# Patient Record
Sex: Male | Born: 1950
Health system: Southern US, Community
[De-identification: ages and names within clinical notes are randomized; demographics above are authoritative.]

## PROBLEM LIST (undated history)

## (undated) DIAGNOSIS — R600 Localized edema: Secondary | ICD-10-CM

## (undated) DIAGNOSIS — I959 Hypotension, unspecified: Secondary | ICD-10-CM

## (undated) DIAGNOSIS — J189 Pneumonia, unspecified organism: Secondary | ICD-10-CM

## (undated) DIAGNOSIS — R0602 Shortness of breath: Secondary | ICD-10-CM

## (undated) DIAGNOSIS — R32 Unspecified urinary incontinence: Secondary | ICD-10-CM

## (undated) DIAGNOSIS — I739 Peripheral vascular disease, unspecified: Secondary | ICD-10-CM

## (undated) DIAGNOSIS — F329 Major depressive disorder, single episode, unspecified: Secondary | ICD-10-CM

## (undated) DIAGNOSIS — R51 Headache: Secondary | ICD-10-CM

## (undated) DIAGNOSIS — Z9989 Dependence on other enabling machines and devices: Secondary | ICD-10-CM

## (undated) DIAGNOSIS — D62 Acute posthemorrhagic anemia: Secondary | ICD-10-CM

## (undated) DIAGNOSIS — F10239 Alcohol dependence with withdrawal, unspecified: Secondary | ICD-10-CM

## (undated) DIAGNOSIS — Z720 Tobacco use: Secondary | ICD-10-CM

## (undated) DIAGNOSIS — E669 Obesity, unspecified: Secondary | ICD-10-CM

## (undated) DIAGNOSIS — Z96659 Presence of unspecified artificial knee joint: Secondary | ICD-10-CM

## (undated) DIAGNOSIS — E8809 Other disorders of plasma-protein metabolism, not elsewhere classified: Secondary | ICD-10-CM

## (undated) DIAGNOSIS — E119 Type 2 diabetes mellitus without complications: Secondary | ICD-10-CM

## (undated) DIAGNOSIS — S02839A Fracture of medial orbital wall, unspecified side, initial encounter for closed fracture: Secondary | ICD-10-CM

## (undated) DIAGNOSIS — I509 Heart failure, unspecified: Secondary | ICD-10-CM

## (undated) DIAGNOSIS — I2510 Atherosclerotic heart disease of native coronary artery without angina pectoris: Secondary | ICD-10-CM

## (undated) DIAGNOSIS — E1169 Type 2 diabetes mellitus with other specified complication: Secondary | ICD-10-CM

## (undated) DIAGNOSIS — I5033 Acute on chronic diastolic (congestive) heart failure: Secondary | ICD-10-CM

## (undated) DIAGNOSIS — E875 Hyperkalemia: Secondary | ICD-10-CM

## (undated) DIAGNOSIS — I63411 Cerebral infarction due to embolism of right middle cerebral artery: Secondary | ICD-10-CM

## (undated) DIAGNOSIS — F0280 Dementia in other diseases classified elsewhere without behavioral disturbance: Secondary | ICD-10-CM

## (undated) DIAGNOSIS — I639 Cerebral infarction, unspecified: Secondary | ICD-10-CM

## (undated) DIAGNOSIS — Z86718 Personal history of other venous thrombosis and embolism: Secondary | ICD-10-CM

## (undated) DIAGNOSIS — M25512 Pain in left shoulder: Secondary | ICD-10-CM

## (undated) DIAGNOSIS — H05429 Enophthalmos due to trauma or surgery, unspecified eye: Secondary | ICD-10-CM

## (undated) DIAGNOSIS — I63231 Cerebral infarction due to unspecified occlusion or stenosis of right carotid arteries: Secondary | ICD-10-CM

## (undated) DIAGNOSIS — E46 Unspecified protein-calorie malnutrition: Secondary | ICD-10-CM

## (undated) DIAGNOSIS — H02059 Trichiasis without entropian unspecified eye, unspecified eyelid: Secondary | ICD-10-CM

## (undated) DIAGNOSIS — H029 Unspecified disorder of eyelid: Secondary | ICD-10-CM

## (undated) DIAGNOSIS — R569 Unspecified convulsions: Secondary | ICD-10-CM

## (undated) DIAGNOSIS — F419 Anxiety disorder, unspecified: Secondary | ICD-10-CM

## (undated) DIAGNOSIS — E785 Hyperlipidemia, unspecified: Secondary | ICD-10-CM

## (undated) DIAGNOSIS — K219 Gastro-esophageal reflux disease without esophagitis: Secondary | ICD-10-CM

## (undated) DIAGNOSIS — G4733 Obstructive sleep apnea (adult) (pediatric): Secondary | ICD-10-CM

## (undated) DIAGNOSIS — I10 Essential (primary) hypertension: Secondary | ICD-10-CM

## (undated) DIAGNOSIS — S0285XA Fracture of orbit, unspecified, initial encounter for closed fracture: Secondary | ICD-10-CM

## (undated) DIAGNOSIS — N179 Acute kidney failure, unspecified: Secondary | ICD-10-CM

## (undated) DIAGNOSIS — I6330 Cerebral infarction due to thrombosis of unspecified cerebral artery: Secondary | ICD-10-CM

## (undated) DIAGNOSIS — H5712 Ocular pain, left eye: Secondary | ICD-10-CM

## (undated) DIAGNOSIS — M1990 Unspecified osteoarthritis, unspecified site: Secondary | ICD-10-CM

## (undated) DIAGNOSIS — R0989 Other specified symptoms and signs involving the circulatory and respiratory systems: Secondary | ICD-10-CM

## (undated) DIAGNOSIS — G9340 Encephalopathy, unspecified: Secondary | ICD-10-CM

## (undated) DIAGNOSIS — I82409 Acute embolism and thrombosis of unspecified deep veins of unspecified lower extremity: Secondary | ICD-10-CM

## (undated) DIAGNOSIS — R0609 Other forms of dyspnea: Secondary | ICD-10-CM

## (undated) DIAGNOSIS — E1142 Type 2 diabetes mellitus with diabetic polyneuropathy: Secondary | ICD-10-CM

## (undated) DIAGNOSIS — S0230XA Fracture of orbital floor, unspecified side, initial encounter for closed fracture: Secondary | ICD-10-CM

## (undated) DIAGNOSIS — F32A Depression, unspecified: Secondary | ICD-10-CM

## (undated) DIAGNOSIS — H532 Diplopia: Secondary | ICD-10-CM

## (undated) DIAGNOSIS — I63511 Cerebral infarction due to unspecified occlusion or stenosis of right middle cerebral artery: Secondary | ICD-10-CM

## (undated) DIAGNOSIS — J449 Chronic obstructive pulmonary disease, unspecified: Secondary | ICD-10-CM

## (undated) DIAGNOSIS — D696 Thrombocytopenia, unspecified: Secondary | ICD-10-CM

## (undated) DIAGNOSIS — I219 Acute myocardial infarction, unspecified: Secondary | ICD-10-CM

## (undated) DIAGNOSIS — F1010 Alcohol abuse, uncomplicated: Secondary | ICD-10-CM

## (undated) HISTORY — DX: Peripheral vascular disease, unspecified: I73.9

## (undated) HISTORY — DX: Depression, unspecified: F32.A

## (undated) HISTORY — DX: Atherosclerotic heart disease of native coronary artery without angina pectoris: I25.10

## (undated) HISTORY — DX: Acute myocardial infarction, unspecified: I21.9

## (undated) HISTORY — PX: EYE SURGERY: SHX253

## (undated) HISTORY — DX: Personal history of other venous thrombosis and embolism: Z86.718

## (undated) HISTORY — DX: Tobacco use: Z72.0

## (undated) HISTORY — DX: Type 2 diabetes mellitus without complications: E11.9

## (undated) HISTORY — DX: Fracture of orbit, unspecified, initial encounter for closed fracture: S02.85XA

## (undated) HISTORY — DX: Gastro-esophageal reflux disease without esophagitis: K21.9

## (undated) HISTORY — DX: Essential (primary) hypertension: I10

## (undated) HISTORY — PX: ESOPHAGOGASTRIC FUNDOPLICATION: SHX405

## (undated) HISTORY — PX: TOTAL KNEE ARTHROPLASTY: SHX125

## (undated) HISTORY — DX: Obstructive sleep apnea (adult) (pediatric): G47.33

## (undated) HISTORY — DX: Chronic obstructive pulmonary disease, unspecified: J44.9

## (undated) HISTORY — DX: Alcohol abuse, uncomplicated: F10.10

## (undated) HISTORY — DX: Anxiety disorder, unspecified: F41.9

## (undated) HISTORY — PX: BACK SURGERY: SHX140

## (undated) HISTORY — DX: Hyperlipidemia, unspecified: E78.5

## (undated) HISTORY — DX: Major depressive disorder, single episode, unspecified: F32.9

## (undated) HISTORY — DX: Dependence on other enabling machines and devices: Z99.89

## (undated) HISTORY — PX: CHOLECYSTECTOMY: SHX55

## (undated) HISTORY — DX: Heart failure, unspecified: I50.9

## (undated) HISTORY — PX: JOINT REPLACEMENT: SHX530

## (undated) HISTORY — PX: OTHER SURGICAL HISTORY: SHX169

---

## 1998-02-09 DIAGNOSIS — I219 Acute myocardial infarction, unspecified: Secondary | ICD-10-CM

## 1998-02-09 HISTORY — DX: Acute myocardial infarction, unspecified: I21.9

## 2001-10-11 ENCOUNTER — Ambulatory Visit (HOSPITAL_COMMUNITY): Admission: RE | Admit: 2001-10-11 | Discharge: 2001-10-11 | Payer: Self-pay | Admitting: Internal Medicine

## 2001-10-27 ENCOUNTER — Ambulatory Visit (HOSPITAL_COMMUNITY): Admission: RE | Admit: 2001-10-27 | Discharge: 2001-10-27 | Payer: Self-pay | Admitting: Internal Medicine

## 2001-10-27 ENCOUNTER — Encounter: Payer: Self-pay | Admitting: Internal Medicine

## 2001-10-28 ENCOUNTER — Encounter: Payer: Self-pay | Admitting: Internal Medicine

## 2001-11-03 ENCOUNTER — Encounter: Payer: Self-pay | Admitting: Vascular Surgery

## 2001-11-07 ENCOUNTER — Ambulatory Visit (HOSPITAL_COMMUNITY): Admission: RE | Admit: 2001-11-07 | Discharge: 2001-11-07 | Payer: Self-pay | Admitting: Vascular Surgery

## 2001-11-29 ENCOUNTER — Ambulatory Visit (HOSPITAL_COMMUNITY): Admission: RE | Admit: 2001-11-29 | Discharge: 2001-11-29 | Payer: Self-pay | Admitting: Cardiovascular Disease

## 2001-11-29 ENCOUNTER — Encounter: Payer: Self-pay | Admitting: Cardiovascular Disease

## 2001-12-20 ENCOUNTER — Inpatient Hospital Stay (HOSPITAL_COMMUNITY): Admission: RE | Admit: 2001-12-20 | Discharge: 2001-12-25 | Payer: Self-pay | Admitting: Vascular Surgery

## 2001-12-20 ENCOUNTER — Encounter: Payer: Self-pay | Admitting: Vascular Surgery

## 2001-12-20 ENCOUNTER — Encounter (INDEPENDENT_AMBULATORY_CARE_PROVIDER_SITE_OTHER): Payer: Self-pay | Admitting: Specialist

## 2001-12-21 ENCOUNTER — Encounter: Payer: Self-pay | Admitting: Vascular Surgery

## 2002-02-08 ENCOUNTER — Encounter: Payer: Self-pay | Admitting: Emergency Medicine

## 2002-02-08 ENCOUNTER — Encounter: Payer: Self-pay | Admitting: Internal Medicine

## 2002-02-08 ENCOUNTER — Inpatient Hospital Stay (HOSPITAL_COMMUNITY): Admission: EM | Admit: 2002-02-08 | Discharge: 2002-02-10 | Payer: Self-pay

## 2002-02-10 ENCOUNTER — Inpatient Hospital Stay (HOSPITAL_COMMUNITY): Admission: EM | Admit: 2002-02-10 | Discharge: 2002-02-15 | Payer: Self-pay | Admitting: Psychiatry

## 2002-12-12 ENCOUNTER — Emergency Department (HOSPITAL_COMMUNITY): Admission: EM | Admit: 2002-12-12 | Discharge: 2002-12-13 | Payer: Self-pay | Admitting: Emergency Medicine

## 2003-07-29 ENCOUNTER — Emergency Department (HOSPITAL_COMMUNITY): Admission: EM | Admit: 2003-07-29 | Discharge: 2003-07-29 | Payer: Self-pay | Admitting: Emergency Medicine

## 2006-11-14 ENCOUNTER — Inpatient Hospital Stay (HOSPITAL_COMMUNITY): Admission: EM | Admit: 2006-11-14 | Discharge: 2006-11-16 | Payer: Self-pay | Admitting: Emergency Medicine

## 2007-10-15 ENCOUNTER — Emergency Department (HOSPITAL_COMMUNITY): Admission: EM | Admit: 2007-10-15 | Discharge: 2007-10-16 | Payer: Self-pay | Admitting: Emergency Medicine

## 2008-01-25 ENCOUNTER — Ambulatory Visit: Payer: Self-pay | Admitting: Psychiatry

## 2008-01-25 ENCOUNTER — Inpatient Hospital Stay (HOSPITAL_COMMUNITY): Admission: EM | Admit: 2008-01-25 | Discharge: 2008-02-01 | Payer: Self-pay | Admitting: Psychiatry

## 2008-01-25 ENCOUNTER — Encounter: Payer: Self-pay | Admitting: Emergency Medicine

## 2008-04-16 ENCOUNTER — Emergency Department (HOSPITAL_COMMUNITY): Admission: EM | Admit: 2008-04-16 | Discharge: 2008-04-16 | Payer: Self-pay | Admitting: Emergency Medicine

## 2008-08-16 IMAGING — CT CT ANGIO CHEST
3 of 6 series · 19 of 36 positions shown · IV contrast (APPLIED)
Comparison: None.

CLINICAL DATA: Chest pain.  
CT ANGIOGRAPHY OF CHEST:
TECHNIQUE: Multidetector CT imaging of the chest was performed during bolus injection of intravenous contrast.  Multiplanar CT angiographic image reconstructions were generated to evaluate the vascular anatomy.
Contrast:  80 ml Omnipaque 300

[Series 5: pulm embolism 2.0 b31f st · axial · 0.68mm/px · z∈[-312,-32]mm · 11 of 169 slices shown]
[im 15/169  lung]
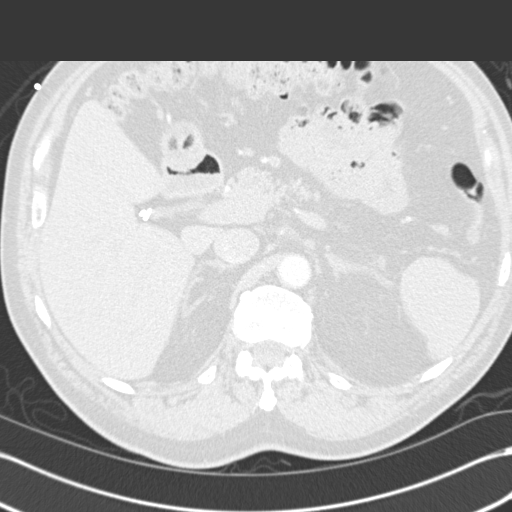
[im 29/169  mediastinal]
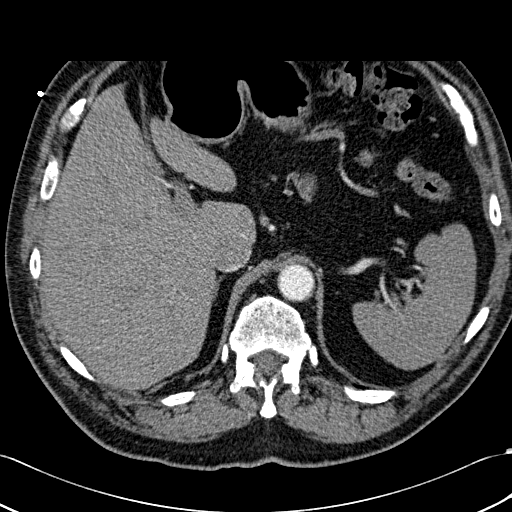
[im 43/169  lung]
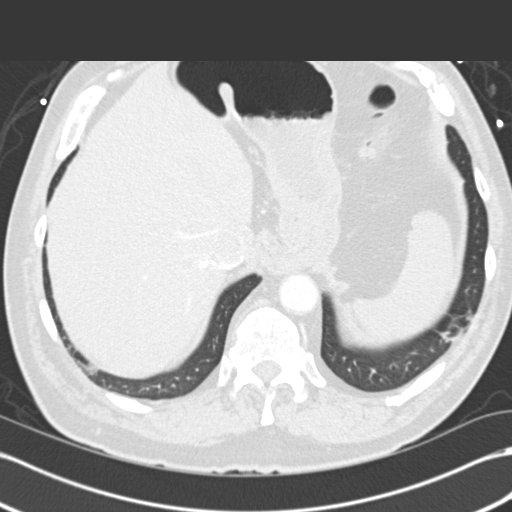
[im 57/169  mediastinal]
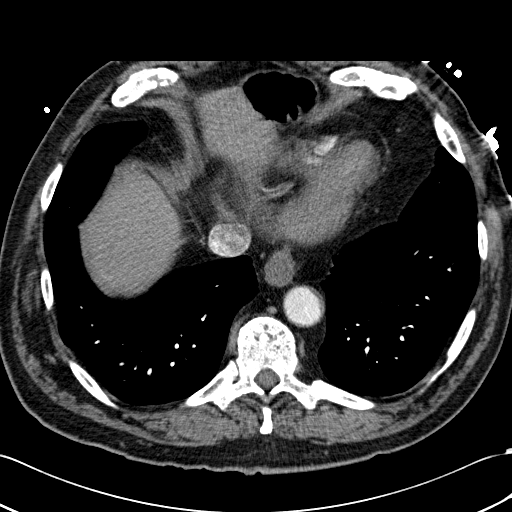
[im 71/169  lung]
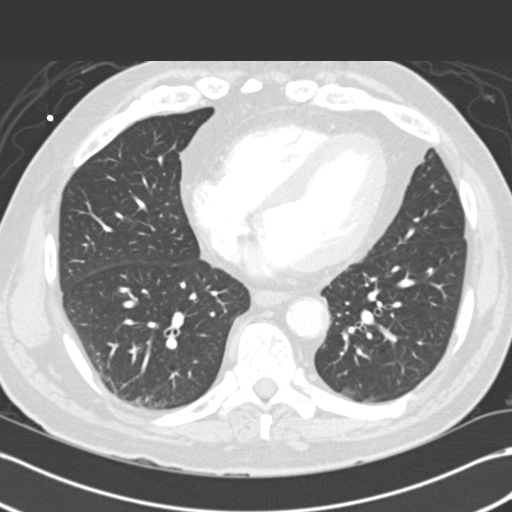
[im 85/169  mediastinal]
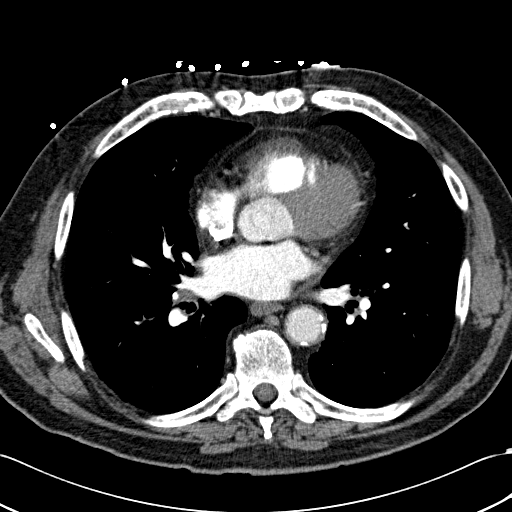
[im 99/169  lung]
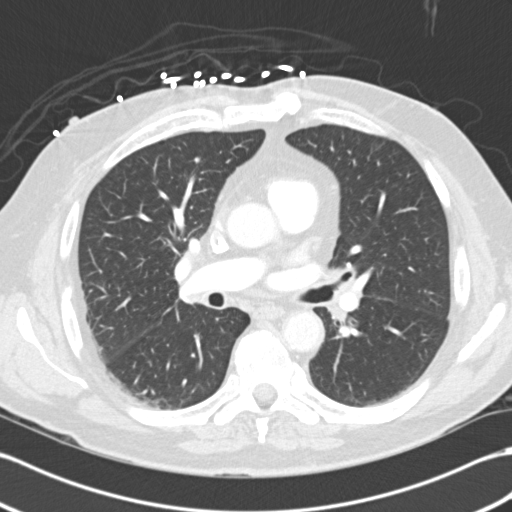
[im 113/169  mediastinal]
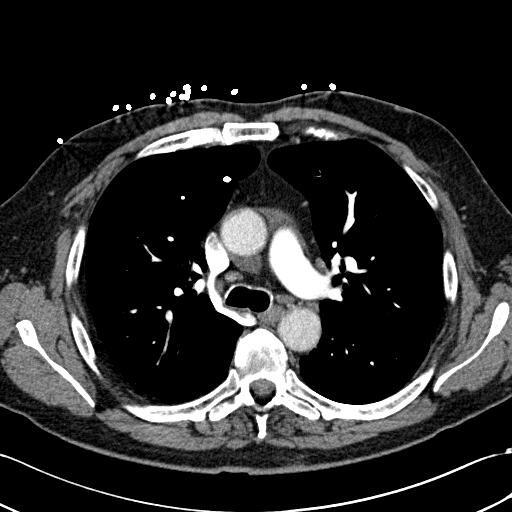
[im 127/169  lung]
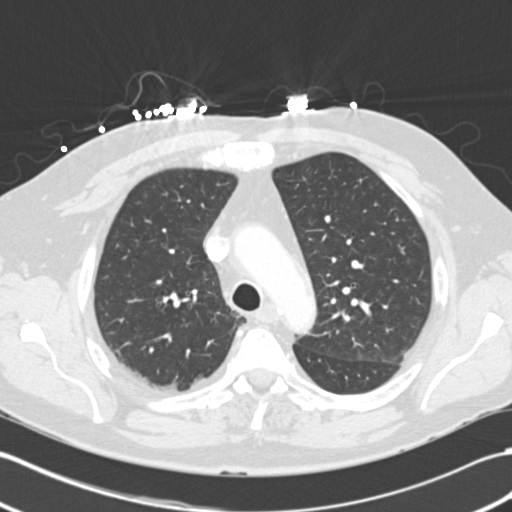
[im 141/169  mediastinal]
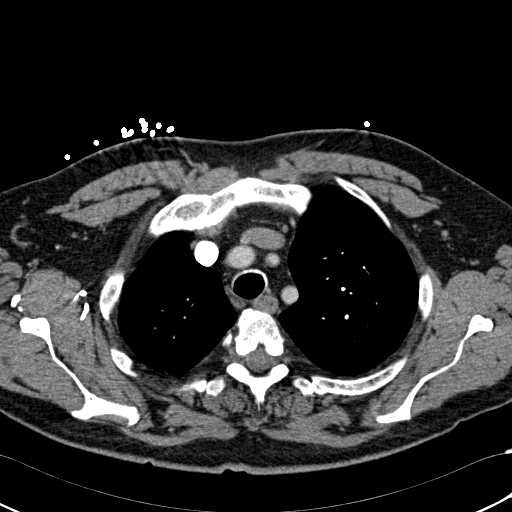
[im 155/169  lung]
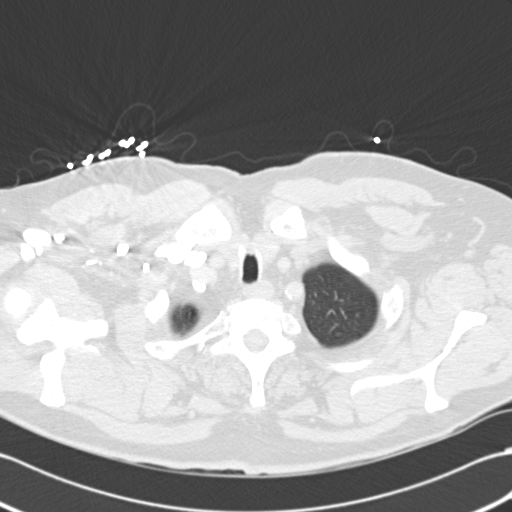

[Series 6: pulm embolism 2.0 b60f lung · axial · 0.68mm/px · z∈[-244,-124]mm · 5 of 136 slices shown]
[im 16/136  mediastinal]
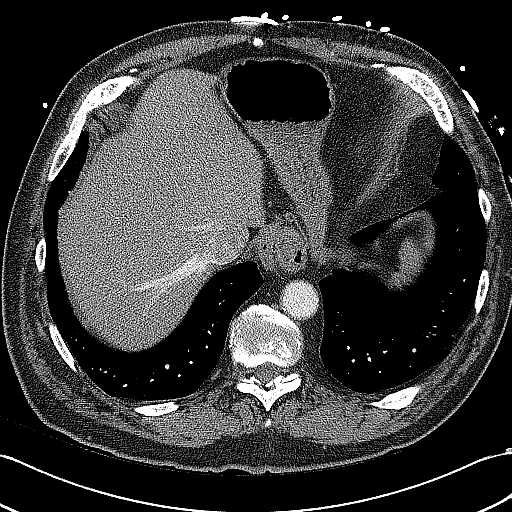
[im 31/136  mediastinal]
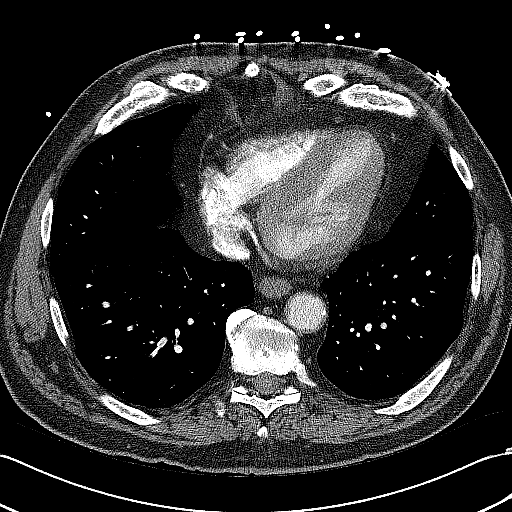
[im 46/136  mediastinal]
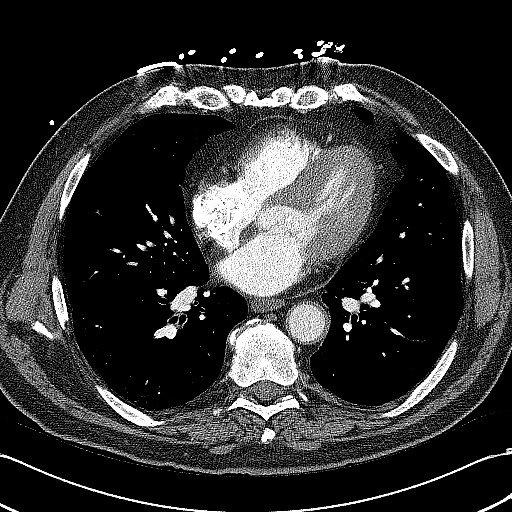
[im 61/136  mediastinal]
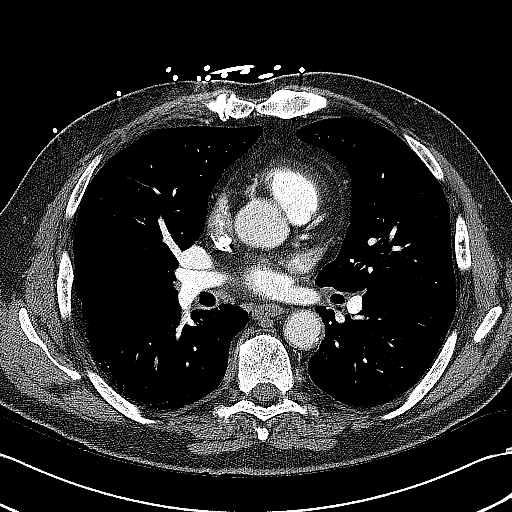
[im 76/136  mediastinal]
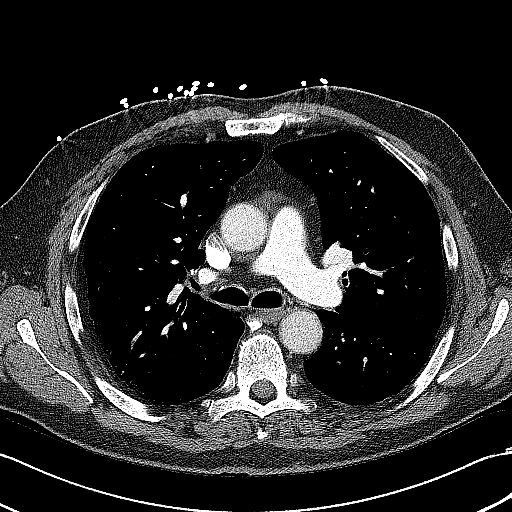

[Series 603: coronals · coronal · 0.68mm/px · 3 of 117 slices shown]
[im 24/117  mediastinal]
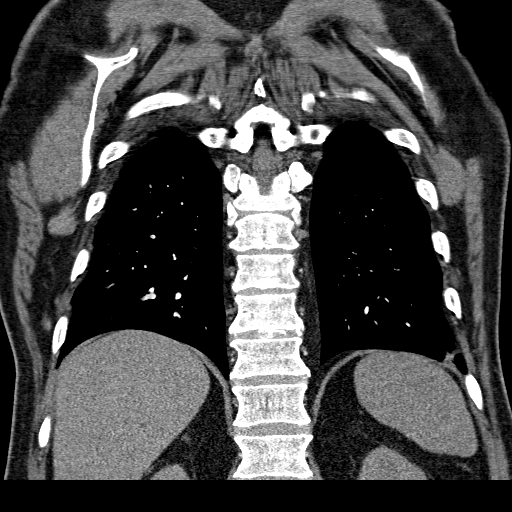
[im 47/117  mediastinal]
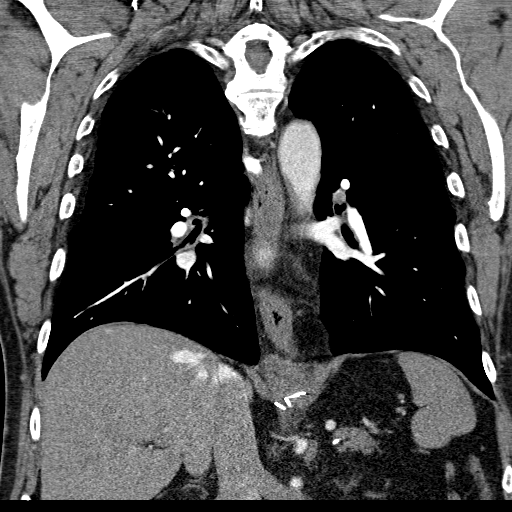
[im 70/117  mediastinal]
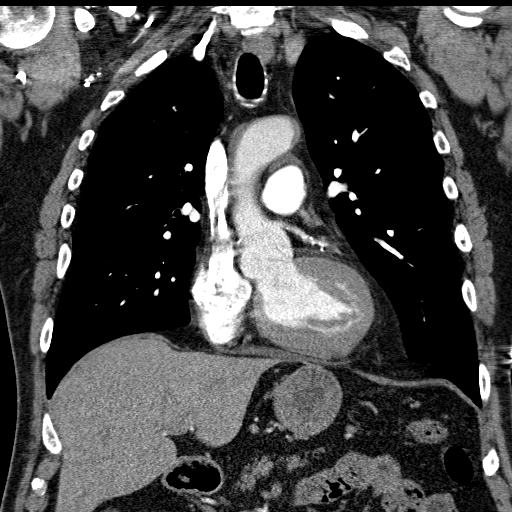

[19 of 36 positions shown; findings below may reference images not displayed]

FINDINGS: imaging of the upper abdomen demonstrates cholecystectomy.  No acute intraabdominal findings.  Aortic atherosclerosis is present.  Surgery of the gastroesophageal junction has been performed, with an esophageal wrap.  No axillary, hilar, or mediastinal adenopathy is identified.  Calcified lymph nodes are identified within the right hilum, and a large calcified granuloma is identified within the right upper lobe, all consistent with old granulomatous disease.  Aortic arch and branch vessel atherosclerosis.  No acute aortic findings are identified.  Prominence of the superior pericardial recess.  
This study is technically adequate to evaluate for pulmonary embolus and no pulmonary embolus is present.  Coronary artery atherosclerosis is present.  No pericardial or pleural effusion. 
Bone windows demonstrate no aggressive-appearing osteolytic or blastic lesions.  
Lung windows demonstrate dependent atelectasis.  Area of ill-defined nodularity or scar is identified in the left upper lobe on image 63 series 6, measuring 6 mm.  Scattered other sub 4 mm pulmonary nodules are identified.
IMPRESSION: 1.  Technically adequate study without pulmonary embolus. 
2.  Atherosclerosis and coronary artery disease, which could potentially represent a source of disease in the chest. 
3.  Ill-defined pulmonary nodule measuring 6 mm in the left upper lobe.  6-month followup recommended in patient with high-risk of malignancy or cigarette smoker with 12-month followup in low risk patient. 
4.  Old granulomatous disease.

## 2008-08-16 IMAGING — CR DG CHEST 1V PORT
1 series · 1 of 1 positions shown · non-contrast
Comparison: None available.

CLINICAL DATA: Chest pain.
 PORTABLE CHEST - 1 VIEW:

[view not recorded]
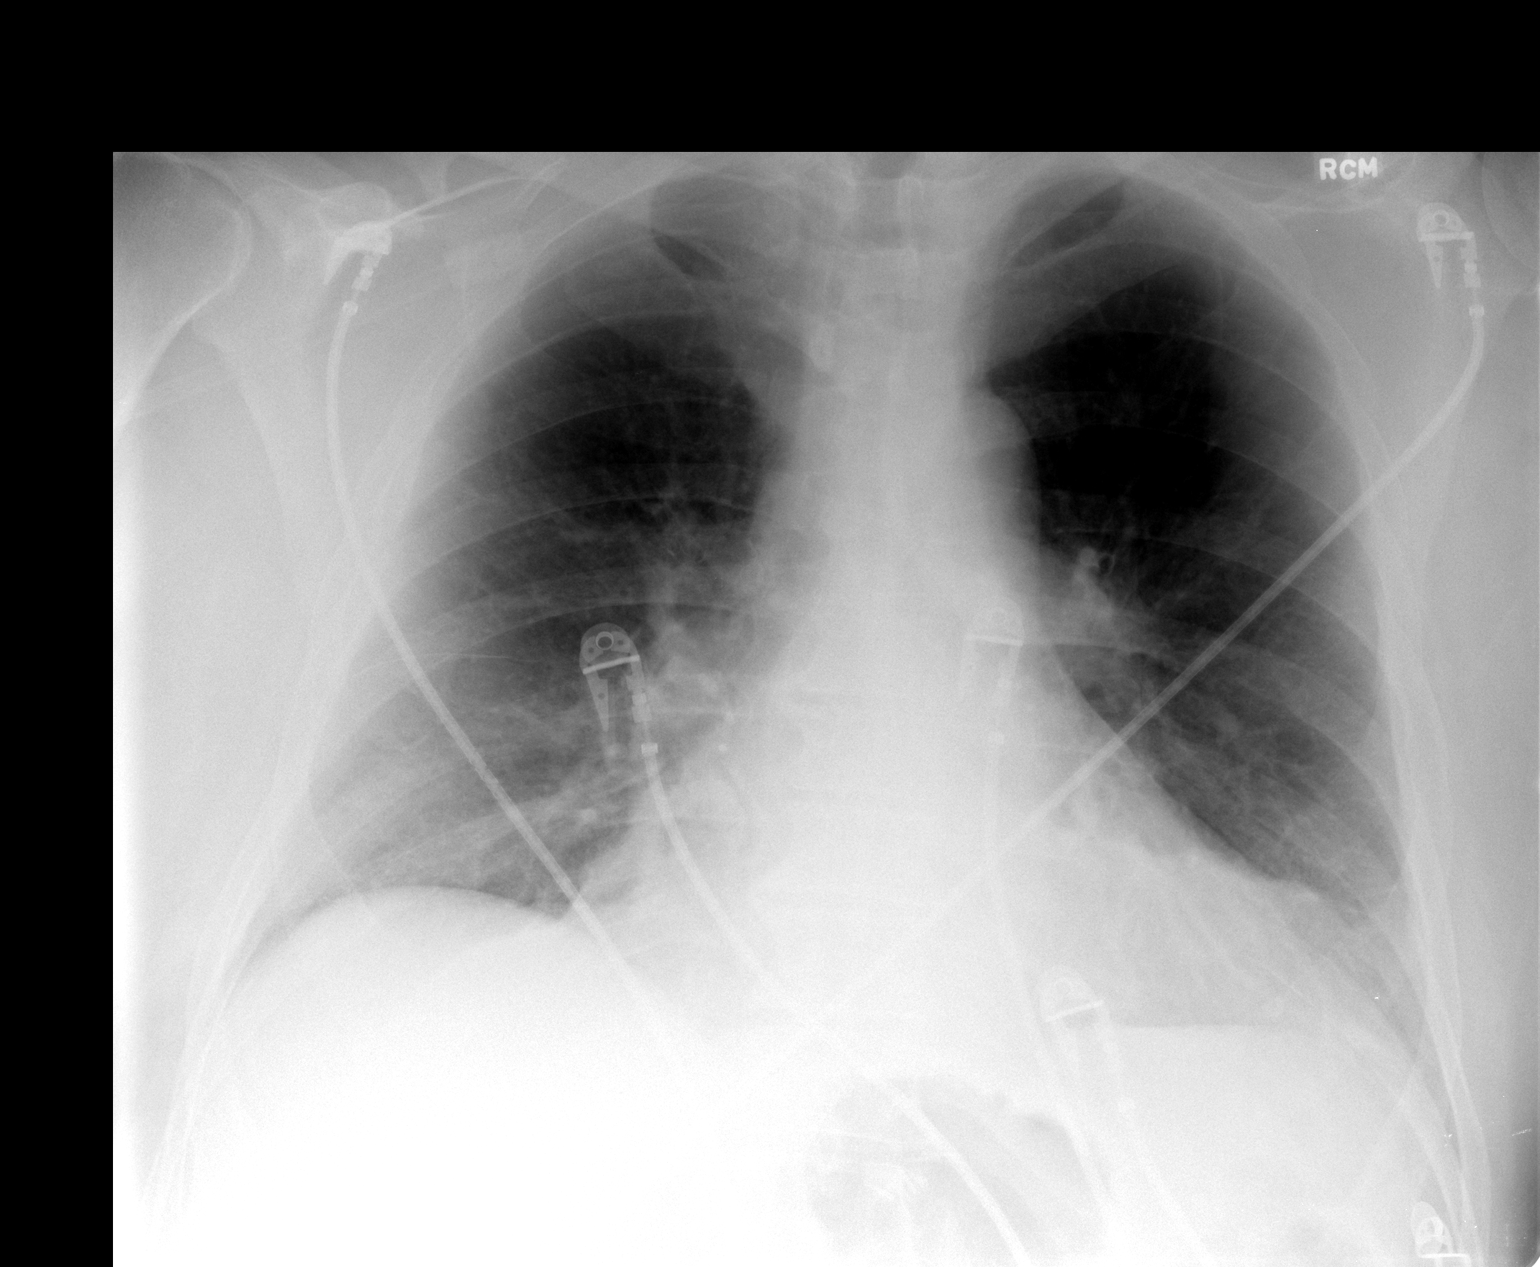

[1 of 1 positions shown; findings below may reference images not displayed]

FINDINGS: AP portable upright chest demonstrates cardiomegaly. Bibasilar atelectasis is present. No edema or focal airspace disease identified. Degenerative changes noted at the shoulders.
IMPRESSION: 1.  Left basilar atelectasis greater than right, without acute cardiopulmonary disease.
 2.  Cardiomegaly.

## 2009-07-18 IMAGING — CR DG CERVICAL SPINE COMPLETE 4+V
8 series · 8 of 8 positions shown · non-contrast
Comparison: None

CLINICAL DATA: Neck pain

CERVICAL SPINE - COMPLETE 4+ VIEW

[w c-spine lat *]
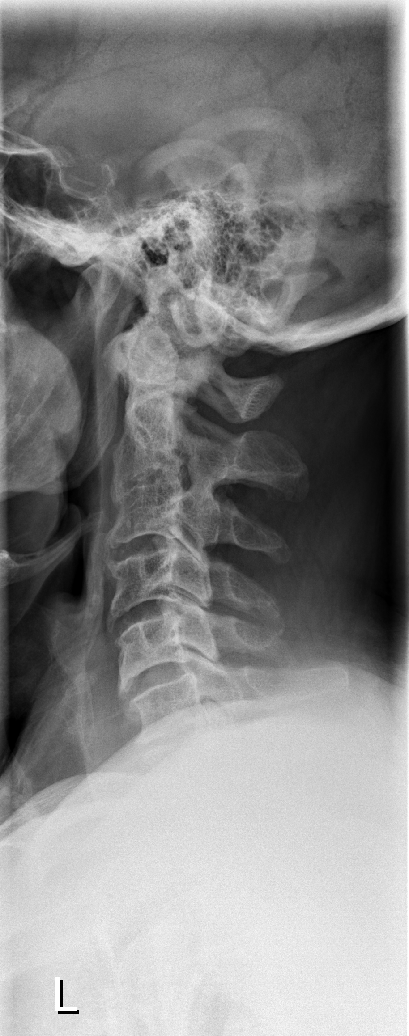

[w c-spine oblique * (1 of 2)]
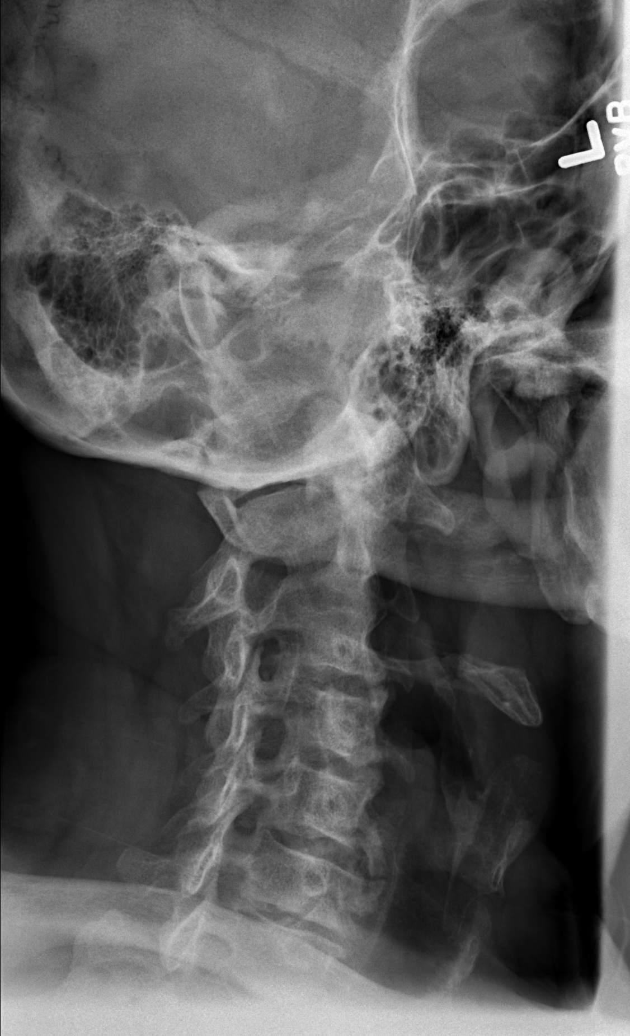

[w c-spine oblique * (2 of 2)]
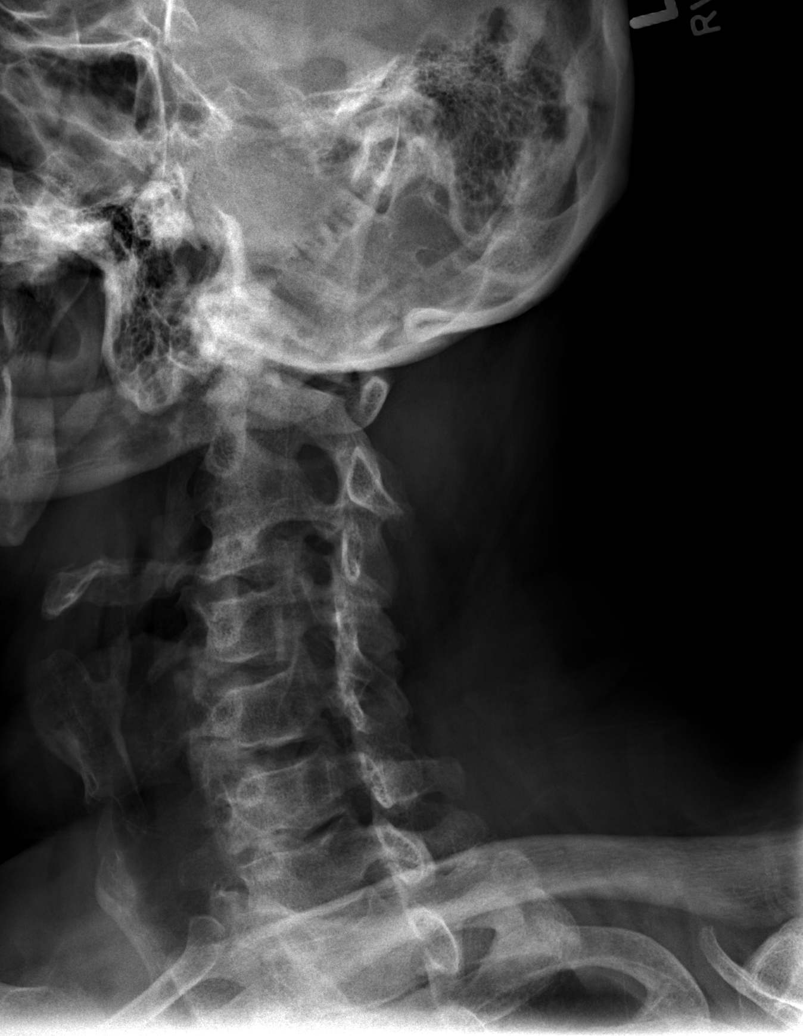

[w c-spine a.p. *]
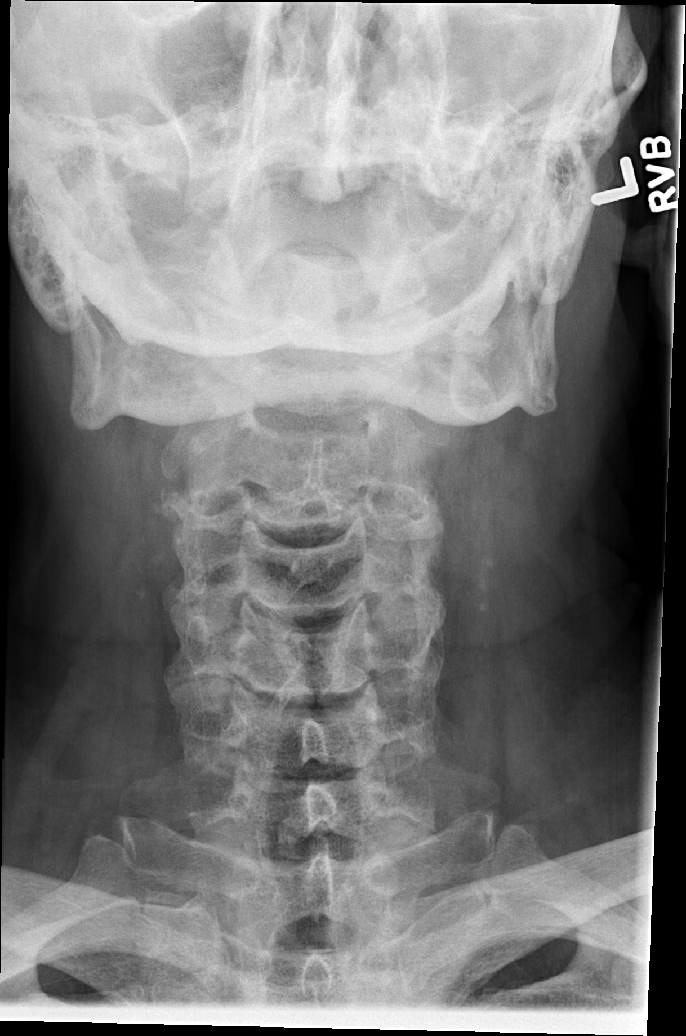

[w c-spine odontoid * (1 of 2)]
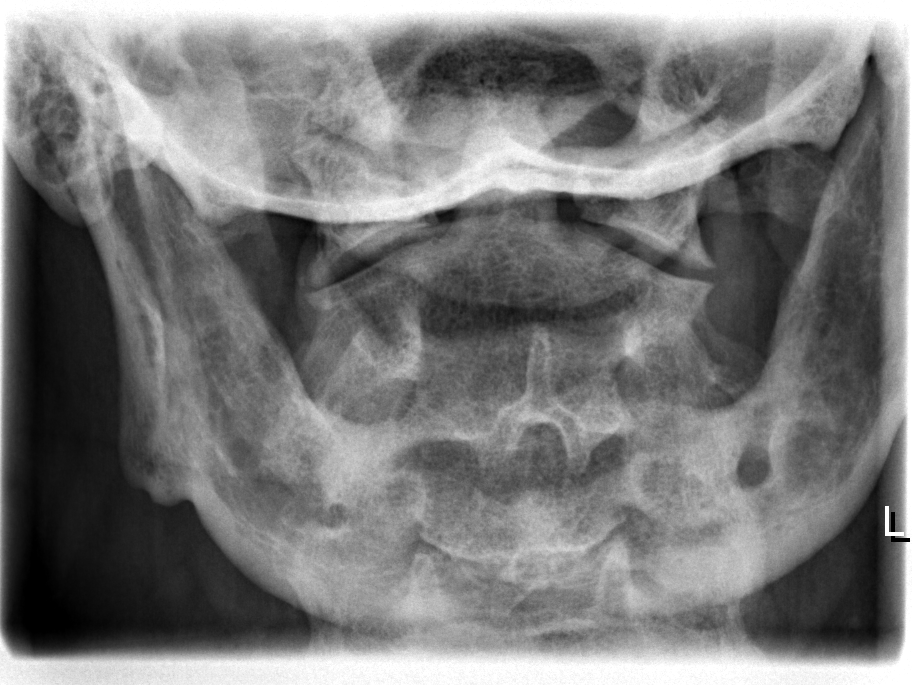

[w c-spine odontoid * (2 of 2)]
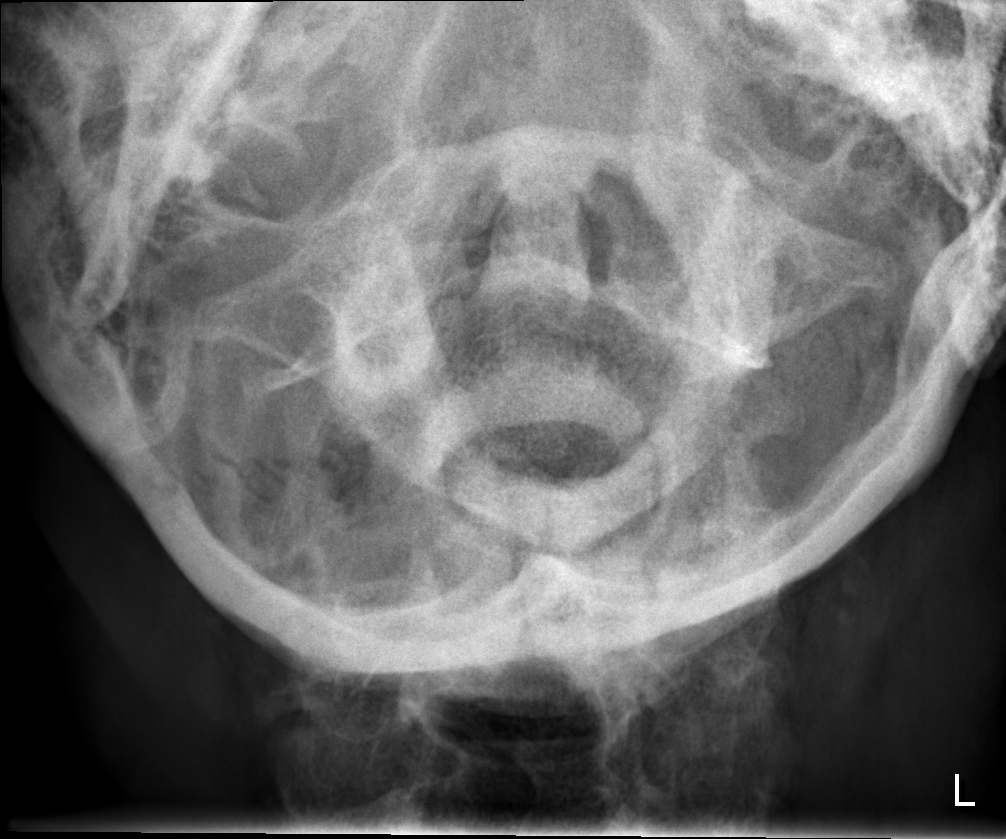

[w swimmers view * (1 of 2)]
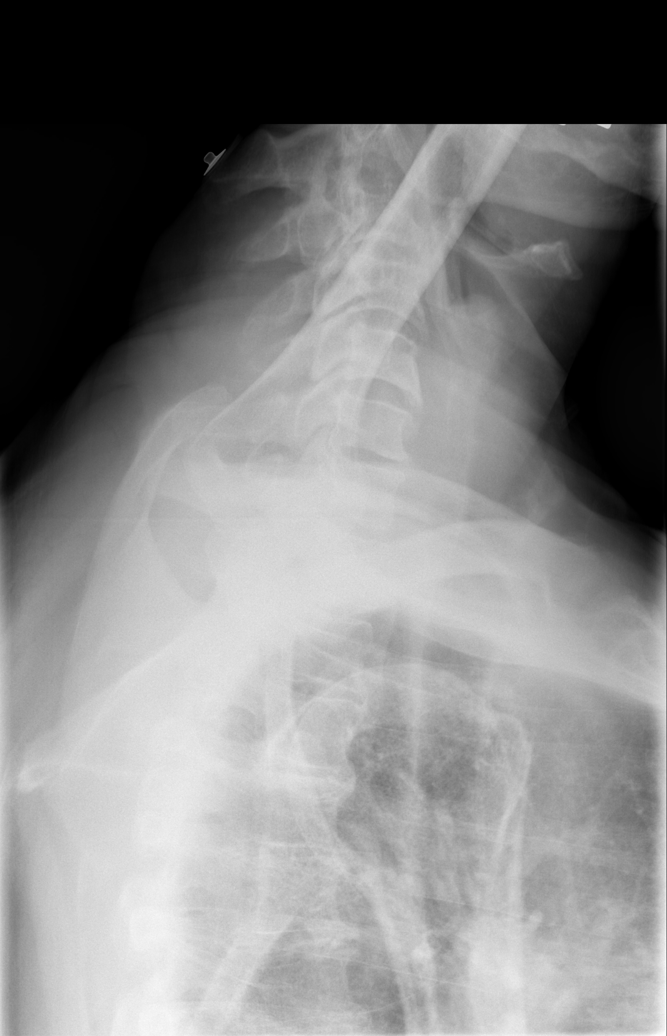

[w swimmers view * (2 of 2)]
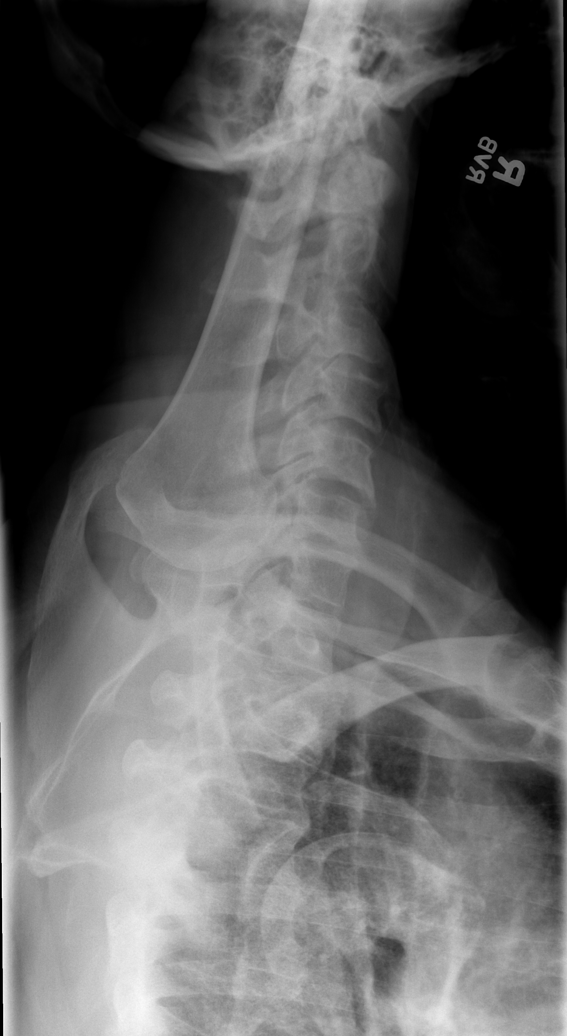

[8 of 8 positions shown; findings below may reference images not displayed]

FINDINGS: Alignment is normal.  There appears to be congenital
failure of separation of C2 and C3.  There is degenerative facet
arthropathy at C3-4 and C4-5.  There is mild foraminal encroachment
at those two levels due to osteophytes.  No severe degenerative
changes are noted.  There is moderate disc space narrowing at C6-7.
IMPRESSION: Congenital failure of separation of C2 and C3.  Facet arthropathy
at C3-4 and C4-5 and disc space narrowing at C6-7.

## 2009-08-13 ENCOUNTER — Emergency Department (HOSPITAL_COMMUNITY): Admission: EM | Admit: 2009-08-13 | Discharge: 2009-08-13 | Payer: Self-pay | Admitting: Emergency Medicine

## 2009-10-09 ENCOUNTER — Encounter: Admission: RE | Admit: 2009-10-09 | Discharge: 2009-10-09 | Payer: Self-pay | Admitting: Family Medicine

## 2009-10-28 IMAGING — CT CT HEAD W/O CM
1 of 2 series · 13 of 30 positions shown, 17 images · non-contrast
Comparison: None available.

CLINICAL DATA: Altered mental status.

CT HEAD WITHOUT CONTRAST
TECHNIQUE: Contiguous axial images were obtained from the base of
the skull through the vertex without contrast.

[Series 2: brain · axial · 0.47mm/px · z∈[+158,+290]mm · 13 of 32 slices shown, 17 images]
[im 3/32  brain]
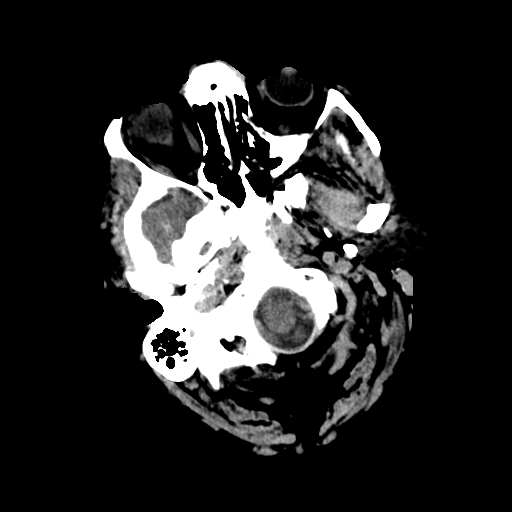
[im 3/32  bone]
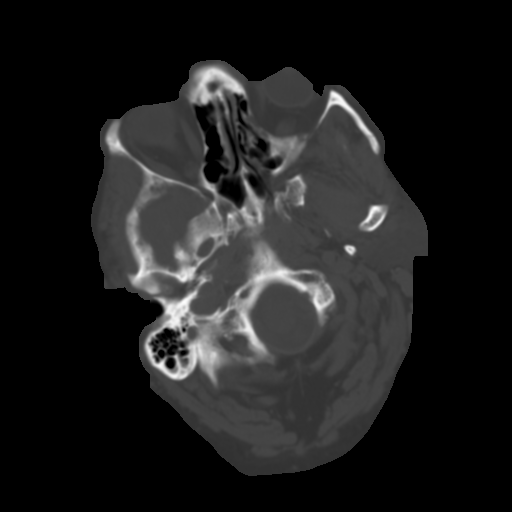
[im 5/32  brain]
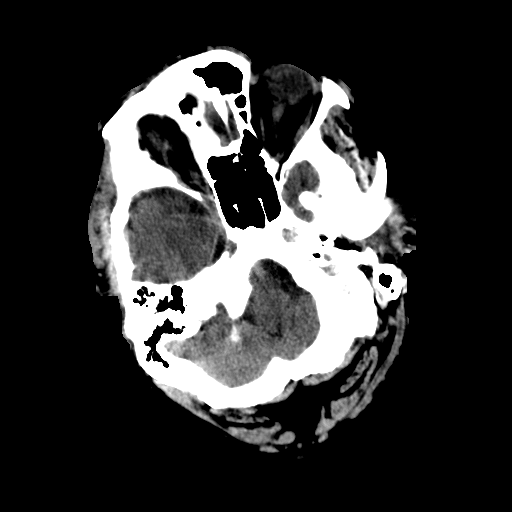
[im 7/32  brain]
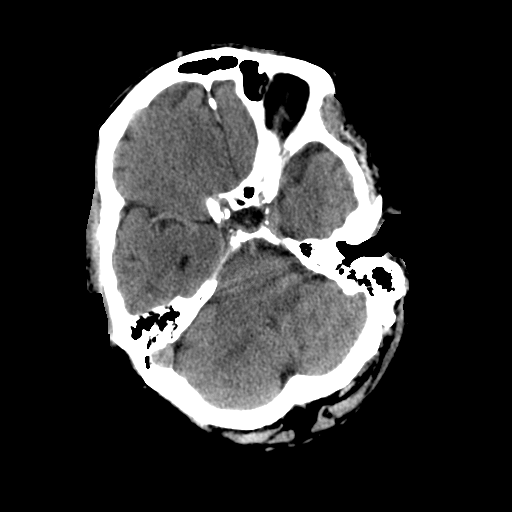
[im 9/32  brain]
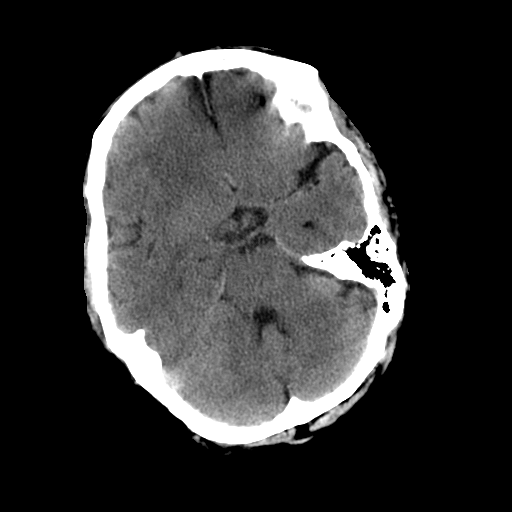
[im 12/32  brain]
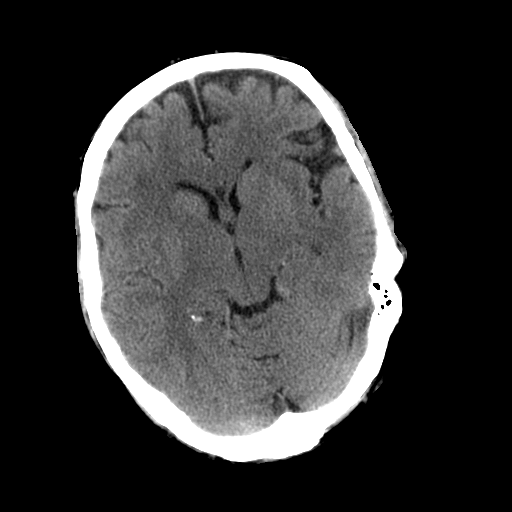
[im 12/32  bone]
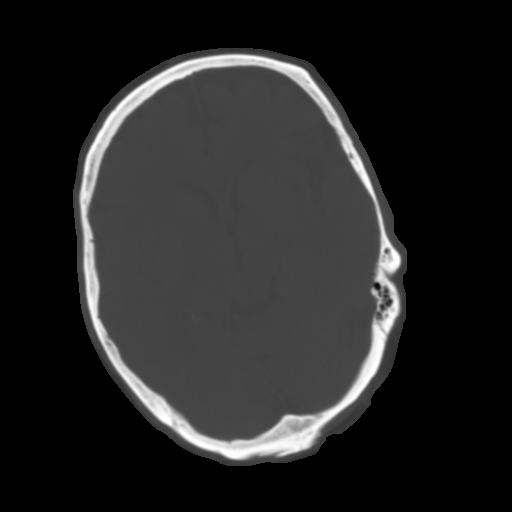
[im 14/32  brain]
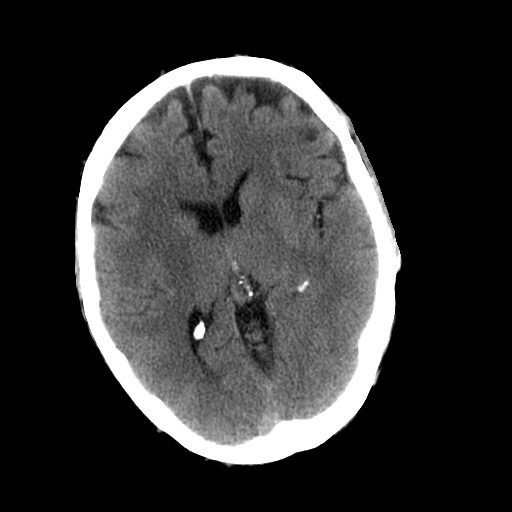
[im 16/32  brain]
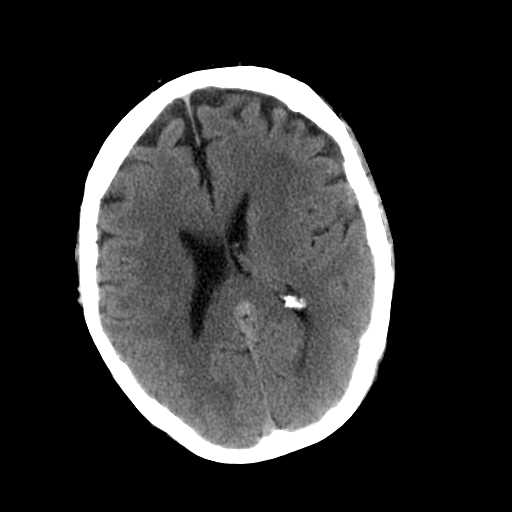
[im 18/32  brain]
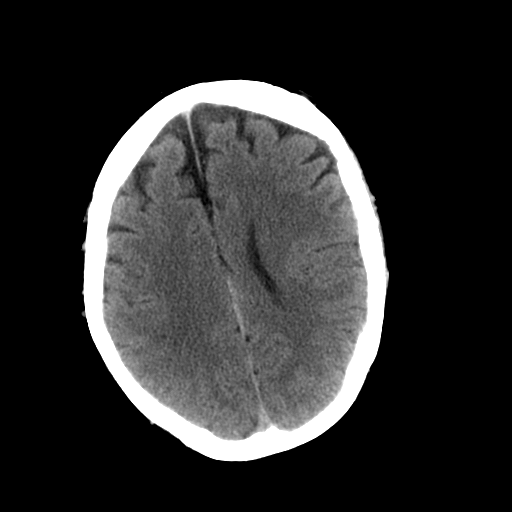
[im 20/32  brain]
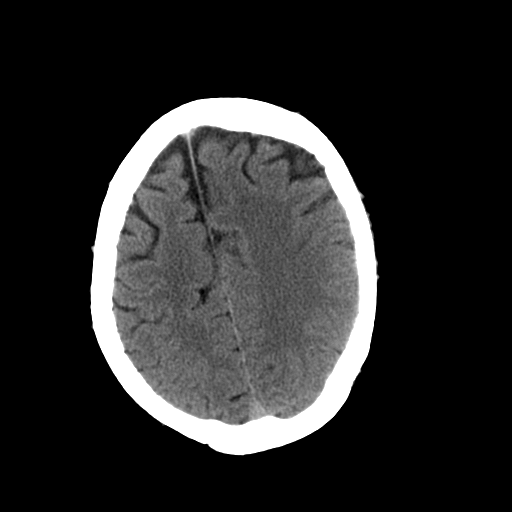
[im 20/32  bone]
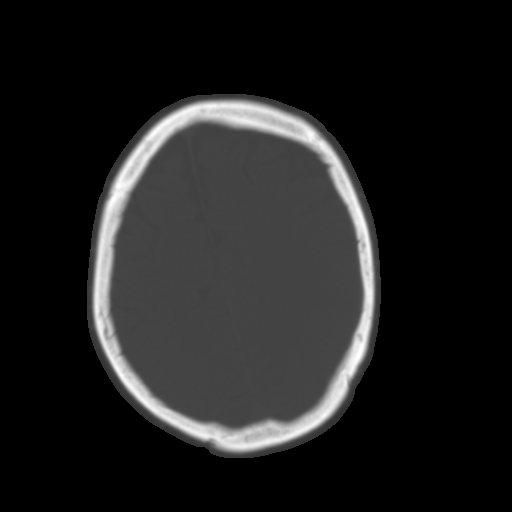
[im 23/32  brain]
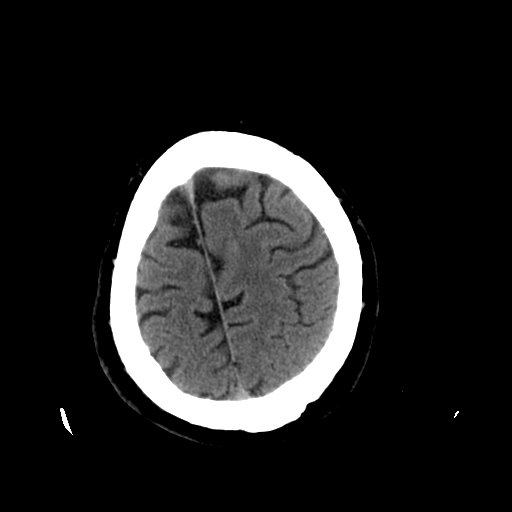
[im 25/32  brain]
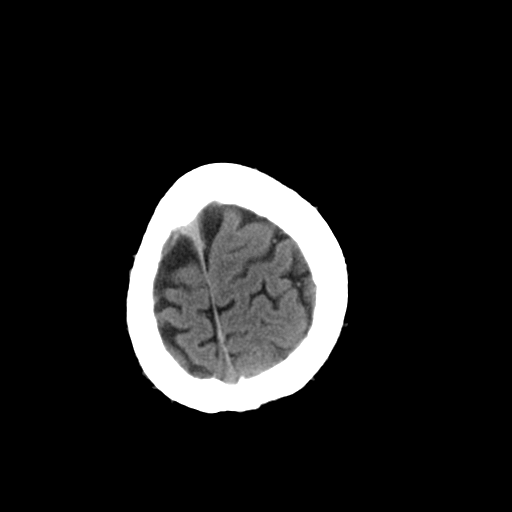
[im 27/32  brain]
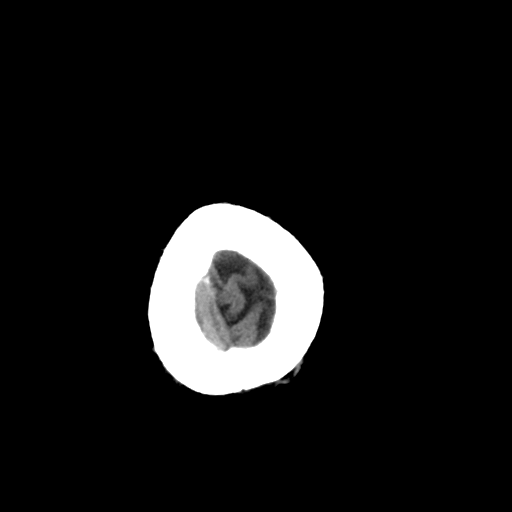
[im 29/32  brain]
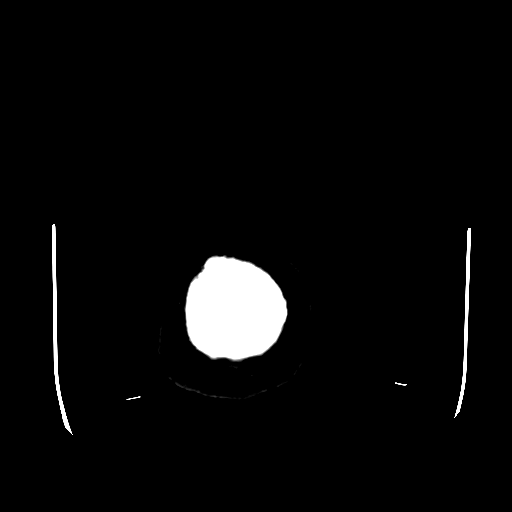
[im 29/32  bone]
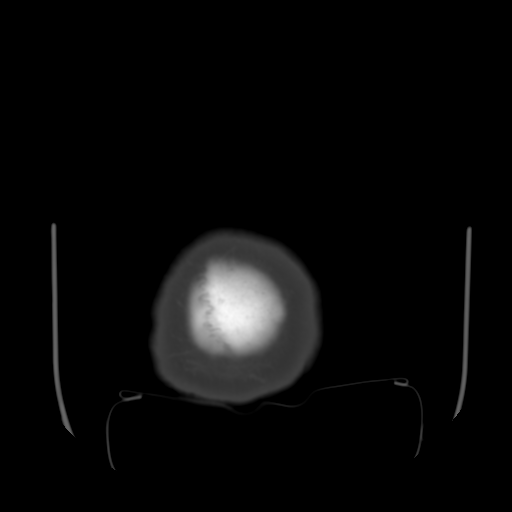

[13 of 30 positions shown; findings below may reference images not displayed]

FINDINGS: The brain demonstrates somewhat advanced for age cortical
atrophy.  No acute intracranial abnormality including hemorrhage,
infarct, mass, mass effect, midline shift or abnormal extra-axial
fluid collection is identified.  There is no hydrocephalus.

Dense opacification is seen the visualized left maxillary sinus
with sclerosis and thickening of the wall the sinus consistent with
chronic change.  The patient may be status post left maxillary
antrostomy.  Paranasal sinuses are otherwise clear.
IMPRESSION: 1.  No acute intracranial abnormality with some advance for age
cortical atrophy noted.
2.  Chronic left maxillary sinus disease. The patient appears to be
status post left maxillary antrostomy.

## 2010-01-18 IMAGING — CR DG CHEST 2V
2 series · 2 of 2 positions shown · non-contrast
Comparison: 01/25/2008

CLINICAL DATA: Cold/chest pain

CHEST - 2 VIEW

[w chest pa]
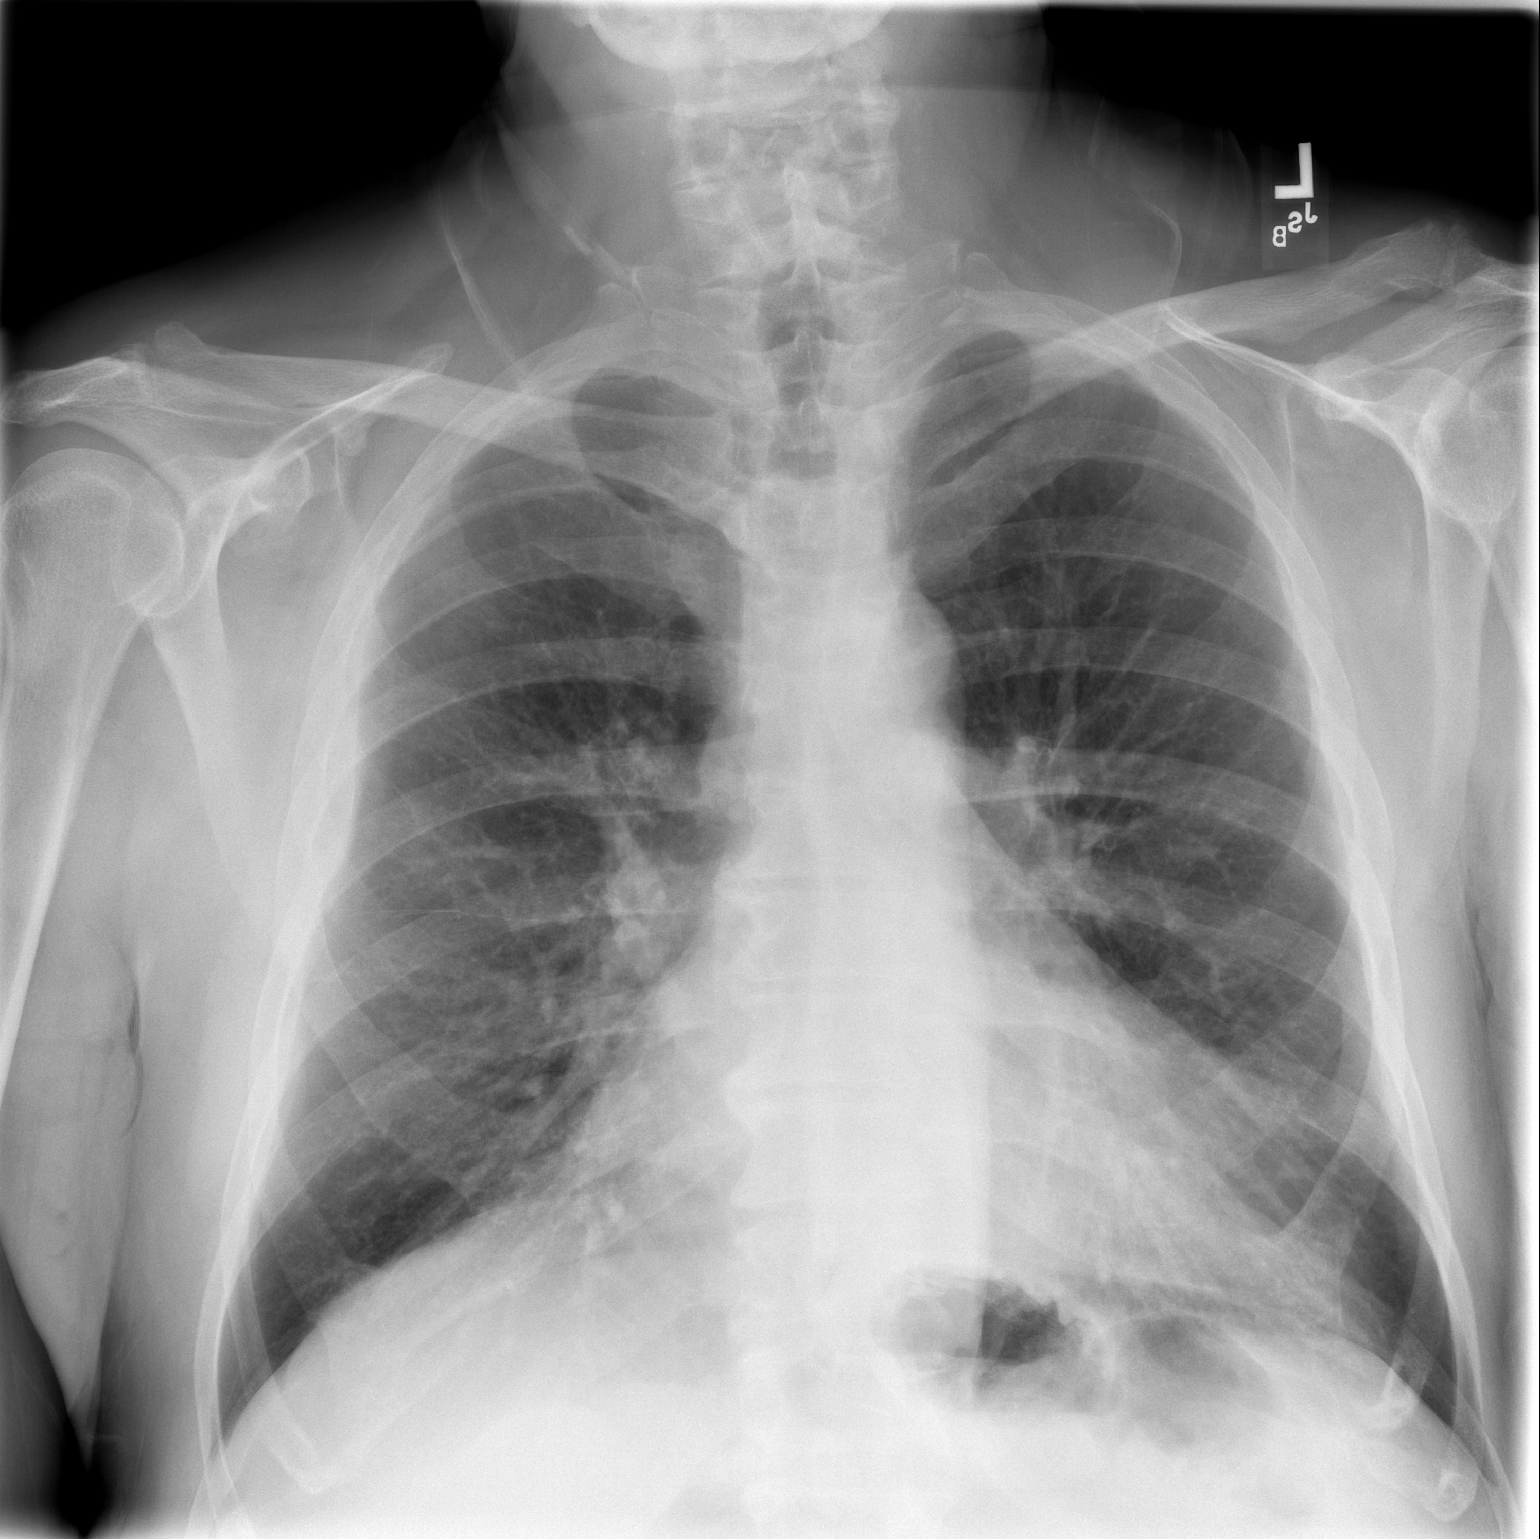

[w chest lat]
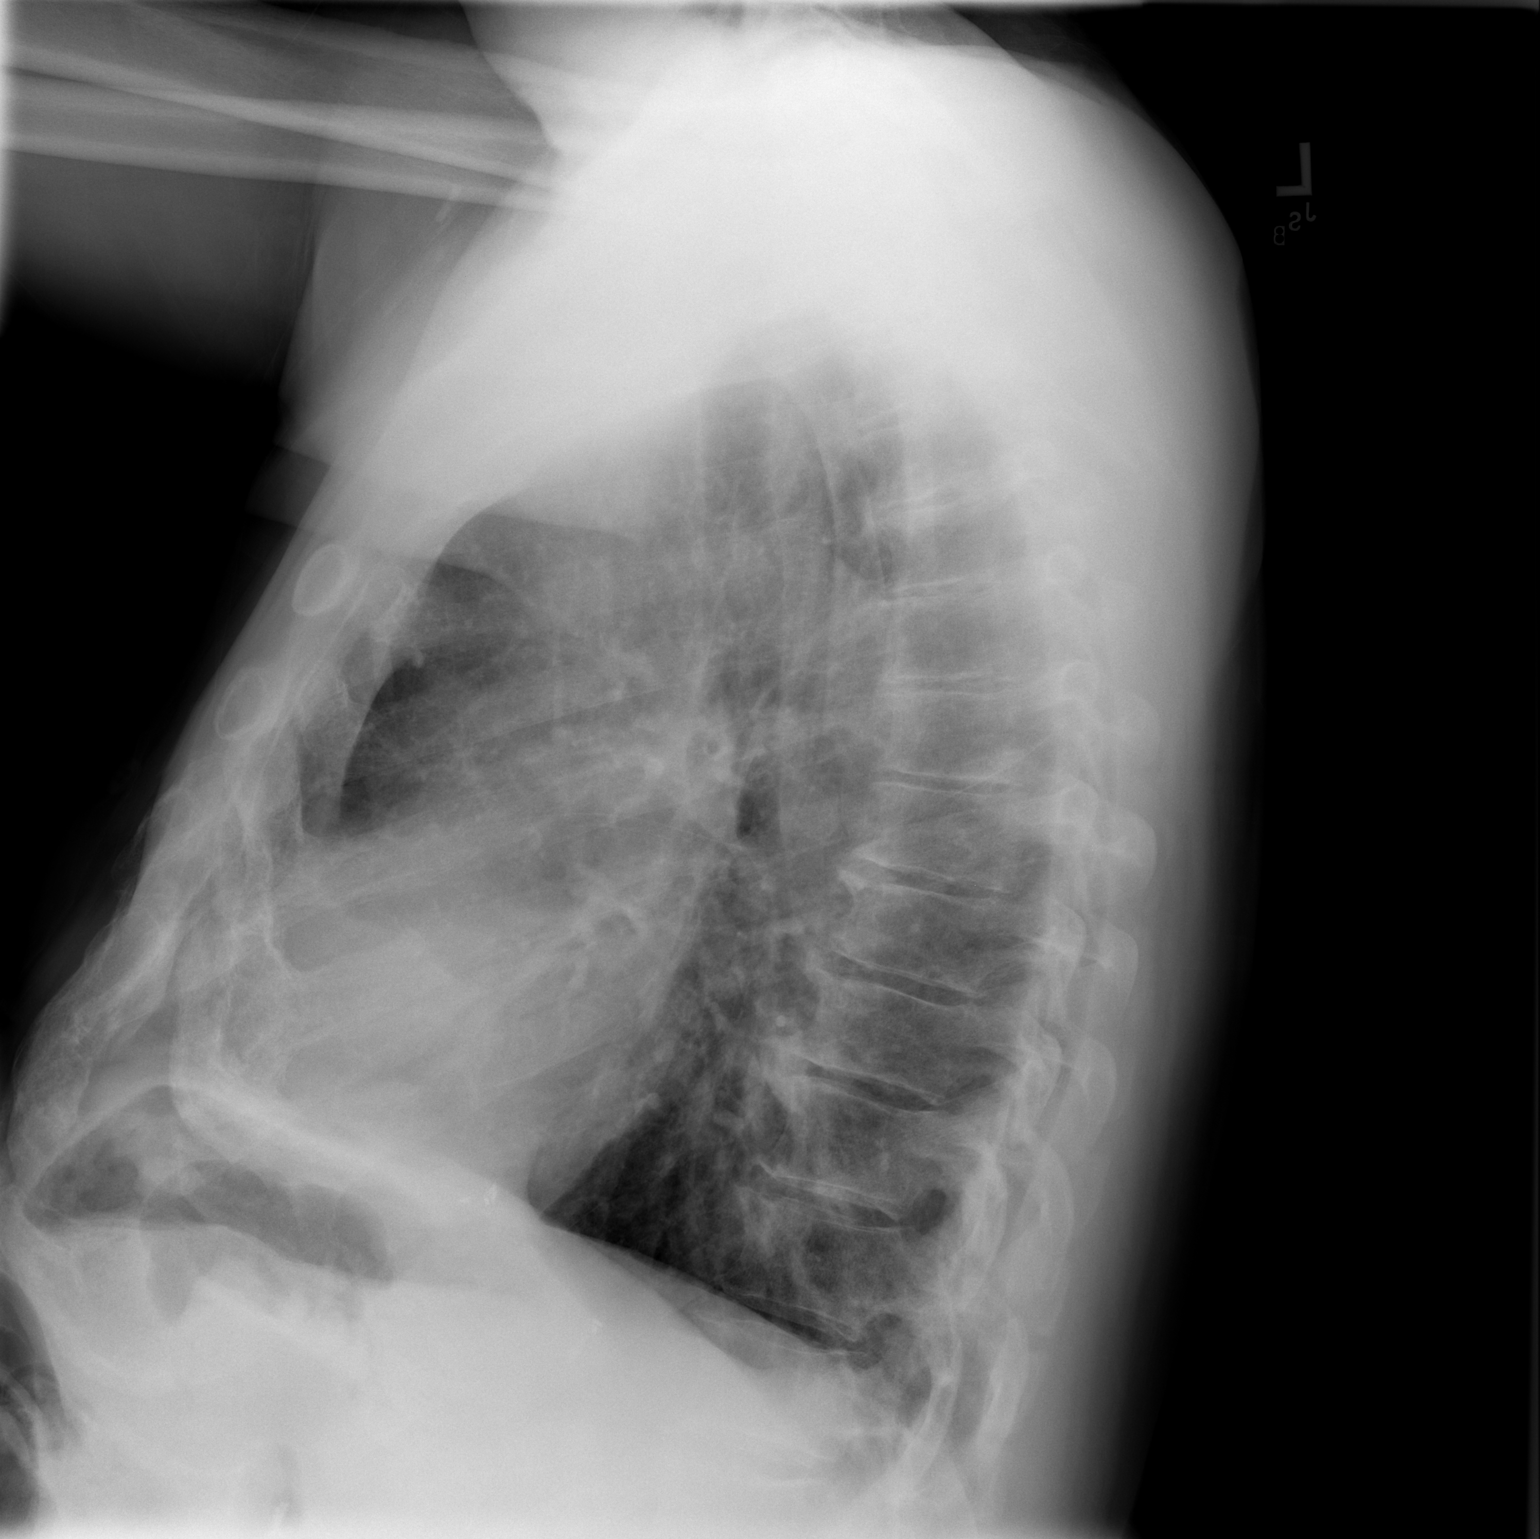

[2 of 2 positions shown; findings below may reference images not displayed]

FINDINGS: Borderline cardiomegaly without congestive heart failure
or pneumonia.  There is an element of COPD.  Osseous structures
intact.
IMPRESSION: Borderline cardiomegaly and COPD - no active disease.

## 2010-05-21 ENCOUNTER — Other Ambulatory Visit: Payer: Self-pay | Admitting: Cardiology

## 2010-05-21 ENCOUNTER — Ambulatory Visit
Admission: RE | Admit: 2010-05-21 | Discharge: 2010-05-21 | Disposition: A | Discharge status: Home / Self Care | Payer: Medicaid Other | Source: Ambulatory Visit | Attending: Cardiology | Admitting: Cardiology

## 2010-05-22 ENCOUNTER — Encounter: Payer: Medicaid Other | Attending: Family Medicine | Admitting: *Deleted

## 2010-05-22 DIAGNOSIS — E785 Hyperlipidemia, unspecified: Secondary | ICD-10-CM | POA: Insufficient documentation

## 2010-05-22 DIAGNOSIS — E669 Obesity, unspecified: Secondary | ICD-10-CM | POA: Insufficient documentation

## 2010-05-22 DIAGNOSIS — Z713 Dietary counseling and surveillance: Secondary | ICD-10-CM | POA: Insufficient documentation

## 2010-05-22 LAB — DIFFERENTIAL
Basophils Absolute: 0 10*3/uL (ref 0.0–0.1)
Lymphocytes Relative: 37 % (ref 12–46)
Lymphs Abs: 1.9 10*3/uL (ref 0.7–4.0)
Monocytes Relative: 13 % — ABNORMAL HIGH (ref 3–12)
Neutro Abs: 2.5 10*3/uL (ref 1.7–7.7)
Neutrophils Relative %: 48 % (ref 43–77)

## 2010-05-22 LAB — BASIC METABOLIC PANEL
GFR calc Af Amer: >60 mL/min (ref >60)
Glucose, Bld: 95 mg/dL (ref 70–99)

## 2010-05-22 LAB — CBC
MCHC: 35 g/dL (ref 30.0–36.0)
RDW: 13.3 % (ref 11.5–15.5)

## 2010-05-22 LAB — BRAIN NATRIURETIC PEPTIDE: Pro B Natriuretic peptide (BNP): <30 pg/mL (ref 0.0–100.0)

## 2010-05-27 ENCOUNTER — Ambulatory Visit (HOSPITAL_COMMUNITY)
Admission: RE | Admit: 2010-05-27 | Discharge: 2010-05-27 | Disposition: A | Discharge status: Home / Self Care | Payer: Medicaid Other | Source: Ambulatory Visit | Attending: Cardiology | Admitting: Cardiology

## 2010-05-27 DIAGNOSIS — J449 Chronic obstructive pulmonary disease, unspecified: Secondary | ICD-10-CM | POA: Insufficient documentation

## 2010-05-27 DIAGNOSIS — I2510 Atherosclerotic heart disease of native coronary artery without angina pectoris: Secondary | ICD-10-CM | POA: Insufficient documentation

## 2010-05-27 DIAGNOSIS — E785 Hyperlipidemia, unspecified: Secondary | ICD-10-CM | POA: Insufficient documentation

## 2010-05-27 DIAGNOSIS — Z79899 Other long term (current) drug therapy: Secondary | ICD-10-CM | POA: Insufficient documentation

## 2010-05-27 DIAGNOSIS — Z7901 Long term (current) use of anticoagulants: Secondary | ICD-10-CM | POA: Insufficient documentation

## 2010-05-27 DIAGNOSIS — Z86718 Personal history of other venous thrombosis and embolism: Secondary | ICD-10-CM | POA: Insufficient documentation

## 2010-05-27 DIAGNOSIS — I10 Essential (primary) hypertension: Secondary | ICD-10-CM | POA: Insufficient documentation

## 2010-05-27 DIAGNOSIS — I70219 Atherosclerosis of native arteries of extremities with intermittent claudication, unspecified extremity: Secondary | ICD-10-CM | POA: Insufficient documentation

## 2010-05-27 DIAGNOSIS — J4489 Other specified chronic obstructive pulmonary disease: Secondary | ICD-10-CM | POA: Insufficient documentation

## 2010-05-27 HISTORY — DX: Atherosclerotic heart disease of native coronary artery without angina pectoris: I25.10

## 2010-05-27 HISTORY — PX: OTHER SURGICAL HISTORY: SHX169

## 2010-05-27 HISTORY — PX: CARDIAC CATHETERIZATION: SHX172

## 2010-05-27 LAB — PROTIME-INR: Prothrombin Time: 13.7 seconds (ref 11.6–15.2)

## 2010-05-27 LAB — GLUCOSE, CAPILLARY
Glucose-Capillary: 125 mg/dL — ABNORMAL HIGH (ref 70–99)
Glucose-Capillary: 115 mg/dL — ABNORMAL HIGH (ref 70–99)

## 2010-05-27 LAB — APTT: aPTT: 33 seconds (ref 24–37)

## 2010-05-29 NOTE — Cardiovascular Report (Signed)
NAME:  [name], [name] NO.:  [id number]  MEDICAL RECORD NO.:  [id number]           PATIENT TYPE:  O  LOCATION:  [facility]                         FACILITY:  [facility]  PHYSICIAN:  Kenneth Corporal, MD DATE OF BIRTH:  12-13-1950  DATE OF PROCEDURE:  05/27/2010 DATE OF DISCHARGE:  05/27/2010                           CARDIAC CATHETERIZATION   PRIMARY CARDIOLOGIST:  Kenneth Corporal, MD  PRIMARY CARE PHYSICIAN:  Kenneth Harries, MD.  PROCEDURE PERFORMED: 1. Left heart catheterization via 5-French right femoral artery     access. 2. Selective native coronary angiography. 3. Abdominal aorta and bifemoral angiography with bilateral runoff. 4. Bilateral Lower Extremity Iliofemoral Angiography with runoff:      a.     Selective Right Iliofemoral and runoff Angiography via the sheath.     b.     Selective Left Iliofemoral Angiography and runoff via 2nd order catheter placement in the left common iliac limb of the Aortobifem graft.  INDICATIONS: 1. Anginal chest pain with positive stress test. 2. Severe bilateral claudication with known PAD.  BRIEF HISTORY:  [name] is a 60 year old gentleman, long-time smoker with hypertension, hyperlipidemia, obesity who to my knowledge has had past history of left fem-pop bypass and then either a right to left fem-fem versus aortobifemoral bypass surgery performed in the distant past.  The patient is not a good historian, he was not able to tell me which one of these two he had had.  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  The patient was seen in our clinic a year ago and was scheduled to have repeat Dopplers this year as well as had stress test back in August of last year.  The stress test actually showed basilar mid inferolateral ischemia to a mild extent.  The Doppler showed significant both left and right SFA disease with occlusion of the left SFA, occlusion of the fem-pop  graft. Unfortunately, they were unable to visualize the abdominal aorta and iliacs.  Based on the extent of his claudication being the right buttock, thigh, and calf as well as left buttock and calf as well as his angina, decision was made to proceed with a diagnostic catheterization with coronary angiography plus lower extremity angiography.  The risks, benefits, alternatives, and indications of procedure were explained to the patient in detail and informed consent was obtained with the signed form placed on the chart.  PROCEDURE:  The patient was brought to the [facility].  He was prepped and draped in the usual sterile fashion.  He came from the short-stay in the fasting state.  After a time-out period was performed, the right femoral head was localized using tactile and fluoroscopic guidance.  The right groin was anesthetized using 1% subcutaneous lidocaine and the presumed right common femoral artery was accessed using the modified Seldinger technique starting with a 5-French micropuncture kit.  Initially, the micropuncture wire met some resistance and was then advanced into the presumed lumen via fluoroscopic guidance.  The needle was exchanged for a 5-French micropuncture dilator.  With a dilator in  place and the obturator out, a brief hand injection was performed, demonstrated that it was truly in the lumen.  The micropuncture dilator was then exchanged over a Versacore wire, which was advanced into the aorta; however, not through the visualized stents in the bilateral iliacs that had not been previously known to have been present.  That was clearly in the lumen of the aorta, the wire went into the left renal artery.  With confirmation of the wire going up into the thoracic aorta, the micropuncture sheath was then exchanged for a 5-French standard sheath.  As the wire was already in the thoracic aorta, decision was made to  proceed with the coronary angiography portion of the procedure.  First, a 5-French JR4 catheter was advanced over a wire and easily engaged to the right coronary artery.  Multiple angiographic views of the right coronary system were obtained.  The catheter was then used to cross the aortic valve with a wire guidance for measuring left ventricular hemodynamics and pullback gradient across the aortic valve.  The left ventriculogram was not performed as the patient just had an echocardiogram done and the EDP was noted to be anywhere from 20 to 30 mmHg and the patient was starting to require supplemental oxygen and there was already going to be lots of contrast used for the peripheral angiography.  After the left ventricle hemodynamics were performed, the JR4 catheter was then exchanged over a wire for a 5-French JL4 catheter.  Multiple angiographic views of the left coronary system were obtained.  This catheter was then exchanged for a pigtail angiogram catheter, which was placed in the infradiaphragmatic location and confirmed fluoroscopically to be just above the renal arteries bilaterally.  The aortobifemoral angiography was then performed using digital subtraction with 20 mm of contrast over 20 seconds to visualizing what was then apparent to be an aortobifemoral bypass with bilateral patent lumens.  The catheter was then pulled back to the carina of the bypass graft.  Selective left and right iliofemoral angiograms were performed in the oblique angles.  Due to the patient's size and body habitus, I was not able to get both legs in the visual field especially with the presence of a right knee prosthesis.  Decision was made to proceed with unilateral lower extremity angiography.  The pigtail catheter was exchanged over the Versacore wire for a short Omniflush catheter, which was then pulled down into the left common iliac limb of the aortobifemoral graft. Selective left lower extremity  runoff angiography was then performed using digital subtraction with bolus and electronic panning.  The entire runoff down to the foot was performed.  Once this was completed successfully, the Omniflush catheter was then removed out of the body over a wire and the right lower extremity selective angiography was then performed through the sheath.  With ejection of the right femoral artery, the patient had significant amount of pain and there was movement not allowing [place] to have adequate visualization of the below-the- knee portion.  Segmental angiography was then performed from the knee down to the foot,  showed the distal runoff.  At this time, the decision was made to stop as a diagnostic case.  The patient was then transferred to the holding area for the sheath to be removed.  The patient was stable before, during, and after the procedure.  There were no complications.  ESTIMATED BLOOD LOSS:  Less than 10 mL.  CATHETERIZATION STATISTICS: 1. Sedation was 1 mg of Versed, 75 mcg of  fentanyl. 2. Contrast:  30 mL of contrast was used.  HEMODYNAMICS: 1. Central aortic pressure 114/73 mmHg. 2. The left ventricular pressure was 117/27 mm with an EDP estimated     to be 33 mmHg, with mean EDP of 28 mmHg.  There was some     respiratory variation.  CORONARY ANGIOGRAPHY FINDINGS: 1. The Right Coronary Artery was a small caliber, but dominant vessel     with a very high bifurcation into a Posterior Atrioventricular     Groove Branch that gave rise to a small Posterolateral Branch and     Atrioventricular Nodal branch, as well as a Posterior Descending     Artery. 2. The Left Main is a moderate-to-large caliber vessel giving rise to     a Circumflex, Ramus Intermedius and LAD.  No significant disease in     this vessel. 3. The LAD is again moderate large-caliber vessel, reached down around     the apex, gives rise to 2 small Diagonal branches and several     Septal Perforators.  No  significant disease in the LAD. 4. The Ramus Intermedius is actually larger in diameter than the LAD.     It gives rise to what appears to be a septal trunk and it courses     down almost into the apex and bifurcating at the end. 5. The Circumflex Artery is a moderate caliber vessel, gives rise to a     very proximal First Obtuse Marginal (OM1) and then 2 more Obtuse     Marginal branches (OM2 and OM3) with the Third Obtuse Marginal as      the parent vessel that bifurcates down toward the apex.       The remainder of the actual circumflex goes on to the atrioventricular groove      giving rise to a couple of posterolateral branches.    *There is a long tubular 70-80% stenosis going into the midvessel ranging     from proximal to OM2 into the bifurcation where the OM3 and the AV groove     artery branch off. 6. The abdominal angiography.  The proximal aorta is a normal caliber     vessel with bilateral patent renal arteries.  No significant     stenosis there as well as no significant stenosis in the superior     mesenteric artery.  The just infrarenal portion of the abdominal     aorta is actually occluded with anastomosis of aortobifemoral     bypass graft with bilateral limbs patent down to the common femoral     arteries or distal portion of the external iliacs. 7. Bilateral Iliac angiography demonstrated bilateral patent limbs of     the Aortobifemoral graft.  There is no retrograde flow in the     native iliac systems, and therefore no flow in the     Hypogastric/Internal Iliac Arteries.  There are stents noted in the occluded     Right [place] and External Iliac Arteries. 8. Left lower extremity angiography revealed flush occlusion of the     left SFA at the bifurcation of the profunda.    * The left profunda femoral artery was a relatively large-caliber   vessel giving rise to significant amount of collateralizations to          reconstitute the distal mid portion of the left SFA.        -- The SFA then continues down below the knee to the popliteal artery  with no      significant disease below the adductor canal until the tibial trifurcation.    * The Anterior Tibial Artery is occluded at the very proximal portion   with good brisk from two-vessel runoff in the Posterior Tibial and Peroneal      arteries.    * The distal portion of the Left Peroneal Artery back-fills   the Dorsalis Pedis portion of the Anterior Tibial Artery via   collateralization  9. The selective Right Lower Extremity angiography revealed no     significant disease in the common femoral artery or the right     profunda.    * The Right Profunda Femoral Artery gives collateralizations to the   mid Right SFA.    * The Right SFA has a proximal segmental focal 70-80% calcified     lesions followed by 100% short occlusion, which reconstitutes well     above the adductor canal.  The remainder of the SFA into the     popliteal artery has no significant disease into the popliteal artery to      the tibial trifurcation.    * Below-the-knee runoff showed again two-vessel runoff with a proximal Anterior Tibial occlusion and widely patent Posterior Tibial and Peroneal arteries being widely patent and the peroneal artery backfilling the     dorsalis pedis portion of the anterior tibial artery, which is     occluded to the proximal portion.  IMPRESSION: 1. Severe single-vessel coronary disease of the left circumflex artery     in the midportion ranging from obtuse marginal 2 to obtuse marginal     3. 2. Elevated left ventricular end-diastolic pressure with known normal     ejection fraction by echocardiogram. 3. Patent aortobifemoral graft that was not previously known with     totally occluded native abdominal iliac vessels with occluded     bilateral stents and no retrograde flow into the hypogastric     arteries. 4. 100% plus occlusion of the left superficial femoral artery which is     chronic  reconstituting above-the-knee.  There is also occlusion of     the reported left femoral-popliteal graft. 5. 100% occlusion of the left anterior tibial artery, which     reconstitutes in the dorsalis pedis portion via the left peroneal. 6. 70% followed by 80% followed by 100% stenosis of the right     superficial femoral artery reconstituting well above the adductor     canal. 7. 100% occlusion of the right anterior tibial vessel again     reconstituting in the dorsalis pedis portion.  PLAN: 1. We will add Plavix and restart his warfarin. 2. Scheduled for staged intervention initially on the right SFA and     stenting likely via antegrade access or brachial access on the left     brachial artery. 3. We will then determine his anginal load and likely proceed with     staged PCI to the left circumflex lesion if the angina is present. 4. Based upon his symptoms, we would consider potentially attempting     staged PTA and stenting of the left SFA occlusion, although this     was very difficult being a flush occlusion and trying to avoid     involvement of the profunda femoral artery. 5. We will proceed with aggressive risk factor modification.  We will     continue the counseling for smoking cessation as well as aggressive     management of his  cholesterol panel, which was noted to be     significantly elevated. 6. The patient will be discharged to follow up with me on this Friday,     April 20, to discuss the upcoming plan.  I did discuss this plan     with the patient's case manager.          ______________________________ Kenneth Corporal, MD     DWH/MEDQ  D:  05/28/2010  T:  05/28/2010  Job:  [id number]  cc:   [name], M.D. [facility]  Electronically Signed by Bryan Lemma MD on 05/29/2010 07:31:45 PM

## 2010-06-24 NOTE — Discharge Summary (Signed)
[name], [name] NO.:  [id number]   MEDICAL RECORD NO.:  [id number]          PATIENT TYPE:  INP   LOCATION:  2024                         FACILITY:  [facility]   PHYSICIAN:  Marcellus Scott, MD     DATE OF BIRTH:  23-Jul-1950   DATE OF ADMISSION:  11/13/2006  DATE OF DISCHARGE:  11/16/2006                               DISCHARGE SUMMARY   PRIMARY CARE PHYSICIAN:  Unassigned.  However, arrangements will be made  for patient to be followed up at the [facility].   CVTS: Dr. Edilia Bo.   DISCHARGE DIAGNOSES:  1. Atypical chest pain - resolved.  2. Acute and chronic bronchitis/chronic obstructive pulmonary disease.  3. Hypertension.  4. Hypokalemia.  5. Tobacco abuse.  6. Peripheral vascular disease with right lower extremity      claudication.  7. History of depression.  8. Alcohol use.  9. Noncompliance.  10.Left upper lobe pulmonary nodule.  11.Dyslipidemia.   DISCHARGE MEDICATIONS:  1. Enteric-coated aspirin 81 mg p.o. daily.  2. Mucinex 600 mg p.o. b.i.d.  3. Metoprolol 12.5 mg p.o. b.i.d.  4. Zocor 20 mg p.o. q.h.s.  5. Tylenol 650 mg p.o. q.6 hourly p.r.n.  6. Lisinopril 10 mg p.o. daily.  7. Avelox 400 mg p.o. daily for 5 days.   PROCEDURES:  1. Persantine/Myoview stress test.  Impression:  a)  No evidence of      pharmacological induced myocardial ischemia.  b) Calculated left      ventricular ejection fraction is 51%.  2. CT angiogram of the chest.  Impression:  a) Technically adequate      study without pulmonary embolus.  b)  Atherosclerosis and coronary      artery disease, which could potentially represent the source of      disease in the chest.  c) Ill-defined pulmonary nodule measuring 6-      mm in the left upper lobe.  Six-month followup recommended in      patient with high risk of malignancy or cigarette smoker with  12      month followup in low risk patient.  d) Old granulomatous disease.  3. Portable chest x-ray.   Impression:  a) Left basilar atelectasis      greater than right, without acute cardiopulmonary disease.  b)      Cardiomegaly.  4. Transthoracic echocardiogram.  Summary is overall left ventricular      systolic function was normal.  Left ventricular ejection fraction      was estimated to be 65%.  There were no left ventricular regional      wall motion abnormalities.  Left ventricular wall thickness was      markedly increased.  Aortic valve thickness was mildly increased.   PERTINENT LABORATORY RESULTS:  Cardiac enzymes cycled are negative.  TSH  0.937.  Lipid panel with cholesterol 165, triglycerides 208, HDL 33, LDL  90, VLDL 42.  CBC with hemoglobin 15.1, hematocrit 44.5, white blood  cells 4.4, platelet count 179.  Basic metabolic panel normal with BUN 8,  creatinine 0.95, D-dimer elevated at 2.04.  BNP of 65.  CONSULTATIONS:  Cardiology from Dr. Domingo Sep of [facility].   HOSPITAL COURSE AND DISPOSITION:  For details of initial admission,  please refer to the history and physical note.  In summary, [name]  is a pleasant 60 year old male patient with history of hypertension,  severe peripheral vascular disease, status post left femoropopliteal  bypass and right-to-left femoral bypass surgery, history of CHF, who has  been noncompliant with his medications, who presented with chest pain,  mainly pleuritic in nature.  He was evaluated in the emergency room  where he was found to be hemodynamically stable; however, his D-dimer  was elevated at 2.04, following which CT angiogram was done and  pulmonary embolism was ruled out.  Patient was admitted to a telemetry  bed to rule out acute coronary syndrome.  His cardiac enzymes were  cycled and negative.  Telemetry did not reveal any significant alarms.  Considering his significant risk factors of smoking, hypertension, prior  history of cardiac arrest, requiring intubation, severe peripheral  vascular  disease, it was thought that patient had significant risk  factors, which required further cardiac evaluation, though the chest  pain seemed mostly musculoskeletal or pleuritic in nature.  Cardiology  was consulted, who saw the patient and performed a stress test, which  was negative.  They have started the patient on aspirin, low-dose beta  blockers and ACE inhibitors.  Patient at this time is asymptomatic of  any chest pain.  He does complain of some cough with intermittent  greenish sputum, however, no dyspnea or fever.  Patient is stable for  discharge.  He will be followed up by the cardiologist in their office.  He also is to make an appointment to be seen by Dr. Edilia Bo for his  right lower extremity intermittent claudication in the context of the  peripheral vascular disease.  He will be provided with the resources to  be seen at [facility] where he can find a primary medical doctor to  closely follow him up. Also, he will need a repeat CT of his chest for  followup of his pulmonary nodule in 6 months time.  Consider repeating  his hepatic panel and muscle enzymes in 4-6 weeks or if patient develops  muscle pain or muscle weakness.  Patient has been instructed to stop  statins and seek immediate medical attention in case he were to have any  muscle weakness or pain, or if there is any further deterioration of his  condition.      Marcellus Scott, MD  Electronically Signed     AH/MEDQ  D:  11/16/2006  T:  11/16/2006  Job:  [id number]   cc:   Hart Rochester. Edilia Bo, M.D.  Dani Gobble, MD

## 2010-06-24 NOTE — H&P (Signed)
[name], [name] NO.:  [id number]   MEDICAL RECORD NO.:  [id number]          PATIENT TYPE:  EMS   LOCATION:  MAJO                         FACILITY:  [facility]   PHYSICIAN:  Gardiner Barefoot, MD    DATE OF BIRTH:  February 13, 1950   DATE OF ADMISSION:  11/13/2006  DATE OF DISCHARGE:                              HISTORY & PHYSICAL   CHIEF COMPLAINT:  Chest pain.   HISTORY OF PRESENT ILLNESS:  This is a 60 year old male with a history  of peripheral vascular disease and several operations for lower  extremity arterial bypasses with a Dacron graft placement, history of  CHF and hypertension and noncompliant with medical therapy for several  years who presents here with about a 3 or 4 day history of left sided  chest pain.  The patient reports that the chest pain started after  coughing and has been pleuritic in nature and has been consistent.  The  patient also reports of nausea, however, no other radiation of the pain.  The patient reports no fever or other associated symptoms.   PAST MEDICAL HISTORY:  1. CHF.  2. Hypertension.  3. Peripheral vascular disease.   MEDICATIONS:  None.   FAMILY HISTORY:  CAD.   SOCIAL HISTORY:  The patient continues to smoke 2 packs per day and  reports history of alcohol but no recent alcohol use.   DRUG ALLERGIES:  DARVOCET.   REVIEW OF SYSTEMS:  Negative except as in the history of present  illness.   PHYSICAL EXAMINATION:  VITAL SIGNS: Temperature was 97.3, pulse 87,  respirations 22, blood pressure is 95/56, oxygen saturations 95% on 2  liters nasal cannula.  GENERAL:  The patient is awake, alert and oriented x3 and appears in on  acute distress.  HEENT:  Has hoarse voice and erythema over his nose and cheeks.  LUNGS:  Plus rhonchi, but no crackles.  CARDIOVASCULAR:  Regular rate and rhythm, no murmurs, rubs or gallops.  ABDOMEN:  Soft, nontender, nondistended.  Positive bowel sounds.  No  hepatosplenomegaly, obese, well  healed mid abdominal surgical scar.  EXTREMITIES:  2+ pitting edema.   LABORATORY DATA:  WBC is 6.9, hemoglobin 15, platelets 218.  Sodium 139,  potassium 3.3, chloride 107, bicarb 24, BUN 6, creatinine 0.98, glucose  99.  AST 21, ALT 118, albumin 3.8, D-dimer 2.04, troponin less than  0.05, CK-MB is less than 1.   CT angiogram reported as negative for pulmonary embolism.   IMPRESSION AND PLAN:  Chest pain:  This likely represents  musculoskeletal in nature with his cough that he has reported for the  last several days.  However, the patient with multiple risk factors for  myocardial infarction, we will have serial enzymes as well as check an  echocardiogram for congestive heart failure.  We will start the patient  on aspirin at this time and monitor his blood pressure.      Gardiner Barefoot, MD  Electronically Signed     RWC/MEDQ  D:  11/14/2006  T:  11/14/2006  Job:  [id number]

## 2010-06-26 ENCOUNTER — Ambulatory Visit: Payer: Medicaid Other | Admitting: *Deleted
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
## 2010-06-27 NOTE — Op Note (Signed)
[name], [name]                       ACCOUNT NO.:  [id number]   MEDICAL RECORD NO.:  [id number]                   PATIENT TYPE:  INP   LOCATION:  NA                                   FACILITY:  [facility]   PHYSICIAN:  Di Kindle. Edilia Bo, M.D.        DATE OF BIRTH:  11-28-1950   DATE OF PROCEDURE:  12/20/2001  DATE OF DISCHARGE:                                 OPERATIVE REPORT   PREOPERATIVE DIAGNOSIS:  Progressive  ischemia of the left leg with severe  aortoiliac occlusive disease.   POSTOPERATIVE DIAGNOSIS:  Progressive  ischemia of the left leg with severe  aortoiliac occlusive disease.   OPERATION PERFORMED:  Aortofemoral bypass graft with 14 x 7 mm Dacron graft.   SURGEON:  Di Kindle. Edilia Bo, M.D.   ASSISTANT:  1. Balinda Quails, M.D.  2. Maple Mirza, P.A.   ANESTHESIA:  General.   INDICATIONS FOR PROCEDURE:  The patient is a 60 year old gentleman who had  undergone a previous right to left fem-fem bypass in [place] and also a  left fem-pop bypass also in [place].  He had presented in my office in  consultation with rest pain in the left foot.  He underwent a diagnostic  arteriogram which showed his stent and graft were patent although he did  have moderate iliac occlusive disease on the right and then an occluded  iliac system on the left.  I angioplastied the recurrent stenosis within a  stented area and the common iliac artery on the right; however, this did not  significantly improve the hemodynamic performance of his graft and he  continued to have rest pain in the left foot.  His pain was intolerable and  he was at significant risk for limb loss.  It was felt that the only option  for revascularization was aortofemoral bypass grafting.  The procedure and  potential complications were discussed with the patient in detail  preoperatively.   DESCRIPTION OF PROCEDURE:  The patient was taken to the operating room after  an arterial line and  Swann-Ganz catheter were placed by anesthesia.  The  abdomen and groins were prepped and draped in the usual sterile fashion.  Starting in the left groin through dense scar tissue, the left limb of the  fem-fem bypass graft was dissected free as was the common femoral artery and  deep femoral artery on the left.  There was extensive scar tissue and the  superficial femoral artery was chronically occluded.  Therefore, I did not  dissect all this out.  I then started the retroperitoneal tunnel on the left  after dividing the crossing vein.  The fem-fem graft was clamped and  divided.  Next in the right groin, again through dense scar tissue, a  longitudinal incision was made over the anastomosis on the right side.  The  fem-fem graft was dissected out through dense scar tissue as was the common  femoral, superficial femoral and deep femoral  arteries.  Next, the graft was  divided.  Retroperitoneal tunnel was started on the right.  The abdomen was  entered through a midline incision and the only intraoperative finding was  some diverticular disease on the colon.  The transverse colon was reflected  superiorly and the small bowel reflected to the right.  The retroperitoneal  tissue was divided and the infrarenal aorta dissected free up to the level  of the left renal vein.  A large gonadal branch on the left branching off of  the vein was ligated between 2-0 silk ties.  Next, the retroperitoneal  tunnels were created on both sides and umbilical tapes were passed on both  sides.  Enough of the infrarenal aorta was dissected out such that I could  excise the segment for end-to-end anastomosis proximally.  The patient  received 9000 units of IV heparin and 25 gm of mannitol.  The infrarenal  aorta was clamped above a large inferior mesenteric artery which was  providing large collaterals to the lower extremities.  The infrarenal aorta  was then clamped.  The aorta was divided proximally and then a  segment of  the aorta was excised.  The distal limb was oversewn with a 3-0 Prolene  using two felt cuffs.  There was a posterior calcific plaque in the  infrarenal aorta which was endarterectomized.  The vessel was irrigated with  copious amounts of heparin and all loose debris removed.  A 14 x 7 graft was  then cut to the appropriate length and then sewn end-to-end to the  infrarenal aorta using a felt cuff.  Three pledgeted repair sutures were  placed on the back for hemostasis.  The anastomosis was then hemostatic and  the clamp reapplied.  Both limbs of the graft were brought to their  respective groins.  On the right side, the common femoral and superficial  femoral arteries were controlled with vessel loops and then the posterior  deep femoral artery was controlled with the Henley clamp.  The old fem-fem  graft was taken off of the artery and the right limb of the old femoral  graft was cut to the appropriate length, spatulated and sewn end-to-side to  the artery using continuous 5-0 Prolene suture.  Prior to completing the  anastomosis, the vessels were back-bled and flushed appropriately and the  anastomosis completed.  Flow was re-established to the right leg which the  patient tolerated from the hemodynamic standpoint.   Next, attention was turned to the left femoral anastomosis.  The common  femoral and deep femoral artery were controlled with vessel loops and then  the left limb of the fem-fem graft was taken off of the artery.  The left  limb of the aortofemoral graft was then cut to the appropriate length,  spatulated and sewn end-to-end to the artery using continuous 6-0 Prolene  suture.  At the completion there was an excellent Doppler signal to the deep  femoral artery which was graft dependent.  Next attention was turned to the  abdomen.  Hemostasis was obtained in the abdomen and the abdomen was irrigated with copious amounts of antibiotic solution.  The  retroperitoneal  tissue was closed over the graft using running 2-0 Vicryl suture.  The  abdominal contents were returned to their normal position.  The fascial  layer was closed with two #1 PDS sutures.  The skin was closed with staples.  The groin incisions were irrigated with copious amounts of antibiotic  solution.  Hemostasis  was obtained.  The wounds were closed with a deep  layer of 2-0 Vicryl.  Subcutaneous layer with 2-0 Vicryl and the skin closed  with staples.  A sterile dressing was applied, the patient tolerated the  procedure well and was transferred to the recovery room in satisfactory  condition.  All needle and sponge counts were correct.                                                  Di Kindle. Edilia Bo, M.D.    CSD/MEDQ  D:  12/20/2001  T:  12/20/2001  Job:  [id number]   cc:   Dr. Chilton Si
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
## 2010-06-27 NOTE — Op Note (Signed)
[name], [name]                       ACCOUNT NO.:  [id number]   MEDICAL RECORD NO.:  [id number]                   PATIENT TYPE:  OIB   LOCATION:  2899                                 FACILITY:  [facility]   PHYSICIAN:  Di Kindle. Edilia Bo, M.D.        DATE OF BIRTH:  1950/10/11   DATE OF PROCEDURE:  11/07/2001  DATE OF DISCHARGE:                                 OPERATIVE REPORT   PREOPERATIVE DIAGNOSES:  Bilateral lower extremity claudication with rest  pain in the left lower extremity with evidence of multilevel arterial  occlusive disease.   POSTOPERATIVE DIAGNOSES:  Bilateral lower extremity claudication with rest  pain in the left lower extremity with evidence of multilevel arterial  occlusive disease.   OPERATION PERFORMED:  1. Aortogram.  2. Bilateral iliac arteriogram.  3. Bilateral lower extremity runoff via left brachial approach.  4. Percutaneous transluminal angioplasty and stenting of right common iliac     artery and external iliac artery with a Genesis [id number] stent and balloon     angioplasty with an 8 x 2 balloon times four.   SURGEON:  Di Kindle. Edilia Bo, M.D.   ANESTHESIA:   INDICATIONS FOR PROCEDURE:  The patient is a 60 year old gentleman who gives  a long history of claudication of both lower extremities.  He has undergone  previous angioplasty and stenting of the right iliac system and subsequently  had a fem-fem bypass and a left femoral-popliteal bypass in [place] in  2002.  He presented with progressive ischemia of both lower extremities and  rest pain on the left side.  I could not palpate femoral pulses on either  side and it was felt that most likely he had occluded his fem-fem-graft and  his right iliac system in addition to his fem-pop bypass on the left.  We  selected a left brachial approach.  The procedure and potential  complications were discussed with the patient in detail preoperatively.   DESCRIPTION OF PROCEDURE:  The  left arm was prepped and draped in the usual  sterile fashion.  After the skin was anesthetized with 1% lidocaine, the  left brachial artery was cannulated and a guidewire introduced into the  superrenal aorta under fluoroscopic control.  A 5 French sheath was passed  over the wire and then a long pigtail catheter advanced over the wire and  positioned at the L1 vertebral body.  Flush aortogram was then obtained.  The aorta was patent as was the right iliac system and the catheter was  advanced down above the bifurcation and iliac projection was obtained and  bilateral lower extremity runoff films were obtained.  The patient had  evidence of recurrent stenosis within the previously stented area in the  right iliac system, in the distal common iliac artery and the proximal  external iliac artery.  The gradient was measured across this.  The initial  gradient across the recurrent iliac stenosis on the right was a systolic  gradient of 35 mmHg with a mean gradient of 15 mmHg.  This area was crossed  with a wire and then the 5 French sheath was exchanged for a 6 [place]  sheath.  The Genesis [id number] premounted stent was passed over the wire and  positioned across the area of stenosis within the distal common iliac artery  on the right and the proximal external iliac artery.  Of note the  hypogastric artery was patent.  The balloon was inflated to six atmospheres  for 30 seconds.  There appeared to be some mild residual stenosis and I went  back with an 8 x 2 balloon and inflated it at the level of the hypogastric  artery and then slightly proximally and distally to the  hypogastric artery.  These inflations were six atmospheres for 20 seconds.  We then went back up  to 10 atmospheres for 30 seconds in the midportion.  Gradients were then  measured again and the gradients had decreased to a systolic gradient of 14  mmHg and a mean gradient of 7 mmHg.  Completion arteriogram showed a widely   patent iliac system on the right.  We did not pursue this any further.  There was no evidence of residual stenosis at the completion of the  angioplasty and stenting.   FINDINGS:  There are single renal arteries bilaterally with no significant  renal artery stenosis identified.  The infrarenal aorta is patent with mild  diffuse disease distally but no focal stenosis.  The left common iliac  artery is occluded at its origin.  The inferior mesenteric artery appears to  be patent.  On the right side, the previously stented area of common iliac  artery and external iliac artery was patent although there was residual  stenosis in the distal common iliac artery and proximal external iliac  artery.  Below that there was only mild diffuse disease with no focal  stenosis within the distal external iliac artery or common femoral artery.  The fem-fem bypass graft is patent.  It appears somewhat redundant at the  proximal anastomosis on the right.  On the left side, the common femoral  artery is occluded above the fem-fem anastomosis.  The superficial femoral  artery was occluded at its origin and there was a large deep femoral artery  which with a large amount of collaterals reconstituted in the above-knee  popliteal artery on the left.  The below-knee popliteal artery was patent  and the proximal tibial vessels were patent.  There was poor visualization  distally because of the proximal occlusion.   On the right side, the deep femoral artery and common femoral artery were  patent as were the superficial femoral artery.  There was mild diffuse  disease at the proximal superficial femoral artery but the artery was  otherwise patent.  The popliteal artery was patent and there was three-  vessel runoff on the right side.   CONCLUSION:  1. Patient right iliac system with recurrent stenosis within the previously    stented area of the distal right coronary artery and proximal external     iliac  artery.  2. Patent fem-fem bypass graft.  3. Occluded left fem-pop bypass graft which had been placed in [place] in     2002.  4. Superficial femoral artery occlusion on the left with reconstitution of     the above-knee popliteal artery.  5. Three-vessel runoff bilaterally.  6. Successful percutaneous transluminal angioplasty and stenting of  recurrent iliac stenosis on the right as described above.                                               Di Kindle. Edilia Bo, M.D.    CSD/MEDQ  D:  11/07/2001  T:  11/07/2001  Job:  [id number]   cc:   Lenon Curt. Chilton Si, M.D.
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
## 2010-07-03 ENCOUNTER — Ambulatory Visit (HOSPITAL_COMMUNITY)
Admission: RE | Admit: 2010-07-03 | Discharge: 2010-07-03 | Disposition: A | Discharge status: Home / Self Care | Payer: Medicaid Other | Source: Ambulatory Visit | Attending: Cardiovascular Disease | Admitting: Cardiovascular Disease

## 2010-07-03 DIAGNOSIS — Z538 Procedure and treatment not carried out for other reasons: Secondary | ICD-10-CM | POA: Insufficient documentation

## 2010-07-03 DIAGNOSIS — I739 Peripheral vascular disease, unspecified: Secondary | ICD-10-CM | POA: Insufficient documentation

## 2010-07-15 ENCOUNTER — Ambulatory Visit (HOSPITAL_COMMUNITY)
Admission: RE | Admit: 2010-07-15 | Discharge: 2010-07-16 | Disposition: A | Discharge status: Home / Self Care | Payer: Medicaid Other | Source: Ambulatory Visit | Attending: Cardiovascular Disease | Admitting: Cardiovascular Disease

## 2010-07-15 DIAGNOSIS — I70219 Atherosclerosis of native arteries of extremities with intermittent claudication, unspecified extremity: Secondary | ICD-10-CM | POA: Insufficient documentation

## 2010-07-15 DIAGNOSIS — J4489 Other specified chronic obstructive pulmonary disease: Secondary | ICD-10-CM | POA: Insufficient documentation

## 2010-07-15 DIAGNOSIS — I7092 Chronic total occlusion of artery of the extremities: Secondary | ICD-10-CM | POA: Insufficient documentation

## 2010-07-15 DIAGNOSIS — Z86718 Personal history of other venous thrombosis and embolism: Secondary | ICD-10-CM | POA: Insufficient documentation

## 2010-07-15 DIAGNOSIS — Z91199 Patient's noncompliance with other medical treatment and regimen due to unspecified reason: Secondary | ICD-10-CM | POA: Insufficient documentation

## 2010-07-15 DIAGNOSIS — E785 Hyperlipidemia, unspecified: Secondary | ICD-10-CM | POA: Insufficient documentation

## 2010-07-15 DIAGNOSIS — F17200 Nicotine dependence, unspecified, uncomplicated: Secondary | ICD-10-CM | POA: Insufficient documentation

## 2010-07-15 DIAGNOSIS — I2510 Atherosclerotic heart disease of native coronary artery without angina pectoris: Secondary | ICD-10-CM | POA: Insufficient documentation

## 2010-07-15 DIAGNOSIS — Z9119 Patient's noncompliance with other medical treatment and regimen: Secondary | ICD-10-CM | POA: Insufficient documentation

## 2010-07-15 DIAGNOSIS — J449 Chronic obstructive pulmonary disease, unspecified: Secondary | ICD-10-CM | POA: Insufficient documentation

## 2010-07-15 DIAGNOSIS — Z01812 Encounter for preprocedural laboratory examination: Secondary | ICD-10-CM | POA: Insufficient documentation

## 2010-07-15 LAB — CBC
HCT: 42.6 % (ref 39.0–52.0)
Hemoglobin: 14.4 g/dL (ref 13.0–17.0)
MCH: 30.1 pg (ref 26.0–34.0)
MCHC: 33.8 g/dL (ref 30.0–36.0)

## 2010-07-15 LAB — BASIC METABOLIC PANEL
BUN: 15 mg/dL (ref 6–23)
CO2: 31 mEq/L (ref 19–32)
Calcium: 9.8 mg/dL (ref 8.4–10.5)
Creatinine, Ser: 1.03 mg/dL (ref 0.4–1.5)
GFR calc non Af Amer: >60 mL/min (ref >60)
Glucose, Bld: 107 mg/dL — ABNORMAL HIGH (ref 70–99)

## 2010-07-15 LAB — PROTIME-INR: Prothrombin Time: 12.9 seconds (ref 11.6–15.2)

## 2010-07-16 LAB — CBC
MCH: 29 pg (ref 26.0–34.0)
MCHC: 32.6 g/dL (ref 30.0–36.0)
MCV: 88.9 fL (ref 78.0–100.0)
Platelets: 186 10*3/uL (ref 150–400)

## 2010-07-16 LAB — BASIC METABOLIC PANEL
BUN: 13 mg/dL (ref 6–23)
Calcium: 9.5 mg/dL (ref 8.4–10.5)
Chloride: 108 mEq/L (ref 96–112)
Creatinine, Ser: 0.86 mg/dL (ref 0.4–1.5)
GFR calc Af Amer: >60 mL/min (ref >60)

## 2010-07-23 NOTE — Procedures (Signed)
  NAMEOSVALDO, LAMPING NO.:  [id number]  MEDICAL RECORD NO.:  [id number]  LOCATION:  6531                         FACILITY:  [facility]  PHYSICIAN:  Nanetta Batty, M.D.   DATE OF BIRTH:  [birth year]  DATE OF PROCEDURE: DATE OF DISCHARGE:                   PERIPHERAL VASCULAR INVASIVE PROCEDURE   Attempt at PTA and stenting of right SFA.  [name] is a 60 year old gentleman with history of CAD and PAD.  He has had cutting balloon aortobifemoral past grafting as well.  His other problems include hyperlipidemia, COPD with chronic tobacco abuse.  He has had left fem-pop bypass grafting as well to an occluded graft.  He had angiogram by Dr. Herbie Rangel via the right common femoral artery retrograde approach revealing subtotally occluded left SFA which is highly calcified.  He presents now for angiography potential intervention using Diamondback rotational atherectomy, PTA and stent distal protection via the left brachial approach.  PROCEDURE DESCRIPTION:  The patient brought to the second floor of [facility] in the postabsorptive state.  He was premedicated with p.o. [name], IV Versed and fentanyl.  His left antecubital fossa was prepped and shaved in usual sterile fashion. Xylocaine 1% was used for local anesthesia.  The left brachial artery was cannulated using micropuncture technique and a long Versacore wire. Ultimately, a 6-French 90-cm long Ansel sheath was then directed down the descending aorta into the right limb of the aortobifem.  A Treasure 12 wire was then advanced to the point of CTO via a 0.015 150-cm long length end-hole catheter.  The patient did receive Angiomax bolus with an ACT of over 300, total of 70 mL contrast was used.  The Treasure 12 wire crossed what appeared to be this CPO; however, further imaging to verify placement revealed it to be subintimal.  Because of the fact that this was a total occlusion with calcification  with plans to perform atherectomy this was contraindicated in a subintimal space, therefore the procedure was aborted.  The long 90-cm length Ansel sheath was then removed over the Versacore wire and exchanged for a short 6-French brachial sheath.  This will be removed once ACT falls below 170.  The patient will be hydrated overnight, discharged home in the morning.  After discussion with Dr. Herbie Rangel, he will then see the patient back in the office and we will refer the patient to Dr. Durene Cal for consideration of surgical revascularization.     Nanetta Batty, M.D.     Cordelia Pen  [name]  [name]  Job:  [id number]  cc:   [facility]. [facility]. Kenneth Corporal, MD Kenneth Anis, PA-C  Electronically Signed by Nanetta Batty M.D. on 07/23/2010 10:46:33 AM

## 2010-07-28 ENCOUNTER — Encounter: Payer: Medicaid Other | Admitting: Surgery

## 2010-07-28 NOTE — Discharge Summary (Signed)
[name], [name] NO.:  [id number]  MEDICAL RECORD NO.:  [id number]  LOCATION:  6531                         FACILITY:  [facility]  PHYSICIAN:  Landry Corporal, MD DATE OF BIRTH:  [birth year]  DATE OF ADMISSION:  07/15/2010 DATE OF DISCHARGE:  07/16/2010                              DISCHARGE SUMMARY   DISCHARGE DIAGNOSES: 1. Claudication. 2. Severe peripheral vascular disease, unsuccessful attempt at right     superficial femoral artery percutaneous transluminal angioplasty     this admission. 3. Single-vessel coronary artery disease by catheterization April     2012, with a 70-80% circumflex lesion. 4. Chronic obstructive pulmonary disease, the patient is a two-pack a     day smoker. 5. History of deep vein thrombosis noted in 2011, the patient is on     Coumadin prior to admission. 6. Psychiatric history with multiple psychiatric admissions in the     past, the patient is also noncompliant, he stopped all his     medicines 1 week prior to coming in the hospital for his peripheral     vascular angiogram. 7. History of past alcohol and narcotic abuse, the patient is     recovering addict. 8. Dyslipidemia.  HOSPITAL COURSE:  The patient is a 60 year old male seen by Dr. Herbie Baltimore as an outpatient.  He has known vascular disease and has had previous aortobifemoral bypass grafting in 2003 by Dr. Edilia Bo.  He has had lifestyle-limiting claudication.  Dopplers show severe progression in his disease, see Dr. Elissa Hefty note from May 30, 2010.  He was admitted for an attempted PV angiogram and possible right SFA PTA.  This was done with Dr. Herbie Baltimore and Dr. Allyson Sabal and unfortunately was unsuccessful.  Dr. Allyson Sabal feels the patient needs right to left fem pop bypass grafting.  On admission, the patient admitted that he had stopped all his medications a week ago.  He had been instructed to stop his Coumadin but he decided to stop everything, "it was not doing me  any good."  At discharge, the patient continues to be noncompliant with medications, although he does agree to take his Coumadin.  He also has some psychiatric medicines and he does take.  At discharge, we will stop his Plavix as he is already on Coumadin and Pletal and he may need surgery soon.  He will follow up with Dr. Herbie Baltimore.  He will have an INR checked Monday.  Please see med rec for complete discharge medications.  LABORATORIES AT DISCHARGE:  White count 8.3, hemoglobin 13.6, hematocrit 41.7, platelets 196.  Sodium 144, potassium 4.2, BUN 13, creatinine 0.86.  Telemetry shows sinus rhythm, he did have a brief period of Wenckebach without symptoms.  Chest x-ray done in April showed no acute findings.  DISPOSITION:  The patient is discharged in stable condition, he continued to refuse to take his medications although he has agreed to take his Coumadin.  We will see him back in the office as an outpatient.   He will be referred to our Vascular Surgery colleagues for Femoral-Popliteal  Artery Bypass.     Abelino Derrick, P.A.   ______________________________ Landry Corporal, MD    LKK/MEDQ  D:  07/16/2010  T:  07/17/2010  Job:  [id number]  cc:   Di Kindle. Edilia Bo, M.D. [facility]  Electronically Signed by Corine Shelter P.A. on 07/22/2010 08:50:47 AM Electronically Signed by Bryan Lemma MD on 07/28/2010 07:25:06 AM

## 2010-07-28 NOTE — H&P (Signed)
[name], GRABE NO.:  [id number]  MEDICAL RECORD NO.:  [id number]  LOCATION:  [facility]                         FACILITY:  [facility]  PHYSICIAN:  Landry Corporal, MD DATE OF BIRTH:  09/08/1950  DATE OF ADMISSION:  07/15/2010 DATE OF DISCHARGE:                             HISTORY & PHYSICAL   CHIEF COMPLAINTS:  Claudication.  HISTORY OF PRESENT ILLNESS:  [name] is a totally recalcitrant, noncompliant 60 year old male followed by Dr. Herbie Baltimore.  He has severe vascular disease.  He has had multiple catheterizations.  Please see Dr. Elissa Hefty note, May 30, 2010.  He is admitted now for peripheral angiogram because of claudication.  He stopped all his medicines a week ago for some unknown reason.  He continues to smoke 2 packs a day. There has been no interim change in his status besides his noncompliance since his last office visit on May 30, 2010.  PAST MEDICAL HISTORY:  Remarkable for: 1. Single-vessel coronary artery disease with catheterization in April     2012, showing 70%-80% circumflex. 2. There is a history of DVT in the past and he has been on Coumadin. 3. He has COPD. 4. Dyslipidemia. 5. He has got a past history of alcohol and narcotic abuse, apparently     is recovering.  He continues to smoke a pack and a half to 2 packs     a day.  His medications as of May 30, 2010, are: 1. Plavix 75 mg a day. 2. Losartan 40 mg a day. 3. Lasix 20 mg a day. 4. Coumadin as directed. 5. Invega ER 30 mg a day. 6. Zyrtec 10 mg a day. 7. Lisinopril/hydrochlorothiazide 10/12.5 daily. 8. Neurontin 300 mg which he is not taking. 9. Amlodipine 5 mg a day. 10.Remeron 7.5 mg a day. 11.Pletal 100 mg a day. 12.Metoprolol 25 mg b.i.d.  As noted above, he stopped all his     medications a week ago.  His allergies include OXYCONTIN and OXYCODONE because of prior addiction.  He has had an adverse reaction to HALDOL in the past.  Child psychotherapist.  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  He is still smoking.  FAMILY HISTORY:  Unremarkable.  REVIEW OF SYSTEMS:  Essentially unremarkable except for noted above.  PHYSICAL EXAMINATION:  VITAL SIGNS:  Blood pressure 125/74, pulse 86, temperature 97.5, respirations 18. GENERAL:  He is an obese male in no acute distress. HEENT:  Normocephalic.  He has poor dentition. NECK:  Without JVD. CHEST:  Diminished breath sounds with no wheezing. CARDIAC:  Diminished heart sounds with regular rate and rhythm. ABDOMEN:  Obese, nontender. EXTREMITIES:  Trace edema bilaterally with diminished distal pulses. NEUROLOGIC:  Grossly intact.  He is awake, alert, oriented, cooperative.  Labs are pending.  His INR is 0.9.  White count 8.0, hemoglobin 14.4, hematocrit 42.6, platelets 213.  BMP is pending.  IMPRESSION: 1. Severe claudication. 2. Known vascular disease as described in Dr. Elissa Hefty note from     May 30, 2010. 3. Single-vessel coronary artery disease by catheterization in April     2012. 4. Chronic obstructive pulmonary disease and smoking, continues to  smoke one and a half packs to 2 packs a day. 5. Noncompliance with medications, the patient stopped all his     medicines a week ago. 6. History of deep venous thrombosis on Coumadin. 7. Treated dyslipidemia. 8. Past alcohol and narcotic abuse, recovering addict.  PLAN:  We will check his renal function.  Hopefully, we can proceed with PV angiogram today.     Abelino Derrick, P.A.   ______________________________ Landry Corporal, MD    LKK/MEDQ  D:  07/15/2010  T:  07/15/2010  Job:  [id number]  Electronically Signed by Corine Shelter P.A. on 07/22/2010 08:50:42 AM Electronically Signed by Bryan Lemma MD on 07/28/2010 07:24:03 AM

## 2010-07-30 ENCOUNTER — Encounter (INDEPENDENT_AMBULATORY_CARE_PROVIDER_SITE_OTHER): Payer: Medicaid Other | Admitting: Vascular Surgery

## 2010-07-30 DIAGNOSIS — I70219 Atherosclerosis of native arteries of extremities with intermittent claudication, unspecified extremity: Secondary | ICD-10-CM
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
## 2010-08-06 ENCOUNTER — Encounter (INDEPENDENT_AMBULATORY_CARE_PROVIDER_SITE_OTHER): Payer: Medicaid Other

## 2010-08-06 DIAGNOSIS — I70219 Atherosclerosis of native arteries of extremities with intermittent claudication, unspecified extremity: Secondary | ICD-10-CM

## 2010-08-06 DIAGNOSIS — Z01810 Encounter for preprocedural cardiovascular examination: Secondary | ICD-10-CM

## 2010-08-08 ENCOUNTER — Encounter: Payer: Self-pay | Admitting: Internal Medicine

## 2010-08-08 ENCOUNTER — Inpatient Hospital Stay (HOSPITAL_COMMUNITY)
Admission: EM | Admit: 2010-08-08 | Discharge: 2010-08-11 | DRG: 195 | Disposition: A | Discharge status: Home / Self Care | Payer: Medicaid Other | Attending: Internal Medicine | Admitting: Internal Medicine

## 2010-08-08 ENCOUNTER — Emergency Department (HOSPITAL_COMMUNITY): Payer: Medicaid Other

## 2010-08-08 DIAGNOSIS — E876 Hypokalemia: Secondary | ICD-10-CM | POA: Diagnosis not present

## 2010-08-08 DIAGNOSIS — I739 Peripheral vascular disease, unspecified: Secondary | ICD-10-CM | POA: Diagnosis present

## 2010-08-08 DIAGNOSIS — I2510 Atherosclerotic heart disease of native coronary artery without angina pectoris: Secondary | ICD-10-CM | POA: Diagnosis present

## 2010-08-08 DIAGNOSIS — J189 Pneumonia, unspecified organism: Principal | ICD-10-CM | POA: Diagnosis present

## 2010-08-08 DIAGNOSIS — Z7902 Long term (current) use of antithrombotics/antiplatelets: Secondary | ICD-10-CM

## 2010-08-08 DIAGNOSIS — M545 Low back pain, unspecified: Secondary | ICD-10-CM | POA: Diagnosis present

## 2010-08-08 DIAGNOSIS — I10 Essential (primary) hypertension: Secondary | ICD-10-CM | POA: Diagnosis present

## 2010-08-08 DIAGNOSIS — J449 Chronic obstructive pulmonary disease, unspecified: Secondary | ICD-10-CM | POA: Diagnosis present

## 2010-08-08 DIAGNOSIS — J4489 Other specified chronic obstructive pulmonary disease: Secondary | ICD-10-CM | POA: Diagnosis present

## 2010-08-08 DIAGNOSIS — Z7901 Long term (current) use of anticoagulants: Secondary | ICD-10-CM

## 2010-08-08 DIAGNOSIS — F17200 Nicotine dependence, unspecified, uncomplicated: Secondary | ICD-10-CM | POA: Diagnosis present

## 2010-08-08 LAB — URINALYSIS, ROUTINE W REFLEX MICROSCOPIC
Bilirubin Urine: NEGATIVE
Glucose, UA: NEGATIVE mg/dL
Hgb urine dipstick: NEGATIVE
Ketones, ur: 15 mg/dL — AB
Leukocytes, UA: NEGATIVE
Nitrite: NEGATIVE
Protein, ur: NEGATIVE mg/dL
Specific Gravity, Urine: 1.014 (ref 1.005–1.030)
Urobilinogen, UA: 1 mg/dL (ref 0.0–1.0)
pH: 6.5 (ref 5.0–8.0)

## 2010-08-08 LAB — COMPREHENSIVE METABOLIC PANEL
ALT: 14 U/L (ref 0–53)
AST: 17 U/L (ref 0–37)
Albumin: 3.4 g/dL — ABNORMAL LOW (ref 3.5–5.2)
Alkaline Phosphatase: 64 U/L (ref 39–117)
BUN: 14 mg/dL (ref 6–23)
CO2: 26 mEq/L (ref 19–32)
Calcium: 9.8 mg/dL (ref 8.4–10.5)
Chloride: 105 mEq/L (ref 96–112)
Creatinine, Ser: 0.88 mg/dL (ref 0.50–1.35)
GFR calc Af Amer: >60 mL/min (ref >60)
GFR calc non Af Amer: >60 mL/min (ref >60)
Glucose, Bld: 99 mg/dL (ref 70–99)
Potassium: 4.3 mEq/L (ref 3.5–5.1)
Sodium: 140 mEq/L (ref 135–145)
Total Bilirubin: 0.3 mg/dL (ref 0.3–1.2)
Total Protein: 6.9 g/dL (ref 6.0–8.3)

## 2010-08-08 LAB — CBC
HCT: 42.2 % (ref 39.0–52.0)
Hemoglobin: 13.9 g/dL (ref 13.0–17.0)
MCH: 29.1 pg (ref 26.0–34.0)
MCHC: 32.9 g/dL (ref 30.0–36.0)
MCV: 88.3 fL (ref 78.0–100.0)
Platelets: 179 10*3/uL (ref 150–400)
RBC: 4.78 MIL/uL (ref 4.22–5.81)
RDW: 14 % (ref 11.5–15.5)
WBC: 8.7 10*3/uL (ref 4.0–10.5)

## 2010-08-08 LAB — APTT: aPTT: 33 seconds (ref 24–37)

## 2010-08-08 LAB — PROTIME-INR
INR: 1.2 (ref 0.00–1.49)
Prothrombin Time: 15.5 seconds — ABNORMAL HIGH (ref 11.6–15.2)

## 2010-08-08 LAB — STREP PNEUMONIAE URINARY ANTIGEN: Strep Pneumo Urinary Antigen: NEGATIVE

## 2010-08-08 LAB — HEPATIC FUNCTION PANEL
ALT: 14 U/L (ref 0–53)
AST: 15 U/L (ref 0–37)
Albumin: 3.3 g/dL — ABNORMAL LOW (ref 3.5–5.2)
Alkaline Phosphatase: 59 U/L (ref 39–117)
Bilirubin, Direct: 0.1 mg/dL (ref 0.0–0.3)
Indirect Bilirubin: 0.1 mg/dL — ABNORMAL LOW (ref 0.3–0.9)
Total Bilirubin: 0.2 mg/dL — ABNORMAL LOW (ref 0.3–1.2)
Total Protein: 6.8 g/dL (ref 6.0–8.3)

## 2010-08-08 LAB — CK TOTAL AND CKMB (NOT AT ARMC)
CK, MB: 2.1 ng/mL (ref 0.3–4.0)
Relative Index: INVALID (ref 0.0–2.5)
Total CK: 52 U/L (ref 7–232)

## 2010-08-08 LAB — DIFFERENTIAL
Basophils Absolute: 0 10*3/uL (ref 0.0–0.1)
Basophils Relative: 0 % (ref 0–1)
Eosinophils Absolute: 0.2 10*3/uL (ref 0.0–0.7)
Monocytes Absolute: 1 10*3/uL (ref 0.1–1.0)
Monocytes Relative: 12 % (ref 3–12)
Neutro Abs: 5.5 10*3/uL (ref 1.7–7.7)
Neutrophils Relative %: 63 % (ref 43–77)

## 2010-08-08 LAB — PRO B NATRIURETIC PEPTIDE: Pro B Natriuretic peptide (BNP): 282.1 pg/mL — ABNORMAL HIGH (ref 0–125)

## 2010-08-08 LAB — TROPONIN I: Troponin I: 0.32 ng/mL (ref <0.30)

## 2010-08-08 LAB — RAPID URINE DRUG SCREEN, HOSP PERFORMED
Amphetamines: NOT DETECTED
Barbiturates: NOT DETECTED
Benzodiazepines: NOT DETECTED
Opiates: NOT DETECTED

## 2010-08-08 NOTE — H&P (Signed)
Hospital Admission Note Date: 08/08/2010  Patient name: [name] Medical record number: [id number] Date of birth: Mar 30, 1950 Age: 60 y.o. Gender: male PCP: Elpidio Anis, PA, PA  Medical Service: B2  Attending physician: Dr. Blanch Media  Resident [phone]): Dr. Eben Burow Pager: [phone] Resident (R1): Dr. Anselm Jungling  Pager: [phone]  Chief Complaint: Cough and SOB  History of Present Illness: [name] is a 60 year old man who presented to the [facility] ER with complaints of a cough for the last two days. He states that he started to have an increased cough Tuesday morning. He is chronically short of breath because of COPD and continued tobacco abuse at 1.5-2 ppd. He has also noticed today that he had some blood mixed in the sputum. He states that it was streaks of blood mixed in with the sputum. He denies any change in color of his sputum, increased production of sputum, or increase in SOB. He also denies fevers, chills, nausea, vomiting, diarrhea, or abdominal pain. He has some bilateral lower chest pain that started today when he had a long coughing fit. He lives at [place] and states that he has not been visiting the common areas often and has not had any close contact with anyone who has been sick lately. He states that he takes his medications as prescribed except for his inhalers.   Of note he is on chronic coumadin therapy and on presentation he was subtheraputic. He is scheduled for a femoral bypass surgery on 08/19/10 by Dr. Herbie Baltimore and was told that he needs to hold his coumadin for 5 days before the procedure.   Past Medical History:  1. Claudication.   2. Severe peripheral vascular disease, unsuccessful attempt at right       superficial femoral artery percutaneous transluminal angioplasty       this admission.   3. Single-vessel coronary artery disease by catheterization April       2012, with a 70-80% circumflex lesion.   4. Chronic obstructive pulmonary disease, the patient is a two-pack a        day smoker.   5. History of deep vein thrombosis noted in 2011, the patient is on       Coumadin prior to admission.   6. Psychiatric history with multiple psychiatric admissions in the       past, the patient is also noncompliant, he stopped all his       medicines 1 week prior to coming in the hospital for his peripheral       vascular angiogram.   7. History of past alcohol and narcotic abuse, the patient is       recovering addict.   8. Dyslipidemia.     Allergies: OXYCONTIN and OXYCODONE because of prior addiction.   HALDOL.  Home Medications:  1. Lisinopril 10 mg/hydrochlorothiazide 12.5 mg p.o. daily.   2. Coumadin.   3. Lasix 20 mg p.o. daily.   4. Invega  5. Omeprazole.    6. Plavix 75 mg p.o. daily.   7. Lovastatin 40 mg p.o. b.i.d.   8. Remeron 15 mg p.o. nightly.   9. Robaxin 750 mg p.o. p.r.n.   10.Hydrocodone 5/500 p.o. b.i.d. p.r.n.   Family History: CAD and HTN.   Social History: Is able to read, has basic education only, he is not married, lives in [place].  Substance History: He is still smoking. and reports history of alcohol but no recent alcohol use.  Review of Systems: Negative except per HPI.  Physical Exam: Vitals: T= 98.3,  BP=135/61, HR=81, RR=18 , O2 Sat=95% on RA. General:  alert, well-developed, and cooperative to examination.   Head:  normocephalic and atraumatic.   Eyes:  vision grossly intact, pupils equal, pupils round, pupils reactive to light, no injection and anicteric.   Mouth:  pharynx pink and moist, no erythema, and no exudates.   Neck:  supple, full ROM, no thyromegaly, no JVD, and no carotid bruits.   Lungs:  normal respiratory effort, no accessory muscle use, Diffuse Ronchi, no wheezing.  Heart:  normal rate, regular rhythm, no murmur, no gallop, and no rub.   Abdomen:  soft, non-tender, normal bowel sounds, no distention, no guarding, no rebound tenderness, no hepatomegaly, and no splenomegaly.   Msk:  no joint  swelling, no joint warmth, and no redness over joints.   Pulses:  2+ DP/PT pulses bilaterally Extremities:  No cyanosis, clubbing, +1 LE edema. Neurologic:  alert & oriented X3, cranial nerves II-XII intact, strength normal in all extremities, sensation intact to light touch, and gait normal.   Skin:  turgor normal and no rashes.   Psych:  Oriented X3, memory intact for recent and remote, normally interactive, good eye contact, not anxious appearing, and not depressed appearing.  Lab results: Admission on 08/08/2010  Component Value Range  . Neutrophils Relative (%) 63  43-77  . Neutro Abs (K/uL) 5.5  1.7-7.7  . Lymphocytes Relative (%) 23  12-46  . Lymphs Abs (K/uL) 2.0  0.7-4.0  . Monocytes Relative (%) 12  3-12  . Monocytes Absolute (K/uL) 1.0  0.1-1.0  . Eosinophils Relative (%) 3  0-5  . Eosinophils Absolute (K/uL) 0.2  0.0-0.7  . Basophils Relative (%) 0  0-1  . Basophils Absolute (K/uL) 0.0  0.0-0.1  . WBC (K/uL) 8.7  4.0-10.5  . RBC (MIL/uL) 4.78  4.22-5.81  . Hemoglobin (g/dL) 16.1  09.6-04.5  . HCT (%) 42.2  39.0-52.0  . MCV (fL) 88.3  78.0-100.0  . MCH (pg) 29.1  26.0-34.0  . MCHC (g/dL) 40.9  81.1-91.4  . RDW (%) 14.0  11.5-15.5  . Platelets (K/uL) 179  150-400  . Sodium (mEq/L) 140  135-145  . Potassium (mEq/L) 4.3  3.5-5.1  . Chloride (mEq/L) 105  96-112  . CO2 (mEq/L) 26  19-32  . Glucose, Bld (mg/dL) 99  78-29  . BUN (mg/dL) 14  5-62  . Creatinine, Ser (mg/dL) 1.30  8.65-7.84  . Calcium (mg/dL) 9.8  6.9-62.9  . Total Protein (g/dL) 6.9  5.2-8.4  . Albumin (g/dL) 3.4* 1.3-2.4  . AST (U/L) 17  0-37  . ALT (U/L) 14  0-53  . Alkaline Phosphatase (U/L) 64  39-117  . Total Bilirubin (mg/dL) 0.3  4.0-1.0  . GFR calc non Af Amer (mL/min) >60  >60  . GFR calc Af Amer (mL/min) >60  >60  . Prothrombin Time (seconds) 15.5* 11.6-15.2  . INR  1.20  0.00-1.49  . aPTT (seconds) 33  24-37   Imaging results:  CHEST - 2 VIEW IMPRESSION:   Prominent bilateral  pericardial fat pads although there is some   superimposed patchy airspace disease in the right base on today's   study.  This could be secondary to atelectasis or pneumonia.  Other: Cath 4/12  1. Severe single-vessel coronary disease of the left circumflex artery       in the midportion ranging from obtuse marginal 2 to obtuse marginal       3.   2. Elevated  left ventricular end-diastolic pressure with known normal       ejection fraction by echocardiogram.   3. Patent aortobifemoral graft that was not previously known with       totally occluded native abdominal iliac vessels with occluded       bilateral stents and no retrograde flow into the hypogastric       arteries.   4. 100% plus occlusion of the left superficial femoral artery which is       chronic reconstituting above-the-knee.  There is also occlusion of       the reported left femoral-popliteal graft.   5. 100% occlusion of the left anterior tibial artery, which       reconstitutes in the dorsalis pedis portion via the left peroneal.   6. 70% followed by 80% followed by 100% stenosis of the right       superficial femoral artery reconstituting well above the adductor       canal.   7. 100% occlusion of the right anterior tibial vessel again       reconstituting in the dorsalis pedis portion.  Assessment & Plan by Problem: 1. PNA: Given this patient's clinical picture of cough, productive sputum, and chest xray findings of probably RLL PNA, that is most likely the reason for his symptoms. the patient is saturating >95% on oxygen on RA.  Plan -Admit to Regular floor.  -Start IV antibiotics for community acquired pneumonia: Rocephin, Azithromycin, Will not initiate treatment for HAP (despite that he was recently in the hospital) given that he has no WBC elevation or fever, however if he does not improve symptomatically, consider broader antibiotic coverage.  -Will not check blood cultures given normal WBC count and lack of  fever, therefore no signs of systemic involvement. -Will check strep pneumo, legionella antigens and sent sputum gram stain and culture.  -Check UA, CEx3, EKG.  2. COPD: At baseline, not on any inhalers at home, will given PRN albuterol while in hospital.  3. History of DVT in 2011: On Warfarin since then, pt has not been taking it given he is scheduled for surgery on 08/19/10, for now will provide with lovenox for prophylaxis of DVT.   4. HYPERTENSION - Well controlled, continue home meds.  5. Claudication: Severe PAD, is scheduled for surgery for femoral artery bypass on July 10th.  6. HYPERLIPIDEMIA - Continue home statin and check fasting lipid in AM.  7. Psychiatric/PSA: Patient denies suicidal ideation. Will contine home meds, and check UDS.  8. Tobacco Abuse: Provide with nicotine patch while in-patient.  9. DVT: Lovenox    R2__________________________________________     R1__________________________________________  ATTENDING: I performed and/or observed a history and physical examination of the patient.  I discussed the case with the residents as noted and reviewed the residents' notes.  I agree with the findings and plan--please refer to the attending physician note for more details.  Signature________________________________  Printed Name_____________________________

## 2010-08-09 LAB — CBC
HCT: 39 % (ref 39.0–52.0)
RBC: 4.48 MIL/uL (ref 4.22–5.81)
RDW: 14 % (ref 11.5–15.5)
WBC: 6.3 10*3/uL (ref 4.0–10.5)

## 2010-08-09 LAB — LIPID PANEL
Cholesterol: 173 mg/dL (ref 0–200)
HDL: 31 mg/dL — ABNORMAL LOW (ref >39)
LDL Cholesterol: 113 mg/dL — ABNORMAL HIGH (ref 0–99)
Total CHOL/HDL Ratio: 5.6 RATIO
Triglycerides: 145 mg/dL (ref <150)
VLDL: 29 mg/dL (ref 0–40)

## 2010-08-09 LAB — CARDIAC PANEL(CRET KIN+CKTOT+MB+TROPI): Total CK: 45 U/L (ref 7–232)

## 2010-08-09 LAB — BASIC METABOLIC PANEL
CO2: 29 mEq/L (ref 19–32)
Calcium: 9.6 mg/dL (ref 8.4–10.5)
Chloride: 108 mEq/L (ref 96–112)
Glucose, Bld: 141 mg/dL — ABNORMAL HIGH (ref 70–99)
Sodium: 145 mEq/L (ref 135–145)

## 2010-08-09 LAB — TSH: TSH: 0.636 u[IU]/mL (ref 0.350–4.500)

## 2010-08-10 LAB — BASIC METABOLIC PANEL
BUN: 11 mg/dL (ref 6–23)
CO2: 24 mEq/L (ref 19–32)
Chloride: 106 mEq/L (ref 96–112)
Creatinine, Ser: 0.79 mg/dL (ref 0.50–1.35)
Glucose, Bld: 135 mg/dL — ABNORMAL HIGH (ref 70–99)
Potassium: 3.9 mEq/L (ref 3.5–5.1)

## 2010-08-11 DIAGNOSIS — J189 Pneumonia, unspecified organism: Secondary | ICD-10-CM

## 2010-08-11 LAB — BASIC METABOLIC PANEL
CO2: 26 mEq/L (ref 19–32)
GFR calc non Af Amer: >60 mL/min (ref >60)
Glucose, Bld: 126 mg/dL — ABNORMAL HIGH (ref 70–99)
Potassium: 3.9 mEq/L (ref 3.5–5.1)
Sodium: 140 mEq/L (ref 135–145)

## 2010-08-11 NOTE — Procedures (Unsigned)
VASCULAR LAB EXAM  INDICATION:  Preoperative vein mapping.  HISTORY: Diabetes:  Yes Cardiac:  Coronary artery disease, CHF Hypertension:  Yes Other:  Hyperlipidemia.  Smoker, 1-1/2 packs per day.  EXAM:  Right great saphenous and small saphenous vein mapping for preoperative testing.  IMPRESSION: 1. Areas of partial compressibility noted in the right great saphenous     vein in the mid thigh and calf regions. 2. Small saphenous vein and great saphenous vein measurements are     included in the following worksheet.  ___________________________________________ Janetta Hora. Fields, MD  EM/MEDQ  D:  08/07/2010  T:  08/07/2010  Job:  [id number]

## 2010-08-18 ENCOUNTER — Other Ambulatory Visit: Payer: Self-pay | Admitting: Vascular Surgery

## 2010-08-18 ENCOUNTER — Encounter (HOSPITAL_COMMUNITY)
Admission: RE | Admit: 2010-08-18 | Discharge: 2010-08-18 | Disposition: A | Discharge status: Home / Self Care | Payer: Medicaid Other | Source: Ambulatory Visit | Attending: Vascular Surgery | Admitting: Vascular Surgery

## 2010-08-18 ENCOUNTER — Encounter: Payer: Medicaid Other | Admitting: Internal Medicine

## 2010-08-18 DIAGNOSIS — G4730 Sleep apnea, unspecified: Secondary | ICD-10-CM

## 2010-08-18 DIAGNOSIS — I10 Essential (primary) hypertension: Secondary | ICD-10-CM

## 2010-08-18 LAB — SURGICAL PCR SCREEN
MRSA, PCR: NEGATIVE
Staphylococcus aureus: NEGATIVE

## 2010-08-18 LAB — URINALYSIS, ROUTINE W REFLEX MICROSCOPIC
Bilirubin Urine: NEGATIVE
Glucose, UA: NEGATIVE mg/dL
Hgb urine dipstick: NEGATIVE
Ketones, ur: NEGATIVE mg/dL
pH: 5.5 (ref 5.0–8.0)

## 2010-08-18 LAB — TYPE AND SCREEN

## 2010-08-18 LAB — COMPREHENSIVE METABOLIC PANEL
ALT: 16 U/L (ref 0–53)
AST: 12 U/L (ref 0–37)
Albumin: 3.7 g/dL (ref 3.5–5.2)
Alkaline Phosphatase: 74 U/L (ref 39–117)
GFR calc Af Amer: >60 mL/min (ref >60)
Glucose, Bld: 77 mg/dL (ref 70–99)
Potassium: 4.4 mEq/L (ref 3.5–5.1)
Sodium: 138 mEq/L (ref 135–145)
Total Protein: 6.8 g/dL (ref 6.0–8.3)

## 2010-08-18 LAB — ABO/RH: ABO/RH(D): O POS

## 2010-08-18 LAB — CBC
Hemoglobin: 14.7 g/dL (ref 13.0–17.0)
MCH: 30 pg (ref 26.0–34.0)
RBC: 4.9 MIL/uL (ref 4.22–5.81)

## 2010-08-18 LAB — PROTIME-INR: Prothrombin Time: 13.2 seconds (ref 11.6–15.2)

## 2010-08-19 ENCOUNTER — Inpatient Hospital Stay (HOSPITAL_COMMUNITY)
Admission: RE | Admit: 2010-08-19 | Discharge: 2010-08-24 | DRG: 253 | Disposition: A | Discharge status: Home / Self Care | Payer: Medicaid Other | Source: Ambulatory Visit | Attending: Vascular Surgery | Admitting: Vascular Surgery

## 2010-08-19 ENCOUNTER — Inpatient Hospital Stay (HOSPITAL_COMMUNITY): Payer: Medicaid Other

## 2010-08-19 DIAGNOSIS — Z01812 Encounter for preprocedural laboratory examination: Secondary | ICD-10-CM

## 2010-08-19 DIAGNOSIS — Z7901 Long term (current) use of anticoagulants: Secondary | ICD-10-CM

## 2010-08-19 DIAGNOSIS — I252 Old myocardial infarction: Secondary | ICD-10-CM

## 2010-08-19 DIAGNOSIS — Z01818 Encounter for other preprocedural examination: Secondary | ICD-10-CM

## 2010-08-19 DIAGNOSIS — I10 Essential (primary) hypertension: Secondary | ICD-10-CM | POA: Diagnosis present

## 2010-08-19 DIAGNOSIS — I743 Embolism and thrombosis of arteries of the lower extremities: Principal | ICD-10-CM | POA: Diagnosis present

## 2010-08-19 DIAGNOSIS — G4733 Obstructive sleep apnea (adult) (pediatric): Secondary | ICD-10-CM | POA: Diagnosis present

## 2010-08-19 DIAGNOSIS — I70219 Atherosclerosis of native arteries of extremities with intermittent claudication, unspecified extremity: Secondary | ICD-10-CM

## 2010-08-19 DIAGNOSIS — I959 Hypotension, unspecified: Secondary | ICD-10-CM | POA: Diagnosis not present

## 2010-08-19 DIAGNOSIS — J449 Chronic obstructive pulmonary disease, unspecified: Secondary | ICD-10-CM | POA: Diagnosis present

## 2010-08-19 DIAGNOSIS — N179 Acute kidney failure, unspecified: Secondary | ICD-10-CM | POA: Diagnosis not present

## 2010-08-19 DIAGNOSIS — J4489 Other specified chronic obstructive pulmonary disease: Secondary | ICD-10-CM | POA: Diagnosis present

## 2010-08-19 DIAGNOSIS — E2749 Other adrenocortical insufficiency: Secondary | ICD-10-CM | POA: Diagnosis present

## 2010-08-19 DIAGNOSIS — F17200 Nicotine dependence, unspecified, uncomplicated: Secondary | ICD-10-CM | POA: Diagnosis present

## 2010-08-19 DIAGNOSIS — R000 Tachycardia, unspecified: Secondary | ICD-10-CM | POA: Diagnosis not present

## 2010-08-19 HISTORY — PX: FEMORAL BYPASS: SHX50

## 2010-08-19 LAB — GLUCOSE, CAPILLARY: Glucose-Capillary: 104 mg/dL — ABNORMAL HIGH (ref 70–99)

## 2010-08-20 DIAGNOSIS — I739 Peripheral vascular disease, unspecified: Secondary | ICD-10-CM

## 2010-08-20 LAB — GLUCOSE, CAPILLARY
Glucose-Capillary: 149 mg/dL — ABNORMAL HIGH (ref 70–99)
Glucose-Capillary: 129 mg/dL — ABNORMAL HIGH (ref 70–99)

## 2010-08-20 LAB — BASIC METABOLIC PANEL
CO2: 29 mEq/L (ref 19–32)
GFR calc non Af Amer: >60 mL/min (ref >60)
Glucose, Bld: 121 mg/dL — ABNORMAL HIGH (ref 70–99)
Potassium: 4.1 mEq/L (ref 3.5–5.1)
Sodium: 138 mEq/L (ref 135–145)

## 2010-08-20 LAB — CBC
Hemoglobin: 10.7 g/dL — ABNORMAL LOW (ref 13.0–17.0)
Platelets: 157 10*3/uL (ref 150–400)
RBC: 3.78 MIL/uL — ABNORMAL LOW (ref 4.22–5.81)

## 2010-08-20 LAB — HEMOGLOBIN A1C
Hgb A1c MFr Bld: 6.3 % — ABNORMAL HIGH (ref <5.7)
Mean Plasma Glucose: 134 mg/dL — ABNORMAL HIGH (ref <117)

## 2010-08-21 ENCOUNTER — Inpatient Hospital Stay (HOSPITAL_COMMUNITY): Payer: Medicaid Other

## 2010-08-21 LAB — GLUCOSE, CAPILLARY: Glucose-Capillary: 162 mg/dL — ABNORMAL HIGH (ref 70–99)

## 2010-08-21 LAB — CARDIAC PANEL(CRET KIN+CKTOT+MB+TROPI)
CK, MB: 1.2 ng/mL (ref 0.3–4.0)
Relative Index: INVALID (ref 0.0–2.5)
Total CK: 19 U/L (ref 7–232)
Troponin I: <0.3 ng/mL (ref <0.30)

## 2010-08-21 LAB — NA AND K (SODIUM & POTASSIUM), RAND UR: Potassium Urine: 52 mEq/L

## 2010-08-21 LAB — POCT I-STAT 3, ART BLOOD GAS (G3+)
Acid-Base Excess: 1 mmol/L (ref 0.0–2.0)
Bicarbonate: 25.2 mEq/L — ABNORMAL HIGH (ref 20.0–24.0)
O2 Saturation: 91 %
TCO2: 26 mmol/L (ref 0–100)

## 2010-08-21 LAB — CORTISOL: Cortisol, Plasma: 6.7 ug/dL

## 2010-08-21 LAB — BASIC METABOLIC PANEL
BUN: 22 mg/dL (ref 6–23)
Chloride: 99 mEq/L (ref 96–112)
GFR calc Af Amer: 37 mL/min — ABNORMAL LOW (ref >60)
GFR calc non Af Amer: 31 mL/min — ABNORMAL LOW (ref >60)
Potassium: 3.7 mEq/L (ref 3.5–5.1)
Sodium: 135 mEq/L (ref 135–145)

## 2010-08-21 LAB — CBC
HCT: 30 % — ABNORMAL LOW (ref 39.0–52.0)
Hemoglobin: 10 g/dL — ABNORMAL LOW (ref 13.0–17.0)
MCV: 87 fL (ref 78.0–100.0)
RDW: 13.8 % (ref 11.5–15.5)
WBC: 9.1 10*3/uL (ref 4.0–10.5)

## 2010-08-21 NOTE — Op Note (Signed)
NAMEHIDEO, GOOGE NO.:  [id number]  MEDICAL RECORD NO.:  [id number]  LOCATION:  3309                         FACILITY:  [facility]  PHYSICIAN:  Di Kindle. Edilia Bo, M.D.DATE OF BIRTH:  02-06-1951  DATE OF PROCEDURE:  08/19/2010 DATE OF DISCHARGE:                              OPERATIVE REPORT   PREOPERATIVE DIAGNOSIS:  Right superficial femoral artery occlusive disease  POSTOPERATIVE DIAGNOSIS:  Right superficial femoral artery occlusive disease.  PROCEDURES:  Right femoral to above-knee popliteal artery bypass graft with a 6-mm Propaten graft and intraoperative arteriogram x1.  SURGEON:  Di Kindle. Edilia Bo, MD  ASSISTANT:  Pecola Leisure, PA  ANESTHESIA:  General.  INDICATIONS:  This is a 60 year old gentleman who had an aortobifemoral bypass graft back in the late 90s.  He was subsequently lost to follow up.  He presented with bilateral lower extremity claudication and underwent attempted endovascular revascularization by Dr. Allyson Sabal.  This was not technically possible, so therefore he was sent for evaluation for fem-pop bypass grafting.  We discussed the option of tobacco cessation and a structured walking program.  However, he felt that his symptoms were quite disabling and wished to proceed revascularization. He was at an increased risk for leg swelling and wound healing problems given his obesity, diabetes and history of DVT in the right leg. However he understood these risks and wished to proceed.  Of note, he underwent a preoperative vein mapping which showed that there were areas of chronic thrombus within the saphenous vein and also on my examination in the operating room the vein became bifid and quite small in the mid thigh.  I felt that the vein was not usable for a bypass conduit.  TECHNIQUE:  The patient was taken to the operating room and the pannus was taped superiorly.  A longitudinal incision was made in the right groin where  I dissected the common femoral artery below the right limb of the aortobifemoral bypass graft.  This had an excellent pulse here and I was able to control the superficial femoral and deep femoral arteries with vessel loops.  Next, a medial incision was made longitudinally above the knee and through this incision dissection was carried down to the above-knee popliteal artery which was quite deep but patent.  The tunnel was created between the 2 incisions and the patient was heparinized.  A 6-mm probe patent graft was tunneled between the 2 incisions.  The common femoral, superficial femoral and deep femoral arteries were controlled and longitudinal arteriotomy made in the common femoral artery.  The PTFE graft was spatulated and sewn end-to-side to the common femoral artery using continuous 6-0 Prolene suture.  Of note, I did place a radiopaque marker around the proximal anastomosis at the heel.  The graft sewn to the appropriate length for anastomosis to the above-knee popliteal artery.  A tourniquet had been placed on the thigh. The leg was exsanguinated with an Esmarch bandage and the tourniquet inflated to 300 mmHg.  Under tourniquet control, a longitudinal arteriotomy was made in the above-knee popliteal artery.  The grafts cut to the appropriate length, spatulated and sewn end to side to the above- knee popliteal artery using continuous 6-0 Prolene suture.  Prior to completing this anastomosis, the tourniquet was released.  The artery back bled and flushed appropriately and the anastomosis completed.  Flow was reestablished to the right leg and there was an excellent posterior tibial signal at the completion of procedure.  The heparin was partially reversed with protamine.  Intraoperative arteriogram was obtained which showed no technical problems.  Two-vessel runoff via the peroneal and posterior tibial arteries.  The wounds of each closed with a deep layer of 2-0 Vicryl,  subcutaneous layer of 3-0 Vicryl, and the skin closed with a 4-0 subcuticular stitch.  Sterile dressing was applied.  The patient tolerated the procedure well and was transferred to the recovery room in stable condition.  All needle and sponge counts were correct.     Di Kindle. Edilia Bo, M.D.     CSD/MEDQ  D:  08/19/2010  T:  08/20/2010  Job:  [id number]  cc:   Landry Corporal, MD  Electronically Signed by Waverly Ferrari M.D. on 08/21/2010 11:30:35 AM

## 2010-08-22 ENCOUNTER — Inpatient Hospital Stay (HOSPITAL_COMMUNITY): Payer: Medicaid Other

## 2010-08-22 LAB — CBC
MCV: 87.1 fL (ref 78.0–100.0)
Platelets: 159 10*3/uL (ref 150–400)
RBC: 3.42 MIL/uL — ABNORMAL LOW (ref 4.22–5.81)
RDW: 13.6 % (ref 11.5–15.5)
WBC: 7.5 10*3/uL (ref 4.0–10.5)

## 2010-08-22 LAB — PHOSPHORUS: Phosphorus: 2.1 mg/dL — ABNORMAL LOW (ref 2.3–4.6)

## 2010-08-22 LAB — GLUCOSE, CAPILLARY
Glucose-Capillary: 151 mg/dL — ABNORMAL HIGH (ref 70–99)
Glucose-Capillary: 118 mg/dL — ABNORMAL HIGH (ref 70–99)

## 2010-08-22 LAB — COMPREHENSIVE METABOLIC PANEL
ALT: 10 U/L (ref 0–53)
AST: 12 U/L (ref 0–37)
Albumin: 2.7 g/dL — ABNORMAL LOW (ref 3.5–5.2)
CO2: 24 mEq/L (ref 19–32)
Calcium: 9.2 mg/dL (ref 8.4–10.5)
Chloride: 107 mEq/L (ref 96–112)
Creatinine, Ser: 0.68 mg/dL (ref 0.50–1.35)
GFR calc non Af Amer: >60 mL/min (ref >60)
Sodium: 140 mEq/L (ref 135–145)

## 2010-08-23 LAB — PHOSPHORUS: Phosphorus: 3.1 mg/dL (ref 2.3–4.6)

## 2010-08-23 LAB — CBC
MCH: 28.7 pg (ref 26.0–34.0)
MCHC: 32.8 g/dL (ref 30.0–36.0)
MCV: 87.5 fL (ref 78.0–100.0)
Platelets: 184 10*3/uL (ref 150–400)
RDW: 13.8 % (ref 11.5–15.5)

## 2010-08-23 LAB — GLUCOSE, CAPILLARY
Glucose-Capillary: 132 mg/dL — ABNORMAL HIGH (ref 70–99)
Glucose-Capillary: 139 mg/dL — ABNORMAL HIGH (ref 70–99)

## 2010-08-23 LAB — BASIC METABOLIC PANEL
Calcium: 9.6 mg/dL (ref 8.4–10.5)
Creatinine, Ser: 0.7 mg/dL (ref 0.50–1.35)
GFR calc Af Amer: >60 mL/min (ref >60)
GFR calc non Af Amer: >60 mL/min (ref >60)
Sodium: 143 mEq/L (ref 135–145)

## 2010-08-23 LAB — MAGNESIUM: Magnesium: 2.2 mg/dL (ref 1.5–2.5)

## 2010-08-25 NOTE — Discharge Summary (Addendum)
NAMEROSBEL, BUCKNER NO.:  [id number]  MEDICAL RECORD NO.:  [id number]  LOCATION:  5525                         FACILITY:  [facility]  PHYSICIAN:  C. Ulyess Mort, M.D.DATE OF BIRTH:  02/22/1950  DATE OF ADMISSION:  08/08/2010 DATE OF DISCHARGE:  08/11/2010                              DISCHARGE SUMMARY   ATTENDING PHYSICIAN:  C. Ulyess Mort, MD  DISCHARGE DIAGNOSES: 1. Pneumonia. 2. Chronic obstructive pulmonary disease. 3. Hypertension. 4. History of deep venous thrombosis. 5. Hyperlipidemia. 6. Peripheral artery disease. 7. Osteoarthritis. 8. Polysubstance abuse.  DISCHARGE MEDICATIONS:  New medications: 1. Azithromycin 500 mg by mouth daily to be taken for 3 more days. 2. Cefixime 400 mg by mouth daily to be taken for 3 more days. 3. Clopidogrel 75 mg by mouth daily with meals.  The following medications are all to be continued on the patient's home regimen, the previous 3 were all new medications: 1. Acetaminophen 325 mg 2 tablets by mouth every 4 hours as needed for     pain. 2. Cilostazol 100 mg by mouth twice daily. 3. Furosemide 20 mg by mouth twice daily. 4. Hydrochlorothiazide 12.5 mg by mouth daily. 5. Paliperidone 3 mg by mouth daily. 6. Lovastatin 40 mg by mouth daily at bedtime. 7. Lisinopril 10 mg 1 tablet by mouth daily. 8. Metoprolol tartrate 25 mg 1 tablet by mouth twice daily. 9. Mirtazapine 15 mg 1/2 tablet by mouth daily at bedtime. 10.Omeprazole 20 mg 1 capsule by mouth daily. 11.Methocarbamol 750 mg 1 tablet by mouth daily as needed.  DISPOSITION AND FOLLOWUP:  Mr. Calk was discharged from [facility] on August 11, 2010 in stable and improved condition.  He had no fever, nausea, or vomiting.  He states he was feeling much better with decreased shortness of breath and his pain was well controlled with Tylenol and naproxen.  He will have an appointment in the [facility] with Dr. Yaakov Guthrie  on July 9 at 8:30.  At that time he should be evaluated for resolution of his pneumonia.  CONSULTATIONS:  None.  PROCEDURES PERFORMED:  Plain radiograph of the chest was significant for prominent bilateral pericardial fat pad.  There is some superimposed patchy airspace disease in the right base on today's study.  This could be secondary to atelectasis or pneumonia.  EKG performed on June 29 was normal.  ADMISSION HISTORY:  Mr. Cumbie is a 60 year old man who presented to [facility] ER with complaints of cough for the last few days.  He states that he started to have increased cough Tuesday morning.  He is chronically short of breath due to longstanding COPD and continued to have tobacco use up to 2 packs per day.  He noted that he had some blood tinged sputum, but denied any color change of his sputum, increased production of sputum, or increased shortness of breath.  He denied fevers, chills, nausea, vomiting, diarrhea, or abdominal pain.  He does endorse some bilateral lower chest pain that started today after he had had a long coughing fit.  Denies any sick contacts.  He states that he has been inconsistent with his use of inhalers for COPD.  Of  note he is on chronic Coumadin therapy for history of DVT and severe peripheral artery disease.  However, he is scheduled for femoral bypass surgery on August 19, 2010 by Dr. Herbie Baltimore and was told to hold his Coumadin prior to this procedure.  ADMISSION LABS:  White blood cell 8.7, hemoglobin 13.9, hematocrit 42.2, platelets 179,000.  Sodium 140, potassium 4.3, chloride 105, bicarbonate 29, BUN 14, creatinine 8.8, glucose 89, calcium was 9.8, total protein 6.9, albumin was 3.4, AST was 17, ALT 14, alk phos 64, total bilirubin 0.3.  INR was 1.2, PTT was 33.  HOSPITAL COURSE: 1. Pneumonia.  There was concern for possible right lower lobe     pneumonia given the patient's symptoms and chest radiographic     findings.  He was begun on IV  antibiotics for community-acquired     pneumonia, IV Rocephin, and azithromycin.  Blood cultures were not     checked given normal white blood cell count and lack of fever.     Urine strep pneumo titers as well as Legionella antigen were sent     as well as sputum Gram stain and culture.  Cardiac enzymes were     checked x3 given chest pain.  Mr. Sedore rapidly improved after     admission to the hospital.  He remained afebrile during his     hospitalization.  On June 30, he was feeling much better with     improved cough and only mild shortness of breath.  White count     remained low at 6.3.  Strep pneumo antigen was negative.  On August 10, 2010, he reported feeling continually better with only a dry     cough.  He felt a little bit of chest tightness, though felt this     was a good day compared to his baseline for COPD.  No fevers,     chills, nausea, vomiting, or other pain.  He continued to improve     until the day of discharge where he only endorsed slight cough and     baseline chest tightness for COPD.  On July 1, he was changed to     oral azithromycin 500 mg a day as well as ceftibuten to finish the     course of a total of 1-week therapy for pneumonia.  Upon discharge,     he received prescriptions for cefixime and azithromycin for further     3 days of therapy to complete his 1-week course of antibiotics. 2. COPD.  Mr. Pinney was maintained on his home dose of inhaler     therapy.  He reported rapid improvement in his chest tightness.     His O2 sat was above 95% during his entire admission. 3. Hypertension.  Mr. Mercier hypertension was well controlled and     his home meds continued during his hospitalization.  Blood     pressures were well controlled on this regimen. 4. History of DVT.  Mr. Maqueda was placed on Lovenox as well as     clopidogrel to prevent clot during his hospitalization.  His     warfarin was held as he was having surgery on July 10.  He had  no     evidence of DVT during his hospitalization. 5. Hyperlipidemia.  Mr. Hylton was maintained on his home dose of     statin. 6. Peripheral artery disease.  Plavix and Lovenox were continued. 7. Osteoarthritis.  Mr. Laser stated that  he had some aching knee     pain on hospital day 2.  He was given naproxen and Tylenol which     relieved his symptoms. 8. Polysubstance abuse.  Social work was consulted to assist the     patient with further psychological flirt.  DISCHARGE VITALS:  Afebrile, pulse 76, respirations 18, blood pressure 130/71, O2 sat 98% on room air.  DISCHARGE LABS:  Sodium 142, potassium 3.9, chloride 106, bicarb 24, BUN 11, creatinine 0.79, glucose 135, calcium 9.9, magnesium 2.0. Legionella urinary antigen was negative.  Urinalysis was negative for glucose, protein, nitrite, and leukocyte esterase.  It was slightly positive for ketones of 15.  Urine drug screen was negative for benzodiazepines, cocaine, amphetamines, THC, opiates, and barbiturates. Lipid panel:  Total cholesterol 173, triglycerides 145, HDL was low at 31, LDL cholesterol was 113, VLDL was 29, total cholesterol HDL ratio was 5.6.  Cardiac markers were negative x3 with the exception of a single value on June 29 of troponin I just slightly above normal at 0.32.  Remaining 2 cardiac markers thereafter were less than 0.3.    ______________________________ Quentin Ore, MD   ______________________________ C. Ulyess Mort, M.D.    MR/MEDQ  D:  08/24/2010  T:  08/24/2010  Job:  [id number]  Electronically Signed by Eliezer Lofts M.D. on 08/25/2010 04:22:15 PM Electronically Signed by Quentin Ore MD on 09/03/2010 03:55:40 PM

## 2010-08-28 ENCOUNTER — Encounter: Payer: Self-pay | Admitting: Vascular Surgery

## 2010-09-03 ENCOUNTER — Encounter: Payer: Self-pay | Admitting: Vascular Surgery

## 2010-09-03 ENCOUNTER — Ambulatory Visit (INDEPENDENT_AMBULATORY_CARE_PROVIDER_SITE_OTHER): Payer: Medicaid Other | Admitting: Vascular Surgery

## 2010-09-03 VITALS — BP 122/76 | HR 93 | Temp 97.9°F | Ht 72.0 in | Wt 261.0 lb

## 2010-09-03 DIAGNOSIS — L98499 Non-pressure chronic ulcer of skin of other sites with unspecified severity: Secondary | ICD-10-CM

## 2010-09-03 NOTE — Progress Notes (Signed)
[name] is a 60 y.o. male patient. 1. Atherosclerosis of native arteries of the extremities with ulceration    Past Medical History  Diagnosis Date  . Depression   . Hypertension   . Hyperlipidemia   . Myocardial infarction 2000  . CHF (congestive heart failure)   . COPD (chronic obstructive pulmonary disease)   . Peripheral vascular disease, unspecified   . GERD (gastroesophageal reflux disease)   . Asthma   . Anxiety   . History of DVT of lower extremity   . CAD (coronary artery disease)    Current Outpatient Prescriptions  Medication Sig Dispense Refill  . clopidogrel (PLAVIX) 75 MG tablet Take 75 mg by mouth daily.        [name] lovastatin (MEVACOR) 40 MG tablet Take 40 mg by mouth 2 (two) times daily.        . methocarbamol (ROBAXIN) 750 MG tablet Take 750 mg by mouth as needed.        . mirtazapine (REMERON) 15 MG tablet Take 15 mg by mouth at bedtime.        [name] omeprazole (PRILOSEC) 40 MG capsule Take 40 mg by mouth daily.        [name] oxycodone (OXY-IR) 5 MG capsule Take 5 mg by mouth every 6 (six) hours as needed.        . Paliperidone (INVEGA PO) Take by mouth.        . furosemide (LASIX) 20 MG tablet Take 20 mg by mouth daily.        [name] HYDROcodone-acetaminophen (VICODIN) 5-500 MG per tablet Take 1 tablet by mouth every 6 (six) hours as needed.        [name] LISINOPRIL PO Take by mouth.        . Warfarin Sodium (COUMADIN PO) Take by mouth as directed.         Allergies  Allergen Reactions  . Darvocet (Propoxyphene N-Acetaminophen) Hives  . Haldol (Haloperidol Decanoate) Hives   Active Problems:  * No active hospital problems. *   Blood pressure 122/76, pulse 93, temperature 97.9 F (36.6 C), temperature source Oral, height 6' (1.829 m), weight 261 lb (118.389 kg).  Subjective This is a 60 year old gentleman who had an aortobifemoral bypass graft in the late 90s. He was then lost to followup. He has bilateral infrainguinal arterial occlusive disease. Dr. York Ram  attempted an endovascular approach to his right superficial femoral artery occlusive disease but this was not technically possible. He was therefore referred for bypass surgery. Preoperative vein mapping showed evidence of chronic thrombus within the saphenous vein on the right. In addition the saphenous vein was bifid and was fairly small.  He underwent a right femoral to above-knee popliteal artery bypass graft with a 6 mm propaten graft on 08/19/2010. He returns for his first outpatient followup visit. Overall he been doing quite well and has no specific complaints. His claudication symptoms in his right leg have improved significantly. Objective On physical examination, his groin incision and thigh incision had healed nicely. He has a palpable right popliteal pulse and a warm well-perfused right foot. He has moderate right lower extremity swelling. Assessment & Plan Overall I am pleased with his progress. I have ordered a arterial duplex of his graft in 6 months and ABIs. I will see him back at that time. He knows to call sooner if he has problems. In addition we have had a long discussion about the importance of tobacco cessation. Hopefully we can avoid revascularization  on the left leg. I've explained that with each surgery there is some risk of infecting his aortic graft which would be a devastating complication.  [name] 09/03/2010

## 2010-09-03 NOTE — Progress Notes (Signed)
Postop Right Fem-pop on 08-19-10.

## 2010-09-08 NOTE — Discharge Summary (Signed)
Dr. Marvetta Gibbons was admitted complaints of cough and COPD exacerbation with concern for pneumonia.  Please follow up on the following: #1: Resolution of pneumonia #2: Blood pressure control #3: Resumption of Coumadin after surgery on July 10

## 2010-11-12 LAB — BASIC METABOLIC PANEL
BUN: 11
CO2: 24
Chloride: 107
Creatinine, Ser: 0.96
Glucose, Bld: 104 — ABNORMAL HIGH

## 2010-11-12 LAB — RAPID URINE DRUG SCREEN, HOSP PERFORMED
Barbiturates: NOT DETECTED
Opiates: NOT DETECTED
Tetrahydrocannabinol: NOT DETECTED

## 2010-11-12 LAB — ETHANOL: Alcohol, Ethyl (B): 68 — ABNORMAL HIGH

## 2010-11-12 LAB — DIFFERENTIAL
Basophils Relative: 0
Eosinophils Absolute: 0.2
Neutrophils Relative %: 56

## 2010-11-12 LAB — CBC
MCHC: 35.2
MCV: 94.3
Platelets: 160
RDW: 13.6

## 2010-11-13 LAB — URINALYSIS, ROUTINE W REFLEX MICROSCOPIC
Glucose, UA: NEGATIVE mg/dL
Ketones, ur: NEGATIVE mg/dL
Nitrite: NEGATIVE
Protein, ur: NEGATIVE mg/dL
Urobilinogen, UA: 0.2 mg/dL (ref 0.0–1.0)

## 2010-11-13 LAB — COMPREHENSIVE METABOLIC PANEL
ALT: 36 U/L (ref 0–53)
CO2: 25 mEq/L (ref 19–32)
Calcium: 9.1 mg/dL (ref 8.4–10.5)
Chloride: 103 mEq/L (ref 96–112)
GFR calc non Af Amer: >60 mL/min (ref >60)
Glucose, Bld: 103 mg/dL — ABNORMAL HIGH (ref 70–99)
Sodium: 136 mEq/L (ref 135–145)
Total Bilirubin: 0.7 mg/dL (ref 0.3–1.2)

## 2010-11-13 LAB — CBC
Hemoglobin: 15.8 g/dL (ref 13.0–17.0)
MCHC: 33.5 g/dL (ref 30.0–36.0)
MCV: 95 fL (ref 78.0–100.0)
RBC: 4.97 MIL/uL (ref 4.22–5.81)
WBC: 7 10*3/uL (ref 4.0–10.5)

## 2010-11-13 LAB — DIFFERENTIAL
Basophils Absolute: 0 10*3/uL (ref 0.0–0.1)
Basophils Relative: 1 % (ref 0–1)
Eosinophils Absolute: 0.2 10*3/uL (ref 0.0–0.7)
Eosinophils Relative: 3 % (ref 0–5)
Lymphs Abs: 2.6 10*3/uL (ref 0.7–4.0)
Neutrophils Relative %: 49 % (ref 43–77)

## 2010-11-13 LAB — RPR: RPR Ser Ql: NONREACTIVE

## 2010-11-13 LAB — VITAMIN B12: Vitamin B-12: 412 pg/mL

## 2010-11-13 LAB — RAPID URINE DRUG SCREEN, HOSP PERFORMED: Barbiturates: NOT DETECTED

## 2010-11-13 LAB — TSH: TSH: 2.013 u[IU]/mL (ref 0.350–4.500)

## 2010-11-20 LAB — POCT CARDIAC MARKERS
CKMB, poc: <1 — ABNORMAL LOW
CKMB, poc: <1 — ABNORMAL LOW
Myoglobin, poc: 76.3
Operator id: 151321
Troponin i, poc: <0.05
Troponin i, poc: <0.05
Troponin i, poc: <0.05

## 2010-11-20 LAB — CARDIAC PANEL(CRET KIN+CKTOT+MB+TROPI)
CK, MB: 1.2
CK, MB: 1.3
Relative Index: INVALID
Troponin I: 0.02
Troponin I: 0.02

## 2010-11-20 LAB — DIFFERENTIAL
Lymphocytes Relative: 32
Lymphs Abs: 2.2
Monocytes Absolute: 0.7
Monocytes Relative: 10
Neutro Abs: 3.7
Neutrophils Relative %: 54

## 2010-11-20 LAB — COMPREHENSIVE METABOLIC PANEL
Albumin: 3.8
BUN: 6
Calcium: 9.7
Glucose, Bld: 99
Total Protein: 6.8

## 2010-11-20 LAB — CK TOTAL AND CKMB (NOT AT ARMC)
CK, MB: 1.5
Relative Index: INVALID
Total CK: 38

## 2010-11-20 LAB — BASIC METABOLIC PANEL
BUN: 5 — ABNORMAL LOW
Creatinine, Ser: 0.95
GFR calc non Af Amer: >60
Glucose, Bld: 86
Potassium: 4

## 2010-11-20 LAB — CBC
HCT: 44.5
HCT: 46.5
Hemoglobin: 15.7
MCHC: 33.9
Platelets: 179
Platelets: 218
RDW: 13.4
RDW: 13.8

## 2010-11-20 LAB — LIPID PANEL: Triglycerides: 208 — ABNORMAL HIGH

## 2010-11-20 LAB — B-NATRIURETIC PEPTIDE (CONVERTED LAB): Pro B Natriuretic peptide (BNP): 65

## 2010-11-20 LAB — APTT: aPTT: 30

## 2010-11-20 LAB — PROTIME-INR: Prothrombin Time: 13.2

## 2011-01-02 ENCOUNTER — Ambulatory Visit: Payer: Medicaid Other | Admitting: Internal Medicine

## 2011-01-02 ENCOUNTER — Encounter (HOSPITAL_COMMUNITY): Payer: Self-pay | Admitting: *Deleted

## 2011-01-02 ENCOUNTER — Emergency Department (HOSPITAL_COMMUNITY)
Admission: EM | Admit: 2011-01-02 | Discharge: 2011-01-02 | Disposition: A | Discharge status: Home / Self Care | Payer: Medicaid Other | Attending: Emergency Medicine | Admitting: Emergency Medicine

## 2011-01-02 ENCOUNTER — Emergency Department (HOSPITAL_COMMUNITY): Payer: Medicaid Other

## 2011-01-02 DIAGNOSIS — Z79899 Other long term (current) drug therapy: Secondary | ICD-10-CM | POA: Insufficient documentation

## 2011-01-02 DIAGNOSIS — F17200 Nicotine dependence, unspecified, uncomplicated: Secondary | ICD-10-CM | POA: Insufficient documentation

## 2011-01-02 DIAGNOSIS — K219 Gastro-esophageal reflux disease without esophagitis: Secondary | ICD-10-CM | POA: Insufficient documentation

## 2011-01-02 DIAGNOSIS — R5381 Other malaise: Secondary | ICD-10-CM | POA: Insufficient documentation

## 2011-01-02 DIAGNOSIS — E785 Hyperlipidemia, unspecified: Secondary | ICD-10-CM | POA: Insufficient documentation

## 2011-01-02 DIAGNOSIS — I10 Essential (primary) hypertension: Secondary | ICD-10-CM | POA: Insufficient documentation

## 2011-01-02 DIAGNOSIS — M79609 Pain in unspecified limb: Secondary | ICD-10-CM | POA: Insufficient documentation

## 2011-01-02 DIAGNOSIS — I739 Peripheral vascular disease, unspecified: Secondary | ICD-10-CM | POA: Insufficient documentation

## 2011-01-02 DIAGNOSIS — I2510 Atherosclerotic heart disease of native coronary artery without angina pectoris: Secondary | ICD-10-CM | POA: Insufficient documentation

## 2011-01-02 DIAGNOSIS — F341 Dysthymic disorder: Secondary | ICD-10-CM | POA: Insufficient documentation

## 2011-01-02 DIAGNOSIS — M47817 Spondylosis without myelopathy or radiculopathy, lumbosacral region: Secondary | ICD-10-CM | POA: Insufficient documentation

## 2011-01-02 DIAGNOSIS — I252 Old myocardial infarction: Secondary | ICD-10-CM | POA: Insufficient documentation

## 2011-01-02 DIAGNOSIS — M545 Low back pain, unspecified: Secondary | ICD-10-CM | POA: Insufficient documentation

## 2011-01-02 DIAGNOSIS — J449 Chronic obstructive pulmonary disease, unspecified: Secondary | ICD-10-CM | POA: Insufficient documentation

## 2011-01-02 DIAGNOSIS — J4489 Other specified chronic obstructive pulmonary disease: Secondary | ICD-10-CM | POA: Insufficient documentation

## 2011-01-02 DIAGNOSIS — M47816 Spondylosis without myelopathy or radiculopathy, lumbar region: Secondary | ICD-10-CM

## 2011-01-02 DIAGNOSIS — I509 Heart failure, unspecified: Secondary | ICD-10-CM | POA: Insufficient documentation

## 2011-01-02 DIAGNOSIS — Z86718 Personal history of other venous thrombosis and embolism: Secondary | ICD-10-CM | POA: Insufficient documentation

## 2011-01-02 MED ORDER — OXYCODONE-ACETAMINOPHEN 5-325 MG PO TABS
2.0000 | ORAL_TABLET | Freq: Once | ORAL | Status: AC
  Administered 2011-01-02: 2 via ORAL
  Filled 2011-01-02: qty 2

## 2011-01-02 MED ORDER — OXYCODONE-ACETAMINOPHEN 7.5-500 MG PO TABS: 1.0000 | ORAL_TABLET | ORAL | Status: AC | PRN

## 2011-01-02 MED ORDER — PREDNISONE 10 MG PO TABS: 20.0000 mg | ORAL_TABLET | Freq: Every day | ORAL | Status: DC

## 2011-01-02 MED ORDER — PREDNISONE 10 MG PO TABS: 20.0000 mg | ORAL_TABLET | Freq: Every day | ORAL | Status: AC

## 2011-01-02 MED ORDER — OXYCODONE-ACETAMINOPHEN 7.5-500 MG PO TABS: 1.0000 | ORAL_TABLET | ORAL | Status: DC | PRN

## 2011-01-02 NOTE — ED Notes (Signed)
Provider at bedside

## 2011-01-02 NOTE — ED Provider Notes (Signed)
Medical screening examination/treatment/procedure(s) were performed by non-physician practitioner and as supervising physician I was immediately available for consultation/collaboration.   Calvin Chura A Avry Monteleone, MD 01/02/11 2012 

## 2011-01-02 NOTE — ED Notes (Signed)
Called pt to come back to 28, answer

## 2011-01-02 NOTE — ED Notes (Signed)
Pt returned from xray. Quick steady gait noted.

## 2011-01-02 NOTE — ED Notes (Signed)
The pt has had lower back pain and pain in both his legs and in his rt anterior thigh.  No known injury.  He has not fallen but caught himself before he fell.

## 2011-01-02 NOTE — ED Provider Notes (Signed)
History     CSN: [id number] Arrival date & time: 01/02/2011  3:37 PM   First MD Initiated Contact with Patient 01/02/11 1815      Chief Complaint  Patient presents with  . Back Pain    (Consider location/radiation/quality/duration/timing/severity/associated sxs/prior treatment) Patient is a 60 y.o. male presenting with back pain. The history is provided by the patient. No language interpreter was used.  Back Pain  This is a recurrent problem. The current episode started more than 1 week ago. The problem occurs daily. The problem has been gradually worsening. The pain is associated with no known injury. The pain is present in the lumbar spine. The quality of the pain is described as stabbing and aching. The pain radiates to the left thigh. The pain is moderate. The symptoms are aggravated by bending and certain positions. The pain is worse during the night. Associated symptoms include weakness. Pertinent negatives include no bowel incontinence, no bladder incontinence and no paresthesias. He has tried analgesics for the symptoms. The treatment provided no relief.  Patient states he sees a Dr. Wallace Cullens with [facility], but does not feel she is addressing his pain issues.  Past Medical History  Diagnosis Date  . Depression   . Hypertension   . Hyperlipidemia   . Myocardial infarction 2000  . CHF (congestive heart failure)   . COPD (chronic obstructive pulmonary disease)   . Peripheral vascular disease, unspecified   . GERD (gastroesophageal reflux disease)   . Asthma   . Anxiety   . History of DVT of lower extremity   . CAD (coronary artery disease)     Past Surgical History  Procedure Date  . Femoral bypass 08/19/10    Right Fem-Pop  . Aortogram w/ ptca 09/292003    History reviewed. No pertinent family history.  History  Substance Use Topics  . Smoking status: Current Everyday Smoker -- 1.5 packs/day  . Smokeless tobacco: Not on file  . Alcohol Use: No       Review of Systems  Gastrointestinal: Negative for bowel incontinence.  Genitourinary: Negative for bladder incontinence.  Musculoskeletal: Positive for back pain.  Neurological: Positive for weakness. Negative for paresthesias.  All other systems reviewed and are negative.    Allergies  Darvocet and Haldol  Home Medications   Current Outpatient Rx  Name Route Sig Dispense Refill  . CILOSTAZOL 100 MG PO TABS Oral Take 100 mg by mouth 2 (two) times daily.      [name] CLOPIDOGREL BISULFATE 75 MG PO TABS Oral Take 75 mg by mouth daily.      . DULOXETINE HCL 60 MG PO CPEP Oral Take 60 mg by mouth daily.      . FUROSEMIDE 40 MG PO TABS Oral Take 80 mg by mouth daily.      [name] LOVASTATIN 40 MG PO TABS Oral Take 40 mg by mouth 2 (two) times daily.      [name] METHOCARBAMOL 750 MG PO TABS Oral Take 750 mg by mouth as needed. For pain    . METOPROLOL TARTRATE 25 MG PO TABS Oral Take 25 mg by mouth 2 (two) times daily.      [name] OMEPRAZOLE 20 MG PO CPDR Oral Take 20 mg by mouth daily.      [name] POTASSIUM CHLORIDE CRYS CR 20 MEQ PO TBCR Oral Take 20 mEq by mouth daily.      . TRAZODONE HCL 150 MG PO TABS Oral Take 150-300 mg by mouth at bedtime as needed.  For sleep     . OXYCODONE HCL 5 MG PO CAPS Oral Take 5 mg by mouth every 6 (six) hours as needed. For pain      BP 126/68  Pulse 67  Temp(Src) 97.3 F (36.3 C) (Oral)  Resp 19  SpO2 100%  Physical Exam  Nursing note and vitals reviewed. Constitutional: He is oriented to person, place, and time. He appears well-developed and well-nourished.  HENT:  Head: Normocephalic.  Eyes: Pupils are equal, round, and reactive to light.  Neck: Normal range of motion. Neck supple.  Cardiovascular: Normal rate, regular rhythm and normal heart sounds.   Pulmonary/Chest: Effort normal and breath sounds normal.  Abdominal: Soft. Bowel sounds are normal.  Musculoskeletal:       Lumbar back: He exhibits pain.  Neurological: He is alert and oriented to person,  place, and time.  Skin: Skin is warm and dry.  Psychiatric: He has a normal mood and affect.    ED Course  Procedures (including critical care time)  Labs Reviewed - No data to display No results found.   No diagnosis found.    MDM  Back pain        Jimmye Norman, NP 01/02/11 [phone]

## 2011-01-02 NOTE — ED Notes (Addendum)
Patient ambulated easily  to X-ray with transporter.

## 2011-03-10 ENCOUNTER — Encounter: Payer: Self-pay | Admitting: Vascular Surgery

## 2011-03-10 ENCOUNTER — Ambulatory Visit: Payer: Medicaid Other | Attending: Nurse Practitioner | Admitting: Physical Therapy

## 2011-03-10 DIAGNOSIS — IMO0001 Reserved for inherently not codable concepts without codable children: Secondary | ICD-10-CM | POA: Insufficient documentation

## 2011-03-10 DIAGNOSIS — M545 Low back pain, unspecified: Secondary | ICD-10-CM | POA: Insufficient documentation

## 2011-03-10 DIAGNOSIS — R5381 Other malaise: Secondary | ICD-10-CM | POA: Insufficient documentation

## 2011-03-10 DIAGNOSIS — M25559 Pain in unspecified hip: Secondary | ICD-10-CM | POA: Insufficient documentation

## 2011-03-11 ENCOUNTER — Ambulatory Visit: Payer: Medicaid Other | Admitting: Vascular Surgery

## 2011-03-19 ENCOUNTER — Ambulatory Visit: Payer: Medicaid Other | Attending: Nurse Practitioner | Admitting: Physical Therapy

## 2011-03-19 DIAGNOSIS — IMO0001 Reserved for inherently not codable concepts without codable children: Secondary | ICD-10-CM | POA: Insufficient documentation

## 2011-03-19 DIAGNOSIS — M545 Low back pain, unspecified: Secondary | ICD-10-CM | POA: Insufficient documentation

## 2011-03-19 DIAGNOSIS — R5381 Other malaise: Secondary | ICD-10-CM | POA: Insufficient documentation

## 2011-03-19 DIAGNOSIS — M25559 Pain in unspecified hip: Secondary | ICD-10-CM | POA: Insufficient documentation

## 2011-03-24 ENCOUNTER — Encounter: Payer: Medicaid Other | Admitting: Physical Therapy

## 2011-03-24 ENCOUNTER — Ambulatory Visit: Payer: Medicaid Other | Admitting: Physical Therapy

## 2011-03-26 ENCOUNTER — Encounter: Payer: Medicaid Other | Admitting: Physical Therapy

## 2011-04-02 ENCOUNTER — Ambulatory Visit: Payer: Medicaid Other | Admitting: Physical Therapy

## 2011-07-13 IMAGING — CT CT ABD-PELV W/ CM
3 of 5 series · 13 of 36 positions shown, 19 images · IV contrast (READICAT/WATER & [ID] OMNI 300)
Comparison: None.

CLINICAL DATA: Abdominal pain, previous fundoplication

CT ABDOMEN AND PELVIS WITH CONTRAST
TECHNIQUE: Multidetector CT imaging of the abdomen and pelvis was
performed following the standard protocol during bolus
administration of intravenous contrast.
Contrast: 125 ml 4mnipaque-AMM IV

[Series 3: routine abdomen · axial · 0.92mm/px · z∈[-438,-78]mm · 7 of 98 slices shown, 12 images]
[im 13/98  soft-tissue]
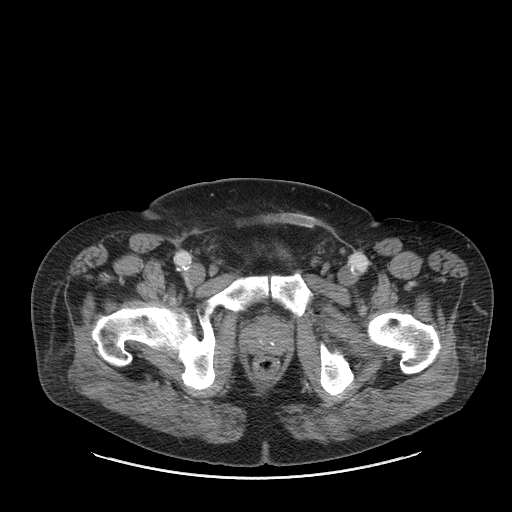
[im 13/98  bone]
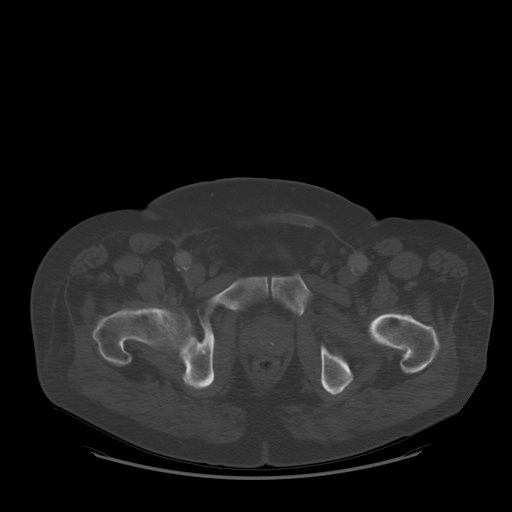
[im 25/98  soft-tissue]
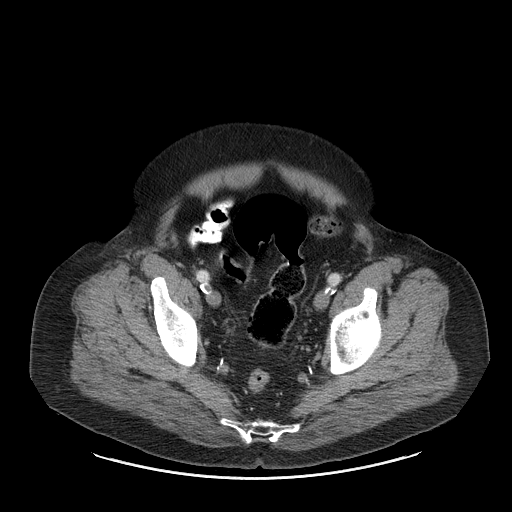
[im 37/98  soft-tissue]
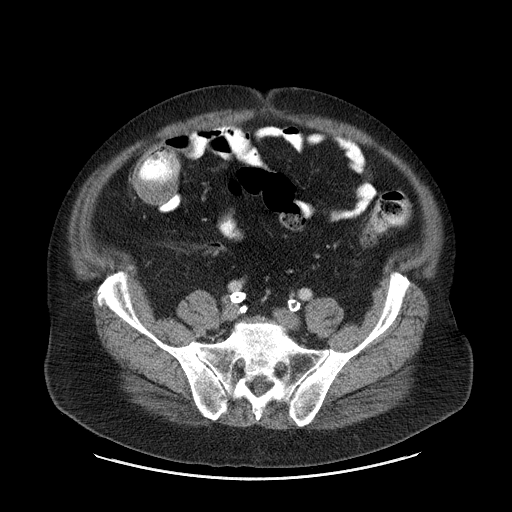
[im 49/98  soft-tissue]
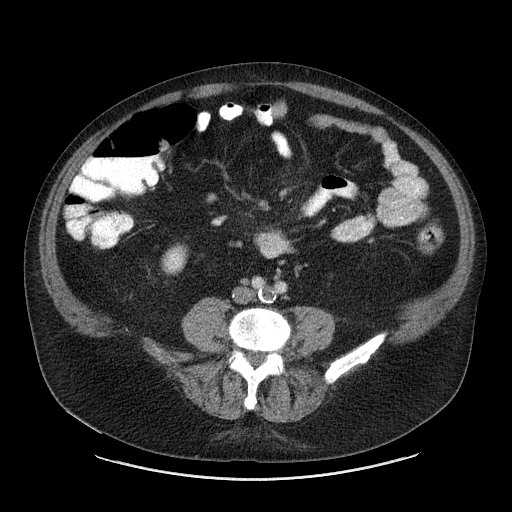
[im 49/98  lung]
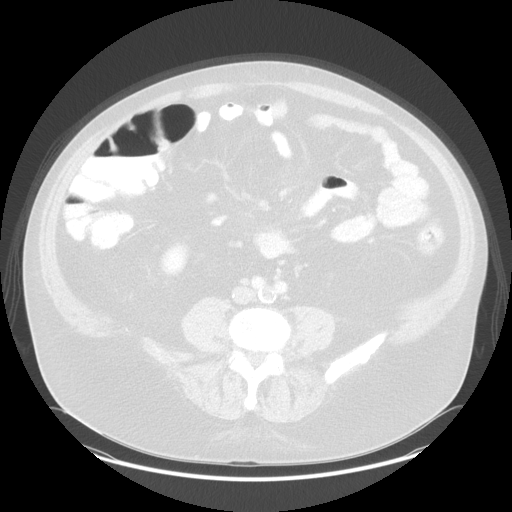
[im 61/98  soft-tissue]
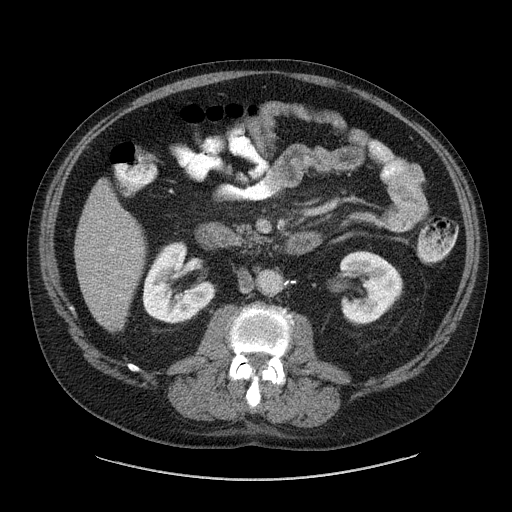
[im 61/98  lung]
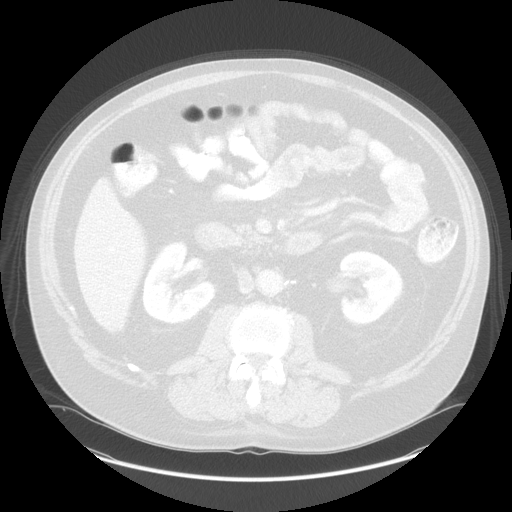
[im 73/98  soft-tissue]
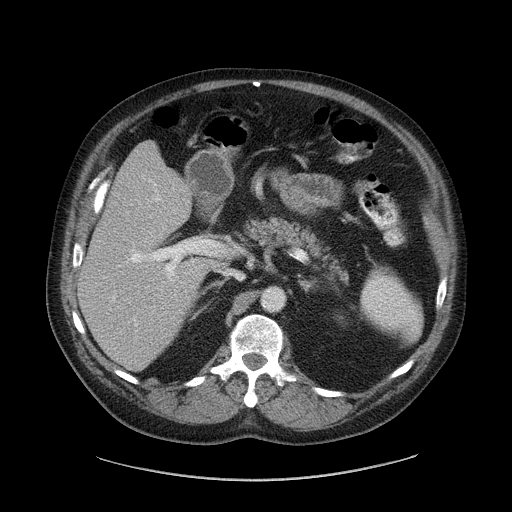
[im 73/98  lung]
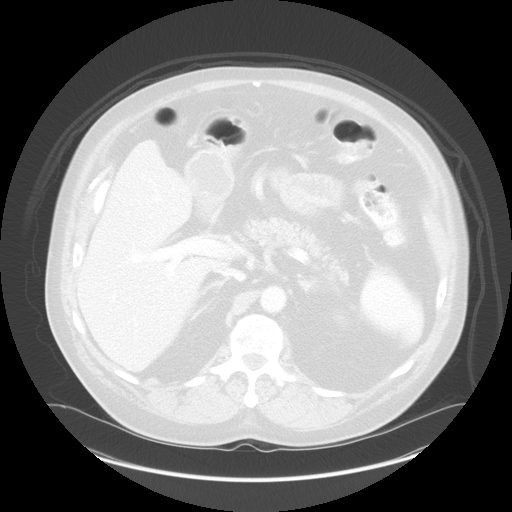
[im 85/98  soft-tissue]
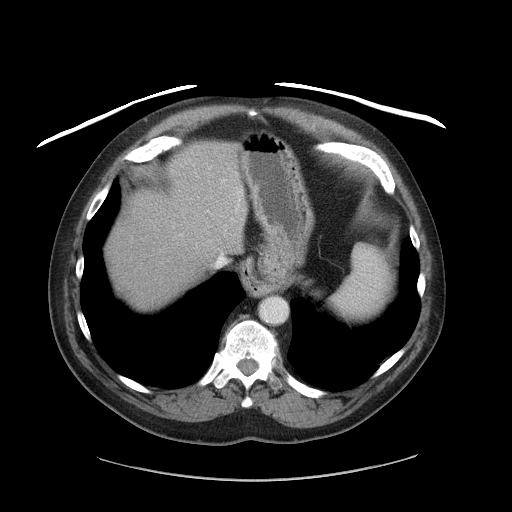
[im 85/98  lung]
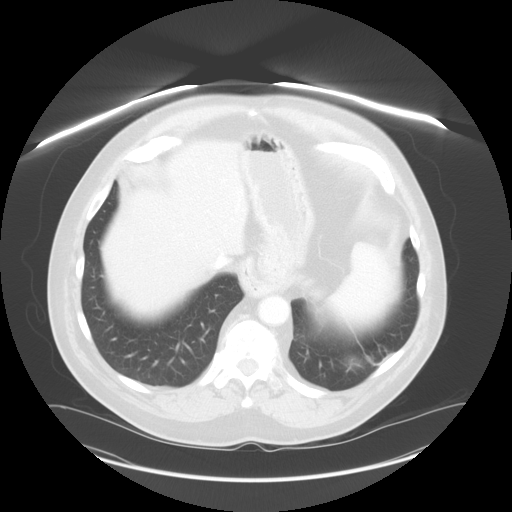

[Series 601: coronal body · coronal · 0.95mm/px · 1 of 156 slices shown, 2 images]
[im 52/156  soft-tissue]
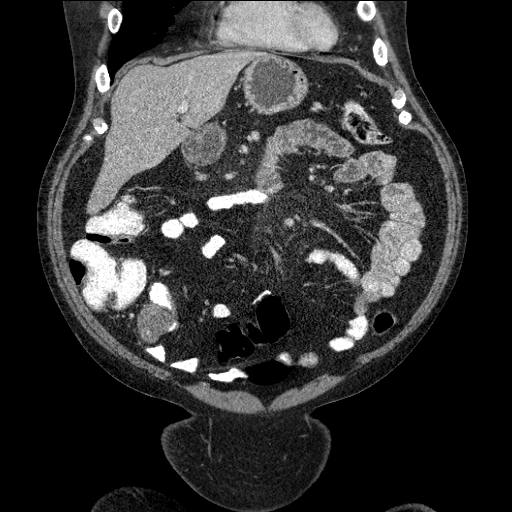
[im 52/156  bone]
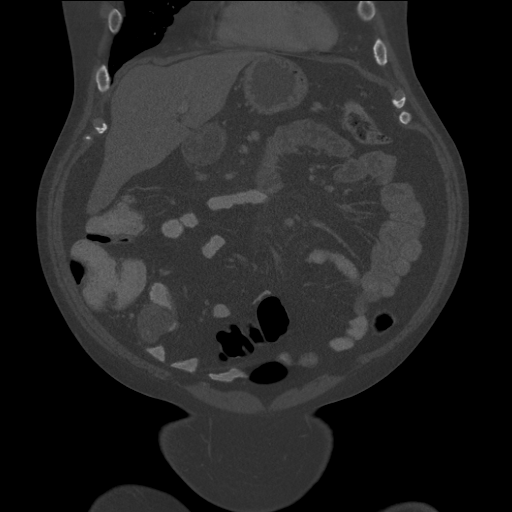

[Series 602: sagittal body · sagittal · 0.95mm/px · 5 of 177 slices shown]
[im 12/177  soft-tissue]
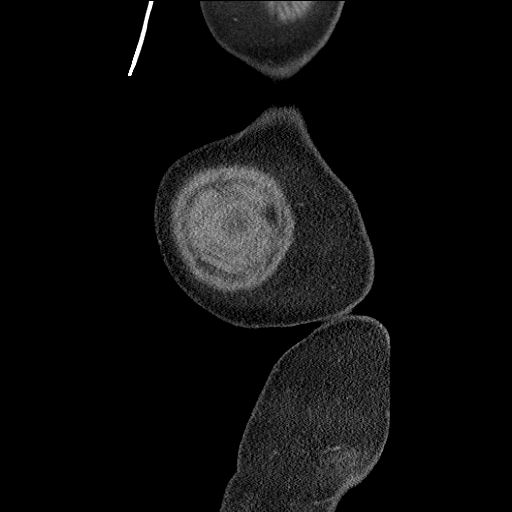
[im 34/177  soft-tissue]
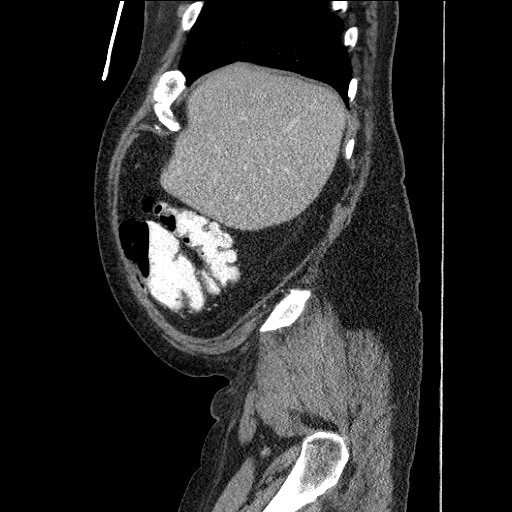
[im 56/177  soft-tissue]
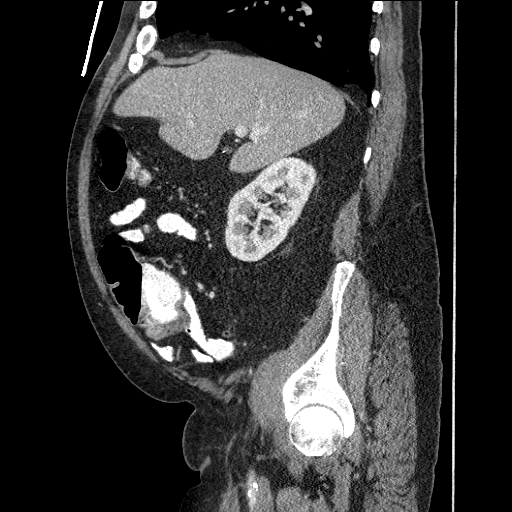
[im 78/177  soft-tissue]
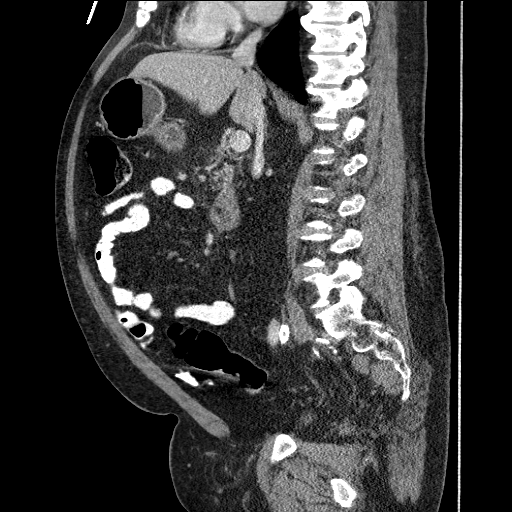
[im 100/177  soft-tissue]
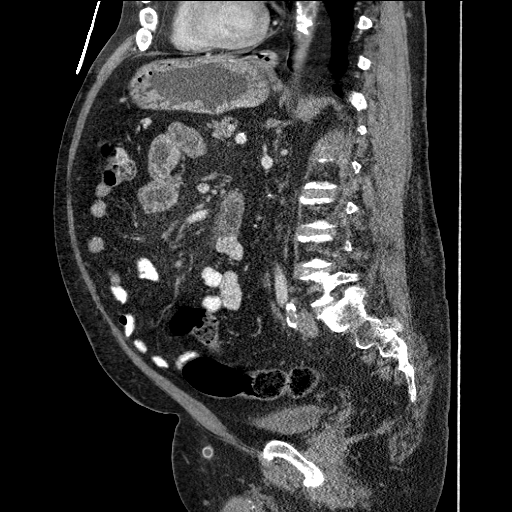

[13 of 36 positions shown; findings below may reference images not displayed]

FINDINGS: Minimal linear scarring or atelectasis posterolaterally
in the left lower lobe.

Patchy aortic calcifications with changes of aortobifem grafting.
There is a  tubular foreign body possibly synthetic bypass graft
fragment in the subcutaneous tissues anterior to the symphysis
pubis.

There is a small hiatal hernia.  Vascular clips near the GE
junction and in the gallbladder fossa.

Unremarkable liver, spleen, adrenal glands, kidneys. Delayed scans
show normal renal excretion, no hydronephrosis.  There are mild
spondylitic changes in the lower lumbar spine.

There is mild inflammation/edema anterior to the pancreas extending
into the root of the the mesentery. There is no focal mass,
adenopathy, or anatomic distortion. There are no retroperitoneal
fluid collections.

Portal vein is patent.  No mesenteric, retroperitoneal, or pelvic
adenopathy.  Small bowel, appendix, and colon are unremarkable,
nondistended.  Urinary bladder is nondilated.  There is mild
prostatic enlargement with central calcifications.  Bilateral
pelvic phleboliths.
IMPRESSION: 1.  Mild inflammatory/edematous/ infiltrative changes extending
from just anterior to the pancreas into the root the mesentery
which represent   an unusual distribution of edema from
pancreatitis versus mesenteritis, mesenteric panniculitis, or an
unusual presentation of lymphoma.

## 2012-01-04 ENCOUNTER — Other Ambulatory Visit: Payer: Self-pay | Admitting: Family Medicine

## 2012-01-04 ENCOUNTER — Ambulatory Visit
Admission: RE | Admit: 2012-01-04 | Discharge: 2012-01-04 | Disposition: A | Discharge status: Home / Self Care | Payer: Medicaid Other | Source: Ambulatory Visit | Attending: Family Medicine | Admitting: Family Medicine

## 2012-01-04 DIAGNOSIS — I10 Essential (primary) hypertension: Secondary | ICD-10-CM

## 2012-01-04 DIAGNOSIS — J449 Chronic obstructive pulmonary disease, unspecified: Secondary | ICD-10-CM

## 2012-01-12 ENCOUNTER — Ambulatory Visit
Admission: RE | Admit: 2012-01-12 | Discharge: 2012-01-12 | Disposition: A | Discharge status: Home / Self Care | Payer: Medicaid Other | Source: Ambulatory Visit | Attending: Family Medicine | Admitting: Family Medicine

## 2012-01-12 ENCOUNTER — Other Ambulatory Visit: Payer: Self-pay | Admitting: Family Medicine

## 2012-01-12 DIAGNOSIS — M545 Low back pain: Secondary | ICD-10-CM

## 2012-02-11 ENCOUNTER — Encounter: Payer: Self-pay | Admitting: Neurosurgery

## 2012-02-12 ENCOUNTER — Ambulatory Visit: Payer: Medicaid Other | Admitting: Neurosurgery

## 2012-02-22 IMAGING — CR DG CHEST 2V
2 series · 2 of 2 positions shown · non-contrast
Comparison: 04/16/2008

CLINICAL DATA: Pre catheterization.  Chest pain and shortness of
breath.

CHEST - 2 VIEW

[w chest pa]
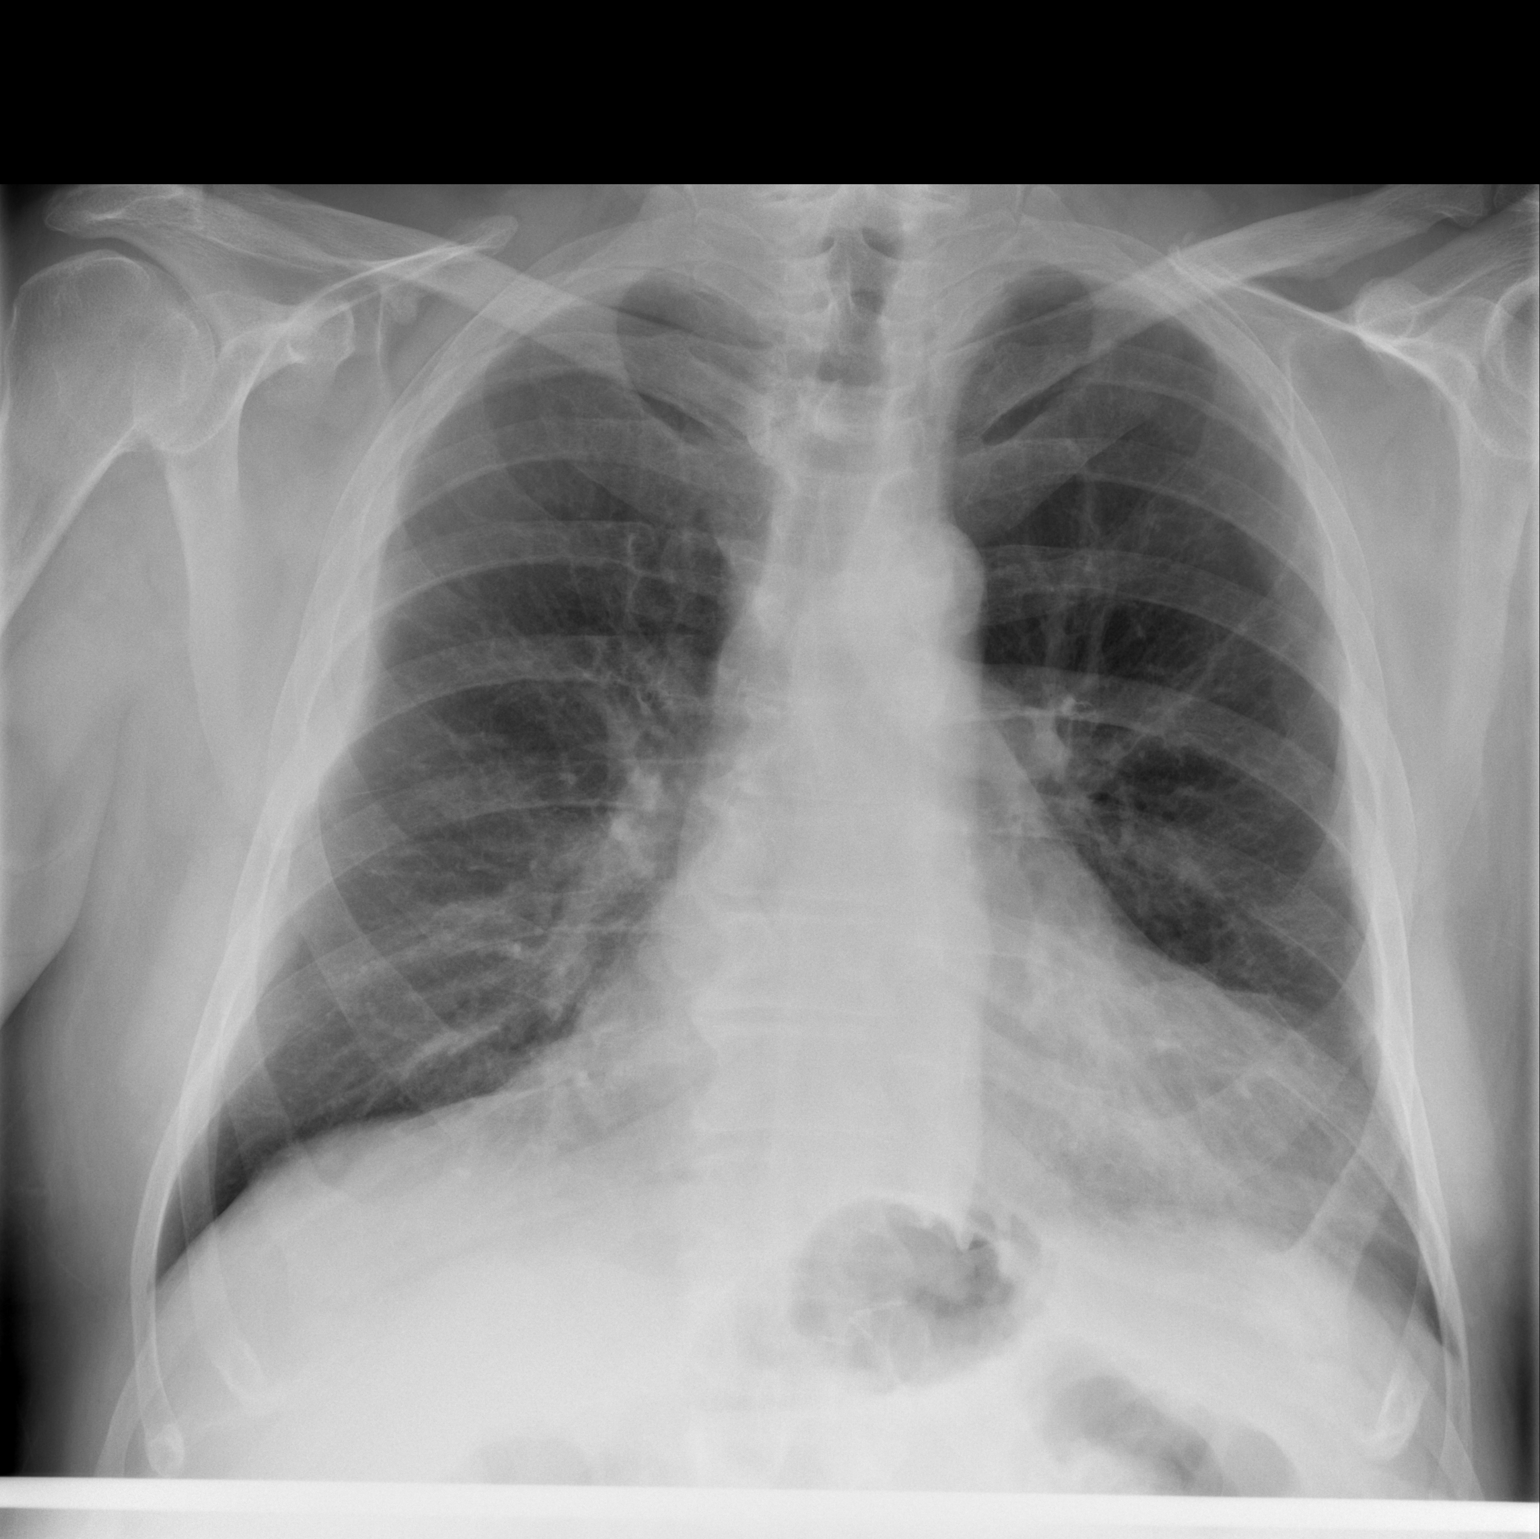

[w chest lat]
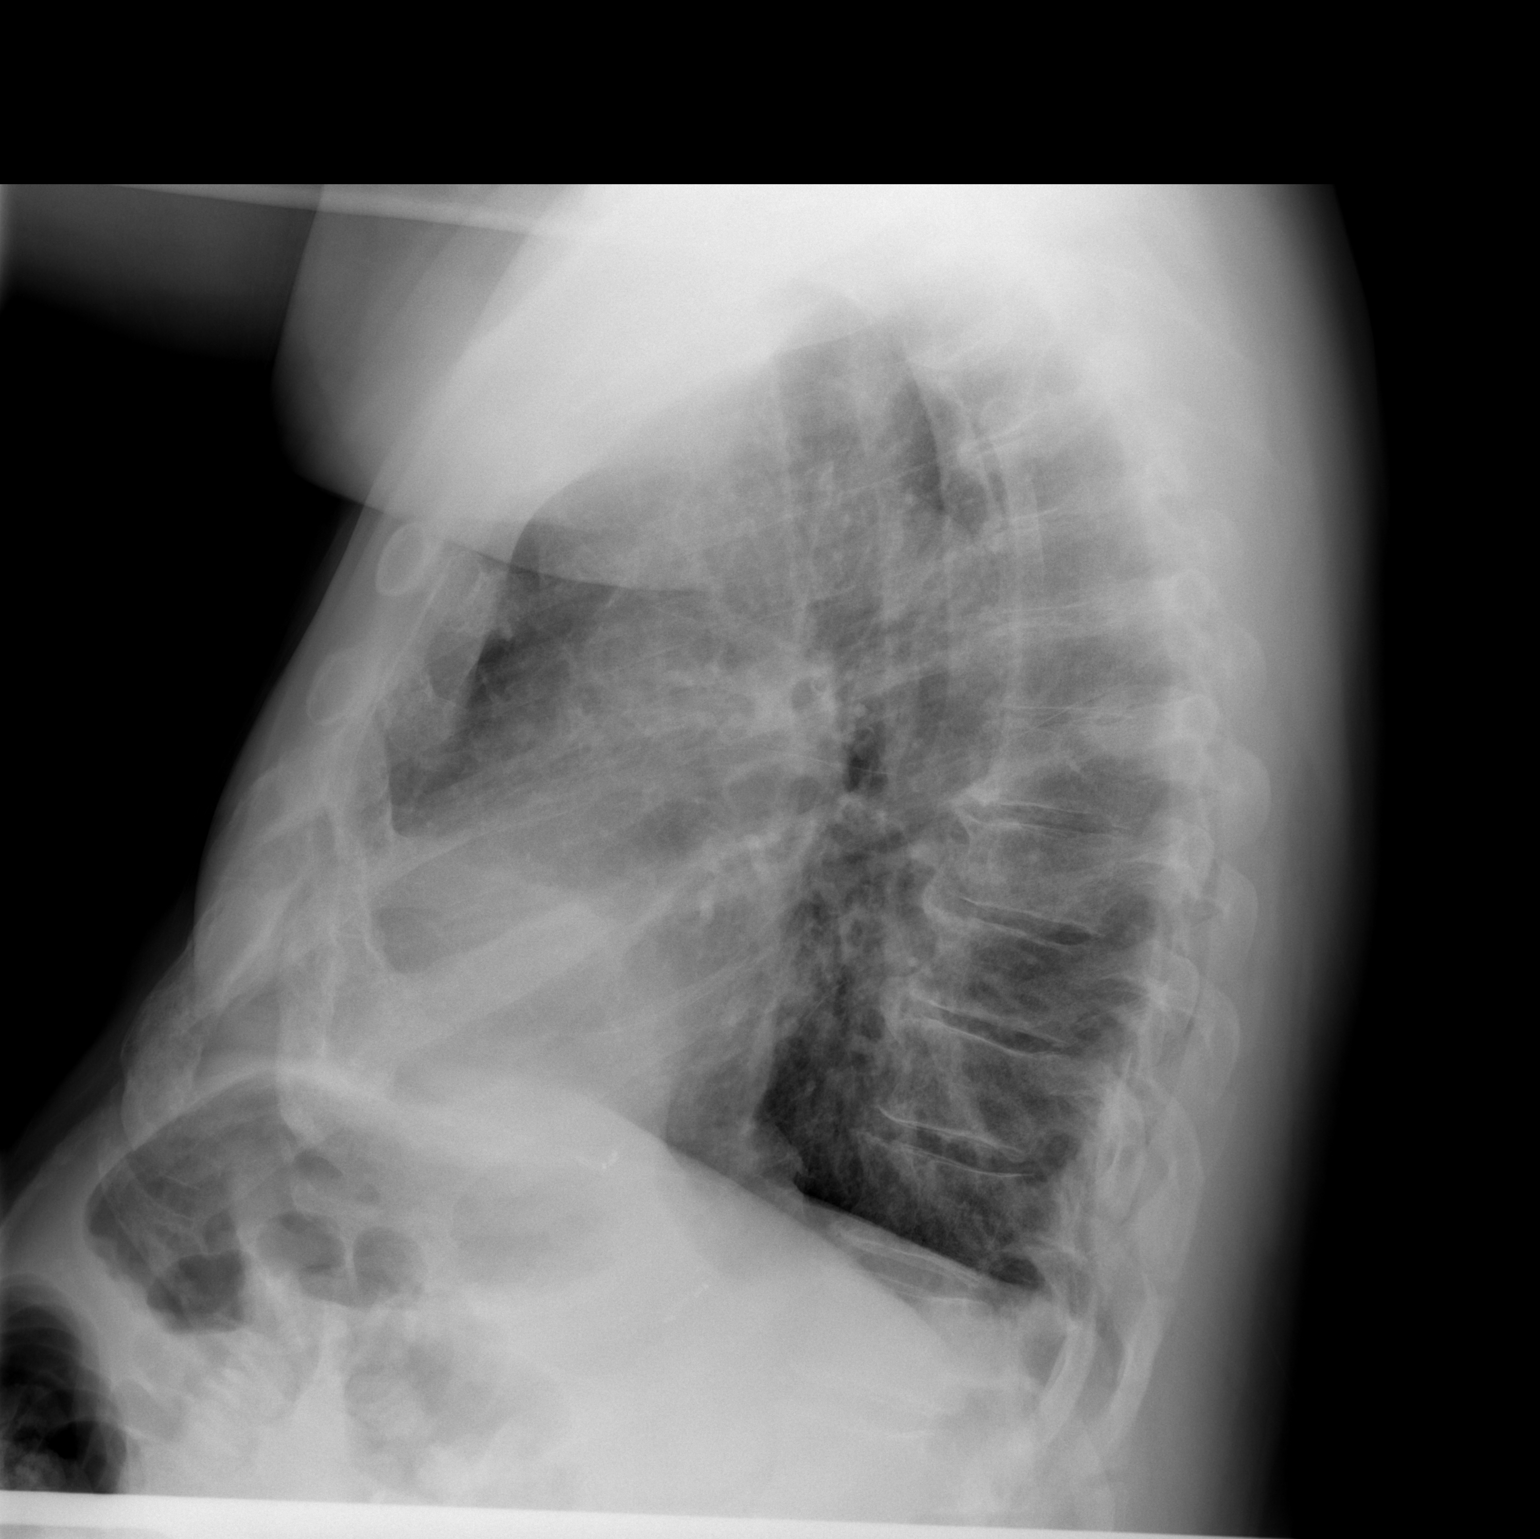

[2 of 2 positions shown; findings below may reference images not displayed]

FINDINGS: There is a slight impression on both walls of the upper
trachea, which is unchanged from 11/13/2006.  Heart is enlarged,
stable.  There may be mild scarring at the lung bases.  Lungs are
otherwise clear.  No pleural fluid.
IMPRESSION: No acute findings.

## 2012-02-25 ENCOUNTER — Ambulatory Visit: Payer: Medicaid Other | Admitting: Neurosurgery

## 2012-03-14 ENCOUNTER — Other Ambulatory Visit: Payer: Self-pay

## 2012-03-14 ENCOUNTER — Emergency Department (HOSPITAL_COMMUNITY)
Admission: EM | Admit: 2012-03-14 | Discharge: 2012-03-14 | Disposition: A | Discharge status: Home / Self Care | Payer: Medicaid Other | Attending: Emergency Medicine | Admitting: Emergency Medicine

## 2012-03-14 ENCOUNTER — Encounter (HOSPITAL_COMMUNITY): Payer: Self-pay | Admitting: *Deleted

## 2012-03-14 ENCOUNTER — Emergency Department (HOSPITAL_COMMUNITY): Payer: Medicaid Other

## 2012-03-14 DIAGNOSIS — I10 Essential (primary) hypertension: Secondary | ICD-10-CM | POA: Insufficient documentation

## 2012-03-14 DIAGNOSIS — J4489 Other specified chronic obstructive pulmonary disease: Secondary | ICD-10-CM | POA: Insufficient documentation

## 2012-03-14 DIAGNOSIS — J45909 Unspecified asthma, uncomplicated: Secondary | ICD-10-CM | POA: Insufficient documentation

## 2012-03-14 DIAGNOSIS — F3289 Other specified depressive episodes: Secondary | ICD-10-CM | POA: Insufficient documentation

## 2012-03-14 DIAGNOSIS — I252 Old myocardial infarction: Secondary | ICD-10-CM | POA: Insufficient documentation

## 2012-03-14 DIAGNOSIS — I2510 Atherosclerotic heart disease of native coronary artery without angina pectoris: Secondary | ICD-10-CM | POA: Insufficient documentation

## 2012-03-14 DIAGNOSIS — K219 Gastro-esophageal reflux disease without esophagitis: Secondary | ICD-10-CM | POA: Insufficient documentation

## 2012-03-14 DIAGNOSIS — R1012 Left upper quadrant pain: Secondary | ICD-10-CM | POA: Insufficient documentation

## 2012-03-14 DIAGNOSIS — R109 Unspecified abdominal pain: Secondary | ICD-10-CM

## 2012-03-14 DIAGNOSIS — Z8679 Personal history of other diseases of the circulatory system: Secondary | ICD-10-CM | POA: Insufficient documentation

## 2012-03-14 DIAGNOSIS — Z86718 Personal history of other venous thrombosis and embolism: Secondary | ICD-10-CM | POA: Insufficient documentation

## 2012-03-14 DIAGNOSIS — Z7902 Long term (current) use of antithrombotics/antiplatelets: Secondary | ICD-10-CM | POA: Insufficient documentation

## 2012-03-14 DIAGNOSIS — F329 Major depressive disorder, single episode, unspecified: Secondary | ICD-10-CM | POA: Insufficient documentation

## 2012-03-14 DIAGNOSIS — F17200 Nicotine dependence, unspecified, uncomplicated: Secondary | ICD-10-CM | POA: Insufficient documentation

## 2012-03-14 DIAGNOSIS — Z79899 Other long term (current) drug therapy: Secondary | ICD-10-CM | POA: Insufficient documentation

## 2012-03-14 DIAGNOSIS — I509 Heart failure, unspecified: Secondary | ICD-10-CM | POA: Insufficient documentation

## 2012-03-14 DIAGNOSIS — J449 Chronic obstructive pulmonary disease, unspecified: Secondary | ICD-10-CM | POA: Insufficient documentation

## 2012-03-14 DIAGNOSIS — E785 Hyperlipidemia, unspecified: Secondary | ICD-10-CM | POA: Insufficient documentation

## 2012-03-14 LAB — COMPREHENSIVE METABOLIC PANEL
Alkaline Phosphatase: 75 U/L (ref 39–117)
BUN: 10 mg/dL (ref 6–23)
CO2: 22 mEq/L (ref 19–32)
Calcium: 9.4 mg/dL (ref 8.4–10.5)
GFR calc Af Amer: >90 mL/min (ref >90)
GFR calc non Af Amer: >90 mL/min (ref >90)
Glucose, Bld: 106 mg/dL — ABNORMAL HIGH (ref 70–99)
Total Protein: 7 g/dL (ref 6.0–8.3)

## 2012-03-14 LAB — CBC
HCT: 46.5 % (ref 39.0–52.0)
Hemoglobin: 16.1 g/dL (ref 13.0–17.0)
MCH: 30.9 pg (ref 26.0–34.0)
MCHC: 34.6 g/dL (ref 30.0–36.0)
RBC: 5.21 MIL/uL (ref 4.22–5.81)

## 2012-03-14 LAB — TROPONIN I: Troponin I: <0.3 ng/mL (ref <0.30)

## 2012-03-14 MED ORDER — OXYCODONE HCL 5 MG PO TABS: 5.0000 mg | ORAL_TABLET | ORAL | Status: DC | PRN

## 2012-03-14 MED ORDER — IOHEXOL 300 MG/ML  SOLN
100.0000 mL | Freq: Once | INTRAMUSCULAR | Status: AC | PRN
  Administered 2012-03-14: 100 mL via INTRAVENOUS

## 2012-03-14 MED ORDER — IOHEXOL 300 MG/ML  SOLN
25.0000 mL | INTRAMUSCULAR | Status: AC
  Administered 2012-03-14 (×2): 25 mL via ORAL

## 2012-03-14 MED ORDER — HYDROMORPHONE HCL PF 1 MG/ML IJ SOLN
1.0000 mg | Freq: Once | INTRAMUSCULAR | Status: AC
  Administered 2012-03-14: 1 mg via INTRAVENOUS
  Filled 2012-03-14: qty 1

## 2012-03-14 NOTE — ED Notes (Signed)
Patient transported to CT 

## 2012-03-14 NOTE — ED Provider Notes (Signed)
History     CSN: [id number]  Arrival date & time 03/14/12  1136   First MD Initiated Contact with Patient 03/14/12 1149      Chief Complaint  Patient presents with  . Chest Pain     HPI The patient reports 2 weeks of constant left-sided chest and left upper quadrant abdominal pain.  Today she reported that the pain seemed to be worse.  His pain is been constant.  He has no prior history of pulmonary embolism but does have a history of DVT.[name]  He has had an MI and congestive heart failure before.  He's had some constant shortness of breath but that does not seem to be changing over the past several weeks.  He has not called his primary care team about this.  He's been using his inhalers with some relief.  His symptoms are mild to moderate in severity.  There is no pleuritic component to his pain.  His had some loose stools.  He denies melena and hematochezia.  No nausea or vomiting.  He's been treating his pain with drinking alcohol.  He states he uses as an drink alcohol but is the only thing he's used to be able to treat his pain thus far.    Past Medical History  Diagnosis Date  . Depression   . Hypertension   . Hyperlipidemia   . Myocardial infarction 2000  . CHF (congestive heart failure)   . COPD (chronic obstructive pulmonary disease)   . Peripheral vascular disease, unspecified   . GERD (gastroesophageal reflux disease)   . Asthma   . Anxiety   . History of DVT of lower extremity   . CAD (coronary artery disease)     Past Surgical History  Procedure Date  . Femoral bypass 08/19/10    Right Fem-Pop  . Aortogram w/ ptca 09/292003    No family history on file.  History  Substance Use Topics  . Smoking status: Current Every Day Smoker -- 1.5 packs/day  . Smokeless tobacco: Not on file  . Alcohol Use: No      Review of Systems  All other systems reviewed and are negative.    Allergies  Darvocet and Haldol  Home Medications   Current Outpatient Rx  Name   Route  Sig  Dispense  Refill  . CILOSTAZOL 100 MG PO TABS   Oral   Take 100 mg by mouth 2 (two) times daily.           [name] CLOPIDOGREL BISULFATE 75 MG PO TABS   Oral   Take 75 mg by mouth daily.           . DULOXETINE HCL 60 MG PO CPEP   Oral   Take 60 mg by mouth daily.           . FUROSEMIDE 40 MG PO TABS   Oral   Take 80 mg by mouth daily.           [name] LOVASTATIN 40 MG PO TABS   Oral   Take 40 mg by mouth 2 (two) times daily.           [name] METHOCARBAMOL 750 MG PO TABS   Oral   Take 750 mg by mouth as needed. For pain         . METOPROLOL TARTRATE 25 MG PO TABS   Oral   Take 25 mg by mouth 2 (two) times daily.           [name]  OMEPRAZOLE 20 MG PO CPDR   Oral   Take 20 mg by mouth daily.           [name] POTASSIUM CHLORIDE CRYS ER 20 MEQ PO TBCR   Oral   Take 20 mEq by mouth daily.           . TRAZODONE HCL 150 MG PO TABS   Oral   Take 150-300 mg by mouth at bedtime as needed. For sleep            BP 161/91  Pulse 88  Temp 98.4 F (36.9 C) (Oral)  Resp 14  Ht 6' (1.829 m)  Wt 240 lb (108.863 kg)  BMI 32.55 kg/m2  SpO2 97%  Physical Exam  Nursing note and vitals reviewed. Constitutional: He is oriented to person, place, and time. He appears well-developed and well-nourished.  HENT:  Head: Normocephalic and atraumatic.  Eyes: EOM are normal.  Neck: Normal range of motion.  Cardiovascular: Normal rate, regular rhythm, normal heart sounds and intact distal pulses.   Pulmonary/Chest: Effort normal and breath sounds normal. No respiratory distress. He exhibits no tenderness.  Abdominal: Soft. He exhibits no distension. There is tenderness.       Tenderness in the left side of the patient's abdomen without guarding or rebound.  When I push just under his 12th rib on the left side he states this is where his pain is the most severe.  Musculoskeletal: Normal range of motion. He exhibits no edema and no tenderness.  Neurological: He is alert and oriented  to person, place, and time.  Skin: Skin is warm and dry.  Psychiatric: He has a normal mood and affect. Judgment normal.    ED Course  Procedures (including critical care time)   Date: 03/14/2012  Rate: 63  Rhythm: normal sinus rhythm  QRS Axis: normal  Intervals: normal  ST/T Wave abnormalities: normal  Conduction Disutrbances: none  Narrative Interpretation:   Old EKG Reviewed: No significant changes noted     Labs Reviewed  COMPREHENSIVE METABOLIC PANEL - Abnormal; Notable for the following:    Sodium 148 (*)     Glucose, Bld 106 (*)     All other components within normal limits  CBC  TROPONIN I  LIPASE, BLOOD   Dg Chest 2 View  03/14/2012  *RADIOLOGY REPORT*  Clinical Data: Chest pain.  CHEST - 2 VIEW  Comparison: January 04, 2012.  Findings: Stable cardiomegaly.  Bony thorax is intact.  No acute pulmonary disease is noted.  IMPRESSION: No acute cardiopulmonary abnormality seen.   Original Report Authenticated By: Lupita Raider.,  M.D.    Ct Abdomen Pelvis W Contrast  03/14/2012  *RADIOLOGY REPORT*  Clinical Data: Left upper quadrant abdominal pain.  CT ABDOMEN AND PELVIS WITH CONTRAST  Technique:  Multidetector CT imaging of the abdomen and pelvis was performed following the standard protocol during bolus administration of intravenous contrast.  Contrast: OMNIPAQUE IOHEXOL 300 MG/ML  SOLN  Comparison: CT 10/09/2009  Findings: Motion artifact at the lung bases.  No evidence for free intraperitoneal air.  Surgical clips at the GE junction are consistent with previous fundoplication.  No gross abnormality to the liver and the gallbladder has been removed.  Portal venous system is patent. Again noted is mild stranding in the central small bowel mesentery which is unchanged.  No gross abnormality to the pancreas, spleen or adrenal glands.  There is mild fullness and nodularity in the left adrenal gland which is unchanged.  No hydronephrosis. Stable scarring along the left  kidney lower pole.  The patient has undergone an aortobifemoral bypass graft.  The bypass limbs appear to be patent. Limited evaluation of the femoral/femoral bypass graft.  Degenerative disc changes at L4-L5 and L5-S1.  There is a lymph node between the aorta and inferior vena cava on sequence two, image 34 which measures 1.3 x 1.1 cm and previously measured 1.2 x 1.1 cm.  Overall, there is no significant abdominal or pelvic lymphadenopathy.  Few calcifications within the prostate gland.  There is fluid in the urinary bladder.  No acute bony abnormality.  There may be old left rib fractures.  IMPRESSION: No acute abnormalities within the abdomen or pelvis.  Postsurgical changes consistent with an aortobifemoral bypass graft and a fundoplication.   Original Report Authenticated By: Richarda Overlie, M.D.    I personally reviewed the imaging tests through PACS system I reviewed available ER/hospitalization records through the EMR   1. Abdominal pain       MDM  4:16 PM The patient's pain is significantly improved.  His vital signs are normal.  CT scan demonstrates no acute pathology.  Chest x-ray is clear.  My suspicion for pulmonary embolism is very low.  I don't appreciate any unilateral leg swelling.  Discharge home with a prescription for pain medicine and PCP followup.        Lyanne Co, MD 03/14/12 [phone]

## 2012-03-14 NOTE — ED Notes (Signed)
Patient transported to X-ray 

## 2012-03-14 NOTE — ED Notes (Signed)
C/o LUQ pain x 2 weeks, worse with mvmt, deep breaths, palpation. Abd distended but states size is his norm. Denies n/v/d. Reports SOB no worse than normal with his COPD but having to take shallow breaths due to pain. Also admits to recently drinking 4-5 shots liquor for the pain but is having to drink more frequently when it wears off. States was an alcoholic but quit 2010, has been told he hAS CIRRHOSIS.

## 2012-03-14 NOTE — ED Notes (Signed)
Pt c/o chest pain and sob x 2 weeks.  Today the pain became unbearable and he feels like it's spreading .  Hx of MI 10 years prior when he OD'd while "celebrating".  RLE edema, though pt states that is chronic.

## 2012-04-04 ENCOUNTER — Emergency Department (HOSPITAL_COMMUNITY): Payer: Medicaid Other

## 2012-04-04 ENCOUNTER — Other Ambulatory Visit: Payer: Self-pay

## 2012-04-04 ENCOUNTER — Encounter (HOSPITAL_COMMUNITY): Payer: Self-pay | Admitting: Emergency Medicine

## 2012-04-04 ENCOUNTER — Inpatient Hospital Stay (HOSPITAL_COMMUNITY)
Admission: EM | Admit: 2012-04-04 | Discharge: 2012-04-06 | DRG: 193 | Disposition: A | Discharge status: Home / Self Care | Payer: Medicaid Other | Attending: Internal Medicine | Admitting: Internal Medicine

## 2012-04-04 DIAGNOSIS — I739 Peripheral vascular disease, unspecified: Secondary | ICD-10-CM | POA: Diagnosis present

## 2012-04-04 DIAGNOSIS — J441 Chronic obstructive pulmonary disease with (acute) exacerbation: Secondary | ICD-10-CM | POA: Diagnosis present

## 2012-04-04 DIAGNOSIS — I509 Heart failure, unspecified: Secondary | ICD-10-CM | POA: Diagnosis present

## 2012-04-04 DIAGNOSIS — I5032 Chronic diastolic (congestive) heart failure: Secondary | ICD-10-CM | POA: Diagnosis present

## 2012-04-04 DIAGNOSIS — K219 Gastro-esophageal reflux disease without esophagitis: Secondary | ICD-10-CM | POA: Diagnosis present

## 2012-04-04 DIAGNOSIS — E785 Hyperlipidemia, unspecified: Secondary | ICD-10-CM | POA: Diagnosis present

## 2012-04-04 DIAGNOSIS — J189 Pneumonia, unspecified organism: Secondary | ICD-10-CM

## 2012-04-04 DIAGNOSIS — Z86718 Personal history of other venous thrombosis and embolism: Secondary | ICD-10-CM

## 2012-04-04 DIAGNOSIS — T82898A Other specified complication of vascular prosthetic devices, implants and grafts, initial encounter: Secondary | ICD-10-CM

## 2012-04-04 DIAGNOSIS — I2510 Atherosclerotic heart disease of native coronary artery without angina pectoris: Secondary | ICD-10-CM | POA: Diagnosis present

## 2012-04-04 DIAGNOSIS — F17200 Nicotine dependence, unspecified, uncomplicated: Secondary | ICD-10-CM | POA: Diagnosis present

## 2012-04-04 DIAGNOSIS — F3289 Other specified depressive episodes: Secondary | ICD-10-CM | POA: Diagnosis present

## 2012-04-04 DIAGNOSIS — Z79899 Other long term (current) drug therapy: Secondary | ICD-10-CM

## 2012-04-04 DIAGNOSIS — M7989 Other specified soft tissue disorders: Secondary | ICD-10-CM

## 2012-04-04 DIAGNOSIS — I10 Essential (primary) hypertension: Secondary | ICD-10-CM

## 2012-04-04 DIAGNOSIS — J449 Chronic obstructive pulmonary disease, unspecified: Secondary | ICD-10-CM | POA: Diagnosis present

## 2012-04-04 DIAGNOSIS — J9600 Acute respiratory failure, unspecified whether with hypoxia or hypercapnia: Secondary | ICD-10-CM | POA: Diagnosis present

## 2012-04-04 DIAGNOSIS — I252 Old myocardial infarction: Secondary | ICD-10-CM

## 2012-04-04 HISTORY — DX: Shortness of breath: R06.02

## 2012-04-04 HISTORY — DX: Unspecified osteoarthritis, unspecified site: M19.90

## 2012-04-04 HISTORY — DX: Pneumonia, unspecified organism: J18.9

## 2012-04-04 LAB — CBC
HCT: 42.4 % (ref 39.0–52.0)
MCH: 30.9 pg (ref 26.0–34.0)
MCH: 30.7 pg (ref 26.0–34.0)
MCHC: 33.7 g/dL (ref 30.0–36.0)
MCV: 90.6 fL (ref 78.0–100.0)
MCV: 91 fL (ref 78.0–100.0)
Platelets: 136 10*3/uL — ABNORMAL LOW (ref 150–400)
Platelets: 160 10*3/uL (ref 150–400)
RBC: 4.7 MIL/uL (ref 4.22–5.81)
RDW: 13.7 % (ref 11.5–15.5)
RDW: 13.9 % (ref 11.5–15.5)
WBC: 9.2 10*3/uL (ref 4.0–10.5)

## 2012-04-04 LAB — BASIC METABOLIC PANEL
CO2: 26 mEq/L (ref 19–32)
Calcium: 10 mg/dL (ref 8.4–10.5)
Creatinine, Ser: 0.84 mg/dL (ref 0.50–1.35)
GFR calc Af Amer: >90 mL/min (ref >90)
Sodium: 142 mEq/L (ref 135–145)

## 2012-04-04 LAB — POCT I-STAT TROPONIN I: Troponin i, poc: 0 ng/mL (ref 0.00–0.08)

## 2012-04-04 LAB — CREATININE, SERUM: GFR calc Af Amer: >90 mL/min (ref >90)

## 2012-04-04 MED ORDER — ACETAMINOPHEN 650 MG RE SUPP: 650.0000 mg | Freq: Four times a day (QID) | RECTAL | Status: DC | PRN

## 2012-04-04 MED ORDER — SODIUM CHLORIDE 0.9 % IJ SOLN: 3.0000 mL | Freq: Two times a day (BID) | INTRAMUSCULAR | Status: DC

## 2012-04-04 MED ORDER — AZITHROMYCIN 250 MG PO TABS
500.0000 mg | ORAL_TABLET | Freq: Once | ORAL | Status: AC
  Administered 2012-04-04: 500 mg via ORAL
  Filled 2012-04-04: qty 2

## 2012-04-04 MED ORDER — NITROGLYCERIN 0.4 MG SL SUBL: 0.4000 mg | SUBLINGUAL_TABLET | SUBLINGUAL | Status: DC | PRN

## 2012-04-04 MED ORDER — IOHEXOL 350 MG/ML SOLN
100.0000 mL | Freq: Once | INTRAVENOUS | Status: AC | PRN
  Administered 2012-04-04: 100 mL via INTRAVENOUS

## 2012-04-04 MED ORDER — SIMVASTATIN 20 MG PO TABS
20.0000 mg | ORAL_TABLET | Freq: Every day | ORAL | Status: DC
  Administered 2012-04-05: 20 mg via ORAL
  Filled 2012-04-04 (×2): qty 1

## 2012-04-04 MED ORDER — PANTOPRAZOLE SODIUM 40 MG PO TBEC
40.0000 mg | DELAYED_RELEASE_TABLET | Freq: Every day | ORAL | Status: DC
  Administered 2012-04-05 – 2012-04-06 (×2): 40 mg via ORAL
  Filled 2012-04-04 (×2): qty 1

## 2012-04-04 MED ORDER — ASPIRIN 325 MG PO TABS: 325.0000 mg | ORAL_TABLET | ORAL | Status: AC

## 2012-04-04 MED ORDER — ONDANSETRON HCL 4 MG/2ML IJ SOLN
4.0000 mg | INTRAMUSCULAR | Status: AC
  Administered 2012-04-04: 4 mg via INTRAVENOUS
  Filled 2012-04-04: qty 2

## 2012-04-04 MED ORDER — ACETAMINOPHEN 325 MG PO TABS
650.0000 mg | ORAL_TABLET | Freq: Four times a day (QID) | ORAL | Status: DC | PRN
  Administered 2012-04-05: 650 mg via ORAL
  Filled 2012-04-04: qty 2

## 2012-04-04 MED ORDER — SODIUM CHLORIDE 0.9 % IJ SOLN: 3.0000 mL | INTRAMUSCULAR | Status: DC | PRN

## 2012-04-04 MED ORDER — POLYETHYLENE GLYCOL 3350 17 G PO PACK
17.0000 g | PACK | Freq: Every day | ORAL | Status: DC | PRN
  Filled 2012-04-04: qty 1

## 2012-04-04 MED ORDER — ONDANSETRON HCL 4 MG/2ML IJ SOLN: 4.0000 mg | Freq: Four times a day (QID) | INTRAMUSCULAR | Status: DC | PRN

## 2012-04-04 MED ORDER — AMLODIPINE BESYLATE 10 MG PO TABS
10.0000 mg | ORAL_TABLET | Freq: Every day | ORAL | Status: DC
  Administered 2012-04-05 – 2012-04-06 (×2): 10 mg via ORAL
  Filled 2012-04-04 (×2): qty 1

## 2012-04-04 MED ORDER — HYDROCODONE-ACETAMINOPHEN 5-325 MG PO TABS: 1.0000 | ORAL_TABLET | ORAL | Status: DC | PRN

## 2012-04-04 MED ORDER — HYDROCODONE-ACETAMINOPHEN 7.5-325 MG PO TABS
1.0000 | ORAL_TABLET | Freq: Three times a day (TID) | ORAL | Status: DC
  Administered 2012-04-04 – 2012-04-06 (×6): 1 via ORAL
  Filled 2012-04-04 (×6): qty 1

## 2012-04-04 MED ORDER — HEPARIN SODIUM (PORCINE) 5000 UNIT/ML IJ SOLN
5000.0000 [IU] | Freq: Three times a day (TID) | INTRAMUSCULAR | Status: DC
  Administered 2012-04-04 – 2012-04-06 (×6): 5000 [IU] via SUBCUTANEOUS
  Filled 2012-04-04 (×10): qty 1

## 2012-04-04 MED ORDER — ONDANSETRON HCL 4 MG PO TABS: 4.0000 mg | ORAL_TABLET | Freq: Four times a day (QID) | ORAL | Status: DC | PRN

## 2012-04-04 MED ORDER — DEXTROSE 5 % IV SOLN
1.0000 g | Freq: Once | INTRAVENOUS | Status: AC
  Administered 2012-04-04: 1 g via INTRAVENOUS
  Filled 2012-04-04: qty 10

## 2012-04-04 MED ORDER — METHYLPREDNISOLONE SODIUM SUCC 125 MG IJ SOLR
80.0000 mg | Freq: Two times a day (BID) | INTRAMUSCULAR | Status: DC
Start: 2012-04-04 — End: 2012-04-05
  Administered 2012-04-04 – 2012-04-05 (×2): 80 mg via INTRAVENOUS
  Filled 2012-04-04 (×4): qty 1.28

## 2012-04-04 MED ORDER — SODIUM CHLORIDE 0.9 % IJ SOLN
3.0000 mL | Freq: Two times a day (BID) | INTRAMUSCULAR | Status: DC
  Administered 2012-04-04: 3 mL via INTRAVENOUS

## 2012-04-04 MED ORDER — MORPHINE SULFATE 4 MG/ML IJ SOLN
4.0000 mg | Freq: Once | INTRAMUSCULAR | Status: AC
  Administered 2012-04-04: 4 mg via INTRAVENOUS
  Filled 2012-04-04: qty 1

## 2012-04-04 MED ORDER — DEXTROSE 5 % IV SOLN
1.0000 g | INTRAVENOUS | Status: DC
  Administered 2012-04-05: 1 g via INTRAVENOUS
  Filled 2012-04-04 (×2): qty 10

## 2012-04-04 MED ORDER — COLCHICINE 0.6 MG PO TABS
0.6000 mg | ORAL_TABLET | Freq: Two times a day (BID) | ORAL | Status: DC
  Administered 2012-04-04 – 2012-04-06 (×4): 0.6 mg via ORAL
  Filled 2012-04-04 (×5): qty 1

## 2012-04-04 MED ORDER — TIOTROPIUM BROMIDE MONOHYDRATE 18 MCG IN CAPS
18.0000 ug | ORAL_CAPSULE | Freq: Every day | RESPIRATORY_TRACT | Status: DC
  Administered 2012-04-05: 18 ug via RESPIRATORY_TRACT
  Filled 2012-04-04: qty 5

## 2012-04-04 MED ORDER — SODIUM CHLORIDE 0.9 % IV SOLN: 250.0000 mL | INTRAVENOUS | Status: DC | PRN

## 2012-04-04 MED ORDER — AZITHROMYCIN 500 MG PO TABS
500.0000 mg | ORAL_TABLET | ORAL | Status: DC
  Administered 2012-04-05: 500 mg via ORAL
  Filled 2012-04-04 (×2): qty 1

## 2012-04-04 NOTE — H&P (Signed)
[facility] History and Physical  ARNOLD KESTER ZOX:[id number] DOB: May 10, 1950 DOA: 04/04/2012  Referring physician: Dr. Judd Lien PCP: Elpidio Anis, PA  Specialists: none  Chief Complaint: hemoptysis  HPI: [name] is a 62 y.o. male  Past medical history of severe peripheral vascular disease,, hypertension, diastolic heart failure with an EF of 65%, ongoing tobacco abuser comes in for his hemoptysis 2 days prior to admission progressively getting worse. The patient was previously seen 03/15/2011 for some abdominal pain in the ED. Workup produced no significant finding so he was discharged home. He continues to complain of shortness of breath, also came to the emergency room. Here CT of the chest was done that shows a right upper lobe pneumonia, no PE so we were consulted for further evaluation. He was given Rocephin and azithromycin here in the ED. He also keeps complaining of right lower extremity pain. Not worse or better with ambulation or rest.  Review of Systems: The patient denies anorexia, fever, weight loss,, vision loss, decreased hearing, hoarseness, chest pain, syncope, dyspnea on exertion, peripheral edema, balance deficits, hemoptysis, abdominal pain, melena, hematochezia, severe indigestion/heartburn, hematuria, incontinence, genital sores, muscle weakness, suspicious skin lesions, transient blindness, difficulty walking, depression, unusual weight change, abnormal bleeding, enlarged lymph nodes, angioedema, and breast masses.    Past Medical History  Diagnosis Date  . Depression   . Hypertension   . Hyperlipidemia   . Myocardial infarction 2000  . CHF (congestive heart failure)   . COPD (chronic obstructive pulmonary disease)   . Peripheral vascular disease, unspecified   . GERD (gastroesophageal reflux disease)   . Asthma   . Anxiety   . History of DVT of lower extremity   . CAD (coronary artery disease)    Past Surgical History  Procedure Laterality Date   . Femoral bypass  08/19/10    Right Fem-Pop  . Aortogram w/ ptca  09/292003   Social History:  reports that he has been smoking Cigarettes.  He has been smoking about 1.50 packs per day. He does not have any smokeless tobacco history on file. He reports that he does not drink alcohol or use illicit drugs. Lives at home a lot can perform all his ADLs  Allergies  Allergen Reactions  . Darvocet (Propoxyphene-Acetaminophen) Hives  . Haldol (Haloperidol Decanoate) Hives    Family History  Problem Relation Age of Onset  . Heart attack Mother   . Cirrhosis Father     Prior to Admission medications   Medication Sig Start Date End Date Taking? Authorizing Provider  amLODipine (NORVASC) 10 MG tablet Take 10 mg by mouth daily.   Yes Historical Provider, MD  colchicine 0.6 MG tablet Take 0.6 mg by mouth 2 (two) times daily.   Yes Historical Provider, MD  HYDROcodone-acetaminophen (NORCO) 7.5-325 MG per tablet Take 1 tablet by mouth 3 (three) times daily.   Yes Historical Provider, MD  meloxicam (MOBIC) 7.5 MG tablet Take 7.5 mg by mouth 2 (two) times daily.   Yes Historical Provider, MD  omeprazole (PRILOSEC) 20 MG capsule Take 20 mg by mouth 2 (two) times daily.    Yes Historical Provider, MD  pravastatin (PRAVACHOL) 20 MG tablet Take 20 mg by mouth daily.   Yes Historical Provider, MD   Physical Exam: Filed Vitals:   04/04/12 0935 04/04/12 1024 04/04/12 1100 04/04/12 1340  BP: 132/66  106/74 135/68  Pulse: 93 82 83 83  Temp: 97.7 F (36.5 C)   98.6 F (37 C)  TempSrc:  Oral   Oral  Resp: 22 17 16 20   SpO2: 94%  97% 97%    BP 135/68  Pulse 83  Temp(Src) 98.6 F (37 C) (Oral)  Resp 20  SpO2 97%  General Appearance:    Alert, cooperative, no distress, appears stated age  Head:    Normocephalic, without obvious abnormality, atraumatic  Eyes:    PERRL, conjunctiva/corneas clear, EOM's intact, fundi    benign, both eyes             Throat:   Lips, mucosa, and tongue normal;  teeth and gums normal  Neck:   Supple, symmetrical, trachea midline, no adenopathy;       thyroid:  No enlargement/tenderness/nodules; no carotid   bruit or JVD  Back:     Symmetric, no curvature, ROM normal, no CVA tenderness  Lungs:     Respirations unlabored, wheezing bilaterally   Chest wall:    No tenderness or deformity  Heart:    Regular rate and rhythm, S1 and S2 normal, no murmur, rub   or gallop  Abdomen:     Soft, non-tender, bowel sounds active all four quadrants,    no masses, no organomegaly        Extremities:  trace edema bilaterally no rashes ulcerations   Pulses:   2+ and symmetric all extremities  Skin:   Skin color, texture, turgor normal, no rashes or lesions  Lymph nodes:   Cervical, supraclavicular, and axillary nodes normal  Neurologic:   CNII-XII intact. Normal strength, sensation and reflexes      throughout    Labs on Admission:  Basic Metabolic Panel:  Recent Labs Lab 04/04/12 1002  NA 142  K 3.5  CL 106  CO2 26  GLUCOSE 128*  BUN 14  CREATININE 0.84  CALCIUM 10.0   Liver Function Tests: No results found for this basename: AST, ALT, ALKPHOS, BILITOT, PROT, ALBUMIN,  in the last 168 hours No results found for this basename: LIPASE, AMYLASE,  in the last 168 hours No results found for this basename: AMMONIA,  in the last 168 hours CBC:  Recent Labs Lab 04/04/12 1002  WBC 9.2  HGB 14.5  HCT 42.6  MCV 90.6  PLT 136*   Cardiac Enzymes: No results found for this basename: CKTOTAL, CKMB, CKMBINDEX, TROPONINI,  in the last 168 hours  BNP (last 3 results) No results found for this basename: PROBNP,  in the last 8760 hours CBG: No results found for this basename: GLUCAP,  in the last 168 hours  Radiological Exams on Admission: Dg Chest 2 View  04/04/2012  *RADIOLOGY REPORT*  Clinical Data: Chest pain  CHEST - 2 VIEW  Comparison: 03/14/2012  Findings: Cardiomegaly.  Patchy right mid and lower lung airspace opacity concerning for possible  pneumonia.  No confluent opacity on the left.  No effusions.  Mild hyperinflation.  No acute bony abnormality in  IMPRESSION: Patchy right perihilar and lower lobe opacities concerning for pneumonia.  Cardiomegaly, hyperinflation.   Original Report Authenticated By: Charlett Nose, M.D.    Ct Angio Chest Pe W/cm &/or Wo Cm  04/04/2012  *RADIOLOGY REPORT*  Clinical Data: Chest pain, hemoptysis, evaluate for PE  CT ANGIOGRAPHY CHEST  Technique:  Multidetector CT imaging of the chest using the standard protocol during bolus administration of intravenous contrast. Multiplanar reconstructed images including MIPs were obtained and reviewed to evaluate the vascular anatomy.  Contrast: OMNIPAQUE IOHEXOL 350 MG/ML SOLN  Comparison: Chest radiographs dated 04/04/2012  Findings:  No evidence of pulmonary embolism.  Multifocal patchy/nodular opacities in the right upper and lower lobes, suspicious for pneumonia.  Mild dependent atelectasis at the lung bases.  Trace right pleural fluid.  Visualized thyroid is unremarkable.  The heart is top normal in size.  No pericardial effusion. Coronary atherosclerosis.  Atherosclerotic calcifications of the aortic arch.  Fluid along the ascending aorta likely reflects the normal superior pericardial recess.  Narrowing of the right proximal brachiocephalic artery with associated calcified atherosclerotic plaque (series 4/image 19).  Mediastinal lymph nodes measuring up to mm short axis.  No suspicious hilar or axillary lymphadenopathy.  Postsurgical changes at the GE junction with small hiatal hernia. Cholecystectomy clips.  Degenerative changes of the visualized thoracolumbar spine.  IMPRESSION: No evidence of pulmonary embolism.  Multifocal patchy/nodular opacities in the right upper and lower lobes, suspicious for multifocal pneumonia.  Atherosclerosis with narrowing of the right proximal brachiocephalic artery.   Original Report Authenticated By: Charline Bills, M.D.     EKG:  Independently reviewed. Sinus rhythm no T wave inversions  Assessment/Plan   PNA (pneumonia): - I agree with current antibiotic regimen, we'll continue Rocephin and azithromycin daily. Get sputum cultures, and urine Legionella.  Continue him a PPI.  - He's currently nontoxic appearing, his saturations are well above 90% on 2 L, he only has mild wheezing on physical exam. And his vital signs are stable.   Acute respiratory failure secondary to COPD exacerbation - Has mild wheezing on physical exam, some desaturation to 88% earlier here in the emergency room. I will go ahead and start him IV Solu-Medrol, Spiriva and albuterol. Antibiotics per #1. This most likely secondary to his pneumonia.  - Counsel him about tobacco.   HTN (hypertension) -  Depression seems to be well controlled continue current treatment.  Lower extremity pain: - As severe peripheral vascular disease, had previous aortobifemoral bypass grafting in 2003 by Dr. Edilia Bo, admitted on 07/15/2010 4 PV angiogram and possible right SFA PTA (this was unsuccessful). I will go ahead and get ABIs. Narcotics for pain.  Code Status: full Family Communication: none Disposition Plan: inpateint 2-3 days  Time spent: [address] Rosine Beat [facility] Pager [phone]  If 7PM-7AM, please contact night-coverage [email or web address] Password [facility] 04/04/2012, 2:57 PM

## 2012-04-04 NOTE — Progress Notes (Signed)
Pt received from ED per stretcher. Oriented to room Placed on tele monitor.

## 2012-04-04 NOTE — ED Notes (Signed)
Call to [phone]

## 2012-04-04 NOTE — ED Provider Notes (Signed)
Medical screening examination/treatment/procedure(s) were conducted as a shared visit with non-physician practitioner(Kelly Humes) and myself.  I personally evaluated the patient during the encounter.  The patient presents here with complaints of dyspnea and hemoptysis over the past several days.  His right leg has become more painful to walk on and is more swollen.  He also has a history of peripheral vascular disease and has had procedures to restore flow to the leg in the past.   On exam, the vitals are stable and the patient is afebrile.  Oxygen sats are borderline low at 94%.  The heart is regular rate and rhythm and the lungs have slight rhonchi bilaterally.  The abdomen is benign.  The right leg is noted to have 3+ edema and the left leg 2+.  There is some redness to the right leg as well.    Workup today consisted of labs, US of the right leg, and ct of the chest.  These were negative for dvt and pe, however the chest xray was suggestive of pneumonia.  He was given rocephin and zithromax.  Medicine has been consulted for admission for treatment of pneumonia and hemoptysis.  Kenneth Lyons, MD 04/04/12 [phone]

## 2012-04-04 NOTE — Progress Notes (Signed)
VASCULAR LAB PRELIMINARY  PRELIMINARY  PRELIMINARY  PRELIMINARY  Right lower extremity venous duplex and ABIs completed.    Preliminary report:  Negative for DVT in the right leg.  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  (In a note from a visit with Dr. Edilia Bo  In 08/2010 it's mentioned that the same segment of the greater saphenous vein was thrombosed).  Limited arterial imaging of the right leg revealed the fem-pop bypass to be occluded.  The common femoral artery is monophasic and the proximal femoral artery is patent but appears to occluded not far from the proximal segment.  Dr. Judd Lien was called and an ABI was ordered.   Lower  Extremity Right    BP 160 Left  Dorsalis Pedis Not found 110    monophasic  peroneal Not found 112    monophasic  Posterior Tibial 52  Damped monophasic 120    monophasic  Ankle/Brachial Indices 0.33 0.75  Great toe pressure               20   (0.13)                                  76   (0.47)   [name], RVT 04/04/2012, 12:59 PM

## 2012-04-04 NOTE — ED Provider Notes (Signed)
History     CSN: [id number]  Arrival date & time 04/04/12  4098   First MD Initiated Contact with Patient 04/04/12 1012      Chief Complaint  Patient presents with  . Chest Pain    (Consider location/radiation/quality/duration/timing/severity/associated sxs/prior treatment) HPI Comments: Patient with PMH of GERD, HTN, HLD, CHF, PVD, and DVT presents for pain in his epigastric region/LUQ x 3 wks and coughing up blood x 2 days. Patient states he has had a productive cough for 2 days and has noticed some bright red blood in his sputum. Patient was seen on 03/14/12 for the same pain in his abdomen which is burning in nature and waxing and waning in nature. Work up included CXR and CT abdomen as well as labs which produced no significant or acute findings. States pain is still burning and is unchanged since he was last seen. Patient admits to swelling in his RLE. Denies fever, SOB, chest pain, and N/V/D. Patient is not on blood thinner. He is a 1.5ppd smoker. Patient is a poor historian.  The history is provided by the patient. No language interpreter was used.    Past Medical History  Diagnosis Date  . Depression   . Hypertension   . Hyperlipidemia   . Myocardial infarction 2000  . CHF (congestive heart failure)   . COPD (chronic obstructive pulmonary disease)   . Peripheral vascular disease, unspecified   . GERD (gastroesophageal reflux disease)   . Asthma   . Anxiety   . History of DVT of lower extremity   . CAD (coronary artery disease)     Past Surgical History  Procedure Laterality Date  . Femoral bypass  08/19/10    Right Fem-Pop  . Aortogram w/ ptca  09/292003    History reviewed. No pertinent family history.  History  Substance Use Topics  . Smoking status: Current Every Day Smoker -- 1.50 packs/day  . Smokeless tobacco: Not on file  . Alcohol Use: No     Review of Systems  Constitutional: Positive for diaphoresis. Negative for fever and unexpected weight  change.  HENT: Positive for sore throat. Negative for trouble swallowing and voice change.   Eyes: Negative for photophobia and visual disturbance.  Respiratory: Negative for chest tightness and shortness of breath.   Cardiovascular: Positive for leg swelling. Negative for chest pain and palpitations.  Gastrointestinal: Negative for nausea, vomiting and diarrhea.  Genitourinary: Negative.   Skin: Negative for color change and wound.  Neurological: Positive for light-headedness. Negative for syncope, weakness and numbness.  All other systems reviewed and are negative.    Allergies  Darvocet and Haldol  Home Medications   Current Outpatient Rx  Name  Route  Sig  Dispense  Refill  . amLODipine (NORVASC) 10 MG tablet   Oral   Take 10 mg by mouth daily.         . colchicine 0.6 MG tablet   Oral   Take 0.6 mg by mouth 2 (two) times daily.         . meloxicam (MOBIC) 7.5 MG tablet   Oral   Take 7.5 mg by mouth 2 (two) times daily.         [name] omeprazole (PRILOSEC) 20 MG capsule   Oral   Take 20 mg by mouth daily.           [name] oxyCODONE (ROXICODONE) 5 MG immediate release tablet   Oral   Take 1 tablet (5 mg total) by mouth every  4 (four) hours as needed for pain.   15 tablet   0   . pravastatin (PRAVACHOL) 20 MG tablet   Oral   Take 20 mg by mouth daily.           BP 132/66  Pulse 82  Temp(Src) 97.7 F (36.5 C) (Oral)  Resp 17  SpO2 94%  Physical Exam  Nursing note and vitals reviewed. Constitutional: He is oriented to person, place, and time. He appears well-developed. No distress.  Morbidly obese  HENT:  Head: Normocephalic and atraumatic.  Mouth/Throat: Oropharynx is clear and moist. No oropharyngeal exudate.  Eyes: Conjunctivae are normal. No scleral icterus.  Neck: Normal range of motion. Neck supple.  Cardiovascular: Normal rate, regular rhythm, normal heart sounds and intact distal pulses.   Pulmonary/Chest: Effort normal and breath sounds  normal. No respiratory distress. He has no wheezes. He has no rales.  + adventitious sounds  Abdominal: Soft. Bowel sounds are normal. He exhibits distension. He exhibits no mass. There is tenderness in the left upper quadrant. There is no rebound, no guarding, no tenderness at McBurney's point and negative Murphy's sign.  Musculoskeletal: Normal range of motion. He exhibits edema.       Right lower leg: He exhibits tenderness, swelling and edema. He exhibits no bony tenderness, no deformity and no laceration.       Right foot: He exhibits tenderness and swelling. He exhibits no bony tenderness and normal capillary refill.  Pitting edema in RLE  Neurological: He is alert and oriented to person, place, and time.  Skin: Skin is warm and dry. He is not diaphoretic.  Mild erythema of RLE  Psychiatric: He has a normal mood and affect. His behavior is normal.    ED Course  Procedures (including critical care time)  Labs Reviewed  CBC - Abnormal; Notable for the following:    Platelets 136 (*)    All other components within normal limits  BASIC METABOLIC PANEL  POCT I-STAT TROPONIN I   No results found.   No diagnosis found.   MDM  Patient with extensive PMH presents for blood tinged sputum and burning epigastric/LUQ pain. Will work up with labs, CXR, d-dimer, and U/S of the RLE. Patient signed out to Doran Durand PA-C for further work up and management at shift change. Patient work up discussed with Dr. Judd Lien who is in agreement.  Filed Vitals:   04/04/12 0935 04/04/12 1024 04/04/12 1100  BP: 132/66  106/74  Pulse: 93 82 83  Temp: 97.7 F (36.5 C)    TempSrc: Oral    Resp: 22 17 16   SpO2: 94%  97%     Antony Madura, PA-C 04/04/12 1129

## 2012-04-04 NOTE — Progress Notes (Signed)
Utilization Review Completed Luetta Piazza J. Caidin Heidenreich, RN, BSN, NCM [phone]  

## 2012-04-04 NOTE — ED Notes (Signed)
Pt back from XR and on monitor

## 2012-04-04 NOTE — ED Notes (Signed)
Pt reports left sided chest pain described as burning sensation off and on for the last 2 weeks. States for the last 2 days has felt warm at home and coughing up blood. States current smoker.

## 2012-04-05 LAB — COMPREHENSIVE METABOLIC PANEL
AST: 15 U/L (ref 0–37)
Albumin: 3.4 g/dL — ABNORMAL LOW (ref 3.5–5.2)
Alkaline Phosphatase: 81 U/L (ref 39–117)
BUN: 12 mg/dL (ref 6–23)
Chloride: 108 mEq/L (ref 96–112)
Creatinine, Ser: 0.75 mg/dL (ref 0.50–1.35)
Potassium: 4.1 mEq/L (ref 3.5–5.1)
Total Bilirubin: 0.3 mg/dL (ref 0.3–1.2)
Total Protein: 6.9 g/dL (ref 6.0–8.3)

## 2012-04-05 LAB — LEGIONELLA ANTIGEN, URINE: Legionella Antigen, Urine: NEGATIVE

## 2012-04-05 LAB — CBC
HCT: 43.2 % (ref 39.0–52.0)
MCH: 30.2 pg (ref 26.0–34.0)
MCV: 90 fL (ref 78.0–100.0)
RBC: 4.8 MIL/uL (ref 4.22–5.81)
WBC: 5.6 10*3/uL (ref 4.0–10.5)

## 2012-04-05 MED ORDER — PREDNISONE 50 MG PO TABS
50.0000 mg | ORAL_TABLET | Freq: Every day | ORAL | Status: DC
  Administered 2012-04-05 – 2012-04-06 (×2): 50 mg via ORAL
  Filled 2012-04-05 (×3): qty 1

## 2012-04-05 NOTE — Progress Notes (Signed)
Echocardiogram 2D Echocardiogram has been performed.  Tayonna Bacha 04/05/2012, 12:06 PM

## 2012-04-05 NOTE — Progress Notes (Signed)
Pt for echo this am and informed Dr. David Stall if pt can go off tele instructed yes and informed Marylene Land in echo.  Pt also made aware.  Amanda Pea, Charity fundraiser.

## 2012-04-05 NOTE — Progress Notes (Signed)
SATURATION QUALIFICATIONS: (This note is used to comply with regulatory documentation for home oxygen)  Patient Saturations on Room Air at Rest =91%  Patient Saturations on Room Air while Ambulating = 90-93%  Patient Saturations on  RA while Ambulating = 93%  Please briefly explain why patient needs home oxygen:

## 2012-04-05 NOTE — Evaluation (Signed)
Physical Therapy Evaluation Patient Details Name: [name] MRN: [id number] DOB: 05-27-1950 Today's Date: 04/05/2012 Time: 2130-8657 PT Time Calculation (min): 18 min  PT Assessment / Plan / Recommendation Clinical Impression  Pt admitted with RUL PNA and hemoptysis. Pt with sats 91% on RA prior to ambulation and sats maintained 90-93% on RA with all of gait with HR 94 end of session sats 93% on RA, no distress, pt with bil LE edema. Pt educated for limiting time on feet and elevating legs to prevent edema. Pt reports difficulty with bathing and discussed different shower seats but pt denied need for these despite education and assist to prevent falls with bathing.  All education completed no further needs at this time pt agreeable that he is at baseline level and no further therapy needs.     PT Assessment  Patent does not need any further PT services    Follow Up Recommendations  No PT follow up    Does the patient have the potential to tolerate intense rehabilitation      Barriers to Discharge        Equipment Recommendations  Other (comment) (recommend shower seat but pt refuses)    Recommendations for Other Services     Frequency      Precautions / Restrictions Precautions Precautions: Fall   Pertinent Vitals/Pain No pain      Mobility  Bed Mobility Bed Mobility: Supine to Sit;Sitting - Scoot to Edge of Bed Supine to Sit: 6: Modified independent (Device/Increase time);HOB flat Sitting - Scoot to Edge of Bed: 6: Modified independent (Device/Increase time) Transfers Transfers: Sit to Stand;Stand to Sit Sit to Stand: 6: Modified independent (Device/Increase time);From bed Stand to Sit: 6: Modified independent (Device/Increase time);To chair/3-in-1 Ambulation/Gait Ambulation/Gait Assistance: 6: Modified independent (Device/Increase time) Ambulation Distance (Feet): 450 Feet Assistive device: None Gait Pattern: Within Functional Limits Gait velocity:  WFL Stairs: No    Exercises     PT Diagnosis:    PT Problem List:   PT Treatment Interventions:     PT Goals    Visit Information  Last PT Received On: 04/05/12 Assistance Needed: +1    Subjective Data  Subjective: I go out and play pool but have to stop after a couple games cause my legs swell Patient Stated Goal: return home   Prior Functioning  Home Living Lives With: Alone Available Help at Discharge: Personal care attendant (7days a wk) Type of Home: Apartment Home Access: Elevator Home Layout: One level Bathroom Shower/Tub: Engineer, manufacturing systems: Handicapped height Home Adaptive Equipment: Straight cane Prior Function Level of Independence: Needs assistance Needs Assistance: Light Housekeeping;Meal Prep Meal Prep: Total Light Housekeeping: Total Able to Take Stairs?: No Driving: No Vocation: On disability Comments: aide does all cooking and housework, pt does ADLs on his own Communication Communication: No difficulties    Cognition  Cognition Overall Cognitive Status: Appears within functional limits for tasks assessed/performed Arousal/Alertness: Awake/alert Orientation Level: Appears intact for tasks assessed Behavior During Session: [facility] for tasks performed    Extremity/Trunk Assessment Right Upper Extremity Assessment RUE ROM/Strength/Tone: [facility] for tasks assessed Left Upper Extremity Assessment LUE ROM/Strength/Tone: [facility] for tasks assessed Right Lower Extremity Assessment RLE ROM/Strength/Tone: [facility] for tasks assessed Left Lower Extremity Assessment LLE ROM/Strength/Tone: [facility] for tasks assessed Trunk Assessment Trunk Assessment: Normal   Balance    End of Session PT - End of Session Equipment Utilized During Treatment: Gait belt Activity Tolerance: Patient tolerated treatment well Patient left: in chair;with call bell/phone within  reach Nurse Communication: Mobility status  GP     [name] 04/05/2012, 12:40 PM Delaney Meigs, PT [phone]

## 2012-04-05 NOTE — Progress Notes (Signed)
[facility] PROGRESS NOTE Interim History: 62 y.o. male  Past medical history of severe peripheral vascular disease,, hypertension, diastolic heart failure with an EF of 65%, ongoing tobacco abuser comes in for his hemoptysis 2 days prior to admission progressively getting worse. The patient was previously seen 03/15/2011 for some abdominal pain in the ED. Workup produced no significant finding so he was discharged home. He continues to complain of shortness of breath, also came to the emergency room. Here CT of the chest was done that shows a right upper lobe pneumonia, no PE so we were consulted for further evaluation.  He was given Rocephin and azithromycin here in the ED.  He also keeps complaining of right lower extremity pain. Not worse or better with ambulation or rest.     Assessment/Plan:   PNA (pneumonia): - rocephin 2.4.2015 - afebrile, no leukocytosis. - urine legionella, HIV  2.24.2014 negative.   COPD (chronic obstructive pulmonary disease) - IV steroids, spriva albuterol. - change steroids to orals. - saturating > 90% on 2 l. - ambulate without O2.    HTN (hypertension) - Bp High. - Norvasc, add low dose diuretics. - Echo pending  Code Status: full  Family Communication: none  Disposition Plan: inpateint 2-3 days    Consultants:  none  Procedures:  Echo 2.25.2014  - ct angio 2.24.2014: negative for PE.  Antibiotics:  Rocephin azithro 2.24.2014  HPI/Subjective: Feels better no further hempotysis  Objective: Filed Vitals:   04/04/12 2100 04/05/12 0127 04/05/12 0555 04/05/12 0648  BP:  144/72 163/81 156/76  Pulse:  80 81   Temp:  98.4 F (36.9 C) 98.3 F (36.8 C)   TempSrc:  Oral Oral   Resp:  20 20   Height:      Weight:   111.358 kg (245 lb 8 oz)   SpO2: 96% 97% 93%     Intake/Output Summary (Last 24 hours) at 04/05/12 0839 Last data filed at 04/05/12 0600  Gross per 24 hour  Intake    360 ml  Output   1300 ml  Net   -940 ml    Filed Weights   04/04/12 1552 04/05/12 0555  Weight: 110.7 kg (244 lb 0.8 oz) 111.358 kg (245 lb 8 oz)    Exam:  General: Alert, awake, oriented x3, in no acute distress.  HEENT: No bruits, no goiter.  Heart: Regular rate and rhythm, without murmurs, rubs, gallops.  Lungs: Good air movement, clear to auscultation. Abdomen: Soft, nontender, nondistended, positive bowel sounds.  Neuro: Grossly intact, nonfocal.   Data Reviewed: Basic Metabolic Panel:  Recent Labs Lab 04/04/12 1002 04/04/12 1608 04/05/12 0610  NA 142  --  144  K 3.5  --  4.1  CL 106  --  108  CO2 26  --  26  GLUCOSE 128*  --  197*  BUN 14  --  12  CREATININE 0.84 0.87 0.75  CALCIUM 10.0  --  10.1   Liver Function Tests:  Recent Labs Lab 04/05/12 0610  AST 15  ALT 18  ALKPHOS 81  BILITOT 0.3  PROT 6.9  ALBUMIN 3.4*   No results found for this basename: LIPASE, AMYLASE,  in the last 168 hours No results found for this basename: AMMONIA,  in the last 168 hours CBC:  Recent Labs Lab 04/04/12 1002 04/04/12 1608 04/05/12 0610  WBC 9.2 8.1 5.6  HGB 14.5 14.3 14.5  HCT 42.6 42.4 43.2  MCV 90.6 91.0 90.0  PLT 136* 160 153  Cardiac Enzymes: No results found for this basename: CKTOTAL, CKMB, CKMBINDEX, TROPONINI,  in the last 168 hours BNP (last 3 results) No results found for this basename: PROBNP,  in the last 8760 hours CBG: No results found for this basename: GLUCAP,  in the last 168 hours  No results found for this or any previous visit (from the past 240 hour(s)).   Studies: Dg Chest 2 View  04/04/2012  *RADIOLOGY REPORT*  Clinical Data: Chest pain  CHEST - 2 VIEW  Comparison: 03/14/2012  Findings: Cardiomegaly.  Patchy right mid and lower lung airspace opacity concerning for possible pneumonia.  No confluent opacity on the left.  No effusions.  Mild hyperinflation.  No acute bony abnormality in  IMPRESSION: Patchy right perihilar and lower lobe opacities concerning for pneumonia.   Cardiomegaly, hyperinflation.   Original Report Authenticated By: Charlett Nose, M.D.    Ct Angio Chest Pe W/cm &/or Wo Cm  04/04/2012  *RADIOLOGY REPORT*  Clinical Data: Chest pain, hemoptysis, evaluate for PE  CT ANGIOGRAPHY CHEST  Technique:  Multidetector CT imaging of the chest using the standard protocol during bolus administration of intravenous contrast. Multiplanar reconstructed images including MIPs were obtained and reviewed to evaluate the vascular anatomy.  Contrast: OMNIPAQUE IOHEXOL 350 MG/ML SOLN  Comparison: Chest radiographs dated 04/04/2012  Findings: No evidence of pulmonary embolism.  Multifocal patchy/nodular opacities in the right upper and lower lobes, suspicious for pneumonia.  Mild dependent atelectasis at the lung bases.  Trace right pleural fluid.  Visualized thyroid is unremarkable.  The heart is top normal in size.  No pericardial effusion. Coronary atherosclerosis.  Atherosclerotic calcifications of the aortic arch.  Fluid along the ascending aorta likely reflects the normal superior pericardial recess.  Narrowing of the right proximal brachiocephalic artery with associated calcified atherosclerotic plaque (series 4/image 19).  Mediastinal lymph nodes measuring up to mm short axis.  No suspicious hilar or axillary lymphadenopathy.  Postsurgical changes at the GE junction with small hiatal hernia. Cholecystectomy clips.  Degenerative changes of the visualized thoracolumbar spine.  IMPRESSION: No evidence of pulmonary embolism.  Multifocal patchy/nodular opacities in the right upper and lower lobes, suspicious for multifocal pneumonia.  Atherosclerosis with narrowing of the right proximal brachiocephalic artery.   Original Report Authenticated By: Charline Bills, M.D.     Scheduled Meds: . amLODipine  10 mg Oral Daily  . aspirin  325 mg Oral STAT  . azithromycin  500 mg Oral Q24H  . cefTRIAXone (ROCEPHIN)  IV  1 g Intravenous Q24H  . colchicine  0.6 mg Oral BID  .  heparin  5,000 Units Subcutaneous Q8H  . HYDROcodone-acetaminophen  1 tablet Oral TID  . methylPREDNISolone (SOLU-MEDROL) injection  80 mg Intravenous Q12H  . pantoprazole  40 mg Oral Daily  . simvastatin  20 mg Oral q1800  . sodium chloride  3 mL Intravenous Q12H  . sodium chloride  3 mL Intravenous Q12H  . tiotropium  18 mcg Inhalation Daily   Continuous Infusions:    Marinda Elk  [facility] Pager [phone]. If 8PM-8AM, please contact night-coverage at [email or web address], password [facility] 04/05/2012, 8:39 AM  LOS: 1 day

## 2012-04-06 DIAGNOSIS — J449 Chronic obstructive pulmonary disease, unspecified: Secondary | ICD-10-CM

## 2012-04-06 DIAGNOSIS — I10 Essential (primary) hypertension: Secondary | ICD-10-CM

## 2012-04-06 DIAGNOSIS — J4489 Other specified chronic obstructive pulmonary disease: Secondary | ICD-10-CM

## 2012-04-06 DIAGNOSIS — J189 Pneumonia, unspecified organism: Principal | ICD-10-CM

## 2012-04-06 MED ORDER — LEVOFLOXACIN 750 MG PO TABS: 750.0000 mg | ORAL_TABLET | Freq: Every day | ORAL | Status: DC

## 2012-04-06 MED ORDER — TIOTROPIUM BROMIDE MONOHYDRATE 18 MCG IN CAPS: 18.0000 ug | ORAL_CAPSULE | Freq: Every day | RESPIRATORY_TRACT | Status: DC

## 2012-04-06 MED ORDER — PREDNISONE 10 MG PO TABS: ORAL_TABLET | ORAL | Status: DC

## 2012-04-06 NOTE — Plan of Care (Signed)
Problem: Phase I Progression Outcomes Goal: Voiding-avoid urinary catheter unless indicated Outcome: Completed/Met Date Met:  04/06/12 Patient uses urinal.

## 2012-04-06 NOTE — Discharge Summary (Signed)
Physician Discharge Summary  [name]:[id number] DOB: 28-Sep-1950 DOA: 04/04/2012  PCP: Elpidio Anis, PA  Admit date: 04/04/2012 Discharge date: 04/06/2012  Time spent: 30 minutes  Recommendations for Outpatient Follow-up:  1. Follow up with PCP in 2 weeks. 2. Titrate Bp meds as tolerate it.  Discharge Diagnoses:  Active Problems:   PNA (pneumonia)   COPD (chronic obstructive pulmonary disease)   HTN (hypertension)   Discharge Condition: stable  Diet recommendation: heart healthy   Filed Weights   04/04/12 1552 04/05/12 0555  Weight: 110.7 kg (244 lb 0.8 oz) 111.358 kg (245 lb 8 oz)    History of present illness:  62 y.o. male  Past medical history of severe peripheral vascular disease,, hypertension, diastolic heart failure with an EF of 65%, ongoing tobacco abuser comes in for his hemoptysis 2 days prior to admission progressively getting worse. The patient was previously seen 03/15/2011 for some abdominal pain in the ED. Workup produced no significant finding so he was discharged home. He continues to complain of shortness of breath, also came to the emergency room. Here CT of the chest was done that shows a right upper lobe pneumonia, no PE so we were consulted for further evaluation.  He was given Rocephin and azithromycin here in the ED.  He also keeps complaining of right lower extremity pain. Not worse or better with ambulation or rest.   Hospital Course:  PNA (pneumonia):  - rocephin and azythro 2.24.2015 - 2.26.2014 - afebrile, no leukocytosis.  - urine legionella, HIV 2.24.2014 negative.  - change to levaquin which he will continue as an outpatient.  COPD (chronic obstructive pulmonary disease)  - IV steroids, spriva albuterol on admission. - change steroids to orals  - saturating > 90% on 2 l.  - ambulate without O2. No need desaturarion.  HTN (hypertension)  - Bp High.  - Norvasc, add low dose diuretics.  - Echo 2.25.2014 ( see below). - follow  up with PCP titrate meds as tolerate it.   Procedures: ECHO 2.25.2014: Systolic function was normal. The estimated ejection fraction was in the range of 60% to 65%. Wall motion was normal; there were no regional wall motion abnormalities. Doppler parameters are consistent with abnormal left ventricular relaxation (grade 1 diastolic dysfunction).    Consultations:  none  Discharge Exam: Filed Vitals:   04/05/12 0648 04/05/12 0928 04/05/12 1300 04/05/12 2052  BP: 156/76 160/74 146/71 129/62  Pulse:   82 88  Temp:   98 F (36.7 C) 98.2 F (36.8 C)  TempSrc:   Oral Oral  Resp:   20 18  Height:      Weight:      SpO2:   96% 94%    General: a&o X3 Cardiovascular : RRR Respiratory: good air movement CTA B/L  Discharge Instructions  Discharge Orders   Future Orders Complete By Expires     Diet - low sodium heart healthy  As directed     Increase activity slowly  As directed         Medication List    TAKE these medications       amLODipine 10 MG tablet  Commonly known as:  NORVASC  Take 10 mg by mouth daily.     colchicine 0.6 MG tablet  Take 0.6 mg by mouth 2 (two) times daily.     HYDROcodone-acetaminophen 7.5-325 MG per tablet  Commonly known as:  NORCO  Take 1 tablet by mouth 3 (three) times daily.  levofloxacin 750 MG tablet  Commonly known as:  LEVAQUIN  Take 1 tablet (750 mg total) by mouth daily.     meloxicam 7.5 MG tablet  Commonly known as:  MOBIC  Take 7.5 mg by mouth 2 (two) times daily.     omeprazole 20 MG capsule  Commonly known as:  PRILOSEC  Take 20 mg by mouth 2 (two) times daily.     pravastatin 20 MG tablet  Commonly known as:  PRAVACHOL  Take 20 mg by mouth daily.           Follow-up Information   Follow up with GRAY, ERIN, PA In 2 weeks. (hospital follow up)    Contact information:   [address] [place] [address] [phone]        The results of significant diagnostics from this hospitalization (including  imaging, microbiology, ancillary and laboratory) are listed below for reference.    Significant Diagnostic Studies: Dg Chest 2 View  04/04/2012  *RADIOLOGY REPORT*  Clinical Data: Chest pain  CHEST - 2 VIEW  Comparison: 03/14/2012  Findings: Cardiomegaly.  Patchy right mid and lower lung airspace opacity concerning for possible pneumonia.  No confluent opacity on the left.  No effusions.  Mild hyperinflation.  No acute bony abnormality in  IMPRESSION: Patchy right perihilar and lower lobe opacities concerning for pneumonia.  Cardiomegaly, hyperinflation.   Original Report Authenticated By: Charlett Nose, M.D.    Dg Chest 2 View  03/14/2012  *RADIOLOGY REPORT*  Clinical Data: Chest pain.  CHEST - 2 VIEW  Comparison: January 04, 2012.  Findings: Stable cardiomegaly.  Bony thorax is intact.  No acute pulmonary disease is noted.  IMPRESSION: No acute cardiopulmonary abnormality seen.   Original Report Authenticated By: Lupita Raider.,  M.D.    Ct Angio Chest Pe W/cm &/or Wo Cm  04/04/2012  *RADIOLOGY REPORT*  Clinical Data: Chest pain, hemoptysis, evaluate for PE  CT ANGIOGRAPHY CHEST  Technique:  Multidetector CT imaging of the chest using the standard protocol during bolus administration of intravenous contrast. Multiplanar reconstructed images including MIPs were obtained and reviewed to evaluate the vascular anatomy.  Contrast: OMNIPAQUE IOHEXOL 350 MG/ML SOLN  Comparison: Chest radiographs dated 04/04/2012  Findings: No evidence of pulmonary embolism.  Multifocal patchy/nodular opacities in the right upper and lower lobes, suspicious for pneumonia.  Mild dependent atelectasis at the lung bases.  Trace right pleural fluid.  Visualized thyroid is unremarkable.  The heart is top normal in size.  No pericardial effusion. Coronary atherosclerosis.  Atherosclerotic calcifications of the aortic arch.  Fluid along the ascending aorta likely reflects the normal superior pericardial recess.  Narrowing of the  right proximal brachiocephalic artery with associated calcified atherosclerotic plaque (series 4/image 19).  Mediastinal lymph nodes measuring up to mm short axis.  No suspicious hilar or axillary lymphadenopathy.  Postsurgical changes at the GE junction with small hiatal hernia. Cholecystectomy clips.  Degenerative changes of the visualized thoracolumbar spine.  IMPRESSION: No evidence of pulmonary embolism.  Multifocal patchy/nodular opacities in the right upper and lower lobes, suspicious for multifocal pneumonia.  Atherosclerosis with narrowing of the right proximal brachiocephalic artery.   Original Report Authenticated By: Charline Bills, M.D.    Ct Abdomen Pelvis W Contrast  03/14/2012  *RADIOLOGY REPORT*  Clinical Data: Left upper quadrant abdominal pain.  CT ABDOMEN AND PELVIS WITH CONTRAST  Technique:  Multidetector CT imaging of the abdomen and pelvis was performed following the standard protocol during bolus administration of intravenous contrast.  Contrast: OMNIPAQUE IOHEXOL 300 MG/ML  SOLN  Comparison: CT 10/09/2009  Findings: Motion artifact at the lung bases.  No evidence for free intraperitoneal air.  Surgical clips at the GE junction are consistent with previous fundoplication.  No gross abnormality to the liver and the gallbladder has been removed.  Portal venous system is patent. Again noted is mild stranding in the central small bowel mesentery which is unchanged.  No gross abnormality to the pancreas, spleen or adrenal glands.  There is mild fullness and nodularity in the left adrenal gland which is unchanged.  No hydronephrosis. Stable scarring along the left kidney lower pole.  The patient has undergone an aortobifemoral bypass graft.  The bypass limbs appear to be patent. Limited evaluation of the femoral/femoral bypass graft.  Degenerative disc changes at L4-L5 and L5-S1.  There is a lymph node between the aorta and inferior vena cava on sequence two, image 34 which measures 1.3 x  1.1 cm and previously measured 1.2 x 1.1 cm.  Overall, there is no significant abdominal or pelvic lymphadenopathy.  Few calcifications within the prostate gland.  There is fluid in the urinary bladder.  No acute bony abnormality.  There may be old left rib fractures.  IMPRESSION: No acute abnormalities within the abdomen or pelvis.  Postsurgical changes consistent with an aortobifemoral bypass graft and a fundoplication.   Original Report Authenticated By: Richarda Overlie, M.D.     Microbiology: No results found for this or any previous visit (from the past 240 hour(s)).   Labs: Basic Metabolic Panel:  Recent Labs Lab 04/04/12 1002 04/04/12 1608 04/05/12 0610  NA 142  --  144  K 3.5  --  4.1  CL 106  --  108  CO2 26  --  26  GLUCOSE 128*  --  197*  BUN 14  --  12  CREATININE 0.84 0.87 0.75  CALCIUM 10.0  --  10.1   Liver Function Tests:  Recent Labs Lab 04/05/12 0610  AST 15  ALT 18  ALKPHOS 81  BILITOT 0.3  PROT 6.9  ALBUMIN 3.4*   No results found for this basename: LIPASE, AMYLASE,  in the last 168 hours No results found for this basename: AMMONIA,  in the last 168 hours CBC:  Recent Labs Lab 04/04/12 1002 04/04/12 1608 04/05/12 0610  WBC 9.2 8.1 5.6  HGB 14.5 14.3 14.5  HCT 42.6 42.4 43.2  MCV 90.6 91.0 90.0  PLT 136* 160 153   Cardiac Enzymes: No results found for this basename: CKTOTAL, CKMB, CKMBINDEX, TROPONINI,  in the last 168 hours BNP: BNP (last 3 results) No results found for this basename: PROBNP,  in the last 8760 hours CBG: No results found for this basename: GLUCAP,  in the last 168 hours  Signed:  Marinda Elk  [facility] 04/06/2012, 9:26 AM

## 2012-04-10 ENCOUNTER — Encounter: Payer: Self-pay | Admitting: *Deleted

## 2012-04-21 ENCOUNTER — Ambulatory Visit
Admission: RE | Admit: 2012-04-21 | Discharge: 2012-04-21 | Disposition: A | Discharge status: Home / Self Care | Payer: Medicaid Other | Source: Ambulatory Visit | Attending: Family Medicine | Admitting: Family Medicine

## 2012-04-21 ENCOUNTER — Other Ambulatory Visit: Payer: Self-pay | Admitting: Family Medicine

## 2012-04-21 DIAGNOSIS — J189 Pneumonia, unspecified organism: Secondary | ICD-10-CM

## 2012-05-04 ENCOUNTER — Other Ambulatory Visit: Payer: Self-pay | Admitting: Orthopedic Surgery

## 2012-05-04 DIAGNOSIS — R52 Pain, unspecified: Secondary | ICD-10-CM

## 2012-05-04 DIAGNOSIS — R609 Edema, unspecified: Secondary | ICD-10-CM

## 2012-05-06 ENCOUNTER — Encounter: Payer: Self-pay | Admitting: Cardiology

## 2012-05-11 ENCOUNTER — Ambulatory Visit
Admission: RE | Admit: 2012-05-11 | Discharge: 2012-05-11 | Disposition: A | Discharge status: Home / Self Care | Payer: Medicaid Other | Source: Ambulatory Visit | Attending: Orthopedic Surgery | Admitting: Orthopedic Surgery

## 2012-05-11 DIAGNOSIS — R52 Pain, unspecified: Secondary | ICD-10-CM

## 2012-05-11 DIAGNOSIS — R609 Edema, unspecified: Secondary | ICD-10-CM

## 2012-05-11 IMAGING — CR DG CHEST 2V
2 series · 2 of 2 positions shown · non-contrast
Comparison: 05/21/2010 and 04/16/2008

CLINICAL DATA: Coughing up blood.  Chest pain and shortness of
breath.

CHEST - 2 VIEW

[w chest pa]
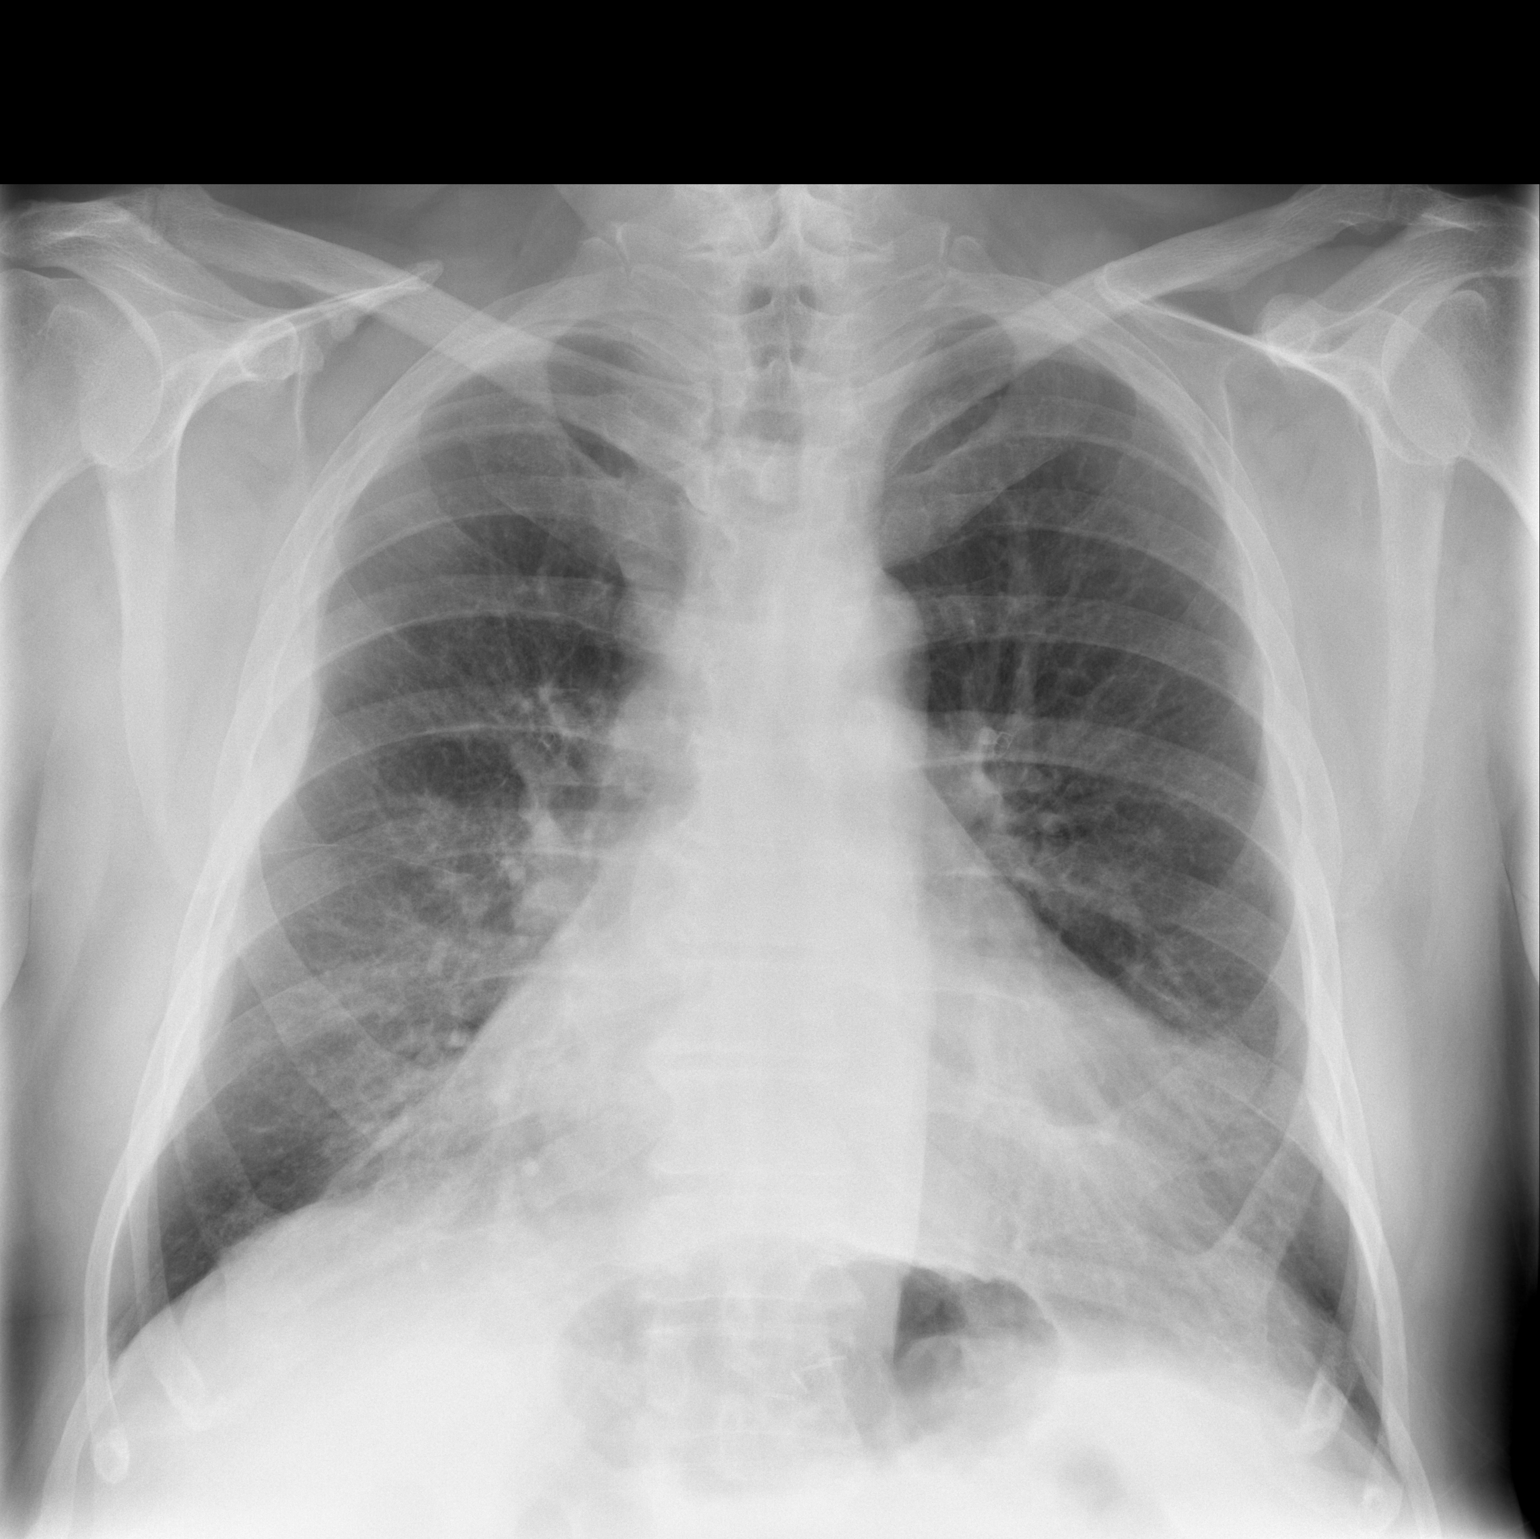

[w chest lat]
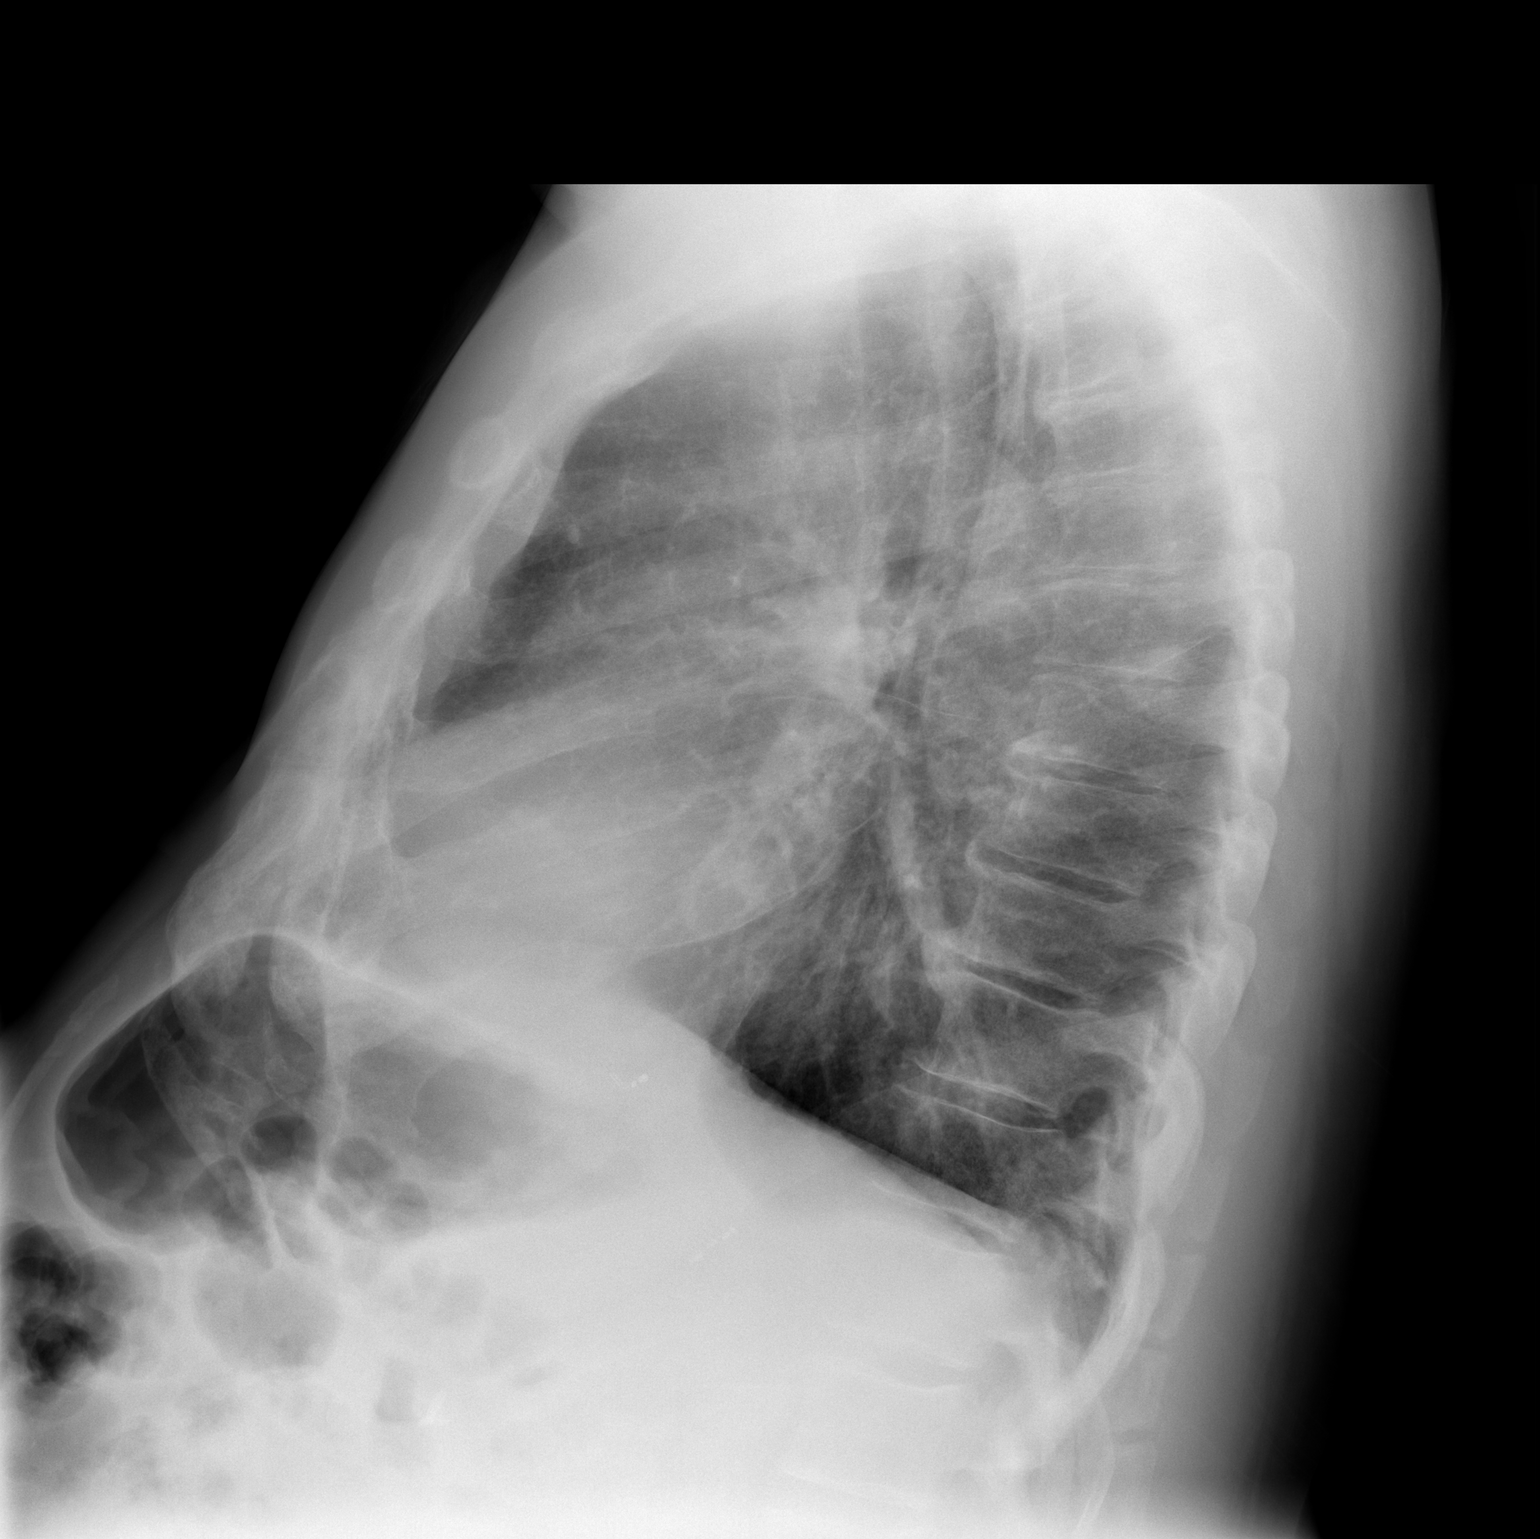

[2 of 2 positions shown; findings below may reference images not displayed]

FINDINGS: Opacity the right cardiophrenic angle and at the cardiac
apex is seen to represent fat pads on CT scan from 10/09/2009.
Lungs are hyperexpanded. There is some increased airspace disease
in the right lung base. The cardiopericardial silhouette is
enlarged. Imaged bony structures of the thorax are intact. .

Focal pleural thickening in the right chest is unchanged since the
04/16/2008 study.
IMPRESSION: Prominent bilateral pericardial fat pads although there is some
superimposed patchy airspace disease in the right base on today's
study.  This could be secondary to atelectasis or pneumonia.

## 2012-05-21 IMAGING — CR DG CHEST 2V
2 series · 2 of 2 positions shown · non-contrast
Comparison: 08/08/2010

CLINICAL DATA: Preop right fem-pop bypass

CHEST - 2 VIEW

[view not recorded (1 of 2)]
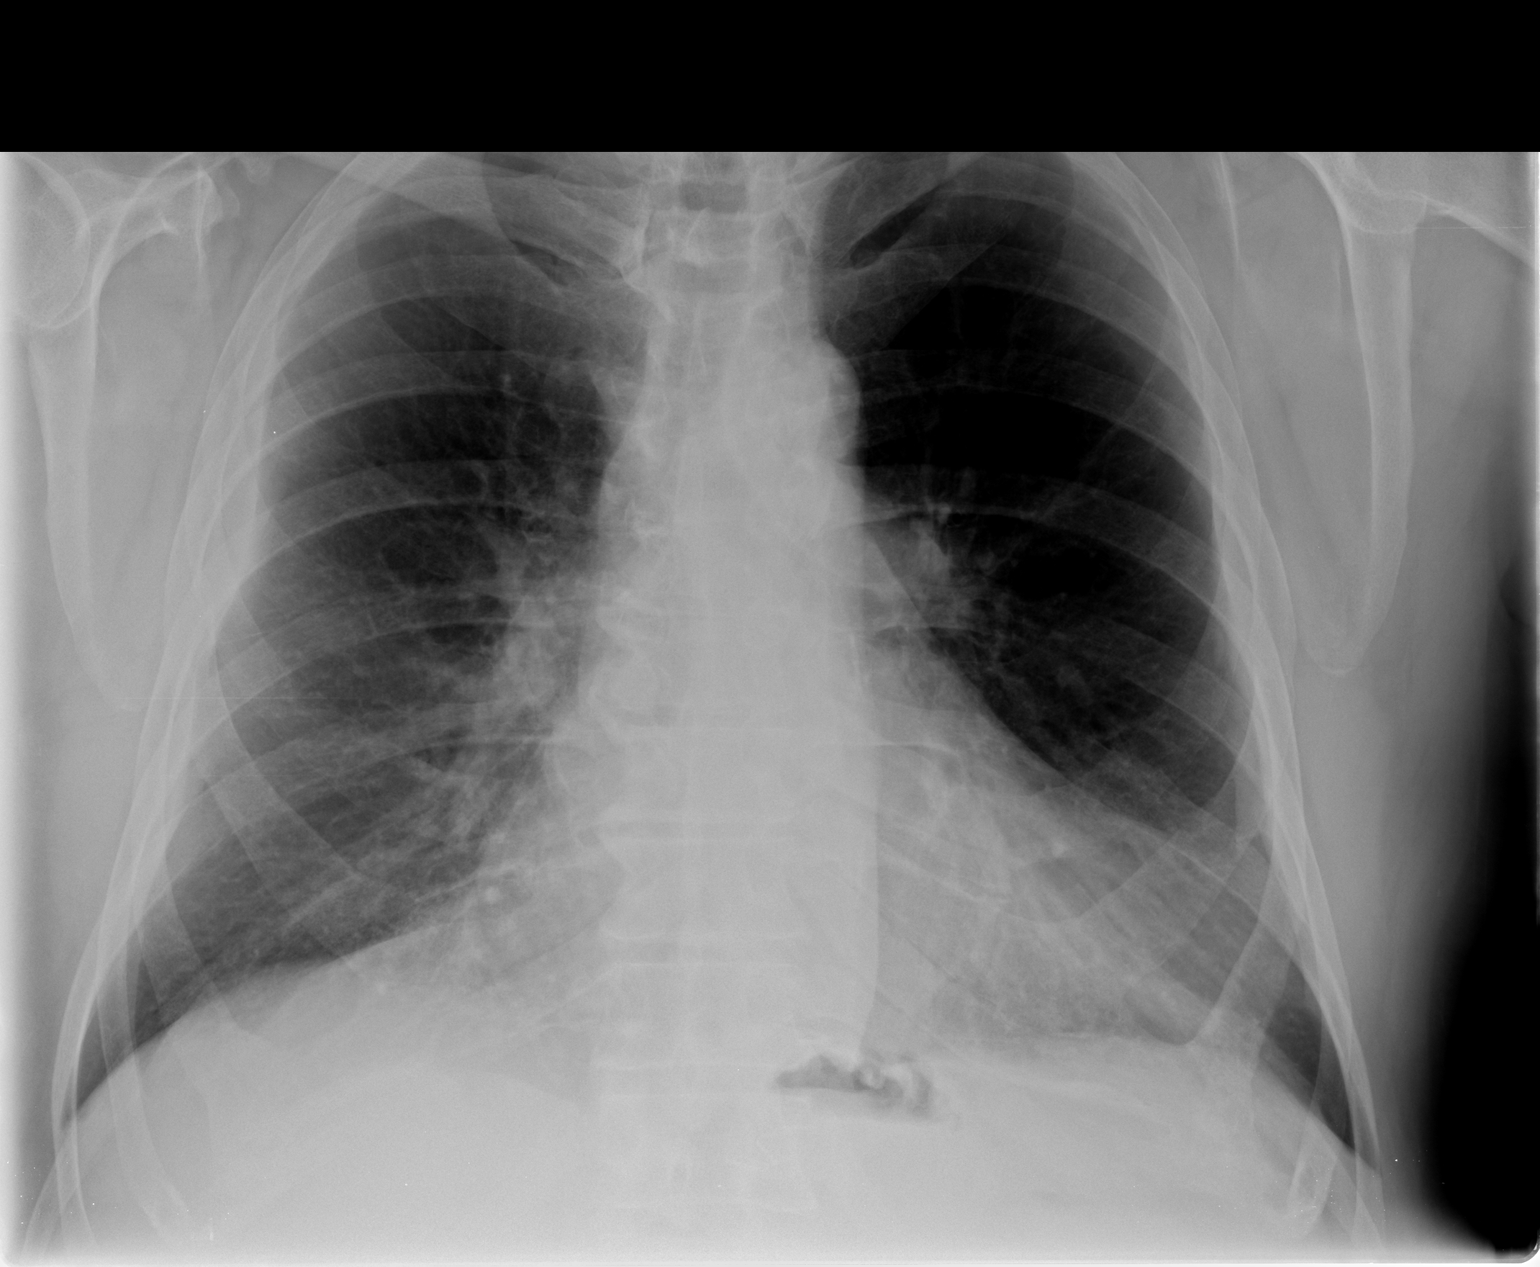

[view not recorded (2 of 2)]
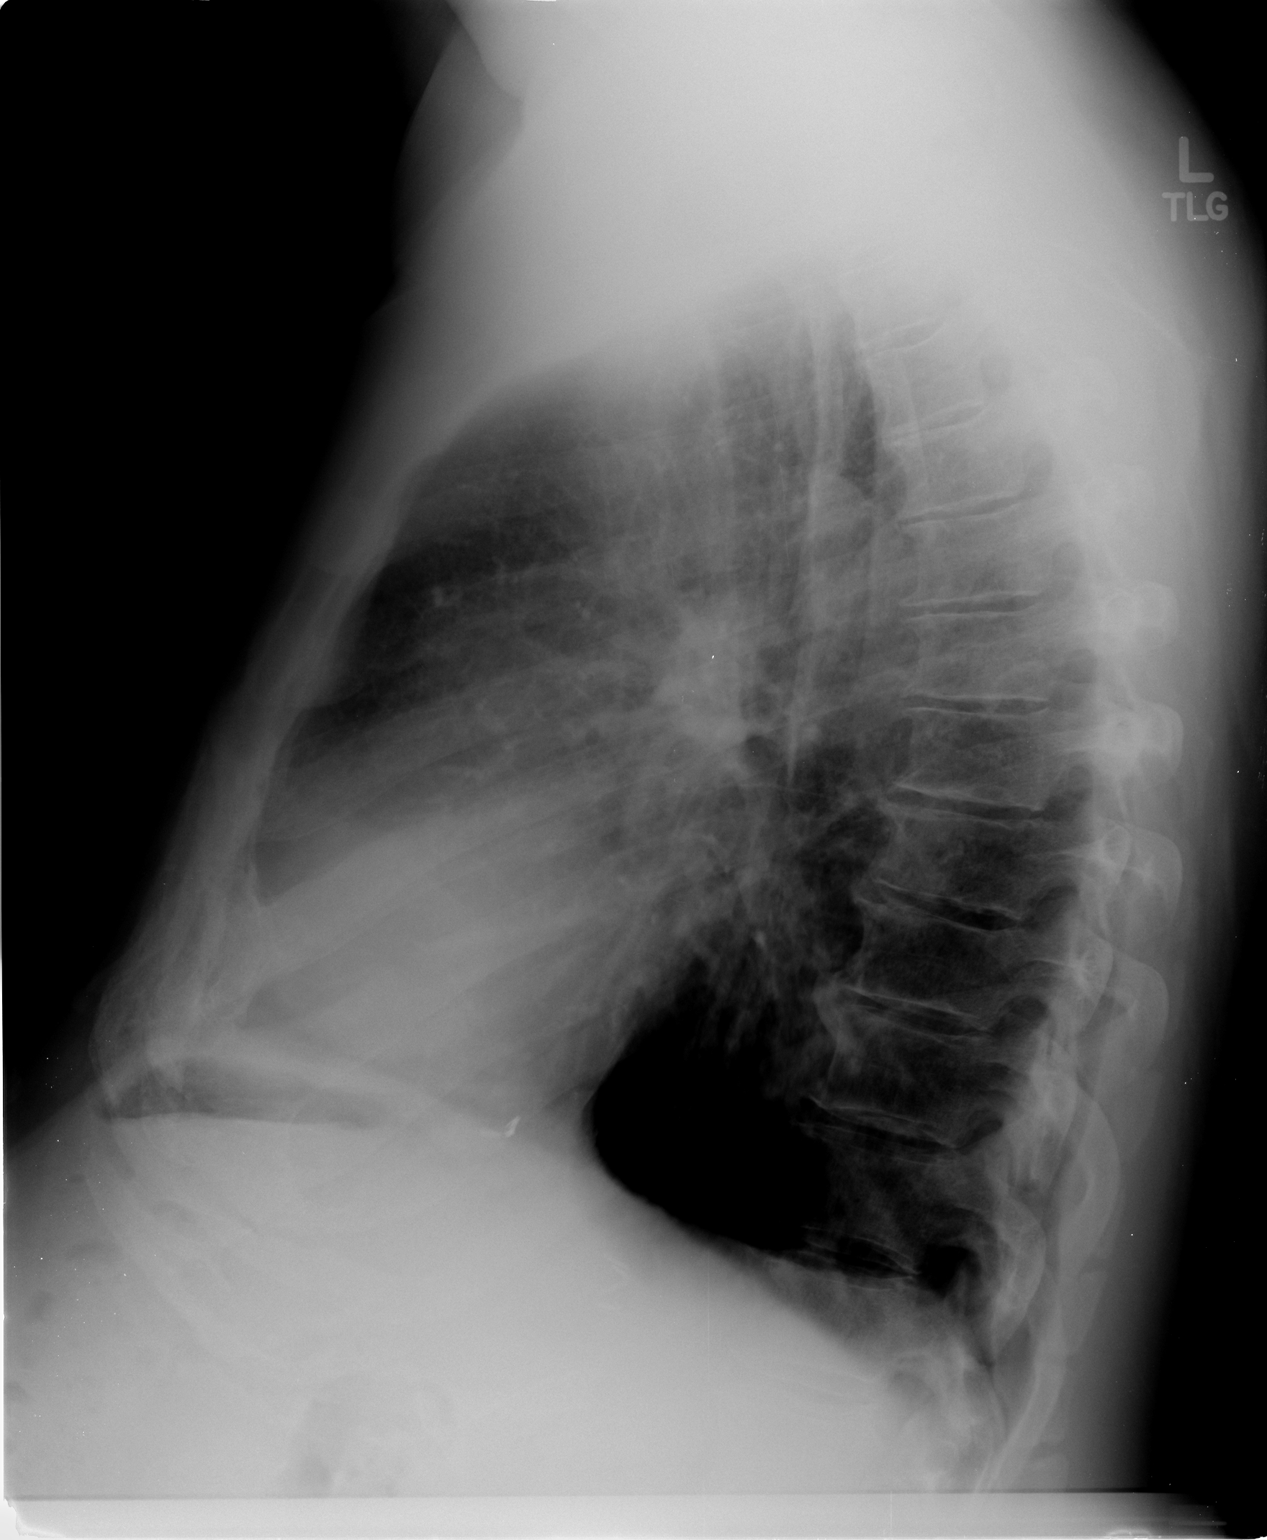

[2 of 2 positions shown; findings below may reference images not displayed]

FINDINGS: Lungs are clear. No pleural effusion or pneumothorax.

Heart is top normal in size.

Degenerative changes of the visualized thoracolumbar spine.
IMPRESSION: No evidence of acute cardiopulmonary disease.

## 2012-05-24 IMAGING — CR DG CHEST 1V PORT
1 series · 1 of 1 positions shown · non-contrast
Comparison: 08/18/2010

CLINICAL DATA: Pulmonary edema.  Short of breath.

PORTABLE CHEST - 1 VIEW

[AP]
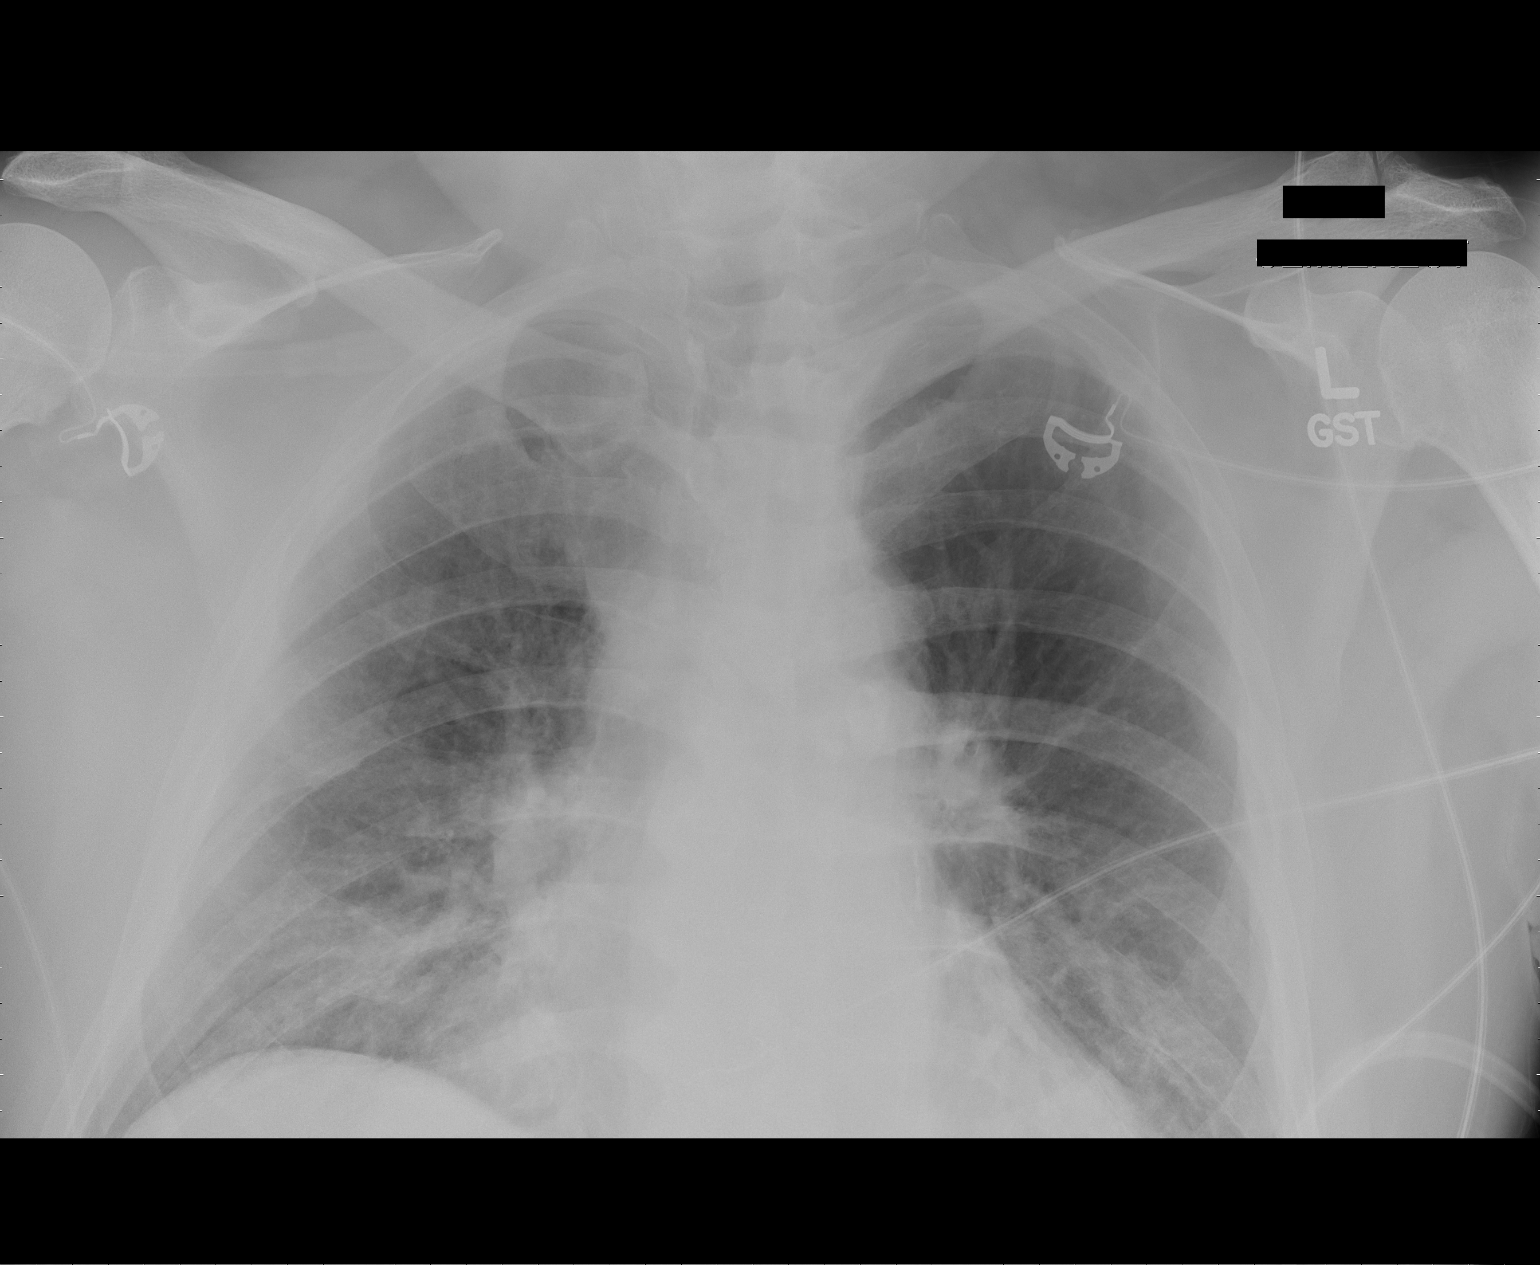

[1 of 1 positions shown; findings below may reference images not displayed]

FINDINGS: Shallow inspiration.  Borderline heart size and pulmonary
vascularity, likely normal for technique.  There is suggestion of
increasing density in the lung bases since the previous study which
might represent developing atelectasis or basilar infiltration.  No
pneumothorax.
IMPRESSION: Suggestion of developing basilar infiltration or atelectasis with
shallow inspiration.

## 2012-05-25 IMAGING — CR DG CHEST 1V PORT
1 series · 1 of 1 positions shown · non-contrast
Comparison: 08/21/2010

CLINICAL DATA: Shortness of breath.  Peripheral vascular disease.

PORTABLE CHEST - 1 VIEW

[AP]
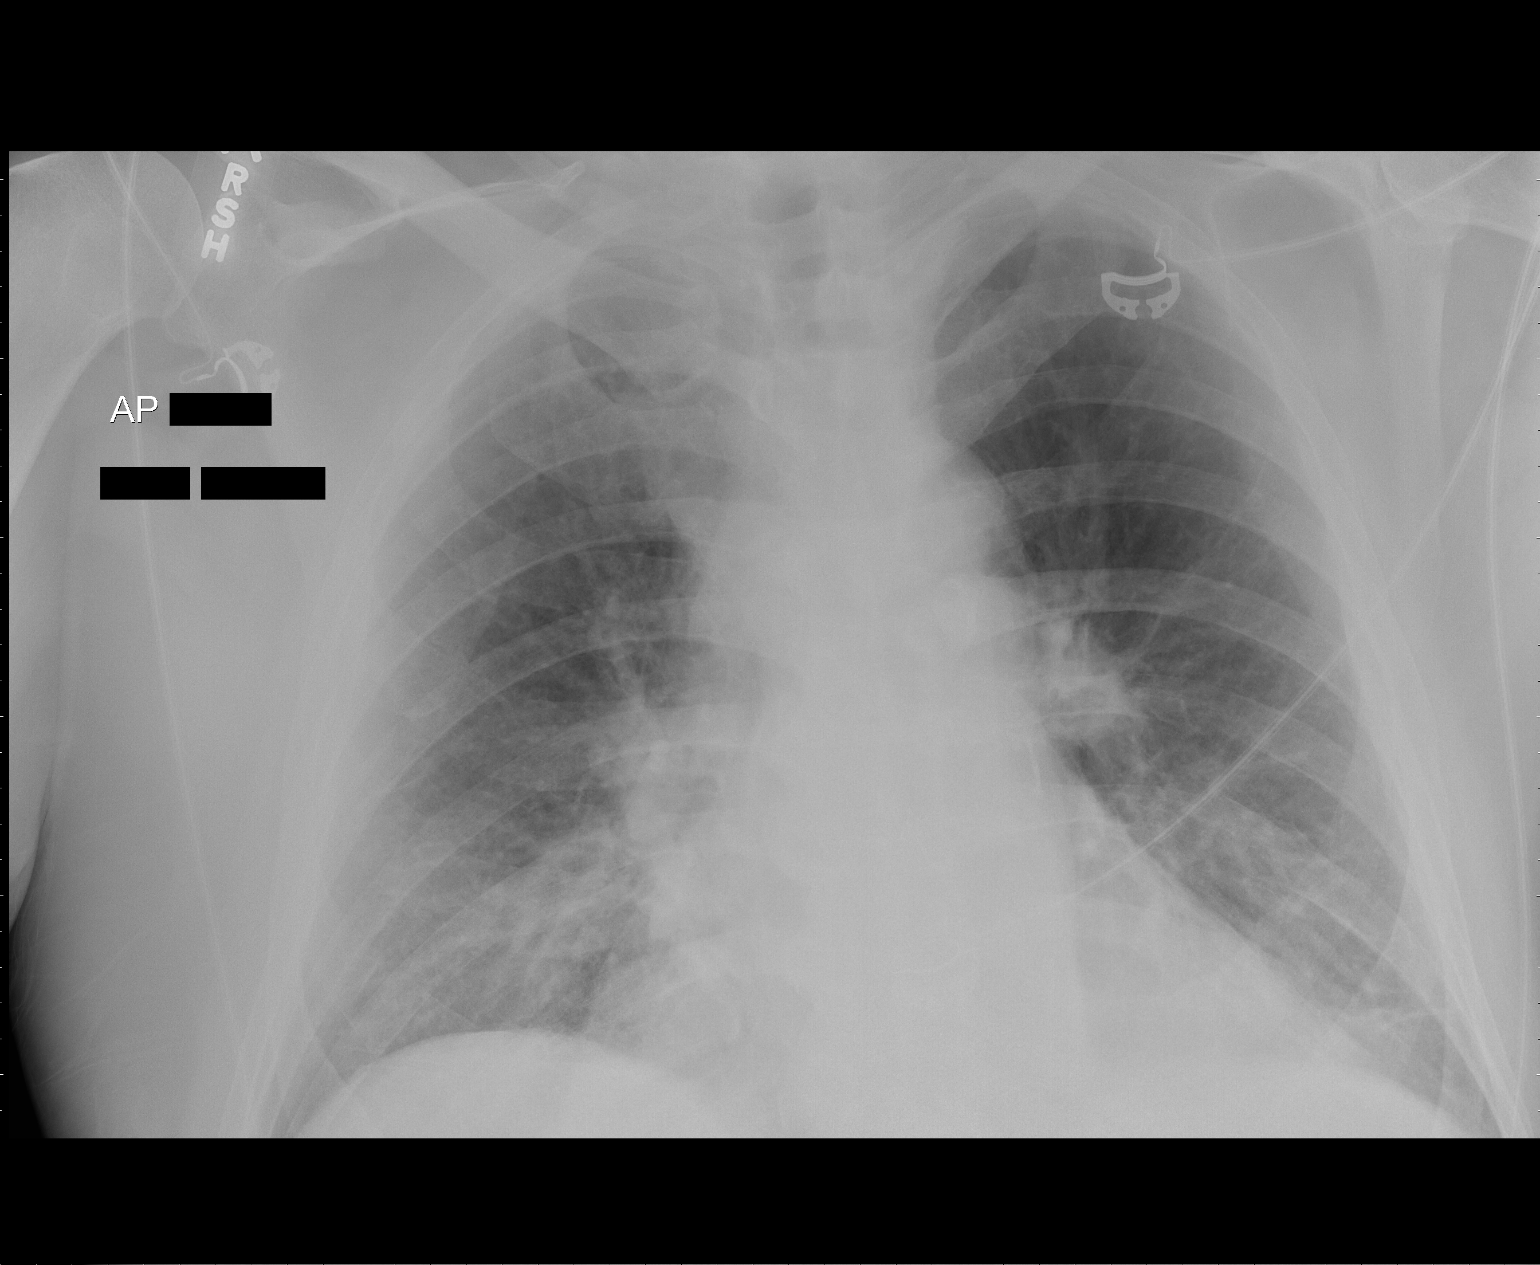

[1 of 1 positions shown; findings below may reference images not displayed]

FINDINGS: Mild bibasilar infiltrates versus atelectasis appears
stable.  No new or worsening areas of pulmonary past year seen.  No
definite pleural effusion.  Heart size is stable.
IMPRESSION: No significant change in mild bibasilar atelectasis versus
infiltrates.

## 2012-07-20 ENCOUNTER — Other Ambulatory Visit: Payer: Self-pay | Admitting: *Deleted

## 2012-07-20 DIAGNOSIS — I739 Peripheral vascular disease, unspecified: Secondary | ICD-10-CM

## 2012-07-20 DIAGNOSIS — R0989 Other specified symptoms and signs involving the circulatory and respiratory systems: Secondary | ICD-10-CM

## 2012-07-26 ENCOUNTER — Encounter: Payer: Self-pay | Admitting: Vascular Surgery

## 2012-07-27 ENCOUNTER — Other Ambulatory Visit: Payer: Self-pay

## 2012-07-27 ENCOUNTER — Other Ambulatory Visit: Payer: Self-pay | Admitting: *Deleted

## 2012-07-27 ENCOUNTER — Ambulatory Visit (INDEPENDENT_AMBULATORY_CARE_PROVIDER_SITE_OTHER): Payer: Medicaid Other | Admitting: Vascular Surgery

## 2012-07-27 ENCOUNTER — Encounter: Payer: Self-pay | Admitting: Vascular Surgery

## 2012-07-27 VITALS — BP 157/76 | HR 112 | Ht 72.0 in | Wt 241.0 lb

## 2012-07-27 DIAGNOSIS — M7989 Other specified soft tissue disorders: Secondary | ICD-10-CM

## 2012-07-27 DIAGNOSIS — I739 Peripheral vascular disease, unspecified: Secondary | ICD-10-CM

## 2012-07-27 NOTE — Progress Notes (Signed)
Vascular and Vein Specialist of [facility]  Patient name: [name] MRN: [id number] DOB: May 28, 1950 Sex: male  REASON FOR VISIT: right leg pain and swelling.  HPI: [name] is a 62 y.o. male with a complicated vascular history the he states he's had a previous aortobifemoral bypass graft although I do not find any operative note of this. According to records and 2003, he said previous right to left fem-fem bypass graft and a left femoropopliteal bypass graft elsewhere. An arteriogram in September 2003 showed occlusion of his left femoropopliteal graft and a proximal right iliac stenosis which was angioplastied. Believe he ultimately had an aortobifemoral bypass graft. Most recently, on 08/19/2010 he had a right femoral to above-knee pop 2 artery bypass graft with a 6 mm PTFE graft. He had chronic thrombus in the saphenous vein was not a candidate for a vein bypass. According to the patient, his symptoms never really improved and it is likely that his right femoropopliteal bypass graft is chronically occluded.  He has been having pain in the right leg really ever since and before his bypass was done. His main complaint recently has been leg swelling. He does have some persistent claudication bilaterally but I do not get any clear-cut history of rest pain. He denies any history of nonhealing ulcers. He denies any previous history of DVT pain is worsened by standing and walking. There are no other aggravating or alleviating factors.  He continues to smoke 1-1/2 packs of cigarettes per day.  Past Medical History  Diagnosis Date  . Depression   . Hypertension   . Hyperlipidemia   . Myocardial infarction 2000  . CHF (congestive heart failure)   . COPD (chronic obstructive pulmonary disease)   . Peripheral vascular disease, unspecified     08/20/10 doppler: increase in right ABI post-op. Left ABI stable. S/P bi-fem bypass surgery  . GERD (gastroesophageal reflux disease)   . Asthma    . Anxiety   . History of DVT of lower extremity   . CAD (coronary artery disease) 05/27/10    Cath: severe single vessell CAD left cx midportion obtuse marginal 2 to 3.  . Shortness of breath   . Pneumonia 04/04/2012  . Arthritis   . OSA on CPAP   . Hyperlipemia   . COPD (chronic obstructive pulmonary disease)   . Alcohol abuse     H/O  . Tobacco abuse    Family History  Problem Relation Age of Onset  . Heart attack Mother   . Cirrhosis Father   . Heart failure Mother   . Heart failure Brother   . Cancer Brother    SOCIAL HISTORY: History  Substance Use Topics  . Smoking status: Current Every Day Smoker -- 1.50 packs/day for 45 years    Types: Cigarettes  . Smokeless tobacco: Never Used     Comment: refuses information  . Alcohol Use: No   Allergies  Allergen Reactions  . Darvocet (Propoxyphene-Acetaminophen) Hives  . Haldol (Haloperidol Decanoate) Hives   Current Outpatient Prescriptions  Medication Sig Dispense Refill  . amLODipine (NORVASC) 10 MG tablet Take 10 mg by mouth daily.      . colchicine 0.6 MG tablet Take 0.6 mg by mouth 2 (two) times daily.      [name] HYDROcodone-acetaminophen (NORCO) 7.5-325 MG per tablet Take 1 tablet by mouth 3 (three) times daily.      . meloxicam (MOBIC) 7.5 MG tablet Take 7.5 mg by mouth 2 (two) times daily.      [name]  omeprazole (PRILOSEC) 20 MG capsule Take 20 mg by mouth 2 (two) times daily.       . pravastatin (PRAVACHOL) 20 MG tablet Take 20 mg by mouth daily.      . cetirizine (ZYRTEC) 10 MG tablet Take 10 mg by mouth daily.      . cilostazol (PLETAL) 100 MG tablet Take 100 mg by mouth daily.      . clopidogrel (PLAVIX) 75 MG tablet Take 75 mg by mouth daily.      . furosemide (LASIX) 20 MG tablet Take 20 mg by mouth daily.      [name] levofloxacin (LEVAQUIN) 750 MG tablet Take 1 tablet (750 mg total) by mouth daily.  5 tablet  0  . lisinopril (PRINIVIL,ZESTRIL) 5 MG tablet Take 5 mg by mouth daily.      . metoprolol (LOPRESSOR) 50 MG  tablet Take 50 mg by mouth 2 (two) times daily.      . mirtazapine (REMERON) 15 MG tablet Take 15 mg by mouth at bedtime.      . paliperidone (INVEGA) 3 MG 24 hr tablet Take 3 mg by mouth 1 day or 1 dose.      . predniSONE (DELTASONE) 10 MG tablet Takes 6 tablets for 1 days, then 5 tablets for 1 days, then 4 tablets for 1 days, then 3 tablets for 1 days, then 2 tabs for 1 days, then 1 tab for 1 days, and then stop.  21 tablet  0  . tiotropium (SPIRIVA) 18 MCG inhalation capsule Place 1 capsule (18 mcg total) into inhaler and inhale daily.  30 capsule  0  . warfarin (COUMADIN) 1 MG tablet Take 5 mg by mouth as directed.       No current facility-administered medications for this visit.   REVIEW OF SYSTEMS: Arly.Keller ] denotes positive finding; [  ] denotes negative finding  CARDIOVASCULAR:  [ ]  chest pain   [ ]  chest pressure   [ ]  palpitations   Arly.Keller ] orthopnea   Arly.Keller ] dyspnea on exertion   Arly.Keller ] claudication   [ ]  rest pain   [ ]  DVT   [ ]  phlebitis PULMONARY:   [ ]  productive cough   Arly.Keller ] asthma   [ ]  wheezing NEUROLOGIC:   [ ]  weakness  [ ]  paresthesias  [ ]  aphasia  [ ]  amaurosis  [ ]  dizziness HEMATOLOGIC:   [ ]  bleeding problems   [ ]  clotting disorders MUSCULOSKELETAL:  [ ]  joint pain   [ ]  joint swelling Arly.Keller ] leg swelling GASTROINTESTINAL: [ ]   blood in stool  [ ]   hematemesis GENITOURINARY:  [ ]   dysuria  [ ]   hematuria PSYCHIATRIC:  Arly.Keller ] history of major depression INTEGUMENTARY:  [ ]  rashes  [ ]  ulcers CONSTITUTIONAL:  [ ]  fever   [ ]  chills  PHYSICAL EXAM: Filed Vitals:   07/27/12 1447  BP: 157/76  Pulse: 112  Height: 6' (1.829 m)  Weight: 241 lb (109.317 kg)  SpO2: 96%   Body mass index is 32.68 kg/(m^2). GENERAL: The patient is a well-nourished male, in no acute distress. The vital signs are documented above. CARDIOVASCULAR: There is a regular rate and rhythm. I do not detect carotid bruits. He has palpable femoral pulses bilaterally. PULMONARY: There is good air exchange  bilaterally without wheezing or rales. ABDOMEN: Soft and non-tender with normal pitched bowel sounds.  MUSCULOSKELETAL: There are no major deformities. NEUROLOGIC: No focal weakness or paresthesias are  detected. SKIN: he has some hyperpigmentation bilaterally and also dependent rubor. PSYCHIATRIC: The patient has a normal affect.  DATA:  I have reviewed his arterial Doppler study from 04/04/2012. This showed an ABI of 33% on the right and 75% on the left. Toe pressure on the right was 20 mm of mercury and toe pressure on the left was 76 mmHg to  I have reviewed his venous duplex scan from 4-14 which showed no evidence of DVT of the right lower extremity.  MEDICAL ISSUES: His main complaint appears to be swelling of the right leg. He had a duplex scan in April which did not show DVT but I have recommended a follow up duplex to rule out DVT. If this is negative then I think that he should be evaluated with CT angiogram of the aorta with bilateral runoff. Certainly this pain can be related to his peripheral vascular disease. However, given his extensive vascular history he may have limited remaining options for entering will bypass. He appears to have good inflow with palpable femoral pulses bilaterally. I'll see him back after his duplex and CT angio are complete. We have also again discussed the importance of tobacco cessation.   Christiane Sistare S [facility] of [place] [facility]: [phone]

## 2012-07-28 ENCOUNTER — Other Ambulatory Visit: Payer: Self-pay | Admitting: *Deleted

## 2012-07-28 ENCOUNTER — Telehealth: Payer: Self-pay | Admitting: Vascular Surgery

## 2012-07-28 DIAGNOSIS — I739 Peripheral vascular disease, unspecified: Secondary | ICD-10-CM

## 2012-07-28 DIAGNOSIS — R600 Localized edema: Secondary | ICD-10-CM

## 2012-07-28 DIAGNOSIS — Z01810 Encounter for preprocedural cardiovascular examination: Secondary | ICD-10-CM

## 2012-07-28 NOTE — Telephone Encounter (Addendum)
Message copied by Rosalyn Charters on Thu Jul 28, 2012 10:44 AM ------      Message from: Chuck Hint      Created: Wed Jul 27, 2012  6:03 PM      Regarding: CT angio and venous duplex       I saw this patient in the office today. I dictated his note longer after he left. I thought that I had sent him to the vascular lab for venous duplex to rule out DVT of the right lower extremity. Perhaps the lab was unable to perform the test today and he is been scheduled have this done in the next few days. He also needs to be scheduled for a CT angiogram of the aorta with bilateral runoff to evaluate his peripheral vascular disease. Once these 2 tests are complete I'll need to see him back in the office.            Also, in looking at the schedule next week, it looks like I can have office hours from 1 PM to 5 PM on Thursday, 08/04/2012 if that is possible and if that would help.            Thanks            CD             ------  notified patient that he is scheduled for a CTA and should see me when he comes into the office on-07-29-12 for the information.

## 2012-08-01 ENCOUNTER — Encounter (INDEPENDENT_AMBULATORY_CARE_PROVIDER_SITE_OTHER): Payer: Medicaid Other | Admitting: *Deleted

## 2012-08-01 DIAGNOSIS — R600 Localized edema: Secondary | ICD-10-CM

## 2012-08-01 DIAGNOSIS — M79609 Pain in unspecified limb: Secondary | ICD-10-CM

## 2012-08-03 ENCOUNTER — Other Ambulatory Visit: Payer: Self-pay | Admitting: Vascular Surgery

## 2012-08-04 ENCOUNTER — Other Ambulatory Visit: Payer: Self-pay

## 2012-08-04 DIAGNOSIS — R609 Edema, unspecified: Secondary | ICD-10-CM

## 2012-08-09 ENCOUNTER — Encounter: Payer: Self-pay | Admitting: Vascular Surgery

## 2012-08-10 ENCOUNTER — Encounter (INDEPENDENT_AMBULATORY_CARE_PROVIDER_SITE_OTHER): Payer: Medicaid Other | Admitting: *Deleted

## 2012-08-10 ENCOUNTER — Ambulatory Visit
Admission: RE | Admit: 2012-08-10 | Discharge: 2012-08-10 | Disposition: A | Discharge status: Home / Self Care | Payer: Medicaid Other | Source: Ambulatory Visit | Attending: Vascular Surgery | Admitting: Vascular Surgery

## 2012-08-10 ENCOUNTER — Encounter: Payer: Self-pay | Admitting: Vascular Surgery

## 2012-08-10 ENCOUNTER — Ambulatory Visit (INDEPENDENT_AMBULATORY_CARE_PROVIDER_SITE_OTHER): Payer: Medicaid Other | Admitting: Vascular Surgery

## 2012-08-10 ENCOUNTER — Ambulatory Visit: Payer: Medicaid Other | Admitting: Vascular Surgery

## 2012-08-10 VITALS — BP 142/78 | HR 86 | Resp 16 | Ht 72.0 in | Wt 248.0 lb

## 2012-08-10 DIAGNOSIS — I739 Peripheral vascular disease, unspecified: Secondary | ICD-10-CM

## 2012-08-10 DIAGNOSIS — M7989 Other specified soft tissue disorders: Secondary | ICD-10-CM | POA: Insufficient documentation

## 2012-08-10 DIAGNOSIS — I80299 Phlebitis and thrombophlebitis of other deep vessels of unspecified lower extremity: Secondary | ICD-10-CM

## 2012-08-10 DIAGNOSIS — M79609 Pain in unspecified limb: Secondary | ICD-10-CM

## 2012-08-10 DIAGNOSIS — Z01810 Encounter for preprocedural cardiovascular examination: Secondary | ICD-10-CM

## 2012-08-10 MED ORDER — IOHEXOL 350 MG/ML SOLN
100.0000 mL | Freq: Once | INTRAVENOUS | Status: AC | PRN
  Administered 2012-08-10: 100 mL via INTRAVENOUS

## 2012-08-10 NOTE — Assessment & Plan Note (Signed)
This patient has significant infrainguinal arterial occlusive disease bilaterally which is more significant on the right side. This may be related to the proximal deep femoral artery occlusion. However there is only reconstitution of distal branches and this does not appear to be an easy fix. In addition, I am reluctant to operate multiple times on the right groin given that he has an aortobifemoral bypass graft in the risk of graft infection. We have had a long discussion about the importance of tobacco cessation. I've given him the number for the tobacco cessation program. He understands it continued tobacco use will significantly increase his risk of limb loss. I on seeing him back in 6 months with follow up ABIs. If his symptoms progress and I think he would need an arteriogram to violate him for revascularization on the right although his options are limited given his multiple previous procedures.

## 2012-08-10 NOTE — Progress Notes (Signed)
Vascular and Vein Specialist of [facility]  Patient name: [name] MRN: [id number] DOB: [birth year] Sex: male  REASON FOR VISIT: follow up of right leg pain and swelling.  HPI: [name] is a 62 y.o. male wide seen in consultation on 07/27/2012. He has a complicated vascular history.  He had an aortobifemoral bypass graft in the late 1990s. He was subsequently lost to follow up. He presented with bilateral lower extremity claudication and underwent attempted endovascular revascularization by Dr. Allyson Sabal. This was not possible technically and therefore he was sent for evaluation for femoropopliteal bypass grafting. On 08/19/2010 he underwent a right femoral to above-knee popliteal artery bypass with a 6 mm PTFE graft. He has also had a left femoropopliteal bypass graft elsewhere. He had chronic thrombus in the saphenous vein was not a candidate for a vein bypass. According to the patient, his symptoms never really improved and it is likely that his right femoropopliteal bypass graft is chronically occluded.  I set him up for a CT angiogram and venous duplex scan he comes in today to discuss these results. He continues to have some swelling in the right lower extremity in addition to his claudication symptoms.  He continues to have claudication in the right lower extremity. There is no significant rest pain and no history nonhealing ulcers. Unfortunately, he continues to smoke.    Past Medical History  Diagnosis Date  . Depression   . Hypertension   . Hyperlipidemia   . Myocardial infarction 2000  . CHF (congestive heart failure)   . COPD (chronic obstructive pulmonary disease)   . Peripheral vascular disease, unspecified     08/20/10 doppler: increase in right ABI post-op. Left ABI stable. S/P bi-fem bypass surgery  . GERD (gastroesophageal reflux disease)   . Asthma   . Anxiety   . History of DVT of lower extremity   . CAD (coronary artery disease) 05/27/10    Cath: severe  single vessell CAD left cx midportion obtuse marginal 2 to 3.  . Shortness of breath   . Pneumonia 04/04/2012  . Arthritis   . OSA on CPAP   . Hyperlipemia   . COPD (chronic obstructive pulmonary disease)   . Alcohol abuse     H/O  . Tobacco abuse    Family History  Problem Relation Age of Onset  . Heart attack Mother   . Cirrhosis Father   . Heart failure Mother   . Heart failure Brother   . Cancer Brother    SOCIAL HISTORY: History  Substance Use Topics  . Smoking status: Current Every Day Smoker -- 1.50 packs/day for 45 years    Types: Cigarettes  . Smokeless tobacco: Never Used     Comment: refuses information  . Alcohol Use: No   Allergies  Allergen Reactions  . Darvocet (Propoxyphene-Acetaminophen) Hives  . Haldol (Haloperidol Decanoate) Hives   Current Outpatient Prescriptions  Medication Sig Dispense Refill  . amLODipine (NORVASC) 10 MG tablet Take 10 mg by mouth daily.      . cetirizine (ZYRTEC) 10 MG tablet Take 10 mg by mouth daily.      . cilostazol (PLETAL) 100 MG tablet Take 100 mg by mouth daily.      . clopidogrel (PLAVIX) 75 MG tablet Take 75 mg by mouth daily.      . colchicine 0.6 MG tablet Take 0.6 mg by mouth 2 (two) times daily.      . furosemide (LASIX) 20 MG tablet Take 20 mg by  mouth daily.      [name] HYDROcodone-acetaminophen (NORCO) 7.5-325 MG per tablet Take 1 tablet by mouth 3 (three) times daily.      [name] levofloxacin (LEVAQUIN) 750 MG tablet Take 1 tablet (750 mg total) by mouth daily.  5 tablet  0  . lisinopril (PRINIVIL,ZESTRIL) 5 MG tablet Take 5 mg by mouth daily.      . meloxicam (MOBIC) 7.5 MG tablet Take 7.5 mg by mouth 2 (two) times daily.      . metoprolol (LOPRESSOR) 50 MG tablet Take 50 mg by mouth 2 (two) times daily.      . mirtazapine (REMERON) 15 MG tablet Take 15 mg by mouth at bedtime.      [name] omeprazole (PRILOSEC) 20 MG capsule Take 20 mg by mouth 2 (two) times daily.       . paliperidone (INVEGA) 3 MG 24 hr tablet Take 3 mg by  mouth 1 day or 1 dose.      . pravastatin (PRAVACHOL) 20 MG tablet Take 20 mg by mouth daily.      . predniSONE (DELTASONE) 10 MG tablet Takes 6 tablets for 1 days, then 5 tablets for 1 days, then 4 tablets for 1 days, then 3 tablets for 1 days, then 2 tabs for 1 days, then 1 tab for 1 days, and then stop.  21 tablet  0  . tiotropium (SPIRIVA) 18 MCG inhalation capsule Place 1 capsule (18 mcg total) into inhaler and inhale daily.  30 capsule  0  . warfarin (COUMADIN) 1 MG tablet Take 5 mg by mouth as directed.       No current facility-administered medications for this visit.   REVIEW OF SYSTEMS: Arly.Keller ] denotes positive finding; [  ] denotes negative finding  CARDIOVASCULAR:  [ ]  chest pain   [ ]  chest pressure   [ ]  palpitations   Arly.Keller ] orthopnea   Arly.Keller ] dyspnea on exertion   Arly.Keller ] claudication   [ ]  rest pain   Arly.Keller ] DVT   [ ]  phlebitis PULMONARY:   [ ]  productive cough   [ ]  asthma   [ ]  wheezing INTEGUMENTARY:  [ ]  rashes  [ ]  ulcers CONSTITUTIONAL:  [ ]  fever   [ ]  chills  PHYSICAL EXAM: Filed Vitals:   08/10/12 1602  BP: 142/78  Pulse: 86  Resp: 16  Height: 6' (1.829 m)  Weight: 248 lb (112.492 kg)  SpO2: 97%   Body mass index is 33.63 kg/(m^2). GENERAL: The patient is a well-nourished male, in no acute distress. The vital signs are documented above. CARDIOVASCULAR: There is a regular rate and rhythm. I do not detect carotid bruits. He has palpable femoral pulses bilaterally. I cannot palpate pedal pulses. The feet are warm. He has mild bilateral lower extremity swelling. PULMONARY: There is good air exchange bilaterally without wheezing or rales. ABDOMEN: Soft and non-tender with normal pitched bowel sounds.  MUSCULOSKELETAL: There are no major deformities or cyanosis. NEUROLOGIC: No focal weakness or paresthesias are detected.  DATA:  I have independently interpreted his venous duplex scan today. He has chronic DVT in the right femoral vein. The right greater saphenous vein is  chronically occluded.  I have also reviewed his CT angiogram from today. His aortobifemoral bypass graft is patent. The right superficial femoral artery and the femoropopliteal bypass graft on the right is occluded. The origin of the deep femoral artery is occluded with reconstitution of distal branches. There is reconstitution of the  popliteal artery with two-vessel runoff on the right. On the left side, the superficial femoral artery is occluded but the deep femoral artery is patent. The femoropopliteal graft on the left is occluded. The above-knee pop 2 artery and the left reconstitution.  MEDICAL ISSUES:  Peripheral vascular disease, unspecified This patient has significant infrainguinal arterial occlusive disease bilaterally which is more significant on the right side. This may be related to the proximal deep femoral artery occlusion. However there is only reconstitution of distal branches and this does not appear to be an easy fix. In addition, I am reluctant to operate multiple times on the right groin given that he has an aortobifemoral bypass graft in the risk of graft infection. We have had a long discussion about the importance of tobacco cessation. I've given him the number for the tobacco cessation program. He understands it continued tobacco use will significantly increase his risk of limb loss. I on seeing him back in 6 months with follow up ABIs. If his symptoms progress and I think he would need an arteriogram to violate him for revascularization on the right although his options are limited given his multiple previous procedures.   Evelyna Folker S [facility] of  [facility]: [phone]

## 2012-08-27 ENCOUNTER — Emergency Department (HOSPITAL_COMMUNITY)
Admission: EM | Admit: 2012-08-27 | Discharge: 2012-08-27 | Disposition: A | Discharge status: Home / Self Care | Payer: Medicaid Other | Attending: Emergency Medicine | Admitting: Emergency Medicine

## 2012-08-27 ENCOUNTER — Other Ambulatory Visit: Payer: Self-pay

## 2012-08-27 ENCOUNTER — Encounter (HOSPITAL_COMMUNITY): Payer: Self-pay | Admitting: Emergency Medicine

## 2012-08-27 ENCOUNTER — Emergency Department (HOSPITAL_COMMUNITY): Payer: Medicaid Other

## 2012-08-27 DIAGNOSIS — F17200 Nicotine dependence, unspecified, uncomplicated: Secondary | ICD-10-CM | POA: Insufficient documentation

## 2012-08-27 DIAGNOSIS — M7989 Other specified soft tissue disorders: Secondary | ICD-10-CM

## 2012-08-27 DIAGNOSIS — F329 Major depressive disorder, single episode, unspecified: Secondary | ICD-10-CM | POA: Insufficient documentation

## 2012-08-27 DIAGNOSIS — Z79899 Other long term (current) drug therapy: Secondary | ICD-10-CM | POA: Insufficient documentation

## 2012-08-27 DIAGNOSIS — F411 Generalized anxiety disorder: Secondary | ICD-10-CM | POA: Insufficient documentation

## 2012-08-27 DIAGNOSIS — F3289 Other specified depressive episodes: Secondary | ICD-10-CM | POA: Insufficient documentation

## 2012-08-27 DIAGNOSIS — Z9981 Dependence on supplemental oxygen: Secondary | ICD-10-CM | POA: Insufficient documentation

## 2012-08-27 DIAGNOSIS — G4733 Obstructive sleep apnea (adult) (pediatric): Secondary | ICD-10-CM | POA: Insufficient documentation

## 2012-08-27 DIAGNOSIS — K219 Gastro-esophageal reflux disease without esophagitis: Secondary | ICD-10-CM | POA: Insufficient documentation

## 2012-08-27 DIAGNOSIS — J45901 Unspecified asthma with (acute) exacerbation: Secondary | ICD-10-CM | POA: Insufficient documentation

## 2012-08-27 DIAGNOSIS — E785 Hyperlipidemia, unspecified: Secondary | ICD-10-CM | POA: Insufficient documentation

## 2012-08-27 DIAGNOSIS — Z8701 Personal history of pneumonia (recurrent): Secondary | ICD-10-CM | POA: Insufficient documentation

## 2012-08-27 DIAGNOSIS — I509 Heart failure, unspecified: Secondary | ICD-10-CM | POA: Insufficient documentation

## 2012-08-27 DIAGNOSIS — Z86718 Personal history of other venous thrombosis and embolism: Secondary | ICD-10-CM | POA: Insufficient documentation

## 2012-08-27 DIAGNOSIS — IMO0002 Reserved for concepts with insufficient information to code with codable children: Secondary | ICD-10-CM | POA: Insufficient documentation

## 2012-08-27 DIAGNOSIS — I10 Essential (primary) hypertension: Secondary | ICD-10-CM | POA: Insufficient documentation

## 2012-08-27 DIAGNOSIS — Z8709 Personal history of other diseases of the respiratory system: Secondary | ICD-10-CM | POA: Insufficient documentation

## 2012-08-27 DIAGNOSIS — Z9889 Other specified postprocedural states: Secondary | ICD-10-CM | POA: Insufficient documentation

## 2012-08-27 DIAGNOSIS — Z7902 Long term (current) use of antithrombotics/antiplatelets: Secondary | ICD-10-CM | POA: Insufficient documentation

## 2012-08-27 DIAGNOSIS — J441 Chronic obstructive pulmonary disease with (acute) exacerbation: Secondary | ICD-10-CM | POA: Insufficient documentation

## 2012-08-27 DIAGNOSIS — I252 Old myocardial infarction: Secondary | ICD-10-CM | POA: Insufficient documentation

## 2012-08-27 DIAGNOSIS — M129 Arthropathy, unspecified: Secondary | ICD-10-CM | POA: Insufficient documentation

## 2012-08-27 DIAGNOSIS — M79609 Pain in unspecified limb: Secondary | ICD-10-CM

## 2012-08-27 DIAGNOSIS — I739 Peripheral vascular disease, unspecified: Secondary | ICD-10-CM | POA: Insufficient documentation

## 2012-08-27 DIAGNOSIS — I2510 Atherosclerotic heart disease of native coronary artery without angina pectoris: Secondary | ICD-10-CM | POA: Insufficient documentation

## 2012-08-27 LAB — POCT I-STAT, CHEM 8
BUN: 11 mg/dL (ref 6–23)
Chloride: 103 mEq/L (ref 96–112)
Creatinine, Ser: 1.2 mg/dL (ref 0.50–1.35)
Potassium: 4.4 mEq/L (ref 3.5–5.1)
Sodium: 141 mEq/L (ref 135–145)

## 2012-08-27 LAB — CBC WITH DIFFERENTIAL/PLATELET
Basophils Absolute: 0 10*3/uL (ref 0.0–0.1)
Basophils Relative: 0 % (ref 0–1)
Hemoglobin: 15.9 g/dL (ref 13.0–17.0)
Lymphocytes Relative: 23 % (ref 12–46)
MCHC: 35.1 g/dL (ref 30.0–36.0)
Monocytes Relative: 9 % (ref 3–12)
Neutro Abs: 6.3 10*3/uL (ref 1.7–7.7)
Neutrophils Relative %: 66 % (ref 43–77)
WBC: 9.4 10*3/uL (ref 4.0–10.5)

## 2012-08-27 LAB — COMPREHENSIVE METABOLIC PANEL
AST: 42 U/L — ABNORMAL HIGH (ref 0–37)
Albumin: 3.7 g/dL (ref 3.5–5.2)
Alkaline Phosphatase: 70 U/L (ref 39–117)
BUN: 11 mg/dL (ref 6–23)
CO2: 22 mEq/L (ref 19–32)
Chloride: 101 mEq/L (ref 96–112)
Potassium: 4.3 mEq/L (ref 3.5–5.1)
Total Bilirubin: 0.2 mg/dL — ABNORMAL LOW (ref 0.3–1.2)

## 2012-08-27 LAB — POCT I-STAT TROPONIN I

## 2012-08-27 MED ORDER — NITROGLYCERIN 0.4 MG SL SUBL
0.4000 mg | SUBLINGUAL_TABLET | SUBLINGUAL | Status: DC | PRN
  Administered 2012-08-27: 0.4 mg via SUBLINGUAL
  Filled 2012-08-27: qty 25

## 2012-08-27 MED ORDER — HYDROMORPHONE HCL PF 1 MG/ML IJ SOLN
1.0000 mg | Freq: Once | INTRAMUSCULAR | Status: AC
  Administered 2012-08-27: 1 mg via INTRAVENOUS
  Filled 2012-08-27: qty 1

## 2012-08-27 MED ORDER — OXYCODONE-ACETAMINOPHEN 5-325 MG PO TABS: 1.0000 | ORAL_TABLET | Freq: Three times a day (TID) | ORAL | Status: DC | PRN

## 2012-08-27 MED ORDER — FUROSEMIDE 10 MG/ML IJ SOLN
40.0000 mg | Freq: Once | INTRAMUSCULAR | Status: AC
  Administered 2012-08-27: 40 mg via INTRAVENOUS
  Filled 2012-08-27: qty 4

## 2012-08-27 MED ORDER — ALBUTEROL SULFATE (5 MG/ML) 0.5% IN NEBU
5.0000 mg | INHALATION_SOLUTION | Freq: Once | RESPIRATORY_TRACT | Status: AC
  Administered 2012-08-27: 5 mg via RESPIRATORY_TRACT
  Filled 2012-08-27: qty 1

## 2012-08-27 MED ORDER — LEVOFLOXACIN 750 MG PO TABS
750.0000 mg | ORAL_TABLET | Freq: Once | ORAL | Status: AC
  Administered 2012-08-27: 750 mg via ORAL
  Filled 2012-08-27: qty 1

## 2012-08-27 MED ORDER — ONDANSETRON HCL 4 MG/2ML IJ SOLN
4.0000 mg | Freq: Once | INTRAMUSCULAR | Status: AC
  Administered 2012-08-27: 4 mg via INTRAVENOUS
  Filled 2012-08-27: qty 2

## 2012-08-27 MED ORDER — IPRATROPIUM BROMIDE 0.02 % IN SOLN
0.5000 mg | Freq: Once | RESPIRATORY_TRACT | Status: AC
  Administered 2012-08-27: 0.5 mg via RESPIRATORY_TRACT
  Filled 2012-08-27: qty 2.5

## 2012-08-27 MED ORDER — IOHEXOL 350 MG/ML SOLN
80.0000 mL | Freq: Once | INTRAVENOUS | Status: AC | PRN
  Administered 2012-08-27: 80 mL via INTRAVENOUS

## 2012-08-27 NOTE — ED Provider Notes (Signed)
History    CSN: [id number] Arrival date & time 08/27/12  7253  First MD Initiated Contact with Patient 08/27/12 [phone]     Chief Complaint  Patient presents with  . Hip Pain  . Foot Swelling   (Consider location/radiation/quality/duration/timing/severity/associated sxs/prior Treatment) HPI Comments: [name] is a 62 y.o. male h/o CHF (EF 60%), COPD here with an acute exacerbation.  Patient states he has chest pain, SOB, and BLE swelling going on for days.  He has been taking hydrocodone for pain relief without any effect.  He has a history of ao-fem bypass as well with some subsequent distal occlusion.  Patient states his bilateral hips hurt radiating down both legs.  He denies fever, chills, night sweats or any systemic symptoms.  His cough is at baseline.  ROS is otherwise negative.   The history is provided by the patient, the EMS personnel and medical records.   Past Medical History  Diagnosis Date  . Depression   . Hypertension   . Hyperlipidemia   . Myocardial infarction 2000  . CHF (congestive heart failure)   . COPD (chronic obstructive pulmonary disease)   . Peripheral vascular disease, unspecified     08/20/10 doppler: increase in right ABI post-op. Left ABI stable. S/P bi-fem bypass surgery  . GERD (gastroesophageal reflux disease)   . Asthma   . Anxiety   . History of DVT of lower extremity   . CAD (coronary artery disease) 05/27/10    Cath: severe single vessell CAD left cx midportion obtuse marginal 2 to 3.  . Shortness of breath   . Pneumonia 04/04/2012  . Arthritis   . OSA on CPAP   . Hyperlipemia   . COPD (chronic obstructive pulmonary disease)   . Alcohol abuse     H/O  . Tobacco abuse    Past Surgical History  Procedure Laterality Date  . Femoral bypass  08/19/10    Right Fem-Pop  . Aortogram w/ ptca  09/292003  . Eye surgery    . Back surgery    . Total knee arthroplasty    . Cholecystectomy    . Cardiac catheterization  05/27/10    severe  CAD left cx  . Abdoninal ao angio & bifem angio  05/27/10    Patent graft, occluded bil stents with no retrograde flow into the hypogastric arteries. 100% occl left ant. tibial artery, 70% to 80% to 100% stenosis right superficial fem artery above adductor canal. 100 % occl right ant. tibial vessell   Family History  Problem Relation Age of Onset  . Heart attack Mother   . Cirrhosis Father   . Heart failure Mother   . Heart failure Brother   . Cancer Brother    History  Substance Use Topics  . Smoking status: Current Every Day Smoker -- 1.50 packs/day for 45 years    Types: Cigarettes  . Smokeless tobacco: Never Used     Comment: refuses information  . Alcohol Use: No    Review of Systems 10 Systems reviewed and are negative for acute change except as noted in the HPI.  Allergies  Darvocet and Haldol  Home Medications   Current Outpatient Rx  Name  Route  Sig  Dispense  Refill  . amLODipine (NORVASC) 10 MG tablet   Oral   Take 10 mg by mouth daily.         . cetirizine (ZYRTEC) 10 MG tablet   Oral   Take 10 mg by mouth  daily.         . cilostazol (PLETAL) 100 MG tablet   Oral   Take 100 mg by mouth daily.         . clopidogrel (PLAVIX) 75 MG tablet   Oral   Take 75 mg by mouth daily.         . colchicine 0.6 MG tablet   Oral   Take 0.6 mg by mouth 2 (two) times daily.         . furosemide (LASIX) 20 MG tablet   Oral   Take 20 mg by mouth daily.         [name] HYDROcodone-acetaminophen (NORCO) 7.5-325 MG per tablet   Oral   Take 1 tablet by mouth 3 (three) times daily.         [name] levofloxacin (LEVAQUIN) 750 MG tablet   Oral   Take 1 tablet (750 mg total) by mouth daily.   5 tablet   0   . lisinopril (PRINIVIL,ZESTRIL) 5 MG tablet   Oral   Take 5 mg by mouth daily.         . meloxicam (MOBIC) 7.5 MG tablet   Oral   Take 7.5 mg by mouth 2 (two) times daily.         . metoprolol (LOPRESSOR) 50 MG tablet   Oral   Take 50 mg by mouth 2  (two) times daily.         . mirtazapine (REMERON) 15 MG tablet   Oral   Take 15 mg by mouth at bedtime.         [name] omeprazole (PRILOSEC) 20 MG capsule   Oral   Take 20 mg by mouth 2 (two) times daily.          . paliperidone (INVEGA) 3 MG 24 hr tablet   Oral   Take 3 mg by mouth 1 day or 1 dose.         . pravastatin (PRAVACHOL) 20 MG tablet   Oral   Take 20 mg by mouth daily.         . predniSONE (DELTASONE) 10 MG tablet      Takes 6 tablets for 1 days, then 5 tablets for 1 days, then 4 tablets for 1 days, then 3 tablets for 1 days, then 2 tabs for 1 days, then 1 tab for 1 days, and then stop.   21 tablet   0   . tiotropium (SPIRIVA) 18 MCG inhalation capsule   Inhalation   Place 1 capsule (18 mcg total) into inhaler and inhale daily.   30 capsule   0   . warfarin (COUMADIN) 1 MG tablet   Oral   Take 5 mg by mouth as directed.          BP 144/72  Temp(Src) 97.6 F (36.4 C) (Oral)  Resp 29  SpO2 94% Physical Exam  Nursing note and vitals reviewed. Constitutional: He is oriented to person, place, and time. Vital signs are normal. He appears well-developed and well-nourished.  Non-toxic appearance. He does not appear ill. He appears distressed.  HENT:  Head: Normocephalic and atraumatic.  Nose: Nose normal.  Mouth/Throat: Oropharynx is clear and moist. No oropharyngeal exudate.  Eyes: Conjunctivae and EOM are normal. Pupils are equal, round, and reactive to light. No scleral icterus.  Neck: Normal range of motion. Neck supple. No tracheal deviation, no edema, no erythema and normal range of motion present. No mass and no thyromegaly present.  Cardiovascular:  Regular rhythm, S1 normal, S2 normal, normal heart sounds, intact distal pulses and normal pulses.  Exam reveals no gallop and no friction rub.   No murmur heard. Pulses:      Radial pulses are 2+ on the right side, and 2+ on the left side.       Dorsalis pedis pulses are 2+ on the right side, and 2+  on the left side.  tachycardia  Pulmonary/Chest: He is in respiratory distress. He has wheezes. He has no rhonchi. He has rales.  Abdominal: Soft. Normal appearance and bowel sounds are normal. He exhibits distension. He exhibits no ascites and no mass. There is no hepatosplenomegaly. There is tenderness. There is no rebound, no guarding and no CVA tenderness.  Musculoskeletal: Normal range of motion. He exhibits edema and tenderness.  2+ pitting edema to BLE up to the patient mid thigh 2+ DP pulses palpated bilaterally, cap refill <3 sec  Lymphadenopathy:    He has no cervical adenopathy.  Neurological: He is alert and oriented to person, place, and time. He has normal strength. No cranial nerve deficit or sensory deficit. GCS eye subscore is 4. GCS verbal subscore is 5. GCS motor subscore is 6.  Skin: Skin is warm, dry and intact. No petechiae and no rash noted. He is not diaphoretic. No erythema. No pallor.  Psychiatric: He has a normal mood and affect. His behavior is normal. Judgment normal.    ED Course  Procedures (including critical care time) Labs Reviewed  COMPREHENSIVE METABOLIC PANEL - Abnormal; Notable for the following:    Glucose, Bld 197 (*)    AST 42 (*)    ALT 56 (*)    Total Bilirubin 0.2 (*)    All other components within normal limits  POCT I-STAT, CHEM 8 - Abnormal; Notable for the following:    Glucose, Bld 198 (*)    All other components within normal limits  CBC WITH DIFFERENTIAL  PRO B NATRIURETIC PEPTIDE  TROPONIN I  PROTIME-INR  POCT I-STAT TROPONIN I   Dg Chest 2 View  08/27/2012   *RADIOLOGY REPORT*  Clinical Data: Hip pain.  CHEST - 2 VIEW  Comparison: 04/21/12  Findings: The heart size is enlarged.  There is no pleural effusion or interstitial edema.  The lung volumes appear lobe.  No airspace consolidation.  Spondylosis is identified within the thoracic spine.  IMPRESSION:  1.  Cardiac enlargement. 2.  Low lung volumes.   Original Report Authenticated  By: Signa Kell, M.D.   Ct Angio Chest Aorta W/cm &/or Wo/cm  08/27/2012   *RADIOLOGY REPORT*  Clinical Data:  Severe pain, evaluate for dissection  CT ANGIOGRAPHY CHEST, ABDOMEN AND PELVIS  Technique:  Multidetector CT imaging through the chest, abdomen and pelvis was performed using the standard protocol during bolus administration of intravenous contrast.  Multiplanar reconstructed images including MIPs were obtained and reviewed to evaluate the vascular anatomy.  Contrast: 80mL OMNIPAQUE IOHEXOL 350 MG/ML SOLN  Comparison:  [facility] CTA abdomen pelvis with runoff dated 08/10/2012.  [place] CTA chest dated 04/04/2012.  CTA CHEST  Findings:  No evidence of thoracic aortic aneurysm or dissection. No evidence of mediastinal hematoma.  Although not specifically tailored for evaluation of the pulmonary arteries, there is no evidence of pulmonary embolism.  Mild dependent atelectasis in the bilateral upper lobes and right lower lobe.  No suspicious pulmonary nodules.  No pleural effusion or pneumothorax.  Visualized thyroid is unremarkable.  The heart is top normal in size  with prominent epicardial fat.  No pericardial effusion. Coronary atherosclerosis.  Atherosclerotic calcifications of the aortic arch.  Eccentric mural thrombus along the proximal descending thoracic aorta (series 6/image 42) and along the origin of the right innominate artery (series 6/image 28).  No suspicious mediastinal, hilar, or axillary lymphadenopathy.  Degenerative changes of the thoracic spine.  Review of the MIP images confirms the above findings.  IMPRESSION: No evidence of thoracic aortic aneurysm or dissection.  No evidence of pulmonary embolism.  No evidence of acute cardiopulmonary disease.  CTA ABDOMEN AND PELVIS  Findings:  No evidence of abdominal aortic aneurysm or dissection.  Eccentric mural thrombus along the upper abdominal aorta.  Aortobi- iliac bypass graft.  Atherosclerotic dislocations of the abdominal  aorta and branch vessels.  Celiac artery and SMA are patent.  IMA is occluded.  Stable appearance at the distal anastomoses of the aortobifemoral graft bilaterally.  Tiny hiatal hernia.  Postsurgical changes related to suspected Nissen fundoplication wrap.  Moderate hepatic steatosis.  Spleen, pancreas, and adrenal glands are within normal limits.  Status post cholecystectomy.  No intrahepatic or extrahepatic ductal dilatation.  Kidneys are within normal limits, noting vascular calcifications. No hydronephrosis.  No evidence of bowel obstruction.  No abdominopelvic ascites.  No suspicious abdominopelvic lymphadenopathy.  Prostate is unremarkable.  Bladder is within normal limits.  Degenerative changes of the lumbar spine.  Review of the MIP images confirms the above findings.  IMPRESSION: No evidence of abdominal aortic aneurysm or dissection.  No interval change from recent CTA abdomen/pelvis with runoff.   Original Report Authenticated By: Charline Bills, M.D.   Ct Cta Abd/pel W/cm &/or W/o Cm  08/27/2012   *RADIOLOGY REPORT*  Clinical Data:  Severe pain, evaluate for dissection  CT ANGIOGRAPHY CHEST, ABDOMEN AND PELVIS  Technique:  Multidetector CT imaging through the chest, abdomen and pelvis was performed using the standard protocol during bolus administration of intravenous contrast.  Multiplanar reconstructed images including MIPs were obtained and reviewed to evaluate the vascular anatomy.  Contrast: 80mL OMNIPAQUE IOHEXOL 350 MG/ML SOLN  Comparison:  [facility] CTA abdomen pelvis with runoff dated 08/10/2012.  [place] CTA chest dated 04/04/2012.  CTA CHEST  Findings:  No evidence of thoracic aortic aneurysm or dissection. No evidence of mediastinal hematoma.  Although not specifically tailored for evaluation of the pulmonary arteries, there is no evidence of pulmonary embolism.  Mild dependent atelectasis in the bilateral upper lobes and right lower lobe.  No suspicious pulmonary nodules.   No pleural effusion or pneumothorax.  Visualized thyroid is unremarkable.  The heart is top normal in size with prominent epicardial fat.  No pericardial effusion. Coronary atherosclerosis.  Atherosclerotic calcifications of the aortic arch.  Eccentric mural thrombus along the proximal descending thoracic aorta (series 6/image 42) and along the origin of the right innominate artery (series 6/image 28).  No suspicious mediastinal, hilar, or axillary lymphadenopathy.  Degenerative changes of the thoracic spine.  Review of the MIP images confirms the above findings.  IMPRESSION: No evidence of thoracic aortic aneurysm or dissection.  No evidence of pulmonary embolism.  No evidence of acute cardiopulmonary disease.  CTA ABDOMEN AND PELVIS  Findings:  No evidence of abdominal aortic aneurysm or dissection.  Eccentric mural thrombus along the upper abdominal aorta.  Aortobi- iliac bypass graft.  Atherosclerotic dislocations of the abdominal aorta and branch vessels.  Celiac artery and SMA are patent.  IMA is occluded.  Stable appearance at the distal anastomoses of the aortobifemoral graft bilaterally.  Tiny hiatal hernia.  Postsurgical changes related to suspected Nissen fundoplication wrap.  Moderate hepatic steatosis.  Spleen, pancreas, and adrenal glands are within normal limits.  Status post cholecystectomy.  No intrahepatic or extrahepatic ductal dilatation.  Kidneys are within normal limits, noting vascular calcifications. No hydronephrosis.  No evidence of bowel obstruction.  No abdominopelvic ascites.  No suspicious abdominopelvic lymphadenopathy.  Prostate is unremarkable.  Bladder is within normal limits.  Degenerative changes of the lumbar spine.  Review of the MIP images confirms the above findings.  IMPRESSION: No evidence of abdominal aortic aneurysm or dissection.  No interval change from recent CTA abdomen/pelvis with runoff.   Original Report Authenticated By: Charline Bills, M.D.   No diagnosis  found.  Date: 08/27/2012  Rate: 101  Rhythm: sinus tachycardia  QRS Axis: normal  Intervals: normal  ST/T Wave abnormalities: normal  Conduction Disutrbances:none  Narrative Interpretation:   Old EKG Reviewed: unchanged   MDM  Patient story is concerning for CHF vs. COPD exacerbation.   We will investigate both.  He has an extensive vascular history as well.  ABI's were 0.54 on the R and 0.84 on the L.  I am unable to find his baseline in his history.  We will consult vascular surgery to get his baseline numbers and further recommendations.  Patient was given dilaudid for pain management, breathing treatments, as well as lasix for his BLE swelling and pain.  1 - Spoke with Dr. Durwin Nora with vascular surgery who will assess the patient at the bedside.  Dr. Durwin Nora believes his legs are well perfused.  Rec for a CT angio for dissection and Doppler for DVT.  Patient has known chronic DVT of his RLE.  Patient pain is now tolerable and he feels comfortable going home.  He was instructed to follow up with his vas surg within the next 1-2 weeks for continued care.  Tomasita Crumble, MD 08/27/12 [phone]

## 2012-08-27 NOTE — Consult Note (Signed)
[facility]  Patient name: [name] MRN: [id number] DOB: [birth year] Sex: male  REASON FOR CONSULT: Bilateral lower extremity pain and right leg swelling. Consult is from the emergency department.  HPI: [name] is a 62 y.o. male who presents to the emergency department today with bilateral lower extremity pain. He has a history of diffuse peripheral vascular disease with continued ongoing tobacco abuse. He has had chronic right lower extremity claudication. He states that today he noted the fairly sudden onset of pain and paresthesias in both lower extremities. He states both lower extremities heard with ambulation. He has also had some chronic bilateral lower extremity swelling. He did have a venous duplex scan in 08/10/12 which showed chronic DVT of the right LE.   I had seen in consultation on 07/27/2012. He has a complicated vascular history. He had an aortobifemoral bypass graft in the late 1990s. He was subsequently lost to follow up. He presented with bilateral lower extremity claudication and underwent attempted endovascular revascularization by Dr. Allyson Sabal. This was not possible technically and therefore he was sent for evaluation for femoropopliteal bypass grafting. On 08/19/2010 he underwent a right femoral to above-knee popliteal artery bypass with a 6 mm PTFE graft. He has also had a left femoropopliteal bypass graft elsewhere. He had chronic thrombus in the saphenous vein was not a candidate for a vein bypass. Both femoropopliteal bypass grafts are chronically occluded.  He had undergone a previous CT scan earlier this year which showed that his aortobifemoral bypass graft was patent. The right superficial femoral artery and the femoropopliteal bypass graft on the right are occluded. The origin of the deep femoral artery is occluded with reconstitution of distal branches. There is reconstitution of the popliteal artery with two-vessel runoff on the  right. On the left side, the superficial femoral artery is occluded but the deep femoral artery is patent. The femoropopliteal graft on the left is occluded. The above-knee pop 2 artery and the left reconstitution.  Past Medical History  Diagnosis Date  . Depression   . Hypertension   . Hyperlipidemia   . Myocardial infarction 2000  . CHF (congestive heart failure)   . COPD (chronic obstructive pulmonary disease)   . Peripheral vascular disease, unspecified     08/20/10 doppler: increase in right ABI post-op. Left ABI stable. S/P bi-fem bypass surgery  . GERD (gastroesophageal reflux disease)   . Asthma   . Anxiety   . History of DVT of lower extremity   . CAD (coronary artery disease) 05/27/10    Cath: severe single vessell CAD left cx midportion obtuse marginal 2 to 3.  . Shortness of breath   . Pneumonia 04/04/2012  . Arthritis   . OSA on CPAP   . Hyperlipemia   . COPD (chronic obstructive pulmonary disease)   . Alcohol abuse     H/O  . Tobacco abuse    Family History  Problem Relation Age of Onset  . Heart attack Mother   . Cirrhosis Father   . Heart failure Mother   . Heart failure Brother   . Cancer Brother    SOCIAL HISTORY: History  Substance Use Topics  . Smoking status: Current Every Day Smoker -- 1.50 packs/day for 45 years    Types: Cigarettes  . Smokeless tobacco: Never Used     Comment: refuses information  . Alcohol Use: No    Allergies  Allergen Reactions  . Darvocet (Propoxyphene-Acetaminophen) Hives  . Haldol (Haloperidol  Decanoate) Hives    Current Facility-Administered Medications  Medication Dose Route Frequency Provider Last Rate Last Dose  . albuterol (PROVENTIL) (5 MG/ML) 0.5% nebulizer solution 5 mg  5 mg Nebulization Once Tomasita Crumble, MD      . nitroGLYCERIN (NITROSTAT) SL tablet 0.4 mg  0.4 mg Sublingual Q5 min PRN Tomasita Crumble, MD   0.4 mg at 08/27/12 0940   Current Outpatient Prescriptions  Medication Sig Dispense Refill  .  amLODipine (NORVASC) 10 MG tablet Take 10 mg by mouth daily.      . colchicine 0.6 MG tablet Take 0.6 mg by mouth 2 (two) times daily.      [name] HYDROcodone-acetaminophen (NORCO) 7.5-325 MG per tablet Take 4 tablets by mouth 3 (three) times daily.       . meloxicam (MOBIC) 7.5 MG tablet Take 7.5 mg by mouth 2 (two) times daily.      [name] omeprazole (PRILOSEC) 20 MG capsule Take 20 mg by mouth daily.       . pravastatin (PRAVACHOL) 20 MG tablet Take 20 mg by mouth daily.      [name] tiotropium (SPIRIVA) 18 MCG inhalation capsule Place 1 capsule (18 mcg total) into inhaler and inhale daily.  30 capsule  0   REVIEW OF SYSTEMS: [name] ] denotes positive finding; [  ] denotes negative finding CARDIOVASCULAR:  [ ]  chest pain   [ ]  chest pressure   [ ]  palpitations   [name] ] orthopnea   [name] ] dyspnea on exertion   [name] ] claudication   [name] ] rest pain   [name] ] DVT   [ ]  phlebitis PULMONARY:   [name] ] productive cough   [ ]  asthma   [ ]  wheezing NEUROLOGIC:   [ ]  weakness  [ ]  paresthesias  [ ]  aphasia  [ ]  amaurosis  [ ]  dizziness HEMATOLOGIC:   [ ]  bleeding problems   [ ]  clotting disorders MUSCULOSKELETAL:  [name] ] joint pain   [ ]  joint swelling [ ]  leg swelling GASTROINTESTINAL: [ ]   blood in stool  [ ]   hematemesis GENITOURINARY:  [ ]   dysuria  [ ]   hematuria PSYCHIATRIC:  [ ]  history of major depression INTEGUMENTARY:  [ ]  rashes  [ ]  ulcers CONSTITUTIONAL:  [ ]  fever   [ ]  chills  PHYSICAL EXAM: Filed Vitals:   08/27/12 0922 08/27/12 0923 08/27/12 0925 08/27/12 0945  BP: 143/85 78/58 121/66 151/81  Pulse:    106  Temp:      TempSrc:      Resp:    18  SpO2:    91%   There is no weight on file to calculate BMI. GENERAL: The patient is a well-nourished male, in no acute distress. The vital signs are documented above. CARDIOVASCULAR: There is a regular rate and rhythm. He has bilateral carotid bruits. He has palpable femoral pulses. He has a monophasic right dorsalis pedis and posterior tibial signal. He has a brisk  left posterior tibial signal and a monophasic dorsalis pedis signal. He has significant bilateral lower extremity swelling which is more significant on the right side appeared PULMONARY: There is good air exchange bilaterally. He has rhonchi and rales bilaterally. ABDOMEN: Soft and non-tender with normal pitched bowel sounds.  MUSCULOSKELETAL: There are no major deformities or cyanosis. NEUROLOGIC: No focal weakness or paresthesias are detected. SKIN: There are no ulcers or rashes noted. PSYCHIATRIC: The patient has a normal affect.  DATA:  Lab Results  Component Value Date  WBC 9.4 08/27/2012   HGB 16.7 08/27/2012   HCT 49.0 08/27/2012   MCV 91.0 08/27/2012   PLT 180 08/27/2012   Lab Results  Component Value Date   NA 141 08/27/2012   K 4.4 08/27/2012   CL 103 08/27/2012   CO2 22 08/27/2012   Lab Results  Component Value Date   CREATININE 1.20 08/27/2012   Lab Results  Component Value Date   INR 0.95 08/27/2012   INR 0.98 08/18/2010   INR 1.20 08/08/2010   Lab Results  Component Value Date   HGBA1C 6.3* 08/19/2010   CT ANGIOGRAM ABDOMEN AND PELVIS: No acute abnormalities.   I HAVE CANCELED THE VENOUS DUPLEX OF HIS RIGHT LOWER EXTREMITY GIVEN THAT HE HAS KNOWN CHRONIC DVT OF THE RIGHT LOWER EXTREMITY BASED ON A DUPLEX FROM 08/10/12 WHICH I FOUND FROM OUR OFFICE.   MEDICAL ISSUES: BILATERAL LOWER EXTREMITY PAIN: The etiology of his bilateral lower extremity pain is not clear. However, based on his exam his aortobifemoral bypass graft is patent. He has known infrainguinal arterial occlusive disease and has reasonable blood flow in both lower extremities. Therefore, I do not think there has been an acute change in his vascular status. He does have chronic lower extremity swelling likely related to his chronic congestive heart failure and right lower extremity DVT which is chronic. I recommended CT angiogram to further evaluate his graft. He could potentially have a deviated disc which is  explaining his symptoms also.   CT unremarkable. He can keep his regularly scheduled apt with [place].  Caya Soberanis S [facility] [place] [facility]: [phone]

## 2012-08-27 NOTE — ED Notes (Signed)
Received pt from home with c/o B/L hip pain that radiates to B/L feet. Pt denies injury, reports history of neuropathy but pain is worse today. Pt has pitting edema to B/L feet unknown how long.

## 2012-08-27 NOTE — ED Provider Notes (Signed)
I saw and evaluated the patient, reviewed the resident's note and I agree with the findings and plan.   Please see my separate note regarding my evaluation of the patient.   I personally interpreted the EKG as well as the resident and agree with the interpretation on the resident's chart.   Vida Roller, MD 08/27/12 1536

## 2012-08-27 NOTE — ED Provider Notes (Signed)
62 year old male presents with multiple complaints including leg pain, some shortness of breath, some coughing and chest pain. On exam the patient has diffuse bilateral lower extremity edema right greater than left but just barely, abdomen is soft, the patient has rales bilaterally, he has rhonchorous breath sounds and is coughing up phlegm with every breath. He has no fever, is able to speak in full sentences when resting, curses like a sailor, and is persistently route to the nursing staff. I placed an IV in his left hand on the dorsal surface with the blood draw but flush, EKG is nonischemic, work up to ensue for congestive heart failure, possible pneumonia.  I saw and evaluated the patient, reviewed the resident's note and I agree with the findings and plan.  I personally interpreted the EKG as well as the resident and agree with the interpretation on the resident's chart.   Vida Roller, MD 08/27/12 0900

## 2012-08-28 ENCOUNTER — Encounter (HOSPITAL_COMMUNITY): Payer: Self-pay | Admitting: *Deleted

## 2012-08-28 ENCOUNTER — Emergency Department (HOSPITAL_COMMUNITY)
Admission: EM | Admit: 2012-08-28 | Discharge: 2012-08-28 | Disposition: A | Discharge status: Home / Self Care | Payer: Medicaid Other | Attending: Emergency Medicine | Admitting: Emergency Medicine

## 2012-08-28 ENCOUNTER — Encounter (HOSPITAL_COMMUNITY): Payer: Self-pay | Admitting: Emergency Medicine

## 2012-08-28 ENCOUNTER — Emergency Department (HOSPITAL_COMMUNITY): Payer: Medicaid Other

## 2012-08-28 DIAGNOSIS — J441 Chronic obstructive pulmonary disease with (acute) exacerbation: Secondary | ICD-10-CM | POA: Insufficient documentation

## 2012-08-28 DIAGNOSIS — M549 Dorsalgia, unspecified: Secondary | ICD-10-CM | POA: Insufficient documentation

## 2012-08-28 DIAGNOSIS — I252 Old myocardial infarction: Secondary | ICD-10-CM | POA: Insufficient documentation

## 2012-08-28 DIAGNOSIS — I10 Essential (primary) hypertension: Secondary | ICD-10-CM | POA: Insufficient documentation

## 2012-08-28 DIAGNOSIS — Z9889 Other specified postprocedural states: Secondary | ICD-10-CM | POA: Insufficient documentation

## 2012-08-28 DIAGNOSIS — R269 Unspecified abnormalities of gait and mobility: Secondary | ICD-10-CM | POA: Insufficient documentation

## 2012-08-28 DIAGNOSIS — I5022 Chronic systolic (congestive) heart failure: Secondary | ICD-10-CM

## 2012-08-28 DIAGNOSIS — R062 Wheezing: Secondary | ICD-10-CM | POA: Insufficient documentation

## 2012-08-28 DIAGNOSIS — F1010 Alcohol abuse, uncomplicated: Secondary | ICD-10-CM | POA: Insufficient documentation

## 2012-08-28 DIAGNOSIS — M129 Arthropathy, unspecified: Secondary | ICD-10-CM | POA: Insufficient documentation

## 2012-08-28 DIAGNOSIS — F17200 Nicotine dependence, unspecified, uncomplicated: Secondary | ICD-10-CM | POA: Insufficient documentation

## 2012-08-28 DIAGNOSIS — Z9981 Dependence on supplemental oxygen: Secondary | ICD-10-CM | POA: Insufficient documentation

## 2012-08-28 DIAGNOSIS — Z79899 Other long term (current) drug therapy: Secondary | ICD-10-CM | POA: Insufficient documentation

## 2012-08-28 DIAGNOSIS — E119 Type 2 diabetes mellitus without complications: Secondary | ICD-10-CM | POA: Insufficient documentation

## 2012-08-28 DIAGNOSIS — R609 Edema, unspecified: Secondary | ICD-10-CM | POA: Insufficient documentation

## 2012-08-28 DIAGNOSIS — F329 Major depressive disorder, single episode, unspecified: Secondary | ICD-10-CM | POA: Insufficient documentation

## 2012-08-28 DIAGNOSIS — Z885 Allergy status to narcotic agent status: Secondary | ICD-10-CM | POA: Insufficient documentation

## 2012-08-28 DIAGNOSIS — Z8701 Personal history of pneumonia (recurrent): Secondary | ICD-10-CM | POA: Insufficient documentation

## 2012-08-28 DIAGNOSIS — F3289 Other specified depressive episodes: Secondary | ICD-10-CM | POA: Insufficient documentation

## 2012-08-28 DIAGNOSIS — R0602 Shortness of breath: Secondary | ICD-10-CM | POA: Insufficient documentation

## 2012-08-28 DIAGNOSIS — R05 Cough: Secondary | ICD-10-CM | POA: Insufficient documentation

## 2012-08-28 DIAGNOSIS — Z791 Long term (current) use of non-steroidal anti-inflammatories (NSAID): Secondary | ICD-10-CM | POA: Insufficient documentation

## 2012-08-28 DIAGNOSIS — Z86718 Personal history of other venous thrombosis and embolism: Secondary | ICD-10-CM | POA: Insufficient documentation

## 2012-08-28 DIAGNOSIS — F411 Generalized anxiety disorder: Secondary | ICD-10-CM | POA: Insufficient documentation

## 2012-08-28 DIAGNOSIS — G4733 Obstructive sleep apnea (adult) (pediatric): Secondary | ICD-10-CM | POA: Insufficient documentation

## 2012-08-28 DIAGNOSIS — J45909 Unspecified asthma, uncomplicated: Secondary | ICD-10-CM | POA: Insufficient documentation

## 2012-08-28 DIAGNOSIS — Z888 Allergy status to other drugs, medicaments and biological substances status: Secondary | ICD-10-CM | POA: Insufficient documentation

## 2012-08-28 DIAGNOSIS — M2550 Pain in unspecified joint: Secondary | ICD-10-CM | POA: Insufficient documentation

## 2012-08-28 DIAGNOSIS — J449 Chronic obstructive pulmonary disease, unspecified: Secondary | ICD-10-CM

## 2012-08-28 DIAGNOSIS — I739 Peripheral vascular disease, unspecified: Secondary | ICD-10-CM | POA: Insufficient documentation

## 2012-08-28 DIAGNOSIS — I509 Heart failure, unspecified: Secondary | ICD-10-CM | POA: Insufficient documentation

## 2012-08-28 DIAGNOSIS — K219 Gastro-esophageal reflux disease without esophagitis: Secondary | ICD-10-CM | POA: Insufficient documentation

## 2012-08-28 DIAGNOSIS — E785 Hyperlipidemia, unspecified: Secondary | ICD-10-CM | POA: Insufficient documentation

## 2012-08-28 DIAGNOSIS — M542 Cervicalgia: Secondary | ICD-10-CM | POA: Insufficient documentation

## 2012-08-28 DIAGNOSIS — I2510 Atherosclerotic heart disease of native coronary artery without angina pectoris: Secondary | ICD-10-CM | POA: Insufficient documentation

## 2012-08-28 DIAGNOSIS — R059 Cough, unspecified: Secondary | ICD-10-CM | POA: Insufficient documentation

## 2012-08-28 DIAGNOSIS — Z8659 Personal history of other mental and behavioral disorders: Secondary | ICD-10-CM | POA: Insufficient documentation

## 2012-08-28 DIAGNOSIS — R600 Localized edema: Secondary | ICD-10-CM

## 2012-08-28 LAB — CBC WITH DIFFERENTIAL/PLATELET
Eosinophils Relative: 1 % (ref 0–5)
HCT: 47.4 % (ref 39.0–52.0)
Hemoglobin: 16.2 g/dL (ref 13.0–17.0)
Lymphocytes Relative: 21 % (ref 12–46)
Lymphs Abs: 1.8 10*3/uL (ref 0.7–4.0)
MCV: 91.3 fL (ref 78.0–100.0)
Platelets: 166 10*3/uL (ref 150–400)
RBC: 5.19 MIL/uL (ref 4.22–5.81)
WBC: 8.8 10*3/uL (ref 4.0–10.5)

## 2012-08-28 LAB — COMPREHENSIVE METABOLIC PANEL
ALT: 58 U/L — ABNORMAL HIGH (ref 0–53)
Alkaline Phosphatase: 75 U/L (ref 39–117)
CO2: 31 mEq/L (ref 19–32)
Calcium: 11.4 mg/dL — ABNORMAL HIGH (ref 8.4–10.5)
GFR calc Af Amer: >90 mL/min (ref >90)
GFR calc non Af Amer: >90 mL/min (ref >90)
Glucose, Bld: 132 mg/dL — ABNORMAL HIGH (ref 70–99)
Potassium: 4.4 mEq/L (ref 3.5–5.1)
Sodium: 142 mEq/L (ref 135–145)

## 2012-08-28 MED ORDER — FUROSEMIDE 40 MG PO TABS: 40.0000 mg | ORAL_TABLET | Freq: Every day | ORAL | Status: DC

## 2012-08-28 MED ORDER — HYDROMORPHONE HCL PF 1 MG/ML IJ SOLN
1.0000 mg | Freq: Once | INTRAMUSCULAR | Status: AC
  Administered 2012-08-28: 1 mg via INTRAVENOUS
  Filled 2012-08-28: qty 1

## 2012-08-28 MED ORDER — FUROSEMIDE 10 MG/ML IJ SOLN
20.0000 mg | Freq: Once | INTRAMUSCULAR | Status: AC
  Administered 2012-08-28: 20 mg via INTRAVENOUS
  Filled 2012-08-28: qty 2

## 2012-08-28 MED ORDER — ALBUTEROL SULFATE HFA 108 (90 BASE) MCG/ACT IN AERS
2.0000 | INHALATION_SPRAY | Freq: Once | RESPIRATORY_TRACT | Status: AC
  Administered 2012-08-28: 2 via RESPIRATORY_TRACT
  Filled 2012-08-28: qty 6.7

## 2012-08-28 MED ORDER — ALBUTEROL SULFATE (5 MG/ML) 0.5% IN NEBU
5.0000 mg | INHALATION_SOLUTION | Freq: Once | RESPIRATORY_TRACT | Status: AC
  Administered 2012-08-28: 5 mg via RESPIRATORY_TRACT
  Filled 2012-08-28: qty 1

## 2012-08-28 MED ORDER — IPRATROPIUM BROMIDE 0.02 % IN SOLN
0.5000 mg | Freq: Once | RESPIRATORY_TRACT | Status: AC
  Administered 2012-08-28: 0.5 mg via RESPIRATORY_TRACT
  Filled 2012-08-28: qty 2.5

## 2012-08-28 MED ORDER — MORPHINE SULFATE 4 MG/ML IJ SOLN
4.0000 mg | Freq: Once | INTRAMUSCULAR | Status: AC
  Administered 2012-08-28: 4 mg via INTRAVENOUS
  Filled 2012-08-28: qty 1

## 2012-08-28 MED ORDER — DEXAMETHASONE SODIUM PHOSPHATE 10 MG/ML IJ SOLN
10.0000 mg | Freq: Once | INTRAMUSCULAR | Status: AC
  Administered 2012-08-28: 10 mg via INTRAVENOUS
  Filled 2012-08-28: qty 1

## 2012-08-28 MED ORDER — OXYCODONE-ACETAMINOPHEN 5-325 MG PO TABS: 2.0000 | ORAL_TABLET | Freq: Four times a day (QID) | ORAL | Status: DC | PRN

## 2012-08-28 NOTE — ED Notes (Signed)
Patient transported to X-ray 

## 2012-08-28 NOTE — ED Notes (Signed)
Patient brought in via [facility] with c/o back pain times 1 week, Patient was seen here yesterday for the same. ETOH on board, Patient complains of lower back pain and rates it a 10/10. He also complains of a congested cough with yellow sputum for more than 1 week. Bilateral edema noted to the lower extremities.

## 2012-08-28 NOTE — ED Provider Notes (Signed)
History    CSN: [id number] Arrival date & time 08/28/12  4098  First MD Initiated Contact with Patient 08/28/12 [phone]     Chief Complaint  Patient presents with  . Back Pain   (Consider location/radiation/quality/duration/timing/severity/associated sxs/prior Treatment) HPI Comments: Patient is a 62 year old male with history of COPD who presents today with shortness of breath, back pain, leg swelling. He was seen in the emergency department yesterday with the same symptoms and states "they ain't did nothing for me". He states his symptoms worsened 4 days ago. He reports shortness of breath, cough, bilateral leg edema, and low back pain. He has been compliant with his medications, but does state he gets confused about which medications to take. He describes the pain in his legs as throbbing, worse with standing and walking. Nothing has made his pain better. Shortness of breath is worse with laying down. He has a productive cough. He is a smoker. He drinks liquor daily.   The history is provided by the patient. No language interpreter was used.   Past Medical History  Diagnosis Date  . Depression   . Hypertension   . Hyperlipidemia   . Myocardial infarction 2000  . CHF (congestive heart failure)   . COPD (chronic obstructive pulmonary disease)   . Peripheral vascular disease, unspecified     08/20/10 doppler: increase in right ABI post-op. Left ABI stable. S/P bi-fem bypass surgery  . GERD (gastroesophageal reflux disease)   . Asthma   . Anxiety   . History of DVT of lower extremity   . CAD (coronary artery disease) 05/27/10    Cath: severe single vessell CAD left cx midportion obtuse marginal 2 to 3.  . Shortness of breath   . Pneumonia 04/04/2012  . Arthritis   . OSA on CPAP   . Hyperlipemia   . COPD (chronic obstructive pulmonary disease)   . Alcohol abuse     H/O  . Tobacco abuse    Past Surgical History  Procedure Laterality Date  . Femoral bypass  08/19/10    Right  Fem-Pop  . Aortogram w/ ptca  09/292003  . Eye surgery    . Back surgery    . Total knee arthroplasty    . Cholecystectomy    . Cardiac catheterization  05/27/10    severe CAD left cx  . Abdoninal ao angio & bifem angio  05/27/10    Patent graft, occluded bil stents with no retrograde flow into the hypogastric arteries. 100% occl left ant. tibial artery, 70% to 80% to 100% stenosis right superficial fem artery above adductor canal. 100 % occl right ant. tibial vessell   Family History  Problem Relation Age of Onset  . Heart attack Mother   . Cirrhosis Father   . Heart failure Mother   . Heart failure Brother   . Cancer Brother    History  Substance Use Topics  . Smoking status: Current Every Day Smoker -- 1.50 packs/day for 45 years    Types: Cigarettes  . Smokeless tobacco: Never Used     Comment: refuses information  . Alcohol Use: Yes    Review of Systems  Constitutional: Negative for fever.  Respiratory: Positive for cough, shortness of breath and wheezing.   Cardiovascular: Positive for leg swelling. Negative for chest pain.  Gastrointestinal: Negative for vomiting and abdominal pain.  Musculoskeletal: Positive for myalgias, joint swelling, arthralgias and gait problem (pain).  All other systems reviewed and are negative.    Allergies  Darvocet and Haldol  Home Medications   Current Outpatient Rx  Name  Route  Sig  Dispense  Refill  . amLODipine (NORVASC) 10 MG tablet   Oral   Take 10 mg by mouth daily.         . colchicine 0.6 MG tablet   Oral   Take 0.6 mg by mouth 2 (two) times daily.         [name] HYDROcodone-acetaminophen (NORCO) 7.5-325 MG per tablet   Oral   Take 4 tablets by mouth 3 (three) times daily.          . meloxicam (MOBIC) 7.5 MG tablet   Oral   Take 7.5 mg by mouth 2 (two) times daily.         [name] omeprazole (PRILOSEC) 20 MG capsule   Oral   Take 20 mg by mouth daily.          [name] oxyCODONE-acetaminophen (PERCOCET/ROXICET) 5-325  MG per tablet   Oral   Take 1 tablet by mouth every 8 (eight) hours as needed for pain.   10 tablet   0   . tiotropium (SPIRIVA) 18 MCG inhalation capsule   Inhalation   Place 1 capsule (18 mcg total) into inhaler and inhale daily.   30 capsule   0   . pravastatin (PRAVACHOL) 20 MG tablet   Oral   Take 20 mg by mouth daily.          BP 130/68  Pulse 92  Temp(Src) 98.1 F (36.7 C) (Oral)  Resp 22  SpO2 96% Physical Exam  Nursing note and vitals reviewed. Constitutional: He is oriented to person, place, and time. He appears well-developed and well-nourished. No distress.  HENT:  Head: Normocephalic and atraumatic.  Right Ear: External ear normal.  Left Ear: External ear normal.  Nose: Nose normal.  Eyes: Conjunctivae are normal.  Neck: Normal range of motion. No tracheal deviation present.  Cardiovascular: Normal rate, regular rhythm and normal heart sounds.   Pulmonary/Chest: Effort normal. No stridor. He has rhonchi (on expiration) in the right upper field, the right middle field, the right lower field, the left upper field, the left middle field and the left lower field.  Abdominal: Soft. He exhibits no distension. There is no tenderness.  Musculoskeletal: Normal range of motion.  2+ pitting edema bilaterally in lower extremities TTP diffusely over lower leg bilaterally Distal pulses intact  Neurological: He is alert and oriented to person, place, and time.  Skin: Skin is warm and dry. He is not diaphoretic.  Psychiatric: He has a normal mood and affect. His behavior is normal.    ED Course  Procedures (including critical care time) Labs Reviewed - No data to display Dg Chest 2 View  08/27/2012   *RADIOLOGY REPORT*  Clinical Data: Hip pain.  CHEST - 2 VIEW  Comparison: 04/21/12  Findings: The heart size is enlarged.  There is no pleural effusion or interstitial edema.  The lung volumes appear lobe.  No airspace consolidation.  Spondylosis is identified within the  thoracic spine.  IMPRESSION:  1.  Cardiac enlargement. 2.  Low lung volumes.   Original Report Authenticated By: Signa Kell, M.D.   Ct Angio Chest Aorta W/cm &/or Wo/cm  08/27/2012   *RADIOLOGY REPORT*  Clinical Data:  Severe pain, evaluate for dissection  CT ANGIOGRAPHY CHEST, ABDOMEN AND PELVIS  Technique:  Multidetector CT imaging through the chest, abdomen and pelvis was performed using the standard protocol during bolus administration of intravenous  contrast.  Multiplanar reconstructed images including MIPs were obtained and reviewed to evaluate the vascular anatomy.  Contrast: 80mL OMNIPAQUE IOHEXOL 350 MG/ML SOLN  Comparison:  [facility] CTA abdomen pelvis with runoff dated 08/10/2012.  [facility] CTA chest dated 04/04/2012.  CTA CHEST  Findings:  No evidence of thoracic aortic aneurysm or dissection. No evidence of mediastinal hematoma.  Although not specifically tailored for evaluation of the pulmonary arteries, there is no evidence of pulmonary embolism.  Mild dependent atelectasis in the bilateral upper lobes and right lower lobe.  No suspicious pulmonary nodules.  No pleural effusion or pneumothorax.  Visualized thyroid is unremarkable.  The heart is top normal in size with prominent epicardial fat.  No pericardial effusion. Coronary atherosclerosis.  Atherosclerotic calcifications of the aortic arch.  Eccentric mural thrombus along the proximal descending thoracic aorta (series 6/image 42) and along the origin of the right innominate artery (series 6/image 28).  No suspicious mediastinal, hilar, or axillary lymphadenopathy.  Degenerative changes of the thoracic spine.  Review of the MIP images confirms the above findings.  IMPRESSION: No evidence of thoracic aortic aneurysm or dissection.  No evidence of pulmonary embolism.  No evidence of acute cardiopulmonary disease.  CTA ABDOMEN AND PELVIS  Findings:  No evidence of abdominal aortic aneurysm or dissection.  Eccentric mural thrombus  along the upper abdominal aorta.  Aortobi- iliac bypass graft.  Atherosclerotic dislocations of the abdominal aorta and branch vessels.  Celiac artery and SMA are patent.  IMA is occluded.  Stable appearance at the distal anastomoses of the aortobifemoral graft bilaterally.  Tiny hiatal hernia.  Postsurgical changes related to suspected Nissen fundoplication wrap.  Moderate hepatic steatosis.  Spleen, pancreas, and adrenal glands are within normal limits.  Status post cholecystectomy.  No intrahepatic or extrahepatic ductal dilatation.  Kidneys are within normal limits, noting vascular calcifications. No hydronephrosis.  No evidence of bowel obstruction.  No abdominopelvic ascites.  No suspicious abdominopelvic lymphadenopathy.  Prostate is unremarkable.  Bladder is within normal limits.  Degenerative changes of the lumbar spine.  Review of the MIP images confirms the above findings.  IMPRESSION: No evidence of abdominal aortic aneurysm or dissection.  No interval change from recent CTA abdomen/pelvis with runoff.   Original Report Authenticated By: Charline Bills, M.D.   Ct Cta Abd/pel W/cm &/or W/o Cm  08/27/2012   *RADIOLOGY REPORT*  Clinical Data:  Severe pain, evaluate for dissection  CT ANGIOGRAPHY CHEST, ABDOMEN AND PELVIS  Technique:  Multidetector CT imaging through the chest, abdomen and pelvis was performed using the standard protocol during bolus administration of intravenous contrast.  Multiplanar reconstructed images including MIPs were obtained and reviewed to evaluate the vascular anatomy.  Contrast: 80mL OMNIPAQUE IOHEXOL 350 MG/ML SOLN  Comparison:  [facility] CTA abdomen pelvis with runoff dated 08/10/2012.  [facility] CTA chest dated 04/04/2012.  CTA CHEST  Findings:  No evidence of thoracic aortic aneurysm or dissection. No evidence of mediastinal hematoma.  Although not specifically tailored for evaluation of the pulmonary arteries, there is no evidence of pulmonary embolism.  Mild  dependent atelectasis in the bilateral upper lobes and right lower lobe.  No suspicious pulmonary nodules.  No pleural effusion or pneumothorax.  Visualized thyroid is unremarkable.  The heart is top normal in size with prominent epicardial fat.  No pericardial effusion. Coronary atherosclerosis.  Atherosclerotic calcifications of the aortic arch.  Eccentric mural thrombus along the proximal descending thoracic aorta (series 6/image 42) and along the origin of the right  innominate artery (series 6/image 28).  No suspicious mediastinal, hilar, or axillary lymphadenopathy.  Degenerative changes of the thoracic spine.  Review of the MIP images confirms the above findings.  IMPRESSION: No evidence of thoracic aortic aneurysm or dissection.  No evidence of pulmonary embolism.  No evidence of acute cardiopulmonary disease.  CTA ABDOMEN AND PELVIS  Findings:  No evidence of abdominal aortic aneurysm or dissection.  Eccentric mural thrombus along the upper abdominal aorta.  Aortobi- iliac bypass graft.  Atherosclerotic dislocations of the abdominal aorta and branch vessels.  Celiac artery and SMA are patent.  IMA is occluded.  Stable appearance at the distal anastomoses of the aortobifemoral graft bilaterally.  Tiny hiatal hernia.  Postsurgical changes related to suspected Nissen fundoplication wrap.  Moderate hepatic steatosis.  Spleen, pancreas, and adrenal glands are within normal limits.  Status post cholecystectomy.  No intrahepatic or extrahepatic ductal dilatation.  Kidneys are within normal limits, noting vascular calcifications. No hydronephrosis.  No evidence of bowel obstruction.  No abdominopelvic ascites.  No suspicious abdominopelvic lymphadenopathy.  Prostate is unremarkable.  Bladder is within normal limits.  Degenerative changes of the lumbar spine.  Review of the MIP images confirms the above findings.  IMPRESSION: No evidence of abdominal aortic aneurysm or dissection.  No interval change from recent CTA  abdomen/pelvis with runoff.   Original Report Authenticated By: Charline Bills, M.D.   12:34 PM Patient reevaluated. Still complaints of pain and shortness of breath. Laying the bed talking on the phone in no distress. Speaking in full sentences.    1. Peripheral edema   2. COPD (chronic obstructive pulmonary disease)   3. CHF (congestive heart failure), chronic, systolic     MDM  Patient presents with leg edema and shortness of breath. Evaluated for same yesterday. Yesterday CTA chest and abd/pelvis was done and no interval change from recent CTA was found. Vascular surgery evaluated patient yesterday and felt as though his legs were perfusing well. See listed below for meds given in ED. Patient evaluated just prior to discharge and states his pain and breathing are significantly improved. He feels well and is ready to go home. He will follow up with his PCP. He also has an appointment with pain management in 2 weeks. Discussed case with Dr. Freida Busman who agrees with plan. Return instructions given. Vital signs for discharge. Patient / Family / Caregiver informed of clinical course, understand medical decision-making process, and agree with plan.   Medications  albuterol (PROVENTIL) (5 MG/ML) 0.5% nebulizer solution 5 mg (5 mg Nebulization Given 08/28/12 1029)  ipratropium (ATROVENT) nebulizer solution 0.5 mg (0.5 mg Nebulization Given 08/28/12 1029)  furosemide (LASIX) injection 20 mg (20 mg Intravenous Given 08/28/12 1150)  morphine 4 MG/ML injection 4 mg (4 mg Intravenous Given 08/28/12 1150)  HYDROmorphone (DILAUDID) injection 1 mg (1 mg Intravenous Given 08/28/12 1312)  albuterol (PROVENTIL) (5 MG/ML) 0.5% nebulizer solution 5 mg (5 mg Nebulization Given 08/28/12 1251)  dexamethasone (DECADRON) injection 10 mg (10 mg Intravenous Given 08/28/12 1333)  furosemide (LASIX) injection 20 mg (20 mg Intravenous Given 08/28/12 1333)  albuterol (PROVENTIL HFA;VENTOLIN HFA) 108 (90 BASE) MCG/ACT inhaler 2  puff (2 puffs Inhalation Given 08/28/12 1418)     Mora Bellman, PA-C 08/28/12 1558

## 2012-08-28 NOTE — Progress Notes (Signed)
Second nebulizer treatment given. Pt appears to be in no obvious distress at this time, and wheezing has improved. Wheezing mostly heard on L side, pt sounds somewhat congested as well and does have productive cough. Pt tolerating treatments at this time. Pt also states he has hx of COPD, emphysema and bronchitis. Lung sounds could be pt's baseline. RT will continue to monitor.

## 2012-08-28 NOTE — ED Notes (Signed)
Arrived via EMS from home. Pt states that his feet lower legs have been swelling the last 3 days, with SOB. Has prescription for Lasix, and oxycodone that he just got filled today. EMS VS BP 140/80, HR 84, RR 18 Rhonchi rt side

## 2012-08-29 ENCOUNTER — Emergency Department (HOSPITAL_COMMUNITY)
Admission: EM | Admit: 2012-08-29 | Discharge: 2012-08-29 | Discharge status: Against Medical Advice | Payer: Medicaid Other | Source: Home / Self Care | Attending: Emergency Medicine | Admitting: Emergency Medicine

## 2012-08-29 DIAGNOSIS — I10 Essential (primary) hypertension: Secondary | ICD-10-CM

## 2012-08-29 DIAGNOSIS — I509 Heart failure, unspecified: Secondary | ICD-10-CM

## 2012-08-29 DIAGNOSIS — E119 Type 2 diabetes mellitus without complications: Secondary | ICD-10-CM

## 2012-08-29 DIAGNOSIS — E785 Hyperlipidemia, unspecified: Secondary | ICD-10-CM

## 2012-08-29 DIAGNOSIS — R609 Edema, unspecified: Secondary | ICD-10-CM

## 2012-08-29 DIAGNOSIS — J449 Chronic obstructive pulmonary disease, unspecified: Secondary | ICD-10-CM

## 2012-08-29 DIAGNOSIS — R0602 Shortness of breath: Secondary | ICD-10-CM

## 2012-08-29 MED ORDER — ALBUTEROL SULFATE (5 MG/ML) 0.5% IN NEBU
5.0000 mg | INHALATION_SOLUTION | Freq: Once | RESPIRATORY_TRACT | Status: DC
  Filled 2012-08-29: qty 1

## 2012-08-29 MED ORDER — IPRATROPIUM BROMIDE 0.02 % IN SOLN
0.5000 mg | Freq: Once | RESPIRATORY_TRACT | Status: DC
  Filled 2012-08-29: qty 2.5

## 2012-08-29 NOTE — ED Provider Notes (Signed)
Medical screening examination/treatment/procedure(s) were performed by non-physician practitioner and as supervising physician I was immediately available for consultation/collaboration.  Jarae Nemmers M Tsion Inghram, MD 08/29/12 1958 

## 2012-08-29 NOTE — ED Provider Notes (Signed)
History    CSN: [id number] Arrival date & time 08/28/12  2210  First MD Initiated Contact with Patient 08/29/12 0110     Chief Complaint  Patient presents with  . Shortness of Breath   (Consider location/radiation/quality/duration/timing/severity/associated sxs/prior Treatment) The history is provided by the patient and a friend. No language interpreter was used.  [name] is a 62 y/o M with PMHx of DM, HTN, HLD, COPD, GERD, Asthma, CAD, alcohol abuse, presenting to the ED, with friend - brought in by EMS, with shortness of breath, cough, and bilateral leg swelling. Patient reported that he has been having shortness of breath for the past 20 years - smoke 2 ppd. Stated that he has been having a cough - dry - that has been ongoing for a long time. Reported that he has had some intermittent chest pain, described as a substernal burning sensation that occurs when he eats and lays down - reported that he takes prilosec that controls it - denied chest pain today. Patient concerned due to bilateral leg swelling - stated that the right is worse than the left - noted the the left leg swelling started a couple of month ago,while the right leg swelling starting 3 years ago. Stated that he was seen in the hospital yesterday afternoon, 08/28/2012, stated that he was discharged with Lasix and pain medications - reported that he filled the prescription after being discharged and took one dose of Lasix as prescribed - stated that nothing has happened to his leg swelling - stated that his legs are still swollen. Patient upset because he was discharged with 3 days of pain medications. Denied abdominal pain, diarrhea, weakness, fatigue. PCP Dr. Bruna Potter Cardiologist Dr. Edilia Bo  Patient reported appointment with pain management on 09/05/2012 due to chronic back pain.    Past Medical History  Diagnosis Date  . Depression   . Hypertension   . Hyperlipidemia   . Myocardial infarction 2000  . CHF  (congestive heart failure)   . COPD (chronic obstructive pulmonary disease)   . Peripheral vascular disease, unspecified     08/20/10 doppler: increase in right ABI post-op. Left ABI stable. S/P bi-fem bypass surgery  . GERD (gastroesophageal reflux disease)   . Asthma   . Anxiety   . History of DVT of lower extremity   . CAD (coronary artery disease) 05/27/10    Cath: severe single vessell CAD left cx midportion obtuse marginal 2 to 3.  . Shortness of breath   . Pneumonia 04/04/2012  . Arthritis   . OSA on CPAP   . Hyperlipemia   . COPD (chronic obstructive pulmonary disease)   . Alcohol abuse     H/O  . Tobacco abuse    Past Surgical History  Procedure Laterality Date  . Femoral bypass  08/19/10    Right Fem-Pop  . Aortogram w/ ptca  09/292003  . Eye surgery    . Back surgery    . Total knee arthroplasty    . Cholecystectomy    . Cardiac catheterization  05/27/10    severe CAD left cx  . Abdoninal ao angio & bifem angio  05/27/10    Patent graft, occluded bil stents with no retrograde flow into the hypogastric arteries. 100% occl left ant. tibial artery, 70% to 80% to 100% stenosis right superficial fem artery above adductor canal. 100 % occl right ant. tibial vessell   Family History  Problem Relation Age of Onset  . Heart attack Mother   .  Cirrhosis Father   . Heart failure Mother   . Heart failure Brother   . Cancer Brother    History  Substance Use Topics  . Smoking status: Current Every Day Smoker -- 1.50 packs/day for 45 years    Types: Cigarettes  . Smokeless tobacco: Never Used     Comment: refuses information  . Alcohol Use: Yes    Review of Systems  Constitutional: Negative for fever, chills and fatigue.  HENT: Positive for neck pain.   Eyes: Negative for visual disturbance.  Respiratory: Positive for cough, shortness of breath and wheezing. Negative for chest tightness.   Cardiovascular: Positive for leg swelling.  Gastrointestinal: Negative for  abdominal pain.  Musculoskeletal: Positive for back pain and arthralgias.  Neurological: Negative for dizziness, weakness, light-headedness and headaches.  All other systems reviewed and are negative.    Allergies  Darvocet and Haldol  Home Medications   Current Outpatient Rx  Name  Route  Sig  Dispense  Refill  . albuterol (PROVENTIL HFA;VENTOLIN HFA) 108 (90 BASE) MCG/ACT inhaler   Inhalation   Inhale 2 puffs into the lungs every 6 (six) hours as needed for wheezing or shortness of breath.         [name] amLODipine (NORVASC) 10 MG tablet   Oral   Take 10 mg by mouth daily.         . colchicine 0.6 MG tablet   Oral   Take 0.6 mg by mouth 2 (two) times daily.         . furosemide (LASIX) 40 MG tablet   Oral   Take 1 tablet (40 mg total) by mouth daily.   30 tablet   0   . meloxicam (MOBIC) 7.5 MG tablet   Oral   Take 7.5 mg by mouth 2 (two) times daily.         [name] omeprazole (PRILOSEC) 20 MG capsule   Oral   Take 20 mg by mouth daily.          [name] oxyCODONE-acetaminophen (PERCOCET/ROXICET) 5-325 MG per tablet   Oral   Take 2 tablets by mouth every 6 (six) hours as needed for pain.         . pravastatin (PRAVACHOL) 20 MG tablet   Oral   Take 20 mg by mouth daily.         [name] tiotropium (SPIRIVA) 18 MCG inhalation capsule   Inhalation   Place 1 capsule (18 mcg total) into inhaler and inhale daily.   30 capsule   0    BP 95/67  Pulse 99  Temp(Src) 98.5 F (36.9 C) (Oral)  Resp 19  SpO2 90% Physical Exam  Nursing note and vitals reviewed. Constitutional: He is oriented to person, place, and time. He appears well-developed and well-nourished. No distress.  Smell of alcohol on breath  HENT:  Head: Normocephalic and atraumatic.  Mouth/Throat: Oropharynx is clear and moist.  Eyes: Conjunctivae and EOM are normal. Pupils are equal, round, and reactive to light. Right eye exhibits no discharge. Left eye exhibits no discharge.  Neck: Normal range of  motion. Neck supple.  Cardiovascular: Normal rate, regular rhythm and normal heart sounds.  Exam reveals no friction rub.   No murmur heard. Pulses:      Radial pulses are 2+ on the right side, and 2+ on the left side.       Dorsalis pedis pulses are 2+ on the right side, and 2+ on the left side.  Negative tachycardia heard -  normal rate Bilateral leg swelling noted 2+ pitting edema bilaterally: right - from foot to below the knee; left - from foot to mid-shin  Pulmonary/Chest: Effort normal. No respiratory distress. He has wheezes in the right middle field, the right lower field and the left lower field. He has rhonchi in the right middle field, the right lower field and the left lower field. He exhibits no tenderness.  Expiratory wheezes and rhonchi auscultated bilaterally  Musculoskeletal: Normal range of motion.  Neurological: He is alert and oriented to person, place, and time. No cranial nerve deficit. Coordination normal.  Cranial nerves III-XII grossly intact  Sensation intact  Strength 5+/5+ with resistance  Skin: Skin is warm and dry. No rash noted. He is not diaphoretic. No erythema.  Psychiatric: He has a normal mood and affect. His behavior is normal. Thought content normal.    ED Course  Procedures (including critical care time)   Date: 08/29/2012  Rate: 106  Rhythm: sinus tachycardia  QRS Axis: right  Intervals: normal  ST/T Wave abnormalities: normal  Conduction Disutrbances:none  Narrative Interpretation:   Old EKG Reviewed: unchanged   Labs Reviewed  POCT I-STAT TROPONIN I   Dg Chest 2 View  08/28/2012   *RADIOLOGY REPORT*  Clinical Data: Shortness of breath.  CHEST - 2 VIEW  Comparison: 08/27/12  Findings:  Normal heart size.  No pleural effusion or edema.  No airspace consolidation identified.  Mild spondylosis noted within the thoracic spine.  IMPRESSION:  1.  No acute cardiopulmonary abnormalities.   Original Report Authenticated By: Signa Kell, M.D.    1. SOB (shortness of breath)   2. Peripheral edema   3. DM (diabetes mellitus)   4. HTN (hypertension)   5. HLD (hyperlipidemia)   6. COPD (chronic obstructive pulmonary disease)   7. CHF (congestive heart failure), unspecified failure chronicity, unspecified type     MDM  Patient was seen seen in ED setting on 08/27/2012 for similar complaints - patient has known chronic DVT of RLE. Was assessed by Dr. Edilia Bo of vascular and vein surgery - verified that legs were perfused well. CTA of abdomen and pelvis performed - negative for AAA, negative findings. Chest xray negative findings. Patient was discharged with Percocet 10 tablets - instructed to follow-up with vascular surgery. Patient seen in ED on 08/28/2012 for same complaints - given Lasix, decadron, and nebulizer treatments. Discharged with Percocets 12 tablets.  Patient presenting today, 08/29/2012, to the ED, under the influence of alcohol, with long-standing ongoing bilateral leg swelling, shortness of breath, cough, and intermittent chest pain. Stating that the medication they gave him are not helping - stated that he took Lasix today and legs are still swollen. Discussed with patient that he needs to continuously take the medications as prescribed in order for swelling to decrease - discussed with patient that the DVT and CHF he has is leading to the swelling of his legs. Patient angry that he only received 3 days worth of pain medications. Discussed with patient that he needs to follow pain physician to control pain. Due to rhonchi and wheezing heard upon auscultation of the lungs - nebulizer treatments ordered.  EKG negative ischemic changes noted - first set of troponins negative.   Went to check up on patient - clothing, patient and friend were no where in sight. Nurse went after patient where he was taken outside in wheelchair by friend. Patient was found smoking a cigarette - stated that he was tired of waiting. Nurse tried to  convince to stay - patient refused. Providers notified that patient left AMA.      Raymon Mutton, PA-C 08/29/12 [phone]

## 2012-08-29 NOTE — ED Notes (Signed)
Patient's friend just rolled him outside in a wheelchair, he was found outside smoking a cigarette and stated "Lavenia Atlas been waiting for 8hrs and I am not waiting any longer" Notified the PA and Dr. Norlene Campbell regarding patients decision to leave AMA.

## 2012-08-29 NOTE — ED Provider Notes (Signed)
Medical screening examination/treatment/procedure(s) were performed by non-physician practitioner and as supervising physician I was immediately available for consultation/collaboration.  Toy Baker, MD 08/29/12 1116

## 2012-08-31 ENCOUNTER — Ambulatory Visit: Payer: Medicaid Other | Admitting: Vascular Surgery

## 2012-08-31 ENCOUNTER — Other Ambulatory Visit: Payer: Medicaid Other

## 2012-09-11 ENCOUNTER — Emergency Department (HOSPITAL_COMMUNITY)
Admission: EM | Admit: 2012-09-11 | Discharge: 2012-09-12 | Disposition: A | Discharge status: Home / Self Care | Payer: Medicaid Other | Attending: Emergency Medicine | Admitting: Emergency Medicine

## 2012-09-11 ENCOUNTER — Encounter (HOSPITAL_COMMUNITY): Payer: Self-pay | Admitting: Nurse Practitioner

## 2012-09-11 ENCOUNTER — Emergency Department (HOSPITAL_COMMUNITY): Payer: Medicaid Other

## 2012-09-11 DIAGNOSIS — R609 Edema, unspecified: Secondary | ICD-10-CM | POA: Insufficient documentation

## 2012-09-11 DIAGNOSIS — Z9889 Other specified postprocedural states: Secondary | ICD-10-CM | POA: Insufficient documentation

## 2012-09-11 DIAGNOSIS — M129 Arthropathy, unspecified: Secondary | ICD-10-CM | POA: Insufficient documentation

## 2012-09-11 DIAGNOSIS — I509 Heart failure, unspecified: Secondary | ICD-10-CM | POA: Insufficient documentation

## 2012-09-11 DIAGNOSIS — Z79899 Other long term (current) drug therapy: Secondary | ICD-10-CM | POA: Insufficient documentation

## 2012-09-11 DIAGNOSIS — I2510 Atherosclerotic heart disease of native coronary artery without angina pectoris: Secondary | ICD-10-CM | POA: Insufficient documentation

## 2012-09-11 DIAGNOSIS — F3289 Other specified depressive episodes: Secondary | ICD-10-CM | POA: Insufficient documentation

## 2012-09-11 DIAGNOSIS — M549 Dorsalgia, unspecified: Secondary | ICD-10-CM | POA: Insufficient documentation

## 2012-09-11 DIAGNOSIS — Z86718 Personal history of other venous thrombosis and embolism: Secondary | ICD-10-CM | POA: Insufficient documentation

## 2012-09-11 DIAGNOSIS — F10220 Alcohol dependence with intoxication, uncomplicated: Secondary | ICD-10-CM

## 2012-09-11 DIAGNOSIS — Z8701 Personal history of pneumonia (recurrent): Secondary | ICD-10-CM | POA: Insufficient documentation

## 2012-09-11 DIAGNOSIS — G4733 Obstructive sleep apnea (adult) (pediatric): Secondary | ICD-10-CM | POA: Insufficient documentation

## 2012-09-11 DIAGNOSIS — K219 Gastro-esophageal reflux disease without esophagitis: Secondary | ICD-10-CM | POA: Insufficient documentation

## 2012-09-11 DIAGNOSIS — J441 Chronic obstructive pulmonary disease with (acute) exacerbation: Secondary | ICD-10-CM | POA: Insufficient documentation

## 2012-09-11 DIAGNOSIS — I252 Old myocardial infarction: Secondary | ICD-10-CM | POA: Insufficient documentation

## 2012-09-11 DIAGNOSIS — I739 Peripheral vascular disease, unspecified: Secondary | ICD-10-CM | POA: Insufficient documentation

## 2012-09-11 DIAGNOSIS — I10 Essential (primary) hypertension: Secondary | ICD-10-CM | POA: Insufficient documentation

## 2012-09-11 DIAGNOSIS — F17200 Nicotine dependence, unspecified, uncomplicated: Secondary | ICD-10-CM | POA: Insufficient documentation

## 2012-09-11 DIAGNOSIS — G8929 Other chronic pain: Secondary | ICD-10-CM | POA: Insufficient documentation

## 2012-09-11 DIAGNOSIS — Z9981 Dependence on supplemental oxygen: Secondary | ICD-10-CM | POA: Insufficient documentation

## 2012-09-11 DIAGNOSIS — E785 Hyperlipidemia, unspecified: Secondary | ICD-10-CM | POA: Insufficient documentation

## 2012-09-11 DIAGNOSIS — F1010 Alcohol abuse, uncomplicated: Secondary | ICD-10-CM | POA: Insufficient documentation

## 2012-09-11 DIAGNOSIS — F329 Major depressive disorder, single episode, unspecified: Secondary | ICD-10-CM | POA: Insufficient documentation

## 2012-09-11 DIAGNOSIS — F411 Generalized anxiety disorder: Secondary | ICD-10-CM | POA: Insufficient documentation

## 2012-09-11 LAB — CBC WITH DIFFERENTIAL/PLATELET
Basophils Relative: 1 % (ref 0–1)
Eosinophils Absolute: 0.2 10*3/uL (ref 0.0–0.7)
HCT: 46.9 % (ref 39.0–52.0)
Hemoglobin: 15.6 g/dL (ref 13.0–17.0)
MCH: 31 pg (ref 26.0–34.0)
MCHC: 33.3 g/dL (ref 30.0–36.0)
MCV: 93.1 fL (ref 78.0–100.0)
Monocytes Absolute: 0.5 10*3/uL (ref 0.1–1.0)
Monocytes Relative: 7 % (ref 3–12)

## 2012-09-11 LAB — COMPREHENSIVE METABOLIC PANEL
Albumin: 3.6 g/dL (ref 3.5–5.2)
BUN: 11 mg/dL (ref 6–23)
Creatinine, Ser: 0.62 mg/dL (ref 0.50–1.35)
GFR calc Af Amer: >90 mL/min (ref >90)
Total Bilirubin: 0.2 mg/dL — ABNORMAL LOW (ref 0.3–1.2)
Total Protein: 6.9 g/dL (ref 6.0–8.3)

## 2012-09-11 LAB — LIPASE, BLOOD: Lipase: 18 U/L (ref 11–59)

## 2012-09-11 LAB — RAPID URINE DRUG SCREEN, HOSP PERFORMED: Opiates: NOT DETECTED

## 2012-09-11 LAB — ETHANOL: Alcohol, Ethyl (B): 264 mg/dL — ABNORMAL HIGH (ref 0–11)

## 2012-09-11 LAB — PRO B NATRIURETIC PEPTIDE: Pro B Natriuretic peptide (BNP): 100.4 pg/mL (ref 0–125)

## 2012-09-11 LAB — POCT I-STAT TROPONIN I: Troponin i, poc: 0 ng/mL (ref 0.00–0.08)

## 2012-09-11 MED ORDER — ALBUTEROL SULFATE HFA 108 (90 BASE) MCG/ACT IN AERS
2.0000 | INHALATION_SPRAY | Freq: Once | RESPIRATORY_TRACT | Status: AC
  Administered 2012-09-11: 2 via RESPIRATORY_TRACT
  Filled 2012-09-11: qty 6.7

## 2012-09-11 MED ORDER — FUROSEMIDE 10 MG/ML IJ SOLN
60.0000 mg | Freq: Once | INTRAMUSCULAR | Status: AC
  Administered 2012-09-11: 60 mg via INTRAVENOUS
  Filled 2012-09-11 (×2): qty 4

## 2012-09-11 NOTE — ED Notes (Signed)
Report given to [name], RN.

## 2012-09-11 NOTE — ED Notes (Signed)
Patient transported to X-ray 

## 2012-09-11 NOTE — ED Notes (Signed)
[id number]<AV> Expected date:<BR> Expected time:<BR> Means of arrival:<BR> Comments:<BR> Detox--EMS

## 2012-09-11 NOTE — ED Notes (Signed)
Per EMS:  Pt called EMS stating that he needs detox because he has been drinking nonstop and he stated, "I do not know what time it is."  He is currently intoxicated.  Pt does not show any signs of tremors, but has a productive cough.

## 2012-09-11 NOTE — ED Notes (Signed)
Pt coughing a lot with productive cough- increased HOB and suction secretions.

## 2012-09-11 NOTE — ED Notes (Addendum)
Pt a shirt, pr of pants, underwear, shoes, silver and black walking cane, glasses, cellphone, Therapist, sports, and wallet.  Wallet given to security.

## 2012-09-11 NOTE — ED Provider Notes (Signed)
CSN: [id number]     Arrival date & time 09/11/12  1910 History     First MD Initiated Contact with Patient 09/11/12 1911     Chief Complaint  Patient presents with  . Alcohol Intoxication   (Consider location/radiation/quality/duration/timing/severity/associated sxs/prior Treatment) Patient is a 62 y.o. male presenting with intoxication.  Alcohol Intoxication   MIQUEL STACKS is a 62 y.o. male who presents to ED with complaint alcohol intoxication, confusion, chronic back pain. Pt appears intoxicated, asking for pain medications, poor historian. Pt unable to give me any history at this time other than he has COPD and drank an large amount of alcohol tonight.   Past Medical History  Diagnosis Date  . Depression   . Hypertension   . Hyperlipidemia   . Myocardial infarction 2000  . CHF (congestive heart failure)   . COPD (chronic obstructive pulmonary disease)   . Peripheral vascular disease, unspecified     08/20/10 doppler: increase in right ABI post-op. Left ABI stable. S/P bi-fem bypass surgery  . GERD (gastroesophageal reflux disease)   . Asthma   . Anxiety   . History of DVT of lower extremity   . CAD (coronary artery disease) 05/27/10    Cath: severe single vessell CAD left cx midportion obtuse marginal 2 to 3.  . Shortness of breath   . Pneumonia 04/04/2012  . Arthritis   . OSA on CPAP   . Hyperlipemia   . COPD (chronic obstructive pulmonary disease)   . Alcohol abuse     H/O  . Tobacco abuse    Past Surgical History  Procedure Laterality Date  . Femoral bypass  08/19/10    Right Fem-Pop  . Aortogram w/ ptca  09/292003  . Eye surgery    . Back surgery    . Total knee arthroplasty    . Cholecystectomy    . Cardiac catheterization  05/27/10    severe CAD left cx  . Abdoninal ao angio & bifem angio  05/27/10    Patent graft, occluded bil stents with no retrograde flow into the hypogastric arteries. 100% occl left ant. tibial artery, 70% to 80% to 100% stenosis  right superficial fem artery above adductor canal. 100 % occl right ant. tibial vessell   Family History  Problem Relation Age of Onset  . Heart attack Mother   . Cirrhosis Father   . Heart failure Mother   . Heart failure Brother   . Cancer Brother    History  Substance Use Topics  . Smoking status: Current Every Day Smoker -- 1.50 packs/day for 45 years    Types: Cigarettes  . Smokeless tobacco: Never Used     Comment: refuses information  . Alcohol Use: Yes    Review of Systems  Unable to perform ROS: Mental status change  Neurological: Negative.     Allergies  Darvocet and Haldol  Home Medications   Current Outpatient Rx  Name  Route  Sig  Dispense  Refill  . albuterol (PROVENTIL HFA;VENTOLIN HFA) 108 (90 BASE) MCG/ACT inhaler   Inhalation   Inhale 2 puffs into the lungs every 6 (six) hours as needed for wheezing or shortness of breath.         [name] amLODipine (NORVASC) 10 MG tablet   Oral   Take 10 mg by mouth daily.         . colchicine 0.6 MG tablet   Oral   Take 0.6 mg by mouth 2 (two) times daily.         [name]  furosemide (LASIX) 40 MG tablet   Oral   Take 1 tablet (40 mg total) by mouth daily.   30 tablet   0   . meloxicam (MOBIC) 7.5 MG tablet   Oral   Take 7.5 mg by mouth 2 (two) times daily.         [name] omeprazole (PRILOSEC) 20 MG capsule   Oral   Take 20 mg by mouth daily.          [name] oxyCODONE-acetaminophen (PERCOCET/ROXICET) 5-325 MG per tablet   Oral   Take 2 tablets by mouth every 6 (six) hours as needed for pain.         . pravastatin (PRAVACHOL) 20 MG tablet   Oral   Take 20 mg by mouth daily.         [name] tiotropium (SPIRIVA) 18 MCG inhalation capsule   Inhalation   Place 1 capsule (18 mcg total) into inhaler and inhale daily.   30 capsule   0    BP 148/76  Pulse 91  Temp(Src) 98 F (36.7 C) (Oral)  Resp 32  SpO2 94% Physical Exam  Nursing note and vitals reviewed. Constitutional: He appears well-developed and  well-nourished.  Pt appears intoxicated, speech slurred  HENT:  Head: Normocephalic and atraumatic.  Eyes: Conjunctivae are normal. Pupils are equal, round, and reactive to light.  Neck: Neck supple.  Cardiovascular: Normal rate, regular rhythm and normal heart sounds.   Pulmonary/Chest: Effort normal. No respiratory distress. He has wheezes. He has rales.  Abdominal: Soft. Bowel sounds are normal. He exhibits no distension. There is no tenderness. There is no rebound.  Musculoskeletal: Normal range of motion.  1+ LE bilateral pitting edema  Neurological: He is alert. No cranial nerve deficit. Coordination normal.  oriented to self only. 5/5 and equal upper and lower extremity strength bilaterally. Equal grip strength bilaterally. No pronator drift. Unable to perform finger to nose  Skin: Skin is warm and dry.  Psychiatric:  Pt is intoxicated, angry. No SI or HI    ED Course   Procedures (including critical care time)  Results for orders placed during the hospital encounter of 09/11/12  CBC WITH DIFFERENTIAL      Result Value Range   WBC 7.3  4.0 - 10.5 K/uL   RBC 5.04  4.22 - 5.81 MIL/uL   Hemoglobin 15.6  13.0 - 17.0 g/dL   HCT 40.9  81.1 - 91.4 %   MCV 93.1  78.0 - 100.0 fL   MCH 31.0  26.0 - 34.0 pg   MCHC 33.3  30.0 - 36.0 g/dL   RDW 78.2  95.6 - 21.3 %   Platelets 205  150 - 400 K/uL   Neutrophils Relative % 51  43 - 77 %   Neutro Abs 3.7  1.7 - 7.7 K/uL   Lymphocytes Relative 38  12 - 46 %   Lymphs Abs 2.8  0.7 - 4.0 K/uL   Monocytes Relative 7  3 - 12 %   Monocytes Absolute 0.5  0.1 - 1.0 K/uL   Eosinophils Relative 3  0 - 5 %   Eosinophils Absolute 0.2  0.0 - 0.7 K/uL   Basophils Relative 1  0 - 1 %   Basophils Absolute 0.0  0.0 - 0.1 K/uL  COMPREHENSIVE METABOLIC PANEL      Result Value Range   Sodium 146 (*) 135 - 145 mEq/L   Potassium 4.6  3.5 - 5.1 mEq/L   Chloride 107  96 -  112 mEq/L   CO2 25  19 - 32 mEq/L   Glucose, Bld 96  70 - 99 mg/dL   BUN 11  6  - 23 mg/dL   Creatinine, Ser 9.60  0.50 - 1.35 mg/dL   Calcium 9.6  8.4 - 45.4 mg/dL   Total Protein 6.9  6.0 - 8.3 g/dL   Albumin 3.6  3.5 - 5.2 g/dL   AST 41 (*) 0 - 37 U/L   ALT 44  0 - 53 U/L   Alkaline Phosphatase 60  39 - 117 U/L   Total Bilirubin 0.2 (*) 0.3 - 1.2 mg/dL   GFR calc non Af Amer >90  >90 mL/min   GFR calc Af Amer >90  >90 mL/min  LIPASE, BLOOD      Result Value Range   Lipase 18  11 - 59 U/L  ETHANOL      Result Value Range   Alcohol, Ethyl (B) 264 (*) 0 - 11 mg/dL  URINE RAPID DRUG SCREEN (HOSP PERFORMED)      Result Value Range   Opiates NONE DETECTED  NONE DETECTED   Cocaine NONE DETECTED  NONE DETECTED   Benzodiazepines NONE DETECTED  NONE DETECTED   Amphetamines NONE DETECTED  NONE DETECTED   Tetrahydrocannabinol NONE DETECTED  NONE DETECTED   Barbiturates NONE DETECTED  NONE DETECTED  PRO B NATRIURETIC PEPTIDE      Result Value Range   Pro B Natriuretic peptide (BNP) 100.4  0 - 125 pg/mL  AMMONIA      Result Value Range   Ammonia 29  11 - 60 umol/L  POCT I-STAT TROPONIN I      Result Value Range   Troponin i, poc 0.00  0.00 - 0.08 ng/mL   Comment 3            Dg Chest 2 View  09/11/2012   *RADIOLOGY REPORT*  Clinical Data: Cough, hypertension, CHF  CHEST - 2 VIEW  Comparison: 08/28/2012  Findings: cardiomegaly evident with vascular congestion.  Basilar atelectasis noted.  No definite CHF pattern or pneumonia.  No significant effusion or pneumothorax.  Trachea is midline.  IMPRESSION: Cardiomegaly with vascular congestion   Original Report Authenticated By: Judie Petit. Miles Costain, M.D.    Date: 09/11/2012  Rate: 80  Rhythm: normal sinus rhythm  QRS Axis: normal  Intervals: normal  ST/T Wave abnormalities: normal  Conduction Disutrbances: none  Narrative Interpretation:   Old EKG Reviewed: No significant changes noted     1. Alcohol intoxication in active alcoholic, uncomplicated   2. COPD exacerbation   3. Chronic back pain     MDM  7:51 PM Pt  seen and examined. Very intoxicated at this time. He is able to carry on a conversation but appears confused. No signs of head trauma. Admits to lot of alcohol. Will get labs. CXR due to SOB complaint. Will monitor.   11:44 PM Pt monitored. He is calm, cooperative at this time. Appears angry that we are not treating his pain. Explained that I was waiting on him to sober up. Pt states he wants to leave and go home. I have given him Lasix 60mg  IV, inhaler 2 puffs. CXR shows cardiomegaly with vascular congestion. His VS are normal, with oxygen sat above 40%. Pt is afebrile. No vomiting. No signs of a head injuury. Labs at baseline. No hypoxia, no respiratory distress, no tachypnea, no tachycardia, doubt PE at this time. ECG normal. Troponin negative. Will ambulate to make sure he is  sober enough to be discharged  12:14 AM Pt ambulated with no problems. Will d/c home. Instructed to continue current medications. Inhaler for sob. Follow up with pcp closely.  STOP DRINKING AND SMOKING.   Filed Vitals:   09/11/12 2230 09/11/12 2256 09/12/12 0003 09/12/12 0005  BP: 129/48 141/64 162/76 152/72  Pulse: 79  102   Temp: 98.5 F (36.9 C)     TempSrc: Oral     Resp: 25  24   SpO2: 96%  95%      Davey Bergsma A Tavon Magnussen, PA-C 09/12/12 0025

## 2012-09-11 NOTE — ED Notes (Signed)
Pt stated that he is not able to urinate.   Will try again in 30 minutes. 

## 2012-09-12 NOTE — ED Notes (Signed)
Pt ambulated over 200 ft.  Maintained steady gait.   Held on to IV pole while ambulating but need no further assistance.  Vital signs were slightly elevated after ambulation.    Rechecked blood pressure and came down from 162/76 to 152/72.

## 2012-09-12 NOTE — ED Provider Notes (Signed)
Medical screening examination/treatment/procedure(s) were performed by non-physician practitioner and as supervising physician I was immediately available for consultation/collaboration.   William Keean Wilmeth, MD 09/12/12 0200 

## 2012-09-12 NOTE — ED Notes (Signed)
Pt ambulated down the hallway, gait steady.

## 2012-10-05 IMAGING — CR DG LUMBAR SPINE 2-3V
3 series · 3 of 3 positions shown · non-contrast
Comparison: CT abdomen pelvis 10/09/2009.

CLINICAL DATA: Low back pain.

LUMBAR SPINE - 2-3 VIEW

[t l-spine a.p.]
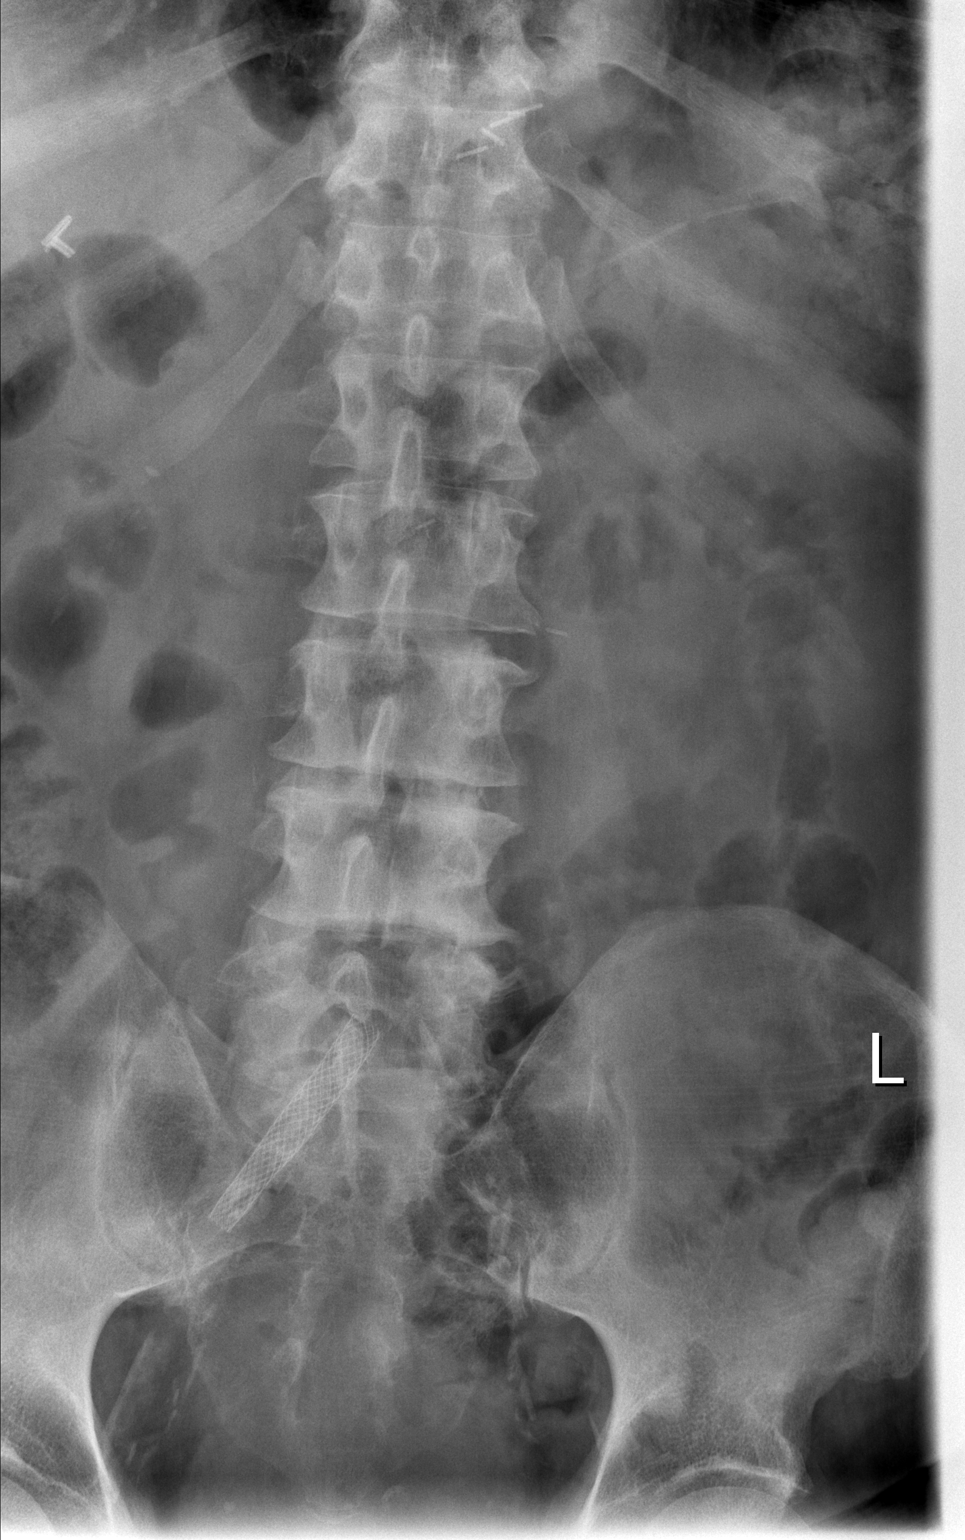

[t l-spine lat]
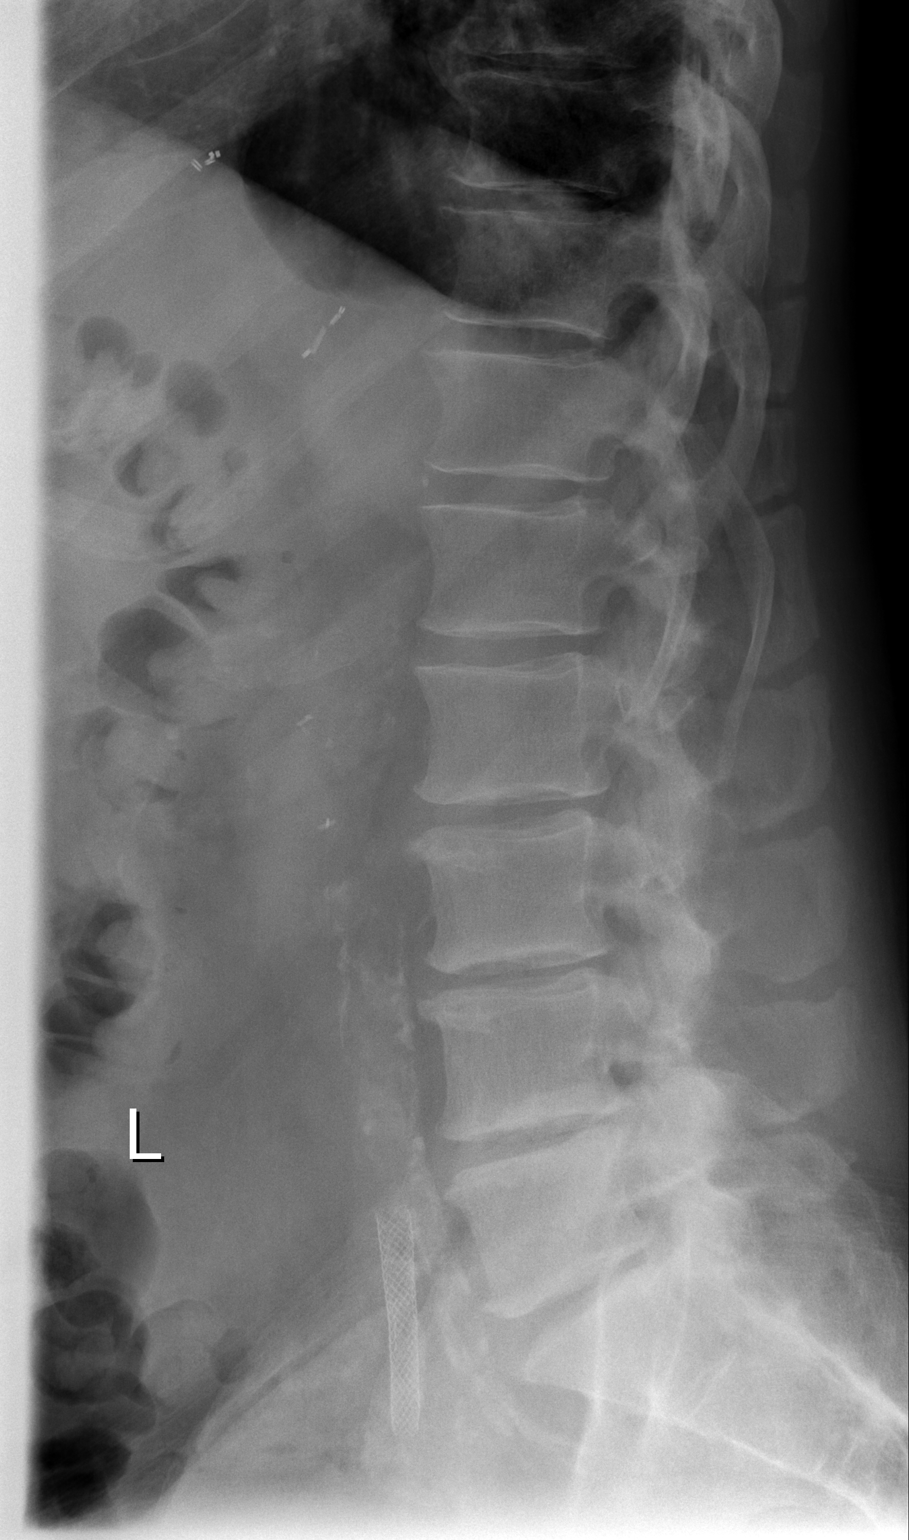

[t l-spine l5-s1 spot]
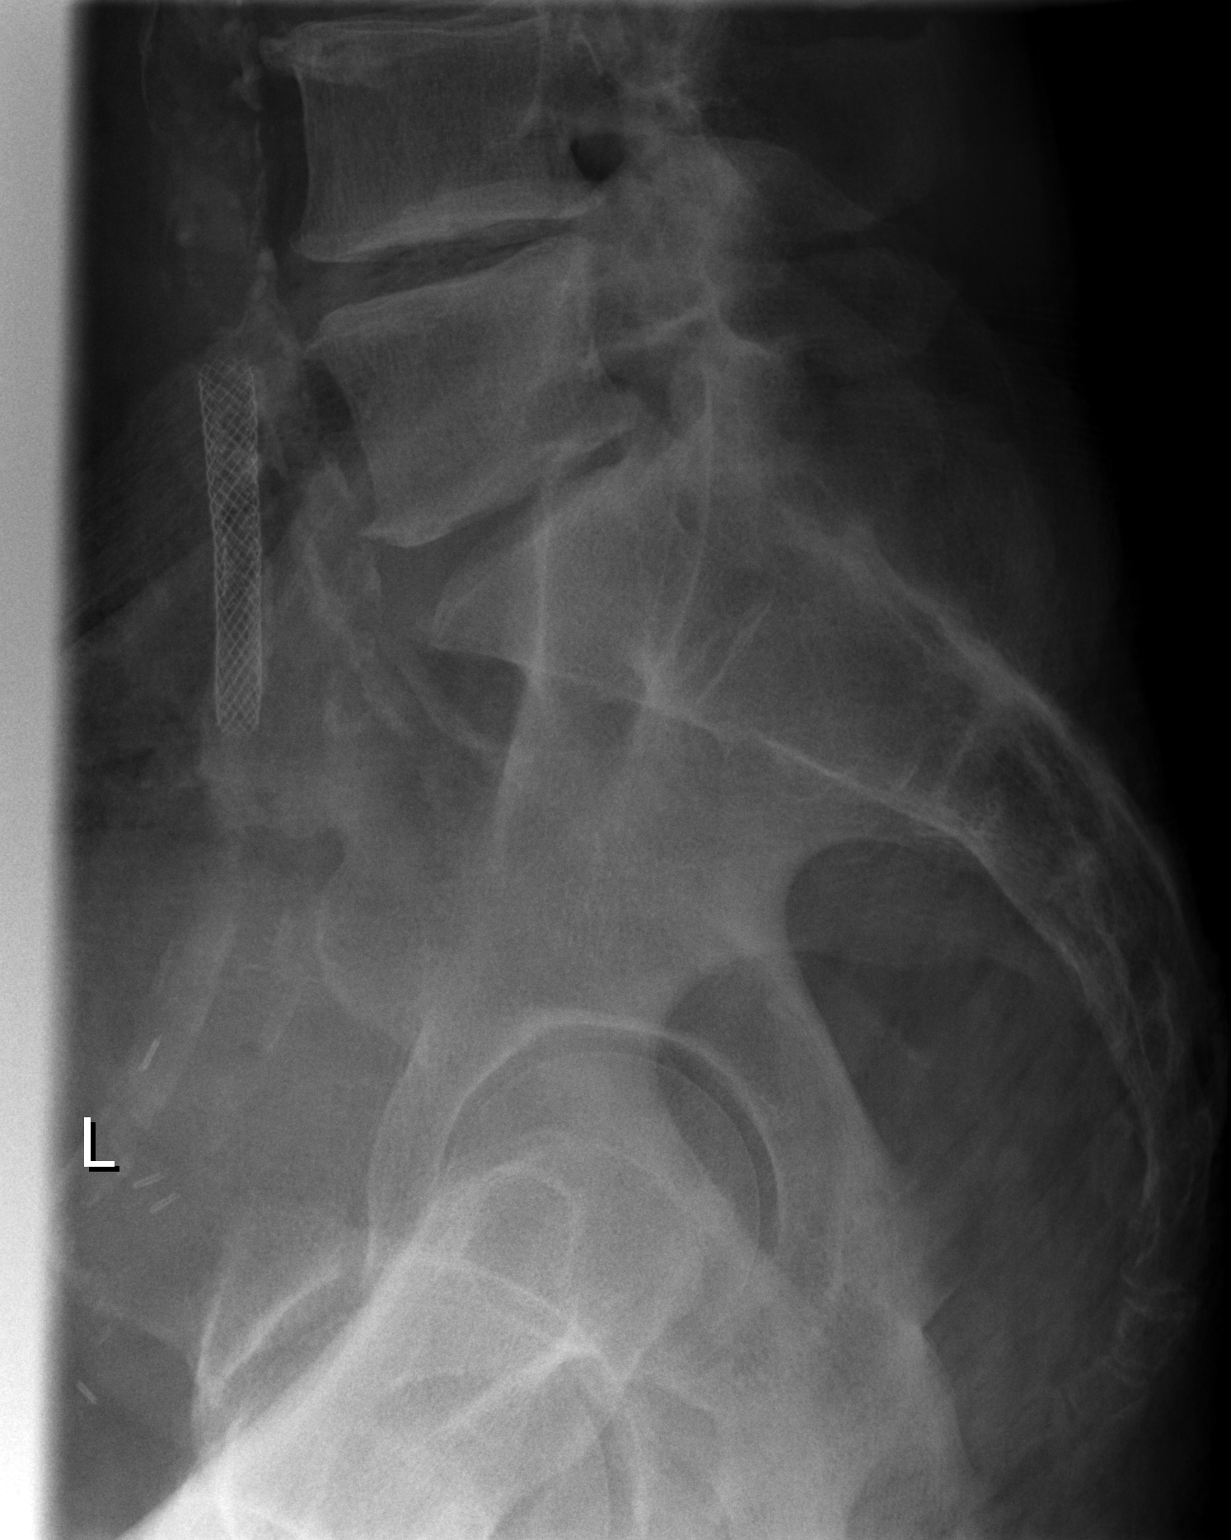

[3 of 3 positions shown; findings below may reference images not displayed]

FINDINGS: There is straightening of the normal lumbar lordosis.
Vertebral body height and alignment are otherwise maintained.
Endplate degenerative changes are most prominent at L2-3 through L5-
S1.  Loss of disc space height at L3-4, L4-5 and L5-S1.  Facet
hypertrophy is seen at these levels as well.  There is
atherosclerotic calcification of the arterial vasculature, with a
right common iliac artery stent.
IMPRESSION: Multilevel spondylosis.

## 2012-12-10 ENCOUNTER — Encounter (HOSPITAL_COMMUNITY): Payer: Self-pay | Admitting: Emergency Medicine

## 2012-12-10 ENCOUNTER — Emergency Department (HOSPITAL_COMMUNITY)
Admission: EM | Admit: 2012-12-10 | Discharge: 2012-12-10 | Disposition: A | Discharge status: Home / Self Care | Payer: Medicaid Other | Attending: Emergency Medicine | Admitting: Emergency Medicine

## 2012-12-10 ENCOUNTER — Emergency Department (HOSPITAL_COMMUNITY): Payer: Medicaid Other

## 2012-12-10 DIAGNOSIS — G8929 Other chronic pain: Secondary | ICD-10-CM | POA: Insufficient documentation

## 2012-12-10 DIAGNOSIS — G4733 Obstructive sleep apnea (adult) (pediatric): Secondary | ICD-10-CM | POA: Insufficient documentation

## 2012-12-10 DIAGNOSIS — M549 Dorsalgia, unspecified: Secondary | ICD-10-CM | POA: Insufficient documentation

## 2012-12-10 DIAGNOSIS — K219 Gastro-esophageal reflux disease without esophagitis: Secondary | ICD-10-CM | POA: Insufficient documentation

## 2012-12-10 DIAGNOSIS — M7989 Other specified soft tissue disorders: Secondary | ICD-10-CM | POA: Insufficient documentation

## 2012-12-10 DIAGNOSIS — R1011 Right upper quadrant pain: Secondary | ICD-10-CM | POA: Insufficient documentation

## 2012-12-10 DIAGNOSIS — S0285XA Fracture of orbit, unspecified, initial encounter for closed fracture: Secondary | ICD-10-CM

## 2012-12-10 DIAGNOSIS — F17200 Nicotine dependence, unspecified, uncomplicated: Secondary | ICD-10-CM | POA: Insufficient documentation

## 2012-12-10 DIAGNOSIS — R0602 Shortness of breath: Secondary | ICD-10-CM | POA: Insufficient documentation

## 2012-12-10 DIAGNOSIS — M79609 Pain in unspecified limb: Secondary | ICD-10-CM | POA: Insufficient documentation

## 2012-12-10 DIAGNOSIS — I509 Heart failure, unspecified: Secondary | ICD-10-CM | POA: Insufficient documentation

## 2012-12-10 DIAGNOSIS — Z8701 Personal history of pneumonia (recurrent): Secondary | ICD-10-CM | POA: Insufficient documentation

## 2012-12-10 DIAGNOSIS — Z8659 Personal history of other mental and behavioral disorders: Secondary | ICD-10-CM | POA: Insufficient documentation

## 2012-12-10 DIAGNOSIS — I252 Old myocardial infarction: Secondary | ICD-10-CM | POA: Insufficient documentation

## 2012-12-10 DIAGNOSIS — I2510 Atherosclerotic heart disease of native coronary artery without angina pectoris: Secondary | ICD-10-CM | POA: Insufficient documentation

## 2012-12-10 DIAGNOSIS — J40 Bronchitis, not specified as acute or chronic: Secondary | ICD-10-CM

## 2012-12-10 DIAGNOSIS — Z79899 Other long term (current) drug therapy: Secondary | ICD-10-CM | POA: Insufficient documentation

## 2012-12-10 DIAGNOSIS — J441 Chronic obstructive pulmonary disease with (acute) exacerbation: Secondary | ICD-10-CM | POA: Insufficient documentation

## 2012-12-10 DIAGNOSIS — Z8639 Personal history of other endocrine, nutritional and metabolic disease: Secondary | ICD-10-CM | POA: Insufficient documentation

## 2012-12-10 DIAGNOSIS — Z862 Personal history of diseases of the blood and blood-forming organs and certain disorders involving the immune mechanism: Secondary | ICD-10-CM | POA: Insufficient documentation

## 2012-12-10 DIAGNOSIS — F1010 Alcohol abuse, uncomplicated: Secondary | ICD-10-CM

## 2012-12-10 DIAGNOSIS — R042 Hemoptysis: Secondary | ICD-10-CM | POA: Insufficient documentation

## 2012-12-10 DIAGNOSIS — Z95818 Presence of other cardiac implants and grafts: Secondary | ICD-10-CM | POA: Insufficient documentation

## 2012-12-10 DIAGNOSIS — Z86718 Personal history of other venous thrombosis and embolism: Secondary | ICD-10-CM | POA: Insufficient documentation

## 2012-12-10 DIAGNOSIS — R5381 Other malaise: Secondary | ICD-10-CM | POA: Insufficient documentation

## 2012-12-10 DIAGNOSIS — Z8739 Personal history of other diseases of the musculoskeletal system and connective tissue: Secondary | ICD-10-CM | POA: Insufficient documentation

## 2012-12-10 DIAGNOSIS — I10 Essential (primary) hypertension: Secondary | ICD-10-CM | POA: Insufficient documentation

## 2012-12-10 HISTORY — DX: Fracture of orbit, unspecified, initial encounter for closed fracture: S02.85XA

## 2012-12-10 LAB — BASIC METABOLIC PANEL
CO2: 21 mEq/L (ref 19–32)
Calcium: 9.4 mg/dL (ref 8.4–10.5)
Chloride: 109 mEq/L (ref 96–112)
Creatinine, Ser: 1.12 mg/dL (ref 0.50–1.35)
GFR calc Af Amer: 79 mL/min — ABNORMAL LOW (ref >90)
Sodium: 144 mEq/L (ref 135–145)

## 2012-12-10 LAB — CBC
MCV: 95.6 fL (ref 78.0–100.0)
Platelets: 171 10*3/uL (ref 150–400)
RBC: 4.75 MIL/uL (ref 4.22–5.81)
RDW: 14.7 % (ref 11.5–15.5)
WBC: 6.1 10*3/uL (ref 4.0–10.5)

## 2012-12-10 LAB — POCT I-STAT TROPONIN I: Troponin i, poc: 0 ng/mL (ref 0.00–0.08)

## 2012-12-10 LAB — PRO B NATRIURETIC PEPTIDE: Pro B Natriuretic peptide (BNP): 68.7 pg/mL (ref 0–125)

## 2012-12-10 LAB — ETHANOL: Alcohol, Ethyl (B): 202 mg/dL — ABNORMAL HIGH (ref 0–11)

## 2012-12-10 MED ORDER — SODIUM CHLORIDE 0.9 % IV BOLUS (SEPSIS)
500.0000 mL | Freq: Once | INTRAVENOUS | Status: AC
  Administered 2012-12-10: 500 mL via INTRAVENOUS

## 2012-12-10 MED ORDER — AMOXICILLIN-POT CLAVULANATE 875-125 MG PO TABS: 1.0000 | ORAL_TABLET | Freq: Two times a day (BID) | ORAL | Status: DC

## 2012-12-10 MED ORDER — IOHEXOL 350 MG/ML SOLN
80.0000 mL | Freq: Once | INTRAVENOUS | Status: AC | PRN
  Administered 2012-12-10: 80 mL via INTRAVENOUS

## 2012-12-10 NOTE — Progress Notes (Signed)
Unable to document and link contrast administration due to IV site not yet documented into EPIC, used @20g  in left ac that was present on arrival-

## 2012-12-10 NOTE — ED Provider Notes (Signed)
CSN: [id number]     Arrival date & time 12/10/12  8295 History   First MD Initiated Contact with Patient 12/10/12 [phone]     Chief Complaint  Patient presents with  . Hemoptysis  . Shortness of Breath  . Chest Pain   (Consider location/radiation/quality/duration/timing/severity/associated sxs/prior Treatment) The history is provided by the patient.    Pt is a 62 yo CM with an extensive PMH including HTN, CHF (EF 60-65%), COPD, PVD, GERD, CAD, and h/o LE DVT.  He presents to the ED with c/o coughing up greenish phlegm for the past several days and coughing up bright red blood that began this morning.  He states it is about a tablespoon.  He has associated SOB and CP.  He denies any fever, chills, N/V.  He does report taking 3 ibuprofen/day for the past month and has an extensive h/o alcohol abuse reporting that he drank a pint of liquor last night and drank a shot of vodka this AM as he thought this would "make him feel better."  He smells of alcohol. He reports chronic back pain and bilateral leg pain that begins mainly in his right foot and "shoots up his leg."  He also has a h/o gout.  It is unclear if he is actually taking his medications at home.  He states his doctor told him not to take the fluid pill.     Past Medical History  Diagnosis Date  . Depression   . Hypertension   . Hyperlipidemia   . Myocardial infarction 2000  . CHF (congestive heart failure)   . COPD (chronic obstructive pulmonary disease)   . Peripheral vascular disease, unspecified     08/20/10 doppler: increase in right ABI post-op. Left ABI stable. S/P bi-fem bypass surgery  . GERD (gastroesophageal reflux disease)   . Asthma   . Anxiety   . History of DVT of lower extremity   . CAD (coronary artery disease) 05/27/10    Cath: severe single vessell CAD left cx midportion obtuse marginal 2 to 3.  . Shortness of breath   . Pneumonia 04/04/2012  . Arthritis   . OSA on CPAP   . Hyperlipemia   . COPD (chronic  obstructive pulmonary disease)   . Alcohol abuse     H/O  . Tobacco abuse    Past Surgical History  Procedure Laterality Date  . Femoral bypass  08/19/10    Right Fem-Pop  . Aortogram w/ ptca  09/292003  . Eye surgery    . Back surgery    . Total knee arthroplasty    . Cholecystectomy    . Cardiac catheterization  05/27/10    severe CAD left cx  . Abdoninal ao angio & bifem angio  05/27/10    Patent graft, occluded bil stents with no retrograde flow into the hypogastric arteries. 100% occl left ant. tibial artery, 70% to 80% to 100% stenosis right superficial fem artery above adductor canal. 100 % occl right ant. tibial vessell   Family History  Problem Relation Age of Onset  . Heart attack Mother   . Cirrhosis Father   . Heart failure Mother   . Heart failure Brother   . Cancer Brother    History  Substance Use Topics  . Smoking status: Current Every Day Smoker -- 1.50 packs/day for 45 years    Types: Cigarettes  . Smokeless tobacco: Never Used     Comment: refuses information  . Alcohol Use: Yes  Comment: occ    Review of Systems  Constitutional: Positive for fatigue. Negative for fever, chills and unexpected weight change.  Respiratory: Positive for cough and shortness of breath. Negative for apnea, choking and chest tightness.   Cardiovascular: Positive for chest pain and leg swelling.  Gastrointestinal: Negative for nausea, vomiting, abdominal pain, diarrhea, constipation and blood in stool.  Genitourinary: Negative for hematuria and difficulty urinating.  Musculoskeletal: Positive for back pain and joint swelling.     Allergies  Darvocet and Haldol  Home Medications   Current Outpatient Rx  Name  Route  Sig  Dispense  Refill  . albuterol (PROVENTIL HFA;VENTOLIN HFA) 108 (90 BASE) MCG/ACT inhaler   Inhalation   Inhale 2 puffs into the lungs every 6 (six) hours as needed for wheezing or shortness of breath.         . allopurinol (ZYLOPRIM) 300 MG  tablet   Oral   Take 300 mg by mouth daily.         [name] omeprazole (PRILOSEC) 20 MG capsule   Oral   Take 20 mg by mouth daily.          [name] tiotropium (SPIRIVA) 18 MCG inhalation capsule   Inhalation   Place 18 mcg into inhaler and inhale daily.          BP 98/53  Pulse 98  Temp(Src) 98.2 F (36.8 C) (Oral)  Resp 28  SpO2 99% Physical Exam  Constitutional: He is oriented to person, place, and time. He appears well-developed and well-nourished. No distress.  HENT:  Head: Normocephalic and atraumatic.  Mouth/Throat: Uvula is midline, oropharynx is clear and moist and mucous membranes are normal.  No obvious source of bleeding  Eyes: Conjunctivae are normal. Pupils are equal, round, and reactive to light.  Neck: Normal range of motion. Neck supple.  Cardiovascular: Normal rate and regular rhythm.   Distant heart sounds.  Pulmonary/Chest: He has wheezes (mild mainly basilar).  Tachypneic.  Abdominal: Soft. Bowel sounds are normal. He exhibits no distension. There is tenderness (RUQ).  Neurological: He is alert and oriented to person, place, and time.  Skin: He is not diaphoretic.  1+ b/l LE pitting edema    ED Course  Procedures (including critical care time) Labs Review Labs Reviewed  BASIC METABOLIC PANEL - Abnormal; Notable for the following:    Glucose, Bld 119 (*)    GFR calc non Af Amer 68 (*)    GFR calc Af Amer 79 (*)    All other components within normal limits  ETHANOL - Abnormal; Notable for the following:    Alcohol, Ethyl (B) 202 (*)    All other components within normal limits  CBC  PRO B NATRIURETIC PEPTIDE  POCT I-STAT TROPONIN I  TYPE AND SCREEN   FOBT: negative  Imaging Review Dg Chest 2 View  12/10/2012   CLINICAL DATA:  Short of breath. Hemoptysis.  EXAM: CHEST  2 VIEW  COMPARISON:  09/11/2012.  FINDINGS: Stable enlargement of the cardiopericardial silhouette compared to prior. Mediastinal contours are within normal limits. Chronic pleural  thickening is present along the right lateral chest. There is a fine interstitial pattern and the right lung. This may represent pneumonia or asymmetric pulmonary edema. Asymmetric pulmonary edema is favored. Emphysema is present with flattening of the hemidiaphragms. Surgical clips are identified in the upper abdomen.  IMPRESSION: Cardiomegaly with interstitial prominence in the right chest, likely representing asymmetric interstitial pulmonary edema. Infection is considered less likely.   Electronically Signed  By: Andreas Newport M.D.   On: 12/10/2012 10:28   Ct Angio Chest Pe W/cm &/or Wo Cm  12/10/2012   CLINICAL DATA:  Acute onset chest pain, shortness of breath, and hemoptysis.  EXAM: CT ANGIOGRAPHY CHEST WITH CONTRAST  TECHNIQUE: Multidetector CT imaging of the chest was performed using the standard protocol during bolus administration of intravenous contrast. Multiplanar CT image reconstructions including MIPs were obtained to evaluate the vascular anatomy.  CONTRAST:  80mL OMNIPAQUE IOHEXOL 350 MG/ML SOLN  COMPARISON:  08/27/2012  FINDINGS: Satisfactory opacification of pulmonary arteries noted, and there is no evidence of pulmonary emboli. No evidence of thoracic aortic dissection or aneurysm. No evidence of mediastinal hematoma or mass. No adenopathy seen within the thorax. Small hiatal hernia is noted.  The no evidence of pleural or pericardial effusion. New mild airspace disease is seen in the peripheral right upper and lower lobes, suspicious for pneumonia. No suspicious pulmonary nodules or masses are identified. There is no evidence of central endobronchial obstruction.  Review of the MIP images confirms the above findings.  IMPRESSION: No evidence of pulmonary embolism.  Mild asymmetric airspace disease in right upper and lower lobes, consistent with infection, hemorrhage, or other inflammatory process.  No mass or lymphadenopathy identified.  Small hiatal hernia.   Electronically Signed   By:  Myles Rosenthal M.D.   On: 12/10/2012 13:22    EKG Interpretation     Ventricular Rate:  97 PR Interval:  168 QRS Duration: 93 QT Interval:  375 QTC Calculation: 476 R Axis:   30 Text Interpretation:  Sinus rhythm Nonspecific T abnormalities, lateral leads Borderline prolonged QT interval           .  MDM  No diagnosis found.  Hemoptysis:  Differential is broad to include infectious, GI, Neoplasm, Cardiovascular (including PE).  Pt was hypotensive at 98/53 and was given a NS bolus and his BP improved. He had a very small amount of hemoptysis in the ED.  ETOH level was 202.  CMP and CBC were unremarkable.  CXR revealed right interstitial pulmonary edema. CTA shows no PE.  Most likely viral URI.  Pt does not wish to be admitted.  We will send him home with antibiotics and close follow up with PCP.    Boykin Peek, MD 12/11/12 1542  Medical screening examination/treatment/procedure(s) were conducted as a shared visit with non-physician practitioner(s) or resident and myself. I personally evaluated the patient during the encounter and agree with the findings and plan unless otherwise indicated.  I have personally reviewed any xrays and/ or EKG's with the provider and I agree with interpretation.  Worsening cough for a few days, productive, green sputum with worsening blood- initially specks but then clots. SOB and chest pain for 2-3 days, worse with coughing and breathing. Pt chronic etoh. No known liver issues, denies blood in stool. DVT hx, no current blood thinners. Vascular dz hx. No known TB exposures.  Exam few exp wheezes upper and few rales at bases, legs swollen bilateral, no distress, mild dry mm, abd distended/ NT.  Differential pneumonia vs PE vs other. Plan for labs, CXR and recheck.  Pt improved in ED.  CT showed hemorrhage vs infection.  Pt does not want admission at this time, he has capacity to make decisions and will fup outpt closely and reasons to return  given.  Hemoptysis, Cough, Bronchitis   Enid Skeens, MD 12/11/12 1752

## 2012-12-10 NOTE — ED Notes (Signed)
Pt has history of copd/asthma and here with chest pain, coughing up blood and shortness of breath

## 2012-12-10 NOTE — ED Notes (Signed)
Patient transported to X-ray 

## 2012-12-12 ENCOUNTER — Emergency Department (HOSPITAL_COMMUNITY): Payer: Medicaid Other

## 2012-12-12 ENCOUNTER — Encounter (HOSPITAL_COMMUNITY): Payer: Self-pay | Admitting: Emergency Medicine

## 2012-12-12 ENCOUNTER — Emergency Department (HOSPITAL_COMMUNITY)
Admission: EM | Admit: 2012-12-12 | Discharge: 2012-12-12 | Disposition: A | Discharge status: Home / Self Care | Payer: Medicaid Other | Attending: Emergency Medicine | Admitting: Emergency Medicine

## 2012-12-12 DIAGNOSIS — R142 Eructation: Secondary | ICD-10-CM | POA: Insufficient documentation

## 2012-12-12 DIAGNOSIS — Z79899 Other long term (current) drug therapy: Secondary | ICD-10-CM | POA: Insufficient documentation

## 2012-12-12 DIAGNOSIS — Z885 Allergy status to narcotic agent status: Secondary | ICD-10-CM | POA: Insufficient documentation

## 2012-12-12 DIAGNOSIS — F3289 Other specified depressive episodes: Secondary | ICD-10-CM | POA: Insufficient documentation

## 2012-12-12 DIAGNOSIS — W19XXXA Unspecified fall, initial encounter: Secondary | ICD-10-CM | POA: Insufficient documentation

## 2012-12-12 DIAGNOSIS — Y939 Activity, unspecified: Secondary | ICD-10-CM | POA: Insufficient documentation

## 2012-12-12 DIAGNOSIS — S01109A Unspecified open wound of unspecified eyelid and periocular area, initial encounter: Secondary | ICD-10-CM | POA: Insufficient documentation

## 2012-12-12 DIAGNOSIS — I2510 Atherosclerotic heart disease of native coronary artery without angina pectoris: Secondary | ICD-10-CM | POA: Insufficient documentation

## 2012-12-12 DIAGNOSIS — I252 Old myocardial infarction: Secondary | ICD-10-CM | POA: Insufficient documentation

## 2012-12-12 DIAGNOSIS — F1010 Alcohol abuse, uncomplicated: Secondary | ICD-10-CM | POA: Insufficient documentation

## 2012-12-12 DIAGNOSIS — S0003XA Contusion of scalp, initial encounter: Secondary | ICD-10-CM | POA: Insufficient documentation

## 2012-12-12 DIAGNOSIS — H11429 Conjunctival edema, unspecified eye: Secondary | ICD-10-CM | POA: Insufficient documentation

## 2012-12-12 DIAGNOSIS — K219 Gastro-esophageal reflux disease without esophagitis: Secondary | ICD-10-CM | POA: Insufficient documentation

## 2012-12-12 DIAGNOSIS — W102XXA Fall (on)(from) incline, initial encounter: Secondary | ICD-10-CM

## 2012-12-12 DIAGNOSIS — G4733 Obstructive sleep apnea (adult) (pediatric): Secondary | ICD-10-CM | POA: Insufficient documentation

## 2012-12-12 DIAGNOSIS — F329 Major depressive disorder, single episode, unspecified: Secondary | ICD-10-CM | POA: Insufficient documentation

## 2012-12-12 DIAGNOSIS — R5381 Other malaise: Secondary | ICD-10-CM | POA: Insufficient documentation

## 2012-12-12 DIAGNOSIS — R141 Gas pain: Secondary | ICD-10-CM | POA: Insufficient documentation

## 2012-12-12 DIAGNOSIS — E785 Hyperlipidemia, unspecified: Secondary | ICD-10-CM | POA: Insufficient documentation

## 2012-12-12 DIAGNOSIS — S0280XA Fracture of other specified skull and facial bones, unspecified side, initial encounter for closed fracture: Secondary | ICD-10-CM | POA: Insufficient documentation

## 2012-12-12 DIAGNOSIS — J4489 Other specified chronic obstructive pulmonary disease: Secondary | ICD-10-CM | POA: Insufficient documentation

## 2012-12-12 DIAGNOSIS — J45909 Unspecified asthma, uncomplicated: Secondary | ICD-10-CM | POA: Insufficient documentation

## 2012-12-12 DIAGNOSIS — I10 Essential (primary) hypertension: Secondary | ICD-10-CM | POA: Insufficient documentation

## 2012-12-12 DIAGNOSIS — Z888 Allergy status to other drugs, medicaments and biological substances status: Secondary | ICD-10-CM | POA: Insufficient documentation

## 2012-12-12 DIAGNOSIS — M129 Arthropathy, unspecified: Secondary | ICD-10-CM | POA: Insufficient documentation

## 2012-12-12 DIAGNOSIS — Z86718 Personal history of other venous thrombosis and embolism: Secondary | ICD-10-CM | POA: Insufficient documentation

## 2012-12-12 DIAGNOSIS — J449 Chronic obstructive pulmonary disease, unspecified: Secondary | ICD-10-CM | POA: Insufficient documentation

## 2012-12-12 DIAGNOSIS — F411 Generalized anxiety disorder: Secondary | ICD-10-CM | POA: Insufficient documentation

## 2012-12-12 DIAGNOSIS — F17200 Nicotine dependence, unspecified, uncomplicated: Secondary | ICD-10-CM | POA: Insufficient documentation

## 2012-12-12 DIAGNOSIS — Y929 Unspecified place or not applicable: Secondary | ICD-10-CM | POA: Insufficient documentation

## 2012-12-12 DIAGNOSIS — Z8701 Personal history of pneumonia (recurrent): Secondary | ICD-10-CM | POA: Insufficient documentation

## 2012-12-12 DIAGNOSIS — I509 Heart failure, unspecified: Secondary | ICD-10-CM | POA: Insufficient documentation

## 2012-12-12 DIAGNOSIS — R040 Epistaxis: Secondary | ICD-10-CM | POA: Insufficient documentation

## 2012-12-12 DIAGNOSIS — F10929 Alcohol use, unspecified with intoxication, unspecified: Secondary | ICD-10-CM

## 2012-12-12 DIAGNOSIS — I739 Peripheral vascular disease, unspecified: Secondary | ICD-10-CM | POA: Insufficient documentation

## 2012-12-12 DIAGNOSIS — Z8709 Personal history of other diseases of the respiratory system: Secondary | ICD-10-CM | POA: Insufficient documentation

## 2012-12-12 LAB — POCT I-STAT 3, VENOUS BLOOD GAS (G3P V)
Bicarbonate: 27.4 mEq/L — ABNORMAL HIGH (ref 20.0–24.0)
TCO2: 29 mmol/L (ref 0–100)
pCO2, Ven: 52.1 mmHg — ABNORMAL HIGH (ref 45.0–50.0)
pH, Ven: 7.33 — ABNORMAL HIGH (ref 7.250–7.300)
pO2, Ven: 128 mmHg — ABNORMAL HIGH (ref 30.0–45.0)

## 2012-12-12 LAB — COMPREHENSIVE METABOLIC PANEL
ALT: 21 U/L (ref 0–53)
AST: 25 U/L (ref 0–37)
Albumin: 3.9 g/dL (ref 3.5–5.2)
Alkaline Phosphatase: 86 U/L (ref 39–117)
BUN: 10 mg/dL (ref 6–23)
Chloride: 107 mEq/L (ref 96–112)
GFR calc non Af Amer: >90 mL/min (ref >90)
Glucose, Bld: 132 mg/dL — ABNORMAL HIGH (ref 70–99)
Potassium: 4.3 mEq/L (ref 3.5–5.1)
Sodium: 148 mEq/L — ABNORMAL HIGH (ref 135–145)
Total Protein: 7.8 g/dL (ref 6.0–8.3)

## 2012-12-12 LAB — CBC WITH DIFFERENTIAL/PLATELET
Basophils Absolute: 0 10*3/uL (ref 0.0–0.1)
Basophils Relative: 1 % (ref 0–1)
Eosinophils Absolute: 0.1 10*3/uL (ref 0.0–0.7)
Hemoglobin: 17.1 g/dL — ABNORMAL HIGH (ref 13.0–17.0)
Lymphs Abs: 1.8 10*3/uL (ref 0.7–4.0)
MCH: 32.1 pg (ref 26.0–34.0)
MCHC: 34.1 g/dL (ref 30.0–36.0)
Monocytes Relative: 5 % (ref 3–12)
Neutro Abs: 5.5 10*3/uL (ref 1.7–7.7)
Neutrophils Relative %: 71 % (ref 43–77)
Platelets: 184 10*3/uL (ref 150–400)
RDW: 14.4 % (ref 11.5–15.5)

## 2012-12-12 LAB — LACTIC ACID, PLASMA: Lactic Acid, Venous: 2.5 mmol/L — ABNORMAL HIGH (ref 0.5–2.2)

## 2012-12-12 LAB — RAPID URINE DRUG SCREEN, HOSP PERFORMED
Barbiturates: NOT DETECTED
Benzodiazepines: NOT DETECTED
Cocaine: NOT DETECTED
Opiates: NOT DETECTED

## 2012-12-12 LAB — POCT I-STAT TROPONIN I: Troponin i, poc: 0 ng/mL (ref 0.00–0.08)

## 2012-12-12 LAB — PROTIME-INR
INR: 0.91 (ref 0.00–1.49)
Prothrombin Time: 12.1 seconds (ref 11.6–15.2)

## 2012-12-12 LAB — OCCULT BLOOD, POC DEVICE: Fecal Occult Bld: NEGATIVE

## 2012-12-12 LAB — PRO B NATRIURETIC PEPTIDE: Pro B Natriuretic peptide (BNP): 24.1 pg/mL (ref 0–125)

## 2012-12-12 LAB — URINALYSIS, ROUTINE W REFLEX MICROSCOPIC
Nitrite: NEGATIVE
Specific Gravity, Urine: 1.015 (ref 1.005–1.030)
Urobilinogen, UA: 0.2 mg/dL (ref 0.0–1.0)
pH: 5.5 (ref 5.0–8.0)

## 2012-12-12 LAB — ETHANOL: Alcohol, Ethyl (B): 262 mg/dL — ABNORMAL HIGH (ref 0–11)

## 2012-12-12 LAB — LIPASE, BLOOD: Lipase: 15 U/L (ref 11–59)

## 2012-12-12 LAB — URINE MICROSCOPIC-ADD ON

## 2012-12-12 LAB — TYPE AND SCREEN
ABO/RH(D): O POS
Antibody Screen: NEGATIVE

## 2012-12-12 MED ORDER — IOHEXOL 300 MG/ML  SOLN
100.0000 mL | Freq: Once | INTRAMUSCULAR | Status: AC | PRN
  Administered 2012-12-12: 100 mL via INTRAVENOUS

## 2012-12-12 MED ORDER — OXYMETAZOLINE HCL 0.05 % NA SOLN: 2.0000 | Freq: Two times a day (BID) | NASAL | Status: DC

## 2012-12-12 MED ORDER — ONDANSETRON HCL 4 MG/2ML IJ SOLN
4.0000 mg | Freq: Once | INTRAMUSCULAR | Status: AC
  Administered 2012-12-12: 4 mg via INTRAVENOUS
  Filled 2012-12-12: qty 2

## 2012-12-12 MED ORDER — CEPHALEXIN 500 MG PO CAPS: 500.0000 mg | ORAL_CAPSULE | Freq: Four times a day (QID) | ORAL | Status: DC

## 2012-12-12 NOTE — ED Notes (Signed)
Cell phone,  pants, black wallet with 5 dollars, keys remain with pt

## 2012-12-12 NOTE — ED Notes (Signed)
Per EMS- pt was found by caregiver on the floor with laceration to left eye. Caregiver had only left pt for approx 5 minutes. Pt drank approx 0.5 gallons of vodka today. Pt had GCS of 14 with EMS. Pt rated pain in face at 6/10 with EMS. Pt denied neck or back pain with EMS as well. Pt was seen here on Saturday adn left AMA with a diagnosis of pneumonia. Pt noted to have blood in mouth unable to visualize source.

## 2012-12-12 NOTE — ED Notes (Signed)
Patient transported to CT 

## 2012-12-12 NOTE — ED Provider Notes (Signed)
Care assumed from Dr.Masneri at shift change. Patient was evaluated here after a fall down stairs. He was found to have orbital fractures for which arrangements have been made for him to follow up with both ENT and ophthalmology. He was signed out to my care as he was intoxicated waiting to sober up and have his cervical spine cleared. He is having no neck tenderness and he has painless range of motion in all directions. He is now much more awake, alert, and I feel as though he is stable for discharge. Followup instructions will be given to him and he is to return to the emergency department if he develops further problems.  Geoffery Lyons, MD 12/12/12 [phone]

## 2012-12-12 NOTE — ED Provider Notes (Signed)
CSN: [id number]     Arrival date & time 12/12/12  1241 History   First MD Initiated Contact with Patient 12/12/12 1315     Chief Complaint  Patient presents with  . Fall   (Consider location/radiation/quality/duration/timing/severity/associated sxs/prior Treatment) HPI Comments: 62 yo wm with h/op HTN, MI, CHF, COPD, EtOH abuse, PVD, h/o DVT, DVT, OSA, DM presents with cc of fall and CHI. Pt suspected to have EtOH ingestion today per EMS.  Pt has recent h/o pna and left AMA 2 days ago.    Pt c/o pain to L face and head.    Patient is a 62 y.o. male presenting with fall. The history is provided by the patient and the EMS personnel. The history is limited by the condition of the patient (Pt is intoxicated per EMS.  ).  Fall This is a new problem. The current episode started 3 to 5 hours ago. The problem occurs constantly. The problem has not changed since onset.Pertinent negatives include no chest pain, no abdominal pain, no headaches and no shortness of breath. Nothing aggravates the symptoms. He has tried nothing for the symptoms.    Past Medical History  Diagnosis Date  . Depression   . Hypertension   . Hyperlipidemia   . Myocardial infarction 2000  . CHF (congestive heart failure)   . COPD (chronic obstructive pulmonary disease)   . Peripheral vascular disease, unspecified     08/20/10 doppler: increase in right ABI post-op. Left ABI stable. S/P bi-fem bypass surgery  . GERD (gastroesophageal reflux disease)   . Asthma   . Anxiety   . History of DVT of lower extremity   . CAD (coronary artery disease) 05/27/10    Cath: severe single vessell CAD left cx midportion obtuse marginal 2 to 3.  . Shortness of breath   . Pneumonia 04/04/2012  . Arthritis   . OSA on CPAP   . Hyperlipemia   . COPD (chronic obstructive pulmonary disease)   . Alcohol abuse     H/O  . Tobacco abuse    Past Surgical History  Procedure Laterality Date  . Femoral bypass  08/19/10    Right Fem-Pop  .  Aortogram w/ ptca  09/292003  . Eye surgery    . Back surgery    . Total knee arthroplasty    . Cholecystectomy    . Cardiac catheterization  05/27/10    severe CAD left cx  . Abdoninal ao angio & bifem angio  05/27/10    Patent graft, occluded bil stents with no retrograde flow into the hypogastric arteries. 100% occl left ant. tibial artery, 70% to 80% to 100% stenosis right superficial fem artery above adductor canal. 100 % occl right ant. tibial vessell   Family History  Problem Relation Age of Onset  . Heart attack Mother   . Cirrhosis Father   . Heart failure Mother   . Heart failure Brother   . Cancer Brother    History  Substance Use Topics  . Smoking status: Current Every Day Smoker -- 1.50 packs/day for 45 years    Types: Cigarettes  . Smokeless tobacco: Never Used     Comment: refuses information  . Alcohol Use: Yes     Comment: occ    Review of Systems  Respiratory: Negative for shortness of breath.   Cardiovascular: Negative for chest pain.  Gastrointestinal: Negative for abdominal pain.  Neurological: Negative for headaches.    Allergies  Darvocet and Haldol  Home Medications  Current Outpatient Rx  Name  Route  Sig  Dispense  Refill  . allopurinol (ZYLOPRIM) 300 MG tablet   Oral   Take 300 mg by mouth daily.         [name] omeprazole (PRILOSEC) 20 MG capsule   Oral   Take 20 mg by mouth daily.          [name] tiotropium (SPIRIVA) 18 MCG inhalation capsule   Inhalation   Place 18 mcg into inhaler and inhale daily.         [name] albuterol (PROVENTIL HFA;VENTOLIN HFA) 108 (90 BASE) MCG/ACT inhaler   Inhalation   Inhale 2 puffs into the lungs every 6 (six) hours as needed for wheezing or shortness of breath.          BP 148/102  Pulse 100  Temp(Src) 97.6 F (36.4 C) (Oral)  Resp 22  SpO2 97% Physical Exam  Constitutional: He appears lethargic. He appears distressed.  HENT:  Head: Not macrocephalic and not microcephalic. Head is with abrasion,  with contusion, with laceration and with right periorbital erythema. Head is without Battle's sign. Hair is normal.    Nose: Sinus tenderness and nasal deformity present. No mucosal edema, rhinorrhea, nose lacerations, septal deviation or nasal septal hematoma. Epistaxis is observed.  No foreign bodies.    Spitting up scant amount of blood; no active hemorrhage noted.  Eyes: EOM are normal. Pupils are equal, round, and reactive to light. Left eye exhibits chemosis. Left eye exhibits no exudate and no hordeolum. No foreign body present in the left eye. Left conjunctiva is injected. Left conjunctiva has no hemorrhage. Right eye exhibits normal extraocular motion and no nystagmus. Left eye exhibits normal extraocular motion and no nystagmus.    Neck: Trachea normal and normal range of motion. Normal carotid pulses, no hepatojugular reflux and no JVD present. No tracheal tenderness present. Carotid bruit is not present. No tracheal deviation present. No mass and no thyromegaly present.  Cardiovascular: Normal rate and regular rhythm.   Pulmonary/Chest: Effort normal and breath sounds normal. No stridor. No respiratory distress. He has no wheezes. He has no rales. He exhibits no tenderness.  Abdominal: Soft. Bowel sounds are normal. He exhibits distension. He exhibits no mass. There is no tenderness. There is no rebound and no guarding.  protuberent abd; likely fluid wave  Musculoskeletal: Normal range of motion. He exhibits no edema and no tenderness.  Stable pelvis, No ttp, crepitus, or stepoffs on ROM of extremities.  Neurological: He appears lethargic. He is disoriented. No sensory deficit. He exhibits normal muscle tone. GCS eye subscore is 4. GCS verbal subscore is 5. GCS motor subscore is 6.  Disoriented; verbal, GCS 15  Skin: Skin is warm and dry. He is not diaphoretic.    ED Course  Procedures (including critical care time) Labs Review Labs Reviewed  CBC WITH DIFFERENTIAL - Abnormal;  Notable for the following:    Hemoglobin 17.1 (*)    All other components within normal limits  COMPREHENSIVE METABOLIC PANEL - Abnormal; Notable for the following:    Sodium 148 (*)    Glucose, Bld 132 (*)    Total Bilirubin 0.2 (*)    All other components within normal limits  ETHANOL - Abnormal; Notable for the following:    Alcohol, Ethyl (B) 262 (*)    All other components within normal limits  LACTIC ACID, PLASMA - Abnormal; Notable for the following:    Lactic Acid, Venous 2.5 (*)    All other  components within normal limits  URINALYSIS, ROUTINE W REFLEX MICROSCOPIC - Abnormal; Notable for the following:    Hgb urine dipstick SMALL (*)    Protein, ur 30 (*)    All other components within normal limits  URINE MICROSCOPIC-ADD ON - Abnormal; Notable for the following:    Casts GRANULAR CAST (*)    All other components within normal limits  URINE RAPID DRUG SCREEN (HOSP PERFORMED)  LIPASE, BLOOD  PRO B NATRIURETIC PEPTIDE  PROTIME-INR  AMMONIA  BLOOD GAS, VENOUS  POCT I-STAT TROPONIN I  TYPE AND SCREEN   Imaging Review Ct Head Wo Contrast  12/12/2012   CLINICAL DATA:  Fall.  Laceration to left eye.  EXAM: CT HEAD WITHOUT CONTRAST  CT CERVICAL SPINE WITHOUT CONTRAST  TECHNIQUE: Multidetector CT imaging of the head and cervical spine was performed following the standard protocol without intravenous contrast. Multiplanar CT image reconstructions of the cervical spine were also generated.  COMPARISON:  Head CT 01/25/2008.  FINDINGS: CT HEAD FINDINGS  The ventricles, cisterns and other CSF spaces are within normal. There is no mass, mass effect, shift of midline structures or acute hemorrhage. No evidence to suggest acute ischemia. Minimal asymmetry of the sylvian fissures left greater than right unchanged. There soft tissue swelling over the left periorbital region. There is mild increased density within the left retrobulbar are space likely hemorrhage. There is a comminuted displaced  fracture of the medial wall of the left orbit and possible left orbital floor fracture the with moderate hemorrhagic debris within the left maxillary sinus and adjacent ethmoid sinus.  CT CERVICAL SPINE FINDINGS  The vertebral body alignment and heights are within normal. There is partial fusion of C2 and C3. There is disc space narrowing at the C2-3 level and C6-7 level. There is mild to moderate spondylosis throughout the cervical spine. Prevertebral soft tissues are within normal. The atlantoaxial articulation is unremarkable. There is uncovertebral joint spurring and facet arthropathy. There is no acute fracture or subluxation.  IMPRESSION: No acute intracranial findings.  No acute cervical spine injury.  Displaced and comminuted fracture of the medial wall of the left orbit as well as possible left orbital floor fracture. Associated left periorbital soft tissue swelling with left retrobulbar hemorrhage and hemorrhagic debris within the left maxillary and ethmoid sinuses.  Spondylosis of the cervical spine with degenerative disc disease as described. Partial fusion of C2 and C3.   Electronically Signed   By: Elberta Fortis M.D.   On: 12/12/2012 15:01   Ct Chest W Contrast  12/12/2012   CLINICAL DATA:  Fall with left side trauma.  EXAM: CT CHEST, ABDOMEN, AND PELVIS WITH CONTRAST  TECHNIQUE: Multidetector CT imaging of the chest, abdomen and pelvis was performed following the standard protocol during bolus administration of intravenous contrast.  CONTRAST:  OMNIPAQUE IOHEXOL 300 MG/ML  SOLN  COMPARISON:  CT chest 12/10/2012, 08/1912 and CT abdomen/ pelvis 03/14/2012.  FINDINGS: CT CHEST FINDINGS  Lungs are adequately inflated with mild dependent atelectasis over the posterior right base. There is a calcified granuloma over the anterior right upper lobe. The heart is unremarkable. There is mild calcification of the thoracic aorta as remaining mediastinal structures are unremarkable. There are a few small  calcified right hilar lymph nodes. There is an old right lateral rib fracture.  CT ABDOMEN AND PELVIS FINDINGS  Abdominal images demonstrate evidence of a prior cholecystectomy. There is minimal fatty infiltration of the liver. There is a sub cm left adrenal nodule likely  an adenoma but too small to characterize. Kidneys are normal in size without evidence of hydronephrosis or focal mass. There are a few calcifications over the kidneys likely vascular. There is mild calcified plaque involving the aorta and iliac vessels. There is an intact patent aortobifemoral graft. Ureters are within normal. The appendix is unremarkable. The ascending colon is decompressed.  Remaining pelvic structures are unremarkable. Mild spondylosis of the spine.  IMPRESSION: No acute findings in the chest, abdomen or pelvis.  Right basilar atelectasis.  Evidence of prior granulomatous disease.  Mild fatty infiltration of the liver. Sub cm left adrenal nodule unchanged likely an adenoma.  Patent aortobifemoral bypass graft.   Electronically Signed   By: Elberta Fortis M.D.   On: 12/12/2012 15:25   Ct Cervical Spine Wo Contrast  12/12/2012   CLINICAL DATA:  Fall.  Laceration to left eye.  EXAM: CT HEAD WITHOUT CONTRAST  CT CERVICAL SPINE WITHOUT CONTRAST  TECHNIQUE: Multidetector CT imaging of the head and cervical spine was performed following the standard protocol without intravenous contrast. Multiplanar CT image reconstructions of the cervical spine were also generated.  COMPARISON:  Head CT 01/25/2008.  FINDINGS: CT HEAD FINDINGS  The ventricles, cisterns and other CSF spaces are within normal. There is no mass, mass effect, shift of midline structures or acute hemorrhage. No evidence to suggest acute ischemia. Minimal asymmetry of the sylvian fissures left greater than right unchanged. There soft tissue swelling over the left periorbital region. There is mild increased density within the left retrobulbar are space likely hemorrhage.  There is a comminuted displaced fracture of the medial wall of the left orbit and possible left orbital floor fracture the with moderate hemorrhagic debris within the left maxillary sinus and adjacent ethmoid sinus.  CT CERVICAL SPINE FINDINGS  The vertebral body alignment and heights are within normal. There is partial fusion of C2 and C3. There is disc space narrowing at the C2-3 level and C6-7 level. There is mild to moderate spondylosis throughout the cervical spine. Prevertebral soft tissues are within normal. The atlantoaxial articulation is unremarkable. There is uncovertebral joint spurring and facet arthropathy. There is no acute fracture or subluxation.  IMPRESSION: No acute intracranial findings.  No acute cervical spine injury.  Displaced and comminuted fracture of the medial wall of the left orbit as well as possible left orbital floor fracture. Associated left periorbital soft tissue swelling with left retrobulbar hemorrhage and hemorrhagic debris within the left maxillary and ethmoid sinuses.  Spondylosis of the cervical spine with degenerative disc disease as described. Partial fusion of C2 and C3.   Electronically Signed   By: Elberta Fortis M.D.   On: 12/12/2012 15:01   Ct Abdomen Pelvis W Contrast  12/12/2012   CLINICAL DATA:  Fall with left side trauma.  EXAM: CT CHEST, ABDOMEN, AND PELVIS WITH CONTRAST  TECHNIQUE: Multidetector CT imaging of the chest, abdomen and pelvis was performed following the standard protocol during bolus administration of intravenous contrast.  CONTRAST:  OMNIPAQUE IOHEXOL 300 MG/ML  SOLN  COMPARISON:  CT chest 12/10/2012, 08/1912 and CT abdomen/ pelvis 03/14/2012.  FINDINGS: CT CHEST FINDINGS  Lungs are adequately inflated with mild dependent atelectasis over the posterior right base. There is a calcified granuloma over the anterior right upper lobe. The heart is unremarkable. There is mild calcification of the thoracic aorta as remaining mediastinal structures  are unremarkable. There are a few small calcified right hilar lymph nodes. There is an old right lateral rib fracture.  CT ABDOMEN AND PELVIS FINDINGS  Abdominal images demonstrate evidence of a prior cholecystectomy. There is minimal fatty infiltration of the liver. There is a sub cm left adrenal nodule likely an adenoma but too small to characterize. Kidneys are normal in size without evidence of hydronephrosis or focal mass. There are a few calcifications over the kidneys likely vascular. There is mild calcified plaque involving the aorta and iliac vessels. There is an intact patent aortobifemoral graft. Ureters are within normal. The appendix is unremarkable. The ascending colon is decompressed.  Remaining pelvic structures are unremarkable. Mild spondylosis of the spine.  IMPRESSION: No acute findings in the chest, abdomen or pelvis.  Right basilar atelectasis.  Evidence of prior granulomatous disease.  Mild fatty infiltration of the liver. Sub cm left adrenal nodule unchanged likely an adenoma.  Patent aortobifemoral bypass graft.   Electronically Signed   By: Elberta Fortis M.D.   On: 12/12/2012 15:25   Dg Pelvis Portable  12/12/2012   CLINICAL DATA:  Fall.  EXAM: PORTABLE PELVIS 1-2 VIEWS  COMPARISON:  CT of 08/10/2012  FINDINGS: AP view of the pelvis. Surgical changes in both groins. Femoral heads are located. Vascular stent in the right common and likely left common iliac distribution. Extensive vascular calcifications.  No acute fracture.  IMPRESSION: No acute osseous abnormality. .   Electronically Signed   By: Jeronimo Greaves M.D.   On: 12/12/2012 13:50   Dg Chest Port 1 View  12/12/2012   CLINICAL DATA:  Laceration to left side.  Ethanol use.  EXAM: PORTABLE CHEST - 1 VIEW  COMPARISON:  CT 12/10/2012. Plain film 12/10/2012.  FINDINGS: Midline trachea. Cardiomegaly accentuated by AP portable technique. No a right and no definite left pleural effusion. Low lung volumes with resultant pulmonary  interstitial prominence. Given differences in technique, similar right infrahilar and medial lung base airspace disease. Worsened left base aeration which could be related to developing infection or atelectasis.  IMPRESSION: No acute or posttraumatic deformity identified. Cardiomegaly with similar right-sided basilar opacity. Favor atelectasis at the left lung base. Consider radiographic followup to exclude developing early infection.   Electronically Signed   By: Jeronimo Greaves M.D.   On: 12/12/2012 13:49    EKG Interpretation   None      Results for orders placed during the hospital encounter of 12/12/12  CBC WITH DIFFERENTIAL      Result Value Range   WBC 7.8  4.0 - 10.5 K/uL   RBC 5.32  4.22 - 5.81 MIL/uL   Hemoglobin 17.1 (*) 13.0 - 17.0 g/dL   HCT 40.9  81.1 - 91.4 %   MCV 94.2  78.0 - 100.0 fL   MCH 32.1  26.0 - 34.0 pg   MCHC 34.1  30.0 - 36.0 g/dL   RDW 78.2  95.6 - 21.3 %   Platelets 184  150 - 400 K/uL   Neutrophils Relative % 71  43 - 77 %   Neutro Abs 5.5  1.7 - 7.7 K/uL   Lymphocytes Relative 23  12 - 46 %   Lymphs Abs 1.8  0.7 - 4.0 K/uL   Monocytes Relative 5  3 - 12 %   Monocytes Absolute 0.4  0.1 - 1.0 K/uL   Eosinophils Relative 1  0 - 5 %   Eosinophils Absolute 0.1  0.0 - 0.7 K/uL   Basophils Relative 1  0 - 1 %   Basophils Absolute 0.0  0.0 - 0.1 K/uL  COMPREHENSIVE METABOLIC PANEL  Result Value Range   Sodium 148 (*) 135 - 145 mEq/L   Potassium 4.3  3.5 - 5.1 mEq/L   Chloride 107  96 - 112 mEq/L   CO2 28  19 - 32 mEq/L   Glucose, Bld 132 (*) 70 - 99 mg/dL   BUN 10  6 - 23 mg/dL   Creatinine, Ser 1.61  0.50 - 1.35 mg/dL   Calcium 9.8  8.4 - 09.6 mg/dL   Total Protein 7.8  6.0 - 8.3 g/dL   Albumin 3.9  3.5 - 5.2 g/dL   AST 25  0 - 37 U/L   ALT 21  0 - 53 U/L   Alkaline Phosphatase 86  39 - 117 U/L   Total Bilirubin 0.2 (*) 0.3 - 1.2 mg/dL   GFR calc non Af Amer >90  >90 mL/min   GFR calc Af Amer >90  >90 mL/min  ETHANOL      Result Value Range    Alcohol, Ethyl (B) 262 (*) 0 - 11 mg/dL  LACTIC ACID, PLASMA      Result Value Range   Lactic Acid, Venous 2.5 (*) 0.5 - 2.2 mmol/L  URINALYSIS, ROUTINE W REFLEX MICROSCOPIC      Result Value Range   Color, Urine YELLOW  YELLOW   APPearance CLEAR  CLEAR   Specific Gravity, Urine 1.015  1.005 - 1.030   pH 5.5  5.0 - 8.0   Glucose, UA NEGATIVE  NEGATIVE mg/dL   Hgb urine dipstick SMALL (*) NEGATIVE   Bilirubin Urine NEGATIVE  NEGATIVE   Ketones, ur NEGATIVE  NEGATIVE mg/dL   Protein, ur 30 (*) NEGATIVE mg/dL   Urobilinogen, UA 0.2  0.0 - 1.0 mg/dL   Nitrite NEGATIVE  NEGATIVE   Leukocytes, UA NEGATIVE  NEGATIVE  URINE RAPID DRUG SCREEN (HOSP PERFORMED)      Result Value Range   Opiates NONE DETECTED  NONE DETECTED   Cocaine NONE DETECTED  NONE DETECTED   Benzodiazepines NONE DETECTED  NONE DETECTED   Amphetamines NONE DETECTED  NONE DETECTED   Tetrahydrocannabinol NONE DETECTED  NONE DETECTED   Barbiturates NONE DETECTED  NONE DETECTED  LIPASE, BLOOD      Result Value Range   Lipase 15  11 - 59 U/L  PRO B NATRIURETIC PEPTIDE      Result Value Range   Pro B Natriuretic peptide (BNP) 24.1  0 - 125 pg/mL  PROTIME-INR      Result Value Range   Prothrombin Time 12.1  11.6 - 15.2 seconds   INR 0.91  0.00 - 1.49  URINE MICROSCOPIC-ADD ON      Result Value Range   RBC / HPF 0-2  <3 RBC/hpf   Casts GRANULAR CAST (*) NEGATIVE  POCT I-STAT TROPONIN I      Result Value Range   Troponin i, poc 0.00  0.00 - 0.08 ng/mL   Comment 3           TYPE AND SCREEN      Result Value Range   ABO/RH(D) O POS     Antibody Screen NEG     Sample Expiration 12/15/2012      MDM   1. Alcohol intoxication   2. Orbital wall fracture, closed, initial encounter   3. Fall (on)(from) incline, initial encounter    62 yo wm found down by care giver.  Pt with multiple medical problems.  Pt has trauma to L periorbital area.  EtOH 262.  Lactic acid 2.5.  Trauma Scans are negative except for L orbital  wall fracture.   3:15 VSS.  Awaiting CT C/A/P.  CT head reveals L orbital fracture.  No acute intracranial hemorrhage.    4:40 Consulted ENT Dr. Lazarus Salines who will see in clinic in 1 wk. Will add CT face and orbits to better assess.  Afrin and Keflex Rx given.   Consulted Ophtho Dr. Burgess Estelle who will see in clinic tomorrow at 1300.    Pt t/o to Dr. Judd Lien pending metabolism of EtOH and CT face result.     Darlys Gales, MD 12/12/12 1728

## 2013-01-03 DIAGNOSIS — H5712 Ocular pain, left eye: Secondary | ICD-10-CM | POA: Insufficient documentation

## 2013-01-03 DIAGNOSIS — H0520 Unspecified exophthalmos: Secondary | ICD-10-CM | POA: Insufficient documentation

## 2013-01-03 DIAGNOSIS — S0230XA Fracture of orbital floor, unspecified side, initial encounter for closed fracture: Secondary | ICD-10-CM | POA: Insufficient documentation

## 2013-01-03 DIAGNOSIS — H532 Diplopia: Secondary | ICD-10-CM | POA: Insufficient documentation

## 2013-01-03 HISTORY — DX: Ocular pain, left eye: H57.12

## 2013-01-03 HISTORY — DX: Fracture of orbital floor, unspecified side, initial encounter for closed fracture: S02.30XA

## 2013-01-03 HISTORY — DX: Diplopia: H53.2

## 2013-01-19 DIAGNOSIS — S02839A Fracture of medial orbital wall, unspecified side, initial encounter for closed fracture: Secondary | ICD-10-CM | POA: Insufficient documentation

## 2013-01-19 DIAGNOSIS — H05429 Enophthalmos due to trauma or surgery, unspecified eye: Secondary | ICD-10-CM | POA: Insufficient documentation

## 2013-01-19 DIAGNOSIS — S0230XA Fracture of orbital floor, unspecified side, initial encounter for closed fracture: Secondary | ICD-10-CM

## 2013-01-19 HISTORY — DX: Fracture of orbital floor, unspecified side, initial encounter for closed fracture: S02.30XA

## 2013-01-19 HISTORY — DX: Fracture of medial orbital wall, unspecified side, initial encounter for closed fracture: S02.839A

## 2013-01-19 HISTORY — DX: Enophthalmos due to trauma or surgery, unspecified eye: H05.429

## 2013-01-24 DIAGNOSIS — H029 Unspecified disorder of eyelid: Secondary | ICD-10-CM

## 2013-01-24 HISTORY — DX: Unspecified disorder of eyelid: H02.9

## 2013-02-15 ENCOUNTER — Ambulatory Visit: Payer: Medicaid Other | Admitting: Vascular Surgery

## 2013-02-15 ENCOUNTER — Encounter (HOSPITAL_COMMUNITY): Payer: Medicaid Other

## 2013-04-09 ENCOUNTER — Emergency Department (HOSPITAL_COMMUNITY): Payer: Medicaid Other

## 2013-04-09 ENCOUNTER — Encounter (HOSPITAL_COMMUNITY): Payer: Self-pay | Admitting: Emergency Medicine

## 2013-04-09 ENCOUNTER — Inpatient Hospital Stay (HOSPITAL_COMMUNITY)
Admission: EM | Admit: 2013-04-09 | Discharge: 2013-04-12 | DRG: 149 | Disposition: A | Discharge status: Home / Self Care | Payer: Medicaid Other | Attending: Family Medicine | Admitting: Family Medicine

## 2013-04-09 DIAGNOSIS — R42 Dizziness and giddiness: Principal | ICD-10-CM | POA: Diagnosis present

## 2013-04-09 DIAGNOSIS — J4489 Other specified chronic obstructive pulmonary disease: Secondary | ICD-10-CM | POA: Diagnosis present

## 2013-04-09 DIAGNOSIS — I5030 Unspecified diastolic (congestive) heart failure: Secondary | ICD-10-CM | POA: Diagnosis present

## 2013-04-09 DIAGNOSIS — H5702 Anisocoria: Secondary | ICD-10-CM | POA: Diagnosis present

## 2013-04-09 DIAGNOSIS — J449 Chronic obstructive pulmonary disease, unspecified: Secondary | ICD-10-CM | POA: Diagnosis present

## 2013-04-09 DIAGNOSIS — R0781 Pleurodynia: Secondary | ICD-10-CM

## 2013-04-09 DIAGNOSIS — I252 Old myocardial infarction: Secondary | ICD-10-CM

## 2013-04-09 DIAGNOSIS — Z96659 Presence of unspecified artificial knee joint: Secondary | ICD-10-CM

## 2013-04-09 DIAGNOSIS — R109 Unspecified abdominal pain: Secondary | ICD-10-CM | POA: Diagnosis present

## 2013-04-09 DIAGNOSIS — I739 Peripheral vascular disease, unspecified: Secondary | ICD-10-CM | POA: Diagnosis present

## 2013-04-09 DIAGNOSIS — F1020 Alcohol dependence, uncomplicated: Secondary | ICD-10-CM | POA: Diagnosis present

## 2013-04-09 DIAGNOSIS — Z66 Do not resuscitate: Secondary | ICD-10-CM | POA: Diagnosis present

## 2013-04-09 DIAGNOSIS — Z79899 Other long term (current) drug therapy: Secondary | ICD-10-CM

## 2013-04-09 DIAGNOSIS — F10929 Alcohol use, unspecified with intoxication, unspecified: Secondary | ICD-10-CM

## 2013-04-09 DIAGNOSIS — G4733 Obstructive sleep apnea (adult) (pediatric): Secondary | ICD-10-CM | POA: Diagnosis present

## 2013-04-09 DIAGNOSIS — E871 Hypo-osmolality and hyponatremia: Secondary | ICD-10-CM | POA: Diagnosis present

## 2013-04-09 DIAGNOSIS — I10 Essential (primary) hypertension: Secondary | ICD-10-CM | POA: Diagnosis present

## 2013-04-09 DIAGNOSIS — F1096 Alcohol use, unspecified with alcohol-induced persisting amnestic disorder: Secondary | ICD-10-CM | POA: Diagnosis present

## 2013-04-09 DIAGNOSIS — F1010 Alcohol abuse, uncomplicated: Secondary | ICD-10-CM

## 2013-04-09 DIAGNOSIS — F39 Unspecified mood [affective] disorder: Secondary | ICD-10-CM | POA: Diagnosis present

## 2013-04-09 DIAGNOSIS — I2510 Atherosclerotic heart disease of native coronary artery without angina pectoris: Secondary | ICD-10-CM | POA: Diagnosis present

## 2013-04-09 DIAGNOSIS — F17200 Nicotine dependence, unspecified, uncomplicated: Secondary | ICD-10-CM | POA: Diagnosis present

## 2013-04-09 LAB — BASIC METABOLIC PANEL
BUN: 16 mg/dL (ref 6–23)
CHLORIDE: 96 meq/L (ref 96–112)
CO2: 22 meq/L (ref 19–32)
CREATININE: 0.76 mg/dL (ref 0.50–1.35)
Calcium: 9.7 mg/dL (ref 8.4–10.5)
GFR calc non Af Amer: >90 mL/min (ref >90)
Glucose, Bld: 120 mg/dL — ABNORMAL HIGH (ref 70–99)
Potassium: 4.1 mEq/L (ref 3.7–5.3)
SODIUM: 133 meq/L — AB (ref 137–147)

## 2013-04-09 LAB — I-STAT TROPONIN, ED: Troponin i, poc: 0.01 ng/mL (ref 0.00–0.08)

## 2013-04-09 LAB — CBC
HCT: 44.7 % (ref 39.0–52.0)
Hemoglobin: 15.3 g/dL (ref 13.0–17.0)
MCH: 31 pg (ref 26.0–34.0)
MCHC: 34.2 g/dL (ref 30.0–36.0)
MCV: 90.5 fL (ref 78.0–100.0)
Platelets: 202 10*3/uL (ref 150–400)
RBC: 4.94 MIL/uL (ref 4.22–5.81)
RDW: 14.7 % (ref 11.5–15.5)
WBC: 11.3 10*3/uL — AB (ref 4.0–10.5)

## 2013-04-09 LAB — CBG MONITORING, ED: GLUCOSE-CAPILLARY: 104 mg/dL — AB (ref 70–99)

## 2013-04-09 MED ORDER — ALBUTEROL SULFATE (2.5 MG/3ML) 0.083% IN NEBU
5.0000 mg | INHALATION_SOLUTION | Freq: Once | RESPIRATORY_TRACT | Status: AC
  Administered 2013-04-09: 5 mg via RESPIRATORY_TRACT
  Filled 2013-04-09: qty 6

## 2013-04-09 NOTE — ED Notes (Signed)
Per EMS: pt c/o dizziness and shortness of breath. Dizziness onset was two weeks ago. Pt is a pack and a half a day smoker. Pt presents with dependent edema. Pt is A&Ox4, respirations equal and labored with movement, skin warm and dry.

## 2013-04-09 NOTE — ED Notes (Signed)
MD at bedside. 

## 2013-04-10 ENCOUNTER — Encounter (HOSPITAL_COMMUNITY): Payer: Self-pay | Admitting: *Deleted

## 2013-04-10 ENCOUNTER — Emergency Department (HOSPITAL_COMMUNITY): Payer: Medicaid Other

## 2013-04-10 DIAGNOSIS — F1010 Alcohol abuse, uncomplicated: Secondary | ICD-10-CM

## 2013-04-10 DIAGNOSIS — R42 Dizziness and giddiness: Principal | ICD-10-CM

## 2013-04-10 DIAGNOSIS — R079 Chest pain, unspecified: Secondary | ICD-10-CM

## 2013-04-10 DIAGNOSIS — I10 Essential (primary) hypertension: Secondary | ICD-10-CM

## 2013-04-10 LAB — COMPREHENSIVE METABOLIC PANEL
ALBUMIN: 3.5 g/dL (ref 3.5–5.2)
ALK PHOS: 72 U/L (ref 39–117)
ALT: 17 U/L (ref 0–53)
AST: 19 U/L (ref 0–37)
BUN: 13 mg/dL (ref 6–23)
CHLORIDE: 103 meq/L (ref 96–112)
CO2: 24 mEq/L (ref 19–32)
Calcium: 9.9 mg/dL (ref 8.4–10.5)
Creatinine, Ser: 0.75 mg/dL (ref 0.50–1.35)
GFR calc Af Amer: >90 mL/min (ref >90)
GFR calc non Af Amer: >90 mL/min (ref >90)
Glucose, Bld: 128 mg/dL — ABNORMAL HIGH (ref 70–99)
POTASSIUM: 3.7 meq/L (ref 3.7–5.3)
SODIUM: 142 meq/L (ref 137–147)
TOTAL PROTEIN: 6.9 g/dL (ref 6.0–8.3)
Total Bilirubin: 0.3 mg/dL (ref 0.3–1.2)

## 2013-04-10 LAB — PROTIME-INR
INR: 0.96 (ref 0.00–1.49)
Prothrombin Time: 12.6 seconds (ref 11.6–15.2)

## 2013-04-10 LAB — URINALYSIS, ROUTINE W REFLEX MICROSCOPIC
Bilirubin Urine: NEGATIVE
Glucose, UA: NEGATIVE mg/dL
Hgb urine dipstick: NEGATIVE
Ketones, ur: NEGATIVE mg/dL
LEUKOCYTES UA: NEGATIVE
NITRITE: NEGATIVE
PROTEIN: NEGATIVE mg/dL
Specific Gravity, Urine: 1.011 (ref 1.005–1.030)
Urobilinogen, UA: 0.2 mg/dL (ref 0.0–1.0)
pH: 5.5 (ref 5.0–8.0)

## 2013-04-10 LAB — PRO B NATRIURETIC PEPTIDE: Pro B Natriuretic peptide (BNP): 74.5 pg/mL (ref 0–125)

## 2013-04-10 LAB — MAGNESIUM: Magnesium: 2.1 mg/dL (ref 1.5–2.5)

## 2013-04-10 LAB — PHOSPHORUS: Phosphorus: 4 mg/dL (ref 2.3–4.6)

## 2013-04-10 LAB — LIPID PANEL
CHOLESTEROL: 235 mg/dL — AB (ref 0–200)
HDL: 37 mg/dL — ABNORMAL LOW (ref >39)
LDL Cholesterol: 142 mg/dL — ABNORMAL HIGH (ref 0–99)
Total CHOL/HDL Ratio: 6.4 RATIO
Triglycerides: 281 mg/dL — ABNORMAL HIGH (ref <150)
VLDL: 56 mg/dL — ABNORMAL HIGH (ref 0–40)

## 2013-04-10 LAB — ETHANOL: Alcohol, Ethyl (B): 169 mg/dL — ABNORMAL HIGH (ref 0–11)

## 2013-04-10 LAB — RAPID URINE DRUG SCREEN, HOSP PERFORMED
AMPHETAMINES: NOT DETECTED
BARBITURATES: NOT DETECTED
Benzodiazepines: NOT DETECTED
COCAINE: NOT DETECTED
Opiates: NOT DETECTED
Tetrahydrocannabinol: NOT DETECTED

## 2013-04-10 LAB — TSH: TSH: 1.399 u[IU]/mL (ref 0.350–4.500)

## 2013-04-10 LAB — HEMOGLOBIN A1C
Hgb A1c MFr Bld: 6 % — ABNORMAL HIGH (ref <5.7)
MEAN PLASMA GLUCOSE: 126 mg/dL — AB (ref <117)

## 2013-04-10 MED ORDER — FUROSEMIDE 10 MG/ML IJ SOLN
40.0000 mg | Freq: Once | INTRAMUSCULAR | Status: AC
  Administered 2013-04-10: 40 mg via INTRAVENOUS
  Filled 2013-04-10: qty 4

## 2013-04-10 MED ORDER — SODIUM CHLORIDE 0.9 % IJ SOLN
3.0000 mL | Freq: Two times a day (BID) | INTRAMUSCULAR | Status: DC
  Administered 2013-04-10 – 2013-04-12 (×5): 3 mL via INTRAVENOUS

## 2013-04-10 MED ORDER — IPRATROPIUM-ALBUTEROL 0.5-2.5 (3) MG/3ML IN SOLN
3.0000 mL | Freq: Three times a day (TID) | RESPIRATORY_TRACT | Status: DC
Start: 2013-04-10 — End: 2013-04-12
  Administered 2013-04-10 – 2013-04-12 (×7): 3 mL via RESPIRATORY_TRACT
  Filled 2013-04-10 (×7): qty 3

## 2013-04-10 MED ORDER — ONDANSETRON HCL 4 MG PO TABS: 4.0000 mg | ORAL_TABLET | Freq: Four times a day (QID) | ORAL | Status: DC | PRN

## 2013-04-10 MED ORDER — PANTOPRAZOLE SODIUM 40 MG PO TBEC
40.0000 mg | DELAYED_RELEASE_TABLET | Freq: Every day | ORAL | Status: DC
  Administered 2013-04-10 – 2013-04-12 (×3): 40 mg via ORAL
  Filled 2013-04-10 (×3): qty 1

## 2013-04-10 MED ORDER — ADULT MULTIVITAMIN W/MINERALS CH
1.0000 | ORAL_TABLET | Freq: Every day | ORAL | Status: DC
  Administered 2013-04-10 – 2013-04-12 (×3): 1 via ORAL
  Filled 2013-04-10 (×3): qty 1

## 2013-04-10 MED ORDER — MECLIZINE HCL 25 MG PO TABS
25.0000 mg | ORAL_TABLET | Freq: Two times a day (BID) | ORAL | Status: DC
  Administered 2013-04-10 – 2013-04-12 (×5): 25 mg via ORAL
  Filled 2013-04-10 (×6): qty 1

## 2013-04-10 MED ORDER — VITAMIN B-1 100 MG PO TABS
100.0000 mg | ORAL_TABLET | Freq: Every day | ORAL | Status: DC
  Administered 2013-04-10 – 2013-04-12 (×3): 100 mg via ORAL
  Filled 2013-04-10 (×3): qty 1

## 2013-04-10 MED ORDER — SODIUM CHLORIDE 0.9 % IV SOLN: 250.0000 mL | INTRAVENOUS | Status: DC | PRN

## 2013-04-10 MED ORDER — THIAMINE HCL 100 MG/ML IJ SOLN
100.0000 mg | Freq: Every day | INTRAMUSCULAR | Status: DC
  Filled 2013-04-10: qty 1

## 2013-04-10 MED ORDER — ATORVASTATIN CALCIUM 80 MG PO TABS
80.0000 mg | ORAL_TABLET | Freq: Every day | ORAL | Status: DC
  Administered 2013-04-10 – 2013-04-11 (×2): 80 mg via ORAL
  Filled 2013-04-10 (×3): qty 1

## 2013-04-10 MED ORDER — LIDOCAINE 5 % EX PTCH
1.0000 | MEDICATED_PATCH | CUTANEOUS | Status: DC
  Administered 2013-04-10 – 2013-04-12 (×3): 1 via TRANSDERMAL
  Filled 2013-04-10 (×4): qty 1

## 2013-04-10 MED ORDER — HEPARIN SODIUM (PORCINE) 5000 UNIT/ML IJ SOLN
5000.0000 [IU] | Freq: Three times a day (TID) | INTRAMUSCULAR | Status: DC
  Administered 2013-04-10 – 2013-04-12 (×7): 5000 [IU] via SUBCUTANEOUS
  Filled 2013-04-10 (×10): qty 1

## 2013-04-10 MED ORDER — ASPIRIN 81 MG PO CHEW
81.0000 mg | CHEWABLE_TABLET | Freq: Every day | ORAL | Status: DC
  Administered 2013-04-10 – 2013-04-12 (×3): 81 mg via ORAL
  Filled 2013-04-10 (×3): qty 1

## 2013-04-10 MED ORDER — IBUPROFEN 600 MG PO TABS
600.0000 mg | ORAL_TABLET | Freq: Four times a day (QID) | ORAL | Status: DC | PRN
  Filled 2013-04-10: qty 1

## 2013-04-10 MED ORDER — ONDANSETRON HCL 4 MG/2ML IJ SOLN: 4.0000 mg | Freq: Four times a day (QID) | INTRAMUSCULAR | Status: DC | PRN

## 2013-04-10 MED ORDER — SODIUM CHLORIDE 0.9 % IJ SOLN
3.0000 mL | Freq: Two times a day (BID) | INTRAMUSCULAR | Status: DC
Start: 2013-04-10 — End: 2013-04-12
  Administered 2013-04-10 – 2013-04-12 (×3): 3 mL via INTRAVENOUS

## 2013-04-10 MED ORDER — SODIUM CHLORIDE 0.9 % IJ SOLN: 3.0000 mL | INTRAMUSCULAR | Status: DC | PRN

## 2013-04-10 MED ORDER — IPRATROPIUM-ALBUTEROL 0.5-2.5 (3) MG/3ML IN SOLN
3.0000 mL | Freq: Four times a day (QID) | RESPIRATORY_TRACT | Status: DC
  Administered 2013-04-10: 3 mL via RESPIRATORY_TRACT
  Filled 2013-04-10: qty 3

## 2013-04-10 MED ORDER — FOLIC ACID 1 MG PO TABS
1.0000 mg | ORAL_TABLET | Freq: Every day | ORAL | Status: DC
  Administered 2013-04-10 – 2013-04-12 (×3): 1 mg via ORAL
  Filled 2013-04-10 (×3): qty 1

## 2013-04-10 NOTE — ED Provider Notes (Signed)
CSN: [id number]     Arrival date & time 04/09/13  2229 History   First MD Initiated Contact with Patient 04/09/13 2329     Chief Complaint  Patient presents with  . Dizziness  . Shortness of Breath      Patient is a 63 y.o. male presenting with dizziness and shortness of breath. The history is provided by the patient.  Dizziness Quality:  Room spinning Severity:  Moderate Onset quality:  Gradual Duration: several weeks ago. Timing:  Intermittent Progression:  Worsening Chronicity:  New Worsened by:  Nothing tried Associated symptoms: shortness of breath   Associated symptoms: no chest pain and no headaches   Shortness of Breath Severity:  Moderate Onset quality:  Gradual Timing:  Intermittent Progression:  Worsening Chronicity:  Recurrent Associated symptoms: no chest pain and no headaches   pt presents for two complaints He reports dizziness over past several weeks -  Denies focal weakness.  He reports his dizziness is so severe he will fall at times.  He uses walker at baseline to help ambulate.  No trauma reported from falls  He also reports increased SOB over past 2 weeks.  He is not sure what makes his SOB worse No active CP He reports cough without hemoptysis He also reports LE edema  He reports EMS  Was called today due to significant dizziness  Past Medical History  Diagnosis Date  . Depression   . Hypertension   . Hyperlipidemia   . Myocardial infarction 2000  . CHF (congestive heart failure)   . COPD (chronic obstructive pulmonary disease)   . Peripheral vascular disease, unspecified     08/20/10 doppler: increase in right ABI post-op. Left ABI stable. S/P bi-fem bypass surgery  . GERD (gastroesophageal reflux disease)   . Asthma   . Anxiety   . History of DVT of lower extremity   . CAD (coronary artery disease) 05/27/10    Cath: severe single vessell CAD left cx midportion obtuse marginal 2 to 3.  . Shortness of breath   . Pneumonia 04/04/2012  .  Arthritis   . OSA on CPAP   . Hyperlipemia   . COPD (chronic obstructive pulmonary disease)   . Alcohol abuse     H/O  . Tobacco abuse    Past Surgical History  Procedure Laterality Date  . Femoral bypass  08/19/10    Right Fem-Pop  . Aortogram w/ ptca  09/292003  . Eye surgery    . Back surgery    . Total knee arthroplasty    . Cholecystectomy    . Cardiac catheterization  05/27/10    severe CAD left cx  . Abdoninal ao angio & bifem angio  05/27/10    Patent graft, occluded bil stents with no retrograde flow into the hypogastric arteries. 100% occl left ant. tibial artery, 70% to 80% to 100% stenosis right superficial fem artery above adductor canal. 100 % occl right ant. tibial vessell   Family History  Problem Relation Age of Onset  . Heart attack Mother   . Cirrhosis Father   . Heart failure Mother   . Heart failure Brother   . Cancer Brother    History  Substance Use Topics  . Smoking status: Current Every Day Smoker -- 1.50 packs/day for 45 years    Types: Cigarettes  . Smokeless tobacco: Never Used     Comment: refuses information  . Alcohol Use: Yes     Comment: occ    Review of  Systems  Respiratory: Positive for shortness of breath.   Cardiovascular: Negative for chest pain.  Neurological: Positive for dizziness. Negative for headaches.  All other systems reviewed and are negative.      Allergies  Darvocet and Haldol  Home Medications   Current Outpatient Rx  Name  Route  Sig  Dispense  Refill  . amLODipine (NORVASC) 10 MG tablet   Oral   Take 10 mg by mouth daily.         [name] ibuprofen (ADVIL,MOTRIN) 200 MG tablet   Oral   Take 600 mg by mouth daily.         . Multiple Vitamin (MULTIVITAMIN WITH MINERALS) TABS tablet   Oral   Take 1 tablet by mouth daily.         [name] omeprazole (PRILOSEC) 20 MG capsule   Oral   Take 20 mg by mouth daily.           BP 123/63  Pulse 87  Temp(Src) 97.7 F (36.5 C) (Oral)  Resp 28  Ht 6' (1.829 m)   Wt 248 lb 9 oz (112.747 kg)  BMI 33.70 kg/m2  SpO2 95% Physical Exam CONSTITUTIONAL: Well developed/well nourished HEAD: Normocephalic/atraumatic EYES: EOMI/PERRL, no nystagmus,no ptosis ENMT: Mucous membranes moist NECK: supple no meningeal signs, no bruits CV: S1/S2 noted, no murmurs/rubs/gallops noted LUNGS: decreased BS noted bilaterally, but no distress noted ABDOMEN: soft, nontender, no rebound or guarding GU:no cva tenderness NEURO:Awake/alert, facies symmetric, no arm or leg drift is noted  equal hand grips bilaterally Cranial nerves 3/4/5/6/08/17/08/11/12 tested and intact No past pointing Sensation to light touch intact in all extremities EXTREMITIES: pulses normal, full ROM, LE edema noted, no signs of trauma SKIN: warm, color normal PSYCH: no abnormalities of mood noted  ED Course  Procedures   tPA in stroke considered but not given due to:  Onset over 3-4.5hours  Pt poor historian but reports dizziness for several weeks.  No focal motor deficits noted.   Also reports SOB with h/o COPD (not on home oxygen) as well as CHF (diastolic dysfunction)  5:00 AM D/w family medicine will admit.  He may have occult CVA causing his dizziness Pt currently stable  Labs Review Labs Reviewed  BASIC METABOLIC PANEL - Abnormal; Notable for the following:    Sodium 133 (*)    Glucose, Bld 120 (*)    All other components within normal limits  CBC - Abnormal; Notable for the following:    WBC 11.3 (*)    All other components within normal limits  ETHANOL - Abnormal; Notable for the following:    Alcohol, Ethyl (B) 169 (*)    All other components within normal limits  CBG MONITORING, ED - Abnormal; Notable for the following:    Glucose-Capillary 104 (*)    All other components within normal limits  URINE RAPID DRUG SCREEN (HOSP PERFORMED)  URINALYSIS, ROUTINE W REFLEX MICROSCOPIC  I-STAT TROPOININ, ED   Imaging Review Dg Chest 2 View (if Patient Has Fever And/or  Copd)  04/09/2013   CLINICAL DATA:  Dizziness and shortness of breath  EXAM: CHEST  2 VIEW  COMPARISON:  12/12/2012  FINDINGS: Chronic cardiopericardial enlargement. Pulmonary venous congestion without edema or consolidation. No effusion or pneumothorax. Surgical clips at the esophageal hiatus. Cholecystectomy changes.  IMPRESSION: Pulmonary venous congestion without edema or consolidation.   Electronically Signed   By: Jorje Guild M.D.   On: 04/09/2013 23:55   Ct Head Wo Contrast  04/10/2013  CLINICAL DATA:  Headache.  Dizziness.  Shortness breath.  EXAM: CT HEAD WITHOUT CONTRAST  TECHNIQUE: Contiguous axial images were obtained from the base of the skull through the vertex without intravenous contrast.  COMPARISON:  12/22/2012.  FINDINGS: No intracranial hemorrhage.  No CT evidence of large acute infarct. If posterior fossa infarct is of high clinical concern, MR imaging may be considered for further delineation.  No hydrocephalus.  No intracranial mass lesion noted on this unenhanced exam.  Prior left orbital surgery with mesh in place.  IMPRESSION: No intracranial hemorrhage or CT evidence of large acute infarct.  Please see above.   Electronically Signed   By: Chauncey Cruel M.D.   On: 04/10/2013 00:41     EKG Interpretation   Date/Time:  Sunday April 09 2013 23:07:36 EST Ventricular Rate:  81 PR Interval:  180 QRS Duration: 96 QT Interval:  400 QTC Calculation: 464 R Axis:   46 Text Interpretation:  Sinus rhythm RSR' in V1 or V2, probably normal  variant Artifact in lead(s) V2 V3 V5 Confirmed by Christy Gentles  MD, Monte Bronder  [phone]) on 04/09/2013 11:39:48 PM      MDM   Final diagnoses:  Dizziness  COPD (chronic obstructive pulmonary disease)  Alcohol intoxication    Nursing notes including past medical history and social history reviewed and considered in documentation Labs/vital reviewed and considered xrays reviewed and considered     Sharyon Cable, MD 04/10/13 0157

## 2013-04-10 NOTE — Care Management Note (Unsigned)
    Page 1 of 1   04/10/2013     11:58:12 AM   CARE MANAGEMENT NOTE 04/10/2013  Patient:  [name], [name]   Account Number:  [id number]  Date Initiated:  04/10/2013  Documentation initiated by:  GRAVES-BIGELOW,Zinia Innocent  Subjective/Objective Assessment:   Pt admitted for dizziness.     Action/Plan:   CM will continue to monitor for disposition needs.   Anticipated DC Date:  04/12/2013   Anticipated DC Plan:  [name]  CM consult      Choice offered to / List presented to:             Status of service:  In process, will continue to follow Medicare Important Message given?   (If response is "NO", the following Medicare IM given date fields will be blank) Date Medicare IM given:   Date Additional Medicare IM given:    Discharge Disposition:    Per UR Regulation:  Reviewed for med. necessity/level of care/duration of stay  If discussed at [place] of Stay Meetings, dates discussed:    Comments:

## 2013-04-10 NOTE — H&P (Signed)
FMTS ATTENDING ADMISSION NOTE Amylia Collazos,MD I  have seen and examined this patient, reviewed their chart. I have discussed this patient with the resident. I agree with the resident's findings, assessment and care plan.  63 Y/O M with PMX of HTN,PVD, COPD presented to the hospital with more 2 wks hx of dizziness on and off which worsened yesterday. He denies any aggravating factor or relieving factor, he denies any fall related to his dizziness, but fell in Nov 2014 due to vision issue. He described his dizziness as funny feeling of his eyes and feels light headed at that time, he denies SOB, diaphoresis,no chest pain, no tinnitus, he has hearing loss and vision problem for which he is supposed to be wearing eye glasses. He also mentioned few days ago his nursed checked his BP which was very high hence was started on Norvasc, since then his symptom worsened. He also noticed B/L ankle swelling in the last 2 days. His other complaints is his right side pain over is rib, he is unsure when and how this started. His last fall was in Nov and he does not remember falling on his side. Patient endorsed drinking alcohol on and off but never got drunk to the extent of falling.  Filed Vitals:   04/10/13 0230 04/10/13 0319 04/10/13 0504 04/10/13 0600  BP: 101/83 143/87 148/87 138/48  Pulse: 92 91 95 92  Temp:  97.5 F (36.4 C)  98.2 F (36.8 C)  TempSrc:  Oral  Oral  Resp: 22 20 20 20   Height:  6' (1.829 m)    Weight:  247 lb 2.2 oz (112.1 kg)    SpO2:  98% 98% 98%   Exam: Gen: Calm in bed,not in distress. HEENT: EOMI, PERRLA. Neuro: Awake and alert, oriented x 3, gait not assessed, CN grossly intact, strength 5/5 across all joints, reflexes intact. Chest: Very mild tenderness over his right 7th rib anterolaterally. No swelling or erythema over his side or chest. Resp: Air entry equal B/L,no wheezing. CV: S1 S2 normal,no murmur, RRR. Abd: Benign, BS+. Ext: B/L + edema up to few cm above his  ankles.  A/P: 63 Y/O M with  1. Dizziness: Currently asymptomatic.     Etiology include alcohol induced, hypotension ( medication induced),vertigo.     Check orthostatic BP.     Monitor BP.     Hg normal hence anemia unlikely.     EKG reviewed with no acute pathology. But would monitor on telemetry.     CT head negative for acute findings, stroke unlikely but if symptom persist I agree with obtaining MRI to r/o cerebella ischemia.     Fall precaution.     PT/OT recommended.  2. HTN: Currently off medication and his BP has been ok.     Hold antihypertensive for now.  3. ETOH ingestion: Alcohol level of 169, his previous admission his alcohol level has been higher.     This could be contributing to his poor health condition.      On CIWA protocol.      MVI, Thiamine, Folate recommended.      Counseling done on relationship between alcoholism and poor health condition.      He insisted he does not drink much.  4.Rib pain: Might be related to trauma from previous fall.    Pain improved on lidoderm patch.  5. Ankle swelling: Might be related to Norvasc vs CHF.    Swelling as per his hx has only been for 2  days.    Hold Norvasc which can cause pedal swelling.    Consider Lasix dose X 1.    Will discuss with Dr Lindell Noe who is taking over his care this morning.    Consider CHF assess with ECHO at some point. Can get this done as outpatient.  6.

## 2013-04-10 NOTE — ED Notes (Signed)
Weighed Pt weight is 248.9lb

## 2013-04-10 NOTE — Progress Notes (Signed)
FMTS Attending Note Patient's care discussed with resident team, I agree with Dr Marissa Calamity assessment and plan for today.  Patient with dizziness, anisocoria.  Agree with plan for MRI with contrast to evaluate for stroke, mass as cause of these. To hold his amlodipine, given question of temporal relationship between symptom onset and re-initiation of the med. Monitoring for symptoms of alcohol withdrawal.  Dalbert Mayotte, MD

## 2013-04-10 NOTE — H&P (Signed)
[facility] Admission History and Physical Service Pager: [phone]  Patient name: [name] Medical record number: [id number] Date of birth: 02/10/1950 Age: 63 y.o. Gender: male  Primary Care Provider: Elizabeth Palau, MD Consultants: None Code Status: DNR  Chief Complaint: Dizziness  Assessment and Plan: [name] is a 63 y.o. male presenting with Dizziness for 2 weeks . PMH is significant for alcoholism, PVD, COPD, CAD with remote MI, and HTN. At this point his dizziness has an unclear etiology but is at least moderately concerning for occult stroke warranting admission for workup and close observation.  Dizziness - Unclear etiology at this point, possible etiologies include a for treated hypertension, Wernicke-Korsakoff syndrome, occult stroke, BPPV - Admit to telemetry - CT head without acute abnormality identified - Partial Dix-Hallpike maneuver was positive for symptoms but not nystagmus - Will hold hypertension medicine and restart at half dose (amlodipine 10 mg down to 5 mg daily) if he improves with higher blood pressure - Trial of meclizine if he does not improve with elevated blood pressure - Consider MRI brain if symptoms don't improve - CIWA protocol and thiamine repletion- I do not feel that he has acute or acute Wernicke's encephalopathy but rather has chronic changes of Korsakoff syndrome  COPD - He is very unclear as to whether he has increased dyspnea above baseline today, his cough appears to be a baseline - Albuterol every 4 when necessary - Needs formal PFT testing  Diastolic heart failure - again, unclear on dyspnea, some evidence of fluid overload with 1+ edema BL and BL crackles at the bases, stable 2 pillow orthopnea - CXR with vascular congestion but not overt pulm edema, proBNP 74 - will hold on diuresis but will avoid IVF  Hypertension - Well controlled tonight - reports previous BP of 440H systolic and  not being dizzy, started meds and then became dizzy, BP as low as 93/65 during my interview - will hold amlodipine and re-start when necessary at 5 mg  CAD- with remote Hx of MI in the 90s - No chest pain - Risk labs : Lipid panel, A1C, TSH - Start daily ASA and consider statin pending lipid panel  Right rib/flank pain - Unclear etiology, patient describes superficial pain with point tenderness over the ribs. - Would consider CRPS at the site of an old rib fracture from alcohol-related fall as a possibility. - Trial of Lidoderm patch, will follow  PVD - s/p aorto bifemoral bypass and then BL fem-pop bypass - no acute interventions, follow up OP  Mood - suicidal comments but denies any suicidal thoughts or wanting to hurt himself - likely with underlying mood disorder, needs OP f/u  FEN/GI: Regular diet, Continued PPI Prophylaxis: Sub q heparin  Disposition: Admit to telemetry for close observation and work-up  History of Present Illness: [name] is a 63 y.o. male presenting with dizziness and trouble walking for the last 2 weeks. He describes his dizziness as a sensation of the room spinning worse with bending over or turning his head. He denies any focal weakness or changes in sensation. He states that for the last 2 years he's been drinking less but admits to drinking two 40 ounce beers today. He states that previously he drank much more on a daily basis, commenting that he drank up to 1/2 gallon a day of liquor. He states that he's had trouble walking for the last 2 years the dizziness as needed. He states that the dizziness  as a severe that he is worried he will fall and he's not able to walk for approximately 2 weeks. He describes that a few weeks ago a nurse at home found that his blood pressure was 366 systolic and he was solely due to take his blood pressure medication. He feels it more than a week later and started taking the medication which is when his symptoms  seemingly began.   He states he smokes approximately 1-1/2 packs of cigarettes daily for the last 55 years. He is a chronic cough productive of thick sputum and dyspnea which seems to be at baseline.  He states that he no longer cares that he lives but he denies suicidal thoughts or feelings.  He also complains of right-sided rib pain for at least the last 6 months. He states the pain as a 9/10 In that it hurts nearly every day without relief. There does not seem to be any aggravating or alleviating factors.   Review Of Systems: Per HPI, Otherwise 12 point review of systems was performed and was unremarkable.  Patient Active Problem List   Diagnosis Date Noted  . Dizziness 04/10/2013  . Pain in limb-Right Leg 08/10/2012  . Swelling of limb 08/10/2012  . Peripheral vascular disease, unspecified 07/27/2012  . PNA (pneumonia) 04/04/2012  . COPD (chronic obstructive pulmonary disease) 04/04/2012  . HTN (hypertension) 04/04/2012   Past Medical History: Past Medical History  Diagnosis Date  . Depression   . Hypertension   . Hyperlipidemia   . Myocardial infarction 2000  . CHF (congestive heart failure)   . COPD (chronic obstructive pulmonary disease)   . Peripheral vascular disease, unspecified     08/20/10 doppler: increase in right ABI post-op. Left ABI stable. S/P bi-fem bypass surgery  . GERD (gastroesophageal reflux disease)   . Asthma   . Anxiety   . History of DVT of lower extremity   . CAD (coronary artery disease) 05/27/10    Cath: severe single vessell CAD left cx midportion obtuse marginal 2 to 3.  . Shortness of breath   . Pneumonia 04/04/2012  . Arthritis   . OSA on CPAP   . Hyperlipemia   . COPD (chronic obstructive pulmonary disease)   . Alcohol abuse     H/O  . Tobacco abuse    Past Surgical History: Past Surgical History  Procedure Laterality Date  . Femoral bypass  08/19/10    Right Fem-Pop  . Aortogram w/ ptca  09/292003  . Eye surgery    . Back surgery     . Total knee arthroplasty    . Cholecystectomy    . Cardiac catheterization  05/27/10    severe CAD left cx  . Abdoninal ao angio & bifem angio  05/27/10    Patent graft, occluded bil stents with no retrograde flow into the hypogastric arteries. 100% occl left ant. tibial artery, 70% to 80% to 100% stenosis right superficial fem artery above adductor canal. 100 % occl right ant. tibial vessell   Social History: History  Substance Use Topics  . Smoking status: Current Every Day Smoker -- 1.50 packs/day for 45 years    Types: Cigarettes  . Smokeless tobacco: Never Used     Comment: refuses information  . Alcohol Use: Yes     Comment: occ   Additional social history: Please also refer to relevant sections of EMR.  Family History: Family History  Problem Relation Age of Onset  . Heart attack Mother   . Cirrhosis  Father   . Heart failure Mother   . Heart failure Brother   . Cancer Brother    Allergies and Medications: Allergies  Allergen Reactions  . Darvocet [Propoxyphene N-Acetaminophen] Hives  . Haldol [Haloperidol Decanoate] Hives   No current facility-administered medications on file prior to encounter.   Current Outpatient Prescriptions on File Prior to Encounter  Medication Sig Dispense Refill  . omeprazole (PRILOSEC) 20 MG capsule Take 20 mg by mouth daily.         Objective: BP 101/83  Pulse 92  Temp(Src) 97.8 F (36.6 C) (Oral)  Resp 22  Ht 6' (1.829 m)  Wt 248 lb 9 oz (112.747 kg)  BMI 33.70 kg/m2  SpO2 95% Exam:  Gen: NAD, alert, cooperative with exam HEENT: NCAT, EOMI, BL Conjunctival injection, vertigo worsened by EOMI CV: RRR, good S1/S2, no murmur Resp: Crackles at bilateral bases, good air movement, no wheezes, speaking in complete sentences, nonlabored Abd: Tight obese/distended abdomen without tenderness to palpation, point tenderness over lower right rib Ext: 1+ pitting edema bilaterally Neuro: Alert and oriented, strength 5/5 and sensation  intact in all 4 extremities,CN 2-12 intact, normal speech Psych: Denies suicidal thoughts and intent    Labs and Imaging: CBC BMET   Recent Labs Lab 04/09/13 2253  WBC 11.3*  HGB 15.3  HCT 44.7  PLT 202    Recent Labs Lab 04/09/13 2253  NA 133*  K 4.1  CL 96  CO2 22  BUN 16  CREATININE 0.76  GLUCOSE 120*  CALCIUM 9.7     EKG 04/09/2013: Normal sinus rhythm  CT head 04/10/2013 IMPRESSION:  No intracranial hemorrhage or CT evidence of large acute infarct  CXR 2 view 04/09/2013 IMPRESSION:  Pulmonary venous congestion without edema or consolidation.  Timmothy Euler, MD 04/10/2013, 3:03 AM PGY-2, [place] Intern pager: [phone], text pages welcome

## 2013-04-10 NOTE — Progress Notes (Signed)
UR Completed Ticia Virgo Graves-Bigelow, RN,BSN [phone]  

## 2013-04-10 NOTE — Progress Notes (Signed)
Family Medicine Teaching Service Daily Progress Note Intern Pager: [phone]  Patient name: [name] Medical record number: [id number] Date of birth: 10-27-1950 Age: 63 y.o. Gender: male  Primary Care Provider: Elizabeth Palau, MD Consultants: None Code Status: DNR  Pt Overview and Major Events to Date:   Assessment and Plan: [name] is a 63 y.o. male presenting with Dizziness for 2 weeks . PMH is significant for alcoholism, PVD, COPD, CAD with remote MI, and HTN. At this point his dizziness has an unclear etiology but is at least moderately concerning for occult stroke warranting admission for workup and close observation.   # Dizziness: Unclear etiology at this point, possible etiologies include a for treated hypertension, Wernicke-Korsakoff syndrome, occult stroke, BPPV. CT head without acute abnormality identified. Partial Dix-Hallpike maneuver was positive for symptoms but not nystagmus. - hold home amlodipine, if needed restart at lower dose - Trial of meclizine today - PT/OT vestibular eval - MRI brain today   # Alcohol abuse: possibly chronic changes of Korsakoff syndrome. Hyponatremia (133) on admission has resolved today. - CIWA protocol and thiamine repletion (score 0 overnight) - Consult to CSW for substance abuse  # COPD: He is very unclear as to whether he has increased dyspnea above baseline, his cough appears to be at baseline  - Albuterol q4hrs as needed  - Needs formal PFT testing   # Diastolic heart failure  - again, unclear on dyspnea, some evidence of fluid overload with 1+ edema BL and BL crackles at the bases, stable 2 pillow orthopnea  - CXR with vascular congestion but not overt pulm edema, proBNP 74  - will give 40mg  IV lasix once today  # Hypertension: reports previous BP of 093G systolic as outpatient and not being dizzy, started meds several weeks ago and then became dizzy. Overnight BP 101-123/58-83 and this morning 138/48. - will  hold amlodipine and re-start when necessary at 2.5 or 5 mg   # CAD- with remote Hx of MI in the 90s  - No chest pain  - Risk labs : Lipid panel (LDL 142), A1C (pending), TSH (pending) - Start daily ASA and consider statin (ASCVD 35yr risk >7.5%)  # Right rib/flank pain: Unclear etiology, patient describes superficial pain with point tenderness over the ribs.  - Would consider CRPS at the site of an old rib fracture from alcohol-related fall as a possibility.  - Trial of Lidoderm patch, will follow   # PVD: s/p aorto bifemoral bypass and then BL fem-pop bypass  - no acute interventions, follow up OP   # Mood: suicidal comments on admission but denies any suicidal thoughts or wanting to hurt himself  - likely with underlying mood disorder, needs OP f/u   FEN/GI: Regular diet, Continued PPI  Prophylaxis: Sub q heparin  Disposition: pending clinical improvement  Subjective:  Patient improved this morning but still complains of "room spinning" sensation. Also has some continued pain in his right side (lidoderm patch currently in place), says this pain has been present for a long time. He has noticed his legs have gotten more swollen recently, has been on lasix in the past.   Objective: Temp:  [97.5 F (36.4 C)-98.2 F (36.8 C)] 98.2 F (36.8 C) (03/02 0600) Pulse Rate:  [73-95] 92 (03/02 0600) Resp:  [19-28] 20 (03/02 0600) BP: (101-148)/(48-87) 138/48 mmHg (03/02 0600) SpO2:  [88 %-100 %] 98 % (03/02 0600) Weight:  [247 lb 2.2 oz (112.1 kg)-248 lb 9 oz (112.747 kg)] 247  lb 2.2 oz (112.1 kg) (03/02 0319) Physical Exam: General: NAD, receiving nebulizer treatment HEENT: right pupil more constricted than left, both reactive to light. EOM limited on upward gaze (previous left orbital fracture). Cardiovascular: RRR, normal heart sounds, no murmurs Respiratory: Mild scattered wheezes and bibasilar crackles, overall appears clear, no increased WOB Abdomen: soft, nontender,  nondistended Extremities: 2+ pitting edema up to mid shins bilaterally. Neuro: A+Ox4, CN grossly normal (except for upward gaze), grip strength 5/5. Romberg's initially unsteady, restarted test without problem.  Laboratory:  Recent Labs Lab 04/09/13 2253  WBC 11.3*  HGB 15.3  HCT 44.7  PLT 202    Recent Labs Lab 04/09/13 2253 04/10/13 0427  NA 133* 142  K 4.1 3.7  CL 96 103  CO2 22 24  BUN 16 13  CREATININE 0.76 0.75  CALCIUM 9.7 9.9  PROT  --  6.9  BILITOT  --  0.3  ALKPHOS  --  72  ALT  --  17  AST  --  19  GLUCOSE 120* 128*   Lipid Panel     Component Value Date/Time   CHOL 235* 04/10/2013 0427   TRIG 281* 04/10/2013 0427   HDL 37* 04/10/2013 0427   CHOLHDL 6.4 04/10/2013 0427   VLDL 56* 04/10/2013 0427   LDLCALC 142* 04/10/2013 0427   Ethanol 169 UDS negative Pro BNP 74.5 Mag 2.1 Phos 4.0  Imaging/Diagnostic Tests: Dg Chest 2 View (if Patient Has Fever And/or Copd)  04/09/2013   CLINICAL DATA:  Dizziness and shortness of breath  EXAM: CHEST  2 VIEW  COMPARISON:  12/12/2012  FINDINGS: Chronic cardiopericardial enlargement. Pulmonary venous congestion without edema or consolidation. No effusion or pneumothorax. Surgical clips at the esophageal hiatus. Cholecystectomy changes.  IMPRESSION: Pulmonary venous congestion without edema or consolidation.   Electronically Signed   By: Jorje Guild M.D.   On: 04/09/2013 23:55   Ct Head Wo Contrast  04/10/2013   CLINICAL DATA:  Headache.  Dizziness.  Shortness breath.  EXAM: CT HEAD WITHOUT CONTRAST  TECHNIQUE: Contiguous axial images were obtained from the base of the skull through the vertex without intravenous contrast.  COMPARISON:  12/22/2012.  FINDINGS: No intracranial hemorrhage.  No CT evidence of large acute infarct. If posterior fossa infarct is of high clinical concern, MR imaging may be considered for further delineation.  No hydrocephalus.  No intracranial mass lesion noted on this unenhanced exam.  Prior left orbital  surgery with mesh in place.   IMPRESSION: No intracranial hemorrhage or CT evidence of large acute infarct.  Please see above.   Electronically Signed   By: Chauncey Cruel M.D.   On: 04/10/2013 00:41    Tawanna Sat, MD 04/10/2013, 8:17 AM PGY-1, [place] Intern pager: [phone], text pages welcome

## 2013-04-11 ENCOUNTER — Inpatient Hospital Stay (HOSPITAL_COMMUNITY): Payer: Medicaid Other

## 2013-04-11 MED ORDER — SALINE SPRAY 0.65 % NA SOLN
1.0000 | NASAL | Status: DC | PRN
  Filled 2013-04-11 (×2): qty 44

## 2013-04-11 MED ORDER — AMLODIPINE BESYLATE 5 MG PO TABS
5.0000 mg | ORAL_TABLET | Freq: Every day | ORAL | Status: DC
  Administered 2013-04-11 – 2013-04-12 (×2): 5 mg via ORAL
  Filled 2013-04-11 (×2): qty 1

## 2013-04-11 MED ORDER — GADOBENATE DIMEGLUMINE 529 MG/ML IV SOLN
20.0000 mL | Freq: Once | INTRAVENOUS | Status: AC
  Administered 2013-04-11: 20 mL via INTRAVENOUS

## 2013-04-11 MED ORDER — CYCLOBENZAPRINE HCL 5 MG PO TABS
5.0000 mg | ORAL_TABLET | Freq: Three times a day (TID) | ORAL | Status: DC | PRN
  Administered 2013-04-11 – 2013-04-12 (×2): 5 mg via ORAL
  Filled 2013-04-11 (×3): qty 1

## 2013-04-11 NOTE — Evaluation (Signed)
Occupational Therapy Evaluation Patient Details Name: [name] MRN: [id number] DOB: 10-24-1950 Today's Date: 04/11/2013 Time: 0998-3382 OT Time Calculation (min): 27 min  OT Assessment / Plan / Recommendation History of present illness 63 y.o. male admitted to [facility] on 04/09/13 with Dizziness for 2 weeks . PMH is significant for alcoholism, PVD, COPD, CAD with remote MI, and HTN. At this point his dizziness has an unclear etiology but is at least moderately concerning for occult stroke warranting admission for workup and close observation.  CT scan negative for acute infarct.  Also of significance, pt had L eye surgery in Dec of '14.  He has been wearing a left eye patch (that he bought on his own without suggestion by his surgeon) due to double vision.     Clinical Impression   Recommend follow up with eye doctor of patients choice for Humphrey 120 visual assessment due to reports of inability to see objects in left visual fields superior / inferior. Pt reports surgery to left eye and now reports diplopia. OT to defer all further vestibular work up to PT.  Changes noted in location of dizziness and "pressure" in skull    OT Assessment  Patient does not need any further OT services    Follow Up Recommendations  Other (comment) (VISUAL exam- diplopia noted )    Barriers to Discharge      Equipment Recommendations  None recommended by OT    Recommendations for Other Services Other (comment) (Eye doctor of pts choice for Humphrey 120 assessment)  Frequency       Precautions / Restrictions Precautions Precautions: Fall Precaution Comments: h/o falls and staggering during dizzy episodes.  Required Braces or Orthoses: Other Brace/Splint Other Brace/Splint: pt reports he wears left eye patch at home.  Does not have it with him.    Pertinent Vitals/Pain Pain in rt abdomen SEE PT EVAL FOR orthostatic bp obtain during session Pt noted to have changes Pt reports inconsistent pain in his  head- initially at temples, then with movement very lateral aspect over ears. THen with mobility reports pain at central top of head.     ADL  Lower Body Dressing: Modified independent Where Assessed - Lower Body Dressing: Unsupported sit to stand Transfers/Ambulation Related to ADLs: Pt ambulates with decr gait velocity. See PT assessment ADL Comments: Pt able to complete bed mobilty but demonstrates dyspnea  with Rt side pain. Pt with poor tolerance of flat supine positioning. Pt noted to have changes in BP see vitals. PT at baseline has aide to assist with adls. Pt with visual deficits that incr risk for falls. Pt reports a falls prior to this admission.     OT Diagnosis:    OT Problem List:   OT Treatment Interventions:     OT Goals(Current goals can be found in the care plan section) Acute Rehab OT Goals Patient Stated Goal: to decrease dizziness, have his left eye vision get better.   Visit Information  Last OT Received On: 04/11/13 Assistance Needed: +1 Reason for Co-Treatment: Complexity of the patient's impairments (multi-system involvement);For patient/therapist safety;Other (comment) (two person assist for vestibular testing. ) PT goals addressed during session: Mobility/safety with mobility;Balance;Other (comment) (vertigo) History of Present Illness: 63 y.o. male admitted to [facility] on 04/09/13 with Dizziness for 2 weeks . PMH is significant for alcoholism, PVD, COPD, CAD with remote MI, and HTN. At this point his dizziness has an unclear etiology but is at least moderately concerning for occult stroke warranting admission for  workup and close observation.  CT scan negative for acute infarct.  Also of significance, pt had L eye surgery in Dec of '14.  He has been wearing a left eye patch (that he bought on his own without suggestion by his surgeon) due to double vision.         Prior [place] expects to be discharged to:: Private  residence Living Arrangements: Alone Available Help at Discharge: Personal care attendant;Other (Comment) Type of Home: Apartment Home Access: Elevator;Other (comment) Home Layout: One level Home Equipment: Cane - single point Prior Function Level of Independence: Needs assistance Gait / Transfers Assistance Needed: uses cane both inside and outside PRN.   ADL's / Homemaking Assistance Needed: aide helps with household chores, cooking, getting his food.    Communication / Swallowing Assistance Needed: HOH Comments: Per pt if the elevator is broken he just doesn't go out.   Communication Communication: HOH Dominant Hand: Right         Vision/Perception Vision - History Baseline Vision: Wears glasses only for reading Visual History: Corrective eye surgery Patient Visual Report: Blurring of vision;Diplopia Vision - Assessment Eye Alignment: Impaired (comment) Vision Assessment: Vision tested Ocular Range of Motion: Restricted on the left;Impaired-to be further tested in functional context Convergence: Impaired (comment) Diplopia Assessment: Present in near gaze;Only with left gaze Additional Comments: pt closing left eye. Pt tilting head toward the right. pt reports he bought a patch and wears it at home. Pt reports objects disappearing in left superior / inferior visual fields. Pt reports diplopia just slight left of midline. Pt reports s/p fall requiring eye surgery with pending further follow up. Pt demonstrates diplopia and visual field changes. Recommend follow up for for " Humphrey 120". Pt could benefit from use of patch to decr diplopia and decr fall risk.   Perception Perception: Within Functional Limits   Cognition  Cognition Arousal/Alertness: Awake/alert Behavior During Therapy: WFL for tasks assessed/performed Overall Cognitive Status: Within Functional Limits for tasks assessed    Extremity/Trunk Assessment Upper Extremity Assessment Upper Extremity Assessment:  Defer to OT evaluation Lower Extremity Assessment Lower Extremity Assessment: Overall WFL for tasks assessed (poor LE endurance due to claudication) Cervical / Trunk Assessment Cervical / Trunk Assessment: Normal;Other exceptions Cervical / Trunk Exceptions: pt reports h/o low back issues.  No surgical scars.  Flat back posture.      Mobility Bed Mobility Overal bed mobility: Modified Independent General bed mobility comments: uses arms to assist with mobility.   Transfers Overall transfer level: Needs assistance Equipment used: Straight cane Transfers: Sit to/from Stand Sit to Stand: Supervision General transfer comment: supervision for safety due to reports of dizziness     Exercise     Balance Balance Overall balance assessment: Needs assistance Sitting-balance support: Bilateral upper extremity supported;Feet supported;Single extremity supported;No upper extremity supported Sitting balance-Leahy Scale: Normal Sitting balance - Comments: No difficulty donning socks and pants EOB.   Standing balance support: Single extremity supported Standing balance-Leahy Scale: Good General Comments General comments (skin integrity, edema, etc.): Vestibular assessment completed.  Marye Round (-) for nystagums, positive for pressure and pt doesn't like being in this position because he doesn't feel like he can breathe.  Occulomotor testing revealed good tracking, but chnges in vision on left side (pt reports blurriness peripherally, double vision centrally).  VOR testing with good ability to maintain focus on the target, but was symptom provoking.  Eye alignment seems normal.  Pt is able to converge.  Deficits are most likely related to his left eye issues and recent surgery here.  We spoke breifly about using the patch if needed for balance during gait to decrease his double vision symptoms and bad input from his left eye, but if he is sitting down to try to alternate the patch to his right eye, so  his left eye can get stronger.  Never wear the patch on the right eye during gait.    End of Session OT - End of Session Activity Tolerance: Patient tolerated treatment well Patient left: Other (comment) (PT becca) Nurse Communication: Mobility status;Precautions  GO     Peri Maris 04/11/2013, 1:57 PM Pager: [phone]

## 2013-04-11 NOTE — Evaluation (Addendum)
Physical Therapy Evaluation Patient Details Name: [name] MRN: [id number] DOB: 13-Jan-1951 Today's Date: 04/11/2013 Time: 3086-5784 PT Time Calculation (min): 36 min  PT Assessment / Plan / Recommendation History of Present Illness  63 y.o. male admitted to [facility] on 04/09/13 with Dizziness for 2 weeks . PMH is significant for alcoholism, PVD, COPD, CAD with remote MI, and HTN. At this point his dizziness has an unclear etiology but is at least moderately concerning for occult stroke warranting admission for workup and close observation.  CT scan negative for acute infarct.  Also of significance, pt had L eye surgery in Dec of '14.  He has been wearing a left eye patch (that he bought on his own without suggestion by his surgeon) due to double vision.    Clinical Impression  PT Vestibular assessment complete.  Marye Round (-) for nystagums, positive for pressure and pt doesn't like being in this position because he doesn't feel like he can breathe.  Occulomotor testing revealed good tracking, but chnges in vision on left side (pt reports blurriness peripherally, double vision centrally).  VOR testing with good ability to maintain focus on the target, but was symptom provoking.  Eye alignment seems normal.  Pt is able to converge.  Deficits are most likely related to his left eye issues and recent surgery here.  We spoke breifly about using the patch if needed for balance during gait to decrease his double vision symptoms and bad input from his left eye, but if he is sitting down to try to alternate the patch to his right eye, so his left eye can get stronger.  Never wear the patch on the right eye during gait.  Pt was mildly hypotensive with change in positions (from sitting to standing) and mildly symptomatic.  See vitals flow sheet.  PT to follow acutely for deficits listed below.       PT Assessment  Patient needs continued PT services    Follow Up Recommendations  No PT follow up;Supervision  - Intermittent    Does the patient have the potential to tolerate intense rehabilitation     NA  Barriers to Discharge   None      Equipment Recommendations  None recommended by PT    Recommendations for Other Services   None (See OT assessment- likely needs some vision f/u)  Frequency Min 3X/week    Precautions / Restrictions Precautions Precautions: Fall Precaution Comments: h/o falls and staggering during dizzy episodes.  Required Braces or Orthoses: Other Brace/Splint Other Brace/Splint: pt reports he wears left eye patch at home.  Does not have it with him.  Restrictions Weight Bearing Restrictions: No   Pertinent Vitals/Pain  04/11/13 0940  Vital Signs  Pulse Rate 90  BP 113/68 mmHg  BP Location Right arm  BP Method Automatic  Patient Position, if appropriate Lying  Oxygen Therapy  SpO2 96 %  O2 Device None (Room air)    04/11/13 0941  Vital Signs  BP ! 151/97 mmHg  BP Location Right arm  BP Method Automatic  Patient Position, if appropriate Sitting    04/11/13 0944  Vital Signs  BP 137/82 mmHg  BP Location Right arm  BP Method Automatic  Patient Position, if appropriate Standing    04/11/13 0955  Vital Signs  Pulse Rate ! 105  Oxygen Therapy  SpO2 97 % (DOE 2/4)  O2 Device None (Room air)    04/11/13 1002  Vital Signs  Pulse Rate 96  BP !  141/86 mmHg (after walking with PT/OT)  BP Location Right arm  BP Method Automatic  Patient Position, if appropriate Standing  Oxygen Therapy  SpO2 98 %  O2 Device None (Room air)        Mobility  Bed Mobility Overal bed mobility: Modified Independent General bed mobility comments: uses arms to assist with mobility.   Transfers Overall transfer level: Needs assistance Equipment used: Straight cane Transfers: Sit to/from Stand Sit to Stand: Supervision General transfer comment: supervision for safety due to reports of dizziness Ambulation/Gait Ambulation/Gait assistance: Supervision Ambulation  Distance (Feet): 150 Feet Assistive device: Straight cane Gait Pattern/deviations: Step-through pattern;Wide base of support Gait velocity: decreased Gait velocity interpretation: Below normal speed for age/gender General Gait Details: Pt with slow, cautious gait pattern.  Wide BOS, multiple, slow steps when turning around (180 degrees).  Wide base may be due to body habitus vs for balance. Pt reported DOE during gait, but HR and O2 sats stable on RA.  DOE 2/4.  Of note, pt has limited walking tolerance at baseline (as far as distance) due to intermittent claudication symptoms in bil legs.  He has to sit and rest before going on.  He has already had right fem pop bypass grafting.          PT Diagnosis: Difficulty walking;Abnormality of gait;Generalized weakness;Acute pain  PT Problem List: Decreased activity tolerance;Decreased balance;Decreased mobility;Decreased knowledge of use of DME;Obesity;Pain PT Treatment Interventions: DME instruction;Gait training;Stair training;Functional mobility training;Therapeutic activities;Therapeutic exercise;Balance training;Neuromuscular re-education;Patient/family education     PT Goals(Current goals can be found in the care plan section) Acute Rehab PT Goals Patient Stated Goal: to decrease dizziness, have his left eye vision get better.  PT Goal Formulation: With patient Time For Goal Achievement: 04/25/13 Potential to Achieve Goals: Good  Visit Information  Last PT Received On: 04/11/13 Assistance Needed: +1 PT/OT/SLP Co-Evaluation/Treatment: Yes Reason for Co-Treatment: Complexity of the patient's impairments (multi-system involvement);For patient/therapist safety;Other (comment) (two person assist for vestibular testing. ) PT goals addressed during session: Mobility/safety with mobility;Balance;Other (comment) (vertigo) History of Present Illness: 63 y.o. male admitted to [facility] on 04/09/13 with Dizziness for 2 weeks . PMH is significant for  alcoholism, PVD, COPD, CAD with remote MI, and HTN. At this point his dizziness has an unclear etiology but is at least moderately concerning for occult stroke warranting admission for workup and close observation.  CT scan negative for acute infarct.  Also of significance, pt had L eye surgery in Dec of '14.  He has been wearing a left eye patch (that he bought on his own without suggestion by his surgeon) due to double vision.         Prior [place] expects to be discharged to:: Private residence Living Arrangements: Alone Available Help at Discharge: Personal care attendant;Other (Comment) (aid 7 days per week m-fri 3.5 hours, sat-sun 2 hours) Type of Home: Apartment Home Access: Elevator;Other (comment) (elevator if it works, otherwise he lives on the second floor) Home Layout: One level [place]: [place] - single point Prior Function Level of Independence: Needs assistance Gait / Transfers Assistance Needed: uses cane both inside and outside PRN.   ADL's / Homemaking Assistance Needed: aide helps with household chores, cooking, getting his food.    Communication / Swallowing Assistance Needed: HOH Comments: Per pt if the elevator is broken he just doesn't go out.   Communication Communication: HOH Dominant Hand: Right    Cognition  Cognition Arousal/Alertness: Awake/alert Behavior During Therapy:  WFL for tasks assessed/performed Overall Cognitive Status: Within Functional Limits for tasks assessed (not specifically tested)    Extremity/Trunk Assessment Upper Extremity Assessment Upper Extremity Assessment: Defer to OT evaluation Lower Extremity Assessment Lower Extremity Assessment: Overall WFL for tasks assessed (poor LE endurance due to claudication) Cervical / Trunk Assessment Cervical / Trunk Assessment: Normal;Other exceptions Cervical / Trunk Exceptions: pt reports h/o low back issues.  No surgical scars.  Flat back posture.    Balance  Balance Overall balance assessment: Needs assistance Sitting-balance support: Bilateral upper extremity supported;Feet supported;Single extremity supported;No upper extremity supported Sitting balance-Leahy Scale: Normal Sitting balance - Comments: No difficulty donning socks and pants EOB.   Standing balance support: Single extremity supported Standing balance-Leahy Scale: Good General Comments General comments (skin integrity, edema, etc.): Vestibular assessment completed.  Marye Round (-) for nystagums, positive for pressure and pt doesn't like being in this position because he doesn't feel like he can breathe.  Occulomotor testing revealed good tracking, but chnges in vision on left side (pt reports blurriness peripherally, double vision centrally).  VOR testing with good ability to maintain focus on the target, but was symptom provoking.  Eye alignment seems normal.  Pt is able to converge.  Deficits are most likely related to his left eye issues and recent surgery here.  We spoke breifly about using the patch if needed for balance during gait to decrease his double vision symptoms and bad input from his left eye, but if he is sitting down to try to alternate the patch to his right eye, so his left eye can get stronger.  Never wear the patch on the right eye during gait.   End of Session PT - End of Session Activity Tolerance: Patient limited by fatigue Patient left: in chair;with call bell/phone within reach Nurse Communication: Mobility status    Wells Guiles B. [place], [place], DPT [phone]   04/11/2013, 10:43 AM

## 2013-04-11 NOTE — Progress Notes (Signed)
Family Medicine Teaching Service Daily Progress Note Intern Pager: [phone]  Patient name: [name] Medical record number: [id number] Date of birth: 1950-10-30 Age: 63 y.o. Gender: male  Primary Care Provider: Elizabeth Palau, MD Consultants: None Code Status: DNR  Pt Overview and Major Events to Date:   Assessment and Plan: [name] is a 63 y.o. male presenting with Dizziness for 2 weeks . PMH is significant for alcoholism, PVD, COPD, CAD with remote MI, and HTN. At this point his dizziness has an unclear etiology but is at least moderately concerning for occult stroke warranting admission for workup and close observation.   # Dizziness: Unclear etiology at this point, possible etiologies include a for treated hypertension, Wernicke-Korsakoff syndrome, occult stroke, BPPV. CT head without acute abnormality identified. Partial Dix-Hallpike maneuver was positive for symptoms but not nystagmus. - hold home amlodipine, if needed restart at lower dose - Meclizine BID - PT/OT vestibular eval - does not appear to be BPPV - MRI brain today   # Alcohol abuse: possibly chronic changes of Korsakoff syndrome. Hyponatremia (133) on admission has resolved today. - CIWA protocol and thiamine repletion (score 0 overnight) - Consult to CSW for substance abuse  # COPD: He is very unclear as to whether he has increased dyspnea above baseline, his cough appears to be at baseline  - Albuterol q4hrs as needed  - Needs formal PFT testing   # Diastolic heart failure  - again, unclear on dyspnea, some evidence of fluid overload with 1+ edema BL and BL crackles at the bases, stable 2 pillow orthopnea  - CXR with vascular congestion but not overt pulm edema, proBNP 74  - s/p lasix 40mg  IV 3/2 (3L output)  # Hypertension: reports previous BP of 732K systolic as outpatient and not being dizzy, started meds several weeks ago and then became dizzy. Overnight BP 101-123/58-83 and this  morning 138/48. - will hold amlodipine and re-start when necessary at 2.5 or 5 mg   # CAD- with remote Hx of MI in the 90s  - No chest pain  - Risk labs : Lipid panel (LDL 142), A1C (pending), TSH (pending) - Start daily ASA and consider statin (ASCVD 22yr risk >7.5%)  # Right rib/flank pain: Unclear etiology, patient describes superficial pain with point tenderness over the ribs.  - Would consider CRPS at the site of an old rib fracture from alcohol-related fall as a possibility.  - Trial of Lidoderm patch, will follow  - worsened pain, will try flexeril  - plain films right ribs today  # PVD: s/p aorto bifemoral bypass and then BL fem-pop bypass  - no acute interventions, follow up OP   # Mood: suicidal comments on admission but denies any suicidal thoughts or wanting to hurt himself  - likely with underlying mood disorder, needs OP f/u   FEN/GI: Regular diet, Continued PPI  Prophylaxis: Sub q heparin  Disposition: pending clinical improvement  Subjective:  Patient complains primarily of right rib pain that has worsened (chronic, but "never this bad"). Still has some dizziness throughout the day yesterday, 3-4 episodes that he primarily notices with sitting up. No CP, SOB, tremors, diaphoresis.  Objective: Temp:  [98.1 F (36.7 C)-98.8 F (37.1 C)] 98.5 F (36.9 C) (03/03 0442) Pulse Rate:  [86-99] 86 (03/03 0442) Resp:  [16-18] 16 (03/03 0442) BP: (119-157)/(52-84) 157/84 mmHg (03/03 0442) SpO2:  [92 %-100 %] 95 % (03/03 0442) Physical Exam: General: NAD, receiving nebulizer treatment HEENT: right pupil more constricted  than left, both reactive to light. EOM limited on upward gaze (previous left orbital fracture). Cardiovascular: RRR, normal heart sounds, no murmurs.  Chest wall: tenderness to lateral 12th rib on right side, no skin lesions or erythema of the area. Lidoderm patch placed below where he is complaining of pain. Respiratory: Mild scattered wheezes and  bibasilar crackles, overall appears clear, no increased WOB Abdomen: soft, nontender, nondistended Extremities: 2+ pitting edema up to mid shins bilaterally. Neuro: A+Ox4, CN grossly normal (except for upward gaze), grip strength 5/5. No nystagmus noted on sitting up.  Laboratory:  Recent Labs Lab 04/09/13 2253  WBC 11.3*  HGB 15.3  HCT 44.7  PLT 202    Recent Labs Lab 04/09/13 2253 04/10/13 0427  NA 133* 142  K 4.1 3.7  CL 96 103  CO2 22 24  BUN 16 13  CREATININE 0.76 0.75  CALCIUM 9.7 9.9  PROT  --  6.9  BILITOT  --  0.3  ALKPHOS  --  72  ALT  --  17  AST  --  19  GLUCOSE 120* 128*   Lipid Panel     Component Value Date/Time   CHOL 235* 04/10/2013 0427   TRIG 281* 04/10/2013 0427   HDL 37* 04/10/2013 0427   CHOLHDL 6.4 04/10/2013 0427   VLDL 56* 04/10/2013 0427   LDLCALC 142* 04/10/2013 0427   Ethanol 169 UDS negative Pro BNP 74.5 Mag 2.1 Phos 4.0  Imaging/Diagnostic Tests: Dg Chest 2 View (if Patient Has Fever And/or Copd)  04/09/2013   CLINICAL DATA:  Dizziness and shortness of breath  EXAM: CHEST  2 VIEW  COMPARISON:  12/12/2012  FINDINGS: Chronic cardiopericardial enlargement. Pulmonary venous congestion without edema or consolidation. No effusion or pneumothorax. Surgical clips at the esophageal hiatus. Cholecystectomy changes.  IMPRESSION: Pulmonary venous congestion without edema or consolidation.   Electronically Signed   By: Jorje Guild M.D.   On: 04/09/2013 23:55   Ct Head Wo Contrast  04/10/2013   CLINICAL DATA:  Headache.  Dizziness.  Shortness breath.  EXAM: CT HEAD WITHOUT CONTRAST  TECHNIQUE: Contiguous axial images were obtained from the base of the skull through the vertex without intravenous contrast.  COMPARISON:  12/22/2012.  FINDINGS: No intracranial hemorrhage.  No CT evidence of large acute infarct. If posterior fossa infarct is of high clinical concern, MR imaging may be considered for further delineation.  No hydrocephalus.  No intracranial  mass lesion noted on this unenhanced exam.  Prior left orbital surgery with mesh in place.   IMPRESSION: No intracranial hemorrhage or CT evidence of large acute infarct.  Please see above.   Electronically Signed   By: Chauncey Cruel M.D.   On: 04/10/2013 00:41    Tawanna Sat, MD 04/11/2013, 7:32 AM PGY-1, [place] Intern pager: [phone], text pages welcome

## 2013-04-11 NOTE — Progress Notes (Signed)
FMTS Attending Note Patient seen and examined by me, discussed with resident team and I agree with Dr Marissa Calamity assessment and plan.  Patient reports somewhat less dizziness today; still some when going from laying to sitting, sitting to standing.   We are awaiting the results of MRI brain.  Patient denies diplopia to me this morning.  A/P: Dizziness with unclear etiology.  Awaiting MRI brain.  Appreciate OT input.  Suspect peripheral source.  Disposition largely determined by PT/OT recommendations.  Patient's BP has been creeping up; we are holding his antihypertensive medications.  Plan to reinstate these. Question whether some effect of alcohol withdrawal on blood pressure. On CIWA protocol. Dalbert Mayotte, MD

## 2013-04-12 DIAGNOSIS — J449 Chronic obstructive pulmonary disease, unspecified: Secondary | ICD-10-CM

## 2013-04-12 MED ORDER — ATORVASTATIN CALCIUM 80 MG PO TABS: 80.0000 mg | ORAL_TABLET | Freq: Every day | ORAL | Status: DC

## 2013-04-12 MED ORDER — MECLIZINE HCL 25 MG PO TABS: 25.0000 mg | ORAL_TABLET | Freq: Three times a day (TID) | ORAL | Status: DC | PRN

## 2013-04-12 MED ORDER — AMLODIPINE BESYLATE 5 MG PO TABS: 5.0000 mg | ORAL_TABLET | Freq: Every day | ORAL | Status: DC

## 2013-04-12 MED ORDER — THIAMINE HCL 100 MG PO TABS: 100.0000 mg | ORAL_TABLET | Freq: Every day | ORAL | Status: DC

## 2013-04-12 MED ORDER — ASPIRIN 81 MG PO CHEW: 81.0000 mg | CHEWABLE_TABLET | Freq: Every day | ORAL | Status: DC

## 2013-04-12 MED ORDER — FOLIC ACID 1 MG PO TABS: 1.0000 mg | ORAL_TABLET | Freq: Every day | ORAL | Status: DC

## 2013-04-12 NOTE — Discharge Summary (Signed)
[facility] Discharge Summary  Patient name: [name] Medical record number: [id number] Date of birth: 09/16/1950 Age: 63 y.o. Gender: male Date of Admission: 04/09/2013  Date of Discharge: 04/12/2013 Admitting Physician: Andrena Mews, MD  Primary Care Provider: Elizabeth Palau, MD Consultants: none  Indication for Hospitalization: Dizziness, unsteady gait  Discharge Diagnoses/Problem List:  Suspected chronic Wernicke-Korsakoff syndrome Alcohol abuse COPD CAD with prior hx of MI Diastolic CHF PVD Hypertension  Disposition: home  Discharge Condition: stable, improved  Brief Hospital Course:  [name] is a 63 y.o. male that was admitted for dizziness and unsteady gait for 2 weeks. PMH significant for alcohol abuse, COPD, CAD, PVD, HTN. Initial workup included hyponatremia (133), mild leukocytosis (11.3), AST 19 / ALT 17, ethanol level 169 mg/dL, Pro BNP 74.5, normal EKG, normal CXR, normal head CT; Dix-Hallpike was positive for symptoms but negative for nystagmus. He was monitored with improvement in symptoms, though still persistent while off antihypertensive medications that he had recently restarted with onset of symptoms. Vestibular evaluation was negative for BPPV. He additionally had an MRI of the brain that was negative for mass lesion or stroke, showed chronic atrophy changes. He was able to ambulate well and tolerated restarting amlodipine at half his home dose; stable for discharge.  # Dizziness, Suspected chronic Wernicke-Korsakoff syndrome 2/2 alcohol abuse: onset of dizziness symptoms coincided with restarting amlodipine 10mg , and on presentation had low BP; amlodipine held until day before discharge. Had evidence of chronic changes from alcohol abuse including ataxia, opthalmoplegia (though exam difficult as he had recent left facial fractures from fall). Scheduled meclizine BID improved symptoms somewhat. MRI brain negative  for acute pathology, showed chronic diffuse atrophy. Vestibular evaluation did not show dizziness to be BPPV, and amlodipine was restarted at lower dose (5mg ) with no further evidence of worsening symptoms.  # Alcohol abuse: Given IV thiamine and folic acid supplementation. CIWA scores during hospital course were 0, did not display evidence of alcohol withdrawal. Discussed with the patient his symptoms were likely from chronic alcohol abuse, however he was in denial and did not demonstrate any willingness to decrease alcohol use on discharge.  # COPD: never formally diagnosed, however was using albuterol as needed at home. Continued albuterol as needed, had no difficulty with breathing during hospital course.  # Right flank/rib pain, suspected musculoskeletal: longstanding problem, patient is unaware of any falls that may have occurred. Suspected musculoskeletal by exam. Lidoderm patch and flexeril were ineffective. Plain films of the right ribs were negative for any pathology.   # Hypertension: initial presentation with BP to low of 93/65, held home amlodipine on admission. Systolic BP increased to 956-213Y, amlodipine was restarted on 3/3 at half the home dose (5mg ) with no worsening of symptoms and slightly improved BP.  # Diastolic CHF: had some initial evidence of fluid overload including bilateral edema and crackles on lung exam, CXR with mild vascular congestion. Received IV lasix 40mg  x 1 with 3L output on 3/2. Repeat echo was not done.  Issues for Follow Up:  1. Alcohol abuse: will need continued education and evaluation for willingness to quit. Discharged with thiamine and folic acid supplements 2. COPD: has never had formal PFT testing; if worsens should start on controller medication. 3. Right lateral flank pain: unclear etiology, ruled out fracture and exam was reassuring; symptomatic treatment with flexeril and lidoderm patch were ineffective. Unrelated to food, in future workup if still  has persistent pain may consider abdominal ultrasound to  rule out gallbladder pathology. 4. Hypertension: restarted amlodipine at half home dose, this will likely not control his blood pressure and may require additional agents. Initially suspected this to be the cause of his dizziness, but with continued symptoms while it was held and no worsening after restarting believe this to be less likely.  Significant Procedures: none  Significant Labs and Imaging:   Recent Labs Lab 04/09/13 2253  WBC 11.3*  HGB 15.3  HCT 44.7  PLT 202    Recent Labs Lab 04/09/13 2253 04/10/13 0427  NA 133* 142  K 4.1 3.7  CL 96 103  CO2 22 24  GLUCOSE 120* 128*  BUN 16 13  CREATININE 0.76 0.75  CALCIUM 9.7 9.9  MG  --  2.1  PHOS  --  4.0  ALKPHOS  --  72  AST  --  19  ALT  --  17  ALBUMIN  --  3.5    Dg Chest 2 View (if Patient Has Fever And/or Copd)  04/09/2013   CLINICAL DATA:  Dizziness and shortness of breath  EXAM: CHEST  2 VIEW  COMPARISON:  12/12/2012  FINDINGS: Chronic cardiopericardial enlargement. Pulmonary venous congestion without edema or consolidation. No effusion or pneumothorax. Surgical clips at the esophageal hiatus. Cholecystectomy changes.   IMPRESSION: Pulmonary venous congestion without edema or consolidation.   Electronically Signed   By: Jorje Guild M.D.   On: 04/09/2013 23:55   Dg Ribs Unilateral Right  04/11/2013   CLINICAL DATA:  R rib pain  EXAM: RIGHT RIBS - 2 VIEW  COMPARISON:  DG CHEST 2 VIEW dated 04/09/2013  FINDINGS: No fracture or other bone lesions are seen involving the ribs.   IMPRESSION: Negative.   Electronically Signed   By: Margaree Mackintosh M.D.   On: 04/11/2013 13:14   Ct Head Wo Contrast  04/10/2013   CLINICAL DATA:  Headache.  Dizziness.  Shortness breath.  EXAM: CT HEAD WITHOUT CONTRAST  TECHNIQUE: Contiguous axial images were obtained from the base of the skull through the vertex without intravenous contrast.  COMPARISON:  12/22/2012.  FINDINGS: No  intracranial hemorrhage.  No CT evidence of large acute infarct. If posterior fossa infarct is of high clinical concern, MR imaging may be considered for further delineation.  No hydrocephalus.  No intracranial mass lesion noted on this unenhanced exam.  Prior left orbital surgery with mesh in place.   IMPRESSION: No intracranial hemorrhage or CT evidence of large acute infarct.  Please see above.   Electronically Signed   By: Chauncey Cruel M.D.   On: 04/10/2013 00:41   [name] VQ Contrast  04/11/2013   CLINICAL DATA:  Dizziness. Possible stroke. History of alcoholism. History of hypertension.  EXAM: MRI HEAD WITHOUT AND WITH CONTRAST  TECHNIQUE: Multiplanar, multiecho pulse sequences of the brain and surrounding structures were obtained without and with intravenous contrast.  CONTRAST:  20mL MULTIHANCE GADOBENATE DIMEGLUMINE 529 MG/ML IV SOLN  COMPARISON:  CT HEAD W/O CM dated 04/10/2013; CT MAXILLOFACIAL W/O CM dated 12/12/2012; CT HEAD W/O CM dated 12/12/2012  FINDINGS: The patient was unable to remain motionless for the exam. Small or subtle lesions could be overlooked.  Patient's left orbital floor fracture was repaired with mesh which was compatible with MRI. No adverse features were encountered during scanning.  No evidence for acute infarction, hemorrhage, mass lesion, hydrocephalus, or extra-axial fluid. Premature for age cerebral and cerebellar atrophy. Mild to moderate subcortical and periventricular T2 and FLAIR hyperintensities, likely chronic microvascular  ischemic change. Flow voids are maintained in the carotid, basilar, and vertebral arteries. There is a chronic hemorrhage right inferior temporal lobe which could be posttraumatic or reflect hypertensive cerebral vascular disease. Another area of susceptibility artifact representing calcification or ossification is seen in the left frontal convexity extra-axial compartment. Partial empty sella.  Post infusion, no abnormal enhancement of the brain or  meninges. Mild chronic sinus disease. Posttraumatic changes left maxillary sinus related to left orbital blowout fracture. Possible left enophthalmos. No mastoid fluid.   IMPRESSION: Chronic changes as described.  No acute intracranial findings.   Electronically Signed   By: Rolla Flatten M.D.   On: 04/11/2013 19:33    Results/Tests Pending at Time of Discharge: none  Discharge Medications:    Medication List    STOP taking these medications       ibuprofen 200 MG tablet  Commonly known as:  ADVIL,MOTRIN      TAKE these medications       amLODipine 5 MG tablet  Commonly known as:  NORVASC  Take 1 tablet (5 mg total) by mouth daily.     aspirin 81 MG chewable tablet  Chew 1 tablet (81 mg total) by mouth daily.     atorvastatin 80 MG tablet  Commonly known as:  LIPITOR  Take 1 tablet (80 mg total) by mouth daily at 6 PM.     folic acid 1 MG tablet  Commonly known as:  FOLVITE  Take 1 tablet (1 mg total) by mouth daily.     meclizine 25 MG tablet  Commonly known as:  ANTIVERT  Take 1 tablet (25 mg total) by mouth 3 (three) times daily as needed for dizziness.     multivitamin with minerals Tabs tablet  Take 1 tablet by mouth daily.     omeprazole 20 MG capsule  Commonly known as:  PRILOSEC  Take 20 mg by mouth daily.     thiamine 100 MG tablet  Take 1 tablet (100 mg total) by mouth daily.        Discharge Instructions: Please refer to Patient Instructions section of EMR for full details.  Patient was counseled important signs and symptoms that should prompt return to medical care, changes in medications, dietary instructions, activity restrictions, and follow up appointments.   Follow-Up Appointments: Follow-up Information   Follow up with Elizabeth Palau, MD In 1 week. (For hospital follow up)    Specialty:  Family Medicine   Contact information:   [place] [address] [phone]       Tawanna Sat, MD 04/13/2013, 1:40  PM PGY-1, [place]

## 2013-04-12 NOTE — Progress Notes (Signed)
Family Medicine Teaching Service Daily Progress Note Intern Pager: [phone]  Patient name: [name] Medical record number: [id number] Date of birth: 04-Feb-1951 Age: 63 y.o. Gender: male  Primary Care Provider: Elizabeth Palau, MD Consultants: None Code Status: DNR  Pt Overview and Major Events to Date:   Assessment and Plan: [name] is a 63 y.o. male presenting with Dizziness for 2 weeks . PMH is significant for alcoholism, PVD, COPD, CAD with remote MI, and HTN. At this point his dizziness has an unclear etiology but is at least moderately concerning for occult stroke warranting admission for workup and close observation.   # Dizziness: Possible etiologies include Wernicke-Korsakoff syndrome, TIA, BPPV. CT head without acute abnormality identified. Partial Dix-Hallpike maneuver was positive for symptoms but not nystagmus. - restarted amlodipine last night with no increase in symptoms - Meclizine BID - PT/OT vestibular eval - does not appear to be BPPV - MRI brain with chronc changes (microvascular ischemic change)  # Alcohol abuse: possibly chronic changes of Korsakoff syndrome. Hyponatremia (133) on admission has resolved. - CIWA protocol and thiamine repletion (score 0 overnight) - Consult to CSW for substance abuse  # COPD: He is very unclear as to whether he has increased dyspnea above baseline, his cough appears to be at baseline  - Albuterol q4hrs as needed  - Needs formal PFT testing   # Diastolic heart failure  - again, unclear on dyspnea, some evidence of fluid overload with 1+ edema BL and BL crackles at the bases, stable 2 pillow orthopnea  - CXR with vascular congestion but not overt pulm edema, proBNP 74  - s/p lasix 40mg  IV 3/2 (3L output)  # Hypertension: reports previous BP of 977S systolic as outpatient and not being dizzy, started meds several weeks ago and then became dizzy. Overnight BPs elevated to 170s/60-80s, this morning 135/67. -  restarted amlodipine 5mg  last night   # CAD- with remote Hx of MI in the 90s  - No chest pain  - Risk labs : Lipid panel (LDL 142), A1C (pending), TSH (pending) - Start daily ASA and consider statin (ASCVD 47yr risk >7.5%)  # Right rib/flank pain: Unclear etiology, patient describes superficial pain with point tenderness over the ribs.  - Would consider CRPS at the site of an old rib fracture from alcohol-related fall as a possibility.  - Trial of Lidoderm patch, will follow  - worsened pain, will try flexeril  - plain films right ribs today  # PVD: s/p aorto bifemoral bypass and then BL fem-pop bypass  - no acute interventions, follow up OP   # Mood: suicidal comments on admission but denies any suicidal thoughts or wanting to hurt himself  - likely with underlying mood disorder, needs OP f/u   FEN/GI: Regular diet, Continued PPI  Prophylaxis: Sub q heparin  Disposition: discharge today  Subjective:  Patient improved this morning, still has some continued right side rib pain, doesn't think the flexeril helped. He is ready to go home.  Objective: Temp:  [97.7 F (36.5 C)-98.4 F (36.9 C)] 97.7 F (36.5 C) (03/04 0400) Pulse Rate:  [90-105] 92 (03/04 0400) BP: (113-174)/(60-97) 174/85 mmHg (03/04 0400) SpO2:  [96 %-99 %] 99 % (03/04 0400) Weight:  [240 lb (108.863 kg)] 240 lb (108.863 kg) (03/04 0400) Physical Exam: General: NAD, receiving nebulizer treatment Cardiovascular: RRR, normal heart sounds, no murmurs.  Chest wall: tenderness to lateral 12th rib on right side, no skin lesions or erythema of the area. Respiratory:  Clear to auscultation today, no wheezes/crackles appreciated, no increased WOB Abdomen: obese, soft, nontender, nondistended Extremities: 1+ pitting edema up to mid shins bilaterally. Neuro: A+Ox4, CN grossly normal (except for upward gaze), grip strength 5/5. No nystagmus noted on sitting or standing up. Gait is normal, unable to do heel to toe well.  Rhomberg negative.  Laboratory:  Recent Labs Lab 04/09/13 2253  WBC 11.3*  HGB 15.3  HCT 44.7  PLT 202    Recent Labs Lab 04/09/13 2253 04/10/13 0427  NA 133* 142  K 4.1 3.7  CL 96 103  CO2 22 24  BUN 16 13  CREATININE 0.76 0.75  CALCIUM 9.7 9.9  PROT  --  6.9  BILITOT  --  0.3  ALKPHOS  --  72  ALT  --  17  AST  --  19  GLUCOSE 120* 128*   Lipid Panel     Component Value Date/Time   CHOL 235* 04/10/2013 0427   TRIG 281* 04/10/2013 0427   HDL 37* 04/10/2013 0427   CHOLHDL 6.4 04/10/2013 0427   VLDL 56* 04/10/2013 0427   LDLCALC 142* 04/10/2013 0427   Ethanol 169 UDS negative Pro BNP 74.5 Mag 2.1 Phos 4.0  Imaging/Diagnostic Tests: Dg Chest 2 View (if Patient Has Fever And/or Copd)  04/09/2013   CLINICAL DATA:  Dizziness and shortness of breath  EXAM: CHEST  2 VIEW  COMPARISON:  12/12/2012  FINDINGS: Chronic cardiopericardial enlargement. Pulmonary venous congestion without edema or consolidation. No effusion or pneumothorax. Surgical clips at the esophageal hiatus. Cholecystectomy changes.  IMPRESSION: Pulmonary venous congestion without edema or consolidation.   Electronically Signed   By: Jorje Guild M.D.   On: 04/09/2013 23:55   Ct Head Wo Contrast  04/10/2013   CLINICAL DATA:  Headache.  Dizziness.  Shortness breath.  EXAM: CT HEAD WITHOUT CONTRAST  TECHNIQUE: Contiguous axial images were obtained from the base of the skull through the vertex without intravenous contrast.  COMPARISON:  12/22/2012.  FINDINGS: No intracranial hemorrhage.  No CT evidence of large acute infarct. If posterior fossa infarct is of high clinical concern, MR imaging may be considered for further delineation.  No hydrocephalus.  No intracranial mass lesion noted on this unenhanced exam.  Prior left orbital surgery with mesh in place.   IMPRESSION: No intracranial hemorrhage or CT evidence of large acute infarct.  Please see above.   Electronically Signed   By: Chauncey Cruel M.D.   On: 04/10/2013  00:41   Dg Ribs Unilateral Right  04/11/2013   CLINICAL DATA:  R rib pain  EXAM: RIGHT RIBS - 2 VIEW  COMPARISON:  DG CHEST 2 VIEW dated 04/09/2013  FINDINGS: No fracture or other bone lesions are seen involving the ribs.  IMPRESSION: Negative.   Electronically Signed   By: Margaree Mackintosh M.D.   On: 04/11/2013 13:14   [name] SW Contrast  04/11/2013   CLINICAL DATA:  Dizziness. Possible stroke. History of alcoholism. History of hypertension.  EXAM: MRI HEAD WITHOUT AND WITH CONTRAST  TECHNIQUE: Multiplanar, multiecho pulse sequences of the brain and surrounding structures were obtained without and with intravenous contrast.  CONTRAST:  55mL MULTIHANCE GADOBENATE DIMEGLUMINE 529 MG/ML IV SOLN  COMPARISON:  CT HEAD W/O CM dated 04/10/2013; CT MAXILLOFACIAL W/O CM dated 12/12/2012; CT HEAD W/O CM dated 12/12/2012  FINDINGS: The patient was unable to remain motionless for the exam. Small or subtle lesions could be overlooked.  Patient's left orbital floor fracture was repaired  with mesh which was compatible with MRI. No adverse features were encountered during scanning.  No evidence for acute infarction, hemorrhage, mass lesion, hydrocephalus, or extra-axial fluid. Premature for age cerebral and cerebellar atrophy. Mild to moderate subcortical and periventricular T2 and FLAIR hyperintensities, likely chronic microvascular ischemic change. Flow voids are maintained in the carotid, basilar, and vertebral arteries. There is a chronic hemorrhage right inferior temporal lobe which could be posttraumatic or reflect hypertensive cerebral vascular disease. Another area of susceptibility artifact representing calcification or ossification is seen in the left frontal convexity extra-axial compartment. Partial empty sella.  Post infusion, no abnormal enhancement of the brain or meninges. Mild chronic sinus disease. Posttraumatic changes left maxillary sinus related to left orbital blowout fracture. Possible left enophthalmos. No  mastoid fluid.  IMPRESSION: Chronic changes as described.  No acute intracranial findings.   Electronically Signed   By: Rolla Flatten M.D.   On: 04/11/2013 19:33    Tawanna Sat, MD 04/12/2013, 8:06 AM PGY-1, [place] Intern pager: [phone], text pages welcome

## 2013-04-12 NOTE — Discharge Instructions (Addendum)
You were admitted for dizziness. Our workup included testing your heart and images of the brain; the workup for these showed that you did not have any problems with your heart and no major issues with your brain such as cancer. The most likely cause of the dizziness is from chronic alcohol use that has caused some damage to your brain. This is not something that we can reverse, but you can prevent further damage by quitting alcohol. We also did x-rays of your right ribs and a trial of flexeril to help with your chronic side pain; the x-rays did not show any bone breaks.  Medications:  Atorvastatin (lipitor, a cholesterol lowering medication): take 80mg  once a day Baby Aspirin 81mg  daily Amlodipine 5mg  daily (take until directed to stop or increase by your primary doctor) Vitamin B1 (thiamine) daily vitamin Folic acid daily vitamin  You will also be given a small prescription for the medication you received in the hospital called Meclizine, you can take 1-2 tablets up to 3 times a day as needed for dizziness.      STROKE/TIA DISCHARGE INSTRUCTIONS SMOKING Cigarette smoking nearly doubles your risk of having a stroke & is the single most alterable risk factor  If you smoke or have smoked in the last 12 months, you are advised to quit smoking for your health.  Most of the excess cardiovascular risk related to smoking disappears within a year of stopping.  Ask you doctor about anti-smoking medications  [place] Quit Line: 1-800-QUIT NOW  Free Smoking Cessation Classes [phone]  CHOLESTEROL Know your levels; limit fat & cholesterol in your diet  Lipid Panel     Component Value Date/Time   CHOL 235* 04/10/2013 0427   TRIG 281* 04/10/2013 0427   HDL 37* 04/10/2013 0427   CHOLHDL 6.4 04/10/2013 0427   VLDL 56* 04/10/2013 0427   LDLCALC 142* 04/10/2013 0427      Many patients benefit from treatment even if their cholesterol is at goal.  Goal: Total Cholesterol (CHOL) less than 160  Goal:   Triglycerides (TRIG) less than 150  Goal:  HDL greater than 40  Goal:  LDL (LDLCALC) less than 100   BLOOD PRESSURE American Stroke Association blood pressure target is less that 120/80 mm/Hg  Your discharge blood pressure is:  BP: 135/67 mmHg  Monitor your blood pressure  Limit your salt and alcohol intake  Many individuals will require more than one medication for high blood pressure  DIABETES (A1c is a blood sugar average for last 3 months) Goal HGBA1c is under 7% (HBGA1c is blood sugar average for last 3 months)  Diabetes: No known diagnosis of diabetes    Lab Results  Component Value Date   HGBA1C 6.0* 04/10/2013     Your HGBA1c can be lowered with medications, healthy diet, and exercise.  Check your blood sugar as directed by your physician  Call your physician if you experience unexplained or low blood sugars.  PHYSICAL ACTIVITY/REHABILITATION Goal is 30 minutes at least 4 days per week  Activity: Increase activity slowly, Therapies:  Return to work:   Activity decreases your risk of heart attack and stroke and makes your heart stronger.  It helps control your weight and blood pressure; helps you relax and can improve your mood.  Participate in a regular exercise program.  Talk with your doctor about the best form of exercise for you (dancing, walking, swimming, cycling).  DIET/WEIGHT Goal is to maintain a healthy weight  Your discharge diet is:  Cardiac  Your height is:  Height: 6' (182.9 cm) Your current weight is: Weight: 108.863 kg (240 lb) Your Body Mass Index (BMI) is:  BMI (Calculated): 33.6  Following the type of diet specifically designed for you will help prevent another stroke.  Your goal weight range is:    Your goal Body Mass Index (BMI) is 19-24.  Healthy food habits can help reduce 3 risk factors for stroke:  High cholesterol, hypertension, and excess weight.  RESOURCES Stroke/Support Group:  Call [phone]   STROKE EDUCATION PROVIDED/REVIEWED  AND GIVEN TO PATIENT Stroke warning signs and symptoms How to activate emergency medical system (call 911). Medications prescribed at discharge. Need for follow-up after discharge. Personal risk factors for stroke. Pneumonia vaccine given: No Flu vaccine given: No My questions have been answered, the writing is legible, and I understand these instructions.  I will adhere to these goals & educational materials that have been provided to me after my discharge from the hospital.

## 2013-04-12 NOTE — Progress Notes (Signed)
At 2327 pt's Hr decreased to 30's nonsustained and pt had few beats of 2nd degree heart block type 2. Pt sleeping at the time. MD on call notified. No new orders. Cont to monitor.

## 2013-04-12 NOTE — Progress Notes (Signed)
This am Pts BP elevated to 174/85. Pt denies symptoms. MD on call notified per orders. Cont to monitor and will notify day shift.

## 2013-04-12 NOTE — Progress Notes (Signed)
Patient seen and examined by me today, he is walking around room without assistance.  Improvement in dizziness.  MRI report reviewed, discussed with patient.  I discussed plan with resident team and I agree with Dr Marissa Calamity assessment and plan for discharge today with outpatient followup.  Dalbert Mayotte, MD

## 2013-04-13 NOTE — Discharge Summary (Signed)
Patient seen and examined on day of discharge, discussed with resident team and I agree with their assessment and plan for discharge.  Dalbert Mayotte, MD

## 2013-04-16 DIAGNOSIS — H02059 Trichiasis without entropian unspecified eye, unspecified eyelid: Secondary | ICD-10-CM | POA: Insufficient documentation

## 2013-04-16 HISTORY — DX: Trichiasis without entropion unspecified eye, unspecified eyelid: H02.059

## 2013-06-05 ENCOUNTER — Emergency Department (HOSPITAL_COMMUNITY): Payer: Medicaid Other

## 2013-06-05 ENCOUNTER — Emergency Department (HOSPITAL_COMMUNITY)
Admission: EM | Admit: 2013-06-05 | Discharge: 2013-06-05 | Disposition: A | Discharge status: Home / Self Care | Payer: Medicaid Other | Attending: Emergency Medicine | Admitting: Emergency Medicine

## 2013-06-05 ENCOUNTER — Encounter (HOSPITAL_COMMUNITY): Payer: Self-pay | Admitting: Emergency Medicine

## 2013-06-05 ENCOUNTER — Other Ambulatory Visit: Payer: Self-pay

## 2013-06-05 DIAGNOSIS — I10 Essential (primary) hypertension: Secondary | ICD-10-CM | POA: Insufficient documentation

## 2013-06-05 DIAGNOSIS — Z8701 Personal history of pneumonia (recurrent): Secondary | ICD-10-CM | POA: Insufficient documentation

## 2013-06-05 DIAGNOSIS — I2510 Atherosclerotic heart disease of native coronary artery without angina pectoris: Secondary | ICD-10-CM | POA: Insufficient documentation

## 2013-06-05 DIAGNOSIS — K219 Gastro-esophageal reflux disease without esophagitis: Secondary | ICD-10-CM | POA: Insufficient documentation

## 2013-06-05 DIAGNOSIS — IMO0002 Reserved for concepts with insufficient information to code with codable children: Secondary | ICD-10-CM | POA: Insufficient documentation

## 2013-06-05 DIAGNOSIS — J441 Chronic obstructive pulmonary disease with (acute) exacerbation: Secondary | ICD-10-CM | POA: Insufficient documentation

## 2013-06-05 DIAGNOSIS — I252 Old myocardial infarction: Secondary | ICD-10-CM | POA: Insufficient documentation

## 2013-06-05 DIAGNOSIS — S0993XA Unspecified injury of face, initial encounter: Secondary | ICD-10-CM | POA: Insufficient documentation

## 2013-06-05 DIAGNOSIS — Y929 Unspecified place or not applicable: Secondary | ICD-10-CM | POA: Insufficient documentation

## 2013-06-05 DIAGNOSIS — Z79899 Other long term (current) drug therapy: Secondary | ICD-10-CM | POA: Insufficient documentation

## 2013-06-05 DIAGNOSIS — X58XXXA Exposure to other specified factors, initial encounter: Secondary | ICD-10-CM | POA: Insufficient documentation

## 2013-06-05 DIAGNOSIS — I509 Heart failure, unspecified: Secondary | ICD-10-CM | POA: Insufficient documentation

## 2013-06-05 DIAGNOSIS — Z86718 Personal history of other venous thrombosis and embolism: Secondary | ICD-10-CM | POA: Insufficient documentation

## 2013-06-05 DIAGNOSIS — J45901 Unspecified asthma with (acute) exacerbation: Secondary | ICD-10-CM

## 2013-06-05 DIAGNOSIS — Z9981 Dependence on supplemental oxygen: Secondary | ICD-10-CM | POA: Insufficient documentation

## 2013-06-05 DIAGNOSIS — Z7982 Long term (current) use of aspirin: Secondary | ICD-10-CM | POA: Insufficient documentation

## 2013-06-05 DIAGNOSIS — M129 Arthropathy, unspecified: Secondary | ICD-10-CM | POA: Insufficient documentation

## 2013-06-05 DIAGNOSIS — F17200 Nicotine dependence, unspecified, uncomplicated: Secondary | ICD-10-CM | POA: Insufficient documentation

## 2013-06-05 DIAGNOSIS — Z9889 Other specified postprocedural states: Secondary | ICD-10-CM | POA: Insufficient documentation

## 2013-06-05 DIAGNOSIS — S199XXA Unspecified injury of neck, initial encounter: Secondary | ICD-10-CM

## 2013-06-05 DIAGNOSIS — Y939 Activity, unspecified: Secondary | ICD-10-CM | POA: Insufficient documentation

## 2013-06-05 DIAGNOSIS — Z8659 Personal history of other mental and behavioral disorders: Secondary | ICD-10-CM | POA: Insufficient documentation

## 2013-06-05 DIAGNOSIS — S39012A Strain of muscle, fascia and tendon of lower back, initial encounter: Secondary | ICD-10-CM

## 2013-06-05 DIAGNOSIS — E785 Hyperlipidemia, unspecified: Secondary | ICD-10-CM | POA: Insufficient documentation

## 2013-06-05 DIAGNOSIS — G4733 Obstructive sleep apnea (adult) (pediatric): Secondary | ICD-10-CM | POA: Insufficient documentation

## 2013-06-05 LAB — CBC WITH DIFFERENTIAL/PLATELET
BASOS ABS: 0.1 10*3/uL (ref 0.0–0.1)
BASOS PCT: 1 % (ref 0–1)
EOS ABS: 0.3 10*3/uL (ref 0.0–0.7)
Eosinophils Relative: 5 % (ref 0–5)
HCT: 46.1 % (ref 39.0–52.0)
Hemoglobin: 15.3 g/dL (ref 13.0–17.0)
LYMPHS ABS: 2.5 10*3/uL (ref 0.7–4.0)
Lymphocytes Relative: 40 % (ref 12–46)
MCH: 30.4 pg (ref 26.0–34.0)
MCHC: 33.2 g/dL (ref 30.0–36.0)
MCV: 91.5 fL (ref 78.0–100.0)
Monocytes Absolute: 0.8 10*3/uL (ref 0.1–1.0)
Monocytes Relative: 13 % — ABNORMAL HIGH (ref 3–12)
NEUTROS PCT: 41 % — AB (ref 43–77)
Neutro Abs: 2.5 10*3/uL (ref 1.7–7.7)
PLATELETS: 187 10*3/uL (ref 150–400)
RBC: 5.04 MIL/uL (ref 4.22–5.81)
RDW: 14.8 % (ref 11.5–15.5)
WBC: 6.2 10*3/uL (ref 4.0–10.5)

## 2013-06-05 LAB — COMPREHENSIVE METABOLIC PANEL
ALBUMIN: 4 g/dL (ref 3.5–5.2)
ALK PHOS: 76 U/L (ref 39–117)
ALT: 12 U/L (ref 0–53)
AST: 15 U/L (ref 0–37)
BILIRUBIN TOTAL: 0.2 mg/dL — AB (ref 0.3–1.2)
BUN: 8 mg/dL (ref 6–23)
CHLORIDE: 105 meq/L (ref 96–112)
CO2: 24 mEq/L (ref 19–32)
Calcium: 9.5 mg/dL (ref 8.4–10.5)
Creatinine, Ser: 0.87 mg/dL (ref 0.50–1.35)
GFR calc Af Amer: >90 mL/min (ref >90)
GFR calc non Af Amer: 89 mL/min — ABNORMAL LOW (ref >90)
Glucose, Bld: 95 mg/dL (ref 70–99)
Potassium: 4.3 mEq/L (ref 3.7–5.3)
SODIUM: 143 meq/L (ref 137–147)
TOTAL PROTEIN: 7.4 g/dL (ref 6.0–8.3)

## 2013-06-05 LAB — I-STAT TROPONIN, ED: TROPONIN I, POC: 0 ng/mL (ref 0.00–0.08)

## 2013-06-05 LAB — PRO B NATRIURETIC PEPTIDE: Pro B Natriuretic peptide (BNP): 39.6 pg/mL (ref 0–125)

## 2013-06-05 MED ORDER — OXYCODONE-ACETAMINOPHEN 5-325 MG PO TABS: 2.0000 | ORAL_TABLET | Freq: Four times a day (QID) | ORAL | Status: DC | PRN

## 2013-06-05 MED ORDER — DIAZEPAM 5 MG/ML IJ SOLN: 5.0000 mg | Freq: Once | INTRAMUSCULAR | Status: DC

## 2013-06-05 MED ORDER — HYDROMORPHONE HCL PF 1 MG/ML IJ SOLN
1.0000 mg | Freq: Once | INTRAMUSCULAR | Status: AC
  Administered 2013-06-05: 1 mg via INTRAVENOUS
  Filled 2013-06-05: qty 1

## 2013-06-05 MED ORDER — ONDANSETRON HCL 4 MG/2ML IJ SOLN
4.0000 mg | Freq: Once | INTRAMUSCULAR | Status: AC
  Administered 2013-06-05: 4 mg via INTRAVENOUS
  Filled 2013-06-05: qty 2

## 2013-06-05 MED ORDER — IPRATROPIUM-ALBUTEROL 0.5-2.5 (3) MG/3ML IN SOLN
3.0000 mL | Freq: Once | RESPIRATORY_TRACT | Status: AC
  Administered 2013-06-05: 3 mL via RESPIRATORY_TRACT
  Filled 2013-06-05: qty 3

## 2013-06-05 NOTE — ED Provider Notes (Signed)
CSN: [id number]     Arrival date & time 06/05/13  1158 History   First MD Initiated Contact with Patient 06/05/13 1203     Chief Complaint  Patient presents with  . Back Pain  . Neck Pain     (Consider location/radiation/quality/duration/timing/severity/associated sxs/prior Treatment) The history is provided by the patient.  [name] is a 63 y.o. male hx of depression, HTN, MI, CHF COPD, aortobifemoral bypass here with back pain, neck pain. He woke up 4 days ago with upper back pain and neck pain. Its worse with movement. Denies trauma or injury. Mild shortness of breath that is unchanged. He has COPD and CHF at baseline. Denies chest pain. Does have some cough with whitish sputum but no fever. Denies abdominal pain. He was given aspirin 324 mg by EMS.    Past Medical History  Diagnosis Date  . Depression   . Hypertension   . Hyperlipidemia   . Myocardial infarction 2000  . CHF (congestive heart failure)   . COPD (chronic obstructive pulmonary disease)   . Peripheral vascular disease, unspecified     08/20/10 doppler: increase in right ABI post-op. Left ABI stable. S/P bi-fem bypass surgery  . GERD (gastroesophageal reflux disease)   . Asthma   . Anxiety   . History of DVT of lower extremity   . CAD (coronary artery disease) 05/27/10    Cath: severe single vessell CAD left cx midportion obtuse marginal 2 to 3.  . Shortness of breath   . Pneumonia 04/04/2012  . Arthritis   . OSA on CPAP   . Hyperlipemia   . COPD (chronic obstructive pulmonary disease)   . Alcohol abuse     H/O  . Tobacco abuse    Past Surgical History  Procedure Laterality Date  . Femoral bypass  08/19/10    Right Fem-Pop  . Aortogram w/ ptca  09/292003  . Eye surgery    . Back surgery    . Total knee arthroplasty    . Cholecystectomy    . Cardiac catheterization  05/27/10    severe CAD left cx  . Abdoninal ao angio & bifem angio  05/27/10    Patent graft, occluded bil stents with no retrograde  flow into the hypogastric arteries. 100% occl left ant. tibial artery, 70% to 80% to 100% stenosis right superficial fem artery above adductor canal. 100 % occl right ant. tibial vessell   Family History  Problem Relation Age of Onset  . Heart attack Mother   . Cirrhosis Father   . Heart failure Mother   . Heart failure Brother   . Cancer Brother    History  Substance Use Topics  . Smoking status: Current Every Day Smoker -- 1.50 packs/day for 45 years    Types: Cigarettes  . Smokeless tobacco: Never Used     Comment: refuses information  . Alcohol Use: Yes     Comment: occ    Review of Systems  Musculoskeletal: Positive for back pain and neck pain.  All other systems reviewed and are negative.     Allergies  Darvocet and Haldol  Home Medications   Prior to Admission medications   Medication Sig Start Date End Date Taking? Authorizing Provider  amLODipine (NORVASC) 5 MG tablet Take 1 tablet (5 mg total) by mouth daily. 04/12/13   Tawanna Sat, MD  aspirin 81 MG chewable tablet Chew 1 tablet (81 mg total) by mouth daily. 04/12/13   Tawanna Sat, MD  atorvastatin (LIPITOR)  80 MG tablet Take 1 tablet (80 mg total) by mouth daily at 6 PM. 04/12/13   Tawanna Sat, MD  folic acid (FOLVITE) 1 MG tablet Take 1 tablet (1 mg total) by mouth daily. 04/12/13   Tawanna Sat, MD  meclizine (ANTIVERT) 25 MG tablet Take 1 tablet (25 mg total) by mouth 3 (three) times daily as needed for dizziness. 04/12/13   Tawanna Sat, MD  Multiple Vitamin (MULTIVITAMIN WITH MINERALS) TABS tablet Take 1 tablet by mouth daily.    Historical Provider, MD  omeprazole (PRILOSEC) 20 MG capsule Take 20 mg by mouth daily.     Historical Provider, MD  thiamine 100 MG tablet Take 1 tablet (100 mg total) by mouth daily. 04/12/13   Tawanna Sat, MD   BP 141/90  Pulse 85  Temp(Src) 97.9 F (36.6 C) (Oral)  Resp 18  Wt 240 lb (108.863 kg)  SpO2 94% Physical Exam  Nursing note and vitals reviewed. Constitutional: He is  oriented to person, place, and time.  Uncomfortable, chronically ill   HENT:  Head: Normocephalic and atraumatic.  Mouth/Throat: Oropharynx is clear and moist.  Eyes: Conjunctivae are normal. Pupils are equal, round, and reactive to light.  Neck: Normal range of motion. Neck supple.  Bilateral paracervical tenderness   Cardiovascular: Normal rate, regular rhythm and normal heart sounds.   Pulmonary/Chest:  Slightly tachypneic. Mod air movement. Minimal wheezing   Abdominal: Soft. Bowel sounds are normal.  Previous midline scar well healed. No obvious bruit   Musculoskeletal: Normal range of motion.  Neurological: He is alert and oriented to person, place, and time. No cranial nerve deficit. Coordination normal.  Skin: Skin is warm and dry.  Psychiatric: He has a normal mood and affect. His behavior is normal. Judgment and thought content normal.    ED Course  Procedures (including critical care time) Labs Review Labs Reviewed  COMPREHENSIVE METABOLIC PANEL - Abnormal; Notable for the following:    Total Bilirubin 0.2 (*)    GFR calc non Af Amer 89 (*)    All other components within normal limits  CBC WITH DIFFERENTIAL - Abnormal; Notable for the following:    Neutrophils Relative % 41 (*)    Monocytes Relative 13 (*)    All other components within normal limits  PRO B NATRIURETIC PEPTIDE  CBC WITH DIFFERENTIAL  I-STAT TROPOININ, ED    Imaging Review Dg Chest 2 View  06/05/2013   CLINICAL DATA:  Pain between shoulder blades radiating down back, history hypertension, coronary artery disease post MI, CHF, COPD, asthma, smoking  EXAM: CHEST  2 VIEW  COMPARISON:  04/09/2013  FINDINGS: Enlargement of cardiac silhouette with pulmonary vascular congestion.  Mediastinal contours normal.  Bronchitic and emphysematous changes consistent with COPD.  No pulmonary Infiltrate, pleural effusion or pneumothorax.  Chronic pleural thickening at the lateral mid RIGHT chest unchanged.  IMPRESSION:  Enlargement of cardiac silhouette with pulmonary vascular congestion.  COPD changes.  No acute abnormalities.   Electronically Signed   By: Lavonia Dana M.D.   On: 06/05/2013 13:09   Dg Cervical Spine Complete  06/05/2013   CLINICAL DATA:  Pain  EXAM: CERVICAL SPINE  4+ VIEWS  COMPARISON:  None.  FINDINGS: Frontal, lateral, open-mouth odontoid, and bilateral oblique views were obtained. There is no fracture or spondylolisthesis. Prevertebral soft tissues and predental space regions are normal. There is ankylosis at C2-3. There is moderate disc space narrowing at C5-6 and C6-7. There are anterior osteophytes at all levels. There is  exit foraminal narrowing to varying degrees at all levels bilaterally. No erosive change.  IMPRESSION: Multifocal osteoarthritic change.  No fracture or spondylolisthesis.   Electronically Signed   By: Lowella Grip M.D.   On: 06/05/2013 13:10     EKG Interpretation None      MDM   Final diagnoses:  None    [name] is a 63 y.o. male here with back pain, neck pain. Likely MSK pain. Given hx of MI and COPD and CHF will get labs, CXR, BNP but low suspicion for CHF exacerbation or ACS. Will give pain meds and reassess.   2:49 PM BNP nl. cxr showed COPD. No fractures on neck xray. Likely muscle strain. Recommend prn percocet, NSAIDs. He still drinks alcohol so will hold muscle relaxants as it may sedate him too much.     Wandra Arthurs, MD 06/05/13 1450

## 2013-06-05 NOTE — Discharge Instructions (Signed)
Take motrin 600 mg every 6 hrs for pain.   For severe pain, take percocet as prescribed. Do NOT drive or drink alcohol with it.   Follow up with your doctor.   Return to ER if you have severe pain, chest pain, shortness of breath.

## 2013-06-05 NOTE — ED Notes (Signed)
Pt arrives via EMS from home with upper back and neck pain since Friday. Pt states pain has been gradually increasing, unable to move without pain, states he hasnt been sleeping well. Denies recent injury. Pt also reports productive white cough. Denies fever. 20g L Hand, 324mg  ASA PTA.

## 2013-07-26 ENCOUNTER — Ambulatory Visit (INDEPENDENT_AMBULATORY_CARE_PROVIDER_SITE_OTHER): Payer: Medicaid Other | Admitting: Family Medicine

## 2013-07-26 ENCOUNTER — Encounter: Payer: Self-pay | Admitting: Family Medicine

## 2013-07-26 VITALS — BP 147/92 | HR 100 | Temp 96.9°F | Resp 20 | Ht 72.05 in | Wt 247.0 lb

## 2013-07-26 DIAGNOSIS — Z720 Tobacco use: Secondary | ICD-10-CM

## 2013-07-26 DIAGNOSIS — I10 Essential (primary) hypertension: Secondary | ICD-10-CM

## 2013-07-26 DIAGNOSIS — F17200 Nicotine dependence, unspecified, uncomplicated: Secondary | ICD-10-CM

## 2013-07-26 DIAGNOSIS — R21 Rash and other nonspecific skin eruption: Secondary | ICD-10-CM

## 2013-07-26 DIAGNOSIS — J449 Chronic obstructive pulmonary disease, unspecified: Secondary | ICD-10-CM

## 2013-07-26 DIAGNOSIS — I739 Peripheral vascular disease, unspecified: Secondary | ICD-10-CM

## 2013-07-26 LAB — COMPREHENSIVE METABOLIC PANEL
ALBUMIN: 4.2 g/dL (ref 3.5–5.2)
ALT: 14 U/L (ref 0–53)
AST: 17 U/L (ref 0–37)
Alkaline Phosphatase: 68 U/L (ref 39–117)
BILIRUBIN TOTAL: 0.6 mg/dL (ref 0.2–1.2)
BUN: 10 mg/dL (ref 6–23)
CO2: 26 mEq/L (ref 19–32)
Calcium: 10 mg/dL (ref 8.4–10.5)
Chloride: 106 mEq/L (ref 96–112)
Creat: 1.06 mg/dL (ref 0.50–1.35)
GLUCOSE: 119 mg/dL — AB (ref 70–99)
POTASSIUM: 4.6 meq/L (ref 3.5–5.3)
SODIUM: 144 meq/L (ref 135–145)
TOTAL PROTEIN: 7.1 g/dL (ref 6.0–8.3)

## 2013-07-26 LAB — CBC WITH DIFFERENTIAL/PLATELET
BASOS PCT: 1 % (ref 0–1)
Basophils Absolute: 0.1 10*3/uL (ref 0.0–0.1)
EOS ABS: 0.3 10*3/uL (ref 0.0–0.7)
Eosinophils Relative: 4 % (ref 0–5)
HCT: 48 % (ref 39.0–52.0)
Hemoglobin: 16.5 g/dL (ref 13.0–17.0)
Lymphocytes Relative: 33 % (ref 12–46)
Lymphs Abs: 2.1 10*3/uL (ref 0.7–4.0)
MCH: 29.9 pg (ref 26.0–34.0)
MCHC: 34.4 g/dL (ref 30.0–36.0)
MCV: 87 fL (ref 78.0–100.0)
Monocytes Absolute: 0.8 10*3/uL (ref 0.1–1.0)
Monocytes Relative: 12 % (ref 3–12)
NEUTROS PCT: 50 % (ref 43–77)
Neutro Abs: 3.3 10*3/uL (ref 1.7–7.7)
PLATELETS: 177 10*3/uL (ref 150–400)
RBC: 5.52 MIL/uL (ref 4.22–5.81)
RDW: 15.4 % (ref 11.5–15.5)
WBC: 6.5 10*3/uL (ref 4.0–10.5)

## 2013-07-26 MED ORDER — ATORVASTATIN CALCIUM 40 MG PO TABS: 40.0000 mg | ORAL_TABLET | Freq: Every day | ORAL | Status: DC

## 2013-07-26 MED ORDER — LISINOPRIL 20 MG PO TABS: 20.0000 mg | ORAL_TABLET | Freq: Every day | ORAL | Status: DC

## 2013-07-26 MED ORDER — ALBUTEROL SULFATE HFA 108 (90 BASE) MCG/ACT IN AERS: 2.0000 | INHALATION_SPRAY | Freq: Four times a day (QID) | RESPIRATORY_TRACT | Status: DC | PRN

## 2013-07-26 MED ORDER — KETOCONAZOLE 2 % EX CREA: 1.0000 "application " | TOPICAL_CREAM | Freq: Every day | CUTANEOUS | Status: DC

## 2013-07-26 MED ORDER — ASPIRIN EC 81 MG PO TBEC: 81.0000 mg | DELAYED_RELEASE_TABLET | Freq: Every day | ORAL | Status: DC

## 2013-07-26 MED ORDER — HYDROCORTISONE 2.5 % EX CREA: TOPICAL_CREAM | Freq: Two times a day (BID) | CUTANEOUS | Status: DC

## 2013-07-26 MED ORDER — BREATHERITE COLL SPACER ADULT MISC: 1.0000 | Status: DC | PRN

## 2013-07-26 NOTE — Assessment & Plan Note (Signed)
Uncontrolled today with a BP of 147/92 Continue amlodipine 5 mg a day, previously hypotensive in the hospital Start lisinopril 20 mg a day with comorbid condition of PVD

## 2013-07-26 NOTE — Patient Instructions (Signed)
PLease come back in 2-3 weeks for HTN and depression  Please make an appt with Dr. Valentina Lucks for smoking cessation counseling and pulmonary function testing

## 2013-07-26 NOTE — Assessment & Plan Note (Addendum)
He has at least a 50-pack-year history of smoking He is currently interested in smoking but is failed Nicorette patches, feel that he'll need extensive counseling and so asked him to make an appointment with her clinical pharmacist. I also think he benefits from PFTs Albuterol prescription with spacer given today, discuss further treatment if he is using frequently.

## 2013-07-26 NOTE — Assessment & Plan Note (Signed)
Severe occlusive disease per Dr. Scot Dock, I will encourage him to followup as he previously wanted to. Started daily aspirin and Lipitor, previous LDL was 143 but given PVD it's clearly indicated. Encouraged and stressed tobacco cessation.

## 2013-07-26 NOTE — Assessment & Plan Note (Signed)
Encouraged cessation, Failed nicoderm Likely with mood disorder, plan to evaluate on f/u Consider wellbutrin

## 2013-07-26 NOTE — Progress Notes (Signed)
Patient ID: [name], male   DOB: 09-29-1950, 63 y.o.   MRN: [id number]  [name], MD Phone: [phone]  Subjective:  Chief complaint-noted  Pt Here to establish care. He is seen with his aide  He was admitted in March of 2015 for dizziness and unsteady gait. He had suspected Wernicke-Korsakoff encephalopathy at that time.  Today he complains of itching skin, "jock itch", hypertension, back pain, and several skin lesions.  Hypertension Denies chest pain, headache, and palpitation He's had dyspnea for quite a while has a current every day smoker now. He uses albuterol at home. He takes amlodipine 5 mg daily  Smoking  He smoked for about 50 years approximately one to 2 packs per day. She's recently cut down from 2 packs per day to one pack per day but switched from cigarettes to cigars.  Alcohol abuse He previously had severe alcohol abuse. He states for the last one to 2 months he has not drank in excess. He had a drink one time in the last month when he states that he had one beer and got very sick.   Back pain No bowel or bladder dysfunction, large extremity weakness, or paresthesias Is a history of DDD  I have reviewed and updated his past medical, surgical, and social history  ROS-  Peer HPI  Past Medical History Patient Active Problem List   Diagnosis Date Noted  . Dizziness 04/10/2013  . Pain in limb-Right Leg 08/10/2012  . Swelling of limb 08/10/2012  . Peripheral vascular disease, unspecified 07/27/2012  . PNA (pneumonia) 04/04/2012  . COPD (chronic obstructive pulmonary disease) 04/04/2012  . HTN (hypertension) 04/04/2012    Medications- reviewed and updated Current Outpatient Prescriptions  Medication Sig Dispense Refill  . albuterol (PROVENTIL HFA;VENTOLIN HFA) 108 (90 BASE) MCG/ACT inhaler Inhale 2 puffs into the lungs every 6 (six) hours as needed for wheezing or shortness of breath.  1 Inhaler  0  . amLODipine (NORVASC) 5 MG tablet Take  1 tablet (5 mg total) by mouth daily.  30 tablet  0  . aspirin EC 81 MG tablet Take 1 tablet (81 mg total) by mouth daily.  90 tablet  3  . atorvastatin (LIPITOR) 40 MG tablet Take 1 tablet (40 mg total) by mouth daily.  90 tablet  3  . hydrocortisone 2.5 % cream Apply topically 2 (two) times daily. To arms for itching, avoid groin, underarms, and face  30 g  0  . ketoconazole (NIZORAL) 2 % cream Apply 1 application topically daily. To groin  15 g  0  . lisinopril (PRINIVIL,ZESTRIL) 20 MG tablet Take 1 tablet (20 mg total) by mouth daily.  90 tablet  3  . oxyCODONE-acetaminophen (PERCOCET) 5-325 MG per tablet Take 2 tablets by mouth every 6 (six) hours as needed for moderate pain.  12 tablet  0  . Spacer/Aero-Holding Chambers (BREATHERITE COLL SPACER ADULT) MISC 1 Device by Does not apply route as needed (with inhaler).  1 each  0   No current facility-administered medications for this visit.    Objective: BP 147/92  Pulse 100  Temp(Src) 96.9 F (36.1 C) (Oral)  Resp 20  Ht 6' 0.05" (1.83 m)  Wt 247 lb (112.038 kg)  BMI 33.46 kg/m2  SpO2 96% Gen: NAD, alert, cooperative with exam, obese HEENT: NCAT CV: RRR, good S1/S2, no murmur Resp: Scattered wheezing, nonlabored Abd: Obese, SNTND, + BS Ext: 2+ bilateral pitting edema to the midcalf, 2+ DP pulses bilaterally Neuro: Alert and  oriented, No gross deficits Skin: Sun damaged skin bilateral arms neck and face, approximately 1 cm hyperkeratotic lesion on right dorsal hand, several flesh-colored scaly papules on bilateral arms and right ear   Assessment/Plan:  COPD (chronic obstructive pulmonary disease) He has at least a 50-pack-year history of smoking He is currently interested in smoking but is failed Nicorette patches, feel that he'll need extensive counseling and so asked him to make an appointment with her clinical pharmacist. I also think he benefits from PFTs Albuterol prescription with spacer given today, discuss further  treatment if he is using frequently.   HTN (hypertension) Uncontrolled today with a BP of 147/92 Continue amlodipine 5 mg a day, previously hypotensive in the hospital Start lisinopril 20 mg a day with comorbid condition of PVD  Peripheral vascular disease, unspecified Severe occlusive disease per Dr. Scot Dock, I will encourage him to followup as he previously wanted to. Started daily aspirin and Lipitor, previous LDL was 143 but given PVD it's clearly indicated. Encouraged and stressed tobacco cessation.     Tobacco abuse Encouraged cessation, Failed nicoderm Likely with mood disorder, plan to evaluate on f/u Consider wellbutrin    Rash and nonspecific skin eruption Clearly he has severely sun damaged skin, multiple AK's, 1 lesion suspicious for SCC on his right dorsal hand Complains of itching on bilateral arms and in groin, would like to be treated for "jock itch Ketoconazole for groin 2.5% hydrocortisone for arms Will need extensive dermatologic intervention, will discuss with him at the next visit whether he would like me to intervene or to see a dermatologist.    Orders Placed This Encounter  Procedures  . Comprehensive metabolic panel  . CBC with Differential    Meds ordered this encounter  Medications  . lisinopril (PRINIVIL,ZESTRIL) 20 MG tablet    Sig: Take 1 tablet (20 mg total) by mouth daily.    Dispense:  90 tablet    Refill:  3  . aspirin EC 81 MG tablet    Sig: Take 1 tablet (81 mg total) by mouth daily.    Dispense:  90 tablet    Refill:  3  . atorvastatin (LIPITOR) 40 MG tablet    Sig: Take 1 tablet (40 mg total) by mouth daily.    Dispense:  90 tablet    Refill:  3  . ketoconazole (NIZORAL) 2 % cream    Sig: Apply 1 application topically daily. To groin    Dispense:  15 g    Refill:  0  . hydrocortisone 2.5 % cream    Sig: Apply topically 2 (two) times daily. To arms for itching, avoid groin, underarms, and face    Dispense:  30 g    Refill:   0  . albuterol (PROVENTIL HFA;VENTOLIN HFA) 108 (90 BASE) MCG/ACT inhaler    Sig: Inhale 2 puffs into the lungs every 6 (six) hours as needed for wheezing or shortness of breath.    Dispense:  1 Inhaler    Refill:  0  . Spacer/Aero-Holding Chambers (BREATHERITE COLL SPACER ADULT) MISC    Sig: 1 Device by Does not apply route as needed (with inhaler).    Dispense:  1 each    Refill:  0

## 2013-07-26 NOTE — Assessment & Plan Note (Signed)
Clearly he has severely sun damaged skin, multiple AK's, 1 lesion suspicious for SCC on his right dorsal hand Complains of itching on bilateral arms and in groin, would like to be treated for "jock itch Ketoconazole for groin 2.5% hydrocortisone for arms Will need extensive dermatologic intervention, will discuss with him at the next visit whether he would like me to intervene or to see a dermatologist.

## 2013-07-28 ENCOUNTER — Encounter: Payer: Self-pay | Admitting: Family Medicine

## 2013-08-03 ENCOUNTER — Ambulatory Visit (INDEPENDENT_AMBULATORY_CARE_PROVIDER_SITE_OTHER): Payer: Medicaid Other | Admitting: Pharmacist

## 2013-08-03 ENCOUNTER — Encounter: Payer: Self-pay | Admitting: Pharmacist

## 2013-08-03 VITALS — BP 132/74 | HR 89 | Ht 72.5 in | Wt 250.0 lb

## 2013-08-03 DIAGNOSIS — Z720 Tobacco use: Secondary | ICD-10-CM

## 2013-08-03 DIAGNOSIS — F17200 Nicotine dependence, unspecified, uncomplicated: Secondary | ICD-10-CM

## 2013-08-03 MED ORDER — NORTRIPTYLINE HCL 25 MG PO CAPS: 25.0000 mg | ORAL_CAPSULE | Freq: Every day | ORAL | Status: DC

## 2013-08-03 NOTE — Progress Notes (Signed)
Patient ID: [name], male   DOB: 03-16-1950, 63 y.o.   MRN: [id number] Reviewed: Agree with Dr. Graylin Shiver documentation and management.

## 2013-08-03 NOTE — Patient Instructions (Signed)
Thank you for coming in today!  To help with stopping smoking, we are going to start nortriptyline 25 mg daily and Nicorette lozenge 4 mg every 1 hour. You can alternate the lozenges with the cigars you have right now.  Follow up with me mid-July.

## 2013-08-03 NOTE — Progress Notes (Signed)
S:  Patient arrives in good spirits with the assistance of a cane.   Patient arrives for evaluation of tobacco dependence and assistance with smoking cessation. He states that he has smoked full flavor cigarettes (2 packs per day) since he age 63. He has recently switched to cigars [facility]) and decreased to 1 pack per day. He reports failing multiple medications in the past to assist with tobacco cessation. He also states that he is ready to quit, but does not believe he has the willpower to do so without assistance.   Age when started using tobacco on a daily basis 63 years old. Number of Cigarettes per day 40 cigarettes. Brand smoked Rave, Marlboro, [place], Camel, including non-filters. Smokes first cigarette within 5 minutes after waking. Denies waking to smoke now. In the past would wake up 5-6 times a night to smoke when he had to urinate. Most recent quit attempt 6 weeks ago Longest time ever been tobacco free 1 day. What Medications (NRT, bupropion, varenicline) used in past includes patches (irritated skin), inhaler, Chantix (nightmares, trouble sleeping, nausea), Wellbutrin (smoked more) Rates IMPORTANCE of quitting tobacco on 1-10 scale of 10. Rates READINESS of quitting tobacco on 1-10 scale of 10. Rates CONFIDENCE of quitting tobacco on 1-10 scale of 0. Triggers to use tobacco include: coffee, boredom, sometimes meals   A/P: Severe Nicotine Dependence of 52 years duration in a patient who is a fair candidate for success. He has tried multiple medications in the past (including patches, inhaler, Chantix, and Wellbutrin) with no success. Plan to initiate nortriptyline 25 mg daily and lozenge (4 mg) every 1 hour. Nortriptyline will also help with sleeping at night. Discussed that he would be able to cross taper the lozenges with what cigars he has left. Counseled on purpose, appropriate use of lozenge, including avoid chewing, and adverse effects of nortriptyline, including urinary  retention. Provided tobacco education pamphlets.  Written information provided. Provided information on 1 800-QUIT NOW support program.  F/U Rx Clinic Visit mid-July. Total time in face-to-face counseling 30 minutes.  Patient seen with Whitney Muse, PharmD Candidate;  Juliene Pina,  PharmD Resident; Gwendlyn Deutscher, PharmD Resident.

## 2013-08-03 NOTE — Assessment & Plan Note (Signed)
Severe Nicotine Dependence of 52 years duration in a patient who is a fair candidate for success. He has tried multiple medications in the past (including patches, inhaler, Chantix, and Wellbutrin) with no success. Plan to initiate nortriptyline 25 mg daily and lozenge (4 mg) every 1 hour. Nortriptyline will also help with sleeping at night. Discussed that he would be able to cross taper the lozenges with what cigars he has left. Counseled on purpose, appropriate use of lozenge, including avoid chewing, and adverse effects of nortriptyline, including urinary retention. Provided tobacco education pamphlets.  Written information provided. Provided information on 1 800-QUIT NOW support program.  F/U Rx Clinic Visit mid-July. Total time in face-to-face counseling 30 minutes.  Patient seen with Whitney Muse, PharmD Candidate;  Juliene Pina,  PharmD Resident; Gwendlyn Deutscher, PharmD Resident.

## 2013-08-10 ENCOUNTER — Encounter: Payer: Self-pay | Admitting: Family Medicine

## 2013-08-10 DIAGNOSIS — M109 Gout, unspecified: Secondary | ICD-10-CM | POA: Insufficient documentation

## 2013-08-10 DIAGNOSIS — K219 Gastro-esophageal reflux disease without esophagitis: Secondary | ICD-10-CM | POA: Insufficient documentation

## 2013-08-14 ENCOUNTER — Ambulatory Visit: Payer: Medicaid Other | Admitting: Family Medicine

## 2013-08-14 ENCOUNTER — Encounter: Payer: Self-pay | Admitting: Family Medicine

## 2013-08-14 DIAGNOSIS — M10079 Idiopathic gout, unspecified ankle and foot: Secondary | ICD-10-CM

## 2013-08-29 ENCOUNTER — Ambulatory Visit: Payer: Medicaid Other | Admitting: Pharmacist

## 2013-09-14 ENCOUNTER — Ambulatory Visit: Payer: Medicaid Other | Admitting: Family Medicine

## 2013-10-02 ENCOUNTER — Telehealth: Payer: Self-pay | Admitting: Family Medicine

## 2013-10-02 DIAGNOSIS — I739 Peripheral vascular disease, unspecified: Secondary | ICD-10-CM

## 2013-10-02 DIAGNOSIS — I10 Essential (primary) hypertension: Secondary | ICD-10-CM

## 2013-10-02 MED ORDER — LISINOPRIL 20 MG PO TABS: 20.0000 mg | ORAL_TABLET | Freq: Every day | ORAL | Status: DC

## 2013-10-02 MED ORDER — ATORVASTATIN CALCIUM 40 MG PO TABS: 40.0000 mg | ORAL_TABLET | Freq: Every day | ORAL | Status: DC

## 2013-10-02 NOTE — Telephone Encounter (Signed)
Forward to PCP for refill request 

## 2013-10-02 NOTE — Telephone Encounter (Signed)
Pt called and needs a refill on his lisinopril and atorvastatin called in. jw

## 2013-10-02 NOTE — Telephone Encounter (Signed)
Refilled meds, will ask team to inform.   Laroy Apple, MD [place] Resident, PGY-3 10/02/2013, 4:56 PM

## 2013-10-03 NOTE — Telephone Encounter (Signed)
Left message on voicemail asking that patient call back to schedule a follow up appt.

## 2013-10-07 IMAGING — CR DG CHEST 2V
2 series · 2 of 2 positions shown · non-contrast
Comparison: 08/22/2010, 08/18/2010 and 08/08/2010

CLINICAL DATA: Essential hypertension, chronic airway obstruction.

CHEST - 2 VIEW

[view not recorded (1 of 2)]
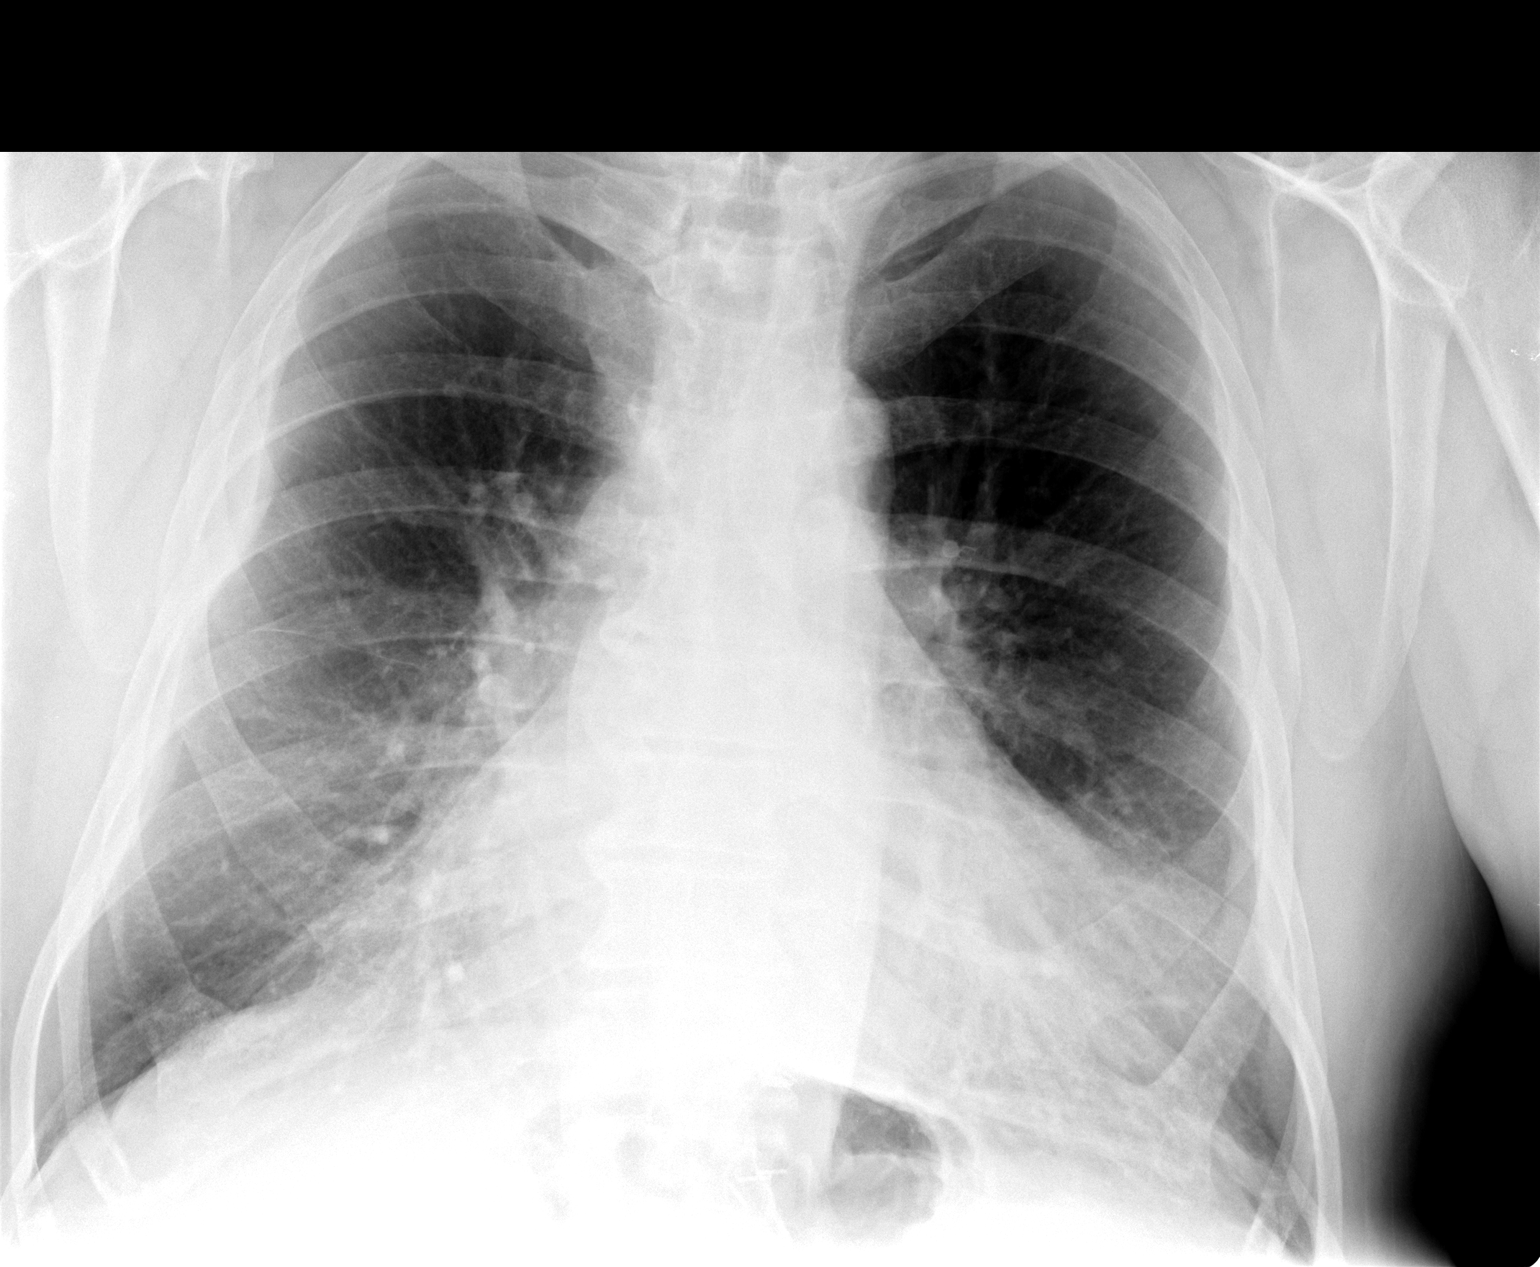

[view not recorded (2 of 2)]
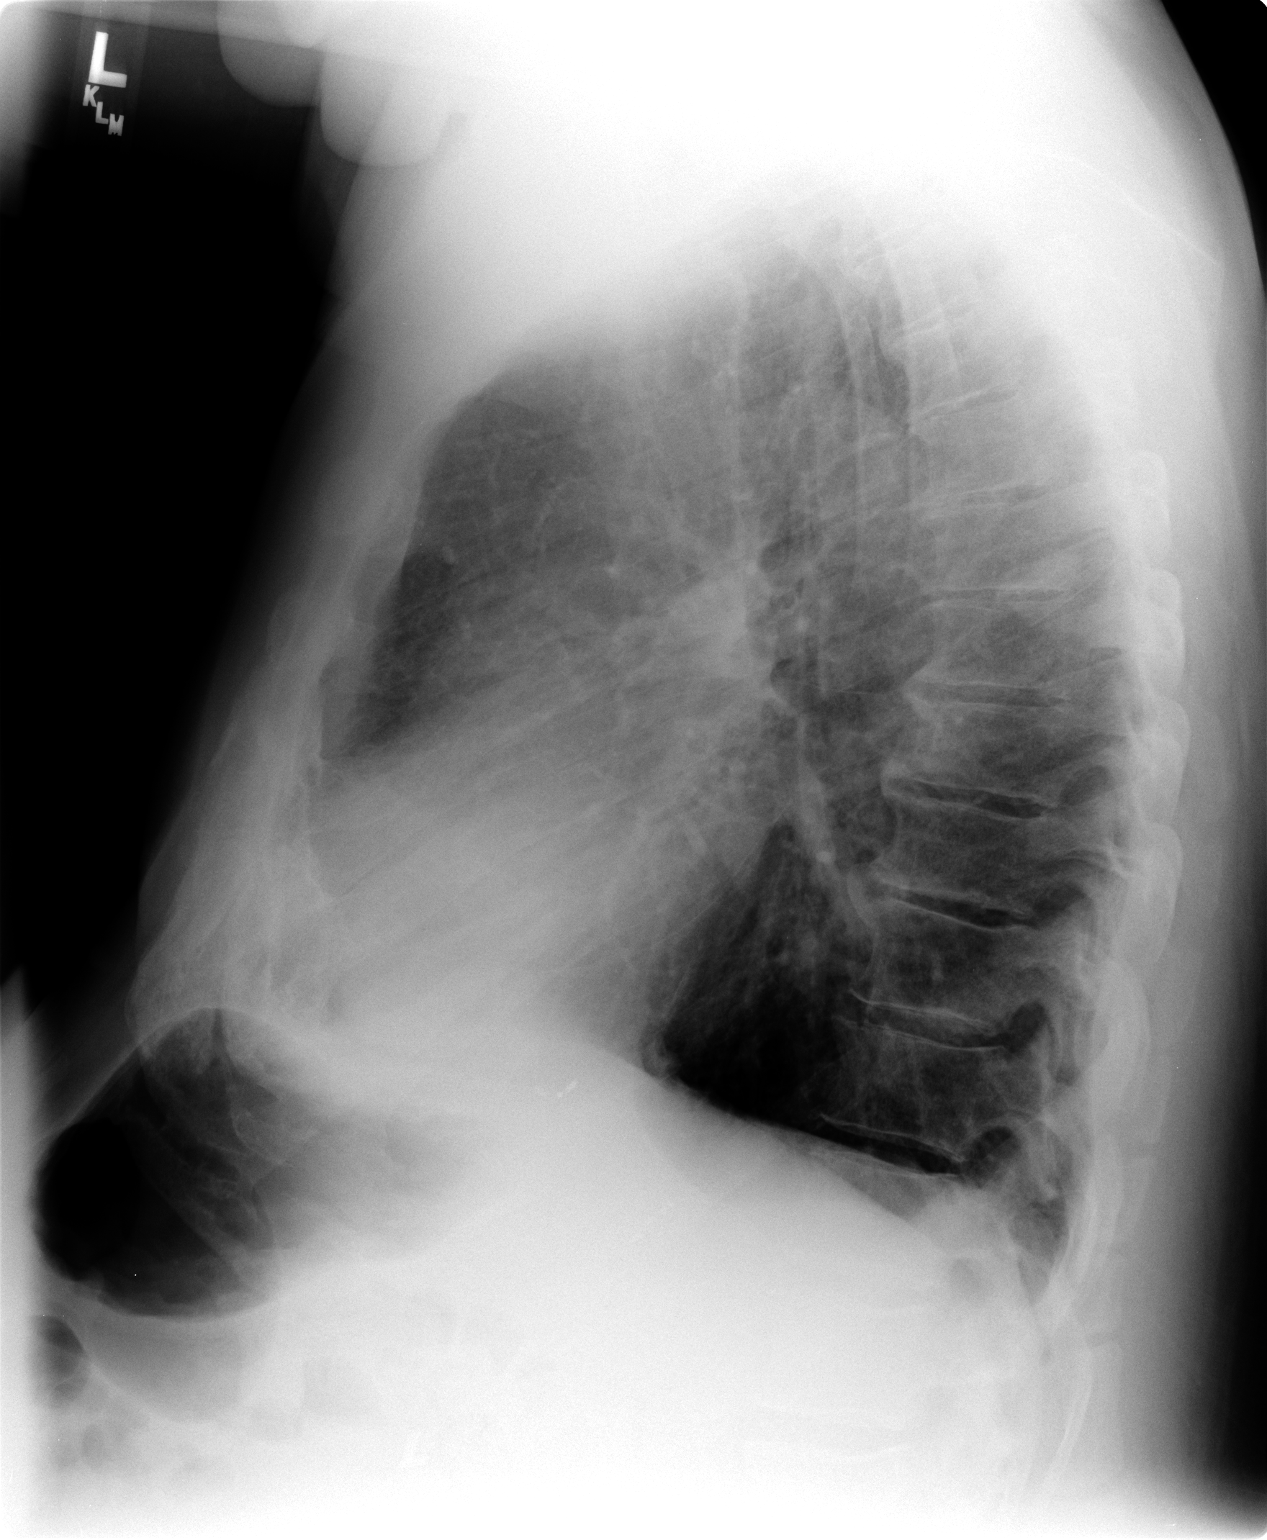

[2 of 2 positions shown; findings below may reference images not displayed]

FINDINGS: Lungs are adequately inflated without focal consolidation
or effusion.  There is mild prominence of the infrahilar markings
which may be due to mild vascular congestion.  There is stable
cardiomegaly.  Remainder of the exam is unchanged.
IMPRESSION: Findings suggesting mild vascular congestion.  Stable cardiomegaly.

## 2013-10-15 IMAGING — CR DG LUMBAR SPINE COMPLETE 4+V
5 series · 5 of 5 positions shown · non-contrast
Comparison: 01/02/2011

CLINICAL DATA: Low back pain.  No trauma.

LUMBAR SPINE - COMPLETE 4+ VIEW

[t l-spine a.p.]
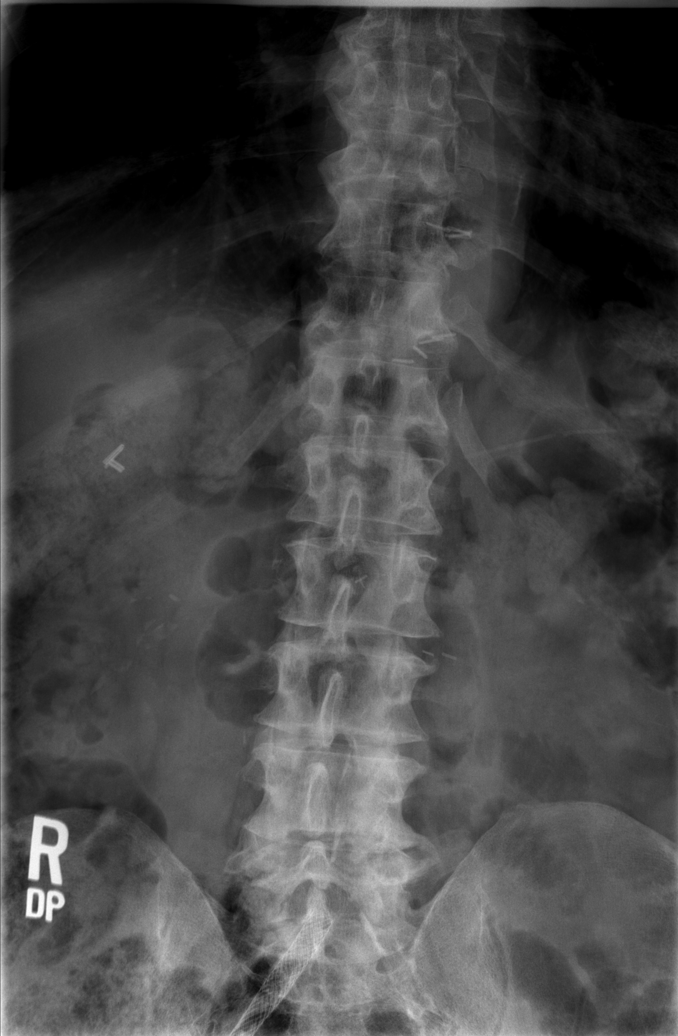

[t l-spine oblique exposure (1 of 2)]
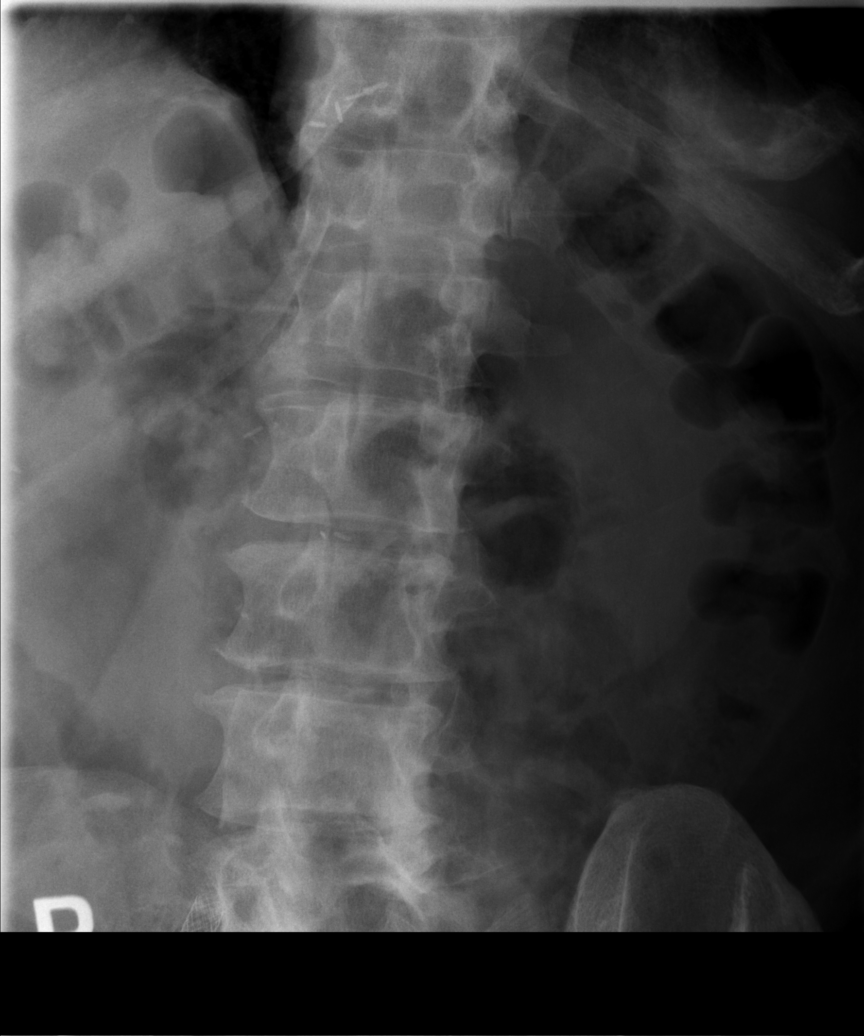

[t l-spine oblique exposure (2 of 2)]
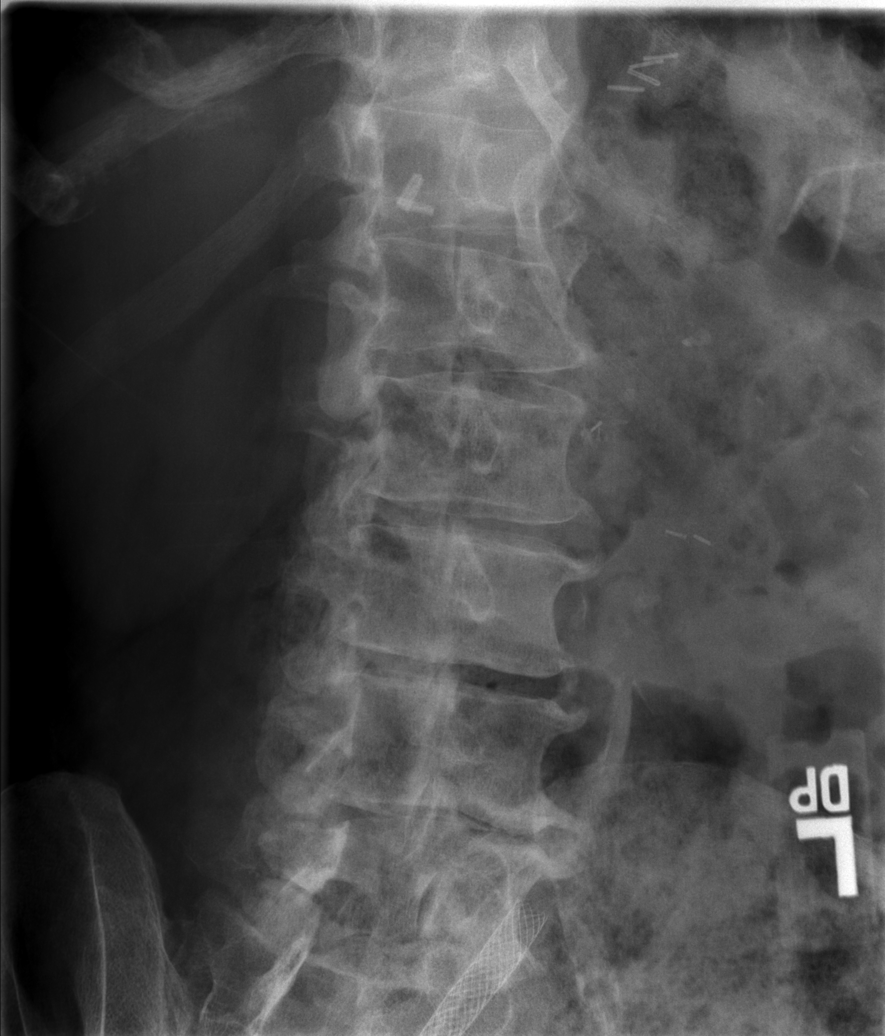

[t l-spine lat]
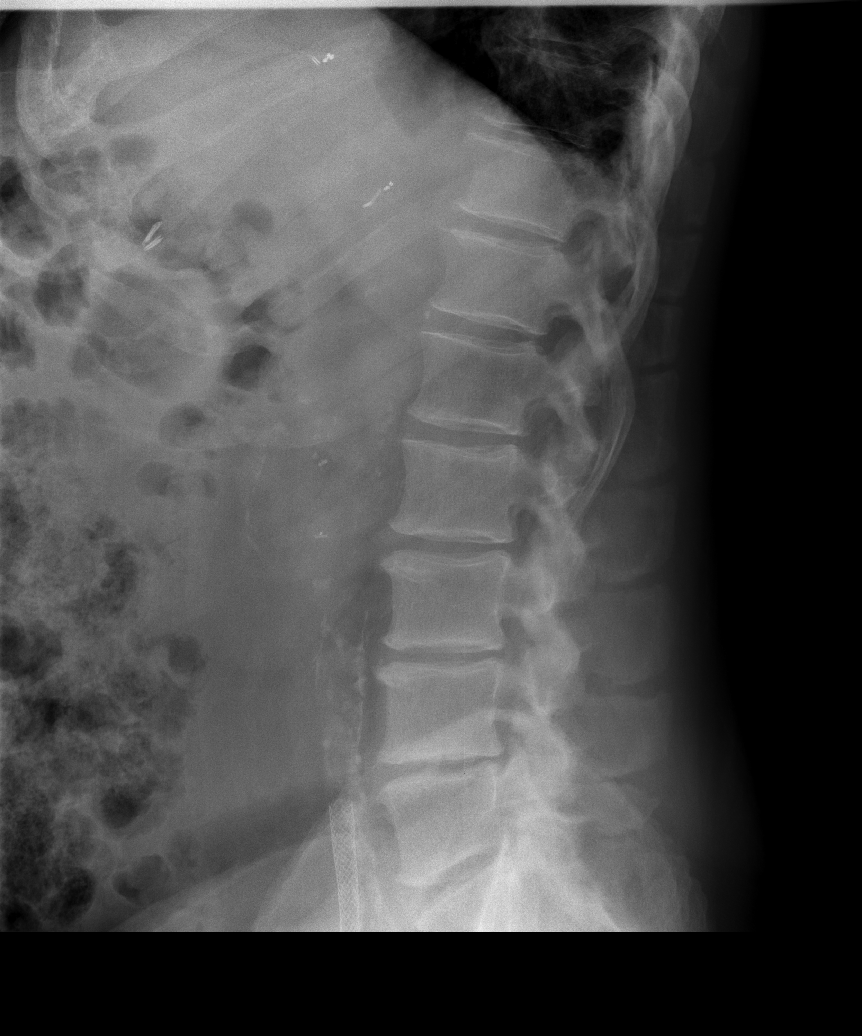

[t l-spine l5-s1 spot]
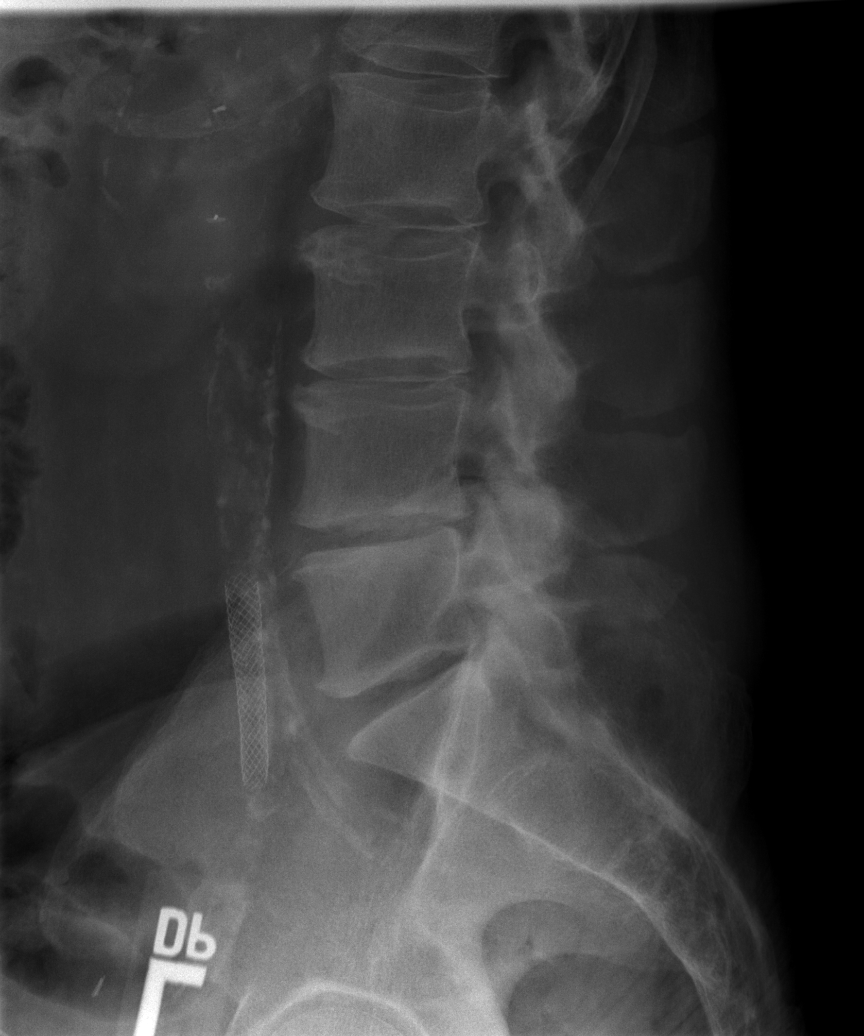

[5 of 5 positions shown; findings below may reference images not displayed]

FINDINGS: Examination demonstrates normal vertebral body alignment
and heights.  There is moderate spondylosis throughout the lumbar
spine to include moderate facet arthropathy.  Disc space narrowing
is present from the L2-3 level through the L5-L1 level as this is
most prominent at the L3-4 and L4-5 levels without significant
change.  There is no compression fracture or subluxation.
Remainder of the exam to include a right common iliac artery stent
is unchanged.
IMPRESSION: Moderate spondylosis with moderate multilevel degenerative disc
disease most prominent at the L3-4 and L4-5 levels without
significant change.  No acute findings.

## 2013-10-15 IMAGING — CR DG THORACIC SPINE 3V
3 series · 3 of 3 positions shown · non-contrast
Comparison: Chest x-ray 01/04/2012

CLINICAL DATA: Upper back and left shoulder pain.  No trauma.

THORACIC SPINE - 2 VIEW + SWIMMERS

[t t-spine a.p.]
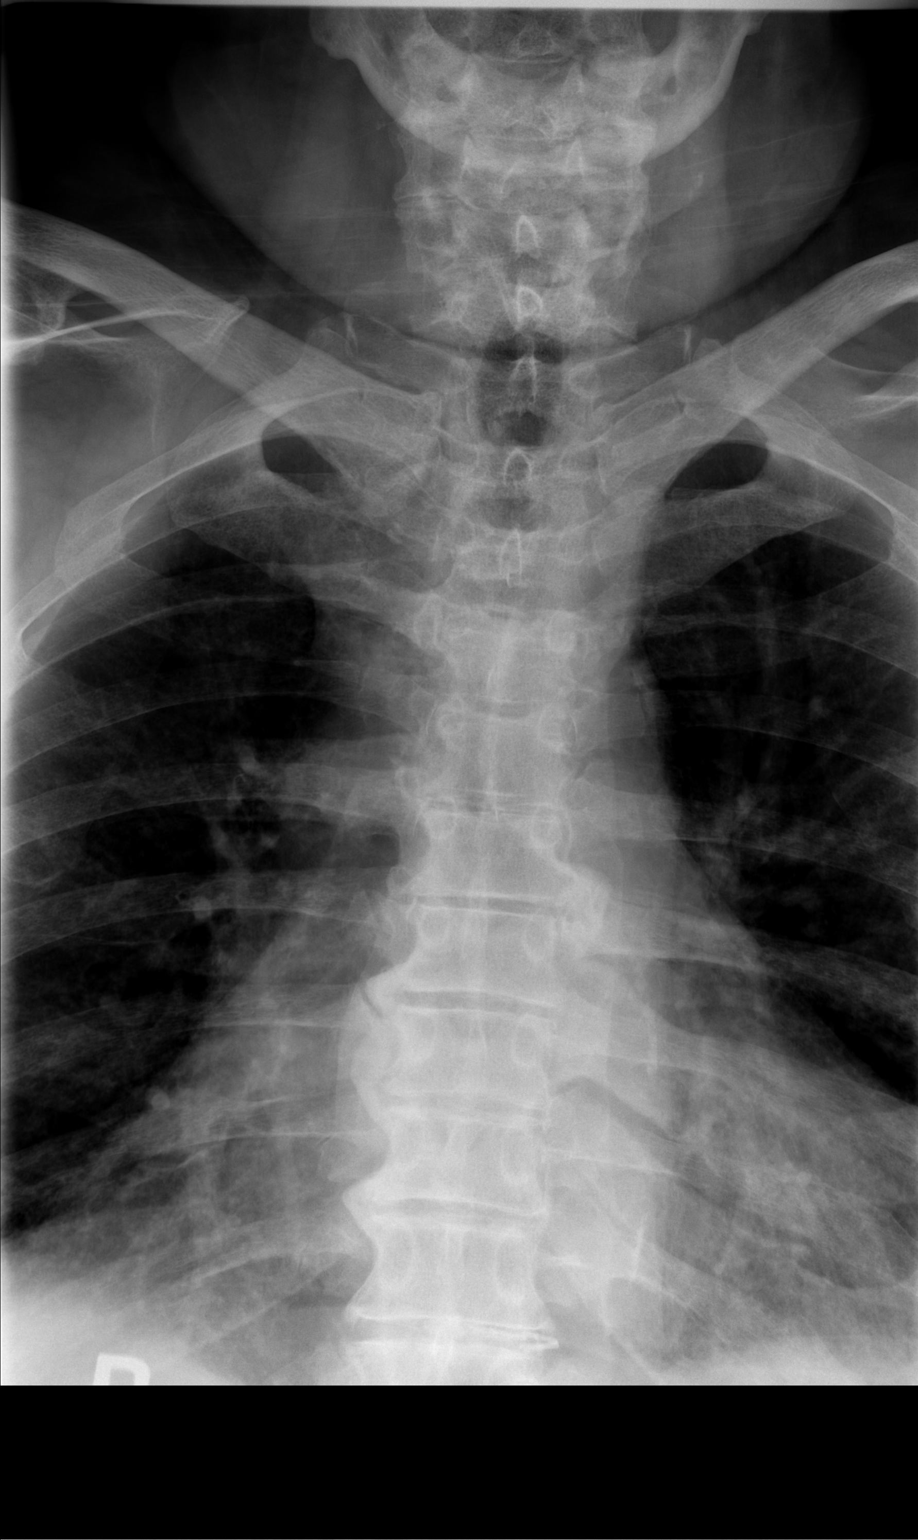

[t t-spine lat]
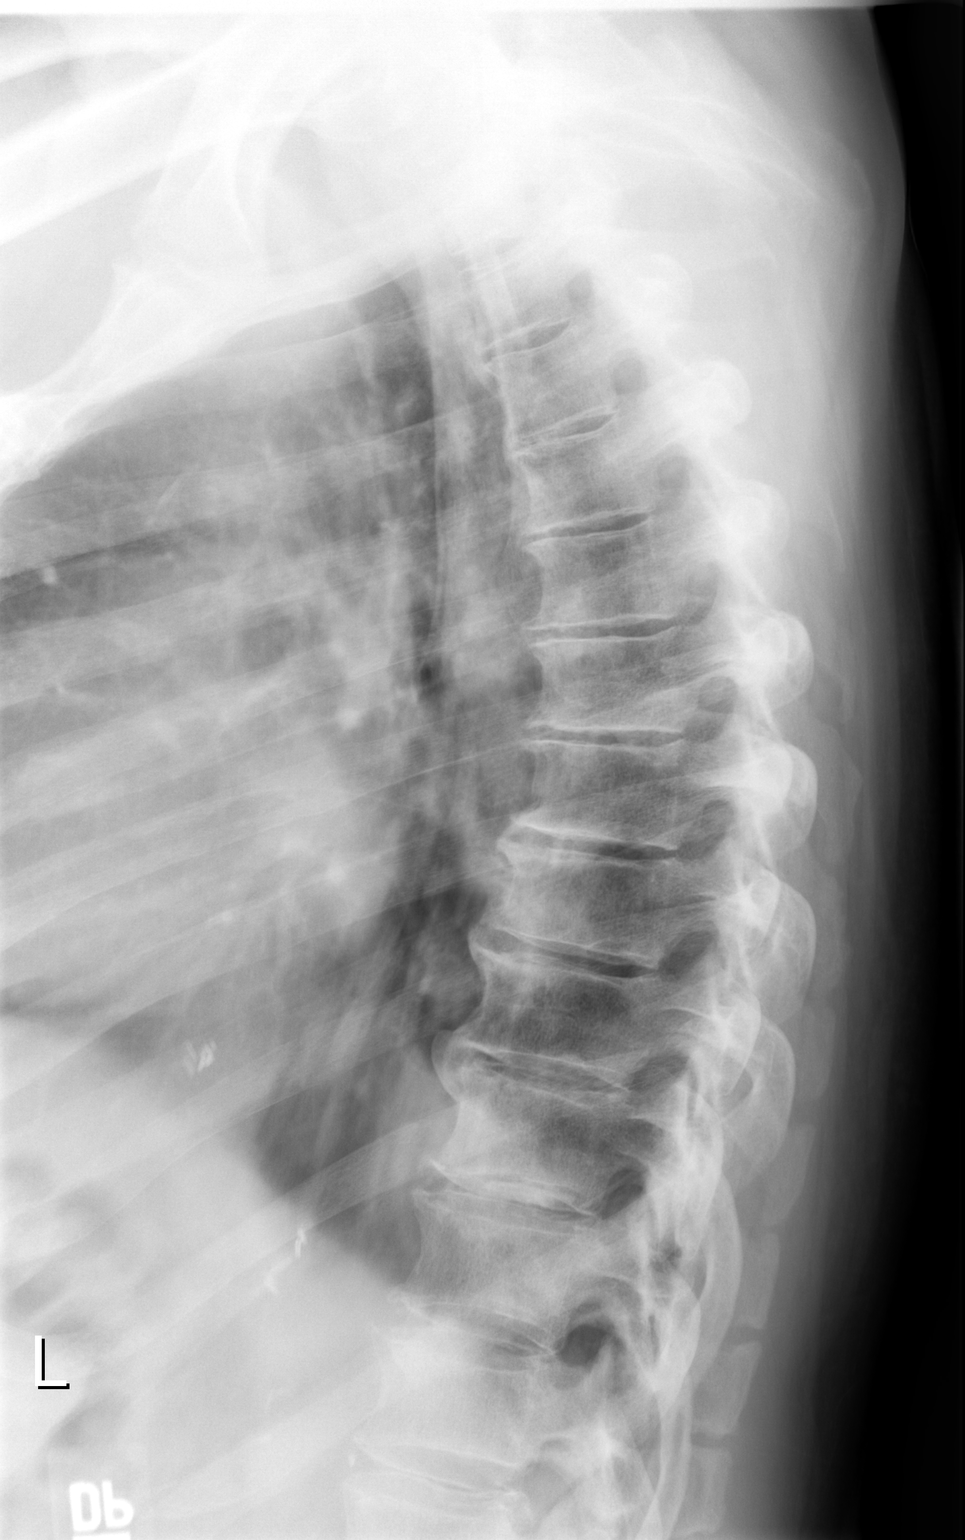

[t swimmers]
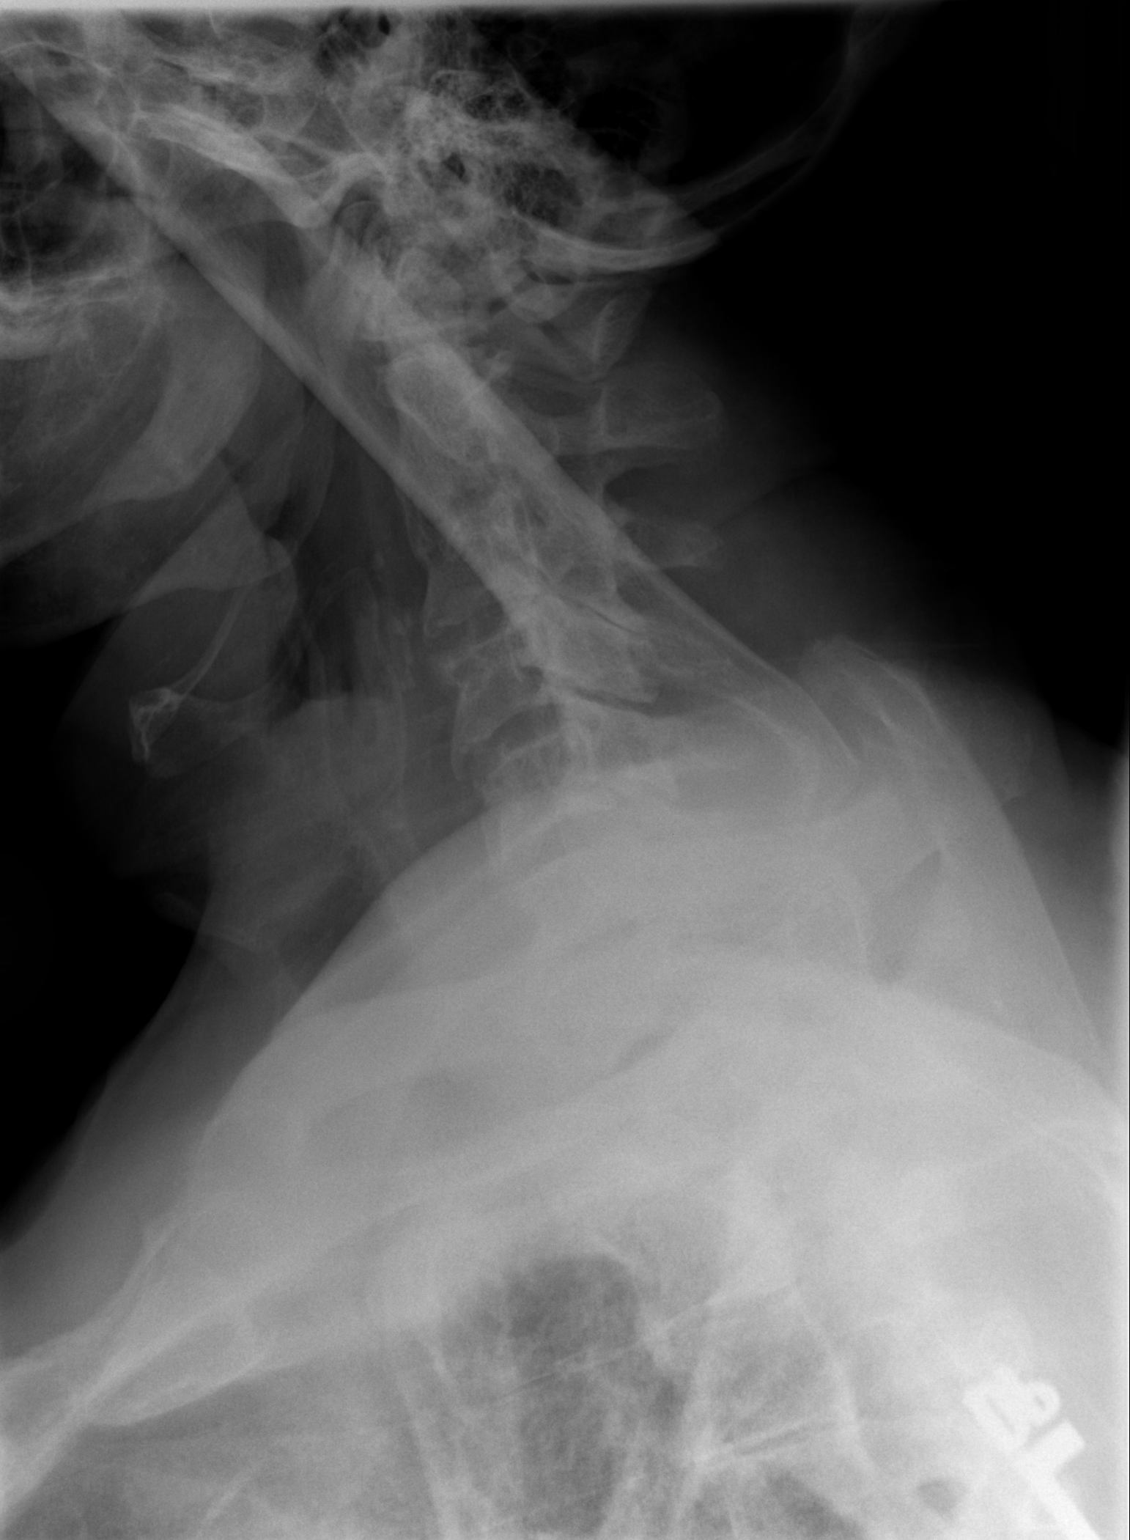

[3 of 3 positions shown; findings below may reference images not displayed]

FINDINGS: Vertebral body alignment and heights are within normal.
There is mild moderate spondylosis throughout the thoracic spine.
There is no compression fracture or subluxation.  There is moderate
spondylosis of the visualized cervical spine.  Pedicles are intact.
IMPRESSION: Moderate spondylosis of the thoracic spine.  No acute findings.

## 2013-11-21 ENCOUNTER — Ambulatory Visit (INDEPENDENT_AMBULATORY_CARE_PROVIDER_SITE_OTHER): Payer: Medicaid Other | Admitting: Family Medicine

## 2013-11-21 ENCOUNTER — Encounter: Payer: Self-pay | Admitting: Family Medicine

## 2013-11-21 VITALS — BP 188/93 | HR 101 | Temp 98.4°F | Wt 261.0 lb

## 2013-11-21 DIAGNOSIS — J449 Chronic obstructive pulmonary disease, unspecified: Secondary | ICD-10-CM

## 2013-11-21 DIAGNOSIS — R0602 Shortness of breath: Secondary | ICD-10-CM

## 2013-11-21 DIAGNOSIS — I2510 Atherosclerotic heart disease of native coronary artery without angina pectoris: Secondary | ICD-10-CM

## 2013-11-21 DIAGNOSIS — I5032 Chronic diastolic (congestive) heart failure: Secondary | ICD-10-CM | POA: Insufficient documentation

## 2013-11-21 DIAGNOSIS — I5033 Acute on chronic diastolic (congestive) heart failure: Secondary | ICD-10-CM

## 2013-11-21 DIAGNOSIS — F1010 Alcohol abuse, uncomplicated: Secondary | ICD-10-CM

## 2013-11-21 LAB — COMPLETE METABOLIC PANEL WITH GFR
ALT: 24 U/L (ref 0–53)
AST: 25 U/L (ref 0–37)
Albumin: 4 g/dL (ref 3.5–5.2)
Alkaline Phosphatase: 65 U/L (ref 39–117)
BUN: 13 mg/dL (ref 6–23)
CO2: 26 mEq/L (ref 19–32)
CREATININE: 0.96 mg/dL (ref 0.50–1.35)
Calcium: 9.8 mg/dL (ref 8.4–10.5)
Chloride: 108 mEq/L (ref 96–112)
GFR, Est African American: >89 mL/min
GFR, Est Non African American: 83 mL/min
Glucose, Bld: 95 mg/dL (ref 70–99)
POTASSIUM: 4.7 meq/L (ref 3.5–5.3)
Sodium: 143 mEq/L (ref 135–145)
Total Bilirubin: 0.4 mg/dL (ref 0.2–1.2)
Total Protein: 6.6 g/dL (ref 6.0–8.3)

## 2013-11-21 LAB — CBC
HCT: 45.3 % (ref 39.0–52.0)
Hemoglobin: 15.6 g/dL (ref 13.0–17.0)
MCH: 30.8 pg (ref 26.0–34.0)
MCHC: 34.4 g/dL (ref 30.0–36.0)
MCV: 89.3 fL (ref 78.0–100.0)
Platelets: 167 10*3/uL (ref 150–400)
RBC: 5.07 MIL/uL (ref 4.22–5.81)
RDW: 16.2 % — AB (ref 11.5–15.5)
WBC: 5 10*3/uL (ref 4.0–10.5)

## 2013-11-21 MED ORDER — TIOTROPIUM BROMIDE MONOHYDRATE 18 MCG IN CAPS: 18.0000 ug | ORAL_CAPSULE | Freq: Every day | RESPIRATORY_TRACT | Status: DC

## 2013-11-21 MED ORDER — MOMETASONE FURO-FORMOTEROL FUM 200-5 MCG/ACT IN AERO: 2.0000 | INHALATION_SPRAY | Freq: Two times a day (BID) | RESPIRATORY_TRACT | Status: DC

## 2013-11-21 MED ORDER — POTASSIUM CHLORIDE ER 10 MEQ PO TBCR: 20.0000 meq | EXTENDED_RELEASE_TABLET | Freq: Every day | ORAL | Status: DC

## 2013-11-21 MED ORDER — ALBUTEROL SULFATE HFA 108 (90 BASE) MCG/ACT IN AERS: 2.0000 | INHALATION_SPRAY | Freq: Four times a day (QID) | RESPIRATORY_TRACT | Status: DC | PRN

## 2013-11-21 MED ORDER — FUROSEMIDE 40 MG PO TABS: 40.0000 mg | ORAL_TABLET | Freq: Every day | ORAL | Status: DC

## 2013-11-21 NOTE — Progress Notes (Signed)
   Subjective:    Patient ID: [name], male    DOB: 1950/12/16, 63 y.o.   MRN: [id number]  HPI 63 year old male with COPD, PVD, Tobacco abuse, HTN, Alcohol abuse, and Diastolic CHF presents for a same day appointment with complaints of SOB.  1) SOB  Patient reports that he has chronic SOB but that it has been worsening for the past week.  SOB is primarily with exertion.  No recent chest pain.  No PND or orthopnea.  He does, however, note that his LE edema has been progressively worsening.  He also notes productive cough but states that this is more or less at baseline.    He denies associated fevers, chills.  He has been using Albuterol with little improvement.  He is not out of this and has been for 3-4 days.  Review of Systems Per HPI    Objective:   Physical Exam Filed Vitals:   11/21/13 1153  BP: 188/93  Pulse: 101  Temp: 98.4 F (36.9 C)   Exam: General: chronically ill appearing male, sitting up in chair. Cardiovascular: Tachycardic. No murmurs, rubs, or gallops. Respiratory: CTAB. No rhonchi, wheezing or rales noted.  Normal work of breathing. Speaking in full sentences. Extremities: 3+ pitting LE edema extending up to the thighs.    Assessment & Plan:  See Problem List

## 2013-11-21 NOTE — Patient Instructions (Signed)
It was nice to see you today.  I am starting you on Lasix.  You need to follow up with your PCP ASAP.  If you worsen or fail to improve please go to the ED.

## 2013-11-21 NOTE — Assessment & Plan Note (Signed)
Refilled Albuterol and spiriva. Added Dulera today.

## 2013-11-21 NOTE — Assessment & Plan Note (Addendum)
Lungs clear to auscultation today (making acute COPD exacerbation unlikely). This appears to be secondary to CHF given profound LE edema and recent weight gain (11 lbs since June). No hypoxemia today.  Case was precepted with attending Dr. Erin Hearing. Obtaining labs today including BNP. Will plan to diurese with Lasix 40 mg daily and have patient follow up this week.

## 2013-11-22 LAB — PRO B NATRIURETIC PEPTIDE: PRO B NATRI PEPTIDE: 164.9 pg/mL — AB (ref <126)

## 2013-11-23 ENCOUNTER — Encounter: Payer: Self-pay | Admitting: Family Medicine

## 2013-11-23 ENCOUNTER — Ambulatory Visit (INDEPENDENT_AMBULATORY_CARE_PROVIDER_SITE_OTHER): Payer: Medicaid Other | Admitting: Family Medicine

## 2013-11-23 ENCOUNTER — Telehealth: Payer: Self-pay | Admitting: *Deleted

## 2013-11-23 VITALS — BP 168/70 | HR 98 | Temp 98.3°F | Ht 72.5 in | Wt 252.1 lb

## 2013-11-23 DIAGNOSIS — J449 Chronic obstructive pulmonary disease, unspecified: Secondary | ICD-10-CM

## 2013-11-23 DIAGNOSIS — I10 Essential (primary) hypertension: Secondary | ICD-10-CM

## 2013-11-23 DIAGNOSIS — I5033 Acute on chronic diastolic (congestive) heart failure: Secondary | ICD-10-CM

## 2013-11-23 LAB — BASIC METABOLIC PANEL
BUN: 18 mg/dL (ref 6–23)
CHLORIDE: 102 meq/L (ref 96–112)
CO2: 27 meq/L (ref 19–32)
Calcium: 10.3 mg/dL (ref 8.4–10.5)
Creat: 1 mg/dL (ref 0.50–1.35)
Glucose, Bld: 126 mg/dL — ABNORMAL HIGH (ref 70–99)
Potassium: 4.2 mEq/L (ref 3.5–5.3)
SODIUM: 141 meq/L (ref 135–145)

## 2013-11-23 MED ORDER — PREDNISONE 50 MG PO TABS: 50.0000 mg | ORAL_TABLET | Freq: Every day | ORAL | Status: DC

## 2013-11-23 MED ORDER — DOXYCYCLINE HYCLATE 100 MG PO TABS: 100.0000 mg | ORAL_TABLET | Freq: Two times a day (BID) | ORAL | Status: DC

## 2013-11-23 NOTE — Patient Instructions (Addendum)
It was nice to see you today.   Please continue taking Lasix.  Be sure to take the potassium as well.  Go to the pharmacy and get antibiotic, prednisone, and your inhalers.  Follow up appointment Wed 9:00 am.

## 2013-11-23 NOTE — Telephone Encounter (Signed)
Message copied by Johny Shears on Thu Nov 23, 2013  9:49 AM ------      Message from: Coral Spikes      Created: Tue Nov 21, 2013  9:15 PM       Izora Gala you call him and tell him that I called in some potassium for him to take while he's on the lasix.  Also I called in albuterol, spiriva and a new inhaler Ruthe Mannan).  Please be sure that he knows to follow up this week.            Thanks [employer] ------

## 2013-11-23 NOTE — Progress Notes (Signed)
   Subjective:    Patient ID: FORREST JAROSZEWSKI, male    DOB: Oct 16, 1950, 63 y.o.   MRN: [id number]  HPI 63 year old male with COPD, PVD, Tobacco abuse, HTN, Alcohol abuse, and Diastolic CHF presents for follow up of SOB (recent visit 10/13).  1) SOB  Patient reports that his SOB is improved some following diuresis w/ lasix.  He has noticed significant improvement in LE edema.   He is still having significant SOB particularly with exertion.  No fever, chills.  No change in baseline productive cough per his report.   No reports of chest pain.  Review of Systems Per HPI    Objective:   Physical Exam Filed Vitals:   11/23/13 1108  BP: 168/70  Pulse: 98  Temp: 98.3 F (36.8 C)   Exam: General: chronically ill appearing male, sitting up in chair.  Cardiovascular: RRR. No murmurs, rubs, or gallops.  Respiratory: CTAB. No rhonchi, wheezing or rales noted. Normal work of breathing. Speaking in full sentences.  Extremities: 2+ pitting LE edema.     Assessment & Plan:  63 year old male COPD, PVD, Tobacco abuse, HTN, Alcohol abuse, and Diastolic CHF who presents for follow up of dyspnea.  See Problem List

## 2013-11-23 NOTE — Telephone Encounter (Signed)
Spoke with patient and informed him of below

## 2013-11-23 NOTE — Assessment & Plan Note (Signed)
Patient still with significant edema.  Will continue diuresis (BMP performed today and Creatinine and K normal). Follow up scheduled on Wed.

## 2013-11-23 NOTE — Assessment & Plan Note (Signed)
Clear to auscultation today. Dyspnea likely multifactorial (secondary to CHF and COPD). Given only mild improvement with diuresis, will treat empirically for COPD exacerbation.  Starting patient on Doxycycline and Prednisone.

## 2013-11-29 ENCOUNTER — Ambulatory Visit: Payer: Medicaid Other | Admitting: Family Medicine

## 2013-12-15 ENCOUNTER — Ambulatory Visit: Payer: Medicaid Other | Admitting: Family Medicine

## 2013-12-16 IMAGING — CT CT ABD-PELV W/ CM
2 of 6 series · 16 of 46 positions shown, 18 images · IV contrast (APPLIED)
Comparison: CT 10/09/2009

CLINICAL DATA: Left upper quadrant abdominal pain.

CT ABDOMEN AND PELVIS WITH CONTRAST
TECHNIQUE: Multidetector CT imaging of the abdomen and pelvis was
performed following the standard protocol during bolus
administration of intravenous contrast.
Contrast: 100mL OMNIPAQUE IOHEXOL 300 MG/ML  SOLN

[Series 2: abd/pelv with 5.0 b31f st · axial · 0.86mm/px · z∈[-532,-147]mm · 13 of 87 slices shown, 15 images]
[im 5/87  soft-tissue]
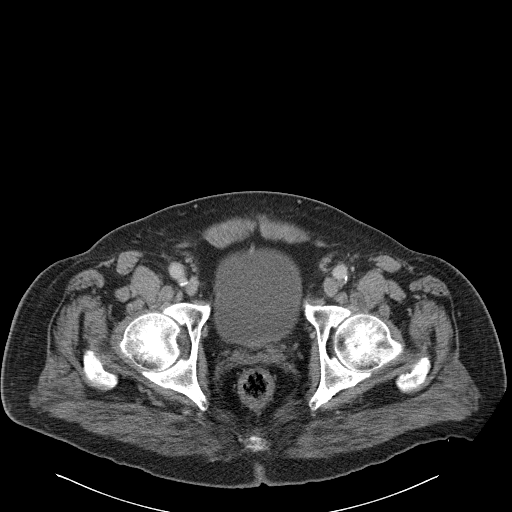
[im 5/87  bone]
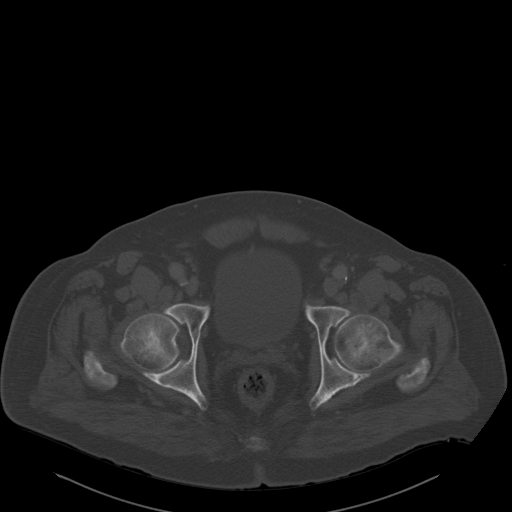
[im 10/87  soft-tissue]
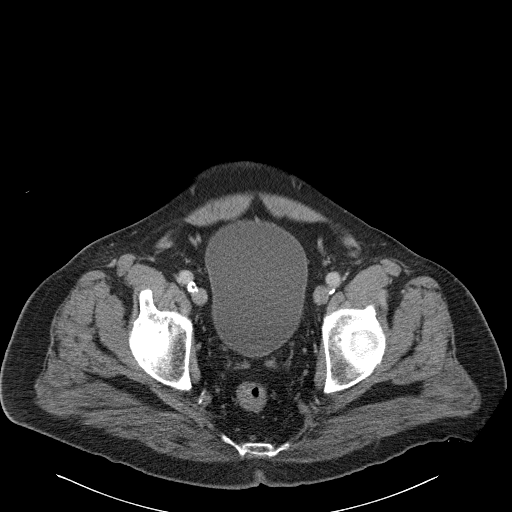
[im 20/87  soft-tissue]
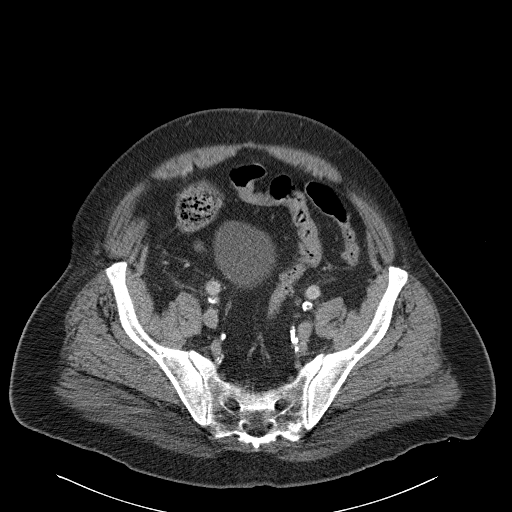
[im 24/87  soft-tissue]
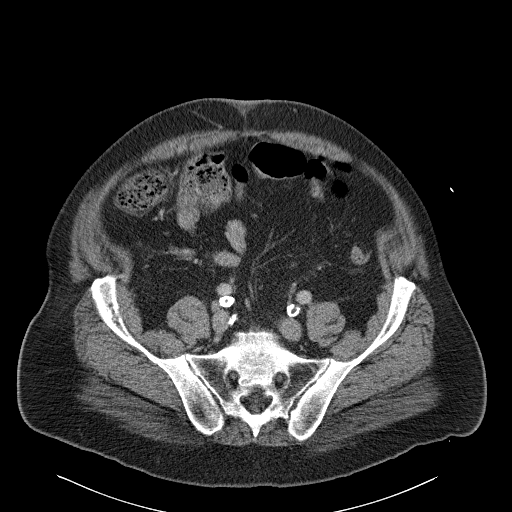
[im 29/87  soft-tissue]
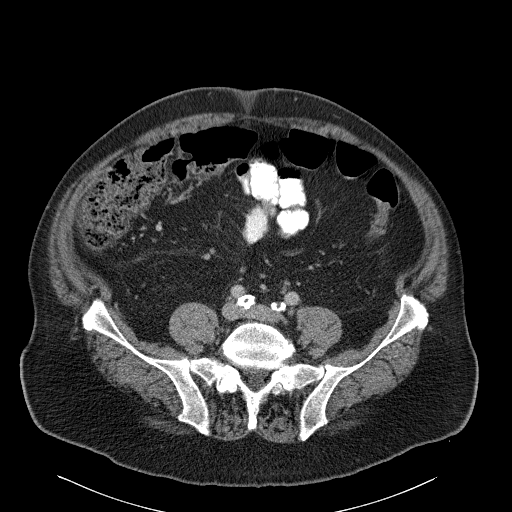
[im 39/87  soft-tissue]
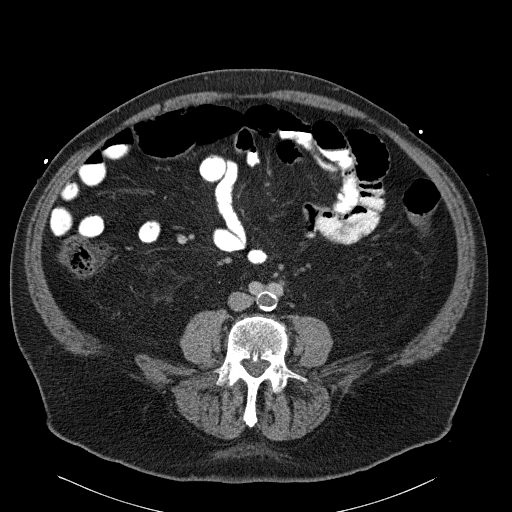
[im 44/87  soft-tissue]
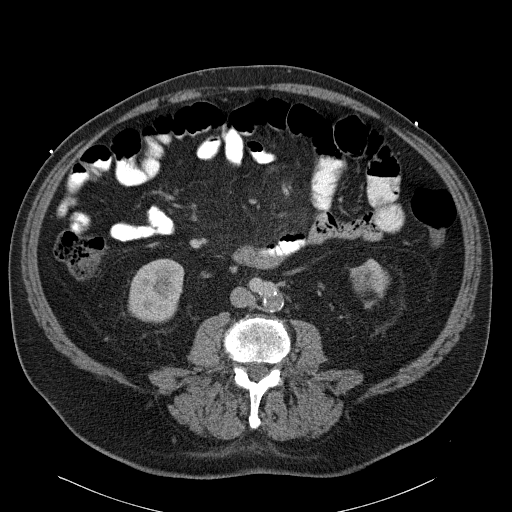
[im 48/87  soft-tissue]
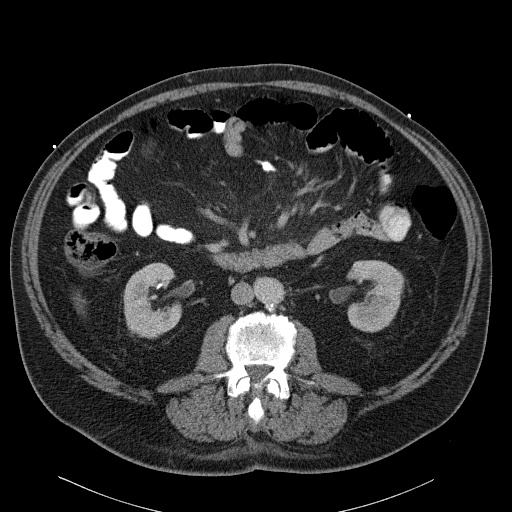
[im 58/87  soft-tissue]
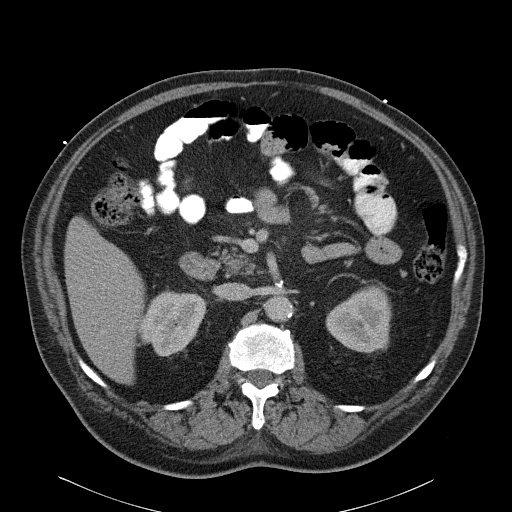
[im 58/87  bone]
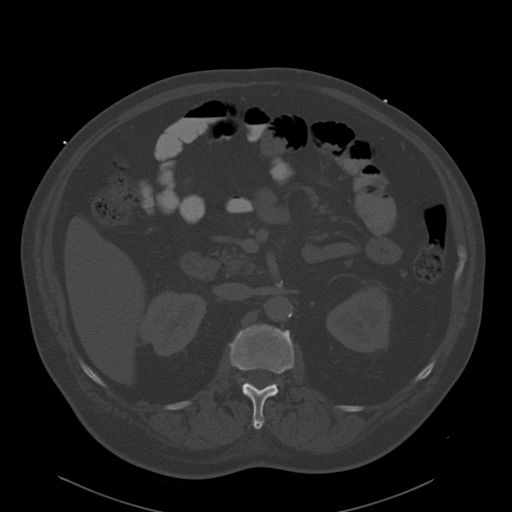
[im 63/87  soft-tissue]
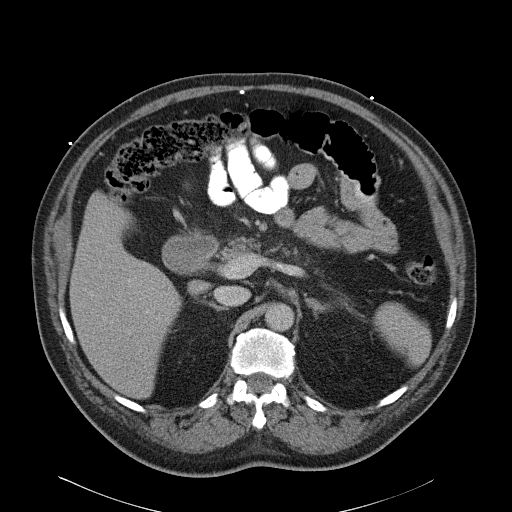
[im 67/87  soft-tissue]
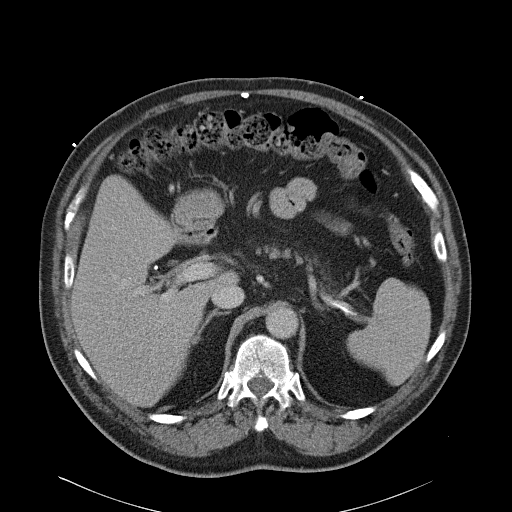
[im 77/87  soft-tissue]
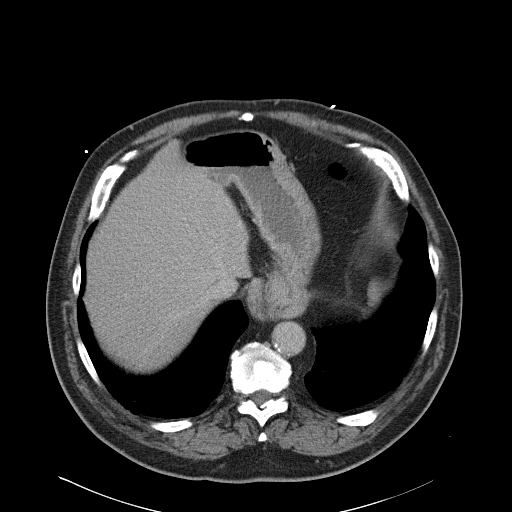
[im 82/87  soft-tissue]
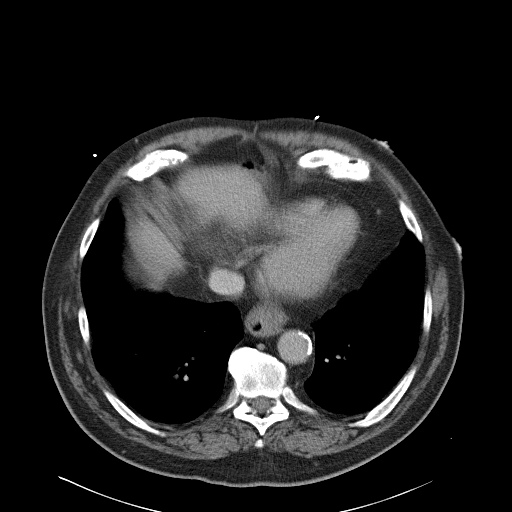

[Series 5: coronals · coronal · 0.81mm/px · 3 of 168 slices shown]
[im 56/168  soft-tissue]
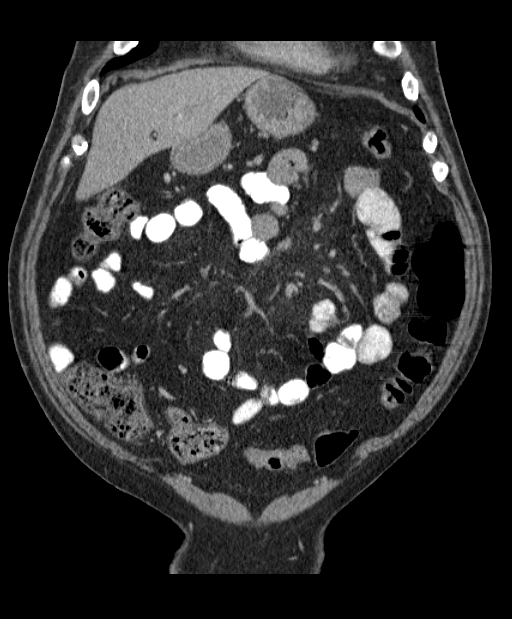
[im 75/168  soft-tissue]
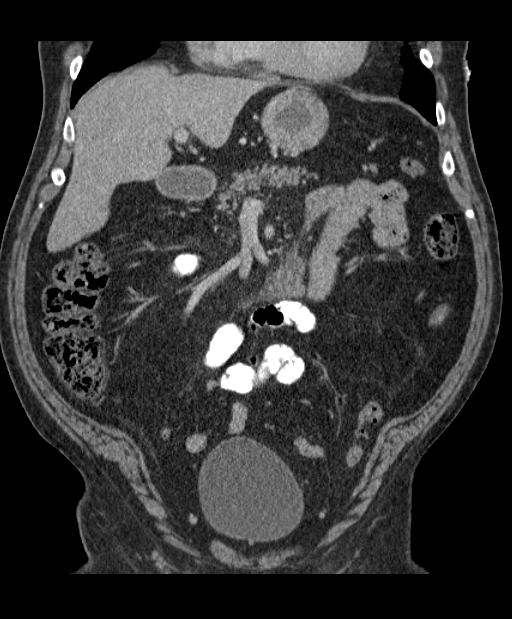
[im 93/168  soft-tissue]
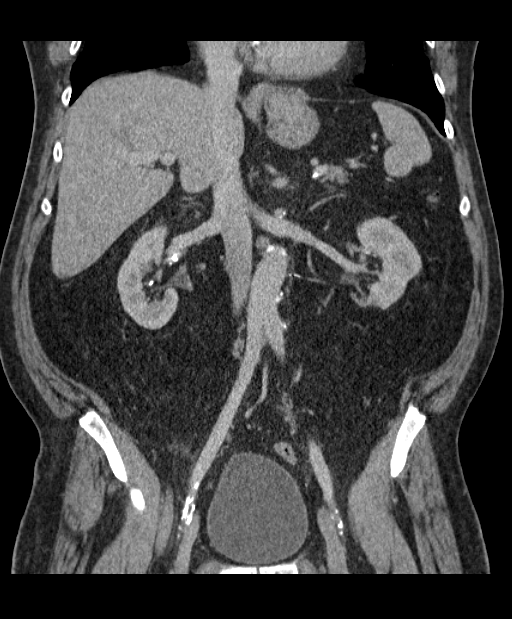

[16 of 46 positions shown; findings below may reference images not displayed]

FINDINGS: Motion artifact at the lung bases.  No evidence for free
intraperitoneal air.

Surgical clips at the GE junction are consistent with previous
fundoplication.  No gross abnormality to the liver and the
gallbladder has been removed.  Portal venous system is patent.
Again noted is mild stranding in the central small bowel mesentery
which is unchanged.  No gross abnormality to the pancreas, spleen
or adrenal glands.  There is mild fullness and nodularity in the
left adrenal gland which is unchanged.  No hydronephrosis. Stable
scarring along the left kidney lower pole.

The patient has undergone an aortobifemoral bypass graft.  The
bypass limbs appear to be patent. Limited evaluation of the
femoral/femoral bypass graft.  Degenerative disc changes at L4-L5
and L5-S1.  There is a lymph node between the aorta and inferior
vena cava on sequence two, image 34 which measures 1.3 x 1.1 cm and
previously measured 1.2 x 1.1 cm.  Overall, there is no significant
abdominal or pelvic lymphadenopathy.

Few calcifications within the prostate gland.  There is fluid in
the urinary bladder.  No acute bony abnormality.  There may be old
left rib fractures.
IMPRESSION: No acute abnormalities within the abdomen or pelvis.

Postsurgical changes consistent with an aortobifemoral bypass graft
and a fundoplication.

## 2013-12-16 IMAGING — CR DG CHEST 2V
1 series · 1 of 1 positions shown · non-contrast
Comparison: January 04, 2012.

CLINICAL DATA: Chest pain.

CHEST - 2 VIEW

[view not recorded]
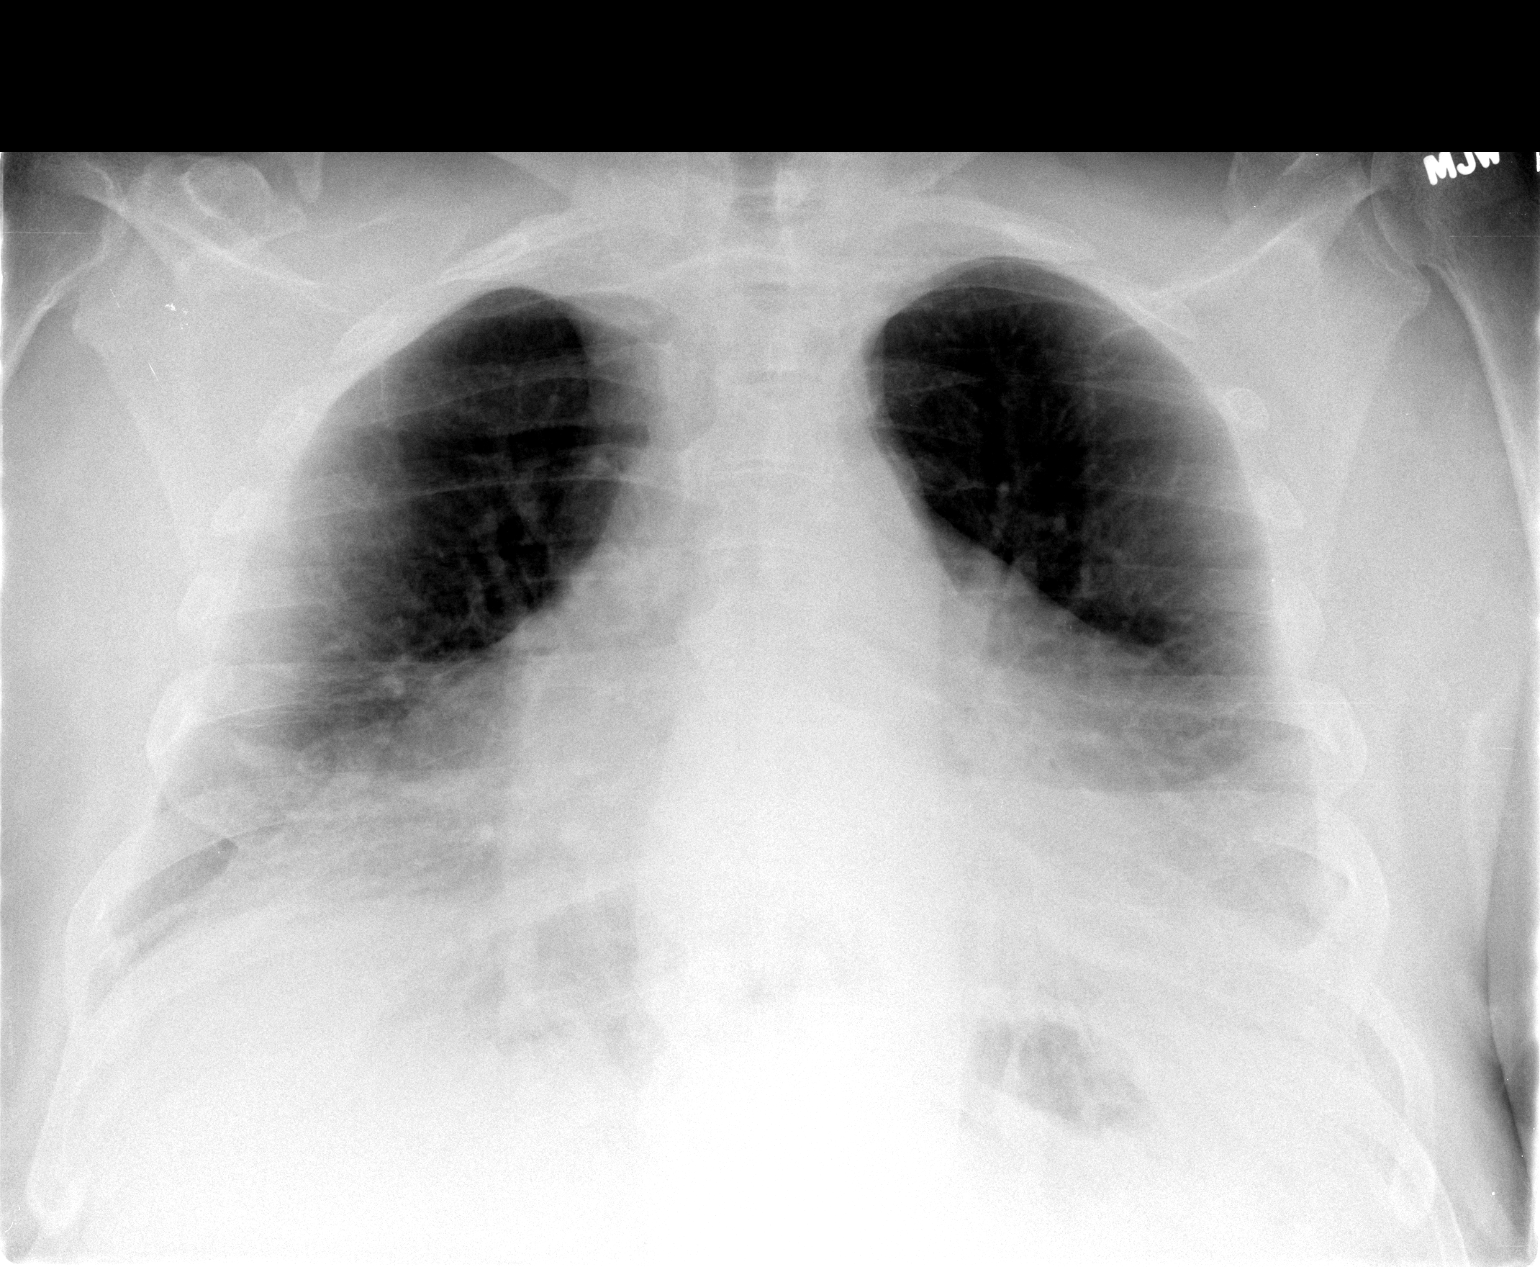

[1 of 1 positions shown; findings below may reference images not displayed]

FINDINGS: Stable cardiomegaly.  Bony thorax is intact.  No acute
pulmonary disease is noted.
IMPRESSION: No acute cardiopulmonary abnormality seen.

## 2014-01-06 IMAGING — CT CT ANGIO CHEST
2 of 6 series · 19 of 46 positions shown · IV contrast (APPLIED)
Comparison: Chest radiographs dated 04/04/2012

CLINICAL DATA: Chest pain, hemoptysis, evaluate for PE

CT ANGIOGRAPHY CHEST
TECHNIQUE: Multidetector CT imaging of the chest using the
standard protocol during bolus administration of intravenous
contrast. Multiplanar reconstructed images including MIPs were
obtained and reviewed to evaluate the vascular anatomy.
Contrast: 100mL OMNIPAQUE IOHEXOL 350 MG/ML SOLN

[Series 6: pulm embolism 1.0 b25f thin · axial · 0.75mm/px · z∈[-296,-30]mm · 16 of 292 slices shown]
[im 13/292  lung]
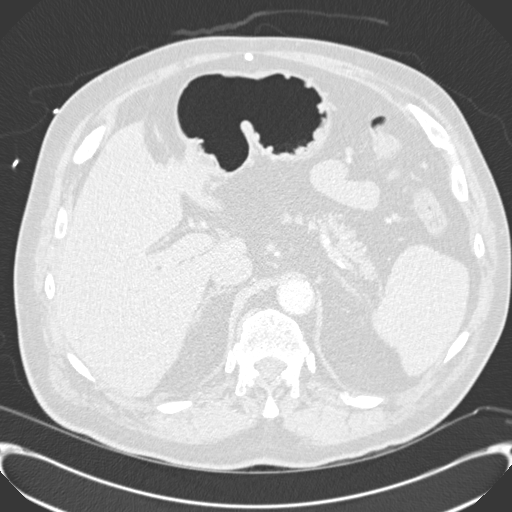
[im 38/292  soft-tissue]
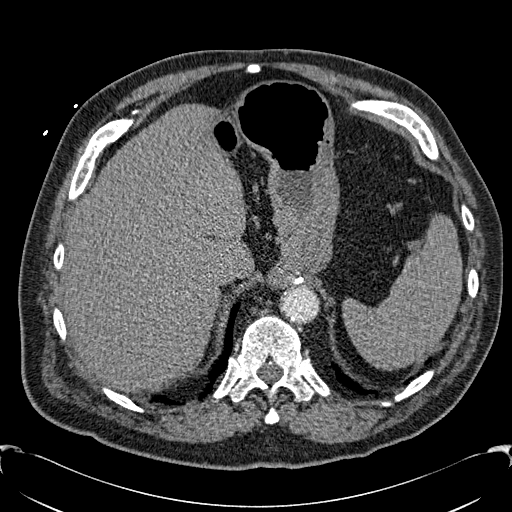
[im 51/292  lung]
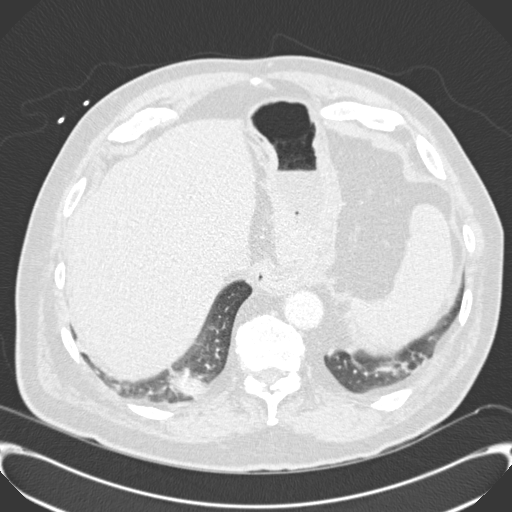
[im 64/292  soft-tissue]
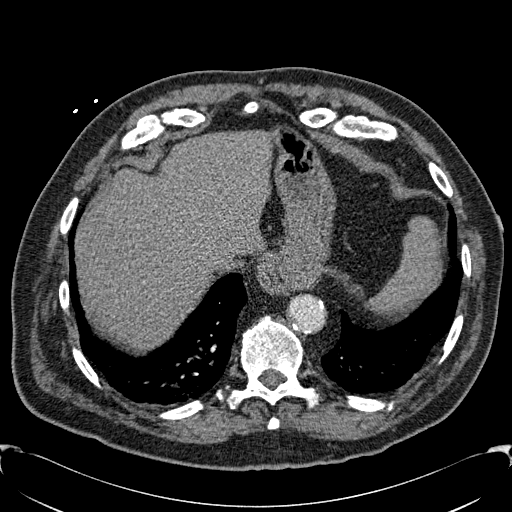
[im 89/292  lung]
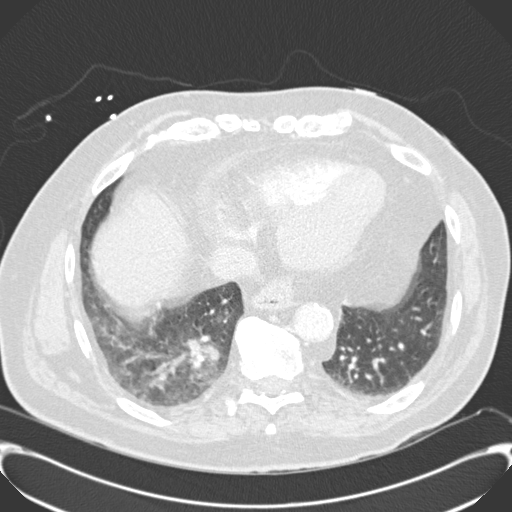
[im 102/292  soft-tissue]
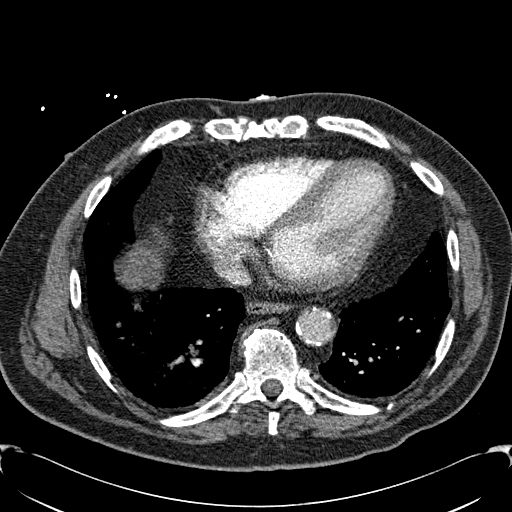
[im 114/292  lung]
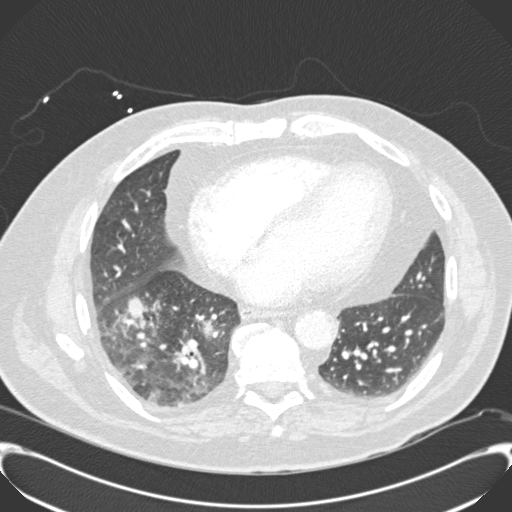
[im 140/292  soft-tissue]
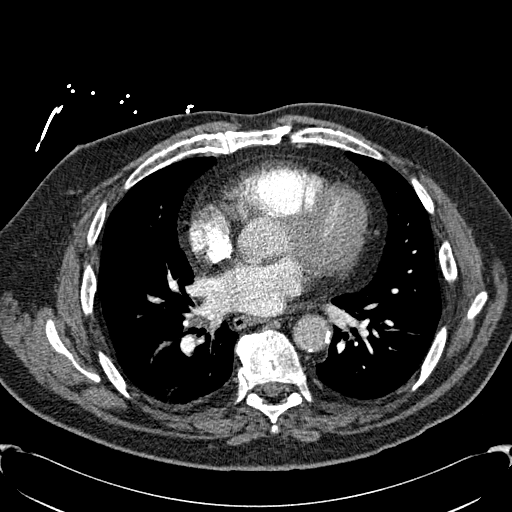
[im 152/292  lung]
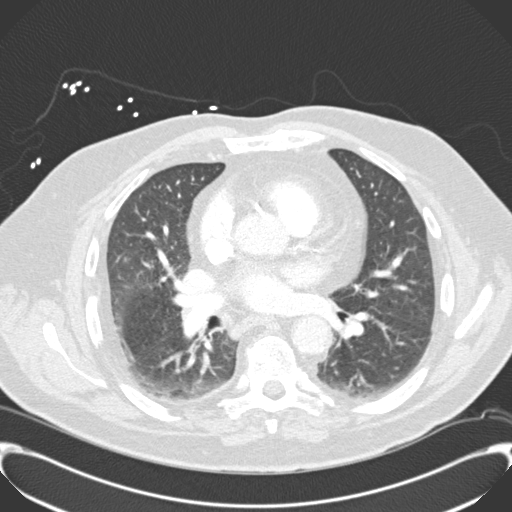
[im 178/292  soft-tissue]
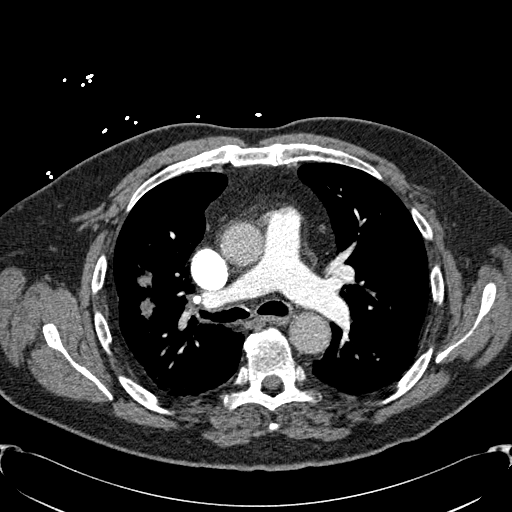
[im 190/292  lung]
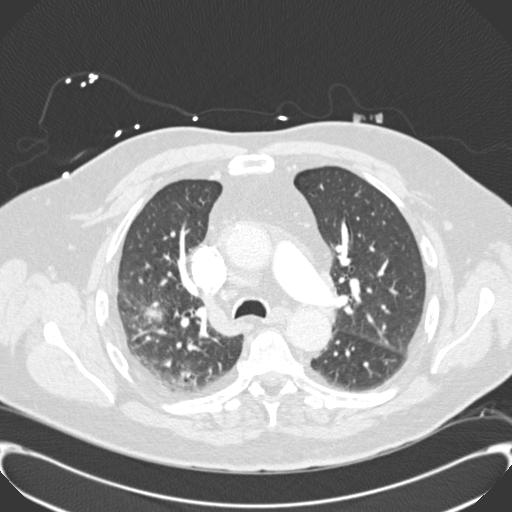
[im 203/292  soft-tissue]
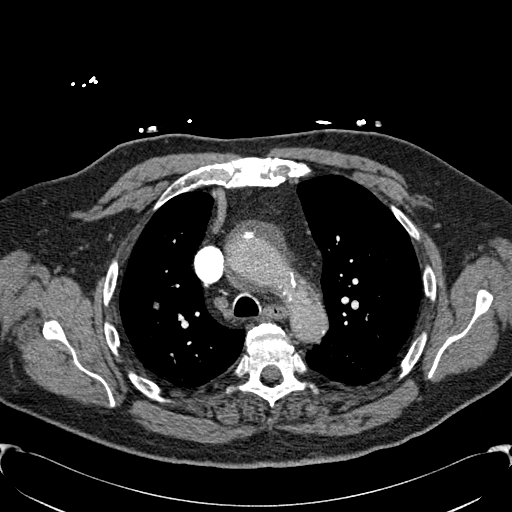
[im 228/292  lung]
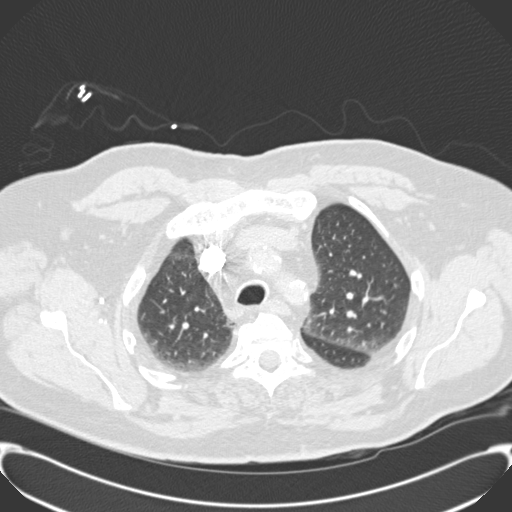
[im 241/292  soft-tissue]
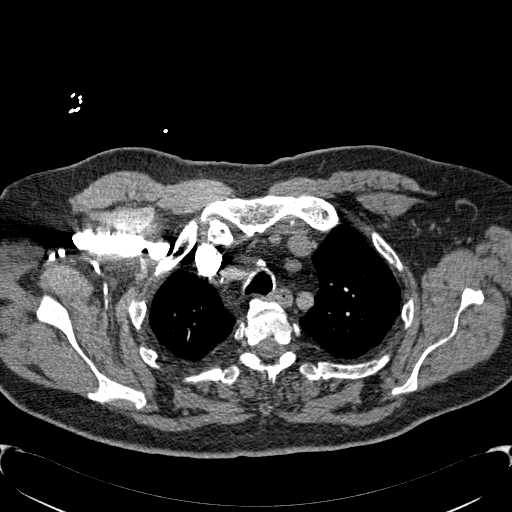
[im 254/292  lung]
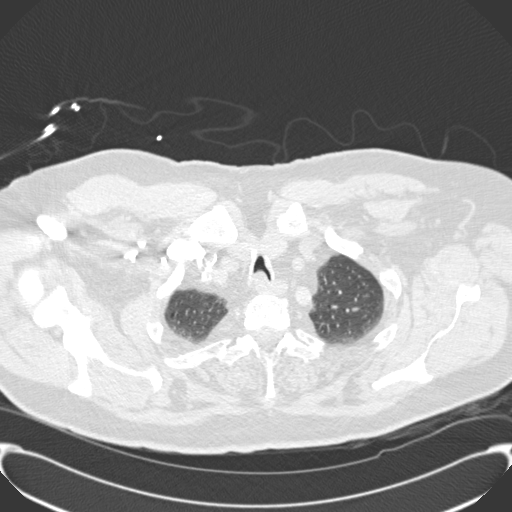
[im 279/292  soft-tissue]
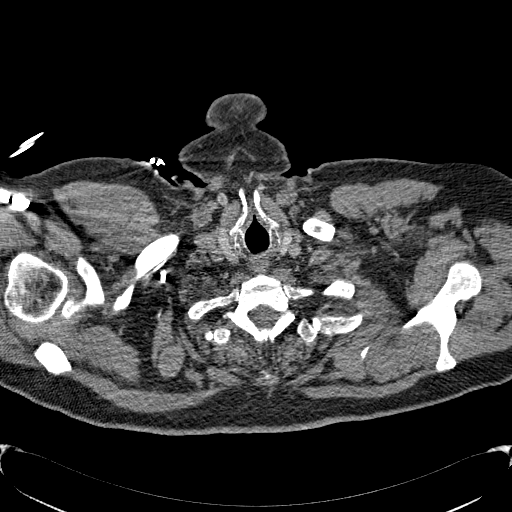

[Series 8: coronals · coronal · 0.70mm/px · 3 of 119 slices shown]
[im 30/119  soft-tissue]
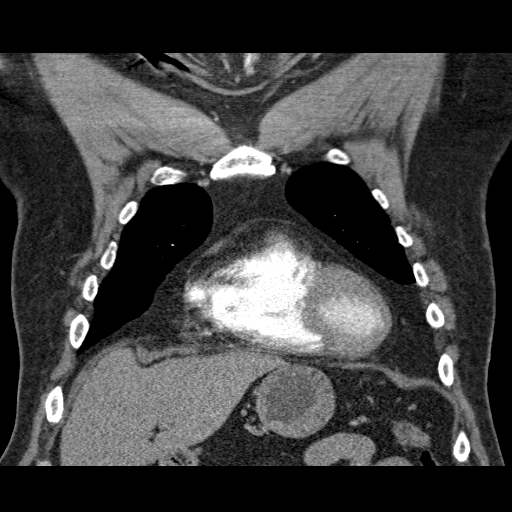
[im 60/119  soft-tissue]
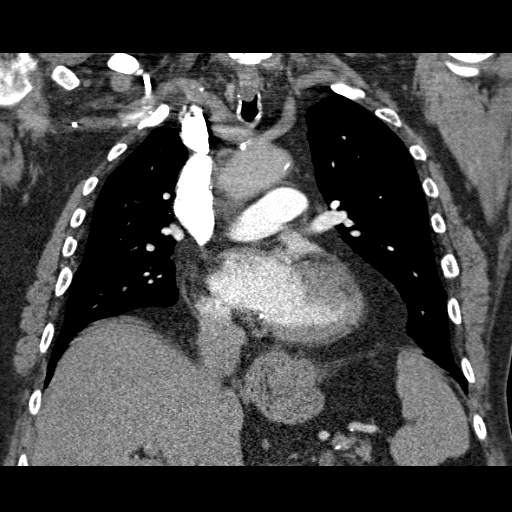
[im 89/119  soft-tissue]
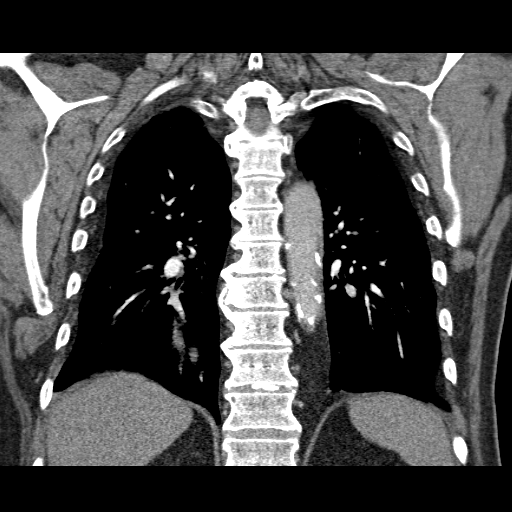

[19 of 46 positions shown; findings below may reference images not displayed]

FINDINGS: No evidence of pulmonary embolism.

Multifocal patchy/nodular opacities in the right upper and lower
lobes, suspicious for pneumonia.  Mild dependent atelectasis at the
lung bases.  Trace right pleural fluid.

Visualized thyroid is unremarkable.

The heart is top normal in size.  No pericardial effusion.
Coronary atherosclerosis.  Atherosclerotic calcifications of the
aortic arch.

Fluid along the ascending aorta likely reflects the normal superior
pericardial recess.

Narrowing of the right proximal brachiocephalic artery with
associated calcified atherosclerotic plaque (series 4/image 19).

Mediastinal lymph nodes measuring up to mm short axis.  No
suspicious hilar or axillary lymphadenopathy.

Postsurgical changes at the GE junction with small hiatal hernia.
Cholecystectomy clips.

Degenerative changes of the visualized thoracolumbar spine.
IMPRESSION: No evidence of pulmonary embolism.

Multifocal patchy/nodular opacities in the right upper and lower
lobes, suspicious for multifocal pneumonia.

Atherosclerosis with narrowing of the right proximal
brachiocephalic artery.

## 2014-01-06 IMAGING — CR DG CHEST 2V
2 series · 2 of 2 positions shown · non-contrast
Comparison: 03/14/2012

CLINICAL DATA: Chest pain

CHEST - 2 VIEW

[w chest pa]
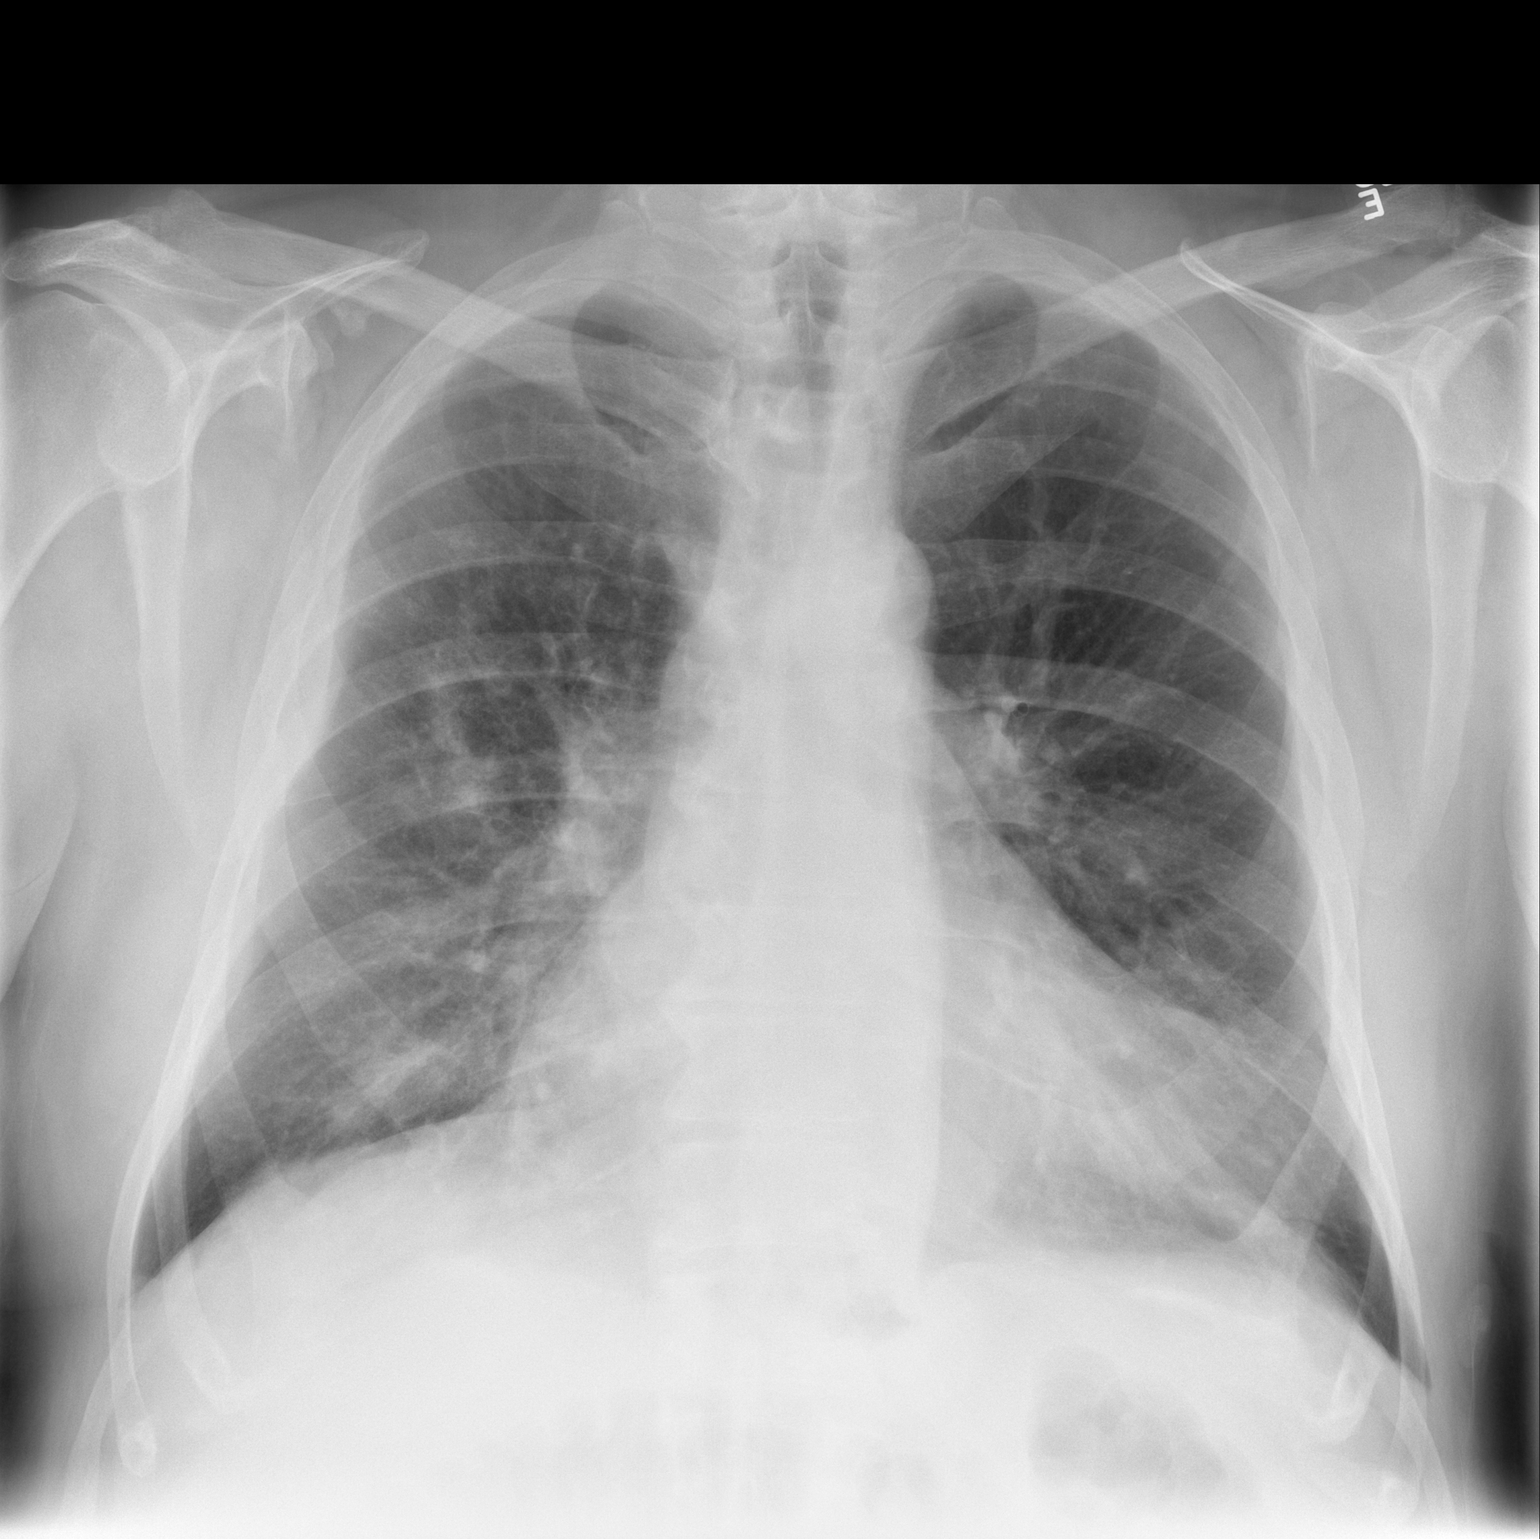

[w chest lat]
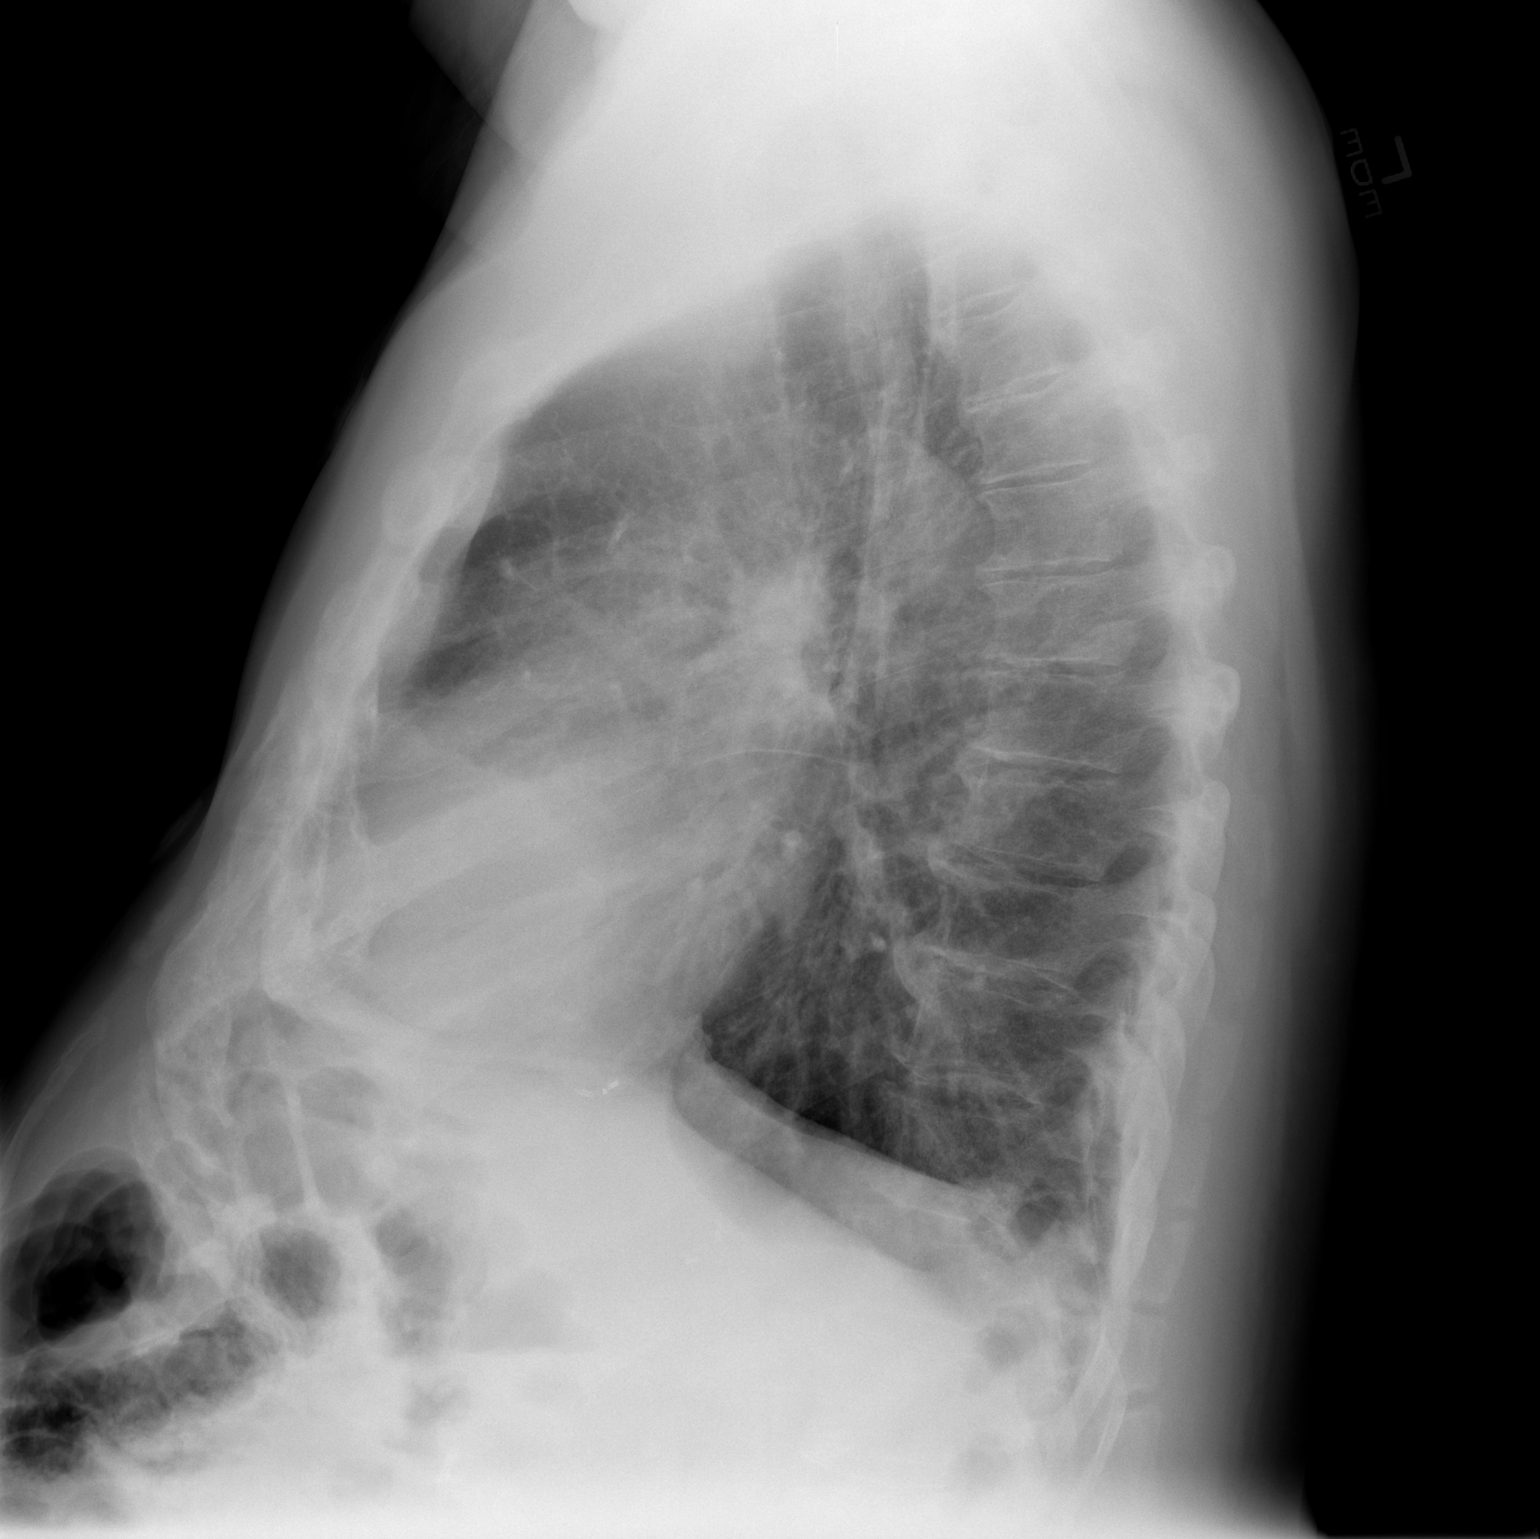

[2 of 2 positions shown; findings below may reference images not displayed]

FINDINGS: Cardiomegaly.  Patchy right mid and lower lung airspace
opacity concerning for possible pneumonia.  No confluent opacity on
the left.  No effusions.  Mild hyperinflation.  No acute bony
abnormality in
IMPRESSION: Patchy right perihilar and lower lobe opacities concerning for
pneumonia.

Cardiomegaly, hyperinflation.

## 2014-01-23 IMAGING — CR DG CHEST 2V
2 series · 2 of 2 positions shown · non-contrast
Comparison: Chest x-ray of 04/04/2012 and CT angio chest of the
same date

CLINICAL DATA: Recent pneumonia, cough, congestion

CHEST - 2 VIEW

[w chest pa]
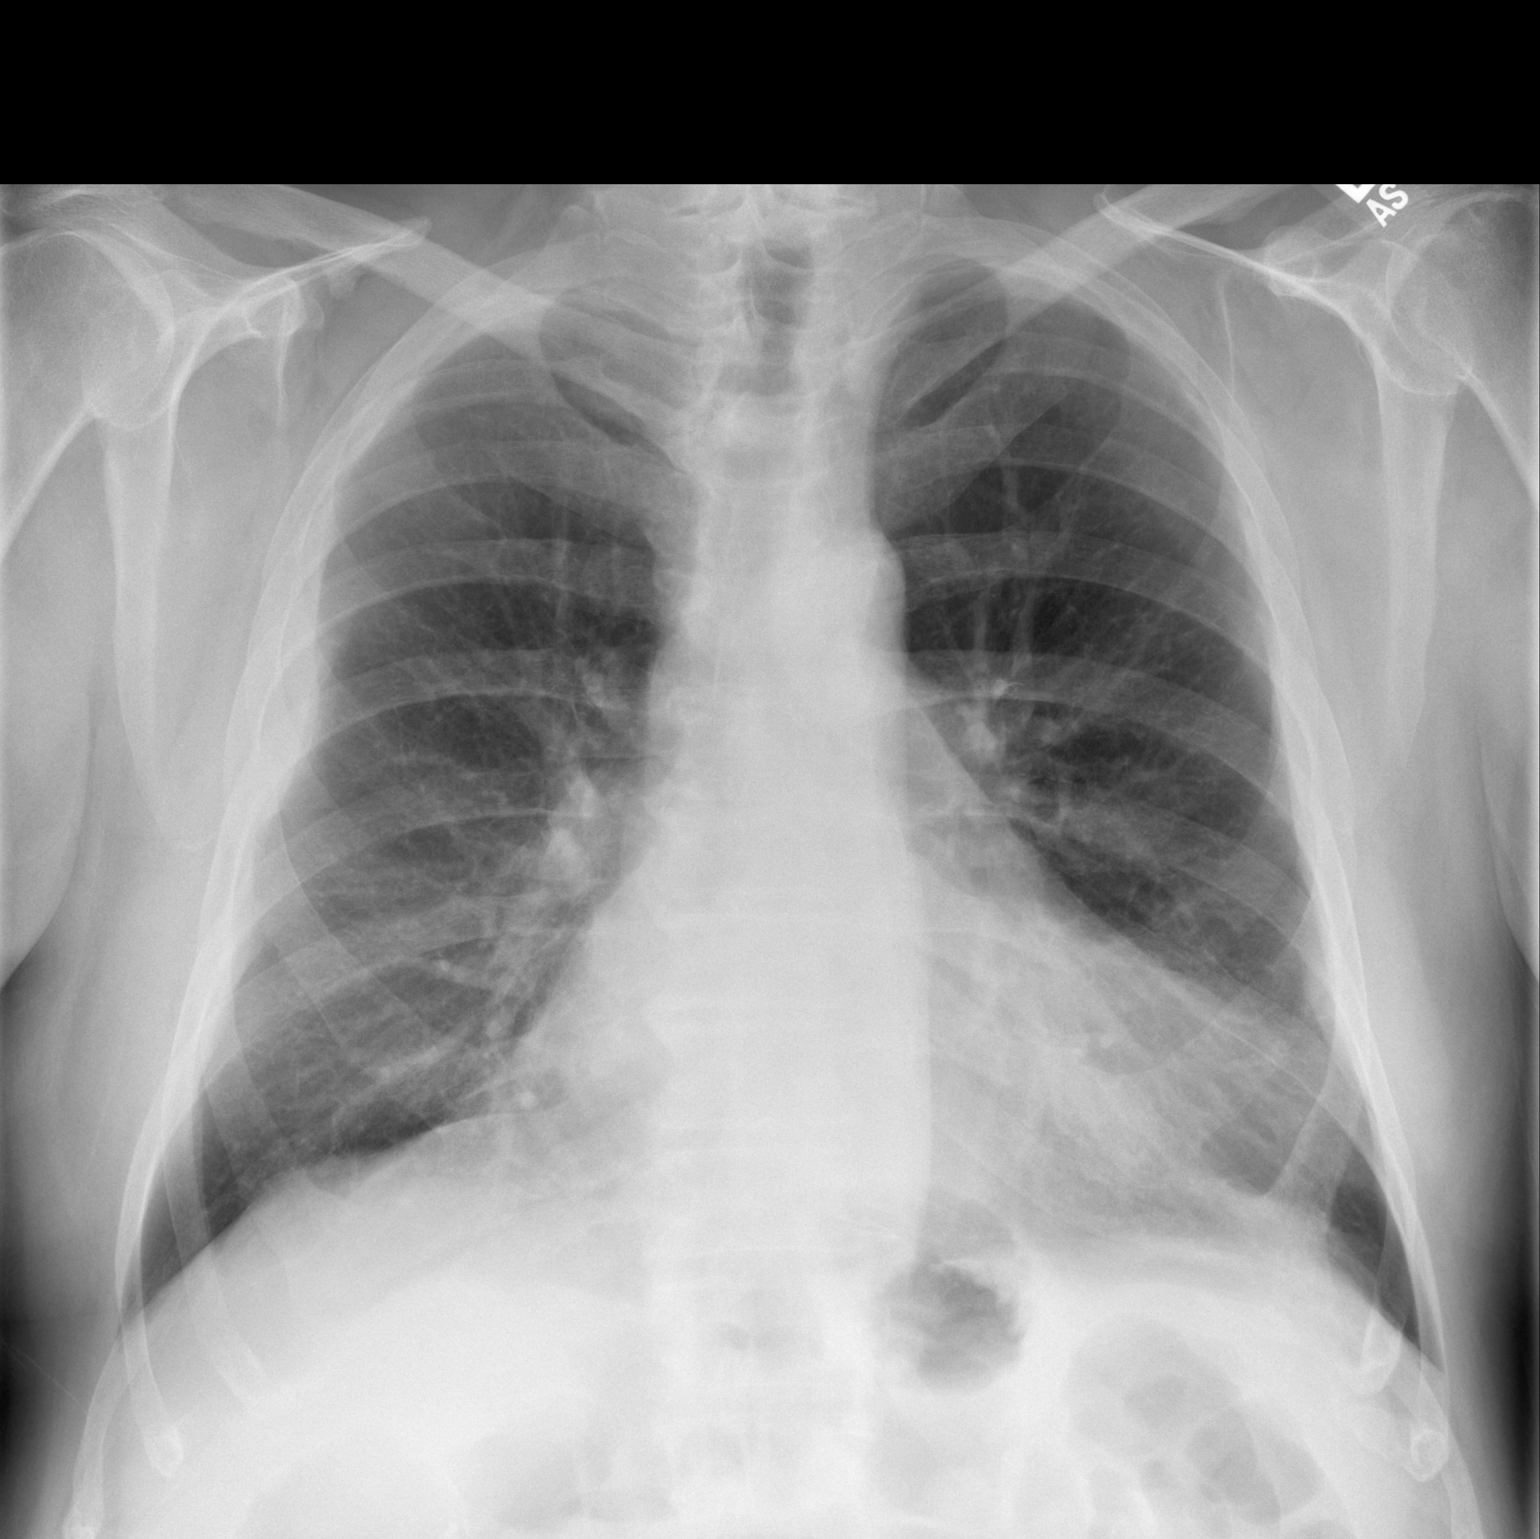

[w chest lat]
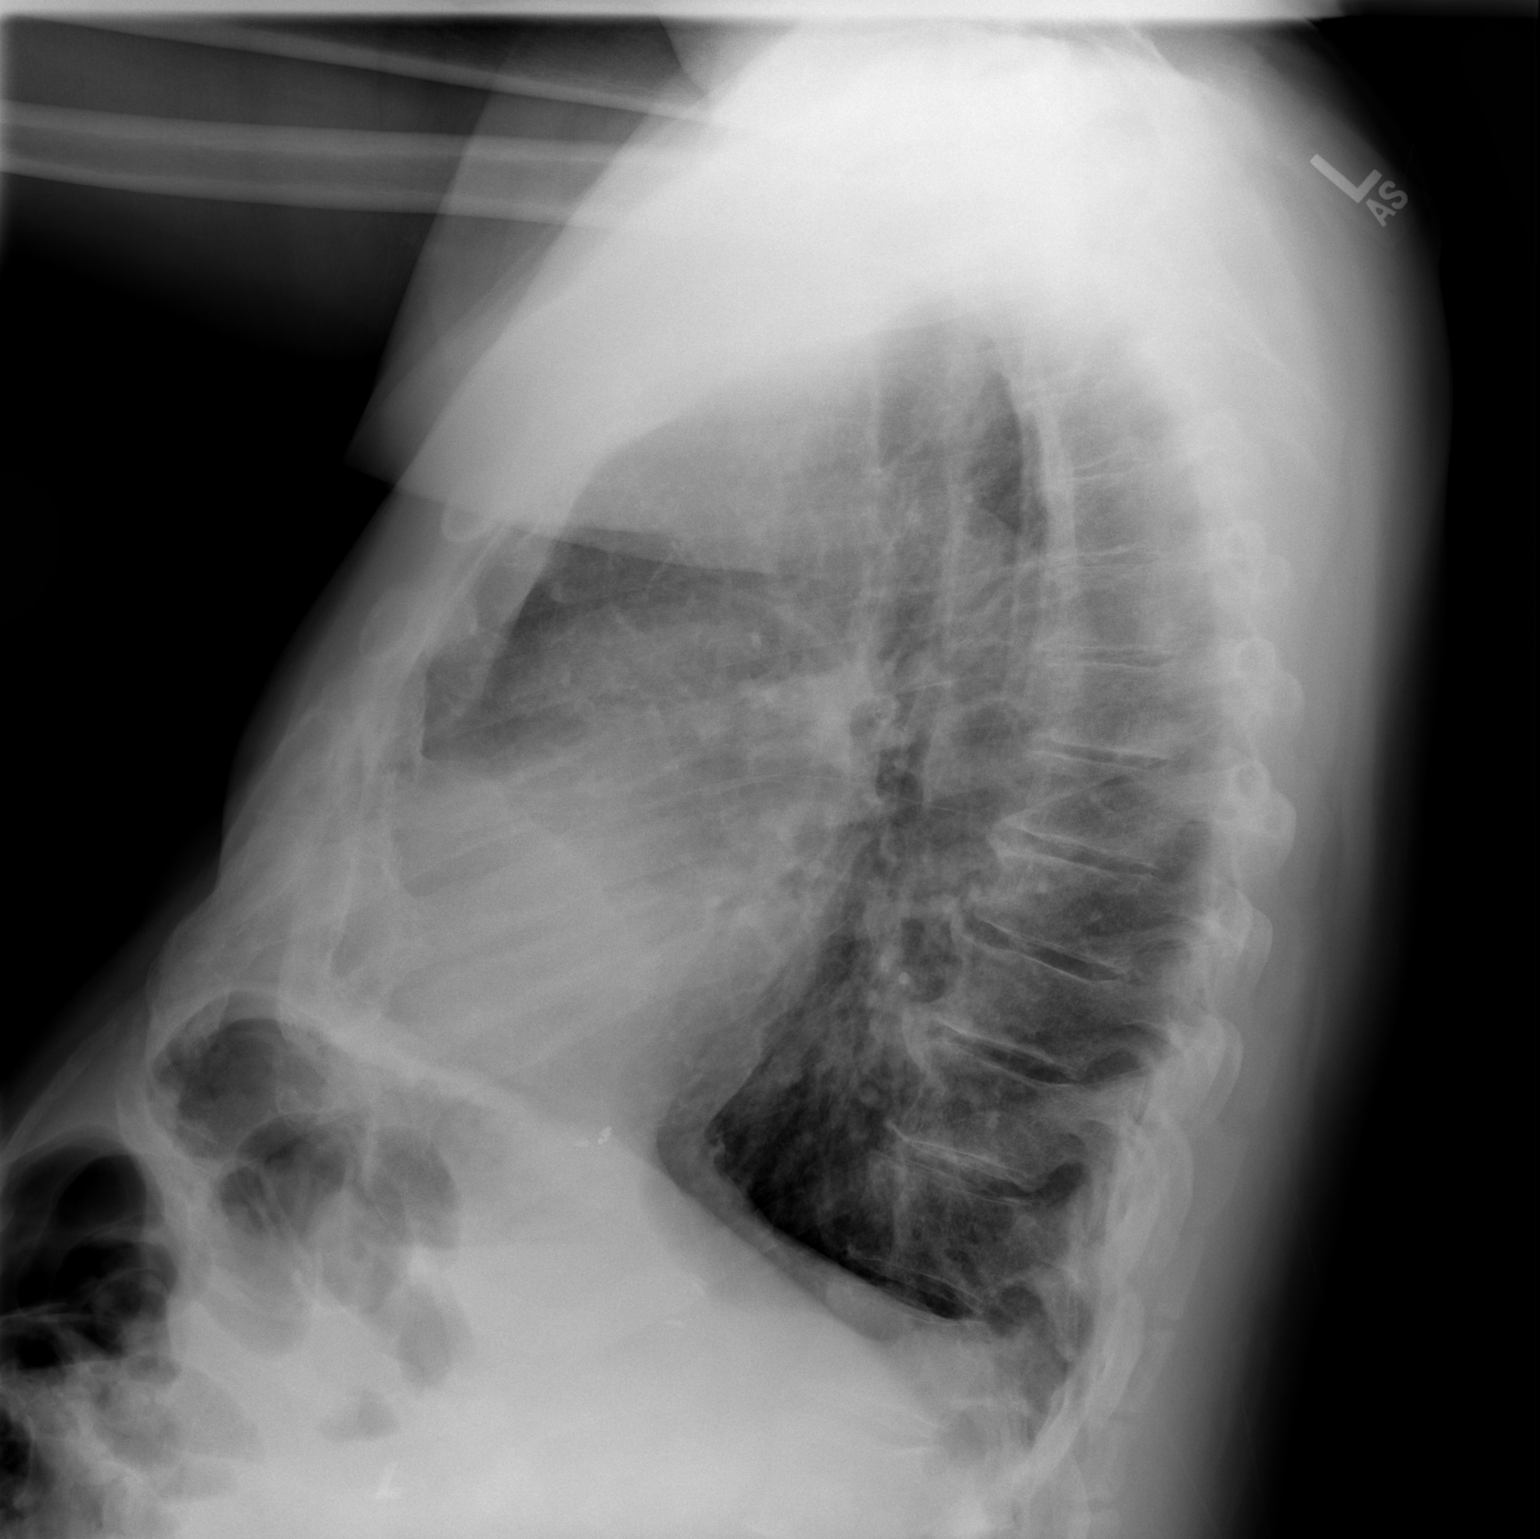

[2 of 2 positions shown; findings below may reference images not displayed]

FINDINGS: The patchy opacities in the right mid and lower lung
field have cleared.  Pleural thickening along the lateral right
hemithorax appears stable.  No effusion is seen.  Cardiomegaly is
stable.  There are degenerative changes throughout the thoracic
spine.
IMPRESSION: Clearing of right mid and lower lung field infiltrates.  No active
process.  Stable cardiomegaly.

## 2014-02-21 ENCOUNTER — Encounter: Payer: Self-pay | Admitting: Family Medicine

## 2014-02-21 ENCOUNTER — Ambulatory Visit (INDEPENDENT_AMBULATORY_CARE_PROVIDER_SITE_OTHER): Payer: Medicaid Other | Admitting: Family Medicine

## 2014-02-21 VITALS — BP 151/90 | HR 109 | Temp 97.9°F | Ht 72.0 in | Wt 244.7 lb

## 2014-02-21 DIAGNOSIS — M10079 Idiopathic gout, unspecified ankle and foot: Secondary | ICD-10-CM

## 2014-02-21 DIAGNOSIS — I10 Essential (primary) hypertension: Secondary | ICD-10-CM

## 2014-02-21 DIAGNOSIS — K219 Gastro-esophageal reflux disease without esophagitis: Secondary | ICD-10-CM

## 2014-02-21 DIAGNOSIS — J449 Chronic obstructive pulmonary disease, unspecified: Secondary | ICD-10-CM

## 2014-02-21 DIAGNOSIS — R600 Localized edema: Secondary | ICD-10-CM | POA: Insufficient documentation

## 2014-02-21 DIAGNOSIS — Z720 Tobacco use: Secondary | ICD-10-CM

## 2014-02-21 DIAGNOSIS — I5033 Acute on chronic diastolic (congestive) heart failure: Secondary | ICD-10-CM

## 2014-02-21 DIAGNOSIS — I739 Peripheral vascular disease, unspecified: Secondary | ICD-10-CM

## 2014-02-21 HISTORY — DX: Localized edema: R60.0

## 2014-02-21 LAB — CBC WITH DIFFERENTIAL/PLATELET
BASOS PCT: 1 % (ref 0–1)
Basophils Absolute: 0.1 10*3/uL (ref 0.0–0.1)
EOS ABS: 0.2 10*3/uL (ref 0.0–0.7)
EOS PCT: 2 % (ref 0–5)
HEMATOCRIT: 50.9 % (ref 39.0–52.0)
Hemoglobin: 17.5 g/dL — ABNORMAL HIGH (ref 13.0–17.0)
LYMPHS ABS: 2.1 10*3/uL (ref 0.7–4.0)
Lymphocytes Relative: 28 % (ref 12–46)
MCH: 31.4 pg (ref 26.0–34.0)
MCHC: 34.4 g/dL (ref 30.0–36.0)
MCV: 91.2 fL (ref 78.0–100.0)
MONO ABS: 1.1 10*3/uL — AB (ref 0.1–1.0)
MPV: 11.6 fL (ref 8.6–12.4)
Monocytes Relative: 14 % — ABNORMAL HIGH (ref 3–12)
NEUTROS ABS: 4.1 10*3/uL (ref 1.7–7.7)
NEUTROS PCT: 55 % (ref 43–77)
Platelets: 205 10*3/uL (ref 150–400)
RBC: 5.58 MIL/uL (ref 4.22–5.81)
RDW: 14.5 % (ref 11.5–15.5)
WBC: 7.5 10*3/uL (ref 4.0–10.5)

## 2014-02-21 MED ORDER — FUROSEMIDE 40 MG PO TABS: 40.0000 mg | ORAL_TABLET | Freq: Every day | ORAL | Status: DC

## 2014-02-21 MED ORDER — CLOTRIMAZOLE-BETAMETHASONE 1-0.05 % EX CREA: 1.0000 "application " | TOPICAL_CREAM | Freq: Two times a day (BID) | CUTANEOUS | Status: DC

## 2014-02-21 MED ORDER — OMEPRAZOLE 20 MG PO CPDR: 20.0000 mg | DELAYED_RELEASE_CAPSULE | Freq: Every day | ORAL | Status: DC

## 2014-02-21 NOTE — Assessment & Plan Note (Signed)
Stopped allopurinol without refill for several months now without problem He is confused about his meds so will dc for now given polypharmacy

## 2014-02-21 NOTE — Assessment & Plan Note (Signed)
10 units smoking, very confused about his medications Breathing easy today Follow-up in pharmacy clinic for polypharmacy and help reconciling/teaching about medications

## 2014-02-21 NOTE — Assessment & Plan Note (Signed)
Factorial, some contribution likely from PVD, also from congestive heart failure With continued pain after swelling resolved and tender area on his posterior calf will rule out DVT with venous duplex Ask him to follow-up with vascular surgery and continue Lasix

## 2014-02-21 NOTE — Patient Instructions (Addendum)
Please see Dr. Valentina Lucks to go through your medicines  We will set up  An ultrasound for your leg  PLease make an appointment with Dr. Valentina Lucks to go through your medicines, please bring your meds to that appt Please come back to see me in 1 month  PLease see Dr. Scot Dock again, his phone number is [phone]

## 2014-02-21 NOTE — Assessment & Plan Note (Signed)
Known severe peripheral vascular disease now with recurrent edema Edema likely due to several factors including PVD, diastolic CHF, venous insufficiency, history of DVT Venous duplex today to rule out DVT Encouraged follow-up with vascular surgery No signs of ischemic limb today

## 2014-02-21 NOTE — Progress Notes (Signed)
Patient ID: [name], male   DOB: August 21, 1950, 64 y.o.   MRN: [id number]   HPI  Patient presents today for follow-up and lower extremity swelling  Lower stream he swelling Patient states that for the last few weeks he had bilateral lower extremity swelling. He also had right lower extremity pain which is sharp and shooting originating in his foot/ankle and radiating up to his knee. He does have a history of DVT. He did not have any Lasix so he took 40 mg pills that his friend gave him with very good improvement with his swelling. He denies dyspnea, chest pain  Smoking Tried nortriptyline and nicotine lozenges with no improvement, still smoking 1 pack per day  Polypharmacy When reviewing medications he is very confused and can't tell me which medications he is taking. He doesn't understand his inhalers.  COPD Continues to smoke, states that his breathing is easy today.  Patient states her last  Smoking status noted ROS: Per HPI  Objective: BP 151/90 mmHg  Pulse 109  Temp(Src) 97.9 F (36.6 C) (Oral)  Ht 6' (1.829 m)  Wt 244 lb 11.2 oz (110.995 kg)  BMI 33.18 kg/m2 Gen: NAD, alert, cooperative with exam HEENT: NCAT CV: RRR, good S1/S2, no murmur Resp: CTABL, no wheezes, non-labored Abd: SNTND, BS present, no guarding or organomegaly Ext: Right lower extremity erythematous compared to left lower extremity, no edema bilaterally, normal warmth to the touch bilaterally, tenderness of the right calf, bilateral calves measure 39 Cm in Circumference Neuro: Alert and oriented, No gross deficits  Assessment and plan:  Gout Stopped allopurinol without refill for several months now without problem He is confused about his meds so will dc for now given polypharmacy   GERD (gastroesophageal reflux disease) Refill omeprazole   Lower extremity edema Factorial, some contribution likely from PVD, also from congestive heart failure With continued pain after swelling resolved  and tender area on his posterior calf will rule out DVT with venous duplex Ask him to follow-up with vascular surgery and continue Lasix   Tobacco abuse Dose trying to quit, encourage Did not find much help with nortriptyline and nicotine lozenge Difficult given his confusion about his medications at this point, also severe nicotine dependence, continue to follow-up with me and pharmacy clinic   Peripheral vascular disease Known severe peripheral vascular disease now with recurrent edema Edema likely due to several factors including PVD, diastolic CHF, venous insufficiency, history of DVT Venous duplex today to rule out DVT Encouraged follow-up with vascular surgery No signs of ischemic limb today    Diastolic CHF, acute on chronic Appears euvolemic Only grade 1 diastolic dysfunction on echo, however has had dyspnea and swelling associated in the previous exacerbation Continue Lasix, he got this from his friend that I think it is appropriate given his symptoms Check renal function today, follow-up next month    COPD (chronic obstructive pulmonary disease) 10 units smoking, very confused about his medications Breathing easy today Follow-up in pharmacy clinic for polypharmacy and help reconciling/teaching about medications    Orders Placed This Encounter  Procedures  . CBC with Differential  . Comprehensive metabolic panel  . Lower Extremity Venous Duplex Right    Standing Status: Future     Number of Occurrences:      Standing Expiration Date: 02/22/2015    Order Specific Question:  Laterality    Answer:  Right    Order Specific Question:  Where should this test be performed:    Answer:  [place]    Meds ordered this encounter  Medications  . omeprazole (PRILOSEC) 20 MG capsule    Sig: Take 1 capsule (20 mg total) by mouth daily.    Dispense:  30 capsule    Refill:  11  . furosemide (LASIX) 40 MG tablet    Sig: Take 1 tablet (40 mg total) by mouth daily.     Dispense:  30 tablet    Refill:  3  . clotrimazole-betamethasone (LOTRISONE) cream    Sig: Apply 1 application topically 2 (two) times daily.    Dispense:  30 g    Refill:  0

## 2014-02-21 NOTE — Assessment & Plan Note (Signed)
Dose trying to quit, encourage Did not find much help with nortriptyline and nicotine lozenge Difficult given his confusion about his medications at this point, also severe nicotine dependence, continue to follow-up with me and pharmacy clinic

## 2014-02-21 NOTE — Assessment & Plan Note (Signed)
Appears euvolemic Only grade 1 diastolic dysfunction on echo, however has had dyspnea and swelling associated in the previous exacerbation Continue Lasix, he got this from his friend that I think it is appropriate given his symptoms Check renal function today, follow-up next month

## 2014-02-21 NOTE — Assessment & Plan Note (Signed)
Refill omeprazole

## 2014-02-22 ENCOUNTER — Ambulatory Visit: Payer: Medicaid Other | Admitting: Family Medicine

## 2014-02-22 ENCOUNTER — Ambulatory Visit (HOSPITAL_COMMUNITY)
Admission: RE | Admit: 2014-02-22 | Discharge: 2014-02-22 | Disposition: A | Discharge status: Home / Self Care | Payer: Medicaid Other | Source: Ambulatory Visit | Attending: Family Medicine | Admitting: Family Medicine

## 2014-02-22 ENCOUNTER — Telehealth: Payer: Self-pay | Admitting: Family Medicine

## 2014-02-22 DIAGNOSIS — I82431 Acute embolism and thrombosis of right popliteal vein: Secondary | ICD-10-CM | POA: Insufficient documentation

## 2014-02-22 DIAGNOSIS — I82409 Acute embolism and thrombosis of unspecified deep veins of unspecified lower extremity: Secondary | ICD-10-CM | POA: Insufficient documentation

## 2014-02-22 DIAGNOSIS — I82401 Acute embolism and thrombosis of unspecified deep veins of right lower extremity: Secondary | ICD-10-CM

## 2014-02-22 DIAGNOSIS — J449 Chronic obstructive pulmonary disease, unspecified: Secondary | ICD-10-CM

## 2014-02-22 DIAGNOSIS — F1010 Alcohol abuse, uncomplicated: Secondary | ICD-10-CM

## 2014-02-22 DIAGNOSIS — R600 Localized edema: Secondary | ICD-10-CM

## 2014-02-22 HISTORY — DX: Acute embolism and thrombosis of unspecified deep veins of unspecified lower extremity: I82.409

## 2014-02-22 LAB — COMPREHENSIVE METABOLIC PANEL
ALT: 23 U/L (ref 0–53)
AST: 18 U/L (ref 0–37)
Albumin: 4.4 g/dL (ref 3.5–5.2)
Alkaline Phosphatase: 72 U/L (ref 39–117)
BUN: 18 mg/dL (ref 6–23)
CO2: 30 mEq/L (ref 19–32)
Calcium: 11 mg/dL — ABNORMAL HIGH (ref 8.4–10.5)
Chloride: 97 mEq/L (ref 96–112)
Creat: 1.01 mg/dL (ref 0.50–1.35)
GLUCOSE: 144 mg/dL — AB (ref 70–99)
POTASSIUM: 4.5 meq/L (ref 3.5–5.3)
Sodium: 136 mEq/L (ref 135–145)
TOTAL PROTEIN: 7.6 g/dL (ref 6.0–8.3)
Total Bilirubin: 0.8 mg/dL (ref 0.2–1.2)

## 2014-02-22 MED ORDER — RIVAROXABAN (XARELTO) VTE STARTER PACK (15 & 20 MG): ORAL_TABLET | ORAL | Status: DC

## 2014-02-22 NOTE — Telephone Encounter (Addendum)
Called and notified by vascular lab that patient ahs RLE popliteal DVT.   He has had poor follow up in the past but given his present situation and negotiating scheduling today I think he will follow up well. I believe he is competent and will understand how to take the medicine. There are no contraindications to oral anticoagulation for him.   Start xarelto today, Rx Sent SDA arranged for tomorrow am.   Laroy Apple, MD [place] Resident, PGY-3 02/22/2014, 11:49 AM

## 2014-02-22 NOTE — Progress Notes (Signed)
VASCULAR LAB PRELIMINARY  PRELIMINARY  PRELIMINARY  PRELIMINARY  Right lower extremity venous duplex completed.    Preliminary report:  Right:  DVT noted in the popliteal vein .  Positive fo superficial thrombosis of calf veins.  No Baker's cyst.  Rosena Bartle, RVS 02/22/2014, 12:53 PM

## 2014-02-22 NOTE — Assessment & Plan Note (Addendum)
New Dx, just notified from vascular lab.  Start Xarelto, Appt tomorrow am to discuss.

## 2014-02-23 ENCOUNTER — Ambulatory Visit (INDEPENDENT_AMBULATORY_CARE_PROVIDER_SITE_OTHER): Payer: Medicaid Other | Admitting: Family Medicine

## 2014-02-23 ENCOUNTER — Encounter: Payer: Self-pay | Admitting: Family Medicine

## 2014-02-23 VITALS — BP 103/63 | HR 99 | Temp 97.4°F | Wt 248.0 lb

## 2014-02-23 DIAGNOSIS — I82401 Acute embolism and thrombosis of unspecified deep veins of right lower extremity: Secondary | ICD-10-CM

## 2014-02-23 DIAGNOSIS — I10 Essential (primary) hypertension: Secondary | ICD-10-CM

## 2014-02-23 DIAGNOSIS — I739 Peripheral vascular disease, unspecified: Secondary | ICD-10-CM

## 2014-02-23 DIAGNOSIS — Z129 Encounter for screening for malignant neoplasm, site unspecified: Secondary | ICD-10-CM

## 2014-02-23 MED ORDER — LISINOPRIL 20 MG PO TABS: 10.0000 mg | ORAL_TABLET | Freq: Every day | ORAL | Status: DC

## 2014-02-23 NOTE — Progress Notes (Signed)
Patient ID: [name], male   DOB: [birth year], 64 y.o.   MRN: [id number] Subjective:   CC: F/u DVT  HPI:   Diagnosed in clinic yesterday with DVT by LE duplex: RLE popliteal DVT. While official read 1/14 stated No evidence of DVT, Dr Wendi Snipes called today and confirmed with radiologist that there in fact was a DVT though not yet corrected on read. Dr Wendi Snipes felt pt could tolerate xarelto and would be compliant so started this yesterday. Pt has not picked up yet due to pharmacy issue. Has h/o DVT in the past for which he was on coumadin, though no longer (states he completed course and was taken of fit).  Having baseline breathing - some shortness of breath at baseline. No new chest pain. No worsened leg symptoms from yesterday; bad right calf pain. Has home health aid and she is there to help him take meds. Has had multiple falls over the past year - 'everything goes black'. Reports some dizziness (lightheaded) x 2 days. Taking norvasc, lisinopril, and lasix. Still smokes daily and does not wish to quit. Drinks alcohol once every few weeks and does not binge per pt.   Review of Systems - Per HPI.   PMH - alcohol abuse, CAD, COPD, diastolic CHF, dizziness, DVT, GERD, gout, HTN, PVD with LE edema, tobnacco abuse  Smoking status: Current every day smoker, resistant to quit    Objective:  Physical Exam BP 103/63 mmHg  Pulse 99  Temp(Src) 97.4 F (36.3 C) (Oral)  Wt 248 lb (112.492 kg) GEN: NAD CV: RRR PULM: CTAB, normal effort EXTR: 1+ B LE edema with B calf tenderness NEURO: Awake, alert, no focal deficits, walks steadily with cane in right hand    Assessment:     [name] is a 64 y.o. male here after dx yesterday with acute DVT.    Plan:     # See problem list and after visit summary for problem-specific plans.   # Health Maintenance: Discussed smoking cessation and colonoscopy (refuses).  Follow-up: Follow up in 1 week to ensure doing okay with  medications and dizziness improved with lower dose lisinopril.   Hilton Sinclair, MD [place]

## 2014-02-23 NOTE — Patient Instructions (Addendum)
Good to see you.  Pick up the xarelto at your pharmacy and start taking daily. Be sure that you do not miss doses. Follow up with GERIATRIC clinic with DR [facility] when able. Decrease lisinopril to 10mg  daily. Please bring back stool cards to our lab. Work on quitting smoking - we can help with this if you decide to - it will lower your risk of clot worsening, stroke, or heart attack. Always use your cane to prevent falls when walking as this would increase risk of a bad bleed in your head. Follow up in 1 week to see how you are doing with all of this. If you have any bleeding, trouble breathing, or other serious concerns, seek immediate re-evaluation.  Best,  Hilton Sinclair, MD  Deep Vein Thrombosis A deep vein thrombosis (DVT) is a blood clot that develops in the deep, larger veins of the leg, arm, or pelvis. These are more dangerous than clots that might form in veins near the surface of the body. A DVT can lead to serious and even life-threatening complications if the clot breaks off and travels in the bloodstream to the lungs.  A DVT can damage the valves in your leg veins so that instead of flowing upward, the blood pools in the lower leg. This is called post-thrombotic syndrome, and it can result in pain, swelling, discoloration, and sores on the leg. CAUSES Usually, several things contribute to the formation of blood clots. Contributing factors include:  The flow of blood slows down.  The inside of the vein is damaged in some way.  You have a condition that makes blood clot more easily. RISK FACTORS Some people are more likely than others to develop blood clots. Risk factors include:   Smoking.  Being overweight (obese).  Sitting or lying still for a long time. This includes long-distance travel, paralysis, or recovery from an illness or surgery. Other factors that increase risk are:   Older age, especially over 94 years of age.  Having a family history of blood  clots or if you have already had a blot clot.  Having major or lengthy surgery. This is especially true for surgery on the hip, knee, or belly (abdomen). Hip surgery is particularly high risk.  Having a long, thin tube (catheter) placed inside a vein during a medical procedure.  Breaking a hip or leg.  Having cancer or cancer treatment.  Pregnancy and childbirth.  Hormone changes make the blood clot more easily during pregnancy.  The fetus puts pressure on the veins of the pelvis.  There is a risk of injury to veins during delivery or a caesarean delivery. The risk is highest just after childbirth.  Medicines containing the male hormone estrogen. This includes birth control pills and hormone replacement therapy.  Other circulation or heart problems.  SIGNS AND SYMPTOMS When a clot forms, it can either partially or totally block the blood flow in that vein. Symptoms of a DVT can include:  Swelling of the leg or arm, especially if one side is much worse.  Warmth and redness of the leg or arm, especially if one side is much worse.  Pain in an arm or leg. If the clot is in the leg, symptoms may be more noticeable or worse when standing or walking. The symptoms of a DVT that has traveled to the lungs (pulmonary embolism, PE) usually start suddenly and include:  Shortness of breath.  Coughing.  Coughing up blood or blood-tinged mucus.  Chest pain. The  chest pain is often worse with deep breaths.  Rapid heartbeat. Anyone with these symptoms should get emergency medical treatment right away. Do not wait to see if the symptoms will go away. Call your local emergency services (911 in the [place] if you have these symptoms. Do not drive yourself to the hospital. DIAGNOSIS If a DVT is suspected, your health care provider will take a full medical history and perform a physical exam. Tests that also may be required include:  Blood tests, including studies of the clotting properties of  the blood.  Ultrasound to see if you have clots in your legs or lungs.  X-rays to show the flow of blood when dye is injected into the veins (venogram).  Studies of your lungs if you have any chest symptoms. PREVENTION  Exercise the legs regularly. Take a brisk 30-minute walk every day.  Maintain a weight that is appropriate for your height.  Avoid sitting or lying in bed for long periods of time without moving your legs.  Women, particularly those over the age of 7 years, should consider the risks and benefits of taking estrogen medicines, including birth control pills.  Do not smoke, especially if you take estrogen medicines.  Long-distance travel can increase your risk of DVT. You should exercise your legs by walking or pumping the muscles every hour.  Many of the risk factors above relate to situations that exist with hospitalization, either for illness, injury, or elective surgery. Prevention may include medical and nonmedical measures.  Your health care provider will assess you for the need for venous thromboembolism prevention when you are admitted to the hospital. If you are having surgery, your surgeon will assess you the day of or day after surgery. TREATMENT Once identified, a DVT can be treated. It can also be prevented in some circumstances. Once you have had a DVT, you may be at increased risk for a DVT in the future. The most common treatment for DVT is blood-thinning (anticoagulant) medicine, which reduces the blood's tendency to clot. Anticoagulants can stop new blood clots from forming and stop old clots from growing. They cannot dissolve existing clots. Your body does this by itself over time. Anticoagulants can be given by mouth, through an IV tube, or by injection. Your health care provider will determine the best program for you. Other medicines or treatments that may be used are:  Heparin or related medicines (low molecular weight heparin) are often the first  treatment for a blood clot. They act quickly. However, they cannot be taken orally and must be given either in shot form or by IV tube.  Heparin can cause a fall in a component of blood that stops bleeding and forms blood clots (platelets). You will be monitored with blood tests to be sure this does not occur.  Warfarin is an anticoagulant that can be swallowed. It takes a few days to start working, so usually heparin or related medicines are used in combination. Once warfarin is working, heparin is usually stopped.  Factor Xa inhibitor medicines, such as rivaroxaban and apixaban, also reduce blood clotting. These medicines are taken orally and can often be used without heparin or related medicines.  Less commonly, clot dissolving drugs (thrombolytics) are used to dissolve a DVT. They carry a high risk of bleeding, so they are used mainly in severe cases where your life or a part of your body is threatened.  Very rarely, a blood clot in the leg needs to be removed surgically.  If  you are unable to take anticoagulants, your health care provider may arrange for you to have a filter placed in a main vein in your abdomen. This filter prevents clots from traveling to your lungs. HOME CARE INSTRUCTIONS  Take all medicines as directed by your health care provider.  Learn as much as you can about DVT.  Wear a medical alert bracelet or carry a medical alert card.  Ask your health care provider how soon you can go back to normal activities. It is important to stay active to prevent blood clots. If you are on anticoagulant medicine, avoid contact sports.  It is very important to exercise. This is especially important while traveling, sitting, or standing for long periods of time. Exercise your legs by walking or by tightening and relaxing your leg muscles regularly. Take frequent walks.  You may need to wear compression stockings. These are tight elastic stockings that apply pressure to the lower legs.  This pressure can help keep the blood in the legs from clotting. Taking Warfarin Warfarin is a daily medicine that is taken by mouth. Your health care provider will advise you on the length of treatment (usually 3-6 months, sometimes lifelong). If you take warfarin:  Understand how to take warfarin and foods that can affect how warfarin works in Veterinary surgeon.  Too much and too little warfarin are both dangerous. Too much warfarin increases the risk of bleeding. Too little warfarin continues to allow the risk for blood clots. Warfarin and Regular Blood Testing While taking warfarin, you will need to have regular blood tests to measure your blood clotting time. These blood tests usually include both the prothrombin time (PT) and international normalized ratio (INR) tests. The PT and INR results allow your health care provider to adjust your dose of warfarin. It is very important that you have your PT and INR tested as often as directed by your health care provider.  Warfarin and Your Diet Avoid major changes in your diet, or notify your health care provider before changing your diet. Arrange a visit with a registered dietitian to answer your questions. Many foods, especially foods high in vitamin K, can interfere with warfarin and affect the PT and INR results. You should eat a consistent amount of foods high in vitamin K. Foods high in vitamin K include:   Spinach, kale, broccoli, cabbage, collard and turnip greens, Brussels sprouts, peas, cauliflower, seaweed, and parsley.  Beef and pork liver.  Green tea.  Soybean oil. Warfarin with Other Medicines Many medicines can interfere with warfarin and affect the PT and INR results. You must:  Tell your health care provider about any and all medicines, vitamins, and supplements you take, including aspirin and other over-the-counter anti-inflammatory medicines. Be especially cautious with aspirin and anti-inflammatory medicines. Ask your health care  provider before taking these.  Do not take or discontinue any prescribed or over-the-counter medicine except on the advice of your health care provider or pharmacist. Warfarin Side Effects Warfarin can have side effects, such as easy bruising and difficulty stopping bleeding. Ask your health care provider or pharmacist about other side effects of warfarin. You will need to:  Hold pressure over cuts for longer than usual.  Notify your dentist and other health care providers that you are taking warfarin before you undergo any procedures where bleeding may occur. Warfarin with Alcohol and Tobacco   Drinking alcohol frequently can increase the effect of warfarin, leading to excess bleeding. It is best to avoid alcoholic drinks or  to consume only very small amounts while taking warfarin. Notify your health care provider if you change your alcohol intake.   Do not use any tobacco products including cigarettes, chewing tobacco, or electronic cigarettes. If you smoke, quit. Ask your health care provider for help with quitting smoking. Alternative Medicines to Warfarin: Factor Xa Inhibitor Medicines  These blood-thinning medicines are taken by mouth, usually for several weeks or longer. It is important to take the medicine every single day at the same time each day.  There are no regular blood tests required when using these medicines.  There are fewer food and drug interactions than with warfarin.  The side effects of this class of medicine are similar to those of warfarin, including excessive bruising or bleeding. Ask your health care provider or pharmacist about other potential side effects. SEEK MEDICAL CARE IF:  You notice a rapid heartbeat.  You feel weaker or more tired than usual.  You feel faint.  You notice increased bruising.  You feel your symptoms are not getting better in the time expected.  You believe you are having side effects of medicine. SEEK IMMEDIATE MEDICAL CARE  IF:  You have chest pain.  You have trouble breathing.  You have new or increased swelling or pain in one leg.  You cough up blood.  You notice blood in vomit, in a bowel movement, or in urine. MAKE SURE YOU:  Understand these instructions.  Will watch your condition.  Will get help right away if you are not doing well or get worse. Document Released: 01/26/2005 Document Revised: 06/12/2013 Document Reviewed: 10/03/2012 [facility] Patient Information 2015 [place], [place]. This information is not intended to replace advice given to you by your health care provider. Make sure you discuss any questions you have with your health care provider.

## 2014-02-23 NOTE — Assessment & Plan Note (Addendum)
2nd DVT. Increased fall risk compared to general population due to h/o multiple falls this year per patient, though likely has lower risk of intracranial bleed compared to risk of PE from current DVT. No current worsened dyspnea and O2 sat 95% on RA. Likely mild dizziness from low blood pressure given pressure in clinic today (100 compared to 130-150s SBP). - Urged to pick up xarelto today and take regularly. - F/u Geriatric clinic to review medications (bring all in a bag) and discuss fall risk mitigation. Continue to use cane regularly. - Decrease lisinopril to 10mg  daily to prevent BP getting too low for next few days given currently low. - Age-appropriate cancer screening - colonoscopy, though pt refuses for now. Ordered stool cards in stead. However, pt has hemorrhoids so interpretation is limited. - Smoking cessation discussed to reduce risk of clots/stroke/MI. Will defer decision on low-dose chest CT to PCP. - F/u 1 week. Return precautions reviewed.

## 2014-02-24 ENCOUNTER — Telehealth: Payer: Self-pay | Admitting: Family Medicine

## 2014-02-24 NOTE — Telephone Encounter (Signed)
I personally discussed his scan with Dr. Russella Dar on the morning of 02/23/2014, the reading radiologist who confirms that there is a popliteal DVT on the R side. He states that there was a mistake in charting and that he will correct the documentation, however this is not finished yet.   Laroy Apple, MD [place] Resident, PGY-3 02/24/2014, 10:43 AM

## 2014-03-14 ENCOUNTER — Telehealth: Payer: Self-pay | Admitting: *Deleted

## 2014-03-14 NOTE — Telephone Encounter (Signed)
Pt had his nurse aid call clinic this AM stating she saw a pool of blood after pt used the bathroom.  Spoke with pt; stated he had two bowel movements that were full of blood.  He also stated when he blows his nose, it starts to bleed.  Pt started on XARELTO 02/22/2014.  Per Dr. Wendi Snipes; pt should be taken to the emergency room for further evaluation.  Pt stated understanding. Derl Barrow, RN

## 2014-04-13 DIAGNOSIS — H2512 Age-related nuclear cataract, left eye: Secondary | ICD-10-CM | POA: Diagnosis not present

## 2014-04-13 DIAGNOSIS — H524 Presbyopia: Secondary | ICD-10-CM | POA: Diagnosis not present

## 2014-04-13 DIAGNOSIS — H52203 Unspecified astigmatism, bilateral: Secondary | ICD-10-CM | POA: Diagnosis not present

## 2014-04-13 DIAGNOSIS — H5213 Myopia, bilateral: Secondary | ICD-10-CM | POA: Diagnosis not present

## 2014-05-14 IMAGING — CT CT ANGIO AOBIFEM WO/W CM
1 of 8 series · 11 of 33 positions shown · IV contrast ([ID] OMNI 350)
Comparison: 03/14/2012

CLINICAL DATA: Claudication

CT ANGIOGRAPHY OF ABDOMINAL AORTA WITH ILIOFEMORAL RUNOFF
TECHNIQUE: Multidetector CT imaging of the abdomen, pelvis and
lower extremities was performed using the standard protocol during
bolus administration of intravenous contrast.  Multiplanar CT image
reconstructions including MIPs were obtained to evaluate the
vascular anatomy.
Contrast: 100mL OMNIPAQUE IOHEXOL 350 MG/ML SOLN

[Series 3: runoff · axial · 0.92mm/px · z∈[-1238,-106]mm · 11 of 542 slices shown]
[im 46/542  soft-tissue]
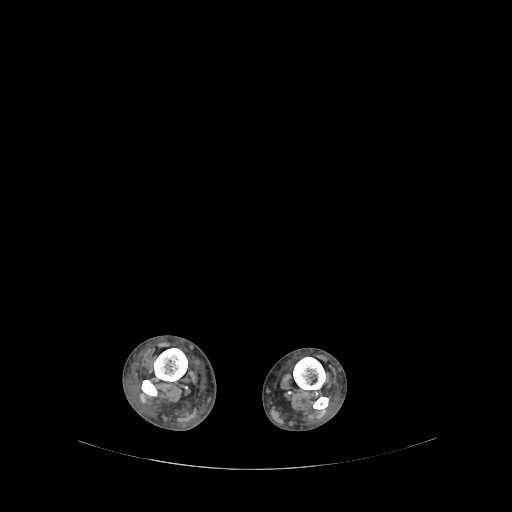
[im 91/542  bone]
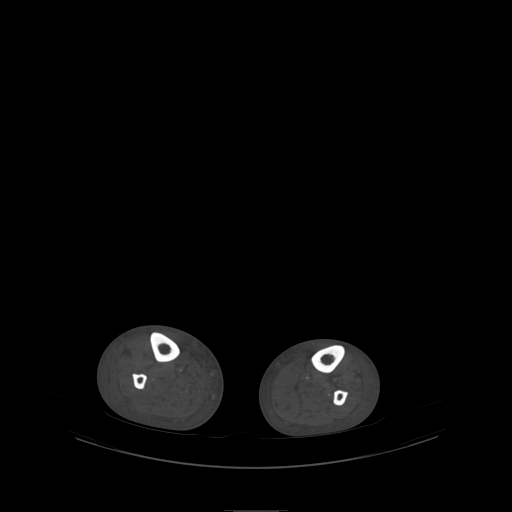
[im 136/542  soft-tissue]
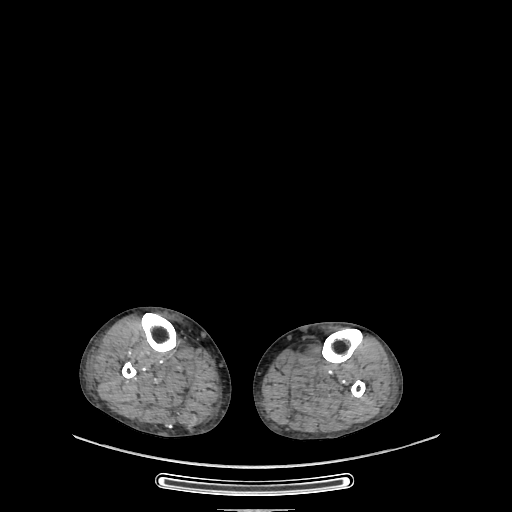
[im 181/542  bone]
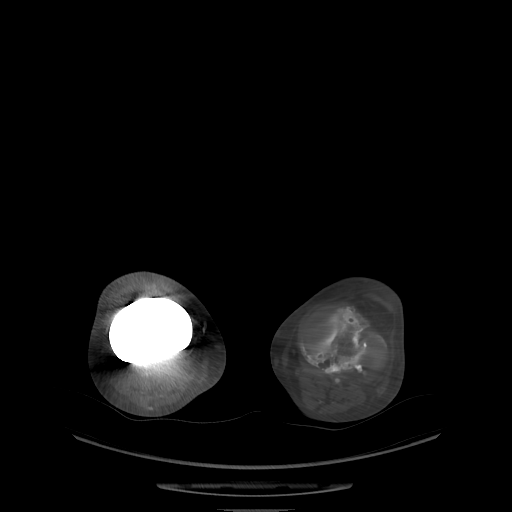
[im 226/542  soft-tissue]
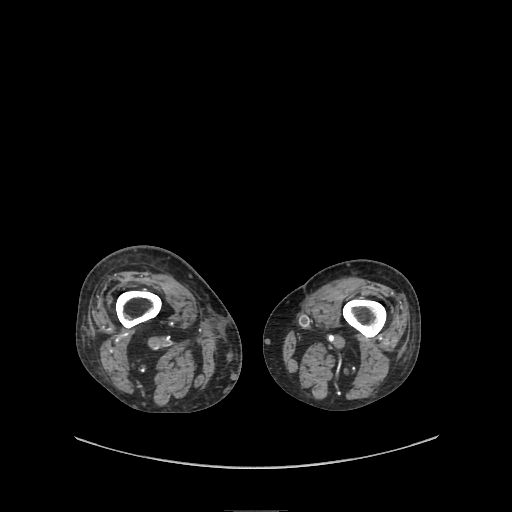
[im 271/542  bone]
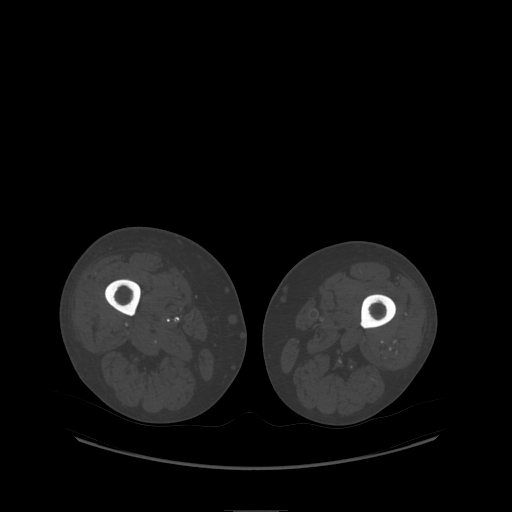
[im 316/542  soft-tissue]
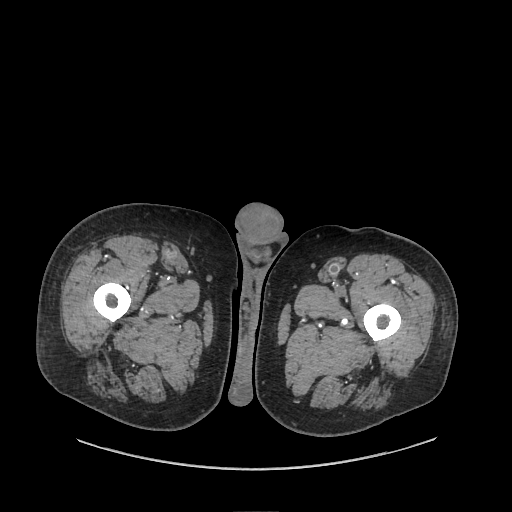
[im 361/542  bone]
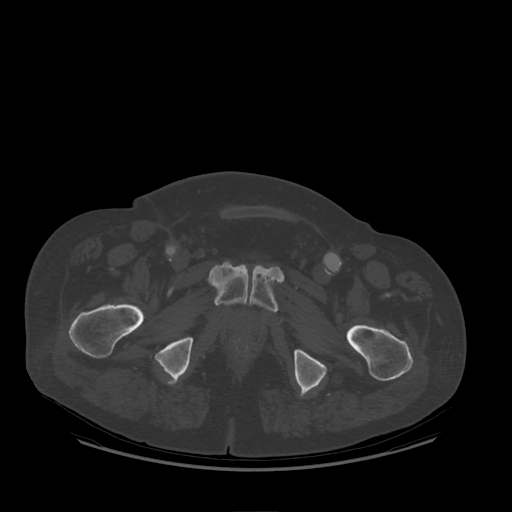
[im 406/542  soft-tissue]
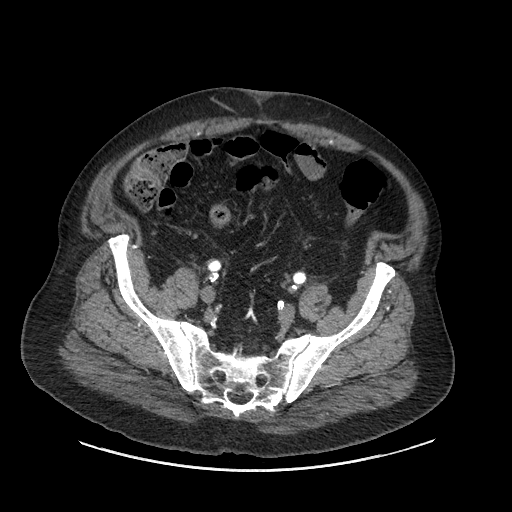
[im 451/542  bone]
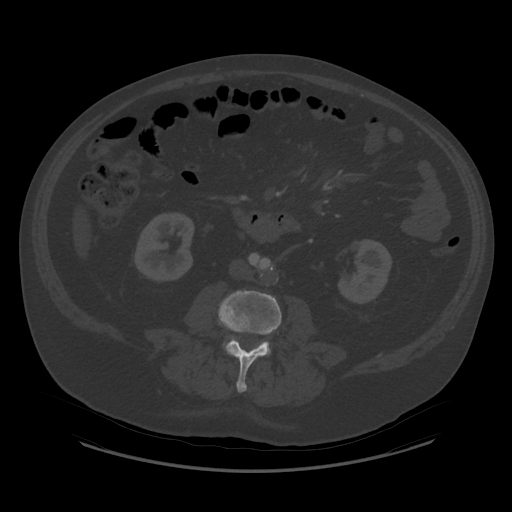
[im 496/542  soft-tissue]
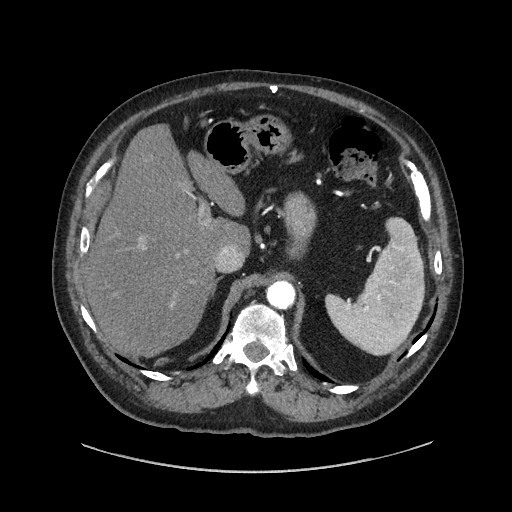

[11 of 33 positions shown; findings below may reference images not displayed]

FINDINGS: Diffuse atherosclerotic changes and irregular plaque of the lower
thoracic aorta and abdominal aorta without significant focal
narrowing.

Celiac axis is patent.  Branch vessels are patent.

SMA is patent.  Branch vessels are patent.  A large middle colic
artery is noted.

IMA origin is occluded.  Branches reconstitute via the middle colic
artery prominent.

Bilateral single renal arteries are patent.

Aorto bifemoral bypass graft is widely patent.  A femoral to
femoral bypass graft fragment at the midline is occluded.

Native distal aorta, right common iliac artery, right external
iliac artery are occluded.  Native left common and external iliac
arteries are occluded.  Bilateral internal iliac artery branches
are very diminutive and reconstitute.

The distal anastomosis of the graft to the right common femoral
artery is aneurysmal with a diameter of 2.0 cm.  Below the
anastomosis, the right common femoral artery is patent.  The right
superficial femoral artery is occluded.  A femoral to popliteal
artery bypass graft is occluded. Profunda femoral artery branches
largely reconstitute.  I believe the origin of this vessel is
occluded. The popliteal artery reconstitutes above the knee joint
but is severely diminutive.  Streak artifact limits the evaluation
from the total knee arthroplasty.  Below the knee, the popliteal
artery and tibioperoneal trunk are patent.  Two-vessel runoff to
the right ankle via the posterior tibial and peroneal arteries.

The distal anastomosis of the graft to the left common femoral
artery is aneurysmal measuring 2.0 cm.  The left superficial
femoral artery is occluded.  The profunda femoral artery is patent.
A left femoral to popliteal bypass graft above the knee is
occluded.  The left popliteal artery reconstitutes well above the
knee joint.  There is two-vessel runoff to the left ankle via the
posterior tibial and peroneal arteries.

Dependent atelectasis at the lung bases.

Diffuse hepatic steatosis.  Post cholecystectomy.

The spleen, pancreas, adrenal glands are within normal limits.
Calcification within the central kidneys are likely vascular.
There is scarring and/or infarct involving the lower pole of the
left kidney.  This is likely related to a sacrificed tiny accessory
renal artery to one segment of the lower pole of the left kidney.

Hazy stranding within the mesenteric fat is of unknown significance
and likely a chronic finding.  Small mesenteric nodes are
associated.

Postoperative changes in the lumbar spine.  Postoperative changes
at the gastroesophageal junction with a fundal plication are
present.  No free fluid or abnormal adenopathy.  Bladder and
prostate are within normal limits.

 Review of the MIP images confirms the above findings.
IMPRESSION: The aortobifemoral bypass graft is widely patent.  Above the graft,
the aorta is patent.

The right common femoral artery is patent.  However, the right
superficial femoral artery, a femoral popliteal bypass graft, and
the origin of the profunda femoral artery are all occluded.
Profunda femoral artery branches reconstitute.  The popliteal
artery reconstitutes and there is two-vessel runoff to the right
ankle.

 The left superficial femoral artery is occluded but the profunda
femoral artery is patent.  A femoral popliteal bypass graft is also
occluded.  The left popliteal artery reconstitutes above the knee
joint and there is two-vessel runoff to the left ankle.

## 2014-05-24 ENCOUNTER — Telehealth: Payer: Self-pay | Admitting: Family Medicine

## 2014-05-24 NOTE — Telephone Encounter (Signed)
Needs authorization for a breathalyzer. He does not know the company name but the phone number is [phone]. [name], ASA

## 2014-05-28 NOTE — Telephone Encounter (Signed)
Per PCP, we do not provided prior authorizations for breathalyzers, left message on voicemail informing patient.

## 2014-05-31 IMAGING — CR DG CHEST 2V
1 series · 1 of 1 positions shown · non-contrast
Comparison: 04/21/12

CLINICAL DATA: Hip pain.

CHEST - 2 VIEW

[w chest lat]
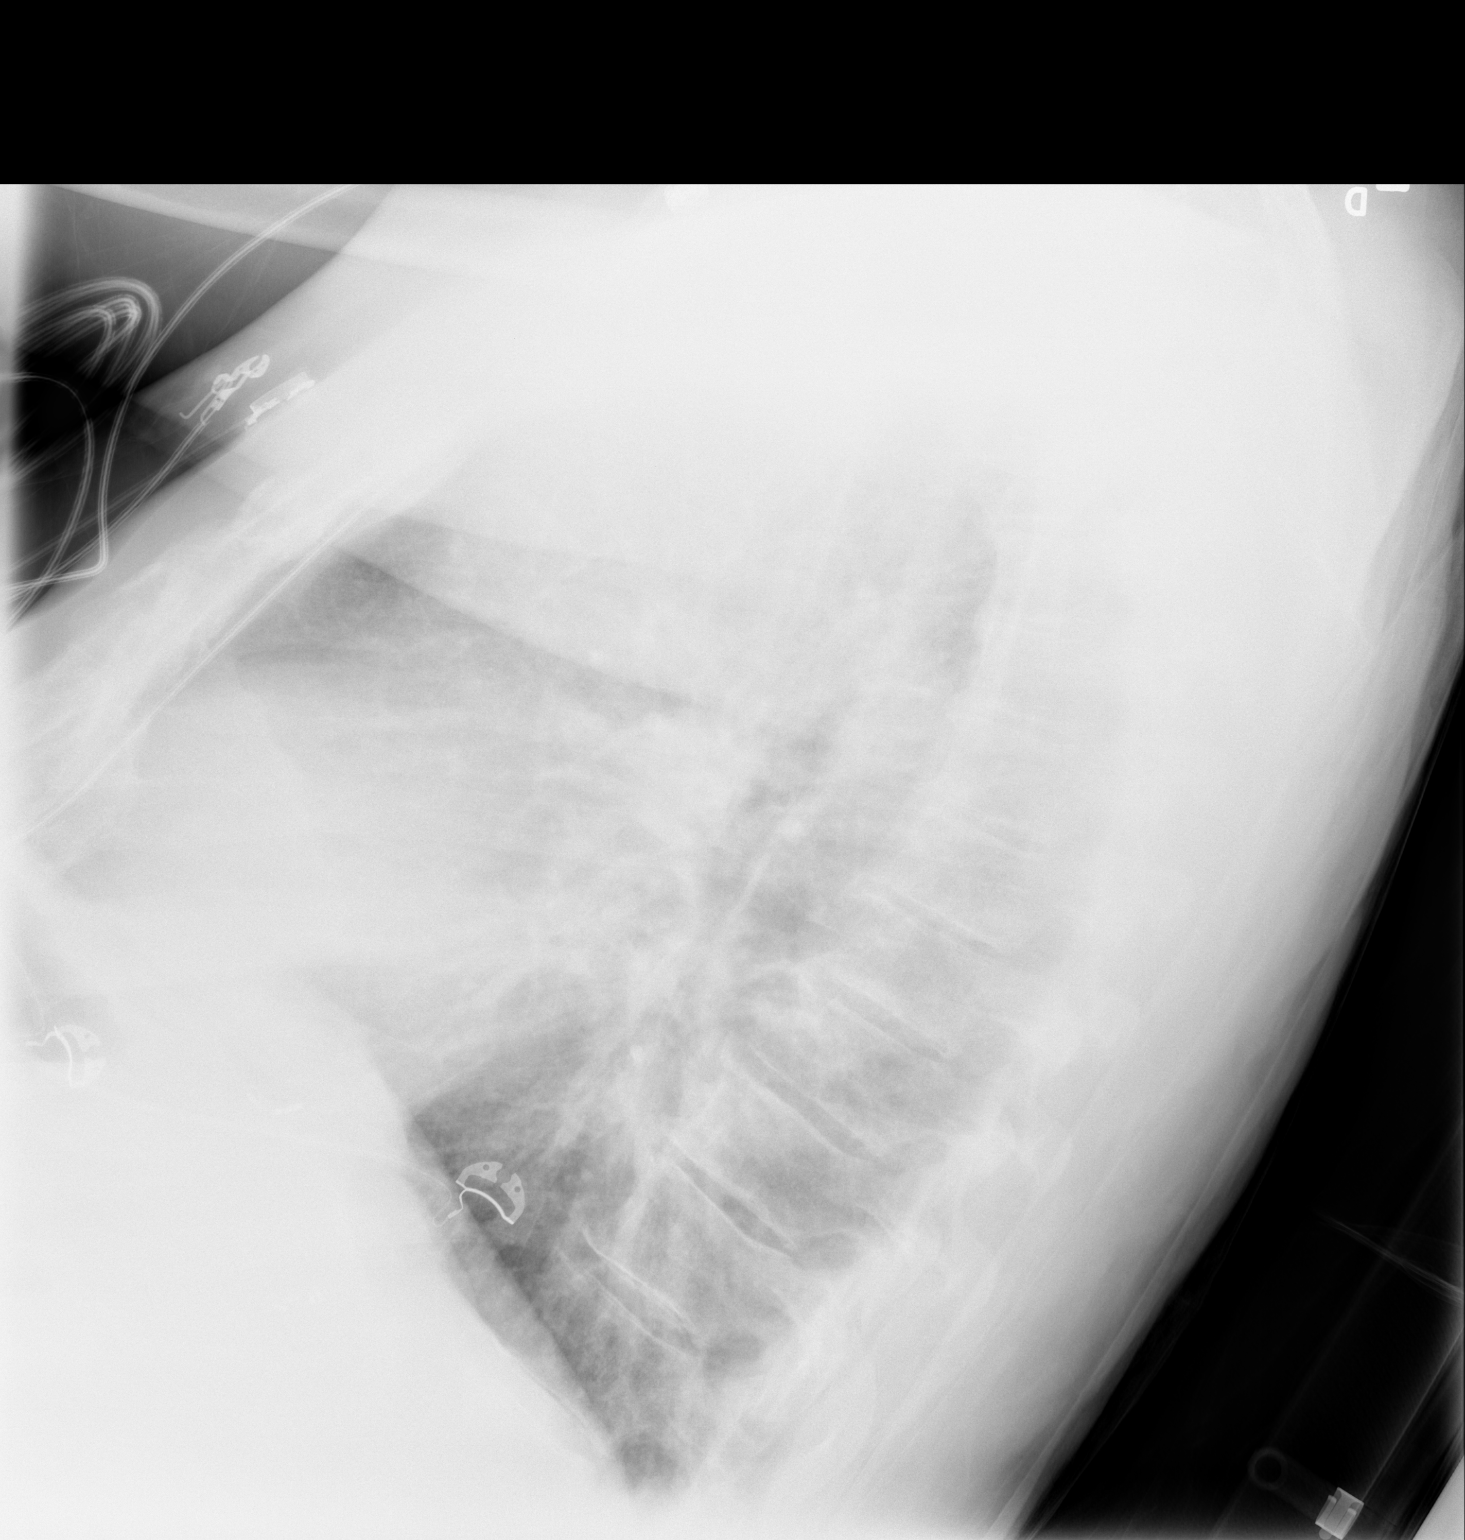

[1 of 1 positions shown; findings below may reference images not displayed]

FINDINGS: The heart size is enlarged.  There is no pleural effusion
or interstitial edema.  The lung volumes appear lobe.  No airspace
consolidation.  Spondylosis is identified within the thoracic
spine.
IMPRESSION: 1.  Cardiac enlargement.
2.  Low lung volumes.

## 2014-05-31 IMAGING — CT CT ANGIO CHEST
2 of 7 series · 17 of 46 positions shown · IV contrast (omnipaque)
Comparison: [HOSPITAL] CTA abdomen pelvis with runoff
dated 08/10/2012.  [HOSPITAL] CTA chest dated 04/04/2012.

CTA CHEST

CLINICAL DATA: Severe pain, evaluate for dissection

CT ANGIOGRAPHY CHEST, ABDOMEN AND PELVIS
TECHNIQUE: Multidetector CT imaging through the chest, abdomen and
pelvis was performed using the standard protocol during bolus
administration of intravenous contrast.  Multiplanar reconstructed
images including MIPs were obtained and reviewed to evaluate the
vascular anatomy.
Contrast: 80mL OMNIPAQUE IOHEXOL 350 MG/ML SOLN

[Series 6: dissection 2.0 i30f 3 · axial · 0.82mm/px · z∈[-637,-43]mm · 14 of 333 slices shown]
[im 18/333  lung]
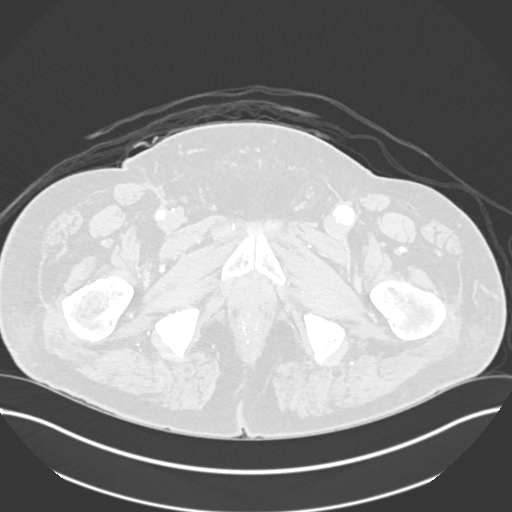
[im 35/333  soft-tissue]
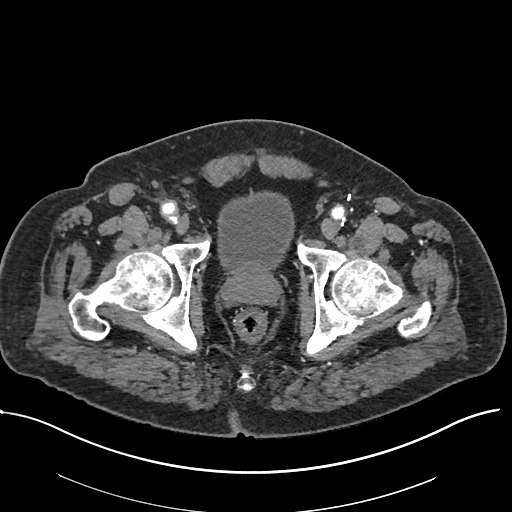
[im 70/333  lung]
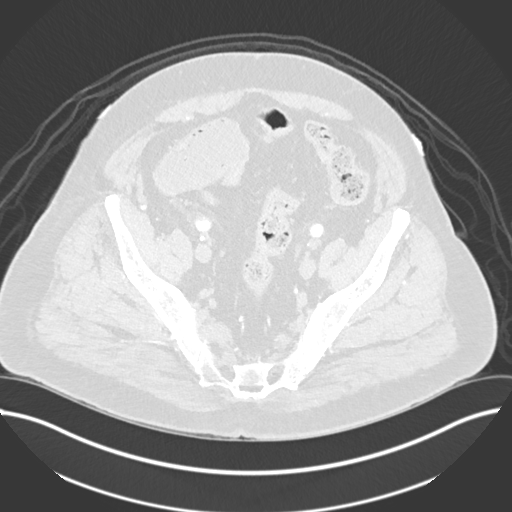
[im 88/333  soft-tissue]
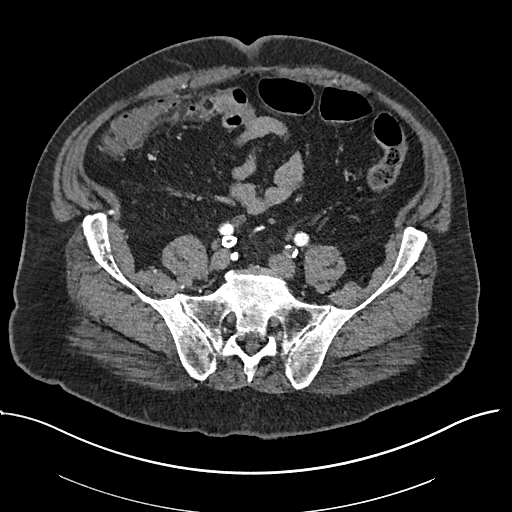
[im 105/333  lung]
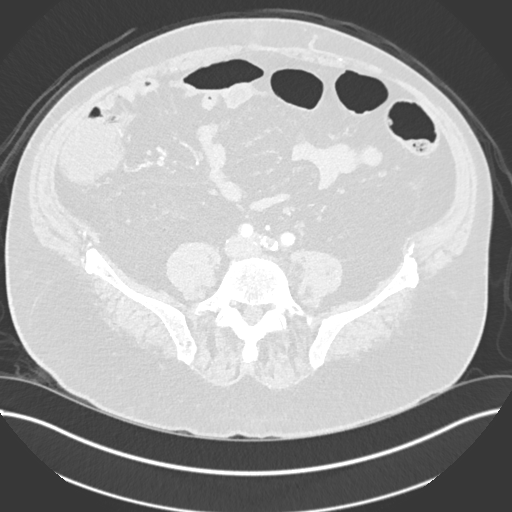
[im 140/333  soft-tissue]
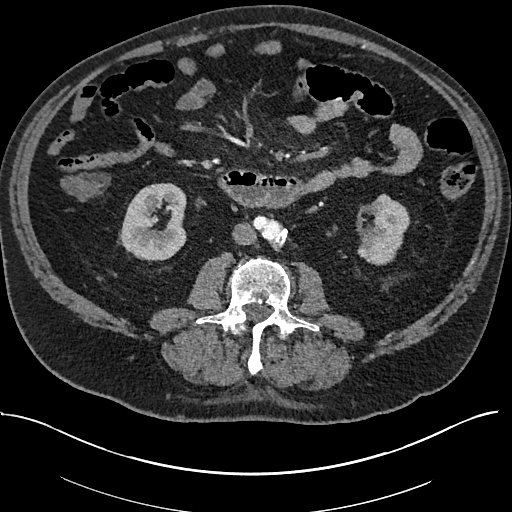
[im 158/333  lung]
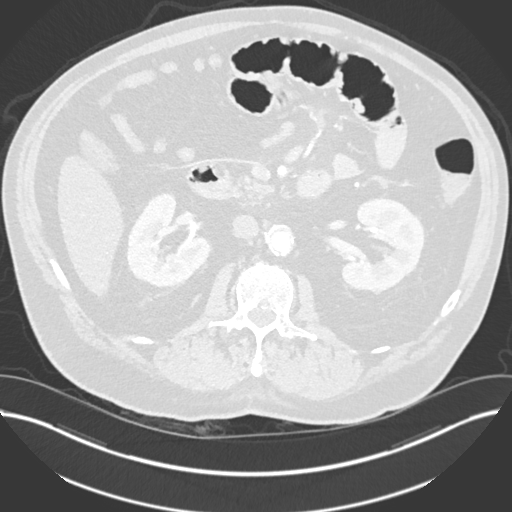
[im 175/333  soft-tissue]
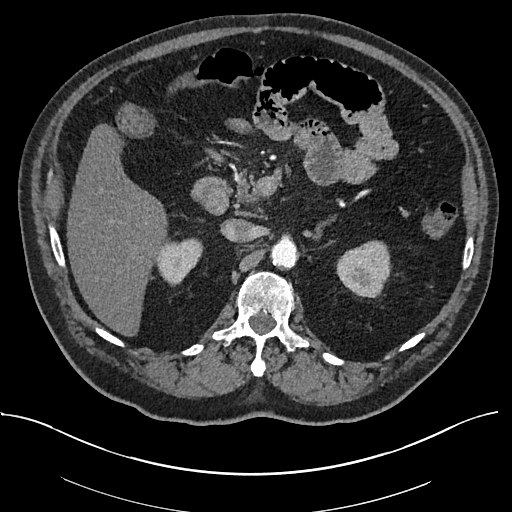
[im 193/333  lung]
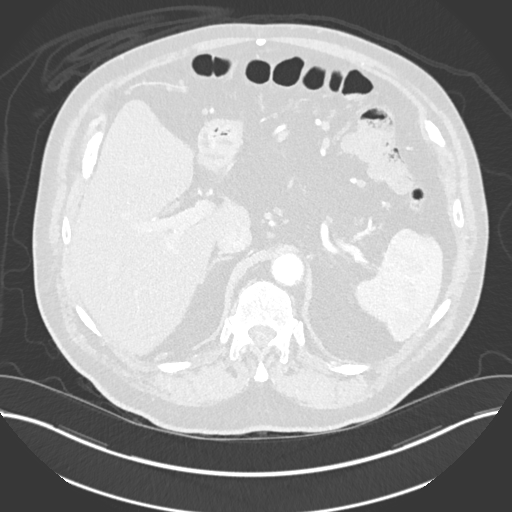
[im 228/333  soft-tissue]
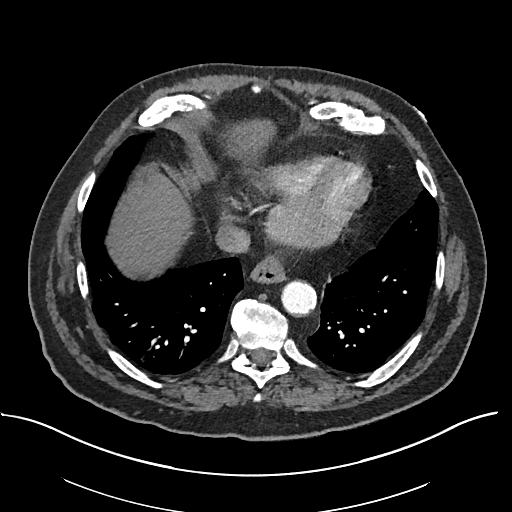
[im 245/333  lung]
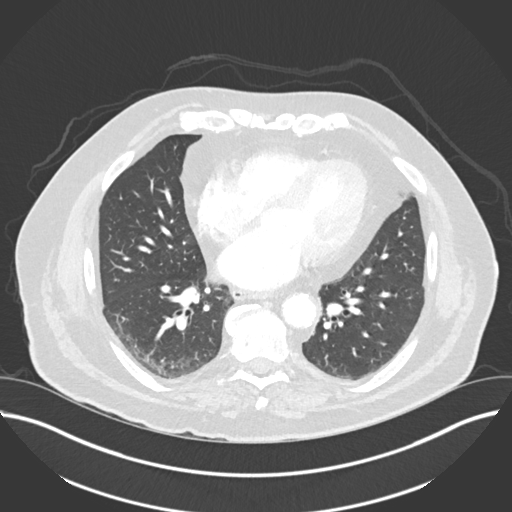
[im 263/333  soft-tissue]
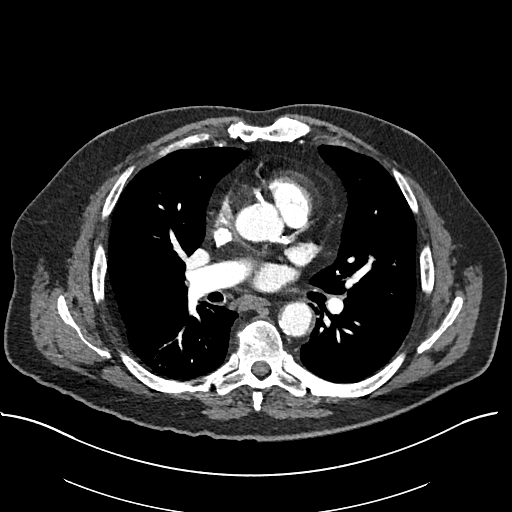
[im 298/333  lung]
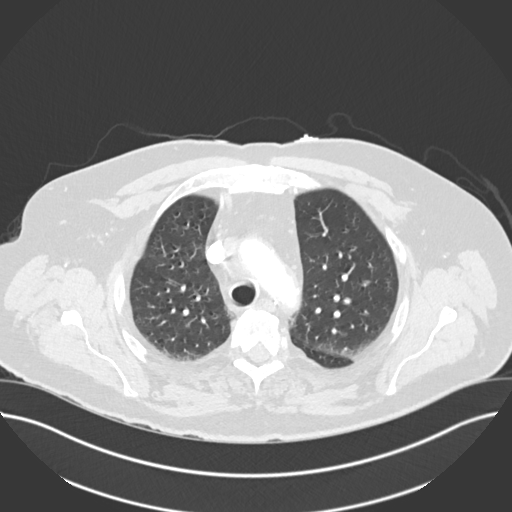
[im 315/333  soft-tissue]
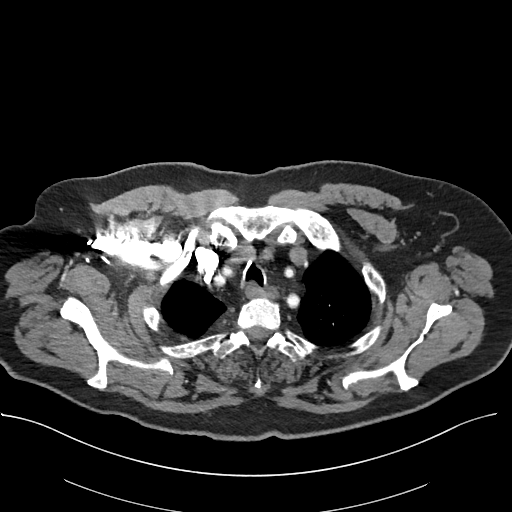

[Series 8: coronal mpr · coronal · 0.97mm/px · 3 of 164 slices shown]
[im 41/164  soft-tissue]
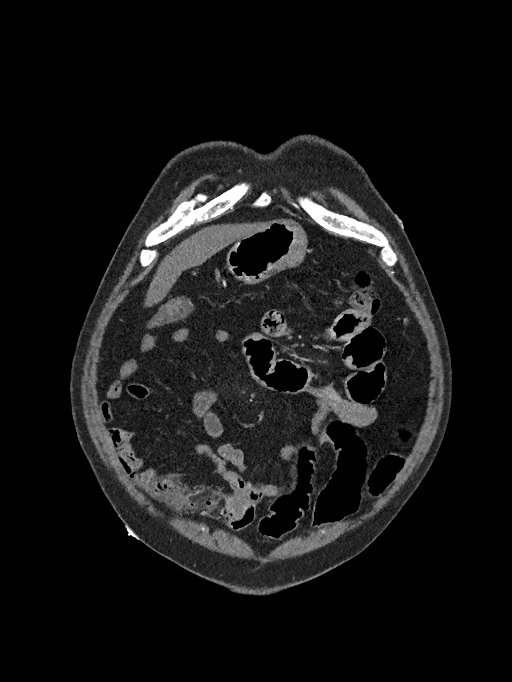
[im 82/164  soft-tissue]
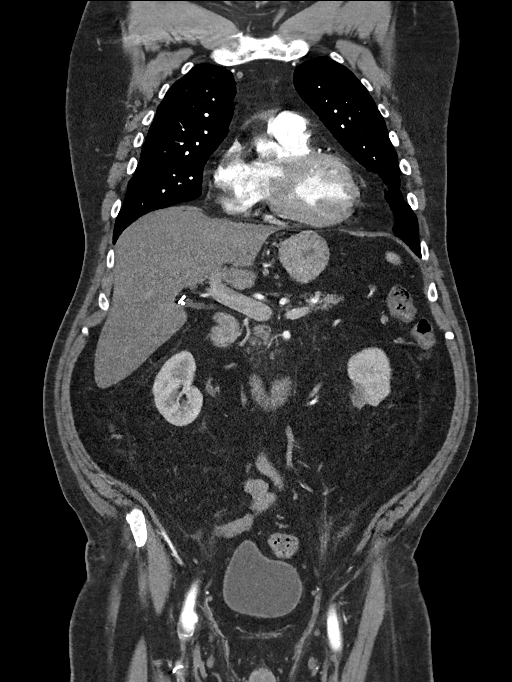
[im 123/164  soft-tissue]
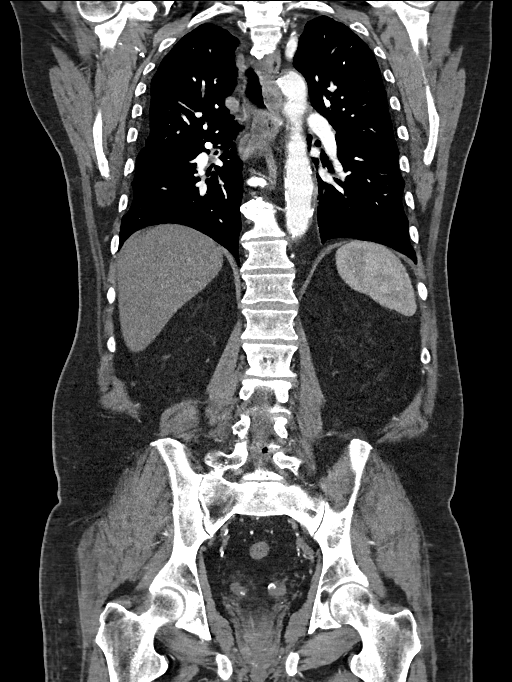

[17 of 46 positions shown; findings below may reference images not displayed]

FINDINGS: No evidence of thoracic aortic aneurysm or dissection.
No evidence of mediastinal hematoma.

Although not specifically tailored for evaluation of the pulmonary
arteries, there is no evidence of pulmonary embolism.

Mild dependent atelectasis in the bilateral upper lobes and right
lower lobe.  No suspicious pulmonary nodules.  No pleural effusion
or pneumothorax.

Visualized thyroid is unremarkable.

The heart is top normal in size with prominent epicardial fat.  No
pericardial effusion. Coronary atherosclerosis.  Atherosclerotic
calcifications of the aortic arch.

Eccentric mural thrombus along the proximal descending thoracic
aorta (series 6/image 42) and along the origin of the right
innominate artery (series 6/image 28).

No suspicious mediastinal, hilar, or axillary lymphadenopathy.

Degenerative changes of the thoracic spine.

Review of the MIP images confirms the above findings.
IMPRESSION: No evidence of thoracic aortic aneurysm or dissection.

No evidence of pulmonary embolism.

No evidence of acute cardiopulmonary disease.

CTA ABDOMEN AND PELVIS
FINDINGS: No evidence of abdominal aortic aneurysm or dissection.

Eccentric mural thrombus along the upper abdominal aorta.  Aortobi-
iliac bypass graft.  Atherosclerotic dislocations of the abdominal
aorta and branch vessels.  Celiac artery and SMA are patent.  IMA
is occluded.  Stable appearance at the distal anastomoses of the
aortobifemoral graft bilaterally.

Tiny hiatal hernia.  Postsurgical changes related to suspected
Nissen fundoplication wrap.

Moderate hepatic steatosis.

Spleen, pancreas, and adrenal glands are within normal limits.

Status post cholecystectomy.  No intrahepatic or extrahepatic
ductal dilatation.

Kidneys are within normal limits, noting vascular calcifications.
No hydronephrosis.

No evidence of bowel obstruction.

No abdominopelvic ascites.

No suspicious abdominopelvic lymphadenopathy.

Prostate is unremarkable.

Bladder is within normal limits.

Degenerative changes of the lumbar spine.

Review of the MIP images confirms the above findings.
IMPRESSION: No evidence of abdominal aortic aneurysm or dissection.

No interval change from recent CTA abdomen/pelvis with runoff.

## 2014-06-01 IMAGING — CR DG CHEST 2V
2 series · 2 of 2 positions shown · non-contrast
Comparison: 08/27/12

CLINICAL DATA: Shortness of breath.

CHEST - 2 VIEW

[w chest lat]
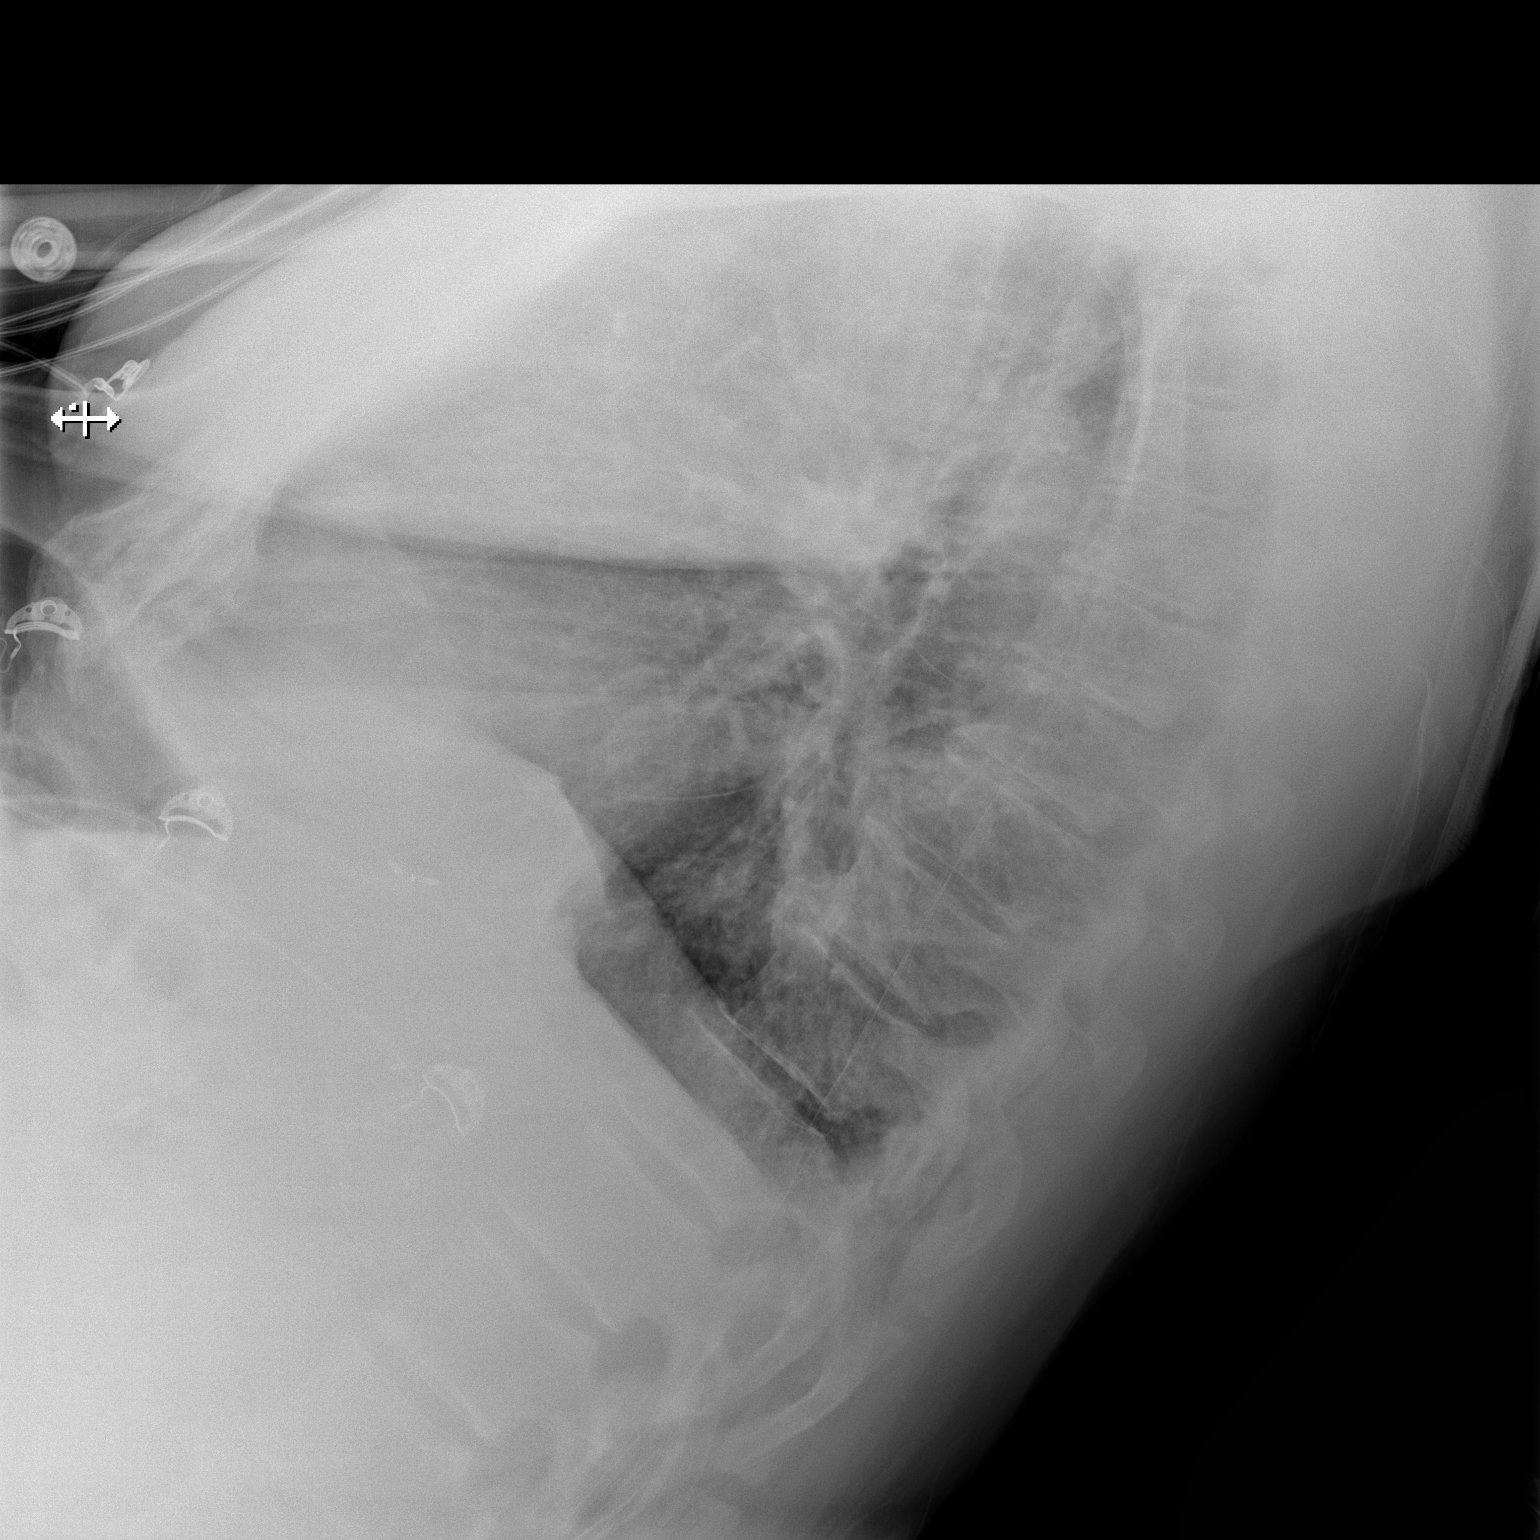

[x chest ap]
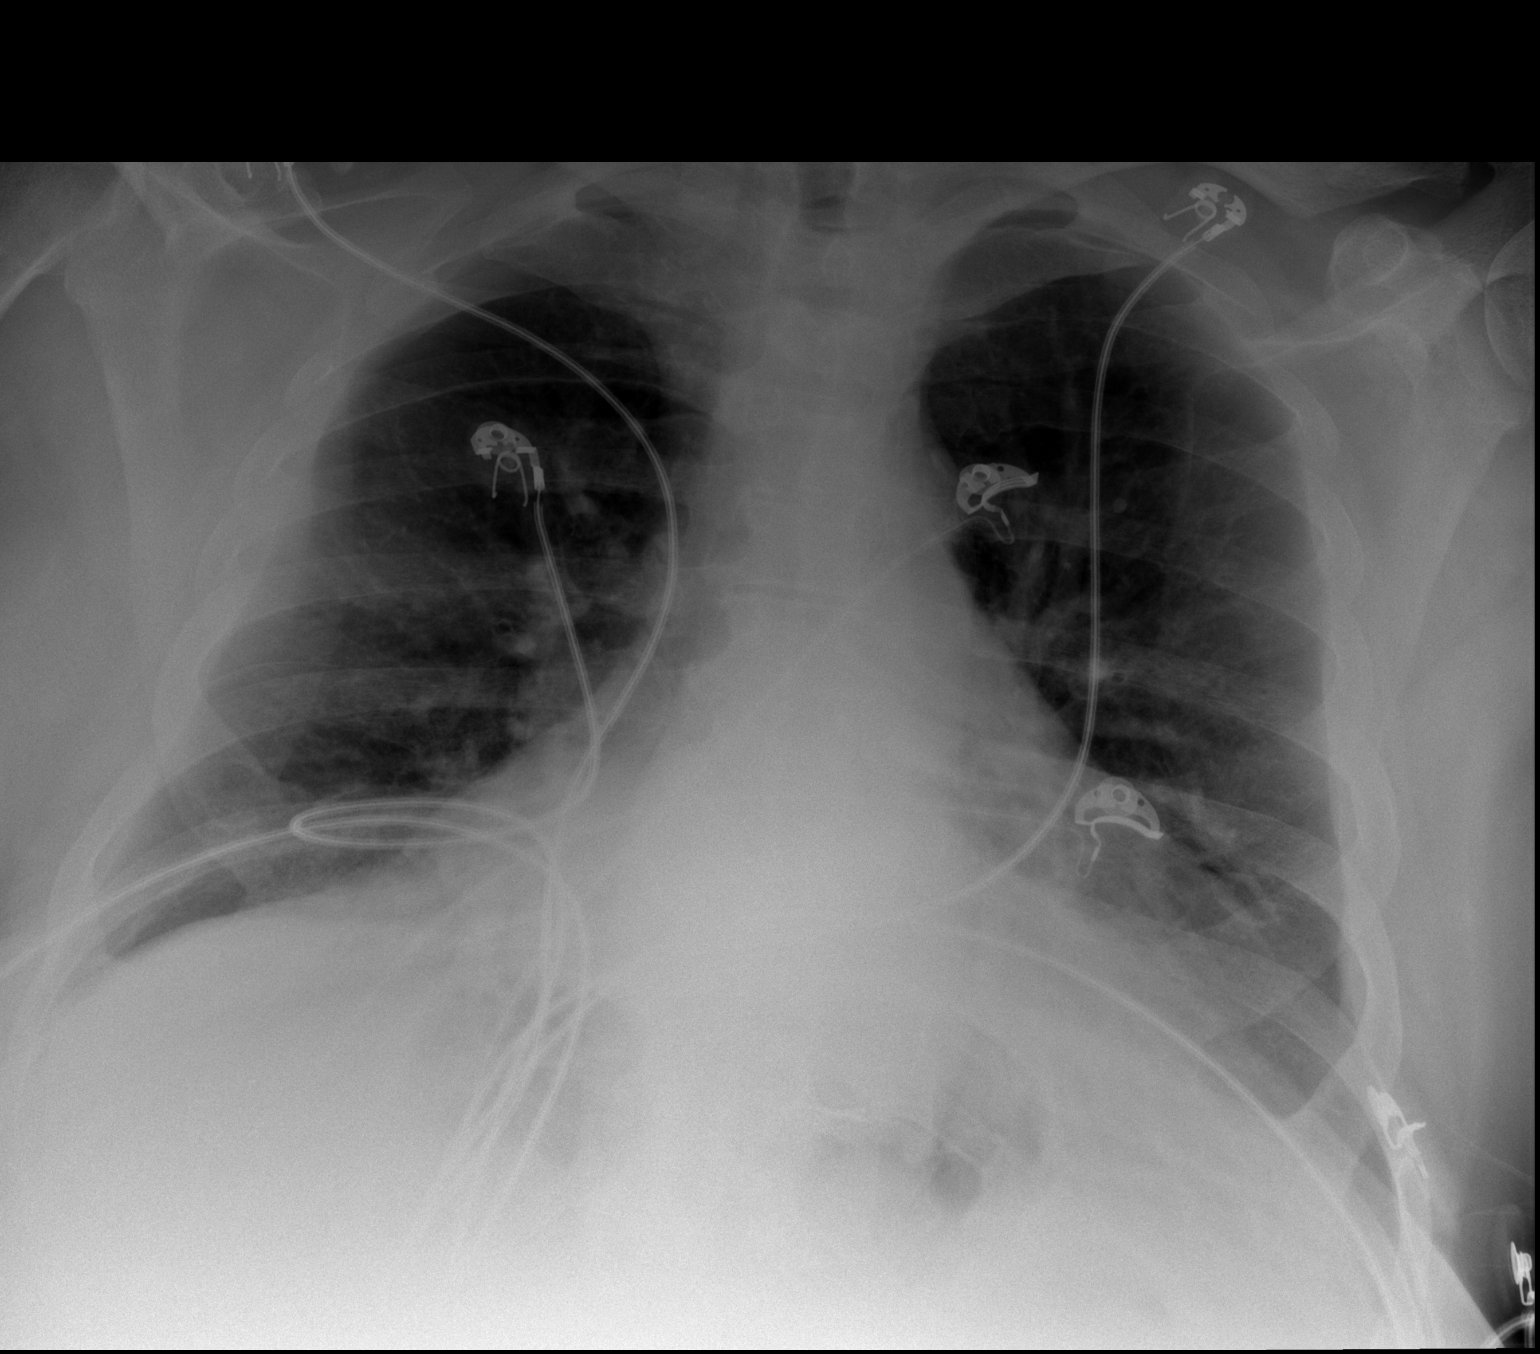

[2 of 2 positions shown; findings below may reference images not displayed]

FINDINGS: Normal heart size.  No pleural effusion or edema.  No airspace
consolidation identified.  Mild spondylosis noted within the
thoracic spine.
IMPRESSION: 1.  No acute cardiopulmonary abnormalities.]

## 2014-06-15 IMAGING — CR DG CHEST 2V
3 series · 3 of 3 positions shown · non-contrast
Comparison: 08/28/2012

CLINICAL DATA: Cough, hypertension, CHF

CHEST - 2 VIEW

[w chest lat]
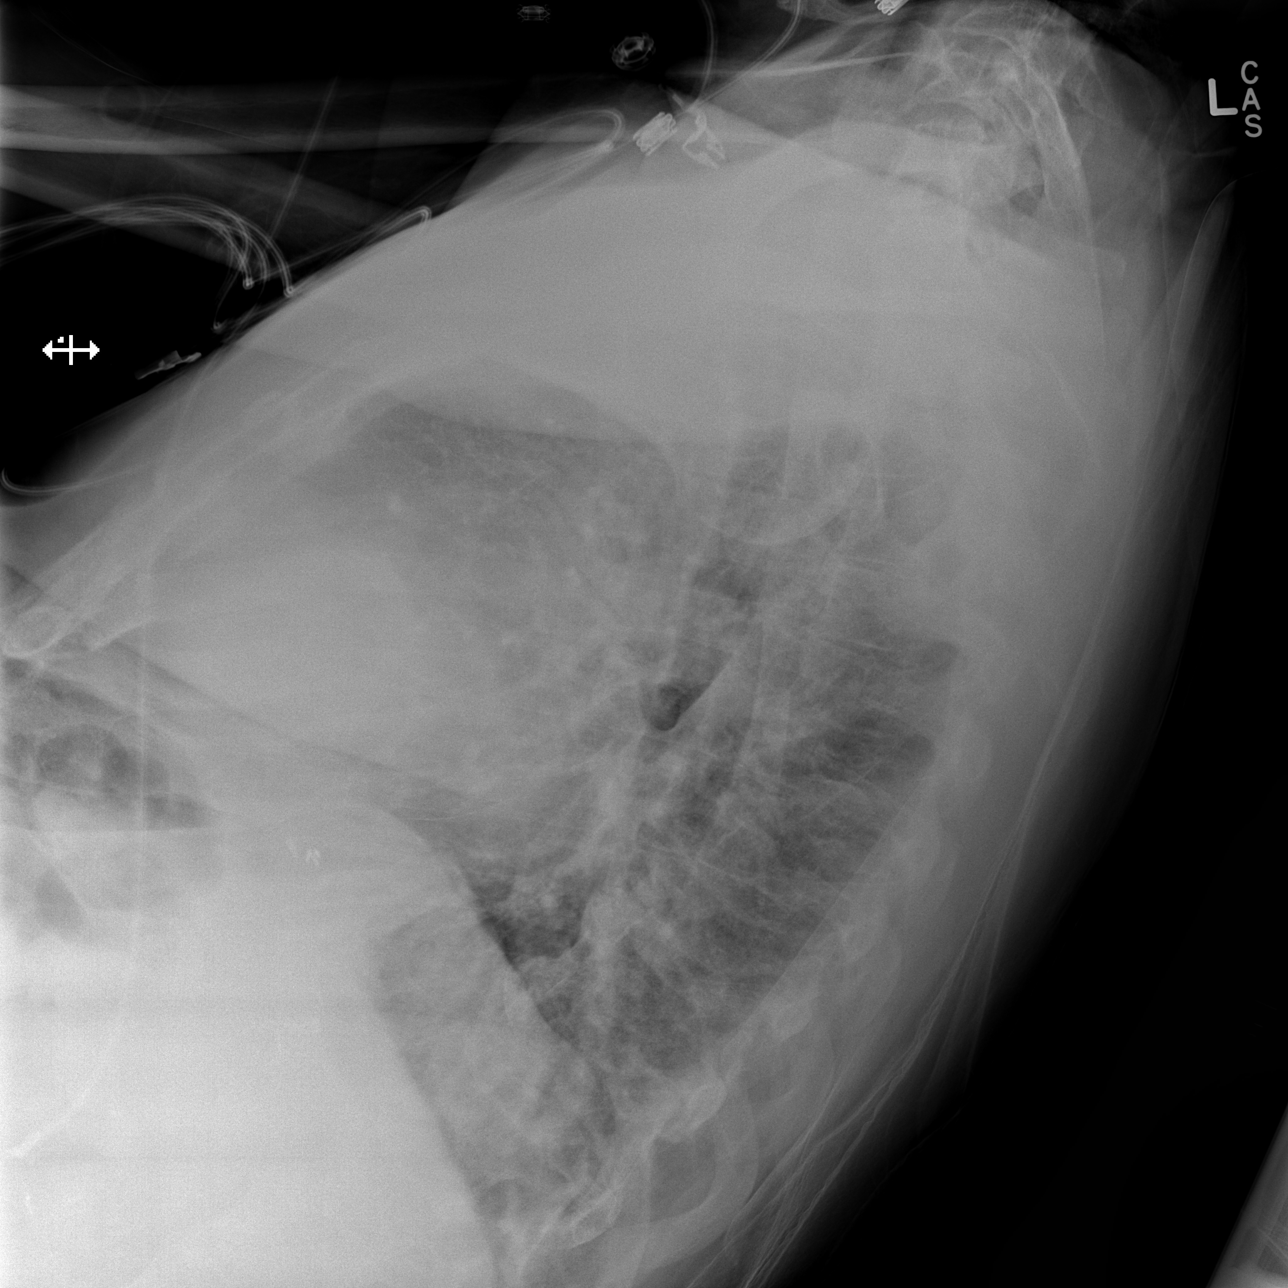

[x chest ap (1 of 2)]
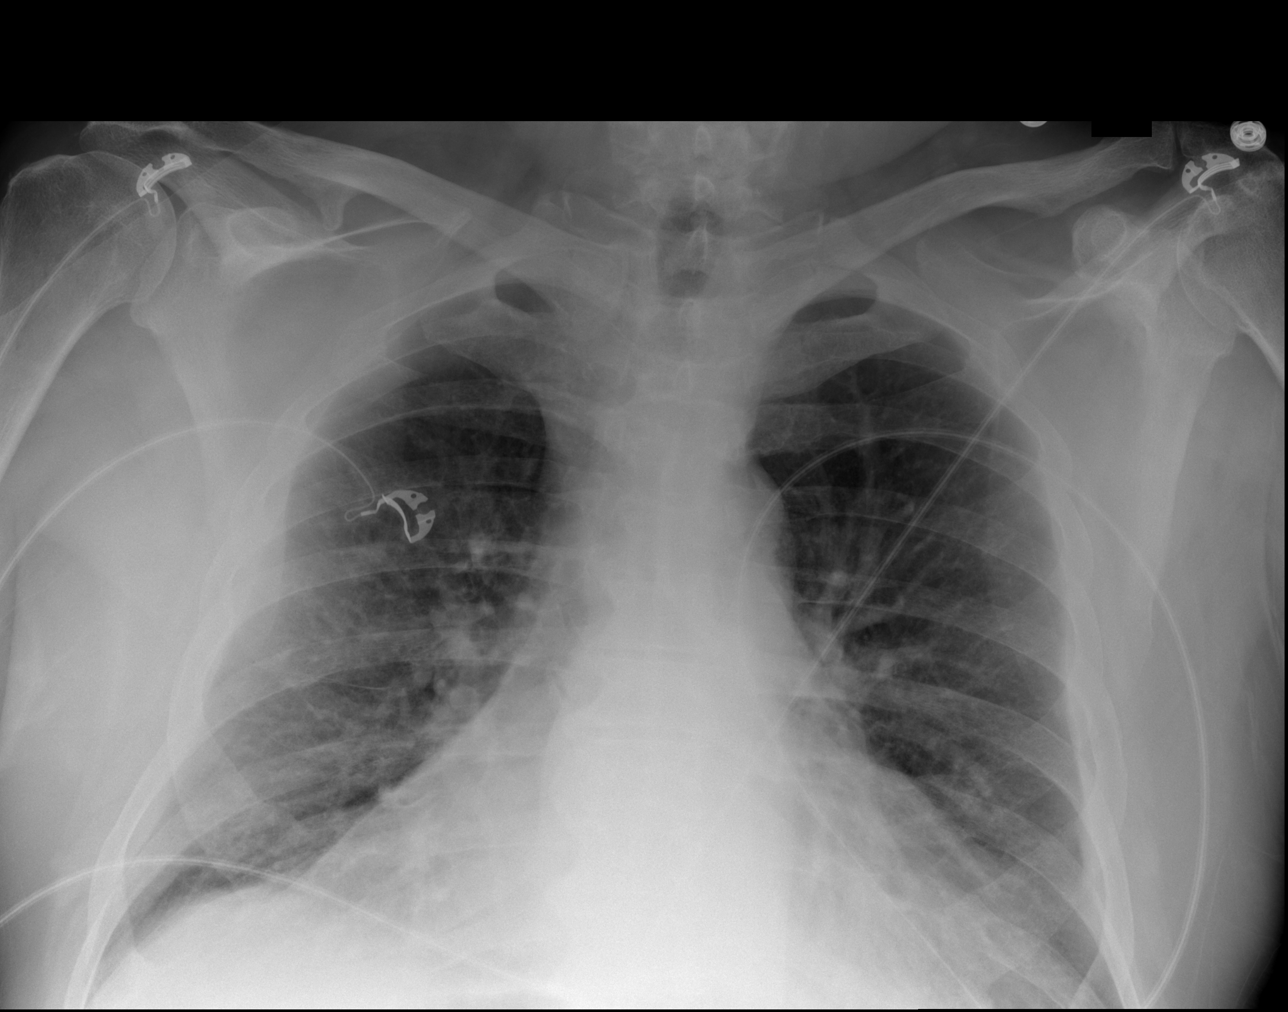

[x chest ap (2 of 2)]
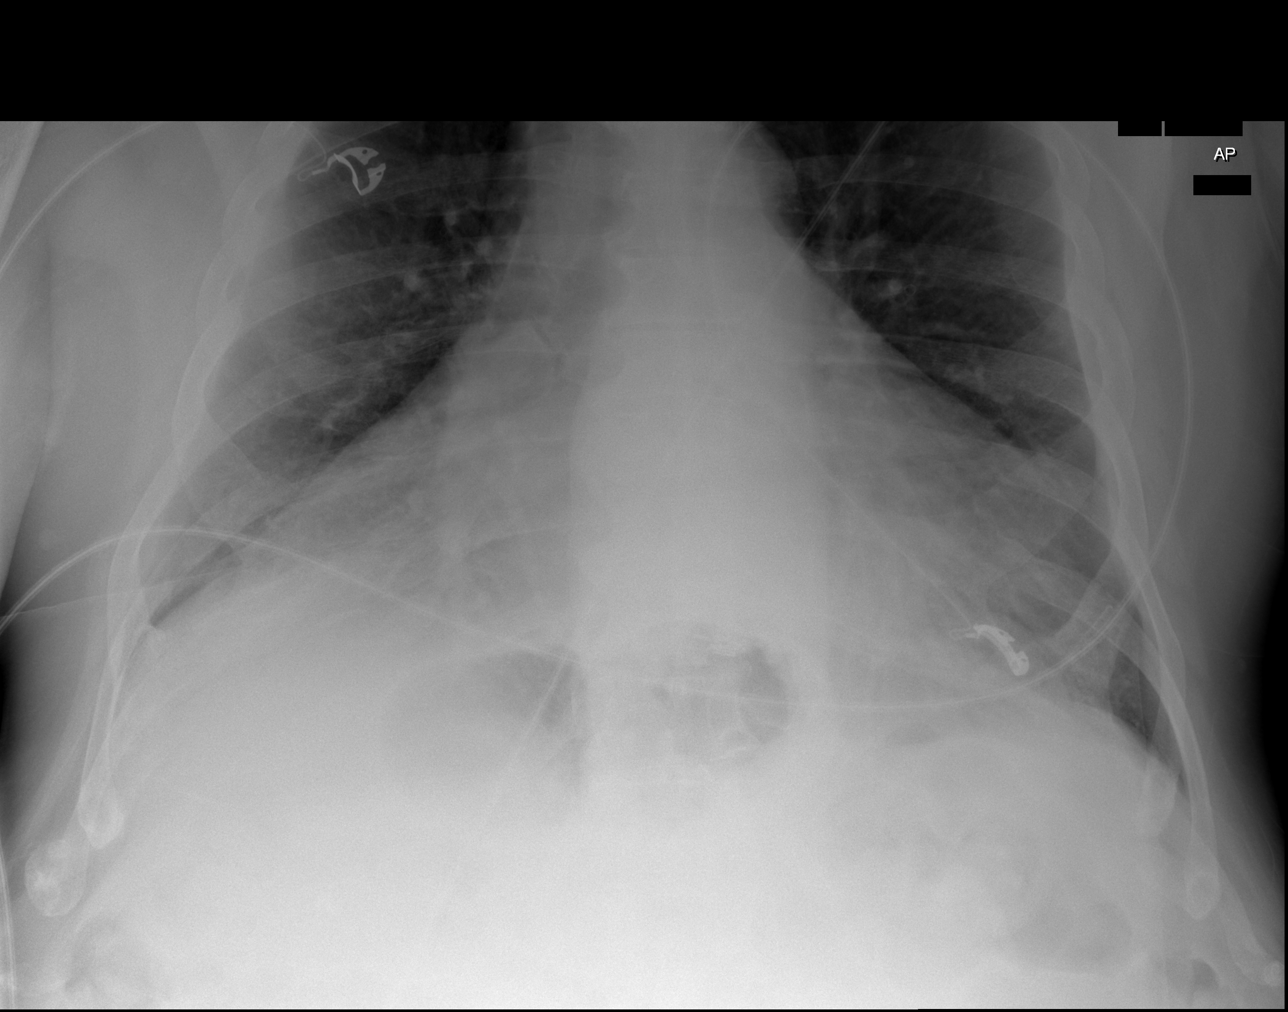

[3 of 3 positions shown; findings below may reference images not displayed]

FINDINGS: cardiomegaly evident with vascular congestion.  Basilar
atelectasis noted.  No definite CHF pattern or pneumonia.  No
significant effusion or pneumothorax.  Trachea is midline.
IMPRESSION: Cardiomegaly with vascular congestion

## 2014-06-16 DIAGNOSIS — F329 Major depressive disorder, single episode, unspecified: Secondary | ICD-10-CM | POA: Diagnosis not present

## 2014-06-18 ENCOUNTER — Emergency Department (HOSPITAL_COMMUNITY): Payer: Medicare Other

## 2014-06-18 ENCOUNTER — Emergency Department (HOSPITAL_COMMUNITY)
Admission: EM | Admit: 2014-06-18 | Discharge: 2014-06-20 | Disposition: A | Discharge status: Home / Self Care | Payer: Medicare Other | Attending: Emergency Medicine | Admitting: Emergency Medicine

## 2014-06-18 ENCOUNTER — Encounter (HOSPITAL_COMMUNITY): Payer: Self-pay | Admitting: Emergency Medicine

## 2014-06-18 DIAGNOSIS — F10239 Alcohol dependence with withdrawal, unspecified: Secondary | ICD-10-CM

## 2014-06-18 DIAGNOSIS — F419 Anxiety disorder, unspecified: Secondary | ICD-10-CM | POA: Insufficient documentation

## 2014-06-18 DIAGNOSIS — Z8701 Personal history of pneumonia (recurrent): Secondary | ICD-10-CM | POA: Diagnosis not present

## 2014-06-18 DIAGNOSIS — Z720 Tobacco use: Secondary | ICD-10-CM | POA: Insufficient documentation

## 2014-06-18 DIAGNOSIS — M1990 Unspecified osteoarthritis, unspecified site: Secondary | ICD-10-CM | POA: Insufficient documentation

## 2014-06-18 DIAGNOSIS — R0602 Shortness of breath: Secondary | ICD-10-CM | POA: Diagnosis not present

## 2014-06-18 DIAGNOSIS — I10 Essential (primary) hypertension: Secondary | ICD-10-CM | POA: Diagnosis not present

## 2014-06-18 DIAGNOSIS — I252 Old myocardial infarction: Secondary | ICD-10-CM | POA: Insufficient documentation

## 2014-06-18 DIAGNOSIS — Z7901 Long term (current) use of anticoagulants: Secondary | ICD-10-CM | POA: Insufficient documentation

## 2014-06-18 DIAGNOSIS — F29 Unspecified psychosis not due to a substance or known physiological condition: Secondary | ICD-10-CM | POA: Diagnosis not present

## 2014-06-18 DIAGNOSIS — J441 Chronic obstructive pulmonary disease with (acute) exacerbation: Secondary | ICD-10-CM | POA: Insufficient documentation

## 2014-06-18 DIAGNOSIS — Z9981 Dependence on supplemental oxygen: Secondary | ICD-10-CM | POA: Diagnosis not present

## 2014-06-18 DIAGNOSIS — Z7982 Long term (current) use of aspirin: Secondary | ICD-10-CM | POA: Diagnosis not present

## 2014-06-18 DIAGNOSIS — G4733 Obstructive sleep apnea (adult) (pediatric): Secondary | ICD-10-CM | POA: Insufficient documentation

## 2014-06-18 DIAGNOSIS — I509 Heart failure, unspecified: Secondary | ICD-10-CM | POA: Diagnosis not present

## 2014-06-18 DIAGNOSIS — Y908 Blood alcohol level of 240 mg/100 ml or more: Secondary | ICD-10-CM | POA: Diagnosis not present

## 2014-06-18 DIAGNOSIS — F329 Major depressive disorder, single episode, unspecified: Secondary | ICD-10-CM | POA: Diagnosis not present

## 2014-06-18 DIAGNOSIS — F17210 Nicotine dependence, cigarettes, uncomplicated: Secondary | ICD-10-CM | POA: Diagnosis not present

## 2014-06-18 DIAGNOSIS — E785 Hyperlipidemia, unspecified: Secondary | ICD-10-CM | POA: Insufficient documentation

## 2014-06-18 DIAGNOSIS — R45851 Suicidal ideations: Secondary | ICD-10-CM | POA: Diagnosis not present

## 2014-06-18 DIAGNOSIS — F1410 Cocaine abuse, uncomplicated: Secondary | ICD-10-CM | POA: Insufficient documentation

## 2014-06-18 DIAGNOSIS — I2510 Atherosclerotic heart disease of native coronary artery without angina pectoris: Secondary | ICD-10-CM | POA: Diagnosis not present

## 2014-06-18 DIAGNOSIS — F10231 Alcohol dependence with withdrawal delirium: Secondary | ICD-10-CM | POA: Diagnosis not present

## 2014-06-18 LAB — LIPASE, BLOOD: Lipase: 17 U/L — ABNORMAL LOW (ref 22–51)

## 2014-06-18 LAB — RAPID URINE DRUG SCREEN, HOSP PERFORMED
Amphetamines: NOT DETECTED
BARBITURATES: NOT DETECTED
BENZODIAZEPINES: NOT DETECTED
COCAINE: POSITIVE — AB
OPIATES: NOT DETECTED
Tetrahydrocannabinol: NOT DETECTED

## 2014-06-18 LAB — CBC WITH DIFFERENTIAL/PLATELET
BASOS ABS: 0.1 10*3/uL (ref 0.0–0.1)
Basophils Relative: 1 % (ref 0–1)
EOS ABS: 0.3 10*3/uL (ref 0.0–0.7)
Eosinophils Relative: 4 % (ref 0–5)
HCT: 50.2 % (ref 39.0–52.0)
Hemoglobin: 16.5 g/dL (ref 13.0–17.0)
LYMPHS ABS: 2.4 10*3/uL (ref 0.7–4.0)
Lymphocytes Relative: 33 % (ref 12–46)
MCH: 31.3 pg (ref 26.0–34.0)
MCHC: 32.9 g/dL (ref 30.0–36.0)
MCV: 95.3 fL (ref 78.0–100.0)
Monocytes Absolute: 0.8 10*3/uL (ref 0.1–1.0)
Monocytes Relative: 11 % (ref 3–12)
Neutro Abs: 3.8 10*3/uL (ref 1.7–7.7)
Neutrophils Relative %: 51 % (ref 43–77)
Platelets: 194 10*3/uL (ref 150–400)
RBC: 5.27 MIL/uL (ref 4.22–5.81)
RDW: 14.7 % (ref 11.5–15.5)
WBC: 7.4 10*3/uL (ref 4.0–10.5)

## 2014-06-18 LAB — COMPREHENSIVE METABOLIC PANEL
ALT: 21 U/L (ref 17–63)
AST: 26 U/L (ref 15–41)
Albumin: 3.7 g/dL (ref 3.5–5.0)
Alkaline Phosphatase: 61 U/L (ref 38–126)
Anion gap: 11 (ref 5–15)
BUN: 13 mg/dL (ref 6–20)
CALCIUM: 9 mg/dL (ref 8.9–10.3)
CO2: 21 mmol/L — ABNORMAL LOW (ref 22–32)
CREATININE: 0.94 mg/dL (ref 0.61–1.24)
Chloride: 104 mmol/L (ref 101–111)
GFR calc Af Amer: >60 mL/min (ref >60)
GFR calc non Af Amer: >60 mL/min (ref >60)
Glucose, Bld: 123 mg/dL — ABNORMAL HIGH (ref 70–99)
Potassium: 3.8 mmol/L (ref 3.5–5.1)
SODIUM: 136 mmol/L (ref 135–145)
TOTAL PROTEIN: 6.8 g/dL (ref 6.5–8.1)
Total Bilirubin: 0.6 mg/dL (ref 0.3–1.2)

## 2014-06-18 LAB — BRAIN NATRIURETIC PEPTIDE: B Natriuretic Peptide: 50.9 pg/mL (ref 0.0–100.0)

## 2014-06-18 LAB — TROPONIN I: Troponin I: <0.03 ng/mL (ref <0.031)

## 2014-06-18 LAB — ETHANOL: Alcohol, Ethyl (B): 316 mg/dL (ref <5)

## 2014-06-18 MED ORDER — ASPIRIN EC 81 MG PO TBEC
81.0000 mg | DELAYED_RELEASE_TABLET | Freq: Every day | ORAL | Status: DC
  Administered 2014-06-19 – 2014-06-20 (×2): 81 mg via ORAL
  Filled 2014-06-18 (×2): qty 1

## 2014-06-18 MED ORDER — TIOTROPIUM BROMIDE MONOHYDRATE 18 MCG IN CAPS
18.0000 ug | ORAL_CAPSULE | Freq: Every day | RESPIRATORY_TRACT | Status: DC
  Administered 2014-06-19 – 2014-06-20 (×2): 18 ug via RESPIRATORY_TRACT
  Filled 2014-06-18: qty 5

## 2014-06-18 MED ORDER — IPRATROPIUM BROMIDE 0.02 % IN SOLN
0.5000 mg | Freq: Once | RESPIRATORY_TRACT | Status: AC
  Administered 2014-06-18: 0.5 mg via RESPIRATORY_TRACT
  Filled 2014-06-18: qty 2.5

## 2014-06-18 MED ORDER — NICOTINE 21 MG/24HR TD PT24
21.0000 mg | MEDICATED_PATCH | Freq: Every day | TRANSDERMAL | Status: DC
  Administered 2014-06-19 – 2014-06-20 (×2): 21 mg via TRANSDERMAL
  Filled 2014-06-18 (×2): qty 1

## 2014-06-18 MED ORDER — SODIUM CHLORIDE 0.9 % IV SOLN
INTRAVENOUS | Status: DC
  Administered 2014-06-18: 20:00:00 via INTRAVENOUS

## 2014-06-18 MED ORDER — PREDNISONE 50 MG PO TABS
50.0000 mg | ORAL_TABLET | Freq: Every day | ORAL | Status: DC
  Administered 2014-06-19 – 2014-06-20 (×2): 50 mg via ORAL
  Filled 2014-06-18 (×2): qty 1

## 2014-06-18 MED ORDER — ALBUTEROL SULFATE HFA 108 (90 BASE) MCG/ACT IN AERS: 2.0000 | INHALATION_SPRAY | Freq: Four times a day (QID) | RESPIRATORY_TRACT | Status: DC | PRN

## 2014-06-18 MED ORDER — MOMETASONE FURO-FORMOTEROL FUM 200-5 MCG/ACT IN AERO
2.0000 | INHALATION_SPRAY | Freq: Two times a day (BID) | RESPIRATORY_TRACT | Status: DC
  Administered 2014-06-19 – 2014-06-20 (×4): 2 via RESPIRATORY_TRACT
  Filled 2014-06-18: qty 8.8

## 2014-06-18 MED ORDER — METHYLPREDNISOLONE SODIUM SUCC 125 MG IJ SOLR
125.0000 mg | Freq: Once | INTRAMUSCULAR | Status: AC
  Administered 2014-06-18: 125 mg via INTRAVENOUS
  Filled 2014-06-18: qty 2

## 2014-06-18 MED ORDER — ALBUTEROL SULFATE (2.5 MG/3ML) 0.083% IN NEBU
5.0000 mg | INHALATION_SOLUTION | Freq: Once | RESPIRATORY_TRACT | Status: AC
  Administered 2014-06-18: 5 mg via RESPIRATORY_TRACT
  Filled 2014-06-18: qty 6

## 2014-06-18 MED ORDER — ALUM & MAG HYDROXIDE-SIMETH 200-200-20 MG/5ML PO SUSP: 30.0000 mL | ORAL | Status: DC | PRN

## 2014-06-18 NOTE — ED Notes (Signed)
Patient frequently reminded by staff that a urine specimen is needed.

## 2014-06-18 NOTE — ED Notes (Signed)
Pt some home he called EMS stating he has drank 1 pint of liquor today and has been smoking crack. He wants to die. C/o chest pain, sob. Wants to get help for his problems. Lives by himself no one to help care for him. Alert to person and place. Has COPD cant breath at times.

## 2014-06-18 NOTE — ED Provider Notes (Signed)
CSN: [id number]     Arrival date & time 06/18/14  1841 History   First MD Initiated Contact with Patient 06/18/14 1850     Chief Complaint  Patient presents with  . Suicidal  . Alcohol Intoxication  . Shortness of Breath  . Addiction Problem     (Consider location/radiation/quality/duration/timing/severity/associated sxs/prior Treatment) HPI Comments: Pt here with alcohol intoxication and sob, admits to recent crack cocaine use--h/o copd and cough has been increasing--pt also endorse SI without plan--drinks daily and hasn't been in rehab recently--denies abd pain or bloody stools--called ems --no further history obtainable  Patient is a 64 y.o. male presenting with intoxication and shortness of breath. The history is provided by the patient. The history is limited by the condition of the patient.  Alcohol Intoxication Associated symptoms include shortness of breath.  Shortness of Breath   Past Medical History  Diagnosis Date  . Depression   . Hypertension   . Hyperlipidemia   . Myocardial infarction 2000  . CHF (congestive heart failure)   . COPD (chronic obstructive pulmonary disease)   . Peripheral vascular disease, unspecified     08/20/10 doppler: increase in right ABI post-op. Left ABI stable. S/P bi-fem bypass surgery  . GERD (gastroesophageal reflux disease)   . Asthma   . Anxiety   . History of DVT of lower extremity   . CAD (coronary artery disease) 05/27/10    Cath: severe single vessell CAD left cx midportion obtuse marginal 2 to 3.  . Shortness of breath   . Pneumonia 04/04/2012  . Arthritis   . OSA on CPAP   . Hyperlipemia   . COPD (chronic obstructive pulmonary disease)   . Alcohol abuse     H/O  . Tobacco abuse   . Orbital fracture 12/2012   Past Surgical History  Procedure Laterality Date  . Femoral bypass  08/19/10    Right Fem-Pop  . Aortogram w/ ptca  09/292003  . Eye surgery    . Back surgery    . Total knee arthroplasty    . Cholecystectomy     . Cardiac catheterization  05/27/10    severe CAD left cx  . Abdoninal ao angio & bifem angio  05/27/10    Patent graft, occluded bil stents with no retrograde flow into the hypogastric arteries. 100% occl left ant. tibial artery, 70% to 80% to 100% stenosis right superficial fem artery above adductor canal. 100 % occl right ant. tibial vessell  . Esophagogastric fundoplication     Family History  Problem Relation Age of Onset  . Heart attack Mother   . Cirrhosis Father   . Heart failure Mother   . Heart failure Brother   . Cancer Brother    History  Substance Use Topics  . Smoking status: Current Every Day Smoker -- 1.00 packs/day    Types: Cigars    Start date: 02/09/1961  . Smokeless tobacco: Never Used     Comment: 2 ppd, full flavor  . Alcohol Use: No     Comment: occ    Review of Systems  Unable to perform ROS Respiratory: Positive for shortness of breath.       Allergies  Darvocet and Haldol  Home Medications   Prior to Admission medications   Medication Sig Start Date End Date Taking? Authorizing Provider  albuterol (PROVENTIL HFA;VENTOLIN HFA) 108 (90 BASE) MCG/ACT inhaler Inhale 2 puffs into the lungs every 6 (six) hours as needed for wheezing or shortness of breath. 11/21/13  Coral Spikes, DO  amLODipine (NORVASC) 5 MG tablet Take 1 tablet (5 mg total) by mouth daily. 04/12/13   Leone Brand, MD  aspirin EC 81 MG tablet Take 1 tablet (81 mg total) by mouth daily. Patient not taking: Reported on 02/21/2014 07/26/13   Timmothy Euler, MD  atorvastatin (LIPITOR) 40 MG tablet Take 1 tablet (40 mg total) by mouth daily. 10/02/13   Timmothy Euler, MD  clotrimazole-betamethasone (LOTRISONE) cream Apply 1 application topically 2 (two) times daily. 02/21/14   Timmothy Euler, MD  furosemide (LASIX) 40 MG tablet Take 1 tablet (40 mg total) by mouth daily. 02/21/14   Timmothy Euler, MD  lisinopril (PRINIVIL,ZESTRIL) 20 MG tablet Take 0.5 tablets (10 mg total) by  mouth daily. 02/23/14   Hilton Sinclair, MD  mometasone-formoterol (DULERA) 200-5 MCG/ACT AERO Inhale 2 puffs into the lungs 2 (two) times daily. 11/21/13   Coral Spikes, DO  nortriptyline (PAMELOR) 25 MG capsule Take 1 capsule (25 mg total) by mouth at bedtime. 08/03/13   Zenia Resides, MD  omeprazole (PRILOSEC) 20 MG capsule Take 1 capsule (20 mg total) by mouth daily. 02/21/14   Timmothy Euler, MD  oxyCODONE-acetaminophen (PERCOCET) 5-325 MG per tablet Take 2 tablets by mouth every 6 (six) hours as needed for moderate pain. 06/05/13   Wandra Arthurs, MD  potassium chloride (K-DUR) 10 MEQ tablet Take 2 tablets (20 mEq total) by mouth daily. 11/21/13   Coral Spikes, DO  predniSONE (DELTASONE) 50 MG tablet Take 1 tablet (50 mg total) by mouth daily. For 5 days. Patient not taking: Reported on 02/21/2014 11/23/13   Coral Spikes, DO  Rivaroxaban (XARELTO STARTER PACK) 15 & 20 MG TBPK '15mg'$  tablet by mouth twice a day with food change to 20 mg tab once daily on day 22. 02/22/14   Timmothy Euler, MD  Spacer/Aero-Holding Chambers (BREATHERITE COLL SPACER ADULT) MISC 1 Device by Does not apply route as needed (with inhaler). 07/26/13   Timmothy Euler, MD  tiotropium (SPIRIVA) 18 MCG inhalation capsule Place 1 capsule (18 mcg total) into inhaler and inhale daily. 11/21/13   Jayce G Cook, DO   BP 96/55 mmHg  Pulse 92  Temp(Src) 97.3 F (36.3 C) (Oral)  Resp 34  SpO2 92% Physical Exam  Constitutional: He is oriented to person, place, and time. He appears well-developed and well-nourished.  Non-toxic appearance. No distress.  HENT:  Head: Normocephalic and atraumatic.  Eyes: Conjunctivae, EOM and lids are normal. Pupils are equal, round, and reactive to light.  Neck: Normal range of motion. Neck supple. No tracheal deviation present. No thyroid mass present.  Cardiovascular: Normal rate, regular rhythm and normal heart sounds.  Exam reveals no gallop.   No murmur heard. Pulmonary/Chest: Effort  normal. No stridor. No respiratory distress. He has decreased breath sounds. He has wheezes. He has no rhonchi. He has no rales.  Abdominal: Soft. Normal appearance and bowel sounds are normal. He exhibits no distension. There is no tenderness. There is no rebound and no CVA tenderness.  Musculoskeletal: Normal range of motion. He exhibits no edema or tenderness.  Neurological: He is alert and oriented to person, place, and time. He has normal strength. No cranial nerve deficit or sensory deficit. GCS eye subscore is 4. GCS verbal subscore is 5. GCS motor subscore is 6.  Skin: Skin is warm and dry. No abrasion and no rash noted.  Psychiatric: His mood appears anxious. His  speech is slurred. He is slowed. He expresses suicidal ideation.  Nursing note and vitals reviewed.   ED Course  Procedures (including critical care time) Labs Review Labs Reviewed  CBC WITH DIFFERENTIAL/PLATELET  COMPREHENSIVE METABOLIC PANEL  LIPASE, BLOOD  ETHANOL  URINE RAPID DRUG SCREEN (HOSP PERFORMED)    Imaging Review No results found.   EKG Interpretation   Date/Time:  Monday Jun 18 2014 19:00:25 EDT Ventricular Rate:  89 PR Interval:  165 QRS Duration: 97 QT Interval:  399 QTC Calculation: 485 R Axis:   43 Text Interpretation:  Sinus rhythm Borderline prolonged QT interval No  significant change since last tracing Confirmed by Illiana Losurdo  MD, Zaviyar Rahal  ([id number]) on 06/18/2014 7:14:40 PM      MDM   Final diagnoses:  SOB (shortness of breath)    Patient given albuterol along with Solu-Medrol for treatment of his COPD. Reassessed and more alert now alcohol level noted. Continues to endorse suicidal ideations. Will have patient seen by behavior health    Lacretia Leigh, MD 06/18/14 [phone]

## 2014-06-18 NOTE — ED Notes (Signed)
Pt stated he is unable to urinate. Explained to pt how important it is to give [place] a urine sample

## 2014-06-18 NOTE — ED Notes (Signed)
Patient denies needs at present time.  Speech is slurred, difficult to understand.

## 2014-06-19 DIAGNOSIS — F10239 Alcohol dependence with withdrawal, unspecified: Secondary | ICD-10-CM | POA: Diagnosis not present

## 2014-06-19 DIAGNOSIS — J441 Chronic obstructive pulmonary disease with (acute) exacerbation: Secondary | ICD-10-CM | POA: Diagnosis not present

## 2014-06-19 HISTORY — DX: Alcohol dependence with withdrawal, unspecified: F10.239

## 2014-06-19 MED ORDER — MENTHOL 3 MG MT LOZG
1.0000 | LOZENGE | OROMUCOSAL | Status: DC | PRN
  Administered 2014-06-19: 3 mg via ORAL
  Filled 2014-06-19: qty 9

## 2014-06-19 MED ORDER — LORAZEPAM 1 MG PO TABS
0.0000 mg | ORAL_TABLET | Freq: Two times a day (BID) | ORAL | Status: DC
  Filled 2014-06-19: qty 1

## 2014-06-19 MED ORDER — LORAZEPAM 1 MG PO TABS
0.0000 mg | ORAL_TABLET | Freq: Four times a day (QID) | ORAL | Status: DC
  Administered 2014-06-19: 1 mg via ORAL
  Administered 2014-06-19 – 2014-06-20 (×2): 2 mg via ORAL
  Filled 2014-06-19: qty 1
  Filled 2014-06-19 (×2): qty 2
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
## 2014-06-19 NOTE — ED Notes (Signed)
Pt denies SI/HI; pt reports "I couldn't hurt myself or anyone, I am as weak as a fly."

## 2014-06-19 NOTE — Consult Note (Signed)
[facility] Face-to-Face Psychiatry Consult   Reason for Consult:  Chest pain, alcohol detox Referring Physician:  EDP Patient Identification: [name] MRN:  [id number] Principal Diagnosis: Alcohol dependence with withdrawal with complication Diagnosis:   Patient Active Problem List   Diagnosis Date Noted  . Alcohol dependence with withdrawal with complication [F10.239] 06/19/2014    Priority: High  . DVT (deep venous thrombosis) [I82.409] 02/22/2014  . Lower extremity edema [R60.0] 02/21/2014  . Diastolic CHF, acute on chronic [I50.33] 11/21/2013  . CAD (coronary artery disease) [I25.10] 11/21/2013  . Alcohol abuse [F10.10] 11/21/2013  . GERD (gastroesophageal reflux disease) [K21.9] 08/10/2013  . Gout [M10.9] 08/10/2013  . Tobacco abuse [Z72.0] 07/26/2013  . Dizziness [R42] 04/10/2013  . Binocular vision disorder with diplopia [H53.2] 01/03/2013  . Fracture of inferior orbital wall [S02.3XXA] 01/03/2013  . Peripheral vascular disease [I73.9] 07/27/2012  . COPD (chronic obstructive pulmonary disease) [J44.9] 04/04/2012  . HTN (hypertension) [I10] 04/04/2012    Total Time spent with patient: 45 minutes  Subjective:   [name] is a 64 y.o. male patient admitted with alcohol detox when medically cleared.  HPI:  The patient has been drinking heavily since 2012, after a two year sobriety.  Yesterday, he drank a fifth of vodka in an hour and a half along with some cocaine.  Denies suicidal/homicidal ideations, hallucinations.  Medically unstable at this time due to COPD and other medical issues requiring oxygen. HPI Elements:   Location:  Generalized. Quality:  chronic/severe. Severity:  severe. Timing:  constant. Duration:  years. Context:  stressors.  Past Medical History:  Past Medical History  Diagnosis Date  . Depression   . Hypertension   . Hyperlipidemia   . Myocardial infarction 2000  . CHF (congestive heart failure)   . COPD (chronic obstructive pulmonary  disease)   . Peripheral vascular disease, unspecified     08/20/10 doppler: increase in right ABI post-op. Left ABI stable. S/P bi-fem bypass surgery  . GERD (gastroesophageal reflux disease)   . Asthma   . Anxiety   . History of DVT of lower extremity   . CAD (coronary artery disease) 05/27/10    Cath: severe single vessell CAD left cx midportion obtuse marginal 2 to 3.  . Shortness of breath   . Pneumonia 04/04/2012  . Arthritis   . OSA on CPAP   . Hyperlipemia   . COPD (chronic obstructive pulmonary disease)   . Alcohol abuse     H/O  . Tobacco abuse   . Orbital fracture 12/2012    Past Surgical History  Procedure Laterality Date  . Femoral bypass  08/19/10    Right Fem-Pop  . Aortogram w/ ptca  09/292003  . Eye surgery    . Back surgery    . Total knee arthroplasty    . Cholecystectomy    . Cardiac catheterization  05/27/10    severe CAD left cx  . Abdoninal ao angio & bifem angio  05/27/10    Patent graft, occluded bil stents with no retrograde flow into the hypogastric arteries. 100% occl left ant. tibial artery, 70% to 80% to 100% stenosis right superficial fem artery above adductor canal. 100 % occl right ant. tibial vessell  . Esophagogastric fundoplication     Family History:  Family History  Problem Relation Age of Onset  . Heart attack Mother   . Cirrhosis Father   . Heart failure Mother   . Heart failure Brother   . Cancer Brother  Social History:  History  Alcohol Use  . Yes    Comment: occ     History  Drug Use No    History   Social History  . Marital Status: Single    Spouse Name: N/A  . Number of Children: N/A  . Years of Education: N/A   Social History Main Topics  . Smoking status: Current Every Day Smoker -- 1.00 packs/day    Types: Cigars    Start date: 02/09/1961  . Smokeless tobacco: Never Used     Comment: 2 ppd, full flavor  . Alcohol Use: Yes     Comment: occ  . Drug Use: No  . Sexual Activity: Yes   Other Topics Concern   . None   Social History Narrative   Recovering alcoholic   Additional Social History:    Pain Medications: None Prescriptions: Prilosec & Lasics (out of both of them) Over the Counter: None History of alcohol / drug use?: Yes Withdrawal Symptoms: Sweats, Tremors, Weakness, Fever / Chills, Patient aware of relationship between substance abuse and physical/medical complications, Blackouts, Nausea / Vomiting Name of Substance 1: ETOH (Liquor & beer) 1 - Age of First Use: "Around 64 years old." 1 - Amount (size/oz): Amount varies according to how much money he has. 1 - Frequency: Varies (4 times per week at least) 1 - Duration: On-going 1 - Last Use / Amount: 05/09 "I drank a 5th today I know." Name of Substance 2: Cocaine 2 - Age of First Use: "In my 21's" 2 - Amount (size/oz): Amount varies according to money  2 - Frequency: Varies 2 - Duration: On-going 2 - Last Use / Amount: 05/09 $20 worth                 Allergies:   Allergies  Allergen Reactions  . Darvocet [Propoxyphene N-Acetaminophen] Hives  . Haldol [Haloperidol Decanoate] Hives    Labs:  Results for orders placed or performed during the hospital encounter of 06/18/14 (from the past 48 hour(s))  CBC with Differential/Platelet     Status: None   Collection Time: 06/18/14  7:18 PM  Result Value Ref Range   WBC 7.4 4.0 - 10.5 K/uL   RBC 5.27 4.22 - 5.81 MIL/uL   Hemoglobin 16.5 13.0 - 17.0 g/dL   HCT 50.2 39.0 - 52.0 %   MCV 95.3 78.0 - 100.0 fL   MCH 31.3 26.0 - 34.0 pg   MCHC 32.9 30.0 - 36.0 g/dL   RDW 14.7 11.5 - 15.5 %   Platelets 194 150 - 400 K/uL   Neutrophils Relative % 51 43 - 77 %   Neutro Abs 3.8 1.7 - 7.7 K/uL   Lymphocytes Relative 33 12 - 46 %   Lymphs Abs 2.4 0.7 - 4.0 K/uL   Monocytes Relative 11 3 - 12 %   Monocytes Absolute 0.8 0.1 - 1.0 K/uL   Eosinophils Relative 4 0 - 5 %   Eosinophils Absolute 0.3 0.0 - 0.7 K/uL   Basophils Relative 1 0 - 1 %   Basophils Absolute 0.1 0.0 - 0.1  K/uL  Comprehensive metabolic panel     Status: Abnormal   Collection Time: 06/18/14  7:18 PM  Result Value Ref Range   Sodium 136 135 - 145 mmol/L   Potassium 3.8 3.5 - 5.1 mmol/L   Chloride 104 101 - 111 mmol/L   CO2 21 (L) 22 - 32 mmol/L   Glucose, Bld 123 (H) 70 - 99 mg/dL  BUN 13 6 - 20 mg/dL   Creatinine, Ser 0.94 0.61 - 1.24 mg/dL   Calcium 9.0 8.9 - 10.3 mg/dL   Total Protein 6.8 6.5 - 8.1 g/dL   Albumin 3.7 3.5 - 5.0 g/dL   AST 26 15 - 41 U/L   ALT 21 17 - 63 U/L   Alkaline Phosphatase 61 38 - 126 U/L   Total Bilirubin 0.6 0.3 - 1.2 mg/dL   GFR calc non Af Amer >60 >60 mL/min   GFR calc Af Amer >60 >60 mL/min    Comment: (NOTE) The eGFR has been calculated using the CKD EPI equation. This calculation has not been validated in all clinical situations. eGFR's persistently <60 mL/min signify possible Chronic Kidney Disease.    Anion gap 11 5 - 15  Lipase, blood     Status: Abnormal   Collection Time: 06/18/14  7:18 PM  Result Value Ref Range   Lipase 17 (L) 22 - 51 U/L  Ethanol     Status: Abnormal   Collection Time: 06/18/14  7:18 PM  Result Value Ref Range   Alcohol, Ethyl (B) 316 (HH) <5 mg/dL    Comment:        LOWEST DETECTABLE LIMIT FOR SERUM ALCOHOL IS 11 mg/dL FOR MEDICAL PURPOSES ONLY RESULT REPEATED AND VERIFIED CRITICAL RESULT CALLED TO, READ BACK BY AND VERIFIED WITH: TEUP AT 1951 ON 06/18/14 BY S.VANHOORNE   Brain natriuretic peptide     Status: None   Collection Time: 06/18/14  7:18 PM  Result Value Ref Range   B Natriuretic Peptide 50.9 0.0 - 100.0 pg/mL  Troponin I     Status: None   Collection Time: 06/18/14  7:18 PM  Result Value Ref Range   Troponin I <0.03 <0.031 ng/mL    Comment:        NO INDICATION OF MYOCARDIAL INJURY.   Urine rapid drug screen (hosp performed)     Status: Abnormal   Collection Time: 06/18/14 10:26 PM  Result Value Ref Range   Opiates NONE DETECTED NONE DETECTED   Cocaine POSITIVE (A) NONE DETECTED    Benzodiazepines NONE DETECTED NONE DETECTED   Amphetamines NONE DETECTED NONE DETECTED   Tetrahydrocannabinol NONE DETECTED NONE DETECTED   Barbiturates NONE DETECTED NONE DETECTED    Comment:        DRUG SCREEN FOR MEDICAL PURPOSES ONLY.  IF CONFIRMATION IS NEEDED FOR ANY PURPOSE, NOTIFY LAB WITHIN 5 DAYS.        LOWEST DETECTABLE LIMITS FOR URINE DRUG SCREEN Drug Class       Cutoff (ng/mL) Amphetamine      1000 Barbiturate      200 Benzodiazepine   814 Tricyclics       481 Opiates          300 Cocaine          300 THC              50     Vitals: Blood pressure 189/80, pulse 101, temperature 98.8 F (37.1 C), temperature source Oral, resp. rate 26, SpO2 96 %.  Risk to Self: Suicidal Ideation: Yes-Currently Present Suicidal Intent: No-Not Currently/Within Last 6 Months Is patient at risk for suicide?: Yes Suicidal Plan?: Yes-Currently Present Specify Current Suicidal Plan: Pt mentioned shooting himself.   Access to Means: No What has been your use of drugs/alcohol within the last 12 months?: ETOH & crack cocaine How many times?:  ("Right many"  ) Other Self Harm Risks: None  Triggers for Past Attempts: Unpredictable Intentional Self Injurious Behavior: None Risk to Others: Homicidal Ideation: No-Not Currently/Within Last 6 Months Thoughts of Harm to Others: No-Not Currently Present/Within Last 6 Months Current Homicidal Intent: No Current Homicidal Plan: No Access to Homicidal Means: No Identified Victim: No one History of harm to others?: Yes Assessment of Violence: In distant past Violent Behavior Description: None reported now. Does patient have access to weapons?: No Criminal Charges Pending?: No Does patient have a court date: No Prior Inpatient Therapy: Prior Inpatient Therapy: Yes Prior Therapy Dates: Cannot remember when Prior Therapy Facilty/Provider(s): [facility] Reason for Treatment: SI Prior Outpatient Therapy: Prior Outpatient Therapy: No Prior Therapy  Dates: None Prior Therapy Facilty/Provider(s): None Reason for Treatment: N/A Does patient have an ACCT team?: No Does patient have Intensive In-House Services?  : No Does patient have [facility] services? : No Does patient have P4CC services?: No  Current Facility-Administered Medications  Medication Dose Route Frequency Provider Last Rate Last Dose  . 0.9 %  sodium chloride infusion   Intravenous Continuous Lacretia Leigh, MD   Stopped at 06/18/14 2031  . albuterol (PROVENTIL HFA;VENTOLIN HFA) 108 (90 BASE) MCG/ACT inhaler 2 puff  2 puff Inhalation Q6H PRN Lacretia Leigh, MD      . alum & mag hydroxide-simeth (MAALOX/MYLANTA) 200-200-20 MG/5ML suspension 30 mL  30 mL Oral PRN Lacretia Leigh, MD      . aspirin EC tablet 81 mg  81 mg Oral Daily Lacretia Leigh, MD   81 mg at 06/19/14 0912  . LORazepam (ATIVAN) tablet 0-4 mg  0-4 mg Oral 4 times per day Lacretia Leigh, MD   1 mg at 06/19/14 3762   Followed by  . [START ON 06/21/2014] LORazepam (ATIVAN) tablet 0-4 mg  0-4 mg Oral Q12H Lacretia Leigh, MD      . menthol-cetylpyridinium (CEPACOL) lozenge 3 mg  1 lozenge Oral PRN Linton Flemings, MD   3 mg at 06/19/14 0630  . mometasone-formoterol (DULERA) 200-5 MCG/ACT inhaler 2 puff  2 puff Inhalation BID Lacretia Leigh, MD   2 puff at 06/19/14 [phone]  . nicotine (NICODERM CQ - dosed in mg/24 hours) patch 21 mg  21 mg Transdermal Daily Lacretia Leigh, MD   21 mg at 06/19/14 0912  . predniSONE (DELTASONE) tablet 50 mg  50 mg Oral Daily Lacretia Leigh, MD   50 mg at 06/19/14 0912  . tiotropium (SPIRIVA) inhalation capsule 18 mcg  18 mcg Inhalation Daily Lacretia Leigh, MD   18 mcg at 06/19/14 1761   Current Outpatient Prescriptions  Medication Sig Dispense Refill  . clotrimazole-betamethasone (LOTRISONE) cream Apply 1 application topically 2 (two) times daily. 30 g 0  . furosemide (LASIX) 40 MG tablet Take 1 tablet (40 mg total) by mouth daily. 30 tablet 3  . Multiple Vitamins-Minerals (MULTIVITAMIN ADULT PO) Take 1  tablet by mouth daily.    [name] omeprazole (PRILOSEC) 20 MG capsule Take 1 capsule (20 mg total) by mouth daily. 30 capsule 11  . Pyridoxine HCl (VITAMIN B-6 PO) Take 1 tablet by mouth daily.    . vitamin B-12 (CYANOCOBALAMIN) 100 MCG tablet Take 100 mcg by mouth daily.    . vitamin E 200 UNIT capsule Take 200 Units by mouth daily.    [name] albuterol (PROVENTIL HFA;VENTOLIN HFA) 108 (90 BASE) MCG/ACT inhaler Inhale 2 puffs into the lungs every 6 (six) hours as needed for wheezing or shortness of breath. (Patient not taking: Reported on 06/18/2014) 1 Inhaler 3  . amLODipine (NORVASC) 5  MG tablet Take 1 tablet (5 mg total) by mouth daily. (Patient not taking: Reported on 06/18/2014) 30 tablet 0  . aspirin EC 81 MG tablet Take 1 tablet (81 mg total) by mouth daily. (Patient not taking: Reported on 02/21/2014) 90 tablet 3  . atorvastatin (LIPITOR) 40 MG tablet Take 1 tablet (40 mg total) by mouth daily. (Patient not taking: Reported on 06/18/2014) 90 tablet 3  . lisinopril (PRINIVIL,ZESTRIL) 20 MG tablet Take 0.5 tablets (10 mg total) by mouth daily. (Patient not taking: Reported on 06/18/2014) 90 tablet 3  . mometasone-formoterol (DULERA) 200-5 MCG/ACT AERO Inhale 2 puffs into the lungs 2 (two) times daily. (Patient not taking: Reported on 06/18/2014) 13 g 3  . nortriptyline (PAMELOR) 25 MG capsule Take 1 capsule (25 mg total) by mouth at bedtime. (Patient not taking: Reported on 06/18/2014) 30 capsule 1  . oxyCODONE-acetaminophen (PERCOCET) 5-325 MG per tablet Take 2 tablets by mouth every 6 (six) hours as needed for moderate pain. (Patient not taking: Reported on 06/18/2014) 12 tablet 0  . potassium chloride (K-DUR) 10 MEQ tablet Take 2 tablets (20 mEq total) by mouth daily. (Patient not taking: Reported on 06/18/2014) 30 tablet 0  . predniSONE (DELTASONE) 50 MG tablet Take 1 tablet (50 mg total) by mouth daily. For 5 days. (Patient not taking: Reported on 02/21/2014) 5 tablet 0  . Rivaroxaban (XARELTO STARTER PACK) 15 & 20 MG  TBPK $Rem'15mg'VPLq$  tablet by mouth twice a day with food change to 20 mg tab once daily on day 22. (Patient not taking: Reported on 06/18/2014) 51 each 0  . Spacer/Aero-Holding Chambers (BREATHERITE COLL SPACER ADULT) MISC 1 Device by Does not apply route as needed (with inhaler). (Patient not taking: Reported on 06/18/2014) 1 each 0  . tiotropium (SPIRIVA) 18 MCG inhalation capsule Place 1 capsule (18 mcg total) into inhaler and inhale daily. (Patient not taking: Reported on 06/18/2014) 30 capsule 3    Musculoskeletal: Strength & Muscle Tone: decreased Gait & Station: normal Patient leans: N/A  Psychiatric Specialty Exam:     Blood pressure 189/80, pulse 101, temperature 98.8 F (37.1 C), temperature source Oral, resp. rate 26, SpO2 96 %.There is no weight on file to calculate BMI.  General Appearance: Disheveled  Eye Sport and exercise psychologist::  Fair  Speech:  Normal Rate  Volume:  Normal  Mood:  Anxious  Affect:  Congruent  Thought Process:  Coherent  Orientation:  Full (Time, Place, and Person)  Thought Content:  WDL  Suicidal Thoughts:  No  Homicidal Thoughts:  No  Memory:  Immediate;   Fair Recent;   Fair Remote;   Fair  Judgement:  Poor  Insight:  Fair  Psychomotor Activity:  Decreased  Concentration:  Fair  Recall:  [employer] of Knowledge:Fair  Language: Good  Akathisia:  No  Handed:  Right  AIMS (if indicated):     Assets:  Housing Leisure Time Resilience  ADL's:  Intact  Cognition: WNL  Sleep:      Medical Decision Making: Review of Psycho-Social Stressors (1), Review or order clinical lab tests (1) and Review of Medication Regimen & Side Effects (2)  Treatment Plan Summary: Daily contact with patient to assess and evaluate symptoms and progress in treatment, Medication management and Plan await medical clearance  Plan:  Supportive therapy provided about ongoing stressors. Disposition: Awaiting medical clearance  Waylan Boga, PMH-NP 06/19/2014 4:29 PM Patient seen face-to-face for  psychiatric evaluation, chart reviewed and case discussed with the physician extender and developed treatment  plan. Reviewed the information documented and agree with the treatment plan. Corena Pilgrim, MD

## 2014-06-19 NOTE — ED Notes (Signed)
Bed: YT11 Expected date:  Expected time:  Means of arrival:  Comments: Hold for 19

## 2014-06-19 NOTE — ED Notes (Signed)
Pt is sleeping at present time.

## 2014-06-19 NOTE — ED Notes (Signed)
Beverely Low, TTS, at bedside speaking with patient.

## 2014-06-19 NOTE — ED Notes (Signed)
TTS at bedside. 

## 2014-06-20 DIAGNOSIS — J441 Chronic obstructive pulmonary disease with (acute) exacerbation: Secondary | ICD-10-CM | POA: Diagnosis not present

## 2014-06-20 DIAGNOSIS — F10239 Alcohol dependence with withdrawal, unspecified: Secondary | ICD-10-CM | POA: Diagnosis not present

## 2014-06-20 NOTE — Consult Note (Signed)
[facility] Consult   Reason for Consult:  Chest pain, alcohol detox Referring Physician:  EDP Patient Identification: JEFERSON BOOZER MRN:  [id number] Principal Diagnosis: Alcohol dependence with withdrawal with complication Diagnosis:   Patient Active Problem List   Diagnosis Date Noted  . Alcohol dependence with withdrawal with complication [E31.540] 08/67/6195    Priority: High  . DVT (deep venous thrombosis) [I82.409] 02/22/2014  . Lower extremity edema [R60.0] 02/21/2014  . Diastolic CHF, acute on chronic [I50.33] 11/21/2013  . CAD (coronary artery disease) [I25.10] 11/21/2013  . Alcohol abuse [F10.10] 11/21/2013  . GERD (gastroesophageal reflux disease) [K21.9] 08/10/2013  . Gout [M10.9] 08/10/2013  . Tobacco abuse [Z72.0] 07/26/2013  . Dizziness [R42] 04/10/2013  . Binocular vision disorder with diplopia [H53.2] 01/03/2013  . Fracture of inferior orbital wall [S02.3XXA] 01/03/2013  . Peripheral vascular disease [I73.9] 07/27/2012  . COPD (chronic obstructive pulmonary disease) [J44.9] 04/04/2012  . HTN (hypertension) [I10] 04/04/2012    Total Time spent with patient: 30 minutes  Subjective:   UGONNA KEIRSEY is a 64 y.o. male patient has stabilized.  HPI:  The patient has stabilized medically and does not want alcohol detox/rehab.  He wants to go home and plans on avoiding cocaine/crack which caused him to have chest pain and come to the ED.  Layten is also planning to refrain from alcohol use.  He denies suicidal/homicidal ideations, hallucinations.  Stable for discharge. HPI Elements:   Location:  Generalized. Quality:  chronic/severe. Severity:  severe. Timing:  constant. Duration:  years. Context:  stressors.  Past Medical History:  Past Medical History  Diagnosis Date  . Depression   . Hypertension   . Hyperlipidemia   . Myocardial infarction 2000  . CHF (congestive heart failure)   . COPD (chronic obstructive pulmonary disease)   .  Peripheral vascular disease, unspecified     08/20/10 doppler: increase in right ABI post-op. Left ABI stable. S/P bi-fem bypass surgery  . GERD (gastroesophageal reflux disease)   . Asthma   . Anxiety   . History of DVT of lower extremity   . CAD (coronary artery disease) 05/27/10    Cath: severe single vessell CAD left cx midportion obtuse marginal 2 to 3.  . Shortness of breath   . Pneumonia 04/04/2012  . Arthritis   . OSA on CPAP   . Hyperlipemia   . COPD (chronic obstructive pulmonary disease)   . Alcohol abuse     H/O  . Tobacco abuse   . Orbital fracture 12/2012    Past Surgical History  Procedure Laterality Date  . Femoral bypass  08/19/10    Right Fem-Pop  . Aortogram w/ ptca  09/292003  . Eye surgery    . Back surgery    . Total knee arthroplasty    . Cholecystectomy    . Cardiac catheterization  05/27/10    severe CAD left cx  . Abdoninal ao angio & bifem angio  05/27/10    Patent graft, occluded bil stents with no retrograde flow into the hypogastric arteries. 100% occl left ant. tibial artery, 70% to 80% to 100% stenosis right superficial fem artery above adductor canal. 100 % occl right ant. tibial vessell  . Esophagogastric fundoplication     Family History:  Family History  Problem Relation Age of Onset  . Heart attack Mother   . Cirrhosis Father   . Heart failure Mother   . Heart failure Brother   . Cancer Brother    Social  History:  History  Alcohol Use  . Yes    Comment: occ     History  Drug Use No    History   Social History  . Marital Status: Single    Spouse Name: N/A  . Number of Children: N/A  . Years of Education: N/A   Social History Main Topics  . Smoking status: Current Every Day Smoker -- 1.00 packs/day    Types: Cigars    Start date: 02/09/1961  . Smokeless tobacco: Never Used     Comment: 2 ppd, full flavor  . Alcohol Use: Yes     Comment: occ  . Drug Use: No  . Sexual Activity: Yes   Other Topics Concern  . None    Social History Narrative   Recovering alcoholic   Additional Social History:    Pain Medications: None Prescriptions: Prilosec & Lasics (out of both of them) Over the Counter: None History of alcohol / drug use?: Yes Withdrawal Symptoms: Sweats, Tremors, Weakness, Fever / Chills, Patient aware of relationship between substance abuse and physical/medical complications, Blackouts, Nausea / Vomiting Name of Substance 1: ETOH (Liquor & beer) 1 - Age of First Use: "Around 64 years old." 1 - Amount (size/oz): Amount varies according to how much money he has. 1 - Frequency: Varies (4 times per week at least) 1 - Duration: On-going 1 - Last Use / Amount: 05/09 "I drank a 5th today I know." Name of Substance 2: Cocaine 2 - Age of First Use: "In my 45's" 2 - Amount (size/oz): Amount varies according to money  2 - Frequency: Varies 2 - Duration: On-going 2 - Last Use / Amount: 05/09 $20 worth                 Allergies:   Allergies  Allergen Reactions  . Darvocet [Propoxyphene N-Acetaminophen] Hives  . Haldol [Haloperidol Decanoate] Hives    Labs:  Results for orders placed or performed during the hospital encounter of 06/18/14 (from the past 48 hour(s))  CBC with Differential/Platelet     Status: None   Collection Time: 06/18/14  7:18 PM  Result Value Ref Range   WBC 7.4 4.0 - 10.5 K/uL   RBC 5.27 4.22 - 5.81 MIL/uL   Hemoglobin 16.5 13.0 - 17.0 g/dL   HCT 50.2 39.0 - 52.0 %   MCV 95.3 78.0 - 100.0 fL   MCH 31.3 26.0 - 34.0 pg   MCHC 32.9 30.0 - 36.0 g/dL   RDW 14.7 11.5 - 15.5 %   Platelets 194 150 - 400 K/uL   Neutrophils Relative % 51 43 - 77 %   Neutro Abs 3.8 1.7 - 7.7 K/uL   Lymphocytes Relative 33 12 - 46 %   Lymphs Abs 2.4 0.7 - 4.0 K/uL   Monocytes Relative 11 3 - 12 %   Monocytes Absolute 0.8 0.1 - 1.0 K/uL   Eosinophils Relative 4 0 - 5 %   Eosinophils Absolute 0.3 0.0 - 0.7 K/uL   Basophils Relative 1 0 - 1 %   Basophils Absolute 0.1 0.0 - 0.1 K/uL   Comprehensive metabolic panel     Status: Abnormal   Collection Time: 06/18/14  7:18 PM  Result Value Ref Range   Sodium 136 135 - 145 mmol/L   Potassium 3.8 3.5 - 5.1 mmol/L   Chloride 104 101 - 111 mmol/L   CO2 21 (L) 22 - 32 mmol/L   Glucose, Bld 123 (H) 70 - 99 mg/dL  BUN 13 6 - 20 mg/dL   Creatinine, Ser 0.94 0.61 - 1.24 mg/dL   Calcium 9.0 8.9 - 10.3 mg/dL   Total Protein 6.8 6.5 - 8.1 g/dL   Albumin 3.7 3.5 - 5.0 g/dL   AST 26 15 - 41 U/L   ALT 21 17 - 63 U/L   Alkaline Phosphatase 61 38 - 126 U/L   Total Bilirubin 0.6 0.3 - 1.2 mg/dL   GFR calc non Af Amer >60 >60 mL/min   GFR calc Af Amer >60 >60 mL/min    Comment: (NOTE) The eGFR has been calculated using the CKD EPI equation. This calculation has not been validated in all clinical situations. eGFR's persistently <60 mL/min signify possible Chronic Kidney Disease.    Anion gap 11 5 - 15  Lipase, blood     Status: Abnormal   Collection Time: 06/18/14  7:18 PM  Result Value Ref Range   Lipase 17 (L) 22 - 51 U/L  Ethanol     Status: Abnormal   Collection Time: 06/18/14  7:18 PM  Result Value Ref Range   Alcohol, Ethyl (B) 316 (HH) <5 mg/dL    Comment:        LOWEST DETECTABLE LIMIT FOR SERUM ALCOHOL IS 11 mg/dL FOR MEDICAL PURPOSES ONLY RESULT REPEATED AND VERIFIED CRITICAL RESULT CALLED TO, READ BACK BY AND VERIFIED WITH: TEUP AT 1951 ON 06/18/14 BY S.VANHOORNE   Brain natriuretic peptide     Status: None   Collection Time: 06/18/14  7:18 PM  Result Value Ref Range   B Natriuretic Peptide 50.9 0.0 - 100.0 pg/mL  Troponin I     Status: None   Collection Time: 06/18/14  7:18 PM  Result Value Ref Range   Troponin I <0.03 <0.031 ng/mL    Comment:        NO INDICATION OF MYOCARDIAL INJURY.   Urine rapid drug screen (hosp performed)     Status: Abnormal   Collection Time: 06/18/14 10:26 PM  Result Value Ref Range   Opiates NONE DETECTED NONE DETECTED   Cocaine POSITIVE (A) NONE DETECTED   Benzodiazepines  NONE DETECTED NONE DETECTED   Amphetamines NONE DETECTED NONE DETECTED   Tetrahydrocannabinol NONE DETECTED NONE DETECTED   Barbiturates NONE DETECTED NONE DETECTED    Comment:        DRUG SCREEN FOR MEDICAL PURPOSES ONLY.  IF CONFIRMATION IS NEEDED FOR ANY PURPOSE, NOTIFY LAB WITHIN 5 DAYS.        LOWEST DETECTABLE LIMITS FOR URINE DRUG SCREEN Drug Class       Cutoff (ng/mL) Amphetamine      1000 Barbiturate      200 Benzodiazepine   751 Tricyclics       025 Opiates          300 Cocaine          300 THC              50     Vitals: Blood pressure 180/89, pulse 91, temperature 98 F (36.7 C), temperature source Oral, resp. rate 20, SpO2 95 %.  Risk to Self: Suicidal Ideation: Yes-Currently Present Suicidal Intent: No-Not Currently/Within Last 6 Months Is patient at risk for suicide?: Yes Suicidal Plan?: Yes-Currently Present Specify Current Suicidal Plan: Pt mentioned shooting himself.   Access to Means: No What has been your use of drugs/alcohol within the last 12 months?: ETOH & crack cocaine How many times?:  ("Right many"  ) Other Self Harm Risks: None  Triggers for Past Attempts: Unpredictable Intentional Self Injurious Behavior: None Risk to Others: Homicidal Ideation: No-Not Currently/Within Last 6 Months Thoughts of Harm to Others: No-Not Currently Present/Within Last 6 Months Current Homicidal Intent: No Current Homicidal Plan: No Access to Homicidal Means: No Identified Victim: No one History of harm to others?: Yes Assessment of Violence: In distant past Violent Behavior Description: None reported now. Does patient have access to weapons?: No Criminal Charges Pending?: No Does patient have a court date: No Prior Inpatient Therapy: Prior Inpatient Therapy: Yes Prior Therapy Dates: Cannot remember when Prior Therapy Facilty/Provider(s): [facility] Reason for Treatment: SI Prior Outpatient Therapy: Prior Outpatient Therapy: No Prior Therapy Dates: None Prior  Therapy Facilty/Provider(s): None Reason for Treatment: N/A Does patient have an ACCT team?: No Does patient have Intensive In-House Services?  : No Does patient have [facility] services? : No Does patient have P4CC services?: No  Current Facility-Administered Medications  Medication Dose Route Frequency Provider Last Rate Last Dose  . 0.9 %  sodium chloride infusion   Intravenous Continuous Lacretia Leigh, MD   Stopped at 06/18/14 2031  . albuterol (PROVENTIL HFA;VENTOLIN HFA) 108 (90 BASE) MCG/ACT inhaler 2 puff  2 puff Inhalation Q6H PRN Lacretia Leigh, MD      . alum & mag hydroxide-simeth (MAALOX/MYLANTA) 200-200-20 MG/5ML suspension 30 mL  30 mL Oral PRN Lacretia Leigh, MD      . aspirin EC tablet 81 mg  81 mg Oral Daily Lacretia Leigh, MD   81 mg at 06/20/14 0953  . LORazepam (ATIVAN) tablet 0-4 mg  0-4 mg Oral 4 times per day Lacretia Leigh, MD   2 mg at 06/20/14 3235   Followed by  . [START ON 06/21/2014] LORazepam (ATIVAN) tablet 0-4 mg  0-4 mg Oral Q12H Lacretia Leigh, MD      . menthol-cetylpyridinium (CEPACOL) lozenge 3 mg  1 lozenge Oral PRN Linton Flemings, MD   3 mg at 06/19/14 0630  . mometasone-formoterol (DULERA) 200-5 MCG/ACT inhaler 2 puff  2 puff Inhalation BID Lacretia Leigh, MD   2 puff at 06/20/14 0908  . nicotine (NICODERM CQ - dosed in mg/24 hours) patch 21 mg  21 mg Transdermal Daily Lacretia Leigh, MD   21 mg at 06/20/14 0954  . predniSONE (DELTASONE) tablet 50 mg  50 mg Oral Daily Lacretia Leigh, MD   50 mg at 06/20/14 0953  . tiotropium (SPIRIVA) inhalation capsule 18 mcg  18 mcg Inhalation Daily Lacretia Leigh, MD   18 mcg at 06/20/14 0908   Current Outpatient Prescriptions  Medication Sig Dispense Refill  . clotrimazole-betamethasone (LOTRISONE) cream Apply 1 application topically 2 (two) times daily. 30 g 0  . furosemide (LASIX) 40 MG tablet Take 1 tablet (40 mg total) by mouth daily. 30 tablet 3  . Multiple Vitamins-Minerals (MULTIVITAMIN ADULT PO) Take 1 tablet by mouth  daily.    [name] omeprazole (PRILOSEC) 20 MG capsule Take 1 capsule (20 mg total) by mouth daily. 30 capsule 11  . Pyridoxine HCl (VITAMIN B-6 PO) Take 1 tablet by mouth daily.    . vitamin B-12 (CYANOCOBALAMIN) 100 MCG tablet Take 100 mcg by mouth daily.    . vitamin E 200 UNIT capsule Take 200 Units by mouth daily.    [name] albuterol (PROVENTIL HFA;VENTOLIN HFA) 108 (90 BASE) MCG/ACT inhaler Inhale 2 puffs into the lungs every 6 (six) hours as needed for wheezing or shortness of breath. (Patient not taking: Reported on 06/18/2014) 1 Inhaler 3  . amLODipine (NORVASC) 5  MG tablet Take 1 tablet (5 mg total) by mouth daily. (Patient not taking: Reported on 06/18/2014) 30 tablet 0  . aspirin EC 81 MG tablet Take 1 tablet (81 mg total) by mouth daily. (Patient not taking: Reported on 02/21/2014) 90 tablet 3  . atorvastatin (LIPITOR) 40 MG tablet Take 1 tablet (40 mg total) by mouth daily. (Patient not taking: Reported on 06/18/2014) 90 tablet 3  . lisinopril (PRINIVIL,ZESTRIL) 20 MG tablet Take 0.5 tablets (10 mg total) by mouth daily. (Patient not taking: Reported on 06/18/2014) 90 tablet 3  . mometasone-formoterol (DULERA) 200-5 MCG/ACT AERO Inhale 2 puffs into the lungs 2 (two) times daily. (Patient not taking: Reported on 06/18/2014) 13 g 3  . nortriptyline (PAMELOR) 25 MG capsule Take 1 capsule (25 mg total) by mouth at bedtime. (Patient not taking: Reported on 06/18/2014) 30 capsule 1  . oxyCODONE-acetaminophen (PERCOCET) 5-325 MG per tablet Take 2 tablets by mouth every 6 (six) hours as needed for moderate pain. (Patient not taking: Reported on 06/18/2014) 12 tablet 0  . potassium chloride (K-DUR) 10 MEQ tablet Take 2 tablets (20 mEq total) by mouth daily. (Patient not taking: Reported on 06/18/2014) 30 tablet 0  . predniSONE (DELTASONE) 50 MG tablet Take 1 tablet (50 mg total) by mouth daily. For 5 days. (Patient not taking: Reported on 02/21/2014) 5 tablet 0  . Rivaroxaban (XARELTO STARTER PACK) 15 & 20 MG TBPK 30m tablet  by mouth twice a day with food change to 20 mg tab once daily on day 22. (Patient not taking: Reported on 06/18/2014) 51 each 0  . Spacer/Aero-Holding Chambers (BREATHERITE COLL SPACER ADULT) MISC 1 Device by Does not apply route as needed (with inhaler). (Patient not taking: Reported on 06/18/2014) 1 each 0  . tiotropium (SPIRIVA) 18 MCG inhalation capsule Place 1 capsule (18 mcg total) into inhaler and inhale daily. (Patient not taking: Reported on 06/18/2014) 30 capsule 3    Musculoskeletal: Strength & Muscle Tone: Normal Gait & Station: normal Patient leans: N/A  Psychiatric Specialty Exam:     Blood pressure 180/89, pulse 91, temperature 98 F (36.7 C), temperature source Oral, resp. rate 20, SpO2 95 %.There is no weight on file to calculate BMI.  General Appearance: Casual  Eye Contact::  Good  Speech:  Normal Rate  Volume:  Normal  Mood:  Anxious  Affect:  Congruent  Thought Process:  Coherent  Orientation:  Full (Time, Place, and Person)  Thought Content:  WDL  Suicidal Thoughts:  No  Homicidal Thoughts:  No  Memory:  Immediate;   Fair Recent;   Fair Remote;   Fair  Judgement:  Fair  Insight:  Fair  Psychomotor Activity:  Normal  Concentration: Good  Recall:  Good  Fund of Knowledge:  Good  Language: Good  Akathisia:  No  Handed:  Right  AIMS (if indicated):     Assets:  Housing Leisure Time Resilience  ADL's:  Intact  Cognition: WNL  Sleep:      Medical Decision Making: Review of Psycho-Social Stressors (1), Review or order clinical lab tests (1) and Review of Medication Regimen & Side Effects (2)  Treatment Plan Summary:  Plan:  Discharge home with alcohol abuse resources for recovery  LWaylan Boga PCovington5/12/2014 1:55 PM Patient seen face-to-face for psychiatric evaluation, chart reviewed and case discussed with the physician extender and developed treatment plan. Reviewed the information documented and agree with the treatment plan. MCorena Pilgrim  MD

## 2014-06-20 NOTE — BH Assessment (Signed)
[facility] Assessment Progress Note  Per Corena Pilgrim, MD this pt does not require psychiatric hospitalization at this time. He is to be discharged from [facility] with [facility] for [facility]. This has been included in his discharge instructions. Pt's nurse has been notified.  Jalene Mullet, MA Triage Specialist [phone]

## 2014-06-20 NOTE — BHH Suicide Risk Assessment (Signed)
Suicide Risk Assessment  Discharge Assessment   [facility] Discharge Suicide Risk Assessment   Demographic Factors:  Male, Age 65 or older, Caucasian and Living alone  Total Time spent with patient: 30 minutes  Musculoskeletal: Strength & Muscle Tone: Normal Gait & Station: normal Patient leans: N/A  Psychiatric Specialty Exam:     Blood pressure 180/89, pulse 91, temperature 98 F (36.7 C), temperature source Oral, resp. rate 20, SpO2 95 %.There is no weight on file to calculate BMI.  General Appearance: Casual  Eye Contact::  Good  Speech:  Normal Rate  Volume:  Normal  Mood:  Anxious  Affect:  Congruent  Thought Process:  Coherent  Orientation:  Full (Time, Place, and Person)  Thought Content:  WDL  Suicidal Thoughts:  No  Homicidal Thoughts:  No  Memory:  Immediate;   Fair Recent;   Fair Remote;   Fair  Judgement:  Fair  Insight:  Fair  Psychomotor Activity:  Normal  Concentration: Good  Recall:  Good  Fund of Knowledge:  Good  Language: Good  Akathisia:  No  Handed:  Right  AIMS (if indicated):     Assets:  Housing Leisure Time Resilience  ADL's:  Intact  Cognition: WNL  Sleep:      Has this patient used any form of tobacco in the last 30 days? (Cigarettes, Smokeless Tobacco, Cigars, and/or Pipes) No  Mental Status Per Nursing Assessment::   On Admission:   Chest pain due to cocaine abuse  Current Mental Status by Physician: NA  Loss Factors: NA  Historical Factors: NA  Risk Reduction Factors:   Sense of responsibility to family and Positive social support  Continued Clinical Symptoms:  Anxiety  Cognitive Features That Contribute To Risk:  None    Suicide Risk:  Minimal: No identifiable suicidal ideation.  Patients presenting with no risk factors but with morbid ruminations; may be classified as minimal risk based on the severity of the depressive symptoms  Principal Problem: Alcohol dependence with withdrawal with complication Discharge  Diagnoses:  Patient Active Problem List   Diagnosis Date Noted  . Alcohol dependence with withdrawal with complication [K59.935] 70/17/7939    Priority: High  . DVT (deep venous thrombosis) [I82.409] 02/22/2014  . Lower extremity edema [R60.0] 02/21/2014  . Diastolic CHF, acute on chronic [I50.33] 11/21/2013  . CAD (coronary artery disease) [I25.10] 11/21/2013  . Alcohol abuse [F10.10] 11/21/2013  . GERD (gastroesophageal reflux disease) [K21.9] 08/10/2013  . Gout [M10.9] 08/10/2013  . Tobacco abuse [Z72.0] 07/26/2013  . Dizziness [R42] 04/10/2013  . Binocular vision disorder with diplopia [H53.2] 01/03/2013  . Fracture of inferior orbital wall [S02.3XXA] 01/03/2013  . Peripheral vascular disease [I73.9] 07/27/2012  . COPD (chronic obstructive pulmonary disease) [J44.9] 04/04/2012  . HTN (hypertension) [I10] 04/04/2012      Plan Of Care/Follow-up recommendations:  Activity:  as tolerated Diet:  heart healthy diet  Is patient on multiple antipsychotic therapies at discharge:  No   Has Patient had three or more failed trials of antipsychotic monotherapy by history:  No  Recommended Plan for Multiple Antipsychotic Therapies: NA    LORD, [place], PMH-NP 06/20/2014, 1:59 PM

## 2014-06-20 NOTE — Discharge Instructions (Signed)
To help you maintain a sober lifestyle, a substance abuse treatment program may be beneficial to you.  Consider contacting [facility] to see about enrolling in their program.  New patients are seen at their walk-in clinic every Tuesday from 9:00 am - 12:00 pm.       [facility]      [address], [place]. [place], [place] [address]      [phone]

## 2014-06-23 ENCOUNTER — Emergency Department (HOSPITAL_COMMUNITY): Payer: Medicare Other

## 2014-06-23 ENCOUNTER — Encounter (HOSPITAL_COMMUNITY): Payer: Self-pay | Admitting: Nurse Practitioner

## 2014-06-23 ENCOUNTER — Emergency Department (HOSPITAL_COMMUNITY)
Admission: EM | Admit: 2014-06-23 | Discharge: 2014-06-23 | Discharge status: Against Medical Advice | Payer: Medicare Other | Attending: Emergency Medicine | Admitting: Emergency Medicine

## 2014-06-23 DIAGNOSIS — R55 Syncope and collapse: Secondary | ICD-10-CM | POA: Insufficient documentation

## 2014-06-23 DIAGNOSIS — Z8701 Personal history of pneumonia (recurrent): Secondary | ICD-10-CM | POA: Diagnosis not present

## 2014-06-23 DIAGNOSIS — Z79899 Other long term (current) drug therapy: Secondary | ICD-10-CM | POA: Insufficient documentation

## 2014-06-23 DIAGNOSIS — F10220 Alcohol dependence with intoxication, uncomplicated: Secondary | ICD-10-CM | POA: Diagnosis not present

## 2014-06-23 DIAGNOSIS — F10129 Alcohol abuse with intoxication, unspecified: Secondary | ICD-10-CM | POA: Diagnosis not present

## 2014-06-23 DIAGNOSIS — J45909 Unspecified asthma, uncomplicated: Secondary | ICD-10-CM | POA: Insufficient documentation

## 2014-06-23 DIAGNOSIS — Z8781 Personal history of (healed) traumatic fracture: Secondary | ICD-10-CM | POA: Insufficient documentation

## 2014-06-23 DIAGNOSIS — Z720 Tobacco use: Secondary | ICD-10-CM | POA: Insufficient documentation

## 2014-06-23 DIAGNOSIS — R079 Chest pain, unspecified: Secondary | ICD-10-CM | POA: Insufficient documentation

## 2014-06-23 DIAGNOSIS — T1491 Suicide attempt: Secondary | ICD-10-CM | POA: Diagnosis not present

## 2014-06-23 DIAGNOSIS — Z9981 Dependence on supplemental oxygen: Secondary | ICD-10-CM | POA: Insufficient documentation

## 2014-06-23 DIAGNOSIS — Y908 Blood alcohol level of 240 mg/100 ml or more: Secondary | ICD-10-CM | POA: Diagnosis not present

## 2014-06-23 DIAGNOSIS — I2510 Atherosclerotic heart disease of native coronary artery without angina pectoris: Secondary | ICD-10-CM | POA: Insufficient documentation

## 2014-06-23 DIAGNOSIS — Z86718 Personal history of other venous thrombosis and embolism: Secondary | ICD-10-CM | POA: Insufficient documentation

## 2014-06-23 DIAGNOSIS — R42 Dizziness and giddiness: Secondary | ICD-10-CM | POA: Diagnosis not present

## 2014-06-23 DIAGNOSIS — I252 Old myocardial infarction: Secondary | ICD-10-CM | POA: Diagnosis not present

## 2014-06-23 DIAGNOSIS — F1020 Alcohol dependence, uncomplicated: Secondary | ICD-10-CM

## 2014-06-23 DIAGNOSIS — J441 Chronic obstructive pulmonary disease with (acute) exacerbation: Secondary | ICD-10-CM | POA: Insufficient documentation

## 2014-06-23 DIAGNOSIS — Z9889 Other specified postprocedural states: Secondary | ICD-10-CM | POA: Insufficient documentation

## 2014-06-23 DIAGNOSIS — Z7952 Long term (current) use of systemic steroids: Secondary | ICD-10-CM | POA: Insufficient documentation

## 2014-06-23 DIAGNOSIS — Z8639 Personal history of other endocrine, nutritional and metabolic disease: Secondary | ICD-10-CM | POA: Insufficient documentation

## 2014-06-23 DIAGNOSIS — G4733 Obstructive sleep apnea (adult) (pediatric): Secondary | ICD-10-CM | POA: Diagnosis not present

## 2014-06-23 DIAGNOSIS — I5030 Unspecified diastolic (congestive) heart failure: Secondary | ICD-10-CM | POA: Diagnosis not present

## 2014-06-23 DIAGNOSIS — I10 Essential (primary) hypertension: Secondary | ICD-10-CM | POA: Insufficient documentation

## 2014-06-23 DIAGNOSIS — Z8739 Personal history of other diseases of the musculoskeletal system and connective tissue: Secondary | ICD-10-CM | POA: Diagnosis not present

## 2014-06-23 DIAGNOSIS — R2242 Localized swelling, mass and lump, left lower limb: Secondary | ICD-10-CM | POA: Diagnosis not present

## 2014-06-23 DIAGNOSIS — K219 Gastro-esophageal reflux disease without esophagitis: Secondary | ICD-10-CM | POA: Diagnosis not present

## 2014-06-23 LAB — CBC WITH DIFFERENTIAL/PLATELET
Basophils Absolute: 0.1 10*3/uL (ref 0.0–0.1)
Basophils Relative: 1 % (ref 0–1)
EOS PCT: 2 % (ref 0–5)
Eosinophils Absolute: 0.1 10*3/uL (ref 0.0–0.7)
HCT: 49.8 % (ref 39.0–52.0)
HEMOGLOBIN: 16.6 g/dL (ref 13.0–17.0)
Lymphocytes Relative: 29 % (ref 12–46)
Lymphs Abs: 2 10*3/uL (ref 0.7–4.0)
MCH: 31.4 pg (ref 26.0–34.0)
MCHC: 33.3 g/dL (ref 30.0–36.0)
MCV: 94.3 fL (ref 78.0–100.0)
Monocytes Absolute: 0.8 10*3/uL (ref 0.1–1.0)
Monocytes Relative: 11 % (ref 3–12)
Neutro Abs: 4.1 10*3/uL (ref 1.7–7.7)
Neutrophils Relative %: 57 % (ref 43–77)
Platelets: 179 10*3/uL (ref 150–400)
RBC: 5.28 MIL/uL (ref 4.22–5.81)
RDW: 14.6 % (ref 11.5–15.5)
WBC: 7 10*3/uL (ref 4.0–10.5)

## 2014-06-23 LAB — BASIC METABOLIC PANEL
ANION GAP: 11 (ref 5–15)
BUN: 9 mg/dL (ref 6–20)
CALCIUM: 9 mg/dL (ref 8.9–10.3)
CO2: 26 mmol/L (ref 22–32)
CREATININE: 1.03 mg/dL (ref 0.61–1.24)
Chloride: 105 mmol/L (ref 101–111)
GFR calc Af Amer: >60 mL/min (ref >60)
GFR calc non Af Amer: >60 mL/min (ref >60)
Glucose, Bld: 100 mg/dL — ABNORMAL HIGH (ref 65–99)
Potassium: 3.8 mmol/L (ref 3.5–5.1)
SODIUM: 142 mmol/L (ref 135–145)

## 2014-06-23 LAB — I-STAT CG4 LACTIC ACID, ED: LACTIC ACID, VENOUS: 3.22 mmol/L — AB (ref 0.5–2.0)

## 2014-06-23 LAB — ETHANOL: ALCOHOL ETHYL (B): 304 mg/dL — AB (ref <5)

## 2014-06-23 LAB — TROPONIN I: Troponin I: 0.03 ng/mL (ref <0.031)

## 2014-06-23 LAB — BRAIN NATRIURETIC PEPTIDE: B Natriuretic Peptide: 42.4 pg/mL (ref 0.0–100.0)

## 2014-06-23 MED ORDER — IPRATROPIUM BROMIDE 0.02 % IN SOLN
0.5000 mg | Freq: Once | RESPIRATORY_TRACT | Status: AC
  Administered 2014-06-23: 0.5 mg via RESPIRATORY_TRACT
  Filled 2014-06-23: qty 2.5

## 2014-06-23 MED ORDER — ALBUTEROL SULFATE (2.5 MG/3ML) 0.083% IN NEBU
5.0000 mg | INHALATION_SOLUTION | RESPIRATORY_TRACT | Status: DC | PRN
  Administered 2014-06-23: 5 mg via RESPIRATORY_TRACT
  Filled 2014-06-23: qty 6

## 2014-06-23 NOTE — ED Notes (Signed)
Dr. Nanavati at bedside 

## 2014-06-23 NOTE — ED Notes (Addendum)
Friends at bedside. Informed that pt is not going to be discharged, still intending to get more bloodwork. Asked pt if he is still suicidal, unclear answer.

## 2014-06-23 NOTE — ED Notes (Signed)
Pt refuses to stay, pt reports he is leaving. Pt does not want any more blood drawn. MD informed. Pt leaving AMA.

## 2014-06-23 NOTE — ED Provider Notes (Signed)
CSN: [id number]     Arrival date & time 06/23/14  1658 History   First MD Initiated Contact with Patient 06/23/14 1702     Chief Complaint  Patient presents with  . Alcohol Intoxication  . Chest Pain     (Consider location/radiation/quality/duration/timing/severity/associated sxs/prior Treatment) HPI Comments: Pt with PMH significant for alcohol abuse, COPD, CAD, PVD, HTN who comes in with cc of chest pain, dib. Pt repots that he has mid-sternal chest pain for the past few days. Chest pain is sharp. He has some associated dib, cough and wheezing. Pt is out of COPD meds, and no meds taken. He is an active heavy drinker. Pt's pain is non radiating, and he denies any nausea, sweats. Pt has psych hx and also states he is suicidal. He has no current plans.  EMS also reports that pt had a brief, 30 second episode of being unresponsive when they were transferring him. He had a pulse, no seizure like activity.  ROS 10 Systems reviewed and are negative for acute change except as noted in the HPI.     Patient is a 64 y.o. male presenting with intoxication and chest pain. The history is provided by the patient.  Alcohol Intoxication Associated symptoms include chest pain and shortness of breath.  Chest Pain Associated symptoms: shortness of breath     Past Medical History  Diagnosis Date  . Depression   . Hypertension   . Hyperlipidemia   . Myocardial infarction 2000  . CHF (congestive heart failure)   . COPD (chronic obstructive pulmonary disease)   . Peripheral vascular disease, unspecified     08/20/10 doppler: increase in right ABI post-op. Left ABI stable. S/P bi-fem bypass surgery  . GERD (gastroesophageal reflux disease)   . Asthma   . Anxiety   . History of DVT of lower extremity   . CAD (coronary artery disease) 05/27/10    Cath: severe single vessell CAD left cx midportion obtuse marginal 2 to 3.  . Shortness of breath   . Pneumonia 04/04/2012  . Arthritis   . OSA on CPAP    . Hyperlipemia   . COPD (chronic obstructive pulmonary disease)   . Alcohol abuse     H/O  . Tobacco abuse   . Orbital fracture 12/2012   Past Surgical History  Procedure Laterality Date  . Femoral bypass  08/19/10    Right Fem-Pop  . Aortogram w/ ptca  09/292003  . Eye surgery    . Back surgery    . Total knee arthroplasty    . Cholecystectomy    . Cardiac catheterization  05/27/10    severe CAD left cx  . Abdoninal ao angio & bifem angio  05/27/10    Patent graft, occluded bil stents with no retrograde flow into the hypogastric arteries. 100% occl left ant. tibial artery, 70% to 80% to 100% stenosis right superficial fem artery above adductor canal. 100 % occl right ant. tibial vessell  . Esophagogastric fundoplication     Family History  Problem Relation Age of Onset  . Heart attack Mother   . Cirrhosis Father   . Heart failure Mother   . Heart failure Brother   . Cancer Brother    History  Substance Use Topics  . Smoking status: Current Every Day Smoker -- 1.00 packs/day    Types: Cigars    Start date: 02/09/1961  . Smokeless tobacco: Never Used     Comment: 2 ppd, full flavor  . Alcohol Use:  Yes     Comment: occ    Review of Systems  Respiratory: Positive for shortness of breath and wheezing.   Cardiovascular: Positive for chest pain.  All other systems reviewed and are negative.     Allergies  Darvocet and Haldol  Home Medications   Prior to Admission medications   Medication Sig Start Date End Date Taking? Authorizing Provider  albuterol (PROVENTIL HFA;VENTOLIN HFA) 108 (90 BASE) MCG/ACT inhaler Inhale 2 puffs into the lungs every 6 (six) hours as needed for wheezing or shortness of breath. Patient not taking: Reported on 06/18/2014 11/21/13   Coral Spikes, DO  amLODipine (NORVASC) 5 MG tablet Take 1 tablet (5 mg total) by mouth daily. Patient not taking: Reported on 06/18/2014 04/12/13   Leone Brand, MD  aspirin EC 81 MG tablet Take 1 tablet (81 mg  total) by mouth daily. Patient not taking: Reported on 02/21/2014 07/26/13   Timmothy Euler, MD  atorvastatin (LIPITOR) 40 MG tablet Take 1 tablet (40 mg total) by mouth daily. Patient not taking: Reported on 06/18/2014 10/02/13   Timmothy Euler, MD  clotrimazole-betamethasone (LOTRISONE) cream Apply 1 application topically 2 (two) times daily. 02/21/14   Timmothy Euler, MD  furosemide (LASIX) 40 MG tablet Take 1 tablet (40 mg total) by mouth daily. 02/21/14   Timmothy Euler, MD  lisinopril (PRINIVIL,ZESTRIL) 20 MG tablet Take 0.5 tablets (10 mg total) by mouth daily. Patient not taking: Reported on 06/18/2014 02/23/14   Hilton Sinclair, MD  mometasone-formoterol [facility]) 200-5 MCG/ACT AERO Inhale 2 puffs into the lungs 2 (two) times daily. Patient not taking: Reported on 06/18/2014 11/21/13   Coral Spikes, DO  Multiple Vitamins-Minerals (MULTIVITAMIN ADULT PO) Take 1 tablet by mouth daily.    Historical Provider, MD  nortriptyline (PAMELOR) 25 MG capsule Take 1 capsule (25 mg total) by mouth at bedtime. Patient not taking: Reported on 06/18/2014 08/03/13   Zenia Resides, MD  omeprazole (PRILOSEC) 20 MG capsule Take 1 capsule (20 mg total) by mouth daily. 02/21/14   Timmothy Euler, MD  oxyCODONE-acetaminophen (PERCOCET) 5-325 MG per tablet Take 2 tablets by mouth every 6 (six) hours as needed for moderate pain. Patient not taking: Reported on 06/18/2014 06/05/13   Wandra Arthurs, MD  potassium chloride (K-DUR) 10 MEQ tablet Take 2 tablets (20 mEq total) by mouth daily. Patient not taking: Reported on 06/18/2014 11/21/13   Coral Spikes, DO  predniSONE (DELTASONE) 50 MG tablet Take 1 tablet (50 mg total) by mouth daily. For 5 days. Patient not taking: Reported on 02/21/2014 11/23/13   Coral Spikes, DO  Pyridoxine HCl (VITAMIN B-6 PO) Take 1 tablet by mouth daily.    Historical Provider, MD  Rivaroxaban (XARELTO STARTER PACK) 15 & 20 MG TBPK '15mg'$  tablet by mouth twice a day with food change to 20 mg  tab once daily on day 22. Patient not taking: Reported on 06/18/2014 02/22/14   Timmothy Euler, MD  Spacer/Aero-Holding Chambers (BREATHERITE COLL SPACER ADULT) MISC 1 Device by Does not apply route as needed (with inhaler). Patient not taking: Reported on 06/18/2014 07/26/13   Timmothy Euler, MD  tiotropium (SPIRIVA) 18 MCG inhalation capsule Place 1 capsule (18 mcg total) into inhaler and inhale daily. Patient not taking: Reported on 06/18/2014 11/21/13   Coral Spikes, DO  vitamin B-12 (CYANOCOBALAMIN) 100 MCG tablet Take 100 mcg by mouth daily.    Historical Provider, MD  vitamin E 200  UNIT capsule Take 200 Units by mouth daily.    Historical Provider, MD   BP 100/57 mmHg  Pulse 65  Temp(Src) 98.2 F (36.8 C) (Oral)  Resp 16  SpO2 94% Physical Exam  Constitutional: He is oriented to person, place, and time. He appears well-developed.  HENT:  Head: Normocephalic and atraumatic.  Eyes: Conjunctivae and EOM are normal. Pupils are equal, round, and reactive to light.  Neck: Normal range of motion. Neck supple. No JVD present.  Cardiovascular: Normal rate and regular rhythm.   Pulmonary/Chest: Effort normal. He has wheezes.  Mild wheezing and rhonchi, no significantly increased expiratory phase  Abdominal: Soft. Bowel sounds are normal. He exhibits no distension. There is no tenderness. There is no rebound and no guarding.  Musculoskeletal: He exhibits edema.  LLE - slight increased swelling compared to the contralateral side. 1+ pitting edema  Neurological: He is alert and oriented to person, place, and time.  Skin: Skin is warm.  Nursing note and vitals reviewed.   ED Course  Procedures (including critical care time) Labs Review Labs Reviewed  BASIC METABOLIC PANEL - Abnormal; Notable for the following:    Glucose, Bld 100 (*)    All other components within normal limits  ETHANOL - Abnormal; Notable for the following:    Alcohol, Ethyl (B) 304 (*)    All other components  within normal limits  I-STAT CG4 LACTIC ACID, ED - Abnormal; Notable for the following:    Lactic Acid, Venous 3.22 (*)    All other components within normal limits  CBC WITH DIFFERENTIAL/PLATELET  BRAIN NATRIURETIC PEPTIDE  TROPONIN I    Imaging Review Dg Chest 2 View  06/23/2014   CLINICAL DATA:  Chest pain 2 months.  EXAM: CHEST  2 VIEW  COMPARISON:  06/18/2014 and 06/05/2013 as well as 04/09/2013  FINDINGS: Lungs are adequately inflated without focal consolidation or effusion. Mild flattening of the hemidiaphragms. Stable smooth pleural thickening over the right lateral thorax likely related to previous rib fracture. Stable cardiomegaly. Degenerative change of the spine.  IMPRESSION: No acute cardiopulmonary disease.  Stable cardiomegaly.   Electronically Signed   By: Marin Olp M.D.   On: 06/23/2014 19:37     EKG Interpretation   Date/Time:  Saturday Jun 23 2014 17:03:27 EDT Ventricular Rate:  93 PR Interval:  154 QRS Duration: 97 QT Interval:  388 QTC Calculation: 483 R Axis:   94 Text Interpretation:  Sinus rhythm Right axis deviation Anteroseptal  infarct, old No significant change since last tracing Reconfirmed by  Sierra Spargo, MD, Thelma Comp [phone]) on 06/23/2014 5:10:42 PM      @ 9:30 pm: Pt states that he is chest pain free now. 2nd trop is pending. Pain is atypical. Pt reports that he is in the ER for detox and detox alone. He is sure he didn't have a heart attack, he is sure he never passed out, and he is surely not suicidal - that's why he is here for detox. I explained to him our policy on alcohol detox, and informed him that we will get 2nd trop, which if neg, we will d/c him with resources. Pt states that he is in the ER for detox alone - and will be calling his ride. I advised him to stay in the ER and get the 2nd trop at least - and it seems that pt's ride arrived and he just eloped.  MDM   Final diagnoses:  Chest pain  Syncope, unspecified syncope type  Alcoholism    Pt comes in with cc of chest pain. He has COPD, diastolic CHF, CAD, PVD. Patient has some mild wheezing on exam, but also lower extremity swelling. DDX: ACS, COPD, CHF exacerbations. Will get appropriate labs.  Also, pt appears to have had a syncopal episode en route to ER. Not sure if it was alcohol induced or cardiogenic. He has significant risk factors for cardiac etiology, and was hypoxic on room air - so he will need admission for syncope and optimization.  He is also suicidal and an alcoholic. Not medically cleared for psych eval.    Varney Biles, MD 06/24/14 [phone]

## 2014-06-23 NOTE — ED Notes (Signed)
Per [facility] pt found at [facility] - [name], called out for SI and detox from [facility]. Upon arrival patient alert with slurred speech, admitted to drinking 5th of vodka. Enroute patient became unresponsive for appx 30 seconds, pre regained consciousness and endorsed central chest pain. SpO2 86%/RA placed on 2L [place] an SpO2 increased to 94%

## 2014-07-02 ENCOUNTER — Ambulatory Visit: Payer: Medicaid Other | Admitting: Internal Medicine

## 2014-07-24 DIAGNOSIS — Z961 Presence of intraocular lens: Secondary | ICD-10-CM | POA: Diagnosis not present

## 2014-07-26 DIAGNOSIS — M79604 Pain in right leg: Secondary | ICD-10-CM | POA: Diagnosis not present

## 2014-07-26 DIAGNOSIS — I824Z1 Acute embolism and thrombosis of unspecified deep veins of right distal lower extremity: Secondary | ICD-10-CM | POA: Diagnosis not present

## 2014-07-26 DIAGNOSIS — M79651 Pain in right thigh: Secondary | ICD-10-CM | POA: Diagnosis not present

## 2014-07-26 DIAGNOSIS — R600 Localized edema: Secondary | ICD-10-CM | POA: Diagnosis not present

## 2014-08-01 ENCOUNTER — Encounter (HOSPITAL_COMMUNITY): Payer: Self-pay | Admitting: Emergency Medicine

## 2014-08-01 ENCOUNTER — Inpatient Hospital Stay (HOSPITAL_COMMUNITY)
Admission: EM | Admit: 2014-08-01 | Discharge: 2014-08-03 | DRG: 065 | Disposition: A | Discharge status: Home / Self Care | Payer: Medicare Other | Attending: Family Medicine | Admitting: Family Medicine

## 2014-08-01 ENCOUNTER — Emergency Department (HOSPITAL_COMMUNITY): Payer: Medicare Other

## 2014-08-01 DIAGNOSIS — I6501 Occlusion and stenosis of right vertebral artery: Secondary | ICD-10-CM | POA: Diagnosis not present

## 2014-08-01 DIAGNOSIS — R29810 Facial weakness: Secondary | ICD-10-CM | POA: Diagnosis present

## 2014-08-01 DIAGNOSIS — Z79899 Other long term (current) drug therapy: Secondary | ICD-10-CM | POA: Diagnosis not present

## 2014-08-01 DIAGNOSIS — I6523 Occlusion and stenosis of bilateral carotid arteries: Secondary | ICD-10-CM | POA: Diagnosis not present

## 2014-08-01 DIAGNOSIS — I5032 Chronic diastolic (congestive) heart failure: Secondary | ICD-10-CM | POA: Diagnosis present

## 2014-08-01 DIAGNOSIS — G4733 Obstructive sleep apnea (adult) (pediatric): Secondary | ICD-10-CM | POA: Diagnosis present

## 2014-08-01 DIAGNOSIS — I739 Peripheral vascular disease, unspecified: Secondary | ICD-10-CM | POA: Diagnosis present

## 2014-08-01 DIAGNOSIS — I252 Old myocardial infarction: Secondary | ICD-10-CM | POA: Diagnosis not present

## 2014-08-01 DIAGNOSIS — I2510 Atherosclerotic heart disease of native coronary artery without angina pectoris: Secondary | ICD-10-CM | POA: Diagnosis present

## 2014-08-01 DIAGNOSIS — I638 Other cerebral infarction: Secondary | ICD-10-CM | POA: Diagnosis not present

## 2014-08-01 DIAGNOSIS — J45909 Unspecified asthma, uncomplicated: Secondary | ICD-10-CM | POA: Diagnosis present

## 2014-08-01 DIAGNOSIS — I6789 Other cerebrovascular disease: Secondary | ICD-10-CM | POA: Diagnosis not present

## 2014-08-01 DIAGNOSIS — I5033 Acute on chronic diastolic (congestive) heart failure: Secondary | ICD-10-CM | POA: Diagnosis not present

## 2014-08-01 DIAGNOSIS — R471 Dysarthria and anarthria: Secondary | ICD-10-CM | POA: Diagnosis present

## 2014-08-01 DIAGNOSIS — I672 Cerebral atherosclerosis: Secondary | ICD-10-CM | POA: Diagnosis present

## 2014-08-01 DIAGNOSIS — M6289 Other specified disorders of muscle: Secondary | ICD-10-CM | POA: Diagnosis not present

## 2014-08-01 DIAGNOSIS — E6609 Other obesity due to excess calories: Secondary | ICD-10-CM | POA: Diagnosis present

## 2014-08-01 DIAGNOSIS — I639 Cerebral infarction, unspecified: Secondary | ICD-10-CM

## 2014-08-01 DIAGNOSIS — Z6834 Body mass index (BMI) 34.0-34.9, adult: Secondary | ICD-10-CM | POA: Diagnosis not present

## 2014-08-01 DIAGNOSIS — Z86718 Personal history of other venous thrombosis and embolism: Secondary | ICD-10-CM

## 2014-08-01 DIAGNOSIS — I10 Essential (primary) hypertension: Secondary | ICD-10-CM | POA: Diagnosis present

## 2014-08-01 DIAGNOSIS — I9589 Other hypotension: Secondary | ICD-10-CM | POA: Diagnosis not present

## 2014-08-01 DIAGNOSIS — R531 Weakness: Secondary | ICD-10-CM | POA: Diagnosis present

## 2014-08-01 DIAGNOSIS — Z7902 Long term (current) use of antithrombotics/antiplatelets: Secondary | ICD-10-CM

## 2014-08-01 DIAGNOSIS — Z86711 Personal history of pulmonary embolism: Secondary | ICD-10-CM | POA: Diagnosis not present

## 2014-08-01 DIAGNOSIS — I6330 Cerebral infarction due to thrombosis of unspecified cerebral artery: Secondary | ICD-10-CM | POA: Diagnosis not present

## 2014-08-01 DIAGNOSIS — I959 Hypotension, unspecified: Secondary | ICD-10-CM | POA: Diagnosis not present

## 2014-08-01 DIAGNOSIS — J449 Chronic obstructive pulmonary disease, unspecified: Secondary | ICD-10-CM | POA: Diagnosis present

## 2014-08-01 DIAGNOSIS — Z8673 Personal history of transient ischemic attack (TIA), and cerebral infarction without residual deficits: Secondary | ICD-10-CM | POA: Diagnosis not present

## 2014-08-01 DIAGNOSIS — I951 Orthostatic hypotension: Secondary | ICD-10-CM | POA: Diagnosis not present

## 2014-08-01 DIAGNOSIS — I63231 Cerebral infarction due to unspecified occlusion or stenosis of right carotid arteries: Principal | ICD-10-CM | POA: Diagnosis present

## 2014-08-01 DIAGNOSIS — R05 Cough: Secondary | ICD-10-CM | POA: Diagnosis not present

## 2014-08-01 DIAGNOSIS — Z8249 Family history of ischemic heart disease and other diseases of the circulatory system: Secondary | ICD-10-CM

## 2014-08-01 DIAGNOSIS — E785 Hyperlipidemia, unspecified: Secondary | ICD-10-CM | POA: Diagnosis present

## 2014-08-01 DIAGNOSIS — I63411 Cerebral infarction due to embolism of right middle cerebral artery: Secondary | ICD-10-CM | POA: Insufficient documentation

## 2014-08-01 DIAGNOSIS — R29898 Other symptoms and signs involving the musculoskeletal system: Secondary | ICD-10-CM | POA: Diagnosis not present

## 2014-08-01 DIAGNOSIS — M109 Gout, unspecified: Secondary | ICD-10-CM | POA: Diagnosis present

## 2014-08-01 DIAGNOSIS — F17290 Nicotine dependence, other tobacco product, uncomplicated: Secondary | ICD-10-CM | POA: Diagnosis present

## 2014-08-01 DIAGNOSIS — N179 Acute kidney failure, unspecified: Secondary | ICD-10-CM | POA: Diagnosis not present

## 2014-08-01 DIAGNOSIS — Z66 Do not resuscitate: Secondary | ICD-10-CM | POA: Diagnosis present

## 2014-08-01 DIAGNOSIS — F419 Anxiety disorder, unspecified: Secondary | ICD-10-CM | POA: Diagnosis present

## 2014-08-01 DIAGNOSIS — I6521 Occlusion and stenosis of right carotid artery: Secondary | ICD-10-CM | POA: Diagnosis not present

## 2014-08-01 DIAGNOSIS — K219 Gastro-esophageal reflux disease without esophagitis: Secondary | ICD-10-CM | POA: Diagnosis present

## 2014-08-01 LAB — COMPREHENSIVE METABOLIC PANEL
ALT: 34 U/L (ref 17–63)
AST: 28 U/L (ref 15–41)
Albumin: 3.9 g/dL (ref 3.5–5.0)
Alkaline Phosphatase: 67 U/L (ref 38–126)
Anion gap: 10 (ref 5–15)
BUN: 23 mg/dL — ABNORMAL HIGH (ref 6–20)
CHLORIDE: 103 mmol/L (ref 101–111)
CO2: 25 mmol/L (ref 22–32)
Calcium: 10.1 mg/dL (ref 8.9–10.3)
Creatinine, Ser: 1.76 mg/dL — ABNORMAL HIGH (ref 0.61–1.24)
GFR calc Af Amer: 45 mL/min — ABNORMAL LOW (ref >60)
GFR calc non Af Amer: 39 mL/min — ABNORMAL LOW (ref >60)
Glucose, Bld: 102 mg/dL — ABNORMAL HIGH (ref 65–99)
Potassium: 4.2 mmol/L (ref 3.5–5.1)
Sodium: 138 mmol/L (ref 135–145)
TOTAL PROTEIN: 7.1 g/dL (ref 6.5–8.1)
Total Bilirubin: 0.8 mg/dL (ref 0.3–1.2)

## 2014-08-01 LAB — CBC
HCT: 47.7 % (ref 39.0–52.0)
Hemoglobin: 16.4 g/dL (ref 13.0–17.0)
MCH: 31 pg (ref 26.0–34.0)
MCHC: 34.4 g/dL (ref 30.0–36.0)
MCV: 90.2 fL (ref 78.0–100.0)
PLATELETS: 152 10*3/uL (ref 150–400)
RBC: 5.29 MIL/uL (ref 4.22–5.81)
RDW: 13.6 % (ref 11.5–15.5)
WBC: 8 10*3/uL (ref 4.0–10.5)

## 2014-08-01 LAB — I-STAT CHEM 8, ED
BUN: 27 mg/dL — ABNORMAL HIGH (ref 6–20)
CALCIUM ION: 1.28 mmol/L (ref 1.13–1.30)
Chloride: 101 mmol/L (ref 101–111)
Creatinine, Ser: 1.8 mg/dL — ABNORMAL HIGH (ref 0.61–1.24)
GLUCOSE: 99 mg/dL (ref 65–99)
HCT: 53 % — ABNORMAL HIGH (ref 39.0–52.0)
Hemoglobin: 18 g/dL — ABNORMAL HIGH (ref 13.0–17.0)
POTASSIUM: 4.1 mmol/L (ref 3.5–5.1)
Sodium: 138 mmol/L (ref 135–145)
TCO2: 24 mmol/L (ref 0–100)

## 2014-08-01 LAB — I-STAT TROPONIN, ED: Troponin i, poc: 0.01 ng/mL (ref 0.00–0.08)

## 2014-08-01 LAB — DIFFERENTIAL
BASOS ABS: 0.1 10*3/uL (ref 0.0–0.1)
Basophils Relative: 1 % (ref 0–1)
EOS ABS: 0.3 10*3/uL (ref 0.0–0.7)
Eosinophils Relative: 3 % (ref 0–5)
LYMPHS ABS: 1.9 10*3/uL (ref 0.7–4.0)
Lymphocytes Relative: 24 % (ref 12–46)
Monocytes Absolute: 1.1 10*3/uL — ABNORMAL HIGH (ref 0.1–1.0)
Monocytes Relative: 14 % — ABNORMAL HIGH (ref 3–12)
NEUTROS PCT: 58 % (ref 43–77)
Neutro Abs: 4.7 10*3/uL (ref 1.7–7.7)

## 2014-08-01 LAB — PROTIME-INR
INR: 1 (ref 0.00–1.49)
Prothrombin Time: 13.4 seconds (ref 11.6–15.2)

## 2014-08-01 LAB — APTT: aPTT: 28 seconds (ref 24–37)

## 2014-08-01 LAB — CBG MONITORING, ED: Glucose-Capillary: 95 mg/dL (ref 65–99)

## 2014-08-01 MED ORDER — LORAZEPAM 1 MG PO TABS: 1.0000 mg | ORAL_TABLET | Freq: Four times a day (QID) | ORAL | Status: DC | PRN

## 2014-08-01 MED ORDER — STROKE: EARLY STAGES OF RECOVERY BOOK
Freq: Once | Status: AC
  Administered 2014-08-01: 23:00:00

## 2014-08-01 MED ORDER — FOLIC ACID 1 MG PO TABS
1.0000 mg | ORAL_TABLET | Freq: Every day | ORAL | Status: DC
  Administered 2014-08-02 – 2014-08-03 (×2): 1 mg via ORAL
  Filled 2014-08-01 (×2): qty 1

## 2014-08-01 MED ORDER — ALBUTEROL SULFATE (2.5 MG/3ML) 0.083% IN NEBU: 3.0000 mL | INHALATION_SOLUTION | Freq: Four times a day (QID) | RESPIRATORY_TRACT | Status: DC | PRN

## 2014-08-01 MED ORDER — SODIUM CHLORIDE 0.9 % IV SOLN: 250.0000 mL | INTRAVENOUS | Status: DC | PRN

## 2014-08-01 MED ORDER — SODIUM CHLORIDE 0.9 % IJ SOLN
3.0000 mL | Freq: Two times a day (BID) | INTRAMUSCULAR | Status: DC
  Administered 2014-08-01 – 2014-08-02 (×2): 3 mL via INTRAVENOUS

## 2014-08-01 MED ORDER — THIAMINE HCL 100 MG/ML IJ SOLN
100.0000 mg | Freq: Every day | INTRAMUSCULAR | Status: DC
  Filled 2014-08-01: qty 2

## 2014-08-01 MED ORDER — ADULT MULTIVITAMIN W/MINERALS CH
1.0000 | ORAL_TABLET | Freq: Every day | ORAL | Status: DC
  Administered 2014-08-02 – 2014-08-03 (×2): 1 via ORAL
  Filled 2014-08-01 (×2): qty 1

## 2014-08-01 MED ORDER — SODIUM CHLORIDE 0.9 % IV BOLUS (SEPSIS)
500.0000 mL | Freq: Once | INTRAVENOUS | Status: AC
  Administered 2014-08-01: 500 mL via INTRAVENOUS

## 2014-08-01 MED ORDER — PANTOPRAZOLE SODIUM 40 MG PO TBEC
40.0000 mg | DELAYED_RELEASE_TABLET | Freq: Every day | ORAL | Status: DC
  Administered 2014-08-02 – 2014-08-03 (×2): 40 mg via ORAL
  Filled 2014-08-01 (×2): qty 1

## 2014-08-01 MED ORDER — PNEUMOCOCCAL VAC POLYVALENT 25 MCG/0.5ML IJ INJ
0.5000 mL | INJECTION | INTRAMUSCULAR | Status: AC
  Administered 2014-08-02: 0.5 mL via INTRAMUSCULAR
  Filled 2014-08-01: qty 0.5

## 2014-08-01 MED ORDER — LORAZEPAM 2 MG/ML IJ SOLN
1.0000 mg | Freq: Four times a day (QID) | INTRAMUSCULAR | Status: DC | PRN
Start: 2014-08-01 — End: 2014-08-03

## 2014-08-01 MED ORDER — ENSURE ENLIVE PO LIQD
237.0000 mL | Freq: Two times a day (BID) | ORAL | Status: DC
  Administered 2014-08-02 – 2014-08-03 (×4): 237 mL via ORAL

## 2014-08-01 MED ORDER — TIOTROPIUM BROMIDE MONOHYDRATE 18 MCG IN CAPS
18.0000 ug | ORAL_CAPSULE | Freq: Every day | RESPIRATORY_TRACT | Status: DC
  Administered 2014-08-02 – 2014-08-03 (×2): 18 ug via RESPIRATORY_TRACT
  Filled 2014-08-01: qty 5

## 2014-08-01 MED ORDER — MOMETASONE FURO-FORMOTEROL FUM 200-5 MCG/ACT IN AERO
2.0000 | INHALATION_SPRAY | Freq: Two times a day (BID) | RESPIRATORY_TRACT | Status: DC
  Administered 2014-08-02 – 2014-08-03 (×3): 2 via RESPIRATORY_TRACT
  Filled 2014-08-01: qty 8.8

## 2014-08-01 MED ORDER — SODIUM CHLORIDE 0.9 % IV BOLUS (SEPSIS)
1000.0000 mL | Freq: Once | INTRAVENOUS | Status: AC
  Administered 2014-08-01: 1000 mL via INTRAVENOUS

## 2014-08-01 MED ORDER — VITAMIN B-1 100 MG PO TABS
100.0000 mg | ORAL_TABLET | Freq: Every day | ORAL | Status: DC
  Administered 2014-08-02 – 2014-08-03 (×2): 100 mg via ORAL
  Filled 2014-08-01 (×2): qty 1

## 2014-08-01 MED ORDER — SODIUM CHLORIDE 0.9 % IJ SOLN: 3.0000 mL | INTRAMUSCULAR | Status: DC | PRN

## 2014-08-01 MED ORDER — ATORVASTATIN CALCIUM 40 MG PO TABS
40.0000 mg | ORAL_TABLET | Freq: Every day | ORAL | Status: DC
  Administered 2014-08-02 – 2014-08-03 (×2): 40 mg via ORAL
  Filled 2014-08-01 (×2): qty 1

## 2014-08-01 MED ORDER — GUAIFENESIN 200 MG PO TABS
400.0000 mg | ORAL_TABLET | ORAL | Status: DC | PRN
  Filled 2014-08-01: qty 2

## 2014-08-01 NOTE — ED Notes (Signed)
MD at bedside. 

## 2014-08-01 NOTE — ED Notes (Signed)
Pt eating and drinking without issue.  ?

## 2014-08-01 NOTE — ED Notes (Signed)
Left arm weakness and numbness starting at 1830 last night; denies headache or chest pain. When EMS arrived, was sitting outside and sweating profusely. Initial pressure was 88/60, IV fluids started about 226m in, repeat pressure 124/82. A&O x4, denies pain. Denies any issues with legs; left arm does drift.

## 2014-08-01 NOTE — H&P (Signed)
[facility] Admission History and Physical Service Pager: [phone]  Patient name: [name] Medical record number: [id number] Date of birth: [birth year] Age: 64 y.o. Gender: male  Primary Care Provider: [name], MD Consultants: Neurology Code Status: DNR  Chief Complaint: Recurrent transient right upper extremity weakness  Assessment and Plan: [name] is a 64 y.o. male presenting with recurrent transient right upper extremity weakness found to have a right frontal lobe CVA. PMH is significant for CAD s/p cath, bilateral PAD, HLD, HFpEF, tobacco dependence, HTN, COPD, DVT, PE, s/p fem-pop  # CVA, right frontal lobe: per radiology report, located in a watershed area. Most likely reason for patient's symptoms which are most likely precipitated by decreased blood pressure to the area.  - admit to telemetry, attending Dr. Gwendlyn Deutscher - appreciate neurology recommendations - hold lisinopril - A1C, Lipid panel - atorvastatin '40mg'$  daily - neuro checks - PT/OT eval  # AKI: most likely secondary to prerenal cause. Baseline appears to be below 1. S/p NS boluses in the ED - repeat BMP in AM  # COPD: Patient has not been taking Dulera or Spiriva. He has been taking albuterol. Exam only significant for mild wheezing. Currently on room air and comfortable. Chest x-ray clear. - start guaifenesin '400mg'$  q4hrs PRN - continue home Dulera - continue home Spiriva - continue home Albuterol  # HFpEF: EF of 60-65% in 2014. Does not appear overloaded. BNP of 42.4 on admission - hold home lasix  # Hypertension: blood pressure controlled - hold lisinopril as above  # Hx of DVT and PE: was on lifelong Xarelto. Has not taken since about March or April secondary to rectal bleeding. - hold xarelto  # Tobacco dependence: currently smokes 1.5 cigars daily - nicotine patch '14mg'$  - CSW consult  # Hx of polysubstance abuse (alcohol/cocaine most recently): last  drink stated to be around the 4th of June. States he used to drink a pint of vodka twice a month before then. States he has not used any other drugs since he was young in his teens - CIWA - thiamine - multivitamin - folate - CSW consult as above  # OSA: history of CPAP usage at home, although currently not using because of improper fit - CPAP qhs  FEN/GI: Heart healthy s/p pass of swallow screen, 1/2NS @ 142m/hr Prophylaxis: SCD, left leg  Disposition: Admit to inpatient, attending Dr. EGwendlyn Deutscher History of Present Illness: Kenneth CASANASis a 64y.o. male presenting with left arm weakness and numbness. Symptoms started last night. First episode lasted a until he woke up the next day and symptoms resolved completely. Today, around 11:30AM, he was at his friend's house smoking and he felt lightheaded with recurrence of left arm numbness and weakness that radiated to his neck. His friend recommended he go to the ED. No associated chest or shortness of breath. No fevers, chills. No nausea, vomiting, or abdominal pain.  Upon arrival to the ED, labs remarkable for AKI with a creatinine of 1.8. BNP was 42.4. Troponin negative. EKG NSR. MRI significant for 5 punctate right frontal lobe lesions around a watershed area. He passed a swallow screen in the ED. Neurology was consulted along with medicine for admission.  States he has a possible history of MI about 15-20 years ago and had an "ED/ICU" admission. Per chart review, he has a history of fem-pop bypass in 2012  Review Of Systems: Per HPI with the following additions: None Otherwise 12 point  review of systems was performed and was unremarkable.  Patient Active Problem List   Diagnosis Date Noted  . CVA (cerebral infarction) 08/01/2014  . Alcohol dependence with withdrawal with complication 38/11/1749  . DVT (deep venous thrombosis) 02/22/2014  . Lower extremity edema 02/21/2014  . Diastolic CHF, acute on chronic 11/21/2013  . CAD  (coronary artery disease) 11/21/2013  . Alcohol abuse 11/21/2013  . GERD (gastroesophageal reflux disease) 08/10/2013  . Gout 08/10/2013  . Tobacco abuse 07/26/2013  . Dizziness 04/10/2013  . Binocular vision disorder with diplopia 01/03/2013  . Fracture of inferior orbital wall 01/03/2013  . Peripheral vascular disease 07/27/2012  . COPD (chronic obstructive pulmonary disease) 04/04/2012  . HTN (hypertension) 04/04/2012   Past Medical History: Past Medical History  Diagnosis Date  . Depression   . Hypertension   . Hyperlipidemia   . Myocardial infarction 2000  . CHF (congestive heart failure)   . COPD (chronic obstructive pulmonary disease)   . Peripheral vascular disease, unspecified     08/20/10 doppler: increase in right ABI post-op. Left ABI stable. S/P bi-fem bypass surgery  . GERD (gastroesophageal reflux disease)   . Asthma   . Anxiety   . History of DVT of lower extremity   . CAD (coronary artery disease) 05/27/10    Cath: severe single vessell CAD left cx midportion obtuse marginal 2 to 3.  . Shortness of breath   . Pneumonia 04/04/2012  . Arthritis   . OSA on CPAP   . Hyperlipemia   . COPD (chronic obstructive pulmonary disease)   . Alcohol abuse     H/O  . Tobacco abuse   . Orbital fracture 12/2012   Past Surgical History: Past Surgical History  Procedure Laterality Date  . Femoral bypass  08/19/10    Right Fem-Pop  . Aortogram w/ ptca  09/292003  . Eye surgery    . Back surgery    . Total knee arthroplasty    . Cholecystectomy    . Cardiac catheterization  05/27/10    severe CAD left cx  . Abdoninal ao angio & bifem angio  05/27/10    Patent graft, occluded bil stents with no retrograde flow into the hypogastric arteries. 100% occl left ant. tibial artery, 70% to 80% to 100% stenosis right superficial fem artery above adductor canal. 100 % occl right ant. tibial vessell  . Esophagogastric fundoplication     Social History: History  Substance Use  Topics  . Smoking status: Current Every Day Smoker -- 1.00 packs/day    Types: Cigars    Start date: 02/09/1961  . Smokeless tobacco: Never Used     Comment: 2 ppd, full flavor  . Alcohol Use: Yes     Comment: occ   Additional social history: None  Please also refer to relevant sections of EMR.  Family History: Family History  Problem Relation Age of Onset  . Heart attack Mother   . Cirrhosis Father   . Heart failure Mother   . Heart failure Brother   . Cancer Brother    Allergies and Medications: Allergies  Allergen Reactions  . Darvocet [Propoxyphene N-Acetaminophen] Hives  . Haldol [Haloperidol Decanoate] Hives   No current facility-administered medications on file prior to encounter.   Current Outpatient Prescriptions on File Prior to Encounter  Medication Sig Dispense Refill  . albuterol (PROVENTIL HFA;VENTOLIN HFA) 108 (90 BASE) MCG/ACT inhaler Inhale 2 puffs into the lungs every 6 (six) hours as needed for wheezing or shortness of breath.  1 Inhaler 3  . clotrimazole-betamethasone (LOTRISONE) cream Apply 1 application topically 2 (two) times daily. 30 g 0  . furosemide (LASIX) 40 MG tablet Take 1 tablet (40 mg total) by mouth daily. 30 tablet 3  . lisinopril (PRINIVIL,ZESTRIL) 20 MG tablet Take 0.5 tablets (10 mg total) by mouth daily. 90 tablet 3  . Multiple Vitamins-Minerals (MULTIVITAMIN ADULT PO) Take 1 tablet by mouth daily.    [name] omeprazole (PRILOSEC) 20 MG capsule Take 1 capsule (20 mg total) by mouth daily. 30 capsule 11  . Pyridoxine HCl (VITAMIN B-6 PO) Take 1 tablet by mouth daily.    [name] Spacer/Aero-Holding Chambers (BREATHERITE COLL SPACER ADULT) MISC 1 Device by Does not apply route as needed (with inhaler). 1 each 0  . tiotropium (SPIRIVA) 18 MCG inhalation capsule Place 1 capsule (18 mcg total) into inhaler and inhale daily. 30 capsule 3  . vitamin B-12 (CYANOCOBALAMIN) 100 MCG tablet Take 100 mcg by mouth daily.    . vitamin E 200 UNIT capsule Take 200  Units by mouth daily.    . mometasone-formoterol (DULERA) 200-5 MCG/ACT AERO Inhale 2 puffs into the lungs 2 (two) times daily. (Patient not taking: Reported on 06/18/2014) 13 g 3  . nortriptyline (PAMELOR) 25 MG capsule Take 1 capsule (25 mg total) by mouth at bedtime. (Patient not taking: Reported on 06/18/2014) 30 capsule 1  . oxyCODONE-acetaminophen (PERCOCET) 5-325 MG per tablet Take 2 tablets by mouth every 6 (six) hours as needed for moderate pain. (Patient not taking: Reported on 06/18/2014) 12 tablet 0  . potassium chloride (K-DUR) 10 MEQ tablet Take 2 tablets (20 mEq total) by mouth daily. (Patient not taking: Reported on 06/18/2014) 30 tablet 0  . predniSONE (DELTASONE) 50 MG tablet Take 1 tablet (50 mg total) by mouth daily. For 5 days. (Patient not taking: Reported on 02/21/2014) 5 tablet 0  . Rivaroxaban (XARELTO STARTER PACK) 15 & 20 MG TBPK '15mg'$  tablet by mouth twice a day with food change to 20 mg tab once daily on day 22. (Patient not taking: Reported on 06/18/2014) 51 each 0    Objective: BP 124/69 mmHg  Pulse 79  Temp(Src) 97.7 F (36.5 C) (Oral)  Resp 19  SpO2 97% Exam: General: Well appearing, laying down, no distress Eyes: anicteric, PERRL ENTM: Oropharynx clear with slightly moist mucous membranes Neck: No adenopathy Cardiovascular: Distant heart sounds. Too faint to assess rhythm or heart sounds. 1+ PD pulses bilaterally and could not palpate PT pulses bilaterally Respiratory: end-expiratory wheezes diffusely with prolonged expiratory phase. No crackles. No increased work of breathing Abdomen: Soft, large, non-tender, could not feel for underlying organs.  MSK: 1+ pitting edema bilaterally Skin: Midline incisions scar located on abdomen extending from below diaphragm down to pubic area Neuro: Alert, oriented  Cranial Nerves II - XII - II - Visual field intact OU. III, IV, VI - Extraocular movements intact. V - Facial sensation intact bilaterally. VII - Facial movement  intact bilaterally. VIII - Hearing diminished X - Palate elevates symmetrically, no dysarthria. XI - Shoulder shrug intact bilaterally. XII - Tongue protrusion intact.  Motor Strength - The patient's strength was 5/5 in all extremities and pronator drift was absent. Bulk was normal and fasciculations were absent.  Motor Tone - Muscle tone was assessed at the neck and appendages and was normal.  Sensory - Light touch was assessed and were symmetrical.   Coordination - The patient had normal movements in the hands with no ataxia or dysmetria. Tremor was  absent.  Labs and Imaging: CBC BMET   Recent Labs Lab 08/01/14 1550 08/01/14 1606  WBC 8.0  --   HGB 16.4 18.0*  HCT 47.7 53.0*  PLT 152  --     Recent Labs Lab 08/01/14 1550 08/01/14 1606  NA 138 138  K 4.2 4.1  CL 103 101  CO2 25  --   BUN 23* 27*  CREATININE 1.76* 1.80*  GLUCOSE 102* 99  CALCIUM 10.1  --      Dg Chest 2 View  08/01/2014   CLINICAL DATA:  Left arm weakness and numbness. Productive cough for 3 weeks.  EXAM: CHEST  2 VIEW  COMPARISON:  06/23/2014 and 06/18/2014  FINDINGS: Heart size and pulmonary vascularity are normal. The lungs are clear. There is a hiatal hernia. The CT scan of 12/12/2012 demonstrated that the Nissen fundoplication is herniated above the diaphragm. The wrap was intact at that time.  No acute osseous abnormality.  IMPRESSION: No acute abnormality.   Electronically Signed   By: Lorriane Shire M.D.   On: 08/01/2014 16:36   Ct Head (brain) Wo Contrast  08/01/2014   CLINICAL DATA:  Left arm weakness starting last night  EXAM: CT HEAD WITHOUT CONTRAST  TECHNIQUE: Contiguous axial images were obtained from the base of the skull through the vertex without intravenous contrast.  COMPARISON:  04/10/2013  FINDINGS: No skull fracture is noted. Paranasal sinuses shows mucosal thickening left maxillary sinus. Stable postsurgical changes left orbital floor and medial orbit.  Mild atherosclerotic  calcifications of carotid siphon.  No intracranial hemorrhage, mass effect or midline shift. Stable cerebral atrophy. Stable mild subcortical chronic white matter disease. No definite evidence of acute cortical infarction. No mass lesion is noted on this unenhanced scan. Ventricular size is stable from prior exam.  IMPRESSION: No acute intracranial abnormality. Stable chronic changes as described above.   Electronically Signed   By: Lahoma Crocker M.D.   On: 08/01/2014 16:22   Mr Brain Wo Contrast  08/01/2014   CLINICAL DATA:  Acute onset of left arm weakness and numbness beginning at 18 30 last evening. The patient was diaphoretic and hypotensive at the time.  EXAM: MRI HEAD WITHOUT CONTRAST  TECHNIQUE: Multiplanar, multiecho pulse sequences of the brain and surrounding structures were obtained without intravenous contrast.  COMPARISON:  CT head without contrast from the same day. MRI brain 04/11/2013.  FINDINGS: At least 5 different punctate foci of restricted diffusion are present within the right frontal lobe along a watershed distribution between the [facility] and MCA territories.  Moderate generalized atrophy is present. Moderate periventricular and scattered subcortical T2 hyperintensities are present bilaterally. No acute hemorrhage is present. A 7 mm dural-based lesion over the left frontal convexity likely represents a small meningioma.  Flow is present in the major intracranial arteries. A right lens replacement is noted. The globes and orbits are otherwise intact. Chronic left ethmoid and maxillary sinus opacification is noted. The paranasal sinuses are otherwise clear. The mastoid air cells are clear.  Skullbase is unremarkable. Midline structures demonstrate fusion across the C2-3 disc space. Intracranial structures are within normal limits.  IMPRESSION: 1. At least 5 punctate nonhemorrhagic infarcts over the right frontal lobe within a watershed distribution between the [place] and MCA territories. This  corresponds with the hypotensive episode and left arm symptoms. 2. Moderate atrophy and diffuse white matter disease is advanced for age. This likely reflects the sequela of chronic microvascular ischemia. 3. 7 mm dural-based lesion over the left frontal  convexity likely represents a meningioma. 4. Chronic left ethmoid and maxillary sinus disease. These results were called by telephone at the time of interpretation on 08/01/2014 at 9:09 pm to Dr. Roderic Palau , who verbally acknowledged these results.   Electronically Signed   By: San Morelle M.D.   On: 08/01/2014 21:09    Mariel Aloe, MD 08/01/2014, 9:34 PM PGY-2, [place] [facility] pager: [phone], text pages welcome

## 2014-08-01 NOTE — ED Provider Notes (Signed)
CSN: [id number]     Arrival date & time 08/01/14  1528 History   First MD Initiated Contact with Patient 08/01/14 1613     Chief Complaint  Patient presents with  . Extremity Weakness     (Consider location/radiation/quality/duration/timing/severity/associated sxs/prior Treatment) HPI Patient presents to the emergency department with numbness in his left arm and hand.  The patient states that last night he started having some numbness in his left arm and hand and states that he went to bed.  He woke up this morning and felt like the symptoms may be better, but around 11:30, noticed that they did were still present and felt like that.  They are waxing and waning to some degree felt normal at times, but then felt numb again.  The patient states that he does not have any chest pain, shortness of breath, nausea, vomiting, headache, blurred vision, back pain, neck pain, fever, cough, abdominal pain, diarrhea, lightheadedness, near syncope or syncope.  Patient states nothing seems make his condition, better or worse Past Medical History  Diagnosis Date  . Depression   . Hypertension   . Hyperlipidemia   . Myocardial infarction 2000  . CHF (congestive heart failure)   . COPD (chronic obstructive pulmonary disease)   . Peripheral vascular disease, unspecified     08/20/10 doppler: increase in right ABI post-op. Left ABI stable. S/P bi-fem bypass surgery  . GERD (gastroesophageal reflux disease)   . Asthma   . Anxiety   . History of DVT of lower extremity   . CAD (coronary artery disease) 05/27/10    Cath: severe single vessell CAD left cx midportion obtuse marginal 2 to 3.  . Shortness of breath   . Pneumonia 04/04/2012  . Arthritis   . OSA on CPAP   . Hyperlipemia   . COPD (chronic obstructive pulmonary disease)   . Alcohol abuse     H/O  . Tobacco abuse   . Orbital fracture 12/2012   Past Surgical History  Procedure Laterality Date  . Femoral bypass  08/19/10    Right Fem-Pop  .  Aortogram w/ ptca  09/292003  . Eye surgery    . Back surgery    . Total knee arthroplasty    . Cholecystectomy    . Cardiac catheterization  05/27/10    severe CAD left cx  . Abdoninal ao angio & bifem angio  05/27/10    Patent graft, occluded bil stents with no retrograde flow into the hypogastric arteries. 100% occl left ant. tibial artery, 70% to 80% to 100% stenosis right superficial fem artery above adductor canal. 100 % occl right ant. tibial vessell  . Esophagogastric fundoplication     Family History  Problem Relation Age of Onset  . Heart attack Mother   . Cirrhosis Father   . Heart failure Mother   . Heart failure Brother   . Cancer Brother    History  Substance Use Topics  . Smoking status: Current Every Day Smoker -- 1.00 packs/day    Types: Cigars    Start date: 02/09/1961  . Smokeless tobacco: Never Used     Comment: 2 ppd, full flavor  . Alcohol Use: Yes     Comment: occ    Review of Systems  All other systems negative except as documented in the HPI. All pertinent positives and negatives as reviewed in the HPI.  Allergies  Darvocet and Haldol  Home Medications   Prior to Admission medications   Medication Sig Start  Date End Date Taking? Authorizing Provider  albuterol (PROVENTIL HFA;VENTOLIN HFA) 108 (90 BASE) MCG/ACT inhaler Inhale 2 puffs into the lungs every 6 (six) hours as needed for wheezing or shortness of breath. 11/21/13  Yes Coral Spikes, DO  clotrimazole-betamethasone (LOTRISONE) cream Apply 1 application topically 2 (two) times daily. 02/21/14  Yes Timmothy Euler, MD  furosemide (LASIX) 40 MG tablet Take 1 tablet (40 mg total) by mouth daily. 02/21/14  Yes Timmothy Euler, MD  lisinopril (PRINIVIL,ZESTRIL) 20 MG tablet Take 0.5 tablets (10 mg total) by mouth daily. 02/23/14  Yes Hilton Sinclair, MD  Multiple Vitamins-Minerals (MULTIVITAMIN ADULT PO) Take 1 tablet by mouth daily.   Yes Historical Provider, MD  omeprazole (PRILOSEC) 20 MG  capsule Take 1 capsule (20 mg total) by mouth daily. 02/21/14  Yes Timmothy Euler, MD  Pyridoxine HCl (VITAMIN B-6 PO) Take 1 tablet by mouth daily.   Yes Historical Provider, MD  Spacer/Aero-Holding Chambers (BREATHERITE COLL SPACER ADULT) MISC 1 Device by Does not apply route as needed (with inhaler). 07/26/13  Yes Timmothy Euler, MD  tiotropium (SPIRIVA) 18 MCG inhalation capsule Place 1 capsule (18 mcg total) into inhaler and inhale daily. 11/21/13  Yes Coral Spikes, DO  vitamin B-12 (CYANOCOBALAMIN) 100 MCG tablet Take 100 mcg by mouth daily.   Yes Historical Provider, MD  vitamin E 200 UNIT capsule Take 200 Units by mouth daily.   Yes Historical Provider, MD  mometasone-formoterol (DULERA) 200-5 MCG/ACT AERO Inhale 2 puffs into the lungs 2 (two) times daily. Patient not taking: Reported on 06/18/2014 11/21/13   Coral Spikes, DO  nortriptyline (PAMELOR) 25 MG capsule Take 1 capsule (25 mg total) by mouth at bedtime. Patient not taking: Reported on 06/18/2014 08/03/13   Zenia Resides, MD  oxyCODONE-acetaminophen (PERCOCET) 5-325 MG per tablet Take 2 tablets by mouth every 6 (six) hours as needed for moderate pain. Patient not taking: Reported on 06/18/2014 06/05/13   Wandra Arthurs, MD  potassium chloride (K-DUR) 10 MEQ tablet Take 2 tablets (20 mEq total) by mouth daily. Patient not taking: Reported on 06/18/2014 11/21/13   Coral Spikes, DO  predniSONE (DELTASONE) 50 MG tablet Take 1 tablet (50 mg total) by mouth daily. For 5 days. Patient not taking: Reported on 02/21/2014 11/23/13   Coral Spikes, DO  Rivaroxaban (XARELTO STARTER PACK) 15 & 20 MG TBPK '15mg'$  tablet by mouth twice a day with food change to 20 mg tab once daily on day 22. Patient not taking: Reported on 06/18/2014 02/22/14   Timmothy Euler, MD   BP 111/49 mmHg  Pulse 80  Temp(Src) 97.7 F (36.5 C) (Oral)  Resp 24  SpO2 97% Physical Exam  Constitutional: He is oriented to person, place, and time. He appears well-developed and  well-nourished. No distress.  HENT:  Head: Normocephalic and atraumatic.  Mouth/Throat: Oropharynx is clear and moist.  Eyes: Pupils are equal, round, and reactive to light.  Neck: Normal range of motion. Neck supple.  Cardiovascular: Normal rate, regular rhythm and normal heart sounds.  Exam reveals no gallop and no friction rub.   No murmur heard. Pulmonary/Chest: Effort normal and breath sounds normal. No respiratory distress.  Neurological: He is alert and oriented to person, place, and time. He exhibits normal muscle tone. Coordination normal.  Skin: Skin is warm and dry. No rash noted. No erythema.  Psychiatric: He has a normal mood and affect. His behavior is normal.  Nursing note and  vitals reviewed.   ED Course  Procedures (including critical care time) Labs Review Labs Reviewed  DIFFERENTIAL - Abnormal; Notable for the following:    Monocytes Relative 14 (*)    Monocytes Absolute 1.1 (*)    All other components within normal limits  COMPREHENSIVE METABOLIC PANEL - Abnormal; Notable for the following:    Glucose, Bld 102 (*)    BUN 23 (*)    Creatinine, Ser 1.76 (*)    GFR calc non Af Amer 39 (*)    GFR calc Af Amer 45 (*)    All other components within normal limits  I-STAT CHEM 8, ED - Abnormal; Notable for the following:    BUN 27 (*)    Creatinine, Ser 1.80 (*)    Hemoglobin 18.0 (*)    HCT 53.0 (*)    All other components within normal limits  PROTIME-INR  APTT  CBC  I-STAT TROPOININ, ED  CBG MONITORING, ED    Imaging Review Dg Chest 2 View  08/01/2014   CLINICAL DATA:  Left arm weakness and numbness. Productive cough for 3 weeks.  EXAM: CHEST  2 VIEW  COMPARISON:  06/23/2014 and 06/18/2014  FINDINGS: Heart size and pulmonary vascularity are normal. The lungs are clear. There is a hiatal hernia. The CT scan of 12/12/2012 demonstrated that the Nissen fundoplication is herniated above the diaphragm. The wrap was intact at that time.  No acute osseous  abnormality.  IMPRESSION: No acute abnormality.   Electronically Signed   By: Lorriane Shire M.D.   On: 08/01/2014 16:36   Ct Head (brain) Wo Contrast  08/01/2014   CLINICAL DATA:  Left arm weakness starting last night  EXAM: CT HEAD WITHOUT CONTRAST  TECHNIQUE: Contiguous axial images were obtained from the base of the skull through the vertex without intravenous contrast.  COMPARISON:  04/10/2013  FINDINGS: No skull fracture is noted. Paranasal sinuses shows mucosal thickening left maxillary sinus. Stable postsurgical changes left orbital floor and medial orbit.  Mild atherosclerotic calcifications of carotid siphon.  No intracranial hemorrhage, mass effect or midline shift. Stable cerebral atrophy. Stable mild subcortical chronic white matter disease. No definite evidence of acute cortical infarction. No mass lesion is noted on this unenhanced scan. Ventricular size is stable from prior exam.  IMPRESSION: No acute intracranial abnormality. Stable chronic changes as described above.   Electronically Signed   By: Lahoma Crocker M.D.   On: 08/01/2014 16:22   Patient will be admitted to the hospital for further evaluation and care.  He most likely had an embolic events during his hypotensive episodes, likely due to dehydration.  Spoke with neurology in the family practice resident who will see the patient     Dalia Heading, Hershal Coria 08/02/14 [place], MD 08/03/14 [phone]

## 2014-08-01 NOTE — ED Notes (Signed)
Patient transported to X-ray 

## 2014-08-01 NOTE — ED Notes (Signed)
Pt returned from MRI °

## 2014-08-01 NOTE — ED Notes (Signed)
Pt transported to MRI 

## 2014-08-01 NOTE — Consult Note (Signed)
Admission H&P    Chief Complaint: New onset left sided weakness.  HPI: [name] is an 64 y.o. male with a history of hypertension, hyperlipidemia, myocardial infarction, COPD, congestive heart failure, alcohol abuse and tobacco abuse, as well as DVT and poor compliance with taking anti-coagulation medication, taking with weakness involving upper extremity and slurred speech. Symptoms began about 6:30 evening of 07/31/2014. Symptoms worsened this afternoon around 1 PM. ET scan of his head was unremarkable. MRI showed multiple right frontal ischemic infarctions with a pattern consistent with watershed, MCA/ACA strokes. She also complained of being lightheaded and was hypotensive systolic blood pressure of 80 when he presented to the emergency room. He improved following mission. NIH stroke score at the time of this evaluation was 0.  LSN: 6:30 PM on 07/31/2014 tPA Given: No: Beyond time window for treatment consideration mRankin:  Past Medical History  Diagnosis Date  . Depression   . Hypertension   . Hyperlipidemia   . Myocardial infarction 2000  . CHF (congestive heart failure)   . COPD (chronic obstructive pulmonary disease)   . Peripheral vascular disease, unspecified     08/20/10 doppler: increase in right ABI post-op. Left ABI stable. S/P bi-fem bypass surgery  . GERD (gastroesophageal reflux disease)   . Asthma   . Anxiety   . History of DVT of lower extremity   . CAD (coronary artery disease) 05/27/10    Cath: severe single vessell CAD left cx midportion obtuse marginal 2 to 3.  . Shortness of breath   . Pneumonia 04/04/2012  . Arthritis   . OSA on CPAP   . Hyperlipemia   . COPD (chronic obstructive pulmonary disease)   . Alcohol abuse     H/O  . Tobacco abuse   . Orbital fracture 12/2012    Past Surgical History  Procedure Laterality Date  . Femoral bypass  08/19/10    Right Fem-Pop  . Aortogram w/ ptca  09/292003  . Eye surgery    . Back surgery    . Total  knee arthroplasty    . Cholecystectomy    . Cardiac catheterization  05/27/10    severe CAD left cx  . Abdoninal ao angio & bifem angio  05/27/10    Patent graft, occluded bil stents with no retrograde flow into the hypogastric arteries. 100% occl left ant. tibial artery, 70% to 80% to 100% stenosis right superficial fem artery above adductor canal. 100 % occl right ant. tibial vessell  . Esophagogastric fundoplication      Family History  Problem Relation Age of Onset  . Heart attack Mother   . Cirrhosis Father   . Heart failure Mother   . Heart failure Brother   . Cancer Brother    Social History:  reports that he has been smoking Cigars and Cigarettes.  He started smoking about 53 years ago. He has a 50 pack-year smoking history. He has quit using smokeless tobacco. His smokeless tobacco use included Chew. He reports that he drinks alcohol. He reports that he does not use illicit drugs.  Allergies:  Allergies  Allergen Reactions  . Darvocet [Propoxyphene N-Acetaminophen] Hives  . Haldol [Haloperidol Decanoate] Hives    Medications Prior to Admission  Medication Sig Dispense Refill  . albuterol (PROVENTIL HFA;VENTOLIN HFA) 108 (90 BASE) MCG/ACT inhaler Inhale 2 puffs into the lungs every 6 (six) hours as needed for wheezing or shortness of breath. 1 Inhaler 3  . clotrimazole-betamethasone (LOTRISONE) cream Apply 1 application topically 2 (  two) times daily. 30 g 0  . furosemide (LASIX) 40 MG tablet Take 1 tablet (40 mg total) by mouth daily. 30 tablet 3  . lisinopril (PRINIVIL,ZESTRIL) 20 MG tablet Take 0.5 tablets (10 mg total) by mouth daily. 90 tablet 3  . Multiple Vitamins-Minerals (MULTIVITAMIN ADULT PO) Take 1 tablet by mouth daily.    [name] omeprazole (PRILOSEC) 20 MG capsule Take 1 capsule (20 mg total) by mouth daily. 30 capsule 11  . Pyridoxine HCl (VITAMIN B-6 PO) Take 1 tablet by mouth daily.    [name] Spacer/Aero-Holding Chambers (BREATHERITE COLL SPACER ADULT) MISC 1 Device by  Does not apply route as needed (with inhaler). 1 each 0  . tiotropium (SPIRIVA) 18 MCG inhalation capsule Place 1 capsule (18 mcg total) into inhaler and inhale daily. 30 capsule 3  . vitamin B-12 (CYANOCOBALAMIN) 100 MCG tablet Take 100 mcg by mouth daily.    . vitamin E 200 UNIT capsule Take 200 Units by mouth daily.    . mometasone-formoterol (DULERA) 200-5 MCG/ACT AERO Inhale 2 puffs into the lungs 2 (two) times daily. (Patient not taking: Reported on 06/18/2014) 13 g 3  . nortriptyline (PAMELOR) 25 MG capsule Take 1 capsule (25 mg total) by mouth at bedtime. (Patient not taking: Reported on 06/18/2014) 30 capsule 1  . oxyCODONE-acetaminophen (PERCOCET) 5-325 MG per tablet Take 2 tablets by mouth every 6 (six) hours as needed for moderate pain. (Patient not taking: Reported on 06/18/2014) 12 tablet 0  . potassium chloride (K-DUR) 10 MEQ tablet Take 2 tablets (20 mEq total) by mouth daily. (Patient not taking: Reported on 06/18/2014) 30 tablet 0  . Rivaroxaban (XARELTO STARTER PACK) 15 & 20 MG TBPK 14m tablet by mouth twice a day with food change to 20 mg tab once daily on day 22. (Patient not taking: Reported on 06/18/2014) 51 each 0  . [DISCONTINUED] predniSONE (DELTASONE) 50 MG tablet Take 1 tablet (50 mg total) by mouth daily. For 5 days. (Patient not taking: Reported on 02/21/2014) 5 tablet 0    ROS: History obtained from the patient  General ROS: negative for - chills, fatigue, fever, night sweats, weight gain or weight loss Psychological ROS: negative for - behavioral disorder, hallucinations, memory difficulties, mood swings or suicidal ideation Ophthalmic ROS: negative for - blurry vision, double vision, eye pain or loss of vision ENT ROS: negative for - epistaxis, nasal discharge, oral lesions, sore throat, tinnitus or vertigo Allergy and Immunology ROS: negative for - hives or itchy/watery eyes Hematological and Lymphatic ROS: negative for - bleeding problems, bruising or swollen lymph  nodes Endocrine ROS: negative for - galactorrhea, hair pattern changes, polydipsia/polyuria or temperature intolerance Respiratory ROS: negative for - cough, hemoptysis, shortness of breath or wheezing Cardiovascular ROS: negative for - chest pain, dyspnea on exertion, edema or irregular heartbeat Gastrointestinal ROS: negative for - abdominal pain, diarrhea, hematemesis, nausea/vomiting or stool incontinence Genito-Urinary ROS: negative for - dysuria, hematuria, incontinence or urinary frequency/urgency Musculoskeletal ROS: negative for - joint swelling or muscular weakness Neurological ROS: as noted in HPI; also complaining of painful burning feet. Dermatological ROS: negative for rash and skin lesion changes  Physical Examination: Blood pressure 141/70, pulse 78, temperature 97.5 F (36.4 C), temperature source Oral, resp. rate 16, height 6' (1.829 m), weight 115.894 kg (255 lb 8 oz), SpO2 99 %.  HEENT-  Normocephalic, no lesions, without obvious abnormality.  Normal external eye and conjunctiva.  Normal TM's bilaterally.  Normal auditory canals and external ears. Normal external nose, mucus  membranes and septum.  Normal pharynx. Neck supple with no masses, nodes, nodules or enlargement. Cardiovascular - regular rate and rhythm, S1, S2 normal, no murmur, click, rub or gallop Lungs - chest clear, no wheezing, rales, normal symmetric air entry Abdomen - soft, non-tender; bowel sounds normal; no masses,  no organomegaly Extremities - no edema and no skin discoloration  Neurologic Examination: Mental Status: Alert, oriented, thought content appropriate.  Speech fluent without evidence of aphasia. Able to follow commands without difficulty. Cranial Nerves: II-Visual fields were normal. III/IV/VI-Pupils were equal and reacted normally to light. Extraocular movements were full and conjugate.    V/VII-no facial numbness and no facial weakness. VIII-normal. X-normal speech and symmetrical  palatal movement. XI: trapezius strength/neck flexion strength normal bilaterally XII-midline tongue extension with normal strength. Motor: 5/5 bilaterally with normal tone and bulk Sensory: Normal throughout. Deep Tendon Reflexes: 1+ and symmetric. Plantars: Mute bilaterally Cerebellar: Normal finger-to-nose testing. Carotid auscultation: Normal  Results for orders placed or performed during the hospital encounter of 08/01/14 (from the past 48 hour(s))  Protime-INR     Status: None   Collection Time: 08/01/14  3:50 PM  Result Value Ref Range   Prothrombin Time 13.4 11.6 - 15.2 seconds   INR 1.00 0.00 - 1.49  APTT     Status: None   Collection Time: 08/01/14  3:50 PM  Result Value Ref Range   aPTT 28 24 - 37 seconds  CBC     Status: None   Collection Time: 08/01/14  3:50 PM  Result Value Ref Range   WBC 8.0 4.0 - 10.5 K/uL   RBC 5.29 4.22 - 5.81 MIL/uL   Hemoglobin 16.4 13.0 - 17.0 g/dL   HCT 47.7 39.0 - 52.0 %   MCV 90.2 78.0 - 100.0 fL   MCH 31.0 26.0 - 34.0 pg   MCHC 34.4 30.0 - 36.0 g/dL   RDW 13.6 11.5 - 15.5 %   Platelets 152 150 - 400 K/uL  Differential     Status: Abnormal   Collection Time: 08/01/14  3:50 PM  Result Value Ref Range   Neutrophils Relative % 58 43 - 77 %   Neutro Abs 4.7 1.7 - 7.7 K/uL   Lymphocytes Relative 24 12 - 46 %   Lymphs Abs 1.9 0.7 - 4.0 K/uL   Monocytes Relative 14 (H) 3 - 12 %   Monocytes Absolute 1.1 (H) 0.1 - 1.0 K/uL   Eosinophils Relative 3 0 - 5 %   Eosinophils Absolute 0.3 0.0 - 0.7 K/uL   Basophils Relative 1 0 - 1 %   Basophils Absolute 0.1 0.0 - 0.1 K/uL  Comprehensive metabolic panel     Status: Abnormal   Collection Time: 08/01/14  3:50 PM  Result Value Ref Range   Sodium 138 135 - 145 mmol/L   Potassium 4.2 3.5 - 5.1 mmol/L   Chloride 103 101 - 111 mmol/L   CO2 25 22 - 32 mmol/L   Glucose, Bld 102 (H) 65 - 99 mg/dL   BUN 23 (H) 6 - 20 mg/dL   Creatinine, Ser 1.76 (H) 0.61 - 1.24 mg/dL   Calcium 10.1 8.9 - 10.3  mg/dL   Total Protein 7.1 6.5 - 8.1 g/dL   Albumin 3.9 3.5 - 5.0 g/dL   AST 28 15 - 41 U/L   ALT 34 17 - 63 U/L   Alkaline Phosphatase 67 38 - 126 U/L   Total Bilirubin 0.8 0.3 - 1.2 mg/dL   GFR  calc non Af Amer 39 (L) >60 mL/min   GFR calc Af Amer 45 (L) >60 mL/min    Comment: (NOTE) The eGFR has been calculated using the CKD EPI equation. This calculation has not been validated in all clinical situations. eGFR's persistently <60 mL/min signify possible Chronic Kidney Disease.    Anion gap 10 5 - 15  CBG monitoring, ED     Status: None   Collection Time: 08/01/14  3:59 PM  Result Value Ref Range   Glucose-Capillary 95 65 - 99 mg/dL  I-stat troponin, ED (not at [facility])     Status: None   Collection Time: 08/01/14  4:03 PM  Result Value Ref Range   Troponin i, poc 0.01 0.00 - 0.08 ng/mL   Comment 3            Comment: Due to the release kinetics of cTnI, a negative result within the first hours of the onset of symptoms does not rule out myocardial infarction with certainty. If myocardial infarction is still suspected, repeat the test at appropriate intervals.   I-Stat Chem 8, ED  (not at [facility])     Status: Abnormal   Collection Time: 08/01/14  4:06 PM  Result Value Ref Range   Sodium 138 135 - 145 mmol/L   Potassium 4.1 3.5 - 5.1 mmol/L   Chloride 101 101 - 111 mmol/L   BUN 27 (H) 6 - 20 mg/dL   Creatinine, Ser 1.80 (H) 0.61 - 1.24 mg/dL   Glucose, Bld 99 65 - 99 mg/dL   Calcium, Ion 1.28 1.13 - 1.30 mmol/L   TCO2 24 0 - 100 mmol/L   Hemoglobin 18.0 (H) 13.0 - 17.0 g/dL   HCT 53.0 (H) 39.0 - 52.0 %   Dg Chest 2 View  08/01/2014   CLINICAL DATA:  Left arm weakness and numbness. Productive cough for 3 weeks.  EXAM: CHEST  2 VIEW  COMPARISON:  06/23/2014 and 06/18/2014  FINDINGS: Heart size and pulmonary vascularity are normal. The lungs are clear. There is a hiatal hernia. The CT scan of 12/12/2012 demonstrated that the Nissen fundoplication is herniated above the  diaphragm. The wrap was intact at that time.  No acute osseous abnormality.  IMPRESSION: No acute abnormality.   Electronically Signed   By: Lorriane Shire M.D.   On: 08/01/2014 16:36   Ct Head (brain) Wo Contrast  08/01/2014   CLINICAL DATA:  Left arm weakness starting last night  EXAM: CT HEAD WITHOUT CONTRAST  TECHNIQUE: Contiguous axial images were obtained from the base of the skull through the vertex without intravenous contrast.  COMPARISON:  04/10/2013  FINDINGS: No skull fracture is noted. Paranasal sinuses shows mucosal thickening left maxillary sinus. Stable postsurgical changes left orbital floor and medial orbit.  Mild atherosclerotic calcifications of carotid siphon.  No intracranial hemorrhage, mass effect or midline shift. Stable cerebral atrophy. Stable mild subcortical chronic white matter disease. No definite evidence of acute cortical infarction. No mass lesion is noted on this unenhanced scan. Ventricular size is stable from prior exam.  IMPRESSION: No acute intracranial abnormality. Stable chronic changes as described above.   Electronically Signed   By: Lahoma Crocker M.D.   On: 08/01/2014 16:22   Mr Brain Wo Contrast  08/01/2014   CLINICAL DATA:  Acute onset of left arm weakness and numbness beginning at 18 30 last evening. The patient was diaphoretic and hypotensive at the time.  EXAM: MRI HEAD WITHOUT CONTRAST  TECHNIQUE: Multiplanar, multiecho pulse  sequences of the brain and surrounding structures were obtained without intravenous contrast.  COMPARISON:  CT head without contrast from the same day. MRI brain 04/11/2013.  FINDINGS: At least 5 different punctate foci of restricted diffusion are present within the right frontal lobe along a watershed distribution between the [facility] and MCA territories.  Moderate generalized atrophy is present. Moderate periventricular and scattered subcortical T2 hyperintensities are present bilaterally. No acute hemorrhage is present. A 7 mm dural-based  lesion over the left frontal convexity likely represents a small meningioma.  Flow is present in the major intracranial arteries. A right lens replacement is noted. The globes and orbits are otherwise intact. Chronic left ethmoid and maxillary sinus opacification is noted. The paranasal sinuses are otherwise clear. The mastoid air cells are clear.  Skullbase is unremarkable. Midline structures demonstrate fusion across the C2-3 disc space. Intracranial structures are within normal limits.  IMPRESSION: 1. At least 5 punctate nonhemorrhagic infarcts over the right frontal lobe within a watershed distribution between the [place] and MCA territories. This corresponds with the hypotensive episode and left arm symptoms. 2. Moderate atrophy and diffuse white matter disease is advanced for age. This likely reflects the sequela of chronic microvascular ischemia. 3. 7 mm dural-based lesion over the left frontal convexity likely represents a meningioma. 4. Chronic left ethmoid and maxillary sinus disease. These results were called by telephone at the time of interpretation on 08/01/2014 at 9:09 pm to Dr. Roderic Palau , who verbally acknowledged these results.   Electronically Signed   By: San Morelle M.D.   On: 08/01/2014 21:09    Assessment: 64 y.o. male with multiple risk factors for stroke, presenting with right MCA/ACA watershed distribution acute ischemic infarctions, most likely associated with patient's hypotension.  Stroke Risk Factors - hyperlipidemia, hypertension and smoking  Plan: 1. HgbA1c, fasting lipid panel 2. MRA  of the brain without contrast 3. PT consult, OT consult 4. Echocardiogram 5. Carotid dopplers 6. Prophylactic therapy-Antiplatelet med: Aspirin  7. Risk factor modification 8. Telemetry monitoring  C.R. Nicole Kindred, MD [facility] [phone]  08/01/2014, 11:51 PM

## 2014-08-01 NOTE — ED Notes (Signed)
Pt denies current numbness

## 2014-08-01 NOTE — ED Notes (Signed)
Moved out of hall into E45.

## 2014-08-02 ENCOUNTER — Inpatient Hospital Stay (HOSPITAL_COMMUNITY): Payer: Medicare Other

## 2014-08-02 DIAGNOSIS — I639 Cerebral infarction, unspecified: Secondary | ICD-10-CM

## 2014-08-02 DIAGNOSIS — I63231 Cerebral infarction due to unspecified occlusion or stenosis of right carotid arteries: Secondary | ICD-10-CM | POA: Diagnosis not present

## 2014-08-02 DIAGNOSIS — N179 Acute kidney failure, unspecified: Secondary | ICD-10-CM

## 2014-08-02 DIAGNOSIS — I5032 Chronic diastolic (congestive) heart failure: Secondary | ICD-10-CM | POA: Diagnosis not present

## 2014-08-02 DIAGNOSIS — I951 Orthostatic hypotension: Secondary | ICD-10-CM | POA: Insufficient documentation

## 2014-08-02 DIAGNOSIS — I9589 Other hypotension: Secondary | ICD-10-CM

## 2014-08-02 DIAGNOSIS — I959 Hypotension, unspecified: Secondary | ICD-10-CM | POA: Insufficient documentation

## 2014-08-02 DIAGNOSIS — I638 Other cerebral infarction: Secondary | ICD-10-CM

## 2014-08-02 LAB — ETHANOL: Alcohol, Ethyl (B): <5 mg/dL (ref <5)

## 2014-08-02 LAB — BASIC METABOLIC PANEL
Anion gap: 10 (ref 5–15)
BUN: 17 mg/dL (ref 6–20)
CHLORIDE: 107 mmol/L (ref 101–111)
CO2: 25 mmol/L (ref 22–32)
CREATININE: 0.97 mg/dL (ref 0.61–1.24)
Calcium: 9.9 mg/dL (ref 8.9–10.3)
GFR calc non Af Amer: >60 mL/min (ref >60)
Glucose, Bld: 116 mg/dL — ABNORMAL HIGH (ref 65–99)
Potassium: 4.1 mmol/L (ref 3.5–5.1)
SODIUM: 142 mmol/L (ref 135–145)

## 2014-08-02 LAB — LIPID PANEL
CHOL/HDL RATIO: 4.3 ratio
CHOLESTEROL: 130 mg/dL (ref 0–200)
HDL: 30 mg/dL — ABNORMAL LOW (ref >40)
LDL Cholesterol: 75 mg/dL (ref 0–99)
Triglycerides: 125 mg/dL (ref <150)
VLDL: 25 mg/dL (ref 0–40)

## 2014-08-02 LAB — RAPID URINE DRUG SCREEN, HOSP PERFORMED
Amphetamines: NOT DETECTED
BENZODIAZEPINES: POSITIVE — AB
Barbiturates: NOT DETECTED
COCAINE: NOT DETECTED
OPIATES: NOT DETECTED
Tetrahydrocannabinol: NOT DETECTED

## 2014-08-02 MED ORDER — RIVAROXABAN 20 MG PO TABS
20.0000 mg | ORAL_TABLET | Freq: Every day | ORAL | Status: DC
  Administered 2014-08-03: 20 mg via ORAL
  Filled 2014-08-02: qty 1

## 2014-08-02 MED ORDER — GABAPENTIN 100 MG PO CAPS
100.0000 mg | ORAL_CAPSULE | Freq: Three times a day (TID) | ORAL | Status: DC
  Administered 2014-08-02 – 2014-08-03 (×4): 100 mg via ORAL
  Filled 2014-08-02 (×4): qty 1

## 2014-08-02 MED ORDER — ENOXAPARIN SODIUM 40 MG/0.4ML ~~LOC~~ SOLN
SUBCUTANEOUS | Status: AC
  Filled 2014-08-02: qty 0.4

## 2014-08-02 MED ORDER — TRAZODONE HCL 50 MG PO TABS
25.0000 mg | ORAL_TABLET | Freq: Once | ORAL | Status: AC
  Administered 2014-08-02: 25 mg via ORAL
  Filled 2014-08-02: qty 1

## 2014-08-02 MED ORDER — RIVAROXABAN 20 MG PO TABS: 20.0000 mg | ORAL_TABLET | Freq: Every day | ORAL | Status: DC

## 2014-08-02 MED ORDER — ENOXAPARIN SODIUM 40 MG/0.4ML ~~LOC~~ SOLN
40.0000 mg | SUBCUTANEOUS | Status: DC
  Administered 2014-08-02: 40 mg via SUBCUTANEOUS

## 2014-08-02 MED ORDER — ASPIRIN 325 MG PO TABS
325.0000 mg | ORAL_TABLET | Freq: Every day | ORAL | Status: DC
  Administered 2014-08-02 – 2014-08-03 (×2): 325 mg via ORAL
  Filled 2014-08-02 (×2): qty 1

## 2014-08-02 NOTE — Progress Notes (Signed)
Nutrition Brief Note  Patient identified on the Malnutrition Screening Tool (MST) Report  Wt Readings from Last 15 Encounters:  08/01/14 255 lb 8 oz (115.894 kg)  02/23/14 248 lb (112.492 kg)  02/21/14 244 lb 11.2 oz (110.995 kg)  11/23/13 252 lb 1.6 oz (114.352 kg)  11/21/13 261 lb (118.389 kg)  08/03/13 250 lb (113.399 kg)  07/26/13 247 lb (112.038 kg)  06/05/13 240 lb (108.863 kg)  04/12/13 240 lb (108.863 kg)  08/10/12 248 lb (112.492 kg)  07/27/12 241 lb (109.317 kg)  04/05/12 245 lb 8 oz (111.358 kg)  03/14/12 240 lb (108.863 kg)  09/03/10 261 lb (118.389 kg)    Body mass index is 34.64 kg/(m^2). Patient meets criteria for Obesity based on current BMI. Pt reports usual weight of 270 lbs and losing 15-20 lbs in the past few weeks. States he has a great appetite and eats plenty so, he doesn't know what causes the weight loss. Pt appears very well-nourished. No evidence of weight loss per weights above. RD explained heart healthy diet. Pt denies any questions or concerns.   Current diet order is Heart Healthy, patient is consuming approximately 100% of meals at this time. Labs and medications reviewed.   No nutrition interventions warranted at this time. If nutrition issues arise, please consult RD.   Pryor Ochoa RD, LDN Inpatient Clinical Dietitian Pager: [phone] After Hours Pager: [phone]

## 2014-08-02 NOTE — Progress Notes (Signed)
VASCULAR LAB PRELIMINARY  PRELIMINARY  PRELIMINARY  PRELIMINARY  Carotid duplex  completed.    Preliminary report:  Bilateral:  1-39% ICA stenosis.  Vertebral artery flow is antegrade.  Left:  Proximal portion of the ECA appears occluded.    Emrah Ariola, RVT 08/02/2014, 11:40 AM

## 2014-08-02 NOTE — Care Management Note (Signed)
Case Management Note  Patient Details  Name: [name] MRN: [id number] Date of Birth: 1951/01/03  Subjective/Objective:                    Action/Plan: Met with patient to discuss discharge planning. Consult was received for medication needs.  CM spoke with patient, who denies any difficulty obtaining medications.  He has both [facility] Medicare and Medicaid as secondary.  He is currently requesting assistance finding a new PCP.  CM discussed the [facility] with the patient, who states that he is interested.  CM spoke with Carmela Hurt with the transitional care clinic within the [facility].  She will speak with patient to offer their services in addition to making an appointment for him at the [facility] at discharge.  Expected Discharge Date:                  Expected Discharge Plan:  [place] (Patient lives at home alone. Has [place] aide through [employer] duty agency)  In-House Referral:     Discharge planning Services     Post Acute Care Choice:    Choice offered to:     DME Arranged:    DME Agency:     HH Arranged:    [place] Agency:     Status of Service:  In process, will continue to follow  Medicare Important Message Given:    Date Medicare IM Given:    Medicare IM give by:    Date Additional Medicare IM Given:    Additional Medicare Important Message give by:     If discussed at [place] of Stay Meetings, dates discussed:    Additional Comments:  Rolm Baptise, RN 08/02/2014, 2:34 PM

## 2014-08-02 NOTE — Progress Notes (Signed)
Pt. Arrived from ED with tech with VSS and reporting no pain. Will continue to monitor. Joaquin Bend E, RN 08/02/2014 12:12 AM

## 2014-08-02 NOTE — Progress Notes (Signed)
CCMD called to inform that patient is in second degree heart block type one. Patient is asymptomatic and I paged Family medicine to make aware. Will continue to monitor his condition. Ruben Gottron, [place] 08/02/2014 6:36 AM

## 2014-08-02 NOTE — Evaluation (Signed)
Physical Therapy Evaluation Patient Details Name: [name] MRN: [id number] DOB: Aug 05, 1950 Today's Date: 08/02/2014   History of Present Illness  Patient is a 64 y/o male admitted for LUE weakness/numbness and slurred speech which has since resolved after arriving to ED. MRI- + Rt frontal lobe infarct, watershed btw ACA/MCA. PMH includes alcoholism, PVD, COPD, CAD, MI, HTN.   Clinical Impression  Patient reports all symptoms have resolved. No weakness or numbness reported/noted. At baseline, pt uses [facility] for ambulation and has assist with all IADLs from PCA. Pt is functioning close to baseline and able to ambulate community distances without LOB or difficulty. Encouraged ambulation daily while in hospital with tech to maintain strength. Pt does not require further skilled therapy services. Discharge from therapy.     Follow Up Recommendations No PT follow up;Supervision - Intermittent    Equipment Recommendations  None recommended by PT    Recommendations for Other Services       Precautions / Restrictions Precautions Precautions: None Restrictions Weight Bearing Restrictions: No      Mobility  Bed Mobility Overal bed mobility: Modified Independent                Transfers Overall transfer level: Modified independent Equipment used: None                Ambulation/Gait Ambulation/Gait assistance: Modified independent (Device/Increase time) Ambulation Distance (Feet): 350 Feet Assistive device: None Gait Pattern/deviations: Step-through pattern;Decreased stride length   Gait velocity interpretation: Below normal speed for age/gender General Gait Details: Slow, steady gait with 1 instance of unsteadiness however no LOB. Uses cane at baseline. does not leave his apt much.  Stairs            Wheelchair Mobility    Modified Rankin (Stroke Patients Only) Modified Rankin (Stroke Patients Only) Pre-Morbid Rankin Score: Moderate disability Modified  Rankin: Moderate disability     Balance Overall balance assessment: Needs assistance Sitting-balance support: Feet supported;No upper extremity supported Sitting balance-Leahy Scale: Normal Sitting balance - Comments: Able to reach outside BoS to donn socks, no LOB.   Standing balance support: During functional activity Standing balance-Leahy Scale: Fair                               Pertinent Vitals/Pain Pain Assessment: No/denies pain    Home Living Family/patient expects to be discharged to:: Private residence Living Arrangements: Alone Available Help at Discharge: Personal care attendant Type of Home: Apartment Home Access: Elevator     Home Layout: One level Home Equipment: Cane - single point      Prior Function Level of Independence: Needs assistance   Gait / Transfers Assistance Needed: Uses SPC at all times when walking.   ADL's / Homemaking Assistance Needed: Aide assists with chores, cooking, getting his food.         Hand Dominance   Dominant Hand: Right    Extremity/Trunk Assessment   Upper Extremity Assessment: Defer to OT evaluation           Lower Extremity Assessment: Overall WFL for tasks assessed         Communication   Communication: HOH  Cognition Arousal/Alertness: Awake/alert Behavior During Therapy: WFL for tasks assessed/performed Overall Cognitive Status: Within Functional Limits for tasks assessed                      General Comments      Exercises  Assessment/Plan    PT Assessment Patent does not need any further PT services  PT Diagnosis     PT Problem List    PT Treatment Interventions     PT Goals (Current goals can be found in the Care Plan section) Acute Rehab PT Goals PT Goal Formulation: All assessment and education complete, DC therapy    Frequency     Barriers to discharge        Co-evaluation               End of Session   Activity Tolerance: Patient  tolerated treatment well Patient left: in bed;with call bell/phone within reach;with bed alarm set Nurse Communication: Mobility status         Time: 1021-1173 PT Time Calculation (min) (ACUTE ONLY): 14 min   Charges:   PT Evaluation $Initial PT Evaluation Tier I: 1 Procedure     PT G Codes:        Azaylah Stailey A Kamauri Denardo 08/02/2014, 2:30 PM Wray Kearns, [place], DPT [phone]

## 2014-08-02 NOTE — Progress Notes (Signed)
Family Medicine Teaching Service Daily Progress Note Intern Pager: [phone]  Patient name: [name] Medical record number: [id number] Date of birth: 1950-07-27 Age: 64 y.o. Gender: male  Primary Care Provider: [name], MD Consultants: Neurology  Code Status: DNR  Pt Overview and Major Events to Date:  6/22: Admitted with L sided weakness/numbness concerning for stroke. R frontal lobe CVA   Assessment and Plan: [name] is a 64 y.o. male presenting with recurrent transient right upper extremity weakness found to have a right frontal lobe CVA. PMH is significant for CAD s/p cath, bilateral PAD, HLD, HFpEF, tobacco dependence, HTN, COPD, DVT, PE, s/p fem-pop  # CVA, right frontal lobe: per radiology report, located in a watershed area. Most likely reason for patient's symptoms which are most likely precipitated by decreased blood pressure to the area. Pt's ASCVD 55yrrisk is 25.7%.  - continue to monitor on telemetry - appreciate neurology recommendations - hold lisinopril - frequent neuro checks  - A1C pending  - echocardiogram ordered  - carotid dopplers ordered  - ASA '325mg'$   - atorvastatin '40mg'$  daily given elevated ASCVD risk - neuro checks - PT/OT eval  # AKI: SCr 1.8 on admission, most likely secondary to prerenal cause. Baseline appears to be below 1. S/p NS boluses in the ED. - back to baseline. - continue to monitor BMP.  # COPD: Patient has not been taking Dulera or Spiriva. He has been taking albuterol. Exam only significant for mild wheezing. Currently on room air and comfortable. Chest x-ray clear. - start guaifenesin '400mg'$  q4hrs PRN - continue home Dulera - continue home Spiriva - continue home Albuterol  # HFpEF: EF of 60-65% in 2014. Does not appear overloaded. BNP of 42.4 on admission - hold home lasix - repeat echo per stroke work up protocol   # Hypertension: blood pressure controlled - hold lisinopril as above - will allow  permissive HTN for 24 to 48hrs, especially given concerns of infarcts in the watershed region secondary to hypotension.   # Hx of DVT and PE: was on lifelong Xarelto. Has not taken since about March or April secondary to rectal bleeding. - pt okay with re-starting xarelto  # Tobacco dependence: currently smokes 1.5 cigars daily - nicotine patch '14mg'$  - CSW consult  # Hx of polysubstance abuse (alcohol/cocaine most recently): last drink stated to be around June 4th. States he used to drink a pint of vodka twice a month before then. States he has not used any other drugs since he was young in his teens.  - UDS positive for benzodiazepines - CIWA - thiamine - multivitamin - folate - CSW consult   #Psych: Pt had nortriptyline on his med list, however stated he's not taking this. - will continue to monitor.   # OSA: history of CPAP usage at home, although currently not using because of improper fit - CPAP qhs  FEN/GI: IV saline lock, Heart healthy diet PPx: restarted home xarelto   Disposition: Pending stroke work up   Subjective:  Patient back to baseline per him. Continues to endorse burning/throbbing pain in his feet bilaterally.   Objective: Temp:  [97.5 F (36.4 C)-98.1 F (36.7 C)] 97.5 F (36.4 C) (06/23 0500) Pulse Rate:  [76-98] 80 (06/23 0500) Resp:  [14-25] 22 (06/23 0500) BP: (86-150)/(45-70) 150/60 mmHg (06/23 0500) SpO2:  [93 %-100 %] 93 % (06/23 0500) Weight:  [255 lb 8 oz (115.894 kg)] 255 lb 8 oz (115.894 kg) (06/22 2237) Physical Exam: General:  Lying back in bed in NAD Cardiovascular: RRR, no m/r/g noted. Trace pitting edema b/l. No JVD Respiratory: Non-labored. CTAB without wheezing, rhonchi, or crackles noted.  Abdomen: +BS, soft, non-distended, non-tender. Neuro: A&O x4. Speech clear. No word finding difficulty. PERRL. EOMI, Facial movements symmetric, tongue and uvula midline. Sensation intact in V1-V3 over the face. Shoulder shrug intact. 5/5 strength in  the UE and LE bilaterally. Normal bulk and tone w/o fasciculations. Sensation intact to light touch on LE and UE b/l. No dysmetria with FNF.   Laboratory:  Recent Labs Lab 08/01/14 1550 08/01/14 1606  WBC 8.0  --   HGB 16.4 18.0*  HCT 47.7 53.0*  PLT 152  --     Recent Labs Lab 08/01/14 1550 08/01/14 1606 08/02/14 0445  NA 138 138 142  K 4.2 4.1 4.1  CL 103 101 107  CO2 25  --  25  BUN 23* 27* 17  CREATININE 1.76* 1.80* 0.97  CALCIUM 10.1  --  9.9  PROT 7.1  --   --   BILITOT 0.8  --   --   ALKPHOS 67  --   --   ALT 34  --   --   AST 28  --   --   GLUCOSE 102* 99 116*   . Risk Stratification Labs  TSH    Component Value Date/Time   TSH 1.399 04/10/2013 0427   Hemoglobin A1C    Component Value Date/Time   HGBA1C 6.0* 04/10/2013 0427   Lipid Panel     Component Value Date/Time   CHOL 130 08/02/2014 0445   TRIG 125 08/02/2014 0445   HDL 30* 08/02/2014 0445   CHOLHDL 4.3 08/02/2014 0445   VLDL 25 08/02/2014 0445   LDLCALC 75 08/02/2014 0445      Imaging/Diagnostic Tests: Dg Chest 2 View  08/01/2014   CLINICAL DATA:  Left arm weakness and numbness. Productive cough for 3 weeks.  EXAM: CHEST  2 VIEW  COMPARISON:  06/23/2014 and 06/18/2014  FINDINGS: Heart size and pulmonary vascularity are normal. The lungs are clear. There is a hiatal hernia. The CT scan of 12/12/2012 demonstrated that the Nissen fundoplication is herniated above the diaphragm. The wrap was intact at that time.  No acute osseous abnormality.  IMPRESSION: No acute abnormality.   Electronically Signed   By: Lorriane Shire M.D.   On: 08/01/2014 16:36   Ct Head (brain) Wo Contrast  08/01/2014   CLINICAL DATA:  Left arm weakness starting last night  EXAM: CT HEAD WITHOUT CONTRAST  TECHNIQUE: Contiguous axial images were obtained from the base of the skull through the vertex without intravenous contrast.  COMPARISON:  04/10/2013  FINDINGS: No skull fracture is noted. Paranasal sinuses shows  mucosal thickening left maxillary sinus. Stable postsurgical changes left orbital floor and medial orbit.  Mild atherosclerotic calcifications of carotid siphon.  No intracranial hemorrhage, mass effect or midline shift. Stable cerebral atrophy. Stable mild subcortical chronic white matter disease. No definite evidence of acute cortical infarction. No mass lesion is noted on this unenhanced scan. Ventricular size is stable from prior exam.  IMPRESSION: No acute intracranial abnormality. Stable chronic changes as described above.   Electronically Signed   By: Lahoma Crocker M.D.   On: 08/01/2014 16:22   Mr Brain Wo Contrast  08/01/2014   CLINICAL DATA:  Acute onset of left arm weakness and numbness beginning at 18 30 last evening. The patient was diaphoretic and hypotensive at the time.  EXAM: MRI  HEAD WITHOUT CONTRAST  TECHNIQUE: Multiplanar, multiecho pulse sequences of the brain and surrounding structures were obtained without intravenous contrast.  COMPARISON:  CT head without contrast from the same day. MRI brain 04/11/2013.  FINDINGS: At least 5 different punctate foci of restricted diffusion are present within the right frontal lobe along a watershed distribution between the [facility] and MCA territories.  Moderate generalized atrophy is present. Moderate periventricular and scattered subcortical T2 hyperintensities are present bilaterally. No acute hemorrhage is present. A 7 mm dural-based lesion over the left frontal convexity likely represents a small meningioma.  Flow is present in the major intracranial arteries. A right lens replacement is noted. The globes and orbits are otherwise intact. Chronic left ethmoid and maxillary sinus opacification is noted. The paranasal sinuses are otherwise clear. The mastoid air cells are clear.  Skullbase is unremarkable. Midline structures demonstrate fusion across the C2-3 disc space. Intracranial structures are within normal limits.  IMPRESSION: 1. At least 5 punctate  nonhemorrhagic infarcts over the right frontal lobe within a watershed distribution between the [place] and MCA territories. This corresponds with the hypotensive episode and left arm symptoms. 2. Moderate atrophy and diffuse white matter disease is advanced for age. This likely reflects the sequela of chronic microvascular ischemia. 3. 7 mm dural-based lesion over the left frontal convexity likely represents a meningioma. 4. Chronic left ethmoid and maxillary sinus disease. These results were called by telephone at the time of interpretation on 08/01/2014 at 9:09 pm to Dr. Roderic Palau , who verbally acknowledged these results.   Electronically Signed   By: San Morelle M.D.   On: 08/01/2014 21:09     Archie Patten, MD 08/02/2014, 6:49 AM PGY-1, [place] Intern pager: [phone], text pages welcome

## 2014-08-02 NOTE — Clinical Social Work Note (Addendum)
CSW Consult Acknowledged:   CSW received a consult for substance abuse. CSW met pt at bedside. Pt reported that he did not want to talk about substance abuse. CSW attempted to explained his positive toxic report. Pt reported that the toxic report is wrong and deined any use of drugs. Pt become upset then asked the CSW to leave his room. CSW can not provided substance abuse resources. CSW also received consult for medication assistance. CSW will inform the case manager about this consult. At this time no additional needs are identified. CSW will sign off.        [place], MSW, [place]

## 2014-08-02 NOTE — Progress Notes (Signed)
STROKE TEAM PROGRESS NOTE   HISTORY [name] is a 64 y.o. male with a history of hypertension, hyperlipidemia, myocardial infarction, COPD, congestive heart failure, alcohol abuse and tobacco abuse, as well as DVT and poor compliance with taking anti-coagulation medication, presenting with weakness involving the left upper extremity and slurred speech. Symptoms began about 6:30 evening of 07/31/2014. Symptoms worsened this afternoon around 1 PM. ET scan of his head was unremarkable. MRI showed multiple right frontal ischemic infarctions with a pattern consistent with watershed, MCA/ACA strokes. She also complained of being lightheaded and was hypotensive systolic blood pressure of 80 when he presented to the emergency room. He improved following mission. NIH stroke score at the time of this evaluation was 0.  LSN: 6:30 PM on 07/31/2014 tPA Given: No: Beyond time window for treatment consideration mRankin:    SUBJECTIVE (INTERVAL HISTORY) No family members present. The patient states he's feeling better today. Dr. Leonie Man recommended alcohol and tobacco cessation. The patient denies illegal drug use.   OBJECTIVE Temp:  [97.5 F (36.4 C)-98.6 F (37 C)] 98.6 F (37 C) (06/23 0800) Pulse Rate:  [76-98] 94 (06/23 0800) Cardiac Rhythm:  [-] Normal sinus rhythm (06/23 0800) Resp:  [14-25] 16 (06/23 0800) BP: (86-153)/(45-80) 153/78 mmHg (06/23 0800) SpO2:  [93 %-100 %] 96 % (06/23 0853) Weight:  [115.894 kg (255 lb 8 oz)] 115.894 kg (255 lb 8 oz) (06/22 2237)   Recent Labs Lab 08/01/14 1559  GLUCAP 95    Recent Labs Lab 08/01/14 1550 08/01/14 1606 08/02/14 0445  NA 138 138 142  K 4.2 4.1 4.1  CL 103 101 107  CO2 25  --  25  GLUCOSE 102* 99 116*  BUN 23* 27* 17  CREATININE 1.76* 1.80* 0.97  CALCIUM 10.1  --  9.9    Recent Labs Lab 08/01/14 1550  AST 28  ALT 34  ALKPHOS 67  BILITOT 0.8  PROT 7.1  ALBUMIN 3.9    Recent Labs Lab 08/01/14 1550  08/01/14 1606  WBC 8.0  --   NEUTROABS 4.7  --   HGB 16.4 18.0*  HCT 47.7 53.0*  MCV 90.2  --   PLT 152  --    No results for input(s): CKTOTAL, CKMB, CKMBINDEX, TROPONINI in the last 168 hours.  Recent Labs  08/01/14 1550  LABPROT 13.4  INR 1.00   No results for input(s): COLORURINE, LABSPEC, PHURINE, GLUCOSEU, HGBUR, BILIRUBINUR, KETONESUR, PROTEINUR, UROBILINOGEN, NITRITE, LEUKOCYTESUR in the last 72 hours.  Invalid input(s): APPERANCEUR     Component Value Date/Time   CHOL 130 08/02/2014 0445   TRIG 125 08/02/2014 0445   HDL 30* 08/02/2014 0445   CHOLHDL 4.3 08/02/2014 0445   VLDL 25 08/02/2014 0445   LDLCALC 75 08/02/2014 0445   Lab Results  Component Value Date   HGBA1C 6.0* 04/10/2013      Component Value Date/Time   LABOPIA NONE DETECTED 08/02/2014 0501   COCAINSCRNUR NONE DETECTED 08/02/2014 0501   LABBENZ POSITIVE* 08/02/2014 0501   AMPHETMU NONE DETECTED 08/02/2014 0501   THCU NONE DETECTED 08/02/2014 0501   LABBARB NONE DETECTED 08/02/2014 0501     Recent Labs Lab 08/02/14 0019  ETH <5   Imaging   Dg Chest 2 View 08/01/2014    No acute abnormality.    Ct Head (brain) Wo Contrast 08/01/2014    No acute intracranial abnormality. Stable chronic changes as described above.       Mr Brain Wo Contrast 08/01/2014  1. At least 5 punctate nonhemorrhagic infarcts over the right frontal lobe within a watershed distribution between the [place] and MCA territories. This corresponds with the hypotensive episode and left arm symptoms.  2. Moderate atrophy and diffuse white matter disease is advanced for age. This likely reflects the sequela of chronic microvascular ischemia. 3. 7 mm dural-based lesion over the left frontal convexity likely represents a meningioma.  4. Chronic left ethmoid and maxillary sinus disease.   MRA Head 08/02/2014 1. High-grade right paraclinoid ICA and moderate right A1 segment stenoses. These correlate with suspected watershed  infarcts on yesterday's brain MRI. 2. Mild luminal irregularity and narrowing of the upper right cervical ICA. This is likely atherosclerotic (calcified plaque present on head CT), but consider neck angiography to exclude a dissection or proximal stenosis with underfilling.     PHYSICAL EXAM Middle aged male not in distress. . Afebrile. Head is nontraumatic. Neck is supple without bruit.    Cardiac exam no murmur or gallop. Lungs are clear to auscultation. Distal pulses are well felt.  Neurological Exam : Awake alert oriented x 3 normal speech and language. Mild left lower face asymmetry. Tongue midline. No drift. Mild diminished fine finger movements on left. Orbits right over left upper extremity. Mild left grip weak.. Normal sensation . Normal coordination.    ASSESSMENT/PLAN [name] is a 64 y.o. male with history of hypertension, hyperlipidemia, coronary artery disease with previous MI, COPD, congestive heart failure, alcohol abuse, tobacco abuse, history of DVT, poor medical compliance with anticoagulation, and hypotension on admission was systolic blood pressure of 80 presenting with left upper extremity weakness and dysarthria.  He did not receive IV t-PA due to late presentation.  Stroke:  Non-dominant watershed versus embolic infarcts from an unknown source.  Resultant significant improvement in deficits.  MRI  At least 5 punctate nonhemorrhagic infarcts over the right frontal lobe within a watershed distribution between the   [place] and MCA territories. This corresponds with the hypotensive episode and left arm symptoms.   MRA  as above - consider neck angiography - normal renal function.  Carotid Doppler - Bilateral:1-39% ICA stenosis. Vertebral artery flow is antegrade. Left:Proximal portion of the ECA appears  occluded.  2D Echo pending  LDL 75  HgbA1c pending  Xarelto for VTE prophylaxis Diet Heart Room service appropriate?: Yes; Fluid consistency::  Thin  xarelto ( rivaroxaban) prior to admission, now on xarelto ( rivaroxaban)  Patient counseled to be compliant with his antithrombotic medications  Ongoing aggressive stroke risk factor management  Therapy recommendations: No follow-up physical therapy recommended  Disposition:  Pending  Hypertension  Home meds: Lisinopril  Stable off lisinopril   Hyperlipidemia  Home meds: No lipid lowering medications prior to admission  LDL 75 goal < 70  Consider low-dose statin  Continue statin at discharge    Other Stroke Risk Factors  Advanced age  Cigarette smoker, quit smoking advised to stop smoking.  ETOH use  Obesity, Body mass index is 34.64 kg/(m^2).   Coronary artery disease  Obstructive sleep apnea, on CPAP at home  Previous stroke   Other Active Problems  Renal insufficiency - improved off ACE inhibitor   Other Pertinent History  Previous stroke  PLAN  Discussed MRA findings with Dr. Leonie Man. Patient is on Xarelto and carotid Dopplers showed only left ECA occlusion. No need for neck angiography.     Hospital day # 1  Mikey Bussing [facility] Pager [phone] 08/02/2014, 10:10 AM  I have personally examined this patient, reviewed notes, independently viewed imaging studies, participated in medical decision making and plan of care. I have made any additions or clarifications directly to the above note. Agree with note above. He presented with slurred speech and left facial weakness and MRI shows tiny scattered right frontal infarcts etiology to be determined. He remains at risk for neurological worsening, recurrent strokes and TIAs and needs ongoing stroke evaluation and aggressive risk factor modification. Avoid hypotension.Continue xarelto Antony Contras, MD Medical Director [place] Pager: [phone] 08/02/2014 6:44 PM    To contact Stroke Continuity provider, please refer to [email or web address] After hours,  contact General Neurology

## 2014-08-02 NOTE — Hospital Discharge Follow-Up (Signed)
This Case Manager collaborated with Lorne Skeens, RN CM regarding patient.  She indicates patient has informed her he is looking for a new PCP; he no longer wants to use [facility].  Met with patient at bedside to discuss [facility]. Patient declines [facility] services and indicates he is looking for a new PCP.  Patient informed of the [facility] and [facility]; patient verbalized understanding and indicates he would like to establish care with PCP at clinic.  Appointment obtained on 08/20/2014 at 1530 with Dr. Adrian Blackwater. Patient appreciative of appointment.  Lorne Skeens, RN CM updated.

## 2014-08-03 ENCOUNTER — Inpatient Hospital Stay (HOSPITAL_COMMUNITY): Payer: Medicare Other

## 2014-08-03 DIAGNOSIS — I5033 Acute on chronic diastolic (congestive) heart failure: Secondary | ICD-10-CM

## 2014-08-03 DIAGNOSIS — I63411 Cerebral infarction due to embolism of right middle cerebral artery: Secondary | ICD-10-CM | POA: Insufficient documentation

## 2014-08-03 DIAGNOSIS — I10 Essential (primary) hypertension: Secondary | ICD-10-CM

## 2014-08-03 LAB — CBC
HCT: 44.5 % (ref 39.0–52.0)
Hemoglobin: 14.8 g/dL (ref 13.0–17.0)
MCH: 30.1 pg (ref 26.0–34.0)
MCHC: 33.3 g/dL (ref 30.0–36.0)
MCV: 90.6 fL (ref 78.0–100.0)
Platelets: 127 10*3/uL — ABNORMAL LOW (ref 150–400)
RBC: 4.91 MIL/uL (ref 4.22–5.81)
RDW: 13.4 % (ref 11.5–15.5)
WBC: 6.7 10*3/uL (ref 4.0–10.5)

## 2014-08-03 LAB — BASIC METABOLIC PANEL
ANION GAP: 10 (ref 5–15)
BUN: 12 mg/dL (ref 6–20)
CHLORIDE: 107 mmol/L (ref 101–111)
CO2: 27 mmol/L (ref 22–32)
Calcium: 10.1 mg/dL (ref 8.9–10.3)
Creatinine, Ser: 0.69 mg/dL (ref 0.61–1.24)
GFR calc Af Amer: >60 mL/min (ref >60)
GFR calc non Af Amer: >60 mL/min (ref >60)
GLUCOSE: 113 mg/dL — AB (ref 65–99)
POTASSIUM: 4.2 mmol/L (ref 3.5–5.1)
SODIUM: 144 mmol/L (ref 135–145)

## 2014-08-03 LAB — HEMOGLOBIN A1C
Hgb A1c MFr Bld: 6.4 % — ABNORMAL HIGH (ref 4.8–5.6)
Mean Plasma Glucose: 137 mg/dL

## 2014-08-03 MED ORDER — FUROSEMIDE 40 MG PO TABS: 40.0000 mg | ORAL_TABLET | Freq: Every day | ORAL | Status: DC | PRN

## 2014-08-03 MED ORDER — GABAPENTIN 100 MG PO CAPS
100.0000 mg | ORAL_CAPSULE | Freq: Three times a day (TID) | ORAL | Status: DC
Start: 2014-08-03 — End: 2014-10-17

## 2014-08-03 MED ORDER — GADOBENATE DIMEGLUMINE 529 MG/ML IV SOLN: 20.0000 mL | Freq: Once | INTRAVENOUS | Status: DC | PRN

## 2014-08-03 MED ORDER — ATORVASTATIN CALCIUM 40 MG PO TABS: 40.0000 mg | ORAL_TABLET | Freq: Every day | ORAL | Status: DC

## 2014-08-03 NOTE — Progress Notes (Addendum)
STROKE TEAM PROGRESS NOTE   HISTORY [name] is a 64 y.o. male with a history of hypertension, hyperlipidemia, myocardial infarction, COPD, congestive heart failure, alcohol abuse and tobacco abuse, as well as DVT and poor compliance with taking anti-coagulation medication, presenting with weakness involving the left upper extremity and slurred speech. Symptoms began about 6:30 evening of 07/31/2014. Symptoms worsened this afternoon around 1 PM. ET scan of his head was unremarkable. MRI showed multiple right frontal ischemic infarctions with a pattern consistent with watershed, MCA/ACA strokes. She also complained of being lightheaded and was hypotensive systolic blood pressure of 80 when he presented to the emergency room. He improved following mission. NIH stroke score at the time of this evaluation was 0.  LSN: 6:30 PM on 07/31/2014 tPA Given: No: Beyond time window for treatment consideration mRankin:    SUBJECTIVE (INTERVAL HISTORY) No family members present. The patient continues to feel better. Dr. Leonie Man explained to the patient that he did  have a stroke  likely the result of hypotension. With intracranial stenosis   OBJECTIVE Temp:  [97.8 F (36.6 C)-98.6 F (37 C)] 98.3 F (36.8 C) (06/24 1436) Pulse Rate:  [84-92] 84 (06/24 1436) Cardiac Rhythm:  [-] Normal sinus rhythm (06/24 0800) Resp:  [18-19] 18 (06/24 1436) BP: (117-160)/(49-77) 136/70 mmHg (06/24 1436) SpO2:  [93 %-97 %] 93 % (06/24 1436)   Recent Labs Lab 08/01/14 1559  GLUCAP 95    Recent Labs Lab 08/01/14 1550 08/01/14 1606 08/02/14 0445 08/03/14 0417  NA 138 138 142 144  K 4.2 4.1 4.1 4.2  CL 103 101 107 107  CO2 25  --  25 27  GLUCOSE 102* 99 116* 113*  BUN 23* 27* 17 12  CREATININE 1.76* 1.80* 0.97 0.69  CALCIUM 10.1  --  9.9 10.1    Recent Labs Lab 08/01/14 1550  AST 28  ALT 34  ALKPHOS 67  BILITOT 0.8  PROT 7.1  ALBUMIN 3.9    Recent Labs Lab 08/01/14 1550  08/01/14 1606 08/03/14 0417  WBC 8.0  --  6.7  NEUTROABS 4.7  --   --   HGB 16.4 18.0* 14.8  HCT 47.7 53.0* 44.5  MCV 90.2  --  90.6  PLT 152  --  127*   No results for input(s): CKTOTAL, CKMB, CKMBINDEX, TROPONINI in the last 168 hours.  Recent Labs  08/01/14 1550  LABPROT 13.4  INR 1.00   No results for input(s): COLORURINE, LABSPEC, PHURINE, GLUCOSEU, HGBUR, BILIRUBINUR, KETONESUR, PROTEINUR, UROBILINOGEN, NITRITE, LEUKOCYTESUR in the last 72 hours.  Invalid input(s): APPERANCEUR     Component Value Date/Time   CHOL 130 08/02/2014 0445   TRIG 125 08/02/2014 0445   HDL 30* 08/02/2014 0445   CHOLHDL 4.3 08/02/2014 0445   VLDL 25 08/02/2014 0445   LDLCALC 75 08/02/2014 0445   Lab Results  Component Value Date   HGBA1C 6.4* 08/02/2014      Component Value Date/Time   LABOPIA NONE DETECTED 08/02/2014 0501   COCAINSCRNUR NONE DETECTED 08/02/2014 0501   LABBENZ POSITIVE* 08/02/2014 0501   AMPHETMU NONE DETECTED 08/02/2014 0501   THCU NONE DETECTED 08/02/2014 0501   LABBARB NONE DETECTED 08/02/2014 0501     Recent Labs Lab 08/02/14 0019  ETH <5   Imaging   Dg Chest 2 View 08/01/2014    No acute abnormality.    Ct Head (brain) Wo Contrast 08/01/2014    No acute intracranial abnormality. Stable chronic changes as described above.  Mr Brain Wo Contrast 08/01/2014    1. At least 5 punctate nonhemorrhagic infarcts over the right frontal lobe within a watershed distribution between the [place] and MCA territories. This corresponds with the hypotensive episode and left arm symptoms.  2. Moderate atrophy and diffuse white matter disease is advanced for age. This likely reflects the sequela of chronic microvascular ischemia. 3. 7 mm dural-based lesion over the left frontal convexity likely represents a meningioma.  4. Chronic left ethmoid and maxillary sinus disease.   MRA Head 08/02/2014 1. High-grade right paraclinoid ICA and moderate right A1 segment stenoses.  These correlate with suspected watershed infarcts on yesterday's brain MRI. 2. Mild luminal irregularity and narrowing of the upper right cervical ICA. This is likely atherosclerotic (calcified plaque present on head CT), but consider neck angiography to exclude a dissection or proximal stenosis with underfilling.     PHYSICAL EXAM Middle aged male not in distress. . Afebrile. Head is nontraumatic. Neck is supple without bruit.    Cardiac exam no murmur or gallop. Lungs are clear to auscultation. Distal pulses are well felt.  Neurological Exam : Awake alert oriented x 3 normal speech and language. Mild left lower face asymmetry. Tongue midline. No drift. Mild diminished fine finger movements on left. Orbits right over left upper extremity. Mild left grip weak.. Normal sensation . Normal coordination.    ASSESSMENT/PLAN [name] is a 63 y.o. male with history of hypertension, hyperlipidemia, coronary artery disease with previous MI, COPD, congestive heart failure, alcohol abuse, tobacco abuse, history of DVT, poor medical compliance with anticoagulation, and hypotension on admission was systolic blood pressure of 80 presenting with left upper extremity weakness and dysarthria.  He did not receive IV t-PA due to late presentation.  Stroke:  Non-dominant watershed   infarcts likely due to hypotension with intracranial right carotid stenosis.  Resultant significant improvement in deficits.  MRI  At least 5 punctate nonhemorrhagic infarcts over the right frontal lobe within a watershed distribution between the   [place] and MCA territories. This corresponds with the hypotensive episode and left arm symptoms.   MRA  as above - consider neck angiography - normal renal function.  Carotid Doppler - Bilateral:1-39% ICA stenosis. Vertebral artery flow is antegrade. Left:Proximal portion of the ECA appears  occluded.  2D Echo EF 50-55%. No cardiac source of emboli identified.  LDL  75  HgbA1c 6.4  Xarelto for VTE prophylaxis Diet Heart Room service appropriate?: Yes; Fluid consistency:: Thin  xarelto ( rivaroxaban) prior to admission, now on xarelto ( rivaroxaban) and aspirin 325 mg daily  Patient counseled to be compliant with his antithrombotic medications  Ongoing aggressive stroke risk factor management  Therapy recommendations: No follow-up physical therapy recommended  Disposition:  Probable discharge today  Hypertension  Home meds: Lisinopril  Stable off lisinopril   Hyperlipidemia  Home meds: No lipid lowering medications prior to admission  LDL 75 goal < 70  Consider low-dose statin  Continue statin at discharge    Other Stroke Risk Factors  Advanced age  Cigarette smoker, quit smoking advised to stop smoking.  ETOH use  Obesity, Body mass index is 34.64 kg/(m^2).   Coronary artery disease  Obstructive sleep apnea, on CPAP at home  Previous stroke   Other Active Problems  Renal insufficiency - improved off ACE inhibitor   Other Pertinent History  Previous stroke  PLAN  The patient is on Xarelto and aspirin 325 mg daily. Does not need aspirin with Xarelto from stroke  standpoint; however, he may need aspirin for his coronary artery disease.  The stroke team will sign off at this time. Please call us for further assistance as needed.    Hospital day # 2  Mikey Bussing PA-C [facility] Pager [phone] 08/03/2014, 3:38 PM I have personally examined this patient, reviewed notes, independently viewed imaging studies, participated in medical decision making and plan of care. I have made any additions or clarifications directly to the above note. Agree with note above.  Stroke service will sign off. Kindly call for questions. Addendum : MRA brain shows Critical focal stenosis of the supraclinoid internal carotid artery on the right, consistent with atherosclerotic disease.Negative for carotid or  vertebral artery dissection. Mild to moderate stenosis proximal right vertebral artery. Recommend continuing ongoing medical therapy with aggressive risk factor modification. Elective intracranial stenting has not been shown to be superior to medical therapy and hence we will not pursue that at the present time  Antony Contras, [place] Pager: [phone] 08/03/2014 4:15 PM     To contact Stroke Continuity provider, please refer to [email or web address] After hours, contact General Neurology

## 2014-08-03 NOTE — Progress Notes (Signed)
Pt is being discharged home. Discharge instructions were given to patient and family 

## 2014-08-03 NOTE — Discharge Instructions (Signed)
Stroke Prevention Some health problems and behaviors may make it more likely for you to have a stroke. Below are ways to lessen your risk of having a stroke.   Be active for at least 30 minutes on most or all days.  Do not smoke. Try not to be around others who smoke.  Do not drink too much alcohol.  Do not have more than 2 drinks a day if you are a man.  Do not have more than 1 drink a day if you are a woman and are not pregnant.  Eat healthy foods, such as fruits and vegetables. If you were put on a specific diet, follow the diet as told.  Keep your cholesterol levels under control through diet and medicines. Look for foods that are low in saturated fat, trans fat, cholesterol, and are high in fiber.  If you have diabetes, follow all diet plans and take your medicine as told.  If you have high blood pressure (hypertension), follow all diet plans and take your medicine as told.  Keep a healthy weight. Eat foods that are low in calories, salt, saturated fat, trans fat, and cholesterol.  Do not take drugs.  Avoid birth control pills, if this applies. Talk to your doctor about the risks of taking birth control pills.  Talk to your doctor if you have sleep problems (sleep apnea).  Take all medicine as told by your doctor.  You may be told to take aspirin or blood thinner medicine. Take this medicine as told by your doctor.  Understand your medicine instructions.  Make sure any other conditions you have are being taken care of. GET HELP RIGHT AWAY IF:  You suddenly lose feeling (you feel numb) or have weakness in your face, arm, or leg.  Your face or eyelid hangs down to one side.  You suddenly feel confused.  You have trouble talking (aphasia) or understanding what people are saying.  You suddenly have trouble seeing in one or both eyes.  You suddenly have trouble walking.  You are dizzy.  You lose your balance or your movements are clumsy (uncoordinated).  You  suddenly have a very bad headache and you do not know the cause.  You have new chest pain.  Your heart feels like it is fluttering or skipping a beat (irregular heartbeat). Do not wait to see if the symptoms above go away. Get help right away. Call your local emergency services (911 in [place] Do not drive yourself to the hospital. Document Released: 07/28/2011 Document Revised: 06/12/2013 Document Reviewed: 07/29/2012 [facility] Patient Information 2015 [place], [place]. This information is not intended to replace advice given to you by your health care provider. Make sure you discuss any questions you have with your health care provider.  Rivaroxaban oral tablets What is this medicine? RIVAROXABAN (ri va ROX a ban) is an anticoagulant (blood thinner). It is used to treat blood clots in the lungs or in the veins. It is also used after knee or hip surgeries to prevent blood clots. It is also used to lower the chance of stroke in people with a medical condition called atrial fibrillation. This medicine may be used for other purposes; ask your health care provider or pharmacist if you have questions. COMMON BRAND NAME(S): Xarelto, Xarelto Starter Pack What should I tell my health care provider before I take this medicine? They need to know if you have any of these conditions: -bleeding disorders -bleeding in the brain -blood in your stools (black or  tarry stools) or if you have blood in your vomit -history of stomach bleeding -kidney disease -liver disease -low blood counts, like low white cell, platelet, or red cell counts -recent or planned spinal or epidural procedure -take medicines that treat or prevent blood clots -an unusual or allergic reaction to rivaroxaban, other medicines, foods, dyes, or preservatives -pregnant or trying to get pregnant -breast-feeding How should I use this medicine? Take this medicine by mouth with a glass of water. Follow the directions on the prescription  label. Take your medicine at regular intervals. Do not take it more often than directed. Do not stop taking except on your doctor's advice. Stopping this medicine may increase your risk of a blot clot. Be sure to refill your prescription before you run out of medicine. If you are taking this medicine after hip or knee replacement surgery, take it with or without food. If you are taking this medicine for atrial fibrillation, take it with your evening meal. If you are taking this medicine to treat blood clots, take it with food at the same time each day. If you are unable to swallow your tablet, you may crush the tablet and mix it in applesauce. Then, immediately eat the applesauce. You should eat more food right after you eat the applesauce containing the crushed tablet. Talk to your pediatrician regarding the use of this medicine in children. Special care may be needed. Overdosage: If you think you have taken too much of this medicine contact a poison control center or emergency room at once. NOTE: This medicine is only for you. Do not share this medicine with others. What if I miss a dose? If you take your medicine once a day and miss a dose, take the missed dose as soon as you remember. If you take your medicine twice a day and miss a dose, take the missed dose immediately. In this instance, 2 tablets may be taken at the same time. The next day you should take 1 tablet twice a day as directed. What may interact with this medicine? -aspirin and aspirin-like medicines -certain antibiotics like erythromycin, azithromycin, and clarithromycin -certain medicines for fungal infections like ketoconazole and itraconazole -certain medicines for irregular heart beat like amiodarone, quinidine, dronedarone -certain medicines for seizures like carbamazepine, phenytoin -certain medicines that treat or prevent blood clots like warfarin, enoxaparin, and  dalteparin -conivaptan -diltiazem -felodipine -indinavir -lopinavir; ritonavir -NSAIDS, medicines for pain and inflammation, like ibuprofen or naproxen -ranolazine -rifampin -ritonavir -St. John's wort -verapamil This list may not describe all possible interactions. Give your health care provider a list of all the medicines, herbs, non-prescription drugs, or dietary supplements you use. Also tell them if you smoke, drink alcohol, or use illegal drugs. Some items may interact with your medicine. What should I watch for while using this medicine? Visit your doctor or health care professional for regular checks on your progress. Your condition will be monitored carefully while you are receiving this medicine. Notify your doctor or health care professional and seek emergency treatment if you develop breathing problems; changes in vision; chest pain; severe, sudden headache; pain, swelling, warmth in the leg; trouble speaking; sudden numbness or weakness of the face, arm, or leg. These can be signs that your condition has gotten worse. If you are going to have surgery, tell your doctor or health care professional that you are taking this medicine. Tell your health care professional that you use this medicine before you have a spinal or epidural procedure. Sometimes people  who take this medicine have bleeding problems around the spine when they have a spinal or epidural procedure. This bleeding is very rare. If you have a spinal or epidural procedure while on this medicine, call your health care professional immediately if you have back pain, numbness or tingling (especially in your legs and feet), muscle weakness, paralysis, or loss of bladder or bowel control. Avoid sports and activities that might cause injury while you are using this medicine. Severe falls or injuries can cause unseen bleeding. Be careful when using sharp tools or knives. Consider using an Copy. Take special care brushing  or flossing your teeth. Report any injuries, bruising, or red spots on the skin to your doctor or health care professional. What side effects may I notice from receiving this medicine? Side effects that you should report to your doctor or health care professional as soon as possible: -allergic reactions like skin rash, itching or hives, swelling of the face, lips, or tongue -back pain -redness, blistering, peeling or loosening of the skin, including inside the mouth -signs and symptoms of bleeding such as bloody or black, tarry stools; red or dark-brown urine; spitting up blood or brown material that looks like coffee grounds; red spots on the skin; unusual bruising or bleeding from the eye, gums, or nose Side effects that usually do not require medical attention (Report these to your doctor or health care professional if they continue or are bothersome.): -dizziness -muscle pain This list may not describe all possible side effects. Call your doctor for medical advice about side effects. You may report side effects to FDA at 1-800-FDA-1088. Where should I keep my medicine? Keep out of the reach of children. Store at room temperature between 15 and 30 degrees C (59 and 86 degrees F). Throw away any unused medicine after the expiration date. NOTE: This sheet is a summary. It may not cover all possible information. If you have questions about this medicine, talk to your doctor, pharmacist, or health care provider.  2015, [employer]. (2013-05-18 18:47:48)

## 2014-08-03 NOTE — Evaluation (Signed)
Occupational Therapy Evaluation Patient Details Name: [name] MRN: [id number] DOB: [birth year] Today's Date: 08/03/2014    History of Present Illness Patient is a 64 y/o male admitted for LUE weakness/numbness and slurred speech which has since resolved after arriving to ED. MRI- + Rt frontal lobe infarct, watershed btw ACA/MCA. PMH includes alcoholism, PVD, COPD, CAD, MI, HTN.    Clinical Impression   Pt reports "feeling fine" and all symptoms have resolved. No weakness or sensation deficits noted. At baseline, Pt uses cane for ambulation and has home aide to A with housework, meal prep, tub transfer and transportation. Pt reports managing medications independently. Pt is able to complete ADLs with supervision and no LOB. No further OT needed at this time.     Follow Up Recommendations  No OT follow up;Supervision - Intermittent    Equipment Recommendations  None recommended by OT    Recommendations for Other Services       Precautions / Restrictions Precautions Precautions: None Restrictions Weight Bearing Restrictions: No      Mobility Bed Mobility Overal bed mobility: Modified Independent                Transfers Overall transfer level: Modified independent Equipment used: Straight cane                  Balance Overall balance assessment: Needs assistance Sitting-balance support: Feet supported;No upper extremity supported       Standing balance support: No upper extremity supported;During functional activity                 High level balance activites: Side stepping;Direction changes;Turns;Sudden stops;Head turns High Level Balance Comments: no LOB during high level activities            ADL Overall ADL's : Needs assistance/impaired Eating/Feeding: Independent;Sitting   Grooming: Wash/dry hands;Wash/dry face;Brushing hair;Supervision/safety;Standing   Upper Body Bathing: Supervision/ safety;Standing   Lower Body Bathing:  Supervison/ safety;Sit to/from stand   Upper Body Dressing : Supervision/safety;Sitting   Lower Body Dressing: Supervision/safety;Sit to/from stand   Toilet Transfer: Supervision/safety;Ambulation;Regular Toilet   Toileting- Water quality scientist and Hygiene: Independent (standing)   Tub/ Shower Transfer: Tub transfer;Min guard;Ambulation;Grab bars   Functional mobility during ADLs: Supervision/safety;Cane General ADL Comments: Pt reports aide provides A with tub transfer at home     Vision Vision Assessment?: No apparent visual deficits   Perception     Praxis      Pertinent Vitals/Pain Pain Assessment: No/denies pain     Hand Dominance Right   Extremity/Trunk Assessment Upper Extremity Assessment Upper Extremity Assessment: Overall WFL for tasks assessed   Lower Extremity Assessment Lower Extremity Assessment: Overall WFL for tasks assessed       Communication Communication Communication: HOH   Cognition Arousal/Alertness: Awake/alert Behavior During Therapy: WFL for tasks assessed/performed Overall Cognitive Status: Within Functional Limits for tasks assessed                     General Comments       Exercises       Shoulder Instructions      Home Living Family/patient expects to be discharged to:: Private residence Living Arrangements: Alone Available Help at Discharge: Personal care attendant Type of Home: Apartment Home Access: Elevator     Home Layout: One level     Bathroom Shower/Tub: Tub/shower unit;Curtain Shower/tub characteristics: Curtain       Home Equipment: Cane - single point;Grab bars - tub/shower  Prior Functioning/Environment Level of Independence: Needs assistance  Gait / Transfers Assistance Needed: Uses SPC at all times when walking.  ADL's / Homemaking Assistance Needed: Aide assists with housework, meal prep and transportation        OT Diagnosis: Generalized weakness   OT Problem List:  Decreased strength;Impaired balance (sitting and/or standing);Decreased safety awareness   OT Treatment/Interventions:      OT Goals(Current goals can be found in the care plan section) Acute Rehab OT Goals Patient Stated Goal: to go home OT Goal Formulation: With patient Time For Goal Achievement: 08/17/14 Potential to Achieve Goals: Good  OT Frequency:     Barriers to D/C:            Co-evaluation              End of Session Equipment Utilized During Treatment: Gait belt [name]) Nurse Communication: Mobility status  Activity Tolerance: Patient tolerated treatment well Patient left: in chair;with call bell/phone within reach;with chair alarm set   Time: [phone] OT Time Calculation (min): 21 min Charges:  OT General Charges $OT Visit: 1 Procedure OT Evaluation $Initial OT Evaluation Tier I: 1 Procedure G-Codes:    [name] 08/03/2014, 9:16 AM

## 2014-08-03 NOTE — Progress Notes (Signed)
Family Medicine Teaching Service Daily Progress Note Intern Pager: [phone]  Patient name: [name] Medical record number: [id number] Date of birth: [birth year] Age: 64 y.o. Gender: male  Primary Care Provider: [name], MD Consultants: Neurology  Code Status: DNR  Pt Overview and Major Events to Date:  6/22: Admitted with L sided weakness/numbness concerning for stroke. R frontal lobe CVA   Assessment and Plan: CLAXTON LEVITZ is a 64 y.o. male presenting with recurrent transient right upper extremity weakness found to have a right frontal lobe CVA. PMH is significant for CAD s/p cath, bilateral PAD, HLD, HFpEF, tobacco dependence, HTN, COPD, DVT, PE, s/p fem-pop  # CVA, right frontal lobe: per radiology report, located in a watershed area. Most likely reason for patient's symptoms which are most likely precipitated by decreased blood pressure to the area. MRI/MRA revealed a high-grade right paraclinoid ICA and moderate right A1 segment stenoses that correlate with suspected watershed infarcts. MRI also revealed mild luminal irregularity and narrowing of the upper right cervical ICA-  likely atherosclerotic (calcified plaque present on head CT), but suggested to consider neck angiography to exclude a dissection or proximal stenosis with underfilling: per Dr. Leonie Man, no need to f/u with neck angiography. Carotid dopplers revealed 1-39% stenosis of ICAs bilaterally.  Pt's ASCVD 54yrrisk is 25.7% therefore he should be on statin.  - continue to monitor on telemetry - appreciate neurology recommendations: from note 6/23 he is at risk for neurological worsening and needs ongoing stroke evaluation. Will defer to them on discharge and when to resume anti-hypertensives.  - holding  lisinopril - ASA '325mg'$   - atorvastatin '40mg'$  daily  - PT/OT eval: No f/u required   # AKI: SCr 1.8 on admission, most likely secondary to prerenal cause. Baseline appears to be below 1. S/p NS boluses in  the ED. - back to baseline. - continue to monitor BMP.  # COPD: Patient has not been taking Dulera or Spiriva. He has been taking albuterol. Exam only significant for mild wheezing. Currently on room air and comfortable. Chest x-ray clear. - start guaifenesin '400mg'$  q4hrs PRN - continue home Dulera - continue home Spiriva - continue home Albuterol  # HFpEF: EF of 60-65% in 2014. Does not appear overloaded. BNP of 42.4 on admission - hold home lasix - repeat echo with EF 50-55% and G1DD.   # Hypertension: blood pressure is 160s-120s/70s-50 without medications.  - hold lisinopril as above - given concerns of hypotension as the etiology, will not drop BPs too low.  # Hx of DVT and PE: was on lifelong Xarelto, however had not taken since about March or April secondary to rectal bleeding. - re-started xarelto 6/23.   # Tobacco dependence: currently smokes 1.5 cigars daily - nicotine patch '14mg'$  - Smoking cessation teaching  # Hx of polysubstance abuse (alcohol/cocaine most recently): last drink stated to be around June 4th. States he used to drink a pint of vodka twice a month before then. States he has not used any other drugs since he was young in his teens.  - UDS positive for benzodiazepines (was not prescribed or given these) - CIWA - thiamine - multivitamin - folate - CSW consult: denied substance abuse.   #Psych: Pt had nortriptyline on his med list, however stated he's not taking this. - will continue to monitor.   # OSA: history of CPAP usage at home, although currently not using because of improper fit - CPAP qhs  FEN/GI: IV saline lock, Heart healthy  diet PPx: xarelto   Disposition: Pending stroke work up, possible today vs tomorrow, pending neurology recs.  Subjective:  Patient feels he's back to baseline. Eager to go home.   Objective: Temp:  [97.7 F (36.5 C)-98.6 F (37 C)] 97.8 F (36.6 C) (06/24 0533) Pulse Rate:  [79-94] 84 (06/24 0533) Resp:  [16-20]  18 (06/24 0533) BP: (117-160)/(49-80) 154/76 mmHg (06/24 0533) SpO2:  [95 %-98 %] 97 % (06/24 0533) Physical Exam: General: Lying back in bed awake in NAD Cardiovascular: RRR, no m/r/g noted. Trace pitting edema b/l. No JVD Respiratory: Non-labored. CTAB without wheezing, rhonchi, or crackles noted.  Abdomen: +BS, soft, non-distended, non-tender. Neuro: A&O x4. Speech clear. No word finding difficulty. PERRL. EOMI, Facial movements symmetric, tongue and uvula midline. Sensation intact in V1-V3 over the face. Shoulder shrug intact. 5/5 strength in the UE and LE bilaterally. Normal bulk and tone w/o fasciculations. Sensation intact to light touch on LE and UE b/l. No dysmetria with FNF.   Laboratory:  Recent Labs Lab 08/01/14 1550 08/01/14 1606 08/03/14 0417  WBC 8.0  --  6.7  HGB 16.4 18.0* 14.8  HCT 47.7 53.0* 44.5  PLT 152  --  127*    Recent Labs Lab 08/01/14 1550 08/01/14 1606 08/02/14 0445 08/03/14 0417  NA 138 138 142 144  K 4.2 4.1 4.1 4.2  CL 103 101 107 107  CO2 25  --  25 27  BUN 23* 27* 17 12  CREATININE 1.76* 1.80* 0.97 0.69  CALCIUM 10.1  --  9.9 10.1  PROT 7.1  --   --   --   BILITOT 0.8  --   --   --   ALKPHOS 67  --   --   --   ALT 34  --   --   --   AST 28  --   --   --   GLUCOSE 102* 99 116* 113*   . Risk Stratification Labs  TSH    Component Value Date/Time   TSH 1.399 04/10/2013 0427   Hemoglobin A1C    Component Value Date/Time   HGBA1C 6.4* 08/02/2014 0445   Lipid Panel     Component Value Date/Time   CHOL 130 08/02/2014 0445   TRIG 125 08/02/2014 0445   HDL 30* 08/02/2014 0445   CHOLHDL 4.3 08/02/2014 0445   VLDL 25 08/02/2014 0445   LDLCALC 75 08/02/2014 0445      Imaging/Diagnostic Tests: Dg Chest 2 View  08/01/2014   CLINICAL DATA:  Left arm weakness and numbness. Productive cough for 3 weeks.  EXAM: CHEST  2 VIEW  COMPARISON:  06/23/2014 and 06/18/2014  FINDINGS: Heart size and pulmonary vascularity are normal. The lungs  are clear. There is a hiatal hernia. The CT scan of 12/12/2012 demonstrated that the Nissen fundoplication is herniated above the diaphragm. The wrap was intact at that time.  No acute osseous abnormality.  IMPRESSION: No acute abnormality.   Electronically Signed   By: Lorriane Shire M.D.   On: 08/01/2014 16:36   Ct Head (brain) Wo Contrast  08/01/2014   CLINICAL DATA:  Left arm weakness starting last night  EXAM: CT HEAD WITHOUT CONTRAST  TECHNIQUE: Contiguous axial images were obtained from the base of the skull through the vertex without intravenous contrast.  COMPARISON:  04/10/2013  FINDINGS: No skull fracture is noted. Paranasal sinuses shows mucosal thickening left maxillary sinus. Stable postsurgical changes left orbital floor and medial orbit.  Mild atherosclerotic calcifications of  carotid siphon.  No intracranial hemorrhage, mass effect or midline shift. Stable cerebral atrophy. Stable mild subcortical chronic white matter disease. No definite evidence of acute cortical infarction. No mass lesion is noted on this unenhanced scan. Ventricular size is stable from prior exam.  IMPRESSION: No acute intracranial abnormality. Stable chronic changes as described above.   Electronically Signed   By: Lahoma Crocker M.D.   On: 08/01/2014 16:22   Mr Jodene Nam Head Wo Contrast  08/02/2014   CLINICAL DATA:  New onset left-sided weakness.  EXAM: MRA HEAD WITHOUT CONTRAST  TECHNIQUE: Angiographic images of the Circle of Willis were obtained using MRA technique without intravenous contrast.  COMPARISON:  08/01/2014  FINDINGS: Motion degraded study which could obscure findings, especially in the medium and small size arteries.  Symmetric vertebral arteries. There is early branching of the left PICA, near the dural penetration. Symmetric carotid arteries. A right posterior communicating and anterior communicating artery is present.  The right cervical ICA is mildly narrowed and irregular in luminal contour compared to the  left. No intramural hematoma seen on prior conventional MRI. There is mural calcification medially at this level, consistent with atherosclerotic plaque.  There is a focal high-grade stenosis of the right paraclinoid ICA with flow gap. Symmetric flow is present within the downstream vessels. There is also a moderate proximal stenosis involving the right A1 segment. Anterior communicating artery and right posterior communicating arteries may compensate for the stenoses. There is no evidence of aneurysm. No major branch occlusion noted. No posterior circulation stenosis.  IMPRESSION: 1. High-grade right paraclinoid ICA and moderate right A1 segment stenoses. These correlate with suspected watershed infarcts on yesterday's brain MRI. 2. Mild luminal irregularity and narrowing of the upper right cervical ICA. This is likely atherosclerotic (calcified plaque present on head CT), but consider neck angiography to exclude a dissection or proximal stenosis with underfilling.   Electronically Signed   By: Monte Fantasia M.D.   On: 08/02/2014 10:54   Mr Brain Wo Contrast  08/01/2014   CLINICAL DATA:  Acute onset of left arm weakness and numbness beginning at 18 30 last evening. The patient was diaphoretic and hypotensive at the time.  EXAM: MRI HEAD WITHOUT CONTRAST  TECHNIQUE: Multiplanar, multiecho pulse sequences of the brain and surrounding structures were obtained without intravenous contrast.  COMPARISON:  CT head without contrast from the same day. MRI brain 04/11/2013.  FINDINGS: At least 5 different punctate foci of restricted diffusion are present within the right frontal lobe along a watershed distribution between the [facility] and MCA territories.  Moderate generalized atrophy is present. Moderate periventricular and scattered subcortical T2 hyperintensities are present bilaterally. No acute hemorrhage is present. A 7 mm dural-based lesion over the left frontal convexity likely represents a small meningioma.  Flow is  present in the major intracranial arteries. A right lens replacement is noted. The globes and orbits are otherwise intact. Chronic left ethmoid and maxillary sinus opacification is noted. The paranasal sinuses are otherwise clear. The mastoid air cells are clear.  Skullbase is unremarkable. Midline structures demonstrate fusion across the C2-3 disc space. Intracranial structures are within normal limits.  IMPRESSION: 1. At least 5 punctate nonhemorrhagic infarcts over the right frontal lobe within a watershed distribution between the [place] and MCA territories. This corresponds with the hypotensive episode and left arm symptoms. 2. Moderate atrophy and diffuse white matter disease is advanced for age. This likely reflects the sequela of chronic microvascular ischemia. 3. 7 mm dural-based lesion over the  left frontal convexity likely represents a meningioma. 4. Chronic left ethmoid and maxillary sinus disease. These results were called by telephone at the time of interpretation on 08/01/2014 at 9:09 pm to Dr. Roderic Palau , who verbally acknowledged these results.   Electronically Signed   By: San Morelle M.D.   On: 08/01/2014 21:09   Echocardiogram: EF 50-55%. No RWMA. Grade 1 diastolic dysfunction.  Archie Patten, MD 08/03/2014, 6:53 AM PGY-1, [place] Intern pager: [phone], text pages welcome

## 2014-08-03 NOTE — Discharge Summary (Signed)
[facility] Discharge Summary  Patient name: [name] Medical record number: [id number] Date of birth: 1950-11-02 Age: 64 y.o. Gender: male Date of Admission: 08/01/2014  Date of Discharge: 08/03/14 Admitting Physician: Kinnie Feil, MD  Primary Care Provider: [name], MD Consultants: Neurology   Indication for Hospitalization: L sided weakness/numbness   Discharge Diagnoses/Problem List:  Right frontal lobe CVA AKI COPD HFpEF HTN H/o DVT and PE Tobacco dependence  History of polysubstance abuse (alcohol and cocaine) OSA  Disposition: home   Discharge Condition: stable, improved     Brief Hospital Course:  [name] is a 64 y/o with a PMH HTN, CAD, PAD, HFpEF, tobacco use, h/o DVT/PE last in 02/2014 presenting with left arm weakness and numbness which radiated to his neck. On arrival to the ED, labs were remarkable for AKI with a creatinine of 1.8. BNP was 42.4. Troponin negative. EKG NSR. MRI significant for 5 punctate right frontal lobe lesions around a watershed area. He passed a swallow screen in the ED. Neurology was consulted along with FMTS for admission.   Overnight he returned back to his baseline.  His Lasix and lisinopril were held and he was started on ASA and Lipitor.  MRI/MRA revealed a high-grade right paraclinoid ICA and moderate right A1 segment stenoses that correlate with suspected watershed infarcts. Carotids were negative with 1-39% stenosis of ICAs bilaterally. Echo was stable with an EF of 50-55% and grade 1 diastolic dysfunction. The patient had self-discontinued xarelto several months earlier without informing his PCP as he was experiencing rectal bleeding. He was amenable to restarting this in the hospital under a controlled setting. He was not agreeable to a colonoscopy. Smoking cessation counseling was performed as well.  Additionally, MRI also revealed mild luminal irregularity and narrowing of the upper  right cervical ICA- likely atherosclerotic (calcified plaque present on head CT), but suggested to consider neck angiography to exclude a dissection or proximal stenosis with underfilling. A MRA of his neck revealed cortical focal stenosis of the supraclinoid ICA of the R, consistent with atherosclerotic disease and negative for dissection   He was cleared by speech, OT, and PT and was back to his baseline on the day of discharge.   Of note, the patient did not appear fluid overloaded and his weight was stable therefore his lasix and KDUR were held.   Issues for Follow Up:  1. Follow up BPs- discharged home with lisinopril '10mg'$ , however made lasix as needed given pt has not appeared volume overloaded on exam.  2. Stress smoking cessation  3. Follow up symptoms to see if pt develops rectal bleeding again on Xarelto and discuss compliance.  4. Continue to monitor fluid status an consider changing Lasix back from PRN to scheduled.   Significant Procedures: None   Significant Labs and Imaging:   Recent Labs Lab 08/01/14 1550 08/01/14 1606 08/03/14 0417  WBC 8.0  --  6.7  HGB 16.4 18.0* 14.8  HCT 47.7 53.0* 44.5  PLT 152  --  127*    Recent Labs Lab 08/01/14 1550 08/01/14 1606 08/02/14 0445 08/03/14 0417  NA 138 138 142 144  K 4.2 4.1 4.1 4.2  CL 103 101 107 107  CO2 25  --  25 27  GLUCOSE 102* 99 116* 113*  BUN 23* 27* 17 12  CREATININE 1.76* 1.80* 0.97 0.69  CALCIUM 10.1  --  9.9 10.1  ALKPHOS 67  --   --   --  AST 28  --   --   --   ALT 34  --   --   --   ALBUMIN 3.9  --   --   --     Risk Stratification Labs  TSH    Component Value Date/Time   TSH 1.399 04/10/2013 0427   Hemoglobin A1C    Component Value Date/Time   HGBA1C 6.4* 08/02/2014 0445   Lipid Panel     Component Value Date/Time   CHOL 130 08/02/2014 0445   TRIG 125 08/02/2014 0445   HDL 30* 08/02/2014 0445   CHOLHDL 4.3 08/02/2014 0445   VLDL 25 08/02/2014 0445   LDLCALC 75 08/02/2014 0445      Dg Chest 2 View  08/01/2014 CLINICAL DATA: Left arm weakness and numbness. Productive cough for 3 weeks. EXAM: CHEST 2 VIEW COMPARISON: 06/23/2014 and 06/18/2014 FINDINGS: Heart size and pulmonary vascularity are normal. The lungs are clear. There is a hiatal hernia. The CT scan of 12/12/2012 demonstrated that the Nissen fundoplication is herniated above the diaphragm. The wrap was intact at that time. No acute osseous abnormality. IMPRESSION: No acute abnormality. Electronically Signed By: Lorriane Shire M.D. On: 08/01/2014 16:36   Ct Head (brain) Wo Contrast  08/01/2014 CLINICAL DATA: Left arm weakness starting last night EXAM: CT HEAD WITHOUT CONTRAST TECHNIQUE: Contiguous axial images were obtained from the base of the skull through the vertex without intravenous contrast. COMPARISON: 04/10/2013 FINDINGS: No skull fracture is noted. Paranasal sinuses shows mucosal thickening left maxillary sinus. Stable postsurgical changes left orbital floor and medial orbit. Mild atherosclerotic calcifications of carotid siphon. No intracranial hemorrhage, mass effect or midline shift. Stable cerebral atrophy. Stable mild subcortical chronic white matter disease. No definite evidence of acute cortical infarction. No mass lesion is noted on this unenhanced scan. Ventricular size is stable from prior exam. IMPRESSION: No acute intracranial abnormality. Stable chronic changes as described above. Electronically Signed By: Lahoma Crocker M.D. On: 08/01/2014 16:22   Mr Jodene Nam Head Wo Contrast  08/02/2014 CLINICAL DATA: New onset left-sided weakness. EXAM: MRA HEAD WITHOUT CONTRAST TECHNIQUE: Angiographic images of the Circle of Willis were obtained using MRA technique without intravenous contrast. COMPARISON: 08/01/2014 FINDINGS: Motion degraded study which could obscure findings, especially in the medium and small size arteries. Symmetric vertebral arteries. There is early branching  of the left PICA, near the dural penetration. Symmetric carotid arteries. A right posterior communicating and anterior communicating artery is present. The right cervical ICA is mildly narrowed and irregular in luminal contour compared to the left. No intramural hematoma seen on prior conventional MRI. There is mural calcification medially at this level, consistent with atherosclerotic plaque. There is a focal high-grade stenosis of the right paraclinoid ICA with flow gap. Symmetric flow is present within the downstream vessels. There is also a moderate proximal stenosis involving the right A1 segment. Anterior communicating artery and right posterior communicating arteries may compensate for the stenoses. There is no evidence of aneurysm. No major branch occlusion noted. No posterior circulation stenosis. IMPRESSION: 1. High-grade right paraclinoid ICA and moderate right A1 segment stenoses. These correlate with suspected watershed infarcts on yesterday's brain MRI. 2. Mild luminal irregularity and narrowing of the upper right cervical ICA. This is likely atherosclerotic (calcified plaque present on head CT), but consider neck angiography to exclude a dissection or proximal stenosis with underfilling. Electronically Signed By: Monte Fantasia M.D. On: 08/02/2014 10:54   Mr Brain Wo Contrast  08/01/2014 CLINICAL DATA: Acute onset of left arm weakness  and numbness beginning at 18 30 last evening. The patient was diaphoretic and hypotensive at the time. EXAM: MRI HEAD WITHOUT CONTRAST TECHNIQUE: Multiplanar, multiecho pulse sequences of the brain and surrounding structures were obtained without intravenous contrast. COMPARISON: CT head without contrast from the same day. MRI brain 04/11/2013. FINDINGS: At least 5 different punctate foci of restricted diffusion are present within the right frontal lobe along a watershed distribution between the [facility] and MCA territories. Moderate generalized atrophy  is present. Moderate periventricular and scattered subcortical T2 hyperintensities are present bilaterally. No acute hemorrhage is present. A 7 mm dural-based lesion over the left frontal convexity likely represents a small meningioma. Flow is present in the major intracranial arteries. A right lens replacement is noted. The globes and orbits are otherwise intact. Chronic left ethmoid and maxillary sinus opacification is noted. The paranasal sinuses are otherwise clear. The mastoid air cells are clear. Skullbase is unremarkable. Midline structures demonstrate fusion across the C2-3 disc space. Intracranial structures are within normal limits. IMPRESSION: 1. At least 5 punctate nonhemorrhagic infarcts over the right frontal lobe within a watershed distribution between the [place] and MCA territories. This corresponds with the hypotensive episode and left arm symptoms. 2. Moderate atrophy and diffuse white matter disease is advanced for age. This likely reflects the sequela of chronic microvascular ischemia. 3. 7 mm dural-based lesion over the left frontal convexity likely represents a meningioma. 4. Chronic left ethmoid and maxillary sinus disease. These results were called by telephone at the time of interpretation on 08/01/2014 at 9:09 pm to Dr. Roderic Palau , who verbally acknowledged these results. Electronically Signed By: San Morelle M.D. On: 08/01/2014 21:09   Echocardiogram: EF 50-55%. No RWMA. Grade 1 diastolic dysfunction.  Carotids: 1-39% stenosis of ICAs bilaterally.   Results/Tests Pending at Time of Discharge: none   Discharge Medications:    Medication List    STOP taking these medications        potassium chloride 10 MEQ tablet  Commonly known as:  K-DUR      TAKE these medications        albuterol 108 (90 BASE) MCG/ACT inhaler  Commonly known as:  PROVENTIL HFA;VENTOLIN HFA  Inhale 2 puffs into the lungs every 6 (six) hours as needed for wheezing or shortness of breath.      atorvastatin 40 MG tablet  Commonly known as:  LIPITOR  Take 1 tablet (40 mg total) by mouth daily at 6 PM.     BREATHERITE COLL SPACER ADULT Misc  1 Device by Does not apply route as needed (with inhaler).     clotrimazole-betamethasone cream  Commonly known as:  LOTRISONE  Apply 1 application topically 2 (two) times daily.     furosemide 40 MG tablet  Commonly known as:  LASIX  Take 1 tablet (40 mg total) by mouth daily as needed (swelling in legs or an increased weight by 3-4 pounds up from baseline).     gabapentin 100 MG capsule  Commonly known as:  NEURONTIN  Take 1 capsule (100 mg total) by mouth 3 (three) times daily.     lisinopril 20 MG tablet  Commonly known as:  PRINIVIL,ZESTRIL  Take 0.5 tablets (10 mg total) by mouth daily.     mometasone-formoterol 200-5 MCG/ACT Aero  Commonly known as:  DULERA  Inhale 2 puffs into the lungs 2 (two) times daily.     MULTIVITAMIN ADULT PO  Take 1 tablet by mouth daily.     nortriptyline 25 MG capsule  Commonly known as:  PAMELOR  Take 1 capsule (25 mg total) by mouth at bedtime.     omeprazole 20 MG capsule  Commonly known as:  PRILOSEC  Take 1 capsule (20 mg total) by mouth daily.     oxyCODONE-acetaminophen 5-325 MG per tablet  Commonly known as:  PERCOCET  Take 2 tablets by mouth every 6 (six) hours as needed for moderate pain.     Rivaroxaban 15 & 20 MG Tbpk  Commonly known as:  XARELTO STARTER PACK  '15mg'$  tablet by mouth twice a day with food change to 20 mg tab once daily on day 22.     tiotropium 18 MCG inhalation capsule  Commonly known as:  SPIRIVA  Place 1 capsule (18 mcg total) into inhaler and inhale daily.     vitamin B-12 100 MCG tablet  Commonly known as:  CYANOCOBALAMIN  Take 100 mcg by mouth daily.     VITAMIN B-6 PO  Take 1 tablet by mouth daily.     vitamin E 200 UNIT capsule  Take 200 Units by mouth daily.        Discharge Instructions: Please refer to Patient Instructions section  of EMR for full details.  Patient was counseled important signs and symptoms that should prompt return to medical care, changes in medications, dietary instructions, activity restrictions, and follow up appointments.   Follow-Up Appointments:     Follow-up Information    Follow up with [facility]     On 08/20/2014.   Why:  Appointment on 08/20/14 at 3:30 pm with Dr. Adrian Blackwater.   Contact information:   [address] [place] [address] [phone]      Archie Patten, MD 08/03/2014, 1:05 PM PGY-1, [place]

## 2014-08-09 DIAGNOSIS — J449 Chronic obstructive pulmonary disease, unspecified: Secondary | ICD-10-CM | POA: Diagnosis not present

## 2014-08-09 DIAGNOSIS — J439 Emphysema, unspecified: Secondary | ICD-10-CM | POA: Diagnosis not present

## 2014-08-09 DIAGNOSIS — J42 Unspecified chronic bronchitis: Secondary | ICD-10-CM | POA: Diagnosis not present

## 2014-08-10 ENCOUNTER — Inpatient Hospital Stay: Payer: Self-pay | Admitting: Family Medicine

## 2014-08-19 ENCOUNTER — Emergency Department (HOSPITAL_COMMUNITY): Payer: Medicare Other

## 2014-08-19 ENCOUNTER — Encounter (HOSPITAL_COMMUNITY): Payer: Self-pay | Admitting: Emergency Medicine

## 2014-08-19 ENCOUNTER — Inpatient Hospital Stay (HOSPITAL_COMMUNITY)
Admission: EM | Admit: 2014-08-19 | Discharge: 2014-08-20 | DRG: 064 | Disposition: A | Discharge status: Home / Self Care | Payer: Medicare Other | Attending: Family Medicine | Admitting: Family Medicine

## 2014-08-19 DIAGNOSIS — I2510 Atherosclerotic heart disease of native coronary artery without angina pectoris: Secondary | ICD-10-CM | POA: Diagnosis present

## 2014-08-19 DIAGNOSIS — Z86718 Personal history of other venous thrombosis and embolism: Secondary | ICD-10-CM | POA: Diagnosis not present

## 2014-08-19 DIAGNOSIS — Z8249 Family history of ischemic heart disease and other diseases of the circulatory system: Secondary | ICD-10-CM

## 2014-08-19 DIAGNOSIS — I959 Hypotension, unspecified: Secondary | ICD-10-CM | POA: Diagnosis present

## 2014-08-19 DIAGNOSIS — R200 Anesthesia of skin: Secondary | ICD-10-CM | POA: Diagnosis not present

## 2014-08-19 DIAGNOSIS — I252 Old myocardial infarction: Secondary | ICD-10-CM | POA: Diagnosis not present

## 2014-08-19 DIAGNOSIS — I6340 Cerebral infarction due to embolism of unspecified cerebral artery: Secondary | ICD-10-CM | POA: Diagnosis present

## 2014-08-19 DIAGNOSIS — F329 Major depressive disorder, single episode, unspecified: Secondary | ICD-10-CM | POA: Diagnosis present

## 2014-08-19 DIAGNOSIS — I10 Essential (primary) hypertension: Secondary | ICD-10-CM | POA: Diagnosis not present

## 2014-08-19 DIAGNOSIS — F419 Anxiety disorder, unspecified: Secondary | ICD-10-CM | POA: Diagnosis present

## 2014-08-19 DIAGNOSIS — F17210 Nicotine dependence, cigarettes, uncomplicated: Secondary | ICD-10-CM | POA: Diagnosis present

## 2014-08-19 DIAGNOSIS — J449 Chronic obstructive pulmonary disease, unspecified: Secondary | ICD-10-CM | POA: Diagnosis present

## 2014-08-19 DIAGNOSIS — I739 Peripheral vascular disease, unspecified: Secondary | ICD-10-CM | POA: Diagnosis present

## 2014-08-19 DIAGNOSIS — G459 Transient cerebral ischemic attack, unspecified: Secondary | ICD-10-CM | POA: Diagnosis not present

## 2014-08-19 DIAGNOSIS — J45909 Unspecified asthma, uncomplicated: Secondary | ICD-10-CM | POA: Diagnosis present

## 2014-08-19 DIAGNOSIS — Z888 Allergy status to other drugs, medicaments and biological substances status: Secondary | ICD-10-CM | POA: Diagnosis not present

## 2014-08-19 DIAGNOSIS — G4733 Obstructive sleep apnea (adult) (pediatric): Secondary | ICD-10-CM | POA: Diagnosis present

## 2014-08-19 DIAGNOSIS — E785 Hyperlipidemia, unspecified: Secondary | ICD-10-CM | POA: Diagnosis present

## 2014-08-19 DIAGNOSIS — R29810 Facial weakness: Secondary | ICD-10-CM | POA: Diagnosis not present

## 2014-08-19 DIAGNOSIS — Z86711 Personal history of pulmonary embolism: Secondary | ICD-10-CM

## 2014-08-19 DIAGNOSIS — N179 Acute kidney failure, unspecified: Secondary | ICD-10-CM | POA: Diagnosis present

## 2014-08-19 DIAGNOSIS — Z7951 Long term (current) use of inhaled steroids: Secondary | ICD-10-CM | POA: Diagnosis not present

## 2014-08-19 DIAGNOSIS — Z79891 Long term (current) use of opiate analgesic: Secondary | ICD-10-CM | POA: Diagnosis not present

## 2014-08-19 DIAGNOSIS — E860 Dehydration: Secondary | ICD-10-CM | POA: Diagnosis present

## 2014-08-19 DIAGNOSIS — K219 Gastro-esophageal reflux disease without esophagitis: Secondary | ICD-10-CM | POA: Diagnosis present

## 2014-08-19 DIAGNOSIS — R0602 Shortness of breath: Secondary | ICD-10-CM | POA: Diagnosis not present

## 2014-08-19 DIAGNOSIS — Z66 Do not resuscitate: Secondary | ICD-10-CM | POA: Diagnosis present

## 2014-08-19 DIAGNOSIS — I639 Cerebral infarction, unspecified: Secondary | ICD-10-CM | POA: Diagnosis not present

## 2014-08-19 DIAGNOSIS — F1020 Alcohol dependence, uncomplicated: Secondary | ICD-10-CM | POA: Diagnosis present

## 2014-08-19 DIAGNOSIS — I5033 Acute on chronic diastolic (congestive) heart failure: Secondary | ICD-10-CM | POA: Diagnosis present

## 2014-08-19 DIAGNOSIS — I5032 Chronic diastolic (congestive) heart failure: Secondary | ICD-10-CM | POA: Diagnosis present

## 2014-08-19 DIAGNOSIS — R05 Cough: Secondary | ICD-10-CM | POA: Diagnosis not present

## 2014-08-19 DIAGNOSIS — Z9119 Patient's noncompliance with other medical treatment and regimen: Secondary | ICD-10-CM | POA: Diagnosis present

## 2014-08-19 DIAGNOSIS — I638 Other cerebral infarction: Secondary | ICD-10-CM | POA: Diagnosis not present

## 2014-08-19 LAB — URINALYSIS, ROUTINE W REFLEX MICROSCOPIC
Glucose, UA: NEGATIVE mg/dL
Hgb urine dipstick: NEGATIVE
KETONES UR: NEGATIVE mg/dL
Leukocytes, UA: NEGATIVE
NITRITE: NEGATIVE
PH: 5 (ref 5.0–8.0)
Protein, ur: 30 mg/dL — AB
Specific Gravity, Urine: 1.02 (ref 1.005–1.030)
UROBILINOGEN UA: 0.2 mg/dL (ref 0.0–1.0)

## 2014-08-19 LAB — I-STAT TROPONIN, ED: TROPONIN I, POC: 0 ng/mL (ref 0.00–0.08)

## 2014-08-19 LAB — CBC
HCT: 50.4 % (ref 39.0–52.0)
HEMOGLOBIN: 16.9 g/dL (ref 13.0–17.0)
MCH: 31 pg (ref 26.0–34.0)
MCHC: 33.5 g/dL (ref 30.0–36.0)
MCV: 92.5 fL (ref 78.0–100.0)
Platelets: 166 10*3/uL (ref 150–400)
RBC: 5.45 MIL/uL (ref 4.22–5.81)
RDW: 14.1 % (ref 11.5–15.5)
WBC: 10.9 10*3/uL — AB (ref 4.0–10.5)

## 2014-08-19 LAB — I-STAT CHEM 8, ED
BUN: 14 mg/dL (ref 6–20)
CHLORIDE: 105 mmol/L (ref 101–111)
CREATININE: 1.6 mg/dL — AB (ref 0.61–1.24)
Calcium, Ion: 1.17 mmol/L (ref 1.13–1.30)
Glucose, Bld: 121 mg/dL — ABNORMAL HIGH (ref 65–99)
HCT: 53 % — ABNORMAL HIGH (ref 39.0–52.0)
Hemoglobin: 18 g/dL — ABNORMAL HIGH (ref 13.0–17.0)
POTASSIUM: 3.9 mmol/L (ref 3.5–5.1)
Sodium: 141 mmol/L (ref 135–145)
TCO2: 24 mmol/L (ref 0–100)

## 2014-08-19 LAB — DIFFERENTIAL
BASOS ABS: 0.1 10*3/uL (ref 0.0–0.1)
BASOS PCT: 1 % (ref 0–1)
EOS PCT: 3 % (ref 0–5)
Eosinophils Absolute: 0.3 10*3/uL (ref 0.0–0.7)
Lymphocytes Relative: 22 % (ref 12–46)
Lymphs Abs: 2.4 10*3/uL (ref 0.7–4.0)
MONO ABS: 1.3 10*3/uL — AB (ref 0.1–1.0)
Monocytes Relative: 12 % (ref 3–12)
Neutro Abs: 6.9 10*3/uL (ref 1.7–7.7)
Neutrophils Relative %: 62 % (ref 43–77)

## 2014-08-19 LAB — COMPREHENSIVE METABOLIC PANEL
ALBUMIN: 4.1 g/dL (ref 3.5–5.0)
ALT: 33 U/L (ref 17–63)
AST: 33 U/L (ref 15–41)
Alkaline Phosphatase: 70 U/L (ref 38–126)
Anion gap: 13 (ref 5–15)
BUN: 11 mg/dL (ref 6–20)
CALCIUM: 10.3 mg/dL (ref 8.9–10.3)
CHLORIDE: 105 mmol/L (ref 101–111)
CO2: 23 mmol/L (ref 22–32)
Creatinine, Ser: 1.69 mg/dL — ABNORMAL HIGH (ref 0.61–1.24)
GFR calc Af Amer: 47 mL/min — ABNORMAL LOW (ref >60)
GFR calc non Af Amer: 41 mL/min — ABNORMAL LOW (ref >60)
Glucose, Bld: 117 mg/dL — ABNORMAL HIGH (ref 65–99)
Potassium: 4 mmol/L (ref 3.5–5.1)
Sodium: 141 mmol/L (ref 135–145)
Total Bilirubin: 0.8 mg/dL (ref 0.3–1.2)
Total Protein: 7.3 g/dL (ref 6.5–8.1)

## 2014-08-19 LAB — RAPID URINE DRUG SCREEN, HOSP PERFORMED
Amphetamines: NOT DETECTED
BENZODIAZEPINES: POSITIVE — AB
Barbiturates: NOT DETECTED
Cocaine: POSITIVE — AB
OPIATES: NOT DETECTED
Tetrahydrocannabinol: NOT DETECTED

## 2014-08-19 LAB — URINE MICROSCOPIC-ADD ON

## 2014-08-19 LAB — I-STAT CG4 LACTIC ACID, ED
Lactic Acid, Venous: 1.44 mmol/L (ref 0.5–2.0)
Lactic Acid, Venous: 1.51 mmol/L (ref 0.5–2.0)

## 2014-08-19 LAB — PROTIME-INR
INR: 1.09 (ref 0.00–1.49)
Prothrombin Time: 14.3 seconds (ref 11.6–15.2)

## 2014-08-19 LAB — ETHANOL: Alcohol, Ethyl (B): <5 mg/dL (ref <5)

## 2014-08-19 LAB — BRAIN NATRIURETIC PEPTIDE: B NATRIURETIC PEPTIDE 5: 65.4 pg/mL (ref 0.0–100.0)

## 2014-08-19 LAB — APTT: aPTT: 28 seconds (ref 24–37)

## 2014-08-19 MED ORDER — SODIUM CHLORIDE 0.9 % IV SOLN
INTRAVENOUS | Status: DC
Start: 2014-08-19 — End: 2014-08-20
  Administered 2014-08-19 – 2014-08-20 (×3): via INTRAVENOUS

## 2014-08-19 MED ORDER — STROKE: EARLY STAGES OF RECOVERY BOOK
Freq: Once | Status: AC
  Administered 2014-08-19

## 2014-08-19 MED ORDER — SODIUM CHLORIDE 0.9 % IV BOLUS (SEPSIS)
500.0000 mL | Freq: Once | INTRAVENOUS | Status: AC
  Administered 2014-08-19: 500 mL via INTRAVENOUS

## 2014-08-19 NOTE — ED Notes (Signed)
Patient transported to MRI 

## 2014-08-19 NOTE — ED Notes (Signed)
Per EMS- Pt here two weeks ago for a bleed (pt reports) and was DC'd after two days. Same symptoms, left sided paralysis. Pt reports most of his left arm function returned. Today patient was mowing the grass and noticed his left arm was paralyzed and he could not move it. Pt was able to pivot and put weight on his leg. CBG was 116. NSR on monitor. Wheezing and ronchi to lungs, pt smokes. PIV 20G placed to right hand.

## 2014-08-19 NOTE — Consult Note (Signed)
Neurology Consultation Reason for Consult: Stroke Referring Physician: Zenia Resides, A  CC: Left arm weakness  History is obtained from:patient  HPI: [name] is a 64 y.o. male with a history of recent hospitaliziation due to stroke, embolic vs watershed form r supraclinoid ICA stenosis. He was on his lawnmower outside and had sudden worsening of his left sided weakness prompting him to come in as a code stroke. Due to recent stroke, not a tpa candidate and an MRI was obtained which shows new areas of infarct.   Of note, he states that he is not taking xarelto despite his DC summary indicating he was told to. He states that this is due to passing blood prior to his last admission, no recent GI bleeding since then.   He has not been taking aspirin either.    LKW: 2:30 pm tpa given?: no, recent stroke    ROS: A 14 point ROS was performed and is negative except as noted in the HPI.   Past Medical History  Diagnosis Date  . Depression   . Hypertension   . Hyperlipidemia   . Myocardial infarction 2000  . CHF (congestive heart failure)   . COPD (chronic obstructive pulmonary disease)   . Peripheral vascular disease, unspecified     08/20/10 doppler: increase in right ABI post-op. Left ABI stable. S/P bi-fem bypass surgery  . GERD (gastroesophageal reflux disease)   . Asthma   . Anxiety   . History of DVT of lower extremity   . CAD (coronary artery disease) 05/27/10    Cath: severe single vessell CAD left cx midportion obtuse marginal 2 to 3.  . Shortness of breath   . Pneumonia 04/04/2012  . Arthritis   . OSA on CPAP   . Hyperlipemia   . COPD (chronic obstructive pulmonary disease)   . Alcohol abuse     H/O  . Tobacco abuse   . Orbital fracture 12/2012    Family History: Heart disease  Social History: Tob: current smoker  Exam: Current vital signs: BP 118/65 mmHg  Pulse 81  Temp(Src) 97.5 F (36.4 C) (Oral)  Resp 18  Ht 6' (1.829 m)  Wt 115.667 kg (255 lb)   BMI 34.58 kg/m2  SpO2 99% Vital signs in last 24 hours: Temp:  [97.5 F (36.4 C)] 97.5 F (36.4 C) (07/10 1840) Pulse Rate:  [81-87] 81 (07/10 2007) Resp:  [17-20] 18 (07/10 2007) BP: (98-118)/(57-66) 118/65 mmHg (07/10 2007) SpO2:  [96 %-99 %] 99 % (07/10 2007) Weight:  [115.667 kg (255 lb)] 115.667 kg (255 lb) (07/10 1840)  Physical Exam  Constitutional: Appears obese Psych: Affect appropriate to situation Eyes: No scleral injection HENT: No OP obstrucion Head: Normocephalic.  Cardiovascular: Normal rate and regular rhythm.  Respiratory: Effort normal and breath sounds normal to anterior ascultation GI: Soft.  No distension. There is no tenderness.  Skin: WDI  Neuro: Mental Status: Patient is awake, alert, oriented to person, place, month, year, and situation. Patient is able to give a clear and coherent history. No signs of aphasia or neglect Cranial Nerves: II: Visual Fields are full. Pupils are equal, round, and reactive to light.   III,IV, VI: EOMI without ptosis or diploplia.  V: Facial sensation is symmetric to temperature VII: Facial movement is intact VIII: hearing is intact to voice X: Uvula elevates symmetrically XI: Shoulder shrug is symmetric. XII: tongue is midline without atrophy or fasciculations.  Motor: Tone is normal. Bulk is normal. 5/5 strength was present on  the right, on the left, he has 2/5 LUE and 4/5 LLE weakness.  Sensory: Sensation is symmetric to light touch and temperature in the arms but mildly diminished to pin in th eleft leg.  Cerebellar: FNF  intact on right, unable to perform on left.    I have reviewed labs in epic and the results pertinent to this consultation are: Elevated creatinine  I have reviewed the images obtained:MRI brain - new areas of infarct.   Impression: 64 yo M With multiple areas of intracranial stenosis who is presenting with extension of his strokes. Possibilities include hypotension causing watershed infarcts  in the distribution of his stenosis which could be supported if his elevate creatinine is due to dehydration. Also possible would be artery to artery embolus due to his non-compliance with xarelto.   I am concerned by his non-compliance. SAMMPRIS was a trial on intracranial stenosis randomizing to medical management vs stenting. It showed no benefit to stenting.  Medical management consisted of secondary risk factor control plus dual antiplatelet therapy(asa+plavix). WASID looked at aspirin vs warfarin and found similar efficacy with fewer side effects with aspirin than warfarin. Whether this is generalized to other anticoagulants such as xarelto is unclear, but in any case, if he is not going to take xarelto, maybe he could be convinced to use antiplatelet medications. Also, in the short term, there is some risk of hemorrhagic conversion with full anticoagulation.   Recommendations: 1) Hold lisinopril given hypotension could be responsible event.  2) Start ASA '325mg'$  + plavix '75mg'$  daily.  3) continue to encourage smoking cessation, lipid control. 4) Neurology will continue to follow.  5) PT,OT   Roland Rack, MD [facility] [phone]  If 7pm- 7am, please page neurology on call as listed in [facility].

## 2014-08-19 NOTE — ED Notes (Signed)
Attempted to give floor report, will call back.

## 2014-08-19 NOTE — ED Provider Notes (Signed)
CSN: [id number]     Arrival date & time 08/19/14  1816 History   First MD Initiated Contact with Patient 08/19/14 1826     Chief Complaint  Patient presents with  . Code Stroke     (Consider location/radiation/quality/duration/timing/severity/associated sxs/prior Treatment) HPI Comments: Patient here with acute onset of left upper extremity weakness was can approximately 4 hours ago. Patient states his running is lot more and noted that he couldn't control his left arm. Alsodiscuss some left lower extremity weakness as well 2. Mild headache without vomiting or confusion. Denies any chest or abdominal pain. Has had worsening chronic cough but no recent fever. No anginal type symptoms. EMS was called and patient transported here. No treatment used prior to arrival.  The history is provided by the patient.    Past Medical History  Diagnosis Date  . Depression   . Hypertension   . Hyperlipidemia   . Myocardial infarction 2000  . CHF (congestive heart failure)   . COPD (chronic obstructive pulmonary disease)   . Peripheral vascular disease, unspecified     08/20/10 doppler: increase in right ABI post-op. Left ABI stable. S/P bi-fem bypass surgery  . GERD (gastroesophageal reflux disease)   . Asthma   . Anxiety   . History of DVT of lower extremity   . CAD (coronary artery disease) 05/27/10    Cath: severe single vessell CAD left cx midportion obtuse marginal 2 to 3.  . Shortness of breath   . Pneumonia 04/04/2012  . Arthritis   . OSA on CPAP   . Hyperlipemia   . COPD (chronic obstructive pulmonary disease)   . Alcohol abuse     H/O  . Tobacco abuse   . Orbital fracture 12/2012   Past Surgical History  Procedure Laterality Date  . Femoral bypass  08/19/10    Right Fem-Pop  . Aortogram w/ ptca  09/292003  . Eye surgery    . Back surgery    . Total knee arthroplasty    . Cholecystectomy    . Cardiac catheterization  05/27/10    severe CAD left cx  . Abdoninal ao angio & bifem  angio  05/27/10    Patent graft, occluded bil stents with no retrograde flow into the hypogastric arteries. 100% occl left ant. tibial artery, 70% to 80% to 100% stenosis right superficial fem artery above adductor canal. 100 % occl right ant. tibial vessell  . Esophagogastric fundoplication     Family History  Problem Relation Age of Onset  . Heart attack Mother   . Cirrhosis Father   . Heart failure Mother   . Heart failure Brother   . Cancer Brother    History  Substance Use Topics  . Smoking status: Current Every Day Smoker -- 1.00 packs/day for 50 years    Types: Cigars, Cigarettes    Start date: 02/09/1961  . Smokeless tobacco: Former Systems developer    Types: Chew     Comment: 2 ppd, full flavor  . Alcohol Use: Yes     Comment: occ    Review of Systems  All other systems reviewed and are negative.     Allergies  Darvocet and Haldol  Home Medications   Prior to Admission medications   Medication Sig Start Date End Date Taking? Authorizing Provider  albuterol (PROVENTIL HFA;VENTOLIN HFA) 108 (90 BASE) MCG/ACT inhaler Inhale 2 puffs into the lungs every 6 (six) hours as needed for wheezing or shortness of breath. 11/21/13   Coral Spikes,  MD  atorvastatin (LIPITOR) 40 MG tablet Take 1 tablet (40 mg total) by mouth daily at 6 PM. 08/03/14   Archie Patten, MD  clotrimazole-betamethasone (LOTRISONE) cream Apply 1 application topically 2 (two) times daily. 02/21/14   Timmothy Euler, MD  furosemide (LASIX) 40 MG tablet Take 1 tablet (40 mg total) by mouth daily as needed (swelling in legs or an increased weight by 3-4 pounds up from baseline). 08/03/14   Archie Patten, MD  gabapentin (NEURONTIN) 100 MG capsule Take 1 capsule (100 mg total) by mouth 3 (three) times daily. 08/03/14   Archie Patten, MD  lisinopril (PRINIVIL,ZESTRIL) 20 MG tablet Take 0.5 tablets (10 mg total) by mouth daily. 02/23/14   Hilton Sinclair, MD  mometasone-formoterol (DULERA) 200-5 MCG/ACT AERO Inhale  2 puffs into the lungs 2 (two) times daily. Patient not taking: Reported on 06/18/2014 11/21/13   Coral Spikes, MD  Multiple Vitamins-Minerals (MULTIVITAMIN ADULT PO) Take 1 tablet by mouth daily.    Historical Provider, MD  nortriptyline (PAMELOR) 25 MG capsule Take 1 capsule (25 mg total) by mouth at bedtime. Patient not taking: Reported on 06/18/2014 08/03/13   Zenia Resides, MD  omeprazole (PRILOSEC) 20 MG capsule Take 1 capsule (20 mg total) by mouth daily. 02/21/14   Timmothy Euler, MD  oxyCODONE-acetaminophen (PERCOCET) 5-325 MG per tablet Take 2 tablets by mouth every 6 (six) hours as needed for moderate pain. Patient not taking: Reported on 06/18/2014 06/05/13   Wandra Arthurs, MD  Pyridoxine HCl (VITAMIN B-6 PO) Take 1 tablet by mouth daily.    Historical Provider, MD  Rivaroxaban (XARELTO STARTER PACK) 15 & 20 MG TBPK '15mg'$  tablet by mouth twice a day with food change to 20 mg tab once daily on day 22. Patient not taking: Reported on 06/18/2014 02/22/14   Timmothy Euler, MD  Spacer/Aero-Holding Chambers (BREATHERITE COLL SPACER ADULT) MISC 1 Device by Does not apply route as needed (with inhaler). 07/26/13   Timmothy Euler, MD  tiotropium (SPIRIVA) 18 MCG inhalation capsule Place 1 capsule (18 mcg total) into inhaler and inhale daily. 11/21/13   Coral Spikes, MD  vitamin B-12 (CYANOCOBALAMIN) 100 MCG tablet Take 100 mcg by mouth daily.    Historical Provider, MD  vitamin E 200 UNIT capsule Take 200 Units by mouth daily.    Historical Provider, MD   BP 98/57 mmHg  Pulse 87  Temp(Src) 97.5 F (36.4 C) (Oral)  Resp 20  Ht 6' (1.829 m)  Wt 255 lb (115.667 kg)  BMI 34.58 kg/m2  SpO2 96% Physical Exam  Constitutional: He is oriented to person, place, and time. He appears well-developed and well-nourished.  Non-toxic appearance. No distress.  HENT:  Head: Normocephalic and atraumatic.  Eyes: Conjunctivae, EOM and lids are normal. Pupils are equal, round, and reactive to light.  Neck:  Normal range of motion. Neck supple. No tracheal deviation present. No thyroid mass present.  Cardiovascular: Normal rate, regular rhythm and normal heart sounds.  Exam reveals no gallop.   No murmur heard. Pulmonary/Chest: Effort normal and breath sounds normal. No stridor. No respiratory distress. He has no decreased breath sounds. He has no wheezes. He has no rhonchi. He has no rales.  Abdominal: Soft. Normal appearance and bowel sounds are normal. He exhibits no distension. There is no tenderness. There is no rebound and no CVA tenderness.  Musculoskeletal: Normal range of motion. He exhibits no edema or tenderness.  Neurological: He is  alert and oriented to person, place, and time. No cranial nerve deficit or sensory deficit. GCS eye subscore is 4. GCS verbal subscore is 5. GCS motor subscore is 6.  Left upper extremity strength is 2/5. Left lower extremity strength is 3/5. No facial asymmetry. Speech is normal. Cerebellar functions on the right upper extremity normal. Unable to test on the left  Skin: Skin is warm and dry. No abrasion and no rash noted.  Psychiatric: He has a normal mood and affect. His speech is normal and behavior is normal.  Nursing note and vitals reviewed.   ED Course  Procedures (including critical care time) Labs Review Labs Reviewed  CBC - Abnormal; Notable for the following:    WBC 10.9 (*)    All other components within normal limits  DIFFERENTIAL - Abnormal; Notable for the following:    Monocytes Absolute 1.3 (*)    All other components within normal limits  I-STAT CHEM 8, ED - Abnormal; Notable for the following:    Creatinine, Ser 1.60 (*)    Glucose, Bld 121 (*)    Hemoglobin 18.0 (*)    HCT 53.0 (*)    All other components within normal limits  ETHANOL  PROTIME-INR  APTT  COMPREHENSIVE METABOLIC PANEL  URINE RAPID DRUG SCREEN, HOSP PERFORMED  URINALYSIS, ROUTINE W REFLEX MICROSCOPIC (NOT AT [facility])  I-STAT TROPOININ, ED    Imaging Review No  results found.   EKG Interpretation   Date/Time:  Sunday August 19 2014 18:34:45 EDT Ventricular Rate:  89 PR Interval:  158 QRS Duration: 92 QT Interval:  403 QTC Calculation: 490 R Axis:   51 Text Interpretation:  Sinus rhythm Probable left atrial enlargement  Anteroseptal infarct, old No significant change since last tracing  Confirmed by Keneshia Tena  MD, Laylanie Kruczek ([id number]) on 08/19/2014 8:32:54 PM      MDM   Final diagnoses:  None   Patient seen by neurology as a code stroke. The patient's creatinine was noted he'll be given IV fluids with mild hypotension. Patient's MRI consistent with new stroke. Neurology recommends medicine admission  CRITICAL CARE Performed by: Leota Jacobsen Total critical care time: 45 Critical care time was exclusive of separately billable procedures and treating other patients. Critical care was necessary to treat or prevent imminent or life-threatening deterioration. Critical care was time spent personally by me on the following activities: development of treatment plan with patient and/or surrogate as well as nursing, discussions with consultants, evaluation of patient's response to treatment, examination of patient, obtaining history from patient or surrogate, ordering and performing treatments and interventions, ordering and review of laboratory studies, ordering and review of radiographic studies, pulse oximetry and re-evaluation of patient's condition.     Lacretia Leigh, MD 08/19/14 2034

## 2014-08-20 ENCOUNTER — Inpatient Hospital Stay: Payer: Self-pay | Admitting: Family Medicine

## 2014-08-20 DIAGNOSIS — I638 Other cerebral infarction: Secondary | ICD-10-CM

## 2014-08-20 DIAGNOSIS — I639 Cerebral infarction, unspecified: Secondary | ICD-10-CM | POA: Insufficient documentation

## 2014-08-20 DIAGNOSIS — Z86718 Personal history of other venous thrombosis and embolism: Secondary | ICD-10-CM | POA: Diagnosis present

## 2014-08-20 DIAGNOSIS — I6340 Cerebral infarction due to embolism of unspecified cerebral artery: Principal | ICD-10-CM

## 2014-08-20 DIAGNOSIS — F1020 Alcohol dependence, uncomplicated: Secondary | ICD-10-CM

## 2014-08-20 MED ORDER — CLOPIDOGREL BISULFATE 75 MG PO TABS
75.0000 mg | ORAL_TABLET | Freq: Every day | ORAL | Status: DC
  Administered 2014-08-20: 75 mg via ORAL
  Filled 2014-08-20: qty 1

## 2014-08-20 MED ORDER — GABAPENTIN 100 MG PO CAPS
100.0000 mg | ORAL_CAPSULE | Freq: Three times a day (TID) | ORAL | Status: DC
  Administered 2014-08-20 (×2): 100 mg via ORAL
  Filled 2014-08-20 (×2): qty 1

## 2014-08-20 MED ORDER — VITAMIN B-12 100 MCG PO TABS
100.0000 ug | ORAL_TABLET | Freq: Every day | ORAL | Status: DC
  Administered 2014-08-20: 100 ug via ORAL
  Filled 2014-08-20: qty 1

## 2014-08-20 MED ORDER — CLOPIDOGREL BISULFATE 75 MG PO TABS: 75.0000 mg | ORAL_TABLET | Freq: Every day | ORAL | Status: DC

## 2014-08-20 MED ORDER — PANTOPRAZOLE SODIUM 40 MG PO TBEC
40.0000 mg | DELAYED_RELEASE_TABLET | Freq: Every day | ORAL | Status: DC
  Administered 2014-08-20: 40 mg via ORAL
  Filled 2014-08-20: qty 1

## 2014-08-20 MED ORDER — RIVAROXABAN 20 MG PO TABS: 20.0000 mg | ORAL_TABLET | Freq: Every day | ORAL | Status: DC

## 2014-08-20 MED ORDER — ATORVASTATIN CALCIUM 40 MG PO TABS
40.0000 mg | ORAL_TABLET | Freq: Every day | ORAL | Status: DC
  Administered 2014-08-20: 40 mg via ORAL
  Filled 2014-08-20: qty 1

## 2014-08-20 MED ORDER — ASPIRIN EC 325 MG PO TBEC
325.0000 mg | DELAYED_RELEASE_TABLET | Freq: Every day | ORAL | Status: DC
  Administered 2014-08-20: 325 mg via ORAL
  Filled 2014-08-20: qty 1

## 2014-08-20 MED ORDER — FUROSEMIDE 40 MG PO TABS: 40.0000 mg | ORAL_TABLET | Freq: Every day | ORAL | Status: DC | PRN

## 2014-08-20 MED ORDER — PYRIDOXINE HCL 25 MG PO TABS
25.0000 mg | ORAL_TABLET | Freq: Every day | ORAL | Status: DC
  Administered 2014-08-20: 25 mg via ORAL
  Filled 2014-08-20: qty 1

## 2014-08-20 MED ORDER — ASPIRIN 325 MG PO TBEC: 325.0000 mg | DELAYED_RELEASE_TABLET | Freq: Every day | ORAL | Status: DC

## 2014-08-20 NOTE — Discharge Summary (Signed)
Physician Discharge Summary  Patient ID: [name] MRN: [id number] DOB/AGE: [birth year] 65 y.o.  Admit date: 08/19/2014 Discharge date: 08/20/2014  Admission Diagnoses: CVA  Discharge Diagnoses: CVA Principal Problem:   CVA (cerebral infarction) Active Problems:   Diastolic CHF, acute on chronic   AKI (acute kidney injury)   History of DVT (deep vein thrombosis)   Alcoholism   Embolic stroke   Discharged Condition: improved.  Hospital Course: KAYMON DENOMME is a 64 y.o. male with PMH significant for recent hospitalizations for CVA, HTN, CAD, PAD, HFpEF, tobacco use, h/o DVT/PE last in 02/2014, who presented with left arm weakness and numbness suggestive for re-stroke. Patient was discharged on Xarelto during previous admission for stroke but didn't pic up. Patient was evaluated by on arrival. Neuro exam significant for 4/5 strength at left shoulder, elbow and wrist, decreased sensation in left arm to shoulder level over all dermatome on arrival. Neurologic exams elsewhere were unremarkable. His UDS wsa positive for benzo and cocaine. His  Head CT was negative for acute intracranial pathology. However, MRI brain read newly seen foci of acute infarction in the right posterior frontal cortex near the vertex w/o mass effect or hemorrhage. Patient was not a candidate for tPA given his recent stroke. As a result he was started on ASA 325 mg + Plavix 75 mg daily per neuro recommendation. His Atorvastatin was also increased to 80 mg daily.  On HD-2: patient reported significant improvement of weakness and numbness in his arm. He was examined by care team and evaluated by PT/OT and deemed fit to return home.   AKI: Cr 1.69 on admission. B/l seems to be ~0.9. Likely pre-renal given his low BP on arrival. 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 After 500 ml NS bolus and 175m/hr for 12 hrs his Cr trended down to 1.6. He may need BMP as outpatient on follow up.  HTN: 98/57 on  arrival. On lisinopril 20 mg at home. We held his home lisinopril as hypotension could be the insult for his CVA and AKI.  His other medical conditions were stable.   Consults: neurology  Significant Diagnostic Studies: CXR, CT head and MRI of brain  Discharge Exam: Blood pressure 129/68, pulse 86, temperature 99 F (37.2 C), temperature source Oral, resp. rate 16, height 6' (1.829 m), weight 238 lb 1.6 oz (108 kg), SpO2 91 %.  Physical Exam: See progress note on the date of discharge.  Disposition: 01-Home or Self Care      Discharge Instructions    Diet - low sodium heart healthy    Complete by:  As directed      Increase activity slowly    Complete by:  As directed             Medication List    STOP taking these medications        furosemide 40 MG tablet  Commonly known as:  LASIX     lisinopril 20 MG tablet  Commonly known as:  PRINIVIL,ZESTRIL     Rivaroxaban 15 & 20 MG Tbpk  Commonly known as:  XARELTO STARTER PACK      TAKE these medications        albuterol (2.5 MG/3ML) 0.083% nebulizer solution  Commonly known as:  PROVENTIL  Take 2.5 mg by nebulization 2 (two) times daily as needed for wheezing or shortness of breath.     albuterol 108 (90 BASE) MCG/ACT inhaler  Commonly known as:  PROVENTIL HFA;VENTOLIN HFA  Inhale  2 puffs into the lungs every 6 (six) hours as needed for wheezing or shortness of breath.     BREATHERITE COLL SPACER ADULT Misc  1 Device by Does not apply route as needed (with inhaler).     clotrimazole-betamethasone cream  Commonly known as:  LOTRISONE  Apply 1 application topically 2 (two) times daily.     FOLIC ACID PO  Take 1 tablet by mouth daily.     gabapentin 100 MG capsule  Commonly known as:  NEURONTIN  Take 1 capsule (100 mg total) by mouth 3 (three) times daily.     mometasone-formoterol 200-5 MCG/ACT Aero  Commonly known as:  DULERA  Inhale 2 puffs into the lungs 2 (two) times daily.     nortriptyline 25 MG  capsule  Commonly known as:  PAMELOR  Take 1 capsule (25 mg total) by mouth at bedtime.     omeprazole 20 MG capsule  Commonly known as:  PRILOSEC  Take 1 capsule (20 mg total) by mouth daily.     oxyCODONE-acetaminophen 5-325 MG per tablet  Commonly known as:  PERCOCET  Take 2 tablets by mouth every 6 (six) hours as needed for moderate pain.     tiotropium 18 MCG inhalation capsule  Commonly known as:  SPIRIVA  Place 1 capsule (18 mcg total) into inhaler and inhale daily.     vitamin B-12 100 MCG tablet  Commonly known as:  CYANOCOBALAMIN  Take 100 mcg by mouth daily.     VITAMIN B-6 PO  Take 1 tablet by mouth daily.     vitamin E 200 UNIT capsule  Take 200 Units by mouth daily.      ASK your doctor about these medications        atorvastatin 40 MG tablet  Commonly known as:  LIPITOR  Take 1 tablet (40 mg total) by mouth daily at 6 PM.       Follow-up Information    Follow up with Nobie Putnam, DO In 1 week.   Specialty:  Osteopathic Medicine   Why:  Hospital follow up   Contact information:   [place]  [address] [phone]      Things to follow up on:  We held his lisinopril as we thought hypotension could led to his CVA. May consider restarting at follow  up visit as needed  His Cr has gone up to 1.69>>1.6 (b/l appears to be less than 1. Check BMP  Cigarette smoking: 1-pack a day  Stress about quitting smoking   Cocaine use: UDS +ve for cocaine and benzo  Stress on this especially with his significant cardiac hx  Medication compliance: this appear to be another issue for him  Stress on this as well.   Signed: Mercy Riding 08/20/2014, 2:55 PM

## 2014-08-20 NOTE — Progress Notes (Signed)
Occupational Therapy Evaluation Patient Details Name: [name] MRN: [id number] DOB: Nov 23, 1950 Today's Date: 08/20/2014    History of Present Illness 64 yo M with 1 day history of Left arm weakness and numbness. MRI revealed acute infarct R posterior frontal cortex. Patient had prior R frontal lobe infarct, watershed between ACA/MCA Rangel few weeks ago.   Clinical Impression   Patient presents to OT with decreased ADL independence and safety due to the deficits listed below. He will benefit from skilled OT to maximize independence. OT will follow.    Follow Up Recommendations  Supervision/Assistance - Intermittent;Outpatient OT    Equipment Recommendations  None recommended by OT    Recommendations for Other Services PT consult     Precautions / Restrictions Precautions Precautions: Fall Restrictions Weight Bearing Restrictions: No      Mobility Bed Mobility Overal bed mobility: Needs Assistance Bed Mobility: Supine to Sit;Sit to Supine     Supine to sit: Supervision Sit to supine: Supervision      Transfers Overall transfer level: Needs assistance Equipment used: 1 person hand held assist Transfers: Sit to/from Stand;Stand Pivot Transfers Sit to Stand: Supervision Stand pivot transfers: Min guard            Balance                                            ADL Overall ADL's : Needs assistance/impaired Eating/Feeding: Set up;Bed level   Grooming: Wash/dry face;Oral care;Minimal assistance;Sitting   Upper Body Bathing: Sitting;Minimal assitance   Lower Body Bathing: Minimal assistance;Sit to/from stand   Upper Body Dressing : Minimal assistance;Sitting   Lower Body Dressing: Minimal assistance;Moderate assistance;Sit to/from stand   Toilet Transfer: Supervision/safety;Stand-pivot   Toileting- Water quality scientist and Hygiene: Minimal assistance;Sit to/from stand       Functional mobility during ADLs:  Supervision/safety;Cane General ADL Comments: Patient having difficulty with ADLs due to L UE weakness, decreased sensation, decreased fine motor coordination. Difficulty grasping objects. Able to place toothbrush in LUE and use as gross assist while he opened toothpaste and put toothpaste on toothbrush. Patient reports his aide can assist with ADLs if needed and has been providing assistance with tub transfer at home. Educated patient on using LUE as gross assist during ADLs as much as possible, and on my recommendation for follow-up OT to improve LUE function. Patient verbalized understanding.     Vision Vision Assessment?: No apparent visual deficits   Perception     Praxis      Pertinent Vitals/Pain Pain Assessment: No/denies pain     Hand Dominance Right   Extremity/Trunk Assessment Upper Extremity Assessment Upper Extremity Assessment: LUE deficits/detail LUE Deficits / Details: Has movement at all joints but very weak. Unable to take any resistance. Distal weakness greater than proximal weakness. PROM intact. Patient has difficulty grasping objects, decreased fine motor coordination, and decreased sensation in digits. LUE Sensation: decreased light touch (only in digits) LUE Coordination: decreased fine motor   Lower Extremity Assessment Lower Extremity Assessment: Overall WFL for tasks assessed   Cervical / Trunk Assessment Cervical / Trunk Assessment: Normal   Communication Communication Communication: HOH   Cognition Arousal/Alertness: Awake/alert Behavior During Therapy: WFL for tasks assessed/performed Overall Cognitive Status: Within Functional Limits for tasks assessed                     General Comments  Exercises       Shoulder Instructions      Home Living Family/patient expects to be discharged to:: Private residence Living Arrangements: Alone Available Help at Discharge: Personal care attendant (7 days/week x 2 hours/day for  housework) Type of Home: Apartment Home Access: Elevator     Home Layout: One level     Bathroom Shower/Tub: Tub/shower unit;Curtain Shower/tub characteristics: Architectural technologist: Handicapped height     Home Equipment: [place] - single point;Grab bars - tub/shower          Prior Functioning/Environment Level of Independence: Needs assistance  Gait / Transfers Assistance Needed: Uses SPC at all times when walking.  ADL's / Homemaking Assistance Needed: Aide assists with housework, meal prep and transportation        OT Diagnosis: Generalized weakness   OT Problem List: Decreased strength;Decreased range of motion;Impaired UE functional use   OT Treatment/Interventions: Self-care/ADL training;Therapeutic exercise;Neuromuscular education;DME and/or AE instruction;Therapeutic activities;Patient/family education    OT Goals(Current goals can be found in the care plan section) Acute Rehab OT Goals Patient Stated Goal: to go home OT Goal Formulation: With patient Time For Goal Achievement: 09/03/14 Potential to Achieve Goals: Good ADL Goals Pt Will Perform Grooming: with modified independence Pt Will Perform Upper Body Bathing: with modified independence Pt Will Perform Lower Body Bathing: with modified independence Pt Will Perform Upper Body Dressing: with modified independence Pt Will Perform Lower Body Dressing: with modified independence Pt Will Transfer to Toilet: with modified independence Pt Will Perform Toileting - Clothing Manipulation and hygiene: with modified independence Pt Will Perform Tub/Shower Transfer: with modified independence Additional ADL Goal #1: Patient will be independent with LUE exercises for neuro re-education.  OT Frequency: Min 2X/week   Barriers to D/C: Decreased caregiver support          Co-evaluation              End of Session    Activity Tolerance: Patient tolerated treatment well Patient left: in bed;with call  bell/phone within reach;with bed alarm set   Time: 6160-7371 OT Time Calculation (min): 26 min Charges:  OT General Charges $OT Visit: 1 Procedure OT Evaluation $Initial OT Evaluation Tier I: 1 Procedure OT Treatments $Self Care/Home Management : 8-22 mins G-Codes:    [name] September 13, 2014, 10:14 AM

## 2014-08-20 NOTE — Discharge Instructions (Addendum)
It is nice taking care of you! You were admitted with stroke. It was shown on your head imaging that some of your blood vessels carrying blood to your brain has narrowed. This caused decreased blood flow to your brain that cause injury to a part of your brain. This is manifested as weakness and numbness as in your case.   We have started you on medication that reduces your risk of having another stroke. You should diligently take all your medication. The medications are listed on your discharge paper togther with directions on how to take them. There are some of the medication that we discontinued as well. Please read the directions carefully before you start taking them. Along with that, there are thing you can do to reduce your risk such as quitting smoking and stopping drug use.   We also recommend and follow up with your primary care doctor in about a week to have an evaluation and review your medication.  Below are some information about stroke that you can read!  Thank you!   Ischemic Stroke A stroke (cerebrovascular accident) is the sudden death of brain tissue. It is a medical emergency. A stroke can cause permanent loss of brain function. This can cause problems with different parts of your body. A transient ischemic attack (TIA) is different because it does not cause permanent damage. A TIA is a short-lived problem of poor blood flow affecting a part of the brain. A TIA is also a serious problem because having a TIA greatly increases the chances of having a stroke. When symptoms first develop, you cannot know if the problem might be a stroke or a TIA. CAUSES  A stroke is caused by a decrease of oxygen supply to an area of your brain. It is usually the result of a small blood clot or collection of cholesterol or fat (plaque) that blocks blood flow in the brain. A stroke can also be caused by blocked or damaged carotid arteries.  RISK FACTORS  High blood pressure (hypertension).  High  cholesterol.  Diabetes mellitus.  Heart disease.  The buildup of plaque in the blood vessels (peripheral artery disease or atherosclerosis).  The buildup of plaque in the blood vessels providing blood and oxygen to the brain (carotid artery stenosis).  An abnormal heart rhythm (atrial fibrillation).  Obesity.  Smoking.  Taking oral contraceptives (especially in combination with smoking).  Physical inactivity.  A diet high in fats, salt (sodium), and calories.  Alcohol use.  Use of illegal drugs (especially cocaine and methamphetamine).  Being African American.  Being over the age of 82.  Family history of stroke.  Previous history of blood clots, stroke, TIA, or heart attack.  Sickle cell disease. SYMPTOMS  These symptoms usually develop suddenly, or may be newly present upon awakening from sleep:  Sudden weakness or numbness of the face, arm, or leg, especially on one side of the body.  Sudden trouble walking or difficulty moving arms or legs.  Sudden confusion.  Sudden personality changes.  Trouble speaking (aphasia) or understanding.  Difficulty swallowing.  Sudden trouble seeing in one or both eyes.  Double vision.  Dizziness.  Loss of balance or coordination.  Sudden severe headache with no known cause.  Trouble reading or writing. DIAGNOSIS  Your health care provider can often determine the presence or absence of a stroke based on your symptoms, history, and physical exam. Computed tomography (CT) of the brain is usually performed to confirm the stroke, determine causes, and determine  stroke severity. Other tests may be done to find the cause of the stroke. These tests may include:  Electrocardiography.  Continuous heart monitoring.  Echocardiography.  Carotid ultrasonography.  Magnetic resonance imaging (MRI).  A scan of the brain circulation.  Blood tests. PREVENTION  The risk of a stroke can be decreased by appropriately treating  high blood pressure, high cholesterol, diabetes, heart disease, and obesity and by quitting smoking, limiting alcohol, and staying physically active. TREATMENT  Time is of the essence. It is important to seek treatment at the first sign of these symptoms because you may receive a medicine to dissolve the clot (thrombolytic) that cannot be given if too much time has passed since your symptoms began. Even if you do not know when your symptoms began, get treatment as soon as possible as there are other treatment options available including oxygen, intravenous (IV) fluids, and medicines to thin the blood (anticoagulants). Treatment of stroke depends on the duration, severity, and cause of your symptoms. Medicines and dietary changes may be used to address diabetes, high blood pressure, and other risk factors. Physical, speech, and occupational therapists will assess you and work with you to improve any functions impaired by the stroke. Measures will be taken to prevent short-term and long-term complications, including infection from breathing foreign material into the lungs (aspiration pneumonia), blood clots in the legs, bedsores, and falls. Rarely, surgery may be needed to remove large blood clots or to open up blocked arteries. HOME CARE INSTRUCTIONS   Take medicines only as directed by your health care provider. Follow the directions carefully. Medicines may be used to control risk factors for a stroke. Be sure you understand all your medicine instructions.  You may be told to take a medicine to thin the blood, such as aspirin or the anticoagulant warfarin. Warfarin needs to be taken exactly as instructed.  Too much and too little warfarin are both dangerous. Too much warfarin increases the risk of bleeding. Too little warfarin continues to allow the risk for blood clots. While taking warfarin, you will need to have regular blood tests to measure your blood clotting time. These blood tests usually include  both the PT and INR tests. The PT and INR results allow your health care provider to adjust your dose of warfarin. The dose can change for many reasons. It is critically important that you take warfarin exactly as prescribed, and that you have your PT and INR levels drawn exactly as directed.  Many foods, especially foods high in vitamin K, can interfere with warfarin and affect the PT and INR results. Foods high in vitamin K include spinach, kale, broccoli, cabbage, collard and turnip greens, brussels sprouts, peas, cauliflower, seaweed, and parsley, as well as beef and pork liver, green tea, and soybean oil. You should eat a consistent amount of foods high in vitamin K. Avoid major changes in your diet, or notify your health care provider before changing your diet. Arrange a visit with a dietitian to answer your questions.  Many medicines can interfere with warfarin and affect the PT and INR results. You must tell your health care provider about any and all medicines you take. This includes all vitamins and supplements. Be especially cautious with aspirin and anti-inflammatory medicines. Do not take or discontinue any prescribed or over-the-counter medicine except on the advice of your health care provider or pharmacist.  Warfarin can have side effects, such as excessive bruising or bleeding. You will need to hold pressure over cuts for longer  than usual. Your health care provider or pharmacist will discuss other potential side effects.  Avoid sports or activities that may cause injury or bleeding.  Be mindful when shaving, flossing your teeth, or handling sharp objects.  Alcohol can change the body's ability to handle warfarin. It is best to avoid alcoholic drinks or consume only very small amounts while taking warfarin. Notify your health care provider if you change your alcohol intake.  Notify your dentist or other health care providers before procedures.  If swallow studies have determined that  your swallowing reflex is present, you should eat healthy foods. Including 5 or more servings of fruits and vegetables a day may reduce the risk of stroke. Foods may need to be a certain consistency (soft or pureed), or small bites may need to be taken in order to avoid aspirating or choking. Certain dietary changes may be advised to address high blood pressure, high cholesterol, diabetes, or obesity.  Food choices that are low in sodium, saturated fat, trans fat, and cholesterol are recommended to manage high blood pressure.  Food choies that are high in fiber, and low in saturated fat, trans fat, and cholesterol may control cholesterol levels.  Controlling carbohydrates and sugar intake is recommended to manage diabetes.  Reducing calorie intake and making food choices that are low in sodium, saturated fat, trans fat, and cholesterol are recommended to manage obesity.  Maintain a healthy weight.  Stay physically active. It is recommended that you get at least 30 minutes of activity on all or most days.  Do not use any tobacco products including cigarettes, chewing tobacco, or electronic cigarettes.  Limit alcohol use even if you are not taking warfarin. Moderate alcohol use is considered to be:  No more than 2 drinks each day for men.  No more than 1 drink each day for nonpregnant women.  Home safety. A safe home environment is important to reduce the risk of falls. Your health care provider may arrange for specialists to evaluate your home. Having grab bars in the bedroom and bathroom is often important. Your health care provider may arrange for equipment to be used at home, such as raised toilets and a seat for the shower.  Physical, occupational, and speech therapy. Ongoing therapy may be needed to maximize your recovery after a stroke. If you have been advised to use a walker or a cane, use it at all times. Be sure to keep your therapy appointments.  Follow all instructions for  follow-up with your health care provider. This is very important. This includes any referrals, physical therapy, rehabilitation, and lab tests. Proper follow-up can prevent another stroke from occurring. SEEK MEDICAL CARE IF:  You have personality changes.  You have difficulty swallowing.  You are seeing double.  You have dizziness.  You have a fever.  You have skin breakdown. SEEK IMMEDIATE MEDICAL CARE IF:  Any of these symptoms may represent a serious problem that is an emergency. Do not wait to see if the symptoms will go away. Get medical help right away. Call your local emergency services (911 in [place] Do not drive yourself to the hospital.  You have sudden weakness or numbness of the face, arm, or leg, especially on one side of the body.  You have sudden trouble walking or difficulty moving arms or legs.  You have sudden confusion.  You have trouble speaking (aphasia) or understanding.  You have sudden trouble seeing in one or both eyes.  You have a loss of  balance or coordination.  You have a sudden, severe headache with no known cause.  You have new chest pain or an irregular heartbeat.  You have a partial or total loss of consciousness. Document Released: 01/26/2005 Document Revised: 06/12/2013 Document Reviewed: 09/06/2011 [facility] Patient Information 2015 [place], [place]. This information is not intended to replace advice given to you by your health care provider. Make sure you discuss any questions you have with your health care provider.

## 2014-08-20 NOTE — Progress Notes (Signed)
Family Medicine Teaching Service Daily Progress Note Intern Pager: [phone]  Patient name: [name] Medical record number: [id number] Date of birth: May 25, 1950 Age: 64 y.o. Gender: male  Primary Care Provider: Nobie Putnam, DO Consultants: neurology Code Status: DNR/DNI  Pt Overview and Major Events to Date:  7/10: admitted with stroke  Assessment and Plan: Left arm weakness/numbness likely stroke/TIA: improved. sudden onset. Similar presentation on recent hospitalization, pt was told to restart xarelto on discharge at the end of June but never picked up the prescription. Likely ischemic vs. hemorrhagic given his soft BP on arrival to ED. Neuro exam significant for 4/5 strength (at shoulder, elbow and grip), decreased sensation on the left. No acute intracranial pathology on CT head but newly seen foci of acute infarction in the right posterior frontal cortex near the vertex w/o mass effect or hemorrhage on MRI brian. Multiple risk factors including compliance issue with his Xarelto. Last LDL 75, HDL 30 in 07/2014. On atorvastatin 40. He may need high intensity (80 mg) given his strong risk. His imaging from recent hospitalization correlates with his presentation. Given recent hx of stroke, he is not a candidate for tPA. Neuro consulted and saw patient in ED. - Admit to step-down - Appreciate neuro recs: - ASA 325 mg + Plavix 75 mg daily - hold home lisinopril - Atorvastatin 80 mg daily - Consider reimaging if symptoms worsen  AKI: Cr 1.69 on admission. B/l seems to be ~0.9. Likely pre-renal given his low BP on arrival. 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 - NS 179m/hr - BMP in AM   HTN: 98/57 on arrival. On lisinopril 20 mg at home - hold home lisinopril as hypotension could be the insult for his CVA  PAD: DP not palpable, PT pulses palpable, 2+ edema in LE. LE arterial duplex in 2014 with severe proximal disease in right leg  and mild to moderate range for proximal disease in the left..On lasix 40 mg & gabapentin 100 mg. - hold home lasix - continue home gabapentin - consider arterial duplex   CAD/MI/CHF: hx of MI in 2000 per EMR. Also single vessel cath in 2012. No chest pain or dyspnea this time. Mild cardiac enlargement on CXR. LDL & HDL 75 & 30 resp in 07/2014. Recent 2D echo on 06/23 with EF of 50-55% with grade 1DD. A1c 6.4 on 6/23. -hold home lisinopril -atorvastatin as above -ASA as above  Hx of DVT/PE: his PVL in 02/2014 significant for RLE DVT involving the popliteal vein. And superficial thrombosis of calf veins. On Xarelto in the past but poor compliance. - will continue antiplatelets for now - discuss restarting xarelto  COPD: on albuterol neb and Spiriva at home. Not on oxygen. No SOB or respiratory symptoms this time. - continue home spiriva - Duonebs q2hrs prn - O2 for SpO < 88%  OSA: not clear if he uses CPAP at home.  Smoking: a pack a day for 30 yrs.  - Encourage quitting - nicotine patch if requested  Drug use: UDS positive for cocaine and benzo. He denied drug use on admission but admitted when I talked to him this morning. - Discourage drug use  FEN/GI:  F: NS 125 ml/hr E: replete as needed N: NPO pending SLP eval  GI: protonix  Prophylaxis:   Disposition: step-down  Subjective:  Reports the numbness & weakness in his arm has gotten better except in his left hand. Denies any other neurologic symptoms, headache, chest pain and SOB. Says he is  ready to go home.  Objective: Temp:  [97.5 F (36.4 C)-98.4 F (36.9 C)] 98.4 F (36.9 C) (07/11 0900) Pulse Rate:  [75-87] 80 (07/11 0900) Resp:  [16-21] 18 (07/11 0900) BP: (98-153)/(57-98) 149/98 mmHg (07/11 0900) SpO2:  [94 %-100 %] 94 % (07/11 0900) Weight:  [238 lb 1.6 oz (108 kg)-255 lb (115.667 kg)] 238 lb 1.6 oz (108 kg) (07/10 2312) Physical Exam: Gen: NAD. AAOx3, cooperative with exam HEENT: Atraumatic, EOMI,  PERRLA, Oropharynx clear. MMM CV: RRR. No murmurs. 2+ radial. DP pulses hard to palpate bilaterally. Resp: CTAB. No wheezing, crackles, or rhonchi noted. Abd: +BS. Soft, non-distended, non-tender. No rebound or guarding.  Ext: 2+ pitting edema. Neuro: AAOx3, CN II-XII intact, 4/5 strength (shoulder, elbow and grip), 5/5 in wrist, improved sensation in his left arm except his hand. Rt arm and LE exams WNL.   Laboratory:  Recent Labs Lab 08/19/14 1823 08/19/14 1830  WBC 10.9*  --   HGB 16.9 18.0*  HCT 50.4 53.0*  PLT 166  --     Recent Labs Lab 08/19/14 1823 08/19/14 1830  NA 141 141  K 4.0 3.9  CL 105 105  CO2 23  --   BUN 11 14  CREATININE 1.69* 1.60*  CALCIUM 10.3  --   PROT 7.3  --   BILITOT 0.8  --   ALKPHOS 70  --   ALT 33  --   AST 33  --   GLUCOSE 117* 121*   Imaging/Diagnostic Tests: Ct Head Wo Contrast  08/19/2014   IMPRESSION: No acute intracranial pathology.  Focal ill-defined hypodensity in the right occipital lobe, possibly artifactual. Acute ischemia is not excluded. MRI may provide better evaluation.  Moderate age-related atrophy and chronic microvascular ischemic disease.  Critical Value/emergent results were called by telephone at the time of interpretation on 08/19/2014 at 6:41 pm to Dr. Lacretia Leigh , who verbally acknowledged these results.   Electronically Signed   By: Anner Crete M.D.   On: 08/19/2014 18:44   Mr Brain Wo Contrast  08/19/2014   IMPRESSION: Newly seen foci of acute infarction in the right posterior frontal cortex near the vertex. This could represent extension of watershed infarction or could be new embolic infarctions. No mass effect or hemorrhage in this location.  Extensive chronic small vessel ischemic changes elsewhere throughout the brain.  Increased FLAIR signal in the sulci posteriorly could relate to increased protein content of the CSF.   Electronically Signed   By: Nelson Chimes M.D.   On: 08/19/2014 20:07   Dg Chest  Portable 1 View  08/19/2014 IMPRESSION: Mild cardiac enlargement. Linear atelectasis in the left lung base. No evidence of active pulmonary disease.   Electronically Signed   By: Lucienne Capers M.D.   On: 08/19/2014 21:30    Mercy Riding, MD 08/20/2014, 9:32 AM PGY-1, [place] Intern pager: [phone], text pages welcome

## 2014-08-20 NOTE — Progress Notes (Signed)
STROKE TEAM PROGRESS NOTE   HISTORY [name] is a 64 y.o. male with a history of recent hospitaliziation due to stroke, embolic vs watershed form r supraclinoid ICA stenosis. He was on his lawnmower outside and had sudden worsening of his left sided weakness at 1430 08/19/2014 ([facility]) prompting him to come in as a code stroke. Due to recent stroke, not a tpa candidate and an MRI was obtained which shows new areas of infarct.   Of note, he states that he is not taking xarelto despite his DC summary indicating he was told to. He states that this is due to passing blood prior to his last admission, no recent GI bleeding since then. He has not been taking aspirin either.    SUBJECTIVE (INTERVAL HISTORY) No family is at the bedside.  Overall he feels his condition is unchanged.    OBJECTIVE Temp:  [97.5 F (36.4 C)-98.4 F (36.9 C)] 98.4 F (36.9 C) (07/11 0900) Pulse Rate:  [75-87] 80 (07/11 0900) Cardiac Rhythm:  [-] Normal sinus rhythm (07/11 0756) Resp:  [16-21] 18 (07/11 0900) BP: (98-153)/(57-98) 149/98 mmHg (07/11 0900) SpO2:  [94 %-100 %] 94 % (07/11 0900) Weight:  [108 kg (238 lb 1.6 oz)-115.667 kg (255 lb)] 108 kg (238 lb 1.6 oz) (07/10 2312)  No results for input(s): GLUCAP in the last 168 hours.  Recent Labs Lab 08/19/14 1823 08/19/14 1830  NA 141 141  K 4.0 3.9  CL 105 105  CO2 23  --   GLUCOSE 117* 121*  BUN 11 14  CREATININE 1.69* 1.60*  CALCIUM 10.3  --     Recent Labs Lab 08/19/14 1823  AST 33  ALT 33  ALKPHOS 70  BILITOT 0.8  PROT 7.3  ALBUMIN 4.1    Recent Labs Lab 08/19/14 1823 08/19/14 1830  WBC 10.9*  --   NEUTROABS 6.9  --   HGB 16.9 18.0*  HCT 50.4 53.0*  MCV 92.5  --   PLT 166  --    No results for input(s): CKTOTAL, CKMB, CKMBINDEX, TROPONINI in the last 168 hours.  Recent Labs  08/19/14 1823  LABPROT 14.3  INR 1.09    Recent Labs  08/19/14 2103  COLORURINE AMBER*  LABSPEC 1.020  PHURINE 5.0  GLUCOSEU NEGATIVE   HGBUR NEGATIVE  BILIRUBINUR SMALL*  KETONESUR NEGATIVE  PROTEINUR 30*  UROBILINOGEN 0.2  NITRITE NEGATIVE  LEUKOCYTESUR NEGATIVE       Component Value Date/Time   CHOL 130 08/02/2014 0445   TRIG 125 08/02/2014 0445   HDL 30* 08/02/2014 0445   CHOLHDL 4.3 08/02/2014 0445   VLDL 25 08/02/2014 0445   LDLCALC 75 08/02/2014 0445   Lab Results  Component Value Date   HGBA1C 6.4* 08/02/2014      Component Value Date/Time   LABOPIA NONE DETECTED 08/19/2014 2102   COCAINSCRNUR POSITIVE* 08/19/2014 2102   LABBENZ POSITIVE* 08/19/2014 2102   AMPHETMU NONE DETECTED 08/19/2014 2102   THCU NONE DETECTED 08/19/2014 2102   LABBARB NONE DETECTED 08/19/2014 2102     Recent Labs Lab 08/19/14 1823  ETH <5    Ct Head Wo Contrast 08/19/2014    No acute intracranial pathology.  Focal ill-defined hypodensity in the right occipital lobe, possibly artifactual. Acute ischemia is not excluded. MRI may provide better evaluation.  Moderate age-related atrophy and chronic microvascular ischemic disease.   Mr Brain Wo Contrast 08/19/2014    Newly seen foci of acute infarction in the right posterior frontal cortex near  the vertex. This could represent extension of watershed infarction or could be new embolic infarctions. No mass effect or hemorrhage in this location.  Extensive chronic small vessel ischemic changes elsewhere throughout the brain.  Increased FLAIR signal in the sulci posteriorly could relate to increased protein content of the CSF.     Dg Chest Portable 1 View 08/19/2014    Mild cardiac enlargement. Linear atelectasis in the left lung base. No evidence of active pulmonary disease.       PHYSICAL EXAM Obese unkempt Caucasian male. Not in distress. . Afebrile. Head is nontraumatic. Neck is supple without bruit.    Cardiac exam no murmur or gallop. Lungs are clear to auscultation. Distal pulses are well felt. Neurological Exam :  Awake alert oriented 3 normal speech and language. Fundi  were not visualized. Vision acuity seems adequate. Extraocular movements are full range without nystagmus. Mild left lower facial asymmetry. Tongue midline. Motor system exam no upper or lower extremity drift but weakness of left grip and intrinsic hand muscles. Orbits right over left upper extremity. Mild weakness of left hip flexors and ankle dorsiflexors. Sensation is preserved bilaterally. Coordination is slightly slow on the left compared to the right. Gait was not tested. ASSESSMENT/PLAN Mr. KAINE MCQUILLEN is a 65 y.o. male with history of recent stroke 07/31/2014 (R frontal infarcts, felt to be watershed in setting of hypotension though could not rule out embolic source) as well as hypertension, hyperlipidemia, coronary artery disease with previous MI, COPD, congestive heart failure, alcohol abuse, tobacco abuse, history of DVT, poor medical compliance with anticoagulation presenting with left arm weakness while moving the yard. He did not receive IV t-PA due to recent stroke within 90 days.   Stroke:  New Non-dominant right posterior frontal infarct secondary to hypotension in setting of  severe R paraclinoid stenosis  Resultant  Left sided weakness  MRI  New R posterior frontal infarct  Carotid Doppler  08/02/2014 Bilateral:1-39% ICA stenosis. Vertebral artery flow is antegrade. Left:Proximal portion of the ECA appears occluded  2D Echo  08/02/2014 EF 50-55%, no source of embolus  LDL 75  HgbA1c 6.4  None ordered - will add SCDs  for VTE prophylaxis Diet Heart Room service appropriate?: Yes; Fluid consistency:: Thin  not taking xarelto at home as prescribed at time of last discharge, per pt, did not recieve Rx, prior to admission, now on aspirin 325 mg orally every day and clopidogrel 75 mg orally every day. Dr. Leonie Man spoke with resident. Team considering resuming xarelto, based on DVT (?PE) treatment course. If xarelto not resumed, given large vessel intracranial atherosclerosis,  patient should be treated with aspirin 81 mg and clopidogrel 75 mg orally every day x 3 months for secondary stroke prevention. After 3 months, change to plavix alone. Long-term dual antiplatelets are contraindicated due to risk for intracerebral hemorrhage.   Patient counseled to be compliant with his antithrombotic medications  Ongoing aggressive stroke risk factor management  Therapy recommendations:  OP OT  Disposition:  Pending. Concern about ongoing noncompiance and readmission risk. Recommend detailed home assessment and review of discharge disposition that would be safest for pt.  Hypotension  Hx hypertension  BP 98/57 on arrival  Home meds:   Lisinopril on hole  Stable  Hyperlipidemia  Home meds:  liptor 80,  resumed in hospital  LDL 75, goal < 70  Continue statin at discharge  Other Stroke Risk Factors  Advanced age  Cigarette smoker, advised to stop smoking  Cocaine and benzo  use - UDS positive this admission   ETOH use  Obesity, Body mass index is 32.28 kg/(m^2).   Hx stroke/TIA  Coronary artery disease - MI  Obstructive sleep apnea, on CPAP at home  Other Active Problems  AKI - felt to be prerenal d/t low BP on arrival and dehydration from being out in the head  Hx DVT, PVL in 02/2014 significant for RLE DVT involving the popliteal vein, treated with xarelto, noncompliant - had stopped d/t rectal bleeding prior to last admission, never resumed, though instructed. Refused colonoscopy in hospital.  PAD  CHF  OSA  Hospital day # [place] for Pager information 08/20/2014 11:39 AM  I have personally examined this patient, reviewed notes, independently viewed imaging studies, participated in medical decision making and plan of care. I have made any additions or clarifications directly to the above note. Agree with note above. He has presented with recurrent right brain infarct in the setting of medication  noncompliance and high-grade intracranial stenosis. He remains at risk for neurological worsening, recurrent stroke, TIA needs ongoing stroke evaluation and aggressive risk factor modification. I counseled the patient and the need to be compliant with his medications and risk factor control and answered questions. Discussed with family practice teaching service medical resident to look into the need for ongoing Xarelto for his DVT and is no longer necessary may consider aspirin and Plavix. If Xarelto is indeed necessary than aspirin and Xarelto  Antony Contras, MD Medical Director [facility] Pager: [phone] 08/20/2014 1:58 PM    To contact Stroke Continuity provider, please refer to [email or web address] After hours, contact General Neurology

## 2014-08-20 NOTE — Evaluation (Signed)
Physical Therapy Evaluation Patient Details Name: [name] MRN: [id number] DOB: 01/15/1951 Today's Date: 08/20/2014   History of Present Illness  64 yo M with 1 day history of Left arm weakness and numbness. MRI revealed acute infarct R posterior frontal cortex. Patient had prior R frontal lobe infarct, watershed between ACA/MCA a few weeks ago.  Clinical Impression  Pt functioning near baseline with exception of functional use of L UE. Pt to benefit from outpt PT to address L UE deficits to improved ability to complete ADLs and use cane during ambulation.    Follow Up Recommendations Outpatient PT (for L UE)    Equipment Recommendations  None recommended by PT    Recommendations for Other Services       Precautions / Restrictions Precautions Precautions: Fall Restrictions Weight Bearing Restrictions: No      Mobility  Bed Mobility Overal bed mobility: Needs Assistance Bed Mobility: Supine to Sit     Supine to sit: Supervision     General bed mobility comments: supervision due to quick mvmt  Transfers Overall transfer level: Needs assistance Equipment used: None Transfers: Sit to/from Stand Sit to Stand: Supervision         General transfer comment: pt quick to move  Ambulation/Gait Ambulation/Gait assistance: Modified independent (Device/Increase time) Ambulation Distance (Feet): 200 Feet Assistive device: None Gait Pattern/deviations: Step-through pattern;Antalgic   Gait velocity interpretation: at or above normal speed for age/gender General Gait Details: pt with antalgic gait pattern due to R LE fatigue and onset of pain with long distance ambulation. uses cane at baseline for long distance ambulation  Stairs            Wheelchair Mobility    Modified Rankin (Stroke Patients Only) Modified Rankin (Stroke Patients Only) Pre-Morbid Rankin Score: Moderate disability Modified Rankin: Moderate disability     Balance     Sitting  balance-Leahy Scale: Normal Sitting balance - Comments: Able to reach outside BoS to donn socks, no LOB.   Standing balance support: No upper extremity supported Standing balance-Leahy Scale: Fair (pt stood to urinate without difficulty)                               Pertinent Vitals/Pain Pain Assessment: No/denies pain    Home Living Family/patient expects to be discharged to:: Private residence Living Arrangements: Alone Available Help at Discharge: Personal care attendant (7 days/week x 2 hours/day for housework) Type of Home: Apartment Home Access: Elevator     Home Layout: One level Home Equipment: Cane - single point;Grab bars - tub/shower      Prior Function Level of Independence: Needs assistance   Gait / Transfers Assistance Needed: Uses SPC at all times when walking.   ADL's / Homemaking Assistance Needed: Aide assists with housework, meal prep and transportation        Hand Dominance   Dominant Hand: Right    Extremity/Trunk Assessment   Upper Extremity Assessment: LUE deficits/detail       LUE Deficits / Details: Has movement at all joints but very weak. Unable to take any resistance. Distal weakness greater than proximal weakness. PROM intact. Patient has difficulty grasping objects, decreased fine motor coordination, and decreased sensation in digits.   Lower Extremity Assessment: Overall WFL for tasks assessed      Cervical / Trunk Assessment: Normal  Communication   Communication: HOH  Cognition Arousal/Alertness: Awake/alert Behavior During Therapy: WFL for tasks assessed/performed Overall Cognitive  Status: Within Functional Limits for tasks assessed (easily disctracted, difficult to focus)                      General Comments      Exercises        Assessment/Plan    PT Assessment Patient needs continued PT services  PT Diagnosis Generalized weakness   PT Problem List Decreased balance;Decreased activity  tolerance;Decreased mobility  PT Treatment Interventions DME instruction;Gait training;Stair training;Balance training;Therapeutic exercise   PT Goals (Current goals can be found in the Care Plan section) Acute Rehab PT Goals Patient Stated Goal: to go home PT Goal Formulation: With patient Additional Goals Additional Goal #1: Pt to score >51 oN Berg to indicate minimal falls risk.    Frequency Min 3X/week   Barriers to discharge        Co-evaluation               End of Session Equipment Utilized During Treatment: Gait belt Activity Tolerance: Patient tolerated treatment well Patient left: with call bell/phone within reach;in chair Nurse Communication: Mobility status         Time: 7129-2909 PT Time Calculation (min) (ACUTE ONLY): 19 min   Charges:   PT Evaluation $Initial PT Evaluation Tier I: 1 Procedure     PT G CodesKingsley Rangel 08/20/2014, 3:42 PM   [name], PT, DPT Pager #: [phone] Office #: [phone]

## 2014-08-20 NOTE — Progress Notes (Signed)
Patient is being d/c home. D/c instruction given to patient and patient verbalized understanding.

## 2014-08-20 NOTE — H&P (Signed)
[facility] Admission History and Physical Service Pager: [phone]  Patient name: [name] record number: [id number] Date of birth: December 02, 1952Age: 64 y.o.Gender: male  Primary Care Provider: Nobie Putnam, DO Consultants: Neuro Code Status: DNR/DNI  Chief Complaint: Left arm weakness and numbness  Assessment and Plan: [name] is a 64 y.o. male presenting with left arm weakness and numbness suggestive for stroke vs.TIA. PMH is significant for recent hospitalizations for CVA, HTN, CAD, PAD, HFpEF, tobacco use, h/o DVT/PE last in 02/2014.  Left arm weakness/numbness likely stroke/TIA: sudden onset. Similar presentation on recent hospitalization, pt was told to restart xarelto on discharge at the end of June but never picked up the prescription. Likely ischemic vs. hemorrhagic given his soft BP on arrival to ED. Neuro exam significant for 4/5 strength (at shoulder, elbow and grip), decreased sensation on the left. No acute intracranial pathology on CT head but newly seen foci of acute infarction in the right posterior frontal cortex near the vertex w/o mass effect or hemorrhage on MRI brian. Multiple risk factors including compliance issue with his Xarelto. Last LDL 75, HDL 30 in 07/2014. On atorvastatin 40. He may need high intensity (80 mg) given his strong risk. His imaging from recent hospitalization correlates with his presentation. Given recent hx of stroke, he is not a candidate for tPA. Neuro consulted and saw patient in ED. - Admit to step-down - Neuro exam q 2hrs - Appreciate neuro recs: - ASA 325 mg + Plavix 75 mg daily - hold home lisinopril - Atorvastatin 80 mg daily - Consider reimaging if symptoms worsen - lipid panels  AKI: Cr 1.69 on admission. B/l seems to be ~0.9. Likely pre-renal given his low BP on arrival. 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 - NS  153m/hr - BMP in AM   HTN: 98/57 on arrival. On lisinopril 20 mg at home - hold home lisinopril as hypotension could be the insult for his CVA  PAD: DP not palpable, PT pulses palpable, 2+ edema in LE. LE arterial duplex in 2014 with severe proximal disease in right leg and  mild to moderate range for proximal disease in the left..On lasix 40 mg & gabapentin 100 mg. - hold home lasix - continue home gabapentin - consider arterial duplex   CAD/MI/CHF: hx of MI in 2000 per EMR. Also single vessel cath in 2012. No chest pain or dyspnea this time. Mild cardiac enlargement on CXR. LDL & HDL 75 & 30 resp in 07/2014. Recent 2D echo on 06/23 with EF of 50-55% with grade 1DD. A1c 6.4 on 6/23. -hold home lisinopril -atorvastatin as above -ASA as above  Hx of DVT/PE: his PVL in 02/2014 significant for RLE DVT involving the popliteal vein. And superficial thrombosis of calf veins. On Xarelto in the past but poor compliance. - will continue antiplatelets for now - discuss restarting xarelto  COPD: on albuterol neb and Spiriva at home. Not on oxygen. No SOB or respiratory symptoms this time. - continue home spiriva - Duonebs q2hrs prn - O2 for SpO < 88%   OSA: not clear if he uses CPAP at home.  Smoking: a pack a day for 30 yrs.  - Encourage quitting - nicotine patch if requested  FEN/GI:  F: NS 125 ml/hr E: replete as needed N: NPO pending SLP eval  GI: protonix  Prophylaxis:   Disposition: step-down  History of Present Illness: Kenneth KNITTLEis a 64y.o. male presenting with weakness and numbness  in his left arm via EMS. PMH is significant for recent hospitalizations for CVA, HTN, CAD, PAD, HFpEF, tobacco use, h/o DVT/PE last in 02/2014  Patient was mowing friend's lawn around 4pm when his left arm started getting weak then numb. He denies neurologic symptoms in his face and legs. However, he endorses "really bad" sharp throbbing pain in his head radiating to into his eyes when  he started to have weakness and numbness in his arm. The pain has now resolved. He also felt like he was going to pass out but didn't. He denies chest pain, SOB, abdominal pain, N/V, Denies SOB, chest pain, trouble breathing.   Off note patient was hospitalized 6/22 to 6/24 to [facility] service similar symptoms. At that time his labs were remarkable for AKI with a Cr of 1.8. BNP of 42 and negative troponin. His EKG was NSR. MRI significant for 5 punctate right frontal lobe lesions around a watershed area. MRI/MRA revealed a high-grade right paraclinoid ICA and moderate right A1 segment stenoses that correlate with suspected watershed infarcts. Carotids dopplers were negative with 1-39% stenosis of ICAs bilaterally. Echo was stable with an EF of 50-55% and grade 1 diastolic dysfunction  Patient was discharged on Xarelto and other medications but unable to pick up Xarelto. It was reported in his EMR that he had self-discontinued xarelto several months earlier without informing his PCP as he was experiencing rectal bleeding.   Patient reports smoking about a pack a day for over 30 years. He quit drinking about 6 months ago. He denies drug use.  Review Of Systems: Per HPI. Otherwise 12 point review of systems was performed and was unremarkable.  Patient Active Problem List   Diagnosis Date Noted  . Cerebral infarction due to unspecified mechanism   . Other specified hypotension   . AKI (acute kidney injury)   . Stroke   . CVA (cerebral infarction) 08/01/2014  . Alcohol dependence with withdrawal with complication 52/84/1324  . DVT (deep venous thrombosis) 02/22/2014  . Lower extremity edema 02/21/2014  . Diastolic CHF, acute on chronic 11/21/2013  . CAD (coronary artery disease) 11/21/2013  . Alcohol abuse 11/21/2013  . GERD (gastroesophageal reflux disease) 08/10/2013  . Gout 08/10/2013  . Tobacco abuse 07/26/2013  . Dizziness 04/10/2013  .  Binocular vision disorder with diplopia 01/03/2013  . Fracture of inferior orbital wall 01/03/2013  . Peripheral vascular disease 07/27/2012  . COPD (chronic obstructive pulmonary disease) 04/04/2012  . HTN (hypertension) 04/04/2012   Past Medical History: Past Medical History  Diagnosis Date  . Depression   . Hypertension   . Hyperlipidemia   . Myocardial infarction 2000  . CHF (congestive heart failure)   . COPD (chronic obstructive pulmonary disease)   . Peripheral vascular disease, unspecified     08/20/10 doppler: increase in right ABI post-op. Left ABI stable. S/P bi-fem bypass surgery  . GERD (gastroesophageal reflux disease)   . Asthma   . Anxiety   . History of DVT of lower extremity   . CAD (coronary artery disease) 05/27/10    Cath: severe single vessell CAD left cx midportion obtuse marginal 2 to 3.  . Shortness of breath   . Pneumonia 04/04/2012  . Arthritis   . OSA on CPAP   . Hyperlipemia   . COPD (chronic obstructive pulmonary disease)   . Alcohol abuse     H/O  . Tobacco abuse   . Orbital fracture 12/2012   Past Surgical History: Past Surgical History  Procedure  Laterality Date  . Femoral bypass  08/19/10    Right Fem-Pop  . Aortogram w/ ptca  09/292003  . Eye surgery    . Back surgery    . Total knee arthroplasty    . Cholecystectomy    . Cardiac catheterization  05/27/10    severe CAD left cx  . Abdoninal ao angio & bifem angio  05/27/10    Patent graft, occluded bil stents with no retrograde flow into the hypogastric arteries. 100% occl left ant. tibial artery, 70% to 80% to 100% stenosis right superficial fem artery above adductor canal. 100 % occl right ant. tibial vessell  . Esophagogastric fundoplication     Social History: History  Substance Use Topics  . Smoking status: Current Every Day Smoker -- 1.00  packs/day for 50 years    Types: Cigars, Cigarettes    Start date: 02/09/1961  . Smokeless tobacco: Former Systems developer    Types: Chew     Comment: 2 ppd, full flavor  . Alcohol Use: Yes     Comment: occ   Additional social history:   Please also refer to relevant sections of EMR.  Family History: Family History  Problem Relation Age of Onset  . Heart attack Mother   . Cirrhosis Father   . Heart failure Mother   . Heart failure Brother   . Cancer Brother    Allergies and Medications: Allergies  Allergen Reactions  . Darvocet [Propoxyphene N-Acetaminophen] Hives  . Haldol [Haloperidol Decanoate] Hives   No current facility-administered medications on file prior to encounter.   Current Outpatient Prescriptions on File Prior to Encounter  Medication Sig Dispense Refill  . atorvastatin (LIPITOR) 40 MG tablet Take 1 tablet (40 mg total) by mouth daily at 6 PM. 30 tablet 0  . furosemide (LASIX) 40 MG tablet Take 1 tablet (40 mg total) by mouth daily as needed (swelling in legs or an increased weight by 3-4 pounds up from baseline). 30 tablet 0  . gabapentin (NEURONTIN) 100 MG capsule Take 1 capsule (100 mg total) by mouth 3 (three) times daily. 90 capsule 0  . lisinopril (PRINIVIL,ZESTRIL) 20 MG tablet Take 0.5 tablets (10 mg total) by mouth daily. 90 tablet 3  . omeprazole (PRILOSEC) 20 MG capsule Take 1 capsule (20 mg total) by mouth daily. 30 capsule 11  . Pyridoxine HCl (VITAMIN B-6 PO) Take 1 tablet by mouth daily.    . vitamin B-12 (CYANOCOBALAMIN) 100 MCG tablet Take 100 mcg by mouth daily.    . vitamin E 200 UNIT capsule Take 200 Units by mouth daily.    [name] albuterol (PROVENTIL HFA;VENTOLIN HFA) 108 (90 BASE) MCG/ACT inhaler Inhale 2 puffs into the lungs every 6 (six) hours as needed for wheezing or shortness of breath. (Patient not taking: Reported on 08/19/2014) 1 Inhaler  3  . clotrimazole-betamethasone (LOTRISONE) cream Apply 1 application topically 2 (two) times daily. 30 g 0  . mometasone-formoterol (DULERA) 200-5 MCG/ACT AERO Inhale 2 puffs into the lungs 2 (two) times daily. (Patient not taking: Reported on 06/18/2014) 13 g 3  . nortriptyline (PAMELOR) 25 MG capsule Take 1 capsule (25 mg total) by mouth at bedtime. (Patient not taking: Reported on 06/18/2014) 30 capsule 1  . oxyCODONE-acetaminophen (PERCOCET) 5-325 MG per tablet Take 2 tablets by mouth every 6 (six) hours as needed for moderate pain. (Patient not taking: Reported on 06/18/2014) 12 tablet 0  . Rivaroxaban (XARELTO STARTER PACK) 15 & 20 MG TBPK '15mg'$  tablet by mouth twice a  day with food change to 20 mg tab once daily on day 22. (Patient not taking: Reported on 06/18/2014) 51 each 0  . Spacer/Aero-Holding Chambers (BREATHERITE COLL SPACER ADULT) MISC 1 Device by Does not apply route as needed (with inhaler). 1 each 0  . tiotropium (SPIRIVA) 18 MCG inhalation capsule Place 1 capsule (18 mcg total) into inhaler and inhale daily. 30 capsule 3    Objective: BP 103/63 mmHg  Pulse 82  Temp(Src) 97.9 F (36.6 C) (Rectal)  Resp 21  Ht 6' (1.829 m)  Wt 255 lb (115.667 kg)  BMI 34.58 kg/m2  SpO2 98%  Exam: Gen: NAD. AAOx3, cooperative with exam HEENT: Atraumatic, EOMI, PERRLA, Oropharynx clear. MMM CV: RRR. No murmurs. 2+ radial.  DP pulses hard to palpate bilaterally. Resp: CTAB. No wheezing, crackles, or rhonchi noted. Abd: +BS. Soft, non-distended, non-tender. No rebound or guarding.  Ext: 2+ pitting edema. Neuro: AAOx3, CN II-XII intact, 4/5 strength (shoulder, elbow and grip), decreased sensation and relatively strong biceps reflex in left arm. Rt arm and LE exams WNL.    Labs and Imaging: CBC BMET   Last Labs      Recent Labs Lab 08/19/14 1823 08/19/14 1830  WBC 10.9* --   HGB 16.9 18.0*  HCT 50.4 53.0*  PLT 166 --       Last  Labs      Recent Labs Lab 08/19/14 1823 08/19/14 1830  NA 141 141  K 4.0 3.9  CL 105 105  CO2 23 --   BUN 11 14  CREATININE 1.69* 1.60*  GLUCOSE 117* 121*  CALCIUM 10.3 --        Ct Head Wo Contrast  08/19/2014    IMPRESSION: No acute intracranial pathology.  Focal ill-defined hypodensity in the right occipital lobe, possibly artifactual. Acute ischemia is not excluded. MRI may provide better evaluation.  Moderate age-related atrophy and chronic microvascular ischemic disease.  Critical Value/emergent results were called by telephone at the time of interpretation on 08/19/2014 at 6:41 pm to Dr. Lacretia Leigh , who verbally acknowledged these results.   Electronically Signed   By: Anner Crete M.D.   On: 08/19/2014 18:44   Mr Brain Wo Contrast  08/19/2014   CLINICAL DATA:   IMPRESSION: Newly seen foci of acute infarction in the right posterior frontal cortex near the vertex. This could represent extension of watershed infarction or could be new embolic infarctions. No mass effect or hemorrhage in this location.  Extensive chronic small vessel ischemic changes elsewhere throughout the brain.  Increased FLAIR signal in the sulci posteriorly could relate to increased protein content of the CSF.   Electronically Signed   By: Nelson Chimes M.D.   On: 08/19/2014 20:07   Dg Chest Portable 1 View  08/19/2014    IMPRESSION: Mild cardiac enlargement. Linear atelectasis in the left lung base. No evidence of active pulmonary disease.   Electronically Signed   By: Lucienne Capers M.D.   On: 08/19/2014 21:30    Mercy Riding, MD 08/19/2014, 9:14 PM PGY-1, [place] Intern pager: [phone], text pages welcome          I have seen and examined the patient. I have read and agree with the above note. My changes are noted in blue.  Tawanna Sat, MD 08/20/2014, 6:45 AM PGY-3, [place] Intern Pager: [phone], text pages  welcome

## 2014-08-22 ENCOUNTER — Telehealth: Payer: Self-pay | Admitting: Family Medicine

## 2014-08-22 ENCOUNTER — Telehealth: Payer: Self-pay | Admitting: *Deleted

## 2014-08-22 DIAGNOSIS — R7881 Bacteremia: Secondary | ICD-10-CM

## 2014-08-22 NOTE — Telephone Encounter (Signed)
I spoke with [name] by phone His speech was clear and his responses were coherent and logically related to questions.  I informed him of the 1 out of 2 blood cultures growing aerobic GPCB after 3 days incubation. He denies fever, shaking chills, malaise, Flu-like symptoms. He knows he has a hospital follow up appointment at [facility] on 7/18.   A/P GPCB in one blood culture drawn 08/19/14 in ED in stroke workup.  - Asymptomatic currently - Given only one culture bottle positive, slow growth, indication for blood culture collection was in patient with new acute ischemic stroke, and pt is asymptomatic, my working explanation is the postive culture represents a contaminant.  - Reviewed symptoms that if they develop in [name], he should seek healthcare immediately, including fever, chills, Flu-like symptoms, just not feeling well, etc.. He agreed.  [facility] Follow on 08/27/14 with Dr Parks Ranger.

## 2014-08-22 NOTE — Telephone Encounter (Signed)
RN from hospital calling with the following positive blood culture:    GRAM POSITIVE COCCOBACILLUS  AEROBIC BOTTLE ONLY  CRITICAL RESULT CALLED TO, READ BACK BY AND VERIFIED WITH: A BENTON 08/22/14 @ 62 M VESTAL         Patient recently discharged and has follow up scheduled with PCP on 7/18.

## 2014-08-22 NOTE — Telephone Encounter (Signed)
Patient recently hospitalized 7/10 to 7/11 for TIA, previous history of CVA. Known history of non-adherence to anticoagulation, anti-plt therapy. Additionally history of drug abuse with cocaine positive on admit 7/10. [facility] notified with critical results of positive blood culture x 1 out of 2 with Gram Positive Coccobacillus (other blood culture NGTD x 2 days) both collected on 7/10 @ 2010. Unclear indication for drawing blood cultures initially, appear to be done in the ED but I do not see any documentation of fevers or symptoms indicating this blood culture. Additionally no further plan in notes regarding this on discharge.  Discussed results with attending, Dr. Gwendlyn Deutscher.  Called patient, but unable to reach, received his voicemail box and LVM stating for him to call [facility] back to discuss positive results from the hospital recently, and advised him that he needs to schedule a Lab Only visit for this week, prefer tomorrow 7/14 to get repeat blood cultures drawn at [facility] (Future Orders placed in Epic for blood culture peripheral x 2). Additionally, I ask that he calls and reports any symptoms he has including fevers, sweats, or any signs of infection. If he is asymptomatic, then this may be a contaminate (1 out of 2 positive cultures) and we will repeat them. Otherwise if he has symptoms, then he needs to go to ED for repeat testing and evaluation, and likely empiric antibiotics.  Additionally, unclear if patient still wishes to follow-up with [facility] previously was advised he planned to arrange different follow-up for future care. Will discuss with patient in future, otherwise scheduled for hospital follow-up on 7/18.  Nobie Putnam, [place], PGY-3

## 2014-08-24 LAB — CULTURE, BLOOD (ROUTINE X 2): CULTURE: NO GROWTH

## 2014-08-26 ENCOUNTER — Inpatient Hospital Stay (HOSPITAL_COMMUNITY)
Admission: EM | Admit: 2014-08-26 | Discharge: 2014-08-29 | DRG: 065 | Disposition: A | Discharge status: Home / Self Care | Payer: Medicare Other | Attending: Family Medicine | Admitting: Family Medicine

## 2014-08-26 ENCOUNTER — Encounter (HOSPITAL_COMMUNITY): Payer: Self-pay | Admitting: *Deleted

## 2014-08-26 ENCOUNTER — Emergency Department (HOSPITAL_COMMUNITY): Payer: Medicare Other

## 2014-08-26 ENCOUNTER — Observation Stay (HOSPITAL_COMMUNITY): Payer: Medicare Other

## 2014-08-26 DIAGNOSIS — N179 Acute kidney failure, unspecified: Secondary | ICD-10-CM | POA: Diagnosis present

## 2014-08-26 DIAGNOSIS — R519 Headache, unspecified: Secondary | ICD-10-CM | POA: Diagnosis present

## 2014-08-26 DIAGNOSIS — F1910 Other psychoactive substance abuse, uncomplicated: Secondary | ICD-10-CM | POA: Diagnosis present

## 2014-08-26 DIAGNOSIS — F17220 Nicotine dependence, chewing tobacco, uncomplicated: Secondary | ICD-10-CM | POA: Diagnosis present

## 2014-08-26 DIAGNOSIS — G8194 Hemiplegia, unspecified affecting left nondominant side: Secondary | ICD-10-CM | POA: Diagnosis present

## 2014-08-26 DIAGNOSIS — J45909 Unspecified asthma, uncomplicated: Secondary | ICD-10-CM | POA: Diagnosis present

## 2014-08-26 DIAGNOSIS — F329 Major depressive disorder, single episode, unspecified: Secondary | ICD-10-CM | POA: Diagnosis present

## 2014-08-26 DIAGNOSIS — I639 Cerebral infarction, unspecified: Secondary | ICD-10-CM

## 2014-08-26 DIAGNOSIS — Z720 Tobacco use: Secondary | ICD-10-CM | POA: Diagnosis present

## 2014-08-26 DIAGNOSIS — I2510 Atherosclerotic heart disease of native coronary artery without angina pectoris: Secondary | ICD-10-CM | POA: Diagnosis present

## 2014-08-26 DIAGNOSIS — Z7902 Long term (current) use of antithrombotics/antiplatelets: Secondary | ICD-10-CM

## 2014-08-26 DIAGNOSIS — I63411 Cerebral infarction due to embolism of right middle cerebral artery: Secondary | ICD-10-CM

## 2014-08-26 DIAGNOSIS — J449 Chronic obstructive pulmonary disease, unspecified: Secondary | ICD-10-CM | POA: Diagnosis present

## 2014-08-26 DIAGNOSIS — Z7982 Long term (current) use of aspirin: Secondary | ICD-10-CM

## 2014-08-26 DIAGNOSIS — F419 Anxiety disorder, unspecified: Secondary | ICD-10-CM | POA: Diagnosis present

## 2014-08-26 DIAGNOSIS — Z66 Do not resuscitate: Secondary | ICD-10-CM | POA: Diagnosis present

## 2014-08-26 DIAGNOSIS — R51 Headache: Secondary | ICD-10-CM | POA: Diagnosis present

## 2014-08-26 DIAGNOSIS — Z79899 Other long term (current) drug therapy: Secondary | ICD-10-CM

## 2014-08-26 DIAGNOSIS — Z6832 Body mass index (BMI) 32.0-32.9, adult: Secondary | ICD-10-CM

## 2014-08-26 DIAGNOSIS — I5032 Chronic diastolic (congestive) heart failure: Secondary | ICD-10-CM | POA: Diagnosis present

## 2014-08-26 DIAGNOSIS — Z79891 Long term (current) use of opiate analgesic: Secondary | ICD-10-CM

## 2014-08-26 DIAGNOSIS — I252 Old myocardial infarction: Secondary | ICD-10-CM

## 2014-08-26 DIAGNOSIS — Z8249 Family history of ischemic heart disease and other diseases of the circulatory system: Secondary | ICD-10-CM

## 2014-08-26 DIAGNOSIS — Z8673 Personal history of transient ischemic attack (TIA), and cerebral infarction without residual deficits: Secondary | ICD-10-CM

## 2014-08-26 DIAGNOSIS — Z888 Allergy status to other drugs, medicaments and biological substances status: Secondary | ICD-10-CM

## 2014-08-26 DIAGNOSIS — E669 Obesity, unspecified: Secondary | ICD-10-CM | POA: Diagnosis present

## 2014-08-26 DIAGNOSIS — I959 Hypotension, unspecified: Secondary | ICD-10-CM | POA: Diagnosis present

## 2014-08-26 DIAGNOSIS — E785 Hyperlipidemia, unspecified: Secondary | ICD-10-CM | POA: Diagnosis present

## 2014-08-26 DIAGNOSIS — I10 Essential (primary) hypertension: Secondary | ICD-10-CM | POA: Insufficient documentation

## 2014-08-26 DIAGNOSIS — I6529 Occlusion and stenosis of unspecified carotid artery: Secondary | ICD-10-CM | POA: Diagnosis present

## 2014-08-26 DIAGNOSIS — G4733 Obstructive sleep apnea (adult) (pediatric): Secondary | ICD-10-CM | POA: Diagnosis present

## 2014-08-26 DIAGNOSIS — F1410 Cocaine abuse, uncomplicated: Secondary | ICD-10-CM | POA: Diagnosis present

## 2014-08-26 DIAGNOSIS — R471 Dysarthria and anarthria: Secondary | ICD-10-CM | POA: Diagnosis present

## 2014-08-26 DIAGNOSIS — I739 Peripheral vascular disease, unspecified: Secondary | ICD-10-CM | POA: Diagnosis present

## 2014-08-26 DIAGNOSIS — Z9114 Patient's other noncompliance with medication regimen: Secondary | ICD-10-CM | POA: Diagnosis present

## 2014-08-26 DIAGNOSIS — Z86718 Personal history of other venous thrombosis and embolism: Secondary | ICD-10-CM

## 2014-08-26 DIAGNOSIS — K219 Gastro-esophageal reflux disease without esophagitis: Secondary | ICD-10-CM | POA: Diagnosis present

## 2014-08-26 DIAGNOSIS — F17210 Nicotine dependence, cigarettes, uncomplicated: Secondary | ICD-10-CM | POA: Diagnosis present

## 2014-08-26 DIAGNOSIS — Z86711 Personal history of pulmonary embolism: Secondary | ICD-10-CM

## 2014-08-26 HISTORY — DX: Cerebral infarction, unspecified: I63.9

## 2014-08-26 LAB — COMPREHENSIVE METABOLIC PANEL
ALBUMIN: 4 g/dL (ref 3.5–5.0)
ALT: 26 U/L (ref 17–63)
ANION GAP: 12 (ref 5–15)
AST: 29 U/L (ref 15–41)
Alkaline Phosphatase: 69 U/L (ref 38–126)
BILIRUBIN TOTAL: 0.7 mg/dL (ref 0.3–1.2)
BUN: 16 mg/dL (ref 6–20)
CALCIUM: 9.6 mg/dL (ref 8.9–10.3)
CO2: 23 mmol/L (ref 22–32)
Chloride: 100 mmol/L — ABNORMAL LOW (ref 101–111)
Creatinine, Ser: 1.36 mg/dL — ABNORMAL HIGH (ref 0.61–1.24)
GFR calc Af Amer: >60 mL/min (ref >60)
GFR, EST NON AFRICAN AMERICAN: 53 mL/min — AB (ref >60)
GLUCOSE: 133 mg/dL — AB (ref 65–99)
Potassium: 4 mmol/L (ref 3.5–5.1)
Sodium: 135 mmol/L (ref 135–145)
TOTAL PROTEIN: 7.4 g/dL (ref 6.5–8.1)

## 2014-08-26 LAB — DIFFERENTIAL
Basophils Absolute: 0.1 10*3/uL (ref 0.0–0.1)
Basophils Relative: 1 % (ref 0–1)
EOS PCT: 4 % (ref 0–5)
Eosinophils Absolute: 0.3 10*3/uL (ref 0.0–0.7)
LYMPHS ABS: 2.3 10*3/uL (ref 0.7–4.0)
Lymphocytes Relative: 31 % (ref 12–46)
MONO ABS: 0.9 10*3/uL (ref 0.1–1.0)
MONOS PCT: 12 % (ref 3–12)
Neutro Abs: 4.1 10*3/uL (ref 1.7–7.7)
Neutrophils Relative %: 52 % (ref 43–77)

## 2014-08-26 LAB — CBC
HEMATOCRIT: 46.2 % (ref 39.0–52.0)
Hemoglobin: 15.7 g/dL (ref 13.0–17.0)
MCH: 30.9 pg (ref 26.0–34.0)
MCHC: 34 g/dL (ref 30.0–36.0)
MCV: 90.9 fL (ref 78.0–100.0)
PLATELETS: 175 10*3/uL (ref 150–400)
RBC: 5.08 MIL/uL (ref 4.22–5.81)
RDW: 13.8 % (ref 11.5–15.5)
WBC: 7.7 10*3/uL (ref 4.0–10.5)

## 2014-08-26 LAB — PROTIME-INR
INR: 1.01 (ref 0.00–1.49)
PROTHROMBIN TIME: 13.5 s (ref 11.6–15.2)

## 2014-08-26 LAB — I-STAT CHEM 8, ED
BUN: 19 mg/dL (ref 6–20)
CALCIUM ION: 1.13 mmol/L (ref 1.13–1.30)
Chloride: 101 mmol/L (ref 101–111)
Creatinine, Ser: 1.3 mg/dL — ABNORMAL HIGH (ref 0.61–1.24)
Glucose, Bld: 134 mg/dL — ABNORMAL HIGH (ref 65–99)
HCT: 50 % (ref 39.0–52.0)
Hemoglobin: 17 g/dL (ref 13.0–17.0)
Potassium: 3.8 mmol/L (ref 3.5–5.1)
Sodium: 137 mmol/L (ref 135–145)
TCO2: 20 mmol/L (ref 0–100)

## 2014-08-26 LAB — GLUCOSE, CAPILLARY: Glucose-Capillary: 110 mg/dL — ABNORMAL HIGH (ref 65–99)

## 2014-08-26 LAB — APTT: aPTT: 27 seconds (ref 24–37)

## 2014-08-26 LAB — I-STAT TROPONIN, ED: Troponin i, poc: 0 ng/mL (ref 0.00–0.08)

## 2014-08-26 LAB — CBG MONITORING, ED: Glucose-Capillary: 168 mg/dL — ABNORMAL HIGH (ref 65–99)

## 2014-08-26 MED ORDER — GABAPENTIN 100 MG PO CAPS
100.0000 mg | ORAL_CAPSULE | Freq: Three times a day (TID) | ORAL | Status: DC
  Administered 2014-08-27 – 2014-08-29 (×8): 100 mg via ORAL
  Filled 2014-08-26 (×8): qty 1

## 2014-08-26 MED ORDER — SENNOSIDES-DOCUSATE SODIUM 8.6-50 MG PO TABS
1.0000 | ORAL_TABLET | Freq: Every evening | ORAL | Status: DC | PRN
  Administered 2014-08-28: 1 via ORAL
  Filled 2014-08-26: qty 1

## 2014-08-26 MED ORDER — ACETAMINOPHEN 650 MG RE SUPP: 650.0000 mg | RECTAL | Status: DC | PRN

## 2014-08-26 MED ORDER — PANTOPRAZOLE SODIUM 40 MG PO TBEC
40.0000 mg | DELAYED_RELEASE_TABLET | Freq: Every day | ORAL | Status: DC
  Administered 2014-08-27 – 2014-08-29 (×3): 40 mg via ORAL
  Filled 2014-08-26 (×3): qty 1

## 2014-08-26 MED ORDER — STROKE: EARLY STAGES OF RECOVERY BOOK
Freq: Once | Status: AC
  Administered 2014-08-26: 23:00:00

## 2014-08-26 MED ORDER — ASPIRIN EC 325 MG PO TBEC
325.0000 mg | DELAYED_RELEASE_TABLET | Freq: Every day | ORAL | Status: DC
  Administered 2014-08-27 – 2014-08-29 (×3): 325 mg via ORAL
  Filled 2014-08-26 (×3): qty 1

## 2014-08-26 MED ORDER — HEPARIN SODIUM (PORCINE) 5000 UNIT/ML IJ SOLN
5000.0000 [IU] | Freq: Three times a day (TID) | INTRAMUSCULAR | Status: DC
  Administered 2014-08-26 – 2014-08-29 (×9): 5000 [IU] via SUBCUTANEOUS
  Filled 2014-08-26 (×9): qty 1

## 2014-08-26 MED ORDER — TIOTROPIUM BROMIDE MONOHYDRATE 18 MCG IN CAPS
18.0000 ug | ORAL_CAPSULE | Freq: Every day | RESPIRATORY_TRACT | Status: DC
  Filled 2014-08-26: qty 5

## 2014-08-26 MED ORDER — LORAZEPAM 1 MG PO TABS: 1.0000 mg | ORAL_TABLET | Freq: Four times a day (QID) | ORAL | Status: DC | PRN

## 2014-08-26 MED ORDER — LORAZEPAM 2 MG/ML IJ SOLN: 1.0000 mg | Freq: Four times a day (QID) | INTRAMUSCULAR | Status: DC | PRN

## 2014-08-26 MED ORDER — ASPIRIN 300 MG RE SUPP
300.0000 mg | Freq: Once | RECTAL | Status: DC
  Filled 2014-08-26: qty 1

## 2014-08-26 MED ORDER — ACETAMINOPHEN 325 MG PO TABS
650.0000 mg | ORAL_TABLET | ORAL | Status: DC | PRN
  Administered 2014-08-29: 650 mg via ORAL
  Filled 2014-08-26: qty 2

## 2014-08-26 MED ORDER — ALBUTEROL SULFATE (2.5 MG/3ML) 0.083% IN NEBU: 2.5000 mg | INHALATION_SOLUTION | RESPIRATORY_TRACT | Status: DC | PRN

## 2014-08-26 MED ORDER — VITAMIN B-1 100 MG PO TABS
100.0000 mg | ORAL_TABLET | Freq: Every day | ORAL | Status: DC
  Administered 2014-08-28 – 2014-08-29 (×2): 100 mg via ORAL
  Filled 2014-08-26 (×3): qty 1

## 2014-08-26 MED ORDER — ATORVASTATIN CALCIUM 80 MG PO TABS
80.0000 mg | ORAL_TABLET | Freq: Every day | ORAL | Status: DC
  Administered 2014-08-27 – 2014-08-28 (×2): 80 mg via ORAL
  Filled 2014-08-26 (×2): qty 1

## 2014-08-26 MED ORDER — FOLIC ACID 1 MG PO TABS
1.0000 mg | ORAL_TABLET | Freq: Every day | ORAL | Status: DC
  Administered 2014-08-27 – 2014-08-29 (×3): 1 mg via ORAL
  Filled 2014-08-26 (×3): qty 1

## 2014-08-26 MED ORDER — MORPHINE SULFATE 2 MG/ML IJ SOLN
2.0000 mg | Freq: Once | INTRAMUSCULAR | Status: AC
  Administered 2014-08-26: 2 mg via INTRAVENOUS
  Filled 2014-08-26: qty 1

## 2014-08-26 MED ORDER — THIAMINE HCL 100 MG/ML IJ SOLN
100.0000 mg | Freq: Every day | INTRAMUSCULAR | Status: DC
  Administered 2014-08-27: 100 mg via INTRAVENOUS
  Filled 2014-08-26 (×2): qty 2

## 2014-08-26 MED ORDER — CLOPIDOGREL BISULFATE 75 MG PO TABS
75.0000 mg | ORAL_TABLET | Freq: Every day | ORAL | Status: DC
  Administered 2014-08-27 – 2014-08-29 (×3): 75 mg via ORAL
  Filled 2014-08-26 (×3): qty 1

## 2014-08-26 MED ORDER — ADULT MULTIVITAMIN W/MINERALS CH
1.0000 | ORAL_TABLET | Freq: Every day | ORAL | Status: DC
  Administered 2014-08-27 – 2014-08-29 (×3): 1 via ORAL
  Filled 2014-08-26 (×3): qty 1

## 2014-08-26 NOTE — H&P (Signed)
[facility] Admission History and Physical Service Pager: [phone]  Patient name: [name] record number: [id number] Date of birth: 1952-04-01Age: 64 y.o.Gender: male  Primary Care Provider: Nobie Putnam, DO Consultants: Neuro Code Status: DNR/DNI  Chief Complaint: Left arm weakness and numbness  Assessment and Plan: [name] is a 64 y.o. male presenting with left arm weakness and numbness suggestive for stroke vs.TIA. PMH is significant for recent hospitalizations for CVA, HTN, CAD, PAD, HFpEF, tobacco use, h/o DVT/PE last in 02/2014.  Left arm weakness/numbness with drooling, likely stroke/TIA: sudden onset. Similar presentation on recent hospitalization. Neuro exam significant for 4/5 strength (at shoulder, elbow and grip), decreased sensation on the left. No acute intracranial pathology on CT head. His imaging from recent hospitalization correlates with his presentation. Given recent hx of stroke, he is not a candidate for tPA. Neuro consulted and saw patient in ED. - Admit to tele - Neuro checks q 2hrs - Appreciate neuro recs: - ASA 325 mg + Plavix 75 mg daily - Atorvastatin 80 mg daily - MRI/MRA ordered - carotid doppler, echo and risk stratification labs done in the last month so will not repeat  AKI: Cr 1.30 on admission, down from 1.6 last week. B/l seems to be ~0.9.  - BMP in AM   HTN: 110s/60s on arrival. Lisinopril stopped last admission - continue to hold home lisinopril   PAD: DP not palpable, PT pulses palpable, trace edema in LE. LE arterial duplex in 2014 with severe proximal disease in right leg and mild to moderate range for proximal disease in the left..On gabapentin 100 mg. - continue home gabapentin - consider repeat arterial duplex   CAD/MI/CHF: hx of MI in 2000 per EMR. Also single vessel cath in 2012. No chest pain or dyspnea this time. Recent 2D echo on 06/23 with  EF of 50-55% with grade 1DD. A1c 6.4 on 6/23. -atorvastatin as above -ASA as above  Hx of DVT/PE: his PVL in 02/2014 significant for RLE DVT involving the popliteal vein. And superficial thrombosis of calf veins. On Xarelto in the past but poor compliance. - will continue antiplatelets for now - discuss restarting xarelto  COPD: on albuterol neb and Spiriva at home. Not on oxygen. No SOB or respiratory symptoms this time. - continue home spiriva - Duonebs q2hrs prn - O2 for SpO < 88%  Smoking: a pack a day for 30 yrs.  - Encourage quitting - nicotine patch if requested  FEN/GI:  F: SLIV E: replete as needed N: NPO pending SLP eval  GI: protonix  Prophylaxis:  SQ heparin  Disposition: step-down  History of Present Illness: [name] is a 63 y.o. male presenting with weakness and numbness in his left arm PMH is significant for recent hospitalizations for CVA, HTN, CAD, PAD, HFpEF, tobacco use, h/o DVT/PE last in 02/2014  Patient awoke this am with left arm weakness and drooling which gradually worsened throughout the day. He also reports severe headache behind his right eye which is still present. He denies chest pain, SOB, abdominal pain, N/V, Denies SOB, chest pain, trouble breathing.   Off note patient was hospitalized 6/22 and 7/10 to [facility] service with similar symptoms. At that time his EKG was NSR. MRI significant for 5 punctate right frontal lobe lesions around a watershed area. MRI/MRA revealed a high-grade right paraclinoid ICA and moderate right A1 segment stenoses that correlate with suspected watershed infarcts. Carotids dopplers were negative with 1-39% stenosis of ICAs bilaterally.  Echo was stable with an EF of 50-55% and grade 1 diastolic dysfunction  Patient was discharged on aspirin and plavix but has only been taking plavix.  Patient reports smoking about a pack a day for over 30 years. He quit drinking about 6 months ago. He denies drug use.  Review Of  Systems: Per HPI. Otherwise 12 point review of systems was performed and was unremarkable.  Patient Active Problem List   Diagnosis Date Noted  . Cerebral infarction due to unspecified mechanism   . Other specified hypotension   . AKI (acute kidney injury)   . Stroke   . CVA (cerebral infarction) 08/01/2014  . Alcohol dependence with withdrawal with complication 90/24/0973  . DVT (deep venous thrombosis) 02/22/2014  . Lower extremity edema 02/21/2014  . Diastolic CHF, acute on chronic 11/21/2013  . CAD (coronary artery disease) 11/21/2013  . Alcohol abuse 11/21/2013  . GERD (gastroesophageal reflux disease) 08/10/2013  . Gout 08/10/2013  . Tobacco abuse 07/26/2013  . Dizziness 04/10/2013  . Binocular vision disorder with diplopia 01/03/2013  . Fracture of inferior orbital wall 01/03/2013  . Peripheral vascular disease 07/27/2012  . COPD (chronic obstructive pulmonary disease) 04/04/2012  . HTN (hypertension) 04/04/2012   Past Medical History: Past Medical History  Diagnosis Date  . Depression   . Hypertension   . Hyperlipidemia   . Myocardial infarction 2000  . CHF (congestive heart failure)   . COPD (chronic obstructive pulmonary disease)   . Peripheral vascular disease, unspecified     08/20/10 doppler: increase in right ABI post-op. Left ABI stable. S/P bi-fem bypass surgery  . GERD (gastroesophageal reflux disease)   . Asthma   . Anxiety   . History of DVT of lower extremity   . CAD (coronary artery disease) 05/27/10    Cath: severe single vessell CAD left cx midportion obtuse marginal 2 to 3.  . Shortness of breath   . Pneumonia 04/04/2012  . Arthritis   . OSA on CPAP   . Hyperlipemia   . COPD (chronic obstructive pulmonary disease)   . Alcohol abuse     H/O  . Tobacco abuse   . Orbital fracture 12/2012   Past Surgical  History: Past Surgical History  Procedure Laterality Date  . Femoral bypass  08/19/10    Right Fem-Pop  . Aortogram w/ ptca  09/292003  . Eye surgery    . Back surgery    . Total knee arthroplasty    . Cholecystectomy    . Cardiac catheterization  05/27/10    severe CAD left cx  . Abdoninal ao angio & bifem angio  05/27/10    Patent graft, occluded bil stents with no retrograde flow into the hypogastric arteries. 100% occl left ant. tibial artery, 70% to 80% to 100% stenosis right superficial fem artery above adductor canal. 100 % occl right ant. tibial vessell  . Esophagogastric fundoplication     Social History: History  Substance Use Topics  . Smoking status: Current Every Day Smoker -- 1.00 packs/day for 50 years    Types: Cigars, Cigarettes    Start date: 02/09/1961  . Smokeless tobacco: Former Systems developer    Types: Chew     Comment: 2 ppd, full flavor  . Alcohol Use: Yes     Comment: occ   Additional social history:   Please also refer to relevant sections of EMR.  Family History: Family History  Problem Relation Age of Onset  . Heart attack Mother   . Cirrhosis  Father   . Heart failure Mother   . Heart failure Brother   . Cancer Brother    Allergies and Medications: Allergies  Allergen Reactions  . Darvocet [Propoxyphene N-Acetaminophen] Hives  . Haldol [Haloperidol Decanoate] Hives   No current facility-administered medications on file prior to encounter.   Current Outpatient Prescriptions on File Prior to Encounter  Medication Sig Dispense Refill  . atorvastatin (LIPITOR) 40 MG tablet Take 1 tablet (40 mg total) by mouth daily at 6 PM. 30 tablet 0  . furosemide (LASIX) 40 MG tablet Take 1 tablet (40 mg total) by mouth daily as needed (swelling in legs or an increased weight by 3-4 pounds up from baseline). 30 tablet 0  . gabapentin  (NEURONTIN) 100 MG capsule Take 1 capsule (100 mg total) by mouth 3 (three) times daily. 90 capsule 0  . lisinopril (PRINIVIL,ZESTRIL) 20 MG tablet Take 0.5 tablets (10 mg total) by mouth daily. 90 tablet 3  . omeprazole (PRILOSEC) 20 MG capsule Take 1 capsule (20 mg total) by mouth daily. 30 capsule 11  . Pyridoxine HCl (VITAMIN B-6 PO) Take 1 tablet by mouth daily.    . vitamin B-12 (CYANOCOBALAMIN) 100 MCG tablet Take 100 mcg by mouth daily.    . vitamin E 200 UNIT capsule Take 200 Units by mouth daily.    [name] albuterol (PROVENTIL HFA;VENTOLIN HFA) 108 (90 BASE) MCG/ACT inhaler Inhale 2 puffs into the lungs every 6 (six) hours as needed for wheezing or shortness of breath. (Patient not taking: Reported on 08/19/2014) 1 Inhaler 3  . clotrimazole-betamethasone (LOTRISONE) cream Apply 1 application topically 2 (two) times daily. 30 g 0  . mometasone-formoterol (DULERA) 200-5 MCG/ACT AERO Inhale 2 puffs into the lungs 2 (two) times daily. (Patient not taking: Reported on 06/18/2014) 13 g 3  . nortriptyline (PAMELOR) 25 MG capsule Take 1 capsule (25 mg total) by mouth at bedtime. (Patient not taking: Reported on 06/18/2014) 30 capsule 1  . oxyCODONE-acetaminophen (PERCOCET) 5-325 MG per tablet Take 2 tablets by mouth every 6 (six) hours as needed for moderate pain. (Patient not taking: Reported on 06/18/2014) 12 tablet 0  . Rivaroxaban (XARELTO STARTER PACK) 15 & 20 MG TBPK '15mg'$  tablet by mouth twice a day with food change to 20 mg tab once daily on day 22. (Patient not taking: Reported on 06/18/2014) 51 each 0  . Spacer/Aero-Holding Chambers (BREATHERITE COLL SPACER ADULT) MISC 1 Device by Does not apply route as needed (with inhaler). 1 each 0  . tiotropium (SPIRIVA) 18 MCG inhalation capsule Place 1 capsule (18 mcg total) into inhaler and inhale daily. 30 capsule 3    Objective: BP 103/63 mmHg  Pulse 82  Temp(Src) 97.9 F (36.6 C)  (Rectal)  Resp 21  Ht 6' (1.829 m)  Wt 255 lb (115.667 kg)  BMI 34.58 kg/m2  SpO2 98%  Exam: Gen: NAD. AAOx3, cooperative with exam HEENT: Atraumatic, EOMI, PERRLA, Oropharynx clear. MMM CV: RRR. No murmurs. 2+ radial.  DP pulses hard to palpate bilaterally. Resp: CTAB. No wheezing, crackles, or rhonchi noted. Abd: +BS. Soft, non-distended, non-tender. No rebound or guarding.  Ext: 2+ pitting edema. Neuro: AAOx3, CN II-XII intact, 4/5 strength (shoulder, elbow and grip), decreased sensation and relatively strong biceps reflex in left arm. Rt arm and LE exams WNL.    Labs and Imaging: CBC BMET   Last Labs      Recent Labs Lab 08/19/14 1823 08/19/14 1830  WBC 10.9* --   HGB 16.9 18.0*  HCT 50.4 53.0*  PLT 166 --       Last Labs      Recent Labs Lab 08/19/14 1823 08/19/14 1830  NA 141 141  K 4.0 3.9  CL 105 105  CO2 23 --   BUN 11 14  CREATININE 1.69* 1.60*  GLUCOSE 117* 121*  CALCIUM 10.3 --        Dg Chest 2 View  08/01/2014   CLINICAL DATA:  Left arm weakness and numbness. Productive cough for 3 weeks.  EXAM: CHEST  2 VIEW  COMPARISON:  06/23/2014 and 06/18/2014  FINDINGS: Heart size and pulmonary vascularity are normal. The lungs are clear. There is a hiatal hernia. The CT scan of 12/12/2012 demonstrated that the Nissen fundoplication is herniated above the diaphragm. The wrap was intact at that time.  No acute osseous abnormality.  IMPRESSION: No acute abnormality.   Electronically Signed   By: Lorriane Shire M.D.   On: 08/01/2014 16:36   Ct Head Wo Contrast  08/26/2014   CLINICAL DATA:  Headaches, LEFT arm weakness, slurred speech and drooling, onset of symptoms at 1400 hours today per patient, history recent stroke, hypertension, hyperlipidemia, coronary artery disease post MI, CHF, COPD, asthma, smoker  EXAM: CT HEAD WITHOUT CONTRAST  TECHNIQUE: Contiguous axial images were obtained from the base of the skull  through the vertex without intravenous contrast.  COMPARISON:  08/19/2014 CT head and MR brain exams  FINDINGS: Generalized atrophy.  Normal ventricular morphology.  No midline shift or mass effect.  Mild small vessel chronic ischemic changes of deep cerebral white matter.  No intracranial hemorrhage, mass lesion or evidence acute infarction.  No extra-axial fluid collections.  Tiny cortical infarct identified at high posterior RIGHT frontal lobe on recent MR is not definitely visualized by CT.  Mucosal thickening and opacification at LEFT maxillary sinus with postsurgical changes from LEFT orbital reconstruction unchanged.  No acute osseous findings.  IMPRESSION: Atrophy with mild small vessel chronic ischemic changes of deep cerebral white matter.  No acute intracranial abnormalities.   Electronically Signed   By: Lavonia Dana M.D.   On: 08/26/2014 18:24   Ct Head Wo Contrast  08/19/2014   CLINICAL DATA:  64 year old male with high blood pressure and left-sided numbness and weakness  EXAM: CT HEAD WITHOUT CONTRAST  TECHNIQUE: Contiguous axial images were obtained from the base of the skull through the vertex without intravenous contrast.  COMPARISON:  MRI dated 08/02/2014  FINDINGS: There is mild dilatation of the ventricles and sulci compatible with age-related atrophy. Periventricular and deep white matter hypodensities represent chronic microvascular ischemic changes. There is no intracranial hemorrhage. No mass effect or midline shift identified. An area of heterogeneous hypodensity is noted in the right occipital lobe (series 2 image 13- 15). This may be artifactual. Acute ischemia is not excluded. MRI may provide better evaluation if there is high clinical suspicion for acute infarct.  There are postsurgical changes of left orbital and maxillary fixation with mesh. There is partial opacification of the left maxillary sinus. The remainder of the visualized paranasal sinuses and mastoid air cells are clear.  The calvarium is intact.  IMPRESSION: No acute intracranial pathology.  Focal ill-defined hypodensity in the right occipital lobe, possibly artifactual. Acute ischemia is not excluded. MRI may provide better evaluation.  Moderate age-related atrophy and chronic microvascular ischemic disease.  Critical Value/emergent results were called by telephone at the time of interpretation on 08/19/2014 at 6:41 pm to Dr. Lacretia Leigh , who verbally  acknowledged these results.   Electronically Signed   By: Anner Crete M.D.   On: 08/19/2014 18:44   Ct Head (brain) Wo Contrast  08/01/2014   CLINICAL DATA:  Left arm weakness starting last night  EXAM: CT HEAD WITHOUT CONTRAST  TECHNIQUE: Contiguous axial images were obtained from the base of the skull through the vertex without intravenous contrast.  COMPARISON:  04/10/2013  FINDINGS: No skull fracture is noted. Paranasal sinuses shows mucosal thickening left maxillary sinus. Stable postsurgical changes left orbital floor and medial orbit.  Mild atherosclerotic calcifications of carotid siphon.  No intracranial hemorrhage, mass effect or midline shift. Stable cerebral atrophy. Stable mild subcortical chronic white matter disease. No definite evidence of acute cortical infarction. No mass lesion is noted on this unenhanced scan. Ventricular size is stable from prior exam.  IMPRESSION: No acute intracranial abnormality. Stable chronic changes as described above.   Electronically Signed   By: Lahoma Crocker M.D.   On: 08/01/2014 16:22   Mr Jodene Nam Head Wo Contrast  08/02/2014   CLINICAL DATA:  New onset left-sided weakness.  EXAM: MRA HEAD WITHOUT CONTRAST  TECHNIQUE: Angiographic images of the Circle of Willis were obtained using MRA technique without intravenous contrast.  COMPARISON:  08/01/2014  FINDINGS: Motion degraded study which could obscure findings, especially in the medium and small size arteries.  Symmetric vertebral arteries. There is early branching of the left  PICA, near the dural penetration. Symmetric carotid arteries. A right posterior communicating and anterior communicating artery is present.  The right cervical ICA is mildly narrowed and irregular in luminal contour compared to the left. No intramural hematoma seen on prior conventional MRI. There is mural calcification medially at this level, consistent with atherosclerotic plaque.  There is a focal high-grade stenosis of the right paraclinoid ICA with flow gap. Symmetric flow is present within the downstream vessels. There is also a moderate proximal stenosis involving the right A1 segment. Anterior communicating artery and right posterior communicating arteries may compensate for the stenoses. There is no evidence of aneurysm. No major branch occlusion noted. No posterior circulation stenosis.  IMPRESSION: 1. High-grade right paraclinoid ICA and moderate right A1 segment stenoses. These correlate with suspected watershed infarcts on yesterday's brain MRI. 2. Mild luminal irregularity and narrowing of the upper right cervical ICA. This is likely atherosclerotic (calcified plaque present on head CT), but consider neck angiography to exclude a dissection or proximal stenosis with underfilling.   Electronically Signed   By: Monte Fantasia M.D.   On: 08/02/2014 10:54   Mr Angiogram Neck W Wo Contrast  08/03/2014   CLINICAL DATA:  Stroke.  Rule out dissection.  EXAM: MRA NECK WITHOUT AND WITH CONTRAST  TECHNIQUE: Multiplanar and multiecho pulse sequences of the neck were obtained without and with intravenous contrast. Angiographic images of the neck were obtained using MRA technique without and with intravenous contrast.  CONTRAST:  20 mL MultiHance IV  COMPARISON:  MRI head 08/01/2014  FINDINGS: Aortic arch is normal. Three vessel aortic arch. Proximal great vessels widely patent  Both vertebral arteries widely patent to the basilar. Mild to moderate stenosis of the origin of the right vertebral artery with  decreased signal some which may be due to artifact.  Right carotid artery widely patent. No significant carotid stenosis or dissection. There is a critical stenosis of the supraclinoid internal carotid artery on the right as noted on the intracranial MRA.  Left carotid bifurcation widely patent. Negative for left carotid stenosis or dissection.  IMPRESSION: Critical focal stenosis of the supraclinoid internal carotid artery on the right, consistent with atherosclerotic disease  Negative for carotid or vertebral artery dissection. Mild to moderate stenosis proximal right vertebral artery.   Electronically Signed   By: Franchot Gallo M.D.   On: 08/03/2014 16:25   Mr Brain Wo Contrast  08/19/2014   CLINICAL DATA:  Recurrence of left-sided weakness with left arm paralysis. Recent infarctions 08/01/2014.  EXAM: MRI HEAD WITHOUT CONTRAST  TECHNIQUE: Multiplanar, multiecho pulse sequences of the brain and surrounding structures were obtained without intravenous contrast.  COMPARISON:  Head CT same day.  MRI 08/01/2014.  FINDINGS: There are is again demonstration of small foci of restricted diffusion in the right frontal lobe at the junction of the anterior cerebral artery and middle cerebral artery territories, as seen on 06/23. However, there is a newly seen 1.5 cm region of acute infarction affecting the right posterior frontal cortical brain and a separate area of involvement of an adjacent gyrus anterior to that. This could be due to new embolic disease or extension of watershed infarction.  Elsewhere, there are extensive chronic small vessel ischemic changes throughout the hemispheric white matter. Increased FLAIR signal in the sulci posteriorly could be due to increased protein content of the spinal fluid. No evidence of susceptibility artifact to suggest frank hemorrhage. 6 mm left frontal convexity meningioma is unchanged in insignificant. No evidence hydrocephalus or extra-axial fluid collection. Mucosal  inflammatory changes of the left maxillary sinus are again demonstrated.  IMPRESSION: Newly seen foci of acute infarction in the right posterior frontal cortex near the vertex. This could represent extension of watershed infarction or could be new embolic infarctions. No mass effect or hemorrhage in this location.  Extensive chronic small vessel ischemic changes elsewhere throughout the brain.  Increased FLAIR signal in the sulci posteriorly could relate to increased protein content of the CSF.   Electronically Signed   By: Nelson Chimes M.D.   On: 08/19/2014 20:07   Mr Brain Wo Contrast  08/01/2014   CLINICAL DATA:  Acute onset of left arm weakness and numbness beginning at 18 30 last evening. The patient was diaphoretic and hypotensive at the time.  EXAM: MRI HEAD WITHOUT CONTRAST  TECHNIQUE: Multiplanar, multiecho pulse sequences of the brain and surrounding structures were obtained without intravenous contrast.  COMPARISON:  CT head without contrast from the same day. MRI brain 04/11/2013.  FINDINGS: At least 5 different punctate foci of restricted diffusion are present within the right frontal lobe along a watershed distribution between the [facility] and MCA territories.  Moderate generalized atrophy is present. Moderate periventricular and scattered subcortical T2 hyperintensities are present bilaterally. No acute hemorrhage is present. A 7 mm dural-based lesion over the left frontal convexity likely represents a small meningioma.  Flow is present in the major intracranial arteries. A right lens replacement is noted. The globes and orbits are otherwise intact. Chronic left ethmoid and maxillary sinus opacification is noted. The paranasal sinuses are otherwise clear. The mastoid air cells are clear.  Skullbase is unremarkable. Midline structures demonstrate fusion across the C2-3 disc space. Intracranial structures are within normal limits.  IMPRESSION: 1. At least 5 punctate nonhemorrhagic infarcts over the right  frontal lobe within a watershed distribution between the [place] and MCA territories. This corresponds with the hypotensive episode and left arm symptoms. 2. Moderate atrophy and diffuse white matter disease is advanced for age. This likely reflects the sequela of chronic microvascular ischemia. 3. 7 mm dural-based lesion over  the left frontal convexity likely represents a meningioma. 4. Chronic left ethmoid and maxillary sinus disease. These results were called by telephone at the time of interpretation on 08/01/2014 at 9:09 pm to Dr. Roderic Palau , who verbally acknowledged these results.   Electronically Signed   By: San Morelle M.D.   On: 08/01/2014 21:09   Dg Chest Portable 1 View  08/19/2014   CLINICAL DATA:  Shortness of breath. Recent stroke. Cough. History of COPD and emphysema.  EXAM: PORTABLE CHEST - 1 VIEW  COMPARISON:  08/01/2014  FINDINGS: Mild cardiac enlargement without significant vascular congestion. Linear atelectasis in the left lung base. No focal airspace disease. No blunting of costophrenic angles. No pneumothorax. Mediastinal contours appear intact. Degenerative changes in the spine.  IMPRESSION: Mild cardiac enlargement. Linear atelectasis in the left lung base. No evidence of active pulmonary disease.   Electronically Signed   By: Lucienne Capers M.D.   On: 08/19/2014 21:30      Beverlyn Roux, MD, MPH [facility] PGY-3 08/26/2014 8:49 PM [facility] pager: [phone], text pages welcome

## 2014-08-26 NOTE — Consult Note (Signed)
Referring Physician: Dr Betsey Holiday    Chief Complaint: worsening of residual left hemiparesis and dysarthria  HPI:                                                                                                                                         [name] is an 64 y.o. male with a past medical history that is relevant for HTN, hyperlipidemia, DM type 2, stroke with residual, CAD, MI, chronic congestive heart failiure , alcohol abuse, drug abuse with cocaine positive on admit 7/10 ,recent stroke 07/31/2014 and 08/20/14 (R frontal infarcts, felt to be watershed in setting of hypotension though could not rule out embolic source right supraclinoid ICA stenosis),  DVT/PE, poor compliance with anticoagulation and antiplatelet therapy, COPD,  brought to [facility] for further evaluation of the above stated symptoms. He indicated that he woke up this morning with a terrible HA and later on developed worsening of left sided arm>leg weakness as well significant change in his speech. No associated vertigo, double vision, difficulty swallowing, confusion, or visual disturbances. Stated that he can not barely use his left hand to eat. CT brain performed today was personally reviewed and showed no acute intracranial abnormality. Patient is on plavix.  Date last known well: unable to determine  Time last known well: unable to determine  tPA Given: no, late presentation   Past Medical History  Diagnosis Date  . Depression   . Hypertension   . Hyperlipidemia   . Myocardial infarction 2000  . CHF (congestive heart failure)   . COPD (chronic obstructive pulmonary disease)   . Peripheral vascular disease, unspecified     08/20/10 doppler: increase in right ABI post-op. Left ABI stable. S/P bi-fem bypass surgery  . GERD (gastroesophageal reflux disease)   . Asthma   . Anxiety   . History of DVT of lower extremity   . CAD (coronary artery disease) 05/27/10    Cath: severe single vessell CAD left cx  midportion obtuse marginal 2 to 3.  . Shortness of breath   . Pneumonia 04/04/2012  . Arthritis   . OSA on CPAP   . Hyperlipemia   . COPD (chronic obstructive pulmonary disease)   . Alcohol abuse     H/O  . Tobacco abuse   . Orbital fracture 12/2012  . Stroke     Past Surgical History  Procedure Laterality Date  . Femoral bypass  08/19/10    Right Fem-Pop  . Aortogram w/ ptca  09/292003  . Eye surgery    . Back surgery    . Total knee arthroplasty    . Cholecystectomy    . Cardiac catheterization  05/27/10    severe CAD left cx  . Abdoninal ao angio & bifem angio  05/27/10    Patent graft, occluded bil stents with no retrograde flow into the hypogastric arteries. 100% occl left ant. tibial artery,  70% to 80% to 100% stenosis right superficial fem artery above adductor canal. 100 % occl right ant. tibial vessell  . Esophagogastric fundoplication      Family History  Problem Relation Age of Onset  . Heart attack Mother   . Cirrhosis Father   . Heart failure Mother   . Heart failure Brother   . Cancer Brother    Social History:  reports that he has been smoking Cigars and Cigarettes.  He started smoking about 53 years ago. He has a 50 pack-year smoking history. He has quit using smokeless tobacco. His smokeless tobacco use included Chew. He reports that he drinks alcohol. He reports that he does not use illicit drugs.  Allergies:  Allergies  Allergen Reactions  . Darvocet [Propoxyphene N-Acetaminophen] Hives  . Haldol [Haloperidol Decanoate] Hives    Medications:                                                                                                                           I have reviewed the patient's current medications.  ROS:                                                                                                                                       History obtained from chart review and the patient  General ROS: negative for - chills, fatigue,  fever, night sweats, weight gain or weight loss Psychological ROS: negative for - behavioral disorder, hallucinations, memory difficulties, mood swings or suicidal ideation Ophthalmic ROS: negative for - blurry vision, double vision, eye pain or loss of vision ENT ROS: negative for - epistaxis, nasal discharge, oral lesions, sore throat, tinnitus or vertigo Allergy and Immunology ROS: negative for - hives or itchy/watery eyes Hematological and Lymphatic ROS: negative for - bleeding problems, bruising or swollen lymph nodes Endocrine ROS: negative for - galactorrhea, hair pattern changes, polydipsia/polyuria or temperature intolerance Respiratory ROS: negative for - cough, hemoptysis, shortness of breath or wheezing Cardiovascular ROS: negative for - chest pain, dyspnea on exertion, edema or irregular heartbeat Gastrointestinal ROS: negative for - abdominal pain, diarrhea, hematemesis, nausea/vomiting or stool incontinence Genito-Urinary ROS: negative for - dysuria, hematuria, incontinence or urinary frequency/urgency Musculoskeletal ROS: negative for - joint swelling Neurological ROS: as noted in HPI Dermatological ROS: negative for rash and skin lesion changes    Physical exam: pleasant obese unkempt male in no  apparent distress. Blood pressure 119/60, pulse 77, temperature 98.3 F (36.8 C), temperature source Oral, resp. rate 21, height 6' (1.829 m), weight 110.451 kg (243 lb 8 oz), SpO2 93 %. Head: normocephalic. Neck: supple, no bruits, no JVD. Cardiac: no murmurs. Lungs: clear. Abdomen: soft, no tender, no mass. Extremities: no edema. Skin: no rash  Neurologic Examination:                                                                                                      General: Mental Status: Alert, oriented, thought content appropriate.  Speech fluent without evidence of aphasia.  Able to follow 3 step commands without difficulty. Cranial Nerves: II: Discs flat bilaterally;  Visual fields grossly normal, pupils equal, round, reactive to light and accommodation III,IV, VI: ptosis not present, extra-ocular motions intact bilaterally V,VII: smile symmetric, facial light touch sensation normal bilaterally VIII: hearing normal bilaterally IX,X: uvula rises symmetrically XI: bilateral shoulder shrug XII: midline tongue extension without atrophy or fasciculations Motor: Significant for mild left arm>leg weakness Tone and bulk:normal Sensory: Pinprick and light touch impaired in the left side. Deep Tendon Reflexes:  1+ all over Plantars: Right: downgoing   Left: downgoing Cerebellar: normal finger-to-nose,  normal heel-to-shin test Gait:  No tested due to multiple leads, safety reasons.    Results for orders placed or performed during the hospital encounter of 08/26/14 (from the past 48 hour(s))  Protime-INR     Status: None   Collection Time: 08/26/14  5:30 PM  Result Value Ref Range   Prothrombin Time 13.5 11.6 - 15.2 seconds   INR 1.01 0.00 - 1.49  APTT     Status: None   Collection Time: 08/26/14  5:30 PM  Result Value Ref Range   aPTT 27 24 - 37 seconds  CBC     Status: None   Collection Time: 08/26/14  5:30 PM  Result Value Ref Range   WBC 7.7 4.0 - 10.5 K/uL   RBC 5.08 4.22 - 5.81 MIL/uL   Hemoglobin 15.7 13.0 - 17.0 g/dL   HCT 46.2 39.0 - 52.0 %   MCV 90.9 78.0 - 100.0 fL   MCH 30.9 26.0 - 34.0 pg   MCHC 34.0 30.0 - 36.0 g/dL   RDW 13.8 11.5 - 15.5 %   Platelets 175 150 - 400 K/uL  Differential     Status: None   Collection Time: 08/26/14  5:30 PM  Result Value Ref Range   Neutrophils Relative % 52 43 - 77 %   Neutro Abs 4.1 1.7 - 7.7 K/uL   Lymphocytes Relative 31 12 - 46 %   Lymphs Abs 2.3 0.7 - 4.0 K/uL   Monocytes Relative 12 3 - 12 %   Monocytes Absolute 0.9 0.1 - 1.0 K/uL   Eosinophils Relative 4 0 - 5 %   Eosinophils Absolute 0.3 0.0 - 0.7 K/uL   Basophils Relative 1 0 - 1 %   Basophils Absolute 0.1 0.0 - 0.1 K/uL   Comprehensive metabolic panel     Status: Abnormal   Collection Time: 08/26/14  5:30 PM  Result Value Ref Range   Sodium 135 135 - 145 mmol/L   Potassium 4.0 3.5 - 5.1 mmol/L   Chloride 100 (L) 101 - 111 mmol/L   CO2 23 22 - 32 mmol/L   Glucose, Bld 133 (H) 65 - 99 mg/dL   BUN 16 6 - 20 mg/dL   Creatinine, Ser 1.36 (H) 0.61 - 1.24 mg/dL   Calcium 9.6 8.9 - 10.3 mg/dL   Total Protein 7.4 6.5 - 8.1 g/dL   Albumin 4.0 3.5 - 5.0 g/dL   AST 29 15 - 41 U/L   ALT 26 17 - 63 U/L   Alkaline Phosphatase 69 38 - 126 U/L   Total Bilirubin 0.7 0.3 - 1.2 mg/dL   GFR calc non Af Amer 53 (L) >60 mL/min   GFR calc Af Amer >60 >60 mL/min    Comment: (NOTE) The eGFR has been calculated using the CKD EPI equation. This calculation has not been validated in all clinical situations. eGFR's persistently <60 mL/min signify possible Chronic Kidney Disease.    Anion gap 12 5 - 15  I-stat troponin, ED (not at [facility], [facility])     Status: None   Collection Time: 08/26/14  5:34 PM  Result Value Ref Range   Troponin i, poc 0.00 0.00 - 0.08 ng/mL   Comment 3            Comment: Due to the release kinetics of cTnI, a negative result within the first hours of the onset of symptoms does not rule out myocardial infarction with certainty. If myocardial infarction is still suspected, repeat the test at appropriate intervals.   I-Stat Chem 8, ED  (not at [facility], [facility])     Status: Abnormal   Collection Time: 08/26/14  5:36 PM  Result Value Ref Range   Sodium 137 135 - 145 mmol/L   Potassium 3.8 3.5 - 5.1 mmol/L   Chloride 101 101 - 111 mmol/L   BUN 19 6 - 20 mg/dL   Creatinine, Ser 1.30 (H) 0.61 - 1.24 mg/dL   Glucose, Bld 134 (H) 65 - 99 mg/dL   Calcium, Ion 1.13 1.13 - 1.30 mmol/L   TCO2 20 0 - 100 mmol/L   Hemoglobin 17.0 13.0 - 17.0 g/dL   HCT 50.0 39.0 - 52.0 %  CBG monitoring, ED     Status: Abnormal   Collection Time: 08/26/14  5:53 PM  Result Value Ref Range   Glucose-Capillary 168 (H) 65 - 99 mg/dL    Ct Head Wo Contrast  08/26/2014   CLINICAL DATA:  Headaches, LEFT arm weakness, slurred speech and drooling, onset of symptoms at 1400 hours today per patient, history recent stroke, hypertension, hyperlipidemia, coronary artery disease post MI, CHF, COPD, asthma, smoker  EXAM: CT HEAD WITHOUT CONTRAST  TECHNIQUE: Contiguous axial images were obtained from the base of the skull through the vertex without intravenous contrast.  COMPARISON:  08/19/2014 CT head and MR brain exams  FINDINGS: Generalized atrophy.  Normal ventricular morphology.  No midline shift or mass effect.  Mild small vessel chronic ischemic changes of deep cerebral white matter.  No intracranial hemorrhage, mass lesion or evidence acute infarction.  No extra-axial fluid collections.  Tiny cortical infarct identified at high posterior RIGHT frontal lobe on recent MR is not definitely visualized by CT.  Mucosal thickening and opacification at LEFT maxillary sinus with postsurgical changes from LEFT orbital reconstruction unchanged.  No acute osseous findings.  IMPRESSION: Atrophy with mild small vessel  chronic ischemic changes of deep cerebral white matter.  No acute intracranial abnormalities.   Electronically Signed   By: Lavonia Dana M.D.   On: 08/26/2014 18:24    Assessment: 64 y.o. male well known to the neurology service, with multiple risk factors for stroke including right cortical infarct involving right MCA distribution on 08/07/14, comes in with worsening of residual left sided weakness arm>leg and dysarthria. CT brain without acute abnormality. Suspect new infarcts right brain verus extension of recent strokes. Patient is poor complaint with Xarelto (no taking it at this moment) and antiplatelet therapy, and he states that this is due to passing blood prior to his last admission, no recent GI bleeding since then. He has significant intracranial atherosclerotic stenotic disease involving r supraclinoid ICA and dual antiplatelet  therapy x 3 months with change to plavix alone after 3 months was recommended at the time of his stroke this past June and July but he is not taking aspirin either. Recommend MRI brain and admission to medicine. Add aspirin to plavix as previously recommended by stroke attending. Reassess non adherence to treatment. Stroke team will resume care tomorrow.  Dorian Pod, MD [facility] [phone]  08/26/2014, 8:27 PM

## 2014-08-26 NOTE — ED Notes (Signed)
Pt reports waking up this am with stroke symptoms that have gotten progressively worse throughout the day. Having left side weakness, facial droop, slurred speech and drooling/difficulty swallowing. Hx of recent stroke.

## 2014-08-26 NOTE — ED Notes (Signed)
Dr. Pollina at bedside   

## 2014-08-26 NOTE — Progress Notes (Signed)
Pt admitted from the ED with stroke like symptoms, pt alert and oriented, tele monitor put on pt, call light within pt's reach, will however continue to monitor. Obasogie-Asidi, [name] Righi Efe

## 2014-08-26 NOTE — ED Provider Notes (Signed)
CSN: [id number]     Arrival date & time 08/26/14  1659 History   First MD Initiated Contact with Patient 08/26/14 1739     Chief Complaint  Patient presents with  . Stroke Symptoms     (Consider location/radiation/quality/duration/timing/severity/associated sxs/prior Treatment) HPI Comments: Patient presents to the emergency by her for evaluation of stroke symptoms. Patient reports that he awakened late this morning and found that he had a headache when he woke up. He reports mostly around his right eye, but diffusely aching around the entire head. He also noticed that his left arm was more numb and weak than usual. He reports that he had a stroke one week ago, the symptoms today are worse. They were present upon awakening. Over the course of the day he has noticed that his numbness and weakness is worsening. He also has now noticed the left side of his face is numb and he has been drooping and drooling.   Past Medical History  Diagnosis Date  . Depression   . Hypertension   . Hyperlipidemia   . Myocardial infarction 2000  . CHF (congestive heart failure)   . COPD (chronic obstructive pulmonary disease)   . Peripheral vascular disease, unspecified     08/20/10 doppler: increase in right ABI post-op. Left ABI stable. S/P bi-fem bypass surgery  . GERD (gastroesophageal reflux disease)   . Asthma   . Anxiety   . History of DVT of lower extremity   . CAD (coronary artery disease) 05/27/10    Cath: severe single vessell CAD left cx midportion obtuse marginal 2 to 3.  . Shortness of breath   . Pneumonia 04/04/2012  . Arthritis   . OSA on CPAP   . Hyperlipemia   . COPD (chronic obstructive pulmonary disease)   . Alcohol abuse     H/O  . Tobacco abuse   . Orbital fracture 12/2012  . Stroke    Past Surgical History  Procedure Laterality Date  . Femoral bypass  08/19/10    Right Fem-Pop  . Aortogram w/ ptca  09/292003  . Eye surgery    . Back surgery    . Total knee arthroplasty     . Cholecystectomy    . Cardiac catheterization  05/27/10    severe CAD left cx  . Abdoninal ao angio & bifem angio  05/27/10    Patent graft, occluded bil stents with no retrograde flow into the hypogastric arteries. 100% occl left ant. tibial artery, 70% to 80% to 100% stenosis right superficial fem artery above adductor canal. 100 % occl right ant. tibial vessell  . Esophagogastric fundoplication     Family History  Problem Relation Age of Onset  . Heart attack Mother   . Cirrhosis Father   . Heart failure Mother   . Heart failure Brother   . Cancer Brother    History  Substance Use Topics  . Smoking status: Current Every Day Smoker -- 1.00 packs/day for 50 years    Types: Cigars, Cigarettes    Start date: 02/09/1961  . Smokeless tobacco: Former Systems developer    Types: Chew     Comment: 2 ppd, full flavor  . Alcohol Use: Yes     Comment: occ    Review of Systems  Neurological: Positive for weakness, numbness and headaches.  All other systems reviewed and are negative.     Allergies  Darvocet and Haldol  Home Medications   Prior to Admission medications   Medication Sig Start Date  End Date Taking? Authorizing Provider  albuterol (PROVENTIL HFA;VENTOLIN HFA) 108 (90 BASE) MCG/ACT inhaler Inhale 2 puffs into the lungs every 6 (six) hours as needed for wheezing or shortness of breath. 11/21/13  Yes Coral Spikes, MD  atorvastatin (LIPITOR) 40 MG tablet Take 1 tablet (40 mg total) by mouth daily at 6 PM. 08/03/14  Yes Archie Patten, MD  clopidogrel (PLAVIX) 75 MG tablet Take 1 tablet (75 mg total) by mouth daily. 08/20/14  Yes Vivi Barrack, MD  clotrimazole-betamethasone (LOTRISONE) cream Apply 1 application topically 2 (two) times daily. 02/21/14  Yes Timmothy Euler, MD  FOLIC ACID PO Take 1 tablet by mouth daily.   Yes Historical Provider, MD  gabapentin (NEURONTIN) 100 MG capsule Take 1 capsule (100 mg total) by mouth 3 (three) times daily. 08/03/14  Yes Archie Patten, MD   omeprazole (PRILOSEC) 20 MG capsule Take 1 capsule (20 mg total) by mouth daily. 02/21/14  Yes Timmothy Euler, MD  Pyridoxine HCl (VITAMIN B-6 PO) Take 1 tablet by mouth daily.   Yes Historical Provider, MD  tiotropium (SPIRIVA) 18 MCG inhalation capsule Place 1 capsule (18 mcg total) into inhaler and inhale daily. 11/21/13  Yes Coral Spikes, MD  vitamin B-12 (CYANOCOBALAMIN) 100 MCG tablet Take 100 mcg by mouth daily.   Yes Historical Provider, MD  vitamin E 200 UNIT capsule Take 200 Units by mouth daily.   Yes Historical Provider, MD  albuterol (PROVENTIL) (2.5 MG/3ML) 0.083% nebulizer solution Take 2.5 mg by nebulization 2 (two) times daily as needed for wheezing or shortness of breath.    Historical Provider, MD  aspirin EC 325 MG EC tablet Take 1 tablet (325 mg total) by mouth daily. Patient not taking: Reported on 08/26/2014 08/20/14   Vivi Barrack, MD  mometasone-formoterol [facility]) 200-5 MCG/ACT AERO Inhale 2 puffs into the lungs 2 (two) times daily. Patient not taking: Reported on 06/18/2014 11/21/13   Coral Spikes, MD  nortriptyline (PAMELOR) 25 MG capsule Take 1 capsule (25 mg total) by mouth at bedtime. Patient not taking: Reported on 06/18/2014 08/03/13   Zenia Resides, MD  oxyCODONE-acetaminophen (PERCOCET) 5-325 MG per tablet Take 2 tablets by mouth every 6 (six) hours as needed for moderate pain. Patient not taking: Reported on 06/18/2014 06/05/13   Wandra Arthurs, MD  Spacer/Aero-Holding Chambers (BREATHERITE COLL SPACER ADULT) MISC 1 Device by Does not apply route as needed (with inhaler). 07/26/13   Timmothy Euler, MD   BP 117/54 mmHg  Pulse 85  Temp(Src) 98.3 F (36.8 C) (Oral)  Resp 22  Ht 6' (1.829 m)  Wt 243 lb 8 oz (110.451 kg)  BMI 33.02 kg/m2  SpO2 95% Physical Exam  Constitutional: He is oriented to person, place, and time. He appears well-developed and well-nourished. No distress.  HENT:  Head: Normocephalic and atraumatic.  Right Ear: Hearing normal.  Left  Ear: Hearing normal.  Nose: Nose normal.  Mouth/Throat: Oropharynx is clear and moist and mucous membranes are normal.  Eyes: Conjunctivae and EOM are normal. Pupils are equal, round, and reactive to light.  Neck: Normal range of motion. Neck supple.  Cardiovascular: Regular rhythm, S1 normal and S2 normal.  Exam reveals no gallop and no friction rub.   No murmur heard. Pulmonary/Chest: Effort normal and breath sounds normal. No respiratory distress. He exhibits no tenderness.  Abdominal: Soft. Normal appearance and bowel sounds are normal. There is no hepatosplenomegaly. There is no tenderness. There is no  rebound, no guarding, no tenderness at McBurney's point and negative Murphy's sign. No hernia.  Musculoskeletal: Normal range of motion.  Neurological: He is alert and oriented to person, place, and time. He has normal strength. No cranial nerve deficit or sensory deficit. Coordination normal. GCS eye subscore is 4. GCS verbal subscore is 5. GCS motor subscore is 6.  Skin: Skin is warm, dry and intact. No rash noted. No cyanosis.  Psychiatric: He has a normal mood and affect. His speech is normal and behavior is normal. Thought content normal.  Nursing note and vitals reviewed.   ED Course  Procedures (including critical care time) Labs Review Labs Reviewed  COMPREHENSIVE METABOLIC PANEL - Abnormal; Notable for the following:    Chloride 100 (*)    Glucose, Bld 133 (*)    Creatinine, Ser 1.36 (*)    GFR calc non Af Amer 53 (*)    All other components within normal limits  CBG MONITORING, ED - Abnormal; Notable for the following:    Glucose-Capillary 168 (*)    All other components within normal limits  I-STAT CHEM 8, ED - Abnormal; Notable for the following:    Creatinine, Ser 1.30 (*)    Glucose, Bld 134 (*)    All other components within normal limits  PROTIME-INR  APTT  CBC  DIFFERENTIAL  I-STAT TROPOININ, ED    Imaging Review Ct Head Wo Contrast  08/26/2014   CLINICAL  DATA:  Headaches, LEFT arm weakness, slurred speech and drooling, onset of symptoms at 1400 hours today per patient, history recent stroke, hypertension, hyperlipidemia, coronary artery disease post MI, CHF, COPD, asthma, smoker  EXAM: CT HEAD WITHOUT CONTRAST  TECHNIQUE: Contiguous axial images were obtained from the base of the skull through the vertex without intravenous contrast.  COMPARISON:  08/19/2014 CT head and MR brain exams  FINDINGS: Generalized atrophy.  Normal ventricular morphology.  No midline shift or mass effect.  Mild small vessel chronic ischemic changes of deep cerebral white matter.  No intracranial hemorrhage, mass lesion or evidence acute infarction.  No extra-axial fluid collections.  Tiny cortical infarct identified at high posterior RIGHT frontal lobe on recent MR is not definitely visualized by CT.  Mucosal thickening and opacification at LEFT maxillary sinus with postsurgical changes from LEFT orbital reconstruction unchanged.  No acute osseous findings.  IMPRESSION: Atrophy with mild small vessel chronic ischemic changes of deep cerebral white matter.  No acute intracranial abnormalities.   Electronically Signed   By: Lavonia Dana M.D.   On: 08/26/2014 18:24     EKG Interpretation None      MDM   Final diagnoses:  None  CVA  Patient who recently had a stroke with left-sided deficit awaken this morning with increased symptoms. He reports progression of the symptoms throughout the course of the day. Based on the fact that onset of symptoms cannot be determined, he is not a candidate for TPA or intervention.  Examination reveals decreased sensation of the left side of his face and his left upper extremity. He does have pronator drift and weakness of grip strength.  Screening CT scan does not show any acute changes. Discussed with Dr. Armida Sans, on call for neurology. Recommends admission. Patient will be admitted by family practice service.  Orpah Greek,  MD 08/26/14 2035

## 2014-08-27 ENCOUNTER — Inpatient Hospital Stay: Payer: Self-pay | Admitting: Internal Medicine

## 2014-08-27 ENCOUNTER — Inpatient Hospital Stay: Payer: Self-pay | Admitting: Family Medicine

## 2014-08-27 DIAGNOSIS — I340 Nonrheumatic mitral (valve) insufficiency: Secondary | ICD-10-CM | POA: Diagnosis not present

## 2014-08-27 DIAGNOSIS — F1410 Cocaine abuse, uncomplicated: Secondary | ICD-10-CM | POA: Diagnosis not present

## 2014-08-27 DIAGNOSIS — R471 Dysarthria and anarthria: Secondary | ICD-10-CM | POA: Diagnosis present

## 2014-08-27 DIAGNOSIS — I6523 Occlusion and stenosis of bilateral carotid arteries: Secondary | ICD-10-CM | POA: Diagnosis not present

## 2014-08-27 DIAGNOSIS — I2510 Atherosclerotic heart disease of native coronary artery without angina pectoris: Secondary | ICD-10-CM | POA: Diagnosis present

## 2014-08-27 DIAGNOSIS — I639 Cerebral infarction, unspecified: Secondary | ICD-10-CM

## 2014-08-27 DIAGNOSIS — F17210 Nicotine dependence, cigarettes, uncomplicated: Secondary | ICD-10-CM | POA: Diagnosis present

## 2014-08-27 DIAGNOSIS — I959 Hypotension, unspecified: Secondary | ICD-10-CM | POA: Diagnosis present

## 2014-08-27 DIAGNOSIS — Z888 Allergy status to other drugs, medicaments and biological substances status: Secondary | ICD-10-CM | POA: Diagnosis not present

## 2014-08-27 DIAGNOSIS — Z8673 Personal history of transient ischemic attack (TIA), and cerebral infarction without residual deficits: Secondary | ICD-10-CM | POA: Diagnosis not present

## 2014-08-27 DIAGNOSIS — R51 Headache: Secondary | ICD-10-CM | POA: Diagnosis present

## 2014-08-27 DIAGNOSIS — Z66 Do not resuscitate: Secondary | ICD-10-CM | POA: Diagnosis present

## 2014-08-27 DIAGNOSIS — Z79899 Other long term (current) drug therapy: Secondary | ICD-10-CM | POA: Diagnosis not present

## 2014-08-27 DIAGNOSIS — R519 Headache, unspecified: Secondary | ICD-10-CM

## 2014-08-27 DIAGNOSIS — G8194 Hemiplegia, unspecified affecting left nondominant side: Secondary | ICD-10-CM | POA: Diagnosis present

## 2014-08-27 DIAGNOSIS — Z8249 Family history of ischemic heart disease and other diseases of the circulatory system: Secondary | ICD-10-CM | POA: Diagnosis not present

## 2014-08-27 DIAGNOSIS — Z720 Tobacco use: Secondary | ICD-10-CM

## 2014-08-27 DIAGNOSIS — F17220 Nicotine dependence, chewing tobacco, uncomplicated: Secondary | ICD-10-CM | POA: Diagnosis present

## 2014-08-27 DIAGNOSIS — G4733 Obstructive sleep apnea (adult) (pediatric): Secondary | ICD-10-CM | POA: Diagnosis present

## 2014-08-27 DIAGNOSIS — Z7902 Long term (current) use of antithrombotics/antiplatelets: Secondary | ICD-10-CM | POA: Diagnosis not present

## 2014-08-27 DIAGNOSIS — E785 Hyperlipidemia, unspecified: Secondary | ICD-10-CM | POA: Diagnosis present

## 2014-08-27 DIAGNOSIS — Z9114 Patient's other noncompliance with medication regimen: Secondary | ICD-10-CM | POA: Diagnosis present

## 2014-08-27 DIAGNOSIS — J449 Chronic obstructive pulmonary disease, unspecified: Secondary | ICD-10-CM | POA: Diagnosis present

## 2014-08-27 DIAGNOSIS — I5032 Chronic diastolic (congestive) heart failure: Secondary | ICD-10-CM | POA: Diagnosis present

## 2014-08-27 DIAGNOSIS — N179 Acute kidney failure, unspecified: Secondary | ICD-10-CM | POA: Diagnosis present

## 2014-08-27 DIAGNOSIS — K219 Gastro-esophageal reflux disease without esophagitis: Secondary | ICD-10-CM | POA: Diagnosis present

## 2014-08-27 DIAGNOSIS — J45909 Unspecified asthma, uncomplicated: Secondary | ICD-10-CM | POA: Diagnosis present

## 2014-08-27 DIAGNOSIS — I739 Peripheral vascular disease, unspecified: Secondary | ICD-10-CM | POA: Diagnosis present

## 2014-08-27 DIAGNOSIS — Z86718 Personal history of other venous thrombosis and embolism: Secondary | ICD-10-CM

## 2014-08-27 DIAGNOSIS — I252 Old myocardial infarction: Secondary | ICD-10-CM | POA: Diagnosis not present

## 2014-08-27 DIAGNOSIS — I10 Essential (primary) hypertension: Secondary | ICD-10-CM | POA: Diagnosis present

## 2014-08-27 DIAGNOSIS — F1910 Other psychoactive substance abuse, uncomplicated: Secondary | ICD-10-CM | POA: Diagnosis not present

## 2014-08-27 DIAGNOSIS — Z7982 Long term (current) use of aspirin: Secondary | ICD-10-CM | POA: Diagnosis not present

## 2014-08-27 DIAGNOSIS — F329 Major depressive disorder, single episode, unspecified: Secondary | ICD-10-CM | POA: Diagnosis present

## 2014-08-27 DIAGNOSIS — Z6832 Body mass index (BMI) 32.0-32.9, adult: Secondary | ICD-10-CM | POA: Diagnosis not present

## 2014-08-27 DIAGNOSIS — E669 Obesity, unspecified: Secondary | ICD-10-CM | POA: Diagnosis present

## 2014-08-27 DIAGNOSIS — I6521 Occlusion and stenosis of right carotid artery: Secondary | ICD-10-CM

## 2014-08-27 DIAGNOSIS — F419 Anxiety disorder, unspecified: Secondary | ICD-10-CM | POA: Diagnosis present

## 2014-08-27 DIAGNOSIS — Z79891 Long term (current) use of opiate analgesic: Secondary | ICD-10-CM | POA: Diagnosis not present

## 2014-08-27 DIAGNOSIS — Z86711 Personal history of pulmonary embolism: Secondary | ICD-10-CM | POA: Diagnosis not present

## 2014-08-27 HISTORY — DX: Headache, unspecified: R51.9

## 2014-08-27 HISTORY — DX: Cerebral infarction, unspecified: I63.9

## 2014-08-27 LAB — GLUCOSE, CAPILLARY
GLUCOSE-CAPILLARY: 101 mg/dL — AB (ref 65–99)
Glucose-Capillary: 111 mg/dL — ABNORMAL HIGH (ref 65–99)
Glucose-Capillary: 120 mg/dL — ABNORMAL HIGH (ref 65–99)
Glucose-Capillary: 110 mg/dL — ABNORMAL HIGH (ref 65–99)

## 2014-08-27 LAB — RAPID URINE DRUG SCREEN, HOSP PERFORMED
AMPHETAMINES: NOT DETECTED
BENZODIAZEPINES: POSITIVE — AB
Barbiturates: NOT DETECTED
Cocaine: POSITIVE — AB
Opiates: POSITIVE — AB
Tetrahydrocannabinol: NOT DETECTED

## 2014-08-27 MED ORDER — HYDROMORPHONE HCL 1 MG/ML IJ SOLN
0.5000 mg | INTRAMUSCULAR | Status: AC
  Administered 2014-08-27: 0.5 mg via INTRAVENOUS
  Filled 2014-08-27: qty 1

## 2014-08-27 MED ORDER — HYDROMORPHONE HCL 1 MG/ML IJ SOLN
0.5000 mg | INTRAMUSCULAR | Status: DC | PRN
  Administered 2014-08-28 (×3): 0.5 mg via INTRAVENOUS
  Filled 2014-08-27 (×3): qty 1

## 2014-08-27 MED ORDER — MORPHINE SULFATE 2 MG/ML IJ SOLN
2.0000 mg | INTRAMUSCULAR | Status: DC | PRN
  Administered 2014-08-27: 2 mg via INTRAVENOUS
  Filled 2014-08-27: qty 1

## 2014-08-27 MED ORDER — MORPHINE SULFATE 2 MG/ML IJ SOLN
2.0000 mg | Freq: Once | INTRAMUSCULAR | Status: AC
  Administered 2014-08-27: 2 mg via INTRAVENOUS
  Filled 2014-08-27: qty 1

## 2014-08-27 NOTE — Progress Notes (Addendum)
STROKE TEAM PROGRESS NOTE   HISTORY [name] is an 64 y.o. male with a past medical history that is relevant for HTN, hyperlipidemia, DM type 2, stroke with residual, CAD, MI, chronic congestive heart failiure , alcohol abuse, drug abuse with cocaine positive on admit 7/10 ,recent stroke 07/31/2014 and 08/20/14 (R frontal infarcts, felt to be watershed in setting of hypotension though could not rule out embolic source right supraclinoid ICA stenosis), DVT/PE, poor compliance with anticoagulation and antiplatelet therapy, COPD, brought to [facility] for further evaluation of worsening of residual left hemiparesis and dysarthriahe above stated symptoms. He indicated that he woke up this morning with a terrible HA and later on developed worsening of left sided arm>leg weakness as well significant change in his speech. No associated vertigo, double vision, difficulty swallowing, confusion, or visual disturbances. Stated that he can not barely use his left hand to eat. CT brain performed today was personally reviewed and showed no acute intracranial abnormality. Patient is on plavix. Last known well: unable to determine. Patient was not administered TPA secondary to late presentation. He was admitted for further evaluation and treatment.   SUBJECTIVE (INTERVAL HISTORY) Patient without new complaints. He agrees to compliant with medication this time.    OBJECTIVE Temp:  [97.7 F (36.5 C)-98.3 F (36.8 C)] 97.7 F (36.5 C) (07/18 0936) Pulse Rate:  [68-96] 80 (07/18 0936) Cardiac Rhythm:  [-]  Resp:  [15-26] 20 (07/18 0936) BP: (107-157)/(48-77) 138/71 mmHg (07/18 0936) SpO2:  [92 %-100 %] 92 % (07/18 0936) Weight:  [110.224 kg (243 lb)-110.451 kg (243 lb 8 oz)] 110.224 kg (243 lb) (07/17 2239)   Recent Labs Lab 08/26/14 1753 08/26/14 2313 08/27/14 0638 08/27/14 1059  GLUCAP 168* 110* 111* 120*    Recent Labs Lab 08/26/14 1730 08/26/14 1736  NA 135 137  K 4.0 3.8  CL 100* 101  CO2  23  --   GLUCOSE 133* 134*  BUN 16 19  CREATININE 1.36* 1.30*  CALCIUM 9.6  --     Recent Labs Lab 08/26/14 1730  AST 29  ALT 26  ALKPHOS 69  BILITOT 0.7  PROT 7.4  ALBUMIN 4.0    Recent Labs Lab 08/26/14 1730 08/26/14 1736  WBC 7.7  --   NEUTROABS 4.1  --   HGB 15.7 17.0  HCT 46.2 50.0  MCV 90.9  --   PLT 175  --    No results for input(s): CKTOTAL, CKMB, CKMBINDEX, TROPONINI in the last 168 hours.  Recent Labs  08/26/14 1730  LABPROT 13.5  INR 1.01   No results for input(s): COLORURINE, LABSPEC, PHURINE, GLUCOSEU, HGBUR, BILIRUBINUR, KETONESUR, PROTEINUR, UROBILINOGEN, NITRITE, LEUKOCYTESUR in the last 72 hours.  Invalid input(s): APPERANCEUR     Component Value Date/Time   CHOL 130 08/02/2014 0445   TRIG 125 08/02/2014 0445   HDL 30* 08/02/2014 0445   CHOLHDL 4.3 08/02/2014 0445   VLDL 25 08/02/2014 0445   LDLCALC 75 08/02/2014 0445   Lab Results  Component Value Date   HGBA1C 6.4* 08/02/2014      Component Value Date/Time   LABOPIA NONE DETECTED 08/19/2014 2102   COCAINSCRNUR POSITIVE* 08/19/2014 2102   LABBENZ POSITIVE* 08/19/2014 2102   AMPHETMU NONE DETECTED 08/19/2014 2102   THCU NONE DETECTED 08/19/2014 2102   LABBARB NONE DETECTED 08/19/2014 2102    No results for input(s): ETH in the last 168 hours.  Ct Head Wo Contrast 08/26/2014   Atrophy with mild small vessel chronic ischemic changes  of deep cerebral white matter.  No acute intracranial abnormalities.    MRI head 08/19/14 Newly seen foci of acute infarction in the right posterior frontal cortex near the vertex. This could represent extension of watershed infarction or could be new embolic infarctions. No mass effect or hemorrhage in this location. Extensive chronic small vessel ischemic changes elsewhere throughout the brain. Increased FLAIR signal in the sulci posteriorly could relate to increased protein content of the CSF.  MRI head 08/01/14 1. At least 5 punctate  nonhemorrhagic infarcts over the right frontal lobe within a watershed distribution between the [place] and MCA territories. This corresponds with the hypotensive episode and left arm symptoms. 2. Moderate atrophy and diffuse white matter disease is advanced for age. This likely reflects the sequela of chronic microvascular ischemia. 3. 7 mm dural-based lesion over the left frontal convexity likely represents a meningioma. 4. Chronic left ethmoid and maxillary sinus disease.  MRI HEAD 08/27/2014  New subcentimeter acute infarct RIGHT corpus callosum. Acute on chronic patchy acute ischemia RIGHT frontal lobe.  Chronic changes including moderate to severe chronic small vessel ischemic disease.  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    MRA HEAD 08/27/2014 Moderately motion degraded examination. Overall poor flow related enhancement of the RIGHT internal carotid artery may reflect proximal high-grade stenosis. In addition, extremely slow flow versus occluded RIGHT supraclinoid internal carotid artery. Findings would be better characterized on CTA of the head and neck as clinically indicated.  Moderate stenosis LEFT para clinoid internal carotid artery. High-grade stenosis RIGHT A1 segment. Overall limited assessment due to motion.     Carotid Doppler 08/02/2014 Bilateral: 1-39% ICA stenosis. Vertebral artery flow is antegrade. Left: Proximal portion of the ECA appears occluded   2D Echo 08/02/2014 EF 50-55%, no source of embolus   PHYSICAL EXAM General: Mental Status: Alert, oriented, thought content appropriate. Speech fluent without evidence of aphasia. Cranial Nerves: II: Discs flat bilaterally; Visual fields grossly normal, pupils equal, round, reactive to light and  accommodation III,IV, VI: ptosis not present, extra-ocular motions intact bilaterally V,VII: smile symmetric, facial light touch sensation normal bilaterally VIII: hearing normal bilaterally IX,X: uvula rises symmetrically XI: bilateral shoulder shrug XII: midline tongue extension without atrophy or fasciculations Motor: Significant for mild left arm>leg weakness Tone and bulk:normal Sensory: Pinprick and light touch impaired in the left side. Deep Tendon Reflexes:  1+ all over Plantars: Right: downgoingLeft: downgoing Cerebellar: normal finger-to-nose, normal heel-to-shin test Gait:  No tested due to multiple leads, safety reasons.  ASSESSMENT/PLAN [name] is a 64 y.o. male with history of recent stroke 07/31/2014 (R frontal infarcts, felt to be watershed in setting of hypotension though could not rule out embolic source) as well as hypertension, hyperlipidemia, coronary artery disease with previous MI, COPD, congestive heart failure, alcohol abuse, tobacco abuse, history of DVT w/ poor medical compliance with anticoagulation presenting with worsening of residual left hemiparesis and dysarthria. He did not receive IV t-PA due to delay in arrival.   Stroke:  Non-dominant right corpus callosum infarcts in setting of R frontal infarcts in June, and new right frontal at early July, felt to be watershed in setting of hypotension due to R ICA supraclinoid near occlusion, though could not rule out embolic source.  Resultant  L hemiparesis and dysarthria  MRI  New  right corpus callosum infarct  MRA  Right supraclinoid ICA near occlusion  Carotid Doppler 08/02/2014 Bilateral: 1-39% ICA stenosis. Vertebral artery flow is antegrade.  2D Echo 08/02/2014 EF 50-55%, no source of embolus  Consider hypercoagulable panel / vasculitic labs to look for source of DVTs and stroke  LDL 75  HgbA1c 6.4   Heparin 5000 units sq tid for VTE  prophylaxis  DIET DYS 3 Room service appropriate?: Yes; Fluid consistency:: Thin  aspirin 325 mg orally every day and clopidogrel 75 mg orally every day prior to admission, now on aspirin 325 mg orally every day and clopidogrel 75 mg orally every day. If it is determined xarelto needed, ok to discontinue aspirin and plavix from stroke standpoint.  Ongoing aggressive stroke risk factor management  May need to consider R supraclinoid ICA stent if pt continues to have right MCA infarcts under medical treatment.  Therapy recommendations:  No PT or OT  Disposition:  Return home  Hx of DVT  Hx DVT, PVL in 02/2014 significant for RLE DVT involving the popliteal vein, treated with xarelto, noncompliant - had stopped d/t rectal bleeding prior to last admission, never resumed, though instructed. Refused colonoscopy in hospital.  As per note, his 02/2014 DVT is the second DVT  However, pt is not compliance with [facility] and stated that he had GIB in the past with [facility].   Due to noncompliance with medication, illicit drug use and GIB on AC, we do not recommend to resume Xarelto at this time. However, will recommend to repeat LE venous doppler.  Pt has been 6 months out of his last DVT  Will recommend to check hypercoagulable panel (ordered).  Will recommend to check LE venous doppler (ordered).  Hypertension  Stable Permissive hypertension (OK if <220/120) for 24-48 hours post stroke and then gradually normalized within 5-7 days. Avoid hypotension for stroke prevention, goal BP 120-150 due to high grade stenosis at right supreclinoid ICA.  Hyperlipidemia  Home meds:  LIPITOR 80, resumed in hospital  LDL 75, goal < 70  Continue statin at discharge  Illicit drug use  Positive for benzo, opiates and cocaine.   Pt denies using cocaine  Not good candidate for [facility]  Other Stroke Risk Factors  Advanced age  Cigarette smoker, advised to stop smoking  ETOH use  Obesity, Body mass index  is 32.95 kg/(m^2).   Hx stroke/TIA  Coronary artery disease - MI  Obstructive sleep apnea, on CPAP at home  Other Active Problems  AKI  PAD  CHF  OSA  COPD  Hospital day #   64 yo male with hx of HTN, HLD, CAD and MI in 2000, CHF, smoker, substance abuse, OSA, obese, COPD and recurrent DVT on Xarelto previously was admitted for recurrent embolic strokes at right MCA/ACA watershed territory. MRA showed high grade stenosis at right ICA supraclinoid section. His strokes likely related to hypotension with hypoperfusion to right MCA. Recommend to avoid hypotension, BP goal 120-150, and continue dural antiplatelet and statin. However, pt does have recurrent DVT on Xarelto, however, he is non-compliant with Xarelto, substance abuse with hx of GIB, would recommend to hold off Xarelto now, recommend to check hypercoagulable panel and LE venous doppler, which has been ordered.    Rosalin Hawking, MD PhD Stroke Neurology 08/27/2014 11:08 PM    To contact Stroke Continuity provider, please refer to [email or web address] After hours, contact General Neurology

## 2014-08-27 NOTE — Evaluation (Signed)
Clinical/Bedside Swallow Evaluation Patient Details  Name: THERMAN HUGHLETT MRN: [id number] Date of Birth: 1951-01-22  Today's Date: 08/27/2014 Time: SLP Start Time (ACUTE ONLY): 1120 SLP Stop Time (ACUTE ONLY): 1132 SLP Time Calculation (min) (ACUTE ONLY): 12 min  Past Medical History:  Past Medical History  Diagnosis Date  . Depression   . Hypertension   . Hyperlipidemia   . Myocardial infarction 2000  . CHF (congestive heart failure)   . COPD (chronic obstructive pulmonary disease)   . Peripheral vascular disease, unspecified     08/20/10 doppler: increase in right ABI post-op. Left ABI stable. S/P bi-fem bypass surgery  . GERD (gastroesophageal reflux disease)   . Asthma   . Anxiety   . History of DVT of lower extremity   . CAD (coronary artery disease) 05/27/10    Cath: severe single vessell CAD left cx midportion obtuse marginal 2 to 3.  . Shortness of breath   . Pneumonia 04/04/2012  . Arthritis   . OSA on CPAP   . Hyperlipemia   . COPD (chronic obstructive pulmonary disease)   . Alcohol abuse     H/O  . Tobacco abuse   . Orbital fracture 12/2012  . Stroke    Past Surgical History:  Past Surgical History  Procedure Laterality Date  . Femoral bypass  08/19/10    Right Fem-Pop  . Aortogram w/ ptca  09/[id number]  . Eye surgery    . Back surgery    . Total knee arthroplasty    . Cholecystectomy    . Cardiac catheterization  05/27/10    severe CAD left cx  . Abdoninal ao angio & bifem angio  05/27/10    Patent graft, occluded bil stents with no retrograde flow into the hypogastric arteries. 100% occl left ant. tibial artery, 70% to 80% to 100% stenosis right superficial fem artery above adductor canal. 100 % occl right ant. tibial vessell  . Esophagogastric fundoplication     HPI:  BENCE TRAPP is a 64 y.o. male admitted for worsening of left hemiparesis and dysarthria. MRI showed acute infarct right corpus callosum and acute on chronic patchy ischemia right  frontal lobe. PMH: HTN, hyperlipidemia, DM type 2, stroke with residual, CAD, MI, chronic congestive heart failiure , alcohol abuse, drug abuse with cocaine positive on admit 7/10, recent stroke 07/31/2014 and 08/20/14 (R frontal infarcts, felt to be watershed in setting of hypotension though could not rule out embolic source right supraclinoid ICA stenosis), DVT/PE, poor compliance with anticoagulation and antiplatelet therapy, COPD.   Assessment / Plan / Recommendation Clinical Impression  Pt denies any difficulty managing secretions today, and appears to have adequate labial seal with no evidence of anterior loss. His voice is hoarse/rough in quality, but he reports this has been has baseline for several years. Wet vocal quality was noted x1 with sips of thin liquids, but no further signs of aspiration were elicited with additional challenging. Recommend Dys 3 diet and thin liquids with brief SLP f/u to ensure tolerance.    Aspiration Risk  Mild    Diet Recommendation Dysphagia 3 (Mech soft);Thin   Medication Administration: Whole meds with puree Compensations: Slow rate;Small sips/bites    Other  Recommendations Oral Care Recommendations: Oral care BID   Follow Up Recommendations       Frequency and Duration min 2x/week  1 week   Pertinent Vitals/Pain n/a    SLP Swallow Goals     Swallow Study Prior Functional Status  Type of  Home: Apartment Available Help at Discharge: Personal care attendant    General Other Pertinent Information: HAILE BOSLER is a 64 y.o. male admitted for worsening of left hemiparesis and dysarthria. MRI showed acute infarct right corpus callosum and acute on chronic patchy ischemia right frontal lobe. PMH: HTN, hyperlipidemia, DM type 2, stroke with residual, CAD, MI, chronic congestive heart failiure , alcohol abuse, drug abuse with cocaine positive on admit 7/10, recent stroke 07/31/2014 and 08/20/14 (R frontal infarcts, felt to be watershed in setting  of hypotension though could not rule out embolic source right supraclinoid ICA stenosis), DVT/PE, poor compliance with anticoagulation and antiplatelet therapy, COPD. Type of Study: Bedside swallow evaluation Previous Swallow Assessment: none in chart Diet Prior to this Study: NPO Temperature Spikes Noted: No Respiratory Status: Room air History of Recent Intubation: No Behavior/Cognition: Alert;Cooperative;Pleasant mood Oral Cavity - Dentition: Edentulous Self-Feeding Abilities: Able to feed self Patient Positioning: Upright in bed Baseline Vocal Quality: Other (comment) (rough - says it has been his baseline for several years) Volitional Cough: Strong    Oral/Motor/Sensory Function     Ice Chips Ice chips: Not tested   Thin Liquid Thin Liquid: Impaired Presentation: Cup;Self Fed;Straw Pharyngeal  Phase Impairments: Suspected delayed Swallow;Wet Vocal Quality    Nectar Thick     Honey Thick     Puree Puree: Within functional limits Presentation: Self Fed;Spoon   Solid   GO Functional Assessment Tool Used: skilled clinical judgment Functional Limitations: Swallowing Swallow Current Status ([id number]): At least 1 percent but less than 20 percent impaired, limited or restricted Swallow Goal Status [phone]): At least 1 percent but less than 20 percent impaired, limited or restricted  Solid: Within functional limits (given dentition)        Germain Osgood, M.A. CCC-SLP [phone]  Germain Osgood 08/27/2014,1:47 PM

## 2014-08-27 NOTE — Progress Notes (Signed)
Patient is still experiencing very painful headache. Put in order for one time dose of Morphine '2mg'$ . Will monitor headache symptoms overnight.

## 2014-08-27 NOTE — Progress Notes (Signed)
Family Medicine Teaching Service Daily Progress Note Intern Pager: [phone]  Patient name: [name] Medical record number: [id number] Date of birth: 01/01/1951 Age: 64 y.o. Gender: male  Primary Care Provider: Nobie Putnam, DO Consultants: neuro Code Status: DNR/DNI  Pt Overview and Major Events to Date:  07/17: Admit to stepdown 07/18: continued weakness in LUE and HA, called cards for TEE    Assessment and Plan:  [name] is a 64 y.o. male presenting with left arm weakness and numbness suggestive for stroke vs.TIA. PMH is significant for recent hospitalizations for CVA, HTN, CAD, PAD, HFpEF, tobacco use, h/o DVT/PE last in 02/2014.  Left arm weakness/numbness with drooling, likely stroke/TIA: sudden onset. Similar presentation on recent hospitalization. Neuro exam significant for 4/5 strength (at shoulder, elbow and grip), decreased sensation on the left. No acute intracranial pathology on CT head. His imaging from recent hospitalization correlates with his presentation. Given recent hx of stroke, he is not a candidate for tPA. Neuro consulted and saw patient in ED. MRI brain: New subcentimeter acute infarct RIGHT corpus callosum. Acute on chronic patchy acute ischemia RIGHT frontal lobe.  - Admit to tele - Neuro checks q 2hrs - Appreciate neuro recs: - ASA 325 mg + Plavix 75 mg daily - Atorvastatin 80 mg daily - carotid doppler, echo and risk stratification labs done in the last month so will not repeat -f/u cards TEE (will be done on 7/20). Will look for PFO  AKI: Cr 1.30 on admission, down from 1.6 last week. B/l seems to be ~0.9.  - monitor Cr  HTN: 110s/60s on arrival. Lisinopril stopped last admission - continue to hold home lisinopril   PAD: DP not palpable, PT pulses palpable, trace edema in LE. LE arterial duplex in 2014 with severe proximal disease in right leg and mild to moderate range for proximal disease in the left..On  gabapentin 100 mg. - continue home gabapentin - consider repeat arterial duplex   CAD/MI/CHF: hx of MI in 2000 per EMR. Also single vessel cath in 2012. No chest pain or dyspnea this time. Recent 2D echo on 06/23 with EF of 50-55% with grade 1DD. A1c 6.4 on 6/23. -atorvastatin as above -ASA as above  Hx of DVT/PE: his PVL in 02/2014 significant for RLE DVT involving the popliteal vein. And superficial thrombosis of calf veins. On Xarelto in the past but poor compliance. - will continue antiplatelets for now - discuss restarting xarelto  COPD: on albuterol neb and Spiriva at home. Not on oxygen. No SOB or respiratory symptoms this time. - continue home spiriva - Duonebs q2hrs prn - O2 for SpO < 88%  Smoking: a pack a day for 30 yrs.  - Encourage quitting - nicotine patch if requested  FEN/GI:  F: SLIV E: replete as needed N: NPO pending SLP eval  GI: protonix  Prophylaxis: SQ heparin  Disposition: step down   Subjective:  Patient laying in bed in NAD. He still has the same defecits he had yesterday but drooling has improved. He stated he has not been taking ASA because he couldn't afford it.   Objective: Temp:  [97.7 F (36.5 C)-98.3 F (36.8 C)] 98.2 F (36.8 C) (07/18 0600) Pulse Rate:  [68-96] 86 (07/18 0600) Resp:  [15-26] 20 (07/18 0600) BP: (107-157)/(48-77) 136/63 mmHg (07/18 0600) SpO2:  [93 %-100 %] 96 % (07/18 0600) Weight:  [243 lb (110.224 kg)-243 lb 8 oz (110.451 kg)] 243 lb (110.224 kg) (07/17 2239)   Physical Exam: General:  NAD, alert and oriented x3 Cardiovascular: RRR, normal s1 and s2, no rubs, gallops, or murmurs Respiratory: clear to auscultation bilaterally Abdomen: +BS, soft, non distended, non tender Extremities: 2+ pitting edema Neuro: AAOx3, CN II-XII intact, LUE 4/5 strength (shoulder, elbow and grip), decreased sensation and relatively strong biceps reflex in left arm. Rt arm and LE exams WNL.   Laboratory:  Recent Labs Lab  08/26/14 1730 08/26/14 1736  WBC 7.7  --   HGB 15.7 17.0  HCT 46.2 50.0  PLT 175  --     Recent Labs Lab 08/26/14 1730 08/26/14 1736  NA 135 137  K 4.0 3.8  CL 100* 101  CO2 23  --   BUN 16 19  CREATININE 1.36* 1.30*  CALCIUM 9.6  --   PROT 7.4  --   BILITOT 0.7  --   ALKPHOS 69  --   ALT 26  --   AST 29  --   GLUCOSE 133* 134*      Imaging/Diagnostic Tests: Ct Head Wo Contrast  08/26/2014   CLINICAL DATA:  Headaches, LEFT arm weakness, slurred speech and drooling, onset of symptoms at 1400 hours today per patient, history recent stroke, hypertension, hyperlipidemia, coronary artery disease post MI, CHF, COPD, asthma, smoker  EXAM: CT HEAD WITHOUT CONTRAST  TECHNIQUE: Contiguous axial images were obtained from the base of the skull through the vertex without intravenous contrast.  COMPARISON:  08/19/2014 CT head and MR brain exams  FINDINGS: Generalized atrophy.  Normal ventricular morphology.  No midline shift or mass effect.  Mild small vessel chronic ischemic changes of deep cerebral white matter.  No intracranial hemorrhage, mass lesion or evidence acute infarction.  No extra-axial fluid collections.  Tiny cortical infarct identified at high posterior RIGHT frontal lobe on recent MR is not definitely visualized by CT.  Mucosal thickening and opacification at LEFT maxillary sinus with postsurgical changes from LEFT orbital reconstruction unchanged.  No acute osseous findings.  IMPRESSION: Atrophy with mild small vessel chronic ischemic changes of deep cerebral white matter.  No acute intracranial abnormalities.   Electronically Signed   By: Lavonia Dana M.D.   On: 08/26/2014 18:24   Mr Brain Wo Contrast  08/27/2014   CLINICAL DATA:  Severe headache beginning this morning, extremity weakness, change in speech. Assess for stroke. History of hypertension, hyperlipidemia, orbital fracture.  EXAM: MRI HEAD WITHOUT CONTRAST  MRA HEAD WITHOUT CONTRAST  TECHNIQUE: Multiplanar, multiecho  pulse sequences of the brain and surrounding structures were obtained without intravenous contrast. Angiographic images of the head were obtained using MRA technique without contrast.  COMPARISON:  CT head August 26, 2014 at 1818 hours andMRI of the head August 19, 2014 and MRA head August 02, 2014  FINDINGS: MRI HEAD FINDINGS  Subcentimeter focus of reduced diffusion RIGHT corpus callosum is new from prior imaging, corresponding low ADC value. Faint foci of reduced diffusion RIGHT frontal lobe consistent with acute on chronic infarcts, with patchy low ADC values. Stable focus of susceptibility artifact RIGHT occipital lobe, with apparent associated developmental venous anomaly compatible with cavernoma. No susceptibility artifact to suggest hemorrhage. Ventricles and sulci are normal for patient's age. Patchy to confluent supratentorial white matter T2 hyperintensities, advanced for age. No midline shift, mass effect or mass lesions. Old RIGHT caudate head lacunar infarct.  FLAIR T2 hyperintense signal within the RIGHT occipital subarachnoid space, similar to prior imaging. Status post RIGHT ocular lens implant. Circumferential LEFT maxillary sinus mucosal thickening, mastoid air cells are well aerated.  Mildly expanded fluid filled sella compatible with empty sella. No cerebellar tonsillar ectopia. No suspicious calvarial bone marrow signal. Patient is edentulous.  MRA HEAD FINDINGS  Moderately motion degraded examination.  Anterior circulation: Somewhat diminutive cervical RIGHT internal carotid artery, which appears worse than prior examination, a poor flow related enhancement of the RIGHT petrous, cavernous and supra clinoid internal carotid artery, with nonvisualized flow related enhancement of RIGHT supraclinoid internal carotid artery. Moderate stenosis LEFT para clinoid internal carotid artery. High-grade stenosis RIGHT A1 segment, at least mild stenosis proximal RIGHT A2 segment. Patent anterior communicating  artery with flow related enhancement of the anterior cerebral arteries. Flow related enhancement within the middle cerebral arteries.  Posterior circulation: Codominant vertebral arteries with normal flow related enhancement of vertebral arteries, basilar artery and main branch vessels. Small RIGHT posterior communicating artery present. Normal flow related enhancement of posterior cerebral arteries.  Limited assessment for aneurysm due to motion.  IMPRESSION: MRI HEAD: New subcentimeter acute infarct RIGHT corpus callosum. Acute on chronic patchy acute ischemia RIGHT frontal lobe.  Chronic changes including moderate to severe chronic small vessel ischemic disease.  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  MRA HEAD: Moderately motion degraded examination. Overall poor flow related enhancement of the RIGHT internal carotid artery may reflect proximal high-grade stenosis. In addition, extremely slow flow versus occluded RIGHT supraclinoid internal carotid artery. Findings would be better characterized on CTA of the head and neck as clinically indicated.  Moderate stenosis LEFT para clinoid internal carotid artery. High-grade stenosis RIGHT A1 segment. Overall limited assessment due to motion.   Electronically Signed   By: Elon Alas M.D.   On: 08/27/2014 01:08   Mr Jodene Nam Head/brain Wo Cm  08/27/2014   CLINICAL DATA:  Severe headache beginning this morning, extremity weakness, change in speech. Assess for stroke. History of hypertension, hyperlipidemia, orbital fracture.  EXAM: MRI HEAD WITHOUT CONTRAST  MRA HEAD WITHOUT CONTRAST  TECHNIQUE: Multiplanar, multiecho pulse sequences of the brain and surrounding structures were obtained without intravenous contrast. Angiographic images of  the head were obtained using MRA technique without contrast.  COMPARISON:  CT head August 26, 2014 at 1818 hours andMRI of the head August 19, 2014 and MRA head August 02, 2014  FINDINGS: MRI HEAD FINDINGS  Subcentimeter focus of reduced diffusion RIGHT corpus callosum is new from prior imaging, corresponding low ADC value. Faint foci of reduced diffusion RIGHT frontal lobe consistent with acute on chronic infarcts, with patchy low ADC values. Stable focus of susceptibility artifact RIGHT occipital lobe, with apparent associated developmental venous anomaly compatible with cavernoma. No susceptibility artifact to suggest hemorrhage. Ventricles and sulci are normal for patient's age. Patchy to confluent supratentorial white matter T2 hyperintensities, advanced for age. No midline shift, mass effect or mass lesions. Old RIGHT caudate head lacunar infarct.  FLAIR T2 hyperintense signal within the RIGHT occipital subarachnoid space, similar to prior imaging. Status post RIGHT ocular lens implant. Circumferential LEFT maxillary sinus mucosal thickening, mastoid air cells are well aerated. Mildly expanded fluid filled sella compatible with empty sella. No cerebellar tonsillar ectopia. No suspicious calvarial bone marrow signal. Patient is edentulous.  MRA HEAD FINDINGS  Moderately motion degraded examination.  Anterior circulation: Somewhat diminutive cervical RIGHT internal carotid artery, which appears worse than prior examination, a poor flow related  enhancement of the RIGHT petrous, cavernous and supra clinoid internal carotid artery, with nonvisualized flow related enhancement of RIGHT supraclinoid internal carotid artery. Moderate stenosis LEFT para clinoid internal carotid artery. High-grade stenosis RIGHT A1 segment, at least mild stenosis proximal RIGHT A2 segment. Patent anterior communicating artery with flow related enhancement of the anterior cerebral arteries. Flow related enhancement within the middle cerebral  arteries.  Posterior circulation: Codominant vertebral arteries with normal flow related enhancement of vertebral arteries, basilar artery and main branch vessels. Small RIGHT posterior communicating artery present. Normal flow related enhancement of posterior cerebral arteries.  Limited assessment for aneurysm due to motion.  IMPRESSION: MRI HEAD: New subcentimeter acute infarct RIGHT corpus callosum. Acute on chronic patchy acute ischemia RIGHT frontal lobe.  Chronic changes including moderate to severe chronic small vessel ischemic disease.  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  MRA HEAD: Moderately motion degraded examination. Overall poor flow related enhancement of the RIGHT internal carotid artery may reflect proximal high-grade stenosis. In addition, extremely slow flow versus occluded RIGHT supraclinoid internal carotid artery. Findings would be better characterized on CTA of the head and neck as clinically indicated.  Moderate stenosis LEFT para clinoid internal carotid artery. High-grade stenosis RIGHT A1 segment. Overall limited assessment due to motion.   Electronically Signed   By: Elon Alas M.D.   On: 08/27/2014 01:08     Carlyle Dolly, MD 08/27/2014, 6:35 AM PGY-1, [place] Intern pager: [phone], text pages welcome

## 2014-08-27 NOTE — Evaluation (Signed)
Physical Therapy Evaluation and Discharge Patient Details Name: [name] MRN: [id number] DOB: 14-Apr-1950 Today's Date: 08/27/2014   History of Present Illness  [name] is a 64 y.o. male presenting with left arm weakness and numbness suggestive for stroke   Clinical Impression  Pt functioning at baseline from gross mobiltiy stand point. Pt with L UE weakness however OT managing. Pt with no further acute skilled PT needs at this time. Please re-consult if needed in future. PT SIGNING OFF.    Follow Up Recommendations No PT follow up;Supervision - Intermittent (for L UE)    Equipment Recommendations  None recommended by PT    Recommendations for Other Services       Precautions / Restrictions Precautions Precautions: Fall Restrictions Weight Bearing Restrictions: No      Mobility  Bed Mobility Overal bed mobility: Modified Independent Bed Mobility: Supine to Sit;Sit to Supine     Supine to sit: Modified independent (Device/Increase time) Sit to supine: Supervision   General bed mobility comments: pt used UEs and bed rail  Transfers Overall transfer level: Modified independent Equipment used: None Transfers: Sit to/from Stand Sit to Stand: Modified independent (Device/Increase time)         General transfer comment: good use of cane  Ambulation/Gait Ambulation/Gait assistance: Modified independent (Device/Increase time) Ambulation Distance (Feet): 200 Feet Assistive device: None;Straight cane Gait Pattern/deviations: Step-through pattern   Gait velocity interpretation: at or above normal speed for age/gender General Gait Details: pt with good sequencing of cane, no episodes of LOB  Stairs            Wheelchair Mobility    Modified Rankin (Stroke Patients Only) Modified Rankin (Stroke Patients Only) Pre-Morbid Rankin Score: Slight disability Modified Rankin: Moderate disability     Balance Overall balance assessment:  (needs SPC  for safe amb) Sitting-balance support: No upper extremity supported;Feet supported Sitting balance-Leahy Scale: Normal Sitting balance - Comments: Able to reach outside BoS to donn socks, no LOB.   Standing balance support: During functional activity;No upper extremity supported Standing balance-Leahy Scale: Fair                               Pertinent Vitals/Pain Pain Assessment: 0-10 Pain Score: 6  Pain Location: Headache around R side of eye Pain Descriptors / Indicators: Aching;Throbbing Pain Intervention(s): Monitored during session    Home Living Family/patient expects to be discharged to:: Private residence Living Arrangements: Alone Available Help at Discharge: Personal care attendant Type of Home: Apartment Home Access: Elevator     Home Layout: One level Home Equipment: Cane - single point;Grab bars - tub/shower      Prior Function Level of Independence: Needs assistance   Gait / Transfers Assistance Needed: Uses SPC at all times when walking.   ADL's / Homemaking Assistance Needed: Aide assists with housework, meal prep and transportation        Hand Dominance   Dominant Hand: Right    Extremity/Trunk Assessment   Upper Extremity Assessment: Defer to OT evaluation       LUE Deficits / Details: Has movement at all joints but very weak. Unable to take any resistance. Distal weakness greater than proximal weakness. PROM intact. Patient has difficulty grasping objects, decreased fine motor coordination, and decreased sensation in digits.   Lower Extremity Assessment: Overall WFL for tasks assessed      Cervical / Trunk Assessment: Normal  Communication   Communication: [facility]  Cognition Arousal/Alertness: Awake/alert Behavior During Therapy: WFL for tasks assessed/performed Overall Cognitive Status: Within Functional Limits for tasks assessed                      General Comments      Exercises        Assessment/Plan     PT Assessment Patent does not need any further PT services  PT Diagnosis Generalized weakness   PT Problem List    PT Treatment Interventions     PT Goals (Current goals can be found in the Care Plan section) Acute Rehab PT Goals Patient Stated Goal: to go home PT Goal Formulation: All assessment and education complete, DC therapy    Frequency     Barriers to discharge        Co-evaluation               End of Session Equipment Utilized During Treatment: Gait belt Activity Tolerance: Patient tolerated treatment well Patient left: with call bell/phone within reach;in chair Nurse Communication: Mobility status         Time: 9357-0177 PT Time Calculation (min) (ACUTE ONLY): 21 min   Charges:   PT Evaluation $Initial PT Evaluation Tier I: 1 Procedure     PT G CodesKingsley Callander 08/27/2014, 2:15 PM   [name], PT, DPT Pager #: [phone] Office #: [phone]

## 2014-08-27 NOTE — Evaluation (Signed)
Occupational Therapy Evaluation Patient Details Name: [name] MRN: [id number] DOB: 1950/04/20 Today's Date: 08/27/2014    History of Present Illness [name] is a 64 y.o. male presenting with left arm weakness and numbness suggestive for stroke    Clinical Impression   Pt with decline in function due to decreased L UE strength and [place] with decreased safety and balance. Pt would benefit form acute OT services to address impairments to increase level of function and safety to return home    Follow Up Recommendations  Supervision - Intermittent;Outpatient OT    Equipment Recommendations  None recommended by OT    Recommendations for Other Services       Precautions / Restrictions Precautions Precautions: Fall Restrictions Weight Bearing Restrictions: No      Mobility Bed Mobility Overal bed mobility: Needs Assistance Bed Mobility: Supine to Sit;Sit to Supine     Supine to sit: Supervision Sit to supine: Supervision      Transfers Overall transfer level: Needs assistance Equipment used: None Transfers: Sit to/from Stand Sit to Stand: Supervision         General transfer comment: pt moves quickly    Balance   Sitting-balance support: No upper extremity supported;Feet supported Sitting balance-Leahy Scale: Normal     Standing balance support: During functional activity;No upper extremity supported Standing balance-Leahy Scale: Fair                              ADL       Grooming: Wash/dry face;Oral care;Minimal assistance;Sitting   Upper Body Bathing: Sitting;Minimal assitance   Lower Body Bathing: Minimal assistance;Sit to/from stand   Upper Body Dressing : Minimal assistance;Sitting   Lower Body Dressing: Minimal assistance;Sit to/from stand   Toilet Transfer: Supervision/safety;Regular Toilet;Grab bars;Ambulation   Toileting- Clothing Manipulation and Hygiene: Minimal assistance;Sit to/from stand   Tub/ Shower  Transfer: Tub transfer;Min guard;Ambulation;Grab bars   Functional mobility during ADLs: Supervision/safety General ADL Comments: Patient having difficulty with ADLs due to L UE weakness, decreased sensation, decreased fine motor coordination. Difficulty grasping objects. Difficulty with donning socks and opening tootthpaste tube. Patient reports his aide can assist with ADLs if needed and has been providing assistance with tub transfer at home. Educated patient on using LUE as gross assist during ADLs as much as possible, and on my recommendation for follow-up OT to improve LUE function. Patient verbalized understanding.     Vision  reading glasses, pt denies any change from baseline   Perception Perception Perception Tested?: No   Praxis Praxis Praxis tested?: Not tested    Pertinent Vitals/Pain Pain Assessment: 0-10 Pain Score: 8  Pain Location: headache, R side Pain Descriptors / Indicators: Aching;Throbbing Pain Intervention(s): Monitored during session;Premedicated before session     Hand Dominance Right   Extremity/Trunk Assessment Upper Extremity Assessment Upper Extremity Assessment: LUE deficits/detail LUE Deficits / Details: Has movement at all joints but very weak. Unable to take any resistance. Distal weakness greater than proximal weakness. PROM intact. Patient has difficulty grasping objects, decreased fine motor coordination, and decreased sensation in digits. LUE Sensation: decreased light touch LUE Coordination: decreased fine motor   Lower Extremity Assessment Lower Extremity Assessment: Defer to PT evaluation   Cervical / Trunk Assessment Cervical / Trunk Assessment: Normal   Communication Communication Communication: HOH   Cognition Arousal/Alertness: Awake/alert Behavior During Therapy: WFL for tasks assessed/performed Overall Cognitive Status: Within Functional Limits for tasks assessed  General Comments   pt pleasant and  cooperative    ]             Home Living Family/patient expects to be discharged to:: Private residence Living Arrangements: Alone Available Help at Discharge: Personal care attendant Type of Home: Apartment Home Access: Elevator     Home Layout: One level     Bathroom Shower/Tub: Tub/shower unit;Curtain Shower/tub characteristics: Architectural technologist: Handicapped height     Home Equipment: [place] - single point;Grab bars - tub/shower          Prior Functioning/Environment Level of Independence: Needs assistance  Gait / Transfers Assistance Needed: Uses SPC at all times when walking.  ADL's / Homemaking Assistance Needed: Aide assists with housework, meal prep and transportation        OT Diagnosis: Generalized weakness   OT Problem List: Decreased strength;Decreased range of motion;Impaired UE functional use;Impaired balance (sitting and/or standing)   OT Treatment/Interventions:      OT Goals(Current goals can be found in the care plan section) Acute Rehab OT Goals Patient Stated Goal: to go home OT Goal Formulation: With patient Time For Goal Achievement: 09/03/14 Potential to Achieve Goals: Good ADL Goals Pt Will Perform Grooming: with set-up;with supervision;standing Pt Will Perform Upper Body Bathing: with set-up;with supervision;standing;sitting Pt Will Perform Lower Body Bathing: with set-up;with supervision;sitting/lateral leans;sit to/from stand Pt Will Perform Upper Body Dressing: with set-up;sitting;standing Pt Will Perform Lower Body Dressing: with set-up;with supervision;sitting/lateral leans;sit to/from stand Pt Will Transfer to Toilet: with modified independence;ambulating;regular height toilet;grab bars Pt Will Perform Toileting - Clothing Manipulation and hygiene: with modified independence;sit to/from stand Pt Will Perform Tub/Shower Transfer: with modified independence;shower seat;ambulating Additional ADL Goal #1: pt will tolerate L UE  exercises with level 2 theraband to increase  for UE functional use Additional ADL Goal #2: pt will complete L hand [place] tasks to increase coordination and function for ADLs and functional tasks  OT Frequency: Min 2X/week   Barriers to D/C: Decreased caregiver support                        End of Session    Activity Tolerance: Patient tolerated treatment well Patient left: in bed;with call bell/phone within reach;with bed alarm set   Time: 0712-1975 OT Time Calculation (min): 22 min Charges:  OT General Charges $OT Visit: 1 Procedure OT Evaluation $Initial OT Evaluation Tier I: 1 Procedure OT Treatments $Therapeutic Activity: 8-22 mins G-Codes: OT G-codes **NOT FOR INPATIENT CLASS** Functional Limitation: Self care Self Care Current Status (O8325): At least 20 percent but less than 40 percent impaired, limited or restricted Self Care Goal Status (Q9826): At least 1 percent but less than 20 percent impaired, limited or restricted  Britt Bottom 08/27/2014, 1:46 PM

## 2014-08-28 ENCOUNTER — Inpatient Hospital Stay (HOSPITAL_COMMUNITY): Payer: Medicare Other

## 2014-08-28 DIAGNOSIS — I639 Cerebral infarction, unspecified: Secondary | ICD-10-CM

## 2014-08-28 DIAGNOSIS — I6523 Occlusion and stenosis of bilateral carotid arteries: Secondary | ICD-10-CM

## 2014-08-28 DIAGNOSIS — F1410 Cocaine abuse, uncomplicated: Secondary | ICD-10-CM | POA: Diagnosis present

## 2014-08-28 DIAGNOSIS — F1910 Other psychoactive substance abuse, uncomplicated: Secondary | ICD-10-CM | POA: Diagnosis present

## 2014-08-28 DIAGNOSIS — I6529 Occlusion and stenosis of unspecified carotid artery: Secondary | ICD-10-CM | POA: Diagnosis present

## 2014-08-28 DIAGNOSIS — Z86718 Personal history of other venous thrombosis and embolism: Secondary | ICD-10-CM

## 2014-08-28 DIAGNOSIS — J449 Chronic obstructive pulmonary disease, unspecified: Secondary | ICD-10-CM

## 2014-08-28 LAB — GLUCOSE, CAPILLARY
GLUCOSE-CAPILLARY: 107 mg/dL — AB (ref 65–99)
GLUCOSE-CAPILLARY: 159 mg/dL — AB (ref 65–99)
Glucose-Capillary: 168 mg/dL — ABNORMAL HIGH (ref 65–99)
Glucose-Capillary: 155 mg/dL — ABNORMAL HIGH (ref 65–99)

## 2014-08-28 LAB — ANTITHROMBIN III: AntiThromb III Func: 85 % (ref 75–120)

## 2014-08-28 NOTE — Progress Notes (Signed)
    [facility] has been requested to perform a transesophageal echocardiogram on [name] on 08/29/2014 for CVA r/o cardiac source of embolus.  After careful review of history and examination, the risks and benefits of transesophageal echocardiogram have been explained including risks of esophageal damage, perforation (1:10,000 risk), bleeding, pharyngeal hematoma as well as other potential complications associated with conscious sedation including aspiration, arrhythmia, respiratory failure and death. Alternatives to treatment were discussed, questions were answered. Patient is willing to proceed.   Murray Hodgkins, NP 08/28/2014 7:16 PM

## 2014-08-28 NOTE — Progress Notes (Signed)
Family Medicine Teaching Service Daily Progress Note Intern Pager: [phone]  Patient name: [name] Medical record number: [id number] Date of birth: 08/05/1950 Age: 64 y.o. Gender: male  Primary Care Provider: Nobie Putnam, DO Consultants: neuro Code Status: DNR/DNI  Pt Overview and Major Events to Date:  07/17: Admit to stepdown 07/18: continued weakness in LUE and HA, called cards for TEE    Assessment and Plan:  [name] is a 64 y.o. male presenting with left arm weakness and numbness suggestive for stroke vs.TIA. PMH is significant for recent hospitalizations for CVA, HTN, CAD, PAD, HFpEF, tobacco use, h/o DVT/PE last in 02/2014.  Left arm weakness/numbness with drooling, likely stroke/TIA: sudden onset. Similar presentation on recent hospitalization. Neuro exam significant for 4/5 strength (at shoulder, elbow and grip), decreased sensation on the left. No acute intracranial pathology on CT head. His imaging from recent hospitalization correlates with his presentation. Given recent hx of stroke, he is not a candidate for tPA. Neuro consulted and saw patient in ED. MRI brain: New subcentimeter acute infarct RIGHT corpus callosum. Acute on chronic patchy acute ischemia RIGHT frontal lobe.  - Admit to tele - Appreciate neuro recs: - ASA 325 mg + Plavix 75 mg daily - Atorvastatin 80 mg daily - carotid doppler, echo and risk stratification labs done in the last month so will not repeat - f/u cards TEE (will be done on 7/20). Will look for PFO - Passed SLP, PT/OT evals  Headache: right frontal headache. Throbbing. Associated with light insensitivity. 8/10 in intensity, which is unchanged since it started.  - Will consider reimaging again if pain worsens - continue morphine  Elicit drug use: UDS positive for cocaine, opiate and benzo. He wasn't supposed to on opiate or benzo per EPIC.  -Discussed about the results and health consequence with a  patient.  AKI: Cr 1.30 on admission, down from 1.6 last week. B/l seems to be ~0.9.  - monitor Cr  HTN: 110s/60s on arrival. Lisinopril stopped last admission - continue to hold home lisinopril   PAD: DP not palpable. LE arterial duplex in 2014 with severe proximal disease in right leg and mild to moderate range for proximal disease in the left..On gabapentin 100 mg. - continue home gabapentin - consider repeat arterial duplex   CAD/MI/CHF: hx of MI in 2000 per EMR. Also single vessel cath in 2012. No chest pain or dyspnea this time. Recent 2D echo on 06/23 with EF of 50-55% with grade 1DD. A1c 6.4 on 6/23. -atorvastatin as above -ASA as above  Hx of DVT/PE: his PVL in 02/2014 significant for RLE DVT involving the popliteal vein. And superficial thrombosis of calf veins. On Xarelto in the past but poor compliance. - will continue antiplatelets for now - discuss restarting xarelto  COPD: on albuterol neb and Spiriva at home. Not on oxygen. No SOB or respiratory symptoms this time. - continue home spiriva - Duonebs q2hrs prn - O2 for SpO < 88%  Smoking: a pack a day for 30 yrs.  - Encourage quitting - nicotine patch if requested   FEN/GI:  F: SLIV E: replete as needed N: NPO pending SLP eval  GI: protonix  Prophylaxis: SQ heparin  Disposition: floor.   Subjective:  Patient walking out of the bathroom. He reports right sided throbbing headache about 8/10. Reports pain is worse with light. Hardly tolerated my eye exams.  Objective: Temp:  [97.6 F (36.4 C)-98.1 F (36.7 C)] 98 F (36.7 C) (07/19 4008) Pulse  Rate:  [80-90] 82 (07/19 0605) Resp:  [19-20] 19 (07/19 0605) BP: (116-152)/(44-72) 142/56 mmHg (07/19 0605) SpO2:  [92 %-99 %] 96 % (07/19 0605)   Physical Exam: General: NAD, alert and oriented x3 Cardiovascular: RRR, normal s1 and s2, no rubs, gallops, or murmurs Respiratory: clear to auscultation bilaterally Abdomen: +BS, soft, non distended, non  tender Extremities: 2+ pitting edema Neuro: AAOx3, CN II-XII intact, LUE 4/5 strength at elbow and grip, 5/5 else wehre. Sensation intact. Knee reflexes 2+ in the left, 1+ in the right.  Laboratory:  Recent Labs Lab 08/26/14 1730 08/26/14 1736  WBC 7.7  --   HGB 15.7 17.0  HCT 46.2 50.0  PLT 175  --     Recent Labs Lab 08/26/14 1730 08/26/14 1736  NA 135 137  K 4.0 3.8  CL 100* 101  CO2 23  --   BUN 16 19  CREATININE 1.36* 1.30*  CALCIUM 9.6  --   PROT 7.4  --   BILITOT 0.7  --   ALKPHOS 69  --   ALT 26  --   AST 29  --   GLUCOSE 133* 134*    Imaging/Diagnostic Tests: No results found.  Mercy Riding, MD 08/28/2014, 7:06 AM PGY-1, [place] Intern pager: [phone], text pages welcome

## 2014-08-28 NOTE — Progress Notes (Signed)
STROKE TEAM PROGRESS NOTE   HISTORY [name] is an 64 y.o. male with a past medical history that is relevant for HTN, hyperlipidemia, DM type 2, stroke with residual, CAD, MI, chronic congestive heart failiure , alcohol abuse, drug abuse with cocaine positive on admit 7/10 ,recent stroke 07/31/2014 and 08/20/14 (R frontal infarcts, felt to be watershed in setting of hypotension though could not rule out embolic source right supraclinoid ICA stenosis), DVT/PE, poor compliance with anticoagulation and antiplatelet therapy, COPD, brought to [facility] for further evaluation of worsening of residual left hemiparesis and dysarthriahe above stated symptoms. He indicated that he woke up this morning with a terrible HA and later on developed worsening of left sided arm>leg weakness as well significant change in his speech. No associated vertigo, double vision, difficulty swallowing, confusion, or visual disturbances. Stated that he can not barely use his left hand to eat. CT brain performed today was personally reviewed and showed no acute intracranial abnormality. Patient is on plavix. Last known well: unable to determine. Patient was not administered TPA secondary to late presentation. He was admitted for further evaluation and treatment.   SUBJECTIVE (INTERVAL HISTORY) Patient up in chair. No new complaints. Dreading his upcoming TEE. DVT negative on venous doppler. Blood drawn for hypercoagulable work up.   OBJECTIVE Temp:  [97.4 F (36.3 C)-98 F (36.7 C)] 97.4 F (36.3 C) (07/19 1036) Pulse Rate:  [82-90] 90 (07/19 1036) Cardiac Rhythm:  [-] Normal sinus rhythm (07/18 2021) Resp:  [18-20] 18 (07/19 1036) BP: (128-152)/(44-71) 145/71 mmHg (07/19 1036) SpO2:  [94 %-99 %] 97 % (07/19 1036)   Recent Labs Lab 08/27/14 0638 08/27/14 1059 08/27/14 1603 08/27/14 2118 08/28/14 0604  GLUCAP 111* 120* 110* 101* 168*    Recent Labs Lab 08/26/14 1730 08/26/14 1736  NA 135 137  K 4.0 3.8   CL 100* 101  CO2 23  --   GLUCOSE 133* 134*  BUN 16 19  CREATININE 1.36* 1.30*  CALCIUM 9.6  --     Recent Labs Lab 08/26/14 1730  AST 29  ALT 26  ALKPHOS 69  BILITOT 0.7  PROT 7.4  ALBUMIN 4.0    Recent Labs Lab 08/26/14 1730 08/26/14 1736  WBC 7.7  --   NEUTROABS 4.1  --   HGB 15.7 17.0  HCT 46.2 50.0  MCV 90.9  --   PLT 175  --    No results for input(s): CKTOTAL, CKMB, CKMBINDEX, TROPONINI in the last 168 hours.  Recent Labs  08/26/14 1730  LABPROT 13.5  INR 1.01   No results for input(s): COLORURINE, LABSPEC, PHURINE, GLUCOSEU, HGBUR, BILIRUBINUR, KETONESUR, PROTEINUR, UROBILINOGEN, NITRITE, LEUKOCYTESUR in the last 72 hours.  Invalid input(s): APPERANCEUR     Component Value Date/Time   CHOL 130 08/02/2014 0445   TRIG 125 08/02/2014 0445   HDL 30* 08/02/2014 0445   CHOLHDL 4.3 08/02/2014 0445   VLDL 25 08/02/2014 0445   LDLCALC 75 08/02/2014 0445   Lab Results  Component Value Date   HGBA1C 6.4* 08/02/2014      Component Value Date/Time   LABOPIA POSITIVE* 08/27/2014 1630   COCAINSCRNUR POSITIVE* 08/27/2014 1630   LABBENZ POSITIVE* 08/27/2014 1630   AMPHETMU NONE DETECTED 08/27/2014 1630   THCU NONE DETECTED 08/27/2014 1630   LABBARB NONE DETECTED 08/27/2014 1630    No results for input(s): ETH in the last 168 hours.  Ct Head Wo Contrast 08/26/2014   Atrophy with mild small vessel chronic ischemic changes of deep  cerebral white matter.  No acute intracranial abnormalities.    MRI head  08/01/14 1. At least 5 punctate nonhemorrhagic infarcts over the right frontal lobe within a watershed distribution between the [place] and MCA territories. This corresponds with the hypotensive episode and left arm symptoms. 2. Moderate atrophy and diffuse white matter disease is advanced for age. This likely reflects the sequela of chronic microvascular ischemia. 3. 7 mm dural-based lesion over the left frontal convexity likely represents a meningioma. 4.  Chronic left ethmoid and maxillary sinus disease.  MRI head 08/19/14 Newly seen foci of acute infarction in the right posterior frontal cortex near the vertex. This could represent extension of watershed infarction or could be new embolic infarctions. No mass effect or hemorrhage in this location. Extensive chronic small vessel ischemic changes elsewhere throughout the brain. Increased FLAIR signal in the sulci posteriorly could relate to increased protein content of the CSF.  MRI HEAD 08/27/2014   New subcentimeter acute infarct RIGHT corpus callosum. Acute on chronic patchy acute ischemia RIGHT frontal lobe.  Chronic changes including moderate to severe chronic small vessel ischemic disease.  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    MRA HEAD 08/27/2014  Moderately motion degraded examination. Overall poor flow related enhancement of the RIGHT internal carotid artery may reflect proximal high-grade stenosis. In addition, extremely slow flow versus occluded RIGHT supraclinoid internal carotid artery. Findings would be better characterized on CTA of the head and neck as clinically indicated.  Moderate stenosis LEFT para clinoid internal carotid artery. High-grade stenosis RIGHT A1 segment. Overall limited assessment due to motion.     Carotid Doppler 08/02/2014 Bilateral: 1-39% ICA stenosis. Vertebral artery flow is antegrade. Left: Proximal portion of the ECA appears occluded   2D Echo 08/02/2014 EF 50-55%, no source of embolus  LE venous doppler - negative for DVT   PHYSICAL EXAM General: Mental Status: Alert, oriented, thought content appropriate. Speech fluent without evidence of aphasia. Cranial Nerves: II: Discs flat bilaterally; Visual fields grossly normal,  pupils equal, round, reactive to light and accommodation III,IV, VI: ptosis not present, extra-ocular motions intact bilaterally V,VII: smile symmetric, facial light touch sensation normal bilaterally VIII: hearing normal bilaterally IX,X: uvula rises symmetrically XI: bilateral shoulder shrug XII: midline tongue extension without atrophy or fasciculations Motor: Significant for mild left arm>leg weakness Tone and bulk:normal Sensory: Pinprick and light touch impaired in the left side. Deep Tendon Reflexes:  1+ all over Plantars: Right: downgoingLeft: downgoing Cerebellar: normal finger-to-nose, normal heel-to-shin test Gait:  No tested due to multiple leads, safety reasons.   ASSESSMENT/PLAN Mr. AUBRA PAPPALARDO is a 64 y.o. male with history of recent stroke 07/31/2014 (R frontal infarcts, felt to be watershed in setting of hypotension though could not rule out embolic source) as well as hypertension, hyperlipidemia, coronary artery disease with previous MI, COPD, congestive heart failure, alcohol abuse, tobacco abuse, history of DVT w/ poor medical compliance with anticoagulation presenting with worsening of residual left hemiparesis and dysarthria. He did not receive IV t-PA due to delay in arrival.   Stroke:  Non-dominant right corpus callosum infarcts in setting of R frontal infarcts in June, and new right frontal at early July, felt to be watershed in setting of hypotension due to R ICA supraclinoid near occlusion, though could not rule  out embolic source.  Resultant  L mild hemiparesis  MRI  New right corpus callosum infarct  MRA  Right supraclinoid ICA near occlusion  Carotid Doppler 08/02/2014 Bilateral: 1-39% ICA stenosis. Vertebral artery flow is antegrade.  2D Echo 08/02/2014 EF 50-55%, no source of embolus  TEE pending   Venous doppler showed no DVT  hypercoagulable panel / vasculitic labs pending   LDL 75  HgbA1c 6.4    Heparin 5000 units sq tid for VTE prophylaxis DIET DYS 3 Room service appropriate?: Yes; Fluid consistency:: Thin  aspirin 325 mg orally every day and clopidogrel 75 mg orally every day prior to admission, now on aspirin 325 mg orally every day and clopidogrel 75 mg orally every day.  Ongoing aggressive stroke risk factor management  May need to consider R supraclinoid ICA stent if pt continues to have right MCA infarcts under medical treatment.  Therapy recommendations:  No PT or OT  Disposition:  Return home  Hx of DVT  Hx DVT, PVL in 02/2014 significant for RLE DVT involving the popliteal vein, treated with xarelto, noncompliant - had stopped d/t rectal bleeding prior to last admission, never resumed, though instructed. Refused colonoscopy in hospital.  As per note, his 02/2014 DVT is the second DVT  However, pt is not compliance with [facility] and stated that he had GIB in the past with [facility].   Due to noncompliance with medication, illicit drug use and GIB on AC, we do not recommend to resume Xarelto at this time.   Pt has been 6 months out of his last DVT  hypercoagulable panel / vasculitic labs pending   LE venous doppler showed no DVT at this time  Hypertension  Stable  Avoid hypotension for stroke prevention, goal BP 120-150 due to high grade stenosis at right supreclinoid ICA.  Hyperlipidemia  Home meds:  LIPITOR 80, resumed in hospital  LDL 75, goal < 70  Continue statin at discharge  Illicit drug use  Positive for benzo, opiates and cocaine.   Pt denies using cocaine  Not good candidate for [facility]  Other Stroke Risk Factors  Advanced age  Cigarette smoker, advised to stop smoking  ETOH use  UDS positive for cocaine, opiate and benzos (no Rx)  Obesity, Body mass index is 32.95 kg/(m^2).   Hx stroke/TIA  Coronary artery disease - MI  Obstructive sleep apnea, on CPAP at home  Other Active Problems  AKI  PAD  CHF  OSA  COPD  Hospital day #  [place] for Pager information 08/28/2014 11:17 AM   I, the attending vascular neurologist, have personally obtained a history, examined the patient, evaluated laboratory data, individually viewed imaging studies and agree with radiology interpretations. Together with the NP/PA, we formulated the assessment and plan of care which reflects our mutual decision.  I have made any additions or clarifications directly to the above note and agree with the findings and plan as currently documented.   64 yo male with hx of HTN, HLD, CAD and MI in 2000, CHF, smoker, substance abuse, OSA, obese, COPD and recurrent DVT on Xarelto previously was admitted for recurrent embolic strokes at right MCA/ACA watershed territory. MRA showed high grade stenosis at right ICA supraclinoid section. His strokes likely related to hypotension with hypoperfusion to right MCA. Recommend to avoid hypotension, BP goal 120-150, and continue dural antiplatelet and statin. Pt does have recurrent DVT on Xarelto, however, he is non-compliant with Xarelto, substance abuse  with hx of GIB, would recommend to hold off Xarelto now. LE venous doppler negative for DVT this tim and recommend to check hypercoagulable panel. TEE pending.  Rosalin Hawking, MD PhD Stroke Neurology 08/28/2014 1:47 PM      To contact Stroke Continuity provider, please refer to [email or web address] After hours, contact General Neurology

## 2014-08-28 NOTE — Interval H&P Note (Signed)
History and Physical Interval Note:  08/28/2014 1:34 PM  [name]  has presented today for surgery, with the diagnosis of questionable PFO  The various methods of treatment have been discussed with the patient and family. After consideration of risks, benefits and other options for treatment, the patient has consented to  Procedure(s): TRANSESOPHAGEAL ECHOCARDIOGRAM (TEE) (N/A) as a surgical intervention .  The patient's history has been reviewed, patient examined, no change in status, stable for surgery.  I have reviewed the patient's chart and labs.  Questions were answered to the patient's satisfaction.     [name]

## 2014-08-28 NOTE — Progress Notes (Signed)
Pt refused to sit up the chair this am stated that his head was aching. Administered pain medication pt stated that is was relief but not effective applied a cool wash cloth to head. Will continue to monitor.

## 2014-08-28 NOTE — Progress Notes (Signed)
VASCULAR LAB PRELIMINARY  PRELIMINARY  PRELIMINARY  PRELIMINARY  Bilateral lower extremity venous duplex  completed.    Preliminary report:  Bilateral:  No obvious evidence of DVT, superficial thrombosis, or Baker's Cyst.  Left FV not adequately visualized due to multiple arterial interventions.    Katiana Ruland, RVT 08/28/2014, 11:52 AM

## 2014-08-28 NOTE — Progress Notes (Signed)
Occupational Therapy Treatment Patient Details Name: [name] MRN: [id number] DOB: [birth year] Today's Date: 08/28/2014    History of present illness [name] is a 64 y.o. male presenting with left arm weakness and numbness suggestive for stroke    OT comments  Pt progressing towards acute OT goals. Focus of session was LUE HEP and incorporating LUE into ADLs as detailed below. D/c plan remains appropriate.   Follow Up Recommendations  Supervision - Intermittent;Outpatient OT    Equipment Recommendations  None recommended by OT    Recommendations for Other Services      Precautions / Restrictions Precautions Precautions: Fall Restrictions Weight Bearing Restrictions: No       Mobility Bed Mobility Overal bed mobility: Needs Assistance Bed Mobility: Sidelying to Sit   Sidelying to sit: Supervision       General bed mobility comments: sidelying on right side to sitting EOB. Extra time. supervision for safety. use of bed rails. HOB elevated.   Transfers Overall transfer level: Modified independent Equipment used: Straight cane Transfers: Sit to/from Stand Sit to Stand: Supervision         General transfer comment: supervision for safety. Used cane.    Balance Overall balance assessment: Needs assistance Sitting-balance support: Feet supported Sitting balance-Leahy Scale: Normal     Standing balance support: Single extremity supported;During functional activity Standing balance-Leahy Scale: Fair Standing balance comment: cane for balance                   ADL                                         General ADL Comments: Pt completed bed mobility and ambulated to recliner as detailed below. Pt able to hold toiletry containers loosely but unable to perfrom in-hand manipulation. focus of session on HEP for theraband and [name] (theraputty).      Vision                     Perception     Praxis       Cognition   Behavior During Therapy: WFL for tasks assessed/performed Overall Cognitive Status: Within Functional Limits for tasks assessed                       Extremity/Trunk Assessment               Exercises Other Exercises Other Exercises: Issued level 2 theraband and super soft theraputty. Provided handout, demonstration. Pt completed return demonstration with cues initially for technique.    Shoulder Instructions       General Comments      Pertinent Vitals/ Pain       Pain Assessment: 0-10 Pain Score: 10-Worst pain ever (9.5) Pain Location: headache Pain Descriptors / Indicators: Aching Pain Intervention(s): Monitored during session;Repositioned;Other (comment) (Pt reports nurse recently gave pain meds.)  Home Living                                          Prior Functioning/Environment              Frequency Min 2X/week     Progress Toward Goals  OT Goals(current goals can now be found in the care plan section)  Progress towards  OT goals: Progressing toward goals  Acute Rehab OT Goals Patient Stated Goal: to go home OT Goal Formulation: With patient Time For Goal Achievement: 09/03/14 Potential to Achieve Goals: Good ADL Goals Pt Will Perform Grooming: with set-up;with supervision;standing Pt Will Perform Upper Body Bathing: with set-up;with supervision;standing;sitting Pt Will Perform Lower Body Bathing: with set-up;with supervision;sitting/lateral leans;sit to/from stand Pt Will Perform Upper Body Dressing: with set-up;sitting;standing Pt Will Perform Lower Body Dressing: with set-up;with supervision;sitting/lateral leans;sit to/from stand Pt Will Transfer to Toilet: with modified independence;ambulating;regular height toilet;grab bars Pt Will Perform Toileting - Clothing Manipulation and hygiene: with modified independence;sit to/from stand Pt Will Perform Tub/Shower Transfer: with modified independence;shower  seat;ambulating Additional ADL Goal #1: pt will tolerate L UE exercises with level 2 theraband to increase  for UE functional use Additional ADL Goal #2: pt will complete L hand [place] tasks to increase coordination and function for ADLs and functional tasks  Plan Discharge plan remains appropriate    Co-evaluation                 End of Session Equipment Utilized During Treatment: Gait belt;Other (comment) (cane)   Activity Tolerance Patient tolerated treatment well   Patient Left in chair;with call bell/phone within reach;with chair alarm set   Nurse Communication Other (comment) (headache)        Time: 3825-0539 OT Time Calculation (min): 29 min  Charges: OT General Charges $OT Visit: 1 Procedure OT Treatments $Self Care/Home Management : 8-22 mins $Therapeutic Exercise: 8-22 mins  Hortencia Pilar 08/28/2014, 11:47 AM

## 2014-08-28 NOTE — Progress Notes (Signed)
Speech Language Pathology Treatment: Dysphagia  Patient Details Name: Daelon L Blasdel MRN: [id number] DOB: 01/27/1951 Today's Date: 08/28/2014 Time: 1505-1516 SLP Time Calculation (min) (ACUTE ONLY): 11 min  Assessment / Plan / Recommendation Clinical Impression  Pt has no overt signs of aspiration across PO trials today, and no cues were needed for use of swallowing strategies. Pt continues to prefer the mechanical soft diet as he is edentulous. SLP to sign off for dysphagia.  Note that SLP orders for cognitive-linguistic evaluation per stroke protocol were discontinued by MD - please re-order SLP if this evaluation is still indicated.   HPI Other Pertinent Information: Demarion L Eberly is a 64 y.o. male admitted for worsening of left hemiparesis and dysarthria. MRI showed acute infarct right corpus callosum and acute on chronic patchy ischemia right frontal lobe. PMH: HTN, hyperlipidemia, DM type 2, stroke with residual, CAD, MI, chronic congestive heart failiure , alcohol abuse, drug abuse with cocaine positive on admit 7/10, recent stroke 07/31/2014 and 08/20/14 (R frontal infarcts, felt to be watershed in setting of hypotension though could not rule out embolic source right supraclinoid ICA stenosis), DVT/PE, poor compliance with anticoagulation and antiplatelet therapy, COPD.   Pertinent Vitals Pain Assessment: Faces Faces Pain Scale: No hurt  SLP Plan  All goals met    Recommendations Diet recommendations: Dysphagia 3 (mechanical soft);Thin liquid Liquids provided via: Cup;Straw Medication Administration: Whole meds with puree Supervision: Patient able to self feed;Intermittent supervision to cue for compensatory strategies Compensations: Slow rate;Small sips/bites Postural Changes and/or Swallow Maneuvers: Seated upright 90 degrees       Oral Care Recommendations: Oral care BID Follow up Recommendations: None Plan: All goals met     Paiewonsky, M.A.  CCC-SLP [phone]  Paiewonsky,  08/28/2014, 3:22 PM    

## 2014-08-29 ENCOUNTER — Inpatient Hospital Stay (HOSPITAL_COMMUNITY): Payer: Medicare Other

## 2014-08-29 ENCOUNTER — Encounter (HOSPITAL_COMMUNITY): Payer: Self-pay

## 2014-08-29 ENCOUNTER — Encounter (HOSPITAL_COMMUNITY)
Admission: EM | Disposition: A | Discharge status: Home / Self Care | Payer: Self-pay | Source: Home / Self Care | Attending: Family Medicine

## 2014-08-29 DIAGNOSIS — I340 Nonrheumatic mitral (valve) insufficiency: Secondary | ICD-10-CM

## 2014-08-29 DIAGNOSIS — I10 Essential (primary) hypertension: Secondary | ICD-10-CM | POA: Insufficient documentation

## 2014-08-29 HISTORY — PX: TEE WITHOUT CARDIOVERSION: SHX5443

## 2014-08-29 LAB — ANCA TITERS
Atypical P-ANCA titer: <1:20 {titer}
C-ANCA: <1:20 {titer}
P-ANCA: <1:20 {titer}

## 2014-08-29 LAB — ANTINUCLEAR ANTIBODIES, IFA: ANTINUCLEAR ANTIBODIES, IFA: NEGATIVE

## 2014-08-29 LAB — GLUCOSE, CAPILLARY
GLUCOSE-CAPILLARY: 119 mg/dL — AB (ref 65–99)
GLUCOSE-CAPILLARY: 115 mg/dL — AB (ref 65–99)
Glucose-Capillary: 100 mg/dL — ABNORMAL HIGH (ref 65–99)

## 2014-08-29 LAB — RHEUMATOID FACTOR

## 2014-08-29 LAB — PROTEIN C, TOTAL: Protein C, Total: 97 % (ref 70–140)

## 2014-08-29 LAB — HOMOCYSTEINE: HOMOCYSTEINE-NORM: 13.4 umol/L (ref 0.0–15.0)

## 2014-08-29 SURGERY — ECHOCARDIOGRAM, TRANSESOPHAGEAL
Anesthesia: Moderate Sedation

## 2014-08-29 MED ORDER — BUTAMBEN-TETRACAINE-BENZOCAINE 2-2-14 % EX AERO
INHALATION_SPRAY | CUTANEOUS | Status: DC | PRN
  Administered 2014-08-29: 1 via TOPICAL
  Administered 2014-08-29: 2 via TOPICAL

## 2014-08-29 MED ORDER — FENTANYL CITRATE (PF) 100 MCG/2ML IJ SOLN
INTRAMUSCULAR | Status: AC
  Filled 2014-08-29: qty 2

## 2014-08-29 MED ORDER — SODIUM CHLORIDE 0.9 % IV SOLN: INTRAVENOUS | Status: DC

## 2014-08-29 MED ORDER — FENTANYL CITRATE (PF) 100 MCG/2ML IJ SOLN
INTRAMUSCULAR | Status: DC | PRN
  Administered 2014-08-29 (×3): 25 ug via INTRAVENOUS

## 2014-08-29 MED ORDER — MIDAZOLAM HCL 5 MG/ML IJ SOLN
INTRAMUSCULAR | Status: AC
  Filled 2014-08-29: qty 2

## 2014-08-29 MED ORDER — MIDAZOLAM HCL 10 MG/2ML IJ SOLN
INTRAMUSCULAR | Status: DC | PRN
  Administered 2014-08-29: 2 mg via INTRAVENOUS
  Administered 2014-08-29: 1 mg via INTRAVENOUS
  Administered 2014-08-29: 2 mg via INTRAVENOUS

## 2014-08-29 NOTE — CV Procedure (Signed)
TEE: 5 mg versed 75 ug fentanyl  No SOE Mild MR AV sclerosis EF 55% No LAA thrombus Moderate mural aortic debris Normal RA/RV No ASD/PFO Negative bubble study  [name]

## 2014-08-29 NOTE — Care Management Important Message (Signed)
Important Message  Patient Details  Name: [name] MRN: [id number] Date of Birth: Jan 01, 1951   Medicare Important Message Given:  Yes-second notification given    [name] 08/29/2014, 1:06 PM

## 2014-08-29 NOTE — Discharge Instructions (Signed)
You were admitted for another stroke. It is very important for you to take your aspirin, Plavix, and lipitor EVERY DAY. Please follow-up with Dr. Parks Ranger this Monday (July 25) at 2 PM at the [facility].

## 2014-08-29 NOTE — Progress Notes (Signed)
Occupational Therapy Treatment Patient Details Name: [name] MRN: [id number] DOB: Dec 21, 1950 Today's Date: 08/29/2014    History of present illness [name] is a 64 y.o. male presenting with left arm weakness and numbness suggestive for stroke    OT comments  Pt progressing towards acute OT goals. Focus of session was dynamic standing balance and incorporating LUE into UB bathing and grooming tasks and HEP for LUE strengthening and [place] as detailed below. D/c plan remains appropriate.   Follow Up Recommendations  Supervision - Intermittent;Outpatient OT    Equipment Recommendations  None recommended by OT    Recommendations for Other Services      Precautions / Restrictions Precautions Precautions: Fall Restrictions Weight Bearing Restrictions: No       Mobility Bed Mobility Overal bed mobility: Needs Assistance Bed Mobility: Rolling;Sidelying to Sit Rolling: Supervision Sidelying to sit: Supervision;HOB elevated       General bed mobility comments: HOB elevated. extra time/effort but no physical assist. HOB elevated  Transfers Overall transfer level: Needs assistance Equipment used: Straight cane Transfers: Sit to/from Stand Sit to Stand: Supervision         General transfer comment: from EOB and recliner. supervision for safety.    Balance Overall balance assessment: Needs assistance         Standing balance support: Single extremity supported;During functional activity Standing balance-Leahy Scale: Fair Standing balance comment: cane for balance. stood to complete grooming and UB bathing with no LOB.                   ADL Overall ADL's : Needs assistance/impaired     Grooming: Wash/dry hands;Wash/dry face;Oral care;Applying deodorant;Brushing hair;Min guard;Standing;Cueing for compensatory techniques Grooming Details (indicate cue type and reason): encouraged pt to incorporate LUE into grooming tasks  Upper Body Bathing: Min  guard;Standing Upper Body Bathing Details (indicate cue type and reason): cued pt to use left hand to squeeze water out of washcloth                         Functional mobility during ADLs: Supervision/safety;Cane General ADL Comments: Pt ambulated to bathroom and completed grooming and UB bathing as detailed above. Encouraged pt to sit up in recliner for awhile. Reviewed LUE strengthening and [place] HEP with theraband and theraputty with pt completing exercises as detailed below.      Vision                     Perception     Praxis      Cognition   Behavior During Therapy: [facility] for tasks assessed/performed Overall Cognitive Status: Within Functional Limits for tasks assessed                       Extremity/Trunk Assessment               Exercises Other Exercises Other Exercises: Theraband level 2 exercises completed in seated position targeting extensor and flexor muscle groups. Reviewed resistance level and body mechanics during exercises. Pt completed various in-hand manipulation tasks with theraputty,    Shoulder Instructions       General Comments      Pertinent Vitals/ Pain       Pain Assessment: Faces Faces Pain Scale: Hurts a little bit Pain Location: headache Pain Descriptors / Indicators: Aching Pain Intervention(s): Premedicated before session;Monitored during session;Limited activity within patient's tolerance;Repositioned  Home Living  Prior Functioning/Environment              Frequency Min 2X/week     Progress Toward Goals  OT Goals(current goals can now be found in the care plan section)  Progress towards OT goals: Progressing toward goals  Acute Rehab OT Goals Patient Stated Goal: to go home OT Goal Formulation: With patient Time For Goal Achievement: 09/03/14 Potential to Achieve Goals: Good ADL Goals Pt Will Perform Grooming: with set-up;with  supervision;standing Pt Will Perform Upper Body Bathing: with set-up;with supervision;standing;sitting Pt Will Perform Lower Body Bathing: with set-up;with supervision;sitting/lateral leans;sit to/from stand Pt Will Perform Upper Body Dressing: with set-up;sitting;standing Pt Will Perform Lower Body Dressing: with set-up;with supervision;sitting/lateral leans;sit to/from stand Pt Will Transfer to Toilet: with modified independence;ambulating;regular height toilet;grab bars Pt Will Perform Toileting - Clothing Manipulation and hygiene: with modified independence;sit to/from stand Pt Will Perform Tub/Shower Transfer: with modified independence;shower seat;ambulating Additional ADL Goal #1: pt will tolerate L UE exercises with level 2 theraband to increase  for UE functional use Additional ADL Goal #2: pt will complete L hand [place] tasks to increase coordination and function for ADLs and functional tasks  Plan Discharge plan remains appropriate    Co-evaluation                 End of Session Equipment Utilized During Treatment: Gait belt;Other (comment) (cane)   Activity Tolerance Patient tolerated treatment well   Patient Left in chair;with call bell/phone within reach;with chair alarm set   Nurse Communication          Time: 2947-6546 OT Time Calculation (min): 28 min  Charges: OT General Charges $OT Visit: 1 Procedure OT Treatments $Self Care/Home Management : 8-22 mins $Therapeutic Exercise: 8-22 mins  Hortencia Pilar 08/29/2014, 2:45 PM

## 2014-08-29 NOTE — Discharge Summary (Signed)
[facility] Discharge Summary  Patient name: [name] Medical record number: [id number] Date of birth: 1950-12-04 Age: 64 y.o. Gender: male Date of Admission: 08/26/2014  Date of Discharge: 08/29/2014 Admitting Physician: Blane Ohara McDiarmid, MD  Primary Care Provider: Nobie Putnam, DO Consultants: neuro, cardiology  Indication for Hospitalization: CVA/stroke  Discharge Diagnoses/Problem List:    Disposition: home  Discharge Condition: stable  Discharge Exam:  Refer to the progress note from the day of discharge.  Brief Hospital Course:  [name] is a 64 y.o. male presenting with left arm weakness and numbness suggestive for stroke vs.TIA. PMH is significant for recent hospitalizations for CVA, HTN, CAD, PAD, HFpEF, tobacco use, h/o DVT/PE last in 02/2014.  Left arm weakness/numbness with drooling/right frontal headache, likely stroke/TIA: sudden onset. Similar presentation on recent hospitalization except for severe headache. Neuro exam significant for 4/5 strength (at shoulder, elbow and grip) & decreased sensation on the left side on arrival. HDS throughout his hospitalizations.UDS positive for cocaine, opiate and benzo. No acute intracranial pathology on CT head. Given recent hx of stroke and unknown last time well, he was not a candidate for tPA. Neuro consulted and saw patient in ED (refer to their note). MRI brain with new subcentimeter acute infarct right corpus callosum plus some acute on chronic right frontal lobe infarcts. Started on ASA 325 mg, Plavix 75 mg and Atorvastatin 80 mg daily. Cardiology consulted out of concern for PFO and thrombotic event. TEE result pending.  On day of discharge, patient's neurologic finding improved significantly. His headache resolved. Patient is agreeable to outpatient OT and has chosen [facility]. Referral was faxed, written information was provided and entered into the  AVS.  Other chronic conditions stable.  Issues for Follow Up:  1. Recurrent CVA: multiple risk factors as well as ICA stenosis. Outpatient OT. 2. Elicit drug use: UDS positive for cocaine, opiates and benzo although he doesn't have script for the  later two. Discussed with pt about the risk of illicit drug use and voiced understanding. Recommend  ongoing counseling on this. 3. Smoking: stress quit smoking  Significant Procedures: TEE  Significant Labs and Imaging:   Recent Labs Lab 08/26/14 1730 08/26/14 1736  WBC 7.7  --   HGB 15.7 17.0  HCT 46.2 50.0  PLT 175  --     Recent Labs Lab 08/26/14 1730 08/26/14 1736  NA 135 137  K 4.0 3.8  CL 100* 101  CO2 23  --   GLUCOSE 133* 134*  BUN 16 19  CREATININE 1.36* 1.30*  CALCIUM 9.6  --   ALKPHOS 69  --   AST 29  --   ALT 26  --   ALBUMIN 4.0  --     Results/Tests Pending at Time of Discharge: TEE  Discharge Medications:    Medication List    TAKE these medications        albuterol (2.5 MG/3ML) 0.083% nebulizer solution  Commonly known as:  PROVENTIL  Take 2.5 mg by nebulization 2 (two) times daily as needed for wheezing or shortness of breath.     albuterol 108 (90 BASE) MCG/ACT inhaler  Commonly known as:  PROVENTIL HFA;VENTOLIN HFA  Inhale 2 puffs into the lungs every 6 (six) hours as needed for wheezing or shortness of breath.     aspirin 325 MG EC tablet  Take 1 tablet (325 mg total) by mouth daily.     atorvastatin 40 MG tablet  Commonly  known as:  LIPITOR  Take 1 tablet (40 mg total) by mouth daily at 6 PM.     BREATHERITE COLL SPACER ADULT Misc  1 Device by Does not apply route as needed (with inhaler).     clopidogrel 75 MG tablet  Commonly known as:  PLAVIX  Take 1 tablet (75 mg total) by mouth daily.     clotrimazole-betamethasone cream  Commonly known as:  LOTRISONE  Apply 1 application topically 2 (two) times daily.     FOLIC ACID PO  Take 1 tablet by mouth daily.     gabapentin 100  MG capsule  Commonly known as:  NEURONTIN  Take 1 capsule (100 mg total) by mouth 3 (three) times daily.     mometasone-formoterol 200-5 MCG/ACT Aero  Commonly known as:  DULERA  Inhale 2 puffs into the lungs 2 (two) times daily.     nortriptyline 25 MG capsule  Commonly known as:  PAMELOR  Take 1 capsule (25 mg total) by mouth at bedtime.     omeprazole 20 MG capsule  Commonly known as:  PRILOSEC  Take 1 capsule (20 mg total) by mouth daily.     oxyCODONE-acetaminophen 5-325 MG per tablet  Commonly known as:  PERCOCET  Take 2 tablets by mouth every 6 (six) hours as needed for moderate pain.     tiotropium 18 MCG inhalation capsule  Commonly known as:  SPIRIVA  Place 1 capsule (18 mcg total) into inhaler and inhale daily.     vitamin B-12 100 MCG tablet  Commonly known as:  CYANOCOBALAMIN  Take 100 mcg by mouth daily.     VITAMIN B-6 PO  Take 1 tablet by mouth daily.     vitamin E 200 UNIT capsule  Take 200 Units by mouth daily.        Discharge Instructions: Please refer to Patient Instructions section of EMR for full details.  Patient was counseled important signs and symptoms that should prompt return to medical care, changes in medications, dietary instructions, activity restrictions, and follow up appointments.   Follow-Up Appointments: Follow-up Information    Follow up with [facility]   Specialty:  Rehabilitation   Why:  office will call with appointment date/time   Contact information:   [address] [place] [id number] [place] [address] [phone]      Follow up with Xu,Jindong, MD. Schedule an appointment as soon as possible for a visit in 2 months.   Specialty:  Neurology   Why:  stroke clinic   Contact information:   [address] [place] [address] [phone]       Mercy Riding, MD 08/30/2014, 12:52 AM PGY-1, [place]

## 2014-08-29 NOTE — Progress Notes (Signed)
STROKE TEAM PROGRESS NOTE   HISTORY [name] is an 64 y.o. male with a past medical history that is relevant for HTN, hyperlipidemia, DM type 2, stroke with residual, CAD, MI, chronic congestive heart failiure , alcohol abuse, drug abuse with cocaine positive on admit 7/10 ,recent stroke 07/31/2014 and 08/20/14 (R frontal infarcts, felt to be watershed in setting of hypotension though could not rule out embolic source right supraclinoid ICA stenosis), DVT/PE, poor compliance with anticoagulation and antiplatelet therapy, COPD, brought to [facility] for further evaluation of worsening of residual left hemiparesis and dysarthriahe above stated symptoms. He indicated that he woke up this morning with a terrible HA and later on developed worsening of left sided arm>leg weakness as well significant change in his speech. No associated vertigo, double vision, difficulty swallowing, confusion, or visual disturbances. Stated that he can not barely use his left hand to eat. CT brain performed today was personally reviewed and showed no acute intracranial abnormality. Patient is on plavix. Last known well: unable to determine. Patient was not administered TPA secondary to late presentation. He was admitted for further evaluation and treatment.   SUBJECTIVE (INTERVAL HISTORY) No family at bedside. No complains today and pt is waiting for TEE.   OBJECTIVE Temp:  [97.4 F (36.3 C)-98.3 F (36.8 C)] 98.2 F (36.8 C) (07/20 0614) Pulse Rate:  [76-91] 76 (07/20 0614) Cardiac Rhythm:  [-]  Resp:  [18-20] 19 (07/20 0614) BP: (136-169)/(54-80) 146/70 mmHg (07/20 0614) SpO2:  [93 %-98 %] 94 % (07/20 0614)   Recent Labs Lab 08/28/14 0604 08/28/14 1117 08/28/14 1615 08/28/14 2140 08/29/14 0612  GLUCAP 168* 107* 159* 155* 119*    Recent Labs Lab 08/26/14 1730 08/26/14 1736  NA 135 137  K 4.0 3.8  CL 100* 101  CO2 23  --   GLUCOSE 133* 134*  BUN 16 19  CREATININE 1.36* 1.30*  CALCIUM 9.6  --      Recent Labs Lab 08/26/14 1730  AST 29  ALT 26  ALKPHOS 69  BILITOT 0.7  PROT 7.4  ALBUMIN 4.0    Recent Labs Lab 08/26/14 1730 08/26/14 1736  WBC 7.7  --   NEUTROABS 4.1  --   HGB 15.7 17.0  HCT 46.2 50.0  MCV 90.9  --   PLT 175  --    No results for input(s): CKTOTAL, CKMB, CKMBINDEX, TROPONINI in the last 168 hours.  Recent Labs  08/26/14 1730  LABPROT 13.5  INR 1.01   No results for input(s): COLORURINE, LABSPEC, PHURINE, GLUCOSEU, HGBUR, BILIRUBINUR, KETONESUR, PROTEINUR, UROBILINOGEN, NITRITE, LEUKOCYTESUR in the last 72 hours.  Invalid input(s): APPERANCEUR     Component Value Date/Time   CHOL 130 08/02/2014 0445   TRIG 125 08/02/2014 0445   HDL 30* 08/02/2014 0445   CHOLHDL 4.3 08/02/2014 0445   VLDL 25 08/02/2014 0445   LDLCALC 75 08/02/2014 0445   Lab Results  Component Value Date   HGBA1C 6.4* 08/02/2014      Component Value Date/Time   LABOPIA POSITIVE* 08/27/2014 1630   COCAINSCRNUR POSITIVE* 08/27/2014 1630   LABBENZ POSITIVE* 08/27/2014 1630   AMPHETMU NONE DETECTED 08/27/2014 1630   THCU NONE DETECTED 08/27/2014 1630   LABBARB NONE DETECTED 08/27/2014 1630    No results for input(s): ETH in the last 168 hours.  Ct Head Wo Contrast 08/26/2014   Atrophy with mild small vessel chronic ischemic changes of deep cerebral white matter.  No acute intracranial abnormalities.    MRI head  08/01/14 1. At least 5 punctate nonhemorrhagic infarcts over the right frontal lobe within a watershed distribution between the [place] and MCA territories. This corresponds with the hypotensive episode and left arm symptoms. 2. Moderate atrophy and diffuse white matter disease is advanced for age. This likely reflects the sequela of chronic microvascular ischemia. 3. 7 mm dural-based lesion over the left frontal convexity likely represents a meningioma. 4. Chronic left ethmoid and maxillary sinus disease.  MRI head 08/19/14 Newly seen foci of acute  infarction in the right posterior frontal cortex near the vertex. This could represent extension of watershed infarction or could be new embolic infarctions. No mass effect or hemorrhage in this location. Extensive chronic small vessel ischemic changes elsewhere throughout the brain. Increased FLAIR signal in the sulci posteriorly could relate to increased protein content of the CSF.  MRI HEAD 08/27/2014   New subcentimeter acute infarct RIGHT corpus callosum. Acute on chronic patchy acute ischemia RIGHT frontal lobe.  Chronic changes including moderate to severe chronic small vessel ischemic disease.  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    MRA HEAD 08/27/2014  Moderately motion degraded examination. Overall poor flow related enhancement of the RIGHT internal carotid artery may reflect proximal high-grade stenosis. In addition, extremely slow flow versus occluded RIGHT supraclinoid internal carotid artery. Findings would be better characterized on CTA of the head and neck as clinically indicated.  Moderate stenosis LEFT para clinoid internal carotid artery. High-grade stenosis RIGHT A1 segment. Overall limited assessment due to motion.     Carotid Doppler 08/02/2014 Bilateral: 1-39% ICA stenosis. Vertebral artery flow is antegrade. Left: Proximal portion of the ECA appears occluded   2D Echo 08/02/2014 EF 50-55%, no source of embolus  LE venous doppler - negative for DVT  TEE - No SOE Mild MR AV sclerosis EF 55% No LAA thrombus Moderate mural aortic debris Normal RA/RV No ASD/PFO Negative bubble study   PHYSICAL EXAM General: Mental Status: Alert, oriented, thought content appropriate. Speech fluent without evidence of aphasia. Cranial Nerves: II: Discs  flat bilaterally; Visual fields grossly normal, pupils equal, round, reactive to light and accommodation III,IV, VI: ptosis not present, extra-ocular motions intact bilaterally V,VII: smile symmetric, facial light touch sensation normal bilaterally VIII: hearing normal bilaterally IX,X: uvula rises symmetrically XI: bilateral shoulder shrug XII: midline tongue extension without atrophy or fasciculations Motor: Significant for mild left arm>leg weakness Tone and bulk:normal Sensory: Pinprick and light touch impaired in the left side. Deep Tendon Reflexes:  1+ all over Plantars: Right: downgoingLeft: downgoing Cerebellar: normal finger-to-nose, normal heel-to-shin test Gait:  No tested due to multiple leads, safety reasons.   ASSESSMENT/PLAN [name] is a 64 y.o. male with history of recent stroke 07/31/2014 (R frontal infarcts, felt to be watershed in setting of hypotension though could not rule out embolic source) as well as hypertension, hyperlipidemia, coronary artery disease with previous MI, COPD, congestive heart failure, alcohol abuse, tobacco abuse, history of DVT w/ poor medical compliance with anticoagulation presenting with worsening of residual left hemiparesis and dysarthria. He did not receive IV t-PA due to delay in arrival.   Stroke:  Non-dominant right corpus callosum infarcts in setting of R frontal infarcts in June, and new right frontal at early July, felt to be watershed in setting of hypotension  due to R ICA supraclinoid near occlusion, though could not rule out embolic source.  Resultant  L mild hemiparesis  MRI  New right corpus callosum infarct  MRA  Right supraclinoid ICA near occlusion  Carotid Doppler 08/02/2014 Bilateral: 1-39% ICA stenosis. Vertebral artery flow is antegrade.  2D Echo 08/02/2014 EF 50-55%, no source of embolus  TEE no source of emboli, no PFO  Venous doppler showed no  DVT  hypercoagulable panel / vasculitic labs pending - RF & Anti III neg;   LDL 75  HgbA1c 6.4   Heparin 5000 units sq tid for VTE prophylaxis Diet NPO time specified  aspirin 325 mg orally every day and clopidogrel 75 mg orally every day prior to admission, now on aspirin 325 mg orally every day and clopidogrel 75 mg orally every day.  Ongoing aggressive stroke risk factor management  May need to consider R supraclinoid ICA stent if pt continues to have right MCA infarcts under medical treatment.  Therapy recommendations:  No PT or OT  Disposition:  Return home  Hx of DVT  Hx DVT, PVL in 02/2014 significant for RLE DVT involving the popliteal vein, treated with xarelto, noncompliant - had stopped d/t rectal bleeding prior to last admission, never resumed, though instructed. Refused colonoscopy in hospital.  As per note, his 02/2014 DVT is the second DVT  However, pt is not compliance with [facility] and stated that he had GIB in the past with [facility].   Due to noncompliance with medication, illicit drug use and GIB on AC, we do not recommend to resume Xarelto at this time.   Pt has been 6 months out of his last DVT  hypercoagulable panel / vasculitic labs pending - RF & Anti III neg;   LE venous doppler showed no DVT at this time  Hypertension  Stable  Avoid hypotension for stroke prevention, goal BP 120-150 due to high grade stenosis at right supreclinoid ICA.  Hyperlipidemia  Home meds:  LIPITOR 80, resumed in hospital  LDL 75, goal < 70  Continue statin at discharge  Illicit drug use  Positive for benzo, opiates and cocaine.   Pt denies using cocaine  Not good candidate for [facility]  Other Stroke Risk Factors  Advanced age  Cigarette smoker, advised to stop smoking  ETOH use  UDS positive for cocaine, opiate and benzos (no Rx)  Obesity, Body mass index is 32.95 kg/(m^2).   Hx stroke/TIA  Coronary artery disease - MI  Obstructive sleep apnea, on CPAP at  home  Other Active Problems  AKI  PAD  CHF  OSA  COPD  Hospital day # 2  Neurology will sign off. Please call with questions. Pt will follow up with Dr. Erlinda Hong at [facility] in about 2 months. Thanks for the consult.  Rosalin Hawking, MD PhD Stroke Neurology 08/29/2014 4:19 PM    To contact Stroke Continuity provider, please refer to [email or web address] After hours, contact General Neurology

## 2014-08-29 NOTE — Progress Notes (Signed)
*  PRELIMINARY RESULTS* Echocardiogram Echocardiogram Transesophageal has been performed.  [name] 08/29/2014, 11:54 AM

## 2014-08-29 NOTE — Progress Notes (Signed)
Family Medicine Teaching Service Daily Progress Note Intern Pager: [phone]  Patient name: [name] Medical record number: [id number] Date of birth: 03/15/1950 Age: 64 y.o. Gender: male  Primary Care Provider: Nobie Putnam, DO Consultants: neuro Code Status: DNR/DNI  Pt Overview and Major Events to Date:  07/17: Admit to stepdown 07/18: continued weakness in LUE and HA, called cards for TEE    Assessment and Plan:  [name] is a 64 y.o. male presenting with left arm weakness and numbness suggestive for stroke vs.TIA. PMH is significant for recent hospitalizations for CVA, HTN, CAD, PAD, HFpEF, tobacco use, h/o DVT/PE last in 02/2014.  Left arm weakness/numbness with drooling, likely stroke/TIA: sudden onset. Similar presentation on recent hospitalization. Neuro exam significant for 4/5 strength (at shoulder, elbow and grip), decreased sensation on the left. No acute intracranial pathology on CT head. His imaging from recent hospitalization correlates with his presentation. Given recent hx of stroke, he is not a candidate for tPA. Neuro consulted and saw patient in ED. MRI brain: New subcentimeter acute infarct RIGHT corpus callosum. Acute on chronic patchy acute ischemia RIGHT frontal lobe.  - Admit to tele - Appreciate neuro recs: - ASA 325 mg + Plavix 75 mg daily - Atorvastatin 80 mg daily - Carotid doppler, echo and risk stratification labs done in the last month so will not repeat - TEE today for ?PFO/?LV thrombus - Passed SLP, PT/OT evals  Headache: resolved.  8/10 in intensity and light sensitivity yesterday. - Will consider reimaging again if pain worsens - continue morphine  Elicit drug use: UDS positive for cocaine, opiate and benzo. He wasn't supposed to on opiate or benzo per EMR.  -Discussed about the results and health consequence with a patient.  AKI: Cr 1.30 on admission, down from 1.6 last week. B/l seems to be ~0.9.  -  monitor Cr  HTN: 110s/60s on arrival. Lisinopril stopped last admission - continue to hold home lisinopril   PAD: DP not palpable. LE arterial duplex in 2014 with severe proximal disease in right leg and mild to moderate range for proximal disease in the left..On gabapentin 100 mg. - continue home gabapentin - consider repeat arterial duplex   CAD/MI/CHF: hx of MI in 2000 per EMR. Also single vessel cath in 2012. No chest pain or dyspnea this time. Recent 2D echo on 06/23 with EF of 50-55% with grade 1DD. A1c 6.4 on 6/23. -atorvastatin as above -ASA as above  Hx of DVT/PE: his PVL in 02/2014 significant for RLE DVT involving the popliteal vein. And superficial thrombosis of calf veins. On Xarelto in the past but poor compliance. - will continue antiplatelets for now - discuss restarting xarelto  COPD: on albuterol neb and Spiriva at home. Not on oxygen. No SOB or respiratory symptoms this time. - continue home spiriva - Duonebs q2hrs prn - O2 for SpO < 88%  Smoking: a pack a day for 30 yrs.  - Encourage quitting - nicotine patch if requested   FEN/GI:  F: SLIV E: replete as needed N: NPO pending SLP eval  GI: protonix  Prophylaxis: SQ heparin  Disposition: floor. Home pending TEE finding  Subjective: Patient sleeping on the bed. Easily arousable. Denies headache this morning. Reports improvement in his arm weakness and numbness.   Objective: Temp:  [97.4 F (36.3 C)-98.3 F (36.8 C)] 98.2 F (36.8 C) (07/20 0347) Pulse Rate:  [76-91] 76 (07/20 0614) Resp:  [18-20] 19 (07/20 0614) BP: (136-169)/(54-80) 146/70 mmHg (07/20 0614) SpO2:  [  93 %-98 %] 94 % (07/20 1121)   Physical Exam: General: NAD, able to sit up on the edge of the bed w/o distress and oriented x3 Cardiovascular: RRR, normal s1 and s2 Respiratory: clear to auscultation bilaterally Abdomen: +BS, soft, non distended, non tender Neuro: AAOx3, CN II-XII intact, LUE 4+/5 strength at elbow, 5/5 else  wehre. Sensation intact. Knee reflexes 2+ in the left, 1+ in the right. Overall, improved from my exam yesterday morning.  Laboratory:  Recent Labs Lab 08/26/14 1730 08/26/14 1736  WBC 7.7  --   HGB 15.7 17.0  HCT 46.2 50.0  PLT 175  --     Recent Labs Lab 08/26/14 1730 08/26/14 1736  NA 135 137  K 4.0 3.8  CL 100* 101  CO2 23  --   BUN 16 19  CREATININE 1.36* 1.30*  CALCIUM 9.6  --   PROT 7.4  --   BILITOT 0.7  --   ALKPHOS 69  --   ALT 26  --   AST 29  --   GLUCOSE 133* 134*    Imaging/Diagnostic Tests: No results found.  Mercy Riding, MD 08/29/2014, 7:27 AM PGY-1, [place] Intern pager: [phone], text pages welcome

## 2014-08-29 NOTE — Care Management Note (Signed)
Case Management Note  Patient Details  Name: TOSHIRO HANKEN MRN: [id number] Date of Birth: December 14, 1950  Subjective/Objective:                    Action/Plan: Met with patient to discuss discharge planning. Patient is agreeable to outpatient OT and has chosen [facility].  Referral was faxed, written information was provided and entered into the AVS. Bedside RN updated.  Expected Discharge Date:                  Expected Discharge Plan:  Home/Self Care  In-House Referral:     Discharge planning Services  CM Consult  Post Acute Care Choice:    Choice offered to:  Patient  DME Arranged:    DME Agency:     HH Arranged:    [place] Agency:     Status of Service:     Medicare Important Message Given:  Yes-second notification given Date Medicare IM Given:    Medicare IM give by:    Date Additional Medicare IM Given:    Additional Medicare Important Message give by:     If discussed at [place] of Stay Meetings, dates discussed:    Additional Comments:  Rolm Baptise, RN 08/29/2014, 4:36 PM

## 2014-08-29 NOTE — Progress Notes (Signed)
Discharge orders received. Pt educated on discharge orders and stroke education. Pt given discharge packet. IV removed. Pt dressed and belongings packed. [facility] consent signed and order placed. Pt taken downstairs by staff via wheelchair.

## 2014-08-30 ENCOUNTER — Encounter (HOSPITAL_COMMUNITY): Payer: Self-pay | Admitting: Cardiovascular Disease

## 2014-08-30 LAB — CARDIOLIPIN ANTIBODIES, IGG, IGM, IGA
Anticardiolipin IgA: <9 APL U/mL (ref 0–11)
Anticardiolipin IgG: <9 GPL U/mL (ref 0–14)
Anticardiolipin IgM: 9 MPL U/mL (ref 0–12)

## 2014-08-30 LAB — PROTEIN C ACTIVITY: PROTEIN C ACTIVITY: 160 % — AB (ref 74–151)

## 2014-08-30 LAB — BETA-2-GLYCOPROTEIN I ABS, IGG/M/A: Beta-2-Glycoprotein I IgM: <9 GPI IgM units (ref 0–32)

## 2014-08-30 LAB — LUPUS ANTICOAGULANT PANEL
DRVVT: 36.4 s (ref 0.0–55.1)
PTT LA: 35 s (ref 0.0–50.0)

## 2014-08-30 LAB — PROTEIN S, TOTAL: Protein S Ag, Total: 119 % (ref 58–150)

## 2014-08-30 LAB — PROTEIN S ACTIVITY: Protein S Activity: 82 % (ref 60–145)

## 2014-09-03 ENCOUNTER — Inpatient Hospital Stay: Payer: Self-pay | Admitting: Family Medicine

## 2014-09-03 LAB — PROTHROMBIN GENE MUTATION

## 2014-09-03 LAB — FACTOR 5 LEIDEN

## 2014-09-13 ENCOUNTER — Other Ambulatory Visit: Payer: Self-pay | Admitting: Family Medicine

## 2014-09-13 DIAGNOSIS — I639 Cerebral infarction, unspecified: Secondary | ICD-10-CM

## 2014-09-13 IMAGING — CT CT ANGIO CHEST
2 of 9 series · 19 of 46 positions shown · IV contrast (APPLIED)
Comparison: 08/27/2012

CLINICAL DATA: Acute onset chest pain, shortness of breath, and
hemoptysis.

EXAM:
CT ANGIOGRAPHY CHEST WITH CONTRAST
TECHNIQUE: Multidetector CT imaging of the chest was performed using the
standard protocol during bolus administration of intravenous
contrast. Multiplanar CT image reconstructions including MIPs were
obtained to evaluate the vascular anatomy.
CONTRAST:  80mL OMNIPAQUE IOHEXOL 350 MG/ML SOLN

[Series 5: thins · axial · 0.70mm/px · z∈[-299,-47]mm · 16 of 284 slices shown]
[im 16/284  lung]
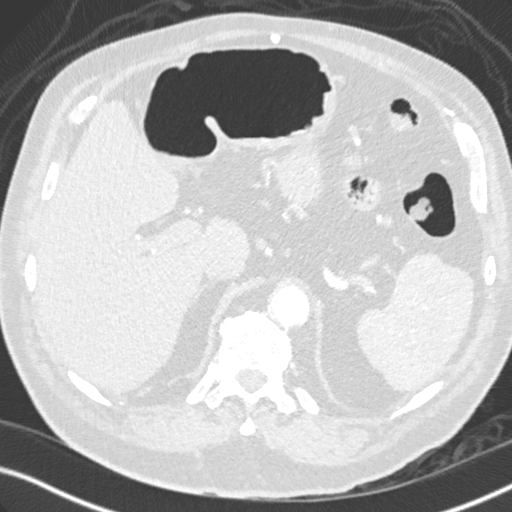
[im 32/284  soft-tissue]
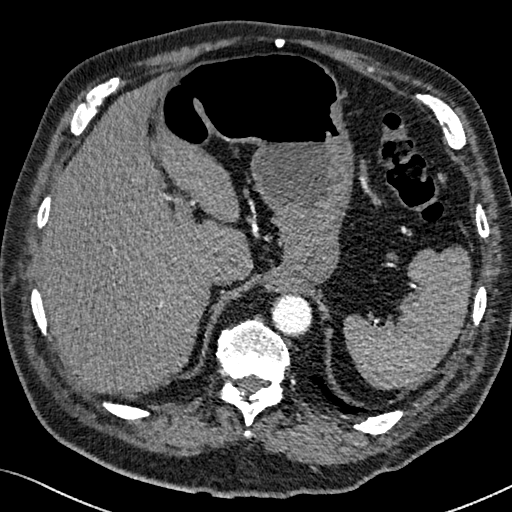
[im 48/284  lung]
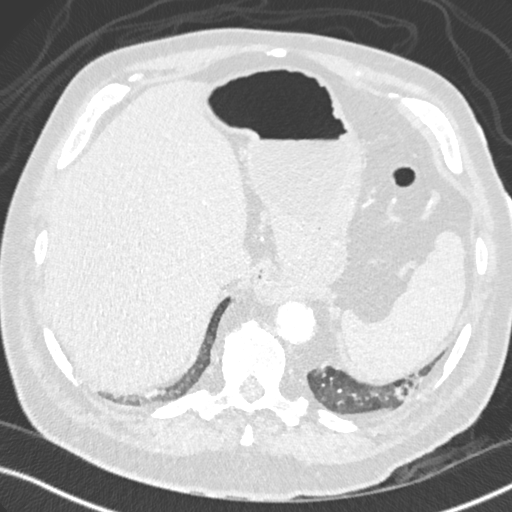
[im 63/284  soft-tissue]
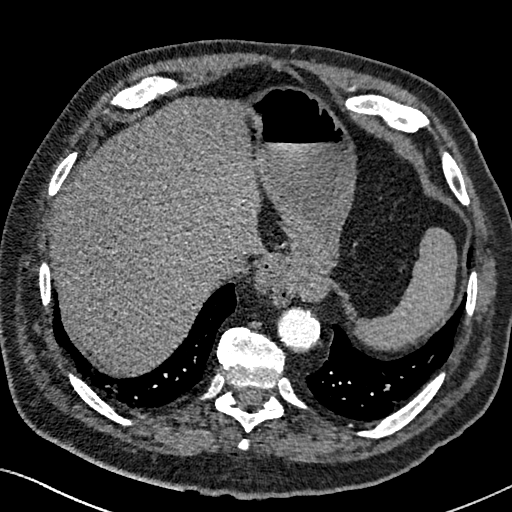
[im 79/284  lung]
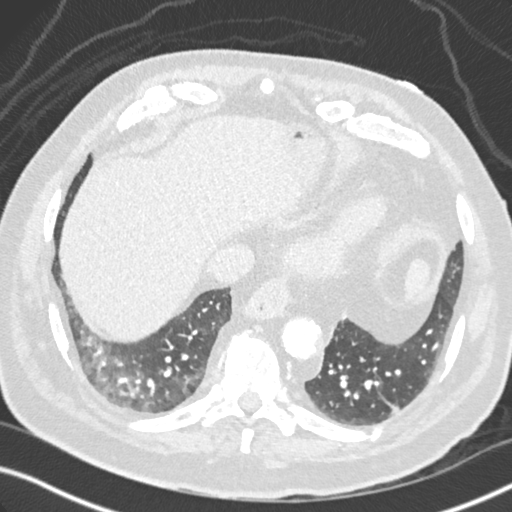
[im 95/284  soft-tissue]
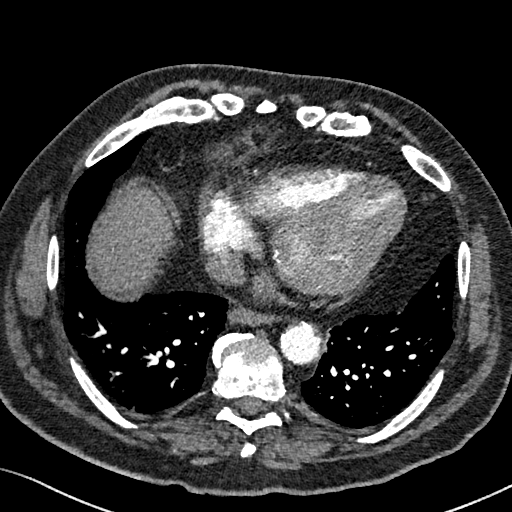
[im 111/284  lung]
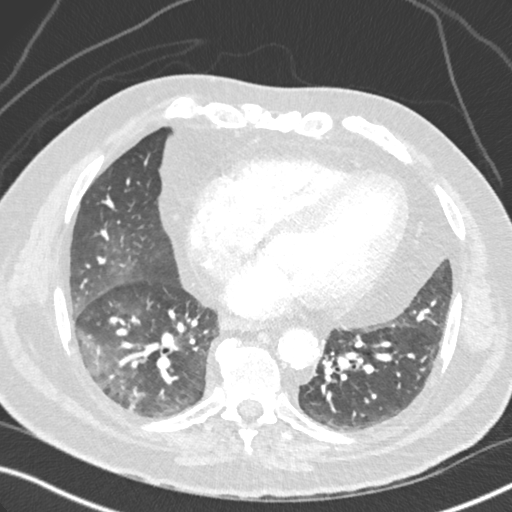
[im 126/284  soft-tissue]
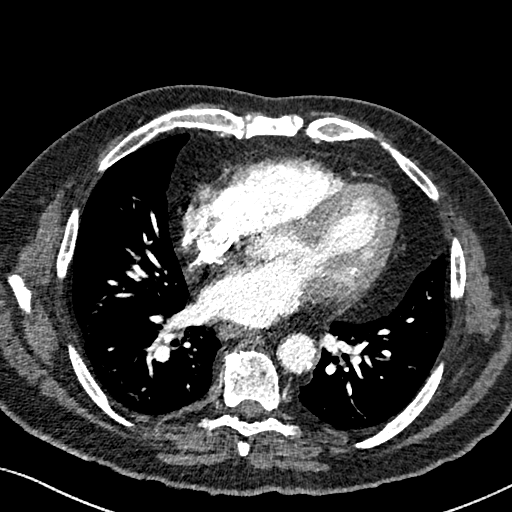
[im 158/284  lung]
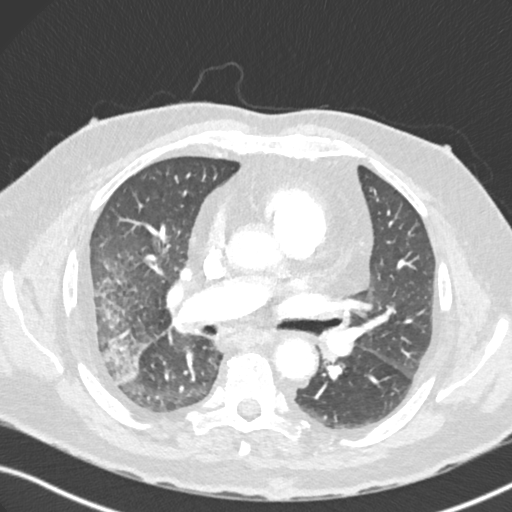
[im 173/284  soft-tissue]
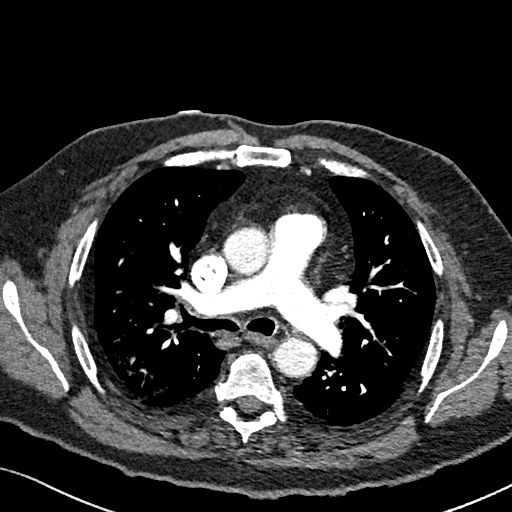
[im 189/284  lung]
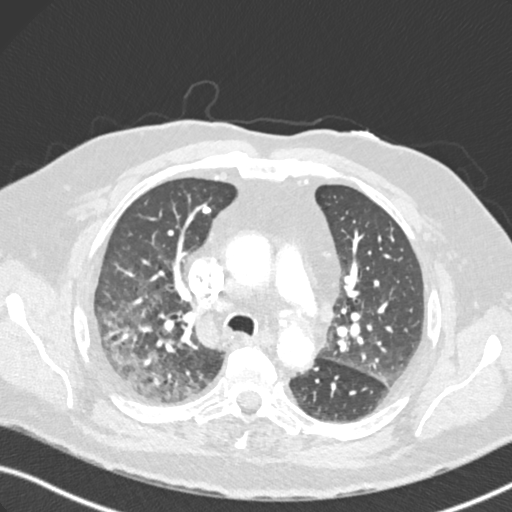
[im 205/284  soft-tissue]
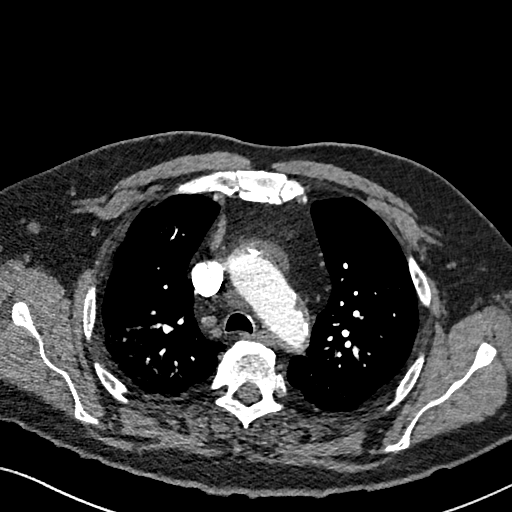
[im 221/284  lung]
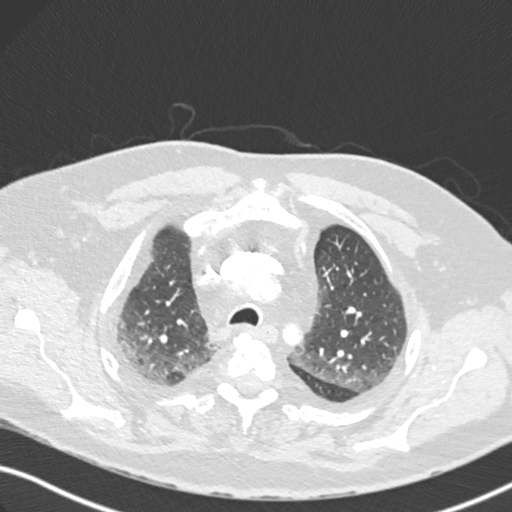
[im 236/284  soft-tissue]
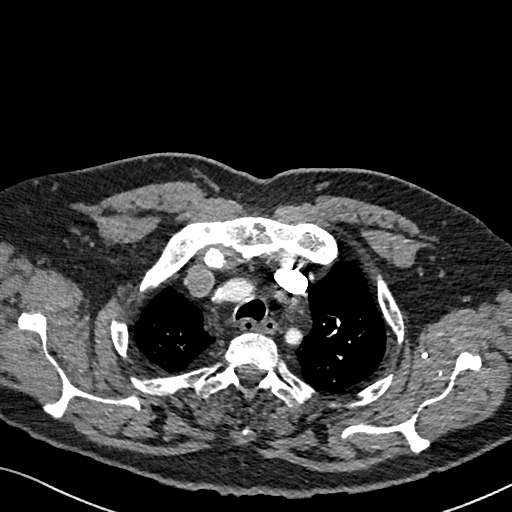
[im 252/284  lung]
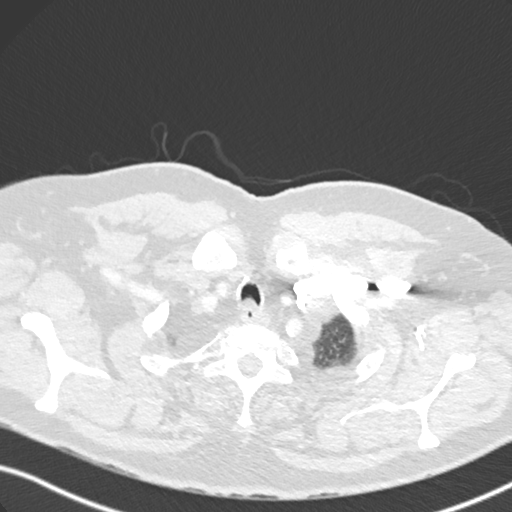
[im 268/284  soft-tissue]
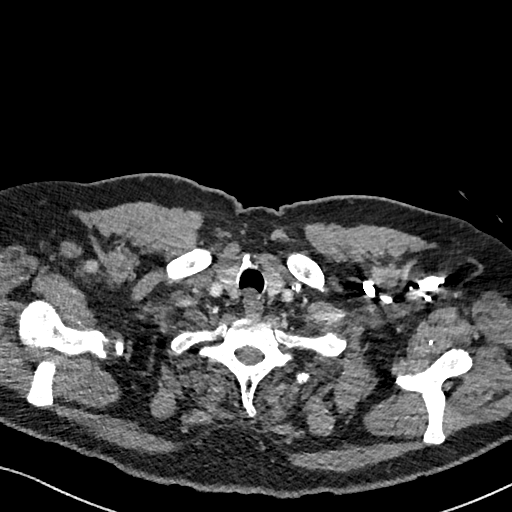

[Series 7: coronal mpr · coronal · 0.59mm/px · 3 of 153 slices shown]
[im 39/153  soft-tissue]
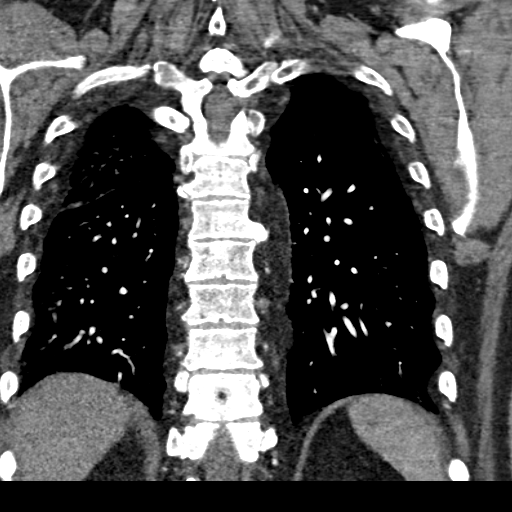
[im 77/153  soft-tissue]
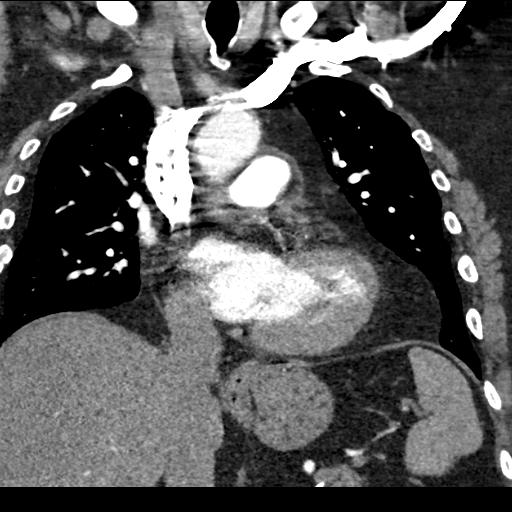
[im 115/153  soft-tissue]
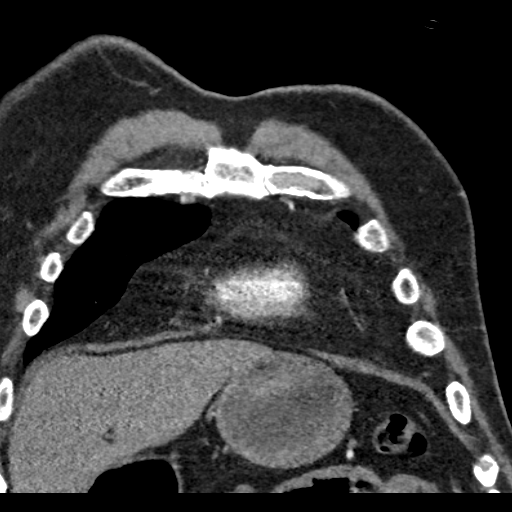

[19 of 46 positions shown; findings below may reference images not displayed]

FINDINGS: Satisfactory opacification of pulmonary arteries noted, and there is
no evidence of pulmonary emboli. No evidence of thoracic aortic
dissection or aneurysm. No evidence of mediastinal hematoma or mass.
No adenopathy seen within the thorax. Small hiatal hernia is noted.

The no evidence of pleural or pericardial effusion. New mild
airspace disease is seen in the peripheral right upper and lower
lobes, suspicious for pneumonia. No suspicious pulmonary nodules or
masses are identified. There is no evidence of central endobronchial
obstruction.

Review of the MIP images confirms the above findings.
IMPRESSION: No evidence of pulmonary embolism.

Mild asymmetric airspace disease in right upper and lower lobes,
consistent with infection, hemorrhage, or other inflammatory
process.

No mass or lymphadenopathy identified.

Small hiatal hernia.

## 2014-09-13 IMAGING — CR DG CHEST 2V
2 series · 2 of 2 positions shown · non-contrast
Comparison: 09/11/2012.

CLINICAL DATA: Short of breath. Hemoptysis.

EXAM:
CHEST  2 VIEW

[w chest pa]
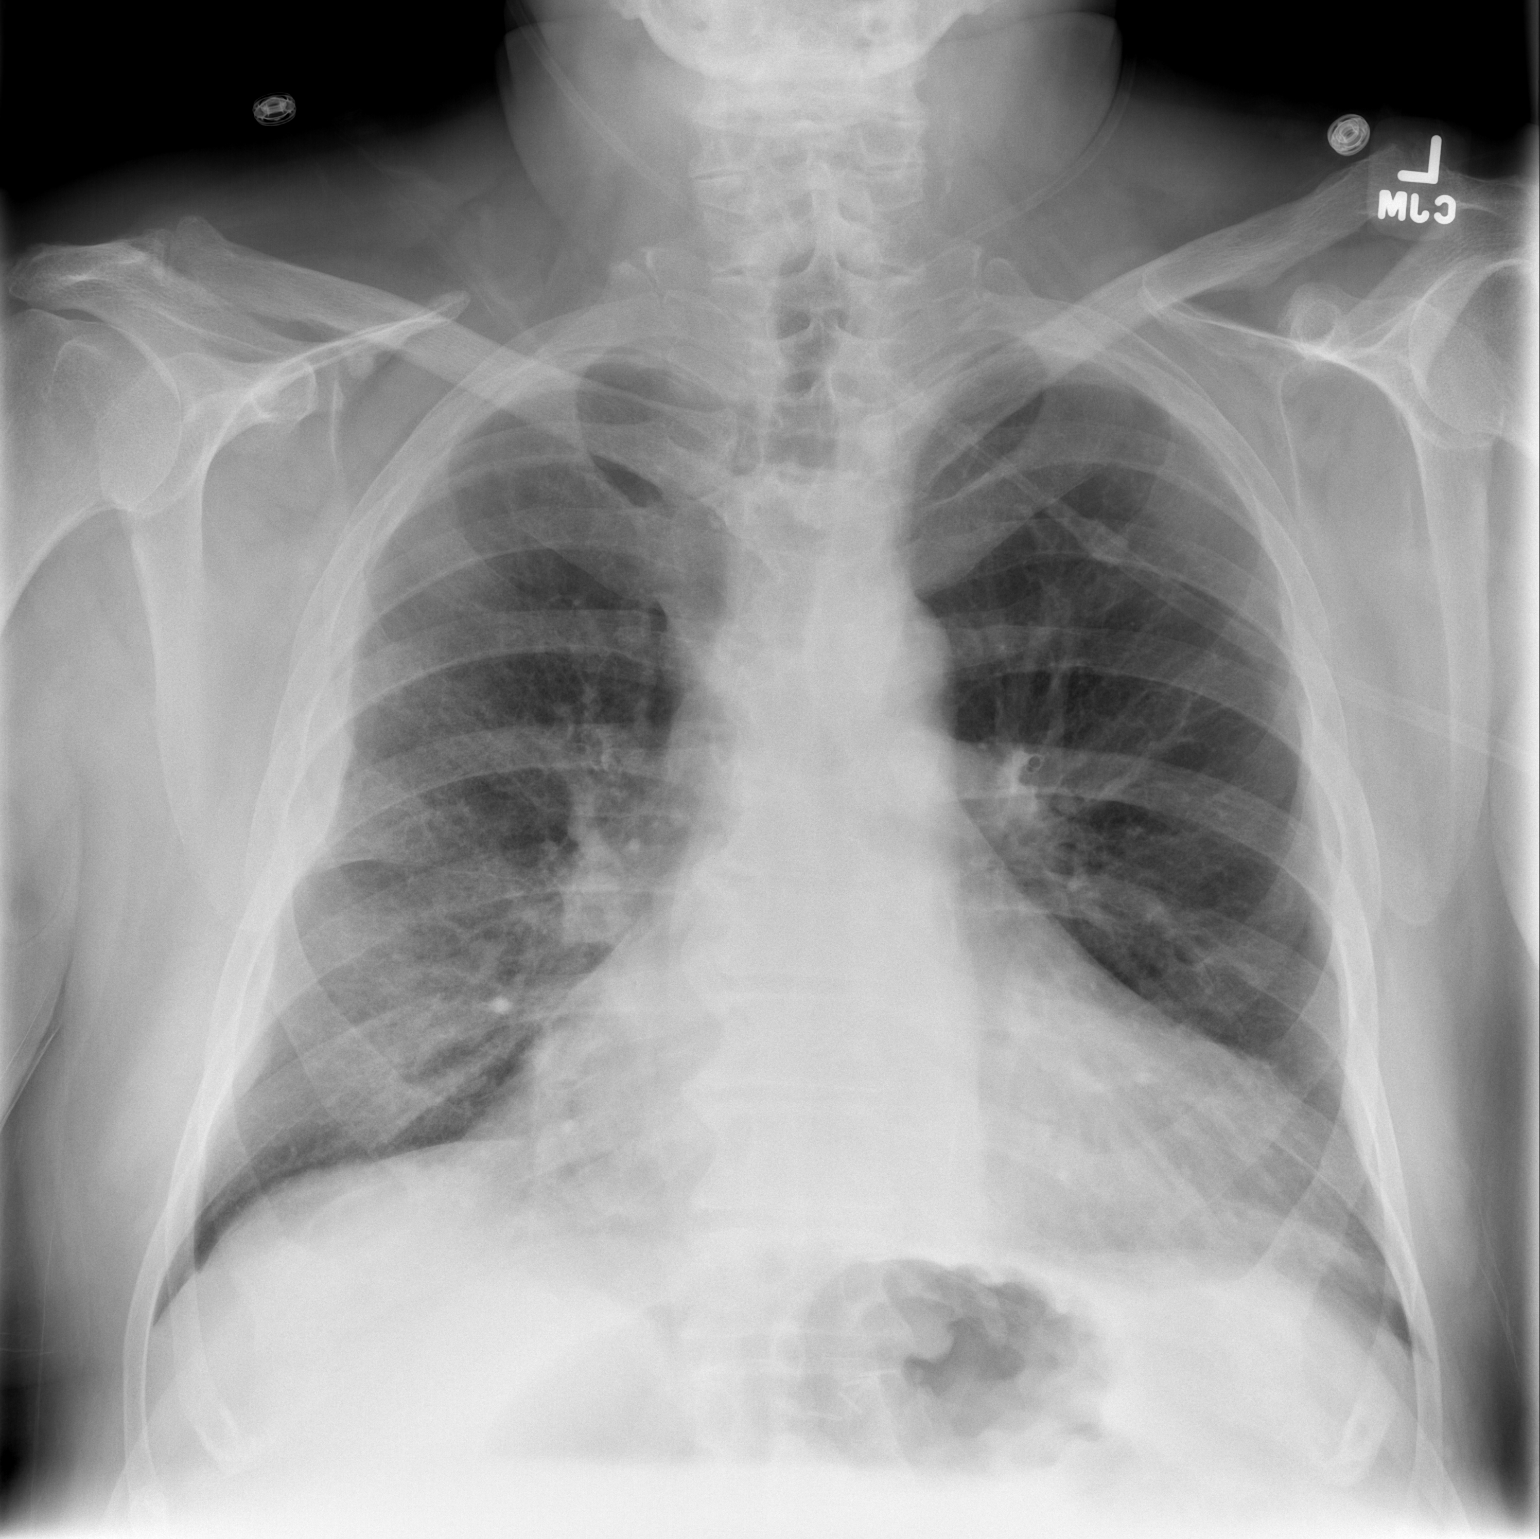

[w chest lat]
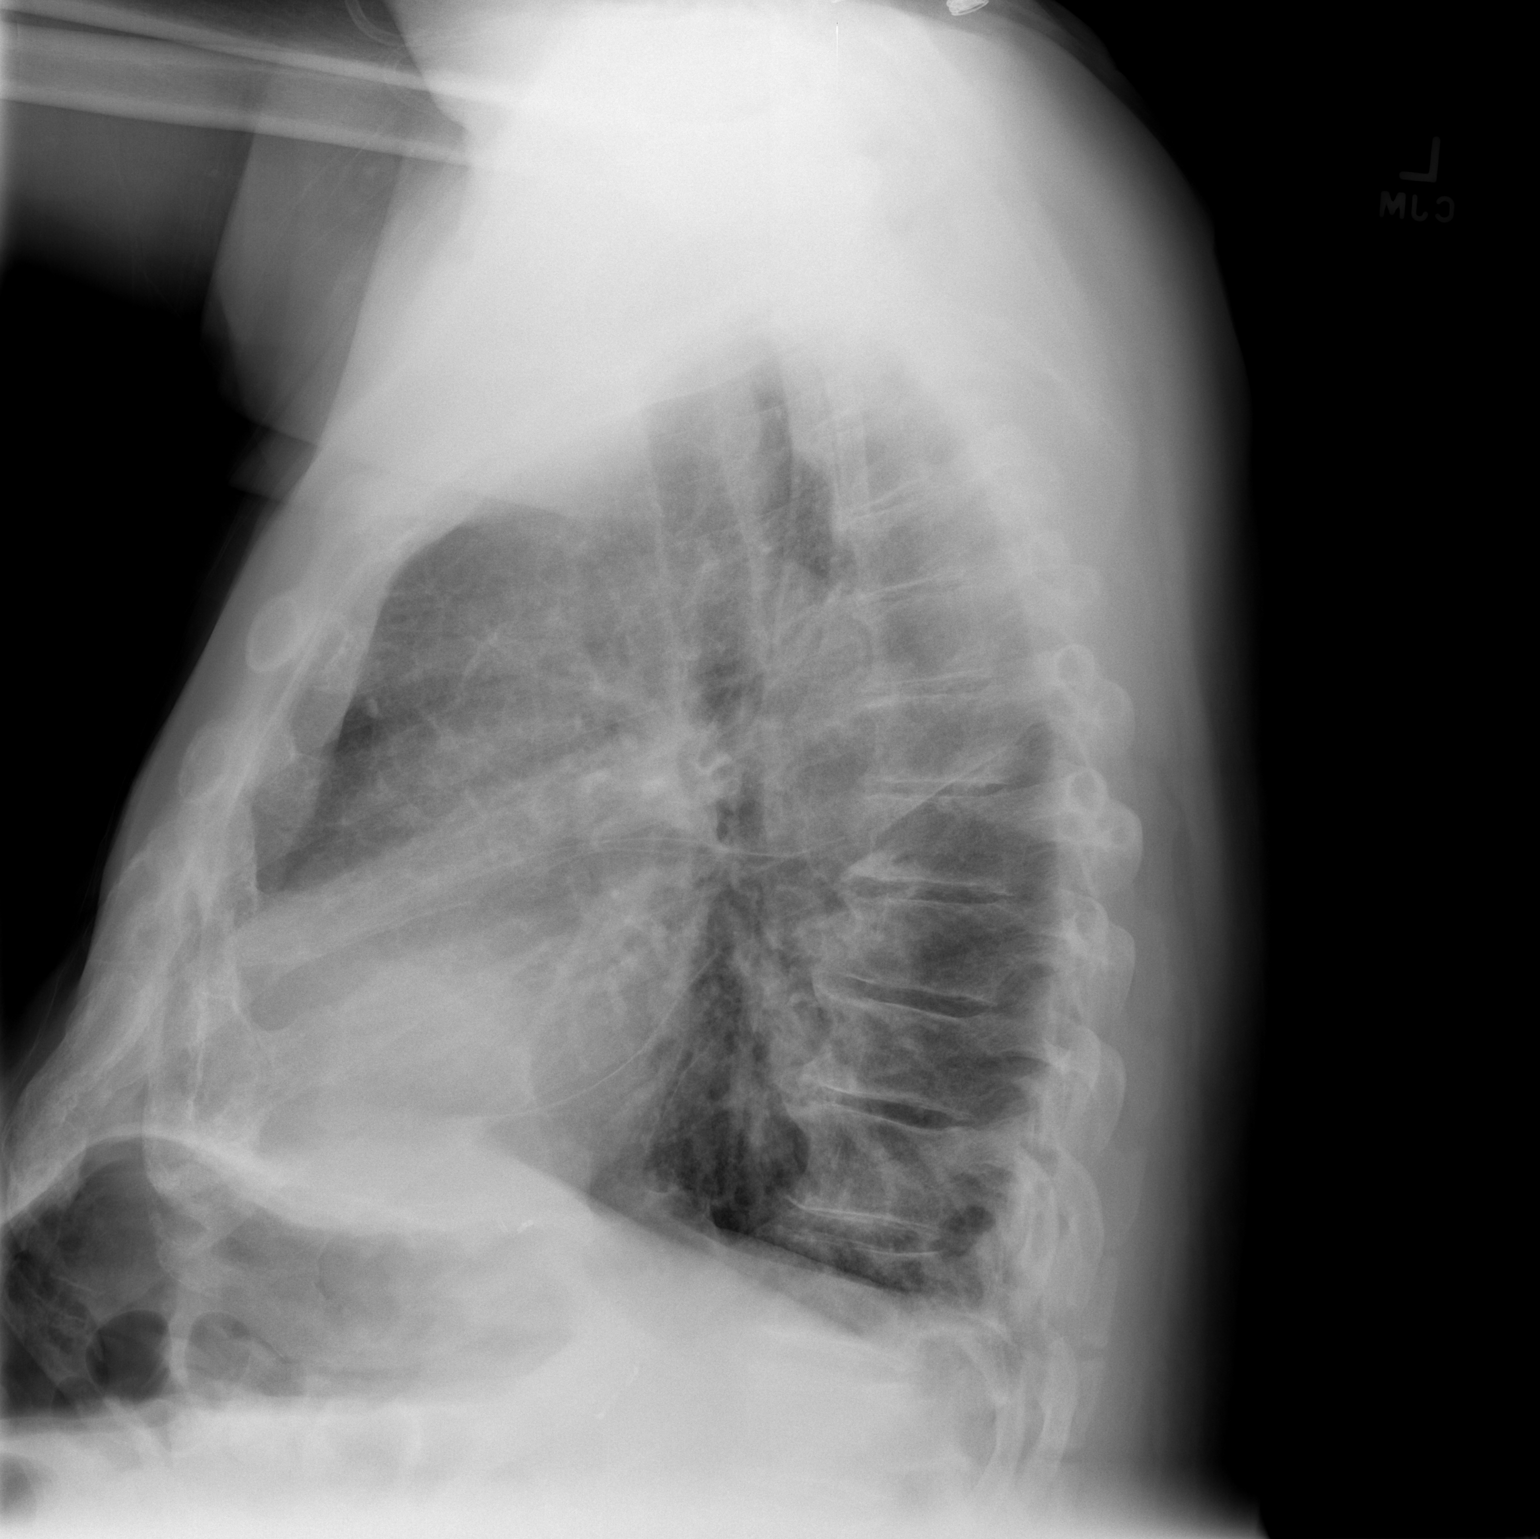

[2 of 2 positions shown; findings below may reference images not displayed]

FINDINGS: Stable enlargement of the cardiopericardial silhouette compared to
prior. Mediastinal contours are within normal limits. Chronic
pleural thickening is present along the right lateral chest. There
is a fine interstitial pattern and the right lung. This may
represent pneumonia or asymmetric pulmonary edema. Asymmetric
pulmonary edema is favored. Emphysema is present with flattening of
the hemidiaphragms. Surgical clips are identified in the upper
abdomen.
IMPRESSION: Cardiomegaly with interstitial prominence in the right chest, likely
representing asymmetric interstitial pulmonary edema. Infection is
considered less likely.

## 2014-09-15 IMAGING — CR DG CHEST 1V PORT
1 series · 1 of 1 positions shown · non-contrast
Comparison: CT 12/10/2012. Plain film 12/10/2012.

CLINICAL DATA: Laceration to left side.  Ethanol use.

EXAM:
PORTABLE CHEST - 1 VIEW

[AP]
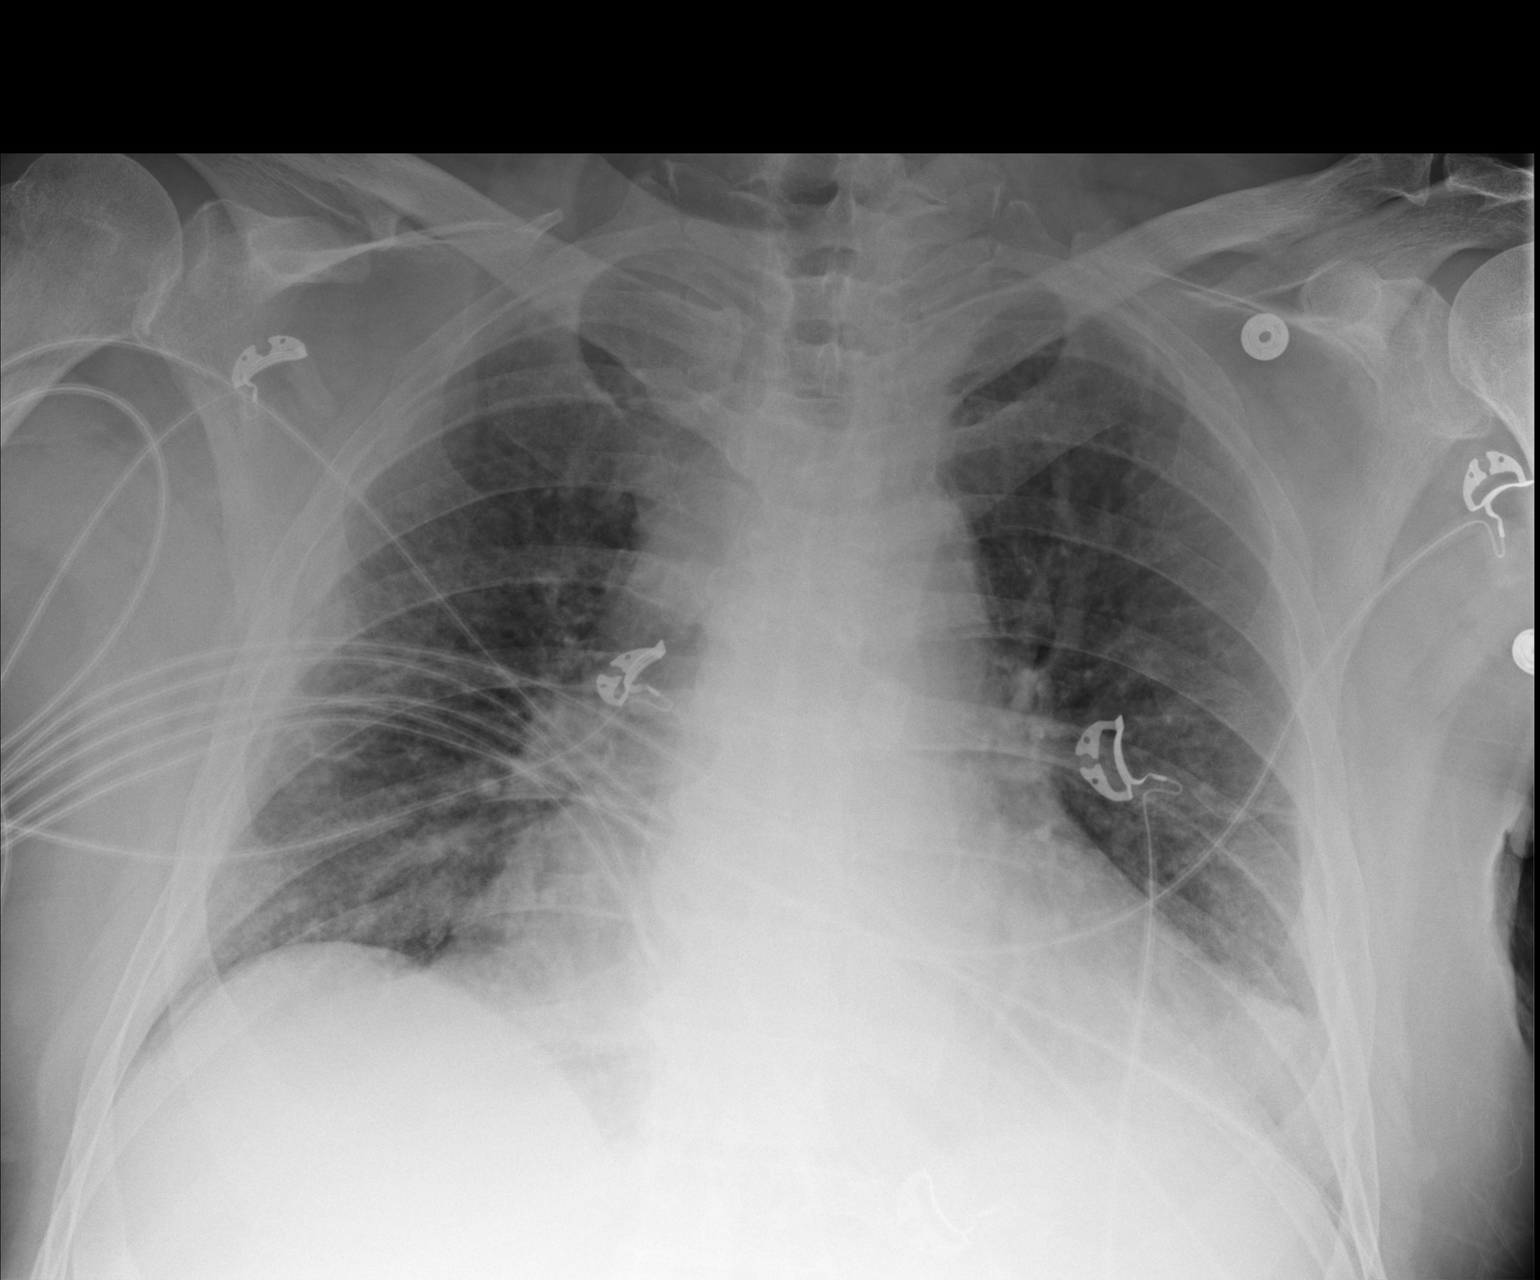

[1 of 1 positions shown; findings below may reference images not displayed]

FINDINGS: Midline trachea. Cardiomegaly accentuated by AP portable technique.
No a right and no definite left pleural effusion. Low lung volumes
with resultant pulmonary interstitial prominence. Given differences
in technique, similar right infrahilar and medial lung base airspace
disease. Worsened left base aeration which could be related to
developing infection or atelectasis.
IMPRESSION: No acute or posttraumatic deformity identified. Cardiomegaly with
similar right-sided basilar opacity. Favor atelectasis at the left
lung base. Consider radiographic followup to exclude developing
early infection.

## 2014-09-15 IMAGING — CT CT MAXILLOFACIAL W/O CM
3 series · 16 of 47 positions shown, 19 images · non-contrast
Comparison: None.

CLINICAL DATA: Initial encounter for trauma with laceration to the
left eye. Patient states alcohol consumption earlier today.

EXAM:
CT MAXILLOFACIAL WITHOUT CONTRAST
TECHNIQUE: Multidetector CT imaging of the maxillofacial structures was
performed. Multiplanar CT image reconstructions were also generated.
A small metallic BB was placed on the right temple in order to
reliably differentiate right from left.

[Series 2: facial bones · axial · 0.43mm/px · z∈[+55,+201]mm · 10 of 85 slices shown, 13 images]
[im 6/85  brain]
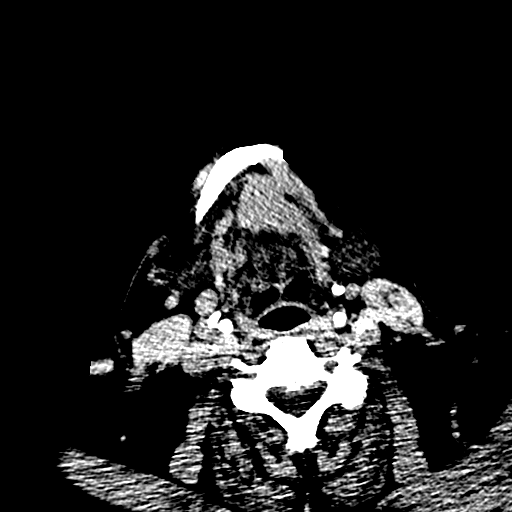
[im 6/85  bone]
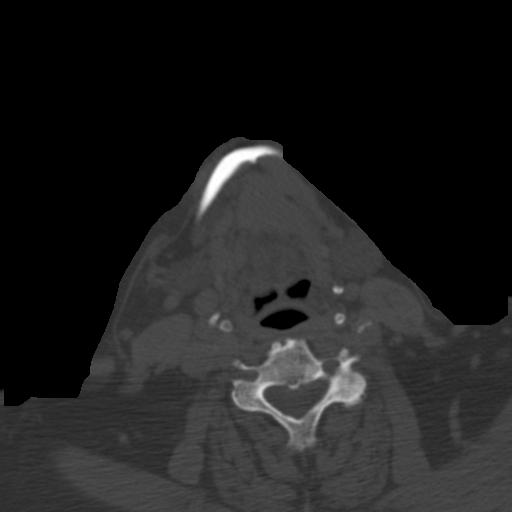
[im 15/85  bone]
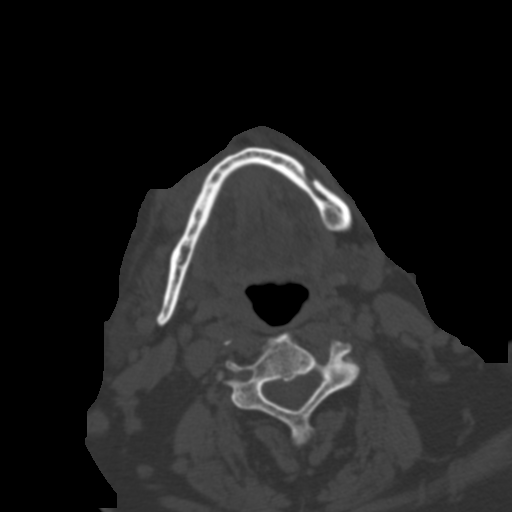
[im 24/85  bone]
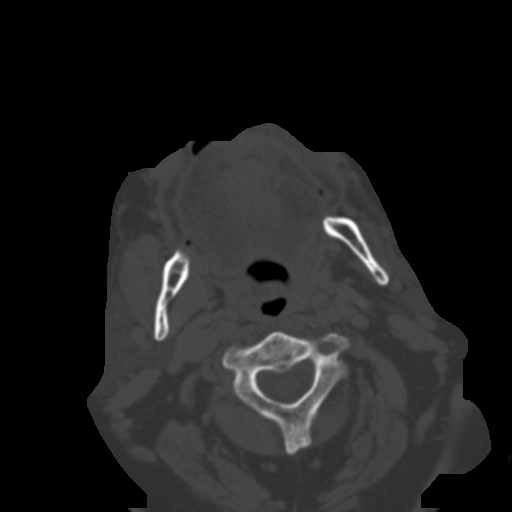
[im 29/85  bone]
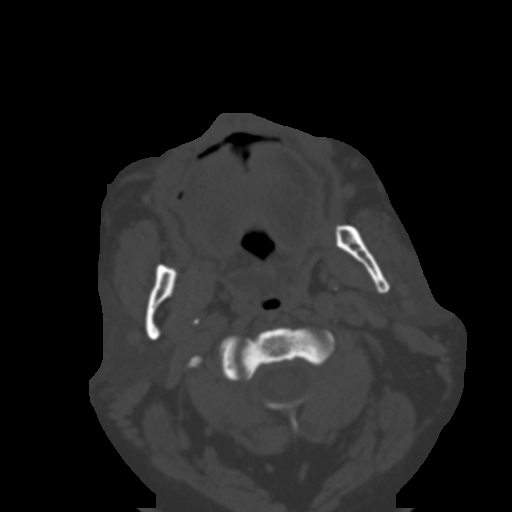
[im 38/85  brain]
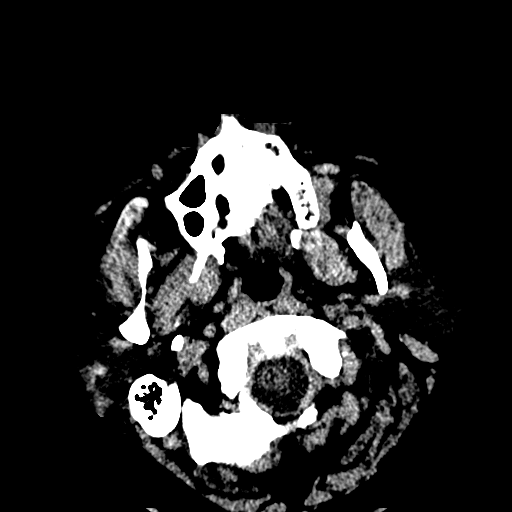
[im 38/85  bone]
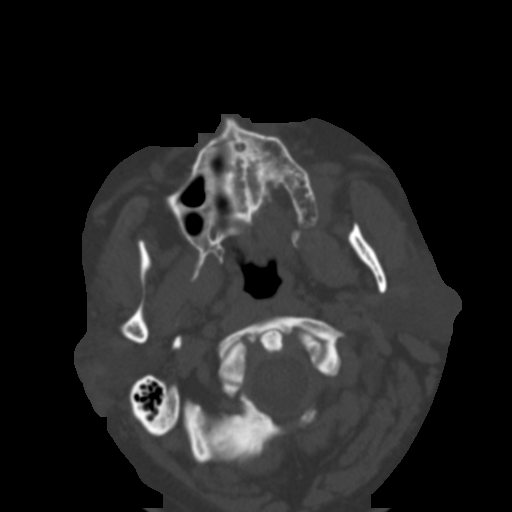
[im 47/85  bone]
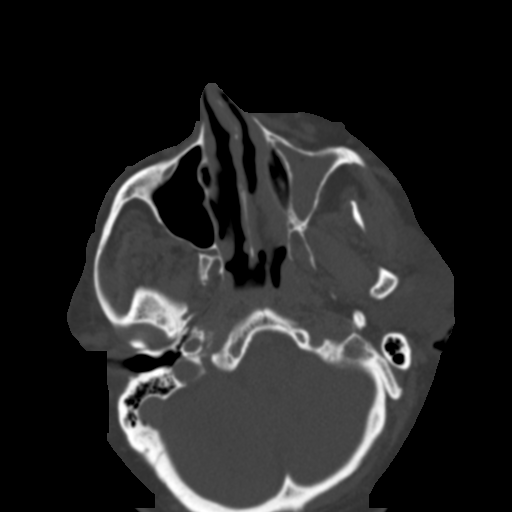
[im 56/85  bone]
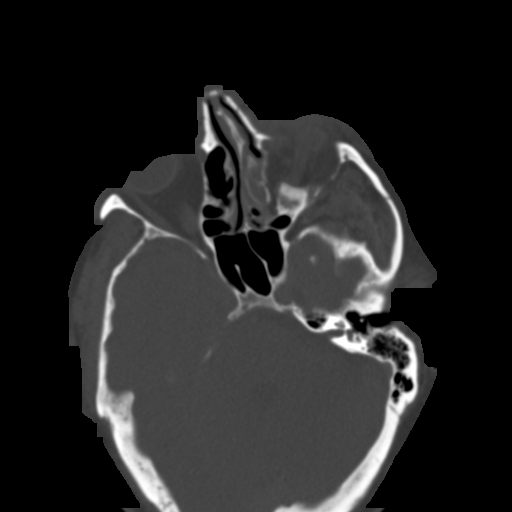
[im 64/85  bone]
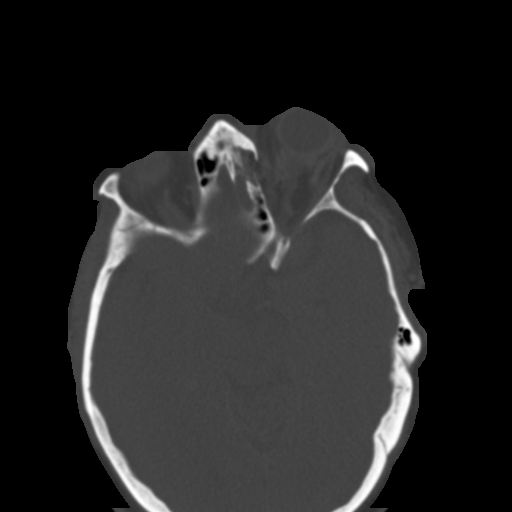
[im 70/85  brain]
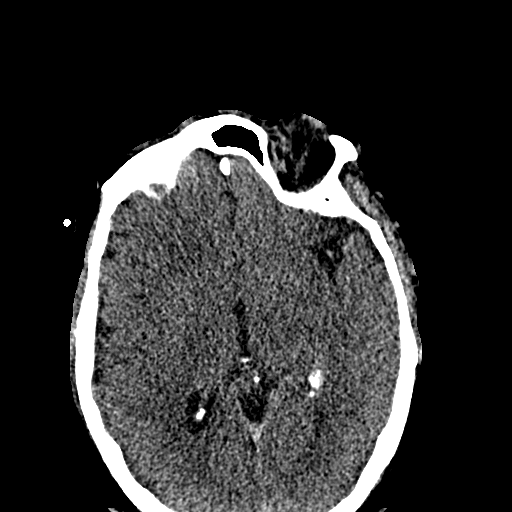
[im 70/85  bone]
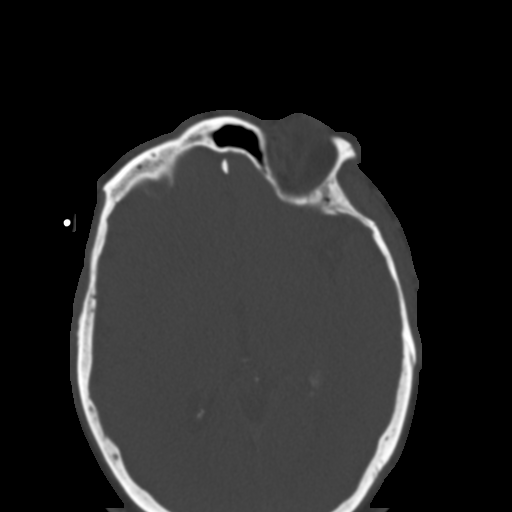
[im 79/85  bone]
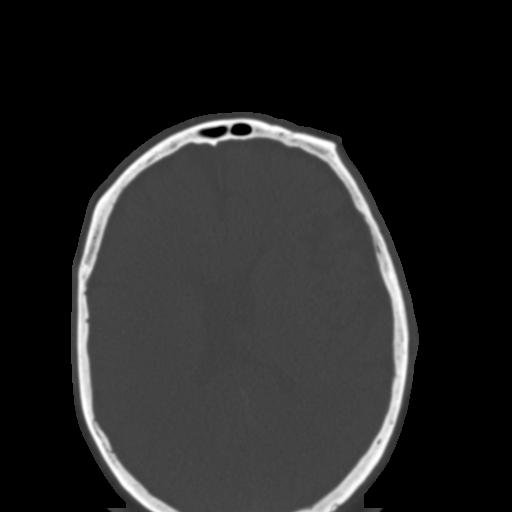

[Series 8044: mpr, coronal std, coronal · coronal · 0.43mm/px · 3 of 99 slices shown]
[im 33/99  bone]
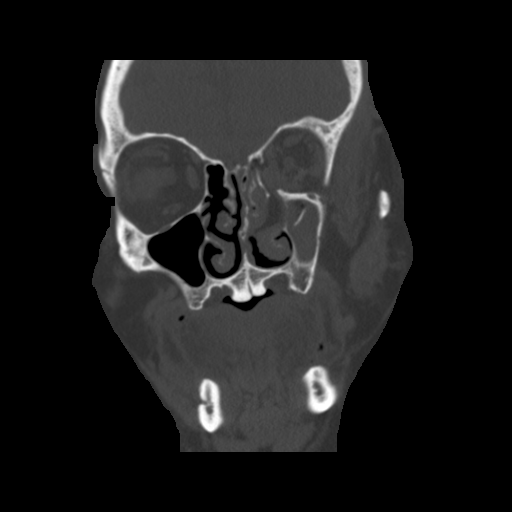
[im 44/99  bone]
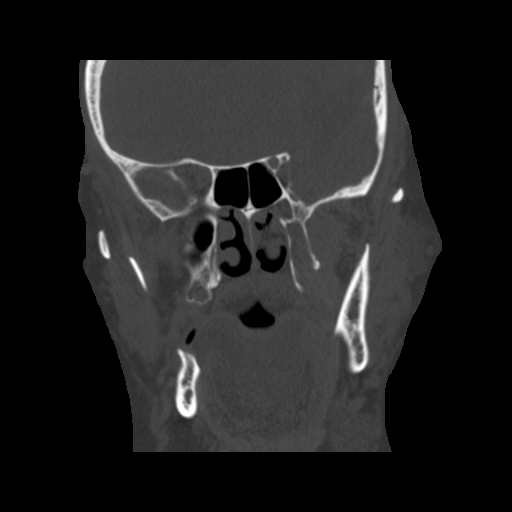
[im 55/99  bone]
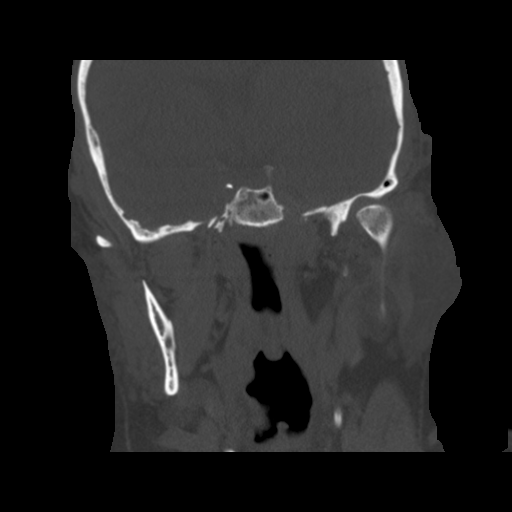

[Series 8045: mpr, sagittal std, sagittal · sagittal · 0.43mm/px · 3 of 85 slices shown]
[im 29/85  bone]
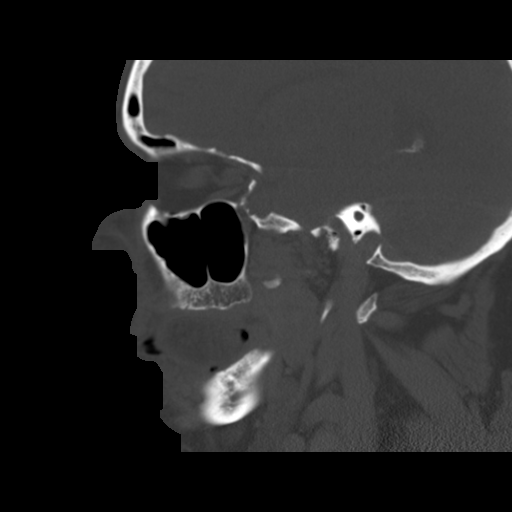
[im 43/85  bone]
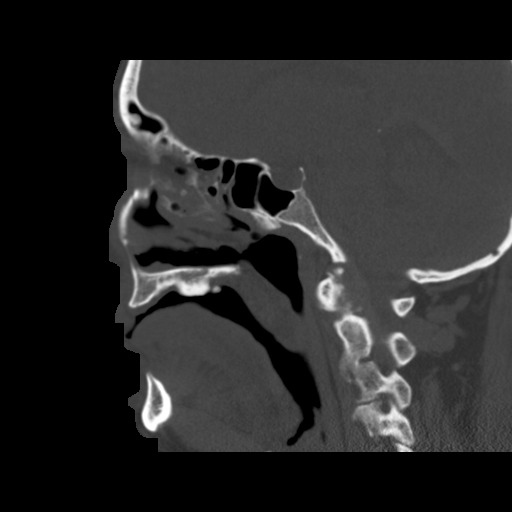
[im 57/85  bone]
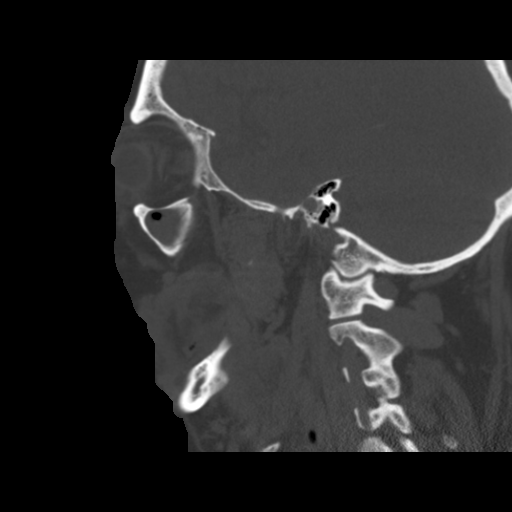

[16 of 47 positions shown; findings below may reference images not displayed]

FINDINGS: Preseptal soft tissue swelling/ecchymosis anterior to the left eye.
Hemorrhage within left orbit with slight proptosis. No evidence of
hemorrhage within the left globe.

Gas within the medial aspect of the left orbit, medial to the medial
rectus muscle, secondary to fractures of the medial wall of the
orbit. Left orbital floor fracture with displacement of the fragment
into the left maxillary sinus. Lateral orbital wall intact. No
evidence of inferior rectus muscle entrapment. Minimally displaced
fractures involving the tips of the nasal bones of indeterminate
age. Temporomandibular joints intact.

Opacification of left ethmoid air cells and the left maxillary sinus
related to the fractures. Mild mucosal thickening involving the left
frontal sinus. Remaining paranasal sinuses, bilateral mastoid air
cells, and bilateral middle ear cavities well aerated.
IMPRESSION: 1. Blowout fracture the left orbit, with fractures involving the
medial wall and the orbital floor.
2. Left intraorbital hemorrhage with proptosis.
3. Gas in the medial aspect of the left orbit, related to the medial
orbital wall fracture.
4. Preseptal soft tissue swelling/ecchymosis anterior to the left
eye.
5. Minimally displaced Fractures of the tips of the nasal bones of
indeterminate age.
6. Opacification of left ethmoid air cells and the left maxillary
sinus secondary to the fractures described above.

## 2014-09-15 IMAGING — CT CT CERVICAL SPINE W/O CM
4 of 5 series · 16 of 33 positions shown, 18 images · non-contrast
Comparison: Head CT 01/25/2008.

CLINICAL DATA: Fall.  Laceration to left eye.

EXAM:
CT HEAD WITHOUT CONTRAST
CT CERVICAL SPINE WITHOUT CONTRAST
TECHNIQUE: Multidetector CT imaging of the head and cervical spine was
performed following the standard protocol without intravenous
contrast. Multiplanar CT image reconstructions of the cervical spine
were also generated.

[Series 5: c_spine 2.0 i40s 3 · axial · 0.30mm/px · z∈[-280,-164]mm · 4 of 98 slices shown, 5 images]
[im 20/98  soft-tissue]
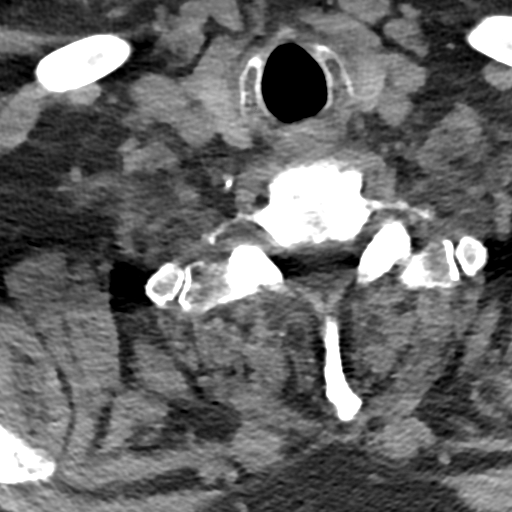
[im 20/98  bone]
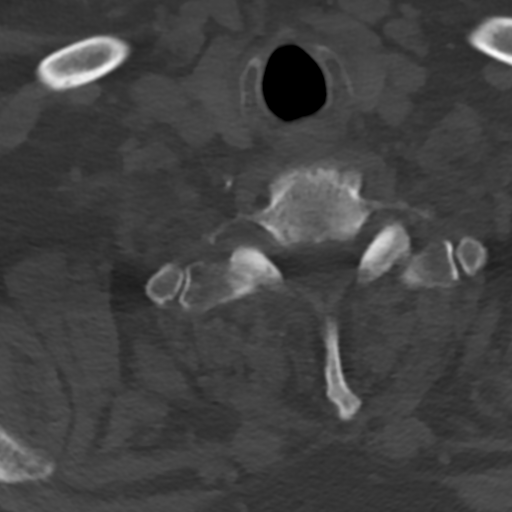
[im 39/98  bone]
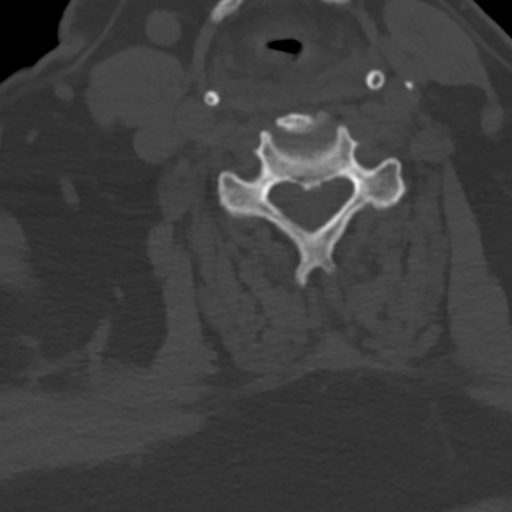
[im 59/98  bone]
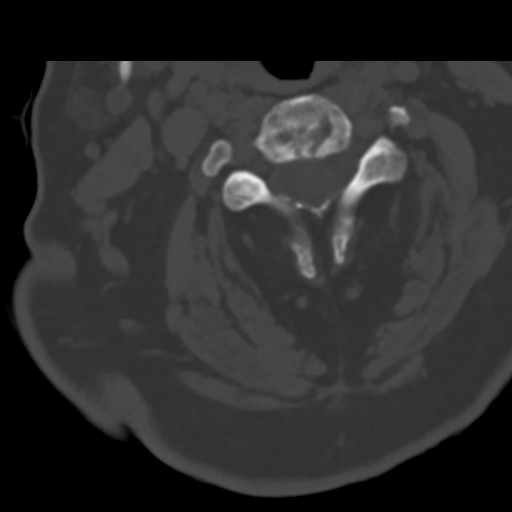
[im 78/98  bone]
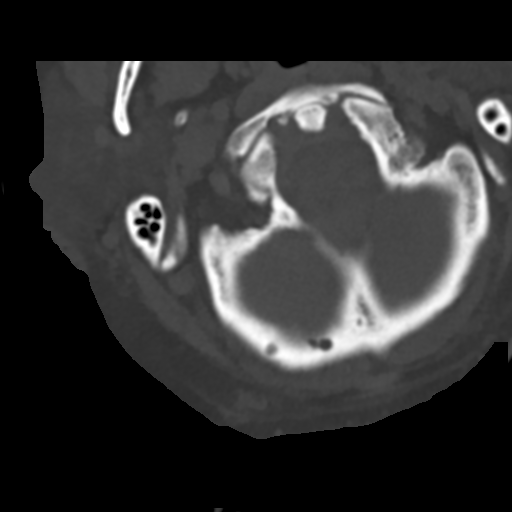

[Series 602: orthog · axial · 0.38mm/px · z∈[-298,-192]mm · 4 of 91 slices shown]
[im 19/91  bone]
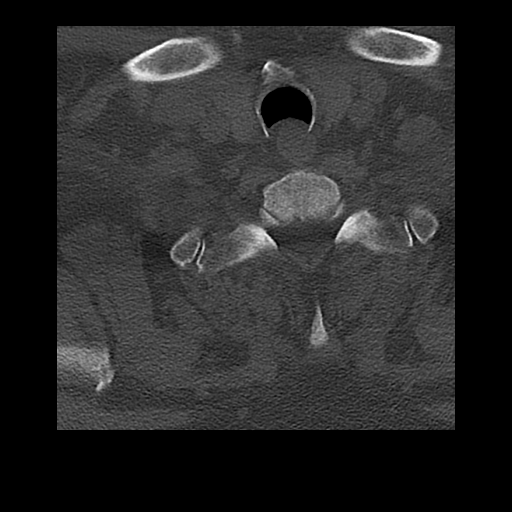
[im 37/91  bone]
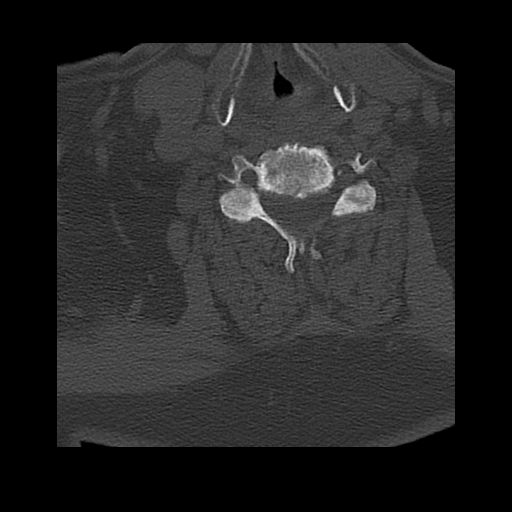
[im 55/91  bone]
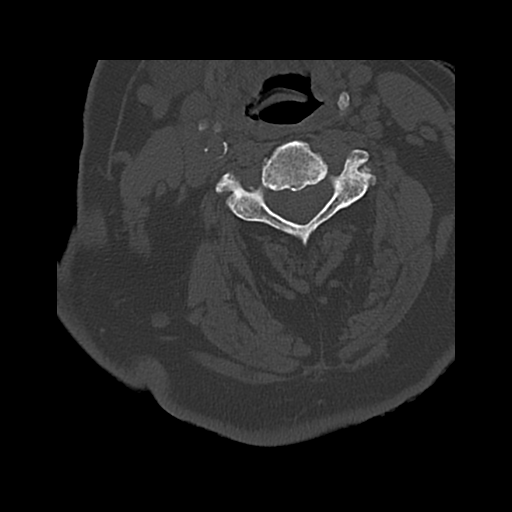
[im 73/91  bone]
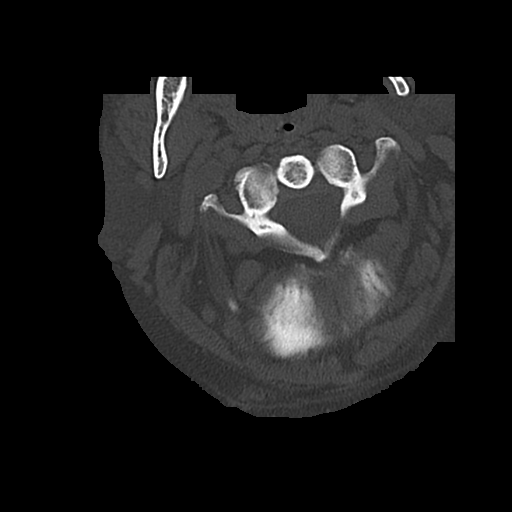

[Series 603: cor · coronal · 0.38mm/px · 3 of 42 slices shown]
[im 9/42  bone]
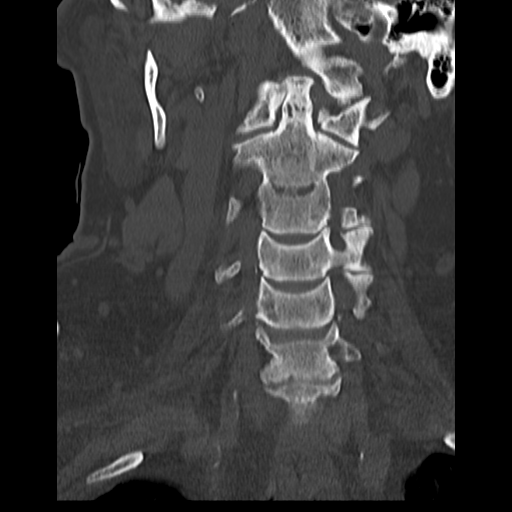
[im 17/42  bone]
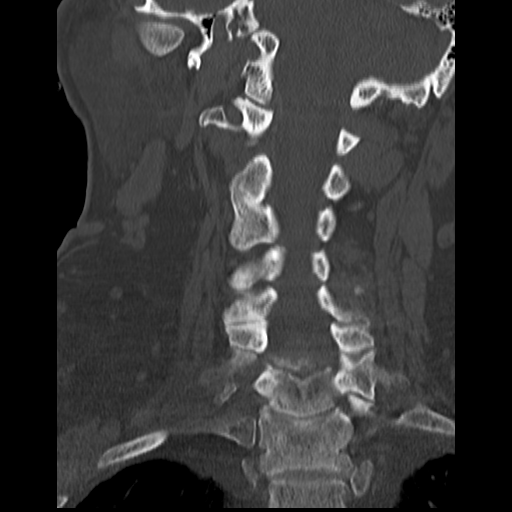
[im 25/42  bone]
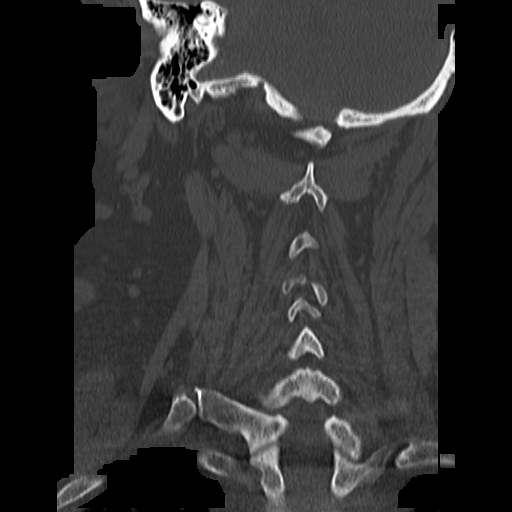

[Series 604: sag · sagittal · 0.38mm/px · 5 of 45 slices shown, 6 images]
[im 15/45  bone]
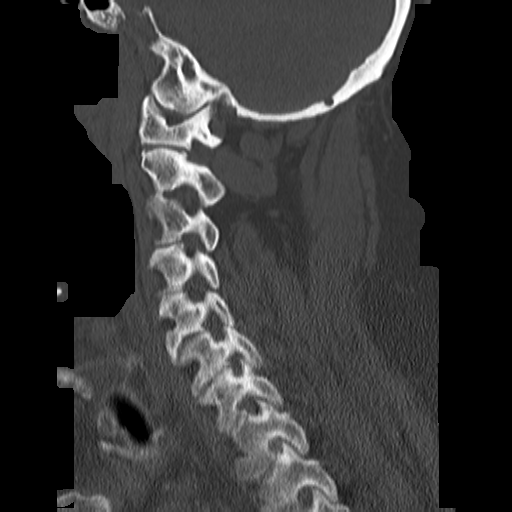
[im 19/45  bone]
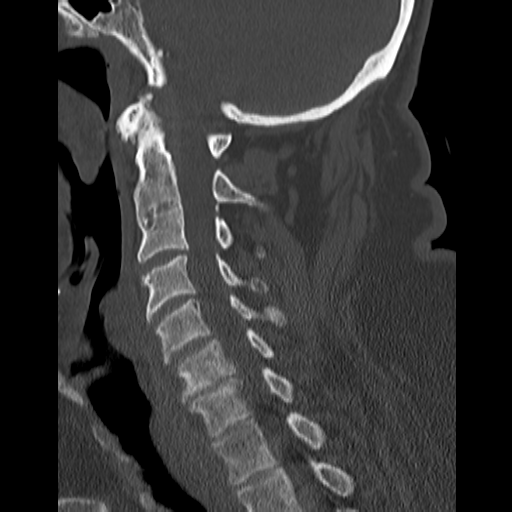
[im 23/45  soft-tissue]
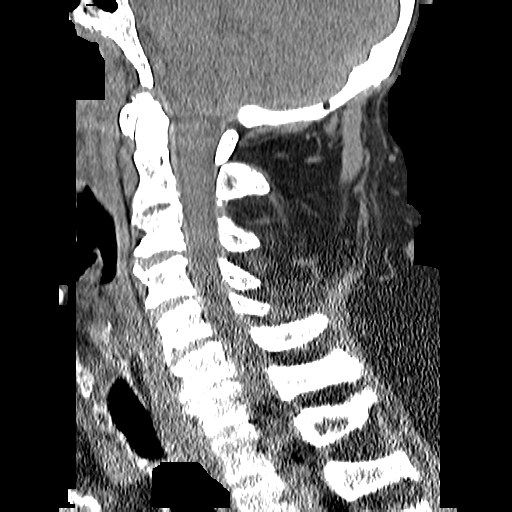
[im 23/45  bone]
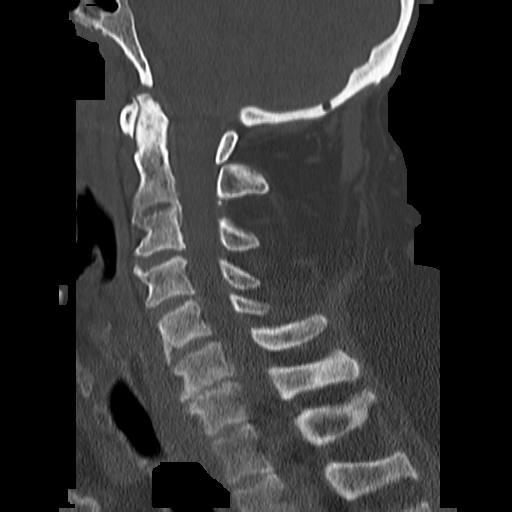
[im 26/45  bone]
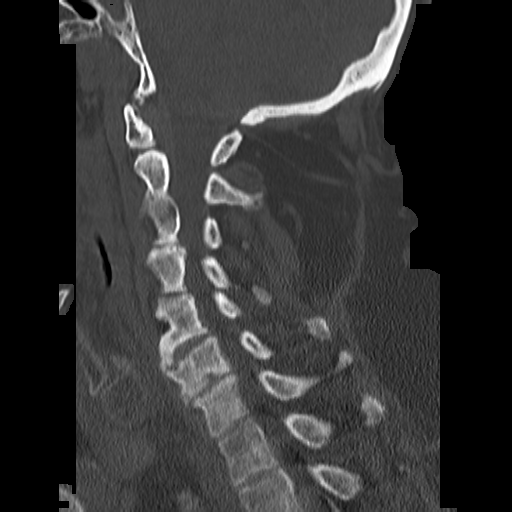
[im 30/45  bone]
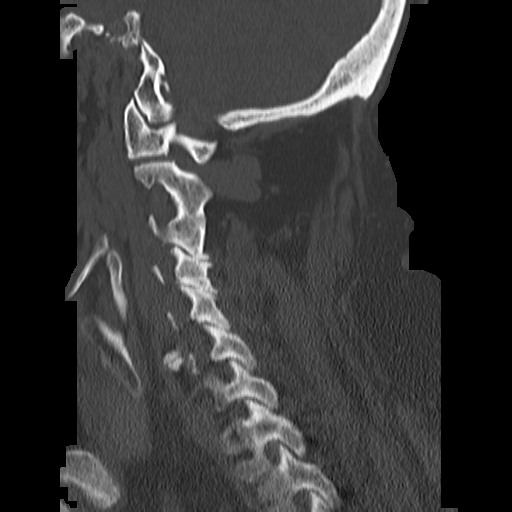

[16 of 33 positions shown; findings below may reference images not displayed]

FINDINGS: CT HEAD FINDINGS

The ventricles, cisterns and other CSF spaces are within normal.
There is no mass, mass effect, shift of midline structures or acute
hemorrhage. No evidence to suggest acute ischemia. Minimal asymmetry
of the sylvian fissures left greater than right unchanged. There
soft tissue swelling over the left periorbital region. There is mild
increased density within the left retrobulbar are space likely
hemorrhage. There is a comminuted displaced fracture of the medial
wall of the left orbit and possible left orbital floor fracture the
with moderate hemorrhagic debris within the left maxillary sinus and
adjacent ethmoid sinus.

CT CERVICAL SPINE FINDINGS

The vertebral body alignment and heights are within normal. There is
partial fusion of C2 and C3. There is disc space narrowing at the
C2-3 level and C6-7 level. There is mild to moderate spondylosis
throughout the cervical spine. Prevertebral soft tissues are within
normal. The atlantoaxial articulation is unremarkable. There is
uncovertebral joint spurring and facet arthropathy. There is no
acute fracture or subluxation.
IMPRESSION: No acute intracranial findings.  No acute cervical spine injury.

Displaced and comminuted fracture of the medial wall of the left
orbit as well as possible left orbital floor fracture. Associated
left periorbital soft tissue swelling with left retrobulbar
hemorrhage and hemorrhagic debris within the left maxillary and
ethmoid sinuses.

Spondylosis of the cervical spine with degenerative disc disease as
described. Partial fusion of C2 and C3.

## 2014-09-15 IMAGING — CT CT ABD-PELV W/ CM
2 of 5 series · 15 of 36 positions shown, 18 images · IV contrast (omnipaque)
Comparison: CT chest 12/10/2012, [DATE] and CT abdomen/ pelvis
03/14/2012.

CLINICAL DATA: Fall with left side trauma.

EXAM:
CT CHEST, ABDOMEN, AND PELVIS WITH CONTRAST
TECHNIQUE: Multidetector CT imaging of the chest, abdomen and pelvis was
performed following the standard protocol during bolus
administration of intravenous contrast.
CONTRAST:  100mL OMNIPAQUE IOHEXOL 300 MG/ML  SOLN

[Series 2: cap 5.0 i31f 1 · axial · 0.91mm/px · z∈[-926,-316]mm · 12 of 140 slices shown, 15 images]
[im 9/140  mediastinal]
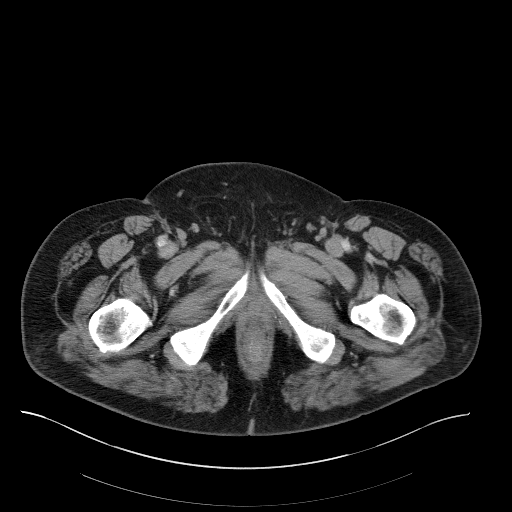
[im 9/140  lung]
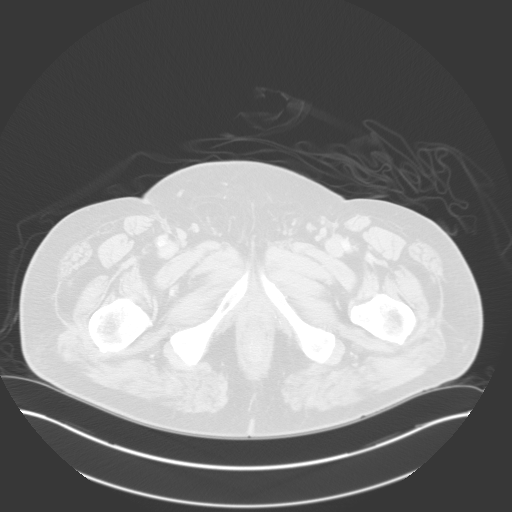
[im 18/140  lung]
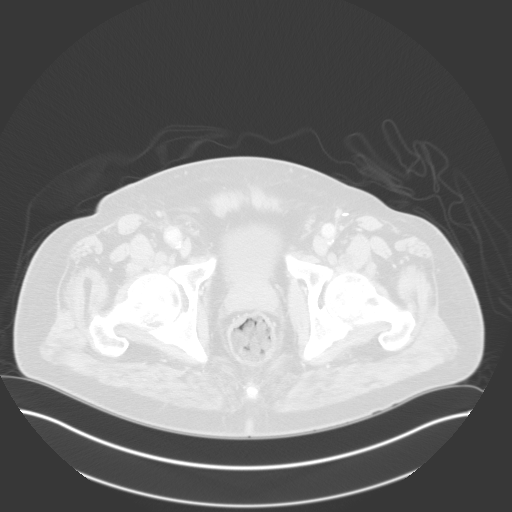
[im 35/140  lung]
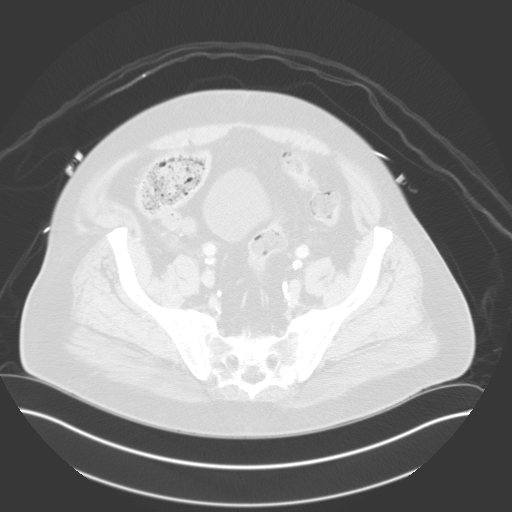
[im 44/140  lung]
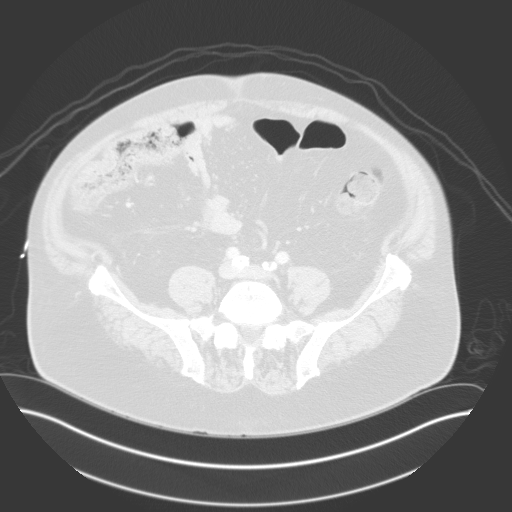
[im 53/140  mediastinal]
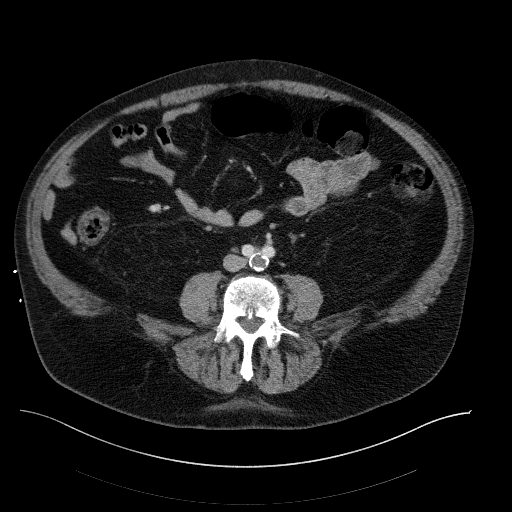
[im 53/140  lung]
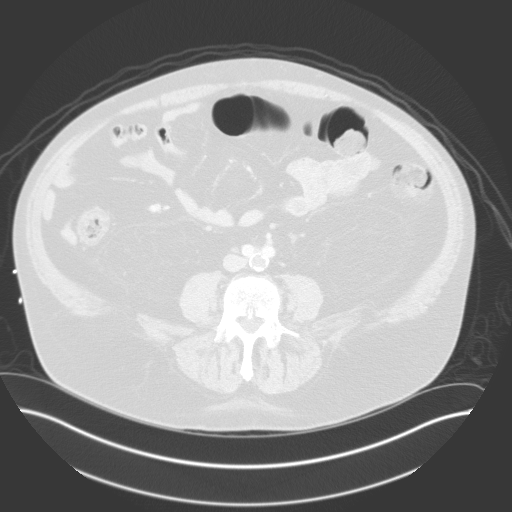
[im 61/140  lung]
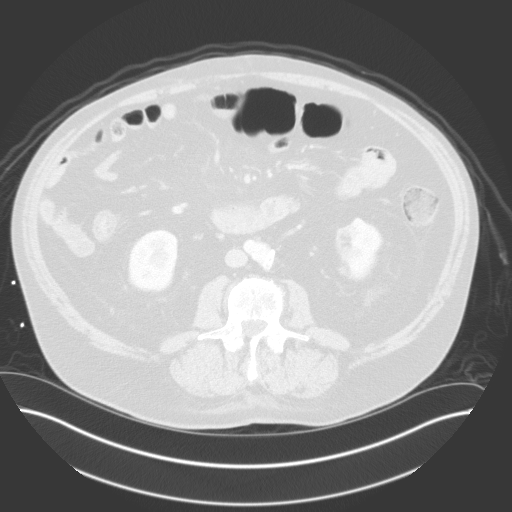
[im 79/140  lung]
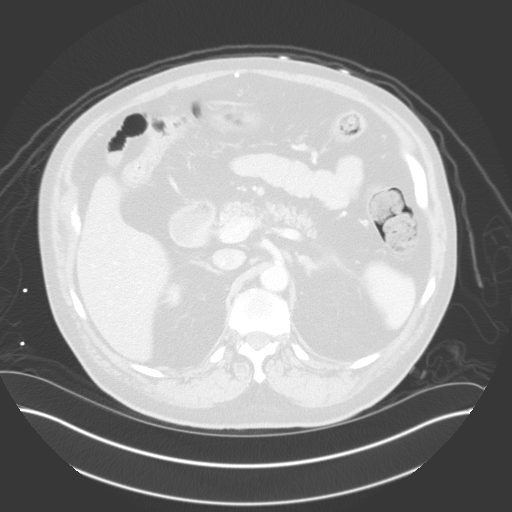
[im 87/140  lung]
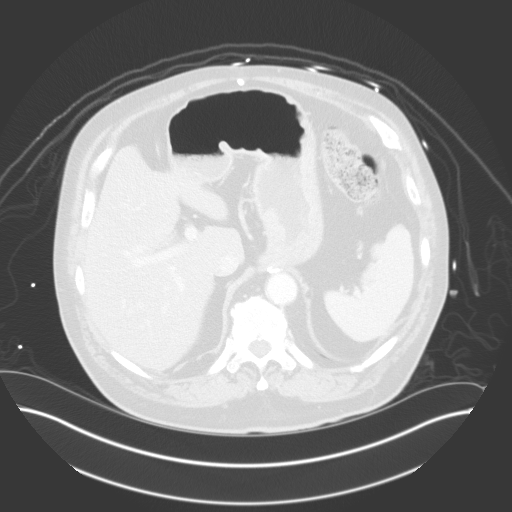
[im 96/140  mediastinal]
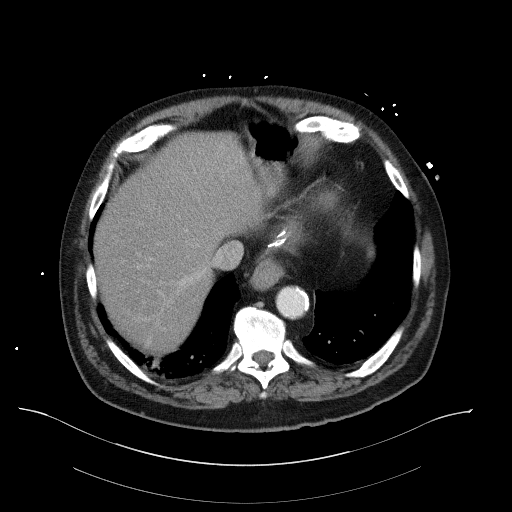
[im 96/140  lung]
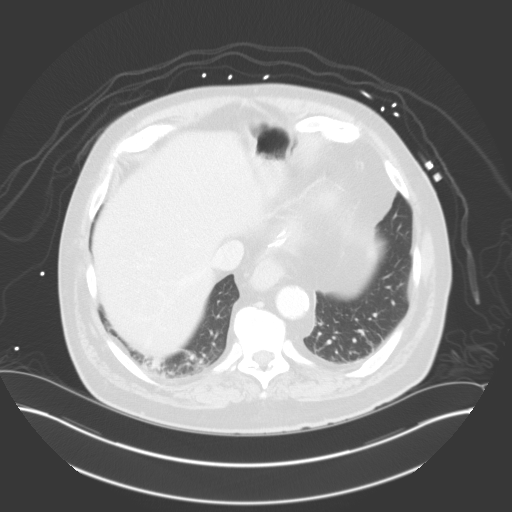
[im 105/140  lung]
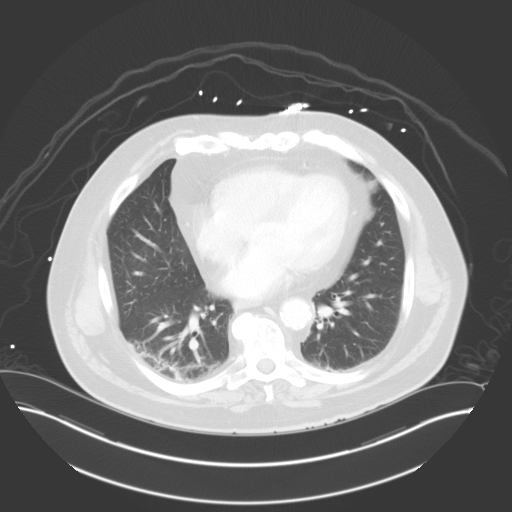
[im 122/140  lung]
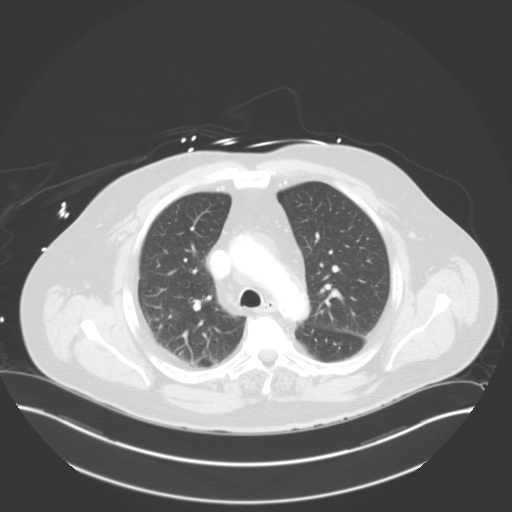
[im 131/140  lung]
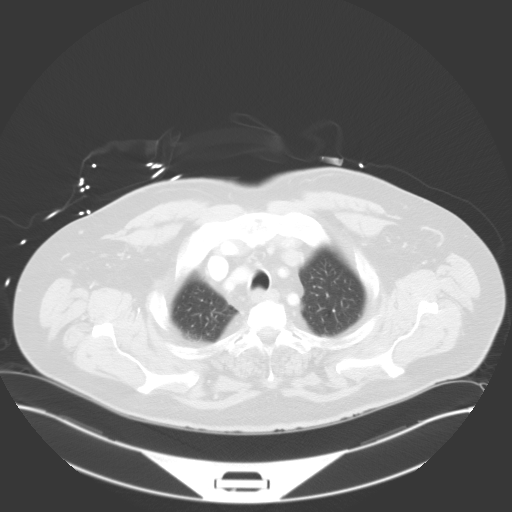

[Series 4: coronal · coronal · 1.36mm/px · 3 of 113 slices shown]
[im 23/113  lung]
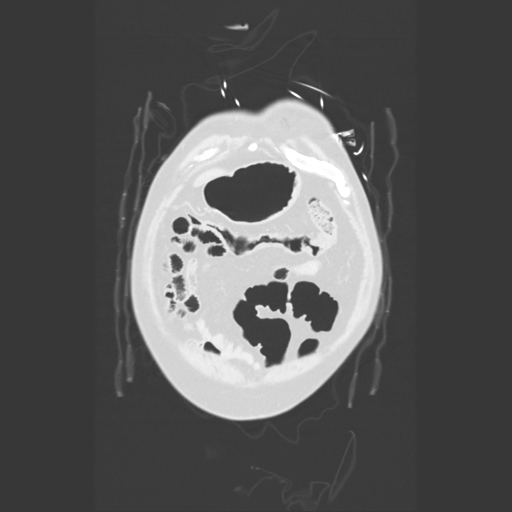
[im 45/113  lung]
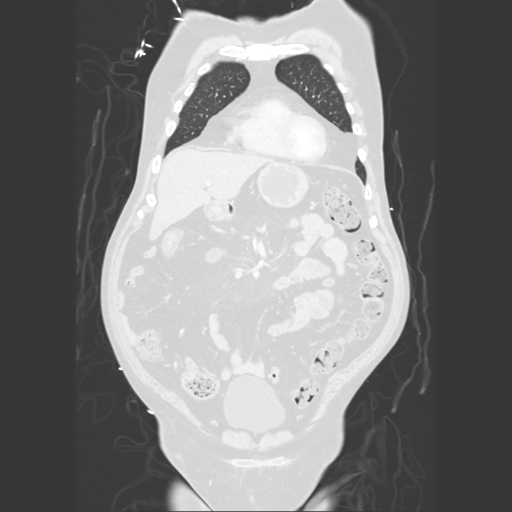
[im 68/113  lung]
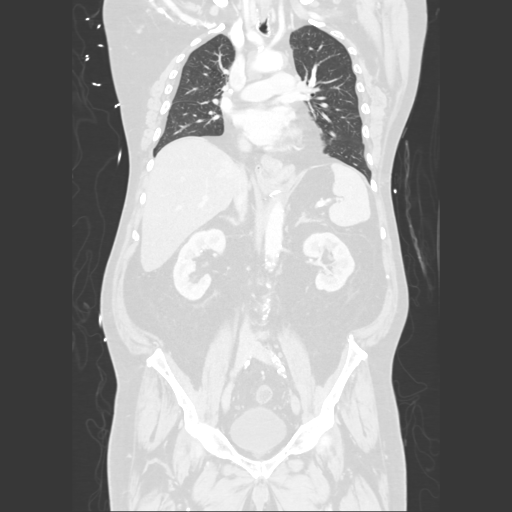

[15 of 36 positions shown; findings below may reference images not displayed]

FINDINGS: CT CHEST FINDINGS

Lungs are adequately inflated with mild dependent atelectasis over
the posterior right base. There is a calcified granuloma over the
anterior right upper lobe. The heart is unremarkable. There is mild
calcification of the thoracic aorta as remaining mediastinal
structures are unremarkable. There are a few small calcified right
hilar lymph nodes. There is an old right lateral rib fracture.

CT ABDOMEN AND PELVIS FINDINGS

Abdominal images demonstrate evidence of a prior cholecystectomy.
There is minimal fatty infiltration of the liver. There is a sub cm
left adrenal nodule likely an adenoma but too small to characterize.
Kidneys are normal in size without evidence of hydronephrosis or
focal mass. There are a few calcifications over the kidneys likely
vascular. There is mild calcified plaque involving the aorta and
iliac vessels. There is an intact patent aortobifemoral graft.
Ureters are within normal. The appendix is unremarkable. The
ascending colon is decompressed.

Remaining pelvic structures are unremarkable. Mild spondylosis of
the spine.
IMPRESSION: No acute findings in the chest, abdomen or pelvis.

Right basilar atelectasis.  Evidence of prior granulomatous disease.

Mild fatty infiltration of the liver. Sub cm left adrenal nodule
unchanged likely an adenoma.

Patent aortobifemoral bypass graft.

## 2014-09-15 IMAGING — CR DG PORTABLE PELVIS
1 series · 1 of 1 positions shown · non-contrast
Comparison: CT of 08/10/2012

CLINICAL DATA: Fall.

EXAM:
PORTABLE PELVIS 1-2 VIEWS

[AP]
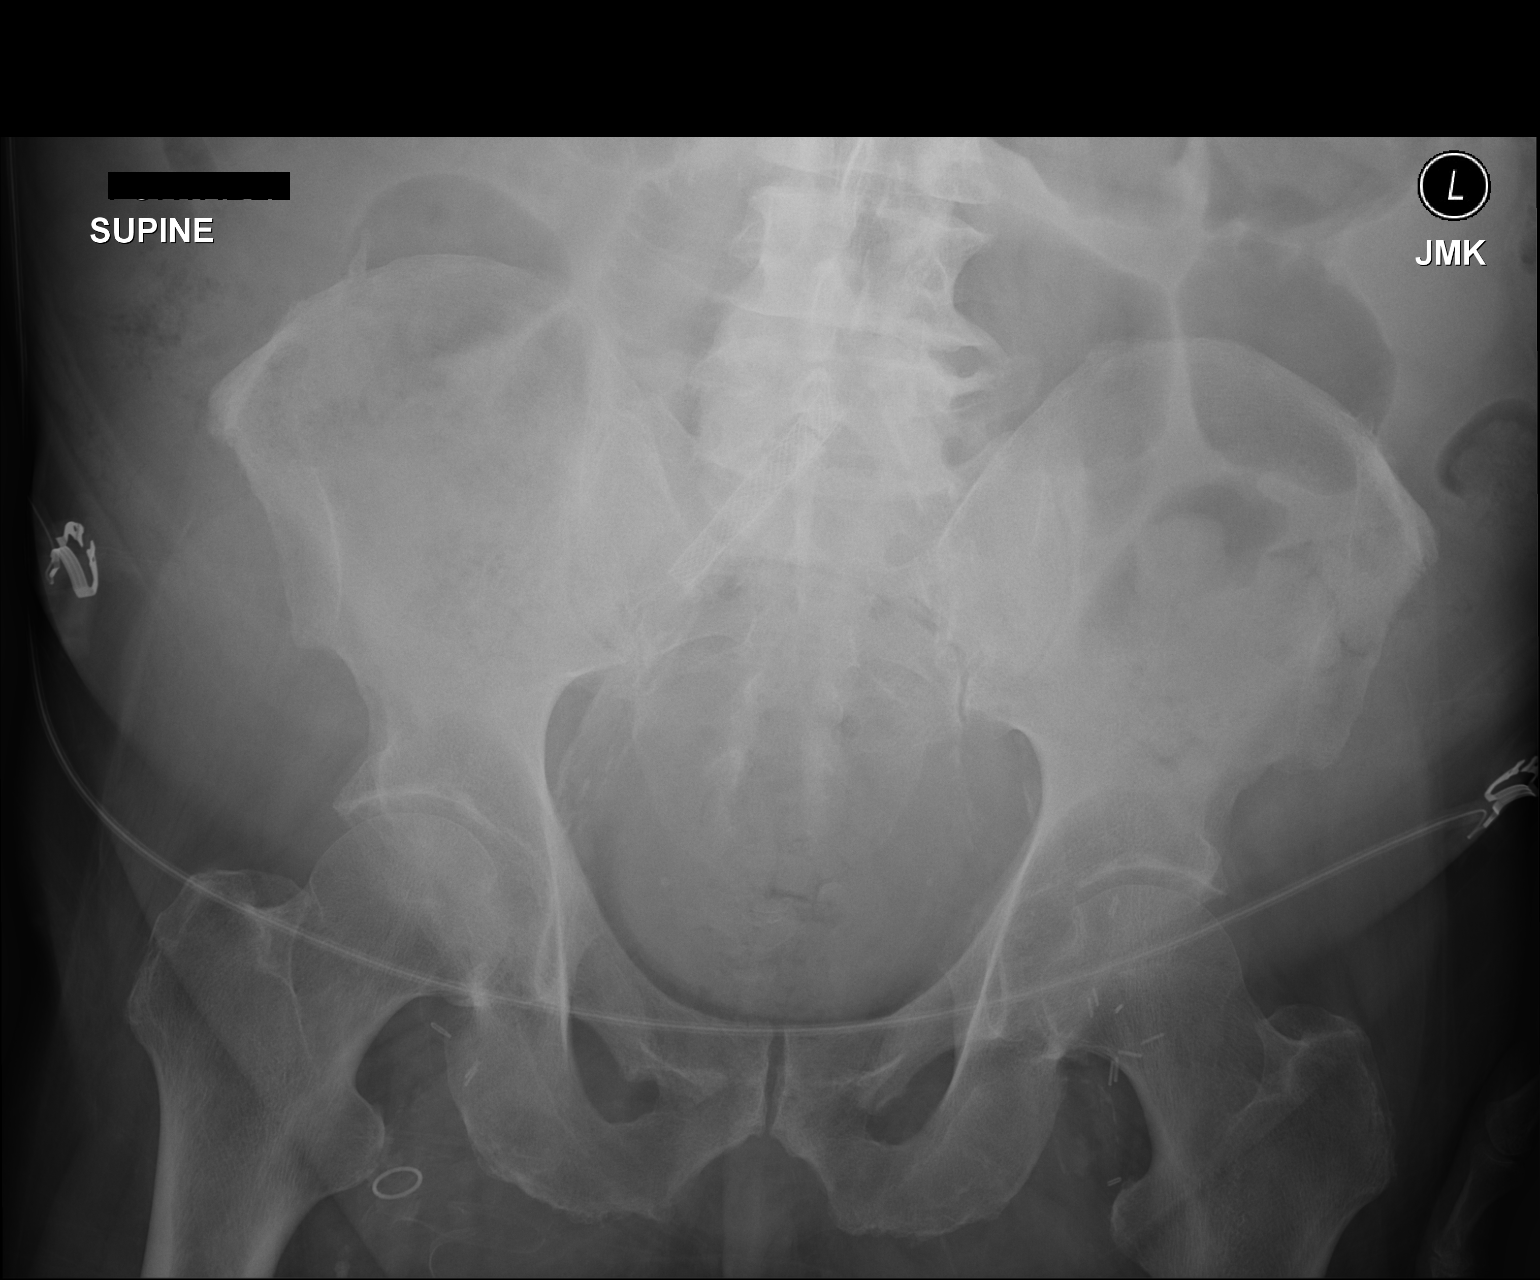

[1 of 1 positions shown; findings below may reference images not displayed]

FINDINGS: AP view of the pelvis. Surgical changes in both groins. Femoral
heads are located. Vascular stent in the right common and likely
left common iliac distribution. Extensive vascular calcifications.

No acute fracture.
IMPRESSION: No acute osseous abnormality. .

## 2014-09-18 ENCOUNTER — Ambulatory Visit: Payer: Medicare Other | Attending: Family Medicine | Admitting: Family Medicine

## 2014-09-18 ENCOUNTER — Encounter: Payer: Self-pay | Admitting: Family Medicine

## 2014-09-18 VITALS — BP 107/70 | HR 96 | Temp 97.4°F | Resp 18 | Ht 72.0 in | Wt 248.0 lb

## 2014-09-18 DIAGNOSIS — I10 Essential (primary) hypertension: Secondary | ICD-10-CM | POA: Insufficient documentation

## 2014-09-18 DIAGNOSIS — I2510 Atherosclerotic heart disease of native coronary artery without angina pectoris: Secondary | ICD-10-CM | POA: Diagnosis not present

## 2014-09-18 DIAGNOSIS — F17210 Nicotine dependence, cigarettes, uncomplicated: Secondary | ICD-10-CM | POA: Insufficient documentation

## 2014-09-18 DIAGNOSIS — J449 Chronic obstructive pulmonary disease, unspecified: Secondary | ICD-10-CM | POA: Diagnosis present

## 2014-09-18 DIAGNOSIS — I5033 Acute on chronic diastolic (congestive) heart failure: Secondary | ICD-10-CM | POA: Diagnosis not present

## 2014-09-18 DIAGNOSIS — I639 Cerebral infarction, unspecified: Secondary | ICD-10-CM | POA: Diagnosis not present

## 2014-09-18 LAB — BASIC METABOLIC PANEL
BUN: 12 mg/dL (ref 7–25)
CHLORIDE: 108 mmol/L (ref 98–110)
CO2: 30 mmol/L (ref 20–31)
CREATININE: 1.1 mg/dL (ref 0.70–1.25)
Calcium: 10.3 mg/dL (ref 8.6–10.3)
GLUCOSE: 82 mg/dL (ref 65–99)
POTASSIUM: 5.6 mmol/L — AB (ref 3.5–5.3)
Sodium: 150 mmol/L — ABNORMAL HIGH (ref 135–146)

## 2014-09-18 MED ORDER — LISINOPRIL 10 MG PO TABS: 10.0000 mg | ORAL_TABLET | Freq: Every day | ORAL | Status: DC

## 2014-09-18 MED ORDER — FUROSEMIDE 40 MG PO TABS: 40.0000 mg | ORAL_TABLET | Freq: Two times a day (BID) | ORAL | Status: DC

## 2014-09-18 MED ORDER — CLOPIDOGREL BISULFATE 75 MG PO TABS: 75.0000 mg | ORAL_TABLET | Freq: Every day | ORAL | Status: DC

## 2014-09-18 MED ORDER — METOPROLOL TARTRATE 25 MG PO TABS: 25.0000 mg | ORAL_TABLET | Freq: Two times a day (BID) | ORAL | Status: DC

## 2014-09-18 MED ORDER — MOMETASONE FURO-FORMOTEROL FUM 200-5 MCG/ACT IN AERO: 2.0000 | INHALATION_SPRAY | Freq: Two times a day (BID) | RESPIRATORY_TRACT | Status: DC

## 2014-09-18 MED ORDER — TRAMADOL HCL 50 MG PO TABS: 50.0000 mg | ORAL_TABLET | Freq: Three times a day (TID) | ORAL | Status: DC | PRN

## 2014-09-18 NOTE — Progress Notes (Addendum)
[name], is a 64 y.o. male  OIN:[id number]  BSJ:[id number]  DOB - 10/05/1950  CC:  Chief Complaint  Patient presents with  . Hospitalization Follow-up  . Cerebrovascular Accident       HPI: [name] is a 64 y.o. male here today to establish medical care. Past medical history is notable for hypertension, CAD, CVA, PAD, DVT/PE with a recent hospitalization at [facility] from 08/26/14-08/29/14 where he was managed for a CVA after he had presented to the ED with left arm weakness, numbness and drooling with right frontal headache. He was seen by neurology and did not receive TPA given last unknown last time well; CT head revealed no acute intracranial pathology but MRI brain revealed new subcentimeter acute infarct right corpus callosum plus some acute on chronic right frontal lobe infarcts. Started on ASA 325 mg, Plavix 75 mg and Atorvastatin 80 mg daily.  During his hospital course he was evaluated by speech pathology, occupational therapy; his symptoms resolved prior to discharge and He was also seen by cardiology and due to a concern for PFO and thrombotic event and TEE revealed mild MR, EF 55%, no left atrial thrombus, moderate neural aortic debris, no ASD/PFO; he was subsequently discharged and he was also scheduled for outpatient OT.  Interval history: He was previously seen by Dr. Wend[facility] Sn[facility] but he no longer goes there anymore. He complains of pain in his right knee and right hip and is requesting something for pain as he was previously receiving chronic narcotics.he also complains of bilateral pedal edema and dyspnea  Which is his baseline  Patient has No headache, No chest pain, No abdominal pain - No Nausea, No new weakness tingling or numbness, No Cough - SOB.  Allergies  Allergen Reactions  . Darvocet [Propoxyphene N-Acetaminophen] Hives  . Haldol [Haloperidol Decanoate] Hives   Past Medical History  Diagnosis Date  . Depression   .  Hypertension   . Hyperlipidemia   . Myocardial infarction 2000  . CHF (congestive heart failure)   . COPD (chronic obstructive pulmonary disease)   . Peripheral vascular disease, unspecified     08/20/10 doppler: increase in right ABI post-op. Left ABI stable. S/P bi-fem bypass surgery  . GERD (gastroesophageal reflux disease)   . Asthma   . Anxiety   . History of DVT of lower extremity   . CAD (coronary artery disease) 05/27/10    Cath: severe single vessell CAD left cx midportion obtuse marginal 2 to 3.  . Shortness of breath   . Pneumonia 04/04/2012  . Arthritis   . OSA on CPAP   . Hyperlipemia   . COPD (chronic obstructive pulmonary disease)   . Alcohol abuse     H/O  . Tobacco abuse   . Orbital fracture 12/2012  . Stroke    Current Outpatient Prescriptions on File Prior to Visit  Medication Sig Dispense Refill  . albuterol (PROVENTIL HFA;VENTOLIN HFA) 108 (90 BASE) MCG/ACT inhaler Inhale 2 puffs into the lungs every 6 (six) hours as needed for wheezing or shortness of breath. 1 Inhaler 3  . albuterol (PROVENTIL) (2.5 MG/3ML) 0.083% nebulizer solution Take 2.5 mg by nebulization 2 (two) times daily as needed for wheezing or shortness of breath.    [name] aspirin EC 325 MG EC tablet Take 1 tablet (325 mg total) by mouth daily. (Patient not taking: Reported on 08/26/2014) 30 tablet 0  . atorvastatin (LIPITOR) 40 MG tablet Take 1 tablet (40 mg  total) by mouth daily at 6 PM. 30 tablet 0  . clopidogrel (PLAVIX) 75 MG tablet TAKE 1 TABLET BY MOUTH EVERY DAY 30 tablet 0  . clotrimazole-betamethasone (LOTRISONE) cream Apply 1 application topically 2 (two) times daily. 30 g 0  . FOLIC ACID PO Take 1 tablet by mouth daily.    [name] gabapentin (NEURONTIN) 100 MG capsule Take 1 capsule (100 mg total) by mouth 3 (three) times daily. 90 capsule 0  . mometasone-formoterol (DULERA) 200-5 MCG/ACT AERO Inhale 2 puffs into the lungs 2 (two) times daily. (Patient not taking: Reported on 06/18/2014) 13 g 3  .  nortriptyline (PAMELOR) 25 MG capsule Take 1 capsule (25 mg total) by mouth at bedtime. (Patient not taking: Reported on 06/18/2014) 30 capsule 1  . omeprazole (PRILOSEC) 20 MG capsule Take 1 capsule (20 mg total) by mouth daily. 30 capsule 11  . oxyCODONE-acetaminophen (PERCOCET) 5-325 MG per tablet Take 2 tablets by mouth every 6 (six) hours as needed for moderate pain. (Patient not taking: Reported on 06/18/2014) 12 tablet 0  . Pyridoxine HCl (VITAMIN B-6 PO) Take 1 tablet by mouth daily.    [name] Spacer/Aero-Holding Chambers (BREATHERITE COLL SPACER ADULT) MISC 1 Device by Does not apply route as needed (with inhaler). 1 each 0  . tiotropium (SPIRIVA) 18 MCG inhalation capsule Place 1 capsule (18 mcg total) into inhaler and inhale daily. 30 capsule 3  . vitamin B-12 (CYANOCOBALAMIN) 100 MCG tablet Take 100 mcg by mouth daily.    . vitamin E 200 UNIT capsule Take 200 Units by mouth daily.     No current facility-administered medications on file prior to visit.   Family History  Problem Relation Age of Onset  . Heart attack Mother   . Cirrhosis Father   . Heart failure Mother   . Heart failure Brother   . Cancer Brother    History   Social History  . Marital Status: Single    Spouse Name: N/A  . Number of Children: N/A  . Years of Education: N/A   Occupational History  . Not on file.   Social History Main Topics  . Smoking status: Current Every Day Smoker -- 1.00 packs/day for 50 years    Types: Cigars, Cigarettes    Start date: 02/09/1961  . Smokeless tobacco: Former Systems developer    Types: Chew     Comment: 2 ppd, full flavor  . Alcohol Use: Yes     Comment: occ  . Drug Use: No  . Sexual Activity: Yes   Other Topics Concern  . Not on file   Social History Narrative   Recovering alcoholic    Review of Systems: Constitutional: Negative for fever, chills, diaphoresis, activity change, appetite change and fatigue. HENT: Negative for ear pain, nosebleeds, congestion, facial swelling,  rhinorrhea, neck pain, neck stiffness and ear discharge.  Eyes: Negative for pain, discharge, redness, itching and visual disturbance. Respiratory: Negative for cough, choking, chest tightness, positive forshortness of breath, negative for wheezing and stridor.  Cardiovascular: Negative for chest pain, palpitations and positive forleg swelling. Gastrointestinal: Negative for abdominal distention. Genitourinary: Negative for dysuria, urgency, frequency, hematuria, flank pain, decreased urine volume, difficulty urinating and dyspareunia.  Musculoskeletal: see hpi  Neurological: Negative for dizziness, tremors, seizures, syncope, facial asymmetry, speech difficulty, positive for weakness,negative for light-headedness, numbness and headaches.  Hematological: Negative for adenopathy. Does not bruise/bleed easily. Skin: Negative for rash, ulcer. Psychiatric/Behavioral: Negative for hallucinations, behavioral problems, confusion, dysphoric mood, decreased concentration and agitation.  Objective: Filed Vitals:   09/18/14 0911  BP: 107/70  Pulse: 96  Temp: 97.4 F (36.3 C)  Resp: 18      Physical Exam: Constitutional: Patient appears well-developed and well-nourished. No distress. HENT: Normocephalic, atraumatic, External right and left ear normal. Oropharynx is clear and moist.  Eyes: Conjunctivae and EOM are normal. PERRLA, no scleral icterus. Neck: Normal ROM, No JVD. No tracheal deviation. No thyromegaly. CVS: RRR, S1/S2 +, no murmurs, no gallops, no carotid bruit., 3+bilateral pedal edema Pulmonary: Effort and breath sounds normal, no stridor, rhonchi, wheezes, rales.  Abdominal: Soft. BS +, no distension, tenderness, rebound or guarding.  Musculoskeletal: tenderness on ROM of knee and hip joints.  Lymphadenopathy: No lymphadenopathy noted, cervical, inguinal or axillary Neuro: Alert. Normal reflexes,motor strength is 5/5 in right upper and right lower extremity; 4/5 in left upper and  left lower extremity. Skin: Skin is warm and dry. No rash noted. Not diaphoretic. No erythema. No pallor. Psychiatric: Normal mood and affect. Behavior, judgment, thought content normal.  Lab Results  Component Value Date   WBC 7.7 08/26/2014   HGB 17.0 08/26/2014   HCT 50.0 08/26/2014   MCV 90.9 08/26/2014   PLT 175 08/26/2014   Lab Results  Component Value Date   CREATININE 1.30* 08/26/2014   BUN 19 08/26/2014   NA 137 08/26/2014   K 3.8 08/26/2014   CL 101 08/26/2014   CO2 23 08/26/2014    Lab Results  Component Value Date   HGBA1C 6.4* 08/02/2014   Lipid Panel     Component Value Date/Time   CHOL 130 08/02/2014 0445   TRIG 125 08/02/2014 0445   HDL 30* 08/02/2014 0445   CHOLHDL 4.3 08/02/2014 0445   VLDL 25 08/02/2014 0445   LDLCALC 75 08/02/2014 0445       Assessment and plan:  64 year old male here today to establish medical care. Past medical history is notable for hypertension, CAD, CVA, PAD, DVT/PE with a recent hospitalization at [facility] from 08/26/14-08/29/14 where he was managed for a CVA.  CVA; Mild residual left-sided weakness. High risk for falls, ambulates with the aid of a cane. OT as per schedule. Continue ASA '325mg'$   Acute on chronic CHF: EF 55% Due to pedal edema I am increasing the dose of his Lasix to 40 mg twice daily and will recheck his renal function at next visit. Daily weight checks, limit fluid intake to less than 2 L a day.  CAD: Continue Plavix and advised to keep appointment with cardiology.  COPD: No acute exacerbation. Continue Spiriva, Dulera and rescue inhaler.  This note has been created with Surveyor, quantity. Any transcriptional errors are unintentional.       The patient was given clear instructions to go to ER or return to medical center if symptoms don't improve, worsen or new problems develop. The patient verbalized understanding. The patient was told to call to get  lab results if they haven't heard anything in the next week.     Arnoldo Morale, [facility] [phone] 09/18/2014, 9:18 AM

## 2014-09-18 NOTE — Patient Instructions (Signed)

## 2014-09-18 NOTE — Progress Notes (Signed)
Pt recently hospitalized for CVA right corpus callosum Pt c/o chronic right foot pain 10/10 asking for oxycodone pt was explained our narcotic policy He was + for cocaine , opiates, benzos while in the hospital.

## 2014-09-19 ENCOUNTER — Other Ambulatory Visit: Payer: Self-pay | Admitting: Family Medicine

## 2014-09-19 ENCOUNTER — Telehealth: Payer: Self-pay | Admitting: *Deleted

## 2014-09-19 DIAGNOSIS — J449 Chronic obstructive pulmonary disease, unspecified: Secondary | ICD-10-CM

## 2014-09-19 LAB — PRO B NATRIURETIC PEPTIDE: PRO B NATRI PEPTIDE: 95.95 pg/mL (ref <126)

## 2014-09-19 NOTE — Telephone Encounter (Signed)
-----   Message from Arnoldo Morale, MD sent at 09/19/2014  8:19 AM EDT ----- Labs reveal hyperkalemia, will repeat at next office visit.

## 2014-09-20 ENCOUNTER — Telehealth: Payer: Self-pay | Admitting: *Deleted

## 2014-09-20 NOTE — Telephone Encounter (Signed)
Left HIPAA compliant message for patient to return my call-This call was to let him know about his hyperkalemia

## 2014-10-02 ENCOUNTER — Ambulatory Visit: Payer: Medicare Other | Attending: Family Medicine | Admitting: Family Medicine

## 2014-10-02 ENCOUNTER — Encounter: Payer: Self-pay | Admitting: Family Medicine

## 2014-10-02 VITALS — BP 161/84 | HR 86 | Temp 98.0°F | Resp 16 | Ht 72.0 in | Wt 247.0 lb

## 2014-10-02 DIAGNOSIS — I739 Peripheral vascular disease, unspecified: Secondary | ICD-10-CM | POA: Diagnosis not present

## 2014-10-02 DIAGNOSIS — Z720 Tobacco use: Secondary | ICD-10-CM | POA: Diagnosis not present

## 2014-10-02 DIAGNOSIS — I10 Essential (primary) hypertension: Secondary | ICD-10-CM | POA: Diagnosis not present

## 2014-10-02 DIAGNOSIS — I639 Cerebral infarction, unspecified: Secondary | ICD-10-CM | POA: Diagnosis not present

## 2014-10-02 DIAGNOSIS — E875 Hyperkalemia: Secondary | ICD-10-CM

## 2014-10-02 DIAGNOSIS — G2581 Restless legs syndrome: Secondary | ICD-10-CM

## 2014-10-02 DIAGNOSIS — I509 Heart failure, unspecified: Secondary | ICD-10-CM | POA: Insufficient documentation

## 2014-10-02 DIAGNOSIS — I2510 Atherosclerotic heart disease of native coronary artery without angina pectoris: Secondary | ICD-10-CM | POA: Diagnosis not present

## 2014-10-02 DIAGNOSIS — J449 Chronic obstructive pulmonary disease, unspecified: Secondary | ICD-10-CM | POA: Diagnosis not present

## 2014-10-02 MED ORDER — ATORVASTATIN CALCIUM 40 MG PO TABS: 40.0000 mg | ORAL_TABLET | Freq: Every day | ORAL | Status: DC

## 2014-10-02 MED ORDER — TRAMADOL HCL 50 MG PO TABS: 50.0000 mg | ORAL_TABLET | Freq: Three times a day (TID) | ORAL | Status: DC | PRN

## 2014-10-02 MED ORDER — TIOTROPIUM BROMIDE MONOHYDRATE 18 MCG IN CAPS: 18.0000 ug | ORAL_CAPSULE | Freq: Every day | RESPIRATORY_TRACT | Status: DC

## 2014-10-02 MED ORDER — CYCLOBENZAPRINE HCL 7.5 MG PO TABS: 7.5000 mg | ORAL_TABLET | Freq: Three times a day (TID) | ORAL | Status: DC | PRN

## 2014-10-02 MED ORDER — FUROSEMIDE 40 MG PO TABS: 40.0000 mg | ORAL_TABLET | Freq: Two times a day (BID) | ORAL | Status: DC

## 2014-10-02 NOTE — Progress Notes (Signed)
Patient here for follow up on his hypertension and DVT/PE Patient also requesting refills on his medications including lasix

## 2014-10-02 NOTE — Patient Instructions (Signed)

## 2014-10-02 NOTE — Progress Notes (Signed)
Subjective:    Patient ID: [name], male    DOB: March 16, 1950, 64 y.o.   MRN: [id number]  HPI  64 year old male here today to establish medical care. Past medical history is notable for hypertension, CAD, CVA, PAD, DVT/PE with a recent hospitalization at [facility] from 08/26/14-08/29/14 where he was managed for a CVA. Blood pressure is elevated and he endorses compliance with medications.  He complains of persisting pain in his bilateral lower extremities while walking which is improved at rest, pedal edema still persists and he has been taking '40mg'$  of Lasix daily rather than the increased dose of '40mg'$  bid. He has not been to see his Vascular surgeon- Dr Scot Dock [facility] since 2014.Would also like to have some Flexeril as he suffers from restless legs. Continues to smoke cigars  Past Medical History  Diagnosis Date  . Depression   . Hypertension   . Hyperlipidemia   . Myocardial infarction 2000  . CHF (congestive heart failure)   . COPD (chronic obstructive pulmonary disease)   . Peripheral vascular disease, unspecified     08/20/10 doppler: increase in right ABI post-op. Left ABI stable. S/P bi-fem bypass surgery  . GERD (gastroesophageal reflux disease)   . Asthma   . Anxiety   . History of DVT of lower extremity   . CAD (coronary artery disease) 05/27/10    Cath: severe single vessell CAD left cx midportion obtuse marginal 2 to 3.  . Shortness of breath   . Pneumonia 04/04/2012  . Arthritis   . OSA on CPAP   . Hyperlipemia   . COPD (chronic obstructive pulmonary disease)   . Alcohol abuse     H/O  . Tobacco abuse   . Orbital fracture 12/2012  . Stroke     Past Surgical History  Procedure Laterality Date  . Femoral bypass  08/19/10    Right Fem-Pop  . Aortogram w/ ptca  09/292003  . Eye surgery    . Back surgery    . Total knee arthroplasty    . Cholecystectomy    . Cardiac catheterization  05/27/10    severe CAD left cx  . Abdoninal ao  angio & bifem angio  05/27/10    Patent graft, occluded bil stents with no retrograde flow into the hypogastric arteries. 100% occl left ant. tibial artery, 70% to 80% to 100% stenosis right superficial fem artery above adductor canal. 100 % occl right ant. tibial vessell  . Esophagogastric fundoplication    . Tee without cardioversion N/A 08/29/2014    Procedure: TRANSESOPHAGEAL ECHOCARDIOGRAM (TEE);  Surgeon: Josue Hector, MD;  Location: [facility] ENDOSCOPY;  Service: Cardiovascular;  Laterality: N/A;    Social History   Social History  . Marital Status: Single    Spouse Name: N/A  . Number of Children: N/A  . Years of Education: N/A   Occupational History  . Not on file.   Social History Main Topics  . Smoking status: Current Every Day Smoker -- 1.00 packs/day for 50 years    Types: Cigars, Cigarettes    Start date: 02/09/1961  . Smokeless tobacco: Former Systems developer    Types: Chew     Comment: 2 ppd, full flavor  . Alcohol Use: Yes     Comment: occ  . Drug Use: No  . Sexual Activity: Yes   Other Topics Concern  . Not on file   Social History Narrative   Recovering alcoholic    Allergies  Allergen Reactions  . Darvocet [Propoxyphene N-Acetaminophen] Hives  . Haldol [Haloperidol Decanoate] Hives    Current Outpatient Prescriptions on File Prior to Visit  Medication Sig Dispense Refill  . acetaminophen (TYLENOL) 325 MG tablet Take 650 mg by mouth every 4 (four) hours as needed.    [name] albuterol (PROVENTIL HFA;VENTOLIN HFA) 108 (90 BASE) MCG/ACT inhaler Inhale 2 puffs into the lungs every 6 (six) hours as needed for wheezing or shortness of breath. (Patient not taking: Reported on 09/18/2014) 1 Inhaler 3  . albuterol (PROVENTIL) (2.5 MG/3ML) 0.083% nebulizer solution Take 2.5 mg by nebulization 2 (two) times daily as needed for wheezing or shortness of breath.    [name] aspirin EC 325 MG EC tablet Take 1 tablet (325 mg total) by mouth daily. 30 tablet 0  . clopidogrel (PLAVIX) 75 MG tablet  Take 1 tablet (75 mg total) by mouth daily. 30 tablet 2  . clotrimazole-betamethasone (LOTRISONE) cream Apply 1 application topically 2 (two) times daily. (Patient not taking: Reported on 09/18/2014) 30 g 0  . FOLIC ACID PO Take 1 tablet by mouth daily.    [name] gabapentin (NEURONTIN) 100 MG capsule Take 1 capsule (100 mg total) by mouth 3 (three) times daily. 90 capsule 0  . lisinopril (PRINIVIL,ZESTRIL) 10 MG tablet Take 1 tablet (10 mg total) by mouth daily. 30 tablet 3  . metoprolol tartrate (LOPRESSOR) 25 MG tablet Take 1 tablet (25 mg total) by mouth 2 (two) times daily. 60 tablet 2  . mometasone-formoterol (DULERA) 200-5 MCG/ACT AERO Inhale 2 puffs into the lungs 2 (two) times daily. 13 g 3  . nortriptyline (PAMELOR) 25 MG capsule Take 1 capsule (25 mg total) by mouth at bedtime. (Patient not taking: Reported on 06/18/2014) 30 capsule 1  . omeprazole (PRILOSEC) 20 MG capsule Take 1 capsule (20 mg total) by mouth daily. 30 capsule 11  . Pyridoxine HCl (VITAMIN B-6 PO) Take 1 tablet by mouth daily.    [name] Spacer/Aero-Holding Chambers (BREATHERITE COLL SPACER ADULT) MISC 1 Device by Does not apply route as needed (with inhaler). (Patient not taking: Reported on 09/18/2014) 1 each 0  . vitamin B-12 (CYANOCOBALAMIN) 100 MCG tablet Take 100 mcg by mouth daily.    . vitamin E 200 UNIT capsule Take 200 Units by mouth daily.     No current facility-administered medications on file prior to visit.     Review of Systems  Constitutional: Negative for fever, chills, diaphoresis, activity change, appetite change and fatigue. HENT: Negative for ear pain, nosebleeds, congestion, facial swelling, rhinorrhea, neck pain, neck stiffness and ear discharge.  Eyes: Negative for pain, discharge, redness, itching and visual disturbance. Respiratory: Negative for cough, choking, chest tightness, positive forshortness of breath, negative for wheezing and stridor.  Cardiovascular: Negative for chest pain, palpitations and  positive forleg swelling. Gastrointestinal: Negative for abdominal distention. Genitourinary: Negative for dysuria, urgency, frequency, hematuria, flank pain, decreased urine volume, difficulty urinating and dyspareunia.  Musculoskeletal: see hpi  Neurological: Negative for dizziness, tremors, seizures, syncope, facial asymmetry, speech difficulty, positive for weakness,negative for light-headedness, numbness and headaches.  Hematological: Negative for adenopathy. Does not bruise/bleed easily. Skin: Negative for rash, ulcer. Psychiatric/Behavioral: Negative for hallucinations, behavioral problems, confusion, dysphoric mood, decreased concentration and agitation.          Objective:  Filed Vitals:   10/02/14 1148  BP: 161/84  Pulse: 86  Temp: 98 F (36.7 C)  Resp: 16  Height: 6' (1.829 m)  Weight: 247 lb (112.038 kg)  SpO2: 95%  Physical Exam  Constitutional: Patient appears well-developed and well-nourished. No distress. Neck: Normal ROM, No JVD. No tracheal deviation. No thyromegaly. CVS: RRR, S1/S2 +, no murmurs, no gallops, no carotid bruit., 2+bilateral pedal edema; unable to palpate dorsalis pedis bilaterally Pulmonary: Effort and breath sounds normal, no stridor, rhonchi, wheezes, rales.  Abdominal: Soft. BS +, no distension, tenderness, rebound or guarding.  Musculoskeletal: tenderness on ROM of knee and hip joints.  Lymphadenopathy: No lymphadenopathy noted, cervical, inguinal or axillary Neuro: Alert. Normal reflexes,motor strength is 5/5 in right upper and right lower extremity; 4/5 in left upper and left lower extremity. Skin: mild bilateral lower extremity erythema. No rash noted. Not diaphoretic. No pallor. Psychiatric: Normal mood and affect. Behavior, judgment, thought content normal.        Assessment & Plan:  64 year old male here today to establish medical care. Past medical history is notable for hypertension, CAD, CVA, PAD, DVT/PE with a recent  hospitalization at [facility] from 08/26/14-08/29/14 where he was managed for a CVA.  CVA; Mild residual left-sided weakness. High risk for falls, ambulates with the aid of a cane. OT as per schedule. Continue ASA '325mg'$   Acute on chronic CHF: EF 55% Due to pedal edema I am increasing the dose of his Lasix to 40 mg twice daily and will recheck his renal function at next visit. Daily weight checks, limit fluid intake to less than 2 L a day. Scheduled to see Heart care on 11/09/14  CAD: Continue Plavix and advised to keep appointment with cardiology.  PVD: Advised on smoking cessation as claudication pains indicate symptoms are worsening Referred to Vascular.  COPD: No acute exacerbation. Continue Spiriva, Dulera and rescue inhaler.

## 2014-10-05 ENCOUNTER — Telehealth: Payer: Self-pay | Admitting: Family Medicine

## 2014-10-05 NOTE — Telephone Encounter (Signed)
Patient called requesting to speak to nurse regarding medication cyclobenzaprine (FEXMID) 7.5 MG tablet,insurance did not authorize medication, medication that is approved under insurance is Flexeril. Please f/u with pharmacy.

## 2014-10-08 ENCOUNTER — Other Ambulatory Visit: Payer: Self-pay | Admitting: Family Medicine

## 2014-10-08 MED ORDER — TIZANIDINE HCL 4 MG PO TABS: 4.0000 mg | ORAL_TABLET | Freq: Three times a day (TID) | ORAL | Status: DC

## 2014-10-08 NOTE — Progress Notes (Signed)
I have switched to  From Cyclobenzaprine toTizanidine

## 2014-10-08 NOTE — Telephone Encounter (Signed)
Patients Mier had questions about switching medication due to not being covered by insurance,   cyclobenzaprine (FEXMID) 7.5 MG tablet ,insurance did not authorize medication, medication that is approved under insurance is Flexeril. Please f/u with pharmacy.          Please follow up.

## 2014-10-10 ENCOUNTER — Other Ambulatory Visit: Payer: Self-pay

## 2014-10-10 DIAGNOSIS — I739 Peripheral vascular disease, unspecified: Secondary | ICD-10-CM

## 2014-10-17 ENCOUNTER — Ambulatory Visit: Payer: Medicare Other | Attending: Family Medicine | Admitting: Family Medicine

## 2014-10-17 ENCOUNTER — Encounter: Payer: Self-pay | Admitting: Family Medicine

## 2014-10-17 VITALS — BP 173/91 | HR 83 | Temp 98.4°F | Ht 72.0 in | Wt 251.0 lb

## 2014-10-17 DIAGNOSIS — I5032 Chronic diastolic (congestive) heart failure: Secondary | ICD-10-CM | POA: Insufficient documentation

## 2014-10-17 DIAGNOSIS — I10 Essential (primary) hypertension: Secondary | ICD-10-CM | POA: Diagnosis not present

## 2014-10-17 DIAGNOSIS — F17210 Nicotine dependence, cigarettes, uncomplicated: Secondary | ICD-10-CM | POA: Insufficient documentation

## 2014-10-17 DIAGNOSIS — I739 Peripheral vascular disease, unspecified: Secondary | ICD-10-CM | POA: Diagnosis not present

## 2014-10-17 DIAGNOSIS — J449 Chronic obstructive pulmonary disease, unspecified: Secondary | ICD-10-CM | POA: Diagnosis not present

## 2014-10-17 DIAGNOSIS — I638 Other cerebral infarction: Secondary | ICD-10-CM | POA: Insufficient documentation

## 2014-10-17 DIAGNOSIS — E870 Hyperosmolality and hypernatremia: Secondary | ICD-10-CM | POA: Insufficient documentation

## 2014-10-17 DIAGNOSIS — I2510 Atherosclerotic heart disease of native coronary artery without angina pectoris: Secondary | ICD-10-CM | POA: Diagnosis not present

## 2014-10-17 DIAGNOSIS — E875 Hyperkalemia: Secondary | ICD-10-CM | POA: Insufficient documentation

## 2014-10-17 DIAGNOSIS — I6389 Other cerebral infarction: Secondary | ICD-10-CM

## 2014-10-17 LAB — BASIC METABOLIC PANEL
BUN: 8 mg/dL (ref 7–25)
CO2: 30 mmol/L (ref 20–31)
Calcium: 10.1 mg/dL (ref 8.6–10.3)
Chloride: 109 mmol/L (ref 98–110)
Creat: 0.97 mg/dL (ref 0.70–1.25)
Glucose, Bld: 141 mg/dL — ABNORMAL HIGH (ref 65–99)
POTASSIUM: 5.9 mmol/L — AB (ref 3.5–5.3)
Sodium: 148 mmol/L — ABNORMAL HIGH (ref 135–146)

## 2014-10-17 MED ORDER — CARVEDILOL 6.25 MG PO TABS: 6.2500 mg | ORAL_TABLET | Freq: Two times a day (BID) | ORAL | Status: DC

## 2014-10-17 MED ORDER — TIZANIDINE HCL 4 MG PO TABS: 4.0000 mg | ORAL_TABLET | Freq: Three times a day (TID) | ORAL | Status: DC

## 2014-10-17 MED ORDER — ALBUTEROL SULFATE HFA 108 (90 BASE) MCG/ACT IN AERS: 2.0000 | INHALATION_SPRAY | Freq: Four times a day (QID) | RESPIRATORY_TRACT | Status: DC | PRN

## 2014-10-17 NOTE — Progress Notes (Signed)
Subjective:    Patient ID: [name], male    DOB: [birth year], 64 y.o.   MRN: [id number]  HPI 64 year old male with a past medical history notable for hypertension, CAD, CVA, PAD, DVT/PE, CHF (EF55%),CVA here for a follow up on HTN and his blood pressure remains elevated despite compliance with medications. He also continues to complain of bilateral lower extremity edema and his Lasix dose was doubled at his last office visit but he still has some residual edema. He has an upcoming appointment with cardiology on 11/09/14 and was referred to the vein and vascular specialist at his last office visit for his peripheral vascular disease as he continues to have claudication pains. He is scheduled to have eye surgery with his ophthalmologist and has a form requiring medical clearance. With regards to his COPD he is doing well and denies any shortness of breath at this time; he requests a refill of his MDI.   Past Medical History  Diagnosis Date  . Depression   . Hypertension   . Hyperlipidemia   . Myocardial infarction 2000  . CHF (congestive heart failure)   . COPD (chronic obstructive pulmonary disease)   . Peripheral vascular disease, unspecified     08/20/10 doppler: increase in right ABI post-op. Left ABI stable. S/P bi-fem bypass surgery  . GERD (gastroesophageal reflux disease)   . Asthma   . Anxiety   . History of DVT of lower extremity   . CAD (coronary artery disease) 05/27/10    Cath: severe single vessell CAD left cx midportion obtuse marginal 2 to 3.  . Shortness of breath   . Pneumonia 04/04/2012  . Arthritis   . OSA on CPAP   . Hyperlipemia   . COPD (chronic obstructive pulmonary disease)   . Alcohol abuse     H/O  . Tobacco abuse   . Orbital fracture 12/2012  . Stroke     Past Surgical History  Procedure Laterality Date  . Femoral bypass  08/19/10    Right Fem-Pop  . Aortogram w/ ptca  09/292003  . Eye surgery    . Back surgery    . Total knee  arthroplasty    . Cholecystectomy    . Cardiac catheterization  05/27/10    severe CAD left cx  . Abdoninal ao angio & bifem angio  05/27/10    Patent graft, occluded bil stents with no retrograde flow into the hypogastric arteries. 100% occl left ant. tibial artery, 70% to 80% to 100% stenosis right superficial fem artery above adductor canal. 100 % occl right ant. tibial vessell  . Esophagogastric fundoplication    . Tee without cardioversion N/A 08/29/2014    Procedure: TRANSESOPHAGEAL ECHOCARDIOGRAM (TEE);  Surgeon: Josue Hector, MD;  [facility] ENDOSCOPY;  Service: Cardiovascular;  Laterality: N/A;    Social History   Social History  . Marital Status: Single    Spouse Name: N/A  . Number of Children: N/A  . Years of Education: N/A   Occupational History  . Not on file.   Social History Main Topics  . Smoking status: Current Every Day Smoker -- 1.00 packs/day for 50 years    Types: Cigars, Cigarettes    Start date: 02/09/1961  . Smokeless tobacco: Former Systems developer    Types: Chew     Comment: 2 ppd, full flavor  . Alcohol Use: Yes     Comment: patient states 2-4 beers per day maybe more  . Drug Use: No  .  Sexual Activity: Yes   Other Topics Concern  . Not on file   Social History Narrative   Recovering alcoholic    Allergies  Allergen Reactions  . Darvocet [Propoxyphene N-Acetaminophen] Hives  . Haldol [Haloperidol Decanoate] Hives    Current Outpatient Prescriptions on File Prior to Visit  Medication Sig Dispense Refill  . albuterol (PROVENTIL) (2.5 MG/3ML) 0.083% nebulizer solution Take 2.5 mg by nebulization 2 (two) times daily as needed for wheezing or shortness of breath.    [name] aspirin EC 325 MG EC tablet Take 1 tablet (325 mg total) by mouth daily. 30 tablet 0  . atorvastatin (LIPITOR) 40 MG tablet Take 1 tablet (40 mg total) by mouth daily at 6 PM. 30 tablet 2  . clopidogrel (PLAVIX) 75 MG tablet Take 1 tablet (75 mg total) by mouth daily. 30 tablet 2  .  FOLIC ACID PO Take 1 tablet by mouth daily.    . furosemide (LASIX) 40 MG tablet Take 1 tablet (40 mg total) by mouth 2 (two) times daily. 60 tablet 2  . lisinopril (PRINIVIL,ZESTRIL) 10 MG tablet Take 1 tablet (10 mg total) by mouth daily. 30 tablet 3  . mometasone-formoterol (DULERA) 200-5 MCG/ACT AERO Inhale 2 puffs into the lungs 2 (two) times daily. 13 g 3  . omeprazole (PRILOSEC) 20 MG capsule Take 1 capsule (20 mg total) by mouth daily. 30 capsule 11  . Pyridoxine HCl (VITAMIN B-6 PO) Take 1 tablet by mouth daily.    [name] tiotropium (SPIRIVA) 18 MCG inhalation capsule Place 1 capsule (18 mcg total) into inhaler and inhale daily. 30 capsule 3  . traMADol (ULTRAM) 50 MG tablet Take 1 tablet (50 mg total) by mouth every 8 (eight) hours as needed. 60 tablet 2  . vitamin B-12 (CYANOCOBALAMIN) 100 MCG tablet Take 100 mcg by mouth daily.    . vitamin E 200 UNIT capsule Take 200 Units by mouth daily.     No current facility-administered medications on file prior to visit.     Review of Systems    Constitutional: Negative for fever, chills, diaphoresis, activity change, appetite change and fatigue. HENT: Negative for ear pain, nosebleeds, congestion, facial swelling, rhinorrhea, neck pain, neck stiffness and ear discharge.  Eyes: Negative for pain, discharge, redness, itching and visual disturbance. Respiratory: Negative for cough, choking, chest tightness, positive forshortness of breath, negative for wheezing and stridor.  Cardiovascular: Negative for chest pain, palpitations and positive for leg swelling, positive for intermittent claudication Gastrointestinal: Negative for abdominal distention. Genitourinary: Negative for dysuria, urgency, frequency, hematuria, flank pain, decreased urine volume, difficulty urinating and dyspareunia.  Musculoskeletal: see hpi  Neurological: Negative for dizziness, tremors, seizures, syncope, facial asymmetry, speech difficulty, positive for weakness,negative  for light-headedness, numbness and headaches.  Hematological: Negative for adenopathy. Does not bruise/bleed easily. Skin: Negative for rash, ulcer. Psychiatric/Behavioral: Negative for hallucinations, behavioral problems, confusion, dysphoric mood, decreased concentration and agitation.   Objective: Filed Vitals:   10/17/14 0936  BP: 173/91  Pulse: 83  Temp: 98.4 F (36.9 C)  Height: 6' (1.829 m)  Weight: 251 lb (113.853 kg)  SpO2: 98%   Wt Readings from Last 3 Encounters:  10/17/14 251 lb (113.853 kg)  10/02/14 247 lb (112.038 kg)  09/18/14 248 lb (112.492 kg)       Physical Exam  Constitutional: Patient appears well-developed and well-nourished. No distress. Neck: Normal ROM, No JVD. No tracheal deviation. No thyromegaly. CVS: RRR, S1/S2 +, no murmurs, no gallops, no carotid bruit., 2+bilateral pedal edema;  unable to palpate dorsalis pedis bilaterally Pulmonary: Effort and breath sounds normal, no stridor, rhonchi, wheezes, rales.  Abdominal: Soft. BS +, no distension, tenderness, rebound or guarding.  Musculoskeletal: tenderness on ROM of knee and hip joints.  Lymphadenopathy: No lymphadenopathy noted, cervical, inguinal or axillary Neuro: Alert. Normal reflexes,motor strength is 5/5 in right upper and right lower extremity; 4/5 in left upper and left lower extremity. Skin: mild bilateral lower extremity erythema. No rash noted. Not diaphoretic. No pallor. Psychiatric: Normal mood and affect. Behavior, judgment, thought content normal.  CMP Latest Ref Rng 09/18/2014 08/26/2014 08/26/2014  Glucose 65 - 99 mg/dL 82 134(H) 133(H)  BUN 7 - 25 mg/dL '12 19 16  '$ Creatinine 0.70 - 1.25 mg/dL 1.10 1.30(H) 1.36(H)  Sodium 135 - 146 mmol/L 150(H) 137 135  Potassium 3.5 - 5.3 mmol/L 5.6(H) 3.8 4.0  Chloride 98 - 110 mmol/L 108 101 100(L)  CO2 20 - 31 mmol/L 30 - 23  Calcium 8.6 - 10.3 mg/dL 10.3 - 9.6  Total Protein 6.5 - 8.1 g/dL - - 7.4  Total Bilirubin 0.3 - 1.2 mg/dL - - 0.7    Alkaline Phos 38 - 126 U/L - - 69  AST 15 - 41 U/L - - 29  ALT 17 - 63 U/L - - 26        Assessment & Plan:  64 year old male with a past medical history notable for hypertension, CAD, CVA, PAD, DVT/PE, CVA here for a follow up on BP.  HTN: Uncontrolled Switched from Metoprolol to Coreg for better control Reassess BP at next visit Will not clear for eye surgery until BP is better controlled.  CVA; Mild residual left-sided weakness. High risk for falls, ambulates with the aid of a cane. OT as per schedule. Continue ASA '325mg'$   Chronic diastolic CHF: EF 56% Continue Lasix 40 mg twice daily and will recheck his renal function today. Daily weight checks, limit fluid intake to less than 2 L a day. Scheduled to see Heart care on 11/09/14  CAD: Continue Plavix and advised to keep appointment with cardiology.  PVD: Advised on smoking cessation as claudication pains indicate symptoms are worsening Referred to Vascular.  COPD: No acute exacerbation. Continue Spiriva, Dulera and rescue inhaler.  Hypernatremia/hyperkalemia: Repeat BMET today

## 2014-10-17 NOTE — Progress Notes (Signed)
Patient is here for follow up on his HTN He needs refill on his albuterol He would like prescription for Flexeril

## 2014-10-17 NOTE — Patient Instructions (Signed)

## 2014-10-18 ENCOUNTER — Other Ambulatory Visit: Payer: Self-pay | Admitting: Family Medicine

## 2014-10-18 MED ORDER — LISINOPRIL 5 MG PO TABS: 5.0000 mg | ORAL_TABLET | Freq: Every day | ORAL | Status: DC

## 2014-10-22 ENCOUNTER — Telehealth: Payer: Self-pay | Admitting: *Deleted

## 2014-10-22 NOTE — Telephone Encounter (Signed)
-----   Message from Arnoldo Morale, MD sent at 10/18/2014  2:18 PM EDT ----- Please inform him that his potassium is elevated and so I will need him to cut back on the lisinopril from 10 mg to 5 mg and I will repeat his potassium at his next visit.

## 2014-10-22 NOTE — Telephone Encounter (Signed)
Left HIPAA compliant message for patient to return call to the RN at [phone]

## 2014-10-24 ENCOUNTER — Encounter: Payer: Self-pay | Admitting: Family Medicine

## 2014-10-24 ENCOUNTER — Ambulatory Visit: Payer: Medicare Other | Attending: Family Medicine | Admitting: Family Medicine

## 2014-10-24 VITALS — BP 133/80 | HR 86 | Temp 97.8°F | Ht 72.0 in | Wt 245.0 lb

## 2014-10-24 DIAGNOSIS — Z96651 Presence of right artificial knee joint: Secondary | ICD-10-CM | POA: Diagnosis not present

## 2014-10-24 DIAGNOSIS — I739 Peripheral vascular disease, unspecified: Secondary | ICD-10-CM | POA: Diagnosis not present

## 2014-10-24 DIAGNOSIS — I10 Essential (primary) hypertension: Secondary | ICD-10-CM | POA: Diagnosis present

## 2014-10-24 DIAGNOSIS — I638 Other cerebral infarction: Secondary | ICD-10-CM | POA: Diagnosis not present

## 2014-10-24 DIAGNOSIS — E875 Hyperkalemia: Secondary | ICD-10-CM | POA: Diagnosis not present

## 2014-10-24 DIAGNOSIS — Z96659 Presence of unspecified artificial knee joint: Secondary | ICD-10-CM

## 2014-10-24 DIAGNOSIS — Z720 Tobacco use: Secondary | ICD-10-CM | POA: Insufficient documentation

## 2014-10-24 DIAGNOSIS — I2510 Atherosclerotic heart disease of native coronary artery without angina pectoris: Secondary | ICD-10-CM | POA: Diagnosis not present

## 2014-10-24 DIAGNOSIS — I6389 Other cerebral infarction: Secondary | ICD-10-CM

## 2014-10-24 HISTORY — DX: Presence of unspecified artificial knee joint: Z96.659

## 2014-10-24 HISTORY — DX: Hyperkalemia: E87.5

## 2014-10-24 LAB — BASIC METABOLIC PANEL
BUN: 15 mg/dL (ref 7–25)
CHLORIDE: 108 mmol/L (ref 98–110)
CO2: 32 mmol/L — AB (ref 20–31)
CREATININE: 1.11 mg/dL (ref 0.70–1.25)
Calcium: 10.9 mg/dL — ABNORMAL HIGH (ref 8.6–10.3)
Glucose, Bld: 166 mg/dL — ABNORMAL HIGH (ref 65–99)
Potassium: 6 mmol/L — ABNORMAL HIGH (ref 3.5–5.3)
Sodium: 149 mmol/L — ABNORMAL HIGH (ref 135–146)

## 2014-10-24 MED ORDER — ATORVASTATIN CALCIUM 40 MG PO TABS: 40.0000 mg | ORAL_TABLET | Freq: Every day | ORAL | Status: DC

## 2014-10-24 NOTE — Progress Notes (Signed)
Patient ID: [name], male   DOB: Apr 13, 1950, 64 y.o.   MRN: [id number] Patient here for blood pressure check Pressure is 133/80 heart rate is 86 Patient reports feeling much better than last week He reports pain in his feet and legs when he walks it is a 9/19 aching pain He needs refill on his plavix but is unsure if he is still taking it

## 2014-10-24 NOTE — Progress Notes (Signed)
Subjective:    Patient ID: [name], male    DOB: [birth year], 64 y.o.   MRN: [id number]  HPI 64 year old male with a past medical history notable for hypertension, CAD, CVA, PAD, DVT/PE, CVA here for a follow up on BP which is much improved today compared to his last visit.   The patient tells me he feels good and feels ' like he is a 64 year old'. Pedal edema which he previously had has almost resolved.   His last set of labs revealed a hyperkalemia 5.9 and lisinopril was reduced from 10 mg to 5 mg and he reports taking the 5 mg daily. He is scheduled to undergo eye surgery with [facility] eye surgical and [place] and is needing surgical clearance.  A knee brace as he is status post right knee replacement and has occasional pain and swelling from time to time and ambulates with the aid of a cane due to gait instability. He continues to smoke but states he has cut down significantly on smokes 4 packs of cigarettes in a month. Denies shortness of breath or chest pains and is scheduled to see cardiology at the end of the month as well as vascular surgery due to his history of coronary artery disease and peripheral vascular disease.   Past Medical History  Diagnosis Date  . Depression   . Hypertension   . Hyperlipidemia   . Myocardial infarction 2000  . CHF (congestive heart failure)   . COPD (chronic obstructive pulmonary disease)   . Peripheral vascular disease, unspecified     08/20/10 doppler: increase in right ABI post-op. Left ABI stable. S/P bi-fem bypass surgery  . GERD (gastroesophageal reflux disease)   . Asthma   . Anxiety   . History of DVT of lower extremity   . CAD (coronary artery disease) 05/27/10    Cath: severe single vessell CAD left cx midportion obtuse marginal 2 to 3.  . Shortness of breath   . Pneumonia 04/04/2012  . Arthritis   . OSA on CPAP   . Hyperlipemia   . COPD (chronic obstructive pulmonary disease)   . Alcohol abuse     H/O  . Tobacco  abuse   . Orbital fracture 12/2012  . Stroke     Past Surgical History  Procedure Laterality Date  . Femoral bypass  08/19/10    Right Fem-Pop  . Aortogram w/ ptca  09/292003  . Eye surgery    . Back surgery    . Total knee arthroplasty    . Cholecystectomy    . Cardiac catheterization  05/27/10    severe CAD left cx  . Abdoninal ao angio & bifem angio  05/27/10    Patent graft, occluded bil stents with no retrograde flow into the hypogastric arteries. 100% occl left ant. tibial artery, 70% to 80% to 100% stenosis right superficial fem artery above adductor canal. 100 % occl right ant. tibial vessell  . Esophagogastric fundoplication    . Tee without cardioversion N/A 08/29/2014    Procedure: TRANSESOPHAGEAL ECHOCARDIOGRAM (TEE);  Surgeon: Josue Hector, MD;  Location: [facility] ENDOSCOPY;  Service: Cardiovascular;  Laterality: N/A;    Social History   Social History  . Marital Status: Single    Spouse Name: N/A  . Number of Children: N/A  . Years of Education: N/A   Occupational History  . Not on file.   Social History Main Topics  . Smoking status: Current Every Day Smoker -- 1.00 packs/day  for 50 years    Types: Cigars, Cigarettes    Start date: 02/09/1961  . Smokeless tobacco: Former Systems developer    Types: Chew     Comment: 2 ppd, full flavor  . Alcohol Use: No     Comment: 10/24/14 no drinking for 2 weeks  . Drug Use: No  . Sexual Activity: Yes   Other Topics Concern  . Not on file   Social History Narrative   Recovering alcoholic    Allergies  Allergen Reactions  . Darvocet [Propoxyphene N-Acetaminophen] Hives  . Haldol [Haloperidol Decanoate] Hives     Review of Systems Constitutional: Negative for fever, chills, diaphoresis, activity change, appetite change and fatigue. HENT: Negative for ear pain, nosebleeds, congestion, facial swelling, rhinorrhea, neck pain, neck stiffness and ear discharge.  Eyes: Negative for pain, discharge, redness, itching and visual  disturbance. Respiratory: Negative for cough, choking, chest tightness, positive forshortness of breath, negative for wheezing and stridor.  Cardiovascular: Negative for chest pain, palpitations and positive for leg swelling, positive for intermittent claudication Gastrointestinal: Negative for abdominal distention. Genitourinary: Negative for dysuria, urgency, frequency, hematuria, flank pain, decreased urine volume, difficulty urinating and dyspareunia.  Musculoskeletal: see hpi  Neurological: Negative for dizziness, tremors, seizures, syncope, facial asymmetry, speech difficulty, positive for weakness,negative for light-headedness, numbness and headaches.  Hematological: Negative for adenopathy. Does not bruise/bleed easily. Skin: Negative for rash, ulcer. Psychiatric/Behavioral: Negative for hallucinations, behavioral problems, confusion, dysphoric mood, decreased concentration and agitation.      Objective:  Filed Vitals:   10/24/14 1024  BP: 133/80  Pulse: 86  Temp: 97.8 F (36.6 C)  Height: 6' (1.829 m)  Weight: 245 lb (111.131 kg)  SpO2: 96%      Physical Exam  Constitutional: Patient appears well-developed and well-nourished. No distress. Neck: Normal ROM, No JVD. No tracheal deviation. No thyromegaly. CVS: RRR, S1/S2 +, no murmurs, no gallops, no carotid bruit., 2+bilateral pedal edema; unable to palpate dorsalis pedis bilaterally Pulmonary: Effort and breath sounds normal, no stridor, rhonchi, wheezes, rales.  Abdominal: Soft. BS +, no distension, tenderness, rebound or guarding.  Musculoskeletal: right knee vertical scar, tenderness on ROM of knee and hip joints.  Lymphadenopathy: No lymphadenopathy noted, cervical, inguinal or axillary Neuro: Alert. Normal reflexes,motor strength is 5/5 in right upper and right lower extremity; 4/5 in left upper and left lower extremity. Skin: mild bilateral lower extremity erythema. No rash noted. Not diaphoretic. No  pallor. Psychiatric: Normal mood and affect. Behavior, judgment, thought content normal.  CMP Latest Ref Rng 10/17/2014 09/18/2014 08/26/2014  Glucose 65 - 99 mg/dL 141(H) 82 134(H)  BUN 7 - 25 mg/dL '8 12 19  '$ Creatinine 0.70 - 1.25 mg/dL 0.97 1.10 1.30(H)  Sodium 135 - 146 mmol/L 148(H) 150(H) 137  Potassium 3.5 - 5.3 mmol/L 5.9(H) 5.6(H) 3.8  Chloride 98 - 110 mmol/L 109 108 101  CO2 20 - 31 mmol/L 30 30 -  Calcium 8.6 - 10.3 mg/dL 10.1 10.3 -  Total Protein 6.5 - 8.1 g/dL - - -  Total Bilirubin 0.3 - 1.2 mg/dL - - -  Alkaline Phos 38 - 126 U/L - - -  AST 15 - 41 U/L - - -  ALT 17 - 63 U/L - - -         Assessment & Plan:  63 year old male with a past medical history notable for hypertension, CAD, CVA, PAD, DVT/PE, CVA here for a follow up on BP.  HTN: Controlled Cleared for moderate risk procedure- faxed completed form  to [facility]  CVA; Mild residual left-sided weakness. High risk for falls, ambulates with the aid of a cane. Continue ASA '325mg'$   Chronic diastolic CHF: EF 62% Continue Lasix 40 mg twice daily and will recheck his renal function today. Daily weight checks, limit fluid intake to less than 2 L a day. Scheduled to see Heart care on 11/09/14  CAD: Continue Plavix and advised to keep appointment with cardiology.  PVD: Advised on smoking cessation as claudication pains indicate symptoms are worsening Has an upcoming appointment with Vascular.  COPD: No acute exacerbation. Continue Spiriva, Dulera and rescue inhaler.  Hyperkalemia: Lisinopril dose was decreased a week ago from 10 to '5mg'$  Repeat BMET today  Knee pain s/p R total knee replacement. Prescription written for knee brace  Tobacco abuse: Spent 4 minutes counseling patient on cessation and he is actively working on quitting.

## 2014-10-25 ENCOUNTER — Telehealth: Payer: Self-pay | Admitting: *Deleted

## 2014-10-25 ENCOUNTER — Other Ambulatory Visit: Payer: Self-pay | Admitting: Family Medicine

## 2014-10-25 MED ORDER — SODIUM POLYSTYRENE SULFONATE 15 GM/60ML PO SUSP: 30.0000 g | Freq: Once | ORAL | Status: DC

## 2014-10-25 NOTE — Telephone Encounter (Signed)
-----   Message from Arnoldo Morale, MD sent at 10/25/2014  8:20 AM EDT ----- Please advised him to discontinue lisinopril because he is still hyperkalemic; I have sent a prescription for Kayexalate to his pharmacy.

## 2014-10-25 NOTE — Telephone Encounter (Signed)
Left HIPAA compliant message for patient to return call to RN at [phone]

## 2014-10-30 ENCOUNTER — Encounter: Payer: Self-pay | Admitting: *Deleted

## 2014-10-30 ENCOUNTER — Telehealth: Payer: Self-pay | Admitting: *Deleted

## 2014-10-30 NOTE — Telephone Encounter (Signed)
Left message for patient to return RN call at [phone].  Letter mailed with lab results and instructions to patient's home address.

## 2014-10-30 NOTE — Telephone Encounter (Signed)
-----   Message from Arnoldo Morale, MD sent at 10/25/2014  8:20 AM EDT ----- Please advised him to discontinue lisinopril because he is still hyperkalemic; I have sent a prescription for Kayexalate to his pharmacy.

## 2014-11-01 ENCOUNTER — Encounter: Payer: Self-pay | Admitting: Neurology

## 2014-11-01 ENCOUNTER — Ambulatory Visit (INDEPENDENT_AMBULATORY_CARE_PROVIDER_SITE_OTHER): Payer: Medicare Other | Admitting: Neurology

## 2014-11-01 VITALS — BP 153/70 | HR 69 | Ht 72.0 in | Wt 245.8 lb

## 2014-11-01 DIAGNOSIS — I5033 Acute on chronic diastolic (congestive) heart failure: Secondary | ICD-10-CM | POA: Diagnosis not present

## 2014-11-01 DIAGNOSIS — Z720 Tobacco use: Secondary | ICD-10-CM | POA: Diagnosis not present

## 2014-11-01 DIAGNOSIS — E785 Hyperlipidemia, unspecified: Secondary | ICD-10-CM | POA: Diagnosis not present

## 2014-11-01 DIAGNOSIS — I63231 Cerebral infarction due to unspecified occlusion or stenosis of right carotid arteries: Secondary | ICD-10-CM | POA: Diagnosis not present

## 2014-11-01 DIAGNOSIS — I6521 Occlusion and stenosis of right carotid artery: Secondary | ICD-10-CM

## 2014-11-01 DIAGNOSIS — I10 Essential (primary) hypertension: Secondary | ICD-10-CM | POA: Diagnosis not present

## 2014-11-01 DIAGNOSIS — F1010 Alcohol abuse, uncomplicated: Secondary | ICD-10-CM

## 2014-11-01 DIAGNOSIS — I6529 Occlusion and stenosis of unspecified carotid artery: Secondary | ICD-10-CM | POA: Insufficient documentation

## 2014-11-01 DIAGNOSIS — F1410 Cocaine abuse, uncomplicated: Secondary | ICD-10-CM

## 2014-11-01 DIAGNOSIS — F17200 Nicotine dependence, unspecified, uncomplicated: Secondary | ICD-10-CM | POA: Insufficient documentation

## 2014-11-01 HISTORY — DX: Acute on chronic diastolic (congestive) heart failure: I50.33

## 2014-11-01 HISTORY — DX: Cerebral infarction due to unspecified occlusion or stenosis of right carotid arteries: I63.231

## 2014-11-01 NOTE — Progress Notes (Signed)
STROKE NEUROLOGY FOLLOW UP NOTE  NAME: [name] DOB: 1951-01-23  REASON FOR VISIT: stroke follow up HISTORY FROM: pt and chart  Today we had the pleasure of seeing [name] in follow-up at our Neurology Clinic. Pt was accompanied by no one.   History Summary [name] is a 64 y.o. male with history of  hypertension, hyperlipidemia, coronary artery disease with previous MI, COPD, congestive heart failure, substance abuse, alcohol abuse, tobacco abuse, history of DVT w/ poor medical compliance with anticoagulation follows up in stroke clinic today.   He had right frontal punctate infarcts on 07/31/14 and then right small frontal infarcts on 08/19/14 and again admitted on 08/26/14 for worsening of residual left hemiparesis and dysarthria. MRI showed right corpus collosum punctate infarct and MRA showed high grade stenosis at right cavernous ICA. All his infarcts are felt to be watershed in setting of hypotension and high grade right ICA stenosis although could not completely rule out cardio embolic source. Had TTE, TEE, CUS, DVT screening, hypercoagulable and autoimmune work up all negative. LDL 75 and A1C 6.4. He was continued on ASA and plavix and lipitor and medication compliance was again emphasized. He continued smoking and alcohol use, and his UDS showed positive for cocaine, opiates and benzo. Due to his noncompliance with medication, illicit drug use, alcoholic, and hx of GIB on coumadin, no anticoagulation was recommended. His last DVT was more than 6 months prior. He was discharged home.  Interval History During the interval time, the patient has been doing well. No stroke like symptoms. He continued smoking and not willing to quit. He said he has cut down alcohol and no cocaine so far. He is going to have left cataract surgery and asking for clearance. He did not check BP at home but today BP 153/70.  REVIEW OF SYSTEMS: Full 14 system review of systems  performed and notable only for those listed below and in HPI above, all others are negative:  Constitutional:  fatigue Cardiovascular: leg swelling Ear/Nose/Throat:  Hearing loss Skin: rash Eyes:  Eye itching Respiratory:  Cough, SOB Gastroitestinal:   Genitourinary:  Hematology/Lymphatic:  Bruise easily Endocrine:  Musculoskeletal:  Joint pain, joint swelling, back pain, aching muscles, muscle cramping, walking difficulty Allergy/Immunology:   Neurological:  Memory loss Psychiatric: depression, nervous and anxious Sleep: restless leg  The following represents the patient's updated allergies and side effects list: Allergies  Allergen Reactions  . Darvocet [Propoxyphene N-Acetaminophen] Hives  . Haldol [Haloperidol Decanoate] Hives  . Acetaminophen Nausea Only    Upset stomach, tolerates Hydrocodone/APAP if taken with food    The neurologically relevant items on the patient's problem list were reviewed on today's visit.  Neurologic Examination   A problem focused neurological exam (12 or more points of the single system neurologic examination, vital signs counts as 1 point, cranial nerves count for 8 points) was performed.  Blood pressure 153/70, pulse 69, height 6' (1.829 m), weight 245 lb 12.8 oz (111.494 kg).  General - obese, well developed, in no apparent distress.  Ophthalmologic - Fundi not visualized due to eye movement.  Cardiovascular - Regular rate and rhythm with no murmur.  Mental Status -  Level of arousal and orientation to time, place, and person were intact. Language including expression, naming, repetition, comprehension was assessed and found intact. Fund of Knowledge was assessed and was impaired  Cranial Nerves II - XII - II - Visual field intact OU. III, IV, VI - Extraocular movements  intact. V - Facial sensation intact bilaterally. VII - left nasolabial fold flattening. VIII - Hearing & vestibular intact bilaterally. X - Palate elevates  symmetrically, poor denture. XI - Chin turning & shoulder shrug intact bilaterally. XII - Tongue protrusion intact.  Motor Strength - The patient's strength was 5-/5 in all extremities and pronator drift was absent.  Bulk was normal and fasciculations were absent.   Motor Tone - Muscle tone was assessed at the neck and appendages and was normal.  Reflexes - The patient's reflexes were 1+ in all extremities and he had no pathological reflexes.  Sensory - Light touch, temperature/pinprick were assessed and were normal.    Coordination - The patient had normal movements in the hands and feet with no ataxia or dysmetria.  Tremor was absent.  Gait and Station - walk with cane, broad-based gait, slow but steady.  Data reviewed: I personally reviewed the images and agree with the radiology interpretations.  MRI head  08/01/14 1. At least 5 punctate nonhemorrhagic infarcts over the right frontal lobe within a watershed distribution between the [place] and MCA territories. This corresponds with the hypotensive episode and left arm symptoms. 2. Moderate atrophy and diffuse white matter disease is advanced for age. This likely reflects the sequela of chronic microvascular ischemia. 3. 7 mm dural-based lesion over the left frontal convexity likely represents a meningioma. 4. Chronic left ethmoid and maxillary sinus disease.  MRI head 08/19/14 Newly seen foci of acute infarction in the right posterior frontal cortex near the vertex. This could represent extension of watershed infarction or could be new embolic infarctions. No mass effect or hemorrhage in this location. Extensive chronic small vessel ischemic changes elsewhere throughout the brain. Increased FLAIR signal in the sulci posteriorly could relate to increased protein content of the CSF.  MRI HEAD 08/27/2014  New subcentimeter acute infarct RIGHT corpus callosum. Acute on chronic patchy acute ischemia RIGHT frontal lobe. Chronic changes  including moderate to severe chronic small vessel ischemic disease. 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   MRA HEAD 08/27/2014  Moderately motion degraded examination. Overall poor flow related enhancement of the RIGHT internal carotid artery may reflect proximal high-grade stenosis. In addition, extremely slow flow versus occluded RIGHT supraclinoid internal carotid artery. Findings would be better characterized on CTA of the head and neck as clinically indicated. Moderate stenosis LEFT para clinoid internal carotid artery. High-grade stenosis RIGHT A1 segment. Overall limited assessment due to motion.   Carotid Doppler 08/02/2014 Bilateral: 1-39% ICA stenosis. Vertebral artery flow is antegrade. Left: Proximal portion of the ECA appears occluded   2D Echo 08/02/2014 EF 50-55%, no source of embolus  LE venous doppler - negative for DVT  TEE - No SOE Mild MR AV sclerosis EF 55% No LAA thrombus Moderate mural aortic debris Normal RA/RV No ASD/PFO Negative bubble study  Component     Latest Ref Rng 08/02/2014 08/28/2014  Cholesterol     0 - 200 mg/dL 130   Triglycerides     <150 mg/dL 125   HDL Cholesterol     >40 mg/dL 30 (L)   Total CHOL/HDL Ratio      4.3   VLDL     0 - 40 mg/dL 25   LDL (calc)     0 - 99  mg/dL 75   PTT Lupus Anticoagulant     0.0 - 50.0 sec  35.0  DRVVT     0.0 - 55.1 sec  36.4  Lupus Anticoag Interp       Comment:  Beta-2 Glyco I IgG     0 - 20 GPI IgG units  <9  Beta-2-Glycoprotein I IgM     0 - 32 GPI IgM units  <9  Beta-2-Glycoprotein I IgA     0 - 25 GPI IgA units  <9  Anticardiolipin IgG     0 - 14 GPL U/mL  <9  Anticardiolipin IgM     0 - 12 MPL U/mL  9  Anticardiolipin IgA     0 - 11 APL U/mL  <9  C-ANCA      Neg:<1:20 titer  <1:20  P-ANCA     Neg:<1:20 titer  <1:20  Atypical P-ANCA titer     Neg:<1:20 titer  <1:20  Hemoglobin A1C     4.8 - 5.6 % 6.4 (H)   Mean Plasma Glucose      137   Recommendations-F5LEID:       Comment  Comment       Comment  Recommendations-PTGENE:       Comment  Additional Information       Comment  Antithrombin Activity     75 - 120 %  85  Protein C Activity     74 - 151 %  160 (H)  Protein C, Total     70 - 140 %  97  Protein S Activity     60 - 145 %  82  Protein S Ag, Total     58 - 150 %  119  Homocysteine     0.0 - 15.0 umol/L  13.4  Rhuematoid fact SerPl-aCnc     0.0 - 13.9 IU/mL  <7.0  ANA Ab, IFA       Negative    Assessment: As you may recall, he is a 64 y.o. Caucasian male with PMH of  hypertension, hyperlipidemia, coronary artery disease with previous MI, COPD, congestive heart failure, substance abuse, alcohol abuse, tobacco abuse, history of DVT w/ poor medical compliance with anticoagulation follows up in stroke clinic for recurrent right brain strokes. He had right frontal punctate infarcts on 07/31/14 and then right small frontal infarcts on 08/19/14 and again admitted on 08/26/14 for right corpus collosum punctate infarct and MRA showed high grade stenosis at right cavernous ICA. All his infarcts are felt to be watershed in setting of hypotension and high grade right ICA stenosis although could not completely rule out cardio embolic source. Had TTE, TEE, CUS, DVT screening, hypercoagulable and autoimmune work up all negative. LDL 75 and A1C 6.4. He was continued on ASA and plavix and lipitor and medication compliance was again emphasized. He continued smoking and alcohol use, and his UDS showed positive for cocaine, opiates and benzo on admission. Due to his noncompliance with medication, illicit drug use, alcoholic, and hx of GIB on coumadin, no anticoagulation was recommended. During the interval time, he was doing well, stated compliance with  medication. However, he continued smoking, but decreased alcohol and no illicit drugs as per patient.  Plan:  - continue ASA and plavix and lipitor for stroke and cardiac prevention - check BP at home, BP goal 120-150 - Follow up with your primary care physician for stroke risk factor modification. Recommend maintain blood pressure goal <130/80, diabetes with hemoglobin A1c goal below  6.5% and lipids with LDL cholesterol goal below 70 mg/dL.  - if right ICA recurrent infarct, may consider right ICA cavernous segment stenting.  - quit smoking - Limited alcohol use - Abstain from cocaine - compliance with medication - RTC in 3 months  No orders of the defined types were placed in this encounter.    Meds ordered this encounter  Medications  . lisinopril (PRINIVIL,ZESTRIL) 5 MG tablet    Sig:     Refill:  2  . metoprolol tartrate (LOPRESSOR) 25 MG tablet    Sig:     Patient Instructions  - continue ASA and plavix and lipitor for stroke and cardiac prevention - check BP at home, BP goal 120-150 - Follow up with your primary care physician for stroke risk factor modification. Recommend maintain blood pressure goal <130/80, diabetes with hemoglobin A1c goal below 6.5% and lipids with LDL cholesterol goal below 70 mg/dL.  - if symptoms recurs, may consider brain stent.  - quit smoking - cut down alcohol - avoid cocaine - compliance with medication - OK to have eye surgery but avoid low BP before during and after the procedure.  - follow up in 3 months   Rosalin Hawking, MD PhD [facility] [address] [place], [place] [address] [phone]

## 2014-11-01 NOTE — Patient Instructions (Addendum)
-   continue ASA and plavix and lipitor for stroke and cardiac prevention - check BP at home, BP goal 120-150 - Follow up with your primary care physician for stroke risk factor modification. Recommend maintain blood pressure goal <130/80, diabetes with hemoglobin A1c goal below 6.5% and lipids with LDL cholesterol goal below 70 mg/dL.  - if symptoms recurs, may consider brain stent.  - quit smoking - cut down alcohol - avoid cocaine - compliance with medication - OK to have eye surgery but avoid low BP before during and after the procedure.  - follow up in 3 months

## 2014-11-07 ENCOUNTER — Ambulatory Visit: Payer: Self-pay | Admitting: Vascular Surgery

## 2014-11-07 ENCOUNTER — Encounter (HOSPITAL_COMMUNITY): Payer: Self-pay

## 2014-11-09 ENCOUNTER — Ambulatory Visit: Payer: Self-pay | Admitting: Interventional Cardiology

## 2014-11-20 ENCOUNTER — Encounter: Payer: Self-pay | Admitting: Vascular Surgery

## 2014-11-21 ENCOUNTER — Ambulatory Visit (HOSPITAL_COMMUNITY)
Admission: RE | Admit: 2014-11-21 | Discharge: 2014-11-21 | Disposition: A | Discharge status: Home / Self Care | Payer: Medicare Other | Source: Ambulatory Visit | Attending: Vascular Surgery | Admitting: Vascular Surgery

## 2014-11-21 ENCOUNTER — Other Ambulatory Visit: Payer: Self-pay

## 2014-11-21 ENCOUNTER — Encounter: Payer: Self-pay | Admitting: Vascular Surgery

## 2014-11-21 ENCOUNTER — Ambulatory Visit (INDEPENDENT_AMBULATORY_CARE_PROVIDER_SITE_OTHER): Payer: Medicare Other | Admitting: Vascular Surgery

## 2014-11-21 VITALS — BP 134/72 | HR 70 | Temp 97.6°F | Resp 20 | Ht 72.0 in | Wt 248.0 lb

## 2014-11-21 DIAGNOSIS — I739 Peripheral vascular disease, unspecified: Secondary | ICD-10-CM | POA: Diagnosis present

## 2014-11-21 DIAGNOSIS — I10 Essential (primary) hypertension: Secondary | ICD-10-CM | POA: Insufficient documentation

## 2014-11-21 DIAGNOSIS — I70209 Unspecified atherosclerosis of native arteries of extremities, unspecified extremity: Secondary | ICD-10-CM

## 2014-11-21 DIAGNOSIS — E785 Hyperlipidemia, unspecified: Secondary | ICD-10-CM | POA: Insufficient documentation

## 2014-11-21 DIAGNOSIS — Z716 Tobacco abuse counseling: Secondary | ICD-10-CM | POA: Diagnosis not present

## 2014-11-21 DIAGNOSIS — F17210 Nicotine dependence, cigarettes, uncomplicated: Secondary | ICD-10-CM | POA: Diagnosis not present

## 2014-11-21 NOTE — Progress Notes (Signed)
Vascular and Vein Specialist of [facility]  Patient name: [name] MRN: [id number] DOB: 1950/09/09 Sex: male  REASON FOR VISIT : bilateral leg pain and swelling. Referred by Dr. Darden Palmer.  HPI: [name] is a 64 y.o. male, who I last saw on 08/27/2012 with bilateral lower extremity pain and right leg swelling. This was a consult in the emergency department. He had evidence of diffuse peripheral vascular disease with continued tobacco use. He has known chronic DVT of the right lower extremity. He had undergone a previous aortobifemoral bypass graft in the late 1990s. He was then lost to follow up. He underwent a right femoral to above knee popliteal artery bypass with a PTFE graft in July 2012. He had a left femoropopliteal bypass graft done elsewhere. His right femoropopliteal bypass graft is chronically occluded. His left femoropopliteal bypass graft is also chronically occluded. His aortobifemoral bypass graft was patent.   Today he complains of significant claudication in the right calf which is brought on by ambulation and relieved with rest. He also has rest pain in the right foot. He denies any history of nonhealing ulcers.  He has a strong history of tobacco use and has been smoking for over 50 years. He had smoked up to 2 packs per day for many years but is now smoking about one pack per day.  I have reviewed his records from [facility]. He has a history of a CVA in the past with mild residual left-sided weakness. He has acute on chronic congestive heart failure with a most recent EF of 55%. He has a history of coronary artery disease and also COPD.   Past Medical History  Diagnosis Date  . Depression   . Hypertension   . Hyperlipidemia   . Myocardial infarction ([place]) 2000  . CHF (congestive heart failure) ([place])   . COPD (chronic obstructive pulmonary disease) ([place])   . Peripheral vascular disease, unspecified ([place])     08/20/10  doppler: increase in right ABI post-op. Left ABI stable. S/P bi-fem bypass surgery  . GERD (gastroesophageal reflux disease)   . Asthma   . Anxiety   . History of DVT of lower extremity   . CAD (coronary artery disease) 05/27/10    Cath: severe single vessell CAD left cx midportion obtuse marginal 2 to 3.  . Shortness of breath   . Pneumonia 04/04/2012  . Arthritis   . OSA on CPAP   . Hyperlipemia   . COPD (chronic obstructive pulmonary disease) ([place])   . Alcohol abuse     H/O  . Tobacco abuse   . Orbital fracture ([place]) 12/2012  . Stroke [facility])    Family History  Problem Relation Age of Onset  . Heart attack Mother   . Cirrhosis Father   . Heart failure Mother   . Heart failure Brother   . Cancer Brother    SOCIAL HISTORY: Social History   Social History  . Marital Status: Single    Spouse Name: N/A  . Number of Children: N/A  . Years of Education: N/A   Occupational History  . Not on file.   Social History Main Topics  . Smoking status: Current Every Day Smoker -- 1.00 packs/day for 50 years    Types: Cigars, Cigarettes    Start date: 02/09/1961  . Smokeless tobacco: Former Systems developer    Types: Chew     Comment: 2 ppd, full flavor  . Alcohol Use: 0.6 oz/week  1 Cans of beer per week     Comment: 10/24/14 no drinking for 2 weeks  . Drug Use: No  . Sexual Activity: Yes   Other Topics Concern  . Not on file   Social History Narrative   Recovering alcoholic   Allergies  Allergen Reactions  . Darvocet [Propoxyphene N-Acetaminophen] Hives  . Haldol [Haloperidol Decanoate] Hives  . Acetaminophen Nausea Only    Upset stomach, tolerates Hydrocodone/APAP if taken with food   Current Outpatient Prescriptions  Medication Sig Dispense Refill  . albuterol (PROVENTIL HFA;VENTOLIN HFA) 108 (90 BASE) MCG/ACT inhaler Inhale 2 puffs into the lungs every 6 (six) hours as needed for wheezing or shortness of breath. 1 Inhaler 3  . albuterol (PROVENTIL) (2.5 MG/3ML) 0.083%  nebulizer solution Take 2.5 mg by nebulization 2 (two) times daily as needed for wheezing or shortness of breath.    [name] aspirin EC 325 MG EC tablet Take 1 tablet (325 mg total) by mouth daily. 30 tablet 0  . atorvastatin (LIPITOR) 40 MG tablet Take 1 tablet (40 mg total) by mouth daily at 6 PM. 30 tablet 2  . carvedilol (COREG) 6.25 MG tablet Take 1 tablet (6.25 mg total) by mouth 2 (two) times daily with a meal. 60 tablet 3  . clopidogrel (PLAVIX) 75 MG tablet Take 1 tablet (75 mg total) by mouth daily. 30 tablet 2  . furosemide (LASIX) 40 MG tablet Take 1 tablet (40 mg total) by mouth 2 (two) times daily. 60 tablet 2  . lisinopril (PRINIVIL,ZESTRIL) 5 MG tablet   2  . metoprolol tartrate (LOPRESSOR) 25 MG tablet     . mometasone-formoterol (DULERA) 200-5 MCG/ACT AERO Inhale 2 puffs into the lungs 2 (two) times daily. 13 g 3  . omeprazole (PRILOSEC) 20 MG capsule Take 1 capsule (20 mg total) by mouth daily. 30 capsule 11  . tiotropium (SPIRIVA) 18 MCG inhalation capsule Place 1 capsule (18 mcg total) into inhaler and inhale daily. 30 capsule 3  . tiZANidine (ZANAFLEX) 4 MG tablet Take 1 tablet (4 mg total) by mouth 3 (three) times daily. 90 tablet 1  . traMADol (ULTRAM) 50 MG tablet Take 1 tablet (50 mg total) by mouth every 8 (eight) hours as needed. 60 tablet 2  . Pyridoxine HCl (VITAMIN B-6 PO) Take 1 tablet by mouth daily.    . vitamin B-12 (CYANOCOBALAMIN) 100 MCG tablet Take 100 mcg by mouth daily.     No current facility-administered medications for this visit.   REVIEW OF SYSTEMS:  '[X]'$  denotes positive finding, '[ ]'$  denotes negative finding Cardiac  Comments:  Chest pain or chest pressure:    Shortness of breath upon exertion:    Short of breath when lying flat:    Irregular heart rhythm:        Vascular    Pain in calf, thigh, or hip brought on by ambulation: X   Pain in feet at night that wakes you up from your sleep:     Blood clot in your veins: X History of chronic DVT of  right lower extremity.  Leg swelling:  X       Pulmonary    Oxygen at home:    Productive cough:     Wheezing:         Neurologic    Sudden weakness in arms or legs:     Sudden numbness in arms or legs:     Sudden onset of difficulty speaking or slurred speech:  Temporary loss of vision in one eye:     Problems with dizziness:         Gastrointestinal    Blood in stool:     Vomited blood:         Genitourinary    Burning when urinating:     Blood in urine:        Psychiatric    Major depression:         Hematologic    Bleeding problems:    Problems with blood clotting too easily:        Skin    Rashes or ulcers:        Constitutional    Fever or chills:      PHYSICAL EXAM: Filed Vitals:   11/21/14 1052  BP: 134/72  Pulse: 70  Temp: 97.6 F (36.4 C)  Resp: 20  Height: 6' (1.829 m)  Weight: 248 lb (112.492 kg)  SpO2: 96%   GENERAL: The patient is a well-nourished male, in no acute distress. The vital signs are documented above. CARDIAC: There is a regular rate and rhythm.  VASCULAR: I do not detect carotid bruits. He has palpable femoral pulses. I cannot palpate popliteal or pedal pulses. PULMONARY: There is good air exchange bilaterally without wheezing or rales. ABDOMEN: Soft and non-tender with normal pitched bowel sounds.  MUSCULOSKELETAL: There are no major deformities or cyanosis. NEUROLOGIC: No focal weakness or paresthesias are detected. SKIN: There are no ulcers or rashes noted. PSYCHIATRIC: The patient has a normal affect.  DATA:  ARTERIAL DOPPLER: Cammie Mcgee interpreted as arterial Doppler study today which shows monophasic Doppler signals in the right posterior tibial and dorsalis pedis positions with an ABI of 32%. He has biphasic Doppler signals in the left foot and the dorsalis pedis and posterior tibial positions with an ABI of 65%.  I have reviewed his venous duplex scan from 08/29/2014 which shows no evidence of DVT involving the right  lower extremity. There was no evidence of DVT involving the left lower extremity also.  MEDICAL ISSUES:  RIGHT INFRAINGUINAL ARTERIAL OCCLUSIVE DISEASE: This patient has an occluded right femoropopliteal bypass graft was done with PTFE as he had no vein available. He has a patent aortobifemoral bypass graft. He has progressive ischemia of the right lower extremity with rest pain. I have recommended arteriography to evaluate his options. I have reviewed with the patient the indications for arteriography. In addition, I have reviewed the potential complications of arteriography including but not limited to: Bleeding, arterial injury, arterial thrombosis, dye action, renal insufficiency, or other unpredictable medical problems. I have explained to the patient that if we find disease amenable to angioplasty we could potentially address this at the same time. I have discussed the potential complications of angioplasty and stenting, including but not limited to: Bleeding, arterial thrombosis, arterial injury, dissection, or the need for surgical intervention. We have had a long discussion, greater than 3 minutes, about the importance of tobacco cessation given that he is at significant risk for limb loss on the right. I will make further recommendations pending the results of his arteriogram.   Deitra Mayo Vascular and Vein Specialists of [facility]: [phone]

## 2014-11-23 ENCOUNTER — Encounter: Payer: Self-pay | Admitting: Cardiology

## 2014-11-23 ENCOUNTER — Ambulatory Visit (INDEPENDENT_AMBULATORY_CARE_PROVIDER_SITE_OTHER): Payer: Medicare Other | Admitting: Cardiology

## 2014-11-23 VITALS — BP 120/75 | HR 71 | Ht 72.0 in | Wt 244.0 lb

## 2014-11-23 DIAGNOSIS — Z720 Tobacco use: Secondary | ICD-10-CM

## 2014-11-23 DIAGNOSIS — I739 Peripheral vascular disease, unspecified: Secondary | ICD-10-CM | POA: Diagnosis not present

## 2014-11-23 DIAGNOSIS — R072 Precordial pain: Secondary | ICD-10-CM

## 2014-11-23 DIAGNOSIS — R0609 Other forms of dyspnea: Secondary | ICD-10-CM

## 2014-11-23 DIAGNOSIS — R0600 Dyspnea, unspecified: Secondary | ICD-10-CM

## 2014-11-23 DIAGNOSIS — I480 Paroxysmal atrial fibrillation: Secondary | ICD-10-CM | POA: Insufficient documentation

## 2014-11-23 DIAGNOSIS — F17200 Nicotine dependence, unspecified, uncomplicated: Secondary | ICD-10-CM

## 2014-11-23 DIAGNOSIS — Z01818 Encounter for other preprocedural examination: Secondary | ICD-10-CM

## 2014-11-23 HISTORY — DX: Other forms of dyspnea: R06.09

## 2014-11-23 HISTORY — DX: Dyspnea, unspecified: R06.00

## 2014-11-23 NOTE — Patient Instructions (Signed)
Medication Instructions:   Your physician recommends that you continue on your current medications as directed. Please refer to the Current Medication list given to you today.     Testing/Procedures:  Your physician has requested that you have a lexiscan myoview. For further information please visit [email or web address]. Please follow instruction sheet, as given.     Follow-Up:  ONE MONTH WITH DR Meda Coffee

## 2014-11-23 NOTE — Progress Notes (Signed)
Patient ID: [name], male   DOB: 10-Jan-1951, 64 y.o.   MRN: [id number]      Cardiology Office Note  Date:  11/23/2014   ID:  [name], DOB Apr 27, 1950, MRN [id number]  PCP:  Arnoldo Morale, MD  Cardiologist:  Dorothy Spark, MD   History of Present Illness: [name] is a 64 y.o. male who presents for preoperative evaluation for possible vascular intervention/surgery. He has h/o aortobifemoral bypass graft now with progressive ischemia of the right lower extremity with rest pain. Scheduled for a arteriogram on 12/10/2014. The patient is a lifetime smoker, cocaine user, with h/o HTN, hyperlipidemia and CVA in June 2016.  He is minimally active as he is limited by severe claudications. He describes chest pain at rest - left sided pressure like radiating to his let shoulder. SOB all the time for years. No syncope, occasional palpitations.    Past Medical History  Diagnosis Date  . Depression   . Hypertension   . Hyperlipidemia   . Myocardial infarction ([place]) 2000  . CHF (congestive heart failure) ([place])   . COPD (chronic obstructive pulmonary disease) ([place])   . Peripheral vascular disease, unspecified ([place])     08/20/10 doppler: increase in right ABI post-op. Left ABI stable. S/P bi-fem bypass surgery  . GERD (gastroesophageal reflux disease)   . Asthma   . Anxiety   . History of DVT of lower extremity   . CAD (coronary artery disease) 05/27/10    Cath: severe single vessell CAD left cx midportion obtuse marginal 2 to 3.  . Shortness of breath   . Pneumonia 04/04/2012  . Arthritis   . OSA on CPAP   . Hyperlipemia   . COPD (chronic obstructive pulmonary disease) ([place])   . Alcohol abuse     H/O  . Tobacco abuse   . Orbital fracture ([place]) 12/2012  . Stroke [facility])    Past Surgical History  Procedure Laterality Date  . Femoral bypass  08/19/10    Right Fem-Pop  . Aortogram w/ ptca  09/292003  . Eye surgery    . Back surgery    . Total knee  arthroplasty    . Cholecystectomy    . Cardiac catheterization  05/27/10    severe CAD left cx  . Abdoninal ao angio & bifem angio  05/27/10    Patent graft, occluded bil stents with no retrograde flow into the hypogastric arteries. 100% occl left ant. tibial artery, 70% to 80% to 100% stenosis right superficial fem artery above adductor canal. 100 % occl right ant. tibial vessell  . Esophagogastric fundoplication    . Tee without cardioversion N/A 08/29/2014    Procedure: TRANSESOPHAGEAL ECHOCARDIOGRAM (TEE);  Surgeon: Josue Hector, MD;  Location: [facility] ENDOSCOPY;  Service: Cardiovascular;  Laterality: N/A;   Current Outpatient Prescriptions  Medication Sig Dispense Refill  . albuterol (PROVENTIL HFA;VENTOLIN HFA) 108 (90 BASE) MCG/ACT inhaler Inhale 2 puffs into the lungs every 6 (six) hours as needed for wheezing or shortness of breath. 1 Inhaler 3  . albuterol (PROVENTIL) (2.5 MG/3ML) 0.083% nebulizer solution Take 2.5 mg by nebulization 2 (two) times daily as needed for wheezing or shortness of breath.    [name] aspirin EC 325 MG EC tablet Take 1 tablet (325 mg total) by mouth daily. 30 tablet 0  . atorvastatin (LIPITOR) 40 MG tablet Take 1 tablet (40 mg total) by mouth daily at 6 PM. 30 tablet 2  . carvedilol (COREG) 6.25 MG tablet  Take 1 tablet (6.25 mg total) by mouth 2 (two) times daily with a meal. 60 tablet 3  . clopidogrel (PLAVIX) 75 MG tablet Take 1 tablet (75 mg total) by mouth daily. 30 tablet 2  . DUREZOL 0.05 % EMUL 1 drop in left eye three times a day  1  . furosemide (LASIX) 40 MG tablet Take 1 tablet (40 mg total) by mouth 2 (two) times daily. 60 tablet 2  . ILEVRO 0.3 % ophthalmic suspension 1 drop in left eye at bedtime  1  . metoprolol tartrate (LOPRESSOR) 25 MG tablet Take 25 mg by mouth 2 (two) times daily.     . mometasone-formoterol (DULERA) 200-5 MCG/ACT AERO Inhale 2 puffs into the lungs 2 (two) times daily. 13 g 3  . omeprazole (PRILOSEC) 20 MG capsule Take 1 capsule (20  mg total) by mouth daily. 30 capsule 11  . tiotropium (SPIRIVA) 18 MCG inhalation capsule Place 1 capsule (18 mcg total) into inhaler and inhale daily. 30 capsule 3  . tiZANidine (ZANAFLEX) 4 MG tablet Take 1 tablet (4 mg total) by mouth 3 (three) times daily. 90 tablet 1  . traMADol (ULTRAM) 50 MG tablet Take 50 mg by mouth every 6 (six) hours as needed for moderate pain.     No current facility-administered medications for this visit.    Allergies:   Darvocet; Haldol; and Acetaminophen    Social History:  The patient  reports that he has been smoking Cigars and Cigarettes.  He started smoking about 53 years ago. He has a 50 pack-year smoking history. He has quit using smokeless tobacco. His smokeless tobacco use included Chew. He reports that he drinks about 0.6 oz of alcohol per week. He reports that he does not use illicit drugs.   Family History:  The patient's family history includes Cancer in his brother; Cirrhosis in his father; Heart attack in his mother; Heart failure in his brother and mother. There is no history of Hypertension or Stroke.    ROS:  Please see the history of present illness.   Otherwise, review of systems are positive for none.   All other systems are reviewed and negative.    PHYSICAL EXAM: VS:  BP 120/75 mmHg  Pulse 71  Ht 6' (1.829 m)  Wt 244 lb (110.678 kg)  BMI 33.09 kg/m2 , BMI Body mass index is 33.09 kg/(m^2). GEN: Well nourished, disheveled, in no acute distress HEENT: normal Neck: no JVD, mild right carotid bruits, or masses Cardiac: RRR; 2/6 systolic murmur murmurs, rubs, or gallops,no edema  Respiratory:  clear to auscultation bilaterally, normal work of breathing GI: soft, nontender, nondistended, + BS MS: no deformity or atrophy Skin: warm and dry, no rash Neuro:  Strength and sensation are intact Psych: euthymic mood, full affect   EKG:  EKG is ordered today. The ekg ordered today demonstrates SR, normal ECG.  Recent Labs: 08/19/2014:  B Natriuretic Peptide 65.4 08/26/2014: ALT 26; Hemoglobin 17.0; Platelets 175 09/18/2014: Pro B Natriuretic peptide (BNP) 95.95 10/24/2014: BUN 15; Creat 1.11; Potassium 6.0*; Sodium 149*   Lipid Panel    Component Value Date/Time   CHOL 130 08/02/2014 0445   TRIG 125 08/02/2014 0445   HDL 30* 08/02/2014 0445   CHOLHDL 4.3 08/02/2014 0445   VLDL 25 08/02/2014 0445   LDLCALC 75 08/02/2014 0445   Wt Readings from Last 3 Encounters:  11/23/14 244 lb (110.678 kg)  11/21/14 248 lb (112.492 kg)  11/01/14 245 lb 12.8 oz (111.494 kg)  TTE: 08/02/2014 Left ventricle: The cavity size was normal. There was mild concentric hypertrophy. Systolic function was normal. The estimated ejection fraction was in the range of 50% to 55%. Wall motion was normal; there were no regional wall motion abnormalities. Doppler parameters are consistent with abnormal left ventricular relaxation (grade 1 diastolic dysfunction). - Left atrium: The atrium was moderately dilated.    ASSESSMENT AND PLAN:  1.  SOB, chest pain - risk factors - smoking, HTN, cocaine abuse, PAD - we will schedule Lexiscan nuclear stress test. Continue taking asa 81 mg po daily, currently taking 3 325 mg aspirins daily.   2. Hypertension - controlled  3. Hyperlipidemia - on 40 mg po atorvastatin daily  4. H/O CVA - continue plavix, instructed to decrease dose of ASA to 81 mg po daily  5. Preop evaluation - we will reevaluate once we have results of stress test.  Follow up in 1 month.  Signed, Dorothy Spark, MD  11/23/2014 9:42 AM    [facility] [address], [place], [place]  [address] Phone: [phone]; Fax: [phone]

## 2014-11-29 ENCOUNTER — Telehealth (HOSPITAL_COMMUNITY): Payer: Self-pay | Admitting: *Deleted

## 2014-11-29 NOTE — Telephone Encounter (Signed)
Attempted to call patient regarding upcoming appointment- no answer x 2. Hubbard Robinson, RN

## 2014-12-03 ENCOUNTER — Telehealth (HOSPITAL_COMMUNITY): Payer: Self-pay

## 2014-12-03 NOTE — Telephone Encounter (Signed)
Patient given detailed instructions per Myocardial Perfusion Study Information Sheet for the test on 12-04-2014 at 0945. Patient notified to arrive 15 minutes early and that it is imperative to arrive on time for appointment to keep from having the test rescheduled.  If you need to cancel or reschedule your appointment, please call the office within 24 hours of your appointment. Failure to do so may result in a cancellation of your appointment, and a $50 no show fee. Patient verbalized understanding.Oletta Lamas, Danyl Deems A

## 2014-12-04 ENCOUNTER — Ambulatory Visit (HOSPITAL_COMMUNITY): Payer: Medicare Other | Attending: Cardiology

## 2014-12-04 DIAGNOSIS — R0602 Shortness of breath: Secondary | ICD-10-CM | POA: Insufficient documentation

## 2014-12-04 DIAGNOSIS — R079 Chest pain, unspecified: Secondary | ICD-10-CM | POA: Diagnosis not present

## 2014-12-04 DIAGNOSIS — R0609 Other forms of dyspnea: Secondary | ICD-10-CM

## 2014-12-04 DIAGNOSIS — I739 Peripheral vascular disease, unspecified: Secondary | ICD-10-CM | POA: Diagnosis not present

## 2014-12-04 DIAGNOSIS — F17200 Nicotine dependence, unspecified, uncomplicated: Secondary | ICD-10-CM | POA: Diagnosis not present

## 2014-12-04 DIAGNOSIS — I10 Essential (primary) hypertension: Secondary | ICD-10-CM | POA: Diagnosis not present

## 2014-12-04 DIAGNOSIS — R9439 Abnormal result of other cardiovascular function study: Secondary | ICD-10-CM | POA: Diagnosis not present

## 2014-12-04 DIAGNOSIS — R002 Palpitations: Secondary | ICD-10-CM | POA: Diagnosis not present

## 2014-12-04 DIAGNOSIS — I779 Disorder of arteries and arterioles, unspecified: Secondary | ICD-10-CM | POA: Diagnosis not present

## 2014-12-04 LAB — MYOCARDIAL PERFUSION IMAGING
LV dias vol: 118 mL
LV sys vol: 62 mL
Peak HR: 94 {beats}/min
RATE: 0.31
Rest HR: 76 {beats}/min
SDS: 0
SRS: 3
SSS: 3
TID: 1.05

## 2014-12-04 MED ORDER — REGADENOSON 0.4 MG/5ML IV SOLN
0.4000 mg | Freq: Once | INTRAVENOUS | Status: AC
  Administered 2014-12-04: 0.4 mg via INTRAVENOUS

## 2014-12-04 MED ORDER — TECHNETIUM TC 99M SESTAMIBI GENERIC - CARDIOLITE
32.6000 | Freq: Once | INTRAVENOUS | Status: AC | PRN
  Administered 2014-12-04: 33 via INTRAVENOUS

## 2014-12-04 MED ORDER — TECHNETIUM TC 99M SESTAMIBI GENERIC - CARDIOLITE
10.9000 | Freq: Once | INTRAVENOUS | Status: AC | PRN
  Administered 2014-12-04: 10.9 via INTRAVENOUS

## 2014-12-10 ENCOUNTER — Encounter (HOSPITAL_COMMUNITY)
Admission: RE | Disposition: A | Discharge status: Home / Self Care | Payer: Self-pay | Source: Ambulatory Visit | Attending: Vascular Surgery

## 2014-12-10 ENCOUNTER — Ambulatory Visit (HOSPITAL_COMMUNITY)
Admission: RE | Admit: 2014-12-10 | Discharge: 2014-12-10 | Disposition: A | Discharge status: Home / Self Care | Payer: Medicare Other | Source: Ambulatory Visit | Attending: Vascular Surgery | Admitting: Vascular Surgery

## 2014-12-10 DIAGNOSIS — F329 Major depressive disorder, single episode, unspecified: Secondary | ICD-10-CM | POA: Insufficient documentation

## 2014-12-10 DIAGNOSIS — Z86718 Personal history of other venous thrombosis and embolism: Secondary | ICD-10-CM | POA: Diagnosis not present

## 2014-12-10 DIAGNOSIS — I70223 Atherosclerosis of native arteries of extremities with rest pain, bilateral legs: Secondary | ICD-10-CM | POA: Diagnosis not present

## 2014-12-10 DIAGNOSIS — F419 Anxiety disorder, unspecified: Secondary | ICD-10-CM | POA: Diagnosis not present

## 2014-12-10 DIAGNOSIS — E669 Obesity, unspecified: Secondary | ICD-10-CM | POA: Insufficient documentation

## 2014-12-10 DIAGNOSIS — I2510 Atherosclerotic heart disease of native coronary artery without angina pectoris: Secondary | ICD-10-CM | POA: Insufficient documentation

## 2014-12-10 DIAGNOSIS — F17210 Nicotine dependence, cigarettes, uncomplicated: Secondary | ICD-10-CM | POA: Diagnosis not present

## 2014-12-10 DIAGNOSIS — J449 Chronic obstructive pulmonary disease, unspecified: Secondary | ICD-10-CM | POA: Diagnosis not present

## 2014-12-10 DIAGNOSIS — Z7982 Long term (current) use of aspirin: Secondary | ICD-10-CM | POA: Diagnosis not present

## 2014-12-10 DIAGNOSIS — K219 Gastro-esophageal reflux disease without esophagitis: Secondary | ICD-10-CM | POA: Diagnosis not present

## 2014-12-10 DIAGNOSIS — Z8249 Family history of ischemic heart disease and other diseases of the circulatory system: Secondary | ICD-10-CM | POA: Insufficient documentation

## 2014-12-10 DIAGNOSIS — Z7902 Long term (current) use of antithrombotics/antiplatelets: Secondary | ICD-10-CM | POA: Insufficient documentation

## 2014-12-10 DIAGNOSIS — J45909 Unspecified asthma, uncomplicated: Secondary | ICD-10-CM | POA: Insufficient documentation

## 2014-12-10 DIAGNOSIS — E785 Hyperlipidemia, unspecified: Secondary | ICD-10-CM | POA: Insufficient documentation

## 2014-12-10 DIAGNOSIS — G4733 Obstructive sleep apnea (adult) (pediatric): Secondary | ICD-10-CM | POA: Diagnosis not present

## 2014-12-10 DIAGNOSIS — I509 Heart failure, unspecified: Secondary | ICD-10-CM | POA: Diagnosis not present

## 2014-12-10 DIAGNOSIS — Z8673 Personal history of transient ischemic attack (TIA), and cerebral infarction without residual deficits: Secondary | ICD-10-CM | POA: Diagnosis not present

## 2014-12-10 DIAGNOSIS — I110 Hypertensive heart disease with heart failure: Secondary | ICD-10-CM | POA: Diagnosis not present

## 2014-12-10 DIAGNOSIS — I70213 Atherosclerosis of native arteries of extremities with intermittent claudication, bilateral legs: Secondary | ICD-10-CM | POA: Diagnosis not present

## 2014-12-10 DIAGNOSIS — I252 Old myocardial infarction: Secondary | ICD-10-CM | POA: Diagnosis not present

## 2014-12-10 DIAGNOSIS — I739 Peripheral vascular disease, unspecified: Secondary | ICD-10-CM | POA: Diagnosis present

## 2014-12-10 HISTORY — PX: PERIPHERAL VASCULAR CATHETERIZATION: SHX172C

## 2014-12-10 LAB — POCT I-STAT, CHEM 8
BUN: 19 mg/dL (ref 6–20)
CALCIUM ION: 1.26 mmol/L (ref 1.13–1.30)
Chloride: 105 mmol/L (ref 101–111)
Creatinine, Ser: 0.9 mg/dL (ref 0.61–1.24)
Glucose, Bld: 127 mg/dL — ABNORMAL HIGH (ref 65–99)
HCT: 49 % (ref 39.0–52.0)
HEMOGLOBIN: 16.7 g/dL (ref 13.0–17.0)
Potassium: 4 mmol/L (ref 3.5–5.1)
SODIUM: 144 mmol/L (ref 135–145)
TCO2: 25 mmol/L (ref 0–100)

## 2014-12-10 LAB — GLUCOSE, CAPILLARY: GLUCOSE-CAPILLARY: 126 mg/dL — AB (ref 65–99)

## 2014-12-10 SURGERY — ABDOMINAL AORTOGRAM

## 2014-12-10 MED ORDER — DIPHENHYDRAMINE HCL 50 MG/ML IJ SOLN
INTRAMUSCULAR | Status: AC
  Filled 2014-12-10: qty 1

## 2014-12-10 MED ORDER — HEPARIN (PORCINE) IN NACL 2-0.9 UNIT/ML-% IJ SOLN
INTRAMUSCULAR | Status: AC
  Filled 2014-12-10: qty 1000

## 2014-12-10 MED ORDER — LIDOCAINE HCL (PF) 1 % IJ SOLN
INTRAMUSCULAR | Status: AC
  Filled 2014-12-10: qty 30

## 2014-12-10 MED ORDER — FENTANYL CITRATE (PF) 100 MCG/2ML IJ SOLN
INTRAMUSCULAR | Status: AC
  Filled 2014-12-10: qty 4

## 2014-12-10 MED ORDER — SODIUM CHLORIDE 0.9 % IV SOLN
INTRAVENOUS | Status: DC
  Administered 2014-12-10: 08:00:00 via INTRAVENOUS

## 2014-12-10 MED ORDER — METHYLPREDNISOLONE SODIUM SUCC 125 MG IJ SOLR
125.0000 mg | INTRAMUSCULAR | Status: AC
  Administered 2014-12-10: 125 mg via INTRAVENOUS

## 2014-12-10 MED ORDER — MIDAZOLAM HCL 2 MG/2ML IJ SOLN
INTRAMUSCULAR | Status: DC | PRN
  Administered 2014-12-10: 1 mg via INTRAVENOUS

## 2014-12-10 MED ORDER — LABETALOL HCL 5 MG/ML IV SOLN
INTRAVENOUS | Status: AC
  Filled 2014-12-10: qty 4

## 2014-12-10 MED ORDER — SODIUM CHLORIDE 0.9 % IV SOLN: 1.0000 mL/kg/h | INTRAVENOUS | Status: DC

## 2014-12-10 MED ORDER — FAMOTIDINE IN NACL 20-0.9 MG/50ML-% IV SOLN
20.0000 mg | INTRAVENOUS | Status: AC
  Administered 2014-12-10: 20 mg via INTRAVENOUS

## 2014-12-10 MED ORDER — DIPHENHYDRAMINE HCL 50 MG/ML IJ SOLN
25.0000 mg | INTRAMUSCULAR | Status: AC
  Administered 2014-12-10: 25 mg via INTRAVENOUS

## 2014-12-10 MED ORDER — LABETALOL HCL 5 MG/ML IV SOLN
10.0000 mg | INTRAVENOUS | Status: DC | PRN
  Administered 2014-12-10: 10 mg via INTRAVENOUS

## 2014-12-10 MED ORDER — FENTANYL CITRATE (PF) 100 MCG/2ML IJ SOLN
INTRAMUSCULAR | Status: DC | PRN
  Administered 2014-12-10: 50 ug via INTRAVENOUS

## 2014-12-10 MED ORDER — LIDOCAINE HCL (PF) 1 % IJ SOLN
INTRAMUSCULAR | Status: DC | PRN
  Administered 2014-12-10: 10:00:00

## 2014-12-10 MED ORDER — FAMOTIDINE IN NACL 20-0.9 MG/50ML-% IV SOLN
INTRAVENOUS | Status: AC
  Filled 2014-12-10: qty 50

## 2014-12-10 MED ORDER — IODIXANOL 320 MG/ML IV SOLN
INTRAVENOUS | Status: DC | PRN
  Administered 2014-12-10: 147 mL via INTRA_ARTERIAL

## 2014-12-10 MED ORDER — MIDAZOLAM HCL 2 MG/2ML IJ SOLN
INTRAMUSCULAR | Status: AC
  Filled 2014-12-10: qty 4

## 2014-12-10 MED ORDER — METHYLPREDNISOLONE SODIUM SUCC 125 MG IJ SOLR
INTRAMUSCULAR | Status: AC
  Filled 2014-12-10: qty 2

## 2014-12-10 SURGICAL SUPPLY — 13 items
CATH ANGIO 5F PIGTAIL 65CM (CATHETERS) ×3 IMPLANT
CATH SOFTOUCH MOTARJEME 5F (CATHETERS) ×3 IMPLANT
COVER PRB 48X5XTLSCP FOLD TPE (BAG) ×2 IMPLANT
COVER PROBE 5X48 (BAG) ×4
GUIDEWIRE ANGLED .035X150CM (WIRE) ×3 IMPLANT
KIT MICROINTRODUCER STIFF 5F (SHEATH) ×3 IMPLANT
KIT PV (KITS) ×3 IMPLANT
SHEATH PINNACLE 5F 10CM (SHEATH) ×3 IMPLANT
SYR MEDRAD MARK V 150ML (SYRINGE) ×3 IMPLANT
TRANSDUCER W/STOPCOCK (MISCELLANEOUS) ×3 IMPLANT
TRAY PV CATH (CUSTOM PROCEDURE TRAY) ×3 IMPLANT
WIRE HITORQ VERSACORE ST 145CM (WIRE) ×3 IMPLANT
WIRE ROSEN-J .035X180CM (WIRE) ×3 IMPLANT

## 2014-12-10 NOTE — Interval H&P Note (Signed)
History and Physical Interval Note:  12/10/2014 9:26 AM  [name]  has presented today for surgery, with the diagnosis of pvd w/ bilateral lower claudication  The various methods of treatment have been discussed with the patient and family. After consideration of risks, benefits and other options for treatment, the patient has consented to  Procedure(s): Abdominal Aortogram (N/A) as a surgical intervention .  The patient's history has been reviewed, patient examined, no change in status, stable for surgery.  I have reviewed the patient's chart and labs.  Questions were answered to the patient's satisfaction.     Deitra Mayo

## 2014-12-10 NOTE — Progress Notes (Signed)
Up and walked and tolerated well; right groin stable no bleeding or hematoma 

## 2014-12-10 NOTE — Progress Notes (Signed)
Site area: right groin fa sheath Site Prior to Removal:  Level 0 Pressure Applied For:  20 minutes Manual:   yes Patient Status During Pull:  stable Post Pull Site:  Level  0 Post Pull Instructions Given:  yes Post Pull Pulses Present: yes Dressing Applied:  tegaderm Bedrest begins @   7129 Comments:  0

## 2014-12-10 NOTE — H&P (View-Only) (Signed)
Vascular and Vein Specialist of [facility]  Patient name: [name] MRN: [id number] DOB: [birth year] Sex: male  REASON FOR VISIT : bilateral leg pain and swelling. Referred by Dr. Darden Palmer.  HPI: [name] is a 64 y.o. male, who I last saw on 08/27/2012 with bilateral lower extremity pain and right leg swelling. This was a consult in the emergency department. He had evidence of diffuse peripheral vascular disease with continued tobacco use. He has known chronic DVT of the right lower extremity. He had undergone a previous aortobifemoral bypass graft in the late 1990s. He was then lost to follow up. He underwent a right femoral to above knee popliteal artery bypass with a PTFE graft in July 2012. He had a left femoropopliteal bypass graft done elsewhere. His right femoropopliteal bypass graft is chronically occluded. His left femoropopliteal bypass graft is also chronically occluded. His aortobifemoral bypass graft was patent.   Today he complains of significant claudication in the right calf which is brought on by ambulation and relieved with rest. He also has rest pain in the right foot. He denies any history of nonhealing ulcers.  He has a strong history of tobacco use and has been smoking for over 50 years. He had smoked up to 2 packs per day for many years but is now smoking about one pack per day.  I have reviewed his records from [facility]. He has a history of a CVA in the past with mild residual left-sided weakness. He has acute on chronic congestive heart failure with a most recent EF of 55%. He has a history of coronary artery disease and also COPD.   Past Medical History  Diagnosis Date  . Depression   . Hypertension   . Hyperlipidemia   . Myocardial infarction ([place]) 2000  . CHF (congestive heart failure) ([place])   . COPD (chronic obstructive pulmonary disease) ([place])   . Peripheral vascular disease, unspecified ([place])     08/20/10  doppler: increase in right ABI post-op. Left ABI stable. S/P bi-fem bypass surgery  . GERD (gastroesophageal reflux disease)   . Asthma   . Anxiety   . History of DVT of lower extremity   . CAD (coronary artery disease) 05/27/10    Cath: severe single vessell CAD left cx midportion obtuse marginal 2 to 3.  . Shortness of breath   . Pneumonia 04/04/2012  . Arthritis   . OSA on CPAP   . Hyperlipemia   . COPD (chronic obstructive pulmonary disease) ([place])   . Alcohol abuse     H/O  . Tobacco abuse   . Orbital fracture ([place]) 12/2012  . Stroke [facility])    Family History  Problem Relation Age of Onset  . Heart attack Mother   . Cirrhosis Father   . Heart failure Mother   . Heart failure Brother   . Cancer Brother    SOCIAL HISTORY: Social History   Social History  . Marital Status: Single    Spouse Name: N/A  . Number of Children: N/A  . Years of Education: N/A   Occupational History  . Not on file.   Social History Main Topics  . Smoking status: Current Every Day Smoker -- 1.00 packs/day for 50 years    Types: Cigars, Cigarettes    Start date: 02/09/1961  . Smokeless tobacco: Former Systems developer    Types: Chew     Comment: 2 ppd, full flavor  . Alcohol Use: 0.6 oz/week  1 Cans of beer per week     Comment: 10/24/14 no drinking for 2 weeks  . Drug Use: No  . Sexual Activity: Yes   Other Topics Concern  . Not on file   Social History Narrative   Recovering alcoholic   Allergies  Allergen Reactions  . Darvocet [Propoxyphene N-Acetaminophen] Hives  . Haldol [Haloperidol Decanoate] Hives  . Acetaminophen Nausea Only    Upset stomach, tolerates Hydrocodone/APAP if taken with food   Current Outpatient Prescriptions  Medication Sig Dispense Refill  . albuterol (PROVENTIL HFA;VENTOLIN HFA) 108 (90 BASE) MCG/ACT inhaler Inhale 2 puffs into the lungs every 6 (six) hours as needed for wheezing or shortness of breath. 1 Inhaler 3  . albuterol (PROVENTIL) (2.5 MG/3ML) 0.083%  nebulizer solution Take 2.5 mg by nebulization 2 (two) times daily as needed for wheezing or shortness of breath.    [name] aspirin EC 325 MG EC tablet Take 1 tablet (325 mg total) by mouth daily. 30 tablet 0  . atorvastatin (LIPITOR) 40 MG tablet Take 1 tablet (40 mg total) by mouth daily at 6 PM. 30 tablet 2  . carvedilol (COREG) 6.25 MG tablet Take 1 tablet (6.25 mg total) by mouth 2 (two) times daily with a meal. 60 tablet 3  . clopidogrel (PLAVIX) 75 MG tablet Take 1 tablet (75 mg total) by mouth daily. 30 tablet 2  . furosemide (LASIX) 40 MG tablet Take 1 tablet (40 mg total) by mouth 2 (two) times daily. 60 tablet 2  . lisinopril (PRINIVIL,ZESTRIL) 5 MG tablet   2  . metoprolol tartrate (LOPRESSOR) 25 MG tablet     . mometasone-formoterol (DULERA) 200-5 MCG/ACT AERO Inhale 2 puffs into the lungs 2 (two) times daily. 13 g 3  . omeprazole (PRILOSEC) 20 MG capsule Take 1 capsule (20 mg total) by mouth daily. 30 capsule 11  . tiotropium (SPIRIVA) 18 MCG inhalation capsule Place 1 capsule (18 mcg total) into inhaler and inhale daily. 30 capsule 3  . tiZANidine (ZANAFLEX) 4 MG tablet Take 1 tablet (4 mg total) by mouth 3 (three) times daily. 90 tablet 1  . traMADol (ULTRAM) 50 MG tablet Take 1 tablet (50 mg total) by mouth every 8 (eight) hours as needed. 60 tablet 2  . Pyridoxine HCl (VITAMIN B-6 PO) Take 1 tablet by mouth daily.    . vitamin B-12 (CYANOCOBALAMIN) 100 MCG tablet Take 100 mcg by mouth daily.     No current facility-administered medications for this visit.   REVIEW OF SYSTEMS:  '[X]'$  denotes positive finding, '[ ]'$  denotes negative finding Cardiac  Comments:  Chest pain or chest pressure:    Shortness of breath upon exertion:    Short of breath when lying flat:    Irregular heart rhythm:        Vascular    Pain in calf, thigh, or hip brought on by ambulation: X   Pain in feet at night that wakes you up from your sleep:     Blood clot in your veins: X History of chronic DVT of  right lower extremity.  Leg swelling:  X       Pulmonary    Oxygen at home:    Productive cough:     Wheezing:         Neurologic    Sudden weakness in arms or legs:     Sudden numbness in arms or legs:     Sudden onset of difficulty speaking or slurred speech:  Temporary loss of vision in one eye:     Problems with dizziness:         Gastrointestinal    Blood in stool:     Vomited blood:         Genitourinary    Burning when urinating:     Blood in urine:        Psychiatric    Major depression:         Hematologic    Bleeding problems:    Problems with blood clotting too easily:        Skin    Rashes or ulcers:        Constitutional    Fever or chills:      PHYSICAL EXAM: Filed Vitals:   11/21/14 1052  BP: 134/72  Pulse: 70  Temp: 97.6 F (36.4 C)  Resp: 20  Height: 6' (1.829 m)  Weight: 248 lb (112.492 kg)  SpO2: 96%   GENERAL: The patient is a well-nourished male, in no acute distress. The vital signs are documented above. CARDIAC: There is a regular rate and rhythm.  VASCULAR: I do not detect carotid bruits. He has palpable femoral pulses. I cannot palpate popliteal or pedal pulses. PULMONARY: There is good air exchange bilaterally without wheezing or rales. ABDOMEN: Soft and non-tender with normal pitched bowel sounds.  MUSCULOSKELETAL: There are no major deformities or cyanosis. NEUROLOGIC: No focal weakness or paresthesias are detected. SKIN: There are no ulcers or rashes noted. PSYCHIATRIC: The patient has a normal affect.  DATA:  ARTERIAL DOPPLER: Cammie Mcgee interpreted as arterial Doppler study today which shows monophasic Doppler signals in the right posterior tibial and dorsalis pedis positions with an ABI of 32%. He has biphasic Doppler signals in the left foot and the dorsalis pedis and posterior tibial positions with an ABI of 65%.  I have reviewed his venous duplex scan from 08/29/2014 which shows no evidence of DVT involving the right  lower extremity. There was no evidence of DVT involving the left lower extremity also.  MEDICAL ISSUES:  RIGHT INFRAINGUINAL ARTERIAL OCCLUSIVE DISEASE: This patient has an occluded right femoropopliteal bypass graft was done with PTFE as he had no vein available. He has a patent aortobifemoral bypass graft. He has progressive ischemia of the right lower extremity with rest pain. I have recommended arteriography to evaluate his options. I have reviewed with the patient the indications for arteriography. In addition, I have reviewed the potential complications of arteriography including but not limited to: Bleeding, arterial injury, arterial thrombosis, dye action, renal insufficiency, or other unpredictable medical problems. I have explained to the patient that if we find disease amenable to angioplasty we could potentially address this at the same time. I have discussed the potential complications of angioplasty and stenting, including but not limited to: Bleeding, arterial thrombosis, arterial injury, dissection, or the need for surgical intervention. We have had a long discussion, greater than 3 minutes, about the importance of tobacco cessation given that he is at significant risk for limb loss on the right. I will make further recommendations pending the results of his arteriogram.   Deitra Mayo [facility] and Vein Specialists of [facility]: [phone]

## 2014-12-10 NOTE — Op Note (Signed)
PATIENT: [name]  MRN: [id number] DOB: [birth year]    DATE OF PROCEDURE: 12/10/2014  INDICATIONS: [name] is a 64 y.o. male he was referred with bilateral lower extremity swelling and leg pain. He has a complicated history. He has ongoing tobacco use. He set up previous DVT in the right lower extremity. He underwent an aortobifemoral bypass graft in the late 1990s. He was then lost to follow up. He had a right femoral to above-knee popliteal artery bypass with PTFE in July 2012. He had a left femoropopliteal bypass graft done elsewhere. Both femoropopliteal grafts are chronically occluded. He had smoked 2 packs per day of cigarettes for many years but is now cut back to 1 pack per day. He has acute on chronic congestive heart failure. He has a history of coronary artery disease and also COPD. Arterial Doppler study showed an ABI of 32% right 65% on the left.  PROCEDURE:  1. Ultrasound-guided access to the right limb of his aortobifemoral bypass graft 2. Aortogram with bilateral iliac arteriogram and right lower extremity runoff 3. Selective catheterization of the left limb of his aortofemoral bypass graft with left lower extremity runoff  SURGEON: Judeth Cornfield. Scot Dock, MD, FACS  ANESTHESIA: local with sedation   EBL: minimal  TECHNIQUE: The patient was taken to the peripheral vascular lab and received 1 mg of Versed and 50 g of fentanyl. Both groins were prepped and draped in the usual sterile fashion. Under ultrasound guidance, after the skin was anesthetized, the right limb of the aortobifemoral bypass graft was cannulated with a micropuncture needle. A micropuncture wire was introduced but the sheath would not advance. I therefore held pressure. Under ultrasound guidance, the right limb of the aortobifemoral bypass graft was cannulated with an 18-gauge needle and a Rosen wire introduced into the right limb of the aortofemoral bypass graft. A 5 French sheath was introduced  over the wire. A pigtail catheter was positioned at the L1 vertebral body and flush aortogram obtained. The catheter was in position above the aortic bifurcation and bilateral lower extremity runoff films were obtained. However the catheter pulled down some and therefore there was poor visualization on the left side. I therefore exchanged the pigtail catheter for a Shepherds hook catheter which was positioned into the left limb of the aortofemoral bypass graft. Selective arteriograms obtained through the left limb of the graft with left lower extremity runoff.  FINDINGS:  1. There are single renal arteries bilaterally with no significant renal artery stenosis identified. 2. The aortobifemoral bypass graft is patent 3. On the right side, there is diffuse disease of the common femoral artery below the right limb of the aortofemoral bypass graft. The superficial femoral artery is occluded at the origin with reconstitution of the below-knee pop to artery. The deep femoral artery has diffuse disease with a tight stenosis proximally. There is diffuse disease distally with 2 vessel runoff via the posterior tibial arteries on the right. There is an AV fistula in the right side although the site of origin cannot be identified. 4. On the left side, the common femoral and deep femoral artery air patent. The superficial femoral arteries occluded at its origin. There is reconstitution of the popliteal artery at the level of the knee. There is two-vessel runoff on the left via the posterior tibial and arteries.  CLINICAL NOTE: He has had multiple previous bypasses but he has severe ischemia of the right leg with an ABI of 32% and multilevel disease. I think  he could be considered for a right femoral to below knee popliteal artery bypass with PTFE given that he has no vein available. However given his history he will need preoperative cardiac clearance. He could potentially have a stage left femoral to below-knee pop  bypass also with PTFE. Certainly his increased risks because of his continued tobacco use, obesity, COPD, and cardiac history. I'll see him back in the office after his cardiac eval and we can discuss surgery further.  Deitra Mayo, MD, FACS [facility] and Vein Specialists of [facility]  DATE OF DICTATION:   12/10/2014

## 2014-12-10 NOTE — Discharge Instructions (Signed)
Angiogram, Care After °Refer to this sheet in the next few weeks. These instructions provide you with information about caring for yourself after your procedure. Your health care provider may also give you more specific instructions. Your treatment has been planned according to current medical practices, but problems sometimes occur. Call your health care provider if you have any problems or questions after your procedure. °WHAT TO EXPECT AFTER THE PROCEDURE °After your procedure, it is typical to have the following: °· Bruising at the catheter insertion site that usually fades within 1-2 weeks. °· Blood collecting in the tissue (hematoma) that may be painful to the touch. It should usually decrease in size and tenderness within 1-2 weeks. °HOME CARE INSTRUCTIONS °· Take medicines only as directed by your health care provider. °· You may shower 24-48 hours after the procedure or as directed by your health care provider. Remove the bandage (dressing) and gently wash the site with plain soap and water. Pat the area dry with a clean towel. Do not rub the site, because this may cause bleeding. °· Do not take baths, swim, or use a hot tub until your health care provider approves. °· Check your insertion site every day for redness, swelling, or drainage. °· Do not apply powder or lotion to the site. °· Do not lift over 10 lb (4.5 kg) for 5 days after your procedure or as directed by your health care provider. °· Ask your health care provider when it is okay to: °¨ Return to work or school. °¨ Resume usual physical activities or sports. °¨ Resume sexual activity. °· Do not drive home if you are discharged the same day as the procedure. Have someone else drive you. °· You may drive 24 hours after the procedure unless otherwise instructed by your health care provider. °· Do not operate machinery or power tools for 24 hours after the procedure or as directed by your health care provider. °· If your procedure was done as an  outpatient procedure, which means that you went home the same day as your procedure, a responsible adult should be with you for the first 24 hours after you arrive home. °· Keep all follow-up visits as directed by your health care provider. This is important. °SEEK MEDICAL CARE IF: °· You have a fever. °· You have chills. °· You have increased bleeding from the catheter insertion site. Hold pressure on the site. °SEEK IMMEDIATE MEDICAL CARE IF: °· You have unusual pain at the catheter insertion site. °· You have redness, warmth, or swelling at the catheter insertion site. °· You have drainage (other than a small amount of blood on the dressing) from the catheter insertion site. °· The catheter insertion site is bleeding, and the bleeding does not stop after 30 minutes of holding steady pressure on the site. °· The area near or just beyond the catheter insertion site becomes pale, cool, tingly, or numb. °  °This information is not intended to replace advice given to you by your health care provider. Make sure you discuss any questions you have with your health care provider. °  °Document Released: 08/14/2004 Document Revised: 02/16/2014 Document Reviewed: 06/29/2012 °Elsevier Interactive Patient Education ©2016 [employer] ° °

## 2014-12-11 ENCOUNTER — Telehealth: Payer: Self-pay | Admitting: Family Medicine

## 2014-12-11 ENCOUNTER — Encounter (HOSPITAL_COMMUNITY): Payer: Self-pay | Admitting: Vascular Surgery

## 2014-12-11 ENCOUNTER — Telehealth: Payer: Self-pay | Admitting: Vascular Surgery

## 2014-12-11 NOTE — Telephone Encounter (Signed)
Pt. Care taker called requesting a med refill on Tramadol. The pt. receives home care pharmacy and needs the prescription sent.  Phone- [phone]  [phone]

## 2014-12-11 NOTE — Telephone Encounter (Signed)
-----   Message from Angelia Mould, MD sent at 12/10/2014 11:48 AM EDT ----- Regarding: f/u I am considering this patient for a right femoropopliteal bypass however the patient does not want to do it until the first of the year. They have been cleared from a cardiac standpoint with a recent stress test. I need to see him in approximately a month to discuss his surgery. Thank you. CD

## 2014-12-11 NOTE — Telephone Encounter (Signed)
Spoke with pt, dpm °

## 2014-12-17 ENCOUNTER — Ambulatory Visit: Payer: Self-pay | Admitting: Family Medicine

## 2014-12-26 NOTE — Telephone Encounter (Signed)
Patients caretaker called and requested all of the patients medications to be refilled and faxed to [facility] Fax # [phone].   Please f/u with caretaker for any questions.

## 2014-12-28 ENCOUNTER — Other Ambulatory Visit: Payer: Self-pay

## 2014-12-28 MED ORDER — TRAMADOL HCL 50 MG PO TABS: 50.0000 mg | ORAL_TABLET | Freq: Three times a day (TID) | ORAL | Status: DC | PRN

## 2014-12-28 NOTE — Telephone Encounter (Signed)
CMA called patient, patient verified name and DOB. Pt was stated that his caregiver picked up his meds today. I was unclear as to what he was saying due to the fact that the reason for my call was to get an understanding of the message left about mail order rx's. Called patient caregiver and she explained that he does need all meds starting soon to be faxed over to [facility]. Currently patient is good for now with his meds. I explained to the caregiver that patient needs to make an appointment with [place] to establish care for future OV and medication refills. She stated that she understood.

## 2015-01-01 ENCOUNTER — Telehealth: Payer: Self-pay

## 2015-01-01 NOTE — Telephone Encounter (Signed)
CMA called patient, patient verified name and DOB. I was following up with our previous conversation about patient making an appt to establish care with a Provider for future Rx's and OV. Patient put his caregiver [name]) on the phone, so I transferred her to the front to make an appointment for [name]. Next available slot is not until end of December. [name] informed the patient about the availability schedule.

## 2015-01-12 IMAGING — CT CT HEAD W/O CM
2 series · 16 of 30 positions shown, 18 images · non-contrast
Comparison: 12/22/2012.

CLINICAL DATA: Headache.  Dizziness.  Shortness breath.

EXAM:
CT HEAD WITHOUT CONTRAST
TECHNIQUE: Contiguous axial images were obtained from the base of the skull
through the vertex without intravenous contrast.

[Series 2: head w/o · axial · non-contrast · 0.49mm/px · z∈[+150,+270]mm · 8 of 32 slices shown, 10 images]
[im 4/32  brain]
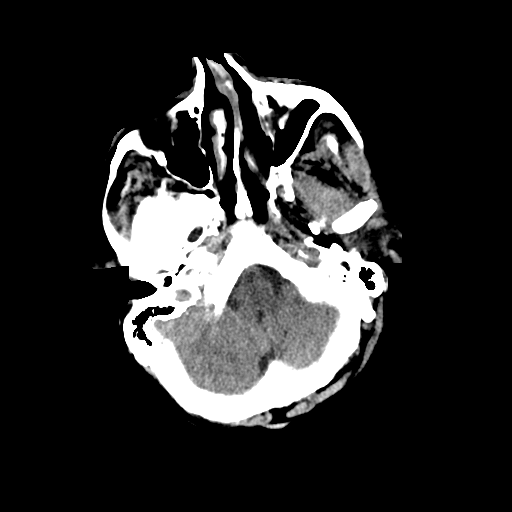
[im 4/32  bone]
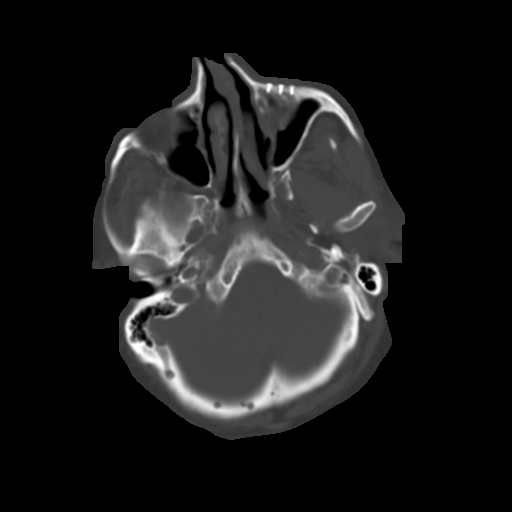
[im 7/32  brain]
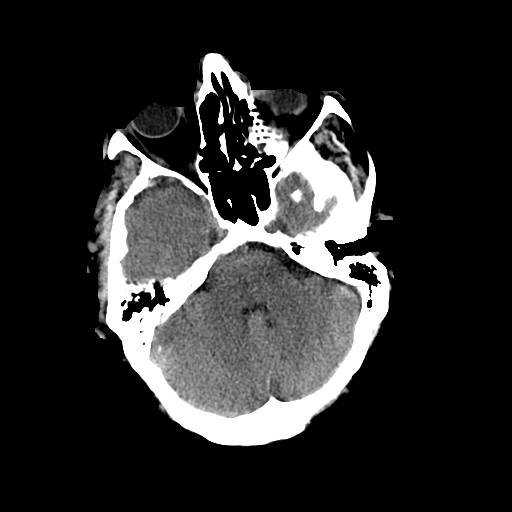
[im 11/32  brain]
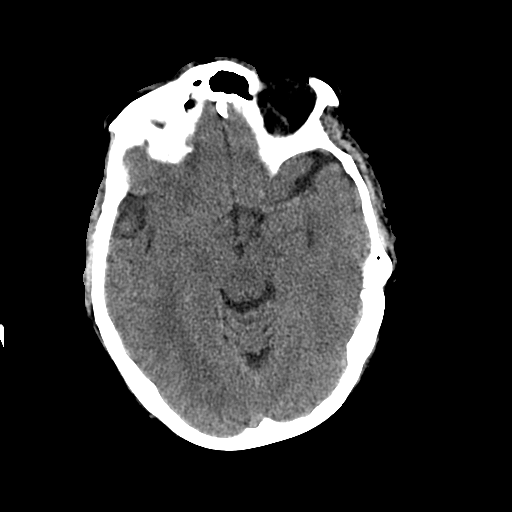
[im 14/32  brain]
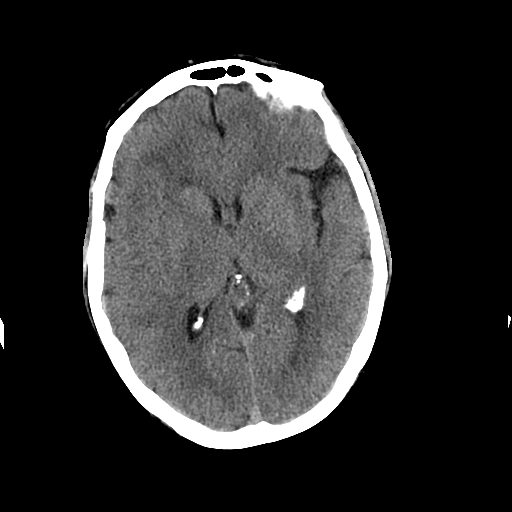
[im 18/32  brain]
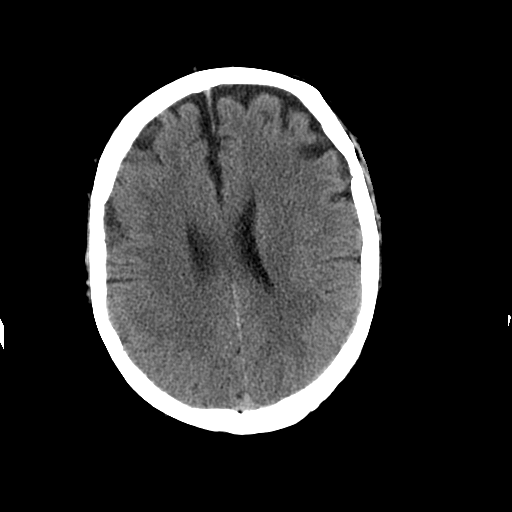
[im 18/32  bone]
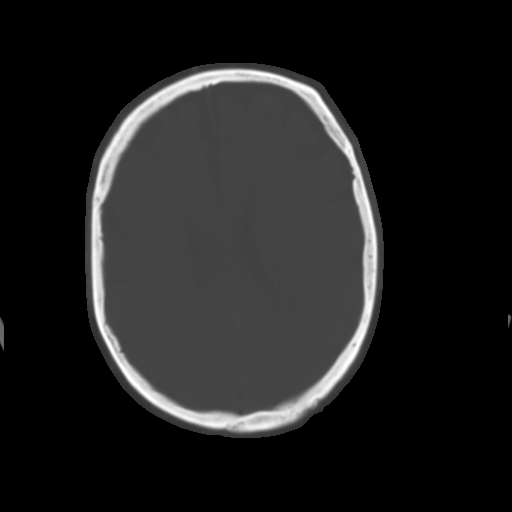
[im 21/32  brain]
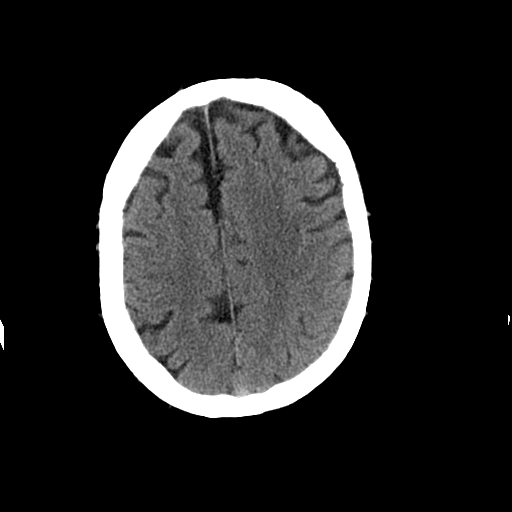
[im 25/32  brain]
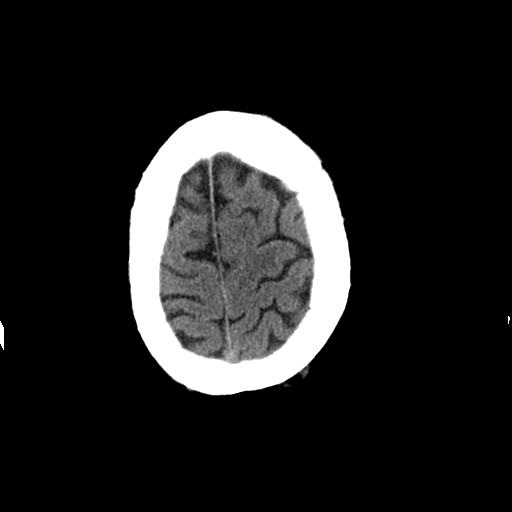
[im 28/32  brain]
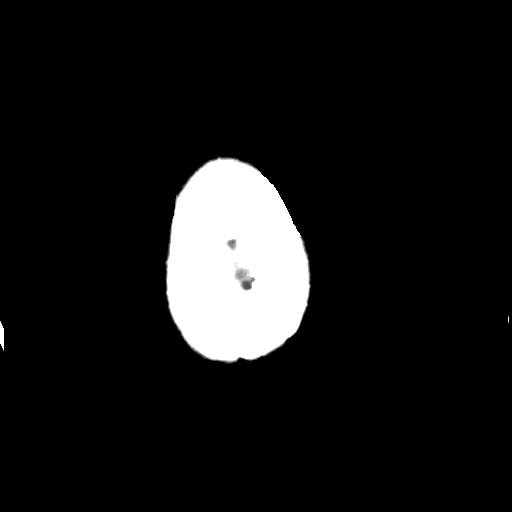

[Series 3: head w/o bone · axial · non-contrast · 0.49mm/px · z∈[+150,+272]mm · 8 of 63 slices shown]
[im 7/63  bone]
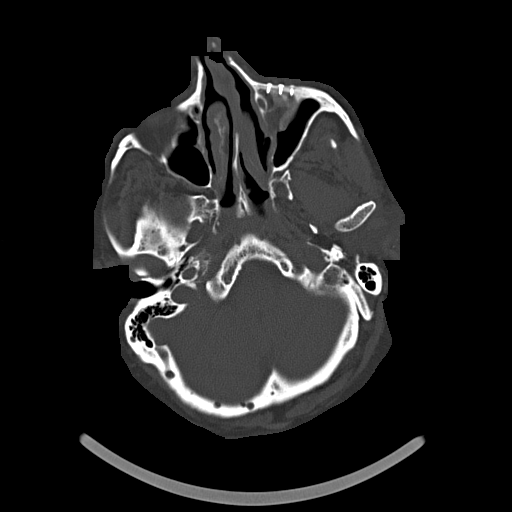
[im 14/63  bone]
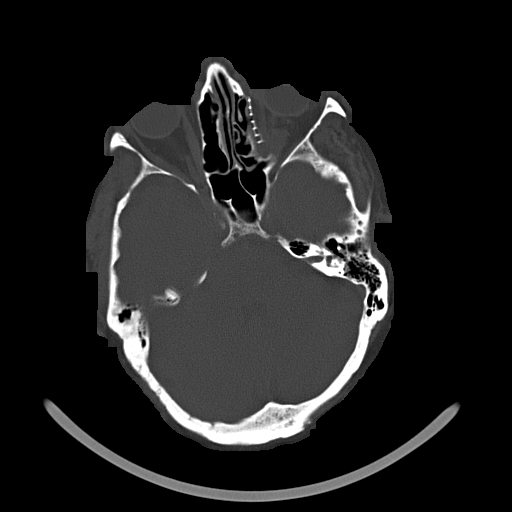
[im 20/63  bone]
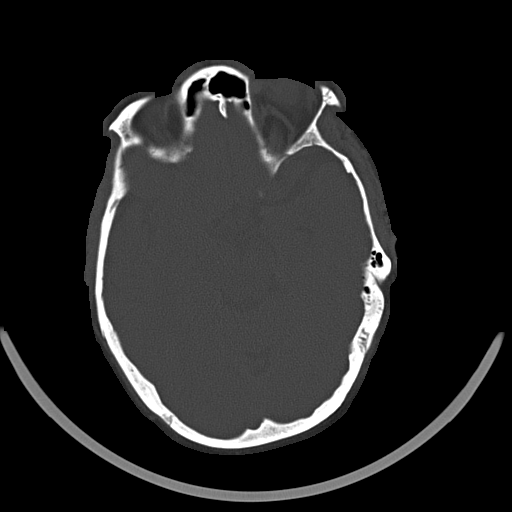
[im 27/63  bone]
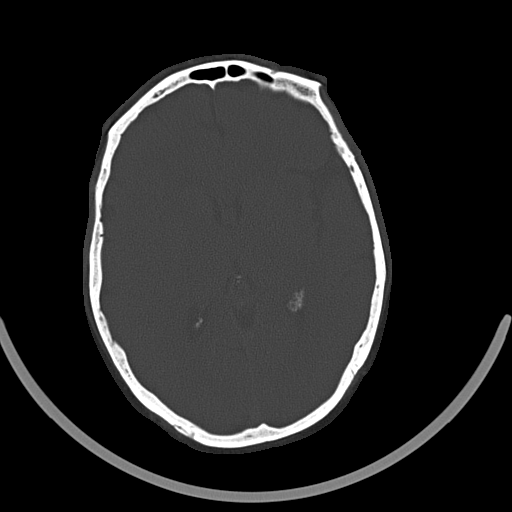
[im 36/63  bone]
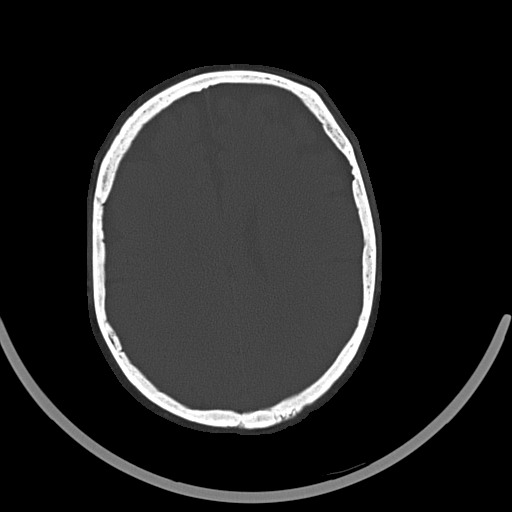
[im 43/63  bone]
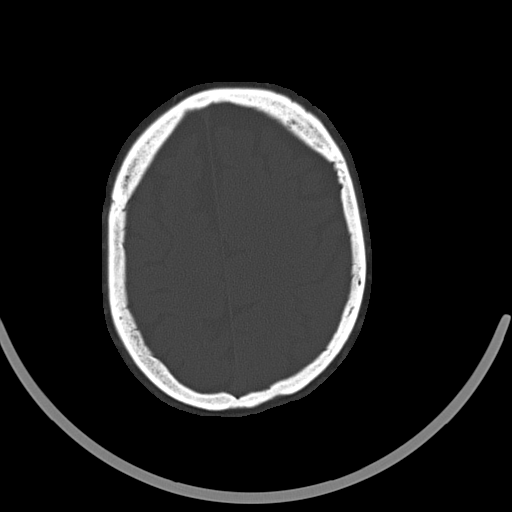
[im 49/63  bone]
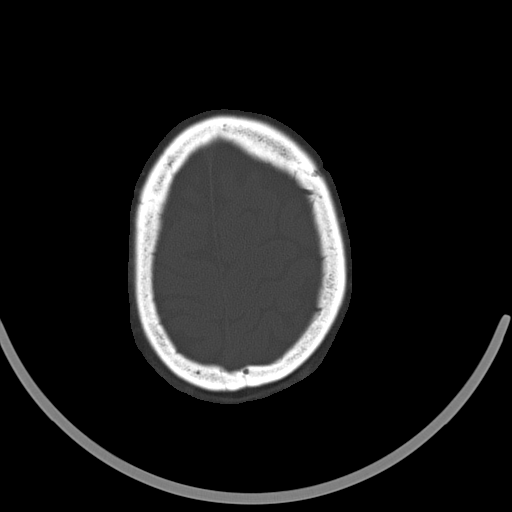
[im 56/63  bone]
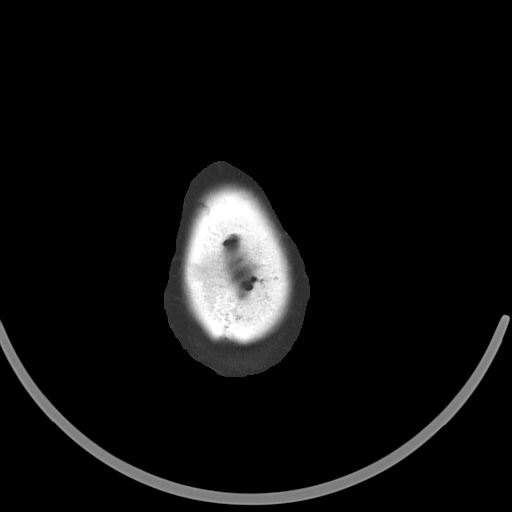

[16 of 30 positions shown; findings below may reference images not displayed]

FINDINGS: No intracranial hemorrhage.

No CT evidence of large acute infarct. If posterior fossa infarct is
of high clinical concern, MR imaging may be considered for further
delineation.

No hydrocephalus.

No intracranial mass lesion noted on this unenhanced exam.

Prior left orbital surgery with mesh in place.
IMPRESSION: No intracranial hemorrhage or CT evidence of large acute infarct.

Please see above.

## 2015-01-13 IMAGING — CR DG RIBS 2V*R*
2 series · 2 of 2 positions shown · non-contrast
Comparison: DG CHEST 2 VIEW dated 04/09/2013

CLINICAL DATA: R rib pain

EXAM:
RIGHT RIBS - 2 VIEW

[w ribs ap upper right]
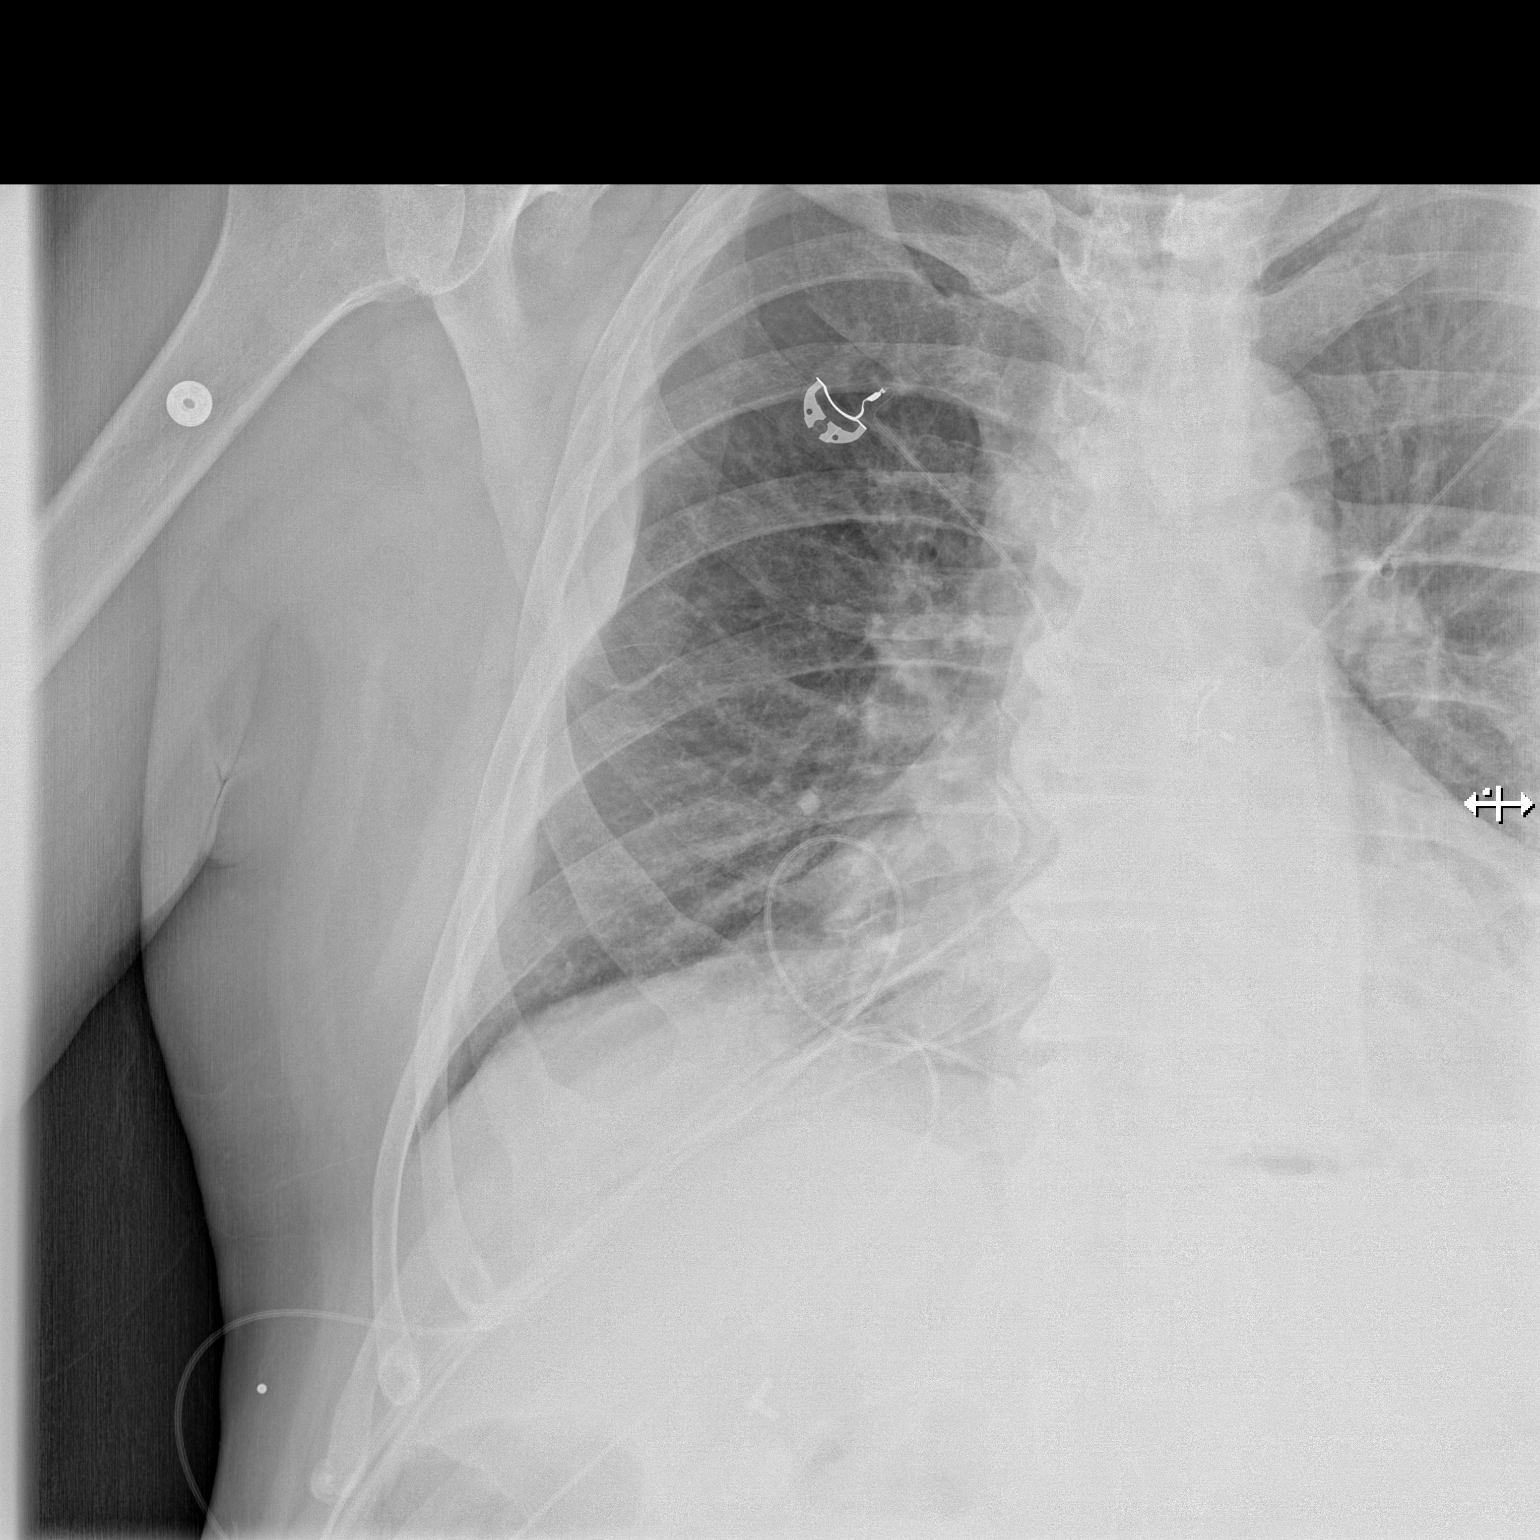

[w ribs ap lower right]
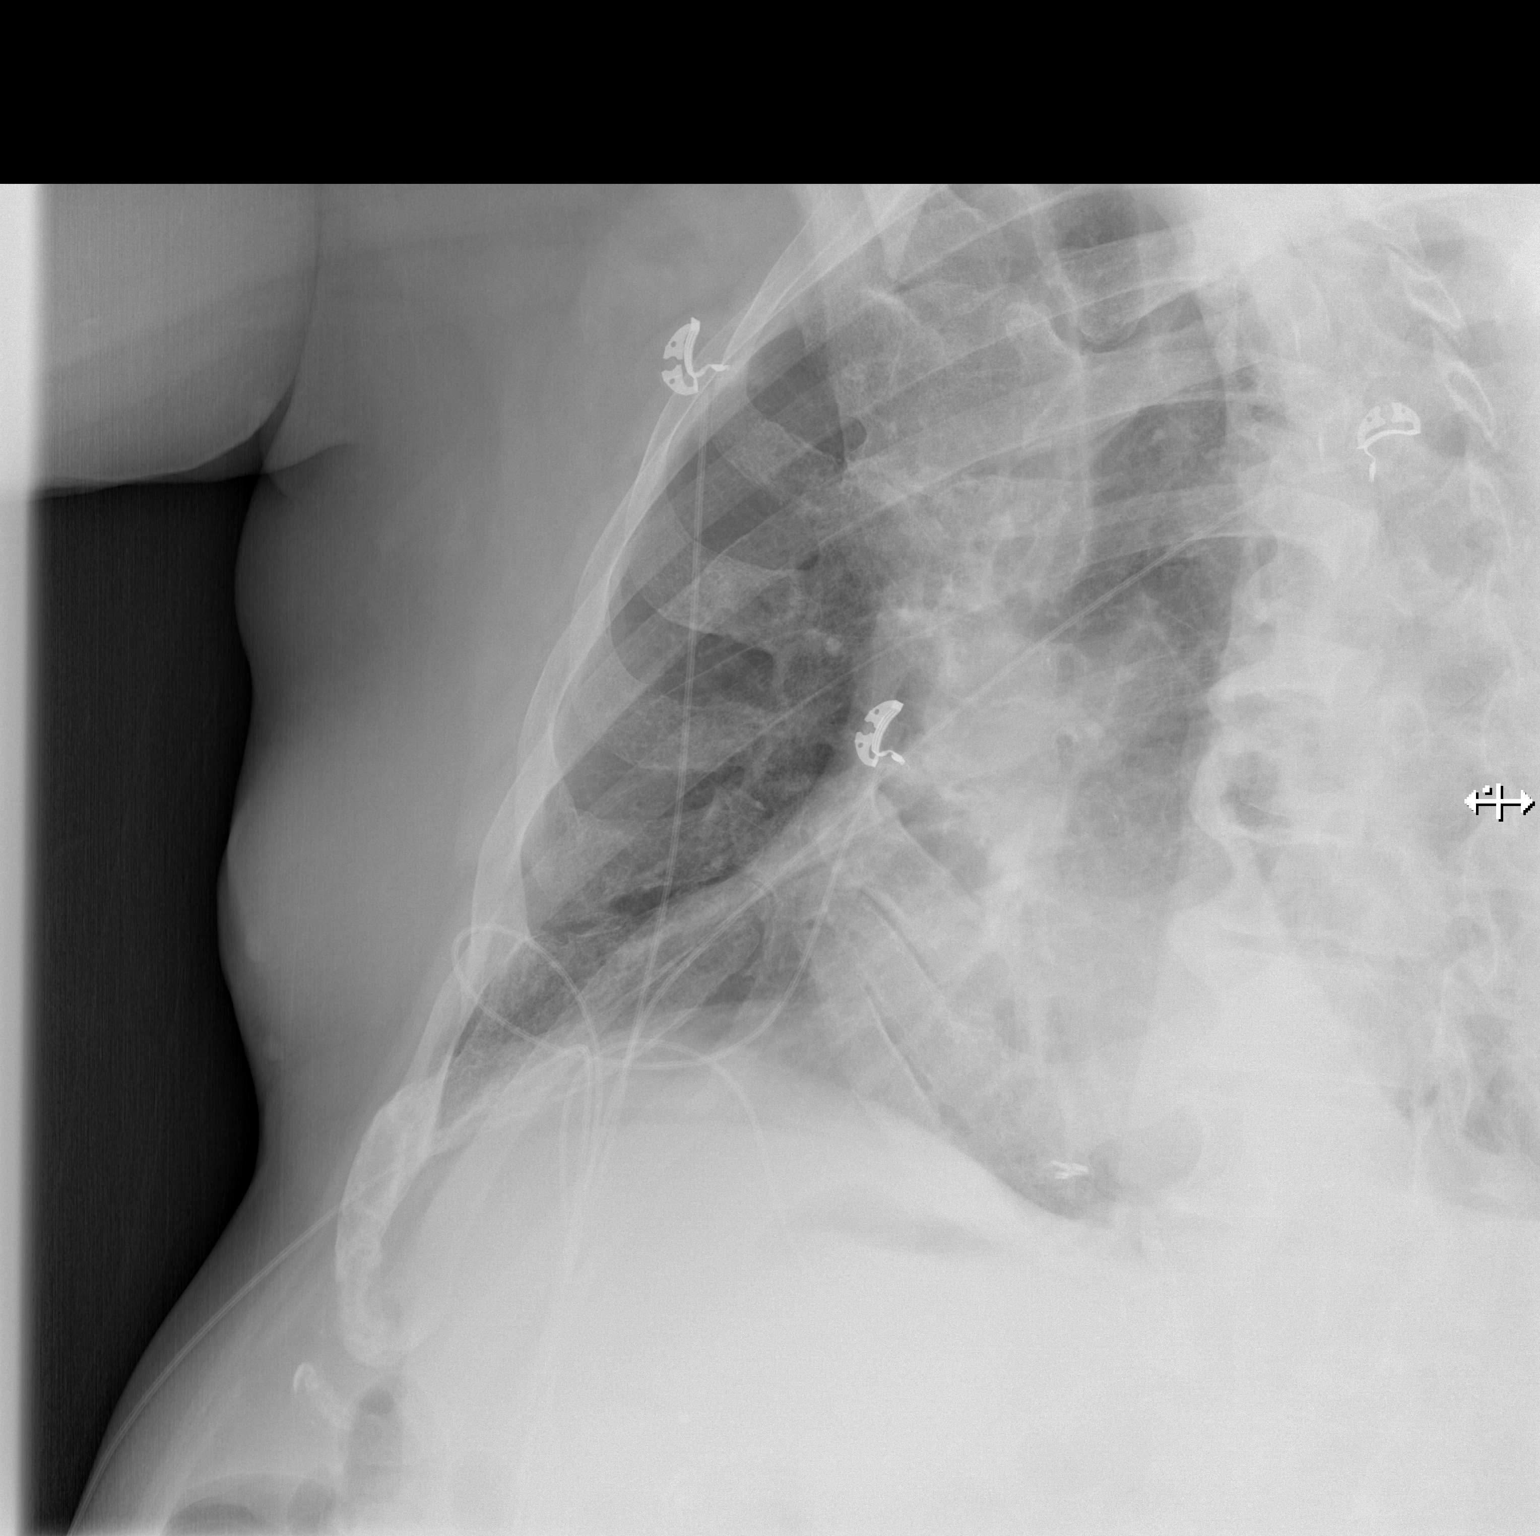

[2 of 2 positions shown; findings below may reference images not displayed]

FINDINGS: No fracture or other bone lesions are seen involving the ribs.
IMPRESSION: Negative.

## 2015-01-16 ENCOUNTER — Ambulatory Visit: Payer: Self-pay | Admitting: Vascular Surgery

## 2015-01-16 ENCOUNTER — Ambulatory Visit: Payer: Medicare Other | Admitting: Cardiology

## 2015-01-16 ENCOUNTER — Other Ambulatory Visit: Payer: Self-pay

## 2015-01-16 DIAGNOSIS — I639 Cerebral infarction, unspecified: Secondary | ICD-10-CM

## 2015-01-16 MED ORDER — ALBUTEROL SULFATE HFA 108 (90 BASE) MCG/ACT IN AERS: 2.0000 | INHALATION_SPRAY | Freq: Four times a day (QID) | RESPIRATORY_TRACT | Status: DC | PRN

## 2015-01-16 MED ORDER — OMEPRAZOLE 20 MG PO CPDR: 20.0000 mg | DELAYED_RELEASE_CAPSULE | Freq: Every day | ORAL | Status: DC

## 2015-01-16 MED ORDER — ALBUTEROL SULFATE (2.5 MG/3ML) 0.083% IN NEBU: 2.5000 mg | INHALATION_SOLUTION | Freq: Two times a day (BID) | RESPIRATORY_TRACT | Status: DC | PRN

## 2015-01-16 MED ORDER — FUROSEMIDE 40 MG PO TABS: 40.0000 mg | ORAL_TABLET | Freq: Two times a day (BID) | ORAL | Status: DC

## 2015-01-16 MED ORDER — MOMETASONE FURO-FORMOTEROL FUM 200-5 MCG/ACT IN AERO: 2.0000 | INHALATION_SPRAY | Freq: Two times a day (BID) | RESPIRATORY_TRACT | Status: DC

## 2015-01-16 MED ORDER — CLOPIDOGREL BISULFATE 75 MG PO TABS: 75.0000 mg | ORAL_TABLET | Freq: Every day | ORAL | Status: DC

## 2015-01-16 MED ORDER — ATORVASTATIN CALCIUM 40 MG PO TABS: 40.0000 mg | ORAL_TABLET | Freq: Every day | ORAL | Status: DC

## 2015-01-16 MED ORDER — TIZANIDINE HCL 4 MG PO TABS: 4.0000 mg | ORAL_TABLET | Freq: Three times a day (TID) | ORAL | Status: DC

## 2015-01-16 MED ORDER — CARVEDILOL 6.25 MG PO TABS: 6.2500 mg | ORAL_TABLET | Freq: Two times a day (BID) | ORAL | Status: DC

## 2015-01-16 MED ORDER — METOPROLOL TARTRATE 25 MG PO TABS: 25.0000 mg | ORAL_TABLET | Freq: Two times a day (BID) | ORAL | Status: DC

## 2015-01-16 MED ORDER — TIOTROPIUM BROMIDE MONOHYDRATE 18 MCG IN CAPS: 18.0000 ug | ORAL_CAPSULE | Freq: Every day | RESPIRATORY_TRACT | Status: DC

## 2015-01-16 MED ORDER — ASPIRIN 325 MG PO TBEC: 325.0000 mg | DELAYED_RELEASE_TABLET | Freq: Every day | ORAL | Status: DC

## 2015-01-17 ENCOUNTER — Telehealth: Payer: Self-pay

## 2015-01-17 ENCOUNTER — Encounter: Payer: Self-pay | Admitting: Vascular Surgery

## 2015-01-17 NOTE — Telephone Encounter (Signed)
Patient was requesting all medication be faxed over to [facility]. Per Dr. [name], all medication, except patients eye drop were faxed to [facility] mail order [facility]. Patient will be notified.

## 2015-01-18 NOTE — Telephone Encounter (Signed)
CMA called patient, patient did not answer. Message was left for the patient to call me back on his VM.

## 2015-01-23 ENCOUNTER — Encounter: Payer: Self-pay | Admitting: Vascular Surgery

## 2015-01-23 ENCOUNTER — Ambulatory Visit: Payer: Self-pay | Admitting: Family Medicine

## 2015-01-23 ENCOUNTER — Ambulatory Visit (INDEPENDENT_AMBULATORY_CARE_PROVIDER_SITE_OTHER): Payer: Medicare Other | Admitting: Vascular Surgery

## 2015-01-23 VITALS — BP 136/65 | HR 66 | Temp 97.7°F | Ht 72.0 in | Wt 245.6 lb

## 2015-01-23 DIAGNOSIS — I70221 Atherosclerosis of native arteries of extremities with rest pain, right leg: Secondary | ICD-10-CM | POA: Diagnosis not present

## 2015-01-23 NOTE — Progress Notes (Signed)
Vascular and Vein Specialist of [facility]  Patient name: [name] MRN: [id number] DOB: [birth year] Sex: male  REASON FOR VISIT: Follow up of peripheral vascular disease  HPI: [name] is a 64 y.o. male who had been referred with bilateral lower extremity swelling and leg pain. He's had a previous DVT in the right lower extremity. He underwent aortobifemoral bypass grafting in the late 1990s. He had a right femoral to above-knee popliteal artery bypass with a prosthetic graft in 2012. He had a left femoral popliteal bypass graft done elsewhere. Both of his femoropopliteal bypass grafts are chronically occluded. He has a heavy history of tobacco use. Given his progressive symptoms he was set up for an arteriogram which was performed on 12/10/2014. At the time of his last visit ABI on the right was 32%.   His arteriogram showed that his aortobifemoral bypass graft was patent.  On the right side, there was diffuse disease of the common femoral artery below the right limb of his aortofemoral bypass graft. The superficial femoral artery was occluded at its origin with reconstitution of the below knee popliteal artery. The deep femoral artery had diffuse disease with a tight stenosis proximally. There was two-vessel runoff on the right.  On the left side, the common femoral and deep femoral arteries were patent. The superficial femoral artery was occluded at its origin with reconstitution of the popliteal artery at the level of the knee. There was two-vessel runoff on the left.  We were considering a redo bypass graft on the right although he has multiple medical comorbidities including continued tobacco use, obesity, COPD, and a significant cardiac history. He did undergo a stress test and has been cleared for surgery. Stress test on 12/04/2014 was negative for ischemia and he was cleared for surgery.  He continues to have rest pain in the right foot which she can no longer tolerate.  He therefore wishes to proceed with redo bypass on the right.   Past Medical History  Diagnosis Date  . Depression   . Hypertension   . Hyperlipidemia   . Myocardial infarction ([place]) 2000  . CHF (congestive heart failure) ([place])   . COPD (chronic obstructive pulmonary disease) ([place])   . Peripheral vascular disease, unspecified ([place])     08/20/10 doppler: increase in right ABI post-op. Left ABI stable. S/P bi-fem bypass surgery  . GERD (gastroesophageal reflux disease)   . Asthma   . Anxiety   . History of DVT of lower extremity   . CAD (coronary artery disease) 05/27/10    Cath: severe single vessell CAD left cx midportion obtuse marginal 2 to 3.  . Shortness of breath   . Pneumonia 04/04/2012  . Arthritis   . OSA on CPAP   . Hyperlipemia   . COPD (chronic obstructive pulmonary disease) ([place])   . Alcohol abuse     H/O  . Tobacco abuse   . Orbital fracture ([place]) 12/2012  . Stroke [facility])     Family History  Problem Relation Age of Onset  . Heart attack Mother   . Cirrhosis Father   . Heart failure Mother   . Heart failure Brother   . Cancer Brother   . Hypertension Neg Hx     UNKNOWN  . Stroke Neg Hx     UNKNOWN    SOCIAL HISTORY: Social History  Substance Use Topics  . Smoking status: Current Every Day Smoker -- 1.00 packs/day for 50 years    Types: Cigars,  Cigarettes    Start date: 02/09/1961  . Smokeless tobacco: Former Systems developer    Types: Chew     Comment: 2 ppd, full flavor  . Alcohol Use: 0.6 oz/week    1 Cans of beer per week     Comment: 10/24/14 no drinking for 2 weeks    Allergies  Allergen Reactions  . Darvocet [Propoxyphene N-Acetaminophen] Hives  . Haldol [Haloperidol Decanoate] Hives  . Acetaminophen Nausea Only    Upset stomach, tolerates Hydrocodone/APAP if taken with food Upset stomach, tolerates Hydrocodone/APAP if taken with food  . Norco [Hydrocodone-Acetaminophen] Nausea And Vomiting    Tolerates if taken with food    Current Outpatient  Prescriptions  Medication Sig Dispense Refill  . albuterol (PROVENTIL HFA;VENTOLIN HFA) 108 (90 BASE) MCG/ACT inhaler Inhale 2 puffs into the lungs every 6 (six) hours as needed for wheezing or shortness of breath. 1 Inhaler 3  . albuterol (PROVENTIL) (2.5 MG/3ML) 0.083% nebulizer solution Take 3 mLs (2.5 mg total) by nebulization 2 (two) times daily as needed for wheezing or shortness of breath. 75 mL 0  . aspirin 325 MG EC tablet Take 1 tablet (325 mg total) by mouth daily. 30 tablet 0  . atorvastatin (LIPITOR) 40 MG tablet Take 1 tablet (40 mg total) by mouth daily at 6 PM. 30 tablet 2  . Besifloxacin HCl (BESIVANCE) 0.6 % SUSP Place 1 drop into the left eye 2 (two) times daily.    . carvedilol (COREG) 6.25 MG tablet Take 1 tablet (6.25 mg total) by mouth 2 (two) times daily with a meal. 60 tablet 3  . clopidogrel (PLAVIX) 75 MG tablet Take 1 tablet (75 mg total) by mouth daily. 30 tablet 2  . Difluprednate (DUREZOL) 0.05 % EMUL Place 1 drop into the left eye 2 (two) times daily.    . furosemide (LASIX) 40 MG tablet Take 1 tablet (40 mg total) by mouth 2 (two) times daily. 60 tablet 2  . metoprolol tartrate (LOPRESSOR) 25 MG tablet Take 1 tablet (25 mg total) by mouth 2 (two) times daily. 60 tablet 0  . mometasone-formoterol (DULERA) 200-5 MCG/ACT AERO Inhale 2 puffs into the lungs 2 (two) times daily. 13 g 3  . omeprazole (PRILOSEC) 20 MG capsule Take 1 capsule (20 mg total) by mouth daily. 30 capsule 0  . tiotropium (SPIRIVA) 18 MCG inhalation capsule Place 1 capsule (18 mcg total) into inhaler and inhale daily. 30 capsule 3  . tiZANidine (ZANAFLEX) 4 MG tablet Take 1 tablet (4 mg total) by mouth 3 (three) times daily. 90 tablet 1  . traMADol (ULTRAM) 50 MG tablet Take 1 tablet (50 mg total) by mouth every 8 (eight) hours as needed. 30 tablet 0   No current facility-administered medications for this visit.    REVIEW OF SYSTEMS:  '[X]'$  denotes positive finding, '[ ]'$  denotes negative  finding Cardiac  Comments:  Chest pain or chest pressure:    Shortness of breath upon exertion: X   Short of breath when lying flat:    Irregular heart rhythm:        Vascular    Pain in calf, thigh, or hip brought on by ambulation: X   Pain in feet at night that wakes you up from your sleep:  X Right foot  Blood clot in your veins:    Leg swelling:  X chronic      Pulmonary    Oxygen at home:    Productive cough:     Wheezing:  Neurologic    Sudden weakness in arms or legs:     Sudden numbness in arms or legs:     Sudden onset of difficulty speaking or slurred speech:    Temporary loss of vision in one eye:     Problems with dizziness:         Gastrointestinal    Blood in stool:     Vomited blood:         Genitourinary    Burning when urinating:     Blood in urine:        Psychiatric    Major depression:         Hematologic    Bleeding problems:    Problems with blood clotting too easily:        Skin    Rashes or ulcers:        Constitutional    Fever or chills:      PHYSICAL EXAM: Filed Vitals:   01/23/15 0947  BP: 136/65  Pulse: 66  Temp: 97.7 F (36.5 C)  TempSrc: Oral  Height: 6' (1.829 m)  Weight: 245 lb 9.6 oz (111.403 kg)  SpO2: 99%    GENERAL: The patient is a well-nourished male, in no acute distress. The vital signs are documented above. CARDIAC: There is a regular rate and rhythm.  VASCULAR: he has palpable femoral pulses. I cannot palpate popliteal or pedal pulses. He has mild bilateral lower extremity swelling. PULMONARY: There is good air exchange bilaterally without wheezing or rales. ABDOMEN: Soft and non-tender with normal pitched bowel sounds.  MUSCULOSKELETAL: There are no major deformities or cyanosis. NEUROLOGIC: No focal weakness or paresthesias are detected. SKIN: There are no ulcers or rashes noted. PSYCHIATRIC: The patient has a normal affect.  DATA:  The results of his arteriogram are discussed above. ABI on the  right is 32%. He has critical limb ischemia on the right.  MEDICAL ISSUES:  MULTILEVEL ARTERIAL OCCLUSIVE DISEASE WITH REST PAIN RIGHT LOWER EXTREMITY:  Given that his symptoms have progressed on the right to the point of rest pain he has limb threatening ischemia. I think his best option for revascularization would be a redo right femoral to below knee popliteal artery bypass. I have reviewed the indications for lower extremity bypass. I have also reviewed the potential complications of surgery including but not limited to: wound healing problems, infection, graft thrombosis, limb loss, or other unpredictable medical problems. All the patient's questions were answered and they are agreeable to proceed. He would like to schedule this in early January and his surgery is scheduled for 02/19/2015. We will hold his Plavix preoperatively.  Deitra Mayo [facility] [place]: [phone]

## 2015-01-24 ENCOUNTER — Telehealth: Payer: Self-pay | Admitting: Family Medicine

## 2015-01-24 NOTE — Telephone Encounter (Signed)
HIPAA compliant voicemail message left for patient to return RN call at [phone].  Patient was prescribed tizanidine on 01/16/15 and has 1 refill.  He is supposed to be taking the medication 3 times a day and was given 90 tablets.  It is far too soon for the medication to be refilled.  He may refill 1 month after the original date filled and already has a refill so he needs to request a refill from his pharmacy.

## 2015-01-24 NOTE — Telephone Encounter (Signed)
[name] pt. PCA called requesting a med refill on tiZANidine (ZANAFLEX) 4 MG tablet. Pt. Is out of this medication. Please f/u with pt.  He can also be reached at [phone].

## 2015-01-25 ENCOUNTER — Other Ambulatory Visit: Payer: Self-pay

## 2015-01-25 ENCOUNTER — Telehealth: Payer: Self-pay | Admitting: *Deleted

## 2015-01-25 NOTE — Telephone Encounter (Signed)
[name],CNA for [name] called in to ask about refill for zanaflex.  [name] was in the background and gave this RN his name and date of birth.  RN explained that refill could not be given because it was too soon.  CNA reported that patient told her he had been taking the medication more than prescribed.  RN explained that that was dangerous and that patient should  Not continue with that.  CNA states she would talk to patient.

## 2015-01-29 ENCOUNTER — Telehealth: Payer: Self-pay | Admitting: Family Medicine

## 2015-01-29 DIAGNOSIS — I639 Cerebral infarction, unspecified: Secondary | ICD-10-CM

## 2015-01-29 MED ORDER — ALBUTEROL SULFATE HFA 108 (90 BASE) MCG/ACT IN AERS: 2.0000 | INHALATION_SPRAY | Freq: Four times a day (QID) | RESPIRATORY_TRACT | Status: DC | PRN

## 2015-01-29 MED ORDER — FUROSEMIDE 40 MG PO TABS: 40.0000 mg | ORAL_TABLET | Freq: Two times a day (BID) | ORAL | Status: DC

## 2015-01-29 MED ORDER — CARVEDILOL 6.25 MG PO TABS: 6.2500 mg | ORAL_TABLET | Freq: Two times a day (BID) | ORAL | Status: DC

## 2015-01-29 MED ORDER — CLOPIDOGREL BISULFATE 75 MG PO TABS: 75.0000 mg | ORAL_TABLET | Freq: Every day | ORAL | Status: DC

## 2015-01-29 MED ORDER — TIOTROPIUM BROMIDE MONOHYDRATE 18 MCG IN CAPS: 18.0000 ug | ORAL_CAPSULE | Freq: Every day | RESPIRATORY_TRACT | Status: DC

## 2015-01-29 NOTE — Telephone Encounter (Signed)
traMADol (ULTRAM) 50 MG tablet albuterol (PROVENTIL HFA;VENTOLIN HFA) 108 (90 BASE) MCG/ACT inhaler tiotropium (SPIRIVA) 18 MCG inhalation capsule metoprolol tartrate (LOPRESSOR) 25 MG tablet Lisinopril '5mg'$  clopidogrel (PLAVIX) 75 MG tablet furosemide (LASIX) 40 MG tablet

## 2015-01-29 NOTE — Telephone Encounter (Signed)
RN called [facility] to clarify request because patient recently asked for Rx to go to mail order pharmacy.  Pharmacy stated that patient had called them yesterday asking for refills to be re-prescribed to them because he no longer wanted to use the mail order.  Refills sent.  Patient called and told he would need to make an appointment for refill on his Tramadol.  Patient's metoprolol was not refilled because Dr. [name] had switched him to Coreg at his last visit.

## 2015-01-31 ENCOUNTER — Telehealth: Payer: Self-pay | Admitting: *Deleted

## 2015-01-31 NOTE — Telephone Encounter (Signed)
Patient's pharmacy, [facility], called to get a refill on patient's tizanidine.  Per note in Epic that medication was discontinued.  Pharmacy verbalized understanding.

## 2015-02-06 ENCOUNTER — Observation Stay (HOSPITAL_COMMUNITY): Payer: Medicare Other

## 2015-02-06 ENCOUNTER — Emergency Department (HOSPITAL_COMMUNITY): Payer: Medicare Other

## 2015-02-06 ENCOUNTER — Encounter (HOSPITAL_COMMUNITY): Payer: Self-pay | Admitting: Emergency Medicine

## 2015-02-06 ENCOUNTER — Inpatient Hospital Stay (HOSPITAL_COMMUNITY)
Admission: EM | Admit: 2015-02-06 | Discharge: 2015-02-13 | DRG: 034 | Disposition: A | Discharge status: Skilled Nursing Facility | Payer: Medicare Other | Attending: Internal Medicine | Admitting: Internal Medicine

## 2015-02-06 ENCOUNTER — Telehealth: Payer: Self-pay | Admitting: Neurology

## 2015-02-06 ENCOUNTER — Telehealth: Payer: Self-pay | Admitting: Cardiology

## 2015-02-06 DIAGNOSIS — R29810 Facial weakness: Secondary | ICD-10-CM | POA: Diagnosis present

## 2015-02-06 DIAGNOSIS — I63519 Cerebral infarction due to unspecified occlusion or stenosis of unspecified middle cerebral artery: Secondary | ICD-10-CM | POA: Diagnosis present

## 2015-02-06 DIAGNOSIS — I6350 Cerebral infarction due to unspecified occlusion or stenosis of unspecified cerebral artery: Secondary | ICD-10-CM

## 2015-02-06 DIAGNOSIS — Z885 Allergy status to narcotic agent status: Secondary | ICD-10-CM | POA: Diagnosis not present

## 2015-02-06 DIAGNOSIS — I10 Essential (primary) hypertension: Secondary | ICD-10-CM | POA: Diagnosis present

## 2015-02-06 DIAGNOSIS — Z7982 Long term (current) use of aspirin: Secondary | ICD-10-CM | POA: Diagnosis not present

## 2015-02-06 DIAGNOSIS — Z7902 Long term (current) use of antithrombotics/antiplatelets: Secondary | ICD-10-CM

## 2015-02-06 DIAGNOSIS — Z886 Allergy status to analgesic agent status: Secondary | ICD-10-CM | POA: Diagnosis not present

## 2015-02-06 DIAGNOSIS — R0682 Tachypnea, not elsewhere classified: Secondary | ICD-10-CM | POA: Diagnosis present

## 2015-02-06 DIAGNOSIS — J45909 Unspecified asthma, uncomplicated: Secondary | ICD-10-CM | POA: Diagnosis present

## 2015-02-06 DIAGNOSIS — Z8249 Family history of ischemic heart disease and other diseases of the circulatory system: Secondary | ICD-10-CM

## 2015-02-06 DIAGNOSIS — K219 Gastro-esophageal reflux disease without esophagitis: Secondary | ICD-10-CM | POA: Diagnosis not present

## 2015-02-06 DIAGNOSIS — Z9119 Patient's noncompliance with other medical treatment and regimen: Secondary | ICD-10-CM

## 2015-02-06 DIAGNOSIS — I63231 Cerebral infarction due to unspecified occlusion or stenosis of right carotid arteries: Secondary | ICD-10-CM

## 2015-02-06 DIAGNOSIS — I6521 Occlusion and stenosis of right carotid artery: Principal | ICD-10-CM | POA: Diagnosis present

## 2015-02-06 DIAGNOSIS — E785 Hyperlipidemia, unspecified: Secondary | ICD-10-CM | POA: Diagnosis present

## 2015-02-06 DIAGNOSIS — I69354 Hemiplegia and hemiparesis following cerebral infarction affecting left non-dominant side: Secondary | ICD-10-CM

## 2015-02-06 DIAGNOSIS — J449 Chronic obstructive pulmonary disease, unspecified: Secondary | ICD-10-CM | POA: Diagnosis present

## 2015-02-06 DIAGNOSIS — R531 Weakness: Secondary | ICD-10-CM | POA: Diagnosis present

## 2015-02-06 DIAGNOSIS — F329 Major depressive disorder, single episode, unspecified: Secondary | ICD-10-CM | POA: Diagnosis present

## 2015-02-06 DIAGNOSIS — G4733 Obstructive sleep apnea (adult) (pediatric): Secondary | ICD-10-CM | POA: Diagnosis present

## 2015-02-06 DIAGNOSIS — D696 Thrombocytopenia, unspecified: Secondary | ICD-10-CM | POA: Diagnosis present

## 2015-02-06 DIAGNOSIS — F419 Anxiety disorder, unspecified: Secondary | ICD-10-CM | POA: Diagnosis present

## 2015-02-06 DIAGNOSIS — Z888 Allergy status to other drugs, medicaments and biological substances status: Secondary | ICD-10-CM | POA: Diagnosis not present

## 2015-02-06 DIAGNOSIS — I252 Old myocardial infarction: Secondary | ICD-10-CM

## 2015-02-06 DIAGNOSIS — R471 Dysarthria and anarthria: Secondary | ICD-10-CM | POA: Diagnosis present

## 2015-02-06 DIAGNOSIS — I480 Paroxysmal atrial fibrillation: Secondary | ICD-10-CM | POA: Diagnosis present

## 2015-02-06 DIAGNOSIS — I639 Cerebral infarction, unspecified: Secondary | ICD-10-CM | POA: Diagnosis present

## 2015-02-06 DIAGNOSIS — I739 Peripheral vascular disease, unspecified: Secondary | ICD-10-CM | POA: Diagnosis present

## 2015-02-06 DIAGNOSIS — M1000 Idiopathic gout, unspecified site: Secondary | ICD-10-CM | POA: Diagnosis present

## 2015-02-06 DIAGNOSIS — J42 Unspecified chronic bronchitis: Secondary | ICD-10-CM | POA: Diagnosis not present

## 2015-02-06 DIAGNOSIS — Z720 Tobacco use: Secondary | ICD-10-CM | POA: Diagnosis not present

## 2015-02-06 DIAGNOSIS — I2510 Atherosclerotic heart disease of native coronary artery without angina pectoris: Secondary | ICD-10-CM | POA: Diagnosis present

## 2015-02-06 DIAGNOSIS — Z79899 Other long term (current) drug therapy: Secondary | ICD-10-CM | POA: Diagnosis not present

## 2015-02-06 DIAGNOSIS — Z86718 Personal history of other venous thrombosis and embolism: Secondary | ICD-10-CM

## 2015-02-06 DIAGNOSIS — F17210 Nicotine dependence, cigarettes, uncomplicated: Secondary | ICD-10-CM | POA: Diagnosis present

## 2015-02-06 DIAGNOSIS — Z8673 Personal history of transient ischemic attack (TIA), and cerebral infarction without residual deficits: Secondary | ICD-10-CM | POA: Insufficient documentation

## 2015-02-06 DIAGNOSIS — I63511 Cerebral infarction due to unspecified occlusion or stenosis of right middle cerebral artery: Secondary | ICD-10-CM | POA: Diagnosis not present

## 2015-02-06 HISTORY — DX: Cerebral infarction, unspecified: I63.9

## 2015-02-06 LAB — CBC WITH DIFFERENTIAL/PLATELET
BASOS ABS: 0 10*3/uL (ref 0.0–0.1)
BASOS PCT: 1 %
EOS PCT: 4 %
Eosinophils Absolute: 0.2 10*3/uL (ref 0.0–0.7)
HCT: 47.2 % (ref 39.0–52.0)
Hemoglobin: 15.6 g/dL (ref 13.0–17.0)
LYMPHS PCT: 28 %
Lymphs Abs: 1.5 10*3/uL (ref 0.7–4.0)
MCH: 31 pg (ref 26.0–34.0)
MCHC: 33.1 g/dL (ref 30.0–36.0)
MCV: 93.8 fL (ref 78.0–100.0)
MONO ABS: 0.7 10*3/uL (ref 0.1–1.0)
Monocytes Relative: 13 %
NEUTROS ABS: 2.9 10*3/uL (ref 1.7–7.7)
Neutrophils Relative %: 54 %
PLATELETS: 125 10*3/uL — AB (ref 150–400)
RBC: 5.03 MIL/uL (ref 4.22–5.81)
RDW: 14.2 % (ref 11.5–15.5)
WBC: 5.3 10*3/uL (ref 4.0–10.5)

## 2015-02-06 LAB — COMPREHENSIVE METABOLIC PANEL
ALT: 17 U/L (ref 17–63)
AST: 16 U/L (ref 15–41)
Albumin: 3.7 g/dL (ref 3.5–5.0)
Alkaline Phosphatase: 66 U/L (ref 38–126)
Anion gap: 10 (ref 5–15)
BILIRUBIN TOTAL: 0.5 mg/dL (ref 0.3–1.2)
BUN: 9 mg/dL (ref 6–20)
CALCIUM: 10 mg/dL (ref 8.9–10.3)
CHLORIDE: 110 mmol/L (ref 101–111)
CO2: 25 mmol/L (ref 22–32)
CREATININE: 0.92 mg/dL (ref 0.61–1.24)
GLUCOSE: 115 mg/dL — AB (ref 65–99)
Potassium: 4.3 mmol/L (ref 3.5–5.1)
Sodium: 145 mmol/L (ref 135–145)
TOTAL PROTEIN: 6.2 g/dL — AB (ref 6.5–8.1)

## 2015-02-06 LAB — LIPID PANEL
CHOL/HDL RATIO: 4.9 ratio
CHOLESTEROL: 231 mg/dL — AB (ref 0–200)
HDL: 47 mg/dL (ref >40)
LDL CALC: 150 mg/dL — AB (ref 0–99)
TRIGLYCERIDES: 172 mg/dL — AB (ref <150)
VLDL: 34 mg/dL (ref 0–40)

## 2015-02-06 MED ORDER — TIZANIDINE HCL 4 MG PO TABS
4.0000 mg | ORAL_TABLET | Freq: Three times a day (TID) | ORAL | Status: DC | PRN
  Filled 2015-02-06: qty 1

## 2015-02-06 MED ORDER — LORAZEPAM 1 MG PO TABS: 1.0000 mg | ORAL_TABLET | Freq: Four times a day (QID) | ORAL | Status: AC | PRN

## 2015-02-06 MED ORDER — THIAMINE HCL 100 MG/ML IJ SOLN: 100.0000 mg | Freq: Every day | INTRAMUSCULAR | Status: DC

## 2015-02-06 MED ORDER — FOLIC ACID 1 MG PO TABS
1.0000 mg | ORAL_TABLET | Freq: Every day | ORAL | Status: DC
  Administered 2015-02-06 – 2015-02-13 (×7): 1 mg via ORAL
  Filled 2015-02-06 (×7): qty 1

## 2015-02-06 MED ORDER — ASPIRIN 325 MG PO TABS
325.0000 mg | ORAL_TABLET | Freq: Every day | ORAL | Status: DC
  Administered 2015-02-06 – 2015-02-07 (×2): 325 mg via ORAL
  Filled 2015-02-06 (×2): qty 1

## 2015-02-06 MED ORDER — DICLOFENAC SODIUM 1 % TD GEL
4.0000 g | Freq: Four times a day (QID) | TRANSDERMAL | Status: DC | PRN
  Administered 2015-02-13: 4 g via TOPICAL
  Filled 2015-02-06 (×2): qty 100

## 2015-02-06 MED ORDER — SENNOSIDES-DOCUSATE SODIUM 8.6-50 MG PO TABS
1.0000 | ORAL_TABLET | Freq: Every evening | ORAL | Status: DC | PRN
  Administered 2015-02-10: 1 via ORAL
  Filled 2015-02-06: qty 1

## 2015-02-06 MED ORDER — METHOCARBAMOL 500 MG PO TABS
500.0000 mg | ORAL_TABLET | Freq: Two times a day (BID) | ORAL | Status: DC | PRN
Start: 2015-02-06 — End: 2015-02-13
  Administered 2015-02-06 – 2015-02-11 (×3): 500 mg via ORAL
  Filled 2015-02-06 (×3): qty 1

## 2015-02-06 MED ORDER — PANTOPRAZOLE SODIUM 40 MG PO TBEC
40.0000 mg | DELAYED_RELEASE_TABLET | Freq: Every day | ORAL | Status: DC
  Administered 2015-02-06 – 2015-02-13 (×7): 40 mg via ORAL
  Filled 2015-02-06 (×7): qty 1

## 2015-02-06 MED ORDER — MOMETASONE FURO-FORMOTEROL FUM 200-5 MCG/ACT IN AERO
2.0000 | INHALATION_SPRAY | Freq: Two times a day (BID) | RESPIRATORY_TRACT | Status: DC
  Administered 2015-02-06 – 2015-02-13 (×13): 2 via RESPIRATORY_TRACT
  Filled 2015-02-06 (×2): qty 8.8

## 2015-02-06 MED ORDER — ALBUTEROL SULFATE (2.5 MG/3ML) 0.083% IN NEBU: 2.5000 mg | INHALATION_SOLUTION | Freq: Four times a day (QID) | RESPIRATORY_TRACT | Status: DC | PRN

## 2015-02-06 MED ORDER — ATORVASTATIN CALCIUM 40 MG PO TABS
40.0000 mg | ORAL_TABLET | Freq: Every day | ORAL | Status: DC
  Administered 2015-02-06: 40 mg via ORAL
  Filled 2015-02-06 (×2): qty 1

## 2015-02-06 MED ORDER — STROKE: EARLY STAGES OF RECOVERY BOOK
Freq: Once | Status: AC
  Administered 2015-02-06: 21:00:00
  Filled 2015-02-06: qty 1

## 2015-02-06 MED ORDER — VITAMIN B-1 100 MG PO TABS
100.0000 mg | ORAL_TABLET | Freq: Every day | ORAL | Status: DC
  Administered 2015-02-06 – 2015-02-13 (×7): 100 mg via ORAL
  Filled 2015-02-06 (×7): qty 1

## 2015-02-06 MED ORDER — GATIFLOXACIN 0.5 % OP SOLN
1.0000 [drp] | Freq: Four times a day (QID) | OPHTHALMIC | Status: DC
  Administered 2015-02-06 – 2015-02-13 (×25): 1 [drp] via OPHTHALMIC
  Filled 2015-02-06 (×2): qty 2.5

## 2015-02-06 MED ORDER — LORAZEPAM 2 MG/ML IJ SOLN: 1.0000 mg | Freq: Four times a day (QID) | INTRAMUSCULAR | Status: AC | PRN

## 2015-02-06 MED ORDER — TRAMADOL HCL 50 MG PO TABS
100.0000 mg | ORAL_TABLET | Freq: Three times a day (TID) | ORAL | Status: DC | PRN
  Administered 2015-02-10 – 2015-02-11 (×3): 100 mg via ORAL
  Filled 2015-02-06 (×3): qty 2

## 2015-02-06 MED ORDER — ADULT MULTIVITAMIN W/MINERALS CH
1.0000 | ORAL_TABLET | Freq: Every day | ORAL | Status: DC
  Administered 2015-02-06 – 2015-02-13 (×7): 1 via ORAL
  Filled 2015-02-06 (×7): qty 1

## 2015-02-06 MED ORDER — TRAMADOL HCL 50 MG PO TABS: 50.0000 mg | ORAL_TABLET | Freq: Four times a day (QID) | ORAL | Status: DC | PRN

## 2015-02-06 MED ORDER — TIOTROPIUM BROMIDE MONOHYDRATE 18 MCG IN CAPS
18.0000 ug | ORAL_CAPSULE | Freq: Every day | RESPIRATORY_TRACT | Status: DC
  Administered 2015-02-06 – 2015-02-13 (×7): 18 ug via RESPIRATORY_TRACT
  Filled 2015-02-06 (×2): qty 5

## 2015-02-06 MED ORDER — CLOPIDOGREL BISULFATE 75 MG PO TABS
75.0000 mg | ORAL_TABLET | Freq: Every day | ORAL | Status: DC
  Administered 2015-02-06 – 2015-02-07 (×2): 75 mg via ORAL
  Filled 2015-02-06 (×3): qty 1

## 2015-02-06 NOTE — Telephone Encounter (Signed)
Noted. Thank you. He had recurrent right MCA and ACA infarcts due to right ICA high grade stenosis. He will need angio and then intracranial stenting. I will take care of it. Thanks.  Rosalin Hawking, MD PhD Stroke Neurology 02/06/2015 4:37 PM

## 2015-02-06 NOTE — H&P (Signed)
[facility] History and Physical  [name]:[id number] DOB: Aug 13, 1950 DOA: 02/06/2015  Referring physician: [place] PCP: Kenneth Morale, MD   Chief Complaint: worsening Left sided weakness  HPI: [name] is a 64 y.o. male  medical history that includes hypertension, hyperlipidemia, diabetes type 2, stroke with residual left-sided weakness (most recent 07/2014 and 08/2014), CAD, MI, chronic congestive heart failure, EtOH abuse, drug abuse, DVT/PE poor compliance with anticoagulation and antiplatelet therapy, COPD into the emergency room with the chief complaint of worsening left-sided weakness. Initial evaluation concerning for recurrent stroke.  Information is obtained from the patient who reports being his usual state of health until 2 days ago when he developed worsening left-sided weakness. Associated symptoms include left-sided numbness and tingling. He reports being his chair was unable to get up. He was unable to call for assistance, take his medications, take in nourishment  Hydration or go to the toilet. Usually he has a caregiver 2 hours a day but they did not come due to the holiday. Data caregiver noticed worsening left-sided weakness drift. He denies chest pain palpitations headache dizziness syncope or near-syncope. He denies difficulty chewing or swallowing. In addition he complains of a lateral lower extremity edema that is chronic and he reports has improved somewhat.  In the emergency department he is afebrile hemodynamically stable with a blood pressure at the high end of normal range he is not hypoxic.   Review of Systems:  And point review of systems complete and all systems are negative except as indicated in the history of present illness Past Medical History  Diagnosis Date  . Depression   . Hypertension   . Hyperlipidemia   . Myocardial infarction ([place]) 2000  . CHF (congestive heart failure) ([place])   . COPD (chronic obstructive pulmonary  disease) ([place])   . Peripheral vascular disease, unspecified ([place])     08/20/10 doppler: increase in right ABI post-op. Left ABI stable. S/P bi-fem bypass surgery  . GERD (gastroesophageal reflux disease)   . Asthma   . Anxiety   . History of DVT of lower extremity   . CAD (coronary artery disease) 05/27/10    Cath: severe single vessell CAD left cx midportion obtuse marginal 2 to 3.  . Shortness of breath   . Pneumonia 04/04/2012  . Arthritis   . OSA on CPAP   . Hyperlipemia   . COPD (chronic obstructive pulmonary disease) ([place])   . Alcohol abuse     H/O  . Tobacco abuse   . Orbital fracture ([place]) 12/2012  . Stroke [facility])    Past Surgical History  Procedure Laterality Date  . Femoral bypass  08/19/10    Right Fem-Pop  . Aortogram w/ ptca  09/292003  . Eye surgery    . Back surgery    . Total knee arthroplasty    . Cholecystectomy    . Cardiac catheterization  05/27/10    severe CAD left cx  . Abdoninal ao angio & bifem angio  05/27/10    Patent graft, occluded bil stents with no retrograde flow into the hypogastric arteries. 100% occl left ant. tibial artery, 70% to 80% to 100% stenosis right superficial fem artery above adductor canal. 100 % occl right ant. tibial vessell  . Esophagogastric fundoplication    . Tee without cardioversion N/A 08/29/2014    Procedure: TRANSESOPHAGEAL ECHOCARDIOGRAM (TEE);  Surgeon: Kenneth Hector, MD;  [facility] ENDOSCOPY;  Service: Cardiovascular;  Laterality: N/A;  . Peripheral vascular catheterization N/A  12/10/2014    Procedure: Abdominal Aortogram;  Surgeon: Angelia Mould, MD;  Location: [facility] LAB;  Service: Cardiovascular;  Laterality: N/A;   Social History:  reports that he has been smoking Cigars and Cigarettes.  He started smoking about 54 years ago. He has a 50 pack-year smoking history. He has quit using smokeless tobacco. His smokeless tobacco use included Chew. He reports that he drinks about 0.6 oz of alcohol per week.  He reports that he does not use illicit drugs.  Allergies  Allergen Reactions  . Darvocet [Propoxyphene N-Acetaminophen] Hives  . Haldol [Haloperidol Decanoate] Hives  . Acetaminophen Nausea Only    Upset stomach, tolerates Hydrocodone/APAP if taken with food Upset stomach, tolerates Hydrocodone/APAP if taken with food  . Norco [Hydrocodone-Acetaminophen] Nausea And Vomiting    Tolerates if taken with food    Family History  Problem Relation Age of Onset  . Heart attack Mother   . Cirrhosis Father   . Heart failure Mother   . Heart failure Brother   . Cancer Brother   . Hypertension Neg Hx     UNKNOWN  . Stroke Neg Hx     UNKNOWN     Prior to Admission medications   Medication Sig Start Date End Date Taking? Authorizing Provider  albuterol (PROVENTIL HFA;VENTOLIN HFA) 108 (90 BASE) MCG/ACT inhaler Inhale 2 puffs into the lungs every 6 (six) hours as needed for wheezing or shortness of breath. 01/29/15  Yes Kenneth Morale, MD  albuterol (PROVENTIL) (2.5 MG/3ML) 0.083% nebulizer solution Take 3 mLs (2.5 mg total) by nebulization 2 (two) times daily as needed for wheezing or shortness of breath. 01/16/15  Yes Kenneth Morale, MD  aspirin 325 MG EC tablet Take 1 tablet (325 mg total) by mouth daily. Patient taking differently: Take 325 mg by mouth every 6 (six) hours as needed for pain.  01/16/15  Yes Kenneth Morale, MD  aspirin EC 81 MG tablet Take 81 mg by mouth daily.   Yes Historical Provider, MD  atorvastatin (LIPITOR) 40 MG tablet Take 1 tablet (40 mg total) by mouth daily at 6 PM. 01/16/15  Yes Kenneth Morale, MD  carvedilol (COREG) 6.25 MG tablet Take 1 tablet (6.25 mg total) by mouth 2 (two) times daily with a meal. 01/29/15  Yes Kenneth Morale, MD  clopidogrel (PLAVIX) 75 MG tablet Take 1 tablet (75 mg total) by mouth daily. 01/29/15  Yes Kenneth Morale, MD  furosemide (LASIX) 40 MG tablet Take 1 tablet (40 mg total) by mouth 2 (two) times daily. 01/29/15  Yes Kenneth Morale, MD    mometasone-formoterol (DULERA) 200-5 MCG/ACT AERO Inhale 2 puffs into the lungs 2 (two) times daily. 01/16/15  Yes Kenneth Morale, MD  omeprazole (PRILOSEC) 20 MG capsule Take 1 capsule (20 mg total) by mouth daily. 01/16/15  Yes Kenneth Morale, MD  tiotropium (SPIRIVA) 18 MCG inhalation capsule Place 1 capsule (18 mcg total) into inhaler and inhale daily. 01/29/15  Yes Kenneth Morale, MD  tiZANidine (ZANAFLEX) 4 MG tablet Take 1 tablet (4 mg total) by mouth 3 (three) times daily. Patient taking differently: Take 4 mg by mouth every 8 (eight) hours as needed for muscle spasms.  01/16/15  Yes Kenneth Morale, MD  Besifloxacin HCl (BESIVANCE) 0.6 % SUSP Place 1 drop into the left eye 2 (two) times daily.    Historical Provider, MD  Difluprednate (DUREZOL) 0.05 % EMUL Place 1 drop into the left eye 2 (two) times daily.    Historical Provider, MD  metoprolol tartrate (LOPRESSOR) 25 MG tablet Take 1 tablet (25 mg total) by mouth 2 (two) times daily. Patient not taking: Reported on 02/06/2015 01/16/15   Kenneth Morale, MD  traMADol (ULTRAM) 50 MG tablet Take 1 tablet (50 mg total) by mouth every 8 (eight) hours as needed. Patient not taking: Reported on 02/06/2015 12/28/14   Kenneth Morale, MD   Physical Exam: Filed Vitals:   02/06/15 1315 02/06/15 1330 02/06/15 1400 02/06/15 1500  BP: 179/101 170/102 180/82 178/87  Pulse: 84 69 86 87  Temp:      Resp: 32 '26 23 22  '$ SpO2: 97% 95% 92% 96%    Wt Readings from Last 3 Encounters:  01/23/15 111.403 kg (245 lb 9.6 oz)  12/10/14 113.399 kg (250 lb)  12/04/14 110.678 kg (244 lb)    General:  Appears calm and comfortable, obese Eyes: PERRL, normal lids, irises & conjunctiva ENT: grossly normal hearing, lips & tongue his membranes of his mouth are slightly pale slightly dry Neck: no LAD, masses or thyromegaly Cardiovascular: RRR, no m/r/g. Trace to 1+ LE edema. Particularly top of feet  Respiratory: CTA bilaterally, no w/r/r. Normal respiratory effort. Rest sounds  diminished Abdomen: soft, ntnd, the spot positive bowel sounds nontender to palpation Skin: no rash or induration seen on limited exam Musculoskeletal: grossly normal tone BUE/BLE Psychiatric: grossly normal mood and affect, speech fluent and appropriate Neurologic: grossly non-focal. Speech clear facial symmetry tongue midline . Left grip 3 out of 5 right grip 5 out of 5 +drift          Labs on Admission:  Basic Metabolic Panel:  Recent Labs Lab 02/06/15 1310  NA 145  K 4.3  CL 110  CO2 25  GLUCOSE 115*  BUN 9  CREATININE 0.92  CALCIUM 10.0   Liver Function Tests:  Recent Labs Lab 02/06/15 1310  AST 16  ALT 17  ALKPHOS 66  BILITOT 0.5  PROT 6.2*  ALBUMIN 3.7   No results for input(s): LIPASE, AMYLASE in the last 168 hours. No results for input(s): AMMONIA in the last 168 hours. CBC:  Recent Labs Lab 02/06/15 1310  WBC 5.3  NEUTROABS 2.9  HGB 15.6  HCT 47.2  MCV 93.8  PLT 125*   Cardiac Enzymes: No results for input(s): CKTOTAL, CKMB, CKMBINDEX, TROPONINI in the last 168 hours.  BNP (last 3 results)  Recent Labs  06/18/14 1918 06/23/14 1858 08/19/14 2018  BNP 50.9 42.4 65.4    ProBNP (last 3 results)  Recent Labs  09/18/14 0954  PROBNP 95.95    CBG: No results for input(s): GLUCAP in the last 168 hours.  Radiological Exams on Admission: Ct Head Wo Contrast  02/06/2015  CLINICAL DATA:  Left-sided weakness for 2-3 days. History of multiple strokes. EXAM: CT HEAD WITHOUT CONTRAST TECHNIQUE: Contiguous axial images were obtained from the base of the skull through the vertex without intravenous contrast. COMPARISON:  08/27/2014 FINDINGS: Mild chronic small vessel disease throughout the deep white matter. No acute intracranial abnormality. Specifically, no hemorrhage, hydrocephalus, mass lesion, acute infarction, or significant intracranial injury. No acute calvarial abnormality. Opacified left maxillary sinus is stable since prior study.  Postoperative changes in the left anterior maxillary wall and floor the left orbit. Mastoid air cells are clear. IMPRESSION: Chronic small vessel disease throughout the deep white matter. No acute intracranial abnormality. Electronically Signed   By: Rolm Baptise M.D.   On: 02/06/2015 12:42    EKG: Independently reviewed. Sinus rhythm old anterior infarct  Assessment/Plan Principal Problem:   Stroke (cerebrum) (HCC) Active Problems:   COPD (chronic obstructive pulmonary disease) (HCC)   HTN (hypertension)   Peripheral vascular disease (HCC)   Tobacco abuse   GERD (gastroesophageal reflux disease)   PAF (paroxysmal atrial fibrillation) ([place])   #1. Stroke/worsening left sided weakness/drift. CT of the head negative. MRI with acute right MCA and MCA/ACA watershed infarcts. Of note neurology note dated today indicates patient will need angiogram and then intracranial stenting. Neuro to arrange -Admit to telemetry -Follow stroke protocol -Bedside swallow eval then resume heart healthy car modified diet -MRI MRA brain -Carotid Dopplers -2-D echo -Statin -On Plavix  #2. Peripheral vascular disease. Chart review indicates recent evaluation by vascular surgery who opined T level arterial occlusive disease with rest and pain right lower extremity recommending right femoral to below knee popliteal artery bypass. This would be a redo. Rescheduled for January 10. Note chart review indicates vascular surgery recommending continuing Plavix and decreasing aspirin 81 mg in preparation for surgery.  #3. Hypertension. Fair control on the emergency department. Home medications include Coreg, Lasix and metoprolol -We'll hold these for now to allow for permissive hypertension -Monitor blood pressure closely  #4. COPD. Stable at baseline -Continue home inhaler -And home spiriva  #5. Tobacco use. -Patient counseling offered  neurology  Code Status: full DVT Prophylaxis: Family Communication:  none Disposition Plan: home  Time spent: 52 minutes  [facility]

## 2015-02-06 NOTE — Telephone Encounter (Signed)
FYI

## 2015-02-06 NOTE — ED Notes (Signed)
Pt from home, per [facility], pt c/o L sided weakness in L arm, Pain in L hip and left leg. Started Monday. Pt fine on Sunday. Pt home health aid called 911. Pt supposed to have a stent put in his right leg. Pt hx of three strokes recently, hx of L sided weakenss. Weakness and drift in L arm is "worse" compared to normal. 12 lead unremarkable. CBG 138.

## 2015-02-06 NOTE — Telephone Encounter (Signed)
ERROR

## 2015-02-06 NOTE — Consult Note (Signed)
Neurology Consultation Reason for Consult: Stroke Referring Physician: Saralyn Pilar, D  CC: Stroke  History is obtained from: Patient  HPI: [name] is a 64 y.o. male with a history of previous strokes in the right MCA distribution with right MCA stenosis who presents with recurrent strokes in this distribution. He states that 2 nights ago he had left-sided weakness and numbness which then subsequently improved after approximately 3 hours. Last night, 12/27, he had progressive worsening of the symptoms again which did not improve. He subsequently sought care in the emergency department which shows recurrent infarcts in the watershed distributions of the right MCA territory.   LKW: 12/26 tpa given?: no, out of window    ROS: A 14 point ROS was performed and is negative except as noted in the HPI.   Past Medical History  Diagnosis Date  . Depression   . Hypertension   . Hyperlipidemia   . Myocardial infarction ([place]) 2000  . CHF (congestive heart failure) ([place])   . COPD (chronic obstructive pulmonary disease) ([place])   . Peripheral vascular disease, unspecified ([place])     08/20/10 doppler: increase in right ABI post-op. Left ABI stable. S/P bi-fem bypass surgery  . GERD (gastroesophageal reflux disease)   . Asthma   . Anxiety   . History of DVT of lower extremity   . CAD (coronary artery disease) 05/27/10    Cath: severe single vessell CAD left cx midportion obtuse marginal 2 to 3.  . Shortness of breath   . Pneumonia 04/04/2012  . Arthritis   . OSA on CPAP   . Hyperlipemia   . COPD (chronic obstructive pulmonary disease) ([place])   . Alcohol abuse     H/O  . Tobacco abuse   . Orbital fracture ([place]) 12/2012  . Stroke [facility])      Family History  Problem Relation Age of Onset  . Heart attack Mother   . Cirrhosis Father   . Heart failure Mother   . Heart failure Brother   . Cancer Brother   . Hypertension Neg Hx     UNKNOWN  . Stroke Neg Hx     UNKNOWN     Social  History:  reports that he has been smoking Cigars and Cigarettes.  He started smoking about 54 years ago. He has a 50 pack-year smoking history. He has quit using smokeless tobacco. His smokeless tobacco use included Chew. He reports that he drinks about 0.6 oz of alcohol per week. He reports that he does not use illicit drugs.   Exam: Current vital signs: BP 167/97 mmHg  Pulse 86  Temp(Src) 98.7 F (37.1 C) (Oral)  Resp 16  SpO2 95% Vital signs in last 24 hours: Temp:  [97.8 F (36.6 C)-98.7 F (37.1 C)] 98.7 F (37.1 C) (12/28 2000) Pulse Rate:  [69-89] 86 (12/28 2000) Resp:  [16-32] 16 (12/28 2000) BP: (167-186)/(82-102) 167/97 mmHg (12/28 2000) SpO2:  [92 %-99 %] 95 % (12/28 2000)   Physical Exam  Constitutional: Appears well-developed and well-nourished.  Psych: Affect appropriate to situation Eyes: No scleral injection HENT: No OP obstrucion Head: Normocephalic.  Cardiovascular: Normal rate and regular rhythm.  Respiratory: Effort normal and breath sounds normal to anterior ascultation GI: Soft.  Diffusely tender without guarding.  Skin: WDI  Neuro: Mental Status: Patient is awake, alert, oriented to person, place, month, year, and situation. Patient is able to give a clear and coherent history. No signs of aphasia or neglect Cranial Nerves: II: Visual Fields  are full, no extinction to visual double simultaneous stimulation. Pupils are equal, round, and reactive to light.   III,IV, VI: EOMI without ptosis or diploplia.  V: Facial sensation is symmetric to temperature VII: Facial movement is mildly weak on the left VIII: hearing is intact to voice X: Uvula elevates symmetrically XI: Shoulder shrug is symmetric. XII: tongue is midline without atrophy or fasciculations.  Motor: Tone is normal. Bulk is normal. 5/5 strength was present on the right, he has 4/5 arm, 3/5 hip flexion, 4/5 ankle plantarflexion and 2/5 dorsiflexion.  Sensory: Sensation is symmetric to  light touch and temperature in the arms and legs. He does not extinguish to double simultaneous stimulation Cerebellar: Consistent with weakness on left intact finger-nose-finger on the right         I have reviewed labs in epic and the results pertinent to this consultation are: CMP, CBC unremarkable  I have reviewed the images obtained: MRI brain-  Impression: 64 year old male with recurrent infarcts in the distribution of the right MCA despite  medical therapy. At this point, I may manage cranial stenting may need to be considered and it appears that Dr. Erlinda Hong has been made aware of this patient and agrees.  Recommendations: 1) permissive hypertension 2) continue dual antiplatelet therapy 3) will likely need formal angiogram and consideration of stenting 4) I have made the patient NPO in case this can be arranged tomorrow, if it won't happen tomorrow then a diet will need to be reinstituted. 5) continue statin 6) stroke team to follow   Roland Rack, MD [facility] [phone]  If 7pm- 7am, please page neurology on call as listed in [place].

## 2015-02-06 NOTE — ED Provider Notes (Signed)
CSN: [id number]     Arrival date & time 02/06/15  1153 History   First MD Initiated Contact with Patient 02/06/15 1200     Chief Complaint  Patient presents with  . Stroke Symptoms     (Consider location/radiation/quality/duration/timing/severity/associated sxs/prior Treatment) Patient is a 64 y.o. male presenting with weakness. The history is provided by the patient (The patient states that he started with significant weakness in his left leg and moderate weakness in his left arm 2 days ago. He was barely able to get up and walk secondary to the weakness. His care provider was not they are Monday or Tuesday came in to ).  Weakness This is a new problem. The current episode started more than 2 days ago. The problem occurs constantly. The problem has not changed since onset.Pertinent negatives include no chest pain, no abdominal pain and no headaches. Nothing aggravates the symptoms. Nothing relieves the symptoms.    Past Medical History  Diagnosis Date  . Depression   . Hypertension   . Hyperlipidemia   . Myocardial infarction ([place]) 2000  . CHF (congestive heart failure) ([place])   . COPD (chronic obstructive pulmonary disease) ([place])   . Peripheral vascular disease, unspecified ([place])     08/20/10 doppler: increase in right ABI post-op. Left ABI stable. S/P bi-fem bypass surgery  . GERD (gastroesophageal reflux disease)   . Asthma   . Anxiety   . History of DVT of lower extremity   . CAD (coronary artery disease) 05/27/10    Cath: severe single vessell CAD left cx midportion obtuse marginal 2 to 3.  . Shortness of breath   . Pneumonia 04/04/2012  . Arthritis   . OSA on CPAP   . Hyperlipemia   . COPD (chronic obstructive pulmonary disease) ([place])   . Alcohol abuse     H/O  . Tobacco abuse   . Orbital fracture ([place]) 12/2012  . Stroke [facility])    Past Surgical History  Procedure Laterality Date  . Femoral bypass  08/19/10    Right Fem-Pop  . Aortogram w/ ptca  09/292003  . Eye  surgery    . Back surgery    . Total knee arthroplasty    . Cholecystectomy    . Cardiac catheterization  05/27/10    severe CAD left cx  . Abdoninal ao angio & bifem angio  05/27/10    Patent graft, occluded bil stents with no retrograde flow into the hypogastric arteries. 100% occl left ant. tibial artery, 70% to 80% to 100% stenosis right superficial fem artery above adductor canal. 100 % occl right ant. tibial vessell  . Esophagogastric fundoplication    . Tee without cardioversion N/A 08/29/2014    Procedure: TRANSESOPHAGEAL ECHOCARDIOGRAM (TEE);  Surgeon: Josue Hector, MD;  Location: [facility] ENDOSCOPY;  Service: Cardiovascular;  Laterality: N/A;  . Peripheral vascular catheterization N/A 12/10/2014    Procedure: Abdominal Aortogram;  Surgeon: Angelia Mould, MD;  Location: [place] CV LAB;  Service: Cardiovascular;  Laterality: N/A;   Family History  Problem Relation Age of Onset  . Heart attack Mother   . Cirrhosis Father   . Heart failure Mother   . Heart failure Brother   . Cancer Brother   . Hypertension Neg Hx     UNKNOWN  . Stroke Neg Hx     UNKNOWN   Social History  Substance Use Topics  . Smoking status: Current Every Day Smoker -- 1.00 packs/day for 50 years    Types:  Cigars, Cigarettes    Start date: 02/09/1961  . Smokeless tobacco: Former Systems developer    Types: Chew     Comment: 2 ppd, full flavor  . Alcohol Use: 0.6 oz/week    1 Cans of beer per week     Comment: 10/24/14 no drinking for 2 weeks    Review of Systems  Constitutional: Negative for appetite change and fatigue.  HENT: Negative for congestion, ear discharge and sinus pressure.   Eyes: Negative for discharge.  Respiratory: Negative for cough.   Cardiovascular: Negative for chest pain.  Gastrointestinal: Negative for abdominal pain and diarrhea.  Genitourinary: Negative for frequency and hematuria.  Musculoskeletal: Negative for back pain.       Profound left leg weakness and moderate left arm  weakness  Skin: Negative for rash.  Neurological: Positive for weakness. Negative for seizures and headaches.  Psychiatric/Behavioral: Negative for hallucinations.      Allergies  Darvocet; Haldol; Acetaminophen; and Norco  Home Medications   Prior to Admission medications   Medication Sig Start Date End Date Taking? Authorizing Provider  albuterol (PROVENTIL HFA;VENTOLIN HFA) 108 (90 BASE) MCG/ACT inhaler Inhale 2 puffs into the lungs every 6 (six) hours as needed for wheezing or shortness of breath. 01/29/15  Yes Arnoldo Morale, MD  albuterol (PROVENTIL) (2.5 MG/3ML) 0.083% nebulizer solution Take 3 mLs (2.5 mg total) by nebulization 2 (two) times daily as needed for wheezing or shortness of breath. 01/16/15  Yes Arnoldo Morale, MD  aspirin 325 MG EC tablet Take 1 tablet (325 mg total) by mouth daily. Patient taking differently: Take 325 mg by mouth every 6 (six) hours as needed for pain.  01/16/15  Yes Arnoldo Morale, MD  aspirin EC 81 MG tablet Take 81 mg by mouth daily.   Yes Historical Provider, MD  atorvastatin (LIPITOR) 40 MG tablet Take 1 tablet (40 mg total) by mouth daily at 6 PM. 01/16/15  Yes Arnoldo Morale, MD  carvedilol (COREG) 6.25 MG tablet Take 1 tablet (6.25 mg total) by mouth 2 (two) times daily with a meal. 01/29/15  Yes Arnoldo Morale, MD  clopidogrel (PLAVIX) 75 MG tablet Take 1 tablet (75 mg total) by mouth daily. 01/29/15  Yes Arnoldo Morale, MD  furosemide (LASIX) 40 MG tablet Take 1 tablet (40 mg total) by mouth 2 (two) times daily. 01/29/15  Yes Arnoldo Morale, MD  mometasone-formoterol (DULERA) 200-5 MCG/ACT AERO Inhale 2 puffs into the lungs 2 (two) times daily. 01/16/15  Yes Arnoldo Morale, MD  omeprazole (PRILOSEC) 20 MG capsule Take 1 capsule (20 mg total) by mouth daily. 01/16/15  Yes Arnoldo Morale, MD  tiotropium (SPIRIVA) 18 MCG inhalation capsule Place 1 capsule (18 mcg total) into inhaler and inhale daily. 01/29/15  Yes Arnoldo Morale, MD  tiZANidine (ZANAFLEX) 4 MG tablet  Take 1 tablet (4 mg total) by mouth 3 (three) times daily. Patient taking differently: Take 4 mg by mouth every 8 (eight) hours as needed for muscle spasms.  01/16/15  Yes Arnoldo Morale, MD  Besifloxacin HCl (BESIVANCE) 0.6 % SUSP Place 1 drop into the left eye 2 (two) times daily.    Historical Provider, MD  Difluprednate (DUREZOL) 0.05 % EMUL Place 1 drop into the left eye 2 (two) times daily.    Historical Provider, MD  metoprolol tartrate (LOPRESSOR) 25 MG tablet Take 1 tablet (25 mg total) by mouth 2 (two) times daily. Patient not taking: Reported on 02/06/2015 01/16/15   Arnoldo Morale, MD  traMADol (ULTRAM) 50 MG tablet Take  1 tablet (50 mg total) by mouth every 8 (eight) hours as needed. Patient not taking: Reported on 02/06/2015 12/28/14   Arnoldo Morale, MD   BP 178/87 mmHg  Pulse 87  Temp(Src) 98.5 F (36.9 C)  Resp 22  SpO2 96% Physical Exam  Constitutional: He is oriented to person, place, and time. He appears well-developed.  HENT:  Head: Normocephalic.  Eyes: Conjunctivae and EOM are normal. No scleral icterus.  Neck: Neck supple. No thyromegaly present.  Cardiovascular: Normal rate and regular rhythm.  Exam reveals no gallop and no friction rub.   No murmur heard. Pulmonary/Chest: No stridor. He has no wheezes. He has no rales. He exhibits no tenderness.  Abdominal: He exhibits no distension. There is no tenderness. There is no rebound.  Musculoskeletal: Normal range of motion. He exhibits no edema.  Patient has significant left leg weakness he can only lift off the bed about an inch moderate weakness to left arm also  Lymphadenopathy:    He has no cervical adenopathy.  Neurological: He is oriented to person, place, and time. He exhibits normal muscle tone. Coordination normal.  Skin: No rash noted. No erythema.  Psychiatric: He has a normal mood and affect. His behavior is normal.    ED Course  Procedures (including critical care time) Labs Review Labs Reviewed   COMPREHENSIVE METABOLIC PANEL - Abnormal; Notable for the following:    Glucose, Bld 115 (*)    Total Protein 6.2 (*)    All other components within normal limits  CBC WITH DIFFERENTIAL/PLATELET - Abnormal; Notable for the following:    Platelets 125 (*)    All other components within normal limits  CBC WITH DIFFERENTIAL/PLATELET    Imaging Review Ct Head Wo Contrast  02/06/2015  CLINICAL DATA:  Left-sided weakness for 2-3 days. History of multiple strokes. EXAM: CT HEAD WITHOUT CONTRAST TECHNIQUE: Contiguous axial images were obtained from the base of the skull through the vertex without intravenous contrast. COMPARISON:  08/27/2014 FINDINGS: Mild chronic small vessel disease throughout the deep white matter. No acute intracranial abnormality. Specifically, no hemorrhage, hydrocephalus, mass lesion, acute infarction, or significant intracranial injury. No acute calvarial abnormality. Opacified left maxillary sinus is stable since prior study. Postoperative changes in the left anterior maxillary wall and floor the left orbit. Mastoid air cells are clear. IMPRESSION: Chronic small vessel disease throughout the deep white matter. No acute intracranial abnormality. Electronically Signed   By: Rolm Baptise M.D.   On: 02/06/2015 12:42   I have personally reviewed and evaluated these images and lab results as part of my medical decision-making.   EKG Interpretation None      MDM   Final diagnoses:  Weakness    Patient will be admitted for stroke workup. Neurology consult that medicine admit    Milton Ferguson, MD 02/06/15 [phone]

## 2015-02-06 NOTE — Progress Notes (Signed)
Patient arrived to 82M07. Alert and oriented x4. Pt placed on tele box 5M07. Bed alarm on and call bell within reach. Will continue to monitor.

## 2015-02-06 NOTE — Telephone Encounter (Signed)
Pt's caretaker called sts he has had another stroke and is being transported to [facility]. She wanted Dr Erlinda Hong to know

## 2015-02-07 DIAGNOSIS — K219 Gastro-esophageal reflux disease without esophagitis: Secondary | ICD-10-CM

## 2015-02-07 DIAGNOSIS — Z8673 Personal history of transient ischemic attack (TIA), and cerebral infarction without residual deficits: Secondary | ICD-10-CM | POA: Insufficient documentation

## 2015-02-07 DIAGNOSIS — Z86718 Personal history of other venous thrombosis and embolism: Secondary | ICD-10-CM

## 2015-02-07 DIAGNOSIS — E785 Hyperlipidemia, unspecified: Secondary | ICD-10-CM

## 2015-02-07 LAB — RAPID URINE DRUG SCREEN, HOSP PERFORMED
AMPHETAMINES: NOT DETECTED
Barbiturates: NOT DETECTED
Benzodiazepines: NOT DETECTED
COCAINE: NOT DETECTED
OPIATES: NOT DETECTED
TETRAHYDROCANNABINOL: NOT DETECTED

## 2015-02-07 LAB — SURGICAL PCR SCREEN
MRSA, PCR: NEGATIVE
Staphylococcus aureus: NEGATIVE

## 2015-02-07 LAB — HEMOGLOBIN A1C
Hgb A1c MFr Bld: 6.7 % — ABNORMAL HIGH (ref 4.8–5.6)
MEAN PLASMA GLUCOSE: 146 mg/dL

## 2015-02-07 LAB — APTT: APTT: 28 s (ref 24–37)

## 2015-02-07 LAB — PLATELET INHIBITION P2Y12: PLATELET FUNCTION P2Y12: 207 [PRU] (ref 194–418)

## 2015-02-07 LAB — PROTIME-INR
INR: 1.08 (ref 0.00–1.49)
Prothrombin Time: 14.2 seconds (ref 11.6–15.2)

## 2015-02-07 MED ORDER — CLOPIDOGREL BISULFATE 75 MG PO TABS
75.0000 mg | ORAL_TABLET | ORAL | Status: AC
  Administered 2015-02-08: 75 mg via ORAL
  Filled 2015-02-07: qty 1

## 2015-02-07 MED ORDER — ZOLPIDEM TARTRATE 5 MG PO TABS
5.0000 mg | ORAL_TABLET | Freq: Once | ORAL | Status: AC
  Administered 2015-02-07: 5 mg via ORAL
  Filled 2015-02-07: qty 1

## 2015-02-07 MED ORDER — CEFAZOLIN SODIUM-DEXTROSE 2-3 GM-% IV SOLR
2.0000 g | INTRAVENOUS | Status: AC
  Administered 2015-02-08: 2 g via INTRAVENOUS
  Filled 2015-02-07: qty 50

## 2015-02-07 MED ORDER — SODIUM CHLORIDE 0.9 % IV SOLN
INTRAVENOUS | Status: DC
  Administered 2015-02-08 (×2): via INTRAVENOUS

## 2015-02-07 MED ORDER — ATORVASTATIN CALCIUM 80 MG PO TABS
80.0000 mg | ORAL_TABLET | Freq: Every day | ORAL | Status: DC
  Administered 2015-02-07 – 2015-02-12 (×6): 80 mg via ORAL
  Filled 2015-02-07 (×6): qty 1

## 2015-02-07 MED ORDER — NIMODIPINE 30 MG PO CAPS
0.0000 mg | ORAL_CAPSULE | ORAL | Status: AC
  Administered 2015-02-08: 30 mg via ORAL
  Filled 2015-02-07 (×2): qty 1

## 2015-02-07 MED ORDER — ASPIRIN EC 325 MG PO TBEC
325.0000 mg | DELAYED_RELEASE_TABLET | ORAL | Status: AC
Start: 2015-02-08 — End: 2015-02-08
  Administered 2015-02-08: 325 mg via ORAL
  Filled 2015-02-07 (×2): qty 1

## 2015-02-07 NOTE — Care Management Note (Signed)
Case Management Note  Patient Details  Name: [name] MRN: [id number] Date of Birth: 12/20/1950  Subjective/Objective:  Patient admitted with CVA. Patient to undergo Angiogram with possible stenting. Patient is from home alone.                   Action/Plan: Awaiting further testing. PT recommends CIR. Will await consult and continue to monitor for discharge needs.   Expected Discharge Date:                  Expected Discharge Plan:     In-House Referral:     Discharge planning Services     Post Acute Care Choice:    Choice offered to:     DME Arranged:    DME Agency:     HH Arranged:    HH Agency:     Status of Service:  In process, will continue to follow  Medicare Important Message Given:    Date Medicare IM Given:    Medicare IM give by:    Date Additional Medicare IM Given:    Additional Medicare Important Message give by:     If discussed at [place] of Stay Meetings, dates discussed:    Additional Comments:  Pollie Friar, RN 02/07/2015, 11:17 AM

## 2015-02-07 NOTE — Evaluation (Signed)
Physical Therapy Evaluation Patient Details Name: [name] MRN: [id number] DOB: [birth year] Today's Date: 02/07/2015   History of Present Illness  Pt is a 64 y/o male with a PMH of CVA in the right MCA distribution with right MCA stenosis who presents with recurrent strokes in this distribution. He states that 2 nights ago he had left-sided weakness and numbness which then subsequently improved after approximately 3 hours. On 12/27, he had progressive worsening of the symptoms again which did not improve. He subsequently sought care in the ED which shows recurrent infarcts in the watershed distributions of the right MCA territory.  Clinical Impression  Pt admitted with above diagnosis. Pt currently with functional limitations due to the deficits listed below (see PT Problem List). At the time of PT eval pt was able to perform transfers and minimal ambulation with min to mod assist. Pt will benefit from skilled PT to increase their independence and safety with mobility to allow discharge to the venue listed below.       Follow Up Recommendations CIR;Supervision/Assistance - 24 hour    Equipment Recommendations  None recommended by PT (TBD by next venue of care)    Recommendations for Other Services Rehab consult     Precautions / Restrictions Precautions Precautions: Fall Restrictions Weight Bearing Restrictions: No      Mobility  Bed Mobility Overal bed mobility: Needs Assistance Bed Mobility: Rolling;Sidelying to Sit Rolling: Min assist Sidelying to sit: Mod assist       General bed mobility comments: Pt was able to use the bed rails for support (HOB slightly elevated) during roll and trunk elevation to full sitting. Mod assist for support at the shoulders to achieve EOB.   Transfers Overall transfer level: Needs assistance Equipment used: Rolling walker (2 wheeled) Transfers: Sit to/from Stand Sit to Stand: Mod assist         General transfer comment: Assist  to power-up to full standing position. Pt was cued for hand placement on seated surface for safety.   Ambulation/Gait Ambulation/Gait assistance: Min guard Ambulation Distance (Feet): 5 Feet Assistive device: Rolling walker (2 wheeled) Gait Pattern/deviations: Step-to pattern;Decreased stride length;Decreased weight shift to left;Decreased stance time - left Gait velocity: Decreased Gait velocity interpretation: Below normal speed for age/gender General Gait Details: Pt was able to take a few steps from bed to chair with use of RW and close hands-on guarding for safety. VC's for sequencing and general safety awareness.   Stairs            Wheelchair Mobility    Modified Rankin (Stroke Patients Only)       Balance Overall balance assessment: Needs assistance Sitting-balance support: Feet supported;No upper extremity supported Sitting balance-Leahy Scale: Fair     Standing balance support: No upper extremity supported;During functional activity Standing balance-Leahy Scale: Poor Standing balance comment: Requires UE support to maintain standing balance.                              Pertinent Vitals/Pain Pain Assessment: Faces Faces Pain Scale: No hurt    Home Living Family/patient expects to be discharged to:: Private residence Living Arrangements: Alone Available Help at Discharge: Personal care attendant (2 hours a day M-F) Type of Home: Apartment Home Access: Elevator     Home Layout: One level Home Equipment: Walker - 2 wheels;Cane - single point;Grab bars - tub/shower;Shower seat      Prior Function Level of Independence: Needs  assistance         Comments: Has been using the RW for ambulation. States he has not gotten in the shower in a very long time. Aide bathes him either sitting on the shower chair (that does not fit in his tub) or on the commode. Pt states that on the weekends he can wash off at the sink but does not get a full body wash.  Aide does his grocery shopping and most cooking. States on the weekends he can heat up left overs in the microwave but rarely cooks at the stove.     Hand Dominance   Dominant Hand: Right    Extremity/Trunk Assessment   Upper Extremity Assessment: Defer to OT evaluation           Lower Extremity Assessment: RLE deficits/detail;LLE deficits/detail RLE Deficits / Details: General strength is decreased compared to what would be age appropriate. States the R side has always been his "stronger side".  LLE Deficits / Details: Significant strength deficits noted. 2/5 hip flexor, quads, and 3/5 strength in hamstrings in MMT.   Cervical / Trunk Assessment: Normal (Forward head/rounded shoulders noted.)  Communication   Communication: HOH  Cognition Arousal/Alertness: Awake/alert Behavior During Therapy: WFL for tasks assessed/performed Overall Cognitive Status: Within Functional Limits for tasks assessed                      General Comments      Exercises        Assessment/Plan    PT Assessment Patient needs continued PT services  PT Diagnosis Difficulty walking;Hemiplegia non-dominant side   PT Problem List Decreased strength;Decreased range of motion;Decreased activity tolerance;Decreased balance;Decreased mobility;Decreased knowledge of use of DME;Decreased safety awareness;Decreased knowledge of precautions  PT Treatment Interventions DME instruction;Gait training;Stair training;Functional mobility training;Therapeutic activities;Therapeutic exercise;Neuromuscular re-education;Patient/family education   PT Goals (Current goals can be found in the Care Plan section) Acute Rehab PT Goals Patient Stated Goal: Get stronger PT Goal Formulation: With patient Time For Goal Achievement: 02/21/15 Potential to Achieve Goals: Good    Frequency Min 4X/week   Barriers to discharge        Co-evaluation               End of Session Equipment Utilized During  Treatment: Gait belt Activity Tolerance: Patient limited by fatigue Patient left: in chair;with call bell/phone within reach;with chair alarm set Nurse Communication: Mobility status         Time: 6256-3893 PT Time Calculation (min) (ACUTE ONLY): 17 min   Charges:   PT Evaluation $Initial PT Evaluation Tier I: 1 Procedure     PT G CodesRolinda Roan 02-18-2015, 10:59 AM   Rolinda Roan, PT, DPT Acute Rehabilitation Services Pager: [phone]

## 2015-02-07 NOTE — Consult Note (Addendum)
Physical Medicine and Rehabilitation Consult   Reason for Consult: Left sided weakness and difficulty walking Referring Physician: Dr. Erlinda Hong.   HPI: [name] is a 64 y.o. male with history of HTN, CAD, PAD GIB, COPD, polysubstance abuse, prior strokes in R-MCA distribution with R-MCA stenosis who was admitted on 02/06/15 with 2 day history of Left sided weakness with difficulty walking.  MRI brain done showing recurrent infarcts in R-MCA/ACA watershed distribution and chronic low flow R-ICA. Neurology following for input and patient to had cerebral angio with R-ICA stent by Dr. Estanislado Pandy on 12/30. Patient with resultant left sided weakness, left hemisensory deficits and balance deficits affecting mobility. CIR recommended for follow up therapy.   Review of Systems  HENT: Negative for hearing loss.   Eyes: Positive for blurred vision (left for few weeks). Negative for double vision.  Respiratory: Positive for cough, shortness of breath (chronic) and wheezing.   Cardiovascular: Negative for chest pain and palpitations.  Gastrointestinal: Positive for heartburn. Negative for nausea and abdominal pain.  Genitourinary: Negative for dysuria and frequency.  Musculoskeletal: Positive for myalgias, back pain, joint pain (hip and knee pain) and neck pain. Negative for falls.  Neurological: Positive for sensory change and focal weakness. Negative for headaches.  Psychiatric/Behavioral: The patient is not nervous/anxious and does not have insomnia.   All other systems reviewed and are negative.   Past Medical History  Diagnosis Date  . Depression   . Hypertension   . Hyperlipidemia   . Myocardial infarction ([place]) 2000  . CHF (congestive heart failure) ([place])   . COPD (chronic obstructive pulmonary disease) ([place])   . Peripheral vascular disease, unspecified ([place])     08/20/10 doppler: increase in right ABI post-op. Left ABI stable. S/P bi-fem bypass surgery  . GERD (gastroesophageal  reflux disease)   . Asthma   . Anxiety   . History of DVT of lower extremity   . CAD (coronary artery disease) 05/27/10    Cath: severe single vessell CAD left cx midportion obtuse marginal 2 to 3.  . Shortness of breath   . Pneumonia 04/04/2012  . Arthritis   . OSA on CPAP   . Hyperlipemia   . COPD (chronic obstructive pulmonary disease) ([place])   . Alcohol abuse     H/O  . Tobacco abuse   . Orbital fracture ([place]) 12/2012  . Stroke [facility])     Past Surgical History  Procedure Laterality Date  . Femoral bypass  08/19/10    Right Fem-Pop  . Aortogram w/ ptca  09/292003  . Eye surgery    . Back surgery    . Total knee arthroplasty    . Cholecystectomy    . Cardiac catheterization  05/27/10    severe CAD left cx  . Abdoninal ao angio & bifem angio  05/27/10    Patent graft, occluded bil stents with no retrograde flow into the hypogastric arteries. 100% occl left ant. tibial artery, 70% to 80% to 100% stenosis right superficial fem artery above adductor canal. 100 % occl right ant. tibial vessell  . Esophagogastric fundoplication    . Tee without cardioversion N/A 08/29/2014    Procedure: TRANSESOPHAGEAL ECHOCARDIOGRAM (TEE);  Surgeon: Josue Hector, MD;  Location: [facility] ENDOSCOPY;  Service: Cardiovascular;  Laterality: N/A;  . Peripheral vascular catheterization N/A 12/10/2014    Procedure: Abdominal Aortogram;  Surgeon: Angelia Mould, MD;  Location: [place] CV LAB;  Service: Cardiovascular;  Laterality: N/A;  Family History  Problem Relation Age of Onset  . Heart attack Mother   . Cirrhosis Father   . Heart failure Mother   . Heart failure Brother   . Cancer Brother   . Hypertension Neg Hx     UNKNOWN  . Stroke Neg Hx     UNKNOWN    Social History:    Lives alone. Has an aide M-F to assit with meals and ADLs--usually sponge bathes. he reports that he has been smoking Cigars and Cigarettes.  He started smoking about 54 years ago. He has a 50 pack-year smoking  history. He has quit using smokeless tobacco. His smokeless tobacco use included Chew. He reports that he drinks about 0.6 oz of alcohol per week. Has history of cocaine use.   Allergies  Allergen Reactions  . Darvocet [Propoxyphene N-Acetaminophen] Hives  . Haldol [Haloperidol Decanoate] Hives  . Acetaminophen Nausea Only    Upset stomach, tolerates Hydrocodone/APAP if taken with food Upset stomach, tolerates Hydrocodone/APAP if taken with food  . Norco [Hydrocodone-Acetaminophen] Nausea And Vomiting    Tolerates if taken with food    Medications Prior to Admission  Medication Sig Dispense Refill  . albuterol (PROVENTIL HFA;VENTOLIN HFA) 108 (90 BASE) MCG/ACT inhaler Inhale 2 puffs into the lungs every 6 (six) hours as needed for wheezing or shortness of breath. 1 Inhaler 3  . albuterol (PROVENTIL) (2.5 MG/3ML) 0.083% nebulizer solution Take 3 mLs (2.5 mg total) by nebulization 2 (two) times daily as needed for wheezing or shortness of breath. 75 mL 0  . aspirin 325 MG EC tablet Take 1 tablet (325 mg total) by mouth daily. (Patient taking differently: Take 325 mg by mouth every 6 (six) hours as needed for pain. ) 30 tablet 0  . aspirin EC 81 MG tablet Take 81 mg by mouth daily.    [name] atorvastatin (LIPITOR) 40 MG tablet Take 1 tablet (40 mg total) by mouth daily at 6 PM. 30 tablet 2  . carvedilol (COREG) 6.25 MG tablet Take 1 tablet (6.25 mg total) by mouth 2 (two) times daily with a meal. 60 tablet 3  . clopidogrel (PLAVIX) 75 MG tablet Take 1 tablet (75 mg total) by mouth daily. 30 tablet 2  . furosemide (LASIX) 40 MG tablet Take 1 tablet (40 mg total) by mouth 2 (two) times daily. 60 tablet 2  . mometasone-formoterol (DULERA) 200-5 MCG/ACT AERO Inhale 2 puffs into the lungs 2 (two) times daily. 13 g 3  . omeprazole (PRILOSEC) 20 MG capsule Take 1 capsule (20 mg total) by mouth daily. 30 capsule 0  . tiotropium (SPIRIVA) 18 MCG inhalation capsule Place 1 capsule (18 mcg total) into inhaler  and inhale daily. 30 capsule 3  . tiZANidine (ZANAFLEX) 4 MG tablet Take 1 tablet (4 mg total) by mouth 3 (three) times daily. (Patient taking differently: Take 4 mg by mouth every 8 (eight) hours as needed for muscle spasms. ) 90 tablet 1  . Besifloxacin HCl (BESIVANCE) 0.6 % SUSP Place 1 drop into the left eye 2 (two) times daily.    . Difluprednate (DUREZOL) 0.05 % EMUL Place 1 drop into the left eye 2 (two) times daily.    . metoprolol tartrate (LOPRESSOR) 25 MG tablet Take 1 tablet (25 mg total) by mouth 2 (two) times daily. (Patient not taking: Reported on 02/06/2015) 60 tablet 0  . traMADol (ULTRAM) 50 MG tablet Take 1 tablet (50 mg total) by mouth every 8 (eight) hours as needed. (Patient not  taking: Reported on 02/06/2015) 30 tablet 0    Home: Home Living Family/patient expects to be discharged to:: Private residence Living Arrangements: Alone Available Help at Discharge: Personal care attendant (2 hours a day M-F) Type of Home: Apartment Home Access: Elevator Home Layout: One level Bathroom Shower/Tub: Tub/shower unit, Architectural technologist: Handicapped height Bathroom Accessibility: Yes Home Equipment: Environmental consultant - 2 wheels, Cane - single point, Grab bars - tub/shower, Careers adviser History: Prior Function Level of Independence: Needs assistance Comments: Has been using the RW for ambulation. States he has not gotten in the shower in a very long time. Aide bathes him either sitting on the shower chair (that does not fit in his tub) or on the commode. Pt states that on the weekends he can wash off at the sink but does not get a full body wash. Aide does his grocery shopping and most cooking. States on the weekends he can heat up left overs in the microwave but rarely cooks at the stove. Functional Status:  Mobility: Bed Mobility Overal bed mobility: Needs Assistance Bed Mobility: Rolling, Sidelying to Sit Rolling: Min assist Sidelying to sit: Mod assist General bed  mobility comments: Pt was able to use the bed rails for support (HOB slightly elevated) during roll and trunk elevation to full sitting. Mod assist for support at the shoulders to achieve EOB.  Transfers Overall transfer level: Needs assistance Equipment used: Rolling walker (2 wheeled) Transfers: Sit to/from Stand Sit to Stand: Mod assist General transfer comment: Assist to power-up to full standing position. Pt was cued for hand placement on seated surface for safety.  Ambulation/Gait Ambulation/Gait assistance: Min guard Ambulation Distance (Feet): 5 Feet Assistive device: Rolling walker (2 wheeled) Gait Pattern/deviations: Step-to pattern, Decreased stride length, Decreased weight shift to left, Decreased stance time - left General Gait Details: Pt was able to take a few steps from bed to chair with use of RW and close hands-on guarding for safety. VC's for sequencing and general safety awareness.  Gait velocity: Decreased Gait velocity interpretation: Below normal speed for age/gender    ADL:    Cognition: Cognition Overall Cognitive Status: Within Functional Limits for tasks assessed Orientation Level: Oriented X4 Cognition Arousal/Alertness: Awake/alert Behavior During Therapy: WFL for tasks assessed/performed Overall Cognitive Status: Within Functional Limits for tasks assessed   Blood pressure 167/76, pulse 91, temperature 98.6 F (37 C), temperature source Oral, resp. rate 15, height 6' (1.829 m), weight 116.121 kg (256 lb), SpO2 99 %. Physical Exam  Nursing note and vitals reviewed. Constitutional: He is oriented to person, place, and time. He appears well-developed and well-nourished.  HENT:  Head: Normocephalic.  Mouth/Throat: Oropharynx is clear and moist.  Soft collar in place  Eyes: Conjunctivae are normal. Pupils are equal, round, and reactive to light. No scleral icterus.  Neck: Normal range of motion. Neck supple.  Cardiovascular: Normal rate and regular  rhythm.   Respiratory: Effort normal. No respiratory distress. He has wheezes (audible upper airway). He has rhonchi in the right middle field, the right lower field, the left middle field and the left lower field. He exhibits no tenderness.  GI: Soft. Bowel sounds are normal. He exhibits no distension. There is no tenderness.  Musculoskeletal: He exhibits no edema or tenderness.  Neurological: He is alert and oriented to person, place, and time.  Speech with mild dysarthria.  Follows basic commands without difficulty.   Motor: RUE/RLE: 5/5 proximal to distal LUE: shoulder abduction 3/5, elbow flexion/extension 3+/5, finger grip  3+/5 LLE hip flexion 3-/5, knee ext 3-/5, ankle dorsi/plantar flexion 4+/5  Skin: Skin is warm and dry. No erythema.  Scattered abrasions  Psychiatric: His speech is normal. His affect is blunt.   Results for orders placed or performed during the hospital encounter of 02/06/15 (from the past 24 hour(s))  Comprehensive metabolic panel     Status: Abnormal   Collection Time: 02/06/15  1:10 PM  Result Value Ref Range   Sodium 145 135 - 145 mmol/L   Potassium 4.3 3.5 - 5.1 mmol/L   Chloride 110 101 - 111 mmol/L   CO2 25 22 - 32 mmol/L   Glucose, Bld 115 (H) 65 - 99 mg/dL   BUN 9 6 - 20 mg/dL   Creatinine, Ser 0.92 0.61 - 1.24 mg/dL   Calcium 10.0 8.9 - 10.3 mg/dL   Total Protein 6.2 (L) 6.5 - 8.1 g/dL   Albumin 3.7 3.5 - 5.0 g/dL   AST 16 15 - 41 U/L   ALT 17 17 - 63 U/L   Alkaline Phosphatase 66 38 - 126 U/L   Total Bilirubin 0.5 0.3 - 1.2 mg/dL   GFR calc non Af Amer >60 >60 mL/min   GFR calc Af Amer >60 >60 mL/min   Anion gap 10 5 - 15  CBC with Differential     Status: Abnormal   Collection Time: 02/06/15  1:10 PM  Result Value Ref Range   WBC 5.3 4.0 - 10.5 K/uL   RBC 5.03 4.22 - 5.81 MIL/uL   Hemoglobin 15.6 13.0 - 17.0 g/dL   HCT 47.2 39.0 - 52.0 %   MCV 93.8 78.0 - 100.0 fL   MCH 31.0 26.0 - 34.0 pg   MCHC 33.1 30.0 - 36.0 g/dL   RDW 14.2 11.5  - 15.5 %   Platelets 125 (L) 150 - 400 K/uL   Neutrophils Relative % 54 %   Neutro Abs 2.9 1.7 - 7.7 K/uL   Lymphocytes Relative 28 %   Lymphs Abs 1.5 0.7 - 4.0 K/uL   Monocytes Relative 13 %   Monocytes Absolute 0.7 0.1 - 1.0 K/uL   Eosinophils Relative 4 %   Eosinophils Absolute 0.2 0.0 - 0.7 K/uL   Basophils Relative 1 %   Basophils Absolute 0.0 0.0 - 0.1 K/uL  Hemoglobin A1c     Status: Abnormal   Collection Time: 02/06/15  6:30 PM  Result Value Ref Range   Hgb A1c MFr Bld 6.7 (H) 4.8 - 5.6 %   Mean Plasma Glucose 146 mg/dL  Lipid panel     Status: Abnormal   Collection Time: 02/06/15  6:30 PM  Result Value Ref Range   Cholesterol 231 (H) 0 - 200 mg/dL   Triglycerides 172 (H) <150 mg/dL   HDL 47 >40 mg/dL   Total CHOL/HDL Ratio 4.9 RATIO   VLDL 34 0 - 40 mg/dL   LDL Cholesterol 150 (H) 0 - 99 mg/dL  Surgical pcr screen     Status: None   Collection Time: 02/07/15  1:03 AM  Result Value Ref Range   MRSA, PCR NEGATIVE NEGATIVE   Staphylococcus aureus NEGATIVE NEGATIVE  Protime-INR     Status: None   Collection Time: 02/07/15  9:58 AM  Result Value Ref Range   Prothrombin Time 14.2 11.6 - 15.2 seconds   INR 1.08 0.00 - 1.49  APTT     Status: None   Collection Time: 02/07/15  9:58 AM  Result Value Ref Range  aPTT 28 24 - 37 seconds  Platelet inhibition p2y12 (Not at [facility])     Status: None   Collection Time: 02/07/15  9:58 AM  Result Value Ref Range   Platelet Function  P2Y12 207 194 - 418 PRU   Dg Chest 2 View  02/06/2015  CLINICAL DATA:  CVA. EXAM: CHEST  2 VIEW COMPARISON:  08/19/2014 chest radiograph. FINDINGS: Stable cardiomediastinal silhouette with normal heart size. No pneumothorax. No pleural effusion. Clear lungs, with no focal lung consolidation and no pulmonary edema. IMPRESSION: No active cardiopulmonary disease. Electronically Signed   By: Ilona Sorrel M.D.   On: 02/06/2015 21:54   Ct Head Wo Contrast  02/06/2015  CLINICAL DATA:  Left-sided weakness  for 2-3 days. History of multiple strokes. EXAM: CT HEAD WITHOUT CONTRAST TECHNIQUE: Contiguous axial images were obtained from the base of the skull through the vertex without intravenous contrast. COMPARISON:  08/27/2014 FINDINGS: Mild chronic small vessel disease throughout the deep white matter. No acute intracranial abnormality. Specifically, no hemorrhage, hydrocephalus, mass lesion, acute infarction, or significant intracranial injury. No acute calvarial abnormality. Opacified left maxillary sinus is stable since prior study. Postoperative changes in the left anterior maxillary wall and floor the left orbit. Mastoid air cells are clear. IMPRESSION: Chronic small vessel disease throughout the deep white matter. No acute intracranial abnormality. Electronically Signed   By: Rolm Baptise M.D.   On: 02/06/2015 12:42   Mr Brain Wo Contrast  02/06/2015  CLINICAL DATA:  64 year old male with left side weakness beginning 2 days ago. Initial encounter. EXAM: MRI HEAD WITHOUT CONTRAST TECHNIQUE: Multiplanar, multiecho pulse sequences of the brain and surrounding structures were obtained without intravenous contrast. COMPARISON:  Head CT without contrast 1233 hours today. Brain MRI 08/27/2014. FINDINGS: Chronically abnormal appearance of the right ICA siphon, but with suggestion of reconstituted flow at the right ICA terminus. Overall Major intracranial vascular flow voids are stable since July. Patchy mostly cortically based restricted diffusion in the right superior frontal gyrus involving the MCA territory (some MCA/ACA watershed area in the pre motor region). White matter extension to the right corona radiata on series 4, image 18. No contralateral left hemisphere or posterior fossa restricted diffusion. Associated patchy T2 and FLAIR hyperintensity in the acutely affected areas. No definite associated acute hemorrhage, although there is probably vascular related T2* asymmetry along the medial right hemisphere  (series 8, images 16-19) which is new since July. Chronic micro hemorrhage in the right occipital lobe is unchanged. Widespread scattered cerebral white matter T2 and FLAIR hyperintensity elsewhere in both hemispheres, greater on the right, then is stable since July. No midline shift, mass effect, evidence of mass lesion, ventriculomegaly, extra-axial collection or acute intracranial hemorrhage. Cervicomedullary junction and pituitary are within normal limits. Negative visualized cervical spine. Stable paranasal sinuses and mastoids. Interval postoperative changes to the left globe, otherwise stable orbit and scalp soft tissues. Visualized bone marrow signal is within normal limits. IMPRESSION: 1. 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 2. Chronic poor flow in the right ICA - See also 08/27/2014 Head MRA report. Electronically Signed   By: Genevie Ann M.D.   On: 02/06/2015 16:19    Assessment/Plan: Diagnosis: R-MCA/ACA watershed infarct Labs and images independently reviewed.  Records reviewed and summated above. Stroke: Continue secondary stroke prophylaxis and Risk Factor Modification listed below:   Antiplatelet therapy:  Blood Pressure Management:  Continue current medication with prn's with permisive HTN per primary team Statin  Agent:   Diabetes management:   Tobacco abuse:  Cont counseling  Left sided hemiparesis: fit for orthotics to prevent contractures (resting hand splint for day, wrist cock up splint at night, PRAFO, etc) Motor recovery: Fluoxetine  1. Does the need for close, 24 hr/day medical supervision in concert with the patient's rehab needs make it unreasonable for this patient to be served in a less intensive setting? Yes Co-Morbidities requiring supervision/potential complications: HTN (monitor and provide prns in accordance with increased physical exertion and pain), CAD (Cont meds), PAD (cont meds), GIB (Cont to monitor  Hb), COPD (monitor with increased physical activity), polysubstance abuse (cont counseling), prior strokes in R-MCA with residual deficits, DM (Monitor in accordance with exercise and adjust meds as necessary), tachypnea (monitor O2 Sats and RR with increased physical activity), thrombocytopenia (< 60,000/mm3 no resistive exercise) 2. Due to safety, skin/wound care, disease management, medication administration and patient education, does the patient require 24 hr/day rehab nursing? Yes 3. Does the patient require coordinated care of a physician, rehab nurse, PT (1-2 hrs/day, 5 days/week), OT (1-2 hrs/day, 5 days/week) and SLP (1-2 hrs/day, 5 days/week) to address physical and functional deficits in the context of the above medical diagnosis(es)? Yes Addressing deficits in the following areas: balance, endurance, locomotion, strength, transferring, bathing, dressing, toileting and psychosocial support 4. Can the patient actively participate in an intensive therapy program of at least 3 hrs of therapy per day at least 5 days per week? Not at present 5. The potential for patient to make measurable gains while on inpatient rehab is good 6. Anticipated functional outcomes upon discharge from inpatient rehab are Uncertain at present  with PT, Uncertain at present with OT, n/a with SLP. 7. Estimated rehab length of stay to reach the above functional goals is: 19-22 days. 8. Does the patient have adequate social supports and living environment to accommodate these discharge functional goals? Potentially 9. Anticipated D/C setting: Uncertain at present 10. Anticipated post D/C treatments: TBD 11. Overall Rehab/Functional Prognosis: good  RECOMMENDATIONS: This patient's condition is appropriate for continued rehabilitative care in the following setting: Uncertain at present.  Will await therpay evaluation prior to determing most appropriate discharge setting, however, if pt does have significant needs and does  not have 24/7 support on discharge would recommend SNF. Patient has agreed to participate in recommended program. Yes Note that insurance prior authorization may be required for reimbursement for recommended care.  Comment: Rehab Admissions Coordinator to follow up.  Delice Lesch, MD 02/07/2015

## 2015-02-07 NOTE — Progress Notes (Signed)
PATIENT DETAILS Name: [name] Age: 64 y.o. Sex: male Date of Birth: Jan 23, 1951 Admit Date: 02/06/2015 Admitting Physician Waldemar Dickens, MD [id number], [name], MD  Subjective: Left-sided weakness essentially unchanged.  Assessment/Plan: Principal Problem: Recurrent right MCA territory infarct: Secondary to known right ICA supraclinoid near occlusion. Continues to have left sided weakness-approximately 3-4/5. Seen by neurology, recommendations are for cerebral angiogram and stenting-on 12/30. Continue aspirin/Plavix.  Active Problems: Hypertension: Moderately Controlled, although permissive hypertension, resume antihypertensives over the next few days.  Dyslipidemia: Continue Lipitor, LDL- 150 (goal<70)  COPD: Lungs clear, continue current bronchodilator regimen.  History of CAD: No chest pain or shortness of breath, continue antiplatelets, statin, resume beta blockers in the next few days. Most recent nuclear stress test in October 2016 negative for ischemia.  History of peripheral vascular disease: Followed by vascular surgery-plans are to redo right femoral to below knee popliteal bypass-tentatively scheduled for 02/19/2015.  History of DVT: Previously on xarelto-this was discontinued because of noncompliance  GERD:PPI  Polysubstance abuse: Previous urinary drug screen is positive for cocaine/benzo/opiates. Urine drug screen pending this admission.  BLT:JQZE qhs  Disposition: Remain inpatient-?CIR on discharge  Antimicrobial agents  See below  Anti-infectives    Start     Dose/Rate Route Frequency Ordered Stop   02/08/15 0845  ceFAZolin (ANCEF) IVPB 2 g/50 mL premix     2 g 100 mL/hr over 30 Minutes Intravenous To Radiology 02/07/15 1244 02/09/15 0845      DVT Prophylaxis:  SCD's  Code Status: Full code   Family Communication None at bedside  Procedures: None  CONSULTS:  neurology and IR  Time spent 30 minutes-Greater  than 50% of this time was spent in counseling, explanation of diagnosis, planning of further management, and coordination of care.  MEDICATIONS: Scheduled Meds: . [START ON 02/08/2015] aspirin  325 mg Oral 60 min Pre-Op  . aspirin  325 mg Oral Daily  . atorvastatin  40 mg Oral q1800  . [START ON 02/08/2015]  ceFAZolin (ANCEF) IV  2 g Intravenous to XRAY  . clopidogrel  75 mg Oral Daily  . [START ON 02/08/2015] clopidogrel  75 mg Oral 60 min Pre-Op  . folic acid  1 mg Oral Daily  . gatifloxacin  1 drop Left Eye QID  . mometasone-formoterol  2 puff Inhalation BID  . multivitamin with minerals  1 tablet Oral Daily  . [START ON 02/08/2015] niMODipine  0-60 mg Oral 60 min Pre-Op  . pantoprazole  40 mg Oral Daily  . thiamine  100 mg Oral Daily   Or  . thiamine  100 mg Intravenous Daily  . tiotropium  18 mcg Inhalation Daily   Continuous Infusions: . [START ON 02/08/2015] sodium chloride     PRN Meds:.albuterol, diclofenac sodium, LORazepam **OR** LORazepam, methocarbamol, senna-docusate, tiZANidine, traMADol    PHYSICAL EXAM: Vital signs in last 24 hours: Filed Vitals:   02/07/15 0211 02/07/15 0347 02/07/15 0630 02/07/15 1104  BP: 157/89 169/78 167/76   Pulse: 78 91 91   Temp: 98.6 F (37 C) 98.6 F (37 C) 98.6 F (37 C)   TempSrc: Oral Oral Oral   Resp: '15 16 15   '$ Height:      Weight:      SpO2: 96% 95% 98% 99%    Weight change:  Filed Weights   02/07/15 0005  Weight: 116.121 kg (256 lb)   Body mass index is 34.[place]  kg/(m^2).   Gen Exam: Awake and alert with clear speech.   Neck: Supple, No JVD.   Chest: B/L Clear.   CVS: S1 S2 Regular, no murmurs.  Abdomen: soft, BS +, non tender, non distended.  Extremities: no edema, lower extremities warm to touch. Neurologic: Left sided weakness-3-4/5   Skin: No Rash.   Wounds: N/A.    Intake/Output from previous day:  Intake/Output Summary (Last 24 hours) at 02/07/15 1304 Last data filed at 02/07/15 0928  Gross per 24  hour  Intake    240 ml  Output   1400 ml  Net  -1160 ml     LAB RESULTS: CBC  Recent Labs Lab 02/06/15 1310  WBC 5.3  HGB 15.6  HCT 47.2  PLT 125*  MCV 93.8  MCH 31.0  MCHC 33.1  RDW 14.2  LYMPHSABS 1.5  MONOABS 0.7  EOSABS 0.2  BASOSABS 0.0    Chemistries   Recent Labs Lab 02/06/15 1310  NA 145  K 4.3  CL 110  CO2 25  GLUCOSE 115*  BUN 9  CREATININE 0.92  CALCIUM 10.0    CBG: No results for input(s): GLUCAP in the last 168 hours.  GFR Estimated Creatinine Clearance: 105.3 mL/min (by C-G formula based on Cr of 0.92).  Coagulation profile  Recent Labs Lab 02/07/15 0958  INR 1.08    Cardiac Enzymes No results for input(s): CKMB, TROPONINI, MYOGLOBIN in the last 168 hours.  Invalid input(s): CK  Invalid input(s): POCBNP No results for input(s): DDIMER in the last 72 hours.  Recent Labs  02/06/15 1830  HGBA1C 6.7*    Recent Labs  02/06/15 1830  CHOL 231*  HDL 47  LDLCALC 150*  TRIG 172*  CHOLHDL 4.9   No results for input(s): TSH, T4TOTAL, T3FREE, THYROIDAB in the last 72 hours.  Invalid input(s): FREET3 No results for input(s): VITAMINB12, FOLATE, FERRITIN, TIBC, IRON, RETICCTPCT in the last 72 hours. No results for input(s): LIPASE, AMYLASE in the last 72 hours.  Urine Studies No results for input(s): UHGB, CRYS in the last 72 hours.  Invalid input(s): UACOL, UAPR, USPG, UPH, UTP, UGL, UKET, UBIL, UNIT, UROB, ULEU, UEPI, UWBC, URBC, UBAC, CAST, UCOM, BILUA  MICROBIOLOGY: Recent Results (from the past 240 hour(s))  Surgical pcr screen     Status: None   Collection Time: 02/07/15  1:03 AM  Result Value Ref Range Status   MRSA, PCR NEGATIVE NEGATIVE Final   Staphylococcus aureus NEGATIVE NEGATIVE Final    Comment:        The Xpert SA Assay (FDA approved for NASAL specimens in patients over 11 years of age), is one component of a comprehensive surveillance program.  Test performance has been validated by [facility]  for patients greater than or equal to 32 year old. It is not intended to diagnose infection nor to guide or monitor treatment.     RADIOLOGY STUDIES/RESULTS: Dg Chest 2 View  02/06/2015  CLINICAL DATA:  CVA. EXAM: CHEST  2 VIEW COMPARISON:  08/19/2014 chest radiograph. FINDINGS: Stable cardiomediastinal silhouette with normal heart size. No pneumothorax. No pleural effusion. Clear lungs, with no focal lung consolidation and no pulmonary edema. IMPRESSION: No active cardiopulmonary disease. Electronically Signed   By: Ilona Sorrel M.D.   On: 02/06/2015 21:54   Ct Head Wo Contrast  02/06/2015  CLINICAL DATA:  Left-sided weakness for 2-3 days. History of multiple strokes. EXAM: CT HEAD WITHOUT CONTRAST TECHNIQUE: Contiguous axial images were obtained from the base of  the skull through the vertex without intravenous contrast. COMPARISON:  08/27/2014 FINDINGS: Mild chronic small vessel disease throughout the deep white matter. No acute intracranial abnormality. Specifically, no hemorrhage, hydrocephalus, mass lesion, acute infarction, or significant intracranial injury. No acute calvarial abnormality. Opacified left maxillary sinus is stable since prior study. Postoperative changes in the left anterior maxillary wall and floor the left orbit. Mastoid air cells are clear. IMPRESSION: Chronic small vessel disease throughout the deep white matter. No acute intracranial abnormality. Electronically Signed   By: Rolm Baptise M.D.   On: 02/06/2015 12:42   Mr Brain Wo Contrast  02/06/2015  CLINICAL DATA:  64 year old male with left side weakness beginning 2 days ago. Initial encounter. EXAM: MRI HEAD WITHOUT CONTRAST TECHNIQUE: Multiplanar, multiecho pulse sequences of the brain and surrounding structures were obtained without intravenous contrast. COMPARISON:  Head CT without contrast 1233 hours today. Brain MRI 08/27/2014. FINDINGS: Chronically abnormal appearance of the right ICA siphon, but with suggestion  of reconstituted flow at the right ICA terminus. Overall Major intracranial vascular flow voids are stable since July. Patchy mostly cortically based restricted diffusion in the right superior frontal gyrus involving the MCA territory (some MCA/ACA watershed area in the pre motor region). White matter extension to the right corona radiata on series 4, image 18. No contralateral left hemisphere or posterior fossa restricted diffusion. Associated patchy T2 and FLAIR hyperintensity in the acutely affected areas. No definite associated acute hemorrhage, although there is probably vascular related T2* asymmetry along the medial right hemisphere (series 8, images 16-19) which is new since July. Chronic micro hemorrhage in the right occipital lobe is unchanged. Widespread scattered cerebral white matter T2 and FLAIR hyperintensity elsewhere in both hemispheres, greater on the right, then is stable since July. No midline shift, mass effect, evidence of mass lesion, ventriculomegaly, extra-axial collection or acute intracranial hemorrhage. Cervicomedullary junction and pituitary are within normal limits. Negative visualized cervical spine. Stable paranasal sinuses and mastoids. Interval postoperative changes to the left globe, otherwise stable orbit and scalp soft tissues. Visualized bone marrow signal is within normal limits. IMPRESSION: 1. 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 2. Chronic poor flow in the right ICA - See also 08/27/2014 Head MRA report. Electronically Signed   By: Genevie Ann M.D.   On: 02/06/2015 16:19    Oren Binet, MD  [facility] Pager:[phone]  If 7PM-7AM, please contact night-coverage [email or web address] Password [email or web address] 02/07/2015, 1:04 PM   LOS: 1 day

## 2015-02-07 NOTE — Progress Notes (Signed)
Patient oxygen saturation decreases from >93% to 84 SaO2 when patient falls asleep. Patient placed on 2L , oxygen saturation remains at 94% SaO2 when patient is sleeping. Patient is currently on continuous O2 monitor, will continue to monitor.

## 2015-02-07 NOTE — Consult Note (Signed)
Chief Complaint: Patient was seen in consultation today for cerebral arteriogram with R ICA pta/stent Chief Complaint  Patient presents with  . Stroke Symptoms   at the request of Dr Erlinda Hong  Referring Physician(s): Dr Erlinda Hong  History of Present Illness: [name] is a 64 y.o. male   Pt with Hx CVA Symptoms of L sided weakness and numbness 12/25 pm Resolved until 12/27 when developed worsening same sxs  MRI/MRA 12/29: MRI HEAD: New subcentimeter acute infarct RIGHT corpus callosum. Acute on chronic patchy acute ischemia RIGHT frontal lobe.  Chronic changes including moderate to severe chronic small vessel ischemic disease.  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  MRA HEAD: Moderately motion degraded examination. Overall poor flow related enhancement of the RIGHT internal carotid artery may reflect proximal high-grade stenosis. In addition, extremely slow flow versus occluded RIGHT supraclinoid internal carotid artery. Findings would be better characterized on CTA of the head and neck as clinically indicated.  Moderate stenosis LEFT para clinoid internal carotid artery. High-grade stenosis RIGHT A1 segment. Overall limited assessment due to motion.  Dr Erlinda Hong has requested cerebral arteriogram with possible angioplasty/stent placement Dr Estanislado Pandy has reviewed imaging and approves procedure I have seen and examined pt; Dr Estanislado Pandy has interviewed and has seen pt also  [id number] 207 today Urine + cocaine and benzo   Past Medical History  Diagnosis Date  . Depression   . Hypertension   . Hyperlipidemia   . Myocardial infarction ([place]) 2000  . CHF (congestive heart failure) ([place])   . COPD (chronic obstructive pulmonary disease) ([place])   .  Peripheral vascular disease, unspecified ([place])     08/20/10 doppler: increase in right ABI post-op. Left ABI stable. S/P bi-fem bypass surgery  . GERD (gastroesophageal reflux disease)   . Asthma   . Anxiety   . History of DVT of lower extremity   . CAD (coronary artery disease) 05/27/10    Cath: severe single vessell CAD left cx midportion obtuse marginal 2 to 3.  . Shortness of breath   . Pneumonia 04/04/2012  . Arthritis   . OSA on CPAP   . Hyperlipemia   . COPD (chronic obstructive pulmonary disease) ([place])   . Alcohol abuse     H/O  . Tobacco abuse   . Orbital fracture ([place]) 12/2012  . Stroke [facility])     Past Surgical History  Procedure Laterality Date  . Femoral bypass  08/19/10    Right Fem-Pop  . Aortogram w/ ptca  09/292003  . Eye surgery    . Back surgery    . Total knee arthroplasty    . Cholecystectomy    . Cardiac catheterization  05/27/10    severe CAD left cx  . Abdoninal ao angio & bifem angio  05/27/10    Patent graft, occluded bil stents with no retrograde flow into the hypogastric arteries. 100% occl left ant. tibial artery, 70% to 80% to 100% stenosis right superficial fem artery above adductor canal. 100 % occl right ant. tibial vessell  . Esophagogastric fundoplication    . Tee without cardioversion N/A 08/29/2014    Procedure: TRANSESOPHAGEAL ECHOCARDIOGRAM (TEE);  Surgeon: Josue Hector, MD;  Location: [facility] ENDOSCOPY;  Service: Cardiovascular;  Laterality: N/A;  . Peripheral vascular catheterization N/A 12/10/2014  Procedure: Abdominal Aortogram;  Surgeon: Angelia Mould, MD;  Location: [place] CV LAB;  Service: Cardiovascular;  Laterality: N/A;    Allergies: Darvocet; Haldol; Acetaminophen; and Norco  Medications: Prior to Admission medications   Medication Sig Start Date End Date Taking? Authorizing Provider  albuterol (PROVENTIL HFA;VENTOLIN HFA) 108 (90 BASE) MCG/ACT inhaler Inhale 2 puffs into the lungs every 6 (six) hours as needed for  wheezing or shortness of breath. 01/29/15  Yes Arnoldo Morale, MD  albuterol (PROVENTIL) (2.5 MG/3ML) 0.083% nebulizer solution Take 3 mLs (2.5 mg total) by nebulization 2 (two) times daily as needed for wheezing or shortness of breath. 01/16/15  Yes Arnoldo Morale, MD  aspirin 325 MG EC tablet Take 1 tablet (325 mg total) by mouth daily. Patient taking differently: Take 325 mg by mouth every 6 (six) hours as needed for pain.  01/16/15  Yes Arnoldo Morale, MD  aspirin EC 81 MG tablet Take 81 mg by mouth daily.   Yes Historical Provider, MD  atorvastatin (LIPITOR) 40 MG tablet Take 1 tablet (40 mg total) by mouth daily at 6 PM. 01/16/15  Yes Arnoldo Morale, MD  carvedilol (COREG) 6.25 MG tablet Take 1 tablet (6.25 mg total) by mouth 2 (two) times daily with a meal. 01/29/15  Yes Arnoldo Morale, MD  clopidogrel (PLAVIX) 75 MG tablet Take 1 tablet (75 mg total) by mouth daily. 01/29/15  Yes Arnoldo Morale, MD  furosemide (LASIX) 40 MG tablet Take 1 tablet (40 mg total) by mouth 2 (two) times daily. 01/29/15  Yes Arnoldo Morale, MD  mometasone-formoterol (DULERA) 200-5 MCG/ACT AERO Inhale 2 puffs into the lungs 2 (two) times daily. 01/16/15  Yes Arnoldo Morale, MD  omeprazole (PRILOSEC) 20 MG capsule Take 1 capsule (20 mg total) by mouth daily. 01/16/15  Yes Arnoldo Morale, MD  tiotropium (SPIRIVA) 18 MCG inhalation capsule Place 1 capsule (18 mcg total) into inhaler and inhale daily. 01/29/15  Yes Arnoldo Morale, MD  tiZANidine (ZANAFLEX) 4 MG tablet Take 1 tablet (4 mg total) by mouth 3 (three) times daily. Patient taking differently: Take 4 mg by mouth every 8 (eight) hours as needed for muscle spasms.  01/16/15  Yes Arnoldo Morale, MD  Besifloxacin HCl (BESIVANCE) 0.6 % SUSP Place 1 drop into the left eye 2 (two) times daily.    Historical Provider, MD  Difluprednate (DUREZOL) 0.05 % EMUL Place 1 drop into the left eye 2 (two) times daily.    Historical Provider, MD  metoprolol tartrate (LOPRESSOR) 25 MG tablet Take 1 tablet  (25 mg total) by mouth 2 (two) times daily. Patient not taking: Reported on 02/06/2015 01/16/15   Arnoldo Morale, MD  traMADol (ULTRAM) 50 MG tablet Take 1 tablet (50 mg total) by mouth every 8 (eight) hours as needed. Patient not taking: Reported on 02/06/2015 12/28/14   Arnoldo Morale, MD     Family History  Problem Relation Age of Onset  . Heart attack Mother   . Cirrhosis Father   . Heart failure Mother   . Heart failure Brother   . Cancer Brother   . Hypertension Neg Hx     UNKNOWN  . Stroke Neg Hx     UNKNOWN    Social History   Social History  . Marital Status: Single    Spouse Name: N/A  . Number of Children: N/A  . Years of Education: N/A   Social History Main Topics  . Smoking status: Current Every Day Smoker -- 1.00 packs/day for 50 years  Types: Cigars, Cigarettes    Start date: 02/09/1961  . Smokeless tobacco: Former Systems developer    Types: Chew     Comment: 2 ppd, full flavor  . Alcohol Use: 0.6 oz/week    1 Cans of beer per week     Comment: 10/24/14 no drinking for 2 weeks  . Drug Use: No  . Sexual Activity: Yes   Other Topics Concern  . None   Social History Narrative   Recovering alcoholic     Review of Systems: A 12 point ROS discussed and pertinent positives are indicated in the HPI above.  All other systems are negative.  Review of Systems  Constitutional: Positive for activity change. Negative for fever, appetite change and fatigue.  HENT: Negative for tinnitus and trouble swallowing.   Eyes: Negative for visual disturbance.  Respiratory: Negative for cough and shortness of breath.   Gastrointestinal: Negative for nausea and abdominal pain.  Genitourinary: Negative for difficulty urinating.  Musculoskeletal: Negative for back pain.  Neurological: Positive for dizziness, weakness and numbness. Negative for tremors, seizures, syncope, facial asymmetry, speech difficulty, light-headedness and headaches.  Psychiatric/Behavioral: Negative for behavioral  problems and confusion.    Vital Signs: BP 167/76 mmHg  Pulse 91  Temp(Src) 98.6 F (37 C) (Oral)  Resp 15  Ht 6' (1.829 m)  Wt 256 lb (116.121 kg)  BMI 34.71 kg/m2  SpO2 99%  Physical Exam  Constitutional: He is oriented to person, place, and time. He appears well-nourished.  Cardiovascular: Normal rate, regular rhythm and normal heart sounds.   Pulmonary/Chest: Effort normal. He has no wheezes.  Abdominal: Soft. Bowel sounds are normal. There is no tenderness.  Musculoskeletal: Normal range of motion.  Left side some weaker  Neurological: He is alert and oriented to person, place, and time.  Skin: Skin is warm.  Psychiatric: He has a normal mood and affect. His behavior is normal. Judgment and thought content normal.  Nursing note and vitals reviewed.   Mallampati Score:  MD Evaluation Airway: WNL Heart: WNL Abdomen: WNL ASA  Classification: 3 Mallampati/Airway Score: Two  Imaging: Dg Chest 2 View  02/06/2015  CLINICAL DATA:  CVA. EXAM: CHEST  2 VIEW COMPARISON:  08/19/2014 chest radiograph. FINDINGS: Stable cardiomediastinal silhouette with normal heart size. No pneumothorax. No pleural effusion. Clear lungs, with no focal lung consolidation and no pulmonary edema. IMPRESSION: No active cardiopulmonary disease. Electronically Signed   By: Ilona Sorrel M.D.   On: 02/06/2015 21:54   Ct Head Wo Contrast  02/06/2015  CLINICAL DATA:  Left-sided weakness for 2-3 days. History of multiple strokes. EXAM: CT HEAD WITHOUT CONTRAST TECHNIQUE: Contiguous axial images were obtained from the base of the skull through the vertex without intravenous contrast. COMPARISON:  08/27/2014 FINDINGS: Mild chronic small vessel disease throughout the deep white matter. No acute intracranial abnormality. Specifically, no hemorrhage, hydrocephalus, mass lesion, acute infarction, or significant intracranial injury. No acute calvarial abnormality. Opacified left maxillary sinus is stable since prior  study. Postoperative changes in the left anterior maxillary wall and floor the left orbit. Mastoid air cells are clear. IMPRESSION: Chronic small vessel disease throughout the deep white matter. No acute intracranial abnormality. Electronically Signed   By: Rolm Baptise M.D.   On: 02/06/2015 12:42   Mr Brain Wo Contrast  02/06/2015  CLINICAL DATA:  64 year old male with left side weakness beginning 2 days ago. Initial encounter. EXAM: MRI HEAD WITHOUT CONTRAST TECHNIQUE: Multiplanar, multiecho pulse sequences of the brain and surrounding structures were obtained without  intravenous contrast. COMPARISON:  Head CT without contrast 1233 hours today. Brain MRI 08/27/2014. FINDINGS: Chronically abnormal appearance of the right ICA siphon, but with suggestion of reconstituted flow at the right ICA terminus. Overall Major intracranial vascular flow voids are stable since July. Patchy mostly cortically based restricted diffusion in the right superior frontal gyrus involving the MCA territory (some MCA/ACA watershed area in the pre motor region). White matter extension to the right corona radiata on series 4, image 18. No contralateral left hemisphere or posterior fossa restricted diffusion. Associated patchy T2 and FLAIR hyperintensity in the acutely affected areas. No definite associated acute hemorrhage, although there is probably vascular related T2* asymmetry along the medial right hemisphere (series 8, images 16-19) which is new since July. Chronic micro hemorrhage in the right occipital lobe is unchanged. Widespread scattered cerebral white matter T2 and FLAIR hyperintensity elsewhere in both hemispheres, greater on the right, then is stable since July. No midline shift, mass effect, evidence of mass lesion, ventriculomegaly, extra-axial collection or acute intracranial hemorrhage. Cervicomedullary junction and pituitary are within normal limits. Negative visualized cervical spine. Stable paranasal sinuses and  mastoids. Interval postoperative changes to the left globe, otherwise stable orbit and scalp soft tissues. Visualized bone marrow signal is within normal limits. IMPRESSION: 1. 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 2. Chronic poor flow in the right ICA - See also 08/27/2014 Head MRA report. Electronically Signed   By: Genevie Ann M.D.   On: 02/06/2015 16:19    Labs:  CBC:  Recent Labs  08/03/14 0417 08/19/14 1823  08/26/14 1730 08/26/14 1736 12/10/14 0822 02/06/15 1310  WBC 6.7 10.9*  --  7.7  --   --  5.3  HGB 14.8 16.9  < > 15.7 17.0 16.7 15.6  HCT 44.5 50.4  < > 46.2 50.0 49.0 47.2  PLT 127* 166  --  175  --   --  125*  < > = values in this interval not displayed.  COAGS:  Recent Labs  08/01/14 1550 08/19/14 1823 08/26/14 1730 02/07/15 0958  INR 1.00 1.09 1.01 1.08  APTT '28 28 27 28    '$ BMP:  Recent Labs  08/03/14 0417 08/19/14 1823  08/26/14 1730  09/18/14 0954 10/17/14 0956 10/24/14 1046 12/10/14 0822 02/06/15 1310  NA 144 141  < > 135  < > 150* 148* 149* 144 145  K 4.2 4.0  < > 4.0  < > 5.6* 5.9* 6.0* 4.0 4.3  CL 107 105  < > 100*  < > 108 109 108 105 110  CO2 27 23  --  23  --  30 30 32*  --  25  GLUCOSE 113* 117*  < > 133*  < > 82 141* 166* 127* 115*  BUN 12 11  < > 16  < > '12 8 15 19 9  '$ CALCIUM 10.1 10.3  --  9.6  --  10.3 10.1 10.9*  --  10.0  CREATININE 0.69 1.69*  < > 1.36*  < > 1.10 0.97 1.11 0.90 0.92  GFRNONAA >60 41*  --  53*  --   --   --   --   --  >60  GFRAA >60 47*  --  >60  --   --   --   --   --  >60  < > = values in this interval not displayed.  LIVER FUNCTION TESTS:  Recent Labs  08/01/14 1550  08/19/14 1823 08/26/14 1730 02/06/15 1310  BILITOT 0.8 0.8 0.7 0.5  AST 28 33 29 16  ALT 34 33 26 17  ALKPHOS 67 70 69 66  PROT 7.1 7.3 7.4 6.2*  ALBUMIN 3.9 4.1 4.0 3.7    TUMOR MARKERS: No results for input(s): AFPTM, CEA, CA199, CHROMGRNA in the last 8760  hours.  Assessment and Plan:  Hx CVA New L side weakness and numbness: New CVA per imaging R Internal carotid artery stenosis Now scheduled for cerebral arteriogram with possible angioplasty/stent placement 12/30 in IR with Dr Estanislado Pandy Risks and Benefits discussed with the patient including, but not limited to bleeding, infection, vascular injury, contrast induced renal failure, stroke or even death. All of the patient's questions were answered, patient is agreeable to proceed. Consent signed and in chart.  Thank you for this interesting consult.  I greatly enjoyed meeting [name] and look forward to participating in their care.  A copy of this report was sent to the requesting provider on this date.  Signed: Elva Breaker A 02/07/2015, 12:43 PM   I spent a total of 40 Minutes    in face to face in clinical consultation, greater than 50% of which was counseling/coordinating care for cerebral arteriogram with R ICA pta/stent

## 2015-02-07 NOTE — Progress Notes (Signed)
Inpatient Rehabilitation  Patient was screened by Gunnar Fusi for appropriateness for an Inpatient Acute Rehab consult.  At this time we are recommending an Inpatient Rehab consult.  Please order if you are agreeable.    Carmelia Roller., CCC/SLP Admission Coordinator  [facility]  Cell [phone]

## 2015-02-07 NOTE — Progress Notes (Signed)
Occupational Therapy Evaluation Patient Details Name: [name] MRN: [id number] DOB: 1950/04/12 Today's Date: 02/07/2015    History of Present Illness Pt is a 64 y/o male with a PMH of CVA in the right MCA distribution with right MCA stenosis who presents with recurrent strokes in this distribution. He states that 2 nights ago he had left-sided weakness and numbness which then subsequently improved after approximately 3 hours. On 12/27, he had progressive worsening of the symptoms again which did not improve. He subsequently sought care in the ED which shows recurrent infarcts in the watershed distributions of the right MCA territory.   Clinical Impression   Pt admitted with the above diagnoses and presents with below problem list. Pt will benefit from continued acute OT to address the below listed deficits and maximize independence with BADLs prior to d/c to venue below. PTA pt was mod I for most ADLs, min A for bathing. Pt is currently mod A for most ADLs. Session details below. OT to continue to follow acutely.      Follow Up Recommendations  CIR    Equipment Recommendations  Other (comment) (defer)    Recommendations for Other Services Rehab consult     Precautions / Restrictions Precautions Precautions: Fall Restrictions Weight Bearing Restrictions: No      Mobility Bed Mobility Overal bed mobility: Needs Assistance Bed Mobility: Rolling;Sidelying to Sit Rolling: Min assist Sidelying to sit: Mod assist       General bed mobility comments: in recliner  Transfers Overall transfer level: Needs assistance Equipment used: Rolling walker (2 wheeled) Transfers: Sit to/from Stand Sit to Stand: Mod assist         General transfer comment: assist during powerup. Pt able to use LUE minimally as assist.     Balance Overall balance assessment: Needs assistance Sitting-balance support: No upper extremity supported;Feet supported Sitting balance-Leahy Scale:  Fair     Standing balance support: No upper extremity supported;Single extremity supported Standing balance-Leahy Scale: Poor Standing balance comment: right hand on rw, chair follow for safety                            ADL Overall ADL's : Needs assistance/impaired Eating/Feeding: NPO   Grooming: Moderate assistance;Sitting   Upper Body Bathing: Moderate assistance;Sitting   Lower Body Bathing: Moderate assistance;Sit to/from stand   Upper Body Dressing : Moderate assistance;Sitting   Lower Body Dressing: Moderate assistance;Sit to/from stand   Toilet Transfer: Ambulation;BSC;RW;Moderate assistance Toilet Transfer Details (indicate cue type and reason): ambulated about 4 feet on eval with chair follow. Toileting- Clothing Manipulation and Hygiene: Set up;Sitting/lateral lean       Functional mobility during ADLs: Rolling walker;Min guard General ADL Comments: Pt completed sit<>stand from recliner with min-mod A, took  about 4-5 steps min guard.Left side weakness impacting ADLS.      Vision Additional Comments: Pt reporting sensation of "something in my left eye, like a string." With right eye occulded pt reports vision in left eye is blurrier than it usually is. Pt did not have his glasses on eval but reported he felt the difference was not attributable to eyeglasses.   Perception     Praxis      Pertinent Vitals/Pain Pain Assessment: Faces Faces Pain Scale: No hurt     Hand Dominance Right   Extremity/Trunk Assessment Upper Extremity Assessment Upper Extremity Assessment: LUE deficits/detail LUE Deficits / Details: Weakness noted throughout LUE. Stronger distal than proximal. Spasticity  noted at times. When asked to raise arm struggles to move throughout but noted to spontaneously reach across chest with left hand to reposition gown onto right shoulder. Left shoulder flexion consistently impaired on evaluation against gravity. Able to raise arm to about  10 degrees shoulder flexion in gravity-eliminated position. Consistent difficulty achieving full elbow flexion. Pt positioned left hand onto armrest and used as assist to stand. Unable to position left hand onto rw in standing position.  LUE Coordination: decreased fine motor;decreased gross motor   Lower Extremity Assessment Lower Extremity Assessment: Defer to PT evaluation RLE Deficits / Details: General strength is decreased compared to what would be age appropriate. States the R side has always been his "stronger side".  LLE Deficits / Details: Significant strength deficits noted. 2/5 hip flexor, quads, and 3/5 strength in hamstrings in MMT.    Cervical / Trunk Assessment Cervical / Trunk Assessment: Normal (Forward head/rounded shoulders noted.)   Communication Communication Communication: HOH   Cognition Arousal/Alertness: Awake/alert Behavior During Therapy: WFL for tasks assessed/performed Overall Cognitive Status: No family/caregiver present to determine baseline cognitive functioning                     General Comments       Exercises Exercises: Other exercises Other Exercises Other Exercises: Educated on AAROM for LUE elbow and shoulder using right hand under left arm for assist. Pt giving fairly good return demonstration. Reported tightness after about 5 reps. Discussed keeping AAROM exercises gentle and within pain-free zone.   Shoulder Instructions      Home Living Family/patient expects to be discharged to:: Private residence Living Arrangements: Alone Available Help at Discharge: Personal care attendant (2 hours a day M-F) Type of Home: Apartment Home Access: Elevator     Home Layout: One level     Bathroom Shower/Tub: Tub/shower unit;Curtain Shower/tub characteristics: Architectural technologist: Handicapped height Bathroom Accessibility: Yes   Home Equipment: Environmental consultant - 2 wheels;Cane - single point;Grab bars - tub/shower;Shower seat           Prior Functioning/Environment Level of Independence: Needs assistance  Gait / Transfers Assistance Needed: Uses SPC at all times when walking.  ADL's / Homemaking Assistance Needed: Aide assists with housework, meal prep and transportation   Comments: Has been using the RW for ambulation. States he has not gotten in the shower in a very long time. Aide bathes him either sitting on the shower chair (that does not fit in his tub) or on the commode. Pt states that on the weekends he can wash off at the sink but does not get a full body wash. Aide does his grocery shopping and most cooking. States on the weekends he can heat up left overs in the microwave but rarely cooks at the stove.    OT Diagnosis: Disturbance of vision;Cognitive deficits;Hemiplegia non-dominant side   OT Problem List: Decreased strength;Decreased range of motion;Decreased activity tolerance;Impaired balance (sitting and/or standing);Impaired vision/perception;Decreased coordination;Decreased cognition;Decreased safety awareness;Decreased knowledge of use of DME or AE;Decreased knowledge of precautions;Impaired tone;Obesity;Impaired UE functional use   OT Treatment/Interventions: Self-care/ADL training;Therapeutic exercise;Neuromuscular education;DME and/or AE instruction;Therapeutic activities;Cognitive remediation/compensation;Patient/family education;Balance training;Visual/perceptual remediation/compensation    OT Goals(Current goals can be found in the care plan section) Acute Rehab OT Goals Patient Stated Goal: Get stronger OT Goal Formulation: With patient Time For Goal Achievement: 02/21/15 Potential to Achieve Goals: Good ADL Goals Pt Will Perform Grooming: with min assist;sitting Pt Will Perform Upper Body Bathing: with min assist;sitting Pt Will Perform  Lower Body Bathing: with min assist;sit to/from stand;with adaptive equipment Pt Will Perform Upper Body Dressing: with min assist;sitting;with adaptive  equipment Pt Will Perform Lower Body Dressing: with min assist;with adaptive equipment;sit to/from stand Pt Will Transfer to Toilet: with supervision;ambulating;bedside commode Pt Will Perform Toileting - Clothing Manipulation and hygiene: with supervision;sitting/lateral leans Pt Will Perform Tub/Shower Transfer: with min guard assist;ambulating;3 in 1;rolling walker Pt/caregiver will Perform Home Exercise Program: Left upper extremity;Increased ROM;Increased strength;Independently;With written HEP provided  OT Frequency: Min 3X/week   Barriers to D/C:            Co-evaluation              End of Session Equipment Utilized During Treatment: Gait belt;Rolling walker  Activity Tolerance: Patient tolerated treatment well Patient left: in chair;with call bell/phone within reach;with chair alarm set   Time: 1660-6004 OT Time Calculation (min): 31 min Charges:  OT General Charges $OT Visit: 1 Procedure OT Evaluation $Initial OT Evaluation Tier I: 1 Procedure OT Treatments $Self Care/Home Management : 8-22 mins G-Codes:    Hortencia Pilar 02-16-15, 1:25 PM

## 2015-02-07 NOTE — Progress Notes (Signed)
STROKE TEAM PROGRESS NOTE   HISTORY [name] is a 64 y.o. male with a history of previous strokes in the right MCA distribution with right MCA stenosis who presents with recurrent strokes in this distribution. He states that 2 nights ago he had left-sided weakness and numbness which then subsequently improved after approximately 3 hours. Last night, 02/05/2015, he had progressive worsening of the symptoms again which did not improve. He subsequently sought care in the emergency department which shows recurrent infarcts in the watershed distributions of the right MCA territory. He was LKW 02/04/2015, time unknown. Patient was not administered TPA secondary to being out of window. He was admitted for further evaluation and treatment.   SUBJECTIVE (INTERVAL HISTORY) His PCA is at the bedside.  Overall he feels his condition is stable. Still has left sided weakness. He still not quit smoking yet. PCA stated that his BP at home is good but do not know the number. Will schedule for angio and stenting tomorrow.   OBJECTIVE Temp:  [97.8 F (36.6 C)-98.7 F (37.1 C)] 98.6 F (37 C) (12/29 0630) Pulse Rate:  [69-92] 91 (12/29 0630) Cardiac Rhythm:  [-] Normal sinus rhythm (12/28 1900) Resp:  [15-32] 15 (12/29 0630) BP: (144-186)/(65-102) 167/76 mmHg (12/29 0630) SpO2:  [88 %-99 %] 98 % (12/29 0630) Weight:  [116.121 kg (256 lb)] 116.121 kg (256 lb) (12/29 0005)  CBC:  Recent Labs Lab 02/06/15 1310  WBC 5.3  NEUTROABS 2.9  HGB 15.6  HCT 47.2  MCV 93.8  PLT 125*    Basic Metabolic Panel:  Recent Labs Lab 02/06/15 1310  NA 145  K 4.3  CL 110  CO2 25  GLUCOSE 115*  BUN 9  CREATININE 0.92  CALCIUM 10.0    Lipid Panel:    Component Value Date/Time   CHOL 231* 02/06/2015 1830   TRIG 172* 02/06/2015 1830   HDL 47 02/06/2015 1830   CHOLHDL 4.9 02/06/2015 1830   VLDL 34 02/06/2015 1830   LDLCALC 150* 02/06/2015 1830   HgbA1c:  Lab Results  Component Value Date    HGBA1C 6.7* 02/06/2015   Urine Drug Screen:    Component Value Date/Time   LABOPIA POSITIVE* 08/27/2014 1630   COCAINSCRNUR POSITIVE* 08/27/2014 1630   LABBENZ POSITIVE* 08/27/2014 1630   AMPHETMU NONE DETECTED 08/27/2014 1630   THCU NONE DETECTED 08/27/2014 1630   LABBARB NONE DETECTED 08/27/2014 1630      IMAGING I have personally reviewed the radiological images below and agree with the radiology interpretations.  Dg Chest 2 View 02/06/2015   No active cardiopulmonary disease.   Ct Head Wo Contrast 02/06/2015   Chronic small vessel disease throughout the deep white matter. No acute intracranial abnormality.   Mr Brain Wo Contrast 02/06/2015   1. 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 2. Chronic poor flow in the right ICA   TTE 07/2014 - - Left ventricle: The cavity size was normal. There was mild concentric hypertrophy. Systolic function was normal. The estimated ejection fraction was in the range of 50% to 55%. Wall motion was normal; there were no regional wall motion abnormalities. Doppler parameters are consistent with abnormal left ventricular relaxation (grade 1 diastolic dysfunction). - Left atrium: The atrium was moderately dilated.  Cerebral angio - pending  PHYSICAL EXAM  Temp:  [97.8 F (36.6 C)-98.7 F (37.1 C)] 98.6 F (37 C) (12/29 0630) Pulse Rate:  [78-92] 91 (12/29 0630) Resp:  [15-18]  15 (12/29 0630) BP: (144-179)/(65-101) 167/76 mmHg (12/29 0630) SpO2:  [88 %-99 %] 99 % (12/29 1104) Weight:  [256 lb (116.121 kg)] 256 lb (116.121 kg) (12/29 0005)  General - Well nourished, well developed, in no apparent distress.  Ophthalmologic - Fundi not visualized due to eye movement.  Cardiovascular - Regular rate and rhythm with no murmur.  Mental Status -  Level of arousal and orientation to time, place, and person were intact. Language including expression, naming, repetition,  comprehension was assessed and found intact, but moderate to severe dysarthria. Fund of Knowledge was assessed and was impaired.  Cranial Nerves II - XII - II - Visual field intact OU. III, IV, VI - Extraocular movements intact. V - Facial sensation intact bilaterally. VII - left facial droop. VIII - Hearing & vestibular intact bilaterally. X - Palate elevates symmetrically, moderate to severe dysarthria. XI - Chin turning & shoulder shrug intact bilaterally. XII - Tongue protrusion intact.  Motor Strength - The patient's strength was LUE proximal 3-/5 and bicept and tricep 2/5, hand grip 3/5, LLE proximal 2/5 and distal 3/5. RUE and RLE 5/5. Bulk was normal and fasciculations were absent.   Motor Tone - Muscle tone was assessed at the neck and appendages and was normal.  Reflexes - The patient's reflexes were 1+ in all extremities and he had no pathological reflexes.  Sensory - Light touch, temperature/pinprick were assessed and were symmetrical.    Coordination - The patient had normal movements in the right hand with no ataxia or dysmetria.  Tremor was absent.  Gait and Station - not tested due to weakness.   ASSESSMENT/PLAN [name] is a 64 y.o. male with history of previous R brain strokes presenting with left sided weakness. He did not receive IV t-PA due to delay in arrival.   Stroke:  Recurrent scattered right MCA and MCA/ACA watershed infarcts felt to be secondary to known R ICA supraclinoid near occlusion. Hx of stroke all in the right anterior circulation due to right ICA stenosis.   MRI  scattered right MCA and MCA/ACA watershed infarcts   Cerebral angio with R ICA stent scheduled with Dr. Estanislado Pandy for tomorrow. Order written  LDL 150  HgbA1c 6.7  SCDs for VTE prophylaxis  Diet NPO time specified Except for: Sips with Meds  aspirin 325 mg daily and clopidogrel 75 mg daily prior to admission, now on aspirin 325 mg daily and clopidogrel 75 mg  daily  Ongoing aggressive stroke risk factor management  Therapy recommendations:  CIR  Disposition:  pending   Hx stroke/TIA   August 01, 2014  right frontal lobe watershed infarcts with hypotension in setting of R paraclinoid ICA stenosis   August 19, 2014  right posterior frontal infarct secondary to hypotension in setting of severe R paraclinoid stenosis  August 26, 2014  right corpus callosum infarcts in setting of R frontal infarcts in June, and new right frontal at early July, felt to be watershed in setting of hypotension due to R ICA supraclinoid near occlusion, though could not rule out embolic source.  PVD  Follow up with Dr. Scot Dock as outpt  Planning for redo right femoral to below knee popliteal artery bypass  Asked PCA to contact Dr. Scot Dock to see if he would like to do it this admission  Hypertension  On the high side  Permissive hypertension (OK if < 220/120) but gradually normalize in 5-7 days  May need strict control < 160 after recannulization  Hyperlipidemia  Home meds:  lipitor 40, resumed in hospital  LDL 150, goal < 70  Increase lipitor to '80mg'$   Continue statin at discharge  Tobacco abuse  Current smoker  Smoking cessation counseling provided  Pt is not firm on quiting  No nicotin patch provided as pt likely continue to smoke after discharge  Other Stroke Risk Factors  Advanced age  Marijuana use  ETOH use  Hx cocaine use w/ benzo and opiates, UDS positive in July 2016. Have ordered UDS this admission  Obesity, Body mass index is 34.71 kg/(m^2).   Coronary artery disease - MI  Obstructive sleep apnea, on CPAP at home  Hx CHF  Hx of DVT  Hx DVT, PVL in 02/2014 significant for RLE DVT involving the popliteal vein, treated with xarelto, noncompliant - had stopped d/t rectal bleeding prior to last admission, never resumed, though instructed. Refused colonoscopy in hospital.  As per note, his 02/2014 DVT is the second  DVT  However, pt is not compliance with [facility] and stated that he had GIB in the past with [facility].   In July 2016, Due to noncompliance with medication, illicit drug use and GIB on [facility], did not recommend to resume Xarelto   hypercoagulable panel / vasculitic labs completed in the past  Neg for DVT during previous admission  Other Active Problems  [facility] day # 1  Rosalin Hawking, MD PhD Stroke Neurology 02/07/2015 5:58 PM      To contact Stroke Continuity provider, please refer to [email or web address] After hours, contact General Neurology

## 2015-02-07 NOTE — Progress Notes (Signed)
Pt refuses CPAP. States he tried at home years ago and Dr. Could never get settings correct so he returned machine.

## 2015-02-08 ENCOUNTER — Encounter (HOSPITAL_COMMUNITY)
Admission: EM | Disposition: A | Discharge status: Skilled Nursing Facility | Payer: Self-pay | Source: Home / Self Care | Attending: Internal Medicine

## 2015-02-08 ENCOUNTER — Inpatient Hospital Stay (HOSPITAL_COMMUNITY): Payer: Medicare Other | Admitting: Anesthesiology

## 2015-02-08 ENCOUNTER — Encounter (HOSPITAL_COMMUNITY): Payer: Self-pay | Admitting: Certified Registered"

## 2015-02-08 ENCOUNTER — Inpatient Hospital Stay (HOSPITAL_COMMUNITY): Payer: Medicare Other

## 2015-02-08 DIAGNOSIS — Z720 Tobacco use: Secondary | ICD-10-CM

## 2015-02-08 DIAGNOSIS — D696 Thrombocytopenia, unspecified: Secondary | ICD-10-CM

## 2015-02-08 DIAGNOSIS — J42 Unspecified chronic bronchitis: Secondary | ICD-10-CM

## 2015-02-08 DIAGNOSIS — Z8673 Personal history of transient ischemic attack (TIA), and cerebral infarction without residual deficits: Secondary | ICD-10-CM

## 2015-02-08 DIAGNOSIS — I10 Essential (primary) hypertension: Secondary | ICD-10-CM

## 2015-02-08 DIAGNOSIS — R0682 Tachypnea, not elsewhere classified: Secondary | ICD-10-CM

## 2015-02-08 DIAGNOSIS — I2510 Atherosclerotic heart disease of native coronary artery without angina pectoris: Secondary | ICD-10-CM | POA: Insufficient documentation

## 2015-02-08 DIAGNOSIS — I63231 Cerebral infarction due to unspecified occlusion or stenosis of right carotid arteries: Secondary | ICD-10-CM

## 2015-02-08 DIAGNOSIS — I739 Peripheral vascular disease, unspecified: Secondary | ICD-10-CM

## 2015-02-08 HISTORY — PX: RADIOLOGY WITH ANESTHESIA: SHX6223

## 2015-02-08 LAB — CBC WITH DIFFERENTIAL/PLATELET
BASOS ABS: 0 10*3/uL (ref 0.0–0.1)
BASOS ABS: 0 10*3/uL (ref 0.0–0.1)
Basophils Relative: 1 %
Basophils Relative: 0 %
EOS ABS: 0.1 10*3/uL (ref 0.0–0.7)
EOS PCT: 5 %
EOS PCT: 2 %
Eosinophils Absolute: 0.3 10*3/uL (ref 0.0–0.7)
HCT: 45.5 % (ref 39.0–52.0)
HCT: 41.3 % (ref 39.0–52.0)
HEMOGLOBIN: 13 g/dL (ref 13.0–17.0)
Hemoglobin: 14.8 g/dL (ref 13.0–17.0)
LYMPHS PCT: 26 %
LYMPHS PCT: 19 %
Lymphs Abs: 1.4 10*3/uL (ref 0.7–4.0)
Lymphs Abs: 1.1 10*3/uL (ref 0.7–4.0)
MCH: 30.8 pg (ref 26.0–34.0)
MCH: 29.7 pg (ref 26.0–34.0)
MCHC: 32.5 g/dL (ref 30.0–36.0)
MCHC: 31.5 g/dL (ref 30.0–36.0)
MCV: 94.6 fL (ref 78.0–100.0)
MCV: 94.5 fL (ref 78.0–100.0)
Monocytes Absolute: 0.7 10*3/uL (ref 0.1–1.0)
Monocytes Absolute: 0.6 10*3/uL (ref 0.1–1.0)
Monocytes Relative: 14 %
Monocytes Relative: 10 %
NEUTROS ABS: 3 10*3/uL (ref 1.7–7.7)
NEUTROS PCT: 55 %
NEUTROS PCT: 69 %
Neutro Abs: 3.9 10*3/uL (ref 1.7–7.7)
PLATELETS: 114 10*3/uL — AB (ref 150–400)
PLATELETS: 106 10*3/uL — AB (ref 150–400)
RBC: 4.81 MIL/uL (ref 4.22–5.81)
RBC: 4.37 MIL/uL (ref 4.22–5.81)
RDW: 14.3 % (ref 11.5–15.5)
RDW: 14.4 % (ref 11.5–15.5)
WBC: 5.4 10*3/uL (ref 4.0–10.5)
WBC: 5.7 10*3/uL (ref 4.0–10.5)

## 2015-02-08 LAB — COMPREHENSIVE METABOLIC PANEL
ALT: 13 U/L — AB (ref 17–63)
ANION GAP: 7 (ref 5–15)
AST: 13 U/L — ABNORMAL LOW (ref 15–41)
Albumin: 3.4 g/dL — ABNORMAL LOW (ref 3.5–5.0)
Alkaline Phosphatase: 63 U/L (ref 38–126)
BUN: 8 mg/dL (ref 6–20)
CHLORIDE: 109 mmol/L (ref 101–111)
CO2: 28 mmol/L (ref 22–32)
CREATININE: 0.82 mg/dL (ref 0.61–1.24)
Calcium: 9.6 mg/dL (ref 8.9–10.3)
GFR calc non Af Amer: >60 mL/min (ref >60)
Glucose, Bld: 124 mg/dL — ABNORMAL HIGH (ref 65–99)
Potassium: 4 mmol/L (ref 3.5–5.1)
SODIUM: 144 mmol/L (ref 135–145)
Total Bilirubin: 0.7 mg/dL (ref 0.3–1.2)
Total Protein: 6 g/dL — ABNORMAL LOW (ref 6.5–8.1)

## 2015-02-08 LAB — GLUCOSE, CAPILLARY: Glucose-Capillary: 118 mg/dL — ABNORMAL HIGH (ref 65–99)

## 2015-02-08 LAB — POCT ACTIVATED CLOTTING TIME
ACTIVATED CLOTTING TIME: 157 s
ACTIVATED CLOTTING TIME: 168 s
Activated Clotting Time: 157 seconds

## 2015-02-08 LAB — PROTIME-INR
INR: 1.07 (ref 0.00–1.49)
Prothrombin Time: 14.1 seconds (ref 11.6–15.2)

## 2015-02-08 LAB — HEPARIN LEVEL (UNFRACTIONATED): Heparin Unfractionated: <0.1 IU/mL — ABNORMAL LOW (ref 0.30–0.70)

## 2015-02-08 LAB — APTT: APTT: 28 s (ref 24–37)

## 2015-02-08 SURGERY — RADIOLOGY WITH ANESTHESIA
Anesthesia: Monitor Anesthesia Care

## 2015-02-08 MED ORDER — SUCCINYLCHOLINE CHLORIDE 20 MG/ML IJ SOLN
INTRAMUSCULAR | Status: DC | PRN
  Administered 2015-02-08: 100 mg via INTRAVENOUS

## 2015-02-08 MED ORDER — PHENYLEPHRINE HCL 10 MG/ML IJ SOLN
10.0000 mg | INTRAVENOUS | Status: DC | PRN
  Administered 2015-02-08: 40 ug/min via INTRAVENOUS
  Administered 2015-02-08: 60 ug/min via INTRAVENOUS
  Administered 2015-02-08: 100 ug/min via INTRAVENOUS

## 2015-02-08 MED ORDER — PROPOFOL 10 MG/ML IV BOLUS
INTRAVENOUS | Status: DC | PRN
  Administered 2015-02-08: 30 mg via INTRAVENOUS
  Administered 2015-02-08: 140 mg via INTRAVENOUS

## 2015-02-08 MED ORDER — LIDOCAINE HCL (CARDIAC) 20 MG/ML IV SOLN
INTRAVENOUS | Status: DC | PRN
  Administered 2015-02-08: 60 mg via INTRAVENOUS

## 2015-02-08 MED ORDER — IOHEXOL 300 MG/ML  SOLN
325.0000 mL | Freq: Once | INTRAMUSCULAR | Status: AC | PRN
Start: 2015-02-08 — End: 2015-02-08
  Administered 2015-02-08: 150 mL via INTRA_ARTERIAL

## 2015-02-08 MED ORDER — HYDRALAZINE HCL 20 MG/ML IJ SOLN
INTRAMUSCULAR | Status: DC | PRN
  Administered 2015-02-08 (×3): 2 mg via INTRAVENOUS
  Administered 2015-02-08: 5 mg via INTRAVENOUS

## 2015-02-08 MED ORDER — EPTIFIBATIDE 20 MG/10ML IV SOLN
20.0000 mg | Freq: Once | INTRAVENOUS | Status: AC
  Administered 2015-02-08 (×2): 2 mg
  Administered 2015-02-08: 1 mg
  Administered 2015-02-08 (×4): 2 mg
  Filled 2015-02-08: qty 10

## 2015-02-08 MED ORDER — VECURONIUM BROMIDE 10 MG IV SOLR
INTRAVENOUS | Status: DC | PRN
  Administered 2015-02-08 (×6): 1 mg via INTRAVENOUS
  Administered 2015-02-08: 2 mg via INTRAVENOUS
  Administered 2015-02-08 (×2): 1 mg via INTRAVENOUS

## 2015-02-08 MED ORDER — ASPIRIN 325 MG PO TABS
325.0000 mg | ORAL_TABLET | Freq: Every day | ORAL | Status: DC
  Administered 2015-02-09 – 2015-02-13 (×5): 325 mg via ORAL
  Filled 2015-02-08 (×5): qty 1

## 2015-02-08 MED ORDER — ONDANSETRON HCL 4 MG/2ML IJ SOLN
4.0000 mg | Freq: Four times a day (QID) | INTRAMUSCULAR | Status: DC | PRN
  Administered 2015-02-11: 4 mg via INTRAVENOUS
  Filled 2015-02-08: qty 2

## 2015-02-08 MED ORDER — NEOSTIGMINE METHYLSULFATE 10 MG/10ML IV SOLN
INTRAVENOUS | Status: DC | PRN
  Administered 2015-02-08: 3 mg via INTRAVENOUS

## 2015-02-08 MED ORDER — ONDANSETRON HCL 4 MG/2ML IJ SOLN: 4.0000 mg | Freq: Once | INTRAMUSCULAR | Status: DC | PRN

## 2015-02-08 MED ORDER — HEPARIN (PORCINE) IN NACL 100-0.45 UNIT/ML-% IJ SOLN
1000.0000 [IU]/h | INTRAMUSCULAR | Status: DC
  Filled 2015-02-08: qty 250

## 2015-02-08 MED ORDER — MEPERIDINE HCL 25 MG/ML IJ SOLN: 6.2500 mg | INTRAMUSCULAR | Status: DC | PRN

## 2015-02-08 MED ORDER — FENTANYL CITRATE (PF) 100 MCG/2ML IJ SOLN
INTRAMUSCULAR | Status: DC | PRN
  Administered 2015-02-08: 125 ug via INTRAVENOUS

## 2015-02-08 MED ORDER — GLYCOPYRROLATE 0.2 MG/ML IJ SOLN
INTRAMUSCULAR | Status: DC | PRN
  Administered 2015-02-08: 0.4 mg via INTRAVENOUS

## 2015-02-08 MED ORDER — CLOPIDOGREL BISULFATE 75 MG PO TABS
75.0000 mg | ORAL_TABLET | Freq: Every day | ORAL | Status: DC
  Administered 2015-02-09 – 2015-02-13 (×5): 75 mg via ORAL
  Filled 2015-02-08 (×5): qty 1

## 2015-02-08 MED ORDER — ACETAMINOPHEN 500 MG PO TABS: 1000.0000 mg | ORAL_TABLET | Freq: Four times a day (QID) | ORAL | Status: DC | PRN

## 2015-02-08 MED ORDER — SODIUM CHLORIDE 0.9 % IV SOLN
INTRAVENOUS | Status: DC
  Administered 2015-02-08: 15:00:00 via INTRAVENOUS
  Administered 2015-02-09: 1000 mL via INTRAVENOUS

## 2015-02-08 MED ORDER — MIDAZOLAM HCL 5 MG/5ML IJ SOLN
INTRAMUSCULAR | Status: DC | PRN
  Administered 2015-02-08: 2 mg via INTRAVENOUS

## 2015-02-08 MED ORDER — LABETALOL HCL 5 MG/ML IV SOLN
INTRAVENOUS | Status: DC | PRN
  Administered 2015-02-08: 10 mg via INTRAVENOUS
  Administered 2015-02-08 (×4): 2.5 mg via INTRAVENOUS

## 2015-02-08 MED ORDER — ACETAMINOPHEN 650 MG RE SUPP: 650.0000 mg | Freq: Four times a day (QID) | RECTAL | Status: DC | PRN

## 2015-02-08 MED ORDER — HEPARIN (PORCINE) IN NACL 100-0.45 UNIT/ML-% IJ SOLN
500.0000 [IU]/h | INTRAMUSCULAR | Status: DC
  Administered 2015-02-08: 500 [IU]/h via INTRAVENOUS
  Filled 2015-02-08: qty 250

## 2015-02-08 MED ORDER — NITROGLYCERIN 1 MG/10 ML FOR IR/CATH LAB
INTRA_ARTERIAL | Status: AC
  Filled 2015-02-08: qty 10

## 2015-02-08 MED ORDER — ROCURONIUM BROMIDE 100 MG/10ML IV SOLN
INTRAVENOUS | Status: DC | PRN
  Administered 2015-02-08: 40 mg via INTRAVENOUS
  Administered 2015-02-08: 10 mg via INTRAVENOUS

## 2015-02-08 MED ORDER — FENTANYL CITRATE (PF) 100 MCG/2ML IJ SOLN: 25.0000 ug | INTRAMUSCULAR | Status: DC | PRN

## 2015-02-08 MED ORDER — NICARDIPINE HCL IN NACL 20-0.86 MG/200ML-% IV SOLN
5.0000 mg/h | INTRAVENOUS | Status: DC
  Administered 2015-02-09 (×2): 5 mg/h via INTRAVENOUS
  Filled 2015-02-08 (×3): qty 200

## 2015-02-08 MED ORDER — ONDANSETRON HCL 4 MG/2ML IJ SOLN
INTRAMUSCULAR | Status: DC | PRN
  Administered 2015-02-08: 4 mg via INTRAVENOUS

## 2015-02-08 MED ORDER — HEPARIN (PORCINE) IN NACL 100-0.45 UNIT/ML-% IJ SOLN
INTRAMUSCULAR | Status: AC
  Filled 2015-02-08: qty 250

## 2015-02-08 MED ORDER — LACTATED RINGERS IV SOLN
INTRAVENOUS | Status: DC
  Administered 2015-02-08 (×2): via INTRAVENOUS

## 2015-02-08 MED ORDER — HEPARIN SODIUM (PORCINE) 1000 UNIT/ML IJ SOLN
INTRAMUSCULAR | Status: DC | PRN
  Administered 2015-02-08: 3000 [IU] via INTRAVENOUS
  Administered 2015-02-08 (×6): 500 [IU] via INTRAVENOUS

## 2015-02-08 MED ORDER — LIDOCAINE HCL 1 % IJ SOLN
INTRAMUSCULAR | Status: AC
  Filled 2015-02-08: qty 20

## 2015-02-08 NOTE — Anesthesia Preprocedure Evaluation (Addendum)
Anesthesia Evaluation  Patient identified by MRN, date of birth, ID band Patient awake    Reviewed: Allergy & Precautions, NPO status , Patient's Chart, lab work & pertinent test results, reviewed documented beta blocker date and time   Airway Mallampati: III  TM Distance: >3 FB Neck ROM: Full    Dental  (+) Edentulous Lower, Edentulous Upper   Pulmonary shortness of breath and with exertion, asthma , sleep apnea and Continuous Positive Airway Pressure Ventilation , pneumonia, COPD,  COPD inhaler, Current Smoker,    Pulmonary exam normal breath sounds clear to auscultation       Cardiovascular hypertension, Pt. on medications and Pt. on home beta blockers + CAD, + Past MI, + Peripheral Vascular Disease, +CHF and + DOE  + dysrhythmias Atrial Fibrillation  Rhythm:Irregular Rate:Normal     Neuro/Psych  Headaches, PSYCHIATRIC DISORDERS Anxiety Depression Recent CVA- embolic with left hemiparesis CVA    GI/Hepatic GERD  Medicated and Controlled,(+)     substance abuse  alcohol use and cocaine use,   Endo/Other  Obesity Hyperlipidemia  Renal/GU Renal InsufficiencyRenal disease  negative genitourinary   Musculoskeletal  (+) Arthritis , Osteoarthritis,    Abdominal (+) + obese,   Peds  Hematology On plavix- last dose Thrombocytopenia   Anesthesia Other Findings   Reproductive/Obstetrics                          Anesthesia Physical Anesthesia Plan  ASA: IV  Anesthesia Plan: MAC and General   Post-op Pain Management:    Induction: Intravenous  Airway Management Planned: Natural Airway and Oral ETT  Additional Equipment:   Intra-op Plan:   Post-operative Plan: Extubation in OR  Informed Consent: I have reviewed the patients History and Physical, chart, labs and discussed the procedure including the risks, benefits and alternatives for the proposed anesthesia with the patient or authorized  representative who has indicated his/her understanding and acceptance.   Dental advisory given  Plan Discussed with: CRNA, Anesthesiologist and Surgeon  Anesthesia Plan Comments:        Anesthesia Quick Evaluation

## 2015-02-08 NOTE — Care Management Important Message (Signed)
Important Message  Patient Details  Name: [name] MRN: [id number] Date of Birth: 01/18/1951   Medicare Important Message Given:  Yes    Raeden Schippers P [facility] 02/08/2015, 2:21 PM

## 2015-02-08 NOTE — Progress Notes (Signed)
PT Cancellation Note  Patient Details Name: EZIO WIECK MRN: [id number] DOB: 1950/08/03   Cancelled Treatment:    Reason Eval/Treat Not Completed: Patient at procedure or test/unavailable. Will attempt to see later today, if medically appropriate.    Lynniah Janoski 02/08/2015, 8:19 AM  Pager [phone]

## 2015-02-08 NOTE — Progress Notes (Signed)
Patient refused cpap for tonight 

## 2015-02-08 NOTE — Anesthesia Procedure Notes (Signed)
Procedure Name: Intubation Date/Time: 02/08/2015 9:32 AM Performed by: Melina Copa, Mardel Grudzien R Pre-anesthesia Checklist: Patient identified, Emergency Drugs available, Suction available, Patient being monitored and Timeout performed Patient Re-evaluated:Patient Re-evaluated prior to inductionOxygen Delivery Method: Circle system utilized Preoxygenation: Pre-oxygenation with 100% oxygen Intubation Type: IV induction Ventilation: Mask ventilation with difficulty Laryngoscope Size: Mac and 4 Grade View: Grade III Tube type: Oral Tube size: 8.0 mm Number of attempts: 1 Airway Equipment and Method: Stylet Placement Confirmation: ETT inserted through vocal cords under direct vision,  positive ETCO2 and breath sounds checked- equal and bilateral Secured at: 23 cm Tube secured with: Tape Dental Injury: Teeth and Oropharynx as per pre-operative assessment

## 2015-02-08 NOTE — Transfer of Care (Signed)
Immediate Anesthesia Transfer of Care Note  Patient: [name]  Procedure(s) Performed: Procedure(s): RADIOLOGY WITH ANESTHESIA (N/A)  Patient Location: PACU  Anesthesia Type:General  Level of Consciousness: awake, oriented and patient cooperative  Airway & Oxygen Therapy: Patient Spontanous Breathing and Patient connected to face mask oxygen  Post-op Assessment: Report given to RN, Post -op Vital signs reviewed and stable and Patient moving all extremities  Post vital signs: Reviewed and stable  Last Vitals:  Filed Vitals:   02/08/15 0508 02/08/15 1300  BP: 146/72   Pulse: 80   Temp: 36.5 C 36.6 C  Resp: 20     Complications: No apparent anesthesia complications

## 2015-02-08 NOTE — Progress Notes (Signed)
Pt transported to OR for procedure at this time with no noted distress. Pt alert, verbal denying pain or discomfort. Telemetry on standby until he returns. Report called in to nurse [facility]. Am medications sent with pt to be administered per request by nurse.

## 2015-02-08 NOTE — Progress Notes (Signed)
PATIENT DETAILS Name: [name] Age: 64 y.o. Sex: male Date of Birth: Apr 13, 1950 Admit Date: 02/06/2015 Admitting Physician Waldemar Dickens, MD [id number], [name], MD  Subjective: Left-sided weakness essentially unchanged.  Assessment/Plan: Principal Problem: Recurrent right MCA territory infarct: Secondary to known right ICA supraclinoid near occlusion. Has very mild left sided weakness-around 4/5-slightly improved compared to yesterday. Seen by neurology, recommendations were for cerebral angiogram and stenting.Interventional radiology was consulted, patient underwent catheter guided cerebral angiogram with stenting of RT ICA supraclinoid segment on 12/30. Continue aspirin/Plavix and heparin infusion. Monitor in [facility] post op orders  Active Problems: Hypertension: Moderately Controlled, although permissive hypertension, resume antihypertensives over the next few days.  Dyslipidemia: Continue Lipitor, LDL- 150 (goal<70)  COPD: Lungs clear, continue current bronchodilator regimen.  History of CAD: No chest pain or shortness of breath, continue antiplatelets, statin, resume beta blockers in the next few days. Most recent nuclear stress test in October 2016 negative for ischemia.  History of peripheral vascular disease: Followed by vascular surgery-plans are to redo right femoral to below knee popliteal bypass-tentatively scheduled for 02/19/2015.  History of DVT: Previously on xarelto-this was discontinued because of noncompliance  GERD:PPI  Polysubstance abuse: Previous urinary drug screen is positive for cocaine/benzo/opiates. Urine drug screen pending this admission.  TKW:IOXB qhs  Disposition: Remain inpatient-?CIR on discharge  Antimicrobial agents  See below  Anti-infectives    Start     Dose/Rate Route Frequency Ordered Stop   02/08/15 0845  ceFAZolin (ANCEF) IVPB 2 g/50 mL premix     2 g 100 mL/hr over 30 Minutes Intravenous To  Radiology 02/07/15 1244 02/08/15 0913      DVT Prophylaxis:  SCD's  Code Status: Full code   Family Communication None at bedside  Procedures: None  CONSULTS:  neurology and IR  Time spent 30 minutes-Greater than 50% of this time was spent in counseling, explanation of diagnosis, planning of further management, and coordination of care.  MEDICATIONS: Scheduled Meds: . [START ON 02/09/2015] aspirin  325 mg Oral Q breakfast  . atorvastatin  80 mg Oral q1800  . [START ON 02/09/2015] clopidogrel  75 mg Oral Q breakfast  . folic acid  1 mg Oral Daily  . gatifloxacin  1 drop Left Eye QID  . heparin      . mometasone-formoterol  2 puff Inhalation BID  . multivitamin with minerals  1 tablet Oral Daily  . nitroGLYCERIN      . pantoprazole  40 mg Oral Daily  . thiamine  100 mg Oral Daily   Or  . thiamine  100 mg Intravenous Daily  . tiotropium  18 mcg Inhalation Daily   Continuous Infusions: . sodium chloride 75 mL/hr at 02/08/15 1450  . heparin 800 Units/hr (02/08/15 1500)  . lactated ringers 10 mL/hr at 02/08/15 0819  . niCARDipine     PRN Meds:.acetaminophen **OR** acetaminophen, albuterol, diclofenac sodium, LORazepam **OR** LORazepam, methocarbamol, ondansetron (ZOFRAN) IV, senna-docusate, tiZANidine, traMADol    PHYSICAL EXAM: Vital signs in last 24 hours: Filed Vitals:   02/08/15 1420 02/08/15 1430 02/08/15 1445 02/08/15 1500  BP: 98/60 115/66 108/69 121/66  Pulse: 74 73 78 74  Temp: 97.8 F (36.6 C)     TempSrc: Oral     Resp: '24 25 23 24  '$ Height:    6' (1.829 m)  Weight:    114.4 kg (252 lb 3.3 oz)  SpO2: 97% 98%  99% 100%    Weight change:  Filed Weights   02/07/15 0005 02/08/15 1500  Weight: 116.121 kg (256 lb) 114.4 kg (252 lb 3.3 oz)   Body mass index is 34.2 kg/(m^2).   Gen Exam: Awake and alert with clear speech.   Neck: Supple, No JVD.   Chest: B/L Clear.   CVS: S1 S2 Regular, no murmurs.  Abdomen: soft, BS +, non tender, non distended.    Extremities: no edema, lower extremities warm to touch. Neurologic: Left sided weakness-4/5   Skin: No Rash.   Wounds: N/A.    Intake/Output from previous day:  Intake/Output Summary (Last 24 hours) at 02/08/15 1532 Last data filed at 02/08/15 1328  Gross per 24 hour  Intake   1600 ml  Output    950 ml  Net    650 ml     LAB RESULTS: CBC  Recent Labs Lab 02/06/15 1310 02/08/15 0551  WBC 5.3 5.4  HGB 15.6 14.8  HCT 47.2 45.5  PLT 125* 114*  MCV 93.8 94.6  MCH 31.0 30.8  MCHC 33.1 32.5  RDW 14.2 14.3  LYMPHSABS 1.5 1.4  MONOABS 0.7 0.7  EOSABS 0.2 0.3  BASOSABS 0.0 0.0    Chemistries   Recent Labs Lab 02/06/15 1310 02/08/15 0551  NA 145 144  K 4.3 4.0  CL 110 109  CO2 25 28  GLUCOSE 115* 124*  BUN 9 8  CREATININE 0.92 0.82  CALCIUM 10.0 9.6    CBG:  Recent Labs Lab 02/08/15 0820  GLUCAP 118*    GFR Estimated Creatinine Clearance: 117.3 mL/min (by C-G formula based on Cr of 0.82).  Coagulation profile  Recent Labs Lab 02/07/15 0958 02/08/15 0551  INR 1.08 1.07    Cardiac Enzymes No results for input(s): CKMB, TROPONINI, MYOGLOBIN in the last 168 hours.  Invalid input(s): CK  Invalid input(s): POCBNP No results for input(s): DDIMER in the last 72 hours.  Recent Labs  02/06/15 1830  HGBA1C 6.7*    Recent Labs  02/06/15 1830  CHOL 231*  HDL 47  LDLCALC 150*  TRIG 172*  CHOLHDL 4.9   No results for input(s): TSH, T4TOTAL, T3FREE, THYROIDAB in the last 72 hours.  Invalid input(s): FREET3 No results for input(s): VITAMINB12, FOLATE, FERRITIN, TIBC, IRON, RETICCTPCT in the last 72 hours. No results for input(s): LIPASE, AMYLASE in the last 72 hours.  Urine Studies No results for input(s): UHGB, CRYS in the last 72 hours.  Invalid input(s): UACOL, UAPR, USPG, UPH, UTP, UGL, UKET, UBIL, UNIT, UROB, ULEU, UEPI, UWBC, URBC, UBAC, CAST, UCOM, BILUA  MICROBIOLOGY: Recent Results (from the past 240 hour(s))  Surgical pcr  screen     Status: None   Collection Time: 02/07/15  1:03 AM  Result Value Ref Range Status   MRSA, PCR NEGATIVE NEGATIVE Final   Staphylococcus aureus NEGATIVE NEGATIVE Final    Comment:        The Xpert SA Assay (FDA approved for NASAL specimens in patients over 70 years of age), is one component of a comprehensive surveillance program.  Test performance has been validated by [facility] for patients greater than or equal to 38 year old. It is not intended to diagnose infection nor to guide or monitor treatment.     RADIOLOGY STUDIES/RESULTS: Dg Chest 2 View  02/06/2015  CLINICAL DATA:  CVA. EXAM: CHEST  2 VIEW COMPARISON:  08/19/2014 chest radiograph. FINDINGS: Stable cardiomediastinal silhouette with normal heart size. No pneumothorax. No pleural effusion.  Clear lungs, with no focal lung consolidation and no pulmonary edema. IMPRESSION: No active cardiopulmonary disease. Electronically Signed   By: Ilona Sorrel M.D.   On: 02/06/2015 21:54   Ct Head Wo Contrast  02/06/2015  CLINICAL DATA:  Left-sided weakness for 2-3 days. History of multiple strokes. EXAM: CT HEAD WITHOUT CONTRAST TECHNIQUE: Contiguous axial images were obtained from the base of the skull through the vertex without intravenous contrast. COMPARISON:  08/27/2014 FINDINGS: Mild chronic small vessel disease throughout the deep white matter. No acute intracranial abnormality. Specifically, no hemorrhage, hydrocephalus, mass lesion, acute infarction, or significant intracranial injury. No acute calvarial abnormality. Opacified left maxillary sinus is stable since prior study. Postoperative changes in the left anterior maxillary wall and floor the left orbit. Mastoid air cells are clear. IMPRESSION: Chronic small vessel disease throughout the deep white matter. No acute intracranial abnormality. Electronically Signed   By: Rolm Baptise M.D.   On: 02/06/2015 12:42   Mr Brain Wo Contrast  02/06/2015  CLINICAL DATA:   64 year old male with left side weakness beginning 2 days ago. Initial encounter. EXAM: MRI HEAD WITHOUT CONTRAST TECHNIQUE: Multiplanar, multiecho pulse sequences of the brain and surrounding structures were obtained without intravenous contrast. COMPARISON:  Head CT without contrast 1233 hours today. Brain MRI 08/27/2014. FINDINGS: Chronically abnormal appearance of the right ICA siphon, but with suggestion of reconstituted flow at the right ICA terminus. Overall Major intracranial vascular flow voids are stable since July. Patchy mostly cortically based restricted diffusion in the right superior frontal gyrus involving the MCA territory (some MCA/ACA watershed area in the pre motor region). White matter extension to the right corona radiata on series 4, image 18. No contralateral left hemisphere or posterior fossa restricted diffusion. Associated patchy T2 and FLAIR hyperintensity in the acutely affected areas. No definite associated acute hemorrhage, although there is probably vascular related T2* asymmetry along the medial right hemisphere (series 8, images 16-19) which is new since July. Chronic micro hemorrhage in the right occipital lobe is unchanged. Widespread scattered cerebral white matter T2 and FLAIR hyperintensity elsewhere in both hemispheres, greater on the right, then is stable since July. No midline shift, mass effect, evidence of mass lesion, ventriculomegaly, extra-axial collection or acute intracranial hemorrhage. Cervicomedullary junction and pituitary are within normal limits. Negative visualized cervical spine. Stable paranasal sinuses and mastoids. Interval postoperative changes to the left globe, otherwise stable orbit and scalp soft tissues. Visualized bone marrow signal is within normal limits. IMPRESSION: 1. 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 2. Chronic poor flow in the right ICA - See also 08/27/2014 Head MRA report.  Electronically Signed   By: Genevie Ann M.D.   On: 02/06/2015 16:19    Oren Binet, MD  [facility] Pager:[phone]  If 7PM-7AM, please contact night-coverage [email or web address] Password [email or web address] 02/08/2015, 3:32 PM   LOS: 2 days

## 2015-02-08 NOTE — Progress Notes (Signed)
PT Cancellation Note  Patient Details Name: [name] MRN: [id number] DOB: December 15, 1950   Cancelled Treatment:    Reason Eval/Treat Not Completed: Medical issues which prohibited therapy. Noted pt on bedrest with move to ICU s/p procedure. Noted bedrest to end in a.m. Will follow-up 12/31 re: PT evaluation   Dalissa Lovin 02/08/2015, 2:50 PM Pager [phone]

## 2015-02-08 NOTE — Progress Notes (Addendum)
STROKE TEAM PROGRESS NOTE   SUBJECTIVE (INTERVAL HISTORY) His PCA is at the bedside.  He had angio today with stents put in both at supraclinoid ICA and proximal ICA. He is in ICU post procedure for observation. Doing well after procedure.  OBJECTIVE Temp:  [97.7 F (36.5 C)-98.5 F (36.9 C)] 97.8 F (36.6 C) (12/30 1420) Pulse Rate:  [71-97] 79 (12/30 1600) Cardiac Rhythm:  [-] Normal sinus rhythm (12/30 1430) Resp:  [16-33] 23 (12/30 1600) BP: (98-148)/(60-84) 128/74 mmHg (12/30 1600) SpO2:  [93 %-100 %] 95 % (12/30 1600) Arterial Line BP: (96-180)/(43-65) 180/65 mmHg (12/30 1600) Weight:  [252 lb 3.3 oz (114.4 kg)] 252 lb 3.3 oz (114.4 kg) (12/30 1500)  CBC:   Recent Labs Lab 02/08/15 0551 02/08/15 1515  WBC 5.4 5.7  NEUTROABS 3.0 3.9  HGB 14.8 13.0  HCT 45.5 41.3  MCV 94.6 94.5  PLT 114* 106*    Basic Metabolic Panel:   Recent Labs Lab 02/06/15 1310 02/08/15 0551  NA 145 144  K 4.3 4.0  CL 110 109  CO2 25 28  GLUCOSE 115* 124*  BUN 9 8  CREATININE 0.92 0.82  CALCIUM 10.0 9.6    Lipid Panel:     Component Value Date/Time   CHOL 231* 02/06/2015 1830   TRIG 172* 02/06/2015 1830   HDL 47 02/06/2015 1830   CHOLHDL 4.9 02/06/2015 1830   VLDL 34 02/06/2015 1830   LDLCALC 150* 02/06/2015 1830   HgbA1c:  Lab Results  Component Value Date   HGBA1C 6.7* 02/06/2015   Urine Drug Screen:     Component Value Date/Time   LABOPIA NONE DETECTED 02/07/2015 2053   COCAINSCRNUR NONE DETECTED 02/07/2015 2053   LABBENZ NONE DETECTED 02/07/2015 2053   AMPHETMU NONE DETECTED 02/07/2015 2053   THCU NONE DETECTED 02/07/2015 2053   LABBARB NONE DETECTED 02/07/2015 2053      IMAGING I have personally reviewed the radiological images below and agree with the radiology interpretations.  Dg Chest 2 View 02/06/2015   No active cardiopulmonary disease.   Ct Head Wo Contrast 02/06/2015   Chronic small vessel disease throughout the deep white matter. No acute  intracranial abnormality.   Mr Brain Wo Contrast 02/06/2015   1. 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 2. Chronic poor flow in the right ICA   TTE 07/2014 - - Left ventricle: The cavity size was normal. There was mild concentric hypertrophy. Systolic function was normal. The estimated ejection fraction was in the range of 50% to 55%. Wall motion was normal; there were no regional wall motion abnormalities. Doppler parameters are consistent with abnormal left ventricular relaxation (grade 1 diastolic dysfunction). - Left atrium: The atrium was moderately dilated.  Cerebral angio - S/P bilateral common carotid and Rt Vert artery angiograms followed by endovascular complete revascularization of occluded RT ICA supraclinoid segment With stent assisted angioplasty and of severe prox rt ICA stenosis with stenting   PHYSICAL EXAM  Temp:  [97.7 F (36.5 C)-98.5 F (36.9 C)] 97.8 F (36.6 C) (12/30 1420) Pulse Rate:  [71-97] 79 (12/30 1600) Resp:  [16-33] 23 (12/30 1600) BP: (98-148)/(60-84) 128/74 mmHg (12/30 1600) SpO2:  [93 %-100 %] 95 % (12/30 1600) Arterial Line BP: (96-180)/(43-65) 180/65 mmHg (12/30 1600) Weight:  [252 lb 3.3 oz (114.4 kg)] 252 lb 3.3 oz (114.4 kg) (12/30 1500)  General - Well nourished, well developed, in no apparent distress.  Ophthalmologic - Fundi not visualized  due to eye movement.  Cardiovascular - Regular rate and rhythm with no murmur.  Mental Status -  Level of arousal and orientation to time, place, and person were intact. Language including expression, naming, repetition, comprehension was assessed and found intact, but moderate to severe dysarthria. Fund of Knowledge was assessed and was impaired.  Cranial Nerves II - XII - II - Visual field intact OU. III, IV, VI - Extraocular movements intact. V - Facial sensation intact bilaterally. VII - left facial droop. VIII - Hearing  & vestibular intact bilaterally. X - Palate elevates symmetrically, moderate to severe dysarthria. XI - Chin turning & shoulder shrug intact bilaterally. XII - Tongue protrusion intact.  Motor Strength - The patient's strength was LUE proximal 3-/5 and bicept and tricep 3-/5, hand grip 3/5, LLE proximal 3-/5 and distal 3/5. RUE and RLE 5/5. Bulk was normal and fasciculations were absent.   Motor Tone - Muscle tone was assessed at the neck and appendages and was normal.  Reflexes - The patient's reflexes were 1+ in all extremities and he had no pathological reflexes.  Sensory - Light touch, temperature/pinprick were assessed and were symmetrical.    Coordination - The patient had normal movements in the right hand with no ataxia or dysmetria.  Tremor was absent.  Gait and Station - not tested due to weakness.   ASSESSMENT/PLAN Mr. MONIQUE GIFT is a 64 y.o. male with history of previous R brain strokes presenting with left sided weakness. He did not receive IV t-PA due to delay in arrival.   Stroke:  Recurrent scattered right MCA and MCA/ACA watershed infarcts felt to be secondary to known R ICA supraclinoid near occlusion. Hx of stroke all in the right anterior circulation due to right ICA stenosis.   MRI  scattered right MCA and MCA/ACA watershed infarcts   Cerebral angio s/p R ICA supraclinoid and proximal stenting  LDL 150  HgbA1c 6.7  SCDs for VTE prophylaxis Diet clear liquid Room service appropriate?: Yes; Fluid consistency:: Thin  aspirin 325 mg daily and clopidogrel 75 mg daily prior to admission, now on aspirin 325 mg daily and clopidogrel 75 mg daily. Continue dual antiplatelet for at least 6 months due to stenting.  Ongoing aggressive stroke risk factor management  Therapy recommendations:  CIR  Disposition:  pending   Hx stroke/TIA   August 01, 2014  right frontal lobe watershed infarcts with hypotension in setting of R paraclinoid ICA stenosis   August 19, 2014  right posterior frontal infarct secondary to hypotension in setting of severe R paraclinoid stenosis  August 26, 2014  right corpus callosum infarcts in setting of R frontal infarcts in June, and new right frontal at early July, felt to be watershed in setting of hypotension due to R ICA supraclinoid near occlusion, though could not rule out embolic source.  PVD  Follow up with Dr. Scot Dock as outpt  Planning for redo right femoral to below knee popliteal artery bypass  Dr. Scot Dock may want to see him during this admission  Hypertension Stable BP goal 120-140 s/p procedure Keep BP < 160 cardene PRN  Hyperlipidemia  Home meds:  lipitor 40, resumed in hospital  LDL 150, goal < 70  Increase lipitor to '80mg'$   Continue statin at discharge  Tobacco abuse  Current smoker  Smoking cessation counseling provided  Pt is not firm on quiting  No nicotin patch provided as pt likely continue to smoke after discharge  Other Stroke Risk Factors  Advanced  age  Marijuana use  ETOH use  Hx cocaine use w/ benzo and opiates, UDS positive in July 2016. Have ordered UDS this admission  Obesity, Body mass index is 34.2 kg/(m^2).   Coronary artery disease - MI  Obstructive sleep apnea, on CPAP at home  Hx CHF  Hx of DVT  Hx DVT, PVL in 02/2014 significant for RLE DVT involving the popliteal vein, treated with xarelto, noncompliant - had stopped d/t rectal bleeding prior to last admission, never resumed, though instructed. Refused colonoscopy in hospital.  As per note, his 02/2014 DVT is the second DVT  However, pt is not compliance with [facility] and stated that he had GIB in the past with [facility].   In July 2016, Due to noncompliance with medication, illicit drug use and GIB on [facility], did not recommend to resume Xarelto   hypercoagulable panel / vasculitic labs completed in the past  Neg for DVT during previous admission  Other Active Problems  COPD  Hospital day # 2  The  patient is at risk for recurrent strokes and TIAs and neurological worsening and he needs ongoing stroke evaluation and aggressive risk factor modification. Currently at ICU level care. Close monitoring BP to avoid hyperperfusion syndrome.   Rosalin Hawking, MD PhD Stroke Neurology 02/08/2015 4:14 PM      To contact Stroke Continuity provider, please refer to [email or web address] After hours, contact General Neurology

## 2015-02-08 NOTE — Anesthesia Postprocedure Evaluation (Signed)
Anesthesia Post Note  Patient: [name]  Procedure(s) Performed: Procedure(s) (LRB): RADIOLOGY WITH ANESTHESIA (N/A)  Patient location during evaluation: PACU Anesthesia Type: General Level of consciousness: awake and alert and oriented Pain management: pain level controlled Vital Signs Assessment: post-procedure vital signs reviewed and stable Respiratory status: spontaneous breathing, respiratory function stable and patient connected to nasal cannula oxygen Cardiovascular status: blood pressure returned to baseline and stable Postop Assessment: no signs of nausea or vomiting Anesthetic complications: no    Last Vitals:  Filed Vitals:   02/08/15 1323 02/08/15 1330  BP:    Pulse: 75 79  Temp:    Resp:  22    Last Pain:  Filed Vitals:   02/08/15 1333  PainSc: 0-No pain                 Jobie Popp A.

## 2015-02-08 NOTE — Evaluation (Signed)
Speech Language Pathology Evaluation Patient Details Name: [name] MRN: [id number] DOB: March 13, 1950 Today's Date: 02/08/2015 Time: 1510-1530 SLP Time Calculation (min) (ACUTE ONLY): 20 min  Problem List:  Patient Active Problem List   Diagnosis Date Noted  . History of stroke   . Stroke (cerebrum) ([place]) 02/06/2015  . Arterial ischemic stroke, MCA (middle cerebral artery), right, acute ([place])   . DOE (dyspnea on exertion) 11/23/2014  . PAF (paroxysmal atrial fibrillation) ([place]) 11/23/2014  . HLD (hyperlipidemia) 11/01/2014  . Tobacco use disorder 11/01/2014  . Diastolic CHF, acute on chronic (HCC) 11/01/2014  . Cerebral infarction due to stenosis of right carotid artery ([place]) 11/01/2014  . Carotid stenosis 11/01/2014  . Hyperkalemia 10/24/2014  . H/O total knee replacement 10/24/2014  . Essential hypertension   . Polysubstance abuse 08/28/2014  . Cocaine abuse   . Intracranial carotid stenosis   . Headache around the eyes 08/27/2014  . Acute CVA (cerebrovascular accident) ([place]) 08/27/2014  . CVA (cerebral vascular accident) ([place]) 08/26/2014  . History of DVT (deep vein thrombosis)   . Alcoholism ([place])   . Embolic stroke ([place])   . Cerebral infarction due to unspecified mechanism   . Other specified hypotension   . AKI (acute kidney injury) ([place])   . Stroke ([place])   . CVA (cerebral infarction) 08/01/2014  . Alcohol dependence with withdrawal with complication ([place]) 65/78/4696  . DVT (deep venous thrombosis) ([place]) 02/22/2014  . Lower extremity edema 02/21/2014  . Diastolic CHF ([place]) 29/52/8413  . CAD (coronary artery disease) 11/21/2013  . Alcohol abuse 11/21/2013  . GERD (gastroesophageal reflux disease) 08/10/2013  . Gout 08/10/2013  . Tobacco abuse 07/26/2013  . Dizziness 04/10/2013  . Binocular vision disorder with diplopia 01/03/2013  . Fracture of inferior orbital wall ([place]) 01/03/2013  . Peripheral vascular disease ([place]) 07/27/2012  . COPD (chronic  obstructive pulmonary disease) ([place]) 04/04/2012  . HTN (hypertension) 04/04/2012   Past Medical History:  Past Medical History  Diagnosis Date  . Depression   . Hypertension   . Hyperlipidemia   . Myocardial infarction ([place]) 2000  . CHF (congestive heart failure) ([place])   . COPD (chronic obstructive pulmonary disease) ([place])   . Peripheral vascular disease, unspecified ([place])     08/20/10 doppler: increase in right ABI post-op. Left ABI stable. S/P bi-fem bypass surgery  . GERD (gastroesophageal reflux disease)   . Asthma   . Anxiety   . History of DVT of lower extremity   . CAD (coronary artery disease) 05/27/10    Cath: severe single vessell CAD left cx midportion obtuse marginal 2 to 3.  . Shortness of breath   . Pneumonia 04/04/2012  . Arthritis   . OSA on CPAP   . Hyperlipemia   . COPD (chronic obstructive pulmonary disease) ([place])   . Alcohol abuse     H/O  . Tobacco abuse   . Orbital fracture ([place]) 12/2012  . Stroke [facility])    Past Surgical History:  Past Surgical History  Procedure Laterality Date  . Femoral bypass  08/19/10    Right Fem-Pop  . Aortogram w/ ptca  09/292003  . Eye surgery    . Back surgery    . Total knee arthroplasty    . Cholecystectomy    . Cardiac catheterization  05/27/10    severe CAD left cx  . Abdoninal ao angio & bifem angio  05/27/10    Patent graft, occluded bil stents with no retrograde flow into the hypogastric arteries. 100%  occl left ant. tibial artery, 70% to 80% to 100% stenosis right superficial fem artery above adductor canal. 100 % occl right ant. tibial vessell  . Esophagogastric fundoplication    . Tee without cardioversion N/A 08/29/2014    Procedure: TRANSESOPHAGEAL ECHOCARDIOGRAM (TEE);  Surgeon: Josue Hector, MD;  Location: [facility] ENDOSCOPY;  Service: Cardiovascular;  Laterality: N/A;  . Peripheral vascular catheterization N/A 12/10/2014    Procedure: Abdominal Aortogram;  Surgeon: Angelia Mould, MD;  Location: [place]  CV LAB;  Service: Cardiovascular;  Laterality: N/A;   HPI:  [name] is a 64 y.o. male with a Past Medical History of HTN, HLD, MI, CHF, COPD, GERD, CVA, CAD, DVT, OSA on CPAP, EtOH abuse, tobacco use who presents with another stroke while on aspirin and Plavix and statin. Patient had right ICA stent placed today (12/30).    Assessment / Plan / Recommendation Clinical Impression  Patient presents with a moderate voice impairment that is likely caused by irritation caused by intubation and surgery for ICA stent placement which occured today, 12/30. Patient's vocal quality is very hoarse. He verbalized a globus sensation in throat on right side. Patient's dysarthria is mild-moderate and impacted by his poor vocal quality, as well as absence of dentures. When patient spoke at phrase level and at slower rate, he could be understood 90% of the time, but at sentence and conversational level, this decreased to 25-40%. Patient's cognitive-linguistic functioning appears to be intact, although no family/friends present to verify his baseline functioning. Patient will benefit from brief follow-up by SLP to determine need for additional treatment post-hospital admission.    SLP Assessment  Patient needs continued Speech Lanaguage Pathology Services    Follow Up Recommendations  None    Frequency and Duration min 1 x/week  1 week      SLP Evaluation Prior Functioning  Cognitive/Linguistic Baseline: Within functional limits Type of Home: Apartment Available Help at Discharge: Personal care attendant (stated he has aide "2 hours a day")   Cognition  Overall Cognitive Status: No family/caregiver present to determine baseline cognitive functioning Orientation Level: Oriented to person;Oriented to time;Oriented to place;Oriented to situation    Comprehension  Auditory Comprehension Overall Auditory Comprehension: Appears within functional limits for tasks assessed    Expression  Expression Primary Mode of Expression: Verbal Verbal Expression Overall Verbal Expression: Appears within functional limits for tasks assessed   Oral / Motor Motor Speech Respiration: Within functional limits Phonation: Hoarse Resonance: Within functional limits Articulation: Impaired Level of Impairment: Phrase Intelligibility: Intelligibility reduced Word: 75-100% accurate Phrase: 50-74% accurate Sentence: 25-49% accurate Conversation: 25-49% accurate Motor Planning: Witnin functional limits Motor Speech Errors: Not applicable Interfering Components:  (impact of irritation to throat from ICA stent placed 12/30) Effective Techniques: Slow rate;Increased vocal intensity    Dannial Monarch 02/08/2015, 6:20 PM   Sonia Baller, MA, CCC-SLP 02/08/2015 6:21 PM

## 2015-02-08 NOTE — Care Management Note (Signed)
Case Management Note  Patient Details  Name: [name] MRN: [id number] Date of Birth: 07-25-1950  Subjective/Objective:   Pt admitted on 02/06/15 with Rt MCA stroke.  PTA, pt independent, lives alone.                   Action/Plan:  PT/OT recommending CIR, and consult in process.  Will follow for discharge planning as pt progresses.    Expected Discharge Date:                  Expected Discharge Plan:  [place]  In-House Referral:     Discharge planning Services  CM Consult  Post Acute Care Choice:    Choice offered to:     DME Arranged:    DME Agency:     HH Arranged:    [place] Agency:     Status of Service:  In process, will continue to follow  Medicare Important Message Given:  Yes Date Medicare IM Given:    Medicare IM give by:    Date Additional Medicare IM Given:    Additional Medicare Important Message give by:     If discussed at [place] of Stay Meetings, dates discussed:    Additional Comments:  Reinaldo Raddle, RN, BSN  Trauma/Neuro ICU Case Manager [phone]

## 2015-02-08 NOTE — Progress Notes (Signed)
ANTICOAGULATION CONSULT NOTE - Initial Consult  Pharmacy Consult for heparin Indication: post-intracranial stent  Allergies  Allergen Reactions  . Darvocet [Propoxyphene N-Acetaminophen] Hives  . Haldol [Haloperidol Decanoate] Hives  . Acetaminophen Nausea Only    Upset stomach, tolerates Hydrocodone/APAP if taken with food Upset stomach, tolerates Hydrocodone/APAP if taken with food  . Norco [Hydrocodone-Acetaminophen] Nausea And Vomiting    Tolerates if taken with food    Patient Measurements: Height: 6' (182.9 cm) Weight: 256 lb (116.121 kg) IBW/kg (Calculated) : 77.6 Heparin Dosing Weight: 102.7 kg  Vital Signs: Temp: 97.8 F (36.6 C) (12/30 1304) Temp Source: Oral (12/30 0508) BP: 146/72 mmHg (12/30 0508) Pulse Rate: 80 (12/30 0508)  Labs:  Recent Labs  02/06/15 1310 02/07/15 0958 02/08/15 0551  HGB 15.6  --  14.8  HCT 47.2  --  45.5  PLT 125*  --  114*  APTT  --  28 28  LABPROT  --  14.2 14.1  INR  --  1.08 1.07  CREATININE 0.92  --  0.82    Estimated Creatinine Clearance: 118.1 mL/min (by C-G formula based on Cr of 0.82).   Medical History: Past Medical History  Diagnosis Date  . Depression   . Hypertension   . Hyperlipidemia   . Myocardial infarction ([place]) 2000  . CHF (congestive heart failure) ([place])   . COPD (chronic obstructive pulmonary disease) ([place])   . Peripheral vascular disease, unspecified ([place])     08/20/10 doppler: increase in right ABI post-op. Left ABI stable. S/P bi-fem bypass surgery  . GERD (gastroesophageal reflux disease)   . Asthma   . Anxiety   . History of DVT of lower extremity   . CAD (coronary artery disease) 05/27/10    Cath: severe single vessell CAD left cx midportion obtuse marginal 2 to 3.  . Shortness of breath   . Pneumonia 04/04/2012  . Arthritis   . OSA on CPAP   . Hyperlipemia   . COPD (chronic obstructive pulmonary disease) ([place])   . Alcohol abuse     H/O  . Tobacco abuse   . Orbital fracture ([place])  12/2012  . Stroke [facility])     Assessment: 64 yo M admitted 02/06/2015 with L sided weakness now s/p post-intracranial stent placement. Pharmacy consulted to dose heparin.   H/H stable, Plt 114, No s/sx of bleeding noted.  Goal of Therapy:  Heparin level 0.1-0.25 units/ml Monitor platelets by anticoagulation protocol: Yes   Plan:  - Change Heparin to 800 units/hr - Monitor 6hr heparin level, CBC and s/sx of bleeding  Dimitri Ped, PharmD. PGY-1 Pharmacy Resident Pager: [phone]  02/08/2015,1:18 PM

## 2015-02-08 NOTE — Procedures (Signed)
S/P bilateral common carotid and Rt Vert artery angiograms followed by endovascular complete revascularization of occluded RT ICA supraclinoid segment  With stent assisted angioplasty and of severe prox rt ICA stenosis with stenting

## 2015-02-08 NOTE — Progress Notes (Signed)
[place] for heparin Indication: post-intracranial stent  Allergies  Allergen Reactions  . Darvocet [Propoxyphene N-Acetaminophen] Hives  . Haldol [Haloperidol Decanoate] Hives  . Acetaminophen Nausea Only    Upset stomach, tolerates Hydrocodone/APAP if taken with food Upset stomach, tolerates Hydrocodone/APAP if taken with food  . Norco [Hydrocodone-Acetaminophen] Nausea And Vomiting    Tolerates if taken with food    Patient Measurements: Height: 6' (182.9 cm) Weight: 252 lb 3.3 oz (114.4 kg) IBW/kg (Calculated) : 77.6 Heparin Dosing Weight: 102.7 kg  Vital Signs: Temp: 97.8 F (36.6 C) (12/30 1420) Temp Source: Oral (12/30 1420) BP: 114/53 mmHg (12/30 2100) Pulse Rate: 80 (12/30 2100)  Labs:  Recent Labs  02/06/15 1310 02/07/15 0958 02/08/15 0551 02/08/15 1515 02/08/15 1945  HGB 15.6  --  14.8 13.0  --   HCT 47.2  --  45.5 41.3  --   PLT 125*  --  114* 106*  --   APTT  --  28 28  --   --   LABPROT  --  14.2 14.1  --   --   INR  --  1.08 1.07  --   --   HEPARINUNFRC  --   --   --   --  <0.10*  CREATININE 0.92  --  0.82  --   --     Estimated Creatinine Clearance: 117.3 mL/min (by C-G formula based on Cr of 0.82).   Medical History: Past Medical History  Diagnosis Date  . Depression   . Hypertension   . Hyperlipidemia   . Myocardial infarction ([place]) 2000  . CHF (congestive heart failure) ([place])   . COPD (chronic obstructive pulmonary disease) ([place])   . Peripheral vascular disease, unspecified ([place])     08/20/10 doppler: increase in right ABI post-op. Left ABI stable. S/P bi-fem bypass surgery  . GERD (gastroesophageal reflux disease)   . Asthma   . Anxiety   . History of DVT of lower extremity   . CAD (coronary artery disease) 05/27/10    Cath: severe single vessell CAD left cx midportion obtuse marginal 2 to 3.  . Shortness of breath   . Pneumonia 04/04/2012  . Arthritis   . OSA on CPAP   . Hyperlipemia   .  COPD (chronic obstructive pulmonary disease) ([place])   . Alcohol abuse     H/O  . Tobacco abuse   . Orbital fracture ([place]) 12/2012  . Stroke [facility])     Assessment: 64 yo M admitted 02/06/2015 with L sided weakness now s/p post-intracranial stent placement. Pharmacy consulted to dose heparin.  -initial heparin level is undetectable on 800 units/hr  Goal of Therapy:  Heparin level 0.1-0.25 units/ml Monitor platelets by anticoagulation protocol: Yes   Plan:  -Increase heparin to 1000 units/hr -Heparin level with CBC in am  Hildred Laser, Pharm D 02/08/2015 9:28 PM

## 2015-02-08 NOTE — Progress Notes (Signed)
Referring Physician(s): Xu  Chief Complaint:  R Internal Carotid artery stenosis R ICA stent 12/30  Subjective:  Resting; comfortable Does complain of sore throat Denies headache or other pain  Allergies: Darvocet; Haldol; Acetaminophen; and Norco  Medications: Prior to Admission medications   Medication Sig Start Date End Date Taking? Authorizing Provider  albuterol (PROVENTIL HFA;VENTOLIN HFA) 108 (90 BASE) MCG/ACT inhaler Inhale 2 puffs into the lungs every 6 (six) hours as needed for wheezing or shortness of breath. 01/29/15  Yes Arnoldo Morale, MD  albuterol (PROVENTIL) (2.5 MG/3ML) 0.083% nebulizer solution Take 3 mLs (2.5 mg total) by nebulization 2 (two) times daily as needed for wheezing or shortness of breath. 01/16/15  Yes Arnoldo Morale, MD  aspirin 325 MG EC tablet Take 1 tablet (325 mg total) by mouth daily. Patient taking differently: Take 325 mg by mouth every 6 (six) hours as needed for pain.  01/16/15  Yes Arnoldo Morale, MD  aspirin EC 81 MG tablet Take 81 mg by mouth daily.   Yes Historical Provider, MD  atorvastatin (LIPITOR) 40 MG tablet Take 1 tablet (40 mg total) by mouth daily at 6 PM. 01/16/15  Yes Arnoldo Morale, MD  carvedilol (COREG) 6.25 MG tablet Take 1 tablet (6.25 mg total) by mouth 2 (two) times daily with a meal. 01/29/15  Yes Arnoldo Morale, MD  clopidogrel (PLAVIX) 75 MG tablet Take 1 tablet (75 mg total) by mouth daily. 01/29/15  Yes Arnoldo Morale, MD  furosemide (LASIX) 40 MG tablet Take 1 tablet (40 mg total) by mouth 2 (two) times daily. 01/29/15  Yes Arnoldo Morale, MD  mometasone-formoterol (DULERA) 200-5 MCG/ACT AERO Inhale 2 puffs into the lungs 2 (two) times daily. 01/16/15  Yes Arnoldo Morale, MD  omeprazole (PRILOSEC) 20 MG capsule Take 1 capsule (20 mg total) by mouth daily. 01/16/15  Yes Arnoldo Morale, MD  tiotropium (SPIRIVA) 18 MCG inhalation capsule Place 1 capsule (18 mcg total) into inhaler and inhale daily. 01/29/15  Yes Arnoldo Morale, MD    tiZANidine (ZANAFLEX) 4 MG tablet Take 1 tablet (4 mg total) by mouth 3 (three) times daily. Patient taking differently: Take 4 mg by mouth every 8 (eight) hours as needed for muscle spasms.  01/16/15  Yes Arnoldo Morale, MD  Besifloxacin HCl (BESIVANCE) 0.6 % SUSP Place 1 drop into the left eye 2 (two) times daily.    Historical Provider, MD  Difluprednate (DUREZOL) 0.05 % EMUL Place 1 drop into the left eye 2 (two) times daily.    Historical Provider, MD  metoprolol tartrate (LOPRESSOR) 25 MG tablet Take 1 tablet (25 mg total) by mouth 2 (two) times daily. Patient not taking: Reported on 02/06/2015 01/16/15   Arnoldo Morale, MD  traMADol (ULTRAM) 50 MG tablet Take 1 tablet (50 mg total) by mouth every 8 (eight) hours as needed. Patient not taking: Reported on 02/06/2015 12/28/14   Arnoldo Morale, MD     Vital Signs: BP 121/66 mmHg  Pulse 74  Temp(Src) 97.8 F (36.6 C) (Oral)  Resp 24  Ht 6' (1.829 m)  Wt 252 lb 3.3 oz (114.4 kg)  BMI 34.20 kg/m2  SpO2 100%  Physical Exam  Constitutional: He is oriented to person, place, and time.  HENT:  Face symmetrical Tongue midline C collar in place  Musculoskeletal: Normal range of motion.  Moves all 4s Left leg 3/5 strength Left arm 4/5 strength Rt wnl  Rt groin NT; no bleeding Rt foot 2+ pulses  Neurological: He is alert and  oriented to person, place, and time.  Skin: Skin is warm and dry.  Psychiatric: He has a normal mood and affect. His behavior is normal. Judgment and thought content normal.  Nursing note and vitals reviewed.   Imaging: Dg Chest 2 View  02/06/2015  CLINICAL DATA:  CVA. EXAM: CHEST  2 VIEW COMPARISON:  08/19/2014 chest radiograph. FINDINGS: Stable cardiomediastinal silhouette with normal heart size. No pneumothorax. No pleural effusion. Clear lungs, with no focal lung consolidation and no pulmonary edema. IMPRESSION: No active cardiopulmonary disease. Electronically Signed   By: Ilona Sorrel M.D.   On: 02/06/2015  21:54   Ct Head Wo Contrast  02/06/2015  CLINICAL DATA:  Left-sided weakness for 2-3 days. History of multiple strokes. EXAM: CT HEAD WITHOUT CONTRAST TECHNIQUE: Contiguous axial images were obtained from the base of the skull through the vertex without intravenous contrast. COMPARISON:  08/27/2014 FINDINGS: Mild chronic small vessel disease throughout the deep white matter. No acute intracranial abnormality. Specifically, no hemorrhage, hydrocephalus, mass lesion, acute infarction, or significant intracranial injury. No acute calvarial abnormality. Opacified left maxillary sinus is stable since prior study. Postoperative changes in the left anterior maxillary wall and floor the left orbit. Mastoid air cells are clear. IMPRESSION: Chronic small vessel disease throughout the deep white matter. No acute intracranial abnormality. Electronically Signed   By: Rolm Baptise M.D.   On: 02/06/2015 12:42   Mr Brain Wo Contrast  02/06/2015  CLINICAL DATA:  64 year old male with left side weakness beginning 2 days ago. Initial encounter. EXAM: MRI HEAD WITHOUT CONTRAST TECHNIQUE: Multiplanar, multiecho pulse sequences of the brain and surrounding structures were obtained without intravenous contrast. COMPARISON:  Head CT without contrast 1233 hours today. Brain MRI 08/27/2014. FINDINGS: Chronically abnormal appearance of the right ICA siphon, but with suggestion of reconstituted flow at the right ICA terminus. Overall Major intracranial vascular flow voids are stable since July. Patchy mostly cortically based restricted diffusion in the right superior frontal gyrus involving the MCA territory (some MCA/ACA watershed area in the pre motor region). White matter extension to the right corona radiata on series 4, image 18. No contralateral left hemisphere or posterior fossa restricted diffusion. Associated patchy T2 and FLAIR hyperintensity in the acutely affected areas. No definite associated acute hemorrhage, although  there is probably vascular related T2* asymmetry along the medial right hemisphere (series 8, images 16-19) which is new since July. Chronic micro hemorrhage in the right occipital lobe is unchanged. Widespread scattered cerebral white matter T2 and FLAIR hyperintensity elsewhere in both hemispheres, greater on the right, then is stable since July. No midline shift, mass effect, evidence of mass lesion, ventriculomegaly, extra-axial collection or acute intracranial hemorrhage. Cervicomedullary junction and pituitary are within normal limits. Negative visualized cervical spine. Stable paranasal sinuses and mastoids. Interval postoperative changes to the left globe, otherwise stable orbit and scalp soft tissues. Visualized bone marrow signal is within normal limits. IMPRESSION: 1. 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 2. Chronic poor flow in the right ICA - See also 08/27/2014 Head MRA report. Electronically Signed   By: Genevie Ann M.D.   On: 02/06/2015 16:19    Labs:  CBC:  Recent Labs  08/26/14 1730  12/10/14 0822 02/06/15 1310 02/08/15 0551 02/08/15 1515  WBC 7.7  --   --  5.3 5.4 5.7  HGB 15.7  < > 16.7 15.6 14.8 13.0  HCT 46.2  < > 49.0 47.2 45.5 41.3  PLT 175  --   --  125* 114* PENDING  < > = values in this interval not displayed.  COAGS:  Recent Labs  08/19/14 1823 08/26/14 1730 02/07/15 0958 02/08/15 0551  INR 1.09 1.01 1.08 1.07  APTT '28 27 28 28    '$ BMP:  Recent Labs  08/19/14 1823  08/26/14 1730  10/17/14 0956 10/24/14 1046 12/10/14 0822 02/06/15 1310 02/08/15 0551  NA 141  < > 135  < > 148* 149* 144 145 144  K 4.0  < > 4.0  < > 5.9* 6.0* 4.0 4.3 4.0  CL 105  < > 100*  < > 109 108 105 110 109  CO2 23  --  23  < > 30 32*  --  25 28  GLUCOSE 117*  < > 133*  < > 141* 166* 127* 115* 124*  BUN 11  < > 16  < > '8 15 19 9 8  '$ CALCIUM 10.3  --  9.6  < > 10.1 10.9*  --  10.0 9.6  CREATININE 1.69*  < > 1.36*  <  > 0.97 1.11 0.90 0.92 0.82  GFRNONAA 41*  --  53*  --   --   --   --  >60 >60  GFRAA 47*  --  >60  --   --   --   --  >60 >60  < > = values in this interval not displayed.  LIVER FUNCTION TESTS:  Recent Labs  08/19/14 1823 08/26/14 1730 02/06/15 1310 02/08/15 0551  BILITOT 0.8 0.7 0.5 0.7  AST 33 29 16 13*  ALT 33 26 17 13*  ALKPHOS 70 69 66 63  PROT 7.3 7.4 6.2* 6.0*  ALBUMIN 4.1 4.0 3.7 3.4*    Assessment and Plan:  R ICA stenosis Angioplasty/stent placed in IR with Dr Estanislado Pandy 12/30 Will follow  Signed: Harmoney Sienkiewicz A 02/08/2015, 3:46 PM   I spent a total of 15 Minutes at the the patient's bedside AND on the patient's hospital floor or unit, greater than 50% of which was counseling/coordinating care for R ICA stent/pta

## 2015-02-09 DIAGNOSIS — I63231 Cerebral infarction due to unspecified occlusion or stenosis of right carotid arteries: Secondary | ICD-10-CM | POA: Insufficient documentation

## 2015-02-09 LAB — BASIC METABOLIC PANEL
ANION GAP: 7 (ref 5–15)
CALCIUM: 9.2 mg/dL (ref 8.9–10.3)
CO2: 28 mmol/L (ref 22–32)
CREATININE: 0.7 mg/dL (ref 0.61–1.24)
Chloride: 112 mmol/L — ABNORMAL HIGH (ref 101–111)
GFR calc Af Amer: >60 mL/min (ref >60)
GLUCOSE: 110 mg/dL — AB (ref 65–99)
Potassium: 3.8 mmol/L (ref 3.5–5.1)
SODIUM: 147 mmol/L — AB (ref 135–145)

## 2015-02-09 LAB — CBC WITH DIFFERENTIAL/PLATELET
BASOS ABS: 0 10*3/uL (ref 0.0–0.1)
Basophils Relative: 0 %
EOS PCT: 4 %
Eosinophils Absolute: 0.2 10*3/uL (ref 0.0–0.7)
HCT: 41 % (ref 39.0–52.0)
Hemoglobin: 12.9 g/dL — ABNORMAL LOW (ref 13.0–17.0)
LYMPHS PCT: 22 %
Lymphs Abs: 1.2 10*3/uL (ref 0.7–4.0)
MCH: 29.7 pg (ref 26.0–34.0)
MCHC: 31.5 g/dL (ref 30.0–36.0)
MCV: 94.3 fL (ref 78.0–100.0)
Monocytes Absolute: 0.7 10*3/uL (ref 0.1–1.0)
Monocytes Relative: 13 %
NEUTROS PCT: 61 %
Neutro Abs: 3.4 10*3/uL (ref 1.7–7.7)
PLATELETS: 108 10*3/uL — AB (ref 150–400)
RBC: 4.35 MIL/uL (ref 4.22–5.81)
RDW: 14.4 % (ref 11.5–15.5)
WBC: 5.5 10*3/uL (ref 4.0–10.5)

## 2015-02-09 LAB — HEPARIN LEVEL (UNFRACTIONATED): Heparin Unfractionated: 0.11 IU/mL — ABNORMAL LOW (ref 0.30–0.70)

## 2015-02-09 MED ORDER — CARVEDILOL 6.25 MG PO TABS: 6.2500 mg | ORAL_TABLET | Freq: Two times a day (BID) | ORAL | Status: DC

## 2015-02-09 MED ORDER — FUROSEMIDE 40 MG PO TABS
40.0000 mg | ORAL_TABLET | Freq: Two times a day (BID) | ORAL | Status: DC
  Administered 2015-02-09 – 2015-02-13 (×8): 40 mg via ORAL
  Filled 2015-02-09 (×8): qty 1

## 2015-02-09 MED ORDER — CARVEDILOL 12.5 MG PO TABS
12.5000 mg | ORAL_TABLET | Freq: Two times a day (BID) | ORAL | Status: DC
  Administered 2015-02-09 – 2015-02-13 (×8): 12.5 mg via ORAL
  Filled 2015-02-09 (×8): qty 1

## 2015-02-09 MED ORDER — ENOXAPARIN SODIUM 40 MG/0.4ML ~~LOC~~ SOLN
40.0000 mg | SUBCUTANEOUS | Status: DC
  Administered 2015-02-09 – 2015-02-13 (×5): 40 mg via SUBCUTANEOUS
  Filled 2015-02-09 (×5): qty 0.4

## 2015-02-09 NOTE — Progress Notes (Signed)
1 Day Post-Op  Subjective: Post RT ICA intracranial stent assisted angioplasty and Rt prox ICA stenting  For sumptomatic Rt cerebral ischemia. Stable night. Denies any H/As,N/V,visual speech,motoe or sensory or co ordination changes. Tolerating po liquids  With out difficulty. Mild difficulty with breathing due to COPD unchanged.Patient refused CPAP. Denies any chest pains.   Objective: Vital signs in last 24 hours: Temp:  [97.7 F (36.5 C)-98.8 F (37.1 C)] 98 F (36.7 C) (12/31 0800) Pulse Rate:  [63-103] 84 (12/31 0900) Resp:  [13-33] 29 (12/31 0900) BP: (98-164)/(53-95) 130/71 mmHg (12/31 0900) SpO2:  [87 %-100 %] 96 % (12/31 0900) Arterial Line BP: (96-200)/(43-75) 162/66 mmHg (12/31 0900) Weight:  [252 lb 3.3 oz (114.4 kg)] 252 lb 3.3 oz (114.4 kg) (12/30 1500) Last BM Date: 02/05/15  Intake/Output from previous day: 12/30 0701 - 12/31 0700 In: 2931 [I.V.:2931] Out: 2325 [Urine:2325] Intake/Output this shift: Total I/O In: 250 [I.V.:250] Out: 230 [Urine:230]  VS BP 120  To 130s /70s on cardene. PaO2 > 95 % on 2 ls O2 [place] HR 80s SR.  Neurologically Stable dysarthria. Mild Lt facial droop. Lt UE 3+/5 prox and 4/5 hand grip. LLE prox 3/5 distally 3+/5 on plantar and dorsiflexion. Coordination F to N R =L WNLs. RT groin soft.No palpable hematoma.   Lab Results:   Recent Labs  02/08/15 1515 02/09/15 0425  WBC 5.7 5.5  HGB 13.0 12.9*  HCT 41.3 41.0  PLT 106* 108*   BMET  Recent Labs  02/08/15 0551 02/09/15 0425  NA 144 147*  K 4.0 3.8  CL 109 112*  CO2 28 28  GLUCOSE 124* 110*  BUN 8 <5*  CREATININE 0.82 0.70  CALCIUM 9.6 9.2   PT/INR  Recent Labs  02/07/15 0958 02/08/15 0551  LABPROT 14.2 14.1  INR 1.08 1.07   ABG No results for input(s): PHART, HCO3 in the last 72 hours.  Invalid input(s): PCO2, PO2  Studies/Results: No results found.  Anti-infectives: Anti-infectives    Start     Dose/Rate Route Frequency Ordered Stop   02/08/15 0845  ceFAZolin (ANCEF) IVPB 2 g/50 mL premix     2 g 100 mL/hr over 30 Minutes Intravenous To Radiology 02/07/15 1244 02/08/15 0913      Assessment/Plan: s/p As above. Plan . 1.leave on aspirin 325 mg and plavix 75 mg per day.Marland KitchenResume pre procedural other meds. 2D/C art line,foley ,Cx collar,heparin. 3.Advance diet. 4.As per  Stroke neurology. 5.Will see in clinic in 2 weeks post D/C. D/W patient   Rob Hickman 02/09/2015

## 2015-02-09 NOTE — Progress Notes (Signed)
PATIENT DETAILS Name: [name] Age: 64 y.o. Sex: male Date of Birth: 1950/05/07 Admit Date: 02/06/2015 Admitting Physician Waldemar Dickens, MD [id number], [name], MD  Subjective: Seems to have more left-sided strength today compared to yesterday.  Assessment/Plan: Principal Problem: Recurrent right MCA territory infarct: Secondary to known right ICA supraclinoid near occlusion. Has very mild left sided weakness-around 4/5-slightly improved compared to yesterday. Seen by neurology, recommendations were for cerebral angiogram and stenting.Interventional radiology was consulted, patient underwent catheter guided cerebral angiogram with stenting of RT ICA supraclinoid segment on 12/30. Continue aspirin/Plavix and statin   Active Problems: Hypertension: Moderately Controlled, continue Coreg-increased to 12.5 mg twice a day. Follow   Dyslipidemia: Continue Lipitor, LDL- 150 (goal<70)  COPD: Lungs clear, continue current bronchodilator regimen.  History of CAD: No chest pain or shortness of breath, continue antiplatelets, statin, resume  Coreg. Most recent nuclear stress test in October 2016 negative for ischemia.  History of peripheral vascular disease: Followed by vascular surgery-plans are to redo right femoral to below knee popliteal bypass-tentatively scheduled for 02/19/2015.  History of DVT: Previously on xarelto-this was discontinued because of noncompliance  GERD:PPI  Polysubstance abuse: Previous urinary drug screen is positive for cocaine/benzo/opiates. Urine drug screen pending this admission.  UJW:JXBJ qhs  Disposition: Remain inpatient-? Transfer back to telemetry when okay with neurology/interventional radiology   Antimicrobial agents  See below  Anti-infectives    Start     Dose/Rate Route Frequency Ordered Stop   02/08/15 0845  ceFAZolin (ANCEF) IVPB 2 g/50 mL premix     2 g 100 mL/hr over 30 Minutes Intravenous To Radiology 02/07/15  1244 02/08/15 0913      DVT Prophylaxis: Lovenox  Code Status: Full code   Family Communication None at bedside  Procedures: None  CONSULTS:  neurology and IR  Time spent 25 minutes-Greater than 50% of this time was spent in counseling, explanation of diagnosis, planning of further management, and coordination of care.  MEDICATIONS: Scheduled Meds: . aspirin  325 mg Oral Q breakfast  . atorvastatin  80 mg Oral q1800  . carvedilol  6.25 mg Oral BID WC  . clopidogrel  75 mg Oral Q breakfast  . folic acid  1 mg Oral Daily  . furosemide  40 mg Oral BID  . gatifloxacin  1 drop Left Eye QID  . mometasone-formoterol  2 puff Inhalation BID  . multivitamin with minerals  1 tablet Oral Daily  . pantoprazole  40 mg Oral Daily  . thiamine  100 mg Oral Daily   Or  . thiamine  100 mg Intravenous Daily  . tiotropium  18 mcg Inhalation Daily   Continuous Infusions:   PRN Meds:.acetaminophen **OR** acetaminophen, albuterol, diclofenac sodium, LORazepam **OR** LORazepam, methocarbamol, ondansetron (ZOFRAN) IV, senna-docusate, tiZANidine, traMADol    PHYSICAL EXAM: Vital signs in last 24 hours: Filed Vitals:   02/09/15 0715 02/09/15 0800 02/09/15 0900 02/09/15 1000  BP: 142/65 128/65 130/71 136/84  Pulse: 79 71 84 84  Temp:  98 F (36.7 C)    TempSrc:  Oral    Resp: '23 24 29 18  '$ Height:      Weight:      SpO2: 96% 96% 96% 97%    Weight change:  Filed Weights   02/07/15 0005 02/08/15 1500  Weight: 116.121 kg (256 lb) 114.4 kg (252 lb 3.3 oz)   Body mass index is 34.2 kg/(m^2).  Gen Exam: Awake and alert with clear speech.   Neck: Supple, No JVD.   Chest: B/L Clear.   CVS: S1 S2 Regular, no murmurs.  Abdomen: soft, BS +, non tender, non distended.  Extremities: no edema, lower extremities warm to touch. Neurologic: Left sided weakness-4/5   Skin: No Rash.   Wounds: N/A.    Intake/Output from previous day:  Intake/Output Summary (Last 24 hours) at 02/09/15  1052 Last data filed at 02/09/15 0900  Gross per 24 hour  Intake   3181 ml  Output   2555 ml  Net    626 ml     LAB RESULTS: CBC  Recent Labs Lab 02/06/15 1310 02/08/15 0551 02/08/15 1515 02/09/15 0425  WBC 5.3 5.4 5.7 5.5  HGB 15.6 14.8 13.0 12.9*  HCT 47.2 45.5 41.3 41.0  PLT 125* 114* 106* 108*  MCV 93.8 94.6 94.5 94.3  MCH 31.0 30.8 29.7 29.7  MCHC 33.1 32.5 31.5 31.5  RDW 14.2 14.3 14.4 14.4  LYMPHSABS 1.5 1.4 1.1 1.2  MONOABS 0.7 0.7 0.6 0.7  EOSABS 0.2 0.3 0.1 0.2  BASOSABS 0.0 0.0 0.0 0.0    Chemistries   Recent Labs Lab 02/06/15 1310 02/08/15 0551 02/09/15 0425  NA 145 144 147*  K 4.3 4.0 3.8  CL 110 109 112*  CO2 '25 28 28  '$ GLUCOSE 115* 124* 110*  BUN 9 8 <5*  CREATININE 0.92 0.82 0.70  CALCIUM 10.0 9.6 9.2    CBG:  Recent Labs Lab 02/08/15 0820  GLUCAP 118*    GFR Estimated Creatinine Clearance: 120.2 mL/min (by C-G formula based on Cr of 0.7).  Coagulation profile  Recent Labs Lab 02/07/15 0958 02/08/15 0551  INR 1.08 1.07    Cardiac Enzymes No results for input(s): CKMB, TROPONINI, MYOGLOBIN in the last 168 hours.  Invalid input(s): CK  Invalid input(s): POCBNP No results for input(s): DDIMER in the last 72 hours.  Recent Labs  02/06/15 1830  HGBA1C 6.7*    Recent Labs  02/06/15 1830  CHOL 231*  HDL 47  LDLCALC 150*  TRIG 172*  CHOLHDL 4.9   No results for input(s): TSH, T4TOTAL, T3FREE, THYROIDAB in the last 72 hours.  Invalid input(s): FREET3 No results for input(s): VITAMINB12, FOLATE, FERRITIN, TIBC, IRON, RETICCTPCT in the last 72 hours. No results for input(s): LIPASE, AMYLASE in the last 72 hours.  Urine Studies No results for input(s): UHGB, CRYS in the last 72 hours.  Invalid input(s): UACOL, UAPR, USPG, UPH, UTP, UGL, UKET, UBIL, UNIT, UROB, ULEU, UEPI, UWBC, URBC, UBAC, CAST, UCOM, BILUA  MICROBIOLOGY: Recent Results (from the past 240 hour(s))  Surgical pcr screen     Status: None    Collection Time: 02/07/15  1:03 AM  Result Value Ref Range Status   MRSA, PCR NEGATIVE NEGATIVE Final   Staphylococcus aureus NEGATIVE NEGATIVE Final    Comment:        The Xpert SA Assay (FDA approved for NASAL specimens in patients over 3 years of age), is one component of a comprehensive surveillance program.  Test performance has been validated by [facility] for patients greater than or equal to 71 year old. It is not intended to diagnose infection nor to guide or monitor treatment.     RADIOLOGY STUDIES/RESULTS: Dg Chest 2 View  02/06/2015  CLINICAL DATA:  CVA. EXAM: CHEST  2 VIEW COMPARISON:  08/19/2014 chest radiograph. FINDINGS: Stable cardiomediastinal silhouette with normal heart size. No pneumothorax. No pleural effusion. Clear lungs,  with no focal lung consolidation and no pulmonary edema. IMPRESSION: No active cardiopulmonary disease. Electronically Signed   By: Ilona Sorrel M.D.   On: 02/06/2015 21:54   Ct Head Wo Contrast  02/06/2015  CLINICAL DATA:  Left-sided weakness for 2-3 days. History of multiple strokes. EXAM: CT HEAD WITHOUT CONTRAST TECHNIQUE: Contiguous axial images were obtained from the base of the skull through the vertex without intravenous contrast. COMPARISON:  08/27/2014 FINDINGS: Mild chronic small vessel disease throughout the deep white matter. No acute intracranial abnormality. Specifically, no hemorrhage, hydrocephalus, mass lesion, acute infarction, or significant intracranial injury. No acute calvarial abnormality. Opacified left maxillary sinus is stable since prior study. Postoperative changes in the left anterior maxillary wall and floor the left orbit. Mastoid air cells are clear. IMPRESSION: Chronic small vessel disease throughout the deep white matter. No acute intracranial abnormality. Electronically Signed   By: Rolm Baptise M.D.   On: 02/06/2015 12:42   Mr Brain Wo Contrast  02/06/2015  CLINICAL DATA:  64 year old male with left side  weakness beginning 2 days ago. Initial encounter. EXAM: MRI HEAD WITHOUT CONTRAST TECHNIQUE: Multiplanar, multiecho pulse sequences of the brain and surrounding structures were obtained without intravenous contrast. COMPARISON:  Head CT without contrast 1233 hours today. Brain MRI 08/27/2014. FINDINGS: Chronically abnormal appearance of the right ICA siphon, but with suggestion of reconstituted flow at the right ICA terminus. Overall Major intracranial vascular flow voids are stable since July. Patchy mostly cortically based restricted diffusion in the right superior frontal gyrus involving the MCA territory (some MCA/ACA watershed area in the pre motor region). White matter extension to the right corona radiata on series 4, image 18. No contralateral left hemisphere or posterior fossa restricted diffusion. Associated patchy T2 and FLAIR hyperintensity in the acutely affected areas. No definite associated acute hemorrhage, although there is probably vascular related T2* asymmetry along the medial right hemisphere (series 8, images 16-19) which is new since July. Chronic micro hemorrhage in the right occipital lobe is unchanged. Widespread scattered cerebral white matter T2 and FLAIR hyperintensity elsewhere in both hemispheres, greater on the right, then is stable since July. No midline shift, mass effect, evidence of mass lesion, ventriculomegaly, extra-axial collection or acute intracranial hemorrhage. Cervicomedullary junction and pituitary are within normal limits. Negative visualized cervical spine. Stable paranasal sinuses and mastoids. Interval postoperative changes to the left globe, otherwise stable orbit and scalp soft tissues. Visualized bone marrow signal is within normal limits. IMPRESSION: 1. 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 2. Chronic poor flow in the right ICA - See also 08/27/2014 Head MRA report. Electronically Signed   By: Genevie Ann  M.D.   On: 02/06/2015 16:19    Oren Binet, MD  [facility] Pager:[phone]  If 7PM-7AM, please contact night-coverage [email or web address] Password [email or web address] 02/09/2015, 10:52 AM   LOS: 3 days

## 2015-02-09 NOTE — Progress Notes (Signed)
[place] for heparin Indication: post-intracranial stent  Allergies  Allergen Reactions  . Darvocet [Propoxyphene N-Acetaminophen] Hives  . Haldol [Haloperidol Decanoate] Hives  . Acetaminophen Nausea Only    Upset stomach, tolerates Hydrocodone/APAP if taken with food Upset stomach, tolerates Hydrocodone/APAP if taken with food  . Norco [Hydrocodone-Acetaminophen] Nausea And Vomiting    Tolerates if taken with food    Patient Measurements: Height: 6' (182.9 cm) Weight: 252 lb 3.3 oz (114.4 kg) IBW/kg (Calculated) : 77.6 Heparin Dosing Weight: 102.7 kg  Vital Signs: Temp: 98.3 F (36.8 C) (12/31 0400) Temp Source: Oral (12/31 0400) BP: 131/76 mmHg (12/31 0300) Pulse Rate: 86 (12/31 0300)  Labs:  Recent Labs  02/06/15 1310 02/07/15 0958 02/08/15 0551 02/08/15 1515 02/08/15 1945 02/09/15 0425  HGB 15.6  --  14.8 13.0  --  12.9*  HCT 47.2  --  45.5 41.3  --  41.0  PLT 125*  --  114* 106*  --  108*  APTT  --  28 28  --   --   --   LABPROT  --  14.2 14.1  --   --   --   INR  --  1.08 1.07  --   --   --   HEPARINUNFRC  --   --   --   --  <0.10* 0.11*  CREATININE 0.92  --  0.82  --   --   --     Estimated Creatinine Clearance: 117.3 mL/min (by C-G formula based on Cr of 0.82).   Assessment: 64 yo M admitted 02/06/2015 with L sided weakness on heparin s/p post-intracranial stent placement. Heparin level therapeutic (0.11) for low goal. CBC stable. Heparin to be turned off at 0700.  Goal of Therapy:  Heparin level 0.1-0.25 units/ml Monitor platelets by anticoagulation protocol: Yes   Plan:  -Continue heparin at 1000 units/hr -Heparin off at [place], PharmD, [place] pharmacist, pager [phone]  02/09/2015 4:51 AM

## 2015-02-09 NOTE — Progress Notes (Signed)
Pt refuses to wear CPAP.  °

## 2015-02-09 NOTE — Progress Notes (Signed)
STROKE TEAM PROGRESS NOTE   SUBJECTIVE (INTERVAL HISTORY) His RN is at the bedside.  He is doing well after procedure and overnight. Left UE and LE improved some. No complains. BP stable, heparin and cardene off and will put back on home BP meds.  OBJECTIVE Temp:  [98 F (36.7 C)-98.8 F (37.1 C)] 98.3 F (36.8 C) (12/31 1132) Pulse Rate:  [63-103] 85 (12/31 1500) Cardiac Rhythm:  [-] Normal sinus rhythm (12/31 0800) Resp:  [13-31] 25 (12/31 1500) BP: (103-164)/(53-95) 135/60 mmHg (12/31 1500) SpO2:  [87 %-99 %] 96 % (12/31 1500) Arterial Line BP: (144-200)/(49-75) 177/70 mmHg (12/31 1000)  CBC:   Recent Labs Lab 02/08/15 1515 02/09/15 0425  WBC 5.7 5.5  NEUTROABS 3.9 3.4  HGB 13.0 12.9*  HCT 41.3 41.0  MCV 94.5 94.3  PLT 106* 108*    Basic Metabolic Panel:   Recent Labs Lab 02/08/15 0551 02/09/15 0425  NA 144 147*  K 4.0 3.8  CL 109 112*  CO2 28 28  GLUCOSE 124* 110*  BUN 8 <5*  CREATININE 0.82 0.70  CALCIUM 9.6 9.2    Lipid Panel:     Component Value Date/Time   CHOL 231* 02/06/2015 1830   TRIG 172* 02/06/2015 1830   HDL 47 02/06/2015 1830   CHOLHDL 4.9 02/06/2015 1830   VLDL 34 02/06/2015 1830   LDLCALC 150* 02/06/2015 1830   HgbA1c:  Lab Results  Component Value Date   HGBA1C 6.7* 02/06/2015   Urine Drug Screen:     Component Value Date/Time   LABOPIA NONE DETECTED 02/07/2015 2053   COCAINSCRNUR NONE DETECTED 02/07/2015 2053   LABBENZ NONE DETECTED 02/07/2015 2053   AMPHETMU NONE DETECTED 02/07/2015 2053   THCU NONE DETECTED 02/07/2015 2053   LABBARB NONE DETECTED 02/07/2015 2053      IMAGING I have personally reviewed the radiological images below and agree with the radiology interpretations.  Dg Chest 2 View 02/06/2015   No active cardiopulmonary disease.   Ct Head Wo Contrast 02/06/2015   Chronic small vessel disease throughout the deep white matter. No acute intracranial abnormality.   Mr Brain Wo Contrast 02/06/2015   1.  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 2. Chronic poor flow in the right ICA   TTE 07/2014 - - Left ventricle: The cavity size was normal. There was mild concentric hypertrophy. Systolic function was normal. The estimated ejection fraction was in the range of 50% to 55%. Wall motion was normal; there were no regional wall motion abnormalities. Doppler parameters are consistent with abnormal left ventricular relaxation (grade 1 diastolic dysfunction). - Left atrium: The atrium was moderately dilated.  Cerebral angio - S/P bilateral common carotid and Rt Vert artery angiograms followed by endovascular complete revascularization of occluded RT ICA supraclinoid segment With stent assisted angioplasty and of severe prox rt ICA stenosis with stenting   PHYSICAL EXAM  Temp:  [98 F (36.7 C)-98.8 F (37.1 C)] 98.3 F (36.8 C) (12/31 1132) Pulse Rate:  [63-103] 85 (12/31 1500) Resp:  [13-31] 25 (12/31 1500) BP: (103-164)/(53-95) 135/60 mmHg (12/31 1500) SpO2:  [87 %-99 %] 96 % (12/31 1500) Arterial Line BP: (144-200)/(49-75) 177/70 mmHg (12/31 1000)  General - Well nourished, well developed, in no apparent distress.  Ophthalmologic - Fundi not visualized due to eye movement.  Cardiovascular - Regular rate and rhythm with no murmur.  Mental Status -  Level of arousal and orientation to time, place, and person  were intact. Language including expression, naming, repetition, comprehension was assessed and found intact, but moderate to severe dysarthria. Fund of Knowledge was assessed and was impaired.  Cranial Nerves II - XII - II - Visual field intact OU. III, IV, VI - Extraocular movements intact. V - Facial sensation intact bilaterally. VII - left facial droop. VIII - Hearing & vestibular intact bilaterally. X - Palate elevates symmetrically, moderate to severe dysarthria. XI - Chin turning & shoulder shrug intact  bilaterally. XII - Tongue protrusion intact.  Motor Strength - The patient's strength was LUE proximal 3+/5 and bicept and tricep 3/5, hand grip 3+/5, LLE proximal 3/5 and distal 3/5. RUE and RLE 5/5. Bulk was normal and fasciculations were absent.   Motor Tone - Muscle tone was assessed at the neck and appendages and was normal.  Reflexes - The patient's reflexes were 1+ in all extremities and he had no pathological reflexes.  Sensory - Light touch, temperature/pinprick were assessed and were symmetrical.    Coordination - The patient had normal movements in the right hand with no ataxia or dysmetria.  Tremor was absent.  Gait and Station - not tested due to weakness.   ASSESSMENT/PLAN Mr. KEATEN MASHEK is a 64 y.o. male with history of previous R brain strokes presenting with left sided weakness. He did not receive IV t-PA due to delay in arrival.   Stroke:  Recurrent scattered right MCA and MCA/ACA watershed infarcts felt to be secondary to known R ICA supraclinoid near occlusion. Hx of stroke all in the right anterior circulation due to right ICA stenosis.   MRI  scattered right MCA and MCA/ACA watershed infarcts   Cerebral angio s/p R ICA supraclinoid and proximal stenting  LDL 150  HgbA1c 6.7  SCDs for VTE prophylaxis Diet Heart Room service appropriate?: Yes; Fluid consistency:: Thin  aspirin 325 mg daily and clopidogrel 75 mg daily prior to admission, now on aspirin 325 mg daily and clopidogrel 75 mg daily. Continue dual antiplatelet for at least 6 months due to stenting.  Ongoing aggressive stroke risk factor management  Therapy recommendations:  CIR  Disposition:  pending   Hx stroke/TIA   August 01, 2014  right frontal lobe watershed infarcts with hypotension in setting of R paraclinoid ICA stenosis   August 19, 2014  right posterior frontal infarct secondary to hypotension in setting of severe R paraclinoid stenosis  August 26, 2014  right corpus callosum  infarcts in setting of R frontal infarcts in June, and new right frontal at early July, felt to be watershed in setting of hypotension due to R ICA supraclinoid near occlusion, though could not rule out embolic source.  PVD  Follow up with Dr. Scot Dock as outpt  Planning for redo right femoral to below knee popliteal artery bypass  Dr. Scot Dock may want to see him during this admission  Hypertension Stable BP goal 120-140 s/p procedure Keep BP < 160 Off cardene and put back on home meds with coreg and lasix  Hyperlipidemia  Home meds:  lipitor 40, resumed in hospital  LDL 150, goal < 70  Increase lipitor to '80mg'$   Continue statin at discharge  Tobacco abuse  Current smoker  Smoking cessation counseling provided  Pt is not firm on quiting  No nicotin patch provided as pt likely continue to smoke after discharge  Other Stroke Risk Factors  Advanced age  Marijuana use  ETOH use  Hx cocaine use w/ benzo and opiates, UDS positive in July 2016.  Have ordered UDS this admission  Obesity, Body mass index is 34.2 kg/(m^2).   Coronary artery disease - MI  Obstructive sleep apnea, on CPAP at home  Hx CHF  Hx of DVT  Hx DVT, PVL in 02/2014 significant for RLE DVT involving the popliteal vein, treated with xarelto, noncompliant - had stopped d/t rectal bleeding prior to last admission, never resumed, though instructed. Refused colonoscopy in hospital.  As per note, his 02/2014 DVT is the second DVT  However, pt is not compliance with [facility] and stated that he had GIB in the past with [facility].   In July 2016, Due to noncompliance with medication, illicit drug use and GIB on [facility], did not recommend to resume Xarelto   hypercoagulable panel / vasculitic labs completed in the past  Neg for DVT during previous admission  Other Active Problems  [facility] day # 3  Rosalin Hawking, MD PhD Stroke Neurology 02/09/2015 3:59 PM      To contact Stroke Continuity provider,  please refer to [email or web address] After hours, contact General Neurology

## 2015-02-10 LAB — BASIC METABOLIC PANEL
Anion gap: 6 (ref 5–15)
BUN: 11 mg/dL (ref 6–20)
CO2: 30 mmol/L (ref 22–32)
CREATININE: 0.9 mg/dL (ref 0.61–1.24)
Calcium: 9.7 mg/dL (ref 8.9–10.3)
Chloride: 108 mmol/L (ref 101–111)
Glucose, Bld: 116 mg/dL — ABNORMAL HIGH (ref 65–99)
POTASSIUM: 3.8 mmol/L (ref 3.5–5.1)
SODIUM: 144 mmol/L (ref 135–145)

## 2015-02-10 MED ORDER — GABAPENTIN 100 MG PO CAPS
100.0000 mg | ORAL_CAPSULE | Freq: Three times a day (TID) | ORAL | Status: DC
  Administered 2015-02-10 – 2015-02-11 (×4): 100 mg via ORAL
  Filled 2015-02-10 (×4): qty 1

## 2015-02-10 NOTE — Progress Notes (Signed)
Physical Therapy Treatment Patient Details Name: [name] MRN: [id number] DOB: 1950/04/01 Today's Date: 02/10/2015    History of Present Illness Pt is a 65 y/o male with a PMH of CVA in the right MCA distribution with right MCA stenosis who presents with recurrent strokes in this distribution. He states that 2 nights ago he had left-sided weakness and numbness which then subsequently improved after approximately 3 hours. On 12/27, he had progressive worsening of the symptoms again which did not improve. He subsequently sought care in the ED which shows recurrent infarcts in the watershed distributions of the right MCA territory.  Pt is now s/p angiogram w/ stenting of Rt ICA.     PT Comments    Mr. Poorman demonstrated excellent progress w/ ambulatory endurance this session.  Currently requiring min assist and use of RW.  Improvement noted in Lt LE strength.  Continue to recommend CIR at d/c to increase pt's functional independence and safety.   Follow Up Recommendations  CIR;Supervision/Assistance - 24 hour     Equipment Recommendations  Other (comment) (TBD at next venue of care)    Recommendations for Other Services       Precautions / Restrictions Precautions Precautions: Fall Restrictions Weight Bearing Restrictions: No    Mobility  Bed Mobility Overal bed mobility: Needs Assistance Bed Mobility: Supine to Sit     Supine to sit: Min guard     General bed mobility comments: Increased time  Transfers Overall transfer level: Needs assistance Equipment used: Rolling walker (2 wheeled) Transfers: Sit to/from Stand Sit to Stand: Min assist         General transfer comment: Assist to boost up to standing and cues for hand placement using RW.    Ambulation/Gait Ambulation/Gait assistance: Min assist Ambulation Distance (Feet): 60 Feet Assistive device: Rolling walker (2 wheeled) Gait Pattern/deviations: Step-through pattern;Decreased stride  length;Shuffle;Leaning posteriorly;Trunk flexed   Gait velocity interpretation: Below normal speed for age/gender General Gait Details: Some shuffling, flexed trunk, cues to stand upright and cues for proper management of RW.  Pt fatigues after ambulating ~40 ft.    Stairs            Wheelchair Mobility    Modified Rankin (Stroke Patients Only) Modified Rankin (Stroke Patients Only) Pre-Morbid Rankin Score: No symptoms Modified Rankin: Moderately severe disability     Balance Overall balance assessment: Needs assistance Sitting-balance support: Feet supported;No upper extremity supported Sitting balance-Leahy Scale: Good     Standing balance support: Bilateral upper extremity supported;During functional activity Standing balance-Leahy Scale: Poor Standing balance comment: Relies on RW for support                    Cognition Arousal/Alertness: Awake/alert Behavior During Therapy: WFL for tasks assessed/performed Overall Cognitive Status: Within Functional Limits for tasks assessed                      Exercises General Exercises - Upper Extremity Shoulder Flexion: AROM;Both;10 reps;Seated;Limitations Shoulder Flexion Limitations: Lt shoulder flexion limited to ~100 deg General Exercises - Lower Extremity Ankle Circles/Pumps: AROM;Both;10 reps;Seated Long Arc Quad: AROM;Both;10 reps;Seated    General Comments General comments (skin integrity, edema, etc.): Pt demonstrates improvement in Lt LE strength.  Grossly 4/5 w/ MMT.      Pertinent Vitals/Pain Pain Assessment: Faces Faces Pain Scale: Hurts little more Pain Location: Rt LE w/ DF MMT Pain Descriptors / Indicators: Sharp Pain Intervention(s): Limited activity within patient's tolerance;Monitored during session  Home Living                      Prior Function            PT Goals (current goals can now be found in the care plan section) Acute Rehab PT Goals Patient Stated  Goal: Get stronger PT Goal Formulation: With patient Time For Goal Achievement: 02/21/15 Potential to Achieve Goals: Good Progress towards PT goals: Progressing toward goals    Frequency  Min 4X/week    PT Plan Current plan remains appropriate    Co-evaluation             End of Session Equipment Utilized During Treatment: Gait belt Activity Tolerance: Patient limited by fatigue Patient left: in chair;with call bell/phone within reach;with chair alarm set     Time: 1840-3754 PT Time Calculation (min) (ACUTE ONLY): 21 min  Charges:  $Gait Training: 8-22 mins                    G Codes:      Joslyn Hy PT, [place] [phone] Pager: [phone] 02/10/2015, 3:12 PM

## 2015-02-10 NOTE — Progress Notes (Signed)
PATIENT DETAILS Name: [name] Age: 65 y.o. Sex: male Date of Birth: [birth year] Admit Date: 02/06/2015 Admitting Physician Waldemar Dickens, MD GBE:[id number], [name], MD  Subjective: No major complaints-seems to have more left sided strength compared to yesterday  Assessment/Plan: Principal Problem: Recurrent right MCA territory infarct: Secondary to known right ICA supraclinoid near occlusion. Seen by neurology, recommendations were for cerebral angiogram and stenting.Interventional radiology was consulted, patient underwent catheter guided cerebral angiogram with stenting of RT ICA supraclinoid segment on 12/30. Continue aspirin/Plavix and statin. Much more strenght in LUE/LLE-approximately 4/5. Awaiting CIR bed.  Active Problems: Hypertension: Moderately Controlled, continue Coreg. Follow   Dyslipidemia: Continue Lipitor, LDL- 150 (goal<70)  COPD: Lungs clear, continue current bronchodilator regimen.  History of CAD: No chest pain or shortness of breath, continue antiplatelets, statin,Coreg. Most recent nuclear stress test in October 2016 negative for ischemia.  History of peripheral vascular disease: Followed by vascular surgery-plans are to redo right femoral to below knee popliteal bypass-tentatively scheduled for 02/19/2015.  History of DVT: Previously on xarelto-this was discontinued because of noncompliance  GERD:PPI  Polysubstance abuse: Previous urinary drug screen is positive for cocaine/benzo/opiates. Urine drug screen pending this admission.  RFX:JOIT qhs  Disposition: Remain inpatient-CIR when bed available  Antimicrobial agents  See below  Anti-infectives    Start     Dose/Rate Route Frequency Ordered Stop   02/08/15 0845  ceFAZolin (ANCEF) IVPB 2 g/50 mL premix     2 g 100 mL/hr over 30 Minutes Intravenous To Radiology 02/07/15 1244 02/08/15 0913      DVT Prophylaxis: Lovenox  Code Status: Full code   Family  Communication None at bedside  Procedures: None  CONSULTS:  neurology and IR  Time spent 25 minutes-Greater than 50% of this time was spent in counseling, explanation of diagnosis, planning of further management, and coordination of care.  MEDICATIONS: Scheduled Meds: . aspirin  325 mg Oral Q breakfast  . atorvastatin  80 mg Oral q1800  . carvedilol  12.5 mg Oral BID WC  . clopidogrel  75 mg Oral Q breakfast  . enoxaparin (LOVENOX) injection  40 mg Subcutaneous Q24H  . folic acid  1 mg Oral Daily  . furosemide  40 mg Oral BID  . gatifloxacin  1 drop Left Eye QID  . mometasone-formoterol  2 puff Inhalation BID  . multivitamin with minerals  1 tablet Oral Daily  . pantoprazole  40 mg Oral Daily  . thiamine  100 mg Oral Daily  . tiotropium  18 mcg Inhalation Daily   Continuous Infusions:   PRN Meds:.acetaminophen **OR** acetaminophen, albuterol, diclofenac sodium, methocarbamol, ondansetron (ZOFRAN) IV, senna-docusate, tiZANidine, traMADol    PHYSICAL EXAM: Vital signs in last 24 hours: Filed Vitals:   02/10/15 0119 02/10/15 0638 02/10/15 0943 02/10/15 0952  BP: 105/46 139/64 153/69   Pulse: 86 85 84   Temp: 98.9 F (37.2 C) 97.9 F (36.6 C) 98 F (36.7 C)   TempSrc: Oral Oral Oral   Resp: '20 20 20   '$ Height:      Weight:      SpO2: 93% 95% 95% 92%    Weight change:  Filed Weights   02/07/15 0005 02/08/15 1500  Weight: 116.121 kg (256 lb) 114.4 kg (252 lb 3.3 oz)   Body mass index is 34.2 kg/(m^2).   Gen Exam: Awake and alert with clear speech.   Neck: Supple, No JVD.  Chest: B/L Clear.   CVS: S1 S2 Regular, no murmurs.  Abdomen: soft, BS +, non tender, non distended.  Extremities: no edema, lower extremities warm to touch. Neurologic: Left sided weakness-4/5   Skin: No Rash.   Wounds: N/A.    Intake/Output from previous day:  Intake/Output Summary (Last 24 hours) at 02/10/15 1355 Last data filed at 02/10/15 0639  Gross per 24 hour  Intake      0  ml  Output   2660 ml  Net  -2660 ml     LAB RESULTS: CBC  Recent Labs Lab 02/06/15 1310 02/08/15 0551 02/08/15 1515 02/09/15 0425  WBC 5.3 5.4 5.7 5.5  HGB 15.6 14.8 13.0 12.9*  HCT 47.2 45.5 41.3 41.0  PLT 125* 114* 106* 108*  MCV 93.8 94.6 94.5 94.3  MCH 31.0 30.8 29.7 29.7  MCHC 33.1 32.5 31.5 31.5  RDW 14.2 14.3 14.4 14.4  LYMPHSABS 1.5 1.4 1.1 1.2  MONOABS 0.7 0.7 0.6 0.7  EOSABS 0.2 0.3 0.1 0.2  BASOSABS 0.0 0.0 0.0 0.0    Chemistries   Recent Labs Lab 02/06/15 1310 02/08/15 0551 02/09/15 0425 02/10/15 0530  NA 145 144 147* 144  K 4.3 4.0 3.8 3.8  CL 110 109 112* 108  CO2 '25 28 28 30  '$ GLUCOSE 115* 124* 110* 116*  BUN 9 8 <5* 11  CREATININE 0.92 0.82 0.70 0.90  CALCIUM 10.0 9.6 9.2 9.7    CBG:  Recent Labs Lab 02/08/15 0820  GLUCAP 118*    GFR Estimated Creatinine Clearance: 106.8 mL/min (by C-G formula based on Cr of 0.9).  Coagulation profile  Recent Labs Lab 02/07/15 0958 02/08/15 0551  INR 1.08 1.07    Cardiac Enzymes No results for input(s): CKMB, TROPONINI, MYOGLOBIN in the last 168 hours.  Invalid input(s): CK  Invalid input(s): POCBNP No results for input(s): DDIMER in the last 72 hours. No results for input(s): HGBA1C in the last 72 hours. No results for input(s): CHOL, HDL, LDLCALC, TRIG, CHOLHDL, LDLDIRECT in the last 72 hours. No results for input(s): TSH, T4TOTAL, T3FREE, THYROIDAB in the last 72 hours.  Invalid input(s): FREET3 No results for input(s): VITAMINB12, FOLATE, FERRITIN, TIBC, IRON, RETICCTPCT in the last 72 hours. No results for input(s): LIPASE, AMYLASE in the last 72 hours.  Urine Studies No results for input(s): UHGB, CRYS in the last 72 hours.  Invalid input(s): UACOL, UAPR, USPG, UPH, UTP, UGL, UKET, UBIL, UNIT, UROB, ULEU, UEPI, UWBC, URBC, UBAC, CAST, UCOM, BILUA  MICROBIOLOGY: Recent Results (from the past 240 hour(s))  Surgical pcr screen     Status: None   Collection Time: 02/07/15  1:03  AM  Result Value Ref Range Status   MRSA, PCR NEGATIVE NEGATIVE Final   Staphylococcus aureus NEGATIVE NEGATIVE Final    Comment:        The Xpert SA Assay (FDA approved for NASAL specimens in patients over 6 years of age), is one component of a comprehensive surveillance program.  Test performance has been validated by [facility] for patients greater than or equal to 67 year old. It is not intended to diagnose infection nor to guide or monitor treatment.     RADIOLOGY STUDIES/RESULTS: Dg Chest 2 View  02/06/2015  CLINICAL DATA:  CVA. EXAM: CHEST  2 VIEW COMPARISON:  08/19/2014 chest radiograph. FINDINGS: Stable cardiomediastinal silhouette with normal heart size. No pneumothorax. No pleural effusion. Clear lungs, with no focal lung consolidation and no pulmonary edema. IMPRESSION: No active cardiopulmonary disease.  Electronically Signed   By: Ilona Sorrel M.D.   On: 02/06/2015 21:54   Ct Head Wo Contrast  02/06/2015  CLINICAL DATA:  Left-sided weakness for 2-3 days. History of multiple strokes. EXAM: CT HEAD WITHOUT CONTRAST TECHNIQUE: Contiguous axial images were obtained from the base of the skull through the vertex without intravenous contrast. COMPARISON:  08/27/2014 FINDINGS: Mild chronic small vessel disease throughout the deep white matter. No acute intracranial abnormality. Specifically, no hemorrhage, hydrocephalus, mass lesion, acute infarction, or significant intracranial injury. No acute calvarial abnormality. Opacified left maxillary sinus is stable since prior study. Postoperative changes in the left anterior maxillary wall and floor the left orbit. Mastoid air cells are clear. IMPRESSION: Chronic small vessel disease throughout the deep white matter. No acute intracranial abnormality. Electronically Signed   By: Rolm Baptise M.D.   On: 02/06/2015 12:42   Mr Brain Wo Contrast  02/06/2015  CLINICAL DATA:  65 year old male with left side weakness beginning 2 days ago.  Initial encounter. EXAM: MRI HEAD WITHOUT CONTRAST TECHNIQUE: Multiplanar, multiecho pulse sequences of the brain and surrounding structures were obtained without intravenous contrast. COMPARISON:  Head CT without contrast 1233 hours today. Brain MRI 08/27/2014. FINDINGS: Chronically abnormal appearance of the right ICA siphon, but with suggestion of reconstituted flow at the right ICA terminus. Overall Major intracranial vascular flow voids are stable since July. Patchy mostly cortically based restricted diffusion in the right superior frontal gyrus involving the MCA territory (some MCA/ACA watershed area in the pre motor region). White matter extension to the right corona radiata on series 4, image 18. No contralateral left hemisphere or posterior fossa restricted diffusion. Associated patchy T2 and FLAIR hyperintensity in the acutely affected areas. No definite associated acute hemorrhage, although there is probably vascular related T2* asymmetry along the medial right hemisphere (series 8, images 16-19) which is new since July. Chronic micro hemorrhage in the right occipital lobe is unchanged. Widespread scattered cerebral white matter T2 and FLAIR hyperintensity elsewhere in both hemispheres, greater on the right, then is stable since July. No midline shift, mass effect, evidence of mass lesion, ventriculomegaly, extra-axial collection or acute intracranial hemorrhage. Cervicomedullary junction and pituitary are within normal limits. Negative visualized cervical spine. Stable paranasal sinuses and mastoids. Interval postoperative changes to the left globe, otherwise stable orbit and scalp soft tissues. Visualized bone marrow signal is within normal limits. IMPRESSION: 1. 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 2. Chronic poor flow in the right ICA - See also 08/27/2014 Head MRA report. Electronically Signed   By: Genevie Ann M.D.   On: 02/06/2015 16:19     Oren Binet, MD  [facility] Pager:[phone]  If 7PM-7AM, please contact night-coverage [email or web address] Password [email or web address] 02/10/2015, 1:55 PM   LOS: 4 days

## 2015-02-10 NOTE — Progress Notes (Signed)
STROKE TEAM PROGRESS NOTE   SUBJECTIVE (INTERVAL HISTORY) No family is at the bedside.  He is doing well and left UE and LE continue to improved. No complains. Waiting for CIR.  OBJECTIVE Temp:  [97.9 F (36.6 C)-98.9 F (37.2 C)] 98.4 F (36.9 C) (01/01 1357) Pulse Rate:  [78-90] 78 (01/01 1357) Cardiac Rhythm:  [-] Normal sinus rhythm (01/01 0740) Resp:  [20-27] 20 (01/01 1357) BP: (105-153)/(46-80) 135/76 mmHg (01/01 1357) SpO2:  [92 %-97 %] 97 % (01/01 1357)  CBC:   Recent Labs Lab 02/08/15 1515 02/09/15 0425  WBC 5.7 5.5  NEUTROABS 3.9 3.4  HGB 13.0 12.9*  HCT 41.3 41.0  MCV 94.5 94.3  PLT 106* 108*    Basic Metabolic Panel:   Recent Labs Lab 02/09/15 0425 02/10/15 0530  NA 147* 144  K 3.8 3.8  CL 112* 108  CO2 28 30  GLUCOSE 110* 116*  BUN <5* 11  CREATININE 0.70 0.90  CALCIUM 9.2 9.7    Lipid Panel:     Component Value Date/Time   CHOL 231* 02/06/2015 1830   TRIG 172* 02/06/2015 1830   HDL 47 02/06/2015 1830   CHOLHDL 4.9 02/06/2015 1830   VLDL 34 02/06/2015 1830   LDLCALC 150* 02/06/2015 1830   HgbA1c:  Lab Results  Component Value Date   HGBA1C 6.7* 02/06/2015   Urine Drug Screen:     Component Value Date/Time   LABOPIA NONE DETECTED 02/07/2015 2053   COCAINSCRNUR NONE DETECTED 02/07/2015 2053   LABBENZ NONE DETECTED 02/07/2015 2053   AMPHETMU NONE DETECTED 02/07/2015 2053   THCU NONE DETECTED 02/07/2015 2053   LABBARB NONE DETECTED 02/07/2015 2053      IMAGING I have personally reviewed the radiological images below and agree with the radiology interpretations.  Dg Chest 2 View 02/06/2015   No active cardiopulmonary disease.   Ct Head Wo Contrast 02/06/2015   Chronic small vessel disease throughout the deep white matter. No acute intracranial abnormality.   Mr Brain Wo Contrast 02/06/2015   1. 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 2. Chronic poor flow in the right  ICA   TTE 07/2014 - - Left ventricle: The cavity size was normal. There was mild concentric hypertrophy. Systolic function was normal. The estimated ejection fraction was in the range of 50% to 55%. Wall motion was normal; there were no regional wall motion abnormalities. Doppler parameters are consistent with abnormal left ventricular relaxation (grade 1 diastolic dysfunction). - Left atrium: The atrium was moderately dilated.  Cerebral angio - S/P bilateral common carotid and Rt Vert artery angiograms followed by endovascular complete revascularization of occluded RT ICA supraclinoid segment With stent assisted angioplasty and of severe prox rt ICA stenosis with stenting   PHYSICAL EXAM  Temp:  [97.9 F (36.6 C)-98.9 F (37.2 C)] 98.4 F (36.9 C) (01/01 1357) Pulse Rate:  [78-90] 78 (01/01 1357) Resp:  [20-27] 20 (01/01 1357) BP: (105-153)/(46-80) 135/76 mmHg (01/01 1357) SpO2:  [92 %-97 %] 97 % (01/01 1357)  General - Well nourished, well developed, in no apparent distress.  Ophthalmologic - Fundi not visualized due to eye movement.  Cardiovascular - Regular rate and rhythm with no murmur.  Mental Status -  Level of arousal and orientation to time, place, and person were intact. Language including expression, naming, repetition, comprehension was assessed and found intact, but moderate to severe dysarthria. Fund of Knowledge was assessed and was impaired.  Cranial Nerves  II - XII - II - Visual field intact OU. III, IV, VI - Extraocular movements intact. V - Facial sensation intact bilaterally. VII - left facial droop. VIII - Hearing & vestibular intact bilaterally. X - Palate elevates symmetrically, moderate to severe dysarthria. XI - Chin turning & shoulder shrug intact bilaterally. XII - Tongue protrusion intact.  Motor Strength - The patient's strength was LUE proximal 4/5 and bicept and tricep 4/5, hand grip 3+/5, LLE proximal 4/5 and distal 3+/5. RUE and  RLE 5/5. Bulk was normal and fasciculations were absent.   Motor Tone - Muscle tone was assessed at the neck and appendages and was normal.  Reflexes - The patient's reflexes were 1+ in all extremities and he had no pathological reflexes.  Sensory - Light touch, temperature/pinprick were assessed and were symmetrical.    Coordination - The patient had normal movements in the right hand with no ataxia or dysmetria.  Tremor was absent.  Gait and Station - not tested due to weakness.   ASSESSMENT/PLAN [name] is a 65 y.o. male with history of previous R brain strokes presenting with left sided weakness. He did not receive IV t-PA due to delay in arrival.   Stroke:  Recurrent scattered right MCA and MCA/ACA watershed infarcts felt to be secondary to known R ICA supraclinoid near occlusion. Hx of stroke all in the right anterior circulation due to right ICA stenosis.   MRI  scattered right MCA and MCA/ACA watershed infarcts   Cerebral angio s/p R supraclinoid ICA and proximal ICA stenting  LDL 150  HgbA1c 6.7  SCDs for VTE prophylaxis Diet Heart Room service appropriate?: Yes; Fluid consistency:: Thin  aspirin 325 mg daily and clopidogrel 75 mg daily prior to admission, now on aspirin 325 mg daily and clopidogrel 75 mg daily. Continue dual antiplatelet for at least 6 months due to stenting.  Ongoing aggressive stroke risk factor management  Therapy recommendations:  CIR  Disposition:  pending   Hx stroke/TIA   August 01, 2014  right frontal lobe watershed infarcts with hypotension in setting of R paraclinoid ICA stenosis   August 19, 2014  right posterior frontal infarct secondary to hypotension in setting of severe R paraclinoid stenosis  August 26, 2014  right corpus callosum infarcts in setting of R frontal infarcts in June, and new right frontal at early July, felt to be watershed in setting of hypotension due to R ICA supraclinoid near occlusion, though could not  rule out embolic source.  PVD  Follow up with Dr. Scot Rangel as outpt  Planning for redo right femoral to below knee popliteal artery bypass  Dr. Scot Rangel may want to see him during this admission  Hypertension Stable BP goal 120-140 s/p procedure Keep BP < 160 Off cardene and put back on home meds with coreg and lasix  Hyperlipidemia  Home meds:  lipitor 40, resumed in hospital  LDL 150, goal < 70  Increase lipitor to '80mg'$   Continue statin at discharge  Tobacco abuse  Current smoker  Smoking cessation counseling provided  Pt is not firm on quiting  No nicotin patch provided as pt likely continue to smoke after discharge  Other Stroke Risk Factors  Advanced age  Marijuana use  ETOH use  Hx cocaine use w/ benzo and opiates, UDS positive in July 2016. Have ordered UDS this admission  Obesity, Body mass index is 34.2 kg/(m^2).   Coronary artery disease - MI  Obstructive sleep apnea, on CPAP at home  Hx CHF  Hx of DVT  Hx DVT, PVL in 02/2014 significant for RLE DVT involving the popliteal vein, treated with xarelto, noncompliant - had stopped d/t rectal bleeding prior to last admission, never resumed, though instructed. Refused colonoscopy in hospital.  As per note, his 02/2014 DVT is the second DVT  However, pt is not compliance with [facility] and stated that he had GIB in the past with [facility].   In July 2016, Due to noncompliance with medication, illicit drug use and GIB on [facility], did not recommend to resume Xarelto   hypercoagulable panel / vasculitic labs completed in the past  Neg for DVT during previous admission  Other Active Problems  [facility] day # 4  Neurology will sign off. Please call with questions. Pt will follow up with Dr. Erlinda Hong at [facility] in about 2 months. Thanks for the consult.  Rosalin Hawking, MD PhD Stroke Neurology 02/10/2015 3:26 PM      To contact Stroke Continuity provider, please refer to [email or web address] After hours, contact General  Neurology

## 2015-02-10 NOTE — Progress Notes (Signed)
Referring Physician(s): Dr Erlinda Hong  Chief Complaint:  R ICA stenosis CVA  Subjective:  R ICA angioplasty/stent 12/30 in IR with Dr Estanislado Pandy Pt has done well Denies headache Denies vision or speech problems Doing well Eating and sleeping we  Only complaint is feet pain  Allergies: Darvocet; Haldol; Acetaminophen; and Norco  Medications: Prior to Admission medications   Medication Sig Start Date End Date Taking? Authorizing Provider  albuterol (PROVENTIL HFA;VENTOLIN HFA) 108 (90 BASE) MCG/ACT inhaler Inhale 2 puffs into the lungs every 6 (six) hours as needed for wheezing or shortness of breath. 01/29/15  Yes Arnoldo Morale, MD  albuterol (PROVENTIL) (2.5 MG/3ML) 0.083% nebulizer solution Take 3 mLs (2.5 mg total) by nebulization 2 (two) times daily as needed for wheezing or shortness of breath. 01/16/15  Yes Arnoldo Morale, MD  aspirin 325 MG EC tablet Take 1 tablet (325 mg total) by mouth daily. Patient taking differently: Take 325 mg by mouth every 6 (six) hours as needed for pain.  01/16/15  Yes Arnoldo Morale, MD  aspirin EC 81 MG tablet Take 81 mg by mouth daily.   Yes Historical Provider, MD  atorvastatin (LIPITOR) 40 MG tablet Take 1 tablet (40 mg total) by mouth daily at 6 PM. 01/16/15  Yes Arnoldo Morale, MD  carvedilol (COREG) 6.25 MG tablet Take 1 tablet (6.25 mg total) by mouth 2 (two) times daily with a meal. 01/29/15  Yes Arnoldo Morale, MD  clopidogrel (PLAVIX) 75 MG tablet Take 1 tablet (75 mg total) by mouth daily. 01/29/15  Yes Arnoldo Morale, MD  furosemide (LASIX) 40 MG tablet Take 1 tablet (40 mg total) by mouth 2 (two) times daily. 01/29/15  Yes Arnoldo Morale, MD  mometasone-formoterol (DULERA) 200-5 MCG/ACT AERO Inhale 2 puffs into the lungs 2 (two) times daily. 01/16/15  Yes Arnoldo Morale, MD  omeprazole (PRILOSEC) 20 MG capsule Take 1 capsule (20 mg total) by mouth daily. 01/16/15  Yes Arnoldo Morale, MD  tiotropium (SPIRIVA) 18 MCG inhalation capsule Place 1 capsule (18 mcg  total) into inhaler and inhale daily. 01/29/15  Yes Arnoldo Morale, MD  tiZANidine (ZANAFLEX) 4 MG tablet Take 1 tablet (4 mg total) by mouth 3 (three) times daily. Patient taking differently: Take 4 mg by mouth every 8 (eight) hours as needed for muscle spasms.  01/16/15  Yes Arnoldo Morale, MD  Besifloxacin HCl (BESIVANCE) 0.6 % SUSP Place 1 drop into the left eye 2 (two) times daily.    Historical Provider, MD  Difluprednate (DUREZOL) 0.05 % EMUL Place 1 drop into the left eye 2 (two) times daily.    Historical Provider, MD  metoprolol tartrate (LOPRESSOR) 25 MG tablet Take 1 tablet (25 mg total) by mouth 2 (two) times daily. Patient not taking: Reported on 02/06/2015 01/16/15   Arnoldo Morale, MD  traMADol (ULTRAM) 50 MG tablet Take 1 tablet (50 mg total) by mouth every 8 (eight) hours as needed. Patient not taking: Reported on 02/06/2015 12/28/14   Arnoldo Morale, MD     Vital Signs: BP 139/64 mmHg  Pulse 85  Temp(Src) 97.9 F (36.6 C) (Oral)  Resp 20  Ht 6' (1.829 m)  Wt 252 lb 3.3 oz (114.4 kg)  BMI 34.20 kg/m2  SpO2 95%  Physical Exam  Constitutional: He appears well-nourished.  Cardiovascular: Normal rate and regular rhythm.   Pulmonary/Chest: Effort normal and breath sounds normal. He has no wheezes.  Abdominal: Soft.  Musculoskeletal: Normal range of motion.  Moves all 4s Left still sl weaker---better  though  Neurological: He is alert.  Skin: Skin is warm.  Rt groin NT No bleeding No hematoma Rt foot 2+ pulses  Psychiatric: He has a normal mood and affect. His behavior is normal. Judgment and thought content normal.  Nursing note and vitals reviewed.   Imaging: Dg Chest 2 View  02/06/2015  CLINICAL DATA:  CVA. EXAM: CHEST  2 VIEW COMPARISON:  08/19/2014 chest radiograph. FINDINGS: Stable cardiomediastinal silhouette with normal heart size. No pneumothorax. No pleural effusion. Clear lungs, with no focal lung consolidation and no pulmonary edema. IMPRESSION: No active  cardiopulmonary disease. Electronically Signed   By: Ilona Sorrel M.D.   On: 02/06/2015 21:54   Ct Head Wo Contrast  02/06/2015  CLINICAL DATA:  Left-sided weakness for 2-3 days. History of multiple strokes. EXAM: CT HEAD WITHOUT CONTRAST TECHNIQUE: Contiguous axial images were obtained from the base of the skull through the vertex without intravenous contrast. COMPARISON:  08/27/2014 FINDINGS: Mild chronic small vessel disease throughout the deep white matter. No acute intracranial abnormality. Specifically, no hemorrhage, hydrocephalus, mass lesion, acute infarction, or significant intracranial injury. No acute calvarial abnormality. Opacified left maxillary sinus is stable since prior study. Postoperative changes in the left anterior maxillary wall and floor the left orbit. Mastoid air cells are clear. IMPRESSION: Chronic small vessel disease throughout the deep white matter. No acute intracranial abnormality. Electronically Signed   By: Rolm Baptise M.D.   On: 02/06/2015 12:42   Mr Brain Wo Contrast  02/06/2015  CLINICAL DATA:  65 year old male with left side weakness beginning 2 days ago. Initial encounter. EXAM: MRI HEAD WITHOUT CONTRAST TECHNIQUE: Multiplanar, multiecho pulse sequences of the brain and surrounding structures were obtained without intravenous contrast. COMPARISON:  Head CT without contrast 1233 hours today. Brain MRI 08/27/2014. FINDINGS: Chronically abnormal appearance of the right ICA siphon, but with suggestion of reconstituted flow at the right ICA terminus. Overall Major intracranial vascular flow voids are stable since July. Patchy mostly cortically based restricted diffusion in the right superior frontal gyrus involving the MCA territory (some MCA/ACA watershed area in the pre motor region). White matter extension to the right corona radiata on series 4, image 18. No contralateral left hemisphere or posterior fossa restricted diffusion. Associated patchy T2 and FLAIR  hyperintensity in the acutely affected areas. No definite associated acute hemorrhage, although there is probably vascular related T2* asymmetry along the medial right hemisphere (series 8, images 16-19) which is new since July. Chronic micro hemorrhage in the right occipital lobe is unchanged. Widespread scattered cerebral white matter T2 and FLAIR hyperintensity elsewhere in both hemispheres, greater on the right, then is stable since July. No midline shift, mass effect, evidence of mass lesion, ventriculomegaly, extra-axial collection or acute intracranial hemorrhage. Cervicomedullary junction and pituitary are within normal limits. Negative visualized cervical spine. Stable paranasal sinuses and mastoids. Interval postoperative changes to the left globe, otherwise stable orbit and scalp soft tissues. Visualized bone marrow signal is within normal limits. IMPRESSION: 1. 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 2. Chronic poor flow in the right ICA - See also 08/27/2014 Head MRA report. Electronically Signed   By: Genevie Ann M.D.   On: 02/06/2015 16:19    Labs:  CBC:  Recent Labs  02/06/15 1310 02/08/15 0551 02/08/15 1515 02/09/15 0425  WBC 5.3 5.4 5.7 5.5  HGB 15.6 14.8 13.0 12.9*  HCT 47.2 45.5 41.3 41.0  PLT 125* 114* 106* 108*    COAGS:  Recent Labs  08/19/14 1823 08/26/14 1730 02/07/15 0958 02/08/15 0551  INR 1.09 1.01 1.08 1.07  APTT '28 27 28 28    '$ BMP:  Recent Labs  02/06/15 1310 02/08/15 0551 02/09/15 0425 02/10/15 0530  NA 145 144 147* 144  K 4.3 4.0 3.8 3.8  CL 110 109 112* 108  CO2 '25 28 28 30  '$ GLUCOSE 115* 124* 110* 116*  BUN 9 8 <5* 11  CALCIUM 10.0 9.6 9.2 9.7  CREATININE 0.92 0.82 0.70 0.90  GFRNONAA >60 >60 >60 >60  GFRAA >60 >60 >60 >60    LIVER FUNCTION TESTS:  Recent Labs  08/19/14 1823 08/26/14 1730 02/06/15 1310 02/08/15 0551  BILITOT 0.8 0.7 0.5 0.7  AST 33 29 16 13*  ALT 33 26  17 13*  ALKPHOS 70 69 66 63  PROT 7.3 7.4 6.2* 6.0*  ALBUMIN 4.1 4.0 3.7 3.4*    Assessment and Plan:  R ICA stenosis R ICA pta/stent 12/30 in IR Doing well Plan for recheck with Dr Estanislado Pandy 2 weeks---he will hear from scheduler with time and date  Signed: Deniece Rankin A 02/10/2015, 9:41 AM   I spent a total of 15 Minutes at the the patient's bedside AND on the patient's hospital floor or unit, greater than 50% of which was counseling/coordinating care for R ICA pta/stent

## 2015-02-11 DIAGNOSIS — I480 Paroxysmal atrial fibrillation: Secondary | ICD-10-CM

## 2015-02-11 LAB — CBC
HCT: 41.2 % (ref 39.0–52.0)
Hemoglobin: 13.2 g/dL (ref 13.0–17.0)
MCH: 30 pg (ref 26.0–34.0)
MCHC: 32 g/dL (ref 30.0–36.0)
MCV: 93.6 fL (ref 78.0–100.0)
PLATELETS: 132 10*3/uL — AB (ref 150–400)
RBC: 4.4 MIL/uL (ref 4.22–5.81)
RDW: 14.2 % (ref 11.5–15.5)
WBC: 6.5 10*3/uL (ref 4.0–10.5)

## 2015-02-11 MED ORDER — GABAPENTIN 100 MG PO CAPS
200.0000 mg | ORAL_CAPSULE | Freq: Three times a day (TID) | ORAL | Status: DC
  Administered 2015-02-11: 200 mg via ORAL
  Filled 2015-02-11: qty 2

## 2015-02-11 NOTE — Procedures (Signed)
Pt refuses the use of cpap.

## 2015-02-11 NOTE — Progress Notes (Signed)
Physical Therapy Treatment Patient Details Name: [name] MRN: [id number] DOB: 05-07-1950 Today's Date: 02/11/2015    History of Present Illness Pt is a 65 y/o male with a PMH of CVA in the right MCA distribution with right MCA stenosis who presents with recurrent strokes in this distribution. He states that 2 nights ago he had left-sided weakness and numbness which then subsequently improved after approximately 3 hours. On 12/27, he had progressive worsening of the symptoms again which did not improve. He subsequently sought care in the ED which shows recurrent infarcts in the watershed distributions of the right MCA territory.  Pt is now s/p angiogram w/ stenting of Rt ICA.     PT Comments    Patient limited by neuropathic pain in his feet (reports newly increased to 9/10 on pain scale) and Lt sided weakness. Requires assist to use RW. Will benefit from continued PT for incr independence and safety.  Follow Up Recommendations  CIR     Equipment Recommendations  Other (comment) (TBD at next venue of care)    Recommendations for Other Services Rehab consult     Precautions / Restrictions Precautions Precautions: Fall Restrictions Weight Bearing Restrictions: No    Mobility  Bed Mobility Overal bed mobility: Needs Assistance Bed Mobility: Supine to Sit     Supine to sit: Min guard;HOB elevated     General bed mobility comments: Increased time and guarding for safety; pt insisted on HOB elevated due to bil foot pain "if I'm going to do this (get OOB), then I'm going to keep HOB elevated"  Transfers Overall transfer level: Needs assistance Equipment used: Rolling walker (2 wheeled) Transfers: Sit to/from Stand Sit to Stand: Min assist         General transfer comment: Assist to boost up to standing, cues for hand placement using RW, and steady assist while transitioning hands to RW  Ambulation/Gait Ambulation/Gait assistance: Min assist Ambulation Distance  (Feet): 20 Feet Assistive device: Rolling walker (2 wheeled) Gait Pattern/deviations: Step-through pattern;Decreased stride length;Shuffle;Decreased dorsiflexion - left Gait velocity: Decreased Gait velocity interpretation: Below normal speed for age/gender General Gait Details: Some shuffling, flexed trunk, cues to stand upright and maintain LT hand on RW; cues for proper management of RW.     Stairs            Wheelchair Mobility    Modified Rankin (Stroke Patients Only) Modified Rankin (Stroke Patients Only) Pre-Morbid Rankin Score: No symptoms Modified Rankin: Moderately severe disability     Balance   Sitting-balance support: Feet supported Sitting balance-Leahy Scale: Fair     Standing balance support: Bilateral upper extremity supported Standing balance-Leahy Scale: Poor                      Cognition Arousal/Alertness: Awake/alert Behavior During Therapy: WFL for tasks assessed/performed Overall Cognitive Status: Within Functional Limits for tasks assessed                      Exercises      General Comments        Pertinent Vitals/Pain Pain Assessment: 0-10 Pain Score: 9  Pain Location: bil feet; neuropathic pain Pain Descriptors / Indicators: Burning;Stabbing Pain Intervention(s): Limited activity within patient's tolerance;Monitored during session;Repositioned    Home Living                      Prior Function  PT Goals (current goals can now be found in the care plan section) Acute Rehab PT Goals Patient Stated Goal: Get stronger Time For Goal Achievement: 02/21/15 Progress towards PT goals: Progressing toward goals    Frequency  Min 4X/week    PT Plan Current plan remains appropriate    Co-evaluation             End of Session Equipment Utilized During Treatment: Gait belt Activity Tolerance: Patient limited by pain Patient left: in chair;with call bell/phone within reach;with chair alarm  set     Time: 8295-6213 PT Time Calculation (min) (ACUTE ONLY): 19 min  Charges:  $Gait Training: 8-22 mins                    G Codes:      [name] 2015/02/26, 12:18 PM Pager [phone]

## 2015-02-11 NOTE — Progress Notes (Signed)
PATIENT DETAILS Name: [name] Age: 65 y.o. Sex: male Date of Birth: December 25, 1950 Admit Date: 02/06/2015 Admitting Physician Kenneth Dickens, MD [id number], Kenneth Song, MD  Subjective: No major complaints-awaiting CIR bed  Assessment/Plan: Principal Problem: Recurrent right MCA territory infarct: Secondary to known right ICA supraclinoid near occlusion. Seen by neurology, recommendations were for cerebral angiogram and stenting.Interventional radiology was consulted, patient underwent catheter guided cerebral angiogram with stenting of RT ICA supraclinoid segment on 12/30. Continue aspirin/Plavix and statin. Much more strenght in LUE/LLE-approximately 4/5. Awaiting CIR bed.  Active Problems: Hypertension: Controlled, continue Coreg. Follow   Dyslipidemia: Continue Lipitor, LDL- 150 (goal<70)  COPD: Lungs clear, continue current bronchodilator regimen.  History of CAD: No chest pain or shortness of breath, continue antiplatelets, statin,Coreg. Most recent nuclear stress test in October 2016 negative for ischemia.  History of peripheral vascular disease: Followed by vascular surgery-plans are to redo right femoral to below knee popliteal bypass-tentatively scheduled for 02/19/2015.  History of DVT: Previously on xarelto-this was discontinued because of noncompliance  GERD:PPI  Polysubstance abuse: Previous urinary drug screen is positive for cocaine/benzo/opiates. Urine drug screen pending this admission.  TSV:XBLT qhs  Disposition: Remain inpatient-CIR when bed available  Antimicrobial agents  See below  Anti-infectives    Start     Dose/Rate Route Frequency Ordered Stop   02/08/15 0845  ceFAZolin (ANCEF) IVPB 2 g/50 mL premix     2 g 100 mL/hr over 30 Minutes Intravenous To Radiology 02/07/15 1244 02/08/15 0913      DVT Prophylaxis: Lovenox  Code Status: Full code   Family Communication None at bedside  Procedures: None  CONSULTS:  neurology and IR  Time spent 25 minutes-Greater than 50% of this time was spent in counseling, explanation of diagnosis, planning of further management, and coordination of care.  MEDICATIONS: Scheduled Meds: . aspirin  325 mg Oral Q breakfast  . atorvastatin  80 mg Oral q1800  . carvedilol  12.5 mg Oral BID WC  . clopidogrel  75 mg Oral Q breakfast  . enoxaparin (LOVENOX) injection  40 mg Subcutaneous Q24H  . folic acid  1 mg Oral Daily  . furosemide  40 mg Oral BID  . gabapentin  100 mg Oral TID  . gatifloxacin  1 drop Left Eye QID  . mometasone-formoterol  2 puff Inhalation BID  . multivitamin with minerals  1 tablet Oral Daily  . pantoprazole  40 mg Oral Daily  . thiamine  100 mg Oral Daily  . tiotropium  18 mcg Inhalation Daily   Continuous Infusions:   PRN Meds:.acetaminophen **OR** acetaminophen, albuterol, diclofenac sodium, methocarbamol, ondansetron (ZOFRAN) IV, senna-docusate, tiZANidine, traMADol    PHYSICAL EXAM: Vital signs in last 24 hours: Filed Vitals:   02/11/15 0142 02/11/15 0556 02/11/15 0931 02/11/15 1208  BP: 137/75 130/83 112/59   Pulse: 82 82 85   Temp: 97.5 F (36.4 C) 98.4 F (36.9 C) 98.3 F (36.8 C)   TempSrc: Oral Oral Oral   Resp: '18 18 20   '$ Height:      Weight:      SpO2: 95% 93% 93% 94%    Weight change:  Filed Weights   02/07/15 0005 02/08/15 1500  Weight: 116.121 kg (256 lb) 114.4 kg (252 lb 3.3 oz)   Body mass index is 34.2 kg/(m^2).   Gen Exam: Awake and alert with clear speech.   Neck: Supple, No JVD.  Chest: B/L Clear.   CVS: S1 S2 Regular, no murmurs.  Abdomen: soft, BS +, non tender, non distended.  Extremities: no edema, lower extremities warm to touch. Neurologic: Left sided weakness-4/5   Skin: No Rash.   Wounds: N/A.    Intake/Output from previous day:  Intake/Output Summary (Last 24 hours) at 02/11/15 1407 Last data filed at 02/11/15 1015  Gross per 24 hour  Intake    720 ml  Output   2350 ml  Net   -1630 ml     LAB RESULTS: CBC  Recent Labs Lab 02/06/15 1310 02/08/15 0551 02/08/15 1515 02/09/15 0425 02/11/15 0334  WBC 5.3 5.4 5.7 5.5 6.5  HGB 15.6 14.8 13.0 12.9* 13.2  HCT 47.2 45.5 41.3 41.0 41.2  PLT 125* 114* 106* 108* 132*  MCV 93.8 94.6 94.5 94.3 93.6  MCH 31.0 30.8 29.7 29.7 30.0  MCHC 33.1 32.5 31.5 31.5 32.0  RDW 14.2 14.3 14.4 14.4 14.2  LYMPHSABS 1.5 1.4 1.1 1.2  --   MONOABS 0.7 0.7 0.6 0.7  --   EOSABS 0.2 0.3 0.1 0.2  --   BASOSABS 0.0 0.0 0.0 0.0  --     Chemistries   Recent Labs Lab 02/06/15 1310 02/08/15 0551 02/09/15 0425 02/10/15 0530  NA 145 144 147* 144  K 4.3 4.0 3.8 3.8  CL 110 109 112* 108  CO2 '25 28 28 30  '$ GLUCOSE 115* 124* 110* 116*  BUN 9 8 <5* 11  CREATININE 0.92 0.82 0.70 0.90  CALCIUM 10.0 9.6 9.2 9.7    CBG:  Recent Labs Lab 02/08/15 0820  GLUCAP 118*    GFR Estimated Creatinine Clearance: 106.8 mL/min (by C-G formula based on Cr of 0.9).  Coagulation profile  Recent Labs Lab 02/07/15 0958 02/08/15 0551  INR 1.08 1.07    Cardiac Enzymes No results for input(s): CKMB, TROPONINI, MYOGLOBIN in the last 168 hours.  Invalid input(s): CK  Invalid input(s): POCBNP No results for input(s): DDIMER in the last 72 hours. No results for input(s): HGBA1C in the last 72 hours. No results for input(s): CHOL, HDL, LDLCALC, TRIG, CHOLHDL, LDLDIRECT in the last 72 hours. No results for input(s): TSH, T4TOTAL, T3FREE, THYROIDAB in the last 72 hours.  Invalid input(s): FREET3 No results for input(s): VITAMINB12, FOLATE, FERRITIN, TIBC, IRON, RETICCTPCT in the last 72 hours. No results for input(s): LIPASE, AMYLASE in the last 72 hours.  Urine Studies No results for input(s): UHGB, CRYS in the last 72 hours.  Invalid input(s): UACOL, UAPR, USPG, UPH, UTP, UGL, UKET, UBIL, UNIT, UROB, ULEU, UEPI, UWBC, URBC, UBAC, CAST, UCOM, BILUA  MICROBIOLOGY: Recent Results (from the past 240 hour(s))  Surgical pcr screen      Status: None   Collection Time: 02/07/15  1:03 AM  Result Value Ref Range Status   MRSA, PCR NEGATIVE NEGATIVE Final   Staphylococcus aureus NEGATIVE NEGATIVE Final    Comment:        The Xpert SA Assay (FDA approved for NASAL specimens in patients over 57 years of age), is one component of a comprehensive surveillance program.  Test performance has been validated by [facility] for patients greater than or equal to 60 year old. It is not intended to diagnose infection nor to guide or monitor treatment.     RADIOLOGY STUDIES/RESULTS: Dg Chest 2 View  02/06/2015  CLINICAL DATA:  CVA. EXAM: CHEST  2 VIEW COMPARISON:  08/19/2014 chest radiograph. FINDINGS: Stable cardiomediastinal silhouette with normal heart size. No  pneumothorax. No pleural effusion. Clear lungs, with no focal lung consolidation and no pulmonary edema. IMPRESSION: No active cardiopulmonary disease. Electronically Signed   By: Ilona Sorrel M.D.   On: 02/06/2015 21:54   Ct Head Wo Contrast  02/06/2015  CLINICAL DATA:  Left-sided weakness for 2-3 days. History of multiple strokes. EXAM: CT HEAD WITHOUT CONTRAST TECHNIQUE: Contiguous axial images were obtained from the base of the skull through the vertex without intravenous contrast. COMPARISON:  08/27/2014 FINDINGS: Mild chronic small vessel disease throughout the deep white matter. No acute intracranial abnormality. Specifically, no hemorrhage, hydrocephalus, mass lesion, acute infarction, or significant intracranial injury. No acute calvarial abnormality. Opacified left maxillary sinus is stable since prior study. Postoperative changes in the left anterior maxillary wall and floor the left orbit. Mastoid air cells are clear. IMPRESSION: Chronic small vessel disease throughout the deep white matter. No acute intracranial abnormality. Electronically Signed   By: Rolm Baptise M.D.   On: 02/06/2015 12:42   Mr Brain Wo Contrast  02/06/2015  CLINICAL DATA:  65 year old male  with left side weakness beginning 2 days ago. Initial encounter. EXAM: MRI HEAD WITHOUT CONTRAST TECHNIQUE: Multiplanar, multiecho pulse sequences of the brain and surrounding structures were obtained without intravenous contrast. COMPARISON:  Head CT without contrast 1233 hours today. Brain MRI 08/27/2014. FINDINGS: Chronically abnormal appearance of the right ICA siphon, but with suggestion of reconstituted flow at the right ICA terminus. Overall Major intracranial vascular flow voids are stable since July. Patchy mostly cortically based restricted diffusion in the right superior frontal gyrus involving the MCA territory (some MCA/ACA watershed area in the pre motor region). White matter extension to the right corona radiata on series 4, image 18. No contralateral left hemisphere or posterior fossa restricted diffusion. Associated patchy T2 and FLAIR hyperintensity in the acutely affected areas. No definite associated acute hemorrhage, although there is probably vascular related T2* asymmetry along the medial right hemisphere (series 8, images 16-19) which is new since July. Chronic micro hemorrhage in the right occipital lobe is unchanged. Widespread scattered cerebral white matter T2 and FLAIR hyperintensity elsewhere in both hemispheres, greater on the right, then is stable since July. No midline shift, mass effect, evidence of mass lesion, ventriculomegaly, extra-axial collection or acute intracranial hemorrhage. Cervicomedullary junction and pituitary are within normal limits. Negative visualized cervical spine. Stable paranasal sinuses and mastoids. Interval postoperative changes to the left globe, otherwise stable orbit and scalp soft tissues. Visualized bone marrow signal is within normal limits. IMPRESSION: 1. 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 2. Chronic poor flow in the right ICA - See also 08/27/2014 Head MRA report. Electronically Signed    By: Genevie Ann M.D.   On: 02/06/2015 16:19    Oren Binet, MD  [facility] Pager:[phone]  If 7PM-7AM, please contact night-coverage [email or web address] Password [email or web address] 02/11/2015, 2:07 PM   LOS: 5 days

## 2015-02-11 NOTE — Care Management Important Message (Signed)
Important Message  Patient Details  Name: [name] MRN: [id number] Date of Birth: [birth year]   Medicare Important Message Given:  Yes    Louanne Belton 02/11/2015, 12:37 [place] Message  Patient Details  Name: [name] MRN: [id number] Date of Birth: 05-26-1950   Medicare Important Message Given:  Yes    Hieu Herms, Neal Dy 02/11/2015, 12:37 PM

## 2015-02-11 NOTE — Progress Notes (Signed)
Inpatient Rehabilitation   I met with [name] to discuss potential need for IP Rehab.  Pt. Seems to have decreased insight into his condition and believes he can DC to his apartment at [facility] and manage just fine.  Pt. Able to walk 20' with RW today during PT session and was limited by neuropathic pain in his feet.  His insurance carrier [facility] Medicare is closed today.  I will follow up with pt. Tomorrow and will initiate insurance auth process if pt. Is agreeable to CIR and pending if PMR MD thinks pt. can achieve mod I goals.  I will f/u tomorrow am.  Please call if questions.  [place] Admissions Coordinator Cell [phone] Office [phone]

## 2015-02-12 ENCOUNTER — Encounter (HOSPITAL_COMMUNITY): Payer: Self-pay | Admitting: Interventional Radiology

## 2015-02-12 ENCOUNTER — Telehealth: Payer: Self-pay | Admitting: Vascular Surgery

## 2015-02-12 LAB — URIC ACID: URIC ACID, SERUM: 8.6 mg/dL — AB (ref 4.4–7.6)

## 2015-02-12 MED ORDER — ATORVASTATIN CALCIUM 40 MG PO TABS: 80.0000 mg | ORAL_TABLET | Freq: Every day | ORAL | Status: DC

## 2015-02-12 MED ORDER — PREDNISONE 10 MG PO TABS: ORAL_TABLET | ORAL | Status: DC

## 2015-02-12 MED ORDER — GABAPENTIN 300 MG PO CAPS: 300.0000 mg | ORAL_CAPSULE | Freq: Three times a day (TID) | ORAL | Status: DC

## 2015-02-12 MED ORDER — PANTOPRAZOLE SODIUM 40 MG PO TBEC: 40.0000 mg | DELAYED_RELEASE_TABLET | Freq: Every day | ORAL | Status: DC

## 2015-02-12 MED ORDER — GABAPENTIN 300 MG PO CAPS
300.0000 mg | ORAL_CAPSULE | Freq: Three times a day (TID) | ORAL | Status: DC
  Administered 2015-02-12 – 2015-02-13 (×4): 300 mg via ORAL
  Filled 2015-02-12 (×4): qty 1

## 2015-02-12 MED ORDER — CARVEDILOL 6.25 MG PO TABS: 12.5000 mg | ORAL_TABLET | Freq: Two times a day (BID) | ORAL | Status: DC

## 2015-02-12 MED ORDER — PREDNISONE 20 MG PO TABS
60.0000 mg | ORAL_TABLET | Freq: Every day | ORAL | Status: DC
  Administered 2015-02-12 – 2015-02-13 (×2): 60 mg via ORAL
  Filled 2015-02-12 (×2): qty 3

## 2015-02-12 NOTE — Progress Notes (Signed)
Inpatient Rehabilitation  Pt. With ongoing pain in both feet and not able to participate in his PT session this am.  I have discussed case with Dr. Posey Pronto (PMR physician).  He does not believe pt. Can reach modified independence for return to his apartment at [facility] in a short rehab stay on CIR, therefore is recommending SNF.  I have discussed this recommendation with the patient , with Dr. Sloan Leiter, and with Jacqualin Combes, RNCM.  I have texted Glendon Axe, CSW to notify her for need for SNF.  I will sign off.  Please call if questions.]]  Gerlean Ren PT Inpatient Rehab Admissions Coordinator Cell [phone] Office [phone]

## 2015-02-12 NOTE — Care Management Note (Signed)
Case Management Note  Patient Details  Name: CHOUA CHALKER MRN: [id number] Date of Birth: [birth year]  Subjective/Objective:                    Action/Plan: CM met with patient and caregiver (aide through [facility], provided by [facility]).  Patient and aide discussed his current physical limitations and patient expressed interest in short-term skilled nursing rehab prior to going home.  Per caregiver, patient's Medicaid caseworker has been notified of the potential need for increased aide services once patient is discharged home.  CSW notified of patient's interest in SNF. Patient requests [facility] and has given permission for CSW to fax his information out to receive bed offers.  Expected Discharge Date:                  Expected Discharge Plan:  Skilled Nursing Facility  In-House Referral:  Clinical Social Work  Discharge planning Services  CM Consult  Post Acute Care Choice:    Choice offered to:  Patient  DME Arranged:    DME Agency:     HH Arranged:    [place] Agency:     Status of Service:  In process, will continue to follow  Medicare Important Message Given:  Yes Date Medicare IM Given:    Medicare IM give by:    Date Additional Medicare IM Given:    Additional Medicare Important Message give by:     If discussed at [place] of Stay Meetings, dates discussed:    Additional CommentsRolm Baptise, RN 02/12/2015, 3:01 PM [phone]

## 2015-02-12 NOTE — Progress Notes (Signed)
Occupational Therapy Treatment Patient Details Name: VICKI PASQUAL MRN: [id number] DOB: 10-02-1950 Today's Date: 02/12/2015    History of present illness Pt is a 65 y/o male with a PMH of CVA in the right MCA distribution with right MCA stenosis who presents with recurrent strokes in this distribution. He states that 2 nights ago he had left-sided weakness and numbness which then subsequently improved after approximately 3 hours. On 12/27, he had progressive worsening of the symptoms again which did not improve. He subsequently sought care in the ED which shows recurrent infarcts in the watershed distributions of the right MCA territory.  Pt is now s/p angiogram w/ stenting of Rt ICA.    OT comments  Pt is making good progress in OT.  He lives alone.  Continue to recommend CIR; reinforced this with him.  Follow Up Recommendations  CIR    Equipment Recommendations  3 in 1 bedside comode (eventually HHOT look at tub, possibly bench)    Recommendations for Other Services      Precautions / Restrictions Precautions Precautions: Fall Restrictions Weight Bearing Restrictions: No       Mobility Bed Mobility         Supine to sit: Min guard;HOB elevated     General bed mobility comments: increased time and use of bedrail  Transfers   Equipment used: Rolling walker (2 wheeled) Transfers: Sit to/from Stand Sit to Stand: Min assist         General transfer comment: assist to rise and steady    Balance   Sitting-balance support: Feet supported Sitting balance-Leahy Scale: Fair     Standing balance support: Bilateral upper extremity supported Standing balance-Leahy Scale: Poor                     ADL       Grooming: Set up;Sitting;Wash/dry hands;Wash/dry face   Upper Body Bathing: Sitting;Minimal assitance   Lower Body Bathing: Minimal assistance;Sit to/from stand;With adaptive equipment (simulated with long sponge:  he hs one at home)   Upper Body  Dressing : Minimal assistance;Sitting   Lower Body Dressing: Moderate assistance;Sit to/from stand;With adaptive equipment                 General ADL Comments: pt completed ADL from EOB.  Educated on use of reacher for pants; he has a Secondary school teacher at home.  Used this to doff sock with mod A.  donned socks with mod A using sock aide.  He was able to don sock on AE with extra time and using mostly R hand; he was unable to slide either leg into sock aide--may need to try wide model on next visit.  Pt dropped L string once.  He reports decreased sensation.  Educated to keep eyes on L hand for increased success.  Pt very pleasant and motivated      Tourist information centre manager   Behavior During Therapy: [facility] for tasks assessed/performed Overall Cognitive Status: Within Functional Limits for tasks assessed                       Extremity/Trunk Assessment               Exercises     Shoulder Instructions       General Comments  Pertinent Vitals/ Pain       Faces Pain Scale: Hurts little more Pain Location: bil feet when standing Pain Descriptors / Indicators: Burning Pain Intervention(s): Limited activity within patient's tolerance;Monitored during session;Repositioned  Home Living                                          Prior Functioning/Environment              Frequency Min 3X/week     Progress Toward Goals  OT Goals(current goals can now be found in the care plan section)  Progress towards OT goals: Progressing toward goals  Acute Rehab OT Goals Patient Stated Goal: Get stronger  Plan Discharge plan remains appropriate    Co-evaluation                 End of Session     Activity Tolerance Patient tolerated treatment well   Patient Left in bed;with call bell/phone within reach;with bed alarm set   Nurse Communication          Time: 2025-4270 OT Time  Calculation (min): 32 min  Charges: OT General Charges $OT Visit: 1 Procedure OT Treatments $Self Care/Home Management : 23-37 mins  Tiyah Zelenak 02/12/2015, 8:58 AM  Lesle Chris, OTR/L [phone] 02/12/2015

## 2015-02-12 NOTE — Progress Notes (Signed)
PT Cancellation Note  Patient Details Name: [name] MRN: [id number] DOB: 1950/06/19   Cancelled Treatment:    Reason Eval/Treat Not Completed: Pain limiting ability to participate. Patient reporting bil foot pain worse than yesterday. Reports he tried standing with nurse tech and could not tolerate. Dr. Sloan Leiter on unit and made aware.   Spurgeon Gancarz 02/12/2015, 9:28 AM  Pager [phone]

## 2015-02-12 NOTE — Discharge Summary (Signed)
PATIENT DETAILS Name: [name] Age: 65 y.o. Sex: male Date of Birth: [birth year] MRN: [id number]. Admitting Physician: Waldemar Dickens, MD WUJ:[id number], [name], MD  Admit Date: 02/06/2015 Discharge date: 02/13/2015  Recommendations for Outpatient Follow-up:  1. Ensure follow-up with interventional radiology, neurology and vascular surgery.  2. Please repeat CBC/BMET in 1 week   PRIMARY DISCHARGE DIAGNOSIS:  Principal Problem:   Stroke (cerebrum) (HCC) Active Problems:   COPD (chronic obstructive pulmonary disease) (HCC)   HTN (hypertension)   Peripheral vascular disease (HCC)   Tobacco abuse   GERD (gastroesophageal reflux disease)   PAF (paroxysmal atrial fibrillation) (HCC)   History of stroke   Coronary artery disease involving native coronary artery of native heart without angina pectoris   Tachypnea   Thrombocytopenia ([place])   Cerebrovascular accident (CVA) due to stenosis of right carotid artery (HCC)   Acute gouty arthritis      PAST MEDICAL HISTORY: Past Medical History  Diagnosis Date  . Depression   . Hypertension   . Hyperlipidemia   . Myocardial infarction ([place]) 2000  . CHF (congestive heart failure) ([place])   . COPD (chronic obstructive pulmonary disease) ([place])   . Peripheral vascular disease, unspecified ([place])     08/20/10 doppler: increase in right ABI post-op. Left ABI stable. S/P bi-fem bypass surgery  . GERD (gastroesophageal reflux disease)   . Asthma   . Anxiety   . History of DVT of lower extremity   . CAD (coronary artery disease) 05/27/10    Cath: severe single vessell CAD left cx midportion obtuse marginal 2 to 3.  . Shortness of breath   . Pneumonia 04/04/2012  . Arthritis   . OSA on CPAP   . Hyperlipemia   . COPD (chronic obstructive pulmonary disease) ([place])   . Alcohol abuse     H/O  . Tobacco abuse   . Orbital fracture ([place]) 12/2012  . Stroke [facility])     DISCHARGE MEDICATIONS: Current Discharge Medication List    START taking  these medications   Details  gabapentin (NEURONTIN) 300 MG capsule Take 1 capsule (300 mg total) by mouth 3 (three) times daily.    pantoprazole (PROTONIX) 40 MG tablet Take 1 tablet (40 mg total) by mouth daily.    predniSONE (DELTASONE) 10 MG tablet Take 50 mg (5 tablets) by mouth daily for 1 day, then, Take 40 mg (4 tablets) by mouth daily for 1 day, then, Take 30 mg (3 tablets) by mouth daily for 1 day, then, Take 20 mg (2 tablets) by mouth daily for 1 day, then, Take 10 mg (1 tablets) by mouth daily for 1 day, then stop      CONTINUE these medications which have CHANGED   Details  atorvastatin (LIPITOR) 40 MG tablet Take 2 tablets (80 mg total) by mouth daily at 6 PM.    carvedilol (COREG) 6.25 MG tablet Take 2 tablets (12.5 mg total) by mouth 2 (two) times daily with a meal.    traMADol (ULTRAM) 50 MG tablet Take 1 tablet (50 mg total) by mouth every 8 (eight) hours as needed. Qty: 20 tablet, Refills: 0      CONTINUE these medications which have NOT CHANGED   Details  albuterol (PROVENTIL HFA;VENTOLIN HFA) 108 (90 BASE) MCG/ACT inhaler Inhale 2 puffs into the lungs every 6 (six) hours as needed for wheezing or shortness of breath. Qty: 1 Inhaler, Refills: 3    albuterol (PROVENTIL) (2.5 MG/3ML) 0.083% nebulizer solution Take 3 mLs (2.5  mg total) by nebulization 2 (two) times daily as needed for wheezing or shortness of breath. Qty: 75 mL, Refills: 0    aspirin 325 MG EC tablet Take 1 tablet (325 mg total) by mouth daily. Qty: 30 tablet, Refills: 0    clopidogrel (PLAVIX) 75 MG tablet Take 1 tablet (75 mg total) by mouth daily. Qty: 30 tablet, Refills: 2   Associated Diagnoses: Acute CVA (cerebrovascular accident) ([place])    furosemide (LASIX) 40 MG tablet Take 1 tablet (40 mg total) by mouth 2 (two) times daily. Qty: 60 tablet, Refills: 2    mometasone-formoterol (DULERA) 200-5 MCG/ACT AERO Inhale 2 puffs into the lungs 2 (two) times daily. Qty: 13 g, Refills: 3      tiotropium (SPIRIVA) 18 MCG inhalation capsule Place 1 capsule (18 mcg total) into inhaler and inhale daily. Qty: 30 capsule, Refills: 3    tiZANidine (ZANAFLEX) 4 MG tablet Take 1 tablet (4 mg total) by mouth 3 (three) times daily. Qty: 90 tablet, Refills: 1    Besifloxacin HCl (BESIVANCE) 0.6 % SUSP Place 1 drop into the left eye 2 (two) times daily.      STOP taking these medications     omeprazole (PRILOSEC) 20 MG capsule      Difluprednate (DUREZOL) 0.05 % EMUL      metoprolol tartrate (LOPRESSOR) 25 MG tablet         ALLERGIES:   Allergies  Allergen Reactions  . Darvocet [Propoxyphene N-Acetaminophen] Hives  . Haldol [Haloperidol Decanoate] Hives  . Acetaminophen Nausea Only    Upset stomach, tolerates Hydrocodone/APAP if taken with food Upset stomach, tolerates Hydrocodone/APAP if taken with food  . Norco [Hydrocodone-Acetaminophen] Nausea And Vomiting    Tolerates if taken with food    BRIEF HPI:  See H&P, Labs, Consult and Test reports for all details in brief, patient is a 65 year old male with history of prior recurrent right MCA territory infarct was admitted with left-sided weakness.   CONSULTATIONS:   neurology and IR  PERTINENT RADIOLOGIC STUDIES: Dg Chest 2 View  02/06/2015  CLINICAL DATA:  CVA. EXAM: CHEST  2 VIEW COMPARISON:  08/19/2014 chest radiograph. FINDINGS: Stable cardiomediastinal silhouette with normal heart size. No pneumothorax. No pleural effusion. Clear lungs, with no focal lung consolidation and no pulmonary edema. IMPRESSION: No active cardiopulmonary disease. Electronically Signed   By: Ilona Sorrel M.D.   On: 02/06/2015 21:54   Ct Head Wo Contrast  02/06/2015  CLINICAL DATA:  Left-sided weakness for 2-3 days. History of multiple strokes. EXAM: CT HEAD WITHOUT CONTRAST TECHNIQUE: Contiguous axial images were obtained from the base of the skull through the vertex without intravenous contrast. COMPARISON:  08/27/2014 FINDINGS: Mild  chronic small vessel disease throughout the deep white matter. No acute intracranial abnormality. Specifically, no hemorrhage, hydrocephalus, mass lesion, acute infarction, or significant intracranial injury. No acute calvarial abnormality. Opacified left maxillary sinus is stable since prior study. Postoperative changes in the left anterior maxillary wall and floor the left orbit. Mastoid air cells are clear. IMPRESSION: Chronic small vessel disease throughout the deep white matter. No acute intracranial abnormality. Electronically Signed   By: Rolm Baptise M.D.   On: 02/06/2015 12:42   Mr Brain Wo Contrast  02/06/2015  CLINICAL DATA:  65 year old male with left side weakness beginning 2 days ago. Initial encounter. EXAM: MRI HEAD WITHOUT CONTRAST TECHNIQUE: Multiplanar, multiecho pulse sequences of the brain and surrounding structures were obtained without intravenous contrast. COMPARISON:  Head CT without contrast 1233 hours today.  Brain MRI 08/27/2014. FINDINGS: Chronically abnormal appearance of the right ICA siphon, but with suggestion of reconstituted flow at the right ICA terminus. Overall Major intracranial vascular flow voids are stable since July. Patchy mostly cortically based restricted diffusion in the right superior frontal gyrus involving the MCA territory (some MCA/ACA watershed area in the pre motor region). White matter extension to the right corona radiata on series 4, image 18. No contralateral left hemisphere or posterior fossa restricted diffusion. Associated patchy T2 and FLAIR hyperintensity in the acutely affected areas. No definite associated acute hemorrhage, although there is probably vascular related T2* asymmetry along the medial right hemisphere (series 8, images 16-19) which is new since July. Chronic micro hemorrhage in the right occipital lobe is unchanged. Widespread scattered cerebral white matter T2 and FLAIR hyperintensity elsewhere in both hemispheres, greater on the  right, then is stable since July. No midline shift, mass effect, evidence of mass lesion, ventriculomegaly, extra-axial collection or acute intracranial hemorrhage. Cervicomedullary junction and pituitary are within normal limits. Negative visualized cervical spine. Stable paranasal sinuses and mastoids. Interval postoperative changes to the left globe, otherwise stable orbit and scalp soft tissues. Visualized bone marrow signal is within normal limits. IMPRESSION: 1. 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 2. Chronic poor flow in the right ICA - See also 08/27/2014 Head MRA report. Electronically Signed   By: Genevie Ann M.D.   On: 02/06/2015 16:19     PERTINENT LAB RESULTS: CBC:  Recent Labs  02/11/15 0334  WBC 6.5  HGB 13.2  HCT 41.2  PLT 132*   CMET CMP     Component Value Date/Time   NA 144 02/10/2015 0530   K 3.8 02/10/2015 0530   CL 108 02/10/2015 0530   CO2 30 02/10/2015 0530   GLUCOSE 116* 02/10/2015 0530   BUN 11 02/10/2015 0530   CREATININE 0.90 02/10/2015 0530   CREATININE 1.11 10/24/2014 1046   CALCIUM 9.7 02/10/2015 0530   PROT 6.0* 02/08/2015 0551   ALBUMIN 3.4* 02/08/2015 0551   AST 13* 02/08/2015 0551   ALT 13* 02/08/2015 0551   ALKPHOS 63 02/08/2015 0551   BILITOT 0.7 02/08/2015 0551   GFRNONAA >60 02/10/2015 0530   GFRNONAA 83 11/21/2013 1228   GFRAA >60 02/10/2015 0530   GFRAA >89 11/21/2013 1228    GFR Estimated Creatinine Clearance: 106.8 mL/min (by C-G formula based on Cr of 0.9). No results for input(s): LIPASE, AMYLASE in the last 72 hours. No results for input(s): CKTOTAL, CKMB, CKMBINDEX, TROPONINI in the last 72 hours. Invalid input(s): POCBNP No results for input(s): DDIMER in the last 72 hours. No results for input(s): HGBA1C in the last 72 hours. No results for input(s): CHOL, HDL, LDLCALC, TRIG, CHOLHDL, LDLDIRECT in the last 72 hours. No results for input(s): TSH, T4TOTAL, T3FREE,  THYROIDAB in the last 72 hours.  Invalid input(s): FREET3 No results for input(s): VITAMINB12, FOLATE, FERRITIN, TIBC, IRON, RETICCTPCT in the last 72 hours. Coags: No results for input(s): INR in the last 72 hours.  Invalid input(s): PT Microbiology: Recent Results (from the past 240 hour(s))  Surgical pcr screen     Status: None   Collection Time: 02/07/15  1:03 AM  Result Value Ref Range Status   MRSA, PCR NEGATIVE NEGATIVE Final   Staphylococcus aureus NEGATIVE NEGATIVE Final    Comment:        The Xpert SA Assay (FDA approved for NASAL specimens in patients over 21  years of age), is one component of a comprehensive surveillance program.  Test performance has been validated by [facility] for patients greater than or equal to 4 year old. It is not intended to diagnose infection nor to guide or monitor treatment.      BRIEF HOSPITAL COURSE:  Recurrent right MCA territory infarct: Secondary to known right ICA supraclinoid near occlusion. Seen by neurology, recommendations were for cerebral angiogram and stenting.Interventional radiology was consulted, patient underwent catheter guided cerebral angiogram with stenting of RT ICA supraclinoid segment on 12/30. Recommendations are to in today with aspirin/Plavix and statin. Much more strenght in LUE/LLE-approximately 4/5. Evaluated by CIR-recommendations are for SNF. Ensure follow-up with interventional radiology-see below.  Active Problems: Hypertension: Controlled, continue Coreg. Follow BP trend and adjust  Dyslipidemia: Continue Lipitor-dose increased to 80 mg. LDL- 150 (goal<70)  COPD: Lungs clear, continue current bronchodilator regimen.  Acute gouty arthritis: Patient started complaining of bilateral ankle pain/foot pain 1-2 days back-although has a history of peripheral neuropathy and peripheral vascular disease-this was felt to be secondary to acute gouty arthritis as his uric acid levels were elevated. He was  started on prednisone with significant improvement, continue prednisone taper.  History of CAD: No chest pain or shortness of breath, continue antiplatelets, statin,Coreg. Most recent nuclear stress test in October 2016 negative for ischemia.  History of peripheral vascular disease: Followed by vascular surgery-plans are to redo right femoral to below knee popliteal bypass-tentatively scheduled for 02/19/2015. I have spoken with patient's vascular surgeon-Dr. Reatha Harps  will  Reschedule the surgery. Please note both lower extremities are warm to touch.  History of DVT: Previously on xarelto-this was discontinued because of noncompliance  GERD:PPI  Polysubstance abuse: Previous urinary drug screen is positive for cocaine/benzo/opiates. Urine drug screen pending this admission.  IEP:PIRJ qhs   TODAY-DAY OF DISCHARGE:  Subjective:   [name] today has no headache,no chest abdominal pain,no new weakness tingling or numbness, feels much better wants to go home today.   Objective:   Blood pressure 123/79, pulse 82, temperature 98 F (36.7 C), temperature source Oral, resp. rate 20, height 6' (1.829 m), weight 114.4 kg (252 lb 3.3 oz), SpO2 94 %.  Intake/Output Summary (Last 24 hours) at 02/13/15 1010 Last data filed at 02/13/15 0549  Gross per 24 hour  Intake      0 ml  Output   2650 ml  Net  -2650 ml   Filed Weights   02/07/15 0005 02/08/15 1500  Weight: 116.121 kg (256 lb) 114.4 kg (252 lb 3.3 oz)    Exam Awake Alert, Oriented *3, No new F.N deficits, Normal affect [place].AT,PERRAL Supple Neck,No JVD, No cervical lymphadenopathy appriciated.  Symmetrical Chest wall movement, Good air movement bilaterally, CTAB RRR,No Gallops,Rubs or new Murmurs, No Parasternal Heave +ve B.Sounds, Abd Soft, Non tender, No organomegaly appriciated, No rebound -guarding or rigidity. No Cyanosis, Clubbing or edema, No new Rash or bruise  DISCHARGE  CONDITION: Stable  DISPOSITION: SNF  DISCHARGE INSTRUCTIONS:    Activity:  As tolerated with Full fall precautions use walker/cane & assistance as needed  Get Medicines reviewed and adjusted: Please take all your medications with you for your next visit with your Primary MD  Please request your Primary MD to go over all hospital tests and procedure/radiological results at the follow up, please ask your Primary MD to get all Hospital records sent to his/her office.  If you experience worsening of your admission symptoms, develop shortness of breath, life threatening emergency, suicidal  or homicidal thoughts you must seek medical attention immediately by calling 911 or calling your MD immediately  if symptoms less severe.  You must read complete instructions/literature along with all the possible adverse reactions/side effects for all the Medicines you take and that have been prescribed to you. Take any new Medicines after you have completely understood and accpet all the possible adverse reactions/side effects.   Do not drive when taking Pain medications.   Do not take more than prescribed Pain, Sleep and Anxiety Medications  Special Instructions: If you have smoked or chewed Tobacco  in the last 2 yrs please stop smoking, stop any regular Alcohol  and or any Recreational drug use.  Wear Seat belts while driving.  Please note  You were cared for by a hospitalist during your hospital stay. Once you are discharged, your primary care physician will handle any further medical issues. Please note that NO REFILLS for any discharge medications will be authorized once you are discharged, as it is imperative that you return to your primary care physician (or establish a relationship with a primary care physician if you do not have one) for your aftercare needs so that they can reassess your need for medications and monitor your lab values.   Diet recommendation: Heart Healthy diet  Discharge  Instructions    Call MD for:  extreme fatigue    Complete by:  As directed      Call MD for:  persistant dizziness or light-headedness    Complete by:  As directed      Call MD for:  severe uncontrolled pain    Complete by:  As directed      Diet - low sodium heart healthy    Complete by:  As directed      Diet - low sodium heart healthy    Complete by:  As directed      Increase activity slowly    Complete by:  As directed      Increase activity slowly    Complete by:  As directed            Follow-up Information    Follow up with DEVESHWAR, Fritz Pickerel, MD In 2 weeks.   Specialty:  Interventional Radiology   Why:  pt will hear from scheduler with time and date of 2 week follow up appt   Contact information:   [address] [place] [address] [phone]       Follow up with Xu,Jindong, MD. Schedule an appointment as soon as possible for a visit in 2 months.   Specialty:  Neurology   Why:  stroke clinic   Contact information:   [address] [place] [address] [phone]       Follow up with Arnoldo Morale, MD. Go in 1 week.   Specialty:  Family Medicine   Why:  Hospital follow up   Contact information:   [place] [address] [phone]       Follow up with Deitra Mayo, MD.   Specialties:  Vascular Surgery, Cardiology   Why:  office will call to reschedule surgery   Contact information:   [place]  [address] [phone]      Total Time spent on discharge equals  45 minutes.  SignedOren Binet 02/13/2015 10:10 AM

## 2015-02-12 NOTE — Clinical Social Work Note (Signed)
Clinical Social Work Assessment  Patient Details  Name: [name] MRN: [id number] Date of Birth: 11-Feb-1950  Date of referral:  02/12/15               Reason for consult:  Facility Placement, Discharge Planning                Permission sought to share information with:  Family Supports, Customer service manager, Case Manager Permission granted to share information::  Yes, Verbal Permission Granted  Name::      Duncan Dull)  Agency::   (SNF)  Relationship::   (Care Worker/Giver)  Contact Information:   [phone])  Housing/Transportation Living arrangements for the past 2 months:  [place] of Information:  Patient Patient Interpreter Needed:  None Criminal Activity/Legal Involvement Pertinent to Current Situation/Hospitalization:  No - Comment as needed Significant Relationships:  Friend Lives with:  Self Do you feel safe going back to the place where you live?  No Need for family participation in patient care:  No (Coment)  Care giving concerns: Lives alone at home.    Social Worker assessment / plan:  Patient agreeable to SNF placement in [place]. Patient lives alone at home however has assistance 2 hours per day through home health aide. CNA hours can be increased if needed. Pt was being considered for CIR, insurance denied. CSW remains available as needed.   Employment status:  Retired Nurse, adult PT Recommendations:  Inpatient [place] / Referral to community resources:  [place]  Patient/Family's Response to care:  Pt a/o x 4. Pt agreeable to SNF placement.  Patient/Family's Understanding of and Emotional Response to Diagnosis, Current Treatment, and Prognosis: Pt knowledgeable of medical intervention and agreeable to SNF placement.   Emotional Assessment Appearance:  Developmentally appropriate Attitude/Demeanor/Rapport:   (Pleasant ) Affect (typically observed):   Accepting, Appropriate, Pleasant Orientation:  Oriented to Situation, Oriented to  Time, Oriented to Place, Oriented to Self Alcohol / Substance use:  Not Applicable Psych involvement (Current and /or in the community):  No (Comment)  Discharge Needs  Concerns to be addressed:  Care Coordination Readmission within the last 30 days:  No Current discharge risk:  Dependent with Mobility Barriers to Discharge:  Barriers Resolved   Rozell Searing, LCSW 02/12/2015, 8:25 PM

## 2015-02-12 NOTE — Progress Notes (Signed)
   02/12/15 1423  Clinical Encounter Type  Visited With Patient;Other (Comment) (Caretaker)  Visit Type Initial   Chaplain met patient's caretaker in the hallway, and helped her find the patient's room. Chaplain offered support, introduced spiritual care services, and our services are available as needed/desired.  Jeri Lager, Chaplain 02/12/2015 2:24 PM

## 2015-02-12 NOTE — NC FL2 (Signed)
[place] LEVEL OF CARE SCREENING TOOL     IDENTIFICATION  Patient Name: [name] Birthdate: May 11, 1950 Sex: male Admission Date (Current Location): 02/06/2015  [facility] and [place] Number:  Herbalist and Address:  [facility] [place], [place] [address], [place], [place] [address]      Provider Number: [id number]  Attending Physician Name and Address:  Jonetta Osgood, MD  Relative Name and Phone Number:       Current Level of Care: Hospital Recommended Level of Care: [place] Prior Approval Number:    Date Approved/Denied:   PASRR Number:   [id number]  Discharge Plan: SNF    Current Diagnoses: Patient Active Problem List   Diagnosis Date Noted  . Cerebrovascular accident (CVA) due to stenosis of right carotid artery ([place])   . Coronary artery disease involving native coronary artery of native heart without angina pectoris   . Tachypnea   . Thrombocytopenia ([place])   . History of stroke   . Stroke (cerebrum) ([place]) 02/06/2015  . Arterial ischemic stroke, MCA (middle cerebral artery), right, acute ([place])   . DOE (dyspnea on exertion) 11/23/2014  . PAF (paroxysmal atrial fibrillation) ([place]) 11/23/2014  . HLD (hyperlipidemia) 11/01/2014  . Tobacco use disorder 11/01/2014  . Diastolic CHF, acute on chronic (HCC) 11/01/2014  . Cerebral infarction due to stenosis of right carotid artery ([place]) 11/01/2014  . Carotid stenosis 11/01/2014  . Hyperkalemia 10/24/2014  . H/O total knee replacement 10/24/2014  . Essential hypertension   . Polysubstance abuse 08/28/2014  . Cocaine abuse   . Intracranial carotid stenosis   . Headache around the eyes 08/27/2014  . Acute CVA (cerebrovascular accident) ([place]) 08/27/2014  . CVA (cerebral vascular accident) ([place]) 08/26/2014  . History of DVT (deep vein thrombosis)   . Alcoholism ([place])   . Embolic stroke ([place])   . Cerebral infarction due to unspecified mechanism   . Other  specified hypotension   . AKI (acute kidney injury) ([place])   . Stroke ([place])   . CVA (cerebral infarction) 08/01/2014  . Alcohol dependence with withdrawal with complication ([place]) 95/10/3265  . DVT (deep venous thrombosis) ([place]) 02/22/2014  . Lower extremity edema 02/21/2014  . Diastolic CHF ([place]) 12/45/8099  . CAD (coronary artery disease) 11/21/2013  . Alcohol abuse 11/21/2013  . GERD (gastroesophageal reflux disease) 08/10/2013  . Gout 08/10/2013  . Tobacco abuse 07/26/2013  . Dizziness 04/10/2013  . Binocular vision disorder with diplopia 01/03/2013  . Fracture of inferior orbital wall ([place]) 01/03/2013  . Peripheral vascular disease ([place]) 07/27/2012  . COPD (chronic obstructive pulmonary disease) ([place]) 04/04/2012  . HTN (hypertension) 04/04/2012    Orientation RESPIRATION BLADDER Height & Weight    Self, Time, Situation, Place  Normal External catheter, Incontinent 6' (182.9 cm) 252 lbs.  BEHAVIORAL SYMPTOMS/MOOD NEUROLOGICAL BOWEL NUTRITION STATUS   (NONE)  (NONE) Continent Diet (SOFT DIET )  AMBULATORY STATUS COMMUNICATION OF NEEDS Skin   Limited Assist Verbally Surgical wounds                       Personal Care Assistance Level of Assistance  Bathing, Dressing Bathing Assistance: Limited assistance   Dressing Assistance: Limited assistance     Functional Limitations Info   (NONE)          SPECIAL CARE FACTORS FREQUENCY  PT (By licensed PT), OT (By licensed OT)     PT Frequency: 4 OT Frequency: 3  Contractures      Additional Factors Info  Code Status, Allergies Code Status Info: FULL CODE  Allergies Info: Darvocet, Haldol, Acetaminophen, Norco           Current Medications (02/12/2015):  This is the current hospital active medication list Current Facility-Administered Medications  Medication Dose Route Frequency Provider Last Rate Last Dose  . acetaminophen (TYLENOL) tablet 1,000 mg  1,000 mg Oral Q6H PRN Luanne Bras, MD        Or  . acetaminophen (TYLENOL) suppository 650 mg  650 mg Rectal Q6H PRN Luanne Bras, MD      . albuterol (PROVENTIL) (2.5 MG/3ML) 0.083% nebulizer solution 2.5 mg  2.5 mg Inhalation Q6H PRN Radene Gunning, NP      . aspirin tablet 325 mg  325 mg Oral Q breakfast Luanne Bras, MD   325 mg at 02/12/15 0859  . atorvastatin (LIPITOR) tablet 80 mg  80 mg Oral q1800 Rosalin Hawking, MD   80 mg at 02/12/15 1747  . carvedilol (COREG) tablet 12.5 mg  12.5 mg Oral BID WC Jonetta Osgood, MD   12.5 mg at 02/12/15 1622  . clopidogrel (PLAVIX) tablet 75 mg  75 mg Oral Q breakfast Luanne Bras, MD   75 mg at 02/12/15 0859  . diclofenac sodium (VOLTAREN) 1 % transdermal gel 4 g  4 g Topical QID PRN Waldemar Dickens, MD      . enoxaparin (LOVENOX) injection 40 mg  40 mg Subcutaneous Q24H Jonetta Osgood, MD   40 mg at 02/12/15 1152  . folic acid (FOLVITE) tablet 1 mg  1 mg Oral Daily Waldemar Dickens, MD   1 mg at 02/12/15 1019  . furosemide (LASIX) tablet 40 mg  40 mg Oral BID Rosalin Hawking, MD   40 mg at 02/12/15 1747  . gabapentin (NEURONTIN) capsule 300 mg  300 mg Oral TID Jonetta Osgood, MD   300 mg at 02/12/15 1623  . gatifloxacin (ZYMAXID) 0.5 % ophthalmic drops 1 drop  1 drop Left Eye QID Radene Gunning, NP   1 drop at 02/12/15 1747  . methocarbamol (ROBAXIN) tablet 500 mg  500 mg Oral BID PRN Waldemar Dickens, MD   500 mg at 02/11/15 2149  . mometasone-formoterol (DULERA) 200-5 MCG/ACT inhaler 2 puff  2 puff Inhalation BID Radene Gunning, NP   2 puff at 02/12/15 1058  . multivitamin with minerals tablet 1 tablet  1 tablet Oral Daily Waldemar Dickens, MD   1 tablet at 02/12/15 1018  . ondansetron (ZOFRAN) injection 4 mg  4 mg Intravenous Q6H PRN Luanne Bras, MD   4 mg at 02/11/15 1423  . pantoprazole (PROTONIX) EC tablet 40 mg  40 mg Oral Daily Lezlie Octave Black, NP   40 mg at 02/12/15 1020  . predniSONE (DELTASONE) tablet 60 mg  60 mg Oral Q breakfast Jonetta Osgood, MD   60 mg at  02/12/15 0944  . senna-docusate (Senokot-S) tablet 1 tablet  1 tablet Oral QHS PRN Radene Gunning, NP   1 tablet at 02/10/15 2134  . thiamine (VITAMIN B-1) tablet 100 mg  100 mg Oral Daily Waldemar Dickens, MD   100 mg at 02/12/15 1019  . tiotropium (SPIRIVA) inhalation capsule 18 mcg  18 mcg Inhalation Daily Radene Gunning, NP   18 mcg at 02/12/15 1057  . tiZANidine (ZANAFLEX) tablet 4 mg  4 mg Oral Q8H PRN Radene Gunning, NP      .  traMADol (ULTRAM) tablet 100 mg  100 mg Oral TID PRN Waldemar Dickens, MD   100 mg at 02/11/15 2148     Discharge Medications: Please see discharge summary for a list of discharge medications.  Relevant Imaging Results:  Relevant Lab Results:   Additional Information SSN [id number]   Glendon Axe, MSW, LCSWA [phone] 02/12/2015 8:37 PM

## 2015-02-12 NOTE — Clinical Social Work Placement (Addendum)
   CLINICAL SOCIAL WORK PLACEMENT  NOTE  Date:  02/12/2015  Patient Details  Name: [name] MRN: [id number] Date of Birth: January 21, 1951  Clinical Social Work is seeking post-discharge placement for this patient at the [place] level of care (*CSW will initial, date and re-position this form in  chart as items are completed):  Yes   Patient/family provided with [place] Work Department's list of facilities offering this level of care within the geographic area requested by the patient (or if unable, by the patient's family).  Yes   Patient/family informed of their freedom to choose among providers that offer the needed level of care, that participate in Medicare, Medicaid or managed care program needed by the patient, have an available bed and are willing to accept the patient.  Yes   Patient/family informed of [facility]'s ownership interest in [facility] and [facility], as well as of the fact that they are under no obligation to receive care at these facilities.  PASRR submitted to EDS on 02/12/15     PASRR number received on 02/12/15     Existing PASRR number confirmed on       FL2 transmitted to all facilities in geographic area requested by pt/family on 02/12/15     FL2 transmitted to all facilities within larger geographic area on       Patient informed that his/her managed care company has contracts with or will negotiate with certain facilities, including the following:         02/13/15   Patient/family informed of bed offers received.  (updated Evette Cristal, MSW, [place], 02/13/15)  Patient chooses bed at  [facility] (updated Evette Cristal, MSW, LCSWA, 02/13/15)  Physician recommends and patient chooses bed at      Patient to be transferred to  [facility] on  02/13/15 (updated Evette Cristal, MSW, LCSWA, 02/13/15).  Patient to be transferred to facility by  [facility] (updated Evette Cristal, MSW, LCSWA, 02/13/15)     Patient family  notified on  02/13/15 of transfer.  Name of family member notified:   Patient does not have any family (updated Evette Cristal, MSW, LCSWA, 02/13/15)     PHYSICIAN Please sign FL2     Additional Comment:    _______________________________________________ Rozell Searing, LCSW 02/12/2015, 8:29 PM

## 2015-02-12 NOTE — Progress Notes (Signed)
PATIENT DETAILS Name: [name] Age: 65 y.o. Sex: male Date of Birth: Jul 17, 1950 Admit Date: 02/06/2015 Admitting Physician Waldemar Dickens, MD [id number], [name], MD  Subjective: Still complaining of pain in bilateral foot  Assessment/Plan: Principal Problem: Recurrent right MCA territory infarct: Secondary to known right ICA supraclinoid near occlusion. Seen by neurology, recommendations were for cerebral angiogram and stenting.Interventional radiology was consulted, patient underwent catheter guided cerebral angiogram with stenting of RT ICA supraclinoid segment on 12/30. Continue aspirin/Plavix and statin. Much more strenght in LUE/LLE-approximately 4/5. Seen by CIR-now recommendations are for SNF.  Active Problems: Hypertension: Controlled, continue Coreg. Follow   Dyslipidemia: Continue Lipitor, LDL- 150 (goal<70)  COPD: Lungs clear, continue current bronchodilator regimen.  Bilateral foot pain: Uric acid elevated-suspect acute gouty arthritis-start prednisone taper. Both lower extremities are warm to touch, apparently has a history of neuropathy as well as-already on Neurontin. Follow clinical course  History of CAD: No chest pain or shortness of breath, continue antiplatelets, statin,Coreg. Most recent nuclear stress test in October 2016 negative for ischemia.  History of peripheral vascular disease: Followed by vascular surgery-plans are to redo right femoral to below knee popliteal bypass-tentatively scheduled for 02/19/2015.  History of DVT: Previously on xarelto-this was discontinued because of noncompliance  GERD:PPI  Polysubstance abuse: Previous urinary drug screen is positive for cocaine/benzo/opiates. Urine drug screen pending this admission.  JOI:TGPQ qhs  Disposition: Remain inpatient-SNF on discharge  Antimicrobial agents  See below  Anti-infectives    Start     Dose/Rate Route Frequency Ordered Stop   02/08/15 0845   ceFAZolin (ANCEF) IVPB 2 g/50 mL premix     2 g 100 mL/hr over 30 Minutes Intravenous To Radiology 02/07/15 1244 02/08/15 0913      DVT Prophylaxis: Lovenox  Code Status: Full code   Family Communication None at bedside  Procedures: None  CONSULTS:  neurology and IR  Time spent 25 minutes-Greater than 50% of this time was spent in counseling, explanation of diagnosis, planning of further management, and coordination of care.  MEDICATIONS: Scheduled Meds: . aspirin  325 mg Oral Q breakfast  . atorvastatin  80 mg Oral q1800  . carvedilol  12.5 mg Oral BID WC  . clopidogrel  75 mg Oral Q breakfast  . enoxaparin (LOVENOX) injection  40 mg Subcutaneous Q24H  . folic acid  1 mg Oral Daily  . furosemide  40 mg Oral BID  . gabapentin  300 mg Oral TID  . gatifloxacin  1 drop Left Eye QID  . mometasone-formoterol  2 puff Inhalation BID  . multivitamin with minerals  1 tablet Oral Daily  . pantoprazole  40 mg Oral Daily  . predniSONE  60 mg Oral Q breakfast  . thiamine  100 mg Oral Daily  . tiotropium  18 mcg Inhalation Daily   Continuous Infusions:   PRN Meds:.acetaminophen **OR** acetaminophen, albuterol, diclofenac sodium, methocarbamol, ondansetron (ZOFRAN) IV, senna-docusate, tiZANidine, traMADol    PHYSICAL EXAM: Vital signs in last 24 hours: Filed Vitals:   02/12/15 0119 02/12/15 0550 02/12/15 0902 02/12/15 1059  BP: 110/60 138/76 157/62   Pulse: 80 85 87   Temp: 99.6 F (37.6 C) 98.5 F (36.9 C) 98.5 F (36.9 C)   TempSrc: Oral Oral Oral   Resp: '20 20 20   '$ Height:      Weight:      SpO2: 92% 93% 93% 92%    Weight  change:  Filed Weights   02/07/15 0005 02/08/15 1500  Weight: 116.121 kg (256 lb) 114.4 kg (252 lb 3.3 oz)   Body mass index is 34.2 kg/(m^2).   Gen Exam: Awake and alert with clear speech.   Neck: Supple, No JVD.   Chest: B/L Clear.   CVS: S1 S2 Regular, no murmurs.  Abdomen: soft, BS +, non tender, non distended.  Extremities: no  edema, lower extremities warm to touch. Neurologic: Very minimal left sided weakness-4/5   Skin: No Rash.   Wounds: N/A.    Intake/Output from previous day:  Intake/Output Summary (Last 24 hours) at 02/12/15 1306 Last data filed at 02/12/15 0551  Gross per 24 hour  Intake      0 ml  Output    900 ml  Net   -900 ml     LAB RESULTS: CBC  Recent Labs Lab 02/06/15 1310 02/08/15 0551 02/08/15 1515 02/09/15 0425 02/11/15 0334  WBC 5.3 5.4 5.7 5.5 6.5  HGB 15.6 14.8 13.0 12.9* 13.2  HCT 47.2 45.5 41.3 41.0 41.2  PLT 125* 114* 106* 108* 132*  MCV 93.8 94.6 94.5 94.3 93.6  MCH 31.0 30.8 29.7 29.7 30.0  MCHC 33.1 32.5 31.5 31.5 32.0  RDW 14.2 14.3 14.4 14.4 14.2  LYMPHSABS 1.5 1.4 1.1 1.2  --   MONOABS 0.7 0.7 0.6 0.7  --   EOSABS 0.2 0.3 0.1 0.2  --   BASOSABS 0.0 0.0 0.0 0.0  --     Chemistries   Recent Labs Lab 02/06/15 1310 02/08/15 0551 02/09/15 0425 02/10/15 0530  NA 145 144 147* 144  K 4.3 4.0 3.8 3.8  CL 110 109 112* 108  CO2 '25 28 28 30  '$ GLUCOSE 115* 124* 110* 116*  BUN 9 8 <5* 11  CREATININE 0.92 0.82 0.70 0.90  CALCIUM 10.0 9.6 9.2 9.7    CBG:  Recent Labs Lab 02/08/15 0820  GLUCAP 118*    GFR Estimated Creatinine Clearance: 106.8 mL/min (by C-G formula based on Cr of 0.9).  Coagulation profile  Recent Labs Lab 02/07/15 0958 02/08/15 0551  INR 1.08 1.07    Cardiac Enzymes No results for input(s): CKMB, TROPONINI, MYOGLOBIN in the last 168 hours.  Invalid input(s): CK  Invalid input(s): POCBNP No results for input(s): DDIMER in the last 72 hours. No results for input(s): HGBA1C in the last 72 hours. No results for input(s): CHOL, HDL, LDLCALC, TRIG, CHOLHDL, LDLDIRECT in the last 72 hours. No results for input(s): TSH, T4TOTAL, T3FREE, THYROIDAB in the last 72 hours.  Invalid input(s): FREET3 No results for input(s): VITAMINB12, FOLATE, FERRITIN, TIBC, IRON, RETICCTPCT in the last 72 hours. No results for input(s): LIPASE,  AMYLASE in the last 72 hours.  Urine Studies No results for input(s): UHGB, CRYS in the last 72 hours.  Invalid input(s): UACOL, UAPR, USPG, UPH, UTP, UGL, UKET, UBIL, UNIT, UROB, ULEU, UEPI, UWBC, URBC, UBAC, CAST, UCOM, BILUA  MICROBIOLOGY: Recent Results (from the past 240 hour(s))  Surgical pcr screen     Status: None   Collection Time: 02/07/15  1:03 AM  Result Value Ref Range Status   MRSA, PCR NEGATIVE NEGATIVE Final   Staphylococcus aureus NEGATIVE NEGATIVE Final    Comment:        The Xpert SA Assay (FDA approved for NASAL specimens in patients over 20 years of age), is one component of a comprehensive surveillance program.  Test performance has been validated by [facility] for patients greater than or  equal to 108 year old. It is not intended to diagnose infection nor to guide or monitor treatment.     RADIOLOGY STUDIES/RESULTS: Dg Chest 2 View  02/06/2015  CLINICAL DATA:  CVA. EXAM: CHEST  2 VIEW COMPARISON:  08/19/2014 chest radiograph. FINDINGS: Stable cardiomediastinal silhouette with normal heart size. No pneumothorax. No pleural effusion. Clear lungs, with no focal lung consolidation and no pulmonary edema. IMPRESSION: No active cardiopulmonary disease. Electronically Signed   By: Ilona Sorrel M.D.   On: 02/06/2015 21:54   Ct Head Wo Contrast  02/06/2015  CLINICAL DATA:  Left-sided weakness for 2-3 days. History of multiple strokes. EXAM: CT HEAD WITHOUT CONTRAST TECHNIQUE: Contiguous axial images were obtained from the base of the skull through the vertex without intravenous contrast. COMPARISON:  08/27/2014 FINDINGS: Mild chronic small vessel disease throughout the deep white matter. No acute intracranial abnormality. Specifically, no hemorrhage, hydrocephalus, mass lesion, acute infarction, or significant intracranial injury. No acute calvarial abnormality. Opacified left maxillary sinus is stable since prior study. Postoperative changes in the left anterior  maxillary wall and floor the left orbit. Mastoid air cells are clear. IMPRESSION: Chronic small vessel disease throughout the deep white matter. No acute intracranial abnormality. Electronically Signed   By: Rolm Baptise M.D.   On: 02/06/2015 12:42   Mr Brain Wo Contrast  02/06/2015  CLINICAL DATA:  65 year old male with left side weakness beginning 2 days ago. Initial encounter. EXAM: MRI HEAD WITHOUT CONTRAST TECHNIQUE: Multiplanar, multiecho pulse sequences of the brain and surrounding structures were obtained without intravenous contrast. COMPARISON:  Head CT without contrast 1233 hours today. Brain MRI 08/27/2014. FINDINGS: Chronically abnormal appearance of the right ICA siphon, but with suggestion of reconstituted flow at the right ICA terminus. Overall Major intracranial vascular flow voids are stable since July. Patchy mostly cortically based restricted diffusion in the right superior frontal gyrus involving the MCA territory (some MCA/ACA watershed area in the pre motor region). White matter extension to the right corona radiata on series 4, image 18. No contralateral left hemisphere or posterior fossa restricted diffusion. Associated patchy T2 and FLAIR hyperintensity in the acutely affected areas. No definite associated acute hemorrhage, although there is probably vascular related T2* asymmetry along the medial right hemisphere (series 8, images 16-19) which is new since July. Chronic micro hemorrhage in the right occipital lobe is unchanged. Widespread scattered cerebral white matter T2 and FLAIR hyperintensity elsewhere in both hemispheres, greater on the right, then is stable since July. No midline shift, mass effect, evidence of mass lesion, ventriculomegaly, extra-axial collection or acute intracranial hemorrhage. Cervicomedullary junction and pituitary are within normal limits. Negative visualized cervical spine. Stable paranasal sinuses and mastoids. Interval postoperative changes to the left  globe, otherwise stable orbit and scalp soft tissues. Visualized bone marrow signal is within normal limits. IMPRESSION: 1. 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 2. Chronic poor flow in the right ICA - See also 08/27/2014 Head MRA report. Electronically Signed   By: Genevie Ann M.D.   On: 02/06/2015 16:19    Oren Binet, MD  [facility] Pager:[phone]  If 7PM-7AM, please contact night-coverage [email or web address] Password [email or web address] 02/12/2015, 1:06 PM   LOS: 6 days

## 2015-02-12 NOTE — Progress Notes (Signed)
Pt refuses to wear CPAP.  °

## 2015-02-12 NOTE — Telephone Encounter (Signed)
-----   Message from Angelia Mould, MD sent at 02/12/2015 11:52 AM EST ----- Regarding: cancellation This patient was admitted with a stroke and is going to rehab today. He will not be ready for surgery on 02/19/15. I will need to see him in the office in around 4-6 weeks to regroup with him Thanks Gerald Stabs

## 2015-02-13 ENCOUNTER — Inpatient Hospital Stay (HOSPITAL_COMMUNITY): Admission: RE | Admit: 2015-02-13 | Payer: Self-pay | Source: Ambulatory Visit

## 2015-02-13 MED ORDER — TRAMADOL HCL 50 MG PO TABS: 50.0000 mg | ORAL_TABLET | Freq: Three times a day (TID) | ORAL | Status: DC | PRN

## 2015-02-13 MED ORDER — MAGNESIUM CITRATE PO SOLN
1.0000 | Freq: Once | ORAL | Status: AC
  Administered 2015-02-13: 1 via ORAL
  Filled 2015-02-13: qty 296

## 2015-02-13 MED ORDER — PREDNISONE 10 MG PO TABS: ORAL_TABLET | ORAL | Status: DC

## 2015-02-13 NOTE — Progress Notes (Signed)
Patient being transferred to [facility] report was called IV removed and condom catheter as well, belongings collected for transport. Telemetry removed, [facility] will transport.

## 2015-02-13 NOTE — Progress Notes (Signed)
Physical Therapy Treatment Patient Details Name: [name] MRN: [id number] DOB: 1951-01-28 Today's Date: 02/13/2015    History of Present Illness Pt is a 65 y/o male with a PMH of CVA in the right MCA distribution with right MCA stenosis who presents with recurrent strokes in this distribution. He states that 2 nights ago he had left-sided weakness and numbness which then subsequently improved after approximately 3 hours. On 12/27, he had progressive worsening of the symptoms again which did not improve. He subsequently sought care in the ED which shows recurrent infarcts in the watershed distributions of the right MCA territory.  Pt is now s/p angiogram w/ stenting of Rt ICA.     PT Comments    Patient with less bil foot pain initially, however continued to incr the farther he walked. Continues with difficulty gripping Lt hand on RW. Patient continues to require education regarding safe and proper use of DME.    Follow Up Recommendations  SNF (CIR denied)     Equipment Recommendations  Other (comment) (TBD at next venue of care)    Recommendations for Other Services       Precautions / Restrictions Precautions Precautions: Fall Restrictions Weight Bearing Restrictions: No    Mobility  Bed Mobility Overal bed mobility: Modified Independent Bed Mobility: Supine to Sit;Sit to Supine           General bed mobility comments: incr time and effort  Transfers Overall transfer level: Needs assistance Equipment used: Rolling walker (2 wheeled) Transfers: Sit to/from Stand Sit to Stand: Min assist         General transfer comment: Assist to boost up to standing, cues for hand placement using RW, and steady assist while transitioning hands to RW  Ambulation/Gait Ambulation/Gait assistance: Min guard Ambulation Distance (Feet): 60 Feet Assistive device: Rolling walker (2 wheeled) Gait Pattern/deviations: Step-to pattern;Decreased stride length;Shuffle Gait velocity:  very slow Gait velocity interpretation: Below normal speed for age/gender General Gait Details: vc for proper use of RW (especially with turning);    Stairs            Wheelchair Mobility    Modified Rankin (Stroke Patients Only) Modified Rankin (Stroke Patients Only) Pre-Morbid Rankin Score: No symptoms Modified Rankin: Moderately severe disability     Balance     Sitting balance-Leahy Scale: Fair       Standing balance-Leahy Scale: Poor                      Cognition Arousal/Alertness: Awake/alert Behavior During Therapy: WFL for tasks assessed/performed Overall Cognitive Status: Within Functional Limits for tasks assessed                      Exercises General Exercises - Lower Extremity Ankle Circles/Pumps: AROM;Both;10 reps    General Comments        Pertinent Vitals/Pain Pain Assessment: 0-10 Pain Score: 9  Pain Location: tops of feet  Pain Descriptors / Indicators: Grimacing Pain Intervention(s): Limited activity within patient's tolerance;Monitored during session;Premedicated before session;Repositioned    Home Living                      Prior Function            PT Goals (current goals can now be found in the care plan section) Acute Rehab PT Goals Patient Stated Goal: Get stronger Time For Goal Achievement: 02/21/15 Progress towards PT goals: Progressing toward goals  Frequency  Min 3X/week    PT Plan Discharge plan needs to be updated;Frequency needs to be updated    Co-evaluation             End of Session Equipment Utilized During Treatment: Gait belt Activity Tolerance: Patient limited by pain;Patient limited by fatigue Patient left: with call bell/phone within reach;in bed;with bed alarm set (pt refused to sit in recliner due to discomfort)     Time: 7782-4235 PT Time Calculation (min) (ACUTE ONLY): 22 min  Charges:  $Gait Training: 8-22 mins                    G Codes:       [name] 2015-03-07, 11:37 AM Pager [phone]

## 2015-02-13 NOTE — Clinical Social Work Note (Signed)
CSW spoke with patient to present bed offers, patient chose [facility].  CSW contacted [facility] who said they can take patient today.  Patient to be d/c'ed today to [facility].  Patient agreeable to plans will transport via ems RN to call report to [phone].   [name], MSW, [place]

## 2015-02-13 NOTE — Progress Notes (Signed)
Speech Language Pathology Treatment: Cognitive-Linquistic  Patient Details Name: [name] MRN: [id number] DOB: 04/02/1950 Today's Date: 02/13/2015 Time: 1210-1218 SLP Time Calculation (min) (ACUTE ONLY): 8 min  Assessment / Plan / Recommendation Clinical Impression  F/u after initial speech evaluation last week. Pt's vocal quality has improved to baseline, with full intelligibility of speech and resolved communication deficits per reassessment.  Pt for D/C to SNF today -no SLP f/u will be warranted.  Will sign off.    HPI HPI: [name] is a 65 y.o. male with a Past Medical History of HTN, HLD, MI, CHF, COPD, GERD, CVA, CAD, DVT, OSA on CPAP, EtOH abuse, tobacco use who presents with another stroke while on aspirin and Plavix and statin. Patient had right ICA stent placed today (12/30).       SLP Plan  Discharge SLP treatment due to (comment)     Recommendations   no slp f/u              Follow up Recommendations: None Plan: Discharge SLP treatment due to (comment)   [name] 02/13/2015, 12:23 PM

## 2015-02-14 ENCOUNTER — Encounter: Payer: Self-pay | Admitting: Internal Medicine

## 2015-02-14 ENCOUNTER — Non-Acute Institutional Stay (SKILLED_NURSING_FACILITY): Payer: Medicare Other | Admitting: Internal Medicine

## 2015-02-14 DIAGNOSIS — I480 Paroxysmal atrial fibrillation: Secondary | ICD-10-CM | POA: Diagnosis not present

## 2015-02-14 DIAGNOSIS — M1000 Idiopathic gout, unspecified site: Secondary | ICD-10-CM

## 2015-02-14 DIAGNOSIS — I69354 Hemiplegia and hemiparesis following cerebral infarction affecting left non-dominant side: Secondary | ICD-10-CM | POA: Diagnosis not present

## 2015-02-14 DIAGNOSIS — I63231 Cerebral infarction due to unspecified occlusion or stenosis of right carotid arteries: Secondary | ICD-10-CM | POA: Diagnosis not present

## 2015-02-14 DIAGNOSIS — J449 Chronic obstructive pulmonary disease, unspecified: Secondary | ICD-10-CM | POA: Diagnosis not present

## 2015-02-14 DIAGNOSIS — I5032 Chronic diastolic (congestive) heart failure: Secondary | ICD-10-CM | POA: Diagnosis not present

## 2015-02-14 DIAGNOSIS — Z720 Tobacco use: Secondary | ICD-10-CM | POA: Diagnosis not present

## 2015-02-14 DIAGNOSIS — I10 Essential (primary) hypertension: Secondary | ICD-10-CM | POA: Diagnosis not present

## 2015-02-14 DIAGNOSIS — I739 Peripheral vascular disease, unspecified: Secondary | ICD-10-CM | POA: Diagnosis not present

## 2015-02-14 NOTE — Progress Notes (Signed)
Patient ID: [name], male   DOB: 1950/11/07, 65 y.o.   MRN: [id number]    HISTORY AND PHYSICAL   DATE: 02/14/15  Location:  [facility]    Place of Service: SNF (31)   Extended Emergency Contact Information Primary Emergency Contact: Pendleton,Angela Address: [address]          [place], [place] [address] [place] Phone: [phone] Relation: Other  Advanced Directive information  DNR  Chief Complaint  Patient presents with  . New Admit To SNF    HPI:  65 yo male seen today as new admission into SNF following hospital stay for acute CVA with left hemiparesis, acute gouty arthrtits, COPD, PAD, PAF, OSA.  He had a cath guided cerebral A gram on 12/30th by IR and is s/p stent to right ICA supraclinoid segment. He was started on ASA/plavix. hehad an aute gout attack and Rx prednisone taper. He was scheduled for right fem-below the knee popliteal bypass on 1/10th but vascular sx rescheduled procedure due to acute CVA.  Sent to SNF for short term rehab  He c/o b/l foot pain and he is not sure that he is receiving prednisone for gout and his gabapentin. No nursing issues. No falls. Tolerating PT/OT  COPD - he smokes cigs. Takes HFA, dulera and spiriva  HTN - BP controlled on coreg  CHF/hyperlipidemia - stable on lasix, BB. He takes  lipitor  Chronic pain/PAD - on gabapentin, tramadol and zanaflex  OSA - unable to tolerate CPAP mask  Hx depression/anxiety - stable off meds  Past Medical History  Diagnosis Date  . Depression   . Hypertension   . Hyperlipidemia   . Myocardial infarction ([place]) 2000  . CHF (congestive heart failure) ([place])   . COPD (chronic obstructive pulmonary disease) ([place])   . Peripheral vascular disease, unspecified ([place])     08/20/10 doppler: increase in right ABI post-op. Left ABI stable. S/P bi-fem bypass surgery  . GERD (gastroesophageal reflux disease)   . Asthma   . Anxiety   . History of DVT of lower  extremity   . CAD (coronary artery disease) 05/27/10    Cath: severe single vessell CAD left cx midportion obtuse marginal 2 to 3.  . Shortness of breath   . Pneumonia 04/04/2012  . Arthritis   . OSA on CPAP   . Hyperlipemia   . COPD (chronic obstructive pulmonary disease) ([place])   . Alcohol abuse     H/O  . Tobacco abuse   . Orbital fracture ([place]) 12/2012  . Stroke [facility])     Past Surgical History  Procedure Laterality Date  . Femoral bypass  08/19/10    Right Fem-Pop  . Aortogram w/ ptca  09/292003  . Eye surgery    . Back surgery    . Total knee arthroplasty    . Cholecystectomy    . Cardiac catheterization  05/27/10    severe CAD left cx  . Abdoninal ao angio & bifem angio  05/27/10    Patent graft, occluded bil stents with no retrograde flow into the hypogastric arteries. 100% occl left ant. tibial artery, 70% to 80% to 100% stenosis right superficial fem artery above adductor canal. 100 % occl right ant. tibial vessell  . Esophagogastric fundoplication    . Tee without cardioversion N/A 08/29/2014    Procedure: TRANSESOPHAGEAL ECHOCARDIOGRAM (TEE);  Surgeon: Josue Hector, MD;  Location: [place];  Service: Cardiovascular;  Laterality: N/A;  .  Peripheral vascular catheterization N/A 12/10/2014    Procedure: Abdominal Aortogram;  Surgeon: Angelia Mould, MD;  Location: [facility];  Service: Cardiovascular;  Laterality: N/A;  . Radiology with anesthesia N/A 02/08/2015    Procedure: RADIOLOGY WITH ANESTHESIA;  Surgeon: Luanne Bras, MD;  Location: [facility];  Service: Radiology;  Laterality: N/A;    Patient Care Team: Arnoldo Morale, MD as PCP - General (Family Medicine) Rosalin Hawking, MD as Consulting Physician (Neurology)  Social History   Social History  . Marital Status: Single    Spouse Name: N/A  . Number of Children: N/A  . Years of Education: N/A   Occupational History  . Not on file.   Social History Main Topics  . Smoking status: Current  Every Day Smoker -- 1.00 packs/day for 50 years    Types: Cigars, Cigarettes    Start date: 02/09/1961  . Smokeless tobacco: Former Systems developer    Types: Chew     Comment: 2 ppd, full flavor  . Alcohol Use: 0.6 oz/week    1 Cans of beer per week     Comment: 10/24/14 no drinking for 2 weeks  . Drug Use: No  . Sexual Activity: Yes   Other Topics Concern  . Not on file   Social History Narrative   Recovering alcoholic     reports that he has been smoking Cigars and Cigarettes.  He started smoking about 54 years ago. He has a 50 pack-year smoking history. He has quit using smokeless tobacco. His smokeless tobacco use included Chew. He reports that he drinks about 0.6 oz of alcohol per week. He reports that he does not use illicit drugs.  Family History  Problem Relation Age of Onset  . Heart attack Mother   . Cirrhosis Father   . Heart failure Mother   . Heart failure Brother   . Cancer Brother   . Hypertension Neg Hx     UNKNOWN  . Stroke Neg Hx     UNKNOWN   Family Status  Relation Status Death Age  . Mother Deceased 59    heart attack  . Father Deceased     alcoholism  . Brother Deceased     Heart Disease, Cancer, Alcoholism  . Brother Deceased     Cancer  . Brother Deceased     Unknown    Immunization History  Administered Date(s) Administered  . Pneumococcal Polysaccharide-23 08/02/2014    Allergies  Allergen Reactions  . Darvocet [Propoxyphene N-Acetaminophen] Hives  . Haldol [Haloperidol Decanoate] Hives  . Acetaminophen Nausea Only    Upset stomach, tolerates Hydrocodone/APAP if taken with food Upset stomach, tolerates Hydrocodone/APAP if taken with food  . Norco [Hydrocodone-Acetaminophen] Nausea And Vomiting    Tolerates if taken with food    Medications: Patient's Medications  New Prescriptions   No medications on file  Previous Medications   ALBUTEROL (PROVENTIL HFA;VENTOLIN HFA) 108 (90 BASE) MCG/ACT INHALER    Inhale 2 puffs into the lungs every  6 (six) hours as needed for wheezing or shortness of breath.   ALBUTEROL (PROVENTIL) (2.5 MG/3ML) 0.083% NEBULIZER SOLUTION    Take 3 mLs (2.5 mg total) by nebulization 2 (two) times daily as needed for wheezing or shortness of breath.   ASPIRIN 325 MG EC TABLET    Take 1 tablet (325 mg total) by mouth daily.   ATORVASTATIN (LIPITOR) 40 MG TABLET    Take 2 tablets (80 mg total) by mouth daily at 6 PM.  BESIFLOXACIN HCL (BESIVANCE) 0.6 % SUSP    Place 1 drop into the left eye 2 (two) times daily.   CARVEDILOL (COREG) 6.25 MG TABLET    Take 2 tablets (12.5 mg total) by mouth 2 (two) times daily with a meal.   CLOPIDOGREL (PLAVIX) 75 MG TABLET    Take 1 tablet (75 mg total) by mouth daily.   FUROSEMIDE (LASIX) 40 MG TABLET    Take 1 tablet (40 mg total) by mouth 2 (two) times daily.   GABAPENTIN (NEURONTIN) 300 MG CAPSULE    Take 1 capsule (300 mg total) by mouth 3 (three) times daily.   MOMETASONE-FORMOTEROL (DULERA) 200-5 MCG/ACT AERO    Inhale 2 puffs into the lungs 2 (two) times daily.   PANTOPRAZOLE (PROTONIX) 40 MG TABLET    Take 1 tablet (40 mg total) by mouth daily.   PREDNISONE (DELTASONE) 10 MG TABLET    Take 50 mg (5 tablets) by mouth daily for 1 day, then, Take 40 mg (4 tablets) by mouth daily for 1 day, then, Take 30 mg (3 tablets) by mouth daily for 1 day, then, Take 20 mg (2 tablets) by mouth daily for 1 day, then, Take 10 mg (1 tablets) by mouth daily for 1 day, then stop   TIOTROPIUM (SPIRIVA) 18 MCG INHALATION CAPSULE    Place 1 capsule (18 mcg total) into inhaler and inhale daily.   TIZANIDINE (ZANAFLEX) 4 MG TABLET    Take 1 tablet (4 mg total) by mouth 3 (three) times daily.   TRAMADOL (ULTRAM) 50 MG TABLET    Take 1 tablet (50 mg total) by mouth every 8 (eight) hours as needed.  Modified Medications   No medications on file  Discontinued Medications   No medications on file    Review of Systems  Musculoskeletal: Positive for arthralgias and gait problem.  Neurological:  Positive for weakness.  All other systems reviewed and are negative.   Filed Vitals:   02/14/15 1703  BP: 120/64  Pulse: 64  Temp: 98.3 F (36.8 C)  SpO2: 97%   There is no weight on file to calculate BMI.  Physical Exam  Constitutional: He is oriented to person, place, and time. He appears well-developed and well-nourished.  lying in bed in NAD  HENT:  Mouth/Throat: Oropharynx is clear and moist.  Eyes: EOM are normal. Pupils are equal, round, and reactive to light. No scleral icterus.  Neck: Neck supple. Carotid bruit is present (right).  Cardiovascular: Normal rate and intact distal pulses.  An irregular rhythm present. Exam reveals no gallop and no friction rub.   Murmur heard.  Systolic murmur is present with a grade of 1/6  Pulses:      Dorsalis pedis pulses are 0 on the right side, and 0 on the left side.       Posterior tibial pulses are 0 on the right side, and 0 on the left side.  no distal LE swelling. No calf TTP  Pulmonary/Chest: Effort normal and breath sounds normal. He has no wheezes. He has no rales. He exhibits no tenderness.  Abdominal: Soft. Bowel sounds are normal. He exhibits no distension, no abdominal bruit, no pulsatile midline mass and no mass. There is no tenderness. There is no rebound and no guarding.  Musculoskeletal: He exhibits edema and tenderness.  Lymphadenopathy:    He has no cervical adenopathy.  Neurological: He is alert and oriented to person, place, and time.  Skin: Skin is warm and dry. No rash  noted.  Psychiatric: He has a normal mood and affect. His behavior is normal. Thought content normal.     Labs reviewed: Admission on 02/06/2015, Discharged on 02/13/2015  Component Date Value Ref Range Status  . Sodium 02/06/2015 145  135 - 145 mmol/L Final  . Potassium 02/06/2015 4.3  3.5 - 5.1 mmol/L Final  . Chloride 02/06/2015 110  101 - 111 mmol/L Final  . CO2 02/06/2015 25  22 - 32 mmol/L Final  . Glucose, Bld 02/06/2015 115* 65 - 99  mg/dL Final  . BUN 02/06/2015 9  6 - 20 mg/dL Final  . Creatinine, Ser 02/06/2015 0.92  0.61 - 1.24 mg/dL Final  . Calcium 02/06/2015 10.0  8.9 - 10.3 mg/dL Final  . Total Protein 02/06/2015 6.2* 6.5 - 8.1 g/dL Final  . Albumin 02/06/2015 3.7  3.5 - 5.0 g/dL Final  . AST 02/06/2015 16  15 - 41 U/L Final  . ALT 02/06/2015 17  17 - 63 U/L Final  . Alkaline Phosphatase 02/06/2015 66  38 - 126 U/L Final  . Total Bilirubin 02/06/2015 0.5  0.3 - 1.2 mg/dL Final  . GFR calc non Af Amer 02/06/2015 >60  >60 mL/min Final  . GFR calc Af Amer 02/06/2015 >60  >60 mL/min Final   Comment: (NOTE) The eGFR has been calculated using the CKD EPI equation. This calculation has not been validated in all clinical situations. eGFR's persistently <60 mL/min signify possible Chronic Kidney Disease.   . Anion gap 02/06/2015 10  5 - 15 Final  . WBC 02/06/2015 5.3  4.0 - 10.5 K/uL Final  . RBC 02/06/2015 5.03  4.22 - 5.81 MIL/uL Final  . Hemoglobin 02/06/2015 15.6  13.0 - 17.0 g/dL Final  . HCT 02/06/2015 47.2  39.0 - 52.0 % Final  . MCV 02/06/2015 93.8  78.0 - 100.0 fL Final  . MCH 02/06/2015 31.0  26.0 - 34.0 pg Final  . MCHC 02/06/2015 33.1  30.0 - 36.0 g/dL Final  . RDW 02/06/2015 14.2  11.5 - 15.5 % Final  . Platelets 02/06/2015 125* 150 - 400 K/uL Final  . Neutrophils Relative % 02/06/2015 54   Final  . Neutro Abs 02/06/2015 2.9  1.7 - 7.7 K/uL Final  . Lymphocytes Relative 02/06/2015 28   Final  . Lymphs Abs 02/06/2015 1.5  0.7 - 4.0 K/uL Final  . Monocytes Relative 02/06/2015 13   Final  . Monocytes Absolute 02/06/2015 0.7  0.1 - 1.0 K/uL Final  . Eosinophils Relative 02/06/2015 4   Final  . Eosinophils Absolute 02/06/2015 0.2  0.0 - 0.7 K/uL Final  . Basophils Relative 02/06/2015 1   Final  . Basophils Absolute 02/06/2015 0.0  0.0 - 0.1 K/uL Final  . Hgb A1c MFr Bld 02/06/2015 6.7* 4.8 - 5.6 % Final   Comment: (NOTE)         Pre-diabetes: 5.7 - 6.4         Diabetes: >6.4         Glycemic  control for adults with diabetes: <7.0   . Mean Plasma Glucose 02/06/2015 146   Final   Comment: (NOTE) Performed At: [facility] [place], [place] [id number] Lindon Romp MD [id number]   . Cholesterol 02/06/2015 231* 0 - 200 mg/dL Final  . Triglycerides 02/06/2015 172* <150 mg/dL Final  . HDL 02/06/2015 47  >40 mg/dL Final  . Total CHOL/HDL Ratio 02/06/2015 4.9   Final  . VLDL 02/06/2015 34  0 -  40 mg/dL Final  . LDL Cholesterol 02/06/2015 150* 0 - 99 mg/dL Final   Comment:        Total Cholesterol/HDL:CHD Risk Coronary Heart Disease Risk Table                     Men   Women  1/2 Average Risk   3.4   3.3  Average Risk       5.0   4.4  2 X Average Risk   9.6   7.1  3 X Average Risk  23.4   11.0        Use the calculated Patient Ratio above and the CHD Risk Table to determine the patient's CHD Risk.        ATP III CLASSIFICATION (LDL):  <100     mg/dL   Optimal  100-129  mg/dL   Near or Above                    Optimal  130-159  mg/dL   Borderline  160-189  mg/dL   High  >190     mg/dL   Very High   . MRSA, PCR 02/07/2015 NEGATIVE  NEGATIVE Final  . Staphylococcus aureus 02/07/2015 NEGATIVE  NEGATIVE Final   Comment:        The Xpert SA Assay (FDA approved for NASAL specimens in patients over 6 years of age), is one component of a comprehensive surveillance program.  Test performance has been validated by [facility] for patients greater than or equal to 72 year old. It is not intended to diagnose infection nor to guide or monitor treatment.   . Prothrombin Time 02/07/2015 14.2  11.6 - 15.2 seconds Final  . INR 02/07/2015 1.08  0.00 - 1.49 Final  . aPTT 02/07/2015 28  24 - 37 seconds Final  . Platelet Function  P2Y12 02/07/2015 207  194 - 418 PRU Final   Comment:        The literature has shown a direct correlation of PRU values over 230 with higher risks of thrombotic events.  Lower PRU values are associated with platelet  inhibition.   . Opiates 02/07/2015 NONE DETECTED  NONE DETECTED Final  . Cocaine 02/07/2015 NONE DETECTED  NONE DETECTED Final  . Benzodiazepines 02/07/2015 NONE DETECTED  NONE DETECTED Final  . Amphetamines 02/07/2015 NONE DETECTED  NONE DETECTED Final  . Tetrahydrocannabinol 02/07/2015 NONE DETECTED  NONE DETECTED Final  . Barbiturates 02/07/2015 NONE DETECTED  NONE DETECTED Final   Comment:        DRUG SCREEN FOR MEDICAL PURPOSES ONLY.  IF CONFIRMATION IS NEEDED FOR ANY PURPOSE, NOTIFY LAB WITHIN 5 DAYS.        LOWEST DETECTABLE LIMITS FOR URINE DRUG SCREEN Drug Class       Cutoff (ng/mL) Amphetamine      1000 Barbiturate      200 Benzodiazepine   323 Tricyclics       557 Opiates          300 Cocaine          300 THC              50   . WBC 02/08/2015 5.4  4.0 - 10.5 K/uL Final  . RBC 02/08/2015 4.81  4.22 - 5.81 MIL/uL Final  . Hemoglobin 02/08/2015 14.8  13.0 - 17.0 g/dL Final  . HCT 02/08/2015 45.5  39.0 - 52.0 % Final  . MCV 02/08/2015 94.6  78.0 -  100.0 fL Final  . MCH 02/08/2015 30.8  26.0 - 34.0 pg Final  . MCHC 02/08/2015 32.5  30.0 - 36.0 g/dL Final  . RDW 02/08/2015 14.3  11.5 - 15.5 % Final  . Platelets 02/08/2015 114* 150 - 400 K/uL Final   PLATELET COUNT CONFIRMED BY SMEAR  . Neutrophils Relative % 02/08/2015 55   Final  . Neutro Abs 02/08/2015 3.0  1.7 - 7.7 K/uL Final  . Lymphocytes Relative 02/08/2015 26   Final  . Lymphs Abs 02/08/2015 1.4  0.7 - 4.0 K/uL Final  . Monocytes Relative 02/08/2015 14   Final  . Monocytes Absolute 02/08/2015 0.7  0.1 - 1.0 K/uL Final  . Eosinophils Relative 02/08/2015 5   Final  . Eosinophils Absolute 02/08/2015 0.3  0.0 - 0.7 K/uL Final  . Basophils Relative 02/08/2015 1   Final  . Basophils Absolute 02/08/2015 0.0  0.0 - 0.1 K/uL Final  . Prothrombin Time 02/08/2015 14.1  11.6 - 15.2 seconds Final  . INR 02/08/2015 1.07  0.00 - 1.49 Final  . aPTT 02/08/2015 28  24 - 37 seconds Final  . Sodium 02/08/2015 144  135 - 145  mmol/L Final  . Potassium 02/08/2015 4.0  3.5 - 5.1 mmol/L Final  . Chloride 02/08/2015 109  101 - 111 mmol/L Final  . CO2 02/08/2015 28  22 - 32 mmol/L Final  . Glucose, Bld 02/08/2015 124* 65 - 99 mg/dL Final  . BUN 02/08/2015 8  6 - 20 mg/dL Final  . Creatinine, Ser 02/08/2015 0.82  0.61 - 1.24 mg/dL Final  . Calcium 02/08/2015 9.6  8.9 - 10.3 mg/dL Final  . Total Protein 02/08/2015 6.0* 6.5 - 8.1 g/dL Final  . Albumin 02/08/2015 3.4* 3.5 - 5.0 g/dL Final  . AST 02/08/2015 13* 15 - 41 U/L Final  . ALT 02/08/2015 13* 17 - 63 U/L Final  . Alkaline Phosphatase 02/08/2015 63  38 - 126 U/L Final  . Total Bilirubin 02/08/2015 0.7  0.3 - 1.2 mg/dL Final  . GFR calc non Af Amer 02/08/2015 >60  >60 mL/min Final  . GFR calc Af Amer 02/08/2015 >60  >60 mL/min Final   Comment: (NOTE) The eGFR has been calculated using the CKD EPI equation. This calculation has not been validated in all clinical situations. eGFR's persistently <60 mL/min signify possible Chronic Kidney Disease.   . Anion gap 02/08/2015 7  5 - 15 Final  . Glucose-Capillary 02/08/2015 118* 65 - 99 mg/dL Final  . Activated Clotting Time 02/08/2015 157   Final  . Activated Clotting Time 02/08/2015 157   Final  . Activated Clotting Time 02/08/2015 168   Final  . Heparin Unfractionated 02/08/2015 <0.10* 0.30 - 0.70 IU/mL Final   Comment:        IF HEPARIN RESULTS ARE BELOW EXPECTED VALUES, AND PATIENT DOSAGE HAS BEEN CONFIRMED, SUGGEST FOLLOW UP TESTING OF ANTITHROMBIN III LEVELS.   . WBC 02/08/2015 5.7  4.0 - 10.5 K/uL Final  . RBC 02/08/2015 4.37  4.22 - 5.81 MIL/uL Final  . Hemoglobin 02/08/2015 13.0  13.0 - 17.0 g/dL Final  . HCT 02/08/2015 41.3  39.0 - 52.0 % Final  . MCV 02/08/2015 94.5  78.0 - 100.0 fL Final  . MCH 02/08/2015 29.7  26.0 - 34.0 pg Final  . MCHC 02/08/2015 31.5  30.0 - 36.0 g/dL Final  . RDW 02/08/2015 14.4  11.5 - 15.5 % Final  . Platelets 02/08/2015 106* 150 - 400 K/uL Final  Comment: REPEATED TO  VERIFY SPECIMEN CHECKED FOR CLOTS CONSISTENT WITH PREVIOUS RESULT   . Neutrophils Relative % 02/08/2015 69   Final  . Neutro Abs 02/08/2015 3.9  1.7 - 7.7 K/uL Final  . Lymphocytes Relative 02/08/2015 19   Final  . Lymphs Abs 02/08/2015 1.1  0.7 - 4.0 K/uL Final  . Monocytes Relative 02/08/2015 10   Final  . Monocytes Absolute 02/08/2015 0.6  0.1 - 1.0 K/uL Final  . Eosinophils Relative 02/08/2015 2   Final  . Eosinophils Absolute 02/08/2015 0.1  0.0 - 0.7 K/uL Final  . Basophils Relative 02/08/2015 0   Final  . Basophils Absolute 02/08/2015 0.0  0.0 - 0.1 K/uL Final  . WBC 02/09/2015 5.5  4.0 - 10.5 K/uL Final  . RBC 02/09/2015 4.35  4.22 - 5.81 MIL/uL Final  . Hemoglobin 02/09/2015 12.9* 13.0 - 17.0 g/dL Final  . HCT 02/09/2015 41.0  39.0 - 52.0 % Final  . MCV 02/09/2015 94.3  78.0 - 100.0 fL Final  . MCH 02/09/2015 29.7  26.0 - 34.0 pg Final  . MCHC 02/09/2015 31.5  30.0 - 36.0 g/dL Final  . RDW 02/09/2015 14.4  11.5 - 15.5 % Final  . Platelets 02/09/2015 108* 150 - 400 K/uL Final   CONSISTENT WITH PREVIOUS RESULT  . Neutrophils Relative % 02/09/2015 61   Final  . Neutro Abs 02/09/2015 3.4  1.7 - 7.7 K/uL Final  . Lymphocytes Relative 02/09/2015 22   Final  . Lymphs Abs 02/09/2015 1.2  0.7 - 4.0 K/uL Final  . Monocytes Relative 02/09/2015 13   Final  . Monocytes Absolute 02/09/2015 0.7  0.1 - 1.0 K/uL Final  . Eosinophils Relative 02/09/2015 4   Final  . Eosinophils Absolute 02/09/2015 0.2  0.0 - 0.7 K/uL Final  . Basophils Relative 02/09/2015 0   Final  . Basophils Absolute 02/09/2015 0.0  0.0 - 0.1 K/uL Final  . Sodium 02/09/2015 147* 135 - 145 mmol/L Final  . Potassium 02/09/2015 3.8  3.5 - 5.1 mmol/L Final  . Chloride 02/09/2015 112* 101 - 111 mmol/L Final  . CO2 02/09/2015 28  22 - 32 mmol/L Final  . Glucose, Bld 02/09/2015 110* 65 - 99 mg/dL Final  . BUN 02/09/2015 <5* 6 - 20 mg/dL Final  . Creatinine, Ser 02/09/2015 0.70  0.61 - 1.24 mg/dL Final  . Calcium  02/09/2015 9.2  8.9 - 10.3 mg/dL Final  . GFR calc non Af Amer 02/09/2015 >60  >60 mL/min Final  . GFR calc Af Amer 02/09/2015 >60  >60 mL/min Final   Comment: (NOTE) The eGFR has been calculated using the CKD EPI equation. This calculation has not been validated in all clinical situations. eGFR's persistently <60 mL/min signify possible Chronic Kidney Disease.   . Anion gap 02/09/2015 7  5 - 15 Final  . Heparin Unfractionated 02/09/2015 0.11* 0.30 - 0.70 IU/mL Final   Comment:        IF HEPARIN RESULTS ARE BELOW EXPECTED VALUES, AND PATIENT DOSAGE HAS BEEN CONFIRMED, SUGGEST FOLLOW UP TESTING OF ANTITHROMBIN III LEVELS.   [name] Sodium 02/10/2015 144  135 - 145 mmol/L Final  . Potassium 02/10/2015 3.8  3.5 - 5.1 mmol/L Final  . Chloride 02/10/2015 108  101 - 111 mmol/L Final  . CO2 02/10/2015 30  22 - 32 mmol/L Final  . Glucose, Bld 02/10/2015 116* 65 - 99 mg/dL Final  . BUN 02/10/2015 11  6 - 20 mg/dL Final  . Creatinine, Ser 02/10/2015  0.90  0.61 - 1.24 mg/dL Final  . Calcium 02/10/2015 9.7  8.9 - 10.3 mg/dL Final  . GFR calc non Af Amer 02/10/2015 >60  >60 mL/min Final  . GFR calc Af Amer 02/10/2015 >60  >60 mL/min Final   Comment: (NOTE) The eGFR has been calculated using the CKD EPI equation. This calculation has not been validated in all clinical situations. eGFR's persistently <60 mL/min signify possible Chronic Kidney Disease.   . Anion gap 02/10/2015 6  5 - 15 Final  . WBC 02/11/2015 6.5  4.0 - 10.5 K/uL Final  . RBC 02/11/2015 4.40  4.22 - 5.81 MIL/uL Final  . Hemoglobin 02/11/2015 13.2  13.0 - 17.0 g/dL Final  . HCT 02/11/2015 41.2  39.0 - 52.0 % Final  . MCV 02/11/2015 93.6  78.0 - 100.0 fL Final  . MCH 02/11/2015 30.0  26.0 - 34.0 pg Final  . MCHC 02/11/2015 32.0  30.0 - 36.0 g/dL Final  . RDW 02/11/2015 14.2  11.5 - 15.5 % Final  . Platelets 02/11/2015 132* 150 - 400 K/uL Final  . Uric Acid, Serum 02/12/2015 8.6* 4.4 - 7.6 mg/dL Final  Admission on 12/10/2014,  Discharged on 12/10/2014  Component Date Value Ref Range Status  . Sodium 12/10/2014 144  135 - 145 mmol/L Final  . Potassium 12/10/2014 4.0  3.5 - 5.1 mmol/L Final  . Chloride 12/10/2014 105  101 - 111 mmol/L Final  . BUN 12/10/2014 19  6 - 20 mg/dL Final  . Creatinine, Ser 12/10/2014 0.90  0.61 - 1.24 mg/dL Final  . Glucose, Bld 12/10/2014 127* 65 - 99 mg/dL Final  . Calcium, Ion 12/10/2014 1.26  1.13 - 1.30 mmol/L Final  . TCO2 12/10/2014 25  0 - 100 mmol/L Final  . Hemoglobin 12/10/2014 16.7  13.0 - 17.0 g/dL Final  . HCT 12/10/2014 49.0  39.0 - 52.0 % Final  . Glucose-Capillary 12/10/2014 126* 65 - 99 mg/dL Final  Appointment on 12/04/2014  Component Date Value Ref Range Status  . Rest HR 12/04/2014 76   Final  . Rest BP 12/04/2014 153/81   Final  . Peak HR 12/04/2014 94   Final  . Peak BP 12/04/2014 144/76   Final  . LV Systolic Volume 56/38/9373 62   Final  . TID 12/04/2014 1.05   Final  . LV Diastolic Volume 42/87/6811 118   Final  . LHR 12/04/2014 0.31   Final  . SSS 12/04/2014 3   Final  . SRS 12/04/2014 3   Final  . SDS 12/04/2014 0   Final    Dg Chest 2 View  02/06/2015  CLINICAL DATA:  CVA. EXAM: CHEST  2 VIEW COMPARISON:  08/19/2014 chest radiograph. FINDINGS: Stable cardiomediastinal silhouette with normal heart size. No pneumothorax. No pleural effusion. Clear lungs, with no focal lung consolidation and no pulmonary edema. IMPRESSION: No active cardiopulmonary disease. Electronically Signed   By: Ilona Sorrel M.D.   On: 02/06/2015 21:54   Ct Head Wo Contrast  02/06/2015  CLINICAL DATA:  Left-sided weakness for 2-3 days. History of multiple strokes. EXAM: CT HEAD WITHOUT CONTRAST TECHNIQUE: Contiguous axial images were obtained from the base of the skull through the vertex without intravenous contrast. COMPARISON:  08/27/2014 FINDINGS: Mild chronic small vessel disease throughout the deep white matter. No acute intracranial abnormality. Specifically, no hemorrhage,  hydrocephalus, mass lesion, acute infarction, or significant intracranial injury. No acute calvarial abnormality. Opacified left maxillary sinus is stable since prior study. Postoperative changes in  the left anterior maxillary wall and floor the left orbit. Mastoid air cells are clear. IMPRESSION: Chronic small vessel disease throughout the deep white matter. No acute intracranial abnormality. Electronically Signed   By: Rolm Baptise M.D.   On: 02/06/2015 12:42   Mr Brain Wo Contrast  02/06/2015  CLINICAL DATA:  65 year old male with left side weakness beginning 2 days ago. Initial encounter. EXAM: MRI HEAD WITHOUT CONTRAST TECHNIQUE: Multiplanar, multiecho pulse sequences of the brain and surrounding structures were obtained without intravenous contrast. COMPARISON:  Head CT without contrast 1233 hours today. Brain MRI 08/27/2014. FINDINGS: Chronically abnormal appearance of the right ICA siphon, but with suggestion of reconstituted flow at the right ICA terminus. Overall Major intracranial vascular flow voids are stable since July. Patchy mostly cortically based restricted diffusion in the right superior frontal gyrus involving the MCA territory (some MCA/ACA watershed area in the pre motor region). White matter extension to the right corona radiata on series 4, image 18. No contralateral left hemisphere or posterior fossa restricted diffusion. Associated patchy T2 and FLAIR hyperintensity in the acutely affected areas. No definite associated acute hemorrhage, although there is probably vascular related T2* asymmetry along the medial right hemisphere (series 8, images 16-19) which is new since July. Chronic micro hemorrhage in the right occipital lobe is unchanged. Widespread scattered cerebral white matter T2 and FLAIR hyperintensity elsewhere in both hemispheres, greater on the right, then is stable since July. No midline shift, mass effect, evidence of mass lesion, ventriculomegaly, extra-axial collection  or acute intracranial hemorrhage. Cervicomedullary junction and pituitary are within normal limits. Negative visualized cervical spine. Stable paranasal sinuses and mastoids. Interval postoperative changes to the left globe, otherwise stable orbit and scalp soft tissues. Visualized bone marrow signal is within normal limits. IMPRESSION: 1. 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 2. Chronic poor flow in the right ICA - See also 08/27/2014 [facility] report. Electronically Signed   By: Genevie Ann M.D.   On: 02/06/2015 16:19   Ir Sherre Lain Stent Cerv Carotid W/o Emb-prot Mod Sed  02/13/2015  CLINICAL DATA:  Worsening left-sided weakness, with dysarthria and left facial droop. 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 Patient is symptomatic despite aggressive medical anti-platelet therapy. EXAM: IR ANGIO VERTEBRAL SEL SUBCLAVIAN INNOMINATE UNI RIGHT MOD SED; INTRAVSC STENT CERV CAROTID W/O EMB-PROT; IR ENDOVASCULAR INTRACRANIAL INFUSION OTHER THAN THROMBOLYSIS ARTERIAL INCLUDE DIAG ANGIO; INTRACRANIAL STENT (INCL PTA); IR ANGIO INTRA EXTRACRAN SEL COM CAROTID INNOMINATE UNI LEFT MOD SED BILATERAL COMMON CAROTID ARTERIOGRAMS FOLLOWED BY ENDOVASCULAR COMPLETE REVASCULARIZATION OF OCCLUDED RIGHT INTERNAL CAROTID ARTERY SUPRACLINOID SEGMENT, WITH STENT-ASSISTED ANGIOPLASTY, FOLLOWED BY PLACEMENT OF A RIGHT INTERNAL CAROTID ARTERY PROXIMAL STENT BECAUSE OF A SEVERE STENOSES ASSOCIATED WITH LARGE ULCERATIVE PLAQUE. RIGHT VERTEBRAL ARTERY ANGIOGRAM: ANESTHESIA/SEDATION: General anesthesia. MEDICATIONS: As per general anesthesia. CONTRAST:  70 mL OMNIPAQUE IOHEXOL 300 MG/ML  SOLN PROCEDURE: Following a full explanation of the procedure along with the potential associated complications, an informed witnessed consent was obtained. The patient was put under general anesthesia by the Department of Anesthesiology at [facility]. The right groin was prepped and draped in the usual sterile fashion. Thereafter using modified Seldinger technique, transfemoral access into the right common femoral artery was obtained using a micropuncture set. Over a 0.035 inch guidewire, a 5 French Pinnacle sheath was inserted. Through this, and also over a 0.035 inch guidewire, a 5 French JB1 catheter was advanced to the aortic  arch region and selectively positioned in the left common carotid artery, the right subclavian artery and the right common carotid artery. Arteriograms were then performed intracranially and extracranially. The patient tolerated the procedure well. COMPLICATIONS: None immediate. FINDINGS: The left common carotid arteriogram demonstrates nonvisualization of the left external carotid artery or its branches. The left internal carotid artery at the bulb demonstrated a smooth plaque with minimal ulceration. No associated narrowing by the NASCET criteria was seen. The vessel was otherwise seen to opacify to the cranial skull base. The petrous segment appeared widely patent. The cavernous segment proximally demonstrated moderate stenoses associated with a small pseudoaneurysm. The distal cavernous segment and the supraclinoid segments were seen to be widely patent. Left middle and the left anterior cerebral arteries opacified normally into the capillary and the venous phases. There was noted prompt opacification of the right anterior cerebral artery A2 segment, the A1 segment, and also partially the right middle cerebral artery distribution via the anterior communicating artery from the left internal carotid artery injection. The right subclavian arteriogram demonstrated the origin of the right vertebral artery to be normal. The vessel was seen to opacify normally to the cranial skull base to opacify in the right vertebrobasilar junction, the proximal basilar artery and the right posterior-inferior cerebellar artery. Suggestion of  retrograde partial opacification of the right internal carotid artery supraclinoid segment from the right posterior communicating artery was noted. The right common carotid arteriogram demonstrates a complex ulcerative plaque at the right internal carotid artery bulb and distally associated with a significantly narrowed lumen of the right internal carotid artery proximally just at the level of the bulb and distally. More distally, slow ascent of contrast was seen associated with focal areas of narrowing involving the petrous-cavernous segment. Complete angiographic occlusion of the right internal carotid artery at the level of the ophthalmic artery was noted. The delayed arterial phase demonstrated no evidence of distal reconstitution. ENDOVASCULAR COMPLETE REVASCULARIZATION OF THE OCCLUDED RIGHT INTERNAL CAROTID ARTERY SUPRACLINOID SEGMENT WITH STENT ASSISTED ANGIOPLASTY, AND THE RIGHT INTERNAL CAROTID ARTERY PROXIMALLY WITH PLACEMENT OF A STENT: The diagnostic JB1 catheter in the right common carotid artery was then exchanged over a 0.035 inch 300 cm Rosen exchange guidewire for a 6 [place] 85 cm Cook shuttle sheath using biplane roadmap technique and constant fluoroscopic guidance. The [facility] shuttle sheath had to be positioned through an exchange 8 [place] Pinnacle sheath because of previous scarring related to the surgery. Good aspiration was obtained from the side port of the Tuohy-Borst at the hub of the [place] shuttle sheath which was positioned just proximal to the right common carotid bifurcation. A gentle contrast injection demonstrated no evidence of spasms, dissections or of intraluminal filling defects. At this time in a coaxial manner and with constant heparinized saline infusion using biplane roadmap technique and constant fluoroscopic guidance, rapid transit two marker microcatheter was advanced over a 0.014 inch Softip Synchro micro guidewire to the petrous portion of the right internal carotid artery.  The micro guidewire was then removed. Through the microcatheter just in the cavernous segment, approximately 6 mg of superselective intra-arterial Integrelin was then infused over approximately three minutes. A control arteriogram performed through the microcatheter demonstrated no change in the occluded right internal carotid artery supraclinoid segment. This time, using a torque device, a Synchro soft micro guidewire was gently manipulated without difficulty through the occluded right internal carotid artery supraclinoid segment and advanced into the M2 region of the inferior division of the right middle cerebral artery. This  was then followed by the microcatheter. The micro guidewire was removed. Good aspiration was obtained from the hub of the microcatheter. The microcatheter in turn was then exchanged for a 0.014 inch Softip 300 cm exchange Transend EX micro guidewire using biplane roadmap technique and constant fluoroscopic guidance. The microcatheter was retrieved and removed. The distal end of the exchange micro guidewire which was not in the M2 region of the superior division of the right middle cerebral artery had a J-tip configuration to avoid dissections or inducing spasm, a control arteriogram performed through the [facility] shuttle sheath in the right common carotid artery demonstrated now antegrade flow through the previously occluded supraclinoid right ICA. Flow was noted into the MCA branches. A tight narrowing was now at the supraclinoid segment. At this time, a 2.5 x 15 mm Gateway angioplasty balloon microcatheter which had been purged with 75% contrast and 25% heparinized saline infusion was then advanced in a coaxial manner and with constant heparinized saline infusion and positioned optimally across the tight narrowing of the supraclinoid right ICA. Once confirmed, this was then gradually expanded using a micro inflation syringe device via micro tubing to approximately 5.2 atmospheres. Once there,  it was maintained for approximately 1 minute 20 seconds. The balloon was then deflated and removed using constant fluoroscopic guidance with roadmap technique to ensure safe positioning of the tip of the exchange micro guidewire in the M2 right MCA inferior branch. A control arteriogram performed through the [place] shuttle sheath demonstrated significantly improved caliber and flow through the angioplastied segment. Waste remained. This prompted the placement of a Wingspan 4 x 20 mm stent device. Measurements had been performed of the supraclinoid right ICA, proximally and distally. A 4 x 20 mm Wingspan stent system was prepped and purged with heparinized saline infusion proximally through the Tuohy-Borst and its delivery microcatheter hub. This was then locked into position. There on after using biplane roadmap technique and constant fluoroscopic guidance, in a coaxial manner and with constant heparinized saline fusion, the Wingspan stent delivery microcatheter and the stent delivery micro guidewire were then advanced without difficulty and positioned such that the distal and the proximal markers on the stent optimally placed across the site of the angioplastied segment. Once confirmed, the O rings on the delivery micro guidewire of the stent and the delivery microcatheter were then loosened. With slight forward gentle traction with the right hand on the stent stabilizing device, which had been advanced to above the proximal portion of the stent, with the left hand, the delivery microcatheter was then gently retrieved unsheathing the distal and proximal portion of the stent. Once delivered, the delivery system was retrieved and removed. The distal micro guidewire was maintained in its position. A control arteriogram performed through the 6 French Cook shuttle sheath in the right common carotid artery demonstrated excellent flow through the stented supraclinoid right ICA with now opacification of the right  middle and the right anterior cerebral artery. No filling defects were seen. Attention was now given to the right internal carotid artery proximally at the site of the complex ulcerative plaque associated with significant stenosis. The exchange Transend 014 inch micro guidewire was then retrieved such that the distal portion of the wire was now in the horizontal petrous segment of the right internal carotid artery. Measurements were then performed of the right internal carotid artery and right common carotid artery across the bulb with the ulcerative plaque. It was decided to place a 8/6 mm x 40 mm Xact  stent. This was then prepped and purged with heparinized saline infusion retrogradely. Using the rapid exchange system, the stent delivery system was then advanced without difficulty using biplane roadmap technique and constant fluoroscopic guidance in a coaxial manner and with constant heparinized saline infusion and positioned such that distal and proximal markers on the stent were optimally positioned. Once confirmed, the O-ring on the [place] shuttle sheath was slightly loosened. Under constant observation, the stent was then delivered without difficulty. With the distal wire held in safe constant position, the delivery microcatheter of the stent was then retrieved and removed. A control arteriogram was then performed through the [place] shuttle sheath which demonstrated excellent apposition and caliber of the stented segment of the right internal carotid artery. Given the wide expansion with minimal to no waste it was decided against post stent placement angioplasty. A control arteriogram was then performed at 15, 30 and 40 minutes post placement of the stent proximally. These continue to demonstrate excellent flow through the stented segment proximally and also intracranially in the supraclinoid region. No evidence of intraluminal filling defects or occlusions or spasm was noted. The wire was then  retrieved and removed under constant observation ensuring no sudden movements of the stent proximally. None was observed. During the procedure the patient was given an additional 2 mg of intra-arterial Integrelin through the guide catheter in the right common carotid artery. Also patient was given a total of approximately 3000 units of intravenous heparin. The ACT was maintained in the region of approximately 180 seconds throughout the procedure. The patient exhibited no neurological or hemodynamic changes throughout the procedure. The 6 [place] Cook shuttle sheath was then retrieved into the abdominal aorta and removed. The 8 French Pinnacle groin sheath was then retrieved and removed with the placement of an external closure device. The patient's general anesthesia was reversed and the patient was extubated without difficulty. Upon recovery, the patient exhibited no evidence of headaches, nausea, vomiting or any changes in his left-sided weakness compared to prior to the procedure. He was then transferred to the neuro ICU to continue on IV heparin, and to be carefully monitored neurologically and especially his blood pressures to be maintained within the parameters. Toward the latter end of the day, the patient was fully awake, alert, with no new neurological deficits. He continue to have left-sided weakness unchanged although he felt it might be a little bit better. He also complained of sore throat, on account of his intubation. The following morning, the patient's IV heparin was stopped as was his IV Cardene. He was started on advancement of p.o. intake. His right groin appeared soft. Patient's cervical collar was discontinued. His p.o. intake was to be increased and patient was to be mobilized as per the directions of the stroke team. He was started on aspirin 325 mg a day, and Plavix 75 mg a day. He was strongly advised to stop smoking and to return for follow-up in the clinic once discharged. Patient was also  asked to maintain adequate hydration. IMPRESSION: Status post endovascular complete revascularization of occluded right internal carotid artery supraclinoid segment with stent assisted angioplasty with placement of a Wingspan 4 x 20 mm stent, and proximally of the right internal carotid artery symptomatic stenoses with complex ulcerative plaque, by placement of a 8/6 mm x 40 mm Xact stent as described above. Electronically Signed   By: Luanne Bras M.D.   On: 02/12/2015 12:00   Ir Intra Cran Stent  02/13/2015  CLINICAL DATA:  Worsening left-sided weakness, with dysarthria and left facial droop. 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 Patient is symptomatic despite aggressive medical anti-platelet therapy. EXAM: IR ANGIO VERTEBRAL SEL SUBCLAVIAN INNOMINATE UNI RIGHT MOD SED; INTRAVSC STENT CERV CAROTID W/O EMB-PROT; IR ENDOVASCULAR INTRACRANIAL INFUSION OTHER THAN THROMBOLYSIS ARTERIAL INCLUDE DIAG ANGIO; INTRACRANIAL STENT (INCL PTA); IR ANGIO INTRA EXTRACRAN SEL COM CAROTID INNOMINATE UNI LEFT MOD SED BILATERAL COMMON CAROTID ARTERIOGRAMS FOLLOWED BY ENDOVASCULAR COMPLETE REVASCULARIZATION OF OCCLUDED RIGHT INTERNAL CAROTID ARTERY SUPRACLINOID SEGMENT, WITH STENT-ASSISTED ANGIOPLASTY, FOLLOWED BY PLACEMENT OF A RIGHT INTERNAL CAROTID ARTERY PROXIMAL STENT BECAUSE OF A SEVERE STENOSES ASSOCIATED WITH LARGE ULCERATIVE PLAQUE. RIGHT VERTEBRAL ARTERY ANGIOGRAM: ANESTHESIA/SEDATION: General anesthesia. MEDICATIONS: As per general anesthesia. CONTRAST:  70 mL OMNIPAQUE IOHEXOL 300 MG/ML  SOLN PROCEDURE: Following a full explanation of the procedure along with the potential associated complications, an informed witnessed consent was obtained. The patient was put under general anesthesia by the Department of Anesthesiology at [facility]. The right groin was prepped and draped in the usual sterile fashion. Thereafter using modified Seldinger technique,  transfemoral access into the right common femoral artery was obtained using a micropuncture set. Over a 0.035 inch guidewire, a 5 French Pinnacle sheath was inserted. Through this, and also over a 0.035 inch guidewire, a 5 French JB1 catheter was advanced to the aortic arch region and selectively positioned in the left common carotid artery, the right subclavian artery and the right common carotid artery. Arteriograms were then performed intracranially and extracranially. The patient tolerated the procedure well. COMPLICATIONS: None immediate. FINDINGS: The left common carotid arteriogram demonstrates nonvisualization of the left external carotid artery or its branches. The left internal carotid artery at the bulb demonstrated a smooth plaque with minimal ulceration. No associated narrowing by the NASCET criteria was seen. The vessel was otherwise seen to opacify to the cranial skull base. The petrous segment appeared widely patent. The cavernous segment proximally demonstrated moderate stenoses associated with a small pseudoaneurysm. The distal cavernous segment and the supraclinoid segments were seen to be widely patent. Left middle and the left anterior cerebral arteries opacified normally into the capillary and the venous phases. There was noted prompt opacification of the right anterior cerebral artery A2 segment, the A1 segment, and also partially the right middle cerebral artery distribution via the anterior communicating artery from the left internal carotid artery injection. The right subclavian arteriogram demonstrated the origin of the right vertebral artery to be normal. The vessel was seen to opacify normally to the cranial skull base to opacify in the right vertebrobasilar junction, the proximal basilar artery and the right posterior-inferior cerebellar artery. Suggestion of retrograde partial opacification of the right internal carotid artery supraclinoid segment from the right posterior communicating  artery was noted. The right common carotid arteriogram demonstrates a complex ulcerative plaque at the right internal carotid artery bulb and distally associated with a significantly narrowed lumen of the right internal carotid artery proximally just at the level of the bulb and distally. More distally, slow ascent of contrast was seen associated with focal areas of narrowing involving the petrous-cavernous segment. Complete angiographic occlusion of the right internal carotid artery at the level of the ophthalmic artery was noted. The delayed arterial phase demonstrated no evidence of distal reconstitution. ENDOVASCULAR COMPLETE REVASCULARIZATION OF THE OCCLUDED RIGHT INTERNAL CAROTID ARTERY SUPRACLINOID SEGMENT WITH STENT ASSISTED ANGIOPLASTY, AND THE RIGHT INTERNAL CAROTID ARTERY PROXIMALLY WITH PLACEMENT OF A STENT: The diagnostic JB1 catheter in the  right common carotid artery was then exchanged over a 0.035 inch 300 cm Constance Holster exchange guidewire for a 6 French 85 cm Cook shuttle sheath using biplane roadmap technique and constant fluoroscopic guidance. The [facility] shuttle sheath had to be positioned through an exchange 8 [place] Pinnacle sheath because of previous scarring related to the surgery. Good aspiration was obtained from the side port of the Tuohy-Borst at the hub of the [place] shuttle sheath which was positioned just proximal to the right common carotid bifurcation. A gentle contrast injection demonstrated no evidence of spasms, dissections or of intraluminal filling defects. At this time in a coaxial manner and with constant heparinized saline infusion using biplane roadmap technique and constant fluoroscopic guidance, rapid transit two marker microcatheter was advanced over a 0.014 inch Softip Synchro micro guidewire to the petrous portion of the right internal carotid artery. The micro guidewire was then removed. Through the microcatheter just in the cavernous segment, approximately 6 mg of superselective  intra-arterial Integrelin was then infused over approximately three minutes. A control arteriogram performed through the microcatheter demonstrated no change in the occluded right internal carotid artery supraclinoid segment. This time, using a torque device, a Synchro soft micro guidewire was gently manipulated without difficulty through the occluded right internal carotid artery supraclinoid segment and advanced into the M2 region of the inferior division of the right middle cerebral artery. This was then followed by the microcatheter. The micro guidewire was removed. Good aspiration was obtained from the hub of the microcatheter. The microcatheter in turn was then exchanged for a 0.014 inch Softip 300 cm exchange Transend EX micro guidewire using biplane roadmap technique and constant fluoroscopic guidance. The microcatheter was retrieved and removed. The distal end of the exchange micro guidewire which was not in the M2 region of the superior division of the right middle cerebral artery had a J-tip configuration to avoid dissections or inducing spasm, a control arteriogram performed through the [facility] shuttle sheath in the right common carotid artery demonstrated now antegrade flow through the previously occluded supraclinoid right ICA. Flow was noted into the MCA branches. A tight narrowing was now at the supraclinoid segment. At this time, a 2.5 x 15 mm Gateway angioplasty balloon microcatheter which had been purged with 75% contrast and 25% heparinized saline infusion was then advanced in a coaxial manner and with constant heparinized saline infusion and positioned optimally across the tight narrowing of the supraclinoid right ICA. Once confirmed, this was then gradually expanded using a micro inflation syringe device via micro tubing to approximately 5.2 atmospheres. Once there, it was maintained for approximately 1 minute 20 seconds. The balloon was then deflated and removed using constant fluoroscopic  guidance with roadmap technique to ensure safe positioning of the tip of the exchange micro guidewire in the M2 right MCA inferior branch. A control arteriogram performed through the [place] shuttle sheath demonstrated significantly improved caliber and flow through the angioplastied segment. Waste remained. This prompted the placement of a Wingspan 4 x 20 mm stent device. Measurements had been performed of the supraclinoid right ICA, proximally and distally. A 4 x 20 mm Wingspan stent system was prepped and purged with heparinized saline infusion proximally through the Tuohy-Borst and its delivery microcatheter hub. This was then locked into position. There on after using biplane roadmap technique and constant fluoroscopic guidance, in a coaxial manner and with constant heparinized saline fusion, the Wingspan stent delivery microcatheter and the stent delivery micro guidewire were then advanced without difficulty and positioned  such that the distal and the proximal markers on the stent optimally placed across the site of the angioplastied segment. Once confirmed, the O rings on the delivery micro guidewire of the stent and the delivery microcatheter were then loosened. With slight forward gentle traction with the right hand on the stent stabilizing device, which had been advanced to above the proximal portion of the stent, with the left hand, the delivery microcatheter was then gently retrieved unsheathing the distal and proximal portion of the stent. Once delivered, the delivery system was retrieved and removed. The distal micro guidewire was maintained in its position. A control arteriogram performed through the 6 French Cook shuttle sheath in the right common carotid artery demonstrated excellent flow through the stented supraclinoid right ICA with now opacification of the right middle and the right anterior cerebral artery. No filling defects were seen. Attention was now given to the right internal  carotid artery proximally at the site of the complex ulcerative plaque associated with significant stenosis. The exchange Transend 014 inch micro guidewire was then retrieved such that the distal portion of the wire was now in the horizontal petrous segment of the right internal carotid artery. Measurements were then performed of the right internal carotid artery and right common carotid artery across the bulb with the ulcerative plaque. It was decided to place a 8/6 mm x 40 mm Xact stent. This was then prepped and purged with heparinized saline infusion retrogradely. Using the rapid exchange system, the stent delivery system was then advanced without difficulty using biplane roadmap technique and constant fluoroscopic guidance in a coaxial manner and with constant heparinized saline infusion and positioned such that distal and proximal markers on the stent were optimally positioned. Once confirmed, the O-ring on the [place] shuttle sheath was slightly loosened. Under constant observation, the stent was then delivered without difficulty. With the distal wire held in safe constant position, the delivery microcatheter of the stent was then retrieved and removed. A control arteriogram was then performed through the [employer] shuttle sheath which demonstrated excellent apposition and caliber of the stented segment of the right internal carotid artery. Given the wide expansion with minimal to no waste it was decided against post stent placement angioplasty. A control arteriogram was then performed at 15, 30 and 40 minutes post placement of the stent proximally. These continue to demonstrate excellent flow through the stented segment proximally and also intracranially in the supraclinoid region. No evidence of intraluminal filling defects or occlusions or spasm was noted. The wire was then retrieved and removed under constant observation ensuring no sudden movements of the stent proximally. None was observed.  During the procedure the patient was given an additional 2 mg of intra-arterial Integrelin through the guide catheter in the right common carotid artery. Also patient was given a total of approximately 3000 units of intravenous heparin. The ACT was maintained in the region of approximately 180 seconds throughout the procedure. The patient exhibited no neurological or hemodynamic changes throughout the procedure. The 6 [place] Cook shuttle sheath was then retrieved into the abdominal aorta and removed. The 8 French Pinnacle groin sheath was then retrieved and removed with the placement of an external closure device. The patient's general anesthesia was reversed and the patient was extubated without difficulty. Upon recovery, the patient exhibited no evidence of headaches, nausea, vomiting or any changes in his left-sided weakness compared to prior to the procedure. He was then transferred to the neuro ICU to continue on IV  heparin, and to be carefully monitored neurologically and especially his blood pressures to be maintained within the parameters. Toward the latter end of the day, the patient was fully awake, alert, with no new neurological deficits. He continue to have left-sided weakness unchanged although he felt it might be a little bit better. He also complained of sore throat, on account of his intubation. The following morning, the patient's IV heparin was stopped as was his IV Cardene. He was started on advancement of p.o. intake. His right groin appeared soft. Patient's cervical collar was discontinued. His p.o. intake was to be increased and patient was to be mobilized as per the directions of the stroke team. He was started on aspirin 325 mg a day, and Plavix 75 mg a day. He was strongly advised to stop smoking and to return for follow-up in the clinic once discharged. Patient was also asked to maintain adequate hydration. IMPRESSION: Status post endovascular complete revascularization of occluded right  internal carotid artery supraclinoid segment with stent assisted angioplasty with placement of a Wingspan 4 x 20 mm stent, and proximally of the right internal carotid artery symptomatic stenoses with complex ulcerative plaque, by placement of a 8/6 mm x 40 mm Xact stent as described above. Electronically Signed   By: Luanne Bras M.D.   On: 02/12/2015 12:00   Ir Endovasc Intracranial Inf Other [place] Diag Angio  02/13/2015  CLINICAL DATA:  Worsening left-sided weakness, with dysarthria and left facial droop. 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 Patient is symptomatic despite aggressive medical anti-platelet therapy. EXAM: IR ANGIO VERTEBRAL SEL SUBCLAVIAN INNOMINATE UNI RIGHT MOD SED; INTRAVSC STENT CERV CAROTID W/O EMB-PROT; IR ENDOVASCULAR INTRACRANIAL INFUSION OTHER THAN THROMBOLYSIS ARTERIAL INCLUDE DIAG ANGIO; INTRACRANIAL STENT (INCL PTA); IR ANGIO INTRA EXTRACRAN SEL COM CAROTID INNOMINATE UNI LEFT MOD SED BILATERAL COMMON CAROTID ARTERIOGRAMS FOLLOWED BY ENDOVASCULAR COMPLETE REVASCULARIZATION OF OCCLUDED RIGHT INTERNAL CAROTID ARTERY SUPRACLINOID SEGMENT, WITH STENT-ASSISTED ANGIOPLASTY, FOLLOWED BY PLACEMENT OF A RIGHT INTERNAL CAROTID ARTERY PROXIMAL STENT BECAUSE OF A SEVERE STENOSES ASSOCIATED WITH LARGE ULCERATIVE PLAQUE. RIGHT VERTEBRAL ARTERY ANGIOGRAM: ANESTHESIA/SEDATION: General anesthesia. MEDICATIONS: As per general anesthesia. CONTRAST:  70 mL OMNIPAQUE IOHEXOL 300 MG/ML  SOLN PROCEDURE: Following a full explanation of the procedure along with the potential associated complications, an informed witnessed consent was obtained. The patient was put under general anesthesia by the Department of Anesthesiology at [facility]. The right groin was prepped and draped in the usual sterile fashion. Thereafter using modified Seldinger technique, transfemoral access into the right common femoral artery was obtained using  a micropuncture set. Over a 0.035 inch guidewire, a 5 French Pinnacle sheath was inserted. Through this, and also over a 0.035 inch guidewire, a 5 French JB1 catheter was advanced to the aortic arch region and selectively positioned in the left common carotid artery, the right subclavian artery and the right common carotid artery. Arteriograms were then performed intracranially and extracranially. The patient tolerated the procedure well. COMPLICATIONS: None immediate. FINDINGS: The left common carotid arteriogram demonstrates nonvisualization of the left external carotid artery or its branches. The left internal carotid artery at the bulb demonstrated a smooth plaque with minimal ulceration. No associated narrowing by the NASCET criteria was seen. The vessel was otherwise seen to opacify to the cranial skull base. The petrous segment appeared widely patent. The cavernous segment proximally demonstrated moderate stenoses associated with a small pseudoaneurysm. The distal cavernous segment and the supraclinoid segments were seen to  be widely patent. Left middle and the left anterior cerebral arteries opacified normally into the capillary and the venous phases. There was noted prompt opacification of the right anterior cerebral artery A2 segment, the A1 segment, and also partially the right middle cerebral artery distribution via the anterior communicating artery from the left internal carotid artery injection. The right subclavian arteriogram demonstrated the origin of the right vertebral artery to be normal. The vessel was seen to opacify normally to the cranial skull base to opacify in the right vertebrobasilar junction, the proximal basilar artery and the right posterior-inferior cerebellar artery. Suggestion of retrograde partial opacification of the right internal carotid artery supraclinoid segment from the right posterior communicating artery was noted. The right common carotid arteriogram demonstrates a  complex ulcerative plaque at the right internal carotid artery bulb and distally associated with a significantly narrowed lumen of the right internal carotid artery proximally just at the level of the bulb and distally. More distally, slow ascent of contrast was seen associated with focal areas of narrowing involving the petrous-cavernous segment. Complete angiographic occlusion of the right internal carotid artery at the level of the ophthalmic artery was noted. The delayed arterial phase demonstrated no evidence of distal reconstitution. ENDOVASCULAR COMPLETE REVASCULARIZATION OF THE OCCLUDED RIGHT INTERNAL CAROTID ARTERY SUPRACLINOID SEGMENT WITH STENT ASSISTED ANGIOPLASTY, AND THE RIGHT INTERNAL CAROTID ARTERY PROXIMALLY WITH PLACEMENT OF A STENT: The diagnostic JB1 catheter in the right common carotid artery was then exchanged over a 0.035 inch 300 cm Rosen exchange guidewire for a 6 [place] 85 cm Cook shuttle sheath using biplane roadmap technique and constant fluoroscopic guidance. The [facility] shuttle sheath had to be positioned through an exchange 8 [place] Pinnacle sheath because of previous scarring related to the surgery. Good aspiration was obtained from the side port of the Tuohy-Borst at the hub of the [place] shuttle sheath which was positioned just proximal to the right common carotid bifurcation. A gentle contrast injection demonstrated no evidence of spasms, dissections or of intraluminal filling defects. At this time in a coaxial manner and with constant heparinized saline infusion using biplane roadmap technique and constant fluoroscopic guidance, rapid transit two marker microcatheter was advanced over a 0.014 inch Softip Synchro micro guidewire to the petrous portion of the right internal carotid artery. The micro guidewire was then removed. Through the microcatheter just in the cavernous segment, approximately 6 mg of superselective intra-arterial Integrelin was then infused over approximately three  minutes. A control arteriogram performed through the microcatheter demonstrated no change in the occluded right internal carotid artery supraclinoid segment. This time, using a torque device, a Synchro soft micro guidewire was gently manipulated without difficulty through the occluded right internal carotid artery supraclinoid segment and advanced into the M2 region of the inferior division of the right middle cerebral artery. This was then followed by the microcatheter. The micro guidewire was removed. Good aspiration was obtained from the hub of the microcatheter. The microcatheter in turn was then exchanged for a 0.014 inch Softip 300 cm exchange Transend EX micro guidewire using biplane roadmap technique and constant fluoroscopic guidance. The microcatheter was retrieved and removed. The distal end of the exchange micro guidewire which was not in the M2 region of the superior division of the right middle cerebral artery had a J-tip configuration to avoid dissections or inducing spasm, a control arteriogram performed through the [facility] shuttle sheath in the right common carotid artery demonstrated now antegrade flow through the previously occluded supraclinoid right ICA. Flow was noted into  the MCA branches. A tight narrowing was now at the supraclinoid segment. At this time, a 2.5 x 15 mm [place] angioplasty balloon microcatheter which had been purged with 75% contrast and 25% heparinized saline infusion was then advanced in a coaxial manner and with constant heparinized saline infusion and positioned optimally across the tight narrowing of the supraclinoid right ICA. Once confirmed, this was then gradually expanded using a micro inflation syringe device via micro tubing to approximately 5.2 atmospheres. Once there, it was maintained for approximately 1 minute 20 seconds. The balloon was then deflated and removed using constant fluoroscopic guidance with roadmap technique to ensure safe positioning of the tip of  the exchange micro guidewire in the M2 right MCA inferior branch. A control arteriogram performed through the [place] shuttle sheath demonstrated significantly improved caliber and flow through the angioplastied segment. Waste remained. This prompted the placement of a Wingspan 4 x 20 mm stent device. Measurements had been performed of the supraclinoid right ICA, proximally and distally. A 4 x 20 mm Wingspan stent system was prepped and purged with heparinized saline infusion proximally through the Tuohy-Borst and its delivery microcatheter hub. This was then locked into position. There on after using biplane roadmap technique and constant fluoroscopic guidance, in a coaxial manner and with constant heparinized saline fusion, the Wingspan stent delivery microcatheter and the stent delivery micro guidewire were then advanced without difficulty and positioned such that the distal and the proximal markers on the stent optimally placed across the site of the angioplastied segment. Once confirmed, the O rings on the delivery micro guidewire of the stent and the delivery microcatheter were then loosened. With slight forward gentle traction with the right hand on the stent stabilizing device, which had been advanced to above the proximal portion of the stent, with the left hand, the delivery microcatheter was then gently retrieved unsheathing the distal and proximal portion of the stent. Once delivered, the delivery system was retrieved and removed. The distal micro guidewire was maintained in its position. A control arteriogram performed through the 6 French Cook shuttle sheath in the right common carotid artery demonstrated excellent flow through the stented supraclinoid right ICA with now opacification of the right middle and the right anterior cerebral artery. No filling defects were seen. Attention was now given to the right internal carotid artery proximally at the site of the complex ulcerative plaque  associated with significant stenosis. The exchange Transend 014 inch micro guidewire was then retrieved such that the distal portion of the wire was now in the horizontal petrous segment of the right internal carotid artery. Measurements were then performed of the right internal carotid artery and right common carotid artery across the bulb with the ulcerative plaque. It was decided to place a 8/6 mm x 40 mm Xact stent. This was then prepped and purged with heparinized saline infusion retrogradely. Using the rapid exchange system, the stent delivery system was then advanced without difficulty using biplane roadmap technique and constant fluoroscopic guidance in a coaxial manner and with constant heparinized saline infusion and positioned such that distal and proximal markers on the stent were optimally positioned. Once confirmed, the O-ring on the [place] shuttle sheath was slightly loosened. Under constant observation, the stent was then delivered without difficulty. With the distal wire held in safe constant position, the delivery microcatheter of the stent was then retrieved and removed. A control arteriogram was then performed through the [place] shuttle sheath which demonstrated excellent apposition  and caliber of the stented segment of the right internal carotid artery. Given the wide expansion with minimal to no waste it was decided against post stent placement angioplasty. A control arteriogram was then performed at 15, 30 and 40 minutes post placement of the stent proximally. These continue to demonstrate excellent flow through the stented segment proximally and also intracranially in the supraclinoid region. No evidence of intraluminal filling defects or occlusions or spasm was noted. The wire was then retrieved and removed under constant observation ensuring no sudden movements of the stent proximally. None was observed. During the procedure the patient was given an additional 2 mg of  intra-arterial Integrelin through the guide catheter in the right common carotid artery. Also patient was given a total of approximately 3000 units of intravenous heparin. The ACT was maintained in the region of approximately 180 seconds throughout the procedure. The patient exhibited no neurological or hemodynamic changes throughout the procedure. The 6 [place] Cook shuttle sheath was then retrieved into the abdominal aorta and removed. The 8 French Pinnacle groin sheath was then retrieved and removed with the placement of an external closure device. The patient's general anesthesia was reversed and the patient was extubated without difficulty. Upon recovery, the patient exhibited no evidence of headaches, nausea, vomiting or any changes in his left-sided weakness compared to prior to the procedure. He was then transferred to the neuro ICU to continue on IV heparin, and to be carefully monitored neurologically and especially his blood pressures to be maintained within the parameters. Toward the latter end of the day, the patient was fully awake, alert, with no new neurological deficits. He continue to have left-sided weakness unchanged although he felt it might be a little bit better. He also complained of sore throat, on account of his intubation. The following morning, the patient's IV heparin was stopped as was his IV Cardene. He was started on advancement of p.o. intake. His right groin appeared soft. Patient's cervical collar was discontinued. His p.o. intake was to be increased and patient was to be mobilized as per the directions of the stroke team. He was started on aspirin 325 mg a day, and Plavix 75 mg a day. He was strongly advised to stop smoking and to return for follow-up in the clinic once discharged. Patient was also asked to maintain adequate hydration. IMPRESSION: Status post endovascular complete revascularization of occluded right internal carotid artery supraclinoid segment with stent assisted  angioplasty with placement of a Wingspan 4 x 20 mm stent, and proximally of the right internal carotid artery symptomatic stenoses with complex ulcerative plaque, by placement of a 8/6 mm x 40 mm Xact stent as described above. Electronically Signed   By: Luanne Bras M.D.   On: 02/12/2015 12:00   Ir Angio Intra Extracran Sel Com Carotid Innominate Uni L Mod Sed  02/13/2015  CLINICAL DATA:  Worsening left-sided weakness, with dysarthria and left facial droop. 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 Patient is symptomatic despite aggressive medical anti-platelet therapy. EXAM: IR ANGIO VERTEBRAL SEL SUBCLAVIAN INNOMINATE UNI RIGHT MOD SED; INTRAVSC STENT CERV CAROTID W/O EMB-PROT; IR ENDOVASCULAR INTRACRANIAL INFUSION OTHER THAN THROMBOLYSIS ARTERIAL INCLUDE DIAG ANGIO; INTRACRANIAL STENT (INCL PTA); IR ANGIO INTRA EXTRACRAN SEL COM CAROTID INNOMINATE UNI LEFT MOD SED BILATERAL COMMON CAROTID ARTERIOGRAMS FOLLOWED BY ENDOVASCULAR COMPLETE REVASCULARIZATION OF OCCLUDED RIGHT INTERNAL CAROTID ARTERY SUPRACLINOID SEGMENT, WITH STENT-ASSISTED ANGIOPLASTY, FOLLOWED BY PLACEMENT OF A RIGHT INTERNAL CAROTID ARTERY PROXIMAL STENT BECAUSE OF A SEVERE STENOSES  ASSOCIATED WITH LARGE ULCERATIVE PLAQUE. RIGHT VERTEBRAL ARTERY ANGIOGRAM: ANESTHESIA/SEDATION: General anesthesia. MEDICATIONS: As per general anesthesia. CONTRAST:  70 mL OMNIPAQUE IOHEXOL 300 MG/ML  SOLN PROCEDURE: Following a full explanation of the procedure along with the potential associated complications, an informed witnessed consent was obtained. The patient was put under general anesthesia by the Department of Anesthesiology at [facility]. The right groin was prepped and draped in the usual sterile fashion. Thereafter using modified Seldinger technique, transfemoral access into the right common femoral artery was obtained using a micropuncture set. Over a 0.035 inch guidewire, a 5 French  Pinnacle sheath was inserted. Through this, and also over a 0.035 inch guidewire, a 5 French JB1 catheter was advanced to the aortic arch region and selectively positioned in the left common carotid artery, the right subclavian artery and the right common carotid artery. Arteriograms were then performed intracranially and extracranially. The patient tolerated the procedure well. COMPLICATIONS: None immediate. FINDINGS: The left common carotid arteriogram demonstrates nonvisualization of the left external carotid artery or its branches. The left internal carotid artery at the bulb demonstrated a smooth plaque with minimal ulceration. No associated narrowing by the NASCET criteria was seen. The vessel was otherwise seen to opacify to the cranial skull base. The petrous segment appeared widely patent. The cavernous segment proximally demonstrated moderate stenoses associated with a small pseudoaneurysm. The distal cavernous segment and the supraclinoid segments were seen to be widely patent. Left middle and the left anterior cerebral arteries opacified normally into the capillary and the venous phases. There was noted prompt opacification of the right anterior cerebral artery A2 segment, the A1 segment, and also partially the right middle cerebral artery distribution via the anterior communicating artery from the left internal carotid artery injection. The right subclavian arteriogram demonstrated the origin of the right vertebral artery to be normal. The vessel was seen to opacify normally to the cranial skull base to opacify in the right vertebrobasilar junction, the proximal basilar artery and the right posterior-inferior cerebellar artery. Suggestion of retrograde partial opacification of the right internal carotid artery supraclinoid segment from the right posterior communicating artery was noted. The right common carotid arteriogram demonstrates a complex ulcerative plaque at the right internal carotid artery  bulb and distally associated with a significantly narrowed lumen of the right internal carotid artery proximally just at the level of the bulb and distally. More distally, slow ascent of contrast was seen associated with focal areas of narrowing involving the petrous-cavernous segment. Complete angiographic occlusion of the right internal carotid artery at the level of the ophthalmic artery was noted. The delayed arterial phase demonstrated no evidence of distal reconstitution. ENDOVASCULAR COMPLETE REVASCULARIZATION OF THE OCCLUDED RIGHT INTERNAL CAROTID ARTERY SUPRACLINOID SEGMENT WITH STENT ASSISTED ANGIOPLASTY, AND THE RIGHT INTERNAL CAROTID ARTERY PROXIMALLY WITH PLACEMENT OF A STENT: The diagnostic JB1 catheter in the right common carotid artery was then exchanged over a 0.035 inch 300 cm Rosen exchange guidewire for a 6 [place] 85 cm Cook shuttle sheath using biplane roadmap technique and constant fluoroscopic guidance. The [facility] shuttle sheath had to be positioned through an exchange 8 [place] Pinnacle sheath because of previous scarring related to the surgery. Good aspiration was obtained from the side port of the Tuohy-Borst at the hub of the [place] shuttle sheath which was positioned just proximal to the right common carotid bifurcation. A gentle contrast injection demonstrated no evidence of spasms, dissections or of intraluminal filling defects. At this time in a coaxial manner and with constant  heparinized saline infusion using biplane roadmap technique and constant fluoroscopic guidance, rapid transit two marker microcatheter was advanced over a 0.014 inch Softip Synchro micro guidewire to the petrous portion of the right internal carotid artery. The micro guidewire was then removed. Through the microcatheter just in the cavernous segment, approximately 6 mg of superselective intra-arterial Integrelin was then infused over approximately three minutes. A control arteriogram performed through the  microcatheter demonstrated no change in the occluded right internal carotid artery supraclinoid segment. This time, using a torque device, a Synchro soft micro guidewire was gently manipulated without difficulty through the occluded right internal carotid artery supraclinoid segment and advanced into the M2 region of the inferior division of the right middle cerebral artery. This was then followed by the microcatheter. The micro guidewire was removed. Good aspiration was obtained from the hub of the microcatheter. The microcatheter in turn was then exchanged for a 0.014 inch Softip 300 cm exchange Transend EX micro guidewire using biplane roadmap technique and constant fluoroscopic guidance. The microcatheter was retrieved and removed. The distal end of the exchange micro guidewire which was not in the M2 region of the superior division of the right middle cerebral artery had a J-tip configuration to avoid dissections or inducing spasm, a control arteriogram performed through the [facility] shuttle sheath in the right common carotid artery demonstrated now antegrade flow through the previously occluded supraclinoid right ICA. Flow was noted into the MCA branches. A tight narrowing was now at the supraclinoid segment. At this time, a 2.5 x 15 mm Gateway angioplasty balloon microcatheter which had been purged with 75% contrast and 25% heparinized saline infusion was then advanced in a coaxial manner and with constant heparinized saline infusion and positioned optimally across the tight narrowing of the supraclinoid right ICA. Once confirmed, this was then gradually expanded using a micro inflation syringe device via micro tubing to approximately 5.2 atmospheres. Once there, it was maintained for approximately 1 minute 20 seconds. The balloon was then deflated and removed using constant fluoroscopic guidance with roadmap technique to ensure safe positioning of the tip of the exchange micro guidewire in the M2 right MCA  inferior branch. A control arteriogram performed through the [place] shuttle sheath demonstrated significantly improved caliber and flow through the angioplastied segment. Waste remained. This prompted the placement of a Wingspan 4 x 20 mm stent device. Measurements had been performed of the supraclinoid right ICA, proximally and distally. A 4 x 20 mm Wingspan stent system was prepped and purged with heparinized saline infusion proximally through the Tuohy-Borst and its delivery microcatheter hub. This was then locked into position. There on after using biplane roadmap technique and constant fluoroscopic guidance, in a coaxial manner and with constant heparinized saline fusion, the Wingspan stent delivery microcatheter and the stent delivery micro guidewire were then advanced without difficulty and positioned such that the distal and the proximal markers on the stent optimally placed across the site of the angioplastied segment. Once confirmed, the O rings on the delivery micro guidewire of the stent and the delivery microcatheter were then loosened. With slight forward gentle traction with the right hand on the stent stabilizing device, which had been advanced to above the proximal portion of the stent, with the left hand, the delivery microcatheter was then gently retrieved unsheathing the distal and proximal portion of the stent. Once delivered, the delivery system was retrieved and removed. The distal micro guidewire was maintained in its position. A control arteriogram performed through the 6  [place] shuttle sheath in the right common carotid artery demonstrated excellent flow through the stented supraclinoid right ICA with now opacification of the right middle and the right anterior cerebral artery. No filling defects were seen. Attention was now given to the right internal carotid artery proximally at the site of the complex ulcerative plaque associated with significant stenosis. The exchange  Transend 014 inch micro guidewire was then retrieved such that the distal portion of the wire was now in the horizontal petrous segment of the right internal carotid artery. Measurements were then performed of the right internal carotid artery and right common carotid artery across the bulb with the ulcerative plaque. It was decided to place a 8/6 mm x 40 mm Xact stent. This was then prepped and purged with heparinized saline infusion retrogradely. Using the rapid exchange system, the stent delivery system was then advanced without difficulty using biplane roadmap technique and constant fluoroscopic guidance in a coaxial manner and with constant heparinized saline infusion and positioned such that distal and proximal markers on the stent were optimally positioned. Once confirmed, the O-ring on the [place] shuttle sheath was slightly loosened. Under constant observation, the stent was then delivered without difficulty. With the distal wire held in safe constant position, the delivery microcatheter of the stent was then retrieved and removed. A control arteriogram was then performed through the [place] shuttle sheath which demonstrated excellent apposition and caliber of the stented segment of the right internal carotid artery. Given the wide expansion with minimal to no waste it was decided against post stent placement angioplasty. A control arteriogram was then performed at 15, 30 and 40 minutes post placement of the stent proximally. These continue to demonstrate excellent flow through the stented segment proximally and also intracranially in the supraclinoid region. No evidence of intraluminal filling defects or occlusions or spasm was noted. The wire was then retrieved and removed under constant observation ensuring no sudden movements of the stent proximally. None was observed. During the procedure the patient was given an additional 2 mg of intra-arterial Integrelin through the guide catheter in the  right common carotid artery. Also patient was given a total of approximately 3000 units of intravenous heparin. The ACT was maintained in the region of approximately 180 seconds throughout the procedure. The patient exhibited no neurological or hemodynamic changes throughout the procedure. The 6 [place] Cook shuttle sheath was then retrieved into the abdominal aorta and removed. The 8 French Pinnacle groin sheath was then retrieved and removed with the placement of an external closure device. The patient's general anesthesia was reversed and the patient was extubated without difficulty. Upon recovery, the patient exhibited no evidence of headaches, nausea, vomiting or any changes in his left-sided weakness compared to prior to the procedure. He was then transferred to the neuro ICU to continue on IV heparin, and to be carefully monitored neurologically and especially his blood pressures to be maintained within the parameters. Toward the latter end of the day, the patient was fully awake, alert, with no new neurological deficits. He continue to have left-sided weakness unchanged although he felt it might be a little bit better. He also complained of sore throat, on account of his intubation. The following morning, the patient's IV heparin was stopped as was his IV Cardene. He was started on advancement of p.o. intake. His right groin appeared soft. Patient's cervical collar was discontinued. His p.o. intake was to be increased and patient was to be mobilized as per  the directions of the stroke team. He was started on aspirin 325 mg a day, and Plavix 75 mg a day. He was strongly advised to stop smoking and to return for follow-up in the clinic once discharged. Patient was also asked to maintain adequate hydration. IMPRESSION: Status post endovascular complete revascularization of occluded right internal carotid artery supraclinoid segment with stent assisted angioplasty with placement of a Wingspan 4 x 20 mm stent, and  proximally of the right internal carotid artery symptomatic stenoses with complex ulcerative plaque, by placement of a 8/6 mm x 40 mm Xact stent as described above. Electronically Signed   By: Luanne Bras M.D.   On: 02/12/2015 12:00   Ir Angio Vertebral Sel Subclavian Innominate Uni R Mod Sed  02/13/2015  CLINICAL DATA:  Worsening left-sided weakness, with dysarthria and left facial droop. 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 Patient is symptomatic despite aggressive medical anti-platelet therapy. EXAM: IR ANGIO VERTEBRAL SEL SUBCLAVIAN INNOMINATE UNI RIGHT MOD SED; INTRAVSC STENT CERV CAROTID W/O EMB-PROT; IR ENDOVASCULAR INTRACRANIAL INFUSION OTHER THAN THROMBOLYSIS ARTERIAL INCLUDE DIAG ANGIO; INTRACRANIAL STENT (INCL PTA); IR ANGIO INTRA EXTRACRAN SEL COM CAROTID INNOMINATE UNI LEFT MOD SED BILATERAL COMMON CAROTID ARTERIOGRAMS FOLLOWED BY ENDOVASCULAR COMPLETE REVASCULARIZATION OF OCCLUDED RIGHT INTERNAL CAROTID ARTERY SUPRACLINOID SEGMENT, WITH STENT-ASSISTED ANGIOPLASTY, FOLLOWED BY PLACEMENT OF A RIGHT INTERNAL CAROTID ARTERY PROXIMAL STENT BECAUSE OF A SEVERE STENOSES ASSOCIATED WITH LARGE ULCERATIVE PLAQUE. RIGHT VERTEBRAL ARTERY ANGIOGRAM: ANESTHESIA/SEDATION: General anesthesia. MEDICATIONS: As per general anesthesia. CONTRAST:  70 mL OMNIPAQUE IOHEXOL 300 MG/ML  SOLN PROCEDURE: Following a full explanation of the procedure along with the potential associated complications, an informed witnessed consent was obtained. The patient was put under general anesthesia by the Department of Anesthesiology at [facility]. The right groin was prepped and draped in the usual sterile fashion. Thereafter using modified Seldinger technique, transfemoral access into the right common femoral artery was obtained using a micropuncture set. Over a 0.035 inch guidewire, a 5 French Pinnacle sheath was inserted. Through this, and also over a 0.035 inch  guidewire, a 5 French JB1 catheter was advanced to the aortic arch region and selectively positioned in the left common carotid artery, the right subclavian artery and the right common carotid artery. Arteriograms were then performed intracranially and extracranially. The patient tolerated the procedure well. COMPLICATIONS: None immediate. FINDINGS: The left common carotid arteriogram demonstrates nonvisualization of the left external carotid artery or its branches. The left internal carotid artery at the bulb demonstrated a smooth plaque with minimal ulceration. No associated narrowing by the NASCET criteria was seen. The vessel was otherwise seen to opacify to the cranial skull base. The petrous segment appeared widely patent. The cavernous segment proximally demonstrated moderate stenoses associated with a small pseudoaneurysm. The distal cavernous segment and the supraclinoid segments were seen to be widely patent. Left middle and the left anterior cerebral arteries opacified normally into the capillary and the venous phases. There was noted prompt opacification of the right anterior cerebral artery A2 segment, the A1 segment, and also partially the right middle cerebral artery distribution via the anterior communicating artery from the left internal carotid artery injection. The right subclavian arteriogram demonstrated the origin of the right vertebral artery to be normal. The vessel was seen to opacify normally to the cranial skull base to opacify in the right vertebrobasilar junction, the proximal basilar artery and the right posterior-inferior cerebellar artery. Suggestion of retrograde partial opacification of the right internal carotid artery  supraclinoid segment from the right posterior communicating artery was noted. The right common carotid arteriogram demonstrates a complex ulcerative plaque at the right internal carotid artery bulb and distally associated with a significantly narrowed lumen of the  right internal carotid artery proximally just at the level of the bulb and distally. More distally, slow ascent of contrast was seen associated with focal areas of narrowing involving the petrous-cavernous segment. Complete angiographic occlusion of the right internal carotid artery at the level of the ophthalmic artery was noted. The delayed arterial phase demonstrated no evidence of distal reconstitution. ENDOVASCULAR COMPLETE REVASCULARIZATION OF THE OCCLUDED RIGHT INTERNAL CAROTID ARTERY SUPRACLINOID SEGMENT WITH STENT ASSISTED ANGIOPLASTY, AND THE RIGHT INTERNAL CAROTID ARTERY PROXIMALLY WITH PLACEMENT OF A STENT: The diagnostic JB1 catheter in the right common carotid artery was then exchanged over a 0.035 inch 300 cm Rosen exchange guidewire for a 6 [place] 85 cm Cook shuttle sheath using biplane roadmap technique and constant fluoroscopic guidance. The [facility] shuttle sheath had to be positioned through an exchange 8 [place] Pinnacle sheath because of previous scarring related to the surgery. Good aspiration was obtained from the side port of the Tuohy-Borst at the hub of the [place] shuttle sheath which was positioned just proximal to the right common carotid bifurcation. A gentle contrast injection demonstrated no evidence of spasms, dissections or of intraluminal filling defects. At this time in a coaxial manner and with constant heparinized saline infusion using biplane roadmap technique and constant fluoroscopic guidance, rapid transit two marker microcatheter was advanced over a 0.014 inch Softip Synchro micro guidewire to the petrous portion of the right internal carotid artery. The micro guidewire was then removed. Through the microcatheter just in the cavernous segment, approximately 6 mg of superselective intra-arterial Integrelin was then infused over approximately three minutes. A control arteriogram performed through the microcatheter demonstrated no change in the occluded right internal carotid artery  supraclinoid segment. This time, using a torque device, a Synchro soft micro guidewire was gently manipulated without difficulty through the occluded right internal carotid artery supraclinoid segment and advanced into the M2 region of the inferior division of the right middle cerebral artery. This was then followed by the microcatheter. The micro guidewire was removed. Good aspiration was obtained from the hub of the microcatheter. The microcatheter in turn was then exchanged for a 0.014 inch Softip 300 cm exchange Transend EX micro guidewire using biplane roadmap technique and constant fluoroscopic guidance. The microcatheter was retrieved and removed. The distal end of the exchange micro guidewire which was not in the M2 region of the superior division of the right middle cerebral artery had a J-tip configuration to avoid dissections or inducing spasm, a control arteriogram performed through the [facility] shuttle sheath in the right common carotid artery demonstrated now antegrade flow through the previously occluded supraclinoid right ICA. Flow was noted into the MCA branches. A tight narrowing was now at the supraclinoid segment. At this time, a 2.5 x 15 mm Gateway angioplasty balloon microcatheter which had been purged with 75% contrast and 25% heparinized saline infusion was then advanced in a coaxial manner and with constant heparinized saline infusion and positioned optimally across the tight narrowing of the supraclinoid right ICA. Once confirmed, this was then gradually expanded using a micro inflation syringe device via micro tubing to approximately 5.2 atmospheres. Once there, it was maintained for approximately 1 minute 20 seconds. The balloon was then deflated and removed using constant fluoroscopic guidance with roadmap technique to ensure safe positioning of the  tip of the exchange micro guidewire in the M2 right MCA inferior branch. A control arteriogram performed through the [place] shuttle  sheath demonstrated significantly improved caliber and flow through the angioplastied segment. Waste remained. This prompted the placement of a Wingspan 4 x 20 mm stent device. Measurements had been performed of the supraclinoid right ICA, proximally and distally. A 4 x 20 mm Wingspan stent system was prepped and purged with heparinized saline infusion proximally through the Tuohy-Borst and its delivery microcatheter hub. This was then locked into position. There on after using biplane roadmap technique and constant fluoroscopic guidance, in a coaxial manner and with constant heparinized saline fusion, the Wingspan stent delivery microcatheter and the stent delivery micro guidewire were then advanced without difficulty and positioned such that the distal and the proximal markers on the stent optimally placed across the site of the angioplastied segment. Once confirmed, the O rings on the delivery micro guidewire of the stent and the delivery microcatheter were then loosened. With slight forward gentle traction with the right hand on the stent stabilizing device, which had been advanced to above the proximal portion of the stent, with the left hand, the delivery microcatheter was then gently retrieved unsheathing the distal and proximal portion of the stent. Once delivered, the delivery system was retrieved and removed. The distal micro guidewire was maintained in its position. A control arteriogram performed through the 6 [place] shuttle sheath in the right common carotid artery demonstrated excellent flow through the stented supraclinoid right ICA with now opacification of the right middle and the right anterior cerebral artery. No filling defects were seen. Attention was now given to the right internal carotid artery proximally at the site of the complex ulcerative plaque associated with significant stenosis. The exchange Transend 014 inch micro guidewire was then retrieved such that the distal portion of the  wire was now in the horizontal petrous segment of the right internal carotid artery. Measurements were then performed of the right internal carotid artery and right common carotid artery across the bulb with the ulcerative plaque. It was decided to place a 8/6 mm x 40 mm Xact stent. This was then prepped and purged with heparinized saline infusion retrogradely. Using the rapid exchange system, the stent delivery system was then advanced without difficulty using biplane roadmap technique and constant fluoroscopic guidance in a coaxial manner and with constant heparinized saline infusion and positioned such that distal and proximal markers on the stent were optimally positioned. Once confirmed, the O-ring on the [place] shuttle sheath was slightly loosened. Under constant observation, the stent was then delivered without difficulty. With the distal wire held in safe constant position, the delivery microcatheter of the stent was then retrieved and removed. A control arteriogram was then performed through the [place] shuttle sheath which demonstrated excellent apposition and caliber of the stented segment of the right internal carotid artery. Given the wide expansion with minimal to no waste it was decided against post stent placement angioplasty. A control arteriogram was then performed at 15, 30 and 40 minutes post placement of the stent proximally. These continue to demonstrate excellent flow through the stented segment proximally and also intracranially in the supraclinoid region. No evidence of intraluminal filling defects or occlusions or spasm was noted. The wire was then retrieved and removed under constant observation ensuring no sudden movements of the stent proximally. None was observed. During the procedure the patient was given an additional 2 mg of intra-arterial Integrelin  through the guide catheter in the right common carotid artery. Also patient was given a total of approximately 3000 units  of intravenous heparin. The ACT was maintained in the region of approximately 180 seconds throughout the procedure. The patient exhibited no neurological or hemodynamic changes throughout the procedure. The 6 [place] shuttle sheath was then retrieved into the abdominal aorta and removed. The 8 French Pinnacle groin sheath was then retrieved and removed with the placement of an external closure device. The patient's general anesthesia was reversed and the patient was extubated without difficulty. Upon recovery, the patient exhibited no evidence of headaches, nausea, vomiting or any changes in his left-sided weakness compared to prior to the procedure. He was then transferred to the neuro ICU to continue on IV heparin, and to be carefully monitored neurologically and especially his blood pressures to be maintained within the parameters. Toward the latter end of the day, the patient was fully awake, alert, with no new neurological deficits. He continue to have left-sided weakness unchanged although he felt it might be a little bit better. He also complained of sore throat, on account of his intubation. The following morning, the patient's IV heparin was stopped as was his IV Cardene. He was started on advancement of p.o. intake. His right groin appeared soft. Patient's cervical collar was discontinued. His p.o. intake was to be increased and patient was to be mobilized as per the directions of the stroke team. He was started on aspirin 325 mg a day, and Plavix 75 mg a day. He was strongly advised to stop smoking and to return for follow-up in the clinic once discharged. Patient was also asked to maintain adequate hydration. IMPRESSION: Status post endovascular complete revascularization of occluded right internal carotid artery supraclinoid segment with stent assisted angioplasty with placement of a Wingspan 4 x 20 mm stent, and proximally of the right internal carotid artery symptomatic stenoses with complex  ulcerative plaque, by placement of a 8/6 mm x 40 mm Xact stent as described above. Electronically Signed   By: Luanne Bras M.D.   On: 02/12/2015 12:00     Assessment/Plan   ICD-9-CM ICD-10-CM   1. Cerebrovascular accident (CVA) due to stenosis of right carotid artery [facility]) s/p cath guided stent to right ICA 433.11 I63.231   2. Idiopathic gout, unspecified chronicity, unspecified site 274.9 M10.00   3. PAF (paroxysmal atrial fibrillation) (HCC) 427.31 I48.0   4. Peripheral vascular disease (HCC) 443.9 I73.9   5. Hemiparesis affecting left side as late effect of cerebrovascular accident ([place]) 438.20 I69.354   6. Chronic obstructive pulmonary disease, unspecified COPD type ([place]) 496 J44.9   7. Essential hypertension 401.9 I10   8. Tobacco abuse 305.1 Z72.0   9. Chronic diastolic congestive heart failure (HCC) 428.32 I50.32    428.0      Check CBC w diff, BMP in 1 week  F/u with IR, neurology and vascular sx as scheduled  Cont current meds as ordered. Finish  prednisone taper  PT/OT/ST as ordered  GOAL: short term rehab and d/c home when medically appropriate. Communicated with pt and nursing.  Will follow  Deondrea Markos S. Perlie Gold  [facility] [address] [place], [place] [address] [phone] Cell (Monday-Friday 8 AM - 5 PM) [phone] After 5 PM and follow prompts

## 2015-02-15 ENCOUNTER — Telehealth: Payer: Self-pay

## 2015-02-15 ENCOUNTER — Non-Acute Institutional Stay (SKILLED_NURSING_FACILITY): Payer: Medicare Other | Admitting: Nurse Practitioner

## 2015-02-15 DIAGNOSIS — J42 Unspecified chronic bronchitis: Secondary | ICD-10-CM

## 2015-02-15 DIAGNOSIS — K219 Gastro-esophageal reflux disease without esophagitis: Secondary | ICD-10-CM

## 2015-02-15 DIAGNOSIS — I63231 Cerebral infarction due to unspecified occlusion or stenosis of right carotid arteries: Secondary | ICD-10-CM | POA: Diagnosis not present

## 2015-02-15 DIAGNOSIS — I10 Essential (primary) hypertension: Secondary | ICD-10-CM

## 2015-02-15 DIAGNOSIS — M10079 Idiopathic gout, unspecified ankle and foot: Secondary | ICD-10-CM | POA: Diagnosis not present

## 2015-02-15 DIAGNOSIS — I739 Peripheral vascular disease, unspecified: Secondary | ICD-10-CM

## 2015-02-15 DIAGNOSIS — E785 Hyperlipidemia, unspecified: Secondary | ICD-10-CM | POA: Diagnosis not present

## 2015-02-15 DIAGNOSIS — I2510 Atherosclerotic heart disease of native coronary artery without angina pectoris: Secondary | ICD-10-CM | POA: Diagnosis not present

## 2015-02-15 NOTE — Telephone Encounter (Signed)
IF patient calls back we need to cancel his appt for Monday January 9 at 1000 per Dr Erlinda Hong. Pt was just seen in the hospital by him in December 2017. I left vm. Please reschedule him for march thanks.

## 2015-02-15 NOTE — Telephone Encounter (Signed)
Pt has new appt for 04/17/2015 at 0830am.

## 2015-02-15 NOTE — Telephone Encounter (Signed)
Rn call [facility] for patient about rescheduling appt. Pt is a resident at the facility. Rn talk to [employer] who does the schedule. Rn explain patients appt for January 9,2017 will be cancel per Dr.Xu. Pt was just seen by Dr. Erlinda Hong in the hospital in December 2016. Pt was reschedule for April 17, 2015 at 0830 for hospital follow up. Debra verified appt with Rn.

## 2015-02-15 NOTE — Progress Notes (Signed)
Patient ID: [name], male   DOB: [birth year], 65 y.o.   MRN: [id number]    Nursing Home Location:  [place] of Service: SNF (31)  PCP: Arnoldo Morale, MD  Allergies  Allergen Reactions  . Darvocet [Propoxyphene N-Acetaminophen] Hives  . Haldol [Haloperidol Decanoate] Hives  . Acetaminophen Nausea Only    Upset stomach, tolerates Hydrocodone/APAP if taken with food Upset stomach, tolerates Hydrocodone/APAP if taken with food  . Norco [Hydrocodone-Acetaminophen] Nausea And Vomiting    Tolerates if taken with food    Chief Complaint  Patient presents with  . Discharge Note    HPI:  Patient is a 65 y.o. male seen today at [facility] for discharge home. Pt at [facility] after hospitalization for acute CVA.  Pt with hx of CVA with left hemiparesis, acute gouty arthrtits, COPD, PAD, PAF, OSA.  Pt was started on ASA/plavix during hospitalization. Also had an aute gout attack and Rx prednisone taper. Pt with ongoing complaints of foot pain but is currently on prednisone for gout and gabapentin. Patient currently doing well with therapy, now stable to discharge home with home health.  Review of Systems:  Review of Systems  Constitutional: Negative for activity change, appetite change, fatigue and unexpected weight change.  HENT: Negative for congestion and hearing loss.   Eyes: Negative.   Respiratory: Negative for cough and shortness of breath.   Cardiovascular: Negative for chest pain, palpitations and leg swelling.  Gastrointestinal: Negative for abdominal pain, diarrhea and constipation.  Genitourinary: Negative for dysuria and difficulty urinating.  Musculoskeletal: Positive for arthralgias and gait problem. Negative for myalgias.  Skin: Negative for color change and wound.  Neurological: Positive for weakness. Negative for dizziness.  Psychiatric/Behavioral: Negative for behavioral problems, confusion and agitation.  All other  systems reviewed and are negative.   Past Medical History  Diagnosis Date  . Depression   . Hypertension   . Hyperlipidemia   . Myocardial infarction ([place]) 2000  . CHF (congestive heart failure) ([place])   . COPD (chronic obstructive pulmonary disease) ([place])   . Peripheral vascular disease, unspecified ([place])     08/20/10 doppler: increase in right ABI post-op. Left ABI stable. S/P bi-fem bypass surgery  . GERD (gastroesophageal reflux disease)   . Asthma   . Anxiety   . History of DVT of lower extremity   . CAD (coronary artery disease) 05/27/10    Cath: severe single vessell CAD left cx midportion obtuse marginal 2 to 3.  . Shortness of breath   . Pneumonia 04/04/2012  . Arthritis   . OSA on CPAP   . Hyperlipemia   . COPD (chronic obstructive pulmonary disease) ([place])   . Alcohol abuse     H/O  . Tobacco abuse   . Orbital fracture ([place]) 12/2012  . Stroke [facility])    Past Surgical History  Procedure Laterality Date  . Femoral bypass  08/19/10    Right Fem-Pop  . Aortogram w/ ptca  09/292003  . Eye surgery    . Back surgery    . Total knee arthroplasty    . Cholecystectomy    . Cardiac catheterization  05/27/10    severe CAD left cx  . Abdoninal ao angio & bifem angio  05/27/10    Patent graft, occluded bil stents with no retrograde flow into the hypogastric arteries. 100% occl left ant. tibial artery, 70% to 80% to 100% stenosis right superficial fem artery above adductor canal. 100 %  occl right ant. tibial vessell  . Esophagogastric fundoplication    . Tee without cardioversion N/A 08/29/2014    Procedure: TRANSESOPHAGEAL ECHOCARDIOGRAM (TEE);  Surgeon: Josue Hector, MD;  Location: [facility] ENDOSCOPY;  Service: Cardiovascular;  Laterality: N/A;  . Peripheral vascular catheterization N/A 12/10/2014    Procedure: Abdominal Aortogram;  Surgeon: Angelia Mould, MD;  Location: [place] CV LAB;  Service: Cardiovascular;  Laterality: N/A;  . Radiology with anesthesia N/A  02/08/2015    Procedure: RADIOLOGY WITH ANESTHESIA;  Surgeon: Luanne Bras, MD;  Location: [place];  Service: Radiology;  Laterality: N/A;   Social History:   reports that he has been smoking Cigars and Cigarettes.  He started smoking about 54 years ago. He has a 50 pack-year smoking history. He has quit using smokeless tobacco. His smokeless tobacco use included Chew. He reports that he drinks about 0.6 oz of alcohol per week. He reports that he does not use illicit drugs.  Family History  Problem Relation Age of Onset  . Heart attack Mother   . Cirrhosis Father   . Heart failure Mother   . Heart failure Brother   . Cancer Brother   . Hypertension Neg Hx     UNKNOWN  . Stroke Neg Hx     UNKNOWN    Medications: Patient's Medications  New Prescriptions   No medications on file  Previous Medications   ALBUTEROL (PROVENTIL HFA;VENTOLIN HFA) 108 (90 BASE) MCG/ACT INHALER    Inhale 2 puffs into the lungs every 6 (six) hours as needed for wheezing or shortness of breath.   ALBUTEROL (PROVENTIL) (2.5 MG/3ML) 0.083% NEBULIZER SOLUTION    Take 3 mLs (2.5 mg total) by nebulization 2 (two) times daily as needed for wheezing or shortness of breath.   ASPIRIN 325 MG EC TABLET    Take 1 tablet (325 mg total) by mouth daily.   ATORVASTATIN (LIPITOR) 40 MG TABLET    Take 2 tablets (80 mg total) by mouth daily at 6 PM.   BESIFLOXACIN HCL (BESIVANCE) 0.6 % SUSP    Place 1 drop into the left eye 2 (two) times daily.   CARVEDILOL (COREG) 6.25 MG TABLET    Take 2 tablets (12.5 mg total) by mouth 2 (two) times daily with a meal.   CLOPIDOGREL (PLAVIX) 75 MG TABLET    Take 1 tablet (75 mg total) by mouth daily.   FUROSEMIDE (LASIX) 40 MG TABLET    Take 1 tablet (40 mg total) by mouth 2 (two) times daily.   GABAPENTIN (NEURONTIN) 300 MG CAPSULE    Take 1 capsule (300 mg total) by mouth 3 (three) times daily.   MOMETASONE-FORMOTEROL (DULERA) 200-5 MCG/ACT AERO    Inhale 2 puffs into the lungs 2 (two)  times daily.   PANTOPRAZOLE (PROTONIX) 40 MG TABLET    Take 1 tablet (40 mg total) by mouth daily.   PREDNISONE (DELTASONE) 10 MG TABLET    Take 50 mg (5 tablets) by mouth daily for 1 day, then, Take 40 mg (4 tablets) by mouth daily for 1 day, then, Take 30 mg (3 tablets) by mouth daily for 1 day, then, Take 20 mg (2 tablets) by mouth daily for 1 day, then, Take 10 mg (1 tablets) by mouth daily for 1 day, then stop   TIOTROPIUM (SPIRIVA) 18 MCG INHALATION CAPSULE    Place 1 capsule (18 mcg total) into inhaler and inhale daily.   TIZANIDINE (ZANAFLEX) 4 MG TABLET    Take 1  tablet (4 mg total) by mouth 3 (three) times daily.   TRAMADOL (ULTRAM) 50 MG TABLET    Take 1 tablet (50 mg total) by mouth every 8 (eight) hours as needed.  Modified Medications   No medications on file  Discontinued Medications   No medications on file     Physical Exam: Filed Vitals:   02/15/15 1440  BP: 140/80  Pulse: 90  Temp: 97.6 F (36.4 C)  Resp: 18    Physical Exam  Constitutional: He is oriented to person, place, and time. He appears well-developed and well-nourished.  lying in bed in NAD  HENT:  Mouth/Throat: Oropharynx is clear and moist.  Eyes: EOM are normal. Pupils are equal, round, and reactive to light. No scleral icterus.  Neck: Neck supple.  Cardiovascular: Normal rate.  An irregular rhythm present. Exam reveals no gallop and no friction rub.   Murmur heard.  Systolic murmur is present with a grade of 1/6  Pulmonary/Chest: Effort normal and breath sounds normal.  Abdominal: Soft. Bowel sounds are normal. He exhibits no abdominal bruit and no pulsatile midline mass.  Musculoskeletal: He exhibits no edema.  Lymphadenopathy:    He has no cervical adenopathy.  Neurological: He is alert and oriented to person, place, and time.  Skin: Skin is warm and dry.  Psychiatric: He has a normal mood and affect.    Labs reviewed: Basic Metabolic Panel:  Recent Labs  02/08/15 0551  02/09/15 0425 02/10/15 0530  NA 144 147* 144  K 4.0 3.8 3.8  CL 109 112* 108  CO2 '28 28 30  '$ GLUCOSE 124* 110* 116*  BUN 8 <5* 11  CREATININE 0.82 0.70 0.90  CALCIUM 9.6 9.2 9.7   Liver Function Tests:  Recent Labs  08/26/14 1730 02/06/15 1310 02/08/15 0551  AST 29 16 13*  ALT 26 17 13*  ALKPHOS 69 66 63  BILITOT 0.7 0.5 0.7  PROT 7.4 6.2* 6.0*  ALBUMIN 4.0 3.7 3.4*    Recent Labs  06/18/14 1918  LIPASE 17*   No results for input(s): AMMONIA in the last 8760 hours. CBC:  Recent Labs  02/08/15 0551 02/08/15 1515 02/09/15 0425 02/11/15 0334  WBC 5.4 5.7 5.5 6.5  NEUTROABS 3.0 3.9 3.4  --   HGB 14.8 13.0 12.9* 13.2  HCT 45.5 41.3 41.0 41.2  MCV 94.6 94.5 94.3 93.6  PLT 114* 106* 108* 132*   TSH: No results for input(s): TSH in the last 8760 hours. A1C: Lab Results  Component Value Date   HGBA1C 6.7* 02/06/2015   Lipid Panel:  Recent Labs  08/02/14 0445 02/06/15 1830  CHOL 130 231*  HDL 30* 47  LDLCALC 75 150*  TRIG 125 172*  CHOLHDL 4.3 4.9    Radiological Exams: Dg Chest 2 View  02/06/2015  CLINICAL DATA:  CVA. EXAM: CHEST  2 VIEW COMPARISON:  08/19/2014 chest radiograph. FINDINGS: Stable cardiomediastinal silhouette with normal heart size. No pneumothorax. No pleural effusion. Clear lungs, with no focal lung consolidation and no pulmonary edema. IMPRESSION: No active cardiopulmonary disease. Electronically Signed   By: Ilona Sorrel M.D.   On: 02/06/2015 21:54   Ct Head Wo Contrast  02/06/2015  CLINICAL DATA:  Left-sided weakness for 2-3 days. History of multiple strokes. EXAM: CT HEAD WITHOUT CONTRAST TECHNIQUE: Contiguous axial images were obtained from the base of the skull through the vertex without intravenous contrast. COMPARISON:  08/27/2014 FINDINGS: Mild chronic small vessel disease throughout the deep white matter. No acute intracranial abnormality.  Specifically, no hemorrhage, hydrocephalus, mass lesion, acute infarction, or  significant intracranial injury. No acute calvarial abnormality. Opacified left maxillary sinus is stable since prior study. Postoperative changes in the left anterior maxillary wall and floor the left orbit. Mastoid air cells are clear. IMPRESSION: Chronic small vessel disease throughout the deep white matter. No acute intracranial abnormality. Electronically Signed   By: Rolm Baptise M.D.   On: 02/06/2015 12:42   Mr Brain Wo Contrast  02/06/2015  CLINICAL DATA:  65 year old male with left side weakness beginning 2 days ago. Initial encounter. EXAM: MRI HEAD WITHOUT CONTRAST TECHNIQUE: Multiplanar, multiecho pulse sequences of the brain and surrounding structures were obtained without intravenous contrast. COMPARISON:  Head CT without contrast 1233 hours today. Brain MRI 08/27/2014. FINDINGS: Chronically abnormal appearance of the right ICA siphon, but with suggestion of reconstituted flow at the right ICA terminus. Overall Major intracranial vascular flow voids are stable since July. Patchy mostly cortically based restricted diffusion in the right superior frontal gyrus involving the MCA territory (some MCA/ACA watershed area in the pre motor region). White matter extension to the right corona radiata on series 4, image 18. No contralateral left hemisphere or posterior fossa restricted diffusion. Associated patchy T2 and FLAIR hyperintensity in the acutely affected areas. No definite associated acute hemorrhage, although there is probably vascular related T2* asymmetry along the medial right hemisphere (series 8, images 16-19) which is new since July. Chronic micro hemorrhage in the right occipital lobe is unchanged. Widespread scattered cerebral white matter T2 and FLAIR hyperintensity elsewhere in both hemispheres, greater on the right, then is stable since July. No midline shift, mass effect, evidence of mass lesion, ventriculomegaly, extra-axial collection or acute intracranial hemorrhage. Cervicomedullary  junction and pituitary are within normal limits. Negative visualized cervical spine. Stable paranasal sinuses and mastoids. Interval postoperative changes to the left globe, otherwise stable orbit and scalp soft tissues. Visualized bone marrow signal is within normal limits. IMPRESSION: 1. 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 2. Chronic poor flow in the right ICA - See also 08/27/2014 Head MRA report. Electronically Signed   By: Genevie Ann M.D.   On: 02/06/2015 16:19    Assessment/Plan 1. Cerebrovascular accident (CVA) due to stenosis of right carotid artery ([place]) Secondary to known right ICA supraclinoid near occlusion.  patient underwent catheter guided cerebral angiogram with stenting of RT ICA supraclinoid segment during hospitalization on 12/30. Recommendations were to aspirin/Plavix and statin. Pt to cont this at this time.   2. Essential hypertension Stable on coreg  3. Coronary artery disease involving native coronary artery of native heart without angina pectoris Stable, without chest pains. conts on plavix, asa and coreg  4. Chronic bronchitis, unspecified chronic bronchitis type ([place]) Remains stable, cont current regimen.   5. Gastroesophageal reflux disease without esophagitis Stable, cont protonix  6. HLD (hyperlipidemia) - cont on lipitor, dose recently increased to 80 mg due to not being at goal  7. Idiopathic gout of foot, unspecified chronicity, unspecified laterality -conts prednisone titration, titration will be complete prior to discharge, if pain persist will need to follow up with PCP for ongoing management.   8. PVD Pt was scheduled for right fem-below the knee popliteal bypass on 1/10th but vascular rescheduled procedure due to acute CVA.will need vascular follow up    pt is stable for discharge-will need PT/OT per home health. DME needed includes rollator. Rx written.  will need to follow up with PCP  within 2 weeks.       Carlos American. Harle Battiest  [facility] [phone] 8 am - 5 pm) [phone] (after hours)

## 2015-02-18 ENCOUNTER — Ambulatory Visit: Payer: Medicare Other | Admitting: Neurology

## 2015-02-18 DIAGNOSIS — I69354 Hemiplegia and hemiparesis following cerebral infarction affecting left non-dominant side: Secondary | ICD-10-CM | POA: Insufficient documentation

## 2015-02-19 ENCOUNTER — Telehealth (HOSPITAL_COMMUNITY): Payer: Self-pay

## 2015-02-19 ENCOUNTER — Encounter (HOSPITAL_COMMUNITY): Admission: RE | Payer: Self-pay | Source: Ambulatory Visit

## 2015-02-19 ENCOUNTER — Inpatient Hospital Stay (HOSPITAL_COMMUNITY): Admission: RE | Admit: 2015-02-19 | Payer: Medicare Other | Source: Ambulatory Visit | Admitting: Vascular Surgery

## 2015-02-19 SURGERY — BYPASS GRAFT FEMORAL-POPLITEAL ARTERY
Anesthesia: General | Site: Leg Upper | Laterality: Right

## 2015-02-19 NOTE — Telephone Encounter (Signed)
Called to schedule 2 wk f/u with Dr. Estanislado Pandy, left message for pt to return call. AW

## 2015-02-26 ENCOUNTER — Telehealth: Payer: Self-pay

## 2015-02-26 NOTE — Telephone Encounter (Signed)
Kendrick Fries with [facility] called requesting OT orders for patient. Kendrick Fries is requesting orders for OT. Kendrick Fries will have Clair Gulling, OT, to call nurse to give specifics of OT orders needed.  When nurse receives call from Clair Gulling, with more details, nurse will send message to Dr. [name] for orders.  Patient has appointment with [facility] tomorrow, Wednesday, January 18 at 12:00pm.

## 2015-02-26 NOTE — Telephone Encounter (Signed)
[name] with [name] needs verbal orders to continue to see patient. Patient was hospitalized at the end of December for stroke. [name] is requesting orders for PT to see patient twice a week for 5 weeks. Nurse will send message to provider.

## 2015-02-27 ENCOUNTER — Encounter: Payer: Self-pay | Admitting: Family Medicine

## 2015-02-27 ENCOUNTER — Ambulatory Visit: Payer: Medicare Other | Attending: Family Medicine | Admitting: Family Medicine

## 2015-02-27 ENCOUNTER — Telehealth: Payer: Self-pay | Admitting: Clinical

## 2015-02-27 VITALS — BP 88/52 | HR 65 | Temp 97.4°F | Resp 13 | Ht 72.0 in | Wt 254.6 lb

## 2015-02-27 DIAGNOSIS — I638 Other cerebral infarction: Secondary | ICD-10-CM

## 2015-02-27 DIAGNOSIS — M1000 Idiopathic gout, unspecified site: Secondary | ICD-10-CM | POA: Diagnosis not present

## 2015-02-27 DIAGNOSIS — I739 Peripheral vascular disease, unspecified: Secondary | ICD-10-CM | POA: Diagnosis not present

## 2015-02-27 DIAGNOSIS — Z8673 Personal history of transient ischemic attack (TIA), and cerebral infarction without residual deficits: Secondary | ICD-10-CM | POA: Insufficient documentation

## 2015-02-27 DIAGNOSIS — I69354 Hemiplegia and hemiparesis following cerebral infarction affecting left non-dominant side: Secondary | ICD-10-CM | POA: Diagnosis not present

## 2015-02-27 DIAGNOSIS — Z79899 Other long term (current) drug therapy: Secondary | ICD-10-CM | POA: Diagnosis not present

## 2015-02-27 DIAGNOSIS — I5032 Chronic diastolic (congestive) heart failure: Secondary | ICD-10-CM | POA: Insufficient documentation

## 2015-02-27 DIAGNOSIS — J449 Chronic obstructive pulmonary disease, unspecified: Secondary | ICD-10-CM

## 2015-02-27 DIAGNOSIS — K5900 Constipation, unspecified: Secondary | ICD-10-CM | POA: Diagnosis not present

## 2015-02-27 DIAGNOSIS — I10 Essential (primary) hypertension: Secondary | ICD-10-CM | POA: Insufficient documentation

## 2015-02-27 DIAGNOSIS — R600 Localized edema: Secondary | ICD-10-CM | POA: Insufficient documentation

## 2015-02-27 DIAGNOSIS — Z7982 Long term (current) use of aspirin: Secondary | ICD-10-CM | POA: Diagnosis not present

## 2015-02-27 DIAGNOSIS — Z7902 Long term (current) use of antithrombotics/antiplatelets: Secondary | ICD-10-CM | POA: Diagnosis not present

## 2015-02-27 DIAGNOSIS — I6523 Occlusion and stenosis of bilateral carotid arteries: Secondary | ICD-10-CM | POA: Insufficient documentation

## 2015-02-27 DIAGNOSIS — K5909 Other constipation: Secondary | ICD-10-CM

## 2015-02-27 DIAGNOSIS — I6389 Other cerebral infarction: Secondary | ICD-10-CM

## 2015-02-27 MED ORDER — ALLOPURINOL 300 MG PO TABS: 300.0000 mg | ORAL_TABLET | Freq: Every day | ORAL | Status: DC

## 2015-02-27 MED ORDER — ROLLATOR MISC: 1.0000 | Freq: Every day | Status: DC

## 2015-02-27 MED ORDER — COLCHICINE 0.6 MG PO TABS: ORAL_TABLET | ORAL | Status: DC

## 2015-02-27 MED ORDER — LACTULOSE 10 GM/15ML PO SOLN: 10.0000 g | Freq: Two times a day (BID) | ORAL | Status: DC | PRN

## 2015-02-27 NOTE — Telephone Encounter (Signed)
Attempt to f/u with patient concerning skilled nursing facility preferences, left HIPPA-compliant message to call Roselyn Reef from [facility] at [phone].

## 2015-02-27 NOTE — Progress Notes (Signed)
Patient here with his home health aid He was just released from a snf His aid is stating that patient is in need of more help with ADL's at this point He complains of generalized pain and pedal edema

## 2015-02-27 NOTE — Telephone Encounter (Signed)
Clair Gulling, occupational therapy, with [facility] called nurse, left voicemail requesting OT for patient 2 times a week for 3 weeks and then 1 time a week for 1 week.  Patient has appointment today with Dr. [name]. Nurse will send r. [place] message.

## 2015-02-27 NOTE — Telephone Encounter (Signed)
During visit today, patients care giver requested nurse to call Kennyth Lose with [facility] at [phone] regarding patients home health hours.  Kennyth Lose with [facility] is requesting more hours for home health activities such as bathing, grooming, helping him to take care of himself with activities of daily living, laundry, changing linens, transferring from bed to chair, clear clutter from room to room, cooking, cleaning, reminders to take.  Kennyth Lose is not requesting hours for physical therapy or occupational therapy.  Per Kennyth Lose, provider needs to document loss of memory also due to patient not remembering some things according to [place].  Per Kennyth Lose, patient has 61 hours a month, equals 1.5-2 hours a day. Patient needs approximately 130 hours a day. Nurse will send message to provider.

## 2015-02-27 NOTE — Patient Instructions (Signed)
Hypotension  As your heart beats, it forces blood through your arteries. This force is your blood pressure. If your blood pressure is too low for you to go about your normal activities or to support the organs of your body, you have hypotension. Hypotension is also referred to as low blood pressure. When your blood pressure becomes too low, you may not get enough blood to your brain. As a result, you may feel weak, feel lightheaded, or develop a rapid heart rate. In a more severe case, you may faint.  CAUSES  Various conditions can cause hypotension. These include:  · Blood loss.  · Dehydration.  · Heart or endocrine problems.  · Pregnancy.  · Severe infection.  · Not having a well-balanced diet filled with needed nutrients.  · Severe allergic reactions (anaphylaxis).  Some medicines, such as blood pressure medicine or water pills (diuretics), may lower your blood pressure below normal. Sometimes taking too much medicine or taking medicine not as directed can cause hypotension.  TREATMENT   Hospitalization is sometimes required for hypotension if fluid or blood replacement is needed, if time is needed for medicines to wear off, or if further monitoring is needed. Treatment might include changing your diet, changing your medicines (including medicines aimed at raising your blood pressure), and use of support stockings.  HOME CARE INSTRUCTIONS   · Drink enough fluids to keep your urine clear or pale yellow.  · Take your medicines as directed by your health care provider.  · Get up slowly from reclining or sitting positions. This gives your blood pressure a chance to adjust.  · Wear support stockings as directed by your health care provider.  · Maintain a healthy diet by including nutritious food, such as fruits, vegetables, nuts, whole grains, and lean meats.  SEEK MEDICAL CARE IF:  · You have vomiting or diarrhea.  · You have a fever for more than 2-3 days.  · You feel more thirsty than usual.  · You feel weak and  tired.  SEEK IMMEDIATE MEDICAL CARE IF:   · You have chest pain or a fast or irregular heartbeat.  · You have a loss of feeling in some part of your body, or you lose movement in your arms or legs.  · You have trouble speaking.  · You become sweaty or feel lightheaded.  · You faint.  MAKE SURE YOU:   · Understand these instructions.  · Will watch your condition.  · Will get help right away if you are not doing well or get worse.     This information is not intended to replace advice given to you by your health care provider. Make sure you discuss any questions you have with your health care provider.     Document Released: 01/26/2005 Document Revised: 11/16/2012 Document Reviewed: 07/29/2012  Elsevier Interactive Patient Education ©2016 [employer]

## 2015-02-28 ENCOUNTER — Telehealth: Payer: Self-pay | Admitting: Family Medicine

## 2015-02-28 NOTE — Progress Notes (Signed)
Subjective:  Patient ID: [name], male    DOB: Dec 14, 1950  Age: 65 y.o. MRN: [id number]  CC: Follow-up   HPI EDAHI KROENING is a 65 year old male with a history of HTN, CHF (EF 50-55%), COPD, CVA with left hemiparesis, PVD discharged from a SNF last week Wednesday after hospitalization at [facility] from 02/06/15 through 02/13/15 for recurrent right MCA territory infarct thought to be secondary to known right ICA near occlusion. He had presented with left sided weakness and did not receive thrombolytic therapy due to late presentation .Seen by neurology, recommendations were for cerebral angiogram and stenting.Interventional radiology was consulted, patient underwent catheter guided cerebral angiogram with stenting of R  ICA supraclinoid segment on 12/30. Recommendation was for dual antiplatelet therapy with ASa and Plavix for at least 6 months.  Interval History: He is accompanied by his PCS worker who states the patient requested to be discharged from the SNF . He has ambulation difficulties and is needing a Primary school teacher with a seat; he would need increased hours of care due to high risk for falls. His blood pressure is on the low side and he is not here with all his medications but denies any dizziness. He has also had some memory issues. He complains of right big toe pain and constipation.  Outpatient Prescriptions Prior to Visit  Medication Sig Dispense Refill  . albuterol (PROVENTIL HFA;VENTOLIN HFA) 108 (90 BASE) MCG/ACT inhaler Inhale 2 puffs into the lungs every 6 (six) hours as needed for wheezing or shortness of breath. 1 Inhaler 3  . albuterol (PROVENTIL) (2.5 MG/3ML) 0.083% nebulizer solution Take 3 mLs (2.5 mg total) by nebulization 2 (two) times daily as needed for wheezing or shortness of breath. 75 mL 0  . aspirin 325 MG EC tablet Take 1 tablet (325 mg total) by mouth daily. (Patient taking differently: Take 325 mg by mouth every 6 (six) hours as needed  for pain. ) 30 tablet 0  . atorvastatin (LIPITOR) 40 MG tablet Take 2 tablets (80 mg total) by mouth daily at 6 PM.    . Besifloxacin HCl (BESIVANCE) 0.6 % SUSP Place 1 drop into the left eye 2 (two) times daily.    . carvedilol (COREG) 6.25 MG tablet Take 2 tablets (12.5 mg total) by mouth 2 (two) times daily with a meal.    . clopidogrel (PLAVIX) 75 MG tablet Take 1 tablet (75 mg total) by mouth daily. 30 tablet 2  . furosemide (LASIX) 40 MG tablet Take 1 tablet (40 mg total) by mouth 2 (two) times daily. 60 tablet 2  . gabapentin (NEURONTIN) 300 MG capsule Take 1 capsule (300 mg total) by mouth 3 (three) times daily.    . mometasone-formoterol (DULERA) 200-5 MCG/ACT AERO Inhale 2 puffs into the lungs 2 (two) times daily. 13 g 3  . pantoprazole (PROTONIX) 40 MG tablet Take 1 tablet (40 mg total) by mouth daily.    [name] tiotropium (SPIRIVA) 18 MCG inhalation capsule Place 1 capsule (18 mcg total) into inhaler and inhale daily. 30 capsule 3  . tiZANidine (ZANAFLEX) 4 MG tablet Take 1 tablet (4 mg total) by mouth 3 (three) times daily. (Patient taking differently: Take 4 mg by mouth every 8 (eight) hours as needed for muscle spasms. ) 90 tablet 1  . traMADol (ULTRAM) 50 MG tablet Take 1 tablet (50 mg total) by mouth every 8 (eight) hours as needed. 20 tablet 0  . predniSONE (DELTASONE) 10 MG tablet Take  50 mg (5 tablets) by mouth daily for 1 day, then, Take 40 mg (4 tablets) by mouth daily for 1 day, then, Take 30 mg (3 tablets) by mouth daily for 1 day, then, Take 20 mg (2 tablets) by mouth daily for 1 day, then, Take 10 mg (1 tablets) by mouth daily for 1 day, then stop (Patient not taking: Reported on 02/27/2015)     No facility-administered medications prior to visit.    ROS Review of Systems  Constitutional: Negative for activity change and appetite change.  HENT: Negative for sinus pressure and sore throat.   Eyes: Negative for visual disturbance.  Respiratory: Negative for cough, chest  tightness and shortness of breath.   Cardiovascular: Positive for leg swelling. Negative for chest pain.  Gastrointestinal: Positive for constipation. Negative for abdominal pain, diarrhea and abdominal distention.  Endocrine: Negative.   Genitourinary: Negative for dysuria.  Musculoskeletal:       R big toe pain  Skin: Negative for rash.  Allergic/Immunologic: Negative.   Neurological: Positive for weakness (left-sided). Negative for light-headedness and numbness.       Memory problems   Psychiatric/Behavioral: Negative for suicidal ideas and dysphoric mood.    Objective:  BP 88/52 mmHg  Pulse 65  Temp(Src) 97.4 F (36.3 C)  Resp 13  Ht 6' (1.829 m)  Wt 254 lb 9.6 oz (115.486 kg)  BMI 34.52 kg/m2  SpO2 96%  BP/Weight 02/27/2015 0/04/4915 10/11/5054  Systolic BP 88 979 480  Diastolic BP 52 80 64  Wt. (Lbs) 254.6 - -  BMI 34.52 - -      Physical Exam  Constitutional: He is oriented to person, place, and time. He appears well-developed and well-nourished.  Neck: No JVD present. No tracheal deviation present.  Cardiovascular: Normal rate, regular rhythm and normal heart sounds.   No murmur heard. Unable to palpate dorsalis pedis pulses due to edema  Pulmonary/Chest: Effort normal and breath sounds normal. No respiratory distress. He has no wheezes. He exhibits no tenderness.  Abdominal: Soft. Bowel sounds are normal. He exhibits no mass. There is no tenderness.  Musculoskeletal: Normal range of motion. He exhibits edema (bilateral nonpitting pedal edema up to lower third of leg) and tenderness (right big toe tenderness).  Neurological: He is alert and oriented to person, place, and time.  Skin: Skin is warm and dry.  Psychiatric: He has a normal mood and affect.     Assessment & Plan:   1. Peripheral vascular disease ([place]) Currently with pedal edema and claudication pains. Scheduled to see his vascular surgeon for redo right femoral to below knee popliteal  bypass..  2. Essential hypertension Blood pressure is on the low side and so I have advised the ED to keep a check of his blood pressure and call the clinic tomorrow with the value. I will reassess him at his next visit for the need to reduce the dose of his antihypertensive.  3. Idiopathic gout, unspecified chronicity, unspecified site Current flare with pain in right big toe. I have placed him on gout medications and educated him to use the colchicine with acute after which he will use silk. All once results. - allopurinol (ZYLOPRIM) 300 MG tablet; Take 1 tablet (300 mg total) by mouth daily.  Dispense: 30 tablet; Refill: 2 - colchicine 0.6 MG tablet; Take 2 tabs (1.2 mg) at the onset of a gout attack, may repeat 1 tablet (0.6 mg) in one hour if symptoms persist  Dispense: 30 tablet; Refill: 1  4. Chronic diastolic congestive heart failure (HCC) EF of 53-29%, grade 1 diastolic dysfunction from 2-D echo of 07/2014  5. Chronic obstructive pulmonary disease, unspecified COPD type ([place]) No acute exacerbation  6. Bilateral edema of lower extremity Advised to elevate feet and use compression stockings if he is able to. Low-sodium diet. He remains on Lasix.  7. Cerebral infarction due to other mechanism [facility]) Dual antiplatelet therapy (with ASA and Plavix) for at least 6 months as per Neurology He is high risk for falls and will benefit from increased hours but have been informed by his PCP  worker that the states only approves 80 hours a month and so we will proceed with trying to get him into a skilled nursing facility as he was prematurely discharged at his request - Misc. Devices (ROLLATOR) MISC; 1 each by Does not apply route daily.  Dispense: 1 each; Refill: 0  8. Intracranial carotid stenosis, bilateral Status post placement of stent to right ICA Advised to call his interventional radiologist for a follow-up appointment. - Misc. Devices (ROLLATOR) MISC; 1 each by Does not apply route  daily.  Dispense: 1 each; Refill: 0  9. Other constipation Advised on dietary modifications increase fiber and fluid intake. - lactulose (CHRONULAC) 10 GM/15ML solution; Take 15 mLs (10 g total) by mouth 2 (two) times daily as needed for mild constipation.  Dispense: 946 mL; Refill: 2   Meds ordered this encounter  Medications  . lactulose (CHRONULAC) 10 GM/15ML solution    Sig: Take 15 mLs (10 g total) by mouth 2 (two) times daily as needed for mild constipation.    Dispense:  946 mL    Refill:  2  . allopurinol (ZYLOPRIM) 300 MG tablet    Sig: Take 1 tablet (300 mg total) by mouth daily.    Dispense:  30 tablet    Refill:  2  . DISCONTD: Misc. Devices (ROLLATOR) MISC    Sig: 1 each by Does not apply route daily.    Dispense:  1 each    Refill:  0    Rollator walker with seat  . colchicine 0.6 MG tablet    Sig: Take 2 tabs (1.2 mg) at the onset of a gout attack, may repeat 1 tablet (0.6 mg) in one hour if symptoms persist    Dispense:  30 tablet    Refill:  1  . Misc. Devices (ROLLATOR) MISC    Sig: 1 each by Does not apply route daily.    Dispense:  1 each    Refill:  0    Rollator walker with seat    Follow-up: Return in about 2 weeks (around 03/13/2015) for Follow-up of blood pressure.   Arnoldo Morale MD

## 2015-02-28 NOTE — Telephone Encounter (Signed)
RN routing results to MD

## 2015-02-28 NOTE — Telephone Encounter (Signed)
Patients PCA called delivering BP readings for patient. PCA stated that during last office visit patient had low BP Readings   Readings 9:15 am 130/90 11:10 am 123/67

## 2015-03-05 NOTE — Telephone Encounter (Signed)
Roland, physical therapist with [facility] called and left voicemail for nurse to return call regarding patients physical therapy orders. Nurse called Paulo Fruit, reached voicemail. Left message for Paulo Fruit to call Nira Conn with [facility], at [phone]. Nurse spoke to Dr. [name]. Dr. [name] approves patients occupational therapy and physical therapy orders.  Nurse called [facility], reached [place]. Nurse called [facility] to make Kendrick Fries and [place] aware of occupational and physical therapy orders.  Secretary took message and will have Kendrick Fries return call to [place] at [place].

## 2015-03-05 NOTE — Telephone Encounter (Signed)
Roland with [facility] returned call.  Roland aware of Dr. [name] giving verbal orders for physical therapy twice a week for 5 weeks.  Patient voices understanding and has no further questions at this time.

## 2015-03-06 NOTE — Telephone Encounter (Signed)
Nurse called Kendrick Fries with [facility]. Kendrick Fries is aware of Dr. [name] giving verbal orders for physical therapy twice a week for 5 weeks. Kendrick Fries is aware of nurse contacting [place] yesterday with verbal orders for physical therapy. Kendrick Fries aware of Dr. [name] giving verbal orders for occupational therapy, 2 times a week for 3 weeks, then 1 time a week for 1 week.  Nurse made yvonne aware of [facility] being involved in patients personal care.

## 2015-03-09 IMAGING — CR DG CHEST 2V
2 series · 2 of 2 positions shown · non-contrast
Comparison: 04/09/2013

CLINICAL DATA: Pain between shoulder blades radiating down back,
history hypertension, coronary artery disease post MI, CHF, COPD,
asthma, smoking

EXAM:
CHEST  2 VIEW

[w chest pa]
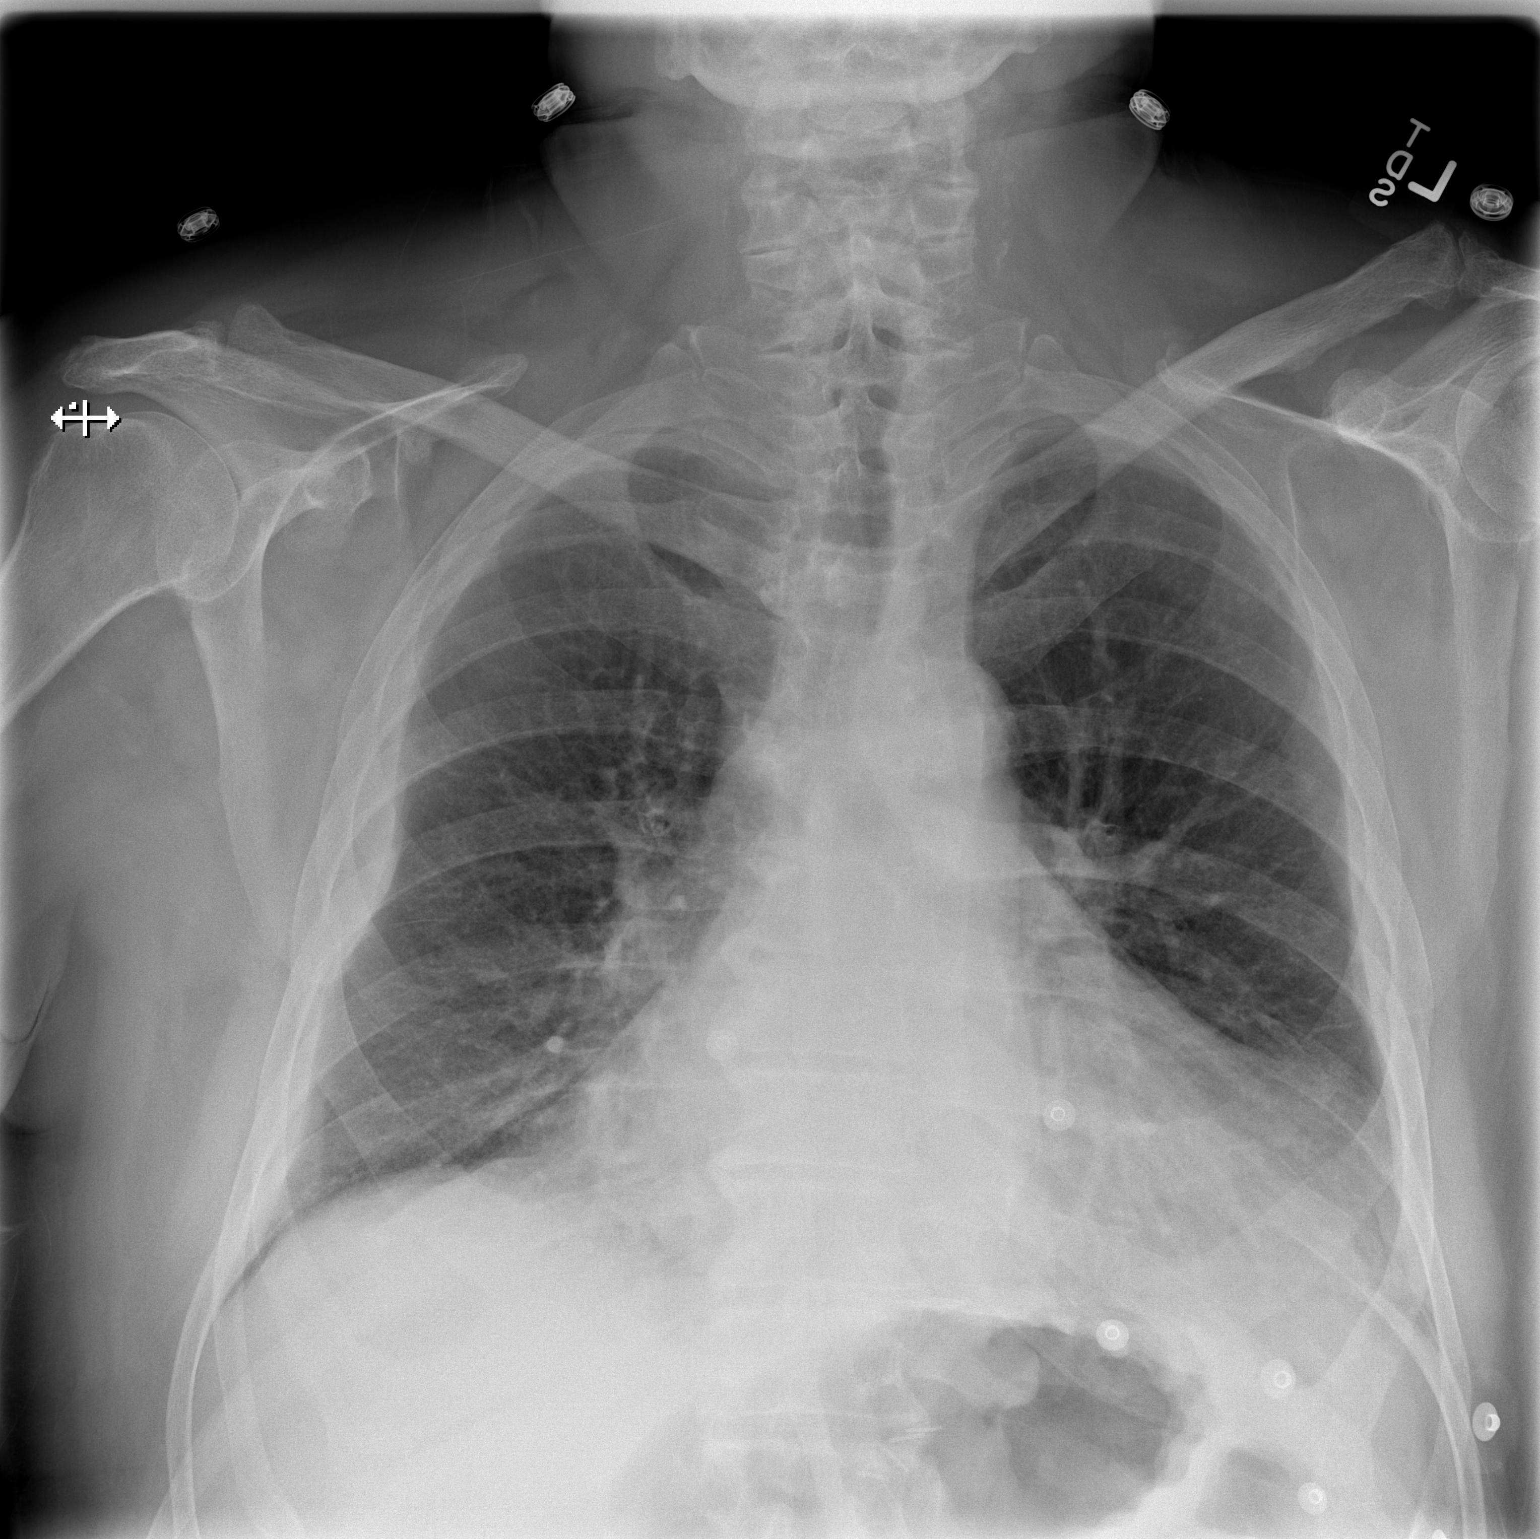

[w chest lat]
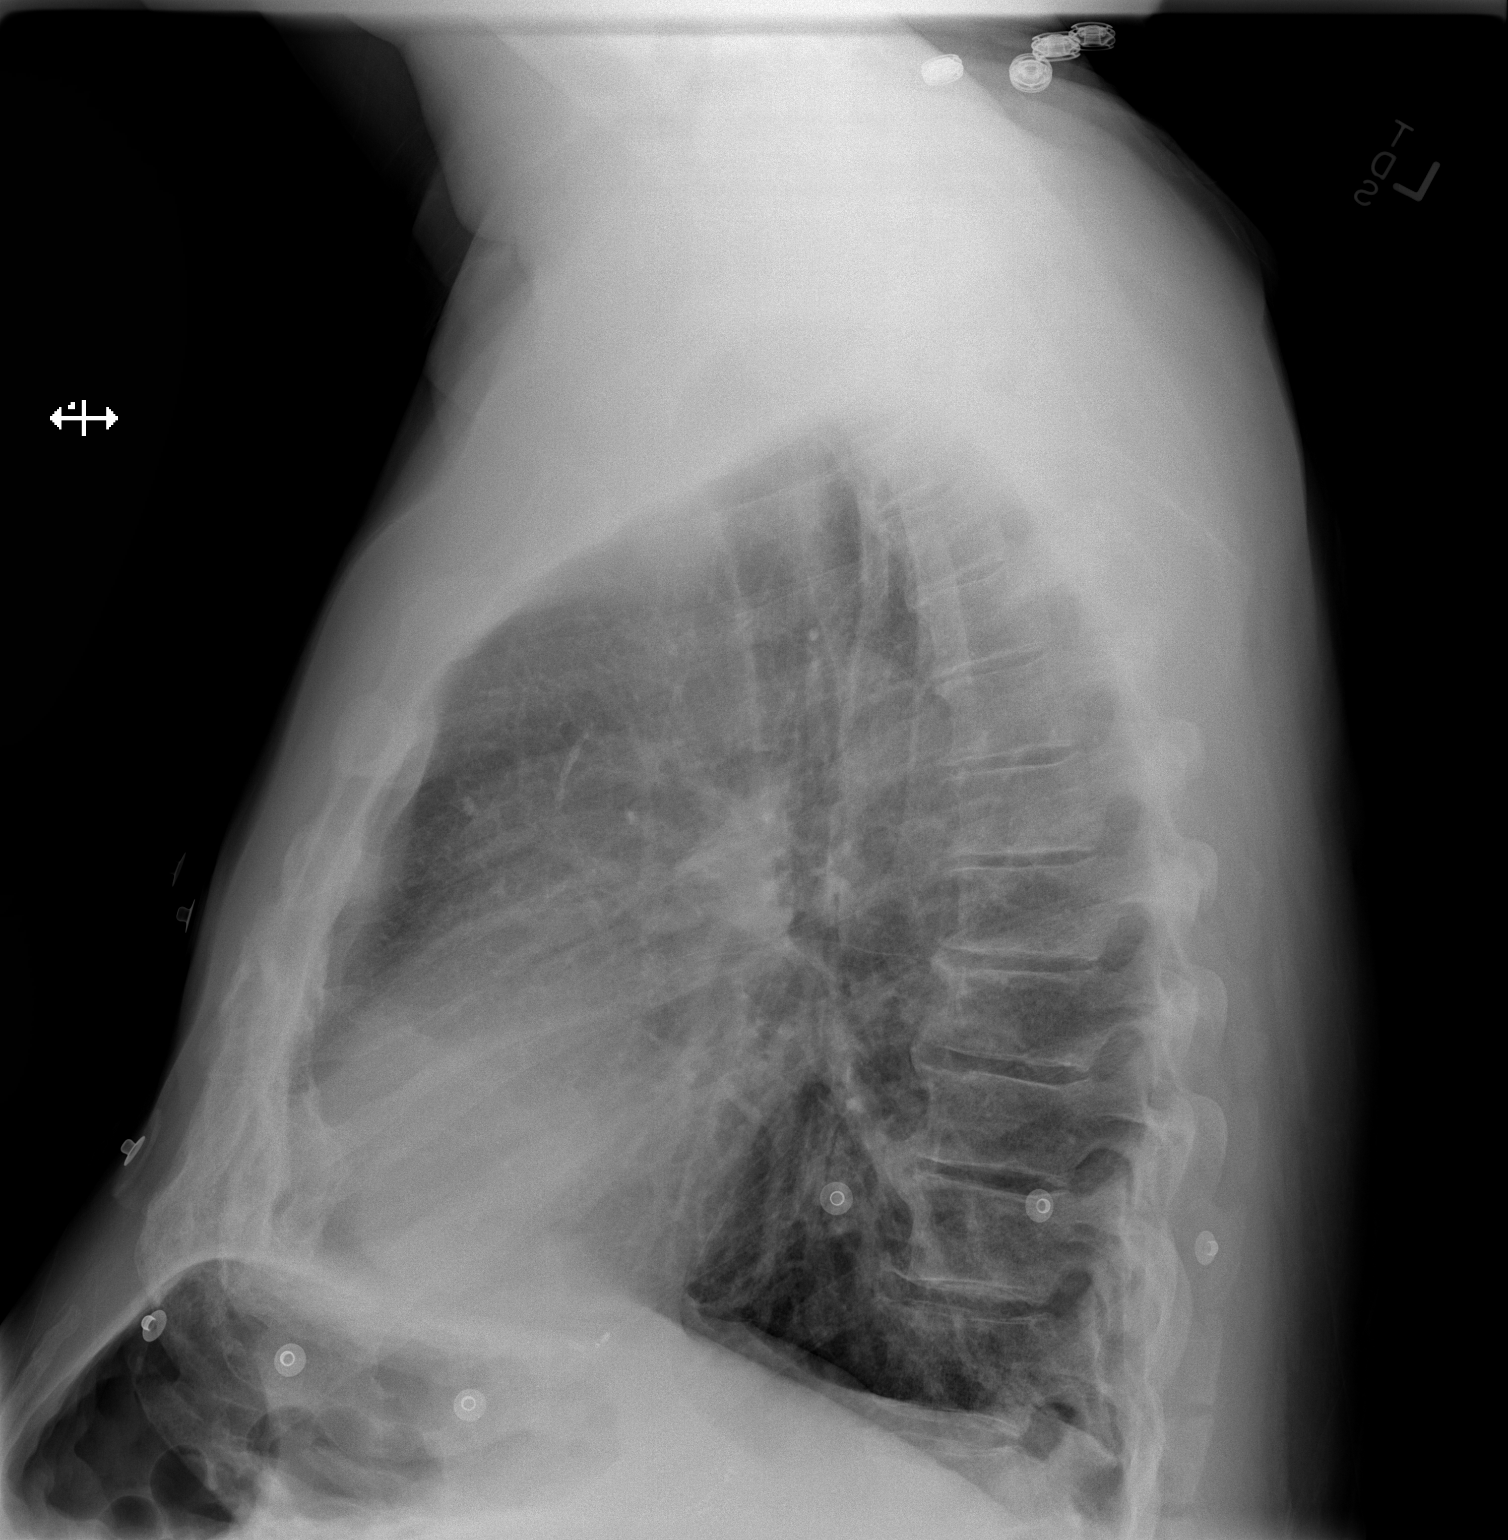

[2 of 2 positions shown; findings below may reference images not displayed]

FINDINGS: Enlargement of cardiac silhouette with pulmonary vascular
congestion.

Mediastinal contours normal.

Bronchitic and emphysematous changes consistent with COPD.

No pulmonary Infiltrate, pleural effusion or pneumothorax.

Chronic pleural thickening at the lateral mid RIGHT chest unchanged.
IMPRESSION: Enlargement of cardiac silhouette with pulmonary vascular
congestion.

COPD changes.

No acute abnormalities.

## 2015-03-09 IMAGING — CR DG CERVICAL SPINE COMPLETE 4+V
6 series · 6 of 6 positions shown · non-contrast
Comparison: None.

CLINICAL DATA: Pain

EXAM:
CERVICAL SPINE  4+ VIEWS

[w cervical spine lat]
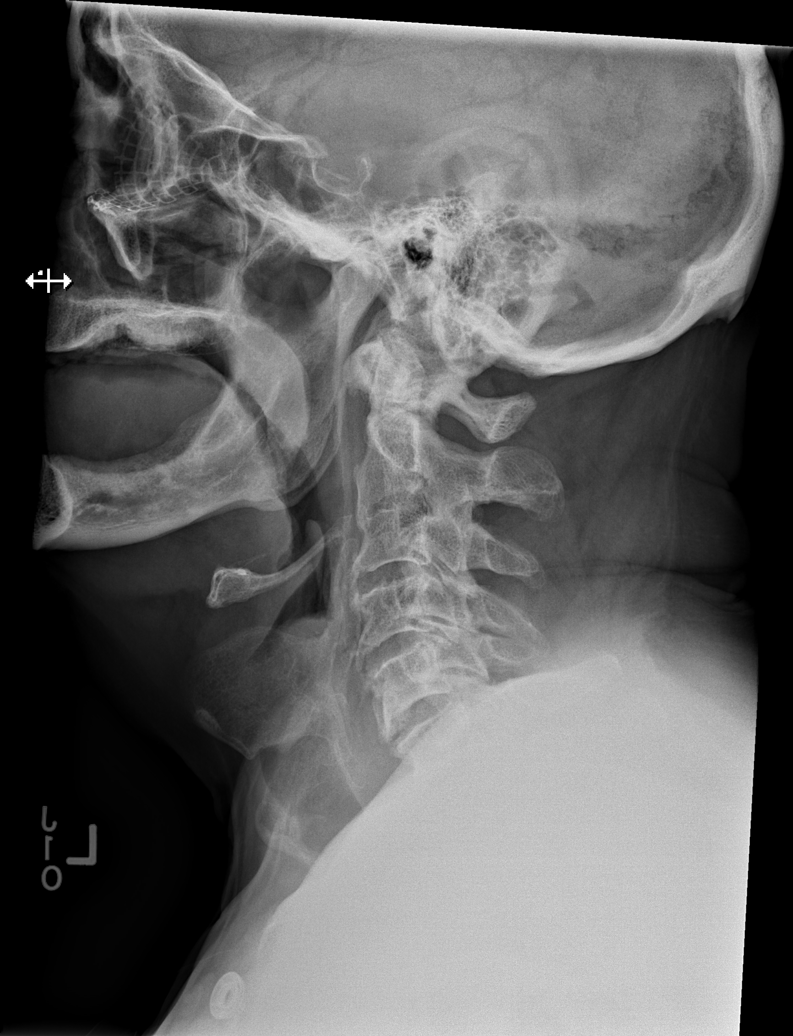

[w cervical spine ap_obl (1 of 2)]
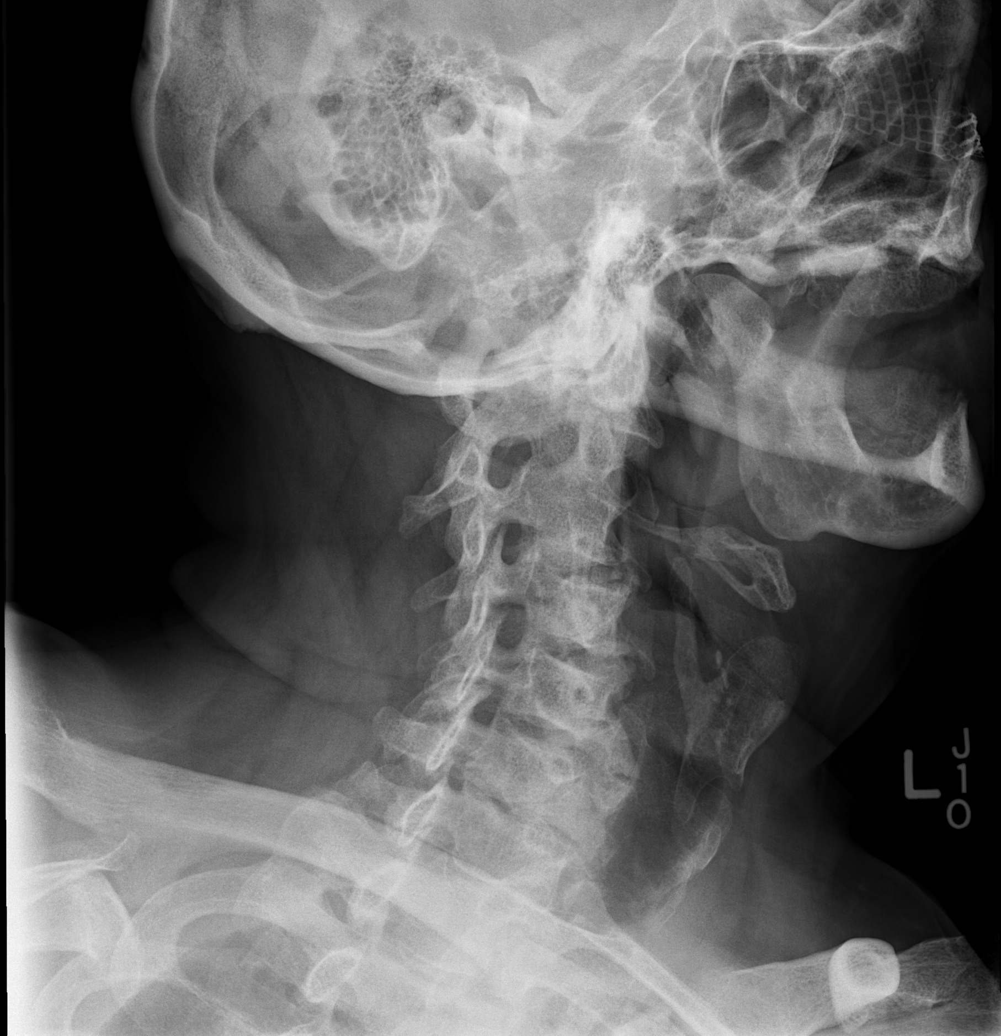

[w cervical spine ap_obl (2 of 2)]
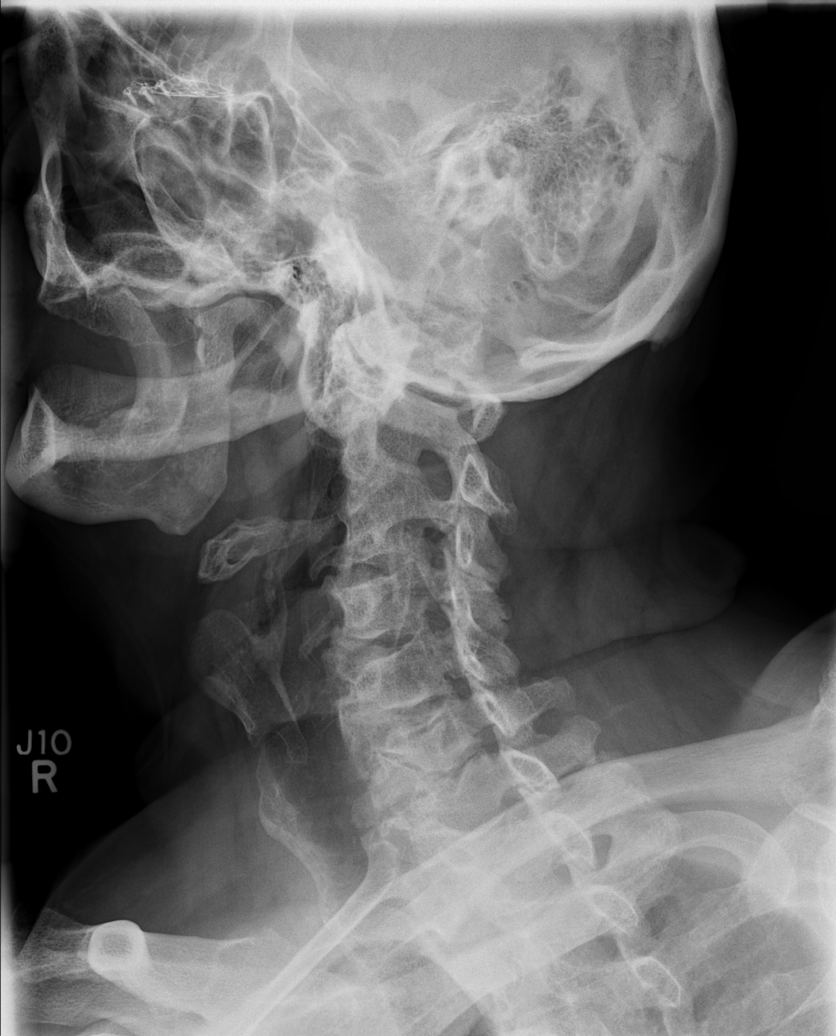

[w cervical spine ap]
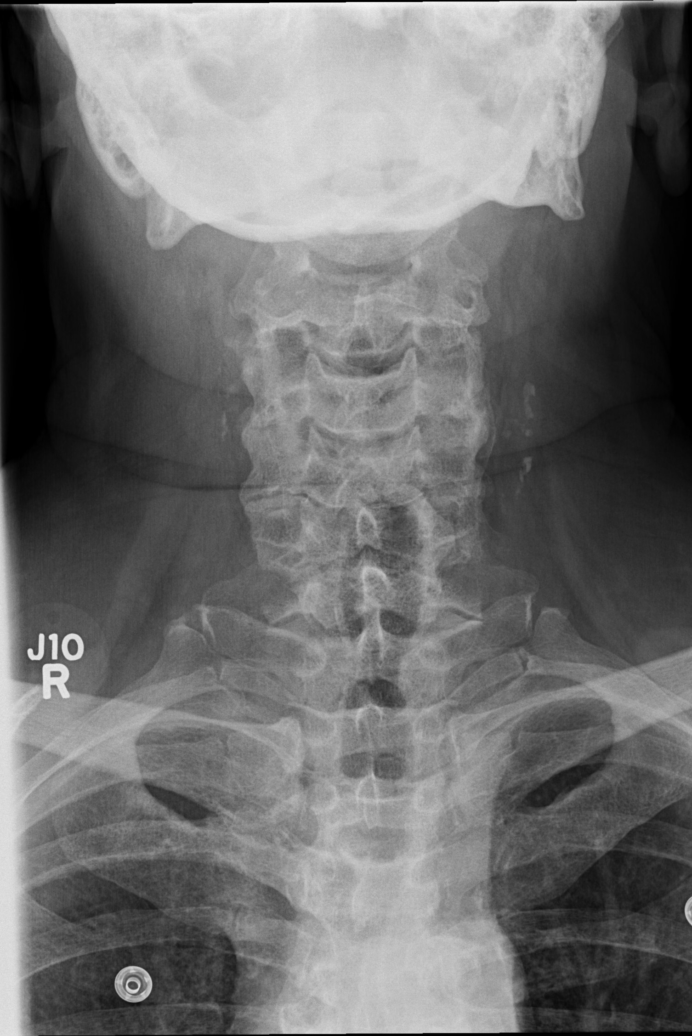

[w cervical spine odontoid]
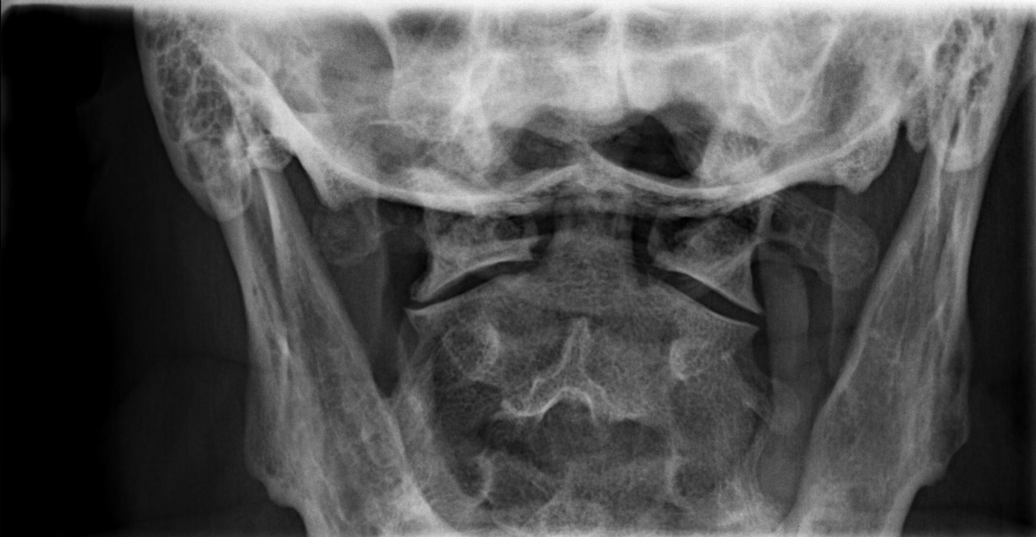

[w cervical swimmers]
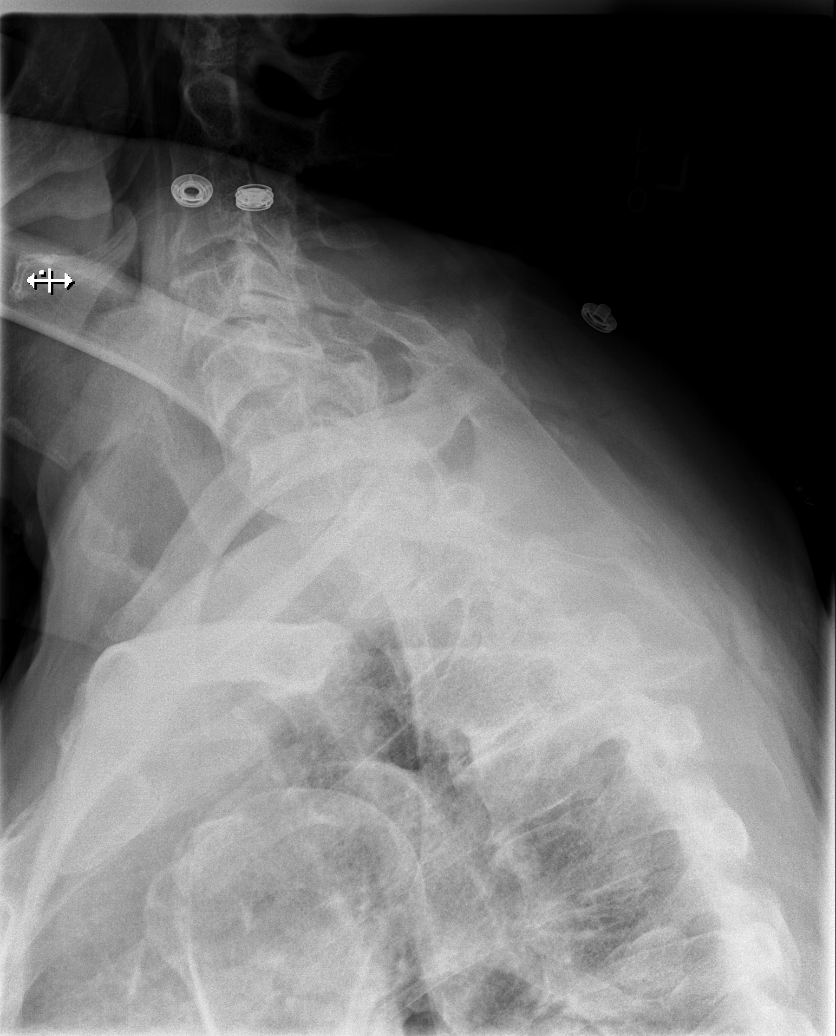

[6 of 6 positions shown; findings below may reference images not displayed]

FINDINGS: Frontal, lateral, open-mouth odontoid, and bilateral oblique views
were obtained. There is no fracture or spondylolisthesis.
Prevertebral soft tissues and predental space regions are normal.
There is ankylosis at C2-3. There is moderate disc space narrowing
at C5-6 and C6-7. There are anterior osteophytes at all levels.
There is exit foraminal narrowing to varying degrees at all levels
bilaterally. No erosive change.
IMPRESSION: Multifocal osteoarthritic change.  No fracture or spondylolisthesis.

## 2015-03-12 ENCOUNTER — Other Ambulatory Visit (HOSPITAL_COMMUNITY): Payer: Self-pay | Admitting: Interventional Radiology

## 2015-03-12 DIAGNOSIS — I771 Stricture of artery: Secondary | ICD-10-CM

## 2015-03-13 ENCOUNTER — Ambulatory Visit: Payer: Medicare Other | Attending: Family Medicine | Admitting: Family Medicine

## 2015-03-13 ENCOUNTER — Encounter: Payer: Self-pay | Admitting: Family Medicine

## 2015-03-13 VITALS — BP 145/77 | HR 89 | Temp 97.5°F | Resp 18 | Ht 72.0 in | Wt 248.0 lb

## 2015-03-13 DIAGNOSIS — F1010 Alcohol abuse, uncomplicated: Secondary | ICD-10-CM | POA: Insufficient documentation

## 2015-03-13 DIAGNOSIS — I2510 Atherosclerotic heart disease of native coronary artery without angina pectoris: Secondary | ICD-10-CM | POA: Insufficient documentation

## 2015-03-13 DIAGNOSIS — Z79899 Other long term (current) drug therapy: Secondary | ICD-10-CM | POA: Diagnosis not present

## 2015-03-13 DIAGNOSIS — Z7982 Long term (current) use of aspirin: Secondary | ICD-10-CM | POA: Diagnosis not present

## 2015-03-13 DIAGNOSIS — E1142 Type 2 diabetes mellitus with diabetic polyneuropathy: Secondary | ICD-10-CM

## 2015-03-13 DIAGNOSIS — I739 Peripheral vascular disease, unspecified: Secondary | ICD-10-CM | POA: Insufficient documentation

## 2015-03-13 DIAGNOSIS — F17210 Nicotine dependence, cigarettes, uncomplicated: Secondary | ICD-10-CM | POA: Insufficient documentation

## 2015-03-13 DIAGNOSIS — R739 Hyperglycemia, unspecified: Secondary | ICD-10-CM

## 2015-03-13 DIAGNOSIS — J449 Chronic obstructive pulmonary disease, unspecified: Secondary | ICD-10-CM | POA: Diagnosis not present

## 2015-03-13 DIAGNOSIS — Z888 Allergy status to other drugs, medicaments and biological substances status: Secondary | ICD-10-CM | POA: Insufficient documentation

## 2015-03-13 DIAGNOSIS — I639 Cerebral infarction, unspecified: Secondary | ICD-10-CM | POA: Diagnosis not present

## 2015-03-13 DIAGNOSIS — J45909 Unspecified asthma, uncomplicated: Secondary | ICD-10-CM | POA: Diagnosis not present

## 2015-03-13 DIAGNOSIS — Z9889 Other specified postprocedural states: Secondary | ICD-10-CM | POA: Insufficient documentation

## 2015-03-13 DIAGNOSIS — I6523 Occlusion and stenosis of bilateral carotid arteries: Secondary | ICD-10-CM | POA: Insufficient documentation

## 2015-03-13 DIAGNOSIS — E119 Type 2 diabetes mellitus without complications: Secondary | ICD-10-CM | POA: Diagnosis not present

## 2015-03-13 DIAGNOSIS — E785 Hyperlipidemia, unspecified: Secondary | ICD-10-CM | POA: Diagnosis not present

## 2015-03-13 DIAGNOSIS — M1000 Idiopathic gout, unspecified site: Secondary | ICD-10-CM | POA: Insufficient documentation

## 2015-03-13 DIAGNOSIS — M545 Low back pain: Secondary | ICD-10-CM | POA: Insufficient documentation

## 2015-03-13 DIAGNOSIS — G4733 Obstructive sleep apnea (adult) (pediatric): Secondary | ICD-10-CM | POA: Diagnosis not present

## 2015-03-13 DIAGNOSIS — Z9049 Acquired absence of other specified parts of digestive tract: Secondary | ICD-10-CM | POA: Insufficient documentation

## 2015-03-13 DIAGNOSIS — Z86718 Personal history of other venous thrombosis and embolism: Secondary | ICD-10-CM | POA: Diagnosis not present

## 2015-03-13 DIAGNOSIS — I252 Old myocardial infarction: Secondary | ICD-10-CM | POA: Diagnosis not present

## 2015-03-13 DIAGNOSIS — M62838 Other muscle spasm: Secondary | ICD-10-CM

## 2015-03-13 DIAGNOSIS — F329 Major depressive disorder, single episode, unspecified: Secondary | ICD-10-CM | POA: Insufficient documentation

## 2015-03-13 DIAGNOSIS — I5032 Chronic diastolic (congestive) heart failure: Secondary | ICD-10-CM | POA: Insufficient documentation

## 2015-03-13 DIAGNOSIS — Z96659 Presence of unspecified artificial knee joint: Secondary | ICD-10-CM | POA: Diagnosis not present

## 2015-03-13 DIAGNOSIS — M1990 Unspecified osteoarthritis, unspecified site: Secondary | ICD-10-CM | POA: Diagnosis not present

## 2015-03-13 DIAGNOSIS — Z7902 Long term (current) use of antithrombotics/antiplatelets: Secondary | ICD-10-CM | POA: Diagnosis not present

## 2015-03-13 DIAGNOSIS — I10 Essential (primary) hypertension: Secondary | ICD-10-CM | POA: Insufficient documentation

## 2015-03-13 DIAGNOSIS — K219 Gastro-esophageal reflux disease without esophagitis: Secondary | ICD-10-CM | POA: Insufficient documentation

## 2015-03-13 DIAGNOSIS — N39498 Other specified urinary incontinence: Secondary | ICD-10-CM

## 2015-03-13 HISTORY — DX: Type 2 diabetes mellitus without complications: E11.9

## 2015-03-13 HISTORY — DX: Type 2 diabetes mellitus with diabetic polyneuropathy: E11.42

## 2015-03-13 LAB — GLUCOSE, POCT (MANUAL RESULT ENTRY): POC Glucose: 151 mg/dL — AB (ref 70–99)

## 2015-03-13 MED ORDER — ACCU-CHEK AVIVA DEVI: Status: DC

## 2015-03-13 MED ORDER — GLUCOSE BLOOD VI STRP: ORAL_STRIP | Status: DC

## 2015-03-13 MED ORDER — FUROSEMIDE 40 MG PO TABS: 60.0000 mg | ORAL_TABLET | Freq: Two times a day (BID) | ORAL | Status: DC

## 2015-03-13 MED ORDER — CARVEDILOL 6.25 MG PO TABS: 12.5000 mg | ORAL_TABLET | Freq: Two times a day (BID) | ORAL | Status: DC

## 2015-03-13 MED ORDER — TIZANIDINE HCL 4 MG PO TABS: 4.0000 mg | ORAL_TABLET | Freq: Three times a day (TID) | ORAL | Status: DC | PRN

## 2015-03-13 MED ORDER — ACCU-CHEK SOFTCLIX LANCET DEV MISC: Status: DC

## 2015-03-13 MED ORDER — METFORMIN HCL 500 MG PO TABS: 500.0000 mg | ORAL_TABLET | Freq: Two times a day (BID) | ORAL | Status: DC

## 2015-03-13 NOTE — Patient Instructions (Signed)
Diabetes Mellitus and Food It is important for you to manage your blood sugar (glucose) level. Your blood glucose level can be greatly affected by what you eat. Eating healthier foods in the appropriate amounts throughout the day at about the same time each day will help you control your blood glucose level. It can also help slow or prevent worsening of your diabetes mellitus. Healthy eating may even help you improve the level of your blood pressure and reach or maintain a healthy weight.  General recommendations for healthful eating and cooking habits include:  Eating meals and snacks regularly. Avoid going long periods of time without eating to lose weight.  Eating a diet that consists mainly of plant-based foods, such as fruits, vegetables, nuts, legumes, and whole grains.  Using low-heat cooking methods, such as baking, instead of high-heat cooking methods, such as deep frying. Work with your dietitian to make sure you understand how to use the Nutrition Facts information on food labels. HOW CAN FOOD AFFECT ME? Carbohydrates Carbohydrates affect your blood glucose level more than any other type of food. Your dietitian will help you determine how many carbohydrates to eat at each meal and teach you how to count carbohydrates. Counting carbohydrates is important to keep your blood glucose at a healthy level, especially if you are using insulin or taking certain medicines for diabetes mellitus. Alcohol Alcohol can cause sudden decreases in blood glucose (hypoglycemia), especially if you use insulin or take certain medicines for diabetes mellitus. Hypoglycemia can be a life-threatening condition. Symptoms of hypoglycemia (sleepiness, dizziness, and disorientation) are similar to symptoms of having too much alcohol.  If your health care provider has given you approval to drink alcohol, do so in moderation and use the following guidelines:  Women should not have more than one drink per day, and men  should not have more than two drinks per day. One drink is equal to:  12 oz of beer.  5 oz of wine.  1 oz of hard liquor.  Do not drink on an empty stomach.  Keep yourself hydrated. Have water, diet soda, or unsweetened iced tea.  Regular soda, juice, and other mixers might contain a lot of carbohydrates and should be counted. WHAT FOODS ARE NOT RECOMMENDED? As you make food choices, it is important to remember that all foods are not the same. Some foods have fewer nutrients per serving than other foods, even though they might have the same number of calories or carbohydrates. It is difficult to get your body what it needs when you eat foods with fewer nutrients. Examples of foods that you should avoid that are high in calories and carbohydrates but low in nutrients include:  Trans fats (most processed foods list trans fats on the Nutrition Facts label).  Regular soda.  Juice.  Candy.  Sweets, such as cake, pie, doughnuts, and cookies.  Fried foods. WHAT FOODS CAN I EAT? Eat nutrient-rich foods, which will nourish your body and keep you healthy. The food you should eat also will depend on several factors, including:  The calories you need.  The medicines you take.  Your weight.  Your blood glucose level.  Your blood pressure level.  Your cholesterol level. You should eat a variety of foods, including:  Protein.  Lean cuts of meat.  Proteins low in saturated fats, such as fish, egg whites, and beans. Avoid processed meats.  Fruits and vegetables.  Fruits and vegetables that may help control blood glucose levels, such as apples, mangoes, and   yams.  Dairy products.  Choose fat-free or low-fat dairy products, such as milk, yogurt, and cheese.  Grains, bread, pasta, and rice.  Choose whole grain products, such as multigrain bread, whole oats, and brown rice. These foods may help control blood pressure.  Fats.  Foods containing healthful fats, such as nuts,  avocado, olive oil, canola oil, and fish. DOES EVERYONE WITH DIABETES MELLITUS HAVE THE SAME MEAL PLAN? Because every person with diabetes mellitus is different, there is not one meal plan that works for everyone. It is very important that you meet with a dietitian who will help you create a meal plan that is just right for you.   This information is not intended to replace advice given to you by your health care provider. Make sure you discuss any questions you have with your health care provider.   Document Released: 10/23/2004 Document Revised: 02/16/2014 Document Reviewed: 12/23/2012 Elsevier Interactive Patient Education 2016 [employer]  

## 2015-03-13 NOTE — Progress Notes (Signed)
Patient's here for f/up of BP.   Patient states he's having pain back on left side  and feet swelling.  Patient declines flu vaccine today.

## 2015-03-14 DIAGNOSIS — R32 Unspecified urinary incontinence: Secondary | ICD-10-CM | POA: Insufficient documentation

## 2015-03-14 HISTORY — DX: Unspecified urinary incontinence: R32

## 2015-03-14 NOTE — Progress Notes (Signed)
Subjective:    Patient ID: [name], male    DOB: 28-Sep-1950, 65 y.o.   MRN: [id number]  HPI [name] is a 65 year old male with a history of HTN, CHF (EF 50-55%), COPD, CVA with left hemiparesis, PVD, with a recent hospitalization at [facility] from 02/06/15 through 02/13/15 for recurrent right MCA territory infarct thought to be secondary to known right ICA near occlusion. He had presented with left sided weakness and did not receive thrombolytic therapy due to late presentation .Seen by neurology, recommendations were for cerebral angiogram and stenting.Interventional radiology was consulted, patient underwent catheter guided cerebral angiogram with stenting of R  ICA supraclinoid segment on 12/30. Recommendation was for dual antiplatelet therapy with ASA and Plavix for at least 6 months.  Interval History: His blood pressure which was previously on low side at last visit is normal today and home blood pressure logs reveals values in the 120-160/70-88. He complains of persisting bilateral pedal edema. Scheduled to see vascular surgeon on 03/21/15. He also complains of a one-day history of left-sided lower back pain which started when he was trying to do some heavy lifting and pain does not radiate. He suffers from urinary incontinence for which he uses depends.  Past Medical History  Diagnosis Date  . Depression   . Hypertension   . Hyperlipidemia   . Myocardial infarction ([place]) 2000  . CHF (congestive heart failure) ([place])   . COPD (chronic obstructive pulmonary disease) ([place])   . Peripheral vascular disease, unspecified ([place])     08/20/10 doppler: increase in right ABI post-op. Left ABI stable. S/P bi-fem bypass surgery  . GERD (gastroesophageal reflux disease)   . Asthma   . Anxiety   . History of DVT of lower extremity   . CAD (coronary artery disease) 05/27/10    Cath: severe single vessell CAD left cx midportion obtuse marginal 2 to 3.  . Shortness of  breath   . Pneumonia 04/04/2012  . Arthritis   . OSA on CPAP   . Hyperlipemia   . COPD (chronic obstructive pulmonary disease) ([place])   . Alcohol abuse     H/O  . Tobacco abuse   . Orbital fracture ([place]) 12/2012  . Stroke ([place])   . Diabetes mellitus, type 2 ([place]) 03/13/2015    Past Surgical History  Procedure Laterality Date  . Femoral bypass  08/19/10    Right Fem-Pop  . Aortogram w/ ptca  09/292003  . Eye surgery    . Back surgery    . Total knee arthroplasty    . Cholecystectomy    . Cardiac catheterization  05/27/10    severe CAD left cx  . Abdoninal ao angio & bifem angio  05/27/10    Patent graft, occluded bil stents with no retrograde flow into the hypogastric arteries. 100% occl left ant. tibial artery, 70% to 80% to 100% stenosis right superficial fem artery above adductor canal. 100 % occl right ant. tibial vessell  . Esophagogastric fundoplication    . Tee without cardioversion N/A 08/29/2014    Procedure: TRANSESOPHAGEAL ECHOCARDIOGRAM (TEE);  Surgeon: Josue Hector, MD;  Location: [facility] ENDOSCOPY;  Service: Cardiovascular;  Laterality: N/A;  . Peripheral vascular catheterization N/A 12/10/2014    Procedure: Abdominal Aortogram;  Surgeon: Angelia Mould, MD;  Location: [facility] CV LAB;  Service: Cardiovascular;  Laterality: N/A;  . Radiology with anesthesia N/A 02/08/2015    Procedure: RADIOLOGY WITH ANESTHESIA;  Surgeon: Luanne Bras, MD;  Location: [facility];  Service: Radiology;  Laterality: N/A;    Allergies  Allergen Reactions  . Darvocet [Propoxyphene N-Acetaminophen] Hives  . Haldol [Haloperidol Decanoate] Hives  . Acetaminophen Nausea Only    Upset stomach, tolerates Hydrocodone/APAP if taken with food Upset stomach, tolerates Hydrocodone/APAP if taken with food  . Norco [Hydrocodone-Acetaminophen] Nausea And Vomiting    Tolerates if taken with food    Social History   Social History  . Marital Status: Single    Spouse Name: N/A  . Number of  Children: N/A  . Years of Education: N/A   Occupational History  . Not on file.   Social History Main Topics  . Smoking status: Current Every Day Smoker -- 1.00 packs/day for 50 years    Types: Cigars, Cigarettes    Start date: 02/09/1961  . Smokeless tobacco: Former Systems developer    Types: Chew     Comment: 2 ppd, full flavor  . Alcohol Use: No     Comment: 10/24/14 no drinking for 2 weeks  . Drug Use: No  . Sexual Activity: Yes   Other Topics Concern  . Not on file   Social History Narrative   Recovering alcoholic    Current Outpatient Prescriptions on File Prior to Visit  Medication Sig Dispense Refill  . albuterol (PROVENTIL HFA;VENTOLIN HFA) 108 (90 BASE) MCG/ACT inhaler Inhale 2 puffs into the lungs every 6 (six) hours as needed for wheezing or shortness of breath. 1 Inhaler 3  . albuterol (PROVENTIL) (2.5 MG/3ML) 0.083% nebulizer solution Take 3 mLs (2.5 mg total) by nebulization 2 (two) times daily as needed for wheezing or shortness of breath. 75 mL 0  . allopurinol (ZYLOPRIM) 300 MG tablet Take 1 tablet (300 mg total) by mouth daily. 30 tablet 2  . aspirin 325 MG EC tablet Take 1 tablet (325 mg total) by mouth daily. (Patient taking differently: Take 325 mg by mouth every 6 (six) hours as needed for pain. ) 30 tablet 0  . atorvastatin (LIPITOR) 40 MG tablet Take 2 tablets (80 mg total) by mouth daily at 6 PM.    . Besifloxacin HCl (BESIVANCE) 0.6 % SUSP Place 1 drop into the left eye 2 (two) times daily.    . clopidogrel (PLAVIX) 75 MG tablet Take 1 tablet (75 mg total) by mouth daily. 30 tablet 2  . colchicine 0.6 MG tablet Take 2 tabs (1.2 mg) at the onset of a gout attack, may repeat 1 tablet (0.6 mg) in one hour if symptoms persist 30 tablet 1  . gabapentin (NEURONTIN) 300 MG capsule Take 1 capsule (300 mg total) by mouth 3 (three) times daily.    [name] lactulose (CHRONULAC) 10 GM/15ML solution Take 15 mLs (10 g total) by mouth 2 (two) times daily as needed for mild constipation.  946 mL 2  . Misc. Devices (ROLLATOR) MISC 1 each by Does not apply route daily. 1 each 0  . mometasone-formoterol (DULERA) 200-5 MCG/ACT AERO Inhale 2 puffs into the lungs 2 (two) times daily. 13 g 3  . pantoprazole (PROTONIX) 40 MG tablet Take 1 tablet (40 mg total) by mouth daily.    [name] tiotropium (SPIRIVA) 18 MCG inhalation capsule Place 1 capsule (18 mcg total) into inhaler and inhale daily. 30 capsule 3  . traMADol (ULTRAM) 50 MG tablet Take 1 tablet (50 mg total) by mouth every 8 (eight) hours as needed. 20 tablet 0   No current facility-administered medications on file prior to visit.  Review of Systems Constitutional: Negative for activity change and appetite change.  HENT: Negative for sinus pressure and sore throat.   Eyes: Negative for visual disturbance.  Respiratory: Negative for cough, chest tightness and shortness of breath.   Cardiovascular: Positive for leg swelling. Negative for chest pain.  Gastrointestinal: Negative for abdominal pain, diarrhea and abdominal distention.  Endocrine: Negative.   Genitourinary: Negative for dysuria.  positive for urinary incontinence Musculoskeletal: positive for left sided back pain Skin: Negative for rash.  Allergic/Immunologic: Negative.   Neurological: Positive for weakness (left-sided). Negative for light-headedness and numbness.       Memory problems   Psychiatric/Behavioral: Negative for suicidal ideas and dysphoric mood.     Objective: Filed Vitals:   03/13/15 1050  BP: 145/77  Pulse: 89  Temp: 97.5 F (36.4 C)  TempSrc: Oral  Resp: 18  Height: 6' (1.829 m)  Weight: 248 lb (112.492 kg)  SpO2: 98%      Physical Exam Constitutional: He is oriented to person, place, and time. He appears well-developed and well-nourished.  Neck: No JVD present. No tracheal deviation present.  Cardiovascular: Normal rate, regular rhythm and normal heart sounds.   No murmur heard. Unable to palpate dorsalis pedis pulses due to  edema  Pulmonary/Chest: Effort normal and breath sounds normal. No respiratory distress. He has no wheezes. He exhibits no tenderness.  Abdominal: Soft. Bowel sounds are normal. He exhibits no mass. There is no tenderness.  Musculoskeletal: Normal range of motion. He exhibits edema (bilateral nonpitting pedal edema up to lower third of leg) and mild erythema of lower third of right leg. Mild tenderness on palpation of left side of back.  Neurological: He is alert and oriented to person, place, and time.  Skin: Skin is warm and dry.  Psychiatric: He has a normal mood and affect.         Assessment & Plan:  1. Peripheral vascular disease ([place]) Currently with pedal edema and claudication pains. Scheduled to see his vascular surgeon for redo right femoral to below knee popliteal bypass..  2. Essential hypertension Reveals hypotension has resolved. Continue antihypertensives.  3. Idiopathic gout, unspecified chronicity, unspecified site No acute flare  - allopurinol (ZYLOPRIM) 300 MG tablet; Take 1 tablet (300 mg total) by mouth daily.  Dispense: 30 tablet; Refill: 2 - colchicine 0.6 MG tablet; Take 2 tabs (1.2 mg) at the onset of a gout attack, may repeat 1 tablet (0.6 mg) in one hour if symptoms persist  Dispense: 30 tablet; Refill: 1  4. Chronic diastolic congestive heart failure (HCC) EF of 16-10%, grade 1 diastolic dysfunction from 2-D echo of 07/2014  5. Chronic obstructive pulmonary disease, unspecified COPD type ([place]) No acute exacerbation  6. Bilateral edema of lower extremity Advised to elevate feet and use compression stockings if he is able to. Low-sodium diet. Increase Lasix to 60 mg twice daily.  7. Cerebral infarction due to other mechanism [facility]) Dual antiplatelet therapy (with ASA and Plavix) for at least 6 months as per Neurology He is high risk for falls and will benefit from increased hours but have been informed by his PCP  worker that the states only approves 80  hours a month and so we will proceed with trying to get him into a skilled nursing facility as he was prematurely discharged at his request - Misc. Devices (ROLLATOR) MISC; 1 each by Does not apply route daily.  Dispense: 1 each; Refill: 0  8. Intracranial carotid stenosis, bilateral Status post placement of  stent to right ICA Advised to call his interventional radiologist for a follow-up appointment. - Misc. Devices (ROLLATOR) MISC; 1 each by Does not apply route daily.  Dispense: 1 each; Refill: 0  9. Lower back muscle spasm Refilled tizanidine and advised to apply heat.  10. Type 2 diabetes mellitus Commenced on metformin. I'll review blood sugar log of his next visit.

## 2015-03-15 ENCOUNTER — Encounter: Payer: Self-pay | Admitting: Vascular Surgery

## 2015-03-18 ENCOUNTER — Other Ambulatory Visit: Payer: Self-pay | Admitting: Family Medicine

## 2015-03-18 DIAGNOSIS — J449 Chronic obstructive pulmonary disease, unspecified: Secondary | ICD-10-CM

## 2015-03-18 DIAGNOSIS — M1000 Idiopathic gout, unspecified site: Secondary | ICD-10-CM

## 2015-03-18 MED ORDER — ALBUTEROL SULFATE HFA 108 (90 BASE) MCG/ACT IN AERS: 2.0000 | INHALATION_SPRAY | Freq: Four times a day (QID) | RESPIRATORY_TRACT | Status: DC | PRN

## 2015-03-18 MED ORDER — COLCHICINE 0.6 MG PO TABS: ORAL_TABLET | ORAL | Status: DC

## 2015-03-20 ENCOUNTER — Encounter: Payer: Self-pay | Admitting: Vascular Surgery

## 2015-03-20 ENCOUNTER — Ambulatory Visit (INDEPENDENT_AMBULATORY_CARE_PROVIDER_SITE_OTHER): Payer: Medicare Other | Admitting: Vascular Surgery

## 2015-03-20 VITALS — BP 121/71 | HR 84 | Temp 97.7°F | Ht 72.0 in | Wt 243.0 lb

## 2015-03-20 DIAGNOSIS — I739 Peripheral vascular disease, unspecified: Secondary | ICD-10-CM

## 2015-03-20 NOTE — Progress Notes (Signed)
HISTORY AND PHYSICAL     CC:  F/u PAD  Referring Provider:  Arnoldo Morale, MD  HPI: This is a 65 y.o. male with a complicated PAD hx.  He underwent aortobifemoral bypass grafting in the late 90's.  He had a right femoral to above knee popliteal artery bypass with prosthetic graft in 2012.  He had a left femoral to popliteal bypass graft done elsewhere.  Both femoropopliteal grafts are chronically occluded.  He does have a hx of tobacco abuse and has no desire to quit.   On December 10, 2014, he underwent an aortogram, which revealed his aortobifemoral bypass graft was patent.  On the right side, there was diffuse disease of the common femoral artery below the right limb of his aortofemoral bypass graft. The superficial femoral artery was occluded at its origin with reconstitution of the below knee popliteal artery. The deep femoral artery had diffuse disease with a tight stenosis proximally. There was two-vessel runoff on the right.  On the left side, the common femoral and deep femoral arteries were patent. The superficial femoral artery was occluded at its origin with reconstitution of the popliteal artery at the level of the knee. There was two-vessel runoff on the left.  Back in December, he was being considered for a redo right fem pop bypass graft, but did have multiple medical problems including obesity, COPD, and significant cardiac hx.  He underwent stress testing on December 04, 2014 and was cleared for surgery.    His pain in December was rest pain in the right foot and wanted to proceed with bypass grafting.  However, he did have a stroke with left sided weakness and his surgery was deferred.  He presents today for follow up.  He states that he is not having much pain in his right foot at this time.  He now gets occasional right foot pain, but it is bearable.    His caregiver is with him today and states that his blood pressure is low today (120/70) and normally runs in the 140's.  He is  asymptomatic.  He denies any dizziness.   She states that his voice is also different today, but there is no change in his speech pattern.  He does have a cough today.   He states that allopurinol and colchicine have been added to his medication regimen for gout, which has flared up since last seeing Dr. Scot Dock.    He is on a statin for cholesterol management.  He is on a beta blocker for hypertension.  He is on Metformin for diabetes management.  He is on a daily aspirin.  Past Medical History  Diagnosis Date  . Depression   . Hypertension   . Hyperlipidemia   . Myocardial infarction ([place]) 2000  . CHF (congestive heart failure) ([place])   . COPD (chronic obstructive pulmonary disease) ([place])   . Peripheral vascular disease, unspecified ([place])     08/20/10 doppler: increase in right ABI post-op. Left ABI stable. S/P bi-fem bypass surgery  . GERD (gastroesophageal reflux disease)   . Asthma   . Anxiety   . History of DVT of lower extremity   . CAD (coronary artery disease) 05/27/10    Cath: severe single vessell CAD left cx midportion obtuse marginal 2 to 3.  . Shortness of breath   . Pneumonia 04/04/2012  . Arthritis   . OSA on CPAP   . Hyperlipemia   . COPD (chronic obstructive pulmonary disease) ([place])   . Alcohol  abuse     H/O  . Tobacco abuse   . Orbital fracture ([place] ) 12/2012  . Stroke ([place])   . Diabetes mellitus, type 2 ([place]) 03/13/2015    Past Surgical History  Procedure Laterality Date  . Femoral bypass  08/19/10    Right Fem-Pop  . Aortogram w/ ptca  09/292003  . Eye surgery    . Back surgery    . Total knee arthroplasty    . Cholecystectomy    . Cardiac catheterization  05/27/10    severe CAD left cx  . Abdoninal ao angio & bifem angio  05/27/10    Patent graft, occluded bil stents with no retrograde flow into the hypogastric arteries. 100% occl left ant. tibial artery, 70% to 80% to 100% stenosis right superficial fem artery above adductor canal. 100 % occl right ant.  tibial vessell  . Esophagogastric fundoplication    . Tee without cardioversion N/A 08/29/2014    Procedure: TRANSESOPHAGEAL ECHOCARDIOGRAM (TEE);  Surgeon: Josue Hector, MD;  Location: [facility] ENDOSCOPY;  Service: Cardiovascular;  Laterality: N/A;  . Peripheral vascular catheterization N/A 12/10/2014    Procedure: Abdominal Aortogram;  Surgeon: Angelia Mould, MD;  Location: [facility] LAB;  Service: Cardiovascular;  Laterality: N/A;  . Radiology with anesthesia N/A 02/08/2015    Procedure: RADIOLOGY WITH ANESTHESIA;  Surgeon: Luanne Bras, MD;  Location: [facility];  Service: Radiology;  Laterality: N/A;    Allergies  Allergen Reactions  . Darvocet [Propoxyphene N-Acetaminophen] Hives  . Haldol [Haloperidol Decanoate] Hives  . Acetaminophen Nausea Only    Upset stomach, tolerates Hydrocodone/APAP if taken with food Upset stomach, tolerates Hydrocodone/APAP if taken with food  . Norco [Hydrocodone-Acetaminophen] Nausea And Vomiting    Tolerates if taken with food    Current Outpatient Prescriptions  Medication Sig Dispense Refill  . albuterol (PROVENTIL HFA;VENTOLIN HFA) 108 (90 Base) MCG/ACT inhaler Inhale 2 puffs into the lungs every 6 (six) hours as needed for wheezing or shortness of breath. 1 Inhaler 3  . albuterol (PROVENTIL) (2.5 MG/3ML) 0.083% nebulizer solution Take 3 mLs (2.5 mg total) by nebulization 2 (two) times daily as needed for wheezing or shortness of breath. 75 mL 0  . allopurinol (ZYLOPRIM) 300 MG tablet Take 1 tablet (300 mg total) by mouth daily. 30 tablet 2  . aspirin 325 MG EC tablet Take 1 tablet (325 mg total) by mouth daily. (Patient taking differently: Take 325 mg by mouth every 6 (six) hours as needed for pain. ) 30 tablet 0  . atorvastatin (LIPITOR) 40 MG tablet Take 2 tablets (80 mg total) by mouth daily at 6 PM.    . Besifloxacin HCl (BESIVANCE) 0.6 % SUSP Place 1 drop into the left eye 2 (two) times daily.    . Blood Glucose Monitoring Suppl  (ACCU-CHEK AVIVA) device Use 3 times daily before meals. 1 each 0  . carvedilol (COREG) 6.25 MG tablet Take 2 tablets (12.5 mg total) by mouth 2 (two) times daily with a meal. 30 tablet 2  . clopidogrel (PLAVIX) 75 MG tablet Take 1 tablet (75 mg total) by mouth daily. 30 tablet 2  . colchicine 0.6 MG tablet Take 2 tabs (1.2 mg) at the onset of a gout attack, may repeat 1 tablet (0.6 mg) in one hour if symptoms persist 30 tablet 2  . furosemide (LASIX) 40 MG tablet Take 1.5 tablets (60 mg total) by mouth 2 (two) times daily. 90 tablet 2  . gabapentin (NEURONTIN) 300 MG capsule Take  1 capsule (300 mg total) by mouth 3 (three) times daily.    [name] glucose blood (ACCU-CHEK AVIVA) test strip Use 3 times daily before meals as directed. 100 each 12  . lactulose (CHRONULAC) 10 GM/15ML solution Take 15 mLs (10 g total) by mouth 2 (two) times daily as needed for mild constipation. 946 mL 2  . Lancet Devices (ACCU-CHEK SOFTCLIX) lancets Use 3 times daily before meals 1 each 5  . metFORMIN (GLUCOPHAGE) 500 MG tablet Take 1 tablet (500 mg total) by mouth 2 (two) times daily with a meal. 180 tablet 3  . Misc. Devices (ROLLATOR) MISC 1 each by Does not apply route daily. 1 each 0  . mometasone-formoterol (DULERA) 200-5 MCG/ACT AERO Inhale 2 puffs into the lungs 2 (two) times daily. 13 g 3  . pantoprazole (PROTONIX) 40 MG tablet Take 1 tablet (40 mg total) by mouth daily.    [name] tiotropium (SPIRIVA) 18 MCG inhalation capsule Place 1 capsule (18 mcg total) into inhaler and inhale daily. 30 capsule 3  . tiZANidine (ZANAFLEX) 4 MG tablet Take 1 tablet (4 mg total) by mouth every 8 (eight) hours as needed for muscle spasms. 90 tablet 1  . traMADol (ULTRAM) 50 MG tablet Take 1 tablet (50 mg total) by mouth every 8 (eight) hours as needed. 20 tablet 0   No current facility-administered medications for this visit.    Family History  Problem Relation Age of Onset  . Heart attack Mother   . Cirrhosis Father   . Heart  failure Mother   . Heart failure Brother   . Cancer Brother   . Hypertension Neg Hx     UNKNOWN  . Stroke Neg Hx     UNKNOWN    Social History   Social History  . Marital Status: Single    Spouse Name: N/A  . Number of Children: N/A  . Years of Education: N/A   Occupational History  . Not on file.   Social History Main Topics  . Smoking status: Current Every Day Smoker -- 1.00 packs/day for 50 years    Types: Cigars, Cigarettes    Start date: 02/09/1961  . Smokeless tobacco: Former Systems developer    Types: Chew     Comment: 2 ppd, full flavor  . Alcohol Use: No     Comment: 10/24/14 no drinking for 2 weeks  . Drug Use: No  . Sexual Activity: Yes   Other Topics Concern  . Not on file   Social History Narrative   Recovering alcoholic     REVIEW OF SYSTEMS:   '[X]'$  denotes positive finding, '[ ]'$  denotes negative finding Cardiac  Comments:  Chest pain or chest pressure:    Shortness of breath upon exertion:    Short of breath when lying flat:    Irregular heart rhythm:        Vascular    Pain in calf, thigh, or hip brought on by ambulation:    Pain in right foot x Occasional pain in right foot  Blood clot in your veins: x Hx of DVT in the past  Leg swelling:  x Recent gout      Pulmonary    Oxygen at home:    Productive cough:     COPD x   Wheezing:         Neurologic     weakness in left arm or leg:  x Stroke at the end of December  Sudden numbness in arms or legs:  Sudden onset of difficulty speaking or slurred speech:    Temporary loss of vision in one eye:     Problems with dizziness:         Gastrointestinal    Blood in stool:     Vomited blood:         Genitourinary    Burning when urinating:     Blood in urine:        Psychiatric    Major depression:         Hematologic    Bleeding problems:    Problems with blood clotting too easily:        Skin    Rashes or ulcers:        Constitutional    Fever or chills:      PHYSICAL  EXAMINATION:  Filed Vitals:   03/20/15 1328 03/20/15 1330  BP: 128/68 121/71  Pulse: 84   Temp: 97.7 F (36.5 C)    Body mass index is 32.95 kg/(m^2).  General:  WDWN in NAD; vital signs documented above Gait: slow gait with cane HENT: WNL, normocephalic Pulmonary: normal non-labored breathing , without Rales, rhonchi,  wheezing Cardiac: regular HR, without  Murmurs, rubs or gallops; without carotid bruits Abdomen: soft, NT, no masses Skin: without rashes Vascular Exam/Pulses:  Right Left  Radial 2+ (normal) 2+ (normal)  Femoral 2+ (normal) 2+ (normal)  DP Unable to palpate  Unable to palpate   PT Unable to palpate  Unable to palpate    Extremities: without ischemic changes, without Gangrene , without cellulitis; without open wounds;  2+ edema BLE  Musculoskeletal: no muscle wasting or atrophy  Neurologic: A&O X 3;  5/5 RUE; 4/5 LUE; mild weakness of LLE Psychiatric:  The pt has Normal affect.   Non-Invasive Vascular Imaging:   None today  Pt meds includes: Statin:  Yes.   Beta Blocker:  Yes.   Aspirin:  Yes.   ACEI:  No. ARB:  No. Other Antiplatelet/Anticoagulant:  Yes.   Plavix   ASSESSMENT/PLAN:: 65 y.o. male with PAD and hx of aortobifemoral bypass and BLE femoral to popliteal bypasses, which are chronically occluded.   -the pt's rest pain has improved from December when last seen by Dr. Scot Dock.  He does not have any non healing wounds on his feet. -will defer bypass grafting in the right leg since his pain has improved.  He will contact us sooner if he develops worsening pain or non healing wounds on his feet. -we will see him back in 6 months with ABI's. -his caregiver did mention his voice change since yesterday-after further questioning, his speech has not changed and he does not have dysarthria.  It is most likely weather related or related to his cough.  His caregiver will call his MD immediately should this change.  He continues to have good grip  bilaterally -caregiver concerned about BP today (120/70).  He is asymptomatic.  She will continue to record his BP daily and if there are significant changes, she will contact his PCP    Leontine Locket, PA-C [facility] [phone]  Clinic MD:  Pt seen and examined in conjunction with Dr. Scot Dock

## 2015-03-20 NOTE — Addendum Note (Signed)
Addended by: Dorthula Rue L on: 03/20/2015 05:35 PM   Modules accepted: Orders

## 2015-03-22 ENCOUNTER — Ambulatory Visit (HOSPITAL_COMMUNITY): Payer: Medicare Other | Attending: Interventional Radiology

## 2015-04-17 ENCOUNTER — Ambulatory Visit: Payer: Self-pay | Admitting: Neurology

## 2015-04-18 ENCOUNTER — Encounter: Payer: Self-pay | Admitting: Neurology

## 2015-06-14 ENCOUNTER — Telehealth: Payer: Self-pay | Admitting: Family Medicine

## 2015-06-21 ENCOUNTER — Encounter: Payer: Self-pay | Admitting: Family Medicine

## 2015-06-21 ENCOUNTER — Ambulatory Visit: Payer: Medicare Other | Attending: Family Medicine | Admitting: Family Medicine

## 2015-06-21 VITALS — BP 166/82 | HR 65 | Temp 97.5°F | Resp 16 | Ht 72.0 in | Wt 246.8 lb

## 2015-06-21 DIAGNOSIS — J449 Chronic obstructive pulmonary disease, unspecified: Secondary | ICD-10-CM | POA: Diagnosis not present

## 2015-06-21 DIAGNOSIS — Z7984 Long term (current) use of oral hypoglycemic drugs: Secondary | ICD-10-CM | POA: Insufficient documentation

## 2015-06-21 DIAGNOSIS — M62838 Other muscle spasm: Secondary | ICD-10-CM | POA: Diagnosis not present

## 2015-06-21 DIAGNOSIS — R21 Rash and other nonspecific skin eruption: Secondary | ICD-10-CM

## 2015-06-21 DIAGNOSIS — K5909 Other constipation: Secondary | ICD-10-CM

## 2015-06-21 DIAGNOSIS — I639 Cerebral infarction, unspecified: Secondary | ICD-10-CM | POA: Diagnosis not present

## 2015-06-21 DIAGNOSIS — E785 Hyperlipidemia, unspecified: Secondary | ICD-10-CM

## 2015-06-21 DIAGNOSIS — K219 Gastro-esophageal reflux disease without esophagitis: Secondary | ICD-10-CM | POA: Diagnosis not present

## 2015-06-21 DIAGNOSIS — Z720 Tobacco use: Secondary | ICD-10-CM | POA: Diagnosis not present

## 2015-06-21 DIAGNOSIS — I5032 Chronic diastolic (congestive) heart failure: Secondary | ICD-10-CM

## 2015-06-21 DIAGNOSIS — Z79899 Other long term (current) drug therapy: Secondary | ICD-10-CM | POA: Diagnosis not present

## 2015-06-21 DIAGNOSIS — I10 Essential (primary) hypertension: Secondary | ICD-10-CM

## 2015-06-21 DIAGNOSIS — K5900 Constipation, unspecified: Secondary | ICD-10-CM | POA: Diagnosis not present

## 2015-06-21 DIAGNOSIS — E119 Type 2 diabetes mellitus without complications: Secondary | ICD-10-CM

## 2015-06-21 DIAGNOSIS — Z7982 Long term (current) use of aspirin: Secondary | ICD-10-CM | POA: Insufficient documentation

## 2015-06-21 DIAGNOSIS — F17200 Nicotine dependence, unspecified, uncomplicated: Secondary | ICD-10-CM

## 2015-06-21 DIAGNOSIS — M1000 Idiopathic gout, unspecified site: Secondary | ICD-10-CM | POA: Diagnosis not present

## 2015-06-21 LAB — GLUCOSE, POCT (MANUAL RESULT ENTRY): POC Glucose: 197 mg/dl — AB (ref 70–99)

## 2015-06-21 MED ORDER — TRIAMCINOLONE ACETONIDE 0.1 % EX CREA: 1.0000 "application " | TOPICAL_CREAM | Freq: Two times a day (BID) | CUTANEOUS | Status: DC

## 2015-06-21 MED ORDER — LACTULOSE 10 GM/15ML PO SOLN: 10.0000 g | Freq: Two times a day (BID) | ORAL | Status: DC | PRN

## 2015-06-21 MED ORDER — MOMETASONE FURO-FORMOTEROL FUM 200-5 MCG/ACT IN AERO: 2.0000 | INHALATION_SPRAY | Freq: Two times a day (BID) | RESPIRATORY_TRACT | Status: DC

## 2015-06-21 MED ORDER — CLOPIDOGREL BISULFATE 75 MG PO TABS: 75.0000 mg | ORAL_TABLET | Freq: Every day | ORAL | Status: DC

## 2015-06-21 MED ORDER — PANTOPRAZOLE SODIUM 40 MG PO TBEC: 40.0000 mg | DELAYED_RELEASE_TABLET | Freq: Every day | ORAL | Status: DC

## 2015-06-21 MED ORDER — ATORVASTATIN CALCIUM 80 MG PO TABS: 80.0000 mg | ORAL_TABLET | Freq: Every day | ORAL | Status: DC

## 2015-06-21 MED ORDER — TIOTROPIUM BROMIDE MONOHYDRATE 18 MCG IN CAPS: 18.0000 ug | ORAL_CAPSULE | Freq: Every day | RESPIRATORY_TRACT | Status: DC

## 2015-06-21 MED ORDER — TIZANIDINE HCL 4 MG PO TABS: 4.0000 mg | ORAL_TABLET | Freq: Three times a day (TID) | ORAL | Status: DC | PRN

## 2015-06-21 MED ORDER — FUROSEMIDE 40 MG PO TABS: 60.0000 mg | ORAL_TABLET | Freq: Two times a day (BID) | ORAL | Status: DC

## 2015-06-21 MED ORDER — ALBUTEROL SULFATE HFA 108 (90 BASE) MCG/ACT IN AERS: 2.0000 | INHALATION_SPRAY | Freq: Four times a day (QID) | RESPIRATORY_TRACT | Status: DC | PRN

## 2015-06-21 MED ORDER — COLCHICINE 0.6 MG PO TABS: ORAL_TABLET | ORAL | Status: DC

## 2015-06-21 MED ORDER — ACETAMINOPHEN-CODEINE #3 300-30 MG PO TABS: 1.0000 | ORAL_TABLET | Freq: Three times a day (TID) | ORAL | Status: DC | PRN

## 2015-06-21 MED ORDER — CARVEDILOL 12.5 MG PO TABS: 12.5000 mg | ORAL_TABLET | Freq: Two times a day (BID) | ORAL | Status: DC

## 2015-06-21 MED ORDER — PREDNISONE 20 MG PO TABS: 40.0000 mg | ORAL_TABLET | Freq: Every day | ORAL | Status: DC

## 2015-06-21 NOTE — Progress Notes (Signed)
Subjective:  Patient ID: [name], male    DOB: 25-Jun-1950  Age: 65 y.o. MRN: [id number]  CC: Diabetes and Follow-up   HPI [name] is a 65 year old male with a history of HTN, CHF (EF 50-55%), COPD, CVA with mild residual left hemiparesis, PVD here for a follow up visit.  He stopped Metformin due to nausea and currently does not take any medications for his Diabetes. Complains of pain and swelling in his right foot; states it hurts to bear weight on it. Denies fever; pain is throbbing and he is unable to differentiate it from the chronic pain in his legs as he does have claudication pains. His Peripheral vascular disease is managed by Ve[facility] V[facility] specialist of [facility] and he has his next appointment in 09/2015 with plans for an ABI  Also complains of scaly non pruritic rash on forearms for a few weeks; he also has the rash in his scalp. Would like to be referred to Podiatry for foot care.  Also informs me he will need his home care agency to be contacted as his hours are about to be cut. Outpatient Prescriptions Prior to Visit  Medication Sig Dispense Refill  . albuterol (PROVENTIL) (2.5 MG/3ML) 0.083% nebulizer solution Take 3 mLs (2.5 mg total) by nebulization 2 (two) times daily as needed for wheezing or shortness of breath. 75 mL 0  . allopurinol (ZYLOPRIM) 300 MG tablet Take 1 tablet (300 mg total) by mouth daily. 30 tablet 2  . aspirin 325 MG EC tablet Take 1 tablet (325 mg total) by mouth daily. (Patient taking differently: Take 325 mg by mouth every 6 (six) hours as needed for pain. ) 30 tablet 0  . Besifloxacin HCl (BESIVANCE) 0.6 % SUSP Place 1 drop into the left eye 2 (two) times daily.    . Blood Glucose Monitoring Suppl (ACCU-CHEK AVIVA) device Use 3 times daily before meals. 1 each 0  . gabapentin (NEURONTIN) 300 MG capsule Take 1 capsule (300 mg total) by mouth 3 (three) times daily.    [name] glucose blood (ACCU-CHEK AVIVA) test strip Use 3 times daily  before meals as directed. 100 each 12  . Lancet Devices (ACCU-CHEK SOFTCLIX) lancets Use 3 times daily before meals 1 each 5  . Misc. Devices (ROLLATOR) MISC 1 each by Does not apply route daily. 1 each 0  . albuterol (PROVENTIL HFA;VENTOLIN HFA) 108 (90 Base) MCG/ACT inhaler Inhale 2 puffs into the lungs every 6 (six) hours as needed for wheezing or shortness of breath. 1 Inhaler 3  . atorvastatin (LIPITOR) 40 MG tablet Take 2 tablets (80 mg total) by mouth daily at 6 PM.    . carvedilol (COREG) 6.25 MG tablet Take 2 tablets (12.5 mg total) by mouth 2 (two) times daily with a meal. 30 tablet 2  . clopidogrel (PLAVIX) 75 MG tablet Take 1 tablet (75 mg total) by mouth daily. 30 tablet 2  . colchicine 0.6 MG tablet Take 2 tabs (1.2 mg) at the onset of a gout attack, may repeat 1 tablet (0.6 mg) in one hour if symptoms persist 30 tablet 2  . furosemide (LASIX) 40 MG tablet Take 1.5 tablets (60 mg total) by mouth 2 (two) times daily. 90 tablet 2  . lactulose (CHRONULAC) 10 GM/15ML solution Take 15 mLs (10 g total) by mouth 2 (two) times daily as needed for mild constipation. 946 mL 2  . metFORMIN (GLUCOPHAGE) 500 MG tablet Take 1 tablet (500 mg total)  by mouth 2 (two) times daily with a meal. 180 tablet 3  . mometasone-formoterol (DULERA) 200-5 MCG/ACT AERO Inhale 2 puffs into the lungs 2 (two) times daily. 13 g 3  . pantoprazole (PROTONIX) 40 MG tablet Take 1 tablet (40 mg total) by mouth daily.    [name] tiotropium (SPIRIVA) 18 MCG inhalation capsule Place 1 capsule (18 mcg total) into inhaler and inhale daily. 30 capsule 3  . tiZANidine (ZANAFLEX) 4 MG tablet Take 1 tablet (4 mg total) by mouth every 8 (eight) hours as needed for muscle spasms. 90 tablet 1  . traMADol (ULTRAM) 50 MG tablet Take 1 tablet (50 mg total) by mouth every 8 (eight) hours as needed. 20 tablet 0   No facility-administered medications prior to visit.    ROS Review of Systems Constitutional: Negative for activity change and  appetite change.  HENT: Negative for sinus pressure and sore throat.   Eyes: Negative for visual disturbance.  Respiratory: Negative for cough, chest tightness and shortness of breath.   Cardiovascular: Positive for leg swelling and claudication pains . Negative for chest pain.  Gastrointestinal: Negative for abdominal pain, diarrhea and abdominal distention.  Endocrine: Negative.   Genitourinary: Negative for dysuria.  positive for urinary incontinence Musculoskeletal: see hpi Skin: see hpi Allergic/Immunologic: Negative.   Neurological: Positive for weakness (left-sided). Negative for light-headedness and numbness.       Memory problems Objective:  BP 166/82 mmHg  Pulse 65  Temp(Src) 97.5 F (36.4 C) (Oral)  Resp 16  Ht 6' (1.829 m)  Wt 246 lb 12.8 oz (111.948 kg)  BMI 33.46 kg/m2  SpO2 97%  BP/Weight 06/21/2015 03/13/1939 08/12/812  Systolic BP 481 856 314  Diastolic BP 82 71 77  Wt. (Lbs) 246.8 243 248  BMI 33.46 32.95 33.63      Physical Exam Constitutional: He is oriented to person, place, and time. He appears well-developed and well-nourished.  Neck: No JVD present. No tracheal deviation present.  Cardiovascular: Normal rate, regular rhythm and normal heart sounds.   No murmur heard. Unable to palpate dorsalis pedis pulses due to edema  Pulmonary/Chest: Effort normal and breath sounds normal. No respiratory distress. He has no wheezes. He exhibits no tenderness.  Abdominal: Soft. Bowel sounds are normal. He exhibits no mass. There is no tenderness.  Musculoskeletal: Normal range of motion. He exhibits edema (bilateral nonpitting pedal edema up to lower third of leg) and Erythema of right foot with associated severe tenderness to palpation Mild tenderness on palpation of left side of back.  Neurological: He is alert and oriented to person, place, and time.  Skin: Scaly rash on extensor aspect of both fore arms Psychiatric: He has a normal mood and affect.     Assessment & Plan:   1. Type 2 diabetes mellitus without complication, without long-term current use of insulin (HCC) A1c 6.7 in 01/2015 Stopped Metformin due to GI intolerance Fasting labs and A1c at next visit; if a1c is elevated i will place him on low dose Glipizide - Glucose (CBG) - Ambulatory referral to Podiatry  2. Chronic obstructive pulmonary disease, unspecified COPD type (HCC) Stable - tiotropium (SPIRIVA) 18 MCG inhalation capsule; Place 1 capsule (18 mcg total) into inhaler and inhale daily.  Dispense: 30 capsule; Refill: 3 - mometasone-formoterol (DULERA) 200-5 MCG/ACT AERO; Inhale 2 puffs into the lungs 2 (two) times daily.  Dispense: 13 g; Refill: 3 - albuterol (PROVENTIL HFA;VENTOLIN HFA) 108 (90 Base) MCG/ACT inhaler; Inhale 2 puffs into the lungs every 6 (  six) hours as needed for wheezing or shortness of breath.  Dispense: 1 Inhaler; Refill: 3  3. Essential hypertension Controlled  4. Chronic diastolic congestive heart failure (HCC) EF 50-55% from 07/2014 Euvolemic - carvedilol (COREG) 12.5 MG tablet; Take 1 tablet (12.5 mg total) by mouth 2 (two) times daily with a meal.  Dispense: 60 tablet; Refill: 3 - furosemide (LASIX) 40 MG tablet; Take 1.5 tablets (60 mg total) by mouth 2 (two) times daily.  Dispense: 90 tablet; Refill: 3  5. HLD (hyperlipidemia) Uncontrolled with LDL of 150; Goal is LDL of <70 Lipid panel at next visit - atorvastatin (LIPITOR) 80 MG tablet; Take 1 tablet (80 mg total) by mouth daily at 6 PM.  Dispense: 30 tablet; Refill: 3  6. Tobacco use disorder He is not ready to quit  7. Idiopathic gout, unspecified chronicity, unspecified site Acute flare in right foot Discussed avoiding Gout triggers - colchicine 0.6 MG tablet; Take 2 tabs (1.2 mg) at the onset of a gout attack, may repeat 1 tablet (0.6 mg) in one hour if symptoms persist  Dispense: 30 tablet; Refill: 2 - predniSONE (DELTASONE) 20 MG tablet; Take 2 tablets (40 mg total) by  mouth daily with breakfast.  Dispense: 10 tablet; Refill: 0  8. Other constipation - lactulose (CHRONULAC) 10 GM/15ML solution; Take 15 mLs (10 g total) by mouth 2 (two) times daily as needed for mild constipation.  Dispense: 946 mL; Refill: 2  9. Muscle spasm - tiZANidine (ZANAFLEX) 4 MG tablet; Take 1 tablet (4 mg total) by mouth every 8 (eight) hours as needed for muscle spasms.  Dispense: 90 tablet; Refill: 3  10. Acute CVA (cerebrovascular accident) ([place]) - clopidogrel (PLAVIX) 75 MG tablet; Take 1 tablet (75 mg total) by mouth daily.  Dispense: 30 tablet; Refill: 2  11. Gastroesophageal reflux disease without esophagitis - pantoprazole (PROTONIX) 40 MG tablet; Take 1 tablet (40 mg total) by mouth daily.  Dispense: 30 tablet; Refill: 3  12. Rash Likely psoriasis Will place on topical steroids.  Meds ordered this encounter  Medications  . acetaminophen-codeine (TYLENOL #3) 300-30 MG tablet    Sig: Take 1 tablet by mouth every 8 (eight) hours as needed for moderate pain.    Dispense:  60 tablet    Refill:  1  . lactulose (CHRONULAC) 10 GM/15ML solution    Sig: Take 15 mLs (10 g total) by mouth 2 (two) times daily as needed for mild constipation.    Dispense:  946 mL    Refill:  2  . carvedilol (COREG) 12.5 MG tablet    Sig: Take 1 tablet (12.5 mg total) by mouth 2 (two) times daily with a meal.    Dispense:  60 tablet    Refill:  3  . tiotropium (SPIRIVA) 18 MCG inhalation capsule    Sig: Place 1 capsule (18 mcg total) into inhaler and inhale daily.    Dispense:  30 capsule    Refill:  3  . mometasone-formoterol (DULERA) 200-5 MCG/ACT AERO    Sig: Inhale 2 puffs into the lungs 2 (two) times daily.    Dispense:  13 g    Refill:  3  . albuterol (PROVENTIL HFA;VENTOLIN HFA) 108 (90 Base) MCG/ACT inhaler    Sig: Inhale 2 puffs into the lungs every 6 (six) hours as needed for wheezing or shortness of breath.    Dispense:  1 Inhaler    Refill:  3  . tiZANidine (ZANAFLEX) 4 MG  tablet  Sig: Take 1 tablet (4 mg total) by mouth every 8 (eight) hours as needed for muscle spasms.    Dispense:  90 tablet    Refill:  3  . colchicine 0.6 MG tablet    Sig: Take 2 tabs (1.2 mg) at the onset of a gout attack, may repeat 1 tablet (0.6 mg) in one hour if symptoms persist    Dispense:  30 tablet    Refill:  2  . atorvastatin (LIPITOR) 80 MG tablet    Sig: Take 1 tablet (80 mg total) by mouth daily at 6 PM.    Dispense:  30 tablet    Refill:  3  . clopidogrel (PLAVIX) 75 MG tablet    Sig: Take 1 tablet (75 mg total) by mouth daily.    Dispense:  30 tablet    Refill:  2  . furosemide (LASIX) 40 MG tablet    Sig: Take 1.5 tablets (60 mg total) by mouth 2 (two) times daily.    Dispense:  90 tablet    Refill:  3  . pantoprazole (PROTONIX) 40 MG tablet    Sig: Take 1 tablet (40 mg total) by mouth daily.    Dispense:  30 tablet    Refill:  3  . predniSONE (DELTASONE) 20 MG tablet    Sig: Take 2 tablets (40 mg total) by mouth daily with breakfast.    Dispense:  10 tablet    Refill:  0    Follow-up: Return in about 2 weeks (around 07/05/2015) for Follow-up of hypertension, diabetes and fasting labs.   Arnoldo Morale MD

## 2015-06-21 NOTE — Patient Instructions (Signed)
Diabetes Mellitus and Food It is important for you to manage your blood sugar (glucose) level. Your blood glucose level can be greatly affected by what you eat. Eating healthier foods in the appropriate amounts throughout the day at about the same time each day will help you control your blood glucose level. It can also help slow or prevent worsening of your diabetes mellitus. Healthy eating may even help you improve the level of your blood pressure and reach or maintain a healthy weight.  General recommendations for healthful eating and cooking habits include:  Eating meals and snacks regularly. Avoid going long periods of time without eating to lose weight.  Eating a diet that consists mainly of plant-based foods, such as fruits, vegetables, nuts, legumes, and whole grains.  Using low-heat cooking methods, such as baking, instead of high-heat cooking methods, such as deep frying. Work with your dietitian to make sure you understand how to use the Nutrition Facts information on food labels. HOW CAN FOOD AFFECT ME? Carbohydrates Carbohydrates affect your blood glucose level more than any other type of food. Your dietitian will help you determine how many carbohydrates to eat at each meal and teach you how to count carbohydrates. Counting carbohydrates is important to keep your blood glucose at a healthy level, especially if you are using insulin or taking certain medicines for diabetes mellitus. Alcohol Alcohol can cause sudden decreases in blood glucose (hypoglycemia), especially if you use insulin or take certain medicines for diabetes mellitus. Hypoglycemia can be a life-threatening condition. Symptoms of hypoglycemia (sleepiness, dizziness, and disorientation) are similar to symptoms of having too much alcohol.  If your health care provider has given you approval to drink alcohol, do so in moderation and use the following guidelines:  Women should not have more than one drink per day, and men  should not have more than two drinks per day. One drink is equal to:  12 oz of beer.  5 oz of wine.  1 oz of hard liquor.  Do not drink on an empty stomach.  Keep yourself hydrated. Have water, diet soda, or unsweetened iced tea.  Regular soda, juice, and other mixers might contain a lot of carbohydrates and should be counted. WHAT FOODS ARE NOT RECOMMENDED? As you make food choices, it is important to remember that all foods are not the same. Some foods have fewer nutrients per serving than other foods, even though they might have the same number of calories or carbohydrates. It is difficult to get your body what it needs when you eat foods with fewer nutrients. Examples of foods that you should avoid that are high in calories and carbohydrates but low in nutrients include:  Trans fats (most processed foods list trans fats on the Nutrition Facts label).  Regular soda.  Juice.  Candy.  Sweets, such as cake, pie, doughnuts, and cookies.  Fried foods. WHAT FOODS CAN I EAT? Eat nutrient-rich foods, which will nourish your body and keep you healthy. The food you should eat also will depend on several factors, including:  The calories you need.  The medicines you take.  Your weight.  Your blood glucose level.  Your blood pressure level.  Your cholesterol level. You should eat a variety of foods, including:  Protein.  Lean cuts of meat.  Proteins low in saturated fats, such as fish, egg whites, and beans. Avoid processed meats.  Fruits and vegetables.  Fruits and vegetables that may help control blood glucose levels, such as apples, mangoes, and   yams.  Dairy products.  Choose fat-free or low-fat dairy products, such as milk, yogurt, and cheese.  Grains, bread, pasta, and rice.  Choose whole grain products, such as multigrain bread, whole oats, and brown rice. These foods may help control blood pressure.  Fats.  Foods containing healthful fats, such as nuts,  avocado, olive oil, canola oil, and fish. DOES EVERYONE WITH DIABETES MELLITUS HAVE THE SAME MEAL PLAN? Because every person with diabetes mellitus is different, there is not one meal plan that works for everyone. It is very important that you meet with a dietitian who will help you create a meal plan that is just right for you.   This information is not intended to replace advice given to you by your health care provider. Make sure you discuss any questions you have with your health care provider.   Document Released: 10/23/2004 Document Revised: 02/16/2014 Document Reviewed: 12/23/2012 Elsevier Interactive Patient Education 2016 [employer]  

## 2015-06-21 NOTE — Progress Notes (Signed)
Patient's here for f/up DM.  Patient reports pain in both legs and hip, rate pain 10/10 in feet and hip rated 6/10.  Patient requesting med refill.  Patient stated that he was declined continuation of services from home health services.

## 2015-06-24 ENCOUNTER — Telehealth: Payer: Self-pay

## 2015-06-24 NOTE — Telephone Encounter (Signed)
Message from Dr [name] noting that the patient is concerned that his home care services are being discontinued.    This CM spoke to the patient and he stated that he is worried that his aide services are going to be stopped. He said that he is alone and he needs help with personal care, meal preparation, homemaking and medication reminders. He gave this CM verbal approval to contact Kennyth Lose at [facility] # [phone] to discuss the status of his PCS hours. He currently has an aide for 2 hours x 7 days a week   Call placed to Dorita Sciara with [facility] at the above noted #.  She stated that the patient is keeping his 92 hours/ month of PCS services and she explained this to the patient on Friday, 06/21/15. She noted that she just went through mediation for him to keep the 87 hours/month and they will be going through mediation before a "judge" in another 4-6 weeks.  She noted that she is advocating for an  increase in hours for the patient due to his increased need for assistance. She stated that she will complete another form - 3051 and will deliver the form to [facility] for Dr [name] to review and sign.   Call placed to the patient again an informed him that as her Ellison Carwin, he will continue with his 61 hours of PCS services/month and she is currently advocating for more hours for him. He said that he is still concerned that he will loose his hours. This CM encouraged him to call this CM back if he has any questions/ concerns going forward. He was agreeable to scheduling a follow up appointment with Dr [name] as she recommended that he return to the clinic around 07/05/15. An appointment was scheduled for 07/12/15 @ 0930.  He was appreciative of the appointment and stated that he understands that he needs to be fasting for blood work that morning.   Update provided to Dr [name].

## 2015-07-12 ENCOUNTER — Ambulatory Visit: Payer: Medicare Other | Attending: Family Medicine | Admitting: Family Medicine

## 2015-07-12 ENCOUNTER — Encounter: Payer: Self-pay | Admitting: Family Medicine

## 2015-07-12 VITALS — BP 153/80 | HR 98 | Temp 97.9°F | Wt 247.6 lb

## 2015-07-12 DIAGNOSIS — E1149 Type 2 diabetes mellitus with other diabetic neurological complication: Secondary | ICD-10-CM

## 2015-07-12 DIAGNOSIS — F0280 Dementia in other diseases classified elsewhere without behavioral disturbance: Secondary | ICD-10-CM

## 2015-07-12 DIAGNOSIS — I10 Essential (primary) hypertension: Secondary | ICD-10-CM

## 2015-07-12 DIAGNOSIS — L308 Other specified dermatitis: Secondary | ICD-10-CM

## 2015-07-12 DIAGNOSIS — L408 Other psoriasis: Secondary | ICD-10-CM | POA: Diagnosis not present

## 2015-07-12 DIAGNOSIS — I739 Peripheral vascular disease, unspecified: Secondary | ICD-10-CM

## 2015-07-12 HISTORY — DX: Dementia in other diseases classified elsewhere, unspecified severity, without behavioral disturbance, psychotic disturbance, mood disturbance, and anxiety: F02.80

## 2015-07-12 LAB — GLUCOSE, POCT (MANUAL RESULT ENTRY): POC Glucose: 148 mg/dl — AB (ref 70–99)

## 2015-07-12 MED ORDER — MEMANTINE HCL 10 MG PO TABS: 10.0000 mg | ORAL_TABLET | Freq: Two times a day (BID) | ORAL | Status: DC

## 2015-07-12 MED ORDER — GABAPENTIN 300 MG PO CAPS: 600.0000 mg | ORAL_CAPSULE | Freq: Three times a day (TID) | ORAL | Status: DC

## 2015-07-12 NOTE — Progress Notes (Signed)
Subjective:  Patient ID: [name], male    DOB: 06-11-1950  Age: 65 y.o. MRN: [id number]  CC: No chief complaint on file.   HPI [name] is a 65 year old male with a history of HTN, CHF (EF 50-55%), COPD, CVA with mild residual left hemiparesis, PVD here for a follow up visit.  He stopped Metformin due to nausea and currently does not take any medications for his Diabetes. Complains of pain and swelling in his right foot, feels like he is stepping on broken glass; states it hurts to bear weight on it. Denies fever; pain is throbbing and he is unable to differentiate it from the chronic pain in his legs as he does have claudication pains. He has a history of gout and has taken his colchicine and allopurinol with no relief in symptoms. His Peripheral vascular disease is managed by Ve[facility] and V[facility] specialist of [facility] and he has his next appointment in 09/2015 with plans for an ABI  Also complains of scaly non pruritic rash on forearms for a few weeks for which he received triamcinolone with no improvement in symptoms. He complains of worsening memory problems and gets confused about whether he took his medicines or not. He does have a home health aide which he let go but states another one will be be starting on Monday and he is needing some forms completed for the agency.  Outpatient Prescriptions Prior to Visit  Medication Sig Dispense Refill  . acetaminophen-codeine (TYLENOL #3) 300-30 MG tablet Take 1 tablet by mouth every 8 (eight) hours as needed for moderate pain. 60 tablet 1  . albuterol (PROVENTIL HFA;VENTOLIN HFA) 108 (90 Base) MCG/ACT inhaler Inhale 2 puffs into the lungs every 6 (six) hours as needed for wheezing or shortness of breath. 1 Inhaler 3  . albuterol (PROVENTIL) (2.5 MG/3ML) 0.083% nebulizer solution Take 3 mLs (2.5 mg total) by nebulization 2 (two) times daily as needed for wheezing or shortness of breath. 75 mL 0  . allopurinol (ZYLOPRIM) 300 MG  tablet Take 1 tablet (300 mg total) by mouth daily. 30 tablet 2  . atorvastatin (LIPITOR) 80 MG tablet Take 1 tablet (80 mg total) by mouth daily at 6 PM. 30 tablet 3  . Blood Glucose Monitoring Suppl (ACCU-CHEK AVIVA) device Use 3 times daily before meals. 1 each 0  . carvedilol (COREG) 12.5 MG tablet Take 1 tablet (12.5 mg total) by mouth 2 (two) times daily with a meal. 60 tablet 3  . clopidogrel (PLAVIX) 75 MG tablet Take 1 tablet (75 mg total) by mouth daily. 30 tablet 2  . colchicine 0.6 MG tablet Take 2 tabs (1.2 mg) at the onset of a gout attack, may repeat 1 tablet (0.6 mg) in one hour if symptoms persist 30 tablet 2  . furosemide (LASIX) 40 MG tablet Take 1.5 tablets (60 mg total) by mouth 2 (two) times daily. 90 tablet 3  . glucose blood (ACCU-CHEK AVIVA) test strip Use 3 times daily before meals as directed. 100 each 12  . lactulose (CHRONULAC) 10 GM/15ML solution Take 15 mLs (10 g total) by mouth 2 (two) times daily as needed for mild constipation. 946 mL 2  . Lancet Devices (ACCU-CHEK SOFTCLIX) lancets Use 3 times daily before meals 1 each 5  . Misc. Devices (ROLLATOR) MISC 1 each by Does not apply route daily. 1 each 0  . mometasone-formoterol (DULERA) 200-5 MCG/ACT AERO Inhale 2 puffs into the lungs 2 (two) times daily. [place]  g 3  . pantoprazole (PROTONIX) 40 MG tablet Take 1 tablet (40 mg total) by mouth daily. 30 tablet 3  . tiotropium (SPIRIVA) 18 MCG inhalation capsule Place 1 capsule (18 mcg total) into inhaler and inhale daily. 30 capsule 3  . tiZANidine (ZANAFLEX) 4 MG tablet Take 1 tablet (4 mg total) by mouth every 8 (eight) hours as needed for muscle spasms. 90 tablet 3  . triamcinolone cream (KENALOG) 0.1 % Apply 1 application topically 2 (two) times daily. 30 g 1  . gabapentin (NEURONTIN) 300 MG capsule Take 1 capsule (300 mg total) by mouth 3 (three) times daily.    [name] aspirin 325 MG EC tablet Take 1 tablet (325 mg total) by mouth daily. (Patient not taking: Reported on  07/12/2015) 30 tablet 0  . Besifloxacin HCl (BESIVANCE) 0.6 % SUSP Place 1 drop into the left eye 2 (two) times daily. Reported on 07/12/2015    . predniSONE (DELTASONE) 20 MG tablet Take 2 tablets (40 mg total) by mouth daily with breakfast. 10 tablet 0   No facility-administered medications prior to visit.    ROS Review of Systems Constitutional: Negative for activity change and appetite change.  HENT: Negative for sinus pressure and sore throat.   Eyes: Negative for visual disturbance.  Respiratory: Negative for cough, chest tightness and shortness of breath.   Cardiovascular: Positive for leg swelling and claudication pains . Negative for chest pain.  Gastrointestinal: Negative for abdominal pain, diarrhea and abdominal distention.  Endocrine: Negative.   Genitourinary: Negative for dysuria.  positive for urinary incontinence Musculoskeletal: see hpi Skin: see hpi Allergic/Immunologic: Negative.   Neurological: Positive for weakness (left-sided). Negative for light-headedness and positive for  numbness.       Memory loss  Objective:  BP 153/80 mmHg  Pulse 98  Temp(Src) 97.9 F (36.6 C)  Wt 247 lb 9.6 oz (112.311 kg)  SpO2 98%  BP/Weight 07/12/2015 9/47/6546 5/0/3546  Systolic BP 568 127 517  Diastolic BP 80 82 71  Wt. (Lbs) 247.6 246.8 243  BMI 33.57 33.46 32.95      Physical Exam Constitutional: He is oriented to person, place, and time. He appears well-developed and well-nourished.  Neck: No JVD present. No tracheal deviation present.  Cardiovascular: Normal rate, regular rhythm and normal heart sounds.   No murmur heard. Unable to palpate dorsalis pedis pulses due to edema  Pulmonary/Chest: Effort normal and breath sounds normal. No respiratory distress. He has no wheezes. He exhibits no tenderness.  Abdominal: Soft. Bowel sounds are normal. He exhibits no mass. There is no tenderness.  Musculoskeletal: Normal range of motion. He exhibits edema (bilateral nonpitting  pedal edema R>L up to lower third of leg) and Erythema of right foot with associated severe tenderness to palpation Mild tenderness on palpation of left side of back.  Neurological: He is alert and oriented to person, place, and time.  Skin: Scaly psoriasiform rash on extensor aspect of both fore arms Psychiatric: He has a normal mood and affect.   Assessment & Plan:   1. Type 2 diabetes mellitus with other neurologic complication, without long-term current use of insulin (HCC) Diet controlled with A1c of 6.7 from 01/2015 (unable to tolerate metformin) A1c and lipid panel next visit Complains of neuropathy and so I have increased his dose of Neurontin. - Glucose (CBG) - gabapentin (NEURONTIN) 300 MG capsule; Take 2 capsules (600 mg total) by mouth 3 (three) times daily.  Dispense: 180 capsule; Refill: 3  2. Psoriasiform dermatitis Continue  triamcinolone cream. - Ambulatory referral to Dermatology  3. Essential hypertension Uncontrolled If still elevated at his next visit we'll increase the dose of his antihypertensive  4. Dementia due to another medical condition, without behavioral disturbance Likely secondary to her stroke Commenced on Namenda - memantine (NAMENDA) 10 MG tablet; Take 1 tablet (10 mg total) by mouth 2 (two) times daily.  Dispense: 60 tablet; Refill: 2  5. Peripheral vascular disease ([place]) Currently followed by vascular surgery. This could explain pedal edema. Advised to elevate feet and comply with low-sodium diet.   Meds ordered this encounter  Medications  . gabapentin (NEURONTIN) 300 MG capsule    Sig: Take 2 capsules (600 mg total) by mouth 3 (three) times daily.    Dispense:  180 capsule    Refill:  3  . memantine (NAMENDA) 10 MG tablet    Sig: Take 1 tablet (10 mg total) by mouth 2 (two) times daily.    Dispense:  60 tablet    Refill:  2    Follow-up: Return in about 6 weeks (around 08/23/2015) for Follow-up of hypertension.   Arnoldo Morale  MD

## 2015-07-12 NOTE — Patient Instructions (Signed)

## 2015-07-16 ENCOUNTER — Telehealth: Payer: Self-pay

## 2015-07-16 NOTE — Telephone Encounter (Signed)
Call received from [facility], the patient's PCA.  She was with the patient and she had the phone on speaker. The patient identified himself and confirmed his birth date.  [name]  explained that Dorita Sciara, [place], is coordinating the pCS  referrals for the agency, but she [name]) is helping to advocate for the patient and collect the needed information for the PCS hearing on 07/30/15. She noted that some additional information is needed on the [facility] application requesting  increased hours and she will bring the form to the [facility] for Dr [name] to review/update as needed. [name]'s contact # [phone].

## 2015-07-16 NOTE — Telephone Encounter (Signed)
[name] with [facility] dropped of the updated [facility] request form for Dr Johna Sheriff signature.

## 2015-07-16 NOTE — Telephone Encounter (Signed)
Call received from [facility] with [facility] # [phone] requesting the [facility] address and fax #.  She stated that she is working on the documentation for [employer] services.

## 2015-07-17 NOTE — Progress Notes (Signed)
Patient's completed [place] form faxed to [employer] [phone]).

## 2015-07-24 ENCOUNTER — Telehealth: Payer: Self-pay | Admitting: Family Medicine

## 2015-07-24 DIAGNOSIS — I69354 Hemiplegia and hemiparesis following cerebral infarction affecting left non-dominant side: Secondary | ICD-10-CM

## 2015-07-24 DIAGNOSIS — I739 Peripheral vascular disease, unspecified: Secondary | ICD-10-CM

## 2015-07-24 DIAGNOSIS — E1149 Type 2 diabetes mellitus with other diabetic neurological complication: Secondary | ICD-10-CM

## 2015-07-24 NOTE — Telephone Encounter (Signed)
[name] pt. Caregiver called requesting Rx for the following:  Shower chair Diabetic shoes Insure   Please f/u

## 2015-07-25 ENCOUNTER — Telehealth: Payer: Self-pay

## 2015-07-25 NOTE — Telephone Encounter (Signed)
Call received from [name] [phone]) patient's personal care aide. She indicated patient was informed today that he lost his [place]. She indicated she would be bringing by another [place] form that Dr. [name] will need to complete. She also indicated supporting documentation needing showing why patient needing [place].

## 2015-07-26 NOTE — Telephone Encounter (Signed)
Prescription for a shower chair was faxed along with clinic notes and demographics to [facility].

## 2015-07-26 NOTE — Telephone Encounter (Signed)
Writer spoke with caregiver to update her on the shower chair- [facility] will be contacting them, MD placed referral to podiatry for diabetic shoes.  Patient doesn't qualify for Ensure.  Lyn stated understanding.

## 2015-07-26 NOTE — Telephone Encounter (Signed)
Referred to podiatry for diabetic shoes. He does not meet criteria for prescription of ensure. Prescription for shower chair done

## 2015-07-30 ENCOUNTER — Telehealth: Payer: Self-pay

## 2015-07-30 NOTE — Telephone Encounter (Signed)
A new PCS referral was faxed to [facility]. Fax # [phone].

## 2015-08-09 ENCOUNTER — Other Ambulatory Visit: Payer: Self-pay | Admitting: Pharmacist

## 2015-08-09 DIAGNOSIS — R21 Rash and other nonspecific skin eruption: Secondary | ICD-10-CM

## 2015-08-09 MED ORDER — TRIAMCINOLONE ACETONIDE 0.1 % EX CREA: 1.0000 "application " | TOPICAL_CREAM | Freq: Two times a day (BID) | CUTANEOUS | Status: DC

## 2015-08-12 ENCOUNTER — Ambulatory Visit: Payer: Self-pay

## 2015-08-12 ENCOUNTER — Ambulatory Visit (INDEPENDENT_AMBULATORY_CARE_PROVIDER_SITE_OTHER): Payer: Medicare Other | Admitting: Podiatry

## 2015-08-12 ENCOUNTER — Encounter: Payer: Self-pay | Admitting: Podiatry

## 2015-08-12 VITALS — BP 172/73 | HR 85 | Resp 16

## 2015-08-12 DIAGNOSIS — E119 Type 2 diabetes mellitus without complications: Secondary | ICD-10-CM | POA: Diagnosis not present

## 2015-08-12 DIAGNOSIS — Q828 Other specified congenital malformations of skin: Secondary | ICD-10-CM

## 2015-08-12 DIAGNOSIS — M79672 Pain in left foot: Principal | ICD-10-CM

## 2015-08-12 DIAGNOSIS — E1140 Type 2 diabetes mellitus with diabetic neuropathy, unspecified: Secondary | ICD-10-CM | POA: Diagnosis not present

## 2015-08-12 DIAGNOSIS — E1149 Type 2 diabetes mellitus with other diabetic neurological complication: Secondary | ICD-10-CM

## 2015-08-12 DIAGNOSIS — M79671 Pain in right foot: Secondary | ICD-10-CM

## 2015-08-12 DIAGNOSIS — R600 Localized edema: Secondary | ICD-10-CM

## 2015-08-12 DIAGNOSIS — E1151 Type 2 diabetes mellitus with diabetic peripheral angiopathy without gangrene: Secondary | ICD-10-CM | POA: Diagnosis not present

## 2015-08-12 NOTE — Progress Notes (Signed)
Subjective:     Patient ID: [name], male   DOB: Dec 07, 1950, 65 y.o.   MRN: [id number]  HPI patient presents as a very poor health individual with edema in both feet and discoloration. He also has some digital deformities and develops keratotic lesions around his hallux with long-term diabetes that he's very concerned about   Review of Systems  All other systems reviewed and are negative.      Objective:   Physical Exam  Constitutional: He is oriented to person, place, and time.  Musculoskeletal: Normal range of motion.  Neurological: He is oriented to person, place, and time.  Skin: Skin is warm and dry.  Nursing note and vitals reviewed.  vascular status moderately diminished as is neurological sharp dull and vibratory. Patient's found to have edema in both feet with redness and irritation and elongated nailbeds. I noted keratotic lesions around the medial side of the hallux and first metatarsal bilateral that are localized in nature and tender when palpated. Patient is noted to have slow digital perfusion and is well oriented 3     Assessment:     Poor health individual with lesion formation and also at risk diabetes with edema    Plan:     H&P and condition reviewed and I do think he would be best and diabetic shoes and reviewed this with him. Patient will be approved for diabetic shoes to try to control his edema and try to allow him to wear shoe gear and hopefully stay ambulatory

## 2015-08-12 NOTE — Progress Notes (Signed)
   Subjective:    Patient ID: [name], male    DOB: 1950/11/24, 65 y.o.   MRN: [id number]  HPI Chief Complaint  Patient presents with  . Diabetes    Diabetic foot exam - swelling bilateral for several years, PCP put on lasix, can't get compression stockings on, sometimes they turn purple, tightness, "no one can seem to help me"      Review of Systems  Constitutional: Positive for fatigue.  HENT: Positive for hearing loss and sinus pressure.   Respiratory: Positive for apnea, cough, shortness of breath and wheezing.   Cardiovascular: Positive for leg swelling.  Musculoskeletal: Positive for myalgias.  Skin: Positive for rash.  Hematological: Bruises/bleeds easily.  Psychiatric/Behavioral: The patient is nervous/anxious.   All other systems reviewed and are negative.      Objective:   Physical Exam        Assessment & Plan:

## 2015-08-19 ENCOUNTER — Other Ambulatory Visit: Payer: Self-pay | Admitting: Pharmacist

## 2015-08-19 DIAGNOSIS — I639 Cerebral infarction, unspecified: Secondary | ICD-10-CM

## 2015-08-19 DIAGNOSIS — M1000 Idiopathic gout, unspecified site: Secondary | ICD-10-CM

## 2015-08-19 MED ORDER — CLOPIDOGREL BISULFATE 75 MG PO TABS: 75.0000 mg | ORAL_TABLET | Freq: Every day | ORAL | Status: DC

## 2015-08-19 MED ORDER — ALLOPURINOL 300 MG PO TABS: 300.0000 mg | ORAL_TABLET | Freq: Every day | ORAL | Status: DC

## 2015-08-21 ENCOUNTER — Other Ambulatory Visit: Payer: Self-pay | Admitting: Pharmacist

## 2015-08-21 DIAGNOSIS — K219 Gastro-esophageal reflux disease without esophagitis: Secondary | ICD-10-CM

## 2015-08-21 MED ORDER — PANTOPRAZOLE SODIUM 40 MG PO TBEC: 40.0000 mg | DELAYED_RELEASE_TABLET | Freq: Every day | ORAL | Status: DC

## 2015-08-28 ENCOUNTER — Telehealth: Payer: Self-pay | Admitting: Family Medicine

## 2015-08-28 NOTE — Telephone Encounter (Signed)
[name] pt. Aide called requesting to speak with the nurse regarding pt. Medications. She stated that [facility] has been trying to get in contact with pt. PCP so that he can Get medications refilled. Please f/u

## 2015-08-28 NOTE — Telephone Encounter (Signed)
Left voicemail advised to callback with additional information.

## 2015-09-12 ENCOUNTER — Other Ambulatory Visit: Payer: Self-pay | Admitting: Pharmacist

## 2015-09-12 ENCOUNTER — Telehealth: Payer: Self-pay | Admitting: Family Medicine

## 2015-09-12 ENCOUNTER — Encounter: Payer: Self-pay | Admitting: Family

## 2015-09-12 DIAGNOSIS — E785 Hyperlipidemia, unspecified: Secondary | ICD-10-CM

## 2015-09-12 MED ORDER — ATORVASTATIN CALCIUM 80 MG PO TABS: 80.0000 mg | ORAL_TABLET | Freq: Every day | ORAL | 3 refills | Status: DC

## 2015-09-12 NOTE — Telephone Encounter (Signed)
Medication Refill: Tylenol #3

## 2015-09-13 ENCOUNTER — Other Ambulatory Visit: Payer: Self-pay | Admitting: Pharmacist

## 2015-09-13 DIAGNOSIS — K219 Gastro-esophageal reflux disease without esophagitis: Secondary | ICD-10-CM

## 2015-09-13 DIAGNOSIS — R21 Rash and other nonspecific skin eruption: Secondary | ICD-10-CM

## 2015-09-13 DIAGNOSIS — F0280 Dementia in other diseases classified elsewhere without behavioral disturbance: Secondary | ICD-10-CM

## 2015-09-13 DIAGNOSIS — I5032 Chronic diastolic (congestive) heart failure: Secondary | ICD-10-CM

## 2015-09-13 MED ORDER — TRIAMCINOLONE ACETONIDE 0.1 % EX CREA: 1.0000 "application " | TOPICAL_CREAM | Freq: Two times a day (BID) | CUTANEOUS | 0 refills | Status: DC

## 2015-09-13 MED ORDER — MEMANTINE HCL 10 MG PO TABS: 10.0000 mg | ORAL_TABLET | Freq: Two times a day (BID) | ORAL | 2 refills | Status: DC

## 2015-09-13 MED ORDER — CARVEDILOL 12.5 MG PO TABS: 12.5000 mg | ORAL_TABLET | Freq: Two times a day (BID) | ORAL | 3 refills | Status: DC

## 2015-09-13 MED ORDER — PANTOPRAZOLE SODIUM 40 MG PO TBEC: 40.0000 mg | DELAYED_RELEASE_TABLET | Freq: Every day | ORAL | 3 refills | Status: DC

## 2015-09-17 ENCOUNTER — Other Ambulatory Visit: Payer: Self-pay | Admitting: Pharmacist

## 2015-09-17 ENCOUNTER — Telehealth: Payer: Self-pay

## 2015-09-17 MED ORDER — ACCU-CHEK SOFTCLIX LANCETS MISC: 5 refills | Status: DC

## 2015-09-17 MED ORDER — ACETAMINOPHEN-CODEINE #3 300-30 MG PO TABS: 1.0000 | ORAL_TABLET | Freq: Three times a day (TID) | ORAL | 1 refills | Status: DC | PRN

## 2015-09-17 NOTE — Telephone Encounter (Signed)
Notified patient that prescription is ready for pick up.

## 2015-09-17 NOTE — Telephone Encounter (Signed)
Ready for pick up

## 2015-09-18 ENCOUNTER — Ambulatory Visit: Payer: Medicare Other | Admitting: Family

## 2015-09-18 ENCOUNTER — Ambulatory Visit (HOSPITAL_COMMUNITY): Payer: Medicare Other | Attending: Vascular Surgery

## 2015-09-25 ENCOUNTER — Telehealth: Payer: Self-pay | Admitting: Family Medicine

## 2015-09-25 NOTE — Telephone Encounter (Signed)
[name] is needing quantity of pills for prescription for tylenol 3. Please follow up.

## 2015-09-26 NOTE — Telephone Encounter (Signed)
60

## 2015-09-27 ENCOUNTER — Ambulatory Visit: Payer: Medicare Other | Attending: Family Medicine | Admitting: Family Medicine

## 2015-09-27 ENCOUNTER — Encounter: Payer: Self-pay | Admitting: Family Medicine

## 2015-09-27 DIAGNOSIS — M1000 Idiopathic gout, unspecified site: Secondary | ICD-10-CM

## 2015-09-27 DIAGNOSIS — E785 Hyperlipidemia, unspecified: Secondary | ICD-10-CM | POA: Diagnosis not present

## 2015-09-27 DIAGNOSIS — Z8673 Personal history of transient ischemic attack (TIA), and cerebral infarction without residual deficits: Secondary | ICD-10-CM | POA: Diagnosis not present

## 2015-09-27 DIAGNOSIS — I5032 Chronic diastolic (congestive) heart failure: Secondary | ICD-10-CM | POA: Diagnosis not present

## 2015-09-27 DIAGNOSIS — Z7984 Long term (current) use of oral hypoglycemic drugs: Secondary | ICD-10-CM | POA: Insufficient documentation

## 2015-09-27 DIAGNOSIS — Z7982 Long term (current) use of aspirin: Secondary | ICD-10-CM | POA: Insufficient documentation

## 2015-09-27 DIAGNOSIS — I110 Hypertensive heart disease with heart failure: Secondary | ICD-10-CM | POA: Insufficient documentation

## 2015-09-27 DIAGNOSIS — E119 Type 2 diabetes mellitus without complications: Secondary | ICD-10-CM

## 2015-09-27 DIAGNOSIS — I639 Cerebral infarction, unspecified: Secondary | ICD-10-CM

## 2015-09-27 DIAGNOSIS — I2510 Atherosclerotic heart disease of native coronary artery without angina pectoris: Secondary | ICD-10-CM | POA: Diagnosis not present

## 2015-09-27 DIAGNOSIS — K5900 Constipation, unspecified: Secondary | ICD-10-CM | POA: Diagnosis not present

## 2015-09-27 DIAGNOSIS — F0280 Dementia in other diseases classified elsewhere without behavioral disturbance: Secondary | ICD-10-CM

## 2015-09-27 DIAGNOSIS — M109 Gout, unspecified: Secondary | ICD-10-CM | POA: Insufficient documentation

## 2015-09-27 DIAGNOSIS — M62838 Other muscle spasm: Secondary | ICD-10-CM | POA: Insufficient documentation

## 2015-09-27 DIAGNOSIS — I739 Peripheral vascular disease, unspecified: Secondary | ICD-10-CM | POA: Diagnosis not present

## 2015-09-27 DIAGNOSIS — J449 Chronic obstructive pulmonary disease, unspecified: Secondary | ICD-10-CM | POA: Diagnosis not present

## 2015-09-27 DIAGNOSIS — E1140 Type 2 diabetes mellitus with diabetic neuropathy, unspecified: Secondary | ICD-10-CM | POA: Insufficient documentation

## 2015-09-27 DIAGNOSIS — Z79899 Other long term (current) drug therapy: Secondary | ICD-10-CM | POA: Diagnosis not present

## 2015-09-27 DIAGNOSIS — E1149 Type 2 diabetes mellitus with other diabetic neurological complication: Secondary | ICD-10-CM

## 2015-09-27 DIAGNOSIS — K5909 Other constipation: Secondary | ICD-10-CM

## 2015-09-27 DIAGNOSIS — F0390 Unspecified dementia without behavioral disturbance: Secondary | ICD-10-CM | POA: Insufficient documentation

## 2015-09-27 LAB — GLUCOSE, POCT (MANUAL RESULT ENTRY): POC GLUCOSE: 107 mg/dL — AB (ref 70–99)

## 2015-09-27 LAB — COMPLETE METABOLIC PANEL WITH GFR
ALT: 17 U/L (ref 9–46)
AST: 16 U/L (ref 10–35)
Albumin: 4 g/dL (ref 3.6–5.1)
Alkaline Phosphatase: 85 U/L (ref 40–115)
BUN: 15 mg/dL (ref 7–25)
CALCIUM: 9.9 mg/dL (ref 8.6–10.3)
CHLORIDE: 108 mmol/L (ref 98–110)
CO2: 27 mmol/L (ref 20–31)
CREATININE: 0.89 mg/dL (ref 0.70–1.25)
GFR, Est African American: >89 mL/min (ref >=60)
GFR, Est Non African American: 89 mL/min (ref >=60)
GLUCOSE: 92 mg/dL (ref 65–99)
Potassium: 5.1 mmol/L (ref 3.5–5.3)
SODIUM: 145 mmol/L (ref 135–146)
Total Bilirubin: 0.4 mg/dL (ref 0.2–1.2)
Total Protein: 6.6 g/dL (ref 6.1–8.1)

## 2015-09-27 LAB — LIPID PANEL
CHOLESTEROL: 233 mg/dL — AB (ref 125–200)
HDL: 48 mg/dL (ref >=40)
LDL CALC: 156 mg/dL — AB (ref <130)
Total CHOL/HDL Ratio: 4.9 Ratio (ref <=5.0)
Triglycerides: 146 mg/dL (ref <150)
VLDL: 29 mg/dL (ref <30)

## 2015-09-27 LAB — POCT GLYCOSYLATED HEMOGLOBIN (HGB A1C): Hemoglobin A1C: 6.1

## 2015-09-27 MED ORDER — ALBUTEROL SULFATE (2.5 MG/3ML) 0.083% IN NEBU: 2.5000 mg | INHALATION_SOLUTION | Freq: Four times a day (QID) | RESPIRATORY_TRACT | 3 refills | Status: DC | PRN

## 2015-09-27 MED ORDER — ATORVASTATIN CALCIUM 80 MG PO TABS: 80.0000 mg | ORAL_TABLET | Freq: Every day | ORAL | 3 refills | Status: DC

## 2015-09-27 MED ORDER — ACCU-CHEK SOFTCLIX LANCET DEV MISC: 5 refills | Status: DC

## 2015-09-27 MED ORDER — ALLOPURINOL 300 MG PO TABS: 300.0000 mg | ORAL_TABLET | Freq: Every day | ORAL | 2 refills | Status: DC

## 2015-09-27 MED ORDER — COLCHICINE 0.6 MG PO TABS: ORAL_TABLET | ORAL | 2 refills | Status: DC

## 2015-09-27 MED ORDER — OMEPRAZOLE 20 MG PO CPDR: 20.0000 mg | DELAYED_RELEASE_CAPSULE | Freq: Every day | ORAL | 3 refills | Status: DC

## 2015-09-27 MED ORDER — MEMANTINE HCL 10 MG PO TABS: 10.0000 mg | ORAL_TABLET | Freq: Two times a day (BID) | ORAL | 3 refills | Status: DC

## 2015-09-27 MED ORDER — ALBUTEROL SULFATE HFA 108 (90 BASE) MCG/ACT IN AERS: 2.0000 | INHALATION_SPRAY | Freq: Four times a day (QID) | RESPIRATORY_TRACT | 3 refills | Status: DC | PRN

## 2015-09-27 MED ORDER — GABAPENTIN 300 MG PO CAPS: 600.0000 mg | ORAL_CAPSULE | Freq: Three times a day (TID) | ORAL | 3 refills | Status: DC

## 2015-09-27 MED ORDER — CARVEDILOL 25 MG PO TABS: 25.0000 mg | ORAL_TABLET | Freq: Two times a day (BID) | ORAL | 3 refills | Status: DC

## 2015-09-27 MED ORDER — LACTULOSE 10 GM/15ML PO SOLN: 10.0000 g | Freq: Two times a day (BID) | ORAL | 2 refills | Status: DC | PRN

## 2015-09-27 MED ORDER — TIZANIDINE HCL 4 MG PO TABS: 4.0000 mg | ORAL_TABLET | Freq: Three times a day (TID) | ORAL | 3 refills | Status: DC | PRN

## 2015-09-27 MED ORDER — CLOPIDOGREL BISULFATE 75 MG PO TABS: 75.0000 mg | ORAL_TABLET | Freq: Every day | ORAL | 3 refills | Status: DC

## 2015-09-27 MED ORDER — FUROSEMIDE 40 MG PO TABS: 60.0000 mg | ORAL_TABLET | Freq: Two times a day (BID) | ORAL | 3 refills | Status: DC

## 2015-09-27 MED FILL — ALBUTEROL 0.083% INHAL SOLN: (2.5 MG/3ML | 30 days supply | Qty: 90 | Fill #0

## 2015-09-27 MED FILL — CARVEDILOL 25 MG TABLET: 25 | 30 days supply | Qty: 60 | Fill #0

## 2015-09-27 NOTE — Progress Notes (Signed)
Subjective:  Patient ID: [name], male    DOB: 1950-10-22  Age: 65 y.o. MRN: [id number]  CC: Hypertension and Diabetes   HPI [name]  is a 65 year old male with a history of HTN, CHF (EF 50-55%), COPD, CVA with mild residual left hemiparesis, hyperlipidemia, gout, dementia PVD here for a follow up visit.  He stopped Metformin due to nausea and currently does not take any medications for his Diabetes; does have some diabetic neuropathy for which he remains on gabapentin.  He has a history of gout and has taken his colchicine and allopurinol and reports no recent gout flares.  His Peripheral vascular disease is managed by [facility] specialist of [facility] and he has his next appointment in 11/2015 with plans for an ABI  He only uses albuterol MDI and albuterol solution for his nebulizer as needed for COPD and stopped using the R and Spiriva; reports control of COPD symptoms.  He is fasting today in anticipation of labs.  Past Medical History:  Diagnosis Date  . Alcohol abuse    H/O  . Anxiety   . Arthritis   . Asthma   . CAD (coronary artery disease) 05/27/10   Cath: severe single vessell CAD left cx midportion obtuse marginal 2 to 3.  [name] CHF (congestive heart failure) ([place])   . COPD (chronic obstructive pulmonary disease) ([place])   . COPD (chronic obstructive pulmonary disease) ([place])   . Depression   . Diabetes mellitus, type 2 ([place]) 03/13/2015  . GERD (gastroesophageal reflux disease)   . History of DVT of lower extremity   . Hyperlipemia   . Hyperlipidemia   . Hypertension   . Myocardial infarction ([place]) 2000  . Orbital fracture ([place]) 12/2012  . OSA on CPAP   . Peripheral vascular disease, unspecified ([place])    08/20/10 doppler: increase in right ABI post-op. Left ABI stable. S/P bi-fem bypass surgery  . Pneumonia 04/04/2012  . Shortness of breath   . Stroke ([place])   . Tobacco abuse     Past Surgical History:  Procedure Laterality Date  .  abdoninal ao angio & bifem angio  05/27/10   Patent graft, occluded bil stents with no retrograde flow into the hypogastric arteries. 100% occl left ant. tibial artery, 70% to 80% to 100% stenosis right superficial fem artery above adductor canal. 100 % occl right ant. tibial vessell  . Aortogram w/ PTCA  09/292003  . BACK SURGERY    . CARDIAC CATHETERIZATION  05/27/10   severe CAD left cx  . CHOLECYSTECTOMY    . ESOPHAGOGASTRIC FUNDOPLICATION    . EYE SURGERY    . FEMORAL BYPASS  08/19/10   Right Fem-Pop  . PERIPHERAL VASCULAR CATHETERIZATION N/A 12/10/2014   Procedure: Abdominal Aortogram;  Surgeon: Angelia Mould, MD;  Location: [facility];  Service: Cardiovascular;  Laterality: N/A;  . RADIOLOGY WITH ANESTHESIA N/A 02/08/2015   Procedure: RADIOLOGY WITH ANESTHESIA;  Surgeon: Luanne Bras, MD;  Location: [facility];  Service: Radiology;  Laterality: N/A;  . TEE WITHOUT CARDIOVERSION N/A 08/29/2014   Procedure: TRANSESOPHAGEAL ECHOCARDIOGRAM (TEE);  Surgeon: Josue Hector, MD;  Location: [facility];  Service: Cardiovascular;  Laterality: N/A;  . TOTAL KNEE ARTHROPLASTY      Allergies  Allergen Reactions  . Darvocet [Propoxyphene N-Acetaminophen] Hives  . Haldol [Haloperidol Decanoate] Hives  . Acetaminophen Nausea Only    Upset stomach, tolerates Hydrocodone/APAP if taken with food Upset stomach, tolerates Hydrocodone/APAP if  taken with food  . Norco [Hydrocodone-Acetaminophen] Nausea And Vomiting    Tolerates if taken with food     Outpatient Medications Prior to Visit  Medication Sig Dispense Refill  . ACCU-CHEK SOFTCLIX LANCETS lancets Use 3 times daily before meals 100 each 5  . acetaminophen-codeine (TYLENOL #3) 300-30 MG tablet Take 1 tablet by mouth every 8 (eight) hours as needed for moderate pain. 60 tablet 1  . aspirin 325 MG EC tablet Take 1 tablet (325 mg total) by mouth daily. 30 tablet 0  . Blood Glucose Monitoring Suppl (ACCU-CHEK AVIVA) device Use  3 times daily before meals. 1 each 0  . glucose blood (ACCU-CHEK AVIVA) test strip Use 3 times daily before meals as directed. 100 each 12  . Misc. Devices (ROLLATOR) MISC 1 each by Does not apply route daily. 1 each 0  . albuterol (PROVENTIL HFA;VENTOLIN HFA) 108 (90 Base) MCG/ACT inhaler Inhale 2 puffs into the lungs every 6 (six) hours as needed for wheezing or shortness of breath. 1 Inhaler 3  . albuterol (PROVENTIL) (2.5 MG/3ML) 0.083% nebulizer solution Take 3 mLs (2.5 mg total) by nebulization 2 (two) times daily as needed for wheezing or shortness of breath. 75 mL 0  . allopurinol (ZYLOPRIM) 300 MG tablet Take 1 tablet (300 mg total) by mouth daily. 30 tablet 2  . atorvastatin (LIPITOR) 80 MG tablet Take 1 tablet (80 mg total) by mouth daily at 6 PM. 30 tablet 3  . carvedilol (COREG) 12.5 MG tablet Take 1 tablet (12.5 mg total) by mouth 2 (two) times daily with a meal. 60 tablet 3  . clopidogrel (PLAVIX) 75 MG tablet Take 1 tablet (75 mg total) by mouth daily. 30 tablet 2  . colchicine 0.6 MG tablet Take 2 tabs (1.2 mg) at the onset of a gout attack, may repeat 1 tablet (0.6 mg) in one hour if symptoms persist 30 tablet 2  . furosemide (LASIX) 40 MG tablet Take 1.5 tablets (60 mg total) by mouth 2 (two) times daily. 90 tablet 3  . gabapentin (NEURONTIN) 300 MG capsule Take 2 capsules (600 mg total) by mouth 3 (three) times daily. 180 capsule 3  . lactulose (CHRONULAC) 10 GM/15ML solution Take 15 mLs (10 g total) by mouth 2 (two) times daily as needed for mild constipation. 946 mL 2  . Lancet Devices (ACCU-CHEK SOFTCLIX) lancets Use 3 times daily before meals 1 each 5  . memantine (NAMENDA) 10 MG tablet Take 1 tablet (10 mg total) by mouth 2 (two) times daily. 60 tablet 2  . metFORMIN (GLUCOPHAGE) 500 MG tablet     . omeprazole (PRILOSEC) 20 MG capsule Take by mouth daily.  0  . pantoprazole (PROTONIX) 40 MG tablet Take 1 tablet (40 mg total) by mouth daily. 30 tablet 3  . tiZANidine  (ZANAFLEX) 4 MG tablet Take 1 tablet (4 mg total) by mouth every 8 (eight) hours as needed for muscle spasms. 90 tablet 3  . Besifloxacin HCl (BESIVANCE) 0.6 % SUSP Place 1 drop into the left eye 2 (two) times daily. Reported on 07/12/2015    . mometasone-formoterol (DULERA) 200-5 MCG/ACT AERO Inhale 2 puffs into the lungs 2 (two) times daily. (Patient not taking: Reported on 09/27/2015) 13 g 3  . predniSONE (DELTASONE) 20 MG tablet Take 2 tablets (40 mg total) by mouth daily with breakfast.  0  . tiotropium (SPIRIVA) 18 MCG inhalation capsule Place 1 capsule (18 mcg total) into inhaler and inhale daily. (Patient not taking: Reported  on 09/27/2015) 30 capsule 3  . triamcinolone cream (KENALOG) 0.1 % Apply 1 application topically 2 (two) times daily. (Patient not taking: Reported on 09/27/2015) 30 g 0   No facility-administered medications prior to visit.     ROS Review of Systems  Constitutional: Negative for activity change and appetite change.  HENT: Negative for sinus pressure and sore throat.   Eyes: Negative for visual disturbance.  Respiratory: Negative for cough, chest tightness and shortness of breath.   Cardiovascular: Positive for leg swelling. Negative for chest pain.  Gastrointestinal: Negative for abdominal distention, abdominal pain, constipation and diarrhea.  Endocrine: Negative.   Genitourinary: Negative for dysuria.  Musculoskeletal: Negative for joint swelling and myalgias.  Skin: Negative for rash.  Allergic/Immunologic: Negative.   Neurological: Negative for weakness, light-headedness and numbness.  Psychiatric/Behavioral: Negative for dysphoric mood and suicidal ideas.    Objective:  BP (!) 175/71 (BP Location: Right Arm, Patient Position: Sitting, Cuff Size: Large)   Pulse 77   Temp 97.5 F (36.4 C) (Oral)   Ht 6' (1.829 m)   Wt 254 lb 3.2 oz (115.3 kg)   SpO2 96%   BMI 34.48 kg/m   BP/Weight 09/27/2015 07/17/1273 02/16/15  Systolic BP 494 496 759  Diastolic BP  71 73 80  Wt. (Lbs) 254.2 - 247.6  BMI 34.48 - 33.57      Physical Exam Constitutional: He is oriented to person, place, and time. He appears well-developed and well-nourished.  Neck: No JVD present. No tracheal deviation present.  Cardiovascular: Normal rate, regular rhythm and normal heart sounds.   No murmur heard. Unable to palpate dorsalis pedis pulses due to edema  Pulmonary/Chest: Effort normal and breath sounds normal. No respiratory distress. He has no wheezes. He exhibits no tenderness.  Abdominal: Soft. Bowel sounds are normal. He exhibits no mass. There is no tenderness.  Musculoskeletal: Normal range of motion. He exhibits edema (bilateral nonpitting dorsal edema)  Mild tenderness on palpation of left side of back.  Neurological: He is alert and oriented to person, place, and time.  Skin: Scaly psoriasiform rash on extensor aspect of both fore arms Psychiatric: He has a normal mood and affect.   Assessment & Plan:   1. Chronic obstructive pulmonary disease, unspecified COPD type (HCC) Stable - albuterol (PROVENTIL HFA;VENTOLIN HFA) 108 (90 Base) MCG/ACT inhaler; Inhale 2 puffs into the lungs every 6 (six) hours as needed for wheezing or shortness of breath.  Dispense: 1 Inhaler; Refill: 3 - albuterol (PROVENTIL) (2.5 MG/3ML) 0.083% nebulizer solution; Take 3 mLs (2.5 mg total) by nebulization every 6 (six) hours as needed for wheezing or shortness of breath.  Dispense: 75 mL; Refill: 3  2. Idiopathic gout, unspecified chronicity, unspecified site No recent flares - allopurinol (ZYLOPRIM) 300 MG tablet; Take 1 tablet (300 mg total) by mouth daily.  Dispense: 30 tablet; Refill: 2 - colchicine 0.6 MG tablet; Take 2 tabs (1.2 mg) at the onset of a gout attack, may repeat 1 tablet (0.6 mg) in one hour if symptoms persist  Dispense: 30 tablet; Refill: 2  3. HLD (hyperlipidemia) Lipid panel today - atorvastatin (LIPITOR) 80 MG tablet; Take 1 tablet (80 mg total) by mouth  daily at 6 PM.  Dispense: 30 tablet; Refill: 3  4. Chronic diastolic congestive heart failure (HCC) EF 50-55% No acute exacerbation - carvedilol (COREG) 25 MG tablet; Take 1 tablet (25 mg total) by mouth 2 (two) times daily with a meal.  Dispense: 60 tablet; Refill: 3 - furosemide (LASIX)  40 MG tablet; Take 1.5 tablets (60 mg total) by mouth 2 (two) times daily.  Dispense: 90 tablet; Refill: 3  5. Acute CVA (cerebrovascular accident) ([place]) Stable - clopidogrel (PLAVIX) 75 MG tablet; Take 1 tablet (75 mg total) by mouth daily.  Dispense: 30 tablet; Refill: 3  6. Type 2 diabetes mellitus with other neurologic complication, without long-term current use of insulin (HCC) Controlled - gabapentin (NEURONTIN) 300 MG capsule; Take 2 capsules (600 mg total) by mouth 3 (three) times daily.  Dispense: 180 capsule; Refill: 3  7. Dementia due to another medical condition, without behavioral disturbance Stable - memantine (NAMENDA) 10 MG tablet; Take 1 tablet (10 mg total) by mouth 2 (two) times daily.  Dispense: 60 tablet; Refill: 3  8. Other constipation Controlled on lactulose - lactulose (CHRONULAC) 10 GM/15ML solution; Take 15 mLs (10 g total) by mouth 2 (two) times daily as needed for mild constipation.  Dispense: 946 mL; Refill: 2  9. Muscle spasm Controlled on Tizanidine Takes Tylenol 3 as needed for back pain and he has been advised to use this sparingly. - tiZANidine (ZANAFLEX) 4 MG tablet; Take 1 tablet (4 mg total) by mouth every 8 (eight) hours as needed for muscle spasms.  Dispense: 90 tablet; Refill: 3  10. Type 2 diabetes mellitus without complication, without long-term current use of insulin (HCC) Diet controlled with A1c of 6.1 Fasting labs performed today Foot exam done today. - Lancet Devices (ACCU-CHEK SOFTCLIX) lancets; Use 3 times daily before meals  Dispense: 1 each; Refill: 5  11. Hypertension Controlled Increase carvedilol from 12.5 to 25 mg twice daily   Meds  ordered this encounter  Medications  . albuterol (PROVENTIL HFA;VENTOLIN HFA) 108 (90 Base) MCG/ACT inhaler    Sig: Inhale 2 puffs into the lungs every 6 (six) hours as needed for wheezing or shortness of breath.    Dispense:  1 Inhaler    Refill:  3  . albuterol (PROVENTIL) (2.5 MG/3ML) 0.083% nebulizer solution    Sig: Take 3 mLs (2.5 mg total) by nebulization every 6 (six) hours as needed for wheezing or shortness of breath.    Dispense:  75 mL    Refill:  3  . allopurinol (ZYLOPRIM) 300 MG tablet    Sig: Take 1 tablet (300 mg total) by mouth daily.    Dispense:  30 tablet    Refill:  2  . atorvastatin (LIPITOR) 80 MG tablet    Sig: Take 1 tablet (80 mg total) by mouth daily at 6 PM.    Dispense:  30 tablet    Refill:  3  . carvedilol (COREG) 25 MG tablet    Sig: Take 1 tablet (25 mg total) by mouth 2 (two) times daily with a meal.    Dispense:  60 tablet    Refill:  3  . clopidogrel (PLAVIX) 75 MG tablet    Sig: Take 1 tablet (75 mg total) by mouth daily.    Dispense:  30 tablet    Refill:  3  . colchicine 0.6 MG tablet    Sig: Take 2 tabs (1.2 mg) at the onset of a gout attack, may repeat 1 tablet (0.6 mg) in one hour if symptoms persist    Dispense:  30 tablet    Refill:  2  . furosemide (LASIX) 40 MG tablet    Sig: Take 1.5 tablets (60 mg total) by mouth 2 (two) times daily.    Dispense:  90 tablet    Refill:  3  . gabapentin (NEURONTIN) 300 MG capsule    Sig: Take 2 capsules (600 mg total) by mouth 3 (three) times daily.    Dispense:  180 capsule    Refill:  3  . memantine (NAMENDA) 10 MG tablet    Sig: Take 1 tablet (10 mg total) by mouth 2 (two) times daily.    Dispense:  60 tablet    Refill:  3  . omeprazole (PRILOSEC) 20 MG capsule    Sig: Take 1 capsule (20 mg total) by mouth daily.    Dispense:  30 capsule    Refill:  3  . lactulose (CHRONULAC) 10 GM/15ML solution    Sig: Take 15 mLs (10 g total) by mouth 2 (two) times daily as needed for mild  constipation.    Dispense:  946 mL    Refill:  2  . tiZANidine (ZANAFLEX) 4 MG tablet    Sig: Take 1 tablet (4 mg total) by mouth every 8 (eight) hours as needed for muscle spasms.    Dispense:  90 tablet    Refill:  3  . Lancet Devices (ACCU-CHEK SOFTCLIX) lancets    Sig: Use 3 times daily before meals    Dispense:  1 each    Refill:  5    Follow-up: Return in about 3 months (around 12/28/2015) for follow up on Diabetes Mellitus.   Arnoldo Morale MD

## 2015-09-27 NOTE — Progress Notes (Signed)
Medication refill- would like all medication here

## 2015-09-27 NOTE — Telephone Encounter (Signed)
Writer called pharmacy back with #60 for the tylenol 3.

## 2015-09-28 LAB — MICROALBUMIN / CREATININE URINE RATIO
Creatinine, Urine: 47 mg/dL (ref 20–370)
Microalb, Ur: <0.2 mg/dL

## 2015-10-02 ENCOUNTER — Telehealth: Payer: Self-pay

## 2015-10-02 NOTE — Telephone Encounter (Signed)
Writer called patient regarding his cholesterol being elevated and the fact that he is on a maximum dose of lipitor.  Patient was encouraged to live a healthy life style and eat foods that are low in cholesterol. Patient stated understanding in regards to dieting and medication.  Patient ended the conversation by stating " she is wasting all my damn minutes".

## 2015-10-02 NOTE — Telephone Encounter (Signed)
Pt called to speak with nurse regarding his medication. Pt stated that in his lab his cholestrol levels were high and wanted to know if he should resume his lipitor meds or not. He was asked to stop prior to labs. Please follow up.  Thank you

## 2015-10-02 NOTE — Telephone Encounter (Signed)
-----   Message from Arnoldo Morale, MD sent at 10/01/2015  2:02 PM EDT ----- He is on the maximum dose of Lipitor and his cholesterol is still elevated. Please encourage adherence to low cholesterol diet and other lifestyle changes.

## 2015-10-03 NOTE — Telephone Encounter (Signed)
I am not sure where he got the information to discontinue his lipitor but he needs to take it.

## 2015-10-04 NOTE — Telephone Encounter (Signed)
Called patient back per Dr. [name] to inform him that he was never to stop the lipitor.  Patient states that he started back on it yesterday.

## 2015-10-15 ENCOUNTER — Emergency Department (HOSPITAL_COMMUNITY): Payer: Medicare Other

## 2015-10-15 ENCOUNTER — Encounter (HOSPITAL_COMMUNITY): Payer: Self-pay | Admitting: Emergency Medicine

## 2015-10-15 ENCOUNTER — Observation Stay (HOSPITAL_COMMUNITY)
Admission: EM | Admit: 2015-10-15 | Discharge: 2015-10-16 | Disposition: A | Discharge status: Home / Self Care | Payer: Medicare Other | Attending: Internal Medicine | Admitting: Internal Medicine

## 2015-10-15 DIAGNOSIS — I951 Orthostatic hypotension: Principal | ICD-10-CM | POA: Insufficient documentation

## 2015-10-15 DIAGNOSIS — E119 Type 2 diabetes mellitus without complications: Secondary | ICD-10-CM | POA: Diagnosis not present

## 2015-10-15 DIAGNOSIS — E860 Dehydration: Secondary | ICD-10-CM | POA: Diagnosis not present

## 2015-10-15 DIAGNOSIS — K219 Gastro-esophageal reflux disease without esophagitis: Secondary | ICD-10-CM | POA: Diagnosis not present

## 2015-10-15 DIAGNOSIS — F10129 Alcohol abuse with intoxication, unspecified: Secondary | ICD-10-CM | POA: Insufficient documentation

## 2015-10-15 DIAGNOSIS — I2510 Atherosclerotic heart disease of native coronary artery without angina pectoris: Secondary | ICD-10-CM | POA: Insufficient documentation

## 2015-10-15 DIAGNOSIS — G4733 Obstructive sleep apnea (adult) (pediatric): Secondary | ICD-10-CM | POA: Insufficient documentation

## 2015-10-15 DIAGNOSIS — Z86718 Personal history of other venous thrombosis and embolism: Secondary | ICD-10-CM | POA: Insufficient documentation

## 2015-10-15 DIAGNOSIS — I110 Hypertensive heart disease with heart failure: Secondary | ICD-10-CM | POA: Diagnosis not present

## 2015-10-15 DIAGNOSIS — J449 Chronic obstructive pulmonary disease, unspecified: Secondary | ICD-10-CM

## 2015-10-15 DIAGNOSIS — M1000 Idiopathic gout, unspecified site: Secondary | ICD-10-CM

## 2015-10-15 DIAGNOSIS — N179 Acute kidney failure, unspecified: Secondary | ICD-10-CM | POA: Diagnosis not present

## 2015-10-15 DIAGNOSIS — I959 Hypotension, unspecified: Secondary | ICD-10-CM | POA: Diagnosis present

## 2015-10-15 DIAGNOSIS — J189 Pneumonia, unspecified organism: Secondary | ICD-10-CM | POA: Diagnosis present

## 2015-10-15 DIAGNOSIS — M109 Gout, unspecified: Secondary | ICD-10-CM | POA: Insufficient documentation

## 2015-10-15 DIAGNOSIS — I252 Old myocardial infarction: Secondary | ICD-10-CM | POA: Insufficient documentation

## 2015-10-15 DIAGNOSIS — Y905 Blood alcohol level of 100-119 mg/100 ml: Secondary | ICD-10-CM | POA: Diagnosis not present

## 2015-10-15 DIAGNOSIS — E876 Hypokalemia: Secondary | ICD-10-CM | POA: Insufficient documentation

## 2015-10-15 DIAGNOSIS — I639 Cerebral infarction, unspecified: Secondary | ICD-10-CM

## 2015-10-15 DIAGNOSIS — I5032 Chronic diastolic (congestive) heart failure: Secondary | ICD-10-CM

## 2015-10-15 DIAGNOSIS — E1149 Type 2 diabetes mellitus with other diabetic neurological complication: Secondary | ICD-10-CM

## 2015-10-15 DIAGNOSIS — Z7982 Long term (current) use of aspirin: Secondary | ICD-10-CM | POA: Insufficient documentation

## 2015-10-15 DIAGNOSIS — Z7902 Long term (current) use of antithrombotics/antiplatelets: Secondary | ICD-10-CM | POA: Insufficient documentation

## 2015-10-15 DIAGNOSIS — Z79899 Other long term (current) drug therapy: Secondary | ICD-10-CM | POA: Diagnosis not present

## 2015-10-15 DIAGNOSIS — E785 Hyperlipidemia, unspecified: Secondary | ICD-10-CM

## 2015-10-15 DIAGNOSIS — M62838 Other muscle spasm: Secondary | ICD-10-CM

## 2015-10-15 DIAGNOSIS — F1410 Cocaine abuse, uncomplicated: Secondary | ICD-10-CM | POA: Diagnosis not present

## 2015-10-15 DIAGNOSIS — I739 Peripheral vascular disease, unspecified: Secondary | ICD-10-CM | POA: Insufficient documentation

## 2015-10-15 DIAGNOSIS — F0280 Dementia in other diseases classified elsewhere without behavioral disturbance: Secondary | ICD-10-CM

## 2015-10-15 DIAGNOSIS — R0602 Shortness of breath: Secondary | ICD-10-CM

## 2015-10-15 DIAGNOSIS — F17200 Nicotine dependence, unspecified, uncomplicated: Secondary | ICD-10-CM | POA: Insufficient documentation

## 2015-10-15 DIAGNOSIS — K5909 Other constipation: Secondary | ICD-10-CM

## 2015-10-15 DIAGNOSIS — E1142 Type 2 diabetes mellitus with diabetic polyneuropathy: Secondary | ICD-10-CM

## 2015-10-15 DIAGNOSIS — M1990 Unspecified osteoarthritis, unspecified site: Secondary | ICD-10-CM | POA: Diagnosis not present

## 2015-10-15 HISTORY — DX: Pneumonia, unspecified organism: J18.9

## 2015-10-15 LAB — PROTIME-INR
INR: 1.06
PROTHROMBIN TIME: 13.8 s (ref 11.4–15.2)

## 2015-10-15 LAB — CBC WITH DIFFERENTIAL/PLATELET
BASOS PCT: 1 %
Basophils Absolute: 0.1 10*3/uL (ref 0.0–0.1)
EOS ABS: 0.4 10*3/uL (ref 0.0–0.7)
EOS PCT: 5 %
HCT: 48.8 % (ref 39.0–52.0)
HEMOGLOBIN: 15.4 g/dL (ref 13.0–17.0)
LYMPHS ABS: 2.4 10*3/uL (ref 0.7–4.0)
Lymphocytes Relative: 29 %
MCH: 28.9 pg (ref 26.0–34.0)
MCHC: 31.6 g/dL (ref 30.0–36.0)
MCV: 91.7 fL (ref 78.0–100.0)
MONO ABS: 0.8 10*3/uL (ref 0.1–1.0)
MONOS PCT: 10 %
NEUTROS PCT: 55 %
Neutro Abs: 4.5 10*3/uL (ref 1.7–7.7)
PLATELETS: 183 10*3/uL (ref 150–400)
RBC: 5.32 MIL/uL (ref 4.22–5.81)
RDW: 14.6 % (ref 11.5–15.5)
WBC: 8.2 10*3/uL (ref 4.0–10.5)

## 2015-10-15 LAB — COMPREHENSIVE METABOLIC PANEL
ALBUMIN: 3.7 g/dL (ref 3.5–5.0)
ALK PHOS: 83 U/L (ref 38–126)
ALT: 26 U/L (ref 17–63)
ANION GAP: 8 (ref 5–15)
AST: 20 U/L (ref 15–41)
BUN: 7 mg/dL (ref 6–20)
CALCIUM: 9.4 mg/dL (ref 8.9–10.3)
CHLORIDE: 105 mmol/L (ref 101–111)
CO2: 25 mmol/L (ref 22–32)
Creatinine, Ser: 1.32 mg/dL — ABNORMAL HIGH (ref 0.61–1.24)
GFR calc Af Amer: >60 mL/min (ref >60)
GFR calc non Af Amer: 55 mL/min — ABNORMAL LOW (ref >60)
GLUCOSE: 230 mg/dL — AB (ref 65–99)
Potassium: 3.6 mmol/L (ref 3.5–5.1)
SODIUM: 138 mmol/L (ref 135–145)
Total Bilirubin: 0.7 mg/dL (ref 0.3–1.2)
Total Protein: 6.2 g/dL — ABNORMAL LOW (ref 6.5–8.1)

## 2015-10-15 LAB — URINALYSIS, ROUTINE W REFLEX MICROSCOPIC
BILIRUBIN URINE: NEGATIVE
GLUCOSE, UA: NEGATIVE mg/dL
KETONES UR: NEGATIVE mg/dL
Leukocytes, UA: NEGATIVE
NITRITE: NEGATIVE
PH: 6.5 (ref 5.0–8.0)
Protein, ur: NEGATIVE mg/dL
SPECIFIC GRAVITY, URINE: 1.011 (ref 1.005–1.030)

## 2015-10-15 LAB — URINE MICROSCOPIC-ADD ON

## 2015-10-15 LAB — RAPID URINE DRUG SCREEN, HOSP PERFORMED
AMPHETAMINES: NOT DETECTED
BENZODIAZEPINES: NOT DETECTED
Barbiturates: NOT DETECTED
Cocaine: POSITIVE — AB
OPIATES: NOT DETECTED
TETRAHYDROCANNABINOL: NOT DETECTED

## 2015-10-15 LAB — I-STAT CHEM 8, ED
BUN: 8 mg/dL (ref 6–20)
CALCIUM ION: 1.13 mmol/L — AB (ref 1.15–1.40)
CHLORIDE: 102 mmol/L (ref 101–111)
Creatinine, Ser: 1.5 mg/dL — ABNORMAL HIGH (ref 0.61–1.24)
GLUCOSE: 222 mg/dL — AB (ref 65–99)
HEMATOCRIT: 49 % (ref 39.0–52.0)
Hemoglobin: 16.7 g/dL (ref 13.0–17.0)
POTASSIUM: 3.6 mmol/L (ref 3.5–5.1)
Sodium: 141 mmol/L (ref 135–145)
TCO2: 24 mmol/L (ref 0–100)

## 2015-10-15 LAB — I-STAT CG4 LACTIC ACID, ED: LACTIC ACID, VENOUS: 2.49 mmol/L — AB (ref 0.5–1.9)

## 2015-10-15 LAB — PROCALCITONIN: Procalcitonin: <0.1 ng/mL

## 2015-10-15 LAB — I-STAT TROPONIN, ED: Troponin i, poc: 0 ng/mL (ref 0.00–0.08)

## 2015-10-15 LAB — GLUCOSE, CAPILLARY: Glucose-Capillary: 127 mg/dL — ABNORMAL HIGH (ref 65–99)

## 2015-10-15 LAB — LACTIC ACID, PLASMA
LACTIC ACID, VENOUS: 1.3 mmol/L (ref 0.5–1.9)
Lactic Acid, Venous: 0.9 mmol/L (ref 0.5–1.9)

## 2015-10-15 LAB — APTT: APTT: 27 s (ref 24–36)

## 2015-10-15 LAB — TSH: TSH: 0.474 u[IU]/mL (ref 0.350–4.500)

## 2015-10-15 LAB — BRAIN NATRIURETIC PEPTIDE: B Natriuretic Peptide: 56.2 pg/mL (ref 0.0–100.0)

## 2015-10-15 LAB — ETHANOL: Alcohol, Ethyl (B): 106 mg/dL — ABNORMAL HIGH (ref <5)

## 2015-10-15 MED ORDER — AZITHROMYCIN 500 MG PO TABS
250.0000 mg | ORAL_TABLET | Freq: Every day | ORAL | Status: DC
Start: 2015-10-16 — End: 2015-10-16
  Administered 2015-10-16: 250 mg via ORAL
  Filled 2015-10-15: qty 1

## 2015-10-15 MED ORDER — LORAZEPAM 2 MG/ML IJ SOLN: 1.0000 mg | Freq: Four times a day (QID) | INTRAMUSCULAR | Status: DC | PRN

## 2015-10-15 MED ORDER — FOLIC ACID 1 MG PO TABS
1.0000 mg | ORAL_TABLET | Freq: Every day | ORAL | Status: DC
  Administered 2015-10-15 – 2015-10-16 (×2): 1 mg via ORAL
  Filled 2015-10-15 (×2): qty 1

## 2015-10-15 MED ORDER — ALLOPURINOL 100 MG PO TABS
300.0000 mg | ORAL_TABLET | Freq: Every day | ORAL | Status: DC
  Administered 2015-10-16: 300 mg via ORAL
  Filled 2015-10-15: qty 3

## 2015-10-15 MED ORDER — ALBUTEROL SULFATE HFA 108 (90 BASE) MCG/ACT IN AERS: 2.0000 | INHALATION_SPRAY | Freq: Four times a day (QID) | RESPIRATORY_TRACT | Status: DC | PRN

## 2015-10-15 MED ORDER — PANTOPRAZOLE SODIUM 40 MG PO TBEC
40.0000 mg | DELAYED_RELEASE_TABLET | Freq: Every day | ORAL | Status: DC
Start: 2015-10-16 — End: 2015-10-16
  Administered 2015-10-16: 40 mg via ORAL
  Filled 2015-10-15: qty 1

## 2015-10-15 MED ORDER — LORAZEPAM 1 MG PO TABS: 1.0000 mg | ORAL_TABLET | Freq: Four times a day (QID) | ORAL | Status: DC | PRN

## 2015-10-15 MED ORDER — TIZANIDINE HCL 4 MG PO TABS: 4.0000 mg | ORAL_TABLET | Freq: Four times a day (QID) | ORAL | Status: DC | PRN

## 2015-10-15 MED ORDER — ALBUTEROL SULFATE (2.5 MG/3ML) 0.083% IN NEBU: 2.5000 mg | INHALATION_SOLUTION | Freq: Four times a day (QID) | RESPIRATORY_TRACT | Status: DC | PRN

## 2015-10-15 MED ORDER — ENOXAPARIN SODIUM 40 MG/0.4ML ~~LOC~~ SOLN
40.0000 mg | SUBCUTANEOUS | Status: DC
  Administered 2015-10-15: 40 mg via SUBCUTANEOUS
  Filled 2015-10-15: qty 0.4

## 2015-10-15 MED ORDER — ACETAMINOPHEN-CODEINE #3 300-30 MG PO TABS: 1.0000 | ORAL_TABLET | Freq: Three times a day (TID) | ORAL | Status: DC | PRN

## 2015-10-15 MED ORDER — INSULIN ASPART 100 UNIT/ML ~~LOC~~ SOLN
0.0000 [IU] | Freq: Three times a day (TID) | SUBCUTANEOUS | Status: DC
  Administered 2015-10-16 (×2): 2 [IU] via SUBCUTANEOUS

## 2015-10-15 MED ORDER — NOREPINEPHRINE BITARTRATE 1 MG/ML IV SOLN
0.0000 ug/min | Freq: Once | INTRAVENOUS | Status: DC
  Filled 2015-10-15: qty 4

## 2015-10-15 MED ORDER — CARVEDILOL 12.5 MG PO TABS
25.0000 mg | ORAL_TABLET | Freq: Two times a day (BID) | ORAL | Status: DC
Start: 2015-10-15 — End: 2015-10-16
  Administered 2015-10-15 – 2015-10-16 (×3): 25 mg via ORAL
  Filled 2015-10-15 (×3): qty 2

## 2015-10-15 MED ORDER — DEXTROSE 5 % IV SOLN
500.0000 mg | Freq: Once | INTRAVENOUS | Status: AC
  Administered 2015-10-15: 500 mg via INTRAVENOUS
  Filled 2015-10-15: qty 500

## 2015-10-15 MED ORDER — CLOPIDOGREL BISULFATE 75 MG PO TABS
75.0000 mg | ORAL_TABLET | Freq: Every day | ORAL | Status: DC
  Administered 2015-10-16: 75 mg via ORAL
  Filled 2015-10-15: qty 1

## 2015-10-15 MED ORDER — SODIUM CHLORIDE 0.9 % IV BOLUS (SEPSIS)
1000.0000 mL | Freq: Once | INTRAVENOUS | Status: AC
  Administered 2015-10-15: 1000 mL via INTRAVENOUS

## 2015-10-15 MED ORDER — DEXTROSE 5 % IV SOLN
1.0000 g | Freq: Once | INTRAVENOUS | Status: AC
  Administered 2015-10-15: 1 g via INTRAVENOUS
  Filled 2015-10-15: qty 10

## 2015-10-15 MED ORDER — ADULT MULTIVITAMIN W/MINERALS CH
1.0000 | ORAL_TABLET | Freq: Every day | ORAL | Status: DC
  Administered 2015-10-15 – 2015-10-16 (×2): 1 via ORAL
  Filled 2015-10-15 (×2): qty 1

## 2015-10-15 MED ORDER — DEXTROSE 5 % IV SOLN
1.0000 g | INTRAVENOUS | Status: DC
  Administered 2015-10-16: 1 g via INTRAVENOUS
  Filled 2015-10-15: qty 10

## 2015-10-15 MED ORDER — SODIUM CHLORIDE 0.9 % IV SOLN
INTRAVENOUS | Status: DC
  Administered 2015-10-15 – 2015-10-16 (×3): via INTRAVENOUS

## 2015-10-15 MED ORDER — THIAMINE HCL 100 MG/ML IJ SOLN: 100.0000 mg | Freq: Every day | INTRAMUSCULAR | Status: DC

## 2015-10-15 MED ORDER — GABAPENTIN 300 MG PO CAPS
600.0000 mg | ORAL_CAPSULE | Freq: Three times a day (TID) | ORAL | Status: DC
  Administered 2015-10-15 – 2015-10-16 (×3): 600 mg via ORAL
  Filled 2015-10-15 (×3): qty 2

## 2015-10-15 MED ORDER — ASPIRIN EC 325 MG PO TBEC
325.0000 mg | DELAYED_RELEASE_TABLET | Freq: Every day | ORAL | Status: DC
  Administered 2015-10-16: 325 mg via ORAL
  Filled 2015-10-15: qty 1

## 2015-10-15 MED ORDER — VITAMIN B-1 100 MG PO TABS
100.0000 mg | ORAL_TABLET | Freq: Every day | ORAL | Status: DC
  Administered 2015-10-15 – 2015-10-16 (×2): 100 mg via ORAL
  Filled 2015-10-15 (×2): qty 1

## 2015-10-15 NOTE — Progress Notes (Signed)
Pharmacy Antibiotic Note  [name] is a 65 y.o. male admitted on 10/15/2015 with hypotension and weakness.  Pharmacy has been consulted for ceftriaxone dosing for CAP. Already received first 1g dose at 15:10. Also on azithromycin.  Plan: Ceftriaxone 1 g IV q24h Recommend complete 7 days - last day of antibiotics 10/21/15 This is not renally adjusted so pharmacy signing off     Temp (24hrs), Avg:98.5 F (36.9 C), Min:98.4 F (36.9 C), Max:98.5 F (36.9 C)   Recent Labs Lab 10/15/15 1422 10/15/15 1431 10/15/15 1524  WBC 8.2  --   --   CREATININE 1.32* 1.50*  --   LATICACIDVEN  --   --  2.49*    CrCl cannot be calculated (Unknown ideal weight.).    Allergies  Allergen Reactions  . Darvocet [Propoxyphene N-Acetaminophen] Hives  . Haldol [Haloperidol Decanoate] Hives  . Acetaminophen Nausea Only    Upset stomach, tolerates Hydrocodone/APAP if taken with food  . Metformin And Related Nausea And Vomiting  . Norco [Hydrocodone-Acetaminophen] Nausea And Vomiting    Tolerates if taken with food     Thank you for allowing pharmacy to be a part of this patient's care.  Renold Genta, PharmD, BCPS Clinical Pharmacist Phone for today - [facility] - [phone] 10/15/2015 5:56 PM

## 2015-10-15 NOTE — ED Triage Notes (Signed)
Pt states he started to not feel right and she pulled over and  Took his bp [facility] and it was low, states vision is blurry

## 2015-10-15 NOTE — H&P (Signed)
Date: 10/15/2015               Patient Name:  [name] MRN: [id number]  DOB: Jun 22, 1950 Age / Sex: 65 y.o., male   PCP: Arnoldo Morale, MD         Medical Service: Internal Medicine Teaching Service         Attending Physician: Dr. Lucious Groves, DO    First Contact: Dr. Einar Gip, DO Pager: [phone]  Second Contact: Dr. Julious Oka, MD Pager: [phone]       After Hours (After 5p/  First Contact Pager: [phone]  weekends / holidays): Second Contact Pager: [phone]   Chief Complaint: hypotension.   History of Present Illness:  This is a 65 year old male with MHx significant for CHF, CVA with residual left arm weakness, COPD, HTN, HLD, MI in 2000 and tobacco abuse who presents to the ED with his home health nurse for evaluation of hypotension. The patient reports he was a passenger in a car when he suddenly felt light-headed and that his vision was going black. Per his home health aide who was driving the car he looked like he was nodding off and going limp. She took his blood pressure at that time which was 70's/50's and repeat BP revealed the same. He denies any prior history of similar episodes however he admits to being lightheaded with standing often. He reports he is feeling less lightheaded since arriving in the ED.  The patient reports he was in his usual health before this occurred however his home health aid reports his cough and shortness of breath have worsened over the last week. He complains of increased shortness of breath, increased cough and increased sputum production. He describes his sputum as dark green and thick. Per patient he always has these symptoms but have worsened over the past week.  Denies any fevers, chills, chest pain, abdominal pain, diarrhea, constipation, or worsening lower extremity edema.   In the ED he was hypotensive with blood pressures in the 70's/50's on arrival. He was given 2L of NS with improvement to the 110's. He was also given '4mg'$   of Levophed while in ED. Per ED note, he reports going on a "bender" over the weekend.   Meds:  Current Meds  Medication Sig  . ACCU-CHEK SOFTCLIX LANCETS lancets Use 3 times daily before meals  . acetaminophen-codeine (TYLENOL #3) 300-30 MG tablet Take 1 tablet by mouth every 8 (eight) hours as needed for moderate pain.  [name] albuterol (PROVENTIL HFA;VENTOLIN HFA) 108 (90 Base) MCG/ACT inhaler Inhale 2 puffs into the lungs every 6 (six) hours as needed for wheezing or shortness of breath.  [name] albuterol (PROVENTIL) (2.5 MG/3ML) 0.083% nebulizer solution Take 3 mLs (2.5 mg total) by nebulization every 6 (six) hours as needed for wheezing or shortness of breath.  . allopurinol (ZYLOPRIM) 300 MG tablet Take 1 tablet (300 mg total) by mouth daily.  [name] aspirin 325 MG EC tablet Take 1 tablet (325 mg total) by mouth daily.  [name] atorvastatin (LIPITOR) 80 MG tablet Take 1 tablet (80 mg total) by mouth daily at 6 PM. (Patient taking differently: Take 80 mg by mouth every morning. )  . Blood Glucose Monitoring Suppl (ACCU-CHEK AVIVA) device Use 3 times daily before meals.  . carvedilol (COREG) 25 MG tablet Take 1 tablet (25 mg total) by mouth 2 (two) times daily with a meal.  . clopidogrel (PLAVIX) 75 MG tablet Take 1 tablet (75 mg  total) by mouth daily.  . colchicine 0.6 MG tablet Take 2 tabs (1.2 mg) at the onset of a gout attack, may repeat 1 tablet (0.6 mg) in one hour if symptoms persist (Patient taking differently: Take 1.2 mg by mouth daily. )  . furosemide (LASIX) 40 MG tablet Take 1.5 tablets (60 mg total) by mouth 2 (two) times daily.  [name] gabapentin (NEURONTIN) 300 MG capsule Take 2 capsules (600 mg total) by mouth 3 (three) times daily.  [name] glucose blood (ACCU-CHEK AVIVA) test strip Use 3 times daily before meals as directed.  . lactulose (CHRONULAC) 10 GM/15ML solution Take 15 mLs (10 g total) by mouth 2 (two) times daily as needed for mild constipation.  Elmore Guise Devices (ACCU-CHEK SOFTCLIX) lancets Use  3 times daily before meals  . memantine (NAMENDA) 10 MG tablet Take 1 tablet (10 mg total) by mouth 2 (two) times daily.  . Misc. Devices (ROLLATOR) MISC 1 each by Does not apply route daily.  [name] omeprazole (PRILOSEC) 20 MG capsule Take 1 capsule (20 mg total) by mouth daily.  [name] tiZANidine (ZANAFLEX) 4 MG tablet Take 1 tablet (4 mg total) by mouth every 8 (eight) hours as needed for muscle spasms. (Patient taking differently: Take 4 mg by mouth every 6 (six) hours as needed for muscle spasms. )    Allergies: Allergies as of 10/15/2015 - Review Complete 10/15/2015  Allergen Reaction Noted  . Darvocet [propoxyphene n-acetaminophen] Hives 08/28/2010  . Haldol [haloperidol decanoate] Hives 08/28/2010  . Acetaminophen Nausea Only 11/01/2014  . Norco [hydrocodone-acetaminophen] Nausea And Vomiting 12/07/2014   Past Medical History:  Diagnosis Date  . Alcohol abuse    H/O  . Anxiety   . Arthritis   . Asthma   . CAD (coronary artery disease) 05/27/10   Cath: severe single vessell CAD left cx midportion obtuse marginal 2 to 3.  [name] CHF (congestive heart failure) ([place])   . COPD (chronic obstructive pulmonary disease) ([place])   . COPD (chronic obstructive pulmonary disease) ([place])   . Depression   . Diabetes mellitus, type 2 ([place]) 03/13/2015  . GERD (gastroesophageal reflux disease)   . History of DVT of lower extremity   . Hyperlipemia   . Hyperlipidemia   . Hypertension   . Myocardial infarction ([place]) 2000  . Orbital fracture ([place]) 12/2012  . OSA on CPAP   . Peripheral vascular disease, unspecified ([place])    08/20/10 doppler: increase in right ABI post-op. Left ABI stable. S/P bi-fem bypass surgery  . Pneumonia 04/04/2012  . Shortness of breath   . Stroke ([place])   . Tobacco abuse     Family History:  Patient reports he does not know any family history.  Social History:  Tobacco: 2 packs/day for 55 years - no interest in quitting at this time Alcohol: Hx of alcoholism. Went on a bender  over the weekend. Last drink yesterday ("not a lot, just a 40 oz.") Drug use: "none that I want to disclose"  Review of Systems: A complete ROS was negative except as per HPI.   Physical Exam: Blood pressure 113/56, pulse 71, temperature 98.5 F (36.9 C), resp. rate (!) 27, SpO2 97 %. General: Alert, obese elderly male resting comfortably in bed. In no acute distress. Sounds hoarse and phlegmy when speaking. Able to speak in complete sentences.  Cardiovascular: Distant heart sounds. Regular rate and rhythm. No murmurs, gallops or rubs auscultated. Pulmonary: Some crackles at right lung base. Remaining fields CTA BL. No wheezing. Abdomen:  Distended, non-tender. No guarding or rigidity present.  Extremities: 2+ edema up to the shins BL . No erythema or increased warmth.  Skin: warm and dry   EKG:   EKG Interpretation  Date/Time:  Tuesday October 15 2015 14:15:31 EDT Ventricular Rate:  79 PR Interval:  168 QRS Duration: 90 QT Interval:  410 QTC Calculation: 470 R Axis:   59 Text Interpretation:  Normal sinus rhythm Normal ECG No significant change since last tracing Confirmed by FLOYD MD, Quillian Quince [phone]) on 10/15/2015 2:43:35 PM Also confirmed by Tyrone Nine MD, DANIEL [phone]), editor [place] CT, Leda Gauze [phone])  on 10/15/2015 3:27:58 PM      1-View CXR: Read as no acute pulmonary abnormality however there appears to be a left sided consolidation of lung base.   Assessment & Plan by Problem: Active Problems:   HCAP (healthcare-associated pneumonia)   CAP (community acquired pneumonia)  HCAP Patient from SNF. Patient given 1 dose of Azithromycin '500mg'$  and Rocephin 1 gram while in ED. Rocephin continued and Azithromycin 250 mg ordered x4 days starting tomorrow morning. VSS at this time. Patient satting well in the high 90's without use of supplemental O2. No leukocytosis however pt does have lactic acid of 2.5.  -Trending lactic acid levels. -2 view chest ordered for further  evaluation -Chest physiotherapy ordered Q4H while awake -Albuterol nebulizer PRN -Procalcitonin to help differentiate between bacterial vs viral causes -Blood cultures ordered  Hypotension Resolved at this time. Patient presented to ED with BP 60's/40's. He received 2 L bolus and 4 mg Levophed with resolution of his hypotension. He is currently in the 120's/60's. His usual blood pressure is in the 160's-170's -Will monitor pressures closely  CHF Limited US of the heart performed in ED showed an unspecified severity of pericardial effusion without cardiac tamponade. Will order TTE for evaluation.   -Last echo 07/2014 showed an EF of 50-55% with grade 1 diastolic dysfunction -Home Lasix (60 mg BID) held in setting of AKI and patient does not appear volume overloaded at this time -TTE for evaluation of effusion detected in ED -Coreg 25 mg BID continued  Alcohol Intoxication Patient reports his last drink was yesterday and that he "didn't have that much, only a 40 oz" He told the ER physician that he went on a bender this weekend however stated he didn't drink more than usual to me. Also, when asked about drug use patient reports there were "none that he wants to disclose." -CIWA -No signs of withdrawal at this time  -UDS pending  AKI - Creatinine 1.5 today and appears to have a normal baseline. -This coupled with patients hypotension to indicates a dehydration picture secondary to patients recent binge drinking -Will monitor    HTN -Patient here with complaints of hypotension which has resolved at this time. Home coreg continued.   Hx of CVA -Patient has residual left arm weakness and mild left leg weakness. Patient denies any worsening of his symptoms  -Plavix 75 mg, ASA, Atorvastatin 80 mg continued  Type 2 Diabetes -Last A1c=6.7% 02/06/15 -Repeat A1c ordered -ISS -Gapabtentin 600 mg TID continued  Gout -No acute flare -Allopurinol 300 mg continued  DVT Prophylaxis: Lovenox   Diet: HH/carb modified Fluid: NSL Code: Full code  Dispo: Admit patient to Inpatient with expected length of stay greater than 2 midnights.  SignedEinar Gip, DO 10/15/2015, 4:54 PM  Pager: [phone]

## 2015-10-15 NOTE — ED Notes (Signed)
Attempted report x1. 

## 2015-10-15 NOTE — Progress Notes (Signed)
Patient stated that he didn't want to wear CPAP tonight.  RT advised patient if he changed his mind to inform respiratory.  RT will continue to monitor.

## 2015-10-15 NOTE — ED Notes (Signed)
Dr. Tyrone Nine at bedside, aware BP 67/40

## 2015-10-15 NOTE — ED Provider Notes (Signed)
[facility] DEPT Provider Note   CSN: [id number] Arrival date & time: 10/15/15  1406     History   Chief Complaint Chief Complaint  Patient presents with  . Hypotension    HPI [name] is a 65 y.o. male.  65 yo M with a chief complaint of cough and congestion. This been going on for a week or more. Patient feels like it's been getting worse. Denies any fevers or chills. Has had some progressive weakness over the course of today. Got to the point where he felt very lightheaded like his to pass out. Felt like his vision was dimming. Denies syncopal event denies hitting his head. Denies chest pain shortness breath abdominal pain. Patient states that he went on a bender this weekend.   The history is provided by the patient.  Illness  This is a new problem. The current episode started more than 1 week ago. The problem occurs constantly. The problem has been gradually worsening. Pertinent negatives include no chest pain, no abdominal pain, no headaches and no shortness of breath. Nothing aggravates the symptoms. Nothing relieves the symptoms. He has tried nothing for the symptoms. The treatment provided no relief.    Past Medical History:  Diagnosis Date  . Alcohol abuse    H/O  . Anxiety   . Arthritis   . Asthma   . CAD (coronary artery disease) 05/27/10   Cath: severe single vessell CAD left cx midportion obtuse marginal 2 to 3.  [name] CHF (congestive heart failure) ([place])   . COPD (chronic obstructive pulmonary disease) ([place])   . COPD (chronic obstructive pulmonary disease) ([place])   . Depression   . Diabetes mellitus, type 2 ([place]) 03/13/2015  . GERD (gastroesophageal reflux disease)   . History of DVT of lower extremity   . Hyperlipemia   . Hyperlipidemia   . Hypertension   . Myocardial infarction ([place]) 2000  . Orbital fracture ([place]) 12/2012  . OSA on CPAP   . Peripheral vascular disease, unspecified ([place])    08/20/10 doppler: increase in right ABI post-op. Left ABI  stable. S/P bi-fem bypass surgery  . Pneumonia 04/04/2012  . Shortness of breath   . Stroke ([place])   . Tobacco abuse     Patient Active Problem List   Diagnosis Date Noted  . HCAP (healthcare-associated pneumonia) 10/15/2015  . Psoriasiform dermatitis 07/12/2015  . Dementia due to another medical condition 07/12/2015  . Urinary incontinence 03/14/2015  . Diabetes mellitus, type 2 ([place]) 03/13/2015  . Hemiparesis affecting left side as late effect of cerebrovascular accident ([place]) 02/18/2015  . Cerebrovascular accident (CVA) due to stenosis of right carotid artery ([place])   . Coronary artery disease involving native coronary artery of native heart without angina pectoris   . Tachypnea   . Thrombocytopenia ([place])   . History of stroke   . Stroke (cerebrum) ([place]) 02/06/2015  . Arterial ischemic stroke, MCA (middle cerebral artery), right, acute ([place])   . DOE (dyspnea on exertion) 11/23/2014  . PAF (paroxysmal atrial fibrillation) ([place]) 11/23/2014  . HLD (hyperlipidemia) 11/01/2014  . Tobacco use disorder 11/01/2014  . Diastolic CHF, acute on chronic (HCC) 11/01/2014  . Cerebral infarction due to stenosis of right carotid artery ([place]) 11/01/2014  . Carotid stenosis 11/01/2014  . Hyperkalemia 10/24/2014  . H/O total knee replacement 10/24/2014  . Essential hypertension   . Polysubstance abuse 08/28/2014  . Cocaine abuse   . Intracranial carotid stenosis   . Headache around the eyes 08/27/2014  .  Acute CVA (cerebrovascular accident) ([place]) 08/27/2014  . CVA (cerebral vascular accident) ([place]) 08/26/2014  . History of DVT (deep vein thrombosis)   . Alcoholism ([place])   . Embolic stroke ([place])   . Cerebral infarction due to unspecified mechanism   . Other specified hypotension   . AKI (acute kidney injury) ([place])   . Stroke ([place])   . CVA (cerebral infarction) 08/01/2014  . Alcohol dependence with withdrawal with complication ([place]) 38/25/0539  . DVT (deep venous thrombosis) ([place]) 02/22/2014   . Lower extremity edema 02/21/2014  . Diastolic CHF ([place]) 76/73/4193  . CAD (coronary artery disease) 11/21/2013  . Alcohol abuse 11/21/2013  . GERD (gastroesophageal reflux disease) 08/10/2013  . Gout 08/10/2013  . Tobacco abuse 07/26/2013  . Trichiasis without entropion 04/16/2013  . Dizziness 04/10/2013  . Eyelid lesion 01/24/2013  . Enophthalmos due to trauma 01/19/2013  . Medial orbital wall fracture (HCC) 01/19/2013  . Closed blow-out fracture of floor of orbit ([place]) 01/19/2013  . Binocular vision disorder with diplopia 01/03/2013  . Fracture of inferior orbital wall ([place]) 01/03/2013  . Fracture of orbital floor ([place]) 01/03/2013  . Pain of left eye 01/03/2013  . Peripheral vascular disease ([place]) 07/27/2012  . COPD (chronic obstructive pulmonary disease) ([place]) 04/04/2012  . HTN (hypertension) 04/04/2012    Past Surgical History:  Procedure Laterality Date  . abdoninal ao angio & bifem angio  05/27/10   Patent graft, occluded bil stents with no retrograde flow into the hypogastric arteries. 100% occl left ant. tibial artery, 70% to 80% to 100% stenosis right superficial fem artery above adductor canal. 100 % occl right ant. tibial vessell  . Aortogram w/ PTCA  09/292003  . BACK SURGERY    . CARDIAC CATHETERIZATION  05/27/10   severe CAD left cx  . CHOLECYSTECTOMY    . ESOPHAGOGASTRIC FUNDOPLICATION    . EYE SURGERY    . FEMORAL BYPASS  08/19/10   Right Fem-Pop  . PERIPHERAL VASCULAR CATHETERIZATION N/A 12/10/2014   Procedure: Abdominal Aortogram;  Surgeon: Angelia Mould, MD;  Location: [facility];  Service: Cardiovascular;  Laterality: N/A;  . RADIOLOGY WITH ANESTHESIA N/A 02/08/2015   Procedure: RADIOLOGY WITH ANESTHESIA;  Surgeon: Luanne Bras, MD;  Location: [facility];  Service: Radiology;  Laterality: N/A;  . TEE WITHOUT CARDIOVERSION N/A 08/29/2014   Procedure: TRANSESOPHAGEAL ECHOCARDIOGRAM (TEE);  Surgeon: Josue Hector, MD;  Location: [facility] ENDOSCOPY;   Service: Cardiovascular;  Laterality: N/A;  . TOTAL KNEE ARTHROPLASTY         Home Medications    Prior to Admission medications   Medication Sig Start Date End Date Taking? Authorizing Provider  ACCU-CHEK SOFTCLIX LANCETS lancets Use 3 times daily before meals 09/17/15   Arnoldo Morale, MD  acetaminophen-codeine (TYLENOL #3) 300-30 MG tablet Take 1 tablet by mouth every 8 (eight) hours as needed for moderate pain. 09/17/15   Arnoldo Morale, MD  albuterol (PROVENTIL HFA;VENTOLIN HFA) 108 (90 Base) MCG/ACT inhaler Inhale 2 puffs into the lungs every 6 (six) hours as needed for wheezing or shortness of breath. 09/27/15   Arnoldo Morale, MD  albuterol (PROVENTIL) (2.5 MG/3ML) 0.083% nebulizer solution Take 3 mLs (2.5 mg total) by nebulization every 6 (six) hours as needed for wheezing or shortness of breath. 09/27/15   Arnoldo Morale, MD  allopurinol (ZYLOPRIM) 300 MG tablet Take 1 tablet (300 mg total) by mouth daily. 09/27/15   Arnoldo Morale, MD  aspirin 325 MG EC tablet Take 1 tablet (325 mg total)  by mouth daily. 01/16/15   Arnoldo Morale, MD  atorvastatin (LIPITOR) 80 MG tablet Take 1 tablet (80 mg total) by mouth daily at 6 PM. 09/27/15   Arnoldo Morale, MD  Besifloxacin HCl (BESIVANCE) 0.6 % SUSP Place 1 drop into the left eye 2 (two) times daily. Reported on 07/12/2015    Historical Provider, MD  Blood Glucose Monitoring Suppl (ACCU-CHEK AVIVA) device Use 3 times daily before meals. 03/13/15   Arnoldo Morale, MD  carvedilol (COREG) 25 MG tablet Take 1 tablet (25 mg total) by mouth 2 (two) times daily with a meal. 09/27/15   Arnoldo Morale, MD  clopidogrel (PLAVIX) 75 MG tablet Take 1 tablet (75 mg total) by mouth daily. 09/27/15   Arnoldo Morale, MD  colchicine 0.6 MG tablet Take 2 tabs (1.2 mg) at the onset of a gout attack, may repeat 1 tablet (0.6 mg) in one hour if symptoms persist 09/27/15   Arnoldo Morale, MD  furosemide (LASIX) 40 MG tablet Take 1.5 tablets (60 mg total) by mouth 2 (two) times daily. 09/27/15    Arnoldo Morale, MD  gabapentin (NEURONTIN) 300 MG capsule Take 2 capsules (600 mg total) by mouth 3 (three) times daily. 09/27/15   Arnoldo Morale, MD  glucose blood (ACCU-CHEK AVIVA) test strip Use 3 times daily before meals as directed. 03/13/15   Arnoldo Morale, MD  lactulose (CHRONULAC) 10 GM/15ML solution Take 15 mLs (10 g total) by mouth 2 (two) times daily as needed for mild constipation. 09/27/15   Arnoldo Morale, MD  Lancet Devices [facility]) lancets Use 3 times daily before meals 09/27/15   Arnoldo Morale, MD  memantine (NAMENDA) 10 MG tablet Take 1 tablet (10 mg total) by mouth 2 (two) times daily. 09/27/15   Arnoldo Morale, MD  Misc. Devices (ROLLATOR) MISC 1 each by Does not apply route daily. 02/27/15   Arnoldo Morale, MD  omeprazole (PRILOSEC) 20 MG capsule Take 1 capsule (20 mg total) by mouth daily. 09/27/15   Arnoldo Morale, MD  tiZANidine (ZANAFLEX) 4 MG tablet Take 1 tablet (4 mg total) by mouth every 8 (eight) hours as needed for muscle spasms. 09/27/15   Arnoldo Morale, MD    Family History Family History  Problem Relation Age of Onset  . Heart attack Mother   . Heart failure Mother   . Cirrhosis Father   . Heart failure Brother   . Cancer Brother   . Hypertension Neg Hx     UNKNOWN  . Stroke Neg Hx     UNKNOWN    Social History Social History  Substance Use Topics  . Smoking status: Current Every Day Smoker    Packs/day: 1.50    Years: 50.00    Types: Cigars, Cigarettes    Start date: 02/09/1961  . Smokeless tobacco: Former Systems developer    Types: Chew     Comment: 2 ppd, full flavor  . Alcohol use 0.6 - 1.2 oz/week    1 - 2 Cans of beer per week     Comment: 10/24/14 no drinking for 2 weeks     Allergies   Darvocet [propoxyphene n-acetaminophen]; Haldol [haloperidol decanoate]; Acetaminophen; and Norco [hydrocodone-acetaminophen]   Review of Systems Review of Systems  Constitutional: Negative for chills and fever.  HENT: Negative for congestion and facial swelling.     Eyes: Negative for discharge and visual disturbance.  Respiratory: Negative for shortness of breath.   Cardiovascular: Negative for chest pain and palpitations.  Gastrointestinal: Negative for abdominal pain, diarrhea and  vomiting.  Musculoskeletal: Negative for arthralgias and myalgias.  Skin: Negative for color change and rash.  Neurological: Positive for weakness and light-headedness. Negative for tremors, syncope and headaches.  Psychiatric/Behavioral: Negative for confusion and dysphoric mood.     Physical Exam Updated Vital Signs BP 114/55   Pulse 73   Temp 98.5 F (36.9 C)   Resp 19   SpO2 97%   Physical Exam  Constitutional: He is oriented to person, place, and time. He appears well-developed and well-nourished.  HENT:  Head: Normocephalic and atraumatic.  Eyes: Conjunctivae and EOM are normal. Pupils are equal, round, and reactive to light.  Neck: Normal range of motion. No JVD present.  Cardiovascular: Normal rate and regular rhythm.   Distant heart sounds  Pulmonary/Chest: Effort normal. No stridor. No respiratory distress. He has rhonchi in the right lower field.  Abdominal: He exhibits no distension and no mass. There is no tenderness. There is no rebound and no guarding.  Musculoskeletal: Normal range of motion. He exhibits edema (3+ to BLE).  Neurological: He is alert and oriented to person, place, and time.  Skin: Skin is warm and dry.  Psychiatric: He has a normal mood and affect. His behavior is normal.     ED Treatments / Results  Labs (all labs ordered are listed, but only abnormal results are displayed) Labs Reviewed  ETHANOL - Abnormal; Notable for the following:       Result Value   Alcohol, Ethyl (B) 106 (*)    All other components within normal limits  COMPREHENSIVE METABOLIC PANEL - Abnormal; Notable for the following:    Glucose, Bld 230 (*)    Creatinine, Ser 1.32 (*)    Total Protein 6.2 (*)    GFR calc non Af Amer 55 (*)    All other  components within normal limits  I-STAT CHEM 8, ED - Abnormal; Notable for the following:    Creatinine, Ser 1.50 (*)    Glucose, Bld 222 (*)    Calcium, Ion 1.13 (*)    All other components within normal limits  I-STAT CG4 LACTIC ACID, ED - Abnormal; Notable for the following:    Lactic Acid, Venous 2.49 (*)    All other components within normal limits  CULTURE, BLOOD (ROUTINE X 2)  CULTURE, BLOOD (ROUTINE X 2)  URINE CULTURE  PROTIME-INR  APTT  CBC WITH DIFFERENTIAL/PLATELET  BRAIN NATRIURETIC PEPTIDE  CBC  DIFFERENTIAL  URINE RAPID DRUG SCREEN, HOSP PERFORMED  URINALYSIS, ROUTINE W REFLEX MICROSCOPIC (NOT AT [facility])  I-STAT TROPOININ, ED    EKG  EKG Interpretation  Date/Time:  Tuesday October 15 2015 14:15:31 EDT Ventricular Rate:  79 PR Interval:  168 QRS Duration: 90 QT Interval:  410 QTC Calculation: 470 R Axis:   59 Text Interpretation:  Normal sinus rhythm Normal ECG No significant change since last tracing Confirmed by Jeniah Kishi MD, Quillian Quince ([id number]) on 10/15/2015 2:43:35 PM Also confirmed by Tyrone Nine MD, DANIEL [phone]), editor [address] [place], Leda Gauze [phone])  on 10/15/2015 3:27:58 PM       Radiology Dg Chest Portable 1 View  Result Date: 10/15/2015 CLINICAL DATA:  65 year old male with hypotension. History of asthma, COPD and hypertension/ diabetes. Smoker. Initial encounter. EXAM: PORTABLE CHEST 1 VIEW COMPARISON:  02/06/2015 and 08/19/2014 chest x-ray. FINDINGS: Cardiomegaly. Central pulmonary vascular prominence without pulmonary edema. Minimal peribronchial thickening. No segmental consolidation or plain film evidence of pulmonary malignancy although evaluation limited by portable technique and slightly poor inspiration. Aortic arch partially calcified. Right carotid  stent in place.  Left carotid calcifications. Acromioclavicular joint degenerative changes. IMPRESSION: Cardiomegaly. No acute pulmonary abnormality noted on this portable exam as noted above. Electronically Signed    By: Genia Del M.D.   On: 10/15/2015 15:53    Procedures Procedures (including critical care time)   EMERGENCY DEPARTMENT [place] CARDIAC EXAM "Study: Limited Ultrasound of the heart and pericardium"  INDICATIONS:Hypotension and Dyspnea Multiple views of the heart and pericardium are obtained with a multi-frequency probe.  PERFORMED RF:XJOITG  IMAGES ARCHIVED?: Yes  FINDINGS: Small effusion, Normal contractility, IVC dilated and Tamponade physiology absent  LIMITATIONS:  Body habitus and Emergent procedure  VIEWS USED: Subcostal 4 chamber, Parasternal long axis and Inferior Vena Cava  INTERPRETATION: Cardiac activity present, Pericardial effusion present, Cardiac tamponade absent, Probable elevated CVP and Normal contractility  COMMENT:  Likely elevated CVP  Medications Ordered in ED Medications  cefTRIAXone (ROCEPHIN) 1 g in dextrose 5 % 50 mL IVPB (0 g Intravenous Stopped 10/15/15 1545)  azithromycin (ZITHROMAX) 500 mg in dextrose 5 % 250 mL IVPB (0 mg Intravenous Stopped 10/15/15 1615)  sodium chloride 0.9 % bolus 1,000 mL (0 mLs Intravenous Stopped 10/15/15 1615)     Initial Impression / Assessment and Plan / ED Course  I have reviewed the triage vital signs and the nursing notes.  Pertinent labs & imaging results that were available during my care of the patient were reviewed by me and considered in my medical decision making (see chart for details).  Clinical Course    65 yo M With a chief complaint of weakness. Patient hypertensive on arrival with blood pressure in the 70s. Patient was given 2 L of fluid with improvement into the 110s. Patient clinically overloaded bedside ultrasound with dilated IVC. With patient's history of cough and congestion presumptively gave him treatment for CAP.   CRITICAL CARE Performed by: Cecilio Asper   Total critical care time: 76 minutes  Critical care time was exclusive of separately billable procedures and treating other  patients.  Critical care was necessary to treat or prevent imminent or life-threatening deterioration.  Critical care was time spent personally by me on the following activities: development of treatment plan with patient and/or surrogate as well as nursing, discussions with consultants, evaluation of patient's response to treatment, examination of patient, obtaining history from patient or surrogate, ordering and performing treatments and interventions, ordering and review of laboratory studies, ordering and review of radiographic studies, pulse oximetry and re-evaluation of patient's condition.    Final Clinical Impressions(s) / ED Diagnoses   Final diagnoses:  None    New Prescriptions New Prescriptions   No medications on file     Deno Etienne, DO 10/15/15 1635

## 2015-10-15 NOTE — ED Notes (Signed)
Dr. Floyd at bedside. 

## 2015-10-15 NOTE — ED Notes (Signed)
Pt unable to void at this time urinal at bedside

## 2015-10-16 ENCOUNTER — Observation Stay (HOSPITAL_BASED_OUTPATIENT_CLINIC_OR_DEPARTMENT_OTHER): Payer: Medicare Other

## 2015-10-16 ENCOUNTER — Observation Stay (HOSPITAL_COMMUNITY): Payer: Medicare Other

## 2015-10-16 ENCOUNTER — Encounter (HOSPITAL_COMMUNITY): Payer: Self-pay | Admitting: General Practice

## 2015-10-16 DIAGNOSIS — F10129 Alcohol abuse with intoxication, unspecified: Secondary | ICD-10-CM | POA: Diagnosis present

## 2015-10-16 DIAGNOSIS — Z888 Allergy status to other drugs, medicaments and biological substances status: Secondary | ICD-10-CM

## 2015-10-16 DIAGNOSIS — I319 Disease of pericardium, unspecified: Secondary | ICD-10-CM

## 2015-10-16 DIAGNOSIS — I69354 Hemiplegia and hemiparesis following cerebral infarction affecting left non-dominant side: Secondary | ICD-10-CM

## 2015-10-16 DIAGNOSIS — I9589 Other hypotension: Secondary | ICD-10-CM | POA: Diagnosis not present

## 2015-10-16 DIAGNOSIS — I5032 Chronic diastolic (congestive) heart failure: Secondary | ICD-10-CM

## 2015-10-16 DIAGNOSIS — F10229 Alcohol dependence with intoxication, unspecified: Secondary | ICD-10-CM

## 2015-10-16 DIAGNOSIS — Z7982 Long term (current) use of aspirin: Secondary | ICD-10-CM

## 2015-10-16 DIAGNOSIS — I951 Orthostatic hypotension: Secondary | ICD-10-CM | POA: Diagnosis not present

## 2015-10-16 DIAGNOSIS — N179 Acute kidney failure, unspecified: Secondary | ICD-10-CM | POA: Diagnosis not present

## 2015-10-16 DIAGNOSIS — Z79899 Other long term (current) drug therapy: Secondary | ICD-10-CM

## 2015-10-16 DIAGNOSIS — M1A9XX0 Chronic gout, unspecified, without tophus (tophi): Secondary | ICD-10-CM

## 2015-10-16 DIAGNOSIS — I110 Hypertensive heart disease with heart failure: Secondary | ICD-10-CM | POA: Diagnosis not present

## 2015-10-16 DIAGNOSIS — Z7902 Long term (current) use of antithrombotics/antiplatelets: Secondary | ICD-10-CM

## 2015-10-16 DIAGNOSIS — F17210 Nicotine dependence, cigarettes, uncomplicated: Secondary | ICD-10-CM

## 2015-10-16 DIAGNOSIS — Z885 Allergy status to narcotic agent status: Secondary | ICD-10-CM

## 2015-10-16 DIAGNOSIS — E119 Type 2 diabetes mellitus without complications: Secondary | ICD-10-CM

## 2015-10-16 DIAGNOSIS — Z886 Allergy status to analgesic agent status: Secondary | ICD-10-CM

## 2015-10-16 LAB — BASIC METABOLIC PANEL
ANION GAP: 12 (ref 5–15)
Anion gap: 3 — ABNORMAL LOW (ref 5–15)
BUN: 10 mg/dL (ref 6–20)
BUN: 9 mg/dL (ref 6–20)
CALCIUM: 8.8 mg/dL — AB (ref 8.9–10.3)
CO2: 26 mmol/L (ref 22–32)
CO2: 31 mmol/L (ref 22–32)
Calcium: 9.3 mg/dL (ref 8.9–10.3)
Chloride: 106 mmol/L (ref 101–111)
Chloride: 112 mmol/L — ABNORMAL HIGH (ref 101–111)
Creatinine, Ser: 0.94 mg/dL (ref 0.61–1.24)
Creatinine, Ser: 0.98 mg/dL (ref 0.61–1.24)
Glucose, Bld: 129 mg/dL — ABNORMAL HIGH (ref 65–99)
Glucose, Bld: 111 mg/dL — ABNORMAL HIGH (ref 65–99)
POTASSIUM: 5 mmol/L (ref 3.5–5.1)
Potassium: 3.1 mmol/L — ABNORMAL LOW (ref 3.5–5.1)
SODIUM: 146 mmol/L — AB (ref 135–145)
Sodium: 144 mmol/L (ref 135–145)

## 2015-10-16 LAB — URINE CULTURE: Culture: NO GROWTH

## 2015-10-16 LAB — GLUCOSE, CAPILLARY
GLUCOSE-CAPILLARY: 137 mg/dL — AB (ref 65–99)
Glucose-Capillary: 121 mg/dL — ABNORMAL HIGH (ref 65–99)

## 2015-10-16 LAB — MAGNESIUM: Magnesium: 1.8 mg/dL (ref 1.7–2.4)

## 2015-10-16 MED ORDER — POTASSIUM CHLORIDE CRYS ER 20 MEQ PO TBCR
40.0000 meq | EXTENDED_RELEASE_TABLET | Freq: Once | ORAL | Status: AC
  Administered 2015-10-16: 40 meq via ORAL
  Filled 2015-10-16: qty 2

## 2015-10-16 MED ORDER — POTASSIUM CHLORIDE 10 MEQ/100ML IV SOLN
10.0000 meq | INTRAVENOUS | Status: AC
  Administered 2015-10-16 (×2): 10 meq via INTRAVENOUS
  Filled 2015-10-16 (×2): qty 100

## 2015-10-16 MED ORDER — COLCHICINE 0.6 MG PO TABS: ORAL_TABLET | ORAL | 2 refills | Status: DC

## 2015-10-16 NOTE — Progress Notes (Signed)
  Echocardiogram 2D Echocardiogram has been performed.  [name] 10/16/2015, 3:52 PM

## 2015-10-16 NOTE — Progress Notes (Signed)
Subjective:  The patient was seen and evaluated at bedside. He reports his lightheadedness has improved completely since admission and he feels like he's back at his baseline. Denies any fevers, chills or chest pain overnight.   Objective:  Vital signs in last 24 hours: Vitals:   10/15/15 1755 10/15/15 1800 10/15/15 2018 10/16/15 0545  BP: 123/68  (!) 148/70 (!) 151/56  Pulse: 74  77 80  Resp: '17  17 18  '$ Temp: 98.4 F (36.9 C)  98.3 F (36.8 C) 98.3 F (36.8 C)  TempSrc: Oral  Oral Oral  SpO2: 97%  97% 90%  Weight:  256 lb 3.2 oz (116.2 kg)    Height:  6' (1.829 m)     Scheduled Medications . allopurinol  300 mg Oral Daily  . aspirin  325 mg Oral Daily  . azithromycin  250 mg Oral Daily  . carvedilol  25 mg Oral BID WC  . cefTRIAXone (ROCEPHIN)  IV  1 g Intravenous Q24H  . clopidogrel  75 mg Oral Daily  . enoxaparin (LOVENOX) injection  40 mg Subcutaneous Q24H  . folic acid  1 mg Oral Daily  . gabapentin  600 mg Oral TID  . insulin aspart  0-15 Units Subcutaneous TID WC  . multivitamin with minerals  1 tablet Oral Daily  . pantoprazole  40 mg Oral Daily  . thiamine  100 mg Oral Daily   Or  . thiamine  100 mg Intravenous Daily   Physical Exam  Constitutional: He is well-developed, well-nourished, and in no distress.  Cardiovascular: Normal rate, regular rhythm and normal heart sounds.  Exam reveals no gallop and no friction rub.   No murmur heard. Pulmonary/Chest: Effort normal and breath sounds normal. No respiratory distress. He has no wheezes. He has no rales.  Abdominal: Soft. Bowel sounds are normal. He exhibits no distension. There is no tenderness.  Musculoskeletal:  1+ pitting edema BL. Improved from yesterdays examination  Neurological: He is alert.  Skin: Skin is warm and dry. He is not diaphoretic.   2-View Chest X-ray: Hyperexpanded lungs with chronic interstitial thickening, likely chronic inflammatory changes. No frank edema or consolidation.    Assessment/Plan:  Active Problems:   HCAP (healthcare-associated pneumonia)   CAP (community acquired pneumonia)  HCAP Two normal chest x-rays. Afebrile and without leukocytosis. Lactic acid originally 2.5 however this resolved quickly after admission. Procalcitonin <0.10 likely indicating a nonbacterial cause for his symptomology. Patient reports his cough and sputum production are back at baseline. This is likely his COPD vs unlikely HCAP at this point.   Hypotension No more hypotensive episodes since admission. He is now back at his baseline BP of 160's - does not want increased blood pressure control. Patients symptoms likely secondary to dehydration in the setting of a recent alcohol binge with patient on diuretic therapy. His pressures and symptoms improved significantly with fluid administration. Now reports he feels he is at his baseline.   CHF Limited US performed in ED showed a pleural effusion of unspecified quantity. ECHO ordered for evaluation of this and prelim results show a minimal effusion. Fluid status appears improved since yesterday. -Restart lasix on discharge -Coreg 25 mg BID continued  AKI Resolved. Initial creatinine 1.5, down to 0.94 today.   Alcohol Intoxication -Patient reports he drank over the weekend however denied any alcohol use over the past 3 days. Patients ethanol level elevated at 100 (0.1 bac).  -No withdrawal symptoms -CIWA  Type 2 Diabetes -Most recent HbA1c 6.1.  -  Continue dietary management -Continue Gabapentin   History of CVA -Plavix, ASA, Statin continued  Hypokalemia 3.1 this morning. Replaced with 40 mEq K-dur and 10 mEq IV. Magnesium level normal at 1.8. Repeat BMET this afternoon shows potassium of 5.0.   Dispo: Anticipated discharge in approximately 0-1 day(s).   Arly Salminen, DO 10/16/2015, 11:00 AM Pager: [phone]

## 2015-10-16 NOTE — Discharge Summary (Signed)
Name: [name] MRN: [id number] DOB: [birth year] 65 y.o. PCP: Arnoldo Morale, MD  Date of Admission: 10/15/2015  2:14 PM Date of Discharge: 10/16/2015 Attending Physician: Lucious Groves, DO  Discharge Diagnosis: 1. Hypotension secondary to dehydration Active Problems:   HCAP (healthcare-associated pneumonia)   CAP (community acquired pneumonia)   Discharge Medications:   Medication List    STOP taking these medications   omeprazole 20 MG capsule Commonly known as:  PRILOSEC     TAKE these medications   ACCU-CHEK AVIVA device Use 3 times daily before meals.   accu-chek softclix lancets Use 3 times daily before meals   ACCU-CHEK SOFTCLIX LANCETS lancets Use 3 times daily before meals   acetaminophen-codeine 300-30 MG tablet Commonly known as:  TYLENOL #3 Take 1 tablet by mouth every 8 (eight) hours as needed for moderate pain.   albuterol 108 (90 Base) MCG/ACT inhaler Commonly known as:  PROVENTIL HFA;VENTOLIN HFA Inhale 2 puffs into the lungs every 6 (six) hours as needed for wheezing or shortness of breath.   albuterol (2.5 MG/3ML) 0.083% nebulizer solution Commonly known as:  PROVENTIL Take 3 mLs (2.5 mg total) by nebulization every 6 (six) hours as needed for wheezing or shortness of breath.   allopurinol 300 MG tablet Commonly known as:  ZYLOPRIM Take 1 tablet (300 mg total) by mouth daily.   aspirin 325 MG EC tablet Take 1 tablet (325 mg total) by mouth daily.   atorvastatin 40 MG tablet Commonly known as:  LIPITOR Take 40 mg by mouth every morning. What changed:  Another medication with the same name was removed. Continue taking this medication, and follow the directions you see here.   carvedilol 25 MG tablet Commonly known as:  COREG Take 1 tablet (25 mg total) by mouth 2 (two) times daily with a meal.   clopidogrel 75 MG tablet Commonly known as:  PLAVIX Take 1 tablet (75 mg total) by mouth daily.   colchicine 0.6 MG tablet Take 2 tabs  (1.2 mg) at the onset of a gout attack, may repeat 1 tablet (0.6 mg) in one hour if symptoms persist What changed:  how much to take  how to take this  when to take this  additional instructions   furosemide 40 MG tablet Commonly known as:  LASIX Take 1.5 tablets (60 mg total) by mouth 2 (two) times daily.   gabapentin 300 MG capsule Commonly known as:  NEURONTIN Take 2 capsules (600 mg total) by mouth 3 (three) times daily.   glucose blood test strip Commonly known as:  ACCU-CHEK AVIVA Use 3 times daily before meals as directed.   lactulose 10 GM/15ML solution Commonly known as:  CHRONULAC Take 15 mLs (10 g total) by mouth 2 (two) times daily as needed for mild constipation.   memantine 10 MG tablet Commonly known as:  NAMENDA Take 1 tablet (10 mg total) by mouth 2 (two) times daily.   pantoprazole 40 MG tablet Commonly known as:  PROTONIX Take 40 mg by mouth every morning.   ROLLATOR Misc 1 each by Does not apply route daily.   tiZANidine 4 MG tablet Commonly known as:  ZANAFLEX Take 1 tablet (4 mg total) by mouth every 8 (eight) hours as needed for muscle spasms. What changed:  when to take this   triamcinolone cream 0.1 % Commonly known as:  KENALOG Apply 1 application topically daily as needed (for dermatitis).       Disposition and follow-up:   Mr.Kallan  L Rosasco was discharged from [facility] in stable condition.  At the hospital follow up visit please address:  1.  Is he taking all medications as instructed? How is his shortness of breath? How is his lightheadedness? Need to assess fluid status and alcohol use.    2.  Labs / imaging needed at time of follow-up: none.  3.  Pending labs/ test needing follow-up: none.  Follow-up Appointments:   Hospital Course by problem list: Active Problems:   HCAP (healthcare-associated pneumonia)   CAP (community acquired pneumonia)   1. Hypotension Secondary to Dehydration MR. Bulreston  was admitted to [facility] for an evaluation of a 1 day history of hypotension. Patient was a passenger in the car with his home health nurse driving and reports he felt like he was going to pass out. Nurse pulled over and took his blood pressure which was in the 70's/50's. Was same on repeat measurement as well so she took him to the hospital for evaluation. Patient reports he was in his usual health until this morning. Patient told ED staff he went on a "bender" over the weekend but denied any significant alcohol use over the past 3 days to the primary team. Ethanol level 100 (0.10 bac). In the ED he received 2L ns and 4 mg Levophed for BP 60's/40's. He responded quickly to these actions and was normotensive at time of admission. He continued to receive IV hydration during his time in the hospital with eventual return of his normal BP (160's). He was initially started on Azithromycin and Ceftriaxone however two chest x-rays did not reveal any infiltrate. Procalcitonin <0.10, which indicates this is likely not bacterial in nature. Patient reports he is feeling back to baseline and is ready for discharge to home with assistance from his home health nurse.   Discharge Vitals:   BP (!) 169/73 (BP Location: Left Arm)   Pulse 65   Temp 97.8 F (36.6 C) (Oral)   Resp 14   Ht 6' (1.829 m)   Wt 256 lb 3.2 oz (116.2 kg)   SpO2 95%   BMI 34.75 kg/m   Pertinent Labs, Studies, and Procedures:  1-view chest: Normal, questionable left lower lobe infiltrate 2-view chest: Without infiltrate. Evidence for COPD Initial lactic acid 2.5, resolved quickly with fluid administration No leukocytosis. Echo: no evidence for tamponade  Discharge Instructions: Follow up with your primary care physician in 1 week. Take all medications as instructed. Avoid overuse of alcohol. Please try to quit smoking.  Return to ED if your symptoms return or worsen.    SignedEinar Gip, DO 10/16/2015, 4:09 PM     Pager: [phone]

## 2015-10-16 NOTE — Care Management Note (Signed)
Case Management Note  Patient Details  Name: [name] MRN: [id number] Date of Birth: Nov 02, 1950  Subjective/Objective:                 Patient from home w/ PNA. Patient lives alone, has PCS through [facility] for 2.5 hours a day 5 days a week. Patient has DME, nebulizer, cane and rolator. PCP Dr. [name], Uses [facility] on [address].   Action/Plan:  No DC needs identified at this time.   Expected Discharge Date:                  Expected Discharge Plan:  Home/Self Care  In-House Referral:  NA  Discharge planning Services  CM Consult  Post Acute Care Choice:  NA Choice offered to:  NA  DME Arranged:  N/A DME Agency:  NA  HH Arranged:  NA HH Agency:  NA  Status of Service:  Completed, signed off  If discussed at [place] of Stay Meetings, dates discussed:    Additional Comments:  Carles Collet, RN 10/16/2015, 11:34 AM

## 2015-10-16 NOTE — Care Management CC44 (Signed)
Condition Code 44 Documentation Completed  Patient Details  Name: [name] MRN: [id number] Date of Birth: 07/03/1950   Condition Code 44 given:  Yes Patient signature on Condition Code 44 notice:  Yes Documentation of 2 MD's agreement:  Yes Code 44 added to claim:  Yes    Carles Collet, RN 10/16/2015, 11:32 AM

## 2015-10-17 ENCOUNTER — Other Ambulatory Visit: Payer: Self-pay | Admitting: Pharmacist

## 2015-10-17 ENCOUNTER — Telehealth: Payer: Self-pay | Admitting: Family Medicine

## 2015-10-17 LAB — HEMOGLOBIN A1C
Hgb A1c MFr Bld: 6.1 % — ABNORMAL HIGH (ref 4.8–5.6)
MEAN PLASMA GLUCOSE: 128 mg/dL

## 2015-10-17 LAB — ECHOCARDIOGRAM COMPLETE
HEIGHTINCHES: 72 in
Weight: 4099.2 oz

## 2015-10-17 MED ORDER — PANTOPRAZOLE SODIUM 40 MG PO TBEC: 40.0000 mg | DELAYED_RELEASE_TABLET | ORAL | 2 refills | Status: DC

## 2015-10-17 NOTE — Telephone Encounter (Signed)
[name] (PCA) called to speak with nurse or doctor regarding patient's condition. [name] stated that patient was admitted on Tuesday for low bp and discharged yesterday. [name] took his bp today and its low again. [name] needs a call back because she doesn't understand why patient was discharged and why hospital informed patient to discontinue taking meds that PCP had prescribed. Please follow [phone]  Thank you

## 2015-10-17 NOTE — Telephone Encounter (Signed)
Writer call [name] back who states that she visited patient this morning and his BP was 82/50.  Patient was sitting in a chair chain smoking and refused to stop.  [name] was going to call the ED however patient told her that he was not going back there! Patient also was asked to stop taking his omeprazole in the ED and he told [name] he was going to continue taking it, which he did this morning.  [name] also stated that she feels that patient is using illicit "drugs", and thinks it may be cocaine on top of the drinking. She also stated that patient continued to talk about dying and killing himself and that she will hear about him in the news.  He does not want to live...Marland Kitchenhe expressed that to her several times. [name] to visit patient in the morning.  She will call and update then.

## 2015-10-18 NOTE — Telephone Encounter (Signed)
Please inform her to call the crises number.

## 2015-10-20 LAB — CULTURE, BLOOD (ROUTINE X 2)
Culture: NO GROWTH
Culture: NO GROWTH

## 2015-10-21 NOTE — Telephone Encounter (Signed)
Writer called [name] (PCA) to see how patient was doing.  She reports patient is doing much better. Writer scheduled a f/u  For patient on the 11/05/15 at 10:00. [name] will bring patient in for his appt and will call before if anything arises.

## 2015-10-29 ENCOUNTER — Telehealth: Payer: Self-pay | Admitting: Family Medicine

## 2015-10-29 NOTE — Telephone Encounter (Signed)
[name] Electrical engineer) called the office to inform nurse and/or PCP that [place] is not giving pt the diabetic shoe that she (PCP) had prescribed. [name] is waiting until doctor signs and faxes over document. Please follow up with [place] and then [place].   Thank you.

## 2015-10-30 NOTE — Telephone Encounter (Signed)
I have not come across any such document.

## 2015-10-31 NOTE — Telephone Encounter (Signed)
Rn advised per Dr. [name] we have not received a document from [place].  States she will Engineer, civil (consulting) and have form sent.  No further action needed at this time  Priscille Heidelberg, Therapist, sports, BSN

## 2015-11-01 ENCOUNTER — Telehealth: Payer: Self-pay | Admitting: Family Medicine

## 2015-11-01 NOTE — Telephone Encounter (Signed)
[name] dropped off paperwork to be completed by the doctor. Please follow up.

## 2015-11-05 ENCOUNTER — Ambulatory Visit: Payer: Self-pay | Admitting: Family Medicine

## 2015-11-05 ENCOUNTER — Telehealth: Payer: Self-pay

## 2015-11-05 NOTE — Telephone Encounter (Signed)
Writer called patient regarding paperwork being ready for pick up and LVM for patient to return call.  Paperwork faxed and original placed in accordion file for patient pick up.

## 2015-11-05 NOTE — Telephone Encounter (Signed)
Ready for pick up

## 2015-11-06 ENCOUNTER — Encounter: Payer: Self-pay | Admitting: Family

## 2015-11-13 ENCOUNTER — Ambulatory Visit (HOSPITAL_COMMUNITY): Payer: Medicare Other

## 2015-11-13 ENCOUNTER — Ambulatory Visit: Payer: Medicare Other | Admitting: Family

## 2015-11-19 ENCOUNTER — Telehealth: Payer: Self-pay | Admitting: Family Medicine

## 2015-11-19 ENCOUNTER — Other Ambulatory Visit: Payer: Self-pay | Admitting: Pharmacist

## 2015-11-19 DIAGNOSIS — I639 Cerebral infarction, unspecified: Secondary | ICD-10-CM

## 2015-11-19 DIAGNOSIS — E785 Hyperlipidemia, unspecified: Secondary | ICD-10-CM

## 2015-11-19 DIAGNOSIS — M1000 Idiopathic gout, unspecified site: Secondary | ICD-10-CM

## 2015-11-19 DIAGNOSIS — J449 Chronic obstructive pulmonary disease, unspecified: Secondary | ICD-10-CM

## 2015-11-19 DIAGNOSIS — M62838 Other muscle spasm: Secondary | ICD-10-CM

## 2015-11-19 DIAGNOSIS — F0280 Dementia in other diseases classified elsewhere without behavioral disturbance: Secondary | ICD-10-CM

## 2015-11-19 DIAGNOSIS — E1149 Type 2 diabetes mellitus with other diabetic neurological complication: Secondary | ICD-10-CM

## 2015-11-19 DIAGNOSIS — E119 Type 2 diabetes mellitus without complications: Secondary | ICD-10-CM

## 2015-11-19 DIAGNOSIS — K5909 Other constipation: Secondary | ICD-10-CM

## 2015-11-19 DIAGNOSIS — I5032 Chronic diastolic (congestive) heart failure: Secondary | ICD-10-CM

## 2015-11-19 MED ORDER — ALLOPURINOL 300 MG PO TABS: 300.0000 mg | ORAL_TABLET | Freq: Every day | ORAL | 2 refills | Status: DC

## 2015-11-19 NOTE — Telephone Encounter (Signed)
[name] called on behalf of patient to state that, [facility] said that office has not received fax in regards to diabetic shoes. Please refax the prescription Please follow up.

## 2015-11-20 NOTE — Telephone Encounter (Signed)
[name] called the office to speak with nurse regarding pt's diabetic shoes. She stated that the company is dismissing patient's request because they haven't received the paperwork yet. Please follow up.  Thank you.

## 2015-11-21 ENCOUNTER — Telehealth: Payer: Self-pay

## 2015-11-21 NOTE — Telephone Encounter (Signed)
Writer called patient's caregiver back, [name].  She states that Melodie from [place]. Informed her that she has faxed several forms to be filled out for patient to receive his diabetic footwear. Writer tried to call Melodie at [phone] ex-[phone] and LVM asking her to call back, Probation officer also gave her the fax and telephone number for [facility]

## 2015-12-11 ENCOUNTER — Telehealth: Payer: Self-pay | Admitting: Family Medicine

## 2015-12-11 ENCOUNTER — Telehealth: Payer: Self-pay

## 2015-12-11 MED ORDER — ACETAMINOPHEN-CODEINE #3 300-30 MG PO TABS: 1.0000 | ORAL_TABLET | Freq: Three times a day (TID) | ORAL | 1 refills | Status: DC | PRN

## 2015-12-11 NOTE — Telephone Encounter (Signed)
Pt. Called requesting a refill on Tylenol # 3. Please f/u

## 2015-12-11 NOTE — Telephone Encounter (Signed)
Writer called patient to inform him that his tylenol#3 is ready for pick up.  Patient stated understanding.

## 2015-12-11 NOTE — Telephone Encounter (Signed)
[name] pt. Caregiver called requesting to speak with pt. Nurse regarding the demographic that were going to be faxed to [facility].  Pt. Will be getting his DM shoes from [facility]. Please f/u     DoolyZachery Dakins Fax: [phone]

## 2015-12-11 NOTE — Telephone Encounter (Signed)
Ready for pick up

## 2015-12-16 ENCOUNTER — Other Ambulatory Visit: Payer: Medicare Other

## 2015-12-23 ENCOUNTER — Ambulatory Visit (INDEPENDENT_AMBULATORY_CARE_PROVIDER_SITE_OTHER): Payer: Medicare Other

## 2015-12-23 DIAGNOSIS — R600 Localized edema: Secondary | ICD-10-CM

## 2015-12-23 DIAGNOSIS — E119 Type 2 diabetes mellitus without complications: Secondary | ICD-10-CM

## 2015-12-23 DIAGNOSIS — E1151 Type 2 diabetes mellitus with diabetic peripheral angiopathy without gangrene: Secondary | ICD-10-CM

## 2015-12-23 NOTE — Progress Notes (Signed)
Patient presents to be measured for diabetic shoes and inserts.  We will call when shoes and inserts arrive

## 2016-01-06 ENCOUNTER — Other Ambulatory Visit: Payer: Self-pay | Admitting: Pharmacist

## 2016-01-06 MED ORDER — PANTOPRAZOLE SODIUM 40 MG PO TBEC: 40.0000 mg | DELAYED_RELEASE_TABLET | ORAL | 2 refills | Status: DC

## 2016-01-07 ENCOUNTER — Telehealth: Payer: Self-pay | Admitting: Family Medicine

## 2016-01-07 NOTE — Telephone Encounter (Signed)
Patient's PCA [name], states patient had surgery this morning and is in extreme pain. PCA contacted provider who performed surgery and asked for pain medication and were not able to prescribed advised patient to take tylenol. Patient would like to know if something stronger can be prescribed

## 2016-01-08 NOTE — Telephone Encounter (Signed)
He does have Tylenol 3 which was I have given him on 12/11/15

## 2016-01-23 ENCOUNTER — Other Ambulatory Visit: Payer: Self-pay | Admitting: *Deleted

## 2016-01-29 ENCOUNTER — Ambulatory Visit: Payer: Medicare Other | Attending: Family Medicine | Admitting: Family Medicine

## 2016-01-29 ENCOUNTER — Encounter: Payer: Self-pay | Admitting: Family Medicine

## 2016-01-29 VITALS — BP 104/62 | HR 97 | Temp 97.9°F | Ht 75.0 in | Wt 249.2 lb

## 2016-01-29 DIAGNOSIS — I2510 Atherosclerotic heart disease of native coronary artery without angina pectoris: Secondary | ICD-10-CM | POA: Diagnosis not present

## 2016-01-29 DIAGNOSIS — G4700 Insomnia, unspecified: Secondary | ICD-10-CM | POA: Insufficient documentation

## 2016-01-29 DIAGNOSIS — F17200 Nicotine dependence, unspecified, uncomplicated: Secondary | ICD-10-CM | POA: Insufficient documentation

## 2016-01-29 DIAGNOSIS — F32A Depression, unspecified: Secondary | ICD-10-CM | POA: Insufficient documentation

## 2016-01-29 DIAGNOSIS — Z79899 Other long term (current) drug therapy: Secondary | ICD-10-CM | POA: Diagnosis not present

## 2016-01-29 DIAGNOSIS — Z7902 Long term (current) use of antithrombotics/antiplatelets: Secondary | ICD-10-CM | POA: Insufficient documentation

## 2016-01-29 DIAGNOSIS — F419 Anxiety disorder, unspecified: Secondary | ICD-10-CM | POA: Insufficient documentation

## 2016-01-29 DIAGNOSIS — I252 Old myocardial infarction: Secondary | ICD-10-CM | POA: Diagnosis not present

## 2016-01-29 DIAGNOSIS — M62838 Other muscle spasm: Secondary | ICD-10-CM | POA: Diagnosis not present

## 2016-01-29 DIAGNOSIS — J449 Chronic obstructive pulmonary disease, unspecified: Secondary | ICD-10-CM | POA: Diagnosis not present

## 2016-01-29 DIAGNOSIS — Z86718 Personal history of other venous thrombosis and embolism: Secondary | ICD-10-CM | POA: Insufficient documentation

## 2016-01-29 DIAGNOSIS — Z888 Allergy status to other drugs, medicaments and biological substances status: Secondary | ICD-10-CM | POA: Diagnosis not present

## 2016-01-29 DIAGNOSIS — G4709 Other insomnia: Secondary | ICD-10-CM

## 2016-01-29 DIAGNOSIS — G4733 Obstructive sleep apnea (adult) (pediatric): Secondary | ICD-10-CM | POA: Insufficient documentation

## 2016-01-29 DIAGNOSIS — I5032 Chronic diastolic (congestive) heart failure: Secondary | ICD-10-CM | POA: Diagnosis not present

## 2016-01-29 DIAGNOSIS — Z885 Allergy status to narcotic agent status: Secondary | ICD-10-CM | POA: Diagnosis not present

## 2016-01-29 DIAGNOSIS — E7800 Pure hypercholesterolemia, unspecified: Secondary | ICD-10-CM | POA: Insufficient documentation

## 2016-01-29 DIAGNOSIS — M1000 Idiopathic gout, unspecified site: Secondary | ICD-10-CM | POA: Diagnosis not present

## 2016-01-29 DIAGNOSIS — I10 Essential (primary) hypertension: Secondary | ICD-10-CM

## 2016-01-29 DIAGNOSIS — F321 Major depressive disorder, single episode, moderate: Secondary | ICD-10-CM

## 2016-01-29 DIAGNOSIS — Z7982 Long term (current) use of aspirin: Secondary | ICD-10-CM | POA: Insufficient documentation

## 2016-01-29 DIAGNOSIS — E1151 Type 2 diabetes mellitus with diabetic peripheral angiopathy without gangrene: Secondary | ICD-10-CM | POA: Diagnosis not present

## 2016-01-29 DIAGNOSIS — F329 Major depressive disorder, single episode, unspecified: Secondary | ICD-10-CM | POA: Insufficient documentation

## 2016-01-29 DIAGNOSIS — F0280 Dementia in other diseases classified elsewhere without behavioral disturbance: Secondary | ICD-10-CM | POA: Diagnosis not present

## 2016-01-29 DIAGNOSIS — I110 Hypertensive heart disease with heart failure: Secondary | ICD-10-CM | POA: Diagnosis not present

## 2016-01-29 DIAGNOSIS — K219 Gastro-esophageal reflux disease without esophagitis: Secondary | ICD-10-CM | POA: Diagnosis not present

## 2016-01-29 DIAGNOSIS — E119 Type 2 diabetes mellitus without complications: Secondary | ICD-10-CM | POA: Diagnosis present

## 2016-01-29 DIAGNOSIS — E1149 Type 2 diabetes mellitus with other diabetic neurological complication: Secondary | ICD-10-CM | POA: Diagnosis not present

## 2016-01-29 DIAGNOSIS — Z8673 Personal history of transient ischemic attack (TIA), and cerebral infarction without residual deficits: Secondary | ICD-10-CM | POA: Diagnosis not present

## 2016-01-29 LAB — POCT GLYCOSYLATED HEMOGLOBIN (HGB A1C): HEMOGLOBIN A1C: 6.1

## 2016-01-29 LAB — GLUCOSE, POCT (MANUAL RESULT ENTRY): POC GLUCOSE: 70 mg/dL (ref 70–99)

## 2016-01-29 MED ORDER — ALBUTEROL SULFATE HFA 108 (90 BASE) MCG/ACT IN AERS: 2.0000 | INHALATION_SPRAY | Freq: Four times a day (QID) | RESPIRATORY_TRACT | 3 refills | Status: DC | PRN

## 2016-01-29 MED ORDER — ATORVASTATIN CALCIUM 40 MG PO TABS: 40.0000 mg | ORAL_TABLET | Freq: Every day | ORAL | 5 refills | Status: DC

## 2016-01-29 MED ORDER — MEMANTINE HCL 10 MG PO TABS: 10.0000 mg | ORAL_TABLET | Freq: Two times a day (BID) | ORAL | 3 refills | Status: DC

## 2016-01-29 MED ORDER — PANTOPRAZOLE SODIUM 40 MG PO TBEC: 40.0000 mg | DELAYED_RELEASE_TABLET | ORAL | 2 refills | Status: DC

## 2016-01-29 MED ORDER — CARVEDILOL 25 MG PO TABS: 25.0000 mg | ORAL_TABLET | Freq: Two times a day (BID) | ORAL | 3 refills | Status: DC

## 2016-01-29 MED ORDER — TRAZODONE HCL 50 MG PO TABS: 25.0000 mg | ORAL_TABLET | Freq: Every evening | ORAL | 3 refills | Status: DC | PRN

## 2016-01-29 MED ORDER — GLUCOSE BLOOD VI STRP: ORAL_STRIP | 12 refills | Status: DC

## 2016-01-29 MED ORDER — FLUOXETINE HCL 20 MG PO TABS: 20.0000 mg | ORAL_TABLET | Freq: Every day | ORAL | 3 refills | Status: DC

## 2016-01-29 MED ORDER — CLOPIDOGREL BISULFATE 75 MG PO TABS: 75.0000 mg | ORAL_TABLET | Freq: Every day | ORAL | 3 refills | Status: DC

## 2016-01-29 MED ORDER — GABAPENTIN 300 MG PO CAPS: 600.0000 mg | ORAL_CAPSULE | Freq: Three times a day (TID) | ORAL | 3 refills | Status: DC

## 2016-01-29 MED ORDER — TIZANIDINE HCL 4 MG PO TABS: 4.0000 mg | ORAL_TABLET | Freq: Three times a day (TID) | ORAL | 3 refills | Status: DC | PRN

## 2016-01-29 MED ORDER — ALLOPURINOL 300 MG PO TABS: 300.0000 mg | ORAL_TABLET | Freq: Every day | ORAL | 2 refills | Status: DC

## 2016-01-29 MED ORDER — ACETAMINOPHEN-CODEINE #3 300-30 MG PO TABS: 1.0000 | ORAL_TABLET | Freq: Three times a day (TID) | ORAL | 1 refills | Status: DC | PRN

## 2016-01-29 MED ORDER — FUROSEMIDE 40 MG PO TABS: 60.0000 mg | ORAL_TABLET | Freq: Two times a day (BID) | ORAL | 3 refills | Status: DC

## 2016-01-29 MED ORDER — ALBUTEROL SULFATE (2.5 MG/3ML) 0.083% IN NEBU: 2.5000 mg | INHALATION_SOLUTION | Freq: Four times a day (QID) | RESPIRATORY_TRACT | 3 refills | Status: DC | PRN

## 2016-01-29 NOTE — Progress Notes (Signed)
Subjective:  Patient ID: [name], male    DOB: [birth year]  Age: 65 y.o. MRN: [id number]  CC: Diabetes; Hypertension; and COPD   HPI [name] is a 65 year old male with a history of HTN, CHF (EF 50-55% from 2-D echo of 10/2015), COPD, CVA with mild residual left hemiparesis, hyperlipidemia, gout, dementia,PVD here for a follow up visit.  He is accompanied by his PCS worker who states that he has had occasions of elevated blood pressures in the 200s and at other times his blood pressure has been low in the low 100s. The patient has also expressed at certain times that "he did not feel like taking his medications".  He complains of insomnia and occasionally feels depressed due to multiple medical conditions. Denies suicidal ideations or intentions.  Continues to complain of pain in his calf for which she takes Tylenol 3. He has not been to see his vascular surgeon with [facility] lately but will be having an appointment in the new year. He continues to smoke and is not ready to quit. He denies any recent gout flare.  He recently had excision of a basal cell carcinoma on the left side of his face and reports doing well.  Past Medical History:  Diagnosis Date  . Alcohol abuse    H/O  . Anxiety   . Arthritis   . Asthma   . CAD (coronary artery disease) 05/27/10   Cath: severe single vessell CAD left cx midportion obtuse marginal 2 to 3.  [name] CHF (congestive heart failure) ([name])   . COPD (chronic obstructive pulmonary disease) ([place])   . COPD (chronic obstructive pulmonary disease) ([place])   . Depression   . Diabetes mellitus, type 2 ([place]) 03/13/2015  . GERD (gastroesophageal reflux disease)   . HCAP (healthcare-associated pneumonia) 10/15/2015  . History of DVT of lower extremity   . Hyperlipemia   . Hyperlipidemia   . Hypertension   . Myocardial infarction 2000  . Orbital fracture ([place]) 12/2012  . OSA on CPAP   . Peripheral vascular disease,  unspecified    08/20/10 doppler: increase in right ABI post-op. Left ABI stable. S/P bi-fem bypass surgery  . Pneumonia 04/04/2012  . Shortness of breath   . Stroke ([place])   . Tobacco abuse     Past Surgical History:  Procedure Laterality Date  . abdoninal ao angio & bifem angio  05/27/10   Patent graft, occluded bil stents with no retrograde flow into the hypogastric arteries. 100% occl left ant. tibial artery, 70% to 80% to 100% stenosis right superficial fem artery above adductor canal. 100 % occl right ant. tibial vessell  . Aortogram w/ PTCA  09/292003  . BACK SURGERY    . CARDIAC CATHETERIZATION  05/27/10   severe CAD left cx  . CHOLECYSTECTOMY    . ESOPHAGOGASTRIC FUNDOPLICATION    . EYE SURGERY    . FEMORAL BYPASS  08/19/10   Right Fem-Pop  . JOINT REPLACEMENT    . PERIPHERAL VASCULAR CATHETERIZATION N/A 12/10/2014   Procedure: Abdominal Aortogram;  Surgeon: Angelia Mould, MD;  Location: [facility];  Service: Cardiovascular;  Laterality: N/A;  . RADIOLOGY WITH ANESTHESIA N/A 02/08/2015   Procedure: RADIOLOGY WITH ANESTHESIA;  Surgeon: Luanne Bras, MD;  Location: [facility];  Service: Radiology;  Laterality: N/A;  . TEE WITHOUT CARDIOVERSION N/A 08/29/2014   Procedure: TRANSESOPHAGEAL ECHOCARDIOGRAM (TEE);  Surgeon: Josue Hector, MD;  Location: [facility];  Service: Cardiovascular;  Laterality: N/A;  . TOTAL KNEE ARTHROPLASTY      Allergies  Allergen Reactions  . Darvocet [Propoxyphene N-Acetaminophen] Hives  . Haldol [Haloperidol Decanoate] Hives  . Acetaminophen Nausea Only    Upset stomach, tolerates Hydrocodone/APAP if taken with food  . Metformin And Related Nausea And Vomiting  . Norco [Hydrocodone-Acetaminophen] Nausea And Vomiting    Tolerates if taken with food     Outpatient Medications Prior to Visit  Medication Sig Dispense Refill  . ACCU-CHEK SOFTCLIX LANCETS lancets Use 3 times daily before meals 100 each 5  . aspirin 325 MG EC tablet  Take 1 tablet (325 mg total) by mouth daily. 30 tablet 0  . Blood Glucose Monitoring Suppl (ACCU-CHEK AVIVA) device Use 3 times daily before meals. 1 each 0  . colchicine 0.6 MG tablet Take 2 tabs (1.2 mg) at the onset of a gout attack, may repeat 1 tablet (0.6 mg) in one hour if symptoms persist 30 tablet 2  . lactulose (CHRONULAC) 10 GM/15ML solution Take 15 mLs (10 g total) by mouth 2 (two) times daily as needed for mild constipation. 946 mL 2  . Lancet Devices (ACCU-CHEK SOFTCLIX) lancets Use 3 times daily before meals 1 each 5  . Misc. Devices (ROLLATOR) MISC 1 each by Does not apply route daily. 1 each 0  . triamcinolone cream (KENALOG) 0.1 % Apply 1 application topically daily as needed (for dermatitis).     [name] albuterol (PROVENTIL HFA;VENTOLIN HFA) 108 (90 Base) MCG/ACT inhaler Inhale 2 puffs into the lungs every 6 (six) hours as needed for wheezing or shortness of breath. 1 Inhaler 3  . albuterol (PROVENTIL) (2.5 MG/3ML) 0.083% nebulizer solution Take 3 mLs (2.5 mg total) by nebulization every 6 (six) hours as needed for wheezing or shortness of breath. 75 mL 3  . allopurinol (ZYLOPRIM) 300 MG tablet Take 1 tablet (300 mg total) by mouth daily. 30 tablet 2  . atorvastatin (LIPITOR) 40 MG tablet Take 40 mg by mouth every morning.     . carvedilol (COREG) 25 MG tablet Take 1 tablet (25 mg total) by mouth 2 (two) times daily with a meal. 60 tablet 3  . clopidogrel (PLAVIX) 75 MG tablet Take 1 tablet (75 mg total) by mouth daily. 30 tablet 3  . furosemide (LASIX) 40 MG tablet Take 1.5 tablets (60 mg total) by mouth 2 (two) times daily. 90 tablet 3  . gabapentin (NEURONTIN) 300 MG capsule Take 2 capsules (600 mg total) by mouth 3 (three) times daily. 180 capsule 3  . glucose blood (ACCU-CHEK AVIVA) test strip Use 3 times daily before meals as directed. 100 each 12  . memantine (NAMENDA) 10 MG tablet Take 1 tablet (10 mg total) by mouth 2 (two) times daily. 60 tablet 3  . pantoprazole (PROTONIX)  40 MG tablet Take 1 tablet (40 mg total) by mouth every morning. 30 tablet 2  . tiZANidine (ZANAFLEX) 4 MG tablet Take 1 tablet (4 mg total) by mouth every 8 (eight) hours as needed for muscle spasms. (Patient taking differently: Take 4 mg by mouth every 6 (six) hours as needed for muscle spasms. ) 90 tablet 3  . acetaminophen-codeine (TYLENOL #3) 300-30 MG tablet Take 1 tablet by mouth every 8 (eight) hours as needed for moderate pain. (Patient not taking: Reported on 01/29/2016) 60 tablet 1   No facility-administered medications prior to visit.     ROS Review of Systems Constitutional: Negative for activity change and appetite change.  HENT: Negative for sinus pressure and sore throat.   Eyes: Negative for visual disturbance.  Respiratory: Negative for cough, chest tightness and shortness of breath.   Cardiovascular: Positive for leg swelling and pain . Negative for chest pain.  Gastrointestinal: Negative for abdominal distention, abdominal pain, constipation and diarrhea.  Endocrine: Negative.   Genitourinary: Negative for dysuria.  Musculoskeletal: positive for  for joint swelling and myalgias.  Skin: Negative for rash.  Allergic/Immunologic: Negative.   Neurological: Negative for weakness, light-headedness and numbness.  Psychiatric/Behavioral: positive for dysphoric mood and negative for suicidal ideas.   Objective:  BP 104/62 (BP Location: Right Arm, Patient Position: Sitting, Cuff Size: Large)   Pulse 97   Temp 97.9 F (36.6 C) (Oral)   Ht '6\' 3"'$  (1.905 m)   Wt 249 lb 3.2 oz (113 kg)   SpO2 93%   BMI 31.15 kg/m   BP/Weight 01/29/2016 10/15/6211 0/09/6576  Systolic BP 469 629 -  Diastolic BP 62 73 -  Wt. (Lbs) 249.2 - 256.2  BMI 31.15 - 34.75      Physical Exam Constitutional: He is oriented to person, place, and time. He appears well-developed and well-nourished.  Neck: No JVD present. No tracheal deviation present.  Cardiovascular: Normal rate, regular rhythm and  normal heart sounds.   No murmur heard. Unable to palpate dorsalis pedis pulses due to edema  Pulmonary/Chest: Effort normal and breath sounds normal. No respiratory distress. He has no wheezes. He exhibits no tenderness.  Abdominal: Soft. Bowel sounds are normal. He exhibits no mass. There is no tenderness.  Musculoskeletal: Normal range of motion. He exhibits edema (bilateral nonpitting dorsal edema)  Mild tenderness on palpation of left side of back.  Neurological: He is alert and oriented to person, place, and time.  Skin: Scaly psoriasiform rash on extensor aspect of both fore arms; erythema of both lower extremity Psychiatric: He has a normal mood and affect.   CMP Latest Ref Rng & Units 10/16/2015 10/16/2015 10/15/2015  Glucose 65 - 99 mg/dL 111(H) 129(H) 222(H)  BUN 6 - 20 mg/dL '9 10 8  '$ Creatinine 0.61 - 1.24 mg/dL 0.98 0.94 1.50(H)  Sodium 135 - 145 mmol/L 146(H) 144 141  Potassium 3.5 - 5.1 mmol/L 5.0 3.1(L) 3.6  Chloride 101 - 111 mmol/L 112(H) 106 102  CO2 22 - 32 mmol/L 31 26 -  Calcium 8.9 - 10.3 mg/dL 9.3 8.8(L) -  Total Protein 6.5 - 8.1 g/dL - - -  Total Bilirubin 0.3 - 1.2 mg/dL - - -  Alkaline Phos 38 - 126 U/L - - -  AST 15 - 41 U/L - - -  ALT 17 - 63 U/L - - -    Lipid Panel     Component Value Date/Time   CHOL 233 (H) 09/27/2015 1208   TRIG 146 09/27/2015 1208   HDL 48 09/27/2015 1208   CHOLHDL 4.9 09/27/2015 1208   VLDL 29 09/27/2015 1208   LDLCALC 156 (H) 09/27/2015 1208    Lab Results  Component Value Date   HGBA1C 6.1 01/29/2016    Assessment & Plan:   1. Muscle spasm Stable - tiZANidine (ZANAFLEX) 4 MG tablet; Take 1 tablet (4 mg total) by mouth every 8 (eight) hours as needed for muscle spasms.  Dispense: 90 tablet; Refill: 3 - acetaminophen-codeine (TYLENOL #3) 300-30 MG tablet; Take 1 tablet by mouth every 8 (eight) hours as needed for moderate pain.  Dispense: 60 tablet; Refill: 1  2. Chronic obstructive pulmonary disease, unspecified COPD  type (HCC) Stable - albuterol (PROVENTIL HFA;VENTOLIN HFA) 108 (90 Base) MCG/ACT inhaler; Inhale 2 puffs into the lungs every 6 (six) hours as needed for wheezing or shortness of breath.  Dispense: 1 Inhaler; Refill: 3 - albuterol (PROVENTIL) (2.5 MG/3ML) 0.083% nebulizer solution; Take 3 mLs (2.5 mg total) by nebulization every 6 (six) hours as needed for wheezing or shortness of breath.  Dispense: 75 mL; Refill: 3  3. Idiopathic gout, unspecified chronicity, unspecified site No acute flare - allopurinol (ZYLOPRIM) 300 MG tablet; Take 1 tablet (300 mg total) by mouth daily.  Dispense: 30 tablet; Refill: 2  4. Pure hypercholesterolemia Uncontrolled Low-cholesterol diet Lifestyle modifications - atorvastatin (LIPITOR) 40 MG tablet; Take 1 tablet (40 mg total) by mouth daily at 6 PM.  Dispense: 30 tablet; Refill: 5  5. Chronic diastolic congestive heart failure (HCC) EF 55-60% from 2-D echo 10/2015  Euvolemic - furosemide (LASIX) 40 MG tablet; Take 1.5 tablets (60 mg total) by mouth 2 (two) times daily.  Dispense: 90 tablet; Refill: 3  6. History of stroke Stable Risk factor modification - clopidogrel (PLAVIX) 75 MG tablet; Take 1 tablet (75 mg total) by mouth daily.  Dispense: 30 tablet; Refill: 3  7. Type 2 diabetes mellitus with other neurologic complication, without long-term current use of insulin (HCC) Diet controlled with A1c of 6.1 - Glucose (CBG) - HgB A1c - glucose blood (ACCU-CHEK AVIVA) test strip; Use 3 times daily before meals as directed.  Dispense: 100 each; Refill: 12 - gabapentin (NEURONTIN) 300 MG capsule; Take 2 capsules (600 mg total) by mouth 3 (three) times daily.  Dispense: 180 capsule; Refill: 3  8. Dementia due to another medical condition, without behavioral disturbance Stable - memantine (NAMENDA) 10 MG tablet; Take 1 tablet (10 mg total) by mouth 2 (two) times daily.  Dispense: 60 tablet; Refill: 3  9. Other insomnia Commenced on trazodone - traZODone  (DESYREL) 50 MG tablet; Take 0.5-1 tablets (25-50 mg total) by mouth at bedtime as needed for sleep.  Dispense: 30 tablet; Refill: 3  10. Moderate single current episode of major depressive disorder ([place]) Probably due to underlying medical conditions He is refusing referral for therapy - FLUoxetine (PROZAC) 20 MG tablet; Take 1 tablet (20 mg total) by mouth daily.  Dispense: 30 tablet; Refill: 3  11. Essential hypertension Blood pressure is normal today Fluctuations in blood pressure at home likely secondary to noncompliance as sometimes the patient does not feel like taking his antihypertensives Low-sodium diet Strongly emphasized importance of compliance - carvedilol (COREG) 25 MG tablet; Take 1 tablet (25 mg total) by mouth 2 (two) times daily with a meal.  Dispense: 60 tablet; Refill: 3   Meds ordered this encounter  Medications  . glucose blood (ACCU-CHEK AVIVA) test strip    Sig: Use 3 times daily before meals as directed.    Dispense:  100 each    Refill:  12  . tiZANidine (ZANAFLEX) 4 MG tablet    Sig: Take 1 tablet (4 mg total) by mouth every 8 (eight) hours as needed for muscle spasms.    Dispense:  90 tablet    Refill:  3  . pantoprazole (PROTONIX) 40 MG tablet    Sig: Take 1 tablet (40 mg total) by mouth every morning.    Dispense:  30 tablet    Refill:  2  . memantine (NAMENDA) 10 MG tablet    Sig: Take 1 tablet (10 mg total) by mouth 2 (two) times daily.  Dispense:  60 tablet    Refill:  3  . gabapentin (NEURONTIN) 300 MG capsule    Sig: Take 2 capsules (600 mg total) by mouth 3 (three) times daily.    Dispense:  180 capsule    Refill:  3  . furosemide (LASIX) 40 MG tablet    Sig: Take 1.5 tablets (60 mg total) by mouth 2 (two) times daily.    Dispense:  90 tablet    Refill:  3  . clopidogrel (PLAVIX) 75 MG tablet    Sig: Take 1 tablet (75 mg total) by mouth daily.    Dispense:  30 tablet    Refill:  3  . carvedilol (COREG) 25 MG tablet    Sig: Take 1  tablet (25 mg total) by mouth 2 (two) times daily with a meal.    Dispense:  60 tablet    Refill:  3  . allopurinol (ZYLOPRIM) 300 MG tablet    Sig: Take 1 tablet (300 mg total) by mouth daily.    Dispense:  30 tablet    Refill:  2  . albuterol (PROVENTIL HFA;VENTOLIN HFA) 108 (90 Base) MCG/ACT inhaler    Sig: Inhale 2 puffs into the lungs every 6 (six) hours as needed for wheezing or shortness of breath.    Dispense:  1 Inhaler    Refill:  3  . albuterol (PROVENTIL) (2.5 MG/3ML) 0.083% nebulizer solution    Sig: Take 3 mLs (2.5 mg total) by nebulization every 6 (six) hours as needed for wheezing or shortness of breath.    Dispense:  75 mL    Refill:  3  . acetaminophen-codeine (TYLENOL #3) 300-30 MG tablet    Sig: Take 1 tablet by mouth every 8 (eight) hours as needed for moderate pain.    Dispense:  60 tablet    Refill:  1  . atorvastatin (LIPITOR) 40 MG tablet    Sig: Take 1 tablet (40 mg total) by mouth daily at 6 PM.    Dispense:  30 tablet    Refill:  5  . traZODone (DESYREL) 50 MG tablet    Sig: Take 0.5-1 tablets (25-50 mg total) by mouth at bedtime as needed for sleep.    Dispense:  30 tablet    Refill:  3  . FLUoxetine (PROZAC) 20 MG tablet    Sig: Take 1 tablet (20 mg total) by mouth daily.    Dispense:  30 tablet    Refill:  3    Follow-up: Return in about 3 months (around 04/28/2016) for follow up on Depression.   Arnoldo Morale MD

## 2016-02-20 ENCOUNTER — Other Ambulatory Visit: Payer: Self-pay | Admitting: Family Medicine

## 2016-02-20 DIAGNOSIS — M62838 Other muscle spasm: Secondary | ICD-10-CM

## 2016-02-21 ENCOUNTER — Telehealth: Payer: Self-pay | Admitting: Family Medicine

## 2016-02-21 NOTE — Telephone Encounter (Signed)
Pt. Care taker called requesting to speak with pt. Nurse stating that pt. Has not received his diabetic shoes. She states that it has been over three months and wanted to let pt. Nurse know. Please f/u

## 2016-02-25 ENCOUNTER — Telehealth: Payer: Self-pay | Admitting: Family Medicine

## 2016-02-25 NOTE — Telephone Encounter (Signed)
Mrs [name] care giver is asking if Dr [name] can prescribe something for [name] he is having bad congestion,headache,body aches , diarrhea & vomiting   and the hospital is asking people not to go there and he use [facility] . Thank You

## 2016-03-12 NOTE — Telephone Encounter (Signed)
Writer called Melody several times at [facility] and has no luck having her call back.  MD received another form to fill out for patient's diabetic shoes.   Writer LVM with [name] updating her on the status of patient's diabetic shoes.

## 2016-03-19 ENCOUNTER — Telehealth: Payer: Self-pay | Admitting: Family Medicine

## 2016-03-19 NOTE — Telephone Encounter (Signed)
[name], pts PCA calling to inform nurse that pts diabetic shoes are in but they need an authorization sent to [facility]   Requesting a call back, thank you

## 2016-03-19 NOTE — Telephone Encounter (Signed)
[name] called the office to speak with PCP regarding pt's diabetic shoe. [name] expressed that patient doesn't want authorization form (for diabetic shoe) to be sent to [facility]. Pt is not happy that process is taking over 6 months. Please follow up.  Thank you.

## 2016-03-20 NOTE — Telephone Encounter (Signed)
Writer has attempted to call [name] back as well as the [employer] who are having a staff mtg this afternoon.  Writer was unable to LVM and will try back at a later time.

## 2016-03-23 NOTE — Telephone Encounter (Signed)
Faxed approval letter to [employer].  Notified [name] by VM.

## 2016-03-24 ENCOUNTER — Telehealth: Payer: Self-pay | Admitting: Family Medicine

## 2016-03-24 NOTE — Telephone Encounter (Signed)
Caller states that patient's shoes are ready for pickup and they are just waiting for Dr [name] to sign an authorization. Said she spoke to [place] a couple of weeks ago about this matter and would like to know when to expect the authorization. Please f/u.

## 2016-03-25 ENCOUNTER — Ambulatory Visit (INDEPENDENT_AMBULATORY_CARE_PROVIDER_SITE_OTHER): Payer: Medicare Other | Admitting: Podiatry

## 2016-03-25 ENCOUNTER — Other Ambulatory Visit: Payer: Self-pay | Admitting: Family Medicine

## 2016-03-25 DIAGNOSIS — E1151 Type 2 diabetes mellitus with diabetic peripheral angiopathy without gangrene: Secondary | ICD-10-CM | POA: Diagnosis not present

## 2016-03-25 DIAGNOSIS — I739 Peripheral vascular disease, unspecified: Secondary | ICD-10-CM | POA: Diagnosis not present

## 2016-03-25 DIAGNOSIS — E1140 Type 2 diabetes mellitus with diabetic neuropathy, unspecified: Secondary | ICD-10-CM | POA: Diagnosis not present

## 2016-03-25 DIAGNOSIS — E1149 Type 2 diabetes mellitus with other diabetic neurological complication: Secondary | ICD-10-CM

## 2016-03-25 DIAGNOSIS — Q828 Other specified congenital malformations of skin: Secondary | ICD-10-CM

## 2016-03-25 DIAGNOSIS — F0280 Dementia in other diseases classified elsewhere without behavioral disturbance: Secondary | ICD-10-CM

## 2016-03-25 DIAGNOSIS — E785 Hyperlipidemia, unspecified: Secondary | ICD-10-CM

## 2016-03-25 NOTE — Progress Notes (Signed)
Patient presents for diabetic shoe pick up, shoes are tried on for good fit.  Patient received 1 Pair and 3 pairs custom molded diabetic inserts.  Verbal and written break in and wear instructions given.  Patient will follow up for scheduled routine care.   

## 2016-03-26 IMAGING — CR DG CHEST 2V
2 series · 2 of 2 positions shown · non-contrast
Comparison: 06/18/2014 and 06/05/2013 as well as 04/09/2013

CLINICAL DATA: Chest pain 2 months.

EXAM:
CHEST  2 VIEW

[w chest pa]
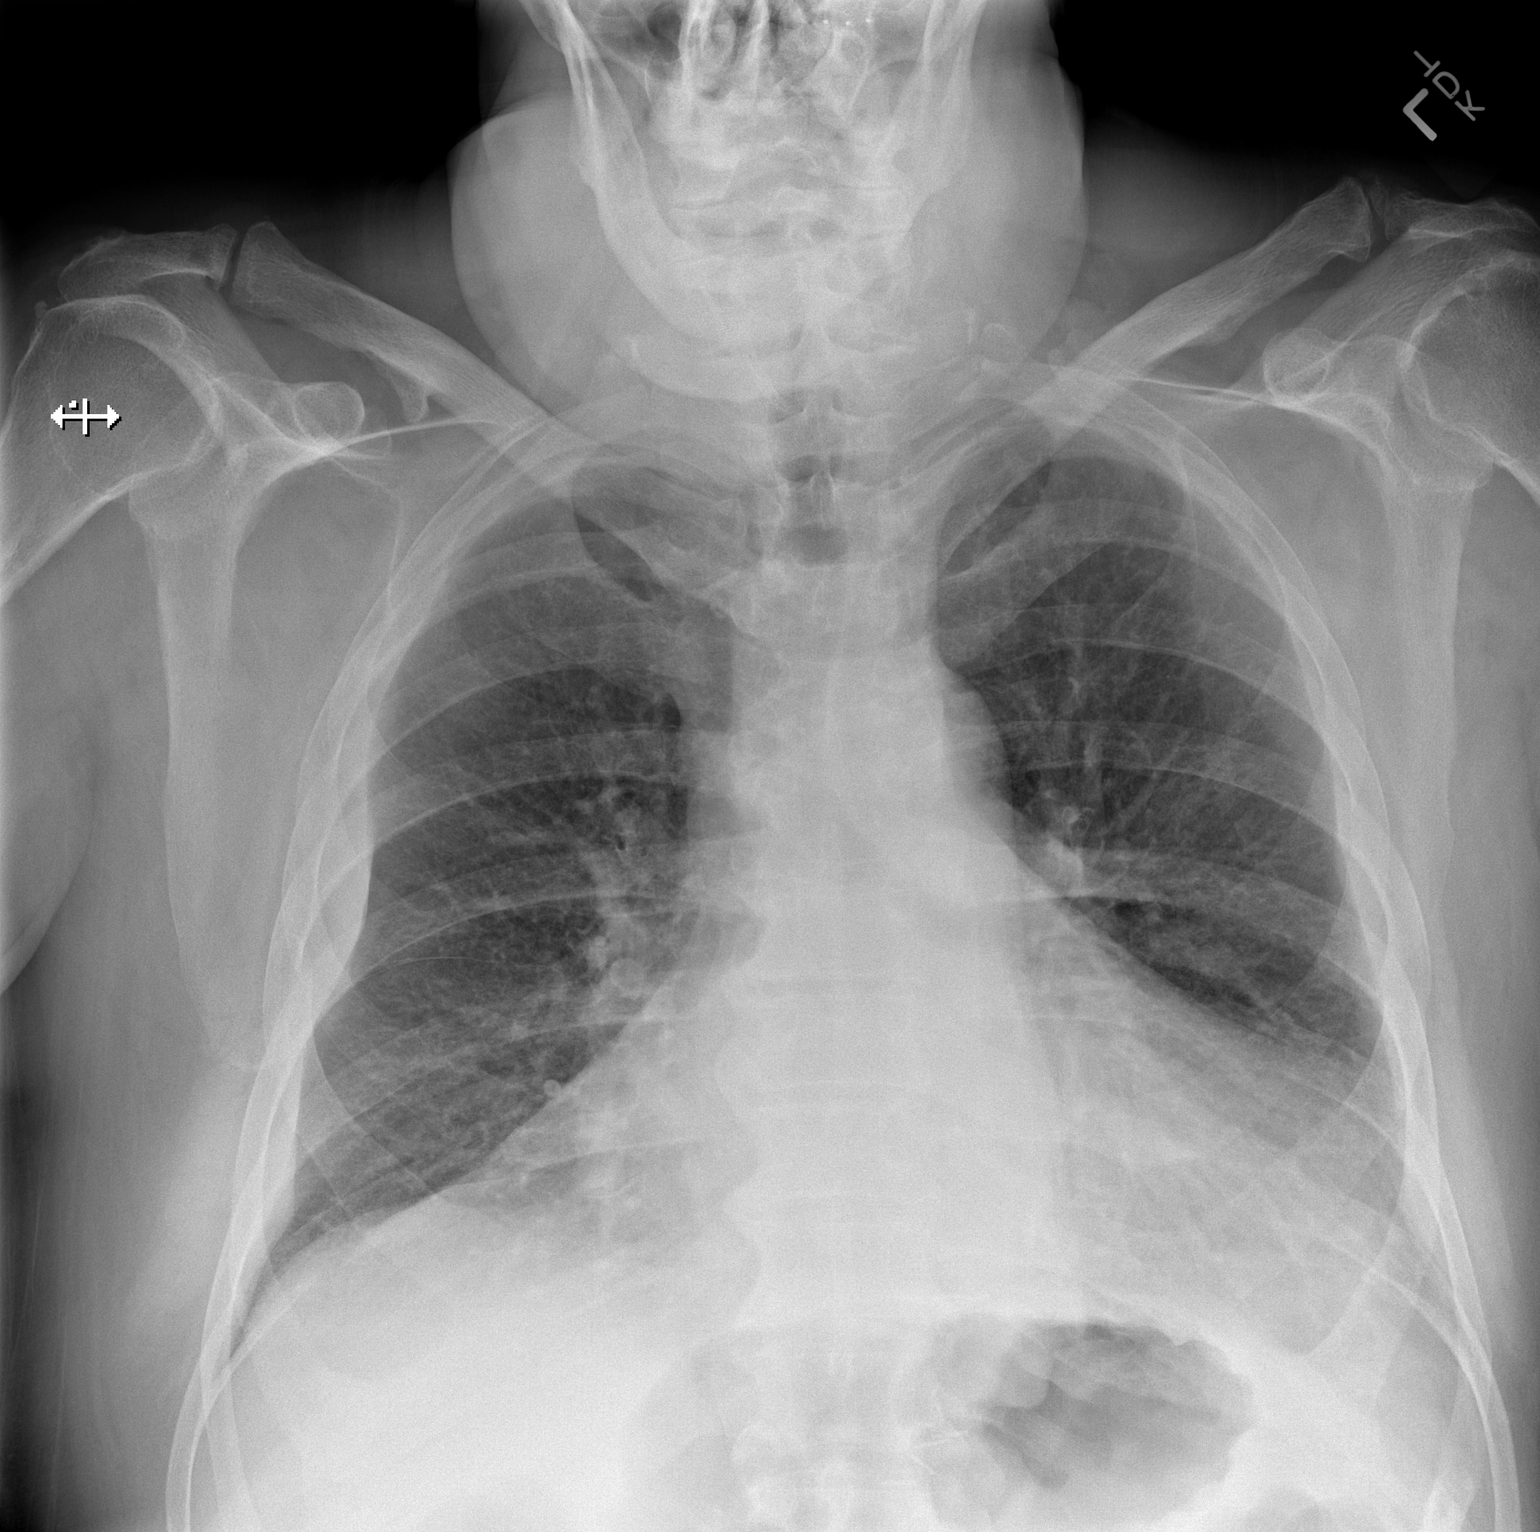

[w chest lat]
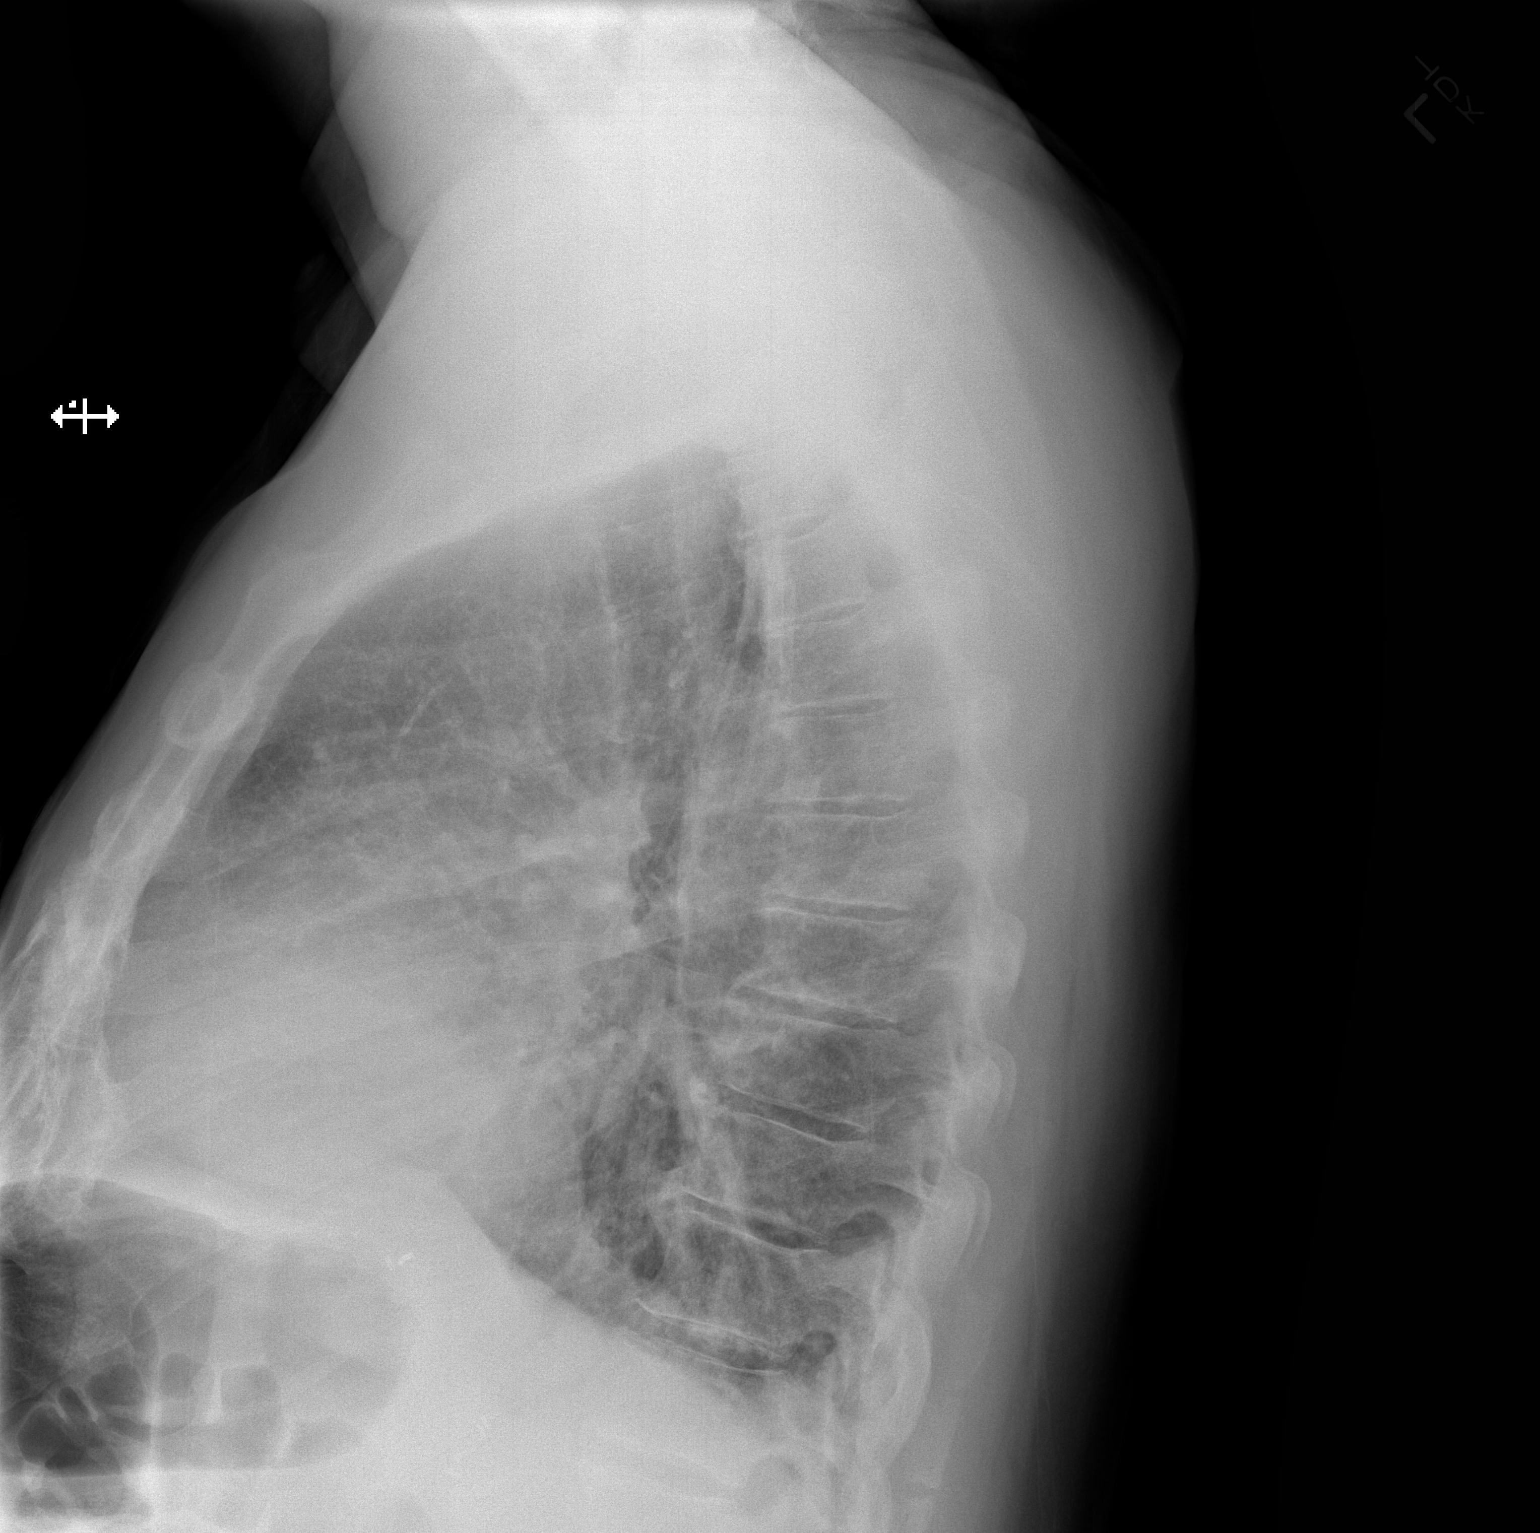

[2 of 2 positions shown; findings below may reference images not displayed]

FINDINGS: Lungs are adequately inflated without focal consolidation or
effusion. Mild flattening of the hemidiaphragms. Stable smooth
pleural thickening over the right lateral thorax likely related to
previous rib fracture. Stable cardiomegaly. Degenerative change of
the spine.
IMPRESSION: No acute cardiopulmonary disease.

Stable cardiomegaly.

## 2016-03-30 ENCOUNTER — Ambulatory Visit: Payer: Medicare Other | Admitting: Podiatry

## 2016-04-02 ENCOUNTER — Other Ambulatory Visit: Payer: Self-pay | Admitting: Family Medicine

## 2016-04-02 DIAGNOSIS — J449 Chronic obstructive pulmonary disease, unspecified: Secondary | ICD-10-CM

## 2016-04-02 NOTE — Telephone Encounter (Signed)
Writer spoke with [name] patient's caregiver, patient finally received his diabetic shoes and he is very happy with them.

## 2016-04-28 ENCOUNTER — Encounter: Payer: Self-pay | Admitting: Family Medicine

## 2016-04-28 ENCOUNTER — Ambulatory Visit: Payer: Medicare Other | Attending: Family Medicine | Admitting: Family Medicine

## 2016-04-28 VITALS — BP 168/81 | HR 67 | Temp 97.4°F | Ht 72.0 in | Wt 234.6 lb

## 2016-04-28 DIAGNOSIS — M62838 Other muscle spasm: Secondary | ICD-10-CM

## 2016-04-28 DIAGNOSIS — J449 Chronic obstructive pulmonary disease, unspecified: Secondary | ICD-10-CM | POA: Diagnosis not present

## 2016-04-28 DIAGNOSIS — I2510 Atherosclerotic heart disease of native coronary artery without angina pectoris: Secondary | ICD-10-CM | POA: Insufficient documentation

## 2016-04-28 DIAGNOSIS — G4733 Obstructive sleep apnea (adult) (pediatric): Secondary | ICD-10-CM | POA: Diagnosis not present

## 2016-04-28 DIAGNOSIS — Z8673 Personal history of transient ischemic attack (TIA), and cerebral infarction without residual deficits: Secondary | ICD-10-CM | POA: Diagnosis not present

## 2016-04-28 DIAGNOSIS — E7800 Pure hypercholesterolemia, unspecified: Secondary | ICD-10-CM

## 2016-04-28 DIAGNOSIS — Z86718 Personal history of other venous thrombosis and embolism: Secondary | ICD-10-CM | POA: Diagnosis not present

## 2016-04-28 DIAGNOSIS — Z888 Allergy status to other drugs, medicaments and biological substances status: Secondary | ICD-10-CM | POA: Insufficient documentation

## 2016-04-28 DIAGNOSIS — Z885 Allergy status to narcotic agent status: Secondary | ICD-10-CM | POA: Insufficient documentation

## 2016-04-28 DIAGNOSIS — F419 Anxiety disorder, unspecified: Secondary | ICD-10-CM | POA: Insufficient documentation

## 2016-04-28 DIAGNOSIS — K5909 Other constipation: Secondary | ICD-10-CM | POA: Diagnosis not present

## 2016-04-28 DIAGNOSIS — I10 Essential (primary) hypertension: Secondary | ICD-10-CM

## 2016-04-28 DIAGNOSIS — E1149 Type 2 diabetes mellitus with other diabetic neurological complication: Secondary | ICD-10-CM | POA: Diagnosis not present

## 2016-04-28 DIAGNOSIS — I5032 Chronic diastolic (congestive) heart failure: Secondary | ICD-10-CM | POA: Diagnosis not present

## 2016-04-28 DIAGNOSIS — E1151 Type 2 diabetes mellitus with diabetic peripheral angiopathy without gangrene: Secondary | ICD-10-CM | POA: Diagnosis not present

## 2016-04-28 DIAGNOSIS — F321 Major depressive disorder, single episode, moderate: Secondary | ICD-10-CM

## 2016-04-28 DIAGNOSIS — E785 Hyperlipidemia, unspecified: Secondary | ICD-10-CM | POA: Diagnosis not present

## 2016-04-28 DIAGNOSIS — Z7982 Long term (current) use of aspirin: Secondary | ICD-10-CM | POA: Diagnosis not present

## 2016-04-28 DIAGNOSIS — Z79899 Other long term (current) drug therapy: Secondary | ICD-10-CM | POA: Diagnosis not present

## 2016-04-28 DIAGNOSIS — I252 Old myocardial infarction: Secondary | ICD-10-CM | POA: Diagnosis not present

## 2016-04-28 DIAGNOSIS — G4709 Other insomnia: Secondary | ICD-10-CM

## 2016-04-28 DIAGNOSIS — Z7902 Long term (current) use of antithrombotics/antiplatelets: Secondary | ICD-10-CM | POA: Insufficient documentation

## 2016-04-28 DIAGNOSIS — M1000 Idiopathic gout, unspecified site: Secondary | ICD-10-CM

## 2016-04-28 DIAGNOSIS — F0280 Dementia in other diseases classified elsewhere without behavioral disturbance: Secondary | ICD-10-CM | POA: Diagnosis not present

## 2016-04-28 DIAGNOSIS — K219 Gastro-esophageal reflux disease without esophagitis: Secondary | ICD-10-CM | POA: Diagnosis not present

## 2016-04-28 DIAGNOSIS — I110 Hypertensive heart disease with heart failure: Secondary | ICD-10-CM | POA: Insufficient documentation

## 2016-04-28 LAB — GLUCOSE, POCT (MANUAL RESULT ENTRY): POC Glucose: 123 mg/dl — AB (ref 70–99)

## 2016-04-28 LAB — POCT GLYCOSYLATED HEMOGLOBIN (HGB A1C): Hemoglobin A1C: 5.7

## 2016-04-28 MED ORDER — TRIAMCINOLONE ACETONIDE 0.1 % EX CREA: 1.0000 "application " | TOPICAL_CREAM | Freq: Every day | CUTANEOUS | 1 refills | Status: DC | PRN

## 2016-04-28 MED ORDER — ALLOPURINOL 300 MG PO TABS: 300.0000 mg | ORAL_TABLET | Freq: Every day | ORAL | 3 refills | Status: DC

## 2016-04-28 MED ORDER — LISINOPRIL 10 MG PO TABS: 10.0000 mg | ORAL_TABLET | Freq: Every day | ORAL | 3 refills | Status: DC

## 2016-04-28 MED ORDER — CARVEDILOL 25 MG PO TABS: 25.0000 mg | ORAL_TABLET | Freq: Two times a day (BID) | ORAL | 3 refills | Status: DC

## 2016-04-28 MED ORDER — ALBUTEROL SULFATE (2.5 MG/3ML) 0.083% IN NEBU: 2.5000 mg | INHALATION_SOLUTION | Freq: Four times a day (QID) | RESPIRATORY_TRACT | 3 refills | Status: DC | PRN

## 2016-04-28 MED ORDER — TRAZODONE HCL 50 MG PO TABS: 50.0000 mg | ORAL_TABLET | Freq: Every evening | ORAL | 3 refills | Status: DC | PRN

## 2016-04-28 MED ORDER — TIZANIDINE HCL 4 MG PO TABS: 4.0000 mg | ORAL_TABLET | Freq: Three times a day (TID) | ORAL | 3 refills | Status: DC | PRN

## 2016-04-28 MED ORDER — CLOPIDOGREL BISULFATE 75 MG PO TABS: 75.0000 mg | ORAL_TABLET | Freq: Every day | ORAL | 3 refills | Status: DC

## 2016-04-28 MED ORDER — FLUOXETINE HCL 20 MG PO TABS: 20.0000 mg | ORAL_TABLET | Freq: Every day | ORAL | 3 refills | Status: DC

## 2016-04-28 MED ORDER — ATORVASTATIN CALCIUM 40 MG PO TABS: 40.0000 mg | ORAL_TABLET | Freq: Every day | ORAL | 5 refills | Status: DC

## 2016-04-28 MED ORDER — FUROSEMIDE 40 MG PO TABS: 60.0000 mg | ORAL_TABLET | Freq: Two times a day (BID) | ORAL | 3 refills | Status: DC

## 2016-04-28 MED ORDER — ACETAMINOPHEN-CODEINE #3 300-30 MG PO TABS: 1.0000 | ORAL_TABLET | Freq: Three times a day (TID) | ORAL | 1 refills | Status: DC | PRN

## 2016-04-28 MED ORDER — LACTULOSE 10 GM/15ML PO SOLN: 10.0000 g | Freq: Two times a day (BID) | ORAL | 2 refills | Status: DC | PRN

## 2016-04-28 MED ORDER — GABAPENTIN 300 MG PO CAPS: 600.0000 mg | ORAL_CAPSULE | Freq: Three times a day (TID) | ORAL | 3 refills | Status: DC

## 2016-04-28 MED ORDER — ALBUTEROL SULFATE HFA 108 (90 BASE) MCG/ACT IN AERS: INHALATION_SPRAY | RESPIRATORY_TRACT | 3 refills | Status: DC

## 2016-04-28 MED ORDER — PANTOPRAZOLE SODIUM 40 MG PO TBEC: 40.0000 mg | DELAYED_RELEASE_TABLET | ORAL | 3 refills | Status: DC

## 2016-04-28 MED ORDER — MEMANTINE HCL 10 MG PO TABS: 10.0000 mg | ORAL_TABLET | Freq: Two times a day (BID) | ORAL | 3 refills | Status: DC

## 2016-04-28 NOTE — Progress Notes (Signed)
Subjective:  Patient ID: [name], male    DOB: 06-04-1950  Age: 66 y.o. MRN: [id number]  CC: Hypertension and Diabetes   HPI [name] is a 66 year old male with a history of Diabetes mellitus (diet-controlled A1c of 5.7) HTN, CHF (EF 50-55% from 2-D echo of 10/2015), COPD, CVA with mild residual left hemiparesis, hyperlipidemia, gout, dementia,PVD here for a follow up visit.  He endorses compliance with his antihypertensives however his blood pressure is still elevated. His diabetes is diet controlled and he denies hypoglycemic episodes he does have diabetic neuropathy and remains on gabapentin which controls his symptoms. Up-to-date on foot exams and is currently wearing diabetic shoes.  He complains of pinpoint rash in bilateral lower extremities which are pruritic; he does have chronic bilateral lower extremity edema from his peripheral vascular disease and has not seen his vascular surgeon in a while.  Denies recent COPD flares; insomnia and depression are controlled on medications. He has no additional concerns today.  He continues to smoke and is not ready to quit at this time.  Past Medical History:  Diagnosis Date  . Alcohol abuse    H/O  . Anxiety   . Arthritis   . Asthma   . CAD (coronary artery disease) 05/27/10   Cath: severe single vessell CAD left cx midportion obtuse marginal 2 to 3.  [name] CHF (congestive heart failure) ([place])   . COPD (chronic obstructive pulmonary disease) ([place])   . COPD (chronic obstructive pulmonary disease) ([place])   . Depression   . Diabetes mellitus, type 2 ([place]) 03/13/2015  . GERD (gastroesophageal reflux disease)   . HCAP (healthcare-associated pneumonia) 10/15/2015  . History of DVT of lower extremity   . Hyperlipemia   . Hyperlipidemia   . Hypertension   . Myocardial infarction 2000  . Orbital fracture ([place]) 12/2012  . OSA on CPAP   . Peripheral vascular disease, unspecified    08/20/10 doppler: increase in right ABI  post-op. Left ABI stable. S/P bi-fem bypass surgery  . Pneumonia 04/04/2012  . Shortness of breath   . Stroke ([place])   . Tobacco abuse     Past Surgical History:  Procedure Laterality Date  . abdoninal ao angio & bifem angio  05/27/10   Patent graft, occluded bil stents with no retrograde flow into the hypogastric arteries. 100% occl left ant. tibial artery, 70% to 80% to 100% stenosis right superficial fem artery above adductor canal. 100 % occl right ant. tibial vessell  . Aortogram w/ PTCA  09/[id number]  . BACK SURGERY    . CARDIAC CATHETERIZATION  05/27/10   severe CAD left cx  . CHOLECYSTECTOMY    . ESOPHAGOGASTRIC FUNDOPLICATION    . EYE SURGERY    . FEMORAL BYPASS  08/19/10   Right Fem-Pop  . JOINT REPLACEMENT    . PERIPHERAL VASCULAR CATHETERIZATION N/A 12/10/2014   Procedure: Abdominal Aortogram;  Surgeon: Angelia Mould, MD;  Location: [facility];  Service: Cardiovascular;  Laterality: N/A;  . RADIOLOGY WITH ANESTHESIA N/A 02/08/2015   Procedure: RADIOLOGY WITH ANESTHESIA;  Surgeon: Luanne Bras, MD;  Location: [facility];  Service: Radiology;  Laterality: N/A;  . TEE WITHOUT CARDIOVERSION N/A 08/29/2014   Procedure: TRANSESOPHAGEAL ECHOCARDIOGRAM (TEE);  Surgeon: Josue Hector, MD;  Location: [facility];  Service: Cardiovascular;  Laterality: N/A;  . TOTAL KNEE ARTHROPLASTY       Allergies  Allergen Reactions  . Darvocet [Propoxyphene N-Acetaminophen] Hives  . Haldol [Haloperidol  Decanoate] Hives  . Acetaminophen Nausea Only    Upset stomach, tolerates Hydrocodone/APAP if taken with food  . Metformin And Related Nausea And Vomiting  . Norco [Hydrocodone-Acetaminophen] Nausea And Vomiting    Tolerates if taken with food     Outpatient Medications Prior to Visit  Medication Sig Dispense Refill  . ACCU-CHEK SOFTCLIX LANCETS lancets Use 3 times daily before meals 100 each 5  . aspirin 325 MG EC tablet Take 1 tablet (325 mg total) by mouth daily. 30  tablet 0  . Blood Glucose Monitoring Suppl (ACCU-CHEK AVIVA) device Use 3 times daily before meals. 1 each 0  . glucose blood (ACCU-CHEK AVIVA) test strip Use 3 times daily before meals as directed. 100 each 12  . Lancet Devices (ACCU-CHEK SOFTCLIX) lancets Use 3 times daily before meals 1 each 5  . Misc. Devices (ROLLATOR) MISC 1 each by Does not apply route daily. 1 each 0  . allopurinol (ZYLOPRIM) 300 MG tablet Take 1 tablet (300 mg total) by mouth daily. 30 tablet 2  . atorvastatin (LIPITOR) 40 MG tablet Take 1 tablet (40 mg total) by mouth daily at 6 PM. 30 tablet 5  . carvedilol (COREG) 25 MG tablet Take 1 tablet (25 mg total) by mouth 2 (two) times daily with a meal. 60 tablet 3  . clopidogrel (PLAVIX) 75 MG tablet Take 1 tablet (75 mg total) by mouth daily. 30 tablet 3  . FLUoxetine (PROZAC) 20 MG tablet Take 1 tablet (20 mg total) by mouth daily. 30 tablet 3  . furosemide (LASIX) 40 MG tablet Take 1.5 tablets (60 mg total) by mouth 2 (two) times daily. 90 tablet 3  . gabapentin (NEURONTIN) 300 MG capsule Take 2 capsules (600 mg total) by mouth 3 (three) times daily. 180 capsule 3  . lactulose (CHRONULAC) 10 GM/15ML solution Take 15 mLs (10 g total) by mouth 2 (two) times daily as needed for mild constipation. 946 mL 2  . memantine (NAMENDA) 10 MG tablet Take 1 tablet (10 mg total) by mouth 2 (two) times daily. 60 tablet 3  . pantoprazole (PROTONIX) 40 MG tablet Take 1 tablet (40 mg total) by mouth every morning. 30 tablet 2  . PROAIR HFA 108 (90 Base) MCG/ACT inhaler Inhale 2 puffs into the lungs every 6 (six) hours as needed for wheezing or shortness of breath. (200/8=25) 8.5 g 0  . tiZANidine (ZANAFLEX) 4 MG tablet Take 1 tablet (4 mg total) by mouth every 8 (eight) hours as needed for muscle spasms. 90 tablet 3  . traZODone (DESYREL) 50 MG tablet Take 0.5-1 tablets (25-50 mg total) by mouth at bedtime as needed for sleep. 30 tablet 3  . triamcinolone cream (KENALOG) 0.1 % Apply 1  application topically daily as needed (for dermatitis).     . colchicine 0.6 MG tablet Take 2 tabs (1.2 mg) at the onset of a gout attack, may repeat 1 tablet (0.6 mg) in one hour if symptoms persist (Patient not taking: Reported on 04/28/2016) 30 tablet 2  . acetaminophen-codeine (TYLENOL #3) 300-30 MG tablet Take 1 tablet by mouth every 8 (eight) hours as needed for moderate pain. (Patient not taking: Reported on 04/28/2016) 60 tablet 1  . albuterol (PROVENTIL) (2.5 MG/3ML) 0.083% nebulizer solution Take 3 mLs (2.5 mg total) by nebulization every 6 (six) hours as needed for wheezing or shortness of breath. (Patient not taking: Reported on 04/28/2016) 75 mL 3   No facility-administered medications prior to visit.     ROS  Review of Systems Constitutional: Negative for activity change and appetite change.  HENT: Negative for sinus pressure and sore throat.   Eyes: Negative for visual disturbance.  Respiratory: Negative for cough, chest tightness and shortness of breath.   Cardiovascular: Positive for leg swelling and pain . Negative for chest pain.  Gastrointestinal: Negative for abdominal distention, abdominal pain, constipation and diarrhea.  Endocrine: Negative.   Genitourinary: Negative for dysuria.  Musculoskeletal: positive for  for joint swelling and myalgias.  Skin: Negative for rash.  Allergic/Immunologic: Negative.   Neurological: Negative for weakness, light-headedness and numbness.  Psychiatric/Behavioral: positive for dysphoric mood and negative for suicidal ideas.   Objective:  BP (!) 168/81 (BP Location: Right Arm, Patient Position: Sitting, Cuff Size: Large)   Pulse 67   Temp 97.4 F (36.3 C) (Oral)   Ht 6' (1.829 m)   Wt 234 lb 9.6 oz (106.4 kg)   SpO2 97%   BMI 31.82 kg/m   BP/Weight 04/28/2016 41/32/4401 0/03/7251  Systolic BP 664 403 474  Diastolic BP 81 62 73  Wt. (Lbs) 234.6 249.2 -  BMI 31.82 31.15 -      Physical Exam Constitutional: He is oriented to  person, place, and time. He appears well-developed and well-nourished.  Neck: No JVD present. No tracheal deviation present.  Cardiovascular: Normal rate, regular rhythm and normal heart sounds.   No murmur heard. Unable to palpate dorsalis pedis pulses due to edema  Pulmonary/Chest: Effort normal and breath sounds normal. No respiratory distress. He has no wheezes. He exhibits no tenderness.  Abdominal: Soft. Bowel sounds are normal. He exhibits no mass. There is no tenderness.  Musculoskeletal: Normal range of motion. He exhibits edema (bilateral nonpitting dorsal edema)  Mild tenderness on palpation of left side of back.  Neurological: He is alert and oriented to person, place, and time.  Skin: pin point rash on bilateral lower extremities  Psychiatric: He has a normal mood and affect.   CMP Latest Ref Rng & Units 10/16/2015 10/16/2015 10/15/2015  Glucose 65 - 99 mg/dL 111(H) 129(H) 222(H)  BUN 6 - 20 mg/dL '9 10 8  '$ Creatinine 0.61 - 1.24 mg/dL 0.98 0.94 1.50(H)  Sodium 135 - 145 mmol/L 146(H) 144 141  Potassium 3.5 - 5.1 mmol/L 5.0 3.1(L) 3.6  Chloride 101 - 111 mmol/L 112(H) 106 102  CO2 22 - 32 mmol/L 31 26 -  Calcium 8.9 - 10.3 mg/dL 9.3 8.8(L) -  Total Protein 6.5 - 8.1 g/dL - - -  Total Bilirubin 0.3 - 1.2 mg/dL - - -  Alkaline Phos 38 - 126 U/L - - -  AST 15 - 41 U/L - - -  ALT 17 - 63 U/L - - -    Lipid Panel     Component Value Date/Time   CHOL 233 (H) 09/27/2015 1208   TRIG 146 09/27/2015 1208   HDL 48 09/27/2015 1208   CHOLHDL 4.9 09/27/2015 1208   VLDL 29 09/27/2015 1208   LDLCALC 156 (H) 09/27/2015 1208   Lab Results  Component Value Date   HGBA1C 5.7 04/28/2016    Assessment & Plan:   1. Other constipation Increase fiber Continue lactulose as needed  lactulose (CHRONULAC) 10 GM/15ML solution; Take 15 mLs (10 g total) by mouth 2 (two) times daily as needed for mild constipation.  Dispense: 946 mL; Refill: 2  2. Chronic obstructive pulmonary disease,  unspecified COPD type (HCC) Stable - albuterol (PROAIR HFA) 108 (90 Base) MCG/ACT inhaler; Inhale 2 puffs into the  lungs every 6 (six) hours as needed for wheezing or shortness of breath.  Dispense: 8.5 g; Refill: 3 - albuterol (PROVENTIL) (2.5 MG/3ML) 0.083% nebulizer solution; Take 3 mLs (2.5 mg total) by nebulization every 6 (six) hours as needed for wheezing or shortness of breath.  Dispense: 75 mL; Refill: 3  3. Idiopathic gout, unspecified chronicity, unspecified site No acute flares - allopurinol (ZYLOPRIM) 300 MG tablet; Take 1 tablet (300 mg total) by mouth daily.  Dispense: 30 tablet; Refill: 3  4. Pure hypercholesterolemia Uncontrolled Low-cholesterol diet - atorvastatin (LIPITOR) 40 MG tablet; Take 1 tablet (40 mg total) by mouth daily at 6 PM.  Dispense: 30 tablet; Refill: 5 - furosemide (LASIX) 40 MG tablet; Take 1.5 tablets (60 mg total) by mouth 2 (two) times daily.  Dispense: 90 tablet; Refill: 3  5. Chronic diastolic congestive heart failure (HCC) EF 55-60% from 2-D echo 10/2015 Euvolemic Continue beta blocker ACE inhibitor added to her regimen  6. Type 2 diabetes mellitus with other neurologic complication, without long-term current use of insulin (HCC) Controlled with A1c of 5.7 - gabapentin (NEURONTIN) 300 MG capsule; Take 2 capsules (600 mg total) by mouth 3 (three) times daily.  Dispense: 180 capsule; Refill: 3  7. Dementia due to another medical condition, without behavioral disturbance Stable - memantine (NAMENDA) 10 MG tablet; Take 1 tablet (10 mg total) by mouth 2 (two) times daily.  Dispense: 60 tablet; Refill: 3  8. Muscle spasm Controlled We'll send off urine drug screen - tiZANidine (ZANAFLEX) 4 MG tablet; Take 1 tablet (4 mg total) by mouth every 8 (eight) hours as needed for muscle spasms.  Dispense: 90 tablet; Refill: 3 - acetaminophen-codeine (TYLENOL #3) 300-30 MG tablet; Take 1 tablet by mouth every 8 (eight) hours as needed for moderate pain.   Dispense: 60 tablet; Refill: 1  9. Type 2 diabetes mellitus with diabetic peripheral angiopathy without gangrene, without long-term current use of insulin (HCC) History of peripheral vascular disease Patient encouraged to schedule appointment with vascular surgeon - Glucose (CBG) - HgB A1c - COMPLETE METABOLIC PANEL WITH GFR - Microalbumin / creatinine urine ratio  10. Essential hypertension Uncontrolled Low-sodium diet Lisinopril added regimen - carvedilol (COREG) 25 MG tablet; Take 1 tablet (25 mg total) by mouth 2 (two) times daily with a meal.  Dispense: 60 tablet; Refill: 3  11. History of stroke Risk factor modification - clopidogrel (PLAVIX) 75 MG tablet; Take 1 tablet (75 mg total) by mouth daily.  Dispense: 30 tablet; Refill: 3  12. Moderate single current episode of major depressive disorder (HCC) Stable - FLUoxetine (PROZAC) 20 MG tablet; Take 1 tablet (20 mg total) by mouth daily.  Dispense: 30 tablet; Refill: 3  13. Other insomnia Controlled - traZODone (DESYREL) 50 MG tablet; Take 1 tablet (50 mg total) by mouth at bedtime as needed for sleep.  Dispense: 30 tablet; Refill: 3  Drug screen sent off and we will call in his Tylenol 3 prescription if test reflects presence of meds  Meds ordered this encounter  Medications  . lactulose (CHRONULAC) 10 GM/15ML solution    Sig: Take 15 mLs (10 g total) by mouth 2 (two) times daily as needed for mild constipation.    Dispense:  946 mL    Refill:  2  . albuterol (PROAIR HFA) 108 (90 Base) MCG/ACT inhaler    Sig: Inhale 2 puffs into the lungs every 6 (six) hours as needed for wheezing or shortness of breath.    Dispense:  8.5  g    Refill:  3  . albuterol (PROVENTIL) (2.5 MG/3ML) 0.083% nebulizer solution    Sig: Take 3 mLs (2.5 mg total) by nebulization every 6 (six) hours as needed for wheezing or shortness of breath.    Dispense:  75 mL    Refill:  3  . memantine (NAMENDA) 10 MG tablet    Sig: Take 1 tablet (10 mg  total) by mouth 2 (two) times daily.    Dispense:  60 tablet    Refill:  3  . carvedilol (COREG) 25 MG tablet    Sig: Take 1 tablet (25 mg total) by mouth 2 (two) times daily with a meal.    Dispense:  60 tablet    Refill:  3  . clopidogrel (PLAVIX) 75 MG tablet    Sig: Take 1 tablet (75 mg total) by mouth daily.    Dispense:  30 tablet    Refill:  3  . allopurinol (ZYLOPRIM) 300 MG tablet    Sig: Take 1 tablet (300 mg total) by mouth daily.    Dispense:  30 tablet    Refill:  3  . FLUoxetine (PROZAC) 20 MG tablet    Sig: Take 1 tablet (20 mg total) by mouth daily.    Dispense:  30 tablet    Refill:  3  . tiZANidine (ZANAFLEX) 4 MG tablet    Sig: Take 1 tablet (4 mg total) by mouth every 8 (eight) hours as needed for muscle spasms.    Dispense:  90 tablet    Refill:  3  . traZODone (DESYREL) 50 MG tablet    Sig: Take 1 tablet (50 mg total) by mouth at bedtime as needed for sleep.    Dispense:  30 tablet    Refill:  3  . pantoprazole (PROTONIX) 40 MG tablet    Sig: Take 1 tablet (40 mg total) by mouth every morning.    Dispense:  30 tablet    Refill:  3  . gabapentin (NEURONTIN) 300 MG capsule    Sig: Take 2 capsules (600 mg total) by mouth 3 (three) times daily.    Dispense:  180 capsule    Refill:  3  . atorvastatin (LIPITOR) 40 MG tablet    Sig: Take 1 tablet (40 mg total) by mouth daily at 6 PM.    Dispense:  30 tablet    Refill:  5  . furosemide (LASIX) 40 MG tablet    Sig: Take 1.5 tablets (60 mg total) by mouth 2 (two) times daily.    Dispense:  90 tablet    Refill:  3  . lisinopril (PRINIVIL,ZESTRIL) 10 MG tablet    Sig: Take 1 tablet (10 mg total) by mouth daily.    Dispense:  90 tablet    Refill:  3  . DISCONTD: acetaminophen-codeine (TYLENOL #3) 300-30 MG tablet    Sig: Take 1 tablet by mouth every 8 (eight) hours as needed for moderate pain.    Dispense:  60 tablet    Refill:  1  . triamcinolone cream (KENALOG) 0.1 %    Sig: Apply 1 application topically  daily as needed (for dermatitis).    Dispense:  30 g    Refill:  1  . acetaminophen-codeine (TYLENOL #3) 300-30 MG tablet    Sig: Take 1 tablet by mouth every 8 (eight) hours as needed for moderate pain.    Dispense:  60 tablet    Refill:  1    Follow-up: Return  in about 3 months (around 07/29/2016) for follow up on chronic medical conditions.   Arnoldo Morale MD

## 2016-04-28 NOTE — Progress Notes (Signed)
Unable to obtain lab work. Spoke with patient to schedule a lab visit appointment by the end of the week.

## 2016-04-28 NOTE — Progress Notes (Signed)
Medication refills on everything

## 2016-04-29 LAB — COMPLETE METABOLIC PANEL WITH GFR

## 2016-04-30 LAB — MICROALBUMIN / CREATININE URINE RATIO
CREATININE, URINE: 195 mg/dL (ref 20–370)
MICROALB UR: 0.8 mg/dL
Microalb Creat Ratio: 4 mcg/mg creat (ref <30)

## 2016-05-04 ENCOUNTER — Other Ambulatory Visit: Payer: Self-pay | Admitting: Family Medicine

## 2016-05-04 ENCOUNTER — Ambulatory Visit: Payer: Medicare Other | Attending: Family Medicine

## 2016-05-04 DIAGNOSIS — M1000 Idiopathic gout, unspecified site: Secondary | ICD-10-CM | POA: Insufficient documentation

## 2016-05-04 DIAGNOSIS — F1910 Other psychoactive substance abuse, uncomplicated: Secondary | ICD-10-CM

## 2016-05-04 IMAGING — CR DG CHEST 2V
2 series · 2 of 2 positions shown · non-contrast
Comparison: 06/23/2014 and 06/18/2014

CLINICAL DATA: Left arm weakness and numbness. Productive cough for
3 weeks.

EXAM:
CHEST  2 VIEW

[chest lat]
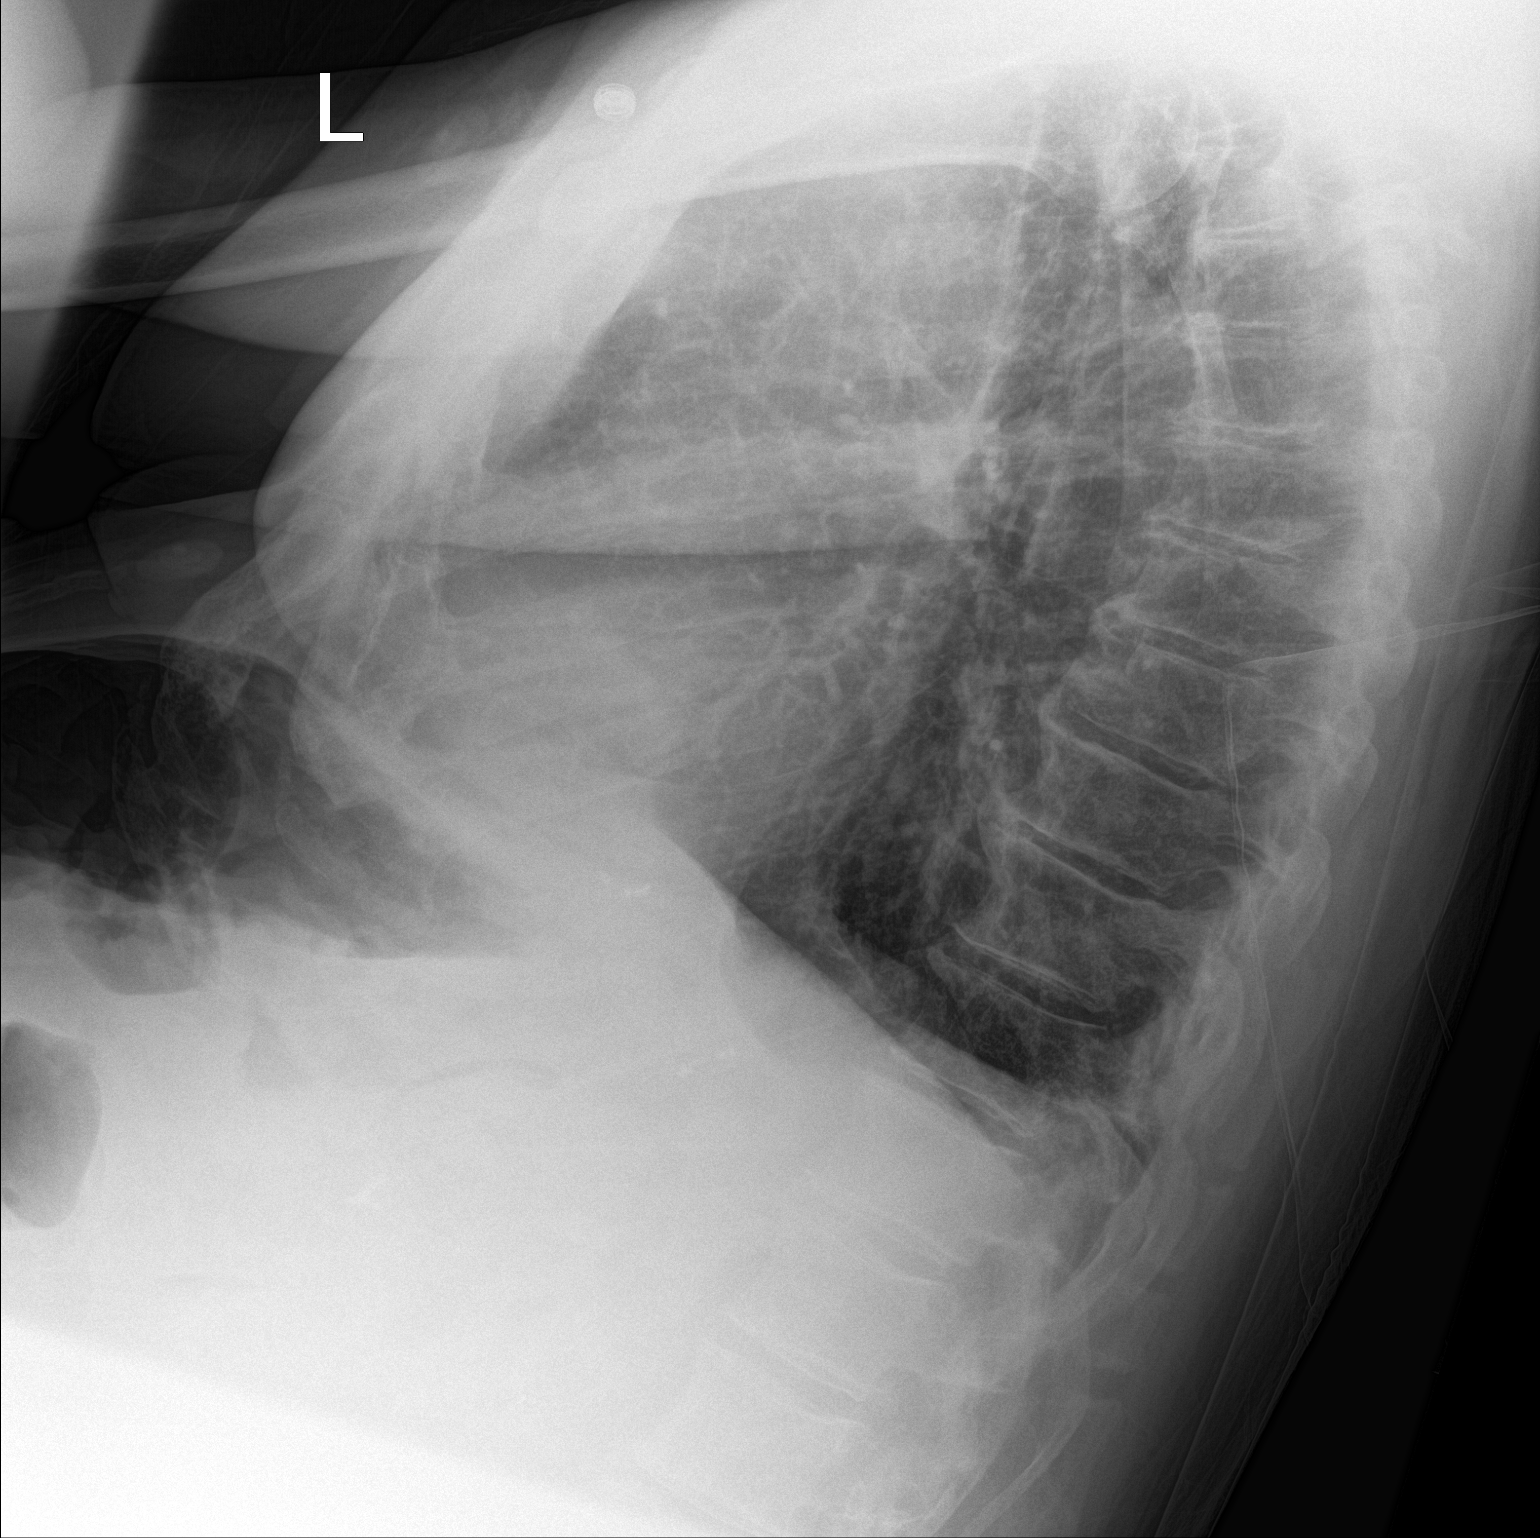

[chest ap]
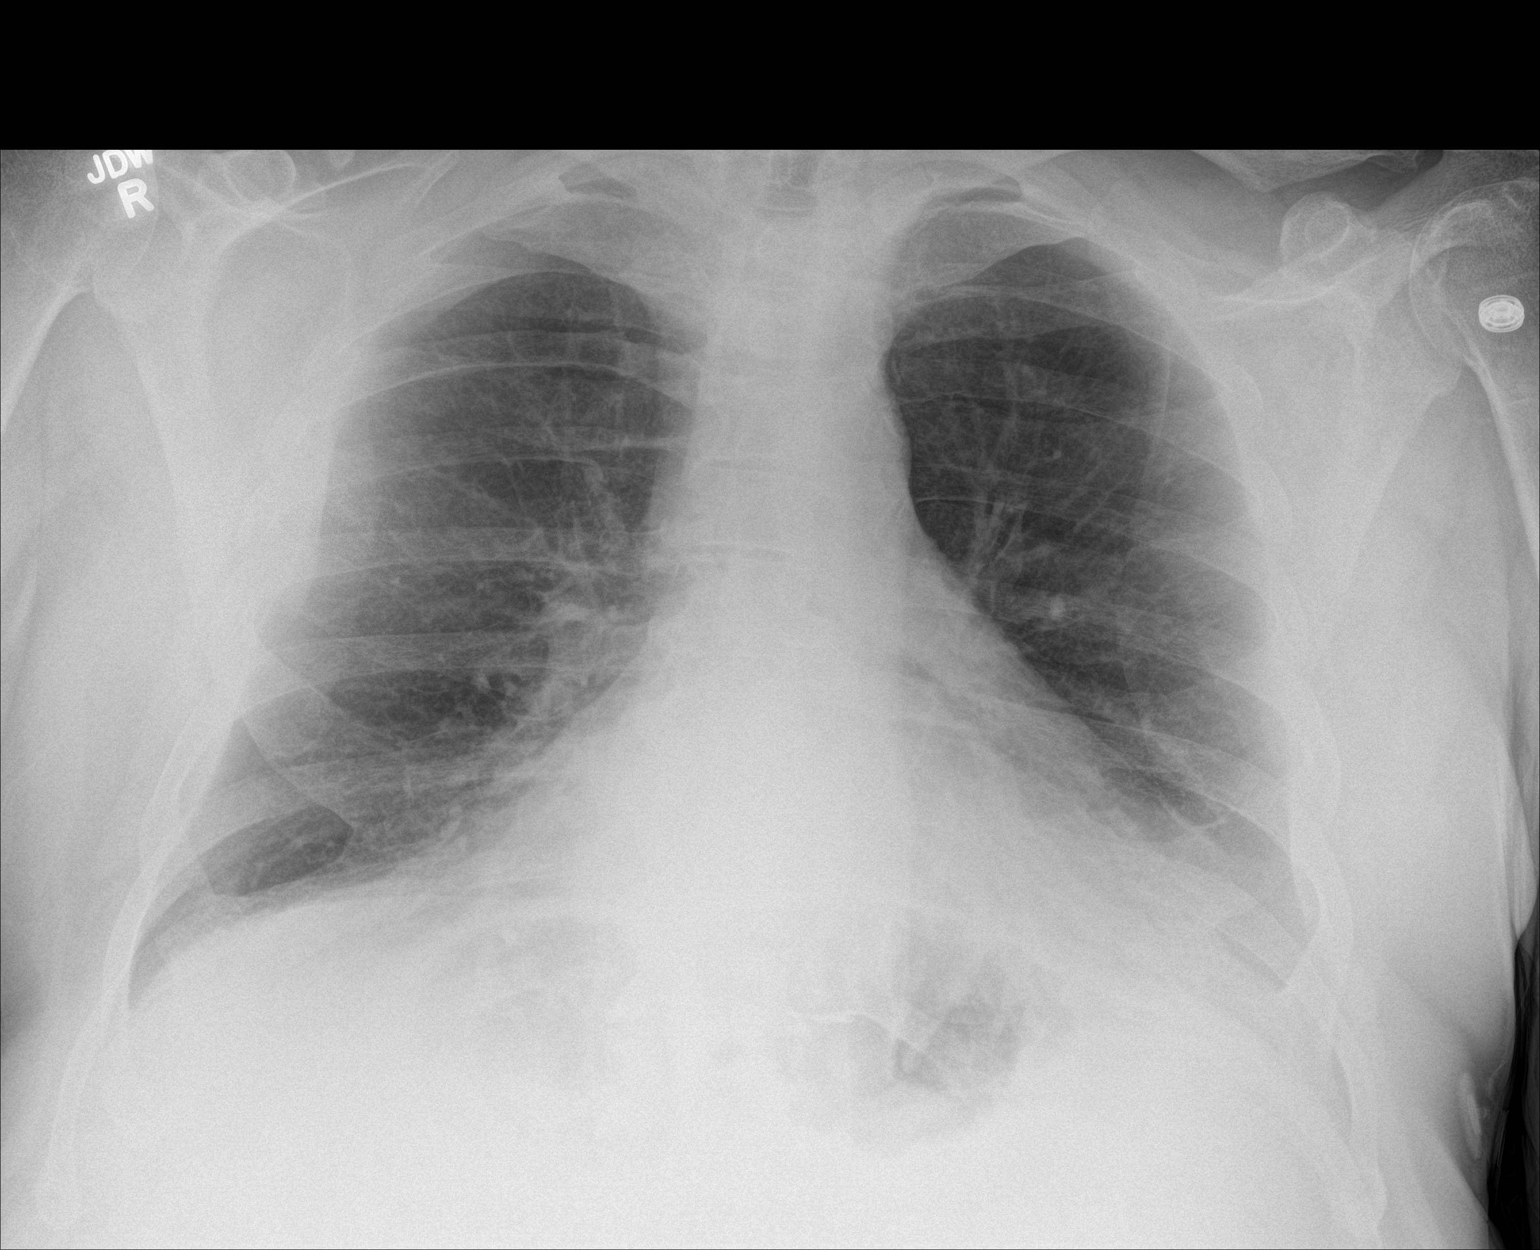

[2 of 2 positions shown; findings below may reference images not displayed]

FINDINGS: Heart size and pulmonary vascularity are normal. The lungs are
clear. There is a hiatal hernia. The CT scan of 12/12/2012
demonstrated that the Nissen fundoplication is herniated above the
diaphragm. The wrap was intact at that time.

No acute osseous abnormality.
IMPRESSION: No acute abnormality.

## 2016-05-04 IMAGING — MR MR HEAD W/O CM
8 of 10 series · 35 of 48 positions shown · non-contrast
Comparison: CT head without contrast from the same day. MRI brain
04/11/2013.

CLINICAL DATA: Acute onset of left arm weakness and numbness
beginning at [DATE] last evening. The patient was diaphoretic and
hypotensive at the time.

EXAM:
MRI HEAD WITHOUT CONTRAST
TECHNIQUE: Multiplanar, multiecho pulse sequences of the brain and surrounding
structures were obtained without intravenous contrast.

[Series 3: T1 · sagittal · 5.0mm · 0.47mm/px · 2 of 23 slices shown]
[im 1/23]
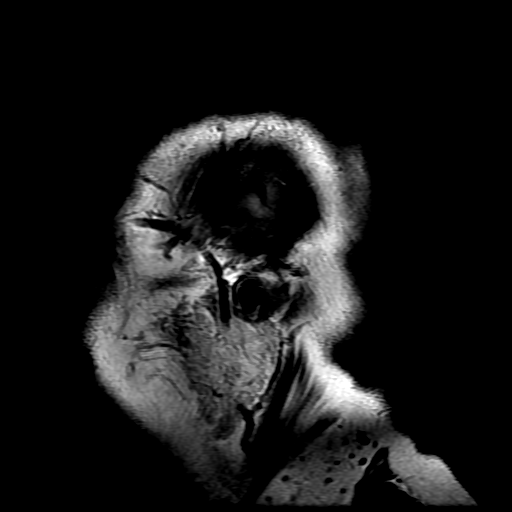
[im 23/23]
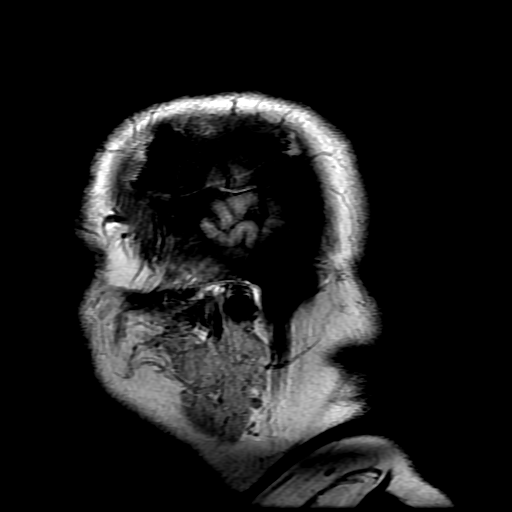

[Series 4: DWI · axial · 3.0mm · 1.09mm/px · z∈[-64,+67]mm · 9 of 90 slices shown (1 of 4)]
[im 1/90]
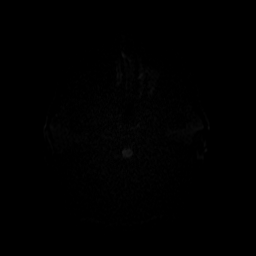
[im 12/90]
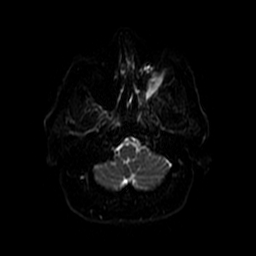
[im 23/90]
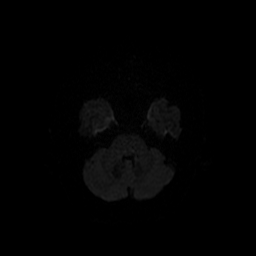
[im 34/90]
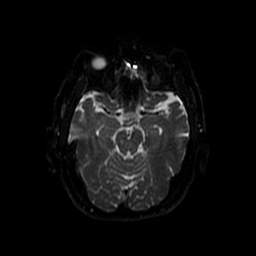
[im 45/90]
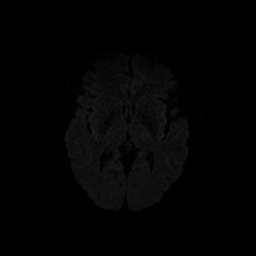
[im 56/90]
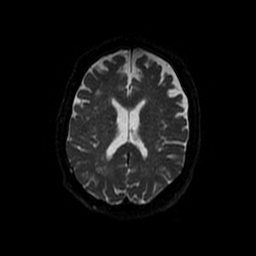
[im 67/90]
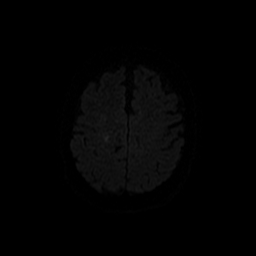
[im 78/90]
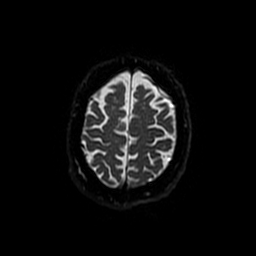
[im 90/90]
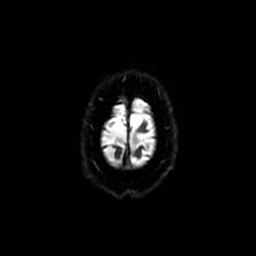

[Series 5: DWI · coronal · 5.0mm · 1.09mm/px · 7 of 66 slices shown (2 of 4)]
[im 1/66]
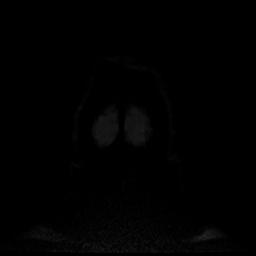
[im 11/66]
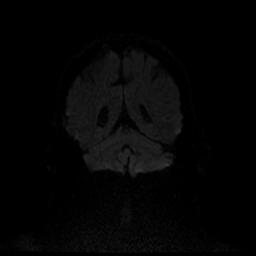
[im 22/66]
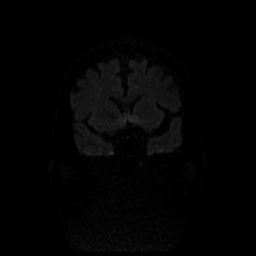
[im 33/66]
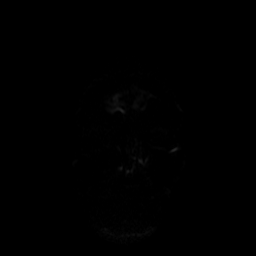
[im 44/66]
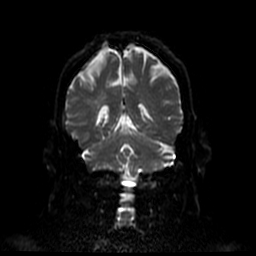
[im 55/66]
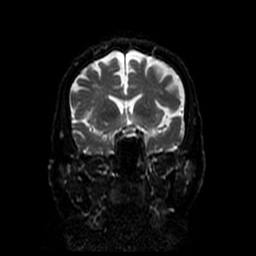
[im 66/66]
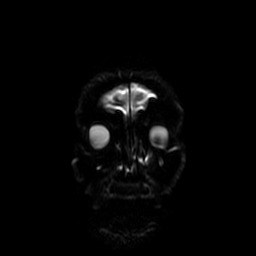

[Series 6: T2 · axial · 5.0mm · 0.43mm/px · z∈[-77,+66]mm · 3 of 25 slices shown (1 of 2)]
[im 1/25]
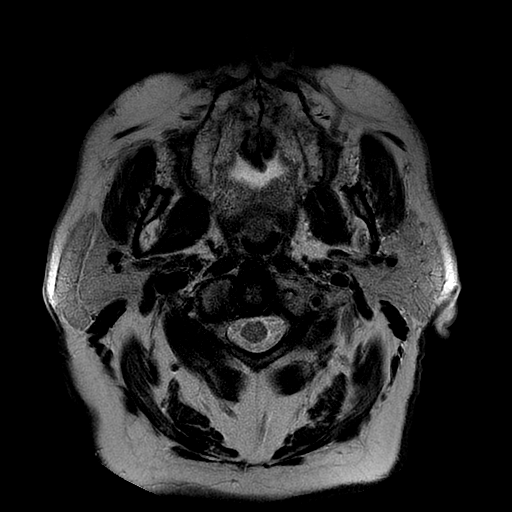
[im 13/25]
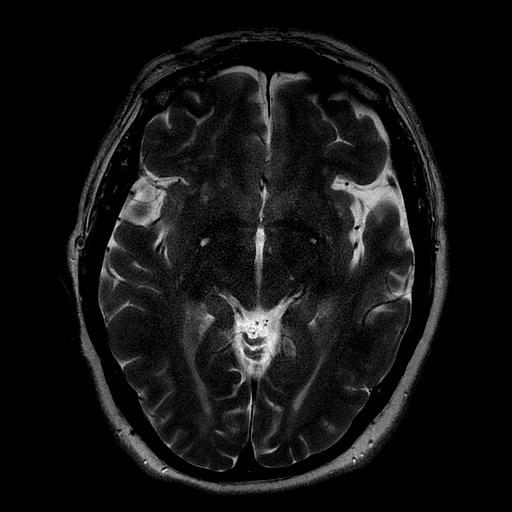
[im 25/25]
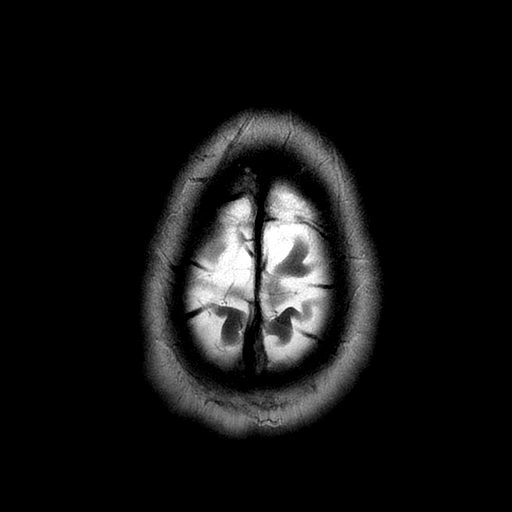

[Series 7: FLAIR · axial · 5.0mm · 0.43mm/px · z∈[-77,+66]mm · 3 of 25 slices shown]
[im 1/25]
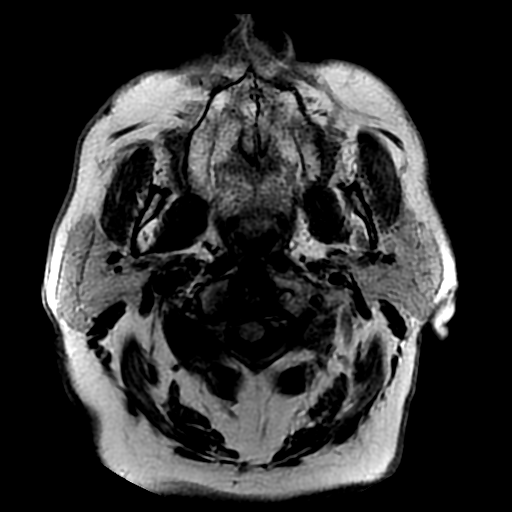
[im 13/25]
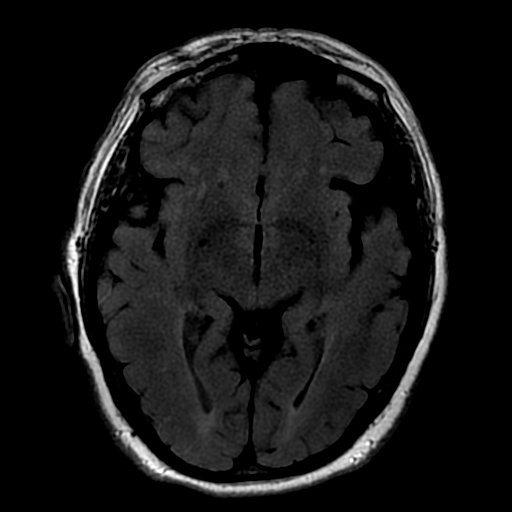
[im 25/25]
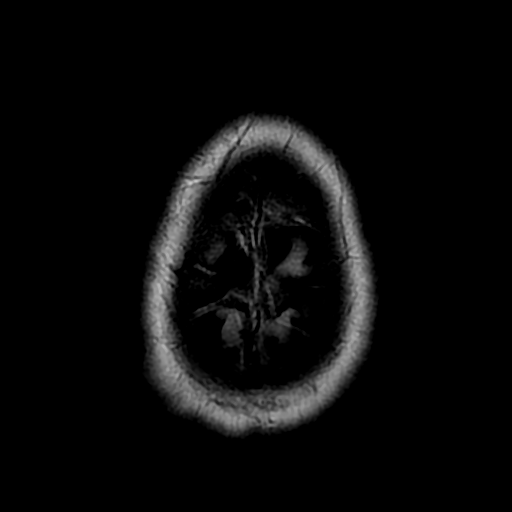

[Series 10: T2 · coronal · 5.0mm · 0.43mm/px · 3 of 28 slices shown (2 of 2)]
[im 1/28]
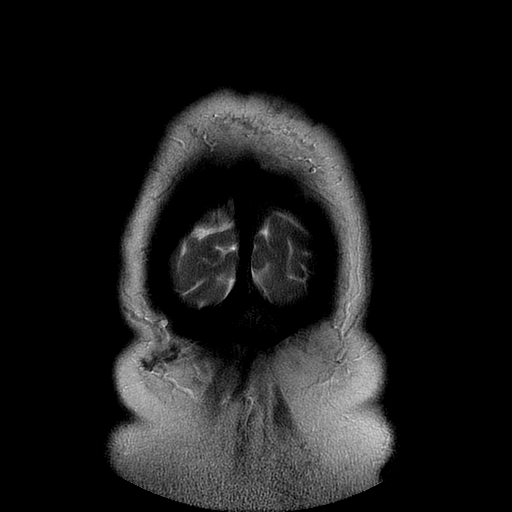
[im 14/28]
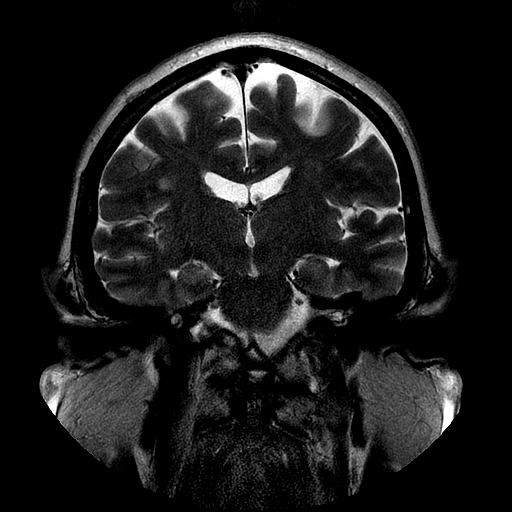
[im 28/28]
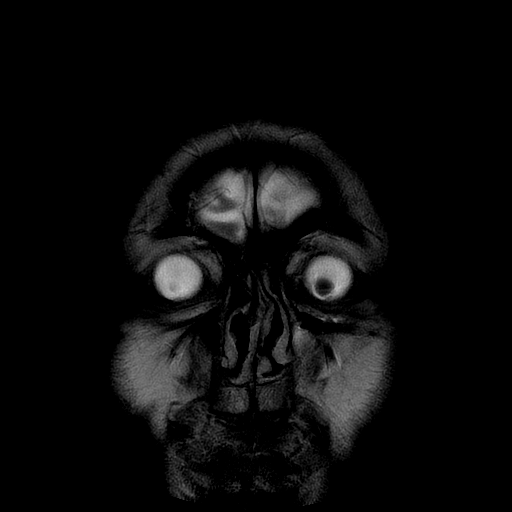

[Series 400: DWI · axial · 3.0mm · 1.09mm/px · z∈[-64,+67]mm · 5 of 45 slices shown (3 of 4)]
[im 1/45]
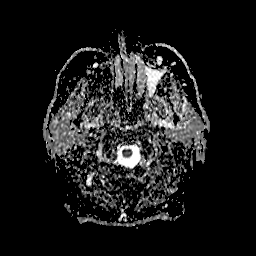
[im 12/45]
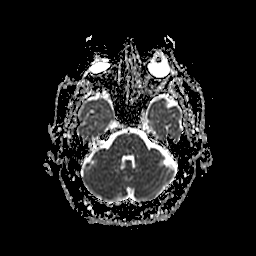
[im 23/45]
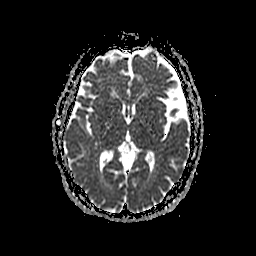
[im 34/45]
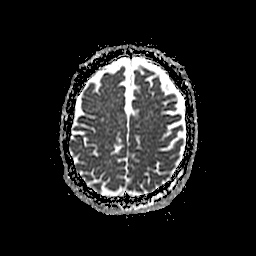
[im 45/45]
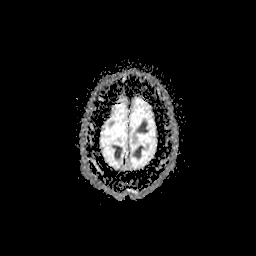

[Series 500: DWI · coronal · 5.0mm · 1.09mm/px · 3 of 33 slices shown (4 of 4)]
[im 1/33]
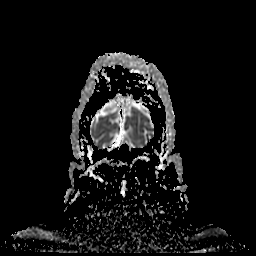
[im 17/33]
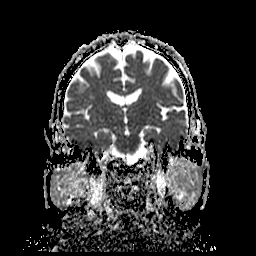
[im 33/33]
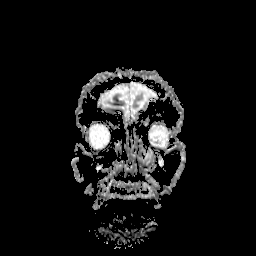

[35 of 48 positions shown; findings below may reference images not displayed]

FINDINGS: At least 5 different punctate foci of restricted diffusion are
present within the right frontal lobe along a watershed distribution
between the ACA and MCA territories.

Moderate generalized atrophy is present. Moderate periventricular
and scattered subcortical T2 hyperintensities are present
bilaterally. No acute hemorrhage is present. A 7 mm dural-based
lesion over the left frontal convexity likely represents a small
meningioma.

Flow is present in the major intracranial arteries. A right lens
replacement is noted. The globes and orbits are otherwise intact.
Chronic left ethmoid and maxillary sinus opacification is noted. The
paranasal sinuses are otherwise clear. The mastoid air cells are
clear.

Skullbase is unremarkable. Midline structures demonstrate fusion
across the C2-3 disc space. Intracranial structures are within
normal limits.
IMPRESSION: 1. At least 5 punctate nonhemorrhagic infarcts over the right
frontal lobe within a watershed distribution between the ACA and MCA
territories. This corresponds with the hypotensive episode and left
arm symptoms.
2. Moderate atrophy and diffuse white matter disease is advanced for
age. This likely reflects the sequela of chronic microvascular
ischemia.
3. 7 mm dural-based lesion over the left frontal convexity likely
represents a meningioma.
4. Chronic left ethmoid and maxillary sinus disease.
These results were called by telephone at the time of interpretation
on 08/01/2014 at [DATE] to Dr. TOLEDANO , who verbally acknowledged
these results.

## 2016-05-04 IMAGING — CT CT HEAD W/O CM
2 series · 16 of 30 positions shown, 20 images · non-contrast
Comparison: 04/10/2013

CLINICAL DATA: Left arm weakness starting last night

EXAM:
CT HEAD WITHOUT CONTRAST
TECHNIQUE: Contiguous axial images were obtained from the base of the skull
through the vertex without intravenous contrast.

[Series 2: head 5.0 h30s · axial · 0.47mm/px · z∈[-151,-126]mm · 2 of 33 slices shown]
[im 3/33  brain]
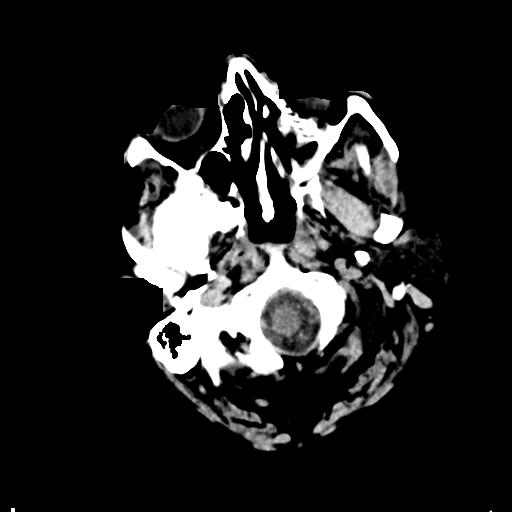
[im 8/33  brain]
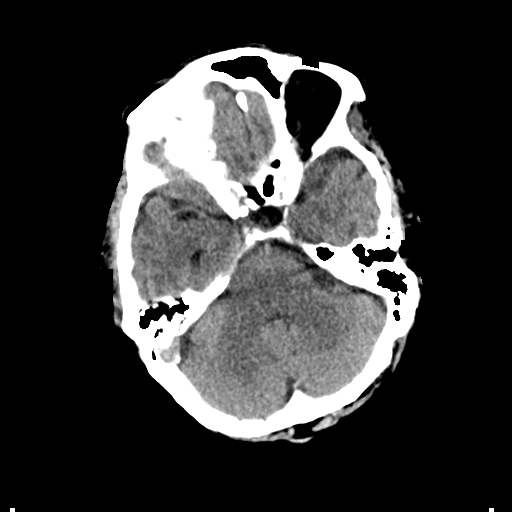

[Series 4: head 5.0 mpr · axial · 0.47mm/px · z∈[-220,-76]mm · 14 of 36 slices shown, 18 images]
[im 3/36  brain]
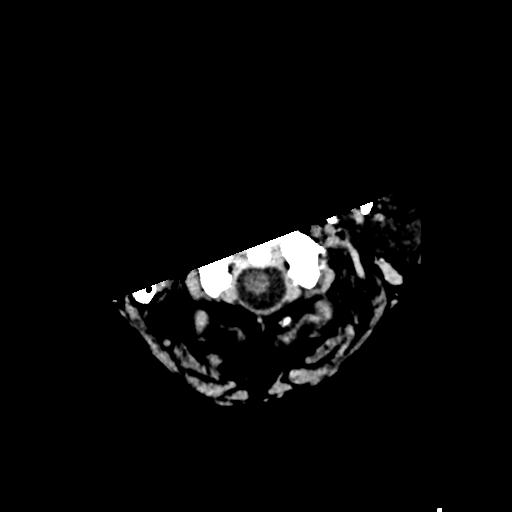
[im 3/36  bone]
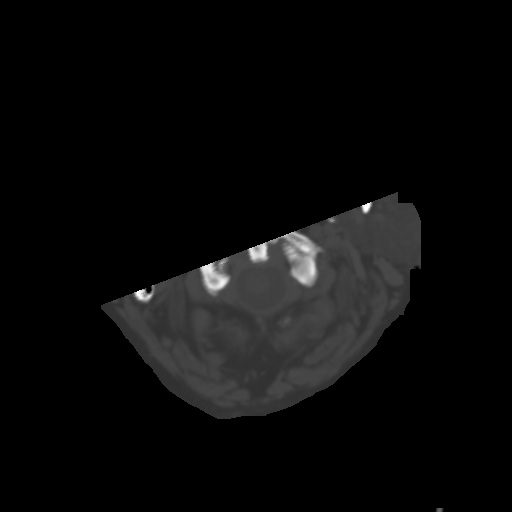
[im 5/36  brain]
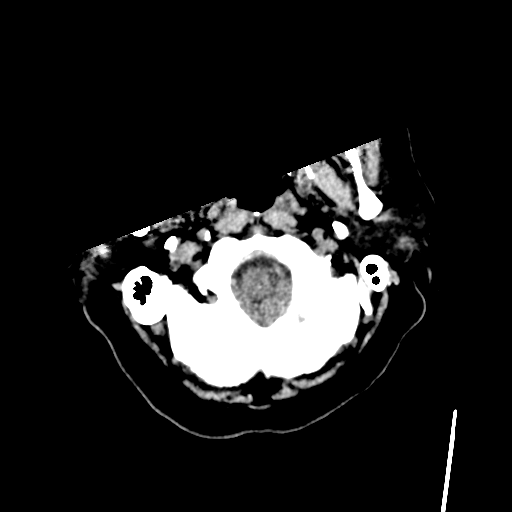
[im 8/36  brain]
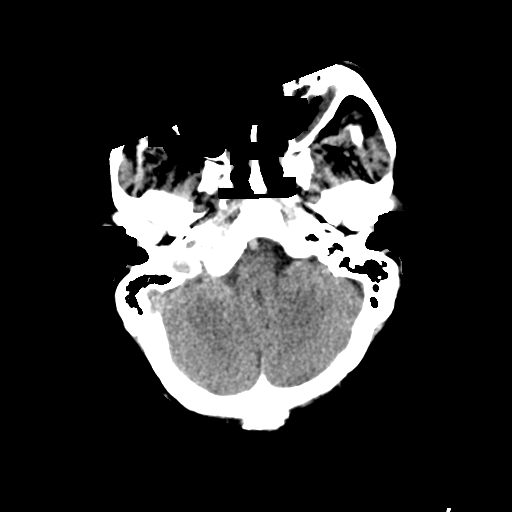
[im 10/36  brain]
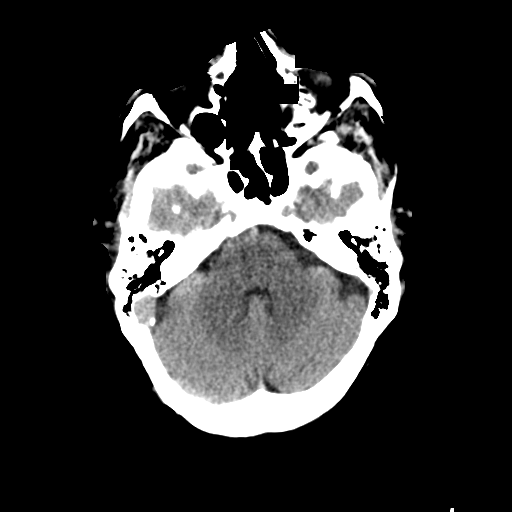
[im 12/36  brain]
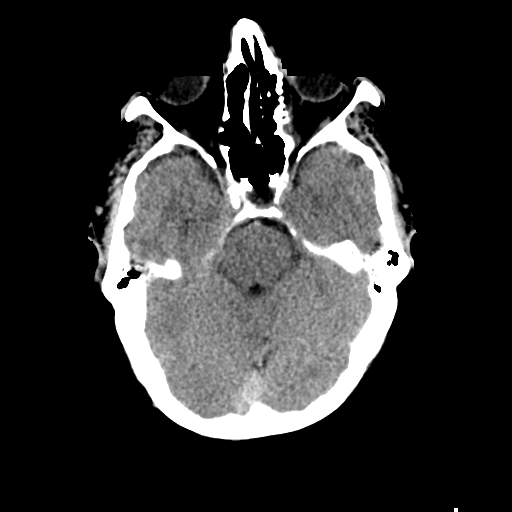
[im 12/36  bone]
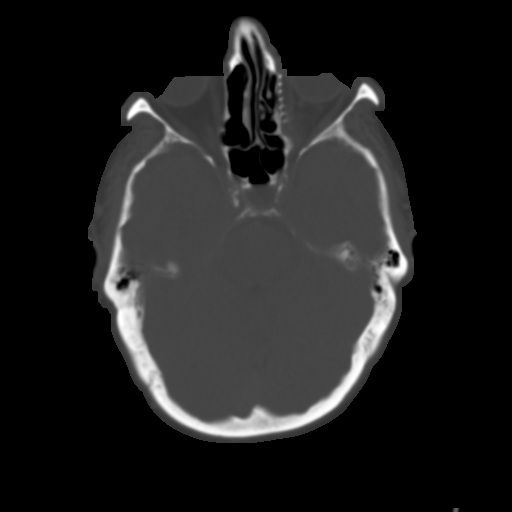
[im 15/36  brain]
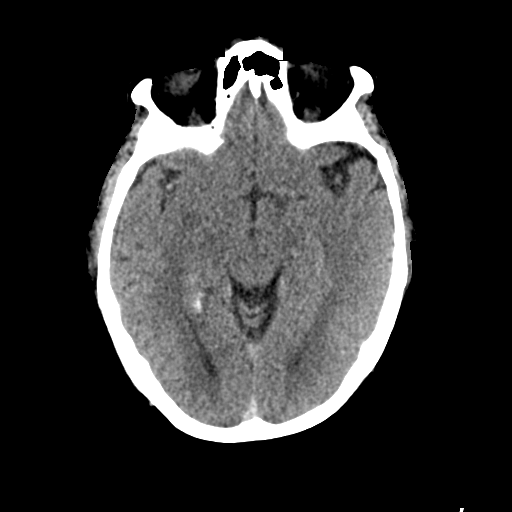
[im 17/36  brain]
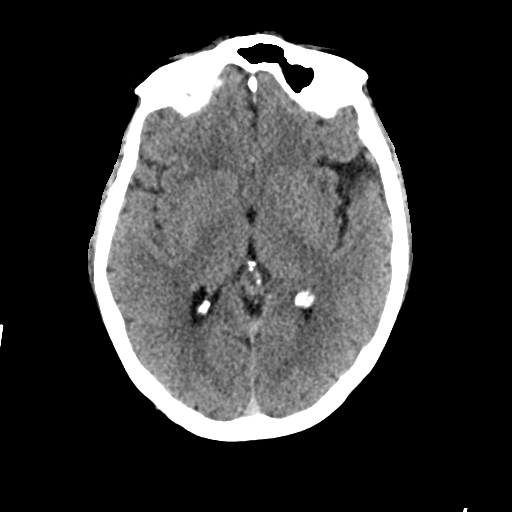
[im 19/36  brain]
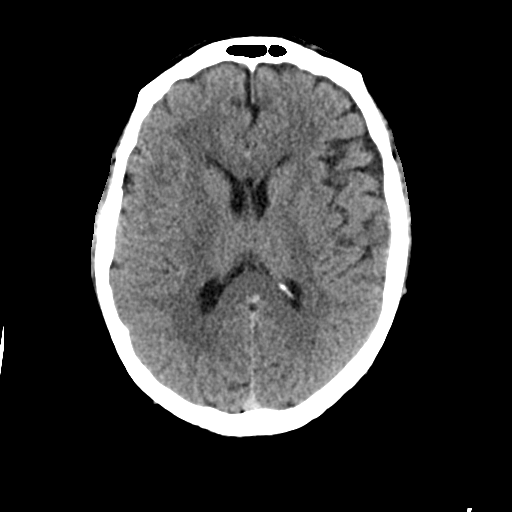
[im 22/36  brain]
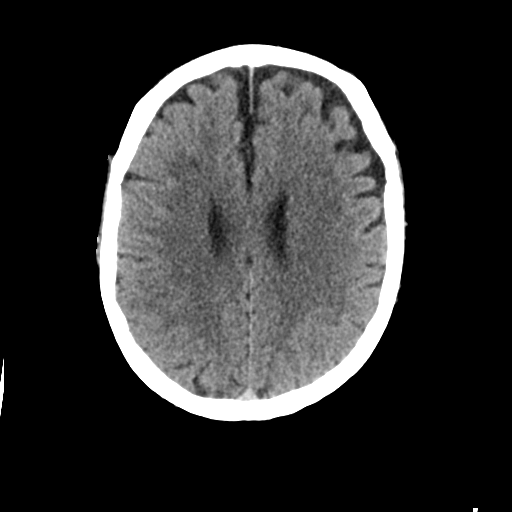
[im 22/36  bone]
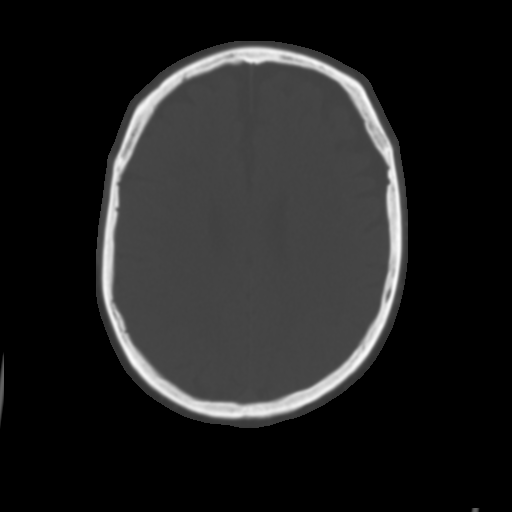
[im 24/36  brain]
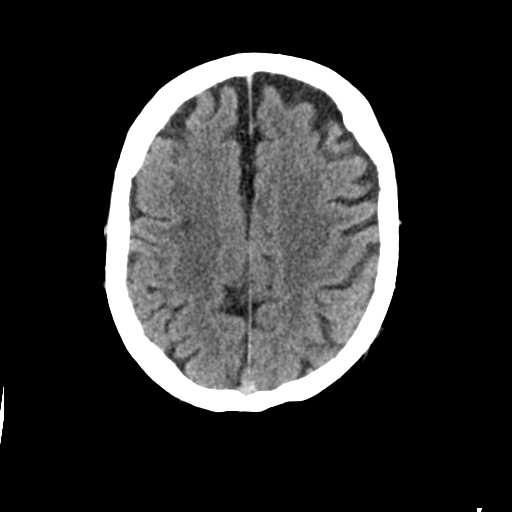
[im 26/36  brain]
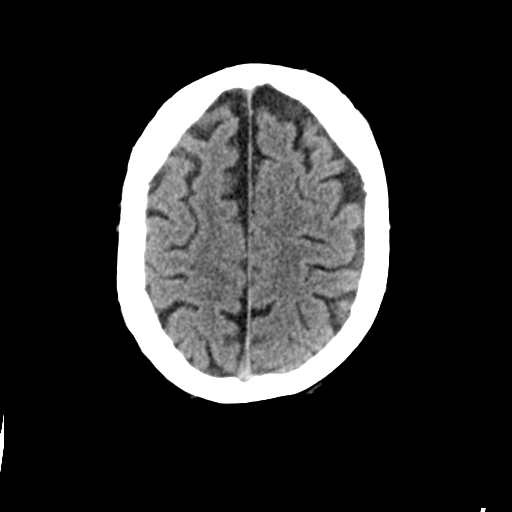
[im 29/36  brain]
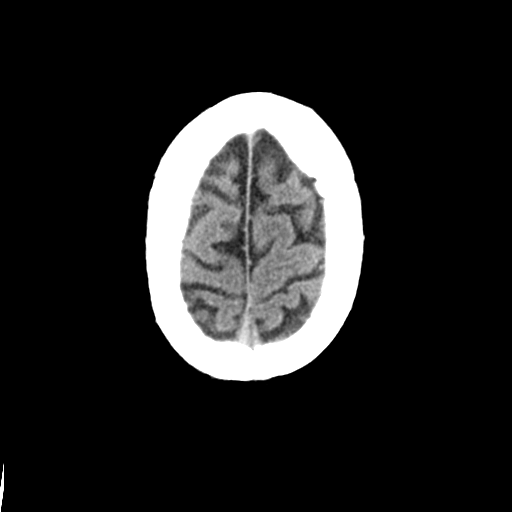
[im 31/36  brain]
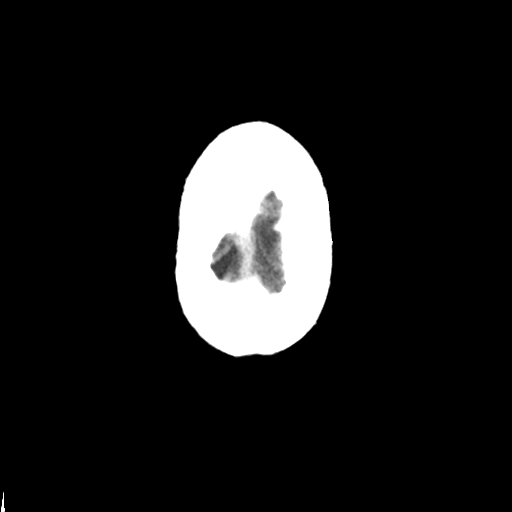
[im 31/36  bone]
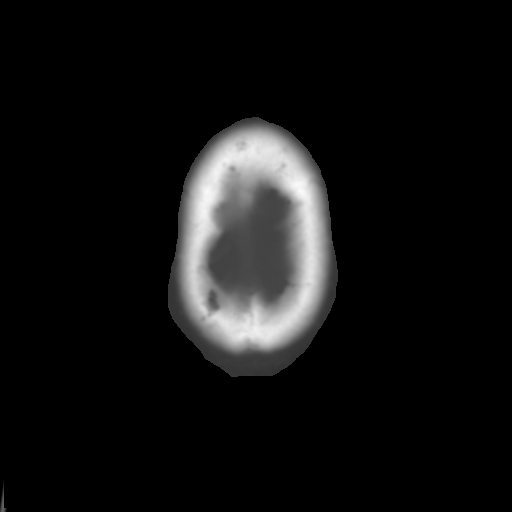
[im 33/36  brain]
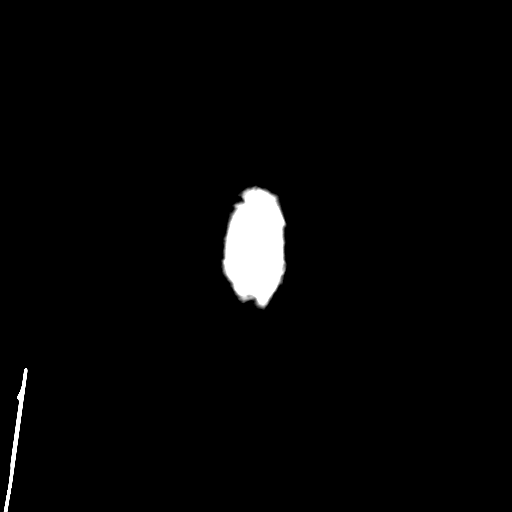

[16 of 30 positions shown; findings below may reference images not displayed]

FINDINGS: No skull fracture is noted. Paranasal sinuses shows mucosal
thickening left maxillary sinus. Stable postsurgical changes left
orbital floor and medial orbit.

Mild atherosclerotic calcifications of carotid siphon.

No intracranial hemorrhage, mass effect or midline shift. Stable
cerebral atrophy. Stable mild subcortical chronic white matter
disease. No definite evidence of acute cortical infarction. No mass
lesion is noted on this unenhanced scan. Ventricular size is stable
from prior exam.
IMPRESSION: No acute intracranial abnormality. Stable chronic changes as
described above.

## 2016-05-04 NOTE — Progress Notes (Signed)
Patient here for lab visit only 

## 2016-05-05 IMAGING — MR MR MRA HEAD W/O CM
1 series · 19 of 48 positions shown · non-contrast
Comparison: 08/01/2014

CLINICAL DATA: New onset left-sided weakness.

EXAM:
MRA HEAD WITHOUT CONTRAST
TECHNIQUE: Angiographic images of the Circle of Willis were obtained using MRA
technique without intravenous contrast.

[Series 2: (id) mt fs · axial · 1.4mm · 0.43mm/px · z∈[-30,+61]mm · 19 of 136 slices shown]
[im 1/136]
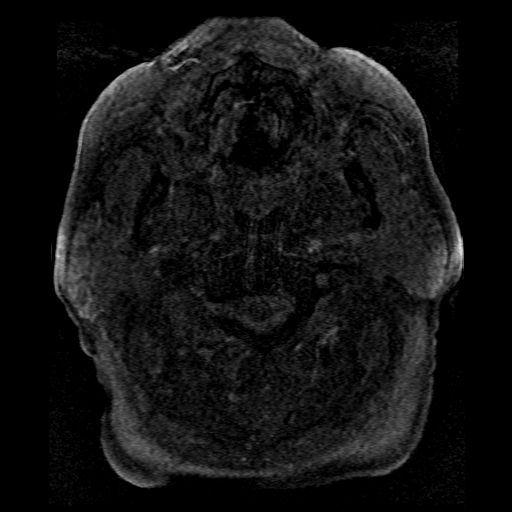
[im 3/136]
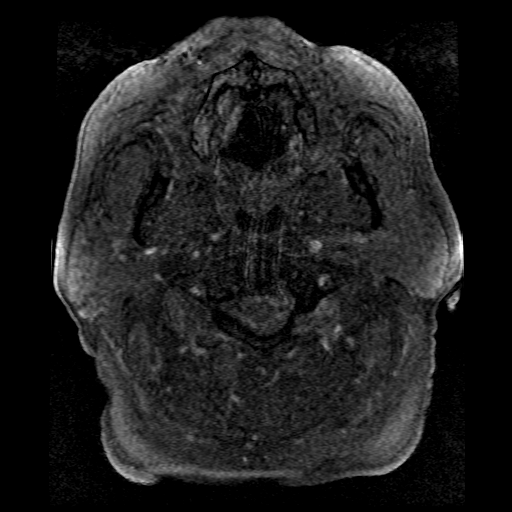
[im 6/136]
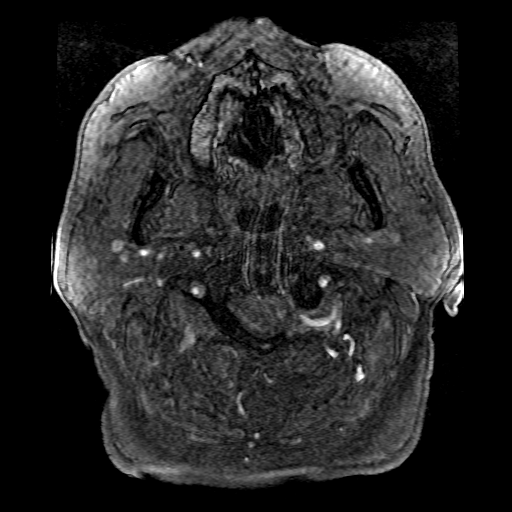
[im 9/136]
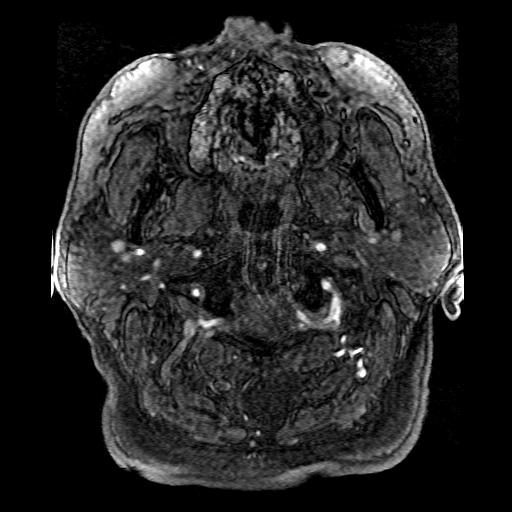
[im 12/136]
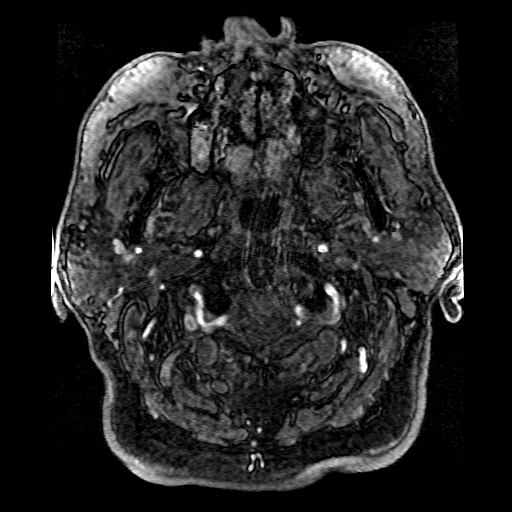
[im 15/136]
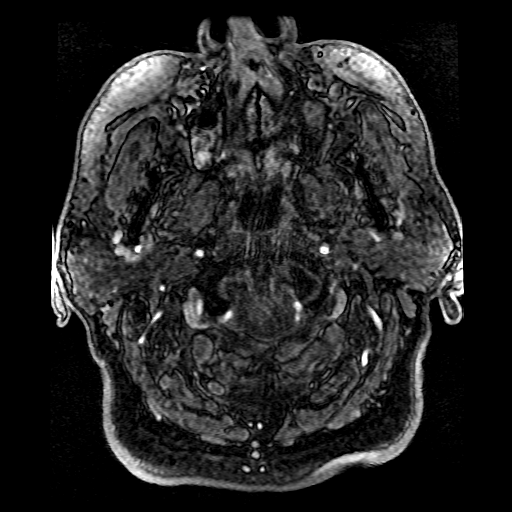
[im 18/136]
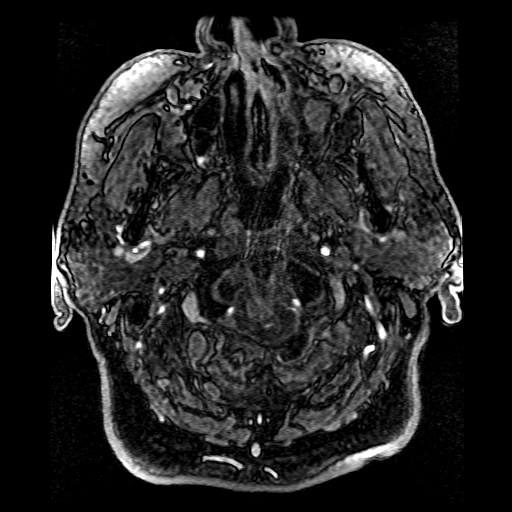
[im 21/136]
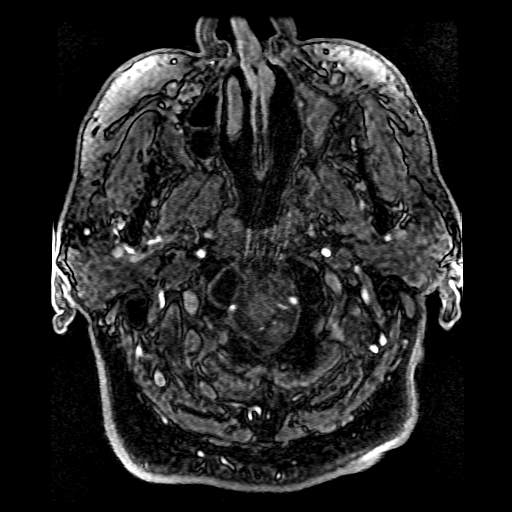
[im 23/136]
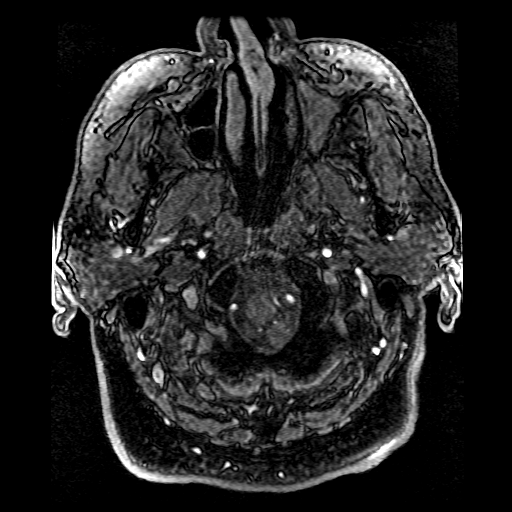
[im 26/136]
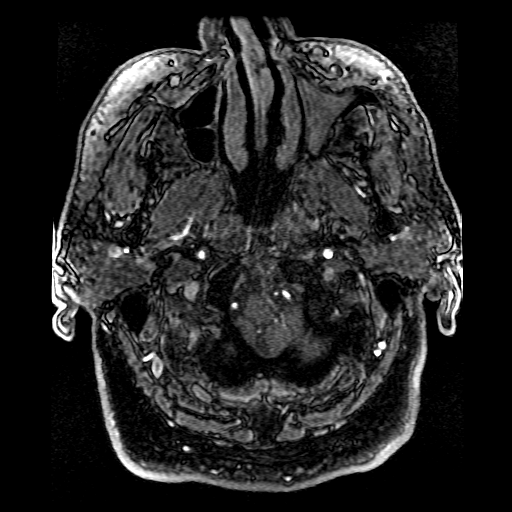
[im 29/136]
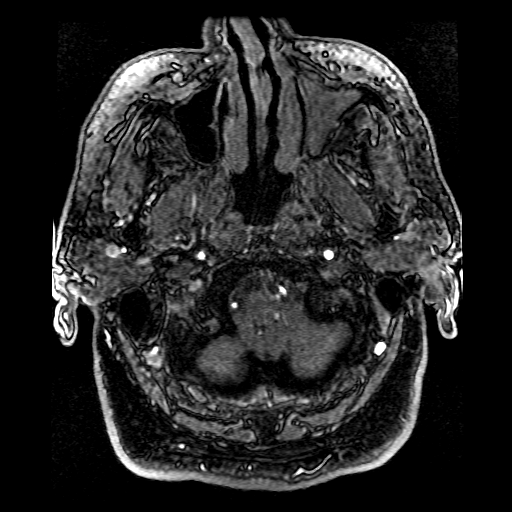
[im 44/136]
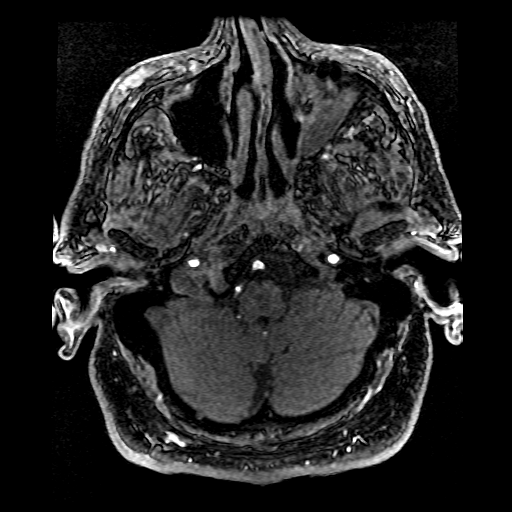
[im 61/136]
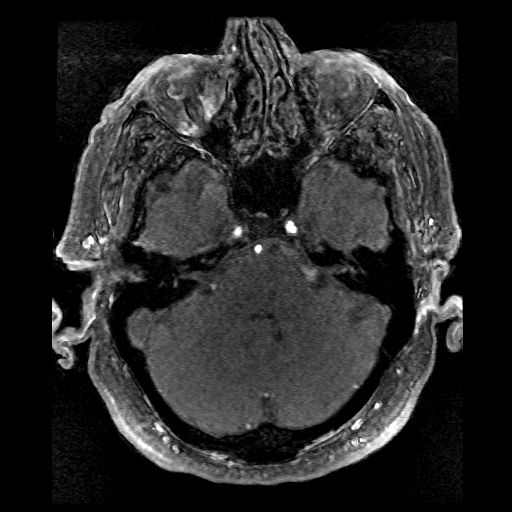
[im 69/136]
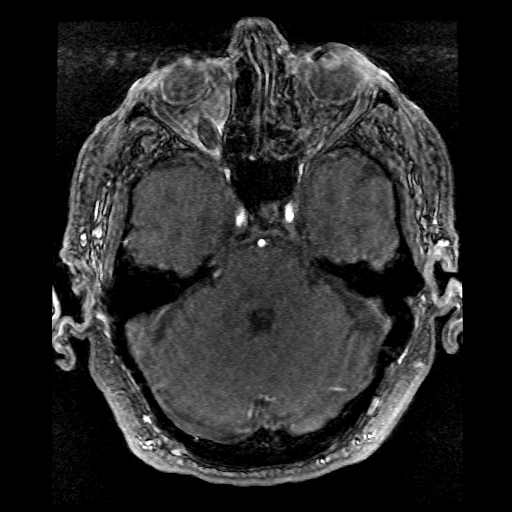
[im 78/136]
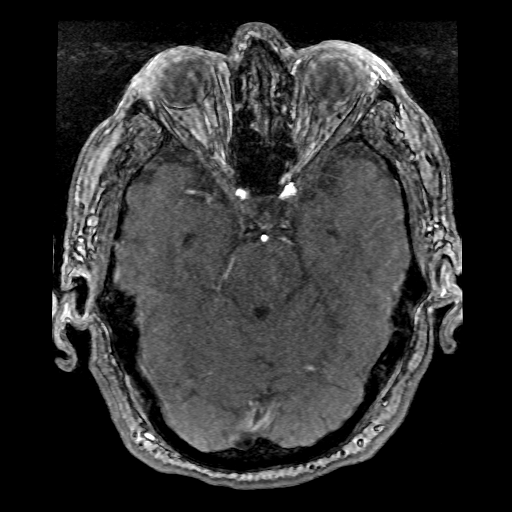
[im 95/136]
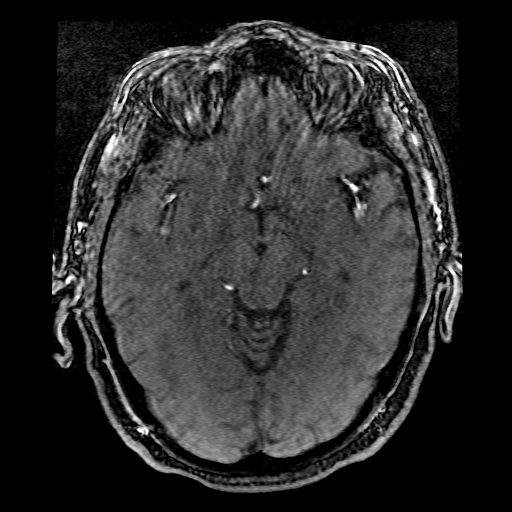
[im 113/136]
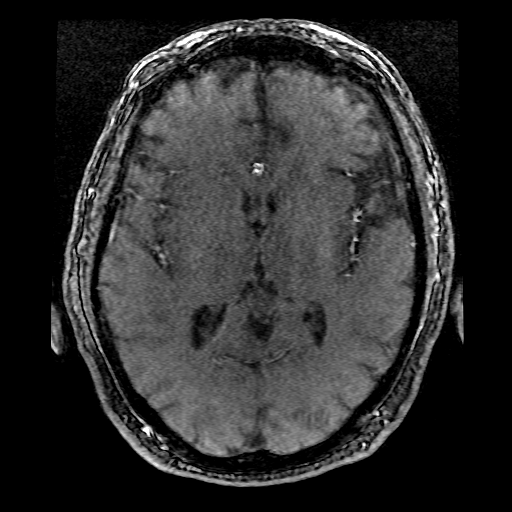
[im 115/136]
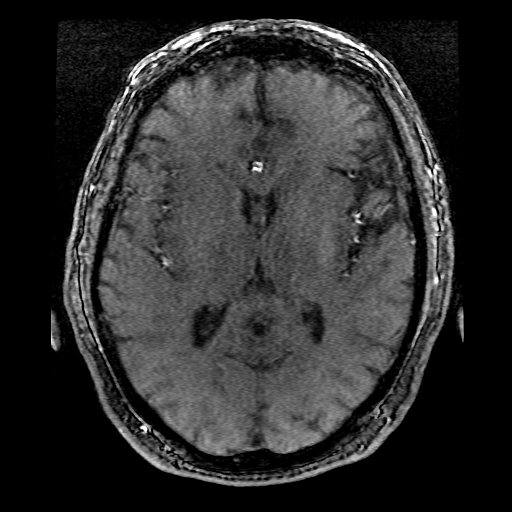
[im 130/136]
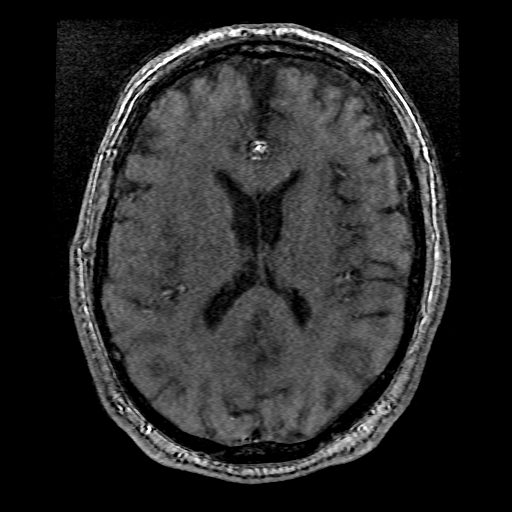

[19 of 48 positions shown; findings below may reference images not displayed]

FINDINGS: Motion degraded study which could obscure findings, especially in
the medium and small size arteries.

Symmetric vertebral arteries. There is early branching of the left
PICA, near the dural penetration. Symmetric carotid arteries. A
right posterior communicating and anterior communicating artery is
present.

The right cervical ICA is mildly narrowed and irregular in luminal
contour compared to the left. No intramural hematoma seen on prior
conventional MRI. There is mural calcification medially at this
level, consistent with atherosclerotic plaque.

There is a focal high-grade stenosis of the right paraclinoid ICA
with flow gap. Symmetric flow is present within the downstream
vessels. There is also a moderate proximal stenosis involving the
right A1 segment. Anterior communicating artery and right posterior
communicating arteries may compensate for the stenoses. There is no
evidence of aneurysm. No major branch occlusion noted. No posterior
circulation stenosis.
IMPRESSION: 1. High-grade right paraclinoid ICA and moderate right A1 segment
stenoses. These correlate with suspected watershed infarcts on
yesterday's brain MRI.
2. Mild luminal irregularity and narrowing of the upper right
cervical ICA. This is likely atherosclerotic (calcified plaque
present on head CT), but consider neck angiography to exclude a
dissection or proximal stenosis with underfilling.

## 2016-05-06 LAB — CMP14+EGFR
ALT: 15 IU/L (ref 0–44)
AST: 11 IU/L (ref 0–40)
Albumin/Globulin Ratio: 1.9 (ref 1.2–2.2)
Albumin: 3.6 g/dL (ref 3.6–4.8)
Alkaline Phosphatase: 71 IU/L (ref 39–117)
BUN/Creatinine Ratio: 13 (ref 10–24)
BUN: 10 mg/dL (ref 8–27)
Bilirubin Total: 0.3 mg/dL (ref 0.0–1.2)
CALCIUM: 8.9 mg/dL (ref 8.6–10.2)
CO2: 25 mmol/L (ref 18–29)
CREATININE: 0.79 mg/dL (ref 0.76–1.27)
Chloride: 106 mmol/L (ref 96–106)
GFR calc Af Amer: 107 mL/min/{1.73_m2} (ref >59)
GFR, EST NON AFRICAN AMERICAN: 93 mL/min/{1.73_m2} (ref >59)
GLOBULIN, TOTAL: 1.9 g/dL (ref 1.5–4.5)
Glucose: 168 mg/dL — ABNORMAL HIGH (ref 65–99)
Potassium: 4.4 mmol/L (ref 3.5–5.2)
SODIUM: 146 mmol/L — AB (ref 134–144)
Total Protein: 5.5 g/dL — ABNORMAL LOW (ref 6.0–8.5)

## 2016-05-06 LAB — DRUG SCREEN 10 W/CONF, SERUM
Amphetamines, IA: NEGATIVE ng/mL
BARBITURATES, IA: NEGATIVE ug/mL
BENZODIAZEPINES, IA: NEGATIVE ng/mL
COCAINE & METABOLITE, IA: NEGATIVE ng/mL
Methadone, IA: NEGATIVE ng/mL
Opiates, IA: NEGATIVE ng/mL
Oxycodones, IA: NEGATIVE ng/mL
PHENCYCLIDINE, IA: NEGATIVE ng/mL
PROPOXYPHENE, IA: NEGATIVE ng/mL
THC(Marijuana) Metabolite, IA: NEGATIVE ng/mL

## 2016-05-06 IMAGING — MR MR MRA NECK WO/W CM
6 of 10 series · 19 of 48 positions shown · IV contrast (multihance)
Comparison: MRI head 08/01/2014

CLINICAL DATA: Stroke.  Rule out dissection.

EXAM:
MRA NECK WITHOUT AND WITH CONTRAST
TECHNIQUE: Multiplanar and multiecho pulse sequences of the neck were obtained
without and with intravenous contrast. Angiographic images of the
neck were obtained using MRA technique without and with intravenous
contrast.
CONTRAST:  20 mL MultiHance IV

[Series 3: T1 · axial · 5.0mm · 0.47mm/px · z∈[-109,+107]mm · 3 of 37 slices shown (1 of 2)]
[im 1/37]
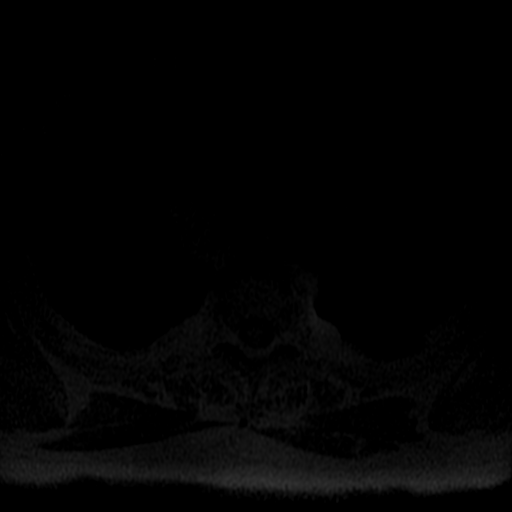
[im 19/37]
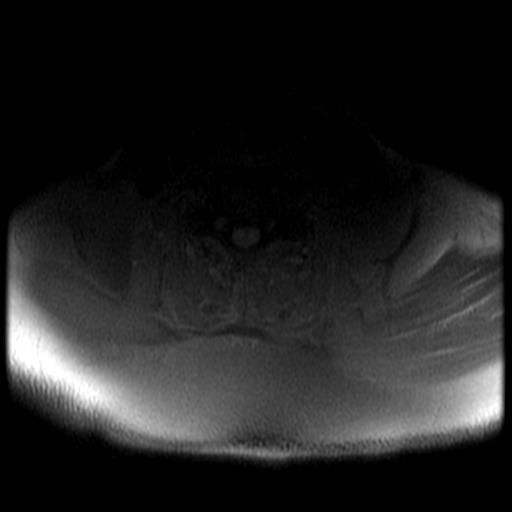
[im 37/37]
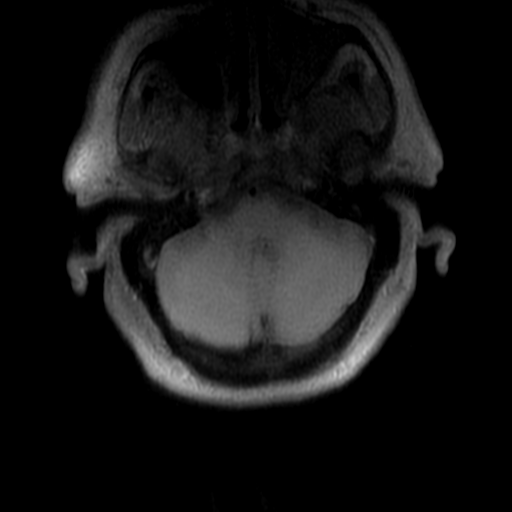

[Series 4: ax (id) · axial · 2.8mm · 0.47mm/px · z∈[-128,+111]mm · 11 of 172 slices shown]
[im 1/172]
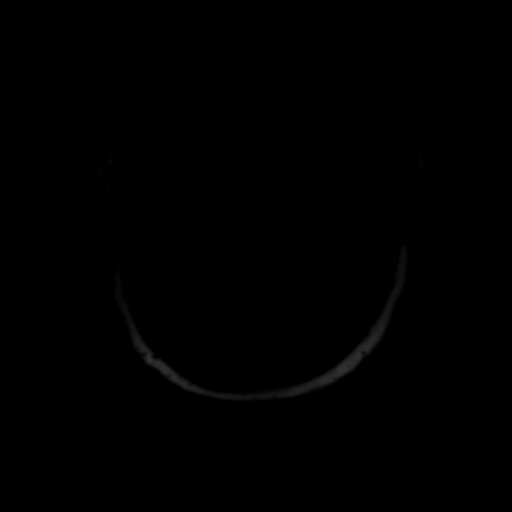
[im 18/172]
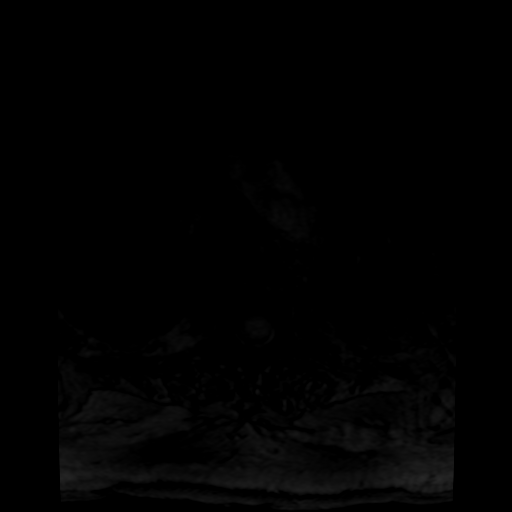
[im 35/172]
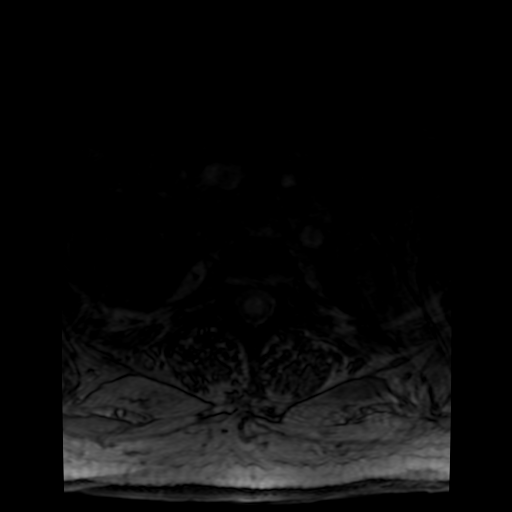
[im 52/172]
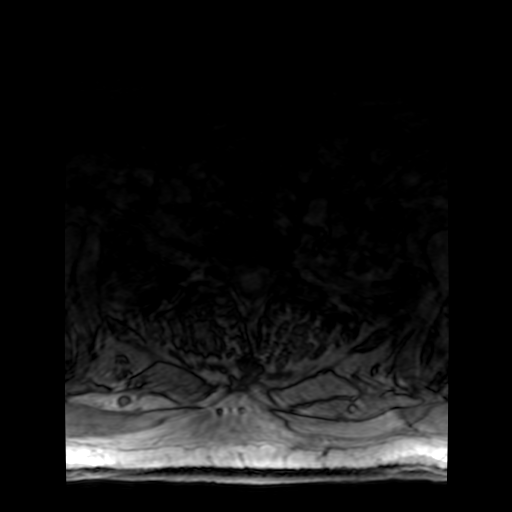
[im 69/172]
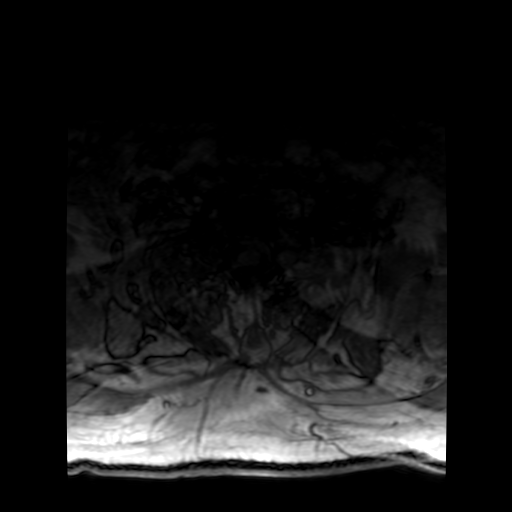
[im 86/172]
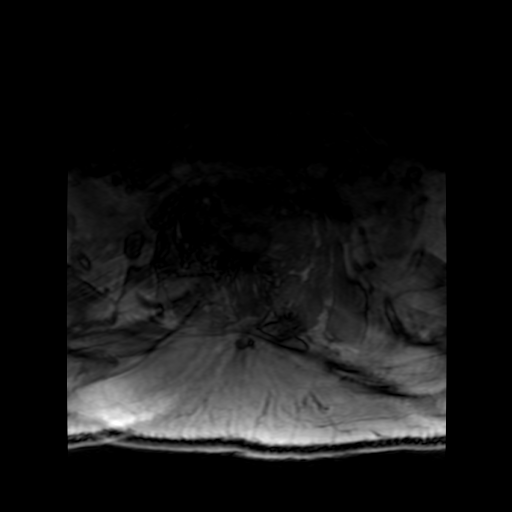
[im 103/172]
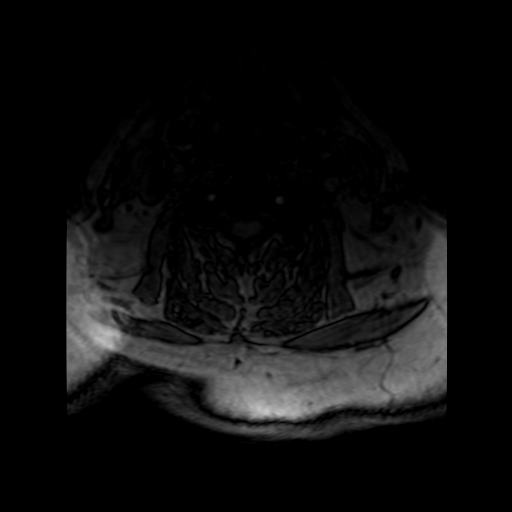
[im 120/172]
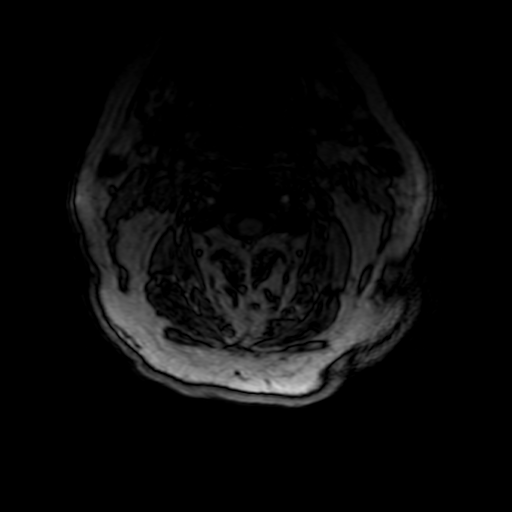
[im 137/172]
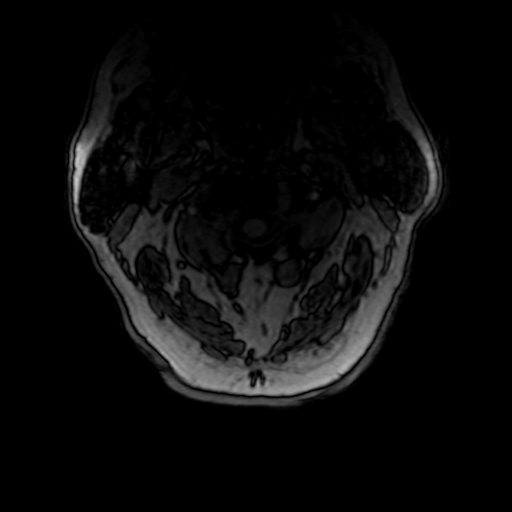
[im 154/172]
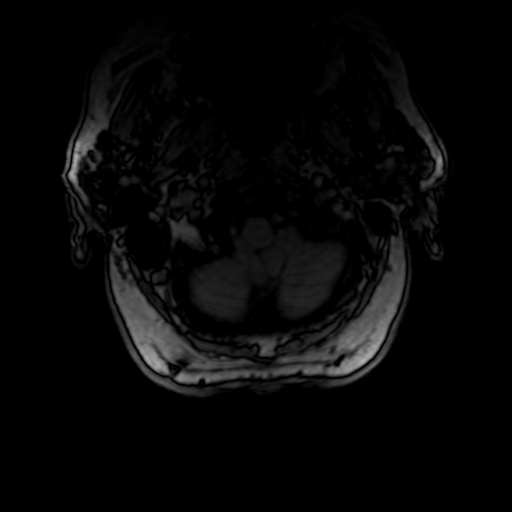
[im 172/172]
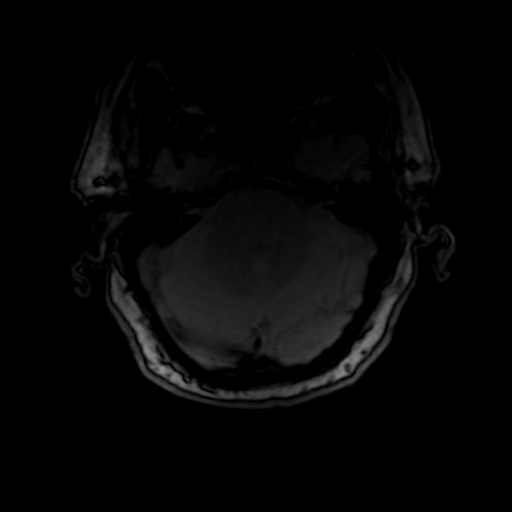

[Series 5: T1 · axial · 5.0mm · 0.47mm/px · z∈[-109,+107]mm · 2 of 37 slices shown (2 of 2)]
[im 1/37]
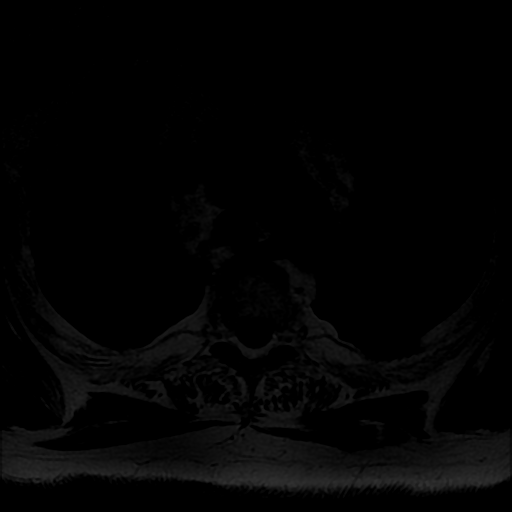
[im 37/37]
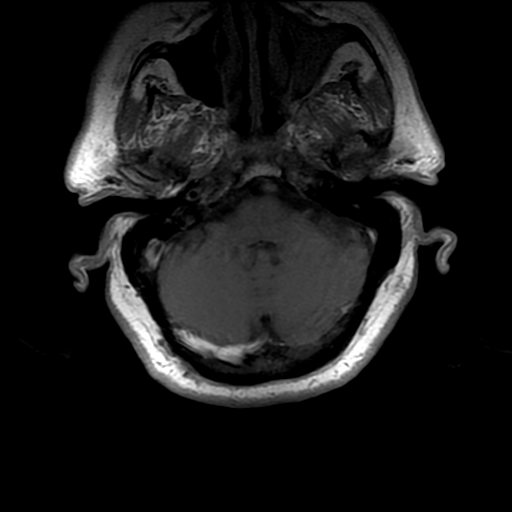

[Series 400: col:ax (id) · axial · 2.8mm · 0.47mm/px · 1 of 1 slices shown]
[im 1/1]
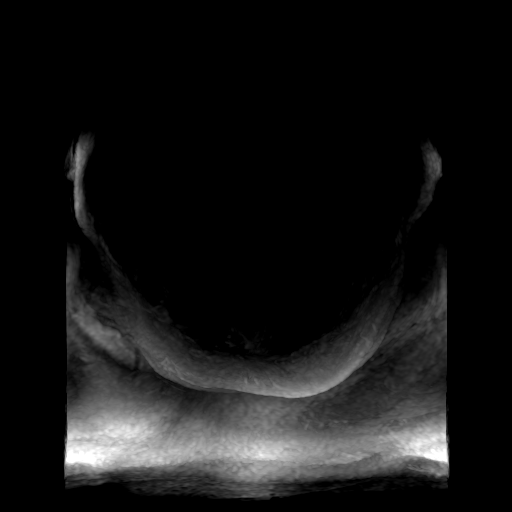

[Series 401: pjn:ax (id) · sagittal · 2.8mm · 0.47mm/px · 1 of 19 slices shown]
[im 1/19]
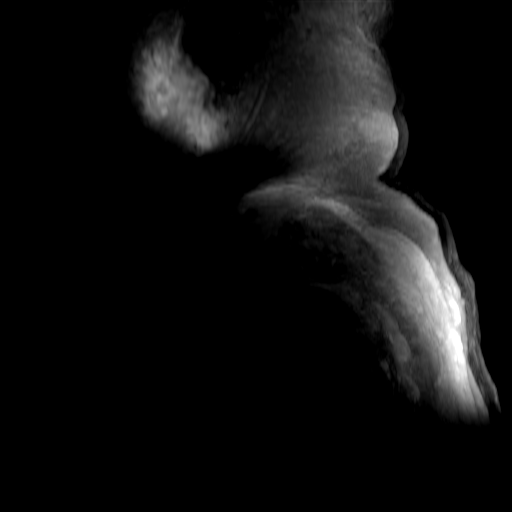

[Series 600: cor cemra ft · coronal · 1.4mm · 0.59mm/px · 1 of 92 slices shown]
[im 1/92]
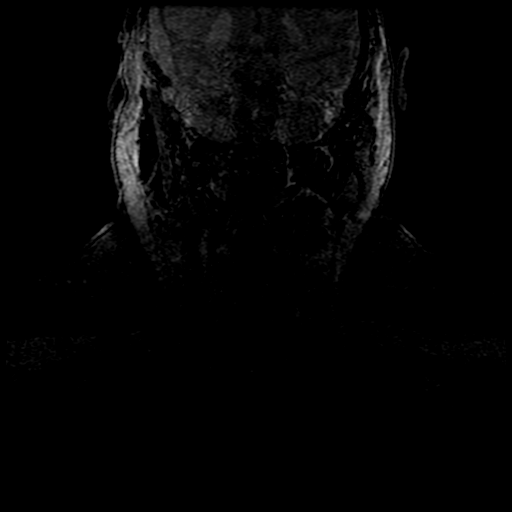

[19 of 48 positions shown; findings below may reference images not displayed]

FINDINGS: Aortic arch is normal. Three vessel aortic arch. Proximal great
vessels widely patent

Both vertebral arteries widely patent to the basilar. Mild to
moderate stenosis of the origin of the right vertebral artery with
decreased signal some which may be due to artifact.

Right carotid artery widely patent. No significant carotid stenosis
or dissection. There is a critical stenosis of the supraclinoid
internal carotid artery on the right as noted on the intracranial
MRA.

Left carotid bifurcation widely patent. Negative for left carotid
stenosis or dissection.
IMPRESSION: Critical focal stenosis of the supraclinoid internal carotid artery
on the right, consistent with atherosclerotic disease

Negative for carotid or vertebral artery dissection. Mild to
moderate stenosis proximal right vertebral artery.

## 2016-05-11 ENCOUNTER — Telehealth: Payer: Self-pay

## 2016-05-11 NOTE — Telephone Encounter (Signed)
-----   Message from Arnoldo Morale, MD sent at 05/07/2016  2:48 PM EDT ----- Blood work is stable; drug screen is negative for Tylenol 3 which he receives from me. I will no longer be able to prescribe this for him for pain control.

## 2016-05-11 NOTE — Telephone Encounter (Signed)
Writer called patient with test results and also explained that Dr. [name] would no longer be prescribing tylenol #3 for patient.  Patient stated understanding and also stated that he has not taken tylenol # 3 since January .

## 2016-05-20 ENCOUNTER — Other Ambulatory Visit (HOSPITAL_COMMUNITY): Payer: Self-pay | Admitting: Interventional Radiology

## 2016-05-20 DIAGNOSIS — I771 Stricture of artery: Secondary | ICD-10-CM

## 2016-05-22 IMAGING — MR MR HEAD W/O CM
9 of 11 series · 33 of 48 positions shown · non-contrast
Comparison: Head CT same day.  MRI 08/01/2014.

CLINICAL DATA: Recurrence of left-sided weakness with left arm
paralysis. Recent infarctions 08/01/2014.

EXAM:
MRI HEAD WITHOUT CONTRAST
TECHNIQUE: Multiplanar, multiecho pulse sequences of the brain and surrounding
structures were obtained without intravenous contrast.

[Series 3: DWI · axial · 3.0mm · 0.94mm/px · z∈[-65,+97]mm · 9 of 110 slices shown (1 of 4)]
[im 1/110]
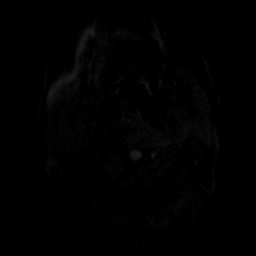
[im 14/110]
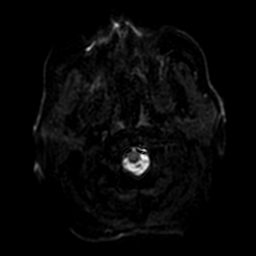
[im 28/110]
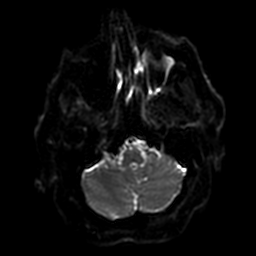
[im 41/110]
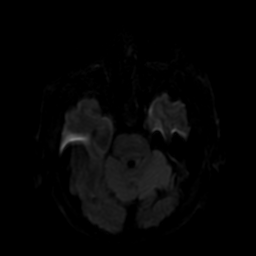
[im 55/110]
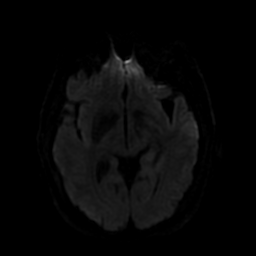
[im 69/110]
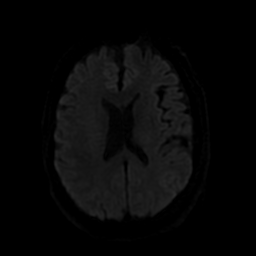
[im 82/110]
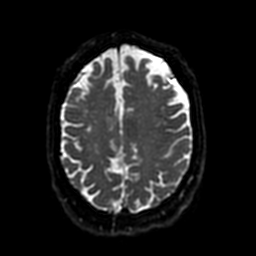
[im 96/110]
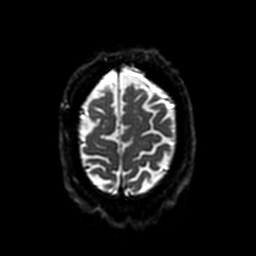
[im 110/110]
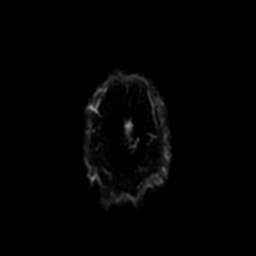

[Series 6: FLAIR · sagittal · 5.0mm · 0.47mm/px · 2 of 25 slices shown (1 of 2)]
[im 1/25]
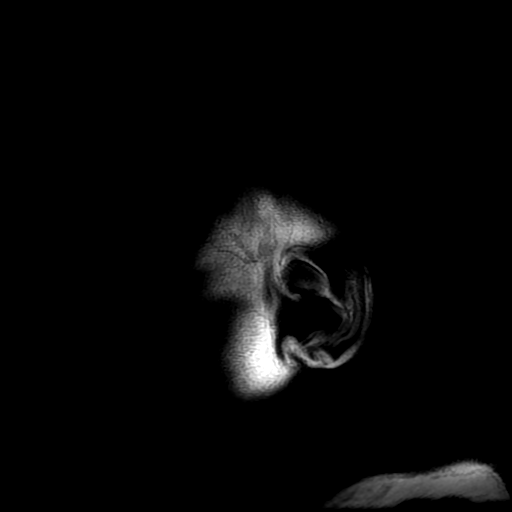
[im 25/25]
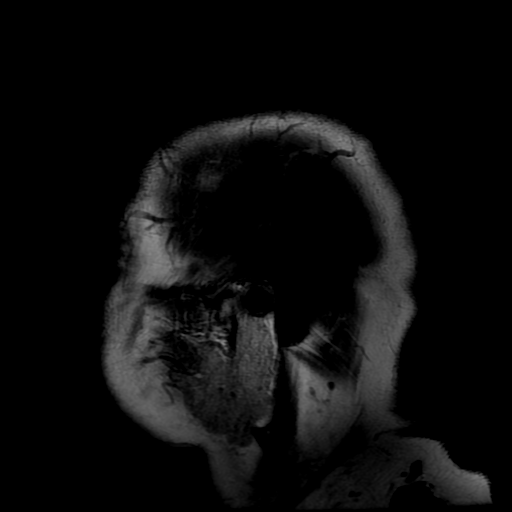

[Series 7: T2 · axial · 5.0mm · 0.47mm/px · z∈[-60,+114]mm · 2 of 30 slices shown (1 of 2)]
[im 1/30]
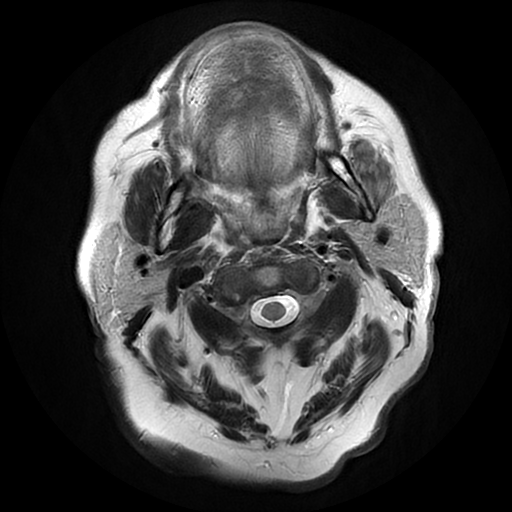
[im 30/30]
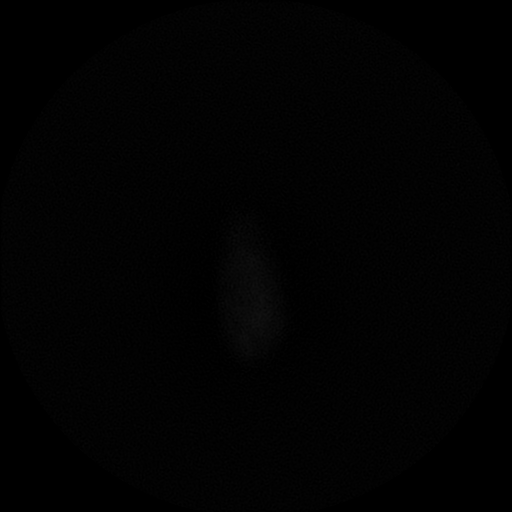

[Series 8: FLAIR · axial · 5.0mm · 0.47mm/px · z∈[-60,+114]mm · 2 of 30 slices shown (2 of 2)]
[im 1/30]
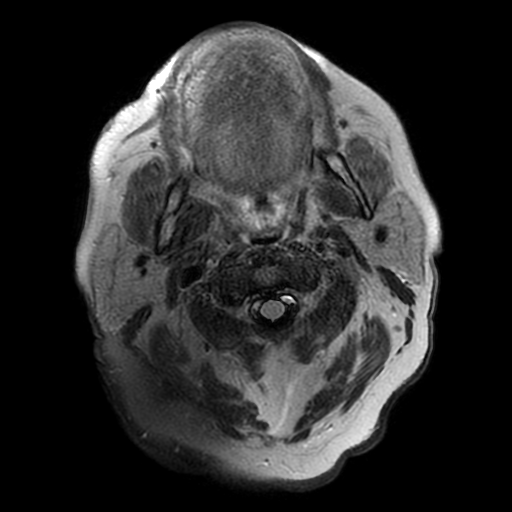
[im 30/30]
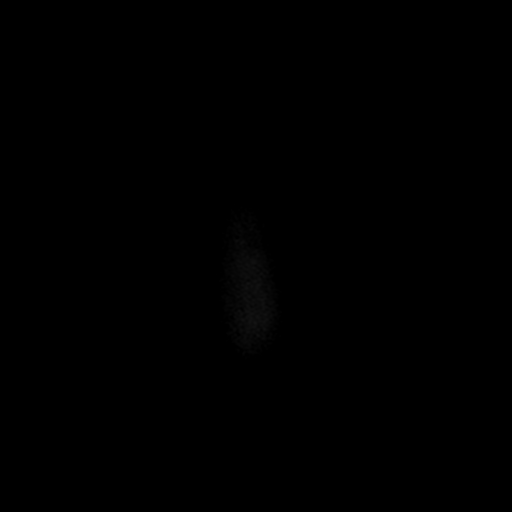

[Series 9: DWI · coronal · 5.0mm · 0.94mm/px · 5 of 76 slices shown (2 of 4)]
[im 1/76]
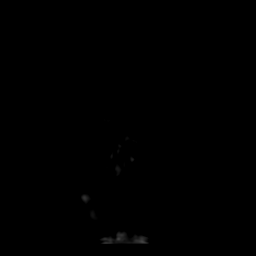
[im 19/76]
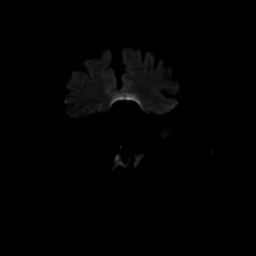
[im 38/76]
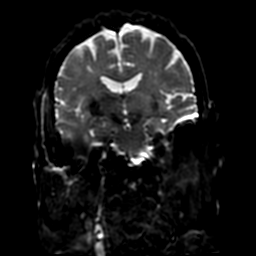
[im 57/76]
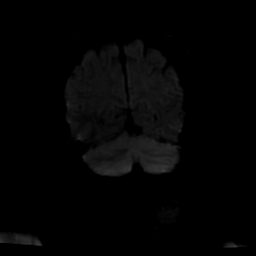
[im 76/76]
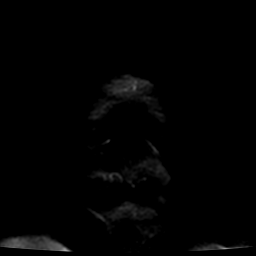

[Series 10: (person_name) · axial · 3.0mm · 0.47mm/px · z∈[-54,+22]mm · 4 of 104 slices shown]
[im 1/104]
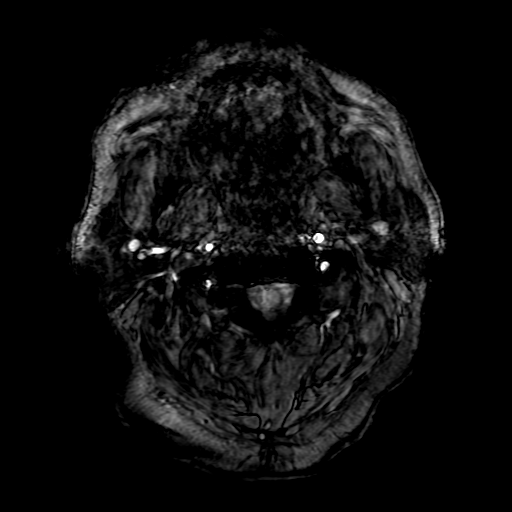
[im 18/104]
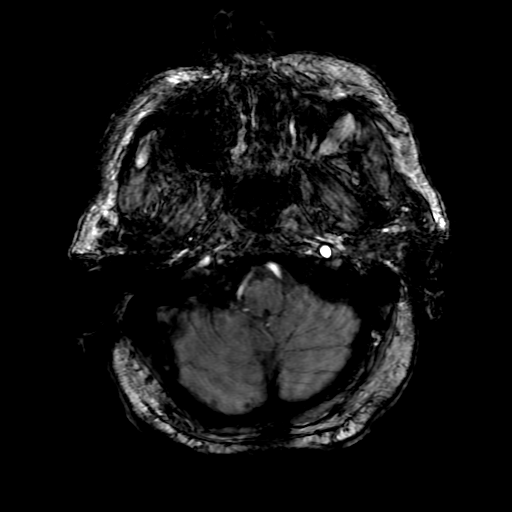
[im 35/104]
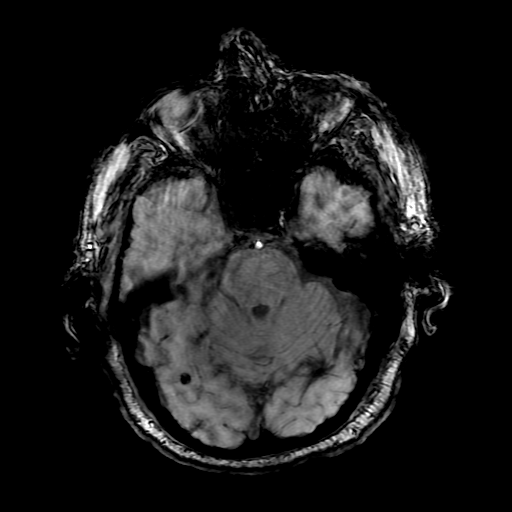
[im 52/104]
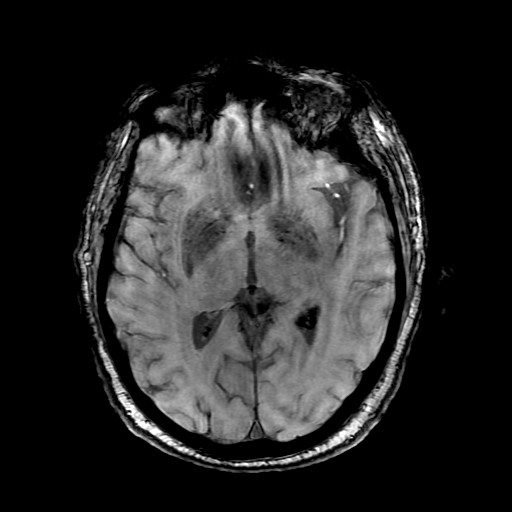

[Series 12: T2 · coronal · 5.0mm · 0.47mm/px · 2 of 33 slices shown (2 of 2)]
[im 1/33]
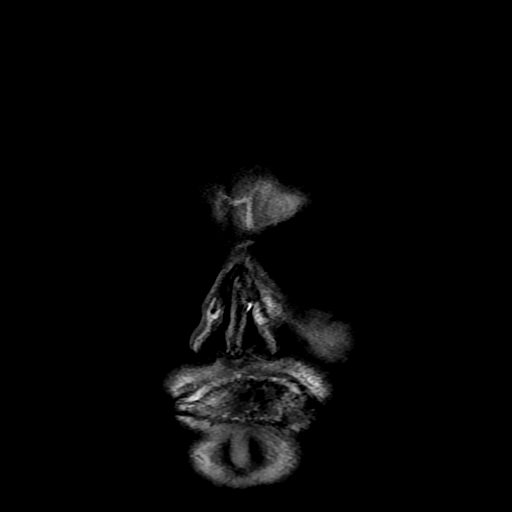
[im 33/33]
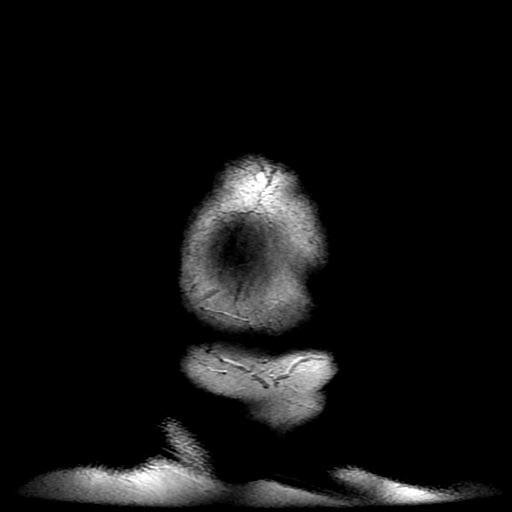

[Series 300: DWI · axial · 3.0mm · 0.94mm/px · z∈[-65,+97]mm · 4 of 54 slices shown (3 of 4)]
[im 1/54]
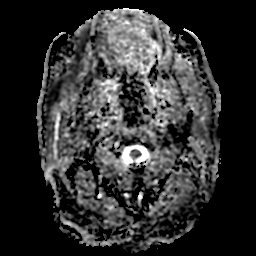
[im 18/54]
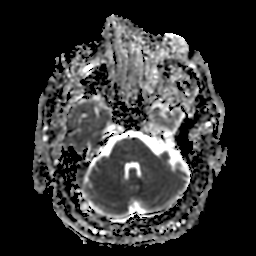
[im 36/54]
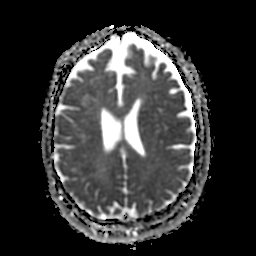
[im 54/54]
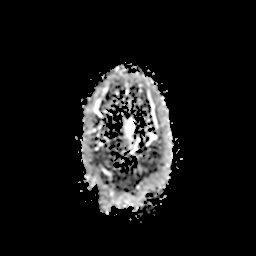

[Series 900: DWI · coronal · 5.0mm · 0.94mm/px · 3 of 38 slices shown (4 of 4)]
[im 1/38]
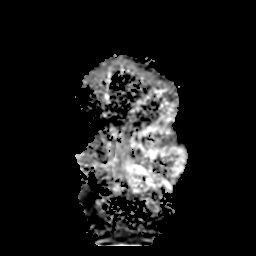
[im 19/38]
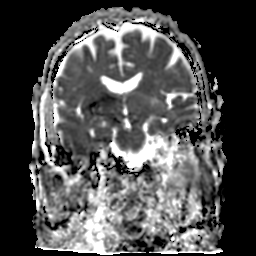
[im 38/38]
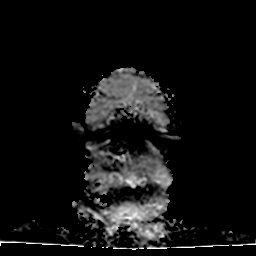

[33 of 48 positions shown; findings below may reference images not displayed]

FINDINGS: There are is again demonstration of small foci of restricted
diffusion in the right frontal lobe at the junction of the anterior
cerebral artery and middle cerebral artery territories, as seen on
[DATE]. However, there is a newly seen 1.5 cm region of acute
infarction affecting the right posterior frontal cortical brain and
a separate area of involvement of an adjacent gyrus anterior to
that. This could be due to new embolic disease or extension of
watershed infarction.

Elsewhere, there are extensive chronic small vessel ischemic changes
throughout the hemispheric white matter. Increased FLAIR signal in
the sulci posteriorly could be due to increased protein content of
the spinal fluid. No evidence of susceptibility artifact to suggest
frank hemorrhage. 6 mm left frontal convexity meningioma is
unchanged in insignificant. No evidence hydrocephalus or extra-axial
fluid collection. Mucosal inflammatory changes of the left maxillary
sinus are again demonstrated.
IMPRESSION: Newly seen foci of acute infarction in the right posterior frontal
cortex near the vertex. This could represent extension of watershed
infarction or could be new embolic infarctions. No mass effect or
hemorrhage in this location.

Extensive chronic small vessel ischemic changes elsewhere throughout
the brain.

Increased FLAIR signal in the sulci posteriorly could relate to
increased protein content of the CSF.

## 2016-05-22 IMAGING — CR DG CHEST 1V PORT
1 series · 1 of 1 positions shown · non-contrast
Comparison: 08/01/2014

CLINICAL DATA: Shortness of breath. Recent stroke. Cough. History
of COPD and emphysema.

EXAM:
PORTABLE CHEST - 1 VIEW

[AP]
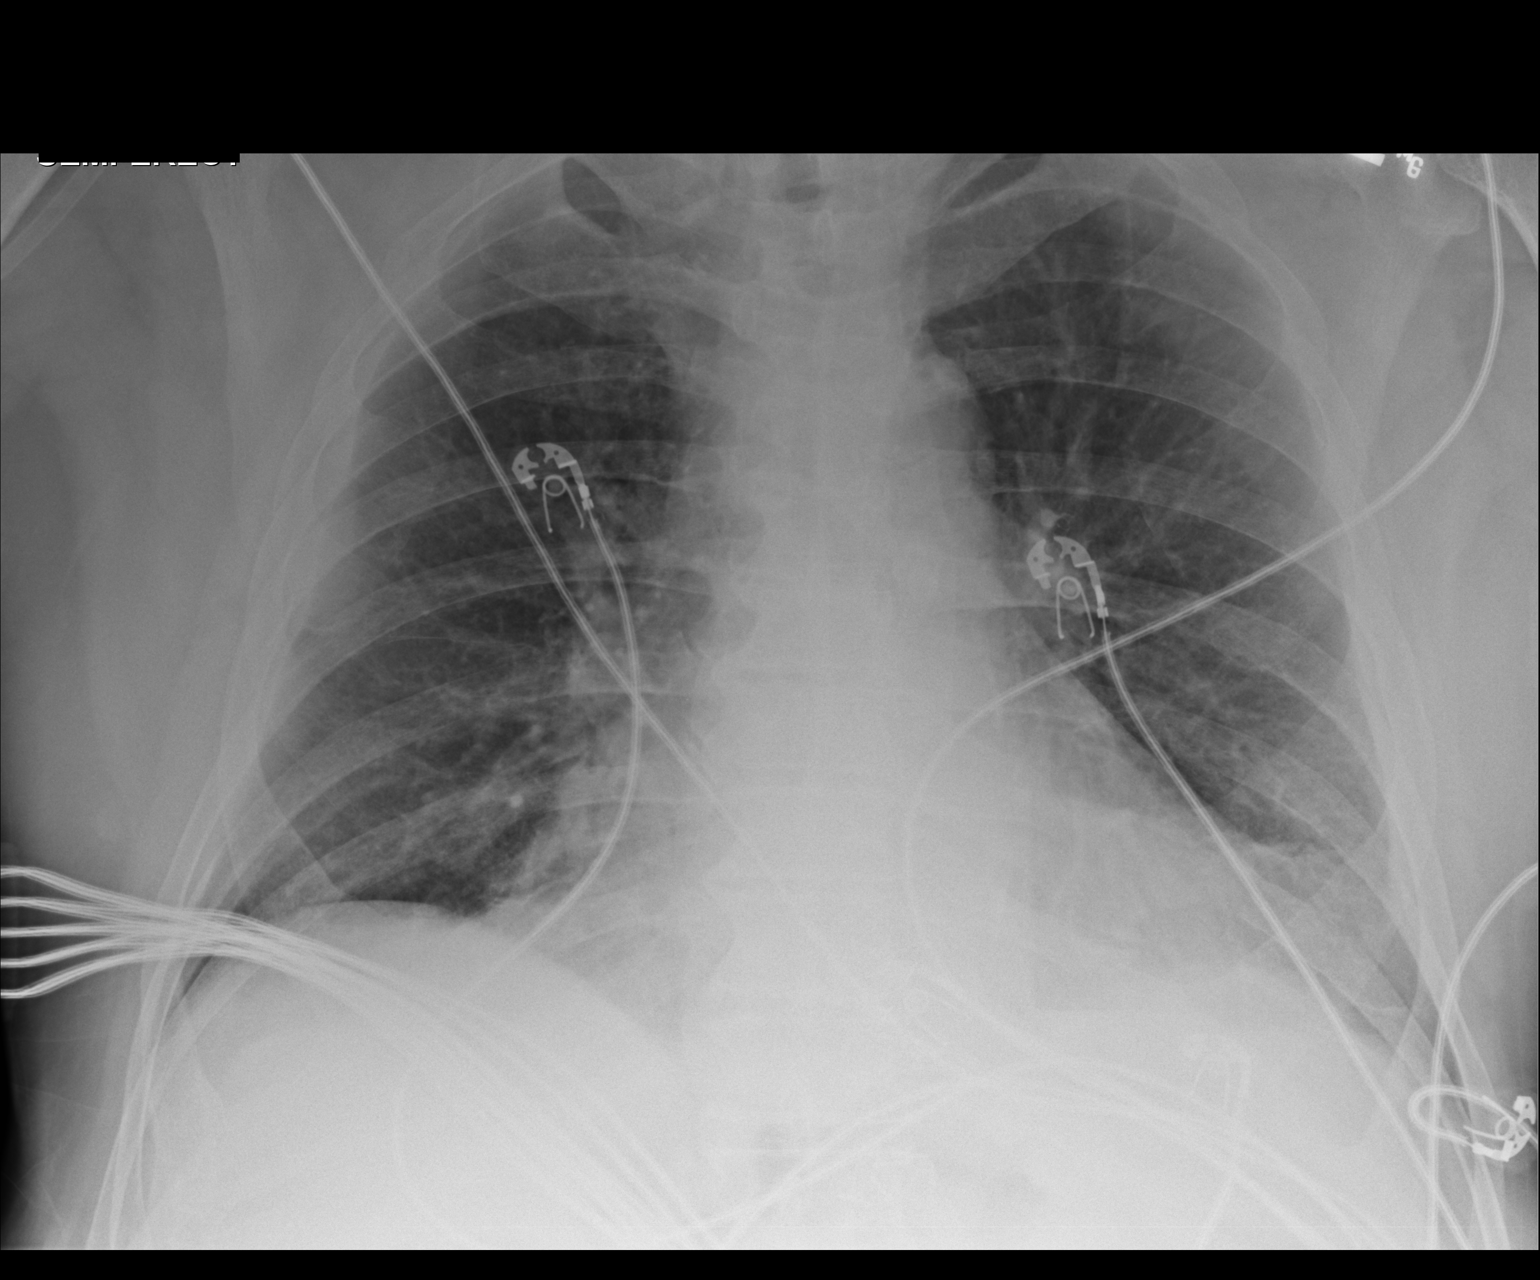

[1 of 1 positions shown; findings below may reference images not displayed]

FINDINGS: Mild cardiac enlargement without significant vascular congestion.
Linear atelectasis in the left lung base. No focal airspace disease.
No blunting of costophrenic angles. No pneumothorax. Mediastinal
contours appear intact. Degenerative changes in the spine.
IMPRESSION: Mild cardiac enlargement. Linear atelectasis in the left lung base.
No evidence of active pulmonary disease.

## 2016-05-22 IMAGING — CT CT HEAD W/O CM
1 series · 15 of 30 positions shown, 19 images · non-contrast
Comparison: MRI dated 08/02/2014

CLINICAL DATA: 64-year-old male with high blood pressure and
left-sided numbness and weakness

EXAM:
CT HEAD WITHOUT CONTRAST
TECHNIQUE: Contiguous axial images were obtained from the base of the skull
through the vertex without intravenous contrast.

[Series 2: head 5.0 h30s · axial · 0.51mm/px · z∈[-130,+10]mm · 15 of 32 slices shown, 19 images]
[im 2/32  brain]
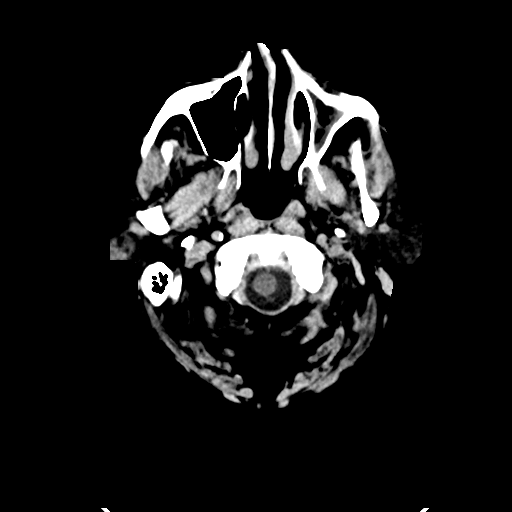
[im 2/32  bone]
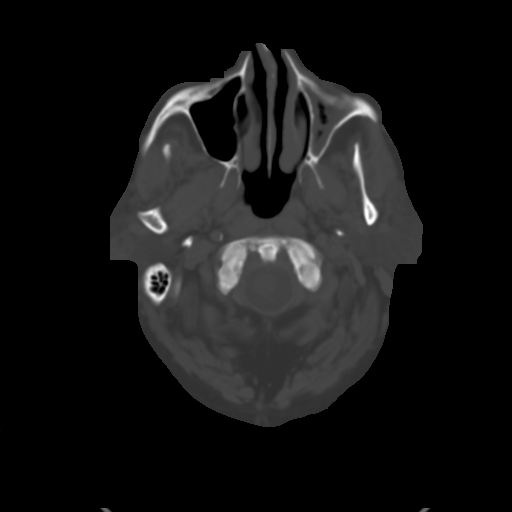
[im 4/32  brain]
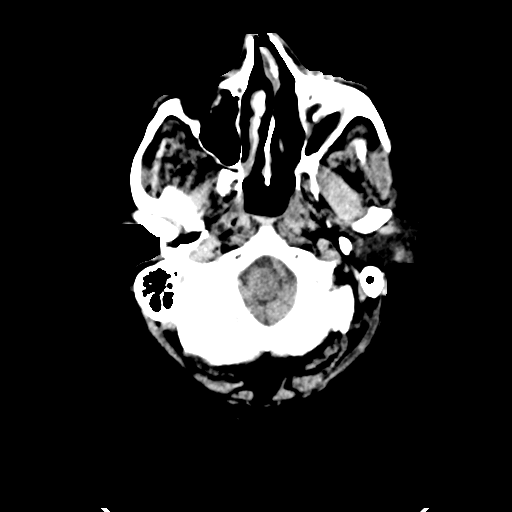
[im 6/32  brain]
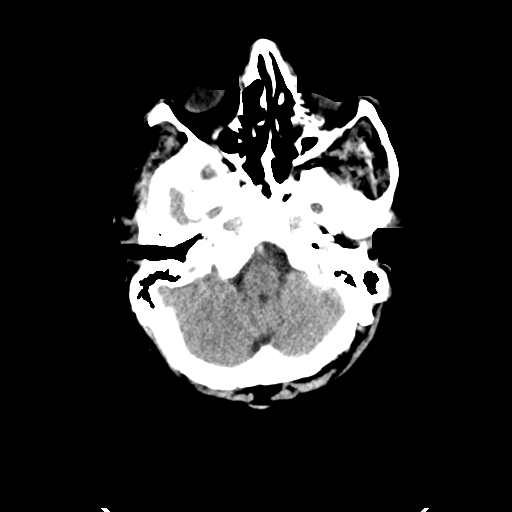
[im 8/32  brain]
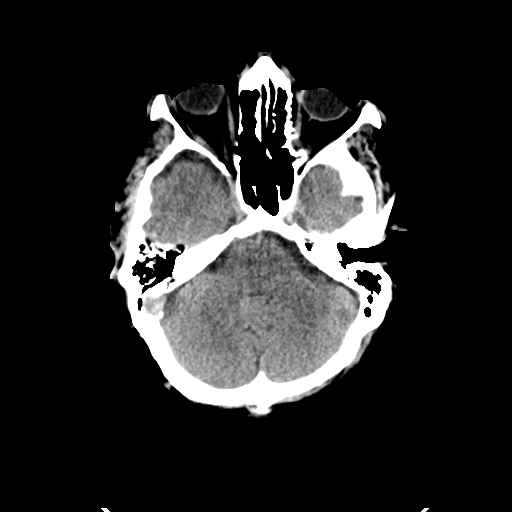
[im 10/32  brain]
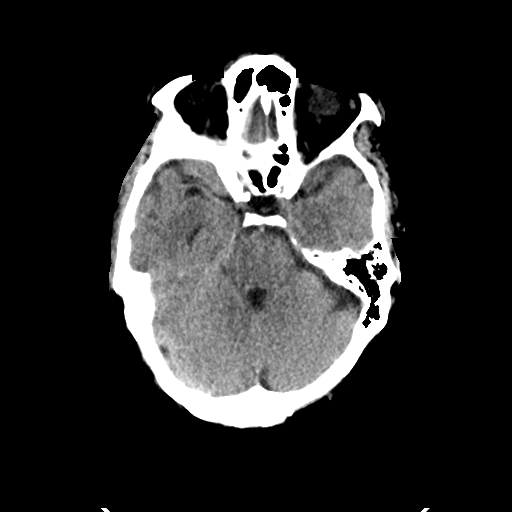
[im 10/32  bone]
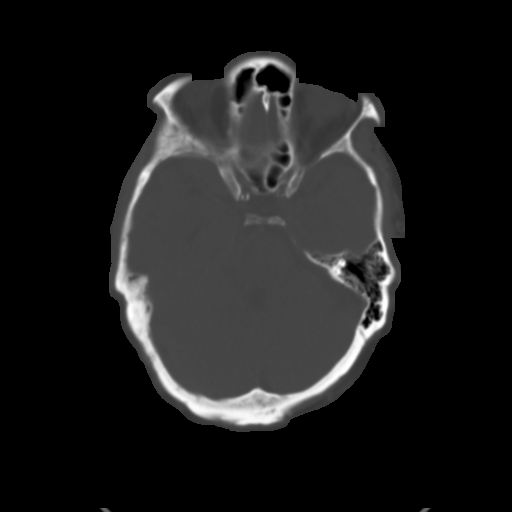
[im 12/32  brain]
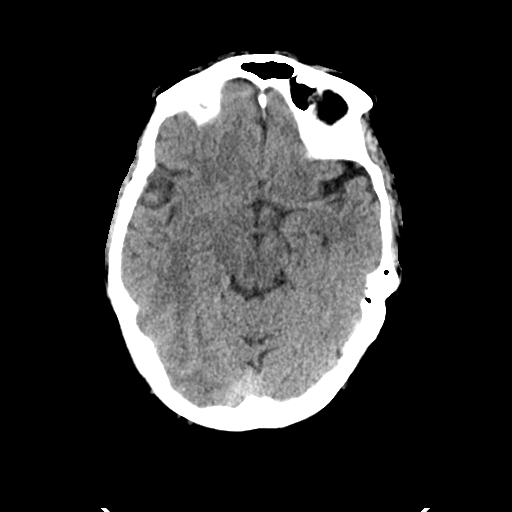
[im 14/32  brain]
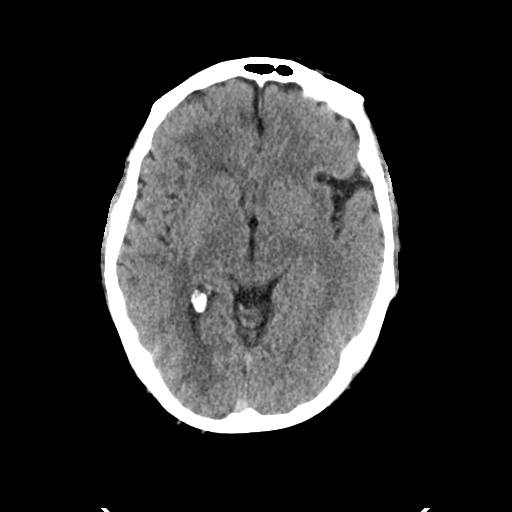
[im 17/32  brain]
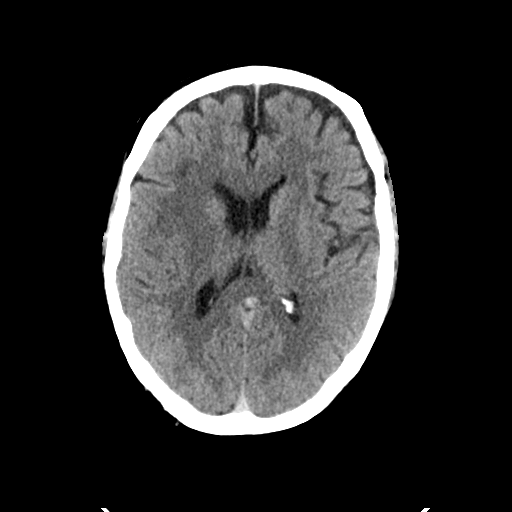
[im 18/32  brain]
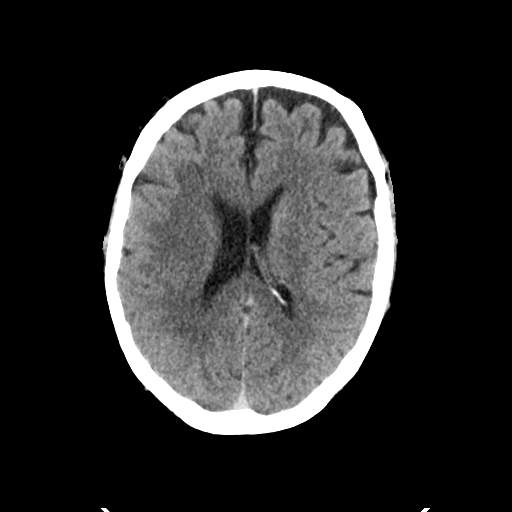
[im 18/32  bone]
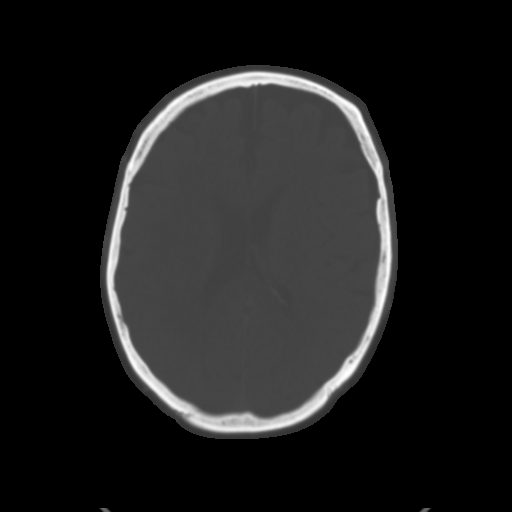
[im 20/32  brain]
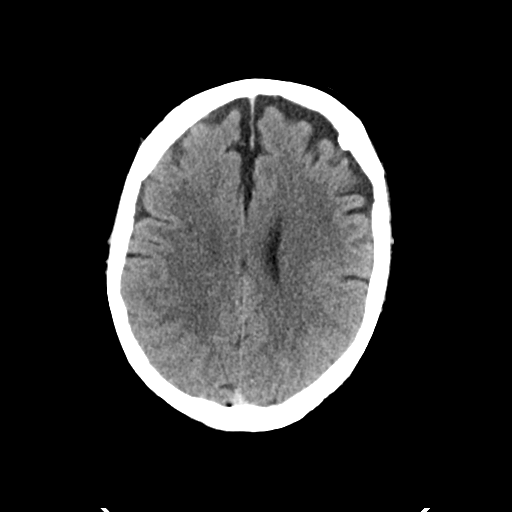
[im 22/32  brain]
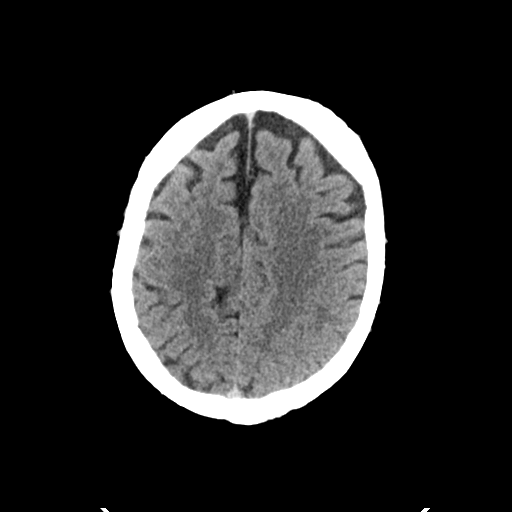
[im 24/32  brain]
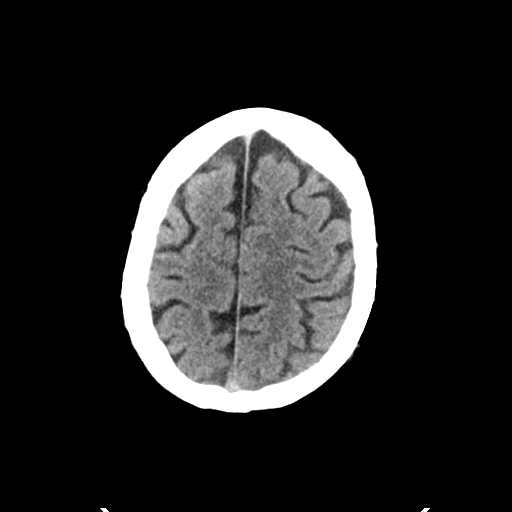
[im 26/32  brain]
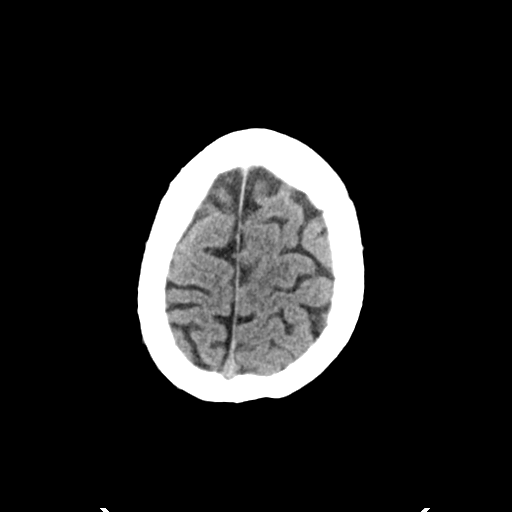
[im 26/32  bone]
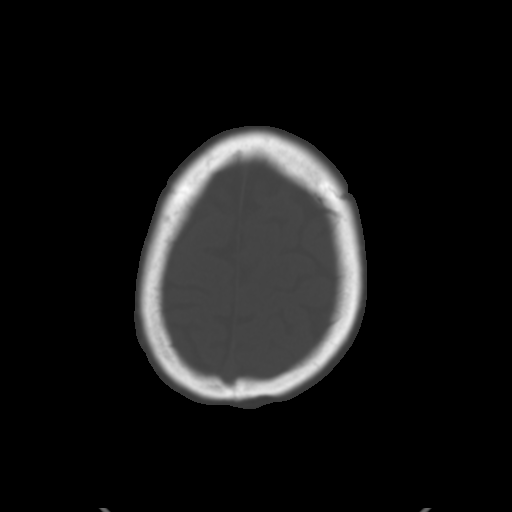
[im 28/32  brain]
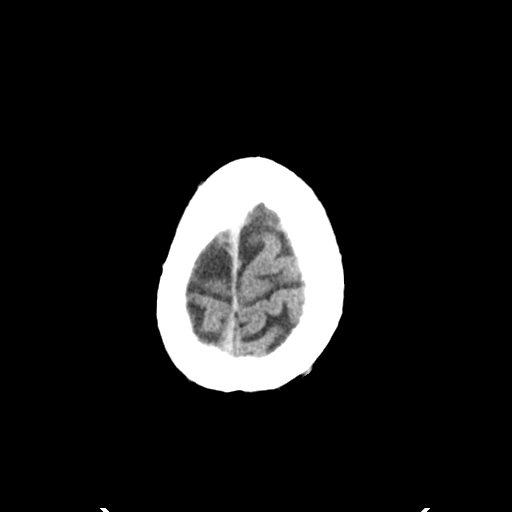
[im 30/32  brain]
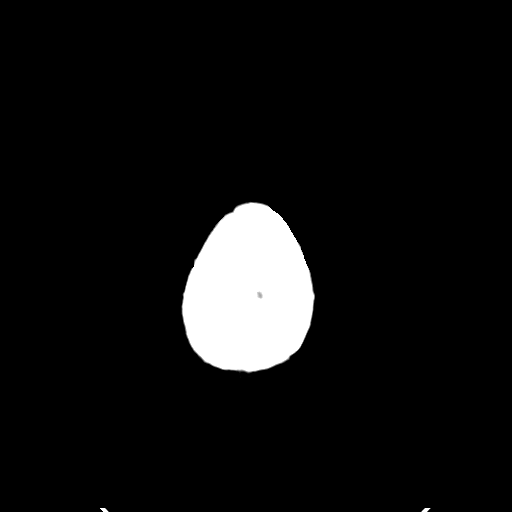

[15 of 30 positions shown; findings below may reference images not displayed]

FINDINGS: There is mild dilatation of the ventricles and sulci compatible with
age-related atrophy. Periventricular and deep white matter
hypodensities represent chronic microvascular ischemic changes.
There is no intracranial hemorrhage. No mass effect or midline shift
identified. An area of heterogeneous hypodensity is noted in the
right occipital lobe (series 2 image 13- 15). This may be
artifactual. Acute ischemia is not excluded. MRI may provide better
evaluation if there is high clinical suspicion for acute infarct.

There are postsurgical changes of left orbital and maxillary
fixation with mesh. There is partial opacification of the left
maxillary sinus. The remainder of the visualized paranasal sinuses
and mastoid air cells are clear. The calvarium is intact.
IMPRESSION: No acute intracranial pathology.

Focal ill-defined hypodensity in the right occipital lobe, possibly
artifactual. Acute ischemia is not excluded. MRI may provide better
evaluation.

Moderate age-related atrophy and chronic microvascular ischemic
disease.

Critical Value/emergent results were called by telephone at the time
of interpretation on 08/19/2014 at [DATE] to Dr. KATHI MEL , who
verbally acknowledged these results.

## 2016-05-26 ENCOUNTER — Other Ambulatory Visit: Payer: Self-pay | Admitting: Family Medicine

## 2016-05-26 DIAGNOSIS — M62838 Other muscle spasm: Secondary | ICD-10-CM

## 2016-05-27 ENCOUNTER — Ambulatory Visit (HOSPITAL_COMMUNITY)
Admission: RE | Admit: 2016-05-27 | Discharge: 2016-05-27 | Disposition: A | Discharge status: Home / Self Care | Payer: Medicare Other | Source: Ambulatory Visit | Attending: Interventional Radiology | Admitting: Interventional Radiology

## 2016-05-27 DIAGNOSIS — I771 Stricture of artery: Secondary | ICD-10-CM

## 2016-05-27 HISTORY — PX: IR RADIOLOGIST EVAL & MGMT: IMG5224

## 2016-05-28 ENCOUNTER — Encounter (HOSPITAL_COMMUNITY): Payer: Self-pay | Admitting: Interventional Radiology

## 2016-05-29 ENCOUNTER — Ambulatory Visit: Payer: Medicare Other | Attending: Family Medicine | Admitting: Family Medicine

## 2016-05-29 ENCOUNTER — Emergency Department (HOSPITAL_COMMUNITY): Payer: Medicare Other

## 2016-05-29 ENCOUNTER — Encounter (HOSPITAL_COMMUNITY): Payer: Self-pay | Admitting: *Deleted

## 2016-05-29 ENCOUNTER — Telehealth: Payer: Self-pay | Admitting: *Deleted

## 2016-05-29 ENCOUNTER — Emergency Department (HOSPITAL_COMMUNITY)
Admission: EM | Admit: 2016-05-29 | Discharge: 2016-05-29 | Disposition: A | Discharge status: Home / Self Care | Payer: Medicare Other | Attending: Emergency Medicine | Admitting: Emergency Medicine

## 2016-05-29 ENCOUNTER — Ambulatory Visit: Payer: Self-pay | Admitting: Family Medicine

## 2016-05-29 DIAGNOSIS — J449 Chronic obstructive pulmonary disease, unspecified: Secondary | ICD-10-CM | POA: Diagnosis not present

## 2016-05-29 DIAGNOSIS — I2510 Atherosclerotic heart disease of native coronary artery without angina pectoris: Secondary | ICD-10-CM | POA: Diagnosis not present

## 2016-05-29 DIAGNOSIS — Z8673 Personal history of transient ischemic attack (TIA), and cerebral infarction without residual deficits: Secondary | ICD-10-CM | POA: Diagnosis not present

## 2016-05-29 DIAGNOSIS — I5030 Unspecified diastolic (congestive) heart failure: Secondary | ICD-10-CM | POA: Insufficient documentation

## 2016-05-29 DIAGNOSIS — I7789 Other specified disorders of arteries and arterioles: Secondary | ICD-10-CM | POA: Diagnosis not present

## 2016-05-29 DIAGNOSIS — Z96659 Presence of unspecified artificial knee joint: Secondary | ICD-10-CM | POA: Diagnosis not present

## 2016-05-29 DIAGNOSIS — R079 Chest pain, unspecified: Secondary | ICD-10-CM | POA: Insufficient documentation

## 2016-05-29 DIAGNOSIS — I110 Hypertensive heart disease with heart failure: Secondary | ICD-10-CM | POA: Diagnosis not present

## 2016-05-29 DIAGNOSIS — F17210 Nicotine dependence, cigarettes, uncomplicated: Secondary | ICD-10-CM | POA: Insufficient documentation

## 2016-05-29 DIAGNOSIS — Z7982 Long term (current) use of aspirin: Secondary | ICD-10-CM | POA: Insufficient documentation

## 2016-05-29 DIAGNOSIS — Z79899 Other long term (current) drug therapy: Secondary | ICD-10-CM | POA: Insufficient documentation

## 2016-05-29 DIAGNOSIS — I252 Old myocardial infarction: Secondary | ICD-10-CM | POA: Insufficient documentation

## 2016-05-29 DIAGNOSIS — R531 Weakness: Secondary | ICD-10-CM

## 2016-05-29 DIAGNOSIS — Z720 Tobacco use: Secondary | ICD-10-CM

## 2016-05-29 DIAGNOSIS — I739 Peripheral vascular disease, unspecified: Secondary | ICD-10-CM

## 2016-05-29 LAB — CBC
HEMATOCRIT: 45.6 % (ref 39.0–52.0)
Hemoglobin: 14.9 g/dL (ref 13.0–17.0)
MCH: 30.3 pg (ref 26.0–34.0)
MCHC: 32.7 g/dL (ref 30.0–36.0)
MCV: 92.9 fL (ref 78.0–100.0)
Platelets: 189 10*3/uL (ref 150–400)
RBC: 4.91 MIL/uL (ref 4.22–5.81)
RDW: 14.1 % (ref 11.5–15.5)
WBC: 7.8 10*3/uL (ref 4.0–10.5)

## 2016-05-29 LAB — BASIC METABOLIC PANEL
Anion gap: 9 (ref 5–15)
BUN: 12 mg/dL (ref 6–20)
CALCIUM: 9.6 mg/dL (ref 8.9–10.3)
CO2: 26 mmol/L (ref 22–32)
CREATININE: 1.64 mg/dL — AB (ref 0.61–1.24)
Chloride: 107 mmol/L (ref 101–111)
GFR calc Af Amer: 48 mL/min — ABNORMAL LOW (ref >60)
GFR, EST NON AFRICAN AMERICAN: 42 mL/min — AB (ref >60)
GLUCOSE: 112 mg/dL — AB (ref 65–99)
Potassium: 4.9 mmol/L (ref 3.5–5.1)
Sodium: 142 mmol/L (ref 135–145)

## 2016-05-29 LAB — I-STAT TROPONIN, ED: TROPONIN I, POC: 0 ng/mL (ref 0.00–0.08)

## 2016-05-29 IMAGING — CT CT HEAD W/O CM
1 series · 15 of 30 positions shown, 19 images · non-contrast
Comparison: 08/19/2014 CT head and MR brain exams

CLINICAL DATA: Headaches, LEFT arm weakness, slurred speech and
drooling, onset of symptoms at 3900 hours today per patient, history
recent stroke, hypertension, hyperlipidemia, coronary artery disease
post MI, CHF, COPD, asthma, smoker

EXAM:
CT HEAD WITHOUT CONTRAST
TECHNIQUE: Contiguous axial images were obtained from the base of the skull
through the vertex without intravenous contrast.

[Series 2: head 5.0 h30s · axial · 0.46mm/px · z∈[-109,+31]mm · 15 of 32 slices shown, 19 images]
[im 2/32  brain]
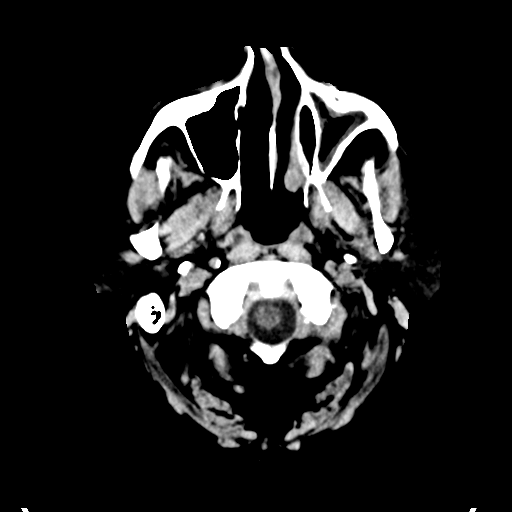
[im 2/32  bone]
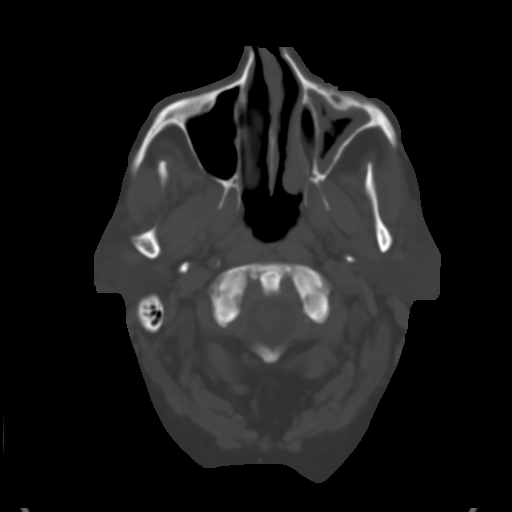
[im 4/32  brain]
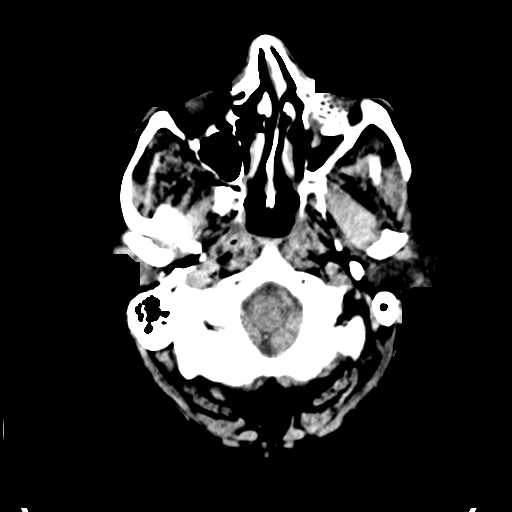
[im 6/32  brain]
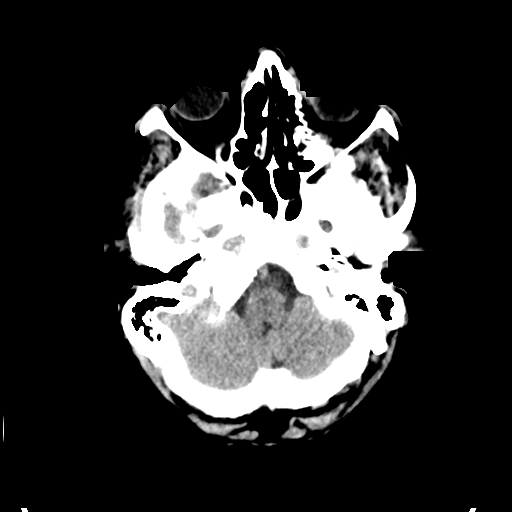
[im 8/32  brain]
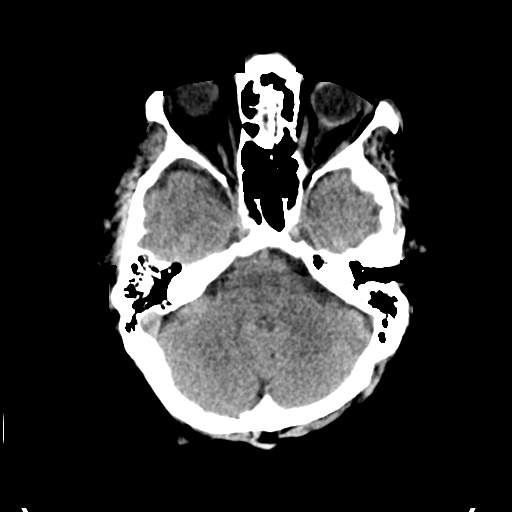
[im 10/32  brain]
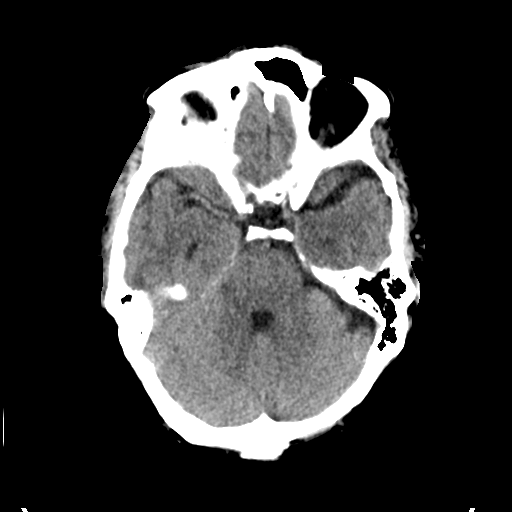
[im 10/32  bone]
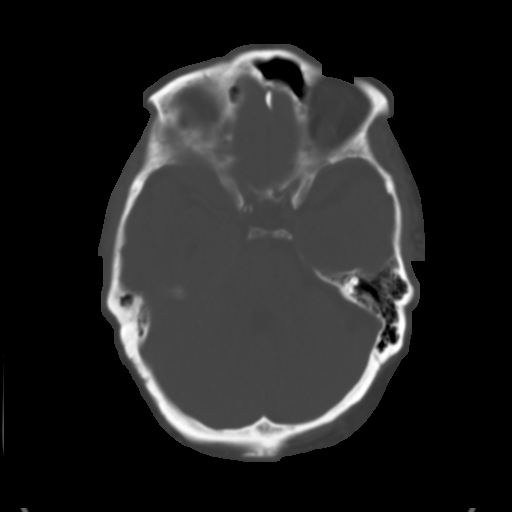
[im 12/32  brain]
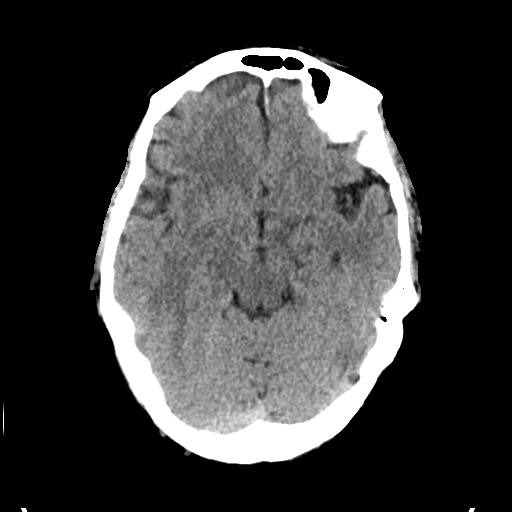
[im 14/32  brain]
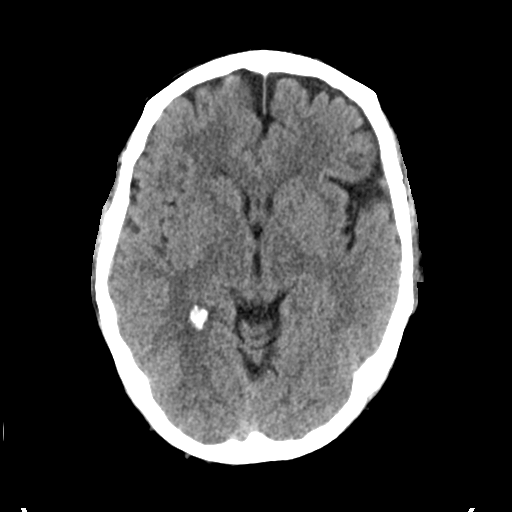
[im 17/32  brain]
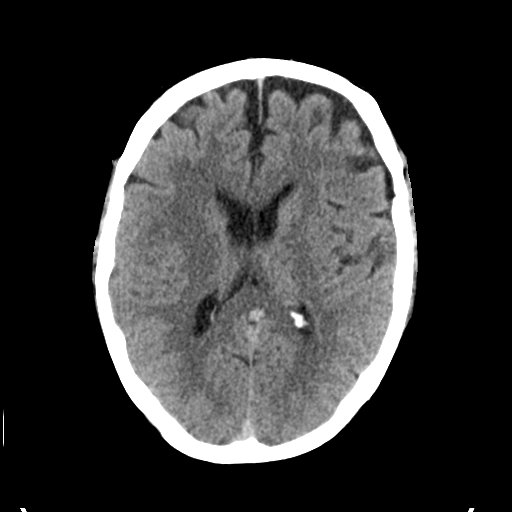
[im 18/32  brain]
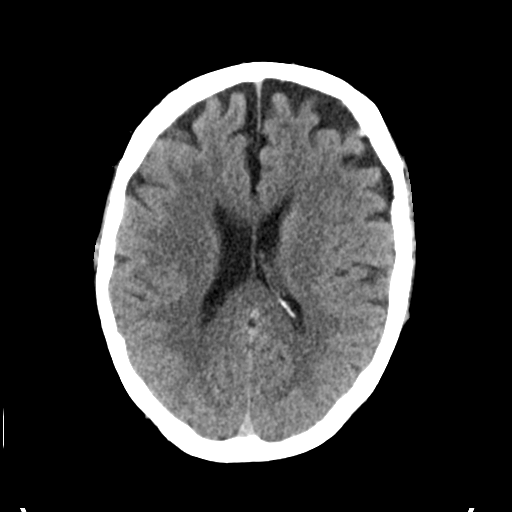
[im 18/32  bone]
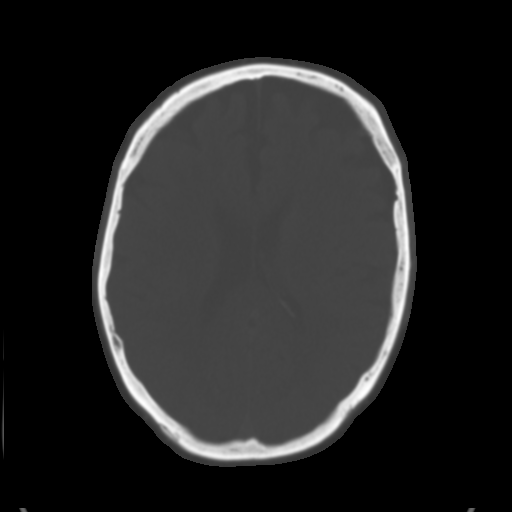
[im 20/32  brain]
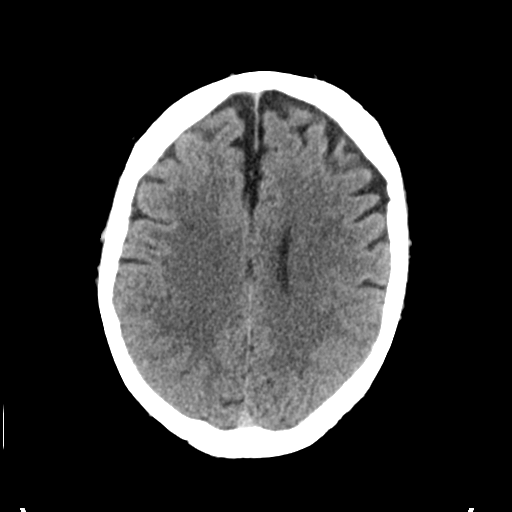
[im 22/32  brain]
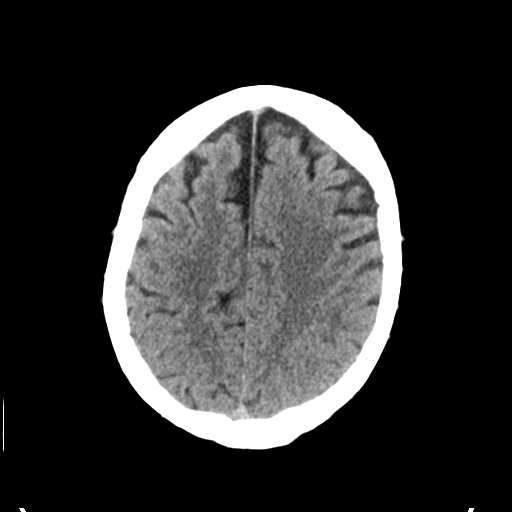
[im 24/32  brain]
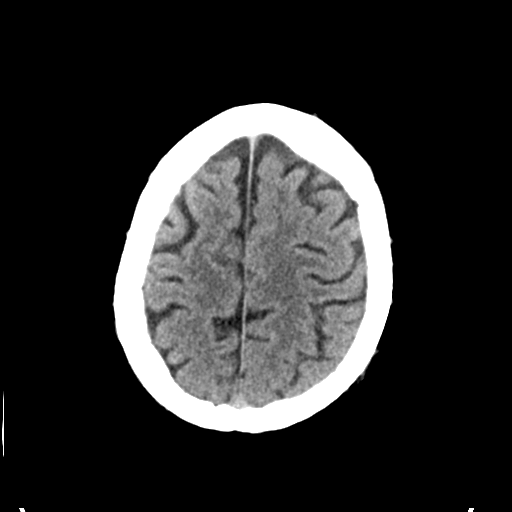
[im 26/32  brain]
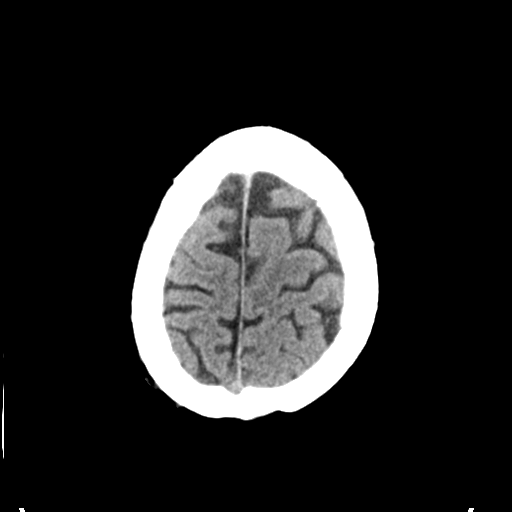
[im 26/32  bone]
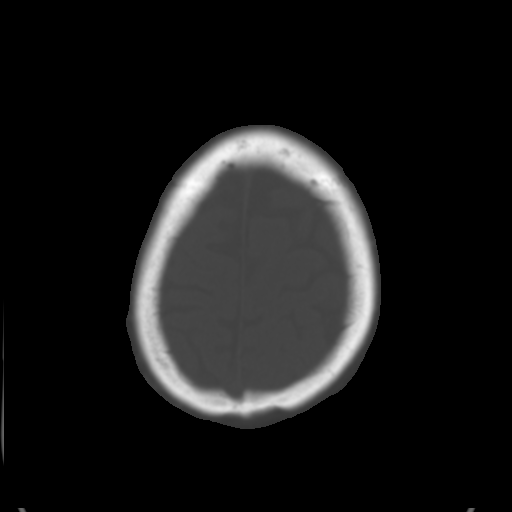
[im 28/32  brain]
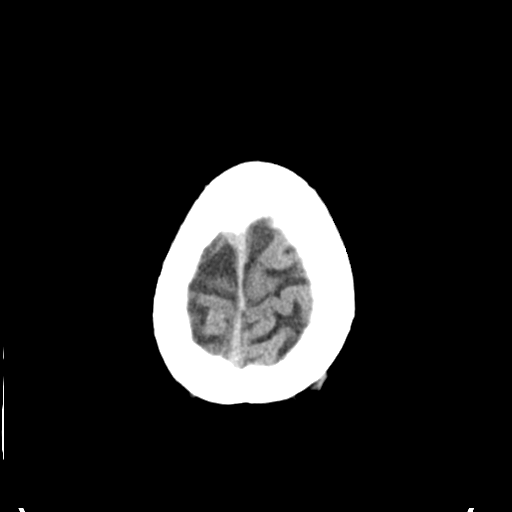
[im 30/32  brain]
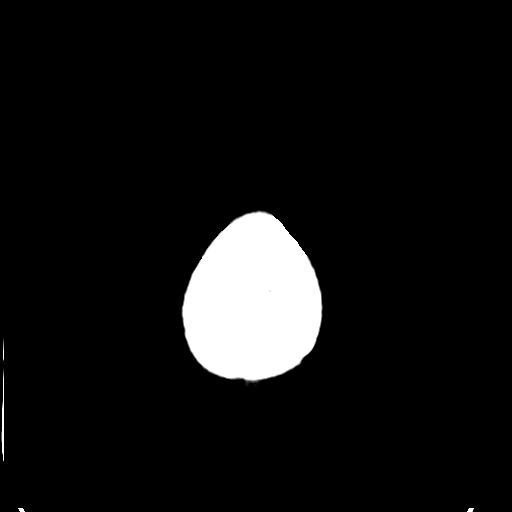

[15 of 30 positions shown; findings below may reference images not displayed]

FINDINGS: Generalized atrophy.

Normal ventricular morphology.

No midline shift or mass effect.

Mild small vessel chronic ischemic changes of deep cerebral white
matter.

No intracranial hemorrhage, mass lesion or evidence acute
infarction.

No extra-axial fluid collections.

Tiny cortical infarct identified at high posterior RIGHT frontal
lobe on recent MR is not definitely visualized by CT.

Mucosal thickening and opacification at LEFT maxillary sinus with
postsurgical changes from LEFT orbital reconstruction unchanged.

No acute osseous findings.
IMPRESSION: Atrophy with mild small vessel chronic ischemic changes of deep
cerebral white matter.

No acute intracranial abnormalities.

## 2016-05-29 MED ORDER — ACETAMINOPHEN 325 MG PO TABS
650.0000 mg | ORAL_TABLET | Freq: Once | ORAL | Status: AC
  Administered 2016-05-29: 650 mg via ORAL
  Filled 2016-05-29: qty 2

## 2016-05-29 NOTE — ED Triage Notes (Signed)
Pt home health nurse reports that he had left sided weakness/numbness/pain that started last night. She also states that his speech is slurred. Pt reports left sided chest pain as well. Pt was sent here for eval by his MD who also reported low BP in the 80's.

## 2016-05-29 NOTE — Telephone Encounter (Addendum)
Pt arrived to [facility] with his caregiver who states his speech is slurred and BP dropping.  Pt sitting in wheelchair, assisted to room for further assistance. Pt alert and oriented x 4. Right side facial drooping, pt has noted to have h/o of stroke.  Grips are unequal, pt unable to grip with the left hand.   VS: 90/55, manual and 80/64 with dinamap.  SpO2: 98%RA, HR: 64.   MD informed of sx's. Recommended pt to ED.

## 2016-05-29 NOTE — Discharge Instructions (Signed)
The testing today was reassuring.  Your blood pressure has improved.  It is probably low because of the Coreg.  Therefore we are advising that he stop the Coreg for now.  It is important to follow-up with your primary care provider in 3 days for a blood pressure check and further recommendations for treatment of your blood pressure.  Stop using all tobacco products.  If you feel dizzy, sit down.  Return here, if needed, for problems.

## 2016-05-29 NOTE — ED Provider Notes (Signed)
[facility] DEPT Provider Note   CSN: [id number] Arrival date & time: 05/29/16  1410     History   Chief Complaint Chief Complaint  Patient presents with  . Weakness    HPI [name] is a 66 y.o. male.  He presents here primarily to be evaluated for low blood pressure.  He was at his PCP office today to be evaluated for low blood pressure, and also told them that he had some numbness in his left face and left arm, and chest pain, both of which started yesterday evening.[name]  His home health aide checked his blood pressure this morning and it was low at 70 over palpable.  He continues to smoke.  He does not use oxygen at home.  He denies fever, chills, nausea, vomiting, focal weakness or difficulty walking.  His home health aide is with him states his voice is normal.  He has an aide during the day, and is alone at night.  He saw his interventional vascular radiologist for a checkup, last week.  There are no other known modifying factors.  HPI  Past Medical History:  Diagnosis Date  . Alcohol abuse    H/O  . Anxiety   . Arthritis   . Asthma   . CAD (coronary artery disease) 05/27/10   Cath: severe single vessell CAD left cx midportion obtuse marginal 2 to 3.  [name] CHF (congestive heart failure) ([place])   . COPD (chronic obstructive pulmonary disease) ([place])   . COPD (chronic obstructive pulmonary disease) ([place])   . Depression   . Diabetes mellitus, type 2 ([place]) 03/13/2015  . GERD (gastroesophageal reflux disease)   . HCAP (healthcare-associated pneumonia) 10/15/2015  . History of DVT of lower extremity   . Hyperlipemia   . Hyperlipidemia   . Hypertension   . Myocardial infarction ([place]) 2000  . Orbital fracture ([place]) 12/2012  . OSA on CPAP   . Peripheral vascular disease, unspecified ([place])    08/20/10 doppler: increase in right ABI post-op. Left ABI stable. S/P bi-fem bypass surgery  . Pneumonia 04/04/2012  . Shortness of breath   . Stroke ([place])   . Tobacco abuse      Patient Active Problem List   Diagnosis Date Noted  . Insomnia 01/29/2016  . Depression 01/29/2016  . Alcohol abuse with intoxication ([place]) 10/16/2015  . Psoriasiform dermatitis 07/12/2015  . Dementia due to another medical condition 07/12/2015  . Urinary incontinence 03/14/2015  . Diabetes mellitus, type 2 ([place]) 03/13/2015  . Hemiparesis affecting left side as late effect of cerebrovascular accident ([place]) 02/18/2015  . Cerebrovascular accident (CVA) due to stenosis of right carotid artery ([place])   . Coronary artery disease involving native coronary artery of native heart without angina pectoris   . Tachypnea   . Thrombocytopenia ([place])   . History of stroke   . Stroke (cerebrum) ([place]) 02/06/2015  . Arterial ischemic stroke, MCA (middle cerebral artery), right, acute ([place])   . DOE (dyspnea on exertion) 11/23/2014  . PAF (paroxysmal atrial fibrillation) ([place]) 11/23/2014  . HLD (hyperlipidemia) 11/01/2014  . Tobacco use disorder 11/01/2014  . Diastolic CHF, acute on chronic (HCC) 11/01/2014  . Cerebral infarction due to stenosis of right carotid artery ([place]) 11/01/2014  . Carotid stenosis 11/01/2014  . Hyperkalemia 10/24/2014  . H/O total knee replacement 10/24/2014  . Essential hypertension   . Polysubstance abuse 08/28/2014  . Cocaine abuse   . Intracranial carotid stenosis   . Headache around the eyes 08/27/2014  .  Acute CVA (cerebrovascular accident) ([place]) 08/27/2014  . CVA (cerebral vascular accident) ([place]) 08/26/2014  . History of DVT (deep vein thrombosis)   . Alcoholism ([place])   . Embolic stroke ([place])   . Cerebral infarction due to unspecified mechanism   . Hypotension   . AKI (acute kidney injury) ([place])   . Stroke ([place])   . CVA (cerebral infarction) 08/01/2014  . Alcohol dependence with withdrawal with complication ([place]) 22/97/9892  . DVT (deep venous thrombosis) ([place]) 02/22/2014  . Lower extremity edema 02/21/2014  . Chronic diastolic congestive heart failure  ([place]) 11/21/2013  . CAD (coronary artery disease) 11/21/2013  . Alcohol abuse 11/21/2013  . GERD (gastroesophageal reflux disease) 08/10/2013  . Gout 08/10/2013  . Tobacco abuse 07/26/2013  . Trichiasis without entropion 04/16/2013  . Dizziness 04/10/2013  . Eyelid lesion 01/24/2013  . Enophthalmos due to trauma 01/19/2013  . Medial orbital wall fracture (HCC) 01/19/2013  . Closed blow-out fracture of floor of orbit ([place]) 01/19/2013  . Binocular vision disorder with diplopia 01/03/2013  . Fracture of inferior orbital wall ([place]) 01/03/2013  . Fracture of orbital floor ([place]) 01/03/2013  . Pain of left eye 01/03/2013  . Peripheral vascular disease ([place]) 07/27/2012  . COPD (chronic obstructive pulmonary disease) ([place]) 04/04/2012  . HTN (hypertension) 04/04/2012    Past Surgical History:  Procedure Laterality Date  . abdoninal ao angio & bifem angio  05/27/10   Patent graft, occluded bil stents with no retrograde flow into the hypogastric arteries. 100% occl left ant. tibial artery, 70% to 80% to 100% stenosis right superficial fem artery above adductor canal. 100 % occl right ant. tibial vessell  . Aortogram w/ PTCA  09/292003  . BACK SURGERY    . CARDIAC CATHETERIZATION  05/27/10   severe CAD left cx  . CHOLECYSTECTOMY    . ESOPHAGOGASTRIC FUNDOPLICATION    . EYE SURGERY    . FEMORAL BYPASS  08/19/10   Right Fem-Pop  . IR RADIOLOGIST EVAL & MGMT  05/27/2016  . JOINT REPLACEMENT    . PERIPHERAL VASCULAR CATHETERIZATION N/A 12/10/2014   Procedure: Abdominal Aortogram;  Surgeon: Angelia Mould, MD;  Location: [facility];  Service: Cardiovascular;  Laterality: N/A;  . RADIOLOGY WITH ANESTHESIA N/A 02/08/2015   Procedure: RADIOLOGY WITH ANESTHESIA;  Surgeon: Luanne Bras, MD;  Location: [facility];  Service: Radiology;  Laterality: N/A;  . TEE WITHOUT CARDIOVERSION N/A 08/29/2014   Procedure: TRANSESOPHAGEAL ECHOCARDIOGRAM (TEE);  Surgeon: Josue Hector, MD;  Location: [facility]  ENDOSCOPY;  Service: Cardiovascular;  Laterality: N/A;  . TOTAL KNEE ARTHROPLASTY         Home Medications    Prior to Admission medications   Medication Sig Start Date End Date Taking? Authorizing Provider  ACCU-CHEK SOFTCLIX LANCETS lancets Use 3 times daily before meals 09/17/15   Arnoldo Morale, MD  acetaminophen-codeine (TYLENOL #3) 300-30 MG tablet Take 1 tablet by mouth every 8 (eight) hours as needed for moderate pain. 04/28/16   Arnoldo Morale, MD  albuterol (PROAIR HFA) 108 (90 Base) MCG/ACT inhaler Inhale 2 puffs into the lungs every 6 (six) hours as needed for wheezing or shortness of breath. 04/28/16   Arnoldo Morale, MD  albuterol (PROVENTIL) (2.5 MG/3ML) 0.083% nebulizer solution Take 3 mLs (2.5 mg total) by nebulization every 6 (six) hours as needed for wheezing or shortness of breath. 04/28/16   Arnoldo Morale, MD  allopurinol (ZYLOPRIM) 300 MG tablet Take 1 tablet (300 mg total) by mouth daily. 04/28/16  Arnoldo Morale, MD  aspirin 325 MG EC tablet Take 1 tablet (325 mg total) by mouth daily. 01/16/15   Arnoldo Morale, MD  atorvastatin (LIPITOR) 40 MG tablet Take 1 tablet (40 mg total) by mouth daily at 6 PM. 04/28/16   Arnoldo Morale, MD  Blood Glucose Monitoring Suppl (ACCU-CHEK AVIVA) device Use 3 times daily before meals. 03/13/15   Arnoldo Morale, MD  clopidogrel (PLAVIX) 75 MG tablet Take 1 tablet (75 mg total) by mouth daily. 04/28/16   Arnoldo Morale, MD  colchicine 0.6 MG tablet Take 2 tabs (1.2 mg) at the onset of a gout attack, may repeat 1 tablet (0.6 mg) in one hour if symptoms persist Patient not taking: Reported on 04/28/2016 10/16/15   Bethany Molt, DO  FLUoxetine (PROZAC) 20 MG tablet Take 1 tablet (20 mg total) by mouth daily. 04/28/16   Arnoldo Morale, MD  furosemide (LASIX) 40 MG tablet Take 1.5 tablets (60 mg total) by mouth 2 (two) times daily. 04/28/16   Arnoldo Morale, MD  gabapentin (NEURONTIN) 300 MG capsule Take 2 capsules (600 mg total) by mouth 3 (three) times daily. 04/28/16    Arnoldo Morale, MD  glucose blood (ACCU-CHEK AVIVA) test strip Use 3 times daily before meals as directed. 01/29/16   Arnoldo Morale, MD  lactulose (CHRONULAC) 10 GM/15ML solution Take 15 mLs (10 g total) by mouth 2 (two) times daily as needed for mild constipation. 04/28/16   Arnoldo Morale, MD  Lancet Devices [facility]) lancets Use 3 times daily before meals 09/27/15   Arnoldo Morale, MD  lisinopril (PRINIVIL,ZESTRIL) 10 MG tablet Take 1 tablet (10 mg total) by mouth daily. 04/28/16   Arnoldo Morale, MD  memantine (NAMENDA) 10 MG tablet Take 1 tablet (10 mg total) by mouth 2 (two) times daily. 04/28/16   Arnoldo Morale, MD  Misc. Devices (ROLLATOR) MISC 1 each by Does not apply route daily. 02/27/15   Arnoldo Morale, MD  pantoprazole (PROTONIX) 40 MG tablet Take 1 tablet (40 mg total) by mouth every morning. 04/28/16   Arnoldo Morale, MD  tiZANidine (ZANAFLEX) 4 MG tablet Take 1 tablet (4 mg total) by mouth every 8 (eight) hours as needed for muscle spasms. 04/28/16   Arnoldo Morale, MD  traZODone (DESYREL) 50 MG tablet Take 1 tablet (50 mg total) by mouth at bedtime as needed for sleep. 04/28/16   Arnoldo Morale, MD  triamcinolone cream (KENALOG) 0.1 % Apply 1 application topically daily as needed (for dermatitis). 04/28/16   Arnoldo Morale, MD    Family History Family History  Problem Relation Age of Onset  . Heart attack Mother   . Heart failure Mother   . Cirrhosis Father   . Heart failure Brother   . Cancer Brother   . Hypertension Neg Hx     UNKNOWN  . Stroke Neg Hx     UNKNOWN    Social History Social History  Substance Use Topics  . Smoking status: Current Every Day Smoker    Packs/day: 1.00    Years: 50.00    Types: Cigars, Cigarettes    Start date: 02/09/1961  . Smokeless tobacco: Former Systems developer    Types: Chew     Comment: 2 ppd, full flavor  . Alcohol use No     Allergies   Darvocet [propoxyphene n-acetaminophen]; Haldol [haloperidol decanoate]; Acetaminophen; Metformin and related; and  Norco [hydrocodone-acetaminophen]   Review of Systems Review of Systems  All other systems reviewed and are negative.    Physical Exam Updated  Vital Signs BP 127/77   Pulse 72   Temp 97.4 F (36.3 C)   Resp 18   SpO2 98%   Physical Exam  Constitutional: He is oriented to person, place, and time. He appears well-developed.  Appears older than stated age.  Obese.  HENT:  Head: Normocephalic and atraumatic.  Right Ear: External ear normal.  Left Ear: External ear normal.  Eyes: Conjunctivae and EOM are normal. Pupils are equal, round, and reactive to light.  Neck: Normal range of motion and phonation normal. Neck supple.  Cardiovascular: Normal rate, regular rhythm and normal heart sounds.   Pulmonary/Chest: Effort normal. He exhibits tenderness (Diffuse mid and left anterior tenderness, mild.  No crepitation.). He exhibits no bony tenderness.  Decreased air movement bilaterally, mild, scattered wheezing.  No increased work of breathing.  Abdominal: Soft. There is no tenderness.  Musculoskeletal: Normal range of motion. He exhibits edema (1+ bilateral lower legs).  Neurological: He is alert and oriented to person, place, and time. No cranial nerve deficit or sensory deficit. He exhibits normal muscle tone. Coordination normal.  Skin: Skin is warm, dry and intact.  Psychiatric: He has a normal mood and affect. His behavior is normal. Judgment and thought content normal.  Nursing note and vitals reviewed.    ED Treatments / Results  Labs (all labs ordered are listed, but only abnormal results are displayed) Labs Reviewed  BASIC METABOLIC PANEL - Abnormal; Notable for the following:       Result Value   Glucose, Bld 112 (*)    Creatinine, Ser 1.64 (*)    GFR calc non Af Amer 42 (*)    GFR calc Af Amer 48 (*)    All other components within normal limits  CBC  I-STAT TROPOININ, ED    EKG  EKG Interpretation  Date/Time:  Friday May 29 2016 14:28:04 EDT Ventricular  Rate:  64 PR Interval:  184 QRS Duration: 86 QT Interval:  460 QTC Calculation: 474 R Axis:   70 Text Interpretation:  Normal sinus rhythm Possible Anterior infarct , age undetermined Abnormal ECG since last tracing no significant change Confirmed by Effie Shy  MD, Eythan Jayne [phone]) on 05/29/2016 4:58:57 PM       Radiology Dg Chest 2 View  Result Date: 05/29/2016 CLINICAL DATA:  Chest pain EXAM: CHEST  2 VIEW COMPARISON:  10/16/2015 chest radiograph. FINDINGS: Surgical clips are seen in the region of the esophagogastric junction. Stable cardiomediastinal silhouette with normal heart size. No pneumothorax. No pleural effusion. Minimal bibasilar scarring versus atelectasis. No pulmonary edema. No acute consolidative airspace disease. IMPRESSION: Minimal bibasilar scarring versus atelectasis. Otherwise no active disease in the chest. Electronically Signed   By: Delbert Phenix M.D.   On: 05/29/2016 15:28   Ct Head Wo Contrast  Result Date: 05/29/2016 CLINICAL DATA:  Sudden left-sided numbness with elevated blood pressure EXAM: CT HEAD WITHOUT CONTRAST TECHNIQUE: Contiguous axial images were obtained from the base of the skull through the vertex without intravenous contrast. COMPARISON:  MRI 02/06/2015, CT brain 02/06/2015 FINDINGS: Brain: No acute territorial infarction, hemorrhage, or mass is visualized. 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 Mild to moderate atrophy. Stable ventricle size. Right greater than left periventricular white matter small vessel ischemic changes. Old appearing right basal ganglia lacune. Vascular: No hyperdense vessels.  Carotid artery calcifications. Skull: No acute skull fracture.  No suspicious bone lesion. Sinuses/Orbits: Mucosal thickening in the ethmoid and maxillary sinuses. Possible old nasal bone deformity. Postsurgical changes of the  floor and medial wall of left orbit an the anterior wall of the left maxillary sinus. Other: None IMPRESSION: 1. No definite CT  evidence for acute intracranial abnormality. 2. Bilateral white matter small vessel ischemic changes. Electronically Signed   By: Donavan Foil M.D.   On: 05/29/2016 18:07    Procedures Procedures (including critical care time)  Medications Ordered in ED Medications  acetaminophen (TYLENOL) tablet 650 mg (650 mg Oral Given 05/29/16 1927)     Initial Impression / Assessment and Plan / ED Course  I have reviewed the triage vital signs and the nursing notes.  Pertinent labs & imaging results that were available during my care of the patient were reviewed by me and considered in my medical decision making (see chart for details).  Clinical Course as of May 30 2346  Fri May 29, 2016  1743 High Creatinine: (!) 1.64 [EW]  1743 Low EGFR (Non-African Amer.): (!) 42 [EW]  1743 High Creatinine: (!) 1.64 [EW]  1743 Low BP: (!) 91/51 [EW]    Clinical Course User Index [EW] Daleen Bo, MD    Medications  acetaminophen (TYLENOL) tablet 650 mg (650 mg Oral Given 05/29/16 1927)    Patient Vitals for the past 24 hrs:  BP Temp Temp src Pulse Resp SpO2  05/29/16 1915 127/77 - - 72 18 98 %  05/29/16 1730 - 97.4 F (36.3 C) - - - -  05/29/16 1638 (!) 101/52 97.4 F (36.3 C) Oral (!) 58 16 98 %  05/29/16 1426 (!) 91/51 97.7 F (36.5 C) Oral 64 (!) 21 100 %    7:40 PM Reevaluation with update and discussion. After initial assessment and treatment, an updated evaluation reveals the patient feels better now.  There is no numbness in his face or left hand.  He complains of a somewhat cold feeling in the left leg, and whitish discoloration.  Left foot does have pallor as compared to the right, and he has normal motion of the left leg and foot, and has normal sensation of the left foot.  Patient has a known blockage in his left popliteal artery.  It is being followed by vascular.  Findings discussed with patient, all questions answered. Aminat Shelburne L    Final Clinical Impressions(s) / ED  Diagnoses   Final diagnoses:  Weakness  Chest pain, unspecified type  Tobacco abuse  Peripheral arterial disease (HCC)    Weakness with hypotension, recently started on Coreg.  Unassociated chest wall pain, and transient numbness left face and left arm.  Doubt CVA, ACS, metabolic instability or impending vascular collapse.  Nursing Notes Reviewed/ Care Coordinated Applicable Imaging Reviewed Interpretation of Laboratory Data incorporated into ED treatment  The patient appears reasonably screened and/or stabilized for discharge and I doubt any other medical condition or other [facility] requiring further screening, evaluation, or treatment in the ED at this time prior to discharge.  Plan: Home Medications-hold Coreg, continue other medications; Home Treatments-rest, stop smoking; return here if the recommended treatment, does not improve the symptoms; Recommended follow up-PCP checkup in 3 days for blood pressure monitoring and possible reinitiation of Coreg versus alteration of chronic medication therapy for hypertension   New Prescriptions Discharge Medication List as of 05/29/2016  7:47 PM       Daleen Bo, MD 05/29/16 2356

## 2016-05-29 NOTE — ED Notes (Signed)
Dinner tray ordered; heart healthy diet 

## 2016-05-29 NOTE — ED Notes (Signed)
Pt ambulated with cane.  No difficulty ambulating.

## 2016-05-30 IMAGING — MR MR HEAD W/O CM
9 of 12 series · 27 of 48 positions shown · IV contrast (Yes)
Comparison: CT head August 26, 2014 at 4646 hours andMRI of the head
August 19, 2014 and MRA head August 02, 2014

CLINICAL DATA: Severe headache beginning this morning, extremity
weakness, change in speech. Assess for stroke. History of
hypertension, hyperlipidemia, orbital fracture.

EXAM:
MRI HEAD WITHOUT CONTRAST
MRA HEAD WITHOUT CONTRAST
TECHNIQUE: Multiplanar, multiecho pulse sequences of the brain and surrounding
structures were obtained without intravenous contrast. Angiographic
images of the head were obtained using MRA technique without
contrast.

[Series 2: FLAIR · sagittal · 5.0mm · 0.47mm/px · 1 of 25 slices shown (1 of 2)]
[im 1/25]
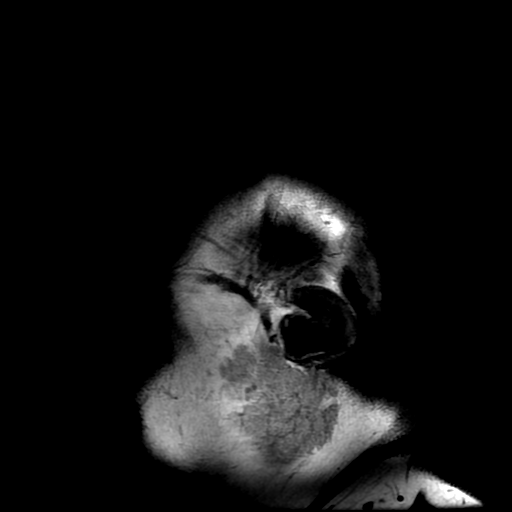

[Series 4: DWI · axial · 3.0mm · 0.94mm/px · z∈[-90,+59]mm · 6 of 102 slices shown (1 of 4)]
[im 1/102]
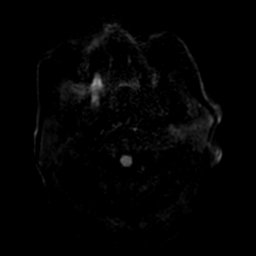
[im 21/102]
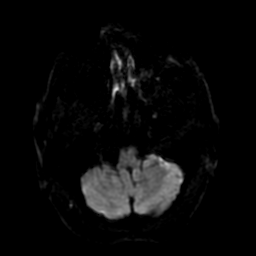
[im 41/102]
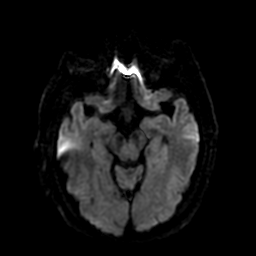
[im 61/102]
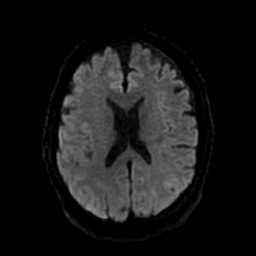
[im 81/102]
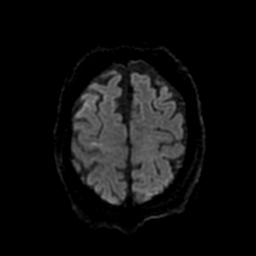
[im 102/102]
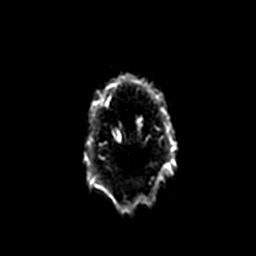

[Series 5: ax (id) 2 · axial · 1.2mm · 0.43mm/px · z∈[-117,-49]mm · 5 of 200 slices shown]
[im 1/200]
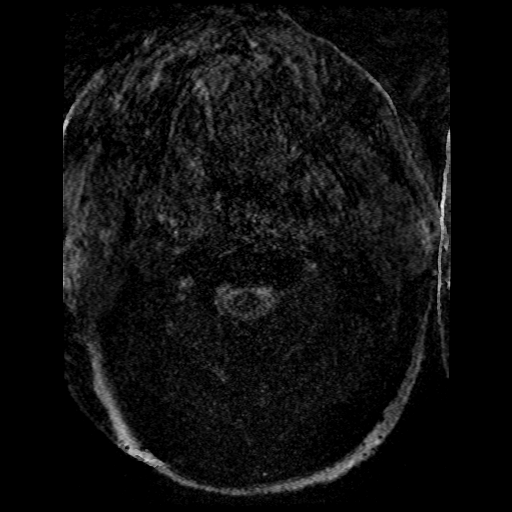
[im 34/200]
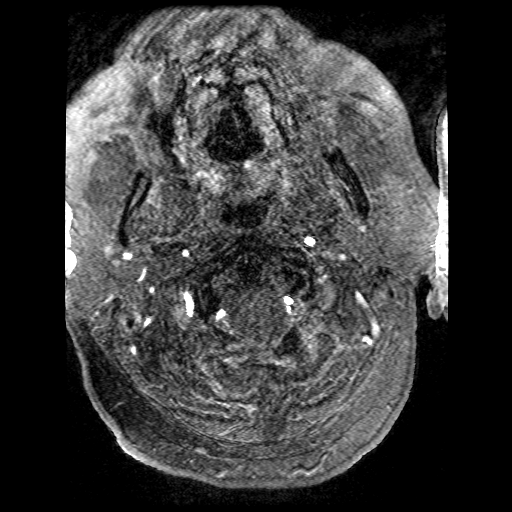
[im 67/200]
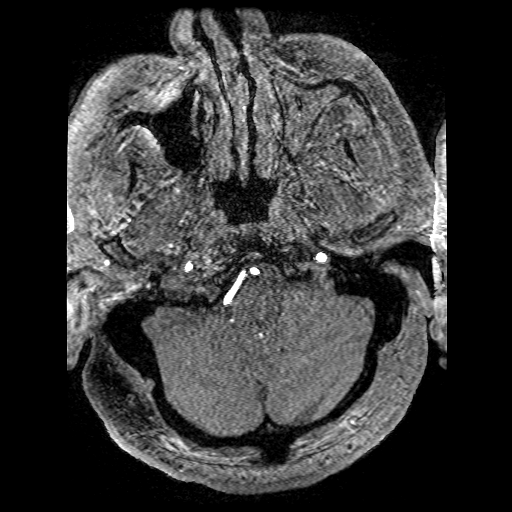
[im 83/200]
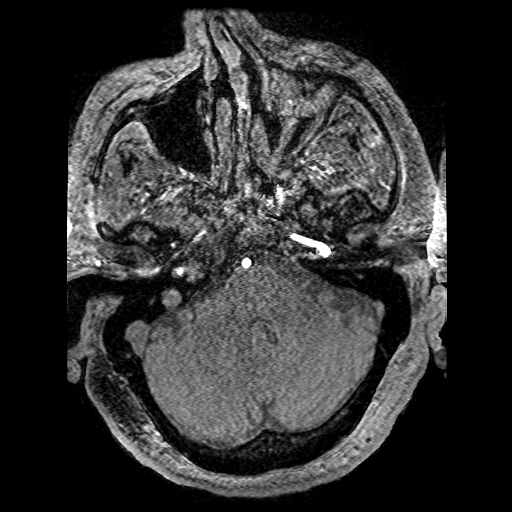
[im 117/200]
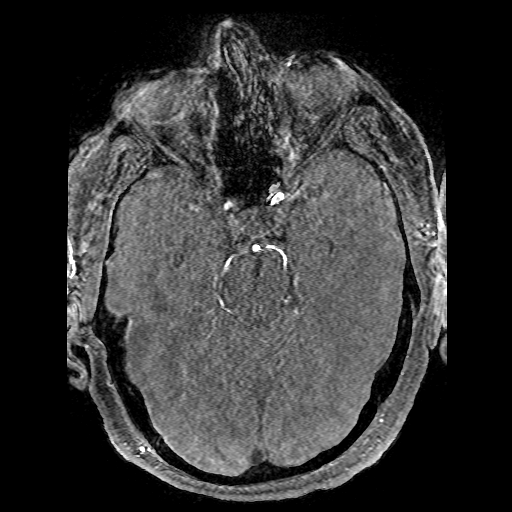

[Series 6: T2 · axial · 5.0mm · 0.47mm/px · z∈[-90,+59]mm · 2 of 26 slices shown (1 of 2)]
[im 1/26]
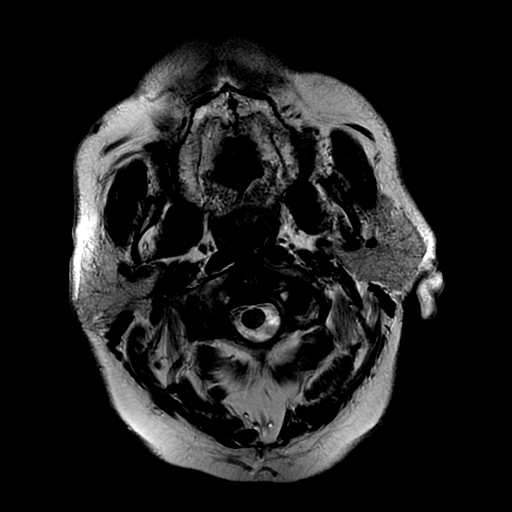
[im 26/26]
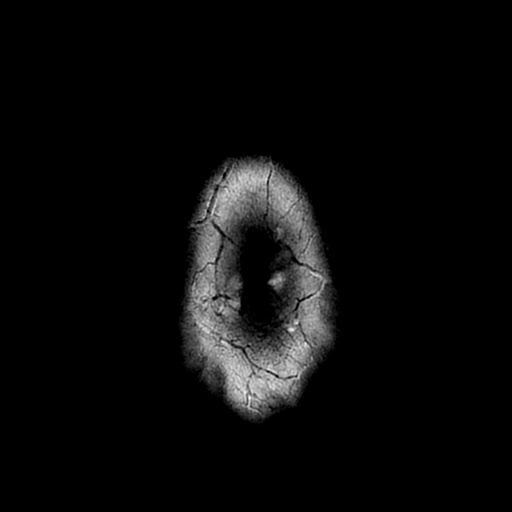

[Series 7: FLAIR · axial · 5.0mm · 0.47mm/px · z∈[-90,+59]mm · 2 of 26 slices shown (2 of 2)]
[im 1/26]
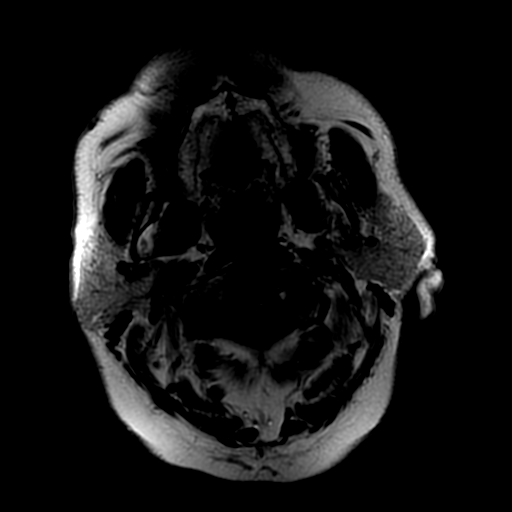
[im 26/26]
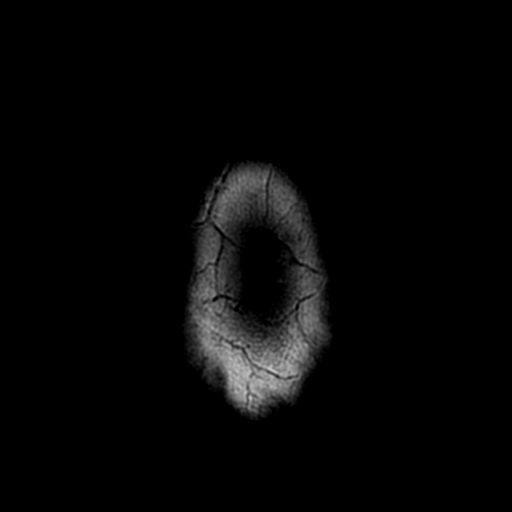

[Series 8: DWI · coronal · 5.0mm · 0.94mm/px · 4 of 66 slices shown (2 of 4)]
[im 1/66]
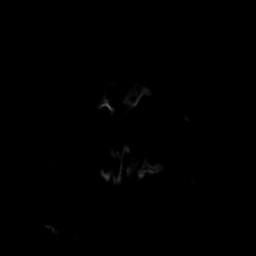
[im 22/66]
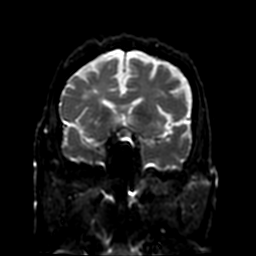
[im 44/66]
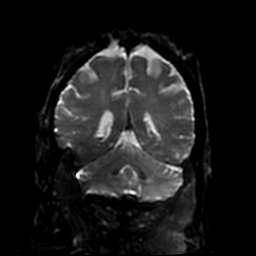
[im 66/66]
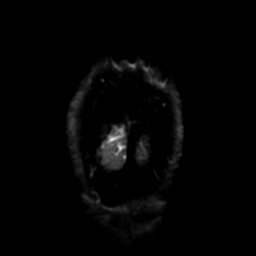

[Series 11: T2 · coronal · 5.0mm · 0.47mm/px · 2 of 28 slices shown (2 of 2)]
[im 1/28]
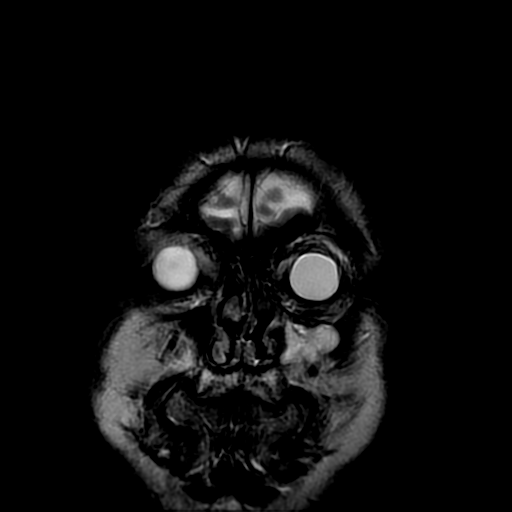
[im 28/28]
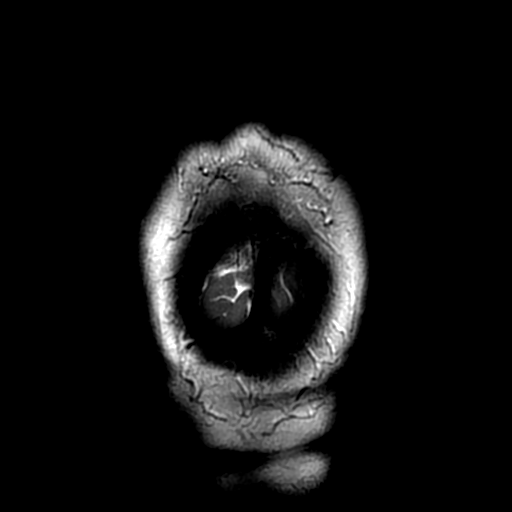

[Series 400: DWI · axial · 3.0mm · 0.94mm/px · z∈[-90,+59]mm · 3 of 51 slices shown (3 of 4)]
[im 1/51]
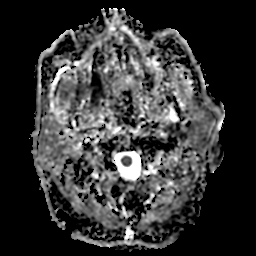
[im 26/51]
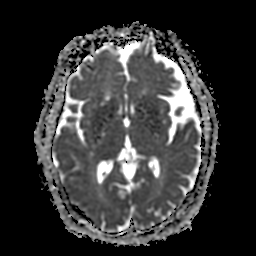
[im 51/51]
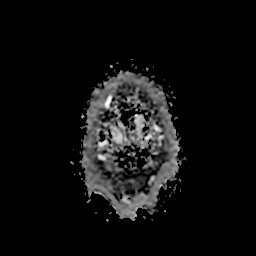

[Series 800: DWI · coronal · 5.0mm · 0.94mm/px · 2 of 33 slices shown (4 of 4)]
[im 1/33]
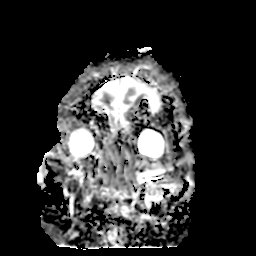
[im 33/33]
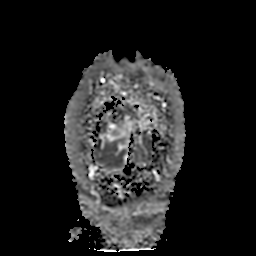

[27 of 48 positions shown; findings below may reference images not displayed]

FINDINGS: MRI HEAD FINDINGS

Subcentimeter focus of reduced diffusion RIGHT corpus callosum is
new from prior imaging, corresponding low ADC value. Faint foci of
reduced diffusion RIGHT frontal lobe consistent with acute on
chronic infarcts, with patchy low ADC values. Stable focus of
susceptibility artifact RIGHT occipital lobe, with apparent
associated developmental venous anomaly compatible with cavernoma.
No susceptibility artifact to suggest hemorrhage. Ventricles and
sulci are normal for patient's age. Patchy to confluent
supratentorial white matter T2 hyperintensities, advanced for age.
No midline shift, mass effect or mass lesions. Old RIGHT caudate
head lacunar infarct.

FLAIR T2 hyperintense signal within the RIGHT occipital subarachnoid
space, similar to prior imaging. Status post RIGHT ocular lens
implant. Circumferential LEFT maxillary sinus mucosal thickening,
mastoid air cells are well aerated. Mildly expanded fluid filled
sella compatible with empty sella. No cerebellar tonsillar ectopia.
No suspicious calvarial bone marrow signal. Patient is edentulous.

MRA HEAD FINDINGS

Moderately motion degraded examination.

Anterior circulation: Somewhat diminutive cervical RIGHT internal
carotid artery, which appears worse than prior examination, a poor
flow related enhancement of the RIGHT petrous, cavernous and supra
clinoid internal carotid artery, with nonvisualized flow related
enhancement of RIGHT supraclinoid internal carotid artery. Moderate
stenosis LEFT para clinoid internal carotid artery. High-grade
stenosis RIGHT A1 segment, at least mild stenosis proximal RIGHT A2
segment. Patent anterior communicating artery with flow related
enhancement of the anterior cerebral arteries. Flow related
enhancement within the middle cerebral arteries.

Posterior circulation: Codominant vertebral arteries with normal
flow related enhancement of vertebral arteries, basilar artery and
main branch vessels. Small RIGHT posterior communicating artery
present. Normal flow related enhancement of posterior cerebral
arteries.

Limited assessment for aneurysm due to motion.
IMPRESSION: MRI HEAD: New subcentimeter acute infarct RIGHT corpus callosum.
Acute on chronic patchy acute ischemia RIGHT frontal lobe.

Chronic changes including moderate to severe chronic small vessel
ischemic disease.

FLAIR T2 hyperintense signal RIGHT occipital sulci and, there is
likely a RIGHT occipital cavernoma in this could be secondary
finding, or can be seen with proteinaceous CSF, supplemental oxygen,
artifact from 3 tesla scanner and LEFT orbital plate and screw
fixation.

MRA HEAD: Moderately motion degraded examination. Overall poor flow
related enhancement of the RIGHT internal carotid artery may reflect
proximal high-grade stenosis. In addition, extremely slow flow
versus occluded RIGHT supraclinoid internal carotid artery. Findings
would be better characterized on CTA of the head and neck as
clinically indicated.

Moderate stenosis LEFT para clinoid internal carotid artery.
High-grade stenosis RIGHT A1 segment. Overall limited assessment due
to motion.

## 2016-06-01 ENCOUNTER — Other Ambulatory Visit: Payer: Self-pay | Admitting: Family Medicine

## 2016-06-01 ENCOUNTER — Other Ambulatory Visit: Payer: Self-pay | Admitting: *Deleted

## 2016-06-01 ENCOUNTER — Telehealth: Payer: Self-pay | Admitting: Family Medicine

## 2016-06-01 DIAGNOSIS — I10 Essential (primary) hypertension: Secondary | ICD-10-CM

## 2016-06-01 MED ORDER — CARVEDILOL 25 MG PO TABS: 12.5000 mg | ORAL_TABLET | Freq: Two times a day (BID) | ORAL | 3 refills | Status: DC

## 2016-06-01 NOTE — Progress Notes (Unsigned)
Pt caregiver [name] aware of new medication dose.

## 2016-06-01 NOTE — Telephone Encounter (Signed)
Pt and caregiver aware of dose change to carvedilol/ coreg. Aware to take Coreg 12.5 mg bid with meals. Pt instructed to take dose tonight. Pt verbalized understanding.

## 2016-06-01 NOTE — Telephone Encounter (Signed)
Resume carvedilol at half of his previous dose bid. Please advise caregiver to ensure the patient is not overdosing on his medications with resultant hypotension.

## 2016-06-01 NOTE — Telephone Encounter (Signed)
Spoke to caregiver, states pt was told to f/u with PCP about whether or not to restart carvedilol. Caregiver states his BP is 180/104 today.

## 2016-06-01 NOTE — Telephone Encounter (Signed)
The caregiver for the PT called to request speak with the nurse, since the PT went to the ED and was informed that he need to stop taking his BP medication, now is high and she need info. Please call her back at PT phone number

## 2016-07-17 ENCOUNTER — Encounter (HOSPITAL_COMMUNITY): Payer: Self-pay | Admitting: *Deleted

## 2016-07-17 ENCOUNTER — Other Ambulatory Visit (HOSPITAL_COMMUNITY): Payer: Self-pay | Admitting: Radiology

## 2016-07-17 ENCOUNTER — Emergency Department (HOSPITAL_COMMUNITY): Payer: Medicare Other

## 2016-07-17 ENCOUNTER — Inpatient Hospital Stay (HOSPITAL_COMMUNITY): Payer: Medicare Other

## 2016-07-17 ENCOUNTER — Inpatient Hospital Stay (HOSPITAL_COMMUNITY)
Admission: EM | Admit: 2016-07-17 | Discharge: 2016-07-21 | DRG: 065 | Disposition: A | Discharge status: Skilled Nursing Facility | Payer: Medicare Other | Attending: Family Medicine | Admitting: Family Medicine

## 2016-07-17 ENCOUNTER — Other Ambulatory Visit (HOSPITAL_COMMUNITY): Payer: Self-pay

## 2016-07-17 DIAGNOSIS — R29709 NIHSS score 9: Secondary | ICD-10-CM | POA: Diagnosis present

## 2016-07-17 DIAGNOSIS — K219 Gastro-esophageal reflux disease without esophagitis: Secondary | ICD-10-CM | POA: Diagnosis present

## 2016-07-17 DIAGNOSIS — R32 Unspecified urinary incontinence: Secondary | ICD-10-CM | POA: Diagnosis present

## 2016-07-17 DIAGNOSIS — E1151 Type 2 diabetes mellitus with diabetic peripheral angiopathy without gangrene: Secondary | ICD-10-CM | POA: Diagnosis present

## 2016-07-17 DIAGNOSIS — I480 Paroxysmal atrial fibrillation: Secondary | ICD-10-CM | POA: Diagnosis present

## 2016-07-17 DIAGNOSIS — I5032 Chronic diastolic (congestive) heart failure: Secondary | ICD-10-CM | POA: Diagnosis present

## 2016-07-17 DIAGNOSIS — E785 Hyperlipidemia, unspecified: Secondary | ICD-10-CM | POA: Diagnosis present

## 2016-07-17 DIAGNOSIS — I110 Hypertensive heart disease with heart failure: Secondary | ICD-10-CM | POA: Diagnosis present

## 2016-07-17 DIAGNOSIS — I639 Cerebral infarction, unspecified: Secondary | ICD-10-CM | POA: Diagnosis present

## 2016-07-17 DIAGNOSIS — F329 Major depressive disorder, single episode, unspecified: Secondary | ICD-10-CM | POA: Diagnosis present

## 2016-07-17 DIAGNOSIS — R262 Difficulty in walking, not elsewhere classified: Secondary | ICD-10-CM | POA: Diagnosis present

## 2016-07-17 DIAGNOSIS — I6330 Cerebral infarction due to thrombosis of unspecified cerebral artery: Secondary | ICD-10-CM

## 2016-07-17 DIAGNOSIS — M25512 Pain in left shoulder: Secondary | ICD-10-CM | POA: Diagnosis present

## 2016-07-17 DIAGNOSIS — Z86718 Personal history of other venous thrombosis and embolism: Secondary | ICD-10-CM

## 2016-07-17 DIAGNOSIS — I472 Ventricular tachycardia: Secondary | ICD-10-CM | POA: Diagnosis not present

## 2016-07-17 DIAGNOSIS — J449 Chronic obstructive pulmonary disease, unspecified: Secondary | ICD-10-CM | POA: Diagnosis present

## 2016-07-17 DIAGNOSIS — Z6831 Body mass index (BMI) 31.0-31.9, adult: Secondary | ICD-10-CM

## 2016-07-17 DIAGNOSIS — Z7151 Drug abuse counseling and surveillance of drug abuser: Secondary | ICD-10-CM

## 2016-07-17 DIAGNOSIS — R471 Dysarthria and anarthria: Secondary | ICD-10-CM | POA: Diagnosis present

## 2016-07-17 DIAGNOSIS — G8194 Hemiplegia, unspecified affecting left nondominant side: Secondary | ICD-10-CM | POA: Diagnosis present

## 2016-07-17 DIAGNOSIS — R269 Unspecified abnormalities of gait and mobility: Secondary | ICD-10-CM

## 2016-07-17 DIAGNOSIS — Z885 Allergy status to narcotic agent status: Secondary | ICD-10-CM

## 2016-07-17 DIAGNOSIS — I361 Nonrheumatic tricuspid (valve) insufficiency: Secondary | ICD-10-CM | POA: Diagnosis not present

## 2016-07-17 DIAGNOSIS — Z8249 Family history of ischemic heart disease and other diseases of the circulatory system: Secondary | ICD-10-CM

## 2016-07-17 DIAGNOSIS — E1142 Type 2 diabetes mellitus with diabetic polyneuropathy: Secondary | ICD-10-CM | POA: Diagnosis present

## 2016-07-17 DIAGNOSIS — Z7902 Long term (current) use of antithrombotics/antiplatelets: Secondary | ICD-10-CM

## 2016-07-17 DIAGNOSIS — I441 Atrioventricular block, second degree: Secondary | ICD-10-CM | POA: Diagnosis not present

## 2016-07-17 DIAGNOSIS — I63411 Cerebral infarction due to embolism of right middle cerebral artery: Secondary | ICD-10-CM | POA: Diagnosis present

## 2016-07-17 DIAGNOSIS — F1021 Alcohol dependence, in remission: Secondary | ICD-10-CM | POA: Diagnosis present

## 2016-07-17 DIAGNOSIS — F0390 Unspecified dementia without behavioral disturbance: Secondary | ICD-10-CM | POA: Diagnosis present

## 2016-07-17 DIAGNOSIS — M109 Gout, unspecified: Secondary | ICD-10-CM | POA: Diagnosis present

## 2016-07-17 DIAGNOSIS — Z716 Tobacco abuse counseling: Secondary | ICD-10-CM

## 2016-07-17 DIAGNOSIS — F17210 Nicotine dependence, cigarettes, uncomplicated: Secondary | ICD-10-CM | POA: Diagnosis present

## 2016-07-17 DIAGNOSIS — I2510 Atherosclerotic heart disease of native coronary artery without angina pectoris: Secondary | ICD-10-CM | POA: Diagnosis present

## 2016-07-17 DIAGNOSIS — Z9119 Patient's noncompliance with other medical treatment and regimen: Secondary | ICD-10-CM

## 2016-07-17 DIAGNOSIS — Z79899 Other long term (current) drug therapy: Secondary | ICD-10-CM

## 2016-07-17 DIAGNOSIS — Z95828 Presence of other vascular implants and grafts: Secondary | ICD-10-CM

## 2016-07-17 DIAGNOSIS — Z96651 Presence of right artificial knee joint: Secondary | ICD-10-CM | POA: Diagnosis present

## 2016-07-17 DIAGNOSIS — F419 Anxiety disorder, unspecified: Secondary | ICD-10-CM | POA: Diagnosis present

## 2016-07-17 DIAGNOSIS — Z9049 Acquired absence of other specified parts of digestive tract: Secondary | ICD-10-CM

## 2016-07-17 DIAGNOSIS — F1410 Cocaine abuse, uncomplicated: Secondary | ICD-10-CM | POA: Diagnosis present

## 2016-07-17 DIAGNOSIS — Z66 Do not resuscitate: Secondary | ICD-10-CM | POA: Diagnosis present

## 2016-07-17 DIAGNOSIS — G4733 Obstructive sleep apnea (adult) (pediatric): Secondary | ICD-10-CM | POA: Diagnosis present

## 2016-07-17 DIAGNOSIS — I6300 Cerebral infarction due to thrombosis of unspecified precerebral artery: Secondary | ICD-10-CM

## 2016-07-17 DIAGNOSIS — Z888 Allergy status to other drugs, medicaments and biological substances status: Secondary | ICD-10-CM

## 2016-07-17 DIAGNOSIS — Z7982 Long term (current) use of aspirin: Secondary | ICD-10-CM

## 2016-07-17 DIAGNOSIS — I252 Old myocardial infarction: Secondary | ICD-10-CM | POA: Diagnosis not present

## 2016-07-17 DIAGNOSIS — E669 Obesity, unspecified: Secondary | ICD-10-CM | POA: Diagnosis present

## 2016-07-17 LAB — URINALYSIS, ROUTINE W REFLEX MICROSCOPIC
BILIRUBIN URINE: NEGATIVE
Glucose, UA: NEGATIVE mg/dL
HGB URINE DIPSTICK: NEGATIVE
KETONES UR: NEGATIVE mg/dL
Leukocytes, UA: NEGATIVE
NITRITE: NEGATIVE
PH: 6 (ref 5.0–8.0)
Protein, ur: NEGATIVE mg/dL
Specific Gravity, Urine: 1.017 (ref 1.005–1.030)

## 2016-07-17 LAB — CBC
HCT: 46.6 % (ref 39.0–52.0)
Hemoglobin: 14.9 g/dL (ref 13.0–17.0)
MCH: 29.9 pg (ref 26.0–34.0)
MCHC: 32 g/dL (ref 30.0–36.0)
MCV: 93.6 fL (ref 78.0–100.0)
PLATELETS: 147 10*3/uL — AB (ref 150–400)
RBC: 4.98 MIL/uL (ref 4.22–5.81)
RDW: 14.1 % (ref 11.5–15.5)
WBC: 6.1 10*3/uL (ref 4.0–10.5)

## 2016-07-17 LAB — DIFFERENTIAL
BASOS PCT: 1 %
Basophils Absolute: 0 10*3/uL (ref 0.0–0.1)
EOS ABS: 0.3 10*3/uL (ref 0.0–0.7)
EOS PCT: 5 %
Lymphocytes Relative: 34 %
Lymphs Abs: 2.1 10*3/uL (ref 0.7–4.0)
MONO ABS: 0.5 10*3/uL (ref 0.1–1.0)
Monocytes Relative: 9 %
Neutro Abs: 3.2 10*3/uL (ref 1.7–7.7)
Neutrophils Relative %: 51 %

## 2016-07-17 LAB — I-STAT CHEM 8, ED
BUN: 13 mg/dL (ref 6–20)
CALCIUM ION: 1.26 mmol/L (ref 1.15–1.40)
CHLORIDE: 107 mmol/L (ref 101–111)
Creatinine, Ser: 1 mg/dL (ref 0.61–1.24)
Glucose, Bld: 86 mg/dL (ref 65–99)
HCT: 44 % (ref 39.0–52.0)
Hemoglobin: 15 g/dL (ref 13.0–17.0)
POTASSIUM: 3.8 mmol/L (ref 3.5–5.1)
SODIUM: 145 mmol/L (ref 135–145)
TCO2: 28 mmol/L (ref 0–100)

## 2016-07-17 LAB — COMPREHENSIVE METABOLIC PANEL
ALT: 12 U/L — ABNORMAL LOW (ref 17–63)
ANION GAP: 8 (ref 5–15)
AST: 16 U/L (ref 15–41)
Albumin: 3.6 g/dL (ref 3.5–5.0)
Alkaline Phosphatase: 58 U/L (ref 38–126)
BUN: 12 mg/dL (ref 6–20)
CALCIUM: 9.7 mg/dL (ref 8.9–10.3)
CHLORIDE: 110 mmol/L (ref 101–111)
CO2: 27 mmol/L (ref 22–32)
Creatinine, Ser: 0.96 mg/dL (ref 0.61–1.24)
GFR calc non Af Amer: >60 mL/min (ref >60)
Glucose, Bld: 87 mg/dL (ref 65–99)
POTASSIUM: 3.9 mmol/L (ref 3.5–5.1)
SODIUM: 145 mmol/L (ref 135–145)
Total Bilirubin: 0.6 mg/dL (ref 0.3–1.2)
Total Protein: 6.4 g/dL — ABNORMAL LOW (ref 6.5–8.1)

## 2016-07-17 LAB — ETHANOL

## 2016-07-17 LAB — PROTIME-INR
INR: 1.01
PROTHROMBIN TIME: 13.3 s (ref 11.4–15.2)

## 2016-07-17 LAB — RAPID URINE DRUG SCREEN, HOSP PERFORMED
AMPHETAMINES: NOT DETECTED
BENZODIAZEPINES: NOT DETECTED
Barbiturates: NOT DETECTED
Cocaine: POSITIVE — AB
Opiates: NOT DETECTED
TETRAHYDROCANNABINOL: NOT DETECTED

## 2016-07-17 LAB — I-STAT TROPONIN, ED: Troponin i, poc: 0 ng/mL (ref 0.00–0.08)

## 2016-07-17 LAB — APTT: aPTT: 27 seconds (ref 24–36)

## 2016-07-17 MED ORDER — ASPIRIN 325 MG PO TABS
325.0000 mg | ORAL_TABLET | Freq: Every day | ORAL | Status: DC
  Administered 2016-07-18 – 2016-07-19 (×2): 325 mg via ORAL
  Filled 2016-07-17 (×2): qty 1

## 2016-07-17 MED ORDER — ASPIRIN 300 MG RE SUPP
300.0000 mg | Freq: Every day | RECTAL | Status: DC
  Administered 2016-07-17: 300 mg via RECTAL
  Filled 2016-07-17: qty 1

## 2016-07-17 MED ORDER — ALBUTEROL SULFATE (2.5 MG/3ML) 0.083% IN NEBU: 2.5000 mg | INHALATION_SOLUTION | Freq: Four times a day (QID) | RESPIRATORY_TRACT | Status: DC | PRN

## 2016-07-17 MED ORDER — STROKE: EARLY STAGES OF RECOVERY BOOK
Freq: Once | Status: DC
  Filled 2016-07-17: qty 1

## 2016-07-17 MED ORDER — INSULIN ASPART 100 UNIT/ML ~~LOC~~ SOLN: 0.0000 [IU] | Freq: Every day | SUBCUTANEOUS | Status: DC

## 2016-07-17 MED ORDER — INSULIN ASPART 100 UNIT/ML ~~LOC~~ SOLN
0.0000 [IU] | Freq: Three times a day (TID) | SUBCUTANEOUS | Status: DC
  Administered 2016-07-18: 2 [IU] via SUBCUTANEOUS
  Administered 2016-07-21: 1 [IU] via SUBCUTANEOUS

## 2016-07-17 MED ORDER — NICOTINE 21 MG/24HR TD PT24
21.0000 mg | MEDICATED_PATCH | Freq: Every day | TRANSDERMAL | Status: DC
  Administered 2016-07-18 – 2016-07-21 (×4): 21 mg via TRANSDERMAL
  Filled 2016-07-17 (×4): qty 1

## 2016-07-17 MED ORDER — SODIUM CHLORIDE 0.9 % IV SOLN
INTRAVENOUS | Status: AC
  Administered 2016-07-18: 01:00:00 via INTRAVENOUS

## 2016-07-17 NOTE — ED Notes (Signed)
Admitting at the bedside.  

## 2016-07-17 NOTE — H&P (Signed)
[facility] Admission History and Physical Service Pager: [phone]  Patient name: [name] Medical record number: [id number] Date of birth: November 20, 1950 Age: 66 y.o. Gender: male  Primary Care Provider: Arnoldo Morale, MD Consultants: Neurology  Code Status: DNR (discussed on admission)  Chief Complaint: Left-sided weakness  Assessment and Plan: [name] is a 66 y.o. male presenting with left hemiparesis and slurred speech . PMH is significant for ?PAF (no anticoag), HTN, CAD, dCHF, substance use, history of DVT, gout, DM, carotid stenosis s/p stents, COPD, depression and dementia  Left hemiparesis/slurred speech: multiple episodes of this in the past. Patient has history of occluded cervical and intracranial R ICA s/p stent. He is on Plavix, aspirin and atorvastatin. Reports good compliance with this. Now presents with left-sided weakness (arm>leg) and intermittent slurred speech for 6 days. Exam significant for 2/5 strength as in left upper extremity & 4+/5 in left lower extremity. Slightly diminished light sensation in his left upper extremity. He is slightly dysarthric. Also with brisk reflex in his left patella. Otherwise, within normal limits. CT head with acute/subacute posterior right frontal lobe infarction. Patient was evaluated in ED by neurology who recommended admission for further workup.  -Admit to MedSurg with telemetry. Attending Dr. Andria Frames -Appreciate neurology recommendations -MRI/MRA  -Carotid doppler -Echo -Risk stratification labs (lipid panel, A1c and TSH) -UDS, Ethanol level  -Permissive hypertension  -Tight CBG control  -High dose aspirin -Neurovascular check q2hrs -Cardiac monitor -Atorvastatin after bedside swallow -PT/OT/SLP -NPO pending SLP eval -NS (No dextrose containing fluid) -Fall precaution -SCD  PAF(no anticoag): On his problem list but no detailed in full  on this. He is in sinus rhythm now. [place] Vasc score 7. Not on any anticoagulation except aspirin.  -Need to be readdressed in the morning  HTN: Hypertensive to 170s/80s in ED. He is on carvedilol, Lasix, hydralazine & lisinopril. Last dose on his medications was yesterday. -Permissive hypertension per neurology recommendations  CAD: Had LHC on 05/27/2010 that showed severe CAD and left circumflex artery. I could not find the formal reading of this in his chart. Patient without acute chest pain or dyspnea at this time. Initial EKG and troponin are negative. He is on Coreg, aspirin, Plavix and atorvastatin at home -We will hold Coreg for permissive hypertension -We'll continue aspirin -We will resume Plavix and atorvastatin after SLP  dCHF: Echo on 10/16/2015 with EF of 55-60%, akinesis of the basal inferior myocardium, G1DD. He has no cardiopulmonary symptoms. Exam with 1+ pitting edema bilaterally. CXR without evidence of acute cardiopulmonary disease. He is on Coreg, lisinopril and Lasix at home -Echocardiogram as above -Hold home meds for permissive hypertension  Urinary incontinence: he denies dysuria or other urinary symptoms. Not sure if this is related to his stroke. He has no bowel issues.  -We will check urinalysis -We will monitor urine output  Substance use: Admits using cocaine yesterday -UDS  Tobacco use disorder: Smokes about a pack a day -Nicotine patch ordered  DM: Last A1c 5.7 on 04/28/2016. Seems to be taken off medications.  -SSI-thin -CBG before meals & HS -We will resume his home gabapentin when able  COPD/OSA: on albuterol at home. He continues to smoke about 5-6 cigarettes a day. He has no respiratory symptoms. Doesn't use CPAP at home.  -We will continue albuterol as needed  Depression/?dementia: Stable. On Prozac and Namenda at home. -We'll resume his home Prozac when able -We'll hold Namenda  Gout: Stable -Resume, allopurinol when able  FEN/GI:  -  NPO  pending SLP  Prophylaxis:  -SCD  Disposition: Admit to MedSurg, you'll need for evaluation and management of stroke  History of Present Illness:  [name] is a 66 y.o. male presenting with left arm and leg weakness and intermittent slurred speech. Patient reports weakness in left arm and left leg for the last 6 days. He reports the weakness is more in his left arm than his left leg. He also reports intermittent slurred speech. He denies numbness or tingling. He denies headache or vision changes. He states did not seek care hoping his symptoms would resolve. He presented to ED today because his personal care told him to do so.  He also reports some urinary accidents. He denies dysuria, fever, chills or abdominal pain. He denies bowel incontinence. Denies chest pain, shortness of breasts, nausea, abdominal pain, diarrhea or recent illness.  Last time he took his medications was yesterday.  Admits smoking about a pack a day. He is interested in nicotine patch. Denies drinking alcohol. His last drink was about a month ago. Admits to smoking cocaine as late as yesterday.   ED course: CBC and CMP basically normal. Alcohol level less than 5. Initial troponin and EKG negative. CXR no acute finding. CT head with acute/subacute posterior right frontal lobe infarct. Neurology was consulted and recommended admission and further evaluation.   Review Of Systems: Per HPI with the following additions:  Review of Systems  Constitutional: Negative for chills, fever and malaise/fatigue.  HENT: Negative for sore throat.   Eyes: Negative for blurred vision, double vision and pain.  Respiratory: Negative for cough and shortness of breath.   Cardiovascular: Negative for chest pain, orthopnea and leg swelling.  Gastrointestinal: Negative for abdominal pain and heartburn.  Genitourinary: Negative for dysuria, hematuria and urgency.  Musculoskeletal: Positive for joint pain and myalgias.  Skin: Negative  for rash.  Neurological: Positive for speech change and focal weakness. Negative for dizziness, weakness and headaches.  Psychiatric/Behavioral: Negative for suicidal ideas.    Patient Active Problem List   Diagnosis Date Noted  . Stroke-like symptoms 07/17/2016  . Insomnia 01/29/2016  . Depression 01/29/2016  . Alcohol abuse with intoxication ([place]) 10/16/2015  . Psoriasiform dermatitis 07/12/2015  . Dementia due to another medical condition 07/12/2015  . Urinary incontinence 03/14/2015  . Diabetes mellitus, type 2 ([place]) 03/13/2015  . Hemiparesis affecting left side as late effect of cerebrovascular accident ([place]) 02/18/2015  . Cerebrovascular accident (CVA) due to stenosis of right carotid artery ([place])   . Coronary artery disease involving native coronary artery of native heart without angina pectoris   . Tachypnea   . Thrombocytopenia ([place])   . History of stroke   . Stroke (cerebrum) ([place]) 02/06/2015  . Arterial ischemic stroke, MCA (middle cerebral artery), right, acute ([place])   . DOE (dyspnea on exertion) 11/23/2014  . PAF (paroxysmal atrial fibrillation) ([place]) 11/23/2014  . HLD (hyperlipidemia) 11/01/2014  . Tobacco use disorder 11/01/2014  . Diastolic CHF, acute on chronic (HCC) 11/01/2014  . Cerebral infarction due to stenosis of right carotid artery ([place]) 11/01/2014  . Carotid stenosis 11/01/2014  . Hyperkalemia 10/24/2014  . H/O total knee replacement 10/24/2014  . Essential hypertension   . Polysubstance abuse 08/28/2014  . Cocaine abuse   . Intracranial carotid stenosis   . Headache around the eyes 08/27/2014  . Acute CVA (cerebrovascular accident) ([place]) 08/27/2014  . CVA (cerebral vascular accident) ([place]) 08/26/2014  . History of DVT (deep vein thrombosis)   . Alcoholism (  [place])   . Embolic stroke ([place])   . Cerebral infarction due to unspecified mechanism   . Hypotension   . AKI (acute kidney injury) ([place])   . Stroke ([place])   . CVA (cerebral infarction)  08/01/2014  . Alcohol dependence with withdrawal with complication ([place]) 40/09/6759  . DVT (deep venous thrombosis) ([place]) 02/22/2014  . Lower extremity edema 02/21/2014  . Chronic diastolic congestive heart failure ([place]) 11/21/2013  . CAD (coronary artery disease) 11/21/2013  . Alcohol abuse 11/21/2013  . GERD (gastroesophageal reflux disease) 08/10/2013  . Gout 08/10/2013  . Tobacco abuse 07/26/2013  . Trichiasis without entropion 04/16/2013  . Dizziness 04/10/2013  . Eyelid lesion 01/24/2013  . Enophthalmos due to trauma 01/19/2013  . Medial orbital wall fracture (HCC) 01/19/2013  . Closed blow-out fracture of floor of orbit ([place]) 01/19/2013  . Binocular vision disorder with diplopia 01/03/2013  . Fracture of inferior orbital wall ([place]) 01/03/2013  . Fracture of orbital floor ([place]) 01/03/2013  . Pain of left eye 01/03/2013  . Peripheral vascular disease ([place]) 07/27/2012  . COPD (chronic obstructive pulmonary disease) ([place]) 04/04/2012  . HTN (hypertension) 04/04/2012    Past Medical History: Past Medical History:  Diagnosis Date  . Alcohol abuse    H/O  . Anxiety   . Arthritis   . Asthma   . CAD (coronary artery disease) 05/27/10   Cath: severe single vessell CAD left cx midportion obtuse marginal 2 to 3.  [name] CHF (congestive heart failure) ([place])   . COPD (chronic obstructive pulmonary disease) ([place])   . COPD (chronic obstructive pulmonary disease) ([place])   . Depression   . Diabetes mellitus, type 2 ([place]) 03/13/2015  . GERD (gastroesophageal reflux disease)   . HCAP (healthcare-associated pneumonia) 10/15/2015  . History of DVT of lower extremity   . Hyperlipemia   . Hyperlipidemia   . Hypertension   . Myocardial infarction ([place]) 2000  . Orbital fracture ([place]) 12/2012  . OSA on CPAP   . Peripheral vascular disease, unspecified ([place])    08/20/10 doppler: increase in right ABI post-op. Left ABI stable. S/P bi-fem bypass surgery  . Pneumonia 04/04/2012  . Shortness of breath    . Stroke ([place])   . Tobacco abuse     Past Surgical History: Past Surgical History:  Procedure Laterality Date  . abdoninal ao angio & bifem angio  05/27/10   Patent graft, occluded bil stents with no retrograde flow into the hypogastric arteries. 100% occl left ant. tibial artery, 70% to 80% to 100% stenosis right superficial fem artery above adductor canal. 100 % occl right ant. tibial vessell  . Aortogram w/ PTCA  09/292003  . BACK SURGERY    . CARDIAC CATHETERIZATION  05/27/10   severe CAD left cx  . CHOLECYSTECTOMY    . ESOPHAGOGASTRIC FUNDOPLICATION    . EYE SURGERY    . FEMORAL BYPASS  08/19/10   Right Fem-Pop  . IR RADIOLOGIST EVAL & MGMT  05/27/2016  . JOINT REPLACEMENT    . PERIPHERAL VASCULAR CATHETERIZATION N/A 12/10/2014   Procedure: Abdominal Aortogram;  Surgeon: Angelia Mould, MD;  Location: [facility];  Service: Cardiovascular;  Laterality: N/A;  . RADIOLOGY WITH ANESTHESIA N/A 02/08/2015   Procedure: RADIOLOGY WITH ANESTHESIA;  Surgeon: Luanne Bras, MD;  Location: [facility];  Service: Radiology;  Laterality: N/A;  . TEE WITHOUT CARDIOVERSION N/A 08/29/2014   Procedure: TRANSESOPHAGEAL ECHOCARDIOGRAM (TEE);  Surgeon: Josue Hector, MD;  Location: [facility];  Service: Cardiovascular;  Laterality: N/A;  . TOTAL KNEE ARTHROPLASTY      Social History: Social History  Substance Use Topics  . Smoking status: Current Every Day Smoker    Packs/day: 1.00    Years: 50.00    Types: Cigars, Cigarettes    Start date: 02/09/1961  . Smokeless tobacco: Former Systems developer    Types: Chew     Comment: 2 ppd, full flavor  . Alcohol use No   Additional social history: none  Please also refer to relevant sections of EMR.  Family History: Family History  Problem Relation Age of Onset  . Heart attack Mother   . Heart failure Mother   . Cirrhosis Father   . Heart failure Brother   . Cancer Brother   . Hypertension Neg Hx        UNKNOWN  . Stroke Neg Hx         UNKNOWN   (If not completed, MUST add something in)  Allergies and Medications: Allergies  Allergen Reactions  . Darvocet [Propoxyphene N-Acetaminophen] Hives  . Haldol [Haloperidol Decanoate] Hives  . Acetaminophen Nausea Only    Upset stomach, tolerates Hydrocodone/APAP if taken with food  . Metformin And Related Nausea And Vomiting  . Norco [Hydrocodone-Acetaminophen] Nausea And Vomiting    Tolerates if taken with food   No current facility-administered medications on file prior to encounter.    Current Outpatient Prescriptions on File Prior to Encounter  Medication Sig Dispense Refill  . ACCU-CHEK SOFTCLIX LANCETS lancets Use 3 times daily before meals 100 each 5  . albuterol (PROAIR HFA) 108 (90 Base) MCG/ACT inhaler Inhale 2 puffs into the lungs every 6 (six) hours as needed for wheezing or shortness of breath. 8.5 g 3  . albuterol (PROVENTIL) (2.5 MG/3ML) 0.083% nebulizer solution Take 3 mLs (2.5 mg total) by nebulization every 6 (six) hours as needed for wheezing or shortness of breath. 75 mL 3  . allopurinol (ZYLOPRIM) 300 MG tablet Take 1 tablet (300 mg total) by mouth daily. 30 tablet 3  . aspirin 325 MG EC tablet Take 1 tablet (325 mg total) by mouth daily. 30 tablet 0  . atorvastatin (LIPITOR) 40 MG tablet Take 1 tablet (40 mg total) by mouth daily at 6 PM. 30 tablet 5  . Blood Glucose Monitoring Suppl (ACCU-CHEK AVIVA) device Use 3 times daily before meals. 1 each 0  . carvedilol (COREG) 25 MG tablet Take 0.5 tablets (12.5 mg total) by mouth 2 (two) times daily with a meal. 60 tablet 3  . clopidogrel (PLAVIX) 75 MG tablet Take 1 tablet (75 mg total) by mouth daily. 30 tablet 3  . colchicine 0.6 MG tablet Take 2 tabs (1.2 mg) at the onset of a gout attack, may repeat 1 tablet (0.6 mg) in one hour if symptoms persist 30 tablet 2  . FLUoxetine (PROZAC) 20 MG tablet Take 1 tablet (20 mg total) by mouth daily. 30 tablet 3  . furosemide (LASIX) 40 MG tablet Take 1.5 tablets (60  mg total) by mouth 2 (two) times daily. 90 tablet 3  . gabapentin (NEURONTIN) 300 MG capsule Take 2 capsules (600 mg total) by mouth 3 (three) times daily. 180 capsule 3  . glucose blood (ACCU-CHEK AVIVA) test strip Use 3 times daily before meals as directed. 100 each 12  . lactulose (CHRONULAC) 10 GM/15ML solution Take 15 mLs (10 g total) by mouth 2 (two) times daily as needed for mild constipation. 946 mL 2  . Lancet Devices (ACCU-CHEK [place])  lancets Use 3 times daily before meals 1 each 5  . lisinopril (PRINIVIL,ZESTRIL) 10 MG tablet Take 1 tablet (10 mg total) by mouth daily. 90 tablet 3  . memantine (NAMENDA) 10 MG tablet Take 1 tablet (10 mg total) by mouth 2 (two) times daily. 60 tablet 3  . Misc. Devices (ROLLATOR) MISC 1 each by Does not apply route daily. 1 each 0  . pantoprazole (PROTONIX) 40 MG tablet Take 1 tablet (40 mg total) by mouth every morning. 30 tablet 3  . tiZANidine (ZANAFLEX) 4 MG tablet Take 1 tablet (4 mg total) by mouth every 8 (eight) hours as needed for muscle spasms. 90 tablet 3  . traZODone (DESYREL) 50 MG tablet Take 1 tablet (50 mg total) by mouth at bedtime as needed for sleep. 30 tablet 3  . triamcinolone cream (KENALOG) 0.1 % Apply 1 application topically daily as needed (for dermatitis). 30 g 0    Objective: BP (!) 171/80   Pulse 79   Resp (!) 25   Wt 228 lb 13.4 oz (103.8 kg)   SpO2 96%   BMI 31.04 kg/m  Exam: GEN: appears well, no apparent distress. Head: normocephalic and atraumatic  Eyes: conjunctiva without injection, sclera anicteric Oropharynx: mmm without erythema or exudation CVS: RRR, nl s1 & s2, no murmurs, 1+ edema bilaterally RESP: speaks in full sentence, no IWOB, good air movement bilaterally, CTAB GI: BS present & normal, soft, NTND GU: no suprapubic or CVA tenderness MSK: no notable swelling, mild tenderness to palpation over his left shoulder anteriorly SKIN: no apparent skin lesion NEURO: Awake, alert and oriented 4,  cranial nerve was intact except for CN XI deficit on the left, motor 2/5 in LUE, 4+/5 in LLE, mildly diminished light sensation in the left upper extremities, patellar reflex 2+ on the left & 1+ on the right (history of right knee replacement), finger-to-nose intact on the right, couldn't perform on the left due to weakness.  PSYCH: euthymic mood with congruent affect   Labs and Imaging: CBC BMET   Recent Labs Lab 07/17/16 1655 07/17/16 1710  WBC 6.1  --   HGB 14.9 15.0  HCT 46.6 44.0  PLT 147*  --     Recent Labs Lab 07/17/16 1655 07/17/16 1710  NA 145 145  K 3.9 3.8  CL 110 107  CO2 27  --   BUN 12 13  CREATININE 0.96 1.00  GLUCOSE 87 86  CALCIUM 9.7  --      Dg Chest 2 View  Result Date: 07/17/2016 CLINICAL DATA:  Chest pain.  Recent stroke. EXAM: CHEST  2 VIEW COMPARISON:  05/29/2016 and prior radiographs FINDINGS: Cardiomegaly again noted. There is no evidence of focal airspace disease, pulmonary edema, suspicious pulmonary nodule/mass, pleural effusion, or pneumothorax. No acute bony abnormalities are identified. IMPRESSION: Cardiomegaly without evidence of acute cardiopulmonary disease. Electronically Signed   By: Margarette Canada M.D.   On: 07/17/2016 18:36   Ct Head Code Stroke W/o Cm  Result Date: 07/17/2016 CLINICAL DATA:  Code stroke. Right-sided facial droop beginning 1 hour ago. Left-sided weakness beginning 6 days ago. EXAM: CT HEAD WITHOUT CONTRAST TECHNIQUE: Contiguous axial images were obtained from the base of the skull through the vertex without intravenous contrast. COMPARISON:  CT head without contrast 05/29/2016. FINDINGS: Brain: An acute/subacute posterior right frontal lobe nonhemorrhagic infarct is more prominent than on the prior exam. No acute or subacute left-sided infarct is present. The basal ganglia are intact bilaterally. The insular ribbon is  normal. No acute hemorrhage or mass lesion is present. The ventricles are of normal size. No significant  extra-axial fluid collection is present. Vascular: A cavernous right internal carotid artery stent is in place. There is no hyperdense vessel. Atherosclerotic changes are noted bilaterally. Skull: The calvarium is intact. No focal lytic or blastic lesions are present. Sinuses/Orbits: The left maxillary sinus roof and orbital floor is status post ORIF. Left maxillary sinus is chronically shrunken. The remaining paranasal sinuses and the mastoid air cells are clear bilaterally. Rightward nasal septal deviation is present. Bilateral lens replacements are present. The globes and orbits are within normal limits otherwise. ASPECTS [facility] Stroke Program Early CT Score) - Ganglionic level infarction (caudate, lentiform nuclei, internal capsule, insula, M1-M3 cortex): 7/7 - Supraganglionic infarction (M4-M6 cortex): 2/3 Total score (0-10 with 10 being normal): 9/10 IMPRESSION: 1. Posterior right frontal lobe acute/subacute infarct is more prominent than on the study 2 months ago. This may reflect evolution of the previous infarct or a a progressive new infarct. 2. No other acute infarct. 3. ASPECTS is 9/10 Electronically Signed   By: San Morelle M.D.   On: 07/17/2016 17:37    Mercy Riding, MD 07/17/2016, 8:57 PM PGY-2, [place] Intern pager: [phone], text pages welcome

## 2016-07-17 NOTE — ED Triage Notes (Signed)
Pt in from home, pt has a PCA who sees him at home, pt last seen normal yesterday @ 14:00, unknown onset of slurred speech and HTN, pt takes Plavix, hx of CVA # 3, pts PCA reports L facial droop worsening 30 mins ago, CODE STROKE called in triage, pt A&O x4, answers all questions appropriately, slurred speech present

## 2016-07-17 NOTE — Consult Note (Signed)
Neurology Consult Note  Reason for Consultation: Code stroke  Requesting provider: Mackuen MD  CC: Left-sided weakness, slurred speech  HPI: This is a 66 year old right-handed [name] who was brought to the emergency department by his primary care aide. History is obtained from the patient. His gait is present at the bedside and offers additional information as needed.  The patient reports that he developed weakness in his left arm on 07/11/16. This weakness has been present ever since. He describes difficulty moving his left arm and says that he was unable to hold onto anything with his left hand. He states that he did not seek medical attention at that time because he "had other things to do." Today, he noticed worsening of the weakness in his left arm, and now is unable to move it at all. In addition, he has noticed some increasing weakness in his left leg today as well as some slurred speech. His healthcare aide reports that she noticed he was having difficulty walking and not why she had him brought to the emergency department.  The patient reports that he is supposed be taking a baby aspirin every day but he does not. He says he only takes when he feels like he needs it which is not very often. He continues to smoke. He tells me that he smokes crack and he last used crack cocaine yesterday. He has a history of 3 prior strokes.  The patient was taken for an emergent CT scan of the head which showed evidence of an acute to subacute right frontal lobe infarction without hemorrhage. He is not a candidate for thrombolytic therapy due to the fact that he is way outside the window and artery has evidence of maturing infarct on CT. Code stroke was therefore canceled and he will be admitted for routine stroke evaluation.  Last known well: 07/11/16 NIHSS score: 9 tPA given?: No--way out of the window   PMH:  Past Medical History:  Diagnosis Date  . Alcohol abuse    H/O  . Anxiety   . Arthritis   .  Asthma   . CAD (coronary artery disease) 05/27/10   Cath: severe single vessell CAD left cx midportion obtuse marginal 2 to 3.  [name] CHF (congestive heart failure) ([place])   . COPD (chronic obstructive pulmonary disease) ([place])   . COPD (chronic obstructive pulmonary disease) ([place])   . Depression   . Diabetes mellitus, type 2 ([place]) 03/13/2015  . GERD (gastroesophageal reflux disease)   . HCAP (healthcare-associated pneumonia) 10/15/2015  . History of DVT of lower extremity   . Hyperlipemia   . Hyperlipidemia   . Hypertension   . Myocardial infarction ([place]) 2000  . Orbital fracture ([place]) 12/2012  . OSA on CPAP   . Peripheral vascular disease, unspecified ([place])    08/20/10 doppler: increase in right ABI post-op. Left ABI stable. S/P bi-fem bypass surgery  . Pneumonia 04/04/2012  . Shortness of breath   . Stroke ([place])   . Tobacco abuse     PSH:  Past Surgical History:  Procedure Laterality Date  . abdoninal ao angio & bifem angio  05/27/10   Patent graft, occluded bil stents with no retrograde flow into the hypogastric arteries. 100% occl left ant. tibial artery, 70% to 80% to 100% stenosis right superficial fem artery above adductor canal. 100 % occl right ant. tibial vessell  . Aortogram w/ PTCA  09/292003  . BACK SURGERY    . CARDIAC CATHETERIZATION  05/27/10   severe  CAD left cx  . CHOLECYSTECTOMY    . ESOPHAGOGASTRIC FUNDOPLICATION    . EYE SURGERY    . FEMORAL BYPASS  08/19/10   Right Fem-Pop  . IR RADIOLOGIST EVAL & MGMT  05/27/2016  . JOINT REPLACEMENT    . PERIPHERAL VASCULAR CATHETERIZATION N/A 12/10/2014   Procedure: Abdominal Aortogram;  Surgeon: Angelia Mould, MD;  Location: [facility];  Service: Cardiovascular;  Laterality: N/A;  . RADIOLOGY WITH ANESTHESIA N/A 02/08/2015   Procedure: RADIOLOGY WITH ANESTHESIA;  Surgeon: Luanne Bras, MD;  Location: [facility];  Service: Radiology;  Laterality: N/A;  . TEE WITHOUT CARDIOVERSION N/A 08/29/2014   Procedure:  TRANSESOPHAGEAL ECHOCARDIOGRAM (TEE);  Surgeon: Josue Hector, MD;  Location: [facility];  Service: Cardiovascular;  Laterality: N/A;  . TOTAL KNEE ARTHROPLASTY      Family history: Family History  Problem Relation Age of Onset  . Heart attack Mother   . Heart failure Mother   . Cirrhosis Father   . Heart failure Brother   . Cancer Brother   . Hypertension Neg Hx        UNKNOWN  . Stroke Neg Hx        UNKNOWN    Social history:  Social History   Social History  . Marital status: Single    Spouse name: N/A  . Number of children: N/A  . Years of education: N/A   Occupational History  . Not on file.   Social History Main Topics  . Smoking status: Current Every Day Smoker    Packs/day: 1.00    Years: 50.00    Types: Cigars, Cigarettes    Start date: 02/09/1961  . Smokeless tobacco: Former Systems developer    Types: Chew     Comment: 2 ppd, full flavor  . Alcohol use No  . Drug use: No  . Sexual activity: Yes   Other Topics Concern  . Not on file   Social History Narrative   Recovering alcoholic    Current inpatient meds: Medications reviewed and reconciled No current facility-administered medications for this encounter.    Current Outpatient Prescriptions  Medication Sig Dispense Refill  . ACCU-CHEK SOFTCLIX LANCETS lancets Use 3 times daily before meals 100 each 5  . acetaminophen-codeine (TYLENOL #3) 300-30 MG tablet Take 1 tablet by mouth every 8 (eight) hours as needed for moderate pain. 60 tablet 1  . albuterol (PROAIR HFA) 108 (90 Base) MCG/ACT inhaler Inhale 2 puffs into the lungs every 6 (six) hours as needed for wheezing or shortness of breath. 8.5 g 3  . albuterol (PROVENTIL) (2.5 MG/3ML) 0.083% nebulizer solution Take 3 mLs (2.5 mg total) by nebulization every 6 (six) hours as needed for wheezing or shortness of breath. 75 mL 3  . allopurinol (ZYLOPRIM) 300 MG tablet Take 1 tablet (300 mg total) by mouth daily. 30 tablet 3  . aspirin 325 MG EC tablet Take 1  tablet (325 mg total) by mouth daily. 30 tablet 0  . atorvastatin (LIPITOR) 40 MG tablet Take 1 tablet (40 mg total) by mouth daily at 6 PM. 30 tablet 5  . Blood Glucose Monitoring Suppl (ACCU-CHEK AVIVA) device Use 3 times daily before meals. 1 each 0  . carvedilol (COREG) 25 MG tablet Take 0.5 tablets (12.5 mg total) by mouth 2 (two) times daily with a meal. 60 tablet 3  . clopidogrel (PLAVIX) 75 MG tablet Take 1 tablet (75 mg total) by mouth daily. 30 tablet 3  . colchicine 0.6 MG  tablet Take 2 tabs (1.2 mg) at the onset of a gout attack, may repeat 1 tablet (0.6 mg) in one hour if symptoms persist (Patient not taking: Reported on 04/28/2016) 30 tablet 2  . FLUoxetine (PROZAC) 20 MG tablet Take 1 tablet (20 mg total) by mouth daily. 30 tablet 3  . furosemide (LASIX) 40 MG tablet Take 1.5 tablets (60 mg total) by mouth 2 (two) times daily. 90 tablet 3  . gabapentin (NEURONTIN) 300 MG capsule Take 2 capsules (600 mg total) by mouth 3 (three) times daily. 180 capsule 3  . glucose blood (ACCU-CHEK AVIVA) test strip Use 3 times daily before meals as directed. 100 each 12  . lactulose (CHRONULAC) 10 GM/15ML solution Take 15 mLs (10 g total) by mouth 2 (two) times daily as needed for mild constipation. 946 mL 2  . Lancet Devices (ACCU-CHEK SOFTCLIX) lancets Use 3 times daily before meals 1 each 5  . lisinopril (PRINIVIL,ZESTRIL) 10 MG tablet Take 1 tablet (10 mg total) by mouth daily. 90 tablet 3  . memantine (NAMENDA) 10 MG tablet Take 1 tablet (10 mg total) by mouth 2 (two) times daily. 60 tablet 3  . Misc. Devices (ROLLATOR) MISC 1 each by Does not apply route daily. 1 each 0  . pantoprazole (PROTONIX) 40 MG tablet Take 1 tablet (40 mg total) by mouth every morning. 30 tablet 3  . tiZANidine (ZANAFLEX) 4 MG tablet Take 1 tablet (4 mg total) by mouth every 8 (eight) hours as needed for muscle spasms. 90 tablet 3  . traZODone (DESYREL) 50 MG tablet Take 1 tablet (50 mg total) by mouth at bedtime as  needed for sleep. 30 tablet 3  . triamcinolone cream (KENALOG) 0.1 % Apply 1 application topically daily as needed (for dermatitis). 30 g 0    Allergies: Allergies  Allergen Reactions  . Darvocet [Propoxyphene N-Acetaminophen] Hives  . Haldol [Haloperidol Decanoate] Hives  . Acetaminophen Nausea Only    Upset stomach, tolerates Hydrocodone/APAP if taken with food  . Metformin And Related Nausea And Vomiting  . Norco [Hydrocodone-Acetaminophen] Nausea And Vomiting    Tolerates if taken with food    ROS: As per HPI. A full 14-point review of systems was performed and is otherwise unremarkable.   PE:  BP (!) 157/88   Pulse 76   Resp (!) 21   Wt 103.8 kg (228 lb 13.4 oz)   SpO2 97%   BMI 31.04 kg/m   General: WD obese Caucasian [name] lying on ED gurney. He appears mildly unkempt. He is alert and oriented 4. He has mild to moderate dysarthria. No obvious aphasia. Follows commands briskly. Affect is bright with congruent mood. Comportment is normal.  HEENT: Normocephalic. Neck supple without LAD. MMM, OP clear. Sclerae anicteric. No conjunctival injection.  CV: Regular, no obvious murmur. Carotid pulses full and symmetric, no bruits. Distal pulses 2+ and symmetric.  Lungs: CTAB.  Abdomen: Soft, obese, non-distended, non-tender. Bowel sounds present x4.  Extremities: Mild edema noted in both lower extremities. Neuro:  CN: Pupils are equal and round. They are symmetrically reactive from 3-->2 mm. EOMI without nystagmus. No reported diplopia. He has some breakup smooth pursuits. Facial sensation is intact to light touch. He may have some very mild asymmetry of the left mouth but this is difficult to ascertain because of his beard.[name] Hearing is intact to conversational voice. Palate elevates symmetrically and uvula is midline. Voice is normal in tone, pitch and quality. Bilateral SCM and trapezii are 5/5. Tongue  is midline with normal bulk and mobility.  Motor: Normal bulk. Left upper  extremity is flaccid with no spontaneous or volitional movement. He has variable effort with strength testing in the left lower extremity but appears to be 4/5 in that limb. Right-sided strength is normal. No tremor or other abnormal movements.  Sensation: Intact to light touch and pinprick.  DTRs: Absent throughout. Toes mute bilaterally.  Coordination: Finger-to-nose and heel-to-shin are without dysmetria. He is unable to perform finger-to-nose with the left hand because of his weakness. Heel-to-shin on the left is a little bit slower because of weakness.  Labs:  Lab Results  Component Value Date   WBC 6.1 07/17/2016   HGB 15.0 07/17/2016   HCT 44.0 07/17/2016   PLT 147 (L) 07/17/2016   GLUCOSE 86 07/17/2016   CHOL 233 (H) 09/27/2015   TRIG 146 09/27/2015   HDL 48 09/27/2015   LDLCALC 156 (H) 09/27/2015   ALT 15 05/04/2016   AST 11 05/04/2016   NA 145 07/17/2016   K 3.8 07/17/2016   CL 107 07/17/2016   CREATININE 1.00 07/17/2016   BUN 13 07/17/2016   CO2 26 05/29/2016   TSH 0.474 10/15/2015   INR 1.06 10/15/2015   HGBA1C 5.7 04/28/2016   MICROALBUR 0.8 04/28/2016    Imaging:  I have personally and independently reviewed the CT scan of the head without contrast from today. This shows a focal area of hypodensity involving the posterior right frontal lobe, likely involving the precentral gyrus. This is consistent with a subacute ischemic infarction. Mild chronic small vessel ischemic disease is present. There is a right cavernous internal carotid artery stent in place.  Assessment and Plan:  1. Acute Ischemic Stroke: This probably actually occurred on 07/11/16 but may have extended some today because of his additional symptoms. He has evidence of a subacute infarction in the right frontal lobe, likely involving the precentral gyrus. This would explain his left arm symptoms. Known risk factors for cerebrovascular disease in this patient include age, coronary artery disease, diabetes,  hyperlipidemia, hyper tension, history of prior stroke, obstructive sleep apnea, peripheral vascular disease, obesity, tobacco abuse, and crack cocaine abuse. Additional workup will be ordered to include MRI brain, MRA of the head, carotid Dopplers, TTE, fasting lipids, and hemoglobin A1c. Further testing will be determined by results from these initial studies. He was supposed be taking aspirin and Plavix at home but he has been noncompliant with at least his aspirin. These can be resumed. Continue statin with goal LDL less than 70. Tobacco and cocaine cessation are imperative for long-term risk factor reduction and secondary stroke prevention. Ensure adequate glucose control. Allow permissive hypertension in the acute phase, treating only SBP greater than 220 mmHg and/or DBP greater than 110 mmHg. Avoid fever and hyperglycemia as these can extend the infarct. Avoid hypotonic IVF to minimize exacerbation of post-stroke edema. Initiate rehab services. DVT prophylaxis as needed.   2. Left hemiparesis: This is due to stroke. He had some left arm weakness for the past 6 days but this got worse today and was accompanied by new left leg weakness. PT/OT/rehabilitation.  3. Dysarthria: This is acute, due to stroke. He should be given nothing by mouth until he passes a swallow evaluation. SLP/rehabilitation.  4. Abnormality of gait: This is acute, due to stroke. PT/rehabilitation.   This was discussed with the patient and his home health aide who was present in the emergency department. Education was provided on the diagnosis and expected evaluation and  treatment. They are in agreement with the plan as noted. They were given the opportunity to ask any questions and these were addressed to their satisfaction.   I discussed the above with Dr. Thomasene Lot in the emergency department at the time of my visit.  Thank you for this consultation. The stroke team will assume care of the patient beginning 07/18/16.

## 2016-07-17 NOTE — ED Notes (Signed)
Neurologist at bedside. 

## 2016-07-17 NOTE — ED Notes (Signed)
Patient transported to X-ray on heart monitor

## 2016-07-17 NOTE — ED Provider Notes (Signed)
[facility] DEPT Provider Note   CSN: [id number] Arrival date & time: 07/17/16  1649     History   Chief Complaint Chief Complaint  Patient presents with  . Hypertension  . Code Stroke    HPI [name] is a 66 y.o. male.  HPI   Patient is a 66 year old male presenting with slurred speech and left arm and leg weakness. Patient aide initially stated that happened 30 minutes prior to arrival. However patient reports that it was actually happening since Saturday.  Past Medical History:  Diagnosis Date  . Alcohol abuse    H/O  . Anxiety   . Arthritis   . Asthma   . CAD (coronary artery disease) 05/27/10   Cath: severe single vessell CAD left cx midportion obtuse marginal 2 to 3.  [name] CHF (congestive heart failure) ([place])   . COPD (chronic obstructive pulmonary disease) ([place])   . COPD (chronic obstructive pulmonary disease) ([place])   . Depression   . Diabetes mellitus, type 2 ([place]) 03/13/2015  . GERD (gastroesophageal reflux disease)   . HCAP (healthcare-associated pneumonia) 10/15/2015  . History of DVT of lower extremity   . Hyperlipemia   . Hyperlipidemia   . Hypertension   . Myocardial infarction ([place]) 2000  . Orbital fracture ([place]) 12/2012  . OSA on CPAP   . Peripheral vascular disease, unspecified ([place])    08/20/10 doppler: increase in right ABI post-op. Left ABI stable. S/P bi-fem bypass surgery  . Pneumonia 04/04/2012  . Shortness of breath   . Stroke ([place])   . Tobacco abuse     Patient Active Problem List   Diagnosis Date Noted  . Stroke-like symptoms 07/17/2016  . Insomnia 01/29/2016  . Depression 01/29/2016  . Alcohol abuse with intoxication ([place]) 10/16/2015  . Psoriasiform dermatitis 07/12/2015  . Dementia due to another medical condition 07/12/2015  . Urinary incontinence 03/14/2015  . Diabetes mellitus, type 2 ([place]) 03/13/2015  . Hemiparesis affecting left side as late effect of cerebrovascular accident ([place]) 02/18/2015  . Cerebrovascular  accident (CVA) due to stenosis of right carotid artery ([place])   . Coronary artery disease involving native coronary artery of native heart without angina pectoris   . Tachypnea   . Thrombocytopenia ([place])   . History of stroke   . Stroke (cerebrum) ([place]) 02/06/2015  . Arterial ischemic stroke, MCA (middle cerebral artery), right, acute ([place])   . DOE (dyspnea on exertion) 11/23/2014  . PAF (paroxysmal atrial fibrillation) ([place]) 11/23/2014  . HLD (hyperlipidemia) 11/01/2014  . Tobacco use disorder 11/01/2014  . Diastolic CHF, acute on chronic (HCC) 11/01/2014  . Cerebral infarction due to stenosis of right carotid artery ([place]) 11/01/2014  . Carotid stenosis 11/01/2014  . Hyperkalemia 10/24/2014  . H/O total knee replacement 10/24/2014  . Essential hypertension   . Polysubstance abuse 08/28/2014  . Cocaine abuse   . Intracranial carotid stenosis   . Headache around the eyes 08/27/2014  . Acute CVA (cerebrovascular accident) ([place]) 08/27/2014  . CVA (cerebral vascular accident) ([place]) 08/26/2014  . History of DVT (deep vein thrombosis)   . Alcoholism ([place])   . Embolic stroke ([place])   . Cerebral infarction due to unspecified mechanism   . Hypotension   . AKI (acute kidney injury) ([place])   . Stroke ([place])   . CVA (cerebral infarction) 08/01/2014  . Alcohol dependence with withdrawal with complication ([place]) 25/42/7062  . DVT (deep venous thrombosis) ([place]) 02/22/2014  . Lower extremity edema 02/21/2014  . Chronic diastolic congestive  heart failure ([place]) 11/21/2013  . CAD (coronary artery disease) 11/21/2013  . Alcohol abuse 11/21/2013  . GERD (gastroesophageal reflux disease) 08/10/2013  . Gout 08/10/2013  . Tobacco abuse 07/26/2013  . Trichiasis without entropion 04/16/2013  . Dizziness 04/10/2013  . Eyelid lesion 01/24/2013  . Enophthalmos due to trauma 01/19/2013  . Medial orbital wall fracture (HCC) 01/19/2013  . Closed blow-out fracture of floor of orbit ([place]) 01/19/2013  .  Binocular vision disorder with diplopia 01/03/2013  . Fracture of inferior orbital wall ([place]) 01/03/2013  . Fracture of orbital floor ([place]) 01/03/2013  . Pain of left eye 01/03/2013  . Peripheral vascular disease ([place]) 07/27/2012  . COPD (chronic obstructive pulmonary disease) ([place]) 04/04/2012  . HTN (hypertension) 04/04/2012    Past Surgical History:  Procedure Laterality Date  . abdoninal ao angio & bifem angio  05/27/10   Patent graft, occluded bil stents with no retrograde flow into the hypogastric arteries. 100% occl left ant. tibial artery, 70% to 80% to 100% stenosis right superficial fem artery above adductor canal. 100 % occl right ant. tibial vessell  . Aortogram w/ PTCA  09/292003  . BACK SURGERY    . CARDIAC CATHETERIZATION  05/27/10   severe CAD left cx  . CHOLECYSTECTOMY    . ESOPHAGOGASTRIC FUNDOPLICATION    . EYE SURGERY    . FEMORAL BYPASS  08/19/10   Right Fem-Pop  . IR RADIOLOGIST EVAL & MGMT  05/27/2016  . JOINT REPLACEMENT    . PERIPHERAL VASCULAR CATHETERIZATION N/A 12/10/2014   Procedure: Abdominal Aortogram;  Surgeon: Angelia Mould, MD;  Location: [facility];  Service: Cardiovascular;  Laterality: N/A;  . RADIOLOGY WITH ANESTHESIA N/A 02/08/2015   Procedure: RADIOLOGY WITH ANESTHESIA;  Surgeon: Luanne Bras, MD;  Location: [facility];  Service: Radiology;  Laterality: N/A;  . TEE WITHOUT CARDIOVERSION N/A 08/29/2014   Procedure: TRANSESOPHAGEAL ECHOCARDIOGRAM (TEE);  Surgeon: Josue Hector, MD;  Location[facility];  Service: Cardiovascular;  Laterality: N/A;  . TOTAL KNEE ARTHROPLASTY         Home Medications    Prior to Admission medications   Medication Sig Start Date End Date Taking? Authorizing Provider  ACCU-CHEK SOFTCLIX LANCETS lancets Use 3 times daily before meals 09/17/15  Yes Amao, Charlane Ferretti, MD  albuterol (PROAIR HFA) 108 (90 Base) MCG/ACT inhaler Inhale 2 puffs into the lungs every 6 (six) hours as needed for wheezing or  shortness of breath. 04/28/16  Yes Amao, Charlane Ferretti, MD  albuterol (PROVENTIL) (2.5 MG/3ML) 0.083% nebulizer solution Take 3 mLs (2.5 mg total) by nebulization every 6 (six) hours as needed for wheezing or shortness of breath. 04/28/16  Yes Arnoldo Morale, MD  allopurinol (ZYLOPRIM) 300 MG tablet Take 1 tablet (300 mg total) by mouth daily. 04/28/16  Yes Arnoldo Morale, MD  aspirin 325 MG EC tablet Take 1 tablet (325 mg total) by mouth daily. 01/16/15  Yes Arnoldo Morale, MD  atorvastatin (LIPITOR) 40 MG tablet Take 1 tablet (40 mg total) by mouth daily at 6 PM. 04/28/16  Yes Amao, Enobong, MD  Blood Glucose Monitoring Suppl (ACCU-CHEK AVIVA) device Use 3 times daily before meals. 03/13/15  Yes Arnoldo Morale, MD  carvedilol (COREG) 25 MG tablet Take 0.5 tablets (12.5 mg total) by mouth 2 (two) times daily with a meal. 06/01/16  Yes Amao, Charlane Ferretti, MD  clopidogrel (PLAVIX) 75 MG tablet Take 1 tablet (75 mg total) by mouth daily. 04/28/16  Yes Arnoldo Morale, MD  colchicine 0.6 MG tablet Take  2 tabs (1.2 mg) at the onset of a gout attack, may repeat 1 tablet (0.6 mg) in one hour if symptoms persist 10/16/15  Yes Molt, Bethany, DO  FLUoxetine (PROZAC) 20 MG tablet Take 1 tablet (20 mg total) by mouth daily. 04/28/16  Yes Arnoldo Morale, MD  furosemide (LASIX) 40 MG tablet Take 1.5 tablets (60 mg total) by mouth 2 (two) times daily. 04/28/16  Yes Arnoldo Morale, MD  gabapentin (NEURONTIN) 300 MG capsule Take 2 capsules (600 mg total) by mouth 3 (three) times daily. 04/28/16  Yes Arnoldo Morale, MD  glucose blood (ACCU-CHEK AVIVA) test strip Use 3 times daily before meals as directed. 01/29/16  Yes Arnoldo Morale, MD  hydrALAZINE (APRESOLINE) 50 MG tablet Take 50 mg by mouth daily.  06/18/16  Yes [provider]  HYDROcodone-acetaminophen (NORCO/VICODIN) 5-325 MG tablet Take 1 tablet by mouth every 6 (six) hours as needed. 06/18/16  Yes [provider]  lactulose (CHRONULAC) 10 GM/15ML solution Take 15 mLs (10 g  total) by mouth 2 (two) times daily as needed for mild constipation. 04/28/16  Yes Arnoldo Morale, MD  Lancet Devices [facility]) lancets Use 3 times daily before meals 09/27/15  Yes Amao, Enobong, MD  lisinopril (PRINIVIL,ZESTRIL) 10 MG tablet Take 1 tablet (10 mg total) by mouth daily. 04/28/16  Yes Arnoldo Morale, MD  memantine (NAMENDA) 10 MG tablet Take 1 tablet (10 mg total) by mouth 2 (two) times daily. 04/28/16  Yes Arnoldo Morale, MD  Misc. Devices (ROLLATOR) MISC 1 each by Does not apply route daily. 02/27/15  Yes Arnoldo Morale, MD  pantoprazole (PROTONIX) 40 MG tablet Take 1 tablet (40 mg total) by mouth every morning. 04/28/16  Yes Arnoldo Morale, MD  tiZANidine (ZANAFLEX) 4 MG tablet Take 1 tablet (4 mg total) by mouth every 8 (eight) hours as needed for muscle spasms. 04/28/16  Yes Arnoldo Morale, MD  traZODone (DESYREL) 50 MG tablet Take 1 tablet (50 mg total) by mouth at bedtime as needed for sleep. 04/28/16  Yes Arnoldo Morale, MD  triamcinolone cream (KENALOG) 0.1 % Apply 1 application topically daily as needed (for dermatitis). 06/02/16  Yes Arnoldo Morale, MD    Family History Family History  Problem Relation Age of Onset  . Heart attack Mother   . Heart failure Mother   . Cirrhosis Father   . Heart failure Brother   . Cancer Brother   . Hypertension Neg Hx        UNKNOWN  . Stroke Neg Hx        UNKNOWN    Social History Social History  Substance Use Topics  . Smoking status: Current Every Day Smoker    Packs/day: 1.00    Years: 50.00    Types: Cigars, Cigarettes    Start date: 02/09/1961  . Smokeless tobacco: Former Systems developer    Types: Chew     Comment: 2 ppd, full flavor  . Alcohol use No     Allergies   Darvocet [propoxyphene n-acetaminophen]; Haldol [haloperidol decanoate]; Acetaminophen; Metformin and related; and Norco [hydrocodone-acetaminophen]   Review of Systems Review of Systems  Constitutional: Negative for fatigue and fever.  Respiratory: Negative for  shortness of breath and stridor.   Cardiovascular: Negative for chest pain.  Gastrointestinal: Negative for abdominal pain.  Neurological: Positive for speech difficulty and weakness. Negative for dizziness and numbness.     Physical Exam Updated Vital Signs BP (!) 171/82   Pulse 71   Temp 97.8 F (36.6 C) (Oral)  Resp (!) 26   Wt 103.8 kg (228 lb 13.4 oz)   SpO2 96%   BMI 31.04 kg/m   Physical Exam  Constitutional: He is oriented to person, place, and time. He appears well-nourished.  HENT:  Head: Normocephalic and atraumatic.  Eyes: Conjunctivae are normal.  Cardiovascular: Normal rate and regular rhythm.   Pulmonary/Chest: Effort normal and breath sounds normal. No respiratory distress. He has no wheezes.  Musculoskeletal: He exhibits edema.  Neurological: He is oriented to person, place, and time.  Slurred speech left-sided arm and leg weakness.  Skin: Skin is warm and dry. He is not diaphoretic.  Psychiatric: He has a normal mood and affect. His behavior is normal.     ED Treatments / Results  Labs (all labs ordered are listed, but only abnormal results are displayed) Labs Reviewed  CBC - Abnormal; Notable for the following:       Result Value   Platelets 147 (*)    All other components within normal limits  COMPREHENSIVE METABOLIC PANEL - Abnormal; Notable for the following:    Total Protein 6.4 (*)    ALT 12 (*)    All other components within normal limits  RAPID URINE DRUG SCREEN, HOSP PERFORMED - Abnormal; Notable for the following:    Cocaine POSITIVE (*)    All other components within normal limits  PROTIME-INR  APTT  DIFFERENTIAL  ETHANOL  URINALYSIS, ROUTINE W REFLEX MICROSCOPIC  HEMOGLOBIN A1C  LIPID PANEL  I-STAT TROPOININ, ED  CBG MONITORING, ED  I-STAT CHEM 8, ED    EKG  EKG Interpretation None       Radiology Dg Chest 2 View  Result Date: 07/17/2016 CLINICAL DATA:  Chest pain.  Recent stroke. EXAM: CHEST  2 VIEW COMPARISON:   05/29/2016 and prior radiographs FINDINGS: Cardiomegaly again noted. There is no evidence of focal airspace disease, pulmonary edema, suspicious pulmonary nodule/mass, pleural effusion, or pneumothorax. No acute bony abnormalities are identified. IMPRESSION: Cardiomegaly without evidence of acute cardiopulmonary disease. Electronically Signed   By: Margarette Canada M.D.   On: 07/17/2016 18:36   Ct Head Code Stroke W/o Cm  Result Date: 07/17/2016 CLINICAL DATA:  Code stroke. Right-sided facial droop beginning 1 hour ago. Left-sided weakness beginning 6 days ago. EXAM: CT HEAD WITHOUT CONTRAST TECHNIQUE: Contiguous axial images were obtained from the base of the skull through the vertex without intravenous contrast. COMPARISON:  CT head without contrast 05/29/2016. FINDINGS: Brain: An acute/subacute posterior right frontal lobe nonhemorrhagic infarct is more prominent than on the prior exam. No acute or subacute left-sided infarct is present. The basal ganglia are intact bilaterally. The insular ribbon is normal. No acute hemorrhage or mass lesion is present. The ventricles are of normal size. No significant extra-axial fluid collection is present. Vascular: A cavernous right internal carotid artery stent is in place. There is no hyperdense vessel. Atherosclerotic changes are noted bilaterally. Skull: The calvarium is intact. No focal lytic or blastic lesions are present. Sinuses/Orbits: The left maxillary sinus roof and orbital floor is status post ORIF. Left maxillary sinus is chronically shrunken. The remaining paranasal sinuses and the mastoid air cells are clear bilaterally. Rightward nasal septal deviation is present. Bilateral lens replacements are present. The globes and orbits are within normal limits otherwise. ASPECTS [facility] Stroke Program Early CT Score) - Ganglionic level infarction (caudate, lentiform nuclei, internal capsule, insula, M1-M3 cortex): 7/7 - Supraganglionic infarction (M4-M6 cortex): 2/3  Total score (0-10 with 10 being normal): 9/10 IMPRESSION: 1. Posterior right  frontal lobe acute/subacute infarct is more prominent than on the study 2 months ago. This may reflect evolution of the previous infarct or a a progressive new infarct. 2. No other acute infarct. 3. ASPECTS is 9/10 Electronically Signed   By: San Morelle M.D.   On: 07/17/2016 17:37    Procedures Procedures (including critical care time)  Medications Ordered in ED Medications   stroke: mapping our early stages of recovery book (not administered)  aspirin suppository 300 mg (300 mg Rectal Given 07/17/16 1851)    Or  aspirin tablet 325 mg ( Oral See Alternative 07/17/16 1851)  albuterol (PROVENTIL) (2.5 MG/3ML) 0.083% nebulizer solution 2.5 mg (not administered)  insulin aspart (novoLOG) injection 0-9 Units (not administered)  insulin aspart (novoLOG) injection 0-5 Units (not administered)  nicotine (NICODERM CQ - dosed in mg/24 hours) patch 21 mg (not administered)  0.9 %  sodium chloride infusion (not administered)     Initial Impression / Assessment and Plan / ED Course  I have reviewed the triage vital signs and the nursing notes.  Pertinent labs & imaging results that were available during my care of the patient were reviewed by me and considered in my medical decision making (see chart for details).      Patient is 66 year old male with past medical history significant for diabetes hypertension CHF COPD and CVA with mild residual left hemiparesis, hyperlipidemia gout dementia and peripheral vascular disease. Patient presented with worsening left sided deficits and slurred speech.  Initially called a code stroke. But patient is outside the window, this is been going on since Saturday.  Will look for other signs of infection, but plan admit for stroke workup.    Final Clinical Impressions(s) / ED Diagnoses   Final diagnoses:  Acute ischemic stroke [facility])  Acute ischemic stroke [facility])    New  Prescriptions Current Discharge Medication List       Macarthur Critchley, MD 07/17/16 2354

## 2016-07-18 ENCOUNTER — Encounter (HOSPITAL_COMMUNITY): Payer: Self-pay

## 2016-07-18 ENCOUNTER — Inpatient Hospital Stay (HOSPITAL_COMMUNITY): Payer: Medicare Other

## 2016-07-18 DIAGNOSIS — E785 Hyperlipidemia, unspecified: Secondary | ICD-10-CM

## 2016-07-18 DIAGNOSIS — I6330 Cerebral infarction due to thrombosis of unspecified cerebral artery: Secondary | ICD-10-CM

## 2016-07-18 DIAGNOSIS — I361 Nonrheumatic tricuspid (valve) insufficiency: Secondary | ICD-10-CM

## 2016-07-18 DIAGNOSIS — I441 Atrioventricular block, second degree: Secondary | ICD-10-CM

## 2016-07-18 DIAGNOSIS — I639 Cerebral infarction, unspecified: Secondary | ICD-10-CM

## 2016-07-18 DIAGNOSIS — I472 Ventricular tachycardia: Secondary | ICD-10-CM

## 2016-07-18 DIAGNOSIS — G8194 Hemiplegia, unspecified affecting left nondominant side: Secondary | ICD-10-CM

## 2016-07-18 HISTORY — DX: Cerebral infarction due to thrombosis of unspecified cerebral artery: I63.30

## 2016-07-18 LAB — ECHOCARDIOGRAM COMPLETE
CHL CUP MV DEC (S): 211
EERAT: 13.89
EWDT: 211 ms
FS: 27 % — AB (ref 28–44)
HEIGHTINCHES: 72 in
IV/PV OW: 0.79
LA ID, A-P, ES: 44 mm
LA diam end sys: 44 mm
LA vol A4C: 84.9 ml
LA vol index: 32.1 mL/m2
LA vol: 72.9 mL
LADIAMINDEX: 1.94 cm/m2
LV TDI E'LATERAL: 5.66
LVEEAVG: 13.89
LVEEMED: 13.89
LVELAT: 5.66 cm/s
LVOT VTI: 17.4 cm
LVOT area: 4.52 cm2
LVOT diameter: 24 mm
LVOT peak grad rest: 3 mmHg
LVOTPV: 82 cm/s
LVOTSV: 79 mL
MV pk A vel: 115 m/s
MVPG: 2 mmHg
MVPKEVEL: 78.6 m/s
PW: 14 mm — AB (ref 0.6–1.1)
RV LATERAL S' VELOCITY: 14.3 cm/s
TAPSE: 28.5 mm
TDI e' medial: 6.64
WEIGHTICAEL: 3716.8 [oz_av]

## 2016-07-18 LAB — GLUCOSE, CAPILLARY
GLUCOSE-CAPILLARY: 172 mg/dL — AB (ref 65–99)
GLUCOSE-CAPILLARY: 91 mg/dL (ref 65–99)
Glucose-Capillary: 99 mg/dL (ref 65–99)
Glucose-Capillary: 108 mg/dL — ABNORMAL HIGH (ref 65–99)

## 2016-07-18 LAB — LIPID PANEL
CHOLESTEROL: 155 mg/dL (ref 0–200)
HDL: 36 mg/dL — ABNORMAL LOW (ref >40)
LDL CALC: 95 mg/dL (ref 0–99)
Total CHOL/HDL Ratio: 4.3 RATIO
Triglycerides: 121 mg/dL (ref <150)
VLDL: 24 mg/dL (ref 0–40)

## 2016-07-18 MED ORDER — MEMANTINE HCL 10 MG PO TABS
10.0000 mg | ORAL_TABLET | Freq: Two times a day (BID) | ORAL | Status: DC
  Administered 2016-07-18 – 2016-07-21 (×6): 10 mg via ORAL
  Filled 2016-07-18 (×7): qty 1

## 2016-07-18 MED ORDER — ATORVASTATIN CALCIUM 40 MG PO TABS
40.0000 mg | ORAL_TABLET | Freq: Every day | ORAL | Status: DC
  Administered 2016-07-18 – 2016-07-19 (×2): 40 mg via ORAL
  Filled 2016-07-18 (×3): qty 1

## 2016-07-18 MED ORDER — PANTOPRAZOLE SODIUM 40 MG PO TBEC
40.0000 mg | DELAYED_RELEASE_TABLET | ORAL | Status: DC
  Administered 2016-07-19 – 2016-07-21 (×2): 40 mg via ORAL
  Filled 2016-07-18 (×3): qty 1

## 2016-07-18 MED ORDER — FLUOXETINE HCL 20 MG PO TABS
20.0000 mg | ORAL_TABLET | Freq: Every day | ORAL | Status: DC
  Filled 2016-07-18: qty 1

## 2016-07-18 MED ORDER — ALLOPURINOL 100 MG PO TABS
300.0000 mg | ORAL_TABLET | Freq: Every day | ORAL | Status: DC
  Administered 2016-07-18 – 2016-07-21 (×4): 300 mg via ORAL
  Filled 2016-07-18 (×4): qty 3

## 2016-07-18 MED ORDER — FLUOXETINE HCL 20 MG PO CAPS
20.0000 mg | ORAL_CAPSULE | Freq: Every day | ORAL | Status: DC
  Administered 2016-07-18 – 2016-07-21 (×4): 20 mg via ORAL
  Filled 2016-07-18 (×4): qty 1

## 2016-07-18 MED ORDER — TRAZODONE HCL 50 MG PO TABS
50.0000 mg | ORAL_TABLET | Freq: Every evening | ORAL | Status: DC | PRN
  Administered 2016-07-18 – 2016-07-19 (×2): 50 mg via ORAL
  Filled 2016-07-18 (×2): qty 1

## 2016-07-18 MED ORDER — IOPAMIDOL (ISOVUE-370) INJECTION 76%
INTRAVENOUS | Status: AC
  Administered 2016-07-18: 50 mL
  Filled 2016-07-18: qty 50

## 2016-07-18 NOTE — Progress Notes (Signed)
OT Cancellation Note  Patient Details Name: [name] MRN: [id number] DOB: [birth year]   Cancelled Treatment:    Reason Eval/Treat Not Completed: Patient at procedure or test/ unavailable. Pt with MD and with transport services waiting to take pt to test when OT attempted eval earlier. Will reattempt as schedule permits.  Tyrone Schimke OTR/L Pager: [phone]   07/18/2016, 2:06 PM

## 2016-07-18 NOTE — Progress Notes (Signed)
STROKE TEAM PROGRESS NOTE   HISTORY OF PRESENT ILLNESS (per record) This is a 66 year old right-handed man who was brought to the emergency department by his primary care aide. History is obtained from the patient. His gait is present at the bedside and offers additional information as needed.  The patient reports that he developed weakness in his left arm on 07/11/16. This weakness has been present ever since. He describes difficulty moving his left arm and says that he was unable to hold onto anything with his left hand. He states that he did not seek medical attention at that time because he "had other things to do." Today, he noticed worsening of the weakness in his left arm, and now is unable to move it at all. In addition, he has noticed some increasing weakness in his left leg today as well as some slurred speech. His healthcare aide reports that she noticed he was having difficulty walking and not why she had him brought to the emergency department.  The patient reports that he is supposed be taking a baby aspirin every day but he does not. He says he only takes when he feels like he needs it which is not very often. He continues to smoke. He tells me that he smokes crack and he last used crack cocaine yesterday. He has a history of 3 prior strokes.  The patient was taken for an emergent CT scan of the head which showed evidence of an acute to subacute right frontal lobe infarction without hemorrhage. He is not a candidate for thrombolytic therapy due to the fact that he is way outside the window and artery has evidence of maturing infarct on CT. Code stroke was therefore canceled and he will be admitted for routine stroke evaluation.  Last known well: 07/11/16 NIHSS score: 9 tPA given?: No--way out of the window   SUBJECTIVE (INTERVAL HISTORY) No family members present. The patient states he lives alone. He has not been taking his aspirin on a daily basis. He is unable to move his left  upper extremity.   OBJECTIVE Temp:  [97.8 F (36.6 C)-98.6 F (37 C)] 98.2 F (36.8 C) (06/09 0600) Pulse Rate:  [67-79] 74 (06/08 2120) Cardiac Rhythm: Supraventricular tachycardia;Ventricular tachycardia (06/09 0149) Resp:  [15-27] 18 (06/09 0600) BP: (148-185)/(62-88) 175/83 (06/09 0600) SpO2:  [95 %-99 %] 97 % (06/09 0600) Weight:  [103.8 kg (228 lb 13.4 oz)-105.4 kg (232 lb 4.8 oz)] 105.4 kg (232 lb 4.8 oz) (06/08 2120)  CBC:  Recent Labs Lab 07/17/16 1655 07/17/16 1710  WBC 6.1  --   NEUTROABS 3.2  --   HGB 14.9 15.0  HCT 46.6 44.0  MCV 93.6  --   PLT 147*  --     Basic Metabolic Panel:  Recent Labs Lab 07/17/16 1655 07/17/16 1710  NA 145 145  K 3.9 3.8  CL 110 107  CO2 27  --   GLUCOSE 87 86  BUN 12 13  CREATININE 0.96 1.00  CALCIUM 9.7  --     Lipid Panel:    Component Value Date/Time   CHOL 155 07/18/2016 0535   TRIG 121 07/18/2016 0535   HDL 36 (L) 07/18/2016 0535   CHOLHDL 4.3 07/18/2016 0535   VLDL 24 07/18/2016 0535   LDLCALC 95 07/18/2016 0535   HgbA1c:  Lab Results  Component Value Date   HGBA1C 5.7 04/28/2016   Urine Drug Screen:    Component Value Date/Time   LABOPIA NONE DETECTED 07/17/2016 2234  COCAINSCRNUR POSITIVE (A) 07/17/2016 2234   LABBENZ NONE DETECTED 07/17/2016 2234   AMPHETMU NONE DETECTED 07/17/2016 2234   THCU NONE DETECTED 07/17/2016 2234   LABBARB NONE DETECTED 07/17/2016 2234    Alcohol Level     Component Value Date/Time   ETH <5 07/17/2016 2035    IMAGING  Dg Chest 2 View 07/17/2016  Cardiomegaly without evidence of acute cardiopulmonary disease.    Mr / MRA Brain Wo Contrast 07/18/2016 1. Patchy multifocal acute ischemic right MCA territory infarcts, predominantly cortical and posterior right frontal in location, with involvement of the right pre and postcentral gyri. Associated mild petechial hemorrhage without hemorrhagic transformation. Probable few scattered superimposed more subacute infarcts as  above.  2. Moderate chronic microvascular ischemic disease.   MRA HEAD  1. Absent flow void at the level of the right cavernous/supraclinoid stent with distal reconstitution at the right ICA terminus. Patent flow within the right MCA artery and its branches, although somewhat attenuated as compared to the contralateral left. Vascular patency through the stent could further evaluated with CTA as clinically desired.  2. Otherwise negative intracranial MRA.     Ct Head Code Stroke W/o Cm  07/17/2016 1. Posterior right frontal lobe acute/subacute infarct is more prominent than on the study 2 months ago. This may reflect evolution of the previous infarct or a a progressive new infarct.  2. No other acute infarct.  3. ASPECTS is 9/10     PHYSICAL EXAM  Obese middle aged Caucasian male not in distress. . Afebrile. Head is nontraumatic. Neck is supple without bruit.    Cardiac exam no murmur or gallop. Lungs are clear to auscultation. Distal pulses are well felt.  Neurological Exam ;  Awake  Alert oriented x 3. Normal speech and language.eye movements full without nystagmus.fundi were not visualized. Vision acuity and fields appear normal. Hearing is normal. Palatal movements are normal. Face asymmetric with left lower face weakness. Tongue midline. Left hemiparesis with left upper extremity strength  And left lower extremity 4/5. Normal sensation. Gait deferred.      ASSESSMENT/PLAN Mr. TAAJ HURLBUT is a 66 y.o. male with history of previous strokes, tobacco use, cocaine use, peripheral vascular disease, obstructive sleep apnea, previous right carotid artery stent, coronary artery disease with previous MI, hypertension, hyperlipidemia, history of lower extremity DVT, diabetes mellitus, COPD, congestive heart failure, alcohol abuse, anxiety and depression presenting with left upper extremity weakness.  He did not receive IV t-PA due to late presentation.   Stroke:   patchy multifocal  acute ischemic right MCA territory infarcts - likely embolic from right ICA stent.  Resultant - Plegic left upper extremity  CT head - Posterior right frontal lobe acute/subacute infarct is more prominent than on the study 2 months ago  MRI head - Patchy multifocal acute ischemic right MCA territory infarcts.  MRA head - Absent flow void at the level of the right cavernous/supraclinoid stent with distal reconstitution at the right ICA terminus.  Carotid Doppler - pending  2D Echo - pending  LDL - 95  HgbA1c - pending  VTE prophylaxis - SCDs  Diet NPO time specified  No antithrombotic prior to admission, now on aspirin 325 mg daily  Patient counseled to be compliant with his antithrombotic medications  Ongoing aggressive stroke risk factor management  Therapy recommendations: pending  Disposition: Pending  Hypertension  Stable  Permissive hypertension (OK if < 220/120) but gradually normalize in 5-7 days  Long-term BP goal normotensive  Hyperlipidemia  Home  meds:  Lipitor 40 mg daily not resumed in hospital  LDL 95, goal < 70  Consider increasing Lipitor to 80 mg daily  Continue statin at discharge  Diabetes  HgbA1c pending, goal < 7.0  Unc / Controlled   Other Stroke Risk Factors  Advanced age  Cigarette smoker - advised to stop smoking  Obesity, Body mass index is 31.51 kg/m., recommend weight loss, diet and exercise as appropriate   Hx stroke/TIA  Coronary artery disease  Obstructive sleep apnea.  Cocaine use   Other Active Problems  Medical noncompliance with medications.  Previous right carotid stent -> CTA Head and Neck  Nonsustained V. tach and AV block -> cardiology evaluating   Mikey Bussing PA-C [facility] Pager [phone] 07/18/2016, 1:17 PM   Hospital day # 1  I have personally examined this patient, reviewed notes, independently viewed imaging studies, participated in medical decision making and  plan of care.ROS completed by me personally and pertinent positives fully documented  I have made any additions or clarifications directly to the above note. Agree with note above.he presented with progressive left upper extremity and to a lesser degree left lower extremity weakness secondary to right middle cerebral artery infarct possibly related to his right terminal carotid artery stent. He has been noncompliant with his  And may have stent restenosis/occlusion.check CT angiogram of the brain. Patient counseled about the need for aggressive risk factor modification and need for antiplatelet therapy. Greater than 50% time during this 35 minute visit work spent on counseling and coordination of care about his recurrent stroke or, intracranial spent, need for aggressive risk factor modification and answering questions.  Antony Contras, MD Medical Director [facility] Pager: [phone] 07/18/2016 3:03 PM   To contact Stroke Continuity provider, please refer to [email or web address] After hours, contact General Neurology

## 2016-07-18 NOTE — Evaluation (Signed)
Physical Therapy Evaluation Patient Details Name: [name] MRN: [id number] DOB: [birth year] Today's Date: 07/18/2016   History of Present Illness  [name] is a 66 y.o. male presenting with left hemiparesis and slurred speech . PMH is significant for ?PAF (no anticoag), HTN, CAD, dCHF, substance use, history of DVT, gout, DM, carotid stenosis s/p stents, COPD, depression and dementia.  MRI showed Patchy multifocal acute ischemic right MCA territory infarcts, predominantly cortical and posterior right frontal in location, with involvement of the right pre and postcentral gyri.  Clinical Impression  Pt admitted with/for L sided weakness.  MRI showing MCA CVA.  Pt needing min/mod assist for basic mobility and gait.[name]  Pt currently limited functionally due to the problems listed. ( See problems list.)   Pt will benefit from PT to maximize function and safety in order to get ready for next venue listed below.     Follow Up Recommendations SNF    Equipment Recommendations  Other (comment) (TBA next venue--may need new unilateral AD and/or 3 in 1)    Recommendations for Other Services       Precautions / Restrictions Precautions Precautions: Fall      Mobility  Bed Mobility               General bed mobility comments: NT, already in the chair  Transfers Overall transfer level: Needs assistance   Transfers: Sit to/from Stand Sit to Stand: Mod assist;Min assist (improved with practice)         General transfer comment: cues for hand/cane placement,  assist for stability and to help come forward more than boost.  Ambulation/Gait Ambulation/Gait assistance: Mod assist Ambulation Distance (Feet): 5 Feet (forward and back x3) Assistive device: Straight cane Gait Pattern/deviations: Step-to pattern;Step-through pattern   Gait velocity interpretation: Below normal speed for age/gender General Gait Details: work on toe off and translation l hip foward with swing  through and heel contact forward and then backward with stability assist.  Stairs            Wheelchair Mobility    Modified Rankin (Stroke Patients Only) Modified Rankin (Stroke Patients Only) Pre-Morbid Rankin Score: Slight disability Modified Rankin: Moderately severe disability     Balance Overall balance assessment: Needs assistance   Sitting balance-Leahy Scale: Fair       Standing balance-Leahy Scale: Poor Standing balance comment: needing AD or external support for safety                             Pertinent Vitals/Pain Pain Assessment: No/denies pain    Home Living Family/patient expects to be discharged to:: Private residence Living Arrangements: Alone Available Help at Discharge: Personal care attendant;Other (Comment) (2.5 hours/ 7 days/week) Type of Home: Apartment Home Access: Elevator       Home Equipment: Walker - 4 wheels;Cane - single point;Shower seat Additional Comments: may need more adjustable cane and 3 in1    Prior Function Level of Independence: Independent with assistive device(s)               Hand Dominance   Dominant Hand: Right    Extremity/Trunk Assessment   Upper Extremity Assessment Upper Extremity Assessment: Defer to OT evaluation    Lower Extremity Assessment Lower Extremity Assessment: LLE deficits/detail LLE Deficits / Details: movement mostly in synergy, hip flex 3/5, quads 4-, hams 3+, df 3, pf 3,  pt reports normal sensation, but not fully tested. LLE Coordination:  decreased fine motor       Communication   Communication: No difficulties  Cognition Arousal/Alertness: Awake/alert Behavior During Therapy: WFL for tasks assessed/performed Overall Cognitive Status: History of cognitive impairments - at baseline                                        General Comments      Exercises     Assessment/Plan    PT Assessment Patient needs continued PT services  PT Problem  List Decreased strength;Decreased activity tolerance;Decreased balance;Decreased mobility;Decreased coordination;Decreased knowledge of use of DME       PT Treatment Interventions DME instruction;Gait training;Functional mobility training;Therapeutic activities;Balance training;Patient/family education;Neuromuscular re-education    PT Goals (Current goals can be found in the Care Plan section)  Acute Rehab PT Goals Patient Stated Goal: back home eventually PT Goal Formulation: With patient Time For Goal Achievement: 08/01/16 Potential to Achieve Goals: Good    Frequency Min 3X/week   Barriers to discharge Decreased caregiver support alone in his apartment 21/5 hours per day.    Co-evaluation               AM-PAC PT "6 Clicks" Daily Activity  Outcome Measure Difficulty turning over in bed (including adjusting bedclothes, sheets and blankets)?: Total Difficulty moving from lying on back to sitting on the side of the bed? : Total Difficulty sitting down on and standing up from a chair with arms (e.g., wheelchair, bedside commode, etc,.)?: Total Help needed moving to and from a bed to chair (including a wheelchair)?: A Lot Help needed walking in hospital room?: A Lot Help needed climbing 3-5 steps with a railing? : A Lot 6 Click Score: 9    End of Session   Activity Tolerance: Patient tolerated treatment well Patient left: in chair;with call bell/phone within reach;with chair alarm set;with family/visitor present Nurse Communication: Mobility status PT Visit Diagnosis: Unsteadiness on feet (R26.81);Other abnormalities of gait and mobility (R26.89);Hemiplegia and hemiparesis Hemiplegia - Right/Left: Left Hemiplegia - dominant/non-dominant: Non-dominant Hemiplegia - caused by: Cerebral infarction    Time: 1140-1215 PT Time Calculation (min) (ACUTE ONLY): 35 min   Charges:   PT Evaluation $PT Eval Moderate Complexity: 1 Procedure PT Treatments $Gait Training: 8-22  mins   PT G Codes:        07-22-16  [name], PT [phone]  (pager)  [name] 22-Jul-2016, 12:36 PM

## 2016-07-18 NOTE — Consult Note (Signed)
Cardiology Consultation:   Patient ID: BLAINE HARI; [id number]; 05/16/1950   Admit date: 07/17/2016 Date of Consult: 07/18/2016  Primary Care Provider: Arnoldo Morale, MD Primary Cardiologist: none Primary Electrophysiologist:  none   Patient Profile:   [name] is a 66 y.o. male with a hx of recurrent strokes who is being seen today for the evaluation of AVWB and NSVT at the request of Hensel and staff.  History of Present Illness:   [name] is a 66 yo man with severe vascular disease including cerebral vascular disease. He has had multiple strokes. He has had mutlipel peripheral vascular as well as cerebral vascular interventions. He was on tele when he was noted to have AV block while sleeping. He has had no syncope. He has chronic dizzy spells. He has palpitations and he was noted to have NSVT also on tele. On echo less than a year ago, he had normal LV function.  He has claudication  Past Medical History:  Diagnosis Date  . Alcohol abuse    H/O  . Anxiety   . Arthritis   . Asthma   . CAD (coronary artery disease) 05/27/10   Cath: severe single vessell CAD left cx midportion obtuse marginal 2 to 3.  [name] CHF (congestive heart failure) ([place])   . COPD (chronic obstructive pulmonary disease) ([place])   . COPD (chronic obstructive pulmonary disease) ([place])   . Depression   . Diabetes mellitus, type 2 ([place]) 03/13/2015  . GERD (gastroesophageal reflux disease)   . HCAP (healthcare-associated pneumonia) 10/15/2015  . History of DVT of lower extremity   . Hyperlipemia   . Hyperlipidemia   . Hypertension   . Myocardial infarction ([place]) 2000  . Orbital fracture ([place]) 12/2012  . OSA on CPAP   . Peripheral vascular disease, unspecified ([place])    08/20/10 doppler: increase in right ABI post-op. Left ABI stable. S/P bi-fem bypass surgery  . Pneumonia 04/04/2012  . Shortness of breath   . Stroke ([place])   . Tobacco abuse     Past Surgical History:  Procedure Laterality  Date  . abdoninal ao angio & bifem angio  05/27/10   Patent graft, occluded bil stents with no retrograde flow into the hypogastric arteries. 100% occl left ant. tibial artery, 70% to 80% to 100% stenosis right superficial fem artery above adductor canal. 100 % occl right ant. tibial vessell  . Aortogram w/ PTCA  09/292003  . BACK SURGERY    . CARDIAC CATHETERIZATION  05/27/10   severe CAD left cx  . CHOLECYSTECTOMY    . ESOPHAGOGASTRIC FUNDOPLICATION    . EYE SURGERY    . FEMORAL BYPASS  08/19/10   Right Fem-Pop  . IR RADIOLOGIST EVAL & MGMT  05/27/2016  . JOINT REPLACEMENT    . PERIPHERAL VASCULAR CATHETERIZATION N/A 12/10/2014   Procedure: Abdominal Aortogram;  Surgeon: Angelia Mould, MD;  Location: [facility];  Service: Cardiovascular;  Laterality: N/A;  . RADIOLOGY WITH ANESTHESIA N/A 02/08/2015   Procedure: RADIOLOGY WITH ANESTHESIA;  Surgeon: Luanne Bras, MD;  Location: [facility];  Service: Radiology;  Laterality: N/A;  . TEE WITHOUT CARDIOVERSION N/A 08/29/2014   Procedure: TRANSESOPHAGEAL ECHOCARDIOGRAM (TEE);  Surgeon: Josue Hector, MD;  Location[facility];  Service: Cardiovascular;  Laterality: N/A;  . TOTAL KNEE ARTHROPLASTY       Inpatient Medications: Scheduled Meds: .  stroke: mapping our early stages of recovery book   Does not apply Once  . aspirin  300 mg Rectal Daily   Or  . aspirin  325 mg Oral Daily  . insulin aspart  0-5 Units Subcutaneous QHS  . insulin aspart  0-9 Units Subcutaneous TID WC  . nicotine  21 mg Transdermal Daily   Continuous Infusions:  PRN Meds: albuterol  Allergies:    Allergies  Allergen Reactions  . Darvocet [Propoxyphene N-Acetaminophen] Hives  . Haldol [Haloperidol Decanoate] Hives  . Acetaminophen Nausea Only    Upset stomach, tolerates Hydrocodone/APAP if taken with food  . Metformin And Related Nausea And Vomiting  . Norco [Hydrocodone-Acetaminophen] Nausea And Vomiting    Tolerates if taken with food     Social History:   Social History   Social History  . Marital status: Single    Spouse name: N/A  . Number of children: N/A  . Years of education: N/A   Occupational History  . Not on file.   Social History Main Topics  . Smoking status: Current Every Day Smoker    Packs/day: 1.00    Years: 50.00    Types: Cigars, Cigarettes    Start date: 02/09/1961  . Smokeless tobacco: Former Systems developer    Types: Chew     Comment: 2 ppd, full flavor  . Alcohol use No  . Drug use: No  . Sexual activity: Yes   Other Topics Concern  . Not on file   Social History Narrative   Recovering alcoholic    Family History:   The patient's family history includes Cancer in his brother; Cirrhosis in his father; Heart attack in his mother; Heart failure in his brother and mother. There is no history of Hypertension or Stroke.  ROS:  Please see the history of present illness.  ROS  All other ROS reviewed and negative.     Physical Exam/Data:   Vitals:   07/18/16 0032 07/18/16 0242 07/18/16 0600 07/18/16 1000  BP: (!) 160/79 (!) 161/77 (!) 175/83 (!) 181/82  Pulse:      Resp: 18 20 18 18   Temp: 98.2 F (36.8 C) 98.6 F (37 C) 98.2 F (36.8 C) 97.9 F (36.6 C)  TempSrc: Oral Oral Oral   SpO2: 95% 95% 97% 97%  Weight:      Height:        Intake/Output Summary (Last 24 hours) at 07/18/16 1245 Last data filed at 07/18/16 1032  Gross per 24 hour  Intake          1013.33 ml  Output                0 ml  Net          1013.33 ml   Filed Weights   07/17/16 1700 07/17/16 2120  Weight: 228 lb 13.4 oz (103.8 kg) 232 lb 4.8 oz (105.4 kg)   Body mass index is 31.51 kg/m.  General:  Well nourished, well developed, in no acute distress HEENT: normal Lymph: no adenopathy Neck: no JVD Endocrine:  No thryomegaly Vascular: No carotid bruits; FA pulses 2+ bilaterally without bruits  Cardiac:  normal S1, S2; RRR; no murmur  Lungs:  clear to auscultation bilaterally, no wheezing, rhonchi or rales   Abd: soft, nontender, no hepatomegaly  Ext: no edema Musculoskeletal:  No deformities, BUE and BLE strength normal and equal Skin: warm and dry  Neuro:  CNs 2-12 intact, with a L HP and some difficulty with speech Psych:  Pleasant affect   EKG:  The EKG was personally reviewed and demonstrates NSR with poor  R wave progression  Relevant CV Studies: none  Laboratory Data:  Chemistry Recent Labs Lab 07/17/16 1655 07/17/16 1710  NA 145 145  K 3.9 3.8  CL 110 107  CO2 27  --   GLUCOSE 87 86  BUN 12 13  CREATININE 0.96 1.00  CALCIUM 9.7  --   GFRNONAA >60  --   GFRAA >60  --   ANIONGAP 8  --      Recent Labs Lab 07/17/16 1655  PROT 6.4*  ALBUMIN 3.6  AST 16  ALT 12*  ALKPHOS 58  BILITOT 0.6   Hematology Recent Labs Lab 07/17/16 1655 07/17/16 1710  WBC 6.1  --   RBC 4.98  --   HGB 14.9 15.0  HCT 46.6 44.0  MCV 93.6  --   MCH 29.9  --   MCHC 32.0  --   RDW 14.1  --   PLT 147*  --    Cardiac EnzymesNo results for input(s): TROPONINI in the last 168 hours.  Recent Labs Lab 07/17/16 1708  TROPIPOC 0.00    BNPNo results for input(s): BNP, PROBNP in the last 168 hours.  DDimer No results for input(s): DDIMER in the last 168 hours.  Radiology/Studies:  Dg Chest 2 View  Result Date: 07/17/2016 CLINICAL DATA:  Chest pain.  Recent stroke. EXAM: CHEST  2 VIEW COMPARISON:  05/29/2016 and prior radiographs FINDINGS: Cardiomegaly again noted. There is no evidence of focal airspace disease, pulmonary edema, suspicious pulmonary nodule/mass, pleural effusion, or pneumothorax. No acute bony abnormalities are identified. IMPRESSION: Cardiomegaly without evidence of acute cardiopulmonary disease. Electronically Signed   By: Margarette Canada M.D.   On: 07/17/2016 18:36   Mr Brain Wo Contrast  Result Date: 07/18/2016 CLINICAL DATA:  Initial evaluation for left hemi paresis, slurred speech. History of prior proximal right ICA and supraclinoid stenting. EXAM: MRI HEAD WITHOUT  CONTRAST MRA HEAD WITHOUT CONTRAST TECHNIQUE: Multiplanar, multiecho pulse sequences of the brain and surrounding structures were obtained without intravenous contrast. Angiographic images of the head were obtained using MRA technique without contrast. COMPARISON:  Prior CT earlier the same day as well as previous MRI from 02/06/2015. FINDINGS: MRI HEAD FINDINGS Brain: Study moderately degraded by motion artifact. Generalized age related cerebral atrophy. Patchy and confluent T2/FLAIR hyperintensity within the periventricular and deep white matter both cerebral hemispheres most consistent with chronic small vessel ischemic disease. Overall, changes are moderate in nature. There is patchy predominantly cortical abnormal restricted diffusion involving the right frontoparietal region, with involvement of the pre and postcentral gyrus (series 9, image 46). Additional patchy cortical involvement more anteriorly within the right frontal lobe (series 9, image 43). Few small foci within the periventricular and subcortical white matter of the right frontal and parietal lobes (series 9, image 34). These are acute in nature with corresponding signal loss on ADC map. Probable trace petechial hemorrhage within a few areas of infarction at the posterior right frontal region (series 11, image 24, 23). Few more vague foci of diffusion abnormality without definite ADC correlate within the right frontal lobe (series 9, image 37), right periatrial white matter (series 9, image 25), and right parietal lobe (series 9, image 31) are suspected to be more subacute in nature. No other evidence for acute ischemia. Few scattered foci of chronic microhemorrhage at the parasagittal right parietal lobe and right occipital lobe noted, stable from prior. No mass lesion, midline shift or mass effect. No hydrocephalus. No extra-axial fluid collection. Major dural sinuses are grossly patent.  Incidental note made of an empty sella. Vascular: Chronic  abnormal flow void at the right carotid siphon, poorly visualized on this motion degraded exam. Major intracranial vascular flow voids otherwise maintained. Skull and upper cervical spine: Craniocervical junction normal. Visualized upper cervical spine unremarkable. Bone marrow signal intensity normal. No scalp soft tissue abnormality. Sinuses/Orbits: Globes and orbital soft tissues grossly unremarkable. Patient status post lens extraction bilaterally. Scattered mucosal thickening noted within ethmoidal air cells and left maxillary sinus. No air-fluid level to suggest active sinus infection. No mastoid effusion. MRA HEAD FINDINGS ANTERIOR CIRCULATION: Distal cervical segments of the internal carotid artery is are patent with antegrade flow. Right ICA diminutive as compared to the left. Petrous, cavernous, and supraclinoid left ICA patent without flow-limiting stenosis. Petrous right ICA patent. There is loss of signal flow void at the cavernous/ supraclinoid right ICA related to the right ICA stent. Distal reconstitution at the right ICA terminus which is patent. Left A1 segment dominant and widely patent. Right A1 segment hypoplastic but patent as well. Anterior communicating artery normal. Anterior cerebral arteries patent to their distal aspects. Left M1 segment, proximal M2 branches, and distal left MCA branches are widely patent. Patent flow through the right M1 segment without stenosis or occlusion. Flow is mildly attenuated as compared to the left. No proximal M2 occlusion on the right. Distal right MCA branches mildly attenuated as compared to the left. POSTERIOR CIRCULATION: Vertebral arteries widely patent to the vertebrobasilar junction. Posterior inferior cerebral arteries patent proximally. Basilar artery widely patent. Superior cerebral arteries and posterior cerebral arteries widely patent bilaterally without stenosis. No aneurysm or vascular malformation. IMPRESSION: MRI HEAD IMPRESSION: 1. Patchy  multifocal acute ischemic right MCA territory infarcts, predominantly cortical and posterior right frontal in location, with involvement of the right pre and postcentral gyri. Associated mild petechial hemorrhage without hemorrhagic transformation. Probable few scattered superimposed more subacute infarcts as above. 2. Moderate chronic microvascular ischemic disease. MRA HEAD IMPRESSION: 1. Absent flow void at the level of the right cavernous/supraclinoid stent with distal reconstitution at the right ICA terminus. Patent flow within the right MCA artery and its branches, although somewhat attenuated as compared to the contralateral left. Vascular patency through the stent could further evaluated with CTA as clinically desired. 2. Otherwise negative intracranial MRA. Electronically Signed   By: Jeannine Boga M.D.   On: 07/18/2016 02:01   Mr Jodene Nam Headm  Result Date: 07/18/2016 CLINICAL DATA:  Initial evaluation for left hemi paresis, slurred speech. History of prior proximal right ICA and supraclinoid stenting. EXAM: MRI HEAD WITHOUT CONTRAST MRA HEAD WITHOUT CONTRAST TECHNIQUE: Multiplanar, multiecho pulse sequences of the brain and surrounding structures were obtained without intravenous contrast. Angiographic images of the head were obtained using MRA technique without contrast. COMPARISON:  Prior CT earlier the same day as well as previous MRI from 02/06/2015. FINDINGS: MRI HEAD FINDINGS Brain: Study moderately degraded by motion artifact. Generalized age related cerebral atrophy. Patchy and confluent T2/FLAIR hyperintensity within the periventricular and deep white matter both cerebral hemispheres most consistent with chronic small vessel ischemic disease. Overall, changes are moderate in nature. There is patchy predominantly cortical abnormal restricted diffusion involving the right frontoparietal region, with involvement of the pre and postcentral gyrus (series 9, image 46). Additional patchy cortical  involvement more anteriorly within the right frontal lobe (series 9, image 43). Few small foci within the periventricular and subcortical white matter of the right frontal and parietal lobes (series 9, image 34). These are acute in nature with corresponding  signal loss on ADC map. Probable trace petechial hemorrhage within a few areas of infarction at the posterior right frontal region (series 11, image 24, 23). Few more vague foci of diffusion abnormality without definite ADC correlate within the right frontal lobe (series 9, image 37), right periatrial white matter (series 9, image 25), and right parietal lobe (series 9, image 31) are suspected to be more subacute in nature. No other evidence for acute ischemia. Few scattered foci of chronic microhemorrhage at the parasagittal right parietal lobe and right occipital lobe noted, stable from prior. No mass lesion, midline shift or mass effect. No hydrocephalus. No extra-axial fluid collection. Major dural sinuses are grossly patent. Incidental note made of an empty sella. Vascular: Chronic abnormal flow void at the right carotid siphon, poorly visualized on this motion degraded exam. Major intracranial vascular flow voids otherwise maintained. Skull and upper cervical spine: Craniocervical junction normal. Visualized upper cervical spine unremarkable. Bone marrow signal intensity normal. No scalp soft tissue abnormality. Sinuses/Orbits: Globes and orbital soft tissues grossly unremarkable. Patient status post lens extraction bilaterally. Scattered mucosal thickening noted within ethmoidal air cells and left maxillary sinus. No air-fluid level to suggest active sinus infection. No mastoid effusion. MRA HEAD FINDINGS ANTERIOR CIRCULATION: Distal cervical segments of the internal carotid artery is are patent with antegrade flow. Right ICA diminutive as compared to the left. Petrous, cavernous, and supraclinoid left ICA patent without flow-limiting stenosis. Petrous  right ICA patent. There is loss of signal flow void at the cavernous/ supraclinoid right ICA related to the right ICA stent. Distal reconstitution at the right ICA terminus which is patent. Left A1 segment dominant and widely patent. Right A1 segment hypoplastic but patent as well. Anterior communicating artery normal. Anterior cerebral arteries patent to their distal aspects. Left M1 segment, proximal M2 branches, and distal left MCA branches are widely patent. Patent flow through the right M1 segment without stenosis or occlusion. Flow is mildly attenuated as compared to the left. No proximal M2 occlusion on the right. Distal right MCA branches mildly attenuated as compared to the left. POSTERIOR CIRCULATION: Vertebral arteries widely patent to the vertebrobasilar junction. Posterior inferior cerebral arteries patent proximally. Basilar artery widely patent. Superior cerebral arteries and posterior cerebral arteries widely patent bilaterally without stenosis. No aneurysm or vascular malformation. IMPRESSION: MRI HEAD IMPRESSION: 1. Patchy multifocal acute ischemic right MCA territory infarcts, predominantly cortical and posterior right frontal in location, with involvement of the right pre and postcentral gyri. Associated mild petechial hemorrhage without hemorrhagic transformation. Probable few scattered superimposed more subacute infarcts as above. 2. Moderate chronic microvascular ischemic disease. MRA HEAD IMPRESSION: 1. Absent flow void at the level of the right cavernous/supraclinoid stent with distal reconstitution at the right ICA terminus. Patent flow within the right MCA artery and its branches, although somewhat attenuated as compared to the contralateral left. Vascular patency through the stent could further evaluated with CTA as clinically desired. 2. Otherwise negative intracranial MRA. Electronically Signed   By: Jeannine Boga M.D.   On: 07/18/2016 02:01   Ct Head Code Stroke W/o  Cm  Result Date: 07/17/2016 CLINICAL DATA:  Code stroke. Right-sided facial droop beginning 1 hour ago. Left-sided weakness beginning 6 days ago. EXAM: CT HEAD WITHOUT CONTRAST TECHNIQUE: Contiguous axial images were obtained from the base of the skull through the vertex without intravenous contrast. COMPARISON:  CT head without contrast 05/29/2016. FINDINGS: Brain: An acute/subacute posterior right frontal lobe nonhemorrhagic infarct is more prominent than on the prior exam. No  acute or subacute left-sided infarct is present. The basal ganglia are intact bilaterally. The insular ribbon is normal. No acute hemorrhage or mass lesion is present. The ventricles are of normal size. No significant extra-axial fluid collection is present. Vascular: A cavernous right internal carotid artery stent is in place. There is no hyperdense vessel. Atherosclerotic changes are noted bilaterally. Skull: The calvarium is intact. No focal lytic or blastic lesions are present. Sinuses/Orbits: The left maxillary sinus roof and orbital floor is status post ORIF. Left maxillary sinus is chronically shrunken. The remaining paranasal sinuses and the mastoid air cells are clear bilaterally. Rightward nasal septal deviation is present. Bilateral lens replacements are present. The globes and orbits are within normal limits otherwise. ASPECTS [facility] Stroke Program Early CT Score) - Ganglionic level infarction (caudate, lentiform nuclei, internal capsule, insula, M1-M3 cortex): 7/7 - Supraganglionic infarction (M4-M6 cortex): 2/3 Total score (0-10 with 10 being normal): 9/10 IMPRESSION: 1. Posterior right frontal lobe acute/subacute infarct is more prominent than on the study 2 months ago. This may reflect evolution of the previous infarct or a a progressive new infarct. 2. No other acute infarct. 3. ASPECTS is 9/10 Electronically Signed   By: San Morelle M.D.   On: 07/17/2016 17:37    Assessment and Plan:   1. AV block - his  telemetry demonstrates these episodes occurring in the early morning hours when he is asleep. I would recommend watchful waiting and avoidance of AV nodal blocking drugs. 2. NSVT - I am sure he has CAD but his LV function is normal. In absence of symptoms, no treatment. 3. Stroke - He has lots of cerebral vascular disease. I do worry about occult PAF. Would not anti-coagulate at this point. 4. Dyslipidemia - he will need aggressive statin therapy 5. Tobacco abuse - he is still smoking. I discussed the importance of smoking cessation.   Signed, Cristopher Peru, MD  07/18/2016 12:45 PM

## 2016-07-18 NOTE — Evaluation (Addendum)
Occupational Therapy Evaluation Patient Details Name: [name] MRN: [id number] DOB: Jun 24, 1950 Today's Date: 07/18/2016    History of Present Illness [name] is a 66 y.o. male presenting with left hemiparesis and slurred speech . PMH is significant for ?PAF (no anticoag), HTN, CAD, dCHF, substance use, history of DVT, gout, DM, carotid stenosis s/p stents, COPD, depression and dementia.  MRI showed Patchy multifocal acute ischemic right MCA territory infarcts, predominantly cortical and posterior right frontal in location, with involvement of the right pre and postcentral gyri.   Clinical Impression   Pt admitted with the above diagnoses and presents with below problem list. Pt will benefit from continued acute OT to address the below listed deficits and maximize independence with basic ADLs prior to d/c to venue below. PTA pt was living alone and was mod I with ADLs (h/o falls). Pt presents with grossly flaccid LUE and with some LLE weakness impacting assist level with ADLs. Pt currently mod-max A with ADLs and functional transfers. Of note, pt is reporting pain/tightness at L shoulder level when in unsupported seated position and with PROM L shoulder flexion. Educated on positioning of LUE and PROM/AAROM exercises to toleration. Does have some AROM at L elbow level, improved performance with this in supine. Session details below.       Follow Up Recommendations  SNF    Equipment Recommendations  Other (comment) (defer to next venue)    Recommendations for Other Services       Precautions / Restrictions Precautions Precautions: Fall;Other (comment) (LUE flaccidity) Required Braces or Orthoses:  (Pt reports MD has ordered a sling to support LUE during OOB) Restrictions Weight Bearing Restrictions: No      Mobility Bed Mobility Overal bed mobility: Needs Assistance Bed Mobility: Supine to Sit     Supine to sit: Mod assist;HOB elevated     General bed mobility  comments: Assist to support LUE and powerup to EOB.   Transfers Overall transfer level: Needs assistance Equipment used: Straight cane Transfers: Sit to/from [facility] Sit to Stand: Mod assist;Min assist Stand pivot transfers: Mod assist       General transfer comment: steadying assist and support of LUE    Balance Overall balance assessment: Needs assistance   Sitting balance-Leahy Scale: Fair       Standing balance-Leahy Scale: Poor Standing balance comment: needing AD or external support for safety                           ADL either performed or assessed with clinical judgement   ADL Overall ADL's : Needs assistance/impaired Eating/Feeding: Sitting;Minimal assistance;Set up Eating/Feeding Details (indicate cue type and reason): assist for bilateral tasks.  Grooming: Sitting;Maximal assistance Grooming Details (indicate cue type and reason): assist for bilateral tasks.  Upper Body Bathing: Sitting;Maximal assistance   Lower Body Bathing: Moderate assistance;Sit to/from stand   Upper Body Dressing : Maximal assistance;Sitting   Lower Body Dressing: Moderate assistance;Sit to/from stand   Toilet Transfer: Moderate assistance;Stand-pivot;BSC (cane)   Toileting- Clothing Manipulation and Hygiene: Sit to/from stand;Maximal assistance (cane )   Tub/ Shower Transfer: Moderate assistance;Stand-pivot;3 in 1 (cane)     General ADL Comments: Pt completed bed mobility and SPT EOB>recliner. Educated on LUE positioning and PROM/AAROM exercises to toleration. Also discussed safety with LUE.     Vision       Perception Perception Perception Tested?: Yes Comments: possible L side neglect, need to assess further  Praxis      Pertinent Vitals/Pain Pain Assessment: Faces Faces Pain Scale: Hurts little more Pain Location: L shoulder with PROM at ~75* Pain Descriptors / Indicators: Tightness Pain Intervention(s): Limited activity within  patient's tolerance;Monitored during session;Repositioned     Hand Dominance Right   Extremity/Trunk Assessment Upper Extremity Assessment Upper Extremity Assessment: LUE deficits/detail LUE Deficits / Details: grossly flaccid but able to do some L elbow flexion/extension in supine. Reports sensation is intact. Some pain/tightness at shoulder level with shoulder flexion PROM and in unsupported sitting.  LUE: Unable to fully assess due to pain LUE Coordination: decreased fine motor;decreased gross motor   Lower Extremity Assessment Lower Extremity Assessment: Defer to PT evaluation       Communication Communication Communication: No difficulties   Cognition Arousal/Alertness: Awake/alert Behavior During Therapy: WFL for tasks assessed/performed Overall Cognitive Status: History of cognitive impairments - at baseline                                     General Comments       Exercises     Shoulder Instructions      Home Living Family/patient expects to be discharged to:: Skilled nursing facility Living Arrangements: Alone Available Help at Discharge: Personal care attendant;Other (Comment) (2.5 hours/ 7 days/week) Type of Home: Apartment Home Access: Elevator           Bathroom Shower/Tub: Teacher, early years/pre: Handicapped height Bathroom Accessibility: Yes   Home Equipment: Environmental consultant - 4 wheels;Cane - single point;Shower seat   Additional Comments: may need more adjustable cane and 3 in1  Lives With: Alone    Prior Functioning/Environment Level of Independence: Independent with assistive device(s)        Comments: h/o falls        OT Problem List: Impaired balance (sitting and/or standing);Decreased cognition;Decreased knowledge of use of DME or AE;Decreased knowledge of precautions;Impaired UE functional use;Pain;Impaired tone;Decreased strength;Decreased range of motion      OT Treatment/Interventions: Self-care/ADL  training;Therapeutic exercise;Neuromuscular education;DME and/or AE instruction;Therapeutic activities;Patient/family education;Balance training    OT Goals(Current goals can be found in the care plan section) Acute Rehab OT Goals Patient Stated Goal: back home eventually; use left arm functionally. OT Goal Formulation: With patient Time For Goal Achievement: 08/01/16 Potential to Achieve Goals: Good ADL Goals Pt Will Perform Eating: sitting;with modified independence Pt Will Perform Grooming: sitting;with mod assist Pt Will Perform Upper Body Bathing: with mod assist;sitting Pt Will Perform Lower Body Bathing: sit to/from stand;with min assist Pt Will Perform Upper Body Dressing: with mod assist;sitting Pt Will Perform Lower Body Dressing: with min assist;sit to/from stand Pt Will Transfer to Toilet: with mod assist;ambulating;bedside commode Pt Will Perform Toileting - Clothing Manipulation and hygiene: with min assist;sit to/from stand Pt/caregiver will Perform Home Exercise Program: Increased ROM;Increased strength;Left upper extremity;With minimal assist Additional ADL Goal #1: Pt will be independent with positioning of LUE.   OT Frequency: Min 2X/week   Barriers to D/C:            Co-evaluation              AM-PAC PT "6 Clicks" Daily Activity     Outcome Measure Help from another person eating meals?: A Lot Help from another person taking care of personal grooming?: A Lot Help from another person toileting, which includes using toliet, bedpan, or urinal?: A Lot Help from another person bathing (  including washing, rinsing, drying)?: A Lot Help from another person to put on and taking off regular upper body clothing?: A Lot Help from another person to put on and taking off regular lower body clothing?: A Lot 6 Click Score: 12   End of Session Equipment Utilized During Treatment: Gait belt (cane) Nurse Communication: Other (comment);Mobility status (LUE  positioning)  Activity Tolerance: Patient tolerated treatment well Patient left: in chair;with call bell/phone within reach;with chair alarm set  OT Visit Diagnosis: Unsteadiness on feet (R26.81);History of falling (Z91.81);Hemiplegia and hemiparesis;Pain;Other symptoms and signs involving cognitive function Hemiplegia - Right/Left: Left Hemiplegia - dominant/non-dominant: Non-Dominant Hemiplegia - caused by: Cerebral infarction Pain - Right/Left: Left Pain - part of body: Shoulder                Time: 1547-1620 OT Time Calculation (min): 33 min Charges:  OT General Charges $OT Visit: 1 Procedure OT Evaluation $OT Eval Moderate Complexity: 1 Procedure OT Treatments $Self Care/Home Management : 8-22 mins G-Codes:       Hortencia Pilar 07/18/2016, 4:50 PM

## 2016-07-18 NOTE — Progress Notes (Signed)
Family Medicine Teaching Service Daily Progress Note Intern Pager: [phone]  Patient name: [name] Medical record number: [id number] Date of birth: Sep 09, 1950 Age: 66 y.o. Gender: male  Primary Care Provider: Arnoldo Morale, MD Consultants: neurology Code Status: DNR  Pt Overview and Major Events to Date:  07/17/2016  Assessment and Plan: [name] is a 66 y.o. male presenting with left hemiparesis and slurred speech . PMH is significant for ?PAF (no anticoag), HTN, CAD, dCHF, substance use, history of DVT, gout, DM, carotid stenosis s/p stents, COPD, depression and dementia  CVA in Rt MCA territory: Multiple episodes of this in the past. Patient with cervical and intracranial R ICA s/p stent. CT head and MRI brain with acute/subacute infarct in right MCA territory. Exam consistent with imaging. UDS was cocaine. Ethanol level negative. Today, left hemiplegia improved. Motor with 3/5 in LUE & 4+/5 in LLE. Light sensation intact today.  -Appreciate neurology recommendations             -Carotid doppler -Echo -Risk stratification labs (lipid panel, A1c and TSH)             -Permissive hypertension             -Tight CBG control             -continue aspirin -Atorvastatin after SLP -Neurovascular check q2hrs -Cardiac monitor -PT/OT/SLP -NPO pending SLP eval -NS (No dextrose containing fluid) -Fall precaution -SCD  PAF(no anticoag): on his problem list but no detailed info on this. [place] Vasc score 7. Not on any anticoagulation except aspirin. Had second degree AV block overnight. -Need to be readdressed in the morning  2nd degree AVB-II: noted on tele overnight. Patient is on carvedilol at home. Last dose two days ago.  -Card consult  HTN: Hypertensive to 170s/80s . He is on carvedilol, Lasix, hydralazine & lisinopril. Last dose on his medications was yesterday. -Permissive hypertension per neurology recommendations -We'll avoid beta  blocker for AV block  CAD: Had LHC on 05/27/2010 that showed severe CAD and left circumflex artery. I could not find the formal reading of this in his chart. Patient without acute chest pain or dyspnea at this time. Initial EKG and troponin are negative. He is on Coreg, aspirin, Plavix and atorvastatin at home -We will avoid beta blocker due to AV block -We'll continue aspirin -We will resume Plavix and atorvastatin after SLP  dCHF: Echo on 10/16/2015 with EF of 55-60%, akinesis of the basal inferior myocardium, G1DD. He has no cardiopulmonary symptoms. Exam with 1+ pitting edema bilaterally. CXR without evidence of acute cardiopulmonary disease. He is on Lasix at home -Echocardiogram as above -Hold home meds for permissive hypertension  Urinary incontinence: resolved. No other urinary symptoms.  Not sure if this is related to his stroke. He has no bowel issues. UA negative.  -We will monitor urine output  Substance use: UDS positive for cocaine -Discussed about the consequence of this and advised to stop  Tobacco use disorder: Smokes about a pack a day. Discussed about the consequence of this on his health. Recommended quiting -Nicotine patch  DM:  CBG in 90s. Last A1c 5.7 on 04/28/2016. Seems to be taken off medications.  -SSI-thin -CBG before meals & HS -We will resume his home gabapentin when able  COPD/OSA: on albuterol at home. He continues to smoke about 5-6 cigarettes a day. He has no respiratory symptoms. Doesn't use CPAP at home.  -We will continue albuterol as needed  Depression/?dementia: Stable. On  Prozac and Namenda at home. -We'll resume his home Prozac when able -We'll hold Namenda  Gout: Stable -Resume, allopurinol when able  Left shoulder pain: he is tender to palpation over his left shoulder anteriorly. Full passive range of motion without pain.  -Will try K-pad. Pain is minimal  FEN/GI:  -NPO pending SLP -Continue IVF@100   Prophylaxis:   -SCD  Disposition: Continue MedSurg for further evaluation and management of stroke  Subjective:  No complaint this morning. He says his left arm weakness has improved. No urinary symptoms. Denies chest pain, palpitation and shortness of breath.   Objective: Temp:  [97.8 F (36.6 C)-98.6 F (37 C)] 98.2 F (36.8 C) (06/09 0600) Pulse Rate:  [67-79] 74 (06/08 2120) Resp:  [15-27] 18 (06/09 0600) BP: (148-185)/(62-88) 175/83 (06/09 0600) SpO2:  [95 %-99 %] 97 % (06/09 0600) Weight:  [228 lb 13.4 oz (103.8 kg)-232 lb 4.8 oz (105.4 kg)] 232 lb 4.8 oz (105.4 kg) (06/08 2120) Physical Exam: GEN: appears well, no apparent distress. CVS: RRR, nl s1 & s2, no murmurs, 1+ edema bilaterally RESP: speaks in full sentence, no IWOB, good air movement bilaterally, CTAB GI: BS present & normal, soft, NTND GU: no suprapubic or CVA tenderness MSK: no notable swelling, mild tenderness to palpation over his left shoulder anteriorly. But no  SKIN: no apparent skin lesion NEURO: Awake, alert and oriented 4, cranial nerve was intact except for CN XI deficit on the left, motor 3/5 in LUE, 4+/5 in LLE, mildly diminished light sensation in the left upper extremities, patellar reflex 2+ on the left & 1+ on the right (history of right knee replacement), finger-to-nose intact on the right, couldn't perform on the left due to weakness.  PSYCH: euthymic mood with congruent affect  Laboratory:  Recent Labs Lab 07/17/16 1655 07/17/16 1710  WBC 6.1  --   HGB 14.9 15.0  HCT 46.6 44.0  PLT 147*  --     Recent Labs Lab 07/17/16 1655 07/17/16 1710  NA 145 145  K 3.9 3.8  CL 110 107  CO2 27  --   BUN 12 13  CREATININE 0.96 1.00  CALCIUM 9.7  --   PROT 6.4*  --   BILITOT 0.6  --   ALKPHOS 58  --   ALT 12*  --   AST 16  --   GLUCOSE 87 86    Imaging/Diagnostic Tests: Dg Chest 2 View  Result Date: 07/17/2016 CLINICAL DATA:  Chest pain.  Recent stroke. EXAM: CHEST  2 VIEW COMPARISON:  05/29/2016  and prior radiographs FINDINGS: Cardiomegaly again noted. There is no evidence of focal airspace disease, pulmonary edema, suspicious pulmonary nodule/mass, pleural effusion, or pneumothorax. No acute bony abnormalities are identified. IMPRESSION: Cardiomegaly without evidence of acute cardiopulmonary disease. Electronically Signed   By: Margarette Canada M.D.   On: 07/17/2016 18:36   Mr Brain Wo Contrast  Result Date: 07/18/2016 CLINICAL DATA:  Initial evaluation for left hemi paresis, slurred speech. History of prior proximal right ICA and supraclinoid stenting. EXAM: MRI HEAD WITHOUT CONTRAST MRA HEAD WITHOUT CONTRAST TECHNIQUE: Multiplanar, multiecho pulse sequences of the brain and surrounding structures were obtained without intravenous contrast. Angiographic images of the head were obtained using MRA technique without contrast. COMPARISON:  Prior CT earlier the same day as well as previous MRI from 02/06/2015. FINDINGS: MRI HEAD FINDINGS Brain: Study moderately degraded by motion artifact. Generalized age related cerebral atrophy. Patchy and confluent T2/FLAIR hyperintensity within the periventricular and deep white matter both  cerebral hemispheres most consistent with chronic small vessel ischemic disease. Overall, changes are moderate in nature. There is patchy predominantly cortical abnormal restricted diffusion involving the right frontoparietal region, with involvement of the pre and postcentral gyrus (series 9, image 46). Additional patchy cortical involvement more anteriorly within the right frontal lobe (series 9, image 43). Few small foci within the periventricular and subcortical white matter of the right frontal and parietal lobes (series 9, image 34). These are acute in nature with corresponding signal loss on ADC map. Probable trace petechial hemorrhage within a few areas of infarction at the posterior right frontal region (series 11, image 24, 23). Few more vague foci of diffusion abnormality  without definite ADC correlate within the right frontal lobe (series 9, image 37), right periatrial white matter (series 9, image 25), and right parietal lobe (series 9, image 31) are suspected to be more subacute in nature. No other evidence for acute ischemia. Few scattered foci of chronic microhemorrhage at the parasagittal right parietal lobe and right occipital lobe noted, stable from prior. No mass lesion, midline shift or mass effect. No hydrocephalus. No extra-axial fluid collection. Major dural sinuses are grossly patent. Incidental note made of an empty sella. Vascular: Chronic abnormal flow void at the right carotid siphon, poorly visualized on this motion degraded exam. Major intracranial vascular flow voids otherwise maintained. Skull and upper cervical spine: Craniocervical junction normal. Visualized upper cervical spine unremarkable. Bone marrow signal intensity normal. No scalp soft tissue abnormality. Sinuses/Orbits: Globes and orbital soft tissues grossly unremarkable. Patient status post lens extraction bilaterally. Scattered mucosal thickening noted within ethmoidal air cells and left maxillary sinus. No air-fluid level to suggest active sinus infection. No mastoid effusion. MRA HEAD FINDINGS ANTERIOR CIRCULATION: Distal cervical segments of the internal carotid artery is are patent with antegrade flow. Right ICA diminutive as compared to the left. Petrous, cavernous, and supraclinoid left ICA patent without flow-limiting stenosis. Petrous right ICA patent. There is loss of signal flow void at the cavernous/ supraclinoid right ICA related to the right ICA stent. Distal reconstitution at the right ICA terminus which is patent. Left A1 segment dominant and widely patent. Right A1 segment hypoplastic but patent as well. Anterior communicating artery normal. Anterior cerebral arteries patent to their distal aspects. Left M1 segment, proximal M2 branches, and distal left MCA branches are widely  patent. Patent flow through the right M1 segment without stenosis or occlusion. Flow is mildly attenuated as compared to the left. No proximal M2 occlusion on the right. Distal right MCA branches mildly attenuated as compared to the left. POSTERIOR CIRCULATION: Vertebral arteries widely patent to the vertebrobasilar junction. Posterior inferior cerebral arteries patent proximally. Basilar artery widely patent. Superior cerebral arteries and posterior cerebral arteries widely patent bilaterally without stenosis. No aneurysm or vascular malformation. IMPRESSION: MRI HEAD IMPRESSION: 1. Patchy multifocal acute ischemic right MCA territory infarcts, predominantly cortical and posterior right frontal in location, with involvement of the right pre and postcentral gyri. Associated mild petechial hemorrhage without hemorrhagic transformation. Probable few scattered superimposed more subacute infarcts as above. 2. Moderate chronic microvascular ischemic disease. MRA HEAD IMPRESSION: 1. Absent flow void at the level of the right cavernous/supraclinoid stent with distal reconstitution at the right ICA terminus. Patent flow within the right MCA artery and its branches, although somewhat attenuated as compared to the contralateral left. Vascular patency through the stent could further evaluated with CTA as clinically desired. 2. Otherwise negative intracranial MRA. Electronically Signed   By: Pincus Badder.D.  On: 07/18/2016 02:01   Mr Jodene Nam Headm  Result Date: 07/18/2016 CLINICAL DATA:  Initial evaluation for left hemi paresis, slurred speech. History of prior proximal right ICA and supraclinoid stenting. EXAM: MRI HEAD WITHOUT CONTRAST MRA HEAD WITHOUT CONTRAST TECHNIQUE: Multiplanar, multiecho pulse sequences of the brain and surrounding structures were obtained without intravenous contrast. Angiographic images of the head were obtained using MRA technique without contrast. COMPARISON:  Prior CT earlier the same  day as well as previous MRI from 02/06/2015. FINDINGS: MRI HEAD FINDINGS Brain: Study moderately degraded by motion artifact. Generalized age related cerebral atrophy. Patchy and confluent T2/FLAIR hyperintensity within the periventricular and deep white matter both cerebral hemispheres most consistent with chronic small vessel ischemic disease. Overall, changes are moderate in nature. There is patchy predominantly cortical abnormal restricted diffusion involving the right frontoparietal region, with involvement of the pre and postcentral gyrus (series 9, image 46). Additional patchy cortical involvement more anteriorly within the right frontal lobe (series 9, image 43). Few small foci within the periventricular and subcortical white matter of the right frontal and parietal lobes (series 9, image 34). These are acute in nature with corresponding signal loss on ADC map. Probable trace petechial hemorrhage within a few areas of infarction at the posterior right frontal region (series 11, image 24, 23). Few more vague foci of diffusion abnormality without definite ADC correlate within the right frontal lobe (series 9, image 37), right periatrial white matter (series 9, image 25), and right parietal lobe (series 9, image 31) are suspected to be more subacute in nature. No other evidence for acute ischemia. Few scattered foci of chronic microhemorrhage at the parasagittal right parietal lobe and right occipital lobe noted, stable from prior. No mass lesion, midline shift or mass effect. No hydrocephalus. No extra-axial fluid collection. Major dural sinuses are grossly patent. Incidental note made of an empty sella. Vascular: Chronic abnormal flow void at the right carotid siphon, poorly visualized on this motion degraded exam. Major intracranial vascular flow voids otherwise maintained. Skull and upper cervical spine: Craniocervical junction normal. Visualized upper cervical spine unremarkable. Bone marrow signal  intensity normal. No scalp soft tissue abnormality. Sinuses/Orbits: Globes and orbital soft tissues grossly unremarkable. Patient status post lens extraction bilaterally. Scattered mucosal thickening noted within ethmoidal air cells and left maxillary sinus. No air-fluid level to suggest active sinus infection. No mastoid effusion. MRA HEAD FINDINGS ANTERIOR CIRCULATION: Distal cervical segments of the internal carotid artery is are patent with antegrade flow. Right ICA diminutive as compared to the left. Petrous, cavernous, and supraclinoid left ICA patent without flow-limiting stenosis. Petrous right ICA patent. There is loss of signal flow void at the cavernous/ supraclinoid right ICA related to the right ICA stent. Distal reconstitution at the right ICA terminus which is patent. Left A1 segment dominant and widely patent. Right A1 segment hypoplastic but patent as well. Anterior communicating artery normal. Anterior cerebral arteries patent to their distal aspects. Left M1 segment, proximal M2 branches, and distal left MCA branches are widely patent. Patent flow through the right M1 segment without stenosis or occlusion. Flow is mildly attenuated as compared to the left. No proximal M2 occlusion on the right. Distal right MCA branches mildly attenuated as compared to the left. POSTERIOR CIRCULATION: Vertebral arteries widely patent to the vertebrobasilar junction. Posterior inferior cerebral arteries patent proximally. Basilar artery widely patent. Superior cerebral arteries and posterior cerebral arteries widely patent bilaterally without stenosis. No aneurysm or vascular malformation. IMPRESSION: MRI HEAD IMPRESSION: 1. Patchy multifocal acute  ischemic right MCA territory infarcts, predominantly cortical and posterior right frontal in location, with involvement of the right pre and postcentral gyri. Associated mild petechial hemorrhage without hemorrhagic transformation. Probable few scattered superimposed  more subacute infarcts as above. 2. Moderate chronic microvascular ischemic disease. MRA HEAD IMPRESSION: 1. Absent flow void at the level of the right cavernous/supraclinoid stent with distal reconstitution at the right ICA terminus. Patent flow within the right MCA artery and its branches, although somewhat attenuated as compared to the contralateral left. Vascular patency through the stent could further evaluated with CTA as clinically desired. 2. Otherwise negative intracranial MRA. Electronically Signed   By: Jeannine Boga M.D.   On: 07/18/2016 02:01   Ct Head Code Stroke W/o Cm  Result Date: 07/17/2016 CLINICAL DATA:  Code stroke. Right-sided facial droop beginning 1 hour ago. Left-sided weakness beginning 6 days ago. EXAM: CT HEAD WITHOUT CONTRAST TECHNIQUE: Contiguous axial images were obtained from the base of the skull through the vertex without intravenous contrast. COMPARISON:  CT head without contrast 05/29/2016. FINDINGS: Brain: An acute/subacute posterior right frontal lobe nonhemorrhagic infarct is more prominent than on the prior exam. No acute or subacute left-sided infarct is present. The basal ganglia are intact bilaterally. The insular ribbon is normal. No acute hemorrhage or mass lesion is present. The ventricles are of normal size. No significant extra-axial fluid collection is present. Vascular: A cavernous right internal carotid artery stent is in place. There is no hyperdense vessel. Atherosclerotic changes are noted bilaterally. Skull: The calvarium is intact. No focal lytic or blastic lesions are present. Sinuses/Orbits: The left maxillary sinus roof and orbital floor is status post ORIF. Left maxillary sinus is chronically shrunken. The remaining paranasal sinuses and the mastoid air cells are clear bilaterally. Rightward nasal septal deviation is present. Bilateral lens replacements are present. The globes and orbits are within normal limits otherwise. ASPECTS [facility] Stroke  Program Early CT Score) - Ganglionic level infarction (caudate, lentiform nuclei, internal capsule, insula, M1-M3 cortex): 7/7 - Supraganglionic infarction (M4-M6 cortex): 2/3 Total score (0-10 with 10 being normal): 9/10 IMPRESSION: 1. Posterior right frontal lobe acute/subacute infarct is more prominent than on the study 2 months ago. This may reflect evolution of the previous infarct or a a progressive new infarct. 2. No other acute infarct. 3. ASPECTS is 9/10 Electronically Signed   By: San Morelle M.D.   On: 07/17/2016 17:37    Mercy Riding, MD 07/18/2016, 7:14 AM PGY-2, [place] Intern pager: [phone], text pages welcome

## 2016-07-18 NOTE — Evaluation (Signed)
Speech Language Pathology Evaluation Patient Details Name: [name] MRN: [id number] DOB: [birth year] Today's Date: 07/18/2016 Time: 4431-5400 SLP Time Calculation (min) (ACUTE ONLY): 19 min  Problem List:  Patient Active Problem List   Diagnosis Date Noted  . Stroke-like symptoms 07/17/2016  . Insomnia 01/29/2016  . Depression 01/29/2016  . Alcohol abuse with intoxication ([place]) 10/16/2015  . Psoriasiform dermatitis 07/12/2015  . Dementia due to another medical condition 07/12/2015  . Urinary incontinence 03/14/2015  . Diabetes mellitus, type 2 ([place]) 03/13/2015  . Hemiparesis affecting left side as late effect of cerebrovascular accident ([place]) 02/18/2015  . Cerebrovascular accident (CVA) due to stenosis of right carotid artery ([place])   . Coronary artery disease involving native coronary artery of native heart without angina pectoris   . Tachypnea   . Thrombocytopenia ([place])   . History of stroke   . Stroke (cerebrum) ([place]) 02/06/2015  . Arterial ischemic stroke, MCA (middle cerebral artery), right, acute ([place])   . DOE (dyspnea on exertion) 11/23/2014  . PAF (paroxysmal atrial fibrillation) ([place]) 11/23/2014  . HLD (hyperlipidemia) 11/01/2014  . Tobacco use disorder 11/01/2014  . Diastolic CHF, acute on chronic (HCC) 11/01/2014  . Cerebral infarction due to stenosis of right carotid artery ([place]) 11/01/2014  . Carotid stenosis 11/01/2014  . Hyperkalemia 10/24/2014  . H/O total knee replacement 10/24/2014  . Essential hypertension   . Polysubstance abuse 08/28/2014  . Cocaine abuse   . Intracranial carotid stenosis   . Headache around the eyes 08/27/2014  . Acute CVA (cerebrovascular accident) ([place]) 08/27/2014  . CVA (cerebral vascular accident) ([place]) 08/26/2014  . History of DVT (deep vein thrombosis)   . Alcoholism ([place])   . Embolic stroke ([place])   . Cerebral infarction due to unspecified mechanism   . Hypotension   . AKI (acute kidney injury) ([place])   . Stroke ([place])    . CVA (cerebral infarction) 08/01/2014  . Alcohol dependence with withdrawal with complication ([place]) [birth year]1950  . DVT (deep venous thrombosis) ([place]) 02/22/2014  . Lower extremity edema 02/21/2014  . Chronic diastolic congestive heart failure ([place]) 11/21/2013  . CAD (coronary artery disease) 11/21/2013  . Alcohol abuse 11/21/2013  . GERD (gastroesophageal reflux disease) 08/10/2013  . Gout 08/10/2013  . Tobacco abuse 07/26/2013  . Trichiasis without entropion 04/16/2013  . Dizziness 04/10/2013  . Eyelid lesion 01/24/2013  . Enophthalmos due to trauma 01/19/2013  . Medial orbital wall fracture (HCC) 01/19/2013  . Closed blow-out fracture of floor of orbit ([place]) 01/19/2013  . Binocular vision disorder with diplopia 01/03/2013  . Fracture of inferior orbital wall ([place]) 01/03/2013  . Fracture of orbital floor ([place]) 01/03/2013  . Pain of left eye 01/03/2013  . Peripheral vascular disease ([place]) 07/27/2012  . COPD (chronic obstructive pulmonary disease) ([place]) 04/04/2012  . HTN (hypertension) 04/04/2012   Past Medical History:  Past Medical History:  Diagnosis Date  . Alcohol abuse    H/O  . Anxiety   . Arthritis   . Asthma   . CAD (coronary artery disease) 05/27/10   Cath: severe single vessell CAD left cx midportion obtuse marginal 2 to 3.  [name] CHF (congestive heart failure) ([place])   . COPD (chronic obstructive pulmonary disease) ([place])   . COPD (chronic obstructive pulmonary disease) ([place])   . Depression   . Diabetes mellitus, type 2 ([place]) 03/13/2015  . GERD (gastroesophageal reflux disease)   . HCAP (healthcare-associated pneumonia) 10/15/2015  . History of DVT of lower extremity   . Hyperlipemia   .  Hyperlipidemia   . Hypertension   . Myocardial infarction ([place]) 2000  . Orbital fracture ([place]) 12/2012  . OSA on CPAP   . Peripheral vascular disease, unspecified ([place])    08/20/10 doppler: increase in right ABI post-op. Left ABI stable. S/P bi-fem bypass surgery  . Pneumonia  04/04/2012  . Shortness of breath   . Stroke ([place])   . Tobacco abuse    Past Surgical History:  Past Surgical History:  Procedure Laterality Date  . abdoninal ao angio & bifem angio  05/27/10   Patent graft, occluded bil stents with no retrograde flow into the hypogastric arteries. 100% occl left ant. tibial artery, 70% to 80% to 100% stenosis right superficial fem artery above adductor canal. 100 % occl right ant. tibial vessell  . Aortogram w/ PTCA  09/292003  . BACK SURGERY    . CARDIAC CATHETERIZATION  05/27/10   severe CAD left cx  . CHOLECYSTECTOMY    . ESOPHAGOGASTRIC FUNDOPLICATION    . EYE SURGERY    . FEMORAL BYPASS  08/19/10   Right Fem-Pop  . IR RADIOLOGIST EVAL & MGMT  05/27/2016  . JOINT REPLACEMENT    . PERIPHERAL VASCULAR CATHETERIZATION N/A 12/10/2014   Procedure: Abdominal Aortogram;  Surgeon: Angelia Mould, MD;  Location: [facility];  Service: Cardiovascular;  Laterality: N/A;  . RADIOLOGY WITH ANESTHESIA N/A 02/08/2015   Procedure: RADIOLOGY WITH ANESTHESIA;  Surgeon: Luanne Bras, MD;  Location: [facility];  Service: Radiology;  Laterality: N/A;  . TEE WITHOUT CARDIOVERSION N/A 08/29/2014   Procedure: TRANSESOPHAGEAL ECHOCARDIOGRAM (TEE);  Surgeon: Josue Hector, MD;  Location: [facility];  Service: Cardiovascular;  Laterality: N/A;  . TOTAL KNEE ARTHROPLASTY     HPI:  [name] a 66 y.o.malepresenting with left hemiparesis and slurred speech. PMH is significant for ?PAF (no anticoag), HTN, CAD, dCHF, substance use, history of DVT, gout, DM, carotid stenosis s/p stents, COPD, depression and dementia. CT head with acute/subacute posterior right frontal lobe infarction. MRI 07/17/16 showed patchy multifocal acute ischemic right MCA territory infarcts, predominantly cortical and posterior right frontal in location, with involvement of the right pre and postcentral gyri. Previous bedside swallow evaluation 08/27/2014 during admission for right  corpus callosum infarction, recommended Dys 3 with thin liquids as pt is edentulous with brief f/u for tolerance, no ST follow-up recommended.   Assessment / Plan / Recommendation Clinical Impression  Patient presents with mild-moderate cognitive communication deficit with impairments in visuospatial/executive skills, attention, memory, problem solving. Administered MOCA, pt scored 21/30 (above 26 is within normal limits). Pt had significant difficulty with visuospatial tasks (2/5) and language tasks of repetition and generative naming. Given pt's history of PVD/dementia and prior CVA with resulting cognitive deficits, difficult to determine pt's baseline given absence of family/caregivers. Pt states he has an aide for 2 hours a day, 5 days a week who assists with medications, meals, bathing and dressing as well as running errands in the community. Pt states he manages his own finances. Recommend SLP f/u in SNF to faciliate safe return to home given cognitive deficits.     SLP Assessment  SLP Recommendation/Assessment: All further Speech Lanaguage Pathology  needs can be addressed in the next venue of care SLP Visit Diagnosis: Cognitive communication deficit (R41.841)    Follow Up Recommendations  Skilled Nursing facility    Frequency and Duration           SLP Evaluation Cognition  Overall Cognitive Status: History of cognitive impairments -  at baseline Arousal/Alertness: Awake/alert Orientation Level: Oriented X4 Attention: Focused;Sustained;Selective Focused Attention: Appears intact Sustained Attention: Appears intact Selective Attention: Impaired Selective Attention Impairment: Verbal basic;Functional basic Memory: Impaired Memory Impairment: Decreased recall of new information;Decreased short term memory Decreased Short Term Memory: Verbal basic;Functional basic Awareness: Impaired Awareness Impairment: Intellectual impairment Problem Solving: Impaired Problem Solving  Impairment: Verbal basic;Functional basic Executive Function: Organizing;Sequencing;Reasoning;Decision Biomedical engineer Reasoning: Impaired Reasoning Impairment: Verbal basic;Functional basic Sequencing: Impaired Sequencing Impairment: Verbal basic;Functional basic Organizing: Impaired Organizing Impairment: Verbal basic;Functional basic Decision Making: Impaired Decision Making Impairment: Verbal basic;Functional basic Self Monitoring: Impaired Self Monitoring Impairment: Verbal basic;Functional basic Self Correcting: Impaired Self Correcting Impairment: Verbal basic;Functional basic       Comprehension  Auditory Comprehension Overall Auditory Comprehension: Appears within functional limits for tasks assessed Visual Recognition/Discrimination Discrimination: Within Function Limits Reading Comprehension Reading Status: Not tested    Expression Expression Primary Mode of Expression: Verbal Verbal Expression Overall Verbal Expression: Appears within functional limits for tasks assessed Written Expression Dominant Hand: Right Written Expression: Not tested   Oral / Motor  Oral Motor/Sensory Function Overall Oral Motor/Sensory Function: Within functional limits Motor Speech Overall Motor Speech: Appears within functional limits for tasks assessed Respiration: Within functional limits Phonation: Hoarse Resonance: Within functional limits Articulation: Within functional limitis Level of Impairment:  (Pt reports he was slurring speech yesterday, now at baseline) Intelligibility: Intelligibility reduced Word: 75-100% accurate Phrase: 75-100% accurate Sentence: 75-100% accurate Conversation: 75-100% accurate Motor Planning: Witnin functional limits Motor Speech Errors: Not applicable Interfering Components: Premorbid status;Inadequate dentition Effective Techniques: Slow rate;Increased vocal intensity;Over-articulate   [place],  [place], CCC-SLP Speech-Language Pathologist [phone]         Aliene Altes 07/18/2016, 9:14 AM

## 2016-07-18 NOTE — Evaluation (Signed)
Clinical/Bedside Swallow Evaluation Patient Details  Name: [name] MRN: [id number] Date of Birth: 1950-04-12  Today's Date: 07/18/2016 Time: SLP Start Time (ACUTE ONLY): 0820 SLP Stop Time (ACUTE ONLY): 0830 SLP Time Calculation (min) (ACUTE ONLY): 10 min  Past Medical History:  Past Medical History:  Diagnosis Date  . Alcohol abuse    H/O  . Anxiety   . Arthritis   . Asthma   . CAD (coronary artery disease) 05/27/10   Cath: severe single vessell CAD left cx midportion obtuse marginal 2 to 3.  [name] CHF (congestive heart failure) ([place])   . COPD (chronic obstructive pulmonary disease) ([place])   . COPD (chronic obstructive pulmonary disease) ([place])   . Depression   . Diabetes mellitus, type 2 ([place]) 03/13/2015  . GERD (gastroesophageal reflux disease)   . HCAP (healthcare-associated pneumonia) 10/15/2015  . History of DVT of lower extremity   . Hyperlipemia   . Hyperlipidemia   . Hypertension   . Myocardial infarction ([place]) 2000  . Orbital fracture ([place]) 12/2012  . OSA on CPAP   . Peripheral vascular disease, unspecified ([place])    08/20/10 doppler: increase in right ABI post-op. Left ABI stable. S/P bi-fem bypass surgery  . Pneumonia 04/04/2012  . Shortness of breath   . Stroke ([place])   . Tobacco abuse    Past Surgical History:  Past Surgical History:  Procedure Laterality Date  . abdoninal ao angio & bifem angio  05/27/10   Patent graft, occluded bil stents with no retrograde flow into the hypogastric arteries. 100% occl left ant. tibial artery, 70% to 80% to 100% stenosis right superficial fem artery above adductor canal. 100 % occl right ant. tibial vessell  . Aortogram w/ PTCA  09/292003  . BACK SURGERY    . CARDIAC CATHETERIZATION  05/27/10   severe CAD left cx  . CHOLECYSTECTOMY    . ESOPHAGOGASTRIC FUNDOPLICATION    . EYE SURGERY    . FEMORAL BYPASS  08/19/10   Right Fem-Pop  . IR RADIOLOGIST EVAL & MGMT  05/27/2016  . JOINT REPLACEMENT    . PERIPHERAL VASCULAR  CATHETERIZATION N/A 12/10/2014   Procedure: Abdominal Aortogram;  Surgeon: Angelia Mould, MD;  Location: [facility];  Service: Cardiovascular;  Laterality: N/A;  . RADIOLOGY WITH ANESTHESIA N/A 02/08/2015   Procedure: RADIOLOGY WITH ANESTHESIA;  Surgeon: Luanne Bras, MD;  Location: [facility];  Service: Radiology;  Laterality: N/A;  . TEE WITHOUT CARDIOVERSION N/A 08/29/2014   Procedure: TRANSESOPHAGEAL ECHOCARDIOGRAM (TEE);  Surgeon: Josue Hector, MD;  Location: [facility] ENDOSCOPY;  Service: Cardiovascular;  Laterality: N/A;  . TOTAL KNEE ARTHROPLASTY     HPI:  [name] a 66 y.o.malepresenting with left hemiparesis and slurred speech. PMH is significant for ?PAF (no anticoag), HTN, CAD, dCHF, substance use, history of DVT, gout, DM, carotid stenosis s/p stents, COPD, depression and dementia. CT head with acute/subacute posterior right frontal lobe infarction. MRI 07/17/16 showed patchy multifocal acute ischemic right MCA territory infarcts, predominantly cortical and posterior right frontal in location, with involvement of the right pre and postcentral gyri. Previous bedside swallow evaluation 08/27/2014 during admission for right corpus callosum infarction, recommended Dys 3 with thin liquids as pt is edentulous with brief f/u for tolerance, no ST follow-up recommended.   Assessment / Plan / Recommendation Clinical Impression  Patient presents with oropharyngeal swallow which appears grossly within functional limits with adequate airway protection at bedside. No overt signs or symptoms of aspiration noted despite challenging  with multiple consecutive straw sips of thin liquid in excess of 3oz. Oral preparation, control and clearance adequate for solids. Pt prefers softer diet as he is edentulous. Recommend dys 3 with thin liquids, medications whole in puree. No follow-up for swallowing recommended.  SLP Visit Diagnosis: Dysphagia, unspecified (R13.10)    Aspiration Risk   Mild aspiration risk    Diet Recommendation Dysphagia 3 (Mech soft);Thin liquid   Liquid Administration via: Cup;Straw Medication Administration: Whole meds with liquid Supervision: Patient able to self feed Compensations: Slow rate;Small sips/bites Postural Changes: Seated upright at 90 degrees    Other  Recommendations Oral Care Recommendations: Oral care BID   Follow up Recommendations None      Frequency and Duration            Prognosis Prognosis for Safe Diet Advancement: Good      Swallow Study   General Date of Onset: 07/17/16 HPI: [place] [name] a 66 y.o.malepresenting with left hemiparesis and slurred speech. PMH is significant for ?PAF (no anticoag), HTN, CAD, dCHF, substance use, history of DVT, gout, DM, carotid stenosis s/p stents, COPD, depression and dementia. CT head with acute/subacute posterior right frontal lobe infarction. MRI 07/17/16 showed patchy multifocal acute ischemic right MCA territory infarcts, predominantly cortical and posterior right frontal in location, with involvement of the right pre and postcentral gyri. Previous bedside swallow evaluation 08/27/2014 during admission for right corpus callosum infarction, recommended Dys 3 with thin liquids as pt is edentulous with brief f/u for tolerance, no ST follow-up recommended. Type of Study: Bedside Swallow Evaluation Previous Swallow Assessment: see HPI Diet Prior to this Study: NPO Temperature Spikes Noted: No Respiratory Status: Room air History of Recent Intubation: No Behavior/Cognition: Alert;Cooperative;Pleasant mood Oral Cavity Assessment: Within Functional Limits Oral Care Completed by SLP: No Oral Cavity - Dentition: Edentulous Vision: Functional for self-feeding Self-Feeding Abilities: Able to feed self;Needs set up Patient Positioning: Upright in bed Baseline Vocal Quality: Hoarse Volitional Cough: Strong Volitional Swallow: Unable to elicit    Oral/Motor/Sensory Function  Overall Oral Motor/Sensory Function: Within functional limits   Ice Chips Ice chips: Within functional limits Presentation: Spoon   Thin Liquid Thin Liquid: Within functional limits Presentation: Cup;Self Fed;Straw    Nectar Thick Nectar Thick Liquid: Not tested   Honey Thick Honey Thick Liquid: Not tested   Puree Puree: Within functional limits Presentation: Spoon;Self Fed   Solid   GO   Solid: Within functional limits Presentation: Self Ethelle Lyon 07/18/2016,9:00 AM  Deneise Lever, [place], [place] Pathologist [phone]

## 2016-07-18 NOTE — Progress Notes (Signed)
Patient had some cardiac arrhythmias during the night. MD was notified, no new orders received. Continue to monitor the patient. Patient is currently comfortable and pain free.

## 2016-07-18 NOTE — Progress Notes (Signed)
*  PRELIMINARY RESULTS* Vascular Ultrasound Carotid Duplex (Doppler) has been completed.  Preliminary findings: Bilateral 1-39% ICA stenosis, antegrade vertebral flow. Right stent appears patent. Proximal left external carotid artery appears occluded.   Everrett Coombe 07/18/2016, 2:01 PM

## 2016-07-18 NOTE — Progress Notes (Signed)
  Echocardiogram 2D Echocardiogram has been performed.  Johny Chess 07/18/2016, 2:28 PM

## 2016-07-19 LAB — BASIC METABOLIC PANEL
Anion gap: 10 (ref 5–15)
BUN: 12 mg/dL (ref 6–20)
CALCIUM: 9.7 mg/dL (ref 8.9–10.3)
CO2: 24 mmol/L (ref 22–32)
Chloride: 110 mmol/L (ref 101–111)
Creatinine, Ser: 0.92 mg/dL (ref 0.61–1.24)
GFR calc Af Amer: >60 mL/min (ref >60)
GLUCOSE: 156 mg/dL — AB (ref 65–99)
Potassium: 3.5 mmol/L (ref 3.5–5.1)
SODIUM: 144 mmol/L (ref 135–145)

## 2016-07-19 LAB — CBC
HCT: 46.5 % (ref 39.0–52.0)
Hemoglobin: 15.2 g/dL (ref 13.0–17.0)
MCH: 30 pg (ref 26.0–34.0)
MCHC: 32.7 g/dL (ref 30.0–36.0)
MCV: 91.9 fL (ref 78.0–100.0)
PLATELETS: 134 10*3/uL — AB (ref 150–400)
RBC: 5.06 MIL/uL (ref 4.22–5.81)
RDW: 13.9 % (ref 11.5–15.5)
WBC: 5.1 10*3/uL (ref 4.0–10.5)

## 2016-07-19 LAB — GLUCOSE, CAPILLARY
Glucose-Capillary: 109 mg/dL — ABNORMAL HIGH (ref 65–99)
Glucose-Capillary: 101 mg/dL — ABNORMAL HIGH (ref 65–99)
Glucose-Capillary: 118 mg/dL — ABNORMAL HIGH (ref 65–99)
Glucose-Capillary: 97 mg/dL (ref 65–99)

## 2016-07-19 LAB — HEMOGLOBIN A1C
HEMOGLOBIN A1C: 5.8 % — AB (ref 4.8–5.6)
Mean Plasma Glucose: 120 mg/dL

## 2016-07-19 NOTE — Progress Notes (Signed)
Family Medicine Teaching Service Daily Progress Note Intern Pager: [phone]  Patient name: [name] Medical record number: [id number] Date of birth: [birth year] Age: 66 y.o. Gender: male  Primary Care Provider: Arnoldo Morale, MD Consultants: neurology Code Status: DNR  Pt Overview and Major Events to Date:  07/17/2016  Assessment and Plan: [name] is a 66 y.o. male with a past medical history significant for PAF, HTN, CAD, dCHF, substance use, DVT, gout, T2DM, carotid stenosis s/p stents, COPD, depression and dementia who presented with left hemiparesis and slurred speech and found to have an acute ischemic right MCA territory infarcts.    #CVA in Rt MCA territory Multiple episodes  in the past. Patient with cervical and intracranial R ICA s/p stent. CT head and MRI brain with acute/subacute infarct in right MCA territory. Exam consistent with imaging. Carotid doppler showed 1-39% bilateral occlusion with patent right stent. Lipid panel showed chole 155, HDL 36, LDL 95, Trigly 121 and normal TSH. Concern for PAF but no anticoagulation. --Follow up on Stroke Team consult , appreciate recs. --Follow up on A1c --Continue aspirin 325 mg daily, though expect 81 mg  --Continue Atorvastatin 40 mg daily, consider 80 mg given CAD --Neurovascular check q2hrs --Continue cardiac monitor  #PAF Concerning given stroke. [place] Vasc score 7. Not on any anticoagulation except aspirin. Had second degree AV block overnight. Cardiology do not recommend anticoagulation.  #2nd degree AVB-II, resolved No episode in the past 24 hours. Seen by cardiology who recommend continued monitor and no AV node blocking agent   #HTN BP this am 178/80. Given recent stroke will continue permissive hypertension --Continue to hold Coreg 25 mg bid  #CAD Had LHC on 05/27/2010 that showed severe CAD and left circumflex artery. Patient without acute chest pain or dyspnea at this time. Initial EKG and  troponin are negative. He is on Coreg, aspirin, Plavix and atorvastatin at home --Stop Coreg ion the setting of AV block and recent cocaine use --Continue aspirin 325 mg daily, though unusal for ppx --On Atorvastatin 40 mg daily, could benefit from increase in dosage to 80 mg  #dCHF Repeat ECHO on 6/9 showed an EF of 50-55% w/mild LVH and normal systolic function with W2OV. No recurrence of AV block noted 2 nights ago. Seen by cardiology this am, who recommend aggressive statin therapy, and risk factor control (i.e smoking cessation). --Continue to hold BP meds given need for permissive hypertension  #Urinary incontinence, resolved, unclear etiology  Not sure if this is related to his stroke. He has no bowel issues. UA negative. Adequate urine output since admission.  --Will continue to monitor  #Substance use UDS positive for cocaine. Given extensive cardiac history and possible arrhythmias noted during this admission, patient counseled on need to quit. --Will avoid beta blocker in the acute setting given recent use  #Tobacco use disorder Cardiology and primary team have reinforced need to quit given cardiac history, COPD and other comorbidities. --Nicoderm 21 mg   #T2DM CBG in low 100s. Last A1c 5.7 on 04/28/2016. Seems to be taken off medications. Requiring minimal rapid acting insulin since admission.  --ContinueSSI-thin --CBG qac/hs  #Peripheral Neuropathy  --Resume Gabapentin 300 mg tid  #COPD, stable On albuterol at home. He continues to smoke about 5-6 cigarettes a day. He has no respiratory symptoms.  --Continue albuterol neb prn  #OSA Patient does not use CPAP at home.   #Dementia, depression, stable Appears to be well controlled on home regimen. MOCA score 21. Per speech patient  had significant difficulty with visuospatial tasks (2/5) and language tasks of repetition and generative naming.   --Continue Prozac 20 mg daily --Continue Memantine 10 mg bid  #Gout,  stable --Continue allopurinol 300 mg daily   #Left shoulder pain Tender to palpation over his left shoulder anteriorly. Full passive range of motion without pain.  --Will continue to monitor --K pad and pain control as needed  FEN/GI: Dysphagia 3 (Mech soft; Thin Liquid), IVF 100 cc/hr Prophylaxis: SCD  Disposition: SNF pending completion of stroke work up and Neurology final recommendations  Subjective:  Patient sitting up in the chair and feeling better this morning. Patient is concerned about regaining strength in his left arm. We discussed SNF and rehab and he understand it will take time. We also addressed smoking and drug use, patient seem to have a good understanding of implications of continued use on health.  Objective: Temp:  [97.6 F (36.4 C)-98.7 F (37.1 C)] 98.7 F (37.1 C) (06/10 0600) Pulse Rate:  [77-87] 77 (06/10 0600) Resp:  [18-20] 18 (06/10 0600) BP: (173-186)/(61-88) 178/80 (06/10 0600) SpO2:  [95 %-98 %] 95 % (06/10 0600)   Physical Exam: GEN: appears well, no apparent distress. CVS: RRR, nl s1 & s2, no murmurs, 1+ edema bilaterally RESP: speaks in full sentence, no IWOB, good air movement bilaterally, CTAB GI: BS present & normal, soft, NTND GU: no suprapubic or CVA tenderness MSK: no notable swelling, mild tenderness to palpation over his left shoulder anteriorly. But no  SKIN: no apparent skin lesion NEURO: Awake, alert and oriented 4, cranial nerve was intact except for CN XI deficit on the left, motor 1/5 in LUE, 4+/5 in LLE, mildly diminished light sensation in the left upper extremities, patellar reflex 2+ on the left & 1+ on the right (history of right knee replacement), finger-to-nose intact on the right, couldn't perform on the left due to weakness.  PSYCH: euthymic mood with congruent affect  Laboratory:  Recent Labs Lab 07/17/16 1655 07/17/16 1710  WBC 6.1  --   HGB 14.9 15.0  HCT 46.6 44.0  PLT 147*  --     Recent Labs Lab  07/17/16 1655 07/17/16 1710  NA 145 145  K 3.9 3.8  CL 110 107  CO2 27  --   BUN 12 13  CREATININE 0.96 1.00  CALCIUM 9.7  --   PROT 6.4*  --   BILITOT 0.6  --   ALKPHOS 58  --   ALT 12*  --   AST 16  --   GLUCOSE 87 86    Imaging/Diagnostic Tests:  Ct Angio Head W Or Wo Contrast   IMPRESSION: 1. Negative for emergent large vessel occlusion. 2. There are patent vascular stents in both the cervical right ICA and the right ICA siphon. However, the right cervical carotid stent is taper distally and this is suspected to be hemodynamically significant. No definite intracranial in-stent stenosis identified. 3. Underlying widespread atherosclerosis of the aortic arch and head and neck vessels. 4. Ulcerated soft plaque and calcified plaque in the distal arch. Plaque in the proximal brachiocephalic artery which is approaching hemodynamically significance. 5. No significant left cervical carotid or left ICA stenosis despite plaque. Incidental left EXTERNAL carotid artery occlusion at its origin. 6. Multifocal moderate right vertebral artery stenosis at its origin and V1 segment. Mild left vertebral artery origin stenosis. 7. Stable CT appearance of the brain since yesterday. Electronically Signed   By: Genevie Ann M.D.   On: 07/18/2016 13:35  Ct Angio Neck W Or Wo Contrast   IMPRESSION: 1. Negative for emergent large vessel occlusion. 2. There are patent vascular stents in both the cervical right ICA and the right ICA siphon. However, the right cervical carotid stent is taper distally and this is suspected to be hemodynamically significant. No definite intracranial in-stent stenosis identified. 3. Underlying widespread atherosclerosis of the aortic arch and head and neck vessels. 4. Ulcerated soft plaque and calcified plaque in the distal arch. Plaque in the proximal brachiocephalic artery which is approaching hemodynamically significance. 5. No significant left cervical carotid or left ICA stenosis  despite plaque. Incidental left EXTERNAL carotid artery occlusion at its origin. 6. Multifocal moderate right vertebral artery stenosis at its origin and V1 segment. Mild left vertebral artery origin stenosis. 7. Stable CT appearance of the brain since yesterday. Electronically Signed   By: Genevie Ann M.D.   On: 07/18/2016 13:35    Marjie Skiff, MD 07/19/2016, 7:58 AM PGY-1, [facility] Intern pager: [phone], text pages welcome

## 2016-07-19 NOTE — Progress Notes (Signed)
Progress Note  Patient Name: [name] Date of Encounter: 07/19/2016  Primary Cardiologist: new Dr. Lovena Le  Subjective   No palpitations, chest pain, SOB  Inpatient Medications    Scheduled Meds: .  stroke: mapping our early stages of recovery book   Does not apply Once  . allopurinol  300 mg Oral Daily  . aspirin  300 mg Rectal Daily   Or  . aspirin  325 mg Oral Daily  . atorvastatin  40 mg Oral q1800  . FLUoxetine  20 mg Oral Daily  . insulin aspart  0-5 Units Subcutaneous QHS  . insulin aspart  0-9 Units Subcutaneous TID WC  . memantine  10 mg Oral BID  . nicotine  21 mg Transdermal Daily  . pantoprazole  40 mg Oral BH-q7a   Continuous Infusions:  PRN Meds: albuterol, traZODone   Vital Signs    Vitals:   07/18/16 1658 07/18/16 2236 07/19/16 0031 07/19/16 0600  BP: (!) 180/65 (!) 173/61 (!) 186/88 (!) 178/80  Pulse: 81 80 87 77  Resp: 20 20 18 18   Temp: 97.6 F (36.4 C) 98.2 F (36.8 C) 98.1 F (36.7 C) 98.7 F (37.1 C)  TempSrc: Oral Oral Oral Oral  SpO2: 98% 97% 97% 95%  Weight:      Height:        Intake/Output Summary (Last 24 hours) at 07/19/16 0714 Last data filed at 07/19/16 0600  Gross per 24 hour  Intake             1200 ml  Output             3700 ml  Net            -2500 ml   Filed Weights   07/17/16 1700 07/17/16 2120  Weight: 228 lb 13.4 oz (103.8 kg) 232 lb 4.8 oz (105.4 kg)    Telemetry    NSR - Personally Reviewed  ECG    None today - Personally Reviewed  Physical Exam   GEN: No acute distress.   Neck: No JVD Cardiac: RRR, no murmurs, rubs, or gallops.  Respiratory: Clear to auscultation bilaterally. GI: Soft, nontender, non-distended  MS: No edema; No deformity. Neuro:  Nonfocal  Psych: Normal affect   Labs    Chemistry Recent Labs Lab 07/17/16 1655 07/17/16 1710  NA 145 145  K 3.9 3.8  CL 110 107  CO2 27  --   GLUCOSE 87 86  BUN 12 13  CREATININE 0.96 1.00  CALCIUM 9.7  --   PROT 6.4*  --     ALBUMIN 3.6  --   AST 16  --   ALT 12*  --   ALKPHOS 58  --   BILITOT 0.6  --   GFRNONAA >60  --   GFRAA >60  --   ANIONGAP 8  --      Hematology Recent Labs Lab 07/17/16 1655 07/17/16 1710  WBC 6.1  --   RBC 4.98  --   HGB 14.9 15.0  HCT 46.6 44.0  MCV 93.6  --   MCH 29.9  --   MCHC 32.0  --   RDW 14.1  --   PLT 147*  --     Cardiac EnzymesNo results for input(s): TROPONINI in the last 168 hours.  Recent Labs Lab 07/17/16 1708  TROPIPOC 0.00     BNPNo results for input(s): BNP, PROBNP in the last 168 hours.   DDimer No results for input(s):  DDIMER in the last 168 hours.   Radiology    Ct Angio Head W Or Wo Contrast  Result Date: 07/18/2016 CLINICAL DATA:  66 year old male with patchy right MCA ischemia detected on MRI performed for code stroke presentation with left side weakness. Right ICA siphon stent. EXAM: CT ANGIOGRAPHY HEAD AND NECK TECHNIQUE: Multidetector CT imaging of the head and neck was performed using the standard protocol during bolus administration of intravenous contrast. Multiplanar CT image reconstructions and MIPs were obtained to evaluate the vascular anatomy. Carotid stenosis measurements (when applicable) are obtained utilizing NASCET criteria, using the distal internal carotid diameter as the denominator. CONTRAST:  50 mL Isovue 370 COMPARISON:  Brain MRI and intracranial MRA 07/17/2016 FINDINGS: CTA NECK Skeleton: Absent dentition. Previous left orbital floor and left maxilla repair. Cervical spine degeneration with C2-C3 ankylosis. No acute osseous abnormality identified. Aside from the left maxillary, paranasal sinuses and mastoids are stable and well pneumatized. Upper chest: Mild scarring and dependent atelectasis in the visible lung parenchyma. Small volume pericardial fluid in the superior pericardial recess. No superior mediastinal lymphadenopathy. Other neck: Incidental intravenous gas occasionally noted in the neck, most prominently in an  anterior thyroidal vein on the left. Otherwise negative. No cervical lymphadenopathy. Aortic arch: Extensive soft and calcified atherosclerosis in the aortic arch. Areas of ulcerated plaque in the distal arch (series 8, image 147). Right carotid system: 40-50% stenosis at the proximal brachiocephalic artery related to soft and calcified plaque (series 8, image 124). No significant right CCA origin stenosis despite soft plaque. There is a distal right CCA through proximal right ICA vascular stent in place which is patent, but is tapered distally which appears to result in the degree of circumferential stenosis (series 5, image 82 and series 8, image 123). No other in stent stenosis is evident. Distal to this the cervical right ICA is patent to the skullbase with only minimal calcified plaque. Left carotid system: No left CCA origin stenosis despite soft and calcified plaque. Intermittent soft plaque in the left CCA without stenosis. At the left carotid bifurcation the left ECA origin is occluded. There is soft and calcified plaque in the left ICA origin and bulb resulting in a tapered appearance of the vessel, but no focal hemodynamically significant stenosis. The left ICA remains patent to the skullbase. Vertebral arteries: Soft and calcified plaque in the proximal right subclavian artery does not appear hemodynamically significant. Calcified plaque at the right vertebral artery origin and right V1 segment. The right vertebral artery is diminutive, such that there could be up to moderate stenosis. The left vertebral artery remains patent to the neck with other intermittent right V2 and V3 segment calcified plaque. No significant proximal left subclavian artery stenosis despite abundant calcified plaque. Calcified plaque at the left vertebral artery origin with mild stenosis. Intermittent left vertebral artery V2 and V3 segment calcified plaque without significant stenosis. The left PICA origin is patent and arises  just proximal to the dura at the C1 level. CTA HEAD Posterior circulation: Patent in fairly codominant distal vertebral arteries without additional vertebral artery stenosis. Patent vertebrobasilar junction without stenosis. No basilar artery stenosis. SCA and PCA origins are patent. Posterior communicating arteries are diminutive or absent. There is mild bilateral P2 segment irregularity. PCA branches are otherwise within normal limits. Anterior circulation: The right ICA siphon stent from the cavernous to the supraclinoid segment appears patent. No definite in stent stenosis. The left ICA siphon is patent with moderate calcified plaque in the cavernous segment. No  hemodynamically significant stenosis. Patent carotid termini. Normal MCA origins. Dominant left A1 segment. The right A1 is patent. Bilateral ACA branches are within normal limits. Left MCA M1 segment, bifurcation, and left MCA branches are within normal limits. Right MCA M1 segment, and right MCA bifurcation are patent. No right MCA branch occlusion is identified, and the right MCA branches appear more symmetric to the left side on CTA today versus the recent MRA. Venous sinuses: Patent. Anatomic variants: None. Delayed phase: No abnormal enhancement identified. Stable hypodensity at the superior and lateral right peri-Rolandic cortex corresponding to the diffusion abnormality on MRI. No intracranial hemorrhage or mass effect. Review of the MIP images confirms the above findings IMPRESSION: 1. Negative for emergent large vessel occlusion. 2. There are patent vascular stents in both the cervical right ICA and the right ICA siphon. However, the right cervical carotid stent is taper distally and this is suspected to be hemodynamically significant. No definite intracranial in-stent stenosis identified. 3. Underlying widespread atherosclerosis of the aortic arch and head and neck vessels. 4. Ulcerated soft plaque and calcified plaque in the distal arch.  Plaque in the proximal brachiocephalic artery which is approaching hemodynamically significance. 5. No significant left cervical carotid or left ICA stenosis despite plaque. Incidental left EXTERNAL carotid artery occlusion at its origin. 6. Multifocal moderate right vertebral artery stenosis at its origin and V1 segment. Mild left vertebral artery origin stenosis. 7. Stable CT appearance of the brain since yesterday. Electronically Signed   By: Genevie Ann M.D.   On: 07/18/2016 13:35   Dg Chest 2 View  Result Date: 07/17/2016 CLINICAL DATA:  Chest pain.  Recent stroke. EXAM: CHEST  2 VIEW COMPARISON:  05/29/2016 and prior radiographs FINDINGS: Cardiomegaly again noted. There is no evidence of focal airspace disease, pulmonary edema, suspicious pulmonary nodule/mass, pleural effusion, or pneumothorax. No acute bony abnormalities are identified. IMPRESSION: Cardiomegaly without evidence of acute cardiopulmonary disease. Electronically Signed   By: Margarette Canada M.D.   On: 07/17/2016 18:36   Ct Angio Neck W Or Wo Contrast  Result Date: 07/18/2016 CLINICAL DATA:  66 year old male with patchy right MCA ischemia detected on MRI performed for code stroke presentation with left side weakness. Right ICA siphon stent. EXAM: CT ANGIOGRAPHY HEAD AND NECK TECHNIQUE: Multidetector CT imaging of the head and neck was performed using the standard protocol during bolus administration of intravenous contrast. Multiplanar CT image reconstructions and MIPs were obtained to evaluate the vascular anatomy. Carotid stenosis measurements (when applicable) are obtained utilizing NASCET criteria, using the distal internal carotid diameter as the denominator. CONTRAST:  50 mL Isovue 370 COMPARISON:  Brain MRI and intracranial MRA 07/17/2016 FINDINGS: CTA NECK Skeleton: Absent dentition. Previous left orbital floor and left maxilla repair. Cervical spine degeneration with C2-C3 ankylosis. No acute osseous abnormality identified. Aside from the  left maxillary, paranasal sinuses and mastoids are stable and well pneumatized. Upper chest: Mild scarring and dependent atelectasis in the visible lung parenchyma. Small volume pericardial fluid in the superior pericardial recess. No superior mediastinal lymphadenopathy. Other neck: Incidental intravenous gas occasionally noted in the neck, most prominently in an anterior thyroidal vein on the left. Otherwise negative. No cervical lymphadenopathy. Aortic arch: Extensive soft and calcified atherosclerosis in the aortic arch. Areas of ulcerated plaque in the distal arch (series 8, image 147). Right carotid system: 40-50% stenosis at the proximal brachiocephalic artery related to soft and calcified plaque (series 8, image 124). No significant right CCA origin stenosis despite soft plaque. There is a  distal right CCA through proximal right ICA vascular stent in place which is patent, but is tapered distally which appears to result in the degree of circumferential stenosis (series 5, image 82 and series 8, image 123). No other in stent stenosis is evident. Distal to this the cervical right ICA is patent to the skullbase with only minimal calcified plaque. Left carotid system: No left CCA origin stenosis despite soft and calcified plaque. Intermittent soft plaque in the left CCA without stenosis. At the left carotid bifurcation the left ECA origin is occluded. There is soft and calcified plaque in the left ICA origin and bulb resulting in a tapered appearance of the vessel, but no focal hemodynamically significant stenosis. The left ICA remains patent to the skullbase. Vertebral arteries: Soft and calcified plaque in the proximal right subclavian artery does not appear hemodynamically significant. Calcified plaque at the right vertebral artery origin and right V1 segment. The right vertebral artery is diminutive, such that there could be up to moderate stenosis. The left vertebral artery remains patent to the neck with  other intermittent right V2 and V3 segment calcified plaque. No significant proximal left subclavian artery stenosis despite abundant calcified plaque. Calcified plaque at the left vertebral artery origin with mild stenosis. Intermittent left vertebral artery V2 and V3 segment calcified plaque without significant stenosis. The left PICA origin is patent and arises just proximal to the dura at the C1 level. CTA HEAD Posterior circulation: Patent in fairly codominant distal vertebral arteries without additional vertebral artery stenosis. Patent vertebrobasilar junction without stenosis. No basilar artery stenosis. SCA and PCA origins are patent. Posterior communicating arteries are diminutive or absent. There is mild bilateral P2 segment irregularity. PCA branches are otherwise within normal limits. Anterior circulation: The right ICA siphon stent from the cavernous to the supraclinoid segment appears patent. No definite in stent stenosis. The left ICA siphon is patent with moderate calcified plaque in the cavernous segment. No hemodynamically significant stenosis. Patent carotid termini. Normal MCA origins. Dominant left A1 segment. The right A1 is patent. Bilateral ACA branches are within normal limits. Left MCA M1 segment, bifurcation, and left MCA branches are within normal limits. Right MCA M1 segment, and right MCA bifurcation are patent. No right MCA branch occlusion is identified, and the right MCA branches appear more symmetric to the left side on CTA today versus the recent MRA. Venous sinuses: Patent. Anatomic variants: None. Delayed phase: No abnormal enhancement identified. Stable hypodensity at the superior and lateral right peri-Rolandic cortex corresponding to the diffusion abnormality on MRI. No intracranial hemorrhage or mass effect. Review of the MIP images confirms the above findings IMPRESSION: 1. Negative for emergent large vessel occlusion. 2. There are patent vascular stents in both the  cervical right ICA and the right ICA siphon. However, the right cervical carotid stent is taper distally and this is suspected to be hemodynamically significant. No definite intracranial in-stent stenosis identified. 3. Underlying widespread atherosclerosis of the aortic arch and head and neck vessels. 4. Ulcerated soft plaque and calcified plaque in the distal arch. Plaque in the proximal brachiocephalic artery which is approaching hemodynamically significance. 5. No significant left cervical carotid or left ICA stenosis despite plaque. Incidental left EXTERNAL carotid artery occlusion at its origin. 6. Multifocal moderate right vertebral artery stenosis at its origin and V1 segment. Mild left vertebral artery origin stenosis. 7. Stable CT appearance of the brain since yesterday. Electronically Signed   By: Genevie Ann M.D.   On: 07/18/2016 13:35  Mr Brain Wo Contrast  Result Date: 07/18/2016 CLINICAL DATA:  Initial evaluation for left hemi paresis, slurred speech. History of prior proximal right ICA and supraclinoid stenting. EXAM: MRI HEAD WITHOUT CONTRAST MRA HEAD WITHOUT CONTRAST TECHNIQUE: Multiplanar, multiecho pulse sequences of the brain and surrounding structures were obtained without intravenous contrast. Angiographic images of the head were obtained using MRA technique without contrast. COMPARISON:  Prior CT earlier the same day as well as previous MRI from 02/06/2015. FINDINGS: MRI HEAD FINDINGS Brain: Study moderately degraded by motion artifact. Generalized age related cerebral atrophy. Patchy and confluent T2/FLAIR hyperintensity within the periventricular and deep white matter both cerebral hemispheres most consistent with chronic small vessel ischemic disease. Overall, changes are moderate in nature. There is patchy predominantly cortical abnormal restricted diffusion involving the right frontoparietal region, with involvement of the pre and postcentral gyrus (series 9, image 46). Additional patchy  cortical involvement more anteriorly within the right frontal lobe (series 9, image 43). Few small foci within the periventricular and subcortical white matter of the right frontal and parietal lobes (series 9, image 34). These are acute in nature with corresponding signal loss on ADC map. Probable trace petechial hemorrhage within a few areas of infarction at the posterior right frontal region (series 11, image 24, 23). Few more vague foci of diffusion abnormality without definite ADC correlate within the right frontal lobe (series 9, image 37), right periatrial white matter (series 9, image 25), and right parietal lobe (series 9, image 31) are suspected to be more subacute in nature. No other evidence for acute ischemia. Few scattered foci of chronic microhemorrhage at the parasagittal right parietal lobe and right occipital lobe noted, stable from prior. No mass lesion, midline shift or mass effect. No hydrocephalus. No extra-axial fluid collection. Major dural sinuses are grossly patent. Incidental note made of an empty sella. Vascular: Chronic abnormal flow void at the right carotid siphon, poorly visualized on this motion degraded exam. Major intracranial vascular flow voids otherwise maintained. Skull and upper cervical spine: Craniocervical junction normal. Visualized upper cervical spine unremarkable. Bone marrow signal intensity normal. No scalp soft tissue abnormality. Sinuses/Orbits: Globes and orbital soft tissues grossly unremarkable. Patient status post lens extraction bilaterally. Scattered mucosal thickening noted within ethmoidal air cells and left maxillary sinus. No air-fluid level to suggest active sinus infection. No mastoid effusion. MRA HEAD FINDINGS ANTERIOR CIRCULATION: Distal cervical segments of the internal carotid artery is are patent with antegrade flow. Right ICA diminutive as compared to the left. Petrous, cavernous, and supraclinoid left ICA patent without flow-limiting stenosis.  Petrous right ICA patent. There is loss of signal flow void at the cavernous/ supraclinoid right ICA related to the right ICA stent. Distal reconstitution at the right ICA terminus which is patent. Left A1 segment dominant and widely patent. Right A1 segment hypoplastic but patent as well. Anterior communicating artery normal. Anterior cerebral arteries patent to their distal aspects. Left M1 segment, proximal M2 branches, and distal left MCA branches are widely patent. Patent flow through the right M1 segment without stenosis or occlusion. Flow is mildly attenuated as compared to the left. No proximal M2 occlusion on the right. Distal right MCA branches mildly attenuated as compared to the left. POSTERIOR CIRCULATION: Vertebral arteries widely patent to the vertebrobasilar junction. Posterior inferior cerebral arteries patent proximally. Basilar artery widely patent. Superior cerebral arteries and posterior cerebral arteries widely patent bilaterally without stenosis. No aneurysm or vascular malformation. IMPRESSION: MRI HEAD IMPRESSION: 1. Patchy multifocal acute ischemic right MCA territory  infarcts, predominantly cortical and posterior right frontal in location, with involvement of the right pre and postcentral gyri. Associated mild petechial hemorrhage without hemorrhagic transformation. Probable few scattered superimposed more subacute infarcts as above. 2. Moderate chronic microvascular ischemic disease. MRA HEAD IMPRESSION: 1. Absent flow void at the level of the right cavernous/supraclinoid stent with distal reconstitution at the right ICA terminus. Patent flow within the right MCA artery and its branches, although somewhat attenuated as compared to the contralateral left. Vascular patency through the stent could further evaluated with CTA as clinically desired. 2. Otherwise negative intracranial MRA. Electronically Signed   By: Jeannine Boga M.D.   On: 07/18/2016 02:01   Mr Jodene Nam Headm  Result  Date: 07/18/2016 CLINICAL DATA:  Initial evaluation for left hemi paresis, slurred speech. History of prior proximal right ICA and supraclinoid stenting. EXAM: MRI HEAD WITHOUT CONTRAST MRA HEAD WITHOUT CONTRAST TECHNIQUE: Multiplanar, multiecho pulse sequences of the brain and surrounding structures were obtained without intravenous contrast. Angiographic images of the head were obtained using MRA technique without contrast. COMPARISON:  Prior CT earlier the same day as well as previous MRI from 02/06/2015. FINDINGS: MRI HEAD FINDINGS Brain: Study moderately degraded by motion artifact. Generalized age related cerebral atrophy. Patchy and confluent T2/FLAIR hyperintensity within the periventricular and deep white matter both cerebral hemispheres most consistent with chronic small vessel ischemic disease. Overall, changes are moderate in nature. There is patchy predominantly cortical abnormal restricted diffusion involving the right frontoparietal region, with involvement of the pre and postcentral gyrus (series 9, image 46). Additional patchy cortical involvement more anteriorly within the right frontal lobe (series 9, image 43). Few small foci within the periventricular and subcortical white matter of the right frontal and parietal lobes (series 9, image 34). These are acute in nature with corresponding signal loss on ADC map. Probable trace petechial hemorrhage within a few areas of infarction at the posterior right frontal region (series 11, image 24, 23). Few more vague foci of diffusion abnormality without definite ADC correlate within the right frontal lobe (series 9, image 37), right periatrial white matter (series 9, image 25), and right parietal lobe (series 9, image 31) are suspected to be more subacute in nature. No other evidence for acute ischemia. Few scattered foci of chronic microhemorrhage at the parasagittal right parietal lobe and right occipital lobe noted, stable from prior. No mass lesion,  midline shift or mass effect. No hydrocephalus. No extra-axial fluid collection. Major dural sinuses are grossly patent. Incidental note made of an empty sella. Vascular: Chronic abnormal flow void at the right carotid siphon, poorly visualized on this motion degraded exam. Major intracranial vascular flow voids otherwise maintained. Skull and upper cervical spine: Craniocervical junction normal. Visualized upper cervical spine unremarkable. Bone marrow signal intensity normal. No scalp soft tissue abnormality. Sinuses/Orbits: Globes and orbital soft tissues grossly unremarkable. Patient status post lens extraction bilaterally. Scattered mucosal thickening noted within ethmoidal air cells and left maxillary sinus. No air-fluid level to suggest active sinus infection. No mastoid effusion. MRA HEAD FINDINGS ANTERIOR CIRCULATION: Distal cervical segments of the internal carotid artery is are patent with antegrade flow. Right ICA diminutive as compared to the left. Petrous, cavernous, and supraclinoid left ICA patent without flow-limiting stenosis. Petrous right ICA patent. There is loss of signal flow void at the cavernous/ supraclinoid right ICA related to the right ICA stent. Distal reconstitution at the right ICA terminus which is patent. Left A1 segment dominant and widely patent. Right A1 segment hypoplastic but patent  as well. Anterior communicating artery normal. Anterior cerebral arteries patent to their distal aspects. Left M1 segment, proximal M2 branches, and distal left MCA branches are widely patent. Patent flow through the right M1 segment without stenosis or occlusion. Flow is mildly attenuated as compared to the left. No proximal M2 occlusion on the right. Distal right MCA branches mildly attenuated as compared to the left. POSTERIOR CIRCULATION: Vertebral arteries widely patent to the vertebrobasilar junction. Posterior inferior cerebral arteries patent proximally. Basilar artery widely patent.  Superior cerebral arteries and posterior cerebral arteries widely patent bilaterally without stenosis. No aneurysm or vascular malformation. IMPRESSION: MRI HEAD IMPRESSION: 1. Patchy multifocal acute ischemic right MCA territory infarcts, predominantly cortical and posterior right frontal in location, with involvement of the right pre and postcentral gyri. Associated mild petechial hemorrhage without hemorrhagic transformation. Probable few scattered superimposed more subacute infarcts as above. 2. Moderate chronic microvascular ischemic disease. MRA HEAD IMPRESSION: 1. Absent flow void at the level of the right cavernous/supraclinoid stent with distal reconstitution at the right ICA terminus. Patent flow within the right MCA artery and its branches, although somewhat attenuated as compared to the contralateral left. Vascular patency through the stent could further evaluated with CTA as clinically desired. 2. Otherwise negative intracranial MRA. Electronically Signed   By: Jeannine Boga M.D.   On: 07/18/2016 02:01   Ct Head Code Stroke W/o Cm  Result Date: 07/17/2016 CLINICAL DATA:  Code stroke. Right-sided facial droop beginning 1 hour ago. Left-sided weakness beginning 6 days ago. EXAM: CT HEAD WITHOUT CONTRAST TECHNIQUE: Contiguous axial images were obtained from the base of the skull through the vertex without intravenous contrast. COMPARISON:  CT head without contrast 05/29/2016. FINDINGS: Brain: An acute/subacute posterior right frontal lobe nonhemorrhagic infarct is more prominent than on the prior exam. No acute or subacute left-sided infarct is present. The basal ganglia are intact bilaterally. The insular ribbon is normal. No acute hemorrhage or mass lesion is present. The ventricles are of normal size. No significant extra-axial fluid collection is present. Vascular: A cavernous right internal carotid artery stent is in place. There is no hyperdense vessel. Atherosclerotic changes are noted  bilaterally. Skull: The calvarium is intact. No focal lytic or blastic lesions are present. Sinuses/Orbits: The left maxillary sinus roof and orbital floor is status post ORIF. Left maxillary sinus is chronically shrunken. The remaining paranasal sinuses and the mastoid air cells are clear bilaterally. Rightward nasal septal deviation is present. Bilateral lens replacements are present. The globes and orbits are within normal limits otherwise. ASPECTS [facility] Stroke Program Early CT Score) - Ganglionic level infarction (caudate, lentiform nuclei, internal capsule, insula, M1-M3 cortex): 7/7 - Supraganglionic infarction (M4-M6 cortex): 2/3 Total score (0-10 with 10 being normal): 9/10 IMPRESSION: 1. Posterior right frontal lobe acute/subacute infarct is more prominent than on the study 2 months ago. This may reflect evolution of the previous infarct or a a progressive new infarct. 2. No other acute infarct. 3. ASPECTS is 9/10 Electronically Signed   By: San Morelle M.D.   On: 07/17/2016 17:37    Cardiac Studies   Echo 07/18/16:  Patient Profile     66 y.o. male Study Conclusions  - Left ventricle: The cavity size was normal. Wall thickness was   increased in a pattern of mild LVH. Systolic function was normal.   The estimated ejection fraction was in the range of 50% to 55%.   Doppler parameters are consistent with abnormal left ventricular   relaxation (grade 1 diastolic dysfunction). -  Aortic valve: Appears tri leaflet with calcified non coronary   cusp. - Left atrium: The atrium was mildly dilated. - Atrial septum: No defect or patent foramen ovale was identified.   Assessment & Plan    1. AV block - his telemetry demonstrates these episodes occurring in the early morning hours when he is asleep. No recurrence overnight.  I would recommend watchful waiting and avoidance of AV nodal blocking drugs. 2. NSVT - I am sure he has CAD but his LV function is normal. In absence of  symptoms, no treatment. 3. Stroke - He has lots of cerebral vascular disease. I do worry about occult PAF. Would not anti-coagulate at this point. 4. Dyslipidemia - he will need aggressive statin therapy 5. Tobacco abuse - he is still smoking. I discussed the importance of smoking cessation.  No arrhythmias since yesterday. Will sign off. Call if needed. Signed, Ramere Downs [place], MD  07/19/2016, 7:14 AM

## 2016-07-19 NOTE — Progress Notes (Signed)
Spoke to neurologist who is concerned about R carotid stenosis based on CTA results as it is in the distribution of the patient's CVA.  Advised to place order for IR cerebral angiogram. Patient will be NPO past midnight.  Archie Patten, MD [facility] Family Medicine Resident  07/19/2016, 12:13 PM

## 2016-07-19 NOTE — Clinical Social Work Note (Signed)
Clinical Social Work Assessment  Patient Details  Name: [name] MRN: [id number] Date of Birth: 12/11/1950  Date of referral:  07/19/16               Reason for consult:  Discharge Planning                Permission sought to share information with:  Family Supports Permission granted to share information::     Name::        Agency::     Relationship::     Contact Information:     Housing/Transportation Living arrangements for the past 2 months:  Single Family Home Source of Information:  Patient Patient Interpreter Needed:  None Criminal Activity/Legal Involvement Pertinent to Current Situation/Hospitalization:  No - Comment as needed Significant Relationships:  None (pt stated all of his family is "long gone and dead") Lives with:  Self Do you feel safe going back to the place where you live?  Yes Need for family participation in patient care:  No (Coment)  Care giving concerns:  Patient stated he lives at home alone and does not have any family support. Patient stated his family is "dead and long gone"   Facilities manager / plan:  Holiday representative met patient at bedside to offer support and discuss patient discharge needs. Patient stated he does not have a lot of support and he takes care of himself. Patient stated he has an aid that comes into the home 5 days/ week for 2 hours every day to help him with stuff around the house. Patient is willing to attend SNF and stated he has been at [facility] in the past. Patient stated he know he needs rehab before going home and mentioned "he feels useless because he doesn't have the ability to take care of himself". CSW to complete necessary paperwork and initiate SNF search on patients behalf. CSW will update patient once bed offers are available   Employment status:  Retired Forensic scientist:  Managed Care PT Recommendations:  [place] / Referral to community resources:  [place]  Patient/Family's Response to care:  Patient aware of CSW role in patients care while being at [employer]   Patient/Family's Understanding of and Emotional Response to Diagnosis, Current Treatment, and Prognosis:  Patient feel very depressed and sad due to him not having the independence he is accustomed too. Patient is very eager to go to SNF to do PT so he is able to go back home  Emotional Assessment Appearance:  Appears stated age Attitude/Demeanor/Rapport:  Other Affect (typically observed):  Pleasant Orientation:  Oriented to Situation, Oriented to  Time, Oriented to Place, Oriented to Self Alcohol / Substance use:  Alcohol Use, Tobacco Use (pt has a history of substance use) Psych involvement (Current and /or in the community):  No (Comment)  Discharge Needs  Concerns to be addressed:  No discharge needs identified Readmission within the last 30 days:  No Current discharge risk:  Lack of support system Barriers to Discharge:  No Barriers Identified   Wende Neighbors, LCSW 07/19/2016, 11:06 AM

## 2016-07-19 NOTE — NC FL2 (Signed)
[place] LEVEL OF CARE SCREENING TOOL     IDENTIFICATION  Patient Name: [name] Birthdate: [birth year] Sex: male Admission Date (Current Location): 07/17/2016  [facility] and [place] Number:  Herbalist and Address:  [facility], [place] [address] [place], [place] [address]      Provider Number: [id number]  Attending Physician Name and Address:  Zenia Resides, MD  Relative Name and Phone Number:       Current Level of Care: Hospital Recommended Level of Care: [place] Prior Approval Number:    Date Approved/Denied:   PASRR Number: [id number]  Discharge Plan: SNF    Current Diagnoses: Patient Active Problem List   Diagnosis Date Noted  . Cerebral thrombosis with cerebral infarction 07/18/2016  . Acute ischemic stroke ([place])   . Left hemiplegia ([place])   . Stroke-like symptoms 07/17/2016  . Insomnia 01/29/2016  . Depression 01/29/2016  . Alcohol abuse with intoxication ([place]) 10/16/2015  . Psoriasiform dermatitis 07/12/2015  . Dementia due to another medical condition 07/12/2015  . Urinary incontinence 03/14/2015  . Diabetes mellitus, type 2 ([place]) 03/13/2015  . Hemiparesis affecting left side as late effect of cerebrovascular accident ([place]) 02/18/2015  . Cerebrovascular accident (CVA) due to stenosis of right carotid artery ([place])   . Coronary artery disease involving native coronary artery of native heart without angina pectoris   . Tachypnea   . Thrombocytopenia ([place])   . History of stroke   . Stroke (cerebrum) ([place]) 02/06/2015  . Arterial ischemic stroke, MCA (middle cerebral artery), right, acute ([place])   . DOE (dyspnea on exertion) 11/23/2014  . PAF (paroxysmal atrial fibrillation) ([place]) 11/23/2014  . HLD (hyperlipidemia) 11/01/2014  . Tobacco use disorder 11/01/2014  . Diastolic CHF, acute on chronic (HCC) 11/01/2014  . Cerebral infarction due to stenosis of right carotid artery ([place])  11/01/2014  . Carotid stenosis 11/01/2014  . Hyperkalemia 10/24/2014  . H/O total knee replacement 10/24/2014  . Essential hypertension   . Polysubstance abuse 08/28/2014  . Cocaine abuse   . Intracranial carotid stenosis   . Headache around the eyes 08/27/2014  . Acute CVA (cerebrovascular accident) ([place]) 08/27/2014  . CVA (cerebral vascular accident) ([place]) 08/26/2014  . History of DVT (deep vein thrombosis)   . Alcoholism ([place])   . Embolic stroke ([place])   . Cerebral infarction due to unspecified mechanism   . Hypotension   . AKI (acute kidney injury) ([place])   . Stroke ([place])   . CVA (cerebral infarction) 08/01/2014  . Alcohol dependence with withdrawal with complication ([place]) 54/27/0623  . DVT (deep venous thrombosis) ([place]) 02/22/2014  . Lower extremity edema 02/21/2014  . Chronic diastolic congestive heart failure ([place]) 11/21/2013  . CAD (coronary artery disease) 11/21/2013  . Alcohol abuse 11/21/2013  . GERD (gastroesophageal reflux disease) 08/10/2013  . Gout 08/10/2013  . Tobacco abuse 07/26/2013  . Trichiasis without entropion 04/16/2013  . Dizziness 04/10/2013  . Eyelid lesion 01/24/2013  . Enophthalmos due to trauma 01/19/2013  . Medial orbital wall fracture (HCC) 01/19/2013  . Closed blow-out fracture of floor of orbit ([place]) 01/19/2013  . Binocular vision disorder with diplopia 01/03/2013  . Fracture of inferior orbital wall ([place]) 01/03/2013  . Fracture of orbital floor ([place]) 01/03/2013  . Pain of left eye 01/03/2013  . Peripheral vascular disease ([place]) 07/27/2012  . COPD (chronic obstructive pulmonary disease) ([place]) 04/04/2012  . HTN (hypertension) 04/04/2012    Orientation RESPIRATION BLADDER Height &  Weight     Self, Time, Situation, Place  Normal Continent Weight: 232 lb 4.8 oz (105.4 kg) Height:  6' (182.9 cm)  BEHAVIORAL SYMPTOMS/MOOD NEUROLOGICAL BOWEL NUTRITION STATUS      Continent Diet (see dc summary)  AMBULATORY STATUS COMMUNICATION OF NEEDS Skin    Extensive Assist Verbally Normal                       Personal Care Assistance Level of Assistance  Bathing, Feeding, Dressing Bathing Assistance: Maximum assistance Feeding assistance: Limited assistance Dressing Assistance: Maximum assistance     Functional Limitations Info  Sight, Hearing, Speech Sight Info: Adequate Hearing Info: Impaired Speech Info: Adequate    SPECIAL CARE FACTORS FREQUENCY  PT (By licensed PT), OT (By licensed OT)     PT Frequency: 5x wk OT Frequency: 5x wk            Contractures Contractures Info: Not present    Additional Factors Info  Code Status Code Status Info: DNR             Current Medications (07/19/2016):  This is the current hospital active medication list Current Facility-Administered Medications  Medication Dose Route Frequency Provider Last Rate Last Dose  .  stroke: mapping our early stages of recovery book   Does not apply Once Darrel Reach, MD      . albuterol (PROVENTIL) (2.5 MG/3ML) 0.083% nebulizer solution 2.5 mg  2.5 mg Nebulization Q6H PRN Wendee Beavers T, MD      . allopurinol (ZYLOPRIM) tablet 300 mg  300 mg Oral Daily Orson Eva J, DO   300 mg at 07/19/16 0855  . aspirin suppository 300 mg  300 mg Rectal Daily Darrel Reach, MD   300 mg at 07/17/16 1851   Or  . aspirin tablet 325 mg  325 mg Oral Daily Darrel Reach, MD   325 mg at 07/18/16 1539  . atorvastatin (LIPITOR) tablet 40 mg  40 mg Oral q1800 Orson Eva J, DO   40 mg at 07/18/16 1539  . FLUoxetine (PROZAC) capsule 20 mg  20 mg Oral Daily Zenia Resides, MD   20 mg at 07/19/16 0855  . insulin aspart (novoLOG) injection 0-5 Units  0-5 Units Subcutaneous QHS Gonfa, Taye T, MD      . insulin aspart (novoLOG) injection 0-9 Units  0-9 Units Subcutaneous TID WC Mercy Riding, MD   2 Units at 07/18/16 1716  . memantine (NAMENDA) tablet 10 mg  10 mg Oral BID Orson Eva J, DO   10 mg at 07/19/16 0855  . nicotine (NICODERM CQ - dosed in  mg/24 hours) patch 21 mg  21 mg Transdermal Daily Wendee Beavers T, MD   21 mg at 07/19/16 0858  . pantoprazole (PROTONIX) EC tablet 40 mg  40 mg Oral Humberto Leep, Elsia J, DO   40 mg at 07/19/16 0855  . traZODone (DESYREL) tablet 50 mg  50 mg Oral QHS PRN Bufford Lope, DO   50 mg at 07/18/16 2137     Discharge Medications: Please see discharge summary for a list of discharge medications.  Relevant Imaging Results:  Relevant Lab Results:   Additional Information SS# [id number]  Wende Neighbors, LCSW

## 2016-07-19 NOTE — Progress Notes (Signed)
STROKE TEAM PROGRESS NOTE   HISTORY OF PRESENT ILLNESS (per record) This is a 66 year old right-handed man who was brought to the emergency department by his primary care aide. History is obtained from the patient. His gait is present at the bedside and offers additional information as needed.  The patient reports that he developed weakness in his left arm on 07/11/16. This weakness has been present ever since. He describes difficulty moving his left arm and says that he was unable to hold onto anything with his left hand. He states that he did not seek medical attention at that time because he "had other things to do." Today, he noticed worsening of the weakness in his left arm, and now is unable to move it at all. In addition, he has noticed some increasing weakness in his left leg today as well as some slurred speech. His healthcare aide reports that she noticed he was having difficulty walking and not why she had him brought to the emergency department.  The patient reports that he is supposed be taking a baby aspirin every day but he does not. He says he only takes when he feels like he needs it which is not very often. He continues to smoke. He tells me that he smokes crack and he last used crack cocaine yesterday. He has a history of 3 prior strokes.  The patient was taken for an emergent CT scan of the head which showed evidence of an acute to subacute right frontal lobe infarction without hemorrhage. He is not a candidate for thrombolytic therapy due to the fact that he is way outside the window and artery has evidence of maturing infarct on CT. Code stroke was therefore canceled and he will be admitted for routine stroke evaluation.  Last known well: 07/11/16 NIHSS score: 9 tPA given?: No--way out of the window   SUBJECTIVE (INTERVAL HISTORY) No family members present. The patient states he is yet unable to move his left upper extremity.CT angiogram shows possible high-grade stenosis  distal to the cervical right carotid artery stent. Right cavernous ICA stent appears to be patent   OBJECTIVE Temp:  [97.6 F (36.4 C)-98.7 F (37.1 C)] 97.7 F (36.5 C) (06/10 0854) Pulse Rate:  [77-87] 85 (06/10 0854) Cardiac Rhythm: Normal sinus rhythm (06/10 0700) Resp:  [16-20] 16 (06/10 0854) BP: (173-194)/(61-88) 194/74 (06/10 0854) SpO2:  [95 %-100 %] 100 % (06/10 0854)  CBC:   Recent Labs Lab 07/17/16 1655 07/17/16 1710 07/19/16 0917  WBC 6.1  --  5.1  NEUTROABS 3.2  --   --   HGB 14.9 15.0 15.2  HCT 46.6 44.0 46.5  MCV 93.6  --  91.9  PLT 147*  --  134*    Basic Metabolic Panel:   Recent Labs Lab 07/17/16 1655 07/17/16 1710 07/19/16 0917  NA 145 145 144  K 3.9 3.8 3.5  CL 110 107 110  CO2 27  --  24  GLUCOSE 87 86 156*  BUN 12 13 12   CREATININE 0.96 1.00 0.92  CALCIUM 9.7  --  9.7    Lipid Panel:     Component Value Date/Time   CHOL 155 07/18/2016 0535   TRIG 121 07/18/2016 0535   HDL 36 (L) 07/18/2016 0535   CHOLHDL 4.3 07/18/2016 0535   VLDL 24 07/18/2016 0535   LDLCALC 95 07/18/2016 0535   HgbA1c:  Lab Results  Component Value Date   HGBA1C 5.8 (H) 07/18/2016   Urine Drug Screen:  Component Value Date/Time   LABOPIA NONE DETECTED 07/17/2016 2234   COCAINSCRNUR POSITIVE (A) 07/17/2016 2234   LABBENZ NONE DETECTED 07/17/2016 2234   AMPHETMU NONE DETECTED 07/17/2016 2234   THCU NONE DETECTED 07/17/2016 2234   LABBARB NONE DETECTED 07/17/2016 2234    Alcohol Level     Component Value Date/Time   ETH <5 07/17/2016 2035    IMAGING  Dg Chest 2 View 07/17/2016  Cardiomegaly without evidence of acute cardiopulmonary disease.    Mr / MRA Brain Wo Contrast 07/18/2016 1. Patchy multifocal acute ischemic right MCA territory infarcts, predominantly cortical and posterior right frontal in location, with involvement of the right pre and postcentral gyri. Associated mild petechial hemorrhage without hemorrhagic transformation. Probable  few scattered superimposed more subacute infarcts as above.  2. Moderate chronic microvascular ischemic disease.   MRA HEAD  1. Absent flow void at the level of the right cavernous/supraclinoid stent with distal reconstitution at the right ICA terminus. Patent flow within the right MCA artery and its branches, although somewhat attenuated as compared to the contralateral left. Vascular patency through the stent could further evaluated with CTA as clinically desired.  2. Otherwise negative intracranial MRA.     Ct Head Code Stroke W/o Cm  07/17/2016 1. Posterior right frontal lobe acute/subacute infarct is more prominent than on the study 2 months ago. This may reflect evolution of the previous infarct or a a progressive new infarct.  2. No other acute infarct.  3. ASPECTS is 9/10     PHYSICAL EXAM  Obese middle aged Caucasian male not in distress. . Afebrile. Head is nontraumatic. Neck is supple without bruit.    Cardiac exam no murmur or gallop. Lungs are clear to auscultation. Distal pulses are well felt.  Neurological Exam ;  Awake  Alert oriented x 3. Normal speech and language.eye movements full without nystagmus.fundi were not visualized. Vision acuity and fields appear normal. Hearing is normal. Palatal movements are normal. Face asymmetric with left lower face weakness. Tongue midline. Left hemiparesis with left upper extremity strength  0/5  And left lower extremity 4/5. Normal sensation. Gait deferred.      ASSESSMENT/PLAN [name] is a 66 y.o. male with history of previous strokes, tobacco use, cocaine use, peripheral vascular disease, obstructive sleep apnea, previous right carotid artery stent, coronary artery disease with previous MI, hypertension, hyperlipidemia, history of lower extremity DVT, diabetes mellitus, COPD, congestive heart failure, alcohol abuse, anxiety and depression presenting with left upper extremity weakness.  He did not receive IV t-PA due  to late presentation.   Stroke:   patchy multifocal acute ischemic right MCA territory infarcts - likely embolic from right ICA stent.  Resultant - Plegic left upper extremity  CT head - Posterior right frontal lobe acute/subacute infarct is more prominent than on the study 2 months ago  MRI head - Patchy multifocal acute ischemic right MCA territory infarcts.  MRA head - Absent flow void at the level of the right cavernous/supraclinoid stent with distal reconstitution at the right ICA terminus.  Carotid Doppler -Bilateral 1-39% ICA stenosis, antegrade vertebral flow. Right stent appears patent. Proximal left external carotid artery appears occluded. CT angio : 1. Negative for emergent large vessel occlusion. 2. There are patent vascular stents in both the cervical right ICA and the right ICA siphon. However, the right cervical carotid stent is taper distally and this is suspected to be hemodynamically  significant. No definite intracranial in-stent stenosis identified 2D Echo - Left  ventricle: The cavity size was normal. Wall thickness was   increased in a pattern of mild LVH. Systolic function was normal.    The estimated ejection fraction was in the range of 50% to 55%  LDL - 95  HgbA1c - 5.8  VTE prophylaxis - SCDs DIET DYS 3 Room service appropriate? Yes; Fluid consistency: Thin  No antithrombotic prior to admission, now on aspirin 325 mg daily  Patient counseled to be compliant with his antithrombotic medications  Ongoing aggressive stroke risk factor management  Therapy recommendations: SNF Disposition: SNF Hypertension  Stable  Permissive hypertension (OK if < 220/120) but gradually normalize in 5-7 days  Long-term BP goal normotensive  Hyperlipidemia  Home meds:  Lipitor 40 mg daily not resumed in hospital  LDL 95, goal < 70  Consider increasing Lipitor to 80 mg daily  Continue statin at discharge  Diabetes  HgbA1c pending, goal < 7.0  Unc /  Controlled   Other Stroke Risk Factors  Advanced age  Cigarette smoker - advised to stop smoking  Obesity, Body mass index is 31.51 kg/m., recommend weight loss, diet and exercise as appropriate   Hx stroke/TIA  Coronary artery disease  Obstructive sleep apnea.  Cocaine use   Other Active Problems  Medical noncompliance with medications.  Previous right carotid stent -> CTA Head and Neck  Nonsustained V. tach and AV block -> cardiology evaluating   Mikey Bussing PA-C [facility] Pager [phone] 07/19/2016, 12:04 PM   Hospital day # 2  I have personally examined this patient, reviewed notes, independently viewed imaging studies, participated in medical decision making and plan of care.ROS completed by me personally and pertinent positives fully documented  I have made any additions or clarifications directly to the above note .he presented with progressive left upper extremity and to a lesser degree left lower extremity weakness secondary to right middle cerebral artery infarct possibly related to his right terminal carotid artery stent. He has been noncompliant with his  carotid Doppler and CT angiogram showed patent stent but that appears to be narrowing in the distal aspect of the cervical carotid stent raising concern for hemodynamically significant stenosis. Recommend check cerebral catheter angiogram to evaluate this further. Discussed with family practice resident. Greater than 50% time during this 25 minute visit work spent on counseling and coordination of care about his recurrent stroke or, intracranial spent, need for aggressive risk factor modification and answering questions.  Antony Contras, MD Medical Director [place] Pager: [phone] 07/19/2016 12:04 PM   To contact Stroke Continuity provider, please refer to [email or web address] After hours, contact General Neurology

## 2016-07-20 ENCOUNTER — Inpatient Hospital Stay (HOSPITAL_COMMUNITY): Payer: Medicare Other

## 2016-07-20 HISTORY — PX: IR ANGIO INTRA EXTRACRAN SEL COM CAROTID INNOMINATE BILAT MOD SED: IMG5360

## 2016-07-20 HISTORY — PX: IR ANGIO VERTEBRAL SEL SUBCLAVIAN INNOMINATE UNI R MOD SED: IMG5365

## 2016-07-20 HISTORY — PX: IR ANGIO VERTEBRAL SEL VERTEBRAL UNI L MOD SED: IMG5367

## 2016-07-20 LAB — VAS US CAROTID
LCCADSYS: 58 cm/s
LEFT VERTEBRAL DIAS: -20 cm/s
LICADDIAS: -26 cm/s
LICAPDIAS: -26 cm/s
Left CCA dist dias: 17 cm/s
Left CCA prox dias: 11 cm/s
Left CCA prox sys: 57 cm/s
Left ICA dist sys: -92 cm/s
Left ICA prox sys: -84 cm/s
RCCADSYS: -75 cm/s
RCCAPDIAS: 11 cm/s
RIGHT ECA DIAS: -15 cm/s
RIGHT VERTEBRAL DIAS: -20 cm/s
Right CCA prox sys: 63 cm/s

## 2016-07-20 LAB — CBC
HEMATOCRIT: 43.2 % (ref 39.0–52.0)
Hemoglobin: 14 g/dL (ref 13.0–17.0)
MCH: 29.5 pg (ref 26.0–34.0)
MCHC: 32.4 g/dL (ref 30.0–36.0)
MCV: 91.1 fL (ref 78.0–100.0)
Platelets: 129 10*3/uL — ABNORMAL LOW (ref 150–400)
RBC: 4.74 MIL/uL (ref 4.22–5.81)
RDW: 13.7 % (ref 11.5–15.5)
WBC: 5.9 10*3/uL (ref 4.0–10.5)

## 2016-07-20 LAB — BASIC METABOLIC PANEL
ANION GAP: 5 (ref 5–15)
BUN: 14 mg/dL (ref 6–20)
CALCIUM: 9.5 mg/dL (ref 8.9–10.3)
CO2: 27 mmol/L (ref 22–32)
CREATININE: 0.87 mg/dL (ref 0.61–1.24)
Chloride: 111 mmol/L (ref 101–111)
Glucose, Bld: 116 mg/dL — ABNORMAL HIGH (ref 65–99)
Potassium: 3.8 mmol/L (ref 3.5–5.1)
SODIUM: 143 mmol/L (ref 135–145)

## 2016-07-20 LAB — GLUCOSE, CAPILLARY
GLUCOSE-CAPILLARY: 120 mg/dL — AB (ref 65–99)
GLUCOSE-CAPILLARY: 107 mg/dL — AB (ref 65–99)
Glucose-Capillary: 120 mg/dL — ABNORMAL HIGH (ref 65–99)
Glucose-Capillary: 113 mg/dL — ABNORMAL HIGH (ref 65–99)

## 2016-07-20 MED ORDER — IOPAMIDOL (ISOVUE-300) INJECTION 61%
INTRAVENOUS | Status: AC
  Filled 2016-07-20: qty 100

## 2016-07-20 MED ORDER — LIDOCAINE HCL 1 % IJ SOLN
INTRAMUSCULAR | Status: AC
  Filled 2016-07-20: qty 20

## 2016-07-20 MED ORDER — IOPAMIDOL (ISOVUE-300) INJECTION 61%
INTRAVENOUS | Status: AC
  Filled 2016-07-20: qty 50

## 2016-07-20 MED ORDER — ASPIRIN EC 81 MG PO TBEC
81.0000 mg | DELAYED_RELEASE_TABLET | Freq: Every day | ORAL | Status: DC
  Administered 2016-07-20 – 2016-07-21 (×2): 81 mg via ORAL
  Filled 2016-07-20 (×2): qty 1

## 2016-07-20 MED ORDER — IOPAMIDOL (ISOVUE-300) INJECTION 61%
INTRAVENOUS | Status: AC
  Administered 2016-07-20: 120 mL
  Filled 2016-07-20: qty 50

## 2016-07-20 MED ORDER — HYDRALAZINE HCL 20 MG/ML IJ SOLN
INTRAMUSCULAR | Status: AC
  Filled 2016-07-20: qty 1

## 2016-07-20 MED ORDER — HEPARIN SODIUM (PORCINE) 1000 UNIT/ML IJ SOLN
INTRAMUSCULAR | Status: AC | PRN
  Administered 2016-07-20: 500 [IU] via INTRAVENOUS
  Administered 2016-07-20: 1000 [IU] via INTRAVENOUS

## 2016-07-20 MED ORDER — FENTANYL CITRATE (PF) 100 MCG/2ML IJ SOLN
INTRAMUSCULAR | Status: AC | PRN
  Administered 2016-07-20 (×2): 25 ug via INTRAVENOUS

## 2016-07-20 MED ORDER — MIDAZOLAM HCL 2 MG/2ML IJ SOLN
INTRAMUSCULAR | Status: AC | PRN
  Administered 2016-07-20 (×2): 1 mg via INTRAVENOUS

## 2016-07-20 MED ORDER — HYDRALAZINE HCL 20 MG/ML IJ SOLN
INTRAMUSCULAR | Status: AC | PRN
  Administered 2016-07-20 (×2): 5 mg via INTRAVENOUS

## 2016-07-20 MED ORDER — LIDOCAINE HCL (PF) 1 % IJ SOLN
INTRAMUSCULAR | Status: AC | PRN
  Administered 2016-07-20: 5 mL

## 2016-07-20 MED ORDER — HEPARIN SODIUM (PORCINE) 1000 UNIT/ML IJ SOLN
INTRAMUSCULAR | Status: AC
  Filled 2016-07-20: qty 2

## 2016-07-20 MED ORDER — MIDAZOLAM HCL 2 MG/2ML IJ SOLN
INTRAMUSCULAR | Status: AC
  Filled 2016-07-20: qty 2

## 2016-07-20 MED ORDER — SODIUM CHLORIDE 0.9 % IV SOLN: INTRAVENOUS | Status: AC

## 2016-07-20 MED ORDER — ATORVASTATIN CALCIUM 80 MG PO TABS
80.0000 mg | ORAL_TABLET | Freq: Every day | ORAL | Status: DC
  Administered 2016-07-20: 80 mg via ORAL
  Filled 2016-07-20: qty 1

## 2016-07-20 MED ORDER — FENTANYL CITRATE (PF) 100 MCG/2ML IJ SOLN
INTRAMUSCULAR | Status: AC
  Filled 2016-07-20: qty 2

## 2016-07-20 MED ORDER — IOPAMIDOL (ISOVUE-300) INJECTION 61%
INTRAVENOUS | Status: AC
  Filled 2016-07-20: qty 150

## 2016-07-20 NOTE — Discharge Summary (Signed)
[facility] Discharge Summary  Patient name: [name] Medical record number: [id number] Date of birth: 1950/04/29 Age: 66 y.o. Gender: male Date of Admission: 07/17/2016  Date of Discharge:07/21/2016 Admitting Physician: Zenia Resides, MD  Primary Care Provider: Arnoldo Morale, MD Consultants: Neurology, Cardiology  Indication for Hospitalization: Left hemiparesis and slurred speech  Discharge Diagnoses/Problem List:  Right ischemic right MCA infarct  Disposition: SNF  Discharge Condition: Stable  Discharge Exam:   GEN: appears well, no apparent distress. CVS: RRR, nl s1 &s2, no murmurs, 1+edema bilaterally RESP: speaks in full sentence, no IWOB, good air movement bilaterally, CTAB GI: BS present & normal, soft, NTND GU: no suprapubic or CVA tenderness MSK: no notable swelling, mild tenderness to palpation over his left shoulder anteriorly. But no  SKIN: no apparent skin lesion NEURO: Awake, alert and oriented 4, cranial nerve was intact except for CN XI deficit on the left, motor 1/5 in LUE, 4+/5 in LLE, mildly diminished light sensation in the left upper extremities, patellar reflex 2+ on the left & 1+on the right (history of right knee replacement), finger-to-nose intact on the right, couldn't perform on the left due to weakness.  PSYCH: euthymic mood with congruent affect  Brief Hospital Course:  Kenneth Curenton Burlesonis a 66 y.o.male with a past medical history significant for PAF, HTN, CAD, dCHF, substance use, DVT, gout, T2DM, carotid stenosis s/p stents, COPD, depression and dementia who presented with left hemiparesis and slurred speech and found to have an acute ischemic right MCA territory infarcts.  Patient initially presented with left hemiparesis and slurred speech. CT head and MRI brain showed acute/subacute infarct in right MCA territory. Patient with cervical and intracranial R ICA s/p stent. Carotid doppler showed 1-39%  bilateral occlusion with patent right stent. Neurology was concerned about a high grade stenosis distal to the cervical right carotid artery stent seen on CT angiogram and recommended cerebral catheter angiogram. Cerebral catheter angio showed a 50% intra stent Rt ICA for which  Neurology suggested aggressive medical therapy and risk factor modification. Lipid panel showed chole 155, HDL 36, LDL 95, Trigly 121 and patient was switched from atorvastatin from 40 mg to 80 mg given multiple prior CVA and extensive CAD history. Patient was also started on aspirin 81 mg instead of 325 mg. Patient  A1 was 5.8 and require minimal amount of insulin inpatient. Patient was also found to a 2nd degree AV block that quickly resolved. Cardiology consulted on patient and recommended monitoring. ECHO showed EF of 50-55% w/mild LVH and normal systolic function with P1WC.   Patient had a MOCA score of 21 which is consistent with dementia diagnosis. Drug and tobacco use were addressed and patient was strongly advised to stop given multiple stroke and CAD.    Issues for Follow Up:  1. Patient was started on 80 mg atorvastatin and 81 mg of aspirin. We stopped patient's 325 mg aspirin and 40 mg aspirin. 2. Patient will follow up in stroke clinic in 6 weeks. 3. Smoking cessation and drug abuse counseling. 4. Reinforce medications adherence.  Significant Procedures: Cerebral Catheter Angiogram, ECHO  Significant Labs and Imaging:   Recent Labs Lab 07/17/16 1655 07/17/16 1710 07/19/16 0917 07/20/16 0650  WBC 6.1  --  5.1 5.9  HGB 14.9 15.0 15.2 14.0  HCT 46.6 44.0 46.5 43.2  PLT 147*  --  134* 129*    Recent Labs Lab 07/17/16 1655 07/17/16 1710 07/19/16 0917 07/20/16 0650  NA 145 145 144  143  K 3.9 3.8 3.5 3.8  CL 110 107 110 111  CO2 27  --  24 27  GLUCOSE 87 86 156* 116*  BUN 12 13 12 14   CREATININE 0.96 1.00 0.92 0.87  CALCIUM 9.7  --  9.7 9.5  ALKPHOS 58  --   --   --   AST 16  --   --   --    ALT 12*  --   --   --   ALBUMIN 3.6  --   --   --     Results/Tests Pending at Time of Discharge: None   Discharge Medications:  Allergies as of 07/21/2016      Reactions   Darvocet [propoxyphene N-acetaminophen] Hives   Haldol [haloperidol Decanoate] Hives   Acetaminophen Nausea Only   Upset stomach, tolerates Hydrocodone/APAP if taken with food   Metformin And Related Nausea And Vomiting   Norco [hydrocodone-acetaminophen] Nausea And Vomiting   Tolerates if taken with food      Medication List    TAKE these medications   ACCU-CHEK AVIVA device Use 3 times daily before meals.   accu-chek softclix lancets Use 3 times daily before meals   ACCU-CHEK SOFTCLIX LANCETS lancets Use 3 times daily before meals   albuterol 108 (90 Base) MCG/ACT inhaler Commonly known as:  PROAIR HFA Inhale 2 puffs into the lungs every 6 (six) hours as needed for wheezing or shortness of breath.   albuterol (2.5 MG/3ML) 0.083% nebulizer solution Commonly known as:  PROVENTIL Take 3 mLs (2.5 mg total) by nebulization every 6 (six) hours as needed for wheezing or shortness of breath.   allopurinol 300 MG tablet Commonly known as:  ZYLOPRIM Take 1 tablet (300 mg total) by mouth daily.   aspirin 81 MG EC tablet Take 1 tablet (81 mg total) by mouth daily. Start taking on:  07/22/2016 What changed:  medication strength  how much to take   atorvastatin 80 MG tablet Commonly known as:  LIPITOR Take 1 tablet (80 mg total) by mouth daily at 6 PM. What changed:  medication strength  how much to take   carvedilol 25 MG tablet Commonly known as:  COREG Take 0.5 tablets (12.5 mg total) by mouth 2 (two) times daily with a meal.   clopidogrel 75 MG tablet Commonly known as:  PLAVIX Take 1 tablet (75 mg total) by mouth daily.   colchicine 0.6 MG tablet Take 2 tabs (1.2 mg) at the onset of a gout attack, may repeat 1 tablet (0.6 mg) in one hour if symptoms persist   FLUoxetine 20 MG  tablet Commonly known as:  PROZAC Take 1 tablet (20 mg total) by mouth daily.   furosemide 40 MG tablet Commonly known as:  LASIX Take 1.5 tablets (60 mg total) by mouth 2 (two) times daily.   gabapentin 300 MG capsule Commonly known as:  NEURONTIN Take 2 capsules (600 mg total) by mouth 3 (three) times daily.   glucose blood test strip Commonly known as:  ACCU-CHEK AVIVA Use 3 times daily before meals as directed.   hydrALAZINE 50 MG tablet Commonly known as:  APRESOLINE Take 50 mg by mouth daily.   HYDROcodone-acetaminophen 5-325 MG tablet Commonly known as:  NORCO/VICODIN Take 1 tablet by mouth every 6 (six) hours as needed.   lactulose 10 GM/15ML solution Commonly known as:  CHRONULAC Take 15 mLs (10 g total) by mouth 2 (two) times daily as needed for mild constipation.   lisinopril 10  MG tablet Commonly known as:  PRINIVIL,ZESTRIL Take 1 tablet (10 mg total) by mouth daily.   memantine 10 MG tablet Commonly known as:  NAMENDA Take 1 tablet (10 mg total) by mouth 2 (two) times daily.   pantoprazole 40 MG tablet Commonly known as:  PROTONIX Take 1 tablet (40 mg total) by mouth every morning.   polyethylene glycol packet Commonly known as:  MIRALAX / GLYCOLAX Take 17 g by mouth daily. Start taking on:  07/22/2016   ROLLATOR Misc 1 each by Does not apply route daily.   tiZANidine 4 MG tablet Commonly known as:  ZANAFLEX Take 1 tablet (4 mg total) by mouth every 8 (eight) hours as needed for muscle spasms.   traZODone 50 MG tablet Commonly known as:  DESYREL Take 1 tablet (50 mg total) by mouth at bedtime as needed for sleep.   triamcinolone cream 0.1 % Commonly known as:  KENALOG Apply 1 application topically daily as needed (for dermatitis).       Discharge Instructions: Please refer to Patient Instructions section of EMR for full details.  Patient was counseled important signs and symptoms that should prompt return to medical care, changes in  medications, dietary instructions, activity restrictions, and follow up appointments.   Follow-Up Appointments: Patient will make appointment for follow up with neurology/stroke clinic in 6 weeks.  Marjie Skiff, MD 07/21/2016, 12:59 PM PGY-1, [place]

## 2016-07-20 NOTE — Progress Notes (Signed)
STROKE TEAM PROGRESS NOTE   HISTORY OF PRESENT ILLNESS (per record) This is a 66 year old right-handed man who was brought to the emergency department by his primary care aide. History is obtained from the patient. His gait is present at the bedside and offers additional information as needed.  The patient reports that he developed weakness in his left arm on 07/11/16. This weakness has been present ever since. He describes difficulty moving his left arm and says that he was unable to hold onto anything with his left hand. He states that he did not seek medical attention at that time because he "had other things to do." Today, he noticed worsening of the weakness in his left arm, and now is unable to move it at all. In addition, he has noticed some increasing weakness in his left leg today as well as some slurred speech. His healthcare aide reports that she noticed he was having difficulty walking and not why she had him brought to the emergency department.  The patient reports that he is supposed be taking a baby aspirin every day but he does not. He says he only takes when he feels like he needs it which is not very often. He continues to smoke. He tells me that he smokes crack and he last used crack cocaine yesterday. He has a history of 3 prior strokes.  The patient was taken for an emergent CT scan of the head which showed evidence of an acute to subacute right frontal lobe infarction without hemorrhage. He is not a candidate for thrombolytic therapy due to the fact that he is way outside the window and artery has evidence of maturing infarct on CT. Code stroke was therefore canceled and he will be admitted for routine stroke evaluation.  Last known well: 07/11/16 NIHSS score: 9 tPA given?: No--way out of the window   SUBJECTIVE (INTERVAL HISTORY) No family members present. The patient states his left upper extremity strength is slightly improved. Cerebral catheter angiogram shows only 20%  stenosis in distal the right side cervical carotid spent and 50% stenosis in intracranial right carotid stent. Discussed with Dr. Estanislado Pandy not the interventional candidate at present time  OBJECTIVE Temp:  [97.7 F (36.5 C)-98.6 F (37 C)] 98.6 F (37 C) (06/11 1519) Pulse Rate:  [51-98] 89 (06/11 1705) Cardiac Rhythm: Normal sinus rhythm (06/11 1145) Resp:  [15-25] 15 (06/11 1705) BP: (138-197)/(65-92) 149/72 (06/11 1705) SpO2:  [93 %-100 %] 100 % (06/11 1705) Weight:  [228 lb 1.6 oz (103.5 kg)] 228 lb 1.6 oz (103.5 kg) (06/11 0556)  CBC:   Recent Labs Lab 07/17/16 1655  07/19/16 0917 07/20/16 0650  WBC 6.1  --  5.1 5.9  NEUTROABS 3.2  --   --   --   HGB 14.9  < > 15.2 14.0  HCT 46.6  < > 46.5 43.2  MCV 93.6  --  91.9 91.1  PLT 147*  --  134* 129*  < > = values in this interval not displayed.  Basic Metabolic Panel:   Recent Labs Lab 07/19/16 0917 07/20/16 0650  NA 144 143  K 3.5 3.8  CL 110 111  CO2 24 27  GLUCOSE 156* 116*  BUN 12 14  CREATININE 0.92 0.87  CALCIUM 9.7 9.5    Lipid Panel:     Component Value Date/Time   CHOL 155 07/18/2016 0535   TRIG 121 07/18/2016 0535   HDL 36 (L) 07/18/2016 0535   CHOLHDL 4.3 07/18/2016 0535  VLDL 24 07/18/2016 0535   LDLCALC 95 07/18/2016 0535   HgbA1c:  Lab Results  Component Value Date   HGBA1C 5.8 (H) 07/18/2016   Urine Drug Screen:     Component Value Date/Time   LABOPIA NONE DETECTED 07/17/2016 2234   COCAINSCRNUR POSITIVE (A) 07/17/2016 2234   LABBENZ NONE DETECTED 07/17/2016 2234   AMPHETMU NONE DETECTED 07/17/2016 2234   THCU NONE DETECTED 07/17/2016 2234   LABBARB NONE DETECTED 07/17/2016 2234    Alcohol Level     Component Value Date/Time   ETH <5 07/17/2016 2035    IMAGING  Dg Chest 2 View 07/17/2016  Cardiomegaly without evidence of acute cardiopulmonary disease.    Mr / MRA Brain Wo Contrast 07/18/2016 1. Patchy multifocal acute ischemic right MCA territory infarcts, predominantly  cortical and posterior right frontal in location, with involvement of the right pre and postcentral gyri. Associated mild petechial hemorrhage without hemorrhagic transformation. Probable few scattered superimposed more subacute infarcts as above.  2. Moderate chronic microvascular ischemic disease.   MRA HEAD  1. Absent flow void at the level of the right cavernous/supraclinoid stent with distal reconstitution at the right ICA terminus. Patent flow within the right MCA artery and its branches, although somewhat attenuated as compared to the contralateral left. Vascular patency through the stent could further evaluated with CTA as clinically desired.  2. Otherwise negative intracranial MRA.     Ct Head Code Stroke W/o Cm  07/17/2016 1. Posterior right frontal lobe acute/subacute infarct is more prominent than on the study 2 months ago. This may reflect evolution of the previous infarct or a a progressive new infarct.  2. No other acute infarct.  3. ASPECTS is 9/10     PHYSICAL EXAM  Obese middle aged Caucasian male not in distress. . Afebrile. Head is nontraumatic. Neck is supple without bruit.    Cardiac exam no murmur or gallop. Lungs are clear to auscultation. Distal pulses are well felt.  Neurological Exam ;  Awake  Alert oriented x 3. Normal speech and language.eye movements full without nystagmus.fundi were not visualized. Vision acuity and fields appear normal. Hearing is normal. Palatal movements are normal. Face asymmetric with left lower face weakness. Tongue midline. Left hemiparesis with left upper extremity strength  1/5  And left lower extremity 4/5. Normal sensation. Gait deferred.      ASSESSMENT/PLAN [name] is a 65 y.o. male with history of previous strokes, tobacco use, cocaine use, peripheral vascular disease, obstructive sleep apnea, previous right carotid artery stent, coronary artery disease with previous MI, hypertension, hyperlipidemia, history of  lower extremity DVT, diabetes mellitus, COPD, congestive heart failure, alcohol abuse, anxiety and depression presenting with left upper extremity weakness.  He did not receive IV t-PA due to late presentation.   Stroke:   patchy multifocal acute ischemic right MCA territory infarcts - likely embolic from right ICA stent.  Resultant - Plegic left upper extremity  CT head - Posterior right frontal lobe acute/subacute infarct is more prominent than on the study 2 months ago  MRI head - Patchy multifocal acute ischemic right MCA territory infarcts.  MRA head - Absent flow void at the level of the right cavernous/supraclinoid stent with distal reconstitution at the right ICA terminus.  Carotid Doppler -Bilateral 1-39% ICA stenosis, antegrade vertebral flow. Right stent appears patent. Proximal left external carotid artery appears occluded. CT angio : 1. Negative for emergent large vessel occlusion. 2. There are patent vascular stents in both the cervical  right ICA and the right ICA siphon. However, the right cervical carotid stent is taper distally and this is suspected to be hemodynamically  significant. No definite intracranial in-stent stenosis identified Cerebral catheter angio  07/20/16 : ,1. Approx 50- % intrastent stenosis RT ICA cavernouis seg. 2. approx 20 % stenosis distal portion of  RT ICA, prox stent. 3 .50 % stenosis at origin of innominate artery  .4. Approx 40  To 50 % stenosis Lt ICA supraclinoid seg. 2D Echo - Left ventricle: The cavity size was normal. Wall thickness was   increased in a pattern of mild LVH. Systolic function was normal.    The estimated ejection fraction was in the range of 50% to 55%  LDL - 95  HgbA1c - 5.8  VTE prophylaxis - SCDs Diet Carb Modified Fluid consistency: Thin; Room service appropriate? Yes  No antithrombotic prior to admission, now on aspirin 325 mg daily  Patient counseled to be compliant with his antithrombotic  medications  Ongoing aggressive stroke risk factor management  Therapy recommendations: SNF Disposition: SNF Hypertension  Stable  Permissive hypertension (OK if < 220/120) but gradually normalize in 5-7 days  Long-term BP goal normotensive  Hyperlipidemia  Home meds:  Lipitor 40 mg daily not resumed in hospital  LDL 95, goal < 70  Consider increasing Lipitor to 80 mg daily  Continue statin at discharge  Diabetes  HgbA1c pending, goal < 7.0  Unc / Controlled   Other Stroke Risk Factors  Advanced age  Cigarette smoker - advised to stop smoking  Obesity, Body mass index is 30.94 kg/m., recommend weight loss, diet and exercise as appropriate   Hx stroke/TIA  Coronary artery disease  Obstructive sleep apnea.  Cocaine use   Other Active Problems  Medical noncompliance with medications.  Previous right carotid stent -> CTA Head and Neck  Nonsustained V. tach and AV block -> cardiology evaluating   Mikey Bussing PA-C [facility] Pager [phone] 07/20/2016, 6:05 PM   Hospital day # 3  I have personally examined this patient, reviewed notes, independently viewed imaging studies, participated in medical decision making and plan of care.ROS completed by me personally and pertinent positives fully documented  I have made any additions or clarifications directly to the above note .he presented with progressive left upper extremity and to a lesser degree left lower extremity weakness secondary to right middle cerebral artery infarct possibly related to his right terminal carotid artery stent. He has been noncompliant with his  medical therapy which has likely resu alted in his intrastent restenosis   Discussed with patient and Dr Estanislado Pandy. I recommend aggressive medical therapy and risk factor modification. Followup in the stroke clinic in 6 weeks. Greater than 50% time during this 25 minute visit work spent on counseling and coordination of care  about his recurrent stroke or, intracranial spent, need for aggressive risk factor modification and answering questions.  Antony Contras, MD Medical Director [facility] Pager: [phone] 07/20/2016 6:05 PM   To contact Stroke Continuity provider, please refer to [email or web address] After hours, contact General Neurology

## 2016-07-20 NOTE — Consult Note (Signed)
Chief Complaint: Patient was seen in consultation today for cerebral arteriogram Chief Complaint  Patient presents with  . Hypertension  . Code Stroke   at the request of Dr Royal Hawthorn  Referring Physician(s): Dr Royal Hawthorn  Supervising Physician: Luanne Bras  Patient Status: [facility] - In-pt  History of Present Illness: [name] is a 66 y.o. male   Weakness of left arm 07/11/16----increasing weakness since Health care aid noted left leg weakness 6/8 and presented to ED Not compliant with ASA daily per pt. Continues to smoke Smokes crack and admits to doing so just 07/17/16.  Work up does show new CVA  Mr / MRA Brain Wo Contrast 07/18/2016 1. Patchy multifocal acute ischemic right MCA territory infarcts, predominantly cortical and posterior right frontal in location, with involvement of the right pre and postcentral gyri. Associated mild petechial hemorrhage without hemorrhagic transformation. Probable few scattered superimposed more subacute infarcts as above.  2. Moderate chronic microvascular ischemic disease.   MRA HEAD  1. Absent flow void at the level of the right cavernous/supraclinoid stent with distal reconstitution at the right ICA terminus. Patent flow within the right MCA artery and its branches, although somewhat attenuated as compared to the contralateral left. Vascular patency through the stent could further evaluated with CTA as clinically desired.  2. Otherwise negative intracranial MRA.   Ct Head Code Stroke W/o Cm  07/17/2016 1. Posterior right frontal lobe acute/subacute infarct is more prominent than on the study 2 months ago. This may reflect evolution of the previous infarct or a a progressive new infarct.  2. No other acute infarct.  3. ASPECTS is 9/10   Neuro has seen and evaluated pt Recommending cerebral arteriogram for full evaluation of specifically right carotid stent and stenosis Scheduled for procedure now in IR  Past Medical History:    Diagnosis Date  . Alcohol abuse    H/O  . Anxiety   . Arthritis   . Asthma   . CAD (coronary artery disease) 05/27/10   Cath: severe single vessell CAD left cx midportion obtuse marginal 2 to 3.  [name] CHF (congestive heart failure) ([place])   . COPD (chronic obstructive pulmonary disease) ([place])   . COPD (chronic obstructive pulmonary disease) ([place])   . Depression   . Diabetes mellitus, type 2 ([place]) 03/13/2015  . GERD (gastroesophageal reflux disease)   . HCAP (healthcare-associated pneumonia) 10/15/2015  . History of DVT of lower extremity   . Hyperlipemia   . Hyperlipidemia   . Hypertension   . Myocardial infarction ([place]) 2000  . Orbital fracture ([place]) 12/2012  . OSA on CPAP   . Peripheral vascular disease, unspecified ([place])    08/20/10 doppler: increase in right ABI post-op. Left ABI stable. S/P bi-fem bypass surgery  . Pneumonia 04/04/2012  . Shortness of breath   . Stroke ([place])   . Tobacco abuse     Past Surgical History:  Procedure Laterality Date  . abdoninal ao angio & bifem angio  05/27/10   Patent graft, occluded bil stents with no retrograde flow into the hypogastric arteries. 100% occl left ant. tibial artery, 70% to 80% to 100% stenosis right superficial fem artery above adductor canal. 100 % occl right ant. tibial vessell  . Aortogram w/ PTCA  09/292003  . BACK SURGERY    . CARDIAC CATHETERIZATION  05/27/10   severe CAD left cx  . CHOLECYSTECTOMY    . ESOPHAGOGASTRIC FUNDOPLICATION    . EYE SURGERY    .  FEMORAL BYPASS  08/19/10   Right Fem-Pop  . IR RADIOLOGIST EVAL & MGMT  05/27/2016  . JOINT REPLACEMENT    . PERIPHERAL VASCULAR CATHETERIZATION N/A 12/10/2014   Procedure: Abdominal Aortogram;  Surgeon: Angelia Mould, MD;  Location: [facility];  Service: Cardiovascular;  Laterality: N/A;  . RADIOLOGY WITH ANESTHESIA N/A 02/08/2015   Procedure: RADIOLOGY WITH ANESTHESIA;  Surgeon: Luanne Bras, MD;  Location: [facility];  Service: Radiology;   Laterality: N/A;  . TEE WITHOUT CARDIOVERSION N/A 08/29/2014   Procedure: TRANSESOPHAGEAL ECHOCARDIOGRAM (TEE);  Surgeon: Josue Hector, MD;  Location: [facility] ENDOS[facility];  Service: Cardiovascular;  Laterality: N/A;  . TOTAL KNEE ARTHROPLASTY      Allergies: Darvocet [propoxyphene n-acetaminophen]; Haldol [haloperidol decanoate]; Acetaminophen; Metformin and related; and Norco [hydrocodone-acetaminophen]  Medications: Prior to Admission medications   Medication Sig Start Date End Date Taking? Authorizing Provider  ACCU-CHEK SOFTCLIX LANCETS lancets Use 3 times daily before meals 09/17/15  Yes Amao, Charlane Ferretti, MD  albuterol (PROAIR HFA) 108 (90 Base) MCG/ACT inhaler Inhale 2 puffs into the lungs every 6 (six) hours as needed for wheezing or shortness of breath. 04/28/16  Yes Amao, Charlane Ferretti, MD  albuterol (PROVENTIL) (2.5 MG/3ML) 0.083% nebulizer solution Take 3 mLs (2.5 mg total) by nebulization every 6 (six) hours as needed for wheezing or shortness of breath. 04/28/16  Yes Arnoldo Morale, MD  allopurinol (ZYLOPRIM) 300 MG tablet Take 1 tablet (300 mg total) by mouth daily. 04/28/16  Yes Arnoldo Morale, MD  aspirin 325 MG EC tablet Take 1 tablet (325 mg total) by mouth daily. 01/16/15  Yes Arnoldo Morale, MD  atorvastatin (LIPITOR) 40 MG tablet Take 1 tablet (40 mg total) by mouth daily at 6 PM. 04/28/16  Yes Amao, Enobong, MD  Blood Glucose Monitoring Suppl (ACCU-CHEK AVIVA) device Use 3 times daily before meals. 03/13/15  Yes Arnoldo Morale, MD  carvedilol (COREG) 25 MG tablet Take 0.5 tablets (12.5 mg total) by mouth 2 (two) times daily with a meal. 06/01/16  Yes Amao, Charlane Ferretti, MD  clopidogrel (PLAVIX) 75 MG tablet Take 1 tablet (75 mg total) by mouth daily. 04/28/16  Yes Arnoldo Morale, MD  colchicine 0.6 MG tablet Take 2 tabs (1.2 mg) at the onset of a gout attack, may repeat 1 tablet (0.6 mg) in one hour if symptoms persist 10/16/15  Yes Molt, Bethany, DO  FLUoxetine (PROZAC) 20 MG tablet Take 1 tablet (20 mg  total) by mouth daily. 04/28/16  Yes Arnoldo Morale, MD  furosemide (LASIX) 40 MG tablet Take 1.5 tablets (60 mg total) by mouth 2 (two) times daily. 04/28/16  Yes Arnoldo Morale, MD  gabapentin (NEURONTIN) 300 MG capsule Take 2 capsules (600 mg total) by mouth 3 (three) times daily. 04/28/16  Yes Arnoldo Morale, MD  glucose blood (ACCU-CHEK AVIVA) test strip Use 3 times daily before meals as directed. 01/29/16  Yes Arnoldo Morale, MD  hydrALAZINE (APRESOLINE) 50 MG tablet Take 50 mg by mouth daily.  06/18/16  Yes [provider]  HYDROcodone-acetaminophen (NORCO/VICODIN) 5-325 MG tablet Take 1 tablet by mouth every 6 (six) hours as needed. 06/18/16  Yes [provider]  lactulose (CHRONULAC) 10 GM/15ML solution Take 15 mLs (10 g total) by mouth 2 (two) times daily as needed for mild constipation. 04/28/16  Yes Arnoldo Morale, MD  Lancet Devices [facility]) lancets Use 3 times daily before meals 09/27/15  Yes Amao, Enobong, MD  lisinopril (PRINIVIL,ZESTRIL) 10 MG tablet Take 1 tablet (10 mg total) by mouth daily.  04/28/16  Yes Arnoldo Morale, MD  memantine (NAMENDA) 10 MG tablet Take 1 tablet (10 mg total) by mouth 2 (two) times daily. 04/28/16  Yes Arnoldo Morale, MD  Misc. Devices (ROLLATOR) MISC 1 each by Does not apply route daily. 02/27/15  Yes Arnoldo Morale, MD  pantoprazole (PROTONIX) 40 MG tablet Take 1 tablet (40 mg total) by mouth every morning. 04/28/16  Yes Arnoldo Morale, MD  tiZANidine (ZANAFLEX) 4 MG tablet Take 1 tablet (4 mg total) by mouth every 8 (eight) hours as needed for muscle spasms. 04/28/16  Yes Arnoldo Morale, MD  traZODone (DESYREL) 50 MG tablet Take 1 tablet (50 mg total) by mouth at bedtime as needed for sleep. 04/28/16  Yes Arnoldo Morale, MD  triamcinolone cream (KENALOG) 0.1 % Apply 1 application topically daily as needed (for dermatitis). 06/02/16  Yes Arnoldo Morale, MD     Family History  Problem Relation Age of Onset  . Heart attack Mother   . Heart failure  Mother   . Cirrhosis Father   . Heart failure Brother   . Cancer Brother   . Hypertension Neg Hx        UNKNOWN  . Stroke Neg Hx        UNKNOWN    Social History   Social History  . Marital status: Single    Spouse name: N/A  . Number of children: N/A  . Years of education: N/A   Social History Main Topics  . Smoking status: Current Every Day Smoker    Packs/day: 1.00    Years: 50.00    Types: Cigars, Cigarettes    Start date: 02/09/1961  . Smokeless tobacco: Former Systems developer    Types: Chew     Comment: 2 ppd, full flavor  . Alcohol use No  . Drug use: No  . Sexual activity: Yes   Other Topics Concern  . None   Social History Narrative   Recovering alcoholic    Review of Systems: A 12 point ROS discussed and pertinent positives are indicated in the HPI above.  All other systems are negative.  Review of Systems  Constitutional: Positive for activity change. Negative for appetite change, fatigue and fever.  HENT: Negative for trouble swallowing and voice change.   Eyes: Negative for visual disturbance.  Respiratory: Positive for cough.   Gastrointestinal: Negative for abdominal pain.  Musculoskeletal: Positive for gait problem. Negative for back pain.  Neurological: Positive for weakness and numbness. Negative for dizziness, tremors, seizures, syncope, facial asymmetry, speech difficulty, light-headedness and headaches.  Psychiatric/Behavioral: Negative for behavioral problems and confusion.    Vital Signs: BP (!) 160/74 (BP Location: Left Arm)   Pulse 78   Temp 98.1 F (36.7 C) (Oral)   Resp 16   Ht 6' (1.829 m)   Wt 228 lb 1.6 oz (103.5 kg)   SpO2 96%   BMI 30.94 kg/m   Physical Exam  Constitutional: He is oriented to person, place, and time. He appears well-nourished.  HENT:  Head: Atraumatic.  Eyes: EOM are normal.  Neck: Neck supple.  Cardiovascular: Normal rate and regular rhythm.   Pulmonary/Chest: Effort normal. He has wheezes.  Abdominal: Soft.  Bowel sounds are normal. There is no tenderness.  Musculoskeletal: Normal range of motion.  Left sided arm and leg weakness  Neurological: He is alert and oriented to person, place, and time.  Skin: Skin is warm and dry.  Psychiatric: He has a normal mood and affect. His behavior is normal. Judgment and  thought content normal.  Nursing note and vitals reviewed.   Mallampati Score:  MD Evaluation Airway: WNL Heart: WNL Abdomen: WNL Chest/ Lungs: WNL ASA  Classification: 3 Mallampati/Airway Score: Two  Imaging: Ct Angio Head W Or Wo Contrast  Result Date: 07/18/2016 CLINICAL DATA:  66 year old male with patchy right MCA ischemia detected on MRI performed for code stroke presentation with left side weakness. Right ICA siphon stent. EXAM: CT ANGIOGRAPHY HEAD AND NECK TECHNIQUE: Multidetector CT imaging of the head and neck was performed using the standard protocol during bolus administration of intravenous contrast. Multiplanar CT image reconstructions and MIPs were obtained to evaluate the vascular anatomy. Carotid stenosis measurements (when applicable) are obtained utilizing NASCET criteria, using the distal internal carotid diameter as the denominator. CONTRAST:  50 mL Isovue 370 COMPARISON:  Brain MRI and intracranial MRA 07/17/2016 FINDINGS: CTA NECK Skeleton: Absent dentition. Previous left orbital floor and left maxilla repair. Cervical spine degeneration with C2-C3 ankylosis. No acute osseous abnormality identified. Aside from the left maxillary, paranasal sinuses and mastoids are stable and well pneumatized. Upper chest: Mild scarring and dependent atelectasis in the visible lung parenchyma. Small volume pericardial fluid in the superior pericardial recess. No superior mediastinal lymphadenopathy. Other neck: Incidental intravenous gas occasionally noted in the neck, most prominently in an anterior thyroidal vein on the left. Otherwise negative. No cervical lymphadenopathy. Aortic arch:  Extensive soft and calcified atherosclerosis in the aortic arch. Areas of ulcerated plaque in the distal arch (series 8, image 147). Right carotid system: 40-50% stenosis at the proximal brachiocephalic artery related to soft and calcified plaque (series 8, image 124). No significant right CCA origin stenosis despite soft plaque. There is a distal right CCA through proximal right ICA vascular stent in place which is patent, but is tapered distally which appears to result in the degree of circumferential stenosis (series 5, image 82 and series 8, image 123). No other in stent stenosis is evident. Distal to this the cervical right ICA is patent to the skullbase with only minimal calcified plaque. Left carotid system: No left CCA origin stenosis despite soft and calcified plaque. Intermittent soft plaque in the left CCA without stenosis. At the left carotid bifurcation the left ECA origin is occluded. There is soft and calcified plaque in the left ICA origin and bulb resulting in a tapered appearance of the vessel, but no focal hemodynamically significant stenosis. The left ICA remains patent to the skullbase. Vertebral arteries: Soft and calcified plaque in the proximal right subclavian artery does not appear hemodynamically significant. Calcified plaque at the right vertebral artery origin and right V1 segment. The right vertebral artery is diminutive, such that there could be up to moderate stenosis. The left vertebral artery remains patent to the neck with other intermittent right V2 and V3 segment calcified plaque. No significant proximal left subclavian artery stenosis despite abundant calcified plaque. Calcified plaque at the left vertebral artery origin with mild stenosis. Intermittent left vertebral artery V2 and V3 segment calcified plaque without significant stenosis. The left PICA origin is patent and arises just proximal to the dura at the C1 level. CTA HEAD Posterior circulation: Patent in fairly  codominant distal vertebral arteries without additional vertebral artery stenosis. Patent vertebrobasilar junction without stenosis. No basilar artery stenosis. SCA and PCA origins are patent. Posterior communicating arteries are diminutive or absent. There is mild bilateral P2 segment irregularity. PCA branches are otherwise within normal limits. Anterior circulation: The right ICA siphon stent from the cavernous to the supraclinoid segment  appears patent. No definite in stent stenosis. The left ICA siphon is patent with moderate calcified plaque in the cavernous segment. No hemodynamically significant stenosis. Patent carotid termini. Normal MCA origins. Dominant left A1 segment. The right A1 is patent. Bilateral ACA branches are within normal limits. Left MCA M1 segment, bifurcation, and left MCA branches are within normal limits. Right MCA M1 segment, and right MCA bifurcation are patent. No right MCA branch occlusion is identified, and the right MCA branches appear more symmetric to the left side on CTA today versus the recent MRA. Venous sinuses: Patent. Anatomic variants: None. Delayed phase: No abnormal enhancement identified. Stable hypodensity at the superior and lateral right peri-Rolandic cortex corresponding to the diffusion abnormality on MRI. No intracranial hemorrhage or mass effect. Review of the MIP images confirms the above findings IMPRESSION: 1. Negative for emergent large vessel occlusion. 2. There are patent vascular stents in both the cervical right ICA and the right ICA siphon. However, the right cervical carotid stent is taper distally and this is suspected to be hemodynamically significant. No definite intracranial in-stent stenosis identified. 3. Underlying widespread atherosclerosis of the aortic arch and head and neck vessels. 4. Ulcerated soft plaque and calcified plaque in the distal arch. Plaque in the proximal brachiocephalic artery which is approaching hemodynamically significance.  5. No significant left cervical carotid or left ICA stenosis despite plaque. Incidental left EXTERNAL carotid artery occlusion at its origin. 6. Multifocal moderate right vertebral artery stenosis at its origin and V1 segment. Mild left vertebral artery origin stenosis. 7. Stable CT appearance of the brain since yesterday. Electronically Signed   By: Genevie Ann M.D.   On: 07/18/2016 13:35   Dg Chest 2 View  Result Date: 07/17/2016 CLINICAL DATA:  Chest pain.  Recent stroke. EXAM: CHEST  2 VIEW COMPARISON:  05/29/2016 and prior radiographs FINDINGS: Cardiomegaly again noted. There is no evidence of focal airspace disease, pulmonary edema, suspicious pulmonary nodule/mass, pleural effusion, or pneumothorax. No acute bony abnormalities are identified. IMPRESSION: Cardiomegaly without evidence of acute cardiopulmonary disease. Electronically Signed   By: Margarette Canada M.D.   On: 07/17/2016 18:36   Ct Angio Neck W Or Wo Contrast  Result Date: 07/18/2016 CLINICAL DATA:  66 year old male with patchy right MCA ischemia detected on MRI performed for code stroke presentation with left side weakness. Right ICA siphon stent. EXAM: CT ANGIOGRAPHY HEAD AND NECK TECHNIQUE: Multidetector CT imaging of the head and neck was performed using the standard protocol during bolus administration of intravenous contrast. Multiplanar CT image reconstructions and MIPs were obtained to evaluate the vascular anatomy. Carotid stenosis measurements (when applicable) are obtained utilizing NASCET criteria, using the distal internal carotid diameter as the denominator. CONTRAST:  50 mL Isovue 370 COMPARISON:  Brain MRI and intracranial MRA 07/17/2016 FINDINGS: CTA NECK Skeleton: Absent dentition. Previous left orbital floor and left maxilla repair. Cervical spine degeneration with C2-C3 ankylosis. No acute osseous abnormality identified. Aside from the left maxillary, paranasal sinuses and mastoids are stable and well pneumatized. Upper chest:  Mild scarring and dependent atelectasis in the visible lung parenchyma. Small volume pericardial fluid in the superior pericardial recess. No superior mediastinal lymphadenopathy. Other neck: Incidental intravenous gas occasionally noted in the neck, most prominently in an anterior thyroidal vein on the left. Otherwise negative. No cervical lymphadenopathy. Aortic arch: Extensive soft and calcified atherosclerosis in the aortic arch. Areas of ulcerated plaque in the distal arch (series 8, image 147). Right carotid system: 40-50% stenosis at the proximal brachiocephalic artery  related to soft and calcified plaque (series 8, image 124). No significant right CCA origin stenosis despite soft plaque. There is a distal right CCA through proximal right ICA vascular stent in place which is patent, but is tapered distally which appears to result in the degree of circumferential stenosis (series 5, image 82 and series 8, image 123). No other in stent stenosis is evident. Distal to this the cervical right ICA is patent to the skullbase with only minimal calcified plaque. Left carotid system: No left CCA origin stenosis despite soft and calcified plaque. Intermittent soft plaque in the left CCA without stenosis. At the left carotid bifurcation the left ECA origin is occluded. There is soft and calcified plaque in the left ICA origin and bulb resulting in a tapered appearance of the vessel, but no focal hemodynamically significant stenosis. The left ICA remains patent to the skullbase. Vertebral arteries: Soft and calcified plaque in the proximal right subclavian artery does not appear hemodynamically significant. Calcified plaque at the right vertebral artery origin and right V1 segment. The right vertebral artery is diminutive, such that there could be up to moderate stenosis. The left vertebral artery remains patent to the neck with other intermittent right V2 and V3 segment calcified plaque. No significant proximal left  subclavian artery stenosis despite abundant calcified plaque. Calcified plaque at the left vertebral artery origin with mild stenosis. Intermittent left vertebral artery V2 and V3 segment calcified plaque without significant stenosis. The left PICA origin is patent and arises just proximal to the dura at the C1 level. CTA HEAD Posterior circulation: Patent in fairly codominant distal vertebral arteries without additional vertebral artery stenosis. Patent vertebrobasilar junction without stenosis. No basilar artery stenosis. SCA and PCA origins are patent. Posterior communicating arteries are diminutive or absent. There is mild bilateral P2 segment irregularity. PCA branches are otherwise within normal limits. Anterior circulation: The right ICA siphon stent from the cavernous to the supraclinoid segment appears patent. No definite in stent stenosis. The left ICA siphon is patent with moderate calcified plaque in the cavernous segment. No hemodynamically significant stenosis. Patent carotid termini. Normal MCA origins. Dominant left A1 segment. The right A1 is patent. Bilateral ACA branches are within normal limits. Left MCA M1 segment, bifurcation, and left MCA branches are within normal limits. Right MCA M1 segment, and right MCA bifurcation are patent. No right MCA branch occlusion is identified, and the right MCA branches appear more symmetric to the left side on CTA today versus the recent MRA. Venous sinuses: Patent. Anatomic variants: None. Delayed phase: No abnormal enhancement identified. Stable hypodensity at the superior and lateral right peri-Rolandic cortex corresponding to the diffusion abnormality on MRI. No intracranial hemorrhage or mass effect. Review of the MIP images confirms the above findings IMPRESSION: 1. Negative for emergent large vessel occlusion. 2. There are patent vascular stents in both the cervical right ICA and the right ICA siphon. However, the right cervical carotid stent is taper  distally and this is suspected to be hemodynamically significant. No definite intracranial in-stent stenosis identified. 3. Underlying widespread atherosclerosis of the aortic arch and head and neck vessels. 4. Ulcerated soft plaque and calcified plaque in the distal arch. Plaque in the proximal brachiocephalic artery which is approaching hemodynamically significance. 5. No significant left cervical carotid or left ICA stenosis despite plaque. Incidental left EXTERNAL carotid artery occlusion at its origin. 6. Multifocal moderate right vertebral artery stenosis at its origin and V1 segment. Mild left vertebral artery origin stenosis. 7. Stable CT  appearance of the brain since yesterday. Electronically Signed   By: Genevie Ann M.D.   On: 07/18/2016 13:35   Mr Brain Wo Contrast  Result Date: 07/18/2016 CLINICAL DATA:  Initial evaluation for left hemi paresis, slurred speech. History of prior proximal right ICA and supraclinoid stenting. EXAM: MRI HEAD WITHOUT CONTRAST MRA HEAD WITHOUT CONTRAST TECHNIQUE: Multiplanar, multiecho pulse sequences of the brain and surrounding structures were obtained without intravenous contrast. Angiographic images of the head were obtained using MRA technique without contrast. COMPARISON:  Prior CT earlier the same day as well as previous MRI from 02/06/2015. FINDINGS: MRI HEAD FINDINGS Brain: Study moderately degraded by motion artifact. Generalized age related cerebral atrophy. Patchy and confluent T2/FLAIR hyperintensity within the periventricular and deep white matter both cerebral hemispheres most consistent with chronic small vessel ischemic disease. Overall, changes are moderate in nature. There is patchy predominantly cortical abnormal restricted diffusion involving the right frontoparietal region, with involvement of the pre and postcentral gyrus (series 9, image 46). Additional patchy cortical involvement more anteriorly within the right frontal lobe (series 9, image 43). Few  small foci within the periventricular and subcortical white matter of the right frontal and parietal lobes (series 9, image 34). These are acute in nature with corresponding signal loss on ADC map. Probable trace petechial hemorrhage within a few areas of infarction at the posterior right frontal region (series 11, image 24, 23). Few more vague foci of diffusion abnormality without definite ADC correlate within the right frontal lobe (series 9, image 37), right periatrial white matter (series 9, image 25), and right parietal lobe (series 9, image 31) are suspected to be more subacute in nature. No other evidence for acute ischemia. Few scattered foci of chronic microhemorrhage at the parasagittal right parietal lobe and right occipital lobe noted, stable from prior. No mass lesion, midline shift or mass effect. No hydrocephalus. No extra-axial fluid collection. Major dural sinuses are grossly patent. Incidental note made of an empty sella. Vascular: Chronic abnormal flow void at the right carotid siphon, poorly visualized on this motion degraded exam. Major intracranial vascular flow voids otherwise maintained. Skull and upper cervical spine: Craniocervical junction normal. Visualized upper cervical spine unremarkable. Bone marrow signal intensity normal. No scalp soft tissue abnormality. Sinuses/Orbits: Globes and orbital soft tissues grossly unremarkable. Patient status post lens extraction bilaterally. Scattered mucosal thickening noted within ethmoidal air cells and left maxillary sinus. No air-fluid level to suggest active sinus infection. No mastoid effusion. MRA HEAD FINDINGS ANTERIOR CIRCULATION: Distal cervical segments of the internal carotid artery is are patent with antegrade flow. Right ICA diminutive as compared to the left. Petrous, cavernous, and supraclinoid left ICA patent without flow-limiting stenosis. Petrous right ICA patent. There is loss of signal flow void at the cavernous/ supraclinoid  right ICA related to the right ICA stent. Distal reconstitution at the right ICA terminus which is patent. Left A1 segment dominant and widely patent. Right A1 segment hypoplastic but patent as well. Anterior communicating artery normal. Anterior cerebral arteries patent to their distal aspects. Left M1 segment, proximal M2 branches, and distal left MCA branches are widely patent. Patent flow through the right M1 segment without stenosis or occlusion. Flow is mildly attenuated as compared to the left. No proximal M2 occlusion on the right. Distal right MCA branches mildly attenuated as compared to the left. POSTERIOR CIRCULATION: Vertebral arteries widely patent to the vertebrobasilar junction. Posterior inferior cerebral arteries patent proximally. Basilar artery widely patent. Superior cerebral arteries and posterior cerebral arteries  widely patent bilaterally without stenosis. No aneurysm or vascular malformation. IMPRESSION: MRI HEAD IMPRESSION: 1. Patchy multifocal acute ischemic right MCA territory infarcts, predominantly cortical and posterior right frontal in location, with involvement of the right pre and postcentral gyri. Associated mild petechial hemorrhage without hemorrhagic transformation. Probable few scattered superimposed more subacute infarcts as above. 2. Moderate chronic microvascular ischemic disease. MRA HEAD IMPRESSION: 1. Absent flow void at the level of the right cavernous/supraclinoid stent with distal reconstitution at the right ICA terminus. Patent flow within the right MCA artery and its branches, although somewhat attenuated as compared to the contralateral left. Vascular patency through the stent could further evaluated with CTA as clinically desired. 2. Otherwise negative intracranial MRA. Electronically Signed   By: Jeannine Boga M.D.   On: 07/18/2016 02:01   Mr Jodene Nam Headm  Result Date: 07/18/2016 CLINICAL DATA:  Initial evaluation for left hemi paresis, slurred speech.  History of prior proximal right ICA and supraclinoid stenting. EXAM: MRI HEAD WITHOUT CONTRAST MRA HEAD WITHOUT CONTRAST TECHNIQUE: Multiplanar, multiecho pulse sequences of the brain and surrounding structures were obtained without intravenous contrast. Angiographic images of the head were obtained using MRA technique without contrast. COMPARISON:  Prior CT earlier the same day as well as previous MRI from 02/06/2015. FINDINGS: MRI HEAD FINDINGS Brain: Study moderately degraded by motion artifact. Generalized age related cerebral atrophy. Patchy and confluent T2/FLAIR hyperintensity within the periventricular and deep white matter both cerebral hemispheres most consistent with chronic small vessel ischemic disease. Overall, changes are moderate in nature. There is patchy predominantly cortical abnormal restricted diffusion involving the right frontoparietal region, with involvement of the pre and postcentral gyrus (series 9, image 46). Additional patchy cortical involvement more anteriorly within the right frontal lobe (series 9, image 43). Few small foci within the periventricular and subcortical white matter of the right frontal and parietal lobes (series 9, image 34). These are acute in nature with corresponding signal loss on ADC map. Probable trace petechial hemorrhage within a few areas of infarction at the posterior right frontal region (series 11, image 24, 23). Few more vague foci of diffusion abnormality without definite ADC correlate within the right frontal lobe (series 9, image 37), right periatrial white matter (series 9, image 25), and right parietal lobe (series 9, image 31) are suspected to be more subacute in nature. No other evidence for acute ischemia. Few scattered foci of chronic microhemorrhage at the parasagittal right parietal lobe and right occipital lobe noted, stable from prior. No mass lesion, midline shift or mass effect. No hydrocephalus. No extra-axial fluid collection. Major dural  sinuses are grossly patent. Incidental note made of an empty sella. Vascular: Chronic abnormal flow void at the right carotid siphon, poorly visualized on this motion degraded exam. Major intracranial vascular flow voids otherwise maintained. Skull and upper cervical spine: Craniocervical junction normal. Visualized upper cervical spine unremarkable. Bone marrow signal intensity normal. No scalp soft tissue abnormality. Sinuses/Orbits: Globes and orbital soft tissues grossly unremarkable. Patient status post lens extraction bilaterally. Scattered mucosal thickening noted within ethmoidal air cells and left maxillary sinus. No air-fluid level to suggest active sinus infection. No mastoid effusion. MRA HEAD FINDINGS ANTERIOR CIRCULATION: Distal cervical segments of the internal carotid artery is are patent with antegrade flow. Right ICA diminutive as compared to the left. Petrous, cavernous, and supraclinoid left ICA patent without flow-limiting stenosis. Petrous right ICA patent. There is loss of signal flow void at the cavernous/ supraclinoid right ICA related to the right ICA stent. Distal  reconstitution at the right ICA terminus which is patent. Left A1 segment dominant and widely patent. Right A1 segment hypoplastic but patent as well. Anterior communicating artery normal. Anterior cerebral arteries patent to their distal aspects. Left M1 segment, proximal M2 branches, and distal left MCA branches are widely patent. Patent flow through the right M1 segment without stenosis or occlusion. Flow is mildly attenuated as compared to the left. No proximal M2 occlusion on the right. Distal right MCA branches mildly attenuated as compared to the left. POSTERIOR CIRCULATION: Vertebral arteries widely patent to the vertebrobasilar junction. Posterior inferior cerebral arteries patent proximally. Basilar artery widely patent. Superior cerebral arteries and posterior cerebral arteries widely patent bilaterally without  stenosis. No aneurysm or vascular malformation. IMPRESSION: MRI HEAD IMPRESSION: 1. Patchy multifocal acute ischemic right MCA territory infarcts, predominantly cortical and posterior right frontal in location, with involvement of the right pre and postcentral gyri. Associated mild petechial hemorrhage without hemorrhagic transformation. Probable few scattered superimposed more subacute infarcts as above. 2. Moderate chronic microvascular ischemic disease. MRA HEAD IMPRESSION: 1. Absent flow void at the level of the right cavernous/supraclinoid stent with distal reconstitution at the right ICA terminus. Patent flow within the right MCA artery and its branches, although somewhat attenuated as compared to the contralateral left. Vascular patency through the stent could further evaluated with CTA as clinically desired. 2. Otherwise negative intracranial MRA. Electronically Signed   By: Jeannine Boga M.D.   On: 07/18/2016 02:01   Ct Head Code Stroke W/o Cm  Result Date: 07/17/2016 CLINICAL DATA:  Code stroke. Right-sided facial droop beginning 1 hour ago. Left-sided weakness beginning 6 days ago. EXAM: CT HEAD WITHOUT CONTRAST TECHNIQUE: Contiguous axial images were obtained from the base of the skull through the vertex without intravenous contrast. COMPARISON:  CT head without contrast 05/29/2016. FINDINGS: Brain: An acute/subacute posterior right frontal lobe nonhemorrhagic infarct is more prominent than on the prior exam. No acute or subacute left-sided infarct is present. The basal ganglia are intact bilaterally. The insular ribbon is normal. No acute hemorrhage or mass lesion is present. The ventricles are of normal size. No significant extra-axial fluid collection is present. Vascular: A cavernous right internal carotid artery stent is in place. There is no hyperdense vessel. Atherosclerotic changes are noted bilaterally. Skull: The calvarium is intact. No focal lytic or blastic lesions are present.  Sinuses/Orbits: The left maxillary sinus roof and orbital floor is status post ORIF. Left maxillary sinus is chronically shrunken. The remaining paranasal sinuses and the mastoid air cells are clear bilaterally. Rightward nasal septal deviation is present. Bilateral lens replacements are present. The globes and orbits are within normal limits otherwise. ASPECTS [facility] Stroke Program Early CT Score) - Ganglionic level infarction (caudate, lentiform nuclei, internal capsule, insula, M1-M3 cortex): 7/7 - Supraganglionic infarction (M4-M6 cortex): 2/3 Total score (0-10 with 10 being normal): 9/10 IMPRESSION: 1. Posterior right frontal lobe acute/subacute infarct is more prominent than on the study 2 months ago. This may reflect evolution of the previous infarct or a a progressive new infarct. 2. No other acute infarct. 3. ASPECTS is 9/10 Electronically Signed   By: San Morelle M.D.   On: 07/17/2016 17:37    Labs:  CBC:  Recent Labs  05/29/16 1557 07/17/16 1655 07/17/16 1710 07/19/16 0917 07/20/16 0650  WBC 7.8 6.1  --  5.1 5.9  HGB 14.9 14.9 15.0 15.2 14.0  HCT 45.6 46.6 44.0 46.5 43.2  PLT 189 147*  --  134* 129*  COAGS:  Recent Labs  10/15/15 1422 07/17/16 1655  INR 1.06 1.01  APTT 27 27    BMP:  Recent Labs  05/29/16 1557 07/17/16 1655 07/17/16 1710 07/19/16 0917 07/20/16 0650  NA 142 145 145 144 143  K 4.9 3.9 3.8 3.5 3.8  CL 107 110 107 110 111  CO2 26 27  --  24 27  GLUCOSE 112* 87 86 156* 116*  BUN 12 12 13 12 14   CALCIUM 9.6 9.7  --  9.7 9.5  CREATININE 1.64* 0.96 1.00 0.92 0.87  GFRNONAA 42* >60  --  >60 >60  GFRAA 48* >60  --  >60 >60    LIVER FUNCTION TESTS:  Recent Labs  10/15/15 1422 04/28/16 1515 05/04/16 1350 07/17/16 1655  BILITOT 0.7 CANCELED 0.3 0.6  AST 20 CANCELED 11 16  ALT 26 CANCELED 15 12*  ALKPHOS 83 CANCELED 71 58  PROT 6.2* CANCELED 5.5* 6.4*  ALBUMIN 3.7 CANCELED 3.6 3.6    TUMOR MARKERS: No results for input(s):  AFPTM, CEA, CA199, CHROMGRNA in the last 8760 hours.  Assessment and Plan:  Hx CVA Hx right carotid stenting 01/2015 in IR New CVA New Left sided weakness Now scheduled for cerebral arteriogram Risks and Benefits discussed with the patient including, but not limited to bleeding, infection, vascular injury, contrast induced renal failure, stroke or even death. All of the patient's questions were answered, patient is agreeable to proceed. Consent signed and in chart.  Thank you for this interesting consult.  I greatly enjoyed meeting [name] and look forward to participating in their care.  A copy of this report was sent to the requesting provider on this date.  Electronically Signed: Lavonia Drafts, PA-C 07/20/2016, 9:01 AM   I spent a total of 40 Minutes    in face to face in clinical consultation, greater than 50% of which was counseling/coordinating care for cerebral arteriogram

## 2016-07-20 NOTE — Progress Notes (Signed)
OT Cancellation Note  Patient Details Name: [name] MRN: [id number] DOB: 05/07/1950   Cancelled Treatment:    Reason Eval/Treat Not Completed: Patient at procedure or test/ unavailable. Pt s/p arteriogram through L CFA. Will follow up tomorrow.   [place], OT/L  [phone] 07/20/2016 07/20/2016, 12:11 PM

## 2016-07-20 NOTE — Progress Notes (Signed)
Family Medicine Teaching Service Daily Progress Note Intern Pager: [phone]  Patient name: [name] Medical record number: [id number] Date of birth: 08/03/1950 Age: 66 y.o. Gender: male  Primary Care Provider: Arnoldo Morale, MD Consultants: neurology Code Status: DNR   Assessment and Plan: [name] is a 66 y.o. male with a past medical history significant for PAF, HTN, CAD, dCHF, substance use, DVT, gout, T2DM, carotid stenosis s/p stents, COPD, depression and dementia who presented with left hemiparesis and slurred speech and found to have an acute ischemic right MCA territory infarcts.    #CVA in Rt MCA territory Multiple episodes  in the past. Patient with cervical and intracranial R ICA s/p stent. CT head and MRI brain with acute/subacute infarct in right MCA territory. Exam consistent with imaging. Carotid doppler showed 1-39% bilateral occlusion with patent right stent. Lipid panel showed chole 155, HDL 36, LDL 95, Trigly 121 and normal TSH. Concern for PAF but no anticoagulation. Neurology concerned about a high grade stenosis distal to the cervical right carotid artery stent seen on CT angiogram and recommended cerebral catheter angiogram for further evaluation. --Follow up on Stroke Team consult , appreciate recs. --Follow cerebral catheter angiogram  --Continue aspirin 325 mg daily, though expect 81 mg  --Will increase to Atorvastatin 80 mg daily --Continue cardiac monitor  #PAF Concerning given stroke. [place] Vasc score 7. Not on any anticoagulation except aspirin. Had second degree AV block overnight. Cardiology do not recommend anticoagulation.  #2nd degree AVB-II, resolved No episode in the past 24 hours. Seen by cardiology who recommend continued monitor and no AV node blocking agent.  #HTN BP this am 174/70. Given recent stroke will continue permissive hypertension. --Continue to hold Coreg 25 mg bid  #CAD Had LHC on 05/27/2010 that showed severe CAD  and left circumflex artery. Patient without acute chest pain or dyspnea at this time. Initial EKG and troponin are negative. He is on Coreg, aspirin, Plavix and atorvastatin at home --Stop Coreg ion the setting of AV block and recent cocaine use --Continue aspirin 325 mg daily, though unusal for ppx --Start patient on Atorvastatin 80 mg daily  #dCHF Repeat ECHO on 6/9 showed an EF of 50-55% w/mild LVH and normal systolic function with H2CN. No recurrence of AV block noted 2 nights ago. Seen by cardiology this am, who recommend aggressive statin therapy, and risk factor control (i.e smoking cessation). --Continue to hold BP meds given need for permissive hypertension  #Urinary incontinence, resolved, unclear etiology  Not sure if this is related to his stroke. He has no bowel issues. UA negative. Adequate urine output since admission.  --Will continue to monitor  #Substance use UDS positive for cocaine. Given extensive cardiac history and possible arrhythmias noted during this admission, patient counseled on need to quit. --Will avoid beta blocker in the acute setting given recent use  #Tobacco use disorder Cardiology and primary team have reinforced need to quit given cardiac history, COPD and other comorbidities. --Continue Nicoderm 21 mg   #T2DM CBG in low 100s. A1c 5.8 . Seems to be taken off medications. Requiring minimal rapid acting insulin since admission.  --Continue SSI-thin --CBG qac/hs  #Peripheral Neuropathy  --Resume Gabapentin 300 mg tid  #COPD, stable On albuterol at home. He continues to smoke about 5-6 cigarettes a day. He has no respiratory symptoms.  --Continue albuterol neb prn  #OSA Patient does not use CPAP at home.   #Dementia, depression, stable Appears to be well controlled on home regimen. [place]  score 21. Per speech patient had significant difficulty with visuospatial tasks (2/5) and language tasks of repetition and generative naming.   --Continue  Prozac 20 mg daily --Continue Memantine 10 mg bid  #Gout, stable --Continue allopurinol 300 mg daily   #Left shoulder pain Tender to palpation over his left shoulder anteriorly. Full passive range of motion without pain.  --Will continue to monitor --K pad and pain control as needed  FEN/GI: Currently  NPO for Procedure. Dysphagia 3 (Mech soft; Thin Liquid), IVF 100 cc/hr Prophylaxis: SCDs  Disposition: SNF pending completion of stroke work up and Neurology final recommendations  Subjective:  Patient is doing better this morning and is ready for his procedure this morning as he would like to eat. No other complaints this morning. Left sided weakness is still present.  Objective: Temp:  [97.7 F (36.5 C)-98.6 F (37 C)] 98.2 F (36.8 C) (06/11 0556) Pulse Rate:  [51-89] 51 (06/11 0556) Resp:  [16-20] 18 (06/11 0556) BP: (141-194)/(65-82) 174/70 (06/11 0556) SpO2:  [95 %-100 %] 100 % (06/11 0556) Weight:  [228 lb 1.6 oz (103.5 kg)] 228 lb 1.6 oz (103.5 kg) (06/11 0556)   Physical Exam: GEN: appears well, no apparent distress. CVS: RRR, nl s1 & s2, no murmurs, 1+ edema bilaterally RESP: speaks in full sentence, no IWOB, good air movement bilaterally, CTAB GI: BS present & normal, soft, NTND GU: no suprapubic or CVA tenderness MSK: no notable swelling, mild tenderness to palpation over his left shoulder anteriorly. But no  SKIN: no apparent skin lesion NEURO: Awake, alert and oriented 4, cranial nerve was intact except for CN XI deficit on the left, motor 1/5 in LUE, 4+/5 in LLE, mildly diminished light sensation in the left upper extremities, patellar reflex 2+ on the left & 1+ on the right (history of right knee replacement), finger-to-nose intact on the right, couldn't perform on the left due to weakness.  PSYCH: euthymic mood with congruent affect  Laboratory:  Recent Labs Lab 07/17/16 1655 07/17/16 1710 07/19/16 0917  WBC 6.1  --  5.1  HGB 14.9 15.0 15.2  HCT 46.6  44.0 46.5  PLT 147*  --  134*    Recent Labs Lab 07/17/16 1655 07/17/16 1710 07/19/16 0917  NA 145 145 144  K 3.9 3.8 3.5  CL 110 107 110  CO2 27  --  24  BUN 12 13 12   CREATININE 0.96 1.00 0.92  CALCIUM 9.7  --  9.7  PROT 6.4*  --   --   BILITOT 0.6  --   --   ALKPHOS 58  --   --   ALT 12*  --   --   AST 16  --   --   GLUCOSE 87 86 156*    Imaging/Diagnostic Tests:  Ct Angio Head W Or Wo Contrast   IMPRESSION: 1. Negative for emergent large vessel occlusion. 2. There are patent vascular stents in both the cervical right ICA and the right ICA siphon. However, the right cervical carotid stent is taper distally and this is suspected to be hemodynamically significant. No definite intracranial in-stent stenosis identified. 3. Underlying widespread atherosclerosis of the aortic arch and head and neck vessels. 4. Ulcerated soft plaque and calcified plaque in the distal arch. Plaque in the proximal brachiocephalic artery which is approaching hemodynamically significance. 5. No significant left cervical carotid or left ICA stenosis despite plaque. Incidental left EXTERNAL carotid artery occlusion at its origin. 6. Multifocal moderate right vertebral artery stenosis at  its origin and V1 segment. Mild left vertebral artery origin stenosis. 7. Stable CT appearance of the brain since yesterday. Electronically Signed   By: Genevie Ann M.D.   On: 07/18/2016 13:35   Ct Angio Neck W Or Wo Contrast   IMPRESSION: 1. Negative for emergent large vessel occlusion. 2. There are patent vascular stents in both the cervical right ICA and the right ICA siphon. However, the right cervical carotid stent is taper distally and this is suspected to be hemodynamically significant. No definite intracranial in-stent stenosis identified. 3. Underlying widespread atherosclerosis of the aortic arch and head and neck vessels. 4. Ulcerated soft plaque and calcified plaque in the distal arch. Plaque in the proximal  brachiocephalic artery which is approaching hemodynamically significance. 5. No significant left cervical carotid or left ICA stenosis despite plaque. Incidental left EXTERNAL carotid artery occlusion at its origin. 6. Multifocal moderate right vertebral artery stenosis at its origin and V1 segment. Mild left vertebral artery origin stenosis. 7. Stable CT appearance of the brain since yesterday. Electronically Signed   By: Genevie Ann M.D.   On: 07/18/2016 13:35    Marjie Skiff, MD 07/20/2016, 6:34 AM PGY-1, [place] Intern pager: [phone], text pages welcome

## 2016-07-20 NOTE — Progress Notes (Signed)
CSW provided patient with SNF bed offers. Patient requested time overnight to talk with his aide and look into options. CSW will follow up tomorrow morning on SNF selection to facilitate discharge.  Laveda Abbe LCSW [phone]

## 2016-07-20 NOTE — Procedures (Signed)
S/P 4 vessel cerebral arteriogram. Lt CFA approach. Findings. 1,Approx 50- % intrastent stenosis RT ICA cavernouis seg. 2. approx 20 % stenosis distal portion of  RT ICA, prox stent. 3 .50 % stenosis at origin of innominate artery .4. Approx 40  To 50 % stenosis Lt ICA supraclinoid seg.

## 2016-07-20 NOTE — Progress Notes (Signed)
Patient flat for four hours. Patient ambulated with PT. Patient is now up in chair. Dressing over groin is clean, dry, intact. Level 0.

## 2016-07-20 NOTE — Progress Notes (Signed)
Patient returned from IR. Patient is alert, oriented x4, denies any pain, dressing over left femoral is at level 0, clean, dry, intact dressing. Pt aware he is on bed rest until 1530. Lunch  Tray ordered.

## 2016-07-20 NOTE — Progress Notes (Signed)
Physical Therapy Treatment Patient Details Name: [name] MRN: [id number] DOB: [birth year] Today's Date: 07/20/2016    History of Present Illness [name] is a 66 y.o. male presenting with left hemiparesis and slurred speech . PMH is significant for ?PAF (no anticoag), HTN, CAD, dCHF, substance use, history of DVT, gout, DM, carotid stenosis s/p stents, COPD, depression and dementia.  MRI showed Patchy multifocal acute ischemic right MCA territory infarcts, predominantly cortical and posterior right frontal in location, with involvement of the right pre and postcentral gyri.    PT Comments    Pt making good though slower improvement.  Emphasis today on gait quality and experiencing the feel of the hemiwalker.   Follow Up Recommendations  SNF     Equipment Recommendations  Other (comment) (TBA next venue)    Recommendations for Other Services       Precautions / Restrictions Precautions Precautions: Fall;Other (comment) Required Braces or Orthoses: Sling Restrictions Weight Bearing Restrictions: No    Mobility  Bed Mobility Overal bed mobility: Needs Assistance Bed Mobility: Supine to Sit     Supine to sit: Min assist;HOB elevated     General bed mobility comments: pt cued to pull his arm into lap for support during transition to sit, but UE kept falling without assist.  Also minimal truncal assist to come forward up via R elbow.  Transfers Overall transfer level: Needs assistance Equipment used: Hemi-walker Transfers: Sit to/from [facility] Sit to Stand: Min assist Stand pivot transfers: Min assist       General transfer comment: steadying assist overall.  Cues and assist to maneuver AD and help with w/shift R LE movement  Ambulation/Gait Ambulation/Gait assistance: Mod assist;Min assist Ambulation Distance (Feet): 120 Feet (with 1 long standing rest to recover from fatigue) Assistive device: Hemi-walker Gait Pattern/deviations:  Step-to pattern;Step-through pattern   Gait velocity interpretation: Below normal speed for age/gender General Gait Details: Worked on w/shift, L toe off and step through with cues to maneuvering the [facility] appropriately.  Pt fatigued by the halfway pt with w/shifting and advancing L LE, becoming more difficult and a shuffle at best.   Science writer    Modified Rankin (Stroke Patients Only) Modified Rankin (Stroke Patients Only) Pre-Morbid Rankin Score: Slight disability Modified Rankin: Moderately severe disability     Balance Overall balance assessment: Needs assistance   Sitting balance-Leahy Scale: Fair     Standing balance support: Single extremity supported Standing balance-Leahy Scale: Poor Standing balance comment: needing AD or external support for safety                            Cognition Arousal/Alertness: Awake/alert Behavior During Therapy: WFL for tasks assessed/performed Overall Cognitive Status: History of cognitive impairments - at baseline                                        Exercises      General Comments        Pertinent Vitals/Pain Pain Assessment: Faces Faces Pain Scale: Hurts a little bit Pain Location: left shoulder  Pain Descriptors / Indicators: Tightness Pain Intervention(s): Monitored during session;Other (comment) (supported arm during gait)    Home Living  Prior Function            PT Goals (current goals can now be found in the care plan section) Acute Rehab PT Goals Patient Stated Goal: back home eventually; use left arm functionally. PT Goal Formulation: With patient Time For Goal Achievement: 08/01/16 Potential to Achieve Goals: Good Progress towards PT goals: Progressing toward goals    Frequency    Min 3X/week      PT Plan Current plan remains appropriate    Co-evaluation              AM-PAC PT "6 Clicks" Daily  Activity  Outcome Measure  Difficulty turning over in bed (including adjusting bedclothes, sheets and blankets)?: Total Difficulty moving from lying on back to sitting on the side of the bed? : Total Difficulty sitting down on and standing up from a chair with arms (e.g., wheelchair, bedside commode, etc,.)?: Total Help needed moving to and from a bed to chair (including a wheelchair)?: A Little Help needed walking in hospital room?: A Lot Help needed climbing 3-5 steps with a railing? : A Lot 6 Click Score: 10    End of Session   Activity Tolerance: Patient tolerated treatment well Patient left: in chair;with call bell/phone within reach;with chair alarm set;with nursing/sitter in room Nurse Communication: Mobility status PT Visit Diagnosis: Unsteadiness on feet (R26.81);Other abnormalities of gait and mobility (R26.89);Hemiplegia and hemiparesis Hemiplegia - Right/Left: Left Hemiplegia - dominant/non-dominant: Non-dominant Hemiplegia - caused by: Cerebral infarction     Time: 7353-2992 PT Time Calculation (min) (ACUTE ONLY): 22 min  Charges:  $Gait Training: 8-22 mins                    G Codes:       14-Aug-2016  [name], PT [phone]  (pager)   [name] 08-14-16, 5:45 PM

## 2016-07-21 ENCOUNTER — Encounter (HOSPITAL_COMMUNITY): Payer: Self-pay | Admitting: Interventional Radiology

## 2016-07-21 LAB — GLUCOSE, CAPILLARY
GLUCOSE-CAPILLARY: 127 mg/dL — AB (ref 65–99)
GLUCOSE-CAPILLARY: 68 mg/dL (ref 65–99)

## 2016-07-21 MED ORDER — ATORVASTATIN CALCIUM 80 MG PO TABS: 80.0000 mg | ORAL_TABLET | Freq: Every day | ORAL | 1 refills | Status: DC

## 2016-07-21 MED ORDER — SENNOSIDES-DOCUSATE SODIUM 8.6-50 MG PO TABS
1.0000 | ORAL_TABLET | Freq: Once | ORAL | Status: AC
  Administered 2016-07-21: 1 via ORAL
  Filled 2016-07-21: qty 1

## 2016-07-21 MED ORDER — POLYETHYLENE GLYCOL 3350 17 G PO PACK: 17.0000 g | PACK | Freq: Every day | ORAL | 0 refills | Status: DC

## 2016-07-21 MED ORDER — POLYETHYLENE GLYCOL 3350 17 G PO PACK
17.0000 g | PACK | Freq: Every day | ORAL | Status: DC
  Administered 2016-07-21: 17 g via ORAL
  Filled 2016-07-21: qty 1

## 2016-07-21 MED ORDER — ASPIRIN 81 MG PO TBEC: 81.0000 mg | DELAYED_RELEASE_TABLET | Freq: Every day | ORAL | 1 refills | Status: DC

## 2016-07-21 NOTE — Clinical Social Work Placement (Signed)
   CLINICAL SOCIAL WORK PLACEMENT  NOTE  Date:  07/21/2016  Patient Details  Name: [name] MRN: [id number] Date of Birth: 02/17/1950  Clinical Social Work is seeking post-discharge placement for this patient at the [place] level of care (*CSW will initial, date and re-position this form in  chart as items are completed):  Yes   Patient/family provided with [place] Work Department's list of facilities offering this level of care within the geographic area requested by the patient (or if unable, by the patient's family).  Yes   Patient/family informed of their freedom to choose among providers that offer the needed level of care, that participate in Medicare, Medicaid or managed care program needed by the patient, have an available bed and are willing to accept the patient.  Yes   Patient/family informed of [place]'s ownership interest in [facility] and [facility], as well as of the fact that they are under no obligation to receive care at these facilities.  PASRR submitted to EDS on       PASRR number received on       Existing PASRR number confirmed on 07/19/16     FL2 transmitted to all facilities in geographic area requested by pt/family on       FL2 transmitted to all facilities within larger geographic area on       Patient informed that his/her managed care company has contracts with or will negotiate with certain facilities, including the following:            Patient/family informed of bed offers received.  Patient chooses bed at [facility]     Physician recommends and patient chooses bed at      Patient to be transferred to [facility] on 07/21/16.  Patient to be transferred to facility by [facility]     Patient family notified on   of transfer.  Name of family member notified:        PHYSICIAN       Additional Comment:    _______________________________________________ Geralynn Ochs, LCSW 07/21/2016, 3:27 PM

## 2016-07-21 NOTE — Progress Notes (Signed)
Discharge to: [facility] Anticipated discharge date: 07/21/16 Transportation by: [facility]  Report #: [phone], Room 128B.  CSW signing off.  Laveda Abbe LCSW [phone]

## 2016-07-21 NOTE — Progress Notes (Signed)
Transitions of Care Pharmacy Note  Plan:  Educated on new statin dose and importance of adherence to antiplatelet regimen Counseled on nicotine replacement patch --------------------------------------------- [name] is an 66 y.o. male who presents with a chief complaint L sided weakness. In anticipation of discharge, pharmacy has reviewed this patient's prior to admission medication history, as well as current inpatient medications listed per the [facility].  Current medication indications, dosing, frequency, and notable side effects reviewed with patient. patient verbalized understanding of current inpatient medication regimen and is aware that the After Visit Summary when presented, will represent the most accurate medication list at discharge.    Assessment: Understanding of regimen: fair Understanding of indications: fair Potential of compliance: fair Barriers to Obtaining Medications: No  Patient instructed to contact inpatient pharmacy team with further questions or concerns if needed.    Time spent preparing for discharge counseling: 10 Time spent counseling patient: 10   Thank you for allowing pharmacy to be a part of this patient's care.  Arrie Senate, PharmD PGY-1 Pharmacy Resident Pager: [phone] 07/21/2016

## 2016-07-21 NOTE — Progress Notes (Signed)
Attempt report to [facility]. [phone]

## 2016-07-21 NOTE — Care Management Important Message (Signed)
Important Message  Patient Details  Name: [name] MRN: [id number] Date of Birth: 06-08-1950   Medicare Important Message Given:  Yes    [name] 07/21/2016, 11:37 AM

## 2016-07-21 NOTE — Progress Notes (Signed)
Occupational Therapy Treatment Patient Details Name: [name] MRN: [id number] DOB: Jan 07, 1951 Today's Date: 07/21/2016    History of present illness [name] is Kenneth 66 y.o. male presenting with left hemiparesis and slurred speech . PMH is significant for ?PAF (no anticoag), HTN, CAD, dCHF, substance use, history of DVT, gout, DM, carotid stenosis s/p stents, COPD, depression and dementia.  MRI showed Patchy multifocal acute ischemic right MCA territory infarcts, predominantly cortical and posterior right frontal in location, with involvement of the right pre and postcentral gyri.   OT comments  Pt demonstrating progress toward OT goals. He was able to complete self-feeding tasks with min assist this session. Pt educated concerning hemi-dressing techniques and was able to don gown with max assist. Pt demonstrates improved ability to complete stand-pivot toilet transfer with min assist this session utilizing sling for L UE support during mobility. He additionally was able to complete standing grooming tasks with moderate assist demonstrating improved independence this date. Facilitated weight bearing through L UE during functional reaching tasks crossing midline with R hand. D/C plan remains appropriate. OT will continue to follow while admitted.   Follow Up Recommendations  SNF    Equipment Recommendations  Other (comment) (defer to next venue of care)    Recommendations for Other Services      Precautions / Restrictions Precautions Precautions: Fall;Other (comment) Precaution Comments: L sided weakness with L UE flaccid Required Braces or Orthoses: Sling (L UE during mobility for support) Restrictions Weight Bearing Restrictions: No       Mobility Bed Mobility Overal bed mobility: Needs Assistance Bed Mobility: Supine to Sit     Supine to sit: Min assist;HOB elevated     General bed mobility comments: Assist for trunk and to maintain L arm in lap.    Transfers Overall transfer level: Needs assistance Equipment used: 1 person hand held assist Transfers: Sit to/from [facility] Sit to Stand: Min assist Stand pivot transfers: Min assist       General transfer comment: Min assist to lift and steady during rise to standing.     Balance Overall balance assessment: Needs assistance Sitting-balance support: No upper extremity supported;Feet supported Sitting balance-Leahy Scale: Fair     Standing balance support: Single extremity supported Standing balance-Leahy Scale: Poor Standing balance comment: External assist or single UE support for safety.                            ADL either performed or assessed with clinical judgement   ADL Overall ADL's : Needs assistance/impaired Eating/Feeding: Minimal assistance;Sitting Eating/Feeding Details (indicate cue type and reason): Min assist for bilateral tasks Grooming: Standing;Moderate assistance Grooming Details (indicate cue type and reason): Min assist for sit<>stand and mod assist for bilateral tasks.          Upper Body Dressing : Maximal assistance;Sitting Upper Body Dressing Details (indicate cue type and reason): Educated pt concerning hemi-dressing techniques.  Lower Body Dressing: Maximal assistance;Sit to/from stand   Toilet Transfer: Stand-pivot;Minimal Print production planner Details (indicate cue type and reason): Using sling for L UE support.            General ADL Comments: Pt able to stand at sink for grooming tasks and complete multiple toilet transfers this session with assist as detailed above. Educated pt on hemidressing techniques and method to don/doff sling.      Vision       Perception  Praxis      Cognition Arousal/Alertness: Awake/alert Behavior During Therapy: WFL for tasks assessed/performed Overall Cognitive Status: History of cognitive impairments - at baseline                                  General Comments: Pt with decreased ability to problem solve and follow commands. Per chart, pt with history of cognitive deficits.         Exercises Other Exercises Other Exercises: Facilitated weight bearing through L UE during functional reach tasks to improve stability.   Shoulder Instructions       General Comments      Pertinent Vitals/ Pain       Pain Assessment: Faces Faces Pain Scale: Hurts little more Pain Location: left shoulder  Pain Descriptors / Indicators: Tightness Pain Intervention(s): Monitored during session;Repositioned  Home Living                                          Prior Functioning/Environment              Frequency  Min 2X/week        Progress Toward Goals  OT Goals(current goals can now be found in the care plan section)  Progress towards OT goals: Progressing toward goals  Acute Rehab OT Goals Patient Stated Goal: back home eventually; use left arm functionally. OT Goal Formulation: With patient Time For Goal Achievement: 08/01/16 Potential to Achieve Goals: Good ADL Goals Pt Will Perform Eating: sitting;with modified independence Pt Will Perform Grooming: sitting;with mod assist Pt Will Perform Upper Body Bathing: with mod assist;sitting Pt Will Perform Lower Body Bathing: sit to/from stand;with min assist Pt Will Perform Upper Body Dressing: with mod assist;sitting Pt Will Perform Lower Body Dressing: with min assist;sit to/from stand Pt Will Transfer to Toilet: with mod assist;ambulating;bedside commode Pt Will Perform Toileting - Clothing Manipulation and hygiene: with min assist;sit to/from stand Pt/caregiver will Perform Home Exercise Program: Increased ROM;Increased strength;Left upper extremity;With minimal assist Additional ADL Goal #1: Pt will be independent with positioning of LUE.   Plan Discharge plan remains appropriate    Co-evaluation                 AM-PAC PT "6 Clicks"  Daily Activity     Outcome Measure   Help from another person eating meals?: Kenneth Little Help from another person taking care of personal grooming?: Kenneth Lot Help from another person toileting, which includes using toliet, bedpan, or urinal?: Kenneth Lot Help from another person bathing (including washing, rinsing, drying)?: Kenneth Lot Help from another person to put on and taking off regular upper body clothing?: Kenneth Lot Help from another person to put on and taking off regular lower body clothing?: Kenneth Lot 6 Click Score: 13    End of Session Equipment Utilized During Treatment: Gait belt (L shoulder sling)  OT Visit Diagnosis: Unsteadiness on feet (R26.81);History of falling (Z91.81);Hemiplegia and hemiparesis;Pain;Other symptoms and signs involving cognitive function Hemiplegia - Right/Left: Left Hemiplegia - dominant/non-dominant: Non-Dominant Hemiplegia - caused by: Cerebral infarction Pain - Right/Left: Left Pain - part of body: Shoulder   Activity Tolerance Patient tolerated treatment well   Patient Left in chair;with call bell/phone within reach;with chair alarm set   Nurse Communication Mobility status        Time: 4540-9811 OT  Time Calculation (min): 40 min  Charges: OT General Charges $OT Visit: 1 Procedure OT Treatments $Self Care/Home Management : 38-52 mins  Norman Herrlich, MS OTR/L  Pager: [place] [name] 07/21/2016, 10:20 AM

## 2016-07-21 NOTE — Discharge Instructions (Signed)
Ischemic Stroke An ischemic stroke is the sudden death of brain tissue. Blood carries oxygen to all areas of the body. This type of stroke happens when your blood does not flow to your brain like normal. Your brain cannot get the oxygen it needs. This is an emergency. It must be treated right away. Symptoms of a stroke usually happen all of a sudden. You may notice them when you wake up. They can include:  Weakness or loss of feeling in your face, arm, or leg. This often happens on one side of the body.  Trouble walking.  Trouble moving your arms or legs.  Loss of balance or coordination.  Feeling confused.  Trouble talking or understanding what people are saying.  Slurred speech.  Trouble seeing.  Seeing two of one object (double vision).  Feeling dizzy.  Feeling sick to your stomach (nauseous) and throwing up (vomiting).  A very bad headache for no reason.  Get help as soon as any of these problems start. This is important. Some treatments work better if they are given right away. These include:  Aspirin.  Medicines to control blood pressure.  A shot (injection) of medicine to break up the blood clot.  Treatments given in the blood vessel (artery) to take out the clot or break it up.  Other treatments may include:  Oxygen.  Fluids given through an IV tube.  Medicines to thin out your blood.  Procedures to help your blood flow better.  What increases the risk? Certain things may make you more likely to have a stroke. Some of these are things that you can change, such as:  Being very overweight (obesity).  Smoking.  Taking birth control pills.  Not being active.  Drinking too much alcohol.  Using drugs.  Other risk factors include:  High blood pressure.  High cholesterol.  Diabetes.  Heart disease.  Being [place] American, Native American, Hispanic, or [place] Native.  Being over age 53.  Family history of stroke.  Having had blood clots,  stroke, or warning stroke (transient ischemic attack, TIA) in the past.  Sickle cell disease.  Being a woman with a history of high blood pressure in pregnancy (preeclampsia).  Migraine headache.  Sleep apnea.  Having an irregular heartbeat (atrial fibrillation).  Long-term (chronic) diseases that cause soreness and swelling (inflammation).  Disorders that affect how your blood clots.  Follow these instructions at home: Medicines  Take over-the-counter and prescription medicines only as told by your doctor.  If you were told to take aspirin or another medicine to thin your blood, take it exactly as told by your doctor. ? Taking too much of the medicine can cause bleeding. ? If you do not take enough, it may not work as well.  Know the side effects of your medicines. If you are taking a blood thinner, make sure you: ? Hold pressure over any cuts for longer than usual. ? Tell your dentist and other doctors that you take this medicine. ? Avoid activities that may cause damage or injury to your body. Eating and drinking  Follow instructions from your doctor about what you cannot eat or drink.  Eat healthy foods.  If you have trouble with swallowing, do these things to avoid choking: ? Take small bites when eating. ? Eat foods that are soft or pureed. Safety  Follow instructions from your health care team about physical activity.  Use a walker or cane as told by your doctor.  Keep your home safe so  you do not fall. This may include: ? Having experts look at your home to make sure it is safe. ? Putting grab bars in the bedroom and bathroom. ? Using raised toilets. ? Putting a seat in the shower. General instructions  Do not use any tobacco products. ? Examples of these are cigarettes, chewing tobacco, and e-cigarettes. ? If you need help quitting, ask your doctor.  Limit how much alcohol you drink. This means no more than 1 drink a day for nonpregnant women and 2  drinks a day for men. One drink equals 12 oz of beer, 5 oz of wine, or 1 oz of hard liquor.  If you need help to stop using drugs or alcohol, ask your doctor to refer you to a program or specialist.  Stay active. Exercise as told by your doctor.  Keep all follow-up visits as told by your doctor. This is important. Get help right away if:  You suddenly: ? Have weakness or loss of feeling in your face, arm, or leg. ? Feel confused. ? Have trouble talking or understanding what people are saying. ? Have trouble seeing. ? Have trouble walking. ? Have trouble moving your arms or legs. ? Feel dizzy. ? Lose your balance or coordination. ? Have a very bad headache and you do not know why.  You pass out (lose consciousness) or almost pass out.  You have jerky movements that you cannot control (seizure). These symptoms may be an emergency. Do not wait to see if the symptoms will go away. Get medical help right away. Call your local emergency services (911 in the [place] Do not drive yourself to the hospital. This information is not intended to replace advice given to you by your health care provider. Make sure you discuss any questions you have with your health care provider. Document Released: 01/15/2011 Document Revised: 07/09/2015 Document Reviewed: 04/24/2015 Elsevier Interactive Patient Education  [employer].

## 2016-07-21 NOTE — Care Management Note (Signed)
Case Management Note  Patient Details  Name: [name] MRN: [id number] Date of Birth: October 09, 1950  Subjective/Objective:                    Action/Plan: Pt discharging to SNF today. No further needs per CM.  Expected Discharge Date:  07/21/16               Expected Discharge Plan:  Skilled Nursing Facility  In-House Referral:  Clinical Social Work  Discharge planning Services     Post Acute Care Choice:    Choice offered to:     DME Arranged:    DME Agency:     HH Arranged:    [place] Agency:     Status of Service:  Completed, signed off  If discussed at [employer], dates discussed:    Additional Comments:  Pollie Friar, RN 07/21/2016, 2:50 PM

## 2016-07-21 NOTE — Progress Notes (Signed)
STROKE TEAM PROGRESS NOTE   HISTORY OF PRESENT ILLNESS (per record) This is a 66 year old right-handed man who was brought to the emergency department by his primary care aide. History is obtained from the patient. His gait is present at the bedside and offers additional information as needed.  The patient reports that he developed weakness in his left arm on 07/11/16. This weakness has been present ever since. He describes difficulty moving his left arm and says that he was unable to hold onto anything with his left hand. He states that he did not seek medical attention at that time because he "had other things to do." Today, he noticed worsening of the weakness in his left arm, and now is unable to move it at all. In addition, he has noticed some increasing weakness in his left leg today as well as some slurred speech. His healthcare aide reports that she noticed he was having difficulty walking and not why she had him brought to the emergency department.  The patient reports that he is supposed be taking a baby aspirin every day but he does not. He says he only takes when he feels like he needs it which is not very often. He continues to smoke. He tells me that he smokes crack and he last used crack cocaine yesterday. He has a history of 3 prior strokes.  The patient was taken for an emergent CT scan of the head which showed evidence of an acute to subacute right frontal lobe infarction without hemorrhage. He is not a candidate for thrombolytic therapy due to the fact that he is way outside the window and artery has evidence of maturing infarct on CT. Code stroke was therefore canceled and he will be admitted for routine stroke evaluation.  Last known well: 07/11/16 NIHSS score: 9 tPA given?: No--way out of the window   SUBJECTIVE (INTERVAL HISTORY) No family members present. The patient states his left upper extremity strength is slightly improved.He is awaiting SNF transfer OBJECTIVE Temp:   [97.7 F (36.5 C)-98.6 F (37 C)] 98.4 F (36.9 C) (06/12 1353) Pulse Rate:  [83-100] 84 (06/12 1353) Cardiac Rhythm: Normal sinus rhythm (06/12 0700) Resp:  [15-20] 20 (06/12 1353) BP: (120-171)/(61-86) 144/80 (06/12 1353) SpO2:  [95 %-100 %] 98 % (06/12 1353)  CBC:   Recent Labs Lab 07/17/16 1655  07/19/16 0917 07/20/16 0650  WBC 6.1  --  5.1 5.9  NEUTROABS 3.2  --   --   --   HGB 14.9  < > 15.2 14.0  HCT 46.6  < > 46.5 43.2  MCV 93.6  --  91.9 91.1  PLT 147*  --  134* 129*  < > = values in this interval not displayed.  Basic Metabolic Panel:   Recent Labs Lab 07/19/16 0917 07/20/16 0650  NA 144 143  K 3.5 3.8  CL 110 111  CO2 24 27  GLUCOSE 156* 116*  BUN 12 14  CREATININE 0.92 0.87  CALCIUM 9.7 9.5    Lipid Panel:     Component Value Date/Time   CHOL 155 07/18/2016 0535   TRIG 121 07/18/2016 0535   HDL 36 (L) 07/18/2016 0535   CHOLHDL 4.3 07/18/2016 0535   VLDL 24 07/18/2016 0535   LDLCALC 95 07/18/2016 0535   HgbA1c:  Lab Results  Component Value Date   HGBA1C 5.8 (H) 07/18/2016   Urine Drug Screen:     Component Value Date/Time   LABOPIA NONE DETECTED 07/17/2016 2234  COCAINSCRNUR POSITIVE (A) 07/17/2016 2234   LABBENZ NONE DETECTED 07/17/2016 2234   AMPHETMU NONE DETECTED 07/17/2016 2234   THCU NONE DETECTED 07/17/2016 2234   LABBARB NONE DETECTED 07/17/2016 2234    Alcohol Level     Component Value Date/Time   ETH <5 07/17/2016 2035    IMAGING  Dg Chest 2 View 07/17/2016  Cardiomegaly without evidence of acute cardiopulmonary disease.    Mr / MRA Brain Wo Contrast 07/18/2016 1. Patchy multifocal acute ischemic right MCA territory infarcts, predominantly cortical and posterior right frontal in location, with involvement of the right pre and postcentral gyri. Associated mild petechial hemorrhage without hemorrhagic transformation. Probable few scattered superimposed more subacute infarcts as above.  2. Moderate chronic microvascular  ischemic disease.   MRA HEAD  1. Absent flow void at the level of the right cavernous/supraclinoid stent with distal reconstitution at the right ICA terminus. Patent flow within the right MCA artery and its branches, although somewhat attenuated as compared to the contralateral left. Vascular patency through the stent could further evaluated with CTA as clinically desired.  2. Otherwise negative intracranial MRA.     Ct Head Code Stroke W/o Cm  07/17/2016 1. Posterior right frontal lobe acute/subacute infarct is more prominent than on the study 2 months ago. This may reflect evolution of the previous infarct or a a progressive new infarct.  2. No other acute infarct.  3. ASPECTS is 9/10  Cerebral catheter angio  07/20/16 : ,1. Approx 50- % intrastent stenosis RT ICA cavernouis seg. 2. approx 20 % stenosis distal portion of  RT ICA, prox stent. 3 .50 % stenosis at origin of innominate artery  .4. Approx 40  To 50 % stenosis Lt ICA supraclinoid seg.     PHYSICAL EXAM  Obese middle aged Caucasian male not in distress. . Afebrile. Head is nontraumatic. Neck is supple without bruit.    Cardiac exam no murmur or gallop. Lungs are clear to auscultation. Distal pulses are well felt.  Neurological Exam ;  Awake  Alert oriented x 3. Normal speech and language.eye movements full without nystagmus.fundi were not visualized. Vision acuity and fields appear normal. Hearing is normal. Palatal movements are normal. Face asymmetric with left lower face weakness. Tongue midline. Left hemiparesis with left upper extremity strength  1/5  And left lower extremity 4/5. Normal sensation. Gait deferred.      ASSESSMENT/PLAN [name] is a 66 y.o. male with history of previous strokes, tobacco use, cocaine use, peripheral vascular disease, obstructive sleep apnea, previous right carotid artery stent, coronary artery disease with previous MI, hypertension, hyperlipidemia, history of lower extremity  DVT, diabetes mellitus, COPD, congestive heart failure, alcohol abuse, anxiety and depression presenting with left upper extremity weakness.  He did not receive IV t-PA due to late presentation.   Stroke:   patchy multifocal acute ischemic right MCA territory infarcts - likely embolic from right ICA stent.  Resultant - Plegic left upper extremity  CT head - Posterior right frontal lobe acute/subacute infarct is more prominent than on the study 2 months ago  MRI head - Patchy multifocal acute ischemic right MCA territory infarcts.  MRA head - Absent flow void at the level of the right cavernous/supraclinoid stent with distal reconstitution at the right ICA terminus.  Carotid Doppler -Bilateral 1-39% ICA stenosis, antegrade vertebral flow. Right stent appears patent. Proximal left external carotid artery appears occluded. CT angio : 1. Negative for emergent large vessel occlusion. 2. There are patent vascular  stents in both the cervical right ICA and the right ICA siphon. However, the right cervical carotid stent is taper distally and this is suspected to be hemodynamically  significant. No definite intracranial in-stent stenosis identified Cerebral catheter angio  07/20/16 : ,1. Approx 50- % intrastent stenosis RT ICA cavernouis seg. 2. approx 20 % stenosis distal portion of  RT ICA, prox stent. 3 .50 % stenosis at origin of innominate artery  .4. Approx 40  To 50 % stenosis Lt ICA supraclinoid seg. 2D Echo - Left ventricle: The cavity size was normal. Wall thickness was   increased in a pattern of mild LVH. Systolic function was normal.    The estimated ejection fraction was in the range of 50% to 55%  LDL - 95  HgbA1c - 5.8  VTE prophylaxis - SCDs Diet Carb Modified Fluid consistency: Thin; Room service appropriate? Yes Diet - low sodium heart healthy  No antithrombotic prior to admission, now on aspirin 325 mg daily  Patient counseled to be compliant with his antithrombotic  medications  Ongoing aggressive stroke risk factor management  Therapy recommendations: SNF Disposition: SNF Hypertension  Stable  Permissive hypertension (OK if < 220/120) but gradually normalize in 5-7 days  Long-term BP goal normotensive  Hyperlipidemia  Home meds:  Lipitor 40 mg daily not resumed in hospital  LDL 95, goal < 70  Consider increasing Lipitor to 80 mg daily  Continue statin at discharge  Diabetes  HgbA1c pending, goal < 7.0  Unc / Controlled   Other Stroke Risk Factors  Advanced age  Cigarette smoker - advised to stop smoking  Obesity, Body mass index is 30.94 kg/m., recommend weight loss, diet and exercise as appropriate   Hx stroke/TIA  Coronary artery disease  Obstructive sleep apnea.  Cocaine use   Other Active Problems  Medical noncompliance with medications.  Previous right carotid stent -> CTA Head and Neck  Nonsustained V. tach and AV block -> cardiology evaluating      Hospital day # 4    I recommend aggressive medical therapy and risk factor modification. Followup in the stroke clinic in 6 weeks. Stroke team will sign off. Call for questions. Antony Contras, MD Medical Director [facility] Pager: [phone] 07/21/2016 2:00 PM   To contact Stroke Continuity provider, please refer to [email or web address] After hours, contact General Neurology

## 2016-07-21 NOTE — Progress Notes (Signed)
Patient left unit on stretcher with PTAR.  All belongings with patient.

## 2016-08-13 ENCOUNTER — Telehealth: Payer: Self-pay | Admitting: Family Medicine

## 2016-08-13 NOTE — Telephone Encounter (Signed)
FYI: Pt was reached by [facility], first visit will be on 7/6.

## 2016-08-13 NOTE — Telephone Encounter (Signed)
Noted  

## 2016-08-24 ENCOUNTER — Inpatient Hospital Stay: Payer: Self-pay | Admitting: Family Medicine

## 2016-08-25 ENCOUNTER — Other Ambulatory Visit: Payer: Self-pay

## 2016-08-25 ENCOUNTER — Emergency Department (HOSPITAL_COMMUNITY): Payer: Medicare Other

## 2016-08-25 ENCOUNTER — Emergency Department (HOSPITAL_COMMUNITY)
Admission: EM | Admit: 2016-08-25 | Discharge: 2016-08-25 | Disposition: A | Discharge status: Home / Self Care | Payer: Medicare Other | Attending: Emergency Medicine | Admitting: Emergency Medicine

## 2016-08-25 ENCOUNTER — Encounter (HOSPITAL_COMMUNITY): Payer: Self-pay | Admitting: Emergency Medicine

## 2016-08-25 DIAGNOSIS — N179 Acute kidney failure, unspecified: Secondary | ICD-10-CM | POA: Diagnosis not present

## 2016-08-25 DIAGNOSIS — I10 Essential (primary) hypertension: Secondary | ICD-10-CM | POA: Insufficient documentation

## 2016-08-25 DIAGNOSIS — Z79899 Other long term (current) drug therapy: Secondary | ICD-10-CM | POA: Insufficient documentation

## 2016-08-25 DIAGNOSIS — F17210 Nicotine dependence, cigarettes, uncomplicated: Secondary | ICD-10-CM | POA: Diagnosis not present

## 2016-08-25 DIAGNOSIS — E119 Type 2 diabetes mellitus without complications: Secondary | ICD-10-CM | POA: Diagnosis not present

## 2016-08-25 DIAGNOSIS — J45909 Unspecified asthma, uncomplicated: Secondary | ICD-10-CM | POA: Diagnosis not present

## 2016-08-25 DIAGNOSIS — J449 Chronic obstructive pulmonary disease, unspecified: Secondary | ICD-10-CM | POA: Insufficient documentation

## 2016-08-25 DIAGNOSIS — R531 Weakness: Secondary | ICD-10-CM

## 2016-08-25 DIAGNOSIS — I2510 Atherosclerotic heart disease of native coronary artery without angina pectoris: Secondary | ICD-10-CM | POA: Insufficient documentation

## 2016-08-25 LAB — DIFFERENTIAL
BASOS PCT: 1 %
Basophils Absolute: 0 10*3/uL (ref 0.0–0.1)
EOS ABS: 0.3 10*3/uL (ref 0.0–0.7)
EOS PCT: 5 %
LYMPHS PCT: 32 %
Lymphs Abs: 2 10*3/uL (ref 0.7–4.0)
MONO ABS: 0.6 10*3/uL (ref 0.1–1.0)
Monocytes Relative: 10 %
NEUTROS PCT: 52 %
Neutro Abs: 3.3 10*3/uL (ref 1.7–7.7)

## 2016-08-25 LAB — CBC
HCT: 37.3 % — ABNORMAL LOW (ref 39.0–52.0)
Hemoglobin: 11.8 g/dL — ABNORMAL LOW (ref 13.0–17.0)
MCH: 29.8 pg (ref 26.0–34.0)
MCHC: 31.6 g/dL (ref 30.0–36.0)
MCV: 94.2 fL (ref 78.0–100.0)
PLATELETS: 142 10*3/uL — AB (ref 150–400)
RBC: 3.96 MIL/uL — AB (ref 4.22–5.81)
RDW: 15.2 % (ref 11.5–15.5)
WBC: 6.3 10*3/uL (ref 4.0–10.5)

## 2016-08-25 LAB — COMPREHENSIVE METABOLIC PANEL
ALBUMIN: 3.8 g/dL (ref 3.5–5.0)
ALT: 22 U/L (ref 17–63)
ANION GAP: 6 (ref 5–15)
AST: 19 U/L (ref 15–41)
Alkaline Phosphatase: 63 U/L (ref 38–126)
BUN: 28 mg/dL — ABNORMAL HIGH (ref 6–20)
CHLORIDE: 104 mmol/L (ref 101–111)
CO2: 29 mmol/L (ref 22–32)
Calcium: 9.5 mg/dL (ref 8.9–10.3)
Creatinine, Ser: 1.41 mg/dL — ABNORMAL HIGH (ref 0.61–1.24)
GFR calc non Af Amer: 50 mL/min — ABNORMAL LOW (ref >60)
GFR, EST AFRICAN AMERICAN: 58 mL/min — AB (ref >60)
GLUCOSE: 115 mg/dL — AB (ref 65–99)
POTASSIUM: 4.1 mmol/L (ref 3.5–5.1)
SODIUM: 139 mmol/L (ref 135–145)
Total Bilirubin: 0.4 mg/dL (ref 0.3–1.2)
Total Protein: 6.3 g/dL — ABNORMAL LOW (ref 6.5–8.1)

## 2016-08-25 LAB — PROTIME-INR
INR: 1.05
PROTHROMBIN TIME: 13.8 s (ref 11.4–15.2)

## 2016-08-25 LAB — APTT: aPTT: 28 seconds (ref 24–36)

## 2016-08-25 LAB — I-STAT TROPONIN, ED: Troponin i, poc: 0 ng/mL (ref 0.00–0.08)

## 2016-08-25 LAB — ETHANOL

## 2016-08-25 MED ORDER — SODIUM CHLORIDE 0.9 % IV BOLUS (SEPSIS)
1000.0000 mL | Freq: Once | INTRAVENOUS | Status: AC
  Administered 2016-08-25: 1000 mL via INTRAVENOUS

## 2016-08-25 NOTE — ED Provider Notes (Signed)
66 yo M with a cc of R face fullness.  Seen initially by Dr. Oleta Mouse, plan for MRI if no acute findings safe for discharge home. On reexam patient is feeling better. MRI of the brain without acute event. We'll have him follow-up with his PCP in the next few days. He is mildly dehydrated on labs will have him eat and drink well.   Deno Etienne, DO 08/25/16 2025

## 2016-08-25 NOTE — ED Notes (Signed)
MD Viviana Simpler at the bedside.

## 2016-08-25 NOTE — Discharge Instructions (Signed)
Eat and drink well for the next couple of days.  Return for worsening symptoms.

## 2016-08-25 NOTE — ED Triage Notes (Signed)
Patient with [facility] from home after waking with increased facial droop and slurred speech.  Patient had a stroke one month ago and has a baseline of some left sided facial droop and severe left arm weakness.  Patient also complains of lateral neck pain bilaterally.  Patient is alert and oriented and in no apparent distress at this time.

## 2016-08-25 NOTE — ED Notes (Signed)
Pt taken to MRI  

## 2016-08-25 NOTE — ED Notes (Signed)
Took over care of patient at this time

## 2016-08-25 NOTE — ED Notes (Signed)
Pt taken to lobby in wheelchair following discharge. Pt located near phone at pt request so he could call friend for a ride.

## 2016-08-25 NOTE — ED Provider Notes (Signed)
[facility] DEPT Provider Note   CSN: [id number] Arrival date & time: 08/25/16  1303     History   Chief Complaint Chief Complaint  Patient presents with  . Weakness    HPI [name] is a 66 y.o. male.  HPI 66 year old male who presents with right facial numbness. He reports that after waking up this morning he had "weird" sensation to the right-sided at his face. He was evaluated by his home PT and health aide who also felt that he had right facial droop and mild slurred speech. He had a reported "low" blood pressure at that time, but recalls it being around 503 systolic. States that he at that time did feel lightheaded, with activity. No focal weakness. He does feel like vision may be a little bit more blurry than normal. No recent fevers, chills, nausea or vomiting, diarrhea, abdominal pain, chest pain, difficulty breathing, cough, urinary symptoms. Does have decreased appetite, and he feels generally weak.  He was recently discharged from the hospital in June from right MCA stroke with residual left-sided weakness. He also has a history of CAD, peripheral vascular disease, and COPD.  Past Medical History:  Diagnosis Date  . Alcohol abuse    H/O  . Anxiety   . Arthritis   . Asthma   . CAD (coronary artery disease) 05/27/10   Cath: severe single vessell CAD left cx midportion obtuse marginal 2 to 3.  [name] CHF (congestive heart failure) ([place])   . COPD (chronic obstructive pulmonary disease) ([place])   . COPD (chronic obstructive pulmonary disease) ([place])   . Depression   . Diabetes mellitus, type 2 ([place]) 03/13/2015  . GERD (gastroesophageal reflux disease)   . HCAP (healthcare-associated pneumonia) 10/15/2015  . History of DVT of lower extremity   . Hyperlipemia   . Hyperlipidemia   . Hypertension   . Myocardial infarction ([place]) 2000  . Orbital fracture ([place]) 12/2012  . OSA on CPAP   . Peripheral vascular disease, unspecified ([place])    08/20/10 doppler: increase in  right ABI post-op. Left ABI stable. S/P bi-fem bypass surgery  . Pneumonia 04/04/2012  . Shortness of breath   . Stroke ([place])   . Tobacco abuse     Patient Active Problem List   Diagnosis Date Noted  . Cerebral thrombosis with cerebral infarction 07/18/2016  . Acute ischemic stroke ([place])   . Left hemiplegia ([place])   . Stroke-like symptoms 07/17/2016  . Insomnia 01/29/2016  . Depression 01/29/2016  . Alcohol abuse with intoxication ([place]) 10/16/2015  . Psoriasiform dermatitis 07/12/2015  . Dementia due to another medical condition 07/12/2015  . Urinary incontinence 03/14/2015  . Diabetes mellitus, type 2 ([place]) 03/13/2015  . Hemiparesis affecting left side as late effect of cerebrovascular accident ([place]) 02/18/2015  . Cerebrovascular accident (CVA) due to stenosis of right carotid artery ([place])   . Coronary artery disease involving native coronary artery of native heart without angina pectoris   . Tachypnea   . Thrombocytopenia ([place])   . History of stroke   . Stroke (cerebrum) ([place]) 02/06/2015  . Arterial ischemic stroke, MCA (middle cerebral artery), right, acute ([place])   . DOE (dyspnea on exertion) 11/23/2014  . PAF (paroxysmal atrial fibrillation) ([place]) 11/23/2014  . HLD (hyperlipidemia) 11/01/2014  . Tobacco use disorder 11/01/2014  . Diastolic CHF, acute on chronic (HCC) 11/01/2014  . Cerebral infarction due to stenosis of right carotid artery ([place]) 11/01/2014  . Carotid stenosis 11/01/2014  . Hyperkalemia 10/24/2014  . H/O  total knee replacement 10/24/2014  . Essential hypertension   . Polysubstance abuse 08/28/2014  . Cocaine abuse   . Intracranial carotid stenosis   . Headache around the eyes 08/27/2014  . Acute CVA (cerebrovascular accident) ([place]) 08/27/2014  . CVA (cerebral vascular accident) ([place]) 08/26/2014  . History of DVT (deep vein thrombosis)   . Alcoholism ([place])   . Embolic stroke ([place])   . Cerebral infarction due to unspecified mechanism   . Hypotension     . AKI (acute kidney injury) ([place])   . Stroke ([place])   . CVA (cerebral infarction) 08/01/2014  . Alcohol dependence with withdrawal with complication ([place]) 75/64/3329  . DVT (deep venous thrombosis) ([place]) 02/22/2014  . Lower extremity edema 02/21/2014  . Chronic diastolic congestive heart failure ([place]) 11/21/2013  . CAD (coronary artery disease) 11/21/2013  . Alcohol abuse 11/21/2013  . GERD (gastroesophageal reflux disease) 08/10/2013  . Gout 08/10/2013  . Tobacco abuse 07/26/2013  . Trichiasis without entropion 04/16/2013  . Dizziness 04/10/2013  . Eyelid lesion 01/24/2013  . Enophthalmos due to trauma 01/19/2013  . Medial orbital wall fracture (HCC) 01/19/2013  . Closed blow-out fracture of floor of orbit ([place]) 01/19/2013  . Binocular vision disorder with diplopia 01/03/2013  . Fracture of inferior orbital wall ([place]) 01/03/2013  . Fracture of orbital floor ([place]) 01/03/2013  . Pain of left eye 01/03/2013  . Peripheral vascular disease ([place]) 07/27/2012  . COPD (chronic obstructive pulmonary disease) ([place]) 04/04/2012  . HTN (hypertension) 04/04/2012    Past Surgical History:  Procedure Laterality Date  . abdoninal ao angio & bifem angio  05/27/10   Patent graft, occluded bil stents with no retrograde flow into the hypogastric arteries. 100% occl left ant. tibial artery, 70% to 80% to 100% stenosis right superficial fem artery above adductor canal. 100 % occl right ant. tibial vessell  . Aortogram w/ PTCA  09/292003  . BACK SURGERY    . CARDIAC CATHETERIZATION  05/27/10   severe CAD left cx  . CHOLECYSTECTOMY    . ESOPHAGOGASTRIC FUNDOPLICATION    . EYE SURGERY    . FEMORAL BYPASS  08/19/10   Right Fem-Pop  . IR ANGIO INTRA EXTRACRAN SEL COM CAROTID INNOMINATE BILAT MOD SED  07/20/2016  . IR ANGIO VERTEBRAL SEL SUBCLAVIAN INNOMINATE UNI R MOD SED  07/20/2016  . IR ANGIO VERTEBRAL SEL VERTEBRAL UNI L MOD SED  07/20/2016  . IR RADIOLOGIST EVAL & MGMT  05/27/2016  . JOINT  REPLACEMENT    . PERIPHERAL VASCULAR CATHETERIZATION N/A 12/10/2014   Procedure: Abdominal Aortogram;  Surgeon: Angelia Mould, MD;  Location: [facility];  Service: Cardiovascular;  Laterality: N/A;  . RADIOLOGY WITH ANESTHESIA N/A 02/08/2015   Procedure: RADIOLOGY WITH ANESTHESIA;  Surgeon: Luanne Bras, MD;  Location: [facility];  Service: Radiology;  Laterality: N/A;  . TEE WITHOUT CARDIOVERSION N/A 08/29/2014   Procedure: TRANSESOPHAGEAL ECHOCARDIOGRAM (TEE);  Surgeon: Josue Hector, MD;  Location[facility];  Service: Cardiovascular;  Laterality: N/A;  . TOTAL KNEE ARTHROPLASTY         Home Medications    Prior to Admission medications   Medication Sig Start Date End Date Taking? Authorizing Provider  ACCU-CHEK SOFTCLIX LANCETS lancets Use 3 times daily before meals 09/17/15   Arnoldo Morale, MD  albuterol (PROAIR HFA) 108 (90 Base) MCG/ACT inhaler Inhale 2 puffs into the lungs every 6 (six) hours as needed for wheezing or shortness of breath. 04/28/16   Arnoldo Morale, MD  albuterol (  PROVENTIL) (2.5 MG/3ML) 0.083% nebulizer solution Take 3 mLs (2.5 mg total) by nebulization every 6 (six) hours as needed for wheezing or shortness of breath. 04/28/16   Arnoldo Morale, MD  allopurinol (ZYLOPRIM) 300 MG tablet Take 1 tablet (300 mg total) by mouth daily. 04/28/16   Arnoldo Morale, MD  aspirin EC 81 MG EC tablet Take 1 tablet (81 mg total) by mouth daily. 07/22/16   Diallo, Earna Coder, MD  atorvastatin (LIPITOR) 80 MG tablet Take 1 tablet (80 mg total) by mouth daily at 6 PM. 07/21/16   Diallo, Abdoulaye, MD  Blood Glucose Monitoring Suppl (ACCU-CHEK AVIVA) device Use 3 times daily before meals. 03/13/15   Arnoldo Morale, MD  carvedilol (COREG) 25 MG tablet Take 0.5 tablets (12.5 mg total) by mouth 2 (two) times daily with a meal. 06/01/16   Arnoldo Morale, MD  clopidogrel (PLAVIX) 75 MG tablet Take 1 tablet (75 mg total) by mouth daily. 04/28/16   Arnoldo Morale, MD  colchicine 0.6 MG  tablet Take 2 tabs (1.2 mg) at the onset of a gout attack, may repeat 1 tablet (0.6 mg) in one hour if symptoms persist 10/16/15   Molt, Bethany, DO  FLUoxetine (PROZAC) 20 MG tablet Take 1 tablet (20 mg total) by mouth daily. 04/28/16   Arnoldo Morale, MD  furosemide (LASIX) 40 MG tablet Take 1.5 tablets (60 mg total) by mouth 2 (two) times daily. 04/28/16   Arnoldo Morale, MD  gabapentin (NEURONTIN) 300 MG capsule Take 2 capsules (600 mg total) by mouth 3 (three) times daily. 04/28/16   Arnoldo Morale, MD  glucose blood (ACCU-CHEK AVIVA) test strip Use 3 times daily before meals as directed. 01/29/16   Arnoldo Morale, MD  hydrALAZINE (APRESOLINE) 50 MG tablet Take 50 mg by mouth daily.  06/18/16   [provider]  HYDROcodone-acetaminophen (NORCO/VICODIN) 5-325 MG tablet Take 1 tablet by mouth every 6 (six) hours as needed. 06/18/16   [provider]  lactulose (CHRONULAC) 10 GM/15ML solution Take 15 mLs (10 g total) by mouth 2 (two) times daily as needed for mild constipation. 04/28/16   Arnoldo Morale, MD  Lancet Devices [facility]) lancets Use 3 times daily before meals 09/27/15   Arnoldo Morale, MD  lisinopril (PRINIVIL,ZESTRIL) 10 MG tablet Take 1 tablet (10 mg total) by mouth daily. 04/28/16   Arnoldo Morale, MD  memantine (NAMENDA) 10 MG tablet Take 1 tablet (10 mg total) by mouth 2 (two) times daily. 04/28/16   Arnoldo Morale, MD  Misc. Devices (ROLLATOR) MISC 1 each by Does not apply route daily. 02/27/15   Arnoldo Morale, MD  pantoprazole (PROTONIX) 40 MG tablet Take 1 tablet (40 mg total) by mouth every morning. 04/28/16   Arnoldo Morale, MD  polyethylene glycol (MIRALAX / GLYCOLAX) packet Take 17 g by mouth daily. 07/22/16   Diallo, Earna Coder, MD  tiZANidine (ZANAFLEX) 4 MG tablet Take 1 tablet (4 mg total) by mouth every 8 (eight) hours as needed for muscle spasms. 04/28/16   Arnoldo Morale, MD  traZODone (DESYREL) 50 MG tablet Take 1 tablet (50 mg total) by mouth at bedtime as needed  for sleep. 04/28/16   Arnoldo Morale, MD  triamcinolone cream (KENALOG) 0.1 % Apply 1 application topically daily as needed (for dermatitis). 06/02/16   Arnoldo Morale, MD    Family History Family History  Problem Relation Age of Onset  . Heart attack Mother   . Heart failure Mother   . Cirrhosis Father   . Heart failure Brother   .  Cancer Brother   . Hypertension Neg Hx        UNKNOWN  . Stroke Neg Hx        UNKNOWN    Social History Social History  Substance Use Topics  . Smoking status: Current Every Day Smoker    Packs/day: 1.00    Years: 50.00    Types: Cigars, Cigarettes    Start date: 02/09/1961  . Smokeless tobacco: Former Systems developer    Types: Chew     Comment: 2 ppd, full flavor  . Alcohol use No     Allergies   Darvocet [propoxyphene n-acetaminophen]; Haldol [haloperidol decanoate]; Acetaminophen; Metformin and related; and Norco [hydrocodone-acetaminophen]   Review of Systems Review of Systems  Respiratory: Negative for cough and shortness of breath.   Cardiovascular: Negative for chest pain.  Gastrointestinal: Negative for abdominal pain.  Psychiatric/Behavioral: Negative for confusion.  All other systems reviewed and are negative.    Physical Exam Updated Vital Signs BP (!) 129/57 (BP Location: Left Arm)   Pulse (!) 59   Temp 97.6 F (36.4 C) (Oral)   Resp 12   SpO2 97%   Physical Exam Physical Exam  Nursing note and vitals reviewed. Constitutional: Chronically ill appearing, non-toxic, and in no acute distress Head: Normocephalic and atraumatic.  Mouth/Throat: Oropharynx is clear and moist.  Neck: Normal range of motion. Neck supple.  Cardiovascular: Normal rate and regular rhythm.   Pulmonary/Chest: Effort normal and breath sounds normal.  Abdominal: Soft. There is no tenderness. There is no rebound and no guarding.  Musculoskeletal: Normal range of motion.  Skin: Skin is warm and dry.  Psychiatric: Cooperative Neurological:  Alert, oriented to  person, place, time, and situation. Memory grossly in tact. Fluent speech. No dysarthria or aphasia.  Cranial nerves: VF are full.  EOMI without nystagmus. No gaze deviation. Facial muscles symmetric with activation, but at rest with slight left facial droop. Sensation to light touch over face in tact bilaterally. Hearing grossly in tact. Palate elevates symmetrically. Head turn and shoulder shrug are intact. Tongue midline.  Reflexes defered.  Muscle bulk and tone normal. Significant hemiparesis of LUE and LLE Sensation to light touch is in tact throughout in bilateral upper and lower extremities. Coordination reveals no dysmetria with finger to nose. Of RUE   ED Treatments / Results  Labs (all labs ordered are listed, but only abnormal results are displayed) Labs Reviewed  CBC - Abnormal; Notable for the following:       Result Value   RBC 3.96 (*)    Hemoglobin 11.8 (*)    HCT 37.3 (*)    Platelets 142 (*)    All other components within normal limits  COMPREHENSIVE METABOLIC PANEL - Abnormal; Notable for the following:    Glucose, Bld 115 (*)    BUN 28 (*)    Creatinine, Ser 1.41 (*)    Total Protein 6.3 (*)    GFR calc non Af Amer 50 (*)    GFR calc Af Amer 58 (*)    All other components within normal limits  ETHANOL  PROTIME-INR  APTT  DIFFERENTIAL  URINALYSIS, ROUTINE W REFLEX MICROSCOPIC  I-STAT TROPOININ, ED    EKG  EKG Interpretation  Date/Time:  Tuesday August 25 2016 13:13:01 EDT Ventricular Rate:  67 PR Interval:  194 QRS Duration: 80 QT Interval:  400 QTC Calculation: 422 R Axis:   47 Text Interpretation:  Normal sinus rhythm Anterior infarct , age undetermined Abnormal ECG no acute changes  Confirmed by Brantley Stage [phone]) on 08/25/2016 4:06:02 PM       Radiology Ct Head Wo Contrast  Result Date: 08/25/2016 CLINICAL DATA:  History of CVA with right face tingling and numbness. EXAM: CT HEAD WITHOUT CONTRAST TECHNIQUE: Contiguous axial images were obtained  from the base of the skull through the vertex without intravenous contrast. COMPARISON:  07/18/2016 FINDINGS: Brain: No evidence of acute infarction, hemorrhage, hydrocephalus, extra-axial collection or mass lesion/mass effect. Expected evolution of high posterior right frontal infarcts seen on 07/17/2016 brain MRI. There is patchy microvascular ischemic injury in the cerebral white matter, overall mild. Vascular: Atherosclerotic calcification. Right intracranial ICA stent. No hyperdense vessel. Skull: No acute finding Sinuses/Orbits: No acute finding. Bilateral cataract resection and left orbital floor repair with plate. IMPRESSION: 1. No acute finding. 2. Known right posterior frontal infarct that occurred 07/17/2016. Electronically Signed   By: Monte Fantasia M.D.   On: 08/25/2016 15:53    Procedures Procedures (including critical care time)  Medications Ordered in ED Medications  sodium chloride 0.9 % bolus 1,000 mL (1,000 mLs Intravenous New Bag/Given 08/25/16 1638)     Initial Impression / Assessment and Plan / ED Course  I have reviewed the triage vital signs and the nursing notes.  Pertinent labs & imaging results that were available during my care of the patient were reviewed by me and considered in my medical decision making (see chart for details).     Presents with right facial droop and right facial numbness onset this morning. Is out of the TPA window. No facial droop currently, she states is now resolved. Does report different sensation over the right side of his face, but is a poor historian, unable to specify further. He has baseline left-sided hemiparesis. Remainder of neuro exam is intact. CT head does not show evidence of acute intracranial processes. Signs of his old right MCA infarct is noted. No intracranial hemorrhage. Still complains of a weird sensation over the right side of his face. I'll obtain an MRI to rule out CVA.  Blood work overall is suggestive of some mild  dehydration. He is acute kidney injury. Has been complaining of some intermittent lightheadedness with position changes and decreased by mouth intake. Is given IV fluids. UA pending.   MRI signed out to Dr. Tyrone Nine. If negative MRI would be felt stable for discharge home.   Final Clinical Impressions(s) / ED Diagnoses   Final diagnoses:  Generalized weakness  AKI (acute kidney injury) [facility])    New Prescriptions New Prescriptions   No medications on file     Forde Dandy, MD 08/25/16 [phone]

## 2016-09-06 IMAGING — NM NM MISC PROCEDURE
6 series · 36 of 36 positions shown · non-contrast
Comparison: none

[Series 1: rest · 6.40mm/px · 6 of 64 frames shown]
[frame 6/64]
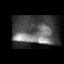
[frame 16/64]
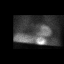
[frame 27/64]
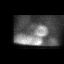
[frame 38/64]
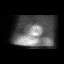
[frame 48/64]
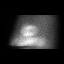
[frame 59/64]
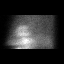

[Series 1: stress-gsp · 6.40mm/px · 6 of 510 frames shown]
[frame 43/510]
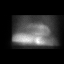
[frame 128/510]
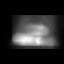
[frame 213/510]
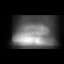
[frame 298/510]
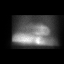
[frame 383/510]
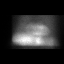
[frame 468/510]
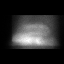

[Series 1: wbr_s-card_st stress-gsp · 6.4mm · 6.40mm/px · 6 of 174 frames shown]
[frame 15/174]
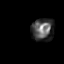
[frame 44/174]
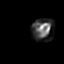
[frame 73/174]
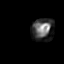
[frame 102/174]
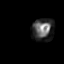
[frame 131/174]
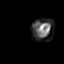
[frame 160/174]
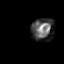

[Series 1: wbr_r-card_st rest · 6.4mm · 6.40mm/px · 6 of 17 frames shown]
[frame 2/17]
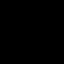
[frame 4/17]
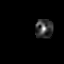
[frame 7/17]
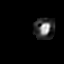
[frame 10/17]
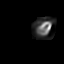
[frame 13/17]
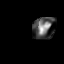
[frame 16/17]
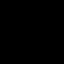

[Series 1: wbr_s-card_st stress-sum-em · 6.4mm · 6.40mm/px · 6 of 21 frames shown]
[frame 2/21]
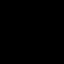
[frame 6/21]
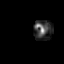
[frame 9/21]
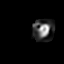
[frame 13/21]
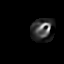
[frame 16/21]
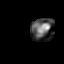
[frame 20/21]
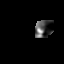

[Series 1: stress-sum-em · 6.40mm/px · 6 of 64 frames shown]
[frame 6/64]
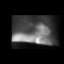
[frame 16/64]
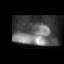
[frame 27/64]
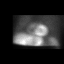
[frame 38/64]
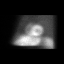
[frame 48/64]
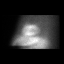
[frame 59/64]
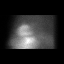

[36 of 36 positions shown; findings below may reference images not displayed]

Canned report from images found in remote index.

Refer to host system for actual result text.

## 2016-09-21 ENCOUNTER — Telehealth: Payer: Self-pay

## 2016-09-21 NOTE — Telephone Encounter (Signed)
Call placed to [name], patient's personal care aide in response to the request for more PCS hours.  Explained to her that the patient will need to be re-assessed by Dr [name] before the request can be completed because it has been greater than 90 days since he was seen by her. [name] stated that she has been told he is eligible for another 30 hours of PCS/month and preferably those hours would be in the evening.  She also noted that he has been placed on a waiting list for CAP services and she was told that he might be able to move up on the waiting list with a recommendation from his provider.

## 2016-09-22 ENCOUNTER — Encounter: Payer: Self-pay | Admitting: Family Medicine

## 2016-09-22 ENCOUNTER — Ambulatory Visit: Payer: Medicare Other | Attending: Family Medicine | Admitting: Family Medicine

## 2016-09-22 VITALS — BP 92/59 | HR 78 | Temp 97.5°F | Wt 226.0 lb

## 2016-09-22 DIAGNOSIS — G4733 Obstructive sleep apnea (adult) (pediatric): Secondary | ICD-10-CM | POA: Diagnosis not present

## 2016-09-22 DIAGNOSIS — J449 Chronic obstructive pulmonary disease, unspecified: Secondary | ICD-10-CM

## 2016-09-22 DIAGNOSIS — Z96659 Presence of unspecified artificial knee joint: Secondary | ICD-10-CM | POA: Insufficient documentation

## 2016-09-22 DIAGNOSIS — I2510 Atherosclerotic heart disease of native coronary artery without angina pectoris: Secondary | ICD-10-CM | POA: Diagnosis not present

## 2016-09-22 DIAGNOSIS — I69354 Hemiplegia and hemiparesis following cerebral infarction affecting left non-dominant side: Secondary | ICD-10-CM

## 2016-09-22 DIAGNOSIS — F329 Major depressive disorder, single episode, unspecified: Secondary | ICD-10-CM | POA: Insufficient documentation

## 2016-09-22 DIAGNOSIS — Z885 Allergy status to narcotic agent status: Secondary | ICD-10-CM | POA: Insufficient documentation

## 2016-09-22 DIAGNOSIS — Z7982 Long term (current) use of aspirin: Secondary | ICD-10-CM | POA: Diagnosis not present

## 2016-09-22 DIAGNOSIS — F0390 Unspecified dementia without behavioral disturbance: Secondary | ICD-10-CM | POA: Diagnosis not present

## 2016-09-22 DIAGNOSIS — F17210 Nicotine dependence, cigarettes, uncomplicated: Secondary | ICD-10-CM | POA: Diagnosis not present

## 2016-09-22 DIAGNOSIS — F17200 Nicotine dependence, unspecified, uncomplicated: Secondary | ICD-10-CM

## 2016-09-22 DIAGNOSIS — Z86718 Personal history of other venous thrombosis and embolism: Secondary | ICD-10-CM | POA: Insufficient documentation

## 2016-09-22 DIAGNOSIS — I10 Essential (primary) hypertension: Secondary | ICD-10-CM | POA: Diagnosis not present

## 2016-09-22 DIAGNOSIS — K219 Gastro-esophageal reflux disease without esophagitis: Secondary | ICD-10-CM | POA: Diagnosis not present

## 2016-09-22 DIAGNOSIS — I739 Peripheral vascular disease, unspecified: Secondary | ICD-10-CM | POA: Diagnosis not present

## 2016-09-22 DIAGNOSIS — E785 Hyperlipidemia, unspecified: Secondary | ICD-10-CM | POA: Diagnosis not present

## 2016-09-22 DIAGNOSIS — E1151 Type 2 diabetes mellitus with diabetic peripheral angiopathy without gangrene: Secondary | ICD-10-CM | POA: Diagnosis not present

## 2016-09-22 DIAGNOSIS — Z951 Presence of aortocoronary bypass graft: Secondary | ICD-10-CM | POA: Diagnosis not present

## 2016-09-22 DIAGNOSIS — Z7902 Long term (current) use of antithrombotics/antiplatelets: Secondary | ICD-10-CM | POA: Insufficient documentation

## 2016-09-22 DIAGNOSIS — I252 Old myocardial infarction: Secondary | ICD-10-CM | POA: Insufficient documentation

## 2016-09-22 DIAGNOSIS — Z955 Presence of coronary angioplasty implant and graft: Secondary | ICD-10-CM | POA: Diagnosis not present

## 2016-09-22 DIAGNOSIS — E1149 Type 2 diabetes mellitus with other diabetic neurological complication: Secondary | ICD-10-CM

## 2016-09-22 DIAGNOSIS — I110 Hypertensive heart disease with heart failure: Secondary | ICD-10-CM | POA: Insufficient documentation

## 2016-09-22 DIAGNOSIS — G4700 Insomnia, unspecified: Secondary | ICD-10-CM | POA: Insufficient documentation

## 2016-09-22 DIAGNOSIS — Z9889 Other specified postprocedural states: Secondary | ICD-10-CM | POA: Insufficient documentation

## 2016-09-22 DIAGNOSIS — Z9049 Acquired absence of other specified parts of digestive tract: Secondary | ICD-10-CM | POA: Diagnosis not present

## 2016-09-22 DIAGNOSIS — I5033 Acute on chronic diastolic (congestive) heart failure: Secondary | ICD-10-CM | POA: Diagnosis not present

## 2016-09-22 DIAGNOSIS — Z888 Allergy status to other drugs, medicaments and biological substances status: Secondary | ICD-10-CM | POA: Insufficient documentation

## 2016-09-22 DIAGNOSIS — Z886 Allergy status to analgesic agent status: Secondary | ICD-10-CM | POA: Diagnosis not present

## 2016-09-22 DIAGNOSIS — F321 Major depressive disorder, single episode, moderate: Secondary | ICD-10-CM

## 2016-09-22 LAB — GLUCOSE, POCT (MANUAL RESULT ENTRY): POC Glucose: 149 mg/dl — AB (ref 70–99)

## 2016-09-22 MED ORDER — PREGABALIN 100 MG PO CAPS: 100.0000 mg | ORAL_CAPSULE | Freq: Two times a day (BID) | ORAL | 3 refills | Status: DC

## 2016-09-22 MED ORDER — LISINOPRIL 5 MG PO TABS: 5.0000 mg | ORAL_TABLET | Freq: Every day | ORAL | 5 refills | Status: DC

## 2016-09-22 NOTE — Progress Notes (Signed)
Subjective:  Patient ID: [name], male    DOB: 1950-06-02  Age: 66 y.o. MRN: [id number]  CC: Hospital follow up  HPI [name] is a 66 year old male with a history of Diabetes mellitus (diet-controlled A1c of 5.8) HTN, CHF (EF 50-55% from 2-D echo of 07/2016), COPD, multiple previous CVAs with residual left hemiparesis, hyperlipidemia, gout, dementia,PVD here for a follow up visit accompanied by his PCS worker -[name]. He suffered a right MCA stroke with left hemiparesis in 07/2016 which has worsened compared to previous weakness. He had presented with worsening left hemiparesis and slurred speech, seen by neurology and aggressive risk factor modification and encouraged.  He is currently undergoing home physical therapy but informs me he has made minimal progress. Received a corticosteroid injection in his left shoulder from orthopedics. He complains of numbness in his extremities and remains on gabapentin. Has an upcoming appointment with neurology next month. Continues to smoke one pack of cigarettes per day and is not willing to quit.  I have reviewed his home blood pressure log which reveals labile blood pressures with systolic blood pressures ranging from 90-145 and today he complains of dizziness present with motion and at rest. He is taking lisinopril 10 mg, carvedilol 12.5 mg and hydralazine 50 mg.  Denies recent COPD flares; insomnia and depression are controlled on medications. He has no additional concerns today and will need completion of new FL2 form for PCS services  Past Medical History:  Diagnosis Date  . Alcohol abuse    H/O  . Anxiety   . Arthritis   . Asthma   . CAD (coronary artery disease) 05/27/10   Cath: severe single vessell CAD left cx midportion obtuse marginal 2 to 3.  [name] CHF (congestive heart failure) ([place])   . COPD (chronic obstructive pulmonary disease) ([place])   . COPD (chronic obstructive pulmonary disease) ([place])   . Depression   .  Diabetes mellitus, type 2 ([place]) 03/13/2015  . GERD (gastroesophageal reflux disease)   . HCAP (healthcare-associated pneumonia) 10/15/2015  . History of DVT of lower extremity   . Hyperlipemia   . Hyperlipidemia   . Hypertension   . Myocardial infarction ([place]) 2000  . Orbital fracture ([place]) 12/2012  . OSA on CPAP   . Peripheral vascular disease, unspecified ([place])    08/20/10 doppler: increase in right ABI post-op. Left ABI stable. S/P bi-fem bypass surgery  . Pneumonia 04/04/2012  . Shortness of breath   . Stroke ([place])   . Tobacco abuse     Past Surgical History:  Procedure Laterality Date  . abdoninal ao angio & bifem angio  05/27/10   Patent graft, occluded bil stents with no retrograde flow into the hypogastric arteries. 100% occl left ant. tibial artery, 70% to 80% to 100% stenosis right superficial fem artery above adductor canal. 100 % occl right ant. tibial vessell  . Aortogram w/ PTCA  09/292003  . BACK SURGERY    . CARDIAC CATHETERIZATION  05/27/10   severe CAD left cx  . CHOLECYSTECTOMY    . ESOPHAGOGASTRIC FUNDOPLICATION    . EYE SURGERY    . FEMORAL BYPASS  08/19/10   Right Fem-Pop  . IR ANGIO INTRA EXTRACRAN SEL COM CAROTID INNOMINATE BILAT MOD SED  07/20/2016  . IR ANGIO VERTEBRAL SEL SUBCLAVIAN INNOMINATE UNI R MOD SED  07/20/2016  . IR ANGIO VERTEBRAL SEL VERTEBRAL UNI L MOD SED  07/20/2016  . IR RADIOLOGIST EVAL & MGMT  05/27/2016  .  JOINT REPLACEMENT    . PERIPHERAL VASCULAR CATHETERIZATION N/A 12/10/2014   Procedure: Abdominal Aortogram;  Surgeon: Angelia Mould, MD;  Location: [facility];  Service: Cardiovascular;  Laterality: N/A;  . RADIOLOGY WITH ANESTHESIA N/A 02/08/2015   Procedure: RADIOLOGY WITH ANESTHESIA;  Surgeon: Luanne Bras, MD;  Location: [facility];  Service: Radiology;  Laterality: N/A;  . TEE WITHOUT CARDIOVERSION N/A 08/29/2014   Procedure: TRANSESOPHAGEAL ECHOCARDIOGRAM (TEE);  Surgeon: Josue Hector, MD;  Location: [facility];   Service: Cardiovascular;  Laterality: N/A;  . TOTAL KNEE ARTHROPLASTY      Allergies  Allergen Reactions  . Darvocet [Propoxyphene N-Acetaminophen] Hives  . Haldol [Haloperidol Decanoate] Hives  . Acetaminophen Nausea Only    Upset stomach, tolerates Hydrocodone/APAP if taken with food  . Metformin And Related Nausea And Vomiting  . Norco [Hydrocodone-Acetaminophen] Nausea And Vomiting    Tolerates if taken with food     Outpatient Medications Prior to Visit  Medication Sig Dispense Refill  . ACCU-CHEK SOFTCLIX LANCETS lancets Use 3 times daily before meals 100 each 5  . albuterol (PROAIR HFA) 108 (90 Base) MCG/ACT inhaler Inhale 2 puffs into the lungs every 6 (six) hours as needed for wheezing or shortness of breath. 8.5 g 3  . albuterol (PROVENTIL) (2.5 MG/3ML) 0.083% nebulizer solution Take 3 mLs (2.5 mg total) by nebulization every 6 (six) hours as needed for wheezing or shortness of breath. 75 mL 3  . allopurinol (ZYLOPRIM) 300 MG tablet Take 1 tablet (300 mg total) by mouth daily. 30 tablet 3  . aspirin EC 81 MG EC tablet Take 1 tablet (81 mg total) by mouth daily. 30 tablet 1  . atorvastatin (LIPITOR) 80 MG tablet Take 1 tablet (80 mg total) by mouth daily at 6 PM. 30 tablet 1  . Blood Glucose Monitoring Suppl (ACCU-CHEK AVIVA) device Use 3 times daily before meals. 1 each 0  . carvedilol (COREG) 25 MG tablet Take 0.5 tablets (12.5 mg total) by mouth 2 (two) times daily with a meal. 60 tablet 3  . clopidogrel (PLAVIX) 75 MG tablet Take 1 tablet (75 mg total) by mouth daily. 30 tablet 3  . FLUoxetine (PROZAC) 20 MG tablet Take 1 tablet (20 mg total) by mouth daily. 30 tablet 3  . furosemide (LASIX) 40 MG tablet Take 1.5 tablets (60 mg total) by mouth 2 (two) times daily. 90 tablet 3  . glucose blood (ACCU-CHEK AVIVA) test strip Use 3 times daily before meals as directed. 100 each 12  . hydrALAZINE (APRESOLINE) 50 MG tablet Take 50 mg by mouth daily.   11  .  HYDROcodone-acetaminophen (NORCO/VICODIN) 5-325 MG tablet Take 1 tablet by mouth every 6 (six) hours as needed.  0  . lactulose (CHRONULAC) 10 GM/15ML solution Take 15 mLs (10 g total) by mouth 2 (two) times daily as needed for mild constipation. 946 mL 2  . Lancet Devices (ACCU-CHEK SOFTCLIX) lancets Use 3 times daily before meals 1 each 5  . memantine (NAMENDA) 10 MG tablet Take 1 tablet (10 mg total) by mouth 2 (two) times daily. 60 tablet 3  . Misc. Devices (ROLLATOR) MISC 1 each by Does not apply route daily. 1 each 0  . pantoprazole (PROTONIX) 40 MG tablet Take 1 tablet (40 mg total) by mouth every morning. 30 tablet 3  . polyethylene glycol (MIRALAX / GLYCOLAX) packet Take 17 g by mouth daily. 14 each 0  . tiZANidine (ZANAFLEX) 4 MG tablet Take 1 tablet (4 mg  total) by mouth every 8 (eight) hours as needed for muscle spasms. 90 tablet 3  . traZODone (DESYREL) 50 MG tablet Take 1 tablet (50 mg total) by mouth at bedtime as needed for sleep. 30 tablet 3  . triamcinolone cream (KENALOG) 0.1 % Apply 1 application topically daily as needed (for dermatitis). 30 g 0  . colchicine 0.6 MG tablet Take 2 tabs (1.2 mg) at the onset of a gout attack, may repeat 1 tablet (0.6 mg) in one hour if symptoms persist 30 tablet 2  . gabapentin (NEURONTIN) 300 MG capsule Take 2 capsules (600 mg total) by mouth 3 (three) times daily. 180 capsule 3  . lisinopril (PRINIVIL,ZESTRIL) 10 MG tablet Take 1 tablet (10 mg total) by mouth daily. 90 tablet 3   No facility-administered medications prior to visit.     ROS Review of Systems Constitutional: Negative for activity change and appetite change.  HENT: Negative for sinus pressure and sore throat.   Eyes: Negative for visual disturbance.  Respiratory: Negative for cough, chest tightness and shortness of breath.   Cardiovascular: Positive for leg swelling  . Negative for chest pain.  Gastrointestinal: Negative for abdominal distention, abdominal pain, constipation  and diarrhea.  Endocrine: Negative.   Genitourinary: Negative for dysuria.  Musculoskeletal: positive for  for joint swelling and myalgias.  Skin: Negative for rash.  Allergic/Immunologic: Negative.   Neurological: positive for weakness, positive light-headedness and positive numbness.  Psychiatric/Behavioral: positive for dysphoric mood and negative for suicidal ideas.   Objective:  BP (!) 92/59   Pulse 78   Temp (!) 97.5 F (36.4 C) (Oral)   Wt 226 lb (102.5 kg)   SpO2 96%   BMI 30.65 kg/m   BP/Weight 09/22/2016 08/25/2016 11/02/2681  Systolic BP 92 419 622  Diastolic BP 59 70 80  Wt. (Lbs) 226 - -  BMI 30.65 - -      Physical Exam Constitutional: He is oriented to person, place, and time. He appears well-developed and well-nourished.  Neck: No JVD present. No tracheal deviation present.  Cardiovascular: Normal rate, regular rhythm and normal heart sounds.   No murmur heard. Unable to palpate dorsalis pedis pulses due to edema  Pulmonary/Chest: Effort normal and breath sounds normal. No respiratory distress. He has no wheezes. He exhibits no tenderness.  Abdominal: Soft. Bowel sounds are normal. He exhibits no mass. There is no tenderness.  Musculoskeletal: Normal range of motion. He exhibits edema (1+ bilateral nonpitting dorsal edema)  Mild tenderness on palpation of left side of back.  Neurological: He is alert and oriented to person, place, and time.  motor strength: 2/5 left upper extremity, 4/5 in left lower extremity. 5/5 in right arm upon right lower extremity Skin: erythema of right sole Psychiatric: He has a normal mood and affect.   Lab Results  Component Value Date   HGBA1C 5.8 (H) 07/18/2016    Assessment & Plan:   1. Type 2 diabetes mellitus with diabetic peripheral angiopathy without gangrene, without long-term current use of insulin (HCC) Diet controlled with last A1c of 5.8 - POCT glucose (manual entry)  2. Type 2 diabetes mellitus with other  neurologic complication, without long-term current use of insulin (HCC) Uncontrolled Switch from gabapentin to Lyrica  3. Tobacco use disorder Spent 3 minutes counseling on cessation and complications of tobacco use but he is not willing to quit at this time.  4. Peripheral vascular disease (HCC) Continue Plavix Risk factor modification  5. Hemiparesis affecting left side as late  effect of cerebrovascular accident [facility]) Undergoing PT  6. Current moderate episode of major depressive disorder without prior episode (HCC) Stable Continue Prozac  7. Essential hypertension He is hypotensive which could explain dizziness Home blood pressures fluctuate Reduce lisinopril from 10 mg to 5 mg.  8. Chronic obstructive pulmonary disease, unspecified COPD type ([place]) No acute flare Uses Proventil MDI as needed Advised that smoking cessation will help retard progression of conditions.  9. Diastolic CHF, acute on chronic (HCC) Euvolemic EF of 50-55% from 07/2016   Meds ordered this encounter  Medications  . lisinopril (PRINIVIL,ZESTRIL) 5 MG tablet    Sig: Take 1 tablet (5 mg total) by mouth daily.    Dispense:  30 tablet    Refill:  5    Discontinue previous dose  . pregabalin (LYRICA) 100 MG capsule    Sig: Take 1 capsule (100 mg total) by mouth 2 (two) times daily.    Dispense:  60 capsule    Refill:  3    Discontinue Gabapentin    Follow-up: Return in about 6 weeks (around 11/03/2016) for Follow-up on hypotension.   This note has been created with Surveyor, quantity. Any transcriptional errors are unintentional.     Arnoldo Morale MD

## 2016-09-23 ENCOUNTER — Telehealth: Payer: Self-pay

## 2016-09-23 NOTE — Telephone Encounter (Signed)
PCS change in status form with request for additional hours was faxed to [facility]  -- fax # [phone]

## 2016-09-24 ENCOUNTER — Telehealth: Payer: Self-pay

## 2016-09-24 NOTE — Telephone Encounter (Signed)
PCA [name] is calling to request a rx for a power wheelchair for patient. Please f/u

## 2016-09-25 ENCOUNTER — Telehealth: Payer: Self-pay

## 2016-09-25 NOTE — Telephone Encounter (Signed)
Call placed to [facility] to confirm receipt of the [facility] request for change in status. Spoke to [place] who stated that the referral has been processed and the patient is scheduled for an assessment on 09/29/16 @ 1430.

## 2016-09-25 NOTE — Telephone Encounter (Signed)
Attempted to contact the patient's PCA, [name] # [phone] regarding the request for a power chair for the patient. HIPAA compliant voicemail message left requesting a call back to # [phone]

## 2016-09-25 NOTE — Telephone Encounter (Signed)
Could you please communicate with her regarding this and requirements? I will need to have paper work by his next OV. Thanks

## 2016-09-28 ENCOUNTER — Telehealth: Payer: Self-pay | Admitting: Family Medicine

## 2016-09-28 NOTE — Telephone Encounter (Signed)
Call placed to Andria Rhein [phone] ([facility] Sales specialist) in regards to getting more information about getting patient a power chair. No answer. Left message for her to return my call at the office [phone].

## 2016-09-29 ENCOUNTER — Telehealth: Payer: Self-pay | Admitting: Family Medicine

## 2016-09-29 NOTE — Telephone Encounter (Signed)
Debbie from [employer] returned my call this morning regarding patient and power wheel chair eligibility. Debbie stated that in order to see if patient qualifies for the power wheel chair it would be best to fax her patient's last office visit notes along with his demographics. Once she receives that they will determine if patient will need a PT evaluation for the power wheel chair (this is based on his insurance benefits). Once the evaluation has been completed, then they will place an order for it. Following that a face to face evaluation will need to be completed, before everything is finalized.   Faxed patient's information to [facility] [phone] (f) as per Debbie's request.

## 2016-09-29 NOTE — Telephone Encounter (Signed)
[place] called to request a verbal order for home health occupational therapy 2 time a weeks for 4 weeks please call them back [phone] Sharyn Lull)

## 2016-09-29 NOTE — Telephone Encounter (Signed)
[name] was called and given verbal orders to start OT.

## 2016-09-30 NOTE — Telephone Encounter (Signed)
Pt. PCA called requesting to speak with a nurse regarding pt. Medication. She states that she does know if pt. Needs to take some medications at the same time. Please f/u.

## 2016-09-30 NOTE — Telephone Encounter (Signed)
Call received from the patient's aide, [name] regarding the status of the power chair order. Explained to her that [facility] has the information needed and will first need to verify the patient's insurance. They will then proceed with obtaining the necessary evaluations   Regarding the PCS referral, [name] stated that the assessment was done yesterday and they will now wait for a letter explaining the determination of PCS needs.

## 2016-09-30 NOTE — Telephone Encounter (Signed)
[name] called to inform that patient took lyrica and 2 tiZANidine  together by mistake.  Advised to take as directed in future.  Should take 1 tiZANidine every 8 hours.  Pt feels better today.

## 2016-10-01 NOTE — Telephone Encounter (Signed)
Take medications as per instructions on bottle.

## 2016-10-05 ENCOUNTER — Telehealth: Payer: Self-pay | Admitting: Family Medicine

## 2016-10-05 NOTE — Telephone Encounter (Signed)
Pt. Nurse called stating that pt. Was drinking coffee and he spilled it all over his Lyrica medication. Pt. Nurse would like to know if he can get another rill authorized. Please f/u

## 2016-10-05 NOTE — Telephone Encounter (Signed)
He has to discuss with his pharmacy and insurance company. They only pay once for 30 day supply and Lyrica is a controlled medication. He might have to continue his gabapentin until next month when he is due for a refill

## 2016-10-05 NOTE — Telephone Encounter (Signed)
Will route to PCP 

## 2016-10-06 ENCOUNTER — Telehealth: Payer: Self-pay

## 2016-10-06 NOTE — Telephone Encounter (Signed)
Prescription for OT to evaluate the patient for a power chair was faxed to [facility].

## 2016-10-06 NOTE — Telephone Encounter (Signed)
Patient's care giver [name] called to make an appointment. Patient can be reached at [phone].[name]

## 2016-10-06 NOTE — Telephone Encounter (Signed)
Pt was called and informed to take gabapentin while waiting for his lyrica.

## 2016-10-07 ENCOUNTER — Telehealth: Payer: Self-pay | Admitting: Family Medicine

## 2016-10-07 NOTE — Telephone Encounter (Signed)
Call placed to Andria Rhein [facility] Sales Specialist) [phone], in regards to the wheel chair document that was faxed for patient. Jackelyn Poling stated that she was not in the office at the moment but that she will stop by the office [facility]) to pick up the document.

## 2016-10-09 ENCOUNTER — Telehealth (HOSPITAL_COMMUNITY): Payer: Self-pay

## 2016-10-09 NOTE — Telephone Encounter (Signed)
Called to schedule, pt's nurse [name] wasn't near his file. Will call back when she is with pt to schedule. AW

## 2016-10-15 ENCOUNTER — Telehealth: Payer: Self-pay | Admitting: Family Medicine

## 2016-10-15 DIAGNOSIS — J449 Chronic obstructive pulmonary disease, unspecified: Secondary | ICD-10-CM

## 2016-10-15 MED ORDER — ALBUTEROL SULFATE HFA 108 (90 BASE) MCG/ACT IN AERS: INHALATION_SPRAY | RESPIRATORY_TRACT | 1 refills | Status: DC

## 2016-10-15 NOTE — Telephone Encounter (Signed)
Will route to PCP 

## 2016-10-15 NOTE — Telephone Encounter (Signed)
He needs an office visit to assess COPD control

## 2016-10-15 NOTE — Telephone Encounter (Signed)
If patient is using albuterol more frequently, he needs to be seen by Dr. [name]. Refilled the albuterol inhaler. Will forward to Dr. [name] to make her aware.

## 2016-10-15 NOTE — Telephone Encounter (Signed)
Patient called requesting medication refill on albuterol (PROAIR HFA) 108 (90 Base) MCG/ACT inhaler, pt has been experiencing more SHOB and has been using inhaler a lot more, also mentioned if maybe dosage change could be made for it to help more. Please f/up

## 2016-10-16 ENCOUNTER — Telehealth: Payer: Self-pay | Admitting: Family Medicine

## 2016-10-16 NOTE — Telephone Encounter (Signed)
Sharyn Lull form [place] care requesting verbal orders for OT 2 times a week for 3 weeks effective 09/10, please f/up

## 2016-10-16 NOTE — Telephone Encounter (Signed)
Pt was called and informed of ov needed for refill.

## 2016-10-19 ENCOUNTER — Encounter: Payer: Self-pay | Admitting: Neurology

## 2016-10-19 ENCOUNTER — Ambulatory Visit (INDEPENDENT_AMBULATORY_CARE_PROVIDER_SITE_OTHER): Payer: Medicare Other | Admitting: Neurology

## 2016-10-19 ENCOUNTER — Other Ambulatory Visit: Payer: Self-pay

## 2016-10-19 VITALS — BP 102/59 | HR 70 | Wt 229.6 lb

## 2016-10-19 DIAGNOSIS — G8112 Spastic hemiplegia affecting left dominant side: Secondary | ICD-10-CM

## 2016-10-19 DIAGNOSIS — I639 Cerebral infarction, unspecified: Secondary | ICD-10-CM

## 2016-10-19 DIAGNOSIS — M62838 Other muscle spasm: Secondary | ICD-10-CM

## 2016-10-19 MED ORDER — TIZANIDINE HCL 2 MG PO TABS: 2.0000 mg | ORAL_TABLET | Freq: Three times a day (TID) | ORAL | 3 refills | Status: DC

## 2016-10-19 NOTE — Telephone Encounter (Signed)
I spoke with the PCP and she auth the verbal order 6 visit for 6 month

## 2016-10-19 NOTE — Progress Notes (Signed)
[facility] [address] [place]. [place] [address] [phone]       OFFICE FOLLOW-UP NOTE  Kenneth. [name] Date of Birth:  12-18-1950 Medical Record Number:  [id number]   HPI: [name] is a52 year caucasian male seen today for the first office follow-up visit following hospital admission for stroke in June 2018. He is accompanied by his home health aide. I have personally reviewed hospital electronic records, imaging films and stroke workup.[name] is a 66 y.o. male with history of previous strokes, tobacco use, cocaine use, peripheral vascular disease, obstructive sleep apnea, previous right carotid artery stent, coronary artery disease with previous MI, hypertension, hyperlipidemia, history of lower extremity DVT, diabetes mellitus, COPD, congestive heart failure, alcohol abuse, anxiety and depression presented to [facility] on 6/8/18with left upper extremity weakness.since 07/11/16.  He did not receive IV t-PA due to late presentation.He describes difficulty moving his left arm and says that he was unable to hold onto anything with his left hand. He states that he did not seek medical attention at that time because he "had other things to do." Today, he noticed worsening of the weakness in his left arm, and now is unable to move it at all. In addition, he has noticed some increasing weakness in his left leg today as well as some slurred speech. His healthcare aide reports that she noticed he was having difficulty walking and not why she had him brought to the emergency department.The patient reports that he is supposed be taking a baby aspirin every day but he does not. He says he only takes when he feels like he needs it which is not very often. He continues to smoke. He tells me that he smokes crack and he last used crack cocaine yesterday. He has a history of 3 prior strokes. The patient was taken for an emergent CT scan of the head which showed evidence of an  acute to subacute right frontal lobe infarction without hemorrhage. He is not a candidate for thrombolytic therapy due to the fact that he is way outside the window and artery has evidence of maturing infarct on CT. Code stroke was therefore canceled and he will be admitted for routine stroke evaluation.Last known well: 07/11/16 TPA given : No outside time window.NIHSS score: 9. MRI scan of the brain showed patchy multifocal acute ischemic right MCA branch infarcts involving predominantly posterior right frontal cortex with pre-and postcentral gyri. There are small petechial hemorrhage. There are a few more scattered superimposed subacute infarcts in that region as well. MRA of the brain showed absent flow void in the right cavernous/supraclinoid stent with distal reconstitution at the right ICA terminus. Cerebral catheter angiogram performed on 07/20/16 showed 50% into stent stenosis in the right cavernous ICA and 20% stenosis distal to the proximal right ICA stent. The 50% stenosis at origin of the innominate artery and 40-50% stenosis of the left ICA and the supraclinoid segments. Transthoracic echo showed normal ejection fraction without cardiac source of embolism. LDL cholesterol was elevated 95 mg percent and hemoglobin A1c was borderline at 5.8. Patient was started on aspirin and Plavix as well as Lipitor and transferred to skilled nursing facility. He is made gradual progress and is currently living at home with home health aides which provide helpful 80 hours per week. Patient still has significant weakness in his left upper extremity and is barely able to bend his fingers and use it. He has cut back smoking significantly from 2 pack  per day now to half pack per day and is trying to quit completely. He has been on aspirin and Plavix but does complain of increase bruising. He is currently getting home physical therapy but for some reason occupational therapy has been stopped. He does complain of increased pain  and stiffness in his left side including involuntary tremors probably clonus. He is able to walk with a walker or a cane but does need a one-person assist to do so. She spends most of his time in a wheelchair. He has applied to get a powered wheelchair and has an assessment for the same pending later this week. Patient has a prescription for Zanaflex was previous chronic headaches but has not been taking it regularly.  ROS:   14 system review of systems is positive for  follow-up leg swelling, shortness of breath, cough, wheezing, snoring, diarrhea, constipation, easy bruising, joint pain and swelling, cramps, aching muscles, memory loss, confusion, numbness, weakness, slurred speech, depression, anxiety, not to sleep, decreased energy, change in appetite, disinterest in activities, insomnia, sleepiness, restless legs and all other systems negative  PMH:  Past Medical History:  Diagnosis Date  . Alcohol abuse    H/O  . Anxiety   . Arthritis   . Asthma   . CAD (coronary artery disease) 05/27/10   Cath: severe single vessell CAD left cx midportion obtuse marginal 2 to 3.  [name] CHF (congestive heart failure) ([place])   . COPD (chronic obstructive pulmonary disease) ([place])   . COPD (chronic obstructive pulmonary disease) ([place])   . Depression   . Diabetes mellitus, type 2 ([place]) 03/13/2015  . GERD (gastroesophageal reflux disease)   . HCAP (healthcare-associated pneumonia) 10/15/2015  . History of DVT of lower extremity   . Hyperlipemia   . Hyperlipidemia   . Hypertension   . Myocardial infarction ([place]) 2000  . Orbital fracture ([place]) 12/2012  . OSA on CPAP   . Peripheral vascular disease, unspecified ([place])    08/20/10 doppler: increase in right ABI post-op. Left ABI stable. S/P bi-fem bypass surgery  . Pneumonia 04/04/2012  . Shortness of breath   . Stroke ([place])   . Tobacco abuse     Social History:  Social History   Social History  . Marital status: Single    Spouse name: N/A  . Number of  children: N/A  . Years of education: N/A   Occupational History  . Not on file.   Social History Main Topics  . Smoking status: Current Every Day Smoker    Packs/day: 0.50    Years: 50.00    Types: Cigars, Cigarettes    Start date: 02/09/1961  . Smokeless tobacco: Former Systems developer    Types: Chew     Comment: 2 ppd, full flavor  . Alcohol use No  . Drug use: No  . Sexual activity: Yes   Other Topics Concern  . Not on file   Social History Narrative   Recovering alcoholic    Medications:   Current Outpatient Prescriptions on File Prior to Visit  Medication Sig Dispense Refill  . ACCU-CHEK SOFTCLIX LANCETS lancets Use 3 times daily before meals 100 each 5  . albuterol (PROAIR HFA) 108 (90 Base) MCG/ACT inhaler Inhale 2 puffs into the lungs every 6 (six) hours as needed for wheezing or shortness of breath. 8.5 g 1  . albuterol (PROVENTIL) (2.5 MG/3ML) 0.083% nebulizer solution Take 3 mLs (2.5 mg total) by nebulization every 6 (six) hours as needed for wheezing or shortness  of breath. 75 mL 3  . allopurinol (ZYLOPRIM) 300 MG tablet Take 1 tablet (300 mg total) by mouth daily. 30 tablet 3  . atorvastatin (LIPITOR) 80 MG tablet Take 1 tablet (80 mg total) by mouth daily at 6 PM. 30 tablet 1  . Blood Glucose Monitoring Suppl (ACCU-CHEK AVIVA) device Use 3 times daily before meals. 1 each 0  . carvedilol (COREG) 25 MG tablet Take 0.5 tablets (12.5 mg total) by mouth 2 (two) times daily with a meal. 60 tablet 3  . clopidogrel (PLAVIX) 75 MG tablet Take 1 tablet (75 mg total) by mouth daily. 30 tablet 3  . glucose blood (ACCU-CHEK AVIVA) test strip Use 3 times daily before meals as directed. 100 each 12  . hydrALAZINE (APRESOLINE) 50 MG tablet Take 50 mg by mouth daily.   11  . lactulose (CHRONULAC) 10 GM/15ML solution Take 15 mLs (10 g total) by mouth 2 (two) times daily as needed for mild constipation. 946 mL 2  . Lancet Devices (ACCU-CHEK SOFTCLIX) lancets Use 3 times daily before meals 1  each 5  . lisinopril (PRINIVIL,ZESTRIL) 5 MG tablet Take 1 tablet (5 mg total) by mouth daily. 30 tablet 5  . memantine (NAMENDA) 10 MG tablet Take 1 tablet (10 mg total) by mouth 2 (two) times daily. 60 tablet 3  . Misc. Devices (ROLLATOR) MISC 1 each by Does not apply route daily. 1 each 0  . pantoprazole (PROTONIX) 40 MG tablet Take 1 tablet (40 mg total) by mouth every morning. 30 tablet 3  . polyethylene glycol (MIRALAX / GLYCOLAX) packet Take 17 g by mouth daily. 14 each 0  . pregabalin (LYRICA) 100 MG capsule Take 1 capsule (100 mg total) by mouth 2 (two) times daily. 60 capsule 3  . traZODone (DESYREL) 50 MG tablet Take 1 tablet (50 mg total) by mouth at bedtime as needed for sleep. 30 tablet 3  . triamcinolone cream (KENALOG) 0.1 % Apply 1 application topically daily as needed (for dermatitis). 30 g 0   No current facility-administered medications on file prior to visit.     Allergies:   Allergies  Allergen Reactions  . Darvocet [Propoxyphene N-Acetaminophen] Hives  . Haldol [Haloperidol Decanoate] Hives  . Acetaminophen Nausea Only    Upset stomach, tolerates Hydrocodone/APAP if taken with food  . Metformin And Related Nausea And Vomiting  . Norco [Hydrocodone-Acetaminophen] Nausea And Vomiting    Tolerates if taken with food    Physical Exam General: well developed, well nourished middle aged Caucasian male, seated, in no evident distress Head: head normocephalic and atraumatic.  Neck: supple with no carotid or supraclavicular bruits Cardiovascular: regular rate and rhythm, no murmurs Musculoskeletal: no deformity Skin:  no rash/petichiae Vascular:  Normal pulses all extremities Vitals:   10/19/16 0917  BP: (!) 102/59  Pulse: 70   Neurologic Exam Mental Status: Awake and fully alert. Oriented to place and time. Recent and remote memory intact. Attention span, concentration and fund of knowledge appropriate. Mood and affect appropriate.  Cranial Nerves: Fundoscopic  exam reveals sharp disc margins. Pupils equal, briskly reactive to light. Extraocular movements full without nystagmus. Visual fields full to confrontation. Hearing intact. Facial sensation intact. Face, tongue, palate moves normally and symmetrically.  Motor: Increased tone in the left upper and lower eczema to the. Left upper eczema distend 2/5 proximally and significant weakness of left grip and intrinsic hand muscles. Barely able to bend fingers in the left hand. Left lower extremity strength is 4/5 with  mild weakness of hip and ankle dorsiflexors. Normal strength tone and reflexes on the right side. Sensory.: intact to touch ,pinprick .position and vibratory sensation.  Coordination: Rapid alternating movements normal in all extremities. Finger-to-nose and heel-to-shin performed accurately bilaterally. Gait and Station: Not tested as patient did not get a walker and is in a wheelchair Reflexes: 1+ and symmetric. Toes downgoing.   NIHSS  4 Modified Rankin 4   ASSESSMENT: 64 year Caucasian male with subacute and acute right frontal MCA branch infarcts in June 2018 secondary to right cavernous carotid occlusion and distal recannulization due to large vessel disease. Multiple vascular risk factors of previous strokes, tobacco abuse, cocaine abuse, peripheral vascular disease, obstructive sleep apnea, previous right carotid artery stent, coronary artery disease with previous MI, hypertension, hyperlipidemia, diabetes, smoking,alcohol abuse and congestive heart failure    PLAN: I had a long d/w patient about his recent stroke,carotid stenosis risk for recurrent stroke/TIAs, personally independently reviewed imaging studies and stroke evaluation results and answered questions.Continue Plavix 75 mg daily but discontinue aspirin due to bruising for secondary stroke prevention and maintain strict control of hypertension with blood pressure goal below 130/90, diabetes with hemoglobin A1c goal below 6.5%  and lipids with LDL cholesterol goal below 70 mg/dL.  I have complimented him on cutting back smoking and advise him to quit it completely. I recommend home physical and occupational therapy and start Zanaflex 2 mg 3 times daily to help with his post stroke spasticity and pain in the left arm. Followup in the future with my nurse practitioner in 6 months or call earlier if necessary Greater than 50% of time during this 25 minute visit was spent on counseling,explanation of diagnosis of strokes, occlusive cerebrovascular disease planning of further management, discussion with patient and family and coordination of care Antony Contras, MD  [facility] [address] [place], [place] [address]  Phone [phone] Fax [phone] Note: This document was prepared with digital dictation and possible smart phrase technology. Any transcriptional errors that result from this process are unintentional

## 2016-10-19 NOTE — Patient Instructions (Signed)
I had a long d/w patient about his recent stroke,carotid stenosis risk for recurrent stroke/TIAs, personally independently reviewed imaging studies and stroke evaluation results and answered questions.Continue Plavix 75 mg daily but discontinue aspirin due to bruising for secondary stroke prevention and maintain strict control of hypertension with blood pressure goal below 130/90, diabetes with hemoglobin A1c goal below 6.5% and lipids with LDL cholesterol goal below 70 mg/dL.  I have complimented him on cutting back smoking and advise him to quit it completely. I recommend home physical and occupational therapy and start Zanaflex 2 mg 3 times daily to help with his post stroke spasticity and pain in the left arm. Followup in the future with my nurse practitioner in 6 months or call earlier if necessary Stroke Prevention Some medical conditions and behaviors are associated with an increased chance of having a stroke. You may prevent a stroke by making healthy choices and managing medical conditions. How can I reduce my risk of having a stroke?  Stay physically active. Get at least 30 minutes of activity on most or all days.  Do not smoke. It may also be helpful to avoid exposure to secondhand smoke.  Limit alcohol use. Moderate alcohol use is considered to be: ? No more than 2 drinks per day for men. ? No more than 1 drink per day for nonpregnant women.  Eat healthy foods. This involves: ? Eating 5 or more servings of fruits and vegetables a day. ? Making dietary changes that address high blood pressure (hypertension), high cholesterol, diabetes, or obesity.  Manage your cholesterol levels. ? Making food choices that are high in fiber and low in saturated fat, trans fat, and cholesterol may control cholesterol levels. ? Take any prescribed medicines to control cholesterol as directed by your health care provider.  Manage your diabetes. ? Controlling your carbohydrate and sugar intake is  recommended to manage diabetes. ? Take any prescribed medicines to control diabetes as directed by your health care provider.  Control your hypertension. ? Making food choices that are low in salt (sodium), saturated fat, trans fat, and cholesterol is recommended to manage hypertension. ? Ask your health care provider if you need treatment to lower your blood pressure. Take any prescribed medicines to control hypertension as directed by your health care provider. ? If you are 8-72 years of age, have your blood pressure checked every 3-5 years. If you are 34 years of age or older, have your blood pressure checked every year.  Maintain a healthy weight. ? Reducing calorie intake and making food choices that are low in sodium, saturated fat, trans fat, and cholesterol are recommended to manage weight.  Stop drug abuse.  Avoid taking birth control pills. ? Talk to your health care provider about the risks of taking birth control pills if you are over 47 years old, smoke, get migraines, or have ever had a blood clot.  Get evaluated for sleep disorders (sleep apnea). ? Talk to your health care provider about getting a sleep evaluation if you snore a lot or have excessive sleepiness.  Take medicines only as directed by your health care provider. ? For some people, aspirin or blood thinners (anticoagulants) are helpful in reducing the risk of forming abnormal blood clots that can lead to stroke. If you have the irregular heart rhythm of atrial fibrillation, you should be on a blood thinner unless there is a good reason you cannot take them. ? Understand all your medicine instructions.  Make sure that other conditions (  such as anemia or atherosclerosis) are addressed. Get help right away if:  You have sudden weakness or numbness of the face, arm, or leg, especially on one side of the body.  Your face or eyelid droops to one side.  You have sudden confusion.  You have trouble speaking  (aphasia) or understanding.  You have sudden trouble seeing in one or both eyes.  You have sudden trouble walking.  You have dizziness.  You have a loss of balance or coordination.  You have a sudden, severe headache with no known cause.  You have new chest pain or an irregular heartbeat. Any of these symptoms may represent a serious problem that is an emergency. Do not wait to see if the symptoms will go away. Get medical help at once. Call your local emergency services (911 in [place] Do not drive yourself to the hospital. This information is not intended to replace advice given to you by your health care provider. Make sure you discuss any questions you have with your health care provider. Document Released: 03/05/2004 Document Revised: 07/04/2015 Document Reviewed: 07/29/2012 Elsevier Interactive Patient Education  2017 [employer].

## 2016-11-09 IMAGING — MR MR HEAD W/O CM
8 of 10 series · 35 of 48 positions shown · non-contrast
Comparison: Head CT without contrast 3000 hours today. Brain MRI
08/27/2014.

CLINICAL DATA: 64-year-old male with left side weakness beginning 2
days ago. Initial encounter.

EXAM:
MRI HEAD WITHOUT CONTRAST
TECHNIQUE: Multiplanar, multiecho pulse sequences of the brain and surrounding
structures were obtained without intravenous contrast.

[Series 3: DWI · axial · 3.0mm · 1.09mm/px · z∈[-64,+73]mm · 9 of 94 slices shown (1 of 4)]
[im 1/94]
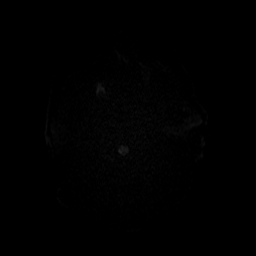
[im 12/94]
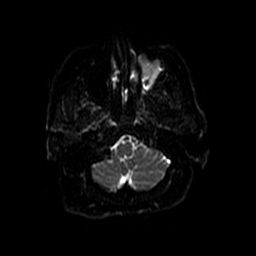
[im 24/94]
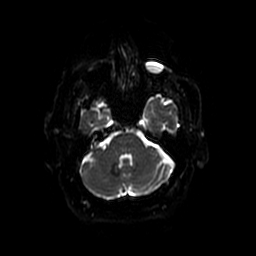
[im 35/94]
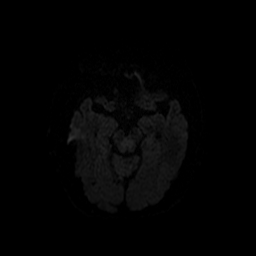
[im 47/94]
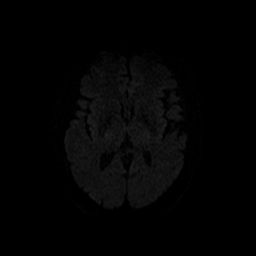
[im 59/94]
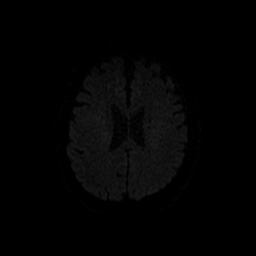
[im 70/94]
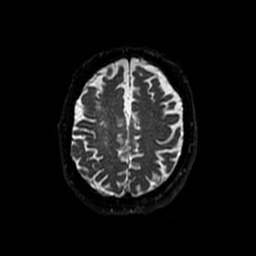
[im 82/94]
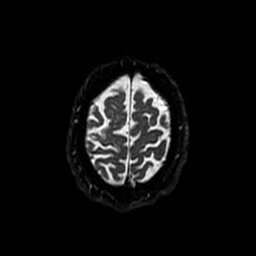
[im 94/94]
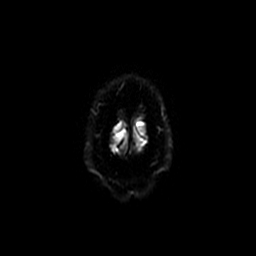

[Series 4: DWI · coronal · 5.0mm · 1.09mm/px · 7 of 66 slices shown (2 of 4)]
[im 1/66]
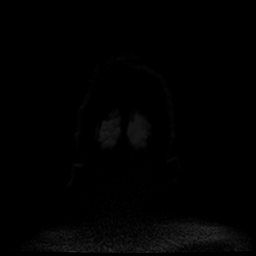
[im 11/66]
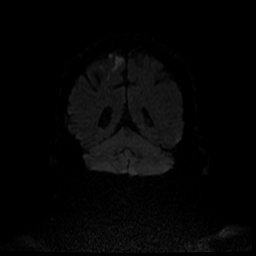
[im 22/66]
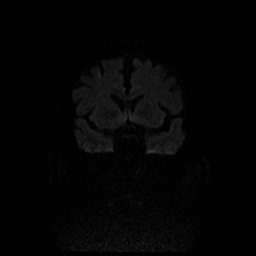
[im 33/66]
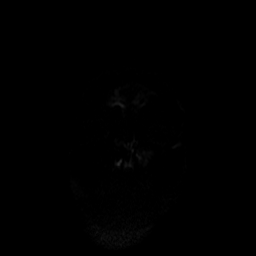
[im 44/66]
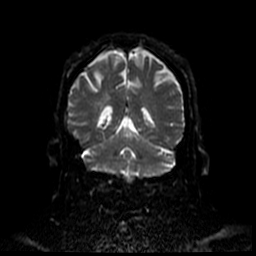
[im 55/66]
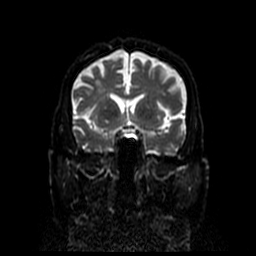
[im 66/66]
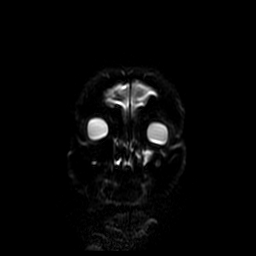

[Series 5: T1 · sagittal · 5.0mm · 0.47mm/px · 2 of 23 slices shown]
[im 1/23]
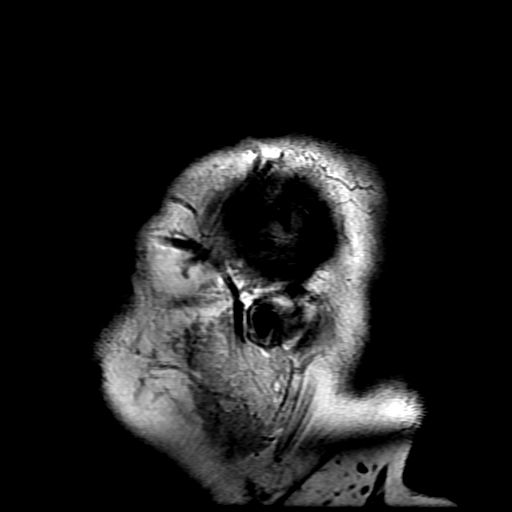
[im 23/23]
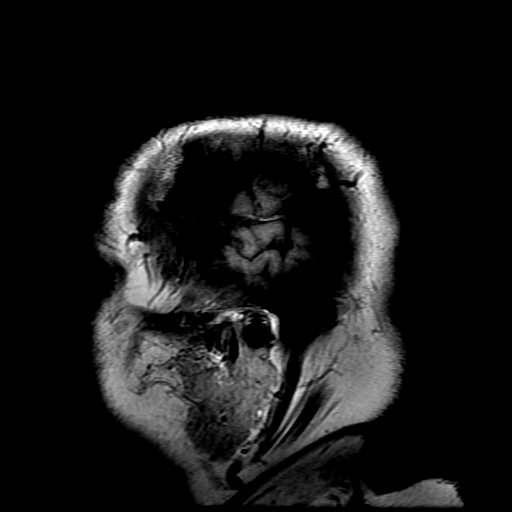

[Series 6: T2 · axial · 5.0mm · 0.47mm/px · z∈[-71,+72]mm · 3 of 25 slices shown (1 of 2)]
[im 1/25]
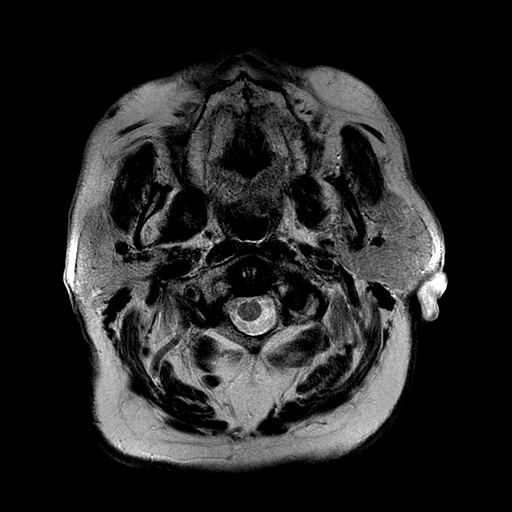
[im 13/25]
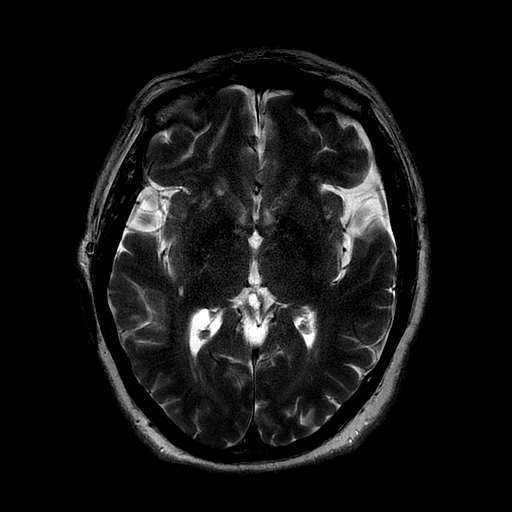
[im 25/25]
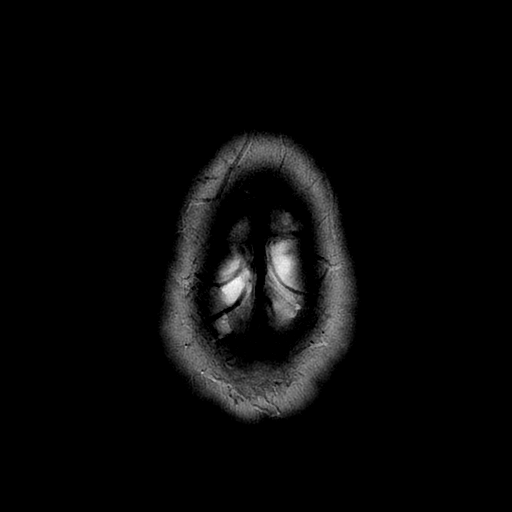

[Series 7: FLAIR · axial · 5.0mm · 0.47mm/px · z∈[-71,+72]mm · 3 of 25 slices shown]
[im 1/25]
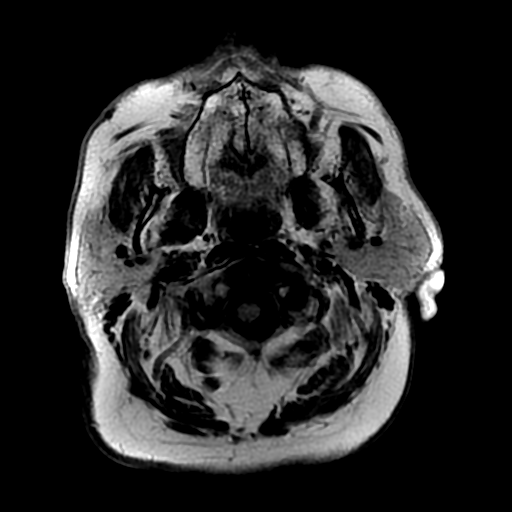
[im 13/25]
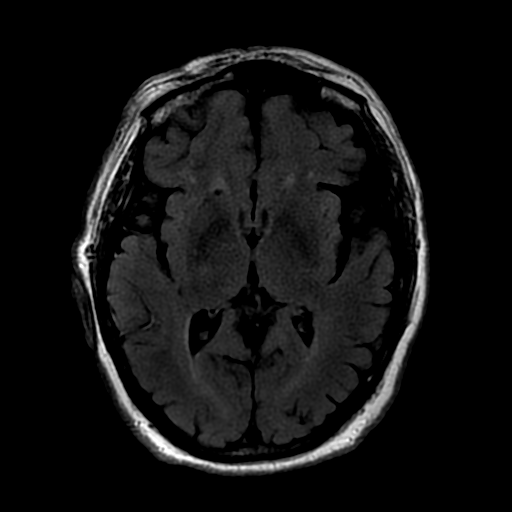
[im 25/25]
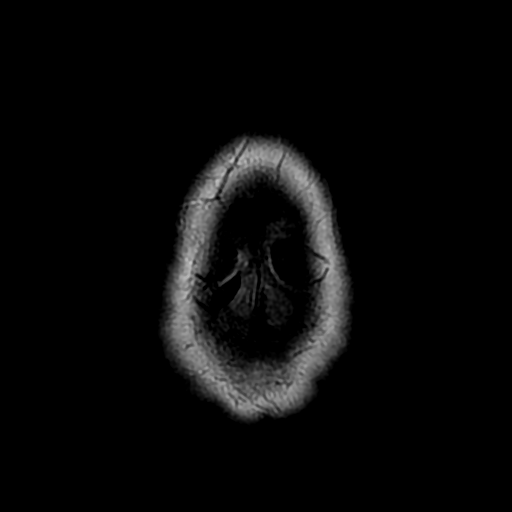

[Series 10: T2 · coronal · 5.0mm · 0.43mm/px · 3 of 28 slices shown (2 of 2)]
[im 1/28]
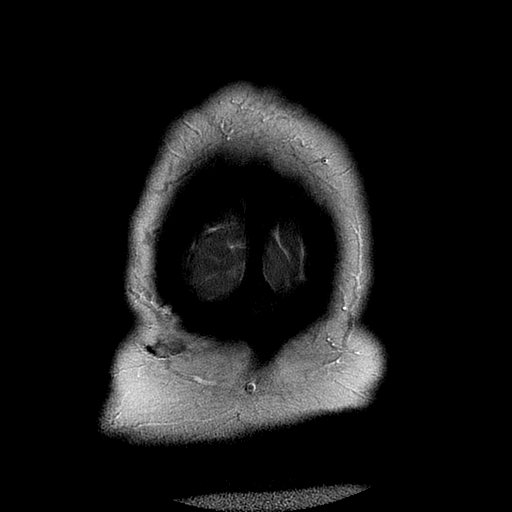
[im 14/28]
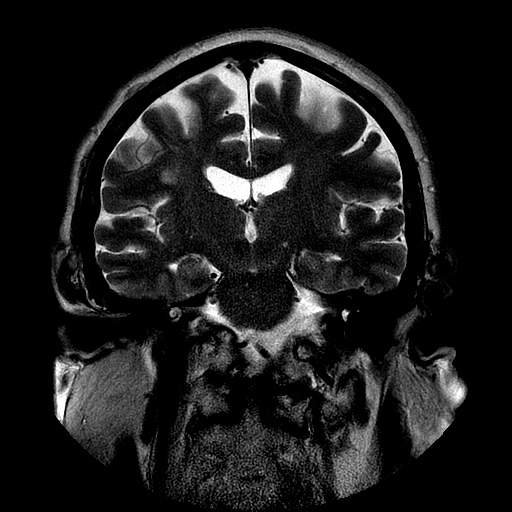
[im 28/28]
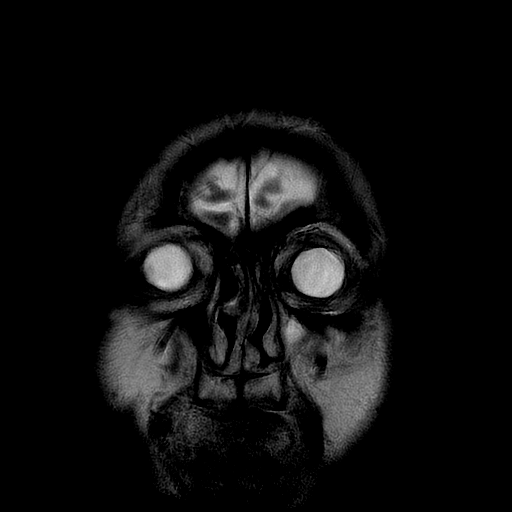

[Series 300: DWI · axial · 3.0mm · 1.09mm/px · z∈[-64,+73]mm · 5 of 47 slices shown (3 of 4)]
[im 1/47]
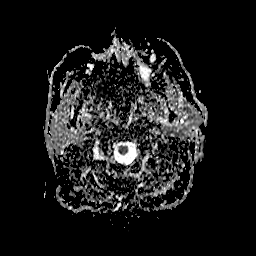
[im 12/47]
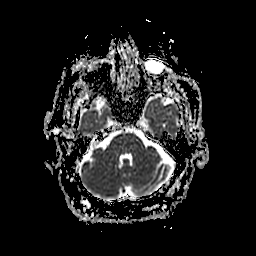
[im 24/47]
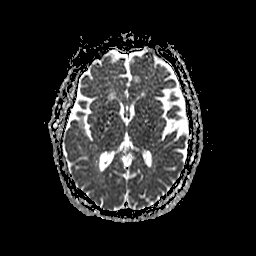
[im 35/47]
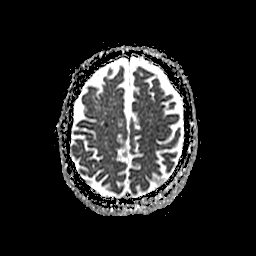
[im 47/47]
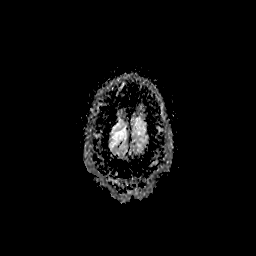

[Series 400: DWI · coronal · 5.0mm · 1.09mm/px · 3 of 33 slices shown (4 of 4)]
[im 1/33]
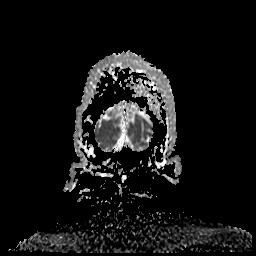
[im 17/33]
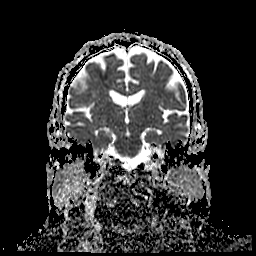
[im 33/33]
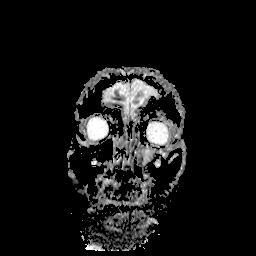

[35 of 48 positions shown; findings below may reference images not displayed]

FINDINGS: Chronically abnormal appearance of the right ICA siphon, but with
suggestion of reconstituted flow at the right ICA terminus. Overall
Major intracranial vascular flow voids are stable since [REDACTED].

Patchy mostly cortically based restricted diffusion in the right
superior frontal gyrus involving the MCA territory (some MCA/ACA
watershed area in the pre motor region). White matter extension to
the right corona radiata on series 4, image 18.

No contralateral left hemisphere or posterior fossa restricted
diffusion.

Associated patchy T2 and FLAIR hyperintensity in the acutely
affected areas. No definite associated acute hemorrhage, although
there is probably vascular related T2* asymmetry along the medial
right hemisphere (series 8, images 16-19) which is new since [REDACTED].
Chronic micro hemorrhage in the right occipital lobe is unchanged.

Widespread scattered cerebral white matter T2 and FLAIR
hyperintensity elsewhere in both hemispheres, greater on the right,
then is stable since [REDACTED].

No midline shift, mass effect, evidence of mass lesion,
ventriculomegaly, extra-axial collection or acute intracranial
hemorrhage. Cervicomedullary junction and pituitary are within
normal limits. Negative visualized cervical spine.

Stable paranasal sinuses and mastoids. Interval postoperative
changes to the left globe, otherwise stable orbit and scalp soft
tissues. Visualized bone marrow signal is within normal limits.
IMPRESSION: 1. Patchy acute right MCA and MCA/ACA watershed infarcts with no
associated mass effect or hemorrhage.
2. Chronic poor flow in the right ICA - See also 08/27/2014 Head MRA
report.

## 2016-11-09 IMAGING — DX DG CHEST 2V
4 series · 4 of 4 positions shown · non-contrast
Comparison: 08/19/2014 chest radiograph.

CLINICAL DATA: CVA.

EXAM:
CHEST  2 VIEW

[chest lat]
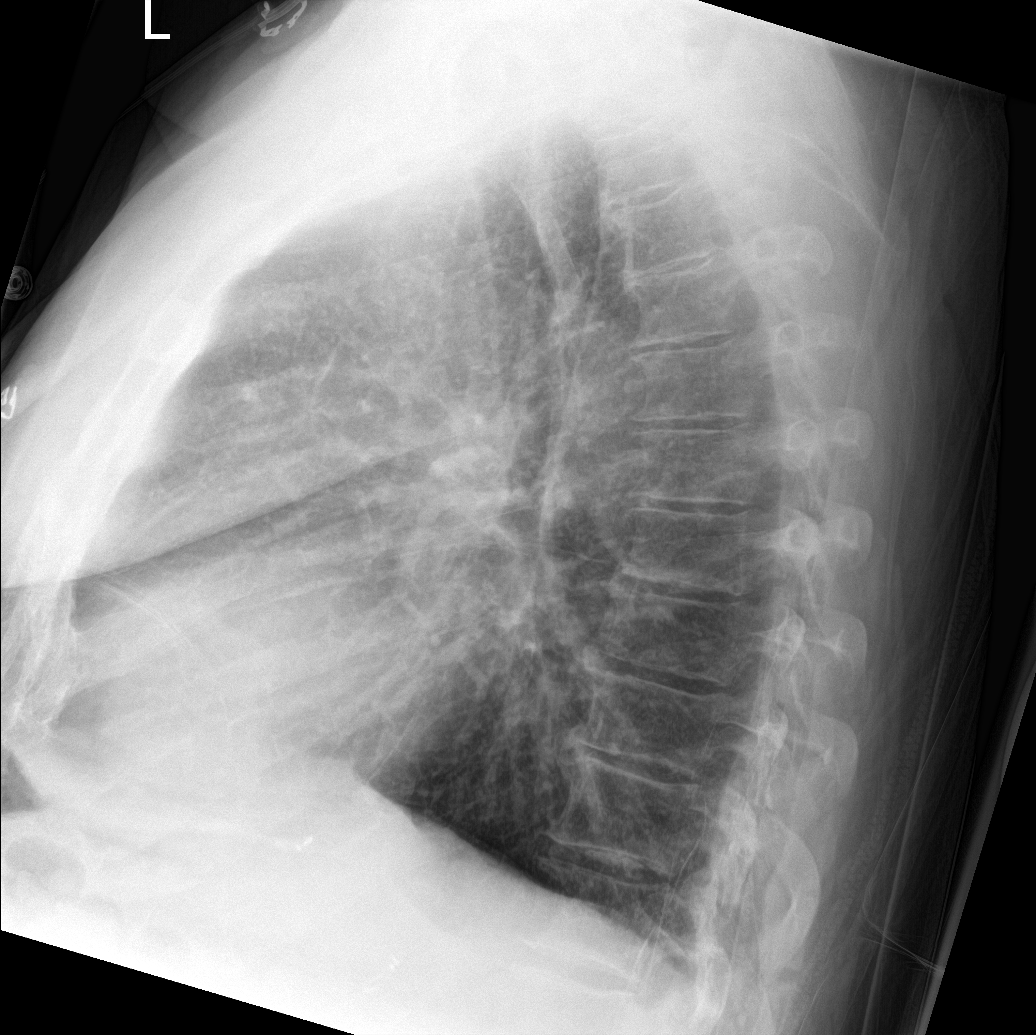

[chest ap (1 of 3)]
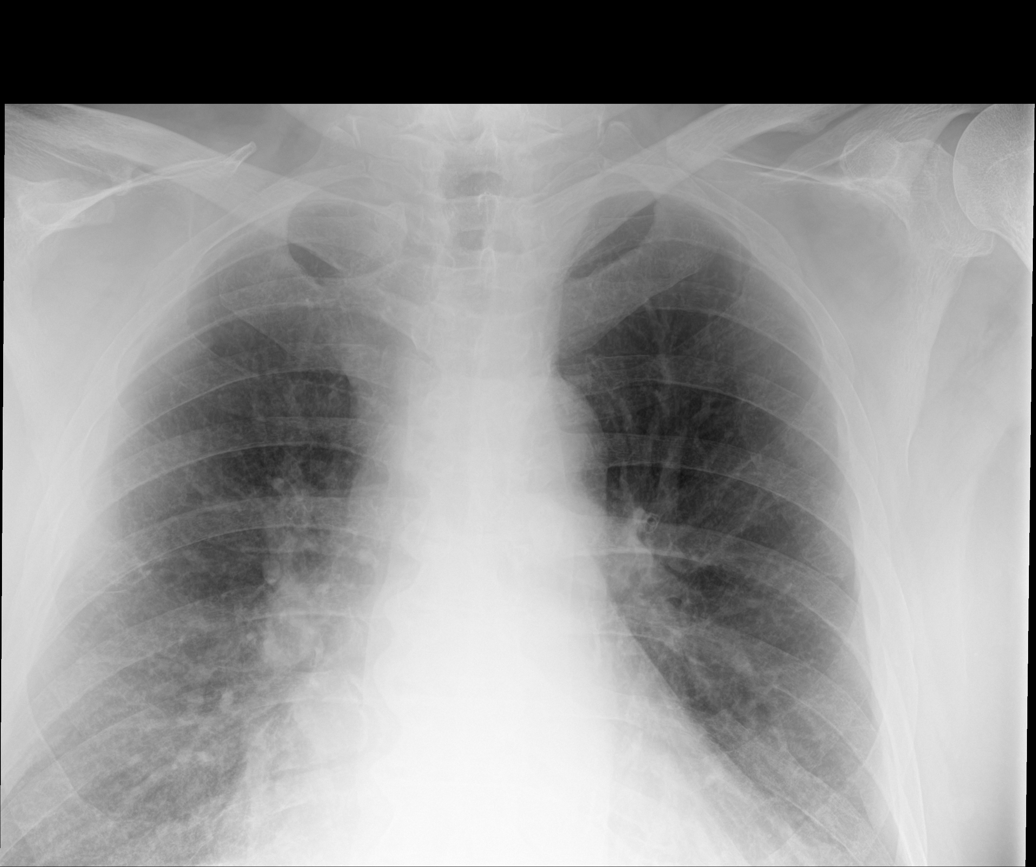

[chest ap (2 of 3)]
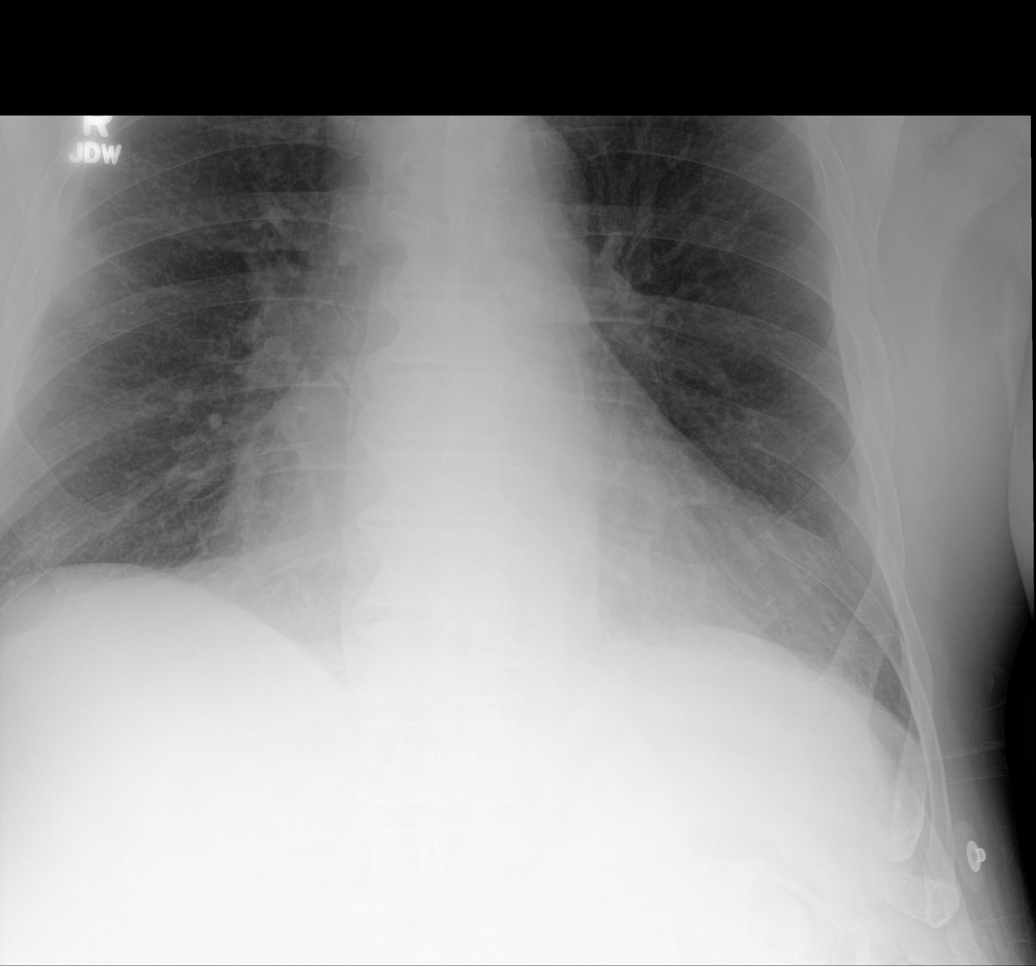

[chest ap (3 of 3)]
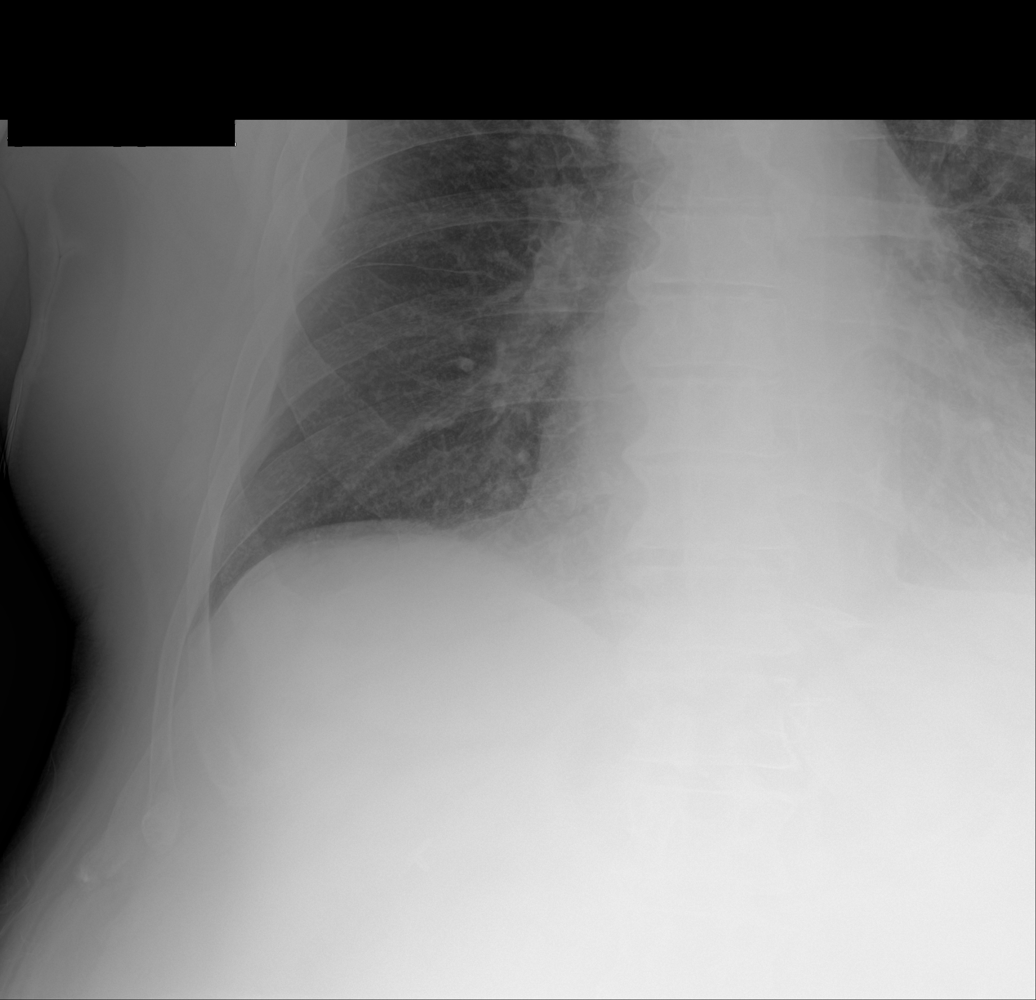

[4 of 4 positions shown; findings below may reference images not displayed]

FINDINGS: Stable cardiomediastinal silhouette with normal heart size. No
pneumothorax. No pleural effusion. Clear lungs, with no focal lung
consolidation and no pulmonary edema.
IMPRESSION: No active cardiopulmonary disease.

## 2016-11-09 IMAGING — CT CT HEAD W/O CM
2 series · 15 of 30 positions shown, 17 images · non-contrast
Comparison: 08/27/2014

CLINICAL DATA: Left-sided weakness for 2-3 days. History of
multiple strokes.

EXAM:
CT HEAD WITHOUT CONTRAST
TECHNIQUE: Contiguous axial images were obtained from the base of the skull
through the vertex without intravenous contrast.

[Series 2: head without · axial · non-contrast · 0.45mm/px · z∈[-130,-10]mm · 7 of 34 slices shown, 9 images]
[im 5/34  brain]
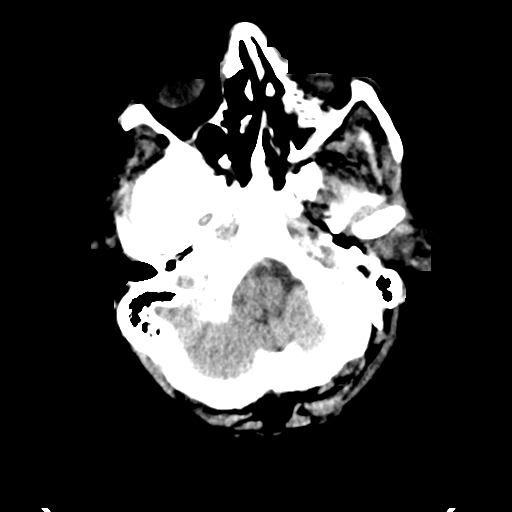
[im 5/34  bone]
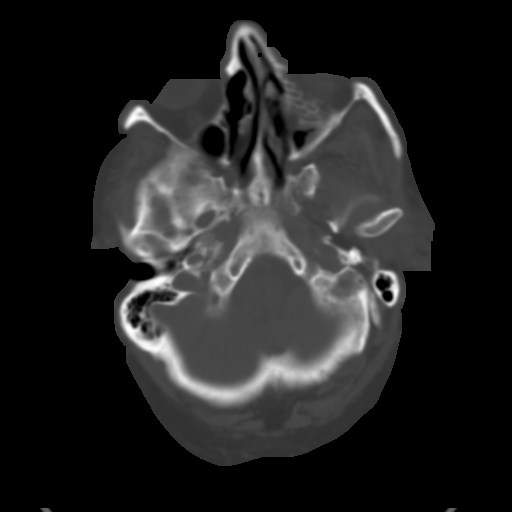
[im 9/34  brain]
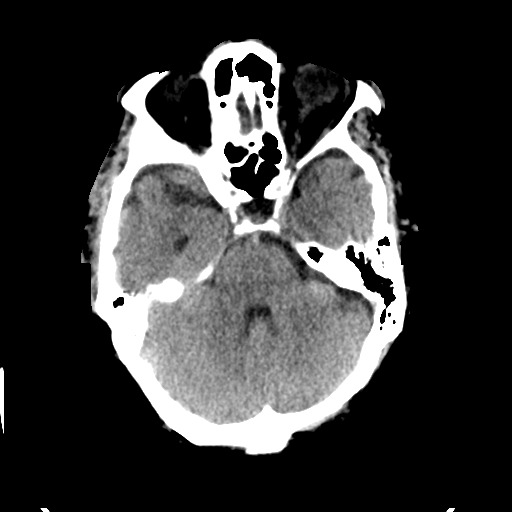
[im 13/34  brain]
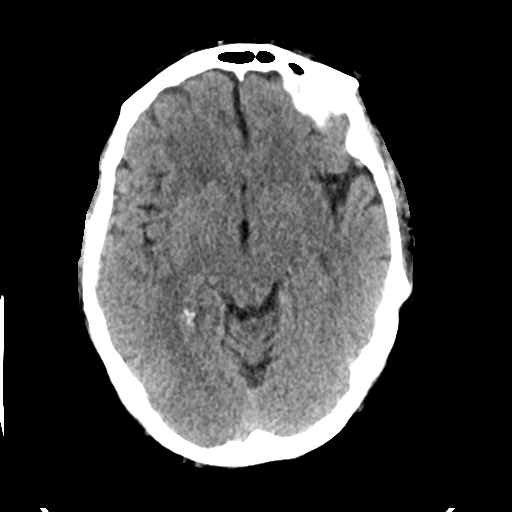
[im 17/34  brain]
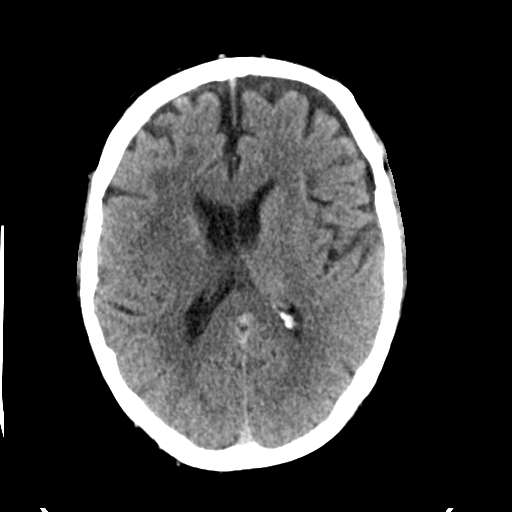
[im 21/34  brain]
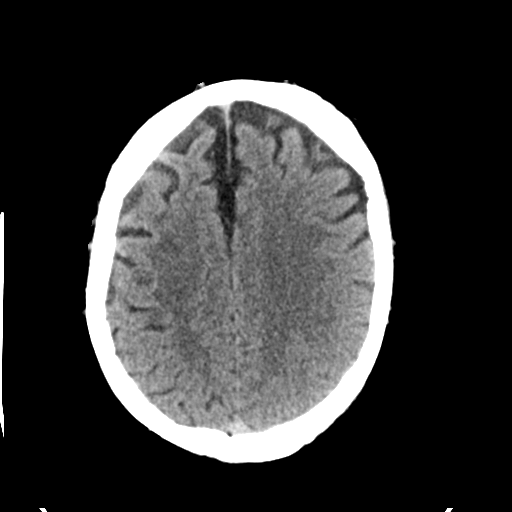
[im 21/34  bone]
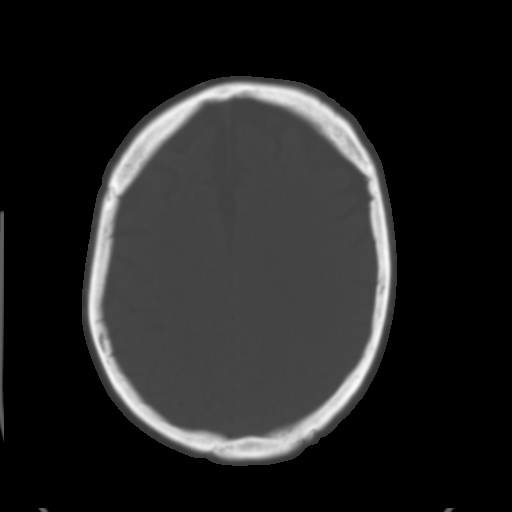
[im 25/34  brain]
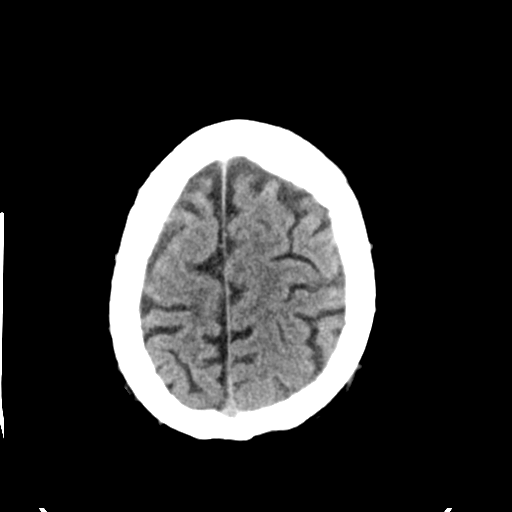
[im 29/34  brain]
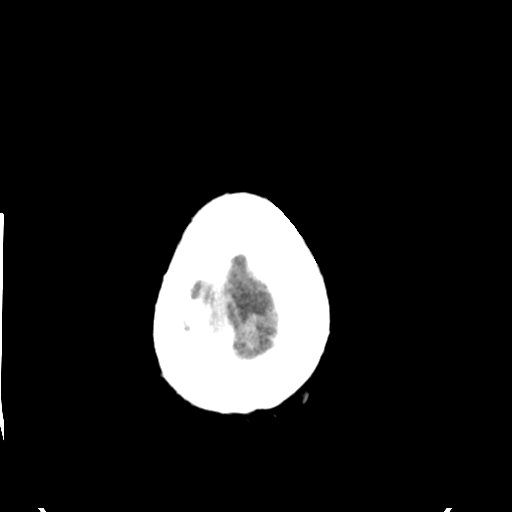

[Series 3: head bone · axial · 0.45mm/px · z∈[-134,-2]mm · 8 of 84 slices shown]
[im 9/84  bone]
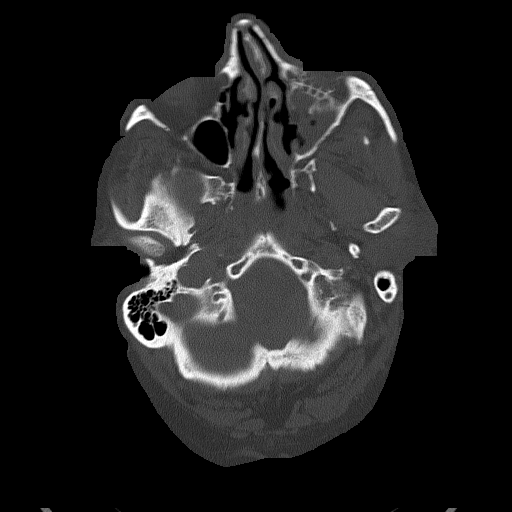
[im 17/84  bone]
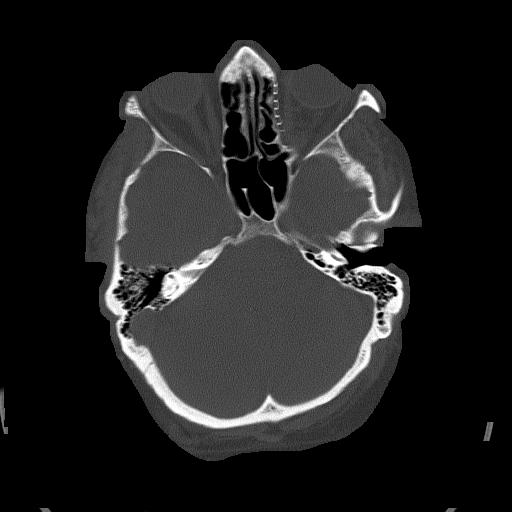
[im 25/84  bone]
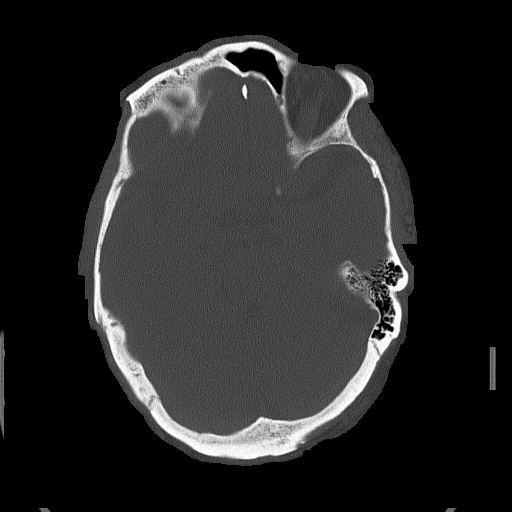
[im 38/84  bone]
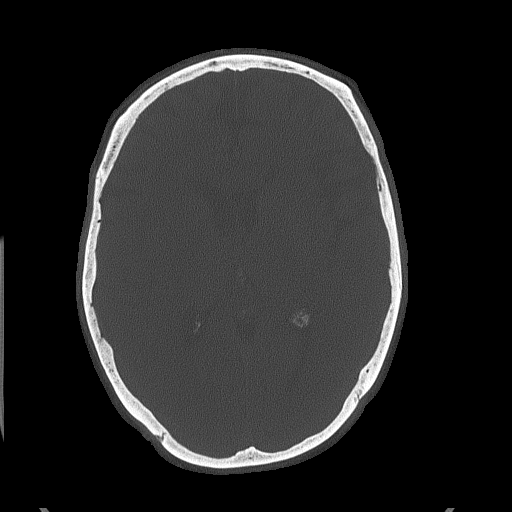
[im 46/84  bone]
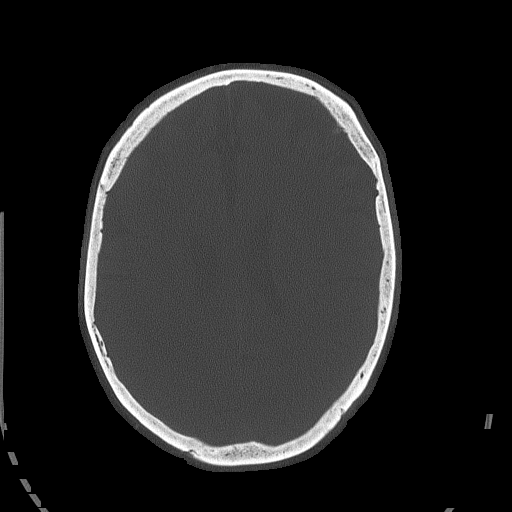
[im 59/84  bone]
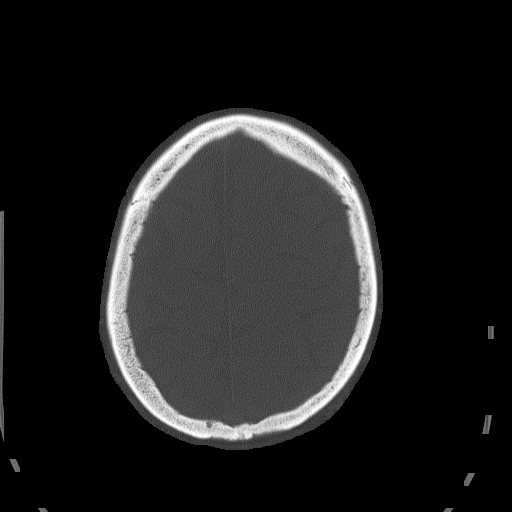
[im 67/84  bone]
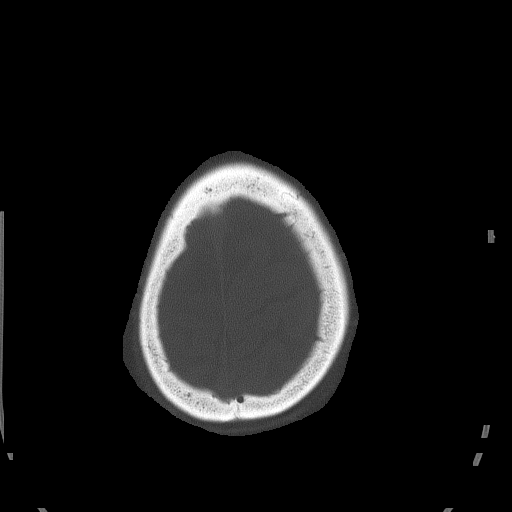
[im 75/84  bone]
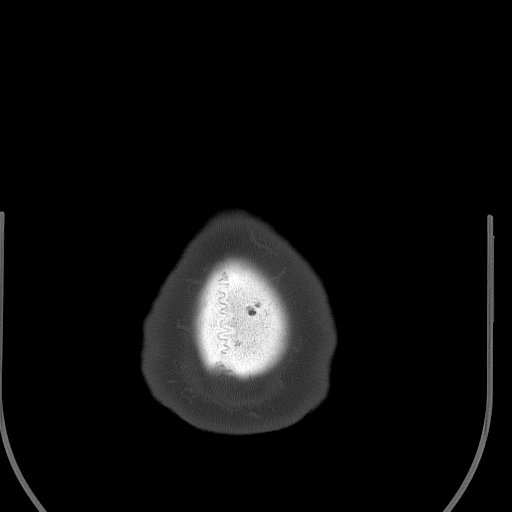

[15 of 30 positions shown; findings below may reference images not displayed]

FINDINGS: Mild chronic small vessel disease throughout the deep white matter.
No acute intracranial abnormality. Specifically, no hemorrhage,
hydrocephalus, mass lesion, acute infarction, or significant
intracranial injury. No acute calvarial abnormality.

Opacified left maxillary sinus is stable since prior study.
Postoperative changes in the left anterior maxillary wall and floor
the left orbit. Mastoid air cells are clear.
IMPRESSION: Chronic small vessel disease throughout the deep white matter. No
acute intracranial abnormality.

## 2016-11-11 IMAGING — XA IR VERTEBRAL  NON-SELECT UNILAT  RIGHT(MS)
7 of 20 series · 7 of 24 positions shown · IV contrast (IODINE)
Comparison: none

CLINICAL DATA: Worsening left-sided weakness, with dysarthria and
left facial droop. Patient with known severe pre-occlusive right
internal carotid artery intracranial stenoses. Patient is
symptomatic despite aggressive medical anti-platelet therapy.

[Series 1: carotid · 2 acquisitions, 1 frame shown (1 of 7)]
[im 1/2]
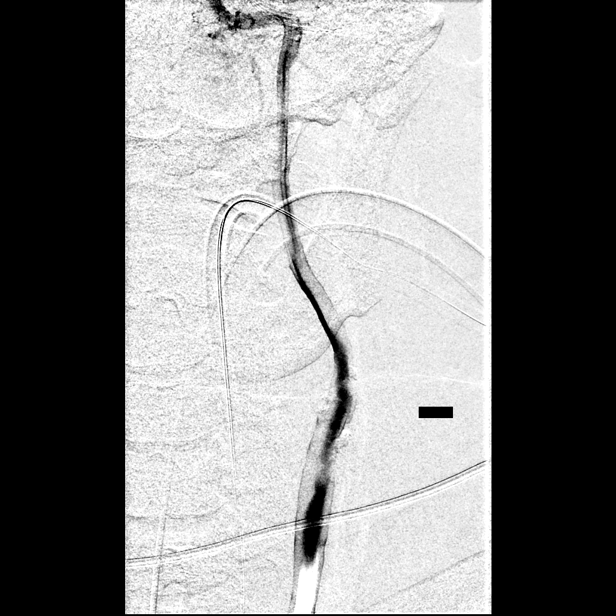

[Series 5: carotid · 2 acquisitions, 1 frame shown (2 of 7)]
[im 1/2]
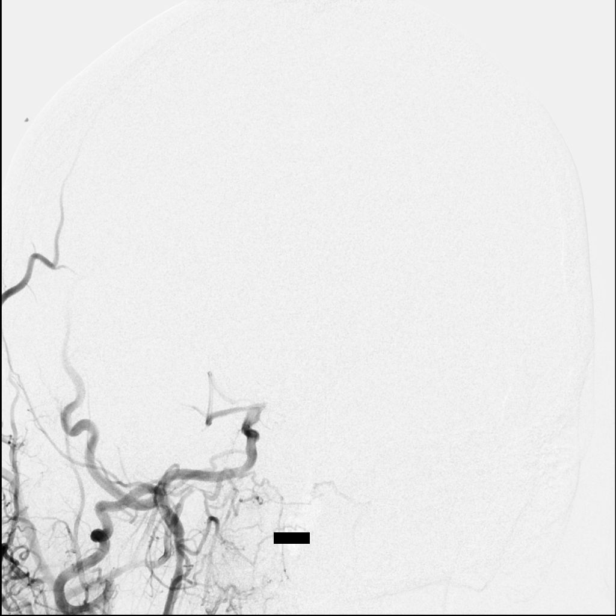

[Series 7: carotid · 2 acquisitions, 1 frame shown (3 of 7)]
[im 1/2]
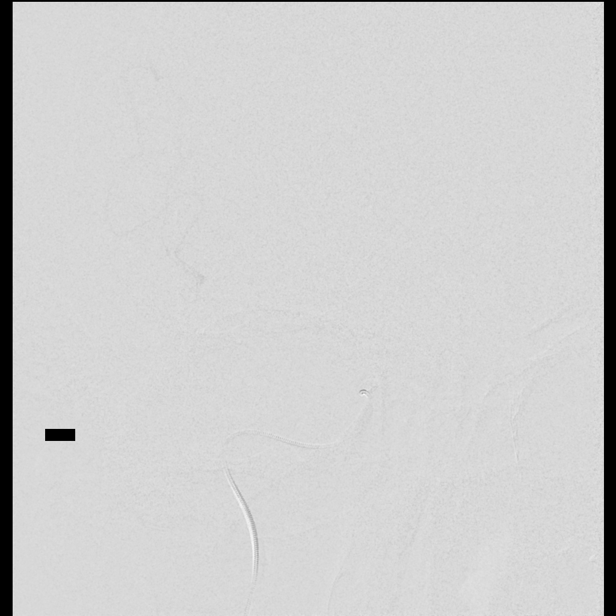

[Series 14: carotid · 2 acquisitions, 1 frame shown (4 of 7)]
[im 1/2]
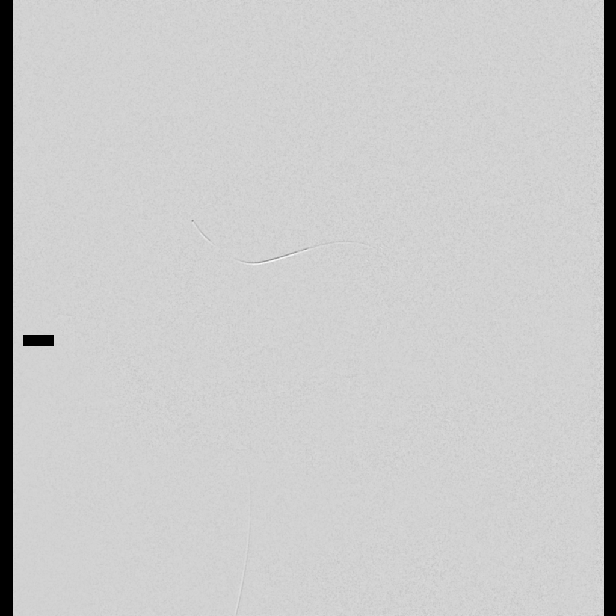

[Series 17: carotid · 2 acquisitions, 1 frame shown (5 of 7)]
[im 1/2]
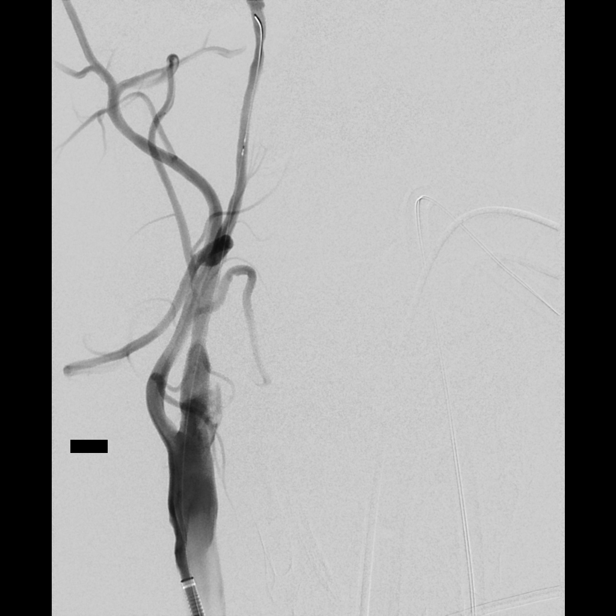

[Series 20: carotid · 2 acquisitions, 1 frame shown (6 of 7)]
[im 1/2]
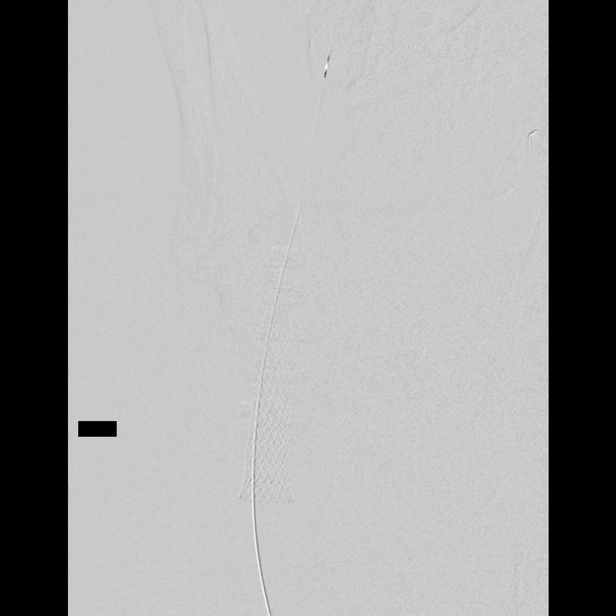

[Series 23: carotid · 2 acquisitions, 1 frame shown (7 of 7)]
[im 1/2]
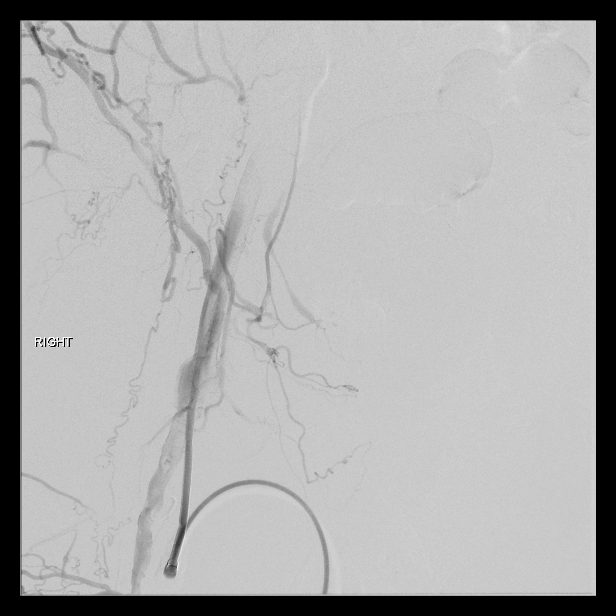

[7 of 24 positions shown; findings below may reference images not displayed]

EXAM:
IR ANGIO VERTEBRAL SEL SUBCLAVIAN INNOMINATE UNI RIGHT MOD SED;
INTRAVSC STENT CERV CAROTID W/O EMB-PROT; IR ENDOVASCULAR
INTRACRANIAL INFUSION OTHER THAN THROMBOLYSIS ARTERIAL INCLUDE DIAG
ANGIO; INTRACRANIAL STENT (INCL PTA); IR ANGIO INTRA EXTRACRAN SEL
COM CAROTID INNOMINATE UNI LEFT MOD SED BILATERAL COMMON CAROTID
ARTERIOGRAMS FOLLOWED BY ENDOVASCULAR COMPLETE REVASCULARIZATION OF
OCCLUDED RIGHT INTERNAL CAROTID ARTERY SUPRACLINOID SEGMENT, WITH
STENT-ASSISTED ANGIOPLASTY, FOLLOWED BY PLACEMENT OF A RIGHT
INTERNAL CAROTID ARTERY PROXIMAL STENT BECAUSE OF A SEVERE STENOSES
ASSOCIATED WITH LARGE ULCERATIVE PLAQUE. RIGHT VERTEBRAL ARTERY
ANGIOGRAM:

ANESTHESIA/SEDATION:
General anesthesia.

MEDICATIONS:
As per general anesthesia.

CONTRAST:  70 mL OMNIPAQUE IOHEXOL 300 MG/ML  SOLN

PROCEDURE:
Following a full explanation of the procedure along with the
potential associated complications, an informed witnessed consent
was obtained.

The patient was put under general anesthesia by the [REDACTED] at [HOSPITAL].

The right groin was prepped and draped in the usual sterile fashion.
Thereafter using modified Seldinger technique, transfemoral access
into the right common femoral artery was obtained using a
micropuncture set. Over a 0.035 inch guidewire, a 5 French Pinnacle
sheath was inserted. Through this, and also over a 0.035 inch
guidewire, a 5 French JB1 catheter was advanced to the aortic arch
region and selectively positioned in the left common carotid artery,
the right subclavian artery and the right common carotid artery.
Arteriograms were then performed intracranially and extracranially.
The patient tolerated the procedure well.

COMPLICATIONS:
None immediate.
FINDINGS: The left common carotid arteriogram demonstrates nonvisualization of
the left external carotid artery or its branches.

The left internal carotid artery at the bulb demonstrated a smooth
plaque with minimal ulceration.

No associated narrowing by the NASCET criteria was seen.

The vessel was otherwise seen to opacify to the cranial skull base.
The petrous segment appeared widely patent.

The cavernous segment proximally demonstrated moderate stenoses
associated with a small pseudoaneurysm.

The distal cavernous segment and the supraclinoid segments were seen
to be widely patent.

Left middle and the left anterior cerebral arteries opacified
normally into the capillary and the venous phases.

There was noted prompt opacification of the right anterior cerebral
artery A2 segment, the A1 segment, and also partially the right
middle cerebral artery distribution via the anterior communicating
artery from the left internal carotid artery injection.

The right subclavian arteriogram demonstrated the origin of the
right vertebral artery to be normal.

The vessel was seen to opacify normally to the cranial skull base to
opacify in the right vertebrobasilar junction, the proximal basilar
artery and the right posterior-inferior cerebellar artery.
Suggestion of retrograde partial opacification of the right internal
carotid artery supraclinoid segment from the right posterior
communicating artery was noted.

The right common carotid arteriogram demonstrates a complex
ulcerative plaque at the right internal carotid artery bulb and
distally associated with a significantly narrowed lumen of the right
internal carotid artery proximally just at the level of the bulb and
distally.

More distally, slow ascent of contrast was seen associated with
focal areas of narrowing involving the petrous-cavernous segment.

Complete angiographic occlusion of the right internal carotid artery
at the level of the ophthalmic artery was noted.

The delayed arterial phase demonstrated no evidence of distal
reconstitution.

ENDOVASCULAR COMPLETE REVASCULARIZATION OF THE OCCLUDED RIGHT
INTERNAL CAROTID ARTERY SUPRACLINOID SEGMENT WITH STENT ASSISTED
ANGIOPLASTY, AND THE RIGHT INTERNAL CAROTID ARTERY PROXIMALLY WITH
PLACEMENT OF A STENT:

The diagnostic JB1 catheter in the right common carotid artery was
then exchanged over a 0.035 inch 300 cm Rosen exchange guidewire for
a 6 French 85 cm Lebza Marley Kao Carzola using biplane roadmap technique
and constant fluoroscopic guidance. The Lebza Marley Kao Carzola had to
be positioned through an exchange 8 French Pinnacle sheath because
of previous scarring related to the surgery.

Good aspiration was obtained from the side port of the Tuohy-Borst
at the hub of the Lebza Marley Kao Carzola which was positioned just
proximal to the right common carotid bifurcation.

A gentle contrast injection demonstrated no evidence of spasms,
dissections or of intraluminal filling defects. At this time in a
coaxial manner and with constant heparinized saline infusion using
biplane roadmap technique and constant fluoroscopic guidance, rapid
transit two marker microcatheter was advanced over a 0.014 inch
Softip Synchro micro guidewire to the petrous portion of the right
internal carotid artery.

The micro guidewire was then removed. Through the microcatheter just
in the cavernous segment, approximately 6 mg of superselective
intra-arterial Integrelin was then infused over approximately three
minutes.

A control arteriogram performed through the microcatheter
demonstrated no change in the occluded right internal carotid artery
supraclinoid segment.

This time, using a torque device, a Synchro soft micro guidewire was
gently manipulated without difficulty through the occluded right
internal carotid artery supraclinoid segment and advanced into the
M2 region of the inferior division of the right middle cerebral
artery. This was then followed by the microcatheter. The micro
guidewire was removed. Good aspiration was obtained from the hub of
the microcatheter. The microcatheter in turn was then exchanged for
a 0.014 inch Softip 300 cm exchange Transend EX micro guidewire
using biplane roadmap technique and constant fluoroscopic guidance.
The microcatheter was retrieved and removed. The distal end of the
exchange micro guidewire which was not in the M2 region of the
superior division of the right middle cerebral artery had a J-tip
configuration to avoid dissections or inducing spasm, a control
arteriogram performed through the Lebza Marley Kao Carzola in the right
common carotid artery demonstrated now antegrade flow through the
previously occluded supraclinoid right ICA. Flow was noted into the
MCA branches.

A tight narrowing was now at the supraclinoid segment.

At this time, a 2.5 x 15 mm [REDACTED] angioplasty balloon
microcatheter which had been purged with 75% contrast and 25%
heparinized saline infusion was then advanced in a coaxial manner
and with constant heparinized saline infusion and positioned
optimally across the tight narrowing of the supraclinoid right ICA.
Once confirmed, this was then gradually expanded using a micro
inflation syringe device via micro tubing to approximately
atmospheres. Once there, it was maintained for approximately 1
minute 20 seconds.

The balloon was then deflated and removed using constant
fluoroscopic guidance with roadmap technique to ensure safe
positioning of the tip of the exchange micro guidewire in the M2
right MCA inferior branch.

A control arteriogram performed through the 6 Evangelista Reynaldo Bouet
Otai demonstrated significantly improved caliber and flow through
the angioplastied segment. Waste remained. This prompted the
placement of a Wingspan 4 x 20 mm stent device. Measurements had
been performed of the supraclinoid right ICA, proximally and
distally.

A 4 x 20 mm Wingspan stent system was prepped and purged with
heparinized saline infusion proximally through the Tuohy-Borst and
its delivery microcatheter hub. This was then locked into position.

There on after using biplane roadmap technique and constant
fluoroscopic guidance, in a coaxial manner and with constant
heparinized saline fusion, the Wingspan stent delivery microcatheter
and the stent delivery micro guidewire were then advanced without
difficulty and positioned such that the distal and the proximal
markers on the stent optimally placed across the site of the
angioplastied segment.

Once confirmed, the O rings on the delivery micro guidewire of the
stent and the delivery microcatheter were then loosened. With slight
forward gentle traction with the right hand on the stent stabilizing
device, which had been advanced to above the proximal portion of the
stent, with the left hand, the delivery microcatheter was then
gently retrieved unsheathing the distal and proximal portion of the
stent. Once delivered, the delivery system was retrieved and
removed. The distal micro guidewire was maintained in its position.

A control arteriogram performed through the 6 Evangelista Reynaldo Bouet
Otai in the right common carotid artery demonstrated excellent
flow through the stented supraclinoid right ICA with now
opacification of the right middle and the right anterior cerebral
artery.

No filling defects were seen.

Attention was now given to the right internal carotid artery
proximally at the site of the complex ulcerative plaque associated
with significant stenosis.

The exchange Transend 014 inch micro guidewire was then retrieved
such that the distal portion of the wire was now in the horizontal
petrous segment of the right internal carotid artery.

Measurements were then performed of the right internal carotid
artery and right common carotid artery across the bulb with the
ulcerative plaque.

It was decided to place a 8/6 mm x 40 mm Xact stent. This was then
prepped and purged with heparinized saline infusion retrogradely.

Using the rapid exchange system, the stent delivery system was then
advanced without difficulty using biplane roadmap technique and
constant fluoroscopic guidance in a coaxial manner and with constant
heparinized saline infusion and positioned such that distal and
proximal markers on the stent were optimally positioned.

Once confirmed, the O-ring on the 6 Quirijn Amazigh was
slightly loosened. Under constant observation, the stent was then
delivered without difficulty. With the distal wire held in safe
constant position, the delivery microcatheter of the stent was then
retrieved and removed. A control arteriogram was then performed
through the 6 Quirijn Amazigh which demonstrated
excellent apposition and caliber of the stented segment of the right
internal carotid artery.

Given the wide expansion with minimal to no waste it was decided
against post stent placement angioplasty.

A control arteriogram was then performed at [DATE] and 40 minutes
post placement of the stent proximally. These continue to
demonstrate excellent flow through the stented segment proximally
and also intracranially in the supraclinoid region. No evidence of
intraluminal filling defects or occlusions or spasm was noted.

The wire was then retrieved and removed under constant observation
ensuring no sudden movements of the stent proximally. None was
observed.

During the procedure the patient was given an additional 2 mg of
intra-arterial Integrelin through the guide catheter in the right
common carotid artery. Also patient was given a total of
approximately 6333 units of intravenous heparin. The ACT was
maintained in the region of approximately 180 seconds throughout the
procedure.

The patient exhibited no neurological or hemodynamic changes
throughout the procedure.

The 6 Quirijn Amazigh was then retrieved into the
abdominal aorta and removed. The 8 French Pinnacle groin sheath was
then retrieved and removed with the placement of an external closure
device.

The patient's general anesthesia was reversed and the patient was
extubated without difficulty. Upon recovery, the patient exhibited
no evidence of headaches, nausea, vomiting or any changes in his
left-sided weakness compared to prior to the procedure. He was then
transferred to the neuro ICU to continue on IV heparin, and to be
carefully monitored neurologically and especially his blood
pressures to be maintained within the parameters.

Toward the latter end of the day, the patient was fully awake,
alert, with no new neurological deficits. He continue to have
left-sided weakness unchanged although he felt it might be a little
bit better.

He also complained of sore throat, on account of his intubation.

The following morning, the patient's IV heparin was stopped as was
his IV Cardene. He was started on advancement of p.o. intake. His
right groin appeared soft. Patient's cervical collar was
discontinued. His p.o. intake was to be increased and patient was to
be mobilized as per the directions of the stroke team. He was
started on aspirin 325 mg a day, and Plavix 75 mg a day. He was
strongly advised to stop smoking and to return for follow-up in the
clinic once discharged. Patient was also asked to maintain adequate
hydration.
IMPRESSION: Status post endovascular complete revascularization of occluded
right internal carotid artery supraclinoid segment with stent
assisted angioplasty with placement of a Wingspan 4 x 20 mm stent,
and proximally of the right internal carotid artery symptomatic
stenoses with complex ulcerative plaque, by placement of a 8/6 mm x
40 mm Xact stent as described above.

## 2016-11-17 ENCOUNTER — Telehealth: Payer: Self-pay

## 2016-11-17 NOTE — Telephone Encounter (Signed)
Call received from Wann with [facility].  She wanted to confirm that that patient's appointment with Dr [name] on 11/25/16 can be the face to face visit for the power chair assessment. She stated that she will meet the patient at the clinic that day and bring the documents that need the provider signature.  Dr [name] notified that this visit will be the face to face visit for the power chair.

## 2016-11-25 ENCOUNTER — Ambulatory Visit: Payer: Self-pay | Admitting: Family Medicine

## 2016-11-25 ENCOUNTER — Telehealth: Payer: Self-pay

## 2016-11-25 NOTE — Telephone Encounter (Signed)
Call received from [name], the patient's aide. She stated that the patient was not able to make it to his appointment today because the aide that was supposed to be with him had a family emergency and could not work.  His appointment was re-scheduled for 12/25/16 @0930 .  [name] said that she would notify [facility] of the change as the patient was supposed to have a wheelchair evaluation today.  Andria Rhein, [facility] came to the office this morning. Notified her that the patient's appointment was re-scheduled to 12/25/16 @ 0930. Jackelyn Poling will provide the paperwork needed for the power chair evaluation.

## 2016-12-10 ENCOUNTER — Telehealth: Payer: Self-pay | Admitting: Family Medicine

## 2016-12-10 NOTE — Telephone Encounter (Signed)
I had left VM message back in August in response to call requesting nutrition consult.  No return call since then.  I called today, and spoke to caregiver [name], who said [name] is not interested in nutrition appt.

## 2016-12-25 ENCOUNTER — Telehealth: Payer: Self-pay

## 2016-12-25 ENCOUNTER — Ambulatory Visit: Payer: Medicare Other | Attending: Family Medicine | Admitting: Family Medicine

## 2016-12-25 ENCOUNTER — Encounter: Payer: Self-pay | Admitting: Family Medicine

## 2016-12-25 VITALS — BP 130/75 | HR 70 | Temp 97.4°F | Ht 72.0 in | Wt 233.8 lb

## 2016-12-25 DIAGNOSIS — Z79899 Other long term (current) drug therapy: Secondary | ICD-10-CM | POA: Insufficient documentation

## 2016-12-25 DIAGNOSIS — I110 Hypertensive heart disease with heart failure: Secondary | ICD-10-CM | POA: Insufficient documentation

## 2016-12-25 DIAGNOSIS — Z86718 Personal history of other venous thrombosis and embolism: Secondary | ICD-10-CM | POA: Insufficient documentation

## 2016-12-25 DIAGNOSIS — Z96659 Presence of unspecified artificial knee joint: Secondary | ICD-10-CM | POA: Diagnosis not present

## 2016-12-25 DIAGNOSIS — Z7901 Long term (current) use of anticoagulants: Secondary | ICD-10-CM | POA: Insufficient documentation

## 2016-12-25 DIAGNOSIS — E1149 Type 2 diabetes mellitus with other diabetic neurological complication: Secondary | ICD-10-CM | POA: Diagnosis not present

## 2016-12-25 DIAGNOSIS — Z9049 Acquired absence of other specified parts of digestive tract: Secondary | ICD-10-CM | POA: Insufficient documentation

## 2016-12-25 DIAGNOSIS — I509 Heart failure, unspecified: Secondary | ICD-10-CM | POA: Diagnosis not present

## 2016-12-25 DIAGNOSIS — I2510 Atherosclerotic heart disease of native coronary artery without angina pectoris: Secondary | ICD-10-CM | POA: Insufficient documentation

## 2016-12-25 DIAGNOSIS — E1152 Type 2 diabetes mellitus with diabetic peripheral angiopathy with gangrene: Secondary | ICD-10-CM

## 2016-12-25 DIAGNOSIS — M109 Gout, unspecified: Secondary | ICD-10-CM | POA: Diagnosis not present

## 2016-12-25 DIAGNOSIS — F0390 Unspecified dementia without behavioral disturbance: Secondary | ICD-10-CM | POA: Insufficient documentation

## 2016-12-25 DIAGNOSIS — I252 Old myocardial infarction: Secondary | ICD-10-CM | POA: Insufficient documentation

## 2016-12-25 DIAGNOSIS — I6521 Occlusion and stenosis of right carotid artery: Secondary | ICD-10-CM | POA: Insufficient documentation

## 2016-12-25 DIAGNOSIS — G4733 Obstructive sleep apnea (adult) (pediatric): Secondary | ICD-10-CM | POA: Diagnosis not present

## 2016-12-25 DIAGNOSIS — F419 Anxiety disorder, unspecified: Secondary | ICD-10-CM | POA: Insufficient documentation

## 2016-12-25 DIAGNOSIS — I69354 Hemiplegia and hemiparesis following cerebral infarction affecting left non-dominant side: Secondary | ICD-10-CM | POA: Diagnosis not present

## 2016-12-25 DIAGNOSIS — E785 Hyperlipidemia, unspecified: Secondary | ICD-10-CM | POA: Diagnosis not present

## 2016-12-25 DIAGNOSIS — K219 Gastro-esophageal reflux disease without esophagitis: Secondary | ICD-10-CM | POA: Insufficient documentation

## 2016-12-25 DIAGNOSIS — J449 Chronic obstructive pulmonary disease, unspecified: Secondary | ICD-10-CM | POA: Insufficient documentation

## 2016-12-25 DIAGNOSIS — F329 Major depressive disorder, single episode, unspecified: Secondary | ICD-10-CM | POA: Diagnosis not present

## 2016-12-25 DIAGNOSIS — I63231 Cerebral infarction due to unspecified occlusion or stenosis of right carotid arteries: Secondary | ICD-10-CM | POA: Diagnosis not present

## 2016-12-25 LAB — POCT GLYCOSYLATED HEMOGLOBIN (HGB A1C): Hemoglobin A1C: 6.2

## 2016-12-25 LAB — GLUCOSE, POCT (MANUAL RESULT ENTRY): POC Glucose: 128 mg/dl — AB (ref 70–99)

## 2016-12-25 MED ORDER — PREGABALIN 150 MG PO CAPS: 150.0000 mg | ORAL_CAPSULE | Freq: Two times a day (BID) | ORAL | 3 refills | Status: DC

## 2016-12-25 NOTE — Telephone Encounter (Addendum)
Call placed to Andria Rhein, [facility] to notify her that the patient is in the clinic for his appointment with Dr [name] and we are in need of the documentation requirements for the power chair.    Margurite Auerbach met patient and caregiver  in the clinic  and obtained provider  signatures on required documentation.  MD visit notes are pending.

## 2016-12-25 NOTE — Progress Notes (Signed)
Subjective:  Patient ID: Rangel Kenneth, male    DOB: 06/01/1950  Age: 66 y.o. MRN: [id number]  CC: Power Wheelchair evaluation  HPI [name] is a 66 year old male with a history of Diabetes mellitus (diet-controlled A1c of 6.2) HTN, CHF (EF 50-55% from 2-D echo of 07/2016), COPD, multiple previous CVAs with residual left hemiparesis, hyperlipidemia, gout, dementia,PVD here for a follow up visit accompanied by his PCS worker -[name].  He is in need of a power wheelchair due to his CVA with left hemiparesis; has been seen and evaluated by PT and I have reviewed and agree with plan. He has a need for tilt recline secondary to inability to pressure relief.  He complains of worsening neuropathy uncontrolled on current Lyrica dose and is requesting an increase in the dose. His diabetes is diet controlled and he denies hypoglycemia.  Past Medical History:  Diagnosis Date  . Alcohol abuse    H/O  . Anxiety   . Arthritis   . Asthma   . CAD (coronary artery disease) 05/27/10   Cath: severe single vessell CAD left cx midportion obtuse marginal 2 to 3.  [name] CHF (congestive heart failure) ([place])   . COPD (chronic obstructive pulmonary disease) ([place])   . COPD (chronic obstructive pulmonary disease) ([place])   . Depression   . Diabetes mellitus, type 2 ([place]) 03/13/2015  . GERD (gastroesophageal reflux disease)   . HCAP (healthcare-associated pneumonia) 10/15/2015  . History of DVT of lower extremity   . Hyperlipemia   . Hyperlipidemia   . Hypertension   . Myocardial infarction ([place]) 2000  . Orbital fracture ([place]) 12/2012  . OSA on CPAP   . Peripheral vascular disease, unspecified ([place])    08/20/10 doppler: increase in right ABI post-op. Left ABI stable. S/P bi-fem bypass surgery  . Pneumonia 04/04/2012  . Shortness of breath   . Stroke ([place])   . Tobacco abuse     Past Surgical History:  Procedure Laterality Date  . Abdominal Aortogram N/A 12/10/2014   Performed by Angelia Mould, MD at [place] [facility] [place] angio & bifem angio  05/27/10   Patent graft, occluded bil stents with no retrograde flow into the hypogastric arteries. 100% occl left ant. tibial artery, 70% to 80% to 100% stenosis right superficial fem artery above adductor canal. 100 % occl right ant. tibial vessell  . Aortogram w/ PTCA  09/292003  . BACK SURGERY    . CARDIAC CATHETERIZATION  05/27/10   severe CAD left cx  . CHOLECYSTECTOMY    . ESOPHAGOGASTRIC FUNDOPLICATION    . EYE SURGERY    . FEMORAL BYPASS  08/19/10   Right Fem-Pop  . IR ANGIO INTRA EXTRACRAN SEL COM CAROTID INNOMINATE BILAT MOD SED  07/20/2016  . IR ANGIO VERTEBRAL SEL SUBCLAVIAN INNOMINATE UNI R MOD SED  07/20/2016  . IR ANGIO VERTEBRAL SEL VERTEBRAL UNI L MOD SED  07/20/2016  . IR RADIOLOGIST EVAL & MGMT  05/27/2016  . JOINT REPLACEMENT    . RADIOLOGY WITH ANESTHESIA N/A 02/08/2015   Performed by Luanne Bras, MD at [place]  . TOTAL KNEE ARTHROPLASTY    . TRANSESOPHAGEAL ECHOCARDIOGRAM (TEE) N/A 08/29/2014   Performed by Josue Hector, MD at [facility] ENDOSCOPY    Allergies  Allergen Reactions  . Darvocet [Propoxyphene N-Acetaminophen] Hives  . Haldol [Haloperidol Decanoate] Hives  . Acetaminophen Nausea Only    Upset stomach, tolerates Hydrocodone/APAP if taken with food  .  Metformin And Related Nausea And Vomiting  . Norco [Hydrocodone-Acetaminophen] Nausea And Vomiting    Tolerates if taken with food      Outpatient Medications Prior to Visit  Medication Sig Dispense Refill  . ACCU-CHEK SOFTCLIX LANCETS lancets Use 3 times daily before meals 100 each 5  . albuterol (PROAIR HFA) 108 (90 Base) MCG/ACT inhaler Inhale 2 puffs into the lungs every 6 (six) hours as needed for wheezing or shortness of breath. 8.5 g 1  . albuterol (PROVENTIL) (2.5 MG/3ML) 0.083% nebulizer solution Take 3 mLs (2.5 mg total) by nebulization every 6 (six) hours as needed for wheezing or shortness of breath. 75 mL 3  .  allopurinol (ZYLOPRIM) 300 MG tablet Take 1 tablet (300 mg total) by mouth daily. 30 tablet 3  . atorvastatin (LIPITOR) 80 MG tablet Take 1 tablet (80 mg total) by mouth daily at 6 PM. 30 tablet 1  . Blood Glucose Monitoring Suppl (ACCU-CHEK AVIVA) device Use 3 times daily before meals. 1 each 0  . carvedilol (COREG) 25 MG tablet Take 0.5 tablets (12.5 mg total) by mouth 2 (two) times daily with a meal. 60 tablet 3  . clopidogrel (PLAVIX) 75 MG tablet Take 1 tablet (75 mg total) by mouth daily. 30 tablet 3  . FLUoxetine (PROZAC) 20 MG capsule TAKE ONE CAPSULE BY MOUTH ONCE DAILY (AM)  11  . glucose blood (ACCU-CHEK AVIVA) test strip Use 3 times daily before meals as directed. 100 each 12  . hydrALAZINE (APRESOLINE) 50 MG tablet Take 50 mg by mouth daily.   11  . lactulose (CHRONULAC) 10 GM/15ML solution Take 15 mLs (10 g total) by mouth 2 (two) times daily as needed for mild constipation. 946 mL 2  . Lancet Devices (ACCU-CHEK SOFTCLIX) lancets Use 3 times daily before meals 1 each 5  . memantine (NAMENDA) 10 MG tablet Take 1 tablet (10 mg total) by mouth 2 (two) times daily. 60 tablet 3  . Misc. Devices (ROLLATOR) MISC 1 each by Does not apply route daily. 1 each 0  . pantoprazole (PROTONIX) 40 MG tablet Take 1 tablet (40 mg total) by mouth every morning. 30 tablet 3  . polyethylene glycol (MIRALAX / GLYCOLAX) packet Take 17 g by mouth daily. 14 each 0  . tiZANidine (ZANAFLEX) 2 MG tablet Take 1 tablet (2 mg total) by mouth 3 (three) times daily. 90 tablet 3  . traZODone (DESYREL) 50 MG tablet Take 1 tablet (50 mg total) by mouth at bedtime as needed for sleep. 30 tablet 3  . triamcinolone cream (KENALOG) 0.1 % Apply 1 application topically daily as needed (for dermatitis). 30 g 0  . pregabalin (LYRICA) 100 MG capsule Take 1 capsule (100 mg total) by mouth 2 (two) times daily. 60 capsule 3  . gabapentin (NEURONTIN) 300 MG capsule TAKE 2 CAPSULES BY MOUTH 3 TIMES A DAY ( AM,NOON,NIGHT)  11  .  lisinopril (PRINIVIL,ZESTRIL) 5 MG tablet Take 1 tablet (5 mg total) by mouth daily. (Patient not taking: Reported on 12/25/2016) 30 tablet 5   No facility-administered medications prior to visit.     ROS Review of Systems  Constitutional: Negative for activity change and appetite change.  HENT: Negative for sinus pressure and sore throat.   Eyes: Negative for visual disturbance.  Respiratory: Negative for cough, chest tightness and shortness of breath.   Cardiovascular: Negative for chest pain and leg swelling.  Gastrointestinal: Negative for abdominal distention, abdominal pain, constipation and diarrhea.  Endocrine: Negative.  Genitourinary: Negative for dysuria.  Musculoskeletal: Negative for joint swelling and myalgias.  Skin: Negative for rash.  Allergic/Immunologic: Negative.   Neurological: Positive for weakness and numbness. Negative for light-headedness.  Psychiatric/Behavioral: Negative for dysphoric mood and suicidal ideas.    Objective:  BP 130/75   Pulse 70   Temp (!) 97.4 F (36.3 C) (Oral)   Ht 6' (1.829 m)   Wt 233 lb 12.8 oz (106.1 kg)   SpO2 97%   BMI 31.71 kg/m   BP/Weight 12/25/2016 10/19/2016 5/42/7062  Systolic BP 376 283 92  Diastolic BP 75 59 59  Wt. (Lbs) 233.8 229.6 226  BMI 31.71 31.14 30.65      Physical Exam  Constitutional: He is oriented to person, place, and time. He appears well-developed and well-nourished.  Cardiovascular: Normal rate and normal heart sounds.  No murmur heard. Unable to palpate b/l DP due to edema  Pulmonary/Chest: Effort normal and breath sounds normal. He has no wheezes. He has no rales. He exhibits no tenderness.  Abdominal: Soft. Bowel sounds are normal. He exhibits no distension and no mass. There is no tenderness.  Musculoskeletal: Normal range of motion. He exhibits edema (1+ bilaterall pitting pedal edema).  Neurological: He is alert and oriented to person, place, and time.  Motor strength: LUE -3/5;LLE-  3+/5 RUE - 4+/5; LLE - 4+/5  Skin: There is erythema (slight - of lower extremities).  Psychiatric: He has a normal mood and affect.     Lab Results  Component Value Date   HGBA1C 6.2 12/25/2016    Assessment & Plan:   1. Type 2 diabetes mellitus with diabetic peripheral angiopathy and gangrene, without long-term current use of insulin (HCC) Diet controlled with A1c of 6.2 Full diabetic visit at next office visit - POCT glucose (manual entry) - POCT glycosylated hemoglobin (Hb A1C)  2. Type 2 diabetes mellitus with other neurologic complication, without long-term current use of insulin (HCC) Uncontrolled Increase Lyrica dose - pregabalin (LYRICA) 150 MG capsule; Take 1 capsule (150 mg total) 2 (two) times daily by mouth.  Dispense: 60 capsule; Refill: 3  3. Cerebrovascular accident (CVA) due to stenosis of right carotid artery ([place]) With left hemiparesis Continue statin Risk factor modification - he continues to smoke which he has been counseled against but is non chalant about. Signed paperwork for power wheel chair  4. Hemiparesis affecting left side as late effect of cerebrovascular accident [facility]) Continue PT   Meds ordered this encounter  Medications  . pregabalin (LYRICA) 150 MG capsule    Sig: Take 1 capsule (150 mg total) 2 (two) times daily by mouth.    Dispense:  60 capsule    Refill:  3    Discontinue Gabapentin    Follow-up: Return in about 2 weeks (around 01/08/2017) for follow up of chronic medical conditions.   Arnoldo Morale MD

## 2016-12-25 NOTE — Patient Instructions (Signed)
Diabetic Neuropathy Diabetic neuropathy is a nerve disease or nerve damage that is caused by diabetes mellitus. About half of all people with diabetes mellitus have some form of nerve damage. Nerve damage is more common in those who have had diabetes mellitus for many years and who generally have not had good control of their blood sugar (glucose) level. Diabetic neuropathy is a common complication of diabetes mellitus. There are three common types of diabetic neuropathy and a fourth type that is less common and less understood:  Peripheral neuropathy-This is the most common type of diabetic neuropathy. It causes damage to the nerves of the feet and legs first and then eventually the hands and arms. The damage affects the ability to sense touch.  Autonomic neuropathy-This type causes damage to the autonomic nervous system, which controls the following functions: ? Heartbeat. ? Body temperature. ? Blood pressure. ? Urination. ? Digestion. ? Sweating. ? Sexual function.  Focal neuropathy-Focal neuropathy can be painful and unpredictable and occurs most often in older adults with diabetes mellitus. It involves a specific nerve or one area and often comes on suddenly. It usually does not cause long-term problems.  Radiculoplexus neuropathy- Sometimes called lumbosacral radiculoplexus neuropathy, radiculoplexus neuropathy affects the nerves of the thighs, hips, buttocks, or legs. It is more common in people with type 2 diabetes mellitus and in older men. It is characterized by debilitating pain, weakness, and atrophy, usually in the thigh muscles.  What are the causes? The cause of peripheral, autonomic, and focal neuropathies is diabetes mellitus that is uncontrolled and high glucose levels. The cause of radiculoplexus neuropathy is unknown. However, it is thought to be caused by inflammation related to uncontrolled glucose levels. What are the signs or symptoms? Peripheral Neuropathy Peripheral  neuropathy develops slowly over time. When the nerves of the feet and legs no longer work there may be:  Burning, stabbing, or aching pain in the legs or feet.  Inability to feel pressure or pain in your feet. This can lead to: ? Thick calluses over pressure areas. ? Pressure sores. ? Ulcers.  Foot deformities.  Reduced ability to feel temperature changes.  Muscle weakness.  Autonomic Neuropathy The symptoms of autonomic neuropathy vary depending on which nerves are affected. Symptoms may include:  Problems with digestion, such as: ? Feeling sick to your stomach (nausea). ? Vomiting. ? Bloating. ? Constipation. ? Diarrhea. ? Abdominal pain.  Difficulty with urination. This occurs if you lose your ability to sense when your bladder is full. Problems include: ? Urine leakage (incontinence). ? Inability to empty your bladder completely (retention).  Rapid or irregular heartbeat (palpitations).  Blood pressure drops when you stand up (orthostatic hypotension). When you stand up you may feel: ? Dizzy. ? Weak. ? Faint.  In men, inability to attain and maintain an erection.  In women, vaginal dryness and problems with decreased sexual desire and arousal.  Problems with body temperature regulation.  Increased or decreased sweating.  Focal Neuropathy  Abnormal eye movements or abnormal alignment of both eyes.  Weakness in the wrist.  Foot drop. This results in an inability to lift the foot properly and abnormal walking or foot movement.  Paralysis on one side of your face (Bell palsy).  Chest or abdominal pain. Radiculoplexus Neuropathy  Sudden, severe pain in your hip, thigh, or buttocks.  Weakness and wasting of thigh muscles.  Difficulty rising from a seated position.  Abdominal swelling.  Unexplained weight loss (usually more than 10 lb [4.5 kg]). How is   this diagnosed? Peripheral Neuropathy Your senses may be tested. Sensory function testing can be  done with:  A light touch using a monofilament.  A vibration with tuning fork.  A sharp sensation with a pin prick.  Other tests that can help diagnose neuropathy are:  Nerve conduction velocity. This test checks the transmission of an electrical current through a nerve.  Electromyography. This shows how muscles respond to electrical signals transmitted by nearby nerves.  Quantitative sensory testing. This is used to assess how your nerves respond to vibrations and changes in temperature.  Autonomic Neuropathy Diagnosis is often based on reported symptoms. Tell your health care provider if you experience:  Dizziness.  Constipation.  Diarrhea.  Inappropriate urination or inability to urinate.  Inability to get or maintain an erection.  Tests that may be done include:  Electrocardiography or Holter monitor. These are tests that can help show problems with the heart rate or heart rhythm.  An X-ray exam may be done.  Focal Neuropathy Diagnosis is made based on your symptoms and what your health care provider finds during your exam. Other tests may be done. They may include:  Nerve conduction velocities. This checks the transmission of electrical current through a nerve.  Electromyography. This shows how muscles respond to electrical signals transmitted by nearby nerves.  Quantitative sensory testing. This test is used to assess how your nerves respond to vibration and changes in temperature.  Radiculoplexus Neuropathy  Often the first thing is to eliminate any other issue or problems that might be the cause, as there is no standard test for diagnosis.  X-ray exam of your spine and lumbar region.  Spinal tap to rule out cancer.  MRI to rule out other lesions. How is this treated? Once nerve damage occurs, it cannot be reversed. The goal of treatment is to keep the disease or nerve damage from getting worse and affecting more nerve fibers. Controlling your blood  glucose level is the key. Most people with radiculoplexus neuropathy see at least a partial improvement over time. You will need to keep your blood glucose and HbA1c levels in the target range determined by your health care provider. Things that help control blood glucose levels include:  Blood glucose monitoring.  Meal planning.  Physical activity.  Diabetes medicine.  Over time, maintaining lower blood glucose levels helps lessen symptoms. Sometimes, prescription pain medicine is needed. Follow these instructions at home:  Do not smoke.  Keep your blood glucose level in the range that you and your health care provider have determined acceptable for you.  Keep your blood pressure level in the range that you and your health care provider have determined acceptable for you.  Eat a well-balanced diet.  Be physically active every day. Include strength training and balance exercises.  Protect your feet. ? Check your feet every day for sores, cuts, blisters, or signs of infection. ? Wear padded socks and supportive shoes. Use orthotic inserts, if necessary. ? Regularly check the insides of your shoes for worn spots. Make sure there are no rocks or other items inside your shoes before you put them on. Contact a health care provider if:  You have burning, stabbing, or aching pain in the legs or feet.  You are unable to feel pressure or pain in your feet.  You develop problems with digestion such as: ? Nausea. ? Vomiting. ? Bloating. ? Constipation. ? Diarrhea. ? Abdominal pain.  You have difficulty with urination, such as: ? Incontinence. ? Retention.    You have palpitations.  You develop orthostatic hypotension. When you stand up you may feel: ? Dizzy. ? Weak. ? Faint.  You cannot attain and maintain an erection (in men).  You have vaginal dryness and problems with decreased sexual desire and arousal (in women).  You have severe pain in your thighs, legs, or  buttocks.  You have unexplained weight loss. This information is not intended to replace advice given to you by your health care provider. Make sure you discuss any questions you have with your health care provider. Document Released: 04/06/2001 Document Revised: 07/04/2015 Document Reviewed: 07/07/2012 Elsevier Interactive Patient Education  2017 [employer]  

## 2016-12-25 NOTE — Progress Notes (Signed)
Power wheel chair evaluation. Pt would like to discuss dosage change for lyrica. Pt is becoming more forgetful can dosage be adjusted.

## 2016-12-28 ENCOUNTER — Telehealth: Payer: Self-pay

## 2016-12-28 NOTE — Telephone Encounter (Signed)
Call placed to Andria Rhein, [facility] and informed her that the visit note from Dr [name] is completed. She stated that she would stop by the office tomorrow to pick it up.

## 2016-12-29 ENCOUNTER — Telehealth: Payer: Self-pay

## 2016-12-29 ENCOUNTER — Telehealth: Payer: Self-pay | Admitting: Neurology

## 2016-12-29 NOTE — Telephone Encounter (Signed)
Rn call patients personal caretaker about home health. Rn stated Dr. Leonie Man will not return till 01/04/2017 at the hospital.Rn stated he will be contacted about doing orders for PT, and OT. [name] stated the home therapy ended the first of October 2018. [name] stated they can wait till Monday.

## 2016-12-29 NOTE — Telephone Encounter (Signed)
[name] who is patient's personal care aid is calling to get home health for PT and OT  stated in last OV with Dr. Leonie Man. He previously had from [facility].

## 2016-12-29 NOTE — Telephone Encounter (Signed)
[name], [facility], picked up the visit notes requested.

## 2017-01-04 ENCOUNTER — Other Ambulatory Visit: Payer: Self-pay

## 2017-01-04 DIAGNOSIS — I639 Cerebral infarction, unspecified: Secondary | ICD-10-CM

## 2017-01-04 NOTE — Telephone Encounter (Signed)
Referral in for home physical,and occupational therapy. PT wants [facility].

## 2017-01-05 ENCOUNTER — Emergency Department (HOSPITAL_COMMUNITY)
Admission: EM | Admit: 2017-01-05 | Discharge: 2017-01-06 | Disposition: A | Discharge status: Home / Self Care | Payer: Medicare Other | Attending: Emergency Medicine | Admitting: Emergency Medicine

## 2017-01-05 ENCOUNTER — Other Ambulatory Visit: Payer: Self-pay

## 2017-01-05 ENCOUNTER — Encounter (HOSPITAL_COMMUNITY): Payer: Self-pay | Admitting: Emergency Medicine

## 2017-01-05 ENCOUNTER — Emergency Department (HOSPITAL_COMMUNITY): Payer: Medicare Other

## 2017-01-05 DIAGNOSIS — R42 Dizziness and giddiness: Secondary | ICD-10-CM | POA: Insufficient documentation

## 2017-01-05 DIAGNOSIS — E119 Type 2 diabetes mellitus without complications: Secondary | ICD-10-CM | POA: Insufficient documentation

## 2017-01-05 DIAGNOSIS — I252 Old myocardial infarction: Secondary | ICD-10-CM | POA: Insufficient documentation

## 2017-01-05 DIAGNOSIS — J441 Chronic obstructive pulmonary disease with (acute) exacerbation: Secondary | ICD-10-CM | POA: Diagnosis not present

## 2017-01-05 DIAGNOSIS — I2510 Atherosclerotic heart disease of native coronary artery without angina pectoris: Secondary | ICD-10-CM | POA: Insufficient documentation

## 2017-01-05 DIAGNOSIS — R531 Weakness: Secondary | ICD-10-CM | POA: Insufficient documentation

## 2017-01-05 DIAGNOSIS — E860 Dehydration: Secondary | ICD-10-CM | POA: Insufficient documentation

## 2017-01-05 DIAGNOSIS — Z7902 Long term (current) use of antithrombotics/antiplatelets: Secondary | ICD-10-CM | POA: Diagnosis not present

## 2017-01-05 DIAGNOSIS — I10 Essential (primary) hypertension: Secondary | ICD-10-CM | POA: Insufficient documentation

## 2017-01-05 DIAGNOSIS — I5032 Chronic diastolic (congestive) heart failure: Secondary | ICD-10-CM | POA: Insufficient documentation

## 2017-01-05 DIAGNOSIS — R05 Cough: Secondary | ICD-10-CM | POA: Diagnosis present

## 2017-01-05 DIAGNOSIS — F17210 Nicotine dependence, cigarettes, uncomplicated: Secondary | ICD-10-CM | POA: Insufficient documentation

## 2017-01-05 DIAGNOSIS — I110 Hypertensive heart disease with heart failure: Secondary | ICD-10-CM | POA: Insufficient documentation

## 2017-01-05 DIAGNOSIS — Z79899 Other long term (current) drug therapy: Secondary | ICD-10-CM | POA: Insufficient documentation

## 2017-01-05 LAB — URINALYSIS, ROUTINE W REFLEX MICROSCOPIC
BILIRUBIN URINE: NEGATIVE
Glucose, UA: NEGATIVE mg/dL
Hgb urine dipstick: NEGATIVE
KETONES UR: NEGATIVE mg/dL
LEUKOCYTES UA: NEGATIVE
Nitrite: NEGATIVE
PH: 6 (ref 5.0–8.0)
Protein, ur: NEGATIVE mg/dL
SPECIFIC GRAVITY, URINE: 1.013 (ref 1.005–1.030)

## 2017-01-05 LAB — CBC WITH DIFFERENTIAL/PLATELET
BASOS PCT: 1 %
Basophils Absolute: 0 10*3/uL (ref 0.0–0.1)
EOS ABS: 0.4 10*3/uL (ref 0.0–0.7)
EOS PCT: 6 %
HCT: 40.7 % (ref 39.0–52.0)
Hemoglobin: 13 g/dL (ref 13.0–17.0)
LYMPHS ABS: 2.3 10*3/uL (ref 0.7–4.0)
Lymphocytes Relative: 33 %
MCH: 30.6 pg (ref 26.0–34.0)
MCHC: 31.9 g/dL (ref 30.0–36.0)
MCV: 95.8 fL (ref 78.0–100.0)
MONO ABS: 0.5 10*3/uL (ref 0.1–1.0)
MONOS PCT: 7 %
NEUTROS PCT: 53 %
Neutro Abs: 3.7 10*3/uL (ref 1.7–7.7)
Platelets: 175 10*3/uL (ref 150–400)
RBC: 4.25 MIL/uL (ref 4.22–5.81)
RDW: 14.8 % (ref 11.5–15.5)
WBC: 6.9 10*3/uL (ref 4.0–10.5)

## 2017-01-05 LAB — COMPREHENSIVE METABOLIC PANEL
ALBUMIN: 3.5 g/dL (ref 3.5–5.0)
ALK PHOS: 64 U/L (ref 38–126)
ALT: 12 U/L — ABNORMAL LOW (ref 17–63)
ANION GAP: 8 (ref 5–15)
AST: 30 U/L (ref 15–41)
BUN: 34 mg/dL — ABNORMAL HIGH (ref 6–20)
CALCIUM: 9.3 mg/dL (ref 8.9–10.3)
CO2: 21 mmol/L — AB (ref 22–32)
Chloride: 111 mmol/L (ref 101–111)
Creatinine, Ser: 1.18 mg/dL (ref 0.61–1.24)
GFR calc non Af Amer: >60 mL/min (ref >60)
GLUCOSE: 134 mg/dL — AB (ref 65–99)
POTASSIUM: 5.1 mmol/L (ref 3.5–5.1)
SODIUM: 140 mmol/L (ref 135–145)
TOTAL PROTEIN: 6 g/dL — AB (ref 6.5–8.1)
Total Bilirubin: 1.1 mg/dL (ref 0.3–1.2)

## 2017-01-05 LAB — I-STAT TROPONIN, ED: TROPONIN I, POC: 0.02 ng/mL (ref 0.00–0.08)

## 2017-01-05 LAB — I-STAT CG4 LACTIC ACID, ED: Lactic Acid, Venous: 1.93 mmol/L — ABNORMAL HIGH (ref 0.5–1.9)

## 2017-01-05 MED ORDER — SODIUM CHLORIDE 0.9 % IV BOLUS (SEPSIS)
1000.0000 mL | Freq: Once | INTRAVENOUS | Status: AC
  Administered 2017-01-05: 1000 mL via INTRAVENOUS

## 2017-01-05 MED ORDER — IPRATROPIUM-ALBUTEROL 0.5-2.5 (3) MG/3ML IN SOLN
3.0000 mL | Freq: Once | RESPIRATORY_TRACT | Status: AC
  Administered 2017-01-05: 3 mL via RESPIRATORY_TRACT
  Filled 2017-01-05: qty 3

## 2017-01-05 MED ORDER — SODIUM CHLORIDE 0.9 % IV BOLUS (SEPSIS)
500.0000 mL | Freq: Once | INTRAVENOUS | Status: AC
  Administered 2017-01-05: 500 mL via INTRAVENOUS

## 2017-01-05 MED ORDER — LEVOFLOXACIN 750 MG PO TABS: 750.0000 mg | ORAL_TABLET | Freq: Every day | ORAL | 0 refills | Status: AC

## 2017-01-05 MED ORDER — ALBUTEROL SULFATE HFA 108 (90 BASE) MCG/ACT IN AERS
2.0000 | INHALATION_SPRAY | Freq: Once | RESPIRATORY_TRACT | Status: AC
  Administered 2017-01-05: 2 via RESPIRATORY_TRACT
  Filled 2017-01-05: qty 6.7

## 2017-01-05 MED ORDER — LEVOFLOXACIN 750 MG PO TABS
750.0000 mg | ORAL_TABLET | Freq: Once | ORAL | Status: AC
  Administered 2017-01-05: 750 mg via ORAL
  Filled 2017-01-05: qty 1

## 2017-01-05 MED ORDER — PREDNISONE 20 MG PO TABS
60.0000 mg | ORAL_TABLET | Freq: Once | ORAL | Status: AC
  Administered 2017-01-05: 60 mg via ORAL
  Filled 2017-01-05: qty 3

## 2017-01-05 MED ORDER — PREDNISONE 20 MG PO TABS: 60.0000 mg | ORAL_TABLET | Freq: Every day | ORAL | 0 refills | Status: AC

## 2017-01-05 NOTE — ED Triage Notes (Signed)
Patient arrived to ED via [facility] from home. EMS reports:  [facility] called EMS. Reported patient was hypotensive (97/54), dizzy and worsened slurred speech since she last saw patient. Reported she had last seen patient yesterday. Patient reports he hasn't seen the [facility] aide since last Wednesday.  Patient has a hx of CVA, as well as slurred speech. Chain smokes.  Patient only drinks coffee.  EMS reports patient orthostatic - SBP sitting 92, SBP standing 70. 20 gauge in L hand.

## 2017-01-05 NOTE — ED Provider Notes (Signed)
[facility] EMERGENCY DEPARTMENT Provider Note   CSN: [id number] Arrival date & time: 01/05/17  1742     History   Chief Complaint Chief Complaint  Patient presents with  . Dizziness  . Hypotension    HPI [name] is a 66 y.o. male.  HPI   66 yo M with PMHx of CAD, CHF, COPD, here with generalized weakness. Pt reports that over the past several days, he's had a mild cough with wheezing and sputum production. He smokes and continues to smoke. He has not wanted to get up out of bed so he has been lying around not eating and drinking much. Over the past day, he's had generalized weakness with extreme dizziness on standing so EMS was called. No CP or leg swelling. On EMS arrival, pt was markedly orthostatic but o/w in NAD. No hypoxia. He feels mildly improved after 500 cc by EMS. NO other complaints. No recent sick contacts.  Past Medical History:  Diagnosis Date  . Alcohol abuse    H/O  . Anxiety   . Arthritis   . Asthma   . CAD (coronary artery disease) 05/27/10   Cath: severe single vessell CAD left cx midportion obtuse marginal 2 to 3.  [name] CHF (congestive heart failure) ([place])   . COPD (chronic obstructive pulmonary disease) ([place])   . COPD (chronic obstructive pulmonary disease) ([place])   . Depression   . Diabetes mellitus, type 2 ([place]) 03/13/2015  . GERD (gastroesophageal reflux disease)   . HCAP (healthcare-associated pneumonia) 10/15/2015  . History of DVT of lower extremity   . Hyperlipemia   . Hyperlipidemia   . Hypertension   . Myocardial infarction ([place]) 2000  . Orbital fracture ([place]) 12/2012  . OSA on CPAP   . Peripheral vascular disease, unspecified ([place])    08/20/10 doppler: increase in right ABI post-op. Left ABI stable. S/P bi-fem bypass surgery  . Pneumonia 04/04/2012  . Shortness of breath   . Stroke ([place])   . Tobacco abuse     Patient Active Problem List   Diagnosis Date Noted  . Cerebral thrombosis with cerebral infarction  07/18/2016  . Acute ischemic stroke ([place])   . Left hemiplegia ([place])   . Stroke-like symptoms 07/17/2016  . Insomnia 01/29/2016  . Depression 01/29/2016  . Alcohol abuse with intoxication ([place]) 10/16/2015  . Psoriasiform dermatitis 07/12/2015  . Dementia due to another medical condition 07/12/2015  . Urinary incontinence 03/14/2015  . Diabetes mellitus, type 2 ([place]) 03/13/2015  . Hemiparesis affecting left side as late effect of cerebrovascular accident ([place]) 02/18/2015  . Cerebrovascular accident (CVA) due to stenosis of right carotid artery ([place])   . Coronary artery disease involving native coronary artery of native heart without angina pectoris   . Tachypnea   . Thrombocytopenia ([place])   . History of stroke   . Stroke (cerebrum) ([place]) 02/06/2015  . Arterial ischemic stroke, MCA (middle cerebral artery), right, acute ([place])   . DOE (dyspnea on exertion) 11/23/2014  . PAF (paroxysmal atrial fibrillation) ([place]) 11/23/2014  . HLD (hyperlipidemia) 11/01/2014  . Tobacco use disorder 11/01/2014  . Diastolic CHF, acute on chronic (HCC) 11/01/2014  . Cerebral infarction due to stenosis of right carotid artery ([place]) 11/01/2014  . Carotid stenosis 11/01/2014  . Hyperkalemia 10/24/2014  . H/O total knee replacement 10/24/2014  . Essential hypertension   . Polysubstance abuse ([place]) 08/28/2014  . Cocaine abuse ([place])   . Intracranial carotid stenosis   . Headache around the  eyes 08/27/2014  . Acute CVA (cerebrovascular accident) ([place]) 08/27/2014  . CVA (cerebral vascular accident) ([place]) 08/26/2014  . History of DVT (deep vein thrombosis)   . Alcoholism ([place])   . Embolic stroke ([place])   . Cerebral infarction due to unspecified mechanism   . Hypotension   . AKI (acute kidney injury) ([place])   . Stroke ([place])   . CVA (cerebral infarction) 08/01/2014  . Alcohol dependence with withdrawal with complication ([place]) 74/25/9563  . DVT (deep venous thrombosis) ([place]) 02/22/2014  . Lower extremity edema  02/21/2014  . Chronic diastolic congestive heart failure ([place]) 11/21/2013  . CAD (coronary artery disease) 11/21/2013  . Alcohol abuse 11/21/2013  . GERD (gastroesophageal reflux disease) 08/10/2013  . Gout 08/10/2013  . Tobacco abuse 07/26/2013  . Trichiasis without entropion 04/16/2013  . Dizziness 04/10/2013  . Eyelid lesion 01/24/2013  . Enophthalmos due to trauma 01/19/2013  . Medial orbital wall fracture (HCC) 01/19/2013  . Closed blow-out fracture of floor of orbit ([place]) 01/19/2013  . Binocular vision disorder with diplopia 01/03/2013  . Fracture of inferior orbital wall ([place]) 01/03/2013  . Fracture of orbital floor ([place]) 01/03/2013  . Pain of left eye 01/03/2013  . Peripheral vascular disease ([place]) 07/27/2012  . COPD (chronic obstructive pulmonary disease) ([place]) 04/04/2012  . HTN (hypertension) 04/04/2012    Past Surgical History:  Procedure Laterality Date  . abdoninal ao angio & bifem angio  05/27/10   Patent graft, occluded bil stents with no retrograde flow into the hypogastric arteries. 100% occl left ant. tibial artery, 70% to 80% to 100% stenosis right superficial fem artery above adductor canal. 100 % occl right ant. tibial vessell  . Aortogram w/ PTCA  09/292003  . BACK SURGERY    . CARDIAC CATHETERIZATION  05/27/10   severe CAD left cx  . CHOLECYSTECTOMY    . ESOPHAGOGASTRIC FUNDOPLICATION    . EYE SURGERY    . FEMORAL BYPASS  08/19/10   Right Fem-Pop  . IR ANGIO INTRA EXTRACRAN SEL COM CAROTID INNOMINATE BILAT MOD SED  07/20/2016  . IR ANGIO VERTEBRAL SEL SUBCLAVIAN INNOMINATE UNI R MOD SED  07/20/2016  . IR ANGIO VERTEBRAL SEL VERTEBRAL UNI L MOD SED  07/20/2016  . IR RADIOLOGIST EVAL & MGMT  05/27/2016  . JOINT REPLACEMENT    . PERIPHERAL VASCULAR CATHETERIZATION N/A 12/10/2014   Procedure: Abdominal Aortogram;  Surgeon: Angelia Mould, MD;  Location: [facility];  Service: Cardiovascular;  Laterality: N/A;  . RADIOLOGY WITH ANESTHESIA N/A  02/08/2015   Procedure: RADIOLOGY WITH ANESTHESIA;  Surgeon: Luanne Bras, MD;  Location: [facility];  Service: Radiology;  Laterality: N/A;  . TEE WITHOUT CARDIOVERSION N/A 08/29/2014   Procedure: TRANSESOPHAGEAL ECHOCARDIOGRAM (TEE);  Surgeon: Josue Hector, MD;  Location: [facility] ENDOSCOPY;  Service: Cardiovascular;  Laterality: N/A;  . TOTAL KNEE ARTHROPLASTY         Home Medications    Prior to Admission medications   Medication Sig Start Date End Date Taking? Authorizing Provider  ACCU-CHEK SOFTCLIX LANCETS lancets Use 3 times daily before meals 09/17/15   Arnoldo Morale, MD  albuterol (PROAIR HFA) 108 (90 Base) MCG/ACT inhaler Inhale 2 puffs into the lungs every 6 (six) hours as needed for wheezing or shortness of breath. 10/15/16   Arnoldo Morale, MD  albuterol (PROVENTIL) (2.5 MG/3ML) 0.083% nebulizer solution Take 3 mLs (2.5 mg total) by nebulization every 6 (six) hours as needed for wheezing or shortness of breath. 04/28/16   Arnoldo Morale,  MD  allopurinol (ZYLOPRIM) 300 MG tablet Take 1 tablet (300 mg total) by mouth daily. 04/28/16   Arnoldo Morale, MD  atorvastatin (LIPITOR) 80 MG tablet Take 1 tablet (80 mg total) by mouth daily at 6 PM. 07/21/16   Diallo, Abdoulaye, MD  Blood Glucose Monitoring Suppl (ACCU-CHEK AVIVA) device Use 3 times daily before meals. 03/13/15   Arnoldo Morale, MD  carvedilol (COREG) 25 MG tablet Take 0.5 tablets (12.5 mg total) by mouth 2 (two) times daily with a meal. 06/01/16   Arnoldo Morale, MD  clopidogrel (PLAVIX) 75 MG tablet Take 1 tablet (75 mg total) by mouth daily. 04/28/16   Arnoldo Morale, MD  FLUoxetine (PROZAC) 20 MG capsule TAKE ONE CAPSULE BY MOUTH ONCE DAILY (AM) 10/02/16   [provider]  gabapentin (NEURONTIN) 300 MG capsule TAKE 2 CAPSULES BY MOUTH 3 TIMES A DAY ( AM,NOON,NIGHT) 09/04/16   [provider]  glucose blood (ACCU-CHEK AVIVA) test strip Use 3 times daily before meals as directed. 01/29/16   Arnoldo Morale, MD  hydrALAZINE  (APRESOLINE) 50 MG tablet Take 50 mg by mouth daily.  06/18/16   [provider]  lactulose (CHRONULAC) 10 GM/15ML solution Take 15 mLs (10 g total) by mouth 2 (two) times daily as needed for mild constipation. 04/28/16   Arnoldo Morale, MD  Lancet Devices [facility]) lancets Use 3 times daily before meals 09/27/15   Arnoldo Morale, MD  levofloxacin (LEVAQUIN) 750 MG tablet Take 1 tablet (750 mg total) by mouth daily for 5 days. 01/05/17 01/10/17  Duffy Bruce, MD  lisinopril (PRINIVIL,ZESTRIL) 5 MG tablet Take 1 tablet (5 mg total) by mouth daily. Patient not taking: Reported on 12/25/2016 09/22/16   Arnoldo Morale, MD  memantine (NAMENDA) 10 MG tablet Take 1 tablet (10 mg total) by mouth 2 (two) times daily. 04/28/16   Arnoldo Morale, MD  Misc. Devices (ROLLATOR) MISC 1 each by Does not apply route daily. 02/27/15   Arnoldo Morale, MD  pantoprazole (PROTONIX) 40 MG tablet Take 1 tablet (40 mg total) by mouth every morning. 04/28/16   Arnoldo Morale, MD  polyethylene glycol (MIRALAX / GLYCOLAX) packet Take 17 g by mouth daily. 07/22/16   Diallo, Earna Coder, MD  predniSONE (DELTASONE) 20 MG tablet Take 3 tablets (60 mg total) by mouth daily for 5 days. 01/05/17 01/10/17  Duffy Bruce, MD  pregabalin (LYRICA) 150 MG capsule Take 1 capsule (150 mg total) 2 (two) times daily by mouth. 12/25/16   Arnoldo Morale, MD  tiZANidine (ZANAFLEX) 2 MG tablet Take 1 tablet (2 mg total) by mouth 3 (three) times daily. 10/19/16   Garvin Fila, MD  traZODone (DESYREL) 50 MG tablet Take 1 tablet (50 mg total) by mouth at bedtime as needed for sleep. 04/28/16   Arnoldo Morale, MD  triamcinolone cream (KENALOG) 0.1 % Apply 1 application topically daily as needed (for dermatitis). 06/02/16   Arnoldo Morale, MD    Family History Family History  Problem Relation Age of Onset  . Heart attack Mother   . Heart failure Mother   . Cirrhosis Father   . Heart failure Brother   . Cancer Brother   . Hypertension Neg Hx         UNKNOWN  . Stroke Neg Hx        UNKNOWN    Social History Social History   Tobacco Use  . Smoking status: Current Every Day Smoker    Packs/day: 0.50    Years: 50.00  Pack years: 25.00    Types: Cigars, Cigarettes    Start date: 02/09/1961  . Smokeless tobacco: Former Systems developer    Types: Chew  . Tobacco comment: 2 ppd, full flavor  Substance Use Topics  . Alcohol use: No  . Drug use: No     Allergies   Darvocet [propoxyphene n-acetaminophen]; Haldol [haloperidol decanoate]; Acetaminophen; Metformin and related; and Norco [hydrocodone-acetaminophen]   Review of Systems Review of Systems  Constitutional: Positive for fatigue. Negative for chills and fever.  HENT: Negative for congestion and rhinorrhea.   Eyes: Negative for visual disturbance.  Respiratory: Positive for cough and wheezing. Negative for shortness of breath.   Cardiovascular: Negative for chest pain and leg swelling.  Gastrointestinal: Negative for abdominal pain, diarrhea, nausea and vomiting.  Genitourinary: Negative for dysuria and flank pain.  Musculoskeletal: Negative for neck pain and neck stiffness.  Skin: Negative for rash and wound.  Allergic/Immunologic: Negative for immunocompromised state.  Neurological: Positive for dizziness. Negative for syncope, weakness and headaches.  All other systems reviewed and are negative.    Physical Exam Updated Vital Signs BP (!) 132/59   Pulse 68   Temp 97.6 F (36.4 C) (Oral)   Resp 18   Ht 6' (1.829 m)   Wt 105.7 kg (233 lb)   SpO2 100%   BMI 31.60 kg/m   Physical Exam  Constitutional: He is oriented to person, place, and time. He appears well-developed and well-nourished. No distress.  HENT:  Head: Normocephalic and atraumatic.  Markedly dry MM  Eyes: Conjunctivae are normal.  Neck: Neck supple.  Cardiovascular: Normal rate, regular rhythm and normal heart sounds. Exam reveals no friction rub.  No murmur heard. Pulmonary/Chest: Effort  normal. No respiratory distress. He has wheezes (diffuse). He has no rales.  Abdominal: He exhibits no distension.  Musculoskeletal: He exhibits no edema.  Neurological: He is alert and oriented to person, place, and time. He exhibits normal muscle tone.  Skin: Skin is warm. Capillary refill takes less than 2 seconds.  Psychiatric: He has a normal mood and affect.  Nursing note and vitals reviewed.    ED Treatments / Results  Labs (all labs ordered are listed, but only abnormal results are displayed) Labs Reviewed  COMPREHENSIVE METABOLIC PANEL - Abnormal; Notable for the following components:      Result Value   CO2 21 (*)    Glucose, Bld 134 (*)    BUN 34 (*)    Total Protein 6.0 (*)    ALT 12 (*)    All other components within normal limits  I-STAT CG4 LACTIC ACID, ED - Abnormal; Notable for the following components:   Lactic Acid, Venous 1.93 (*)    All other components within normal limits  CBC WITH DIFFERENTIAL/PLATELET  URINALYSIS, ROUTINE W REFLEX MICROSCOPIC  I-STAT TROPONIN, ED  I-STAT CG4 LACTIC ACID, ED    EKG  EKG Interpretation  Date/Time:  Tuesday January 05 2017 18:40:05 EST Ventricular Rate:  67 PR Interval:    QRS Duration: 103 QT Interval:  418 QTC Calculation: 442 R Axis:   44 Text Interpretation:  Sinus rhythm Anteroseptal infarct, age indeterminate No significant change since last tracing Confirmed by Duffy Bruce [phone]) on 01/06/2017 1:26:09 AM       Radiology Dg Chest 2 View  Result Date: 01/05/2017 CLINICAL DATA:  Weakness and dizziness. EXAM: CHEST  2 VIEW COMPARISON:  07/17/2016 FINDINGS: Heart size remains enlarged. Lungs are clear without pulmonary edema or focal airspace disease. The trachea  is midline. No large pleural effusions. Patient was unable to raise the left arm due to history of stroke. Degenerative endplate changes in the thoracic spine. IMPRESSION: No acute chest abnormality. Stable cardiomegaly. Electronically Signed    By: Markus Daft M.D.   On: 01/05/2017 19:31    Procedures Procedures (including critical care time)  Medications Ordered in ED Medications  sodium chloride 0.9 % bolus 1,000 mL (0 mLs Intravenous Stopped 01/05/17 2145)  ipratropium-albuterol (DUONEB) 0.5-2.5 (3) MG/3ML nebulizer solution 3 mL (3 mLs Nebulization Given 01/05/17 2045)  sodium chloride 0.9 % bolus 500 mL (0 mLs Intravenous Stopped 01/05/17 2145)  predniSONE (DELTASONE) tablet 60 mg (60 mg Oral Given 01/05/17 2228)  ipratropium-albuterol (DUONEB) 0.5-2.5 (3) MG/3ML nebulizer solution 3 mL (3 mLs Nebulization Given 01/05/17 2229)  levofloxacin (LEVAQUIN) tablet 750 mg (750 mg Oral Given 01/05/17 2229)  albuterol (PROVENTIL HFA;VENTOLIN HFA) 108 (90 Base) MCG/ACT inhaler 2 puff (2 puffs Inhalation Given 01/05/17 2333)     Initial Impression / Assessment and Plan / ED Course  I have reviewed the triage vital signs and the nursing notes.  Pertinent labs & imaging results that were available during my care of the patient were reviewed by me and considered in my medical decision making (see chart for details).     66 yo M here with mild lightheadedness/dizziness upon standing in setting of several days of poor PO intake 2/2 coughing. Suspect acute COPD exacerbation with significant dehydration and subsequent orthostasis. Lab work is otherwise very reassuring. He has no signs of sepsis, with LA<2 and normal WBC. Trop neg, no signs of cardiac etiology. CXR is clear without PNA, PTX, or other abnormality. He has no focal neuro deficits and sx are only with standing, doubt CVA. He feels markedly improved with fluids and is no longer orthostatic here. Able to ambulate at his baseline. D/c with prednisone, levaquin given sputum production, and encouraged fluids.  Final Clinical Impressions(s) / ED Diagnoses   Final diagnoses:  Dehydration  COPD exacerbation [facility])    ED Discharge Orders        Ordered    predniSONE (DELTASONE) 20  MG tablet  Daily     01/05/17 2250    levofloxacin (LEVAQUIN) 750 MG tablet  Daily     01/05/17 2250       Duffy Bruce, MD 01/06/17 0126

## 2017-01-05 NOTE — ED Notes (Signed)
Pt refusing blood work, sts he wants it pulled from the IV. This RN explained to pt that due to IVF infusing, we would be unable to get blood from IV, pt still adamant about not getting stuck for any blood.

## 2017-01-05 NOTE — ED Notes (Signed)
Pt refused blood work, Primary school teacher

## 2017-01-05 NOTE — ED Notes (Signed)
Obtained orthostatic vital signs on pt, pt is one assist. Upon standing, pt stayed that he felt lightheaded. Pt is unsteady on feet. Pt was able to provide urine sample.

## 2017-01-19 ENCOUNTER — Ambulatory Visit: Payer: Self-pay | Admitting: Family Medicine

## 2017-01-25 ENCOUNTER — Telehealth: Payer: Self-pay

## 2017-01-25 NOTE — Telephone Encounter (Signed)
Updated document for power chair application faxed to [facility] - Attn: Jerrilyn Cairo.

## 2017-01-26 ENCOUNTER — Ambulatory Visit: Payer: Medicare Other | Attending: Family Medicine | Admitting: Family Medicine

## 2017-01-26 ENCOUNTER — Encounter: Payer: Self-pay | Admitting: Family Medicine

## 2017-01-26 VITALS — BP 90/57 | HR 68 | Temp 97.5°F | Ht 72.0 in | Wt 232.2 lb

## 2017-01-26 DIAGNOSIS — B86 Scabies: Secondary | ICD-10-CM

## 2017-01-26 DIAGNOSIS — Z7902 Long term (current) use of antithrombotics/antiplatelets: Secondary | ICD-10-CM | POA: Insufficient documentation

## 2017-01-26 DIAGNOSIS — I10 Essential (primary) hypertension: Secondary | ICD-10-CM

## 2017-01-26 DIAGNOSIS — Z96659 Presence of unspecified artificial knee joint: Secondary | ICD-10-CM | POA: Diagnosis not present

## 2017-01-26 DIAGNOSIS — E118 Type 2 diabetes mellitus with unspecified complications: Secondary | ICD-10-CM

## 2017-01-26 DIAGNOSIS — F17200 Nicotine dependence, unspecified, uncomplicated: Secondary | ICD-10-CM

## 2017-01-26 DIAGNOSIS — I110 Hypertensive heart disease with heart failure: Secondary | ICD-10-CM | POA: Insufficient documentation

## 2017-01-26 DIAGNOSIS — G4700 Insomnia, unspecified: Secondary | ICD-10-CM | POA: Diagnosis not present

## 2017-01-26 DIAGNOSIS — J449 Chronic obstructive pulmonary disease, unspecified: Secondary | ICD-10-CM

## 2017-01-26 DIAGNOSIS — M62838 Other muscle spasm: Secondary | ICD-10-CM | POA: Diagnosis not present

## 2017-01-26 DIAGNOSIS — E785 Hyperlipidemia, unspecified: Secondary | ICD-10-CM | POA: Insufficient documentation

## 2017-01-26 DIAGNOSIS — Z9049 Acquired absence of other specified parts of digestive tract: Secondary | ICD-10-CM | POA: Diagnosis not present

## 2017-01-26 DIAGNOSIS — I69354 Hemiplegia and hemiparesis following cerebral infarction affecting left non-dominant side: Secondary | ICD-10-CM | POA: Diagnosis not present

## 2017-01-26 DIAGNOSIS — I2510 Atherosclerotic heart disease of native coronary artery without angina pectoris: Secondary | ICD-10-CM | POA: Insufficient documentation

## 2017-01-26 DIAGNOSIS — F329 Major depressive disorder, single episode, unspecified: Secondary | ICD-10-CM | POA: Diagnosis not present

## 2017-01-26 DIAGNOSIS — G4733 Obstructive sleep apnea (adult) (pediatric): Secondary | ICD-10-CM | POA: Diagnosis not present

## 2017-01-26 DIAGNOSIS — M1000 Idiopathic gout, unspecified site: Secondary | ICD-10-CM | POA: Diagnosis not present

## 2017-01-26 DIAGNOSIS — Z720 Tobacco use: Secondary | ICD-10-CM | POA: Diagnosis not present

## 2017-01-26 DIAGNOSIS — K219 Gastro-esophageal reflux disease without esophagitis: Secondary | ICD-10-CM | POA: Diagnosis not present

## 2017-01-26 DIAGNOSIS — Z79899 Other long term (current) drug therapy: Secondary | ICD-10-CM | POA: Diagnosis not present

## 2017-01-26 DIAGNOSIS — I252 Old myocardial infarction: Secondary | ICD-10-CM | POA: Insufficient documentation

## 2017-01-26 DIAGNOSIS — E1151 Type 2 diabetes mellitus with diabetic peripheral angiopathy without gangrene: Secondary | ICD-10-CM | POA: Diagnosis present

## 2017-01-26 DIAGNOSIS — G4709 Other insomnia: Secondary | ICD-10-CM | POA: Diagnosis not present

## 2017-01-26 DIAGNOSIS — I509 Heart failure, unspecified: Secondary | ICD-10-CM | POA: Diagnosis not present

## 2017-01-26 DIAGNOSIS — B029 Zoster without complications: Secondary | ICD-10-CM

## 2017-01-26 DIAGNOSIS — E7800 Pure hypercholesterolemia, unspecified: Secondary | ICD-10-CM | POA: Diagnosis not present

## 2017-01-26 DIAGNOSIS — F0280 Dementia in other diseases classified elsewhere without behavioral disturbance: Secondary | ICD-10-CM | POA: Diagnosis not present

## 2017-01-26 LAB — GLUCOSE, POCT (MANUAL RESULT ENTRY): POC GLUCOSE: 126 mg/dL — AB (ref 70–99)

## 2017-01-26 MED ORDER — CARVEDILOL 12.5 MG PO TABS: 12.5000 mg | ORAL_TABLET | Freq: Two times a day (BID) | ORAL | 3 refills | Status: DC

## 2017-01-26 MED ORDER — ALLOPURINOL 300 MG PO TABS: 300.0000 mg | ORAL_TABLET | Freq: Every day | ORAL | 3 refills | Status: DC

## 2017-01-26 MED ORDER — ALBUTEROL SULFATE HFA 108 (90 BASE) MCG/ACT IN AERS: INHALATION_SPRAY | RESPIRATORY_TRACT | 1 refills | Status: DC

## 2017-01-26 MED ORDER — CLOPIDOGREL BISULFATE 75 MG PO TABS: 75.0000 mg | ORAL_TABLET | Freq: Every day | ORAL | 3 refills | Status: DC

## 2017-01-26 MED ORDER — MEMANTINE HCL 10 MG PO TABS: 10.0000 mg | ORAL_TABLET | Freq: Two times a day (BID) | ORAL | 3 refills | Status: DC

## 2017-01-26 MED ORDER — PERMETHRIN 5 % EX CREA: 1.0000 "application " | TOPICAL_CREAM | Freq: Once | CUTANEOUS | 1 refills | Status: AC

## 2017-01-26 MED ORDER — TIZANIDINE HCL 4 MG PO TABS: 4.0000 mg | ORAL_TABLET | Freq: Three times a day (TID) | ORAL | 3 refills | Status: DC

## 2017-01-26 MED ORDER — SELENIUM SULFIDE 2.25 % EX SHAM: 1.0000 "application " | MEDICATED_SHAMPOO | CUTANEOUS | 1 refills | Status: DC

## 2017-01-26 MED ORDER — ATORVASTATIN CALCIUM 80 MG PO TABS: 80.0000 mg | ORAL_TABLET | Freq: Every day | ORAL | 3 refills | Status: DC

## 2017-01-26 MED ORDER — TRAZODONE HCL 50 MG PO TABS: 50.0000 mg | ORAL_TABLET | Freq: Every evening | ORAL | 3 refills | Status: DC | PRN

## 2017-01-26 MED ORDER — PANTOPRAZOLE SODIUM 40 MG PO TBEC: 40.0000 mg | DELAYED_RELEASE_TABLET | ORAL | 3 refills | Status: DC

## 2017-01-26 MED ORDER — VALACYCLOVIR HCL 1 G PO TABS: 1000.0000 mg | ORAL_TABLET | Freq: Two times a day (BID) | ORAL | 0 refills | Status: DC

## 2017-01-26 NOTE — Progress Notes (Signed)
Pt is feeling very weak.

## 2017-01-26 NOTE — Progress Notes (Signed)
Subjective:  Patient ID: [name], male    DOB: 10-03-1950  Age: 66 y.o. MRN: [id number]  CC: Hypertension   HPI [name] is a 66 year old male with a history of Diabetes mellitus (diet-controlled A1c of 6.2) HTN, CHF (EF 50-55% from 2-D echo of 07/2016), COPD, multiple previous CVAs with residual left hemiparesis, hyperlipidemia, gout, dementia,PVD here for a follow up visit accompanied by his PCS worker -[name]  I Had increased his Lyrica dose to 150 mg twice daily at his last visit however he reports no improvement in his neuropathy. He also complains of weakness of his left arm and has to use his right upper extremity to assist his left arm in an attempt to move it.  This morning while attempting to put on his shirt he felt something "catch" in his left arm and also felt a knot. Currently does not undergo physical therapy.  He complains of a painful rash on his right forearm of unknown duration which also burns and itches.  He also has some pinpoint rash in his legs and lower back which itch more at night. Denies fever.  His COPD is stable with no acute exacerbations however he is requesting a nebulizer machine.  With regards to his diabetes he denies hypoglycemia and his diabetes is diet controlled.  Past Medical History:  Diagnosis Date  . Alcohol abuse    H/O  . Anxiety   . Arthritis   . Asthma   . CAD (coronary artery disease) 05/27/10   Cath: severe single vessell CAD left cx midportion obtuse marginal 2 to 3.  [name] CHF (congestive heart failure) ([place])   . COPD (chronic obstructive pulmonary disease) ([place])   . COPD (chronic obstructive pulmonary disease) ([place])   . Depression   . Diabetes mellitus, type 2 ([place]) 03/13/2015  . GERD (gastroesophageal reflux disease)   . HCAP (healthcare-associated pneumonia) 10/15/2015  . History of DVT of lower extremity   . Hyperlipemia   . Hyperlipidemia   . Hypertension   . Myocardial infarction ([place]) 2000  . Orbital  fracture ([place]) 12/2012  . OSA on CPAP   . Peripheral vascular disease, unspecified ([place])    08/20/10 doppler: increase in right ABI post-op. Left ABI stable. S/P bi-fem bypass surgery  . Pneumonia 04/04/2012  . Shortness of breath   . Stroke ([place])   . Tobacco abuse     Past Surgical History:  Procedure Laterality Date  . abdoninal ao angio & bifem angio  05/27/10   Patent graft, occluded bil stents with no retrograde flow into the hypogastric arteries. 100% occl left ant. tibial artery, 70% to 80% to 100% stenosis right superficial fem artery above adductor canal. 100 % occl right ant. tibial vessell  . Aortogram w/ PTCA  09/292003  . BACK SURGERY    . CARDIAC CATHETERIZATION  05/27/10   severe CAD left cx  . CHOLECYSTECTOMY    . ESOPHAGOGASTRIC FUNDOPLICATION    . EYE SURGERY    . FEMORAL BYPASS  08/19/10   Right Fem-Pop  . IR ANGIO INTRA EXTRACRAN SEL COM CAROTID INNOMINATE BILAT MOD SED  07/20/2016  . IR ANGIO VERTEBRAL SEL SUBCLAVIAN INNOMINATE UNI R MOD SED  07/20/2016  . IR ANGIO VERTEBRAL SEL VERTEBRAL UNI L MOD SED  07/20/2016  . IR RADIOLOGIST EVAL & MGMT  05/27/2016  . JOINT REPLACEMENT    . PERIPHERAL VASCULAR CATHETERIZATION N/A 12/10/2014   Procedure: Abdominal Aortogram;  Surgeon: Angelia Mould, MD;  Location: [place] [facility];  Service: Cardiovascular;  Laterality: N/A;  . RADIOLOGY WITH ANESTHESIA N/A 02/08/2015   Procedure: RADIOLOGY WITH ANESTHESIA;  Surgeon: Luanne Bras, MD;  Location: [facility];  Service: Radiology;  Laterality: N/A;  . TEE WITHOUT CARDIOVERSION N/A 08/29/2014   Procedure: TRANSESOPHAGEAL ECHOCARDIOGRAM (TEE);  Surgeon: Josue Hector, MD;  Location[facility];  Service: Cardiovascular;  Laterality: N/A;  . TOTAL KNEE ARTHROPLASTY      Allergies  Allergen Reactions  . Darvocet [Propoxyphene N-Acetaminophen] Hives  . Haldol [Haloperidol Decanoate] Hives  . Acetaminophen Nausea Only    Upset stomach, tolerates Hydrocodone/APAP if  taken with food  . Metformin And Related Nausea And Vomiting  . Norco [Hydrocodone-Acetaminophen] Nausea And Vomiting    Tolerates if taken with food     Outpatient Medications Prior to Visit  Medication Sig Dispense Refill  . ACCU-CHEK SOFTCLIX LANCETS lancets Use 3 times daily before meals 100 each 5  . Blood Glucose Monitoring Suppl (ACCU-CHEK AVIVA) device Use 3 times daily before meals. 1 each 0  . FLUoxetine (PROZAC) 20 MG capsule TAKE ONE CAPSULE BY MOUTH ONCE DAILY (AM)  11  . glucose blood (ACCU-CHEK AVIVA) test strip Use 3 times daily before meals as directed. 100 each 12  . lactulose (CHRONULAC) 10 GM/15ML solution Take 15 mLs (10 g total) by mouth 2 (two) times daily as needed for mild constipation. 946 mL 2  . Lancet Devices (ACCU-CHEK SOFTCLIX) lancets Use 3 times daily before meals 1 each 5  . Misc. Devices (ROLLATOR) MISC 1 each by Does not apply route daily. 1 each 0  . polyethylene glycol (MIRALAX / GLYCOLAX) packet Take 17 g by mouth daily. 14 each 0  . pregabalin (LYRICA) 150 MG capsule Take 1 capsule (150 mg total) 2 (two) times daily by mouth. 60 capsule 3  . triamcinolone cream (KENALOG) 0.1 % Apply 1 application topically daily as needed (for dermatitis). 30 g 0  . albuterol (PROAIR HFA) 108 (90 Base) MCG/ACT inhaler Inhale 2 puffs into the lungs every 6 (six) hours as needed for wheezing or shortness of breath. 8.5 g 1  . allopurinol (ZYLOPRIM) 300 MG tablet Take 1 tablet (300 mg total) by mouth daily. 30 tablet 3  . atorvastatin (LIPITOR) 80 MG tablet Take 1 tablet (80 mg total) by mouth daily at 6 PM. 30 tablet 1  . carvedilol (COREG) 25 MG tablet Take 0.5 tablets (12.5 mg total) by mouth 2 (two) times daily with a meal. 60 tablet 3  . clopidogrel (PLAVIX) 75 MG tablet Take 1 tablet (75 mg total) by mouth daily. 30 tablet 3  . memantine (NAMENDA) 10 MG tablet Take 1 tablet (10 mg total) by mouth 2 (two) times daily. 60 tablet 3  . pantoprazole (PROTONIX) 40 MG  tablet Take 1 tablet (40 mg total) by mouth every morning. 30 tablet 3  . tiZANidine (ZANAFLEX) 2 MG tablet Take 1 tablet (2 mg total) by mouth 3 (three) times daily. 90 tablet 3  . traZODone (DESYREL) 50 MG tablet Take 1 tablet (50 mg total) by mouth at bedtime as needed for sleep. 30 tablet 3  . albuterol (PROVENTIL) (2.5 MG/3ML) 0.083% nebulizer solution Take 3 mLs (2.5 mg total) by nebulization every 6 (six) hours as needed for wheezing or shortness of breath. (Patient not taking: Reported on 01/26/2017) 75 mL 3  . hydrALAZINE (APRESOLINE) 50 MG tablet Take 50 mg by mouth daily.   11  . lisinopril (PRINIVIL,ZESTRIL) 5 MG  tablet Take 1 tablet (5 mg total) by mouth daily. (Patient not taking: Reported on 12/25/2016) 30 tablet 5  . gabapentin (NEURONTIN) 300 MG capsule TAKE 2 CAPSULES BY MOUTH 3 TIMES A DAY ( AM,NOON,NIGHT)  11   No facility-administered medications prior to visit.     ROS Review of Systems  Constitutional: Negative for activity change and appetite change.  HENT: Negative for sinus pressure and sore throat.   Eyes: Negative for visual disturbance.  Respiratory: Negative for cough, chest tightness and shortness of breath.   Cardiovascular: Negative for chest pain and leg swelling.  Gastrointestinal: Negative for abdominal distention, abdominal pain, constipation and diarrhea.  Endocrine: Negative.   Genitourinary: Negative for dysuria.  Musculoskeletal:       See hpi  Skin: Positive for rash.  Allergic/Immunologic: Negative.   Neurological: Positive for weakness and numbness. Negative for light-headedness.  Psychiatric/Behavioral: Negative for dysphoric mood and suicidal ideas.    Objective:  BP (!) 90/57   Pulse 68   Temp (!) 97.5 F (36.4 C) (Oral)   Ht 6' (1.829 m)   Wt 232 lb 3.2 oz (105.3 kg)   SpO2 96%   BMI 31.49 kg/m   BP/Weight 01/26/2017 01/05/2017 27/07/2374  Systolic BP 90 283 151  Diastolic BP 57 59 75  Wt. (Lbs) 232.2 233 233.8  BMI 31.49  31.6 31.71      Physical Exam  Constitutional: He is oriented to person, place, and time. He appears well-developed and well-nourished.  Cardiovascular: Normal rate and normal heart sounds.  No murmur heard. Unable to palpate dorsalis pedis bilaterally due to pedal edema  Pulmonary/Chest: Effort normal and breath sounds normal. He has no wheezes. He has no rales. He exhibits no tenderness.  Abdominal: Soft. Bowel sounds are normal. He exhibits no distension and no mass. There is no tenderness.  Musculoskeletal: Normal range of motion. He exhibits edema (1+ b/l non pitting pedal edema).  Neurological: He is alert and oriented to person, place, and time.  Strength: RUE, RLE - 4/5 LUE- 3/5, LLE - 3+/5  Skin:  Pinpoint rash in bilateral lower extremities and low back. Erythematous rash localized on dorsal aspect of right forearm     Lab Results  Component Value Date   HGBA1C 6.2 12/25/2016    Assessment & Plan:   1. Type 2 diabetes mellitus with complication, without long-term current use of insulin (HCC) Diet controlled with A1c of 6.2 - POCT glucose (manual entry)  2. Chronic obstructive pulmonary disease, unspecified COPD type ([place]) Stable No acute exacerbation Provided with nebulizer machine from the clinic - albuterol (PROAIR HFA) 108 (90 Base) MCG/ACT inhaler; Inhale 2 puffs into the lungs every 6 (six) hours as needed for wheezing or shortness of breath.  Dispense: 8.5 g; Refill: 1  3. Idiopathic gout, unspecified chronicity, unspecified site No recent flares - allopurinol (ZYLOPRIM) 300 MG tablet; Take 1 tablet (300 mg total) by mouth daily.  Dispense: 30 tablet; Refill: 3  4. Essential hypertension Controlled Low-sodium diet - carvedilol (COREG) 12.5 MG tablet; Take 1 tablet (12.5 mg total) by mouth 2 (two) times daily with a meal.  Dispense: 60 tablet; Refill: 3  5. Hemiparesis affecting left side as late effect of cerebrovascular accident [facility]) He does have  spastic hemiparesis and might benefit from Botox shots Has an upcoming appointment with orthopedics where he receives left shoulder injection and will hold off until been and if symptoms persist will refer to rehab - clopidogrel (PLAVIX) 75 MG tablet;  Take 1 tablet (75 mg total) by mouth daily.  Dispense: 30 tablet; Refill: 3  6. Dementia due to another medical condition, without behavioral disturbance Stable - memantine (NAMENDA) 10 MG tablet; Take 1 tablet (10 mg total) by mouth 2 (two) times daily.  Dispense: 60 tablet; Refill: 3  7. Other insomnia Controlled - traZODone (DESYREL) 50 MG tablet; Take 1 tablet (50 mg total) by mouth at bedtime as needed for sleep.  Dispense: 30 tablet; Refill: 3  8. Muscle spasm Uncontrolled with recent exacerbation Again this is likely related to his spastic left hemiparesis We will check with case management regarding physical therapy as referral was placed twice and he is yet to commence this Increased dose of Zanaflex - tiZANidine (ZANAFLEX) 4 MG tablet; Take 1 tablet (4 mg total) by mouth 3 (three) times daily.  Dispense: 90 tablet; Refill: 3  9. Scabies Placed on permethrin Discussed care of beddings  10. Herpes zoster without complication - valACYclovir (VALTREX) 1000 MG tablet; Take 1 tablet (1,000 mg total) by mouth 2 (two) times daily.  Dispense: 20 tablet; Refill: 0  11. Pure hypercholesterolemia Stable - atorvastatin (LIPITOR) 80 MG tablet; Take 1 tablet (80 mg total) by mouth daily at 6 PM.  Dispense: 30 tablet; Refill: 3  12. Tobacco use disorder Spent 4 minutes discussing hazardous effect of smoking and consequences and he is not yet ready to quit.   Meds ordered this encounter  Medications  . albuterol (PROAIR HFA) 108 (90 Base) MCG/ACT inhaler    Sig: Inhale 2 puffs into the lungs every 6 (six) hours as needed for wheezing or shortness of breath.    Dispense:  8.5 g    Refill:  1  . allopurinol (ZYLOPRIM) 300 MG tablet     Sig: Take 1 tablet (300 mg total) by mouth daily.    Dispense:  30 tablet    Refill:  3  . atorvastatin (LIPITOR) 80 MG tablet    Sig: Take 1 tablet (80 mg total) by mouth daily at 6 PM.    Dispense:  30 tablet    Refill:  3  . carvedilol (COREG) 12.5 MG tablet    Sig: Take 1 tablet (12.5 mg total) by mouth 2 (two) times daily with a meal.    Dispense:  60 tablet    Refill:  3  . clopidogrel (PLAVIX) 75 MG tablet    Sig: Take 1 tablet (75 mg total) by mouth daily.    Dispense:  30 tablet    Refill:  3  . memantine (NAMENDA) 10 MG tablet    Sig: Take 1 tablet (10 mg total) by mouth 2 (two) times daily.    Dispense:  60 tablet    Refill:  3  . pantoprazole (PROTONIX) 40 MG tablet    Sig: Take 1 tablet (40 mg total) by mouth every morning.    Dispense:  30 tablet    Refill:  3  . traZODone (DESYREL) 50 MG tablet    Sig: Take 1 tablet (50 mg total) by mouth at bedtime as needed for sleep.    Dispense:  30 tablet    Refill:  3  . tiZANidine (ZANAFLEX) 4 MG tablet    Sig: Take 1 tablet (4 mg total) by mouth 3 (three) times daily.    Dispense:  90 tablet    Refill:  3  . Selenium Sulfide 2.25 % SHAM    Sig: Apply 1 application topically 2 (two) times a week.  Dispense:  180 mL    Refill:  1  . valACYclovir (VALTREX) 1000 MG tablet    Sig: Take 1 tablet (1,000 mg total) by mouth 2 (two) times daily.    Dispense:  20 tablet    Refill:  0  . permethrin (ELIMITE) 5 % cream    Sig: Apply 1 application topically once for 1 dose. Leave in for 8-14 hours then was off.    Dispense:  60 g    Refill:  1    Follow-up: Return in about 3 months (around 04/26/2017) for follow up of chronic medical conditions.   Arnoldo Morale MD

## 2017-01-27 ENCOUNTER — Telehealth: Payer: Self-pay

## 2017-01-27 ENCOUNTER — Other Ambulatory Visit: Payer: Self-pay | Admitting: Pharmacist

## 2017-01-27 ENCOUNTER — Telehealth: Payer: Self-pay | Admitting: Family Medicine

## 2017-01-27 DIAGNOSIS — J449 Chronic obstructive pulmonary disease, unspecified: Secondary | ICD-10-CM

## 2017-01-27 MED ORDER — ALBUTEROL SULFATE (2.5 MG/3ML) 0.083% IN NEBU: 2.5000 mg | INHALATION_SOLUTION | Freq: Four times a day (QID) | RESPIRATORY_TRACT | 3 refills | Status: DC | PRN

## 2017-01-27 NOTE — Telephone Encounter (Signed)
Pt's care provider called to say that she did not receive the solution to the machine that was prescribed to the patient. Please follow up

## 2017-01-27 NOTE — Telephone Encounter (Signed)
Message sent to Maren Reamer, [facility] Referral specialist, inquiring about the status of the home health referral for PT.

## 2017-01-27 NOTE — Telephone Encounter (Signed)
Medication was sent over to pharmacy today by provider.

## 2017-01-29 ENCOUNTER — Telehealth: Payer: Self-pay

## 2017-01-29 DIAGNOSIS — I6389 Other cerebral infarction: Secondary | ICD-10-CM

## 2017-01-29 NOTE — Telephone Encounter (Signed)
As per Maren Reamer, Referral Specialist [facility], the referral for home health care was placed by [facility].  Call placed to [facility] # [phone], spoke to [place] who stated that they have not received a referral for the patient. The last communication they have regarding the patient is 10/16/2016.

## 2017-02-04 ENCOUNTER — Telehealth (INDEPENDENT_AMBULATORY_CARE_PROVIDER_SITE_OTHER): Payer: Self-pay | Admitting: Orthopaedic Surgery

## 2017-02-04 NOTE — Telephone Encounter (Signed)
noted 

## 2017-02-04 NOTE — Telephone Encounter (Signed)
Patient called requesting cortisone injection in left shoulder. Had previously had them at another office but ours is closer. Just letting you know CB # [phone]

## 2017-02-04 NOTE — Addendum Note (Signed)
Addended by: Arnoldo Morale on: 02/04/2017 08:36 AM   Modules accepted: Orders

## 2017-02-04 NOTE — Telephone Encounter (Signed)
Referral has been placed. 

## 2017-02-08 ENCOUNTER — Other Ambulatory Visit: Payer: Self-pay | Admitting: Family Medicine

## 2017-02-10 ENCOUNTER — Telehealth: Payer: Self-pay | Admitting: Family Medicine

## 2017-02-10 NOTE — Telephone Encounter (Signed)
Call placed to patient [phone] regarding home health. Spoke with patient and asked him if he had an agency of choice for home health and patient stated [facility]. Informed patient that referral will be placed and in a few days [facility] will contact him.

## 2017-02-11 ENCOUNTER — Telehealth: Payer: Self-pay | Admitting: Family Medicine

## 2017-02-11 NOTE — Telephone Encounter (Signed)
Patient's referral, demographics and office notes were faxed yesterday 02/11/16 to [place].  Received an email from [employer] stating:   Good afternoon Venetia Night.  We received your referral for this patient however at this time, we'll be unable to accept him. We are still trying to get caught up in the [employer] area from the holiday hospital and facility discharges so we do not currently have staffing available for new referrals. We hope to be caught up in the next week or two.   I did notice that it looks like this pt may have been referred to [facility] last month so he may still be open with them.   Thank you for thinking of [facility] and we hope to help you again very soon!   [facility]

## 2017-02-11 NOTE — Telephone Encounter (Signed)
Call placed to patient regarding home health. I wanted to inform patient that [facility] is not taking new referrals at the moment and if he wanted to wait or choose another agency. No answer. Left message for patient to return my call at [phone].

## 2017-02-12 ENCOUNTER — Telehealth: Payer: Self-pay | Admitting: Family Medicine

## 2017-02-12 NOTE — Telephone Encounter (Signed)
Call placed to patient [phone] to give him an update on his home health referral. No answer. Left patient a message asking him to return my call.   Call placed to other number [phone] and spoke with Lyn, caretaker. Lyn informed me that an agency [facility], had already reached out to patient regarding home health and they will be going next week. Notified Lyn that she may be referring to [facility] services rather than home health. During our conversation Lyn stated that 60 hours a month was not enough for patient. Patient needs someone's company/care for more than 2-3 hours day and especially at night time. Patient has fallen several times and has ended up in the hospital. Patient also leave his home and is doing "bad things"".  Lyn is very concerned and would like to speak with provider to reevaluate patient's care. Lyn strongly believes that patient needs to be in skilled nursing facility.   At the end of our conversation, Lyn stated that [facility] reached out to patient and will be coming next week to see him. Lyn strongly believes that patient needs more than PT. Notified Lyn that I would inform provider.

## 2017-02-12 NOTE — Telephone Encounter (Signed)
Call placed to patient at [phone] regarding his home health referral and how [facility] cannot take patient due to limited staffing. Spoke with Lyn, Psychologist, forensic and she informed me that an [employer] fundraiser, had already reached out to patient regarding home health and they will be going next week. Notified Lyn that she may be referring to [facility] services rather than home health.   During our conversation Lyn stated that 60 hours a month was not enough for patient. Patient needs someone's company/care for more than 2-3 hours day and especially at night time. Patient has fallen several times and has ended up in the hospital. Patient also leaves his home and is doing "bad things".  Lyn is very concerned and would like to speak with provider to reevaluate patient's care. Lyn strongly believes that patient needs to be in skilled nursing facility. Informed Lyn that I would notify provider.

## 2017-02-12 NOTE — Telephone Encounter (Signed)
Call placed to [facility] care [phone] regarding patient's home health referral. Spoke with Debarah Crape and she did confirm that patient's referral was declined due to staffing.

## 2017-02-15 NOTE — Telephone Encounter (Signed)
Call placed to the patient's caregiver, [name] # [phone] to discuss options for assistance with care at home. She stated that she was at the dentist and would need to call this CM back.  She has the # for [facility]

## 2017-02-15 NOTE — Telephone Encounter (Signed)
Could you please speak with his caregiver regarding options available re: PCS services vs CAP program vs assisted living including the pros, cons and barriers. I am happy to proceed with whatever he qualifies for or his insurance pays for.Thanks.

## 2017-02-16 ENCOUNTER — Telehealth: Payer: Self-pay

## 2017-02-16 NOTE — Telephone Encounter (Signed)
Call received from [name], patient's caregiver with [facility].  She stated that she has accepted a new position at her company and will no longer be providing care for the patient. He has been referred to [facility] # [phone] for his PCS services. She explained that the patient is requiring more care as time goes on.  She stated that after she is gone, he is " helpless" and she has been doing " everything" for him and stated that his " memory is worse."  She also explained that his medications come in a bubble pack and he can even become confused managing the bubble pack system. She went on to state that after she leaves he " buys and smokes drugs."  She said he has fallen multiple times and been to the ED recently.  This CM inquired where he has gone to the ED and she said [facility].  As per Epic he was last in the ED on 01/05/17 for dehydration  and prior to that on 08/25/16 for generalized weakness.  There is no record of frequent ED visits for falls. She stated that she feels he needs to be in a skilled faciilty to better manage his needs. When asked if this has been discussed with him, she stated that it has and he refused and doesn't want to go stating that " they will take my check."  Explained to her that the patient has to be agreeable to SNF or ALF. A referral can't be made unless he is in agreement.  If she has concerns about his safety, she can always call APS.  She stated that she didn't want to contact APS at this time.   She was agreeable to submitting  a request for an increase in [facility] services but understands that will only increase the hours from 66 to 80 hours/month if he is approved.  This CM also provided her with the # for the CAP program # [phone]. She stated that she would call CAP and put his name on the waiting list. This CM explained that he needs to be in agreement to putting his name on the CAP waiting list.   Explained to her that [facility] can't accept the  referral for home health services. She said that she would check with him to determine if he has a preference for home health agencies and would let this CM know his decision.

## 2017-02-23 ENCOUNTER — Telehealth: Payer: Self-pay | Admitting: Family Medicine

## 2017-02-23 ENCOUNTER — Ambulatory Visit (INDEPENDENT_AMBULATORY_CARE_PROVIDER_SITE_OTHER): Payer: Medicare Other | Admitting: Orthopaedic Surgery

## 2017-02-23 NOTE — Telephone Encounter (Signed)
Call placed to [facility] to verify if they are now accepting new home health referral in patient's area. Spoke with representative and she informed me that they are not at the moment.   Call placed to patient to inform him that [facility] isn't taking new referrals for home health. Patient understood. Asked patient if he had another agency of choice and patient said "no". When I asked patient if he had had a nurse come out to see him in the past, patient said yes and they worked for [employer]. Asked patient if I could send his information to [facility], patient said "yes".

## 2017-02-24 ENCOUNTER — Telehealth: Payer: Self-pay

## 2017-02-24 ENCOUNTER — Telehealth: Payer: Self-pay | Admitting: Family Medicine

## 2017-02-24 NOTE — Telephone Encounter (Signed)
Completed PCS form faxed to [facility]

## 2017-02-24 NOTE — Telephone Encounter (Signed)
Call placed to [facility] [phone], regarding patient's home health referral. Spoke with Rosalita Chessman and she informed me that referral was received but that's pending.

## 2017-02-26 ENCOUNTER — Telehealth: Payer: Self-pay | Admitting: Family Medicine

## 2017-02-26 NOTE — Telephone Encounter (Signed)
Ok to give verbal order.

## 2017-02-26 NOTE — Telephone Encounter (Signed)
Call placed to [facility] [phone], regarding patient's PCS referral. Spoke with Beckie Busing and she informed me that referral was received on 02/24/17 and that it is currently under review.

## 2017-02-26 NOTE — Telephone Encounter (Signed)
Nurse form home health called to request verbal orders for pt Skilled nursing  -once a week for 1 week -twice a week for 2 weeks -once a week for 5 weeks  -once a week as needed -OT-once a week  Please follow up [phone]

## 2017-02-26 NOTE — Telephone Encounter (Signed)
Ok

## 2017-02-26 NOTE — Telephone Encounter (Signed)
Call placed to [facility] [phone], regarding patient's referral. Spoke with [facility] and she informed me that the referral was received and that a nurse went out to see patient yesterday 02/25/17. PT will be out to see patient today 02/26/17. Nurse suggested that patient can benefit from OT therefore that has been added as well.

## 2017-03-01 ENCOUNTER — Telehealth: Payer: Self-pay

## 2017-03-01 ENCOUNTER — Telehealth: Payer: Self-pay | Admitting: Family Medicine

## 2017-03-01 NOTE — Telephone Encounter (Signed)
[facility] Sharyn Lull) called to get verbal order since has been complete the evaluation and need order for 2time a week for 5weeks and 1time a week for 1 week, please call Sharyn Lull [phone]

## 2017-03-01 NOTE — Telephone Encounter (Signed)
Levada Dy was called and a VM was left informing her to return phone call for verbal orders.

## 2017-03-01 NOTE — Telephone Encounter (Signed)
[name] was called and given the verbal order for occupational therapy.

## 2017-03-03 ENCOUNTER — Telehealth: Payer: Self-pay

## 2017-03-03 NOTE — Telephone Encounter (Signed)
Jacquline called and was given verbal orders for once a week for one week.

## 2017-03-08 NOTE — Telephone Encounter (Signed)
[name] was called and given verbal orders for 1 more visit.

## 2017-03-08 NOTE — Telephone Encounter (Signed)
Social worker called and said she needs more orders for 1 more visit. Please call her back at  [place]

## 2017-03-17 ENCOUNTER — Telehealth: Payer: Self-pay | Admitting: Family Medicine

## 2017-03-17 ENCOUNTER — Other Ambulatory Visit: Payer: Self-pay

## 2017-03-17 ENCOUNTER — Encounter (HOSPITAL_COMMUNITY): Payer: Self-pay | Admitting: Emergency Medicine

## 2017-03-17 ENCOUNTER — Emergency Department (HOSPITAL_COMMUNITY)
Admission: EM | Admit: 2017-03-17 | Discharge: 2017-03-17 | Disposition: A | Discharge status: Home / Self Care | Payer: Medicare Other | Attending: Emergency Medicine | Admitting: Emergency Medicine

## 2017-03-17 ENCOUNTER — Emergency Department (HOSPITAL_COMMUNITY): Payer: Medicare Other

## 2017-03-17 DIAGNOSIS — I110 Hypertensive heart disease with heart failure: Secondary | ICD-10-CM | POA: Insufficient documentation

## 2017-03-17 DIAGNOSIS — J45909 Unspecified asthma, uncomplicated: Secondary | ICD-10-CM | POA: Diagnosis not present

## 2017-03-17 DIAGNOSIS — E860 Dehydration: Secondary | ICD-10-CM | POA: Insufficient documentation

## 2017-03-17 DIAGNOSIS — R197 Diarrhea, unspecified: Secondary | ICD-10-CM | POA: Insufficient documentation

## 2017-03-17 DIAGNOSIS — I252 Old myocardial infarction: Secondary | ICD-10-CM | POA: Insufficient documentation

## 2017-03-17 DIAGNOSIS — J441 Chronic obstructive pulmonary disease with (acute) exacerbation: Secondary | ICD-10-CM

## 2017-03-17 DIAGNOSIS — I5032 Chronic diastolic (congestive) heart failure: Secondary | ICD-10-CM | POA: Insufficient documentation

## 2017-03-17 DIAGNOSIS — F17210 Nicotine dependence, cigarettes, uncomplicated: Secondary | ICD-10-CM | POA: Diagnosis not present

## 2017-03-17 DIAGNOSIS — Z96659 Presence of unspecified artificial knee joint: Secondary | ICD-10-CM | POA: Insufficient documentation

## 2017-03-17 DIAGNOSIS — Z8673 Personal history of transient ischemic attack (TIA), and cerebral infarction without residual deficits: Secondary | ICD-10-CM | POA: Insufficient documentation

## 2017-03-17 DIAGNOSIS — R0602 Shortness of breath: Secondary | ICD-10-CM | POA: Diagnosis present

## 2017-03-17 DIAGNOSIS — R509 Fever, unspecified: Secondary | ICD-10-CM | POA: Diagnosis not present

## 2017-03-17 DIAGNOSIS — I2510 Atherosclerotic heart disease of native coronary artery without angina pectoris: Secondary | ICD-10-CM | POA: Diagnosis not present

## 2017-03-17 LAB — CBC WITH DIFFERENTIAL/PLATELET
BASOS PCT: 0 %
Basophils Absolute: 0 10*3/uL (ref 0.0–0.1)
EOS ABS: 0.2 10*3/uL (ref 0.0–0.7)
Eosinophils Relative: 2 %
HCT: 40.9 % (ref 39.0–52.0)
HEMOGLOBIN: 13.2 g/dL (ref 13.0–17.0)
Lymphocytes Relative: 12 %
Lymphs Abs: 1.6 10*3/uL (ref 0.7–4.0)
MCH: 30.1 pg (ref 26.0–34.0)
MCHC: 32.3 g/dL (ref 30.0–36.0)
MCV: 93.4 fL (ref 78.0–100.0)
Monocytes Absolute: 1.5 10*3/uL — ABNORMAL HIGH (ref 0.1–1.0)
Monocytes Relative: 12 %
NEUTROS PCT: 74 %
Neutro Abs: 9.8 10*3/uL — ABNORMAL HIGH (ref 1.7–7.7)
Platelets: 136 10*3/uL — ABNORMAL LOW (ref 150–400)
RBC: 4.38 MIL/uL (ref 4.22–5.81)
RDW: 15.3 % (ref 11.5–15.5)
WBC: 13.1 10*3/uL — ABNORMAL HIGH (ref 4.0–10.5)

## 2017-03-17 LAB — COMPREHENSIVE METABOLIC PANEL
ALBUMIN: 3.8 g/dL (ref 3.5–5.0)
ALK PHOS: 65 U/L (ref 38–126)
ALT: 18 U/L (ref 17–63)
ANION GAP: 8 (ref 5–15)
AST: 22 U/L (ref 15–41)
BILIRUBIN TOTAL: 0.8 mg/dL (ref 0.3–1.2)
BUN: 21 mg/dL — ABNORMAL HIGH (ref 6–20)
CALCIUM: 9.5 mg/dL (ref 8.9–10.3)
CO2: 26 mmol/L (ref 22–32)
Chloride: 108 mmol/L (ref 101–111)
Creatinine, Ser: 0.98 mg/dL (ref 0.61–1.24)
GLUCOSE: 122 mg/dL — AB (ref 65–99)
Potassium: 4 mmol/L (ref 3.5–5.1)
Sodium: 142 mmol/L (ref 135–145)
TOTAL PROTEIN: 6.6 g/dL (ref 6.5–8.1)

## 2017-03-17 LAB — PROTIME-INR
INR: 1.09
PROTHROMBIN TIME: 14 s (ref 11.4–15.2)

## 2017-03-17 LAB — INFLUENZA PANEL BY PCR (TYPE A & B)
Influenza A By PCR: NEGATIVE
Influenza B By PCR: NEGATIVE

## 2017-03-17 LAB — I-STAT CG4 LACTIC ACID, ED: LACTIC ACID, VENOUS: 1.46 mmol/L (ref 0.5–1.9)

## 2017-03-17 MED ORDER — IPRATROPIUM-ALBUTEROL 0.5-2.5 (3) MG/3ML IN SOLN
3.0000 mL | Freq: Once | RESPIRATORY_TRACT | Status: AC
  Administered 2017-03-17: 3 mL via RESPIRATORY_TRACT
  Filled 2017-03-17: qty 3

## 2017-03-17 MED ORDER — VANCOMYCIN HCL IN DEXTROSE 1-5 GM/200ML-% IV SOLN
1000.0000 mg | Freq: Once | INTRAVENOUS | Status: AC
  Administered 2017-03-17: 1000 mg via INTRAVENOUS
  Filled 2017-03-17: qty 200

## 2017-03-17 MED ORDER — PREDNISONE 20 MG PO TABS
60.0000 mg | ORAL_TABLET | Freq: Once | ORAL | Status: AC
  Administered 2017-03-17: 60 mg via ORAL
  Filled 2017-03-17: qty 3

## 2017-03-17 MED ORDER — PREDNISONE 10 MG (21) PO TBPK: ORAL_TABLET | ORAL | 0 refills | Status: DC

## 2017-03-17 MED ORDER — SODIUM CHLORIDE 0.9 % IV BOLUS (SEPSIS)
1000.0000 mL | Freq: Once | INTRAVENOUS | Status: AC
  Administered 2017-03-17: 1000 mL via INTRAVENOUS

## 2017-03-17 MED ORDER — PIPERACILLIN-TAZOBACTAM 3.375 G IVPB 30 MIN
3.3750 g | Freq: Once | INTRAVENOUS | Status: AC
  Administered 2017-03-17: 3.375 g via INTRAVENOUS
  Filled 2017-03-17: qty 50

## 2017-03-17 MED ORDER — SODIUM CHLORIDE 0.9 % IV BOLUS (SEPSIS)
500.0000 mL | Freq: Once | INTRAVENOUS | Status: AC
  Administered 2017-03-17: 500 mL via INTRAVENOUS

## 2017-03-17 NOTE — Telephone Encounter (Signed)
Patient nurse called and gave report of Bp of 100/62, temperature running between 99-101, 02 stats between 88-90, patient is experiencing diarrhea episodes, coughing up green/yellow mucus, and lungs have bilateral bronchi. Patient care nurse stated she is sending patient to ED.

## 2017-03-17 NOTE — Telephone Encounter (Signed)
[facility] OT called to report that Pt has  A temperature on 97.6 and complaining of body pain and couth, he did not want to schedule an appt with his PCP, he said will wait until his next appt,

## 2017-03-17 NOTE — Discharge Instructions (Signed)
Drink plenty of fluids, follow-up with your doctor later this week or early next week to make sure you are improving.  Return as needed for worsening symptoms.

## 2017-03-17 NOTE — Telephone Encounter (Signed)
Will route to PCP 

## 2017-03-17 NOTE — Telephone Encounter (Signed)
Noted  

## 2017-03-17 NOTE — ED Notes (Signed)
Put patient in lobby by phone. Called 5 different numbers for patient. No one answer. Patient is in lobby by phone. Offered to call taxi for patient. Patient declined.

## 2017-03-17 NOTE — Telephone Encounter (Signed)
Has been referred to the ED as per subsequent notes

## 2017-03-17 NOTE — ED Provider Notes (Signed)
[facility] DEPT Provider Note   CSN: [id number] Arrival date & time: 03/17/17  1218     History   Chief Complaint Chief Complaint  Patient presents with  . Shortness of Breath    HPI [name] is a 67 y.o. male.  HPI Patient presents to the emergency room for evaluation of cough congestion and flulike symptoms.  Patient states that yesterday he started having cough fever and chills.  He was not able to take his temperature but he felt like he was developing a fever.  He started to develop body aches and and felt short of breath.  He denies any nausea or vomiting.  No chest pain.  He has had some loose stools.  Patient does have a history of COPD as well as CHF.  He does continue to smoke.  He did not get a flu shot this year.  Past Medical History:  Diagnosis Date  . Alcohol abuse    H/O  . Anxiety   . Arthritis   . Asthma   . CAD (coronary artery disease) 05/27/10   Cath: severe single vessell CAD left cx midportion obtuse marginal 2 to 3.  [name] CHF (congestive heart failure) ([place])   . COPD (chronic obstructive pulmonary disease) ([place])   . COPD (chronic obstructive pulmonary disease) ([place])   . Depression   . Diabetes mellitus, type 2 ([place]) 03/13/2015  . GERD (gastroesophageal reflux disease)   . HCAP (healthcare-associated pneumonia) 10/15/2015  . History of DVT of lower extremity   . Hyperlipemia   . Hyperlipidemia   . Hypertension   . Myocardial infarction ([place]) 2000  . Orbital fracture ([place]) 12/2012  . OSA on CPAP   . Peripheral vascular disease, unspecified ([place])    08/20/10 doppler: increase in right ABI post-op. Left ABI stable. S/P bi-fem bypass surgery  . Pneumonia 04/04/2012  . Shortness of breath   . Stroke ([place])   . Tobacco abuse     Patient Active Problem List   Diagnosis Date Noted  . Cerebral thrombosis with cerebral infarction 07/18/2016  . Acute ischemic stroke ([place])   . Left hemiplegia ([place])   . Stroke-like  symptoms 07/17/2016  . Insomnia 01/29/2016  . Depression 01/29/2016  . Alcohol abuse with intoxication ([place]) 10/16/2015  . Psoriasiform dermatitis 07/12/2015  . Dementia due to another medical condition 07/12/2015  . Urinary incontinence 03/14/2015  . Diabetes mellitus, type 2 ([place]) 03/13/2015  . Hemiparesis affecting left side as late effect of cerebrovascular accident ([place]) 02/18/2015  . Cerebrovascular accident (CVA) due to stenosis of right carotid artery ([place])   . Coronary artery disease involving native coronary artery of native heart without angina pectoris   . Tachypnea   . Thrombocytopenia ([place])   . History of stroke   . Stroke (cerebrum) ([place]) 02/06/2015  . Arterial ischemic stroke, MCA (middle cerebral artery), right, acute ([place])   . DOE (dyspnea on exertion) 11/23/2014  . PAF (paroxysmal atrial fibrillation) ([place]) 11/23/2014  . HLD (hyperlipidemia) 11/01/2014  . Tobacco use disorder 11/01/2014  . Diastolic CHF, acute on chronic (HCC) 11/01/2014  . Cerebral infarction due to stenosis of right carotid artery ([place]) 11/01/2014  . Carotid stenosis 11/01/2014  . Hyperkalemia 10/24/2014  . H/O total knee replacement 10/24/2014  . Essential hypertension   . Polysubstance abuse ([place]) 08/28/2014  . Cocaine abuse ([place])   . Intracranial carotid stenosis   . Headache around the eyes 08/27/2014  . Acute CVA (cerebrovascular accident) ([place]) 08/27/2014  .  CVA (cerebral vascular accident) ([place]) 08/26/2014  . History of DVT (deep vein thrombosis)   . Alcoholism ([place])   . Embolic stroke ([place])   . Cerebral infarction due to unspecified mechanism   . Hypotension   . AKI (acute kidney injury) ([place])   . Stroke ([place])   . CVA (cerebral infarction) 08/01/2014  . Alcohol dependence with withdrawal with complication ([place]) 40/09/6759  . DVT (deep venous thrombosis) ([place]) 02/22/2014  . Lower extremity edema 02/21/2014  . Chronic diastolic congestive heart failure ([place]) 11/21/2013  . CAD  (coronary artery disease) 11/21/2013  . Alcohol abuse 11/21/2013  . GERD (gastroesophageal reflux disease) 08/10/2013  . Gout 08/10/2013  . Tobacco abuse 07/26/2013  . Trichiasis without entropion 04/16/2013  . Dizziness 04/10/2013  . Eyelid lesion 01/24/2013  . Enophthalmos due to trauma 01/19/2013  . Medial orbital wall fracture (HCC) 01/19/2013  . Closed blow-out fracture of floor of orbit ([place]) 01/19/2013  . Binocular vision disorder with diplopia 01/03/2013  . Fracture of inferior orbital wall ([place]) 01/03/2013  . Fracture of orbital floor ([place]) 01/03/2013  . Pain of left eye 01/03/2013  . Peripheral vascular disease ([place]) 07/27/2012  . COPD (chronic obstructive pulmonary disease) ([place]) 04/04/2012  . HTN (hypertension) 04/04/2012    Past Surgical History:  Procedure Laterality Date  . abdoninal ao angio & bifem angio  05/27/10   Patent graft, occluded bil stents with no retrograde flow into the hypogastric arteries. 100% occl left ant. tibial artery, 70% to 80% to 100% stenosis right superficial fem artery above adductor canal. 100 % occl right ant. tibial vessell  . Aortogram w/ PTCA  09/292003  . BACK SURGERY    . CARDIAC CATHETERIZATION  05/27/10   severe CAD left cx  . CHOLECYSTECTOMY    . ESOPHAGOGASTRIC FUNDOPLICATION    . EYE SURGERY    . FEMORAL BYPASS  08/19/10   Right Fem-Pop  . IR ANGIO INTRA EXTRACRAN SEL COM CAROTID INNOMINATE BILAT MOD SED  07/20/2016  . IR ANGIO VERTEBRAL SEL SUBCLAVIAN INNOMINATE UNI R MOD SED  07/20/2016  . IR ANGIO VERTEBRAL SEL VERTEBRAL UNI L MOD SED  07/20/2016  . IR RADIOLOGIST EVAL & MGMT  05/27/2016  . JOINT REPLACEMENT    . PERIPHERAL VASCULAR CATHETERIZATION N/A 12/10/2014   Procedure: Abdominal Aortogram;  Surgeon: Angelia Mould, MD;  Location: [facility];  Service: Cardiovascular;  Laterality: N/A;  . RADIOLOGY WITH ANESTHESIA N/A 02/08/2015   Procedure: RADIOLOGY WITH ANESTHESIA;  Surgeon: Luanne Bras, MD;   Location: [facility];  Service: Radiology;  Laterality: N/A;  . TEE WITHOUT CARDIOVERSION N/A 08/29/2014   Procedure: TRANSESOPHAGEAL ECHOCARDIOGRAM (TEE);  Surgeon: Josue Hector, MD;  Location: [facility];  Service: Cardiovascular;  Laterality: N/A;  . TOTAL KNEE ARTHROPLASTY         Home Medications    Prior to Admission medications   Medication Sig Start Date End Date Taking? Authorizing Provider  ACCU-CHEK SOFTCLIX LANCETS lancets Use 3 times daily before meals 09/17/15   Charlott Rakes, MD  albuterol (PROAIR HFA) 108 (90 Base) MCG/ACT inhaler Inhale 2 puffs into the lungs every 6 (six) hours as needed for wheezing or shortness of breath. 01/26/17   Charlott Rakes, MD  albuterol (PROVENTIL) (2.5 MG/3ML) 0.083% nebulizer solution Take 3 mLs (2.5 mg total) by nebulization every 6 (six) hours as needed for wheezing or shortness of breath. 01/27/17   Charlott Rakes, MD  allopurinol (ZYLOPRIM) 300 MG tablet Take 1 tablet (300 mg  total) by mouth daily. 01/26/17   Charlott Rakes, MD  atorvastatin (LIPITOR) 80 MG tablet Take 1 tablet (80 mg total) by mouth daily at 6 PM. 01/26/17   Charlott Rakes, MD  Blood Glucose Monitoring Suppl (ACCU-CHEK AVIVA) device Use 3 times daily before meals. 03/13/15   Charlott Rakes, MD  carvedilol (COREG) 12.5 MG tablet Take 1 tablet (12.5 mg total) by mouth 2 (two) times daily with a meal. 01/26/17   Charlott Rakes, MD  clopidogrel (PLAVIX) 75 MG tablet Take 1 tablet (75 mg total) by mouth daily. 01/26/17   Charlott Rakes, MD  FLUoxetine (PROZAC) 20 MG capsule TAKE ONE CAPSULE BY MOUTH ONCE DAILY (AM) 10/02/16   [provider]  glucose blood (ACCU-CHEK AVIVA) test strip Use 3 times daily before meals as directed. 01/29/16   Charlott Rakes, MD  hydrALAZINE (APRESOLINE) 50 MG tablet Take 50 mg by mouth daily.  06/18/16   [provider]  lactulose (CHRONULAC) 10 GM/15ML solution Take 15 mLs (10 g total) by mouth 2 (two) times daily as needed for  mild constipation. 04/28/16   Charlott Rakes, MD  Lancet Devices [facility]) lancets Use 3 times daily before meals 09/27/15   Charlott Rakes, MD  lisinopril (PRINIVIL,ZESTRIL) 5 MG tablet Take 1 tablet (5 mg total) by mouth daily. Patient not taking: Reported on 12/25/2016 09/22/16   Charlott Rakes, MD  memantine (NAMENDA) 10 MG tablet Take 1 tablet (10 mg total) by mouth 2 (two) times daily. 01/26/17   Charlott Rakes, MD  Misc. Devices (ROLLATOR) MISC 1 each by Does not apply route daily. 02/27/15   Charlott Rakes, MD  pantoprazole (PROTONIX) 40 MG tablet Take 1 tablet (40 mg total) by mouth every morning. 01/26/17   Charlott Rakes, MD  polyethylene glycol (MIRALAX / GLYCOLAX) packet Take 17 g by mouth daily. 07/22/16   Diallo, Earna Coder, MD  predniSONE (STERAPRED UNI-PAK 21 TAB) 10 MG (21) TBPK tablet Take 6 tabs by mouth daily  for 2 days, then 5 tabs for 2 days, then 4 tabs for 2 days, then 3 tabs for 2 days, 2 tabs for 2 days, then 1 tab by mouth daily for 2 days 03/17/17   Dorie Rank, MD  pregabalin (LYRICA) 150 MG capsule Take 1 capsule (150 mg total) 2 (two) times daily by mouth. 12/25/16   Charlott Rakes, MD  Selenium Sulfide 2.25 % SHAM Apply 1 application topically 2 (two) times a week. 01/28/17   Charlott Rakes, MD  tiZANidine (ZANAFLEX) 4 MG tablet Take 1 tablet (4 mg total) by mouth 3 (three) times daily. 01/26/17   Charlott Rakes, MD  traZODone (DESYREL) 50 MG tablet Take 1 tablet (50 mg total) by mouth at bedtime as needed for sleep. 01/26/17   Charlott Rakes, MD  triamcinolone cream (KENALOG) 0.1 % Apply 1 application topically daily as needed (for dermatitis). 06/02/16   Charlott Rakes, MD  valACYclovir (VALTREX) 1000 MG tablet Take 1 tablet (1,000 mg total) by mouth 2 (two) times daily. 01/26/17   Charlott Rakes, MD    Family History Family History  Problem Relation Age of Onset  . Heart attack Mother   . Heart failure Mother   . Cirrhosis Father   . Heart  failure Brother   . Cancer Brother   . Hypertension Neg Hx        UNKNOWN  . Stroke Neg Hx        UNKNOWN    Social History Social History   Tobacco Use  .  Smoking status: Current Every Day Smoker    Packs/day: 0.50    Years: 50.00    Pack years: 25.00    Types: Cigars, Cigarettes    Start date: 02/09/1961  . Smokeless tobacco: Former Systems developer    Types: Chew  . Tobacco comment: 2 ppd, full flavor  Substance Use Topics  . Alcohol use: No  . Drug use: No     Allergies   Darvocet [propoxyphene n-acetaminophen]; Haldol [haloperidol decanoate]; Acetaminophen; Metformin and related; and Norco [hydrocodone-acetaminophen]   Review of Systems Review of Systems  All other systems reviewed and are negative.    Physical Exam Updated Vital Signs BP 101/61   Pulse 70   Temp 98.9 F (37.2 C) (Oral)   Resp 14   Ht 1.829 m (6')   Wt 97.5 kg (215 lb)   SpO2 100%   BMI 29.16 kg/m   Physical Exam  Constitutional: No distress.  HENT:  Head: Normocephalic and atraumatic.  Right Ear: External ear normal.  Left Ear: External ear normal.  Eyes: Conjunctivae are normal. Right eye exhibits no discharge. Left eye exhibits no discharge. No scleral icterus.  Neck: Neck supple. No tracheal deviation present.  Cardiovascular: Normal rate, regular rhythm and intact distal pulses.  Pulmonary/Chest: Effort normal. No stridor. No respiratory distress. He has wheezes. He has no rales.  Abdominal: Soft. Bowel sounds are normal. He exhibits no distension. There is no tenderness. There is no rebound and no guarding.  Musculoskeletal: He exhibits no edema or tenderness.  Neurological: He is alert. He has normal strength. No cranial nerve deficit (no facial droop, extraocular movements intact, no slurred speech) or sensory deficit. He exhibits normal muscle tone. He displays no seizure activity. Coordination normal.  Skin: Skin is warm and dry. No rash noted.  Psychiatric: He has a normal mood and  affect.  Nursing note and vitals reviewed.    ED Treatments / Results  Labs (all labs ordered are listed, but only abnormal results are displayed) Labs Reviewed  COMPREHENSIVE METABOLIC PANEL - Abnormal; Notable for the following components:      Result Value   Glucose, Bld 122 (*)    BUN 21 (*)    All other components within normal limits  CBC WITH DIFFERENTIAL/PLATELET - Abnormal; Notable for the following components:   WBC 13.1 (*)    Platelets 136 (*)    Neutro Abs 9.8 (*)    Monocytes Absolute 1.5 (*)    All other components within normal limits  CULTURE, BLOOD (ROUTINE X 2)  CULTURE, BLOOD (ROUTINE X 2)  PROTIME-INR  INFLUENZA PANEL BY PCR (TYPE A & B)  URINALYSIS, ROUTINE W REFLEX MICROSCOPIC  I-STAT CG4 LACTIC ACID, ED    Radiology Dg Chest 2 View  Result Date: 03/17/2017 CLINICAL DATA:  Sepsis, cough last night, flu like symptoms EXAM: CHEST  2 VIEW COMPARISON:  01/05/2017, 12/12/2012 FINDINGS: There is mild bilateral chronic interstitial thickening. There is no focal parenchymal opacity. There is no pleural effusion or pneumothorax. The heart and mediastinal contours are unremarkable. The osseous structures are unremarkable. IMPRESSION: No active cardiopulmonary disease. Electronically Signed   By: Kathreen Devoid   On: 03/17/2017 13:17    Procedures Procedures (including critical care time)  Medications Ordered in ED Medications  predniSONE (DELTASONE) tablet 60 mg (not administered)  sodium chloride 0.9 % bolus 1,000 mL (0 mLs Intravenous Stopped 03/17/17 1509)    And  sodium chloride 0.9 % bolus 1,000 mL (0 mLs Intravenous Stopped 03/17/17  1510)    And  sodium chloride 0.9 % bolus 1,000 mL (1,000 mLs Intravenous New Bag/Given 03/17/17 1516)    And  sodium chloride 0.9 % bolus 500 mL (500 mLs Intravenous New Bag/Given 03/17/17 1516)  piperacillin-tazobactam (ZOSYN) IVPB 3.375 g (0 g Intravenous Stopped 03/17/17 1510)  vancomycin (VANCOCIN) IVPB 1000 mg/200 mL premix (0  mg Intravenous Stopped 03/17/17 1510)  ipratropium-albuterol (DUONEB) 0.5-2.5 (3) MG/3ML nebulizer solution 3 mL (3 mLs Nebulization Given 03/17/17 1407)     Initial Impression / Assessment and Plan / ED Course  I have reviewed the triage vital signs and the nursing notes.  Pertinent labs & imaging results that were available during my care of the patient were reviewed by me and considered in my medical decision making (see chart for details).  Clinical Course as of Mar 17 1622  Wed Mar 17, 2017  1326 Patient did have a blood pressure 90 systolic in December.  We will proceed with fluid bolus for possible sepsis.  Continue to monitor closely.  Patient currently does not appear toxic  [JK]  1439 BP is improving with IV fluids.  [OH]  7290 Patient is feeling much better after treatment.  He was able to walk around the emergency room without any difficulty.  Is not feeling short of breath.  [JK]    Clinical Course User Index [JK] Dorie Rank, MD    Patient presented to the emergency room for, congestion and shortness of breath.  Patient's blood pressure was also low when he arrived.  Patient was covered for possible sepsis.  However there is no evidence of bacterial infection.  Chest x-ray does not show pneumonia.  Lactic acid levels are normal.  Patient was given breathing treatments for his wheezing.  He was also given IV fluids for his blood pressure.  His blood pressure has improved.  Patient was able to walk around the emergency room without any difficulty.  He does not require supplemental oxygen.  I suspect his symptoms are related to a COPD exacerbation as a result of a viral illness.  Plan on discharge home with steroids.  Discussed outpatient follow-up with his primary care doctor.  Patient is comfortable with this plan and agrees   Final Clinical Impressions(s) / ED Diagnoses   Final diagnoses:  COPD exacerbation ([place])  Dehydration    ED Discharge Orders        Ordered    predniSONE  (STERAPRED UNI-PAK 21 TAB) 10 MG (21) TBPK tablet     03/17/17 1620       Dorie Rank, MD 03/17/17 1624

## 2017-03-17 NOTE — Progress Notes (Signed)
A consult was received from an ED physician for Vancomycin and Zosyn per pharmacy dosing.  The patient's profile has been reviewed for ht/wt/allergies/indication/available labs. A one time order has been placed for the above antibiotics.  Further antibiotics/pharmacy consults should be ordered by admitting physician if indicated.                       Reuel Boom, PharmD, BCPS [phone] 03/17/2017, 1:17 PM

## 2017-03-17 NOTE — Care Management Note (Addendum)
Case Management Note  Patient Details  Name: [name] MRN: [id number] Date of Birth: 1950/12/13  CM noted pt was active with [facility].  Contacted Mike who confirmed [place] RN PT OT SW. Will follow for needs.  Expected Discharge Date:    Unknown              Expected Discharge Plan:  [place]  Post Acute Care Choice:  Home Health Choice offered to:  Patient  HH Arranged:  RN, PT, OT, Social Work [employer] Agency:  [place]  Status of Service:  In process, will continue to follow  Rae Mar, RN 03/17/2017, 2:08 PM

## 2017-03-17 NOTE — ED Triage Notes (Signed)
Pt sts cough last night, woke up this am with body aches and coughing. Flu like symptoms. Pt sts his BP always runs low in the 80's.

## 2017-03-18 ENCOUNTER — Telehealth (HOSPITAL_BASED_OUTPATIENT_CLINIC_OR_DEPARTMENT_OTHER): Payer: Self-pay | Admitting: *Deleted

## 2017-03-18 ENCOUNTER — Other Ambulatory Visit: Payer: Self-pay | Admitting: Family Medicine

## 2017-03-18 DIAGNOSIS — I10 Essential (primary) hypertension: Secondary | ICD-10-CM

## 2017-03-18 LAB — BLOOD CULTURE ID PANEL (REFLEXED)
Acinetobacter baumannii: NOT DETECTED
CANDIDA TROPICALIS: NOT DETECTED
Candida albicans: NOT DETECTED
Candida glabrata: NOT DETECTED
Candida krusei: NOT DETECTED
Candida parapsilosis: NOT DETECTED
Carbapenem resistance: NOT DETECTED
ENTEROBACTER CLOACAE COMPLEX: NOT DETECTED
ENTEROCOCCUS SPECIES: NOT DETECTED
Enterobacteriaceae species: NOT DETECTED
Escherichia coli: NOT DETECTED
HAEMOPHILUS INFLUENZAE: NOT DETECTED
Klebsiella oxytoca: NOT DETECTED
Klebsiella pneumoniae: NOT DETECTED
LISTERIA MONOCYTOGENES: NOT DETECTED
METHICILLIN RESISTANCE: NOT DETECTED
Neisseria meningitidis: NOT DETECTED
PROTEUS SPECIES: NOT DETECTED
Pseudomonas aeruginosa: NOT DETECTED
STAPHYLOCOCCUS AUREUS BCID: NOT DETECTED
STAPHYLOCOCCUS SPECIES: NOT DETECTED
STREPTOCOCCUS PYOGENES: NOT DETECTED
Serratia marcescens: NOT DETECTED
Streptococcus agalactiae: NOT DETECTED
Streptococcus pneumoniae: NOT DETECTED
Streptococcus species: NOT DETECTED
VANCOMYCIN RESISTANCE: NOT DETECTED

## 2017-03-19 LAB — CULTURE, BLOOD (ROUTINE X 2): SPECIAL REQUESTS: ADEQUATE

## 2017-03-20 ENCOUNTER — Telehealth: Payer: Self-pay

## 2017-03-20 NOTE — Telephone Encounter (Signed)
Post ED Visit - Positive Culture Follow-up  Culture report reviewed by antimicrobial stewardship pharmacist:  []  Elenor Quinones, Pharm.D. []  Heide Guile, Pharm.D., BCPS AQ-ID []  Parks Neptune, Pharm.D., BCPS []  Alycia Rossetti, Pharm.D., BCPS []  [place], Pharm.D., BCPS, AAHIVP []  Legrand Como, Pharm.D., BCPS, AAHIVP []  Salome Arnt, PharmD, BCPS []  Jalene Mullet, PharmD []  Vincenza Hews, PharmD, BCPS Pharm D Jeanett Schlein Positive [facility] culture and no further patient follow-up is required at this time.  Genia Del 03/20/2017, 11:32 AM

## 2017-03-22 ENCOUNTER — Other Ambulatory Visit: Payer: Self-pay | Admitting: Family Medicine

## 2017-03-22 DIAGNOSIS — J449 Chronic obstructive pulmonary disease, unspecified: Secondary | ICD-10-CM

## 2017-03-22 LAB — CULTURE, BLOOD (ROUTINE X 2)
Culture: NO GROWTH
Special Requests: ADEQUATE

## 2017-03-23 ENCOUNTER — Telehealth: Payer: Self-pay | Admitting: Family Medicine

## 2017-03-23 ENCOUNTER — Ambulatory Visit: Payer: Self-pay | Admitting: Family Medicine

## 2017-03-23 NOTE — Telephone Encounter (Signed)
[facility] called Laurence Slate) to inform that Pt was on th ed on 03/17/17 and was prescribe predniSONE (STERAPRED UNI-PAK 21 TAB) 10 MG (21) TBPK tablet  With the following instruction Take 6 tabs by mouth daily for 2 days, then 5 tabs for 2 days, then 4 tabs for 2 days, then 3 tabs for 2 days, 2 tabs for 2 days, then 1 tab by mouth daily for 2 days They need to be sign off by her pcp she need [place] to call her back at [phone] please follow up

## 2017-03-24 NOTE — Telephone Encounter (Signed)
Will route to PCP 

## 2017-03-24 NOTE — Telephone Encounter (Signed)
If he was prescribed that from the ED then he will need to proceed with taking it.

## 2017-03-26 ENCOUNTER — Telehealth: Payer: Self-pay | Admitting: Family Medicine

## 2017-03-26 NOTE — Telephone Encounter (Signed)
Patient refused services on 03/26/2017 form Education officer, museum.

## 2017-03-31 ENCOUNTER — Telehealth: Payer: Self-pay | Admitting: Family Medicine

## 2017-03-31 NOTE — Telephone Encounter (Signed)
Michelle from [facility] called and requested for a verbal order of OT 2x's a week for 3wks. Sharyn Lull stated he is improving and wanted some extra time. Please fu

## 2017-04-01 ENCOUNTER — Telehealth: Payer: Self-pay | Admitting: Family Medicine

## 2017-04-01 NOTE — Telephone Encounter (Signed)
2 page, Paperwork was received by fax 04/01/17 regarding medical necessities.

## 2017-04-01 NOTE — Telephone Encounter (Signed)
[name] was called and given the verbal orders for OT.

## 2017-04-05 ENCOUNTER — Other Ambulatory Visit: Payer: Self-pay | Admitting: Family Medicine

## 2017-04-05 ENCOUNTER — Telehealth: Payer: Self-pay | Admitting: Family Medicine

## 2017-04-05 DIAGNOSIS — E1149 Type 2 diabetes mellitus with other diabetic neurological complication: Secondary | ICD-10-CM

## 2017-04-05 MED ORDER — PREGABALIN 150 MG PO CAPS: 150.0000 mg | ORAL_CAPSULE | Freq: Two times a day (BID) | ORAL | 3 refills | Status: DC

## 2017-04-05 NOTE — Telephone Encounter (Signed)
Received fax requesting verbal order, fax will on the PCP in-box

## 2017-04-06 ENCOUNTER — Telehealth: Payer: Self-pay | Admitting: Family Medicine

## 2017-04-06 NOTE — Telephone Encounter (Signed)
Patient feel out of the chair and laid in the floor or one hour. He hurt his elbow and the nurse asked was it ok to put an antibiotic ointment on it. She said needed orders for this

## 2017-04-07 ENCOUNTER — Telehealth: Payer: Self-pay | Admitting: Family Medicine

## 2017-04-07 NOTE — Telephone Encounter (Signed)
Received a fax/order from [facility], fax will be on the pcp in-box

## 2017-04-12 DIAGNOSIS — Z7902 Long term (current) use of antithrombotics/antiplatelets: Secondary | ICD-10-CM | POA: Diagnosis not present

## 2017-04-12 DIAGNOSIS — I110 Hypertensive heart disease with heart failure: Secondary | ICD-10-CM | POA: Diagnosis not present

## 2017-04-12 DIAGNOSIS — J449 Chronic obstructive pulmonary disease, unspecified: Secondary | ICD-10-CM | POA: Diagnosis not present

## 2017-04-12 DIAGNOSIS — I739 Peripheral vascular disease, unspecified: Secondary | ICD-10-CM | POA: Diagnosis not present

## 2017-04-12 DIAGNOSIS — I69354 Hemiplegia and hemiparesis following cerebral infarction affecting left non-dominant side: Secondary | ICD-10-CM | POA: Diagnosis not present

## 2017-04-12 DIAGNOSIS — I509 Heart failure, unspecified: Secondary | ICD-10-CM | POA: Diagnosis not present

## 2017-04-12 DIAGNOSIS — I2510 Atherosclerotic heart disease of native coronary artery without angina pectoris: Secondary | ICD-10-CM | POA: Diagnosis not present

## 2017-04-12 DIAGNOSIS — Z9181 History of falling: Secondary | ICD-10-CM | POA: Diagnosis not present

## 2017-04-12 DIAGNOSIS — E119 Type 2 diabetes mellitus without complications: Secondary | ICD-10-CM | POA: Diagnosis not present

## 2017-04-13 ENCOUNTER — Emergency Department (HOSPITAL_COMMUNITY): Payer: Medicare Other

## 2017-04-13 ENCOUNTER — Emergency Department (HOSPITAL_COMMUNITY)
Admission: EM | Admit: 2017-04-13 | Discharge: 2017-04-13 | Disposition: A | Discharge status: Home / Self Care | Payer: Medicare Other | Attending: Emergency Medicine | Admitting: Emergency Medicine

## 2017-04-13 ENCOUNTER — Other Ambulatory Visit: Payer: Self-pay | Admitting: Family Medicine

## 2017-04-13 ENCOUNTER — Encounter (HOSPITAL_COMMUNITY): Payer: Self-pay | Admitting: Emergency Medicine

## 2017-04-13 DIAGNOSIS — Z79899 Other long term (current) drug therapy: Secondary | ICD-10-CM | POA: Insufficient documentation

## 2017-04-13 DIAGNOSIS — S0990XA Unspecified injury of head, initial encounter: Secondary | ICD-10-CM | POA: Insufficient documentation

## 2017-04-13 DIAGNOSIS — E119 Type 2 diabetes mellitus without complications: Secondary | ICD-10-CM | POA: Insufficient documentation

## 2017-04-13 DIAGNOSIS — J449 Chronic obstructive pulmonary disease, unspecified: Secondary | ICD-10-CM | POA: Insufficient documentation

## 2017-04-13 DIAGNOSIS — Y998 Other external cause status: Secondary | ICD-10-CM | POA: Insufficient documentation

## 2017-04-13 DIAGNOSIS — S299XXA Unspecified injury of thorax, initial encounter: Secondary | ICD-10-CM | POA: Diagnosis not present

## 2017-04-13 DIAGNOSIS — S199XXA Unspecified injury of neck, initial encounter: Secondary | ICD-10-CM | POA: Diagnosis not present

## 2017-04-13 DIAGNOSIS — I5032 Chronic diastolic (congestive) heart failure: Secondary | ICD-10-CM | POA: Diagnosis not present

## 2017-04-13 DIAGNOSIS — Z9181 History of falling: Secondary | ICD-10-CM | POA: Diagnosis not present

## 2017-04-13 DIAGNOSIS — J45909 Unspecified asthma, uncomplicated: Secondary | ICD-10-CM | POA: Insufficient documentation

## 2017-04-13 DIAGNOSIS — I739 Peripheral vascular disease, unspecified: Secondary | ICD-10-CM | POA: Diagnosis not present

## 2017-04-13 DIAGNOSIS — W19XXXA Unspecified fall, initial encounter: Secondary | ICD-10-CM | POA: Diagnosis not present

## 2017-04-13 DIAGNOSIS — F17210 Nicotine dependence, cigarettes, uncomplicated: Secondary | ICD-10-CM | POA: Diagnosis not present

## 2017-04-13 DIAGNOSIS — R531 Weakness: Secondary | ICD-10-CM | POA: Insufficient documentation

## 2017-04-13 DIAGNOSIS — Y939 Activity, unspecified: Secondary | ICD-10-CM | POA: Insufficient documentation

## 2017-04-13 DIAGNOSIS — R42 Dizziness and giddiness: Secondary | ICD-10-CM | POA: Diagnosis not present

## 2017-04-13 DIAGNOSIS — S098XXA Other specified injuries of head, initial encounter: Secondary | ICD-10-CM | POA: Diagnosis not present

## 2017-04-13 DIAGNOSIS — I2510 Atherosclerotic heart disease of native coronary artery without angina pectoris: Secondary | ICD-10-CM | POA: Diagnosis not present

## 2017-04-13 DIAGNOSIS — Y92019 Unspecified place in single-family (private) house as the place of occurrence of the external cause: Secondary | ICD-10-CM | POA: Insufficient documentation

## 2017-04-13 DIAGNOSIS — F17290 Nicotine dependence, other tobacco product, uncomplicated: Secondary | ICD-10-CM | POA: Insufficient documentation

## 2017-04-13 DIAGNOSIS — Z96659 Presence of unspecified artificial knee joint: Secondary | ICD-10-CM | POA: Diagnosis not present

## 2017-04-13 DIAGNOSIS — Z7902 Long term (current) use of antithrombotics/antiplatelets: Secondary | ICD-10-CM | POA: Insufficient documentation

## 2017-04-13 DIAGNOSIS — I509 Heart failure, unspecified: Secondary | ICD-10-CM | POA: Diagnosis not present

## 2017-04-13 DIAGNOSIS — I110 Hypertensive heart disease with heart failure: Secondary | ICD-10-CM | POA: Diagnosis not present

## 2017-04-13 DIAGNOSIS — S064X0A Epidural hemorrhage without loss of consciousness, initial encounter: Secondary | ICD-10-CM | POA: Diagnosis not present

## 2017-04-13 DIAGNOSIS — I252 Old myocardial infarction: Secondary | ICD-10-CM | POA: Insufficient documentation

## 2017-04-13 DIAGNOSIS — Z8673 Personal history of transient ischemic attack (TIA), and cerebral infarction without residual deficits: Secondary | ICD-10-CM | POA: Diagnosis not present

## 2017-04-13 DIAGNOSIS — I69354 Hemiplegia and hemiparesis following cerebral infarction affecting left non-dominant side: Secondary | ICD-10-CM | POA: Diagnosis not present

## 2017-04-13 DIAGNOSIS — I10 Essential (primary) hypertension: Secondary | ICD-10-CM

## 2017-04-13 DIAGNOSIS — R55 Syncope and collapse: Secondary | ICD-10-CM | POA: Diagnosis not present

## 2017-04-13 LAB — CBC WITH DIFFERENTIAL/PLATELET
BASOS ABS: 0.1 10*3/uL (ref 0.0–0.1)
Basophils Relative: 1 %
Eosinophils Absolute: 0.5 10*3/uL (ref 0.0–0.7)
Eosinophils Relative: 9 %
HCT: 42 % (ref 39.0–52.0)
Hemoglobin: 13.9 g/dL (ref 13.0–17.0)
LYMPHS ABS: 2 10*3/uL (ref 0.7–4.0)
LYMPHS PCT: 35 %
MCH: 32.6 pg (ref 26.0–34.0)
MCHC: 33.1 g/dL (ref 30.0–36.0)
MCV: 98.4 fL (ref 78.0–100.0)
Monocytes Absolute: 0.6 10*3/uL (ref 0.1–1.0)
Monocytes Relative: 10 %
NEUTROS ABS: 2.5 10*3/uL (ref 1.7–7.7)
Neutrophils Relative %: 45 %
PLATELETS: 52 10*3/uL — AB (ref 150–400)
RBC: 4.27 MIL/uL (ref 4.22–5.81)
RDW: 15.3 % (ref 11.5–15.5)
WBC: 5.6 10*3/uL (ref 4.0–10.5)

## 2017-04-13 LAB — BASIC METABOLIC PANEL
ANION GAP: 9 (ref 5–15)
BUN: 12 mg/dL (ref 6–20)
CHLORIDE: 112 mmol/L — AB (ref 101–111)
CO2: 23 mmol/L (ref 22–32)
Calcium: 9.1 mg/dL (ref 8.9–10.3)
Creatinine, Ser: 0.91 mg/dL (ref 0.61–1.24)
GFR calc Af Amer: >60 mL/min (ref >60)
GLUCOSE: 102 mg/dL — AB (ref 65–99)
POTASSIUM: 4.5 mmol/L (ref 3.5–5.1)
Sodium: 144 mmol/L (ref 135–145)

## 2017-04-13 MED ORDER — SODIUM CHLORIDE 0.9 % IV SOLN: INTRAVENOUS | Status: DC

## 2017-04-13 NOTE — ED Notes (Signed)
Pt is provided with dinner tray

## 2017-04-13 NOTE — ED Notes (Signed)
Pt returned to room from CT and radiology

## 2017-04-13 NOTE — ED Triage Notes (Signed)
Per home health nurse pot has had dizziness x 6 months or so, fell yesterday , woke up on floor has  No memory, has hematoma to back of head, today Home health nurse did ortho statics and they were down, has residual from past stokes left side, having head pain , left shoulder pain and still dizzy when nhe stands , per aid  He needs to have bp meds changed

## 2017-04-13 NOTE — Discharge Instructions (Signed)
Workup for the head injury from the fall head CT was negative CT of cervical spine also negative for any acute or bony injuries.  Patient's blood pressures here standing or lying systolic pressure remained above 100.  But do agree he is on 3 different blood pressure medicines I recommend holding the hydralazine and just continuing the others.  Obviously follow his blood pressures to see how he responds.

## 2017-04-13 NOTE — ED Notes (Signed)
Pt awaiting transport via [facility]

## 2017-04-13 NOTE — ED Notes (Signed)
Pt called his ride.

## 2017-04-13 NOTE — ED Notes (Signed)
ED Provider at bedside for re-evaluation

## 2017-04-13 NOTE — ED Notes (Signed)
Patient transported to X-ray 

## 2017-04-13 NOTE — ED Notes (Signed)
IVT advises to apply a cold compress to site.  Cold compress applied.

## 2017-04-13 NOTE — ED Provider Notes (Signed)
[facility] EMERGENCY DEPARTMENT Provider Note   CSN: [id number] Arrival date & time: 04/13/17  1048     History   Chief Complaint Chief Complaint  Patient presents with  . Fall  . Dizziness  . Head Injury    HPI [name] is a 67 y.o. male.  Patient brought in by [facility].  Patient home health nurse that is out twice a week stated the patient had dizziness for 6 months.  No real change in that had a fall yesterday.  Woke up on the floor we think yesterday no memory.  She noted that there was swelling to the left parietal back of the head area.  Home nurse noted that he had orthostatic blood pressures.  Became symptomatic when standing.  Patient has a previous stroke with left sided weakness mostly upper extremity.  His blood pressure was apparently low when patient stood.  Home nurse is also requesting blood pressure medicines be changed.  Patient was seen February 6 for shortness of breath and it was a COPD exacerbation he was sent home.  Last admission was for his acute ischemic stroke which was in June 2018.      Past Medical History:  Diagnosis Date  . Alcohol abuse    H/O  . Anxiety   . Arthritis   . Asthma   . CAD (coronary artery disease) 05/27/10   Cath: severe single vessell CAD left cx midportion obtuse marginal 2 to 3.  [name] CHF (congestive heart failure) ([place])   . COPD (chronic obstructive pulmonary disease) ([place])   . COPD (chronic obstructive pulmonary disease) ([place])   . Depression   . Diabetes mellitus, type 2 ([place]) 03/13/2015  . GERD (gastroesophageal reflux disease)   . HCAP (healthcare-associated pneumonia) 10/15/2015  . History of DVT of lower extremity   . Hyperlipemia   . Hyperlipidemia   . Hypertension   . Myocardial infarction ([place]) 2000  . Orbital fracture ([place]) 12/2012  . OSA on CPAP   . Peripheral vascular disease, unspecified ([place])    08/20/10 doppler: increase in right ABI post-op. Left ABI stable. S/P bi-fem bypass  surgery  . Pneumonia 04/04/2012  . Shortness of breath   . Stroke ([place])   . Tobacco abuse     Patient Active Problem List   Diagnosis Date Noted  . Cerebral thrombosis with cerebral infarction 07/18/2016  . Acute ischemic stroke ([place])   . Left hemiplegia ([place])   . Stroke-like symptoms 07/17/2016  . Insomnia 01/29/2016  . Depression 01/29/2016  . Alcohol abuse with intoxication ([place]) 10/16/2015  . Psoriasiform dermatitis 07/12/2015  . Dementia due to another medical condition 07/12/2015  . Urinary incontinence 03/14/2015  . Diabetes mellitus, type 2 ([place]) 03/13/2015  . Hemiparesis affecting left side as late effect of cerebrovascular accident ([place]) 02/18/2015  . Cerebrovascular accident (CVA) due to stenosis of right carotid artery ([place])   . Coronary artery disease involving native coronary artery of native heart without angina pectoris   . Tachypnea   . Thrombocytopenia ([place])   . History of stroke   . Stroke (cerebrum) ([place]) 02/06/2015  . Arterial ischemic stroke, MCA (middle cerebral artery), right, acute ([place])   . DOE (dyspnea on exertion) 11/23/2014  . PAF (paroxysmal atrial fibrillation) ([place]) 11/23/2014  . HLD (hyperlipidemia) 11/01/2014  . Tobacco use disorder 11/01/2014  . Diastolic CHF, acute on chronic (HCC) 11/01/2014  . Cerebral infarction due to stenosis of right carotid artery ([place]) 11/01/2014  . Carotid  stenosis 11/01/2014  . Hyperkalemia 10/24/2014  . H/O total knee replacement 10/24/2014  . Essential hypertension   . Polysubstance abuse ([place]) 08/28/2014  . Cocaine abuse ([place])   . Intracranial carotid stenosis   . Headache around the eyes 08/27/2014  . Acute CVA (cerebrovascular accident) ([place]) 08/27/2014  . CVA (cerebral vascular accident) ([place]) 08/26/2014  . History of DVT (deep vein thrombosis)   . Alcoholism ([place])   . Embolic stroke ([place])   . Cerebral infarction due to unspecified mechanism   . Hypotension   . AKI (acute kidney injury) ([place])   .  Stroke ([place])   . CVA (cerebral infarction) 08/01/2014  . Alcohol dependence with withdrawal with complication ([place]) 70/62/3762  . DVT (deep venous thrombosis) ([place]) 02/22/2014  . Lower extremity edema 02/21/2014  . Chronic diastolic congestive heart failure ([place]) 11/21/2013  . CAD (coronary artery disease) 11/21/2013  . Alcohol abuse 11/21/2013  . GERD (gastroesophageal reflux disease) 08/10/2013  . Gout 08/10/2013  . Tobacco abuse 07/26/2013  . Trichiasis without entropion 04/16/2013  . Dizziness 04/10/2013  . Eyelid lesion 01/24/2013  . Enophthalmos due to trauma 01/19/2013  . Medial orbital wall fracture (HCC) 01/19/2013  . Closed blow-out fracture of floor of orbit ([place]) 01/19/2013  . Binocular vision disorder with diplopia 01/03/2013  . Fracture of inferior orbital wall ([place]) 01/03/2013  . Fracture of orbital floor ([place]) 01/03/2013  . Pain of left eye 01/03/2013  . Peripheral vascular disease ([place]) 07/27/2012  . COPD (chronic obstructive pulmonary disease) ([place]) 04/04/2012  . HTN (hypertension) 04/04/2012    Past Surgical History:  Procedure Laterality Date  . abdoninal ao angio & bifem angio  05/27/10   Patent graft, occluded bil stents with no retrograde flow into the hypogastric arteries. 100% occl left ant. tibial artery, 70% to 80% to 100% stenosis right superficial fem artery above adductor canal. 100 % occl right ant. tibial vessell  . Aortogram w/ PTCA  09/292003  . BACK SURGERY    . CARDIAC CATHETERIZATION  05/27/10   severe CAD left cx  . CHOLECYSTECTOMY    . ESOPHAGOGASTRIC FUNDOPLICATION    . EYE SURGERY    . FEMORAL BYPASS  08/19/10   Right Fem-Pop  . IR ANGIO INTRA EXTRACRAN SEL COM CAROTID INNOMINATE BILAT MOD SED  07/20/2016  . IR ANGIO VERTEBRAL SEL SUBCLAVIAN INNOMINATE UNI R MOD SED  07/20/2016  . IR ANGIO VERTEBRAL SEL VERTEBRAL UNI L MOD SED  07/20/2016  . IR RADIOLOGIST EVAL & MGMT  05/27/2016  . JOINT REPLACEMENT    . PERIPHERAL VASCULAR  CATHETERIZATION N/A 12/10/2014   Procedure: Abdominal Aortogram;  Surgeon: Angelia Mould, MD;  Location: [facility];  Service: Cardiovascular;  Laterality: N/A;  . RADIOLOGY WITH ANESTHESIA N/A 02/08/2015   Procedure: RADIOLOGY WITH ANESTHESIA;  Surgeon: Luanne Bras, MD;  Location: [facility];  Service: Radiology;  Laterality: N/A;  . TEE WITHOUT CARDIOVERSION N/A 08/29/2014   Procedure: TRANSESOPHAGEAL ECHOCARDIOGRAM (TEE);  Surgeon: Josue Hector, MD;  Location[facility] ENDOSCOPY;  Service: Cardiovascular;  Laterality: N/A;  . TOTAL KNEE ARTHROPLASTY         Home Medications    Prior to Admission medications   Medication Sig Start Date End Date Taking? Authorizing Provider  ACCU-CHEK SOFTCLIX LANCETS lancets Use 3 times daily before meals 09/17/15   Charlott Rakes, MD  albuterol (PROAIR HFA) 108 (90 Base) MCG/ACT inhaler Inhale 2 puffs into the lungs every 6 (six) hours as needed for wheezing or shortness  of breath. 01/26/17   Charlott Rakes, MD  albuterol (PROVENTIL) (2.5 MG/3ML) 0.083% nebulizer solution Take 3 mLs (2.5 mg total) by nebulization every 6 (six) hours as needed for wheezing or shortness of breath. 03/22/17   Charlott Rakes, MD  allopurinol (ZYLOPRIM) 300 MG tablet Take 1 tablet (300 mg total) by mouth daily. 01/26/17   Charlott Rakes, MD  atorvastatin (LIPITOR) 80 MG tablet Take 1 tablet (80 mg total) by mouth daily at 6 PM. 01/26/17   Charlott Rakes, MD  Blood Glucose Monitoring Suppl (ACCU-CHEK AVIVA) device Use 3 times daily before meals. 03/13/15   Charlott Rakes, MD  carvedilol (COREG) 12.5 MG tablet Take 1 tablet (12.5 mg total) by mouth 2 (two) times daily with a meal. 01/26/17   Charlott Rakes, MD  clopidogrel (PLAVIX) 75 MG tablet Take 1 tablet (75 mg total) by mouth daily. 01/26/17   Charlott Rakes, MD  FLUoxetine (PROZAC) 20 MG capsule TAKE ONE CAPSULE BY MOUTH ONCE DAILY (AM) 10/02/16   [provider]  furosemide (LASIX) 20 MG tablet Take  60 mg by mouth 2 (two) times daily. 02/18/17   [provider]  glucose blood (ACCU-CHEK AVIVA) test strip Use 3 times daily before meals as directed. 01/29/16   Charlott Rakes, MD  hydrALAZINE (APRESOLINE) 50 MG tablet Take 50 mg by mouth daily.  06/18/16   [provider]  lactulose (CHRONULAC) 10 GM/15ML solution Take 15 mLs (10 g total) by mouth 2 (two) times daily as needed for mild constipation. 04/28/16   Charlott Rakes, MD  Lancet Devices [facility]) lancets Use 3 times daily before meals 09/27/15   Charlott Rakes, MD  lisinopril (PRINIVIL,ZESTRIL) 5 MG tablet Take 1 tablet (5 mg total) by mouth daily. Patient not taking: Reported on 12/25/2016 09/22/16   Charlott Rakes, MD  memantine (NAMENDA) 10 MG tablet Take 1 tablet (10 mg total) by mouth 2 (two) times daily. 01/26/17   Charlott Rakes, MD  Misc. Devices (ROLLATOR) MISC 1 each by Does not apply route daily. 02/27/15   Charlott Rakes, MD  pantoprazole (PROTONIX) 40 MG tablet Take 1 tablet (40 mg total) by mouth every morning. 01/26/17   Charlott Rakes, MD  polyethylene glycol (MIRALAX / GLYCOLAX) packet Take 17 g by mouth daily. 07/22/16   Diallo, Earna Coder, MD  predniSONE (STERAPRED UNI-PAK 21 TAB) 10 MG (21) TBPK tablet Take 6 tabs by mouth daily  for 2 days, then 5 tabs for 2 days, then 4 tabs for 2 days, then 3 tabs for 2 days, 2 tabs for 2 days, then 1 tab by mouth daily for 2 days 03/17/17   Dorie Rank, MD  pregabalin (LYRICA) 150 MG capsule Take 1 capsule (150 mg total) by mouth 2 (two) times daily. 04/05/17   Charlott Rakes, MD  Selenium Sulfide 2.25 % SHAM Apply 1 application topically 2 (two) times a week. 01/28/17   Charlott Rakes, MD  tiZANidine (ZANAFLEX) 4 MG tablet Take 1 tablet (4 mg total) by mouth 3 (three) times daily. 01/26/17   Charlott Rakes, MD  traZODone (DESYREL) 50 MG tablet Take 1 tablet (50 mg total) by mouth at bedtime as needed for sleep. 01/26/17   Charlott Rakes, MD  triamcinolone  cream (KENALOG) 0.1 % Apply 1 application topically daily as needed (for dermatitis). 06/02/16   Charlott Rakes, MD  valACYclovir (VALTREX) 1000 MG tablet Take 1 tablet (1,000 mg total) by mouth 2 (two) times daily. 01/26/17   Charlott Rakes, MD    Family History  Family History  Problem Relation Age of Onset  . Heart attack Mother   . Heart failure Mother   . Cirrhosis Father   . Heart failure Brother   . Cancer Brother   . Hypertension Neg Hx        UNKNOWN  . Stroke Neg Hx        UNKNOWN    Social History Social History   Tobacco Use  . Smoking status: Current Every Day Smoker    Packs/day: 0.50    Years: 50.00    Pack years: 25.00    Types: Cigars, Cigarettes    Start date: 02/09/1961  . Smokeless tobacco: Former Systems developer    Types: Chew  . Tobacco comment: 2 ppd, full flavor  Substance Use Topics  . Alcohol use: No  . Drug use: No     Allergies   Darvocet [propoxyphene n-acetaminophen]; Haldol [haloperidol decanoate]; Acetaminophen; Metformin and related; and Norco [hydrocodone-acetaminophen]   Review of Systems Review of Systems  Constitutional: Negative for fever.  Eyes: Negative for redness.  Respiratory: Negative for shortness of breath.   Cardiovascular: Negative for chest pain.  Gastrointestinal: Negative for abdominal pain.  Genitourinary: Negative for dysuria.  Musculoskeletal: Positive for neck pain.  Skin: Negative for rash.  Neurological: Positive for weakness and headaches.  Hematological: Does not bruise/bleed easily.  Psychiatric/Behavioral: Negative for confusion.     Physical Exam Updated Vital Signs BP 134/64 (BP Location: Left Arm)   Pulse 66   Temp 97.6 F (36.4 C) (Oral)   Resp 16   SpO2 95%   Physical Exam  Constitutional: He appears well-developed and well-nourished. No distress.  HENT:  Mouth/Throat: Oropharynx is clear and moist.  Small hematoma to the lateral part of the left head.  No laceration.  Eyes: Conjunctivae and  EOM are normal. Pupils are equal, round, and reactive to light.  Neck:  Neck wrapped in towel.  Cardiovascular: Normal rate, regular rhythm and normal heart sounds.  Pulmonary/Chest: Effort normal and breath sounds normal. No respiratory distress.  Abdominal: Soft. Bowel sounds are normal. There is no tenderness.  Musculoskeletal: He exhibits no deformity.  Neurological: He is alert.  Left-sided weakness left arm greater than leg.  No deformity to the shoulder.  Skin: Skin is warm.  Nursing note and vitals reviewed.    ED Treatments / Results  Labs (all labs ordered are listed, but only abnormal results are displayed) Labs Reviewed  CBC WITH DIFFERENTIAL/PLATELET - Abnormal; Notable for the following components:      Result Value   Platelets 52 (*)    All other components within normal limits  BASIC METABOLIC PANEL - Abnormal; Notable for the following components:   Chloride 112 (*)    Glucose, Bld 102 (*)    All other components within normal limits    EKG  EKG Interpretation  Date/Time:  Tuesday April 13 2017 10:56:23 EST Ventricular Rate:  64 PR Interval:    QRS Duration: 113 QT Interval:  454 QTC Calculation: 469 R Axis:   45 Text Interpretation:  Sinus rhythm Borderline intraventricular conduction delay No significant change since last tracing Confirmed by Fredia Sorrow [id number]) on 04/13/2017 11:00:32 AM       Radiology Dg Chest 2 View  Result Date: 04/13/2017 CLINICAL DATA:  Syncope, fall today. EXAM: CHEST  2 VIEW COMPARISON:  Radiographs of March 17, 2017. FINDINGS: Stable cardiomegaly. No pneumothorax or pleural effusion is noted. No acute pulmonary disease is noted. Bony thorax is unremarkable. IMPRESSION:  No active cardiopulmonary disease. Electronically Signed   By: Marijo Conception, M.D.   On: 04/13/2017 12:59   Ct Head Wo Contrast  Result Date: 04/13/2017 CLINICAL DATA:  Dizziness, fall yesterday. EXAM: CT HEAD WITHOUT CONTRAST CT CERVICAL SPINE WITHOUT  CONTRAST TECHNIQUE: Multidetector CT imaging of the head and cervical spine was performed following the standard protocol without intravenous contrast. Multiplanar CT image reconstructions of the cervical spine were also generated. COMPARISON:  CT scan of August 25, 2016. FINDINGS: CT HEAD FINDINGS Brain: Mild diffuse cortical atrophy is noted. Stable right parietal encephalomalacia consistent with old infarction. Mild chronic ischemic white matter disease is noted. No mass effect or midline shift is noted. Ventricular size is within normal limits. There is no evidence of mass lesion, hemorrhage or acute infarction. Vascular: No hyperdense vessel or unexpected calcification. Skull: Normal. Negative for fracture or focal lesion. Sinuses/Orbits: Mucosal thickening and postsurgical changes are seen involving the left orbital floor and maxillary sinus. Other: Small left parietal scalp hematoma is noted. CT CERVICAL SPINE FINDINGS Alignment: Normal. Skull base and vertebrae: No acute fracture. No primary bone lesion or focal pathologic process. Soft tissues and spinal canal: No prevertebral fluid or swelling. No visible canal hematoma. Disc levels: Fusion of the C2-3 disc space is noted most likely due to degenerative change. Severe degenerative disc disease is noted at C5-6 with anterior osteophyte formation. Anterior osteophyte formation is also noted at C3-4 C4-5 and C5-6. Upper chest: Negative. Other: Degenerative changes seen involving posterior facet joints bilaterally. IMPRESSION: Mild diffuse cortical atrophy. Old right parietal infarction. Mild chronic ischemic white matter disease. Small left parietal scalp hematoma. No acute intracranial abnormality seen. Multilevel degenerative disc disease. No acute abnormality seen in the cervical spine. Electronically Signed   By: Marijo Conception, M.D.   On: 04/13/2017 12:47   Ct Cervical Spine Wo Contrast  Result Date: 04/13/2017 CLINICAL DATA:  Dizziness, fall  yesterday. EXAM: CT HEAD WITHOUT CONTRAST CT CERVICAL SPINE WITHOUT CONTRAST TECHNIQUE: Multidetector CT imaging of the head and cervical spine was performed following the standard protocol without intravenous contrast. Multiplanar CT image reconstructions of the cervical spine were also generated. COMPARISON:  CT scan of August 25, 2016. FINDINGS: CT HEAD FINDINGS Brain: Mild diffuse cortical atrophy is noted. Stable right parietal encephalomalacia consistent with old infarction. Mild chronic ischemic white matter disease is noted. No mass effect or midline shift is noted. Ventricular size is within normal limits. There is no evidence of mass lesion, hemorrhage or acute infarction. Vascular: No hyperdense vessel or unexpected calcification. Skull: Normal. Negative for fracture or focal lesion. Sinuses/Orbits: Mucosal thickening and postsurgical changes are seen involving the left orbital floor and maxillary sinus. Other: Small left parietal scalp hematoma is noted. CT CERVICAL SPINE FINDINGS Alignment: Normal. Skull base and vertebrae: No acute fracture. No primary bone lesion or focal pathologic process. Soft tissues and spinal canal: No prevertebral fluid or swelling. No visible canal hematoma. Disc levels: Fusion of the C2-3 disc space is noted most likely due to degenerative change. Severe degenerative disc disease is noted at C5-6 with anterior osteophyte formation. Anterior osteophyte formation is also noted at C3-4 C4-5 and C5-6. Upper chest: Negative. Other: Degenerative changes seen involving posterior facet joints bilaterally. IMPRESSION: Mild diffuse cortical atrophy. Old right parietal infarction. Mild chronic ischemic white matter disease. Small left parietal scalp hematoma. No acute intracranial abnormality seen. Multilevel degenerative disc disease. No acute abnormality seen in the cervical spine. Electronically Signed   By: Jeneen Rinks  Murlean Caller, M.D.   On: 04/13/2017 12:47    Procedures Procedures  (including critical care time)  Medications Ordered in ED Medications  0.9 %  sodium chloride infusion (not administered)     Initial Impression / Assessment and Plan / ED Course  I have reviewed the triage vital signs and the nursing notes.  Pertinent labs & imaging results that were available during my care of the patient were reviewed by me and considered in my medical decision making (see chart for details).     CT scan head neck without any acute abnormalities.  Patient patient without any orthostatic hypotension here blood pressure is whether even if standing were always above 100.  However patient is on 3 different blood pressure medicines.  I recommended hydralazine be stopped.  Workup for the fall no evidence of any acute injuries.  Labs without significant abnormalities.  Patient stable for discharge home.  And stopping the hydralazine.    Final Clinical Impressions(s) / ED Diagnoses   Final diagnoses:  Fall, initial encounter  Injury of head, initial encounter    ED Discharge Orders    None       Fredia Sorrow, MD 04/13/17 [phone]

## 2017-04-13 NOTE — ED Notes (Signed)
Upon returning from radiology, it was noted that patient had an infiltration of 0.9% NS around his PIV site in the upper right forearm.  Infiltration was noted to be a baseball sized infiltration with blanching skin, edema and pain.  PIV immediately removed, pressure and compression dressing applied to Right upper forearm and antecubital area.  Pt tolerated it well.  Will consult IVT for any further instructions for infiltration.

## 2017-04-14 ENCOUNTER — Telehealth: Payer: Self-pay | Admitting: Family Medicine

## 2017-04-14 DIAGNOSIS — Z9181 History of falling: Secondary | ICD-10-CM | POA: Diagnosis not present

## 2017-04-14 DIAGNOSIS — Z7902 Long term (current) use of antithrombotics/antiplatelets: Secondary | ICD-10-CM | POA: Diagnosis not present

## 2017-04-14 DIAGNOSIS — J449 Chronic obstructive pulmonary disease, unspecified: Secondary | ICD-10-CM | POA: Diagnosis not present

## 2017-04-14 DIAGNOSIS — I110 Hypertensive heart disease with heart failure: Secondary | ICD-10-CM | POA: Diagnosis not present

## 2017-04-14 DIAGNOSIS — I2510 Atherosclerotic heart disease of native coronary artery without angina pectoris: Secondary | ICD-10-CM | POA: Diagnosis not present

## 2017-04-14 DIAGNOSIS — I509 Heart failure, unspecified: Secondary | ICD-10-CM | POA: Diagnosis not present

## 2017-04-14 DIAGNOSIS — I69354 Hemiplegia and hemiparesis following cerebral infarction affecting left non-dominant side: Secondary | ICD-10-CM | POA: Diagnosis not present

## 2017-04-14 DIAGNOSIS — E119 Type 2 diabetes mellitus without complications: Secondary | ICD-10-CM | POA: Diagnosis not present

## 2017-04-14 DIAGNOSIS — I739 Peripheral vascular disease, unspecified: Secondary | ICD-10-CM | POA: Diagnosis not present

## 2017-04-14 NOTE — Telephone Encounter (Signed)
Pt Aid call to see if the pcp can call her back since the Pt has been in the ED for 3 times already and his BP today is 83/54, she want to know if you can change the prescription or what she need to do, please follow up

## 2017-04-14 NOTE — Telephone Encounter (Signed)
I spoke to the patient and he appears to be doing fine; advised to discontinue hydralazine as per ED plan and have his home health aide check his blood pressure and call the clinic back with the numbers

## 2017-04-15 ENCOUNTER — Telehealth: Payer: Self-pay | Admitting: Family Medicine

## 2017-04-15 NOTE — Telephone Encounter (Signed)
Administer the blood pressure pills he was discharged on from the ED.  Discontinue hydralazine as instructed by the ED.

## 2017-04-15 NOTE — Telephone Encounter (Signed)
blood pressure reading to day 149/69. [name] called to get an update on patients plan. [name] stated she is his care nurse and needed to know which blood pressure pills to give patient.

## 2017-04-16 NOTE — Progress Notes (Deleted)
[facility]  PATIENT: [name] DOB: 1950-08-01   REASON FOR VISIT: *** HISTORY FROM:    HISTORY OF PRESENT ILLNESS: 10/19/16 PSMr [name] is a64 year caucasian male seen today for the first office follow-up visit following hospital admission for stroke in June 2018. He is accompanied by his home health aide. I have personally reviewed hospital electronic records, imaging films and stroke workup.[name] a 67 y.o.malewith history of previous strokes, tobacco use, cocaine use, peripheral vascular disease, obstructive sleep apnea, previous right carotid artery stent, coronary artery disease with previous MI, hypertension, hyperlipidemia, history of lower extremity DVT, diabetes mellitus, COPD, congestive heart failure, alcohol abuse, anxiety and depression presented to [facility] on 6/8/18with left upper extremity weakness.since 07/11/16. Hedidnot receive IV t-PA due to late presentation.He describes difficulty moving his left arm and says that he was unable to hold onto anything with his left hand. He states that he did not seek medical attention at that time because he "had other things to do." Today, he noticed worsening of the weakness in his left arm, and now is unable to move it at all. In addition, he has noticed some increasing weakness in his left leg today as well as some slurred speech. His healthcare aide reports that she noticed he was having difficulty walking and not why she had him brought to the emergency department.The patient reports that he is supposed be taking a baby aspirin every day but he does not. He says he only takes when he feels like he needs it which is not very often. He continues to smoke. He tells me that he smokescrack and he last used crack cocaine yesterday. He has a history of 3 prior strokes. The patient was taken for an emergent CT scan of the head which showed evidence of an acute to subacute right frontal lobe infarction  without hemorrhage. He is not a candidate for thrombolytic therapy due to the fact that he is way outside the window and artery has evidence of maturing infarct on CT. Code stroke was therefore canceled and he will be admitted for routine stroke evaluation.Last known well: 07/11/16 TPA given : No outside time window.NIHSS score: 9. MRI scan of the brain showed patchy multifocal acute ischemic right MCA branch infarcts involving predominantly posterior right frontal cortex with pre-and postcentral gyri. There are small petechial hemorrhage. There are a few more scattered superimposed subacute infarcts in that region as well. MRA of the brain showed absent flow void in the right cavernous/supraclinoid stent with distal reconstitution at the right ICA terminus. Cerebral catheter angiogram performed on 07/20/16 showed 50% into stent stenosis in the right cavernous ICA and 20% stenosis distal to the proximal right ICA stent. The 50% stenosis at origin of the innominate artery and 40-50% stenosis of the left ICA and the supraclinoid segments. Transthoracic echo showed normal ejection fraction without cardiac source of embolism. LDL cholesterol was elevated 95 mg percent and hemoglobin A1c was borderline at 5.8. Patient was started on aspirin and Plavix as well as Lipitor and transferred to skilled nursing facility. He is made gradual progress and is currently living at home with home health aides which provide helpful 80 hours per week. Patient still has significant weakness in his left upper extremity and is barely able to bend his fingers and use it. He has cut back smoking significantly from 2 pack per day now to half pack per day and is trying to quit completely. He has been on aspirin  and Plavix but does complain of increase bruising. He is currently getting home physical therapy but for some reason occupational therapy has been stopped. He does complain of increased pain and stiffness in his left side including  involuntary tremors probably clonus. He is able to walk with a walker or a cane but does need a one-person assist to do so. She spends most of his time in a wheelchair. He has applied to get a powered wheelchair and has an assessment for the same pending later this week. Patient has a prescription for Zanaflex was previous chronic headaches but has not been taking it regularly.  REVIEW OF SYSTEMS: Full 14 system review of systems performed and notable only for those listed, all others are neg:  Constitutional: neg  Cardiovascular: neg Ear/Nose/Throat: neg  Skin: neg Eyes: neg Respiratory: neg Gastroitestinal: neg  Hematology/Lymphatic: neg  Endocrine: neg Musculoskeletal:neg Allergy/Immunology: neg Neurological: neg Psychiatric: neg Sleep : neg   ALLERGIES: Allergies  Allergen Reactions  . Darvocet [Propoxyphene N-Acetaminophen] Hives  . Haldol [Haloperidol Decanoate] Hives  . Acetaminophen Nausea Only    Upset stomach, tolerates Hydrocodone/APAP if taken with food  . Metformin And Related Nausea And Vomiting  . Norco [Hydrocodone-Acetaminophen] Nausea And Vomiting    Tolerates if taken with food    HOME MEDICATIONS: Outpatient Medications Prior to Visit  Medication Sig Dispense Refill  . ACCU-CHEK SOFTCLIX LANCETS lancets Use 3 times daily before meals 100 each 5  . albuterol (PROAIR HFA) 108 (90 Base) MCG/ACT inhaler Inhale 2 puffs into the lungs every 6 (six) hours as needed for wheezing or shortness of breath. 8.5 g 1  . albuterol (PROVENTIL) (2.5 MG/3ML) 0.083% nebulizer solution Take 3 mLs (2.5 mg total) by nebulization every 6 (six) hours as needed for wheezing or shortness of breath. 75 mL 0  . allopurinol (ZYLOPRIM) 300 MG tablet Take 1 tablet (300 mg total) by mouth daily. 30 tablet 3  . atorvastatin (LIPITOR) 80 MG tablet Take 1 tablet (80 mg total) by mouth daily at 6 PM. 30 tablet 3  . Blood Glucose Monitoring Suppl (ACCU-CHEK AVIVA) device Use 3 times daily before  meals. 1 each 0  . carvedilol (COREG) 12.5 MG tablet Take 1 tablet (12.5 mg total) by mouth 2 (two) times daily with a meal. 60 tablet 3  . clopidogrel (PLAVIX) 75 MG tablet Take 1 tablet (75 mg total) by mouth daily. 30 tablet 3  . FLUoxetine (PROZAC) 20 MG capsule TAKE ONE CAPSULE BY MOUTH ONCE DAILY (AM)  11  . furosemide (LASIX) 20 MG tablet Take 60 mg by mouth 2 (two) times daily.  11  . glucose blood (ACCU-CHEK AVIVA) test strip Use 3 times daily before meals as directed. 100 each 12  . hydrALAZINE (APRESOLINE) 50 MG tablet Take 50 mg by mouth daily.   11  . lactulose (CHRONULAC) 10 GM/15ML solution Take 15 mLs (10 g total) by mouth 2 (two) times daily as needed for mild constipation. 946 mL 2  . Lancet Devices (ACCU-CHEK SOFTCLIX) lancets Use 3 times daily before meals 1 each 5  . lisinopril (PRINIVIL,ZESTRIL) 5 MG tablet Take 1 tablet (5 mg total) by mouth daily. (Patient not taking: Reported on 12/25/2016) 30 tablet 5  . memantine (NAMENDA) 10 MG tablet Take 1 tablet (10 mg total) by mouth 2 (two) times daily. 60 tablet 3  . Misc. Devices (ROLLATOR) MISC 1 each by Does not apply route daily. 1 each 0  . pantoprazole (PROTONIX) 40 MG tablet  Take 1 tablet (40 mg total) by mouth every morning. 30 tablet 3  . polyethylene glycol (MIRALAX / GLYCOLAX) packet Take 17 g by mouth daily. 14 each 0  . predniSONE (STERAPRED UNI-PAK 21 TAB) 10 MG (21) TBPK tablet Take 6 tabs by mouth daily  for 2 days, then 5 tabs for 2 days, then 4 tabs for 2 days, then 3 tabs for 2 days, 2 tabs for 2 days, then 1 tab by mouth daily for 2 days 42 tablet 0  . pregabalin (LYRICA) 150 MG capsule Take 1 capsule (150 mg total) by mouth 2 (two) times daily. 60 capsule 3  . Selenium Sulfide 2.25 % SHAM Apply 1 application topically 2 (two) times a week. 180 mL 1  . tiZANidine (ZANAFLEX) 4 MG tablet Take 1 tablet (4 mg total) by mouth 3 (three) times daily. 90 tablet 3  . traZODone (DESYREL) 50 MG tablet Take 1 tablet (50 mg  total) by mouth at bedtime as needed for sleep. 30 tablet 3  . triamcinolone cream (KENALOG) 0.1 % Apply 1 application topically daily as needed (for dermatitis). 30 g 0  . valACYclovir (VALTREX) 1000 MG tablet Take 1 tablet (1,000 mg total) by mouth 2 (two) times daily. 20 tablet 0   No facility-administered medications prior to visit.     PAST MEDICAL HISTORY: Past Medical History:  Diagnosis Date  . Alcohol abuse    H/O  . Anxiety   . Arthritis   . Asthma   . CAD (coronary artery disease) 05/27/10   Cath: severe single vessell CAD left cx midportion obtuse marginal 2 to 3.  [name] CHF (congestive heart failure) ([place])   . COPD (chronic obstructive pulmonary disease) ([place])   . COPD (chronic obstructive pulmonary disease) ([place])   . Depression   . Diabetes mellitus, type 2 ([place]) 03/13/2015  . GERD (gastroesophageal reflux disease)   . HCAP (healthcare-associated pneumonia) 10/15/2015  . History of DVT of lower extremity   . Hyperlipemia   . Hyperlipidemia   . Hypertension   . Myocardial infarction ([place]) 2000  . Orbital fracture ([place]) 12/2012  . OSA on CPAP   . Peripheral vascular disease, unspecified ([place])    08/20/10 doppler: increase in right ABI post-op. Left ABI stable. S/P bi-fem bypass surgery  . Pneumonia 04/04/2012  . Shortness of breath   . Stroke ([place])   . Tobacco abuse     PAST SURGICAL HISTORY: Past Surgical History:  Procedure Laterality Date  . abdoninal ao angio & bifem angio  05/27/10   Patent graft, occluded bil stents with no retrograde flow into the hypogastric arteries. 100% occl left ant. tibial artery, 70% to 80% to 100% stenosis right superficial fem artery above adductor canal. 100 % occl right ant. tibial vessell  . Aortogram w/ PTCA  09/292003  . BACK SURGERY    . CARDIAC CATHETERIZATION  05/27/10   severe CAD left cx  . CHOLECYSTECTOMY    . ESOPHAGOGASTRIC FUNDOPLICATION    . EYE SURGERY    . FEMORAL BYPASS  08/19/10   Right Fem-Pop  . IR ANGIO INTRA  EXTRACRAN SEL COM CAROTID INNOMINATE BILAT MOD SED  07/20/2016  . IR ANGIO VERTEBRAL SEL SUBCLAVIAN INNOMINATE UNI R MOD SED  07/20/2016  . IR ANGIO VERTEBRAL SEL VERTEBRAL UNI L MOD SED  07/20/2016  . IR RADIOLOGIST EVAL & MGMT  05/27/2016  . JOINT REPLACEMENT    . PERIPHERAL VASCULAR CATHETERIZATION N/A 12/10/2014   Procedure: Abdominal Aortogram;  Surgeon: Angelia Mould, MD;  Location: [facility];  Service: Cardiovascular;  Laterality: N/A;  . RADIOLOGY WITH ANESTHESIA N/A 02/08/2015   Procedure: RADIOLOGY WITH ANESTHESIA;  Surgeon: Luanne Bras, MD;  Location: [facility];  Service: Radiology;  Laterality: N/A;  . TEE WITHOUT CARDIOVERSION N/A 08/29/2014   Procedure: TRANSESOPHAGEAL ECHOCARDIOGRAM (TEE);  Surgeon: Josue Hector, MD;  [facility];  Service: Cardiovascular;  Laterality: N/A;  . TOTAL KNEE ARTHROPLASTY      FAMILY HISTORY: Family History  Problem Relation Age of Onset  . Heart attack Mother   . Heart failure Mother   . Cirrhosis Father   . Heart failure Brother   . Cancer Brother   . Hypertension Neg Hx        UNKNOWN  . Stroke Neg Hx        UNKNOWN    SOCIAL HISTORY: Social History   Socioeconomic History  . Marital status: Single    Spouse name: Not on file  . Number of children: Not on file  . Years of education: Not on file  . Highest education level: Not on file  Social Needs  . Financial resource strain: Not on file  . Food insecurity - worry: Not on file  . Food insecurity - inability: Not on file  . Transportation needs - medical: Not on file  . Transportation needs - non-medical: Not on file  Occupational History  . Not on file  Tobacco Use  . Smoking status: Current Every Day Smoker    Packs/day: 0.50    Years: 50.00    Pack years: 25.00    Types: Cigars, Cigarettes    Start date: 02/09/1961  . Smokeless tobacco: Former Systems developer    Types: Chew  . Tobacco comment: 2 ppd, full flavor  Substance and Sexual Activity  .  Alcohol use: No  . Drug use: No  . Sexual activity: Yes  Other Topics Concern  . Not on file  Social History Narrative   Recovering alcoholic     PHYSICAL EXAM  There were no vitals filed for this visit. There is no height or weight on file to calculate BMI.  Generalized: Well developed, in no acute distress  Head: normocephalic and atraumatic,. Oropharynx benign  Neck: Supple, no carotid bruits  Cardiac: Regular rate rhythm, no murmur  Musculoskeletal: No deformity   Neurological examination   Mentation: Alert oriented to time, place, history taking. Attention span and concentration appropriate. Recent and remote memory intact.  Follows all commands speech and language fluent.   Cranial nerve II-XII: Fundoscopic exam reveals sharp disc margins.Pupils were equal round reactive to light extraocular movements were full, visual field were full on confrontational test. Facial sensation and strength were normal. hearing was intact to finger rubbing bilaterally. Uvula tongue midline. head turning and shoulder shrug were normal and symmetric.Tongue protrusion into cheek strength was normal. Motor: normal bulk and tone, full strength in the BUE, BLE, fine finger movements normal, no pronator drift. No focal weakness Sensory: normal and symmetric to light touch, pinprick, and  Vibration, proprioception  Coordination: finger-nose-finger, heel-to-shin bilaterally, no dysmetria Reflexes: Brachioradialis 2/2, biceps 2/2, triceps 2/2, patellar 2/2, Achilles 2/2, plantar responses were flexor bilaterally. Gait and Station: Rising up from seated position without assistance, normal stance,  moderate stride, good arm swing, smooth turning, able to perform tiptoe, and heel walking without difficulty. Tandem gait is steady  DIAGNOSTIC DATA (LABS, IMAGING, TESTING) - I reviewed patient records, labs, notes, testing and  imaging myself where available.  Lab Results  Component Value Date   WBC 5.6  04/13/2017   HGB 13.9 04/13/2017   HCT 42.0 04/13/2017   MCV 98.4 04/13/2017   PLT 52 (L) 04/13/2017      Component Value Date/Time   NA 144 04/13/2017 1126   NA 146 (H) 05/04/2016 1350   K 4.5 04/13/2017 1126   CL 112 (H) 04/13/2017 1126   CO2 23 04/13/2017 1126   GLUCOSE 102 (H) 04/13/2017 1126   BUN 12 04/13/2017 1126   BUN 10 05/04/2016 1350   CREATININE 0.91 04/13/2017 1126   CREATININE CANCELED 04/28/2016 1515   CALCIUM 9.1 04/13/2017 1126   PROT 6.6 03/17/2017 1334   PROT 5.5 (L) 05/04/2016 1350   ALBUMIN 3.8 03/17/2017 1334   ALBUMIN 3.6 05/04/2016 1350   AST 22 03/17/2017 1334   ALT 18 03/17/2017 1334   ALKPHOS 65 03/17/2017 1334   BILITOT 0.8 03/17/2017 1334   BILITOT 0.3 05/04/2016 1350   GFRNONAA >60 04/13/2017 1126   GFRNONAA CANCELED 04/28/2016 1515   GFRAA >60 04/13/2017 1126   GFRAA CANCELED 04/28/2016 1515   Lab Results  Component Value Date   CHOL 155 07/18/2016   HDL 36 (L) 07/18/2016   LDLCALC 95 07/18/2016   TRIG 121 07/18/2016   CHOLHDL 4.3 07/18/2016   Lab Results  Component Value Date   HGBA1C 6.2 12/25/2016   Lab Results  Component Value Date   SEGBTDVV61 607 01/26/2008   Lab Results  Component Value Date   TSH 0.474 10/15/2015    ***  ASSESSMENT AND PLAN   29 year Caucasian male with subacute and acute right frontal MCA branch infarcts in June 2018 secondary to right cavernous carotid occlusion and distal recannulization due to large vessel disease. Multiple vascular risk factors of previous strokes, tobacco abuse, cocaine abuse, peripheral vascular disease, obstructive sleep apnea, previous right carotid artery stent, coronary artery disease with previous MI, hypertension, hyperlipidemia, diabetes, smoking,alcohol abuse and congestive heart failure    PLAN: I had a long d/w patient about his recent stroke,carotid stenosis risk for recurrent stroke/TIAs, personally independently reviewed imaging studies and stroke evaluation  results and answered questions.Continue Plavix 75 mg daily but discontinue aspirin due to bruising for secondary stroke prevention and maintain strict control of hypertension with blood pressure goal below 130/90, diabetes with hemoglobin A1c goal below 6.5% and lipids with LDL cholesterol goal below 70 mg/dL.  I have complimented him on cutting back smoking and advise him to quit it completely. I recommend home physical and occupational therapy and start Zanaflex 2 mg 3 times daily to help with his post stroke spasticity and pain in the left arm. Followup in the future with my nurse practitioner in 6 months or call earlier if necessary Greater than 50% of time during this 25 minute visit was spent on counseling,explanation of diagnosis of strokes, occlusive cerebrovascular disease planning of further management, discussion with patient and family and coordination of care  Dennie Bible, [facility], [facility], APRN  [facility] [address] [place], [place] [address] [phone]

## 2017-04-16 NOTE — Telephone Encounter (Signed)
Called and spoke with patient yesterday evening trying to get in touch with lynn due to her forgetting her phone and patient answered and stated she had left for the day. Attempted to call again to reach lynn and no answer. LVM to return call.

## 2017-04-19 ENCOUNTER — Encounter: Payer: Self-pay | Admitting: Nurse Practitioner

## 2017-04-19 ENCOUNTER — Telehealth: Payer: Self-pay | Admitting: *Deleted

## 2017-04-19 ENCOUNTER — Ambulatory Visit: Payer: Medicare Other | Admitting: Nurse Practitioner

## 2017-04-19 NOTE — Telephone Encounter (Signed)
Patient was no show for FU with NP today.

## 2017-04-20 DIAGNOSIS — J449 Chronic obstructive pulmonary disease, unspecified: Secondary | ICD-10-CM | POA: Diagnosis not present

## 2017-04-20 DIAGNOSIS — E119 Type 2 diabetes mellitus without complications: Secondary | ICD-10-CM | POA: Diagnosis not present

## 2017-04-20 DIAGNOSIS — I739 Peripheral vascular disease, unspecified: Secondary | ICD-10-CM | POA: Diagnosis not present

## 2017-04-20 DIAGNOSIS — Z9181 History of falling: Secondary | ICD-10-CM | POA: Diagnosis not present

## 2017-04-20 DIAGNOSIS — I2510 Atherosclerotic heart disease of native coronary artery without angina pectoris: Secondary | ICD-10-CM | POA: Diagnosis not present

## 2017-04-20 DIAGNOSIS — Z7902 Long term (current) use of antithrombotics/antiplatelets: Secondary | ICD-10-CM | POA: Diagnosis not present

## 2017-04-20 DIAGNOSIS — I509 Heart failure, unspecified: Secondary | ICD-10-CM | POA: Diagnosis not present

## 2017-04-20 DIAGNOSIS — I110 Hypertensive heart disease with heart failure: Secondary | ICD-10-CM | POA: Diagnosis not present

## 2017-04-20 DIAGNOSIS — I69354 Hemiplegia and hemiparesis following cerebral infarction affecting left non-dominant side: Secondary | ICD-10-CM | POA: Diagnosis not present

## 2017-04-21 ENCOUNTER — Telehealth: Payer: Self-pay | Admitting: Family Medicine

## 2017-04-21 DIAGNOSIS — Z9181 History of falling: Secondary | ICD-10-CM | POA: Diagnosis not present

## 2017-04-21 DIAGNOSIS — I739 Peripheral vascular disease, unspecified: Secondary | ICD-10-CM | POA: Diagnosis not present

## 2017-04-21 DIAGNOSIS — I69354 Hemiplegia and hemiparesis following cerebral infarction affecting left non-dominant side: Secondary | ICD-10-CM | POA: Diagnosis not present

## 2017-04-21 DIAGNOSIS — I2510 Atherosclerotic heart disease of native coronary artery without angina pectoris: Secondary | ICD-10-CM | POA: Diagnosis not present

## 2017-04-21 DIAGNOSIS — E119 Type 2 diabetes mellitus without complications: Secondary | ICD-10-CM | POA: Diagnosis not present

## 2017-04-21 DIAGNOSIS — Z7902 Long term (current) use of antithrombotics/antiplatelets: Secondary | ICD-10-CM | POA: Diagnosis not present

## 2017-04-21 DIAGNOSIS — I110 Hypertensive heart disease with heart failure: Secondary | ICD-10-CM | POA: Diagnosis not present

## 2017-04-21 DIAGNOSIS — J449 Chronic obstructive pulmonary disease, unspecified: Secondary | ICD-10-CM | POA: Diagnosis not present

## 2017-04-21 DIAGNOSIS — I509 Heart failure, unspecified: Secondary | ICD-10-CM | POA: Diagnosis not present

## 2017-04-21 NOTE — Telephone Encounter (Signed)
Nurse Sharyn Lull from [facility] called to request verbal orders for Occupational therapy Twice a wk for 4 wks Once a wk for 1wk Please follow up :[phone]

## 2017-04-21 NOTE — Telephone Encounter (Signed)
Nurse Laurence Slate from [facility] called to speak with the nurse about pt's nurse visits which have been reverified due to pt having many frequent falls.she also wants to inform you of the patient having upper back soreness on a red spot which could be a possible black head or infection. Please follow up

## 2017-04-21 NOTE — Telephone Encounter (Signed)
Sharyn Lull was called and given the verbal orders for OT.

## 2017-04-26 DIAGNOSIS — J449 Chronic obstructive pulmonary disease, unspecified: Secondary | ICD-10-CM | POA: Diagnosis not present

## 2017-04-26 DIAGNOSIS — I2510 Atherosclerotic heart disease of native coronary artery without angina pectoris: Secondary | ICD-10-CM | POA: Diagnosis not present

## 2017-04-26 DIAGNOSIS — Z9181 History of falling: Secondary | ICD-10-CM | POA: Diagnosis not present

## 2017-04-26 DIAGNOSIS — I110 Hypertensive heart disease with heart failure: Secondary | ICD-10-CM | POA: Diagnosis not present

## 2017-04-26 DIAGNOSIS — I69354 Hemiplegia and hemiparesis following cerebral infarction affecting left non-dominant side: Secondary | ICD-10-CM | POA: Diagnosis not present

## 2017-04-26 DIAGNOSIS — I509 Heart failure, unspecified: Secondary | ICD-10-CM | POA: Diagnosis not present

## 2017-04-26 DIAGNOSIS — I739 Peripheral vascular disease, unspecified: Secondary | ICD-10-CM | POA: Diagnosis not present

## 2017-04-26 DIAGNOSIS — Z7902 Long term (current) use of antithrombotics/antiplatelets: Secondary | ICD-10-CM | POA: Diagnosis not present

## 2017-04-26 DIAGNOSIS — E119 Type 2 diabetes mellitus without complications: Secondary | ICD-10-CM | POA: Diagnosis not present

## 2017-04-29 ENCOUNTER — Telehealth: Payer: Self-pay | Admitting: Family Medicine

## 2017-04-29 DIAGNOSIS — J449 Chronic obstructive pulmonary disease, unspecified: Secondary | ICD-10-CM | POA: Diagnosis not present

## 2017-04-29 DIAGNOSIS — I509 Heart failure, unspecified: Secondary | ICD-10-CM | POA: Diagnosis not present

## 2017-04-29 DIAGNOSIS — E119 Type 2 diabetes mellitus without complications: Secondary | ICD-10-CM | POA: Diagnosis not present

## 2017-04-29 DIAGNOSIS — Z7902 Long term (current) use of antithrombotics/antiplatelets: Secondary | ICD-10-CM | POA: Diagnosis not present

## 2017-04-29 DIAGNOSIS — I739 Peripheral vascular disease, unspecified: Secondary | ICD-10-CM | POA: Diagnosis not present

## 2017-04-29 DIAGNOSIS — I110 Hypertensive heart disease with heart failure: Secondary | ICD-10-CM | POA: Diagnosis not present

## 2017-04-29 DIAGNOSIS — I2510 Atherosclerotic heart disease of native coronary artery without angina pectoris: Secondary | ICD-10-CM | POA: Diagnosis not present

## 2017-04-29 DIAGNOSIS — Z9181 History of falling: Secondary | ICD-10-CM | POA: Diagnosis not present

## 2017-04-29 DIAGNOSIS — I69354 Hemiplegia and hemiparesis following cerebral infarction affecting left non-dominant side: Secondary | ICD-10-CM | POA: Diagnosis not present

## 2017-04-29 NOTE — Telephone Encounter (Signed)
Call was placed to [facility] and she informed that hse would submit all paperwork to social worker.

## 2017-04-29 NOTE — Telephone Encounter (Signed)
Could you please inquire if the therapist could get their social worker to go assess [name] for possible placement in a facility if he is willing given frequent falls?  Thank you

## 2017-04-29 NOTE — Telephone Encounter (Signed)
Occupational therapist Sharyn Lull from [facility] called to inform PCP that Linwood had a recent fall on Monday 04-26-2017. If you have any questions or concerns don't hesitate to give her a call back at [phone]

## 2017-04-29 NOTE — Telephone Encounter (Signed)
Will route to PCP 

## 2017-04-30 ENCOUNTER — Telehealth: Payer: Self-pay | Admitting: Family Medicine

## 2017-04-30 NOTE — Telephone Encounter (Signed)
Received a fax from [facility] orders, fax will in the pcp in-box

## 2017-05-03 ENCOUNTER — Telehealth: Payer: Self-pay | Admitting: Family Medicine

## 2017-05-03 DIAGNOSIS — I110 Hypertensive heart disease with heart failure: Secondary | ICD-10-CM | POA: Diagnosis not present

## 2017-05-03 DIAGNOSIS — I509 Heart failure, unspecified: Secondary | ICD-10-CM | POA: Diagnosis not present

## 2017-05-03 DIAGNOSIS — E119 Type 2 diabetes mellitus without complications: Secondary | ICD-10-CM | POA: Diagnosis not present

## 2017-05-03 DIAGNOSIS — J449 Chronic obstructive pulmonary disease, unspecified: Secondary | ICD-10-CM | POA: Diagnosis not present

## 2017-05-03 DIAGNOSIS — Z9181 History of falling: Secondary | ICD-10-CM | POA: Diagnosis not present

## 2017-05-03 DIAGNOSIS — I739 Peripheral vascular disease, unspecified: Secondary | ICD-10-CM | POA: Diagnosis not present

## 2017-05-03 DIAGNOSIS — Z7902 Long term (current) use of antithrombotics/antiplatelets: Secondary | ICD-10-CM | POA: Diagnosis not present

## 2017-05-03 DIAGNOSIS — I69354 Hemiplegia and hemiparesis following cerebral infarction affecting left non-dominant side: Secondary | ICD-10-CM | POA: Diagnosis not present

## 2017-05-03 DIAGNOSIS — I2510 Atherosclerotic heart disease of native coronary artery without angina pectoris: Secondary | ICD-10-CM | POA: Diagnosis not present

## 2017-05-03 MED ORDER — ALBUTEROL SULFATE (2.5 MG/3ML) 0.083% IN NEBU: 2.5000 mg | INHALATION_SOLUTION | Freq: Four times a day (QID) | RESPIRATORY_TRACT | 0 refills | Status: DC | PRN

## 2017-05-03 MED ORDER — ALBUTEROL SULFATE HFA 108 (90 BASE) MCG/ACT IN AERS: INHALATION_SPRAY | RESPIRATORY_TRACT | 1 refills | Status: DC

## 2017-05-03 NOTE — Telephone Encounter (Signed)
Patient nurse [name] called and requested for listed medication to be refilled.  albuterol (PROVENTIL) (2.5 MG/3ML) 0.083% nebulizer solution [[id number]] patients nurse requested for a larger quanity due to only recieveing 5 at a time.  albuterol [facility] HFA) 108 [phone] Base) MCG/ACT inhaler [[id number]]  [facility] [address] [place], [place] [address] Please preauthorize the medications before hand.

## 2017-05-03 NOTE — Telephone Encounter (Signed)
Refilled - it must be an insurance issue to only received 5 nebs but I refilled as 25. I cannot do prior auths without information from the pharmacy so they will send me that information if a prior auth is needed.

## 2017-05-05 DIAGNOSIS — Z7902 Long term (current) use of antithrombotics/antiplatelets: Secondary | ICD-10-CM | POA: Diagnosis not present

## 2017-05-05 DIAGNOSIS — I2510 Atherosclerotic heart disease of native coronary artery without angina pectoris: Secondary | ICD-10-CM | POA: Diagnosis not present

## 2017-05-05 DIAGNOSIS — Z9181 History of falling: Secondary | ICD-10-CM | POA: Diagnosis not present

## 2017-05-05 DIAGNOSIS — I739 Peripheral vascular disease, unspecified: Secondary | ICD-10-CM | POA: Diagnosis not present

## 2017-05-05 DIAGNOSIS — E119 Type 2 diabetes mellitus without complications: Secondary | ICD-10-CM | POA: Diagnosis not present

## 2017-05-05 DIAGNOSIS — I110 Hypertensive heart disease with heart failure: Secondary | ICD-10-CM | POA: Diagnosis not present

## 2017-05-05 DIAGNOSIS — I509 Heart failure, unspecified: Secondary | ICD-10-CM | POA: Diagnosis not present

## 2017-05-05 DIAGNOSIS — I69354 Hemiplegia and hemiparesis following cerebral infarction affecting left non-dominant side: Secondary | ICD-10-CM | POA: Diagnosis not present

## 2017-05-05 DIAGNOSIS — J449 Chronic obstructive pulmonary disease, unspecified: Secondary | ICD-10-CM | POA: Diagnosis not present

## 2017-05-06 ENCOUNTER — Telehealth: Payer: Self-pay | Admitting: Family Medicine

## 2017-05-06 NOTE — Telephone Encounter (Signed)
4 page, paperwork received through fax 05-06-17.

## 2017-05-07 ENCOUNTER — Telehealth: Payer: Self-pay | Admitting: Family Medicine

## 2017-05-07 DIAGNOSIS — I739 Peripheral vascular disease, unspecified: Secondary | ICD-10-CM | POA: Diagnosis not present

## 2017-05-07 DIAGNOSIS — J449 Chronic obstructive pulmonary disease, unspecified: Secondary | ICD-10-CM | POA: Diagnosis not present

## 2017-05-07 DIAGNOSIS — Z9181 History of falling: Secondary | ICD-10-CM | POA: Diagnosis not present

## 2017-05-07 DIAGNOSIS — I509 Heart failure, unspecified: Secondary | ICD-10-CM | POA: Diagnosis not present

## 2017-05-07 DIAGNOSIS — I69354 Hemiplegia and hemiparesis following cerebral infarction affecting left non-dominant side: Secondary | ICD-10-CM | POA: Diagnosis not present

## 2017-05-07 DIAGNOSIS — E119 Type 2 diabetes mellitus without complications: Secondary | ICD-10-CM | POA: Diagnosis not present

## 2017-05-07 DIAGNOSIS — I110 Hypertensive heart disease with heart failure: Secondary | ICD-10-CM | POA: Diagnosis not present

## 2017-05-07 DIAGNOSIS — I2510 Atherosclerotic heart disease of native coronary artery without angina pectoris: Secondary | ICD-10-CM | POA: Diagnosis not present

## 2017-05-07 DIAGNOSIS — Z7902 Long term (current) use of antithrombotics/antiplatelets: Secondary | ICD-10-CM | POA: Diagnosis not present

## 2017-05-07 NOTE — Telephone Encounter (Signed)
4 page, paperwork recieved through fax 05-07-17.

## 2017-05-10 ENCOUNTER — Telehealth: Payer: Self-pay | Admitting: Family Medicine

## 2017-05-10 NOTE — Telephone Encounter (Signed)
Abby from [facility] called and stated they needed an updated medlist for the medicare billing. Please fu at your earliest convenience. Fax : [phone]

## 2017-05-10 NOTE — Telephone Encounter (Signed)
Faxed

## 2017-05-12 DIAGNOSIS — I110 Hypertensive heart disease with heart failure: Secondary | ICD-10-CM | POA: Diagnosis not present

## 2017-05-12 DIAGNOSIS — I2510 Atherosclerotic heart disease of native coronary artery without angina pectoris: Secondary | ICD-10-CM | POA: Diagnosis not present

## 2017-05-12 DIAGNOSIS — I509 Heart failure, unspecified: Secondary | ICD-10-CM | POA: Diagnosis not present

## 2017-05-12 DIAGNOSIS — Z9181 History of falling: Secondary | ICD-10-CM | POA: Diagnosis not present

## 2017-05-12 DIAGNOSIS — I69354 Hemiplegia and hemiparesis following cerebral infarction affecting left non-dominant side: Secondary | ICD-10-CM | POA: Diagnosis not present

## 2017-05-12 DIAGNOSIS — I739 Peripheral vascular disease, unspecified: Secondary | ICD-10-CM | POA: Diagnosis not present

## 2017-05-12 DIAGNOSIS — J449 Chronic obstructive pulmonary disease, unspecified: Secondary | ICD-10-CM | POA: Diagnosis not present

## 2017-05-12 DIAGNOSIS — E119 Type 2 diabetes mellitus without complications: Secondary | ICD-10-CM | POA: Diagnosis not present

## 2017-05-12 DIAGNOSIS — Z7902 Long term (current) use of antithrombotics/antiplatelets: Secondary | ICD-10-CM | POA: Diagnosis not present

## 2017-05-13 ENCOUNTER — Telehealth: Payer: Self-pay

## 2017-05-13 NOTE — Telephone Encounter (Signed)
[name] from occupational therapy with [facility] wanted to have verbal orders   CMA gave the verbal order to [facility]

## 2017-05-13 NOTE — Telephone Encounter (Signed)
[name] from [facility] called to let know that she went to the patient home yesterday evening and he was not there and went back this morning  and he was still not there she was wondering probably his at the hospital   Laurence Slate stated that she going back tomorrow

## 2017-05-14 ENCOUNTER — Telehealth: Payer: Self-pay

## 2017-05-14 ENCOUNTER — Ambulatory Visit: Payer: Self-pay | Admitting: Family Medicine

## 2017-05-14 ENCOUNTER — Telehealth: Payer: Self-pay | Admitting: Family Medicine

## 2017-05-14 DIAGNOSIS — E119 Type 2 diabetes mellitus without complications: Secondary | ICD-10-CM | POA: Diagnosis not present

## 2017-05-14 DIAGNOSIS — I509 Heart failure, unspecified: Secondary | ICD-10-CM | POA: Diagnosis not present

## 2017-05-14 DIAGNOSIS — I2510 Atherosclerotic heart disease of native coronary artery without angina pectoris: Secondary | ICD-10-CM | POA: Diagnosis not present

## 2017-05-14 DIAGNOSIS — J449 Chronic obstructive pulmonary disease, unspecified: Secondary | ICD-10-CM | POA: Diagnosis not present

## 2017-05-14 DIAGNOSIS — Z7902 Long term (current) use of antithrombotics/antiplatelets: Secondary | ICD-10-CM | POA: Diagnosis not present

## 2017-05-14 DIAGNOSIS — I110 Hypertensive heart disease with heart failure: Secondary | ICD-10-CM | POA: Diagnosis not present

## 2017-05-14 DIAGNOSIS — I69354 Hemiplegia and hemiparesis following cerebral infarction affecting left non-dominant side: Secondary | ICD-10-CM | POA: Diagnosis not present

## 2017-05-14 DIAGNOSIS — I739 Peripheral vascular disease, unspecified: Secondary | ICD-10-CM | POA: Diagnosis not present

## 2017-05-14 DIAGNOSIS — Z9181 History of falling: Secondary | ICD-10-CM | POA: Diagnosis not present

## 2017-05-14 NOTE — Telephone Encounter (Signed)
[name]  is calling form [facility] stating that she is going to go and do a visit and her question was since she have not find him at his home if she can extended his visit   Per Alycia his CMA was ok for me to give verbal order to do the extension

## 2017-05-14 NOTE — Telephone Encounter (Signed)
2 page, paperwork received through fax 05-14-17.

## 2017-05-14 NOTE — Telephone Encounter (Signed)
Will route to PCP 

## 2017-05-17 ENCOUNTER — Telehealth: Payer: Self-pay | Admitting: Family Medicine

## 2017-05-17 DIAGNOSIS — I739 Peripheral vascular disease, unspecified: Secondary | ICD-10-CM | POA: Diagnosis not present

## 2017-05-17 DIAGNOSIS — J449 Chronic obstructive pulmonary disease, unspecified: Secondary | ICD-10-CM | POA: Diagnosis not present

## 2017-05-17 DIAGNOSIS — I110 Hypertensive heart disease with heart failure: Secondary | ICD-10-CM | POA: Diagnosis not present

## 2017-05-17 DIAGNOSIS — E119 Type 2 diabetes mellitus without complications: Secondary | ICD-10-CM | POA: Diagnosis not present

## 2017-05-17 DIAGNOSIS — I2510 Atherosclerotic heart disease of native coronary artery without angina pectoris: Secondary | ICD-10-CM | POA: Diagnosis not present

## 2017-05-17 DIAGNOSIS — Z7902 Long term (current) use of antithrombotics/antiplatelets: Secondary | ICD-10-CM | POA: Diagnosis not present

## 2017-05-17 DIAGNOSIS — Z9181 History of falling: Secondary | ICD-10-CM | POA: Diagnosis not present

## 2017-05-17 DIAGNOSIS — I509 Heart failure, unspecified: Secondary | ICD-10-CM | POA: Diagnosis not present

## 2017-05-17 DIAGNOSIS — I69354 Hemiplegia and hemiparesis following cerebral infarction affecting left non-dominant side: Secondary | ICD-10-CM | POA: Diagnosis not present

## 2017-05-17 NOTE — Telephone Encounter (Signed)
Received fax from [facility] requesting orders, fax will on the pcp inbox

## 2017-05-18 ENCOUNTER — Encounter: Payer: Self-pay | Admitting: Family Medicine

## 2017-05-18 ENCOUNTER — Ambulatory Visit: Payer: Medicare Other | Attending: Family Medicine | Admitting: Family Medicine

## 2017-05-18 ENCOUNTER — Telehealth: Payer: Self-pay

## 2017-05-18 VITALS — BP 143/81 | HR 85 | Temp 97.5°F | Ht 72.0 in | Wt 238.0 lb

## 2017-05-18 DIAGNOSIS — Z885 Allergy status to narcotic agent status: Secondary | ICD-10-CM | POA: Diagnosis not present

## 2017-05-18 DIAGNOSIS — Z888 Allergy status to other drugs, medicaments and biological substances status: Secondary | ICD-10-CM | POA: Diagnosis not present

## 2017-05-18 DIAGNOSIS — G4700 Insomnia, unspecified: Secondary | ICD-10-CM | POA: Insufficient documentation

## 2017-05-18 DIAGNOSIS — G2581 Restless legs syndrome: Secondary | ICD-10-CM | POA: Diagnosis not present

## 2017-05-18 DIAGNOSIS — G4709 Other insomnia: Secondary | ICD-10-CM

## 2017-05-18 DIAGNOSIS — K219 Gastro-esophageal reflux disease without esophagitis: Secondary | ICD-10-CM | POA: Insufficient documentation

## 2017-05-18 DIAGNOSIS — I110 Hypertensive heart disease with heart failure: Secondary | ICD-10-CM | POA: Diagnosis not present

## 2017-05-18 DIAGNOSIS — I10 Essential (primary) hypertension: Secondary | ICD-10-CM

## 2017-05-18 DIAGNOSIS — E119 Type 2 diabetes mellitus without complications: Secondary | ICD-10-CM | POA: Diagnosis not present

## 2017-05-18 DIAGNOSIS — I509 Heart failure, unspecified: Secondary | ICD-10-CM | POA: Diagnosis not present

## 2017-05-18 DIAGNOSIS — Z7902 Long term (current) use of antithrombotics/antiplatelets: Secondary | ICD-10-CM | POA: Diagnosis not present

## 2017-05-18 DIAGNOSIS — M62838 Other muscle spasm: Secondary | ICD-10-CM | POA: Diagnosis not present

## 2017-05-18 DIAGNOSIS — M1000 Idiopathic gout, unspecified site: Secondary | ICD-10-CM | POA: Diagnosis not present

## 2017-05-18 DIAGNOSIS — G4733 Obstructive sleep apnea (adult) (pediatric): Secondary | ICD-10-CM | POA: Insufficient documentation

## 2017-05-18 DIAGNOSIS — J449 Chronic obstructive pulmonary disease, unspecified: Secondary | ICD-10-CM | POA: Insufficient documentation

## 2017-05-18 DIAGNOSIS — I69354 Hemiplegia and hemiparesis following cerebral infarction affecting left non-dominant side: Secondary | ICD-10-CM | POA: Diagnosis not present

## 2017-05-18 DIAGNOSIS — I2510 Atherosclerotic heart disease of native coronary artery without angina pectoris: Secondary | ICD-10-CM | POA: Diagnosis not present

## 2017-05-18 DIAGNOSIS — E7800 Pure hypercholesterolemia, unspecified: Secondary | ICD-10-CM | POA: Insufficient documentation

## 2017-05-18 DIAGNOSIS — E1149 Type 2 diabetes mellitus with other diabetic neurological complication: Secondary | ICD-10-CM | POA: Diagnosis not present

## 2017-05-18 DIAGNOSIS — F0280 Dementia in other diseases classified elsewhere without behavioral disturbance: Secondary | ICD-10-CM | POA: Insufficient documentation

## 2017-05-18 DIAGNOSIS — I739 Peripheral vascular disease, unspecified: Secondary | ICD-10-CM | POA: Diagnosis not present

## 2017-05-18 DIAGNOSIS — Z9181 History of falling: Secondary | ICD-10-CM | POA: Diagnosis not present

## 2017-05-18 DIAGNOSIS — Z79899 Other long term (current) drug therapy: Secondary | ICD-10-CM | POA: Diagnosis not present

## 2017-05-18 DIAGNOSIS — Z9049 Acquired absence of other specified parts of digestive tract: Secondary | ICD-10-CM | POA: Insufficient documentation

## 2017-05-18 LAB — POCT GLYCOSYLATED HEMOGLOBIN (HGB A1C): Hemoglobin A1C: 5.9

## 2017-05-18 MED ORDER — TRAZODONE HCL 150 MG PO TABS: 150.0000 mg | ORAL_TABLET | Freq: Every evening | ORAL | 3 refills | Status: DC | PRN

## 2017-05-18 MED ORDER — PANTOPRAZOLE SODIUM 40 MG PO TBEC: 40.0000 mg | DELAYED_RELEASE_TABLET | ORAL | 3 refills | Status: DC

## 2017-05-18 MED ORDER — DULOXETINE HCL 60 MG PO CPEP: 60.0000 mg | ORAL_CAPSULE | Freq: Every day | ORAL | 3 refills | Status: DC

## 2017-05-18 MED ORDER — FUROSEMIDE 20 MG PO TABS: 60.0000 mg | ORAL_TABLET | Freq: Two times a day (BID) | ORAL | 11 refills | Status: DC

## 2017-05-18 MED ORDER — MEMANTINE HCL 10 MG PO TABS: 10.0000 mg | ORAL_TABLET | Freq: Two times a day (BID) | ORAL | 3 refills | Status: DC

## 2017-05-18 MED ORDER — ROPINIROLE HCL 0.5 MG PO TABS: 0.5000 mg | ORAL_TABLET | Freq: Every day | ORAL | 3 refills | Status: DC

## 2017-05-18 MED ORDER — CARVEDILOL 12.5 MG PO TABS: 12.5000 mg | ORAL_TABLET | Freq: Two times a day (BID) | ORAL | 3 refills | Status: DC

## 2017-05-18 MED ORDER — CLOPIDOGREL BISULFATE 75 MG PO TABS: 75.0000 mg | ORAL_TABLET | Freq: Every day | ORAL | 3 refills | Status: DC

## 2017-05-18 MED ORDER — PREGABALIN 150 MG PO CAPS: 150.0000 mg | ORAL_CAPSULE | Freq: Two times a day (BID) | ORAL | 3 refills | Status: DC

## 2017-05-18 MED ORDER — ATORVASTATIN CALCIUM 80 MG PO TABS: 80.0000 mg | ORAL_TABLET | Freq: Every day | ORAL | 3 refills | Status: DC

## 2017-05-18 MED ORDER — ALLOPURINOL 300 MG PO TABS: 300.0000 mg | ORAL_TABLET | Freq: Every day | ORAL | 3 refills | Status: DC

## 2017-05-18 MED ORDER — LISINOPRIL 5 MG PO TABS: 5.0000 mg | ORAL_TABLET | Freq: Every day | ORAL | 5 refills | Status: DC

## 2017-05-18 MED ORDER — ALBUTEROL SULFATE HFA 108 (90 BASE) MCG/ACT IN AERS: INHALATION_SPRAY | RESPIRATORY_TRACT | 3 refills | Status: DC

## 2017-05-18 MED ORDER — TIZANIDINE HCL 4 MG PO TABS: 4.0000 mg | ORAL_TABLET | Freq: Three times a day (TID) | ORAL | 3 refills | Status: DC

## 2017-05-18 MED ORDER — FLUTICASONE-SALMETEROL 250-50 MCG/DOSE IN AEPB: 1.0000 | INHALATION_SPRAY | Freq: Two times a day (BID) | RESPIRATORY_TRACT | 3 refills | Status: DC

## 2017-05-18 NOTE — Patient Instructions (Signed)
Neuropathic Pain Neuropathic pain is pain caused by damage to the nerves that are responsible for certain sensations in your body (sensory nerves). The pain can be caused by damage to:  The sensory nerves that send signals to your spinal cord and brain (peripheral nervous system).  The sensory nerves in your brain or spinal cord (central nervous system).  Neuropathic pain can make you more sensitive to pain. What would be a minor sensation for most people may feel very painful if you have neuropathic pain. This is usually a long-term condition that can be difficult to treat. The type of pain can differ from person to person. It may start suddenly (acute), or it may develop slowly and last for a long time (chronic). Neuropathic pain may come and go as damaged nerves heal or may stay at the same level for years. It often causes emotional distress, loss of sleep, and a lower quality of life. What are the causes? The most common cause of damage to a sensory nerve is diabetes. Many other diseases and conditions can also cause neuropathic pain. Causes of neuropathic pain can be classified as:  Toxic. Many drugs and chemicals can cause toxic damage. The most common cause of toxic neuropathic pain is damage from drug treatment for cancer (chemotherapy).  Metabolic. This type of pain can happen when a disease causes imbalances that damage nerves. Diabetes is the most common of these diseases. Vitamin B deficiency caused by long-term alcohol abuse is another common cause.  Traumatic. Any injury that cuts, crushes, or stretches a nerve can cause damage and pain. A common example is feeling pain after losing an arm or leg (phantom limb pain).  Compression-related. If a sensory nerve gets trapped or compressed for a long period of time, the blood supply to the nerve can be cut off.  Vascular. Many blood vessel diseases can cause neuropathic pain by decreasing blood supply and oxygen to nerves.  Autoimmune.  This type of pain results from diseases in which the body's defense system mistakenly attacks sensory nerves. Examples of autoimmune diseases that can cause neuropathic pain include lupus and multiple sclerosis.  Infectious. Many types of viral infections can damage sensory nerves and cause pain. Shingles infection is a common cause of this type of pain.  Inherited. Neuropathic pain can be a symptom of many diseases that are passed down through families (genetic).  What are the signs or symptoms? The main symptom is pain. Neuropathic pain is often described as:  Burning.  Shock-like.  Stinging.  Hot or cold.  Itching.  How is this diagnosed? No single test can diagnose neuropathic pain. Your health care provider will do a physical exam and ask you about your pain. You may use a pain scale to describe how bad your pain is. You may also have tests to see if you have a high sensitivity to pain and to help find the cause and location of any sensory nerve damage. These tests may include:  Imaging studies, such as: ? X-rays. ? CT scan. ? MRI.  Nerve conduction studies to test how well nerve signals travel through your sensory nerves (electrodiagnostic testing).  Stimulating your sensory nerves through electrodes on your skin and measuring the response in your spinal cord and brain (somatosensory evoked potentials).  How is this treated? Treatment for neuropathic pain may change over time. You may need to try different treatment options or a combination of treatments. Some options include:  Over-the-counter pain relievers.  Prescription medicines. Some medicines   used to treat other conditions may also help neuropathic pain. These include medicines to: ? Control seizures (anticonvulsants). ? Relieve depression (antidepressants).  Prescription-strength pain relievers (narcotics). These are usually used when other pain relievers do not help.  Transcutaneous nerve stimulation (TENS).  This uses electrical currents to block painful nerve signals. The treatment is painless.  Topical and local anesthetics. These are medicines that numb the nerves. They can be injected as a nerve block or applied to the skin.  Alternative treatments, such as: ? Acupuncture. ? Meditation. ? Massage. ? Physical therapy. ? Pain management programs. ? Counseling.  Follow these instructions at home:  Learn as much as you can about your condition.  Take medicines only as directed by your health care provider.  Work closely with all your health care providers to find what works best for you.  Have a good support system at home.  Consider joining a chronic pain support group. Contact a health care provider if:  Your pain treatments are not helping.  You are having side effects from your medicines.  You are struggling with fatigue, mood changes, depression, or anxiety. This information is not intended to replace advice given to you by your health care provider. Make sure you discuss any questions you have with your health care provider. Document Released: 10/24/2003 Document Revised: 08/16/2015 Document Reviewed: 07/06/2013 Elsevier Interactive Patient Education  2018 Elsevier Inc.  

## 2017-05-18 NOTE — Telephone Encounter (Signed)
Met with the patient and his caregiver, [name].  The patient reported that he did not like the new caregiver that was assigned to him and he requested that [name] return to caring for him.  [name] noted that she is providing his PCS services but she is concerned about the time that she is not with him and he is alone. She reports frequent falls and inability to care for himself. He options of skilled nursing facilities were discussed but the patient refused to consider placement.  He said that he wishes to stay in his own home.  He does not want to " hand over money and cigarette to someone else."  He explained how he wants to remain as independent as possible. The PACE program was discussed briefly and he was not interested. He also acknowledged that he would require more help than an assisted living facility would provide.  [name] stated that she understands his desires and accepts the fact that he can make his own decisions. The option of the CAP program was discussed and the patient was agreeable to having his name placed on a waiting list, The contact # for the CAP program was shared with [name] who stated that she would contact the program and request an application. Informed the patient and [name] that there could be a 6+month waiting list for CAP and both stated they understood and would still have him put on the waiting list.

## 2017-05-18 NOTE — Telephone Encounter (Signed)
[facility] called to get an updated med list so she can add the neublizer    She going to fax the paperwork so you can fax it back to them with medication list

## 2017-05-18 NOTE — Progress Notes (Signed)
Refill on Valtrex.

## 2017-05-18 NOTE — Progress Notes (Signed)
Subjective:  Patient ID: [name], male    DOB: 01-15-1951  Age: 67 y.o. MRN: [id number]  CC: Diabetes and Hypertension   HPI [name] is a 67 year old male with a history of Diabetes mellitus (diet-controlled A1c of 6.2) HTN, CHF (EF 50-55% from 2-D echo of 07/2016), COPD, multiple previous CVAs with residual left hemiparesis, hyperlipidemia, gout, dementia,PVD here for a follow up visit accompanied by his PCS worker -[name].  Since his last office visit he has had a couple of ED visits secondary to a fall the last of which was last month at which time his hydralazine was discontinued.  CT head 04/13/17: IMPRESSION: Mild diffuse cortical atrophy. Old right parietal infarction. Mild chronic ischemic white matter disease. Small left parietal scalp hematoma. No acute intracranial abnormality seen.  Multilevel degenerative disc disease. No acute abnormality seen in the cervical spine  His PCS worker is concerned that he is unsafe at home due to multiple falls as a result of left hemiparesis; patient does have a motorized wheelchair he uses about and has a couple of hours of PCS services per day but is not open to placement in a skilled nursing facility.  Denies lightheadedness, blurry vision, nausea vomiting.  His diabetes is diet controlled and he continues to experience neuropathy symptoms. I Had increased his Lyrica dose to 150 mg twice daily at his last visit however he reports no improvement in his neuropathy. He has generalized pruritus with pinpoint rash in his lower extremities for which he scheduled an appointment with dermatology which comes up tomorrow.  Denies introduction of new soaps or creams.  I had treated him for a Valtrex rash in his right forearm at his last visit. He also complains of jerks in his leg which occur at night and this is concerning to him. His COPD is uncontrolled on he is having to use his albuterol MDI several times a day; of note he  continues to smoke and is not interested in quitting.     Past Medical History:  Diagnosis Date  . Alcohol abuse    H/O  . Anxiety   . Arthritis   . Asthma   . CAD (coronary artery disease) 05/27/10   Cath: severe single vessell CAD left cx midportion obtuse marginal 2 to 3.  [name] CHF (congestive heart failure) ([place])   . COPD (chronic obstructive pulmonary disease) ([place])   . COPD (chronic obstructive pulmonary disease) ([place])   . Depression   . Diabetes mellitus, type 2 ([place]) 03/13/2015  . GERD (gastroesophageal reflux disease)   . HCAP (healthcare-associated pneumonia) 10/15/2015  . History of DVT of lower extremity   . Hyperlipemia   . Hyperlipidemia   . Hypertension   . Myocardial infarction ([place]) 2000  . Orbital fracture ([place]) 12/2012  . OSA on CPAP   . Peripheral vascular disease, unspecified ([place])    08/20/10 doppler: increase in right ABI post-op. Left ABI stable. S/P bi-fem bypass surgery  . Pneumonia 04/04/2012  . Shortness of breath   . Stroke ([place])   . Tobacco abuse     Past Surgical History:  Procedure Laterality Date  . abdoninal ao angio & bifem angio  05/27/10   Patent graft, occluded bil stents with no retrograde flow into the hypogastric arteries. 100% occl left ant. tibial artery, 70% to 80% to 100% stenosis right superficial fem artery above adductor canal. 100 % occl right ant. tibial vessell  . Aortogram w/ PTCA  09/292003  .  BACK SURGERY    . CARDIAC CATHETERIZATION  05/27/10   severe CAD left cx  . CHOLECYSTECTOMY    . ESOPHAGOGASTRIC FUNDOPLICATION    . EYE SURGERY    . FEMORAL BYPASS  08/19/10   Right Fem-Pop  . IR ANGIO INTRA EXTRACRAN SEL COM CAROTID INNOMINATE BILAT MOD SED  07/20/2016  . IR ANGIO VERTEBRAL SEL SUBCLAVIAN INNOMINATE UNI R MOD SED  07/20/2016  . IR ANGIO VERTEBRAL SEL VERTEBRAL UNI L MOD SED  07/20/2016  . IR RADIOLOGIST EVAL & MGMT  05/27/2016  . JOINT REPLACEMENT    . PERIPHERAL VASCULAR CATHETERIZATION N/A 12/10/2014   Procedure:  Abdominal Aortogram;  Surgeon: Angelia Mould, MD;  Location: [facility];  Service: Cardiovascular;  Laterality: N/A;  . RADIOLOGY WITH ANESTHESIA N/A 02/08/2015   Procedure: RADIOLOGY WITH ANESTHESIA;  Surgeon: Luanne Bras, MD;  Location: [facility];  Service: Radiology;  Laterality: N/A;  . TEE WITHOUT CARDIOVERSION N/A 08/29/2014   Procedure: TRANSESOPHAGEAL ECHOCARDIOGRAM (TEE);  Surgeon: Josue Hector, MD;  Location: [facility];  Service: Cardiovascular;  Laterality: N/A;  . TOTAL KNEE ARTHROPLASTY      Family History  Problem Relation Age of Onset  . Heart attack Mother   . Heart failure Mother   . Cirrhosis Father   . Heart failure Brother   . Cancer Brother   . Hypertension Neg Hx        UNKNOWN  . Stroke Neg Hx        UNKNOWN    Allergies  Allergen Reactions  . Darvocet [Propoxyphene N-Acetaminophen] Hives  . Haldol [Haloperidol Decanoate] Hives  . Acetaminophen Nausea Only    Upset stomach, tolerates Hydrocodone/APAP if taken with food  . Metformin And Related Nausea And Vomiting  . Norco [Hydrocodone-Acetaminophen] Nausea And Vomiting    Tolerates if taken with food     Outpatient Medications Prior to Visit  Medication Sig Dispense Refill  . ACCU-CHEK SOFTCLIX LANCETS lancets Use 3 times daily before meals 100 each 5  . albuterol (PROVENTIL) (2.5 MG/3ML) 0.083% nebulizer solution Take 3 mLs (2.5 mg total) by nebulization every 6 (six) hours as needed for wheezing or shortness of breath. 75 mL 0  . Blood Glucose Monitoring Suppl (ACCU-CHEK AVIVA) device Use 3 times daily before meals. 1 each 0  . glucose blood (ACCU-CHEK AVIVA) test strip Use 3 times daily before meals as directed. 100 each 12  . lactulose (CHRONULAC) 10 GM/15ML solution Take 15 mLs (10 g total) by mouth 2 (two) times daily as needed for mild constipation. 946 mL 2  . Lancet Devices (ACCU-CHEK SOFTCLIX) lancets Use 3 times daily before meals 1 each 5  . Misc. Devices (ROLLATOR)  MISC 1 each by Does not apply route daily. 1 each 0  . polyethylene glycol (MIRALAX / GLYCOLAX) packet Take 17 g by mouth daily. 14 each 0  . Selenium Sulfide 2.25 % SHAM Apply 1 application topically 2 (two) times a week. 180 mL 1  . triamcinolone cream (KENALOG) 0.1 % Apply 1 application topically daily as needed (for dermatitis). 30 g 0  . albuterol (PROAIR HFA) 108 (90 Base) MCG/ACT inhaler Inhale 2 puffs into the lungs every 6 (six) hours as needed for wheezing or shortness of breath. 8.5 g 1  . atorvastatin (LIPITOR) 80 MG tablet Take 1 tablet (80 mg total) by mouth daily at 6 PM. 30 tablet 3  . carvedilol (COREG) 12.5 MG tablet Take 1 tablet (12.5 mg total) by mouth  2 (two) times daily with a meal. 60 tablet 3  . clopidogrel (PLAVIX) 75 MG tablet Take 1 tablet (75 mg total) by mouth daily. 30 tablet 3  . FLUoxetine (PROZAC) 20 MG capsule TAKE ONE CAPSULE BY MOUTH ONCE DAILY (AM)  11  . furosemide (LASIX) 20 MG tablet Take 60 mg by mouth 2 (two) times daily.  11  . hydrALAZINE (APRESOLINE) 50 MG tablet Take 50 mg by mouth daily.   11  . memantine (NAMENDA) 10 MG tablet Take 1 tablet (10 mg total) by mouth 2 (two) times daily. 60 tablet 3  . pantoprazole (PROTONIX) 40 MG tablet Take 1 tablet (40 mg total) by mouth every morning. 30 tablet 3  . pregabalin (LYRICA) 150 MG capsule Take 1 capsule (150 mg total) by mouth 2 (two) times daily. 60 capsule 3  . tiZANidine (ZANAFLEX) 4 MG tablet Take 1 tablet (4 mg total) by mouth 3 (three) times daily. 90 tablet 3  . traZODone (DESYREL) 50 MG tablet Take 1 tablet (50 mg total) by mouth at bedtime as needed for sleep. 30 tablet 3  . allopurinol (ZYLOPRIM) 300 MG tablet Take 1 tablet (300 mg total) by mouth daily. (Patient not taking: Reported on 05/18/2017) 30 tablet 3  . lisinopril (PRINIVIL,ZESTRIL) 5 MG tablet Take 1 tablet (5 mg total) by mouth daily. (Patient not taking: Reported on 12/25/2016) 30 tablet 5  . predniSONE (STERAPRED UNI-PAK 21 TAB) 10  MG (21) TBPK tablet Take 6 tabs by mouth daily  for 2 days, then 5 tabs for 2 days, then 4 tabs for 2 days, then 3 tabs for 2 days, 2 tabs for 2 days, then 1 tab by mouth daily for 2 days (Patient not taking: Reported on 05/18/2017) 42 tablet 0  . valACYclovir (VALTREX) 1000 MG tablet Take 1 tablet (1,000 mg total) by mouth 2 (two) times daily. (Patient not taking: Reported on 05/18/2017) 20 tablet 0   No facility-administered medications prior to visit.     ROS Review of Systems  Constitutional: Negative for activity change and appetite change.  HENT: Negative for sinus pressure and sore throat.   Eyes: Negative for visual disturbance.  Respiratory: Negative for cough, chest tightness and shortness of breath.   Cardiovascular: Negative for chest pain and leg swelling.  Gastrointestinal: Negative for abdominal distention, abdominal pain, constipation and diarrhea.  Endocrine: Negative.   Genitourinary: Negative for dysuria.  Musculoskeletal:       See hpi  Skin: Positive for rash.  Allergic/Immunologic: Negative.   Neurological: Positive for weakness and numbness. Negative for light-headedness.  Psychiatric/Behavioral: Negative for dysphoric mood and suicidal ideas.    Objective:  BP (!) 143/81   Pulse 85   Temp (!) 97.5 F (36.4 C) (Oral)   Ht 6' (1.829 m)   Wt 238 lb (108 kg)   SpO2 96%   BMI 32.28 kg/m   BP/Weight 05/18/2017 06/10/7614 0/08/3708  Systolic BP 626 948 546  Diastolic BP 81 52 58  Wt. (Lbs) 238 - 215  BMI 32.28 - 29.16      Physical Exam  Constitutional: He is oriented to person, place, and time. He appears well-developed and well-nourished.  Cardiovascular: Normal rate and normal heart sounds.  No murmur heard. Unable to palpate DP pulses  Pulmonary/Chest: Effort normal and breath sounds normal. He has no wheezes. He has no rales. He exhibits no tenderness.  Abdominal: Soft. Bowel sounds are normal. He exhibits no distension and no mass. There is no  tenderness.  Musculoskeletal: He exhibits edema (edema of LUE).  Neurological: He is alert and oriented to person, place, and time.  Reduced muscle strength in left upper and left lower extremity  Skin: Rash (hyperpigmented scars on right forearm) noted.    CMP Latest Ref Rng & Units 04/13/2017 03/17/2017 01/05/2017  Glucose 65 - 99 mg/dL 102(H) 122(H) 134(H)  BUN 6 - 20 mg/dL 12 21(H) 34(H)  Creatinine 0.61 - 1.24 mg/dL 0.91 0.98 1.18  Sodium 135 - 145 mmol/L 144 142 140  Potassium 3.5 - 5.1 mmol/L 4.5 4.0 5.1  Chloride 101 - 111 mmol/L 112(H) 108 111  CO2 22 - 32 mmol/L 23 26 21(L)  Calcium 8.9 - 10.3 mg/dL 9.1 9.5 9.3  Total Protein 6.5 - 8.1 g/dL - 6.6 6.0(L)  Total Bilirubin 0.3 - 1.2 mg/dL - 0.8 1.1  Alkaline Phos 38 - 126 U/L - 65 64  AST 15 - 41 U/L - 22 30  ALT 17 - 63 U/L - 18 12(L)    Lab Results  Component Value Date   HGBA1C 5.9 05/18/2017     Assessment & Plan:   1. Type 2 diabetes mellitus with other neurologic complication, without long-term current use of insulin (HCC) Controlled with A1c of 5.9 - POCT glucose (manual entry) - POCT glycosylated hemoglobin (Hb A1C) - pregabalin (LYRICA) 150 MG capsule; Take 1 capsule (150 mg total) by mouth 2 (two) times daily.  Dispense: 60 capsule; Refill: 3  2. Chronic obstructive pulmonary disease, unspecified COPD type ([place]) Uncontrolled due to frequent MDI use Commenced on Advair Provided a prescription for albuterol nebulizer treatment to home for meds DME - albuterol (PROAIR HFA) 108 (90 Base) MCG/ACT inhaler; Inhale 2 puffs into the lungs every 6 (six) hours as needed for wheezing or shortness of breath.  Dispense: 8.5 g; Refill: 3  3. Idiopathic gout, unspecified chronicity, unspecified site No recent flares - allopurinol (ZYLOPRIM) 300 MG tablet; Take 1 tablet (300 mg total) by mouth daily.  Dispense: 30 tablet; Refill: 3  4. Pure hypercholesterolemia Stable - atorvastatin (LIPITOR) 80 MG tablet; Take 1 tablet  (80 mg total) by mouth daily at 6 PM.  Dispense: 30 tablet; Refill: 3  5. Essential hypertension Controlled He does seem to have labile blood pressures and possible orthostatic hypotension Will reduce dose of lisinopril if he continues to experience.  At home however has been advised to change positions slowly - carvedilol (COREG) 12.5 MG tablet; Take 1 tablet (12.5 mg total) by mouth 2 (two) times daily with a meal.  Dispense: 60 tablet; Refill: 3 - lisinopril (PRINIVIL,ZESTRIL) 5 MG tablet; Take 1 tablet (5 mg total) by mouth daily.  Dispense: 30 tablet; Refill: 5  6. Hemiparesis affecting left side as late effect of cerebrovascular accident [facility]) He is high risk for falls and we have had an extended discussion with him and the case manager regarding the possible need for placement in skilled nursing facility for safety issues however the patient declines We will proceed with application for the CAP Program - clopidogrel (PLAVIX) 75 MG tablet; Take 1 tablet (75 mg total) by mouth daily.  Dispense: 30 tablet; Refill: 3  7. Dementia due to another medical condition, without behavioral disturbance Stable - memantine (NAMENDA) 10 MG tablet; Take 1 tablet (10 mg total) by mouth 2 (two) times daily.  Dispense: 60 tablet; Refill: 3  8. Other insomnia Uncontrolled Increase trazodone - traZODone (DESYREL) 150 MG tablet; Take 1 tablet (150 mg total) by mouth  at bedtime as needed for sleep.  Dispense: 30 tablet; Refill: 3  9. Muscle spasm Uncontrolled Hopefully addition of Cymbalta will help with symptoms - tiZANidine (ZANAFLEX) 4 MG tablet; Take 1 tablet (4 mg total) by mouth 3 (three) times daily.  Dispense: 90 tablet; Refill: 3  10. Restless leg Uncontrolled Commence Requip   Meds ordered this encounter  Medications  . albuterol (PROAIR HFA) 108 (90 Base) MCG/ACT inhaler    Sig: Inhale 2 puffs into the lungs every 6 (six) hours as needed for wheezing or shortness of breath.     Dispense:  8.5 g    Refill:  3  . allopurinol (ZYLOPRIM) 300 MG tablet    Sig: Take 1 tablet (300 mg total) by mouth daily.    Dispense:  30 tablet    Refill:  3  . atorvastatin (LIPITOR) 80 MG tablet    Sig: Take 1 tablet (80 mg total) by mouth daily at 6 PM.    Dispense:  30 tablet    Refill:  3  . carvedilol (COREG) 12.5 MG tablet    Sig: Take 1 tablet (12.5 mg total) by mouth 2 (two) times daily with a meal.    Dispense:  60 tablet    Refill:  3  . clopidogrel (PLAVIX) 75 MG tablet    Sig: Take 1 tablet (75 mg total) by mouth daily.    Dispense:  30 tablet    Refill:  3  . rOPINIRole (REQUIP) 0.5 MG tablet    Sig: Take 1 tablet (0.5 mg total) by mouth at bedtime.    Dispense:  30 tablet    Refill:  3  . furosemide (LASIX) 20 MG tablet    Sig: Take 3 tablets (60 mg total) by mouth 2 (two) times daily.    Dispense:  30 tablet    Refill:  11  . lisinopril (PRINIVIL,ZESTRIL) 5 MG tablet    Sig: Take 1 tablet (5 mg total) by mouth daily.    Dispense:  30 tablet    Refill:  5    Discontinue previous dose  . memantine (NAMENDA) 10 MG tablet    Sig: Take 1 tablet (10 mg total) by mouth 2 (two) times daily.    Dispense:  60 tablet    Refill:  3  . pantoprazole (PROTONIX) 40 MG tablet    Sig: Take 1 tablet (40 mg total) by mouth every morning.    Dispense:  30 tablet    Refill:  3  . pregabalin (LYRICA) 150 MG capsule    Sig: Take 1 capsule (150 mg total) by mouth 2 (two) times daily.    Dispense:  60 capsule    Refill:  3    Discontinue Gabapentin  . traZODone (DESYREL) 150 MG tablet    Sig: Take 1 tablet (150 mg total) by mouth at bedtime as needed for sleep.    Dispense:  30 tablet    Refill:  3  . DULoxetine (CYMBALTA) 60 MG capsule    Sig: Take 1 capsule (60 mg total) by mouth daily.    Dispense:  30 capsule    Refill:  3    Discontinue Prozac  . tiZANidine (ZANAFLEX) 4 MG tablet    Sig: Take 1 tablet (4 mg total) by mouth 3 (three) times daily.    Dispense:  90  tablet    Refill:  3  . Fluticasone-Salmeterol (ADVAIR DISKUS) 250-50 MCG/DOSE AEPB    Sig: Inhale 1 puff into  the lungs 2 (two) times daily.    Dispense:  1 each    Refill:  3    Follow-up: Return in about 3 months (around 08/17/2017) for Follow-up of chronic medical conditions.   Charlott Rakes MD

## 2017-05-19 ENCOUNTER — Other Ambulatory Visit: Payer: Self-pay | Admitting: Family Medicine

## 2017-05-19 ENCOUNTER — Encounter: Payer: Self-pay | Admitting: Family Medicine

## 2017-05-19 DIAGNOSIS — I2510 Atherosclerotic heart disease of native coronary artery without angina pectoris: Secondary | ICD-10-CM | POA: Diagnosis not present

## 2017-05-19 DIAGNOSIS — D2371 Other benign neoplasm of skin of right lower limb, including hip: Secondary | ICD-10-CM | POA: Diagnosis not present

## 2017-05-19 DIAGNOSIS — I739 Peripheral vascular disease, unspecified: Secondary | ICD-10-CM | POA: Diagnosis not present

## 2017-05-19 DIAGNOSIS — I110 Hypertensive heart disease with heart failure: Secondary | ICD-10-CM | POA: Diagnosis not present

## 2017-05-19 DIAGNOSIS — Z9181 History of falling: Secondary | ICD-10-CM | POA: Diagnosis not present

## 2017-05-19 DIAGNOSIS — J449 Chronic obstructive pulmonary disease, unspecified: Secondary | ICD-10-CM | POA: Diagnosis not present

## 2017-05-19 DIAGNOSIS — Z85828 Personal history of other malignant neoplasm of skin: Secondary | ICD-10-CM | POA: Diagnosis not present

## 2017-05-19 DIAGNOSIS — E119 Type 2 diabetes mellitus without complications: Secondary | ICD-10-CM | POA: Diagnosis not present

## 2017-05-19 DIAGNOSIS — C44622 Squamous cell carcinoma of skin of right upper limb, including shoulder: Secondary | ICD-10-CM | POA: Diagnosis not present

## 2017-05-19 DIAGNOSIS — D225 Melanocytic nevi of trunk: Secondary | ICD-10-CM | POA: Diagnosis not present

## 2017-05-19 DIAGNOSIS — Z7902 Long term (current) use of antithrombotics/antiplatelets: Secondary | ICD-10-CM | POA: Diagnosis not present

## 2017-05-19 DIAGNOSIS — I509 Heart failure, unspecified: Secondary | ICD-10-CM | POA: Diagnosis not present

## 2017-05-19 DIAGNOSIS — L218 Other seborrheic dermatitis: Secondary | ICD-10-CM | POA: Diagnosis not present

## 2017-05-19 DIAGNOSIS — I69354 Hemiplegia and hemiparesis following cerebral infarction affecting left non-dominant side: Secondary | ICD-10-CM | POA: Diagnosis not present

## 2017-05-20 ENCOUNTER — Telehealth: Payer: Self-pay

## 2017-05-20 NOTE — Telephone Encounter (Signed)
Call returned to patient's caregiver, [name].  She stated that [facility] is requesting a prescription for the nebulizer.  Call placed to [facility] # [phone]. Spoke to [employer] who stated that the patient called on 04/29/17 and requested to be put back on brovana and budesonide as he has been in the past. She is requesting an updated medication list as the list that they have does not have budesonide or brovana on the list. She stated that they need the documentation in order to bill medicare.  Informed her that the provider would be notified.   brovana and budesonide are not currently on the patient's medication profile.

## 2017-05-21 DIAGNOSIS — J449 Chronic obstructive pulmonary disease, unspecified: Secondary | ICD-10-CM | POA: Diagnosis not present

## 2017-05-21 DIAGNOSIS — Z9181 History of falling: Secondary | ICD-10-CM | POA: Diagnosis not present

## 2017-05-21 DIAGNOSIS — I509 Heart failure, unspecified: Secondary | ICD-10-CM | POA: Diagnosis not present

## 2017-05-21 DIAGNOSIS — I739 Peripheral vascular disease, unspecified: Secondary | ICD-10-CM | POA: Diagnosis not present

## 2017-05-21 DIAGNOSIS — I69354 Hemiplegia and hemiparesis following cerebral infarction affecting left non-dominant side: Secondary | ICD-10-CM | POA: Diagnosis not present

## 2017-05-21 DIAGNOSIS — E119 Type 2 diabetes mellitus without complications: Secondary | ICD-10-CM | POA: Diagnosis not present

## 2017-05-21 DIAGNOSIS — I2510 Atherosclerotic heart disease of native coronary artery without angina pectoris: Secondary | ICD-10-CM | POA: Diagnosis not present

## 2017-05-21 DIAGNOSIS — Z7902 Long term (current) use of antithrombotics/antiplatelets: Secondary | ICD-10-CM | POA: Diagnosis not present

## 2017-05-21 DIAGNOSIS — I110 Hypertensive heart disease with heart failure: Secondary | ICD-10-CM | POA: Diagnosis not present

## 2017-05-21 NOTE — Telephone Encounter (Signed)
He should not be on Brovana.  I placed him on Advair and albuterol nebulizer solution.  A prescription for albuterol nebulizer solution was provided to my CMA along with a [facility] form to be faxed on the day of the visit.

## 2017-05-24 DIAGNOSIS — E119 Type 2 diabetes mellitus without complications: Secondary | ICD-10-CM | POA: Diagnosis not present

## 2017-05-24 DIAGNOSIS — Z7902 Long term (current) use of antithrombotics/antiplatelets: Secondary | ICD-10-CM | POA: Diagnosis not present

## 2017-05-24 DIAGNOSIS — I739 Peripheral vascular disease, unspecified: Secondary | ICD-10-CM | POA: Diagnosis not present

## 2017-05-24 DIAGNOSIS — I509 Heart failure, unspecified: Secondary | ICD-10-CM | POA: Diagnosis not present

## 2017-05-24 DIAGNOSIS — I69354 Hemiplegia and hemiparesis following cerebral infarction affecting left non-dominant side: Secondary | ICD-10-CM | POA: Diagnosis not present

## 2017-05-24 DIAGNOSIS — I2510 Atherosclerotic heart disease of native coronary artery without angina pectoris: Secondary | ICD-10-CM | POA: Diagnosis not present

## 2017-05-24 DIAGNOSIS — Z9181 History of falling: Secondary | ICD-10-CM | POA: Diagnosis not present

## 2017-05-24 DIAGNOSIS — J449 Chronic obstructive pulmonary disease, unspecified: Secondary | ICD-10-CM | POA: Diagnosis not present

## 2017-05-24 DIAGNOSIS — I110 Hypertensive heart disease with heart failure: Secondary | ICD-10-CM | POA: Diagnosis not present

## 2017-05-24 NOTE — Telephone Encounter (Signed)
Faxed back 4/10 and 4/15. per pcp nurse

## 2017-05-26 DIAGNOSIS — E119 Type 2 diabetes mellitus without complications: Secondary | ICD-10-CM | POA: Diagnosis not present

## 2017-05-26 DIAGNOSIS — I2510 Atherosclerotic heart disease of native coronary artery without angina pectoris: Secondary | ICD-10-CM | POA: Diagnosis not present

## 2017-05-26 DIAGNOSIS — J449 Chronic obstructive pulmonary disease, unspecified: Secondary | ICD-10-CM | POA: Diagnosis not present

## 2017-05-26 DIAGNOSIS — I110 Hypertensive heart disease with heart failure: Secondary | ICD-10-CM | POA: Diagnosis not present

## 2017-05-26 DIAGNOSIS — I739 Peripheral vascular disease, unspecified: Secondary | ICD-10-CM | POA: Diagnosis not present

## 2017-05-26 DIAGNOSIS — I69354 Hemiplegia and hemiparesis following cerebral infarction affecting left non-dominant side: Secondary | ICD-10-CM | POA: Diagnosis not present

## 2017-05-26 DIAGNOSIS — I509 Heart failure, unspecified: Secondary | ICD-10-CM | POA: Diagnosis not present

## 2017-05-26 DIAGNOSIS — Z7902 Long term (current) use of antithrombotics/antiplatelets: Secondary | ICD-10-CM | POA: Diagnosis not present

## 2017-05-26 DIAGNOSIS — Z9181 History of falling: Secondary | ICD-10-CM | POA: Diagnosis not present

## 2017-05-28 DIAGNOSIS — J449 Chronic obstructive pulmonary disease, unspecified: Secondary | ICD-10-CM | POA: Diagnosis not present

## 2017-05-28 DIAGNOSIS — Z7902 Long term (current) use of antithrombotics/antiplatelets: Secondary | ICD-10-CM | POA: Diagnosis not present

## 2017-05-28 DIAGNOSIS — I739 Peripheral vascular disease, unspecified: Secondary | ICD-10-CM | POA: Diagnosis not present

## 2017-05-28 DIAGNOSIS — E119 Type 2 diabetes mellitus without complications: Secondary | ICD-10-CM | POA: Diagnosis not present

## 2017-05-28 DIAGNOSIS — I69354 Hemiplegia and hemiparesis following cerebral infarction affecting left non-dominant side: Secondary | ICD-10-CM | POA: Diagnosis not present

## 2017-05-28 DIAGNOSIS — I509 Heart failure, unspecified: Secondary | ICD-10-CM | POA: Diagnosis not present

## 2017-05-28 DIAGNOSIS — I110 Hypertensive heart disease with heart failure: Secondary | ICD-10-CM | POA: Diagnosis not present

## 2017-05-28 DIAGNOSIS — I2510 Atherosclerotic heart disease of native coronary artery without angina pectoris: Secondary | ICD-10-CM | POA: Diagnosis not present

## 2017-05-28 DIAGNOSIS — Z9181 History of falling: Secondary | ICD-10-CM | POA: Diagnosis not present

## 2017-06-09 ENCOUNTER — Telehealth: Payer: Self-pay | Admitting: Family Medicine

## 2017-06-09 DIAGNOSIS — I509 Heart failure, unspecified: Secondary | ICD-10-CM | POA: Diagnosis not present

## 2017-06-09 DIAGNOSIS — I69354 Hemiplegia and hemiparesis following cerebral infarction affecting left non-dominant side: Secondary | ICD-10-CM | POA: Diagnosis not present

## 2017-06-09 DIAGNOSIS — I2510 Atherosclerotic heart disease of native coronary artery without angina pectoris: Secondary | ICD-10-CM | POA: Diagnosis not present

## 2017-06-09 DIAGNOSIS — Z7902 Long term (current) use of antithrombotics/antiplatelets: Secondary | ICD-10-CM | POA: Diagnosis not present

## 2017-06-09 DIAGNOSIS — I110 Hypertensive heart disease with heart failure: Secondary | ICD-10-CM | POA: Diagnosis not present

## 2017-06-09 DIAGNOSIS — E1151 Type 2 diabetes mellitus with diabetic peripheral angiopathy without gangrene: Secondary | ICD-10-CM | POA: Diagnosis not present

## 2017-06-09 DIAGNOSIS — J449 Chronic obstructive pulmonary disease, unspecified: Secondary | ICD-10-CM | POA: Diagnosis not present

## 2017-06-09 DIAGNOSIS — Z9181 History of falling: Secondary | ICD-10-CM | POA: Diagnosis not present

## 2017-06-09 NOTE — Telephone Encounter (Signed)
[name] from [facility]. Patient had a fall last night. Patient has a small skin tear on left elbow.  Complains of pain in both shoulders and knees. Patient declined er/hospital. Decrease r.o.m. in left arm and standing balance is off. Bp decreases once standing. Verbal orders for home health pt to eval and extend home health order for ot 2xs wk for 2wks. CB# [phone]

## 2017-06-09 NOTE — Telephone Encounter (Signed)
Sharyn Lull was called and given verbal orders for OT and PT

## 2017-06-10 ENCOUNTER — Other Ambulatory Visit: Payer: Self-pay

## 2017-06-10 ENCOUNTER — Other Ambulatory Visit: Payer: Self-pay | Admitting: *Deleted

## 2017-06-10 DIAGNOSIS — Z7902 Long term (current) use of antithrombotics/antiplatelets: Secondary | ICD-10-CM | POA: Diagnosis not present

## 2017-06-10 DIAGNOSIS — E1151 Type 2 diabetes mellitus with diabetic peripheral angiopathy without gangrene: Secondary | ICD-10-CM | POA: Diagnosis not present

## 2017-06-10 DIAGNOSIS — I2510 Atherosclerotic heart disease of native coronary artery without angina pectoris: Secondary | ICD-10-CM | POA: Diagnosis not present

## 2017-06-10 DIAGNOSIS — I110 Hypertensive heart disease with heart failure: Secondary | ICD-10-CM | POA: Diagnosis not present

## 2017-06-10 DIAGNOSIS — I509 Heart failure, unspecified: Secondary | ICD-10-CM | POA: Diagnosis not present

## 2017-06-10 DIAGNOSIS — Z9181 History of falling: Secondary | ICD-10-CM | POA: Diagnosis not present

## 2017-06-10 DIAGNOSIS — J449 Chronic obstructive pulmonary disease, unspecified: Secondary | ICD-10-CM | POA: Diagnosis not present

## 2017-06-10 DIAGNOSIS — I69354 Hemiplegia and hemiparesis following cerebral infarction affecting left non-dominant side: Secondary | ICD-10-CM | POA: Diagnosis not present

## 2017-06-10 MED ORDER — FUROSEMIDE 20 MG PO TABS: 60.0000 mg | ORAL_TABLET | Freq: Two times a day (BID) | ORAL | 5 refills | Status: DC

## 2017-06-10 MED ORDER — FUROSEMIDE 20 MG PO TABS: 60.0000 mg | ORAL_TABLET | Freq: Two times a day (BID) | ORAL | 6 refills | Status: DC

## 2017-06-10 NOTE — Telephone Encounter (Signed)
Will route to PCP 

## 2017-06-10 NOTE — Telephone Encounter (Signed)
Has chronic lower extremity edema and left upper extremity edema and is on a huge dose of Lasix.  As long as he is not short of breath and his weight is not increasing I will not change his Lasix dose at this time.

## 2017-06-10 NOTE — Telephone Encounter (Signed)
JA

## 2017-06-16 DIAGNOSIS — Z7902 Long term (current) use of antithrombotics/antiplatelets: Secondary | ICD-10-CM | POA: Diagnosis not present

## 2017-06-16 DIAGNOSIS — Z9181 History of falling: Secondary | ICD-10-CM | POA: Diagnosis not present

## 2017-06-16 DIAGNOSIS — I509 Heart failure, unspecified: Secondary | ICD-10-CM | POA: Diagnosis not present

## 2017-06-16 DIAGNOSIS — I110 Hypertensive heart disease with heart failure: Secondary | ICD-10-CM | POA: Diagnosis not present

## 2017-06-16 DIAGNOSIS — J449 Chronic obstructive pulmonary disease, unspecified: Secondary | ICD-10-CM | POA: Diagnosis not present

## 2017-06-16 DIAGNOSIS — I69354 Hemiplegia and hemiparesis following cerebral infarction affecting left non-dominant side: Secondary | ICD-10-CM | POA: Diagnosis not present

## 2017-06-16 DIAGNOSIS — E1151 Type 2 diabetes mellitus with diabetic peripheral angiopathy without gangrene: Secondary | ICD-10-CM | POA: Diagnosis not present

## 2017-06-16 DIAGNOSIS — I2510 Atherosclerotic heart disease of native coronary artery without angina pectoris: Secondary | ICD-10-CM | POA: Diagnosis not present

## 2017-06-17 ENCOUNTER — Telehealth: Payer: Self-pay | Admitting: Family Medicine

## 2017-06-17 DIAGNOSIS — I509 Heart failure, unspecified: Secondary | ICD-10-CM | POA: Diagnosis not present

## 2017-06-17 DIAGNOSIS — J449 Chronic obstructive pulmonary disease, unspecified: Secondary | ICD-10-CM | POA: Diagnosis not present

## 2017-06-17 DIAGNOSIS — I69354 Hemiplegia and hemiparesis following cerebral infarction affecting left non-dominant side: Secondary | ICD-10-CM | POA: Diagnosis not present

## 2017-06-17 DIAGNOSIS — I2510 Atherosclerotic heart disease of native coronary artery without angina pectoris: Secondary | ICD-10-CM | POA: Diagnosis not present

## 2017-06-17 DIAGNOSIS — I110 Hypertensive heart disease with heart failure: Secondary | ICD-10-CM | POA: Diagnosis not present

## 2017-06-17 DIAGNOSIS — E1151 Type 2 diabetes mellitus with diabetic peripheral angiopathy without gangrene: Secondary | ICD-10-CM | POA: Diagnosis not present

## 2017-06-17 DIAGNOSIS — Z7902 Long term (current) use of antithrombotics/antiplatelets: Secondary | ICD-10-CM | POA: Diagnosis not present

## 2017-06-17 DIAGNOSIS — Z9181 History of falling: Secondary | ICD-10-CM | POA: Diagnosis not present

## 2017-06-17 NOTE — Telephone Encounter (Signed)
[name] from [facility] called stating he placed an order for therapy for twice (2x) a week for next, but the second appointment will be for receritification.  Please follow up

## 2017-06-18 DIAGNOSIS — E1151 Type 2 diabetes mellitus with diabetic peripheral angiopathy without gangrene: Secondary | ICD-10-CM | POA: Diagnosis not present

## 2017-06-18 DIAGNOSIS — Z7902 Long term (current) use of antithrombotics/antiplatelets: Secondary | ICD-10-CM | POA: Diagnosis not present

## 2017-06-18 DIAGNOSIS — I509 Heart failure, unspecified: Secondary | ICD-10-CM | POA: Diagnosis not present

## 2017-06-18 DIAGNOSIS — I110 Hypertensive heart disease with heart failure: Secondary | ICD-10-CM | POA: Diagnosis not present

## 2017-06-18 DIAGNOSIS — Z9181 History of falling: Secondary | ICD-10-CM | POA: Diagnosis not present

## 2017-06-18 DIAGNOSIS — I2510 Atherosclerotic heart disease of native coronary artery without angina pectoris: Secondary | ICD-10-CM | POA: Diagnosis not present

## 2017-06-18 DIAGNOSIS — I69354 Hemiplegia and hemiparesis following cerebral infarction affecting left non-dominant side: Secondary | ICD-10-CM | POA: Diagnosis not present

## 2017-06-18 DIAGNOSIS — J449 Chronic obstructive pulmonary disease, unspecified: Secondary | ICD-10-CM | POA: Diagnosis not present

## 2017-06-18 NOTE — Telephone Encounter (Signed)
[name] from [facility] called stating he placed an order for therapy for twice (2x) a week for next, but the second appointment will be for receritification. Needs verbal orders.   Please follow up

## 2017-06-21 DIAGNOSIS — I110 Hypertensive heart disease with heart failure: Secondary | ICD-10-CM | POA: Diagnosis not present

## 2017-06-21 DIAGNOSIS — I509 Heart failure, unspecified: Secondary | ICD-10-CM | POA: Diagnosis not present

## 2017-06-21 DIAGNOSIS — J449 Chronic obstructive pulmonary disease, unspecified: Secondary | ICD-10-CM | POA: Diagnosis not present

## 2017-06-21 DIAGNOSIS — E1151 Type 2 diabetes mellitus with diabetic peripheral angiopathy without gangrene: Secondary | ICD-10-CM | POA: Diagnosis not present

## 2017-06-21 DIAGNOSIS — Z9181 History of falling: Secondary | ICD-10-CM | POA: Diagnosis not present

## 2017-06-21 DIAGNOSIS — Z7902 Long term (current) use of antithrombotics/antiplatelets: Secondary | ICD-10-CM | POA: Diagnosis not present

## 2017-06-21 DIAGNOSIS — I2510 Atherosclerotic heart disease of native coronary artery without angina pectoris: Secondary | ICD-10-CM | POA: Diagnosis not present

## 2017-06-21 DIAGNOSIS — I69354 Hemiplegia and hemiparesis following cerebral infarction affecting left non-dominant side: Secondary | ICD-10-CM | POA: Diagnosis not present

## 2017-06-21 NOTE — Telephone Encounter (Signed)
[name] was called and a voicemail was left informing to return phone call for verbal orders.   If sean returns phone call its ok to give verbal orders just make sure to document.

## 2017-06-22 ENCOUNTER — Telehealth: Payer: Self-pay | Admitting: Family Medicine

## 2017-06-22 DIAGNOSIS — I2510 Atherosclerotic heart disease of native coronary artery without angina pectoris: Secondary | ICD-10-CM | POA: Diagnosis not present

## 2017-06-22 DIAGNOSIS — I69354 Hemiplegia and hemiparesis following cerebral infarction affecting left non-dominant side: Secondary | ICD-10-CM | POA: Diagnosis not present

## 2017-06-22 DIAGNOSIS — Z7902 Long term (current) use of antithrombotics/antiplatelets: Secondary | ICD-10-CM | POA: Diagnosis not present

## 2017-06-22 DIAGNOSIS — Z9181 History of falling: Secondary | ICD-10-CM | POA: Diagnosis not present

## 2017-06-22 DIAGNOSIS — E1151 Type 2 diabetes mellitus with diabetic peripheral angiopathy without gangrene: Secondary | ICD-10-CM | POA: Diagnosis not present

## 2017-06-22 DIAGNOSIS — I509 Heart failure, unspecified: Secondary | ICD-10-CM | POA: Diagnosis not present

## 2017-06-22 DIAGNOSIS — J449 Chronic obstructive pulmonary disease, unspecified: Secondary | ICD-10-CM | POA: Diagnosis not present

## 2017-06-22 DIAGNOSIS — I110 Hypertensive heart disease with heart failure: Secondary | ICD-10-CM | POA: Diagnosis not present

## 2017-06-22 NOTE — Telephone Encounter (Signed)
Noted  

## 2017-06-22 NOTE — Telephone Encounter (Signed)
Realitos Nation from [facility] called stating that she is discharging [name] from nursing but still has OT and PT.

## 2017-06-22 NOTE — Telephone Encounter (Signed)
Jenna called from [facility] regarding verbal orders for his Physical Therapy   Per Angelina Sheriff it was ok to give the Verbal order   Call back number [phone]

## 2017-06-23 ENCOUNTER — Telehealth: Payer: Self-pay | Admitting: Family Medicine

## 2017-06-23 DIAGNOSIS — E1151 Type 2 diabetes mellitus with diabetic peripheral angiopathy without gangrene: Secondary | ICD-10-CM | POA: Diagnosis not present

## 2017-06-23 DIAGNOSIS — Z7902 Long term (current) use of antithrombotics/antiplatelets: Secondary | ICD-10-CM | POA: Diagnosis not present

## 2017-06-23 DIAGNOSIS — J449 Chronic obstructive pulmonary disease, unspecified: Secondary | ICD-10-CM | POA: Diagnosis not present

## 2017-06-23 DIAGNOSIS — Z9181 History of falling: Secondary | ICD-10-CM | POA: Diagnosis not present

## 2017-06-23 DIAGNOSIS — I2510 Atherosclerotic heart disease of native coronary artery without angina pectoris: Secondary | ICD-10-CM | POA: Diagnosis not present

## 2017-06-23 DIAGNOSIS — I69354 Hemiplegia and hemiparesis following cerebral infarction affecting left non-dominant side: Secondary | ICD-10-CM | POA: Diagnosis not present

## 2017-06-23 DIAGNOSIS — I509 Heart failure, unspecified: Secondary | ICD-10-CM | POA: Diagnosis not present

## 2017-06-23 DIAGNOSIS — I110 Hypertensive heart disease with heart failure: Secondary | ICD-10-CM | POA: Diagnosis not present

## 2017-06-23 NOTE — Telephone Encounter (Signed)
occupational therapist Sharyn Lull from [facility] called to request verbal orders for occupational therapy 2 times a week for 3 weeks. Please follow up [phone]

## 2017-06-23 NOTE — Telephone Encounter (Signed)
Sharyn Lull was called and given verbal orders for OT.

## 2017-06-25 DIAGNOSIS — R42 Dizziness and giddiness: Secondary | ICD-10-CM | POA: Diagnosis not present

## 2017-06-28 DIAGNOSIS — J449 Chronic obstructive pulmonary disease, unspecified: Secondary | ICD-10-CM | POA: Diagnosis not present

## 2017-06-28 DIAGNOSIS — I110 Hypertensive heart disease with heart failure: Secondary | ICD-10-CM | POA: Diagnosis not present

## 2017-06-28 DIAGNOSIS — I2510 Atherosclerotic heart disease of native coronary artery without angina pectoris: Secondary | ICD-10-CM | POA: Diagnosis not present

## 2017-06-28 DIAGNOSIS — E1151 Type 2 diabetes mellitus with diabetic peripheral angiopathy without gangrene: Secondary | ICD-10-CM | POA: Diagnosis not present

## 2017-06-28 DIAGNOSIS — Z7902 Long term (current) use of antithrombotics/antiplatelets: Secondary | ICD-10-CM | POA: Diagnosis not present

## 2017-06-28 DIAGNOSIS — I509 Heart failure, unspecified: Secondary | ICD-10-CM | POA: Diagnosis not present

## 2017-06-28 DIAGNOSIS — I69354 Hemiplegia and hemiparesis following cerebral infarction affecting left non-dominant side: Secondary | ICD-10-CM | POA: Diagnosis not present

## 2017-06-28 DIAGNOSIS — Z9181 History of falling: Secondary | ICD-10-CM | POA: Diagnosis not present

## 2017-06-29 ENCOUNTER — Other Ambulatory Visit: Payer: Self-pay | Admitting: Family Medicine

## 2017-06-29 DIAGNOSIS — M62838 Other muscle spasm: Secondary | ICD-10-CM

## 2017-06-30 DIAGNOSIS — Z9181 History of falling: Secondary | ICD-10-CM | POA: Diagnosis not present

## 2017-06-30 DIAGNOSIS — Z7902 Long term (current) use of antithrombotics/antiplatelets: Secondary | ICD-10-CM | POA: Diagnosis not present

## 2017-06-30 DIAGNOSIS — J449 Chronic obstructive pulmonary disease, unspecified: Secondary | ICD-10-CM | POA: Diagnosis not present

## 2017-06-30 DIAGNOSIS — I69354 Hemiplegia and hemiparesis following cerebral infarction affecting left non-dominant side: Secondary | ICD-10-CM | POA: Diagnosis not present

## 2017-06-30 DIAGNOSIS — I2510 Atherosclerotic heart disease of native coronary artery without angina pectoris: Secondary | ICD-10-CM | POA: Diagnosis not present

## 2017-06-30 DIAGNOSIS — I509 Heart failure, unspecified: Secondary | ICD-10-CM | POA: Diagnosis not present

## 2017-06-30 DIAGNOSIS — I110 Hypertensive heart disease with heart failure: Secondary | ICD-10-CM | POA: Diagnosis not present

## 2017-06-30 DIAGNOSIS — E1151 Type 2 diabetes mellitus with diabetic peripheral angiopathy without gangrene: Secondary | ICD-10-CM | POA: Diagnosis not present

## 2017-07-01 DIAGNOSIS — J449 Chronic obstructive pulmonary disease, unspecified: Secondary | ICD-10-CM | POA: Diagnosis not present

## 2017-07-06 DIAGNOSIS — I110 Hypertensive heart disease with heart failure: Secondary | ICD-10-CM | POA: Diagnosis not present

## 2017-07-06 DIAGNOSIS — Z7902 Long term (current) use of antithrombotics/antiplatelets: Secondary | ICD-10-CM | POA: Diagnosis not present

## 2017-07-06 DIAGNOSIS — Z9181 History of falling: Secondary | ICD-10-CM | POA: Diagnosis not present

## 2017-07-06 DIAGNOSIS — I509 Heart failure, unspecified: Secondary | ICD-10-CM | POA: Diagnosis not present

## 2017-07-06 DIAGNOSIS — I2510 Atherosclerotic heart disease of native coronary artery without angina pectoris: Secondary | ICD-10-CM | POA: Diagnosis not present

## 2017-07-06 DIAGNOSIS — I69354 Hemiplegia and hemiparesis following cerebral infarction affecting left non-dominant side: Secondary | ICD-10-CM | POA: Diagnosis not present

## 2017-07-06 DIAGNOSIS — J449 Chronic obstructive pulmonary disease, unspecified: Secondary | ICD-10-CM | POA: Diagnosis not present

## 2017-07-06 DIAGNOSIS — E1151 Type 2 diabetes mellitus with diabetic peripheral angiopathy without gangrene: Secondary | ICD-10-CM | POA: Diagnosis not present

## 2017-07-07 DIAGNOSIS — I509 Heart failure, unspecified: Secondary | ICD-10-CM | POA: Diagnosis not present

## 2017-07-07 DIAGNOSIS — I2510 Atherosclerotic heart disease of native coronary artery without angina pectoris: Secondary | ICD-10-CM | POA: Diagnosis not present

## 2017-07-07 DIAGNOSIS — E1151 Type 2 diabetes mellitus with diabetic peripheral angiopathy without gangrene: Secondary | ICD-10-CM | POA: Diagnosis not present

## 2017-07-07 DIAGNOSIS — Z9181 History of falling: Secondary | ICD-10-CM | POA: Diagnosis not present

## 2017-07-07 DIAGNOSIS — Z7902 Long term (current) use of antithrombotics/antiplatelets: Secondary | ICD-10-CM | POA: Diagnosis not present

## 2017-07-07 DIAGNOSIS — J449 Chronic obstructive pulmonary disease, unspecified: Secondary | ICD-10-CM | POA: Diagnosis not present

## 2017-07-07 DIAGNOSIS — I110 Hypertensive heart disease with heart failure: Secondary | ICD-10-CM | POA: Diagnosis not present

## 2017-07-07 DIAGNOSIS — I69354 Hemiplegia and hemiparesis following cerebral infarction affecting left non-dominant side: Secondary | ICD-10-CM | POA: Diagnosis not present

## 2017-07-08 ENCOUNTER — Telehealth: Payer: Self-pay

## 2017-07-08 DIAGNOSIS — E1151 Type 2 diabetes mellitus with diabetic peripheral angiopathy without gangrene: Secondary | ICD-10-CM | POA: Diagnosis not present

## 2017-07-08 DIAGNOSIS — Z9181 History of falling: Secondary | ICD-10-CM | POA: Diagnosis not present

## 2017-07-08 DIAGNOSIS — I110 Hypertensive heart disease with heart failure: Secondary | ICD-10-CM | POA: Diagnosis not present

## 2017-07-08 DIAGNOSIS — I69354 Hemiplegia and hemiparesis following cerebral infarction affecting left non-dominant side: Secondary | ICD-10-CM | POA: Diagnosis not present

## 2017-07-08 DIAGNOSIS — I2510 Atherosclerotic heart disease of native coronary artery without angina pectoris: Secondary | ICD-10-CM | POA: Diagnosis not present

## 2017-07-08 DIAGNOSIS — Z7902 Long term (current) use of antithrombotics/antiplatelets: Secondary | ICD-10-CM | POA: Diagnosis not present

## 2017-07-08 DIAGNOSIS — I509 Heart failure, unspecified: Secondary | ICD-10-CM | POA: Diagnosis not present

## 2017-07-08 DIAGNOSIS — J449 Chronic obstructive pulmonary disease, unspecified: Secondary | ICD-10-CM | POA: Diagnosis not present

## 2017-07-08 NOTE — Telephone Encounter (Signed)
Will route to PCP 

## 2017-07-09 NOTE — Telephone Encounter (Signed)
VO for the following:  [place]

## 2017-07-12 DIAGNOSIS — H353131 Nonexudative age-related macular degeneration, bilateral, early dry stage: Secondary | ICD-10-CM | POA: Diagnosis not present

## 2017-07-12 DIAGNOSIS — E1151 Type 2 diabetes mellitus with diabetic peripheral angiopathy without gangrene: Secondary | ICD-10-CM | POA: Diagnosis not present

## 2017-07-12 DIAGNOSIS — J449 Chronic obstructive pulmonary disease, unspecified: Secondary | ICD-10-CM | POA: Diagnosis not present

## 2017-07-12 DIAGNOSIS — I110 Hypertensive heart disease with heart failure: Secondary | ICD-10-CM | POA: Diagnosis not present

## 2017-07-12 DIAGNOSIS — Z9181 History of falling: Secondary | ICD-10-CM | POA: Diagnosis not present

## 2017-07-12 DIAGNOSIS — I2510 Atherosclerotic heart disease of native coronary artery without angina pectoris: Secondary | ICD-10-CM | POA: Diagnosis not present

## 2017-07-12 DIAGNOSIS — I509 Heart failure, unspecified: Secondary | ICD-10-CM | POA: Diagnosis not present

## 2017-07-12 DIAGNOSIS — Z7902 Long term (current) use of antithrombotics/antiplatelets: Secondary | ICD-10-CM | POA: Diagnosis not present

## 2017-07-12 DIAGNOSIS — I69354 Hemiplegia and hemiparesis following cerebral infarction affecting left non-dominant side: Secondary | ICD-10-CM | POA: Diagnosis not present

## 2017-07-14 DIAGNOSIS — E1151 Type 2 diabetes mellitus with diabetic peripheral angiopathy without gangrene: Secondary | ICD-10-CM | POA: Diagnosis not present

## 2017-07-14 DIAGNOSIS — Z9181 History of falling: Secondary | ICD-10-CM | POA: Diagnosis not present

## 2017-07-14 DIAGNOSIS — I69354 Hemiplegia and hemiparesis following cerebral infarction affecting left non-dominant side: Secondary | ICD-10-CM | POA: Diagnosis not present

## 2017-07-14 DIAGNOSIS — I110 Hypertensive heart disease with heart failure: Secondary | ICD-10-CM | POA: Diagnosis not present

## 2017-07-14 DIAGNOSIS — I509 Heart failure, unspecified: Secondary | ICD-10-CM | POA: Diagnosis not present

## 2017-07-14 DIAGNOSIS — J449 Chronic obstructive pulmonary disease, unspecified: Secondary | ICD-10-CM | POA: Diagnosis not present

## 2017-07-14 DIAGNOSIS — I2510 Atherosclerotic heart disease of native coronary artery without angina pectoris: Secondary | ICD-10-CM | POA: Diagnosis not present

## 2017-07-14 DIAGNOSIS — Z7902 Long term (current) use of antithrombotics/antiplatelets: Secondary | ICD-10-CM | POA: Diagnosis not present

## 2017-07-16 DIAGNOSIS — Z9181 History of falling: Secondary | ICD-10-CM | POA: Diagnosis not present

## 2017-07-16 DIAGNOSIS — Z7902 Long term (current) use of antithrombotics/antiplatelets: Secondary | ICD-10-CM | POA: Diagnosis not present

## 2017-07-16 DIAGNOSIS — E1151 Type 2 diabetes mellitus with diabetic peripheral angiopathy without gangrene: Secondary | ICD-10-CM | POA: Diagnosis not present

## 2017-07-16 DIAGNOSIS — I69354 Hemiplegia and hemiparesis following cerebral infarction affecting left non-dominant side: Secondary | ICD-10-CM | POA: Diagnosis not present

## 2017-07-16 DIAGNOSIS — I110 Hypertensive heart disease with heart failure: Secondary | ICD-10-CM | POA: Diagnosis not present

## 2017-07-16 DIAGNOSIS — J449 Chronic obstructive pulmonary disease, unspecified: Secondary | ICD-10-CM | POA: Diagnosis not present

## 2017-07-16 DIAGNOSIS — I2510 Atherosclerotic heart disease of native coronary artery without angina pectoris: Secondary | ICD-10-CM | POA: Diagnosis not present

## 2017-07-16 DIAGNOSIS — I509 Heart failure, unspecified: Secondary | ICD-10-CM | POA: Diagnosis not present

## 2017-07-18 IMAGING — CR DG CHEST 1V PORT
1 series · 1 of 1 positions shown · non-contrast
Comparison: 02/06/2015 and 08/19/2014 chest x-ray.

CLINICAL DATA: 65-year-old male with hypotension. History of
asthma, COPD and hypertension/ diabetes. Smoker. Initial encounter.

EXAM:
PORTABLE CHEST 1 VIEW

[AP]
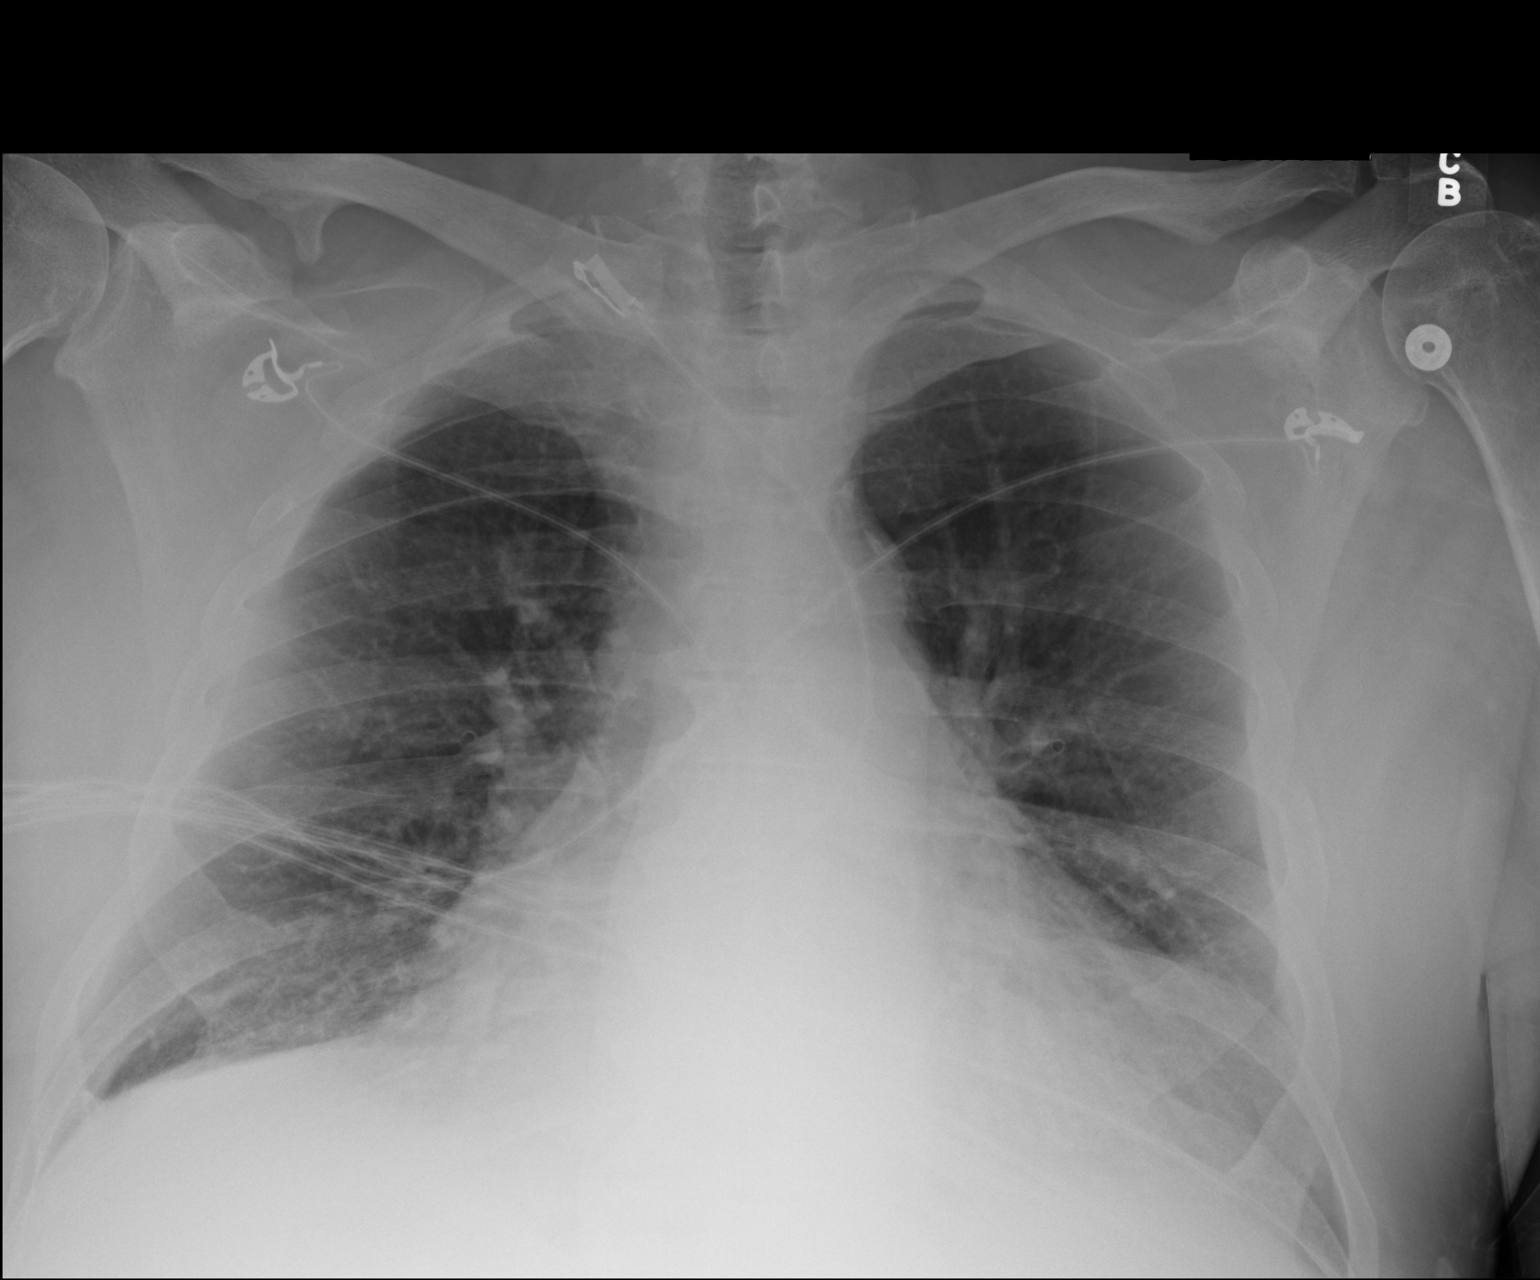

[1 of 1 positions shown; findings below may reference images not displayed]

FINDINGS: Cardiomegaly.

Central pulmonary vascular prominence without pulmonary edema.

Minimal peribronchial thickening.

No segmental consolidation or plain film evidence of pulmonary
malignancy although evaluation limited by portable technique and
slightly poor inspiration.

Aortic arch partially calcified.

Right carotid stent in place.  Left carotid calcifications.

Acromioclavicular joint degenerative changes.
IMPRESSION: Cardiomegaly.

No acute pulmonary abnormality noted on this portable exam as noted
above.

## 2017-07-19 IMAGING — DX DG CHEST 2V
2 series · 2 of 2 positions shown · non-contrast
Comparison: October 15, 2015 and February 06, 2015

CLINICAL DATA: Shortness of Breath

EXAM:
CHEST  2 VIEW

[chest pa]
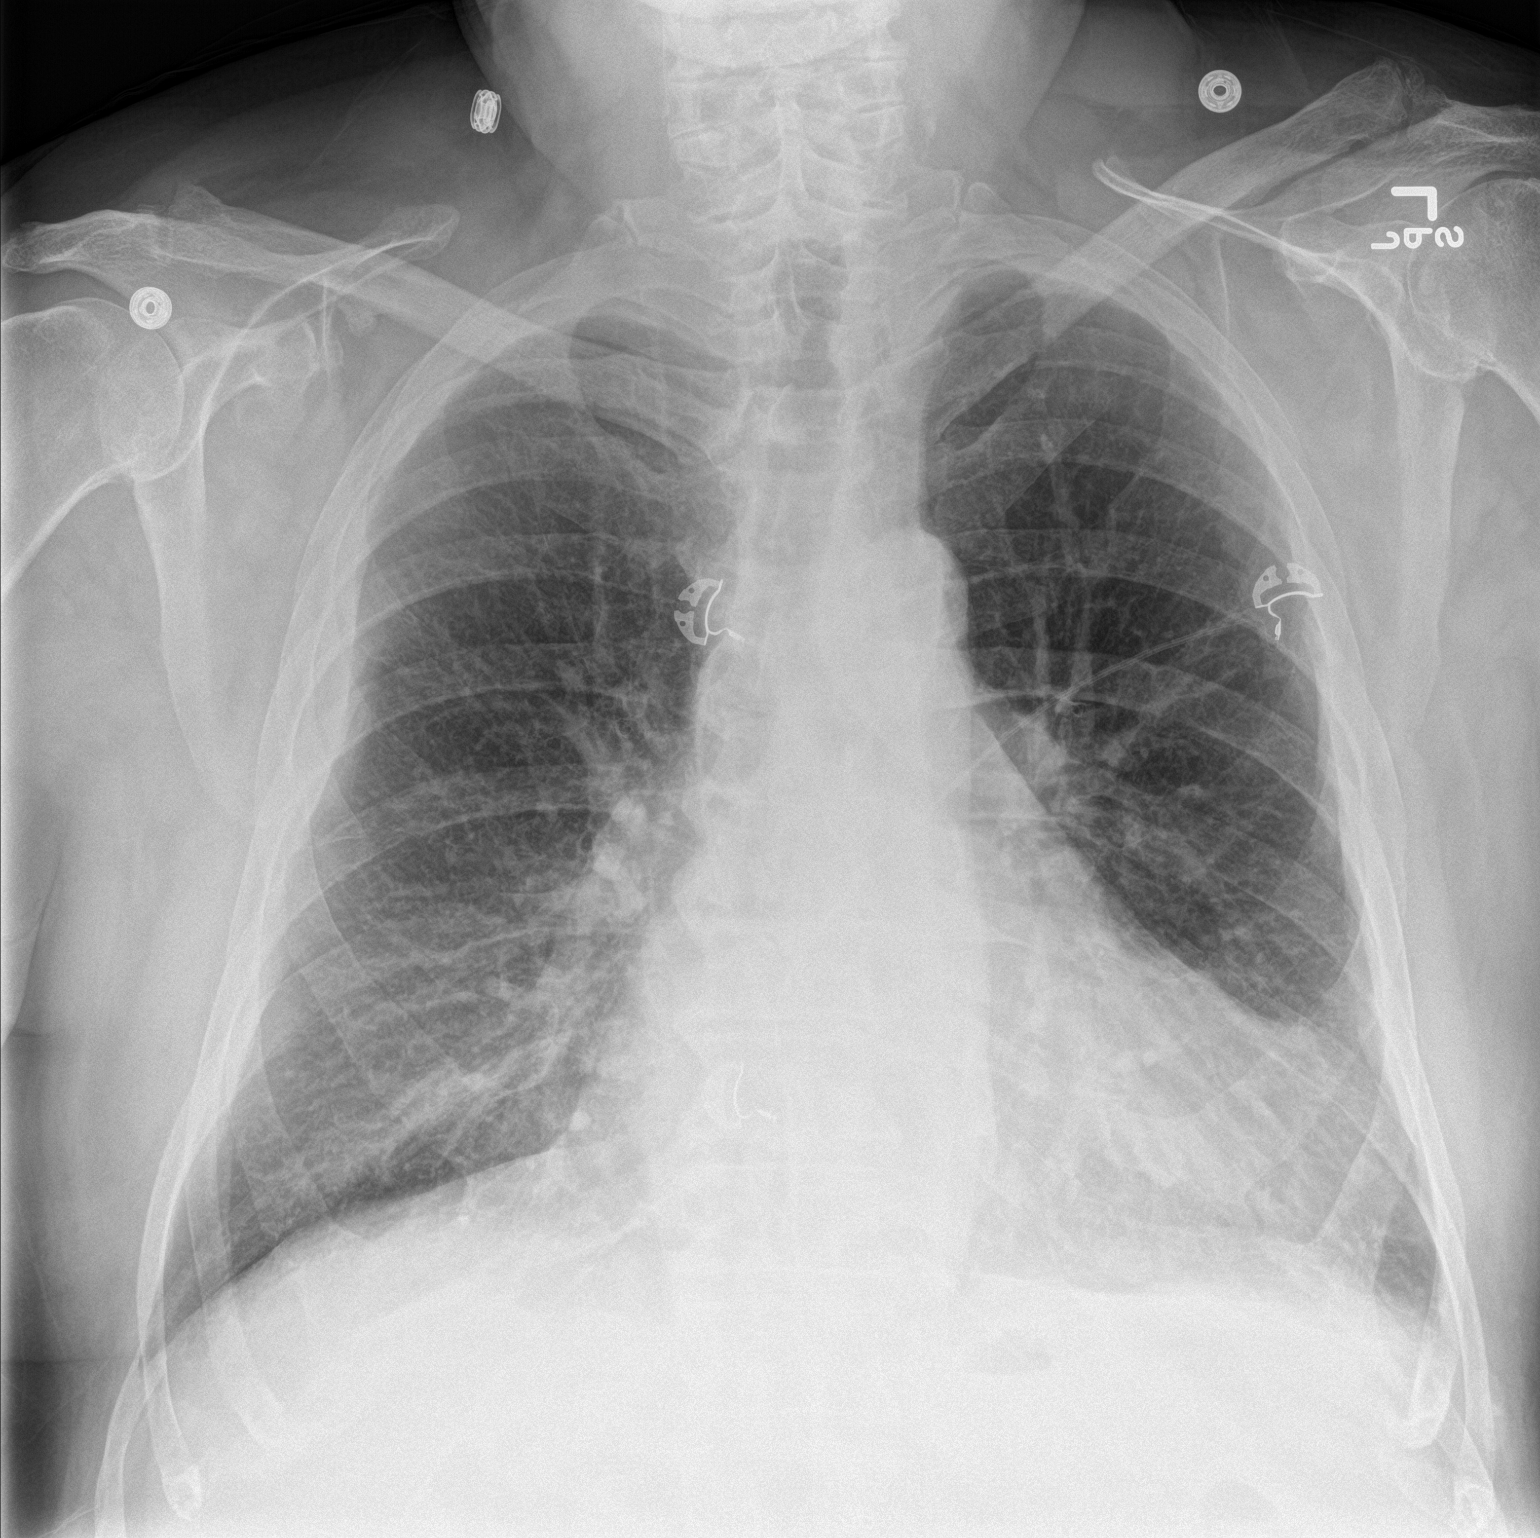

[chest lat]
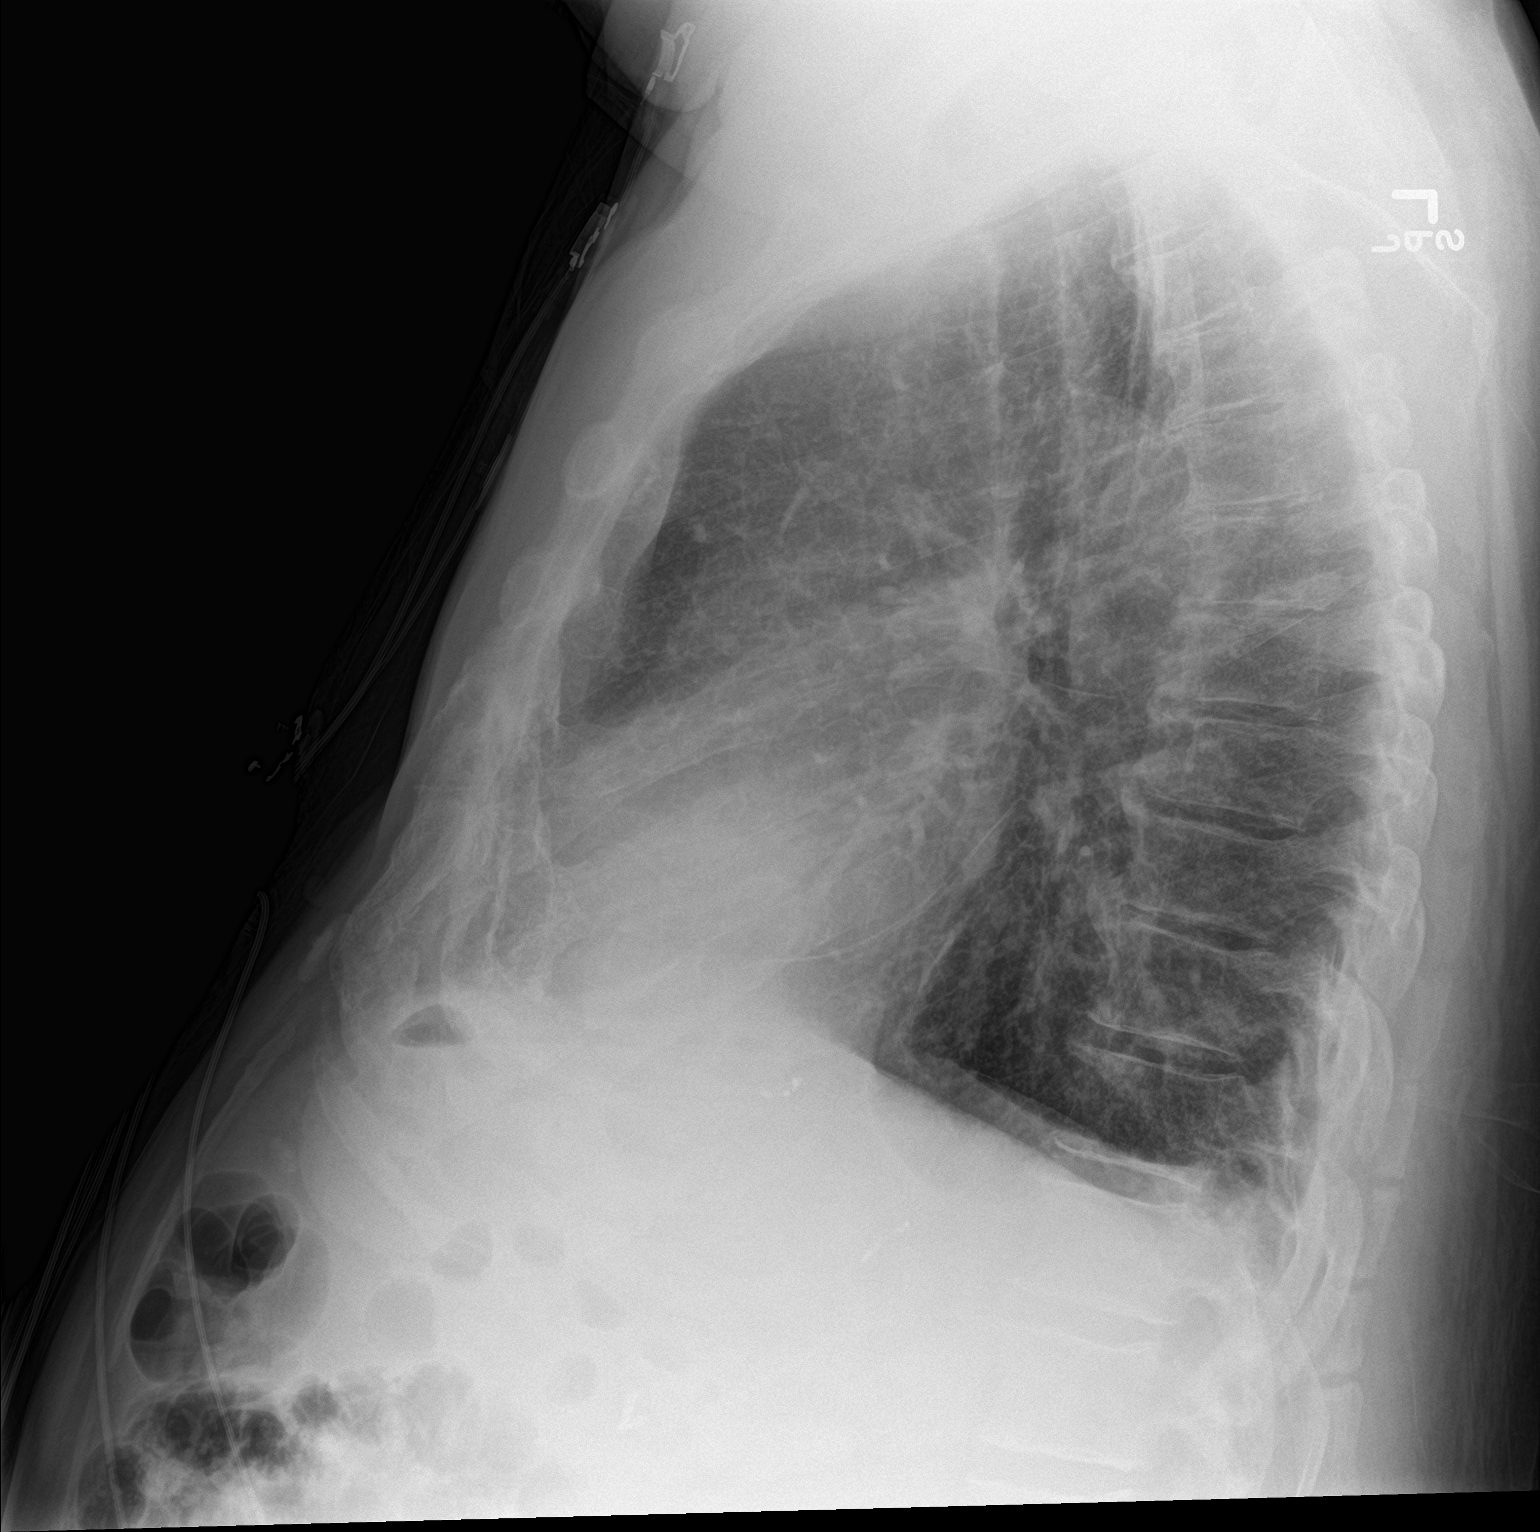

[2 of 2 positions shown; findings below may reference images not displayed]

FINDINGS: Lungs are hyperexpanded. There is mild interstitial thickening in
the mid and lower lung zones, chronic and stable. There is no frank
edema or consolidation. Heart is borderline prominent with pulmonary
vascular within normal limits, stable. There is atherosclerotic
calcification aorta. No adenopathy. There is degenerative change in
the thoracic spine and each shoulder.
IMPRESSION: Lungs hyperexpanded with chronic interstitial thickening, likely due
to chronic inflammatory type change. No frank edema or
consolidation. Stable cardiac silhouette. There is aortic
atherosclerosis.

## 2017-07-20 ENCOUNTER — Telehealth: Payer: Self-pay

## 2017-07-20 NOTE — Telephone Encounter (Signed)
Message received noting patient's caregiver, [name] is requesting a call back.   Call returned to [place]. She explained that she brought the patient to the clinic today thinking that he had an appointment today but she found out when she got to the clinic that his appointment is 08/19/17 not today and it had not been changed/re-scheduled.  She explained the difficulties that she has getting the patient to the clinic.  She did not request that the front office staff ask the provider if the patient could be seen today.   This CM apologized for the inconvenience and instructed her, if this ever happened again,  to request that the provider be consulted to see if the patient could be seen  even if he was not on the schedule as they were already in the clinic.  She stated that she understood and would do that in the future. This CM then offered to check for an appointment before 08/19/17 but [name] said that this CM did not need to check. This CM then instructed her to call the clinic if she re-considers and would like to schedule an appointment for the patient prior to 08/19/17.    Dr Lulu Riding [facility] Front office Team Lead and Games developer - [facility] scheduler were notified of [name]'s experience

## 2017-07-21 DIAGNOSIS — Z9181 History of falling: Secondary | ICD-10-CM | POA: Diagnosis not present

## 2017-07-21 DIAGNOSIS — I69354 Hemiplegia and hemiparesis following cerebral infarction affecting left non-dominant side: Secondary | ICD-10-CM | POA: Diagnosis not present

## 2017-07-21 DIAGNOSIS — I2510 Atherosclerotic heart disease of native coronary artery without angina pectoris: Secondary | ICD-10-CM | POA: Diagnosis not present

## 2017-07-21 DIAGNOSIS — I509 Heart failure, unspecified: Secondary | ICD-10-CM | POA: Diagnosis not present

## 2017-07-21 DIAGNOSIS — Z7902 Long term (current) use of antithrombotics/antiplatelets: Secondary | ICD-10-CM | POA: Diagnosis not present

## 2017-07-21 DIAGNOSIS — E1151 Type 2 diabetes mellitus with diabetic peripheral angiopathy without gangrene: Secondary | ICD-10-CM | POA: Diagnosis not present

## 2017-07-21 DIAGNOSIS — I110 Hypertensive heart disease with heart failure: Secondary | ICD-10-CM | POA: Diagnosis not present

## 2017-07-21 DIAGNOSIS — J449 Chronic obstructive pulmonary disease, unspecified: Secondary | ICD-10-CM | POA: Diagnosis not present

## 2017-07-22 DIAGNOSIS — I110 Hypertensive heart disease with heart failure: Secondary | ICD-10-CM | POA: Diagnosis not present

## 2017-07-22 DIAGNOSIS — J449 Chronic obstructive pulmonary disease, unspecified: Secondary | ICD-10-CM | POA: Diagnosis not present

## 2017-07-22 DIAGNOSIS — Z7902 Long term (current) use of antithrombotics/antiplatelets: Secondary | ICD-10-CM | POA: Diagnosis not present

## 2017-07-22 DIAGNOSIS — I69354 Hemiplegia and hemiparesis following cerebral infarction affecting left non-dominant side: Secondary | ICD-10-CM | POA: Diagnosis not present

## 2017-07-22 DIAGNOSIS — E1151 Type 2 diabetes mellitus with diabetic peripheral angiopathy without gangrene: Secondary | ICD-10-CM | POA: Diagnosis not present

## 2017-07-22 DIAGNOSIS — I509 Heart failure, unspecified: Secondary | ICD-10-CM | POA: Diagnosis not present

## 2017-07-22 DIAGNOSIS — I2510 Atherosclerotic heart disease of native coronary artery without angina pectoris: Secondary | ICD-10-CM | POA: Diagnosis not present

## 2017-07-22 DIAGNOSIS — Z9181 History of falling: Secondary | ICD-10-CM | POA: Diagnosis not present

## 2017-07-27 DIAGNOSIS — R42 Dizziness and giddiness: Secondary | ICD-10-CM | POA: Diagnosis not present

## 2017-07-27 DIAGNOSIS — E1165 Type 2 diabetes mellitus with hyperglycemia: Secondary | ICD-10-CM | POA: Diagnosis not present

## 2017-07-27 DIAGNOSIS — W19XXXA Unspecified fall, initial encounter: Secondary | ICD-10-CM | POA: Diagnosis not present

## 2017-07-27 DIAGNOSIS — R58 Hemorrhage, not elsewhere classified: Secondary | ICD-10-CM | POA: Diagnosis not present

## 2017-07-29 ENCOUNTER — Other Ambulatory Visit: Payer: Self-pay

## 2017-07-29 NOTE — Patient Outreach (Signed)
[facility]) Care Management  07/29/2017  [name] November 06, 1950 [id number]   Medication Adherence call to [name] spoke with patient he said he received a deliver from [place] on 07/28/17 for a 30 days supply patient is due on Atorvastatin 80 mg and Lisinopril 5 mg patient wants to start getting 90 days supply from pharmacy,pharmacy said they need a prescription stating 90 days supply, call doctors office and left a message for them to send in a new prescription for 90 days supply.[name] is showing past due under [place] [employer].  [facility] Management Direct Dial [phone]  Fax [phone] [name]

## 2017-07-30 ENCOUNTER — Telehealth: Payer: Self-pay | Admitting: Family Medicine

## 2017-07-30 NOTE — Telephone Encounter (Signed)
Kealya from [facility] called to let patient PCP know that patient was not seen today.

## 2017-08-02 NOTE — Telephone Encounter (Signed)
Noted will route to PCP

## 2017-08-03 ENCOUNTER — Telehealth: Payer: Self-pay | Admitting: Family Medicine

## 2017-08-03 DIAGNOSIS — Z9181 History of falling: Secondary | ICD-10-CM | POA: Diagnosis not present

## 2017-08-03 DIAGNOSIS — I2510 Atherosclerotic heart disease of native coronary artery without angina pectoris: Secondary | ICD-10-CM | POA: Diagnosis not present

## 2017-08-03 DIAGNOSIS — I509 Heart failure, unspecified: Secondary | ICD-10-CM | POA: Diagnosis not present

## 2017-08-03 DIAGNOSIS — E1151 Type 2 diabetes mellitus with diabetic peripheral angiopathy without gangrene: Secondary | ICD-10-CM | POA: Diagnosis not present

## 2017-08-03 DIAGNOSIS — I69354 Hemiplegia and hemiparesis following cerebral infarction affecting left non-dominant side: Secondary | ICD-10-CM | POA: Diagnosis not present

## 2017-08-03 DIAGNOSIS — Z7902 Long term (current) use of antithrombotics/antiplatelets: Secondary | ICD-10-CM | POA: Diagnosis not present

## 2017-08-03 DIAGNOSIS — I110 Hypertensive heart disease with heart failure: Secondary | ICD-10-CM | POA: Diagnosis not present

## 2017-08-03 DIAGNOSIS — J449 Chronic obstructive pulmonary disease, unspecified: Secondary | ICD-10-CM | POA: Diagnosis not present

## 2017-08-03 NOTE — Telephone Encounter (Signed)
2 page, paperwork received through fax 08-03-17

## 2017-08-03 NOTE — Telephone Encounter (Signed)
[name] form [facility] called to let patient PCP know that patient BP today was 90/54. Patient is complaining of dizziness and he also fell on 6/14 and the EMT told patient that he fell because on his BP being low. Please f/u

## 2017-08-03 NOTE — Telephone Encounter (Signed)
Dr.Johnson please follow up.

## 2017-08-05 ENCOUNTER — Telehealth: Payer: Self-pay | Admitting: Family Medicine

## 2017-08-05 DIAGNOSIS — I69354 Hemiplegia and hemiparesis following cerebral infarction affecting left non-dominant side: Secondary | ICD-10-CM | POA: Diagnosis not present

## 2017-08-05 DIAGNOSIS — I509 Heart failure, unspecified: Secondary | ICD-10-CM | POA: Diagnosis not present

## 2017-08-05 DIAGNOSIS — I2510 Atherosclerotic heart disease of native coronary artery without angina pectoris: Secondary | ICD-10-CM | POA: Diagnosis not present

## 2017-08-05 DIAGNOSIS — I110 Hypertensive heart disease with heart failure: Secondary | ICD-10-CM | POA: Diagnosis not present

## 2017-08-05 DIAGNOSIS — E1151 Type 2 diabetes mellitus with diabetic peripheral angiopathy without gangrene: Secondary | ICD-10-CM | POA: Diagnosis not present

## 2017-08-05 DIAGNOSIS — J449 Chronic obstructive pulmonary disease, unspecified: Secondary | ICD-10-CM | POA: Diagnosis not present

## 2017-08-05 DIAGNOSIS — Z9181 History of falling: Secondary | ICD-10-CM | POA: Diagnosis not present

## 2017-08-05 DIAGNOSIS — Z7902 Long term (current) use of antithrombotics/antiplatelets: Secondary | ICD-10-CM | POA: Diagnosis not present

## 2017-08-05 NOTE — Telephone Encounter (Signed)
2 page, paperwork received through fax 08-05-17.

## 2017-08-09 DIAGNOSIS — J449 Chronic obstructive pulmonary disease, unspecified: Secondary | ICD-10-CM | POA: Diagnosis not present

## 2017-08-10 DIAGNOSIS — I2510 Atherosclerotic heart disease of native coronary artery without angina pectoris: Secondary | ICD-10-CM | POA: Diagnosis not present

## 2017-08-10 DIAGNOSIS — Z9181 History of falling: Secondary | ICD-10-CM | POA: Diagnosis not present

## 2017-08-10 DIAGNOSIS — I69354 Hemiplegia and hemiparesis following cerebral infarction affecting left non-dominant side: Secondary | ICD-10-CM | POA: Diagnosis not present

## 2017-08-10 DIAGNOSIS — E1151 Type 2 diabetes mellitus with diabetic peripheral angiopathy without gangrene: Secondary | ICD-10-CM | POA: Diagnosis not present

## 2017-08-10 DIAGNOSIS — I110 Hypertensive heart disease with heart failure: Secondary | ICD-10-CM | POA: Diagnosis not present

## 2017-08-10 DIAGNOSIS — J449 Chronic obstructive pulmonary disease, unspecified: Secondary | ICD-10-CM | POA: Diagnosis not present

## 2017-08-10 DIAGNOSIS — Z7902 Long term (current) use of antithrombotics/antiplatelets: Secondary | ICD-10-CM | POA: Diagnosis not present

## 2017-08-10 DIAGNOSIS — I509 Heart failure, unspecified: Secondary | ICD-10-CM | POA: Diagnosis not present

## 2017-08-17 ENCOUNTER — Other Ambulatory Visit: Payer: Self-pay | Admitting: Family Medicine

## 2017-08-17 ENCOUNTER — Telehealth: Payer: Self-pay | Admitting: Family Medicine

## 2017-08-17 DIAGNOSIS — M62838 Other muscle spasm: Secondary | ICD-10-CM

## 2017-08-17 NOTE — Telephone Encounter (Signed)
[name] form [facility] called to let patient PCP know that patient refused the home visit.

## 2017-08-18 NOTE — Telephone Encounter (Signed)
Noted. Will route to PCP

## 2017-08-18 NOTE — Telephone Encounter (Signed)
Noted  

## 2017-08-19 ENCOUNTER — Encounter: Payer: Self-pay | Admitting: Family Medicine

## 2017-08-19 ENCOUNTER — Ambulatory Visit: Payer: Medicare Other | Attending: Family Medicine | Admitting: Family Medicine

## 2017-08-19 VITALS — BP 119/72 | HR 71 | Temp 97.7°F | Wt 222.6 lb

## 2017-08-19 DIAGNOSIS — G8929 Other chronic pain: Secondary | ICD-10-CM | POA: Diagnosis not present

## 2017-08-19 DIAGNOSIS — I110 Hypertensive heart disease with heart failure: Secondary | ICD-10-CM | POA: Diagnosis not present

## 2017-08-19 DIAGNOSIS — Z79899 Other long term (current) drug therapy: Secondary | ICD-10-CM | POA: Diagnosis not present

## 2017-08-19 DIAGNOSIS — F0280 Dementia in other diseases classified elsewhere without behavioral disturbance: Secondary | ICD-10-CM | POA: Diagnosis not present

## 2017-08-19 DIAGNOSIS — M25512 Pain in left shoulder: Secondary | ICD-10-CM

## 2017-08-19 DIAGNOSIS — I6523 Occlusion and stenosis of bilateral carotid arteries: Secondary | ICD-10-CM | POA: Diagnosis not present

## 2017-08-19 DIAGNOSIS — I69354 Hemiplegia and hemiparesis following cerebral infarction affecting left non-dominant side: Secondary | ICD-10-CM | POA: Diagnosis not present

## 2017-08-19 DIAGNOSIS — I509 Heart failure, unspecified: Secondary | ICD-10-CM | POA: Diagnosis not present

## 2017-08-19 DIAGNOSIS — M62838 Other muscle spasm: Secondary | ICD-10-CM | POA: Diagnosis not present

## 2017-08-19 DIAGNOSIS — E7800 Pure hypercholesterolemia, unspecified: Secondary | ICD-10-CM | POA: Diagnosis not present

## 2017-08-19 DIAGNOSIS — Z7902 Long term (current) use of antithrombotics/antiplatelets: Secondary | ICD-10-CM | POA: Diagnosis not present

## 2017-08-19 DIAGNOSIS — F329 Major depressive disorder, single episode, unspecified: Secondary | ICD-10-CM | POA: Insufficient documentation

## 2017-08-19 DIAGNOSIS — K219 Gastro-esophageal reflux disease without esophagitis: Secondary | ICD-10-CM | POA: Insufficient documentation

## 2017-08-19 DIAGNOSIS — E1149 Type 2 diabetes mellitus with other diabetic neurological complication: Secondary | ICD-10-CM | POA: Diagnosis not present

## 2017-08-19 DIAGNOSIS — M1000 Idiopathic gout, unspecified site: Secondary | ICD-10-CM | POA: Diagnosis not present

## 2017-08-19 DIAGNOSIS — G4709 Other insomnia: Secondary | ICD-10-CM

## 2017-08-19 DIAGNOSIS — I252 Old myocardial infarction: Secondary | ICD-10-CM | POA: Insufficient documentation

## 2017-08-19 DIAGNOSIS — G4733 Obstructive sleep apnea (adult) (pediatric): Secondary | ICD-10-CM | POA: Diagnosis not present

## 2017-08-19 DIAGNOSIS — R55 Syncope and collapse: Secondary | ICD-10-CM | POA: Diagnosis not present

## 2017-08-19 DIAGNOSIS — I2510 Atherosclerotic heart disease of native coronary artery without angina pectoris: Secondary | ICD-10-CM | POA: Insufficient documentation

## 2017-08-19 DIAGNOSIS — Z1159 Encounter for screening for other viral diseases: Secondary | ICD-10-CM | POA: Diagnosis not present

## 2017-08-19 DIAGNOSIS — F419 Anxiety disorder, unspecified: Secondary | ICD-10-CM | POA: Insufficient documentation

## 2017-08-19 DIAGNOSIS — I10 Essential (primary) hypertension: Secondary | ICD-10-CM

## 2017-08-19 DIAGNOSIS — G4700 Insomnia, unspecified: Secondary | ICD-10-CM | POA: Insufficient documentation

## 2017-08-19 DIAGNOSIS — Z1211 Encounter for screening for malignant neoplasm of colon: Secondary | ICD-10-CM

## 2017-08-19 DIAGNOSIS — E1151 Type 2 diabetes mellitus with diabetic peripheral angiopathy without gangrene: Secondary | ICD-10-CM | POA: Insufficient documentation

## 2017-08-19 DIAGNOSIS — I6389 Other cerebral infarction: Secondary | ICD-10-CM

## 2017-08-19 DIAGNOSIS — Z86718 Personal history of other venous thrombosis and embolism: Secondary | ICD-10-CM | POA: Diagnosis not present

## 2017-08-19 DIAGNOSIS — Z9181 History of falling: Secondary | ICD-10-CM | POA: Diagnosis not present

## 2017-08-19 DIAGNOSIS — J449 Chronic obstructive pulmonary disease, unspecified: Secondary | ICD-10-CM | POA: Diagnosis not present

## 2017-08-19 MED ORDER — LISINOPRIL 5 MG PO TABS: 5.0000 mg | ORAL_TABLET | Freq: Every day | ORAL | 5 refills | Status: DC

## 2017-08-19 MED ORDER — CLOPIDOGREL BISULFATE 75 MG PO TABS: 75.0000 mg | ORAL_TABLET | Freq: Every day | ORAL | 3 refills | Status: DC

## 2017-08-19 MED ORDER — ALLOPURINOL 300 MG PO TABS: 300.0000 mg | ORAL_TABLET | Freq: Every day | ORAL | 3 refills | Status: DC

## 2017-08-19 MED ORDER — LACTULOSE 10 GM/15ML PO SOLN: 10.0000 g | Freq: Two times a day (BID) | ORAL | 1 refills | Status: DC | PRN

## 2017-08-19 MED ORDER — FUROSEMIDE 20 MG PO TABS: 40.0000 mg | ORAL_TABLET | Freq: Two times a day (BID) | ORAL | 3 refills | Status: DC

## 2017-08-19 MED ORDER — CARVEDILOL 12.5 MG PO TABS: 12.5000 mg | ORAL_TABLET | Freq: Two times a day (BID) | ORAL | 3 refills | Status: DC

## 2017-08-19 MED ORDER — PREGABALIN 150 MG PO CAPS: 150.0000 mg | ORAL_CAPSULE | Freq: Two times a day (BID) | ORAL | 3 refills | Status: DC

## 2017-08-19 MED ORDER — TRAZODONE HCL 150 MG PO TABS: 150.0000 mg | ORAL_TABLET | Freq: Every evening | ORAL | 3 refills | Status: DC | PRN

## 2017-08-19 MED ORDER — DULOXETINE HCL 60 MG PO CPEP: 60.0000 mg | ORAL_CAPSULE | Freq: Every day | ORAL | 3 refills | Status: DC

## 2017-08-19 MED ORDER — ROLLATOR MISC: 1.0000 | Freq: Every day | 0 refills | Status: DC

## 2017-08-19 MED ORDER — MEMANTINE HCL 10 MG PO TABS: 10.0000 mg | ORAL_TABLET | Freq: Two times a day (BID) | ORAL | 3 refills | Status: DC

## 2017-08-19 MED ORDER — TIZANIDINE HCL 4 MG PO TABS: 4.0000 mg | ORAL_TABLET | Freq: Three times a day (TID) | ORAL | 3 refills | Status: DC | PRN

## 2017-08-19 MED ORDER — ALBUTEROL SULFATE HFA 108 (90 BASE) MCG/ACT IN AERS: INHALATION_SPRAY | RESPIRATORY_TRACT | 3 refills | Status: DC

## 2017-08-19 MED ORDER — ROPINIROLE HCL 0.5 MG PO TABS: 0.5000 mg | ORAL_TABLET | Freq: Every day | ORAL | 3 refills | Status: DC

## 2017-08-19 MED ORDER — FLUTICASONE-SALMETEROL 250-50 MCG/DOSE IN AEPB: 1.0000 | INHALATION_SPRAY | Freq: Two times a day (BID) | RESPIRATORY_TRACT | 3 refills | Status: DC

## 2017-08-19 MED ORDER — ATORVASTATIN CALCIUM 80 MG PO TABS: 80.0000 mg | ORAL_TABLET | Freq: Every day | ORAL | 3 refills | Status: DC

## 2017-08-19 NOTE — Patient Instructions (Signed)
Edema Edema is when you have too much fluid in your body or under your skin. Edema may make your legs, feet, and ankles swell up. Swelling is also common in looser tissues, like around your eyes. This is a common condition. It gets more common as you get older. There are many possible causes of edema. Eating too much salt (sodium) and being on your feet or sitting for a long time can cause edema in your legs, feet, and ankles. Hot weather may make edema worse. Edema is usually painless. Your skin may look swollen or shiny. Follow these instructions at home:  Keep the swollen body part raised (elevated) above the level of your heart when you are sitting or lying down.  Do not sit still or stand for a long time.  Do not wear tight clothes. Do not wear garters on your upper legs.  Exercise your legs. This can help the swelling go down.  Wear elastic bandages or support stockings as told by your doctor.  Eat a low-salt (low-sodium) diet to reduce fluid as told by your doctor.  Depending on the cause of your swelling, you may need to limit how much fluid you drink (fluid restriction).  Take over-the-counter and prescription medicines only as told by your doctor. Contact a doctor if:  Treatment is not working.  You have heart, liver, or kidney disease and have symptoms of edema.  You have sudden and unexplained weight gain. Get help right away if:  You have shortness of breath or chest pain.  You cannot breathe when you lie down.  You have pain, redness, or warmth in the swollen areas.  You have heart, liver, or kidney disease and get edema all of a sudden.  You have a fever and your symptoms get worse all of a sudden. Summary  Edema is when you have too much fluid in your body or under your skin.  Edema may make your legs, feet, and ankles swell up. Swelling is also common in looser tissues, like around your eyes.  Raise (elevate) the swollen body part above the level of your  heart when you are sitting or lying down.  Follow your doctor's instructions about diet and how much fluid you can drink (fluid restriction). This information is not intended to replace advice given to you by your health care provider. Make sure you discuss any questions you have with your health care provider. Document Released: 07/15/2007 Document Revised: 02/14/2016 Document Reviewed: 02/14/2016 Elsevier Interactive Patient Education  2017 [employer]  

## 2017-08-19 NOTE — Progress Notes (Signed)
Subjective:  Patient ID: [name], male    DOB: 06-07-1950  Age: 67 y.o. MRN: [id number]  CC: Hypertension   HPI [name] is a 67 year old male with a history of Diabetes mellitus (diet-controlled A1c of 5.9) HTN, CHF (EF 50-55% from 2-D echo of 07/2016), COPD, multiple previous CVAs with residual left hemiparesis, hyperlipidemia, gout, dementia,PVD, carotid stenosis (status post placement of ICA stent) here for a follow up visit. He declined further PT sessions due to ineffectiveness he states as he continues to have difficulty with range of motion of his left upper extremity which he has had since his hemiparesis as a result of his CVA.  He also endorses falls with the last one occurring 3 weeks ago.  He describes episodes as lightheadedness when he gets out of bed and as he progresses to stand from sitting position he finds himself on the floor and lacks the strength to get up.  The dose of his antihypertensives hd been reduced and his blood pressure is in the normotensive range.  He denies seizure episodes, chest pains or shortness of breath. CT head from 04/13/2017 revealed low parietal infarction, chronic ischemic white matter disease but no acute intracranial abnormality. He does have chronic bilateral pedal edema for which he uses Lasix.  Seen by vascular surgery in the past and no intervention planned.  Denies claudication pains and he continues to smoke half a pack of cigarettes per day with no plans of quitting. Denies recent COPD or gout exacerbation and his diabetes is diet controlled.  Past Medical History:  Diagnosis Date  . Alcohol abuse    H/O  . Anxiety   . Arthritis   . Asthma   . CAD (coronary artery disease) 05/27/10   Cath: severe single vessell CAD left cx midportion obtuse marginal 2 to 3.  [name] CHF (congestive heart failure) ([place])   . COPD (chronic obstructive pulmonary disease) ([place])   . COPD (chronic obstructive pulmonary disease) ([place])   . Depression    . Diabetes mellitus, type 2 ([place]) 03/13/2015  . GERD (gastroesophageal reflux disease)   . HCAP (healthcare-associated pneumonia) 10/15/2015  . History of DVT of lower extremity   . Hyperlipemia   . Hyperlipidemia   . Hypertension   . Myocardial infarction ([place]) 2000  . Orbital fracture ([place]) 12/2012  . OSA on CPAP   . Peripheral vascular disease, unspecified ([place])    08/20/10 doppler: increase in right ABI post-op. Left ABI stable. S/P bi-fem bypass surgery  . Pneumonia 04/04/2012  . Shortness of breath   . Stroke ([place])   . Tobacco abuse     Past Surgical History:  Procedure Laterality Date  . abdoninal ao angio & bifem angio  05/27/10   Patent graft, occluded bil stents with no retrograde flow into the hypogastric arteries. 100% occl left ant. tibial artery, 70% to 80% to 100% stenosis right superficial fem artery above adductor canal. 100 % occl right ant. tibial vessell  . Aortogram w/ PTCA  09/292003  . BACK SURGERY    . CARDIAC CATHETERIZATION  05/27/10   severe CAD left cx  . CHOLECYSTECTOMY    . ESOPHAGOGASTRIC FUNDOPLICATION    . EYE SURGERY    . FEMORAL BYPASS  08/19/10   Right Fem-Pop  . IR ANGIO INTRA EXTRACRAN SEL COM CAROTID INNOMINATE BILAT MOD SED  07/20/2016  . IR ANGIO VERTEBRAL SEL SUBCLAVIAN INNOMINATE UNI R MOD SED  07/20/2016  . IR ANGIO VERTEBRAL SEL  VERTEBRAL UNI L MOD SED  07/20/2016  . IR RADIOLOGIST EVAL & MGMT  05/27/2016  . JOINT REPLACEMENT    . PERIPHERAL VASCULAR CATHETERIZATION N/A 12/10/2014   Procedure: Abdominal Aortogram;  Surgeon: Angelia Mould, MD;  Location: [facility];  Service: Cardiovascular;  Laterality: N/A;  . RADIOLOGY WITH ANESTHESIA N/A 02/08/2015   Procedure: RADIOLOGY WITH ANESTHESIA;  Surgeon: Luanne Bras, MD;  Location: [facility];  Service: Radiology;  Laterality: N/A;  . TEE WITHOUT CARDIOVERSION N/A 08/29/2014   Procedure: TRANSESOPHAGEAL ECHOCARDIOGRAM (TEE);  Surgeon: Josue Hector, MD;  Location[facility]  ENDOSCOPY;  Service: Cardiovascular;  Laterality: N/A;  . TOTAL KNEE ARTHROPLASTY      Allergies  Allergen Reactions  . Darvocet [Propoxyphene N-Acetaminophen] Hives  . Haldol [Haloperidol Decanoate] Hives  . Acetaminophen Nausea Only    Upset stomach, tolerates Hydrocodone/APAP if taken with food  . Metformin And Related Nausea And Vomiting  . Norco [Hydrocodone-Acetaminophen] Nausea And Vomiting    Tolerates if taken with food     Outpatient Medications Prior to Visit  Medication Sig Dispense Refill  . ACCU-CHEK SOFTCLIX LANCETS lancets Use 3 times daily before meals 100 each 5  . albuterol (PROVENTIL) (2.5 MG/3ML) 0.083% nebulizer solution Take 3 mLs (2.5 mg total) by nebulization every 6 (six) hours as needed for wheezing or shortness of breath. 75/12=7 75 mL 0  . Blood Glucose Monitoring Suppl (ACCU-CHEK AVIVA) device Use 3 times daily before meals. 1 each 0  . glucose blood (ACCU-CHEK AVIVA) test strip Use 3 times daily before meals as directed. 100 each 12  . Lancet Devices (ACCU-CHEK SOFTCLIX) lancets Use 3 times daily before meals 1 each 5  . pantoprazole (PROTONIX) 40 MG tablet TAKE 1 TABLET BY MOUTH EVERY MORNING 30 tablet 2  . polyethylene glycol (MIRALAX / GLYCOLAX) packet Take 17 g by mouth daily. 14 each 0  . Selenium Sulfide 2.25 % SHAM Apply 1 application topically 2 (two) times a week. 180 mL 1  . triamcinolone cream (KENALOG) 0.1 % Apply 1 application topically daily as needed (for dermatitis). 30 g 0  . albuterol (PROAIR HFA) 108 (90 Base) MCG/ACT inhaler Inhale 2 puffs into the lungs every 6 (six) hours as needed for wheezing or shortness of breath. 8.5 g 3  . allopurinol (ZYLOPRIM) 300 MG tablet Take 1 tablet (300 mg total) by mouth daily. 30 tablet 3  . atorvastatin (LIPITOR) 80 MG tablet Take 1 tablet (80 mg total) by mouth daily at 6 PM. 30 tablet 3  . carvedilol (COREG) 12.5 MG tablet Take 1 tablet (12.5 mg total) by mouth 2 (two) times daily with a meal. 60  tablet 3  . clopidogrel (PLAVIX) 75 MG tablet Take 1 tablet (75 mg total) by mouth daily. 30 tablet 3  . DULoxetine (CYMBALTA) 60 MG capsule Take 1 capsule (60 mg total) by mouth daily. 30 capsule 3  . Fluticasone-Salmeterol (ADVAIR DISKUS) 250-50 MCG/DOSE AEPB Inhale 1 puff into the lungs 2 (two) times daily. 1 each 3  . furosemide (LASIX) 20 MG tablet Take 3 tablets (60 mg total) by mouth 2 (two) times daily. 180 tablet 5  . lactulose (CHRONULAC) 10 GM/15ML solution Take 15 mLs (10 g total) by mouth 2 (two) times daily as needed for mild constipation. 946 mL 2  . lisinopril (PRINIVIL,ZESTRIL) 5 MG tablet Take 1 tablet (5 mg total) by mouth daily. 30 tablet 5  . memantine (NAMENDA) 10 MG tablet Take 1 tablet (10 mg total)  by mouth 2 (two) times daily. 60 tablet 3  . Misc. Devices (ROLLATOR) MISC 1 each by Does not apply route daily. 1 each 0  . pregabalin (LYRICA) 150 MG capsule Take 1 capsule (150 mg total) by mouth 2 (two) times daily. 60 capsule 3  . rOPINIRole (REQUIP) 0.5 MG tablet Take 1 tablet (0.5 mg total) by mouth at bedtime. 30 tablet 3  . tiZANidine (ZANAFLEX) 4 MG tablet TAKE 1 TABLET BY MOUTH 3 TIMES DAILY 90 tablet 0  . traZODone (DESYREL) 150 MG tablet Take 1 tablet (150 mg total) by mouth at bedtime as needed for sleep. 30 tablet 3   No facility-administered medications prior to visit.     ROS Review of Systems  Constitutional: Negative for activity change and appetite change.  HENT: Negative for sinus pressure and sore throat.   Eyes: Negative for visual disturbance.  Respiratory: Negative for cough, chest tightness and shortness of breath.   Cardiovascular: Positive for leg swelling. Negative for chest pain.  Gastrointestinal: Negative for abdominal distention, abdominal pain, constipation and diarrhea.  Endocrine: Negative.   Genitourinary: Negative for dysuria.  Musculoskeletal:       See hpi  Skin: Negative for rash.  Allergic/Immunologic: Negative.     Neurological: Positive for weakness. Negative for light-headedness and numbness.  Psychiatric/Behavioral: Negative for dysphoric mood and suicidal ideas.    Objective:  BP 119/72   Pulse 71   Temp 97.7 F (36.5 C) (Oral)   Wt 222 lb 9.6 oz (101 kg)   SpO2 97%   BMI 30.19 kg/m   BP/Weight 08/19/2017 02/15/9676 10/12/8099  Systolic BP 751 025 852  Diastolic BP 72 81 52  Wt. (Lbs) 222.6 238 -  BMI 30.19 32.28 -     Physical Exam  Constitutional: He is oriented to person, place, and time. He appears well-developed and well-nourished.  Cardiovascular: Normal rate and normal heart sounds.  No murmur heard. Unable to palpate dorsalis pedis  Pulmonary/Chest: Effort normal and breath sounds normal. He has no wheezes. He has no rales. He exhibits no tenderness.  Abdominal: Soft. Bowel sounds are normal. He exhibits no distension and no mass. There is no tenderness.  Musculoskeletal: Normal range of motion. He exhibits edema (2+ bilateral pedal edema, edema of left dorsum).  Wasting of left biceps muscle Reduced ROM in left upper extremity  Neurological: He is alert and oriented to person, place, and time.  Skin: Skin is warm and dry.  Psychiatric: He has a normal mood and affect.     Assessment & Plan:   1. Other insomnia Controlled - traZODone (DESYREL) 150 MG tablet; Take 1 tablet (150 mg total) by mouth at bedtime as needed for sleep.  Dispense: 30 tablet; Refill: 3  2. Muscle spasm Stable - tiZANidine (ZANAFLEX) 4 MG tablet; Take 1 tablet (4 mg total) by mouth every 8 (eight) hours as needed for muscle spasms.  Dispense: 90 tablet; Refill: 3  3. Chronic obstructive pulmonary disease, unspecified COPD type ([place]) No acute exacerbations - albuterol (PROAIR HFA) 108 (90 Base) MCG/ACT inhaler; Inhale 2 puffs into the lungs every 6 (six) hours as needed for wheezing or shortness of breath.  Dispense: 8.5 g; Refill: 3 - lactulose (CHRONULAC) 10 GM/15ML solution; Take 15 mLs (10 g  total) by mouth 2 (two) times daily as needed for mild constipation.  Dispense: 236 mL; Refill: 1 - Fluticasone-Salmeterol (ADVAIR DISKUS) 250-50 MCG/DOSE AEPB; Inhale 1 puff into the lungs 2 (two) times daily.  Dispense:  1 each; Refill: 3  4. Type 2 diabetes mellitus with other neurologic complication, without long-term current use of insulin (H[facility]) Controlled - pregabalin (LYRICA) 150 MG capsule; Take 1 capsule (150 mg total) by mouth 2 (two) times daily.  Dispense: 60 capsule; Refill: 3 - Microalbumin/Creatinine Ratio, Urine  5. Cerebral infarction due to other mechanism ([place]) - Misc. Devices (ROLLATOR) MISC; 1 each by Does not apply route daily.  Dispense: 1 each; Refill: 0  6. Intracranial carotid stenosis, bilateral S/p stent - Misc. Devices (ROLLATOR) MISC; 1 each by Does not apply route daily.  Dispense: 1 each; Refill: 0  7. Dementia due to another medical condition, without behavioral disturbance Stable - memantine (NAMENDA) 10 MG tablet; Take 1 tablet (10 mg total) by mouth 2 (two) times daily.  Dispense: 60 tablet; Refill: 3  8. Essential hypertension Controlled Counseled on blood pressure goal of less than 130/80, low-sodium, DASH diet, medication compliance, 150 minutes of moderate intensity exercise per week. Discussed medication compliance, adverse effects. - CMP14+EGFR - lisinopril (PRINIVIL,ZESTRIL) 5 MG tablet; Take 1 tablet (5 mg total) by mouth daily.  Dispense: 30 tablet; Refill: 5 - carvedilol (COREG) 12.5 MG tablet; Take 1 tablet (12.5 mg total) by mouth 2 (two) times daily with a meal.  Dispense: 60 tablet; Refill: 3  9. Hemiparesis affecting left side as late effect of cerebrovascular accident ([place]) Risk factor modification - clopidogrel (PLAVIX) 75 MG tablet; Take 1 tablet (75 mg total) by mouth daily.  Dispense: 30 tablet; Refill: 3  10. Pure hypercholesterolemia Stable Low cholesterol diet - atorvastatin (LIPITOR) 80 MG tablet; Take 1 tablet (80 mg total)  by mouth daily at 6 PM.  Dispense: 30 tablet; Refill: 3  11. Idiopathic gout, unspecified chronicity, unspecified site No acute exacerbations - allopurinol (ZYLOPRIM) 300 MG tablet; Take 1 tablet (300 mg total) by mouth daily.  Dispense: 30 tablet; Refill: 3  12. Syncope, unspecified syncope type History of ICA stenosis status post stent Undetermined etiology; blood pressure has been holding well - Ambulatory referral to Cardiology - US Carotid Duplex Bilateral; Future  13. Chronic left shoulder pain Due to left hemiparesis PT has been ineffective Will refer to orthopedics - AMB referral to orthopedics - DG Shoulder Left; Future  14. Need for hepatitis C screening test - Hepatitis c antibody (reflex)  15. Screening for colon cancer He is high risk for colonoscopy - Fecal occult blood, imunochemical; Future   Meds ordered this encounter  Medications  . traZODone (DESYREL) 150 MG tablet    Sig: Take 1 tablet (150 mg total) by mouth at bedtime as needed for sleep.    Dispense:  30 tablet    Refill:  3  . rOPINIRole (REQUIP) 0.5 MG tablet    Sig: Take 1 tablet (0.5 mg total) by mouth at bedtime.    Dispense:  30 tablet    Refill:  3  . tiZANidine (ZANAFLEX) 4 MG tablet    Sig: Take 1 tablet (4 mg total) by mouth every 8 (eight) hours as needed for muscle spasms.    Dispense:  90 tablet    Refill:  3  . albuterol (PROAIR HFA) 108 (90 Base) MCG/ACT inhaler    Sig: Inhale 2 puffs into the lungs every 6 (six) hours as needed for wheezing or shortness of breath.    Dispense:  8.5 g    Refill:  3  . pregabalin (LYRICA) 150 MG capsule    Sig: Take 1 capsule (150 mg total) by  mouth 2 (two) times daily.    Dispense:  60 capsule    Refill:  3    Discontinue Gabapentin  . Misc. Devices (ROLLATOR) MISC    Sig: 1 each by Does not apply route daily.    Dispense:  1 each    Refill:  0    Rollator walker with seat  . memantine (NAMENDA) 10 MG tablet    Sig: Take 1 tablet (10 mg  total) by mouth 2 (two) times daily.    Dispense:  60 tablet    Refill:  3  . lisinopril (PRINIVIL,ZESTRIL) 5 MG tablet    Sig: Take 1 tablet (5 mg total) by mouth daily.    Dispense:  30 tablet    Refill:  5    Discontinue previous dose  . lactulose (CHRONULAC) 10 GM/15ML solution    Sig: Take 15 mLs (10 g total) by mouth 2 (two) times daily as needed for mild constipation.    Dispense:  236 mL    Refill:  1  . furosemide (LASIX) 20 MG tablet    Sig: Take 2 tablets (40 mg total) by mouth 2 (two) times daily.    Dispense:  60 tablet    Refill:  3    Discontinue previous dose  . Fluticasone-Salmeterol (ADVAIR DISKUS) 250-50 MCG/DOSE AEPB    Sig: Inhale 1 puff into the lungs 2 (two) times daily.    Dispense:  1 each    Refill:  3  . DULoxetine (CYMBALTA) 60 MG capsule    Sig: Take 1 capsule (60 mg total) by mouth daily.    Dispense:  30 capsule    Refill:  3    Discontinue Prozac  . carvedilol (COREG) 12.5 MG tablet    Sig: Take 1 tablet (12.5 mg total) by mouth 2 (two) times daily with a meal.    Dispense:  60 tablet    Refill:  3  . clopidogrel (PLAVIX) 75 MG tablet    Sig: Take 1 tablet (75 mg total) by mouth daily.    Dispense:  30 tablet    Refill:  3  . atorvastatin (LIPITOR) 80 MG tablet    Sig: Take 1 tablet (80 mg total) by mouth daily at 6 PM.    Dispense:  30 tablet    Refill:  3  . allopurinol (ZYLOPRIM) 300 MG tablet    Sig: Take 1 tablet (300 mg total) by mouth daily.    Dispense:  30 tablet    Refill:  3    Follow-up: Return in about 3 months (around 11/19/2017) for Follow-up of chronic medical conditions.   Charlott Rakes MD

## 2017-08-20 ENCOUNTER — Encounter: Payer: Self-pay | Admitting: Family Medicine

## 2017-08-20 LAB — CMP14+EGFR
ALBUMIN: 4 g/dL (ref 3.6–4.8)
ALT: 30 IU/L (ref 0–44)
AST: 25 IU/L (ref 0–40)
Albumin/Globulin Ratio: 1.7 (ref 1.2–2.2)
Alkaline Phosphatase: 91 IU/L (ref 39–117)
BUN / CREAT RATIO: 18 (ref 10–24)
BUN: 14 mg/dL (ref 8–27)
Bilirubin Total: 0.3 mg/dL (ref 0.0–1.2)
CO2: 28 mmol/L (ref 20–29)
CREATININE: 0.76 mg/dL (ref 0.76–1.27)
Calcium: 10.3 mg/dL — ABNORMAL HIGH (ref 8.6–10.2)
Chloride: 104 mmol/L (ref 96–106)
GFR, EST AFRICAN AMERICAN: 108 mL/min/{1.73_m2} (ref >59)
GFR, EST NON AFRICAN AMERICAN: 94 mL/min/{1.73_m2} (ref >59)
GLOBULIN, TOTAL: 2.4 g/dL (ref 1.5–4.5)
GLUCOSE: 116 mg/dL — AB (ref 65–99)
POTASSIUM: 4.7 mmol/L (ref 3.5–5.2)
SODIUM: 145 mmol/L — AB (ref 134–144)
TOTAL PROTEIN: 6.4 g/dL (ref 6.0–8.5)

## 2017-08-20 LAB — HEPATITIS C ANTIBODY (REFLEX): HCV Ab: <0.1 s/co ratio (ref 0.0–0.9)

## 2017-08-20 LAB — HCV COMMENT:

## 2017-08-23 ENCOUNTER — Telehealth: Payer: Self-pay | Admitting: *Deleted

## 2017-08-23 NOTE — Telephone Encounter (Signed)
Left message on voicemail to return call. Attempt to inform pt of lab results provided by Dr.Newlin.   Charlott Rakes, MD  Carilyn Goodpasture, RN        Labs are stable, he tested negative for hepatitis C.

## 2017-08-25 ENCOUNTER — Ambulatory Visit (HOSPITAL_COMMUNITY): Admission: RE | Admit: 2017-08-25 | Payer: Medicare Other | Source: Ambulatory Visit

## 2017-08-25 NOTE — Telephone Encounter (Signed)
Left message on voicemail to return call. Attempt to inform patient of lab results provided by Dr. Margarita Rana.

## 2017-08-26 ENCOUNTER — Ambulatory Visit (INDEPENDENT_AMBULATORY_CARE_PROVIDER_SITE_OTHER): Payer: Medicare Other | Admitting: Orthopaedic Surgery

## 2017-08-27 ENCOUNTER — Telehealth: Payer: Self-pay

## 2017-08-27 NOTE — Telephone Encounter (Signed)
Did not leave VM, the vm message said that it was [name]'s phone(his wife) didn't want to leave VM.

## 2017-09-06 DIAGNOSIS — J449 Chronic obstructive pulmonary disease, unspecified: Secondary | ICD-10-CM | POA: Diagnosis not present

## 2017-09-07 ENCOUNTER — Ambulatory Visit (INDEPENDENT_AMBULATORY_CARE_PROVIDER_SITE_OTHER): Payer: Medicare Other | Admitting: Orthopaedic Surgery

## 2017-09-17 ENCOUNTER — Ambulatory Visit (INDEPENDENT_AMBULATORY_CARE_PROVIDER_SITE_OTHER): Payer: Medicare Other | Admitting: Orthopaedic Surgery

## 2017-09-19 ENCOUNTER — Encounter (HOSPITAL_COMMUNITY): Payer: Self-pay

## 2017-09-19 ENCOUNTER — Emergency Department (HOSPITAL_COMMUNITY): Payer: Medicare Other

## 2017-09-19 ENCOUNTER — Emergency Department (HOSPITAL_COMMUNITY)
Admission: EM | Admit: 2017-09-19 | Discharge: 2017-09-19 | Disposition: A | Discharge status: Home / Self Care | Payer: Medicare Other | Attending: Emergency Medicine | Admitting: Emergency Medicine

## 2017-09-19 DIAGNOSIS — R140 Abdominal distension (gaseous): Secondary | ICD-10-CM | POA: Insufficient documentation

## 2017-09-19 DIAGNOSIS — I5032 Chronic diastolic (congestive) heart failure: Secondary | ICD-10-CM | POA: Insufficient documentation

## 2017-09-19 DIAGNOSIS — M25532 Pain in left wrist: Secondary | ICD-10-CM | POA: Diagnosis not present

## 2017-09-19 DIAGNOSIS — R609 Edema, unspecified: Secondary | ICD-10-CM | POA: Diagnosis not present

## 2017-09-19 DIAGNOSIS — F10920 Alcohol use, unspecified with intoxication, uncomplicated: Secondary | ICD-10-CM

## 2017-09-19 DIAGNOSIS — J45909 Unspecified asthma, uncomplicated: Secondary | ICD-10-CM | POA: Insufficient documentation

## 2017-09-19 DIAGNOSIS — R4781 Slurred speech: Secondary | ICD-10-CM | POA: Diagnosis not present

## 2017-09-19 DIAGNOSIS — F0390 Unspecified dementia without behavioral disturbance: Secondary | ICD-10-CM | POA: Diagnosis not present

## 2017-09-19 DIAGNOSIS — Y906 Blood alcohol level of 120-199 mg/100 ml: Secondary | ICD-10-CM | POA: Diagnosis not present

## 2017-09-19 DIAGNOSIS — S59912A Unspecified injury of left forearm, initial encounter: Secondary | ICD-10-CM | POA: Diagnosis not present

## 2017-09-19 DIAGNOSIS — R0781 Pleurodynia: Secondary | ICD-10-CM | POA: Insufficient documentation

## 2017-09-19 DIAGNOSIS — F17210 Nicotine dependence, cigarettes, uncomplicated: Secondary | ICD-10-CM | POA: Diagnosis not present

## 2017-09-19 DIAGNOSIS — S0990XA Unspecified injury of head, initial encounter: Secondary | ICD-10-CM | POA: Insufficient documentation

## 2017-09-19 DIAGNOSIS — W19XXXA Unspecified fall, initial encounter: Secondary | ICD-10-CM

## 2017-09-19 DIAGNOSIS — M79642 Pain in left hand: Secondary | ICD-10-CM | POA: Diagnosis not present

## 2017-09-19 DIAGNOSIS — Y92002 Bathroom of unspecified non-institutional (private) residence single-family (private) house as the place of occurrence of the external cause: Secondary | ICD-10-CM | POA: Insufficient documentation

## 2017-09-19 DIAGNOSIS — M79632 Pain in left forearm: Secondary | ICD-10-CM | POA: Diagnosis not present

## 2017-09-19 DIAGNOSIS — E119 Type 2 diabetes mellitus without complications: Secondary | ICD-10-CM | POA: Diagnosis not present

## 2017-09-19 DIAGNOSIS — M79602 Pain in left arm: Secondary | ICD-10-CM | POA: Diagnosis not present

## 2017-09-19 DIAGNOSIS — Z79899 Other long term (current) drug therapy: Secondary | ICD-10-CM | POA: Insufficient documentation

## 2017-09-19 DIAGNOSIS — W01198A Fall on same level from slipping, tripping and stumbling with subsequent striking against other object, initial encounter: Secondary | ICD-10-CM | POA: Diagnosis not present

## 2017-09-19 DIAGNOSIS — Z7902 Long term (current) use of antithrombotics/antiplatelets: Secondary | ICD-10-CM | POA: Diagnosis not present

## 2017-09-19 DIAGNOSIS — I110 Hypertensive heart disease with heart failure: Secondary | ICD-10-CM | POA: Insufficient documentation

## 2017-09-19 DIAGNOSIS — Z96659 Presence of unspecified artificial knee joint: Secondary | ICD-10-CM | POA: Diagnosis not present

## 2017-09-19 DIAGNOSIS — Y999 Unspecified external cause status: Secondary | ICD-10-CM | POA: Insufficient documentation

## 2017-09-19 DIAGNOSIS — R102 Pelvic and perineal pain: Secondary | ICD-10-CM | POA: Diagnosis not present

## 2017-09-19 DIAGNOSIS — S199XXA Unspecified injury of neck, initial encounter: Secondary | ICD-10-CM | POA: Diagnosis not present

## 2017-09-19 DIAGNOSIS — R600 Localized edema: Secondary | ICD-10-CM | POA: Diagnosis not present

## 2017-09-19 DIAGNOSIS — Y9301 Activity, walking, marching and hiking: Secondary | ICD-10-CM | POA: Insufficient documentation

## 2017-09-19 DIAGNOSIS — I2510 Atherosclerotic heart disease of native coronary artery without angina pectoris: Secondary | ICD-10-CM | POA: Insufficient documentation

## 2017-09-19 DIAGNOSIS — S299XXA Unspecified injury of thorax, initial encounter: Secondary | ICD-10-CM | POA: Diagnosis not present

## 2017-09-19 DIAGNOSIS — I959 Hypotension, unspecified: Secondary | ICD-10-CM | POA: Diagnosis not present

## 2017-09-19 LAB — CBC WITH DIFFERENTIAL/PLATELET
ABS IMMATURE GRANULOCYTES: 0.1 10*3/uL (ref 0.0–0.1)
BASOS ABS: 0.1 10*3/uL (ref 0.0–0.1)
BASOS PCT: 1 %
Eosinophils Absolute: 0.3 10*3/uL (ref 0.0–0.7)
Eosinophils Relative: 4 %
HCT: 40.9 % (ref 39.0–52.0)
HEMOGLOBIN: 13.1 g/dL (ref 13.0–17.0)
Immature Granulocytes: 1 %
Lymphocytes Relative: 24 %
Lymphs Abs: 2 10*3/uL (ref 0.7–4.0)
MCH: 29.7 pg (ref 26.0–34.0)
MCHC: 32 g/dL (ref 30.0–36.0)
MCV: 92.7 fL (ref 78.0–100.0)
MONO ABS: 0.9 10*3/uL (ref 0.1–1.0)
MONOS PCT: 11 %
NEUTROS ABS: 4.8 10*3/uL (ref 1.7–7.7)
NEUTROS PCT: 59 %
PLATELETS: 141 10*3/uL — AB (ref 150–400)
RBC: 4.41 MIL/uL (ref 4.22–5.81)
RDW: 15.4 % (ref 11.5–15.5)
WBC: 8.1 10*3/uL (ref 4.0–10.5)

## 2017-09-19 LAB — URINALYSIS, ROUTINE W REFLEX MICROSCOPIC
BILIRUBIN URINE: NEGATIVE
Bacteria, UA: NONE SEEN
Glucose, UA: NEGATIVE mg/dL
KETONES UR: NEGATIVE mg/dL
LEUKOCYTES UA: NEGATIVE
NITRITE: NEGATIVE
Protein, ur: NEGATIVE mg/dL
SPECIFIC GRAVITY, URINE: 1.003 — AB (ref 1.005–1.030)
pH: 5 (ref 5.0–8.0)

## 2017-09-19 LAB — RAPID URINE DRUG SCREEN, HOSP PERFORMED
Amphetamines: NOT DETECTED
Barbiturates: NOT DETECTED
Benzodiazepines: NOT DETECTED
COCAINE: NOT DETECTED
OPIATES: NOT DETECTED
TETRAHYDROCANNABINOL: NOT DETECTED

## 2017-09-19 LAB — COMPREHENSIVE METABOLIC PANEL
ALT: 17 U/L (ref 0–44)
ANION GAP: 12 (ref 5–15)
AST: 24 U/L (ref 15–41)
Albumin: 3.3 g/dL — ABNORMAL LOW (ref 3.5–5.0)
Alkaline Phosphatase: 60 U/L (ref 38–126)
BILIRUBIN TOTAL: 1.1 mg/dL (ref 0.3–1.2)
BUN: 9 mg/dL (ref 8–23)
CHLORIDE: 102 mmol/L (ref 98–111)
CO2: 20 mmol/L — AB (ref 22–32)
Calcium: 9.2 mg/dL (ref 8.9–10.3)
Creatinine, Ser: 0.89 mg/dL (ref 0.61–1.24)
GFR calc Af Amer: >60 mL/min (ref >60)
GFR calc non Af Amer: >60 mL/min (ref >60)
GLUCOSE: 99 mg/dL (ref 70–99)
POTASSIUM: 4.7 mmol/L (ref 3.5–5.1)
SODIUM: 134 mmol/L — AB (ref 135–145)
TOTAL PROTEIN: 5.8 g/dL — AB (ref 6.5–8.1)

## 2017-09-19 LAB — AMMONIA: AMMONIA: 23 umol/L (ref 9–35)

## 2017-09-19 LAB — I-STAT TROPONIN, ED: Troponin i, poc: 0.01 ng/mL (ref 0.00–0.08)

## 2017-09-19 LAB — BRAIN NATRIURETIC PEPTIDE: B NATRIURETIC PEPTIDE 5: 90.2 pg/mL (ref 0.0–100.0)

## 2017-09-19 LAB — PROTIME-INR
INR: 1
Prothrombin Time: 13.1 seconds (ref 11.4–15.2)

## 2017-09-19 LAB — ETHANOL: ALCOHOL ETHYL (B): 148 mg/dL — AB (ref <10)

## 2017-09-19 MED ORDER — FUROSEMIDE 10 MG/ML IJ SOLN
40.0000 mg | Freq: Once | INTRAMUSCULAR | Status: DC
Start: 2017-09-19 — End: 2017-09-20
  Filled 2017-09-19: qty 4

## 2017-09-19 NOTE — ED Notes (Signed)
Patient requested a ride home via ambulance. Stated that the last time he was here he went home via ambulance. Patient stated unable to walk and wheelchair bound. After requesting secretary to call [facility], went back in room to find that patient had crawled out of the end of stretcher (both side rails up), standing at the cabinet, fully dressed. [facility] transport cancelled. Pt then stated [employer] cab would pick him up and that he had to go now - no time to sign d/c paperwork. Verbalized understanding.

## 2017-09-19 NOTE — ED Notes (Signed)
[facility] canceled  ?

## 2017-09-19 NOTE — Discharge Instructions (Signed)
Avoid drinking alcohol.   Take lasix 40 mg twice daily as prescribed by your doctor.   Take tylenol for pain.   See your doctor next week   Return to ER if you have worse swelling, falls, chest pain.

## 2017-09-19 NOTE — ED Notes (Signed)
[facility] called for transport home. 

## 2017-09-19 NOTE — ED Provider Notes (Signed)
Care assumed from Dr. Darl Householder at shift change, please see his note for full details, but in brief  [name] is a 67 y.o. male with a history of alcohol use CAD, COPD, non-compliant w/ meds, and chronic left sided weakness, resents after a mechanical fall where he hit his left ribs.  X-rays and head CT ordered by previous provider are all unremarkable.  The hall level elevated at 148 which is to be expected, no other acute lab abnormalities noted, BNP pending as patient was noted to have 2+ edema in bilateral upper and lower extremities.  Prior provider attempted to give dose of IV Lasix but patient refused.  Patient was able to ambulate steadily in the ED with a walker.  Plan: follow up pending labs If BNP > 1000, IV lasix and admit, if lower D/C to home to continue hoem medications with outpt follow up  [place] - Abnormal; Notable for the following components:      Result Value   Sodium 134 (*)    CO2 20 (*)    Total Protein 5.8 (*)    Albumin 3.3 (*)    All other components within normal limits  ETHANOL - Abnormal; Notable for the following components:   Alcohol, Ethyl (B) 148 (*)    All other components within normal limits  URINALYSIS, ROUTINE W REFLEX MICROSCOPIC - Abnormal; Notable for the following components:   Color, Urine STRAW (*)    Specific Gravity, Urine 1.003 (*)    Hgb urine dipstick SMALL (*)    All other components within normal limits  CBC WITH DIFFERENTIAL/PLATELET - Abnormal; Notable for the following components:   Platelets 141 (*)    All other components within normal limits  AMMONIA  RAPID URINE DRUG SCREEN, HOSP PERFORMED  BRAIN NATRIURETIC PEPTIDE  PROTIME-INR  CBC WITH DIFFERENTIAL/PLATELET  I-STAT TROPONIN, ED    No significant elevation in patient's BNP, and he would like to go home, reports he has plenty of his lasix at home.  Based on plan discussed and agreed upon with previous provider patient is stable for  discharge home at this time.  He will resume taking his Lasix and other regular medications and contact his primary care doctor tomorrow to set up follow-up appointment.  Return precautions discussed with the patient and he expresses understanding and is in agreement with plan.   Final diagnoses:  Peripheral edema  Fall, initial encounter  Alcoholic intoxication without complication [facility])      Jacqlyn Larsen, PA-C 09/19/17 2321    Drenda Freeze, MD 09/21/17 [phone]

## 2017-09-19 NOTE — ED Provider Notes (Signed)
[facility] EMERGENCY DEPARTMENT Provider Note   CSN: [id number] Arrival date & time: 09/19/17  1832     History   Chief Complaint Chief Complaint  Patient presents with  . Leg Swelling  . Fall  . Rib Injury    HPI SRIHAN BRUTUS is a 67 y.o. male hx of alcohol abuse, CAD, COPD, previous MI here with fall, left rib pain, left arm swelling, bilateral leg swelling.  Patient states that he drinks beers chronically and did drink some beer earlier today.  He states that he was walking the bathroom accidentally stumbled and fell and hit his head and his ribs against the toilet.  He states that he is having left rib pain as well as left arm and hand swelling.  He states that he has chronic weakness numbness in the left arm but he has more pain in the wrist area since the injury.  Patient states that he is not taking any blood thinners currently.  Patient noticed abdominal distention of his last several months as well as progressive leg swelling over the last several months.  He has a history of CHF but unable to tell me what he is taking for it.  Patient was noted by EMS to be tremulous and altered.  The history is provided by the patient and the EMS personnel.  Level V caveat- alcohol intoxication   Past Medical History:  Diagnosis Date  . Alcohol abuse    H/O  . Anxiety   . Arthritis   . Asthma   . CAD (coronary artery disease) 05/27/10   Cath: severe single vessell CAD left cx midportion obtuse marginal 2 to 3.  [name] CHF (congestive heart failure) ([place])   . COPD (chronic obstructive pulmonary disease) ([place])   . COPD (chronic obstructive pulmonary disease) ([place])   . Depression   . Diabetes mellitus, type 2 ([place]) 03/13/2015  . GERD (gastroesophageal reflux disease)   . HCAP (healthcare-associated pneumonia) 10/15/2015  . History of DVT of lower extremity   . Hyperlipemia   . Hyperlipidemia   . Hypertension   . Myocardial infarction ([place]) 2000  . Orbital fracture  ([place]) 12/2012  . OSA on CPAP   . Peripheral vascular disease, unspecified ([place])    08/20/10 doppler: increase in right ABI post-op. Left ABI stable. S/P bi-fem bypass surgery  . Pneumonia 04/04/2012  . Shortness of breath   . Stroke ([place])   . Tobacco abuse     Patient Active Problem List   Diagnosis Date Noted  . Cerebral thrombosis with cerebral infarction 07/18/2016  . Acute ischemic stroke ([place])   . Left hemiplegia ([place])   . Stroke-like symptoms 07/17/2016  . Insomnia 01/29/2016  . Depression 01/29/2016  . Alcohol abuse with intoxication ([place]) 10/16/2015  . Psoriasiform dermatitis 07/12/2015  . Dementia due to another medical condition 07/12/2015  . Urinary incontinence 03/14/2015  . Diabetes mellitus, type 2 ([place]) 03/13/2015  . Hemiparesis affecting left side as late effect of cerebrovascular accident ([place]) 02/18/2015  . Cerebrovascular accident (CVA) due to stenosis of right carotid artery ([place])   . Coronary artery disease involving native coronary artery of native heart without angina pectoris   . Tachypnea   . Thrombocytopenia ([place])   . History of stroke   . Stroke (cerebrum) ([place]) 02/06/2015  . Arterial ischemic stroke, MCA (middle cerebral artery), right, acute ([place])   . DOE (dyspnea on exertion) 11/23/2014  . PAF (paroxysmal atrial fibrillation) ([place]) 11/23/2014  . HLD (  hyperlipidemia) 11/01/2014  . Tobacco use disorder 11/01/2014  . Diastolic CHF, acute on chronic (HCC) 11/01/2014  . Cerebral infarction due to stenosis of right carotid artery ([place]) 11/01/2014  . Carotid stenosis 11/01/2014  . Hyperkalemia 10/24/2014  . H/O total knee replacement 10/24/2014  . Essential hypertension   . Polysubstance abuse ([place]) 08/28/2014  . Cocaine abuse ([place])   . Intracranial carotid stenosis   . Headache around the eyes 08/27/2014  . Acute CVA (cerebrovascular accident) ([place]) 08/27/2014  . CVA (cerebral vascular accident) ([place]) 08/26/2014  . History of DVT (deep vein  thrombosis)   . Alcoholism ([place])   . Embolic stroke ([place])   . Cerebral infarction due to unspecified mechanism   . Hypotension   . AKI (acute kidney injury) ([place])   . Stroke ([place])   . CVA (cerebral infarction) 08/01/2014  . Alcohol dependence with withdrawal with complication ([place]) 82/42/3536  . DVT (deep venous thrombosis) ([place]) 02/22/2014  . Lower extremity edema 02/21/2014  . Chronic diastolic congestive heart failure ([place]) 11/21/2013  . CAD (coronary artery disease) 11/21/2013  . Alcohol abuse 11/21/2013  . GERD (gastroesophageal reflux disease) 08/10/2013  . Gout 08/10/2013  . Tobacco abuse 07/26/2013  . Trichiasis without entropion 04/16/2013  . Dizziness 04/10/2013  . Eyelid lesion 01/24/2013  . Enophthalmos due to trauma 01/19/2013  . Medial orbital wall fracture (HCC) 01/19/2013  . Closed blow-out fracture of floor of orbit ([place]) 01/19/2013  . Binocular vision disorder with diplopia 01/03/2013  . Fracture of inferior orbital wall ([place]) 01/03/2013  . Fracture of orbital floor ([place]) 01/03/2013  . Pain of left eye 01/03/2013  . Peripheral vascular disease ([place]) 07/27/2012  . COPD (chronic obstructive pulmonary disease) ([place]) 04/04/2012  . HTN (hypertension) 04/04/2012    Past Surgical History:  Procedure Laterality Date  . abdoninal ao angio & bifem angio  05/27/10   Patent graft, occluded bil stents with no retrograde flow into the hypogastric arteries. 100% occl left ant. tibial artery, 70% to 80% to 100% stenosis right superficial fem artery above adductor canal. 100 % occl right ant. tibial vessell  . Aortogram w/ PTCA  09/292003  . BACK SURGERY    . CARDIAC CATHETERIZATION  05/27/10   severe CAD left cx  . CHOLECYSTECTOMY    . ESOPHAGOGASTRIC FUNDOPLICATION    . EYE SURGERY    . FEMORAL BYPASS  08/19/10   Right Fem-Pop  . IR ANGIO INTRA EXTRACRAN SEL COM CAROTID INNOMINATE BILAT MOD SED  07/20/2016  . IR ANGIO VERTEBRAL SEL SUBCLAVIAN INNOMINATE UNI R MOD SED   07/20/2016  . IR ANGIO VERTEBRAL SEL VERTEBRAL UNI L MOD SED  07/20/2016  . IR RADIOLOGIST EVAL & MGMT  05/27/2016  . JOINT REPLACEMENT    . PERIPHERAL VASCULAR CATHETERIZATION N/A 12/10/2014   Procedure: Abdominal Aortogram;  Surgeon: Angelia Mould, MD;  Location: [facility] CV LAB;  Service: Cardiovascular;  Laterality: N/A;  . RADIOLOGY WITH ANESTHESIA N/A 02/08/2015   Procedure: RADIOLOGY WITH ANESTHESIA;  Surgeon: Luanne Bras, MD;  Location: [facility];  Service: Radiology;  Laterality: N/A;  . TEE WITHOUT CARDIOVERSION N/A 08/29/2014   Procedure: TRANSESOPHAGEAL ECHOCARDIOGRAM (TEE);  Surgeon: Josue Hector, MD;  Location: [facility] ENDOSCOPY;  Service: Cardiovascular;  Laterality: N/A;  . TOTAL KNEE ARTHROPLASTY          Home Medications    Prior to Admission medications   Medication Sig Start Date End Date Taking? Authorizing Provider  ACCU-CHEK SOFTCLIX LANCETS lancets Use 3 times  daily before meals 09/17/15   Charlott Rakes, MD  albuterol (PROAIR HFA) 108 (90 Base) MCG/ACT inhaler Inhale 2 puffs into the lungs every 6 (six) hours as needed for wheezing or shortness of breath. 08/19/17   Charlott Rakes, MD  albuterol (PROVENTIL) (2.5 MG/3ML) 0.083% nebulizer solution Take 3 mLs (2.5 mg total) by nebulization every 6 (six) hours as needed for wheezing or shortness of breath. 75/12=7 05/20/17   Charlott Rakes, MD  allopurinol (ZYLOPRIM) 300 MG tablet Take 1 tablet (300 mg total) by mouth daily. 08/19/17   Charlott Rakes, MD  atorvastatin (LIPITOR) 80 MG tablet Take 1 tablet (80 mg total) by mouth daily at 6 PM. 08/19/17   Charlott Rakes, MD  Blood Glucose Monitoring Suppl (ACCU-CHEK AVIVA) device Use 3 times daily before meals. 03/13/15   Charlott Rakes, MD  carvedilol (COREG) 12.5 MG tablet Take 1 tablet (12.5 mg total) by mouth 2 (two) times daily with a meal. 08/19/17   Charlott Rakes, MD  clopidogrel (PLAVIX) 75 MG tablet Take 1 tablet (75 mg total) by mouth daily. 08/19/17    Charlott Rakes, MD  DULoxetine (CYMBALTA) 60 MG capsule Take 1 capsule (60 mg total) by mouth daily. 08/19/17   Charlott Rakes, MD  Fluticasone-Salmeterol (ADVAIR DISKUS) 250-50 MCG/DOSE AEPB Inhale 1 puff into the lungs 2 (two) times daily. 08/19/17   Charlott Rakes, MD  furosemide (LASIX) 20 MG tablet Take 2 tablets (40 mg total) by mouth 2 (two) times daily. 08/19/17   Charlott Rakes, MD  glucose blood (ACCU-CHEK AVIVA) test strip Use 3 times daily before meals as directed. 01/29/16   Charlott Rakes, MD  lactulose (CHRONULAC) 10 GM/15ML solution Take 15 mLs (10 g total) by mouth 2 (two) times daily as needed for mild constipation. 08/19/17   Charlott Rakes, MD  Lancet Devices [facility]) lancets Use 3 times daily before meals 09/27/15   Charlott Rakes, MD  lisinopril (PRINIVIL,ZESTRIL) 5 MG tablet Take 1 tablet (5 mg total) by mouth daily. 08/19/17   Charlott Rakes, MD  memantine (NAMENDA) 10 MG tablet Take 1 tablet (10 mg total) by mouth 2 (two) times daily. 08/19/17   Charlott Rakes, MD  Misc. Devices (ROLLATOR) MISC 1 each by Does not apply route daily. 08/19/17   Charlott Rakes, MD  pantoprazole (PROTONIX) 40 MG tablet TAKE 1 TABLET BY MOUTH EVERY MORNING 08/17/17   Charlott Rakes, MD  polyethylene glycol (MIRALAX / GLYCOLAX) packet Take 17 g by mouth daily. 07/22/16   Diallo, Earna Coder, MD  pregabalin (LYRICA) 150 MG capsule Take 1 capsule (150 mg total) by mouth 2 (two) times daily. 08/19/17   Charlott Rakes, MD  rOPINIRole (REQUIP) 0.5 MG tablet Take 1 tablet (0.5 mg total) by mouth at bedtime. 08/19/17   Charlott Rakes, MD  Selenium Sulfide 2.25 % SHAM Apply 1 application topically 2 (two) times a week. 01/28/17   Charlott Rakes, MD  tiZANidine (ZANAFLEX) 4 MG tablet Take 1 tablet (4 mg total) by mouth every 8 (eight) hours as needed for muscle spasms. 08/19/17   Charlott Rakes, MD  traZODone (DESYREL) 150 MG tablet Take 1 tablet (150 mg total) by mouth at bedtime as needed for  sleep. 08/19/17   Charlott Rakes, MD  triamcinolone cream (KENALOG) 0.1 % Apply 1 application topically daily as needed (for dermatitis). 06/02/16   Charlott Rakes, MD    Family History Family History  Problem Relation Age of Onset  . Heart attack Mother   . Heart failure Mother   .  Cirrhosis Father   . Heart failure Brother   . Cancer Brother   . Hypertension Neg Hx        UNKNOWN  . Stroke Neg Hx        UNKNOWN    Social History Social History   Tobacco Use  . Smoking status: Current Every Day Smoker    Packs/day: 0.50    Years: 50.00    Pack years: 25.00    Types: Cigars, Cigarettes    Start date: 02/09/1961  . Smokeless tobacco: Former Systems developer    Types: Chew  . Tobacco comment: 2 ppd, full flavor  Substance Use Topics  . Alcohol use: Yes  . Drug use: No     Allergies   Darvocet [propoxyphene n-acetaminophen]; Haldol [haloperidol decanoate]; Acetaminophen; Metformin and related; and Norco [hydrocodone-acetaminophen]   Review of Systems Review of Systems  Musculoskeletal:       L rib pain, L arm and hand pain and swelling   All other systems reviewed and are negative.    Physical Exam Updated Vital Signs BP (!) 103/56 (BP Location: Right Arm)   Pulse 87   Temp 98 F (36.7 C) (Oral)   Resp (!) 30   SpO2 96%   Physical Exam  Constitutional:  Uncomfortable, chronically ill, intoxicated   HENT:  Head: Normocephalic.  No obvious scalp hematoma   Eyes: Pupils are equal, round, and reactive to light. EOM are normal.  Neck: Normal range of motion. Neck supple.  Cardiovascular: Normal rate, regular rhythm and normal heart sounds.  Pulmonary/Chest: Effort normal and breath sounds normal. No stridor. No respiratory distress.  Mild L lower rib tenderness   Abdominal:  + fluid wave, nontender.   Musculoskeletal:  2+ edema bilateral legs. L forearm 1+ edema and mild wrist tenderness but no obvious deformity. L hand swollen but no obvious deformity, 2+ radial  pulse.   Neurological: He is alert.  Slightly confused, no obvious asterixis. Strength 4/5 L arm and leg (chronic), strength 5/5 R arm and leg.   Skin: Skin is warm.  Psychiatric: He has a normal mood and affect.  Nursing note and vitals reviewed.    ED Treatments / Results  Labs (all labs ordered are listed, but only abnormal results are displayed) Labs Reviewed  COMPREHENSIVE METABOLIC PANEL - Abnormal; Notable for the following components:      Result Value   Sodium 134 (*)    CO2 20 (*)    Total Protein 5.8 (*)    Albumin 3.3 (*)    All other components within normal limits  ETHANOL - Abnormal; Notable for the following components:   Alcohol, Ethyl (B) 148 (*)    All other components within normal limits  CBC WITH DIFFERENTIAL/PLATELET - Abnormal; Notable for the following components:   Platelets 141 (*)    All other components within normal limits  AMMONIA  PROTIME-INR  CBC WITH DIFFERENTIAL/PLATELET  URINALYSIS, ROUTINE W REFLEX MICROSCOPIC  RAPID URINE DRUG SCREEN, HOSP PERFORMED  BRAIN NATRIURETIC PEPTIDE  I-STAT TROPONIN, ED    EKG EKG Interpretation  Date/Time:  Sunday September 19 2017 18:38:31 EDT Ventricular Rate:  90 PR Interval:    QRS Duration: 90 QT Interval:  377 QTC Calculation: 462 R Axis:   49 Text Interpretation:  Sinus rhythm Anterior infarct, old No significant change since last tracing Confirmed by Wandra Arthurs ([id number]) on 09/19/2017 6:41:48 PM   Radiology Dg Ribs Unilateral W/chest Left  Result Date: 09/19/2017 CLINICAL DATA:  Status  post fall with left rib pain. EXAM: LEFT RIBS AND CHEST - 3+ VIEW COMPARISON:  Chest x-ray April 13, 2017, March 17, 2017 FINDINGS: No fracture or dislocation are seen involving the ribs. There is chronic deformity of the lateral left sixth rib unchanged compared to prior chest x-ray. There is no evidence of pneumothorax or pleural effusion. There is no focal infiltrate, pulmonary edema, pleural effusion. Right  lateral mid lung pleural thickening is unchanged. Mediastinal contour is normal. The heart size is enlarged. IMPRESSION: No acute fracture or dislocation of left ribs. No acute cardiopulmonary disease identified. Electronically Signed   By: Abelardo Diesel M.D.   On: 09/19/2017 20:22   Dg Pelvis 1-2 Views  Result Date: 09/19/2017 CLINICAL DATA:  Status post fall with pelvic pain. EXAM: PELVIS - 1-2 VIEW COMPARISON:  None. FINDINGS: There is no evidence of pelvic fracture or dislocation. Vascular stent is identified. Surgical clips are noted over bilateral inguinal region. IMPRESSION: No acute fracture or dislocation. Electronically Signed   By: Abelardo Diesel M.D.   On: 09/19/2017 20:24   Dg Forearm Left  Result Date: 09/19/2017 CLINICAL DATA:  Status post fall with left forearm pain. EXAM: LEFT FOREARM - 2 VIEW COMPARISON:  None. FINDINGS: There is no evidence of fracture or dislocation. Soft tissues are unremarkable. IMPRESSION: No acute fracture or dislocation. Electronically Signed   By: Abelardo Diesel M.D.   On: 09/19/2017 20:23   Ct Head Wo Contrast  Result Date: 09/19/2017 CLINICAL DATA:  Head injury. Fell in bathroom. EXAM: CT HEAD WITHOUT CONTRAST CT CERVICAL SPINE WITHOUT CONTRAST TECHNIQUE: Multidetector CT imaging of the head and cervical spine was performed following the standard protocol without intravenous contrast. Multiplanar CT image reconstructions of the cervical spine were also generated. COMPARISON:  04/13/2017 FINDINGS: CT HEAD FINDINGS Brain: No evidence of acute infarction, hemorrhage, hydrocephalus, extra-axial collection or mass lesion/mass effect. Encephalomalacia within the right parietal lobe compatible with old infarct. There is mild diffuse low-attenuation within the subcortical and periventricular white matter compatible with chronic microvascular disease. There is prominence of the sulci and ventricles compatible with brain atrophy. Vascular: No hyperdense vessel or  unexpected calcification. Skull: Normal. Negative for fracture or focal lesion. Sinuses/Orbits: Previous surgical repair of left orbital fracture. Moderate mucosal thickening of the left maxillary sinus noted. No acute findings identified. Other: None CT CERVICAL SPINE FINDINGS Alignment: Normal. Skull base and vertebrae: No acute fracture. No primary bone lesion or focal pathologic process. Soft tissues and spinal canal: No prevertebral fluid or swelling. No visible canal hematoma. Disc levels: Solid fusion of the C2 and C3 vertebra identified. Multi level disc space narrowing and ventral endplate spurring noted. Most advanced at C6-7. Upper chest: Negative. Other: A expandable stent is identified within the right internal carotid artery. IMPRESSION: 1. No acute intracranial abnormalities. 2. Chronic small vessel ischemic disease and brain atrophy. 3. No evidence for cervical spine fracture. 4. Cervical degenerative disc disease. Electronically Signed   By: Kerby Moors M.D.   On: 09/19/2017 19:57   Ct Cervical Spine Wo Contrast  Result Date: 09/19/2017 CLINICAL DATA:  Head injury. Fell in bathroom. EXAM: CT HEAD WITHOUT CONTRAST CT CERVICAL SPINE WITHOUT CONTRAST TECHNIQUE: Multidetector CT imaging of the head and cervical spine was performed following the standard protocol without intravenous contrast. Multiplanar CT image reconstructions of the cervical spine were also generated. COMPARISON:  04/13/2017 FINDINGS: CT HEAD FINDINGS Brain: No evidence of acute infarction, hemorrhage, hydrocephalus, extra-axial collection or mass lesion/mass effect. Encephalomalacia within the  right parietal lobe compatible with old infarct. There is mild diffuse low-attenuation within the subcortical and periventricular white matter compatible with chronic microvascular disease. There is prominence of the sulci and ventricles compatible with brain atrophy. Vascular: No hyperdense vessel or unexpected calcification. Skull:  Normal. Negative for fracture or focal lesion. Sinuses/Orbits: Previous surgical repair of left orbital fracture. Moderate mucosal thickening of the left maxillary sinus noted. No acute findings identified. Other: None CT CERVICAL SPINE FINDINGS Alignment: Normal. Skull base and vertebrae: No acute fracture. No primary bone lesion or focal pathologic process. Soft tissues and spinal canal: No prevertebral fluid or swelling. No visible canal hematoma. Disc levels: Solid fusion of the C2 and C3 vertebra identified. Multi level disc space narrowing and ventral endplate spurring noted. Most advanced at C6-7. Upper chest: Negative. Other: A expandable stent is identified within the right internal carotid artery. IMPRESSION: 1. No acute intracranial abnormalities. 2. Chronic small vessel ischemic disease and brain atrophy. 3. No evidence for cervical spine fracture. 4. Cervical degenerative disc disease. Electronically Signed   By: Kerby Moors M.D.   On: 09/19/2017 19:57   Dg Hand Complete Left  Result Date: 09/19/2017 CLINICAL DATA:  Status post fall with left hand pain. EXAM: LEFT HAND - COMPLETE 3+ VIEW COMPARISON:  None. FINDINGS: There is no evidence of fracture or dislocation. There is chronic deformity of the left first distal phalanx with associated multiple small soft tissue radiopaque foreign body. Degenerative joint changes of the first metacarpal-carpal joint is noted. IMPRESSION: No acute fracture or dislocation. Chronic deformity of the left first distal phalanx with associated multiple small soft tissue radiopaque foreign body. Electronically Signed   By: Abelardo Diesel M.D.   On: 09/19/2017 20:26    Procedures Procedures (including critical care time)  Angiocath insertion Performed by: Wandra Arthurs  Consent: Verbal consent obtained. Risks and benefits: risks, benefits and alternatives were discussed Time out: Immediately prior to procedure a "time out" was called to verify the correct  patient, procedure, equipment, support staff and site/side marked as required.  Preparation: Patient was prepped and draped in the usual sterile fashion.  Vein Location: R antecube  Ultrasound Guided  Gauge: 20 long   Normal blood return and flush without difficulty Patient tolerance: Patient tolerated the procedure well with no immediate complications.     Medications Ordered in ED Medications  furosemide (LASIX) injection 40 mg (has no administration in time range)     Initial Impression / Assessment and Plan / ED Course  I have reviewed the triage vital signs and the nursing notes.  Pertinent labs & imaging results that were available during my care of the patient were reviewed by me and considered in my medical decision making (see chart for details).    CRUZE ZINGARO is a 67 y.o. male here with fall, alcohol intoxication. Patient is a chronic alcoholic and had mechanical fall. Also appears having anasarca which is likely from alcohol use vs CHF exacerbation. He is supposed to be on lasix 40 mg BID but hasn't been compliant with it. Will get labs, BNP, CT head/neck, xrays.   10:05 PM  CT head/ neck unremarkable. ETOH 148. Xrays unremarkable. Able to ambulate in the ED. ETOH 148. BNP pending. I ordered lasix 40 mg IV but patient refused. If BNP very elevated, then will give IV lasix and admit. Otherwise, he can be discharged home. Encouraged him to take his home lasix as prescribed. Signed out to overnight PA.  Final Clinical Impressions(s) / ED Diagnoses   Final diagnoses:  Peripheral edema  Fall, initial encounter  Alcoholic intoxication without complication [facility])    ED Discharge Orders    None       Drenda Freeze, MD 09/19/17 2205

## 2017-09-19 NOTE — ED Notes (Signed)
Pt refused Lasix at this time, he states "I don't want to be up all night".  Dr. Darl Householder made aware.  Hold for now for BNP results.

## 2017-09-19 NOTE — ED Triage Notes (Signed)
To room via EMS.  Pt fell in bathroom against toilet.  Pain to left ribs. C/o new left arm/hand swelling and bilateral Leg/feet swelling.   EMS BP 131/60 HR 94 SpO2 95%  RR 20  CBG 84

## 2017-09-28 ENCOUNTER — Ambulatory Visit (INDEPENDENT_AMBULATORY_CARE_PROVIDER_SITE_OTHER): Payer: Self-pay

## 2017-09-28 ENCOUNTER — Encounter (INDEPENDENT_AMBULATORY_CARE_PROVIDER_SITE_OTHER): Payer: Self-pay | Admitting: Orthopaedic Surgery

## 2017-09-28 ENCOUNTER — Ambulatory Visit (INDEPENDENT_AMBULATORY_CARE_PROVIDER_SITE_OTHER): Payer: Medicare Other | Admitting: Orthopaedic Surgery

## 2017-09-28 DIAGNOSIS — M25512 Pain in left shoulder: Secondary | ICD-10-CM | POA: Insufficient documentation

## 2017-09-28 HISTORY — DX: Pain in left shoulder: M25.512

## 2017-09-28 NOTE — Progress Notes (Signed)
Office Visit Note   Patient: [name]           Date of Birth: 1950/12/26           MRN: [id number] Visit Date: 09/28/2017              Requested by: Charlott Rakes, MD [place], [place] [address] PCP: Charlott Rakes, MD   Assessment & Plan: Visit Diagnoses:  1. Left shoulder pain, unspecified chronicity     Plan: Impression is left shoulder pseudosubluxation and chronic pain following CVA.  At this point, the patient needs to continue with physical therapy.  A new prescription was given today.  He has also asked for a sling which we have provided, but have instructed him to use this sparingly to inhibit getting adhesive capsulitis.  He will follow-up with [place] as needed.  Follow-Up Instructions: Return if symptoms worsen or fail to improve.   Orders:  Orders Placed This Encounter  Procedures  . XR Shoulder Left   No orders of the defined types were placed in this encounter.     Procedures: No procedures performed   Clinical Data: No additional findings.   Subjective: Chief Complaint  Patient presents with  . Left Shoulder - Pain    HPI patient is a pleasant 67 year old gentleman who presents to our clinic today with chronic left shoulder pain.  This is been ongoing for the past year following a CVA which affected the left side of his body.  The pain and dysfunction to the shoulder has progressively worsened.  Pain occurs at rest and with any slight motion of the shoulder.  He has significant weakness and loss of motion.  He resides at [facility] and is only given Tylenol for the pain.  No numbness, tingling or burning.  He does note that he has been getting home health physical therapy working on the left shoulder twice a week for the past year.  He also notes a previous cortisone injection to the left shoulder which significantly helped but only lasted for a week.  Review of Systems as detailed in HPI.  All others reviewed and are  negative.   Objective: Vital Signs: There were no vitals taken for this visit.  Physical Exam well-developed well-nourished gentleman in no acute distress.  Alert and oriented x3.  Ortho Exam examination of the left shoulder reveals 5 degrees active range of motion.  He can passively get to full range of motion.  Diffuse tenderness throughout the left shoulder joint.  Specialty Comments:  No specialty comments available.  Imaging: Xr Shoulder Left  Result Date: 09/28/2017 Slight inferior subluxation of left humeral head.    PMFS History: Patient Active Problem List   Diagnosis Date Noted  . Left shoulder pain 09/28/2017  . Cerebral thrombosis with cerebral infarction 07/18/2016  . Acute ischemic stroke ([place])   . Left hemiplegia ([place])   . Stroke-like symptoms 07/17/2016  . Insomnia 01/29/2016  . Depression 01/29/2016  . Alcohol abuse with intoxication ([place]) 10/16/2015  . Psoriasiform dermatitis 07/12/2015  . Dementia due to another medical condition 07/12/2015  . Urinary incontinence 03/14/2015  . Diabetes mellitus, type 2 ([place]) 03/13/2015  . Hemiparesis affecting left side as late effect of cerebrovascular accident ([place]) 02/18/2015  . Cerebrovascular accident (CVA) due to stenosis of right carotid artery ([place])   . Coronary artery disease involving native coronary artery of native heart without angina pectoris   . Tachypnea   . Thrombocytopenia ([place])   .  History of stroke   . Stroke (cerebrum) ([place]) 02/06/2015  . Arterial ischemic stroke, MCA (middle cerebral artery), right, acute ([place])   . DOE (dyspnea on exertion) 11/23/2014  . PAF (paroxysmal atrial fibrillation) ([place]) 11/23/2014  . HLD (hyperlipidemia) 11/01/2014  . Tobacco use disorder 11/01/2014  . Diastolic CHF, acute on chronic (HCC) 11/01/2014  . Cerebral infarction due to stenosis of right carotid artery ([place]) 11/01/2014  . Carotid stenosis 11/01/2014  . Hyperkalemia 10/24/2014  . H/O total knee replacement  10/24/2014  . Essential hypertension   . Polysubstance abuse ([place]) 08/28/2014  . Cocaine abuse ([place])   . Intracranial carotid stenosis   . Headache around the eyes 08/27/2014  . Acute CVA (cerebrovascular accident) ([place]) 08/27/2014  . CVA (cerebral vascular accident) ([place]) 08/26/2014  . History of DVT (deep vein thrombosis)   . Alcoholism ([place])   . Embolic stroke ([place])   . Cerebral infarction due to unspecified mechanism   . Hypotension   . AKI (acute kidney injury) ([place])   . Stroke ([place])   . CVA (cerebral infarction) 08/01/2014  . Alcohol dependence with withdrawal with complication ([place]) 70/26/3785  . DVT (deep venous thrombosis) ([place]) 02/22/2014  . Lower extremity edema 02/21/2014  . Chronic diastolic congestive heart failure ([place]) 11/21/2013  . CAD (coronary artery disease) 11/21/2013  . Alcohol abuse 11/21/2013  . GERD (gastroesophageal reflux disease) 08/10/2013  . Gout 08/10/2013  . Tobacco abuse 07/26/2013  . Trichiasis without entropion 04/16/2013  . Dizziness 04/10/2013  . Eyelid lesion 01/24/2013  . Enophthalmos due to trauma 01/19/2013  . Medial orbital wall fracture (HCC) 01/19/2013  . Closed blow-out fracture of floor of orbit ([place]) 01/19/2013  . Binocular vision disorder with diplopia 01/03/2013  . Fracture of inferior orbital wall ([place]) 01/03/2013  . Fracture of orbital floor ([place]) 01/03/2013  . Pain of left eye 01/03/2013  . Peripheral vascular disease ([place]) 07/27/2012  . COPD (chronic obstructive pulmonary disease) ([place]) 04/04/2012  . HTN (hypertension) 04/04/2012   Past Medical History:  Diagnosis Date  . Alcohol abuse    H/O  . Anxiety   . Arthritis   . Asthma   . CAD (coronary artery disease) 05/27/10   Cath: severe single vessell CAD left cx midportion obtuse marginal 2 to 3.  [name] CHF (congestive heart failure) ([place])   . COPD (chronic obstructive pulmonary disease) ([place])   . COPD (chronic obstructive pulmonary disease) ([place])   . Depression   .  Diabetes mellitus, type 2 ([place]) 03/13/2015  . GERD (gastroesophageal reflux disease)   . HCAP (healthcare-associated pneumonia) 10/15/2015  . History of DVT of lower extremity   . Hyperlipemia   . Hyperlipidemia   . Hypertension   . Myocardial infarction ([place]) 2000  . Orbital fracture ([place]) 12/2012  . OSA on CPAP   . Peripheral vascular disease, unspecified ([place])    08/20/10 doppler: increase in right ABI post-op. Left ABI stable. S/P bi-fem bypass surgery  . Pneumonia 04/04/2012  . Shortness of breath   . Stroke ([place])   . Tobacco abuse     Family History  Problem Relation Age of Onset  . Heart attack Mother   . Heart failure Mother   . Cirrhosis Father   . Heart failure Brother   . Cancer Brother   . Hypertension Neg Hx        UNKNOWN  . Stroke Neg Hx        UNKNOWN    Past Surgical History:  Procedure Laterality  Date  . abdoninal ao angio & bifem angio  05/27/10   Patent graft, occluded bil stents with no retrograde flow into the hypogastric arteries. 100% occl left ant. tibial artery, 70% to 80% to 100% stenosis right superficial fem artery above adductor canal. 100 % occl right ant. tibial vessell  . Aortogram w/ PTCA  09/292003  . BACK SURGERY    . CARDIAC CATHETERIZATION  05/27/10   severe CAD left cx  . CHOLECYSTECTOMY    . ESOPHAGOGASTRIC FUNDOPLICATION    . EYE SURGERY    . FEMORAL BYPASS  08/19/10   Right Fem-Pop  . IR ANGIO INTRA EXTRACRAN SEL COM CAROTID INNOMINATE BILAT MOD SED  07/20/2016  . IR ANGIO VERTEBRAL SEL SUBCLAVIAN INNOMINATE UNI R MOD SED  07/20/2016  . IR ANGIO VERTEBRAL SEL VERTEBRAL UNI L MOD SED  07/20/2016  . IR RADIOLOGIST EVAL & MGMT  05/27/2016  . JOINT REPLACEMENT    . PERIPHERAL VASCULAR CATHETERIZATION N/A 12/10/2014   Procedure: Abdominal Aortogram;  Surgeon: Angelia Mould, MD;  Location: [facility];  Service: Cardiovascular;  Laterality: N/A;  . RADIOLOGY WITH ANESTHESIA N/A 02/08/2015   Procedure: RADIOLOGY WITH  ANESTHESIA;  Surgeon: Luanne Bras, MD;  Location: [facility];  Service: Radiology;  Laterality: N/A;  . TEE WITHOUT CARDIOVERSION N/A 08/29/2014   Procedure: TRANSESOPHAGEAL ECHOCARDIOGRAM (TEE);  Surgeon: Josue Hector, MD;  Location: [facility];  Service: Cardiovascular;  Laterality: N/A;  . TOTAL KNEE ARTHROPLASTY     Social History   Occupational History  . Not on file  Tobacco Use  . Smoking status: Current Every Day Smoker    Packs/day: 0.50    Years: 50.00    Pack years: 25.00    Types: Cigars, Cigarettes    Start date: 02/09/1961  . Smokeless tobacco: Former Systems developer    Types: Chew  . Tobacco comment: 2 ppd, full flavor  Substance and Sexual Activity  . Alcohol use: Yes  . Drug use: No  . Sexual activity: Yes

## 2017-10-04 DIAGNOSIS — J449 Chronic obstructive pulmonary disease, unspecified: Secondary | ICD-10-CM | POA: Diagnosis not present

## 2017-10-05 ENCOUNTER — Telehealth: Payer: Self-pay | Admitting: Family Medicine

## 2017-10-05 ENCOUNTER — Other Ambulatory Visit: Payer: Self-pay | Admitting: Family Medicine

## 2017-10-05 DIAGNOSIS — J449 Chronic obstructive pulmonary disease, unspecified: Secondary | ICD-10-CM

## 2017-10-05 NOTE — Telephone Encounter (Signed)
Patient called requesting for a nurse to call him, please follow up with patient.

## 2017-10-07 ENCOUNTER — Other Ambulatory Visit: Payer: Self-pay | Admitting: Family Medicine

## 2017-10-07 DIAGNOSIS — E1149 Type 2 diabetes mellitus with other diabetic neurological complication: Secondary | ICD-10-CM

## 2017-10-07 NOTE — Telephone Encounter (Signed)
1) Medication(s) Requested (by name):furosemide (LASIX) 20 MG tablet [[id number]]  pregabalin (LYRICA) 150 MG capsule [[id number]]    2) Pharmacy of Choice: send to [facility]  3) Special Requests: Patient is asking for a 90 day supply of both.    Approved medications will be sent to the pharmacy, we will reach out if there is an issue.  Requests made after 3pm may not be addressed until the following business day!  If a patient is unsure of the name of the medication(s) please note and ask patient to call back when they are able to provide all info, do not send to responsible party until all information is available!

## 2017-10-07 NOTE — Telephone Encounter (Signed)
Patient was call and informed to contact office.

## 2017-10-08 ENCOUNTER — Other Ambulatory Visit: Payer: Self-pay | Admitting: Family Medicine

## 2017-10-08 DIAGNOSIS — E1149 Type 2 diabetes mellitus with other diabetic neurological complication: Secondary | ICD-10-CM

## 2017-10-08 MED ORDER — FUROSEMIDE 20 MG PO TABS: 40.0000 mg | ORAL_TABLET | Freq: Two times a day (BID) | ORAL | 0 refills | Status: DC

## 2017-10-08 MED ORDER — PREGABALIN 150 MG PO CAPS: 150.0000 mg | ORAL_CAPSULE | Freq: Two times a day (BID) | ORAL | 0 refills | Status: DC

## 2017-10-12 ENCOUNTER — Other Ambulatory Visit: Payer: Self-pay

## 2017-10-12 NOTE — Patient Outreach (Signed)
[facility]) Care Management  10/12/2017  [name] 04-18-1950 [id number]   Medication Adherence call to [name] left a message for patient to call back patient is showing past due on Atorvastatin 80 mg and Lisinopril 5 mg. [name] is showing past due under [name].   [facility] Management Direct Dial [phone]  Fax [phone] [email or web address]

## 2017-10-25 ENCOUNTER — Encounter (HOSPITAL_COMMUNITY): Payer: Self-pay | Admitting: Emergency Medicine

## 2017-10-25 ENCOUNTER — Emergency Department (HOSPITAL_COMMUNITY): Payer: Medicare Other

## 2017-10-25 ENCOUNTER — Inpatient Hospital Stay (HOSPITAL_COMMUNITY)
Admission: EM | Admit: 2017-10-25 | Discharge: 2017-10-29 | DRG: 065 | Disposition: A | Discharge status: Skilled Nursing Facility | Payer: Medicare Other | Attending: Family Medicine | Admitting: Family Medicine

## 2017-10-25 DIAGNOSIS — E1151 Type 2 diabetes mellitus with diabetic peripheral angiopathy without gangrene: Secondary | ICD-10-CM | POA: Diagnosis present

## 2017-10-25 DIAGNOSIS — I639 Cerebral infarction, unspecified: Principal | ICD-10-CM | POA: Diagnosis present

## 2017-10-25 DIAGNOSIS — I69354 Hemiplegia and hemiparesis following cerebral infarction affecting left non-dominant side: Secondary | ICD-10-CM | POA: Diagnosis not present

## 2017-10-25 DIAGNOSIS — I252 Old myocardial infarction: Secondary | ICD-10-CM

## 2017-10-25 DIAGNOSIS — M109 Gout, unspecified: Secondary | ICD-10-CM | POA: Diagnosis not present

## 2017-10-25 DIAGNOSIS — Z79899 Other long term (current) drug therapy: Secondary | ICD-10-CM | POA: Diagnosis not present

## 2017-10-25 DIAGNOSIS — Z66 Do not resuscitate: Secondary | ICD-10-CM | POA: Diagnosis not present

## 2017-10-25 DIAGNOSIS — Z7951 Long term (current) use of inhaled steroids: Secondary | ICD-10-CM

## 2017-10-25 DIAGNOSIS — J449 Chronic obstructive pulmonary disease, unspecified: Secondary | ICD-10-CM | POA: Diagnosis present

## 2017-10-25 DIAGNOSIS — E7800 Pure hypercholesterolemia, unspecified: Secondary | ICD-10-CM | POA: Diagnosis not present

## 2017-10-25 DIAGNOSIS — R29818 Other symptoms and signs involving the nervous system: Secondary | ICD-10-CM | POA: Diagnosis not present

## 2017-10-25 DIAGNOSIS — I5033 Acute on chronic diastolic (congestive) heart failure: Secondary | ICD-10-CM | POA: Diagnosis not present

## 2017-10-25 DIAGNOSIS — F17200 Nicotine dependence, unspecified, uncomplicated: Secondary | ICD-10-CM | POA: Diagnosis present

## 2017-10-25 DIAGNOSIS — M1000 Idiopathic gout, unspecified site: Secondary | ICD-10-CM | POA: Diagnosis not present

## 2017-10-25 DIAGNOSIS — I110 Hypertensive heart disease with heart failure: Secondary | ICD-10-CM | POA: Diagnosis present

## 2017-10-25 DIAGNOSIS — I6389 Other cerebral infarction: Secondary | ICD-10-CM | POA: Diagnosis not present

## 2017-10-25 DIAGNOSIS — Z885 Allergy status to narcotic agent status: Secondary | ICD-10-CM | POA: Diagnosis not present

## 2017-10-25 DIAGNOSIS — E118 Type 2 diabetes mellitus with unspecified complications: Secondary | ICD-10-CM

## 2017-10-25 DIAGNOSIS — I5032 Chronic diastolic (congestive) heart failure: Secondary | ICD-10-CM | POA: Diagnosis not present

## 2017-10-25 DIAGNOSIS — F1410 Cocaine abuse, uncomplicated: Secondary | ICD-10-CM | POA: Diagnosis present

## 2017-10-25 DIAGNOSIS — Z86718 Personal history of other venous thrombosis and embolism: Secondary | ICD-10-CM | POA: Diagnosis not present

## 2017-10-25 DIAGNOSIS — I63232 Cerebral infarction due to unspecified occlusion or stenosis of left carotid arteries: Secondary | ICD-10-CM | POA: Diagnosis not present

## 2017-10-25 DIAGNOSIS — R2681 Unsteadiness on feet: Secondary | ICD-10-CM | POA: Diagnosis not present

## 2017-10-25 DIAGNOSIS — G459 Transient cerebral ischemic attack, unspecified: Secondary | ICD-10-CM

## 2017-10-25 DIAGNOSIS — F0280 Dementia in other diseases classified elsewhere without behavioral disturbance: Secondary | ICD-10-CM | POA: Diagnosis not present

## 2017-10-25 DIAGNOSIS — K219 Gastro-esophageal reflux disease without esophagitis: Secondary | ICD-10-CM | POA: Diagnosis present

## 2017-10-25 DIAGNOSIS — I63031 Cerebral infarction due to thrombosis of right carotid artery: Secondary | ICD-10-CM | POA: Diagnosis not present

## 2017-10-25 DIAGNOSIS — H538 Other visual disturbances: Secondary | ICD-10-CM | POA: Diagnosis present

## 2017-10-25 DIAGNOSIS — F321 Major depressive disorder, single episode, moderate: Secondary | ICD-10-CM | POA: Diagnosis not present

## 2017-10-25 DIAGNOSIS — R531 Weakness: Secondary | ICD-10-CM | POA: Diagnosis not present

## 2017-10-25 DIAGNOSIS — F17290 Nicotine dependence, other tobacco product, uncomplicated: Secondary | ICD-10-CM | POA: Diagnosis present

## 2017-10-25 DIAGNOSIS — F32A Depression, unspecified: Secondary | ICD-10-CM | POA: Diagnosis present

## 2017-10-25 DIAGNOSIS — F0390 Unspecified dementia without behavioral disturbance: Secondary | ICD-10-CM | POA: Diagnosis present

## 2017-10-25 DIAGNOSIS — Z7902 Long term (current) use of antithrombotics/antiplatelets: Secondary | ICD-10-CM

## 2017-10-25 DIAGNOSIS — G8194 Hemiplegia, unspecified affecting left nondominant side: Secondary | ICD-10-CM | POA: Diagnosis not present

## 2017-10-25 DIAGNOSIS — Z888 Allergy status to other drugs, medicaments and biological substances status: Secondary | ICD-10-CM

## 2017-10-25 DIAGNOSIS — I480 Paroxysmal atrial fibrillation: Secondary | ICD-10-CM | POA: Diagnosis not present

## 2017-10-25 DIAGNOSIS — I2510 Atherosclerotic heart disease of native coronary artery without angina pectoris: Secondary | ICD-10-CM | POA: Diagnosis not present

## 2017-10-25 DIAGNOSIS — F329 Major depressive disorder, single episode, unspecified: Secondary | ICD-10-CM | POA: Diagnosis present

## 2017-10-25 DIAGNOSIS — E785 Hyperlipidemia, unspecified: Secondary | ICD-10-CM | POA: Diagnosis present

## 2017-10-25 DIAGNOSIS — Z743 Need for continuous supervision: Secondary | ICD-10-CM | POA: Diagnosis not present

## 2017-10-25 DIAGNOSIS — I350 Nonrheumatic aortic (valve) stenosis: Secondary | ICD-10-CM | POA: Diagnosis not present

## 2017-10-25 DIAGNOSIS — I959 Hypotension, unspecified: Secondary | ICD-10-CM | POA: Diagnosis not present

## 2017-10-25 DIAGNOSIS — R0902 Hypoxemia: Secondary | ICD-10-CM | POA: Diagnosis not present

## 2017-10-25 DIAGNOSIS — F1010 Alcohol abuse, uncomplicated: Secondary | ICD-10-CM | POA: Diagnosis present

## 2017-10-25 DIAGNOSIS — H1010 Acute atopic conjunctivitis, unspecified eye: Secondary | ICD-10-CM | POA: Diagnosis not present

## 2017-10-25 DIAGNOSIS — G4733 Obstructive sleep apnea (adult) (pediatric): Secondary | ICD-10-CM | POA: Diagnosis present

## 2017-10-25 DIAGNOSIS — R9082 White matter disease, unspecified: Secondary | ICD-10-CM | POA: Diagnosis not present

## 2017-10-25 DIAGNOSIS — M6281 Muscle weakness (generalized): Secondary | ICD-10-CM | POA: Diagnosis not present

## 2017-10-25 DIAGNOSIS — R279 Unspecified lack of coordination: Secondary | ICD-10-CM | POA: Diagnosis not present

## 2017-10-25 DIAGNOSIS — R000 Tachycardia, unspecified: Secondary | ICD-10-CM | POA: Diagnosis not present

## 2017-10-25 DIAGNOSIS — R4781 Slurred speech: Secondary | ICD-10-CM | POA: Diagnosis present

## 2017-10-25 DIAGNOSIS — G2581 Restless legs syndrome: Secondary | ICD-10-CM | POA: Diagnosis not present

## 2017-10-25 DIAGNOSIS — I10 Essential (primary) hypertension: Secondary | ICD-10-CM | POA: Diagnosis not present

## 2017-10-25 LAB — I-STAT CHEM 8, ED
BUN: 15 mg/dL (ref 8–23)
Calcium, Ion: 1.16 mmol/L (ref 1.15–1.40)
Chloride: 102 mmol/L (ref 98–111)
Creatinine, Ser: 0.9 mg/dL (ref 0.61–1.24)
Glucose, Bld: 116 mg/dL — ABNORMAL HIGH (ref 70–99)
HCT: 43 % (ref 39.0–52.0)
Hemoglobin: 14.6 g/dL (ref 13.0–17.0)
Potassium: 4.2 mmol/L (ref 3.5–5.1)
Sodium: 142 mmol/L (ref 135–145)
TCO2: 29 mmol/L (ref 22–32)

## 2017-10-25 LAB — DIFFERENTIAL
Abs Immature Granulocytes: 0 10*3/uL (ref 0.0–0.1)
Basophils Absolute: 0.1 10*3/uL (ref 0.0–0.1)
Basophils Relative: 1 %
Eosinophils Absolute: 0.2 10*3/uL (ref 0.0–0.7)
Eosinophils Relative: 3 %
Immature Granulocytes: 1 %
Lymphocytes Relative: 24 %
Lymphs Abs: 1.7 10*3/uL (ref 0.7–4.0)
Monocytes Absolute: 0.8 10*3/uL (ref 0.1–1.0)
Monocytes Relative: 11 %
Neutro Abs: 4.4 10*3/uL (ref 1.7–7.7)
Neutrophils Relative %: 60 %

## 2017-10-25 LAB — COMPREHENSIVE METABOLIC PANEL
ALT: 14 U/L (ref 0–44)
AST: 16 U/L (ref 15–41)
Albumin: 3.7 g/dL (ref 3.5–5.0)
Alkaline Phosphatase: 71 U/L (ref 38–126)
Anion gap: 10 (ref 5–15)
BUN: 14 mg/dL (ref 8–23)
CO2: 29 mmol/L (ref 22–32)
Calcium: 9.5 mg/dL (ref 8.9–10.3)
Chloride: 103 mmol/L (ref 98–111)
Creatinine, Ser: 1 mg/dL (ref 0.61–1.24)
GFR calc Af Amer: >60 mL/min (ref >60)
GFR calc non Af Amer: >60 mL/min (ref >60)
Glucose, Bld: 120 mg/dL — ABNORMAL HIGH (ref 70–99)
Potassium: 4.2 mmol/L (ref 3.5–5.1)
Sodium: 142 mmol/L (ref 135–145)
Total Bilirubin: 0.4 mg/dL (ref 0.3–1.2)
Total Protein: 6.2 g/dL — ABNORMAL LOW (ref 6.5–8.1)

## 2017-10-25 LAB — CBC
HCT: 45 % (ref 39.0–52.0)
Hemoglobin: 14 g/dL (ref 13.0–17.0)
MCH: 29.4 pg (ref 26.0–34.0)
MCHC: 31.1 g/dL (ref 30.0–36.0)
MCV: 94.5 fL (ref 78.0–100.0)
Platelets: 176 10*3/uL (ref 150–400)
RBC: 4.76 MIL/uL (ref 4.22–5.81)
RDW: 14.3 % (ref 11.5–15.5)
WBC: 7.1 10*3/uL (ref 4.0–10.5)

## 2017-10-25 LAB — ETHANOL: Alcohol, Ethyl (B): <10 mg/dL (ref <10)

## 2017-10-25 LAB — I-STAT TROPONIN, ED: Troponin i, poc: 0 ng/mL (ref 0.00–0.08)

## 2017-10-25 LAB — PROTIME-INR
INR: 0.98
Prothrombin Time: 12.9 seconds (ref 11.4–15.2)

## 2017-10-25 LAB — APTT: aPTT: 28 seconds (ref 24–36)

## 2017-10-25 LAB — CBG MONITORING, ED: Glucose-Capillary: 111 mg/dL — ABNORMAL HIGH (ref 70–99)

## 2017-10-25 MED ORDER — THIAMINE HCL 100 MG/ML IJ SOLN
100.0000 mg | Freq: Every day | INTRAMUSCULAR | Status: DC
  Administered 2017-10-26: 100 mg via INTRAVENOUS
  Filled 2017-10-25: qty 2

## 2017-10-25 MED ORDER — LORAZEPAM 2 MG/ML IJ SOLN: 0.0000 mg | Freq: Two times a day (BID) | INTRAMUSCULAR | Status: DC

## 2017-10-25 MED ORDER — ENOXAPARIN SODIUM 40 MG/0.4ML ~~LOC~~ SOLN
40.0000 mg | Freq: Every day | SUBCUTANEOUS | Status: DC
  Administered 2017-10-27 – 2017-10-29 (×3): 40 mg via SUBCUTANEOUS
  Filled 2017-10-25 (×4): qty 0.4

## 2017-10-25 MED ORDER — HYDRALAZINE HCL 20 MG/ML IJ SOLN: 5.0000 mg | INTRAMUSCULAR | Status: DC | PRN

## 2017-10-25 MED ORDER — IOPAMIDOL (ISOVUE-370) INJECTION 76%
100.0000 mL | Freq: Once | INTRAVENOUS | Status: AC | PRN
  Administered 2017-10-25: 90 mL via INTRAVENOUS

## 2017-10-25 MED ORDER — FOLIC ACID 1 MG PO TABS
1.0000 mg | ORAL_TABLET | Freq: Every day | ORAL | Status: DC
  Administered 2017-10-27 – 2017-10-29 (×3): 1 mg via ORAL
  Filled 2017-10-25 (×3): qty 1

## 2017-10-25 MED ORDER — NICOTINE 21 MG/24HR TD PT24
21.0000 mg | MEDICATED_PATCH | Freq: Every day | TRANSDERMAL | Status: DC
  Administered 2017-10-26 – 2017-10-29 (×5): 21 mg via TRANSDERMAL
  Filled 2017-10-25 (×5): qty 1

## 2017-10-25 MED ORDER — LORAZEPAM 2 MG/ML IJ SOLN: 1.0000 mg | Freq: Four times a day (QID) | INTRAMUSCULAR | Status: DC | PRN

## 2017-10-25 MED ORDER — VITAMIN B-1 100 MG PO TABS
100.0000 mg | ORAL_TABLET | Freq: Every day | ORAL | Status: DC
  Administered 2017-10-27 – 2017-10-29 (×3): 100 mg via ORAL
  Filled 2017-10-25 (×3): qty 1

## 2017-10-25 MED ORDER — SENNOSIDES-DOCUSATE SODIUM 8.6-50 MG PO TABS: 1.0000 | ORAL_TABLET | Freq: Every evening | ORAL | Status: DC | PRN

## 2017-10-25 MED ORDER — LORAZEPAM 1 MG PO TABS: 1.0000 mg | ORAL_TABLET | Freq: Four times a day (QID) | ORAL | Status: DC | PRN

## 2017-10-25 MED ORDER — ASPIRIN 325 MG PO TABS
325.0000 mg | ORAL_TABLET | Freq: Every day | ORAL | Status: DC
  Administered 2017-10-27 – 2017-10-29 (×3): 325 mg via ORAL
  Filled 2017-10-25 (×3): qty 1

## 2017-10-25 MED ORDER — LORAZEPAM 2 MG/ML IJ SOLN: 0.0000 mg | Freq: Four times a day (QID) | INTRAMUSCULAR | Status: DC

## 2017-10-25 MED ORDER — ALBUTEROL SULFATE (2.5 MG/3ML) 0.083% IN NEBU
2.5000 mg | INHALATION_SOLUTION | RESPIRATORY_TRACT | Status: DC | PRN
  Administered 2017-10-26: 2.5 mg via RESPIRATORY_TRACT
  Filled 2017-10-25: qty 3

## 2017-10-25 MED ORDER — SODIUM CHLORIDE 0.9 % IV SOLN
INTRAVENOUS | Status: DC
  Administered 2017-10-26 – 2017-10-27 (×3): via INTRAVENOUS

## 2017-10-25 MED ORDER — STROKE: EARLY STAGES OF RECOVERY BOOK
Freq: Once | Status: DC
  Filled 2017-10-25: qty 1

## 2017-10-25 MED ORDER — ASPIRIN 300 MG RE SUPP
300.0000 mg | Freq: Every day | RECTAL | Status: DC
  Administered 2017-10-26: 300 mg via RECTAL
  Filled 2017-10-25: qty 1

## 2017-10-25 MED ORDER — ADULT MULTIVITAMIN W/MINERALS CH
1.0000 | ORAL_TABLET | Freq: Every day | ORAL | Status: DC
  Administered 2017-10-27 – 2017-10-29 (×3): 1 via ORAL
  Filled 2017-10-25 (×3): qty 1

## 2017-10-25 NOTE — ED Triage Notes (Signed)
BIB EMS from home, LKW unknown. Pt states around 1830 he noticed his L side was weaker than normal. Pt friend checked on him later and noted he was drooling and had L sided facial droop so they called 911. Pt has hx of stroke and has L sided weakness from prior stroke. VSS. A/OX4. CBG 111.

## 2017-10-25 NOTE — ED Provider Notes (Addendum)
[facility] EMERGENCY DEPARTMENT Provider Note   CSN: [id number] Arrival date & time: 10/25/17  2057     History   Chief Complaint Chief Complaint  Patient presents with  . Code Stroke    HPI [name] is a 67 y.o. male.  HPI   68 year old male brought in by EMS as [place] Stroke.  Last known normal unknown.  Patient states today was unable to give an exact time.  At around 1830 and noticed that his left side was weaker than normal.  He has residual left-sided weakness from a prior stroke but states that his weakness is more profound than typical.  He also feels like his speech is slurred.  He normally ambulates with a cane.  Denies any acute pain.  No acute visual changes.  Past Medical History:  Diagnosis Date  . Alcohol abuse    H/O  . Anxiety   . Arthritis   . Asthma   . CAD (coronary artery disease) 05/27/10   Cath: severe single vessell CAD left cx midportion obtuse marginal 2 to 3.  [name] CHF (congestive heart failure) ([place])   . COPD (chronic obstructive pulmonary disease) ([place])   . COPD (chronic obstructive pulmonary disease) ([place])   . Depression   . Diabetes mellitus, type 2 ([place]) 03/13/2015  . GERD (gastroesophageal reflux disease)   . HCAP (healthcare-associated pneumonia) 10/15/2015  . History of DVT of lower extremity   . Hyperlipemia   . Hyperlipidemia   . Hypertension   . Myocardial infarction ([place]) 2000  . Orbital fracture ([place]) 12/2012  . OSA on CPAP   . Peripheral vascular disease, unspecified ([place])    08/20/10 doppler: increase in right ABI post-op. Left ABI stable. S/P bi-fem bypass surgery  . Pneumonia 04/04/2012  . Shortness of breath   . Stroke ([place])   . Tobacco abuse     Patient Active Problem List   Diagnosis Date Noted  . Left shoulder pain 09/28/2017  . Cerebral thrombosis with cerebral infarction 07/18/2016  . Acute ischemic stroke ([place])   . Left hemiplegia ([place])   . Stroke-like symptoms 07/17/2016  . Insomnia  01/29/2016  . Depression 01/29/2016  . Alcohol abuse with intoxication ([place]) 10/16/2015  . Psoriasiform dermatitis 07/12/2015  . Dementia due to another medical condition 07/12/2015  . Urinary incontinence 03/14/2015  . Diabetes mellitus, type 2 ([place]) 03/13/2015  . Hemiparesis affecting left side as late effect of cerebrovascular accident ([place]) 02/18/2015  . Cerebrovascular accident (CVA) due to stenosis of right carotid artery ([place])   . Coronary artery disease involving native coronary artery of native heart without angina pectoris   . Tachypnea   . Thrombocytopenia ([place])   . History of stroke   . Stroke (cerebrum) ([place]) 02/06/2015  . Arterial ischemic stroke, MCA (middle cerebral artery), right, acute ([place])   . DOE (dyspnea on exertion) 11/23/2014  . PAF (paroxysmal atrial fibrillation) ([place]) 11/23/2014  . HLD (hyperlipidemia) 11/01/2014  . Tobacco use disorder 11/01/2014  . Diastolic CHF, acute on chronic (HCC) 11/01/2014  . Cerebral infarction due to stenosis of right carotid artery ([place]) 11/01/2014  . Carotid stenosis 11/01/2014  . Hyperkalemia 10/24/2014  . H/O total knee replacement 10/24/2014  . Essential hypertension   . Polysubstance abuse ([place]) 08/28/2014  . Cocaine abuse ([place])   . Intracranial carotid stenosis   . Headache around the eyes 08/27/2014  . Acute CVA (cerebrovascular accident) ([place]) 08/27/2014  . CVA (cerebral vascular accident) ([place]) 08/26/2014  .  History of DVT (deep vein thrombosis)   . Alcoholism ([place])   . Embolic stroke ([place])   . Cerebral infarction due to unspecified mechanism   . Hypotension   . AKI (acute kidney injury) ([place])   . Stroke ([place])   . CVA (cerebral infarction) 08/01/2014  . Alcohol dependence with withdrawal with complication ([place]) 81/19/1478  . DVT (deep venous thrombosis) ([place]) 02/22/2014  . Lower extremity edema 02/21/2014  . Chronic diastolic congestive heart failure ([place]) 11/21/2013  . CAD (coronary artery disease) 11/21/2013   . Alcohol abuse 11/21/2013  . GERD (gastroesophageal reflux disease) 08/10/2013  . Gout 08/10/2013  . Tobacco abuse 07/26/2013  . Trichiasis without entropion 04/16/2013  . Dizziness 04/10/2013  . Eyelid lesion 01/24/2013  . Enophthalmos due to trauma 01/19/2013  . Medial orbital wall fracture (HCC) 01/19/2013  . Closed blow-out fracture of floor of orbit ([place]) 01/19/2013  . Binocular vision disorder with diplopia 01/03/2013  . Fracture of inferior orbital wall ([place]) 01/03/2013  . Fracture of orbital floor ([place]) 01/03/2013  . Pain of left eye 01/03/2013  . Peripheral vascular disease ([place]) 07/27/2012  . COPD (chronic obstructive pulmonary disease) ([place]) 04/04/2012  . HTN (hypertension) 04/04/2012    Past Surgical History:  Procedure Laterality Date  . abdoninal ao angio & bifem angio  05/27/10   Patent graft, occluded bil stents with no retrograde flow into the hypogastric arteries. 100% occl left ant. tibial artery, 70% to 80% to 100% stenosis right superficial fem artery above adductor canal. 100 % occl right ant. tibial vessell  . Aortogram w/ PTCA  09/292003  . BACK SURGERY    . CARDIAC CATHETERIZATION  05/27/10   severe CAD left cx  . CHOLECYSTECTOMY    . ESOPHAGOGASTRIC FUNDOPLICATION    . EYE SURGERY    . FEMORAL BYPASS  08/19/10   Right Fem-Pop  . IR ANGIO INTRA EXTRACRAN SEL COM CAROTID INNOMINATE BILAT MOD SED  07/20/2016  . IR ANGIO VERTEBRAL SEL SUBCLAVIAN INNOMINATE UNI R MOD SED  07/20/2016  . IR ANGIO VERTEBRAL SEL VERTEBRAL UNI L MOD SED  07/20/2016  . IR RADIOLOGIST EVAL & MGMT  05/27/2016  . JOINT REPLACEMENT    . PERIPHERAL VASCULAR CATHETERIZATION N/A 12/10/2014   Procedure: Abdominal Aortogram;  Surgeon: Angelia Mould, MD;  Location: [facility];  Service: Cardiovascular;  Laterality: N/A;  . RADIOLOGY WITH ANESTHESIA N/A 02/08/2015   Procedure: RADIOLOGY WITH ANESTHESIA;  Surgeon: Luanne Bras, MD;  Location: [facility];  Service: Radiology;   Laterality: N/A;  . TEE WITHOUT CARDIOVERSION N/A 08/29/2014   Procedure: TRANSESOPHAGEAL ECHOCARDIOGRAM (TEE);  Surgeon: Josue Hector, MD;  Location: [facility] ENDOSCOPY;  Service: Cardiovascular;  Laterality: N/A;  . TOTAL KNEE ARTHROPLASTY          Home Medications    Prior to Admission medications   Medication Sig Start Date End Date Taking? Authorizing Provider  ACCU-CHEK SOFTCLIX LANCETS lancets Use 3 times daily before meals 09/17/15   Newlin, Charlane Ferretti, MD  albuterol (PROAIR HFA) 108 (90 Base) MCG/ACT inhaler INHALE 2 PUFFS EVERY 6 HOURS AS NEEDED FOR WHEEZING OR SHORTNESS OF BREATH 10/05/17   Charlott Rakes, MD  albuterol (PROVENTIL) (2.5 MG/3ML) 0.083% nebulizer solution Take 3 mLs (2.5 mg total) by nebulization every 6 (six) hours as needed for wheezing or shortness of breath. 75/12=7 Patient taking differently: Take 2.5 mg by nebulization every 6 (six) hours as needed for wheezing or shortness of breath.  05/20/17   Charlott Rakes, MD  allopurinol (ZYLOPRIM) 300 MG tablet Take 1 tablet (300 mg total) by mouth daily. 08/19/17   Charlott Rakes, MD  atorvastatin (LIPITOR) 80 MG tablet Take 1 tablet (80 mg total) by mouth daily at 6 PM. 08/19/17   Charlott Rakes, MD  Blood Glucose Monitoring Suppl (ACCU-CHEK AVIVA) device Use 3 times daily before meals. 03/13/15   Charlott Rakes, MD  carvedilol (COREG) 12.5 MG tablet Take 1 tablet (12.5 mg total) by mouth 2 (two) times daily with a meal. 08/19/17   Charlott Rakes, MD  clopidogrel (PLAVIX) 75 MG tablet Take 1 tablet (75 mg total) by mouth daily. 08/19/17   Charlott Rakes, MD  DULoxetine (CYMBALTA) 60 MG capsule Take 1 capsule (60 mg total) by mouth daily. 08/19/17   Charlott Rakes, MD  Fluticasone-Salmeterol (ADVAIR DISKUS) 250-50 MCG/DOSE AEPB Inhale 1 puff into the lungs 2 (two) times daily. 08/19/17   Charlott Rakes, MD  furosemide (LASIX) 20 MG tablet Take 2 tablets (40 mg total) by mouth 2 (two) times daily. 10/08/17   Charlott Rakes, MD    glucose blood (ACCU-CHEK AVIVA) test strip Use 3 times daily before meals as directed. 01/29/16   Charlott Rakes, MD  lactulose (CHRONULAC) 10 GM/15ML solution Take 15 mLs (10 g total) by mouth 2 (two) times daily as needed for mild constipation. 08/19/17   Charlott Rakes, MD  Lancet Devices [facility]) lancets Use 3 times daily before meals 09/27/15   Charlott Rakes, MD  lisinopril (PRINIVIL,ZESTRIL) 5 MG tablet Take 1 tablet (5 mg total) by mouth daily. 08/19/17   Charlott Rakes, MD  memantine (NAMENDA) 10 MG tablet Take 1 tablet (10 mg total) by mouth 2 (two) times daily. 08/19/17   Charlott Rakes, MD  Misc. Devices (ROLLATOR) MISC 1 each by Does not apply route daily. 08/19/17   Charlott Rakes, MD  pantoprazole (PROTONIX) 40 MG tablet TAKE 1 TABLET BY MOUTH EVERY MORNING 08/17/17   Charlott Rakes, MD  polyethylene glycol (MIRALAX / GLYCOLAX) packet Take 17 g by mouth daily. 07/22/16   Diallo, Earna Coder, MD  pregabalin (LYRICA) 150 MG capsule Take 1 capsule (150 mg total) by mouth 2 (two) times daily. 10/08/17   Charlott Rakes, MD  rOPINIRole (REQUIP) 0.5 MG tablet Take 1 tablet (0.5 mg total) by mouth at bedtime. 08/19/17   Charlott Rakes, MD  Selenium Sulfide 2.25 % SHAM Apply 1 application topically 2 (two) times a week. 01/28/17   Charlott Rakes, MD  tiZANidine (ZANAFLEX) 4 MG tablet Take 1 tablet (4 mg total) by mouth every 8 (eight) hours as needed for muscle spasms. 08/19/17   Charlott Rakes, MD  traZODone (DESYREL) 150 MG tablet Take 1 tablet (150 mg total) by mouth at bedtime as needed for sleep. 08/19/17   Charlott Rakes, MD  triamcinolone cream (KENALOG) 0.1 % Apply 1 application topically daily as needed (for dermatitis). 06/02/16   Charlott Rakes, MD    Family History Family History  Problem Relation Age of Onset  . Heart attack Mother   . Heart failure Mother   . Cirrhosis Father   . Heart failure Brother   . Cancer Brother   . Hypertension Neg Hx        UNKNOWN   . Stroke Neg Hx        UNKNOWN    Social History Social History   Tobacco Use  . Smoking status: Current Every Day Smoker    Packs/day: 0.50    Years: 50.00    Pack years: 25.00  Types: Cigars, Cigarettes    Start date: 02/09/1961  . Smokeless tobacco: Former Systems developer    Types: Chew  . Tobacco comment: 2 ppd, full flavor  Substance Use Topics  . Alcohol use: Yes  . Drug use: No     Allergies   Darvocet [propoxyphene n-acetaminophen]; Haldol [haloperidol decanoate]; Acetaminophen; Metformin and related; and Norco [hydrocodone-acetaminophen]   Review of Systems Review of Systems  All systems reviewed and negative, other than as noted in HPI.  Physical Exam Updated Vital Signs BP (!) 142/86 (BP Location: Left Arm)   Pulse 82   Temp 97.6 F (36.4 C) (Temporal)   Resp 18   Ht 5\' 10"  (1.778 m)   Wt 103.5 kg   SpO2 96%   BMI 32.74 kg/m   Physical Exam  Constitutional: He appears well-developed and well-nourished. No distress.  HENT:  Head: Normocephalic and atraumatic.  Eyes: Conjunctivae are normal. Right eye exhibits no discharge. Left eye exhibits no discharge.  Neck: Neck supple.  Cardiovascular: Normal rate, regular rhythm and normal heart sounds. Exam reveals no gallop and no friction rub.  No murmur heard. Pulmonary/Chest: Effort normal and breath sounds normal. No respiratory distress.  Abdominal: Soft. He exhibits no distension. There is no tenderness.  Musculoskeletal: He exhibits no edema or tenderness.  Neurological: He is alert.  Speech dysarthric but understandable. Strength 3/5 L u/l ext. 5/5 R side.   Skin: Skin is warm and dry.  Psychiatric: He has a normal mood and affect. His behavior is normal. Thought content normal.  Nursing note and vitals reviewed.    ED Treatments / Results  Labs (all labs ordered are listed, but only abnormal results are displayed) Labs Reviewed  COMPREHENSIVE METABOLIC PANEL - Abnormal; Notable for the following  components:      Result Value   Glucose, Bld 120 (*)    Total Protein 6.2 (*)    All other components within normal limits  I-STAT CHEM 8, ED - Abnormal; Notable for the following components:   Glucose, Bld 116 (*)    All other components within normal limits  CBG MONITORING, ED - Abnormal; Notable for the following components:   Glucose-Capillary 111 (*)    All other components within normal limits  ETHANOL  PROTIME-INR  APTT  CBC  DIFFERENTIAL  RAPID URINE DRUG SCREEN, HOSP PERFORMED  URINALYSIS, ROUTINE W REFLEX MICROSCOPIC  I-STAT TROPONIN, ED    EKG EKG Interpretation  Date/Time:  Monday October 25 2017 21:25:14 EDT Ventricular Rate:  86 PR Interval:    QRS Duration: 90 QT Interval:  399 QTC Calculation: 478 R Axis:   80 Text Interpretation:  Sinus rhythm Probable anteroseptal infarct, old Confirmed by Virgel Manifold [phone]) on 10/25/2017 10:05:50 PM Also confirmed by Virgel Manifold [phone]), editor Hattie Perch ([id number])  on 10/26/2017 7:18:07 AM   Radiology Ct Head Code Stroke Wo Contrast  Result Date: 10/25/2017 CLINICAL DATA:  Code stroke. Right-sided gaze, left-sided neurologic deficits. EXAM: CT HEAD WITHOUT CONTRAST TECHNIQUE: Contiguous axial images were obtained from the base of the skull through the vertex without intravenous contrast. COMPARISON:  Head CT 09/19/2017 FINDINGS: Brain: There is no mass, hemorrhage or extra-axial collection. The size and configuration of the ventricles and extra-axial CSF spaces are normal. There are 2 subcortical old right frontal lobe infarcts, unchanged. There is hypoattenuation of the periventricular white matter, most commonly indicating chronic ischemic microangiopathy. Vascular: No abnormal hyperdensity of the major intracranial arteries or dural venous sinuses. No intracranial atherosclerosis. Skull: The  visualized skull base, calvarium and extracranial soft tissues are normal. Sinuses/Orbits: No fluid levels or advanced  mucosal thickening of the visualized paranasal sinuses. No mastoid or middle ear effusion. The orbits are normal. ASPECTS [facility] Stroke Program Early CT Score) - Ganglionic level infarction (caudate, lentiform nuclei, internal capsule, insula, M1-M3 cortex): 7 - Supraganglionic infarction (M4-M6 cortex): 3 Total score (0-10 with 10 being normal): 10 IMPRESSION: 1. No acute hemorrhage. 2. ASPECTS is 10. 3. Old subcortical infarcts within the right frontal lobe, unchanged compared to 09/19/2017. These results were communicated to Dr. Roland Rack at 9:13 pm on 10/25/2017 by text page via the [facility] messaging system. Electronically Signed   By: Ulyses Jarred M.D.   On: 10/25/2017 21:13    Procedures Procedures (including critical care time)  Medications Ordered in ED Medications  iopamidol (ISOVUE-370) 76 % injection 100 mL (90 mLs Intravenous Contrast Given 10/25/17 2123)     Initial Impression / Assessment and Plan / ED Course  I have reviewed the triage vital signs and the nursing notes.  Pertinent labs & imaging results that were available during my care of the patient were reviewed by me and considered in my medical decision making (see chart for details).     67yM with acute on chronic neuro deficits. Evaluated by neurology. Suspect intrastent stenosis R ICA. Admit for monitoring/further eval.  Final Clinical Impressions(s) / ED Diagnoses   Final diagnoses:  Type 2 diabetes mellitus with complication, without long-term current use of insulin ([place])  TIA (transient ischemic attack)    ED Discharge Orders    None       Virgel Manifold, MD 11/05/17 1039    Virgel Manifold, MD 11/05/17 1039

## 2017-10-25 NOTE — H&P (Signed)
History and Physical    [name] TDD:[id number] DOB: 1950-10-01 DOA: 10/25/2017  Referring MD/NP/PA:   PCP: Charlott Rakes, MD   Patient coming from:  The patient is coming from home.  At baseline, pt is independent for most of ADL.    Chief Complaint: Worsening left-sided weakness, slurred speech, blurry vision  HPI: [name] is a 67 y.o. male with medical history significant of hypertension, hyperlipidemia, diet-controlled diabetes, COPD, stroke with left-sided weakness, GERD, gout, polysubstance abuse (alcohol, tobacco and cocaine), CAD, dCHF, DVT, OSA not on CPAP, PVD, dementia, who presents with worsening left-sided weakness, slurred speech and blurry vision.  Pt has chronic left-sided weakness from previous stroke. His left sided weakness has been waxing/waning in past year, sometimes he is unable to use left arm or leg at all and sometimes it is relatively easy to use. Today, he noted that his left sided weakness has worsened at about 6:30 PM. LKN was unknown. He could not move his left arm and could barely use his left leg.  He states that he also has slurred speech and blurry vision.  No hearing loss.  His symptom has been persistent in ED. he denies any chest pain, cough, shortness breath.  No nausea vomiting, diarrhea or abdominal pain.  No symptoms of UTI.  ED Course: pt was found to have WBC 7.1, INR 0.98, alcohol level less than 10, electrolytes renal function okay, temperature normal, no tachycardia, has tachypnea, oxygen saturation 96% on room air.  Patient is admitted to stepdown as inpatient. CTA of neck and head and CT of the cerebral perfusion showed severely stenotic right ICA with distal perfusion deficit. Neurology, Dr. Leonel Ramsay was consulted.  Review of Systems:   General: no fevers, chills, no body weight gain, has fatigue HEENT: no blurry vision, hearing changes or sore throat Respiratory: no dyspnea, coughing, wheezing CV: no chest pain, no  palpitations GI: no nausea, vomiting, abdominal pain, diarrhea, constipation GU: no dysuria, burning on urination, increased urinary frequency, hematuria  Ext: has minimal leg edema Neuro: Worsening left-sided weakness, slurred speech, blurry vision Skin: no rash, no skin tear. MSK: No muscle spasm, no deformity, no limitation of range of movement in spin Heme: No easy bruising.  Travel history: No recent long distant travel.  Allergy:  Allergies  Allergen Reactions  . Darvocet [Propoxyphene N-Acetaminophen] Hives  . Haldol [Haloperidol Decanoate] Hives  . Acetaminophen Nausea Only    Upset stomach, tolerates Hydrocodone/APAP if taken with food  . Metformin And Related Nausea And Vomiting  . Norco [Hydrocodone-Acetaminophen] Nausea And Vomiting    Tolerates if taken with food    Past Medical History:  Diagnosis Date  . Alcohol abuse    H/O  . Anxiety   . Arthritis   . Asthma   . CAD (coronary artery disease) 05/27/10   Cath: severe single vessell CAD left cx midportion obtuse marginal 2 to 3.  [name] CHF (congestive heart failure) ([place])   . COPD (chronic obstructive pulmonary disease) ([place])   . COPD (chronic obstructive pulmonary disease) ([place])   . Depression   . Diabetes mellitus, type 2 ([place]) 03/13/2015  . GERD (gastroesophageal reflux disease)   . HCAP (healthcare-associated pneumonia) 10/15/2015  . History of DVT of lower extremity   . Hyperlipemia   . Hyperlipidemia   . Hypertension   . Myocardial infarction ([place]) 2000  . Orbital fracture ([place]) 12/2012  . OSA on CPAP   . Peripheral vascular disease, unspecified ([place])  08/20/10 doppler: increase in right ABI post-op. Left ABI stable. S/P bi-fem bypass surgery  . Pneumonia 04/04/2012  . Shortness of breath   . Stroke ([place])   . Tobacco abuse     Past Surgical History:  Procedure Laterality Date  . abdoninal ao angio & bifem angio  05/27/10   Patent graft, occluded bil stents with no retrograde flow into the  hypogastric arteries. 100% occl left ant. tibial artery, 70% to 80% to 100% stenosis right superficial fem artery above adductor canal. 100 % occl right ant. tibial vessell  . Aortogram w/ PTCA  09/292003  . BACK SURGERY    . CARDIAC CATHETERIZATION  05/27/10   severe CAD left cx  . CHOLECYSTECTOMY    . ESOPHAGOGASTRIC FUNDOPLICATION    . EYE SURGERY    . FEMORAL BYPASS  08/19/10   Right Fem-Pop  . IR ANGIO INTRA EXTRACRAN SEL COM CAROTID INNOMINATE BILAT MOD SED  07/20/2016  . IR ANGIO VERTEBRAL SEL SUBCLAVIAN INNOMINATE UNI R MOD SED  07/20/2016  . IR ANGIO VERTEBRAL SEL VERTEBRAL UNI L MOD SED  07/20/2016  . IR RADIOLOGIST EVAL & MGMT  05/27/2016  . JOINT REPLACEMENT    . PERIPHERAL VASCULAR CATHETERIZATION N/A 12/10/2014   Procedure: Abdominal Aortogram;  Surgeon: Angelia Mould, MD;  Location: [facility];  Service: Cardiovascular;  Laterality: N/A;  . RADIOLOGY WITH ANESTHESIA N/A 02/08/2015   Procedure: RADIOLOGY WITH ANESTHESIA;  Surgeon: Luanne Bras, MD;  Location: [facility];  Service: Radiology;  Laterality: N/A;  . TEE WITHOUT CARDIOVERSION N/A 08/29/2014   Procedure: TRANSESOPHAGEAL ECHOCARDIOGRAM (TEE);  Surgeon: Josue Hector, MD;  Location: [facility] ENDOS[facility];  Service: Cardiovascular;  Laterality: N/A;  . TOTAL KNEE ARTHROPLASTY      Social History:  reports that he has been smoking cigars and cigarettes. He started smoking about 56 years ago. He has a 25.00 pack-year smoking history. He has quit using smokeless tobacco.  His smokeless tobacco use included chew. He reports that he drinks alcohol. He reports that he does not use drugs.  Family History:  Family History  Problem Relation Age of Onset  . Heart attack Mother   . Heart failure Mother   . Cirrhosis Father   . Heart failure Brother   . Cancer Brother   . Hypertension Neg Hx        UNKNOWN  . Stroke Neg Hx        UNKNOWN     Prior to Admission medications   Medication Sig Start Date End Date  Taking? Authorizing Provider  ACCU-CHEK SOFTCLIX LANCETS lancets Use 3 times daily before meals 09/17/15   Newlin, Charlane Ferretti, MD  albuterol (PROAIR HFA) 108 (90 Base) MCG/ACT inhaler INHALE 2 PUFFS EVERY 6 HOURS AS NEEDED FOR WHEEZING OR SHORTNESS OF BREATH 10/05/17   Charlott Rakes, MD  albuterol (PROVENTIL) (2.5 MG/3ML) 0.083% nebulizer solution Take 3 mLs (2.5 mg total) by nebulization every 6 (six) hours as needed for wheezing or shortness of breath. 75/12=7 Patient taking differently: Take 2.5 mg by nebulization every 6 (six) hours as needed for wheezing or shortness of breath.  05/20/17   Charlott Rakes, MD  allopurinol (ZYLOPRIM) 300 MG tablet Take 1 tablet (300 mg total) by mouth daily. 08/19/17   Charlott Rakes, MD  atorvastatin (LIPITOR) 80 MG tablet Take 1 tablet (80 mg total) by mouth daily at 6 PM. 08/19/17   Charlott Rakes, MD  Blood Glucose Monitoring Suppl (ACCU-CHEK AVIVA) device Use 3 times daily  before meals. 03/13/15   Charlott Rakes, MD  carvedilol (COREG) 12.5 MG tablet Take 1 tablet (12.5 mg total) by mouth 2 (two) times daily with a meal. 08/19/17   Charlott Rakes, MD  clopidogrel (PLAVIX) 75 MG tablet Take 1 tablet (75 mg total) by mouth daily. 08/19/17   Charlott Rakes, MD  DULoxetine (CYMBALTA) 60 MG capsule Take 1 capsule (60 mg total) by mouth daily. 08/19/17   Charlott Rakes, MD  Fluticasone-Salmeterol (ADVAIR DISKUS) 250-50 MCG/DOSE AEPB Inhale 1 puff into the lungs 2 (two) times daily. 08/19/17   Charlott Rakes, MD  furosemide (LASIX) 20 MG tablet Take 2 tablets (40 mg total) by mouth 2 (two) times daily. 10/08/17   Charlott Rakes, MD  glucose blood (ACCU-CHEK AVIVA) test strip Use 3 times daily before meals as directed. 01/29/16   Charlott Rakes, MD  lactulose (CHRONULAC) 10 GM/15ML solution Take 15 mLs (10 g total) by mouth 2 (two) times daily as needed for mild constipation. 08/19/17   Charlott Rakes, MD  Lancet Devices [facility]) lancets Use 3 times daily  before meals 09/27/15   Charlott Rakes, MD  lisinopril (PRINIVIL,ZESTRIL) 5 MG tablet Take 1 tablet (5 mg total) by mouth daily. 08/19/17   Charlott Rakes, MD  memantine (NAMENDA) 10 MG tablet Take 1 tablet (10 mg total) by mouth 2 (two) times daily. 08/19/17   Charlott Rakes, MD  Misc. Devices (ROLLATOR) MISC 1 each by Does not apply route daily. 08/19/17   Charlott Rakes, MD  pantoprazole (PROTONIX) 40 MG tablet TAKE 1 TABLET BY MOUTH EVERY MORNING 08/17/17   Charlott Rakes, MD  polyethylene glycol (MIRALAX / GLYCOLAX) packet Take 17 g by mouth daily. 07/22/16   Diallo, Earna Coder, MD  pregabalin (LYRICA) 150 MG capsule Take 1 capsule (150 mg total) by mouth 2 (two) times daily. 10/08/17   Charlott Rakes, MD  rOPINIRole (REQUIP) 0.5 MG tablet Take 1 tablet (0.5 mg total) by mouth at bedtime. 08/19/17   Charlott Rakes, MD  Selenium Sulfide 2.25 % SHAM Apply 1 application topically 2 (two) times a week. 01/28/17   Charlott Rakes, MD  tiZANidine (ZANAFLEX) 4 MG tablet Take 1 tablet (4 mg total) by mouth every 8 (eight) hours as needed for muscle spasms. 08/19/17   Charlott Rakes, MD  traZODone (DESYREL) 150 MG tablet Take 1 tablet (150 mg total) by mouth at bedtime as needed for sleep. 08/19/17   Charlott Rakes, MD  triamcinolone cream (KENALOG) 0.1 % Apply 1 application topically daily as needed (for dermatitis). 06/02/16   Charlott Rakes, MD    Physical Exam: Vitals:   10/25/17 2230 10/25/17 2245 10/25/17 2300 10/25/17 2315  BP: 139/78 (!) 137/57 (!) 165/71 (!) 157/75  Pulse: 76 75 80 83  Resp: (!) 25 20 18 20   Temp:      TempSrc:      SpO2: 96% 97% 98% 98%  Weight:      Height:       General: Not in acute distress HEENT:       Eyes: PERRL, EOMI, no scleral icterus.       ENT: No discharge from the ears and nose, no pharynx injection, no tonsillar enlargement.        Neck: No JVD, no bruit, no mass felt. Heme: No neck lymph node enlargement. Cardiac: S1/S2, RRR, No murmurs, No gallops  or rubs. Respiratory: No rales, wheezing, rhonchi or rubs. GI: Soft, nondistended, nontender, no rebound pain, no organomegaly, BS present. GU: No hematuria Ext: has  trace leg edema bilaterally. 1+DP/PT pulse bilaterally. Musculoskeletal: No joint deformities, No joint redness or warmth, no limitation of ROM in spin. Skin: No rashes.  Neuro: Alert, oriented X3, has slurred speech, Cranial nerves II-XII grossly intact. Muscle strength 0/5 in left arm and 1/5 in left leg, 5/5 in right extremities, sensation to light touch intact. Brachial reflex 1+ bilaterally. Negative Babinski's sign. Psych: Patient is not psychotic, no suicidal or hemocidal ideation.  Labs on Admission: I have personally reviewed following labs and imaging studies  CBC: Recent Labs  Lab 10/25/17 2100 10/25/17 2105  WBC 7.1  --   NEUTROABS 4.4  --   HGB 14.0 14.6  HCT 45.0 43.0  MCV 94.5  --   PLT 176  --    Basic Metabolic Panel: Recent Labs  Lab 10/25/17 2100 10/25/17 2105  NA 142 142  K 4.2 4.2  CL 103 102  CO2 29  --   GLUCOSE 120* 116*  BUN 14 15  CREATININE 1.00 0.90  CALCIUM 9.5  --    GFR: Estimated Creatinine Clearance: 94.7 mL/min (by C-G formula based on SCr of 0.9 mg/dL). Liver Function Tests: Recent Labs  Lab 10/25/17 2100  AST 16  ALT 14  ALKPHOS 71  BILITOT 0.4  PROT 6.2*  ALBUMIN 3.7   No results for input(s): LIPASE, AMYLASE in the last 168 hours. No results for input(s): AMMONIA in the last 168 hours. Coagulation Profile: Recent Labs  Lab 10/25/17 2100  INR 0.98   Cardiac Enzymes: No results for input(s): CKTOTAL, CKMB, CKMBINDEX, TROPONINI in the last 168 hours. BNP (last 3 results) No results for input(s): PROBNP in the last 8760 hours. HbA1C: No results for input(s): HGBA1C in the last 72 hours. CBG: Recent Labs  Lab 10/25/17 2058  GLUCAP 111*   Lipid Profile: No results for input(s): CHOL, HDL, LDLCALC, TRIG, CHOLHDL, LDLDIRECT in the last 72  hours. Thyroid Function Tests: No results for input(s): TSH, T4TOTAL, FREET4, T3FREE, THYROIDAB in the last 72 hours. Anemia Panel: No results for input(s): VITAMINB12, FOLATE, FERRITIN, TIBC, IRON, RETICCTPCT in the last 72 hours. Urine analysis:    Component Value Date/Time   COLORURINE STRAW (A) 09/19/2017 2132   APPEARANCEUR CLEAR 09/19/2017 2132   LABSPEC 1.003 (L) 09/19/2017 2132   PHURINE 5.0 09/19/2017 2132   GLUCOSEU NEGATIVE 09/19/2017 2132   HGBUR SMALL (A) 09/19/2017 2132   BILIRUBINUR NEGATIVE 09/19/2017 2132   KETONESUR NEGATIVE 09/19/2017 2132   PROTEINUR NEGATIVE 09/19/2017 2132   UROBILINOGEN 0.2 08/19/2014 2103   NITRITE NEGATIVE 09/19/2017 2132   LEUKOCYTESUR NEGATIVE 09/19/2017 2132   Sepsis Labs: @LABRCNTIP (procalcitonin:4,lacticidven:4) )No results found for this or any previous visit (from the past 240 hour(s)).   Radiological Exams on Admission: Ct Angio Head W Or Wo Contrast  Result Date: 10/25/2017 CLINICAL DATA:  Initial evaluation for acute left-sided deficits, right-sided gaze. EXAM: CT ANGIOGRAPHY HEAD AND NECK CT PERFUSION BRAIN TECHNIQUE: Multidetector CT imaging of the head and neck was performed using the standard protocol during bolus administration of intravenous contrast. Multiplanar CT image reconstructions and MIPs were obtained to evaluate the vascular anatomy. Carotid stenosis measurements (when applicable) are obtained utilizing NASCET criteria, using the distal internal carotid diameter as the denominator. Multiphase CT imaging of the brain was performed following IV bolus contrast injection. Subsequent parametric perfusion maps were calculated using RAPID software. CONTRAST:  5mL ISOVUE-370 IOPAMIDOL (ISOVUE-370) INJECTION 76% COMPARISON:  Prior noncontrast head CT from earlier the same day as well as prior  CTA from 07/18/2016 and arteriogram from 07/20/2016. FINDINGS: CTA NECK FINDINGS Aortic arch: Visualized aortic arch of normal caliber with  normal branch pattern. Moderate atherosclerotic change about the visualized arch and origin of the great vessels with ulcerated soft plaque within the distal arch. Approximate 50% stenosis at the proximal right brachiocephalic artery just distal to its origin, similar to previous. Visualized subclavian arteries patent. Right carotid system: Right common carotid artery patent to the level of the distal right CCA stent. Stent appears patent proximally, but nearly entirely occluded at its mid and distal aspect (series 5, image 70). Irregular attenuated flow is seen just distal to the stent, with subsequent occlusion (series 5, image 87). Right ICA otherwise occluded within the neck. Patent flow within the right external carotid artery and its branches. Left carotid system: Left common carotid artery patent from its origin to the bifurcation. Scattered multifocal plaque about the left bifurcation without hemodynamically significant stenosis. Left ICA patent distally to the skull base without stenosis, dissection, or occlusion. Incidental note made of left external carotid artery occlusion at its origin, with distal reconstitution via collateralization. Vertebral arteries: Both of the vertebral arteries arise from the subclavian arteries. Left vertebral artery dominant. Focal plaque at the origin of the vertebral arteries bilaterally with approximate moderate proximal right V1 stenosis. More mild stenosis at the origin of the left vertebral artery. Scattered multifocal plaque within the V2 and V3 segments bilaterally without high-grade stenosis. Vertebral arteries are patent to the skull base. Skeleton: No acute osseus abnormality. No discrete lytic or blastic osseous lesions. Patient is edentulous. Mild-to-moderate multilevel cervical spondylolysis, stable. Other neck: No other acute soft tissue abnormality within the neck. Normal thyroid. No adenopathy. Upper chest: Visualized upper chest demonstrates no acute finding.  Partially visualized lungs are clear. Review of the MIP images confirms the above findings CTA HEAD FINDINGS Anterior circulation: Right ICA occluded at the skull base. Distal reconstitution at the supraclinoid segment via flow across the circle-of-Willis. Cavernous to supraclinoid right ICA stent grossly patent distally, not well assessed proximally. Petrous left ICA widely patent. Scattered multifocal plaque within the cavernous left ICA with approximate moderate stenosis, grossly similar to previous. ICA termini patent. A1 segments patent bilaterally. Right A1 hypoplastic. Normal anterior communicating artery. Anterior cerebral arteries patent to their distal aspects without stenosis. Right A1 slightly hypoplastic. Right M1 widely patent. Normal right MCA bifurcation. No proximal right M2 occlusion. Distal right MCA branches well perfused. Left M1 widely patent. Normal left MCA bifurcation. No proximal left M2 occlusion. Distal left MCA branches well perfused. Posterior circulation: Vertebral arteries patent to the vertebrobasilar junction without stenosis. Left PICA patent. Right PICA not well seen. Basilar artery widely patent to its distal aspect. Superior cerebral arteries patent bilaterally. Both of the posterior cerebral arteries primarily supplied via the basilar and are widely patent to their distal aspects without stenosis. Venous sinuses: Grossly patent. Anatomic variants: None significant. Delayed phase: Not performed. Review of the MIP images confirms the above findings CT Brain Perfusion Findings: CBF (<30%) Volume: 41mL Perfusion (Tmax>6.0s) volume: 27mL Mismatch Volume: 241mL Infarction Location:Scattered watershed type infarcts seen involving the right MCA/ACA territory as well as the MCA/PCA territory as well as the deep watershed cerebral white matter. Large surrounding ischemic penumbra involving the entirety of the right MCA territory related to delayed perfusion due to the occluded right ICA  stent. IMPRESSION: 1. Occluded/nearly occluded proximal right ICA stent, with little to absent flow distally within the neck. Distal reconstitution at the right ICA  terminus with grossly patent flow within the distal cavernous/supraclinoid right ICA stent. Right MCA is perfused without proximal or large vessel occlusion. 2. Acute watershed type infarcts involving the right MCA/ACA and MCA/PCA territories as well as the deep cerebral white matter. Large surrounding penumbra throughout the right MCA distribution due to delayed perfusion from the occluded right ICA stent. 3. Grossly patent distal cavernous/supraclinoid right ICA stent. Evaluation for possible intra stent stenosis difficult given proximal occlusion. 4. Atheromatous plaque within the cavernous left ICA with approximate 50% stenosis, similar to previous. 5. Short-segment atheromatous stenosis of approximately 50% at the proximal innominate artery. 6. Moderate atheromatous stenoses of up to approximate 50% involving the proximal right V1 segment, with mild stenosis at the origin of the left vertebral artery. Otherwise patent vertebrobasilar system. 7. 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 These results were communicated to Dr. Leonel Ramsay at 9:57 pmon 9/16/2019by text page via the [facility] messaging system. Electronically Signed   By: Jeannine Boga M.D.   On: 10/25/2017 22:10   Ct Angio Neck W Or Wo Contrast  Result Date: 10/25/2017 CLINICAL DATA:  Initial evaluation for acute left-sided deficits, right-sided gaze. EXAM: CT ANGIOGRAPHY HEAD AND NECK CT PERFUSION BRAIN TECHNIQUE: Multidetector CT imaging of the head and neck was performed using the standard protocol during bolus administration of intravenous contrast. Multiplanar CT image reconstructions and MIPs were obtained to evaluate the vascular anatomy. Carotid stenosis measurements (when applicable) are obtained utilizing NASCET  criteria, using the distal internal carotid diameter as the denominator. Multiphase CT imaging of the brain was performed following IV bolus contrast injection. Subsequent parametric perfusion maps were calculated using RAPID software. CONTRAST:  72mL ISOVUE-370 IOPAMIDOL (ISOVUE-370) INJECTION 76% COMPARISON:  Prior noncontrast head CT from earlier the same day as well as prior CTA from 07/18/2016 and arteriogram from 07/20/2016. FINDINGS: CTA NECK FINDINGS Aortic arch: Visualized aortic arch of normal caliber with normal branch pattern. Moderate atherosclerotic change about the visualized arch and origin of the great vessels with ulcerated soft plaque within the distal arch. Approximate 50% stenosis at the proximal right brachiocephalic artery just distal to its origin, similar to previous. Visualized subclavian arteries patent. Right carotid system: Right common carotid artery patent to the level of the distal right CCA stent. Stent appears patent proximally, but nearly entirely occluded at its mid and distal aspect (series 5, image 70). Irregular attenuated flow is seen just distal to the stent, with subsequent occlusion (series 5, image 87). Right ICA otherwise occluded within the neck. Patent flow within the right external carotid artery and its branches. Left carotid system: Left common carotid artery patent from its origin to the bifurcation. Scattered multifocal plaque about the left bifurcation without hemodynamically significant stenosis. Left ICA patent distally to the skull base without stenosis, dissection, or occlusion. Incidental note made of left external carotid artery occlusion at its origin, with distal reconstitution via collateralization. Vertebral arteries: Both of the vertebral arteries arise from the subclavian arteries. Left vertebral artery dominant. Focal plaque at the origin of the vertebral arteries bilaterally with approximate moderate proximal right V1 stenosis. More mild stenosis at  the origin of the left vertebral artery. Scattered multifocal plaque within the V2 and V3 segments bilaterally without high-grade stenosis. Vertebral arteries are patent to the skull base. Skeleton: No acute osseus abnormality. No discrete lytic or blastic osseous lesions. Patient is edentulous. Mild-to-moderate multilevel cervical spondylolysis, stable. Other neck: No other acute soft tissue abnormality within the  neck. Normal thyroid. No adenopathy. Upper chest: Visualized upper chest demonstrates no acute finding. Partially visualized lungs are clear. Review of the MIP images confirms the above findings CTA HEAD FINDINGS Anterior circulation: Right ICA occluded at the skull base. Distal reconstitution at the supraclinoid segment via flow across the circle-of-Willis. Cavernous to supraclinoid right ICA stent grossly patent distally, not well assessed proximally. Petrous left ICA widely patent. Scattered multifocal plaque within the cavernous left ICA with approximate moderate stenosis, grossly similar to previous. ICA termini patent. A1 segments patent bilaterally. Right A1 hypoplastic. Normal anterior communicating artery. Anterior cerebral arteries patent to their distal aspects without stenosis. Right A1 slightly hypoplastic. Right M1 widely patent. Normal right MCA bifurcation. No proximal right M2 occlusion. Distal right MCA branches well perfused. Left M1 widely patent. Normal left MCA bifurcation. No proximal left M2 occlusion. Distal left MCA branches well perfused. Posterior circulation: Vertebral arteries patent to the vertebrobasilar junction without stenosis. Left PICA patent. Right PICA not well seen. Basilar artery widely patent to its distal aspect. Superior cerebral arteries patent bilaterally. Both of the posterior cerebral arteries primarily supplied via the basilar and are widely patent to their distal aspects without stenosis. Venous sinuses: Grossly patent. Anatomic variants: None significant.  Delayed phase: Not performed. Review of the MIP images confirms the above findings CT Brain Perfusion Findings: CBF (<30%) Volume: 85mL Perfusion (Tmax>6.0s) volume: 219mL Mismatch Volume: 220mL Infarction Location:Scattered watershed type infarcts seen involving the right MCA/ACA territory as well as the MCA/PCA territory as well as the deep watershed cerebral white matter. Large surrounding ischemic penumbra involving the entirety of the right MCA territory related to delayed perfusion due to the occluded right ICA stent. IMPRESSION: 1. Occluded/nearly occluded proximal right ICA stent, with little to absent flow distally within the neck. Distal reconstitution at the right ICA terminus with grossly patent flow within the distal cavernous/supraclinoid right ICA stent. Right MCA is perfused without proximal or large vessel occlusion. 2. Acute watershed type infarcts involving the right MCA/ACA and MCA/PCA territories as well as the deep cerebral white matter. Large surrounding penumbra throughout the right MCA distribution due to delayed perfusion from the occluded right ICA stent. 3. Grossly patent distal cavernous/supraclinoid right ICA stent. Evaluation for possible intra stent stenosis difficult given proximal occlusion. 4. Atheromatous plaque within the cavernous left ICA with approximate 50% stenosis, similar to previous. 5. Short-segment atheromatous stenosis of approximately 50% at the proximal innominate artery. 6. Moderate atheromatous stenoses of up to approximate 50% involving the proximal right V1 segment, with mild stenosis at the origin of the left vertebral artery. Otherwise patent vertebrobasilar system. 7. 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 These results were communicated to Dr. Leonel Ramsay at 9:57 pmon 9/16/2019by text page via the [facility] messaging system. Electronically Signed   By: Jeannine Boga M.D.   On: 10/25/2017 22:10    Ct Cerebral Perfusion W Contrast  Result Date: 10/25/2017 CLINICAL DATA:  Initial evaluation for acute left-sided deficits, right-sided gaze. EXAM: CT ANGIOGRAPHY HEAD AND NECK CT PERFUSION BRAIN TECHNIQUE: Multidetector CT imaging of the head and neck was performed using the standard protocol during bolus administration of intravenous contrast. Multiplanar CT image reconstructions and MIPs were obtained to evaluate the vascular anatomy. Carotid stenosis measurements (when applicable) are obtained utilizing NASCET criteria, using the distal internal carotid diameter as the denominator. Multiphase CT imaging of the brain was performed following IV bolus contrast injection. Subsequent parametric perfusion maps were calculated using  RAPID software. CONTRAST:  57mL ISOVUE-370 IOPAMIDOL (ISOVUE-370) INJECTION 76% COMPARISON:  Prior noncontrast head CT from earlier the same day as well as prior CTA from 07/18/2016 and arteriogram from 07/20/2016. FINDINGS: CTA NECK FINDINGS Aortic arch: Visualized aortic arch of normal caliber with normal branch pattern. Moderate atherosclerotic change about the visualized arch and origin of the great vessels with ulcerated soft plaque within the distal arch. Approximate 50% stenosis at the proximal right brachiocephalic artery just distal to its origin, similar to previous. Visualized subclavian arteries patent. Right carotid system: Right common carotid artery patent to the level of the distal right CCA stent. Stent appears patent proximally, but nearly entirely occluded at its mid and distal aspect (series 5, image 70). Irregular attenuated flow is seen just distal to the stent, with subsequent occlusion (series 5, image 87). Right ICA otherwise occluded within the neck. Patent flow within the right external carotid artery and its branches. Left carotid system: Left common carotid artery patent from its origin to the bifurcation. Scattered multifocal plaque about the left  bifurcation without hemodynamically significant stenosis. Left ICA patent distally to the skull base without stenosis, dissection, or occlusion. Incidental note made of left external carotid artery occlusion at its origin, with distal reconstitution via collateralization. Vertebral arteries: Both of the vertebral arteries arise from the subclavian arteries. Left vertebral artery dominant. Focal plaque at the origin of the vertebral arteries bilaterally with approximate moderate proximal right V1 stenosis. More mild stenosis at the origin of the left vertebral artery. Scattered multifocal plaque within the V2 and V3 segments bilaterally without high-grade stenosis. Vertebral arteries are patent to the skull base. Skeleton: No acute osseus abnormality. No discrete lytic or blastic osseous lesions. Patient is edentulous. Mild-to-moderate multilevel cervical spondylolysis, stable. Other neck: No other acute soft tissue abnormality within the neck. Normal thyroid. No adenopathy. Upper chest: Visualized upper chest demonstrates no acute finding. Partially visualized lungs are clear. Review of the MIP images confirms the above findings CTA HEAD FINDINGS Anterior circulation: Right ICA occluded at the skull base. Distal reconstitution at the supraclinoid segment via flow across the circle-of-Willis. Cavernous to supraclinoid right ICA stent grossly patent distally, not well assessed proximally. Petrous left ICA widely patent. Scattered multifocal plaque within the cavernous left ICA with approximate moderate stenosis, grossly similar to previous. ICA termini patent. A1 segments patent bilaterally. Right A1 hypoplastic. Normal anterior communicating artery. Anterior cerebral arteries patent to their distal aspects without stenosis. Right A1 slightly hypoplastic. Right M1 widely patent. Normal right MCA bifurcation. No proximal right M2 occlusion. Distal right MCA branches well perfused. Left M1 widely patent. Normal left MCA  bifurcation. No proximal left M2 occlusion. Distal left MCA branches well perfused. Posterior circulation: Vertebral arteries patent to the vertebrobasilar junction without stenosis. Left PICA patent. Right PICA not well seen. Basilar artery widely patent to its distal aspect. Superior cerebral arteries patent bilaterally. Both of the posterior cerebral arteries primarily supplied via the basilar and are widely patent to their distal aspects without stenosis. Venous sinuses: Grossly patent. Anatomic variants: None significant. Delayed phase: Not performed. Review of the MIP images confirms the above findings CT Brain Perfusion Findings: CBF (<30%) Volume: 47mL Perfusion (Tmax>6.0s) volume: 288mL Mismatch Volume: 241mL Infarction Location:Scattered watershed type infarcts seen involving the right MCA/ACA territory as well as the MCA/PCA territory as well as the deep watershed cerebral white matter. Large surrounding ischemic penumbra involving the entirety of the right MCA territory related to delayed perfusion due to the occluded right ICA  stent. IMPRESSION: 1. Occluded/nearly occluded proximal right ICA stent, with little to absent flow distally within the neck. Distal reconstitution at the right ICA terminus with grossly patent flow within the distal cavernous/supraclinoid right ICA stent. Right MCA is perfused without proximal or large vessel occlusion. 2. Acute watershed type infarcts involving the right MCA/ACA and MCA/PCA territories as well as the deep cerebral white matter. Large surrounding penumbra throughout the right MCA distribution due to delayed perfusion from the occluded right ICA stent. 3. Grossly patent distal cavernous/supraclinoid right ICA stent. Evaluation for possible intra stent stenosis difficult given proximal occlusion. 4. Atheromatous plaque within the cavernous left ICA with approximate 50% stenosis, similar to previous. 5. Short-segment atheromatous stenosis of approximately 50% at the  proximal innominate artery. 6. Moderate atheromatous stenoses of up to approximate 50% involving the proximal right V1 segment, with mild stenosis at the origin of the left vertebral artery. Otherwise patent vertebrobasilar system. 7. 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 These results were communicated to Dr. Leonel Ramsay at 9:57 pmon 9/16/2019by text page via the [facility] messaging system. Electronically Signed   By: Jeannine Boga M.D.   On: 10/25/2017 22:10   Ct Head Code Stroke Wo Contrast  Result Date: 10/25/2017 CLINICAL DATA:  Code stroke. Right-sided gaze, left-sided neurologic deficits. EXAM: CT HEAD WITHOUT CONTRAST TECHNIQUE: Contiguous axial images were obtained from the base of the skull through the vertex without intravenous contrast. COMPARISON:  Head CT 09/19/2017 FINDINGS: Brain: There is no mass, hemorrhage or extra-axial collection. The size and configuration of the ventricles and extra-axial CSF spaces are normal. There are 2 subcortical old right frontal lobe infarcts, unchanged. There is hypoattenuation of the periventricular white matter, most commonly indicating chronic ischemic microangiopathy. Vascular: No abnormal hyperdensity of the major intracranial arteries or dural venous sinuses. No intracranial atherosclerosis. Skull: The visualized skull base, calvarium and extracranial soft tissues are normal. Sinuses/Orbits: No fluid levels or advanced mucosal thickening of the visualized paranasal sinuses. No mastoid or middle ear effusion. The orbits are normal. ASPECTS [facility] Stroke Program Early CT Score) - Ganglionic level infarction (caudate, lentiform nuclei, internal capsule, insula, M1-M3 cortex): 7 - Supraganglionic infarction (M4-M6 cortex): 3 Total score (0-10 with 10 being normal): 10 IMPRESSION: 1. No acute hemorrhage. 2. ASPECTS is 10. 3. Old subcortical infarcts within the right frontal lobe, unchanged compared  to 09/19/2017. These results were communicated to Dr. Roland Rack at 9:13 pm on 10/25/2017 by text page via the [facility] messaging system. Electronically Signed   By: Ulyses Jarred M.D.   On: 10/25/2017 21:13     EKG: Independently reviewed.  Sinus rhythm, QTC 478, low voltage, anteroseptal infarction pattern   Assessment/Plan Principal Problem:   CVA (cerebral vascular accident) ([place]) Active Problems:   COPD (chronic obstructive pulmonary disease) (HCC)   GERD (gastroesophageal reflux disease)   Gout   Chronic diastolic congestive heart failure (HCC)   CAD (coronary artery disease)   Alcohol abuse   Stroke [facility])   Essential hypertension   HLD (hyperlipidemia)   Tobacco use disorder   Dementia due to another medical condition   Depression   CVA (cerebral vascular accident) [facility]): Patient symptoms are concerning for new stroke.  CTA of neck and head and CT of the cerebral perfusion showed severely stenotic right ICA with distal perfusion deficit. Neurology, Dr. Leonel Ramsay was consulted. He recommended stroke work-up. He Leonel Ramsay also recommended stepdown status for frequent neuro checks and adequate hydration. Since pt  has hx of dCHF--will do gentle hydration.  -will admit to SDU as inpt. -highly appreciate Dr. Cecil Cobbs consultation, and will f/u recommendations as follows: Recommendations :  - HgbA1c, fasting lipid panel  - MRI of the brain without contrast  - Frequent neuro checks  - Echocardiogram  - Carotid dopplers  - Prophylactic therapy-Antiplatelet med: Aspirin - dose 325mg  PO or 300mg  PR and plavix 75mg  daily  - Risk factor modification  - Telemetry monitoring  - PT consult, OT consult, Speech consult  - IV fluids  - Stroke team to follow -will consult SW and CM for possible rehab placement -IVF: NS 75 cc/h -hold blood pressure medications to allow permissive hypertension for BSP>220  COPD (chronic obstructive pulmonary disease) ([place]):  stable. -prn albuterol nebs  GERD (gastroesophageal reflux disease): -protonix  Gout: -Allopurinol  Chronic diastolic congestive heart failure ([place]): 2D echo on 07/18/2016 showed EF of 50-55% with grade 1 diastolic dysfunction.  Patient has a trace amount of leg edema, but no JVD.  No respiratory distress.  CHF seems to be compensated per -Hold Lasix since patient needs adequate brain perfusion with IV fluid - Check BMP : no CP  CAD (coronary artery disease) -ASA, lipitor  Tobacco abuse and Alcohol abuse: -Nicotine patch -CIWA protocol  Essential hypertension: -hold Coreg, lisinopril and Lasix to allow permissive hypertension  -IV hydralazine PRN for SBP>220.  HLD (hyperlipidemia): -Lipitor  Dementia due to another medical condition: No behavior change. -Continue Namenda  Depression: -Continue Cymbalta, Requip   Inpatient status:  # Patient requires inpatient status due to high intensity of service, high risk for further deterioration and high frequency of surveillance required.  I certify that at the point of admission it is my clinical judgment that the patient will require inpatient hospital care spanning beyond 2 midnights from the point of admission.  . This patient has multiple chronic comorbidities including hypertension, hyperlipidemia, diet-controlled diabetes, COPD, stroke with left-sided weakness, GERD, gout, polysubstance abuse (alcohol, tobacco and cocaine), CAD, dCHF, DVT, OSA not on CPAP, PVD, dementia. . Now patient has presenting symptoms include worsening left-sided weakness, slurred speech and blurry vision . The worrisome physical exam findings include severe left sided weakness, slurred speech. Pt may need SNF or rehab placement. . The initial radiographic and laboratory data are worrisome because CTA of neck and head and CT of the cerebral perfusion showed severely stenotic right ICA with distal perfusion deficit. . Current medical needs: please see my  assessment and plan   DVT ppx: SQ Lovenox Code Status: DNRI discussed with patient about code status, explained the meaning of CODE STATUS, he wants to be DNR.  I also explained the consequences of DNR. He completely understands the consequences, including death. He is very clear that he wants to be DNR).  Family Communication: None at bed side.    Disposition Plan:  Anticipate discharge back to previous home environment Consults called:  Dr. Leonel Ramsay of neurology Admission status:  SDU/inpation       Date of Service 10/25/2017    [facility] Pager [phone]  If 7PM-7AM, please contact night-coverage [email or web address] Password [facility] 10/25/2017, 11:37 PM

## 2017-10-25 NOTE — Consult Note (Signed)
Neurology Consultation Reason for Consult: Stroke Referring Physician: Wilson Singer, S  CC: Left sided weakness  History is obtained from:patient  HPI: [name] is a 67 y.o. male with a history of previous strokes resulting in left sided weakness. He  Had a hoem health aide earlier in the day and she departed around 11am. Around 6:30 pm, he went down to smoke, and another lady who was also out to smoke stated that he did not look so good and he agreed that he might need to dcome to the hospital. He ins unable to give a LKW but just states that it could have been going on earlier in the day.   Of note, he says that for the past year or so he has had a waxing/waning weakness of the left side, sometimes unable to use it at all and sometimes it is relatively easy to use.  He does not feel that tonight was anything significantly different.  At baseline, he is not really able to walk, he is able to take a step or 2, but primarily uses a wheelchair.  LKW: unclear tpa given?: no, out of window.  Premorbid modified rankin scale: 4   ROS: A 14 point ROS was performed and is negative except as noted in the HPI.   Past Medical History:  Diagnosis Date  . Alcohol abuse    H/O  . Anxiety   . Arthritis   . Asthma   . CAD (coronary artery disease) 05/27/10   Cath: severe single vessell CAD left cx midportion obtuse marginal 2 to 3.  [name] CHF (congestive heart failure) ([place])   . COPD (chronic obstructive pulmonary disease) ([place])   . COPD (chronic obstructive pulmonary disease) ([place])   . Depression   . Diabetes mellitus, type 2 ([place]) 03/13/2015  . GERD (gastroesophageal reflux disease)   . HCAP (healthcare-associated pneumonia) 10/15/2015  . History of DVT of lower extremity   . Hyperlipemia   . Hyperlipidemia   . Hypertension   . Myocardial infarction ([place]) 2000  . Orbital fracture ([place]) 12/2012  . OSA on CPAP   . Peripheral vascular disease, unspecified ([place])    08/20/10 doppler: increase in  right ABI post-op. Left ABI stable. S/P bi-fem bypass surgery  . Pneumonia 04/04/2012  . Shortness of breath   . Stroke ([place])   . Tobacco abuse      Family History  Problem Relation Age of Onset  . Heart attack Mother   . Heart failure Mother   . Cirrhosis Father   . Heart failure Brother   . Cancer Brother   . Hypertension Neg Hx        UNKNOWN  . Stroke Neg Hx        UNKNOWN     Social History:  reports that he has been smoking cigars and cigarettes. He started smoking about 56 years ago. He has a 25.00 pack-year smoking history. He has quit using smokeless tobacco.  His smokeless tobacco use included chew. He reports that he drinks alcohol. He reports that he does not use drugs.   Exam: Current vital signs: Vitals:   10/25/17 2200 10/25/17 2215  BP: 134/64 125/83  Pulse: 81 77  Resp: 17 (!) 27  Temp:    SpO2: 96% 96%    Vital signs in last 24 hours:     Physical Exam  Constitutional: Appears well-developed and well-nourished.  Psych: Affect appropriate to situation Eyes: No scleral injection HENT: No OP obstrucion Head: Normocephalic.  Cardiovascular: Normal rate and regular rhythm.  Respiratory: Effort normal, non-labored breathing GI: Soft.  No distension. There is no tenderness.  Skin: WDI  Neuro: Mental Status: Patient is awake, alert, oriented to person, place, month, year, and situation. Patient is able to give a clear and coherent history. Initially has a pretty significant left neglect, however with some IV fluids he was no longer extinguishing to simultaneous stimulation. Cranial Nerves: II: Initially he had a left hemianopia, however on subsequent exams this had resolved.  Pupils are equal, round, and reactive to light.   III,IV, VI: Right gaze preference V: Facial sensation is symmetric to temperature VII: Facial movement is symmetric.  VIII: hearing is intact to voice X: Uvula elevates symmetrically XI: Shoulder shrug is symmetric. XII:  tongue is midline without atrophy or fasciculations.  Motor: Tone is normal. Bulk is normal. 5/5 strength was present on the right side, he has 4/5 strength of the left leg, spastic 2/5 strength left arm Sensory: Sensation is diminished on the left compared to the right. Cerebellar: No clear ataxia on the right, unable to perform on the left  I have reviewed labs in epic and the results pertinent to this consultation are: Creatinine 1.0  I have reviewed the images obtained: CT A/P- severely stenotic right ICA with distal perfusion deficit  Impression: 67 year old male with likely acute on chronic IntraStent stenosis of the right ICA.  There does appear to be some flow immediately distal to the stent, however it is not adequate to maintain antegrade flow.  With a modified Rankin of 4 and an open MCA, I do not feel that he would be a good intervention candidate.  I would favor keeping him on stepdown status for frequent neuro checks and adequate hydration. Even with a BP in the 120s, he had no hemianopia or extinction.   Recommendations: - HgbA1c, fasting lipid panel - MRI of the brain without contrast - Frequent neuro checks - Echocardiogram - Carotid dopplers - Prophylactic therapy-Antiplatelet med: Aspirin - dose 325mg  PO or 300mg  PR and plavix 75mg  daily - Risk factor modification - Telemetry monitoring - PT consult, OT consult, Speech consult - IV fluids - Stroke team to follow   This patient is critically ill and at significant risk of neurological worsening, death and care requires constant monitoring of vital signs, hemodynamics,respiratory and cardiac monitoring, neurological assessment, discussion with family, other specialists and medical decision making of high complexity. I spent 50 minutes of neurocritical care time  in the care of  this patient.  Roland Rack, MD [facility] [phone]  If 7pm- 7am, please page neurology on call as listed in  [place]. 10/25/2017  10:24 PM

## 2017-10-26 ENCOUNTER — Inpatient Hospital Stay (HOSPITAL_COMMUNITY): Payer: Medicare Other

## 2017-10-26 ENCOUNTER — Other Ambulatory Visit (HOSPITAL_COMMUNITY): Payer: Self-pay

## 2017-10-26 ENCOUNTER — Encounter (HOSPITAL_COMMUNITY): Payer: Self-pay | Admitting: Emergency Medicine

## 2017-10-26 DIAGNOSIS — I63031 Cerebral infarction due to thrombosis of right carotid artery: Secondary | ICD-10-CM

## 2017-10-26 LAB — URINALYSIS, ROUTINE W REFLEX MICROSCOPIC
Bilirubin Urine: NEGATIVE
GLUCOSE, UA: NEGATIVE mg/dL
HGB URINE DIPSTICK: NEGATIVE
Ketones, ur: NEGATIVE mg/dL
Leukocytes, UA: NEGATIVE
Nitrite: NEGATIVE
PH: 7 (ref 5.0–8.0)
Protein, ur: NEGATIVE mg/dL

## 2017-10-26 LAB — RAPID URINE DRUG SCREEN, HOSP PERFORMED
Amphetamines: NOT DETECTED
BARBITURATES: NOT DETECTED
Benzodiazepines: NOT DETECTED
COCAINE: POSITIVE — AB
Opiates: NOT DETECTED
TETRAHYDROCANNABINOL: NOT DETECTED

## 2017-10-26 LAB — LIPID PANEL
CHOL/HDL RATIO: 3.7 ratio
Cholesterol: 161 mg/dL (ref 0–200)
HDL: 43 mg/dL (ref >40)
LDL CALC: 97 mg/dL (ref 0–99)
Triglycerides: 103 mg/dL (ref <150)
VLDL: 21 mg/dL (ref 0–40)

## 2017-10-26 LAB — HIV ANTIBODY (ROUTINE TESTING W REFLEX): HIV SCREEN 4TH GENERATION: NONREACTIVE

## 2017-10-26 LAB — BRAIN NATRIURETIC PEPTIDE: B NATRIURETIC PEPTIDE 5: 35.6 pg/mL (ref 0.0–100.0)

## 2017-10-26 MED ORDER — ATORVASTATIN CALCIUM 80 MG PO TABS
80.0000 mg | ORAL_TABLET | Freq: Every day | ORAL | Status: DC
  Administered 2017-10-27 – 2017-10-29 (×3): 80 mg via ORAL
  Filled 2017-10-26 (×4): qty 1

## 2017-10-26 MED ORDER — SELENIUM SULFIDE 1 % EX LOTN: 1.0000 "application " | TOPICAL_LOTION | CUTANEOUS | Status: DC | PRN

## 2017-10-26 MED ORDER — MEMANTINE HCL 10 MG PO TABS
10.0000 mg | ORAL_TABLET | Freq: Two times a day (BID) | ORAL | Status: DC
  Administered 2017-10-27 – 2017-10-29 (×5): 10 mg via ORAL
  Filled 2017-10-26 (×9): qty 1

## 2017-10-26 MED ORDER — ALLOPURINOL 100 MG PO TABS
300.0000 mg | ORAL_TABLET | Freq: Every day | ORAL | Status: DC
  Administered 2017-10-27 – 2017-10-29 (×3): 300 mg via ORAL
  Filled 2017-10-26 (×3): qty 3
  Filled 2017-10-26: qty 1

## 2017-10-26 MED ORDER — CLOPIDOGREL BISULFATE 75 MG PO TABS
75.0000 mg | ORAL_TABLET | Freq: Every day | ORAL | Status: DC
  Administered 2017-10-27 – 2017-10-29 (×3): 75 mg via ORAL
  Filled 2017-10-26 (×3): qty 1

## 2017-10-26 MED ORDER — LACTULOSE 10 GM/15ML PO SOLN
10.0000 g | Freq: Two times a day (BID) | ORAL | Status: DC | PRN
  Filled 2017-10-26: qty 15

## 2017-10-26 MED ORDER — PANTOPRAZOLE SODIUM 40 MG PO TBEC
40.0000 mg | DELAYED_RELEASE_TABLET | Freq: Every morning | ORAL | Status: DC
  Administered 2017-10-27 – 2017-10-29 (×3): 40 mg via ORAL
  Filled 2017-10-26 (×4): qty 1

## 2017-10-26 MED ORDER — TRIAMCINOLONE ACETONIDE 0.1 % EX CREA: 1.0000 "application " | TOPICAL_CREAM | Freq: Every day | CUTANEOUS | Status: DC | PRN

## 2017-10-26 MED ORDER — POLYETHYLENE GLYCOL 3350 17 G PO PACK
17.0000 g | PACK | Freq: Every day | ORAL | Status: DC
  Administered 2017-10-28 – 2017-10-29 (×2): 17 g via ORAL
  Filled 2017-10-26 (×3): qty 1

## 2017-10-26 MED ORDER — PREGABALIN 75 MG PO CAPS
150.0000 mg | ORAL_CAPSULE | Freq: Two times a day (BID) | ORAL | Status: DC
  Administered 2017-10-27 – 2017-10-29 (×5): 150 mg via ORAL
  Filled 2017-10-26 (×5): qty 2

## 2017-10-26 MED ORDER — TIZANIDINE HCL 4 MG PO TABS: 4.0000 mg | ORAL_TABLET | Freq: Three times a day (TID) | ORAL | Status: DC | PRN

## 2017-10-26 MED ORDER — TRAZODONE HCL 50 MG PO TABS
150.0000 mg | ORAL_TABLET | Freq: Every evening | ORAL | Status: DC | PRN
  Administered 2017-10-28: 150 mg via ORAL
  Filled 2017-10-26: qty 1

## 2017-10-26 MED ORDER — ROPINIROLE HCL 1 MG PO TABS
0.5000 mg | ORAL_TABLET | Freq: Every day | ORAL | Status: DC
  Administered 2017-10-27 – 2017-10-28 (×2): 0.5 mg via ORAL
  Filled 2017-10-26 (×2): qty 1

## 2017-10-26 MED ORDER — DULOXETINE HCL 60 MG PO CPEP
60.0000 mg | ORAL_CAPSULE | Freq: Every day | ORAL | Status: DC
  Administered 2017-10-27 – 2017-10-29 (×3): 60 mg via ORAL
  Filled 2017-10-26 (×4): qty 1

## 2017-10-26 MED ORDER — MOMETASONE FURO-FORMOTEROL FUM 200-5 MCG/ACT IN AERO
2.0000 | INHALATION_SPRAY | Freq: Two times a day (BID) | RESPIRATORY_TRACT | Status: DC
  Administered 2017-10-26 – 2017-10-29 (×7): 2 via RESPIRATORY_TRACT
  Filled 2017-10-26: qty 8.8

## 2017-10-26 NOTE — Progress Notes (Addendum)
STROKE TEAM PROGRESS NOTE  HPI: ( Dr Leonel Ramsay ) [name] is a 67 y.o. male with a history of previous strokes resulting in left sided weakness. He  Had a [facility]em health aide earlier in the day and she departed around 11am. Around 6:30 pm, he went down to smoke, and another lady who was also out to smoke stated that he did not look so good and he agreed that he might need to dcome to the hospital. He ins unable to give a LKW but just states that it could have been going on earlier in the day.   Of note, he says that for the past year or so he has had a waxing/waning weakness of the left side, sometimes unable to use it at all and sometimes it is relatively easy to use.  He does not feel that tonight was anything significantly different.  At baseline, he is not really able to walk, he is able to take a step or 2, but primarily uses a wheelchair.  LKW: unclear tpa given?: no, out of window.  Premorbid modified rankin scale: 4  INTERVAL HISTORY Patient currently in the emergency department awaiting a bed for admission.  Vitals:   10/26/17 0800 10/26/17 0830 10/26/17 0900 10/26/17 0930  BP: (!) 156/73 (!) 155/64 (!) 144/79 (!) 151/68  Pulse: 78 80 85 81  Resp: 10 16 12 15   Temp:      TempSrc:      SpO2: 96% 95% 96% 95%  Weight:      Height:        CBC:  Recent Labs  Lab 10/25/17 2100 10/25/17 2105  WBC 7.1  --   NEUTROABS 4.4  --   HGB 14.0 14.6  HCT 45.0 43.0  MCV 94.5  --   PLT 176  --     Basic Metabolic Panel:  Recent Labs  Lab 10/25/17 2100 10/25/17 2105  NA 142 142  K 4.2 4.2  CL 103 102  CO2 29  --   GLUCOSE 120* 116*  BUN 14 15  CREATININE 1.00 0.90  CALCIUM 9.5  --    Lipid Panel:     Component Value Date/Time   CHOL 161 10/26/2017 0322   TRIG 103 10/26/2017 0322   HDL 43 10/26/2017 0322   CHOLHDL 3.7 10/26/2017 0322   VLDL 21 10/26/2017 0322   LDLCALC 97 10/26/2017 0322   HgbA1c:  Lab Results  Component Value Date   HGBA1C 5.9  05/18/2017   Urine Drug Screen:     Component Value Date/Time   LABOPIA NONE DETECTED 10/26/2017 0332   COCAINSCRNUR POSITIVE (A) 10/26/2017 0332   LABBENZ NONE DETECTED 10/26/2017 0332   AMPHETMU NONE DETECTED 10/26/2017 0332   THCU NONE DETECTED 10/26/2017 0332   LABBARB NONE DETECTED 10/26/2017 0332    Alcohol Level     Component Value Date/Time   ETH <10 10/25/2017 2100    IMAGING Ct Angio Head W Or Wo Contrast  Result Date: 10/25/2017 CLINICAL DATA:  Initial evaluation for acute left-sided deficits, right-sided gaze. EXAM: CT ANGIOGRAPHY HEAD AND NECK CT PERFUSION BRAIN TECHNIQUE: Multidetector CT imaging of the head and neck was performed using the standard protocol during bolus administration of intravenous contrast. Multiplanar CT image reconstructions and MIPs were obtained to evaluate the vascular anatomy. Carotid stenosis measurements (when applicable) are obtained utilizing NASCET criteria, using the distal internal carotid diameter as the denominator. Multiphase CT imaging of the brain was performed following IV bolus contrast  injection. Subsequent parametric perfusion maps were calculated using RAPID software. CONTRAST:  74mL ISOVUE-370 IOPAMIDOL (ISOVUE-370) INJECTION 76% COMPARISON:  Prior noncontrast head CT from earlier the same day as well as prior CTA from 07/18/2016 and arteriogram from 07/20/2016. FINDINGS: CTA NECK FINDINGS Aortic arch: Visualized aortic arch of normal caliber with normal branch pattern. Moderate atherosclerotic change about the visualized arch and origin of the great vessels with ulcerated soft plaque within the distal arch. Approximate 50% stenosis at the proximal right brachiocephalic artery just distal to its origin, similar to previous. Visualized subclavian arteries patent. Right carotid system: Right common carotid artery patent to the level of the distal right CCA stent. Stent appears patent proximally, but nearly entirely occluded at its mid and  distal aspect (series 5, image 70). Irregular attenuated flow is seen just distal to the stent, with subsequent occlusion (series 5, image 87). Right ICA otherwise occluded within the neck. Patent flow within the right external carotid artery and its branches. Left carotid system: Left common carotid artery patent from its origin to the bifurcation. Scattered multifocal plaque about the left bifurcation without hemodynamically significant stenosis. Left ICA patent distally to the skull base without stenosis, dissection, or occlusion. Incidental note made of left external carotid artery occlusion at its origin, with distal reconstitution via collateralization. Vertebral arteries: Both of the vertebral arteries arise from the subclavian arteries. Left vertebral artery dominant. Focal plaque at the origin of the vertebral arteries bilaterally with approximate moderate proximal right V1 stenosis. More mild stenosis at the origin of the left vertebral artery. Scattered multifocal plaque within the V2 and V3 segments bilaterally without high-grade stenosis. Vertebral arteries are patent to the skull base. Skeleton: No acute osseus abnormality. No discrete lytic or blastic osseous lesions. Patient is edentulous. Mild-to-moderate multilevel cervical spondylolysis, stable. Other neck: No other acute soft tissue abnormality within the neck. Normal thyroid. No adenopathy. Upper chest: Visualized upper chest demonstrates no acute finding. Partially visualized lungs are clear. Review of the MIP images confirms the above findings CTA HEAD FINDINGS Anterior circulation: Right ICA occluded at the skull base. Distal reconstitution at the supraclinoid segment via flow across the circle-of-Willis. Cavernous to supraclinoid right ICA stent grossly patent distally, not well assessed proximally. Petrous left ICA widely patent. Scattered multifocal plaque within the cavernous left ICA with approximate moderate stenosis, grossly similar to  previous. ICA termini patent. A1 segments patent bilaterally. Right A1 hypoplastic. Normal anterior communicating artery. Anterior cerebral arteries patent to their distal aspects without stenosis. Right A1 slightly hypoplastic. Right M1 widely patent. Normal right MCA bifurcation. No proximal right M2 occlusion. Distal right MCA branches well perfused. Left M1 widely patent. Normal left MCA bifurcation. No proximal left M2 occlusion. Distal left MCA branches well perfused. Posterior circulation: Vertebral arteries patent to the vertebrobasilar junction without stenosis. Left PICA patent. Right PICA not well seen. Basilar artery widely patent to its distal aspect. Superior cerebral arteries patent bilaterally. Both of the posterior cerebral arteries primarily supplied via the basilar and are widely patent to their distal aspects without stenosis. Venous sinuses: Grossly patent. Anatomic variants: None significant. Delayed phase: Not performed. Review of the MIP images confirms the above findings CT Brain Perfusion Findings: CBF (<30%) Volume: 83mL Perfusion (Tmax>6.0s) volume: 244mL Mismatch Volume: 258mL Infarction Location:Scattered watershed type infarcts seen involving the right MCA/ACA territory as well as the MCA/PCA territory as well as the deep watershed cerebral white matter. Large surrounding ischemic penumbra involving the entirety of the right MCA territory related to  delayed perfusion due to the occluded right ICA stent. IMPRESSION: 1. Occluded/nearly occluded proximal right ICA stent, with little to absent flow distally within the neck. Distal reconstitution at the right ICA terminus with grossly patent flow within the distal cavernous/supraclinoid right ICA stent. Right MCA is perfused without proximal or large vessel occlusion. 2. Acute watershed type infarcts involving the right MCA/ACA and MCA/PCA territories as well as the deep cerebral white matter. Large surrounding penumbra throughout the right  MCA distribution due to delayed perfusion from the occluded right ICA stent. 3. Grossly patent distal cavernous/supraclinoid right ICA stent. Evaluation for possible intra stent stenosis difficult given proximal occlusion. 4. Atheromatous plaque within the cavernous left ICA with approximate 50% stenosis, similar to previous. 5. Short-segment atheromatous stenosis of approximately 50% at the proximal innominate artery. 6. Moderate atheromatous stenoses of up to approximate 50% involving the proximal right V1 segment, with mild stenosis at the origin of the left vertebral artery. Otherwise patent vertebrobasilar system. 7. 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 These results were communicated to Dr. Leonel Ramsay at 9:57 pmon 9/16/2019by text page via the [facility] messaging system. Electronically Signed   By: Jeannine Boga M.D.   On: 10/25/2017 22:10   Ct Angio Neck W Or Wo Contrast  Result Date: 10/25/2017 CLINICAL DATA:  Initial evaluation for acute left-sided deficits, right-sided gaze. EXAM: CT ANGIOGRAPHY HEAD AND NECK CT PERFUSION BRAIN TECHNIQUE: Multidetector CT imaging of the head and neck was performed using the standard protocol during bolus administration of intravenous contrast. Multiplanar CT image reconstructions and MIPs were obtained to evaluate the vascular anatomy. Carotid stenosis measurements (when applicable) are obtained utilizing NASCET criteria, using the distal internal carotid diameter as the denominator. Multiphase CT imaging of the brain was performed following IV bolus contrast injection. Subsequent parametric perfusion maps were calculated using RAPID software. CONTRAST:  15mL ISOVUE-370 IOPAMIDOL (ISOVUE-370) INJECTION 76% COMPARISON:  Prior noncontrast head CT from earlier the same day as well as prior CTA from 07/18/2016 and arteriogram from 07/20/2016. FINDINGS: CTA NECK FINDINGS Aortic arch: Visualized aortic arch of  normal caliber with normal branch pattern. Moderate atherosclerotic change about the visualized arch and origin of the great vessels with ulcerated soft plaque within the distal arch. Approximate 50% stenosis at the proximal right brachiocephalic artery just distal to its origin, similar to previous. Visualized subclavian arteries patent. Right carotid system: Right common carotid artery patent to the level of the distal right CCA stent. Stent appears patent proximally, but nearly entirely occluded at its mid and distal aspect (series 5, image 70). Irregular attenuated flow is seen just distal to the stent, with subsequent occlusion (series 5, image 87). Right ICA otherwise occluded within the neck. Patent flow within the right external carotid artery and its branches. Left carotid system: Left common carotid artery patent from its origin to the bifurcation. Scattered multifocal plaque about the left bifurcation without hemodynamically significant stenosis. Left ICA patent distally to the skull base without stenosis, dissection, or occlusion. Incidental note made of left external carotid artery occlusion at its origin, with distal reconstitution via collateralization. Vertebral arteries: Both of the vertebral arteries arise from the subclavian arteries. Left vertebral artery dominant. Focal plaque at the origin of the vertebral arteries bilaterally with approximate moderate proximal right V1 stenosis. More mild stenosis at the origin of the left vertebral artery. Scattered multifocal plaque within the V2 and V3 segments bilaterally without high-grade stenosis. Vertebral arteries are patent to the  skull base. Skeleton: No acute osseus abnormality. No discrete lytic or blastic osseous lesions. Patient is edentulous. Mild-to-moderate multilevel cervical spondylolysis, stable. Other neck: No other acute soft tissue abnormality within the neck. Normal thyroid. No adenopathy. Upper chest: Visualized upper chest  demonstrates no acute finding. Partially visualized lungs are clear. Review of the MIP images confirms the above findings CTA HEAD FINDINGS Anterior circulation: Right ICA occluded at the skull base. Distal reconstitution at the supraclinoid segment via flow across the circle-of-Willis. Cavernous to supraclinoid right ICA stent grossly patent distally, not well assessed proximally. Petrous left ICA widely patent. Scattered multifocal plaque within the cavernous left ICA with approximate moderate stenosis, grossly similar to previous. ICA termini patent. A1 segments patent bilaterally. Right A1 hypoplastic. Normal anterior communicating artery. Anterior cerebral arteries patent to their distal aspects without stenosis. Right A1 slightly hypoplastic. Right M1 widely patent. Normal right MCA bifurcation. No proximal right M2 occlusion. Distal right MCA branches well perfused. Left M1 widely patent. Normal left MCA bifurcation. No proximal left M2 occlusion. Distal left MCA branches well perfused. Posterior circulation: Vertebral arteries patent to the vertebrobasilar junction without stenosis. Left PICA patent. Right PICA not well seen. Basilar artery widely patent to its distal aspect. Superior cerebral arteries patent bilaterally. Both of the posterior cerebral arteries primarily supplied via the basilar and are widely patent to their distal aspects without stenosis. Venous sinuses: Grossly patent. Anatomic variants: None significant. Delayed phase: Not performed. Review of the MIP images confirms the above findings CT Brain Perfusion Findings: CBF (<30%) Volume: 21mL Perfusion (Tmax>6.0s) volume: 258mL Mismatch Volume: 260mL Infarction Location:Scattered watershed type infarcts seen involving the right MCA/ACA territory as well as the MCA/PCA territory as well as the deep watershed cerebral white matter. Large surrounding ischemic penumbra involving the entirety of the right MCA territory related to delayed  perfusion due to the occluded right ICA stent. IMPRESSION: 1. Occluded/nearly occluded proximal right ICA stent, with little to absent flow distally within the neck. Distal reconstitution at the right ICA terminus with grossly patent flow within the distal cavernous/supraclinoid right ICA stent. Right MCA is perfused without proximal or large vessel occlusion. 2. Acute watershed type infarcts involving the right MCA/ACA and MCA/PCA territories as well as the deep cerebral white matter. Large surrounding penumbra throughout the right MCA distribution due to delayed perfusion from the occluded right ICA stent. 3. Grossly patent distal cavernous/supraclinoid right ICA stent. Evaluation for possible intra stent stenosis difficult given proximal occlusion. 4. Atheromatous plaque within the cavernous left ICA with approximate 50% stenosis, similar to previous. 5. Short-segment atheromatous stenosis of approximately 50% at the proximal innominate artery. 6. Moderate atheromatous stenoses of up to approximate 50% involving the proximal right V1 segment, with mild stenosis at the origin of the left vertebral artery. Otherwise patent vertebrobasilar system. 7. 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 These results were communicated to Dr. Leonel Ramsay at 9:57 pmon 9/16/2019by text page via the [facility] messaging system. Electronically Signed   By: Jeannine Boga M.D.   On: 10/25/2017 22:10   Mr Brain Wo Contrast  Result Date: 10/26/2017 CLINICAL DATA:  Follow-up examination for acute stroke. EXAM: MRI HEAD WITHOUT CONTRAST TECHNIQUE: Multiplanar, multiecho pulse sequences of the brain and surrounding structures were obtained without intravenous contrast. COMPARISON:  Prior CTA from 10/25/2017. FINDINGS: Brain: Examination moderately degraded by motion artifact. Generalized age-related cerebral atrophy. Patchy T2/FLAIR hyperintensity within the periventricular and  deep white matter both cerebral hemispheres most  consistent with chronic small vessel ischemic disease, mild to moderate nature. Scattered areas of encephalomalacia involving the right cerebral hemisphere, consistent with chronic right MCA territory infarcts, mostly watershed in nature. Scattered areas of restricted diffusion seen involving the cortical gray matter within the right parietal lobe (series 5, image 65, posterior right temporal region (series 5, image 56), and right insula (series 5, image 57), consistent with acute ischemic changes. Minimal punctate cortical involvement at the posterior right frontal region (series 5, image 68). No associated hemorrhage. Changes involve right MCA and MCA watershed zones, likely due to known right ICA stent occlusion. Additionally, there is mild diffusion abnormality involving the anterior inferior right cingulate (series 5, image 61), likely related to hypoperfusion of the right ACA due to stent occlusion. No associated hemorrhage. No other evidence for acute or subacute ischemia. Gray-white matter differentiation otherwise maintained. No evidence for acute intracranial hemorrhage. No mass lesion or midline shift. No hydrocephalus. No extra-axial fluid collection. Incidental note made of a partially empty sella. Vascular: Abnormal flow void within the right ICA to the level of the supraclinoid segment, consistent with known occlusion. Major intravascular flow voids otherwise maintained. Skull and upper cervical spine: Craniocervical junction normal. No focal marrow replacing lesion. Scalp soft tissues demonstrate no acute finding. Sinuses/Orbits: Patient status post ocular lens replacement bilaterally. Chronic left maxillary sinusitis. No significant mastoid effusion. Inner ear structures normal. Other: None. IMPRESSION: 1. Scattered areas of acute cortical ischemia involving the right MCA territory and right MCA watershed zones, most likely due to acute occlusion of  proximal right ICA stent as seen on prior CTA. No associated hemorrhage. 2. Additional mild right ACA ischemic changes involving the anterior inferior right cingulate, likely due to hypoperfusion given underlying hypoplastic right ACA in the setting of stent occlusion. 3. Abnormal flow void within the right ICA to the level of the supraclinoid segment, consistent with previously identified occlusion. 4. Underlying chronic watershed right MCA territory infarcts with mild to moderate chronic small vessel ischemic disease. Electronically Signed   By: Jeannine Boga M.D.   On: 10/26/2017 01:47   Ct Cerebral Perfusion W Contrast  Result Date: 10/25/2017 CLINICAL DATA:  Initial evaluation for acute left-sided deficits, right-sided gaze. EXAM: CT ANGIOGRAPHY HEAD AND NECK CT PERFUSION BRAIN TECHNIQUE: Multidetector CT imaging of the head and neck was performed using the standard protocol during bolus administration of intravenous contrast. Multiplanar CT image reconstructions and MIPs were obtained to evaluate the vascular anatomy. Carotid stenosis measurements (when applicable) are obtained utilizing NASCET criteria, using the distal internal carotid diameter as the denominator. Multiphase CT imaging of the brain was performed following IV bolus contrast injection. Subsequent parametric perfusion maps were calculated using RAPID software. CONTRAST:  33mL ISOVUE-370 IOPAMIDOL (ISOVUE-370) INJECTION 76% COMPARISON:  Prior noncontrast head CT from earlier the same day as well as prior CTA from 07/18/2016 and arteriogram from 07/20/2016. FINDINGS: CTA NECK FINDINGS Aortic arch: Visualized aortic arch of normal caliber with normal branch pattern. Moderate atherosclerotic change about the visualized arch and origin of the great vessels with ulcerated soft plaque within the distal arch. Approximate 50% stenosis at the proximal right brachiocephalic artery just distal to its origin, similar to previous. Visualized  subclavian arteries patent. Right carotid system: Right common carotid artery patent to the level of the distal right CCA stent. Stent appears patent proximally, but nearly entirely occluded at its mid and distal aspect (series 5, image 70). Irregular attenuated flow is seen just distal to the stent, with subsequent  occlusion (series 5, image 87). Right ICA otherwise occluded within the neck. Patent flow within the right external carotid artery and its branches. Left carotid system: Left common carotid artery patent from its origin to the bifurcation. Scattered multifocal plaque about the left bifurcation without hemodynamically significant stenosis. Left ICA patent distally to the skull base without stenosis, dissection, or occlusion. Incidental note made of left external carotid artery occlusion at its origin, with distal reconstitution via collateralization. Vertebral arteries: Both of the vertebral arteries arise from the subclavian arteries. Left vertebral artery dominant. Focal plaque at the origin of the vertebral arteries bilaterally with approximate moderate proximal right V1 stenosis. More mild stenosis at the origin of the left vertebral artery. Scattered multifocal plaque within the V2 and V3 segments bilaterally without high-grade stenosis. Vertebral arteries are patent to the skull base. Skeleton: No acute osseus abnormality. No discrete lytic or blastic osseous lesions. Patient is edentulous. Mild-to-moderate multilevel cervical spondylolysis, stable. Other neck: No other acute soft tissue abnormality within the neck. Normal thyroid. No adenopathy. Upper chest: Visualized upper chest demonstrates no acute finding. Partially visualized lungs are clear. Review of the MIP images confirms the above findings CTA HEAD FINDINGS Anterior circulation: Right ICA occluded at the skull base. Distal reconstitution at the supraclinoid segment via flow across the circle-of-Willis. Cavernous to supraclinoid right ICA  stent grossly patent distally, not well assessed proximally. Petrous left ICA widely patent. Scattered multifocal plaque within the cavernous left ICA with approximate moderate stenosis, grossly similar to previous. ICA termini patent. A1 segments patent bilaterally. Right A1 hypoplastic. Normal anterior communicating artery. Anterior cerebral arteries patent to their distal aspects without stenosis. Right A1 slightly hypoplastic. Right M1 widely patent. Normal right MCA bifurcation. No proximal right M2 occlusion. Distal right MCA branches well perfused. Left M1 widely patent. Normal left MCA bifurcation. No proximal left M2 occlusion. Distal left MCA branches well perfused. Posterior circulation: Vertebral arteries patent to the vertebrobasilar junction without stenosis. Left PICA patent. Right PICA not well seen. Basilar artery widely patent to its distal aspect. Superior cerebral arteries patent bilaterally. Both of the posterior cerebral arteries primarily supplied via the basilar and are widely patent to their distal aspects without stenosis. Venous sinuses: Grossly patent. Anatomic variants: None significant. Delayed phase: Not performed. Review of the MIP images confirms the above findings CT Brain Perfusion Findings: CBF (<30%) Volume: 25mL Perfusion (Tmax>6.0s) volume: 225mL Mismatch Volume: 261mL Infarction Location:Scattered watershed type infarcts seen involving the right MCA/ACA territory as well as the MCA/PCA territory as well as the deep watershed cerebral white matter. Large surrounding ischemic penumbra involving the entirety of the right MCA territory related to delayed perfusion due to the occluded right ICA stent. IMPRESSION: 1. Occluded/nearly occluded proximal right ICA stent, with little to absent flow distally within the neck. Distal reconstitution at the right ICA terminus with grossly patent flow within the distal cavernous/supraclinoid right ICA stent. Right MCA is perfused without  proximal or large vessel occlusion. 2. Acute watershed type infarcts involving the right MCA/ACA and MCA/PCA territories as well as the deep cerebral white matter. Large surrounding penumbra throughout the right MCA distribution due to delayed perfusion from the occluded right ICA stent. 3. Grossly patent distal cavernous/supraclinoid right ICA stent. Evaluation for possible intra stent stenosis difficult given proximal occlusion. 4. Atheromatous plaque within the cavernous left ICA with approximate 50% stenosis, similar to previous. 5. Short-segment atheromatous stenosis of approximately 50% at the proximal innominate artery. 6. Moderate atheromatous stenoses of up to approximate  50% involving the proximal right V1 segment, with mild stenosis at the origin of the left vertebral artery. Otherwise patent vertebrobasilar system. 7. 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 These results were communicated to Dr. Leonel Ramsay at 9:57 pmon 9/16/2019by text page via the [facility] messaging system. Electronically Signed   By: Jeannine Boga M.D.   On: 10/25/2017 22:10   Ct Head Code Stroke Wo Contrast  Result Date: 10/25/2017 CLINICAL DATA:  Code stroke. Right-sided gaze, left-sided neurologic deficits. EXAM: CT HEAD WITHOUT CONTRAST TECHNIQUE: Contiguous axial images were obtained from the base of the skull through the vertex without intravenous contrast. COMPARISON:  Head CT 09/19/2017 FINDINGS: Brain: There is no mass, hemorrhage or extra-axial collection. The size and configuration of the ventricles and extra-axial CSF spaces are normal. There are 2 subcortical old right frontal lobe infarcts, unchanged. There is hypoattenuation of the periventricular white matter, most commonly indicating chronic ischemic microangiopathy. Vascular: No abnormal hyperdensity of the major intracranial arteries or dural venous sinuses. No intracranial atherosclerosis. Skull:  The visualized skull base, calvarium and extracranial soft tissues are normal. Sinuses/Orbits: No fluid levels or advanced mucosal thickening of the visualized paranasal sinuses. No mastoid or middle ear effusion. The orbits are normal. ASPECTS [facility] Stroke Program Early CT Score) - Ganglionic level infarction (caudate, lentiform nuclei, internal capsule, insula, M1-M3 cortex): 7 - Supraganglionic infarction (M4-M6 cortex): 3 Total score (0-10 with 10 being normal): 10 IMPRESSION: 1. No acute hemorrhage. 2. ASPECTS is 10. 3. Old subcortical infarcts within the right frontal lobe, unchanged compared to 09/19/2017. These results were communicated to Dr. Roland Rack at 9:13 pm on 10/25/2017 by text page via the [facility] messaging system. Electronically Signed   By: Ulyses Jarred M.D.   On: 10/25/2017 21:13    PHYSICAL EXAM Unkempt middle aged Caucasian male not in distress. . Afebrile. Head is nontraumatic. Neck is supple without bruit.    Cardiac exam no murmur or gallop. Lungs are clear to auscultation. Distal pulses are well felt.  Neurological Exam :  Awake alert oriented x 3 dysarthricl speech bMild left lower face asymmetry. Tongue midline. Mild LUE and LLE drift.Left hemiparesis 3/5  Mild diminished fine finger movements on left. Orbits right over left upper extremity.  . Normal sensation . Normal coordination.Gait deferred   ASSESSMENT/PLAN Mr. JERREL TIBERIO is a 67 y.o. male with history of etoh abuse, CAD, CHF, COPD, DB, GERD, DVT, HTN, HLD, MI, OSA on CPAP, PVD, prior R brain stroke w/ R ICA stent placement and resultant L HPpresenting with worsening L HP.   Stroke:  right MCA territory and watershed infarcts secondary to occluded R ICA stent in setting of multiple uncontrolled risk factors  Code Stroke CT head No acute stroke.  Old right frontal subcortical infarcts. ASPECTS 10.     CTA head & neck occluded/nearly occluded R ICA stent with reconstitution at right ICA terminus.   Right MCA is perfused.  Right MCA/ACA and MCA/PCA territory infarcts.  Left ICA plaque 50%.  Proximal innominate artery 50%.  Proximal right V1 50%.  Incidental occlusion left external carotid artery at origin with distal reconstitution.  CT perfusion Large surrounding penumbra throughout right MCA distribution due to delayed perfusion from occluded right ICA stent.  MRI scattered are MCA territory and watershed zone infarcts due to proximal right ICA stent occlusion.  Additional right ACA ischemic changes likely due to hypoperfusion given hyperplastic right ACA.  Abnormal flow right ICA to  level of supraclinoid segment.  Underlying chronic watershed right MCA territory infarcts  Carotid Doppler pending  2D Echo pending  LDL 97  HgbA1c 5.9 in April  Lovenox 40 mg sq daily for VTE prophylaxis Diet Order            Diet NPO time specified  Diet effective now              clopidogrel 75 mg daily prior to admission, now on aspirin 325 mg daily and clopidogrel 75 mg daily.  Continue dual antiplatelet therapy (on rectal aspirin if unable to take p.o.'s)  Therapy recommendations: Pending  Disposition: Pending  Hypertension  Stable . Permissive hypertension (OK if < 220/120) but gradually normalize in 5-7 days . BP goal 140-160 d/t R ICA occlusion  Hyperlipidemia  Home meds:  lipitor 80, resumed in hospital  LDL 97, goal < 70  Continue statin at discharge  Diabetes type II  HgbA1c 5.9 in April, at goal < 7.0  Other Stroke Risk Factors  Advanced age  Cocaine positive UDS this admission. Hx cocaine use  Cigarette smoker, advised to stop smoking  ETOH use, advised to drink no more than 2 drink(s) a day  Obesity, Body mass index is 32.74 kg/m., recommend weight loss, diet and exercise as appropriate   Hx stroke/TIA  07/2016 patchy multifocal acute ischemic right MCA territory infarcts - likely embolic from right ICA stent  08/2014 New Non-dominant right posterior  frontal infarct secondary to hypotension in setting of  severe R paraclinoid stenosis  07/2014 Non-dominant watershed   infarcts likely due to hypotension with intracranial right carotid stenosis.  Coronary artery disease - MI  Obstructive sleep apnea  PDA  Chronic diastolic CHF  Other Active Problems  Baseline dementia on Namenda   Depression on Cymbalta, Requip   non-compliance  COPD  Hospital day # [place], MSN, APRN, ANVP-BC, AGPCNP-BC Advanced Practice Stroke Nurse [place] for Schedule & Pager information 10/26/2017 4:51 PM  I have personally examined this patient, reviewed notes, independently viewed imaging studies, participated in medical decision making and plan of care.ROS completed by me personally and pertinent positives fully documented  I have made any additions or clarifications directly to the above note. Agree with note above.  He presented with worsening of left hemiparesis  Due to right hemispheric watershed infarcts from chronic occlusion. Recommend mild induced hypertension with SBP goal 140-160. Patient counselle dto quit smoking and drugs and aggressive risk factor control. Dual antiplatelet therapy. Greater than 50% time during this 35 minute visit was spent on counselling and coordination of care and d/w patient and Dr Dede Query, [place] Pager: [phone] 10/26/2017 8:29 PM  To contact Stroke Continuity provider, please refer to [email or web address] After hours, contact General Neurology

## 2017-10-26 NOTE — Progress Notes (Addendum)
Central TEAM 1 - Stepdown/ICU TEAM  VENUS RUHE  CBJ:[id number] DOB: [birth year] DOA: 10/25/2017 PCP: Charlott Rakes, MD    Brief Narrative:  67 y.o. male with a hx of hypertension, hyperlipidemia, diet-controlled diabetes, COPD, prior stroke with left-sided weakness, GERD, gout, polysubstance abuse (alcohol, tobacco and cocaine), CAD, dCHF, DVT, OSA not on CPAP, PVD, and dementia who presented with worsening left-sided weakness, slurred speech, and blurry vision.  In the ED a CTA of neck and head noted a severely stenotic right ICA with distal perfusion deficit. Neurology, Dr. Leonel Ramsay was consulted.  Subjective: Pt is resting comfortably in bed.  He reports that his L side remains weaker than his baseline.  He is very hungry.  He denise cp, n/v, or sob.   Assessment & Plan:  R MCA territory and watershed infarcts secondary to occluded R ICA stent in setting of multiple uncontrolled risk factors Care is being directed by the Neurology/Stroke Team - ASA 325mg  added to Plavix 75mg  pt was already prescribed   COPD Quiescent at this time  GERD  Cont usual protonix tx   Gout Cont Allopurinol  Chronic diastolic congestive heart failure TTE 07/18/2016 showed EF 50-55% with grade 1 diastolic dysfunction - well compensated at this time - avoid DH in setting of acute strokes   CAD Quiescent   Tobacco abuse - Alcohol abuse - Cocaine abuse  Continue to educate on absolute need to abstain   Essential hypertension permissive hypertension   HLD  Cont lipitor  Dementia  Continue Namenda - ?if pt will be able to live independently going forward   Depression: Continue Cymbalta, Requip  DVT prophylaxis: lovenox Code Status: DNR - NO CODE Family Communication: no family present at time of exam  Disposition Plan: stable for tele bed   Consultants:  Neurology   Antimicrobials:  none   Objective: Blood pressure (!) 188/83, pulse 77, temperature 97.6 F (36.4  C), temperature source Temporal, resp. rate 18, height 5\' 10"  (1.778 m), weight 103.5 kg, SpO2 100 %.  Intake/Output Summary (Last 24 hours) at 10/26/2017 1647 Last data filed at 10/26/2017 0830 Gross per 24 hour  Intake -  Output 400 ml  Net -400 ml   Filed Weights   10/25/17 2110  Weight: 103.5 kg    Examination: General: No acute respiratory distress Lungs: Clear to auscultation bilaterally without wheezes or crackles Cardiovascular: Regular rate and rhythm without murmur gallop or rub normal S1 and S2 Abdomen: Nontender, nondistended, soft, bowel sounds positive, no rebound, no ascites, no appreciable mass Extremities: No significant cyanosis, clubbing, or edema bilateral lower extremities  CBC: Recent Labs  Lab 10/25/17 2100 10/25/17 2105  WBC 7.1  --   NEUTROABS 4.4  --   HGB 14.0 14.6  HCT 45.0 43.0  MCV 94.5  --   PLT 176  --    Basic Metabolic Panel: Recent Labs  Lab 10/25/17 2100 10/25/17 2105  NA 142 142  K 4.2 4.2  CL 103 102  CO2 29  --   GLUCOSE 120* 116*  BUN 14 15  CREATININE 1.00 0.90  CALCIUM 9.5  --    GFR: Estimated Creatinine Clearance: 94.7 mL/min (by C-G formula based on SCr of 0.9 mg/dL).  Liver Function Tests: Recent Labs  Lab 10/25/17 2100  AST 16  ALT 14  ALKPHOS 71  BILITOT 0.4  PROT 6.2*  ALBUMIN 3.7    Coagulation Profile: Recent Labs  Lab 10/25/17 2100  INR 0.98    HbA1C:  Hemoglobin A1C  Date/Time Value Ref Range Status  05/18/2017 10:48 AM 5.9  Final  12/25/2016 10:25 AM 6.2  Final   Hgb A1c MFr Bld  Date/Time Value Ref Range Status  07/18/2016 05:35 AM 5.8 (H) 4.8 - 5.6 % Final    Comment:    (NOTE)         Pre-diabetes: 5.7 - 6.4         Diabetes: >6.4         Glycemic control for adults with diabetes: <7.0   10/15/2015 07:04 PM 6.1 (H) 4.8 - 5.6 % Final    Comment:    (NOTE)         Pre-diabetes: 5.7 - 6.4         Diabetes: >6.4         Glycemic control for adults with diabetes: <7.0      CBG: Recent Labs  Lab 10/25/17 2058  GLUCAP 111*     Scheduled Meds: .  stroke: mapping our early stages of recovery book   Does not apply Once  . allopurinol  300 mg Oral Daily  . aspirin  300 mg Rectal Daily   Or  . aspirin  325 mg Oral Daily  . atorvastatin  80 mg Oral q1800  . clopidogrel  75 mg Oral Daily  . DULoxetine  60 mg Oral Daily  . enoxaparin (LOVENOX) injection  40 mg Subcutaneous Daily  . folic acid  1 mg Oral Daily  . LORazepam  0-4 mg Intravenous Q6H   Followed by  . [START ON 10/28/2017] LORazepam  0-4 mg Intravenous Q12H  . memantine  10 mg Oral BID  . mometasone-formoterol  2 puff Inhalation BID  . multivitamin with minerals  1 tablet Oral Daily  . nicotine  21 mg Transdermal Daily  . pantoprazole  40 mg Oral q morning - 10a  . polyethylene glycol  17 g Oral Daily  . pregabalin  150 mg Oral BID  . rOPINIRole  0.5 mg Oral QHS  . thiamine  100 mg Oral Daily   Or  . thiamine  100 mg Intravenous Daily   Continuous Infusions: . sodium chloride 75 mL/hr at 10/26/17 0831     LOS: 1 day   Cherene Altes, MD [facility] Office  [phone] Pager - Text Page per Shea Evans  If 7PM-7AM, please contact night-coverage per Amion 10/26/2017, 4:47 PM

## 2017-10-26 NOTE — Progress Notes (Signed)
Pt admitted to 3W30 at this time.  Alert and oriented.  Denies pain, or SOB. His only complaint is that he wants to eat.  Explained that he failed his swallow screen and that he is NPO; called to alert ST of Swallow eval.Left side very weak, with minimal movement at times.  Pt states "sometimes it's better."  He states he walks very short distances in his home with a cane, and holding on to furniture.  He has his clothing with him and a pack of cigarettes in the closet.  No cell phone.  States he does wear glasses but they are not with him, and he does not wear dentures. Bed alarm set, call bell within reach.  Pt verbalizes understanding to call before attempting to get OOB.

## 2017-10-27 ENCOUNTER — Inpatient Hospital Stay (HOSPITAL_COMMUNITY): Payer: Medicare Other

## 2017-10-27 DIAGNOSIS — F321 Major depressive disorder, single episode, moderate: Secondary | ICD-10-CM

## 2017-10-27 DIAGNOSIS — I350 Nonrheumatic aortic (valve) stenosis: Secondary | ICD-10-CM

## 2017-10-27 DIAGNOSIS — M1000 Idiopathic gout, unspecified site: Secondary | ICD-10-CM

## 2017-10-27 DIAGNOSIS — E7800 Pure hypercholesterolemia, unspecified: Secondary | ICD-10-CM

## 2017-10-27 LAB — MAGNESIUM: Magnesium: 2.2 mg/dL (ref 1.7–2.4)

## 2017-10-27 LAB — CBC
HCT: 43.6 % (ref 39.0–52.0)
Hemoglobin: 13.6 g/dL (ref 13.0–17.0)
MCH: 28.8 pg (ref 26.0–34.0)
MCHC: 31.2 g/dL (ref 30.0–36.0)
MCV: 92.4 fL (ref 78.0–100.0)
Platelets: 159 10*3/uL (ref 150–400)
RBC: 4.72 MIL/uL (ref 4.22–5.81)
RDW: 14.2 % (ref 11.5–15.5)
WBC: 5.3 10*3/uL (ref 4.0–10.5)

## 2017-10-27 LAB — ECHOCARDIOGRAM COMPLETE
Height: 70 in
Weight: 3650.82 oz

## 2017-10-27 LAB — COMPREHENSIVE METABOLIC PANEL
ALBUMIN: 3.2 g/dL — AB (ref 3.5–5.0)
ALT: 14 U/L (ref 0–44)
AST: 14 U/L — AB (ref 15–41)
Alkaline Phosphatase: 62 U/L (ref 38–126)
Anion gap: 8 (ref 5–15)
BUN: 8 mg/dL (ref 8–23)
CO2: 28 mmol/L (ref 22–32)
Calcium: 9.7 mg/dL (ref 8.9–10.3)
Chloride: 108 mmol/L (ref 98–111)
Creatinine, Ser: 0.71 mg/dL (ref 0.61–1.24)
GFR calc non Af Amer: >60 mL/min (ref >60)
GLUCOSE: 93 mg/dL (ref 70–99)
Potassium: 4 mmol/L (ref 3.5–5.1)
Sodium: 144 mmol/L (ref 135–145)
Total Bilirubin: 1 mg/dL (ref 0.3–1.2)
Total Protein: 5.8 g/dL — ABNORMAL LOW (ref 6.5–8.1)

## 2017-10-27 LAB — HEMOGLOBIN A1C
Hgb A1c MFr Bld: 5.5 % (ref 4.8–5.6)
Mean Plasma Glucose: 111 mg/dL

## 2017-10-27 NOTE — Evaluation (Addendum)
Clinical/Bedside Swallow Evaluation Patient Details  Name: HAKEEM FRAZZINI MRN: [id number] Date of Birth: 26-Oct-1950  Today's Date: 10/27/2017 Time: SLP Start Time (ACUTE ONLY): 0808 SLP Stop Time (ACUTE ONLY): 0849 SLP Time Calculation (min) (ACUTE ONLY): 41 min  Past Medical History:  Past Medical History:  Diagnosis Date  . Alcohol abuse    H/O  . Anxiety   . Arthritis   . Asthma   . CAD (coronary artery disease) 05/27/10   Cath: severe single vessell CAD left cx midportion obtuse marginal 2 to 3.  [name] CHF (congestive heart failure) ([place])   . COPD (chronic obstructive pulmonary disease) ([place])   . COPD (chronic obstructive pulmonary disease) ([place])   . Depression   . Diabetes mellitus, type 2 ([place]) 03/13/2015  . GERD (gastroesophageal reflux disease)   . HCAP (healthcare-associated pneumonia) 10/15/2015  . History of DVT of lower extremity   . Hyperlipemia   . Hyperlipidemia   . Hypertension   . Myocardial infarction ([place]) 2000  . Orbital fracture ([place]) 12/2012  . OSA on CPAP   . Peripheral vascular disease, unspecified ([place])    08/20/10 doppler: increase in right ABI post-op. Left ABI stable. S/P bi-fem bypass surgery  . Pneumonia 04/04/2012  . Shortness of breath   . Stroke ([place])   . Tobacco abuse    Past Surgical History:  Past Surgical History:  Procedure Laterality Date  . abdoninal ao angio & bifem angio  05/27/10   Patent graft, occluded bil stents with no retrograde flow into the hypogastric arteries. 100% occl left ant. tibial artery, 70% to 80% to 100% stenosis right superficial fem artery above adductor canal. 100 % occl right ant. tibial vessell  . Aortogram w/ PTCA  09/292003  . BACK SURGERY    . CARDIAC CATHETERIZATION  05/27/10   severe CAD left cx  . CHOLECYSTECTOMY    . ESOPHAGOGASTRIC FUNDOPLICATION    . EYE SURGERY    . FEMORAL BYPASS  08/19/10   Right Fem-Pop  . IR ANGIO INTRA EXTRACRAN SEL COM CAROTID INNOMINATE BILAT MOD SED  07/20/2016  . IR ANGIO  VERTEBRAL SEL SUBCLAVIAN INNOMINATE UNI R MOD SED  07/20/2016  . IR ANGIO VERTEBRAL SEL VERTEBRAL UNI L MOD SED  07/20/2016  . IR RADIOLOGIST EVAL & MGMT  05/27/2016  . JOINT REPLACEMENT    . PERIPHERAL VASCULAR CATHETERIZATION N/A 12/10/2014   Procedure: Abdominal Aortogram;  Surgeon: Angelia Mould, MD;  Location: [facility] CV [facility];  Service: Cardiovascular;  Laterality: N/A;  . RADIOLOGY WITH ANESTHESIA N/A 02/08/2015   Procedure: RADIOLOGY WITH ANESTHESIA;  Surgeon: Luanne Bras, MD;  Location: [place];  Service: Radiology;  Laterality: N/A;  . TEE WITHOUT CARDIOVERSION N/A 08/29/2014   Procedure: TRANSESOPHAGEAL ECHOCARDIOGRAM (TEE);  Surgeon: Josue Hector, MD;  Location: [facility] ENDOSCOPY;  Service: Cardiovascular;  Laterality: N/A;  . TOTAL KNEE ARTHROPLASTY     HPI:  67 yo male adm to [facility] with worsening left sided weakness, slurred speech and blurry vision.  Pt found to have a right MCA CVA.  H/o GERD, tobacco, ETOH, cocaine use.  Pt did not pass RNSSS and SLP swallow evaluation was ordered.  Pt has anterior cervical osteophytes at c5-c6.  Also fusion C2-C3.     Assessment / Plan / Recommendation Clinical Impression  Pt has no significant CN deficits.  His voice is raspy which he states is normal for him for years.  CSE completed with pt passing 3 ounce water test without deficits.  He does report h/o coughing on occasion with food and drink - and he is edentulous.  Encouraged pt to take small bites/sips and chew thoroughly and check for oral residuals on left.  Further advised him to increased aspiration pna risk if he is having problems swallowing using teach back.  Pt's caregiver arrived during session and was educated to recommendations.  Also instructed pt to monitor foods/drinks he may be coughing with today and report to SLP tomorrow.  He denies difficulty with pills and has reflux issues.    Recommend regular/thin diet to allow pt to choose foods he can tolerate well.  Given  h/o dysphagia, will follow up x1 re: swallow to assure instrumental evaluation not indicated.  SLP Visit Diagnosis: Dysphagia, unspecified (R13.10)    Aspiration Risk  Mild aspiration risk    Diet Recommendation   Regular/thin       Other  Recommendations Oral Care Recommendations: Oral care BID   Follow up Recommendations    TBD     Frequency and Duration min 1 x/week  1 week       Prognosis Prognosis for Safe Diet Advancement: Good      Swallow Study   General Date of Onset: 10/27/17 HPI: 67 yo male adm to [facility] with worsening left sided weakness, slurred speech and blurry vision.  Pt found to have a right MCA CVA.  H/o GERD, tobacco, ETOH, cocaine use.  Pt did not pass RNSSS and SLP swallow evaluation was ordered.  Pt has anterior cervical osteophytes at c5-c6.  Also fusion C2-C3.   Type of Study: Bedside Swallow Evaluation Diet Prior to this Study: NPO Respiratory Status: Room air History of Recent Intubation: No Behavior/Cognition: Alert;Cooperative;Pleasant mood Oral Cavity Assessment: Within Functional Limits Oral Care Completed by SLP: No Oral Cavity - Dentition: Edentulous Vision: Functional for self-feeding Self-Feeding Abilities: Able to feed self;Needs set up Patient Positioning: Upright in bed Baseline Vocal Quality: Hoarse;Other (comment)(gravely voice) Volitional Cough: Strong Volitional Swallow: Able to elicit    Oral/Motor/Sensory Function Overall Oral Motor/Sensory Function: Within functional limits   Ice Chips Ice chips: Within functional limits Presentation: Spoon   Thin Liquid Thin Liquid: Within functional limits Presentation: Cup;Straw;Self Fed    Nectar Thick Nectar Thick Liquid: Not tested   Honey Thick Honey Thick Liquid: Not tested   Puree Puree: Within functional limits Presentation: Self Fed;Spoon   Solid     Solid: Within functional limits Presentation: Self Fed;Spoon      Macario Golds 10/27/2017,8:50 AM   Luanna Salk, MS [facility] SLP Acute Rehab Services Pager [phone] Office [phone]

## 2017-10-27 NOTE — Evaluation (Signed)
Speech Language Pathology Evaluation Patient Details Name: [name] MRN: [id number] DOB: [birth year] Today's Date: 10/27/2017 Time: 2229-7989 SLP Time Calculation (min) (ACUTE ONLY): 17 min  Problem List:  Patient Active Problem List   Diagnosis Date Noted  . Left shoulder pain 09/28/2017  . Cerebral thrombosis with cerebral infarction 07/18/2016  . Acute ischemic stroke ([place])   . Left hemiplegia ([place])   . Stroke-like symptoms 07/17/2016  . Insomnia 01/29/2016  . Depression 01/29/2016  . Alcohol abuse with intoxication ([place]) 10/16/2015  . Psoriasiform dermatitis 07/12/2015  . Dementia due to another medical condition 07/12/2015  . Urinary incontinence 03/14/2015  . Diabetes mellitus, type 2 ([place]) 03/13/2015  . Hemiparesis affecting left side as late effect of cerebrovascular accident ([place]) 02/18/2015  . Cerebrovascular accident (CVA) due to stenosis of right carotid artery ([place])   . Coronary artery disease involving native coronary artery of native heart without angina pectoris   . Tachypnea   . Thrombocytopenia ([place])   . History of stroke   . Stroke (cerebrum) ([place]) 02/06/2015  . Arterial ischemic stroke, MCA (middle cerebral artery), right, acute ([place])   . DOE (dyspnea on exertion) 11/23/2014  . PAF (paroxysmal atrial fibrillation) ([place]) 11/23/2014  . HLD (hyperlipidemia) 11/01/2014  . Tobacco use disorder 11/01/2014  . Diastolic CHF, acute on chronic (HCC) 11/01/2014  . Cerebral infarction due to stenosis of right carotid artery ([place]) 11/01/2014  . Carotid stenosis 11/01/2014  . Hyperkalemia 10/24/2014  . H/O total knee replacement 10/24/2014  . Essential hypertension   . Polysubstance abuse ([place]) 08/28/2014  . Cocaine abuse ([place])   . Intracranial carotid stenosis   . Headache around the eyes 08/27/2014  . Acute CVA (cerebrovascular accident) ([place]) 08/27/2014  . CVA (cerebral vascular accident) ([place]) 08/26/2014  . History of DVT (deep vein thrombosis)   .  Alcoholism ([place])   . Embolic stroke ([place])   . Cerebral infarction due to unspecified mechanism   . Hypotension   . AKI (acute kidney injury) ([place])   . Stroke ([place])   . CVA (cerebral infarction) 08/01/2014  . Alcohol dependence with withdrawal with complication ([place]) 21/19/4174  . DVT (deep venous thrombosis) ([place]) 02/22/2014  . Lower extremity edema 02/21/2014  . Chronic diastolic congestive heart failure ([place]) 11/21/2013  . CAD (coronary artery disease) 11/21/2013  . Alcohol abuse 11/21/2013  . GERD (gastroesophageal reflux disease) 08/10/2013  . Gout 08/10/2013  . Tobacco abuse 07/26/2013  . Trichiasis without entropion 04/16/2013  . Dizziness 04/10/2013  . Eyelid lesion 01/24/2013  . Enophthalmos due to trauma 01/19/2013  . Medial orbital wall fracture (HCC) 01/19/2013  . Closed blow-out fracture of floor of orbit ([place]) 01/19/2013  . Binocular vision disorder with diplopia 01/03/2013  . Fracture of inferior orbital wall ([place]) 01/03/2013  . Fracture of orbital floor ([place]) 01/03/2013  . Pain of left eye 01/03/2013  . Peripheral vascular disease ([place]) 07/27/2012  . COPD (chronic obstructive pulmonary disease) ([place]) 04/04/2012  . HTN (hypertension) 04/04/2012   Past Medical History:  Past Medical History:  Diagnosis Date  . Alcohol abuse    H/O  . Anxiety   . Arthritis   . Asthma   . CAD (coronary artery disease) 05/27/10   Cath: severe single vessell CAD left cx midportion obtuse marginal 2 to 3.  [name] CHF (congestive heart failure) ([place])   . COPD (chronic obstructive pulmonary disease) ([place])   . COPD (chronic obstructive pulmonary disease) ([place])   . Depression   . Diabetes mellitus, type  2 ([place]) 03/13/2015  . GERD (gastroesophageal reflux disease)   . HCAP (healthcare-associated pneumonia) 10/15/2015  . History of DVT of lower extremity   . Hyperlipemia   . Hyperlipidemia   . Hypertension   . Myocardial infarction ([place]) 2000  . Orbital fracture ([place]) 12/2012  . OSA on  CPAP   . Peripheral vascular disease, unspecified ([place])    08/20/10 doppler: increase in right ABI post-op. Left ABI stable. S/P bi-fem bypass surgery  . Pneumonia 04/04/2012  . Shortness of breath   . Stroke ([place])   . Tobacco abuse    Past Surgical History:  Past Surgical History:  Procedure Laterality Date  . abdoninal ao angio & bifem angio  05/27/10   Patent graft, occluded bil stents with no retrograde flow into the hypogastric arteries. 100% occl left ant. tibial artery, 70% to 80% to 100% stenosis right superficial fem artery above adductor canal. 100 % occl right ant. tibial vessell  . Aortogram w/ PTCA  09/292003  . BACK SURGERY    . CARDIAC CATHETERIZATION  05/27/10   severe CAD left cx  . CHOLECYSTECTOMY    . ESOPHAGOGASTRIC FUNDOPLICATION    . EYE SURGERY    . FEMORAL BYPASS  08/19/10   Right Fem-Pop  . IR ANGIO INTRA EXTRACRAN SEL COM CAROTID INNOMINATE BILAT MOD SED  07/20/2016  . IR ANGIO VERTEBRAL SEL SUBCLAVIAN INNOMINATE UNI R MOD SED  07/20/2016  . IR ANGIO VERTEBRAL SEL VERTEBRAL UNI L MOD SED  07/20/2016  . IR RADIOLOGIST EVAL & MGMT  05/27/2016  . JOINT REPLACEMENT    . PERIPHERAL VASCULAR CATHETERIZATION N/A 12/10/2014   Procedure: Abdominal Aortogram;  Surgeon: Angelia Mould, MD;  Location: [facility];  Service: Cardiovascular;  Laterality: N/A;  . RADIOLOGY WITH ANESTHESIA N/A 02/08/2015   Procedure: RADIOLOGY WITH ANESTHESIA;  Surgeon: Luanne Bras, MD;  Location: [facility];  Service: Radiology;  Laterality: N/A;  . TEE WITHOUT CARDIOVERSION N/A 08/29/2014   Procedure: TRANSESOPHAGEAL ECHOCARDIOGRAM (TEE);  Surgeon: Josue Hector, MD;  Location: [facility];  Service: Cardiovascular;  Laterality: N/A;  . TOTAL KNEE ARTHROPLASTY     HPI:  68 yo male adm to [facility] with worsening left sided weakness, slurred speech and blurry vision.  Pt found to have a right MCA CVA.  H/o GERD, tobacco, ETOH, cocaine use.  Pt did not pass RNSSS and SLP swallow  evaluation was ordered.  Pt has anterior cervical osteophytes at c5-c6.  Also fusion C2-C3.     Assessment / Plan / Recommendation Clinical Impression  Patient presents with functional memory deficits (working and short term memory) which he reports has been present since his prior CVA.  Fluent speech/language noted without aphasia and possible minimal dysarthria *but may be dialect.  Reading was [facility] for pt and he states blurred vision has improved.   Uncertain to level of premorbid deficits but suspect possible exacerbation of auditory memory deficits.  Will follow up for provide functional cognitive linguistic compensation strategies and to conduct MOCA.      SLP Assessment  SLP Recommendation/Assessment: Patient needs continued Speech Lanaguage Pathology Services SLP Visit Diagnosis: Cognitive communication deficit (R41.841);Dysphagia, unspecified (R13.10)    Follow Up Recommendations  (TBD)    Frequency and Duration min 1 x/week  1 week      SLP Evaluation Cognition  Arousal/Alertness: Awake/alert Orientation Level: Oriented X4 Attention: Sustained Sustained Attention: Appears intact Memory: Impaired Memory Impairment: Decreased recall of new information(did not recall items SLP was  gathering for him after approximately 5 minutes, recalled one with cue) Awareness: Appears intact Problem Solving: Appears intact       Comprehension  Auditory Comprehension Overall Auditory Comprehension: Appears within functional limits for tasks assessed Yes/No Questions: Not tested Commands: Within Functional Limits Conversation: Complex Visual Recognition/Discrimination Discrimination: Not tested Reading Comprehension Reading Status: Within funtional limits(reading swallow precaution sign)    Expression Expression Primary Mode of Expression: Verbal Verbal Expression Overall Verbal Expression: Appears within functional limits for tasks assessed Initiation: No impairment Repetition:  Impaired Level of Impairment: Sentence level(able to paraphrase adequately but missed specifics) Naming: No impairment Pragmatics: No impairment Non-Verbal Means of Communication: Not applicable Written Expression Dominant Hand: Right Written Expression: Not tested   Oral / Motor  Oral Motor/Sensory Function Overall Oral Motor/Sensory Function: Within functional limits Motor Speech Overall Motor Speech: Appears within functional limits for tasks assessed Phonation: Hoarse;Other (comment)(hoarse gravely voice) Resonance: Hyponasality Articulation: Within functional limitis Intelligibility: Intelligible(rapid rate of speech but suspect is dialect) Motor Planning: Not tested   GO                    Macario Golds 10/27/2017, 9:21 AM  Luanna Salk, MS [facility] SLP Acute Rehab Services Pager [phone] Office [phone]

## 2017-10-27 NOTE — Evaluation (Signed)
Occupational Therapy Evaluation Patient Details Name: [name] MRN: [id number] DOB: Mar 22, 1950 Today's Date: 10/27/2017    History of Present Illness Pt is a 67 y.o. male with medical history significant of hypertension, hyperlipidemia, diet-controlled diabetes, COPD, stroke with left-sided weakness, GERD, gout, polysubstance abuse (alcohol, tobacco and cocaine), CAD, dCHF, DVT, OSA not on CPAP, PVD, dementia, who presented on 10/25/17 with worsening left-sided weakness, slurred speech and blurry vision. MRI + for R MCA territory and watershed infarcts secondary to occulded R ICA stent.    Clinical Impression   PTA patient reports using cane for mobility with independence within his home, but required some assist with ADLs (bathing and dressing) from aide who assists 3hr/day with ADLs, IADls and med mgmt.  He was admitted for above and limited by impaired balance, L hemiparesis, decreased activity tolerance, impaired cognition and safety awareness.  Currently completes UB ADL with min assist, LB ADL with mod assist, transfers with min assist +2 for safety, and toileting with min assist. He reports typically using sling due to L UE subluxation, and therefore unsure how much of L sided weakness is new vs old. Based on performance today, recommend continued OT services while admitted and SNF rehab level thearpy in order to optimize independence with ADLs, functional mobility and transfers.     Follow Up Recommendations  SNF;Supervision/Assistance - 24 hour    Equipment Recommendations  Other (comment)(TBD at next venue of care)    Recommendations for Other Services       Precautions / Restrictions Precautions Precautions: Fall Precaution Comments: L UE subluxation  Restrictions Weight Bearing Restrictions: No      Mobility Bed Mobility Overal bed mobility: Needs Assistance Bed Mobility: Supine to Sit     Supine to sit: Min assist;HOB elevated     General bed mobility  comments: min assist for trunk support to ascend  Transfers Overall transfer level: Needs assistance Equipment used: 1 person hand held assist Transfers: Sit to/from Stand Sit to Stand: Min assist;+2 safety/equipment         General transfer comment: min assist for safety and balance     Balance Overall balance assessment: Needs assistance Sitting-balance support: Single extremity supported;Feet supported Sitting balance-Leahy Scale: Fair     Standing balance support: Single extremity supported;During functional activity Standing balance-Leahy Scale: Poor Standing balance comment: reliant on UE support                           ADL either performed or assessed with clinical judgement   ADL Overall ADL's : Needs assistance/impaired Eating/Feeding: Set up;Sitting   Grooming: Set up;Sitting   Upper Body Bathing: Minimal assistance;Sitting   Lower Body Bathing: Moderate assistance;Sit to/from stand   Upper Body Dressing : Minimal assistance;Sitting   Lower Body Dressing: Moderate assistance;Sit to/from stand;+2 for safety/equipment Lower Body Dressing Details (indicate cue type and reason): able to adjust socks, but anticipate unable to don/doff; +2 in standing for safety  Toilet Transfer: Minimal assistance;+2 for safety/equipment;Ambulation Toilet Transfer Details (indicate cue type and reason): simulated in room, hand held assist  Toileting- Clothing Manipulation and Hygiene: Sit to/from stand;Minimal assistance       Functional mobility during ADLs: Minimal assistance;+2 for safety/equipment(hand held assistance ) General ADL Comments: Pt completed bed mobility, func mobility in room, and ADLs.      Vision Patient Visual Report: Blurring of vision Additional Comments: further assessment recommended, functionally [facility]      Perception  Praxis      Pertinent Vitals/Pain Pain Assessment: No/denies pain     Hand Dominance Right   Extremity/Trunk  Assessment Upper Extremity Assessment Upper Extremity Assessment: LUE deficits/detail(reports impairments from previous CVA) LUE Deficits / Details: grossly flaccid with 1/5 MMT at elbow extension, non functional use with shoulder subluxation (reports wearing sling at home)  LUE Sensation: WNL LUE Coordination: decreased fine motor;decreased gross motor   Lower Extremity Assessment Lower Extremity Assessment: Defer to PT evaluation       Communication Communication Communication: No difficulties   Cognition Arousal/Alertness: Awake/alert Behavior During Therapy: WFL for tasks assessed/performed Overall Cognitive Status: History of cognitive impairments - at baseline Area of Impairment: Attention;Following commands;Safety/judgement;Awareness;Problem solving                   Current Attention Level: Selective   Following Commands: Follows multi-step commands inconsistently;Follows multi-step commands with increased time Safety/Judgement: Decreased awareness of safety;Decreased awareness of deficits Awareness: Emergent Problem Solving: Slow processing;Requires verbal cues     General Comments       Exercises     Shoulder Instructions      Home Living Family/patient expects to be discharged to:: Private residence Living Arrangements: Alone Available Help at Discharge: Personal care attendant(aide 3 hrs/day)         Home Layout: One level     Bathroom Shower/Tub: Occupational psychologist: Standard     Home Equipment: Cane - single point;Shower seat          Prior Functioning/Environment Level of Independence: Needs assistance  Gait / Transfers Assistance Needed: reports able to complete mobility using cane without assistance  ADL's / Homemaking Assistance Needed: has aide who assist with bathing and dressing, but able to complete toilet transfers/toileting without assist             OT Problem List: Decreased strength;Decreased range of  motion;Decreased activity tolerance;Impaired balance (sitting and/or standing);Decreased coordination;Impaired vision/perception;Decreased safety awareness;Decreased cognition;Decreased knowledge of precautions;Impaired UE functional use      OT Treatment/Interventions: Self-care/ADL training;Therapeutic exercise;Neuromuscular education;Energy conservation;DME and/or AE instruction;Therapeutic activities;Patient/family education;Balance training;Cognitive remediation/compensation    OT Goals(Current goals can be found in the care plan section) Acute Rehab OT Goals Patient Stated Goal: to get stronger OT Goal Formulation: With patient Time For Goal Achievement: 11/10/17 Potential to Achieve Goals: Good  OT Frequency: Min 2X/week   Barriers to D/C:            Co-evaluation PT/OT/SLP Co-Evaluation/Treatment: Yes     OT goals addressed during session: ADL's and self-care;Other (comment)(transfers)      AM-PAC PT "6 Clicks" Daily Activity     Outcome Measure Help from another person eating meals?: A Little Help from another person taking care of personal grooming?: A Little Help from another person toileting, which includes using toliet, bedpan, or urinal?: A Little Help from another person bathing (including washing, rinsing, drying)?: A Lot Help from another person to put on and taking off regular upper body clothing?: A Little Help from another person to put on and taking off regular lower body clothing?: A Lot 6 Click Score: 16   End of Session Equipment Utilized During Treatment: Gait belt Nurse Communication: Mobility status  Activity Tolerance: Patient tolerated treatment well Patient left: in chair;with chair alarm set;with call bell/phone within reach  OT Visit Diagnosis: Unsteadiness on feet (R26.81);Other abnormalities of gait and mobility (R26.89);Muscle weakness (generalized) (M62.81);Hemiplegia and hemiparesis;History of falling (Z91.81) Hemiplegia - Right/Left:  Left Hemiplegia -  dominant/non-dominant: Non-Dominant Hemiplegia - caused by: Cerebral infarction                Time: 6773-7366 OT Time Calculation (min): 19 min Charges:  OT General Charges $OT Visit: 1 Visit OT Evaluation $OT Eval Moderate Complexity: [place], OT Acute Rehabilitation Services Pager [phone] Office [phone]   Delight Stare 10/27/2017, 5:08 PM

## 2017-10-27 NOTE — Progress Notes (Signed)
  Echocardiogram 2D Echocardiogram has been performed.  [name] 10/27/2017, 10:29 AM

## 2017-10-27 NOTE — Evaluation (Signed)
Physical Therapy Evaluation Patient Details Name: [name] MRN: [id number] DOB: 07/01/1950 Today's Date: 10/27/2017   History of Present Illness  Pt is a 67 y.o. male with medical history significant of hypertension, hyperlipidemia, diet-controlled diabetes, COPD, stroke with left-sided weakness, GERD, gout, polysubstance abuse (alcohol, tobacco and cocaine), CAD, dCHF, DVT, OSA not on CPAP, PVD, dementia, who presented on 10/25/17 with worsening left-sided weakness, slurred speech and blurry vision. MRI + for R MCA territory and watershed infarcts secondary to occulded R ICA stent.   Clinical Impression  Pt admitted with above diagnosis. Pt currently with functional limitations due to the deficits listed below (see PT Problem List). No family/caregiver present to confirm patient's PLOF, but he may be close to baseline functionally. Uses a cane for mobility and requires assist with ADL's from aide who assists 3 hours/day. Currently presenting with decreased functional mobility secondary to decreased endurance, cognitive deficits, impaired balance, and poor safety awareness. Recommending SNF level rehab due to decreased caregiver support and patient's high likelihood of recurrent falls. Pt will benefit from skilled PT to increase their independence and safety with mobility to allow discharge to the venue listed below.       Follow Up Recommendations SNF;Supervision/Assistance - 24 hour    Equipment Recommendations  Other (comment)(defer)    Recommendations for Other Services       Precautions / Restrictions Precautions Precautions: Fall Precaution Comments: L UE subluxation  Restrictions Weight Bearing Restrictions: No      Mobility  Bed Mobility Overal bed mobility: Needs Assistance Bed Mobility: Supine to Sit     Supine to sit: Min assist;HOB elevated     General bed mobility comments: min assist for trunk support to ascend  Transfers Overall transfer level: Needs  assistance Equipment used: 1 person hand held assist Transfers: Sit to/from Stand Sit to Stand: Min assist;+2 safety/equipment         General transfer comment: min assist for safety and balance   Ambulation/Gait Ambulation/Gait assistance: Min assist;+2 safety/equipment Gait Distance (Feet): 20 Feet Assistive device: 1 person hand held assist Gait Pattern/deviations: Step-through pattern;Decreased weight shift to right;Decreased stride length Gait velocity: decreased Gait velocity interpretation: <1.31 ft/sec, indicative of household ambulator General Gait Details: Increased lateral lean to right. relying on HHA and/or furniture walking.   Stairs            Wheelchair Mobility    Modified Rankin (Stroke Patients Only)       Balance Overall balance assessment: Needs assistance Sitting-balance support: Single extremity supported;Feet supported Sitting balance-Leahy Scale: Fair     Standing balance support: Single extremity supported;During functional activity Standing balance-Leahy Scale: Poor Standing balance comment: reliant on UE support                             Pertinent Vitals/Pain Pain Assessment: No/denies pain    Home Living Family/patient expects to be discharged to:: Private residence Living Arrangements: Alone Available Help at Discharge: Personal care attendant(aide 3 hrs/day) Type of Home: Apartment Home Access: Elevator     Home Layout: One level Home Equipment: Cane - single point;Shower seat;Walker - 4 wheels      Prior Function Level of Independence: Needs assistance   Gait / Transfers Assistance Needed: reports able to complete mobility using cane without assistance . States he uses sling for LUE subluxation  ADL's / Homemaking Assistance Needed: has aide who assist with bathing and dressing, but able to complete  toilet transfers/toileting without assist .         Hand Dominance   Dominant Hand: Right     Extremity/Trunk Assessment   Upper Extremity Assessment Upper Extremity Assessment: LUE deficits/detail LUE Deficits / Details: grossly flaccid with 1/5 MMT at elbow extension, 2+/5 elbow flexion non functional use with shoulder subluxation (reports wearing sling at home)  LUE Sensation: WNL LUE Coordination: decreased fine motor;decreased gross motor    Lower Extremity Assessment Lower Extremity Assessment: Overall WFL for tasks assessed       Communication   Communication: No difficulties  Cognition Arousal/Alertness: Awake/alert Behavior During Therapy: WFL for tasks assessed/performed Overall Cognitive Status: History of cognitive impairments - at baseline Area of Impairment: Attention;Following commands;Safety/judgement;Awareness;Problem solving                   Current Attention Level: Selective   Following Commands: Follows multi-step commands inconsistently;Follows multi-step commands with increased time Safety/Judgement: Decreased awareness of safety;Decreased awareness of deficits Awareness: Emergent Problem Solving: Slow processing;Requires verbal cues        General Comments      Exercises     Assessment/Plan    PT Assessment Patient needs continued PT services  PT Problem List Decreased strength;Decreased range of motion;Decreased activity tolerance;Decreased balance;Decreased coordination;Decreased mobility;Decreased cognition;Decreased safety awareness       PT Treatment Interventions DME instruction;Gait training;Stair training;Functional mobility training;Therapeutic activities;Therapeutic exercise;Balance training;Patient/family education    PT Goals (Current goals can be found in the Care Plan section)  Acute Rehab PT Goals Patient Stated Goal: to get stronger PT Goal Formulation: With patient Time For Goal Achievement: 11/10/17 Potential to Achieve Goals: Good    Frequency Min 3X/week   Barriers to discharge Decreased caregiver  support      Co-evaluation PT/OT/SLP Co-Evaluation/Treatment: Yes Reason for Co-Treatment: For patient/therapist safety;To address functional/ADL transfers PT goals addressed during session: Mobility/safety with mobility OT goals addressed during session: ADL's and self-care;Other (comment)(transfers)       AM-PAC PT "6 Clicks" Daily Activity  Outcome Measure Difficulty turning over in bed (including adjusting bedclothes, sheets and blankets)?: None Difficulty moving from lying on back to sitting on the side of the bed? : A Little Difficulty sitting down on and standing up from a chair with arms (e.g., wheelchair, bedside commode, etc,.)?: Unable Help needed moving to and from a bed to chair (including a wheelchair)?: A Little Help needed walking in hospital room?: A Little Help needed climbing 3-5 steps with a railing? : A Lot 6 Click Score: 16    End of Session Equipment Utilized During Treatment: Gait belt Activity Tolerance: Patient tolerated treatment well Patient left: in chair;with call bell/phone within reach;with chair alarm set Nurse Communication: Mobility status PT Visit Diagnosis: Unsteadiness on feet (R26.81);Other abnormalities of gait and mobility (R26.89);Hemiplegia and hemiparesis;Difficulty in walking, not elsewhere classified (R26.2);Other symptoms and signs involving the nervous system (R29.898) Hemiplegia - Right/Left: Left Hemiplegia - dominant/non-dominant: Non-dominant    Time: 1610-9604 PT Time Calculation (min) (ACUTE ONLY): 19 min   Charges:   PT Evaluation $PT Eval Moderate Complexity: 1 Mod        Ellamae Sia, [place], DPT Acute Rehabilitation Services Pager [phone] Office [phone]   Willy Eddy 10/27/2017, 5:24 PM

## 2017-10-27 NOTE — Progress Notes (Signed)
PROGRESS NOTE  [name]  QJF:[id number] DOB: [birth year] DOA: 10/25/2017 PCP: Charlott Rakes, MD   Brief Narrative: [name] is a 67 y.o. male with a history of polysubstance abuse (EtOH, tobacco, cocaine), CAD, chronic HFpEF, PVD, OSA, HTN, HLD, T2DM, COPD, CVA with left-sided weakness s/p right ICA stent who presented with worsening left sided weakness, slurred speech, blurry vision found to have severely stenotic right ICA with watershed infarcts.   Assessment & Plan: Principal Problem:   CVA (cerebral vascular accident) ([place]) Active Problems:   COPD (chronic obstructive pulmonary disease) (HCC)   GERD (gastroesophageal reflux disease)   Gout   Chronic diastolic congestive heart failure (HCC)   CAD (coronary artery disease)   Alcohol abuse   Stroke ([place])   Essential hypertension   HLD (hyperlipidemia)   Tobacco use disorder   Dementia due to another medical condition   Depression  Multifocal right-sided watershed infarcts secondary to occluded right ICA stent involving right MCA/ACA and MCA/PCA: CTA head/neck with nearly occluded ICA stent on right with reconstitution. - Needs steady perfusion, so BP goal per neurology is 140-146mmHg.  - Continue DAPT - Continue statin, LDL 97.   - PT evaluation performed, recommending SNF. CSW consulted  COPD: Stable - Continue prn's  GERD: Stable - Continue PPI  Gout: No flare.  - Continue allopurinol  Chronic HFpEF: TTE 07/18/2016 showed EF 50-55% with grade 1 diastolic dysfunction. - Continue current treatment  CAD: No chest pain.  - Continue DAPT as above  Tobacco use, alcohol abuse, cocaine use: UDS +cocaine. - Cessation counseling provided by multiple providers during hospitalization  Dementia:  - Continue namenda  Depression:  - Continue cymbalta, requip  DVT prophylaxis: Lovenox Code Status: DNR Family Communication: None at bedside Disposition Plan: SNF once bed available.   Consultants:    Neurology  Subjective: Weakness is stable, feels much more weak than he had been PTA. No new deficits.   Objective: Vitals:   10/27/17 0332 10/27/17 0811 10/27/17 0854 10/27/17 1158  BP: (!) 182/87  (!) 176/90 (!) 167/64  Pulse: 82  84 89  Resp:   17 17  Temp: 98.2 F (36.8 C)  97.8 F (36.6 C) 98.6 F (37 C)  TempSrc: Oral  Oral Oral  SpO2: 96% 94% 97% 98%  Weight:      Height:        Intake/Output Summary (Last 24 hours) at 10/27/2017 1753 Last data filed at 10/27/2017 1700 Gross per 24 hour  Intake 1754.99 ml  Output 850 ml  Net 904.99 ml   Filed Weights   10/25/17 2110  Weight: 103.5 kg    Gen: 67 y.o. male in no distress  Pulm: Non-labored breathing room air. Clear to auscultation bilaterally.  CV: Regular rate and rhythm. No murmur, rub, or gallop. No JVD, no pedal edema. GI: Abdomen soft, non-tender, non-distended, with normoactive bowel sounds. No organomegaly or masses felt. Ext: Warm, no deformities Skin: No new rashes, lesions or ulcers Neuro: Alert and oriented. Left upper and lower extremities with 2-3/5 strength, intact sensation and coordination. No new focal neurological deficits. Psych: Judgement and insight appear fair. Mood & affect appropriate.   Data Reviewed: I have personally reviewed following labs and imaging studies  CBC: Recent Labs  Lab 10/25/17 2100 10/25/17 2105 10/27/17 0344  WBC 7.1  --  5.3  NEUTROABS 4.4  --   --   HGB 14.0 14.6 13.6  HCT 45.0 43.0 43.6  MCV 94.5  --  92.4  PLT 176  --  948   Basic Metabolic Panel: Recent Labs  Lab 10/25/17 2100 10/25/17 2105 10/27/17 0344  NA 142 142 144  K 4.2 4.2 4.0  CL 103 102 108  CO2 29  --  28  GLUCOSE 120* 116* 93  BUN 14 15 8   CREATININE 1.00 0.90 0.71  CALCIUM 9.5  --  9.7  MG  --   --  2.2   GFR: Estimated Creatinine Clearance: 106.5 mL/min (by C-G formula based on SCr of 0.71 mg/dL). Liver Function Tests: Recent Labs  Lab 10/25/17 2100 10/27/17 0344  AST  16 14*  ALT 14 14  ALKPHOS 71 62  BILITOT 0.4 1.0  PROT 6.2* 5.8*  ALBUMIN 3.7 3.2*   No results for input(s): LIPASE, AMYLASE in the last 168 hours. No results for input(s): AMMONIA in the last 168 hours. Coagulation Profile: Recent Labs  Lab 10/25/17 2100  INR 0.98   Cardiac Enzymes: No results for input(s): CKTOTAL, CKMB, CKMBINDEX, TROPONINI in the last 168 hours. BNP (last 3 results) No results for input(s): PROBNP in the last 8760 hours. HbA1C: Recent Labs    10/26/17 0322  HGBA1C 5.5   CBG: Recent Labs  Lab 10/25/17 2058  GLUCAP 111*   Lipid Profile: Recent Labs    10/26/17 0322  CHOL 161  HDL 43  LDLCALC 97  TRIG 103  CHOLHDL 3.7   Thyroid Function Tests: No results for input(s): TSH, T4TOTAL, FREET4, T3FREE, THYROIDAB in the last 72 hours. Anemia Panel: No results for input(s): VITAMINB12, FOLATE, FERRITIN, TIBC, IRON, RETICCTPCT in the last 72 hours. Urine analysis:    Component Value Date/Time   COLORURINE YELLOW 10/26/2017 0332   APPEARANCEUR CLEAR 10/26/2017 0332   LABSPEC >1.046 (H) 10/26/2017 0332   PHURINE 7.0 10/26/2017 0332   GLUCOSEU NEGATIVE 10/26/2017 0332   HGBUR NEGATIVE 10/26/2017 0332   BILIRUBINUR NEGATIVE 10/26/2017 0332   KETONESUR NEGATIVE 10/26/2017 0332   PROTEINUR NEGATIVE 10/26/2017 0332   UROBILINOGEN 0.2 08/19/2014 2103   NITRITE NEGATIVE 10/26/2017 0332   LEUKOCYTESUR NEGATIVE 10/26/2017 0332   No results found for this or any previous visit (from the past 240 hour(s)).    Radiology Studies: Ct Angio Head W Or Wo Contrast  Result Date: 10/25/2017 CLINICAL DATA:  Initial evaluation for acute left-sided deficits, right-sided gaze. EXAM: CT ANGIOGRAPHY HEAD AND NECK CT PERFUSION BRAIN TECHNIQUE: Multidetector CT imaging of the head and neck was performed using the standard protocol during bolus administration of intravenous contrast. Multiplanar CT image reconstructions and MIPs were obtained to evaluate the vascular  anatomy. Carotid stenosis measurements (when applicable) are obtained utilizing NASCET criteria, using the distal internal carotid diameter as the denominator. Multiphase CT imaging of the brain was performed following IV bolus contrast injection. Subsequent parametric perfusion maps were calculated using RAPID software. CONTRAST:  36mL ISOVUE-370 IOPAMIDOL (ISOVUE-370) INJECTION 76% COMPARISON:  Prior noncontrast head CT from earlier the same day as well as prior CTA from 07/18/2016 and arteriogram from 07/20/2016. FINDINGS: CTA NECK FINDINGS Aortic arch: Visualized aortic arch of normal caliber with normal branch pattern. Moderate atherosclerotic change about the visualized arch and origin of the great vessels with ulcerated soft plaque within the distal arch. Approximate 50% stenosis at the proximal right brachiocephalic artery just distal to its origin, similar to previous. Visualized subclavian arteries patent. Right carotid system: Right common carotid artery patent to the level of the distal right CCA stent. Stent appears patent proximally, but nearly entirely  occluded at its mid and distal aspect (series 5, image 70). Irregular attenuated flow is seen just distal to the stent, with subsequent occlusion (series 5, image 87). Right ICA otherwise occluded within the neck. Patent flow within the right external carotid artery and its branches. Left carotid system: Left common carotid artery patent from its origin to the bifurcation. Scattered multifocal plaque about the left bifurcation without hemodynamically significant stenosis. Left ICA patent distally to the skull base without stenosis, dissection, or occlusion. Incidental note made of left external carotid artery occlusion at its origin, with distal reconstitution via collateralization. Vertebral arteries: Both of the vertebral arteries arise from the subclavian arteries. Left vertebral artery dominant. Focal plaque at the origin of the vertebral arteries  bilaterally with approximate moderate proximal right V1 stenosis. More mild stenosis at the origin of the left vertebral artery. Scattered multifocal plaque within the V2 and V3 segments bilaterally without high-grade stenosis. Vertebral arteries are patent to the skull base. Skeleton: No acute osseus abnormality. No discrete lytic or blastic osseous lesions. Patient is edentulous. Mild-to-moderate multilevel cervical spondylolysis, stable. Other neck: No other acute soft tissue abnormality within the neck. Normal thyroid. No adenopathy. Upper chest: Visualized upper chest demonstrates no acute finding. Partially visualized lungs are clear. Review of the MIP images confirms the above findings CTA HEAD FINDINGS Anterior circulation: Right ICA occluded at the skull base. Distal reconstitution at the supraclinoid segment via flow across the circle-of-Willis. Cavernous to supraclinoid right ICA stent grossly patent distally, not well assessed proximally. Petrous left ICA widely patent. Scattered multifocal plaque within the cavernous left ICA with approximate moderate stenosis, grossly similar to previous. ICA termini patent. A1 segments patent bilaterally. Right A1 hypoplastic. Normal anterior communicating artery. Anterior cerebral arteries patent to their distal aspects without stenosis. Right A1 slightly hypoplastic. Right M1 widely patent. Normal right MCA bifurcation. No proximal right M2 occlusion. Distal right MCA branches well perfused. Left M1 widely patent. Normal left MCA bifurcation. No proximal left M2 occlusion. Distal left MCA branches well perfused. Posterior circulation: Vertebral arteries patent to the vertebrobasilar junction without stenosis. Left PICA patent. Right PICA not well seen. Basilar artery widely patent to its distal aspect. Superior cerebral arteries patent bilaterally. Both of the posterior cerebral arteries primarily supplied via the basilar and are widely patent to their distal  aspects without stenosis. Venous sinuses: Grossly patent. Anatomic variants: None significant. Delayed phase: Not performed. Review of the MIP images confirms the above findings CT Brain Perfusion Findings: CBF (<30%) Volume: 73mL Perfusion (Tmax>6.0s) volume: 241mL Mismatch Volume: 242mL Infarction Location:Scattered watershed type infarcts seen involving the right MCA/ACA territory as well as the MCA/PCA territory as well as the deep watershed cerebral white matter. Large surrounding ischemic penumbra involving the entirety of the right MCA territory related to delayed perfusion due to the occluded right ICA stent. IMPRESSION: 1. Occluded/nearly occluded proximal right ICA stent, with little to absent flow distally within the neck. Distal reconstitution at the right ICA terminus with grossly patent flow within the distal cavernous/supraclinoid right ICA stent. Right MCA is perfused without proximal or large vessel occlusion. 2. Acute watershed type infarcts involving the right MCA/ACA and MCA/PCA territories as well as the deep cerebral white matter. Large surrounding penumbra throughout the right MCA distribution due to delayed perfusion from the occluded right ICA stent. 3. Grossly patent distal cavernous/supraclinoid right ICA stent. Evaluation for possible intra stent stenosis difficult given proximal occlusion. 4. Atheromatous plaque within the cavernous left ICA with approximate 50% stenosis,  similar to previous. 5. Short-segment atheromatous stenosis of approximately 50% at the proximal innominate artery. 6. Moderate atheromatous stenoses of up to approximate 50% involving the proximal right V1 segment, with mild stenosis at the origin of the left vertebral artery. Otherwise patent vertebrobasilar system. 7. 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 These results were communicated to Dr. Leonel Ramsay at 9:57 pmon 9/16/2019by text page via the [facility]  messaging system. Electronically Signed   By: Jeannine Boga M.D.   On: 10/25/2017 22:10   Ct Angio Neck W Or Wo Contrast  Result Date: 10/25/2017 CLINICAL DATA:  Initial evaluation for acute left-sided deficits, right-sided gaze. EXAM: CT ANGIOGRAPHY HEAD AND NECK CT PERFUSION BRAIN TECHNIQUE: Multidetector CT imaging of the head and neck was performed using the standard protocol during bolus administration of intravenous contrast. Multiplanar CT image reconstructions and MIPs were obtained to evaluate the vascular anatomy. Carotid stenosis measurements (when applicable) are obtained utilizing NASCET criteria, using the distal internal carotid diameter as the denominator. Multiphase CT imaging of the brain was performed following IV bolus contrast injection. Subsequent parametric perfusion maps were calculated using RAPID software. CONTRAST:  1mL ISOVUE-370 IOPAMIDOL (ISOVUE-370) INJECTION 76% COMPARISON:  Prior noncontrast head CT from earlier the same day as well as prior CTA from 07/18/2016 and arteriogram from 07/20/2016. FINDINGS: CTA NECK FINDINGS Aortic arch: Visualized aortic arch of normal caliber with normal branch pattern. Moderate atherosclerotic change about the visualized arch and origin of the great vessels with ulcerated soft plaque within the distal arch. Approximate 50% stenosis at the proximal right brachiocephalic artery just distal to its origin, similar to previous. Visualized subclavian arteries patent. Right carotid system: Right common carotid artery patent to the level of the distal right CCA stent. Stent appears patent proximally, but nearly entirely occluded at its mid and distal aspect (series 5, image 70). Irregular attenuated flow is seen just distal to the stent, with subsequent occlusion (series 5, image 87). Right ICA otherwise occluded within the neck. Patent flow within the right external carotid artery and its branches. Left carotid system: Left common carotid artery  patent from its origin to the bifurcation. Scattered multifocal plaque about the left bifurcation without hemodynamically significant stenosis. Left ICA patent distally to the skull base without stenosis, dissection, or occlusion. Incidental note made of left external carotid artery occlusion at its origin, with distal reconstitution via collateralization. Vertebral arteries: Both of the vertebral arteries arise from the subclavian arteries. Left vertebral artery dominant. Focal plaque at the origin of the vertebral arteries bilaterally with approximate moderate proximal right V1 stenosis. More mild stenosis at the origin of the left vertebral artery. Scattered multifocal plaque within the V2 and V3 segments bilaterally without high-grade stenosis. Vertebral arteries are patent to the skull base. Skeleton: No acute osseus abnormality. No discrete lytic or blastic osseous lesions. Patient is edentulous. Mild-to-moderate multilevel cervical spondylolysis, stable. Other neck: No other acute soft tissue abnormality within the neck. Normal thyroid. No adenopathy. Upper chest: Visualized upper chest demonstrates no acute finding. Partially visualized lungs are clear. Review of the MIP images confirms the above findings CTA HEAD FINDINGS Anterior circulation: Right ICA occluded at the skull base. Distal reconstitution at the supraclinoid segment via flow across the circle-of-Willis. Cavernous to supraclinoid right ICA stent grossly patent distally, not well assessed proximally. Petrous left ICA widely patent. Scattered multifocal plaque within the cavernous left ICA with approximate moderate stenosis, grossly similar to previous. ICA termini patent. A1 segments  patent bilaterally. Right A1 hypoplastic. Normal anterior communicating artery. Anterior cerebral arteries patent to their distal aspects without stenosis. Right A1 slightly hypoplastic. Right M1 widely patent. Normal right MCA bifurcation. No proximal right M2  occlusion. Distal right MCA branches well perfused. Left M1 widely patent. Normal left MCA bifurcation. No proximal left M2 occlusion. Distal left MCA branches well perfused. Posterior circulation: Vertebral arteries patent to the vertebrobasilar junction without stenosis. Left PICA patent. Right PICA not well seen. Basilar artery widely patent to its distal aspect. Superior cerebral arteries patent bilaterally. Both of the posterior cerebral arteries primarily supplied via the basilar and are widely patent to their distal aspects without stenosis. Venous sinuses: Grossly patent. Anatomic variants: None significant. Delayed phase: Not performed. Review of the MIP images confirms the above findings CT Brain Perfusion Findings: CBF (<30%) Volume: 2mL Perfusion (Tmax>6.0s) volume: 213mL Mismatch Volume: 260mL Infarction Location:Scattered watershed type infarcts seen involving the right MCA/ACA territory as well as the MCA/PCA territory as well as the deep watershed cerebral white matter. Large surrounding ischemic penumbra involving the entirety of the right MCA territory related to delayed perfusion due to the occluded right ICA stent. IMPRESSION: 1. Occluded/nearly occluded proximal right ICA stent, with little to absent flow distally within the neck. Distal reconstitution at the right ICA terminus with grossly patent flow within the distal cavernous/supraclinoid right ICA stent. Right MCA is perfused without proximal or large vessel occlusion. 2. Acute watershed type infarcts involving the right MCA/ACA and MCA/PCA territories as well as the deep cerebral white matter. Large surrounding penumbra throughout the right MCA distribution due to delayed perfusion from the occluded right ICA stent. 3. Grossly patent distal cavernous/supraclinoid right ICA stent. Evaluation for possible intra stent stenosis difficult given proximal occlusion. 4. Atheromatous plaque within the cavernous left ICA with approximate 50%  stenosis, similar to previous. 5. Short-segment atheromatous stenosis of approximately 50% at the proximal innominate artery. 6. Moderate atheromatous stenoses of up to approximate 50% involving the proximal right V1 segment, with mild stenosis at the origin of the left vertebral artery. Otherwise patent vertebrobasilar system. 7. 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 These results were communicated to Dr. Leonel Ramsay at 9:57 pmon 9/16/2019by text page via the [facility] messaging system. Electronically Signed   By: Jeannine Boga M.D.   On: 10/25/2017 22:10   Mr Brain Wo Contrast  Result Date: 10/26/2017 CLINICAL DATA:  Follow-up examination for acute stroke. EXAM: MRI HEAD WITHOUT CONTRAST TECHNIQUE: Multiplanar, multiecho pulse sequences of the brain and surrounding structures were obtained without intravenous contrast. COMPARISON:  Prior CTA from 10/25/2017. FINDINGS: Brain: Examination moderately degraded by motion artifact. Generalized age-related cerebral atrophy. Patchy T2/FLAIR hyperintensity within the periventricular and deep white matter both cerebral hemispheres most consistent with chronic small vessel ischemic disease, mild to moderate nature. Scattered areas of encephalomalacia involving the right cerebral hemisphere, consistent with chronic right MCA territory infarcts, mostly watershed in nature. Scattered areas of restricted diffusion seen involving the cortical gray matter within the right parietal lobe (series 5, image 65, posterior right temporal region (series 5, image 56), and right insula (series 5, image 57), consistent with acute ischemic changes. Minimal punctate cortical involvement at the posterior right frontal region (series 5, image 68). No associated hemorrhage. Changes involve right MCA and MCA watershed zones, likely due to known right ICA stent occlusion. Additionally, there is mild diffusion abnormality involving  the anterior inferior right cingulate (series 5, image 61), likely related  to hypoperfusion of the right ACA due to stent occlusion. No associated hemorrhage. No other evidence for acute or subacute ischemia. Gray-white matter differentiation otherwise maintained. No evidence for acute intracranial hemorrhage. No mass lesion or midline shift. No hydrocephalus. No extra-axial fluid collection. Incidental note made of a partially empty sella. Vascular: Abnormal flow void within the right ICA to the level of the supraclinoid segment, consistent with known occlusion. Major intravascular flow voids otherwise maintained. Skull and upper cervical spine: Craniocervical junction normal. No focal marrow replacing lesion. Scalp soft tissues demonstrate no acute finding. Sinuses/Orbits: Patient status post ocular lens replacement bilaterally. Chronic left maxillary sinusitis. No significant mastoid effusion. Inner ear structures normal. Other: None. IMPRESSION: 1. Scattered areas of acute cortical ischemia involving the right MCA territory and right MCA watershed zones, most likely due to acute occlusion of proximal right ICA stent as seen on prior CTA. No associated hemorrhage. 2. Additional mild right ACA ischemic changes involving the anterior inferior right cingulate, likely due to hypoperfusion given underlying hypoplastic right ACA in the setting of stent occlusion. 3. Abnormal flow void within the right ICA to the level of the supraclinoid segment, consistent with previously identified occlusion. 4. Underlying chronic watershed right MCA territory infarcts with mild to moderate chronic small vessel ischemic disease. Electronically Signed   By: Jeannine Boga M.D.   On: 10/26/2017 01:47   Ct Cerebral Perfusion W Contrast  Result Date: 10/25/2017 CLINICAL DATA:  Initial evaluation for acute left-sided deficits, right-sided gaze. EXAM: CT ANGIOGRAPHY HEAD AND NECK CT PERFUSION BRAIN TECHNIQUE: Multidetector CT  imaging of the head and neck was performed using the standard protocol during bolus administration of intravenous contrast. Multiplanar CT image reconstructions and MIPs were obtained to evaluate the vascular anatomy. Carotid stenosis measurements (when applicable) are obtained utilizing NASCET criteria, using the distal internal carotid diameter as the denominator. Multiphase CT imaging of the brain was performed following IV bolus contrast injection. Subsequent parametric perfusion maps were calculated using RAPID software. CONTRAST:  73mL ISOVUE-370 IOPAMIDOL (ISOVUE-370) INJECTION 76% COMPARISON:  Prior noncontrast head CT from earlier the same day as well as prior CTA from 07/18/2016 and arteriogram from 07/20/2016. FINDINGS: CTA NECK FINDINGS Aortic arch: Visualized aortic arch of normal caliber with normal branch pattern. Moderate atherosclerotic change about the visualized arch and origin of the great vessels with ulcerated soft plaque within the distal arch. Approximate 50% stenosis at the proximal right brachiocephalic artery just distal to its origin, similar to previous. Visualized subclavian arteries patent. Right carotid system: Right common carotid artery patent to the level of the distal right CCA stent. Stent appears patent proximally, but nearly entirely occluded at its mid and distal aspect (series 5, image 70). Irregular attenuated flow is seen just distal to the stent, with subsequent occlusion (series 5, image 87). Right ICA otherwise occluded within the neck. Patent flow within the right external carotid artery and its branches. Left carotid system: Left common carotid artery patent from its origin to the bifurcation. Scattered multifocal plaque about the left bifurcation without hemodynamically significant stenosis. Left ICA patent distally to the skull base without stenosis, dissection, or occlusion. Incidental note made of left external carotid artery occlusion at its origin, with distal  reconstitution via collateralization. Vertebral arteries: Both of the vertebral arteries arise from the subclavian arteries. Left vertebral artery dominant. Focal plaque at the origin of the vertebral arteries bilaterally with approximate moderate proximal right V1 stenosis. More mild stenosis at the origin of the left vertebral artery.  Scattered multifocal plaque within the V2 and V3 segments bilaterally without high-grade stenosis. Vertebral arteries are patent to the skull base. Skeleton: No acute osseus abnormality. No discrete lytic or blastic osseous lesions. Patient is edentulous. Mild-to-moderate multilevel cervical spondylolysis, stable. Other neck: No other acute soft tissue abnormality within the neck. Normal thyroid. No adenopathy. Upper chest: Visualized upper chest demonstrates no acute finding. Partially visualized lungs are clear. Review of the MIP images confirms the above findings CTA HEAD FINDINGS Anterior circulation: Right ICA occluded at the skull base. Distal reconstitution at the supraclinoid segment via flow across the circle-of-Willis. Cavernous to supraclinoid right ICA stent grossly patent distally, not well assessed proximally. Petrous left ICA widely patent. Scattered multifocal plaque within the cavernous left ICA with approximate moderate stenosis, grossly similar to previous. ICA termini patent. A1 segments patent bilaterally. Right A1 hypoplastic. Normal anterior communicating artery. Anterior cerebral arteries patent to their distal aspects without stenosis. Right A1 slightly hypoplastic. Right M1 widely patent. Normal right MCA bifurcation. No proximal right M2 occlusion. Distal right MCA branches well perfused. Left M1 widely patent. Normal left MCA bifurcation. No proximal left M2 occlusion. Distal left MCA branches well perfused. Posterior circulation: Vertebral arteries patent to the vertebrobasilar junction without stenosis. Left PICA patent. Right PICA not well seen. Basilar  artery widely patent to its distal aspect. Superior cerebral arteries patent bilaterally. Both of the posterior cerebral arteries primarily supplied via the basilar and are widely patent to their distal aspects without stenosis. Venous sinuses: Grossly patent. Anatomic variants: None significant. Delayed phase: Not performed. Review of the MIP images confirms the above findings CT Brain Perfusion Findings: CBF (<30%) Volume: 58mL Perfusion (Tmax>6.0s) volume: 256mL Mismatch Volume: 238mL Infarction Location:Scattered watershed type infarcts seen involving the right MCA/ACA territory as well as the MCA/PCA territory as well as the deep watershed cerebral white matter. Large surrounding ischemic penumbra involving the entirety of the right MCA territory related to delayed perfusion due to the occluded right ICA stent. IMPRESSION: 1. Occluded/nearly occluded proximal right ICA stent, with little to absent flow distally within the neck. Distal reconstitution at the right ICA terminus with grossly patent flow within the distal cavernous/supraclinoid right ICA stent. Right MCA is perfused without proximal or large vessel occlusion. 2. Acute watershed type infarcts involving the right MCA/ACA and MCA/PCA territories as well as the deep cerebral white matter. Large surrounding penumbra throughout the right MCA distribution due to delayed perfusion from the occluded right ICA stent. 3. Grossly patent distal cavernous/supraclinoid right ICA stent. Evaluation for possible intra stent stenosis difficult given proximal occlusion. 4. Atheromatous plaque within the cavernous left ICA with approximate 50% stenosis, similar to previous. 5. Short-segment atheromatous stenosis of approximately 50% at the proximal innominate artery. 6. Moderate atheromatous stenoses of up to approximate 50% involving the proximal right V1 segment, with mild stenosis at the origin of the left vertebral artery. Otherwise patent vertebrobasilar system. 7.  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 These results were communicated to Dr. Leonel Ramsay at 9:57 pmon 9/16/2019by text page via the [facility] messaging system. Electronically Signed   By: Jeannine Boga M.D.   On: 10/25/2017 22:10   Ct Head Code Stroke Wo Contrast  Result Date: 10/25/2017 CLINICAL DATA:  Code stroke. Right-sided gaze, left-sided neurologic deficits. EXAM: CT HEAD WITHOUT CONTRAST TECHNIQUE: Contiguous axial images were obtained from the base of the skull through the vertex without intravenous contrast. COMPARISON:  Head CT 09/19/2017 FINDINGS: Brain: There  is no mass, hemorrhage or extra-axial collection. The size and configuration of the ventricles and extra-axial CSF spaces are normal. There are 2 subcortical old right frontal lobe infarcts, unchanged. There is hypoattenuation of the periventricular white matter, most commonly indicating chronic ischemic microangiopathy. Vascular: No abnormal hyperdensity of the major intracranial arteries or dural venous sinuses. No intracranial atherosclerosis. Skull: The visualized skull base, calvarium and extracranial soft tissues are normal. Sinuses/Orbits: No fluid levels or advanced mucosal thickening of the visualized paranasal sinuses. No mastoid or middle ear effusion. The orbits are normal. ASPECTS [facility] Stroke Program Early CT Score) - Ganglionic level infarction (caudate, lentiform nuclei, internal capsule, insula, M1-M3 cortex): 7 - Supraganglionic infarction (M4-M6 cortex): 3 Total score (0-10 with 10 being normal): 10 IMPRESSION: 1. No acute hemorrhage. 2. ASPECTS is 10. 3. Old subcortical infarcts within the right frontal lobe, unchanged compared to 09/19/2017. These results were communicated to Dr. Roland Rack at 9:13 pm on 10/25/2017 by text page via the [facility] messaging system. Electronically Signed   By: Ulyses Jarred M.D.   On: 10/25/2017 21:13    Scheduled  Meds: .  stroke: mapping our early stages of recovery book   Does not apply Once  . allopurinol  300 mg Oral Daily  . aspirin  300 mg Rectal Daily   Or  . aspirin  325 mg Oral Daily  . atorvastatin  80 mg Oral q1800  . clopidogrel  75 mg Oral Daily  . DULoxetine  60 mg Oral Daily  . enoxaparin (LOVENOX) injection  40 mg Subcutaneous Daily  . folic acid  1 mg Oral Daily  . LORazepam  0-4 mg Intravenous Q6H   Followed by  . [START ON 10/28/2017] LORazepam  0-4 mg Intravenous Q12H  . memantine  10 mg Oral BID  . mometasone-formoterol  2 puff Inhalation BID  . multivitamin with minerals  1 tablet Oral Daily  . nicotine  21 mg Transdermal Daily  . pantoprazole  40 mg Oral q morning - 10a  . polyethylene glycol  17 g Oral Daily  . pregabalin  150 mg Oral BID  . rOPINIRole  0.5 mg Oral QHS  . thiamine  100 mg Oral Daily   Or  . thiamine  100 mg Intravenous Daily   Continuous Infusions: . sodium chloride 75 mL/hr at 10/27/17 0813     LOS: 2 days   Time spent: 25 minutes.  Patrecia Pour, MD [facility] [email or web address] Password [facility] 10/27/2017, 5:53 PM

## 2017-10-27 NOTE — Progress Notes (Signed)
STROKE TEAM PROGRESS NOTE    INTERVAL HISTORY Patient lying comfortably in bed. No complaints  Vitals:   10/27/17 0332 10/27/17 0811 10/27/17 0854 10/27/17 1158  BP: (!) 182/87  (!) 176/90 (!) 167/64  Pulse: 82  84 89  Resp:   17 17  Temp: 98.2 F (36.8 C)  97.8 F (36.6 C) 98.6 F (37 C)  TempSrc: Oral  Oral Oral  SpO2: 96% 94% 97% 98%  Weight:      Height:        CBC:  Recent Labs  Lab 10/25/17 2100 10/25/17 2105 10/27/17 0344  WBC 7.1  --  5.3  NEUTROABS 4.4  --   --   HGB 14.0 14.6 13.6  HCT 45.0 43.0 43.6  MCV 94.5  --  92.4  PLT 176  --  829    Basic Metabolic Panel:  Recent Labs  Lab 10/25/17 2100 10/25/17 2105 10/27/17 0344  NA 142 142 144  K 4.2 4.2 4.0  CL 103 102 108  CO2 29  --  28  GLUCOSE 120* 116* 93  BUN 14 15 8   CREATININE 1.00 0.90 0.71  CALCIUM 9.5  --  9.7  MG  --   --  2.2   Lipid Panel:     Component Value Date/Time   CHOL 161 10/26/2017 0322   TRIG 103 10/26/2017 0322   HDL 43 10/26/2017 0322   CHOLHDL 3.7 10/26/2017 0322   VLDL 21 10/26/2017 0322   LDLCALC 97 10/26/2017 0322   HgbA1c:  Lab Results  Component Value Date   HGBA1C 5.5 10/26/2017   Urine Drug Screen:     Component Value Date/Time   LABOPIA NONE DETECTED 10/26/2017 0332   COCAINSCRNUR POSITIVE (A) 10/26/2017 0332   LABBENZ NONE DETECTED 10/26/2017 0332   AMPHETMU NONE DETECTED 10/26/2017 0332   THCU NONE DETECTED 10/26/2017 0332   LABBARB NONE DETECTED 10/26/2017 0332    Alcohol Level     Component Value Date/Time   ETH <10 10/25/2017 2100    IMAGING Ct Angio Head W Or Wo Contrast  Result Date: 10/25/2017 CLINICAL DATA:  Initial evaluation for acute left-sided deficits, right-sided gaze. EXAM: CT ANGIOGRAPHY HEAD AND NECK CT PERFUSION BRAIN TECHNIQUE: Multidetector CT imaging of the head and neck was performed using the standard protocol during bolus administration of intravenous contrast. Multiplanar CT image reconstructions and MIPs were obtained  to evaluate the vascular anatomy. Carotid stenosis measurements (when applicable) are obtained utilizing NASCET criteria, using the distal internal carotid diameter as the denominator. Multiphase CT imaging of the brain was performed following IV bolus contrast injection. Subsequent parametric perfusion maps were calculated using RAPID software. CONTRAST:  55mL ISOVUE-370 IOPAMIDOL (ISOVUE-370) INJECTION 76% COMPARISON:  Prior noncontrast head CT from earlier the same day as well as prior CTA from 07/18/2016 and arteriogram from 07/20/2016. FINDINGS: CTA NECK FINDINGS Aortic arch: Visualized aortic arch of normal caliber with normal branch pattern. Moderate atherosclerotic change about the visualized arch and origin of the great vessels with ulcerated soft plaque within the distal arch. Approximate 50% stenosis at the proximal right brachiocephalic artery just distal to its origin, similar to previous. Visualized subclavian arteries patent. Right carotid system: Right common carotid artery patent to the level of the distal right CCA stent. Stent appears patent proximally, but nearly entirely occluded at its mid and distal aspect (series 5, image 70). Irregular attenuated flow is seen just distal to the stent, with subsequent occlusion (series 5, image 87). Right ICA  otherwise occluded within the neck. Patent flow within the right external carotid artery and its branches. Left carotid system: Left common carotid artery patent from its origin to the bifurcation. Scattered multifocal plaque about the left bifurcation without hemodynamically significant stenosis. Left ICA patent distally to the skull base without stenosis, dissection, or occlusion. Incidental note made of left external carotid artery occlusion at its origin, with distal reconstitution via collateralization. Vertebral arteries: Both of the vertebral arteries arise from the subclavian arteries. Left vertebral artery dominant. Focal plaque at the origin of  the vertebral arteries bilaterally with approximate moderate proximal right V1 stenosis. More mild stenosis at the origin of the left vertebral artery. Scattered multifocal plaque within the V2 and V3 segments bilaterally without high-grade stenosis. Vertebral arteries are patent to the skull base. Skeleton: No acute osseus abnormality. No discrete lytic or blastic osseous lesions. Patient is edentulous. Mild-to-moderate multilevel cervical spondylolysis, stable. Other neck: No other acute soft tissue abnormality within the neck. Normal thyroid. No adenopathy. Upper chest: Visualized upper chest demonstrates no acute finding. Partially visualized lungs are clear. Review of the MIP images confirms the above findings CTA HEAD FINDINGS Anterior circulation: Right ICA occluded at the skull base. Distal reconstitution at the supraclinoid segment via flow across the circle-of-Willis. Cavernous to supraclinoid right ICA stent grossly patent distally, not well assessed proximally. Petrous left ICA widely patent. Scattered multifocal plaque within the cavernous left ICA with approximate moderate stenosis, grossly similar to previous. ICA termini patent. A1 segments patent bilaterally. Right A1 hypoplastic. Normal anterior communicating artery. Anterior cerebral arteries patent to their distal aspects without stenosis. Right A1 slightly hypoplastic. Right M1 widely patent. Normal right MCA bifurcation. No proximal right M2 occlusion. Distal right MCA branches well perfused. Left M1 widely patent. Normal left MCA bifurcation. No proximal left M2 occlusion. Distal left MCA branches well perfused. Posterior circulation: Vertebral arteries patent to the vertebrobasilar junction without stenosis. Left PICA patent. Right PICA not well seen. Basilar artery widely patent to its distal aspect. Superior cerebral arteries patent bilaterally. Both of the posterior cerebral arteries primarily supplied via the basilar and are widely patent  to their distal aspects without stenosis. Venous sinuses: Grossly patent. Anatomic variants: None significant. Delayed phase: Not performed. Review of the MIP images confirms the above findings CT Brain Perfusion Findings: CBF (<30%) Volume: 39mL Perfusion (Tmax>6.0s) volume: 221mL Mismatch Volume: 264mL Infarction Location:Scattered watershed type infarcts seen involving the right MCA/ACA territory as well as the MCA/PCA territory as well as the deep watershed cerebral white matter. Large surrounding ischemic penumbra involving the entirety of the right MCA territory related to delayed perfusion due to the occluded right ICA stent. IMPRESSION: 1. Occluded/nearly occluded proximal right ICA stent, with little to absent flow distally within the neck. Distal reconstitution at the right ICA terminus with grossly patent flow within the distal cavernous/supraclinoid right ICA stent. Right MCA is perfused without proximal or large vessel occlusion. 2. Acute watershed type infarcts involving the right MCA/ACA and MCA/PCA territories as well as the deep cerebral white matter. Large surrounding penumbra throughout the right MCA distribution due to delayed perfusion from the occluded right ICA stent. 3. Grossly patent distal cavernous/supraclinoid right ICA stent. Evaluation for possible intra stent stenosis difficult given proximal occlusion. 4. Atheromatous plaque within the cavernous left ICA with approximate 50% stenosis, similar to previous. 5. Short-segment atheromatous stenosis of approximately 50% at the proximal innominate artery. 6. Moderate atheromatous stenoses of up to approximate 50% involving the proximal right V1 segment,  with mild stenosis at the origin of the left vertebral artery. Otherwise patent vertebrobasilar system. 7. 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 These results were communicated to Dr. Leonel Ramsay at 9:57 pmon 9/16/2019by text page  via the [facility] messaging system. Electronically Signed   By: Jeannine Boga M.D.   On: 10/25/2017 22:10   Ct Angio Neck W Or Wo Contrast  Result Date: 10/25/2017 CLINICAL DATA:  Initial evaluation for acute left-sided deficits, right-sided gaze. EXAM: CT ANGIOGRAPHY HEAD AND NECK CT PERFUSION BRAIN TECHNIQUE: Multidetector CT imaging of the head and neck was performed using the standard protocol during bolus administration of intravenous contrast. Multiplanar CT image reconstructions and MIPs were obtained to evaluate the vascular anatomy. Carotid stenosis measurements (when applicable) are obtained utilizing NASCET criteria, using the distal internal carotid diameter as the denominator. Multiphase CT imaging of the brain was performed following IV bolus contrast injection. Subsequent parametric perfusion maps were calculated using RAPID software. CONTRAST:  15mL ISOVUE-370 IOPAMIDOL (ISOVUE-370) INJECTION 76% COMPARISON:  Prior noncontrast head CT from earlier the same day as well as prior CTA from 07/18/2016 and arteriogram from 07/20/2016. FINDINGS: CTA NECK FINDINGS Aortic arch: Visualized aortic arch of normal caliber with normal branch pattern. Moderate atherosclerotic change about the visualized arch and origin of the great vessels with ulcerated soft plaque within the distal arch. Approximate 50% stenosis at the proximal right brachiocephalic artery just distal to its origin, similar to previous. Visualized subclavian arteries patent. Right carotid system: Right common carotid artery patent to the level of the distal right CCA stent. Stent appears patent proximally, but nearly entirely occluded at its mid and distal aspect (series 5, image 70). Irregular attenuated flow is seen just distal to the stent, with subsequent occlusion (series 5, image 87). Right ICA otherwise occluded within the neck. Patent flow within the right external carotid artery and its branches. Left carotid system: Left common  carotid artery patent from its origin to the bifurcation. Scattered multifocal plaque about the left bifurcation without hemodynamically significant stenosis. Left ICA patent distally to the skull base without stenosis, dissection, or occlusion. Incidental note made of left external carotid artery occlusion at its origin, with distal reconstitution via collateralization. Vertebral arteries: Both of the vertebral arteries arise from the subclavian arteries. Left vertebral artery dominant. Focal plaque at the origin of the vertebral arteries bilaterally with approximate moderate proximal right V1 stenosis. More mild stenosis at the origin of the left vertebral artery. Scattered multifocal plaque within the V2 and V3 segments bilaterally without high-grade stenosis. Vertebral arteries are patent to the skull base. Skeleton: No acute osseus abnormality. No discrete lytic or blastic osseous lesions. Patient is edentulous. Mild-to-moderate multilevel cervical spondylolysis, stable. Other neck: No other acute soft tissue abnormality within the neck. Normal thyroid. No adenopathy. Upper chest: Visualized upper chest demonstrates no acute finding. Partially visualized lungs are clear. Review of the MIP images confirms the above findings CTA HEAD FINDINGS Anterior circulation: Right ICA occluded at the skull base. Distal reconstitution at the supraclinoid segment via flow across the circle-of-Willis. Cavernous to supraclinoid right ICA stent grossly patent distally, not well assessed proximally. Petrous left ICA widely patent. Scattered multifocal plaque within the cavernous left ICA with approximate moderate stenosis, grossly similar to previous. ICA termini patent. A1 segments patent bilaterally. Right A1 hypoplastic. Normal anterior communicating artery. Anterior cerebral arteries patent to their distal aspects without stenosis. Right A1 slightly hypoplastic. Right M1 widely patent. Normal right MCA  bifurcation. No  proximal right M2 occlusion. Distal right MCA branches well perfused. Left M1 widely patent. Normal left MCA bifurcation. No proximal left M2 occlusion. Distal left MCA branches well perfused. Posterior circulation: Vertebral arteries patent to the vertebrobasilar junction without stenosis. Left PICA patent. Right PICA not well seen. Basilar artery widely patent to its distal aspect. Superior cerebral arteries patent bilaterally. Both of the posterior cerebral arteries primarily supplied via the basilar and are widely patent to their distal aspects without stenosis. Venous sinuses: Grossly patent. Anatomic variants: None significant. Delayed phase: Not performed. Review of the MIP images confirms the above findings CT Brain Perfusion Findings: CBF (<30%) Volume: 58mL Perfusion (Tmax>6.0s) volume: 239mL Mismatch Volume: 280mL Infarction Location:Scattered watershed type infarcts seen involving the right MCA/ACA territory as well as the MCA/PCA territory as well as the deep watershed cerebral white matter. Large surrounding ischemic penumbra involving the entirety of the right MCA territory related to delayed perfusion due to the occluded right ICA stent. IMPRESSION: 1. Occluded/nearly occluded proximal right ICA stent, with little to absent flow distally within the neck. Distal reconstitution at the right ICA terminus with grossly patent flow within the distal cavernous/supraclinoid right ICA stent. Right MCA is perfused without proximal or large vessel occlusion. 2. Acute watershed type infarcts involving the right MCA/ACA and MCA/PCA territories as well as the deep cerebral white matter. Large surrounding penumbra throughout the right MCA distribution due to delayed perfusion from the occluded right ICA stent. 3. Grossly patent distal cavernous/supraclinoid right ICA stent. Evaluation for possible intra stent stenosis difficult given proximal occlusion. 4. Atheromatous plaque within the cavernous left ICA with  approximate 50% stenosis, similar to previous. 5. Short-segment atheromatous stenosis of approximately 50% at the proximal innominate artery. 6. Moderate atheromatous stenoses of up to approximate 50% involving the proximal right V1 segment, with mild stenosis at the origin of the left vertebral artery. Otherwise patent vertebrobasilar system. 7. 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 These results were communicated to Dr. Leonel Ramsay at 9:57 pmon 9/16/2019by text page via the [facility] messaging system. Electronically Signed   By: Jeannine Boga M.D.   On: 10/25/2017 22:10   Mr Brain Wo Contrast  Result Date: 10/26/2017 CLINICAL DATA:  Follow-up examination for acute stroke. EXAM: MRI HEAD WITHOUT CONTRAST TECHNIQUE: Multiplanar, multiecho pulse sequences of the brain and surrounding structures were obtained without intravenous contrast. COMPARISON:  Prior CTA from 10/25/2017. FINDINGS: Brain: Examination moderately degraded by motion artifact. Generalized age-related cerebral atrophy. Patchy T2/FLAIR hyperintensity within the periventricular and deep white matter both cerebral hemispheres most consistent with chronic small vessel ischemic disease, mild to moderate nature. Scattered areas of encephalomalacia involving the right cerebral hemisphere, consistent with chronic right MCA territory infarcts, mostly watershed in nature. Scattered areas of restricted diffusion seen involving the cortical gray matter within the right parietal lobe (series 5, image 65, posterior right temporal region (series 5, image 56), and right insula (series 5, image 57), consistent with acute ischemic changes. Minimal punctate cortical involvement at the posterior right frontal region (series 5, image 68). No associated hemorrhage. Changes involve right MCA and MCA watershed zones, likely due to known right ICA stent occlusion. Additionally, there is mild diffusion  abnormality involving the anterior inferior right cingulate (series 5, image 61), likely related to hypoperfusion of the right ACA due to stent occlusion. No associated hemorrhage. No other evidence for acute or subacute ischemia. Gray-white matter differentiation otherwise maintained. No evidence for acute  intracranial hemorrhage. No mass lesion or midline shift. No hydrocephalus. No extra-axial fluid collection. Incidental note made of a partially empty sella. Vascular: Abnormal flow void within the right ICA to the level of the supraclinoid segment, consistent with known occlusion. Major intravascular flow voids otherwise maintained. Skull and upper cervical spine: Craniocervical junction normal. No focal marrow replacing lesion. Scalp soft tissues demonstrate no acute finding. Sinuses/Orbits: Patient status post ocular lens replacement bilaterally. Chronic left maxillary sinusitis. No significant mastoid effusion. Inner ear structures normal. Other: None. IMPRESSION: 1. Scattered areas of acute cortical ischemia involving the right MCA territory and right MCA watershed zones, most likely due to acute occlusion of proximal right ICA stent as seen on prior CTA. No associated hemorrhage. 2. Additional mild right ACA ischemic changes involving the anterior inferior right cingulate, likely due to hypoperfusion given underlying hypoplastic right ACA in the setting of stent occlusion. 3. Abnormal flow void within the right ICA to the level of the supraclinoid segment, consistent with previously identified occlusion. 4. Underlying chronic watershed right MCA territory infarcts with mild to moderate chronic small vessel ischemic disease. Electronically Signed   By: Jeannine Boga M.D.   On: 10/26/2017 01:47   Ct Cerebral Perfusion W Contrast  Result Date: 10/25/2017 CLINICAL DATA:  Initial evaluation for acute left-sided deficits, right-sided gaze. EXAM: CT ANGIOGRAPHY HEAD AND NECK CT PERFUSION BRAIN  TECHNIQUE: Multidetector CT imaging of the head and neck was performed using the standard protocol during bolus administration of intravenous contrast. Multiplanar CT image reconstructions and MIPs were obtained to evaluate the vascular anatomy. Carotid stenosis measurements (when applicable) are obtained utilizing NASCET criteria, using the distal internal carotid diameter as the denominator. Multiphase CT imaging of the brain was performed following IV bolus contrast injection. Subsequent parametric perfusion maps were calculated using RAPID software. CONTRAST:  82mL ISOVUE-370 IOPAMIDOL (ISOVUE-370) INJECTION 76% COMPARISON:  Prior noncontrast head CT from earlier the same day as well as prior CTA from 07/18/2016 and arteriogram from 07/20/2016. FINDINGS: CTA NECK FINDINGS Aortic arch: Visualized aortic arch of normal caliber with normal branch pattern. Moderate atherosclerotic change about the visualized arch and origin of the great vessels with ulcerated soft plaque within the distal arch. Approximate 50% stenosis at the proximal right brachiocephalic artery just distal to its origin, similar to previous. Visualized subclavian arteries patent. Right carotid system: Right common carotid artery patent to the level of the distal right CCA stent. Stent appears patent proximally, but nearly entirely occluded at its mid and distal aspect (series 5, image 70). Irregular attenuated flow is seen just distal to the stent, with subsequent occlusion (series 5, image 87). Right ICA otherwise occluded within the neck. Patent flow within the right external carotid artery and its branches. Left carotid system: Left common carotid artery patent from its origin to the bifurcation. Scattered multifocal plaque about the left bifurcation without hemodynamically significant stenosis. Left ICA patent distally to the skull base without stenosis, dissection, or occlusion. Incidental note made of left external carotid artery occlusion  at its origin, with distal reconstitution via collateralization. Vertebral arteries: Both of the vertebral arteries arise from the subclavian arteries. Left vertebral artery dominant. Focal plaque at the origin of the vertebral arteries bilaterally with approximate moderate proximal right V1 stenosis. More mild stenosis at the origin of the left vertebral artery. Scattered multifocal plaque within the V2 and V3 segments bilaterally without high-grade stenosis. Vertebral arteries are patent to the skull base. Skeleton: No acute osseus abnormality. No discrete lytic or  blastic osseous lesions. Patient is edentulous. Mild-to-moderate multilevel cervical spondylolysis, stable. Other neck: No other acute soft tissue abnormality within the neck. Normal thyroid. No adenopathy. Upper chest: Visualized upper chest demonstrates no acute finding. Partially visualized lungs are clear. Review of the MIP images confirms the above findings CTA HEAD FINDINGS Anterior circulation: Right ICA occluded at the skull base. Distal reconstitution at the supraclinoid segment via flow across the circle-of-Willis. Cavernous to supraclinoid right ICA stent grossly patent distally, not well assessed proximally. Petrous left ICA widely patent. Scattered multifocal plaque within the cavernous left ICA with approximate moderate stenosis, grossly similar to previous. ICA termini patent. A1 segments patent bilaterally. Right A1 hypoplastic. Normal anterior communicating artery. Anterior cerebral arteries patent to their distal aspects without stenosis. Right A1 slightly hypoplastic. Right M1 widely patent. Normal right MCA bifurcation. No proximal right M2 occlusion. Distal right MCA branches well perfused. Left M1 widely patent. Normal left MCA bifurcation. No proximal left M2 occlusion. Distal left MCA branches well perfused. Posterior circulation: Vertebral arteries patent to the vertebrobasilar junction without stenosis. Left PICA patent. Right  PICA not well seen. Basilar artery widely patent to its distal aspect. Superior cerebral arteries patent bilaterally. Both of the posterior cerebral arteries primarily supplied via the basilar and are widely patent to their distal aspects without stenosis. Venous sinuses: Grossly patent. Anatomic variants: None significant. Delayed phase: Not performed. Review of the MIP images confirms the above findings CT Brain Perfusion Findings: CBF (<30%) Volume: 13mL Perfusion (Tmax>6.0s) volume: 298mL Mismatch Volume: 250mL Infarction Location:Scattered watershed type infarcts seen involving the right MCA/ACA territory as well as the MCA/PCA territory as well as the deep watershed cerebral white matter. Large surrounding ischemic penumbra involving the entirety of the right MCA territory related to delayed perfusion due to the occluded right ICA stent. IMPRESSION: 1. Occluded/nearly occluded proximal right ICA stent, with little to absent flow distally within the neck. Distal reconstitution at the right ICA terminus with grossly patent flow within the distal cavernous/supraclinoid right ICA stent. Right MCA is perfused without proximal or large vessel occlusion. 2. Acute watershed type infarcts involving the right MCA/ACA and MCA/PCA territories as well as the deep cerebral white matter. Large surrounding penumbra throughout the right MCA distribution due to delayed perfusion from the occluded right ICA stent. 3. Grossly patent distal cavernous/supraclinoid right ICA stent. Evaluation for possible intra stent stenosis difficult given proximal occlusion. 4. Atheromatous plaque within the cavernous left ICA with approximate 50% stenosis, similar to previous. 5. Short-segment atheromatous stenosis of approximately 50% at the proximal innominate artery. 6. Moderate atheromatous stenoses of up to approximate 50% involving the proximal right V1 segment, with mild stenosis at the origin of the left vertebral artery. Otherwise  patent vertebrobasilar system. 7. 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 These results were communicated to Dr. Leonel Ramsay at 9:57 pmon 9/16/2019by text page via the [facility] messaging system. Electronically Signed   By: Jeannine Boga M.D.   On: 10/25/2017 22:10   Ct Head Code Stroke Wo Contrast  Result Date: 10/25/2017 CLINICAL DATA:  Code stroke. Right-sided gaze, left-sided neurologic deficits. EXAM: CT HEAD WITHOUT CONTRAST TECHNIQUE: Contiguous axial images were obtained from the base of the skull through the vertex without intravenous contrast. COMPARISON:  Head CT 09/19/2017 FINDINGS: Brain: There is no mass, hemorrhage or extra-axial collection. The size and configuration of the ventricles and extra-axial CSF spaces are normal. There are 2 subcortical old right frontal lobe infarcts, unchanged.  There is hypoattenuation of the periventricular white matter, most commonly indicating chronic ischemic microangiopathy. Vascular: No abnormal hyperdensity of the major intracranial arteries or dural venous sinuses. No intracranial atherosclerosis. Skull: The visualized skull base, calvarium and extracranial soft tissues are normal. Sinuses/Orbits: No fluid levels or advanced mucosal thickening of the visualized paranasal sinuses. No mastoid or middle ear effusion. The orbits are normal. ASPECTS [facility] Stroke Program Early CT Score) - Ganglionic level infarction (caudate, lentiform nuclei, internal capsule, insula, M1-M3 cortex): 7 - Supraganglionic infarction (M4-M6 cortex): 3 Total score (0-10 with 10 being normal): 10 IMPRESSION: 1. No acute hemorrhage. 2. ASPECTS is 10. 3. Old subcortical infarcts within the right frontal lobe, unchanged compared to 09/19/2017. These results were communicated to Dr. Roland Rack at 9:13 pm on 10/25/2017 by text page via the [facility] messaging system. Electronically Signed   By: Ulyses Jarred M.D.   On:  10/25/2017 21:13    PHYSICAL EXAM Unkempt middle aged Caucasian male not in distress. . Afebrile. Head is nontraumatic. Neck is supple without bruit.    Cardiac exam no murmur or gallop. Lungs are clear to auscultation. Distal pulses are well felt.  Neurological Exam :  Awake alert oriented x 3 dysarthricl speech bMild left lower face asymmetry. Tongue midline. Mild LUE and LLE drift.Left hemiparesis 3/5  Mild diminished fine finger movements on left. Orbits right over left upper extremity.  . Normal sensation . Normal coordination.Gait deferred   ASSESSMENT/PLAN [name] is a 68 y.o. male with history of etoh abuse, CAD, CHF, COPD, DB, GERD, DVT, HTN, HLD, MI, OSA on CPAP, PVD, prior R brain stroke w/ R ICA stent placement and resultant L HPpresenting with worsening L HP.   Stroke:  right MCA territory and watershed infarcts secondary to occluded R ICA stent in setting of multiple uncontrolled risk factors  Code Stroke CT head No acute stroke.  Old right frontal subcortical infarcts. ASPECTS 10.     CTA head & neck occluded/nearly occluded R ICA stent with reconstitution at right ICA terminus.  Right MCA is perfused.  Right MCA/ACA and MCA/PCA territory infarcts.  Left ICA plaque 50%.  Proximal innominate artery 50%.  Proximal right V1 50%.  Incidental occlusion left external carotid artery at origin with distal reconstitution.  CT perfusion Large surrounding penumbra throughout right MCA distribution due to delayed perfusion from occluded right ICA stent.  MRI scattered are MCA territory and watershed zone infarcts due to proximal right ICA stent occlusion.  Additional right ACA ischemic changes likely due to hypoperfusion given hyperplastic right ACA.  Abnormal flow right ICA to level of supraclinoid segment.  Underlying chronic watershed right MCA territory infarcts  Carotid Doppler see CTA 2D Echo Left ventricle: The cavity size was normal. Wall thickness was   normal.  Systolic function was normal. The estimated ejection   fraction was in the range of 55% to 60%. Doppler parameters are   consistent with abnormal left ventricular relaxation (grade 1    diastolic dysfunction  LDL 97  HgbA1c 5.9 in April  Lovenox 40 mg sq daily for VTE prophylaxis Diet Order            Diet heart healthy/carb modified Room service appropriate? Yes; Fluid consistency: Thin  Diet effective now              clopidogrel 75 mg daily prior to admission, now on aspirin 325 mg daily and clopidogrel 75 mg daily.  Continue dual antiplatelet therapy (on rectal aspirin  if unable to take p.o.'s)  Therapy recommendations: Pending  Disposition: Pending  Hypertension  Stable . Permissive hypertension (OK if < 220/120) but gradually normalize in 5-7 days . BP goal 140-160 d/t R ICA occlusion  Hyperlipidemia  Home meds:  lipitor 80, resumed in hospital  LDL 97, goal < 70  Continue statin at discharge  Diabetes type II  HgbA1c 5.9 in April, at goal < 7.0  Other Stroke Risk Factors  Advanced age  Cocaine positive UDS this admission. Hx cocaine use  Cigarette smoker, advised to stop smoking  ETOH use, advised to drink no more than 2 drink(s) a day  Obesity, Body mass index is 32.74 kg/m., recommend weight loss, diet and exercise as appropriate   Hx stroke/TIA  07/2016 patchy multifocal acute ischemic right MCA territory infarcts - likely embolic from right ICA stent  08/2014 New Non-dominant right posterior frontal infarct secondary to hypotension in setting of  severe R paraclinoid stenosis  07/2014 Non-dominant watershed   infarcts likely due to hypotension with intracranial right carotid stenosis.  Coronary artery disease - MI  Obstructive sleep apnea  PDA  Chronic diastolic CHF  Other Active Problems  Baseline dementia on Namenda   Depression on Cymbalta, Requip   non-compliance  COPD  Hospital day # 2  .  He presented with worsening of  left hemiparesis  Due to right hemispheric watershed infarcts from chronic occlusion. Recommend mild induced hypertension with SBP goal 140-160. Patient counselled to quit smoking and drugs and aggressive risk factor control. Dual antiplatelet therapy. Discussed with Dr. Bonner Puna. Stroke team will sign off. Kindly call for questions if any  Antony Contras, MD Medical Director [place] Pager: [phone] 10/27/2017 4:43 PM  To contact Stroke Continuity provider, please refer to [email or web address] After hours, contact General Neurology

## 2017-10-27 NOTE — Progress Notes (Signed)
OT Cancellation Note  Patient Details Name: [name] MRN: [id number] DOB: [birth year]   Cancelled Treatment:    Reason Eval/Treat Not Completed: Patient declined, no reason specified. Patient declined participation at this time.  Will continue to follow and initiate OT eval as able.   Delight Stare, OT Acute Rehabilitation Services Pager [phone] Office [phone]   Delight Stare 10/27/2017, 11:47 AM
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
## 2017-10-28 NOTE — NC FL2 (Signed)
[place] LEVEL OF CARE SCREENING TOOL     IDENTIFICATION  Patient Name: [name] Birthdate: [birth year] Sex: male Admission Date (Current Location): 10/25/2017  [facility] and [place] Number:  Herbalist and Address:  [facility], [place] [address] [place], [place] [address]      Provider Number: [id number]  Attending Physician Name and Address:  Patrecia Pour, MD  Relative Name and Phone Number:       Current Level of Care: Hospital Recommended Level of Care: [place] Prior Approval Number:    Date Approved/Denied:   PASRR Number: [id number]  Discharge Plan: SNF    Current Diagnoses: Patient Active Problem List   Diagnosis Date Noted  . Left shoulder pain 09/28/2017  . Cerebral thrombosis with cerebral infarction 07/18/2016  . Acute ischemic stroke ([place])   . Left hemiplegia ([place])   . Stroke-like symptoms 07/17/2016  . Insomnia 01/29/2016  . Depression 01/29/2016  . Alcohol abuse with intoxication ([place]) 10/16/2015  . Psoriasiform dermatitis 07/12/2015  . Dementia due to another medical condition 07/12/2015  . Urinary incontinence 03/14/2015  . Diabetes mellitus, type 2 ([place]) 03/13/2015  . Hemiparesis affecting left side as late effect of cerebrovascular accident ([place]) 02/18/2015  . Cerebrovascular accident (CVA) due to stenosis of right carotid artery ([place])   . Coronary artery disease involving native coronary artery of native heart without angina pectoris   . Tachypnea   . Thrombocytopenia ([place])   . History of stroke   . Stroke (cerebrum) ([place]) 02/06/2015  . Arterial ischemic stroke, MCA (middle cerebral artery), right, acute ([place])   . DOE (dyspnea on exertion) 11/23/2014  . PAF (paroxysmal atrial fibrillation) ([place]) 11/23/2014  . HLD (hyperlipidemia) 11/01/2014  . Tobacco use disorder 11/01/2014  . Diastolic CHF, acute on chronic (HCC) 11/01/2014  . Cerebral infarction due to stenosis of  right carotid artery ([place]) 11/01/2014  . Carotid stenosis 11/01/2014  . Hyperkalemia 10/24/2014  . H/O total knee replacement 10/24/2014  . Essential hypertension   . Polysubstance abuse ([place]) 08/28/2014  . Cocaine abuse ([place])   . Intracranial carotid stenosis   . Headache around the eyes 08/27/2014  . Acute CVA (cerebrovascular accident) ([place]) 08/27/2014  . CVA (cerebral vascular accident) ([place]) 08/26/2014  . History of DVT (deep vein thrombosis)   . Alcoholism ([place])   . Embolic stroke ([place])   . Cerebral infarction due to unspecified mechanism   . Hypotension   . AKI (acute kidney injury) ([place])   . Stroke ([place])   . CVA (cerebral infarction) 08/01/2014  . Alcohol dependence with withdrawal with complication ([place]) 82/42/3536  . DVT (deep venous thrombosis) ([place]) 02/22/2014  . Lower extremity edema 02/21/2014  . Chronic diastolic congestive heart failure ([place]) 11/21/2013  . CAD (coronary artery disease) 11/21/2013  . Alcohol abuse 11/21/2013  . GERD (gastroesophageal reflux disease) 08/10/2013  . Gout 08/10/2013  . Tobacco abuse 07/26/2013  . Trichiasis without entropion 04/16/2013  . Dizziness 04/10/2013  . Eyelid lesion 01/24/2013  . Enophthalmos due to trauma 01/19/2013  . Medial orbital wall fracture (HCC) 01/19/2013  . Closed blow-out fracture of floor of orbit ([place]) 01/19/2013  . Binocular vision disorder with diplopia 01/03/2013  . Fracture of inferior orbital wall ([place]) 01/03/2013  . Fracture of orbital floor ([place]) 01/03/2013  . Pain of left eye 01/03/2013  . Peripheral vascular disease ([place]) 07/27/2012  . COPD (chronic obstructive pulmonary disease) ([place]) 04/04/2012  . HTN (hypertension) 04/04/2012  Orientation RESPIRATION BLADDER Height & Weight     Self, Time, Situation, Place  Normal Continent Weight: 103.5 kg Height:  5\' 10"  (177.8 cm)  BEHAVIORAL SYMPTOMS/MOOD NEUROLOGICAL BOWEL NUTRITION STATUS      Continent Diet(heart healthy/ carb modified)   AMBULATORY STATUS COMMUNICATION OF NEEDS Skin   Extensive Assist(uses power wheelchair at baseline) Verbally Normal                       Personal Care Assistance Level of Assistance  Bathing, Feeding, Dressing Bathing Assistance: Limited assistance Feeding assistance: Independent Dressing Assistance: Limited assistance     Functional Limitations Info  Sight, Hearing, Speech Sight Info: Adequate(wears glasses) Hearing Info: Adequate Speech Info: Adequate    SPECIAL CARE FACTORS FREQUENCY  PT (By licensed PT), OT (By licensed OT), Speech therapy     PT Frequency: 5x/wk OT Frequency: 5x/wk     Speech Therapy Frequency: 5x/wk      Contractures Contractures Info: Not present    Additional Factors Info  Allergies, Code Status, Psychotropic Code Status Info: DNR Allergies Info: DARVOCET PROPOXYPHENE N-ACETAMINOPHEN, HALDOL HALOPERIDOL DECANOATE, ACETAMINOPHEN, METFORMIN AND RELATED, NORCO HYDROCODONE-ACETAMINOPHEN  Psychotropic Info: cymbalta DR 60 mg daily/ Namenda 10 mg BiD/ Lyrica 150 mg BID         Current Medications (10/28/2017):  This is the current hospital active medication list Current Facility-Administered Medications  Medication Dose Route Frequency Provider Last Rate Last Dose  .  stroke: mapping our early stages of recovery book   Does not apply Once Ivor Costa, MD      . albuterol (PROVENTIL) (2.5 MG/3ML) 0.083% nebulizer solution 2.5 mg  2.5 mg Nebulization Q4H PRN Ivor Costa, MD   2.5 mg at 10/26/17 0325  . allopurinol (ZYLOPRIM) tablet 300 mg  300 mg Oral Daily Ivor Costa, MD   300 mg at 10/28/17 0918  . aspirin suppository 300 mg  300 mg Rectal Daily Ivor Costa, MD   300 mg at 10/26/17 1136   Or  . aspirin tablet 325 mg  325 mg Oral Daily Ivor Costa, MD   325 mg at 10/28/17 0918  . atorvastatin (LIPITOR) tablet 80 mg  80 mg Oral q1800 Ivor Costa, MD   80 mg at 10/27/17 1815  . clopidogrel (PLAVIX) tablet 75 mg  75 mg Oral Daily Ivor Costa, MD   75  mg at 10/28/17 0918  . DULoxetine (CYMBALTA) DR capsule 60 mg  60 mg Oral Daily Ivor Costa, MD   60 mg at 10/28/17 0918  . enoxaparin (LOVENOX) injection 40 mg  40 mg Subcutaneous Daily Ivor Costa, MD   40 mg at 10/28/17 0917  . folic acid (FOLVITE) tablet 1 mg  1 mg Oral Daily Ivor Costa, MD   1 mg at 10/28/17 2585  . hydrALAZINE (APRESOLINE) injection 5 mg  5 mg Intravenous Q2H PRN Ivor Costa, MD      . lactulose ([place]) 10 GM/15ML solution 10 g  10 g Oral BID PRN Ivor Costa, MD      . memantine [facility]) tablet 10 mg  10 mg Oral BID Ivor Costa, MD   10 mg at 10/28/17 0918  . mometasone-formoterol (DULERA) 200-5 MCG/ACT inhaler 2 puff  2 puff Inhalation BID Ivor Costa, MD   2 puff at 10/28/17 0857  . multivitamin with minerals tablet 1 tablet  1 tablet Oral Daily Ivor Costa, MD   1 tablet at 10/28/17 [id number]  . nicotine (NICODERM CQ - dosed in mg/24  hours) patch 21 mg  21 mg Transdermal Daily Ivor Costa, MD   21 mg at 10/28/17 0919  . pantoprazole (PROTONIX) EC tablet 40 mg  40 mg Oral q morning - 10a Ivor Costa, MD   40 mg at 10/27/17 1033  . polyethylene glycol (MIRALAX / GLYCOLAX) packet 17 g  17 g Oral Daily Ivor Costa, MD   17 g at 10/28/17 0917  . pregabalin (LYRICA) capsule 150 mg  150 mg Oral BID Ivor Costa, MD   150 mg at 10/28/17 0918  . rOPINIRole (REQUIP) tablet 0.5 mg  0.5 mg Oral QHS Ivor Costa, MD   0.5 mg at 10/27/17 2114  . selenium sulfide (SELSUN) 1 % shampoo 1 application  1 application Topical PRN Ivor Costa, MD      . senna-docusate (Senokot-S) tablet 1 tablet  1 tablet Oral QHS PRN Ivor Costa, MD      . thiamine (VITAMIN B-1) tablet 100 mg  100 mg Oral Daily Ivor Costa, MD   100 mg at 10/28/17 0722   Or  . thiamine (B-1) injection 100 mg  100 mg Intravenous Daily Ivor Costa, MD   100 mg at 10/26/17 1133  . tiZANidine (ZANAFLEX) tablet 4 mg  4 mg Oral Q8H PRN Ivor Costa, MD      . traZODone (DESYREL) tablet 150 mg  150 mg Oral QHS PRN Ivor Costa, MD      . triamcinolone  cream (KENALOG) 0.1 % 1 application  1 application Topical Daily PRN Ivor Costa, MD         Discharge Medications: Please see discharge summary for a list of discharge medications.  Relevant Imaging Results:  Relevant Lab Results:   Additional Information SS# [id number]  Pollie Friar, RN

## 2017-10-28 NOTE — Progress Notes (Addendum)
PROGRESS NOTE  [name]  OBS:[id number] DOB: 07-16-1950 DOA: 10/25/2017 PCP: Charlott Rakes, MD   Brief Narrative: [name] is a 67 y.o. male with a history of polysubstance abuse (EtOH, tobacco, cocaine), CAD, chronic HFpEF, PVD, OSA, HTN, HLD, diet-controlled T2DM, COPD, carotid stenosis and CVA with left-sided weakness s/p right ICA stent who presented with worsening left sided weakness, slurred speech, blurry vision found to have severely stenotic right ICA with watershed infarcts.   Assessment & Plan: Principal Problem:   CVA (cerebral vascular accident) ([place]) Active Problems:   COPD (chronic obstructive pulmonary disease) (HCC)   GERD (gastroesophageal reflux disease)   Gout   Chronic diastolic congestive heart failure (HCC)   CAD (coronary artery disease)   Alcohol abuse   Stroke ([place])   Essential hypertension   HLD (hyperlipidemia)   Tobacco use disorder   Dementia due to another medical condition   Depression  Multifocal right-sided watershed infarcts secondary to occluded right ICA stent involving right MCA/ACA and MCA/PCA: CTA head/neck with nearly occluded ICA stent on right with reconstitution. - Needs steady perfusion, so BP goal per neurology is 140-140mmHg.  - Continue DAPT - Continue statin, LDL 97.   - CSW to attempt to arrange SNF placement.   COPD: Stable - Continue prn's  GERD: Stable - Continue PPI  Gout: No flare.  - Continue allopurinol  Chronic HFpEF: TTE 07/18/2016 showed EF 50-55% with grade 1 diastolic dysfunction. - Continue current treatment. Note patient on lasix 60mg  BID at home. Does not appear overloaded currently and is not taking significant po but will stop IVFs for now and monitor daily weight, I/O.   CAD: No chest pain.  - Continue DAPT as above  Tobacco use, alcohol abuse, cocaine use: UDS +cocaine. - Cessation counseling provided by multiple providers during hospitalization  Dementia: MOCA 7.1 (minus written  portion) performed by SLP therapist 9/19 with score of 20/25.  - Continue namenda - Continue cognitive/linguistic eval at SNF.  Depression:  - Continue cymbalta, requip  Diet-controlled T2DM: HbA1c 5.5% - Monitor glucose intermittently.  - Carb-modified diet.  DVT prophylaxis: Lovenox Code Status: DNR Family Communication: None at bedside Disposition Plan: SNF once bed available. He is medically stable. CSW aware and working diligently.  Consultants:   Neurology  Subjective: No new deficits, but his weakness on the left is severe limiting all functional mobility. Working with PT/OT.   Objective: Vitals:   10/27/17 2321 10/28/17 0326 10/28/17 0745 10/28/17 0857  BP: (!) 159/73 (!) 149/77 (!) 188/88   Pulse: 81 88 79   Resp: 20 18 20    Temp: 98.1 F (36.7 C) 98 F (36.7 C) 98 F (36.7 C)   TempSrc: Oral Oral Oral   SpO2: 98% 98% 94% 95%  Weight:      Height:        Intake/Output Summary (Last 24 hours) at 10/28/2017 1059 Last data filed at 10/28/2017 0500 Gross per 24 hour  Intake 1472.49 ml  Output 2850 ml  Net -1377.51 ml   Filed Weights   10/25/17 2110  Weight: 103.5 kg   Gen: 67 y.o. male in no distress Pulm: Nonlabored breathing room air. Clear. CV: Regular rate and rhythm. No murmur, rub, or gallop. No JVD, no dependent edema. GI: Abdomen soft, non-tender, non-distended, with normoactive bowel sounds.  Ext: Warm, no deformities Skin: No rashes, lesions or ulcers on visualized skin.  Neuro: Alert and oriented. Left elbow flexion 3/5, grip 2/5 and shoulder 1/5, LLE with  2/5 stable. Sensation intact.  Psych: Judgement and insight appear mostly intact. Mood euthymic & affect congruent. Behavior is appropriate.    Data Reviewed: I have personally reviewed following labs and imaging studies  CBC: Recent Labs  Lab 10/25/17 2100 10/25/17 2105 10/27/17 0344  WBC 7.1  --  5.3  NEUTROABS 4.4  --   --   HGB 14.0 14.6 13.6  HCT 45.0 43.0 43.6  MCV 94.5  --   92.4  PLT 176  --  096   Basic Metabolic Panel: Recent Labs  Lab 10/25/17 2100 10/25/17 2105 10/27/17 0344  NA 142 142 144  K 4.2 4.2 4.0  CL 103 102 108  CO2 29  --  28  GLUCOSE 120* 116* 93  BUN 14 15 8   CREATININE 1.00 0.90 0.71  CALCIUM 9.5  --  9.7  MG  --   --  2.2   GFR: Estimated Creatinine Clearance: 106.5 mL/min (by C-G formula based on SCr of 0.71 mg/dL). Liver Function Tests: Recent Labs  Lab 10/25/17 2100 10/27/17 0344  AST 16 14*  ALT 14 14  ALKPHOS 71 62  BILITOT 0.4 1.0  PROT 6.2* 5.8*  ALBUMIN 3.7 3.2*   No results for input(s): LIPASE, AMYLASE in the last 168 hours. No results for input(s): AMMONIA in the last 168 hours. Coagulation Profile: Recent Labs  Lab 10/25/17 2100  INR 0.98   Cardiac Enzymes: No results for input(s): CKTOTAL, CKMB, CKMBINDEX, TROPONINI in the last 168 hours. BNP (last 3 results) No results for input(s): PROBNP in the last 8760 hours. HbA1C: Recent Labs    10/26/17 0322  HGBA1C 5.5   CBG: Recent Labs  Lab 10/25/17 2058  GLUCAP 111*   Lipid Profile: Recent Labs    10/26/17 0322  CHOL 161  HDL 43  LDLCALC 97  TRIG 103  CHOLHDL 3.7   Thyroid Function Tests: No results for input(s): TSH, T4TOTAL, FREET4, T3FREE, THYROIDAB in the last 72 hours. Anemia Panel: No results for input(s): VITAMINB12, FOLATE, FERRITIN, TIBC, IRON, RETICCTPCT in the last 72 hours. Urine analysis:    Component Value Date/Time   COLORURINE YELLOW 10/26/2017 0332   APPEARANCEUR CLEAR 10/26/2017 0332   LABSPEC >1.046 (H) 10/26/2017 0332   PHURINE 7.0 10/26/2017 0332   GLUCOSEU NEGATIVE 10/26/2017 0332   HGBUR NEGATIVE 10/26/2017 0332   BILIRUBINUR NEGATIVE 10/26/2017 0332   KETONESUR NEGATIVE 10/26/2017 0332   PROTEINUR NEGATIVE 10/26/2017 0332   UROBILINOGEN 0.2 08/19/2014 2103   NITRITE NEGATIVE 10/26/2017 0332   LEUKOCYTESUR NEGATIVE 10/26/2017 0332   No results found for this or any previous visit (from the past 240  hour(s)).    Radiology Studies: No results found.  Scheduled Meds: .  stroke: mapping our early stages of recovery book   Does not apply Once  . allopurinol  300 mg Oral Daily  . aspirin  300 mg Rectal Daily   Or  . aspirin  325 mg Oral Daily  . atorvastatin  80 mg Oral q1800  . clopidogrel  75 mg Oral Daily  . DULoxetine  60 mg Oral Daily  . enoxaparin (LOVENOX) injection  40 mg Subcutaneous Daily  . folic acid  1 mg Oral Daily  . LORazepam  0-4 mg Intravenous Q12H  . memantine  10 mg Oral BID  . mometasone-formoterol  2 puff Inhalation BID  . multivitamin with minerals  1 tablet Oral Daily  . nicotine  21 mg Transdermal Daily  . pantoprazole  40 mg  Oral q morning - 10a  . polyethylene glycol  17 g Oral Daily  . pregabalin  150 mg Oral BID  . rOPINIRole  0.5 mg Oral QHS  . thiamine  100 mg Oral Daily   Or  . thiamine  100 mg Intravenous Daily   Continuous Infusions: . sodium chloride 75 mL/hr at 10/27/17 2151     LOS: 3 days   Time spent: 25 minutes.  Patrecia Pour, MD [facility] [email or web address] Password [email or web address] 10/28/2017, 10:59 AM

## 2017-10-28 NOTE — Care Management Note (Signed)
Case Management Note  Patient Details  Name: [name] MRN: [id number] Date of Birth: [birth year]  Subjective/Objective:     Pt admitted with a CVA. He is from [place] where he lives independently. He has an aide 3 hours a day for 5 days a week. Pt has a power wheelchair and cane at home.                Action/Plan: Recommendations are for SNF. CM met with the patient and discussed the recommendations. Pt agreeable and would like to be faxed out in the [facility] area. Pt was positive for cocaine so he is aware that his choices of SNF are going to be limited.  Pt needing a letter for [employer] that he is in the hospital so they will save his apartment. CM will send a letter to [facility].  Pt will need PTAR when ready to d/c to SNF. CM following.  Expected Discharge Date:                  Expected Discharge Plan:  Skilled Nursing Facility  In-House Referral:  Clinical Social Work  Discharge planning Services  CM Consult  Post Acute Care Choice:    Choice offered to:     DME Arranged:    DME Agency:     HH Arranged:    [place] Agency:     Status of Service:  In process, will continue to follow  If discussed at Long Length of Stay Meetings, dates discussed:    Additional Comments:  Pollie Friar, RN 10/28/2017, 11:25 AM
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
## 2017-10-29 DIAGNOSIS — R2681 Unsteadiness on feet: Secondary | ICD-10-CM | POA: Diagnosis not present

## 2017-10-29 DIAGNOSIS — I480 Paroxysmal atrial fibrillation: Secondary | ICD-10-CM | POA: Diagnosis not present

## 2017-10-29 DIAGNOSIS — E785 Hyperlipidemia, unspecified: Secondary | ICD-10-CM | POA: Diagnosis not present

## 2017-10-29 DIAGNOSIS — I639 Cerebral infarction, unspecified: Secondary | ICD-10-CM | POA: Diagnosis not present

## 2017-10-29 DIAGNOSIS — I10 Essential (primary) hypertension: Secondary | ICD-10-CM | POA: Diagnosis not present

## 2017-10-29 DIAGNOSIS — I63031 Cerebral infarction due to thrombosis of right carotid artery: Secondary | ICD-10-CM | POA: Diagnosis not present

## 2017-10-29 DIAGNOSIS — G2581 Restless legs syndrome: Secondary | ICD-10-CM | POA: Diagnosis not present

## 2017-10-29 DIAGNOSIS — M6281 Muscle weakness (generalized): Secondary | ICD-10-CM | POA: Diagnosis not present

## 2017-10-29 DIAGNOSIS — K219 Gastro-esophageal reflux disease without esophagitis: Secondary | ICD-10-CM | POA: Diagnosis not present

## 2017-10-29 DIAGNOSIS — R531 Weakness: Secondary | ICD-10-CM | POA: Diagnosis not present

## 2017-10-29 DIAGNOSIS — I5033 Acute on chronic diastolic (congestive) heart failure: Secondary | ICD-10-CM | POA: Diagnosis not present

## 2017-10-29 DIAGNOSIS — F1010 Alcohol abuse, uncomplicated: Secondary | ICD-10-CM | POA: Diagnosis not present

## 2017-10-29 DIAGNOSIS — Z743 Need for continuous supervision: Secondary | ICD-10-CM | POA: Diagnosis not present

## 2017-10-29 DIAGNOSIS — R279 Unspecified lack of coordination: Secondary | ICD-10-CM | POA: Diagnosis not present

## 2017-10-29 DIAGNOSIS — M1000 Idiopathic gout, unspecified site: Secondary | ICD-10-CM | POA: Diagnosis not present

## 2017-10-29 DIAGNOSIS — I5032 Chronic diastolic (congestive) heart failure: Secondary | ICD-10-CM | POA: Diagnosis not present

## 2017-10-29 DIAGNOSIS — I2510 Atherosclerotic heart disease of native coronary artery without angina pectoris: Secondary | ICD-10-CM | POA: Diagnosis not present

## 2017-10-29 DIAGNOSIS — L299 Pruritus, unspecified: Secondary | ICD-10-CM | POA: Diagnosis not present

## 2017-10-29 DIAGNOSIS — I69354 Hemiplegia and hemiparesis following cerebral infarction affecting left non-dominant side: Secondary | ICD-10-CM | POA: Diagnosis not present

## 2017-10-29 DIAGNOSIS — E1140 Type 2 diabetes mellitus with diabetic neuropathy, unspecified: Secondary | ICD-10-CM | POA: Diagnosis not present

## 2017-10-29 DIAGNOSIS — J449 Chronic obstructive pulmonary disease, unspecified: Secondary | ICD-10-CM | POA: Diagnosis not present

## 2017-10-29 DIAGNOSIS — E118 Type 2 diabetes mellitus with unspecified complications: Secondary | ICD-10-CM | POA: Diagnosis not present

## 2017-10-29 DIAGNOSIS — L309 Dermatitis, unspecified: Secondary | ICD-10-CM | POA: Diagnosis not present

## 2017-10-29 LAB — BASIC METABOLIC PANEL
ANION GAP: 8 (ref 5–15)
BUN: 11 mg/dL (ref 8–23)
CALCIUM: 9.7 mg/dL (ref 8.9–10.3)
CO2: 28 mmol/L (ref 22–32)
Chloride: 106 mmol/L (ref 98–111)
Creatinine, Ser: 0.66 mg/dL (ref 0.61–1.24)
GFR calc Af Amer: >60 mL/min (ref >60)
GLUCOSE: 96 mg/dL (ref 70–99)
POTASSIUM: 4 mmol/L (ref 3.5–5.1)
SODIUM: 142 mmol/L (ref 135–145)

## 2017-10-29 MED ORDER — NAPHAZOLINE-PHENIRAMINE 0.025-0.3 % OP SOLN
1.0000 [drp] | Freq: Four times a day (QID) | OPHTHALMIC | Status: DC | PRN
  Filled 2017-10-29: qty 15

## 2017-10-29 MED ORDER — NAPHAZOLINE-PHENIRAMINE 0.025-0.3 % OP SOLN: 1.0000 [drp] | Freq: Four times a day (QID) | OPHTHALMIC | 0 refills | Status: DC | PRN

## 2017-10-29 MED ORDER — FUROSEMIDE 40 MG PO TABS
40.0000 mg | ORAL_TABLET | Freq: Two times a day (BID) | ORAL | Status: DC
  Administered 2017-10-29 (×2): 40 mg via ORAL
  Filled 2017-10-29 (×2): qty 1

## 2017-10-29 MED ORDER — ASPIRIN 325 MG PO TABS: 325.0000 mg | ORAL_TABLET | Freq: Every day | ORAL | Status: DC

## 2017-10-29 NOTE — Progress Notes (Signed)
Physical Therapy Treatment Patient Details Name: [name] MRN: [id number] DOB: Nov 06, 1950 Today's Date: 10/29/2017    History of Present Illness Pt is a 67 y.o. male with medical history significant of hypertension, hyperlipidemia, diet-controlled diabetes, COPD, stroke with left-sided weakness, GERD, gout, polysubstance abuse (alcohol, tobacco and cocaine), CAD, dCHF, DVT, OSA not on CPAP, PVD, dementia, who presented on 10/25/17 with worsening left-sided weakness, slurred speech and blurry vision. MRI + for R MCA territory and watershed infarcts secondary to occulded R ICA stent.     PT Comments    Patient progressing slowly towards PT goals. Improved ambulation distance with Min A and chair follow for safety. Pt tends to fatigue which is demonstrated by increased weakness in LLE and left knee instability. Encouraged pt to use LUE for all transfers to improve WB and function. Appropriate for SNF. Will follow.    Follow Up Recommendations  SNF;Supervision/Assistance - 24 hour     Equipment Recommendations  None recommended by PT    Recommendations for Other Services       Precautions / Restrictions Precautions Precautions: Fall Precaution Comments: L UE subluxation  Restrictions Weight Bearing Restrictions: No    Mobility  Bed Mobility Overal bed mobility: Needs Assistance Bed Mobility: Supine to Sit     Supine to sit: Min assist;HOB elevated     General bed mobility comments: min assist for trunk support to ascend  Transfers Overall transfer level: Needs assistance Equipment used: Straight cane Transfers: Sit to/from Stand Sit to Stand: Min assist         General transfer comment: min assist for safety and balance with use of momentum and cues to push through LUE. Stood from [employer], from chair x1. Worked on slow descent into chair.  Ambulation/Gait Ambulation/Gait assistance: Min assist;+2 safety/equipment Gait Distance (Feet): 50 Feet(x2  bouts) Assistive device: Straight cane Gait Pattern/deviations: Step-through pattern;Decreased weight shift to right;Decreased stride length;Decreased step length - left Gait velocity: decreased   General Gait Details: Slow, unsteady gait with left knee instability. fatigues quickly. 1 seated rest break.   Stairs             Wheelchair Mobility    Modified Rankin (Stroke Patients Only)       Balance Overall balance assessment: Needs assistance Sitting-balance support: Single extremity supported;Feet supported Sitting balance-Leahy Scale: Fair     Standing balance support: During functional activity Standing balance-Leahy Scale: Poor Standing balance comment: reliant on UE support                            Cognition Arousal/Alertness: Awake/alert Behavior During Therapy: WFL for tasks assessed/performed Overall Cognitive Status: History of cognitive impairments - at baseline                                        Exercises      General Comments        Pertinent Vitals/Pain Pain Assessment: Faces Faces Pain Scale: Hurts a little bit Pain Location: "what doesnt hurt?" Pain Descriptors / Indicators: Aching Pain Intervention(s): Monitored during session    Home Living                      Prior Function            PT Goals (current goals can now be found in the  care plan section) Progress towards PT goals: Progressing toward goals    Frequency    Min 3X/week      PT Plan Current plan remains appropriate    Co-evaluation              AM-PAC PT "6 Clicks" Daily Activity  Outcome Measure  Difficulty turning over in bed (including adjusting bedclothes, sheets and blankets)?: Unable Difficulty moving from lying on back to sitting on the side of the bed? : Unable Difficulty sitting down on and standing up from a chair with arms (e.g., wheelchair, bedside commode, etc,.)?: Unable Help needed moving to and  from a bed to chair (including a wheelchair)?: A Little Help needed walking in hospital room?: A Little Help needed climbing 3-5 steps with a railing? : A Lot 6 Click Score: 11    End of Session Equipment Utilized During Treatment: Gait belt Activity Tolerance: Patient tolerated treatment well Patient left: in chair;with call bell/phone within reach;with chair alarm set Nurse Communication: Mobility status PT Visit Diagnosis: Unsteadiness on feet (R26.81);Other abnormalities of gait and mobility (R26.89);Hemiplegia and hemiparesis;Difficulty in walking, not elsewhere classified (R26.2);Other symptoms and signs involving the nervous system (R29.898) Hemiplegia - Right/Left: Left Hemiplegia - dominant/non-dominant: Non-dominant     Time: 3154-0086 PT Time Calculation (min) (ACUTE ONLY): 21 min  Charges:  $Gait Training: 8-22 mins                     Wray Kearns, PT, DPT Acute Rehabilitation Services Pager [phone] Office [phone]       [place] 10/29/2017, 12:05 PM

## 2017-10-29 NOTE — Progress Notes (Addendum)
PROGRESS NOTE  [name]  GQQ:[id number] DOB: [birth year] DOA: 10/25/2017 PCP: Charlott Rakes, MD   Brief Narrative: [name] is a 67 y.o. male with a history of polysubstance abuse (EtOH, tobacco, cocaine), CAD, chronic HFpEF, PVD, OSA, HTN, HLD, diet-controlled T2DM, COPD, carotid stenosis and CVA with left-sided weakness s/p right ICA stent who presented with worsening left sided weakness, slurred speech, blurry vision found to have severely stenotic right ICA with watershed infarcts.   Assessment & Plan: Principal Problem:   CVA (cerebral vascular accident) ([place]) Active Problems:   COPD (chronic obstructive pulmonary disease) (HCC)   GERD (gastroesophageal reflux disease)   Gout   Chronic diastolic congestive heart failure (HCC)   CAD (coronary artery disease)   Alcohol abuse   Stroke ([place])   Essential hypertension   HLD (hyperlipidemia)   Tobacco use disorder   Dementia due to another medical condition   Depression  Multifocal right-sided watershed infarcts secondary to occluded right ICA stent involving right MCA/ACA and MCA/PCA: CTA head/neck with nearly occluded ICA stent on right with reconstitution. - Needs steady perfusion, so BP goal per neurology is 140-189mmHg.  - Continue DAPT - Continue statin, LDL 97.   - CSW is attempting to arrange SNF placement.   COPD: Stable - Continue prn's - Having some sputum production that is slightly more yellow than baseline, not increased volume or dyspnea. Will monitor closely, restart lasix as below.  - Incentive spirometry  Allergic conjunctivitis:  - Topical prn. No evidence of bacterial infection.  GERD: Stable - Continue PPI  Gout: No flare.  - Continue allopurinol  Chronic HFpEF: TTE 07/18/2016 showed EF 50-55% with grade 1 diastolic dysfunction. - Restart lasix at home dosing. Monitor BP closely.  - Continue to monitor daily weight, I/O. Wt is up from admission.  CAD: No chest pain.  - Continue DAPT  as above  Tobacco use, alcohol abuse, cocaine use: UDS +cocaine. - Cessation counseling provided by multiple providers during hospitalization  Dementia: MOCA 7.1 (minus written portion) performed by SLP therapist 9/19 with score of 20/25.  - Continue namenda - Continue cognitive/linguistic eval at SNF.  Depression:  - Continue cymbalta, requip  Diet-controlled T2DM: HbA1c 5.5% - Monitor glucose intermittently.  - Carb-modified diet.  DVT prophylaxis: Lovenox Code Status: DNR Family Communication: None at bedside Disposition Plan: SNF once bed available. He is medically stable. CSW aware and working diligently.  Consultants:   Neurology  Subjective: Left arm > leg weakness is stable. Coughing up his usual amount of sputum more in the morning, has a yellow tint to it this morning. No dyspnea or chest pain or fever.  Objective: Vitals:   10/29/17 0017 10/29/17 0545 10/29/17 0854 10/29/17 0927  BP: (!) 170/73 (!) 150/74  135/77  Pulse: 84 78  77  Resp: 18 18  20   Temp: 97.9 F (36.6 C) 98.2 F (36.8 C)  (!) 97.5 F (36.4 C)  TempSrc: Oral Oral  Oral  SpO2: 94% 95% 94% 95%  Weight:      Height:        Intake/Output Summary (Last 24 hours) at 10/29/2017 0940 Last data filed at 10/29/2017 0924 Gross per 24 hour  Intake 840 ml  Output 1200 ml  Net -360 ml   Filed Weights   10/25/17 2110 10/28/17 1531  Weight: 103.5 kg 105.5 kg   Gen: 67 y.o. male in no distress Eyes:  Mild conjunctival injection on left, no FB. Visual acuity unchanged. Pulm: Nonlabored breathing  room air. Clear. CV: Regular rate and rhythm. No murmur, rub, or gallop. No JVD, trace LE dependent edema. GI: Abdomen soft, non-tender, non-distended, with normoactive bowel sounds.  Ext: Warm, no deformities Skin: No rashes, lesions or ulcers on visualized skin.  Neuro: Alert and oriented. Left elbow 2/5, grip 2/5, shoulder abduction 1/5. Left leg with 3/5 at knee, 4/5 at ankle, 2/5 at hip. Sensation  intact diffusely. Psych: Judgement and insight appear fair. Mood euthymic & affect congruent. Behavior is appropriate.    Data Reviewed: I have personally reviewed following labs and imaging studies  CBC: Recent Labs  Lab 10/25/17 2100 10/25/17 2105 10/27/17 0344  WBC 7.1  --  5.3  NEUTROABS 4.4  --   --   HGB 14.0 14.6 13.6  HCT 45.0 43.0 43.6  MCV 94.5  --  92.4  PLT 176  --  970   Basic Metabolic Panel: Recent Labs  Lab 10/25/17 2100 10/25/17 2105 10/27/17 0344 10/29/17 0550  NA 142 142 144 142  K 4.2 4.2 4.0 4.0  CL 103 102 108 106  CO2 29  --  28 28  GLUCOSE 120* 116* 93 96  BUN 14 15 8 11   CREATININE 1.00 0.90 0.71 0.66  CALCIUM 9.5  --  9.7 9.7  MG  --   --  2.2  --    GFR: Estimated Creatinine Clearance: 107.5 mL/min (by C-G formula based on SCr of 0.66 mg/dL). Liver Function Tests: Recent Labs  Lab 10/25/17 2100 10/27/17 0344  AST 16 14*  ALT 14 14  ALKPHOS 71 62  BILITOT 0.4 1.0  PROT 6.2* 5.8*  ALBUMIN 3.7 3.2*   No results for input(s): LIPASE, AMYLASE in the last 168 hours. No results for input(s): AMMONIA in the last 168 hours. Coagulation Profile: Recent Labs  Lab 10/25/17 2100  INR 0.98   Cardiac Enzymes: No results for input(s): CKTOTAL, CKMB, CKMBINDEX, TROPONINI in the last 168 hours. BNP (last 3 results) No results for input(s): PROBNP in the last 8760 hours. HbA1C: No results for input(s): HGBA1C in the last 72 hours. CBG: Recent Labs  Lab 10/25/17 2058  GLUCAP 111*   Lipid Profile: No results for input(s): CHOL, HDL, LDLCALC, TRIG, CHOLHDL, LDLDIRECT in the last 72 hours. Thyroid Function Tests: No results for input(s): TSH, T4TOTAL, FREET4, T3FREE, THYROIDAB in the last 72 hours. Anemia Panel: No results for input(s): VITAMINB12, FOLATE, FERRITIN, TIBC, IRON, RETICCTPCT in the last 72 hours. Urine analysis:    Component Value Date/Time   COLORURINE YELLOW 10/26/2017 0332   APPEARANCEUR CLEAR 10/26/2017 0332    LABSPEC >1.046 (H) 10/26/2017 0332   PHURINE 7.0 10/26/2017 0332   GLUCOSEU NEGATIVE 10/26/2017 0332   HGBUR NEGATIVE 10/26/2017 0332   BILIRUBINUR NEGATIVE 10/26/2017 0332   KETONESUR NEGATIVE 10/26/2017 0332   PROTEINUR NEGATIVE 10/26/2017 0332   UROBILINOGEN 0.2 08/19/2014 2103   NITRITE NEGATIVE 10/26/2017 0332   LEUKOCYTESUR NEGATIVE 10/26/2017 0332   No results found for this or any previous visit (from the past 240 hour(s)).    Radiology Studies: No results found.  Scheduled Meds: .  stroke: mapping our early stages of recovery book   Does not apply Once  . allopurinol  300 mg Oral Daily  . aspirin  300 mg Rectal Daily   Or  . aspirin  325 mg Oral Daily  . atorvastatin  80 mg Oral q1800  . clopidogrel  75 mg Oral Daily  . DULoxetine  60 mg Oral Daily  .  enoxaparin (LOVENOX) injection  40 mg Subcutaneous Daily  . folic acid  1 mg Oral Daily  . memantine  10 mg Oral BID  . mometasone-formoterol  2 puff Inhalation BID  . multivitamin with minerals  1 tablet Oral Daily  . nicotine  21 mg Transdermal Daily  . pantoprazole  40 mg Oral q morning - 10a  . polyethylene glycol  17 g Oral Daily  . pregabalin  150 mg Oral BID  . rOPINIRole  0.5 mg Oral QHS  . thiamine  100 mg Oral Daily   Or  . thiamine  100 mg Intravenous Daily   Continuous Infusions:    LOS: 4 days   Time spent: 25 minutes.  Patrecia Pour, MD [facility] [email or web address] Password [email or web address] 10/29/2017, 9:40 AM

## 2017-10-29 NOTE — Clinical Social Work Placement (Signed)
Nurse to call report to [phone], Room [place]  NOTE  Date:  10/29/2017  Patient Details  Name: [name] MRN: [id number] Date of Birth: [birth year]  Clinical Social Work is seeking post-discharge placement for this patient at the [place] level of care (*CSW will initial, date and re-position this form in  chart as items are completed):  Yes   Patient/family provided with [place] Work Department's list of facilities offering this level of care within the geographic area requested by the patient (or if unable, by the patient's family).  Yes   Patient/family informed of their freedom to choose among providers that offer the needed level of care, that participate in Medicare, Medicaid or managed care program needed by the patient, have an available bed and are willing to accept the patient.  Yes   Patient/family informed of [place]'s ownership interest in [facility] and [facility], as well as of the fact that they are under no obligation to receive care at these facilities.  PASRR submitted to EDS on 10/28/17     PASRR number received on       Existing PASRR number confirmed on 10/28/17     FL2 transmitted to all facilities in geographic area requested by pt/family on 10/28/17     FL2 transmitted to all facilities within larger geographic area on       Patient informed that his/her managed care company has contracts with or will negotiate with certain facilities, including the following:        Yes   Patient/family informed of bed offers received.  Patient chooses bed at [facility]     Physician recommends and patient chooses bed at      Patient to be transferred to [facility] on 10/29/17.  Patient to be transferred to facility by PTAR     Patient family notified on 10/29/17 of transfer.  Name of family member notified:        PHYSICIAN       Additional  Comment:    _______________________________________________ Geralynn Ochs, LCSW 10/29/2017, 4:43 PM

## 2017-10-29 NOTE — Clinical Social Work Note (Signed)
Clinical Social Work Assessment  Patient Details  Name: [name] MRN: [id number] Date of Birth: [birth year]  Date of referral:  10/29/17               Reason for consult:  Facility Placement                Permission sought to share information with:  Facility Art therapist granted to share information::  Yes, Verbal Permission Granted  Name::        Agency::  SNF  Relationship::     Contact Information:     Housing/Transportation Living arrangements for the past 2 months:  Apartment Source of Information:  Patient, Medical Team Patient Interpreter Needed:  None Criminal Activity/Legal Involvement Pertinent to Current Situation/Hospitalization:  No - Comment as needed Significant Relationships:  Friend Lives with:  Self Do you feel safe going back to the place where you live?  Yes Need for family participation in patient care:  No (Coment)  Care giving concerns:  Patient from home alone and will benefit from short term rehab at discharge.   Social Worker assessment / plan:  CSW alerted yesterday by [facility] that patient agreeable to SNF placement. CSW met with patient today to provide bed offers. CSW provided offers and discussed facility options. CSW contacted [facility] to confirm bed availability, and they have initiated insurance authorization.  Employment status:  Retired Nurse, adult PT Recommendations:  [place] / Referral to community resources:  [place]  Patient/Family's Response to care:  Patient agreeable to SNF.  Patient/Family's Understanding of and Emotional Response to Diagnosis, Current Treatment, and Prognosis:  Patient acknowledges that he isn't at his baseline at this time and would benefit from some rehab at discharge. Patient frustrated that he isn't able to move his left arm at all right now, says there's no way that he can go home like this. Patient  would like to know if there's any way to get his electric wheelchair from home, because it will be how he can get around without being able to use his own arm.  Emotional Assessment Appearance:  Appears stated age Attitude/Demeanor/Rapport:  Engaged Affect (typically observed):  Pleasant Orientation:  Oriented to Self, Oriented to Place, Oriented to  Time, Oriented to Situation Alcohol / Substance use:  Illicit Drugs Psych involvement (Current and /or in the community):  No (Comment)  Discharge Needs  Concerns to be addressed:  Care Coordination Readmission within the last 30 days:  No Current discharge risk:  Physical Impairment, Dependent with Mobility, Lives alone Barriers to Discharge:  Continued Medical Work up, [place], [place] 10/29/2017, 1:44 PM

## 2017-10-29 NOTE — Progress Notes (Signed)
Report called to [employer]. Await PTAR.

## 2017-10-29 NOTE — Progress Notes (Signed)
Pt discharged, waiting for transport back to [place] came and got pt at [place], pt reassured. [place], Dali Kraner Efe

## 2017-10-29 NOTE — Consult Note (Signed)
            [facility] CM Primary Care Navigator  10/29/2017  [name] 1950-04-17 [id number]   Seen patient at the bedside to identify possible discharge needs. Patient reports having "left side weakness, blurred vision and unsteady balance" that had ledthis admission.  (CVA- cerebral vascular accident)  Patient endorses Dr. Charlott Rakes with [facility] astheprimary care provider.    [facility] shared [facility] on [address] and [employer] on [address] to obtain medicationswithout any problemso far.   Patient'saide Bess Harvest) has been managinghismedications at home straight out of the containers.  Patient reports that his aide provides transportation tohisdoctors' appointments and goes with him to his appointments.  [employer] transportation benefits discussed with patient.  Patientlives alone at the [employer] and has an Engineer, production (from [employer]) who assists him 2- 3 hours daily and serves as his primary caregiver.  Anticipated discharge plan isskilled nursing facility (SNF)for rehabilitation per therapy recommendation.  Patient verbalized understanding to call primary care provider's office once hegets back home,for a post discharge follow-up appointment within1- 2 weeksor sooner if needs arise.Patient letter (with PCP's contact number) was provided as a reminder.  Discussed with patientregarding [facility] CM services available for health management and resourcesat home and had indicated interest for it. Patient encouraged to discuss with primary care provider on his next visit regarding needs to further manage his health issues- once he returns back home. Patient expressedunderstandingto seekreferralfrom primary care provider to [facility] care management ifdeemed necessary and appropriate for anyservices in thefuture- after return tohome.  [facility] care management information was provided  for future needsthat hemay have.   For additional questions please contact:  Edwena Felty A. Ladene Allocca, BSN, RN-BC [facility] PRIMARY CARE Navigator Cell: [phone]

## 2017-10-29 NOTE — Care Management Important Message (Signed)
Important Message  Patient Details  Name: [name] MRN: [id number] Date of Birth: [birth year]   Medicare Important Message Given:  Yes    [place] Bynum 10/29/2017, 2:00 PM

## 2017-10-29 NOTE — Discharge Summary (Signed)
Physician Discharge Summary  [name] DOB: February 05, 1951 DOA: 10/25/2017  PCP: Charlott Rakes, MD  Admit date: 10/25/2017 Discharge date: 10/29/2017  Admitted From: Home Disposition: SNF   Recommendations for Outpatient Follow-up:  1. Follow up with PCP in 1-2 weeks 2. Due to occluded right ICA stent, SBP goal is 140-160mmHg.  3. Follow up with neurology in 6 weeks  Home Health: None Equipment/Devices: Per SNF Discharge Condition: Stable CODE STATUS: DNR Diet recommendation: Heart healthy, carb-modified  Brief/Interim Summary: [name] is a 67 y.o. male with a history of polysubstance abuse (EtOH, tobacco, cocaine), CAD, chronic HFpEF, PVD, OSA, HTN, HLD, diet-controlled T2DM, COPD, carotid stenosis and CVA with left-sided weakness s/p right ICA stent who presented with worsening left sided weakness, slurred speech, blurry vision found to have severely stenotic right ICA with watershed infarcts. Neurology was consulted and made recommendations as below. He will require SNF rehabilitation due to persistent left-sided deficits.  Discharge Diagnoses:  Principal Problem:   CVA (cerebral vascular accident) ([place]) Active Problems:   COPD (chronic obstructive pulmonary disease) (H[facility])   GERD (gastroesophageal reflux disease)   Gout   Chronic diastolic congestive heart failure (H[facility])   CAD (coronary artery disease)   Alcohol abuse   Stroke ([place])   Essential hypertension   HLD (hyperlipidemia)   Tobacco use disorder   Dementia due to another medical condition   Depression  Multifocal right-sided watershed infarcts secondary to occluded right ICA stent involving right MCA/ACA and MCA/PCA: CTA head/neck with nearly occluded ICA stent on right with reconstitution. - Needs steady perfusion, so BP goal per neurology is 140-141mmHg.  - Continue DAPT (added aspirin 325mg  to plavix) - Continue statin, LDL 97.   - Continue therapy at SNF.  COPD: Stable -  Continue prn's - Incentive spirometry  Allergic conjunctivitis:  - Topical prn. No evidence of bacterial infection.  GERD: Stable - Continue PPI  Gout: No flare.  - Continue allopurinol  Chronic HFpEF: TTE6/10/2016 showed EF 50-55% with grade 1 diastolic dysfunction. - Restart lasix at home dosing. Monitor BP closely.  - Monitor volume status  CAD: No chest pain.  - Continue DAPT, statin, coreg as above  Tobacco use, alcohol abuse, cocaine use: UDS +cocaine. - Cessation counseling provided by multiple providers during hospitalization  Dementia: MOCA 7.1 (minus written portion) performed by SLP therapist 9/19 with score of 20/25.  - Continue namenda - Continue cognitive/linguistic eval at SNF.  Depression:  - Continue cymbalta, requip  Diet-controlled T2DM: HbA1c 5.5% - Monitor glucose intermittently.  - Carb-modified diet.  Discharge Instructions Discharge Instructions    Diet - low sodium heart healthy   Complete by:  As directed    Diet Carb Modified   Complete by:  As directed    Increase activity slowly   Complete by:  As directed      Allergies as of 10/29/2017      Reactions   Darvocet [propoxyphene N-acetaminophen] Hives   Haldol [haloperidol Decanoate] Hives   Acetaminophen Nausea Only   Upset stomach, tolerates Hydrocodone/APAP if taken with food   Metformin And Related Nausea And Vomiting   Norco [hydrocodone-acetaminophen] Nausea And Vomiting   Tolerates if taken with food      Medication List    STOP taking these medications   lisinopril 5 MG tablet Commonly known as:  PRINIVIL,ZESTRIL   polyethylene glycol packet Commonly known as:  MIRALAX / GLYCOLAX   Selenium Sulfide 2.25 % Sham   triamcinolone cream 0.1 %  Commonly known as:  KENALOG     TAKE these medications   ACCU-CHEK AVIVA device Use 3 times daily before meals.   accu-chek softclix lancets Use 3 times daily before meals   ACCU-CHEK SOFTCLIX LANCETS lancets Use 3  times daily before meals   albuterol 108 (90 Base) MCG/ACT inhaler Commonly known as:  PROVENTIL HFA;VENTOLIN HFA INHALE 2 PUFFS EVERY 6 HOURS AS NEEDED FOR WHEEZING OR SHORTNESS OF BREATH What changed:    how much to take  how to take this  when to take this  reasons to take this  additional instructions  Another medication with the same name was removed. Continue taking this medication, and follow the directions you see here.   allopurinol 300 MG tablet Commonly known as:  ZYLOPRIM Take 1 tablet (300 mg total) by mouth daily.   aspirin 325 MG tablet Take 1 tablet (325 mg total) by mouth daily. Start taking on:  10/30/2017   atorvastatin 80 MG tablet Commonly known as:  LIPITOR Take 1 tablet (80 mg total) by mouth daily at 6 PM.   carvedilol 12.5 MG tablet Commonly known as:  COREG Take 1 tablet (12.5 mg total) by mouth 2 (two) times daily with a meal.   clopidogrel 75 MG tablet Commonly known as:  PLAVIX Take 1 tablet (75 mg total) by mouth daily.   DULoxetine 60 MG capsule Commonly known as:  CYMBALTA Take 1 capsule (60 mg total) by mouth daily.   Fluticasone-Salmeterol 250-50 MCG/DOSE Aepb Commonly known as:  ADVAIR Inhale 1 puff into the lungs 2 (two) times daily.   furosemide 20 MG tablet Commonly known as:  LASIX Take 2 tablets (40 mg total) by mouth 2 (two) times daily. What changed:  how much to take   glucose blood test strip Use 3 times daily before meals as directed.   lactulose 10 GM/15ML solution Commonly known as:  CHRONULAC Take 15 mLs (10 g total) by mouth 2 (two) times daily as needed for mild constipation.   memantine 10 MG tablet Commonly known as:  NAMENDA Take 1 tablet (10 mg total) by mouth 2 (two) times daily.   naphazoline-pheniramine 0.025-0.3 % ophthalmic solution Commonly known as:  NAPHCON-A Place 1 drop into the left eye 4 (four) times daily as needed for eye irritation.   pantoprazole 40 MG tablet Commonly known as:   PROTONIX TAKE 1 TABLET BY MOUTH EVERY MORNING What changed:  when to take this   pregabalin 150 MG capsule Commonly known as:  LYRICA Take 1 capsule (150 mg total) by mouth 2 (two) times daily.   ROLLATOR Misc 1 each by Does not apply route daily.   rOPINIRole 0.5 MG tablet Commonly known as:  REQUIP Take 1 tablet (0.5 mg total) by mouth at bedtime.   tiZANidine 4 MG tablet Commonly known as:  ZANAFLEX Take 1 tablet (4 mg total) by mouth every 8 (eight) hours as needed for muscle spasms.   traZODone 150 MG tablet Commonly known as:  DESYREL Take 1 tablet (150 mg total) by mouth at bedtime as needed for sleep.       Contact information for follow-up providers    Charlott Rakes, MD Follow up.   Specialty:  Family Medicine Contact information: [place] [address] [phone]        [facility]. Schedule an appointment as soon as possible for a visit in 6 week(s).   Specialty:  Neurology Contact information: [place]  [place]           Contact information for after-discharge care    Destination    [facility] Preferred SNF .   Service:  Skilled Nursing Contact information: [address] [address] [phone]                 Allergies  Allergen Reactions  . Darvocet [Propoxyphene N-Acetaminophen] Hives  . Haldol [Haloperidol Decanoate] Hives  . Acetaminophen Nausea Only    Upset stomach, tolerates Hydrocodone/APAP if taken with food  . Metformin And Related Nausea And Vomiting  . Norco [Hydrocodone-Acetaminophen] Nausea And Vomiting    Tolerates if taken with food    Consultations:  Neurology  Procedures/Studies: Ct Angio Head W Or Wo Contrast  Result Date: 10/25/2017 CLINICAL DATA:  Initial evaluation for acute left-sided deficits, right-sided gaze. EXAM: CT ANGIOGRAPHY HEAD AND NECK CT PERFUSION BRAIN  TECHNIQUE: Multidetector CT imaging of the head and neck was performed using the standard protocol during bolus administration of intravenous contrast. Multiplanar CT image reconstructions and MIPs were obtained to evaluate the vascular anatomy. Carotid stenosis measurements (when applicable) are obtained utilizing NASCET criteria, using the distal internal carotid diameter as the denominator. Multiphase CT imaging of the brain was performed following IV bolus contrast injection. Subsequent parametric perfusion maps were calculated using RAPID software. CONTRAST:  20mL ISOVUE-370 IOPAMIDOL (ISOVUE-370) INJECTION 76% COMPARISON:  Prior noncontrast head CT from earlier the same day as well as prior CTA from 07/18/2016 and arteriogram from 07/20/2016. FINDINGS: CTA NECK FINDINGS Aortic arch: Visualized aortic arch of normal caliber with normal branch pattern. Moderate atherosclerotic change about the visualized arch and origin of the great vessels with ulcerated soft plaque within the distal arch. Approximate 50% stenosis at the proximal right brachiocephalic artery just distal to its origin, similar to previous. Visualized subclavian arteries patent. Right carotid system: Right common carotid artery patent to the level of the distal right CCA stent. Stent appears patent proximally, but nearly entirely occluded at its mid and distal aspect (series 5, image 70). Irregular attenuated flow is seen just distal to the stent, with subsequent occlusion (series 5, image 87). Right ICA otherwise occluded within the neck. Patent flow within the right external carotid artery and its branches. Left carotid system: Left common carotid artery patent from its origin to the bifurcation. Scattered multifocal plaque about the left bifurcation without hemodynamically significant stenosis. Left ICA patent distally to the skull base without stenosis, dissection, or occlusion. Incidental note made of left external carotid artery occlusion  at its origin, with distal reconstitution via collateralization. Vertebral arteries: Both of the vertebral arteries arise from the subclavian arteries. Left vertebral artery dominant. Focal plaque at the origin of the vertebral arteries bilaterally with approximate moderate proximal right V1 stenosis. More mild stenosis at the origin of the left vertebral artery. Scattered multifocal plaque within the V2 and V3 segments bilaterally without high-grade stenosis. Vertebral arteries are patent to the skull base. Skeleton: No acute osseus abnormality. No discrete lytic or blastic osseous lesions. Patient is edentulous. Mild-to-moderate multilevel cervical spondylolysis, stable. Other neck: No other acute soft tissue abnormality within the neck. Normal thyroid. No adenopathy. Upper chest: Visualized upper chest demonstrates no acute finding. Partially visualized lungs are clear. Review of the MIP images confirms the above findings CTA HEAD FINDINGS Anterior circulation: Right ICA occluded at the skull base. Distal reconstitution at the supraclinoid segment via flow across the circle-of-Willis. Cavernous to supraclinoid right  ICA stent grossly patent distally, not well assessed proximally. Petrous left ICA widely patent. Scattered multifocal plaque within the cavernous left ICA with approximate moderate stenosis, grossly similar to previous. ICA termini patent. A1 segments patent bilaterally. Right A1 hypoplastic. Normal anterior communicating artery. Anterior cerebral arteries patent to their distal aspects without stenosis. Right A1 slightly hypoplastic. Right M1 widely patent. Normal right MCA bifurcation. No proximal right M2 occlusion. Distal right MCA branches well perfused. Left M1 widely patent. Normal left MCA bifurcation. No proximal left M2 occlusion. Distal left MCA branches well perfused. Posterior circulation: Vertebral arteries patent to the vertebrobasilar junction without stenosis. Left PICA patent. Right  PICA not well seen. Basilar artery widely patent to its distal aspect. Superior cerebral arteries patent bilaterally. Both of the posterior cerebral arteries primarily supplied via the basilar and are widely patent to their distal aspects without stenosis. Venous sinuses: Grossly patent. Anatomic variants: None significant. Delayed phase: Not performed. Review of the MIP images confirms the above findings CT Brain Perfusion Findings: CBF (<30%) Volume: 68mL Perfusion (Tmax>6.0s) volume: 271mL Mismatch Volume: 242mL Infarction Location:Scattered watershed type infarcts seen involving the right MCA/ACA territory as well as the MCA/PCA territory as well as the deep watershed cerebral white matter. Large surrounding ischemic penumbra involving the entirety of the right MCA territory related to delayed perfusion due to the occluded right ICA stent. IMPRESSION: 1. Occluded/nearly occluded proximal right ICA stent, with little to absent flow distally within the neck. Distal reconstitution at the right ICA terminus with grossly patent flow within the distal cavernous/supraclinoid right ICA stent. Right MCA is perfused without proximal or large vessel occlusion. 2. Acute watershed type infarcts involving the right MCA/ACA and MCA/PCA territories as well as the deep cerebral white matter. Large surrounding penumbra throughout the right MCA distribution due to delayed perfusion from the occluded right ICA stent. 3. Grossly patent distal cavernous/supraclinoid right ICA stent. Evaluation for possible intra stent stenosis difficult given proximal occlusion. 4. Atheromatous plaque within the cavernous left ICA with approximate 50% stenosis, similar to previous. 5. Short-segment atheromatous stenosis of approximately 50% at the proximal innominate artery. 6. Moderate atheromatous stenoses of up to approximate 50% involving the proximal right V1 segment, with mild stenosis at the origin of the left vertebral artery. Otherwise  patent vertebrobasilar system. 7. 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 These results were communicated to Dr. Leonel Ramsay at 9:57 pmon 9/16/2019by text page via the [facility] messaging system. Electronically Signed   By: Jeannine Boga M.D.   On: 10/25/2017 22:10   Ct Angio Neck W Or Wo Contrast  Result Date: 10/25/2017 CLINICAL DATA:  Initial evaluation for acute left-sided deficits, right-sided gaze. EXAM: CT ANGIOGRAPHY HEAD AND NECK CT PERFUSION BRAIN TECHNIQUE: Multidetector CT imaging of the head and neck was performed using the standard protocol during bolus administration of intravenous contrast. Multiplanar CT image reconstructions and MIPs were obtained to evaluate the vascular anatomy. Carotid stenosis measurements (when applicable) are obtained utilizing NASCET criteria, using the distal internal carotid diameter as the denominator. Multiphase CT imaging of the brain was performed following IV bolus contrast injection. Subsequent parametric perfusion maps were calculated using RAPID software. CONTRAST:  69mL ISOVUE-370 IOPAMIDOL (ISOVUE-370) INJECTION 76% COMPARISON:  Prior noncontrast head CT from earlier the same day as well as prior CTA from 07/18/2016 and arteriogram from 07/20/2016. FINDINGS: CTA NECK FINDINGS Aortic arch: Visualized aortic arch of normal caliber with normal branch pattern. Moderate atherosclerotic change about the  visualized arch and origin of the great vessels with ulcerated soft plaque within the distal arch. Approximate 50% stenosis at the proximal right brachiocephalic artery just distal to its origin, similar to previous. Visualized subclavian arteries patent. Right carotid system: Right common carotid artery patent to the level of the distal right CCA stent. Stent appears patent proximally, but nearly entirely occluded at its mid and distal aspect (series 5, image 70). Irregular attenuated flow is seen just  distal to the stent, with subsequent occlusion (series 5, image 87). Right ICA otherwise occluded within the neck. Patent flow within the right external carotid artery and its branches. Left carotid system: Left common carotid artery patent from its origin to the bifurcation. Scattered multifocal plaque about the left bifurcation without hemodynamically significant stenosis. Left ICA patent distally to the skull base without stenosis, dissection, or occlusion. Incidental note made of left external carotid artery occlusion at its origin, with distal reconstitution via collateralization. Vertebral arteries: Both of the vertebral arteries arise from the subclavian arteries. Left vertebral artery dominant. Focal plaque at the origin of the vertebral arteries bilaterally with approximate moderate proximal right V1 stenosis. More mild stenosis at the origin of the left vertebral artery. Scattered multifocal plaque within the V2 and V3 segments bilaterally without high-grade stenosis. Vertebral arteries are patent to the skull base. Skeleton: No acute osseus abnormality. No discrete lytic or blastic osseous lesions. Patient is edentulous. Mild-to-moderate multilevel cervical spondylolysis, stable. Other neck: No other acute soft tissue abnormality within the neck. Normal thyroid. No adenopathy. Upper chest: Visualized upper chest demonstrates no acute finding. Partially visualized lungs are clear. Review of the MIP images confirms the above findings CTA HEAD FINDINGS Anterior circulation: Right ICA occluded at the skull base. Distal reconstitution at the supraclinoid segment via flow across the circle-of-Willis. Cavernous to supraclinoid right ICA stent grossly patent distally, not well assessed proximally. Petrous left ICA widely patent. Scattered multifocal plaque within the cavernous left ICA with approximate moderate stenosis, grossly similar to previous. ICA termini patent. A1 segments patent bilaterally. Right A1  hypoplastic. Normal anterior communicating artery. Anterior cerebral arteries patent to their distal aspects without stenosis. Right A1 slightly hypoplastic. Right M1 widely patent. Normal right MCA bifurcation. No proximal right M2 occlusion. Distal right MCA branches well perfused. Left M1 widely patent. Normal left MCA bifurcation. No proximal left M2 occlusion. Distal left MCA branches well perfused. Posterior circulation: Vertebral arteries patent to the vertebrobasilar junction without stenosis. Left PICA patent. Right PICA not well seen. Basilar artery widely patent to its distal aspect. Superior cerebral arteries patent bilaterally. Both of the posterior cerebral arteries primarily supplied via the basilar and are widely patent to their distal aspects without stenosis. Venous sinuses: Grossly patent. Anatomic variants: None significant. Delayed phase: Not performed. Review of the MIP images confirms the above findings CT Brain Perfusion Findings: CBF (<30%) Volume: 77mL Perfusion (Tmax>6.0s) volume: 285mL Mismatch Volume: 228mL Infarction Location:Scattered watershed type infarcts seen involving the right MCA/ACA territory as well as the MCA/PCA territory as well as the deep watershed cerebral white matter. Large surrounding ischemic penumbra involving the entirety of the right MCA territory related to delayed perfusion due to the occluded right ICA stent. IMPRESSION: 1. Occluded/nearly occluded proximal right ICA stent, with little to absent flow distally within the neck. Distal reconstitution at the right ICA terminus with grossly patent flow within the distal cavernous/supraclinoid right ICA stent. Right MCA is perfused without proximal or large vessel occlusion. 2. Acute watershed type infarcts involving the  right MCA/ACA and MCA/PCA territories as well as the deep cerebral white matter. Large surrounding penumbra throughout the right MCA distribution due to delayed perfusion from the occluded right ICA  stent. 3. Grossly patent distal cavernous/supraclinoid right ICA stent. Evaluation for possible intra stent stenosis difficult given proximal occlusion. 4. Atheromatous plaque within the cavernous left ICA with approximate 50% stenosis, similar to previous. 5. Short-segment atheromatous stenosis of approximately 50% at the proximal innominate artery. 6. Moderate atheromatous stenoses of up to approximate 50% involving the proximal right V1 segment, with mild stenosis at the origin of the left vertebral artery. Otherwise patent vertebrobasilar system. 7. 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 These results were communicated to Dr. Leonel Ramsay at 9:57 pmon 9/16/2019by text page via the [facility] messaging system. Electronically Signed   By: Jeannine Boga M.D.   On: 10/25/2017 22:10   Mr Brain Wo Contrast  Result Date: 10/26/2017 CLINICAL DATA:  Follow-up examination for acute stroke. EXAM: MRI HEAD WITHOUT CONTRAST TECHNIQUE: Multiplanar, multiecho pulse sequences of the brain and surrounding structures were obtained without intravenous contrast. COMPARISON:  Prior CTA from 10/25/2017. FINDINGS: Brain: Examination moderately degraded by motion artifact. Generalized age-related cerebral atrophy. Patchy T2/FLAIR hyperintensity within the periventricular and deep white matter both cerebral hemispheres most consistent with chronic small vessel ischemic disease, mild to moderate nature. Scattered areas of encephalomalacia involving the right cerebral hemisphere, consistent with chronic right MCA territory infarcts, mostly watershed in nature. Scattered areas of restricted diffusion seen involving the cortical gray matter within the right parietal lobe (series 5, image 65, posterior right temporal region (series 5, image 56), and right insula (series 5, image 57), consistent with acute ischemic changes. Minimal punctate cortical involvement at the posterior  right frontal region (series 5, image 68). No associated hemorrhage. Changes involve right MCA and MCA watershed zones, likely due to known right ICA stent occlusion. Additionally, there is mild diffusion abnormality involving the anterior inferior right cingulate (series 5, image 61), likely related to hypoperfusion of the right ACA due to stent occlusion. No associated hemorrhage. No other evidence for acute or subacute ischemia. Gray-white matter differentiation otherwise maintained. No evidence for acute intracranial hemorrhage. No mass lesion or midline shift. No hydrocephalus. No extra-axial fluid collection. Incidental note made of a partially empty sella. Vascular: Abnormal flow void within the right ICA to the level of the supraclinoid segment, consistent with known occlusion. Major intravascular flow voids otherwise maintained. Skull and upper cervical spine: Craniocervical junction normal. No focal marrow replacing lesion. Scalp soft tissues demonstrate no acute finding. Sinuses/Orbits: Patient status post ocular lens replacement bilaterally. Chronic left maxillary sinusitis. No significant mastoid effusion. Inner ear structures normal. Other: None. IMPRESSION: 1. Scattered areas of acute cortical ischemia involving the right MCA territory and right MCA watershed zones, most likely due to acute occlusion of proximal right ICA stent as seen on prior CTA. No associated hemorrhage. 2. Additional mild right ACA ischemic changes involving the anterior inferior right cingulate, likely due to hypoperfusion given underlying hypoplastic right ACA in the setting of stent occlusion. 3. Abnormal flow void within the right ICA to the level of the supraclinoid segment, consistent with previously identified occlusion. 4. Underlying chronic watershed right MCA territory infarcts with mild to moderate chronic small vessel ischemic disease. Electronically Signed   By: Jeannine Boga M.D.   On: 10/26/2017 01:47   Ct  Cerebral Perfusion W Contrast  Result Date: 10/25/2017 CLINICAL DATA:  Initial evaluation  for acute left-sided deficits, right-sided gaze. EXAM: CT ANGIOGRAPHY HEAD AND NECK CT PERFUSION BRAIN TECHNIQUE: Multidetector CT imaging of the head and neck was performed using the standard protocol during bolus administration of intravenous contrast. Multiplanar CT image reconstructions and MIPs were obtained to evaluate the vascular anatomy. Carotid stenosis measurements (when applicable) are obtained utilizing NASCET criteria, using the distal internal carotid diameter as the denominator. Multiphase CT imaging of the brain was performed following IV bolus contrast injection. Subsequent parametric perfusion maps were calculated using RAPID software. CONTRAST:  36mL ISOVUE-370 IOPAMIDOL (ISOVUE-370) INJECTION 76% COMPARISON:  Prior noncontrast head CT from earlier the same day as well as prior CTA from 07/18/2016 and arteriogram from 07/20/2016. FINDINGS: CTA NECK FINDINGS Aortic arch: Visualized aortic arch of normal caliber with normal branch pattern. Moderate atherosclerotic change about the visualized arch and origin of the great vessels with ulcerated soft plaque within the distal arch. Approximate 50% stenosis at the proximal right brachiocephalic artery just distal to its origin, similar to previous. Visualized subclavian arteries patent. Right carotid system: Right common carotid artery patent to the level of the distal right CCA stent. Stent appears patent proximally, but nearly entirely occluded at its mid and distal aspect (series 5, image 70). Irregular attenuated flow is seen just distal to the stent, with subsequent occlusion (series 5, image 87). Right ICA otherwise occluded within the neck. Patent flow within the right external carotid artery and its branches. Left carotid system: Left common carotid artery patent from its origin to the bifurcation. Scattered multifocal plaque about the left bifurcation  without hemodynamically significant stenosis. Left ICA patent distally to the skull base without stenosis, dissection, or occlusion. Incidental note made of left external carotid artery occlusion at its origin, with distal reconstitution via collateralization. Vertebral arteries: Both of the vertebral arteries arise from the subclavian arteries. Left vertebral artery dominant. Focal plaque at the origin of the vertebral arteries bilaterally with approximate moderate proximal right V1 stenosis. More mild stenosis at the origin of the left vertebral artery. Scattered multifocal plaque within the V2 and V3 segments bilaterally without high-grade stenosis. Vertebral arteries are patent to the skull base. Skeleton: No acute osseus abnormality. No discrete lytic or blastic osseous lesions. Patient is edentulous. Mild-to-moderate multilevel cervical spondylolysis, stable. Other neck: No other acute soft tissue abnormality within the neck. Normal thyroid. No adenopathy. Upper chest: Visualized upper chest demonstrates no acute finding. Partially visualized lungs are clear. Review of the MIP images confirms the above findings CTA HEAD FINDINGS Anterior circulation: Right ICA occluded at the skull base. Distal reconstitution at the supraclinoid segment via flow across the circle-of-Willis. Cavernous to supraclinoid right ICA stent grossly patent distally, not well assessed proximally. Petrous left ICA widely patent. Scattered multifocal plaque within the cavernous left ICA with approximate moderate stenosis, grossly similar to previous. ICA termini patent. A1 segments patent bilaterally. Right A1 hypoplastic. Normal anterior communicating artery. Anterior cerebral arteries patent to their distal aspects without stenosis. Right A1 slightly hypoplastic. Right M1 widely patent. Normal right MCA bifurcation. No proximal right M2 occlusion. Distal right MCA branches well perfused. Left M1 widely patent. Normal left MCA  bifurcation. No proximal left M2 occlusion. Distal left MCA branches well perfused. Posterior circulation: Vertebral arteries patent to the vertebrobasilar junction without stenosis. Left PICA patent. Right PICA not well seen. Basilar artery widely patent to its distal aspect. Superior cerebral arteries patent bilaterally. Both of the posterior cerebral arteries primarily supplied via the basilar and are widely patent to their  distal aspects without stenosis. Venous sinuses: Grossly patent. Anatomic variants: None significant. Delayed phase: Not performed. Review of the MIP images confirms the above findings CT Brain Perfusion Findings: CBF (<30%) Volume: 37mL Perfusion (Tmax>6.0s) volume: 263mL Mismatch Volume: 218mL Infarction Location:Scattered watershed type infarcts seen involving the right MCA/ACA territory as well as the MCA/PCA territory as well as the deep watershed cerebral white matter. Large surrounding ischemic penumbra involving the entirety of the right MCA territory related to delayed perfusion due to the occluded right ICA stent. IMPRESSION: 1. Occluded/nearly occluded proximal right ICA stent, with little to absent flow distally within the neck. Distal reconstitution at the right ICA terminus with grossly patent flow within the distal cavernous/supraclinoid right ICA stent. Right MCA is perfused without proximal or large vessel occlusion. 2. Acute watershed type infarcts involving the right MCA/ACA and MCA/PCA territories as well as the deep cerebral white matter. Large surrounding penumbra throughout the right MCA distribution due to delayed perfusion from the occluded right ICA stent. 3. Grossly patent distal cavernous/supraclinoid right ICA stent. Evaluation for possible intra stent stenosis difficult given proximal occlusion. 4. Atheromatous plaque within the cavernous left ICA with approximate 50% stenosis, similar to previous. 5. Short-segment atheromatous stenosis of approximately 50% at the  proximal innominate artery. 6. Moderate atheromatous stenoses of up to approximate 50% involving the proximal right V1 segment, with mild stenosis at the origin of the left vertebral artery. Otherwise patent vertebrobasilar system. 7. 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 These results were communicated to Dr. Leonel Ramsay at 9:57 pmon 9/16/2019by text page via the [facility] messaging system. Electronically Signed   By: Jeannine Boga M.D.   On: 10/25/2017 22:10   Ct Head Code Stroke Wo Contrast  Result Date: 10/25/2017 CLINICAL DATA:  Code stroke. Right-sided gaze, left-sided neurologic deficits. EXAM: CT HEAD WITHOUT CONTRAST TECHNIQUE: Contiguous axial images were obtained from the base of the skull through the vertex without intravenous contrast. COMPARISON:  Head CT 09/19/2017 FINDINGS: Brain: There is no mass, hemorrhage or extra-axial collection. The size and configuration of the ventricles and extra-axial CSF spaces are normal. There are 2 subcortical old right frontal lobe infarcts, unchanged. There is hypoattenuation of the periventricular white matter, most commonly indicating chronic ischemic microangiopathy. Vascular: No abnormal hyperdensity of the major intracranial arteries or dural venous sinuses. No intracranial atherosclerosis. Skull: The visualized skull base, calvarium and extracranial soft tissues are normal. Sinuses/Orbits: No fluid levels or advanced mucosal thickening of the visualized paranasal sinuses. No mastoid or middle ear effusion. The orbits are normal. ASPECTS [facility] Stroke Program Early CT Score) - Ganglionic level infarction (caudate, lentiform nuclei, internal capsule, insula, M1-M3 cortex): 7 - Supraganglionic infarction (M4-M6 cortex): 3 Total score (0-10 with 10 being normal): 10 IMPRESSION: 1. No acute hemorrhage. 2. ASPECTS is 10. 3. Old subcortical infarcts within the right frontal lobe, unchanged compared  to 09/19/2017. These results were communicated to Dr. Roland Rack at 9:13 pm on 10/25/2017 by text page via the [facility] messaging system. Electronically Signed   By: Ulyses Jarred M.D.   On: 10/25/2017 21:13   Subjective: Left arm > leg weakness is stable. Coughing up his usual amount of sputum more in the morning, has a yellow tint to it this morning. No dyspnea or chest pain or fever.  Discharge Exam: Vitals:   10/29/17 0927 10/29/17 1213  BP: 135/77 124/68  Pulse: 77 79  Resp: 20 16  Temp: (!) 97.5 F (36.4 C)  97.7 F (36.5 C)  SpO2: 95% 96%   Gen: 67 y.o. male in no distress Eyes:  Mild conjunctival injection on left, no FB. Visual acuity unchanged. Pulm: Nonlabored breathing room air. Clear. CV: Regular rate and rhythm. No murmur, rub, or gallop. No JVD, trace LE dependent edema. GI: Abdomen soft, non-tender, non-distended, with normoactive bowel sounds.  Ext: Warm, no deformities Skin: No rashes, lesions or ulcers on visualized skin.  Neuro: Alert and oriented. Left upper extremity weakness > lower extremity weakness, right normal. Sensation intact diffusely. Psych: Judgement and insight appear fair. Mood euthymic & affect congruent. Behavior is appropriate.    Labs: BNP (last 3 results) Recent Labs    09/19/17 2055 10/25/17 2100  BNP 90.2 09.6   Basic Metabolic Panel: Recent Labs  Lab 10/25/17 2100 10/25/17 2105 10/27/17 0344 10/29/17 0550  NA 142 142 144 142  K 4.2 4.2 4.0 4.0  CL 103 102 108 106  CO2 29  --  28 28  GLUCOSE 120* 116* 93 96  BUN 14 15 8 11   CREATININE 1.00 0.90 0.71 0.66  CALCIUM 9.5  --  9.7 9.7  MG  --   --  2.2  --    Liver Function Tests: Recent Labs  Lab 10/25/17 2100 10/27/17 0344  AST 16 14*  ALT 14 14  ALKPHOS 71 62  BILITOT 0.4 1.0  PROT 6.2* 5.8*  ALBUMIN 3.7 3.2*   No results for input(s): LIPASE, AMYLASE in the last 168 hours. No results for input(s): AMMONIA in the last 168 hours. CBC: Recent Labs  Lab  10/25/17 2100 10/25/17 2105 10/27/17 0344  WBC 7.1  --  5.3  NEUTROABS 4.4  --   --   HGB 14.0 14.6 13.6  HCT 45.0 43.0 43.6  MCV 94.5  --  92.4  PLT 176  --  159   Cardiac Enzymes: No results for input(s): CKTOTAL, CKMB, CKMBINDEX, TROPONINI in the last 168 hours. BNP: Invalid input(s): POCBNP CBG: Recent Labs  Lab 10/25/17 2058  GLUCAP 111*   D-Dimer No results for input(s): DDIMER in the last 72 hours. Hgb A1c No results for input(s): HGBA1C in the last 72 hours. Lipid Profile No results for input(s): CHOL, HDL, LDLCALC, TRIG, CHOLHDL, LDLDIRECT in the last 72 hours. Thyroid function studies No results for input(s): TSH, T4TOTAL, T3FREE, THYROIDAB in the last 72 hours.  Invalid input(s): FREET3 Anemia work up No results for input(s): VITAMINB12, FOLATE, FERRITIN, TIBC, IRON, RETICCTPCT in the last 72 hours. Urinalysis    Component Value Date/Time   COLORURINE YELLOW 10/26/2017 0332   APPEARANCEUR CLEAR 10/26/2017 0332   LABSPEC >1.046 (H) 10/26/2017 0332   PHURINE 7.0 10/26/2017 0332   GLUCOSEU NEGATIVE 10/26/2017 0332   HGBUR NEGATIVE 10/26/2017 0332   BILIRUBINUR NEGATIVE 10/26/2017 0332   KETONESUR NEGATIVE 10/26/2017 0332   PROTEINUR NEGATIVE 10/26/2017 0332   UROBILINOGEN 0.2 08/19/2014 2103   NITRITE NEGATIVE 10/26/2017 0332   LEUKOCYTESUR NEGATIVE 10/26/2017 0332    Microbiology No results found for this or any previous visit (from the past 240 hour(s)).  Time coordinating discharge: Approximately 40 minutes  Patrecia Pour, MD  [facility] 10/29/2017, 1:31 PM Pager [phone]

## 2017-11-04 ENCOUNTER — Telehealth: Payer: Self-pay | Admitting: Family Medicine

## 2017-11-04 NOTE — Telephone Encounter (Signed)
Call returned to [place] Switch, patient's caregiver.  She explained that she wanted to inform Dr Margarita Rana that the patient is at [facility] for at least 21 days. His appointment on 11/22/17 will be cancelled and he will need to reschedule when his discharge date is determined.  She further explained that she is no longer providing care for him but sill oversees the care that the others provide. She said that he is now incontinent of bowel and bladder.

## 2017-11-04 NOTE — Telephone Encounter (Signed)
Thanks for the update

## 2017-11-04 NOTE — Telephone Encounter (Signed)
[name] with personal care services called to notify Dr.Newlin that the patient is at [facility] on [address], the nursing facility, due to having another stroke Please follow up.[name] would also like for jane to follow up regarding this issue.

## 2017-11-08 DIAGNOSIS — L299 Pruritus, unspecified: Secondary | ICD-10-CM | POA: Diagnosis not present

## 2017-11-08 DIAGNOSIS — E1140 Type 2 diabetes mellitus with diabetic neuropathy, unspecified: Secondary | ICD-10-CM | POA: Diagnosis not present

## 2017-11-08 DIAGNOSIS — L309 Dermatitis, unspecified: Secondary | ICD-10-CM | POA: Diagnosis not present

## 2017-11-17 DIAGNOSIS — J449 Chronic obstructive pulmonary disease, unspecified: Secondary | ICD-10-CM | POA: Diagnosis not present

## 2017-11-17 DIAGNOSIS — I69354 Hemiplegia and hemiparesis following cerebral infarction affecting left non-dominant side: Secondary | ICD-10-CM | POA: Diagnosis not present

## 2017-11-17 DIAGNOSIS — I480 Paroxysmal atrial fibrillation: Secondary | ICD-10-CM | POA: Diagnosis not present

## 2017-11-17 DIAGNOSIS — M6281 Muscle weakness (generalized): Secondary | ICD-10-CM | POA: Diagnosis not present

## 2017-11-22 ENCOUNTER — Ambulatory Visit: Payer: Self-pay | Admitting: Family Medicine

## 2017-11-27 DIAGNOSIS — J449 Chronic obstructive pulmonary disease, unspecified: Secondary | ICD-10-CM | POA: Diagnosis not present

## 2017-11-27 DIAGNOSIS — I69354 Hemiplegia and hemiparesis following cerebral infarction affecting left non-dominant side: Secondary | ICD-10-CM | POA: Diagnosis not present

## 2017-11-27 DIAGNOSIS — I69398 Other sequelae of cerebral infarction: Secondary | ICD-10-CM | POA: Diagnosis not present

## 2017-11-27 DIAGNOSIS — E1151 Type 2 diabetes mellitus with diabetic peripheral angiopathy without gangrene: Secondary | ICD-10-CM | POA: Diagnosis not present

## 2017-11-27 DIAGNOSIS — I2510 Atherosclerotic heart disease of native coronary artery without angina pectoris: Secondary | ICD-10-CM | POA: Diagnosis not present

## 2017-11-27 DIAGNOSIS — G2581 Restless legs syndrome: Secondary | ICD-10-CM | POA: Diagnosis not present

## 2017-11-27 DIAGNOSIS — M109 Gout, unspecified: Secondary | ICD-10-CM | POA: Diagnosis not present

## 2017-11-27 DIAGNOSIS — I5042 Chronic combined systolic (congestive) and diastolic (congestive) heart failure: Secondary | ICD-10-CM | POA: Diagnosis not present

## 2017-11-27 DIAGNOSIS — E1140 Type 2 diabetes mellitus with diabetic neuropathy, unspecified: Secondary | ICD-10-CM | POA: Diagnosis not present

## 2017-11-27 DIAGNOSIS — I5031 Acute diastolic (congestive) heart failure: Secondary | ICD-10-CM | POA: Diagnosis not present

## 2017-11-27 DIAGNOSIS — E7849 Other hyperlipidemia: Secondary | ICD-10-CM | POA: Diagnosis not present

## 2017-11-27 DIAGNOSIS — I69328 Other speech and language deficits following cerebral infarction: Secondary | ICD-10-CM | POA: Diagnosis not present

## 2017-11-27 DIAGNOSIS — I110 Hypertensive heart disease with heart failure: Secondary | ICD-10-CM | POA: Diagnosis not present

## 2017-11-27 DIAGNOSIS — G4733 Obstructive sleep apnea (adult) (pediatric): Secondary | ICD-10-CM | POA: Diagnosis not present

## 2017-11-27 DIAGNOSIS — Z9181 History of falling: Secondary | ICD-10-CM | POA: Diagnosis not present

## 2017-11-27 DIAGNOSIS — Z96659 Presence of unspecified artificial knee joint: Secondary | ICD-10-CM | POA: Diagnosis not present

## 2017-11-27 DIAGNOSIS — I480 Paroxysmal atrial fibrillation: Secondary | ICD-10-CM | POA: Diagnosis not present

## 2017-11-27 DIAGNOSIS — E1121 Type 2 diabetes mellitus with diabetic nephropathy: Secondary | ICD-10-CM | POA: Diagnosis not present

## 2017-11-27 DIAGNOSIS — H538 Other visual disturbances: Secondary | ICD-10-CM | POA: Diagnosis not present

## 2017-11-28 ENCOUNTER — Emergency Department (HOSPITAL_COMMUNITY): Payer: Medicare Other

## 2017-11-28 ENCOUNTER — Observation Stay (HOSPITAL_COMMUNITY)
Admission: EM | Admit: 2017-11-28 | Discharge: 2017-12-02 | Disposition: A | Discharge status: Skilled Nursing Facility | Payer: Medicare Other | Attending: Internal Medicine | Admitting: Internal Medicine

## 2017-11-28 ENCOUNTER — Encounter (HOSPITAL_COMMUNITY): Payer: Self-pay | Admitting: Emergency Medicine

## 2017-11-28 DIAGNOSIS — I480 Paroxysmal atrial fibrillation: Secondary | ICD-10-CM | POA: Diagnosis not present

## 2017-11-28 DIAGNOSIS — J449 Chronic obstructive pulmonary disease, unspecified: Secondary | ICD-10-CM | POA: Diagnosis not present

## 2017-11-28 DIAGNOSIS — Z885 Allergy status to narcotic agent status: Secondary | ICD-10-CM | POA: Insufficient documentation

## 2017-11-28 DIAGNOSIS — G9340 Encephalopathy, unspecified: Secondary | ICD-10-CM

## 2017-11-28 DIAGNOSIS — F1010 Alcohol abuse, uncomplicated: Secondary | ICD-10-CM | POA: Diagnosis present

## 2017-11-28 DIAGNOSIS — I2510 Atherosclerotic heart disease of native coronary artery without angina pectoris: Secondary | ICD-10-CM | POA: Diagnosis not present

## 2017-11-28 DIAGNOSIS — F17290 Nicotine dependence, other tobacco product, uncomplicated: Secondary | ICD-10-CM | POA: Insufficient documentation

## 2017-11-28 DIAGNOSIS — I959 Hypotension, unspecified: Secondary | ICD-10-CM | POA: Diagnosis not present

## 2017-11-28 DIAGNOSIS — I5032 Chronic diastolic (congestive) heart failure: Secondary | ICD-10-CM | POA: Diagnosis not present

## 2017-11-28 DIAGNOSIS — I63231 Cerebral infarction due to unspecified occlusion or stenosis of right carotid arteries: Secondary | ICD-10-CM | POA: Diagnosis present

## 2017-11-28 DIAGNOSIS — R4182 Altered mental status, unspecified: Secondary | ICD-10-CM | POA: Diagnosis present

## 2017-11-28 DIAGNOSIS — I6521 Occlusion and stenosis of right carotid artery: Secondary | ICD-10-CM | POA: Diagnosis not present

## 2017-11-28 DIAGNOSIS — R918 Other nonspecific abnormal finding of lung field: Secondary | ICD-10-CM | POA: Diagnosis not present

## 2017-11-28 DIAGNOSIS — I252 Old myocardial infarction: Secondary | ICD-10-CM | POA: Diagnosis not present

## 2017-11-28 DIAGNOSIS — I110 Hypertensive heart disease with heart failure: Secondary | ICD-10-CM | POA: Insufficient documentation

## 2017-11-28 DIAGNOSIS — E1151 Type 2 diabetes mellitus with diabetic peripheral angiopathy without gangrene: Secondary | ICD-10-CM | POA: Insufficient documentation

## 2017-11-28 DIAGNOSIS — E785 Hyperlipidemia, unspecified: Secondary | ICD-10-CM | POA: Insufficient documentation

## 2017-11-28 DIAGNOSIS — R29898 Other symptoms and signs involving the musculoskeletal system: Secondary | ICD-10-CM

## 2017-11-28 DIAGNOSIS — Z66 Do not resuscitate: Secondary | ICD-10-CM | POA: Diagnosis not present

## 2017-11-28 DIAGNOSIS — Z79899 Other long term (current) drug therapy: Secondary | ICD-10-CM | POA: Diagnosis not present

## 2017-11-28 DIAGNOSIS — G4733 Obstructive sleep apnea (adult) (pediatric): Secondary | ICD-10-CM | POA: Insufficient documentation

## 2017-11-28 DIAGNOSIS — F419 Anxiety disorder, unspecified: Secondary | ICD-10-CM | POA: Insufficient documentation

## 2017-11-28 DIAGNOSIS — Z888 Allergy status to other drugs, medicaments and biological substances status: Secondary | ICD-10-CM | POA: Insufficient documentation

## 2017-11-28 DIAGNOSIS — Z8249 Family history of ischemic heart disease and other diseases of the circulatory system: Secondary | ICD-10-CM | POA: Insufficient documentation

## 2017-11-28 DIAGNOSIS — Z7902 Long term (current) use of antithrombotics/antiplatelets: Secondary | ICD-10-CM | POA: Insufficient documentation

## 2017-11-28 DIAGNOSIS — F329 Major depressive disorder, single episode, unspecified: Secondary | ICD-10-CM | POA: Diagnosis not present

## 2017-11-28 DIAGNOSIS — Z86718 Personal history of other venous thrombosis and embolism: Secondary | ICD-10-CM | POA: Insufficient documentation

## 2017-11-28 DIAGNOSIS — F1910 Other psychoactive substance abuse, uncomplicated: Secondary | ICD-10-CM | POA: Diagnosis present

## 2017-11-28 DIAGNOSIS — Z7982 Long term (current) use of aspirin: Secondary | ICD-10-CM | POA: Insufficient documentation

## 2017-11-28 DIAGNOSIS — R404 Transient alteration of awareness: Secondary | ICD-10-CM | POA: Diagnosis not present

## 2017-11-28 DIAGNOSIS — F17210 Nicotine dependence, cigarettes, uncomplicated: Secondary | ICD-10-CM | POA: Insufficient documentation

## 2017-11-28 DIAGNOSIS — F1410 Cocaine abuse, uncomplicated: Secondary | ICD-10-CM | POA: Diagnosis present

## 2017-11-28 DIAGNOSIS — F17200 Nicotine dependence, unspecified, uncomplicated: Secondary | ICD-10-CM | POA: Diagnosis present

## 2017-11-28 DIAGNOSIS — I69354 Hemiplegia and hemiparesis following cerebral infarction affecting left non-dominant side: Secondary | ICD-10-CM | POA: Diagnosis not present

## 2017-11-28 DIAGNOSIS — R569 Unspecified convulsions: Secondary | ICD-10-CM

## 2017-11-28 DIAGNOSIS — E119 Type 2 diabetes mellitus without complications: Secondary | ICD-10-CM

## 2017-11-28 DIAGNOSIS — F10239 Alcohol dependence with withdrawal, unspecified: Secondary | ICD-10-CM | POA: Insufficient documentation

## 2017-11-28 DIAGNOSIS — Z96659 Presence of unspecified artificial knee joint: Secondary | ICD-10-CM | POA: Insufficient documentation

## 2017-11-28 DIAGNOSIS — I639 Cerebral infarction, unspecified: Secondary | ICD-10-CM | POA: Diagnosis present

## 2017-11-28 DIAGNOSIS — R0902 Hypoxemia: Secondary | ICD-10-CM | POA: Diagnosis not present

## 2017-11-28 DIAGNOSIS — Z9989 Dependence on other enabling machines and devices: Secondary | ICD-10-CM | POA: Insufficient documentation

## 2017-11-28 DIAGNOSIS — E1142 Type 2 diabetes mellitus with diabetic polyneuropathy: Secondary | ICD-10-CM

## 2017-11-28 DIAGNOSIS — K219 Gastro-esophageal reflux disease without esophagitis: Secondary | ICD-10-CM | POA: Diagnosis not present

## 2017-11-28 DIAGNOSIS — I82409 Acute embolism and thrombosis of unspecified deep veins of unspecified lower extremity: Secondary | ICD-10-CM | POA: Diagnosis present

## 2017-11-28 DIAGNOSIS — Z743 Need for continuous supervision: Secondary | ICD-10-CM | POA: Diagnosis not present

## 2017-11-28 DIAGNOSIS — I10 Essential (primary) hypertension: Secondary | ICD-10-CM | POA: Diagnosis present

## 2017-11-28 DIAGNOSIS — I739 Peripheral vascular disease, unspecified: Secondary | ICD-10-CM | POA: Diagnosis present

## 2017-11-28 HISTORY — DX: Encephalopathy, unspecified: G93.40

## 2017-11-28 LAB — CREATININE, SERUM
CREATININE: 0.66 mg/dL (ref 0.61–1.24)
GFR calc Af Amer: >60 mL/min (ref >60)

## 2017-11-28 LAB — CBC
HEMATOCRIT: 39.6 % (ref 39.0–52.0)
HEMATOCRIT: 42 % (ref 39.0–52.0)
Hemoglobin: 12.1 g/dL — ABNORMAL LOW (ref 13.0–17.0)
Hemoglobin: 12.7 g/dL — ABNORMAL LOW (ref 13.0–17.0)
MCH: 28.8 pg (ref 26.0–34.0)
MCH: 28.1 pg (ref 26.0–34.0)
MCHC: 30.6 g/dL (ref 30.0–36.0)
MCHC: 30.2 g/dL (ref 30.0–36.0)
MCV: 94.3 fL (ref 80.0–100.0)
MCV: 92.9 fL (ref 80.0–100.0)
PLATELETS: 149 10*3/uL — AB (ref 150–400)
Platelets: 165 10*3/uL (ref 150–400)
RBC: 4.2 MIL/uL — ABNORMAL LOW (ref 4.22–5.81)
RBC: 4.52 MIL/uL (ref 4.22–5.81)
RDW: 14.7 % (ref 11.5–15.5)
RDW: 14.6 % (ref 11.5–15.5)
WBC: 5.1 10*3/uL (ref 4.0–10.5)
WBC: 7.3 10*3/uL (ref 4.0–10.5)
nRBC: 0 % (ref 0.0–0.2)
nRBC: 0 % (ref 0.0–0.2)

## 2017-11-28 LAB — COMPREHENSIVE METABOLIC PANEL
ALBUMIN: 3.2 g/dL — AB (ref 3.5–5.0)
ALT: 17 U/L (ref 0–44)
ANION GAP: 6 (ref 5–15)
AST: 19 U/L (ref 15–41)
Alkaline Phosphatase: 67 U/L (ref 38–126)
BUN: 14 mg/dL (ref 8–23)
CHLORIDE: 112 mmol/L — AB (ref 98–111)
CO2: 28 mmol/L (ref 22–32)
Calcium: 9.6 mg/dL (ref 8.9–10.3)
Creatinine, Ser: 0.77 mg/dL (ref 0.61–1.24)
GFR calc Af Amer: >60 mL/min (ref >60)
GFR calc non Af Amer: >60 mL/min (ref >60)
Glucose, Bld: 102 mg/dL — ABNORMAL HIGH (ref 70–99)
POTASSIUM: 3.8 mmol/L (ref 3.5–5.1)
Sodium: 146 mmol/L — ABNORMAL HIGH (ref 135–145)
Total Bilirubin: 0.6 mg/dL (ref 0.3–1.2)
Total Protein: 5.7 g/dL — ABNORMAL LOW (ref 6.5–8.1)

## 2017-11-28 LAB — URINALYSIS, ROUTINE W REFLEX MICROSCOPIC
Bilirubin Urine: NEGATIVE
Glucose, UA: NEGATIVE mg/dL
Ketones, ur: NEGATIVE mg/dL
NITRITE: NEGATIVE
PH: 7 (ref 5.0–8.0)
Protein, ur: NEGATIVE mg/dL
Specific Gravity, Urine: 1.008 (ref 1.005–1.030)
WBC, UA: >50 WBC/hpf — ABNORMAL HIGH (ref 0–5)

## 2017-11-28 LAB — MRSA PCR SCREENING: MRSA BY PCR: NEGATIVE

## 2017-11-28 LAB — RAPID URINE DRUG SCREEN, HOSP PERFORMED
Amphetamines: NOT DETECTED
BENZODIAZEPINES: NOT DETECTED
Barbiturates: NOT DETECTED
COCAINE: NOT DETECTED
OPIATES: NOT DETECTED
TETRAHYDROCANNABINOL: NOT DETECTED

## 2017-11-28 LAB — ACETAMINOPHEN LEVEL

## 2017-11-28 LAB — LIPASE, BLOOD: Lipase: 22 U/L (ref 11–51)

## 2017-11-28 LAB — PROTIME-INR
INR: 1.15
Prothrombin Time: 14.6 seconds (ref 11.4–15.2)

## 2017-11-28 LAB — SALICYLATE LEVEL: Salicylate Lvl: <7 mg/dL (ref 2.8–30.0)

## 2017-11-28 LAB — CBG MONITORING, ED: Glucose-Capillary: 91 mg/dL (ref 70–99)

## 2017-11-28 LAB — TSH: TSH: 0.748 u[IU]/mL (ref 0.350–4.500)

## 2017-11-28 LAB — ETHANOL: Alcohol, Ethyl (B): <10 mg/dL (ref <10)

## 2017-11-28 LAB — AMMONIA: Ammonia: 14 umol/L (ref 9–35)

## 2017-11-28 MED ORDER — CLOPIDOGREL BISULFATE 75 MG PO TABS
75.0000 mg | ORAL_TABLET | Freq: Every day | ORAL | Status: DC
  Administered 2017-11-29 – 2017-12-02 (×4): 75 mg via ORAL
  Filled 2017-11-28 (×4): qty 1

## 2017-11-28 MED ORDER — ASPIRIN EC 81 MG PO TBEC
81.0000 mg | DELAYED_RELEASE_TABLET | Freq: Every day | ORAL | Status: DC
  Administered 2017-11-29 – 2017-12-02 (×4): 81 mg via ORAL
  Filled 2017-11-28 (×4): qty 1

## 2017-11-28 MED ORDER — SODIUM CHLORIDE 0.9% FLUSH
3.0000 mL | Freq: Two times a day (BID) | INTRAVENOUS | Status: DC
  Administered 2017-11-28 – 2017-12-02 (×6): 3 mL via INTRAVENOUS

## 2017-11-28 MED ORDER — BISACODYL 10 MG RE SUPP: 10.0000 mg | Freq: Every day | RECTAL | Status: DC | PRN

## 2017-11-28 MED ORDER — PANTOPRAZOLE SODIUM 40 MG PO TBEC
40.0000 mg | DELAYED_RELEASE_TABLET | Freq: Every day | ORAL | Status: DC
  Administered 2017-11-29 – 2017-12-02 (×4): 40 mg via ORAL
  Filled 2017-11-28 (×4): qty 1

## 2017-11-28 MED ORDER — ACETAMINOPHEN 650 MG RE SUPP: 650.0000 mg | Freq: Four times a day (QID) | RECTAL | Status: DC | PRN

## 2017-11-28 MED ORDER — ACETAMINOPHEN 325 MG PO TABS: 650.0000 mg | ORAL_TABLET | Freq: Four times a day (QID) | ORAL | Status: DC | PRN

## 2017-11-28 MED ORDER — LACTULOSE 10 GM/15ML PO SOLN: 10.0000 g | Freq: Two times a day (BID) | ORAL | Status: DC | PRN

## 2017-11-28 MED ORDER — LORAZEPAM 2 MG/ML IJ SOLN: 1.0000 mg | INTRAMUSCULAR | Status: DC | PRN

## 2017-11-28 MED ORDER — ATORVASTATIN CALCIUM 20 MG PO TABS
80.0000 mg | ORAL_TABLET | Freq: Every day | ORAL | Status: DC
  Administered 2017-11-29 – 2017-12-02 (×3): 80 mg via ORAL
  Filled 2017-11-28 (×4): qty 4

## 2017-11-28 MED ORDER — LACTATED RINGERS IV SOLN
INTRAVENOUS | Status: DC
  Administered 2017-11-28: 1000 mL via INTRAVENOUS
  Administered 2017-11-29: 08:00:00 via INTRAVENOUS

## 2017-11-28 MED ORDER — HEPARIN SODIUM (PORCINE) 5000 UNIT/ML IJ SOLN
5000.0000 [IU] | Freq: Three times a day (TID) | INTRAMUSCULAR | Status: DC
  Administered 2017-11-28 – 2017-12-02 (×11): 5000 [IU] via SUBCUTANEOUS
  Filled 2017-11-28 (×10): qty 1

## 2017-11-28 MED ORDER — NALOXONE HCL 2 MG/2ML IJ SOSY
1.0000 mg | PREFILLED_SYRINGE | Freq: Once | INTRAMUSCULAR | Status: AC
  Administered 2017-11-28: 1 mg via INTRAVENOUS
  Filled 2017-11-28: qty 2

## 2017-11-28 NOTE — ED Notes (Signed)
Patient transported to CT 

## 2017-11-28 NOTE — Consult Note (Addendum)
                    NEURO HOSPITALIST CONSULT NOTE   Requestig physician: Dr. Singh  Reason for Consult: Unresponsive/AMS  History obtained from:  Patient and Chart Review  HPI:                                                                                                                                          [name] is an 67 y.o. male with PMH CVA ( residual left side deficits), COPD, cocaine abuse, Alcohol abuse, CHF, HLD, DM, HTN, DVT, PVD, MI (2000) who presented to [facility] ED from SNF after being found unresponsive.  Patient can not remember why he was brought to the ED or why he is here. He is a semi- reliable historian. He will confabulate at times. For example, during first interview with Neurology the patient stated that he "was in a tree and fell out". Per EMS he was found unresponsive at SNF. Per SNF staff, "he may have gotten into something", but they were vague when asked focused questions, according to EMS. Unknown LSN. Of note, on 10/29/2017 the patient was discharged from [facility] after admission for CVA. He had tested + for cocaine at that time. He endorses smoking 10 cigars per day and has a greater than 10 year smoking history. Denies EtOH or drug use.  ED course: BP: 156/88  BG: 102 UTI: +  Ammonia: WNL ETOH: - UDS: - CT head: no hemorrhage  Stroke history: Hx stroke/TIA       10/2017: right MCA territory and watershed infarcts secondary to occluded R ICA stent in setting of  multiple uncontrolled risk factors 07/2016 patchy multifocal acute ischemic right MCA territory infarcts - likely embolic from right ICA stent 08/2014 New Non-dominant right posterior frontal infarct secondary to hypotension in setting of severe R paraclinoid stenosis 07/2014 Non-dominant watershed infarcts likely due to hypotension with intracranial right carotid stenosis.   Past Medical History:  Diagnosis Date  . Alcohol abuse    H/O  . Anxiety   . Arthritis   . Asthma   . CAD  (coronary artery disease) 05/27/10   Cath: severe single vessell CAD left cx midportion obtuse marginal 2 to 3.  . CHF (congestive heart failure) (HCC)   . COPD (chronic obstructive pulmonary disease) (HCC)   . COPD (chronic obstructive pulmonary disease) (HCC)   . Depression   . Diabetes mellitus, type 2 (HCC) 03/13/2015  . GERD (gastroesophageal reflux disease)   . HCAP (healthcare-associated pneumonia) 10/15/2015  . History of DVT of lower extremity   . Hyperlipemia   . Hyperlipidemia   . Hypertension   . Myocardial infarction (HCC) 2000  . Orbital fracture 12/2012  . OSA on CPAP   . Peripheral vascular disease, unspecified (HCC)    08/20/10 doppler: increase in right ABI post-op.   Left ABI stable. S/P bi-fem bypass surgery  . Pneumonia 04/04/2012  . Shortness of breath   . Stroke (HCC)   . Tobacco abuse     Past Surgical History:  Procedure Laterality Date  . abdoninal ao angio & bifem angio  05/27/10   Patent graft, occluded bil stents with no retrograde flow into the hypogastric arteries. 100% occl left ant. tibial artery, 70% to 80% to 100% stenosis right superficial fem artery above adductor canal. 100 % occl right ant. tibial vessell  . Aortogram w/ PTCA  09/292003  . BACK SURGERY    . CARDIAC CATHETERIZATION  05/27/10   severe CAD left cx  . CHOLECYSTECTOMY    . ESOPHAGOGASTRIC FUNDOPLICATION    . EYE SURGERY    . FEMORAL BYPASS  08/19/10   Right Fem-Pop  . IR ANGIO INTRA EXTRACRAN SEL COM CAROTID INNOMINATE BILAT MOD SED  07/20/2016  . IR ANGIO VERTEBRAL SEL SUBCLAVIAN INNOMINATE UNI R MOD SED  07/20/2016  . IR ANGIO VERTEBRAL SEL VERTEBRAL UNI L MOD SED  07/20/2016  . IR RADIOLOGIST EVAL & MGMT  05/27/2016  . JOINT REPLACEMENT    . PERIPHERAL VASCULAR CATHETERIZATION N/A 12/10/2014   Procedure: Abdominal Aortogram;  Surgeon: Christopher S Dickson, MD;  Location: [facility];  Service: Cardiovascular;  Laterality: N/A;  . RADIOLOGY WITH ANESTHESIA N/A 02/08/2015    Procedure: RADIOLOGY WITH ANESTHESIA;  Surgeon: Sanjeev Deveshwar, MD;  Location: [facility] OR;  Service: Radiology;  Laterality: N/A;  . TEE WITHOUT CARDIOVERSION N/A 08/29/2014   Procedure: TRANSESOPHAGEAL ECHOCARDIOGRAM (TEE);  Surgeon: Peter C Nishan, MD;  Location: [facility] ENDOSCOPY;  Service: Cardiovascular;  Laterality: N/A;  . TOTAL KNEE ARTHROPLASTY      Family History  Problem Relation Age of Onset  . Heart attack Mother   . Heart failure Mother   . Cirrhosis Father   . Heart failure Brother   . Cancer Brother   . Hypertension Neg Hx        UNKNOWN  . Stroke Neg Hx        UNKNOWN        Social History:  reports that he has been smoking cigars and cigarettes. He started smoking about 56 years ago. He has a 25.00 pack-year smoking history. He has quit using smokeless tobacco.  His smokeless tobacco use included chew. He reports that he drinks alcohol. He reports that he has current or past drug history. Drug: Cocaine.  Allergies  Allergen Reactions  . Darvocet [Propoxyphene N-Acetaminophen] Hives  . Haldol [Haloperidol Decanoate] Hives  . Acetaminophen Nausea Only    Upset stomach, tolerates Hydrocodone/APAP if taken with food  . Metformin And Related Nausea And Vomiting  . Norco [Hydrocodone-Acetaminophen] Nausea And Vomiting    Tolerates if taken with food    MEDICATIONS:                                                                                                                     Current Facility-Administered Medications    Medication Dose Route Frequency Provider Last Rate Last Dose  . aspirin tablet 162.5 mg  162.5 mg Oral Daily Singh, Prashant K, MD      . atorvastatin (LIPITOR) tablet 80 mg  80 mg Oral q1800 Singh, Prashant K, MD      . clopidogrel (PLAVIX) tablet 75 mg  75 mg Oral Daily Singh, Prashant K, MD      . lactated ringers infusion   Intravenous Continuous Singh, Prashant K, MD      . lactulose (CHRONULAC) 10 GM/15ML solution 10 g  10 g Oral BID PRN Singh,  Prashant K, MD      . pantoprazole (PROTONIX) EC tablet 40 mg  40 mg Oral Daily Singh, Prashant K, MD       Current Outpatient Medications  Medication Sig Dispense Refill  . albuterol (PROAIR HFA) 108 (90 Base) MCG/ACT inhaler INHALE 2 PUFFS EVERY 6 HOURS AS NEEDED FOR WHEEZING OR SHORTNESS OF BREATH (Patient taking differently: Inhale 2 puffs into the lungs every 6 (six) hours as needed for wheezing or shortness of breath. ) 8.5 g 2  . allopurinol (ZYLOPRIM) 300 MG tablet Take 1 tablet (300 mg total) by mouth daily. 30 tablet 3  . aspirin 325 MG tablet Take 1 tablet (325 mg total) by mouth daily. (Patient taking differently: Take 81 mg by mouth daily. )    . atorvastatin (LIPITOR) 80 MG tablet Take 1 tablet (80 mg total) by mouth daily at 6 PM. 30 tablet 3  . carvedilol (COREG) 12.5 MG tablet Take 1 tablet (12.5 mg total) by mouth 2 (two) times daily with a meal. 60 tablet 3  . clopidogrel (PLAVIX) 75 MG tablet Take 1 tablet (75 mg total) by mouth daily. 30 tablet 3  . DULoxetine (CYMBALTA) 60 MG capsule Take 1 capsule (60 mg total) by mouth daily. 30 capsule 3  . Fluticasone-Salmeterol (ADVAIR DISKUS) 250-50 MCG/DOSE AEPB Inhale 1 puff into the lungs 2 (two) times daily. 1 each 3  . furosemide (LASIX) 20 MG tablet Take 2 tablets (40 mg total) by mouth 2 (two) times daily. (Patient taking differently: Take 20 mg by mouth 2 (two) times daily. ) 360 tablet 0  . hydrocortisone cream 1 % Apply 1 application topically 2 (two) times daily.    . hydrOXYzine (ATARAX/VISTARIL) 25 MG tablet Take 25 mg by mouth every 6 (six) hours as needed.    . lactulose (CHRONULAC) 10 GM/15ML solution Take 15 mLs (10 g total) by mouth 2 (two) times daily as needed for mild constipation. 236 mL 1  . memantine (NAMENDA) 10 MG tablet Take 1 tablet (10 mg total) by mouth 2 (two) times daily. 60 tablet 3  . naphazoline-pheniramine (NAPHCON-A) 0.025-0.3 % ophthalmic solution Place 1 drop into the left eye 4 (four) times daily  as needed for eye irritation.  0  . pantoprazole (PROTONIX) 40 MG tablet TAKE 1 TABLET BY MOUTH EVERY MORNING (Patient taking differently: Take 40 mg by mouth daily. ) 30 tablet 2  . pregabalin (LYRICA) 150 MG capsule Take 1 capsule (150 mg total) by mouth 2 (two) times daily. 180 capsule 0  . rOPINIRole (REQUIP) 0.5 MG tablet Take 1 tablet (0.5 mg total) by mouth at bedtime. 30 tablet 3  . tiZANidine (ZANAFLEX) 4 MG tablet Take 1 tablet (4 mg total) by mouth every 8 (eight) hours as needed for muscle spasms. 90 tablet 3  . ACCU-CHEK SOFTCLIX LANCETS lancets Use 3 times daily before meals 100 each 5  .   Blood Glucose Monitoring Suppl (ACCU-CHEK AVIVA) device Use 3 times daily before meals. 1 each 0  . glucose blood (ACCU-CHEK AVIVA) test strip Use 3 times daily before meals as directed. 100 each 12  . Lancet Devices (ACCU-CHEK SOFTCLIX) lancets Use 3 times daily before meals 1 each 5  . Misc. Devices (ROLLATOR) MISC 1 each by Does not apply route daily. 1 each 0  . traZODone (DESYREL) 150 MG tablet Take 1 tablet (150 mg total) by mouth at bedtime as needed for sleep. (Patient not taking: Reported on 11/28/2017) 30 tablet 3      ROS:                                                                                                                                       ROS was performed and is negative except as noted in HPI    Blood pressure 116/64, pulse 64, temperature (!) 97.5 F (36.4 C), temperature source Rectal, resp. rate 19, SpO2 95 %.   General Examination:                                                                                                       Physical Exam  HEENT-  Normocephalic, no lesions, without obvious abnormality.  Normal external eye and conjunctiva. Edentulous  Cardiovascular- S1-S2 audible, pulses palpable throughout   Lungs-no rhonchi or wheezing noted, no excessive working breathing.  Saturations within normal limits on RA Abdomen- All 4 quadrants palpated  and nontender Extremities- Warm, dry and intact Musculoskeletal- left hand contracture, muscle wasting, fingers chronically flexed Skin-warm and dry, no hyperpigmentation, vitiligo, or suspicious lesions  Neurological Examination Mental Status: Alert, drowsy, oriented to age/ month/year/ city. thought content appropriate, but does confabulate at times.  Speech fluent without evidence of aphasia. Dysarthria noted. Patient tends to mumble and is difficult to understand at times. Able to follow  commands without difficulty. Has difficulty recalling recent events such as 3 specific items over time after practice. Was able to recall 1of 3 of words and confabulated the other 2 of 3. Did not recognize examiner after re-entry approximately 5 mins after leaving.  Cranial Nerves: II:  Visual fields grossly normal, ( patient will state he can't see, but most likely d/t left eyelid ptosis). He is poorly cooperative with this and other portions of the exam.  III,IV, VI: ptosis of left eye lid ( not new per patient), extra-ocular motions intact bilaterally. PERRL V,VII: smile asymmetric, slight left facial droop. facial   light touch sensation normal bilaterally VIII: hearing normal bilaterally IX,X: No hoarseness or hypophonia XI: Left side decreased shoulder shrug XII: midline tongue extension Motor: Right : Upper extremity   5/5  Left:     Upper extremity   4/5  Lower extremity   5/5   Lower extremity   4/5 Tone and bulk: LUE with spastic tone Sensory:  light touch intact throughout, bilaterally Deep Tendon Reflexes: LUE 3+ biceps, LLE 2+ knee jerk. RUE +2 biceps, RLE 2+ knee jerk. Plantars: Right: downgoing   Left: downgoing Cerebellar: normal finger-to-nose on right side, unable to perform on left, unable to perform HTS Gait: deferred   Lab Results: Basic Metabolic Panel: Recent Labs  Lab 11/28/17 1436  NA 146*  K 3.8  CL 112*  CO2 28  GLUCOSE 102*  BUN 14  CREATININE 0.77  CALCIUM 9.6     CBC: Recent Labs  Lab 11/28/17 1436  WBC 5.1  HGB 12.1*  HCT 39.6  MCV 94.3  PLT 149*   Imaging: Ct Head Wo Contrast  Result Date: 11/28/2017 CLINICAL DATA:  Altered mental status of unclear cause EXAM: CT HEAD WITHOUT CONTRAST TECHNIQUE: Contiguous axial images were obtained from the base of the skull through the vertex without intravenous contrast. COMPARISON:  10/25/2017 FINDINGS: Brain: Remote right MCA/watershed distribution infarcts. No evidence of acute infarct, acute hemorrhage, hydrocephalus, or collection. Vascular: Right ICA stent. Skull: No acute finding. Sinuses/Orbits: Prior blowout fracture of the left orbital floor with plating. Bilateral cataract resection. No acute finding IMPRESSION: 1. No acute finding. 2. Remote right cerebral infarcts. Electronically Signed   By: Jonathon  Watts M.D.   On: 11/28/2017 16:19   Dg Chest Portable 1 View  Result Date: 11/28/2017 CLINICAL DATA:  Patient found unresponsive today. EXAM: PORTABLE CHEST 1 VIEW COMPARISON:  Single-view of the chest 09/19/2017. PA and lateral chest 04/13/2017. FINDINGS: There is mild bibasilar atelectasis. No pneumothorax or pleural effusion. Nodular opacity measuring 1.2 cm projects between the right seventh and eighth ribs. It was likely present on the most recent study although more difficult to visualize. Aortic atherosclerosis is noted. No acute bony abnormality. IMPRESSION: No acute disease. 1.2 cm nodular opacity projects in the right mid lung. Nonemergent chest CT with IV contrast could be used for further evaluation after the patient's acute episode has passed. Electronically Signed   By: Thomas  Dalessio M.D.   On: 11/28/2017 15:32    Assessment:  67 year old caucasian male with PMH CVA ( residual left side deficits), COPD, cocaine abuse, alcohol abuse, CHF, HLD, DM, HTN, DVT, PVD, MI (2000) who presented to [facility] ED from SNF after being found unresponsive.  1. Unknown LSN.  2. CT head: negative for  hemorrhage. 3. DDx is broad and includes Lewy body dementia with cognitive fluctuation, other dementia with cognitive fluctuation, toxic/metabolic disturbance, infection, seizure, effects of possible illicit drug use and Korsakoff syndrome   Recommendations: - EEG - MRI - TSH, B12, RPR, ESR, ammonia, LFTs, thiamine level - Treat UTI - Thiamine 100 mg po qd versus high dose thiamine protocol IV  Jessica Williams, MSN, NP-C [facility] [phone]   I have seen and examined the patient. I have formulated the assessment and recommendations, which were discussed with the Neurology team.  Electronically signed: Dr.   11/28/2017, 5:09 PM         

## 2017-11-28 NOTE — ED Notes (Signed)
Verified condom cath secure, no urine in bag at this time

## 2017-11-28 NOTE — ED Provider Notes (Signed)
[facility] EMERGENCY DEPARTMENT Provider Note   CSN: [id number] Arrival date & time: 11/28/17  1410     History   Chief Complaint Chief Complaint  Patient presents with  . Altered Mental Status   Level 5 caveat: Altered mental status  HPI [name] is a 67 y.o. male.  HPI Patient is a 67 year old male presents from the nursing home after being found unresponsive in his nursing home.  He was sitting in a wheelchair.  Last seen normal this morning.  History of stroke and discharged in the triad hospitalist service 30 days ago..  Presents responding to painful stimuli.  Baseline weakness on the left side from his stroke.  Opens his eyes to pain.  Pinpoint pupils.  No response to Narcan.    Past Medical History:  Diagnosis Date  . Alcohol abuse    H/O  . Anxiety   . Arthritis   . Asthma   . CAD (coronary artery disease) 05/27/10   Cath: severe single vessell CAD left cx midportion obtuse marginal 2 to 3.  [name] CHF (congestive heart failure) ([place])   . COPD (chronic obstructive pulmonary disease) ([place])   . COPD (chronic obstructive pulmonary disease) ([place])   . Depression   . Diabetes mellitus, type 2 ([place]) 03/13/2015  . GERD (gastroesophageal reflux disease)   . HCAP (healthcare-associated pneumonia) 10/15/2015  . History of DVT of lower extremity   . Hyperlipemia   . Hyperlipidemia   . Hypertension   . Myocardial infarction ([place]) 2000  . Orbital fracture 12/2012  . OSA on CPAP   . Peripheral vascular disease, unspecified ([place])    08/20/10 doppler: increase in right ABI post-op. Left ABI stable. S/P bi-fem bypass surgery  . Pneumonia 04/04/2012  . Shortness of breath   . Stroke ([place])   . Tobacco abuse     Patient Active Problem List   Diagnosis Date Noted  . Left shoulder pain 09/28/2017  . Cerebral thrombosis with cerebral infarction 07/18/2016  . Acute ischemic stroke ([place])   . Left hemiplegia ([place])   . Stroke-like symptoms 07/17/2016  .  Insomnia 01/29/2016  . Depression 01/29/2016  . Alcohol abuse with intoxication ([place]) 10/16/2015  . Psoriasiform dermatitis 07/12/2015  . Dementia due to another medical condition ([place]) 07/12/2015  . Urinary incontinence 03/14/2015  . Diabetes mellitus, type 2 ([place]) 03/13/2015  . Hemiparesis affecting left side as late effect of cerebrovascular accident ([place]) 02/18/2015  . Cerebrovascular accident (CVA) due to stenosis of right carotid artery ([place])   . Coronary artery disease involving native coronary artery of native heart without angina pectoris   . Tachypnea   . Thrombocytopenia ([place])   . History of stroke   . Stroke (cerebrum) ([place]) 02/06/2015  . Arterial ischemic stroke, MCA (middle cerebral artery), right, acute ([place])   . DOE (dyspnea on exertion) 11/23/2014  . PAF (paroxysmal atrial fibrillation) ([place]) 11/23/2014  . HLD (hyperlipidemia) 11/01/2014  . Tobacco use disorder 11/01/2014  . Diastolic CHF, acute on chronic (HCC) 11/01/2014  . Cerebral infarction due to stenosis of right carotid artery ([place]) 11/01/2014  . Carotid stenosis 11/01/2014  . Hyperkalemia 10/24/2014  . H/O total knee replacement 10/24/2014  . Essential hypertension   . Polysubstance abuse ([place]) 08/28/2014  . Cocaine abuse ([place])   . Intracranial carotid stenosis   . Headache around the eyes 08/27/2014  . Acute CVA (cerebrovascular accident) ([place]) 08/27/2014  . CVA (cerebral vascular accident) ([place]) 08/26/2014  . History of DVT (  deep vein thrombosis)   . Alcoholism ([place])   . Embolic stroke ([place])   . Cerebral infarction due to unspecified mechanism   . Hypotension   . AKI (acute kidney injury) ([place])   . Stroke ([place])   . CVA (cerebral infarction) 08/01/2014  . Alcohol dependence with withdrawal with complication ([place]) 85/27/7824  . DVT (deep venous thrombosis) ([place]) 02/22/2014  . Lower extremity edema 02/21/2014  . Chronic diastolic congestive heart failure ([place]) 11/21/2013  . CAD (coronary artery  disease) 11/21/2013  . Alcohol abuse 11/21/2013  . GERD (gastroesophageal reflux disease) 08/10/2013  . Gout 08/10/2013  . Tobacco abuse 07/26/2013  . Trichiasis without entropion 04/16/2013  . Dizziness 04/10/2013  . Eyelid lesion 01/24/2013  . Enophthalmos due to trauma 01/19/2013  . Medial orbital wall fracture 01/19/2013  . Closed blow-out fracture of floor of orbit ([place]) 01/19/2013  . Binocular vision disorder with diplopia 01/03/2013  . Fracture of inferior orbital wall ([place]) 01/03/2013  . Fracture of orbital floor ([place]) 01/03/2013  . Pain of left eye 01/03/2013  . Peripheral vascular disease ([place]) 07/27/2012  . COPD (chronic obstructive pulmonary disease) ([place]) 04/04/2012  . HTN (hypertension) 04/04/2012    Past Surgical History:  Procedure Laterality Date  . abdoninal ao angio & bifem angio  05/27/10   Patent graft, occluded bil stents with no retrograde flow into the hypogastric arteries. 100% occl left ant. tibial artery, 70% to 80% to 100% stenosis right superficial fem artery above adductor canal. 100 % occl right ant. tibial vessell  . Aortogram w/ PTCA  09/292003  . BACK SURGERY    . CARDIAC CATHETERIZATION  05/27/10   severe CAD left cx  . CHOLECYSTECTOMY    . ESOPHAGOGASTRIC FUNDOPLICATION    . EYE SURGERY    . FEMORAL BYPASS  08/19/10   Right Fem-Pop  . IR ANGIO INTRA EXTRACRAN SEL COM CAROTID INNOMINATE BILAT MOD SED  07/20/2016  . IR ANGIO VERTEBRAL SEL SUBCLAVIAN INNOMINATE UNI R MOD SED  07/20/2016  . IR ANGIO VERTEBRAL SEL VERTEBRAL UNI L MOD SED  07/20/2016  . IR RADIOLOGIST EVAL & MGMT  05/27/2016  . JOINT REPLACEMENT    . PERIPHERAL VASCULAR CATHETERIZATION N/A 12/10/2014   Procedure: Abdominal Aortogram;  Surgeon: Angelia Mould, MD;  Location: [facility];  Service: Cardiovascular;  Laterality: N/A;  . RADIOLOGY WITH ANESTHESIA N/A 02/08/2015   Procedure: RADIOLOGY WITH ANESTHESIA;  Surgeon: Luanne Bras, MD;  Location: [facility];  Service:  Radiology;  Laterality: N/A;  . TEE WITHOUT CARDIOVERSION N/A 08/29/2014   Procedure: TRANSESOPHAGEAL ECHOCARDIOGRAM (TEE);  Surgeon: Josue Hector, MD;  Location[facility] ENDOSCOPY;  Service: Cardiovascular;  Laterality: N/A;  . TOTAL KNEE ARTHROPLASTY          Home Medications    Prior to Admission medications   Medication Sig Start Date End Date Taking? Authorizing Provider  ACCU-CHEK SOFTCLIX LANCETS lancets Use 3 times daily before meals 09/17/15   Charlott Rakes, MD  albuterol (PROAIR HFA) 108 (90 Base) MCG/ACT inhaler INHALE 2 PUFFS EVERY 6 HOURS AS NEEDED FOR WHEEZING OR SHORTNESS OF BREATH Patient taking differently: Inhale 2 puffs into the lungs every 6 (six) hours as needed for wheezing or shortness of breath.  10/05/17   Charlott Rakes, MD  allopurinol (ZYLOPRIM) 300 MG tablet Take 1 tablet (300 mg total) by mouth daily. 08/19/17   Charlott Rakes, MD  aspirin 325 MG tablet Take 1 tablet (325 mg total) by mouth daily. 10/30/17  Patrecia Pour, MD  atorvastatin (LIPITOR) 80 MG tablet Take 1 tablet (80 mg total) by mouth daily at 6 PM. 08/19/17   Charlott Rakes, MD  Blood Glucose Monitoring Suppl (ACCU-CHEK AVIVA) device Use 3 times daily before meals. 03/13/15   Charlott Rakes, MD  carvedilol (COREG) 12.5 MG tablet Take 1 tablet (12.5 mg total) by mouth 2 (two) times daily with a meal. 08/19/17   Charlott Rakes, MD  clopidogrel (PLAVIX) 75 MG tablet Take 1 tablet (75 mg total) by mouth daily. 08/19/17   Charlott Rakes, MD  DULoxetine (CYMBALTA) 60 MG capsule Take 1 capsule (60 mg total) by mouth daily. 08/19/17   Charlott Rakes, MD  Fluticasone-Salmeterol (ADVAIR DISKUS) 250-50 MCG/DOSE AEPB Inhale 1 puff into the lungs 2 (two) times daily. Patient not taking: Reported on 10/26/2017 08/19/17   Charlott Rakes, MD  furosemide (LASIX) 20 MG tablet Take 2 tablets (40 mg total) by mouth 2 (two) times daily. Patient taking differently: Take 60 mg by mouth 2 (two) times daily.  10/08/17   Charlott Rakes, MD  glucose blood (ACCU-CHEK AVIVA) test strip Use 3 times daily before meals as directed. 01/29/16   Charlott Rakes, MD  lactulose (CHRONULAC) 10 GM/15ML solution Take 15 mLs (10 g total) by mouth 2 (two) times daily as needed for mild constipation. 08/19/17   Charlott Rakes, MD  Lancet Devices [facility]) lancets Use 3 times daily before meals 09/27/15   Charlott Rakes, MD  memantine (NAMENDA) 10 MG tablet Take 1 tablet (10 mg total) by mouth 2 (two) times daily. Patient not taking: Reported on 10/26/2017 08/19/17   Charlott Rakes, MD  Misc. Devices (ROLLATOR) MISC 1 each by Does not apply route daily. 08/19/17   Charlott Rakes, MD  naphazoline-pheniramine (NAPHCON-A) 0.025-0.3 % ophthalmic solution Place 1 drop into the left eye 4 (four) times daily as needed for eye irritation. 10/29/17   Patrecia Pour, MD  pantoprazole (PROTONIX) 40 MG tablet TAKE 1 TABLET BY MOUTH EVERY MORNING Patient taking differently: Take 40 mg by mouth daily.  08/17/17   Charlott Rakes, MD  pregabalin (LYRICA) 150 MG capsule Take 1 capsule (150 mg total) by mouth 2 (two) times daily. 10/08/17   Charlott Rakes, MD  rOPINIRole (REQUIP) 0.5 MG tablet Take 1 tablet (0.5 mg total) by mouth at bedtime. 08/19/17   Charlott Rakes, MD  tiZANidine (ZANAFLEX) 4 MG tablet Take 1 tablet (4 mg total) by mouth every 8 (eight) hours as needed for muscle spasms. 08/19/17   Charlott Rakes, MD  traZODone (DESYREL) 150 MG tablet Take 1 tablet (150 mg total) by mouth at bedtime as needed for sleep. 08/19/17   Charlott Rakes, MD    Family History Family History  Problem Relation Age of Onset  . Heart attack Mother   . Heart failure Mother   . Cirrhosis Father   . Heart failure Brother   . Cancer Brother   . Hypertension Neg Hx        UNKNOWN  . Stroke Neg Hx        UNKNOWN    Social History Social History   Tobacco Use  . Smoking status: Current Every Day Smoker    Packs/day: 0.50    Years: 50.00    Pack  years: 25.00    Types: Cigars, Cigarettes    Start date: 02/09/1961  . Smokeless tobacco: Former Systems developer    Types: Chew  . Tobacco comment: 2 ppd, full flavor  Substance Use Topics  .  Alcohol use: Yes  . Drug use: Yes    Types: Cocaine      Allergies   Darvocet [propoxyphene n-acetaminophen]; Haldol [haloperidol decanoate]; Acetaminophen; Metformin and related; and Norco [hydrocodone-acetaminophen]   Review of Systems Review of Systems  Unable to perform ROS: Mental status change     Physical Exam Updated Vital Signs BP 116/64   Pulse 64   Temp (!) 97.5 F (36.4 C) (Rectal)   Resp 19   SpO2 95%   Physical Exam  Constitutional: He appears well-developed and well-nourished.  HENT:  Head: Normocephalic and atraumatic.  Eyes: EOM are normal.  Neck: Normal range of motion.  Cardiovascular: Normal rate, regular rhythm and normal heart sounds.  Pulmonary/Chest: Effort normal and breath sounds normal. No respiratory distress.  Abdominal: Soft. He exhibits no distension. There is no tenderness.  Musculoskeletal: Normal range of motion.  Neurological:  Opens eyes to pain.  Moves right arm and right leg pain.  Skin: Skin is warm and dry.  Psychiatric: He has a normal mood and affect. Judgment normal.  Nursing note and vitals reviewed.    ED Treatments / Results  Labs (all labs ordered are listed, but only abnormal results are displayed) Labs Reviewed  CBC - Abnormal; Notable for the following components:      Result Value   RBC 4.20 (*)    Hemoglobin 12.1 (*)    Platelets 149 (*)    All other components within normal limits  COMPREHENSIVE METABOLIC PANEL - Abnormal; Notable for the following components:   Sodium 146 (*)    Chloride 112 (*)    Glucose, Bld 102 (*)    Total Protein 5.7 (*)    Albumin 3.2 (*)    All other components within normal limits  ACETAMINOPHEN LEVEL - Abnormal; Notable for the following components:   Acetaminophen (Tylenol), Serum <10 (*)     All other components within normal limits  LIPASE, BLOOD  ETHANOL  AMMONIA  SALICYLATE LEVEL  RAPID URINE DRUG SCREEN, HOSP PERFORMED  CBG MONITORING, ED    EKG None  Radiology Ct Head Wo Contrast  Result Date: 11/28/2017 CLINICAL DATA:  Altered mental status of unclear cause EXAM: CT HEAD WITHOUT CONTRAST TECHNIQUE: Contiguous axial images were obtained from the base of the skull through the vertex without intravenous contrast. COMPARISON:  10/25/2017 FINDINGS: Brain: Remote right MCA/watershed distribution infarcts. No evidence of acute infarct, acute hemorrhage, hydrocephalus, or collection. Vascular: Right ICA stent. Skull: No acute finding. Sinuses/Orbits: Prior blowout fracture of the left orbital floor with plating. Bilateral cataract resection. No acute finding IMPRESSION: 1. No acute finding. 2. Remote right cerebral infarcts. Electronically Signed   By: Monte Fantasia M.D.   On: 11/28/2017 16:19   Dg Chest Portable 1 View  Result Date: 11/28/2017 CLINICAL DATA:  Patient found unresponsive today. EXAM: PORTABLE CHEST 1 VIEW COMPARISON:  Single-view of the chest 09/19/2017. PA and lateral chest 04/13/2017. FINDINGS: There is mild bibasilar atelectasis. No pneumothorax or pleural effusion. Nodular opacity measuring 1.2 cm projects between the right seventh and eighth ribs. It was likely present on the most recent study although more difficult to visualize. Aortic atherosclerosis is noted. No acute bony abnormality. IMPRESSION: No acute disease. 1.2 cm nodular opacity projects in the right mid lung. Nonemergent chest CT with IV contrast could be used for further evaluation after the patient's acute episode has passed. Electronically Signed   By: Inge Rise M.D.   On: 11/28/2017 15:32    Procedures Procedures (  including critical care time)  Medications Ordered in ED Medications  naloxone (NARCAN) injection 1 mg (1 mg Intravenous Given 11/28/17 1430)     Initial Impression  / Assessment and Plan / ED Course  I have reviewed the triage vital signs and the nursing notes.  Pertinent labs & imaging results that were available during my care of the patient were reviewed by me and considered in my medical decision making (see chart for details).    Mental status improving here in the emergency department.  Still with mild encephalopathy.  Patient with dysarthric speech.  Patient will need to be admitted for acute encephalopathy.  May represent seizure with postictal state.  Could represent new stroke.  Not a candidate for TPA given last known well time.   Final Clinical Impressions(s) / ED Diagnoses   Final diagnoses:  None    ED Discharge Orders    None       Jola Schmidt, MD 11/28/17 1650

## 2017-11-28 NOTE — ED Triage Notes (Signed)
Per EMS:  Pt found by staff at SNF to be unresponsive and responding to sternal rub only.  EMS reports upon arrival pt in wheelchair, with staff reporting pt may have "gotten into something".  No LSN.  Upon arrival to  ED pt presenting with pinpoint pupils.  Pt also has some paralysis to left sided.  EMS vitals:  CBG 153, Sp02 100 RA.

## 2017-11-28 NOTE — ED Notes (Signed)
ED TO INPATIENT HANDOFF REPORT  Name/Age/Gender [name] 67 y.o. male  Code Status Code Status History    Date Active Date Inactive Code Status Order ID Comments User Context   10/25/2017 2327 10/29/2017 2317 DNR [id number]  Ivor Costa, MD ED   10/25/2017 2304 10/25/2017 2327 Full Code [id number]  Ivor Costa, MD ED   07/17/2016 2233 07/21/2016 1932 DNR [id number]  Mercy Riding, MD Inpatient   10/15/2015 1724 10/16/2015 2008 Full Code [id number]  Norman Herrlich, MD ED   02/08/2015 1423 02/13/2015 2027 Full Code [id number]  Luanne Bras, MD Inpatient   02/06/2015 1754 02/08/2015 1423 Full Code [id number]  Radene Gunning, NP Inpatient   12/10/2014 1034 12/10/2014 1908 Full Code [id number]  Angelia Mould, MD Inpatient   08/26/2014 2217 08/29/2014 2041 DNR [id number]  Frazier Richards, MD Inpatient   08/19/2014 2319 08/20/2014 2227 DNR [id number]  Leone Brand, MD Inpatient   08/01/2014 2243 08/03/2014 2132 DNR [id number]  Mariel Aloe, MD Inpatient   06/18/2014 2322 06/20/2014 1416 Full Code [id number]  Lacretia Leigh, MD ED   04/10/2013 0336 04/12/2013 1909 DNR [id number]  Timmothy Euler, MD Inpatient   04/04/2012 1555 04/06/2012 1407 Full Code [id number]  Charlynne Cousins, MD Inpatient      Home/SNF/Other  Chief Complaint altered  Level of Care/Admitting Diagnosis ED Disposition    ED Disposition Condition [place]: [place] [100100]  Level of Care: Telemetry [5]  Diagnosis: Encephalopathy acute [768115]  Admitting Physician: Octavio Graves  Attending Physician: [place], [place]  Bed request comments: 5W  PT Class (Do Not Modify): Observation [104]  PT Acc Code (Do Not Modify): Observation [10022]       Medical History Past Medical History:  Diagnosis Date  . Alcohol abuse    H/O  . Anxiety   . Arthritis   . Asthma   . CAD (coronary artery disease) 05/27/10   Cath: severe single vessell CAD left cx midportion  obtuse marginal 2 to 3.  [name] CHF (congestive heart failure) ([place])   . COPD (chronic obstructive pulmonary disease) ([place])   . COPD (chronic obstructive pulmonary disease) ([place])   . Depression   . Diabetes mellitus, type 2 ([place]) 03/13/2015  . GERD (gastroesophageal reflux disease)   . HCAP (healthcare-associated pneumonia) 10/15/2015  . History of DVT of lower extremity   . Hyperlipemia   . Hyperlipidemia   . Hypertension   . Myocardial infarction ([place]) 2000  . Orbital fracture 12/2012  . OSA on CPAP   . Peripheral vascular disease, unspecified ([place])    08/20/10 doppler: increase in right ABI post-op. Left ABI stable. S/P bi-fem bypass surgery  . Pneumonia 04/04/2012  . Shortness of breath   . Stroke ([place])   . Tobacco abuse     Allergies Allergies  Allergen Reactions  . Darvocet [Propoxyphene N-Acetaminophen] Hives  . Haldol [Haloperidol Decanoate] Hives  . Acetaminophen Nausea Only    Upset stomach, tolerates Hydrocodone/APAP if taken with food  . Metformin And Related Nausea And Vomiting  . Norco [Hydrocodone-Acetaminophen] Nausea And Vomiting    Tolerates if taken with food    IV Location/Drains/Wounds Patient Lines/Drains/Airways Status   Active Line/Drains/Airways    Name:   Placement date:   Placement time:   Site:   Days:   Peripheral IV 11/28/17 Left Hand   11/28/17    1426    Hand   less  than 1   Peripheral IV 11/28/17 Right;Upper Forearm   11/28/17    1443    Forearm   less than 1   External Urinary Catheter   11/28/17    1437    -   less than 1          Labs/Imaging Results for orders placed or performed during the hospital encounter of 11/28/17 (from the past 48 hour(s))  CBC     Status: Abnormal   Collection Time: 11/28/17  2:36 PM  Result Value Ref Range   WBC 5.1 4.0 - 10.5 K/uL   RBC 4.20 (L) 4.22 - 5.81 MIL/uL   Hemoglobin 12.1 (L) 13.0 - 17.0 g/dL   HCT 39.6 39.0 - 52.0 %   MCV 94.3 80.0 - 100.0 fL   MCH 28.8 26.0 - 34.0 pg   MCHC 30.6 30.0 - 36.0  g/dL   RDW 14.7 11.5 - 15.5 %   Platelets 149 (L) 150 - 400 K/uL   nRBC 0.0 0.0 - 0.2 %    Comment: Performed at [facility], [place] [address] [place], [place] [address]  Comprehensive metabolic panel     Status: Abnormal   Collection Time: 11/28/17  2:36 PM  Result Value Ref Range   Sodium 146 (H) 135 - 145 mmol/L   Potassium 3.8 3.5 - 5.1 mmol/L   Chloride 112 (H) 98 - 111 mmol/L   CO2 28 22 - 32 mmol/L   Glucose, Bld 102 (H) 70 - 99 mg/dL   BUN 14 8 - 23 mg/dL   Creatinine, Ser 0.77 0.61 - 1.24 mg/dL   Calcium 9.6 8.9 - 10.3 mg/dL   Total Protein 5.7 (L) 6.5 - 8.1 g/dL   Albumin 3.2 (L) 3.5 - 5.0 g/dL   AST 19 15 - 41 U/L   ALT 17 0 - 44 U/L   Alkaline Phosphatase 67 38 - 126 U/L   Total Bilirubin 0.6 0.3 - 1.2 mg/dL   GFR calc non Af Amer >60 >60 mL/min   GFR calc Af Amer >60 >60 mL/min    Comment: (NOTE) The eGFR has been calculated using the CKD EPI equation. This calculation has not been validated in all clinical situations. eGFR's persistently <60 mL/min signify possible Chronic Kidney Disease.    Anion gap 6 5 - 15    Comment: Performed at [facility] [address] [place], [place] [address]  Lipase, blood     Status: None   Collection Time: 11/28/17  2:36 PM  Result Value Ref Range   Lipase 22 11 - 51 U/L    Comment: Performed at [facility] [address] [place], [place] [address]  Ethanol     Status: None   Collection Time: 11/28/17  2:36 PM  Result Value Ref Range   Alcohol, Ethyl (B) <10 <10 mg/dL    Comment: (NOTE) Lowest detectable limit for serum alcohol is 10 mg/dL. For medical purposes only. Performed at [facility], [place] [address] [place], [place] [address]   Ammonia     Status: None   Collection Time: 11/28/17  2:36 PM  Result Value Ref Range   Ammonia 14 9 - 35 umol/L    Comment: Performed at [facility], [place] [address], [place], [place] [address]  Acetaminophen level     Status: Abnormal   Collection  Time: 11/28/17  2:36 PM  Result Value Ref Range   Acetaminophen (Tylenol),  Serum <10 (L) 10 - 30 ug/mL    Comment: (NOTE) Therapeutic concentrations vary significantly. A range of 10-30 ug/mL  may be an effective concentration for many patients. However, some  are best treated at concentrations outside of this range. Acetaminophen concentrations >150 ug/mL at 4 hours after ingestion  and >50 ug/mL at 12 hours after ingestion are often associated with  toxic reactions. Performed at [facility], [place] [address] [place], [place] [address]   Salicylate level     Status: None   Collection Time: 11/28/17  2:36 PM  Result Value Ref Range   Salicylate Lvl <8.6 2.8 - 30.0 mg/dL    Comment: Performed at [facility] [address] [place], [place] [address]  CBG monitoring, ED     Status: None   Collection Time: 11/28/17  2:41 PM  Result Value Ref Range   Glucose-Capillary 91 70 - 99 mg/dL  Rapid urine drug screen (hospital performed)     Status: None   Collection Time: 11/28/17  4:34 PM  Result Value Ref Range   Opiates NONE DETECTED NONE DETECTED   Cocaine NONE DETECTED NONE DETECTED   Benzodiazepines NONE DETECTED NONE DETECTED   Amphetamines NONE DETECTED NONE DETECTED   Tetrahydrocannabinol NONE DETECTED NONE DETECTED   Barbiturates NONE DETECTED NONE DETECTED    Comment: (NOTE) DRUG SCREEN FOR MEDICAL PURPOSES ONLY.  IF CONFIRMATION IS NEEDED FOR ANY PURPOSE, NOTIFY LAB WITHIN 5 DAYS. LOWEST DETECTABLE LIMITS FOR URINE DRUG SCREEN Drug Class                     Cutoff (ng/mL) Amphetamine and metabolites    1000 Barbiturate and metabolites    200 Benzodiazepine                 962 Tricyclics and metabolites     300 Opiates and metabolites        300 Cocaine and metabolites        300 THC                            50 Performed at [facility], [place] [address] [place], [place] [address]    Ct Head Wo Contrast  Result Date: 11/28/2017 CLINICAL DATA:   Altered mental status of unclear cause EXAM: CT HEAD WITHOUT CONTRAST TECHNIQUE: Contiguous axial images were obtained from the base of the skull through the vertex without intravenous contrast. COMPARISON:  10/25/2017 FINDINGS: Brain: Remote right MCA/watershed distribution infarcts. No evidence of acute infarct, acute hemorrhage, hydrocephalus, or collection. Vascular: Right ICA stent. Skull: No acute finding. Sinuses/Orbits: Prior blowout fracture of the left orbital floor with plating. Bilateral cataract resection. No acute finding IMPRESSION: 1. No acute finding. 2. Remote right cerebral infarcts. Electronically Signed   By: Monte Fantasia M.D.   On: 11/28/2017 16:19   Dg Chest Portable 1 View  Result Date: 11/28/2017 CLINICAL DATA:  Patient found unresponsive today. EXAM: PORTABLE CHEST 1 VIEW COMPARISON:  Single-view of the chest 09/19/2017. PA and lateral chest 04/13/2017. FINDINGS: There is mild bibasilar atelectasis. No pneumothorax or pleural effusion. Nodular opacity measuring 1.2 cm projects between the right seventh and eighth ribs. It was likely present on the most recent study although more difficult to visualize. Aortic atherosclerosis is noted. No acute bony abnormality. IMPRESSION: No acute disease. 1.2 cm nodular opacity projects in the right mid lung. Nonemergent chest CT with IV  contrast could be used for further evaluation after the patient's acute episode has passed. Electronically Signed   By: Inge Rise M.D.   On: 11/28/2017 15:32    Pending Labs Unresulted Labs (From admission, onward)    Start     Ordered   11/28/17 1714  Prolactin  Once,   R     11/28/17 1713   11/28/17 1713  TSH  Once,   R     11/28/17 1713   11/28/17 1653  Urinalysis, Routine w reflex microscopic  Once,   R     11/28/17 1652   Signed and Held  CBC  (heparin)  Once,   R    Comments:  Baseline for heparin therapy IF NOT ALREADY DRAWN.  Notify MD if PLT < 100 K.    Signed and Held   Signed and  Held  Creatinine, serum  (heparin)  Once,   R    Comments:  Baseline for heparin therapy IF NOT ALREADY DRAWN.    Signed and Held   Signed and Held  Protime-INR  Once,   R     Signed and Held   Signed and Held  Comprehensive metabolic panel  Tomorrow morning,   R     Signed and Held   Signed and Held  CBC  Tomorrow morning,   R     Signed and Held          Vitals/Pain Today's Vitals   11/28/17 1452 11/28/17 1500 11/28/17 1515 11/28/17 1530  BP: (!) 95/53 (!) 100/51 105/65 116/64  Pulse: 67 68 70 64  Resp: (!) 24 (!) _0 Temp:      TempSrc:      SpO2: 97% 97% 100% 95%    Isolation Precautions No active isolations  Medications Medications  lactated ringers infusion (has no administration in time range)  aspirin tablet 162.5 mg (has no administration in time range)  atorvastatin (LIPITOR) tablet 80 mg (has no administration in time range)  clopidogrel (PLAVIX) tablet 75 mg (has no administration in time range)  lactulose (CHRONULAC) 10 GM/15ML solution 10 g (has no administration in time range)  pantoprazole (PROTONIX) EC tablet 40 mg (has no administration in time range)  naloxone (NARCAN) injection 1 mg (1 mg Intravenous Given 11/28/17 1430)

## 2017-11-28 NOTE — H&P (Addendum)
TRH H&P   Patient Demographics:    [name], is a 67 y.o. male  MRN: [id number]   DOB - 08-21-1950  Admit Date - 11/28/2017  Outpatient Primary MD for the patient is Charlott Rakes, MD   Patient coming from: SNF  Chief Complaint  Patient presents with  . Altered Mental Status      HPI:    [name]  is a 67 y.o. male, with past medical history of alcohol abuse, smoking, cocaine abuse who was recently admitted for an embolic right MCA CVA causing left-sided hemiparesis most likely due to right carotid artery stenosis who also has history of hypertension, CAD, COPD, dyslipidemia, DVT was brought in by EMS from SNF where he was found to have a sudden change in mental status.    EMS were told by the SNF staff that patient might have taken something but they were not sure what, he came to the ER obtunded had pinpoint pupils, head CT was unremarkable, he received Narcan with some improvement in his mental status.  By the time I was called patient is starting to wake up, he does not recall why he was brought here, he has no subjective complaints.  ER physician has consulted neurology and requested me to admit the patient to complete his decrease in mental status work-up.    Patient currently appears to be in no distress, he is oriented x2 but overall still somnolent, has no new subjective complaints except his ongoing left-sided weakness and has no recollection of why he was brought here.    Review of systems:    A full 10 point Review of Systems was done, except as stated above, all other Review of Systems were negative.   With Past History of the following :    Past Medical History:  Diagnosis Date    . Alcohol abuse    H/O  . Anxiety   . Arthritis   . Asthma   . CAD (coronary artery disease) 05/27/10   Cath: severe single vessell CAD left cx midportion obtuse marginal 2 to 3.  [name] CHF (congestive heart failure) ([place])   . COPD (chronic obstructive pulmonary disease) ([place])   . COPD (chronic obstructive pulmonary disease) ([place])   . Depression   . Diabetes mellitus, type 2 ([place]) 03/13/2015  . GERD (gastroesophageal reflux  disease)   . HCAP (healthcare-associated pneumonia) 10/15/2015  . History of DVT of lower extremity   . Hyperlipemia   . Hyperlipidemia   . Hypertension   . Myocardial infarction ([place]) 2000  . Orbital fracture 12/2012  . OSA on CPAP   . Peripheral vascular disease, unspecified ([place])    08/20/10 doppler: increase in right ABI post-op. Left ABI stable. S/P bi-fem bypass surgery  . Pneumonia 04/04/2012  . Shortness of breath   . Stroke ([place])   . Tobacco abuse       Past Surgical History:  Procedure Laterality Date  . abdoninal ao angio & bifem angio  05/27/10   Patent graft, occluded bil stents with no retrograde flow into the hypogastric arteries. 100% occl left ant. tibial artery, 70% to 80% to 100% stenosis right superficial fem artery above adductor canal. 100 % occl right ant. tibial vessell  . Aortogram w/ PTCA  09/292003  . BACK SURGERY    . CARDIAC CATHETERIZATION  05/27/10   severe CAD left cx  . CHOLECYSTECTOMY    . ESOPHAGOGASTRIC FUNDOPLICATION    . EYE SURGERY    . FEMORAL BYPASS  08/19/10   Right Fem-Pop  . IR ANGIO INTRA EXTRACRAN SEL COM CAROTID INNOMINATE BILAT MOD SED  07/20/2016  . IR ANGIO VERTEBRAL SEL SUBCLAVIAN INNOMINATE UNI R MOD SED  07/20/2016  . IR ANGIO VERTEBRAL SEL VERTEBRAL UNI L MOD SED  07/20/2016  . IR RADIOLOGIST EVAL & MGMT  05/27/2016  . JOINT REPLACEMENT    . PERIPHERAL VASCULAR CATHETERIZATION N/A 12/10/2014   Procedure: Abdominal Aortogram;  Surgeon: Angelia Mould, MD;  Location: [facility];  Service:  Cardiovascular;  Laterality: N/A;  . RADIOLOGY WITH ANESTHESIA N/A 02/08/2015   Procedure: RADIOLOGY WITH ANESTHESIA;  Surgeon: Luanne Bras, MD;  Location: [facility];  Service: Radiology;  Laterality: N/A;  . TEE WITHOUT CARDIOVERSION N/A 08/29/2014   Procedure: TRANSESOPHAGEAL ECHOCARDIOGRAM (TEE);  Surgeon: Josue Hector, MD;  Location: [facility];  Service: Cardiovascular;  Laterality: N/A;  . TOTAL KNEE ARTHROPLASTY        Social History:     Social History   Tobacco Use  . Smoking status: Current Every Day Smoker    Packs/day: 0.50    Years: 50.00    Pack years: 25.00    Types: Cigars, Cigarettes    Start date: 02/09/1961  . Smokeless tobacco: Former Systems developer    Types: Chew  . Tobacco comment: 2 ppd, full flavor  Substance Use Topics  . Alcohol use: Yes         Family History :     Family History  Problem Relation Age of Onset  . Heart attack Mother   . Heart failure Mother   . Cirrhosis Father   . Heart failure Brother   . Cancer Brother   . Hypertension Neg Hx        UNKNOWN  . Stroke Neg Hx        UNKNOWN       Home Medications:   Prior to Admission medications   Medication Sig Start Date End Date Taking? Authorizing Provider  albuterol (PROAIR HFA) 108 (90 Base) MCG/ACT inhaler INHALE 2 PUFFS EVERY 6 HOURS AS NEEDED FOR WHEEZING OR SHORTNESS OF BREATH Patient taking differently: Inhale 2 puffs into the lungs every 6 (six) hours as needed for wheezing or shortness of breath.  10/05/17  Yes Newlin, Enobong, MD  allopurinol (ZYLOPRIM) 300 MG tablet Take 1 tablet (  300 mg total) by mouth daily. 08/19/17  Yes Charlott Rakes, MD  aspirin 325 MG tablet Take 1 tablet (325 mg total) by mouth daily. Patient taking differently: Take 81 mg by mouth daily.  10/30/17  Yes Patrecia Pour, MD  atorvastatin (LIPITOR) 80 MG tablet Take 1 tablet (80 mg total) by mouth daily at 6 PM. 08/19/17  Yes Newlin, Enobong, MD  carvedilol (COREG) 12.5 MG tablet Take 1 tablet (12.5 mg  total) by mouth 2 (two) times daily with a meal. 08/19/17  Yes Newlin, Enobong, MD  clopidogrel (PLAVIX) 75 MG tablet Take 1 tablet (75 mg total) by mouth daily. 08/19/17  Yes Charlott Rakes, MD  DULoxetine (CYMBALTA) 60 MG capsule Take 1 capsule (60 mg total) by mouth daily. 08/19/17  Yes Newlin, Charlane Ferretti, MD  Fluticasone-Salmeterol (ADVAIR DISKUS) 250-50 MCG/DOSE AEPB Inhale 1 puff into the lungs 2 (two) times daily. 08/19/17  Yes Charlott Rakes, MD  furosemide (LASIX) 20 MG tablet Take 2 tablets (40 mg total) by mouth 2 (two) times daily. Patient taking differently: Take 20 mg by mouth 2 (two) times daily.  10/08/17  Yes Charlott Rakes, MD  hydrocortisone cream 1 % Apply 1 application topically 2 (two) times daily.   Yes [provider]  hydrOXYzine (ATARAX/VISTARIL) 25 MG tablet Take 25 mg by mouth every 6 (six) hours as needed.   Yes [provider]  lactulose (CHRONULAC) 10 GM/15ML solution Take 15 mLs (10 g total) by mouth 2 (two) times daily as needed for mild constipation. 08/19/17  Yes Charlott Rakes, MD  memantine (NAMENDA) 10 MG tablet Take 1 tablet (10 mg total) by mouth 2 (two) times daily. 08/19/17  Yes Newlin, Charlane Ferretti, MD  naphazoline-pheniramine (NAPHCON-A) 0.025-0.3 % ophthalmic solution Place 1 drop into the left eye 4 (four) times daily as needed for eye irritation. 10/29/17  Yes Patrecia Pour, MD  pantoprazole (PROTONIX) 40 MG tablet TAKE 1 TABLET BY MOUTH EVERY MORNING Patient taking differently: Take 40 mg by mouth daily.  08/17/17  Yes Charlott Rakes, MD  pregabalin (LYRICA) 150 MG capsule Take 1 capsule (150 mg total) by mouth 2 (two) times daily. 10/08/17  Yes Newlin, Charlane Ferretti, MD  rOPINIRole (REQUIP) 0.5 MG tablet Take 1 tablet (0.5 mg total) by mouth at bedtime. 08/19/17  Yes Charlott Rakes, MD  tiZANidine (ZANAFLEX) 4 MG tablet Take 1 tablet (4 mg total) by mouth every 8 (eight) hours as needed for muscle spasms. 08/19/17  Yes Charlott Rakes, MD  ACCU-CHEK  SOFTCLIX LANCETS lancets Use 3 times daily before meals 09/17/15   Charlott Rakes, MD  Blood Glucose Monitoring Suppl (ACCU-CHEK AVIVA) device Use 3 times daily before meals. 03/13/15   Charlott Rakes, MD  glucose blood (ACCU-CHEK AVIVA) test strip Use 3 times daily before meals as directed. 01/29/16   Charlott Rakes, MD  Lancet Devices [facility]) lancets Use 3 times daily before meals 09/27/15   Charlott Rakes, MD  Misc. Devices (ROLLATOR) MISC 1 each by Does not apply route daily. 08/19/17   Charlott Rakes, MD  traZODone (DESYREL) 150 MG tablet Take 1 tablet (150 mg total) by mouth at bedtime as needed for sleep. Patient not taking: Reported on 11/28/2017 08/19/17   Charlott Rakes, MD     Allergies:     Allergies  Allergen Reactions  . Darvocet [Propoxyphene N-Acetaminophen] Hives  . Haldol [Haloperidol Decanoate] Hives  . Acetaminophen Nausea Only    Upset stomach, tolerates Hydrocodone/APAP if taken with food  . Metformin And Related  Nausea And Vomiting  . Norco [Hydrocodone-Acetaminophen] Nausea And Vomiting    Tolerates if taken with food     Physical Exam:   Vitals  Blood pressure 116/64, pulse 64, temperature (!) 97.5 F (36.4 C), temperature source Rectal, resp. rate 19, SpO2 95 %.   1. General obese elderly white gentleman who appears to be poorly kept, somnolent but able to answer basic questions and follow basic commands lying in hospital bed in no apparent distress,  2.  Moves right-sided extremities easily, strength much reduced on the left side at best 1-2/5.  3. No F.N deficits, ALL C.Nerves Intact, Sensation intact all 4 extremities, Plantars down going.  4. Ears and Eyes appear Normal, Conjunctivae clear, PERRLA. Moist Oral Mucosa.  5. Supple Neck, No JVD, No cervical lymphadenopathy appriciated, No Carotid Bruits.  6. Symmetrical Chest wall movement, Good air movement bilaterally, CTAB.  7. RRR, No Gallops, Rubs or Murmurs, No Parasternal  Heave.  8. Positive Bowel Sounds, Abdomen Soft, No tenderness, No organomegaly appriciated,No rebound -guarding or rigidity.  9.  No Cyanosis, Normal Skin Turgor, No Skin Rash or Bruise.  Does have trace leg edema,  10. Good muscle tone,  joints appear normal , no effusions, Normal ROM.  11. No Palpable Lymph Nodes in Neck or Axillae      Data Review:    CBC Recent Labs  Lab 11/28/17 1436  WBC 5.1  HGB 12.1*  HCT 39.6  PLT 149*  MCV 94.3  MCH 28.8  MCHC 30.6  RDW 14.7   ------------------------------------------------------------------------------------------------------------------  Chemistries  Recent Labs  Lab 11/28/17 1436  NA 146*  K 3.8  CL 112*  CO2 28  GLUCOSE 102*  BUN 14  CREATININE 0.77  CALCIUM 9.6  AST 19  ALT 17  ALKPHOS 67  BILITOT 0.6   ------------------------------------------------------------------------------------------------------------------ CrCl cannot be calculated (Unknown ideal weight.). ------------------------------------------------------------------------------------------------------------------ No results for input(s): TSH, T4TOTAL, T3FREE, THYROIDAB in the last 72 hours.  Invalid input(s): FREET3  Coagulation profile No results for input(s): INR, PROTIME in the last 168 hours. ------------------------------------------------------------------------------------------------------------------- No results for input(s): DDIMER in the last 72 hours. -------------------------------------------------------------------------------------------------------------------  Cardiac Enzymes No results for input(s): CKMB, TROPONINI, MYOGLOBIN in the last 168 hours.  Invalid input(s): CK ------------------------------------------------------------------------------------------------------------------    Component Value Date/Time   BNP 35.6 10/25/2017 2100    Lab Results  Component Value Date   HGBA1C 5.5 10/26/2017     ---------------------------------------------------------------------------------------------------------------  Urinalysis    Component Value Date/Time   COLORURINE YELLOW 10/26/2017 0332   APPEARANCEUR CLEAR 10/26/2017 0332   LABSPEC >1.046 (H) 10/26/2017 0332   PHURINE 7.0 10/26/2017 0332   GLUCOSEU NEGATIVE 10/26/2017 0332   HGBUR NEGATIVE 10/26/2017 0332   BILIRUBINUR NEGATIVE 10/26/2017 0332   KETONESUR NEGATIVE 10/26/2017 0332   PROTEINUR NEGATIVE 10/26/2017 0332   UROBILINOGEN 0.2 08/19/2014 2103   NITRITE NEGATIVE 10/26/2017 0332   LEUKOCYTESUR NEGATIVE 10/26/2017 0332    ----------------------------------------------------------------------------------------------------------------   Imaging Results:    Ct Head Wo Contrast  Result Date: 11/28/2017 CLINICAL DATA:  Altered mental status of unclear cause EXAM: CT HEAD WITHOUT CONTRAST TECHNIQUE: Contiguous axial images were obtained from the base of the skull through the vertex without intravenous contrast. COMPARISON:  10/25/2017 FINDINGS: Brain: Remote right MCA/watershed distribution infarcts. No evidence of acute infarct, acute hemorrhage, hydrocephalus, or collection. Vascular: Right ICA stent. Skull: No acute finding. Sinuses/Orbits: Prior blowout fracture of the left orbital floor with plating. Bilateral cataract resection. No acute finding IMPRESSION: 1. No acute finding. 2. Remote  right cerebral infarcts. Electronically Signed   By: Monte Fantasia M.D.   On: 11/28/2017 16:19   Dg Chest Portable 1 View  Result Date: 11/28/2017 CLINICAL DATA:  Patient found unresponsive today. EXAM: PORTABLE CHEST 1 VIEW COMPARISON:  Single-view of the chest 09/19/2017. PA and lateral chest 04/13/2017. FINDINGS: There is mild bibasilar atelectasis. No pneumothorax or pleural effusion. Nodular opacity measuring 1.2 cm projects between the right seventh and eighth ribs. It was likely present on the most recent study although more  difficult to visualize. Aortic atherosclerosis is noted. No acute bony abnormality. IMPRESSION: No acute disease. 1.2 cm nodular opacity projects in the right mid lung. Nonemergent chest CT with IV contrast could be used for further evaluation after the patient's acute episode has passed. Electronically Signed   By: Inge Rise M.D.   On: 11/28/2017 15:32    My personal review of EKG: Rhythm NSR, 73 bpm, no acute ST changes   Assessment & Plan:     1.  Acute encephalopathy.  Reason unclear.  Apparently this happened suddenly at SNF, urine tox screen negative here, he denies using alcohol recently or doing any drugs, he denies taking any new medications or abusing any medications.    Current head CT negative.  Patient could have had a seizure, another stroke will be a possibility as well, although he responded somewhat to Narcan and had pinpoint pupils upon arrival his tox screen is negative.  At this time he will be kept in 23-hour observation on a telemetry bed, will check a EEG and a prolactin level along with TSH.  Neurochecks every 2 hours, PRN Ativan for any seizure activity, continue dual antiplatelet therapy and statin for secondary prevention for recent stroke, further work-up per neurology.    For now n.p.o. except medications until cleared by speech and neurology.  Hold potentially offending medications which could be combination of Lyrica, Flexeril and trazodone.  2.  Recent embolic right MCA stroke with left-sided hemiparesis, most likely due to right carotid artery disease.  Renew dual antiplatelet therapy and statin for secondary prevention, PT OT and speech consult.  Neuro to see.  Kindly see #1 above.  If a new stroke is suspected MRI could be done but will defer that to neurology.  3.  History of alcohol abuse, cocaine abuse, smoking.  Denies doing any recently, urine tox screen negative, supportive care and monitor.  4.  History of right carotid artery disease (stent) with  recent embolic stroke.  For now treatment as per #2 above with dual antiplatelet therapy and statin, will require vascular surgery follow-up soon after discharge.  5.  Dyslipidemia.  On statin.  6.  History of COPD.  No acute issue.  Supportive care PRN nebulizer treatments and oxygen if needed.  7.  Chronic diastolic CHF.  Recent EF 60% on echocardiogram done on 10/27/2017.  Likely mild dehydration, hold diuretic gentle LR and monitor.  8.  History of GERD.  On PPI.  9.  History of CAD/PAD/Right-sided carotid artery stenosis.  No acute issues, EKG nonacute, continue dual antiplatelet therapy and statin for secondary prevention.    DVT Prophylaxis Heparin    AM Labs Ordered, also please review Full Orders  Family Communication: Admission, patients condition and plan of care including tests being ordered have been discussed with the patient who indicates understanding and agree with the plan and Code Status.  Code Status DNR  Likely DC to  SNF  Condition GUARDED    Consults called:  Neuro by ER    Admission status: Obs    Time spent in minutes : 35   Lala Lund M.D on 11/28/2017 at 5:19 PM  To page go to [email or web address] - password [facility]

## 2017-11-28 NOTE — Progress Notes (Signed)
Unable to perform full neuro check ; patient sleepy. Will continue to monitor.

## 2017-11-28 NOTE — Progress Notes (Signed)
Patient trasfered from ED to room 06 via stretcher; alert and oriented x 1, drowsy; no signs of pain or other discomfort; IV saline locked in L hand and RUA;  fluids were started at 75cc/hr per MD order, no family at bedside; unable to perform swallowing test due to drowsy. Patient placed on high fall risk; tele box 13 per MD order. Will continue to monitor the patient.

## 2017-11-29 ENCOUNTER — Inpatient Hospital Stay (HOSPITAL_COMMUNITY): Payer: Medicare Other

## 2017-11-29 ENCOUNTER — Observation Stay (HOSPITAL_COMMUNITY): Payer: Medicare Other

## 2017-11-29 ENCOUNTER — Other Ambulatory Visit: Payer: Self-pay

## 2017-11-29 ENCOUNTER — Encounter (HOSPITAL_COMMUNITY): Payer: Self-pay | Admitting: General Practice

## 2017-11-29 DIAGNOSIS — R4020 Unspecified coma: Secondary | ICD-10-CM | POA: Diagnosis not present

## 2017-11-29 DIAGNOSIS — G9340 Encephalopathy, unspecified: Secondary | ICD-10-CM | POA: Diagnosis not present

## 2017-11-29 DIAGNOSIS — R4182 Altered mental status, unspecified: Secondary | ICD-10-CM | POA: Diagnosis present

## 2017-11-29 DIAGNOSIS — M19012 Primary osteoarthritis, left shoulder: Secondary | ICD-10-CM | POA: Diagnosis not present

## 2017-11-29 LAB — CBC
HEMATOCRIT: 43.3 % (ref 39.0–52.0)
Hemoglobin: 13 g/dL (ref 13.0–17.0)
MCH: 27.8 pg (ref 26.0–34.0)
MCHC: 30 g/dL (ref 30.0–36.0)
MCV: 92.5 fL (ref 80.0–100.0)
Platelets: 158 10*3/uL (ref 150–400)
RBC: 4.68 MIL/uL (ref 4.22–5.81)
RDW: 14.8 % (ref 11.5–15.5)
WBC: 7.9 10*3/uL (ref 4.0–10.5)
nRBC: 0 % (ref 0.0–0.2)

## 2017-11-29 LAB — COMPREHENSIVE METABOLIC PANEL
ALT: 17 U/L (ref 0–44)
AST: 19 U/L (ref 15–41)
Albumin: 3.4 g/dL — ABNORMAL LOW (ref 3.5–5.0)
Alkaline Phosphatase: 74 U/L (ref 38–126)
Anion gap: 8 (ref 5–15)
BILIRUBIN TOTAL: 0.9 mg/dL (ref 0.3–1.2)
BUN: 10 mg/dL (ref 8–23)
CHLORIDE: 112 mmol/L — AB (ref 98–111)
CO2: 27 mmol/L (ref 22–32)
CREATININE: 0.67 mg/dL (ref 0.61–1.24)
Calcium: 9.6 mg/dL (ref 8.9–10.3)
GFR calc Af Amer: >60 mL/min (ref >60)
Glucose, Bld: 108 mg/dL — ABNORMAL HIGH (ref 70–99)
Potassium: 3.6 mmol/L (ref 3.5–5.1)
Sodium: 147 mmol/L — ABNORMAL HIGH (ref 135–145)
Total Protein: 6.1 g/dL — ABNORMAL LOW (ref 6.5–8.1)

## 2017-11-29 LAB — VITAMIN B12: Vitamin B-12: 173 pg/mL — ABNORMAL LOW (ref 180–914)

## 2017-11-29 LAB — GLUCOSE, CAPILLARY: Glucose-Capillary: 111 mg/dL — ABNORMAL HIGH (ref 70–99)

## 2017-11-29 LAB — PROLACTIN: Prolactin: 27.1 ng/mL — ABNORMAL HIGH (ref 4.0–15.2)

## 2017-11-29 MED ORDER — STROKE: EARLY STAGES OF RECOVERY BOOK
Freq: Once | Status: DC
  Filled 2017-11-29: qty 1

## 2017-11-29 MED ORDER — DEXTROSE 5 % AND 0.45 % NACL IV BOLUS
500.0000 mL | Freq: Once | INTRAVENOUS | Status: AC
  Administered 2017-11-29: 500 mL via INTRAVENOUS

## 2017-11-29 MED ORDER — ENSURE ENLIVE PO LIQD
237.0000 mL | Freq: Two times a day (BID) | ORAL | Status: DC
  Administered 2017-11-30 – 2017-12-02 (×6): 237 mL via ORAL

## 2017-11-29 MED ORDER — SODIUM CHLORIDE 0.9 % IV SOLN
1.0000 g | INTRAVENOUS | Status: DC
  Administered 2017-11-29 – 2017-12-01 (×3): 1 g via INTRAVENOUS
  Filled 2017-11-29 (×3): qty 10

## 2017-11-29 NOTE — Evaluation (Signed)
Occupational Therapy Evaluation Patient Details Name: [name] MRN: [id number] DOB: 05-05-1950 Today's Date: 11/29/2017    History of Present Illness [name]  is a 67 y.o. male, with past medical history of alcohol abuse, smoking, cocaine abuse who was recently admitted for an embolic right MCA CVA causing left-sided hemiparesis most likely due to right carotid artery stenosis who also has history of hypertension, CAD, COPD, dyslipidemia, DVT was brought in by EMS from SNF where he was found to have a sudden change in mental status.   Clinical Impression   PTA Pt at SNF, working with therapy but unsure of true functional status. Pt is currently mod A +2 for safety with transfers (watch for impulsivity), requires assist for all BUE tasks (opening containers, cutting food etc). Pt will require continued OT in the acute setting and afterwards at the SNF level. Next session to focus on transfers, functional seated task (grooming or eating), and establish HEP for LUE.     Follow Up Recommendations  SNF;Supervision/Assistance - 24 hour    Equipment Recommendations  Other (comment)(defer to next venue)    Recommendations for Other Services       Precautions / Restrictions Precautions Precautions: Fall Restrictions Weight Bearing Restrictions: No      Mobility Bed Mobility Overal bed mobility: Needs Assistance Bed Mobility: Supine to Sit     Supine to sit: Mod assist;+2 for physical assistance     General bed mobility comments: mod A needed for B LE's (L>R) off bed and elevate trunk into sitting. Pt able to manage balance EOB once sitting  Transfers Overall transfer level: Needs assistance Equipment used: None Transfers: Sit to/from [facility] Sit to Stand: Mod assist;+2 physical assistance Stand pivot transfers: Mod assist;+2 physical assistance       General transfer comment: B HHA for sit<>stand and SPT with decreased control on L side and  L lean    Balance Overall balance assessment: Needs assistance;History of Falls Sitting-balance support: No upper extremity supported;Feet supported Sitting balance-Leahy Scale: Good     Standing balance support: Bilateral upper extremity supported;During functional activity Standing balance-Leahy Scale: Poor Standing balance comment: static standing required +2 min A for safety                           ADL either performed or assessed with clinical judgement   ADL Overall ADL's : Needs assistance/impaired Eating/Feeding: Sitting;Moderate assistance Eating/Feeding Details (indicate cue type and reason): Pt required assistance to make sure foods were bite size and open all containers Grooming: Minimal assistance;Sitting Grooming Details (indicate cue type and reason): for BUE tasks Upper Body Bathing: Minimal assistance   Lower Body Bathing: Moderate assistance   Upper Body Dressing : Maximal assistance;Sitting Upper Body Dressing Details (indicate cue type and reason): educated to dress LUE first Lower Body Dressing: Maximal assistance   Toilet Transfer: Moderate assistance;+2 for physical assistance;+2 for safety/equipment;Stand-pivot Toilet Transfer Details (indicate cue type and reason): simulated through recliner transfer [place] and Hygiene: Maximal assistance Toileting - Clothing Manipulation Details (indicate cue type and reason): Pt currently with condom cath     Functional mobility during ADLs: Moderate assistance;+2 for physical assistance;+2 for safety/equipment General ADL Comments: Pt with DECREASED function in LUE, impulsive, and decreased awareness of deficits     Vision         Perception     Praxis      Pertinent Vitals/Pain Pain Assessment: Faces  Faces Pain Scale: Hurts little more Pain Location: L shoulder (subluxation) Pain Descriptors / Indicators: Constant;Aching Pain Intervention(s): Limited activity within  patient's tolerance;Monitored during session;Other (comment)(requested sling from RN)     Hand Dominance Right   Extremity/Trunk Assessment Upper Extremity Assessment Upper Extremity Assessment: LUE deficits/detail LUE Deficits / Details: thumb and index finger have slight activation, 0 wrist elbow and shoulder - shoulder subluxed and painful - asked RN for sling LUE Sensation: WNL LUE Coordination: decreased fine motor;decreased gross motor   Lower Extremity Assessment Lower Extremity Assessment: Defer to PT evaluation LLE Deficits / Details: hip flex 2+/5, knee ext 3/5, knee flex 3/5, apraxia noted in standing LLE Sensation: decreased light touch;decreased proprioception LLE Coordination: decreased fine motor;decreased gross motor   Cervical / Trunk Assessment Cervical / Trunk Assessment: Normal   Communication Communication Communication: No difficulties   Cognition Arousal/Alertness: Awake/alert Behavior During Therapy: WFL for tasks assessed/performed;Impulsive Overall Cognitive Status: Within Functional Limits for tasks assessed                                 General Comments: likes to tease   General Comments       Exercises     Shoulder Instructions      Home Living Family/patient expects to be discharged to:: Skilled nursing facility                                 Additional Comments: has been at SNF since CVA      Prior Functioning/Environment Level of Independence: Needs assistance  Gait / Transfers Assistance Needed: was ambulating with assistance with a cane daily ADL's / Homemaking Assistance Needed: needs assist Communication / Swallowing Assistance Needed: HOH          OT Problem List: Decreased strength;Decreased range of motion;Decreased activity tolerance;Impaired balance (sitting and/or standing);Decreased coordination;Decreased safety awareness;Impaired UE functional use;Pain      OT Treatment/Interventions:  Self-care/ADL training;Therapeutic exercise;Neuromuscular education;Manual therapy;Therapeutic activities;Patient/family education;Balance training    OT Goals(Current goals can be found in the care plan section) Acute Rehab OT Goals Patient Stated Goal: be more independent, get outdoors OT Goal Formulation: With patient Time For Goal Achievement: 12/13/17 Potential to Achieve Goals: Good ADL Goals Pt Will Perform Grooming: with set-up;sitting Pt Will Perform Upper Body Dressing: with set-up;sitting Pt Will Perform Lower Body Dressing: with mod assist;sit to/from stand Pt Will Transfer to Toilet: with min assist;stand pivot transfer;bedside commode Pt Will Perform Toileting - Clothing Manipulation and hygiene: with min assist;sitting/lateral leans Pt/caregiver will Perform Home Exercise Program: Left upper extremity;Increased ROM;Increased strength;With written HEP provided  OT Frequency: Min 2X/week   Barriers to D/C:            Co-evaluation PT/OT/SLP Co-Evaluation/Treatment: Yes Reason for Co-Treatment: Necessary to address cognition/behavior during functional activity;For patient/therapist safety;To address functional/ADL transfers PT goals addressed during session: Mobility/safety with mobility;Balance;Proper use of DME;Strengthening/ROM OT goals addressed during session: ADL's and self-care;Strengthening/ROM      AM-PAC PT "6 Clicks" Daily Activity     Outcome Measure Help from another person eating meals?: A Little Help from another person taking care of personal grooming?: A Little Help from another person toileting, which includes using toliet, bedpan, or urinal?: A Lot Help from another person bathing (including washing, rinsing, drying)?: A Lot Help from another person to put on and taking off regular upper body clothing?:  A Lot Help from another person to put on and taking off regular lower body clothing?: A Lot 6 Click Score: 14   End of Session Equipment Utilized  During Treatment: Gait belt;Rolling walker(unable to use RW properly with LUE) Nurse Communication: Mobility status;Other (comment)(needs condom cath replaced)  Activity Tolerance: Patient tolerated treatment well Patient left: in chair;with call bell/phone within reach;with chair alarm set  OT Visit Diagnosis: Unsteadiness on feet (R26.81);Other abnormalities of gait and mobility (R26.89);Repeated falls (R29.6);Hemiplegia and hemiparesis;History of falling (Z91.81);Other symptoms and signs involving cognitive function;Other symptoms and signs involving the nervous system (R29.898) Hemiplegia - Right/Left: Left Hemiplegia - dominant/non-dominant: Non-Dominant Hemiplegia - caused by: Cerebral infarction                Time: 6578-4696 OT Time Calculation (min): 28 min Charges:  OT General Charges $OT Visit: 1 Visit OT Evaluation $OT Eval Moderate Complexity: [place] OTR/L Acute Rehabilitation Services Pager: [phone] Office: [place] 11/29/2017, 2:07 PM

## 2017-11-29 NOTE — Progress Notes (Signed)
EEG Completed; Results Pending  

## 2017-11-29 NOTE — Evaluation (Signed)
Physical Therapy Evaluation Patient Details Name: [name] MRN: [id number] DOB: 12/21/1950 Today's Date: 11/29/2017   History of Present Illness  [name]  is a 67 y.o. male, with past medical history of alcohol abuse, smoking, cocaine abuse who was recently admitted for an embolic right MCA CVA causing left-sided hemiparesis most likely due to right carotid artery stenosis who also has history of hypertension, CAD, COPD, dyslipidemia, DVT was brought in by EMS from SNF where he was found to have a sudden change in mental status.  Clinical Impression  Pt admitted with above diagnosis. Pt currently with functional limitations due to the deficits listed below (see PT Problem List). Pt required mod A +2 for bed mobility and transfer to chair. Pt was able to stand and ambulate 5' fwd and back with RW held on R side and assisted with holding on L side.  Pt will benefit from skilled PT to increase their independence and safety with mobility to allow discharge to the venue listed below.      Follow Up Recommendations SNF;Supervision/Assistance - 24 hour    Equipment Recommendations  None recommended by PT    Recommendations for Other Services       Precautions / Restrictions Precautions Precautions: Fall Restrictions Weight Bearing Restrictions: No      Mobility  Bed Mobility Overal bed mobility: Needs Assistance Bed Mobility: Supine to Sit     Supine to sit: Mod assist;+2 for physical assistance     General bed mobility comments: mod A needed for B LE's (L>R) off bed and elevate trunk into sitting. Pt able to manage balance EOB once sitting  Transfers Overall transfer level: Needs assistance Equipment used: None Transfers: Sit to/from [facility] Sit to Stand: Mod assist;+2 physical assistance Stand pivot transfers: Mod assist;+2 physical assistance       General transfer comment: B HHA for sit<>stand and SPT with decreased control on L side  and L lean  Ambulation/Gait Ambulation/Gait assistance: Mod assist;+2 physical assistance Gait Distance (Feet): 5 Feet Assistive device: Rolling walker (2 wheeled) Gait Pattern/deviations: Step-to pattern;Decreased weight shift to left;Decreased step length - left;Decreased stance time - left;Decreased dorsiflexion - left Gait velocity: decreased Gait velocity interpretation: <1.31 ft/sec, indicative of household ambulator General Gait Details: ambulated 5' fwd and 5' bkwd with use of RW but had to managed on L side by therapist due to pt's inability to hold on L. vc's given for safety, especially with bkwd walking  Stairs            Wheelchair Mobility    Modified Rankin (Stroke Patients Only) Modified Rankin (Stroke Patients Only) Pre-Morbid Rankin Score: Moderately severe disability Modified Rankin: Severe disability     Balance Overall balance assessment: Needs assistance;History of Falls Sitting-balance support: No upper extremity supported;Feet supported Sitting balance-Leahy Scale: Good     Standing balance support: Bilateral upper extremity supported;During functional activity Standing balance-Leahy Scale: Poor Standing balance comment: static standing required +2 min A for safety                             Pertinent Vitals/Pain Pain Assessment: Faces Faces Pain Scale: Hurts little more Pain Location: L shoulder (subluxation) Pain Descriptors / Indicators: Constant;Aching Pain Intervention(s): Limited activity within patient's tolerance;Monitored during session    [place] expects to be discharged to:: Skilled nursing facility  Additional Comments: has been at SNF since CVA    Prior Function Level of Independence: Needs assistance   Gait / Transfers Assistance Needed: was ambulating with assistance with a cane daily  ADL's / Homemaking Assistance Needed: needs assist        Hand Dominance    Dominant Hand: Right    Extremity/Trunk Assessment   Upper Extremity Assessment Upper Extremity Assessment: Defer to OT evaluation    Lower Extremity Assessment Lower Extremity Assessment: LLE deficits/detail LLE Deficits / Details: hip flex 2+/5, knee ext 3/5, knee flex 3/5, apraxia noted in standing LLE Sensation: decreased light touch;decreased proprioception LLE Coordination: decreased fine motor;decreased gross motor    Cervical / Trunk Assessment Cervical / Trunk Assessment: Normal  Communication   Communication: No difficulties  Cognition Arousal/Alertness: Awake/alert Behavior During Therapy: WFL for tasks assessed/performed;Impulsive Overall Cognitive Status: Within Functional Limits for tasks assessed                                 General Comments: likes to tease      General Comments      Exercises     Assessment/Plan    PT Assessment Patient needs continued PT services  PT Problem List Decreased strength;Decreased range of motion;Decreased activity tolerance;Decreased balance;Decreased mobility;Decreased coordination;Decreased knowledge of precautions;Pain;Impaired sensation       PT Treatment Interventions DME instruction;Gait training;Functional mobility training;Therapeutic activities;Therapeutic exercise;Patient/family education;Neuromuscular re-education;Balance training    PT Goals (Current goals can be found in the Care Plan section)  Acute Rehab PT Goals Patient Stated Goal: be more independent, get outdoors PT Goal Formulation: With patient Time For Goal Achievement: 12/13/17 Potential to Achieve Goals: Fair    Frequency Min 2X/week   Barriers to discharge        Co-evaluation PT/OT/SLP Co-Evaluation/Treatment: Yes Reason for Co-Treatment: For patient/therapist safety;Necessary to address cognition/behavior during functional activity;To address functional/ADL transfers PT goals addressed during session: Mobility/safety  with mobility;Balance;Proper use of DME         AM-PAC PT "6 Clicks" Daily Activity  Outcome Measure Difficulty turning over in bed (including adjusting bedclothes, sheets and blankets)?: Unable Difficulty moving from lying on back to sitting on the side of the bed? : Unable Difficulty sitting down on and standing up from a chair with arms (e.g., wheelchair, bedside commode, etc,.)?: Unable Help needed moving to and from a bed to chair (including a wheelchair)?: A Lot Help needed walking in hospital room?: A Lot Help needed climbing 3-5 steps with a railing? : Total 6 Click Score: 8    End of Session Equipment Utilized During Treatment: Gait belt Activity Tolerance: Patient tolerated treatment well Patient left: in chair;with call bell/phone within reach;with chair alarm set Nurse Communication: Mobility status PT Visit Diagnosis: Unsteadiness on feet (R26.81);Repeated falls (R29.6);Hemiplegia and hemiparesis;Difficulty in walking, not elsewhere classified (R26.2);Pain Hemiplegia - Right/Left: Left Hemiplegia - dominant/non-dominant: Non-dominant Hemiplegia - caused by: Cerebral infarction Pain - Right/Left: Left Pain - part of body: Shoulder    Time: 6195-0932 PT Time Calculation (min) (ACUTE ONLY): 26 min   Charges:   PT Evaluation $PT Eval Moderate Complexity: [place]  Pager [phone] Office [place] 11/29/2017, 12:51 PM

## 2017-11-29 NOTE — Procedures (Addendum)
ELECTROENCEPHALOGRAM REPORT   Patient: [name]       Room #: 6O82O EEG No. ID: [id number] Age: 67 y.o.        Sex: male Referring Physician: Candiss Norse Report Date:  11/29/2017        Interpreting Physician: Alexis Goodell  History: [name] is an 67 y.o. male with history of right MCA infarct and acute onset mental status change  Medications:  ASA, Lipitor, Rocephin, Plavix, Protonix  Conditions of Recording:  This is a 21 channel routine scalp EEG performed with bipolar and monopolar montages arranged in accordance to the international 10/20 system of electrode placement. One channel was dedicated to EKG recording.  The patient is in the awake and drowsy states.  Description:  The patient actually appears to be in drowse throughout the entire recording.  The background activity is slow and poorly organized.  It consists of a low voltage mixture of theta and delta activity as the dominant rhythm with some superimposed beta activity noted at times. On one occasion there is noted a left temporal sharp transient with phase reversal at T3.   Stage II sleep is not obtained. Hyperventilation and intermittent photic stimulation were not performed.   IMPRESSION: This EEG is characterized by slowing which is consistent with normal drowse.  Can not rule out the possibility of slowing related to general cerebral disturbance such as a metabolic encephalopathy.  Clinical correlation recommended.   COMMENT: The one sharp transient noted during the recording is considered within the limits of normal.      Alexis Goodell, MD Neurology [phone] 11/29/2017, 12:21 PM

## 2017-11-29 NOTE — Progress Notes (Signed)
OT Cancellation Note  Patient Details Name: [name] MRN: [id number] DOB: 09-14-1950   Cancelled Treatment:    Reason Eval/Treat Not Completed: Patient at procedure or test/ unavailable(EEG)  Merri Ray Girtha Kilgore 11/29/2017, 9:15 AM   Hulda Humphrey OTR/L Acute Rehabilitation Services Pager: [phone] Office: [phone]

## 2017-11-29 NOTE — Progress Notes (Signed)
Orthopedic Tech Progress Note Patient Details:  [name] 10-14-1950 [id number]  Ortho Devices Type of Ortho Device: Arm sling Ortho Device/Splint Location: lue Ortho Device/Splint Interventions: Application   Post Interventions Patient Tolerated: Well Instructions Provided: Care of device   [name] 11/29/2017, 1:21 PM

## 2017-11-29 NOTE — Progress Notes (Addendum)
Subjective: Patient continues to have no recollection of the event.  Currently sitting in a chair alert and oriented  Exam: Vitals:   11/29/17 0113 11/29/17 0437  BP: (!) 153/65 (!) 117/58  Pulse: 78 81  Resp: 19   Temp: 98.3 F (36.8 C) 98.7 F (37.1 C)  SpO2: 94% 93%    Physical Exam   HEENT-  Normocephalic, no lesions, without obvious abnormality.  Normal external eye and conjunctiva.   Extremities- Warm, dry and intact Musculoskeletal-no joint tenderness, deformity or swelling Skin-warm and dry, no hyperpigmentation, vitiligo, or suspicious lesions    Neuro:  Mental Status: Alert, oriented to hospital/month/year/city., thought content appropriate.  At this time patient states he does not recall any of the event.  He is able to follow commands with no significant abnormalities.  I did not notice any dysarthria today.  I did not notice any mumbling today.   Cranial Nerves: II:  Visual fields grossly normal,  III,IV, VI: ptosis noted in the left eye, extra-ocular motions intact bilaterally pupils equal, round, reactive to light and accommodation V,VII: Left facial droop secondary to previous stroke, facial light touch sensation normal bilaterally VIII: hearing normal bilaterally IX,X: uvula rises midline XI: bilateral shoulder shrug XII: midline tongue extension Motor: Right : Upper extremity   5/5    Left:     Upper extremity   3/5  Lower extremity   5/5     Lower extremity   4/5 Tone and bulk:normal tone throughout; no atrophy noted Sensory: Pinprick and light touch intact throughout, bilaterally Deep Tendon Reflexes: Left upper extremity is brisk 3+ at the bicep, right upper extremity's 2+ at the bicep, left lower extremity 2+ and right lower extremity 2+ and knee jerk Plantars: Right: downgoing   Left: downgoing Cerebellar: normal finger-to-nose,      Medications:  Scheduled: .  stroke: mapping our early stages of recovery book   Does not apply Once  . aspirin  EC  81 mg Oral Daily  . atorvastatin  80 mg Oral q1800  . clopidogrel  75 mg Oral Q breakfast  . heparin  5,000 Units Subcutaneous Q8H  . pantoprazole  40 mg Oral Daily  . sodium chloride flush  3 mL Intravenous Q12H    Pertinent Labs/Diagnostics: OIZ:TIWP EEG is characterized by slowing which is consistent with normal drowse.  Can not rule out the possibility of slowing related to general cerebral disturbance such as a metabolic encephalopathy.  Clinical correlation recommended.   COMMENT: The one sharp transient noted during the recording is considered within the limits of normal.    Ct Head Wo Contrast  Result Date: 11/28/2017 CLINICAL DATA:  Altered mental status of unclear cause EXAM: CT HEAD WITHOUT CONTRAST TECHNIQUE: Contiguous axial images were obtained from the base of the skull through the vertex without intravenous contrast. COMPARISON:  10/25/2017 FINDINGS: Brain: Remote right MCA/watershed distribution infarcts. No evidence of acute infarct, acute hemorrhage, hydrocephalus, or collection. Vascular: Right ICA stent. Skull: No acute finding. Sinuses/Orbits: Prior blowout fracture of the left orbital floor with plating. Bilateral cataract resection. No acute finding IMPRESSION: 1. No acute finding. 2. Remote right cerebral infarcts. Electronically Signed   By: Monte Fantasia M.D.   On: 11/28/2017 16:19   Dg Chest Portable 1 View  Result Date: 11/28/2017 CLINICAL DATA:  Patient found unresponsive today. EXAM: PORTABLE CHEST 1 VIEW COMPARISON:  Single-view of the chest 09/19/2017. PA and lateral chest 04/13/2017. FINDINGS: There is mild bibasilar atelectasis. No pneumothorax or pleural  effusion. Nodular opacity measuring 1.2 cm projects between the right seventh and eighth ribs. It was likely present on the most recent study although more difficult to visualize. Aortic atherosclerosis is noted. No acute bony abnormality. IMPRESSION: No acute disease. 1.2 cm nodular opacity projects in  the right mid lung. Nonemergent chest CT with IV contrast could be used for further evaluation after the patient's acute episode has passed. Electronically Signed   By: Inge Rise M.D.   On: 11/28/2017 15:32     Etta Quill PA-C [facility] [phone]   Assessment: 67 year old male with past medical history of CVA and residual left-sided deficits, alcohol abuse, CHF, hyperlipidemia, diabetes, hypertension presenting to most [place] from SNF after being found unresponsive.  Patient has no recollection of the event.  Head CT is for any acute abnormalities.  EEG is negative for any seizure-like activity. Impression:  Differential diagnosis includes: - seizure, possible new stroke  Recommendations: - MRI of the brain which has been ordered -TSH, B12, RPR, - Urine culture Artie has been ordered    11/29/2017, 1:22 PM  NEUROHOSPITALIST ADDENDUM Performed a face to face diagnostic evaluation.   I have reviewed the contents of history and physical exam as documented by PA/ARNP/Resident and agree with above documentation.  I have discussed and formulated the above plan as documented. Edits to the note have been made as needed.  Likely had seizure vs another stroke. MRI brain pending.    Karena Addison Aleysia Oltmann MD [facility] [phone]   If 7pm to 7am, please call on call as listed on AMION.

## 2017-11-29 NOTE — Evaluation (Signed)
Clinical/Bedside Swallow Evaluation Patient Details  Name: [name] MRN: [id number] Date of Birth: [birth year]  Today's Date: 11/29/2017 Time: SLP Start Time (ACUTE ONLY): 1000 SLP Stop Time (ACUTE ONLY): 1025 SLP Time Calculation (min) (ACUTE ONLY): 25 min  Past Medical History:  Past Medical History:  Diagnosis Date  . Alcohol abuse    H/O  . Anxiety   . Arthritis   . Asthma   . CAD (coronary artery disease) 05/27/10   Cath: severe single vessell CAD left cx midportion obtuse marginal 2 to 3.  [name] CHF (congestive heart failure) ([place])   . COPD (chronic obstructive pulmonary disease) ([place])   . COPD (chronic obstructive pulmonary disease) ([place])   . Depression   . Diabetes mellitus, type 2 ([place]) 03/13/2015  . GERD (gastroesophageal reflux disease)   . HCAP (healthcare-associated pneumonia) 10/15/2015  . History of DVT of lower extremity   . Hyperlipemia   . Hyperlipidemia   . Hypertension   . Myocardial infarction ([place]) 2000  . Orbital fracture 12/2012  . OSA on CPAP   . Peripheral vascular disease, unspecified ([place])    08/20/10 doppler: increase in right ABI post-op. Left ABI stable. S/P bi-fem bypass surgery  . Pneumonia 04/04/2012  . Shortness of breath   . Stroke ([place])   . Tobacco abuse    Past Surgical History:  Past Surgical History:  Procedure Laterality Date  . abdoninal ao angio & bifem angio  05/27/10   Patent graft, occluded bil stents with no retrograde flow into the hypogastric arteries. 100% occl left ant. tibial artery, 70% to 80% to 100% stenosis right superficial fem artery above adductor canal. 100 % occl right ant. tibial vessell  . Aortogram w/ PTCA  09/292003  . BACK SURGERY    . CARDIAC CATHETERIZATION  05/27/10   severe CAD left cx  . CHOLECYSTECTOMY    . ESOPHAGOGASTRIC FUNDOPLICATION    . EYE SURGERY    . FEMORAL BYPASS  08/19/10   Right Fem-Pop  . IR ANGIO INTRA EXTRACRAN SEL COM CAROTID INNOMINATE BILAT MOD SED  07/20/2016  . IR ANGIO  VERTEBRAL SEL SUBCLAVIAN INNOMINATE UNI R MOD SED  07/20/2016  . IR ANGIO VERTEBRAL SEL VERTEBRAL UNI L MOD SED  07/20/2016  . IR RADIOLOGIST EVAL & MGMT  05/27/2016  . JOINT REPLACEMENT    . PERIPHERAL VASCULAR CATHETERIZATION N/A 12/10/2014   Procedure: Abdominal Aortogram;  Surgeon: Angelia Mould, MD;  Location: [place] [facility];  Service: Cardiovascular;  Laterality: N/A;  . RADIOLOGY WITH ANESTHESIA N/A 02/08/2015   Procedure: RADIOLOGY WITH ANESTHESIA;  Surgeon: Luanne Bras, MD;  Location: [facility];  Service: Radiology;  Laterality: N/A;  . TEE WITHOUT CARDIOVERSION N/A 08/29/2014   Procedure: TRANSESOPHAGEAL ECHOCARDIOGRAM (TEE);  Surgeon: Josue Hector, MD;  Location: [facility] ENDOSCOPY;  Service: Cardiovascular;  Laterality: N/A;  . TOTAL KNEE ARTHROPLASTY     HPI:  67 year old male admitted 11/28/17 with AMS. PMH: alcohol abuse, smoking, cocaine abuse, R MCA CVA with left hemiparesis, HTN, CAD, COPD, DLD, DVT, GERD. Head CT = no acute findings. CXR = no acute disease. MRI (10/26/17) = Scattered areas of acute cortical ischemia involving the right MCA territory and right MCA watershed zones, most likely due to acute occlusion of proximal right ICA stent as seen on prior CTA.    Assessment / Plan / Recommendation Clinical Impression  Pt completed oral care after set up. He is edentulous, and reports having no dentures. Pt presents with  adequate oral motor strength and function. He passed the 3 oz water test without difficulty, and did not exhibit oral difficulty or overt s/s aspiration on any consistency tested. This presentation is similar to last BSE completed in September 2019. Will begin regular diet and thin liquids. No further ST intervention recommended at this time. Please reconsult if needs arise.     SLP Visit Diagnosis: Dysphagia, unspecified (R13.10)    Aspiration Risk  Mild aspiration risk    Diet Recommendation Regular;Thin liquid   Liquid Administration via:  Cup;Straw Medication Administration: Whole meds with liquid Supervision: Patient able to self feed;Staff to assist with self feeding(Pt will need assistance with setup) Compensations: Minimize environmental distractions;Slow rate;Small sips/bites Postural Changes: Seated upright at 90 degrees;Remain upright for at least 30 minutes after po intake    Other  Recommendations Oral Care Recommendations: Oral care BID   Follow up Recommendations None          Prognosis Prognosis for Safe Diet Advancement: Good      Swallow Study   General Date of Onset: 11/28/17 HPI: 67 year old male admitted 11/28/17 with AMS. PMH: alcohol abuse, smoking, cocaine abuse, R MCA CVA with left hemiparesis, HTN, CAD, COPD, DLD, DVT, GERD. Head CT = no acute findings. CXR = no acute disease. MRI (10/26/17) = Scattered areas of acute cortical ischemia involving the right MCA territory and right MCA watershed zones, most likely due to acute occlusion of proximal right ICA stent as seen on prior CTA.  Type of Study: Bedside Swallow Evaluation Previous Swallow Assessment: BSE 10/27/17 - reg/thin Diet Prior to this Study: NPO Temperature Spikes Noted: No Respiratory Status: Room air History of Recent Intubation: No Behavior/Cognition: Alert;Cooperative;Pleasant mood Oral Cavity Assessment: Dry Oral Care Completed by SLP: Yes Oral Cavity - Dentition: Edentulous Vision: Functional for self-feeding Self-Feeding Abilities: Able to feed self;Needs set up(LUE flaccid) Patient Positioning: Upright in bed Baseline Vocal Quality: Hoarse Volitional Cough: Strong Volitional Swallow: Able to elicit    Oral/Motor/Sensory Function Overall Oral Motor/Sensory Function: Within functional limits   Ice Chips Ice chips: Not tested   Thin Liquid Thin Liquid: Within functional limits Presentation: Straw    Nectar Thick Nectar Thick Liquid: Not tested   Honey Thick Honey Thick Liquid: Not tested   Puree Puree: Within functional  limits Presentation: Self Fed   Solid     Solid: Within functional limits Presentation: Manzanola B. Quentin Ore, [facility], [place] Speech Language Pathologist [phone]  [name] 11/29/2017,10:31 AM

## 2017-11-29 NOTE — Progress Notes (Signed)
PROGRESS NOTE                                                                                                                                                                                                             Patient Demographics:    [name], is a 67 y.o. male, DOB - 30-Nov-1950, VCB:[id number]  Admit date - 11/28/2017   Admitting Physician Dolores Frame  Outpatient Primary MD for the patient is Charlott Rakes, MD  LOS - 0  Chief Complaint  Patient presents with  . Altered Mental Status       Brief Narrative   [name]  is a 67 y.o. male, with past medical history of alcohol abuse, smoking, cocaine abuse who was recently admitted for an embolic right MCA CVA causing left-sided hemiparesis most likely due to right carotid artery stenosis who also has history of hypertension, CAD, COPD, dyslipidemia, DVT was brought in by EMS from SNF where he was found to have a sudden change in mental status.    EMS were told by the SNF staff that patient might have taken something but they were not sure what, he came to the ER obtunded had pinpoint pupils, head CT was unremarkable, he received Narcan with some improvement in his mental status.  By the time I was called patient is starting to wake up, he does not recall why he was brought here, he has no subjective complaints.  ER physician has consulted neurology and requested me to admit the patient to complete his decrease in mental status work-up.       Subjective:    [name] today has, No headache, No chest pain, No abdominal pain - No Nausea, No new weakness tingling or numbness, No Cough - SOB.     Assessment  & Plan :     1.  Acute encephalopathy.  Reason unclear.  Apparently this happened suddenly at SNF, urine tox screen negative here, he denies using alcohol recently or doing any drugs, he denies taking any new medications or abusing any medications.    Etiology remains unclear neuro on  board, CT head was nonacute, he continues to have old left-sided hemiparesis, this morning his mentation is back to baseline, we continue to hold off potentially offending medications which include Lyrica, Flexeril and trazodone, we are trying to rule out seizure versus new stroke, EEG and MRI brain pending.  Continue supportive care.  Neuro on board.  Mentation is much improved and close to  baseline.  2.  Recent embolic right MCA stroke with left-sided hemiparesis, most likely due to right carotid artery disease.  Renew dual antiplatelet therapy and statin for secondary prevention, PT OT and speech consult. See above #1..  3.  History of alcohol abuse, cocaine abuse, smoking.  Denies doing any recently, urine tox screen negative, supportive care and monitor.  4.  History of right carotid artery disease (stent) with recent embolic stroke.  For now treatment as per #2 above with dual antiplatelet therapy and statin, will require vascular surgery follow-up soon after discharge.  5.  Dyslipidemia.  On statin.  6.  History of COPD.  No acute issue.  Supportive care PRN nebulizer treatments and oxygen if needed.  7.  Chronic diastolic CHF.  Recent EF 60% on echocardiogram done on 10/27/2017.  Likely mild dehydration, hold diuretic gentle LR and monitor.  8.  History of GERD.  On PPI.  9.  History of CAD/PAD/Right-sided carotid artery stenosis.  No acute issues, EKG nonacute, continue dual antiplatelet therapy and statin for secondary prevention.  10. Possible left shoulder subluxation.  Patient asymptomatic with PT concerned, will apply sling check x-ray.   Family Communication  :  None  Code Status :  DNR   Disposition Plan  :  SNF  Consults  :  Neuro  Procedures  :    CT - Non acute  MRI  DVT Prophylaxis  :   Heparin    Lab Results  Component Value Date   PLT 158 11/29/2017    Diet :  Diet Order            Diet Heart Room service appropriate? Yes; Fluid consistency:  Thin  Diet effective now               Inpatient Medications Scheduled Meds: .  stroke: mapping our early stages of recovery book   Does not apply Once  . aspirin EC  81 mg Oral Daily  . atorvastatin  80 mg Oral q1800  . clopidogrel  75 mg Oral Q breakfast  . heparin  5,000 Units Subcutaneous Q8H  . pantoprazole  40 mg Oral Daily  . sodium chloride flush  3 mL Intravenous Q12H   Continuous Infusions: . cefTRIAXone (ROCEPHIN)  IV 200 mL/hr at 11/29/17 0800  . lactated ringers Stopped (11/29/17 0759)   PRN Meds:.acetaminophen **OR** acetaminophen, bisacodyl, lactulose, LORazepam  Antibiotics  :   Anti-infectives (From admission, onward)   Start     Dose/Rate Route Frequency Ordered Stop   11/29/17 0630  cefTRIAXone (ROCEPHIN) 1 g in sodium chloride 0.9 % 100 mL IVPB     1 g 200 mL/hr over 30 Minutes Intravenous Every 24 hours 11/29/17 0618 12/06/17 0629          Objective:   Vitals:   11/28/17 2013 11/28/17 2237 11/29/17 0113 11/29/17 0437  BP: (!) 141/67 (!) 152/65 (!) 153/65 (!) 117/58  Pulse: 72 70 78 81  Resp: 19 18 19    Temp: 98.2 F (36.8 C) 99.1 F (37.3 C) 98.3 F (36.8 C) 98.7 F (37.1 C)  TempSrc:      SpO2: 98% 95% 94% 93%  Weight:      Height:        Wt Readings from Last 3 Encounters:  11/28/17 105.5 kg  10/28/17 105.5 kg  08/19/17 101 kg     Intake/Output Summary (Last 24 hours) at 11/29/2017 1155 Last data filed at 11/29/2017 1056 Gross per 24 hour  Intake 324.3 ml  Output 930 ml  Net -605.7 ml     Physical Exam  Awake Alert, Oriented X 3, No new F.N deficits, continues to have left-sided hemiparesis from previous stroke, normal affect [place].AT,PERRAL Supple Neck,No JVD, No cervical lymphadenopathy appriciated.  Symmetrical Chest wall movement, Good air movement bilaterally, CTAB RRR,No Gallops,Rubs or new Murmurs, No Parasternal Heave +ve B.Sounds, Abd Soft, No tenderness, No organomegaly appriciated, No rebound - guarding or  rigidity. No Cyanosis, Clubbing or edema, No new Rash or bruise      Data Review:    CBC Recent Labs  Lab 11/28/17 1436 11/28/17 1906 11/29/17 0557  WBC 5.1 7.3 7.9  HGB 12.1* 12.7* 13.0  HCT 39.6 42.0 43.3  PLT 149* 165 158  MCV 94.3 92.9 92.5  MCH 28.8 28.1 27.8  MCHC 30.6 30.2 30.0  RDW 14.7 14.6 14.8    Chemistries  Recent Labs  Lab 11/28/17 1436 11/28/17 1906 11/29/17 0557  NA 146*  --  147*  K 3.8  --  3.6  CL 112*  --  112*  CO2 28  --  27  GLUCOSE 102*  --  108*  BUN 14  --  10  CREATININE 0.77 0.66 0.67  CALCIUM 9.6  --  9.6  AST 19  --  19  ALT 17  --  17  ALKPHOS 67  --  74  BILITOT 0.6  --  0.9   ------------------------------------------------------------------------------------------------------------------ No results for input(s): CHOL, HDL, LDLCALC, TRIG, CHOLHDL, LDLDIRECT in the last 72 hours.  Lab Results  Component Value Date   HGBA1C 5.5 10/26/2017   ------------------------------------------------------------------------------------------------------------------ Recent Labs    11/28/17 1906  TSH 0.748   ------------------------------------------------------------------------------------------------------------------ No results for input(s): VITAMINB12, FOLATE, FERRITIN, TIBC, IRON, RETICCTPCT in the last 72 hours.  Coagulation profile Recent Labs  Lab 11/28/17 1906  INR 1.15    No results for input(s): DDIMER in the last 72 hours.  Cardiac Enzymes No results for input(s): CKMB, TROPONINI, MYOGLOBIN in the last 168 hours.  Invalid input(s): CK ------------------------------------------------------------------------------------------------------------------    Component Value Date/Time   BNP 35.6 10/25/2017 2100    Micro Results Recent Results (from the past 240 hour(s))  MRSA PCR Screening     Status: None   Collection Time: 11/28/17  6:23 PM  Result Value Ref Range Status   MRSA by PCR NEGATIVE NEGATIVE Final     Comment:        The GeneXpert MRSA Assay (FDA approved for NASAL specimens only), is one component of a comprehensive MRSA colonization surveillance program. It is not intended to diagnose MRSA infection nor to guide or monitor treatment for MRSA infections. Performed at [facility], [place] [address], [place], [place] [address]     Radiology Reports Ct Head Wo Contrast  Result Date: 11/28/2017 CLINICAL DATA:  Altered mental status of unclear cause EXAM: CT HEAD WITHOUT CONTRAST TECHNIQUE: Contiguous axial images were obtained from the base of the skull through the vertex without intravenous contrast. COMPARISON:  10/25/2017 FINDINGS: Brain: Remote right MCA/watershed distribution infarcts. No evidence of acute infarct, acute hemorrhage, hydrocephalus, or collection. Vascular: Right ICA stent. Skull: No acute finding. Sinuses/Orbits: Prior blowout fracture of the left orbital floor with plating. Bilateral cataract resection. No acute finding IMPRESSION: 1. No acute finding. 2. Remote right cerebral infarcts. Electronically Signed   By: Monte Fantasia M.D.   On: 11/28/2017 16:19   Dg Chest Portable 1 View  Result Date: 11/28/2017 CLINICAL DATA:  Patient found unresponsive today. EXAM: PORTABLE CHEST 1 VIEW COMPARISON:  Single-view of the chest 09/19/2017. PA and lateral chest 04/13/2017. FINDINGS: There is mild bibasilar atelectasis. No pneumothorax or pleural effusion. Nodular opacity measuring 1.2 cm projects between the right seventh and eighth ribs. It was likely present on the most recent study although more difficult to visualize. Aortic atherosclerosis is noted. No acute bony abnormality. IMPRESSION: No acute disease. 1.2 cm nodular opacity projects in the right mid lung. Nonemergent chest CT with IV contrast could be used for further evaluation after the patient's acute episode has passed. Electronically Signed   By: Inge Rise M.D.   On: 11/28/2017 15:32    Time Spent in  minutes  30   Lala Lund M.D on 11/29/2017 at 11:55 AM  To page go to [email or web address] - password [facility]

## 2017-11-30 DIAGNOSIS — I5032 Chronic diastolic (congestive) heart failure: Secondary | ICD-10-CM | POA: Diagnosis not present

## 2017-11-30 DIAGNOSIS — Z66 Do not resuscitate: Secondary | ICD-10-CM | POA: Diagnosis not present

## 2017-11-30 DIAGNOSIS — G9340 Encephalopathy, unspecified: Secondary | ICD-10-CM | POA: Diagnosis not present

## 2017-11-30 DIAGNOSIS — I69354 Hemiplegia and hemiparesis following cerebral infarction affecting left non-dominant side: Secondary | ICD-10-CM | POA: Diagnosis not present

## 2017-11-30 DIAGNOSIS — I110 Hypertensive heart disease with heart failure: Secondary | ICD-10-CM | POA: Diagnosis not present

## 2017-11-30 LAB — RPR: RPR: NONREACTIVE

## 2017-11-30 MED ORDER — LEVETIRACETAM 500 MG PO TABS: 500.0000 mg | ORAL_TABLET | Freq: Two times a day (BID) | ORAL | Status: DC

## 2017-11-30 MED ORDER — LEVETIRACETAM 500 MG PO TABS
500.0000 mg | ORAL_TABLET | Freq: Two times a day (BID) | ORAL | Status: DC
  Administered 2017-11-30 – 2017-12-02 (×5): 500 mg via ORAL
  Filled 2017-11-30 (×5): qty 1

## 2017-11-30 MED ORDER — CYANOCOBALAMIN 1000 MCG/ML IJ SOLN
1000.0000 ug | Freq: Every day | INTRAMUSCULAR | Status: DC
  Administered 2017-11-30 – 2017-12-02 (×3): 1000 ug via INTRAMUSCULAR
  Filled 2017-11-30 (×3): qty 1

## 2017-11-30 MED ORDER — CEPHALEXIN 500 MG PO CAPS: 500.0000 mg | ORAL_CAPSULE | Freq: Three times a day (TID) | ORAL | 0 refills | Status: AC

## 2017-11-30 NOTE — Progress Notes (Signed)
[place] still does not have insurance approval for patient to discharge there. CSW will follow up tomorrow.   Percell Locus Clover Feehan LCSW [phone]

## 2017-11-30 NOTE — Discharge Instructions (Signed)
Follow with Primary MD Charlott Rakes, MD in 7 days   Get CBC, CMP, 2 view Chest X ray -  checked  by Primary MD or SNF MD in 5-7 days   Activity: As tolerated with Full fall precautions use walker/cane & assistance as needed  Disposition SNF  Diet: Heart Healthy  with feeding assistance and aspiration precautions.  Special Instructions: If you have smoked or chewed Tobacco  in the last 2 yrs please stop smoking, stop any regular Alcohol  and or any Recreational drug use.  On your next visit with your primary care physician please Get Medicines reviewed and adjusted.  Please request your Prim.MD to go over all Hospital Tests and Procedure/Radiological results at the follow up, please get all Hospital records sent to your Prim MD by signing hospital release before you go home.  If you experience worsening of your admission symptoms, develop shortness of breath, life threatening emergency, suicidal or homicidal thoughts you must seek medical attention immediately by calling 911 or calling your MD immediately  if symptoms less severe.  You Must read complete instructions/literature along with all the possible adverse reactions/side effects for all the Medicines you take and that have been prescribed to you. Take any new Medicines after you have completely understood and accpet all the possible adverse reactions/side effects.   Do not drive, operate heavy machinery, perform activities at heights, swimming or participation in water activities or provide baby sitting services if your were admitted for syncope or siezures until you have seen by Primary MD or a Neurologist and advised to do so again.

## 2017-11-30 NOTE — Clinical Social Work Note (Signed)
Clinical Social Work Assessment  Patient Details  Name: AADIL SUR MRN: [id number] Date of Birth: 1950-06-29  Date of referral:  11/30/17               Reason for consult:  Facility Placement                Permission sought to share information with:  Facility Sport and exercise psychologist, Family Supports Permission granted to share information::  Yes, Verbal Permission Granted  Name::     Chartered certified accountant::  SNFs/[place]  Relationship::  Friend  Contact Information:     [phone]   Housing/Transportation Living arrangements for the past 2 months:  [place] of Information:  Patient, Friend/Neighbor Patient Interpreter Needed:  None Criminal Activity/Legal Involvement Pertinent to Current Situation/Hospitalization:  No - Comment as needed Significant Relationships:  Friend Lives with:  Facility Resident Do you feel safe going back to the place where you live?  No Need for family participation in patient care:  Yes (Comment)  Care giving concerns:  CSW received consult regarding CSW to continue to follow and assist with discharge planning needs. CSW spoke with patient. He reported that he actually came to the hospital from [place]. He was recently at [facility] for rehab. He provided CSW with permission to contact his only friend, [name]. CSW spoke with [name]. She reported that patient discharged from [facility] a few days ago and they sent him to [facility] without notifying her. Patient is wanting to return to [facility], where he was receiving section 8 housing and an nurse aide. [name] reports that she has spoken with them and they can save his spot until after rehab.   Social Worker assessment / plan:  CSW spoke with patient and [name] concerning possibility of rehab at [facility] before returning home.  Employment status:  Retired Forensic scientist:  Medicaid In [place], [employer] PT Recommendations:  [place] / Referral to community resources:  [place]  Patient/Family's Response to care:  [name] reported preference for patient to go back to [facility] for rehab since she lives down the street. She reports understanding of the insurance authorization process. She is working to get [employer] paperwork signed, though she is listed on all the patient's doctor office contact forms.   Patient/Family's Understanding of and Emotional Response to Diagnosis, Current Treatment, and Prognosis:  Patient/family is realistic regarding therapy needs and expressed being hopeful for SNF placement. Patient expressed understanding of CSW role and discharge process as well as medical condition. No questions/concerns about plan or treatment.    Emotional Assessment Appearance:  Appears stated age Attitude/Demeanor/Rapport:  Engaged Affect (typically observed):  Accepting, Appropriate Orientation:  Oriented to Self, Oriented to Place Alcohol / Substance use:  Not Applicable Psych involvement (Current and /or in the community):  No (Comment)  Discharge Needs  Concerns to be addressed:  Care Coordination Readmission within the last 30 days:  Yes Current discharge risk:  Dependent with Mobility Barriers to Discharge:  Continued Medical Work up   [employer], LCSW 11/30/2017, 12:07 PM

## 2017-11-30 NOTE — Progress Notes (Signed)
[place] has not received insurance approval for patient yet. Will update medical team when received.   Percell Locus Annjeanette Sarwar LCSW [phone]

## 2017-11-30 NOTE — Progress Notes (Signed)
   11/30/17 1000  Clinical Encounter Type  Visited With Patient  Visit Type Initial  Referral From Other (Comment)  Spiritual Encounters  Spiritual Needs Prayer;Emotional  Stress Factors  Patient Stress Factors Exhausted;Health changes   While doing rounds, PT wanted to talk. PT was concerned about leaving the hospital soon. I offered spiritual care with [facility], a listening ear, words of encouragement and prayer. Chaplain available as needed.   Chaplain Fidel Levy [phone]

## 2017-11-30 NOTE — Progress Notes (Addendum)
Subjective: No further spells.   Exam: Vitals:   11/29/17 2043 11/30/17 0545  BP: (!) 143/68 (!) 154/68  Pulse: 91 92  Resp: 18 17  Temp: 98.6 F (37 C) 99 F (37.2 C)  SpO2: 98% 95%    Physical Exam   HEENT-  Normocephalic, no lesions, without obvious abnormality.  Normal external eye and conjunctiva.   Extremities- Warm, dry and intact Musculoskeletal-no joint tenderness, deformity or swelling Skin-warm and dry, no hyperpigmentation, vitiligo, or suspicious lesions    Neuro:  Mental Status: Alert, oriented, thought content appropriate.  Speech fluent without evidence of aphasia.  Able to follow 3 step commands without difficulty. Cranial Nerves: II:  Visual fields grossly normal,  III,IV, VI: ptosis not present, extra-ocular motions intact bilaterally pupils equal, round, reactive to light and accommodation V,VII: smile symmetric, facial light touch sensation normal bilaterally VIII: hearing normal bilaterally IX,X: uvula rises midline XI: bilateral shoulder shrug XII: midline tongue extension Motor: Right : Upper extremity   5/5    Left:     Upper extremity   5/5  Lower extremity   5/5     Lower extremity   5/5 Tone and bulk:normal tone throughout; no atrophy noted Sensory: Pinprick and light touch intact throughout, bilaterally Deep Tendon Reflexes: 2+ and symmetric throughout Plantars: Right: downgoing   Left: downgoing Cerebellar: normal finger-to-nose, normal rapid alternating movements and normal heel-to-shin test     Medications:  Scheduled: .  stroke: mapping our early stages of recovery book   Does not apply Once  . aspirin EC  81 mg Oral Daily  . atorvastatin  80 mg Oral q1800  . clopidogrel  75 mg Oral Q breakfast  . cyanocobalamin  1,000 mcg Intramuscular Daily  . feeding supplement (ENSURE ENLIVE)  237 mL Oral BID BM  . heparin  5,000 Units Subcutaneous Q8H  . levETIRAcetam  500 mg Oral BID  . pantoprazole  40 mg Oral Daily  . sodium chloride  flush  3 mL Intravenous Q12H    Pertinent Labs/Diagnostics:   Ct Head Wo Contrast  Result Date: 11/28/2017 CLINICAL DATA:  Altered mental status of unclear cause EXAM: CT HEAD WITHOUT CONTRAST TECHNIQUE: Contiguous axial images were obtained from the base of the skull through the vertex without intravenous contrast. COMPARISON:  10/25/2017 FINDINGS: Brain: Remote right MCA/watershed distribution infarcts. No evidence of acute infarct, acute hemorrhage, hydrocephalus, or collection. Vascular: Right ICA stent. Skull: No acute finding. Sinuses/Orbits: Prior blowout fracture of the left orbital floor with plating. Bilateral cataract resection. No acute finding IMPRESSION: 1. No acute finding. 2. Remote right cerebral infarcts. Electronically Signed   By: Monte Fantasia M.D.   On: 11/28/2017 16:19   Mr Brain Wo Contrast  Result Date: 11/29/2017 CLINICAL DATA:  Prior stroke with left-sided deficits. Found unresponsive. EXAM: MRI HEAD WITHOUT CONTRAST TECHNIQUE: Multiplanar, multiecho pulse sequences of the brain and surrounding structures were obtained without intravenous contrast. COMPARISON:  Head CT 11/28/2017 Brain MRI 10/26/2017 FINDINGS: BRAIN: There is no acute infarct, acute hemorrhage, hydrocephalus or extra-axial collection. The midline structures are normal. No midline shift or other mass effect. Remote right frontal and parietal infarcts. Early confluent hyperintense T2-weighted signal of the periventricular and deep white matter, most commonly due to chronic ischemic microangiopathy. Generalized atrophy without lobar predilection. Focus of chronic microhemorrhage in the posterior right temporal lobe. VASCULAR: Loss of the normal right ICA flow void at the skull base, unchanged. SKULL AND UPPER CERVICAL SPINE: Calvarial bone marrow signal is normal.  There is no skull base mass. Visualized upper cervical spine and soft tissues are normal. SINUSES/ORBITS: No fluid levels or advanced mucosal  thickening. No mastoid or middle ear effusion. The orbits are normal. IMPRESSION: 1. No acute intracranial abnormality. 2. Old right frontal and parietal infarcts. 3. Generalized atrophy and chronic ischemic microangiopathy. 4. Unchanged loss of normal right ICA flow void at the skull base. Electronically Signed   By: Ulyses Jarred M.D.   On: 11/29/2017 23:40   Dg Chest Portable 1 View  Result Date: 11/28/2017 CLINICAL DATA:  Patient found unresponsive today. EXAM: PORTABLE CHEST 1 VIEW COMPARISON:  Single-view of the chest 09/19/2017. PA and lateral chest 04/13/2017. FINDINGS: There is mild bibasilar atelectasis. No pneumothorax or pleural effusion. Nodular opacity measuring 1.2 cm projects between the right seventh and eighth ribs. It was likely present on the most recent study although more difficult to visualize. Aortic atherosclerosis is noted. No acute bony abnormality. IMPRESSION: No acute disease. 1.2 cm nodular opacity projects in the right mid lung. Nonemergent chest CT with IV contrast could be used for further evaluation after the patient's acute episode has passed. Electronically Signed   By: Inge Rise M.D.   On: 11/28/2017 15:32   Dg Shoulder Left  Result Date: 11/29/2017 CLINICAL DATA:  Shoulder weakness EXAM: LEFT SHOULDER - 2+ VIEW COMPARISON:  None. FINDINGS: No fracture or malalignment. Moderate AC joint degenerative change. Mild glenohumeral degenerative change. Humeral head appears slightly high-riding. IMPRESSION: 1. No acute osseous abnormality 2. Mild to moderate arthritis of the [facility] joint and glenohumeral interval 3. Humeral head appears high-riding, possible rotator cuff disease. Electronically Signed   By: Donavan Foil M.D.   On: 11/29/2017 14:08     Etta Quill PA-C [facility] [phone]   Assessment:  67 year old male with past medical history of CVA and residual left-sided deficits, alcohol abuse, CHF, hyperlipidemia, diabetes, hypertension  presenting to [facility] [place] from SNF after being found unresponsive.  Patient has no recollection of the event.  Head CT is for any acute abnormalities.  EEG is negative for any seizure-like activity. At this time will start him on Keppra 500 mg BID and he will need to follow up with neurology  Recommendations: --Continue 500 mg BID Keppra --Follow up with neurology as out patient --Per [facility] statutes, patients with seizures are not allowed to drive until  they have been seizure-free for six months. Use caution when using heavy equipment or power tools. Avoid working on ladders or at heights. Take showers instead of baths. Ensure the water temperature is not too high on the home water heater. Do not go swimming alone. When caring for infants or small children, sit down when holding, feeding, or changing them to minimize risk of injury to the child in the event you have a seizure.   Also, Maintain good sleep hygiene. Avoid alcohol.     11/30/2017, 12:55 PM    NEUROHOSPITALIST ADDENDUM Performed a face to face diagnostic evaluation.   I have reviewed the contents of history and physical exam as documented by PA/ARNP/Resident and agree with above documentation.  I have discussed and formulated the above plan as documented. Edits to the note have been made as needed.  While we are not sure, the most likely explanation for patient being unresponsive in the nursing home is possible seizure.  Patient does have history of strokes and is at high risk for having a seizure.  MRI brain does not show any new stroke.  I think it is reasonable to start the patient empirically on Keppra 500 mg twice a day.  There is also concern for possible dementia, likely vascular given his prior strokes.  He will need to follow-up with neurology as an outpatient in 1 to 2 months on discharge.    Karena Addison Giavanni Zeitlin MD [facility] [phone]   If 7pm to 7am, please call on call as listed on  AMION.

## 2017-11-30 NOTE — NC FL2 (Signed)
[place] LEVEL OF CARE SCREENING TOOL     IDENTIFICATION  Patient Name: [name] Birthdate: September 12, 1950 Sex: male Admission Date (Current Location): 11/28/2017  [facility] and [place] Number:  Herbalist and Address:  [facility] [place], [place] [address], [place], [place] [address]      Provider Number: [id number]  Attending Physician Name and Address:  Thurnell Lose, MD  Relative Name and Phone Number:  [name] friend, [phone]    Current Level of Care: Hospital Recommended Level of Care: [place] Prior Approval Number:    Date Approved/Denied:   PASRR Number: [id number]  Discharge Plan: SNF    Current Diagnoses: Patient Active Problem List   Diagnosis Date Noted  . AMS (altered mental status) 11/29/2017  . Acute encephalopathy 11/28/2017  . Left shoulder pain 09/28/2017  . Cerebral thrombosis with cerebral infarction 07/18/2016  . Acute ischemic stroke ([place])   . Left hemiplegia ([place])   . Stroke-like symptoms 07/17/2016  . Insomnia 01/29/2016  . Depression 01/29/2016  . Alcohol abuse with intoxication ([place]) 10/16/2015  . Psoriasiform dermatitis 07/12/2015  . Dementia due to another medical condition ([place]) 07/12/2015  . Urinary incontinence 03/14/2015  . Diabetes mellitus, type 2 ([place]) 03/13/2015  . Hemiparesis affecting left side as late effect of cerebrovascular accident ([place]) 02/18/2015  . Cerebrovascular accident (CVA) due to stenosis of right carotid artery ([place])   . Coronary artery disease involving native coronary artery of native heart without angina pectoris   . Tachypnea   . Thrombocytopenia ([place])   . History of stroke   . Stroke (cerebrum) ([place]) 02/06/2015  . Arterial ischemic stroke, MCA (middle cerebral artery), right, acute ([place])   . DOE (dyspnea on exertion) 11/23/2014  . PAF (paroxysmal atrial fibrillation) ([place]) 11/23/2014  . HLD (hyperlipidemia) 11/01/2014  . Tobacco use  disorder 11/01/2014  . Diastolic CHF, acute on chronic (HCC) 11/01/2014  . Cerebral infarction due to stenosis of right carotid artery ([place]) 11/01/2014  . Carotid stenosis 11/01/2014  . Hyperkalemia 10/24/2014  . H/O total knee replacement 10/24/2014  . Essential hypertension   . Polysubstance abuse ([place]) 08/28/2014  . Cocaine abuse ([place])   . Intracranial carotid stenosis   . Headache around the eyes 08/27/2014  . Acute CVA (cerebrovascular accident) ([place]) 08/27/2014  . CVA (cerebral vascular accident) ([place]) 08/26/2014  . History of DVT (deep vein thrombosis)   . Alcoholism ([place])   . Embolic stroke ([place])   . Cerebral infarction due to unspecified mechanism   . Hypotension   . AKI (acute kidney injury) ([place])   . Stroke ([place])   . CVA (cerebral infarction) 08/01/2014  . Alcohol dependence with withdrawal with complication ([place]) 38/11/1749  . DVT (deep venous thrombosis) ([place]) 02/22/2014  . Lower extremity edema 02/21/2014  . Chronic diastolic congestive heart failure ([place]) 11/21/2013  . CAD (coronary artery disease) 11/21/2013  . Alcohol abuse 11/21/2013  . GERD (gastroesophageal reflux disease) 08/10/2013  . Gout 08/10/2013  . Tobacco abuse 07/26/2013  . Trichiasis without entropion 04/16/2013  . Dizziness 04/10/2013  . Eyelid lesion 01/24/2013  . Enophthalmos due to trauma 01/19/2013  . Medial orbital wall fracture 01/19/2013  . Closed blow-out fracture of floor of orbit ([place]) 01/19/2013  . Binocular vision disorder with diplopia 01/03/2013  . Fracture of inferior orbital wall ([place]) 01/03/2013  . Fracture of orbital floor ([place]) 01/03/2013  . Pain of left eye 01/03/2013  . Peripheral vascular disease ([place]) 07/27/2012  .  COPD (chronic obstructive pulmonary disease) ([place]) 04/04/2012  . HTN (hypertension) 04/04/2012    Orientation RESPIRATION BLADDER Height & Weight     Self, Place  Normal Continent Weight: 105.5 kg Height:  6' (182.9 cm)  BEHAVIORAL SYMPTOMS/MOOD  NEUROLOGICAL BOWEL NUTRITION STATUS      Continent Diet(Please see DC Summary)  AMBULATORY STATUS COMMUNICATION OF NEEDS Skin   Limited Assist Verbally Normal                       Personal Care Assistance Level of Assistance  Bathing, Feeding, Dressing Bathing Assistance: Limited assistance Feeding assistance: Independent Dressing Assistance: Limited assistance     Functional Limitations Info  Sight, Speech, Hearing Sight Info: Adequate Hearing Info: Adequate Speech Info: Adequate    SPECIAL CARE FACTORS FREQUENCY  PT (By licensed PT), OT (By licensed OT)     PT Frequency: 5x/week OT Frequency: 3x/week            Contractures Contractures Info: Not present    Additional Factors Info  Code Status, Allergies Code Status Info: DNR Allergies Info: Allergies:  Darvocet Propoxyphene N-acetaminophen, Haldol Haloperidol Decanoate, Acetaminophen, Metformin And Related, Norco Hydrocodone-acetaminophen           Current Medications (11/30/2017):  This is the current hospital active medication list Current Facility-Administered Medications  Medication Dose Route Frequency Provider Last Rate Last Dose  .  stroke: mapping our early stages of recovery book   Does not apply Once Thurnell Lose, MD      . acetaminophen (TYLENOL) tablet 650 mg  650 mg Oral Q6H PRN Thurnell Lose, MD       Or  . acetaminophen (TYLENOL) suppository 650 mg  650 mg Rectal Q6H PRN Thurnell Lose, MD      . aspirin EC tablet 81 mg  81 mg Oral Daily Thurnell Lose, MD   81 mg at 11/30/17 0937  . atorvastatin (LIPITOR) tablet 80 mg  80 mg Oral q1800 Thurnell Lose, MD   80 mg at 11/29/17 1727  . bisacodyl (DULCOLAX) suppository 10 mg  10 mg Rectal Daily PRN Thurnell Lose, MD      . cefTRIAXone (ROCEPHIN) 1 g in sodium chloride 0.9 % 100 mL IVPB  1 g Intravenous Q24H Thurnell Lose, MD 200 mL/hr at 11/30/17 0554 1 g at 11/30/17 0554  . clopidogrel (PLAVIX) tablet 75 mg  75 mg Oral  Q breakfast Thurnell Lose, MD   75 mg at 11/30/17 0937  . cyanocobalamin ((VITAMIN B-12)) injection 1,000 mcg  1,000 mcg Intramuscular Daily Thurnell Lose, MD   1,000 mcg at 11/30/17 0608  . feeding supplement (ENSURE ENLIVE) (ENSURE ENLIVE) liquid 237 mL  237 mL Oral BID BM Thurnell Lose, MD      . heparin injection 5,000 Units  5,000 Units Subcutaneous Q8H Thurnell Lose, MD   5,000 Units at 11/30/17 0542  . lactulose (CHRONULAC) 10 GM/15ML solution 10 g  10 g Oral BID PRN Thurnell Lose, MD      . levETIRAcetam (KEPPRA) tablet 500 mg  500 mg Oral BID Thurnell Lose, MD      . LORazepam (ATIVAN) injection 1 mg  1 mg Intravenous Q4H PRN Thurnell Lose, MD      . pantoprazole (PROTONIX) EC tablet 40 mg  40 mg Oral Daily Thurnell Lose, MD   40 mg at 11/30/17 0938  . sodium chloride flush (NS) 0.9 %  injection 3 mL  3 mL Intravenous Q12H Thurnell Lose, MD   3 mL at 11/29/17 2115     Discharge Medications: Please see discharge summary for a list of discharge medications.  Relevant Imaging Results:  Relevant Lab Results:   Additional Information SS# [id number]  Benard Halsted, LCSW

## 2017-11-30 NOTE — Progress Notes (Signed)
Initial Nutrition Assessment  DOCUMENTATION CODES:   Obesity unspecified  INTERVENTION:  Continue Ensure Enlive BID. Each supplement provides 350 kcal and 20 grams protein.  Pt needs minimal feeding assistance (help with opening bottles, sugar/condiment packets,etc)   NUTRITION DIAGNOSIS:   Increased nutrient needs related to chronic illness(COPD; CHF) as evidenced by estimated needs.  GOAL:   Patient will meet greater than or equal to 90% of their needs   MONITOR:   PO intake, Supplement acceptance, Weight trends, Labs  REASON FOR ASSESSMENT:   Malnutrition Screening Tool    ASSESSMENT:   [name] is a 67 yo male with PMH of polysubstance abuse, CAD, COPD, dyslipidemia, T2DM, DVT, PVD, recent admission for embolic right MCA CVA w/ left-sided hemiparesis, admitted from ALF for acute encephalopathy.   Student dietitian visited patient at bedside. Patient reports good appetite during hospitalization, confirmed by meal completion 100% per chart. PTA patient was living in [place]. There he says he eats 3 meals a day in cafeteria. However patient complains of not receiving enough food saying he gets "just enough to keep alive".  Prior to ALF patient reports living by himself; during this time patient says he ate 5-6 PB&Js per day because he can't cook.   Patient reports going from 270 lbs to 223 lbs in the past 4 months. However unable to confirm higher wt from chart. Per chart patient's wt relatively stable and trending upward.   Patient has reduced dexterity in fingers of right hand. During assessment patient asks to have his Ensure opened, and cream/sugar packets put into his coffee because he has trouble doing it himself. Patient has left sided weakness from CVA as well. Patient would benefit from minimal feeding assistance helping with small tasks such as opening bottles and lids, opening condiment packets, etc.  Medications reviewed and include: vitamin B-12  injection, Protonix Labs reviewed: vitamin B12 173, CBG 68-127  NUTRITION - FOCUSED PHYSICAL EXAM:    Most Recent Value  Orbital Region  No depletion  Upper Arm Region  No depletion  Thoracic and Lumbar Region  No depletion  Buccal Region  No depletion  Temple Region  No depletion  Clavicle Bone Region  No depletion  Clavicle and Acromion Bone Region  No depletion  Scapular Bone Region  No depletion  Dorsal Hand  No depletion  Patellar Region  No depletion  Anterior Thigh Region  No depletion  Posterior Calf Region  No depletion  Edema (RD Assessment)  None  Hair  Reviewed  Eyes  Reviewed  Mouth  Reviewed [poor dentition]  Skin  Reviewed  Nails  Reviewed      Diet Order:   Diet Order            Diet - low sodium heart healthy        Diet Heart Room service appropriate? Yes; Fluid consistency: Thin  Diet effective now              EDUCATION NEEDS:   No education needs have been identified at this time  Skin:  Skin Assessment: Reviewed RN Assessment  Last BM:  10/21  Height:   Ht Readings from Last 1 Encounters:  11/28/17 6' (1.829 m)    Weight:   Wt Readings from Last 1 Encounters:  11/28/17 105.5 kg    Ideal Body Weight:  81 kg  BMI:  Body mass index is 31.54 kg/m.  Estimated Nutritional Needs:   Kcal:  2200-2400  Protein:  110-120  Fluid:  >/=2.2 L    [name], Dietetic Intern [phone]

## 2017-11-30 NOTE — Progress Notes (Signed)
Telemetry notified RN that pt's rhythm momentarily went into 2nd degree type 2, HR down to 26. Pt currently SR, HR 82.  On call provider notified, no new orders at this time.

## 2017-11-30 NOTE — Care Management Obs Status (Signed)
[place] NOTIFICATION   Patient Details  Name: [name] MRN: [id number] Date of Birth: 08-20-1950   Medicare Observation Status Notification Given:  Yes(Pt refused to sign, requested that CM call [name]; voice message left with [name])    Royston Bake, RN 11/30/2017, 2:53 PM

## 2017-11-30 NOTE — Discharge Summary (Addendum)
[name] EHO:[id number] DOB: [birth year] DOA: 11/28/2017  PCP: Charlott Rakes, MD  Admit date: 11/28/2017  Discharge date: 11/30/2017  Admitted From: SNF   Disposition:  SNF   Recommendations for Outpatient Follow-up:   Follow up with PCP in 1-2 weeks  PCP Please obtain BMP/CBC, 2 view CXR in 1week,  (see Discharge instructions)   PCP Please follow up on the following pending results:    Home Health: None   Equipment/Devices: None  Consultations: Neuro Discharge Condition: Fair   CODE STATUS: DNR   Diet Recommendation: Heart Healthy    Chief Complaint  Patient presents with  . Altered Mental Status     Brief history of present illness from the day of admission and additional interim summary    RonaldBurlesonis a67 y.o.male,with past medical history of alcohol abuse, smoking, cocaine abuse who was recently admitted for an embolic right MCA CVA causing left-sided hemiparesis most likely due to right carotid artery stenosis who also has history of hypertension, CAD, COPD, dyslipidemia, DVT was brought in by EMS from SNF where he was found to have a sudden change in mental status.   EMS were told by the SNF staff that patient might have taken something but they were not sure what, he came to the ER obtunded had pinpoint pupils, head CT was unremarkable, he received Narcan with some improvement in his mental status. By the time I was called patient is starting to wake up, he does not recall why he was brought here, he has no subjective complaints. ER physician has consulted neurology and requested me to admit the patient to complete his decrease in mental status work-up.                                                                    Hospital Course    1.Acute encephalopathy. Reason  unclear. Apparently this happened suddenly at SNF, urine tox screen negative here, he denies using alcohol recently or doing any drugs, he denies taking any new medications or abusing any medications.   Most likely consistent with new onset seizure, likely brought on by UTI along with underlying recent history of CVA, urine drug screen was negative, MRI did not show any new changes, EEG was nonacute, case discussed with neurologist in detail on 11/30/2017 he will be placed on Keppra 500 twice daily and discharged to SNF, continue his stroke medications unchanged.  2.Recent embolic right MCA stroke with left-sided hemiparesis, most likely due to right carotid artery disease. Renew dual antiplatelet therapy and statin for secondary prevention, PT OT and speech consult. See above #1.[name]  3.History of alcohol abuse, cocaine abuse, smoking. Denies doing any recently, urine tox screen negative, supportive care and monitor.  4.History of right carotid artery disease (stent)with recent embolic stroke. For  now treatment as per #2 above with dual antiplatelet therapy and statin, will require vascular surgery follow-up soon after discharge.  Recommend following with Dr. Gwenlyn Saran in 1 to 2 weeks post discharge.  5.Dyslipidemia. On statin.  6.History of COPD. No acute issue. Supportive care PRN nebulizer treatments and oxygen if needed.  7. Chronic diastolic CHF. Recent EF 60% on echocardiogram done on 10/27/2017.Resume home regimen unchanged upon discharge follow BMP closely at the nursing home.  8.History of GERD. On PPI.  9.History of CAD/PAD/Right-sided carotid artery stenosis. No acute issues, EKG nonacute, continue dual antiplatelet therapy and statin for secondary prevention.  10. Possible left shoulder subluxation.  Stable x-ray, PT OT at SNF.  11.  UTI  Cultures negative.  Responded well to Rocephin clinically, Keflex for more days upon discharge.  12. ? TYPE 2 AV  BLOCK ON TELE - AT 3AM -discussed with Dr. Marlou Porch cardiology reviewed the telemetry strip, no block.  Discharge diagnosis     Principal Problem:   Acute encephalopathy Active Problems:   COPD (chronic obstructive pulmonary disease) (HCC)   Peripheral vascular disease (HCC)   CAD (coronary artery disease)   Alcohol abuse   DVT (deep venous thrombosis) (HCC)   Embolic stroke (HCC)   Cocaine abuse ([place])   Polysubstance abuse ([place])   Essential hypertension   Tobacco use disorder   Cerebral infarction due to stenosis of right carotid artery (HCC)   Diabetes mellitus, type 2 ([place])   Acute ischemic stroke ([place])   AMS (altered mental status)    Discharge instructions    Discharge Instructions    Diet - low sodium heart healthy   Complete by:  As directed    Discharge instructions   Complete by:  As directed    Follow with Primary MD Charlott Rakes, MD in 7 days   Get CBC, CMP, 2 view Chest X ray -  checked  by Primary MD or SNF MD in 5-7 days   Activity: As tolerated with Full fall precautions use walker/cane & assistance as needed  Disposition SNF  Diet: Heart Healthy  with feeding assistance and aspiration precautions.  Special Instructions: If you have smoked or chewed Tobacco  in the last 2 yrs please stop smoking, stop any regular Alcohol  and or any Recreational drug use.  On your next visit with your primary care physician please Get Medicines reviewed and adjusted.  Please request your Prim.MD to go over all Hospital Tests and Procedure/Radiological results at the follow up, please get all Hospital records sent to your Prim MD by signing hospital release before you go home.  If you experience worsening of your admission symptoms, develop shortness of breath, life threatening emergency, suicidal or homicidal thoughts you must seek medical attention immediately by calling 911 or calling your MD immediately  if symptoms less severe.  You Must read complete  instructions/literature along with all the possible adverse reactions/side effects for all the Medicines you take and that have been prescribed to you. Take any new Medicines after you have completely understood and accpet all the possible adverse reactions/side effects.   Do not drive, operate heavy machinery, perform activities at heights, swimming or participation in water activities or provide baby sitting services if your were admitted for syncope or siezures until you have seen by Primary MD or a Neurologist and advised to do so again.   Increase activity slowly   Complete by:  As directed       Discharge Medications  Allergies as of 11/30/2017      Reactions   Darvocet [propoxyphene N-acetaminophen] Hives   Haldol [haloperidol Decanoate] Hives   Acetaminophen Nausea Only   Upset stomach, tolerates Hydrocodone/APAP if taken with food   Metformin And Related Nausea And Vomiting   Norco [hydrocodone-acetaminophen] Nausea And Vomiting   Tolerates if taken with food      Medication List    TAKE these medications   ACCU-CHEK AVIVA device Use 3 times daily before meals.   accu-chek softclix lancets Use 3 times daily before meals   ACCU-CHEK SOFTCLIX LANCETS lancets Use 3 times daily before meals   albuterol 108 (90 Base) MCG/ACT inhaler Commonly known as:  PROVENTIL HFA;VENTOLIN HFA INHALE 2 PUFFS EVERY 6 HOURS AS NEEDED FOR WHEEZING OR SHORTNESS OF BREATH What changed:    how much to take  how to take this  when to take this  reasons to take this  additional instructions   allopurinol 300 MG tablet Commonly known as:  ZYLOPRIM Take 1 tablet (300 mg total) by mouth daily.   aspirin 325 MG tablet Take 1 tablet (325 mg total) by mouth daily. What changed:  how much to take   atorvastatin 80 MG tablet Commonly known as:  LIPITOR Take 1 tablet (80 mg total) by mouth daily at 6 PM.   carvedilol 12.5 MG tablet Commonly known as:  COREG Take 1 tablet (12.5 mg  total) by mouth 2 (two) times daily with a meal.   cephALEXin 500 MG capsule Commonly known as:  KEFLEX Take 1 capsule (500 mg total) by mouth 3 (three) times daily for 4 days. For 4 more days   clopidogrel 75 MG tablet Commonly known as:  PLAVIX Take 1 tablet (75 mg total) by mouth daily.   DULoxetine 60 MG capsule Commonly known as:  CYMBALTA Take 1 capsule (60 mg total) by mouth daily.   Fluticasone-Salmeterol 250-50 MCG/DOSE Aepb Commonly known as:  ADVAIR Inhale 1 puff into the lungs 2 (two) times daily.   furosemide 20 MG tablet Commonly known as:  LASIX Take 2 tablets (40 mg total) by mouth 2 (two) times daily. What changed:  how much to take   glucose blood test strip Use 3 times daily before meals as directed.   hydrocortisone cream 1 % Apply 1 application topically 2 (two) times daily.   hydrOXYzine 25 MG tablet Commonly known as:  ATARAX/VISTARIL Take 25 mg by mouth every 6 (six) hours as needed.   lactulose 10 GM/15ML solution Commonly known as:  CHRONULAC Take 15 mLs (10 g total) by mouth 2 (two) times daily as needed for mild constipation.   levETIRAcetam 500 MG tablet Commonly known as:  KEPPRA Take 1 tablet (500 mg total) by mouth 2 (two) times daily.   memantine 10 MG tablet Commonly known as:  NAMENDA Take 1 tablet (10 mg total) by mouth 2 (two) times daily.   naphazoline-pheniramine 0.025-0.3 % ophthalmic solution Commonly known as:  NAPHCON-A Place 1 drop into the left eye 4 (four) times daily as needed for eye irritation.   pantoprazole 40 MG tablet Commonly known as:  PROTONIX TAKE 1 TABLET BY MOUTH EVERY MORNING What changed:  when to take this   pregabalin 150 MG capsule Commonly known as:  LYRICA Take 1 capsule (150 mg total) by mouth 2 (two) times daily.   ROLLATOR Misc 1 each by Does not apply route daily.   rOPINIRole 0.5 MG tablet Commonly known as:  REQUIP Take 1  tablet (0.5 mg total) by mouth at bedtime.   tiZANidine 4 MG  tablet Commonly known as:  ZANAFLEX Take 1 tablet (4 mg total) by mouth every 8 (eight) hours as needed for muscle spasms.   traZODone 150 MG tablet Commonly known as:  DESYREL Take 1 tablet (150 mg total) by mouth at bedtime as needed for sleep.       Follow-up Information    Charlott Rakes, MD. Schedule an appointment as soon as possible for a visit in 1 week(s).   Specialty:  Family Medicine Contact information: [place] [address] [phone]        [facility]. Schedule an appointment as soon as possible for a visit in 1 week(s).   Specialty:  Neurology Why:  CVA with seizures Contact information: [address] [place] [phone]       Cyndy Freeze, MD. Schedule an appointment as soon as possible for a visit in 1 week(s).   Specialty:  Diagnostic Radiology Why:  Carotid artery stenosis with strokes Contact information: [address] [place] [address] [phone]           Major procedures and Radiology Reports - PLEASE review detailed and final reports thoroughly  -     EEG -   This EEG is characterized by slowing which is consistent with normal drowse.  Can not rule out the possibility of slowing related to general cerebral disturbance such as a metabolic encephalopathy.  Clinical correlation recommended.   COMMENT: The one sharp transient noted during the recording is considered within the limits of normal.      Ct Head Wo Contrast  Result Date: 11/28/2017 CLINICAL DATA:  Altered mental status of unclear cause EXAM: CT HEAD WITHOUT CONTRAST TECHNIQUE: Contiguous axial images were obtained from the base of the skull through the vertex without intravenous contrast. COMPARISON:  10/25/2017 FINDINGS: Brain: Remote right MCA/watershed distribution infarcts. No evidence of acute infarct, acute hemorrhage, hydrocephalus, or collection. Vascular: Right ICA stent.  Skull: No acute finding. Sinuses/Orbits: Prior blowout fracture of the left orbital floor with plating. Bilateral cataract resection. No acute finding IMPRESSION: 1. No acute finding. 2. Remote right cerebral infarcts. Electronically Signed   By: Monte Fantasia M.D.   On: 11/28/2017 16:19   Mr Brain Wo Contrast  Result Date: 11/29/2017 CLINICAL DATA:  Prior stroke with left-sided deficits. Found unresponsive. EXAM: MRI HEAD WITHOUT CONTRAST TECHNIQUE: Multiplanar, multiecho pulse sequences of the brain and surrounding structures were obtained without intravenous contrast. COMPARISON:  Head CT 11/28/2017 Brain MRI 10/26/2017 FINDINGS: BRAIN: There is no acute infarct, acute hemorrhage, hydrocephalus or extra-axial collection. The midline structures are normal. No midline shift or other mass effect. Remote right frontal and parietal infarcts. Early confluent hyperintense T2-weighted signal of the periventricular and deep white matter, most commonly due to chronic ischemic microangiopathy. Generalized atrophy without lobar predilection. Focus of chronic microhemorrhage in the posterior right temporal lobe. VASCULAR: Loss of the normal right ICA flow void at the skull base, unchanged. SKULL AND UPPER CERVICAL SPINE: Calvarial bone marrow signal is normal. There is no skull base mass. Visualized upper cervical spine and soft tissues are normal. SINUSES/ORBITS: No fluid levels or advanced mucosal thickening. No mastoid or middle ear effusion. The orbits are normal. IMPRESSION: 1. No acute intracranial abnormality. 2. Old right frontal and parietal infarcts. 3. Generalized atrophy and chronic ischemic microangiopathy. 4. Unchanged loss of normal right ICA flow void at the  skull base. Electronically Signed   By: Ulyses Jarred M.D.   On: 11/29/2017 23:40   Dg Chest Portable 1 View  Result Date: 11/28/2017 CLINICAL DATA:  Patient found unresponsive today. EXAM: PORTABLE CHEST 1 VIEW COMPARISON:  Single-view of the  chest 09/19/2017. PA and lateral chest 04/13/2017. FINDINGS: There is mild bibasilar atelectasis. No pneumothorax or pleural effusion. Nodular opacity measuring 1.2 cm projects between the right seventh and eighth ribs. It was likely present on the most recent study although more difficult to visualize. Aortic atherosclerosis is noted. No acute bony abnormality. IMPRESSION: No acute disease. 1.2 cm nodular opacity projects in the right mid lung. Nonemergent chest CT with IV contrast could be used for further evaluation after the patient's acute episode has passed. Electronically Signed   By: Inge Rise M.D.   On: 11/28/2017 15:32   Dg Shoulder Left  Result Date: 11/29/2017 CLINICAL DATA:  Shoulder weakness EXAM: LEFT SHOULDER - 2+ VIEW COMPARISON:  None. FINDINGS: No fracture or malalignment. Moderate AC joint degenerative change. Mild glenohumeral degenerative change. Humeral head appears slightly high-riding. IMPRESSION: 1. No acute osseous abnormality 2. Mild to moderate arthritis of the [facility] joint and glenohumeral interval 3. Humeral head appears high-riding, possible rotator cuff disease. Electronically Signed   By: Donavan Foil M.D.   On: 11/29/2017 14:08    Micro Results     Recent Results (from the past 240 hour(s))  MRSA PCR Screening     Status: None   Collection Time: 11/28/17  6:23 PM  Result Value Ref Range Status   MRSA by PCR NEGATIVE NEGATIVE Final    Comment:        The GeneXpert MRSA Assay (FDA approved for NASAL specimens only), is one component of a comprehensive MRSA colonization surveillance program. It is not intended to diagnose MRSA infection nor to guide or monitor treatment for MRSA infections. Performed at [facility], [place] [address] [place], [place] [address]     Today   Subjective    [name] today has no headache,no chest abdominal pain,no new weakness tingling or numbness, feels much better wants to go home today.    Objective     Blood pressure (!) 154/68, pulse 92, temperature 99 F (37.2 C), temperature source Oral, resp. rate 17, height 6' (1.829 m), weight 105.5 kg, SpO2 95 %.   Intake/Output Summary (Last 24 hours) at 11/30/2017 0927 Last data filed at 11/30/2017 0545 Gross per 24 hour  Intake 1275 ml  Output 1930 ml  Net -655 ml    Exam Awake Alert, Oriented x 3, No new F.N deficits, L. Sided paresis which is chronic, normal affect [place].AT,PERRAL Supple Neck,No JVD, No cervical lymphadenopathy appriciated.  Symmetrical Chest wall movement, Good air movement bilaterally, CTAB RRR,No Gallops,Rubs or new Murmurs, No Parasternal Heave +ve B.Sounds, Abd Soft, Non tender, No organomegaly appriciated, No rebound -guarding or rigidity. No Cyanosis, Clubbing or edema, No new Rash or bruise   Data Review   CBC w Diff:  Lab Results  Component Value Date   WBC 7.9 11/29/2017   HGB 13.0 11/29/2017   HCT 43.3 11/29/2017   PLT 158 11/29/2017   LYMPHOPCT 24 10/25/2017   MONOPCT 11 10/25/2017   EOSPCT 3 10/25/2017   BASOPCT 1 10/25/2017    CMP:  Lab Results  Component Value Date   NA 147 (H) 11/29/2017   NA 145 (H) 08/19/2017   K 3.6 11/29/2017   CL 112 (H) 11/29/2017  CO2 27 11/29/2017   BUN 10 11/29/2017   BUN 14 08/19/2017   CREATININE 0.67 11/29/2017   CREATININE CANCELED 04/28/2016   PROT 6.1 (L) 11/29/2017   PROT 6.4 08/19/2017   ALBUMIN 3.4 (L) 11/29/2017   ALBUMIN 4.0 08/19/2017   BILITOT 0.9 11/29/2017   BILITOT 0.3 08/19/2017   ALKPHOS 74 11/29/2017   AST 19 11/29/2017   ALT 17 11/29/2017  .   Total Time in preparing paper work, data evaluation and todays exam - 58 minutes  Lala Lund M.D on 11/30/2017 at 9:27 AM  [facility]   Office  [phone]

## 2017-11-30 NOTE — Care Management CC44 (Signed)
Condition Code 44 Documentation Completed  Patient Details  Name: HEWITT GARNER MRN: [id number] Date of Birth: 12/16/1950   Condition Code 44 given:  Yes Patient signature on Condition Code 44 notice:  (Pt refused to sign, requested that CM call [name]; voice message left with [name])) Documentation of 2 MD's agreement:  Yes Code 44 added to claim:  Yes    Royston Bake, RN 11/30/2017, 2:54 PM

## 2017-12-01 ENCOUNTER — Telehealth: Payer: Self-pay | Admitting: Family Medicine

## 2017-12-01 ENCOUNTER — Ambulatory Visit: Payer: Self-pay | Admitting: Family Medicine

## 2017-12-01 DIAGNOSIS — G9340 Encephalopathy, unspecified: Secondary | ICD-10-CM | POA: Diagnosis not present

## 2017-12-01 LAB — URINE CULTURE: CULTURE: NO GROWTH

## 2017-12-01 MED ORDER — CEPHALEXIN 500 MG PO CAPS
500.0000 mg | ORAL_CAPSULE | Freq: Three times a day (TID) | ORAL | Status: DC
  Administered 2017-12-02 (×2): 500 mg via ORAL
  Filled 2017-12-01 (×2): qty 1

## 2017-12-01 NOTE — Progress Notes (Signed)
Patient was briefly seen and examined-he is awake and alert.  No major issues overnight.  He is stable for discharge-please see discharge summary done by Dr. Candiss Norse yesterday.  Discharge medications as outlined in discharge summary are  up-to-date.

## 2017-12-01 NOTE — Consult Note (Signed)
   [facility] CM Inpatient Consult   12/01/2017  ZAHIR EISENHOUR 03-May-1950 [id number]  Patient screened for re-hospitalization less than 30 days with [facility] plan, currently in observation status. Patient's chart reviewed for [employer] needs.  Patient is from [employer]. Chart review reveals per MD notes from History and Physical:  RonaldBurlesonis a67 y.o.male,with past medical history of alcohol abuse, smoking, cocaine abuse who was recently admitted for an embolic right MCA CVA causing left-sided hemiparesis most likely due to right carotid artery stenosis who also has history of hypertension, CAD, COPD, dyslipidemia, DVT was brought in by EMS from SNF where he was found to have a sudden change in mental status.  EMS were told by the SNF staff that patient might have taken something but they were not sure what, he came to the ER obtunded had pinpoint pupils, head CT was unremarkable, he received Narcan with some improvement in his mental status.  Will follow progress and needs.  Will follow up with inpatient care management team for any needs. Current disposition is for returning to skilled nursing.  For questions contact:   Natividad Brood, RN BSN [facility] Liaison  [phone] business mobile phone Toll free office [phone]

## 2017-12-01 NOTE — Telephone Encounter (Signed)
[facility] to request [facility] nursing orders for 1-1week 2-4weeks 2 PRN's Please follow up [phone] p

## 2017-12-02 DIAGNOSIS — I252 Old myocardial infarction: Secondary | ICD-10-CM | POA: Diagnosis not present

## 2017-12-02 DIAGNOSIS — I5032 Chronic diastolic (congestive) heart failure: Secondary | ICD-10-CM | POA: Diagnosis not present

## 2017-12-02 DIAGNOSIS — I480 Paroxysmal atrial fibrillation: Secondary | ICD-10-CM | POA: Diagnosis not present

## 2017-12-02 DIAGNOSIS — Z8249 Family history of ischemic heart disease and other diseases of the circulatory system: Secondary | ICD-10-CM | POA: Diagnosis not present

## 2017-12-02 DIAGNOSIS — G4733 Obstructive sleep apnea (adult) (pediatric): Secondary | ICD-10-CM | POA: Diagnosis not present

## 2017-12-02 DIAGNOSIS — I10 Essential (primary) hypertension: Secondary | ICD-10-CM | POA: Diagnosis not present

## 2017-12-02 DIAGNOSIS — Z66 Do not resuscitate: Secondary | ICD-10-CM | POA: Diagnosis not present

## 2017-12-02 DIAGNOSIS — Z885 Allergy status to narcotic agent status: Secondary | ICD-10-CM | POA: Diagnosis not present

## 2017-12-02 DIAGNOSIS — M2550 Pain in unspecified joint: Secondary | ICD-10-CM | POA: Diagnosis not present

## 2017-12-02 DIAGNOSIS — R569 Unspecified convulsions: Secondary | ICD-10-CM | POA: Diagnosis not present

## 2017-12-02 DIAGNOSIS — Z888 Allergy status to other drugs, medicaments and biological substances status: Secondary | ICD-10-CM | POA: Diagnosis not present

## 2017-12-02 DIAGNOSIS — M1000 Idiopathic gout, unspecified site: Secondary | ICD-10-CM | POA: Diagnosis not present

## 2017-12-02 DIAGNOSIS — K219 Gastro-esophageal reflux disease without esophagitis: Secondary | ICD-10-CM | POA: Diagnosis not present

## 2017-12-02 DIAGNOSIS — Z79899 Other long term (current) drug therapy: Secondary | ICD-10-CM | POA: Diagnosis not present

## 2017-12-02 DIAGNOSIS — I69354 Hemiplegia and hemiparesis following cerebral infarction affecting left non-dominant side: Secondary | ICD-10-CM | POA: Diagnosis not present

## 2017-12-02 DIAGNOSIS — R2681 Unsteadiness on feet: Secondary | ICD-10-CM | POA: Diagnosis not present

## 2017-12-02 DIAGNOSIS — E118 Type 2 diabetes mellitus with unspecified complications: Secondary | ICD-10-CM | POA: Diagnosis not present

## 2017-12-02 DIAGNOSIS — E1151 Type 2 diabetes mellitus with diabetic peripheral angiopathy without gangrene: Secondary | ICD-10-CM | POA: Diagnosis not present

## 2017-12-02 DIAGNOSIS — M6281 Muscle weakness (generalized): Secondary | ICD-10-CM | POA: Diagnosis not present

## 2017-12-02 DIAGNOSIS — I739 Peripheral vascular disease, unspecified: Secondary | ICD-10-CM | POA: Diagnosis not present

## 2017-12-02 DIAGNOSIS — Z7401 Bed confinement status: Secondary | ICD-10-CM | POA: Diagnosis not present

## 2017-12-02 DIAGNOSIS — I5033 Acute on chronic diastolic (congestive) heart failure: Secondary | ICD-10-CM | POA: Diagnosis not present

## 2017-12-02 DIAGNOSIS — G2581 Restless legs syndrome: Secondary | ICD-10-CM | POA: Diagnosis not present

## 2017-12-02 DIAGNOSIS — I110 Hypertensive heart disease with heart failure: Secondary | ICD-10-CM | POA: Diagnosis not present

## 2017-12-02 DIAGNOSIS — J449 Chronic obstructive pulmonary disease, unspecified: Secondary | ICD-10-CM | POA: Diagnosis not present

## 2017-12-02 DIAGNOSIS — G9340 Encephalopathy, unspecified: Secondary | ICD-10-CM | POA: Diagnosis not present

## 2017-12-02 DIAGNOSIS — I6521 Occlusion and stenosis of right carotid artery: Secondary | ICD-10-CM | POA: Diagnosis not present

## 2017-12-02 DIAGNOSIS — R404 Transient alteration of awareness: Secondary | ICD-10-CM | POA: Diagnosis not present

## 2017-12-02 DIAGNOSIS — E785 Hyperlipidemia, unspecified: Secondary | ICD-10-CM | POA: Diagnosis not present

## 2017-12-02 DIAGNOSIS — I2510 Atherosclerotic heart disease of native coronary artery without angina pectoris: Secondary | ICD-10-CM | POA: Diagnosis not present

## 2017-12-02 DIAGNOSIS — I639 Cerebral infarction, unspecified: Secondary | ICD-10-CM | POA: Diagnosis not present

## 2017-12-02 NOTE — Progress Notes (Signed)
Physical Therapy Treatment Patient Details Name: [name] MRN: [id number] DOB: 1951/01/31 Today's Date: 12/02/2017    History of Present Illness [name]  is a 67 y.o. male, with past medical history of alcohol abuse, smoking, cocaine abuse who was recently admitted for an embolic right MCA CVA causing left-sided hemiparesis most likely due to right carotid artery stenosis who also has history of hypertension, CAD, COPD, dyslipidemia, DVT was brought in by EMS from SNF where he was found to have a sudden change in mental status.    PT Comments    Patient making good progress towards goals. Motivated to work with PT/OT this session. Patient with physical assist needed to power up at bedside and with gait due to L LE/UE weakness. Cueing provided throughout session for safety and sequencing of gait and AD. Great progress this session. Will continue to follow.     Follow Up Recommendations  SNF;Supervision/Assistance - 24 hour     Equipment Recommendations  None recommended by PT    Recommendations for Other Services       Precautions / Restrictions Precautions Precautions: Fall Restrictions Weight Bearing Restrictions: No    Mobility  Bed Mobility Overal bed mobility: Needs Assistance Bed Mobility: Supine to Sit     Supine to sit: Mod assist     General bed mobility comments: assist to elevate trunk; cues to scoot hips towards EOB  Transfers Overall transfer level: Needs assistance Equipment used: None Transfers: Sit to/from Stand;Stand Pivot Transfers Sit to Stand: +2 physical assistance;Min assist;+2 safety/equipment;From elevated surface Stand pivot transfers: Min assist;+2 safety/equipment       General transfer comment: assist to rise and steady from slightly elevated EOB; VCs hand placement, assist for maintaining LUE on RW in standing  Ambulation/Gait Ambulation/Gait assistance: Min assist;+2 safety/equipment Gait Distance (Feet): 50  Feet Assistive device: Rolling walker (2 wheeled) Gait Pattern/deviations: Step-to pattern;Decreased weight shift to left;Decreased step length - left;Decreased stance time - left;Decreased dorsiflexion - left Gait velocity: decreased   General Gait Details: difficulty maintaining L UE on walker with PT providing physical assist; cueing for larger step length adn foot clearance to prevent falls.   Stairs             Wheelchair Mobility    Modified Rankin (Stroke Patients Only) Modified Rankin (Stroke Patients Only) Pre-Morbid Rankin Score: Moderately severe disability Modified Rankin: Severe disability     Balance Overall balance assessment: Needs assistance;History of Falls Sitting-balance support: No upper extremity supported;Feet supported Sitting balance-Leahy Scale: Good     Standing balance support: Bilateral upper extremity supported;During functional activity Standing balance-Leahy Scale: Poor Standing balance comment: reliant on UE/external assist for standing balance (overall minA), +2 for safety                            Cognition Arousal/Alertness: Awake/alert Behavior During Therapy: WFL for tasks assessed/performed;Impulsive Overall Cognitive Status: Impaired/Different from baseline Area of Impairment: Following commands;Safety/judgement;Problem solving                       Following Commands: Follows one step commands consistently;Follows one step commands with increased time Safety/Judgement: Decreased awareness of safety;Decreased awareness of deficits   Problem Solving: Slow processing;Decreased initiation;Difficulty sequencing;Requires verbal cues        Exercises      General Comments        Pertinent Vitals/Pain Pain Assessment: Faces Faces Pain Scale: Hurts a little  bit Pain Location: "all over" Pain Descriptors / Indicators: Sore Pain Intervention(s): Limited activity within patient's tolerance;Monitored during  session    Home Living                      Prior Function            PT Goals (current goals can now be found in the care plan section) Acute Rehab PT Goals Patient Stated Goal: hopeful for d/c today PT Goal Formulation: With patient Time For Goal Achievement: 12/13/17 Potential to Achieve Goals: Fair Progress towards PT goals: Progressing toward goals    Frequency    Min 2X/week      PT Plan Current plan remains appropriate    Co-evaluation PT/OT/SLP Co-Evaluation/Treatment: Yes Reason for Co-Treatment: For patient/therapist safety;To address functional/ADL transfers PT goals addressed during session: Mobility/safety with mobility;Balance;Proper use of DME OT goals addressed during session: ADL's and self-care;Proper use of Adaptive equipment and DME      AM-PAC PT "6 Clicks" Daily Activity  Outcome Measure  Difficulty turning over in bed (including adjusting bedclothes, sheets and blankets)?: A Little Difficulty moving from lying on back to sitting on the side of the bed? : Unable Difficulty sitting down on and standing up from a chair with arms (e.g., wheelchair, bedside commode, etc,.)?: Unable Help needed moving to and from a bed to chair (including a wheelchair)?: A Little Help needed walking in hospital room?: A Little Help needed climbing 3-5 steps with a railing? : A Lot 6 Click Score: 13    End of Session Equipment Utilized During Treatment: Gait belt Activity Tolerance: Patient tolerated treatment well Patient left: in chair;with call bell/phone within reach;with chair alarm set Nurse Communication: Mobility status PT Visit Diagnosis: Unsteadiness on feet (R26.81);Repeated falls (R29.6);Hemiplegia and hemiparesis;Difficulty in walking, not elsewhere classified (R26.2);Pain Hemiplegia - Right/Left: Left Hemiplegia - dominant/non-dominant: Non-dominant Hemiplegia - caused by: Cerebral infarction Pain - Right/Left: Left Pain - part of body:  Shoulder     Time: 1245-8099 PT Time Calculation (min) (ACUTE ONLY): 24 min  Charges:  $Gait Training: 8-22 mins                     Lanney Gins, PT, DPT Supplemental Physical Therapist 12/02/17 3:35 PM Pager: [phone] Office: [phone]

## 2017-12-02 NOTE — Progress Notes (Signed)
[facility] has received insurance approval for patient to discharge today.  Percell Locus Nishanth Mccaughan LCSW [phone]

## 2017-12-02 NOTE — Plan of Care (Signed)
  Problem: Education: Goal: Knowledge of General Education information will improve Description Including pain rating scale, medication(s)/side effects and non-pharmacologic comfort measures Outcome: Adequate for Discharge   Problem: Health Behavior/Discharge Planning: Goal: Ability to manage health-related needs will improve Outcome: Adequate for Discharge   Problem: Clinical Measurements: Goal: Ability to maintain clinical measurements within normal limits will improve Outcome: Adequate for Discharge Goal: Will remain free from infection Outcome: Adequate for Discharge Goal: Diagnostic test results will improve Outcome: Adequate for Discharge Goal: Respiratory complications will improve Outcome: Adequate for Discharge Goal: Cardiovascular complication will be avoided Outcome: Adequate for Discharge   Problem: Elimination: Goal: Will not experience complications related to bowel motility Outcome: Adequate for Discharge Goal: Will not experience complications related to urinary retention Outcome: Adequate for Discharge   Problem: Pain Managment: Goal: General experience of comfort will improve Outcome: Adequate for Discharge   Problem: Skin Integrity: Goal: Risk for impaired skin integrity will decrease Outcome: Adequate for Discharge   Problem: Nutrition: Goal: Risk of aspiration will decrease Outcome: Adequate for Discharge Goal: Dietary intake will improve Outcome: Adequate for Discharge   Problem: Ischemic Stroke/TIA Tissue Perfusion: Goal: Complications of ischemic stroke/TIA will be minimized Outcome: Adequate for Discharge

## 2017-12-02 NOTE — Clinical Social Work Placement (Signed)
   CLINICAL SOCIAL WORK PLACEMENT  NOTE  Date:  12/02/2017  Patient Details  Name: [name] MRN: [id number] Date of Birth: Aug 13, 1950  Clinical Social Work is seeking post-discharge placement for this patient at the [place] level of care (*CSW will initial, date and re-position this form in  chart as items are completed):  Yes   Patient/family provided with [place] Work Department's list of facilities offering this level of care within the geographic area requested by the patient (or if unable, by the patient's family).  Yes   Patient/family informed of their freedom to choose among providers that offer the needed level of care, that participate in Medicare, Medicaid or managed care program needed by the patient, have an available bed and are willing to accept the patient.  Yes   Patient/family informed of [facility]'s ownership interest in [facility] and [facility], as well as of the fact that they are under no obligation to receive care at these facilities.  PASRR submitted to EDS on       PASRR number received on       Existing PASRR number confirmed on 11/30/17     FL2 transmitted to all facilities in geographic area requested by pt/family on 11/30/17     FL2 transmitted to all facilities within larger geographic area on       Patient informed that his/her managed care company has contracts with or will negotiate with certain facilities, including the following:        Yes   Patient/family informed of bed offers received.  Patient chooses bed [facility]     Physician recommends and patient chooses bed at      Patient to be transferred to [facility] on 12/02/17.  Patient to be transferred to facility by [facility]     Patient family notified on 12/02/17 of transfer.  Name of family member notified:  [name]     PHYSICIAN       Additional Comment:     _______________________________________________ Benard Halsted, LCSW 12/02/2017, 10:01 AM

## 2017-12-02 NOTE — Progress Notes (Signed)
CSW still awaiting insurance approval.   Cedric Fishman LCSW [phone]

## 2017-12-02 NOTE — Progress Notes (Signed)
Occupational Therapy Treatment Patient Details Name: [name] MRN: [id number] DOB: Mar 27, 1950 Today's Date: 12/02/2017    History of present illness [name]  is a 67 y.o. male, with past medical history of alcohol abuse, smoking, cocaine abuse who was recently admitted for an embolic right MCA CVA causing left-sided hemiparesis most likely due to right carotid artery stenosis who also has history of hypertension, CAD, COPD, dyslipidemia, DVT was brought in by EMS from SNF where he was found to have a sudden change in mental status.   OT comments  Pt with steady progress towards OT goals. Pt with improvements in functional mobility using RW, completing with overall minA+2. Pt requiring min-modA for completion of simple grooming ADLs, with increased assist required to incorporate LUE into task completion. Pt continues to require up to [place] for UB ADL, setup assist for self-feeding ADLs. Continue to recommend SNF level therapy services at time of discharge. Will continue to follow while he remains in acute setting to progress pt towards established OT goals.    Follow Up Recommendations  SNF;Supervision/Assistance - 24 hour    Equipment Recommendations  Other (comment)(defer to next venue)          Precautions / Restrictions Precautions Precautions: Fall Restrictions Weight Bearing Restrictions: No       Mobility Bed Mobility Overal bed mobility: Needs Assistance Bed Mobility: Supine to Sit     Supine to sit: Mod assist     General bed mobility comments: assist to elevate trunk; cues to scoot hips towards EOB  Transfers Overall transfer level: Needs assistance Equipment used: None Transfers: Sit to/from Stand;Stand Pivot Transfers Sit to Stand: +2 physical assistance;Min assist;+2 safety/equipment;From elevated surface         General transfer comment: assist to rise and steady from slightly elevated EOB; VCs hand placement, assist for maintaining LUE on  RW in standing    Balance Overall balance assessment: Needs assistance;History of Falls Sitting-balance support: No upper extremity supported;Feet supported Sitting balance-Leahy Scale: Good     Standing balance support: Bilateral upper extremity supported;During functional activity Standing balance-Leahy Scale: Poor Standing balance comment: reliant on UE/external assist for standing balance (overall minA), +2 for safety                           ADL either performed or assessed with clinical judgement   ADL Overall ADL's : Needs assistance/impaired Eating/Feeding: Minimal assistance;Set up;Sitting Eating/Feeding Details (indicate cue type and reason): setup for opening containers/applying condiments Grooming: Wash/dry face;Minimal assistance;Moderate assistance;Standing Grooming Details (indicate cue type and reason): minA standing balance; increased assist to incorporate LUE into bimanual task         Upper Body Dressing : Moderate assistance;Sitting Upper Body Dressing Details (indicate cue type and reason): to don second gown, increased effort/assist to thread LUE                  Functional mobility during ADLs: Minimal assistance;+2 for physical assistance;+2 for safety/equipment;Rolling walker General ADL Comments: pt with improvements in mobility and standing activity tolerance this session; continues to demonstrate LUE impairments, decreased dynamic balance and impulsivity                       Cognition Arousal/Alertness: Awake/alert Behavior During Therapy: WFL for tasks assessed/performed;Impulsive Overall Cognitive Status: Impaired/Different from baseline Area of Impairment: Safety/judgement;Following commands  Following Commands: Follows one step commands consistently;Follows one step commands with increased time Safety/Judgement: Decreased awareness of safety;Decreased awareness of deficits               Exercises     Shoulder Instructions       General Comments      Pertinent Vitals/ Pain       Pain Assessment: Faces Faces Pain Scale: Hurts a little bit Pain Location: "all over" Pain Descriptors / Indicators: Sore Pain Intervention(s): Monitored during session  Home Living                                          Prior Functioning/Environment              Frequency  Min 2X/week        Progress Toward Goals  OT Goals(current goals can now be found in the care plan section)  Progress towards OT goals: Progressing toward goals  Acute Rehab OT Goals Patient Stated Goal: hopeful for d/c today OT Goal Formulation: With patient Time For Goal Achievement: 12/13/17 Potential to Achieve Goals: Good ADL Goals Pt Will Perform Grooming: with set-up;sitting Pt Will Perform Upper Body Dressing: with set-up;sitting Pt Will Perform Lower Body Dressing: with mod assist;sit to/from stand Pt Will Transfer to Toilet: with min assist;stand pivot transfer;bedside commode Pt Will Perform Toileting - Clothing Manipulation and hygiene: with min assist;sitting/lateral leans Pt/caregiver will Perform Home Exercise Program: Left upper extremity;Increased ROM;Increased strength;With written HEP provided  Plan Discharge plan remains appropriate    Co-evaluation    PT/OT/SLP Co-Evaluation/Treatment: Yes Reason for Co-Treatment: For patient/therapist safety;To address functional/ADL transfers   OT goals addressed during session: ADL's and self-care;Proper use of Adaptive equipment and DME      AM-PAC PT "6 Clicks" Daily Activity     Outcome Measure   Help from another person eating meals?: A Little Help from another person taking care of personal grooming?: A Little Help from another person toileting, which includes using toliet, bedpan, or urinal?: A Lot Help from another person bathing (including washing, rinsing, drying)?: A Lot Help from another person to  put on and taking off regular upper body clothing?: A Lot Help from another person to put on and taking off regular lower body clothing?: A Lot 6 Click Score: 14    End of Session Equipment Utilized During Treatment: Gait belt;Rolling walker(unable to use RW properly with LUE)  OT Visit Diagnosis: Unsteadiness on feet (R26.81);Other abnormalities of gait and mobility (R26.89);Repeated falls (R29.6);Hemiplegia and hemiparesis;History of falling (Z91.81);Other symptoms and signs involving cognitive function;Other symptoms and signs involving the nervous system (R29.898) Hemiplegia - Right/Left: Left Hemiplegia - dominant/non-dominant: Non-Dominant Hemiplegia - caused by: Cerebral infarction   Activity Tolerance Patient tolerated treatment well   Patient Left in chair;with call bell/phone within reach;with chair alarm set   Nurse Communication Mobility status        Time: 2633-3545 OT Time Calculation (min): 24 min  Charges: OT General Charges $OT Visit: 1 Visit OT Treatments $Self Care/Home Management : 8-22 mins  Lou Cal, OT Supplemental Rehabilitation Services Pager [phone] Office [phone]    [name] 12/02/2017, 2:15 PM

## 2017-12-02 NOTE — Progress Notes (Signed)
Patient will DC to: [facility] Anticipated DC date: 12/02/17 Family notified: [name] Transport by: Domenica Reamer   Per MD patient ready for DC to [facility]. RN, patient, patient's family, and facility notified of DC. Discharge Summary and FL2 sent to facility. RN to call report prior to discharge [phone]). DC packet on chart. Ambulance transport requested for patient.   CSW will sign off for now as social work intervention is no longer needed. Please consult [place] again if new needs arise.  Cedric Fishman, LCSW Clinical Social Worker [phone]

## 2017-12-02 NOTE — Progress Notes (Signed)
Patient was briefly seen and examined-he has some mild back pain but no major issues.  Remains seizure-free.  Vital signs are stable.  Discharge medications outlined in the discharge summary done by Dr. Candiss Norse on 10/22 remain up today.  He remained stable for discharge.  Can discharge to SNF when bed available.

## 2017-12-02 NOTE — Telephone Encounter (Signed)
[name] was called and given verbal orders for skilled nursing as listed below.

## 2017-12-02 NOTE — Progress Notes (Signed)
   12/02/17 1400  Clinical Encounter Type  Visited With Patient  Visit Type Follow-up  Referral From Nurse  Spiritual Encounters  Spiritual Needs Other (Comment)  Stress Factors  Patient Stress Factors Exhausted;Financial concerns;Major life changes   Responded to spiritual consult at 2:15pm for an AD. PT was alert and concerned about his care after possibly being discharged soon. Also, he stated that his copy of the AD was taken by his aide and left for the day. PT stated he did not know if his copy was the same as the copy given by the Hospital. I offered him another copy of the AD and mentioned an overview of the document. I offered spiritual care with ministry of presence and a listening ear.   Chaplain Fidel Levy  [phone]

## 2017-12-07 DIAGNOSIS — I2510 Atherosclerotic heart disease of native coronary artery without angina pectoris: Secondary | ICD-10-CM | POA: Diagnosis not present

## 2017-12-07 DIAGNOSIS — I739 Peripheral vascular disease, unspecified: Secondary | ICD-10-CM | POA: Diagnosis not present

## 2017-12-07 DIAGNOSIS — I10 Essential (primary) hypertension: Secondary | ICD-10-CM | POA: Diagnosis not present

## 2017-12-07 DIAGNOSIS — J449 Chronic obstructive pulmonary disease, unspecified: Secondary | ICD-10-CM | POA: Diagnosis not present

## 2017-12-17 DIAGNOSIS — I2510 Atherosclerotic heart disease of native coronary artery without angina pectoris: Secondary | ICD-10-CM | POA: Diagnosis not present

## 2017-12-17 DIAGNOSIS — M6281 Muscle weakness (generalized): Secondary | ICD-10-CM | POA: Diagnosis not present

## 2017-12-17 DIAGNOSIS — J449 Chronic obstructive pulmonary disease, unspecified: Secondary | ICD-10-CM | POA: Diagnosis not present

## 2017-12-17 DIAGNOSIS — G9340 Encephalopathy, unspecified: Secondary | ICD-10-CM | POA: Diagnosis not present

## 2017-12-21 DIAGNOSIS — J449 Chronic obstructive pulmonary disease, unspecified: Secondary | ICD-10-CM | POA: Diagnosis not present

## 2017-12-23 ENCOUNTER — Telehealth: Payer: Self-pay | Admitting: Family Medicine

## 2017-12-23 NOTE — Telephone Encounter (Signed)
Patient caregiver called and stated she spoke with [facility] and she was rude.

## 2017-12-23 NOTE — Telephone Encounter (Signed)
Spoke with [name] patients caregiver and she stated she had called to check on the patients appointment date. [name] stated that [place] was rude and nasty on the phone and was unable to provide her with the information since patient has not signed the DPR giving authorization to speak with the caregiver. I explained to to [place] that I would talk to [place] regarding the situation.

## 2017-12-27 ENCOUNTER — Ambulatory Visit: Payer: Medicare Other | Admitting: Neurology

## 2017-12-27 ENCOUNTER — Encounter: Payer: Self-pay | Admitting: Neurology

## 2017-12-27 ENCOUNTER — Telehealth: Payer: Self-pay

## 2017-12-27 NOTE — Telephone Encounter (Signed)
Patient no show for appt today. 

## 2017-12-30 ENCOUNTER — Telehealth: Payer: Self-pay | Admitting: Family Medicine

## 2017-12-30 DIAGNOSIS — Z96659 Presence of unspecified artificial knee joint: Secondary | ICD-10-CM | POA: Diagnosis not present

## 2017-12-30 DIAGNOSIS — M109 Gout, unspecified: Secondary | ICD-10-CM | POA: Diagnosis not present

## 2017-12-30 DIAGNOSIS — E1151 Type 2 diabetes mellitus with diabetic peripheral angiopathy without gangrene: Secondary | ICD-10-CM | POA: Diagnosis not present

## 2017-12-30 DIAGNOSIS — I5031 Acute diastolic (congestive) heart failure: Secondary | ICD-10-CM | POA: Diagnosis not present

## 2017-12-30 DIAGNOSIS — E1140 Type 2 diabetes mellitus with diabetic neuropathy, unspecified: Secondary | ICD-10-CM | POA: Diagnosis not present

## 2017-12-30 DIAGNOSIS — E1121 Type 2 diabetes mellitus with diabetic nephropathy: Secondary | ICD-10-CM | POA: Diagnosis not present

## 2017-12-30 DIAGNOSIS — I2510 Atherosclerotic heart disease of native coronary artery without angina pectoris: Secondary | ICD-10-CM | POA: Diagnosis not present

## 2017-12-30 DIAGNOSIS — I69398 Other sequelae of cerebral infarction: Secondary | ICD-10-CM | POA: Diagnosis not present

## 2017-12-30 DIAGNOSIS — I5042 Chronic combined systolic (congestive) and diastolic (congestive) heart failure: Secondary | ICD-10-CM | POA: Diagnosis not present

## 2017-12-30 DIAGNOSIS — I480 Paroxysmal atrial fibrillation: Secondary | ICD-10-CM | POA: Diagnosis not present

## 2017-12-30 DIAGNOSIS — Z9181 History of falling: Secondary | ICD-10-CM | POA: Diagnosis not present

## 2017-12-30 DIAGNOSIS — I69354 Hemiplegia and hemiparesis following cerebral infarction affecting left non-dominant side: Secondary | ICD-10-CM | POA: Diagnosis not present

## 2017-12-30 DIAGNOSIS — E7849 Other hyperlipidemia: Secondary | ICD-10-CM | POA: Diagnosis not present

## 2017-12-30 DIAGNOSIS — I110 Hypertensive heart disease with heart failure: Secondary | ICD-10-CM | POA: Diagnosis not present

## 2017-12-30 DIAGNOSIS — I69328 Other speech and language deficits following cerebral infarction: Secondary | ICD-10-CM | POA: Diagnosis not present

## 2017-12-30 DIAGNOSIS — J449 Chronic obstructive pulmonary disease, unspecified: Secondary | ICD-10-CM | POA: Diagnosis not present

## 2017-12-30 DIAGNOSIS — G2581 Restless legs syndrome: Secondary | ICD-10-CM | POA: Diagnosis not present

## 2017-12-30 DIAGNOSIS — G4733 Obstructive sleep apnea (adult) (pediatric): Secondary | ICD-10-CM | POA: Diagnosis not present

## 2017-12-30 DIAGNOSIS — H538 Other visual disturbances: Secondary | ICD-10-CM | POA: Diagnosis not present

## 2017-12-30 NOTE — Telephone Encounter (Signed)
[name] with [facility] called to inform that POA called to discontinue services and was asked to confirm if patient is alert and aware and was told they were.  POA says the patient was upset because  He was moved to a different facility. Please follow up.

## 2017-12-30 NOTE — Telephone Encounter (Signed)
Will route to PCP for review. 

## 2017-12-31 ENCOUNTER — Telehealth: Payer: Self-pay | Admitting: Family Medicine

## 2017-12-31 NOTE — Telephone Encounter (Signed)
[name] with [facility] called to inform that patient has refused services. Please follow up.

## 2017-12-31 NOTE — Telephone Encounter (Signed)
[name] from [place] called to request verbal orders for skilled Nursing 1week-4 Please follow up   -[phone] p

## 2017-12-31 NOTE — Telephone Encounter (Signed)
Call placed to [facility] with [facility]  She said that she was not sure if the patient was home as the caregiver would not speak to her.    Call placed to [facility], caregiver. She explained that the patient has been home for less than 2 weeks. She would like for him to have home PT/OT but does not want [facility].  She would like the services to be from [place] and said that she would call [facility] and request that the referral for home health be made to [place].   [name] explained that the [facility] lost the patient's cord to charge his power chair. Instructed her to call [facility] to inquire what, if anything,  is needed to obtain a new cord,   She also confirmed the patient's appointment with Dr Margarita Rana  - 01/12/18 @ 1350 and said that she would be accompanying the patient.

## 2017-12-31 NOTE — Telephone Encounter (Signed)
Could you please follow-up on this?  Would we need to make any subsequent referral or if he declining PCS services altogether?  Thank you.

## 2018-01-03 NOTE — Telephone Encounter (Signed)
[name] was called and given the verbal orders.

## 2018-01-03 NOTE — Telephone Encounter (Signed)
Thanks for the update

## 2018-01-12 ENCOUNTER — Emergency Department (HOSPITAL_COMMUNITY): Payer: Medicare Other

## 2018-01-12 ENCOUNTER — Encounter (HOSPITAL_COMMUNITY): Payer: Self-pay

## 2018-01-12 ENCOUNTER — Telehealth: Payer: Self-pay

## 2018-01-12 ENCOUNTER — Other Ambulatory Visit: Payer: Self-pay

## 2018-01-12 ENCOUNTER — Inpatient Hospital Stay (HOSPITAL_COMMUNITY)
Admission: EM | Admit: 2018-01-12 | Discharge: 2018-01-18 | DRG: 065 | Disposition: A | Discharge status: Rehabilitation Facility | Payer: Medicare Other | Attending: Internal Medicine | Admitting: Internal Medicine

## 2018-01-12 ENCOUNTER — Ambulatory Visit (HOSPITAL_BASED_OUTPATIENT_CLINIC_OR_DEPARTMENT_OTHER): Payer: Medicare Other | Admitting: Family Medicine

## 2018-01-12 VITALS — BP 143/81 | HR 88 | Temp 97.5°F | Ht 72.0 in | Wt 218.0 lb

## 2018-01-12 DIAGNOSIS — F0280 Dementia in other diseases classified elsewhere without behavioral disturbance: Secondary | ICD-10-CM

## 2018-01-12 DIAGNOSIS — I5032 Chronic diastolic (congestive) heart failure: Secondary | ICD-10-CM | POA: Diagnosis present

## 2018-01-12 DIAGNOSIS — Z7902 Long term (current) use of antithrombotics/antiplatelets: Secondary | ICD-10-CM | POA: Insufficient documentation

## 2018-01-12 DIAGNOSIS — R911 Solitary pulmonary nodule: Secondary | ICD-10-CM | POA: Insufficient documentation

## 2018-01-12 DIAGNOSIS — Z7951 Long term (current) use of inhaled steroids: Secondary | ICD-10-CM

## 2018-01-12 DIAGNOSIS — M62838 Other muscle spasm: Secondary | ICD-10-CM

## 2018-01-12 DIAGNOSIS — K219 Gastro-esophageal reflux disease without esophagitis: Secondary | ICD-10-CM

## 2018-01-12 DIAGNOSIS — Z86718 Personal history of other venous thrombosis and embolism: Secondary | ICD-10-CM

## 2018-01-12 DIAGNOSIS — W19XXXA Unspecified fall, initial encounter: Secondary | ICD-10-CM | POA: Diagnosis not present

## 2018-01-12 DIAGNOSIS — W1830XA Fall on same level, unspecified, initial encounter: Secondary | ICD-10-CM | POA: Diagnosis present

## 2018-01-12 DIAGNOSIS — E1151 Type 2 diabetes mellitus with diabetic peripheral angiopathy without gangrene: Secondary | ICD-10-CM | POA: Diagnosis present

## 2018-01-12 DIAGNOSIS — R29810 Facial weakness: Secondary | ICD-10-CM | POA: Diagnosis not present

## 2018-01-12 DIAGNOSIS — Z8673 Personal history of transient ischemic attack (TIA), and cerebral infarction without residual deficits: Secondary | ICD-10-CM | POA: Diagnosis not present

## 2018-01-12 DIAGNOSIS — Z955 Presence of coronary angioplasty implant and graft: Secondary | ICD-10-CM | POA: Insufficient documentation

## 2018-01-12 DIAGNOSIS — I6521 Occlusion and stenosis of right carotid artery: Secondary | ICD-10-CM | POA: Diagnosis present

## 2018-01-12 DIAGNOSIS — Z888 Allergy status to other drugs, medicaments and biological substances status: Secondary | ICD-10-CM

## 2018-01-12 DIAGNOSIS — I2510 Atherosclerotic heart disease of native coronary artery without angina pectoris: Secondary | ICD-10-CM | POA: Diagnosis not present

## 2018-01-12 DIAGNOSIS — I509 Heart failure, unspecified: Secondary | ICD-10-CM

## 2018-01-12 DIAGNOSIS — Z7982 Long term (current) use of aspirin: Secondary | ICD-10-CM | POA: Insufficient documentation

## 2018-01-12 DIAGNOSIS — G8194 Hemiplegia, unspecified affecting left nondominant side: Secondary | ICD-10-CM | POA: Diagnosis present

## 2018-01-12 DIAGNOSIS — Z885 Allergy status to narcotic agent status: Secondary | ICD-10-CM

## 2018-01-12 DIAGNOSIS — J449 Chronic obstructive pulmonary disease, unspecified: Secondary | ICD-10-CM | POA: Diagnosis present

## 2018-01-12 DIAGNOSIS — F17210 Nicotine dependence, cigarettes, uncomplicated: Secondary | ICD-10-CM | POA: Diagnosis present

## 2018-01-12 DIAGNOSIS — E7800 Pure hypercholesterolemia, unspecified: Secondary | ICD-10-CM | POA: Insufficient documentation

## 2018-01-12 DIAGNOSIS — F17200 Nicotine dependence, unspecified, uncomplicated: Secondary | ICD-10-CM | POA: Diagnosis present

## 2018-01-12 DIAGNOSIS — I110 Hypertensive heart disease with heart failure: Secondary | ICD-10-CM | POA: Insufficient documentation

## 2018-01-12 DIAGNOSIS — Z66 Do not resuscitate: Secondary | ICD-10-CM | POA: Diagnosis present

## 2018-01-12 DIAGNOSIS — I639 Cerebral infarction, unspecified: Secondary | ICD-10-CM | POA: Diagnosis not present

## 2018-01-12 DIAGNOSIS — R918 Other nonspecific abnormal finding of lung field: Secondary | ICD-10-CM

## 2018-01-12 DIAGNOSIS — I6340 Cerebral infarction due to embolism of unspecified cerebral artery: Secondary | ICD-10-CM | POA: Diagnosis not present

## 2018-01-12 DIAGNOSIS — M1990 Unspecified osteoarthritis, unspecified site: Secondary | ICD-10-CM

## 2018-01-12 DIAGNOSIS — I252 Old myocardial infarction: Secondary | ICD-10-CM

## 2018-01-12 DIAGNOSIS — M109 Gout, unspecified: Secondary | ICD-10-CM | POA: Diagnosis present

## 2018-01-12 DIAGNOSIS — M1000 Idiopathic gout, unspecified site: Secondary | ICD-10-CM

## 2018-01-12 DIAGNOSIS — E785 Hyperlipidemia, unspecified: Secondary | ICD-10-CM

## 2018-01-12 DIAGNOSIS — G40909 Epilepsy, unspecified, not intractable, without status epilepticus: Secondary | ICD-10-CM | POA: Diagnosis present

## 2018-01-12 DIAGNOSIS — F329 Major depressive disorder, single episode, unspecified: Secondary | ICD-10-CM | POA: Diagnosis present

## 2018-01-12 DIAGNOSIS — E119 Type 2 diabetes mellitus without complications: Secondary | ICD-10-CM

## 2018-01-12 DIAGNOSIS — I63411 Cerebral infarction due to embolism of right middle cerebral artery: Principal | ICD-10-CM | POA: Diagnosis present

## 2018-01-12 DIAGNOSIS — Z6829 Body mass index (BMI) 29.0-29.9, adult: Secondary | ICD-10-CM

## 2018-01-12 DIAGNOSIS — F1020 Alcohol dependence, uncomplicated: Secondary | ICD-10-CM | POA: Diagnosis present

## 2018-01-12 DIAGNOSIS — R471 Dysarthria and anarthria: Secondary | ICD-10-CM | POA: Diagnosis not present

## 2018-01-12 DIAGNOSIS — I63231 Cerebral infarction due to unspecified occlusion or stenosis of right carotid arteries: Secondary | ICD-10-CM

## 2018-01-12 DIAGNOSIS — M542 Cervicalgia: Secondary | ICD-10-CM | POA: Diagnosis not present

## 2018-01-12 DIAGNOSIS — G4733 Obstructive sleep apnea (adult) (pediatric): Secondary | ICD-10-CM | POA: Insufficient documentation

## 2018-01-12 DIAGNOSIS — Z96659 Presence of unspecified artificial knee joint: Secondary | ICD-10-CM | POA: Diagnosis present

## 2018-01-12 DIAGNOSIS — I69354 Hemiplegia and hemiparesis following cerebral infarction affecting left non-dominant side: Secondary | ICD-10-CM | POA: Insufficient documentation

## 2018-01-12 DIAGNOSIS — F419 Anxiety disorder, unspecified: Secondary | ICD-10-CM

## 2018-01-12 DIAGNOSIS — I10 Essential (primary) hypertension: Secondary | ICD-10-CM | POA: Diagnosis not present

## 2018-01-12 DIAGNOSIS — F1410 Cocaine abuse, uncomplicated: Secondary | ICD-10-CM | POA: Diagnosis present

## 2018-01-12 DIAGNOSIS — E1149 Type 2 diabetes mellitus with other diabetic neurological complication: Secondary | ICD-10-CM

## 2018-01-12 DIAGNOSIS — E1142 Type 2 diabetes mellitus with diabetic polyneuropathy: Secondary | ICD-10-CM | POA: Diagnosis present

## 2018-01-12 DIAGNOSIS — E669 Obesity, unspecified: Secondary | ICD-10-CM | POA: Diagnosis present

## 2018-01-12 DIAGNOSIS — Z8249 Family history of ischemic heart disease and other diseases of the circulatory system: Secondary | ICD-10-CM

## 2018-01-12 DIAGNOSIS — K5900 Constipation, unspecified: Secondary | ICD-10-CM | POA: Diagnosis present

## 2018-01-12 DIAGNOSIS — Z79899 Other long term (current) drug therapy: Secondary | ICD-10-CM

## 2018-01-12 DIAGNOSIS — E1159 Type 2 diabetes mellitus with other circulatory complications: Secondary | ICD-10-CM | POA: Diagnosis not present

## 2018-01-12 DIAGNOSIS — R531 Weakness: Secondary | ICD-10-CM

## 2018-01-12 DIAGNOSIS — R29706 NIHSS score 6: Secondary | ICD-10-CM | POA: Diagnosis present

## 2018-01-12 DIAGNOSIS — R296 Repeated falls: Secondary | ICD-10-CM | POA: Diagnosis present

## 2018-01-12 DIAGNOSIS — R569 Unspecified convulsions: Secondary | ICD-10-CM | POA: Diagnosis not present

## 2018-01-12 DIAGNOSIS — Z886 Allergy status to analgesic agent status: Secondary | ICD-10-CM

## 2018-01-12 DIAGNOSIS — Z743 Need for continuous supervision: Secondary | ICD-10-CM | POA: Diagnosis not present

## 2018-01-12 DIAGNOSIS — R29715 NIHSS score 15: Secondary | ICD-10-CM | POA: Diagnosis not present

## 2018-01-12 DIAGNOSIS — Z9114 Patient's other noncompliance with medication regimen: Secondary | ICD-10-CM | POA: Diagnosis not present

## 2018-01-12 DIAGNOSIS — Z8701 Personal history of pneumonia (recurrent): Secondary | ICD-10-CM

## 2018-01-12 DIAGNOSIS — R4020 Unspecified coma: Secondary | ICD-10-CM | POA: Diagnosis not present

## 2018-01-12 DIAGNOSIS — F1910 Other psychoactive substance abuse, uncomplicated: Secondary | ICD-10-CM | POA: Diagnosis present

## 2018-01-12 DIAGNOSIS — R29707 NIHSS score 7: Secondary | ICD-10-CM | POA: Diagnosis not present

## 2018-01-12 DIAGNOSIS — R200 Anesthesia of skin: Secondary | ICD-10-CM | POA: Diagnosis not present

## 2018-01-12 DIAGNOSIS — R29711 NIHSS score 11: Secondary | ICD-10-CM | POA: Diagnosis not present

## 2018-01-12 DIAGNOSIS — I63031 Cerebral infarction due to thrombosis of right carotid artery: Secondary | ICD-10-CM | POA: Diagnosis not present

## 2018-01-12 DIAGNOSIS — I6529 Occlusion and stenosis of unspecified carotid artery: Secondary | ICD-10-CM | POA: Diagnosis present

## 2018-01-12 LAB — COMPREHENSIVE METABOLIC PANEL
ALK PHOS: 78 U/L (ref 38–126)
ALT: 13 U/L (ref 0–44)
AST: 14 U/L — ABNORMAL LOW (ref 15–41)
Albumin: 3.6 g/dL (ref 3.5–5.0)
Anion gap: 4 — ABNORMAL LOW (ref 5–15)
BUN: 13 mg/dL (ref 8–23)
CALCIUM: 9.6 mg/dL (ref 8.9–10.3)
CO2: 33 mmol/L — ABNORMAL HIGH (ref 22–32)
Chloride: 107 mmol/L (ref 98–111)
Creatinine, Ser: 1.08 mg/dL (ref 0.61–1.24)
GFR calc Af Amer: >60 mL/min (ref >60)
GFR calc non Af Amer: >60 mL/min (ref >60)
Glucose, Bld: 121 mg/dL — ABNORMAL HIGH (ref 70–99)
Potassium: 3.7 mmol/L (ref 3.5–5.1)
Sodium: 144 mmol/L (ref 135–145)
Total Bilirubin: 0.6 mg/dL (ref 0.3–1.2)
Total Protein: 6.5 g/dL (ref 6.5–8.1)

## 2018-01-12 LAB — DIFFERENTIAL
Abs Immature Granulocytes: 0.03 10*3/uL (ref 0.00–0.07)
Basophils Absolute: 0 10*3/uL (ref 0.0–0.1)
Basophils Relative: 1 %
Eosinophils Absolute: 0.2 10*3/uL (ref 0.0–0.5)
Eosinophils Relative: 4 %
Immature Granulocytes: 1 %
Lymphocytes Relative: 20 %
Lymphs Abs: 1.4 10*3/uL (ref 0.7–4.0)
Monocytes Absolute: 0.6 10*3/uL (ref 0.1–1.0)
Monocytes Relative: 8 %
Neutro Abs: 4.4 10*3/uL (ref 1.7–7.7)
Neutrophils Relative %: 66 %

## 2018-01-12 LAB — URINALYSIS, ROUTINE W REFLEX MICROSCOPIC
BILIRUBIN URINE: NEGATIVE
Glucose, UA: NEGATIVE mg/dL
Hgb urine dipstick: NEGATIVE
Ketones, ur: NEGATIVE mg/dL
Leukocytes, UA: NEGATIVE
NITRITE: NEGATIVE
Protein, ur: NEGATIVE mg/dL
Specific Gravity, Urine: 1.009 (ref 1.005–1.030)
pH: 6 (ref 5.0–8.0)

## 2018-01-12 LAB — CBC
HCT: 45.1 % (ref 39.0–52.0)
Hemoglobin: 13.4 g/dL (ref 13.0–17.0)
MCH: 26.9 pg (ref 26.0–34.0)
MCHC: 29.7 g/dL — ABNORMAL LOW (ref 30.0–36.0)
MCV: 90.6 fL (ref 80.0–100.0)
Platelets: 220 10*3/uL (ref 150–400)
RBC: 4.98 MIL/uL (ref 4.22–5.81)
RDW: 14.1 % (ref 11.5–15.5)
WBC: 6.7 10*3/uL (ref 4.0–10.5)
nRBC: 0 % (ref 0.0–0.2)

## 2018-01-12 LAB — ETHANOL: Alcohol, Ethyl (B): <10 mg/dL (ref <10)

## 2018-01-12 LAB — I-STAT CHEM 8, ED
BUN: 18 mg/dL (ref 8–23)
Calcium, Ion: 1.22 mmol/L (ref 1.15–1.40)
Chloride: 108 mmol/L (ref 98–111)
Creatinine, Ser: 1 mg/dL (ref 0.61–1.24)
GLUCOSE: 115 mg/dL — AB (ref 70–99)
HCT: 45 % (ref 39.0–52.0)
Hemoglobin: 15.3 g/dL (ref 13.0–17.0)
Potassium: 4.1 mmol/L (ref 3.5–5.1)
Sodium: 144 mmol/L (ref 135–145)
TCO2: 32 mmol/L (ref 22–32)

## 2018-01-12 LAB — POCT GLYCOSYLATED HEMOGLOBIN (HGB A1C): HBA1C, POC (CONTROLLED DIABETIC RANGE): 5.9 % (ref 0.0–7.0)

## 2018-01-12 LAB — I-STAT TROPONIN, ED: Troponin i, poc: 0.01 ng/mL (ref 0.00–0.08)

## 2018-01-12 LAB — PROTIME-INR
INR: 1.01
Prothrombin Time: 13.2 seconds (ref 11.4–15.2)

## 2018-01-12 LAB — RAPID URINE DRUG SCREEN, HOSP PERFORMED
Amphetamines: NOT DETECTED
Barbiturates: NOT DETECTED
Benzodiazepines: NOT DETECTED
Cocaine: POSITIVE — AB
Opiates: NOT DETECTED
Tetrahydrocannabinol: NOT DETECTED

## 2018-01-12 LAB — GLUCOSE, POCT (MANUAL RESULT ENTRY): POC Glucose: 131 mg/dl — AB (ref 70–99)

## 2018-01-12 LAB — APTT: aPTT: 29 seconds (ref 24–36)

## 2018-01-12 MED ORDER — LEVETIRACETAM 500 MG PO TABS: 500.0000 mg | ORAL_TABLET | Freq: Two times a day (BID) | ORAL | 3 refills | Status: DC

## 2018-01-12 MED ORDER — ROPINIROLE HCL 0.5 MG PO TABS: 0.5000 mg | ORAL_TABLET | Freq: Every day | ORAL | 3 refills | Status: DC

## 2018-01-12 MED ORDER — CARVEDILOL 12.5 MG PO TABS: 12.5000 mg | ORAL_TABLET | Freq: Two times a day (BID) | ORAL | 3 refills | Status: DC

## 2018-01-12 MED ORDER — FLUTICASONE-SALMETEROL 250-50 MCG/DOSE IN AEPB: 1.0000 | INHALATION_SPRAY | Freq: Two times a day (BID) | RESPIRATORY_TRACT | 3 refills | Status: DC

## 2018-01-12 MED ORDER — PREGABALIN 150 MG PO CAPS: 150.0000 mg | ORAL_CAPSULE | Freq: Two times a day (BID) | ORAL | 0 refills | Status: DC

## 2018-01-12 MED ORDER — TIZANIDINE HCL 4 MG PO TABS: 4.0000 mg | ORAL_TABLET | Freq: Three times a day (TID) | ORAL | 3 refills | Status: DC | PRN

## 2018-01-12 MED ORDER — PANTOPRAZOLE SODIUM 40 MG PO TBEC: 40.0000 mg | DELAYED_RELEASE_TABLET | Freq: Every day | ORAL | 3 refills | Status: DC

## 2018-01-12 MED ORDER — ATORVASTATIN CALCIUM 80 MG PO TABS: 80.0000 mg | ORAL_TABLET | Freq: Every day | ORAL | 3 refills | Status: DC

## 2018-01-12 MED ORDER — MEMANTINE HCL 10 MG PO TABS: 10.0000 mg | ORAL_TABLET | Freq: Two times a day (BID) | ORAL | 3 refills | Status: DC

## 2018-01-12 MED ORDER — DULOXETINE HCL 60 MG PO CPEP: 60.0000 mg | ORAL_CAPSULE | Freq: Every day | ORAL | 3 refills | Status: DC

## 2018-01-12 MED ORDER — CLOPIDOGREL BISULFATE 75 MG PO TABS: 75.0000 mg | ORAL_TABLET | Freq: Every day | ORAL | 3 refills | Status: DC

## 2018-01-12 MED ORDER — ALLOPURINOL 300 MG PO TABS: 300.0000 mg | ORAL_TABLET | Freq: Every day | ORAL | 3 refills | Status: DC

## 2018-01-12 NOTE — ED Triage Notes (Signed)
Pt from home for fall, pt has hx of stroke with left sided deficits but pt reports these have gotten worse over the past week. Pt unable to raise left arm. Pt denies any pain at this time. Pt a.o, nad noted. VSS

## 2018-01-12 NOTE — Progress Notes (Signed)
Patient is having mini strokes per caregiver [name].

## 2018-01-12 NOTE — ED Provider Notes (Addendum)
[facility] EMERGENCY DEPARTMENT Provider Note   CSN: [id number] Arrival date & time: 01/12/18  1941     History   Chief Complaint Chief Complaint  Patient presents with  . Weakness  . Fall    HPI [name] is a 67 y.o. male hx of alcohol abuse, CAD, CHF, COPD, DM, HL, previous stroke here with fall, left-sided weakness.  Patient had previous left-sided weakness after a stroke.  Patient was actually admitted for stroke work-up 2 months ago and admitted for encephalopathy a month ago.  His recent MRI was on 10/21 and showed no stroke.  Patient states that for the last 2 to 3 days, he has been having some left facial numbness.  Some heaviness in the left arm as well.  He states that he has chronic weakness in the left arm and sometimes he gets worse.  He also has occasional trouble walking as well.  He apparently fell earlier today due to weakness.   The history is provided by the patient.    Past Medical History:  Diagnosis Date  . Alcohol abuse    H/O  . Anxiety   . Arthritis   . Asthma   . CAD (coronary artery disease) 05/27/10   Cath: severe single vessell CAD left cx midportion obtuse marginal 2 to 3.  [name] CHF (congestive heart failure) ([place])   . COPD (chronic obstructive pulmonary disease) ([place])   . COPD (chronic obstructive pulmonary disease) ([place])   . Depression   . Diabetes mellitus, type 2 ([place]) 03/13/2015  . GERD (gastroesophageal reflux disease)   . HCAP (healthcare-associated pneumonia) 10/15/2015  . History of DVT of lower extremity   . Hyperlipemia   . Hyperlipidemia   . Hypertension   . Myocardial infarction ([place]) 2000  . Orbital fracture 12/2012  . OSA on CPAP   . Peripheral vascular disease, unspecified ([place])    08/20/10 doppler: increase in right ABI post-op. Left ABI stable. S/P bi-fem bypass surgery  . Pneumonia 04/04/2012  . Shortness of breath   . Stroke ([place])   . Tobacco abuse     Patient Active Problem List   Diagnosis  Date Noted  . AMS (altered mental status) 11/29/2017  . Acute encephalopathy 11/28/2017  . Left shoulder pain 09/28/2017  . Cerebral thrombosis with cerebral infarction 07/18/2016  . Acute ischemic stroke ([place])   . Left hemiplegia ([place])   . Stroke-like symptoms 07/17/2016  . Insomnia 01/29/2016  . Depression 01/29/2016  . Alcohol abuse with intoxication ([place]) 10/16/2015  . Psoriasiform dermatitis 07/12/2015  . Dementia due to another medical condition ([place]) 07/12/2015  . Urinary incontinence 03/14/2015  . Diabetes mellitus, type 2 ([place]) 03/13/2015  . Hemiparesis affecting left side as late effect of cerebrovascular accident ([place]) 02/18/2015  . Cerebrovascular accident (CVA) due to stenosis of right carotid artery ([place])   . Coronary artery disease involving native coronary artery of native heart without angina pectoris   . Tachypnea   . Thrombocytopenia ([place])   . History of stroke   . Stroke (cerebrum) ([place]) 02/06/2015  . Arterial ischemic stroke, MCA (middle cerebral artery), right, acute ([place])   . DOE (dyspnea on exertion) 11/23/2014  . PAF (paroxysmal atrial fibrillation) ([place]) 11/23/2014  . HLD (hyperlipidemia) 11/01/2014  . Tobacco use disorder 11/01/2014  . Diastolic CHF, acute on chronic (HCC) 11/01/2014  . Cerebral infarction due to stenosis of right carotid artery ([place]) 11/01/2014  . Carotid stenosis 11/01/2014  . Hyperkalemia 10/24/2014  .  H/O total knee replacement 10/24/2014  . Essential hypertension   . Polysubstance abuse ([place]) 08/28/2014  . Cocaine abuse ([place])   . Intracranial carotid stenosis   . Headache around the eyes 08/27/2014  . Acute CVA (cerebrovascular accident) ([place]) 08/27/2014  . CVA (cerebral vascular accident) ([place]) 08/26/2014  . History of DVT (deep vein thrombosis)   . Alcoholism ([place])   . Embolic stroke ([place])   . Cerebral infarction due to unspecified mechanism   . Hypotension   . AKI (acute kidney injury) ([place])   . Stroke ([place])   . CVA  (cerebral infarction) 08/01/2014  . Alcohol dependence with withdrawal with complication ([place]) 35/32/9924  . DVT (deep venous thrombosis) ([place]) 02/22/2014  . Lower extremity edema 02/21/2014  . Chronic diastolic congestive heart failure ([place]) 11/21/2013  . CAD (coronary artery disease) 11/21/2013  . Alcohol abuse 11/21/2013  . GERD (gastroesophageal reflux disease) 08/10/2013  . Gout 08/10/2013  . Tobacco abuse 07/26/2013  . Trichiasis without entropion 04/16/2013  . Dizziness 04/10/2013  . Eyelid lesion 01/24/2013  . Enophthalmos due to trauma 01/19/2013  . Medial orbital wall fracture 01/19/2013  . Closed blow-out fracture of floor of orbit ([place]) 01/19/2013  . Binocular vision disorder with diplopia 01/03/2013  . Fracture of inferior orbital wall ([place]) 01/03/2013  . Fracture of orbital floor ([place]) 01/03/2013  . Pain of left eye 01/03/2013  . Peripheral vascular disease ([place]) 07/27/2012  . COPD (chronic obstructive pulmonary disease) ([place]) 04/04/2012  . HTN (hypertension) 04/04/2012    Past Surgical History:  Procedure Laterality Date  . abdoninal ao angio & bifem angio  05/27/10   Patent graft, occluded bil stents with no retrograde flow into the hypogastric arteries. 100% occl left ant. tibial artery, 70% to 80% to 100% stenosis right superficial fem artery above adductor canal. 100 % occl right ant. tibial vessell  . Aortogram w/ PTCA  09/292003  . BACK SURGERY    . CARDIAC CATHETERIZATION  05/27/10   severe CAD left cx  . CHOLECYSTECTOMY    . ESOPHAGOGASTRIC FUNDOPLICATION    . EYE SURGERY    . FEMORAL BYPASS  08/19/10   Right Fem-Pop  . IR ANGIO INTRA EXTRACRAN SEL COM CAROTID INNOMINATE BILAT MOD SED  07/20/2016  . IR ANGIO VERTEBRAL SEL SUBCLAVIAN INNOMINATE UNI R MOD SED  07/20/2016  . IR ANGIO VERTEBRAL SEL VERTEBRAL UNI L MOD SED  07/20/2016  . IR RADIOLOGIST EVAL & MGMT  05/27/2016  . JOINT REPLACEMENT    . PERIPHERAL VASCULAR CATHETERIZATION N/A 12/10/2014    Procedure: Abdominal Aortogram;  Surgeon: Angelia Mould, MD;  Location: [facility];  Service: Cardiovascular;  Laterality: N/A;  . RADIOLOGY WITH ANESTHESIA N/A 02/08/2015   Procedure: RADIOLOGY WITH ANESTHESIA;  Surgeon: Luanne Bras, MD;  Location: [facility];  Service: Radiology;  Laterality: N/A;  . TEE WITHOUT CARDIOVERSION N/A 08/29/2014   Procedure: TRANSESOPHAGEAL ECHOCARDIOGRAM (TEE);  Surgeon: Josue Hector, MD;  Location: [facility] ENDOSCOPY;  Service: Cardiovascular;  Laterality: N/A;  . TOTAL KNEE ARTHROPLASTY          Home Medications    Prior to Admission medications   Medication Sig Start Date End Date Taking? Authorizing Provider  ACCU-CHEK SOFTCLIX LANCETS lancets Use 3 times daily before meals 09/17/15   Newlin, Enobong, MD  albuterol (PROAIR HFA) 108 (90 Base) MCG/ACT inhaler INHALE 2 PUFFS EVERY 6 HOURS AS NEEDED FOR WHEEZING OR SHORTNESS OF BREATH Patient taking differently: Inhale 2 puffs into the lungs every  6 (six) hours as needed for wheezing or shortness of breath.  10/05/17   Charlott Rakes, MD  allopurinol (ZYLOPRIM) 300 MG tablet Take 1 tablet (300 mg total) by mouth daily. 01/12/18   Charlott Rakes, MD  aspirin 325 MG tablet Take 1 tablet (325 mg total) by mouth daily. Patient taking differently: Take 81 mg by mouth daily.  10/30/17   Patrecia Pour, MD  atorvastatin (LIPITOR) 80 MG tablet Take 1 tablet (80 mg total) by mouth daily at 6 PM. 01/12/18   Charlott Rakes, MD  Blood Glucose Monitoring Suppl (ACCU-CHEK AVIVA) device Use 3 times daily before meals. 03/13/15   Charlott Rakes, MD  carvedilol (COREG) 12.5 MG tablet Take 1 tablet (12.5 mg total) by mouth 2 (two) times daily with a meal. 01/12/18   Charlott Rakes, MD  clopidogrel (PLAVIX) 75 MG tablet Take 1 tablet (75 mg total) by mouth daily. 01/12/18   Charlott Rakes, MD  DULoxetine (CYMBALTA) 60 MG capsule Take 1 capsule (60 mg total) by mouth daily. 01/12/18   Charlott Rakes, MD    Fluticasone-Salmeterol (ADVAIR DISKUS) 250-50 MCG/DOSE AEPB Inhale 1 puff into the lungs 2 (two) times daily. 01/12/18   Charlott Rakes, MD  furosemide (LASIX) 20 MG tablet Take 2 tablets (40 mg total) by mouth 2 (two) times daily. Patient taking differently: Take 20 mg by mouth 2 (two) times daily.  10/08/17   Charlott Rakes, MD  glucose blood (ACCU-CHEK AVIVA) test strip Use 3 times daily before meals as directed. 01/29/16   Charlott Rakes, MD  hydrocortisone cream 1 % Apply 1 application topically 2 (two) times daily.    [provider]  hydrOXYzine (ATARAX/VISTARIL) 25 MG tablet Take 25 mg by mouth every 6 (six) hours as needed.    [provider]  lactulose (CHRONULAC) 10 GM/15ML solution Take 15 mLs (10 g total) by mouth 2 (two) times daily as needed for mild constipation. 08/19/17   Charlott Rakes, MD  Lancet Devices [facility]) lancets Use 3 times daily before meals 09/27/15   Charlott Rakes, MD  levETIRAcetam (KEPPRA) 500 MG tablet Take 1 tablet (500 mg total) by mouth 2 (two) times daily. 01/12/18   Charlott Rakes, MD  memantine (NAMENDA) 10 MG tablet Take 1 tablet (10 mg total) by mouth 2 (two) times daily. 01/12/18   Charlott Rakes, MD  Misc. Devices (ROLLATOR) MISC 1 each by Does not apply route daily. 08/19/17   Charlott Rakes, MD  naphazoline-pheniramine (NAPHCON-A) 0.025-0.3 % ophthalmic solution Place 1 drop into the left eye 4 (four) times daily as needed for eye irritation. 10/29/17   Patrecia Pour, MD  pantoprazole (PROTONIX) 40 MG tablet Take 1 tablet (40 mg total) by mouth daily. 01/12/18   Charlott Rakes, MD  pregabalin (LYRICA) 150 MG capsule Take 1 capsule (150 mg total) by mouth 2 (two) times daily. 01/12/18   Charlott Rakes, MD  rOPINIRole (REQUIP) 0.5 MG tablet Take 1 tablet (0.5 mg total) by mouth at bedtime. 01/12/18   Charlott Rakes, MD  tiZANidine (ZANAFLEX) 4 MG tablet Take 1 tablet (4 mg total) by mouth every 8 (eight) hours as needed for  muscle spasms. 01/12/18   Charlott Rakes, MD  traZODone (DESYREL) 150 MG tablet Take 1 tablet (150 mg total) by mouth at bedtime as needed for sleep. Patient not taking: Reported on 11/28/2017 08/19/17   Charlott Rakes, MD    Family History Family History  Problem Relation Age of Onset  . Heart attack Mother   .  Heart failure Mother   . Cirrhosis Father   . Heart failure Brother   . Cancer Brother   . Hypertension Neg Hx        UNKNOWN  . Stroke Neg Hx        UNKNOWN    Social History Social History   Tobacco Use  . Smoking status: Current Every Day Smoker    Packs/day: 0.50    Years: 50.00    Pack years: 25.00    Types: Cigars, Cigarettes    Start date: 02/09/1961  . Smokeless tobacco: Former Systems developer    Types: Chew  . Tobacco comment: 2 ppd, full flavor  Substance Use Topics  . Alcohol use: Yes  . Drug use: Yes    Types: Cocaine     Allergies   Darvocet [propoxyphene n-acetaminophen]; Haldol [haloperidol decanoate]; Acetaminophen; Metformin and related; and Norco [hydrocodone-acetaminophen]   Review of Systems Review of Systems  Neurological: Positive for weakness and numbness.  All other systems reviewed and are negative.    Physical Exam Updated Vital Signs BP (!) 145/79   Pulse 67   Temp (!) 97.4 F (36.3 C) (Oral)   Resp (!) 21   SpO2 94%   Physical Exam  Constitutional:  Chronically ill   HENT:  Head: Normocephalic.  Mouth/Throat: Oropharynx is clear and moist.  Eyes: Pupils are equal, round, and reactive to light. Conjunctivae and EOM are normal.  Neck: Normal range of motion. Neck supple.  Cardiovascular: Normal rate, regular rhythm and normal heart sounds.  Pulmonary/Chest: Effort normal and breath sounds normal. No stridor. No respiratory distress.  Abdominal: Soft. Bowel sounds are normal. He exhibits no distension. There is no tenderness.  Musculoskeletal: Normal range of motion.  Neurological:  Slightly dec sensation L face. Strength 4/5  L arm and leg (chronic L sided weakness). Cranial nerves otherwise intact   Skin: Skin is warm. Capillary refill takes less than 2 seconds.  Psychiatric: He has a normal mood and affect.  Nursing note and vitals reviewed.    ED Treatments / Results  Labs (all labs ordered are listed, but only abnormal results are displayed) Labs Reviewed  CBC - Abnormal; Notable for the following components:      Result Value   MCHC 29.7 (*)    All other components within normal limits  COMPREHENSIVE METABOLIC PANEL - Abnormal; Notable for the following components:   CO2 33 (*)    Glucose, Bld 121 (*)    AST 14 (*)    Anion gap 4 (*)    All other components within normal limits  RAPID URINE DRUG SCREEN, HOSP PERFORMED - Abnormal; Notable for the following components:   Cocaine POSITIVE (*)    All other components within normal limits  I-STAT CHEM 8, ED - Abnormal; Notable for the following components:   Glucose, Bld 115 (*)    All other components within normal limits  ETHANOL  PROTIME-INR  APTT  DIFFERENTIAL  URINALYSIS, ROUTINE W REFLEX MICROSCOPIC  I-STAT TROPONIN, ED    EKG EKG Interpretation  Date/Time:  Wednesday January 12 2018 19:51:03 EST Ventricular Rate:  68 PR Interval:    QRS Duration: 96 QT Interval:  424 QTC Calculation: 451 R Axis:   44 Text Interpretation:  Sinus rhythm Anterior infarct, old No significant change since last tracing Confirmed by Wandra Arthurs [phone]) on 01/12/2018 8:33:45 PM   Radiology Dg Chest 2 View  Result Date: 01/12/2018 CLINICAL DATA:  67 year old male with weakness. EXAM: CHEST -  2 VIEW COMPARISON:  Chest radiograph dated 11/28/2017 FINDINGS: There is no new consolidation, pleural effusion, or pneumothorax. Bibasilar atelectasis noted. There is pleural thickening or a small loculated collection along the right lateral pleural surface, similar to prior radiograph. A 9 mm right upper lobe nodule again noted as seen previously. There is  cardiomegaly. No acute osseous pathology. IMPRESSION: 1. No acute cardiopulmonary process. 2. Cardiomegaly. 3. Small loculated pleural effusion versus pleural thickening along the right lateral pleural surface. 4. A 9 mm right upper lobe nodule as seen previously. Chest CT may provide better evaluation on a nonemergent basis. Electronically Signed   By: Anner Crete M.D.   On: 01/12/2018 21:13   Ct Head Wo Contrast  Result Date: 01/12/2018 CLINICAL DATA:  Altered level of consciousness EXAM: CT HEAD WITHOUT CONTRAST TECHNIQUE: Contiguous axial images were obtained from the base of the skull through the vertex without intravenous contrast. COMPARISON:  MRI 11/29/2017 FINDINGS: Brain: Old right frontoparietal infarct, stable. There is atrophy and chronic small vessel disease changes. No acute intracranial abnormality. Specifically, no hemorrhage, hydrocephalus, mass lesion, acute infarction, or significant intracranial injury. Vascular: No hyperdense vessel or unexpected calcification. Skull: No acute calvarial abnormality. Sinuses/Orbits: No acute finding. Postoperative changes on the medial left orbital wall and orbital floor with mesh, stable. Other: None IMPRESSION: Old right frontal/parietal infarcts. No acute intracranial abnormality. Atrophy, chronic microvascular disease. Electronically Signed   By: Rolm Baptise M.D.   On: 01/12/2018 20:39    Procedures Procedures (including critical care time)  Medications Ordered in ED Medications - No data to display   Initial Impression / Assessment and Plan / ED Course  I have reviewed the triage vital signs and the nursing notes.  Pertinent labs & imaging results that were available during my care of the patient were reviewed by me and considered in my medical decision making (see chart for details).    [name] is a 67 y.o. male here with weakness, L facial numbness. Had chronic L arm weakness, just admitted for stroke several months  ago. Consider another stroke vs TIA vs infection. Will get labs, CT head, MRI brain, UA, CXR.   12:26 AM CT head and labs unremarkable. MRI brain pending. Patient does have cocaine in his UDS. Signed out to Dr. Roxanne Mins in the ED to follow up MRI brain. If MRI showed a stroke, then patient will need admission. If unremarkable and patient ambulates then patient can be discharged. Otherwise, patient will need to be admitted for workup for weakness. Signed out to Dr. Roxanne Mins   Final Clinical Impressions(s) / ED Diagnoses   Final diagnoses:  None    ED Discharge Orders    None       Drenda Freeze, MD 01/13/18 [id number]    Drenda Freeze, MD 01/13/18 [phone]

## 2018-01-12 NOTE — Telephone Encounter (Signed)
Met with the patient and his caregiver, [name], when he was in the clinic today. He requested that the home health referral be made to [facility] health when the order is received.

## 2018-01-12 NOTE — Progress Notes (Signed)
Subjective:  Patient ID: [name], male    DOB: 1951-01-30  Age: 67 y.o. MRN: [id number]  CC: Hospitalization Follow-up   HPI [name]  is a 67 year old male with a history of Diabetes mellitus (diet-controlled A1c of 5.9) HTN, CHF (EF 55-60% from 2-D echo of 10/2017), COPD, multiple previous CVAs with residual left hemiparesis, hyperlipidemia, gout, dementia,PVD, carotid stenosis (status post placement of ICA stent), tobacco abuse here for a follow up visit. He had a hospitalization in 11/2017 for acute encephalopathy and new onset seizures after which he was discharged to inpatient rehab. During his hospitalization a chest xray had revealed a 1.2cm right lung nodule. He is unhappy the cord to his power wheelchair got lost at the rehab facility.  He is accompanied by his caretaker who states the patient has been having multiple TIAs evidenced by slight drooping of the mouth with resolution of symptoms. He has been compliant with his medications and has not been to see Neurology since discharge . He continues to smoke and has no plans of quitting. He denies hypoglycemia and his neuropathy is controlled on Gabapentin. The pedal edema he previously had has resolved. He would like to be referred to [facility] He has had no seizures since discharge.  Past Medical History:  Diagnosis Date  . Alcohol abuse    H/O  . Anxiety   . Arthritis   . Asthma   . CAD (coronary artery disease) 05/27/10   Cath: severe single vessell CAD left cx midportion obtuse marginal 2 to 3.  [name] CHF (congestive heart failure) ([place])   . COPD (chronic obstructive pulmonary disease) ([place])   . COPD (chronic obstructive pulmonary disease) ([place])   . Depression   . Diabetes mellitus, type 2 ([place]) 03/13/2015  . GERD (gastroesophageal reflux disease)   . HCAP (healthcare-associated pneumonia) 10/15/2015  . History of DVT of lower extremity   . Hyperlipemia   . Hyperlipidemia   . Hypertension   .  Myocardial infarction ([place]) 2000  . Orbital fracture 12/2012  . OSA on CPAP   . Peripheral vascular disease, unspecified ([place])    08/20/10 doppler: increase in right ABI post-op. Left ABI stable. S/P bi-fem bypass surgery  . Pneumonia 04/04/2012  . Shortness of breath   . Stroke ([place])   . Tobacco abuse     Past Surgical History:  Procedure Laterality Date  . abdoninal ao angio & bifem angio  05/27/10   Patent graft, occluded bil stents with no retrograde flow into the hypogastric arteries. 100% occl left ant. tibial artery, 70% to 80% to 100% stenosis right superficial fem artery above adductor canal. 100 % occl right ant. tibial vessell  . Aortogram w/ PTCA  09/292003  . BACK SURGERY    . CARDIAC CATHETERIZATION  05/27/10   severe CAD left cx  . CHOLECYSTECTOMY    . ESOPHAGOGASTRIC FUNDOPLICATION    . EYE SURGERY    . FEMORAL BYPASS  08/19/10   Right Fem-Pop  . IR ANGIO INTRA EXTRACRAN SEL COM CAROTID INNOMINATE BILAT MOD SED  07/20/2016  . IR ANGIO VERTEBRAL SEL SUBCLAVIAN INNOMINATE UNI R MOD SED  07/20/2016  . IR ANGIO VERTEBRAL SEL VERTEBRAL UNI L MOD SED  07/20/2016  . IR RADIOLOGIST EVAL & MGMT  05/27/2016  . JOINT REPLACEMENT    . PERIPHERAL VASCULAR CATHETERIZATION N/A 12/10/2014   Procedure: Abdominal Aortogram;  Surgeon: Angelia Mould, MD;  Location: [facility];  Service: Cardiovascular;  Laterality: N/A;  . RADIOLOGY WITH ANESTHESIA N/A 02/08/2015   Procedure: RADIOLOGY WITH ANESTHESIA;  Surgeon: Luanne Bras, MD;  Location: [facility];  Service: Radiology;  Laterality: N/A;  . TEE WITHOUT CARDIOVERSION N/A 08/29/2014   Procedure: TRANSESOPHAGEAL ECHOCARDIOGRAM (TEE);  Surgeon: Josue Hector, MD;  Location: [facility] ENDOSCOPY;  Service: Cardiovascular;  Laterality: N/A;  . TOTAL KNEE ARTHROPLASTY      Allergies  Allergen Reactions  . Darvocet [Propoxyphene N-Acetaminophen] Hives  . Haldol [Haloperidol Decanoate] Hives  . Acetaminophen Nausea Only    Upset  stomach, tolerates Hydrocodone/APAP if taken with food  . Metformin And Related Nausea And Vomiting  . Norco [Hydrocodone-Acetaminophen] Nausea And Vomiting    Tolerates if taken with food     Outpatient Medications Prior to Visit  Medication Sig Dispense Refill  . ACCU-CHEK SOFTCLIX LANCETS lancets Use 3 times daily before meals 100 each 5  . albuterol (PROAIR HFA) 108 (90 Base) MCG/ACT inhaler INHALE 2 PUFFS EVERY 6 HOURS AS NEEDED FOR WHEEZING OR SHORTNESS OF BREATH (Patient taking differently: Inhale 2 puffs into the lungs every 6 (six) hours as needed for wheezing or shortness of breath. ) 8.5 g 2  . aspirin 325 MG tablet Take 1 tablet (325 mg total) by mouth daily. (Patient taking differently: Take 81 mg by mouth daily. )    . Blood Glucose Monitoring Suppl (ACCU-CHEK AVIVA) device Use 3 times daily before meals. 1 each 0  . furosemide (LASIX) 20 MG tablet Take 2 tablets (40 mg total) by mouth 2 (two) times daily. (Patient taking differently: Take 20 mg by mouth 2 (two) times daily. ) 360 tablet 0  . glucose blood (ACCU-CHEK AVIVA) test strip Use 3 times daily before meals as directed. 100 each 12  . hydrocortisone cream 1 % Apply 1 application topically 2 (two) times daily.    . hydrOXYzine (ATARAX/VISTARIL) 25 MG tablet Take 25 mg by mouth every 6 (six) hours as needed.    . lactulose (CHRONULAC) 10 GM/15ML solution Take 15 mLs (10 g total) by mouth 2 (two) times daily as needed for mild constipation. 236 mL 1  . Lancet Devices (ACCU-CHEK SOFTCLIX) lancets Use 3 times daily before meals 1 each 5  . Misc. Devices (ROLLATOR) MISC 1 each by Does not apply route daily. 1 each 0  . naphazoline-pheniramine (NAPHCON-A) 0.025-0.3 % ophthalmic solution Place 1 drop into the left eye 4 (four) times daily as needed for eye irritation.  0  . allopurinol (ZYLOPRIM) 300 MG tablet Take 1 tablet (300 mg total) by mouth daily. 30 tablet 3  . atorvastatin (LIPITOR) 80 MG tablet Take 1 tablet (80 mg  total) by mouth daily at 6 PM. 30 tablet 3  . carvedilol (COREG) 12.5 MG tablet Take 1 tablet (12.5 mg total) by mouth 2 (two) times daily with a meal. 60 tablet 3  . clopidogrel (PLAVIX) 75 MG tablet Take 1 tablet (75 mg total) by mouth daily. 30 tablet 3  . DULoxetine (CYMBALTA) 60 MG capsule Take 1 capsule (60 mg total) by mouth daily. 30 capsule 3  . Fluticasone-Salmeterol (ADVAIR DISKUS) 250-50 MCG/DOSE AEPB Inhale 1 puff into the lungs 2 (two) times daily. 1 each 3  . levETIRAcetam (KEPPRA) 500 MG tablet Take 1 tablet (500 mg total) by mouth 2 (two) times daily.    . memantine (NAMENDA) 10 MG tablet Take 1 tablet (10 mg total) by mouth 2 (two) times daily. 60 tablet 3  . pantoprazole (PROTONIX) 40 MG tablet  TAKE 1 TABLET BY MOUTH EVERY MORNING (Patient taking differently: Take 40 mg by mouth daily. ) 30 tablet 2  . pregabalin (LYRICA) 150 MG capsule Take 1 capsule (150 mg total) by mouth 2 (two) times daily. 180 capsule 0  . rOPINIRole (REQUIP) 0.5 MG tablet Take 1 tablet (0.5 mg total) by mouth at bedtime. 30 tablet 3  . tiZANidine (ZANAFLEX) 4 MG tablet Take 1 tablet (4 mg total) by mouth every 8 (eight) hours as needed for muscle spasms. 90 tablet 3  . traZODone (DESYREL) 150 MG tablet Take 1 tablet (150 mg total) by mouth at bedtime as needed for sleep. (Patient not taking: Reported on 11/28/2017) 30 tablet 3   No facility-administered medications prior to visit.     ROS Review of Systems  Constitutional: Negative for activity change and appetite change.  HENT: Negative for sinus pressure and sore throat.   Eyes: Negative for visual disturbance.  Respiratory: Negative for cough, chest tightness and shortness of breath.   Cardiovascular: Negative for chest pain and leg swelling.  Gastrointestinal: Negative for abdominal distention, abdominal pain, constipation and diarrhea.  Endocrine: Negative.   Genitourinary: Negative for dysuria.  Musculoskeletal: Negative for joint swelling  and myalgias.  Skin: Negative for rash.  Allergic/Immunologic: Negative.   Neurological: Positive for weakness. Negative for light-headedness and numbness.  Psychiatric/Behavioral: Negative for dysphoric mood and suicidal ideas.    Objective:  BP (!) 143/81   Pulse 88   Temp (!) 97.5 F (36.4 C) (Oral)   Ht 6' (1.829 m)   Wt 218 lb (98.9 kg)   SpO2 94%   BMI 29.57 kg/m   BP/Weight 01/12/2018 12/02/2017 96/78/9381  Systolic BP 017 510 -  Diastolic BP 81 70 -  Wt. (Lbs) 218 - 232.59  BMI 29.57 - 31.54      Physical Exam  Constitutional: He is oriented to person, place, and time. He appears well-developed and well-nourished.  Cardiovascular: Normal rate, normal heart sounds and intact distal pulses.  No murmur heard. Pulmonary/Chest: Effort normal and breath sounds normal. He has no wheezes. He has no rales. He exhibits no tenderness.  Abdominal: Soft. Bowel sounds are normal. He exhibits no distension and no mass. There is no tenderness.  Musculoskeletal:  L hemiparesis  Neurological: He is alert and oriented to person, place, and time.  Skin: Skin is warm and dry.  Psychiatric: He has a normal mood and affect.    Lab Results  Component Value Date   HGBA1C 5.4 01/13/2018    Assessment & Plan:   1. Type 2 diabetes mellitus with other neurologic complication, without long-term current use of insulin (HCC) Diet controlled with A1c of 5.4 - POCT glucose (manual entry) - POCT glycosylated hemoglobin (Hb A1C) - pregabalin (LYRICA) 150 MG capsule; Take 1 capsule (150 mg total) by mouth 2 (two) times daily.  Dispense: 180 capsule; Refill: 0  2. Muscle spasm - tiZANidine (ZANAFLEX) 4 MG tablet; Take 1 tablet (4 mg total) by mouth every 8 (eight) hours as needed for muscle spasms.  Dispense: 90 tablet; Refill: 3  3. Right lower lobe lung mass CT lung ordered as he is high risk due to ongoing tobacco abuse  4. Solitary pulmonary nodule - CT CHEST LUNG CA SCREEN LOW DOSE  W/O CM; Future  5. Cerebrovascular accident (CVA) due to stenosis of right carotid artery ([place]) With residual Lhemiparesis High risk patient with ongoing tobacco abuse,not ready to quit - Ambulatory referral to [place]  6.  Idiopathic gout, unspecified chronicity, unspecified site No acute flares - Ambulatory referral to Neurology - allopurinol (ZYLOPRIM) 300 MG tablet; Take 1 tablet (300 mg total) by mouth daily.  Dispense: 30 tablet; Refill: 3  7. Pure hypercholesterolemia Stable - atorvastatin (LIPITOR) 80 MG tablet; Take 1 tablet (80 mg total) by mouth daily at 6 PM.  Dispense: 30 tablet; Refill: 3  8. Essential hypertension Controlled - carvedilol (COREG) 12.5 MG tablet; Take 1 tablet (12.5 mg total) by mouth 2 (two) times daily with a meal.  Dispense: 60 tablet; Refill: 3  9. Hemiparesis affecting left side as late effect of cerebrovascular accident ([place]) - clopidogrel (PLAVIX) 75 MG tablet; Take 1 tablet (75 mg total) by mouth daily.  Dispense: 30 tablet; Refill: 3  10. Chronic obstructive pulmonary disease, unspecified COPD type (HCC) No acute flare - Fluticasone-Salmeterol (ADVAIR DISKUS) 250-50 MCG/DOSE AEPB; Inhale 1 puff into the lungs 2 (two) times daily.  Dispense: 1 each; Refill: 3  11. Dementia due to another medical condition, without behavioral disturbance (HCC) - memantine (NAMENDA) 10 MG tablet; Take 1 tablet (10 mg total) by mouth 2 (two) times daily.  Dispense: 60 tablet; Refill: 3   Meds ordered this encounter  Medications  . allopurinol (ZYLOPRIM) 300 MG tablet    Sig: Take 1 tablet (300 mg total) by mouth daily.    Dispense:  30 tablet    Refill:  3  . atorvastatin (LIPITOR) 80 MG tablet    Sig: Take 1 tablet (80 mg total) by mouth daily at 6 PM.    Dispense:  30 tablet    Refill:  3  . carvedilol (COREG) 12.5 MG tablet    Sig: Take 1 tablet (12.5 mg total) by mouth 2 (two) times daily with a meal.    Dispense:  60 tablet    Refill:  3  .  clopidogrel (PLAVIX) 75 MG tablet    Sig: Take 1 tablet (75 mg total) by mouth daily.    Dispense:  30 tablet    Refill:  3  . DULoxetine (CYMBALTA) 60 MG capsule    Sig: Take 1 capsule (60 mg total) by mouth daily.    Dispense:  30 capsule    Refill:  3  . Fluticasone-Salmeterol (ADVAIR DISKUS) 250-50 MCG/DOSE AEPB    Sig: Inhale 1 puff into the lungs 2 (two) times daily.    Dispense:  1 each    Refill:  3  . memantine (NAMENDA) 10 MG tablet    Sig: Take 1 tablet (10 mg total) by mouth 2 (two) times daily.    Dispense:  60 tablet    Refill:  3  . levETIRAcetam (KEPPRA) 500 MG tablet    Sig: Take 1 tablet (500 mg total) by mouth 2 (two) times daily.    Dispense:  60 tablet    Refill:  3  . pantoprazole (PROTONIX) 40 MG tablet    Sig: Take 1 tablet (40 mg total) by mouth daily.    Dispense:  30 tablet    Refill:  3  . pregabalin (LYRICA) 150 MG capsule    Sig: Take 1 capsule (150 mg total) by mouth 2 (two) times daily.    Dispense:  180 capsule    Refill:  0  . rOPINIRole (REQUIP) 0.5 MG tablet    Sig: Take 1 tablet (0.5 mg total) by mouth at bedtime.    Dispense:  30 tablet    Refill:  3  . tiZANidine (ZANAFLEX) 4  MG tablet    Sig: Take 1 tablet (4 mg total) by mouth every 8 (eight) hours as needed for muscle spasms.    Dispense:  90 tablet    Refill:  3    Follow-up: Return in about 3 months (around 04/13/2018) for Follow-up of chronic medical conditions.   Charlott Rakes MD

## 2018-01-13 ENCOUNTER — Encounter (HOSPITAL_COMMUNITY): Payer: Self-pay | Admitting: Physician Assistant

## 2018-01-13 ENCOUNTER — Encounter: Payer: Self-pay | Admitting: Family Medicine

## 2018-01-13 ENCOUNTER — Inpatient Hospital Stay (HOSPITAL_COMMUNITY): Payer: Medicare Other

## 2018-01-13 ENCOUNTER — Emergency Department (HOSPITAL_COMMUNITY): Payer: Medicare Other

## 2018-01-13 DIAGNOSIS — I63031 Cerebral infarction due to thrombosis of right carotid artery: Secondary | ICD-10-CM | POA: Diagnosis not present

## 2018-01-13 DIAGNOSIS — R29706 NIHSS score 6: Secondary | ICD-10-CM | POA: Diagnosis present

## 2018-01-13 DIAGNOSIS — I69354 Hemiplegia and hemiparesis following cerebral infarction affecting left non-dominant side: Secondary | ICD-10-CM | POA: Diagnosis not present

## 2018-01-13 DIAGNOSIS — G8114 Spastic hemiplegia affecting left nondominant side: Secondary | ICD-10-CM | POA: Diagnosis not present

## 2018-01-13 DIAGNOSIS — R471 Dysarthria and anarthria: Secondary | ICD-10-CM | POA: Diagnosis present

## 2018-01-13 DIAGNOSIS — I5032 Chronic diastolic (congestive) heart failure: Secondary | ICD-10-CM | POA: Diagnosis not present

## 2018-01-13 DIAGNOSIS — F0280 Dementia in other diseases classified elsewhere without behavioral disturbance: Secondary | ICD-10-CM | POA: Diagnosis not present

## 2018-01-13 DIAGNOSIS — E1159 Type 2 diabetes mellitus with other circulatory complications: Secondary | ICD-10-CM | POA: Diagnosis not present

## 2018-01-13 DIAGNOSIS — F1410 Cocaine abuse, uncomplicated: Secondary | ICD-10-CM | POA: Diagnosis present

## 2018-01-13 DIAGNOSIS — E1142 Type 2 diabetes mellitus with diabetic polyneuropathy: Secondary | ICD-10-CM | POA: Diagnosis not present

## 2018-01-13 DIAGNOSIS — F17290 Nicotine dependence, other tobacco product, uncomplicated: Secondary | ICD-10-CM | POA: Diagnosis present

## 2018-01-13 DIAGNOSIS — F1020 Alcohol dependence, uncomplicated: Secondary | ICD-10-CM | POA: Diagnosis present

## 2018-01-13 DIAGNOSIS — J449 Chronic obstructive pulmonary disease, unspecified: Secondary | ICD-10-CM | POA: Diagnosis not present

## 2018-01-13 DIAGNOSIS — G8194 Hemiplegia, unspecified affecting left nondominant side: Secondary | ICD-10-CM | POA: Diagnosis present

## 2018-01-13 DIAGNOSIS — I6521 Occlusion and stenosis of right carotid artery: Secondary | ICD-10-CM

## 2018-01-13 DIAGNOSIS — I10 Essential (primary) hypertension: Secondary | ICD-10-CM | POA: Diagnosis not present

## 2018-01-13 DIAGNOSIS — E669 Obesity, unspecified: Secondary | ICD-10-CM | POA: Diagnosis present

## 2018-01-13 DIAGNOSIS — I6340 Cerebral infarction due to embolism of unspecified cerebral artery: Secondary | ICD-10-CM

## 2018-01-13 DIAGNOSIS — F419 Anxiety disorder, unspecified: Secondary | ICD-10-CM | POA: Diagnosis present

## 2018-01-13 DIAGNOSIS — I63231 Cerebral infarction due to unspecified occlusion or stenosis of right carotid arteries: Secondary | ICD-10-CM | POA: Diagnosis not present

## 2018-01-13 DIAGNOSIS — Z6829 Body mass index (BMI) 29.0-29.9, adult: Secondary | ICD-10-CM | POA: Diagnosis not present

## 2018-01-13 DIAGNOSIS — R531 Weakness: Secondary | ICD-10-CM | POA: Diagnosis not present

## 2018-01-13 DIAGNOSIS — R0989 Other specified symptoms and signs involving the circulatory and respiratory systems: Secondary | ICD-10-CM | POA: Diagnosis not present

## 2018-01-13 DIAGNOSIS — Z86718 Personal history of other venous thrombosis and embolism: Secondary | ICD-10-CM | POA: Diagnosis not present

## 2018-01-13 DIAGNOSIS — F17200 Nicotine dependence, unspecified, uncomplicated: Secondary | ICD-10-CM

## 2018-01-13 DIAGNOSIS — R29707 NIHSS score 7: Secondary | ICD-10-CM | POA: Diagnosis not present

## 2018-01-13 DIAGNOSIS — I63511 Cerebral infarction due to unspecified occlusion or stenosis of right middle cerebral artery: Secondary | ICD-10-CM | POA: Diagnosis not present

## 2018-01-13 DIAGNOSIS — K5900 Constipation, unspecified: Secondary | ICD-10-CM | POA: Diagnosis not present

## 2018-01-13 DIAGNOSIS — I63411 Cerebral infarction due to embolism of right middle cerebral artery: Secondary | ICD-10-CM | POA: Diagnosis not present

## 2018-01-13 DIAGNOSIS — R29715 NIHSS score 15: Secondary | ICD-10-CM | POA: Diagnosis not present

## 2018-01-13 DIAGNOSIS — R569 Unspecified convulsions: Secondary | ICD-10-CM | POA: Diagnosis not present

## 2018-01-13 DIAGNOSIS — I2510 Atherosclerotic heart disease of native coronary artery without angina pectoris: Secondary | ICD-10-CM | POA: Diagnosis not present

## 2018-01-13 DIAGNOSIS — Z66 Do not resuscitate: Secondary | ICD-10-CM | POA: Diagnosis present

## 2018-01-13 DIAGNOSIS — R29711 NIHSS score 11: Secondary | ICD-10-CM | POA: Diagnosis not present

## 2018-01-13 DIAGNOSIS — W1830XA Fall on same level, unspecified, initial encounter: Secondary | ICD-10-CM | POA: Diagnosis present

## 2018-01-13 DIAGNOSIS — Z8673 Personal history of transient ischemic attack (TIA), and cerebral infarction without residual deficits: Secondary | ICD-10-CM | POA: Diagnosis not present

## 2018-01-13 DIAGNOSIS — M109 Gout, unspecified: Secondary | ICD-10-CM | POA: Diagnosis not present

## 2018-01-13 DIAGNOSIS — E1151 Type 2 diabetes mellitus with diabetic peripheral angiopathy without gangrene: Secondary | ICD-10-CM | POA: Diagnosis not present

## 2018-01-13 DIAGNOSIS — I252 Old myocardial infarction: Secondary | ICD-10-CM | POA: Diagnosis not present

## 2018-01-13 DIAGNOSIS — Z7982 Long term (current) use of aspirin: Secondary | ICD-10-CM | POA: Diagnosis not present

## 2018-01-13 DIAGNOSIS — Z8249 Family history of ischemic heart disease and other diseases of the circulatory system: Secondary | ICD-10-CM | POA: Diagnosis not present

## 2018-01-13 DIAGNOSIS — E785 Hyperlipidemia, unspecified: Secondary | ICD-10-CM | POA: Diagnosis not present

## 2018-01-13 DIAGNOSIS — Z9114 Patient's other noncompliance with medication regimen: Secondary | ICD-10-CM | POA: Diagnosis not present

## 2018-01-13 DIAGNOSIS — R29810 Facial weakness: Secondary | ICD-10-CM | POA: Diagnosis present

## 2018-01-13 DIAGNOSIS — E46 Unspecified protein-calorie malnutrition: Secondary | ICD-10-CM | POA: Diagnosis not present

## 2018-01-13 DIAGNOSIS — I639 Cerebral infarction, unspecified: Secondary | ICD-10-CM | POA: Diagnosis not present

## 2018-01-13 DIAGNOSIS — Z7951 Long term (current) use of inhaled steroids: Secondary | ICD-10-CM | POA: Diagnosis not present

## 2018-01-13 DIAGNOSIS — F17210 Nicotine dependence, cigarettes, uncomplicated: Secondary | ICD-10-CM | POA: Diagnosis present

## 2018-01-13 DIAGNOSIS — D62 Acute posthemorrhagic anemia: Secondary | ICD-10-CM | POA: Diagnosis not present

## 2018-01-13 DIAGNOSIS — L299 Pruritus, unspecified: Secondary | ICD-10-CM | POA: Diagnosis not present

## 2018-01-13 DIAGNOSIS — Z7902 Long term (current) use of antithrombotics/antiplatelets: Secondary | ICD-10-CM | POA: Diagnosis not present

## 2018-01-13 DIAGNOSIS — G40909 Epilepsy, unspecified, not intractable, without status epilepticus: Secondary | ICD-10-CM | POA: Diagnosis present

## 2018-01-13 DIAGNOSIS — I110 Hypertensive heart disease with heart failure: Secondary | ICD-10-CM | POA: Diagnosis not present

## 2018-01-13 DIAGNOSIS — I6329 Cerebral infarction due to unspecified occlusion or stenosis of other precerebral arteries: Secondary | ICD-10-CM | POA: Diagnosis not present

## 2018-01-13 DIAGNOSIS — E1169 Type 2 diabetes mellitus with other specified complication: Secondary | ICD-10-CM | POA: Diagnosis not present

## 2018-01-13 DIAGNOSIS — F329 Major depressive disorder, single episode, unspecified: Secondary | ICD-10-CM | POA: Diagnosis present

## 2018-01-13 LAB — GLUCOSE, CAPILLARY: Glucose-Capillary: 97 mg/dL (ref 70–99)

## 2018-01-13 LAB — LIPID PANEL
Cholesterol: 161 mg/dL (ref 0–200)
HDL: 30 mg/dL — ABNORMAL LOW (ref >40)
LDL Cholesterol: 98 mg/dL (ref 0–99)
TRIGLYCERIDES: 167 mg/dL — AB (ref <150)
Total CHOL/HDL Ratio: 5.4 RATIO
VLDL: 33 mg/dL (ref 0–40)

## 2018-01-13 LAB — HEMOGLOBIN A1C
Hgb A1c MFr Bld: 5.4 % (ref 4.8–5.6)
Mean Plasma Glucose: 108.28 mg/dL

## 2018-01-13 MED ORDER — ADULT MULTIVITAMIN W/MINERALS CH
1.0000 | ORAL_TABLET | Freq: Every day | ORAL | Status: DC
  Administered 2018-01-13 – 2018-01-18 (×6): 1 via ORAL
  Filled 2018-01-13 (×6): qty 1

## 2018-01-13 MED ORDER — ALBUTEROL SULFATE (2.5 MG/3ML) 0.083% IN NEBU: 2.5000 mg | INHALATION_SOLUTION | Freq: Four times a day (QID) | RESPIRATORY_TRACT | Status: DC | PRN

## 2018-01-13 MED ORDER — NICOTINE 21 MG/24HR TD PT24
21.0000 mg | MEDICATED_PATCH | Freq: Every day | TRANSDERMAL | Status: DC
  Administered 2018-01-13 – 2018-01-18 (×6): 21 mg via TRANSDERMAL
  Filled 2018-01-13 (×6): qty 1

## 2018-01-13 MED ORDER — ENOXAPARIN SODIUM 40 MG/0.4ML ~~LOC~~ SOLN
40.0000 mg | SUBCUTANEOUS | Status: DC
  Administered 2018-01-15 – 2018-01-18 (×4): 40 mg via SUBCUTANEOUS
  Filled 2018-01-13 (×4): qty 0.4

## 2018-01-13 MED ORDER — SODIUM CHLORIDE 0.9 % IV SOLN: INTRAVENOUS | Status: DC

## 2018-01-13 MED ORDER — ENSURE ENLIVE PO LIQD
237.0000 mL | Freq: Two times a day (BID) | ORAL | Status: DC
  Administered 2018-01-13 – 2018-01-17 (×7): 237 mL via ORAL

## 2018-01-13 MED ORDER — ROPINIROLE HCL 1 MG PO TABS
0.5000 mg | ORAL_TABLET | Freq: Every day | ORAL | Status: DC
  Administered 2018-01-13 – 2018-01-17 (×5): 0.5 mg via ORAL
  Filled 2018-01-13: qty 1
  Filled 2018-01-13: qty 0.5
  Filled 2018-01-13: qty 1
  Filled 2018-01-13: qty 0.5
  Filled 2018-01-13 (×2): qty 1

## 2018-01-13 MED ORDER — FOLIC ACID 1 MG PO TABS
1.0000 mg | ORAL_TABLET | Freq: Every day | ORAL | Status: DC
  Administered 2018-01-13 – 2018-01-18 (×6): 1 mg via ORAL
  Filled 2018-01-13 (×6): qty 1

## 2018-01-13 MED ORDER — ATORVASTATIN CALCIUM 40 MG PO TABS: 40.0000 mg | ORAL_TABLET | Freq: Every day | ORAL | Status: DC

## 2018-01-13 MED ORDER — ACETAMINOPHEN 650 MG RE SUPP: 650.0000 mg | RECTAL | Status: DC | PRN

## 2018-01-13 MED ORDER — NAPHAZOLINE-PHENIRAMINE 0.025-0.3 % OP SOLN
1.0000 [drp] | Freq: Four times a day (QID) | OPHTHALMIC | Status: DC | PRN
  Filled 2018-01-13: qty 15

## 2018-01-13 MED ORDER — LORAZEPAM 2 MG/ML IJ SOLN
1.0000 mg | Freq: Four times a day (QID) | INTRAMUSCULAR | Status: AC | PRN
  Administered 2018-01-14: 1 mg via INTRAVENOUS
  Filled 2018-01-13: qty 1

## 2018-01-13 MED ORDER — ENOXAPARIN SODIUM 40 MG/0.4ML ~~LOC~~ SOLN
40.0000 mg | SUBCUTANEOUS | Status: DC
  Administered 2018-01-13: 40 mg via SUBCUTANEOUS
  Filled 2018-01-13: qty 0.4

## 2018-01-13 MED ORDER — LEVETIRACETAM 500 MG PO TABS
500.0000 mg | ORAL_TABLET | Freq: Two times a day (BID) | ORAL | Status: DC
  Administered 2018-01-13 – 2018-01-18 (×11): 500 mg via ORAL
  Filled 2018-01-13 (×11): qty 1

## 2018-01-13 MED ORDER — DULOXETINE HCL 60 MG PO CPEP
60.0000 mg | ORAL_CAPSULE | Freq: Every day | ORAL | Status: DC
  Administered 2018-01-13 – 2018-01-18 (×6): 60 mg via ORAL
  Filled 2018-01-13 (×6): qty 1

## 2018-01-13 MED ORDER — SENNOSIDES-DOCUSATE SODIUM 8.6-50 MG PO TABS: 1.0000 | ORAL_TABLET | Freq: Every evening | ORAL | Status: DC | PRN

## 2018-01-13 MED ORDER — STROKE: EARLY STAGES OF RECOVERY BOOK
Freq: Once | Status: AC
  Administered 2018-01-13: 14:00:00
  Filled 2018-01-13 (×2): qty 1

## 2018-01-13 MED ORDER — MEMANTINE HCL 10 MG PO TABS
10.0000 mg | ORAL_TABLET | Freq: Two times a day (BID) | ORAL | Status: DC
  Administered 2018-01-13 – 2018-01-18 (×10): 10 mg via ORAL
  Filled 2018-01-13 (×10): qty 1

## 2018-01-13 MED ORDER — ACETAMINOPHEN 325 MG PO TABS: 650.0000 mg | ORAL_TABLET | ORAL | Status: DC | PRN

## 2018-01-13 MED ORDER — VITAMIN B-1 100 MG PO TABS
100.0000 mg | ORAL_TABLET | Freq: Every day | ORAL | Status: DC
  Administered 2018-01-13 – 2018-01-17 (×5): 100 mg via ORAL
  Filled 2018-01-13 (×5): qty 1

## 2018-01-13 MED ORDER — ATORVASTATIN CALCIUM 80 MG PO TABS
80.0000 mg | ORAL_TABLET | Freq: Every day | ORAL | Status: DC
  Administered 2018-01-13 – 2018-01-17 (×5): 80 mg via ORAL
  Filled 2018-01-13 (×5): qty 1

## 2018-01-13 MED ORDER — ASPIRIN 300 MG RE SUPP: 300.0000 mg | Freq: Every day | RECTAL | Status: DC

## 2018-01-13 MED ORDER — PREGABALIN 75 MG PO CAPS
150.0000 mg | ORAL_CAPSULE | Freq: Two times a day (BID) | ORAL | Status: DC
  Administered 2018-01-13 – 2018-01-18 (×10): 150 mg via ORAL
  Filled 2018-01-13 (×10): qty 2

## 2018-01-13 MED ORDER — ACETAMINOPHEN 160 MG/5ML PO SOLN: 650.0000 mg | ORAL | Status: DC | PRN

## 2018-01-13 MED ORDER — LORAZEPAM 1 MG PO TABS: 1.0000 mg | ORAL_TABLET | Freq: Four times a day (QID) | ORAL | Status: AC | PRN

## 2018-01-13 MED ORDER — INSULIN ASPART 100 UNIT/ML ~~LOC~~ SOLN: 0.0000 [IU] | Freq: Every day | SUBCUTANEOUS | Status: DC

## 2018-01-13 MED ORDER — MOMETASONE FURO-FORMOTEROL FUM 200-5 MCG/ACT IN AERO
2.0000 | INHALATION_SPRAY | Freq: Two times a day (BID) | RESPIRATORY_TRACT | Status: DC
  Administered 2018-01-13 – 2018-01-18 (×10): 2 via RESPIRATORY_TRACT
  Filled 2018-01-13: qty 8.8

## 2018-01-13 MED ORDER — CLOPIDOGREL BISULFATE 75 MG PO TABS
75.0000 mg | ORAL_TABLET | Freq: Every day | ORAL | Status: DC
  Administered 2018-01-13 – 2018-01-18 (×6): 75 mg via ORAL
  Filled 2018-01-13 (×6): qty 1

## 2018-01-13 MED ORDER — KETOROLAC TROMETHAMINE 15 MG/ML IJ SOLN
15.0000 mg | Freq: Three times a day (TID) | INTRAMUSCULAR | Status: AC | PRN
  Administered 2018-01-13 – 2018-01-17 (×6): 15 mg via INTRAVENOUS
  Filled 2018-01-13 (×6): qty 1

## 2018-01-13 MED ORDER — INSULIN ASPART 100 UNIT/ML ~~LOC~~ SOLN
0.0000 [IU] | Freq: Three times a day (TID) | SUBCUTANEOUS | Status: DC
  Administered 2018-01-14 – 2018-01-17 (×5): 2 [IU] via SUBCUTANEOUS
  Administered 2018-01-18: 3 [IU] via SUBCUTANEOUS

## 2018-01-13 MED ORDER — ASPIRIN 325 MG PO TABS
325.0000 mg | ORAL_TABLET | Freq: Every day | ORAL | Status: DC
  Administered 2018-01-13 – 2018-01-18 (×6): 325 mg via ORAL
  Filled 2018-01-13 (×6): qty 1

## 2018-01-13 MED ORDER — HYDROXYZINE HCL 25 MG PO TABS: 25.0000 mg | ORAL_TABLET | Freq: Four times a day (QID) | ORAL | Status: DC | PRN

## 2018-01-13 MED ORDER — PANTOPRAZOLE SODIUM 40 MG PO TBEC
40.0000 mg | DELAYED_RELEASE_TABLET | Freq: Every day | ORAL | Status: DC
  Administered 2018-01-13 – 2018-01-18 (×6): 40 mg via ORAL
  Filled 2018-01-13 (×6): qty 1

## 2018-01-13 MED ORDER — THIAMINE HCL 100 MG/ML IJ SOLN
100.0000 mg | Freq: Every day | INTRAMUSCULAR | Status: DC
  Administered 2018-01-18: 100 mg via INTRAVENOUS
  Filled 2018-01-13 (×3): qty 2

## 2018-01-13 MED ORDER — ALLOPURINOL 100 MG PO TABS
300.0000 mg | ORAL_TABLET | Freq: Every day | ORAL | Status: DC
  Administered 2018-01-13 – 2018-01-18 (×6): 300 mg via ORAL
  Filled 2018-01-13 (×6): qty 3

## 2018-01-13 MED ORDER — LACTULOSE 10 GM/15ML PO SOLN: 10.0000 g | Freq: Two times a day (BID) | ORAL | Status: DC | PRN

## 2018-01-13 NOTE — ED Notes (Signed)
This RN contacted 3W RN regarding patient coming up to bed 14, she advised patient may come up at this time.

## 2018-01-13 NOTE — Evaluation (Signed)
Physical Therapy Evaluation Patient Details Name: [name] MRN: [id number] DOB: 1950/11/12 Today's Date: 01/13/2018   History of Present Illness  [name] is an 67 y.o. male past medical history of hyperlipidemia, diabetes mellitus, coronary artery disease, alcohol abuse, congestive heart failure, COPD,CVA with baseline left-sided weakness, chronically occluded right ICA  presents to the emergency department on 12/4 for 2 to 3-day history of worsening left-sided weakness and left facial numbness.He finally came to the ER when he fell on the floor and could not get up and had to call EMS. He underwent MRI brain in the emergency department showed acute multiple infarcts in the right MCA territory in addition to old chronic right MCA infarcts.   Clinical Impression  Patient presents with worsening hemiparesis L UE than prior to this episode.  At home he was also limited due to multiple falls and recently lost his DME at ALF and is currently unsafe to return to his apartment at [address].  He was functioning with assist of an aide 5-7 days a week for 3 hours a day.  Feel he can benefit from CIR level rehab to maximize mobility/safety and research options to replace lost equipment to improve safety and return home with intermittent aide assist.  PT to follow acutely.    Follow Up Recommendations CIR    Equipment Recommendations  Wheelchair cushion (measurements PT);Wheelchair (measurements PT);Other (comment)(possibly quad cane vs hemiwalker)    Recommendations for Other Services       Precautions / Restrictions Precautions Precautions: Fall Precaution Comments: reports at times multiple falls in one day Restrictions Weight Bearing Restrictions: No      Mobility  Bed Mobility Overal bed mobility: Needs Assistance Bed Mobility: Supine to Sit     Supine to sit: Supervision;HOB elevated     General bed mobility comments: heavy use of bed rail and increased time two  attempts for success  Transfers Overall transfer level: Needs assistance Equipment used: None;Straight cane Transfers: Sit to/from [facility] Sit to Stand: Min assist Stand pivot transfers: Min assist       General transfer comment: assist to stabilize, initially from bed no device, then with cane from recliner  Ambulation/Gait Ambulation/Gait assistance: Min assist Gait Distance (Feet): 80 Feet Assistive device: Straight cane Gait Pattern/deviations: Step-to pattern;Decreased stride length;Wide base of support     General Gait Details: increased time and very carefully places L foot for balance, and to avoid buckling, increased time for turns, assist needed for balance/safety  Stairs            Wheelchair Mobility    Modified Rankin (Stroke Patients Only) Modified Rankin (Stroke Patients Only) Pre-Morbid Rankin Score: Moderately severe disability Modified Rankin: Moderately severe disability     Balance Overall balance assessment: Needs assistance   Sitting balance-Leahy Scale: Fair     Standing balance support: Single extremity supported Standing balance-Leahy Scale: Poor Standing balance comment: must have UE support to stand and minguard                              Pertinent Vitals/Pain Pain Assessment: 0-10 Pain Score: 8  Pain Location: L hip Pain Descriptors / Indicators: Throbbing Pain Intervention(s): Monitored during session;Repositioned    Home Living Family/patient expects to be discharged to:: Private residence Living Arrangements: Alone Available Help at Discharge: Personal care attendant(3 hours M-F some times S&S) Type of Home: Apartment([address]) Home Access: Elevator  Home Layout: One level Home Equipment: Cane - single point;Shower seat;Walker - 4 wheels;Grab bars - tub/shower;Bedside commode;Wheelchair - power Additional Comments: had a manual w/c but got lost in ALF in [place] and they lost the  power cord for electric w/c    Prior Function Level of Independence: Needs assistance   Gait / Transfers Assistance Needed: walks with cane   ADL's / Homemaking Assistance Needed: needs assist; aide helps with bath, clothing, medications, meals and takes to appointments        Hand Dominance   Dominant Hand: Right    Extremity/Trunk Assessment   Upper Extremity Assessment Upper Extremity Assessment: LUE deficits/detail LUE Deficits / Details: flaccid L UE reports was able to lift some and able to make a light fist prior to this episode LUE Sensation: decreased light touch    Lower Extremity Assessment Lower Extremity Assessment: LLE deficits/detail LLE Deficits / Details: AAROM WFL, strength hip flexion 3-/5, note extensor tone, but able to isolate movement in ankle, could not flex knee without assist LLE Sensation: decreased light touch       Communication   Communication: No difficulties  Cognition Arousal/Alertness: Awake/alert Behavior During Therapy: WFL for tasks assessed/performed Overall Cognitive Status: No family/caregiver present to determine baseline cognitive functioning                                        General Comments General comments (skin integrity, edema, etc.): Educated on L hand on pillow, reports at times at home would get caught in wheelchair, etc    Exercises     Assessment/Plan    PT Assessment Patient needs continued PT services  PT Problem List Decreased strength;Decreased mobility;Decreased balance;Decreased knowledge of use of DME;Decreased safety awareness       PT Treatment Interventions DME instruction;Therapeutic activities;Gait training;Therapeutic exercise;Patient/family education;Balance training;Functional mobility training    PT Goals (Current goals can be found in the Care Plan section)  Acute Rehab PT Goals Patient Stated Goal: to return home PT Goal Formulation: With patient Time For Goal  Achievement: 01/27/18 Potential to Achieve Goals: Good    Frequency Min 4X/week   Barriers to discharge        Co-evaluation               AM-PAC PT "6 Clicks" Mobility  Outcome Measure Help needed turning from your back to your side while in a flat bed without using bedrails?: A Lot Help needed moving from lying on your back to sitting on the side of a flat bed without using bedrails?: A Lot Help needed moving to and from a bed to a chair (including a wheelchair)?: A Little Help needed standing up from a chair using your arms (e.g., wheelchair or bedside chair)?: A Little Help needed to walk in hospital room?: A Little Help needed climbing 3-5 steps with a railing? : A Lot 6 Click Score: 15    End of Session Equipment Utilized During Treatment: Gait belt Activity Tolerance: Patient tolerated treatment well Patient left: with call bell/phone within reach;with chair alarm set;in chair Nurse Communication: Mobility status PT Visit Diagnosis: Other abnormalities of gait and mobility (R26.89);Hemiplegia and hemiparesis Hemiplegia - Right/Left: Left Hemiplegia - dominant/non-dominant: Non-dominant Hemiplegia - caused by: Cerebral infarction    Time: 5784-6962 PT Time Calculation (min) (ACUTE ONLY): 33 min   Charges:   PT Evaluation $PT Eval Moderate Complexity: 1 Mod PT  Treatments $Gait Training: 8-22 mins        Magda Kiel, [place] [phone] 01/13/2018   Reginia Naas 01/13/2018, 6:14 PM

## 2018-01-13 NOTE — H&P (Signed)
TRH H&P   Patient Demographics:    [name], is a 67 y.o. male  MRN: [id number]   DOB - 1950-03-06  Admit Date - 01/12/2018  Outpatient Primary MD for the patient is Charlott Rakes, MD  Referring MD: Dr. Roxanne Mins  Outpatient Specialists: [facility] neurology  Patient coming from: Home  Chief Complaint  Patient presents with  . Weakness  . Fall      HPI:    [name]  is a 67 y.o. male, with a history of recurrent hospitalization for embolic stroke, last hospitalization in September with multifocal right-sided infarct suspected due to chronically occluded right ICA stent.  He was discharged to SNF with plan to continue DAPT and statin.  He was then hospitalized 1 month later with encephalopathy of unclear etiology. Patient currently living at home and has a home health aide who comes in 7 days a week and gives him his medications.  Patient had minimal left-sided hemiparesis from prior stroke and was ambulating with a cane.  Other medical history include coronary artery disease, chronic diastolic CHF, PVD, OSA, hypertension, diet-controlled diabetes mellitus, COPD with active tobacco use.  Patient reported having increasing left-sided weakness for past 2 days (reported left facial numbness to the neurologist as well).  Last evening he fell on the floor landing on the left side and could not get up.  He was also unable to move his left extremities. EMS brought him to the ED where he underwent CT head which was negative for acute findings.  MRI MRI of the brain showing acute multiple infarcts in the right MCA territory in addition to his chronic right MCA infarct. Vitals were stable.  Blood work was unremarkable. TPA not given as patient was out of the therapeutic window for TPA.  Neurology consulted and patient admitted to telemetry for acute embolic stroke. Patient reports  some pain in the left lateral chest where he landed and the pain to be worse on deep inspiration.  Patient is a very poor historian.  He denies any headache, blurred vision, slurred speech, dizziness, loss of consciousness, chest pain, shortness of breath, palpitations, abdominal pain, dysuria, diarrhea, tingling or numbness of the extremities.  He reports being adherent to his medications (they are provided by home health aide).  He continues to smoke and reports last cocaine use was yesterday.  Reports drinking alcohol infrequently (1-2 drinks, once a week or so).    Review of systems:    In addition to the HPI above, Left-sided weakness++++, pain in left lateral chest No Fever-chills, No Headache, No changes with Vision or hearing, No problems swallowing food or Liquids, No Chest pain, Cough or Shortness of Breath, No Abdominal pain, No Nausea or vomiting, Bowel movements are regular, No Blood in stool or Urine, No dysuria, No new skin rashes or bruises, No new joints pains-aches,  No new  tingling, numbness in any extremity, No recent weight gain or loss, No polyuria, polydypsia or polyphagia, No significant Mental Stressors.    With Past History of the following :    Past Medical History:  Diagnosis Date  . Alcohol abuse    H/O  . Anxiety   . Arthritis   . Asthma   . CAD (coronary artery disease) 05/27/10   Cath: severe single vessell CAD left cx midportion obtuse marginal 2 to 3.  [name] CHF (congestive heart failure) ([place])   . COPD (chronic obstructive pulmonary disease) ([place])   . COPD (chronic obstructive pulmonary disease) ([place])   . Depression   . Diabetes mellitus, type 2 ([place]) 03/13/2015  . GERD (gastroesophageal reflux disease)   . HCAP (healthcare-associated pneumonia) 10/15/2015  . History of DVT of lower extremity   . Hyperlipemia   . Hyperlipidemia   . Hypertension   . Myocardial infarction ([place]) 2000  . Orbital fracture 12/2012  . OSA on CPAP   . Peripheral  vascular disease, unspecified ([place])    08/20/10 doppler: increase in right ABI post-op. Left ABI stable. S/P bi-fem bypass surgery  . Pneumonia 04/04/2012  . Shortness of breath   . Stroke ([place])   . Tobacco abuse       Past Surgical History:  Procedure Laterality Date  . abdoninal ao angio & bifem angio  05/27/10   Patent graft, occluded bil stents with no retrograde flow into the hypogastric arteries. 100% occl left ant. tibial artery, 70% to 80% to 100% stenosis right superficial fem artery above adductor canal. 100 % occl right ant. tibial vessell  . Aortogram w/ PTCA  09/292003  . BACK SURGERY    . CARDIAC CATHETERIZATION  05/27/10   severe CAD left cx  . CHOLECYSTECTOMY    . ESOPHAGOGASTRIC FUNDOPLICATION    . EYE SURGERY    . FEMORAL BYPASS  08/19/10   Right Fem-Pop  . IR ANGIO INTRA EXTRACRAN SEL COM CAROTID INNOMINATE BILAT MOD SED  07/20/2016  . IR ANGIO VERTEBRAL SEL SUBCLAVIAN INNOMINATE UNI R MOD SED  07/20/2016  . IR ANGIO VERTEBRAL SEL VERTEBRAL UNI L MOD SED  07/20/2016  . IR RADIOLOGIST EVAL & MGMT  05/27/2016  . JOINT REPLACEMENT    . PERIPHERAL VASCULAR CATHETERIZATION N/A 12/10/2014   Procedure: Abdominal Aortogram;  Surgeon: Angelia Mould, MD;  Location: [facility];  Service: Cardiovascular;  Laterality: N/A;  . RADIOLOGY WITH ANESTHESIA N/A 02/08/2015   Procedure: RADIOLOGY WITH ANESTHESIA;  Surgeon: Luanne Bras, MD;  Location: [facility];  Service: Radiology;  Laterality: N/A;  . TEE WITHOUT CARDIOVERSION N/A 08/29/2014   Procedure: TRANSESOPHAGEAL ECHOCARDIOGRAM (TEE);  Surgeon: Josue Hector, MD;  [facility];  Service: Cardiovascular;  Laterality: N/A;  . TOTAL KNEE ARTHROPLASTY        Social History:     Social History   Tobacco Use  . Smoking status: Current Every Day Smoker    Packs/day: 0.50    Years: 50.00    Pack years: 25.00    Types: Cigars, Cigarettes    Start date: 02/09/1961  . Smokeless tobacco: Former Systems developer     Types: Chew  . Tobacco comment: 2 ppd, full flavor  Substance Use Topics  . Alcohol use: Yes     Lives -home  Mobility -ambulates with a cane    Family History :     Family History  Problem Relation Age of Onset  . Heart attack Mother   .  Heart failure Mother   . Cirrhosis Father   . Heart failure Brother   . Cancer Brother   . Hypertension Neg Hx        UNKNOWN  . Stroke Neg Hx        UNKNOWN      Home Medications:   Prior to Admission medications   Medication Sig Start Date End Date Taking? Authorizing Provider  ACCU-CHEK SOFTCLIX LANCETS lancets Use 3 times daily before meals 09/17/15  Yes Newlin, Enobong, MD  albuterol (PROAIR HFA) 108 (90 Base) MCG/ACT inhaler INHALE 2 PUFFS EVERY 6 HOURS AS NEEDED FOR WHEEZING OR SHORTNESS OF BREATH Patient taking differently: Inhale 2 puffs into the lungs every 6 (six) hours as needed for wheezing or shortness of breath.  10/05/17  Yes Newlin, Enobong, MD  allopurinol (ZYLOPRIM) 300 MG tablet Take 1 tablet (300 mg total) by mouth daily. 01/12/18  Yes Charlott Rakes, MD  aspirin 325 MG tablet Take 1 tablet (325 mg total) by mouth daily. Patient taking differently: Take 81 mg by mouth daily.  10/30/17  Yes Patrecia Pour, MD  atorvastatin (LIPITOR) 80 MG tablet Take 1 tablet (80 mg total) by mouth daily at 6 PM. 01/12/18  Yes Newlin, Enobong, MD  Blood Glucose Monitoring Suppl (ACCU-CHEK AVIVA) device Use 3 times daily before meals. 03/13/15  Yes Charlott Rakes, MD  carvedilol (COREG) 12.5 MG tablet Take 1 tablet (12.5 mg total) by mouth 2 (two) times daily with a meal. 01/12/18  Yes Newlin, Enobong, MD  clopidogrel (PLAVIX) 75 MG tablet Take 1 tablet (75 mg total) by mouth daily. 01/12/18  Yes Charlott Rakes, MD  DULoxetine (CYMBALTA) 60 MG capsule Take 1 capsule (60 mg total) by mouth daily. 01/12/18  Yes Newlin, Charlane Ferretti, MD  Fluticasone-Salmeterol (ADVAIR DISKUS) 250-50 MCG/DOSE AEPB Inhale 1 puff into the lungs 2 (two) times daily. 01/12/18   Yes Charlott Rakes, MD  furosemide (LASIX) 20 MG tablet Take 2 tablets (40 mg total) by mouth 2 (two) times daily. 10/08/17  Yes Newlin, Charlane Ferretti, MD  glucose blood (ACCU-CHEK AVIVA) test strip Use 3 times daily before meals as directed. 01/29/16  Yes Charlott Rakes, MD  hydrocortisone cream 1 % Apply 1 application topically 2 (two) times daily as needed for itching.    Yes [provider]  hydrOXYzine (ATARAX/VISTARIL) 25 MG tablet Take 25 mg by mouth every 6 (six) hours as needed for anxiety or itching.    Yes [provider]  lactulose (CHRONULAC) 10 GM/15ML solution Take 15 mLs (10 g total) by mouth 2 (two) times daily as needed for mild constipation. 08/19/17  Yes Charlott Rakes, MD  Lancet Devices [facility]) lancets Use 3 times daily before meals 09/27/15  Yes Newlin, Enobong, MD  levETIRAcetam (KEPPRA) 500 MG tablet Take 1 tablet (500 mg total) by mouth 2 (two) times daily. 01/12/18  Yes Charlott Rakes, MD  memantine (NAMENDA) 10 MG tablet Take 1 tablet (10 mg total) by mouth 2 (two) times daily. 01/12/18  Yes Newlin, Charlane Ferretti, MD  naphazoline-pheniramine (NAPHCON-A) 0.025-0.3 % ophthalmic solution Place 1 drop into the left eye 4 (four) times daily as needed for eye irritation. 10/29/17  Yes Patrecia Pour, MD  pantoprazole (PROTONIX) 40 MG tablet Take 1 tablet (40 mg total) by mouth daily. 01/12/18  Yes Charlott Rakes, MD  pregabalin (LYRICA) 150 MG capsule Take 1 capsule (150 mg total) by mouth 2 (two) times daily. 01/12/18  Yes Charlott Rakes, MD  rOPINIRole (REQUIP) 0.5 MG tablet  Take 1 tablet (0.5 mg total) by mouth at bedtime. 01/12/18  Yes Charlott Rakes, MD  tiZANidine (ZANAFLEX) 4 MG tablet Take 1 tablet (4 mg total) by mouth every 8 (eight) hours as needed for muscle spasms. 01/12/18  Yes Charlott Rakes, MD  traZODone (DESYREL) 150 MG tablet Take 1 tablet (150 mg total) by mouth at bedtime as needed for sleep. Patient not taking: Reported on 11/28/2017 08/19/17    Charlott Rakes, MD     Allergies:     Allergies  Allergen Reactions  . Darvocet [Propoxyphene N-Acetaminophen] Hives  . Haldol [Haloperidol Decanoate] Hives  . Acetaminophen Nausea Only    Upset stomach, tolerates Hydrocodone/APAP if taken with food  . Metformin And Related Nausea And Vomiting  . Norco [Hydrocodone-Acetaminophen] Nausea And Vomiting    Tolerates if taken with food     Physical Exam:   Vitals  Blood pressure (!) 151/66, pulse 61, temperature (!) 97.4 F (36.3 C), temperature source Oral, resp. rate 20, SpO2 95 %.   General: Elderly male lying in bed in no acute distress HEENT: Pupils reactive bilaterally, EOMI, no pallor, no icterus, supple neck, no facial droop, moist mucosa, no cervical lymphadenopathy, carotid bruit not appreciated Chest: Clear to auscultation bilaterally, minimal tenderness over the left lower lateral ribs CVS: Normal S1 and S2, no murmurs rub or gallop GI: Soft, nondistended, nontender, bowel sounds present Musculoskeletal: Warm, no edema, normal skin CNS: Alert and oriented x3,3/5 power in left upper and lower extremity, plantars downgoing bilaterally, normal sensations in all extremities, cerebellar function and gait not assessed.     Data Review:    CBC Recent Labs  Lab 01/12/18 2111 01/12/18 2125  WBC 6.7  --   HGB 13.4 15.3  HCT 45.1 45.0  PLT 220  --   MCV 90.6  --   MCH 26.9  --   MCHC 29.7*  --   RDW 14.1  --   LYMPHSABS 1.4  --   MONOABS 0.6  --   EOSABS 0.2  --   BASOSABS 0.0  --    ------------------------------------------------------------------------------------------------------------------  Chemistries  Recent Labs  Lab 01/12/18 2111 01/12/18 2125  NA 144 144  K 3.7 4.1  CL 107 108  CO2 33*  --   GLUCOSE 121* 115*  BUN 13 18  CREATININE 1.08 1.00  CALCIUM 9.6  --   AST 14*  --   ALT 13  --   ALKPHOS 78  --   BILITOT 0.6  --     ------------------------------------------------------------------------------------------------------------------ estimated creatinine clearance is 86.1 mL/min (by C-G formula based on SCr of 1 mg/dL). ------------------------------------------------------------------------------------------------------------------ No results for input(s): TSH, T4TOTAL, T3FREE, THYROIDAB in the last 72 hours.  Invalid input(s): FREET3  Coagulation profile Recent Labs  Lab 01/12/18 2111  INR 1.01   ------------------------------------------------------------------------------------------------------------------- No results for input(s): DDIMER in the last 72 hours. -------------------------------------------------------------------------------------------------------------------  Cardiac Enzymes No results for input(s): CKMB, TROPONINI, MYOGLOBIN in the last 168 hours.  Invalid input(s): CK ------------------------------------------------------------------------------------------------------------------    Component Value Date/Time   BNP 35.6 10/25/2017 2100     ---------------------------------------------------------------------------------------------------------------  Urinalysis    Component Value Date/Time   COLORURINE YELLOW 01/12/2018 2117   APPEARANCEUR CLEAR 01/12/2018 2117   LABSPEC 1.009 01/12/2018 2117   PHURINE 6.0 01/12/2018 2117   GLUCOSEU NEGATIVE 01/12/2018 2117   HGBUR NEGATIVE 01/12/2018 2117   BILIRUBINUR NEGATIVE 01/12/2018 2117   KETONESUR NEGATIVE 01/12/2018 2117   PROTEINUR NEGATIVE 01/12/2018 2117   UROBILINOGEN 0.2 08/19/2014 2103  NITRITE NEGATIVE 01/12/2018 2117   LEUKOCYTESUR NEGATIVE 01/12/2018 2117    ----------------------------------------------------------------------------------------------------------------   Imaging Results:    Dg Chest 2 View  Result Date: 01/12/2018 CLINICAL DATA:  67 year old male with weakness. EXAM: CHEST - 2 VIEW  COMPARISON:  Chest radiograph dated 11/28/2017 FINDINGS: There is no new consolidation, pleural effusion, or pneumothorax. Bibasilar atelectasis noted. There is pleural thickening or a small loculated collection along the right lateral pleural surface, similar to prior radiograph. A 9 mm right upper lobe nodule again noted as seen previously. There is cardiomegaly. No acute osseous pathology. IMPRESSION: 1. No acute cardiopulmonary process. 2. Cardiomegaly. 3. Small loculated pleural effusion versus pleural thickening along the right lateral pleural surface. 4. A 9 mm right upper lobe nodule as seen previously. Chest CT may provide better evaluation on a nonemergent basis. Electronically Signed   By: Anner Crete M.D.   On: 01/12/2018 21:13   Ct Head Wo Contrast  Result Date: 01/12/2018 CLINICAL DATA:  Altered level of consciousness EXAM: CT HEAD WITHOUT CONTRAST TECHNIQUE: Contiguous axial images were obtained from the base of the skull through the vertex without intravenous contrast. COMPARISON:  MRI 11/29/2017 FINDINGS: Brain: Old right frontoparietal infarct, stable. There is atrophy and chronic small vessel disease changes. No acute intracranial abnormality. Specifically, no hemorrhage, hydrocephalus, mass lesion, acute infarction, or significant intracranial injury. Vascular: No hyperdense vessel or unexpected calcification. Skull: No acute calvarial abnormality. Sinuses/Orbits: No acute finding. Postoperative changes on the medial left orbital wall and orbital floor with mesh, stable. Other: None IMPRESSION: Old right frontal/parietal infarcts. No acute intracranial abnormality. Atrophy, chronic microvascular disease. Electronically Signed   By: Rolm Baptise M.D.   On: 01/12/2018 20:39   Mr Brain Wo Contrast  Result Date: 01/13/2018 CLINICAL DATA:  LEFT-sided weakness after fall. History of stroke, alcohol abuse, hypertension, hyperlipidemia and diabetes. EXAM: MRI HEAD WITHOUT CONTRAST TECHNIQUE:  Multiplanar, multiecho pulse sequences of the brain and surrounding structures were obtained without intravenous contrast. COMPARISON:  CT HEAD January 12, 2018 and MRI of the head November 29, 2017 FINDINGS: Mild motion degraded examination. INTRACRANIAL CONTENTS: Patchy reduced diffusion RIGHT frontal lobe towards the convexity with low ADC values. RIGHT frontoparietal encephalomalacia. Scattered chronic microhemorrhages and RIGHT superficial siderosis. Mild ex vacuo dilatation RIGHT lateral ventricle. Mild parenchymal brain volume loss. No hydrocephalus. Linear T2 bright signal RIGHT cerebral peduncle seen with wallerian degeneration. Patchy supratentorial and pontine white matter FLAIR T2 hyperintensities. No midline shift, mass effect or masses. VASCULAR: Similar loss of RIGHT internal carotid artery flow void to level of the carotid terminus. SKULL AND UPPER CERVICAL SPINE: No abnormal sellar expansion. No suspicious calvarial bone marrow signal. Craniocervical junction maintained. SINUSES/ORBITS: Minimal fluid signal RIGHT petrous apex. Chronic LEFT maxillary sinusitis with atresia. Mild paranasal sinus mucosal thickening. The included ocular globes and orbital contents are non-suspicious. Status post bilateral ocular lens implants. OTHER: Patient is edentulous. IMPRESSION: 1. Mild motion degraded examination. Multifocal acute small RIGHT frontal/MCA territory infarcts. 2. Old RIGHT MCA territory infarct.  Chronically occluded RIGHT ICA. 3. Similar parenchymal brain volume loss and mild-to-moderate chronic small vessel ischemic changes. 4. Stable chronic microhemorrhages and RIGHT superficial siderosis. Electronically Signed   By: Elon Alas M.D.   On: 01/13/2018 01:43    My personal review of EKG: Normal sinus rhythm at 68, no ST-T changes   Assessment & Plan:    Principal Problem:   CVA (cerebral vascular accident) ([place]) Acute embolic stroke Significant risk factors with recurrent stroke,  CAD, ongoing  tobacco and cocaine use, hypertension and diabetes. Appreciate neurology evaluation.  Recommend diagnostic catheter angiogram by neuro IR to assess if right carotid is completely occluded versus severely stenosed.  Recommend that if not completely occluded will need carotid stent.  IR consult placed and will be evaluated. Continue full dose aspirin.  I have held Plavix if patient will get procedure today.  Resume home dose statin (Lipitor 80 mg daily).  Allow permissive blood pressure.  LDL of 98 (unchanged from September).  A1c of 5.4. PT/OT evaluation.  Cleared swallow evaluation at bedside. Order 2D echo.  Monitor on telemetry.  Further recommendation per stroke team.  Active Problems: Polysubstance use/alcohol use Urine drug screen positive for cocaine.  Last use was yesterday.  Counseled on cessation including smoking.  No signs of alcohol withdrawal.  Monitor on CIWA.  Ordered nicotine patch.  Diabetes mellitus type 2 (HCC) Monitor on sliding scale coverage.  Not on any medications at home.  Chronic diastolic CHF /  Coronary artery disease involving native coronary artery of native heart without angina pectoris(HCC) Euvolemic.  Continue aspirin and statin for now.  Hold Coreg.    COPD (chronic obstructive pulmonary disease) (HCC) Symptomatic.  Continue home inhaler.  Fall with left-sided chest pain. No signs of rib fracture on chest x-ray.  Right upper lobe lung nodule (9 mm)  Noted on prior imaging as well.  Noncontrast CT recommended (has been ordered as outpatient)   DVT Prophylaxis: Lovenox  AM Labs Ordered, also please review Full Orders  Family Communication: Admission, patients condition and plan of care including tests being ordered have been discussed with the patient at bedside  Code Status: DNR  Likely DC to pending hospital course  Condition GUARDED    Consults called: Neurology/stroke, neuro IR  Admission status: Inpatient  Patient presented  with multifocal acute stroke, possibly embolic with left hemiparesis.  He needs to be monitored for stroke symptoms, further work-up including IR evaluation for possible carotid angiogram, physical therapy evaluation and medication adjustments which warrants him to be monitored for at least 2 midnights.   Time spent in minutes : 70   Ranita Stjulien M.D on 01/13/2018 at 8:29 AM  Between 7am to 7pm - Pager - [phone]. After 7pm go to [email or web address] - password [facility]  [facility] - Office  [phone]

## 2018-01-13 NOTE — ED Notes (Signed)
Pt returned from MRI °

## 2018-01-13 NOTE — ED Notes (Signed)
Pt in MRI.

## 2018-01-13 NOTE — Progress Notes (Addendum)
STROKE TEAM PROGRESS NOTE   HISTORY OF PRESENT ILLNESS (per record) [name] is an 67 y.o. male past medical history of hyperlipidemia, diabetes mellitus, coronary artery disease, alcohol abuse, congestive heart failure, COPD,CVA with baseline left-sided weakness, chronically occluded right ICA  presents to the emergency department on 12/4 for 2 to 3-day history of worsening left-sided weakness and left facial numbness.He finally came to the ER when he fell on the floor and could not get up and had to call EMS. He underwent MRI brain in the emergency department showed acute multiple infarcts in the right MCA territory in addition to old chronic right MCA infarcts.  He has known chronically occluded right ICA.  SUBJECTIVE (INTERVAL HISTORY) His main complaint today is pain from fall and he tells me his left arm won't move at all. Cerebral angiogram has been arranged for tomorrow. Stroke work up underway  ROS:  14 systems reviewed and negative except above   OBJECTIVE Vitals:   01/13/18 0741 01/13/18 0745 01/13/18 1059 01/13/18 1200  BP: (!) 151/66 (!) 151/66 135/65 135/65  Pulse: 61 61 61 61  Resp: 20  20   Temp: (!) 97.4 F (36.3 C)  97.7 F (36.5 C)   TempSrc: Oral  Oral   SpO2: 95%  94%     CBC:  Recent Labs  Lab 01/12/18 2111 01/12/18 2125  WBC 6.7  --   NEUTROABS 4.4  --   HGB 13.4 15.3  HCT 45.1 45.0  MCV 90.6  --   PLT 220  --     Basic Metabolic Panel:  Recent Labs  Lab 01/12/18 2111 01/12/18 2125  NA 144 144  K 3.7 4.1  CL 107 108  CO2 33*  --   GLUCOSE 121* 115*  BUN 13 18  CREATININE 1.08 1.00  CALCIUM 9.6  --     Lipid Panel:     Component Value Date/Time   CHOL 161 01/13/2018 0549   TRIG 167 (H) 01/13/2018 0549   HDL 30 (L) 01/13/2018 0549   CHOLHDL 5.4 01/13/2018 0549   VLDL 33 01/13/2018 0549   LDLCALC 98 01/13/2018 0549   HgbA1c:  Lab Results  Component Value Date   HGBA1C 5.4 01/13/2018   Urine Drug Screen:     Component  Value Date/Time   LABOPIA NONE DETECTED 01/12/2018 2117   COCAINSCRNUR POSITIVE (A) 01/12/2018 2117   LABBENZ NONE DETECTED 01/12/2018 2117   AMPHETMU NONE DETECTED 01/12/2018 2117   THCU NONE DETECTED 01/12/2018 2117   LABBARB NONE DETECTED 01/12/2018 2117    Alcohol Level     Component Value Date/Time   [facility] <10 01/12/2018 2111    IMAGING Reviewed:   Dg Chest 2 View  Result Date: 01/12/2018 CLINICAL DATA:  67 year old male with weakness. EXAM: CHEST - 2 VIEW COMPARISON:  Chest radiograph dated 11/28/2017 FINDINGS: There is no new consolidation, pleural effusion, or pneumothorax. Bibasilar atelectasis noted. There is pleural thickening or a small loculated collection along the right lateral pleural surface, similar to prior radiograph. A 9 mm right upper lobe nodule again noted as seen previously. There is cardiomegaly. No acute osseous pathology. IMPRESSION: 1. No acute cardiopulmonary process. 2. Cardiomegaly. 3. Small loculated pleural effusion versus pleural thickening along the right lateral pleural surface. 4. A 9 mm right upper lobe nodule as seen previously. Chest CT may provide better evaluation on a nonemergent basis. Electronically Signed   By: Anner Crete M.D.   On: 01/12/2018 21:13   Ct  Head Wo Contrast  Result Date: 01/12/2018 CLINICAL DATA:  Altered level of consciousness EXAM: CT HEAD WITHOUT CONTRAST TECHNIQUE: Contiguous axial images were obtained from the base of the skull through the vertex without intravenous contrast. COMPARISON:  MRI 11/29/2017 FINDINGS: Brain: Old right frontoparietal infarct, stable. There is atrophy and chronic small vessel disease changes. No acute intracranial abnormality. Specifically, no hemorrhage, hydrocephalus, mass lesion, acute infarction, or significant intracranial injury. Vascular: No hyperdense vessel or unexpected calcification. Skull: No acute calvarial abnormality. Sinuses/Orbits: No acute finding. Postoperative changes on the  medial left orbital wall and orbital floor with mesh, stable. Other: None IMPRESSION: Old right frontal/parietal infarcts. No acute intracranial abnormality. Atrophy, chronic microvascular disease. Electronically Signed   By: Rolm Baptise M.D.   On: 01/12/2018 20:39   Mr Brain Wo Contrast  Result Date: 01/13/2018 CLINICAL DATA:  LEFT-sided weakness after fall. History of stroke, alcohol abuse, hypertension, hyperlipidemia and diabetes. EXAM: MRI HEAD WITHOUT CONTRAST TECHNIQUE: Multiplanar, multiecho pulse sequences of the brain and surrounding structures were obtained without intravenous contrast. COMPARISON:  CT HEAD January 12, 2018 and MRI of the head November 29, 2017 FINDINGS: Mild motion degraded examination. INTRACRANIAL CONTENTS: Patchy reduced diffusion RIGHT frontal lobe towards the convexity with low ADC values. RIGHT frontoparietal encephalomalacia. Scattered chronic microhemorrhages and RIGHT superficial siderosis. Mild ex vacuo dilatation RIGHT lateral ventricle. Mild parenchymal brain volume loss. No hydrocephalus. Linear T2 bright signal RIGHT cerebral peduncle seen with wallerian degeneration. Patchy supratentorial and pontine white matter FLAIR T2 hyperintensities. No midline shift, mass effect or masses. VASCULAR: Similar loss of RIGHT internal carotid artery flow void to level of the carotid terminus. SKULL AND UPPER CERVICAL SPINE: No abnormal sellar expansion. No suspicious calvarial bone marrow signal. Craniocervical junction maintained. SINUSES/ORBITS: Minimal fluid signal RIGHT petrous apex. Chronic LEFT maxillary sinusitis with atresia. Mild paranasal sinus mucosal thickening. The included ocular globes and orbital contents are non-suspicious. Status post bilateral ocular lens implants. OTHER: Patient is edentulous. IMPRESSION: 1. Mild motion degraded examination. Multifocal acute small RIGHT frontal/MCA territory infarcts. 2. Old RIGHT MCA territory infarct.  Chronically occluded RIGHT  ICA. 3. Similar parenchymal brain volume loss and mild-to-moderate chronic small vessel ischemic changes. 4. Stable chronic microhemorrhages and RIGHT superficial siderosis. Electronically Signed   By: Elon Alas M.D.   On: 01/13/2018 01:43     Transthoracic Echocardiogram - pending  PHYSICAL EXAM Blood pressure 135/65, pulse 61, temperature 97.7 F (36.5 C), temperature source Oral, resp. rate 20, SpO2 94 %. General: Appears well-developed . Psych: Affect appropriate to situation Eyes: No scleral injection HENT: No OP obstrucion Head: Normocephalic.  Cardiovascular: Normal rate and regular rhythm.  Respiratory: Effort normal and breath sounds normal to anterior ascultation GI: Soft. No distension. There is no tenderness.  Skin: WDI   Neurological Examination Mental Status: Alert, oriented, thought content appropriate.  Speech severely dysarthric but fluent without aphasia.  Able to follow commands without difficulty. Cranial Nerves: II: Visual fields: Impaired temporal quadrant visual fields in the left eye. III,IV, VI: ptosis not present, extra-ocular motions intact bilaterally, pupils equal, round, reactive to light and accommodation V,VII: Left facial droop, reduced sensation on the left side VIII: hearing normal bilaterally IX,X: uvula rises symmetrically XI: bilateral shoulder shrug XII: midline tongue extension Motor: Right :  Upper extremity   5/5  Left:     Upper extremity   1/5             Lower extremity   5/5                                                  Lower extremity   4-/5 Tone and bulk: Increased tone in the left upper and lower extremity Sensory: Pinprick and light touch intact throughout, bilaterally Deep Tendon Reflexes: 3+ reflex over left patella, 2+ over the right patella Plantars: Right: downgoing                                Left: downgoing Cerebellar: normal finger-to-nose and normal heel-to-shin test  over right upper extremity Gait: Unable to assess  HOME MEDICATIONS:  Medications Prior to Admission  Medication Sig Dispense Refill  . ACCU-CHEK SOFTCLIX LANCETS lancets Use 3 times daily before meals 100 each 5  . albuterol (PROAIR HFA) 108 (90 Base) MCG/ACT inhaler INHALE 2 PUFFS EVERY 6 HOURS AS NEEDED FOR WHEEZING OR SHORTNESS OF BREATH (Patient taking differently: Inhale 2 puffs into the lungs every 6 (six) hours as needed for wheezing or shortness of breath. ) 8.5 g 2  . allopurinol (ZYLOPRIM) 300 MG tablet Take 1 tablet (300 mg total) by mouth daily. 30 tablet 3  . aspirin 325 MG tablet Take 1 tablet (325 mg total) by mouth daily. (Patient taking differently: Take 81 mg by mouth daily. )    . atorvastatin (LIPITOR) 80 MG tablet Take 1 tablet (80 mg total) by mouth daily at 6 PM. 30 tablet 3  . Blood Glucose Monitoring Suppl (ACCU-CHEK AVIVA) device Use 3 times daily before meals. 1 each 0  . carvedilol (COREG) 12.5 MG tablet Take 1 tablet (12.5 mg total) by mouth 2 (two) times daily with a meal. 60 tablet 3  . clopidogrel (PLAVIX) 75 MG tablet Take 1 tablet (75 mg total) by mouth daily. 30 tablet 3  . DULoxetine (CYMBALTA) 60 MG capsule Take 1 capsule (60 mg total) by mouth daily. 30 capsule 3  . Fluticasone-Salmeterol (ADVAIR DISKUS) 250-50 MCG/DOSE AEPB Inhale 1 puff into the lungs 2 (two) times daily. 1 each 3  . furosemide (LASIX) 20 MG tablet Take 2 tablets (40 mg total) by mouth 2 (two) times daily. 360 tablet 0  . glucose blood (ACCU-CHEK AVIVA) test strip Use 3 times daily before meals as directed. 100 each 12  . hydrocortisone cream 1 % Apply 1 application topically 2 (two) times daily as needed for itching.     . hydrOXYzine (ATARAX/VISTARIL) 25 MG tablet Take 25 mg by mouth every 6 (six) hours as needed for anxiety or itching.     . lactulose (CHRONULAC) 10 GM/15ML solution Take 15 mLs (10 g total) by mouth 2 (two) times daily as needed for mild constipation. 236 mL 1  .  Lancet Devices (ACCU-CHEK SOFTCLIX) lancets Use 3 times daily before meals 1 each 5  . levETIRAcetam (KEPPRA) 500 MG tablet Take 1 tablet (500 mg total) by mouth 2 (two) times daily. 60 tablet 3  . memantine (NAMENDA) 10 MG tablet Take 1 tablet (10 mg total) by mouth 2 (two) times daily. 60 tablet 3  . naphazoline-pheniramine (NAPHCON-A) 0.025-0.3 % ophthalmic solution Place 1 drop into  the left eye 4 (four) times daily as needed for eye irritation.  0  . pantoprazole (PROTONIX) 40 MG tablet Take 1 tablet (40 mg total) by mouth daily. 30 tablet 3  . pregabalin (LYRICA) 150 MG capsule Take 1 capsule (150 mg total) by mouth 2 (two) times daily. 180 capsule 0  . rOPINIRole (REQUIP) 0.5 MG tablet Take 1 tablet (0.5 mg total) by mouth at bedtime. 30 tablet 3  . tiZANidine (ZANAFLEX) 4 MG tablet Take 1 tablet (4 mg total) by mouth every 8 (eight) hours as needed for muscle spasms. 90 tablet 3  . traZODone (DESYREL) 150 MG tablet Take 1 tablet (150 mg total) by mouth at bedtime as needed for sleep. (Patient not taking: Reported on 11/28/2017) 30 tablet 3      HOSPITAL MEDICATIONS:  . aspirin  300 mg Rectal Daily   Or  . aspirin  325 mg Oral Daily  . atorvastatin  80 mg Oral q1800  . clopidogrel  75 mg Oral Daily  . enoxaparin (LOVENOX) injection  40 mg Subcutaneous Q24H  . folic acid  1 mg Oral Daily  . levETIRAcetam  500 mg Oral BID  . multivitamin with minerals  1 tablet Oral Daily  . nicotine  21 mg Transdermal Daily  . thiamine  100 mg Oral Daily   Or  . thiamine  100 mg Intravenous Daily    ALLERGIES Allergies  Allergen Reactions  . Darvocet [Propoxyphene N-Acetaminophen] Hives  . Haldol [Haloperidol Decanoate] Hives  . Acetaminophen Nausea Only    Upset stomach, tolerates Hydrocodone/APAP if taken with food  . Metformin And Related Nausea And Vomiting  . Norco [Hydrocodone-Acetaminophen] Nausea And Vomiting    Tolerates if taken with food   ASSESSMENT/PLAN  67 y.o. male past  medical history of hyperlipidemia, diabetes mellitus, coronary artery disease, alcohol abuse, congestive heart failure, COPD,CVA with left-sided weakness, chronically occluded right ICA  presents to the emergency department for 2 to 3-day history of worsening left-sided weakness and left facial numbness. Pt admits to cocaine use (UDS +).  MRI brain shows cortical infarcts in the right MCA territory concerning for stump emboli rather than watershed infarcts. Cerebral Angio planned with IR to assess if right carotid is completed occluded versus severely stenosed.   Right MCA embolic infarctions  Chronic R ICA occlusion ( vs severe stenosis) Etiology: Stump embolic vs Watershed stroke, less likely cardioembolic     MRI head -new infarcts seen in RMCA  Cerebral Angio pending  2D Echo - pending  LDL - 98, TG 167  HgbA1c - 5.4  UDS + cocaine  VTE prophylaxis - lovenox  Diet per SLP  ASA, Plavix prior to admission, now on same  Patient counseled to be compliant with his antithrombotic medications; he admits to poor medication compliance  Ongoing aggressive stroke risk factor management  Therapy recommendations:  pending  Disposition:  Pending  Hypertension  Stable . Permissive hypertension (OK if < 220/120) but gradually normalize in 5-7 days . Long-term BP goal normotensive  Hyperlipidemia  Lipid lowering medication PTA:  80mg  Lipitor PO QD  LDL 98, goal < 70  Current lipid lowering medication:same  Continue statin at discharge  Other Stroke Risk Factors  Cigarette smoker; advised to stop smoking  Cocaine abuse; advised to stop all illicit drugs  ETOH use, advised to stop drinking  Obesity, recommend weight loss, diet and exercise as appropriate   Hx stroke/TIA  Other Active Problems  ETOH and cocaine abuse  Chronic  RICA occlusion  PLAN: D/w IR team and plan for cerebral angiogram in am Echo pending Permissive HTN; avoid aggressive lowering   Started stroke education Rehab as tol Secondary prevention: enc med compliance with home DAPT + statin  Hospital day # 1  ATTENDING NOTE: I reviewed above note and agree with the assessment and plan. Pt was seen and examined.   67 year old male with history of hypertension, hyperlipidemia, diabetes, CAD, alcohol abuse, CHF, COPD, and multiple strokes.  History of stroke:  August 01, 2014  right frontal lobe watershed infarcts with hypotension in setting of R paraclinoid ICA stenosis   August 19, 2014  right posterior frontal infarct secondary to hypotension in setting of severe R paraclinoid stenosis  August 26, 2014  right corpus callosum infarcts in setting of R frontal infarcts in June, and new right frontal at early July, felt to be watershed in setting of hypotension due to R ICA supraclinoid near occlusion, though could not rule out embolic source.  02/2015, admitted for left-sided weakness, MRI showed scattered right MCA, MCA/ACA watershed infarcts, again felt to be secondary to right ICA supraclinoid near occlusion.  IR placed right supraclinoid ICA stent and proximal ICA stent.  Continue on aspirin 325 and Plavix 75 and Lipitor 80.  Patient discharged to CIR.  07/2016, patient presented with again right MCA patchy multifocal infarcts, MRA showed right supraclinoid ICA stent near occlusion with distal reconstitution.  However, CTA head and neck showed both right supraclinoid ICA and proximal ICA stent were patent but with taper of distal proximal ICA stent and suspected to be hemodynamically significant. Carotid Doppler showed right ICA proximal stent patent.  DSA showed 50% in-stent stenosis right ICA cavernous segment, and only 20% stenosis distal portion of proximal right ICA stent.  LDL 95 and A1c 5.8.  He was continued on aspirin and Plavix.  10/2016 follow with Dr. Leonie Man at [facility], discontinue aspirin, continue on Plavix alone.  10/2017, admitted again for right MCA watershed infarct.   CTA head neck showed occluded right proximal ICA stent, with grossly patent flow within the right cavernous ICA stent.  LDL 97 A1c 5.9.  Patient was discharged on aspirin 325 and Plavix 75.  11/2017, patient admitted for unresponsive episode.  EEG negative.  MRI negative for acute stroke.  He was put on Keppra 500 twice daily on discharge.  This time, patient was admitted again for worsening left-sided weakness and left facial numbness causing a fall.  BP on admission 118/69, after IV fluid BP up to 151/66.  MRI showed right MCA, MCA/ACA, and ACA infarcts.  LDL 98 and A1c 5.4.  However UDS showed positive cocaine.  Patient stated that he used cocaine once a while.  He is still smoking and admitted that not compliant with medication.  Dr. Estanislado Pandy made aware and will do DSA in am.   On exam patient has moderate to severe dysarthria, left facial droop left upper extremity 0/5 proximal and 3-/5 distal, left lower extremity 4/5. Patient stroke most likely due to right ICA occlusion in the setting of relative hypotension, cocaine use, medication noncompliance and continued smoking.  Currently continue on aspirin 325, Plavix, Lipitor and Keppra.  Educated on medication compliance, stroke risk factor modification and cocaine cessation.  PT/OT pending.  Will follow.  Rosalin Hawking, MD PhD Stroke Neurology 01/13/2018 5:30 PM   To contact Stroke Continuity provider, please refer to [email or web address] After hours, contact General Neurology

## 2018-01-13 NOTE — ED Provider Notes (Signed)
Care assumed from Dr. Darl Householder, patient with subacute weakness, pending MRI of brain to look for stroke.  MRI shows multiple small acute right frontal and MCA territory infarcts.  Case is discussed with Dr. Tamala Julian of [facility] hospitalist who agrees to admit the patient.  Case is discussed with Dr. Lorraine Lax of neurology service who agrees to see the patient in consultation.  Results for orders placed or performed during the hospital encounter of 01/12/18  Ethanol  Result Value Ref Range   Alcohol, Ethyl (B) <10 <10 mg/dL  Protime-INR  Result Value Ref Range   Prothrombin Time 13.2 11.4 - 15.2 seconds   INR 1.01   APTT  Result Value Ref Range   aPTT 29 24 - 36 seconds  CBC  Result Value Ref Range   WBC 6.7 4.0 - 10.5 K/uL   RBC 4.98 4.22 - 5.81 MIL/uL   Hemoglobin 13.4 13.0 - 17.0 g/dL   HCT 45.1 39.0 - 52.0 %   MCV 90.6 80.0 - 100.0 fL   MCH 26.9 26.0 - 34.0 pg   MCHC 29.7 (L) 30.0 - 36.0 g/dL   RDW 14.1 11.5 - 15.5 %   Platelets 220 150 - 400 K/uL   nRBC 0.0 0.0 - 0.2 %  Differential  Result Value Ref Range   Neutrophils Relative % 66 %   Neutro Abs 4.4 1.7 - 7.7 K/uL   Lymphocytes Relative 20 %   Lymphs Abs 1.4 0.7 - 4.0 K/uL   Monocytes Relative 8 %   Monocytes Absolute 0.6 0.1 - 1.0 K/uL   Eosinophils Relative 4 %   Eosinophils Absolute 0.2 0.0 - 0.5 K/uL   Basophils Relative 1 %   Basophils Absolute 0.0 0.0 - 0.1 K/uL   Immature Granulocytes 1 %   Abs Immature Granulocytes 0.03 0.00 - 0.07 K/uL  Comprehensive metabolic panel  Result Value Ref Range   Sodium 144 135 - 145 mmol/L   Potassium 3.7 3.5 - 5.1 mmol/L   Chloride 107 98 - 111 mmol/L   CO2 33 (H) 22 - 32 mmol/L   Glucose, Bld 121 (H) 70 - 99 mg/dL   BUN 13 8 - 23 mg/dL   Creatinine, Ser 1.08 0.61 - 1.24 mg/dL   Calcium 9.6 8.9 - 10.3 mg/dL   Total Protein 6.5 6.5 - 8.1 g/dL   Albumin 3.6 3.5 - 5.0 g/dL   AST 14 (L) 15 - 41 U/L   ALT 13 0 - 44 U/L   Alkaline Phosphatase 78 38 - 126 U/L   Total Bilirubin 0.6 0.3 -  1.2 mg/dL   GFR calc non Af Amer >60 >60 mL/min   GFR calc Af Amer >60 >60 mL/min   Anion gap 4 (L) 5 - 15  Urine rapid drug screen (hosp performed)  Result Value Ref Range   Opiates NONE DETECTED NONE DETECTED   Cocaine POSITIVE (A) NONE DETECTED   Benzodiazepines NONE DETECTED NONE DETECTED   Amphetamines NONE DETECTED NONE DETECTED   Tetrahydrocannabinol NONE DETECTED NONE DETECTED   Barbiturates NONE DETECTED NONE DETECTED  Urinalysis, Routine w reflex microscopic  Result Value Ref Range   Color, Urine YELLOW YELLOW   APPearance CLEAR CLEAR   Specific Gravity, Urine 1.009 1.005 - 1.030   pH 6.0 5.0 - 8.0   Glucose, UA NEGATIVE NEGATIVE mg/dL   Hgb urine dipstick NEGATIVE NEGATIVE   Bilirubin Urine NEGATIVE NEGATIVE   Ketones, ur NEGATIVE NEGATIVE mg/dL   Protein, ur NEGATIVE NEGATIVE mg/dL  Nitrite NEGATIVE NEGATIVE   Leukocytes, UA NEGATIVE NEGATIVE  I-Stat Chem 8, ED  Result Value Ref Range   Sodium 144 135 - 145 mmol/L   Potassium 4.1 3.5 - 5.1 mmol/L   Chloride 108 98 - 111 mmol/L   BUN 18 8 - 23 mg/dL   Creatinine, Ser 1.00 0.61 - 1.24 mg/dL   Glucose, Bld 115 (H) 70 - 99 mg/dL   Calcium, Ion 1.22 1.15 - 1.40 mmol/L   TCO2 32 22 - 32 mmol/L   Hemoglobin 15.3 13.0 - 17.0 g/dL   HCT 45.0 39.0 - 52.0 %  I-stat troponin, ED  Result Value Ref Range   Troponin i, poc 0.01 0.00 - 0.08 ng/mL   Comment 3           Dg Chest 2 View  Result Date: 01/12/2018 CLINICAL DATA:  67 year old male with weakness. EXAM: CHEST - 2 VIEW COMPARISON:  Chest radiograph dated 11/28/2017 FINDINGS: There is no new consolidation, pleural effusion, or pneumothorax. Bibasilar atelectasis noted. There is pleural thickening or a small loculated collection along the right lateral pleural surface, similar to prior radiograph. A 9 mm right upper lobe nodule again noted as seen previously. There is cardiomegaly. No acute osseous pathology. IMPRESSION: 1. No acute cardiopulmonary process. 2.  Cardiomegaly. 3. Small loculated pleural effusion versus pleural thickening along the right lateral pleural surface. 4. A 9 mm right upper lobe nodule as seen previously. Chest CT may provide better evaluation on a nonemergent basis. Electronically Signed   By: Anner Crete M.D.   On: 01/12/2018 21:13   Ct Head Wo Contrast  Result Date: 01/12/2018 CLINICAL DATA:  Altered level of consciousness EXAM: CT HEAD WITHOUT CONTRAST TECHNIQUE: Contiguous axial images were obtained from the base of the skull through the vertex without intravenous contrast. COMPARISON:  MRI 11/29/2017 FINDINGS: Brain: Old right frontoparietal infarct, stable. There is atrophy and chronic small vessel disease changes. No acute intracranial abnormality. Specifically, no hemorrhage, hydrocephalus, mass lesion, acute infarction, or significant intracranial injury. Vascular: No hyperdense vessel or unexpected calcification. Skull: No acute calvarial abnormality. Sinuses/Orbits: No acute finding. Postoperative changes on the medial left orbital wall and orbital floor with mesh, stable. Other: None IMPRESSION: Old right frontal/parietal infarcts. No acute intracranial abnormality. Atrophy, chronic microvascular disease. Electronically Signed   By: Rolm Baptise M.D.   On: 01/12/2018 20:39   Mr Brain Wo Contrast  Result Date: 01/13/2018 CLINICAL DATA:  LEFT-sided weakness after fall. History of stroke, alcohol abuse, hypertension, hyperlipidemia and diabetes. EXAM: MRI HEAD WITHOUT CONTRAST TECHNIQUE: Multiplanar, multiecho pulse sequences of the brain and surrounding structures were obtained without intravenous contrast. COMPARISON:  CT HEAD January 12, 2018 and MRI of the head November 29, 2017 FINDINGS: Mild motion degraded examination. INTRACRANIAL CONTENTS: Patchy reduced diffusion RIGHT frontal lobe towards the convexity with low ADC values. RIGHT frontoparietal encephalomalacia. Scattered chronic microhemorrhages and RIGHT superficial  siderosis. Mild ex vacuo dilatation RIGHT lateral ventricle. Mild parenchymal brain volume loss. No hydrocephalus. Linear T2 bright signal RIGHT cerebral peduncle seen with wallerian degeneration. Patchy supratentorial and pontine white matter FLAIR T2 hyperintensities. No midline shift, mass effect or masses. VASCULAR: Similar loss of RIGHT internal carotid artery flow void to level of the carotid terminus. SKULL AND UPPER CERVICAL SPINE: No abnormal sellar expansion. No suspicious calvarial bone marrow signal. Craniocervical junction maintained. SINUSES/ORBITS: Minimal fluid signal RIGHT petrous apex. Chronic LEFT maxillary sinusitis with atresia. Mild paranasal sinus mucosal thickening. The included ocular globes and orbital contents are  non-suspicious. Status post bilateral ocular lens implants. OTHER: Patient is edentulous. IMPRESSION: 1. Mild motion degraded examination. Multifocal acute small RIGHT frontal/MCA territory infarcts. 2. Old RIGHT MCA territory infarct.  Chronically occluded RIGHT ICA. 3. Similar parenchymal brain volume loss and mild-to-moderate chronic small vessel ischemic changes. 4. Stable chronic microhemorrhages and RIGHT superficial siderosis. Electronically Signed   By: Elon Alas M.D.   On: 46/05/7996 72:15      Delora Fuel, MD 87/27/61 [phone]

## 2018-01-13 NOTE — Consult Note (Signed)
Requesting Physician: Dr. Roxanne Mins    Chief Complaint: Worsening left side weakness  History obtained from: Patient and Chart     HPI:                                                                                                                                       [name] is an 67 y.o. male past medical history of hyperlipidemia, diabetes mellitus, coronary artery disease, alcohol abuse, congestive heart failure, COPD,CVA with left-sided weakness, chronically occluded right ICA  presents to the emergency department for 2 to 3-day history of worsening left-sided weakness and left facial numbness.  Patient is a poor historian.  Per ED note patient has been having 2 to 3 days of worsening weakness, however patient states that started last evening around 6 PM.  Patient has residual left-sided weakness since his last stroke and normally walks with a cane.  However yesterday patient fell on the floor and could not get up and called EMS.  He underwent MRI brain in the emergency department showed acute multiple infarcts in the right MCA territory in addition to old chronic right MCA infarcts.  He has known chronically occluded right ICA.   Date last known well: 12.4.19 tPA Given: Outside TPA window NIHSS: 11 Baseline MRS 3     Past Medical History:  Diagnosis Date  . Alcohol abuse    H/O  . Anxiety   . Arthritis   . Asthma   . CAD (coronary artery disease) 05/27/10   Cath: severe single vessell CAD left cx midportion obtuse marginal 2 to 3.  [name] CHF (congestive heart failure) ([place])   . COPD (chronic obstructive pulmonary disease) ([place])   . COPD (chronic obstructive pulmonary disease) ([place])   . Depression   . Diabetes mellitus, type 2 ([place]) 03/13/2015  . GERD (gastroesophageal reflux disease)   . HCAP (healthcare-associated pneumonia) 10/15/2015  . History of DVT of lower extremity   . Hyperlipemia   . Hyperlipidemia   . Hypertension   . Myocardial infarction ([place]) 2000  .  Orbital fracture 12/2012  . OSA on CPAP   . Peripheral vascular disease, unspecified ([place])    08/20/10 doppler: increase in right ABI post-op. Left ABI stable. S/P bi-fem bypass surgery  . Pneumonia 04/04/2012  . Shortness of breath   . Stroke ([place])   . Tobacco abuse     Past Surgical History:  Procedure Laterality Date  . abdoninal ao angio & bifem angio  05/27/10   Patent graft, occluded bil stents with no retrograde flow into the hypogastric arteries. 100% occl left ant. tibial artery, 70% to 80% to 100% stenosis right superficial fem artery above adductor canal. 100 % occl right ant. tibial vessell  . Aortogram w/ PTCA  09/292003  . BACK SURGERY    . CARDIAC CATHETERIZATION  05/27/10   severe CAD left cx  . CHOLECYSTECTOMY    .  ESOPHAGOGASTRIC FUNDOPLICATION    . EYE SURGERY    . FEMORAL BYPASS  08/19/10   Right Fem-Pop  . IR ANGIO INTRA EXTRACRAN SEL COM CAROTID INNOMINATE BILAT MOD SED  07/20/2016  . IR ANGIO VERTEBRAL SEL SUBCLAVIAN INNOMINATE UNI R MOD SED  07/20/2016  . IR ANGIO VERTEBRAL SEL VERTEBRAL UNI L MOD SED  07/20/2016  . IR RADIOLOGIST EVAL & MGMT  05/27/2016  . JOINT REPLACEMENT    . PERIPHERAL VASCULAR CATHETERIZATION N/A 12/10/2014   Procedure: Abdominal Aortogram;  Surgeon: Angelia Mould, MD;  Location: [facility];  Service: Cardiovascular;  Laterality: N/A;  . RADIOLOGY WITH ANESTHESIA N/A 02/08/2015   Procedure: RADIOLOGY WITH ANESTHESIA;  Surgeon: Luanne Bras, MD;  Location: [facility];  Service: Radiology;  Laterality: N/A;  . TEE WITHOUT CARDIOVERSION N/A 08/29/2014   Procedure: TRANSESOPHAGEAL ECHOCARDIOGRAM (TEE);  Surgeon: Josue Hector, MD;  Location: [facility];  Service: Cardiovascular;  Laterality: N/A;  . TOTAL KNEE ARTHROPLASTY      Family History  Problem Relation Age of Onset  . Heart attack Mother   . Heart failure Mother   . Cirrhosis Father   . Heart failure Brother   . Cancer Brother   . Hypertension Neg Hx         UNKNOWN  . Stroke Neg Hx        UNKNOWN   Social History:  reports that he has been smoking cigars and cigarettes. He started smoking about 56 years ago. He has a 25.00 pack-year smoking history. He has quit using smokeless tobacco.  His smokeless tobacco use included chew. He reports that he drinks alcohol. He reports that he has current or past drug history. Drug: Cocaine.  Allergies:  Allergies  Allergen Reactions  . Darvocet [Propoxyphene N-Acetaminophen] Hives  . Haldol [Haloperidol Decanoate] Hives  . Acetaminophen Nausea Only    Upset stomach, tolerates Hydrocodone/APAP if taken with food  . Metformin And Related Nausea And Vomiting  . Norco [Hydrocodone-Acetaminophen] Nausea And Vomiting    Tolerates if taken with food    Medications:                                                                                                                        I reviewed home medications   ROS:  14 systems reviewed and negative except above    Examination:                                                                                                      General: Appears well-developed . Psych: Affect appropriate to situation Eyes: No scleral injection HENT: No OP obstrucion Head: Normocephalic.  Cardiovascular: Normal rate and regular rhythm.  Respiratory: Effort normal and breath sounds normal to anterior ascultation GI: Soft.  No distension. There is no tenderness.  Skin: WDI    Neurological Examination Mental Status: Alert, oriented, thought content appropriate.  Speech severely dysarthric but fluent without aphasia.  Able to follow commands without difficulty. Cranial Nerves: II: Visual fields: Impaired temporal quadrant visual fields in the left eye. III,IV, VI: ptosis not present, extra-ocular motions intact bilaterally, pupils equal,  round, reactive to light and accommodation V,VII: Left facial droop, reduced sensation on the left side VIII: hearing normal bilaterally IX,X: uvula rises symmetrically XI: bilateral shoulder shrug XII: midline tongue extension Motor: Right : Upper extremity   5/5    Left:     Upper extremity   2/5 (3/5 at the elbow flexion and extension)  Lower extremity   5/5     Lower extremity   3-/5 Tone and bulk: Increased tone in the left upper and lower extremity Sensory: Pinprick and light touch intact throughout, bilaterally Deep Tendon Reflexes: 3+ reflex over left patella, 2+ over the right patella Plantars: Right: downgoing   Left: downgoing Cerebellar: normal finger-to-nose and normal heel-to-shin test over right upper extremity Gait: Unable to assess     Lab Results: Basic Metabolic Panel: Recent Labs  Lab 01/12/18 2111 01/12/18 2125  NA 144 144  K 3.7 4.1  CL 107 108  CO2 33*  --   GLUCOSE 121* 115*  BUN 13 18  CREATININE 1.08 1.00  CALCIUM 9.6  --     CBC: Recent Labs  Lab 01/12/18 2111 01/12/18 2125  WBC 6.7  --   NEUTROABS 4.4  --   HGB 13.4 15.3  HCT 45.1 45.0  MCV 90.6  --   PLT 220  --     Coagulation Studies: Recent Labs    01/12/18 2111  LABPROT 13.2  INR 1.01    Imaging: Dg Chest 2 View  Result Date: 01/12/2018 CLINICAL DATA:  67 year old male with weakness. EXAM: CHEST - 2 VIEW COMPARISON:  Chest radiograph dated 11/28/2017 FINDINGS: There is no new consolidation, pleural effusion, or pneumothorax. Bibasilar atelectasis noted. There is pleural thickening or a small loculated collection along the right lateral pleural surface, similar to prior radiograph. A 9 mm right upper lobe nodule again noted as seen previously. There is cardiomegaly. No acute osseous pathology. IMPRESSION: 1. No acute cardiopulmonary process. 2. Cardiomegaly. 3. Small loculated pleural effusion versus pleural thickening along the right lateral pleural surface. 4. A 9 mm right  upper lobe nodule as seen previously. Chest CT may provide better evaluation on a nonemergent basis. Electronically Signed   By: Anner Crete M.D.   On: 01/12/2018 21:13  Ct Head Wo Contrast  Result Date: 01/12/2018 CLINICAL DATA:  Altered level of consciousness EXAM: CT HEAD WITHOUT CONTRAST TECHNIQUE: Contiguous axial images were obtained from the base of the skull through the vertex without intravenous contrast. COMPARISON:  MRI 11/29/2017 FINDINGS: Brain: Old right frontoparietal infarct, stable. There is atrophy and chronic small vessel disease changes. No acute intracranial abnormality. Specifically, no hemorrhage, hydrocephalus, mass lesion, acute infarction, or significant intracranial injury. Vascular: No hyperdense vessel or unexpected calcification. Skull: No acute calvarial abnormality. Sinuses/Orbits: No acute finding. Postoperative changes on the medial left orbital wall and orbital floor with mesh, stable. Other: None IMPRESSION: Old right frontal/parietal infarcts. No acute intracranial abnormality. Atrophy, chronic microvascular disease. Electronically Signed   By: Rolm Baptise M.D.   On: 01/12/2018 20:39   Mr Brain Wo Contrast  Result Date: 01/13/2018 CLINICAL DATA:  LEFT-sided weakness after fall. History of stroke, alcohol abuse, hypertension, hyperlipidemia and diabetes. EXAM: MRI HEAD WITHOUT CONTRAST TECHNIQUE: Multiplanar, multiecho pulse sequences of the brain and surrounding structures were obtained without intravenous contrast. COMPARISON:  CT HEAD January 12, 2018 and MRI of the head November 29, 2017 FINDINGS: Mild motion degraded examination. INTRACRANIAL CONTENTS: Patchy reduced diffusion RIGHT frontal lobe towards the convexity with low ADC values. RIGHT frontoparietal encephalomalacia. Scattered chronic microhemorrhages and RIGHT superficial siderosis. Mild ex vacuo dilatation RIGHT lateral ventricle. Mild parenchymal brain volume loss. No hydrocephalus. Linear T2 bright  signal RIGHT cerebral peduncle seen with wallerian degeneration. Patchy supratentorial and pontine white matter FLAIR T2 hyperintensities. No midline shift, mass effect or masses. VASCULAR: Similar loss of RIGHT internal carotid artery flow void to level of the carotid terminus. SKULL AND UPPER CERVICAL SPINE: No abnormal sellar expansion. No suspicious calvarial bone marrow signal. Craniocervical junction maintained. SINUSES/ORBITS: Minimal fluid signal RIGHT petrous apex. Chronic LEFT maxillary sinusitis with atresia. Mild paranasal sinus mucosal thickening. The included ocular globes and orbital contents are non-suspicious. Status post bilateral ocular lens implants. OTHER: Patient is edentulous. IMPRESSION: 1. Mild motion degraded examination. Multifocal acute small RIGHT frontal/MCA territory infarcts. 2. Old RIGHT MCA territory infarct.  Chronically occluded RIGHT ICA. 3. Similar parenchymal brain volume loss and mild-to-moderate chronic small vessel ischemic changes. 4. Stable chronic microhemorrhages and RIGHT superficial siderosis. Electronically Signed   By: Elon Alas M.D.   On: 01/13/2018 01:43     ASSESSMENT AND PLAN  67 y.o. male past medical history of hyperlipidemia, diabetes mellitus, coronary artery disease, alcohol abuse, congestive heart failure, COPD,CVA with left-sided weakness, chronically occluded right ICA  presents to the emergency department for 2 to 3-day history of worsening left-sided weakness and left facial numbness.  MRI brain shows cortical infarcts in the right MCA territory concerning for stump emboli rather than watershed infarcts.  I do think it is reasonable to get DSA by IR to assess if right carotid is completed occluded versus severely stenosed.   Right MCA embolic infarctions  Chronic R ICA occlusion ( vs severe stenosis)   Etiology: Stump embolic vs Watershed stroke, less likely cardioembolic    Recommendations    1.Consider diagnostic catheter  angiogram by Neuro IR to assess if R carotid is completely occluded vs severely stenosed. If not completed occluded will need carotid stent.  #Transthoracic Echo  # Continue ASA 325mg  daily #Continue Atorvastatin 40 mg/other high intensity statin # BP goal: permissive HTN upto 185/110 mmHg # HBAIC and Lipid profile # Telemetry monitoring # Frequent neuro checks # Stroke swallow screen  Please page stroke NP  Or  PA  Or MD from 8am -4 pm  as this patient from this time will be  followed by the stroke.   You can look them up on [email or web address]  Password [facility]    Tahara Ruffini [facility] Pager Number [phone]

## 2018-01-13 NOTE — Progress Notes (Signed)
Rehab Admissions Coordinator Note:  Patient was screened by Cleatrice Burke for appropriateness for an Inpatient Acute Rehab Consult per PT recommendation. Noted patient recently admitted to [facility] and was discharged to [facility]. Has been home for two weeks. Lives at [facility] senior apartments with assist from aide. I ask therapy if pt felt he can reach Mod I level to d/c home alone after a short inpt rehab admit ?[name] If not, feel return to SNF most appropriated rehab venue. Please advise.  Danne Baxter, RN, MSN Rehab Admissions Coordinator [phone] 01/13/2018 6:38 PM

## 2018-01-13 NOTE — ED Notes (Signed)
Dr. Tamala Julian paged for request of diet order for patient r/t pt passing stroke swallow screen

## 2018-01-13 NOTE — H&P (Signed)
Chief Complaint: Occluded carotid artery  Referring Physician(s): Dhungel, Nishant  Supervising Physician: Luanne Bras  Patient Status: [facility] - In-pt  History of Present Illness: [name] is a 67 y.o. male with a medical history include coronary artery disease, chronic diastolic CHF, PVD, OSA, hypertension, diet-controlled diabetes mellitus, COPD with active tobacco use.   He tells me he had increasing left-sided weakness for past 2 days.  He states fell on the floor landing on the left side and could not get up.  He was also unable to move his left extremities.  EMS brought him to the ED where he underwent CT head which was negative for acute findings.    MRI of the brain showing acute multiple infarcts in the right MCA territory in addition to his chronic right MCA infarct.  He has a history of recurrent hospitalization for embolic stroke.  His last hospitalization was in September with multifocal right-sided infarct suspected due to chronically occluded right ICA stent.    Vitals were stable.  Blood work was unremarkable.  TPA not given as patient was out of the therapeutic window for TPA.  MRI showed Old RIGHT MCA territory infarct.  Chronically occluded RIGHT ICA.  He denies any headache, blurred vision, slurred speech, dizziness, loss of consciousness, chest pain, shortness of breath, palpitations, abdominal pain, dysuria, diarrhea, tingling or numbness of the extremities.    He reports being adherent to his medications (they are provided by home health aide).    He continues to smoke and reports last cocaine use was yesterday.  Reports drinking alcohol infrequently (1-2 drinks, once a week or so).  We are asked to perform angiography to confirm chronic occlusion of the right ICA.  Past Medical History:  Diagnosis Date  . Alcohol abuse    H/O  . Anxiety   . Arthritis   . Asthma   . CAD (coronary artery disease) 05/27/10   Cath: severe single  vessell CAD left cx midportion obtuse marginal 2 to 3.  [name] CHF (congestive heart failure) ([place])   . COPD (chronic obstructive pulmonary disease) ([place])   . COPD (chronic obstructive pulmonary disease) ([place])   . Depression   . Diabetes mellitus, type 2 ([place]) 03/13/2015  . GERD (gastroesophageal reflux disease)   . HCAP (healthcare-associated pneumonia) 10/15/2015  . History of DVT of lower extremity   . Hyperlipemia   . Hyperlipidemia   . Hypertension   . Myocardial infarction ([place]) 2000  . Orbital fracture 12/2012  . OSA on CPAP   . Peripheral vascular disease, unspecified ([place])    08/20/10 doppler: increase in right ABI post-op. Left ABI stable. S/P bi-fem bypass surgery  . Pneumonia 04/04/2012  . Shortness of breath   . Stroke ([place])   . Tobacco abuse     Past Surgical History:  Procedure Laterality Date  . abdoninal ao angio & bifem angio  05/27/10   Patent graft, occluded bil stents with no retrograde flow into the hypogastric arteries. 100% occl left ant. tibial artery, 70% to 80% to 100% stenosis right superficial fem artery above adductor canal. 100 % occl right ant. tibial vessell  . Aortogram w/ PTCA  09/292003  . BACK SURGERY    . CARDIAC CATHETERIZATION  05/27/10   severe CAD left cx  . CHOLECYSTECTOMY    . ESOPHAGOGASTRIC FUNDOPLICATION    . EYE SURGERY    . FEMORAL BYPASS  08/19/10   Right Fem-Pop  . IR ANGIO INTRA EXTRACRAN SEL  COM CAROTID INNOMINATE BILAT MOD SED  07/20/2016  . IR ANGIO VERTEBRAL SEL SUBCLAVIAN INNOMINATE UNI R MOD SED  07/20/2016  . IR ANGIO VERTEBRAL SEL VERTEBRAL UNI L MOD SED  07/20/2016  . IR RADIOLOGIST EVAL & MGMT  05/27/2016  . JOINT REPLACEMENT    . PERIPHERAL VASCULAR CATHETERIZATION N/A 12/10/2014   Procedure: Abdominal Aortogram;  Surgeon: Angelia Mould, MD;  Location: [facility];  Service: Cardiovascular;  Laterality: N/A;  . RADIOLOGY WITH ANESTHESIA N/A 02/08/2015   Procedure: RADIOLOGY WITH ANESTHESIA;  Surgeon: Luanne Bras, MD;  Location: [place];  Service: Radiology;  Laterality: N/A;  . TEE WITHOUT CARDIOVERSION N/A 08/29/2014   Procedure: TRANSESOPHAGEAL ECHOCARDIOGRAM (TEE);  Surgeon: Josue Hector, MD;  Location: [facility] ENDOSCOPY;  Service: Cardiovascular;  Laterality: N/A;  . TOTAL KNEE ARTHROPLASTY      Allergies: Darvocet [propoxyphene n-acetaminophen]; Haldol [haloperidol decanoate]; Acetaminophen; Metformin and related; and Norco [hydrocodone-acetaminophen]  Medications: Prior to Admission medications   Medication Sig Start Date End Date Taking? Authorizing Provider  ACCU-CHEK SOFTCLIX LANCETS lancets Use 3 times daily before meals 09/17/15  Yes Newlin, Enobong, MD  albuterol (PROAIR HFA) 108 (90 Base) MCG/ACT inhaler INHALE 2 PUFFS EVERY 6 HOURS AS NEEDED FOR WHEEZING OR SHORTNESS OF BREATH Patient taking differently: Inhale 2 puffs into the lungs every 6 (six) hours as needed for wheezing or shortness of breath.  10/05/17  Yes Newlin, Enobong, MD  allopurinol (ZYLOPRIM) 300 MG tablet Take 1 tablet (300 mg total) by mouth daily. 01/12/18  Yes Charlott Rakes, MD  aspirin 325 MG tablet Take 1 tablet (325 mg total) by mouth daily. Patient taking differently: Take 81 mg by mouth daily.  10/30/17  Yes Patrecia Pour, MD  atorvastatin (LIPITOR) 80 MG tablet Take 1 tablet (80 mg total) by mouth daily at 6 PM. 01/12/18  Yes Newlin, Enobong, MD  Blood Glucose Monitoring Suppl (ACCU-CHEK AVIVA) device Use 3 times daily before meals. 03/13/15  Yes Charlott Rakes, MD  carvedilol (COREG) 12.5 MG tablet Take 1 tablet (12.5 mg total) by mouth 2 (two) times daily with a meal. 01/12/18  Yes Newlin, Enobong, MD  clopidogrel (PLAVIX) 75 MG tablet Take 1 tablet (75 mg total) by mouth daily. 01/12/18  Yes Charlott Rakes, MD  DULoxetine (CYMBALTA) 60 MG capsule Take 1 capsule (60 mg total) by mouth daily. 01/12/18  Yes Newlin, Charlane Ferretti, MD  Fluticasone-Salmeterol (ADVAIR DISKUS) 250-50 MCG/DOSE AEPB Inhale 1 puff into the lungs  2 (two) times daily. 01/12/18  Yes Charlott Rakes, MD  furosemide (LASIX) 20 MG tablet Take 2 tablets (40 mg total) by mouth 2 (two) times daily. 10/08/17  Yes Newlin, Charlane Ferretti, MD  glucose blood (ACCU-CHEK AVIVA) test strip Use 3 times daily before meals as directed. 01/29/16  Yes Charlott Rakes, MD  hydrocortisone cream 1 % Apply 1 application topically 2 (two) times daily as needed for itching.    Yes [provider]  hydrOXYzine (ATARAX/VISTARIL) 25 MG tablet Take 25 mg by mouth every 6 (six) hours as needed for anxiety or itching.    Yes [provider]  lactulose (CHRONULAC) 10 GM/15ML solution Take 15 mLs (10 g total) by mouth 2 (two) times daily as needed for mild constipation. 08/19/17  Yes Charlott Rakes, MD  Lancet Devices [facility]) lancets Use 3 times daily before meals 09/27/15  Yes Newlin, Enobong, MD  levETIRAcetam (KEPPRA) 500 MG tablet Take 1 tablet (500 mg total) by mouth 2 (two) times daily. 01/12/18  Yes Charlott Rakes, MD  memantine (NAMENDA) 10 MG tablet Take 1 tablet (10 mg total) by mouth 2 (two) times daily. 01/12/18  Yes Newlin, Charlane Ferretti, MD  naphazoline-pheniramine (NAPHCON-A) 0.025-0.3 % ophthalmic solution Place 1 drop into the left eye 4 (four) times daily as needed for eye irritation. 10/29/17  Yes Patrecia Pour, MD  pantoprazole (PROTONIX) 40 MG tablet Take 1 tablet (40 mg total) by mouth daily. 01/12/18  Yes Charlott Rakes, MD  pregabalin (LYRICA) 150 MG capsule Take 1 capsule (150 mg total) by mouth 2 (two) times daily. 01/12/18  Yes Charlott Rakes, MD  rOPINIRole (REQUIP) 0.5 MG tablet Take 1 tablet (0.5 mg total) by mouth at bedtime. 01/12/18  Yes Charlott Rakes, MD  tiZANidine (ZANAFLEX) 4 MG tablet Take 1 tablet (4 mg total) by mouth every 8 (eight) hours as needed for muscle spasms. 01/12/18  Yes Charlott Rakes, MD  traZODone (DESYREL) 150 MG tablet Take 1 tablet (150 mg total) by mouth at bedtime as needed for sleep. Patient not taking:  Reported on 11/28/2017 08/19/17   Charlott Rakes, MD     Family History  Problem Relation Age of Onset  . Heart attack Mother   . Heart failure Mother   . Cirrhosis Father   . Heart failure Brother   . Cancer Brother   . Hypertension Neg Hx        UNKNOWN  . Stroke Neg Hx        UNKNOWN    Social History   Socioeconomic History  . Marital status: Divorced    Spouse name: Not on file  . Number of children: Not on file  . Years of education: Not on file  . Highest education level: Not on file  Occupational History  . Not on file  Social Needs  . Financial resource strain: Not on file  . Food insecurity:    Worry: Not on file    Inability: Not on file  . Transportation needs:    Medical: Not on file    Non-medical: Not on file  Tobacco Use  . Smoking status: Current Every Day Smoker    Packs/day: 0.50    Years: 50.00    Pack years: 25.00    Types: Cigars, Cigarettes    Start date: 02/09/1961  . Smokeless tobacco: Former Systems developer    Types: Chew  . Tobacco comment: 2 ppd, full flavor  Substance and Sexual Activity  . Alcohol use: Yes  . Drug use: Yes    Types: Cocaine  . Sexual activity: Yes  Lifestyle  . Physical activity:    Days per week: Not on file    Minutes per session: Not on file  . Stress: Not on file  Relationships  . Social connections:    Talks on phone: Not on file    Gets together: Not on file    Attends religious service: Not on file    Active member of club or organization: Not on file    Attends meetings of clubs or organizations: Not on file    Relationship status: Not on file  Other Topics Concern  . Not on file  Social History Narrative   Recovering alcoholic     Review of Systems: A 12 point ROS discussed and pertinent positives are indicated in the HPI above.  All other systems are negative.  Review of Systems  Vital Signs: BP 135/65 (BP Location: Right Arm)   Pulse 61   Temp 97.7 F (36.5 C) (Oral)  Resp 20   SpO2 94%    Physical Exam  Constitutional: He is oriented to person, place, and time. He appears well-developed.  Neck: Normal range of motion.  Cardiovascular: Normal rate and regular rhythm.  Pulmonary/Chest: Effort normal and breath sounds normal.  Abdominal: Soft.  Musculoskeletal:  3/5 strength left upper and lower extremity  Neurological: He is alert and oriented to person, place, and time.  Skin: Skin is warm and dry.  Psychiatric: He has a normal mood and affect. His behavior is normal. Judgment and thought content normal.  Vitals reviewed.   Imaging: Dg Chest 2 View  Result Date: 01/12/2018 CLINICAL DATA:  67 year old male with weakness. EXAM: CHEST - 2 VIEW COMPARISON:  Chest radiograph dated 11/28/2017 FINDINGS: There is no new consolidation, pleural effusion, or pneumothorax. Bibasilar atelectasis noted. There is pleural thickening or a small loculated collection along the right lateral pleural surface, similar to prior radiograph. A 9 mm right upper lobe nodule again noted as seen previously. There is cardiomegaly. No acute osseous pathology. IMPRESSION: 1. No acute cardiopulmonary process. 2. Cardiomegaly. 3. Small loculated pleural effusion versus pleural thickening along the right lateral pleural surface. 4. A 9 mm right upper lobe nodule as seen previously. Chest CT may provide better evaluation on a nonemergent basis. Electronically Signed   By: Anner Crete M.D.   On: 01/12/2018 21:13   Ct Head Wo Contrast  Result Date: 01/12/2018 CLINICAL DATA:  Altered level of consciousness EXAM: CT HEAD WITHOUT CONTRAST TECHNIQUE: Contiguous axial images were obtained from the base of the skull through the vertex without intravenous contrast. COMPARISON:  MRI 11/29/2017 FINDINGS: Brain: Old right frontoparietal infarct, stable. There is atrophy and chronic small vessel disease changes. No acute intracranial abnormality. Specifically, no hemorrhage, hydrocephalus, mass lesion, acute infarction,  or significant intracranial injury. Vascular: No hyperdense vessel or unexpected calcification. Skull: No acute calvarial abnormality. Sinuses/Orbits: No acute finding. Postoperative changes on the medial left orbital wall and orbital floor with mesh, stable. Other: None IMPRESSION: Old right frontal/parietal infarcts. No acute intracranial abnormality. Atrophy, chronic microvascular disease. Electronically Signed   By: Rolm Baptise M.D.   On: 01/12/2018 20:39   Mr Brain Wo Contrast  Result Date: 01/13/2018 CLINICAL DATA:  LEFT-sided weakness after fall. History of stroke, alcohol abuse, hypertension, hyperlipidemia and diabetes. EXAM: MRI HEAD WITHOUT CONTRAST TECHNIQUE: Multiplanar, multiecho pulse sequences of the brain and surrounding structures were obtained without intravenous contrast. COMPARISON:  CT HEAD January 12, 2018 and MRI of the head November 29, 2017 FINDINGS: Mild motion degraded examination. INTRACRANIAL CONTENTS: Patchy reduced diffusion RIGHT frontal lobe towards the convexity with low ADC values. RIGHT frontoparietal encephalomalacia. Scattered chronic microhemorrhages and RIGHT superficial siderosis. Mild ex vacuo dilatation RIGHT lateral ventricle. Mild parenchymal brain volume loss. No hydrocephalus. Linear T2 bright signal RIGHT cerebral peduncle seen with wallerian degeneration. Patchy supratentorial and pontine white matter FLAIR T2 hyperintensities. No midline shift, mass effect or masses. VASCULAR: Similar loss of RIGHT internal carotid artery flow void to level of the carotid terminus. SKULL AND UPPER CERVICAL SPINE: No abnormal sellar expansion. No suspicious calvarial bone marrow signal. Craniocervical junction maintained. SINUSES/ORBITS: Minimal fluid signal RIGHT petrous apex. Chronic LEFT maxillary sinusitis with atresia. Mild paranasal sinus mucosal thickening. The included ocular globes and orbital contents are non-suspicious. Status post bilateral ocular lens implants.  OTHER: Patient is edentulous. IMPRESSION: 1. Mild motion degraded examination. Multifocal acute small RIGHT frontal/MCA territory infarcts. 2. Old RIGHT MCA territory infarct.  Chronically occluded RIGHT ICA.  3. Similar parenchymal brain volume loss and mild-to-moderate chronic small vessel ischemic changes. 4. Stable chronic microhemorrhages and RIGHT superficial siderosis. Electronically Signed   By: Elon Alas M.D.   On: 01/13/2018 01:43    Labs:  CBC: Recent Labs    11/28/17 1436 11/28/17 1906 11/29/17 0557 01/12/18 2111 01/12/18 2125  WBC 5.1 7.3 7.9 6.7  --   HGB 12.1* 12.7* 13.0 13.4 15.3  HCT 39.6 42.0 43.3 45.1 45.0  PLT 149* 165 158 220  --     COAGS: Recent Labs    09/19/17 2033 10/25/17 2100 11/28/17 1906 01/12/18 2111  INR 1.00 0.98 1.15 1.01  APTT  --  28  --  29    BMP: Recent Labs    10/29/17 0550 11/28/17 1436 11/28/17 1906 11/29/17 0557 01/12/18 2111 01/12/18 2125  NA 142 146*  --  147* 144 144  K 4.0 3.8  --  3.6 3.7 4.1  CL 106 112*  --  112* 107 108  CO2 28 28  --  27 33*  --   GLUCOSE 96 102*  --  108* 121* 115*  BUN 11 14  --  10 13 18   CALCIUM 9.7 9.6  --  9.6 9.6  --   CREATININE 0.66 0.77 0.66 0.67 1.08 1.00  GFRNONAA >60 >60 >60 >60 >60  --   GFRAA >60 >60 >60 >60 >60  --     LIVER FUNCTION TESTS: Recent Labs    10/27/17 0344 11/28/17 1436 11/29/17 0557 01/12/18 2111  BILITOT 1.0 0.6 0.9 0.6  AST 14* 19 19 14*  ALT 14 17 17 13   ALKPHOS 62 67 74 78  PROT 5.8* 5.7* 6.1* 6.5  ALBUMIN 3.2* 3.2* 3.4* 3.6    TUMOR MARKERS: No results for input(s): AFPTM, CEA, CA199, CHROMGRNA in the last 8760 hours.  Assessment and Plan:  Old RIGHT MCA territory infarct.  Chronically occluded RIGHT ICA.  Will proceed with carotid/cerebral angiography tomorrow by Dr. Estanislado Pandy.  Risks and benefits of cerebral/carotid angiography were discussed with the patient including, but not limited to bleeding, infection, vascular injury or  contrast induced renal failure.  This interventional procedure involves the use of X-rays and because of the nature of the planned procedure, it is possible that we will have prolonged use of X-ray fluoroscopy.  Potential radiation risks to you include (but are not limited to) the following: - A slightly elevated risk for cancer  several years later in life. This risk is typically less than 0.5% percent. This risk is low in comparison to the normal incidence of human cancer, which is 33% for women and 50% for men according to the [place]. - Radiation induced injury can include skin redness, resembling a rash, tissue breakdown / ulcers and hair loss (which can be temporary or permanent).   The likelihood of either of these occurring depends on the difficulty of the procedure and whether you are sensitive to radiation due to previous procedures, disease, or genetic conditions.   IF your procedure requires a prolonged use of radiation, you will be notified and given written instructions for further action.  It is your responsibility to monitor the irradiated area for the 2 weeks following the procedure and to notify your physician if you are concerned that you have suffered a radiation induced injury.    All of the patient's questions were answered, patient is agreeable to proceed.  Consent signed.  Thank you for this interesting consult.  I  greatly enjoyed meeting [name] and look forward to participating in their care.  A copy of this report was sent to the requesting provider on this date.  Electronically Signed: Murrell Redden, PA-C   01/13/2018, 12:32 PM      I spent a total of 40 Minutes in face to face in clinical consultation, greater than 50% of which was counseling/coordinating care for cerebral angiography.

## 2018-01-14 ENCOUNTER — Inpatient Hospital Stay (HOSPITAL_COMMUNITY): Payer: Medicare Other

## 2018-01-14 DIAGNOSIS — Z8673 Personal history of transient ischemic attack (TIA), and cerebral infarction without residual deficits: Secondary | ICD-10-CM

## 2018-01-14 DIAGNOSIS — R569 Unspecified convulsions: Secondary | ICD-10-CM

## 2018-01-14 DIAGNOSIS — I63231 Cerebral infarction due to unspecified occlusion or stenosis of right carotid arteries: Secondary | ICD-10-CM

## 2018-01-14 HISTORY — PX: IR ANGIO VERTEBRAL SEL VERTEBRAL BILAT MOD SED: IMG5369

## 2018-01-14 HISTORY — PX: IR ANGIO INTRA EXTRACRAN SEL COM CAROTID INNOMINATE BILAT MOD SED: IMG5360

## 2018-01-14 HISTORY — PX: IR US GUIDE VASC ACCESS RIGHT: IMG2390

## 2018-01-14 LAB — GLUCOSE, CAPILLARY
GLUCOSE-CAPILLARY: 126 mg/dL — AB (ref 70–99)
Glucose-Capillary: 147 mg/dL — ABNORMAL HIGH (ref 70–99)

## 2018-01-14 MED ORDER — VERAPAMIL HCL 2.5 MG/ML IV SOLN
INTRAVENOUS | Status: AC
  Filled 2018-01-14: qty 2

## 2018-01-14 MED ORDER — FENTANYL CITRATE (PF) 100 MCG/2ML IJ SOLN
INTRAMUSCULAR | Status: AC | PRN
  Administered 2018-01-14: 25 ug via INTRAVENOUS

## 2018-01-14 MED ORDER — LIDOCAINE HCL 1 % IJ SOLN
INTRAMUSCULAR | Status: AC
  Filled 2018-01-14: qty 20

## 2018-01-14 MED ORDER — MIDAZOLAM HCL 2 MG/2ML IJ SOLN
INTRAMUSCULAR | Status: AC
  Filled 2018-01-14: qty 2

## 2018-01-14 MED ORDER — FENTANYL CITRATE (PF) 100 MCG/2ML IJ SOLN
INTRAMUSCULAR | Status: AC
  Filled 2018-01-14: qty 2

## 2018-01-14 MED ORDER — VERAPAMIL HCL 2.5 MG/ML IV SOLN
INTRAVENOUS | Status: AC | PRN
  Administered 2018-01-14: 2.5 mg via INTRA_ARTERIAL

## 2018-01-14 MED ORDER — HEPARIN SODIUM (PORCINE) 1000 UNIT/ML IJ SOLN
INTRAMUSCULAR | Status: AC | PRN
  Administered 2018-01-14: 2000 [IU] via INTRA_ARTERIAL

## 2018-01-14 MED ORDER — HEPARIN SODIUM (PORCINE) 1000 UNIT/ML IJ SOLN
INTRAMUSCULAR | Status: AC
  Filled 2018-01-14: qty 1

## 2018-01-14 MED ORDER — NITROGLYCERIN 1 MG/10 ML FOR IR/CATH LAB
INTRA_ARTERIAL | Status: AC
  Filled 2018-01-14: qty 10

## 2018-01-14 MED ORDER — MIDAZOLAM HCL 2 MG/2ML IJ SOLN
INTRAMUSCULAR | Status: AC | PRN
  Administered 2018-01-14: 1 mg via INTRAVENOUS

## 2018-01-14 MED ORDER — SODIUM CHLORIDE 0.9 % IV SOLN
INTRAVENOUS | Status: AC
  Administered 2018-01-14: 14:00:00 via INTRAVENOUS

## 2018-01-14 MED ORDER — NITROGLYCERIN 1 MG/10 ML FOR IR/CATH LAB
INTRA_ARTERIAL | Status: AC | PRN
  Administered 2018-01-14 (×2): 200 ug via INTRA_ARTERIAL

## 2018-01-14 NOTE — Progress Notes (Addendum)
PROGRESS NOTE    [name]  NFA:[id number] DOB: [birth year] DOA: 01/12/2018 PCP: Charlott Rakes, MD   Brief Narrative:  Patient is a 67 year old male with past medical history of coronary disease, chronic diastolic CHF, PVD, OSA, hypertension, diabetes, COPD with active tobacco abuse, recurrent hospitalization for embolic stroke, last hospitalization on September with multifocal right-sided infarct suspected due to chronically occluded right ICA  presented to the emergency department with complaints of 2-day history of increasing left-sided weakness, fall on the floor, inability to move his left lower extremity, left facial numbness..  He has minimal left-sided residual hemiparesis from the previous stroke.  MRI/MRI of the brain showed multiple acute infarcts in the right MCA territory in addition to his chronic right MCA infarct.  TPA was not given as the patient was out of therapeutic window.  Neurology consulted.  Plan is to do carotid/cerebral angiogram by IR today. Assessment & Plan:   Principal Problem:   CVA (cerebral vascular accident) ([place]) Active Problems:   COPD (chronic obstructive pulmonary disease) ([place])   Chronic diastolic congestive heart failure ([place])   Alcoholism ([place])   Embolic stroke ([place])   Cocaine abuse ([place])   Intracranial carotid stenosis   Polysubstance abuse ([place])   Tobacco use disorder   Coronary artery disease involving native coronary artery of native heart without angina pectoris   Diabetes mellitus, type 2 ([place])  Acute embolic stroke: He has significant risk factors.  He has recurrent stroke,  ongoing tobacco abuse, cocaine abuse.  Other risks factors include hypertension, diabetes.  Neurology evaluated him.  Continue aspirin, Plavix and statin. LDL of 98, hemoglobin A1 C 5.4. Allow permissive hypertension.   2D echocardiogram showed ejection fraction of 55 to 46%, grade 1 diastolic dysfunction. Sickle therapy evaluated him and recommended inpatient  rehab.  He has been discharged to skilled nursing facility in the past after his previous stroke. Plan for carotid/cerebral angiogram today.  Polysubstance abuse/alcohol abuse: UDS positive for cocaine.  Counseled for cessation.  Continue to monitor the CIWA .  Order nicotine patch  Diabetes mellitus type 2: Not on any medications at home.  Hemoglobin A1c is good.  Continue sliding scale insulin  Chronic diastolic CHF/coronary disease: Currently compensated.  Continue aspirin, statin.  Also on Coreg at home.  COPD: Continue home inhalers.  Currently stable.  Fall with left-sided chest pain: No signs of rib fracture on chest x-ray.  PT evaluation done.  Right upper lobe nodule: Noncontrast CT is recommended as an outpatient.  Has been ordered.         DVT prophylaxis: Lovenox  Code Status: DNR Family Communication: None present at the bedside Disposition Plan: CIR/SNF after full work-up   Consultants: Neurology, IR  Procedures: None  Antimicrobials: None  Subjective: Patient seen and examined the bedside this morning.  Events hemodynamically stable.  Remains significantly weak on left upper extremity.  No complaints of chest pain or shortness of breath.  Objective: Vitals:   01/14/18 0004 01/14/18 0443 01/14/18 0729 01/14/18 0820  BP: 140/62 (!) 155/70 (!) 155/78   Pulse: 76 83 82 82  Resp: 18 18 20 20   Temp: 98.2 F (36.8 C) 98.1 F (36.7 C) 97.7 F (36.5 C)   TempSrc: Oral Oral Oral   SpO2: 96% 91% 90% 92%    Intake/Output Summary (Last 24 hours) at 01/14/2018 1114 Last data filed at 01/14/2018 0945 Gross per 24 hour  Intake 777 ml  Output 1150 ml  Net -373 ml   There  were no vitals filed for this visit.  Examination:  General exam: Appears calm and comfortable ,Not in distress,average built HEENT:PERRL,Oral mucosa moist, Ear/Nose normal on gross exam Respiratory system: Bilateral equal air entry, normal vesicular breath sounds, no wheezes or crackles    Cardiovascular system: S1 & S2 heard, RRR. No JVD, murmurs, rubs, gallops or clicks. No pedal edema. Gastrointestinal system: Abdomen is nondistended, soft and nontender. No organomegaly or masses felt. Normal bowel sounds heard. Central nervous system: Alert and oriented.  Power: Upper: 0/5 on the proximal left, 2-3/5 on the distal,5/5/on the right Lower: 4/5 on the left, 5/5 on the right  Extremities: No edema, no clubbing ,no cyanosis, distal peripheral pulses palpable. Skin: No rashes, lesions or ulcers,no icterus ,no pallor MSK: Normal muscle bulk,tone ,power Psychiatry: Judgement and insight appear normal. Mood & affect appropriate.     Data Reviewed: I have personally reviewed following labs and imaging studies  CBC: Recent Labs  Lab 01/12/18 2111 01/12/18 2125  WBC 6.7  --   NEUTROABS 4.4  --   HGB 13.4 15.3  HCT 45.1 45.0  MCV 90.6  --   PLT 220  --    Basic Metabolic Panel: Recent Labs  Lab 01/12/18 2111 01/12/18 2125  NA 144 144  K 3.7 4.1  CL 107 108  CO2 33*  --   GLUCOSE 121* 115*  BUN 13 18  CREATININE 1.08 1.00  CALCIUM 9.6  --    GFR: Estimated Creatinine Clearance: 86.1 mL/min (by C-G formula based on SCr of 1 mg/dL). Liver Function Tests: Recent Labs  Lab 01/12/18 2111  AST 14*  ALT 13  ALKPHOS 78  BILITOT 0.6  PROT 6.5  ALBUMIN 3.6   No results for input(s): LIPASE, AMYLASE in the last 168 hours. No results for input(s): AMMONIA in the last 168 hours. Coagulation Profile: Recent Labs  Lab 01/12/18 2111  INR 1.01   Cardiac Enzymes: No results for input(s): CKTOTAL, CKMB, CKMBINDEX, TROPONINI in the last 168 hours. BNP (last 3 results) No results for input(s): PROBNP in the last 8760 hours. HbA1C: Recent Labs    01/12/18 1428 01/13/18 0549  HGBA1C 5.9 5.4   CBG: Recent Labs  Lab 01/13/18 2254 01/14/18 0630  GLUCAP 97 126*   Lipid Profile: Recent Labs    01/13/18 0549  CHOL 161  HDL 30*  LDLCALC 98  TRIG 167*   CHOLHDL 5.4   Thyroid Function Tests: No results for input(s): TSH, T4TOTAL, FREET4, T3FREE, THYROIDAB in the last 72 hours. Anemia Panel: No results for input(s): VITAMINB12, FOLATE, FERRITIN, TIBC, IRON, RETICCTPCT in the last 72 hours. Sepsis Labs: No results for input(s): PROCALCITON, LATICACIDVEN in the last 168 hours.  No results found for this or any previous visit (from the past 240 hour(s)).       Radiology Studies: Dg Chest 2 View  Result Date: 01/12/2018 CLINICAL DATA:  67 year old male with weakness. EXAM: CHEST - 2 VIEW COMPARISON:  Chest radiograph dated 11/28/2017 FINDINGS: There is no new consolidation, pleural effusion, or pneumothorax. Bibasilar atelectasis noted. There is pleural thickening or a small loculated collection along the right lateral pleural surface, similar to prior radiograph. A 9 mm right upper lobe nodule again noted as seen previously. There is cardiomegaly. No acute osseous pathology. IMPRESSION: 1. No acute cardiopulmonary process. 2. Cardiomegaly. 3. Small loculated pleural effusion versus pleural thickening along the right lateral pleural surface. 4. A 9 mm right upper lobe nodule as seen previously. Chest CT may  provide better evaluation on a nonemergent basis. Electronically Signed   By: Anner Crete M.D.   On: 01/12/2018 21:13   Ct Head Wo Contrast  Result Date: 01/12/2018 CLINICAL DATA:  Altered level of consciousness EXAM: CT HEAD WITHOUT CONTRAST TECHNIQUE: Contiguous axial images were obtained from the base of the skull through the vertex without intravenous contrast. COMPARISON:  MRI 11/29/2017 FINDINGS: Brain: Old right frontoparietal infarct, stable. There is atrophy and chronic small vessel disease changes. No acute intracranial abnormality. Specifically, no hemorrhage, hydrocephalus, mass lesion, acute infarction, or significant intracranial injury. Vascular: No hyperdense vessel or unexpected calcification. Skull: No acute calvarial  abnormality. Sinuses/Orbits: No acute finding. Postoperative changes on the medial left orbital wall and orbital floor with mesh, stable. Other: None IMPRESSION: Old right frontal/parietal infarcts. No acute intracranial abnormality. Atrophy, chronic microvascular disease. Electronically Signed   By: Rolm Baptise M.D.   On: 01/12/2018 20:39   Mr Brain Wo Contrast  Result Date: 01/13/2018 CLINICAL DATA:  LEFT-sided weakness after fall. History of stroke, alcohol abuse, hypertension, hyperlipidemia and diabetes. EXAM: MRI HEAD WITHOUT CONTRAST TECHNIQUE: Multiplanar, multiecho pulse sequences of the brain and surrounding structures were obtained without intravenous contrast. COMPARISON:  CT HEAD January 12, 2018 and MRI of the head November 29, 2017 FINDINGS: Mild motion degraded examination. INTRACRANIAL CONTENTS: Patchy reduced diffusion RIGHT frontal lobe towards the convexity with low ADC values. RIGHT frontoparietal encephalomalacia. Scattered chronic microhemorrhages and RIGHT superficial siderosis. Mild ex vacuo dilatation RIGHT lateral ventricle. Mild parenchymal brain volume loss. No hydrocephalus. Linear T2 bright signal RIGHT cerebral peduncle seen with wallerian degeneration. Patchy supratentorial and pontine white matter FLAIR T2 hyperintensities. No midline shift, mass effect or masses. VASCULAR: Similar loss of RIGHT internal carotid artery flow void to level of the carotid terminus. SKULL AND UPPER CERVICAL SPINE: No abnormal sellar expansion. No suspicious calvarial bone marrow signal. Craniocervical junction maintained. SINUSES/ORBITS: Minimal fluid signal RIGHT petrous apex. Chronic LEFT maxillary sinusitis with atresia. Mild paranasal sinus mucosal thickening. The included ocular globes and orbital contents are non-suspicious. Status post bilateral ocular lens implants. OTHER: Patient is edentulous. IMPRESSION: 1. Mild motion degraded examination. Multifocal acute small RIGHT frontal/MCA  territory infarcts. 2. Old RIGHT MCA territory infarct.  Chronically occluded RIGHT ICA. 3. Similar parenchymal brain volume loss and mild-to-moderate chronic small vessel ischemic changes. 4. Stable chronic microhemorrhages and RIGHT superficial siderosis. Electronically Signed   By: Elon Alas M.D.   On: 01/13/2018 01:43        Scheduled Meds: . allopurinol  300 mg Oral Daily  . aspirin  300 mg Rectal Daily   Or  . aspirin  325 mg Oral Daily  . atorvastatin  80 mg Oral q1800  . clopidogrel  75 mg Oral Daily  . DULoxetine  60 mg Oral Daily  . [START ON 01/15/2018] enoxaparin (LOVENOX) injection  40 mg Subcutaneous Q24H  . feeding supplement (ENSURE ENLIVE)  237 mL Oral BID BM  . folic acid  1 mg Oral Daily  . insulin aspart  0-15 Units Subcutaneous TID WC  . insulin aspart  0-5 Units Subcutaneous QHS  . levETIRAcetam  500 mg Oral BID  . memantine  10 mg Oral BID  . mometasone-formoterol  2 puff Inhalation BID  . multivitamin with minerals  1 tablet Oral Daily  . nicotine  21 mg Transdermal Daily  . pantoprazole  40 mg Oral Daily  . pregabalin  150 mg Oral BID  . rOPINIRole  0.5 mg Oral QHS  .  thiamine  100 mg Oral Daily   Or  . thiamine  100 mg Intravenous Daily   Continuous Infusions:   LOS: 1 day    Time spent: 35 mins.More than 50% of that time was spent in counseling and/or coordination of care.      Shelly Coss, MD [facility] Pager [phone]  If 7PM-7AM, please contact night-coverage [email or web address] Password [email or web address] 01/14/2018, 11:14 AM

## 2018-01-14 NOTE — Sedation Documentation (Signed)
Right arterial sheath removed from right radial artery. TR band applied by Dr. Estanislado Pandy. 13cc of air in TR band. Right radial pulse palpable.

## 2018-01-14 NOTE — Plan of Care (Signed)
  Problem: Education: Goal: Knowledge of General Education information will improve Description Including pain rating scale, medication(s)/side effects and non-pharmacologic comfort measures Outcome: Progressing   Problem: Health Behavior/Discharge Planning: Goal: Ability to manage health-related needs will improve Outcome: Progressing   Problem: Clinical Measurements: Goal: Ability to maintain clinical measurements within normal limits will improve Outcome: Progressing Goal: Will remain free from infection Outcome: Progressing Goal: Diagnostic test results will improve Outcome: Progressing Goal: Respiratory complications will improve Outcome: Progressing Goal: Cardiovascular complication will be avoided Outcome: Progressing   Problem: Activity: Goal: Risk for activity intolerance will decrease Outcome: Progressing   Problem: Nutrition: Goal: Adequate nutrition will be maintained Outcome: Progressing   Problem: Coping: Goal: Level of anxiety will decrease Outcome: Progressing   Problem: Elimination: Goal: Will not experience complications related to bowel motility Outcome: Progressing Goal: Will not experience complications related to urinary retention Outcome: Progressing   Problem: Pain Managment: Goal: General experience of comfort will improve Outcome: Progressing   Problem: Safety: Goal: Ability to remain free from injury will improve Outcome: Progressing   Problem: Skin Integrity: Goal: Risk for impaired skin integrity will decrease Outcome: Progressing   Problem: Education: Goal: Knowledge of disease or condition will improve Outcome: Progressing Goal: Knowledge of secondary prevention will improve Outcome: Progressing Goal: Knowledge of patient specific risk factors addressed and post discharge goals established will improve Outcome: Progressing   Problem: Ischemic Stroke/TIA Tissue Perfusion: Goal: Complications of ischemic stroke/TIA will be  minimized Outcome: Progressing

## 2018-01-14 NOTE — Progress Notes (Signed)
PT Cancellation Note  Patient Details Name: YOTAM RHINE MRN: [id number] DOB: Dec 29, 1950   Cancelled Treatment:    Reason Eval/Treat Not Completed: (P) Patient not medically ready   Patient had TR band placed today.  Will cancel treatment for today and follow up per plan of care.  [address], SPTA  Tawanda Schall 01/14/2018, 2:10 PM

## 2018-01-14 NOTE — Procedures (Signed)
S/P 4 vessel cerebral arteriogram RT radial approach. Findings. 1.Occluded Rt ICA stent, without evidence of a string sign.

## 2018-01-14 NOTE — Progress Notes (Signed)
OT Cancellation Note  Patient Details Name: [name] MRN: [id number] DOB: [birth year]   Cancelled Treatment:    Reason Eval/Treat Not Completed: Patient at procedure or test/ unavailable(Angiogram)  Ramond Dial, OT/L   Acute OT Clinical Specialist Acute Rehabilitation Services Pager [phone] Office [phone]  01/14/2018, 11:20 AM

## 2018-01-14 NOTE — Sedation Documentation (Signed)
Right radial TR band assessed with 3W RN. Pulses checks complete

## 2018-01-14 NOTE — Consult Note (Signed)
Physical Medicine and Rehabilitation Consult Reason for Consult:  Left Side weakness Referring Physician: [facility]   HPI: [name] is a 67 y.o.right handed male with history of diabetes mellitus, CAD, alcohol abuse, diastolic congestive heart failure, COPD,chronically occluded right ICA, recent CVA with left-sided residual weakness and discharged to [facility] facility for 3 weeks before returning home. Per chart review patient lives at [facility] senior apartments with assistance from a home health aide. He was using a walker while at home was noted multiple falls.Patient does not have any local family. Presented 01/13/2018 with increasing weakness 2-3 days. MRI shows multifocal acute small right frontal MCA territory infarcts. Old right MCA territory infarct.cerebral angiogram pending. Echocardiogram pending. Patient presently remains on aspirin and Plavix for CVA prophylaxis. Subcutaneous Lovenox for DVT prophylaxisTherapy evaluation completed with recommendations of physical medicine rehabilitation consult.   Review of Systems  Constitutional: Negative for chills and fever.  HENT: Negative for hearing loss.   Eyes: Negative for blurred vision and double vision.  Respiratory: Positive for shortness of breath.   Cardiovascular: Positive for leg swelling. Negative for chest pain and palpitations.  Gastrointestinal: Positive for constipation. Negative for nausea.       GERD  Genitourinary: Positive for urgency. Negative for dysuria, flank pain and hematuria.  Musculoskeletal: Positive for falls and myalgias.  Skin: Negative for rash.  Psychiatric/Behavioral: Positive for depression.       Anxiety  All other systems reviewed and are negative.  Past Medical History:  Diagnosis Date  . Alcohol abuse    H/O  . Anxiety   . Arthritis   . Asthma   . CAD (coronary artery disease) 05/27/10   Cath: severe single vessell CAD left cx midportion obtuse  marginal 2 to 3.  [name] CHF (congestive heart failure) ([place])   . COPD (chronic obstructive pulmonary disease) ([place])   . COPD (chronic obstructive pulmonary disease) ([place])   . Depression   . Diabetes mellitus, type 2 ([place]) 03/13/2015  . GERD (gastroesophageal reflux disease)   . HCAP (healthcare-associated pneumonia) 10/15/2015  . History of DVT of lower extremity   . Hyperlipemia   . Hyperlipidemia   . Hypertension   . Myocardial infarction ([place]) 2000  . Orbital fracture 12/2012  . OSA on CPAP   . Peripheral vascular disease, unspecified ([place])    08/20/10 doppler: increase in right ABI post-op. Left ABI stable. S/P bi-fem bypass surgery  . Pneumonia 04/04/2012  . Shortness of breath   . Stroke ([place])   . Tobacco abuse    Past Surgical History:  Procedure Laterality Date  . abdoninal ao angio & bifem angio  05/27/10   Patent graft, occluded bil stents with no retrograde flow into the hypogastric arteries. 100% occl left ant. tibial artery, 70% to 80% to 100% stenosis right superficial fem artery above adductor canal. 100 % occl right ant. tibial vessell  . Aortogram w/ PTCA  09/292003  . BACK SURGERY    . CARDIAC CATHETERIZATION  05/27/10   severe CAD left cx  . CHOLECYSTECTOMY    . ESOPHAGOGASTRIC FUNDOPLICATION    . EYE SURGERY    . FEMORAL BYPASS  08/19/10   Right Fem-Pop  . IR ANGIO INTRA EXTRACRAN SEL COM CAROTID INNOMINATE BILAT MOD SED  07/20/2016  . IR ANGIO VERTEBRAL SEL SUBCLAVIAN INNOMINATE UNI R MOD SED  07/20/2016  . IR ANGIO VERTEBRAL SEL VERTEBRAL UNI L MOD SED  07/20/2016  . IR RADIOLOGIST  EVAL & MGMT  05/27/2016  . JOINT REPLACEMENT    . PERIPHERAL VASCULAR CATHETERIZATION N/A 12/10/2014   Procedure: Abdominal Aortogram;  Surgeon: Angelia Mould, MD;  Location: [facility];  Service: Cardiovascular;  Laterality: N/A;  . RADIOLOGY WITH ANESTHESIA N/A 02/08/2015   Procedure: RADIOLOGY WITH ANESTHESIA;  Surgeon: Luanne Bras, MD;  Location: [facility];  Service:  Radiology;  Laterality: N/A;  . TEE WITHOUT CARDIOVERSION N/A 08/29/2014   Procedure: TRANSESOPHAGEAL ECHOCARDIOGRAM (TEE);  Surgeon: Josue Hector, MD;  [facility];  Service: Cardiovascular;  Laterality: N/A;  . TOTAL KNEE ARTHROPLASTY     Family History  Problem Relation Age of Onset  . Heart attack Mother   . Heart failure Mother   . Cirrhosis Father   . Heart failure Brother   . Cancer Brother   . Hypertension Neg Hx        UNKNOWN  . Stroke Neg Hx        UNKNOWN   Social History:  reports that he has been smoking cigars and cigarettes. He started smoking about 56 years ago. He has a 25.00 pack-year smoking history. He has quit using smokeless tobacco.  His smokeless tobacco use included chew. He reports that he drinks alcohol. He reports that he has current or past drug history. Drug: Cocaine. Allergies:  Allergies  Allergen Reactions  . Darvocet [Propoxyphene N-Acetaminophen] Hives  . Haldol [Haloperidol Decanoate] Hives  . Acetaminophen Nausea Only    Upset stomach, tolerates Hydrocodone/APAP if taken with food  . Metformin And Related Nausea And Vomiting  . Norco [Hydrocodone-Acetaminophen] Nausea And Vomiting    Tolerates if taken with food   Medications Prior to Admission  Medication Sig Dispense Refill  . ACCU-CHEK SOFTCLIX LANCETS lancets Use 3 times daily before meals 100 each 5  . albuterol (PROAIR HFA) 108 (90 Base) MCG/ACT inhaler INHALE 2 PUFFS EVERY 6 HOURS AS NEEDED FOR WHEEZING OR SHORTNESS OF BREATH (Patient taking differently: Inhale 2 puffs into the lungs every 6 (six) hours as needed for wheezing or shortness of breath. ) 8.5 g 2  . allopurinol (ZYLOPRIM) 300 MG tablet Take 1 tablet (300 mg total) by mouth daily. 30 tablet 3  . aspirin 325 MG tablet Take 1 tablet (325 mg total) by mouth daily. (Patient taking differently: Take 81 mg by mouth daily. )    . atorvastatin (LIPITOR) 80 MG tablet Take 1 tablet (80 mg total) by mouth daily at 6 PM. 30  tablet 3  . Blood Glucose Monitoring Suppl (ACCU-CHEK AVIVA) device Use 3 times daily before meals. 1 each 0  . carvedilol (COREG) 12.5 MG tablet Take 1 tablet (12.5 mg total) by mouth 2 (two) times daily with a meal. 60 tablet 3  . clopidogrel (PLAVIX) 75 MG tablet Take 1 tablet (75 mg total) by mouth daily. 30 tablet 3  . DULoxetine (CYMBALTA) 60 MG capsule Take 1 capsule (60 mg total) by mouth daily. 30 capsule 3  . Fluticasone-Salmeterol (ADVAIR DISKUS) 250-50 MCG/DOSE AEPB Inhale 1 puff into the lungs 2 (two) times daily. 1 each 3  . furosemide (LASIX) 20 MG tablet Take 2 tablets (40 mg total) by mouth 2 (two) times daily. 360 tablet 0  . glucose blood (ACCU-CHEK AVIVA) test strip Use 3 times daily before meals as directed. 100 each 12  . hydrocortisone cream 1 % Apply 1 application topically 2 (two) times daily as needed for itching.     . hydrOXYzine (ATARAX/VISTARIL) 25 MG  tablet Take 25 mg by mouth every 6 (six) hours as needed for anxiety or itching.     . lactulose (CHRONULAC) 10 GM/15ML solution Take 15 mLs (10 g total) by mouth 2 (two) times daily as needed for mild constipation. 236 mL 1  . Lancet Devices (ACCU-CHEK SOFTCLIX) lancets Use 3 times daily before meals 1 each 5  . levETIRAcetam (KEPPRA) 500 MG tablet Take 1 tablet (500 mg total) by mouth 2 (two) times daily. 60 tablet 3  . memantine (NAMENDA) 10 MG tablet Take 1 tablet (10 mg total) by mouth 2 (two) times daily. 60 tablet 3  . naphazoline-pheniramine (NAPHCON-A) 0.025-0.3 % ophthalmic solution Place 1 drop into the left eye 4 (four) times daily as needed for eye irritation.  0  . pantoprazole (PROTONIX) 40 MG tablet Take 1 tablet (40 mg total) by mouth daily. 30 tablet 3  . pregabalin (LYRICA) 150 MG capsule Take 1 capsule (150 mg total) by mouth 2 (two) times daily. 180 capsule 0  . rOPINIRole (REQUIP) 0.5 MG tablet Take 1 tablet (0.5 mg total) by mouth at bedtime. 30 tablet 3  . tiZANidine (ZANAFLEX) 4 MG tablet Take 1  tablet (4 mg total) by mouth every 8 (eight) hours as needed for muscle spasms. 90 tablet 3  . traZODone (DESYREL) 150 MG tablet Take 1 tablet (150 mg total) by mouth at bedtime as needed for sleep. (Patient not taking: Reported on 11/28/2017) 30 tablet 3    Home: [place] expects to be discharged to:: Private residence Living Arrangements: Alone Available Help at Discharge: Personal care attendant(3 hours M-F some times S&S) Type of Home: Apartment(Hall towers) Home Access: Elevator Home Layout: One level Bathroom Shower/Tub: Chiropodist: Handicapped height Home Equipment: Radio producer - single point, Civil engineer, contracting, Environmental consultant - 4 wheels, Grab bars - tub/shower, Bedside commode, Wheelchair - power Additional Comments: had a manual w/c but got lost in ALF in [place] and they lost the power cord for electric w/c  Functional History: Prior Function Level of Independence: Needs assistance Gait / Transfers Assistance Needed: walks with cane  ADL's / Homemaking Assistance Needed: needs assist; aide helps with bath, clothing, medications, meals and takes to appointments Communication / Swallowing Assistance Needed: HOH Functional Status:  Mobility: Bed Mobility Overal bed mobility: Needs Assistance Bed Mobility: Supine to Sit Supine to sit: Supervision, HOB elevated General bed mobility comments: heavy use of bed rail and increased time two attempts for success Transfers Overall transfer level: Needs assistance Equipment used: None, Straight cane Transfers: Sit to/from Stand, Stand Pivot Transfers Sit to Stand: Min assist Stand pivot transfers: Min assist General transfer comment: assist to stabilize, initially from bed no device, then with cane from recliner Ambulation/Gait Ambulation/Gait assistance: Min assist Gait Distance (Feet): 80 Feet Assistive device: Straight cane Gait Pattern/deviations: Step-to pattern, Decreased stride length, Wide base of  support General Gait Details: increased time and very carefully places L foot for balance, and to avoid buckling, increased time for turns, assist needed for balance/safety    ADL:    Cognition: Cognition Overall Cognitive Status: No family/caregiver present to determine baseline cognitive functioning Orientation Level: Oriented X4 Cognition Arousal/Alertness: Awake/alert Behavior During Therapy: WFL for tasks assessed/performed Overall Cognitive Status: No family/caregiver present to determine baseline cognitive functioning  Blood pressure (!) 155/78, pulse 82, temperature 97.7 F (36.5 C), temperature source Oral, resp. rate 20, SpO2 92 %. Physical Exam  Constitutional: He appears well-developed.  HENT:  Head: Normocephalic.  Eyes: Pupils  are equal, round, and reactive to light.  Neck: Normal range of motion.  Cardiovascular: Normal rate.  Respiratory: Effort normal.  GI: Soft.  Musculoskeletal: He exhibits no edema.  Neurological:  Patient is alert and severely dysarthric. Follow simple commands. He was able to provide his name and age. Left sided weakness  Psychiatric: He has a normal mood and affect. His behavior is normal.    Results for orders placed or performed during the hospital encounter of 01/12/18 (from the past 24 hour(s))  Glucose, capillary     Status: None   Collection Time: 01/13/18 10:54 PM  Result Value Ref Range   Glucose-Capillary 97 70 - 99 mg/dL  Glucose, capillary     Status: Abnormal   Collection Time: 01/14/18  6:30 AM  Result Value Ref Range   Glucose-Capillary 126 (H) 70 - 99 mg/dL   Comment 1 Notify RN    Comment 2 Document in Chart    Dg Chest 2 View  Result Date: 01/12/2018 CLINICAL DATA:  67 year old male with weakness. EXAM: CHEST - 2 VIEW COMPARISON:  Chest radiograph dated 11/28/2017 FINDINGS: There is no new consolidation, pleural effusion, or pneumothorax. Bibasilar atelectasis noted. There is pleural thickening or a small  loculated collection along the right lateral pleural surface, similar to prior radiograph. A 9 mm right upper lobe nodule again noted as seen previously. There is cardiomegaly. No acute osseous pathology. IMPRESSION: 1. No acute cardiopulmonary process. 2. Cardiomegaly. 3. Small loculated pleural effusion versus pleural thickening along the right lateral pleural surface. 4. A 9 mm right upper lobe nodule as seen previously. Chest CT may provide better evaluation on a nonemergent basis. Electronically Signed   By: Anner Crete M.D.   On: 01/12/2018 21:13   Ct Head Wo Contrast  Result Date: 01/12/2018 CLINICAL DATA:  Altered level of consciousness EXAM: CT HEAD WITHOUT CONTRAST TECHNIQUE: Contiguous axial images were obtained from the base of the skull through the vertex without intravenous contrast. COMPARISON:  MRI 11/29/2017 FINDINGS: Brain: Old right frontoparietal infarct, stable. There is atrophy and chronic small vessel disease changes. No acute intracranial abnormality. Specifically, no hemorrhage, hydrocephalus, mass lesion, acute infarction, or significant intracranial injury. Vascular: No hyperdense vessel or unexpected calcification. Skull: No acute calvarial abnormality. Sinuses/Orbits: No acute finding. Postoperative changes on the medial left orbital wall and orbital floor with mesh, stable. Other: None IMPRESSION: Old right frontal/parietal infarcts. No acute intracranial abnormality. Atrophy, chronic microvascular disease. Electronically Signed   By: Rolm Baptise M.D.   On: 01/12/2018 20:39   Mr Brain Wo Contrast  Result Date: 01/13/2018 CLINICAL DATA:  LEFT-sided weakness after fall. History of stroke, alcohol abuse, hypertension, hyperlipidemia and diabetes. EXAM: MRI HEAD WITHOUT CONTRAST TECHNIQUE: Multiplanar, multiecho pulse sequences of the brain and surrounding structures were obtained without intravenous contrast. COMPARISON:  CT HEAD January 12, 2018 and MRI of the head November 29, 2017 FINDINGS: Mild motion degraded examination. INTRACRANIAL CONTENTS: Patchy reduced diffusion RIGHT frontal lobe towards the convexity with low ADC values. RIGHT frontoparietal encephalomalacia. Scattered chronic microhemorrhages and RIGHT superficial siderosis. Mild ex vacuo dilatation RIGHT lateral ventricle. Mild parenchymal brain volume loss. No hydrocephalus. Linear T2 bright signal RIGHT cerebral peduncle seen with wallerian degeneration. Patchy supratentorial and pontine white matter FLAIR T2 hyperintensities. No midline shift, mass effect or masses. VASCULAR: Similar loss of RIGHT internal carotid artery flow void to level of the carotid terminus. SKULL AND UPPER CERVICAL SPINE: No abnormal sellar expansion. No suspicious calvarial bone marrow signal. Craniocervical  junction maintained. SINUSES/ORBITS: Minimal fluid signal RIGHT petrous apex. Chronic LEFT maxillary sinusitis with atresia. Mild paranasal sinus mucosal thickening. The included ocular globes and orbital contents are non-suspicious. Status post bilateral ocular lens implants. OTHER: Patient is edentulous. IMPRESSION: 1. Mild motion degraded examination. Multifocal acute small RIGHT frontal/MCA territory infarcts. 2. Old RIGHT MCA territory infarct.  Chronically occluded RIGHT ICA. 3. Similar parenchymal brain volume loss and mild-to-moderate chronic small vessel ischemic changes. 4. Stable chronic microhemorrhages and RIGHT superficial siderosis. Electronically Signed   By: Elon Alas M.D.   On: 01/13/2018 01:43     Assessment/Plan: Diagnosis: right frontal MCA infarcts with left hemiparesis 1. Does the need for close, 24 hr/day medical supervision in concert with the patient's rehab needs make it unreasonable for this patient to be served in a less intensive setting? Yes 2. Co-Morbidities requiring supervision/potential complications: CAD, dCHF, COPD 3. Due to bladder management, bowel management, safety, skin/wound care,  disease management, medication administration and patient education, does the patient require 24 hr/day rehab nursing? Yes 4. Does the patient require coordinated care of a physician, rehab nurse, PT (1-2 hrs/day, 5 days/week), OT (1-2 hrs/day, 5 days/week) and SLP (1-2 hrs/day, 5 days/week) to address physical and functional deficits in the context of the above medical diagnosis(es)? Yes Addressing deficits in the following areas: balance, endurance, locomotion, strength, transferring, bowel/bladder control, bathing, dressing, feeding, grooming, toileting, cognition, speech, swallowing and psychosocial support 5. Can the patient actively participate in an intensive therapy program of at least 3 hrs of therapy per day at least 5 days per week? Yes 6. The potential for patient to make measurable gains while on inpatient rehab is good 7. Anticipated functional outcomes upon discharge from inpatient rehab are modified independent  with PT, modified independent and supervision with OT, modified independent with SLP. 8. Estimated rehab length of stay to reach the above functional goals is: 9-15 days 9. Anticipated D/C setting: Home 10. Anticipated post D/C treatments: [place] therapy 11. Overall Rehab/Functional Prognosis: good  RECOMMENDATIONS: This patient's condition is appropriate for continued rehabilitative care in the following setting: CIR Patient has agreed to participate in recommended program. Yes Note that insurance prior authorization may be required for reimbursement for recommended care.  Comment: Rehab Admissions Coordinator to follow up.  Thanks,  [name], MD, Mellody Drown  I have personally performed a face to face diagnostic evaluation of this patient. Additionally, I have reviewed and concur with the physician assistant's documentation above.    Lavon Paganini Angiulli, PA-C 01/14/2018

## 2018-01-14 NOTE — Progress Notes (Signed)
STROKE TEAM PROGRESS NOTE   SUBJECTIVE (INTERVAL HISTORY) His RN is at bedside. Pt just came back from DSA which showed chronically occluded right ICA stent without string sign.   OBJECTIVE Vitals:   01/14/18 1300 01/14/18 1305 01/14/18 1310 01/14/18 1315  BP: (!) 162/81 (!) 163/80 (!) 161/85 (!) 150/87  Pulse: 91 89 90 99  Resp: (!) 24 (!) 23 (!) 23 20  Temp:      TempSrc:      SpO2: 96% 99% 100% 98%    CBC:  Recent Labs  Lab 01/12/18 2111 01/12/18 2125  WBC 6.7  --   NEUTROABS 4.4  --   HGB 13.4 15.3  HCT 45.1 45.0  MCV 90.6  --   PLT 220  --     Basic Metabolic Panel:  Recent Labs  Lab 01/12/18 2111 01/12/18 2125  NA 144 144  K 3.7 4.1  CL 107 108  CO2 33*  --   GLUCOSE 121* 115*  BUN 13 18  CREATININE 1.08 1.00  CALCIUM 9.6  --     Lipid Panel:     Component Value Date/Time   CHOL 161 01/13/2018 0549   TRIG 167 (H) 01/13/2018 0549   HDL 30 (L) 01/13/2018 0549   CHOLHDL 5.4 01/13/2018 0549   VLDL 33 01/13/2018 0549   LDLCALC 98 01/13/2018 0549   HgbA1c:  Lab Results  Component Value Date   HGBA1C 5.4 01/13/2018   Urine Drug Screen:     Component Value Date/Time   LABOPIA NONE DETECTED 01/12/2018 2117   COCAINSCRNUR POSITIVE (A) 01/12/2018 2117   LABBENZ NONE DETECTED 01/12/2018 2117   AMPHETMU NONE DETECTED 01/12/2018 2117   THCU NONE DETECTED 01/12/2018 2117   LABBARB NONE DETECTED 01/12/2018 2117    Alcohol Level     Component Value Date/Time   [facility] <10 01/12/2018 2111    IMAGING Reviewed:   Dg Chest 2 View  Result Date: 01/12/2018 CLINICAL DATA:  67 year old male with weakness. EXAM: CHEST - 2 VIEW COMPARISON:  Chest radiograph dated 11/28/2017 FINDINGS: There is no new consolidation, pleural effusion, or pneumothorax. Bibasilar atelectasis noted. There is pleural thickening or a small loculated collection along the right lateral pleural surface, similar to prior radiograph. A 9 mm right upper lobe nodule again noted as seen  previously. There is cardiomegaly. No acute osseous pathology. IMPRESSION: 1. No acute cardiopulmonary process. 2. Cardiomegaly. 3. Small loculated pleural effusion versus pleural thickening along the right lateral pleural surface. 4. A 9 mm right upper lobe nodule as seen previously. Chest CT may provide better evaluation on a nonemergent basis. Electronically Signed   By: Anner Crete M.D.   On: 01/12/2018 21:13   Ct Head Wo Contrast  Result Date: 01/12/2018 CLINICAL DATA:  Altered level of consciousness EXAM: CT HEAD WITHOUT CONTRAST TECHNIQUE: Contiguous axial images were obtained from the base of the skull through the vertex without intravenous contrast. COMPARISON:  MRI 11/29/2017 FINDINGS: Brain: Old right frontoparietal infarct, stable. There is atrophy and chronic small vessel disease changes. No acute intracranial abnormality. Specifically, no hemorrhage, hydrocephalus, mass lesion, acute infarction, or significant intracranial injury. Vascular: No hyperdense vessel or unexpected calcification. Skull: No acute calvarial abnormality. Sinuses/Orbits: No acute finding. Postoperative changes on the medial left orbital wall and orbital floor with mesh, stable. Other: None IMPRESSION: Old right frontal/parietal infarcts. No acute intracranial abnormality. Atrophy, chronic microvascular disease. Electronically Signed   By: Rolm Baptise M.D.   On: 01/12/2018 20:39   Mr  Brain Wo Contrast  Result Date: 01/13/2018 CLINICAL DATA:  LEFT-sided weakness after fall. History of stroke, alcohol abuse, hypertension, hyperlipidemia and diabetes. EXAM: MRI HEAD WITHOUT CONTRAST TECHNIQUE: Multiplanar, multiecho pulse sequences of the brain and surrounding structures were obtained without intravenous contrast. COMPARISON:  CT HEAD January 12, 2018 and MRI of the head November 29, 2017 FINDINGS: Mild motion degraded examination. INTRACRANIAL CONTENTS: Patchy reduced diffusion RIGHT frontal lobe towards the convexity  with low ADC values. RIGHT frontoparietal encephalomalacia. Scattered chronic microhemorrhages and RIGHT superficial siderosis. Mild ex vacuo dilatation RIGHT lateral ventricle. Mild parenchymal brain volume loss. No hydrocephalus. Linear T2 bright signal RIGHT cerebral peduncle seen with wallerian degeneration. Patchy supratentorial and pontine white matter FLAIR T2 hyperintensities. No midline shift, mass effect or masses. VASCULAR: Similar loss of RIGHT internal carotid artery flow void to level of the carotid terminus. SKULL AND UPPER CERVICAL SPINE: No abnormal sellar expansion. No suspicious calvarial bone marrow signal. Craniocervical junction maintained. SINUSES/ORBITS: Minimal fluid signal RIGHT petrous apex. Chronic LEFT maxillary sinusitis with atresia. Mild paranasal sinus mucosal thickening. The included ocular globes and orbital contents are non-suspicious. Status post bilateral ocular lens implants. OTHER: Patient is edentulous. IMPRESSION: 1. Mild motion degraded examination. Multifocal acute small RIGHT frontal/MCA territory infarcts. 2. Old RIGHT MCA territory infarct.  Chronically occluded RIGHT ICA. 3. Similar parenchymal brain volume loss and mild-to-moderate chronic small vessel ischemic changes. 4. Stable chronic microhemorrhages and RIGHT superficial siderosis. Electronically Signed   By: Elon Alas M.D.   On: 01/13/2018 01:43    PHYSICAL EXAM Blood pressure (!) 150/87, pulse 99, temperature 97.7 F (36.5 C), temperature source Oral, resp. rate 20, SpO2 98 %. General: Appears well-developed . Psych: Affect appropriate to situation Eyes: No scleral injection HENT: No OP obstrucion Head: Normocephalic.  Cardiovascular: Normal rate and regular rhythm.  Respiratory: Effort normal and breath sounds normal to anterior ascultation GI: Soft. No distension. There is no tenderness.  Skin: WDI   Neurological Examination Mental Status: Alert, oriented, thought content  appropriate.  Speech severely dysarthric but fluent without aphasia.  Able to follow commands without difficulty. Cranial Nerves: II: Visual fields: Impaired temporal quadrant visual fields in the left eye. III,IV, VI: ptosis not present, extra-ocular motions intact bilaterally, pupils equal, round, reactive to light and accommodation V,VII: Left facial droop, reduced sensation on the left side VIII: hearing normal bilaterally IX,X: uvula rises symmetrically, moderate to severe dysarthria  XI: bilateral shoulder shrug XII: midline tongue extension Motor: Right :  Upper extremity   5/5                                      Left:     Upper extremity   1/5 proximal and 3-/5 distal             Lower extremity   5/5                                                  Lower extremity   4-/5 Tone and bulk: Increased tone in the left upper and lower extremity Sensory: Pinprick and light touch intact throughout, bilaterally Deep Tendon Reflexes: 3+ reflex over left patella, 2+ over the right patella Plantars: Right: downgoing  Left: downgoing Cerebellar: normal finger-to-nose and normal heel-to-shin test over right upper extremity Gait: Unable to assess   ASSESSMENT/PLAN  67 y.o. male past medical history of hyperlipidemia, diabetes mellitus, coronary artery disease, alcohol abuse, congestive heart failure, COPD,CVA with left-sided weakness, chronically occluded right ICA  presents to the emergency department for 2 to 3-day history of worsening left-sided weakness and left facial numbness. Pt admits to cocaine use (UDS +).  MRI brain shows cortical infarcts in the right MCA territory concerning for stump emboli rather than watershed infarcts. Cerebral Angio planned with IR to assess if right carotid is completed occluded versus severely stenosed.  Right MCA small embolic infarcts due to chronic right ICA occlusion in the setting of cocaine abuse, continued smoking and  medication noncompliance.   MRI showed right MCA, MCA/ACA, and ACA infarcts  Cerebral Angio chronic right ICA stent occlusion  2D Echo - EF 55-60%  LDL - 98, TG 167  HgbA1c - 5.4  UDS + cocaine  VTE prophylaxis - lovenox  ASA and Plavix prior to admission, now on same  Patient counseled to be compliant with his antithrombotic medications; he admits to poor medication compliance  Ongoing aggressive stroke risk factor management  Therapy recommendations:  pending  Disposition:  Pending  History of stroke:  August 01, 2014  right frontal lobe watershed infarcts with hypotension in setting of R paraclinoid ICA stenosis   August 19, 2014  right posterior frontal infarct secondary to hypotension in setting of severe R paraclinoid stenosis  August 26, 2014  right corpus callosum infarcts in setting of R frontal infarcts in June, and new right frontal at early July, felt to be watershed in setting of hypotension due to R ICA supraclinoid near occlusion, though could not rule out embolic source.  02/2015, admitted for left-sided weakness, MRI showed scattered right MCA, MCA/ACA watershed infarcts, again felt to be secondary to right ICA supraclinoid near occlusion.  IR placed right supraclinoid ICA stent and proximal ICA stent.  Continue on aspirin 325 and Plavix 75 and Lipitor 80.  Patient discharged to CIR.  07/2016, patient presented with again right MCA patchy multifocal infarcts, MRA showed right supraclinoid ICA stent near occlusion with distal reconstitution.  However, CTA head and neck showed both right supraclinoid ICA and proximal ICA stent were patent but with taper of distal proximal ICA stent and suspected to be hemodynamically significant. Carotid Doppler showed right ICA proximal stent patent.  DSA showed 50% in-stent stenosis right ICA cavernous segment, and only 20% stenosis distal portion of proximal right ICA stent.  LDL 95 and A1c 5.8.  He was continued on aspirin and  Plavix.  10/2016 follow with Dr. Leonie Man at [facility], discontinue aspirin, continue on Plavix alone.  10/2017, admitted again for right MCA watershed infarct.  CTA head neck showed occluded right proximal ICA stent, with grossly patent flow within the right cavernous ICA stent.  LDL 97 A1c 5.9.  Patient was discharged on aspirin 325 and Plavix 75.  11/2017, patient admitted for unresponsive episode.  EEG negative.  MRI negative for acute stroke.  He was put on Keppra 500 twice daily on discharge.  Cocaine abuse   UDS positive for cocaine  Cessation education again provided  Pt is willing to quit  Tobacco abuse  Current smoker  Smoking cessation counseling provided  Pt is willing to quit  Hypertension  Stable . BP goal 130-150 given right ICA occlusion . Avoid low BP  Hyperlipidemia  Lipid lowering medication PTA:  80mg  Lipitor PO QD  LDL 98, goal < 70  Current lipid lowering medication:same  Continue statin at discharge  Other Stroke Risk Factors  ETOH use, advised to stop drinking  Obesity, recommend weight loss, diet and exercise as appropriate   Hx stroke/TIA  Other Active Problems  Seizure - continue keppra   Neurology will sign off. Please call with questions. Pt will follow up with Dr. Leonie Man at [facility] in about 4 weeks. Thanks for the consult.   Rosalin Hawking, MD PhD Stroke Neurology 01/14/2018 2:05 PM   To contact Stroke Continuity provider, please refer to [email or web address] After hours, contact General Neurology

## 2018-01-14 NOTE — Progress Notes (Signed)
OT Cancellation Note  Patient Details Name: [name] MRN: [id number] DOB: [birth year]   Cancelled Treatment:    Reason Eval/Treat Not Completed: Other (comment); spoke with RN, pt with TR band placed during procedure earlier, requests to hold therapies for today. Will follow up at a later date as schedule permits.  Lou Cal, OT Supplemental Rehabilitation Services Pager [phone] Office [phone]   Raymondo Band 01/14/2018, 3:04 PM

## 2018-01-14 NOTE — Progress Notes (Signed)
Initial Nutrition Assessment  DOCUMENTATION CODES:   Not applicable  INTERVENTION:  Continue Ensure Enlive po BID, each supplement provides 350 kcal and 20 grams of protein.  Encourage adequate PO intake.   NUTRITION DIAGNOSIS:   Increased nutrient needs related to chronic illness(CHF, COPD) as evidenced by estimated needs.  GOAL:   Patient will meet greater than or equal to 90% of their needs  MONITOR:   PO intake, Supplement acceptance, Labs, I & O's, Weight trends, Skin  REASON FOR ASSESSMENT:   Malnutrition Screening Tool    ASSESSMENT:   67 y.o. male past medical history of hyperlipidemia, diabetes mellitus, coronary artery disease, alcohol abuse, congestive heart failure, COPD,CVA with left-sided weakness, chronically occluded right ICA  presents to the emergency department for 2 to 3-day history of worsening left-sided weakness and left facial numbness. Pt admits to cocaine use (UDS +).  MRI brain shows cortical infarcts in the right MCA territory concerning for stump emboli rather than watershed infarcts.  Procedure 12/6: Cerebral angiogram  Pt unavailable, in procedure, during attempted time of visit. RD unable to obtain most recent nutrition history. No significant weight loss per weight records. Meal completion has been 100%. Pt currently has Ensure ordered. RD to continue with current orders to aid in caloric and protein needs.   Unable to complete Nutrition-Focused physical exam at this time.   Labs and medications reviewed.   Diet Order:   Diet Order            Diet Carb Modified Fluid consistency: Thin; Room service appropriate? Yes  Diet effective now              EDUCATION NEEDS:   Not appropriate for education at this time  Skin:  Skin Assessment: Reviewed RN Assessment  Last BM:  12/2  Height:   Ht Readings from Last 1 Encounters:  01/12/18 6' (1.829 m)    Weight:   Wt Readings from Last 1 Encounters:  01/12/18 98.9 kg    Ideal  Body Weight:  80.9 kg  BMI:  There is no height or weight on file to calculate BMI.  Estimated Nutritional Needs:   Kcal:  2100-2300  Protein:  105-120 grams  Fluid:  >/= 2.1 L/day    Corrin Parker, MS, RD, LDN Pager # [phone] After hours/ weekend pager # [phone]

## 2018-01-14 NOTE — Progress Notes (Signed)
Inpatient Rehabilitation Admissions Coordinator  I will follow up with patient on Monday., I can not begin insurance authorization for a possible inpt rehab admit without OT eval. Noted pt with TR band today.  Danne Baxter, RN, MSN Rehab Admissions Coordinator [phone] 01/14/2018 3:20 PM

## 2018-01-15 LAB — CBC WITH DIFFERENTIAL/PLATELET
Abs Immature Granulocytes: 0.02 10*3/uL (ref 0.00–0.07)
Basophils Absolute: 0 10*3/uL (ref 0.0–0.1)
Basophils Relative: 1 %
EOS PCT: 4 %
Eosinophils Absolute: 0.3 10*3/uL (ref 0.0–0.5)
HCT: 38.6 % — ABNORMAL LOW (ref 39.0–52.0)
Hemoglobin: 11.9 g/dL — ABNORMAL LOW (ref 13.0–17.0)
Immature Granulocytes: 0 %
Lymphocytes Relative: 20 %
Lymphs Abs: 1.3 10*3/uL (ref 0.7–4.0)
MCH: 27.4 pg (ref 26.0–34.0)
MCHC: 30.8 g/dL (ref 30.0–36.0)
MCV: 88.7 fL (ref 80.0–100.0)
Monocytes Absolute: 0.7 10*3/uL (ref 0.1–1.0)
Monocytes Relative: 12 %
Neutro Abs: 4 10*3/uL (ref 1.7–7.7)
Neutrophils Relative %: 63 %
Platelets: 165 10*3/uL (ref 150–400)
RBC: 4.35 MIL/uL (ref 4.22–5.81)
RDW: 13.8 % (ref 11.5–15.5)
WBC: 6.3 10*3/uL (ref 4.0–10.5)
nRBC: 0 % (ref 0.0–0.2)

## 2018-01-15 LAB — BASIC METABOLIC PANEL
Anion gap: 8 (ref 5–15)
BUN: 16 mg/dL (ref 8–23)
CO2: 28 mmol/L (ref 22–32)
Calcium: 9.5 mg/dL (ref 8.9–10.3)
Chloride: 108 mmol/L (ref 98–111)
Creatinine, Ser: 0.93 mg/dL (ref 0.61–1.24)
GFR calc non Af Amer: >60 mL/min (ref >60)
Glucose, Bld: 118 mg/dL — ABNORMAL HIGH (ref 70–99)
Potassium: 3.8 mmol/L (ref 3.5–5.1)
Sodium: 144 mmol/L (ref 135–145)

## 2018-01-15 LAB — GLUCOSE, CAPILLARY
Glucose-Capillary: 102 mg/dL — ABNORMAL HIGH (ref 70–99)
Glucose-Capillary: 105 mg/dL — ABNORMAL HIGH (ref 70–99)
Glucose-Capillary: 117 mg/dL — ABNORMAL HIGH (ref 70–99)
Glucose-Capillary: 147 mg/dL — ABNORMAL HIGH (ref 70–99)
Glucose-Capillary: 99 mg/dL (ref 70–99)

## 2018-01-15 NOTE — Evaluation (Signed)
Speech Language Pathology Evaluation Patient Details Name: ADLER CHARTRAND MRN: [id number] DOB: 12/12/1950 Today's Date: 01/15/2018 Time: 9892-1194 SLP Time Calculation (min) (ACUTE ONLY): 25 min  Problem List:  Patient Active Problem List   Diagnosis Date Noted  . AMS (altered mental status) 11/29/2017  . Acute encephalopathy 11/28/2017  . Left shoulder pain 09/28/2017  . Cerebral thrombosis with cerebral infarction 07/18/2016  . Acute ischemic stroke ([place])   . Left hemiplegia ([place])   . Stroke-like symptoms 07/17/2016  . Insomnia 01/29/2016  . Depression 01/29/2016  . Alcohol abuse with intoxication ([place]) 10/16/2015  . Psoriasiform dermatitis 07/12/2015  . Dementia due to another medical condition ([place]) 07/12/2015  . Urinary incontinence 03/14/2015  . Diabetes mellitus, type 2 ([place]) 03/13/2015  . Hemiparesis affecting left side as late effect of cerebrovascular accident ([place]) 02/18/2015  . Cerebrovascular accident (CVA) due to stenosis of right carotid artery ([place])   . Coronary artery disease involving native coronary artery of native heart without angina pectoris   . Tachypnea   . Thrombocytopenia ([place])   . History of stroke   . Stroke (cerebrum) ([place]) 02/06/2015  . Arterial ischemic stroke, MCA (middle cerebral artery), right, acute ([place])   . DOE (dyspnea on exertion) 11/23/2014  . PAF (paroxysmal atrial fibrillation) ([place]) 11/23/2014  . HLD (hyperlipidemia) 11/01/2014  . Tobacco use disorder 11/01/2014  . Diastolic CHF, acute on chronic (HCC) 11/01/2014  . Cerebral infarction due to stenosis of right carotid artery ([place]) 11/01/2014  . Carotid stenosis 11/01/2014  . Hyperkalemia 10/24/2014  . H/O total knee replacement 10/24/2014  . Essential hypertension   . Polysubstance abuse ([place]) 08/28/2014  . Cocaine abuse ([place])   . Intracranial carotid stenosis   . Headache around the eyes 08/27/2014  . Acute CVA (cerebrovascular accident) ([place]) 08/27/2014  . CVA (cerebral  vascular accident) ([place]) 08/26/2014  . History of DVT (deep vein thrombosis)   . Alcoholism ([place])   . Embolic stroke ([place])   . Cerebral infarction due to unspecified mechanism   . Hypotension   . AKI (acute kidney injury) ([place])   . Stroke ([place])   . CVA (cerebral infarction) 08/01/2014  . Alcohol dependence with withdrawal with complication ([place]) 17/40/8144  . DVT (deep venous thrombosis) ([place]) 02/22/2014  . Lower extremity edema 02/21/2014  . Chronic diastolic congestive heart failure ([place]) 11/21/2013  . CAD (coronary artery disease) 11/21/2013  . Alcohol abuse 11/21/2013  . GERD (gastroesophageal reflux disease) 08/10/2013  . Gout 08/10/2013  . Tobacco abuse 07/26/2013  . Trichiasis without entropion 04/16/2013  . Dizziness 04/10/2013  . Eyelid lesion 01/24/2013  . Enophthalmos due to trauma 01/19/2013  . Medial orbital wall fracture 01/19/2013  . Closed blow-out fracture of floor of orbit ([place]) 01/19/2013  . Binocular vision disorder with diplopia 01/03/2013  . Fracture of inferior orbital wall ([place]) 01/03/2013  . Fracture of orbital floor ([place]) 01/03/2013  . Pain of left eye 01/03/2013  . Peripheral vascular disease ([place]) 07/27/2012  . COPD (chronic obstructive pulmonary disease) ([place]) 04/04/2012  . HTN (hypertension) 04/04/2012   Past Medical History:  Past Medical History:  Diagnosis Date  . Alcohol abuse    H/O  . Anxiety   . Arthritis   . Asthma   . CAD (coronary artery disease) 05/27/10   Cath: severe single vessell CAD left cx midportion obtuse marginal 2 to 3.  [name] CHF (congestive heart failure) ([place])   . COPD (chronic obstructive pulmonary disease) ([place])   . COPD (chronic obstructive pulmonary  disease) ([place])   . Depression   . Diabetes mellitus, type 2 ([place]) 03/13/2015  . GERD (gastroesophageal reflux disease)   . HCAP (healthcare-associated pneumonia) 10/15/2015  . History of DVT of lower extremity   . Hyperlipemia   . Hyperlipidemia   . Hypertension   .  Myocardial infarction ([place]) 2000  . Orbital fracture 12/2012  . OSA on CPAP   . Peripheral vascular disease, unspecified ([place])    08/20/10 doppler: increase in right ABI post-op. Left ABI stable. S/P bi-fem bypass surgery  . Pneumonia 04/04/2012  . Shortness of breath   . Stroke ([place])   . Tobacco abuse    Past Surgical History:  Past Surgical History:  Procedure Laterality Date  . abdoninal ao angio & bifem angio  05/27/10   Patent graft, occluded bil stents with no retrograde flow into the hypogastric arteries. 100% occl left ant. tibial artery, 70% to 80% to 100% stenosis right superficial fem artery above adductor canal. 100 % occl right ant. tibial vessell  . Aortogram w/ PTCA  09/292003  . BACK SURGERY    . CARDIAC CATHETERIZATION  05/27/10   severe CAD left cx  . CHOLECYSTECTOMY    . ESOPHAGOGASTRIC FUNDOPLICATION    . EYE SURGERY    . FEMORAL BYPASS  08/19/10   Right Fem-Pop  . IR ANGIO INTRA EXTRACRAN SEL COM CAROTID INNOMINATE BILAT MOD SED  07/20/2016  . IR ANGIO VERTEBRAL SEL SUBCLAVIAN INNOMINATE UNI R MOD SED  07/20/2016  . IR ANGIO VERTEBRAL SEL VERTEBRAL UNI L MOD SED  07/20/2016  . IR RADIOLOGIST EVAL & MGMT  05/27/2016  . JOINT REPLACEMENT    . PERIPHERAL VASCULAR CATHETERIZATION N/A 12/10/2014   Procedure: Abdominal Aortogram;  Surgeon: Angelia Mould, MD;  Location: [place] CV [facility];  Service: Cardiovascular;  Laterality: N/A;  . RADIOLOGY WITH ANESTHESIA N/A 02/08/2015   Procedure: RADIOLOGY WITH ANESTHESIA;  Surgeon: Luanne Bras, MD;  Location: [facility];  Service: Radiology;  Laterality: N/A;  . TEE WITHOUT CARDIOVERSION N/A 08/29/2014   Procedure: TRANSESOPHAGEAL ECHOCARDIOGRAM (TEE);  Surgeon: Josue Hector, MD;  Location: [facility] ENDOSCOPY;  Service: Cardiovascular;  Laterality: N/A;  . TOTAL KNEE ARTHROPLASTY     HPI:  HILERY WINTLE is an 67 y.o. male past medical history of hyperlipidemia, diabetes mellitus, coronary artery disease, alcohol abuse,  congestive heart failure, COPD, multiple CVA's  with baseline left-sided weakness, chronically occluded right ICA Patient presented to the ED with left sided weakness and a fall.  He underwent MRI brain in the emergency department showed acute multiple infarcts in the right MCA territory in addition to old chronic right MCA infarcts. Patient has been seen by speech therapy following several CVA's. No history of dysphagia, however some cognitive deficits found and therapy was recommended at that time.    Assessment / Plan / Recommendation Clinical Impression  Patient has a history of CVA's. however was living at alone at home with a personal assistant to provide help with ADL's several hours daily. He is complaining of feeling very weak in his lower extremeities today. He feels his cognition and language are at baseline, however his speech is slurred at times. His oral motor strength and coordination were WNL and no slurring was observed throughout evaluation. He may have been compensating with a slower speech rate. MOCA was administered with a score of 25/30 (normal score being 26/30 or greater). Areas of weakness were visuospatial and memory. He reports that memory has been difficult for  him for a while, however he was still able to recall 3/5 items in the memory task. Recommend CIR upon discharge and further evaluation of higher level cognititive skills. Recommend addressing needs at the next level of care.     SLP Assessment  SLP Recommendation/Assessment: All further Speech Lanaguage Pathology  needs can be addressed in the next venue of care SLP Visit Diagnosis: Cognitive communication deficit (R41.841)    Follow Up Recommendations  Inpatient Rehab               SLP Evaluation Cognition  Overall Cognitive Status: History of cognitive impairments - at baseline Arousal/Alertness: Awake/alert Orientation Level: Oriented X4 Attention: Focused Focused Attention: Appears intact Alternating  Attention: Impaired Alternating Attention Impairment: Verbal complex Memory Impairment: Retrieval deficit;Decreased recall of new information Awareness: Appears intact Problem Solving: Appears intact Executive Function: Writer: Impaired Organizing Impairment: Verbal complex Safety/Judgment: Appears intact       Comprehension  Auditory Comprehension Overall Auditory Comprehension: Appears within functional limits for tasks assessed Yes/No Questions: Within Functional Limits Commands: Within Functional Limits Conversation: Simple Reading Comprehension Reading Status: Within funtional limits    Expression Expression Primary Mode of Expression: Verbal Verbal Expression Overall Verbal Expression: Appears within functional limits for tasks assessed Initiation: No impairment Level of Generative/Spontaneous Verbalization: Conversation Repetition: No impairment Naming: No impairment Pragmatics: No impairment Interfering Components: Premorbid deficit Written Expression Dominant Hand: Right Written Expression: Within Functional Limits   Oral / Motor  Oral Motor/Sensory Function Overall Oral Motor/Sensory Function: Within functional limits Motor Speech Overall Motor Speech: Appears within functional limits for tasks assessed Respiration: Within functional limits Phonation: Normal Resonance: Within functional limits Articulation: Within functional limitis Intelligibility: Intelligible Motor Planning: Witnin functional limits Motor Speech Errors: Not applicable Interfering Components: Premorbid status   GO                    Charlynne Cousins Kristl Morioka, MA, CCC-SLP 01/15/2018 11:53 AM

## 2018-01-15 NOTE — Plan of Care (Signed)
  Problem: Education: Goal: Knowledge of General Education information will improve Description Including pain rating scale, medication(s)/side effects and non-pharmacologic comfort measures Outcome: Progressing Note:  POC reviewed with pt.   

## 2018-01-15 NOTE — Evaluation (Signed)
Occupational Therapy Evaluation Patient Details Name: [name] MRN: [id number] DOB: May 15, 1950 Today's Date: 01/15/2018    History of Present Illness [name] is an 67 y.o. male past medical history of hyperlipidemia, diabetes mellitus, coronary artery disease, alcohol abuse, congestive heart failure, COPD,CVA with baseline left-sided weakness, chronically occluded right ICA  presents to the emergency department on 12/4 for 2 to 3-day history of worsening left-sided weakness and left facial numbness.He finally came to the ER when he fell on the floor and could not get up and had to call EMS. He underwent MRI brain in the emergency department showed acute multiple infarcts in the right MCA territory in addition to old chronic right MCA infarcts.    Clinical Impression   PTA, pt was living alone in an apartment at [address]. He has an aide who assists with bathing/dressing and IADLs 5-7 days a week for 3 hours a day. Pt able to perform BADLs and uses a cane for functional mobility. Pt currently requiring Mod A for UB ADLs, Mod-max A for LB ADLs, and Mod-Max A for functional mobility. Pt reporting worsening LUE hemiparesis. Also reporting he has multiple falls at home. Pt motivated to participating in therapy and increase occupational performance. Pt will require further acute OT facilitate safe dc. Recommend dc to CIR for further OT to optimize safety, independence with ADLs, and return to PLOF.      Follow Up Recommendations  CIR;Supervision/Assistance - 24 hour    Equipment Recommendations  Other (comment)(Defer to next venue)    Recommendations for Other Services PT consult;Rehab consult     Precautions / Restrictions Precautions Precautions: Fall Precaution Comments: reports at times multiple falls in one day Restrictions Weight Bearing Restrictions: No      Mobility Bed Mobility Overal bed mobility: Needs Assistance Bed Mobility: Supine to Sit     Supine to  sit: HOB elevated;Min assist     General bed mobility comments: Min A for trunk support. Use of bed rails.   Transfers Overall transfer level: Needs assistance Equipment used: Rolling walker (2 wheeled) Transfers: Sit to/from [facility] Sit to Stand: Mod assist         General transfer comment: Mod A to power up into standing and then gain balance. Cues for position of LLE.    Balance Overall balance assessment: Needs assistance Sitting-balance support: No upper extremity supported;Feet supported Sitting balance-Leahy Scale: Fair     Standing balance support: Single extremity supported Standing balance-Leahy Scale: Poor Standing balance comment: must have UE support to stand and physical support                           ADL either performed or assessed with clinical judgement   ADL Overall ADL's : Needs assistance/impaired Eating/Feeding: Minimal assistance;Sitting Eating/Feeding Details (indicate cue type and reason): Min A for bilateral tasks Grooming: Maximal assistance;Standing Grooming Details (indicate cue type and reason): Max A for standing balance. Min A for bialteral tasks while seated. Upper Body Bathing: Moderate assistance;Sitting   Lower Body Bathing: Maximal assistance;Moderate assistance;Sit to/from stand Lower Body Bathing Details (indicate cue type and reason): Mod-Max A for dynamic standing balance Upper Body Dressing : Moderate assistance;Sitting   Lower Body Dressing: Moderate assistance;Maximal assistance;Sitting/lateral leans Lower Body Dressing Details (indicate cue type and reason): Pt donning socks while seated at EOB with Min A for initiating socks over toes. Toilet Transfer: Ambulation;RW;Maximal Print production planner Details (indicate cue type  and reason): Mod-Max A for standing balance with pt with significant lean to left. Cues for bringing left foot forward. Max A for RW management. (simulated to recliner)          Functional mobility during ADLs: Moderate assistance;Maximal assistance;Rolling walker(feel would perform better with cane) General ADL Comments: Pt motivated to participate. Presenting with poor functional use of LUE, balance, strength, and activity tolerance.      Vision Baseline Vision/History: Wears glasses Wears Glasses: Reading only Patient Visual Report: No change from baseline;Other (comment)(reportings blurry/double vision "at some point") Vision Assessment?: Yes;Vision impaired- to be further tested in functional context Eye Alignment: Impaired (comment)(left eye with slight disconjugate) Ocular Range of Motion: Within Functional Limits Alignment/Gaze Preference: Within Defined Limits Tracking/Visual Pursuits: Decreased smoothness of horizontal tracking;Decreased smoothness of vertical tracking Convergence: Impaired - to be further tested in functional context Additional Comments: Poor smooth tracking and slight disconjugate of left eye. Will be further tested     Perception     Praxis      Pertinent Vitals/Pain Pain Assessment: Faces Faces Pain Scale: Hurts little more Pain Location: L hip Pain Descriptors / Indicators: Throbbing Pain Intervention(s): Monitored during session;Limited activity within patient's tolerance;Repositioned     Hand Dominance Right   Extremity/Trunk Assessment Upper Extremity Assessment Upper Extremity Assessment: LUE deficits/detail LUE Deficits / Details: Pt without active movement in LUE and noted increased time with ROM. Reporting numbness and tingling in his left hand. Pt stating "it feels like a brick." Pt reports was able to lift some and able to make a light fist prior to this episode LUE Sensation: decreased light touch LUE Coordination: decreased fine motor;decreased gross motor   Lower Extremity Assessment Lower Extremity Assessment: Defer to PT evaluation LLE Deficits / Details: AAROM WFL, strength hip flexion 3-/5,  note extensor tone, but able to isolate movement in ankle, could not flex knee without assist LLE Sensation: decreased light touch       Communication Communication Communication: No difficulties   Cognition Arousal/Alertness: Awake/alert Behavior During Therapy: WFL for tasks assessed/performed Overall Cognitive Status: No family/caregiver present to determine baseline cognitive functioning                                 General Comments: Pt requiring increased time and cues. Able to follow simple commands and answer simple questions. Pt motivated to participate and increased quality of life.    General Comments       Exercises Exercises: General Upper Extremity General Exercises - Upper Extremity Shoulder Flexion: PROM;Left;10 reps;Seated Shoulder Extension: PROM;Left;10 reps;Seated Elbow Flexion: Left;10 reps;PROM;Seated Elbow Extension: PROM;Left;10 reps;Seated Wrist Flexion: PROM;Left;10 reps;Seated Wrist Extension: PROM;Left;10 reps;Seated   Shoulder Instructions      Home Living Family/patient expects to be discharged to:: Private residence Living Arrangements: Alone Available Help at Discharge: Personal care attendant(3 hours M-F some times S&S) Type of Home: Apartment([address]) Home Access: Elevator     Home Layout: One level     Bathroom Shower/Tub: Teacher, early years/pre: Handicapped height     Home Equipment: [place] - single point;Shower seat;Walker - 4 wheels;Grab bars - tub/shower;Bedside commode;Wheelchair - power   Additional Comments: had a manual w/c but got lost in ALF in [place] and they lost the power cord for electric w/c      Prior Functioning/Environment Level of Independence: Needs assistance  Gait / Transfers Assistance Needed: walks with cane  ADL's /  Homemaking Assistance Needed: needs assist; aide helps with bath, clothing, medications, meals and takes to appointments Communication / Swallowing Assistance  Needed: HOH          OT Problem List: Decreased strength;Decreased range of motion;Decreased activity tolerance;Impaired balance (sitting and/or standing);Impaired vision/perception;Decreased cognition;Decreased coordination;Decreased safety awareness;Decreased knowledge of use of DME or AE;Decreased knowledge of precautions;Impaired UE functional use;Pain      OT Treatment/Interventions: Self-care/ADL training;Therapeutic exercise;Energy conservation;DME and/or AE instruction;Therapeutic activities;Patient/family education    OT Goals(Current goals can be found in the care plan section) Acute Rehab OT Goals Patient Stated Goal: to return home OT Goal Formulation: With patient Time For Goal Achievement: 01/29/18 Potential to Achieve Goals: Good  OT Frequency: Min 2X/week   Barriers to D/C:            Co-evaluation              AM-PAC OT "6 Clicks" Daily Activity     Outcome Measure Help from another person eating meals?: A Little Help from another person taking care of personal grooming?: A Little Help from another person toileting, which includes using toliet, bedpan, or urinal?: A Lot Help from another person bathing (including washing, rinsing, drying)?: A Lot Help from another person to put on and taking off regular upper body clothing?: A Lot Help from another person to put on and taking off regular lower body clothing?: A Lot 6 Click Score: 14   End of Session Equipment Utilized During Treatment: Gait belt;Rolling walker Nurse Communication: Mobility status;Other (comment)(pt scratched himself causing wound and bandage placed)  Activity Tolerance: Patient tolerated treatment well Patient left: in chair;with call bell/phone within reach;with chair alarm set  OT Visit Diagnosis: Unsteadiness on feet (R26.81);Other abnormalities of gait and mobility (R26.89);Muscle weakness (generalized) (M62.81);Other symptoms and signs involving cognitive function;Pain;History of  falling (Z91.81);Hemiplegia and hemiparesis Hemiplegia - Right/Left: Left Hemiplegia - dominant/non-dominant: Non-Dominant Hemiplegia - caused by: Cerebral infarction Pain - Right/Left: Left Pain - part of body: Leg                Time: 5277-8242 OT Time Calculation (min): 27 min Charges:  OT General Charges $OT Visit: 1 Visit OT Evaluation $OT Eval Moderate Complexity: 1 Mod OT Treatments $Self Care/Home Management : 8-22 mins  Sahana Boyland MSOT, OTR/L Acute Rehab Pager: [phone] Office: [place] 01/15/2018, 9:32 AM

## 2018-01-15 NOTE — Progress Notes (Signed)
PT Progress Note for Charges    01/15/18 1200  PT General Charges  $$ ACUTE PT VISIT 1 Visit  PT Treatments  $Therapeutic Activity 23-37 mins  Sherie Don, [place], DPT  Acute Rehabilitation Services Pager [phone] Office [phone]

## 2018-01-15 NOTE — Progress Notes (Signed)
Physical Therapy Treatment Patient Details Name: [name] MRN: [id number] DOB: 10-05-1950 Today's Date: 01/15/2018    History of Present Illness Pt is an 67 y.o. male past medical history of hyperlipidemia, diabetes mellitus, coronary artery disease, alcohol abuse, congestive heart failure, COPD,CVA with baseline left-sided weakness, chronically occluded right ICA  presents to the emergency department on 12/4 for 2 to 3-day history of worsening left-sided weakness and left facial numbness.He finally came to the ER when he fell on the floor and could not get up and had to call EMS. He underwent MRI brain in the emergency department showed acute multiple infarcts in the right MCA territory in addition to old chronic right MCA infarcts.    PT Comments    Pt demonstrates additional assistance required for transfers compared to previous session, with decreased functional mobility noted. Pt is currently mod-max A x2 for transfers, with constant blocking on the LLE during sit to stand. Pt demonstrates decreased midline orientation during standing, but is able to correct back to midline with multimodal cueing. Pt states continued L hip pain, as well as decreased proprioception of the LLE. Pt denies any lightheadedness or dizziness throughout. Current recommendation of CIR is appropriate at this time. Pt would benefit from continued acute PT in order to improve balance, strength, and functional mobility.     Follow Up Recommendations  CIR     Equipment Recommendations  None recommended by PT    Recommendations for Other Services       Precautions / Restrictions Precautions Precautions: Fall Precaution Comments: reports at times multiple falls in one day Restrictions Weight Bearing Restrictions: No    Mobility  Bed Mobility Overal bed mobility: (Deferred, sitting in chair.  )             Transfers Overall transfer level: Needs assistance Equipment used: Straight cane;2 person  hand held assist Transfers: Sit to/from Stand Sit to Stand: Mod assist;Max assist;+2 physical assistance         General transfer comment: Pt completed sit to stand 5 times. Pt required multimodal cueing throughout for sequencing. Pt completed sit to stand 2 times with max A for power initiation and stability, with constant blocking of LLE. Pt then required use of cane on R side during standing for stability. Pt also able to complete sit to stand 3 times with mod-max A x2 with constant blocking of LLE  Ambulation/Gait                 Stairs             Wheelchair Mobility    Modified Rankin (Stroke Patients Only) Modified Rankin (Stroke Patients Only) Pre-Morbid Rankin Score: Moderately severe disability Modified Rankin: Moderately severe disability     Balance Overall balance assessment: Needs assistance Sitting-balance support: No upper extremity supported;Feet supported Sitting balance-Leahy Scale: Fair     Standing balance support: During functional activity;Single extremity supported Standing balance-Leahy Scale: Poor Standing balance comment: Pt requires UE support during standing balance, in addition to mod-max support for stability. Pt demonstrates initial L lateral lean, but is able to find midline after multimodal cueing.                             Cognition Arousal/Alertness: Awake/alert Behavior During Therapy: WFL for tasks assessed/performed Overall Cognitive Status: History of cognitive impairments - at baseline Area of Impairment: Problem solving;Awareness  Awareness: Emergent Problem Solving: Slow processing;Decreased initiation;Difficulty sequencing;Requires verbal cues;Requires tactile cues General Comments: Pt requires multimodal cueing throughout. Pt is very motivated to participate in PT.       Exercises  Other Exercises Other Exercises: sit to stand 5 times from chair    General  Comments        Pertinent Vitals/Pain Pain Assessment: No/denies pain Pain Score: 0-No pain Faces Pain Scale: Hurts little more Pain Location: L hip Pain Descriptors / Indicators: Throbbing Pain Intervention(s): Monitored during session    Home Living     Available Help at Discharge: Personal care attendant Type of Home: Apartment              Prior Function            PT Goals (current goals can now be found in the care plan section) Acute Rehab PT Goals Patient Stated Goal: to return home PT Goal Formulation: With patient Time For Goal Achievement: 01/27/18 Potential to Achieve Goals: Good Progress towards PT goals: Progressing toward goals    Frequency    Min 4X/week      PT Plan Equipment recommendations need to be updated    Co-evaluation              AM-PAC PT "6 Clicks" Mobility   Outcome Measure  Help needed turning from your back to your side while in a flat bed without using bedrails?: A Little Help needed moving from lying on your back to sitting on the side of a flat bed without using bedrails?: A Little Help needed moving to and from a bed to a chair (including a wheelchair)?: A Lot Help needed standing up from a chair using your arms (e.g., wheelchair or bedside chair)?: A Lot Help needed to walk in hospital room?: Total Help needed climbing 3-5 steps with a railing? : Total 6 Click Score: 12    End of Session Equipment Utilized During Treatment: Gait belt Activity Tolerance: Patient tolerated treatment well Patient left: in chair;with chair alarm set;with call bell/phone within reach Nurse Communication: Mobility status PT Visit Diagnosis: Unsteadiness on feet (R26.81);Other abnormalities of gait and mobility (R26.89);Difficulty in walking, not elsewhere classified (R26.2)     Time: 1023-1050 PT Time Calculation (min) (ACUTE ONLY): 27 min  Charges:  $Therapeutic Activity: 23-37 mins                     Wandra Feinstein,  SPT Acute Rehab [phone] (pager) [phone] (office)    Sabina Beavers 01/15/2018, 1:04 PM

## 2018-01-15 NOTE — Progress Notes (Signed)
PROGRESS NOTE    [name]  VOJ:[id number] DOB: September 23, 1950 DOA: 01/12/2018 PCP: Charlott Rakes, MD   Brief Narrative:  Patient is a 67 year old male with past medical history of coronary disease, chronic diastolic CHF, PVD, OSA, hypertension, diabetes, COPD with active tobacco abuse, recurrent hospitalization for embolic stroke, last hospitalization on September with multifocal right-sided infarct suspected due to chronically occluded right ICA  presented to the emergency department with complaints of 2-day history of increasing left-sided weakness, fall on the floor, inability to move his left lower extremity, left facial numbness. He has minimal left-sided residual hemiparesis from the previous stroke.  MRI/MRI of the brain showed multiple acute infarcts in the right MCA territory in addition to his chronic right MCA infarct.  TPA was not given as the patient was out of therapeutic window.  Neurology consulted.  Underwent  carotid/cerebral angiogram by IR which showed chronic right ICA stent occlusion .Work up complete. He is waiting for bed in CIR.  Assessment & Plan:   Principal Problem:   CVA (cerebral vascular accident) ([place]) Active Problems:   COPD (chronic obstructive pulmonary disease) ([place])   Chronic diastolic congestive heart failure ([place])   Alcoholism ([place])   Embolic stroke ([place])   Cocaine abuse ([place])   Intracranial carotid stenosis   Polysubstance abuse ([place])   Tobacco use disorder   Coronary artery disease involving native coronary artery of native heart without angina pectoris   Diabetes mellitus, type 2 ([place])  Acute embolic stroke: He has significant risk factors.  He has recurrent stroke,  ongoing tobacco abuse, cocaine abuse.  Other risks factors include hypertension, diabetes. He is also non complaint.  Neurology evaluated him.  Continue aspirin, Plavix and statin. LDL of 98, hemoglobin A1 C 5.4. Allow permissive hypertension.   2D echocardiogram showed  ejection fraction of 55 to 99%, grade 1 diastolic dysfunction. Physical therapy evaluated him and recommended inpatient rehab.  He has been discharged to skilled nursing facility in the past after his previous stroke. Carotid/cerebral angiogram chronic right ICA stent occlusion.  Polysubstance abuse/alcohol abuse: UDS positive for cocaine.  Counseled for cessation.  Continue to monitor the CIWA .  Order nicotine patch  Diabetes mellitus type 2: Not on any medications at home.  Hemoglobin A1c is good.  Continue sliding scale insulin  Chronic diastolic CHF/coronary disease: Currently compensated.  Continue aspirin, statin.  Also on Coreg at home.  COPD: Continue home inhalers.  Currently stable.  Fall with left-sided chest pain: No signs of rib fracture on chest x-ray.  PT evaluation done.  Right upper lobe nodule: Noncontrast CT is recommended as an outpatient.  Has been ordered.  History of seizure disorder: Continue Keppra  Nutrition Problem: Increased nutrient needs Etiology: chronic illness(CHF, COPD)      DVT prophylaxis: Lovenox  Code Status: DNR Family Communication: None present at the bedside Disposition Plan: CIR/SNF after full work-up   Consultants: Neurology, IR  Procedures: None  Antimicrobials: None  Subjective: Patient seen and examined the bedside this morning. He remains hemodynamically stable.  Remains significantly weak on left upper extremity.  No complaints of chest pain or shortness of breath.Participated with PT today.  Objective: Vitals:   01/14/18 2332 01/15/18 0432 01/15/18 0800 01/15/18 0859  BP: (!) 123/57 (!) 127/59 (!) 141/84   Pulse: 87 80 79 82  Resp: 18 18 18 18   Temp: 98.4 F (36.9 C) 98.2 F (36.8 C) 98.3 F (36.8 C)   TempSrc: Oral Oral Oral   SpO2: 93%  94% 96% 93%    Intake/Output Summary (Last 24 hours) at 01/15/2018 1103 Last data filed at 01/14/2018 1800 Gross per 24 hour  Intake -  Output 650 ml  Net -650 ml   There  were no vitals filed for this visit.  Examination:  General exam: Appears calm and comfortable ,Not in distress,average built HEENT:PERRL,Oral mucosa moist, Ear/Nose normal on gross exam Respiratory system: Bilateral equal air entry, normal vesicular breath sounds, no wheezes or crackles  Cardiovascular system: S1 & S2 heard, RRR. No JVD, murmurs, rubs, gallops or clicks. No pedal edema. Gastrointestinal system: Abdomen is nondistended, soft and nontender. No organomegaly or masses felt. Normal bowel sounds heard. Central nervous system: Alert and oriented.  Power: Upper: 0/5 on the proximal left, 2-3/5 on the distal,5/5/on the right Lower: 4/5 on the left, 5/5 on the right  Extremities: No edema, no clubbing ,no cyanosis, distal peripheral pulses palpable. Skin: No rashes, lesions or ulcers,no icterus ,no pallor MSK: Normal muscle bulk,tone ,power Psychiatry: Judgement and insight appear normal. Mood & affect appropriate.     Data Reviewed: I have personally reviewed following labs and imaging studies  CBC: Recent Labs  Lab 01/12/18 2111 01/12/18 2125 01/15/18 0529  WBC 6.7  --  6.3  NEUTROABS 4.4  --  4.0  HGB 13.4 15.3 11.9*  HCT 45.1 45.0 38.6*  MCV 90.6  --  88.7  PLT 220  --  130   Basic Metabolic Panel: Recent Labs  Lab 01/12/18 2111 01/12/18 2125 01/15/18 0529  NA 144 144 144  K 3.7 4.1 3.8  CL 107 108 108  CO2 33*  --  28  GLUCOSE 121* 115* 118*  BUN 13 18 16   CREATININE 1.08 1.00 0.93  CALCIUM 9.6  --  9.5   GFR: Estimated Creatinine Clearance: 92.6 mL/min (by C-G formula based on SCr of 0.93 mg/dL). Liver Function Tests: Recent Labs  Lab 01/12/18 2111  AST 14*  ALT 13  ALKPHOS 78  BILITOT 0.6  PROT 6.5  ALBUMIN 3.6   No results for input(s): LIPASE, AMYLASE in the last 168 hours. No results for input(s): AMMONIA in the last 168 hours. Coagulation Profile: Recent Labs  Lab 01/12/18 2111  INR 1.01   Cardiac Enzymes: No results for  input(s): CKTOTAL, CKMB, CKMBINDEX, TROPONINI in the last 168 hours. BNP (last 3 results) No results for input(s): PROBNP in the last 8760 hours. HbA1C: Recent Labs    01/12/18 1428 01/13/18 0549  HGBA1C 5.9 5.4   CBG: Recent Labs  Lab 01/13/18 2254 01/14/18 0630 01/14/18 1659 01/14/18 2143 01/15/18 0608  GLUCAP 97 126* 147* 102* 105*   Lipid Profile: Recent Labs    01/13/18 0549  CHOL 161  HDL 30*  LDLCALC 98  TRIG 167*  CHOLHDL 5.4   Thyroid Function Tests: No results for input(s): TSH, T4TOTAL, FREET4, T3FREE, THYROIDAB in the last 72 hours. Anemia Panel: No results for input(s): VITAMINB12, FOLATE, FERRITIN, TIBC, IRON, RETICCTPCT in the last 72 hours. Sepsis Labs: No results for input(s): PROCALCITON, LATICACIDVEN in the last 168 hours.  No results found for this or any previous visit (from the past 240 hour(s)).       Radiology Studies: No results found.      Scheduled Meds: . allopurinol  300 mg Oral Daily  . aspirin  300 mg Rectal Daily   Or  . aspirin  325 mg Oral Daily  . atorvastatin  80 mg Oral q1800  . clopidogrel  75  mg Oral Daily  . DULoxetine  60 mg Oral Daily  . enoxaparin (LOVENOX) injection  40 mg Subcutaneous Q24H  . feeding supplement (ENSURE ENLIVE)  237 mL Oral BID BM  . folic acid  1 mg Oral Daily  . insulin aspart  0-15 Units Subcutaneous TID WC  . insulin aspart  0-5 Units Subcutaneous QHS  . levETIRAcetam  500 mg Oral BID  . memantine  10 mg Oral BID  . mometasone-formoterol  2 puff Inhalation BID  . multivitamin with minerals  1 tablet Oral Daily  . nicotine  21 mg Transdermal Daily  . pantoprazole  40 mg Oral Daily  . pregabalin  150 mg Oral BID  . rOPINIRole  0.5 mg Oral QHS  . thiamine  100 mg Oral Daily   Or  . thiamine  100 mg Intravenous Daily   Continuous Infusions:   LOS: 2 days    Time spent: 35 mins.More than 50% of that time was spent in counseling and/or coordination of care.      Shelly Coss, MD [facility] Pager [phone]  If 7PM-7AM, please contact night-coverage [email or web address] Password [email or web address] 01/15/2018, 11:03 AM

## 2018-01-16 LAB — GLUCOSE, CAPILLARY
Glucose-Capillary: 106 mg/dL — ABNORMAL HIGH (ref 70–99)
Glucose-Capillary: 124 mg/dL — ABNORMAL HIGH (ref 70–99)
Glucose-Capillary: 100 mg/dL — ABNORMAL HIGH (ref 70–99)
Glucose-Capillary: 107 mg/dL — ABNORMAL HIGH (ref 70–99)

## 2018-01-16 NOTE — Progress Notes (Signed)
PROGRESS NOTE    [name]  NID:[id number] DOB: 07-Jul-1950 DOA: 01/12/2018 PCP: Charlott Rakes, MD   Brief Narrative:  Patient is a 67 year old male with past medical history of coronary disease, chronic diastolic CHF, PVD, OSA, hypertension, diabetes, COPD with active tobacco abuse, recurrent hospitalization for embolic stroke, last hospitalization on September with multifocal right-sided infarct suspected due to chronically occluded right ICA  presented to the emergency department with complaints of 2-day history of increasing left-sided weakness, fall on the floor, inability to move his left lower extremity, left facial numbness. He has minimal left-sided residual hemiparesis from the previous stroke.  MRI/MRI of the brain showed multiple acute infarcts in the right MCA territory in addition to his chronic right MCA infarct.  TPA was not given as the patient was out of therapeutic window.  Neurology consulted.  Underwent  carotid/cerebral angiogram by IR which showed chronic right ICA stent occlusion .Work up complete. He is waiting for bed in CIR.  Assessment & Plan:   Principal Problem:   CVA (cerebral vascular accident) ([place]) Active Problems:   COPD (chronic obstructive pulmonary disease) ([place])   Chronic diastolic congestive heart failure ([place])   Alcoholism ([place])   Embolic stroke ([place])   Cocaine abuse ([place])   Intracranial carotid stenosis   Polysubstance abuse ([place])   Tobacco use disorder   Coronary artery disease involving native coronary artery of native heart without angina pectoris   Diabetes mellitus, type 2 ([place])  Acute embolic stroke: He has significant risk factors.  He has recurrent stroke,  ongoing tobacco abuse, cocaine abuse.  Other risks factors include hypertension, diabetes. He is also non complaint.  Neurology evaluated him.  Continue aspirin, Plavix and statin. LDL of 98, hemoglobin A1 C 5.4. Allow permissive hypertension.   2D echocardiogram showed  ejection fraction of 55 to 14%, grade 1 diastolic dysfunction. Physical therapy evaluated him and recommended inpatient rehab.  He has been discharged to skilled nursing facility in the past after his previous stroke. Carotid/cerebral angiogram chronic right ICA stent occlusion.  Polysubstance abuse/alcohol abuse: UDS positive for cocaine.  Counseled for cessation.  Continue to monitor the CIWA .  Ordered nicotine patch  Diabetes mellitus type 2: Not on any medications at home.  Hemoglobin A1c is good.  Continue sliding scale insulin  Chronic diastolic CHF/coronary disease: Currently compensated.  Continue aspirin, statin.  Also on Coreg at home.  COPD: Continue home inhalers.  Currently stable.  Fall with left-sided chest pain: No signs of rib fracture on chest x-ray.  PT evaluation done.  Right upper lobe nodule: Noncontrast CT is recommended as an outpatient.  Has been ordered.  History of seizure disorder: Continue Keppra  Nutrition Problem: Increased nutrient needs Etiology: chronic illness(CHF, COPD)    Patient is still for discharge to CIR soon as the bed is available.  DVT prophylaxis: Lovenox  Code Status: DNR Family Communication: None present at the bedside Disposition Plan: CIR as soon as bed is available   Consultants: Neurology, IR  Procedures: None  Antimicrobials: None  Subjective: Patient seen and examined the bedside this morning. He remains hemodynamically stable.  Remains significantly weak on left upper extremity.  No complaints of chest pain or shortness of breath.No new events from yesterday.  Objective: Vitals:   01/15/18 2340 01/16/18 0425 01/16/18 0804 01/16/18 0855  BP: (!) 149/63 131/67 (!) 149/70   Pulse: 76 78 67   Resp: 18 18 17    Temp: 98 F (36.7 C) 98.2 F (36.8  C) 97.7 F (36.5 C)   TempSrc: Oral Oral Oral   SpO2: 94% 94% 92% 93%    Intake/Output Summary (Last 24 hours) at 01/16/2018 1123 Last data filed at 01/16/2018 0842 Gross  per 24 hour  Intake -  Output 975 ml  Net -975 ml   There were no vitals filed for this visit.  Examination:  General exam: Appears calm and comfortable ,Not in distress,average built HEENT:PERRL,Oral mucosa moist, Ear/Nose normal on gross exam Respiratory system: Bilateral equal air entry, normal vesicular breath sounds, no wheezes or crackles  Cardiovascular system: S1 & S2 heard, RRR. No JVD, murmurs, rubs, gallops or clicks. No pedal edema. Gastrointestinal system: Abdomen is nondistended, soft and nontender. No organomegaly or masses felt. Normal bowel sounds heard. Central nervous system: Alert and oriented.  Power: Upper: 0/5 on the proximal left, 2-3/5 on the distal,5/5/on the right Lower: 4/5 on the left, 5/5 on the right  Extremities: No edema, no clubbing ,no cyanosis, distal peripheral pulses palpable. Skin: No rashes, lesions or ulcers,no icterus ,no pallor MSK: Normal muscle bulk,tone ,power Psychiatry: Judgement and insight appear normal. Mood & affect appropriate.     Data Reviewed: I have personally reviewed following labs and imaging studies  CBC: Recent Labs  Lab 01/12/18 2111 01/12/18 2125 01/15/18 0529  WBC 6.7  --  6.3  NEUTROABS 4.4  --  4.0  HGB 13.4 15.3 11.9*  HCT 45.1 45.0 38.6*  MCV 90.6  --  88.7  PLT 220  --  401   Basic Metabolic Panel: Recent Labs  Lab 01/12/18 2111 01/12/18 2125 01/15/18 0529  NA 144 144 144  K 3.7 4.1 3.8  CL 107 108 108  CO2 33*  --  28  GLUCOSE 121* 115* 118*  BUN 13 18 16   CREATININE 1.08 1.00 0.93  CALCIUM 9.6  --  9.5   GFR: Estimated Creatinine Clearance: 92.6 mL/min (by C-G formula based on SCr of 0.93 mg/dL). Liver Function Tests: Recent Labs  Lab 01/12/18 2111  AST 14*  ALT 13  ALKPHOS 78  BILITOT 0.6  PROT 6.5  ALBUMIN 3.6   No results for input(s): LIPASE, AMYLASE in the last 168 hours. No results for input(s): AMMONIA in the last 168 hours. Coagulation Profile: Recent Labs  Lab  01/12/18 2111  INR 1.01   Cardiac Enzymes: No results for input(s): CKTOTAL, CKMB, CKMBINDEX, TROPONINI in the last 168 hours. BNP (last 3 results) No results for input(s): PROBNP in the last 8760 hours. HbA1C: No results for input(s): HGBA1C in the last 72 hours. CBG: Recent Labs  Lab 01/15/18 0608 01/15/18 1205 01/15/18 1655 01/15/18 2151 01/16/18 0616  GLUCAP 105* 117* 147* 99 106*   Lipid Profile: No results for input(s): CHOL, HDL, LDLCALC, TRIG, CHOLHDL, LDLDIRECT in the last 72 hours. Thyroid Function Tests: No results for input(s): TSH, T4TOTAL, FREET4, T3FREE, THYROIDAB in the last 72 hours. Anemia Panel: No results for input(s): VITAMINB12, FOLATE, FERRITIN, TIBC, IRON, RETICCTPCT in the last 72 hours. Sepsis Labs: No results for input(s): PROCALCITON, LATICACIDVEN in the last 168 hours.  No results found for this or any previous visit (from the past 240 hour(s)).       Radiology Studies: No results found.      Scheduled Meds: . allopurinol  300 mg Oral Daily  . aspirin  300 mg Rectal Daily   Or  . aspirin  325 mg Oral Daily  . atorvastatin  80 mg Oral q1800  . clopidogrel  75 mg Oral Daily  . DULoxetine  60 mg Oral Daily  . enoxaparin (LOVENOX) injection  40 mg Subcutaneous Q24H  . feeding supplement (ENSURE ENLIVE)  237 mL Oral BID BM  . folic acid  1 mg Oral Daily  . insulin aspart  0-15 Units Subcutaneous TID WC  . insulin aspart  0-5 Units Subcutaneous QHS  . levETIRAcetam  500 mg Oral BID  . memantine  10 mg Oral BID  . mometasone-formoterol  2 puff Inhalation BID  . multivitamin with minerals  1 tablet Oral Daily  . nicotine  21 mg Transdermal Daily  . pantoprazole  40 mg Oral Daily  . pregabalin  150 mg Oral BID  . rOPINIRole  0.5 mg Oral QHS  . thiamine  100 mg Oral Daily   Or  . thiamine  100 mg Intravenous Daily   Continuous Infusions:   LOS: 3 days    Time spent: 35 mins.More than 50% of that time was spent in counseling  and/or coordination of care.      Shelly Coss, MD [facility] Pager [phone]  If 7PM-7AM, please contact night-coverage [email or web address] Password [facility] 01/16/2018, 11:23 AM

## 2018-01-17 ENCOUNTER — Encounter (HOSPITAL_COMMUNITY): Payer: Self-pay | Admitting: Interventional Radiology

## 2018-01-17 LAB — GLUCOSE, CAPILLARY
Glucose-Capillary: 101 mg/dL — ABNORMAL HIGH (ref 70–99)
Glucose-Capillary: 136 mg/dL — ABNORMAL HIGH (ref 70–99)
Glucose-Capillary: 116 mg/dL — ABNORMAL HIGH (ref 70–99)
Glucose-Capillary: 114 mg/dL — ABNORMAL HIGH (ref 70–99)

## 2018-01-17 NOTE — Progress Notes (Signed)
Inpatient Rehabilitation Admissions Coordinator  I met with patient at bedside to discuss goals and expectations of an inpt rehab admit. He prefers inpt rehab rather than SNF. He was recently d/c'd to [facility] and then to [facility]. Has been home for about three weeks with the assistance of his PCS aide, [name], which is also his NOK. I will begin insurance authorization with [facility] Medicare for a possible inpt rehab admit. I will follow up tomorrow.  Danne Baxter, RN, MSN Rehab Admissions Coordinator [phone] 01/17/2018 12:49 PM

## 2018-01-17 NOTE — Progress Notes (Addendum)
PROGRESS NOTE    [name]  ACZ:[id number] DOB: Jan 29, 1951 DOA: 01/12/2018 PCP: Charlott Rakes, MD   Brief Narrative:  Patient is a 67 year old male with past medical history of coronary disease, chronic diastolic CHF, PVD, OSA, hypertension, diabetes, COPD with active tobacco abuse, recurrent hospitalization for embolic stroke, last hospitalization on September with multifocal right-sided infarct suspected due to chronically occluded right ICA  presented to the emergency department with complaints of 2-day history of increasing left-sided weakness, fall on the floor, inability to move his left lower extremity, left facial numbness. He has minimal left-sided residual hemiparesis from the previous stroke.  MRI/MRI of the brain showed multiple acute infarcts in the right MCA territory in addition to his chronic right MCA infarct.  TPA was not given as the patient was out of therapeutic window.  Neurology consulted.  Underwent  carotid/cerebral angiogram by IR which showed chronic right ICA stent occlusion .Work up complete. He is waiting for bed in CIR.  Assessment & Plan:   Principal Problem:   CVA (cerebral vascular accident) ([place]) Active Problems:   COPD (chronic obstructive pulmonary disease) ([place])   Chronic diastolic congestive heart failure ([place])   Alcoholism ([place])   Embolic stroke ([place])   Cocaine abuse ([place])   Intracranial carotid stenosis   Polysubstance abuse ([employer])   Tobacco use disorder   Coronary artery disease involving native coronary artery of native heart without angina pectoris   Diabetes mellitus, type 2 ([place])  Acute embolic stroke: He has significant risk factors.  He has recurrent stroke,  ongoing tobacco abuse, cocaine abuse.  Other risks factors include hypertension, diabetes. He is also non complaint. Neurology evaluated him.  Continue aspirin, Plavix and statin. LDL of 98, hemoglobin A1 C 5.4. 2D echocardiogram showed ejection fraction of 55 to 60%, grade 1  diastolic dysfunction. Physical therapy evaluated him and recommended inpatient rehab.  He has been discharged to skilled nursing facility in the past after his previous stroke. Carotid/cerebral angiogram chronic right ICA stent occlusion. Stroke work-up completed.  Polysubstance abuse/alcohol abuse: UDS positive for cocaine.  Counseled for cessation.  Continue to monitor the CIWA .  Ordered nicotine patch  Diabetes mellitus type 2: Not on any medications at home.  Hemoglobin A1c is good.  Continue sliding scale insulin  Chronic diastolic CHF/coronary disease: Currently compensated.  Continue aspirin, statin.  Lasix and Coreg restarted.  COPD: Continue home inhalers.  Currently stable.  Fall with left-sided chest pain: No signs of rib fracture on chest x-ray.  PT evaluation done.  Right upper lobe nodule: Noncontrast CT is recommended as an outpatient.  Has been ordered.  History of seizure disorder: Continue Keppra  Nutrition Problem: Increased nutrient needs Etiology: chronic illness(CHF, COPD)    Patient is stable for discharge to CIR soon as the bed is available.  DVT prophylaxis: Lovenox  Code Status: DNR Family Communication: None present at the bedside Disposition Plan: CIR as soon as bed is available   Consultants: Neurology, IR  Procedures: None  Antimicrobials: None  Subjective: Patient seen and examined the bedside this morning. He remains hemodynamically stable.  Remains significantly weak on left upper extremity.  No complaints of chest pain or shortness of breath.No new events from yesterday.Stable for discharge as soon as the bed is available.  Objective: Vitals:   01/17/18 0721 01/17/18 0854 01/17/18 1101 01/17/18 1200  BP: 133/61  (!) 151/69   Pulse: 78  85 97  Resp: 18  18   Temp: (!) 97.5 F (  36.4 C)  97.8 F (36.6 C)   TempSrc: Oral  Oral   SpO2: 93% 94% 95%   Weight:      Height:        Intake/Output Summary (Last 24 hours) at 01/17/2018  1432 Last data filed at 01/17/2018 1300 Gross per 24 hour  Intake 480 ml  Output 1050 ml  Net -570 ml   Filed Weights   01/16/18 2200  Weight: 98.9 kg    Examination:  General exam: Appears calm and comfortable ,Not in distress,average built HEENT:PERRL,Oral mucosa moist, Ear/Nose normal on gross exam Respiratory system: Bilateral equal air entry, normal vesicular breath sounds, no wheezes or crackles  Cardiovascular system: S1 & S2 heard, RRR. No JVD, murmurs, rubs, gallops or clicks. No pedal edema. Gastrointestinal system: Abdomen is nondistended, soft and nontender. No organomegaly or masses felt. Normal bowel sounds heard. Central nervous system: Alert and oriented.  Power: Upper: 0/5 on the proximal left, 2-3/5 on the distal,5/5/on the right Lower: 4/5 on the left, 5/5 on the right  Extremities: No edema, no clubbing ,no cyanosis, distal peripheral pulses palpable. Skin: No rashes, lesions or ulcers,no icterus ,no pallor MSK: Normal muscle bulk,tone ,power Psychiatry: Judgement and insight appear normal. Mood & affect appropriate.     Data Reviewed: I have personally reviewed following labs and imaging studies  CBC: Recent Labs  Lab 01/12/18 2111 01/12/18 2125 01/15/18 0529  WBC 6.7  --  6.3  NEUTROABS 4.4  --  4.0  HGB 13.4 15.3 11.9*  HCT 45.1 45.0 38.6*  MCV 90.6  --  88.7  PLT 220  --  856   Basic Metabolic Panel: Recent Labs  Lab 01/12/18 2111 01/12/18 2125 01/15/18 0529  NA 144 144 144  K 3.7 4.1 3.8  CL 107 108 108  CO2 33*  --  28  GLUCOSE 121* 115* 118*  BUN 13 18 16   CREATININE 1.08 1.00 0.93  CALCIUM 9.6  --  9.5   GFR: Estimated Creatinine Clearance: 92.6 mL/min (by C-G formula based on SCr of 0.93 mg/dL). Liver Function Tests: Recent Labs  Lab 01/12/18 2111  AST 14*  ALT 13  ALKPHOS 78  BILITOT 0.6  PROT 6.5  ALBUMIN 3.6   No results for input(s): LIPASE, AMYLASE in the last 168 hours. No results for input(s): AMMONIA in the  last 168 hours. Coagulation Profile: Recent Labs  Lab 01/12/18 2111  INR 1.01   Cardiac Enzymes: No results for input(s): CKTOTAL, CKMB, CKMBINDEX, TROPONINI in the last 168 hours. BNP (last 3 results) No results for input(s): PROBNP in the last 8760 hours. HbA1C: No results for input(s): HGBA1C in the last 72 hours. CBG: Recent Labs  Lab 01/16/18 1142 01/16/18 1630 01/16/18 2203 01/17/18 0725 01/17/18 1248  GLUCAP 124* 100* 107* 101* 136*   Lipid Profile: No results for input(s): CHOL, HDL, LDLCALC, TRIG, CHOLHDL, LDLDIRECT in the last 72 hours. Thyroid Function Tests: No results for input(s): TSH, T4TOTAL, FREET4, T3FREE, THYROIDAB in the last 72 hours. Anemia Panel: No results for input(s): VITAMINB12, FOLATE, FERRITIN, TIBC, IRON, RETICCTPCT in the last 72 hours. Sepsis Labs: No results for input(s): PROCALCITON, LATICACIDVEN in the last 168 hours.  No results found for this or any previous visit (from the past 240 hour(s)).       Radiology Studies: No results found.      Scheduled Meds: . allopurinol  300 mg Oral Daily  . aspirin  300 mg Rectal Daily   Or  .  aspirin  325 mg Oral Daily  . atorvastatin  80 mg Oral q1800  . clopidogrel  75 mg Oral Daily  . DULoxetine  60 mg Oral Daily  . enoxaparin (LOVENOX) injection  40 mg Subcutaneous Q24H  . feeding supplement (ENSURE ENLIVE)  237 mL Oral BID BM  . folic acid  1 mg Oral Daily  . insulin aspart  0-15 Units Subcutaneous TID WC  . insulin aspart  0-5 Units Subcutaneous QHS  . levETIRAcetam  500 mg Oral BID  . memantine  10 mg Oral BID  . mometasone-formoterol  2 puff Inhalation BID  . multivitamin with minerals  1 tablet Oral Daily  . nicotine  21 mg Transdermal Daily  . pantoprazole  40 mg Oral Daily  . pregabalin  150 mg Oral BID  . rOPINIRole  0.5 mg Oral QHS  . thiamine  100 mg Oral Daily   Or  . thiamine  100 mg Intravenous Daily   Continuous Infusions:   LOS: 4 days    Time spent: 35  mins.More than 50% of that time was spent in counseling and/or coordination of care.      Shelly Coss, MD [facility] Pager [phone]  If 7PM-7AM, please contact night-coverage [email or web address] Password [email or web address] 01/17/2018, 2:32 PM

## 2018-01-17 NOTE — Progress Notes (Signed)
Physical Therapy Treatment Patient Details Name: [name] MRN: [id number] DOB: [birth year] Today's Date: 01/17/2018    History of Present Illness Pt is an 67 y.o. male past medical history of hyperlipidemia, diabetes mellitus, coronary artery disease, alcohol abuse, congestive heart failure, COPD, CVA with baseline left-sided weakness, chronically occluded right ICA  presents to the emergency department on 12/4 for 2 to 3-day history of worsening left-sided weakness, left facial numbness and a recent fall. He underwent MRI brain in the emergency department showed acute multiple infarcts in the right MCA territory in addition to old chronic right MCA infarcts.   PT Comments    Pt demonstrates progression towards functional goals. Pt is currently at the overall level of min A for bed mobility, min-mod A with transfers, and min A with ambulation. Pt able to progress ambulation, walking ~130' with one rest break halfway through. Pt continues to demonstrate decreased foot clearance of the LLE, and relies on a SPC on the R side for support. Pt states mild dizziness when transitioning from supine to sitting that resolves shortly. Pt demonstrates decreased attention to the L side, especially the LUE. Current recommendation of CIR remains appropriate. Pt would benefit from continued acute PT in order to increased balance and functional mobility.      Follow Up Recommendations  CIR     Equipment Recommendations  None recommended by PT    Recommendations for Other Services       Precautions / Restrictions Precautions Precautions: Fall Restrictions Weight Bearing Restrictions: No    Mobility  Bed Mobility Overal bed mobility: Needs Assistance Bed Mobility: Supine to Sit     Supine to sit: Min assist     General bed mobility comments: Min A for trunk elevation. Pt able to assist with use of bed rail with RUE.   Transfers Overall transfer level: Needs assistance Equipment used:  None Transfers: Sit to/from Stand Sit to Stand: Min assist;Mod assist;+2 physical assistance         General transfer comment: Pt completes sit to stand 3 times. Pt initially requires min A for the first two sit to stands for power initiation, with mod A x2 on the last for power initiation and stability. Consistent guarding on the LLE, with not instances of buckling during transitional movements.   Ambulation/Gait Ambulation/Gait assistance: Min assist;+2 safety/equipment Gait Distance (Feet): 130 Feet(65x2) Assistive device: Straight cane Gait Pattern/deviations: Decreased step length - left;Decreased dorsiflexion - left;Decreased weight shift to left;Wide base of support   Gait velocity interpretation: <1.8 ft/sec, indicate of risk for recurrent falls General Gait Details: Pt initially able to ambulate around EOB to sitting in the chair. Pt was then able to ambulate ~130' with a rest break in a chair halfway through. Pt demonstrates decreased foot clearance with the LLE,  and relies on the cane in his R hand for balance. Min A given for stability throughout, with one instance of a misstep, with no loss of balance noted. Two person needed for chair follow and safety    Stairs             Wheelchair Mobility    Modified Rankin (Stroke Patients Only) Modified Rankin (Stroke Patients Only) Pre-Morbid Rankin Score: Moderate disability Modified Rankin: Moderately severe disability     Balance Overall balance assessment: Needs assistance Sitting-balance support: No upper extremity supported;Feet supported Sitting balance-Leahy Scale: Fair     Standing balance support: During functional activity;Single extremity supported Standing balance-Leahy Scale: Poor Standing balance comment: Pt  requires support of RUE and min A during standing for stability.                              Cognition Arousal/Alertness: Awake/alert Behavior During Therapy: WFL for tasks  assessed/performed Overall Cognitive Status: Impaired/Different from baseline Area of Impairment: Following commands;Safety/judgement;Awareness;Problem solving                       Following Commands: Follows one step commands consistently;Follows one step commands with increased time Safety/Judgement: Decreased awareness of safety;Decreased awareness of deficits Awareness: Emergent Problem Solving: Slow processing;Requires verbal cues;Decreased initiation        Exercises      General Comments        Pertinent Vitals/Pain Pain Assessment: Faces Faces Pain Scale: Hurts little more Pain Location: L hip Pain Descriptors / Indicators: Aching Pain Intervention(s): Monitored during session    Home Living                      Prior Function            PT Goals (current goals can now be found in the care plan section) Acute Rehab PT Goals Patient Stated Goal: to return home PT Goal Formulation: With patient Time For Goal Achievement: 01/27/18 Potential to Achieve Goals: Good Progress towards PT goals: Progressing toward goals    Frequency    Min 4X/week      PT Plan Current plan remains appropriate    Co-evaluation              AM-PAC PT "6 Clicks" Mobility   Outcome Measure  Help needed turning from your back to your side while in a flat bed without using bedrails?: A Little Help needed moving from lying on your back to sitting on the side of a flat bed without using bedrails?: A Little Help needed moving to and from a bed to a chair (including a wheelchair)?: A Little Help needed standing up from a chair using your arms (e.g., wheelchair or bedside chair)?: A Little Help needed to walk in hospital room?: A Lot Help needed climbing 3-5 steps with a railing? : A Lot 6 Click Score: 16    End of Session Equipment Utilized During Treatment: Gait belt Activity Tolerance: Patient tolerated treatment well Patient left: in chair;with call  bell/phone within reach;with chair alarm set Nurse Communication: Mobility status PT Visit Diagnosis: Unsteadiness on feet (R26.81);Other abnormalities of gait and mobility (R26.89);Difficulty in walking, not elsewhere classified (R26.2) Hemiplegia - Right/Left: Left Hemiplegia - dominant/non-dominant: Non-dominant Hemiplegia - caused by: Cerebral infarction     Time: 0175-1025 PT Time Calculation (min) (ACUTE ONLY): 28 min  Charges:  $Gait Training: 23-37 mins                     Wandra Feinstein, SPT Acute Rehab [phone] (pager) [phone] (office)    Amyra Vantuyl 01/17/2018, 2:00 PM

## 2018-01-17 NOTE — Care Management Important Message (Signed)
Important Message  Patient Details  Name: [name] MRN: [id number] Date of Birth: 1950/11/28   Medicare Important Message Given:  Yes    [name] 01/17/2018, 4:22 PM

## 2018-01-18 ENCOUNTER — Other Ambulatory Visit: Payer: Self-pay

## 2018-01-18 ENCOUNTER — Encounter (HOSPITAL_COMMUNITY): Payer: Self-pay | Admitting: *Deleted

## 2018-01-18 ENCOUNTER — Inpatient Hospital Stay (HOSPITAL_COMMUNITY)
Admission: RE | Admit: 2018-01-18 | Discharge: 2018-02-04 | DRG: 057 | Disposition: A | Discharge status: Skilled Nursing Facility | Payer: Medicare Other | Source: Intra-hospital | Attending: Physical Medicine & Rehabilitation | Admitting: Physical Medicine & Rehabilitation

## 2018-01-18 DIAGNOSIS — G2581 Restless legs syndrome: Secondary | ICD-10-CM | POA: Diagnosis not present

## 2018-01-18 DIAGNOSIS — G9340 Encephalopathy, unspecified: Secondary | ICD-10-CM | POA: Diagnosis not present

## 2018-01-18 DIAGNOSIS — I951 Orthostatic hypotension: Secondary | ICD-10-CM | POA: Diagnosis present

## 2018-01-18 DIAGNOSIS — F329 Major depressive disorder, single episode, unspecified: Secondary | ICD-10-CM | POA: Diagnosis present

## 2018-01-18 DIAGNOSIS — E1142 Type 2 diabetes mellitus with diabetic polyneuropathy: Secondary | ICD-10-CM

## 2018-01-18 DIAGNOSIS — Z86718 Personal history of other venous thrombosis and embolism: Secondary | ICD-10-CM | POA: Diagnosis not present

## 2018-01-18 DIAGNOSIS — Z7951 Long term (current) use of inhaled steroids: Secondary | ICD-10-CM | POA: Diagnosis not present

## 2018-01-18 DIAGNOSIS — Z8249 Family history of ischemic heart disease and other diseases of the circulatory system: Secondary | ICD-10-CM

## 2018-01-18 DIAGNOSIS — J449 Chronic obstructive pulmonary disease, unspecified: Secondary | ICD-10-CM | POA: Diagnosis present

## 2018-01-18 DIAGNOSIS — M109 Gout, unspecified: Secondary | ICD-10-CM | POA: Diagnosis present

## 2018-01-18 DIAGNOSIS — E8809 Other disorders of plasma-protein metabolism, not elsewhere classified: Secondary | ICD-10-CM | POA: Diagnosis present

## 2018-01-18 DIAGNOSIS — M62838 Other muscle spasm: Secondary | ICD-10-CM | POA: Diagnosis not present

## 2018-01-18 DIAGNOSIS — G4700 Insomnia, unspecified: Secondary | ICD-10-CM | POA: Diagnosis present

## 2018-01-18 DIAGNOSIS — M2550 Pain in unspecified joint: Secondary | ICD-10-CM | POA: Diagnosis not present

## 2018-01-18 DIAGNOSIS — E1151 Type 2 diabetes mellitus with diabetic peripheral angiopathy without gangrene: Secondary | ICD-10-CM | POA: Diagnosis not present

## 2018-01-18 DIAGNOSIS — L299 Pruritus, unspecified: Secondary | ICD-10-CM | POA: Diagnosis not present

## 2018-01-18 DIAGNOSIS — G40909 Epilepsy, unspecified, not intractable, without status epilepticus: Secondary | ICD-10-CM | POA: Diagnosis present

## 2018-01-18 DIAGNOSIS — I63411 Cerebral infarction due to embolism of right middle cerebral artery: Secondary | ICD-10-CM

## 2018-01-18 DIAGNOSIS — D62 Acute posthemorrhagic anemia: Secondary | ICD-10-CM

## 2018-01-18 DIAGNOSIS — I480 Paroxysmal atrial fibrillation: Secondary | ICD-10-CM | POA: Diagnosis not present

## 2018-01-18 DIAGNOSIS — F17290 Nicotine dependence, other tobacco product, uncomplicated: Secondary | ICD-10-CM | POA: Diagnosis present

## 2018-01-18 DIAGNOSIS — Z7401 Bed confinement status: Secondary | ICD-10-CM | POA: Diagnosis not present

## 2018-01-18 DIAGNOSIS — F0280 Dementia in other diseases classified elsewhere without behavioral disturbance: Secondary | ICD-10-CM

## 2018-01-18 DIAGNOSIS — E119 Type 2 diabetes mellitus without complications: Secondary | ICD-10-CM

## 2018-01-18 DIAGNOSIS — I6521 Occlusion and stenosis of right carotid artery: Secondary | ICD-10-CM | POA: Diagnosis present

## 2018-01-18 DIAGNOSIS — I10 Essential (primary) hypertension: Secondary | ICD-10-CM | POA: Diagnosis not present

## 2018-01-18 DIAGNOSIS — R0989 Other specified symptoms and signs involving the circulatory and respiratory systems: Secondary | ICD-10-CM | POA: Diagnosis not present

## 2018-01-18 DIAGNOSIS — E1169 Type 2 diabetes mellitus with other specified complication: Secondary | ICD-10-CM

## 2018-01-18 DIAGNOSIS — I252 Old myocardial infarction: Secondary | ICD-10-CM | POA: Diagnosis not present

## 2018-01-18 DIAGNOSIS — I110 Hypertensive heart disease with heart failure: Secondary | ICD-10-CM | POA: Diagnosis not present

## 2018-01-18 DIAGNOSIS — I69354 Hemiplegia and hemiparesis following cerebral infarction affecting left non-dominant side: Principal | ICD-10-CM

## 2018-01-18 DIAGNOSIS — J039 Acute tonsillitis, unspecified: Secondary | ICD-10-CM | POA: Diagnosis present

## 2018-01-18 DIAGNOSIS — E669 Obesity, unspecified: Secondary | ICD-10-CM

## 2018-01-18 DIAGNOSIS — I5032 Chronic diastolic (congestive) heart failure: Secondary | ICD-10-CM | POA: Diagnosis not present

## 2018-01-18 DIAGNOSIS — Z7982 Long term (current) use of aspirin: Secondary | ICD-10-CM | POA: Diagnosis not present

## 2018-01-18 DIAGNOSIS — I2510 Atherosclerotic heart disease of native coronary artery without angina pectoris: Secondary | ICD-10-CM | POA: Diagnosis present

## 2018-01-18 DIAGNOSIS — Z7902 Long term (current) use of antithrombotics/antiplatelets: Secondary | ICD-10-CM | POA: Diagnosis not present

## 2018-01-18 DIAGNOSIS — E46 Unspecified protein-calorie malnutrition: Secondary | ICD-10-CM | POA: Diagnosis not present

## 2018-01-18 DIAGNOSIS — E785 Hyperlipidemia, unspecified: Secondary | ICD-10-CM | POA: Diagnosis present

## 2018-01-18 DIAGNOSIS — F1410 Cocaine abuse, uncomplicated: Secondary | ICD-10-CM | POA: Diagnosis present

## 2018-01-18 DIAGNOSIS — K5900 Constipation, unspecified: Secondary | ICD-10-CM | POA: Diagnosis not present

## 2018-01-18 DIAGNOSIS — F419 Anxiety disorder, unspecified: Secondary | ICD-10-CM | POA: Diagnosis present

## 2018-01-18 DIAGNOSIS — M1000 Idiopathic gout, unspecified site: Secondary | ICD-10-CM | POA: Diagnosis not present

## 2018-01-18 DIAGNOSIS — K219 Gastro-esophageal reflux disease without esophagitis: Secondary | ICD-10-CM | POA: Diagnosis not present

## 2018-01-18 DIAGNOSIS — I63511 Cerebral infarction due to unspecified occlusion or stenosis of right middle cerebral artery: Secondary | ICD-10-CM | POA: Diagnosis not present

## 2018-01-18 DIAGNOSIS — M6281 Muscle weakness (generalized): Secondary | ICD-10-CM | POA: Diagnosis not present

## 2018-01-18 DIAGNOSIS — R911 Solitary pulmonary nodule: Secondary | ICD-10-CM | POA: Diagnosis present

## 2018-01-18 DIAGNOSIS — R471 Dysarthria and anarthria: Secondary | ICD-10-CM | POA: Diagnosis present

## 2018-01-18 DIAGNOSIS — F1010 Alcohol abuse, uncomplicated: Secondary | ICD-10-CM | POA: Diagnosis present

## 2018-01-18 DIAGNOSIS — I639 Cerebral infarction, unspecified: Secondary | ICD-10-CM | POA: Diagnosis not present

## 2018-01-18 DIAGNOSIS — E118 Type 2 diabetes mellitus with unspecified complications: Secondary | ICD-10-CM | POA: Diagnosis not present

## 2018-01-18 DIAGNOSIS — I5033 Acute on chronic diastolic (congestive) heart failure: Secondary | ICD-10-CM | POA: Diagnosis not present

## 2018-01-18 DIAGNOSIS — Z888 Allergy status to other drugs, medicaments and biological substances status: Secondary | ICD-10-CM

## 2018-01-18 DIAGNOSIS — F17210 Nicotine dependence, cigarettes, uncomplicated: Secondary | ICD-10-CM | POA: Diagnosis present

## 2018-01-18 DIAGNOSIS — R2681 Unsteadiness on feet: Secondary | ICD-10-CM | POA: Diagnosis not present

## 2018-01-18 DIAGNOSIS — R569 Unspecified convulsions: Secondary | ICD-10-CM

## 2018-01-18 DIAGNOSIS — Z9119 Patient's noncompliance with other medical treatment and regimen: Secondary | ICD-10-CM

## 2018-01-18 DIAGNOSIS — G8114 Spastic hemiplegia affecting left nondominant side: Secondary | ICD-10-CM | POA: Diagnosis not present

## 2018-01-18 HISTORY — DX: Cerebral infarction due to unspecified occlusion or stenosis of right middle cerebral artery: I63.511

## 2018-01-18 LAB — CBC
HCT: 38.1 % — ABNORMAL LOW (ref 39.0–52.0)
Hemoglobin: 11.7 g/dL — ABNORMAL LOW (ref 13.0–17.0)
MCH: 27.6 pg (ref 26.0–34.0)
MCHC: 30.7 g/dL (ref 30.0–36.0)
MCV: 89.9 fL (ref 80.0–100.0)
Platelets: 157 10*3/uL (ref 150–400)
RBC: 4.24 MIL/uL (ref 4.22–5.81)
RDW: 13.9 % (ref 11.5–15.5)
WBC: 5.6 10*3/uL (ref 4.0–10.5)
nRBC: 0 % (ref 0.0–0.2)

## 2018-01-18 LAB — GLUCOSE, CAPILLARY
Glucose-Capillary: 96 mg/dL (ref 70–99)
Glucose-Capillary: 184 mg/dL — ABNORMAL HIGH (ref 70–99)
Glucose-Capillary: 94 mg/dL (ref 70–99)
Glucose-Capillary: 111 mg/dL — ABNORMAL HIGH (ref 70–99)

## 2018-01-18 LAB — CREATININE, SERUM
Creatinine, Ser: 0.78 mg/dL (ref 0.61–1.24)
GFR calc non Af Amer: >60 mL/min (ref >60)

## 2018-01-18 MED ORDER — CLOPIDOGREL BISULFATE 75 MG PO TABS
75.0000 mg | ORAL_TABLET | Freq: Every day | ORAL | Status: DC
  Administered 2018-01-19 – 2018-02-04 (×17): 75 mg via ORAL
  Filled 2018-01-18 (×17): qty 1

## 2018-01-18 MED ORDER — PREGABALIN 75 MG PO CAPS
150.0000 mg | ORAL_CAPSULE | Freq: Two times a day (BID) | ORAL | Status: DC
  Administered 2018-01-18 – 2018-02-04 (×34): 150 mg via ORAL
  Filled 2018-01-18 (×34): qty 2

## 2018-01-18 MED ORDER — ENSURE ENLIVE PO LIQD
237.0000 mL | Freq: Two times a day (BID) | ORAL | Status: DC
  Administered 2018-01-19 – 2018-02-04 (×28): 237 mL via ORAL
  Filled 2018-01-18 (×2): qty 237

## 2018-01-18 MED ORDER — MEMANTINE HCL 10 MG PO TABS
10.0000 mg | ORAL_TABLET | Freq: Two times a day (BID) | ORAL | Status: DC
  Administered 2018-01-18 – 2018-02-04 (×34): 10 mg via ORAL
  Filled 2018-01-18 (×35): qty 1

## 2018-01-18 MED ORDER — ROPINIROLE HCL 0.5 MG PO TABS
0.5000 mg | ORAL_TABLET | Freq: Every day | ORAL | Status: DC
  Administered 2018-01-18 – 2018-01-25 (×8): 0.5 mg via ORAL
  Filled 2018-01-18 (×8): qty 1

## 2018-01-18 MED ORDER — THIAMINE HCL 100 MG/ML IJ SOLN
100.0000 mg | Freq: Every day | INTRAMUSCULAR | Status: DC
  Filled 2018-01-18 (×17): qty 1

## 2018-01-18 MED ORDER — FOLIC ACID 1 MG PO TABS
1.0000 mg | ORAL_TABLET | Freq: Every day | ORAL | Status: DC
  Administered 2018-01-19 – 2018-02-04 (×17): 1 mg via ORAL
  Filled 2018-01-18 (×17): qty 1

## 2018-01-18 MED ORDER — THIAMINE HCL 100 MG PO TABS: 100.0000 mg | ORAL_TABLET | Freq: Every day | ORAL | Status: DC

## 2018-01-18 MED ORDER — NICOTINE 21 MG/24HR TD PT24
21.0000 mg | MEDICATED_PATCH | Freq: Every day | TRANSDERMAL | Status: DC
  Administered 2018-01-19 – 2018-02-04 (×17): 21 mg via TRANSDERMAL
  Filled 2018-01-18 (×17): qty 1

## 2018-01-18 MED ORDER — ALBUTEROL SULFATE (2.5 MG/3ML) 0.083% IN NEBU: 2.5000 mg | INHALATION_SOLUTION | Freq: Four times a day (QID) | RESPIRATORY_TRACT | Status: DC | PRN

## 2018-01-18 MED ORDER — ENOXAPARIN SODIUM 40 MG/0.4ML ~~LOC~~ SOLN: 40.0000 mg | SUBCUTANEOUS | Status: DC

## 2018-01-18 MED ORDER — CARVEDILOL 12.5 MG PO TABS
12.5000 mg | ORAL_TABLET | Freq: Two times a day (BID) | ORAL | Status: DC
Start: 2018-01-18 — End: 2018-01-28
  Administered 2018-01-18 – 2018-01-28 (×16): 12.5 mg via ORAL
  Filled 2018-01-18 (×20): qty 1

## 2018-01-18 MED ORDER — LACTULOSE 10 GM/15ML PO SOLN
10.0000 g | Freq: Two times a day (BID) | ORAL | Status: DC | PRN
  Administered 2018-01-30 – 2018-02-02 (×2): 10 g via ORAL
  Filled 2018-01-18 (×2): qty 15

## 2018-01-18 MED ORDER — ATORVASTATIN CALCIUM 80 MG PO TABS
80.0000 mg | ORAL_TABLET | Freq: Every day | ORAL | Status: DC
  Administered 2018-01-18 – 2018-02-03 (×17): 80 mg via ORAL
  Filled 2018-01-18 (×17): qty 1

## 2018-01-18 MED ORDER — FUROSEMIDE 40 MG PO TABS: 40.0000 mg | ORAL_TABLET | Freq: Two times a day (BID) | ORAL | Status: DC

## 2018-01-18 MED ORDER — SENNOSIDES-DOCUSATE SODIUM 8.6-50 MG PO TABS: 1.0000 | ORAL_TABLET | Freq: Every evening | ORAL | Status: DC | PRN

## 2018-01-18 MED ORDER — FOLIC ACID 1 MG PO TABS: 1.0000 mg | ORAL_TABLET | Freq: Every day | ORAL | Status: DC

## 2018-01-18 MED ORDER — ASPIRIN 300 MG RE SUPP
300.0000 mg | Freq: Every day | RECTAL | Status: DC
  Filled 2018-01-18 (×2): qty 1

## 2018-01-18 MED ORDER — ACETAMINOPHEN 650 MG RE SUPP: 650.0000 mg | RECTAL | Status: DC | PRN

## 2018-01-18 MED ORDER — LEVETIRACETAM 500 MG PO TABS
500.0000 mg | ORAL_TABLET | Freq: Two times a day (BID) | ORAL | Status: DC
  Administered 2018-01-18 – 2018-02-04 (×34): 500 mg via ORAL
  Filled 2018-01-18 (×34): qty 1

## 2018-01-18 MED ORDER — INSULIN ASPART 100 UNIT/ML ~~LOC~~ SOLN: 0.0000 [IU] | Freq: Three times a day (TID) | SUBCUTANEOUS | Status: DC

## 2018-01-18 MED ORDER — CARVEDILOL 12.5 MG PO TABS: 12.5000 mg | ORAL_TABLET | Freq: Two times a day (BID) | ORAL | Status: DC

## 2018-01-18 MED ORDER — NAPHAZOLINE-PHENIRAMINE 0.025-0.3 % OP SOLN
1.0000 [drp] | Freq: Four times a day (QID) | OPHTHALMIC | Status: DC | PRN
  Filled 2018-01-18: qty 15

## 2018-01-18 MED ORDER — FUROSEMIDE 40 MG PO TABS
40.0000 mg | ORAL_TABLET | Freq: Two times a day (BID) | ORAL | Status: DC
  Administered 2018-01-18 – 2018-01-26 (×16): 40 mg via ORAL
  Filled 2018-01-18 (×16): qty 1

## 2018-01-18 MED ORDER — ASPIRIN 325 MG PO TABS
325.0000 mg | ORAL_TABLET | Freq: Every day | ORAL | Status: DC
  Administered 2018-01-19 – 2018-02-04 (×17): 325 mg via ORAL
  Filled 2018-01-18 (×17): qty 1

## 2018-01-18 MED ORDER — VITAMIN B-1 100 MG PO TABS
100.0000 mg | ORAL_TABLET | Freq: Every day | ORAL | Status: DC
  Administered 2018-01-19 – 2018-02-04 (×17): 100 mg via ORAL
  Filled 2018-01-18 (×17): qty 1

## 2018-01-18 MED ORDER — ENOXAPARIN SODIUM 40 MG/0.4ML ~~LOC~~ SOLN
40.0000 mg | SUBCUTANEOUS | Status: DC
  Administered 2018-01-19 – 2018-02-04 (×17): 40 mg via SUBCUTANEOUS
  Filled 2018-01-18 (×17): qty 0.4

## 2018-01-18 MED ORDER — HYDROXYZINE HCL 25 MG PO TABS: 25.0000 mg | ORAL_TABLET | Freq: Four times a day (QID) | ORAL | Status: DC | PRN

## 2018-01-18 MED ORDER — PANTOPRAZOLE SODIUM 40 MG PO TBEC
40.0000 mg | DELAYED_RELEASE_TABLET | Freq: Every day | ORAL | Status: DC
  Administered 2018-01-19 – 2018-02-04 (×17): 40 mg via ORAL
  Filled 2018-01-18 (×17): qty 1

## 2018-01-18 MED ORDER — ADULT MULTIVITAMIN W/MINERALS CH
1.0000 | ORAL_TABLET | Freq: Every day | ORAL | Status: DC
  Administered 2018-01-19 – 2018-02-04 (×17): 1 via ORAL
  Filled 2018-01-18 (×17): qty 1

## 2018-01-18 MED ORDER — ACETAMINOPHEN 160 MG/5ML PO SOLN: 650.0000 mg | ORAL | Status: DC | PRN

## 2018-01-18 MED ORDER — ALLOPURINOL 100 MG PO TABS
300.0000 mg | ORAL_TABLET | Freq: Every day | ORAL | Status: DC
  Administered 2018-01-19 – 2018-02-02 (×15): 300 mg via ORAL
  Administered 2018-02-03: 200 mg via ORAL
  Administered 2018-02-04: 300 mg via ORAL
  Filled 2018-01-18 (×17): qty 3

## 2018-01-18 MED ORDER — ACETAMINOPHEN 325 MG PO TABS
650.0000 mg | ORAL_TABLET | ORAL | Status: DC | PRN
  Administered 2018-01-18 – 2018-02-01 (×6): 650 mg via ORAL
  Filled 2018-01-18 (×7): qty 2

## 2018-01-18 MED ORDER — DULOXETINE HCL 60 MG PO CPEP
60.0000 mg | ORAL_CAPSULE | Freq: Every day | ORAL | Status: DC
  Administered 2018-01-19 – 2018-02-04 (×17): 60 mg via ORAL
  Filled 2018-01-18 (×17): qty 1

## 2018-01-18 MED ORDER — MOMETASONE FURO-FORMOTEROL FUM 200-5 MCG/ACT IN AERO
2.0000 | INHALATION_SPRAY | Freq: Two times a day (BID) | RESPIRATORY_TRACT | Status: DC
  Administered 2018-01-18 – 2018-02-04 (×33): 2 via RESPIRATORY_TRACT
  Filled 2018-01-18: qty 8.8

## 2018-01-18 NOTE — H&P (Signed)
Physical Medicine and Rehabilitation Admission H&P    Chief Complaint  Patient presents with  . Weakness  . Fall  : HPI: [name] is a 67 year old right-handed male with history of diabetes mellitus,seizure disorder maintained on Keppra CAD, polysubstance and alcohol abuse, diastolic congestive heart failure, COPD, chronically occluded right ICA, recent CVA with left-sided residual weakness maintained on aspirin and Plavix and was discharged to [facility] facility for 3 weeks before returning home. Per chart review patient lives at [facility] senior apartments with assistance from a home health aide. He was using a walker while at home with noted falls.Presented 01/13/2018 with increasing weakness 2-3 days.UDS positive cocaine. MRI showed multifocal acute small right frontal MCA territory infarctions. Old right MCA territory infarcts. Cerebral angiogram again confirmed occluded right ICA stent without evidence of string sign and no plan for further intervention. Echocardiogram with ejection fraction of 55-60% without emboli.Chest x-ray with incidental finding of right upper lobe nodule plan follow-up outpatient. Neurology follow-up currently maintained on aspirin and Plavix for CVA prophylaxis. Subcutaneous Lovenox for DVT prophylaxis. Tolerating a regular consistency diet. Therapy evaluations completed with recommendations of physical medicine rehabilitation consult. Patient was admitted for a compressive rehabilitation program.  Review of Systems  Constitutional: Negative for chills and fever.  HENT: Negative for hearing loss.   Eyes: Negative for blurred vision and double vision.  Respiratory: Positive for shortness of breath.   Cardiovascular: Positive for leg swelling. Negative for chest pain.  Gastrointestinal: Positive for constipation. Negative for nausea and vomiting.       GERD  Genitourinary: Positive for urgency. Negative for dysuria, flank pain  and hematuria.  Musculoskeletal: Positive for falls.  Skin: Negative for rash.  Neurological: Positive for seizures.  Psychiatric/Behavioral: Positive for depression.       Anxiety  All other systems reviewed and are negative.  Past Medical History:  Diagnosis Date  . Alcohol abuse    H/O  . Anxiety   . Arthritis   . Asthma   . CAD (coronary artery disease) 05/27/10   Cath: severe single vessell CAD left cx midportion obtuse marginal 2 to 3.  [name] CHF (congestive heart failure) ([place])   . COPD (chronic obstructive pulmonary disease) ([place])   . COPD (chronic obstructive pulmonary disease) ([place])   . Depression   . Diabetes mellitus, type 2 ([place]) 03/13/2015  . GERD (gastroesophageal reflux disease)   . HCAP (healthcare-associated pneumonia) 10/15/2015  . History of DVT of lower extremity   . Hyperlipemia   . Hyperlipidemia   . Hypertension   . Myocardial infarction ([place]) 2000  . Orbital fracture 12/2012  . OSA on CPAP   . Peripheral vascular disease, unspecified ([place])    08/20/10 doppler: increase in right ABI post-op. Left ABI stable. S/P bi-fem bypass surgery  . Pneumonia 04/04/2012  . Shortness of breath   . Stroke ([place])   . Tobacco abuse    Past Surgical History:  Procedure Laterality Date  . abdoninal ao angio & bifem angio  05/27/10   Patent graft, occluded bil stents with no retrograde flow into the hypogastric arteries. 100% occl left ant. tibial artery, 70% to 80% to 100% stenosis right superficial fem artery above adductor canal. 100 % occl right ant. tibial vessell  . Aortogram w/ PTCA  09/292003  . BACK SURGERY    . CARDIAC CATHETERIZATION  05/27/10   severe CAD left cx  . CHOLECYSTECTOMY    . ESOPHAGOGASTRIC FUNDOPLICATION    .  EYE SURGERY    . FEMORAL BYPASS  08/19/10   Right Fem-Pop  . IR ANGIO INTRA EXTRACRAN SEL COM CAROTID INNOMINATE BILAT MOD SED  07/20/2016  . IR ANGIO INTRA EXTRACRAN SEL COM CAROTID INNOMINATE BILAT MOD SED  01/14/2018  . IR ANGIO VERTEBRAL  SEL SUBCLAVIAN INNOMINATE UNI R MOD SED  07/20/2016  . IR ANGIO VERTEBRAL SEL VERTEBRAL BILAT MOD SED  01/14/2018  . IR ANGIO VERTEBRAL SEL VERTEBRAL UNI L MOD SED  07/20/2016  . IR RADIOLOGIST EVAL & MGMT  05/27/2016  . IR US GUIDE VASC ACCESS RIGHT  01/14/2018  . JOINT REPLACEMENT    . PERIPHERAL VASCULAR CATHETERIZATION N/A 12/10/2014   Procedure: Abdominal Aortogram;  Surgeon: Angelia Mould, MD;  Location: [facility];  Service: Cardiovascular;  Laterality: N/A;  . RADIOLOGY WITH ANESTHESIA N/A 02/08/2015   Procedure: RADIOLOGY WITH ANESTHESIA;  Surgeon: Luanne Bras, MD;  Location: [facility];  Service: Radiology;  Laterality: N/A;  . TEE WITHOUT CARDIOVERSION N/A 08/29/2014   Procedure: TRANSESOPHAGEAL ECHOCARDIOGRAM (TEE);  Surgeon: Josue Hector, MD;  Location: [facility];  Service: Cardiovascular;  Laterality: N/A;  . TOTAL KNEE ARTHROPLASTY     Family History  Problem Relation Age of Onset  . Heart attack Mother   . Heart failure Mother   . Cirrhosis Father   . Heart failure Brother   . Cancer Brother   . Hypertension Neg Hx        UNKNOWN  . Stroke Neg Hx        UNKNOWN   Social History:  reports that he has been smoking cigars and cigarettes. He started smoking about 56 years ago. He has a 25.00 pack-year smoking history. He has quit using smokeless tobacco.  His smokeless tobacco use included chew. He reports that he drinks alcohol. He reports that he has current or past drug history. Drug: Cocaine. Allergies:  Allergies  Allergen Reactions  . Darvocet [Propoxyphene N-Acetaminophen] Hives  . Haldol [Haloperidol Decanoate] Hives  . Acetaminophen Nausea Only    Upset stomach, tolerates Hydrocodone/APAP if taken with food  . Metformin And Related Nausea And Vomiting  . Norco [Hydrocodone-Acetaminophen] Nausea And Vomiting    Tolerates if taken with food   Medications Prior to Admission  Medication Sig Dispense Refill  . ACCU-CHEK SOFTCLIX LANCETS lancets  Use 3 times daily before meals 100 each 5  . albuterol (PROAIR HFA) 108 (90 Base) MCG/ACT inhaler INHALE 2 PUFFS EVERY 6 HOURS AS NEEDED FOR WHEEZING OR SHORTNESS OF BREATH (Patient taking differently: Inhale 2 puffs into the lungs every 6 (six) hours as needed for wheezing or shortness of breath. ) 8.5 g 2  . allopurinol (ZYLOPRIM) 300 MG tablet Take 1 tablet (300 mg total) by mouth daily. 30 tablet 3  . aspirin 325 MG tablet Take 1 tablet (325 mg total) by mouth daily. (Patient taking differently: Take 81 mg by mouth daily. )    . atorvastatin (LIPITOR) 80 MG tablet Take 1 tablet (80 mg total) by mouth daily at 6 PM. 30 tablet 3  . Blood Glucose Monitoring Suppl (ACCU-CHEK AVIVA) device Use 3 times daily before meals. 1 each 0  . carvedilol (COREG) 12.5 MG tablet Take 1 tablet (12.5 mg total) by mouth 2 (two) times daily with a meal. 60 tablet 3  . clopidogrel (PLAVIX) 75 MG tablet Take 1 tablet (75 mg total) by mouth daily. 30 tablet 3  . DULoxetine (CYMBALTA) 60 MG capsule Take 1 capsule (60  mg total) by mouth daily. 30 capsule 3  . Fluticasone-Salmeterol (ADVAIR DISKUS) 250-50 MCG/DOSE AEPB Inhale 1 puff into the lungs 2 (two) times daily. 1 each 3  . furosemide (LASIX) 20 MG tablet Take 2 tablets (40 mg total) by mouth 2 (two) times daily. 360 tablet 0  . glucose blood (ACCU-CHEK AVIVA) test strip Use 3 times daily before meals as directed. 100 each 12  . hydrocortisone cream 1 % Apply 1 application topically 2 (two) times daily as needed for itching.     . hydrOXYzine (ATARAX/VISTARIL) 25 MG tablet Take 25 mg by mouth every 6 (six) hours as needed for anxiety or itching.     . lactulose (CHRONULAC) 10 GM/15ML solution Take 15 mLs (10 g total) by mouth 2 (two) times daily as needed for mild constipation. 236 mL 1  . Lancet Devices (ACCU-CHEK SOFTCLIX) lancets Use 3 times daily before meals 1 each 5  . levETIRAcetam (KEPPRA) 500 MG tablet Take 1 tablet (500 mg total) by mouth 2 (two) times  daily. 60 tablet 3  . memantine (NAMENDA) 10 MG tablet Take 1 tablet (10 mg total) by mouth 2 (two) times daily. 60 tablet 3  . naphazoline-pheniramine (NAPHCON-A) 0.025-0.3 % ophthalmic solution Place 1 drop into the left eye 4 (four) times daily as needed for eye irritation.  0  . pantoprazole (PROTONIX) 40 MG tablet Take 1 tablet (40 mg total) by mouth daily. 30 tablet 3  . pregabalin (LYRICA) 150 MG capsule Take 1 capsule (150 mg total) by mouth 2 (two) times daily. 180 capsule 0  . rOPINIRole (REQUIP) 0.5 MG tablet Take 1 tablet (0.5 mg total) by mouth at bedtime. 30 tablet 3  . tiZANidine (ZANAFLEX) 4 MG tablet Take 1 tablet (4 mg total) by mouth every 8 (eight) hours as needed for muscle spasms. 90 tablet 3  . traZODone (DESYREL) 150 MG tablet Take 1 tablet (150 mg total) by mouth at bedtime as needed for sleep. (Patient not taking: Reported on 11/28/2017) 30 tablet 3    Drug Regimen Review Drug regimen was reviewed and remains appropriate with no significant issues identified  Home: Home Living Family/patient expects to be discharged to:: Private residence Living Arrangements: Alone Available Help at Discharge: Personal care attendant Type of Home: Apartment Home Access: Elevator Home Layout: One level Bathroom Shower/Tub: Chiropodist: Handicapped height Home Equipment: Radio producer - single point, Civil engineer, contracting, Environmental consultant - 4 wheels, Grab bars - tub/shower, Bedside commode, Wheelchair - power Additional Comments: had a manual w/c but got lost in ALF in [place] and they lost the power cord for electric w/c  Lives With: Alone   Functional History: Prior Function Level of Independence: Needs assistance Gait / Transfers Assistance Needed: walks with cane  ADL's / Homemaking Assistance Needed: needs assist; aide helps with bath, clothing, medications, meals and takes to appointments Communication / Swallowing Assistance Needed: HOH  Functional Status:  Mobility: Bed  Mobility Overal bed mobility: Needs Assistance Bed Mobility: Supine to Sit Supine to sit: Min assist General bed mobility comments: Min A for trunk elevation. Pt able to assist with use of bed rail with RUE.  Transfers Overall transfer level: Needs assistance Equipment used: None Transfers: Sit to/from Stand Sit to Stand: Min assist, Mod assist, +2 physical assistance Stand pivot transfers: Min assist General transfer comment: Pt completes sit to stand 3 times. Pt initially requires min A for the first two sit to stands for power initiation, with mod A x2 on  the last for power initiation and stability. Consistent guarding on the LLE, with not instances of buckling during transitional movements.  Ambulation/Gait Ambulation/Gait assistance: Min assist, +2 safety/equipment Gait Distance (Feet): 130 Feet(65x2) Assistive device: Straight cane Gait Pattern/deviations: Decreased step length - left, Decreased dorsiflexion - left, Decreased weight shift to left, Wide base of support General Gait Details: Pt initially able to ambulate around EOB to sitting in the chair. Pt was then able to ambulate ~130' with a rest break in a chair halfway through. Pt demonstrates decreased foot clearance with the LLE,  and relies on the cane in his R hand for balance. Min A given for stability throughout, with one instance of a misstep, with no loss of balance noted. Two person needed for chair follow and safety  Gait velocity interpretation: <1.8 ft/sec, indicate of risk for recurrent falls    ADL: ADL Overall ADL's : Needs assistance/impaired Eating/Feeding: Minimal assistance, Sitting Eating/Feeding Details (indicate cue type and reason): Min A for bilateral tasks Grooming: Maximal assistance, Standing Grooming Details (indicate cue type and reason): Max A for standing balance. Min A for bialteral tasks while seated. Upper Body Bathing: Moderate assistance, Sitting Lower Body Bathing: Maximal assistance,  Moderate assistance, Sit to/from stand Lower Body Bathing Details (indicate cue type and reason): Mod-Max A for dynamic standing balance Upper Body Dressing : Moderate assistance, Sitting Lower Body Dressing: Moderate assistance, Maximal assistance, Sitting/lateral leans Lower Body Dressing Details (indicate cue type and reason): Pt donning socks while seated at EOB with Min A for initiating socks over toes. Toilet Transfer: Ambulation, RW, Maximal assistance Toilet Transfer Details (indicate cue type and reason): Mod-Max A for standing balance with pt with significant lean to left. Cues for bringing left foot forward. Max A for RW management. (simulated to recliner) Functional mobility during ADLs: Moderate assistance, Maximal assistance, Rolling walker(feel would perform better with cane) General ADL Comments: Pt motivated to participate. Presenting with poor functional use of LUE, balance, strength, and activity tolerance.   Cognition: Cognition Overall Cognitive Status: Impaired/Different from baseline Arousal/Alertness: Awake/alert Orientation Level: Oriented X4 Attention: Focused Focused Attention: Appears intact Alternating Attention: Impaired Alternating Attention Impairment: Verbal complex Memory Impairment: Retrieval deficit, Decreased recall of new information Awareness: Appears intact Problem Solving: Appears intact Executive Function: Writer: Impaired Organizing Impairment: Verbal complex Safety/Judgment: Appears intact Cognition Arousal/Alertness: Awake/alert Behavior During Therapy: WFL for tasks assessed/performed Overall Cognitive Status: Impaired/Different from baseline Area of Impairment: Following commands, Safety/judgement, Awareness, Problem solving Following Commands: Follows one step commands consistently, Follows one step commands with increased time Safety/Judgement: Decreased awareness of safety, Decreased awareness of deficits Awareness:  Emergent Problem Solving: Slow processing, Requires verbal cues, Decreased initiation General Comments: Pt requires multimodal cueing throughout. Pt is very motivated to participate in PT.   Physical Exam: Blood pressure (!) 168/76, pulse 75, temperature 98 F (36.7 C), resp. rate 17, height 6' (1.829 m), weight 98.9 kg, SpO2 95 %. Physical Exam  Neurological:  Patient is alert. Speech is dysarthric but intelligible. Follows commands. Reasonable insight and awareness.  He provides his name and age as well as date of birth. Left central 7. LUE 2 to 2+/5 prox to distal. LLE 2 to 2+/5 prox to distal. DTR's 1+. Senses light touch and PP in all 4.   Skin: Skin is warm.  A few scattered ecchymoses  Psychiatric: He has a normal mood and affect. His behavior is normal.    Results for orders placed or performed during the hospital encounter of 01/12/18 (  from the past 48 hour(s))  Glucose, capillary     Status: Abnormal   Collection Time: 01/16/18 11:42 AM  Result Value Ref Range   Glucose-Capillary 124 (H) 70 - 99 mg/dL  Glucose, capillary     Status: Abnormal   Collection Time: 01/16/18  4:30 PM  Result Value Ref Range   Glucose-Capillary 100 (H) 70 - 99 mg/dL  Glucose, capillary     Status: Abnormal   Collection Time: 01/16/18 10:03 PM  Result Value Ref Range   Glucose-Capillary 107 (H) 70 - 99 mg/dL  Glucose, capillary     Status: Abnormal   Collection Time: 01/17/18  7:25 AM  Result Value Ref Range   Glucose-Capillary 101 (H) 70 - 99 mg/dL   Comment 1 Notify RN    Comment 2 Document in Chart   Glucose, capillary     Status: Abnormal   Collection Time: 01/17/18 12:48 PM  Result Value Ref Range   Glucose-Capillary 136 (H) 70 - 99 mg/dL  Glucose, capillary     Status: Abnormal   Collection Time: 01/17/18  4:48 PM  Result Value Ref Range   Glucose-Capillary 116 (H) 70 - 99 mg/dL   Comment 1 Notify RN    Comment 2 Document in Chart   Glucose, capillary     Status: Abnormal    Collection Time: 01/17/18  9:25 PM  Result Value Ref Range   Glucose-Capillary 114 (H) 70 - 99 mg/dL   Comment 1 Notify RN    Comment 2 Document in Chart   Glucose, capillary     Status: None   Collection Time: 01/18/18  6:16 AM  Result Value Ref Range   Glucose-Capillary 96 70 - 99 mg/dL   Comment 1 Notify RN    Comment 2 Document in Chart    No results found.     Medical Problem List and Plan: 1.   Left hemiparesis secondary to  Right MCA small embolic infarcts due to chronic right ICA occlusion in the setting of cocaine abuse and suspect medical noncompliance  -admit to inpatient rehab 2.  DVT Prophylaxis/Anticoagulation: Subcutaneous Lovenox. Monitor for any bleeding episodes 3. Pain Management: Lyrica 150 mg twice a day 4. Mood:  Namenda 10 mg twice a day,Cymbalta 60 mg daily,  Atarax as needed for anxiety 5. Neuropsych: This patient is capable of making decisions on his own behalf.  6. Skin/Wound Care:  Routine skin checks 7. Fluids/Electrolytes/Nutrition:  Routine and out's with follow-up chemistries upon admit. 8. Seizure disorder. Keppra 500 mg twice a day 9. History of polysubstance abuse / abuse tobacco abuse. UDS positive for cocaine. Provide counseling. Continue NicoDerm patch 10. Diabetes mellitus with peripheral neuropathy.Hemoglobin A1c 5.4. SSI. Check blood sugars before meals and at bedtime 11. Diastolic congestive heart failure. Monitor for any signs of fluid overload  -check daily weights 12.  Hyperlipidemia. Lipitor 13. History of gout. Zyloprim 300 mg daily 14. Constipation. Laxative assistance  Post Admission Physician Evaluation: 1. Functional deficits secondary  to right MCA embolic infarct. 2. Patient is admitted to receive collaborative, interdisciplinary care between the physiatrist, rehab nursing staff, and therapy team. 3. Patient's level of medical complexity and substantial therapy needs in context of that medical necessity cannot be provided at a  lesser intensity of care such as a SNF. 4. Patient has experienced substantial functional loss from his/her baseline which was documented above under the "Functional History" and "Functional Status" headings.  Judging by the patient's diagnosis, physical exam, and functional history,  the patient has potential for functional progress which will result in measurable gains while on inpatient rehab.  These gains will be of substantial and practical use upon discharge  in facilitating mobility and self-care at the household level. 5. Physiatrist will provide 24 hour management of medical needs as well as oversight of the therapy plan/treatment and provide guidance as appropriate regarding the interaction of the two. 6. The Preadmission Screening has been reviewed and patient status is unchanged unless otherwise stated above. 7. 24 hour rehab nursing will assist with bladder management, bowel management, safety, skin/wound care, disease management, medication administration, pain management and patient education  and help integrate therapy concepts, techniques,education, etc. 8. PT will assess and treat for/with: Lower extremity strength, range of motion, stamina, balance, functional mobility, safety, adaptive techniques and equipment, NMR, orthotics, community reentry.   Goals are: mod I. 9. OT will assess and treat for/with:  ADL's, functional mobility, safety, upper extremity strength, adaptive techniques and equipment, NMR, family ed, community reentryGoals are: mod I to supervision. Therapy may proceed with showering this patient. 10. SLP will assess and treat for/with: speech, communication, community reintegration  Goals are: mod I. 11. Case Management and Social Worker will assess and treat for psychological issues and discharge planning. 12. Team conference will be held weekly to assess progress toward goals and to determine barriers to discharge. 13. Patient will receive at least 3 hours of therapy per  day at least 5 days per week. 14. ELOS: 10-14 days      15. Prognosis:  excellent   I have personally performed a face to face diagnostic evaluation of this patient and formulated the key components of the plan.  Additionally, I have personally reviewed laboratory data, imaging studies, as well as relevant notes and concur with the physician assistant's documentation above.  [name], MD, FAAPMR    Lavon Paganini [place], PA-C 01/18/2018

## 2018-01-18 NOTE — Progress Notes (Signed)
Physical Therapy Treatment Patient Details Name: [name] MRN: [id number] DOB: Oct 30, 1950 Today's Date: 01/18/2018    History of Present Illness Pt is a 67 y.o. male admitted to ED for L sided weakness, L facial numbness and fall. MRI significant for acute R MCA infarct, chronic R MCA infarct. PMH HLD, DM, CAD, alcohol abused, CHF, COPD, CVA with baseline left sided weakness.     PT Comments    Pt progressing well towards goals. Pt presents with L hemiparesis and decreased proprioception which is impairing his ability to transfer, amb and ascend/descend stairs independently and safely. PT amb 180 ft with min A, chair follow and one sitting rest break. First half pt maintained L LE externally rotated, required visual demonstration during sitting rest break to correct for second half of amb. In total pt performed 3 sit to stands, requiring [place] with safety and navigation of L UE & LE. Mod A for dynamic standing balance and assistance with pericare. Pt will benefit from skilled PT to increase his independence and safety with mobility to allow discharge to the venue listed below.     Follow Up Recommendations  CIR     Equipment Recommendations  None recommended by PT    Recommendations for Other Services       Precautions / Restrictions Precautions Precautions: Fall Restrictions Weight Bearing Restrictions: No    Mobility  Bed Mobility Overal bed mobility: Needs Assistance Bed Mobility: Supine to Sit     Supine to sit: HOB elevated;Min guard     General bed mobility comments: Pt able to get to EOB without assist, used R UE to pull up on bedrail, increased time needed  Transfers Overall transfer level: Needs assistance Equipment used: None Transfers: Sit to/from Stand Sit to Stand: Mod assist;Min assist;+2 physical assistance         General transfer comment: First sit to stand pt performed with min assist for power to stand and blocking of L LE, second  sit to stand pt required min A for navigating LEs to prepare to stand and modA for initiation and power to stand. Sit to stand at [facility] required mod A+2 for safety. Stand to sit to recliner, pt had mild LOB and descended quickly into chair.   Ambulation/Gait Ambulation/Gait assistance: Min assist;+2 safety/equipment Gait Distance (Feet): 180 Feet Assistive device: Straight cane Gait Pattern/deviations: Decreased dorsiflexion - left;Step-through pattern;Decreased step length - left Gait velocity: decreased Gait velocity interpretation: <1.8 ft/sec, indicate of risk for recurrent falls General Gait Details: Pt amb 180 feet with one sitting rest break at halfway. First half of amb, pt had notable ER in L LE so foot was pointing to L side, pt unable to correct with VCs as he could not see his foot or feel what was happening. PT demonstrated what he was doing & how to correct at rest break, pt maintained neutral positioning for majority of amb back to room. Pt needed to walk slower to maintain neutral positioning.   Stairs             Wheelchair Mobility    Modified Rankin (Stroke Patients Only) Modified Rankin (Stroke Patients Only) Pre-Morbid Rankin Score: Moderate disability Modified Rankin: Moderately severe disability     Balance Overall balance assessment: Needs assistance Sitting-balance support: Single extremity supported;Feet supported Sitting balance-Leahy Scale: Fair     Standing balance support: During functional activity;Single extremity supported Standing balance-Leahy Scale: Poor Standing balance comment: Pt requires support of RUE or min A  during static standing balance. Mod A for dynamic standing balance during pericare.                             Cognition Arousal/Alertness: Awake/alert Behavior During Therapy: WFL for tasks assessed/performed   Area of Impairment: Awareness                       Following Commands: Follows one step  commands consistently;Follows one step commands with increased time   Awareness: Emergent Problem Solving: Slow processing;Requires verbal cues;Requires tactile cues General Comments: Tactile cues helpful for navigating L UE & LE, pt responded well to demonstration of what his L LE was doing while amb (ER) and what we wanted him to do.      Exercises      General Comments        Pertinent Vitals/Pain Pain Assessment: Faces Faces Pain Scale: Hurts a little bit Pain Location: R calf Pain Descriptors / Indicators: Aching Pain Intervention(s): Monitored during session;Other (comment)(nurse alerted)    Home Living Family/patient expects to be discharged to:: Private residence Living Arrangements: Alone Available Help at Discharge: (PCS aide 9 am until 12 noon monday throough Friday) Type of Home: Apartment              Prior Function            PT Goals (current goals can now be found in the care plan section) Acute Rehab PT Goals Patient Stated Goal: to return home PT Goal Formulation: With patient Time For Goal Achievement: 01/27/18 Potential to Achieve Goals: Good Progress towards PT goals: Progressing toward goals    Frequency    Min 4X/week      PT Plan Current plan remains appropriate    Co-evaluation              AM-PAC PT "6 Clicks" Mobility   Outcome Measure  Help needed turning from your back to your side while in a flat bed without using bedrails?: A Little Help needed moving from lying on your back to sitting on the side of a flat bed without using bedrails?: A Little Help needed moving to and from a bed to a chair (including a wheelchair)?: A Little Help needed standing up from a chair using your arms (e.g., wheelchair or bedside chair)?: A Lot Help needed to walk in hospital room?: A Lot Help needed climbing 3-5 steps with a railing? : A Lot 6 Click Score: 15    End of Session Equipment Utilized During Treatment: Gait belt Activity  Tolerance: Patient tolerated treatment well Patient left: in chair;with call bell/phone within reach;with chair alarm set Nurse Communication: Mobility status;Other (comment)(open sores on behind, pt c/o of R calf pain) PT Visit Diagnosis: Hemiplegia and hemiparesis;Other abnormalities of gait and mobility (R26.89);Unsteadiness on feet (R26.81) Hemiplegia - Right/Left: Left Hemiplegia - dominant/non-dominant: Non-dominant Hemiplegia - caused by: Cerebral infarction     Time: 1152-1225 PT Time Calculation (min) (ACUTE ONLY): 33 min  Charges:  $Gait Training: 23-37 mins                     Gilda Crease, SPT Acute Rehab Services [place] 01/18/2018, 1:31 PM

## 2018-01-18 NOTE — Discharge Summary (Signed)
Physician Discharge Summary  [name] BSJ:[id number] DOB: [birth year] DOA: 01/12/2018  PCP: Charlott Rakes, MD  Admit date: 01/12/2018 Discharge date: 01/18/2018  Admitted From: Home Disposition:  CIR  Discharge Condition:Stable CODE STATUS: DNR Diet recommendation: Heart Healthy  Brief/Interim Summary: Patient is a 67 year old male with past medical history of coronary disease, chronic diastolic CHF, PVD, OSA, hypertension, diabetes, COPD with active tobacco abuse, recurrent hospitalization for embolic stroke, last hospitalization on September with multifocal right-sided infarct suspected due to chronically occluded right ICA  presented to the emergency department with complaints of 2-day history of increasing left-sided weakness, fall on the floor, inability to move his left lower extremity, left facial numbness. He has minimal left-sided residual hemiparesis from the previous stroke.  MRI/MRI of the brain showed multiple acute infarcts in the right MCA territory in addition to his chronic right MCA infarct.  TPA was not given as the patient was out of therapeutic window.  Neurology consulted.  Underwent  carotid/cerebral angiogram by IR which showed chronic right ICA stent occlusion .Work up complete. He will be discharged to CIR today.  Following problems were addressed during his hospitalization:  Acute embolic stroke: He has significant risk factors.  He has recurrent stroke,  ongoing tobacco abuse, cocaine abuse.  Other risks factors include hypertension, diabetes. He is also non complaint. Neurology evaluated him.  Continue aspirin, Plavix and statin. LDL of 98, hemoglobin A1 C 5.4. 2D echocardiogram showed ejection fraction of 55 to 76%, grade 1 diastolic dysfunction. Physical therapy evaluated him and recommended inpatient rehab.  He has been discharged to skilled nursing facility in the past after his previous stroke. Carotid/cerebral angiogram chronic right ICA stent  occlusion. Stroke work-up completed.  Polysubstance abuse/alcohol abuse: UDS positive for cocaine.  Counseled for cessation.    Diabetes mellitus type 2: Not on any medications at home.  Hemoglobin A1c is good.   Chronic diastolic CHF/coronary disease: Currently compensated.  Continue aspirin, statin.  Lasix and Coreg restarted.  COPD: Continue home inhalers.  Currently stable.  Fall with left-sided chest pain: No signs of rib fracture on chest x-ray.  PT evaluation done.  Right upper lobe nodule: Noncontrast CT is recommended as an outpatient.  Has been ordered.  History of seizure disorder: Continue Keppra  Nutrition Problem: Increased nutrient needs Etiology: chronic illness(CHF, COPD)  Patient is stable for discharge to CIR soon as the bed is available.   Discharge Diagnoses:  Principal Problem:   CVA (cerebral vascular accident) ([place]) Active Problems:   COPD (chronic obstructive pulmonary disease) ([place])   Chronic diastolic congestive heart failure ([place])   Alcoholism ([place])   Embolic stroke ([place])   Cocaine abuse ([place])   Intracranial carotid stenosis   Polysubstance abuse ([place])   Tobacco use disorder   Coronary artery disease involving native coronary artery of native heart without angina pectoris   Diabetes mellitus, type 2 [facility])    Discharge Instructions  Discharge Instructions    Ambulatory referral to Neurology   Complete by:  As directed    Follow up with Dr. Leonie Man at [facility] in 4-6 weeks. Too complicated for RN to follow. Thanks.   Diet - low sodium heart healthy   Complete by:  As directed    Discharge instructions   Complete by:  As directed    1) Continue medications as recommended. 2)Follow up with neurology after discharge from inpatient rehab.   Increase activity slowly   Complete by:  As directed      Allergies  as of 01/18/2018      Reactions   Darvocet [propoxyphene N-acetaminophen] Hives   Haldol [haloperidol Decanoate] Hives    Acetaminophen Nausea Only   Upset stomach, tolerates Hydrocodone/APAP if taken with food   Metformin And Related Nausea And Vomiting   Norco [hydrocodone-acetaminophen] Nausea And Vomiting   Tolerates if taken with food      Medication List    TAKE these medications   ACCU-CHEK AVIVA device Use 3 times daily before meals.   accu-chek softclix lancets Use 3 times daily before meals   ACCU-CHEK SOFTCLIX LANCETS lancets Use 3 times daily before meals   albuterol 108 (90 Base) MCG/ACT inhaler Commonly known as:  PROVENTIL HFA;VENTOLIN HFA INHALE 2 PUFFS EVERY 6 HOURS AS NEEDED FOR WHEEZING OR SHORTNESS OF BREATH What changed:    how much to take  how to take this  when to take this  reasons to take this  additional instructions   allopurinol 300 MG tablet Commonly known as:  ZYLOPRIM Take 1 tablet (300 mg total) by mouth daily.   aspirin 325 MG tablet Take 1 tablet (325 mg total) by mouth daily. What changed:  how much to take   atorvastatin 80 MG tablet Commonly known as:  LIPITOR Take 1 tablet (80 mg total) by mouth daily at 6 PM.   carvedilol 12.5 MG tablet Commonly known as:  COREG Take 1 tablet (12.5 mg total) by mouth 2 (two) times daily with a meal.   clopidogrel 75 MG tablet Commonly known as:  PLAVIX Take 1 tablet (75 mg total) by mouth daily.   DULoxetine 60 MG capsule Commonly known as:  CYMBALTA Take 1 capsule (60 mg total) by mouth daily.   Fluticasone-Salmeterol 250-50 MCG/DOSE Aepb Commonly known as:  ADVAIR Inhale 1 puff into the lungs 2 (two) times daily.   folic acid 1 MG tablet Commonly known as:  FOLVITE Take 1 tablet (1 mg total) by mouth daily. Start taking on:  01/19/2018   furosemide 20 MG tablet Commonly known as:  LASIX Take 2 tablets (40 mg total) by mouth 2 (two) times daily.   glucose blood test strip Use 3 times daily before meals as directed.   hydrocortisone cream 1 % Apply 1 application topically 2 (two) times  daily as needed for itching.   hydrOXYzine 25 MG tablet Commonly known as:  ATARAX/VISTARIL Take 25 mg by mouth every 6 (six) hours as needed for anxiety or itching.   lactulose 10 GM/15ML solution Commonly known as:  CHRONULAC Take 15 mLs (10 g total) by mouth 2 (two) times daily as needed for mild constipation.   levETIRAcetam 500 MG tablet Commonly known as:  KEPPRA Take 1 tablet (500 mg total) by mouth 2 (two) times daily.   memantine 10 MG tablet Commonly known as:  NAMENDA Take 1 tablet (10 mg total) by mouth 2 (two) times daily.   naphazoline-pheniramine 0.025-0.3 % ophthalmic solution Commonly known as:  NAPHCON-A Place 1 drop into the left eye 4 (four) times daily as needed for eye irritation.   pantoprazole 40 MG tablet Commonly known as:  PROTONIX Take 1 tablet (40 mg total) by mouth daily.   pregabalin 150 MG capsule Commonly known as:  LYRICA Take 1 capsule (150 mg total) by mouth 2 (two) times daily.   rOPINIRole 0.5 MG tablet Commonly known as:  REQUIP Take 1 tablet (0.5 mg total) by mouth at bedtime.   thiamine 100 MG tablet Take 1 tablet (100 mg total)  by mouth daily. Start taking on:  01/19/2018   tiZANidine 4 MG tablet Commonly known as:  ZANAFLEX Take 1 tablet (4 mg total) by mouth every 8 (eight) hours as needed for muscle spasms.   traZODone 150 MG tablet Commonly known as:  DESYREL Take 1 tablet (150 mg total) by mouth at bedtime as needed for sleep.      Follow-up Information    Garvin Fila, MD. Schedule an appointment as soon as possible for a visit in 4 week(s).   Specialties:  Neurology, Radiology Contact information: [address] [place] [address] [phone]          Allergies  Allergen Reactions  . Darvocet [Propoxyphene N-Acetaminophen] Hives  . Haldol [Haloperidol Decanoate] Hives  . Acetaminophen Nausea Only    Upset stomach, tolerates Hydrocodone/APAP if taken with food  . Metformin And Related  Nausea And Vomiting  . Norco [Hydrocodone-Acetaminophen] Nausea And Vomiting    Tolerates if taken with food    Consultations:  Neurology   Procedures/Studies: Dg Chest 2 View  Result Date: 01/12/2018 CLINICAL DATA:  67 year old male with weakness. EXAM: CHEST - 2 VIEW COMPARISON:  Chest radiograph dated 11/28/2017 FINDINGS: There is no new consolidation, pleural effusion, or pneumothorax. Bibasilar atelectasis noted. There is pleural thickening or a small loculated collection along the right lateral pleural surface, similar to prior radiograph. A 9 mm right upper lobe nodule again noted as seen previously. There is cardiomegaly. No acute osseous pathology. IMPRESSION: 1. No acute cardiopulmonary process. 2. Cardiomegaly. 3. Small loculated pleural effusion versus pleural thickening along the right lateral pleural surface. 4. A 9 mm right upper lobe nodule as seen previously. Chest CT may provide better evaluation on a nonemergent basis. Electronically Signed   By: Anner Crete M.D.   On: 01/12/2018 21:13   Ct Head Wo Contrast  Result Date: 01/12/2018 CLINICAL DATA:  Altered level of consciousness EXAM: CT HEAD WITHOUT CONTRAST TECHNIQUE: Contiguous axial images were obtained from the base of the skull through the vertex without intravenous contrast. COMPARISON:  MRI 11/29/2017 FINDINGS: Brain: Old right frontoparietal infarct, stable. There is atrophy and chronic small vessel disease changes. No acute intracranial abnormality. Specifically, no hemorrhage, hydrocephalus, mass lesion, acute infarction, or significant intracranial injury. Vascular: No hyperdense vessel or unexpected calcification. Skull: No acute calvarial abnormality. Sinuses/Orbits: No acute finding. Postoperative changes on the medial left orbital wall and orbital floor with mesh, stable. Other: None IMPRESSION: Old right frontal/parietal infarcts. No acute intracranial abnormality. Atrophy, chronic microvascular disease.  Electronically Signed   By: Rolm Baptise M.D.   On: 01/12/2018 20:39   Mr Brain Wo Contrast  Result Date: 01/13/2018 CLINICAL DATA:  LEFT-sided weakness after fall. History of stroke, alcohol abuse, hypertension, hyperlipidemia and diabetes. EXAM: MRI HEAD WITHOUT CONTRAST TECHNIQUE: Multiplanar, multiecho pulse sequences of the brain and surrounding structures were obtained without intravenous contrast. COMPARISON:  CT HEAD January 12, 2018 and MRI of the head November 29, 2017 FINDINGS: Mild motion degraded examination. INTRACRANIAL CONTENTS: Patchy reduced diffusion RIGHT frontal lobe towards the convexity with low ADC values. RIGHT frontoparietal encephalomalacia. Scattered chronic microhemorrhages and RIGHT superficial siderosis. Mild ex vacuo dilatation RIGHT lateral ventricle. Mild parenchymal brain volume loss. No hydrocephalus. Linear T2 bright signal RIGHT cerebral peduncle seen with wallerian degeneration. Patchy supratentorial and pontine white matter FLAIR T2 hyperintensities. No midline shift, mass effect or masses. VASCULAR: Similar loss of RIGHT internal carotid artery flow void to level of the carotid terminus. SKULL AND  UPPER CERVICAL SPINE: No abnormal sellar expansion. No suspicious calvarial bone marrow signal. Craniocervical junction maintained. SINUSES/ORBITS: Minimal fluid signal RIGHT petrous apex. Chronic LEFT maxillary sinusitis with atresia. Mild paranasal sinus mucosal thickening. The included ocular globes and orbital contents are non-suspicious. Status post bilateral ocular lens implants. OTHER: Patient is edentulous. IMPRESSION: 1. Mild motion degraded examination. Multifocal acute small RIGHT frontal/MCA territory infarcts. 2. Old RIGHT MCA territory infarct.  Chronically occluded RIGHT ICA. 3. Similar parenchymal brain volume loss and mild-to-moderate chronic small vessel ischemic changes. 4. Stable chronic microhemorrhages and RIGHT superficial siderosis. Electronically Signed    By: Elon Alas M.D.   On: 01/13/2018 01:43   Ir US Guide Vasc Access Right  Result Date: 01/17/2018 CLINICAL DATA:  Right cerebral hemispheric watershed infarcts. Left-sided weakness. Occluded right internal carotid artery stent. EXAM: IR ULTRASOUND GUIDANCE VASC ACCESS RIGHT; BILATERAL COMMON CAROTID AND INNOMINATE ANGIOGRAPHY; IR ANGIO VERTEBRAL SEL VERTEBRAL BILAT MOD SED COMPARISON:  CT of the head and neck of 01/12/2018. MEDICATIONS: Heparin 2000 units IV; no antibiotic was administered within 1 hour of the procedure. ANESTHESIA/SEDATION: Versed 1 mg IV; Fentanyl 25 mcg IV Moderate Sedation Time:  36 minutes The patient was continuously monitored during the procedure by the interventional radiology nurse under my direct supervision. CONTRAST:  Isovue 300 approximately 55 mL FLUOROSCOPY TIME:  Fluoroscopy Time: 12 minutes 54 seconds (916 mGy). COMPLICATIONS: None immediate. TECHNIQUE: Informed written consent was obtained from the patient after a thorough discussion of the procedural risks, benefits and alternatives. All questions were addressed. Maximal Sterile Barrier Technique was utilized including caps, mask, sterile gowns, sterile gloves, sterile drape, hand hygiene and skin antiseptic. A timeout was performed prior to the initiation of the procedure. The right forearm to the wrist was prepped and draped in the usual sterile fashion. Thereafter, the radial artery was defined by ultrasound. The Barbeau test confirmed dorsal palmar anastomosis. Thereafter using ultrasound guidance, a transradial access into the right radial artery was obtained with an 18 gauge pediatric needle. This was then followed by the advancement of an 0.018 inch guidewire over which a 4/5 French radial sheath was advanced. The wire was removed. The sheath was then connected to continuous heparinized saline infusion. A mixture of 2000 units of heparin, 2.5 mg of verapamil, and 200 mcg of nitroglycerin were then injected  slowly in a diluted form without any difficulty. An angiogram was then performed through the radial sheath which confirmed patency of the radial artery into the brachial artery. Over a 0.035 inch Roadrunner guidewire, a 5 [place] Simmons 2 diagnostic catheter was then advanced to the distal subclavian artery. The guidewire was removed. Good aspiration obtained from the hub of the diagnostic catheter. An arteriogram was then performed to verify patency of the right vertebral artery. Selective cannulation was then performed after the [facility] catheter had been performed in the aortic arch region. Selective cannulation was performed of the left common carotid artery, the left vertebral artery, the right common carotid artery and the right vertebral artery. Following the procedure, the sheath was removed and a wrist band was then applied successfully. Distal radial pulses were palpable at the end of the procedure. FINDINGS: The right vertebral artery origin is widely patent. The vessel is seen to opacify to the cranial skull base. Wide patency is seen of the right vertebrobasilar junction and the right posterior-inferior cerebellar artery. There is mild atherosclerotic irregularity involving the right vertebrobasilar junction proximal to the right posterior-inferior cerebellar artery. The basilar artery, the posterior  cerebral arteries, the superior cerebellar arteries and the anterior-inferior cerebellar arteries is grossly normal into the capillary and venous phases. Unopacified blood is seen and in the basilar artery from the contralateral vertebral artery. Also demonstrated is retrograde opacification of the right posterior communicating artery of the right internal carotid artery supraclinoid segment with subsequent opacification of the right MCA distribution. The left common carotid arteriogram demonstrates mild atherosclerotic irregularity of the distal left common carotid artery proximal to the left internal  carotid artery bulb. There is nonvisualization of the left external carotid artery circulation. More distally, the left internal carotid artery is seen to opacify to the cranial skull base. There is mild narrowing of the proximal cavernous segment of the left internal carotid artery. The distal cavernous, and supraclinoid segments are widely patent. The left middle cerebral artery and the left anterior cerebral artery opacify into the capillary and venous phases. There is cross-filling via the anterior communicating artery of the right anterior cerebral artery A2 segment and distally, and also the right A1 segment and the right middle cerebral artery distribution mixing with unopacified blood from the posterior circulation as described is noted in the right MCA distribution. The left vertebral artery origin is widely patent. The vessel is seen to opacify to the cranial skull base. Mild caliber irregularity is seen of the mid cervical segment of the left vertebral artery. More distally, wide patency is seen the left posterior-inferior cerebellar artery and the left vertebrobasilar junction. Opacified portion of the basilar artery, the right posterior cerebral arteries, the superior cerebellar arteries and the anterior-inferior cerebellar arteries is noted into the capillary and venous phases. Unopacified blood is seen in the basilar artery from the contralateral vertebral artery. Also demonstrated is a prominent muscular branch of the distal left vertebral artery which retrogradely opacifies the occipital artery and subsequently the left external carotid artery branches. However, there is no opacification of the left external carotid artery origin. The right common carotid arteriogram demonstrates the right external carotid artery and its major branches to be widely patent. The right internal carotid artery and the proximal portion of the stent is completely occluded. The delayed images demonstrate no evidence of a  string sign. However, there is partial reconstitution of the right internal carotid artery supraclinoid segment via the right ophthalmic artery with collaterals from the nasolacrimal branches. Faint opacification of the right middle cerebral artery distribution is noted. 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 Partial reconstitution of the right internal carotid artery supraclinoid segment from the ophthalmic artery via the nasolacrimal collaterals. Retrograde opacification of the right internal carotid artery supraclinoid segment from the posterior circulation via the posterior communicating artery with subsequently into the right middle cerebral artery. Partially collateralization of the right MCA distribution and the right anterior cerebral artery distribution via the anterior communicating artery from the left internal carotid artery. Angiographically occluded left external carotid artery at its origin, with reconstitution via the left occipital artery from the left vertebral artery. PLAN: Angiographic findings were reviewed with the patient. Electronically Signed   By: Luanne Bras M.D.   On: 01/14/2018 15:37   Ir Angio Intra Extracran Sel Com Carotid Innominate Bilat Mod Sed  Result Date: 01/17/2018 CLINICAL DATA:  Right cerebral hemispheric watershed infarcts. Left-sided weakness. Occluded right internal carotid artery stent. EXAM: IR ULTRASOUND GUIDANCE VASC ACCESS RIGHT; BILATERAL COMMON CAROTID AND INNOMINATE ANGIOGRAPHY; IR ANGIO VERTEBRAL SEL VERTEBRAL BILAT MOD  SED COMPARISON:  CT of the head and neck of 01/12/2018. MEDICATIONS: Heparin 2000 units IV; no antibiotic was administered within 1 hour of the procedure. ANESTHESIA/SEDATION: Versed 1 mg IV; Fentanyl 25 mcg IV Moderate Sedation Time:  36 minutes The patient was continuously monitored during the procedure by the  interventional radiology nurse under my direct supervision. CONTRAST:  Isovue 300 approximately 55 mL FLUOROSCOPY TIME:  Fluoroscopy Time: 12 minutes 54 seconds (916 mGy). COMPLICATIONS: None immediate. TECHNIQUE: Informed written consent was obtained from the patient after a thorough discussion of the procedural risks, benefits and alternatives. All questions were addressed. Maximal Sterile Barrier Technique was utilized including caps, mask, sterile gowns, sterile gloves, sterile drape, hand hygiene and skin antiseptic. A timeout was performed prior to the initiation of the procedure. The right forearm to the wrist was prepped and draped in the usual sterile fashion. Thereafter, the radial artery was defined by ultrasound. The Barbeau test confirmed dorsal palmar anastomosis. Thereafter using ultrasound guidance, a transradial access into the right radial artery was obtained with an 18 gauge pediatric needle. This was then followed by the advancement of an 0.018 inch guidewire over which a 4/5 French radial sheath was advanced. The wire was removed. The sheath was then connected to continuous heparinized saline infusion. A mixture of 2000 units of heparin, 2.5 mg of verapamil, and 200 mcg of nitroglycerin were then injected slowly in a diluted form without any difficulty. An angiogram was then performed through the radial sheath which confirmed patency of the radial artery into the brachial artery. Over a 0.035 inch Roadrunner guidewire, a 5 [place] Simmons 2 diagnostic catheter was then advanced to the distal subclavian artery. The guidewire was removed. Good aspiration obtained from the hub of the diagnostic catheter. An arteriogram was then performed to verify patency of the right vertebral artery. Selective cannulation was then performed after the [facility] catheter had been performed in the aortic arch region. Selective cannulation was performed of the left common carotid artery, the left vertebral artery, the  right common carotid artery and the right vertebral artery. Following the procedure, the sheath was removed and a wrist band was then applied successfully. Distal radial pulses were palpable at the end of the procedure. FINDINGS: The right vertebral artery origin is widely patent. The vessel is seen to opacify to the cranial skull base. Wide patency is seen of the right vertebrobasilar junction and the right posterior-inferior cerebellar artery. There is mild atherosclerotic irregularity involving the right vertebrobasilar junction proximal to the right posterior-inferior cerebellar artery. The basilar artery, the posterior cerebral arteries, the superior cerebellar arteries and the anterior-inferior cerebellar arteries is grossly normal into the capillary and venous phases. Unopacified blood is seen and in the basilar artery from the contralateral vertebral artery. Also demonstrated is retrograde opacification of the right posterior communicating artery of the right internal carotid artery supraclinoid segment with subsequent opacification of the right MCA distribution. The left common carotid arteriogram demonstrates mild atherosclerotic irregularity of the distal left common carotid artery proximal to the left internal carotid artery bulb. There is nonvisualization of the left external carotid artery circulation. More distally, the left internal carotid artery is seen to opacify to the cranial skull base. There is mild narrowing of the proximal cavernous segment of the left internal carotid artery. The distal cavernous, and supraclinoid segments are widely patent. The left middle cerebral artery and the left anterior cerebral artery opacify into the capillary and venous phases. There is cross-filling via the anterior communicating  artery of the right anterior cerebral artery A2 segment and distally, and also the right A1 segment and the right middle cerebral artery distribution mixing with unopacified blood from  the posterior circulation as described is noted in the right MCA distribution. The left vertebral artery origin is widely patent. The vessel is seen to opacify to the cranial skull base. Mild caliber irregularity is seen of the mid cervical segment of the left vertebral artery. More distally, wide patency is seen the left posterior-inferior cerebellar artery and the left vertebrobasilar junction. Opacified portion of the basilar artery, the right posterior cerebral arteries, the superior cerebellar arteries and the anterior-inferior cerebellar arteries is noted into the capillary and venous phases. Unopacified blood is seen in the basilar artery from the contralateral vertebral artery. Also demonstrated is a prominent muscular branch of the distal left vertebral artery which retrogradely opacifies the occipital artery and subsequently the left external carotid artery branches. However, there is no opacification of the left external carotid artery origin. The right common carotid arteriogram demonstrates the right external carotid artery and its major branches to be widely patent. The right internal carotid artery and the proximal portion of the stent is completely occluded. The delayed images demonstrate no evidence of a string sign. However, there is partial reconstitution of the right internal carotid artery supraclinoid segment via the right ophthalmic artery with collaterals from the nasolacrimal branches. Faint opacification of the right middle cerebral artery distribution is noted. 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 Partial reconstitution of the right internal carotid artery supraclinoid segment from the ophthalmic artery via the nasolacrimal collaterals. Retrograde opacification of the right internal carotid artery supraclinoid segment from the posterior circulation via the  posterior communicating artery with subsequently into the right middle cerebral artery. Partially collateralization of the right MCA distribution and the right anterior cerebral artery distribution via the anterior communicating artery from the left internal carotid artery. Angiographically occluded left external carotid artery at its origin, with reconstitution via the left occipital artery from the left vertebral artery. PLAN: Angiographic findings were reviewed with the patient. Electronically Signed   By: Luanne Bras M.D.   On: 01/14/2018 15:37   Ir Angio Vertebral Sel Vertebral Bilat Mod Sed  Result Date: 01/17/2018 CLINICAL DATA:  Right cerebral hemispheric watershed infarcts. Left-sided weakness. Occluded right internal carotid artery stent. EXAM: IR ULTRASOUND GUIDANCE VASC ACCESS RIGHT; BILATERAL COMMON CAROTID AND INNOMINATE ANGIOGRAPHY; IR ANGIO VERTEBRAL SEL VERTEBRAL BILAT MOD SED COMPARISON:  CT of the head and neck of 01/12/2018. MEDICATIONS: Heparin 2000 units IV; no antibiotic was administered within 1 hour of the procedure. ANESTHESIA/SEDATION: Versed 1 mg IV; Fentanyl 25 mcg IV Moderate Sedation Time:  36 minutes The patient was continuously monitored during the procedure by the interventional radiology nurse under my direct supervision. CONTRAST:  Isovue 300 approximately 55 mL FLUOROSCOPY TIME:  Fluoroscopy Time: 12 minutes 54 seconds (916 mGy). COMPLICATIONS: None immediate. TECHNIQUE: Informed written consent was obtained from the patient after a thorough discussion of the procedural risks, benefits and alternatives. All questions were addressed. Maximal Sterile Barrier Technique was utilized including caps, mask, sterile gowns, sterile gloves, sterile drape, hand hygiene and skin antiseptic. A timeout was performed prior to the initiation of the procedure. The right forearm to the wrist was prepped and draped in the usual sterile fashion. Thereafter, the radial artery was defined  by ultrasound. The Barbeau test confirmed dorsal  palmar anastomosis. Thereafter using ultrasound guidance, a transradial access into the right radial artery was obtained with an 18 gauge pediatric needle. This was then followed by the advancement of an 0.018 inch guidewire over which a 4/5 French radial sheath was advanced. The wire was removed. The sheath was then connected to continuous heparinized saline infusion. A mixture of 2000 units of heparin, 2.5 mg of verapamil, and 200 mcg of nitroglycerin were then injected slowly in a diluted form without any difficulty. An angiogram was then performed through the radial sheath which confirmed patency of the radial artery into the brachial artery. Over a 0.035 inch Roadrunner guidewire, a 5 [place] Simmons 2 diagnostic catheter was then advanced to the distal subclavian artery. The guidewire was removed. Good aspiration obtained from the hub of the diagnostic catheter. An arteriogram was then performed to verify patency of the right vertebral artery. Selective cannulation was then performed after the [facility] catheter had been performed in the aortic arch region. Selective cannulation was performed of the left common carotid artery, the left vertebral artery, the right common carotid artery and the right vertebral artery. Following the procedure, the sheath was removed and a wrist band was then applied successfully. Distal radial pulses were palpable at the end of the procedure. FINDINGS: The right vertebral artery origin is widely patent. The vessel is seen to opacify to the cranial skull base. Wide patency is seen of the right vertebrobasilar junction and the right posterior-inferior cerebellar artery. There is mild atherosclerotic irregularity involving the right vertebrobasilar junction proximal to the right posterior-inferior cerebellar artery. The basilar artery, the posterior cerebral arteries, the superior cerebellar arteries and the anterior-inferior cerebellar  arteries is grossly normal into the capillary and venous phases. Unopacified blood is seen and in the basilar artery from the contralateral vertebral artery. Also demonstrated is retrograde opacification of the right posterior communicating artery of the right internal carotid artery supraclinoid segment with subsequent opacification of the right MCA distribution. The left common carotid arteriogram demonstrates mild atherosclerotic irregularity of the distal left common carotid artery proximal to the left internal carotid artery bulb. There is nonvisualization of the left external carotid artery circulation. More distally, the left internal carotid artery is seen to opacify to the cranial skull base. There is mild narrowing of the proximal cavernous segment of the left internal carotid artery. The distal cavernous, and supraclinoid segments are widely patent. The left middle cerebral artery and the left anterior cerebral artery opacify into the capillary and venous phases. There is cross-filling via the anterior communicating artery of the right anterior cerebral artery A2 segment and distally, and also the right A1 segment and the right middle cerebral artery distribution mixing with unopacified blood from the posterior circulation as described is noted in the right MCA distribution. The left vertebral artery origin is widely patent. The vessel is seen to opacify to the cranial skull base. Mild caliber irregularity is seen of the mid cervical segment of the left vertebral artery. More distally, wide patency is seen the left posterior-inferior cerebellar artery and the left vertebrobasilar junction. Opacified portion of the basilar artery, the right posterior cerebral arteries, the superior cerebellar arteries and the anterior-inferior cerebellar arteries is noted into the capillary and venous phases. Unopacified blood is seen in the basilar artery from the contralateral vertebral artery. Also demonstrated is a  prominent muscular branch of the distal left vertebral artery which retrogradely opacifies the occipital artery and subsequently the left external carotid artery branches. However, there is  no opacification of the left external carotid artery origin. The right common carotid arteriogram demonstrates the right external carotid artery and its major branches to be widely patent. The right internal carotid artery and the proximal portion of the stent is completely occluded. The delayed images demonstrate no evidence of a string sign. However, there is partial reconstitution of the right internal carotid artery supraclinoid segment via the right ophthalmic artery with collaterals from the nasolacrimal branches. Faint opacification of the right middle cerebral artery distribution is noted. 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 Partial reconstitution of the right internal carotid artery supraclinoid segment from the ophthalmic artery via the nasolacrimal collaterals. Retrograde opacification of the right internal carotid artery supraclinoid segment from the posterior circulation via the posterior communicating artery with subsequently into the right middle cerebral artery. Partially collateralization of the right MCA distribution and the right anterior cerebral artery distribution via the anterior communicating artery from the left internal carotid artery. Angiographically occluded left external carotid artery at its origin, with reconstitution via the left occipital artery from the left vertebral artery. PLAN: Angiographic findings were reviewed with the patient. Electronically Signed   By: Luanne Bras M.D.   On: 01/14/2018 15:37       Subjective: Patient seen and examined the bedside this morning.  Remains hemodynamically stable.  Stable for discharge to CIR today  Discharge  Exam: Vitals:   01/18/18 0838 01/18/18 1247  BP:  (!) 113/58  Pulse:  96  Resp:  18  Temp:  97.8 F (36.6 C)  SpO2: 94% 98%   Vitals:   01/18/18 0339 01/18/18 0749 01/18/18 0838 01/18/18 1247  BP: (!) 168/76 (!) 161/83  (!) 113/58  Pulse: 75 79  96  Resp: 17 18  18   Temp: 98 F (36.7 C) 97.8 F (36.6 C)  97.8 F (36.6 C)  TempSrc:  Oral  Oral  SpO2: 95% 93% 94% 98%  Weight:      Height:        General: Pt is alert, awake, not in acute distress Cardiovascular: RRR, S1/S2 +, no rubs, no gallops Respiratory: CTA bilaterally, no wheezing, no rhonchi Abdominal: Soft, NT, ND, bowel sounds + Extremities: no edema, no cyanosis    The results of significant diagnostics from this hospitalization (including imaging, microbiology, ancillary and laboratory) are listed below for reference.     Microbiology: No results found for this or any previous visit (from the past 240 hour(s)).   Labs: BNP (last 3 results) Recent Labs    09/19/17 2055 10/25/17 2100  BNP 90.2 94.7   Basic Metabolic Panel: Recent Labs  Lab 01/12/18 2111 01/12/18 2125 01/15/18 0529  NA 144 144 144  K 3.7 4.1 3.8  CL 107 108 108  CO2 33*  --  28  GLUCOSE 121* 115* 118*  BUN 13 18 16   CREATININE 1.08 1.00 0.93  CALCIUM 9.6  --  9.5   Liver Function Tests: Recent Labs  Lab 01/12/18 2111  AST 14*  ALT 13  ALKPHOS 78  BILITOT 0.6  PROT 6.5  ALBUMIN 3.6   No results for input(s): LIPASE, AMYLASE in the last 168 hours. No results for input(s): AMMONIA in the last 168 hours. CBC: Recent Labs  Lab 01/12/18 2111 01/12/18 2125 01/15/18 0529  WBC 6.7  --  6.3  NEUTROABS 4.4  --  4.0  HGB 13.4 15.3 11.9*  HCT  45.1 45.0 38.6*  MCV 90.6  --  88.7  PLT 220  --  165   Cardiac Enzymes: No results for input(s): CKTOTAL, CKMB, CKMBINDEX, TROPONINI in the last 168 hours. BNP: Invalid input(s): POCBNP CBG: Recent Labs  Lab 01/17/18 1248 01/17/18 1648 01/17/18 2125 01/18/18 0616  01/18/18 1244  GLUCAP 136* 116* 114* 96 184*   D-Dimer No results for input(s): DDIMER in the last 72 hours. Hgb A1c No results for input(s): HGBA1C in the last 72 hours. Lipid Profile No results for input(s): CHOL, HDL, LDLCALC, TRIG, CHOLHDL, LDLDIRECT in the last 72 hours. Thyroid function studies No results for input(s): TSH, T4TOTAL, T3FREE, THYROIDAB in the last 72 hours.  Invalid input(s): FREET3 Anemia work up No results for input(s): VITAMINB12, FOLATE, FERRITIN, TIBC, IRON, RETICCTPCT in the last 72 hours. Urinalysis    Component Value Date/Time   COLORURINE YELLOW 01/12/2018 2117   APPEARANCEUR CLEAR 01/12/2018 2117   LABSPEC 1.009 01/12/2018 2117   PHURINE 6.0 01/12/2018 2117   GLUCOSEU NEGATIVE 01/12/2018 2117   HGBUR NEGATIVE 01/12/2018 2117   BILIRUBINUR NEGATIVE 01/12/2018 2117   KETONESUR NEGATIVE 01/12/2018 2117   PROTEINUR NEGATIVE 01/12/2018 2117   UROBILINOGEN 0.2 08/19/2014 2103   NITRITE NEGATIVE 01/12/2018 2117   LEUKOCYTESUR NEGATIVE 01/12/2018 2117   Sepsis Labs Invalid input(s): PROCALCITONIN,  WBC,  LACTICIDVEN Microbiology No results found for this or any previous visit (from the past 240 hour(s)).  Please note: You were cared for by a hospitalist during your hospital stay. Once you are discharged, your primary care physician will handle any further medical issues. Please note that NO REFILLS for any discharge medications will be authorized once you are discharged, as it is imperative that you return to your primary care physician (or establish a relationship with a primary care physician if you do not have one) for your post hospital discharge needs so that they can reassess your need for medications and monitor your lab values.    Time coordinating discharge: 40 minutes  SIGNED:   Shelly Coss, MD  [facility] 01/18/2018, 1:20 PM Pager [phone]  If 7PM-7AM, please contact night-coverage [email or web address] Password [email or web address]

## 2018-01-18 NOTE — Progress Notes (Signed)
Inpatient Rehabilitation Admissions Coordinator  I have insurance approval to admit to CIR today. I met with pt at bedside and he is in agreement. I will notify RN, MD, RN CM and SW to make the arrangements to admit today. Patient's cell phone is in need of charging. I will charge and return to patient today .  Danne Baxter, RN, MSN Rehab Admissions Coordinator [phone] 01/18/2018 12:59 PM

## 2018-01-18 NOTE — PMR Pre-admission (Addendum)
PMR Admission Coordinator Pre-Admission Assessment  Patient: [name] is an 67 y.o., male MRN: [id number] DOB: 09-Jul-1950 Height: 6' (182.9 cm) Weight: 98.9 kg              Insurance Information HMO: yes   PPO:      PCP:      IPA:      80/20:      OTHER: medicare advantage plan PRIMARY: [place] [employer]      Policy#: [id number]      Subscriber: pt CM Name: Lenna Sciara      Phone#: [phone]     Fax#: [phone] Pre-Cert#: [id number] approved for 7 days with f/u Dorthula Nettles      Employer: disabled Benefits:  Phone #: [phone]     Name: 01/17/2018 Eff. Date: 01/09/2018     Deduct: none      Out of Pocket Max: [phone]      Life Max: none CIR: $500 co pay per admit/Has Medicaid      SNF: no co pay days 1 until 20; $170.50 co pay per day days 21 until 100 Outpatient: no co pay per visit     Co-Pay: visits per medical neccesity Home Health: 100%      Co-Pay: visits per medical neccesity DME: 80%     Co-Pay: 20% Providers: in network  SECONDARY: Medicaid of [place]      Policy#: [id number]      Subscriber: pt  Medicaid Application Date:       Case Manager:  Disability Application Date:       Case Worker:   Emergency Contact Information Contact Information    Name Relation Home Work Mobile   [name] Friend   [phone]    Friend is also pt's PCS aide  Current Medical History  Patient Admitting Diagnosis: right frontal infarcts  History of Present Illness:  [name] is a 67 year old right-handed male with history of diabetes mellitus,seizure disorder maintained on Keppra CAD, polysubstance and alcohol abuse, diastolic congestive heart failure, COPD, chronically occluded right ICA, recent CVA with left-sided residual weakness maintained on aspirin and Plavix and was discharged to [facility] facility for 3 weeks before returning home. Per chart review patient lives at [facility] with assistance from a home health aide.  He was using a walker while at home with noted falls.Presented 01/13/2018 with increasing weakness 2-3 days.UDS positive cocaine. MRI showed multifocal acute small right frontal MCA territory infarctions. Old right MCA territory infarcts. Cerebral angiogram again confirmed occluded right ICA stent without evidence of string sign and no plan for further intervention. Echocardiogram with ejection fraction of 55-60% without emboli.Chest x-ray with incidental finding of right upper lobe nodule plan follow-up outpatient. Neurology follow-up currently maintained on aspirin and Plavix for CVA prophylaxis. Subcutaneous Lovenox for DVT prophylaxis. Tolerating a regular consistency diet.  Complete NIHSS TOTAL: 4    Past Medical History  Past Medical History:  Diagnosis Date  . Alcohol abuse    H/O  . Anxiety   . Arthritis   . Asthma   . CAD (coronary artery disease) 05/27/10   Cath: severe single vessell CAD left cx midportion obtuse marginal 2 to 3.  [name] CHF (congestive heart failure) ([place])   . COPD (chronic obstructive pulmonary disease) ([place])   . COPD (chronic obstructive pulmonary disease) ([place])   . Depression   . Diabetes mellitus, type 2 ([place]) 03/13/2015  . GERD (gastroesophageal reflux disease)   . HCAP (healthcare-associated  pneumonia) 10/15/2015  . History of DVT of lower extremity   . Hyperlipemia   . Hyperlipidemia   . Hypertension   . Myocardial infarction ([place]) 2000  . Orbital fracture 12/2012  . OSA on CPAP   . Peripheral vascular disease, unspecified ([place])    08/20/10 doppler: increase in right ABI post-op. Left ABI stable. S/P bi-fem bypass surgery  . Pneumonia 04/04/2012  . Shortness of breath   . Stroke ([place])   . Tobacco abuse     Family History  family history includes Cancer in his brother; Cirrhosis in his father; Heart attack in his mother; Heart failure in his brother and mother.  Prior Rehab/Hospitalizations:  Has the patient had major surgery during 100 days prior to  admission? No   Recent admit to [facility] he calls ALF  Current Medications   Current Facility-Administered Medications:  .  acetaminophen (TYLENOL) tablet 650 mg, 650 mg, Oral, Q4H PRN **OR** acetaminophen (TYLENOL) solution 650 mg, 650 mg, Per Tube, Q4H PRN **OR** acetaminophen (TYLENOL) suppository 650 mg, 650 mg, Rectal, Q4H PRN, Smith, Rondell A, MD .  albuterol (PROVENTIL) (2.5 MG/3ML) 0.083% nebulizer solution 2.5 mg, 2.5 mg, Inhalation, Q6H PRN, Dhungel, Nishant, MD .  allopurinol (ZYLOPRIM) tablet 300 mg, 300 mg, Oral, Daily, Dhungel, Nishant, MD, 300 mg at 01/18/18 1108 .  aspirin suppository 300 mg, 300 mg, Rectal, Daily **OR** aspirin tablet 325 mg, 325 mg, Oral, Daily, Smith, Rondell A, MD, 325 mg at 01/18/18 1109 .  atorvastatin (LIPITOR) tablet 80 mg, 80 mg, Oral, q1800, Dhungel, Nishant, MD, 80 mg at 01/17/18 1826 .  carvedilol (COREG) tablet 12.5 mg, 12.5 mg, Oral, BID WC, Adhikari, Amrit, MD .  clopidogrel (PLAVIX) tablet 75 mg, 75 mg, Oral, Daily, Rosalin Hawking, MD, 75 mg at 01/18/18 1110 .  DULoxetine (CYMBALTA) DR capsule 60 mg, 60 mg, Oral, Daily, Dhungel, Nishant, MD, 60 mg at 01/18/18 1109 .  enoxaparin (LOVENOX) injection 40 mg, 40 mg, Subcutaneous, Q24H, Ardis Rowan, PA-C, 40 mg at 01/18/18 1107 .  feeding supplement (ENSURE ENLIVE) (ENSURE ENLIVE) liquid 237 mL, 237 mL, Oral, BID BM, Dhungel, Nishant, MD, 237 mL at 01/17/18 1502 .  folic acid (FOLVITE) tablet 1 mg, 1 mg, Oral, Daily, Smith, Rondell A, MD, 1 mg at 01/18/18 1109 .  furosemide (LASIX) tablet 40 mg, 40 mg, Oral, BID, Adhikari, Amrit, MD .  hydrOXYzine (ATARAX/VISTARIL) tablet 25 mg, 25 mg, Oral, Q6H PRN, Dhungel, Nishant, MD .  insulin aspart (novoLOG) injection 0-15 Units, 0-15 Units, Subcutaneous, TID WC, Dhungel, Nishant, MD, 3 Units at 01/18/18 1310 .  insulin aspart (novoLOG) injection 0-5 Units, 0-5 Units, Subcutaneous, QHS, Dhungel, Nishant, MD .  lactulose  (CHRONULAC) 10 GM/15ML solution 10 g, 10 g, Oral, BID PRN, Dhungel, Nishant, MD .  levETIRAcetam (KEPPRA) tablet 500 mg, 500 mg, Oral, BID, Smith, Rondell A, MD, 500 mg at 01/18/18 1109 .  memantine (NAMENDA) tablet 10 mg, 10 mg, Oral, BID, Dhungel, Nishant, MD, 10 mg at 01/18/18 1130 .  mometasone-formoterol (DULERA) 200-5 MCG/ACT inhaler 2 puff, 2 puff, Inhalation, BID, Dhungel, Nishant, MD, 2 puff at 01/18/18 0838 .  multivitamin with minerals tablet 1 tablet, 1 tablet, Oral, Daily, Tamala Julian, Rondell A, MD, 1 tablet at 01/18/18 1111 .  naphazoline-pheniramine (NAPHCON-A) 0.025-0.3 % ophthalmic solution 1 drop, 1 drop, Left Eye, QID PRN, Dhungel, Nishant, MD .  nicotine (NICODERM CQ - dosed in mg/24 hours) patch 21 mg, 21 mg, Transdermal, Daily, Dhungel, Nishant,  MD, 21 mg at 01/18/18 1116 .  pantoprazole (PROTONIX) EC tablet 40 mg, 40 mg, Oral, Daily, Dhungel, Nishant, MD, 40 mg at 01/18/18 1111 .  pregabalin (LYRICA) capsule 150 mg, 150 mg, Oral, BID, Dhungel, Nishant, MD, 150 mg at 01/18/18 1110 .  rOPINIRole (REQUIP) tablet 0.5 mg, 0.5 mg, Oral, QHS, Dhungel, Nishant, MD, 0.5 mg at 01/17/18 2251 .  senna-docusate (Senokot-S) tablet 1 tablet, 1 tablet, Oral, QHS PRN, Tamala Julian, Rondell A, MD .  thiamine (VITAMIN B-1) tablet 100 mg, 100 mg, Oral, Daily, 100 mg at 01/17/18 1107 **OR** thiamine (B-1) injection 100 mg, 100 mg, Intravenous, Daily, Smith, Rondell A, MD, 100 mg at 01/18/18 1108  Patients Current Diet:  Diet Order            Diet - low sodium heart healthy        Diet Carb Modified Fluid consistency: Thin; Room service appropriate? Yes  Diet effective now              Precautions / Restrictions Precautions Precautions: Fall Precaution Comments: reports at times multiple falls in one day Restrictions Weight Bearing Restrictions: No   Has the patient had 2 or more falls or a fall with injury in the past year?Yes  Prior Activity Level Limited Community (1-2x/wk): has PCS aide  that asissts with transport to appointments and adls  Home Assistive Devices / [place] Devices/Equipment: Nebulizer, Radio producer (specify quad or straight), Wheelchair, CBG Meter, Grab bars in shower, Shower chair with back, [employer] Equipment: [employer] - single point, Civil engineer, contracting, Environmental consultant - 4 wheels, Grab bars - tub/shower, Bedside commode, Wheelchair - power  Prior Device Use: Indicate devices/aids used by the patient prior to current illness, exacerbation or injury? Manual wheelchair, Motorized wheelchair or scooter and cane  Prior Functional Level Prior Function Level of Independence: Needs assistance Gait / Transfers Assistance Needed: walks with cane  ADL's / Homemaking Assistance Needed: needs assist; aide helps with bath, clothing, medications, meals and takes to appointments Communication / Swallowing Assistance Needed: [facility]  Self Care: Did the patient need help bathing, dressing, using the toilet or eating?  Needed some help  Indoor Mobility: Did the patient need assistance with walking from room to room (with or without device)? Independent  Stairs: Did the patient need assistance with internal or external stairs (with or without device)? Needed some help  Functional Cognition: Did the patient need help planning regular tasks such as shopping or remembering to take medications? Needed some help  Current Functional Level Cognition  Arousal/Alertness: Awake/alert Overall Cognitive Status: Impaired/Different from baseline Orientation Level: Oriented X4 Following Commands: Follows one step commands consistently, Follows one step commands with increased time Safety/Judgement: Decreased awareness of safety, Decreased awareness of deficits General Comments: Tactile cues helpful for navigating L UE & LE, pt responded well to demonstration of what his L LE was doing while amb (ER) and what we wanted him to do. Attention: Focused Focused Attention: Appears  intact Alternating Attention: Impaired Alternating Attention Impairment: Verbal complex Memory Impairment: Retrieval deficit, Decreased recall of new information Awareness: Appears intact Problem Solving: Appears intact Executive Function: Organizing Organizing: Impaired Organizing Impairment: Verbal complex Safety/Judgment: Appears intact    Extremity Assessment (includes Sensation/Coordination)  Upper Extremity Assessment: LUE deficits/detail LUE Deficits / Details: Pt without active movement in LUE and noted increased time with ROM. Reporting numbness and tingling in his left hand. Pt stating "it feels like a brick." Pt reports was able to lift some and able to  make a light fist prior to this episode LUE Sensation: decreased light touch LUE Coordination: decreased fine motor, decreased gross motor  Lower Extremity Assessment: Defer to PT evaluation LLE Deficits / Details: AAROM WFL, strength hip flexion 3-/5, note extensor tone, but able to isolate movement in ankle, could not flex knee without assist LLE Sensation: decreased light touch    ADLs  Overall ADL's : Needs assistance/impaired Eating/Feeding: Minimal assistance, Sitting Eating/Feeding Details (indicate cue type and reason): Min A for bilateral tasks Grooming: Maximal assistance, Standing Grooming Details (indicate cue type and reason): Max A for standing balance. Min A for bialteral tasks while seated. Upper Body Bathing: Moderate assistance, Sitting Lower Body Bathing: Maximal assistance, Moderate assistance, Sit to/from stand Lower Body Bathing Details (indicate cue type and reason): Mod-Max A for dynamic standing balance Upper Body Dressing : Moderate assistance, Sitting Lower Body Dressing: Moderate assistance, Maximal assistance, Sitting/lateral leans Lower Body Dressing Details (indicate cue type and reason): Pt donning socks while seated at EOB with Min A for initiating socks over toes. Toilet Transfer:  Ambulation, RW, Maximal assistance Toilet Transfer Details (indicate cue type and reason): Mod-Max A for standing balance with pt with significant lean to left. Cues for bringing left foot forward. Max A for RW management. (simulated to recliner) Functional mobility during ADLs: Moderate assistance, Maximal assistance, Rolling walker(feel would perform better with cane) General ADL Comments: Pt motivated to participate. Presenting with poor functional use of LUE, balance, strength, and activity tolerance.     Mobility  Overal bed mobility: Needs Assistance Bed Mobility: Supine to Sit Supine to sit: HOB elevated, Min guard General bed mobility comments: Pt able to get to EOB without assist, used R UE to pull up on bedrail, increased time needed    Transfers  Overall transfer level: Needs assistance Equipment used: None Transfers: Sit to/from Stand Sit to Stand: Mod assist, Min assist, +2 physical assistance Stand pivot transfers: Min assist General transfer comment: First sit to stand pt performed with min assist +2 for power to stand and blocking of L LE, second sit to stand pt required min A for navigating LEs to prepare to stand and modA for initiation and power to stand. Sit to stand at [facility] required max A+2 for safety. Lastly, stand to sit to recliner, pt had mild LOB and descended quickly into chair.     Ambulation / Gait / Stairs / Wheelchair Mobility  Ambulation/Gait Ambulation/Gait assistance: Min assist, +2 safety/equipment Gait Distance (Feet): 180 Feet Assistive device: Straight cane Gait Pattern/deviations: Decreased dorsiflexion - left, Step-through pattern, Decreased step length - left General Gait Details: Pt amb 180 feet with one sitting rest break at halfway. First half of amb, pt had notable ER in L LE so foot was pointing to L side, pt unable to correct with VCs as he could not see his foot or feel what was happening. PT demonstrated what he was doing & how to correct at  rest break, pt maintained neutral femoral rotation/foot pointing forwards for majority of amb back to room. Pt needed to walk slower to maintain neutral femoral rotation. Gait velocity: decreased Gait velocity interpretation: <1.8 ft/sec, indicate of risk for recurrent falls    Posture / Balance Balance Overall balance assessment: Needs assistance Sitting-balance support: Single extremity supported, Feet supported Sitting balance-Leahy Scale: Fair Standing balance support: During functional activity, Single extremity supported Standing balance-Leahy Scale: Poor Standing balance comment: Pt requires support of RUE and min A during standing for stability.  Special needs/care consideration BiPAP/CPAP n/a CPM n/a Continuous Drip IV n/a Dialysis n/a Life Vest n/a Oxygen n/a Special Bed n/a Trach Size n/a Wound Vac n/a Skin excoriated areas to right knee, left leg and buttocks bilaterally Bowel mgmt: continent LBM 12/9 Bladder mgmt: continent Diabetic mgmt Hgb A1c 5.4   Previous Home Environment Living Arrangements: Alone  Lives With: Alone Available Help at Discharge: (PCS aide 9 am until 12 noon monday throough Friday) Type of Home: [place]: One level Home Access: Building control surveyor Shower/Tub: Chiropodist: Handicapped height Bathroom Accessibility: Yes How Accessible: Accessible via walker [place]: Yes Type of Home Care Services: [place] (if known): [facility];  Additional Comments: had a manual w/c but got lost in ALF in [place] and they lost the power cord for electric w/c  Discharge Living Setting Plans for Discharge Living Setting: Apartment, Alone(Hall Writer living low income housing) Type of Home at Discharge: Apartment Discharge Home Layout: One level Discharge Home Access: Elevator Discharge Bathroom Shower/Tub: Tub/shower unit Discharge Bathroom Toilet: Handicapped  height Discharge Bathroom Accessibility: Yes How Accessible: Accessible via walker Does the patient have any problems obtaining your medications?: No  Social/Family/Support Systems Contact Information: [name] his listed NOK and PCS aide Anticipated Caregiver: PCS aide intermittently Anticipated Caregiver's Contact Information: see above Ability/Limitations of Caregiver: intermittent assist  Caregiver Availability: Intermittent Discharge Plan Discussed with Primary Caregiver: Yes Is Caregiver In Agreement with Plan?: Yes Does Caregiver/Family have Issues with Lodging/Transportation while Pt is in Rehab?: No  Goals/Additional Needs Patient/Family Goal for Rehab: Mod I short distances PT, Mod I to supervision OT, Mod I SLP Expected length of stay: [facility] 10 to 14 days Pt/Family Agrees to Admission and willing to participate: Yes Program Orientation Provided & Reviewed with Pt/Caregiver Including Roles  & Responsibilities: Yes  Barriers to Discharge: Decreased caregiver support, Lack of/limited family support  Decrease burden of Care through IP rehab admission:  N/a  Possible need for SNF placement upon discharge: not anticipated  Patient Condition: This patient's medical and functional status has changed since the consult dated: 01/14/2018 in which the Rehabilitation Physician determined and documented that the patient's condition is appropriate for intensive rehabilitative care in an inpatient rehabilitation facility. See "History of Present Illness" (above) for medical update. Functional changes are: overall mod assist. Patient's medical and functional status update has been discussed with the Rehabilitation physician and patient remains appropriate for inpatient rehabilitation. Will admit to inpatient rehab today.  Preadmission Screen Completed By:  Cleatrice Burke, 01/18/2018 1:22 PM ______________________________________________________________________   Discussed status  with Dr. Naaman Plummer on 01/18/2018 at 1331 and received telephone approval for admission today.  Admission Coordinator:  Cleatrice Burke, time [id number] Date 01/18/2018

## 2018-01-18 NOTE — Progress Notes (Signed)
[name], MD  Physician  Physical Medicine and Rehabilitation  PMR Pre-admission  Addendum  Date of Service:  01/18/2018 1:21 PM       Related encounter: ED to Hosp-Admission (Discharged) from 01/12/2018 in [facility]         Show:Clear all [x] Manual[x] Template[x] Copied  Added by: [x] Cristina Gong, RN  [] Hover for details PMR Admission Coordinator Pre-Admission Assessment  Patient: [name] is an 67 y.o., male MRN: [id number] DOB: 1950-09-30 Height: 6' (182.9 cm) Weight: 98.9 kg                                                                                                                                                  Insurance Information HMO: yes   PPO:      PCP:      IPA:      80/20:      OTHER: medicare advantage plan PRIMARY: [place] [employer]      Policy#: [id number]      Subscriber: pt CM Name: Lenna Sciara      Phone#: [phone]     Fax#: [phone] Pre-Cert#: [id number] approved for 7 days with f/u Dorthula Nettles      Employer: disabled Benefits:  Phone #: [phone]     Name: 01/17/2018 Eff. Date: 01/09/2018     Deduct: none      Out of Pocket Max: [phone]      Life Max: none CIR: $500 co pay per admit/Has Medicaid      SNF: no co pay days 1 until 20; $170.50 co pay per day days 21 until 100 Outpatient: no co pay per visit     Co-Pay: visits per medical neccesity Home Health: 100%      Co-Pay: visits per medical neccesity DME: 80%     Co-Pay: 20% Providers: in network  SECONDARY: Medicaid of [place]      Policy#: [id number]      Subscriber: pt  Medicaid Application Date:       Case Manager:  Disability Application Date:       Case Worker:   Emergency Contact Information         Contact Information    Name Relation Home Work Mobile   Chili Friend   [phone]    Friend is also pt's PCS aide  Current Medical History  Patient Admitting Diagnosis: right frontal infarcts  History of Present  Illness: [name] is a 67 year old right-handed male with history of diabetes mellitus,seizure disorder maintained on Keppra CAD, polysubstance and alcohol abuse, diastolic congestive heart failure, COPD, chronically occluded right ICA, recent CVA with left-sided residual weakness maintained on aspirin and Plavix and was discharged to [facility] facility for 3 weeks before returning home. Per chart review patient lives at [facility] senior apartments with assistance from a home  health aide. He was using a walker while at home with noted falls.Presented 01/13/2018 with increasing weakness 2-3 days.UDS positive cocaine. MRI showed multifocal acute small right frontal MCA territory infarctions. Old right MCA territory infarcts. Cerebral angiogram again confirmed occluded right ICA stent without evidence of string sign and no plan for further intervention. Echocardiogram with ejection fraction of 55-60% without emboli.Chest x-ray with incidental finding of right upper lobe nodule plan follow-up outpatient. Neurology follow-up currently maintained on aspirin and Plavix for CVA prophylaxis. Subcutaneous Lovenox for DVT prophylaxis. Tolerating a regular consistency diet.  Complete NIHSS TOTAL: 4  Past Medical History      Past Medical History:  Diagnosis Date  . Alcohol abuse    H/O  . Anxiety   . Arthritis   . Asthma   . CAD (coronary artery disease) 05/27/10   Cath: severe single vessell CAD left cx midportion obtuse marginal 2 to 3.  [name] CHF (congestive heart failure) ([place])   . COPD (chronic obstructive pulmonary disease) ([place])   . COPD (chronic obstructive pulmonary disease) ([place])   . Depression   . Diabetes mellitus, type 2 ([place]) 03/13/2015  . GERD (gastroesophageal reflux disease)   . HCAP (healthcare-associated pneumonia) 10/15/2015  . History of DVT of lower extremity   . Hyperlipemia   . Hyperlipidemia   . Hypertension   . Myocardial  infarction ([place]) 2000  . Orbital fracture 12/2012  . OSA on CPAP   . Peripheral vascular disease, unspecified ([place])    08/20/10 doppler: increase in right ABI post-op. Left ABI stable. S/P bi-fem bypass surgery  . Pneumonia 04/04/2012  . Shortness of breath   . Stroke ([place])   . Tobacco abuse     Family History  family history includes Cancer in his brother; Cirrhosis in his father; Heart attack in his mother; Heart failure in his brother and mother.  Prior Rehab/Hospitalizations:  Has the patient had major surgery during 100 days prior to admission? No   Recent admit to [facility] he calls ALF  Current Medications   Current Facility-Administered Medications:  .  acetaminophen (TYLENOL) tablet 650 mg, 650 mg, Oral, Q4H PRN **OR** acetaminophen (TYLENOL) solution 650 mg, 650 mg, Per Tube, Q4H PRN **OR** acetaminophen (TYLENOL) suppository 650 mg, 650 mg, Rectal, Q4H PRN, Smith, Rondell A, MD .  albuterol (PROVENTIL) (2.5 MG/3ML) 0.083% nebulizer solution 2.5 mg, 2.5 mg, Inhalation, Q6H PRN, Dhungel, Nishant, MD .  allopurinol (ZYLOPRIM) tablet 300 mg, 300 mg, Oral, Daily, Dhungel, Nishant, MD, 300 mg at 01/18/18 1108 .  aspirin suppository 300 mg, 300 mg, Rectal, Daily **OR** aspirin tablet 325 mg, 325 mg, Oral, Daily, Smith, Rondell A, MD, 325 mg at 01/18/18 1109 .  atorvastatin (LIPITOR) tablet 80 mg, 80 mg, Oral, q1800, Dhungel, Nishant, MD, 80 mg at 01/17/18 1826 .  carvedilol (COREG) tablet 12.5 mg, 12.5 mg, Oral, BID WC, Adhikari, Amrit, MD .  clopidogrel (PLAVIX) tablet 75 mg, 75 mg, Oral, Daily, Rosalin Hawking, MD, 75 mg at 01/18/18 1110 .  DULoxetine (CYMBALTA) DR capsule 60 mg, 60 mg, Oral, Daily, Dhungel, Nishant, MD, 60 mg at 01/18/18 1109 .  enoxaparin (LOVENOX) injection 40 mg, 40 mg, Subcutaneous, Q24H, Ardis Rowan, PA-C, 40 mg at 01/18/18 1107 .  feeding supplement (ENSURE ENLIVE) (ENSURE ENLIVE) liquid 237 mL, 237 mL, Oral,  BID BM, Dhungel, Nishant, MD, 237 mL at 01/17/18 1502 .  folic acid (FOLVITE) tablet 1 mg, 1 mg, Oral, Daily, Smith, Rondell A,  MD, 1 mg at 01/18/18 1109 .  furosemide (LASIX) tablet 40 mg, 40 mg, Oral, BID, Adhikari, Amrit, MD .  hydrOXYzine (ATARAX/VISTARIL) tablet 25 mg, 25 mg, Oral, Q6H PRN, Dhungel, Nishant, MD .  insulin aspart (novoLOG) injection 0-15 Units, 0-15 Units, Subcutaneous, TID WC, Dhungel, Nishant, MD, 3 Units at 01/18/18 1310 .  insulin aspart (novoLOG) injection 0-5 Units, 0-5 Units, Subcutaneous, QHS, Dhungel, Nishant, MD .  lactulose (CHRONULAC) 10 GM/15ML solution 10 g, 10 g, Oral, BID PRN, Dhungel, Nishant, MD .  levETIRAcetam (KEPPRA) tablet 500 mg, 500 mg, Oral, BID, Smith, Rondell A, MD, 500 mg at 01/18/18 1109 .  memantine (NAMENDA) tablet 10 mg, 10 mg, Oral, BID, Dhungel, Nishant, MD, 10 mg at 01/18/18 1130 .  mometasone-formoterol (DULERA) 200-5 MCG/ACT inhaler 2 puff, 2 puff, Inhalation, BID, Dhungel, Nishant, MD, 2 puff at 01/18/18 0838 .  multivitamin with minerals tablet 1 tablet, 1 tablet, Oral, Daily, Tamala Julian, Rondell A, MD, 1 tablet at 01/18/18 1111 .  naphazoline-pheniramine (NAPHCON-A) 0.025-0.3 % ophthalmic solution 1 drop, 1 drop, Left Eye, QID PRN, Dhungel, Nishant, MD .  nicotine (NICODERM CQ - dosed in mg/24 hours) patch 21 mg, 21 mg, Transdermal, Daily, Dhungel, Nishant, MD, 21 mg at 01/18/18 1116 .  pantoprazole (PROTONIX) EC tablet 40 mg, 40 mg, Oral, Daily, Dhungel, Nishant, MD, 40 mg at 01/18/18 1111 .  pregabalin (LYRICA) capsule 150 mg, 150 mg, Oral, BID, Dhungel, Nishant, MD, 150 mg at 01/18/18 1110 .  rOPINIRole (REQUIP) tablet 0.5 mg, 0.5 mg, Oral, QHS, Dhungel, Nishant, MD, 0.5 mg at 01/17/18 2251 .  senna-docusate (Senokot-S) tablet 1 tablet, 1 tablet, Oral, QHS PRN, Tamala Julian, Rondell A, MD .  thiamine (VITAMIN B-1) tablet 100 mg, 100 mg, Oral, Daily, 100 mg at 01/17/18 1107 **OR** thiamine (B-1) injection 100 mg, 100 mg, Intravenous, Daily, Smith,  Rondell A, MD, 100 mg at 01/18/18 1108  Patients Current Diet:     Diet Order                  Diet - low sodium heart healthy         Diet Carb Modified Fluid consistency: Thin; Room service appropriate? Yes  Diet effective now               Precautions / Restrictions Precautions Precautions: Fall Precaution Comments: reports at times multiple falls in one day Restrictions Weight Bearing Restrictions: No   Has the patient had 2 or more falls or a fall with injury in the past year?Yes  Prior Activity Level Limited Community (1-2x/wk): has PCS aide that asissts with transport to appointments and adls  Home Assistive Devices / [place] Devices/Equipment: Nebulizer, Radio producer (specify quad or straight), Wheelchair, CBG Meter, Grab bars in shower, Shower chair with back, [employer] Equipment: [employer] - single point, Civil engineer, contracting, Environmental consultant - 4 wheels, Grab bars - tub/shower, Bedside commode, Wheelchair - power  Prior Device Use: Indicate devices/aids used by the patient prior to current illness, exacerbation or injury? Manual wheelchair, Motorized wheelchair or scooter and cane  Prior Functional Level Prior Function Level of Independence: Needs assistance Gait / Transfers Assistance Needed: walks with cane  ADL's / Homemaking Assistance Needed: needs assist; aide helps with bath, clothing, medications, meals and takes to appointments Communication / Swallowing Assistance Needed: [facility]  Self Care: Did the patient need help bathing, dressing, using the toilet or eating?  Needed some help  Indoor Mobility: Did the patient need assistance with walking from room to  room (with or without device)? Independent  Stairs: Did the patient need assistance with internal or external stairs (with or without device)? Needed some help  Functional Cognition: Did the patient need help planning regular tasks such as shopping or remembering to take medications? Needed  some help  Current Functional Level Cognition  Arousal/Alertness: Awake/alert Overall Cognitive Status: Impaired/Different from baseline Orientation Level: Oriented X4 Following Commands: Follows one step commands consistently, Follows one step commands with increased time Safety/Judgement: Decreased awareness of safety, Decreased awareness of deficits General Comments: Tactile cues helpful for navigating L UE & LE, pt responded well to demonstration of what his L LE was doing while amb (ER) and what we wanted him to do. Attention: Focused Focused Attention: Appears intact Alternating Attention: Impaired Alternating Attention Impairment: Verbal complex Memory Impairment: Retrieval deficit, Decreased recall of new information Awareness: Appears intact Problem Solving: Appears intact Executive Function: Organizing Organizing: Impaired Organizing Impairment: Verbal complex Safety/Judgment: Appears intact    Extremity Assessment (includes Sensation/Coordination)  Upper Extremity Assessment: LUE deficits/detail LUE Deficits / Details: Pt without active movement in LUE and noted increased time with ROM. Reporting numbness and tingling in his left hand. Pt stating "it feels like a brick." Pt reports was able to lift some and able to make a light fist prior to this episode LUE Sensation: decreased light touch LUE Coordination: decreased fine motor, decreased gross motor  Lower Extremity Assessment: Defer to PT evaluation LLE Deficits / Details: AAROM WFL, strength hip flexion 3-/5, note extensor tone, but able to isolate movement in ankle, could not flex knee without assist LLE Sensation: decreased light touch    ADLs  Overall ADL's : Needs assistance/impaired Eating/Feeding: Minimal assistance, Sitting Eating/Feeding Details (indicate cue type and reason): Min A for bilateral tasks Grooming: Maximal assistance, Standing Grooming Details (indicate cue type and reason): Max A for  standing balance. Min A for bialteral tasks while seated. Upper Body Bathing: Moderate assistance, Sitting Lower Body Bathing: Maximal assistance, Moderate assistance, Sit to/from stand Lower Body Bathing Details (indicate cue type and reason): Mod-Max A for dynamic standing balance Upper Body Dressing : Moderate assistance, Sitting Lower Body Dressing: Moderate assistance, Maximal assistance, Sitting/lateral leans Lower Body Dressing Details (indicate cue type and reason): Pt donning socks while seated at EOB with Min A for initiating socks over toes. Toilet Transfer: Ambulation, RW, Maximal assistance Toilet Transfer Details (indicate cue type and reason): Mod-Max A for standing balance with pt with significant lean to left. Cues for bringing left foot forward. Max A for RW management. (simulated to recliner) Functional mobility during ADLs: Moderate assistance, Maximal assistance, Rolling walker(feel would perform better with cane) General ADL Comments: Pt motivated to participate. Presenting with poor functional use of LUE, balance, strength, and activity tolerance.     Mobility  Overal bed mobility: Needs Assistance Bed Mobility: Supine to Sit Supine to sit: HOB elevated, Min guard General bed mobility comments: Pt able to get to EOB without assist, used R UE to pull up on bedrail, increased time needed    Transfers  Overall transfer level: Needs assistance Equipment used: None Transfers: Sit to/from Stand Sit to Stand: Mod assist, Min assist, +2 physical assistance Stand pivot transfers: Min assist General transfer comment: First sit to stand pt performed with min assist +2 for power to stand and blocking of L LE, second sit to stand pt required min A for navigating LEs to prepare to stand and modA for initiation and power to stand. Sit to stand at  BSC required max A+2 for safety. Lastly, stand to sit to recliner, pt had mild LOB and descended quickly into chair.     Ambulation /  Gait / Stairs / Wheelchair Mobility  Ambulation/Gait Ambulation/Gait assistance: Min assist, +2 safety/equipment Gait Distance (Feet): 180 Feet Assistive device: Straight cane Gait Pattern/deviations: Decreased dorsiflexion - left, Step-through pattern, Decreased step length - left General Gait Details: Pt amb 180 feet with one sitting rest break at halfway. First half of amb, pt had notable ER in L LE so foot was pointing to L side, pt unable to correct with VCs as he could not see his foot or feel what was happening. PT demonstrated what he was doing & how to correct at rest break, pt maintained neutral femoral rotation/foot pointing forwards for majority of amb back to room. Pt needed to walk slower to maintain neutral femoral rotation. Gait velocity: decreased Gait velocity interpretation: <1.8 ft/sec, indicate of risk for recurrent falls    Posture / Balance Balance Overall balance assessment: Needs assistance Sitting-balance support: Single extremity supported, Feet supported Sitting balance-Leahy Scale: Fair Standing balance support: During functional activity, Single extremity supported Standing balance-Leahy Scale: Poor Standing balance comment: Pt requires support of RUE and min A during standing for stability.      Special needs/care consideration BiPAP/CPAP n/a CPM n/a Continuous Drip IV n/a Dialysis n/a Life Vest n/a Oxygen n/a Special Bed n/a Trach Size n/a Wound Vac n/a Skin excoriated areas to right knee, left leg and buttocks bilaterally Bowel mgmt: continent LBM 12/9 Bladder mgmt: continent Diabetic mgmt Hgb A1c 5.4   Previous Home Environment Living Arrangements: Alone  Lives With: Alone Available Help at Discharge: (PCS aide 9 am until 12 noon monday throough Friday) Type of Home: [place]: One level Home Access: Building control surveyor Shower/Tub: Chiropodist: Handicapped height Bathroom Accessibility: Yes How Accessible:  Accessible via walker [place]: Yes Type of Home Care Services: [name] (if known): [facility]; [place] Additional Comments: had a manual w/c but got lost in ALF in [place] and they lost the power cord for electric w/c  Discharge Living Setting Plans for Discharge Living Setting: Apartment, Alone(Hall Writer living low income housing) Type of Home at Discharge: Apartment Discharge Home Layout: One level Discharge Home Access: Elevator Discharge Bathroom Shower/Tub: Tub/shower unit Discharge Bathroom Toilet: Handicapped height Discharge Bathroom Accessibility: Yes How Accessible: Accessible via walker Does the patient have any problems obtaining your medications?: No  Social/Family/Support Systems Contact Information: [name] his listed NOK and PCS aide Anticipated Caregiver: PCS aide intermittently Anticipated Caregiver's Contact Information: see above Ability/Limitations of Caregiver: intermittent assist  Caregiver Availability: Intermittent Discharge Plan Discussed with Primary Caregiver: Yes Is Caregiver In Agreement with Plan?: Yes Does Caregiver/Family have Issues with Lodging/Transportation while Pt is in Rehab?: No  Goals/Additional Needs Patient/Family Goal for Rehab: Mod I short distances PT, Mod I to supervision OT, Mod I SLP Expected length of stay: ELOS 10 to 14 days Pt/Family Agrees to Admission and willing to participate: Yes Program Orientation Provided & Reviewed with Pt/Caregiver Including Roles  & Responsibilities: Yes  Barriers to Discharge: Decreased caregiver support, Lack of/limited family support  Decrease burden of Care through IP rehab admission:  N/a  Possible need for SNF placement upon discharge: not anticipated  Patient Condition: This patient's medical and functional status has changed since the consult dated: 01/14/2018 in which the Rehabilitation Physician determined and documented that the  patient's condition is  appropriate for intensive rehabilitative care in an inpatient rehabilitation facility. See "History of Present Illness" (above) for medical update. Functional changes are: overall mod assist. Patient's medical and functional status update has been discussed with the Rehabilitation physician and patient remains appropriate for inpatient rehabilitation. Will admit to inpatient rehab today.  Preadmission Screen Completed By:  Cleatrice Burke, 01/18/2018 1:22 PM ______________________________________________________________________   Discussed status with Dr. Naaman Plummer on 01/18/2018 at 1331 and received telephone approval for admission today.  Admission Coordinator:  Cleatrice Burke, time 4332 Date 01/18/2018       Revision History

## 2018-01-18 NOTE — Progress Notes (Signed)
Pt alert and oriented. Pt arrived to 4W25 with all belongings from 3W. Vitals are within normal limits and patient complains of no pain. Continue plan of care.

## 2018-01-18 NOTE — H&P (Signed)
Physical Medicine and Rehabilitation Admission H&P        Chief Complaint  Patient presents with  . Weakness  . Fall  : HPI: [name] is a 67 year old right-handed male with history of diabetes mellitus,seizure disorder maintained on Keppra CAD, polysubstance and alcohol abuse, diastolic congestive heart failure, COPD, chronically occluded right ICA, recent CVA with left-sided residual weakness maintained on aspirin and Plavix and was discharged to [place] facility for 3 weeks before returning home. Per chart review patient lives at [facility] senior apartments with assistance from a home health aide. He was using a walker while at home with noted falls.Presented 01/13/2018 with increasing weakness 2-3 days.UDS positive cocaine. MRI showed multifocal acute small right frontal MCA territory infarctions. Old right MCA territory infarcts. Cerebral angiogram again confirmed occluded right ICA stent without evidence of string sign and no plan for further intervention. Echocardiogram with ejection fraction of 55-60% without emboli.Chest x-ray with incidental finding of right upper lobe nodule plan follow-up outpatient. Neurology follow-up currently maintained on aspirin and Plavix for CVA prophylaxis. Subcutaneous Lovenox for DVT prophylaxis. Tolerating a regular consistency diet. Therapy evaluations completed with recommendations of physical medicine rehabilitation consult. Patient was admitted for a compressive rehabilitation program.   Review of Systems  Constitutional: Negative for chills and fever.  HENT: Negative for hearing loss.   Eyes: Negative for blurred vision and double vision.  Respiratory: Positive for shortness of breath.   Cardiovascular: Positive for leg swelling. Negative for chest pain.  Gastrointestinal: Positive for constipation. Negative for nausea and vomiting.       GERD  Genitourinary: Positive for urgency. Negative for dysuria,  flank pain and hematuria.  Musculoskeletal: Positive for falls.  Skin: Negative for rash.  Neurological: Positive for seizures.  Psychiatric/Behavioral: Positive for depression.       Anxiety  All other systems reviewed and are negative.       Past Medical History:  Diagnosis Date  . Alcohol abuse      H/O  . Anxiety    . Arthritis    . Asthma    . CAD (coronary artery disease) 05/27/10    Cath: severe single vessell CAD left cx midportion obtuse marginal 2 to 3.  [name] CHF (congestive heart failure) ([place])    . COPD (chronic obstructive pulmonary disease) ([place])    . COPD (chronic obstructive pulmonary disease) ([place])    . Depression    . Diabetes mellitus, type 2 ([place]) 03/13/2015  . GERD (gastroesophageal reflux disease)    . HCAP (healthcare-associated pneumonia) 10/15/2015  . History of DVT of lower extremity    . Hyperlipemia    . Hyperlipidemia    . Hypertension    . Myocardial infarction ([place]) 2000  . Orbital fracture 12/2012  . OSA on CPAP    . Peripheral vascular disease, unspecified ([place])      08/20/10 doppler: increase in right ABI post-op. Left ABI stable. S/P bi-fem bypass surgery  . Pneumonia 04/04/2012  . Shortness of breath    . Stroke ([place])    . Tobacco abuse           Past Surgical History:  Procedure Laterality Date  . abdoninal ao angio & bifem angio   05/27/10    Patent graft, occluded bil stents with no retrograde flow into the hypogastric arteries. 100% occl left ant. tibial artery, 70% to 80% to 100% stenosis right superficial fem artery above adductor canal. 100 %  occl right ant. tibial vessell  . Aortogram w/ PTCA   09/292003  . BACK SURGERY      . CARDIAC CATHETERIZATION   05/27/10    severe CAD left cx  . CHOLECYSTECTOMY      . ESOPHAGOGASTRIC FUNDOPLICATION      . EYE SURGERY      . FEMORAL BYPASS   08/19/10    Right Fem-Pop  . IR ANGIO INTRA EXTRACRAN SEL COM CAROTID INNOMINATE BILAT MOD SED   07/20/2016  . IR ANGIO INTRA EXTRACRAN SEL COM CAROTID  INNOMINATE BILAT MOD SED   01/14/2018  . IR ANGIO VERTEBRAL SEL SUBCLAVIAN INNOMINATE UNI R MOD SED   07/20/2016  . IR ANGIO VERTEBRAL SEL VERTEBRAL BILAT MOD SED   01/14/2018  . IR ANGIO VERTEBRAL SEL VERTEBRAL UNI L MOD SED   07/20/2016  . IR RADIOLOGIST EVAL & MGMT   05/27/2016  . IR US GUIDE VASC ACCESS RIGHT   01/14/2018  . JOINT REPLACEMENT      . PERIPHERAL VASCULAR CATHETERIZATION N/A 12/10/2014    Procedure: Abdominal Aortogram;  Surgeon: Angelia Mould, MD;  Location: [place] [facility];  Service: Cardiovascular;  Laterality: N/A;  . RADIOLOGY WITH ANESTHESIA N/A 02/08/2015    Procedure: RADIOLOGY WITH ANESTHESIA;  Surgeon: Luanne Bras, MD;  Location: [place];  Service: Radiology;  Laterality: N/A;  . TEE WITHOUT CARDIOVERSION N/A 08/29/2014    Procedure: TRANSESOPHAGEAL ECHOCARDIOGRAM (TEE);  Surgeon: Josue Hector, MD;  Location[facility] ENDOSCOPY;  Service: Cardiovascular;  Laterality: N/A;  . TOTAL KNEE ARTHROPLASTY             Family History  Problem Relation Age of Onset  . Heart attack Mother    . Heart failure Mother    . Cirrhosis Father    . Heart failure Brother    . Cancer Brother    . Hypertension Neg Hx          UNKNOWN  . Stroke Neg Hx          UNKNOWN    Social History:  reports that he has been smoking cigars and cigarettes. He started smoking about 56 years ago. He has a 25.00 pack-year smoking history. He has quit using smokeless tobacco.  His smokeless tobacco use included chew. He reports that he drinks alcohol. He reports that he has current or past drug history. Drug: Cocaine. Allergies:       Allergies  Allergen Reactions  . Darvocet [Propoxyphene N-Acetaminophen] Hives  . Haldol [Haloperidol Decanoate] Hives  . Acetaminophen Nausea Only      Upset stomach, tolerates Hydrocodone/APAP if taken with food  . Metformin And Related Nausea And Vomiting  . Norco [Hydrocodone-Acetaminophen] Nausea And Vomiting      Tolerates if taken with food           Medications Prior to Admission  Medication Sig Dispense Refill  . ACCU-CHEK SOFTCLIX LANCETS lancets Use 3 times daily before meals 100 each 5  . albuterol (PROAIR HFA) 108 (90 Base) MCG/ACT inhaler INHALE 2 PUFFS EVERY 6 HOURS AS NEEDED FOR WHEEZING OR SHORTNESS OF BREATH (Patient taking differently: Inhale 2 puffs into the lungs every 6 (six) hours as needed for wheezing or shortness of breath. ) 8.5 g 2  . allopurinol (ZYLOPRIM) 300 MG tablet Take 1 tablet (300 mg total) by mouth daily. 30 tablet 3  . aspirin 325 MG tablet Take 1 tablet (325 mg total) by mouth daily. (Patient taking differently: Take 15  mg by mouth daily. )      . atorvastatin (LIPITOR) 80 MG tablet Take 1 tablet (80 mg total) by mouth daily at 6 PM. 30 tablet 3  . Blood Glucose Monitoring Suppl (ACCU-CHEK AVIVA) device Use 3 times daily before meals. 1 each 0  . carvedilol (COREG) 12.5 MG tablet Take 1 tablet (12.5 mg total) by mouth 2 (two) times daily with a meal. 60 tablet 3  . clopidogrel (PLAVIX) 75 MG tablet Take 1 tablet (75 mg total) by mouth daily. 30 tablet 3  . DULoxetine (CYMBALTA) 60 MG capsule Take 1 capsule (60 mg total) by mouth daily. 30 capsule 3  . Fluticasone-Salmeterol (ADVAIR DISKUS) 250-50 MCG/DOSE AEPB Inhale 1 puff into the lungs 2 (two) times daily. 1 each 3  . furosemide (LASIX) 20 MG tablet Take 2 tablets (40 mg total) by mouth 2 (two) times daily. 360 tablet 0  . glucose blood (ACCU-CHEK AVIVA) test strip Use 3 times daily before meals as directed. 100 each 12  . hydrocortisone cream 1 % Apply 1 application topically 2 (two) times daily as needed for itching.       . hydrOXYzine (ATARAX/VISTARIL) 25 MG tablet Take 25 mg by mouth every 6 (six) hours as needed for anxiety or itching.       . lactulose (CHRONULAC) 10 GM/15ML solution Take 15 mLs (10 g total) by mouth 2 (two) times daily as needed for mild constipation. 236 mL 1  . Lancet Devices (ACCU-CHEK SOFTCLIX) lancets Use 3 times daily before  meals 1 each 5  . levETIRAcetam (KEPPRA) 500 MG tablet Take 1 tablet (500 mg total) by mouth 2 (two) times daily. 60 tablet 3  . memantine (NAMENDA) 10 MG tablet Take 1 tablet (10 mg total) by mouth 2 (two) times daily. 60 tablet 3  . naphazoline-pheniramine (NAPHCON-A) 0.025-0.3 % ophthalmic solution Place 1 drop into the left eye 4 (four) times daily as needed for eye irritation.   0  . pantoprazole (PROTONIX) 40 MG tablet Take 1 tablet (40 mg total) by mouth daily. 30 tablet 3  . pregabalin (LYRICA) 150 MG capsule Take 1 capsule (150 mg total) by mouth 2 (two) times daily. 180 capsule 0  . rOPINIRole (REQUIP) 0.5 MG tablet Take 1 tablet (0.5 mg total) by mouth at bedtime. 30 tablet 3  . tiZANidine (ZANAFLEX) 4 MG tablet Take 1 tablet (4 mg total) by mouth every 8 (eight) hours as needed for muscle spasms. 90 tablet 3  . traZODone (DESYREL) 150 MG tablet Take 1 tablet (150 mg total) by mouth at bedtime as needed for sleep. (Patient not taking: Reported on 11/28/2017) 30 tablet 3      Drug Regimen Review Drug regimen was reviewed and remains appropriate with no significant issues identified   Home: Home Living Family/patient expects to be discharged to:: Private residence Living Arrangements: Alone Available Help at Discharge: Personal care attendant Type of Home: Apartment Home Access: Elevator Home Layout: One level Bathroom Shower/Tub: Chiropodist: Handicapped height Home Equipment: Radio producer - single point, Civil engineer, contracting, Environmental consultant - 4 wheels, Grab bars - tub/shower, Bedside commode, Wheelchair - power Additional Comments: had a manual w/c but got lost in ALF in [place] and they lost the power cord for electric w/c  Lives With: Alone   Functional History: Prior Function Level of Independence: Needs assistance Gait / Transfers Assistance Needed: walks with cane  ADL's / Homemaking Assistance Needed: needs assist; aide helps with bath, clothing, medications, meals  and  takes to appointments Communication / Swallowing Assistance Needed: HOH   Functional Status:  Mobility: Bed Mobility Overal bed mobility: Needs Assistance Bed Mobility: Supine to Sit Supine to sit: Min assist General bed mobility comments: Min A for trunk elevation. Pt able to assist with use of bed rail with RUE.  Transfers Overall transfer level: Needs assistance Equipment used: None Transfers: Sit to/from Stand Sit to Stand: Min assist, Mod assist, +2 physical assistance Stand pivot transfers: Min assist General transfer comment: Pt completes sit to stand 3 times. Pt initially requires min A for the first two sit to stands for power initiation, with mod A x2 on the last for power initiation and stability. Consistent guarding on the LLE, with not instances of buckling during transitional movements.  Ambulation/Gait Ambulation/Gait assistance: Min assist, +2 safety/equipment Gait Distance (Feet): 130 Feet(65x2) Assistive device: Straight cane Gait Pattern/deviations: Decreased step length - left, Decreased dorsiflexion - left, Decreased weight shift to left, Wide base of support General Gait Details: Pt initially able to ambulate around EOB to sitting in the chair. Pt was then able to ambulate ~130' with a rest break in a chair halfway through. Pt demonstrates decreased foot clearance with the LLE,  and relies on the cane in his R hand for balance. Min A given for stability throughout, with one instance of a misstep, with no loss of balance noted. Two person needed for chair follow and safety  Gait velocity interpretation: <1.8 ft/sec, indicate of risk for recurrent falls   ADL: ADL Overall ADL's : Needs assistance/impaired Eating/Feeding: Minimal assistance, Sitting Eating/Feeding Details (indicate cue type and reason): Min A for bilateral tasks Grooming: Maximal assistance, Standing Grooming Details (indicate cue type and reason): Max A for standing balance. Min A for bialteral  tasks while seated. Upper Body Bathing: Moderate assistance, Sitting Lower Body Bathing: Maximal assistance, Moderate assistance, Sit to/from stand Lower Body Bathing Details (indicate cue type and reason): Mod-Max A for dynamic standing balance Upper Body Dressing : Moderate assistance, Sitting Lower Body Dressing: Moderate assistance, Maximal assistance, Sitting/lateral leans Lower Body Dressing Details (indicate cue type and reason): Pt donning socks while seated at EOB with Min A for initiating socks over toes. Toilet Transfer: Ambulation, RW, Maximal assistance Toilet Transfer Details (indicate cue type and reason): Mod-Max A for standing balance with pt with significant lean to left. Cues for bringing left foot forward. Max A for RW management. (simulated to recliner) Functional mobility during ADLs: Moderate assistance, Maximal assistance, Rolling walker(feel would perform better with cane) General ADL Comments: Pt motivated to participate. Presenting with poor functional use of LUE, balance, strength, and activity tolerance.    Cognition: Cognition Overall Cognitive Status: Impaired/Different from baseline Arousal/Alertness: Awake/alert Orientation Level: Oriented X4 Attention: Focused Focused Attention: Appears intact Alternating Attention: Impaired Alternating Attention Impairment: Verbal complex Memory Impairment: Retrieval deficit, Decreased recall of new information Awareness: Appears intact Problem Solving: Appears intact Executive Function: Writer: Impaired Organizing Impairment: Verbal complex Safety/Judgment: Appears intact Cognition Arousal/Alertness: Awake/alert Behavior During Therapy: WFL for tasks assessed/performed Overall Cognitive Status: Impaired/Different from baseline Area of Impairment: Following commands, Safety/judgement, Awareness, Problem solving Following Commands: Follows one step commands consistently, Follows one step commands with  increased time Safety/Judgement: Decreased awareness of safety, Decreased awareness of deficits Awareness: Emergent Problem Solving: Slow processing, Requires verbal cues, Decreased initiation General Comments: Pt requires multimodal cueing throughout. Pt is very motivated to participate in PT.    Physical Exam: Blood pressure (!) 168/76, pulse 75, temperature 98  F (36.7 C), resp. rate 17, height 6' (1.829 m), weight 98.9 kg, SpO2 95 %. Physical Exam  Neurological:  Patient is alert. Speech is dysarthric but intelligible. Follows commands. Reasonable insight and awareness.  He provides his name and age as well as date of birth. Left central 7. LUE 2 to 2+/5 prox to distal. LLE 2 to 2+/5 prox to distal. DTR's 1+. Senses light touch and PP in all 4.   Skin: Skin is warm.  A few scattered ecchymoses  Psychiatric: He has a normal mood and affect. His behavior is normal.      Lab Results Last 48 Hours  Results for orders placed or performed during the hospital encounter of 01/12/18 (from the past 48 hour(s))  Glucose, capillary     Status: Abnormal    Collection Time: 01/16/18 11:42 AM  Result Value Ref Range    Glucose-Capillary 124 (H) 70 - 99 mg/dL  Glucose, capillary     Status: Abnormal    Collection Time: 01/16/18  4:30 PM  Result Value Ref Range    Glucose-Capillary 100 (H) 70 - 99 mg/dL  Glucose, capillary     Status: Abnormal    Collection Time: 01/16/18 10:03 PM  Result Value Ref Range    Glucose-Capillary 107 (H) 70 - 99 mg/dL  Glucose, capillary     Status: Abnormal    Collection Time: 01/17/18  7:25 AM  Result Value Ref Range    Glucose-Capillary 101 (H) 70 - 99 mg/dL    Comment 1 Notify RN      Comment 2 Document in Chart    Glucose, capillary     Status: Abnormal    Collection Time: 01/17/18 12:48 PM  Result Value Ref Range    Glucose-Capillary 136 (H) 70 - 99 mg/dL  Glucose, capillary     Status: Abnormal    Collection Time: 01/17/18  4:48 PM  Result Value Ref  Range    Glucose-Capillary 116 (H) 70 - 99 mg/dL    Comment 1 Notify RN      Comment 2 Document in Chart    Glucose, capillary     Status: Abnormal    Collection Time: 01/17/18  9:25 PM  Result Value Ref Range    Glucose-Capillary 114 (H) 70 - 99 mg/dL    Comment 1 Notify RN      Comment 2 Document in Chart    Glucose, capillary     Status: None    Collection Time: 01/18/18  6:16 AM  Result Value Ref Range    Glucose-Capillary 96 70 - 99 mg/dL    Comment 1 Notify RN      Comment 2 Document in Chart        Imaging Results (Last 48 hours)  No results found.           Medical Problem List and Plan: 1.   Left hemiparesis secondary to  Right MCA small embolic infarcts due to chronic right ICA occlusion in the setting of cocaine abuse and suspect medical noncompliance             -admit to inpatient rehab 2.  DVT Prophylaxis/Anticoagulation: Subcutaneous Lovenox. Monitor for any bleeding episodes 3. Pain Management: Lyrica 150 mg twice a day 4. Mood:  Namenda 10 mg twice a day,Cymbalta 60 mg daily,  Atarax as needed for anxiety 5. Neuropsych: This patient is capable of making decisions on his own behalf.  6. Skin/Wound Care:  Routine skin checks 7. Fluids/Electrolytes/Nutrition:  Routine and out's with follow-up chemistries upon admit. 8. Seizure disorder. Keppra 500 mg twice a day 9. History of polysubstance abuse / abuse tobacco abuse. UDS positive for cocaine. Provide counseling. Continue NicoDerm patch 10. Diabetes mellitus with peripheral neuropathy.Hemoglobin A1c 5.4. SSI. Check blood sugars before meals and at bedtime 11. Diastolic congestive heart failure. Monitor for any signs of fluid overload             -check daily weights 12.  Hyperlipidemia. Lipitor 13. History of gout. Zyloprim 300 mg daily 14. Constipation. Laxative assistance   Post Admission Physician Evaluation: 1. Functional deficits secondary  to right MCA embolic infarct. 2. Patient is admitted to receive  collaborative, interdisciplinary care between the physiatrist, rehab nursing staff, and therapy team. 3. Patient's level of medical complexity and substantial therapy needs in context of that medical necessity cannot be provided at a lesser intensity of care such as a SNF. 4. Patient has experienced substantial functional loss from his/her baseline which was documented above under the "Functional History" and "Functional Status" headings.  Judging by the patient's diagnosis, physical exam, and functional history, the patient has potential for functional progress which will result in measurable gains while on inpatient rehab.  These gains will be of substantial and practical use upon discharge  in facilitating mobility and self-care at the household level. 5. Physiatrist will provide 24 hour management of medical needs as well as oversight of the therapy plan/treatment and provide guidance as appropriate regarding the interaction of the two. 6. The Preadmission Screening has been reviewed and patient status is unchanged unless otherwise stated above. 7. 24 hour rehab nursing will assist with bladder management, bowel management, safety, skin/wound care, disease management, medication administration, pain management and patient education  and help integrate therapy concepts, techniques,education, etc. 8. PT will assess and treat for/with: Lower extremity strength, range of motion, stamina, balance, functional mobility, safety, adaptive techniques and equipment, NMR, orthotics, community reentry.   Goals are: mod I. 9. OT will assess and treat for/with:  ADL's, functional mobility, safety, upper extremity strength, adaptive techniques and equipment, NMR, family ed, community reentryGoals are: mod I to supervision. Therapy may proceed with showering this patient. 10. SLP will assess and treat for/with: speech, communication, community reintegration  Goals are: mod I. 11. Case Management and Social Worker will  assess and treat for psychological issues and discharge planning. 12. Team conference will be held weekly to assess progress toward goals and to determine barriers to discharge. 13. Patient will receive at least 3 hours of therapy per day at least 5 days per week. 14. ELOS: 10-14 days      15. Prognosis:  excellent     I have personally performed a face to face diagnostic evaluation of this patient and formulated the key components of the plan.  Additionally, I have personally reviewed laboratory data, imaging studies, as well as relevant notes and concur with the physician assistant's documentation above.   [name], MD, [facility] Drown     Lavon Paganini [place], PA-C 01/18/2018   The patient's status has not changed. The original post admission physician evaluation remains appropriate, and any changes from the pre-admission screening or documentation from the acute chart are noted above.  [name], MD 01/18/2018

## 2018-01-18 NOTE — Progress Notes (Signed)
[name], MD    [name], MD  Physician  Physical Medicine and Rehabilitation      Consult Note  Signed     Date of Service:  01/14/2018 10:45 AM         Related encounter: ED to Hosp-Admission (Discharged) from 01/12/2018 in [facility]             Signed          Expand All Collapse All            Expand widget buttonCollapse widget button    Show:Clear all   ManualTemplateCopied  Added by:     Cathlyn Parsons, PA-C  [name], MD   Hover for detailscustomization button                                                                                                                                                                         untitled image              Physical Medicine and Rehabilitation Consult  Reason for Consult:  Left Side weakness  Referring Physician: [facility]        HPI: [name] is a 67 y.o.right handed male with history of diabetes mellitus, CAD, alcohol abuse, diastolic congestive heart failure, COPD,chronically occluded right ICA, recent CVA with left-sided residual weakness and discharged to [facility] facility for 3 weeks before returning home. Per chart review patient lives at [facility] senior apartments with assistance from a home health aide. He was using a walker while at home was noted multiple falls.Patient does not have any local family. Presented 01/13/2018 with increasing weakness 2-3 days. MRI shows multifocal acute small right frontal MCA territory infarcts. Old right MCA territory infarct.cerebral angiogram pending. Echocardiogram pending. Patient presently remains on aspirin and Plavix for CVA prophylaxis.  Subcutaneous Lovenox for DVT prophylaxisTherapy evaluation completed with recommendations of physical medicine rehabilitation consult.        Review of Systems   Constitutional: Negative for chills and fever.   HENT: Negative for hearing loss.    Eyes: Negative for blurred vision and double vision.   Respiratory: Positive for shortness of breath.    Cardiovascular: Positive for leg swelling. Negative for chest pain and palpitations.   Gastrointestinal: Positive for constipation. Negative for nausea.        GERD   Genitourinary: Positive for urgency. Negative for dysuria, flank pain and hematuria.   Musculoskeletal: Positive for falls and myalgias.   Skin: Negative for rash.   Psychiatric/Behavioral: Positive for depression.  Anxiety   All other systems reviewed and are negative.          Past Medical History:    Diagnosis   Date    .   Alcohol abuse            H/O    .   Anxiety        .   Arthritis        .   Asthma        .   CAD (coronary artery disease)   05/27/10        Cath: severe single vessell CAD left cx midportion obtuse marginal 2 to 3.    [name]   CHF (congestive heart failure) ([place])        .   COPD (chronic obstructive pulmonary disease) ([place])        .   COPD (chronic obstructive pulmonary disease) ([place])        .   Depression        .   Diabetes mellitus, type 2 ([place])   03/13/2015    .   GERD (gastroesophageal reflux disease)        .   HCAP (healthcare-associated pneumonia)   10/15/2015    .   History of DVT of lower extremity        .   Hyperlipemia        .   Hyperlipidemia        .   Hypertension        .   Myocardial infarction ([place])   2000    .   Orbital fracture   12/2012    .   OSA on CPAP        .   Peripheral vascular disease, unspecified ([place])            08/20/10 doppler: increase in right ABI  post-op. Left ABI stable. S/P bi-fem bypass surgery    .   Pneumonia   04/04/2012    .   Shortness of breath        .   Stroke ([place])        .   Tobacco abuse                 Past Surgical History:    Procedure   Laterality   Date    .   abdoninal ao angio & bifem angio       05/27/10        Patent graft, occluded bil stents with no retrograde flow into the hypogastric arteries. 100% occl left ant. tibial artery, 70% to 80% to 100% stenosis right superficial fem artery above adductor canal. 100 % occl right ant. tibial vessell    .   Aortogram w/ PTCA       09/292003    .   BACK SURGERY            .   CARDIAC CATHETERIZATION       05/27/10        severe CAD left cx    .   CHOLECYSTECTOMY            .   ESOPHAGOGASTRIC FUNDOPLICATION            .   EYE SURGERY            .   FEMORAL BYPASS       08/19/10        Right  Fem-Pop    .   IR ANGIO INTRA EXTRACRAN SEL COM CAROTID INNOMINATE BILAT MOD SED       07/20/2016    .   IR ANGIO VERTEBRAL SEL SUBCLAVIAN INNOMINATE UNI R MOD SED       07/20/2016    .   IR ANGIO VERTEBRAL SEL VERTEBRAL UNI L MOD SED       07/20/2016    .   IR RADIOLOGIST EVAL & MGMT       05/27/2016    .   JOINT REPLACEMENT            .   PERIPHERAL VASCULAR CATHETERIZATION   N/A   12/10/2014        Procedure: Abdominal Aortogram;  Surgeon: Angelia Mould, MD;  Location: [facility];  Service: Cardiovascular;  Laterality: N/A;    .   RADIOLOGY WITH ANESTHESIA   N/A   02/08/2015        Procedure: RADIOLOGY WITH ANESTHESIA;  Surgeon: Luanne Bras, MD;  Location: [facility];  Service: Radiology;  Laterality: N/A;    .   TEE WITHOUT CARDIOVERSION   N/A   08/29/2014        Procedure: TRANSESOPHAGEAL ECHOCARDIOGRAM (TEE);  Surgeon: Josue Hector, MD;  Location: [facility];  Service:  Cardiovascular;  Laterality: N/A;    .   TOTAL KNEE ARTHROPLASTY                     Family History    Problem   Relation   Age of Onset    .   Heart attack   Mother        .   Heart failure   Mother        .   Cirrhosis   Father        .   Heart failure   Brother        .   Cancer   Brother        .   Hypertension   Neg Hx                UNKNOWN    .   Stroke   Neg Hx                UNKNOWN       Social History:  reports that he has been smoking cigars and cigarettes. He started smoking about 56 years ago. He has a 25.00 pack-year smoking history. He has quit using smokeless tobacco.  His smokeless tobacco use included chew. He reports that he drinks alcohol. He reports that he has current or past drug history. Drug: Cocaine.  Allergies:         Allergies    Allergen   Reactions    .   Darvocet [Propoxyphene N-Acetaminophen]   Hives    .   Haldol [Haloperidol Decanoate]   Hives    .   Acetaminophen   Nausea Only            Upset stomach, tolerates Hydrocodone/APAP if taken with food    .   Metformin And Related   Nausea And Vomiting    .   Norco [Hydrocodone-Acetaminophen]   Nausea And Vomiting            Tolerates if taken with food              Medications Prior to Admission    Medication  Sig   Dispense   Refill    .   ACCU-CHEK SOFTCLIX LANCETS lancets   Use 3 times daily before meals   100 each   5    .   albuterol (PROAIR HFA) 108 (90 Base) MCG/ACT inhaler   INHALE 2 PUFFS EVERY 6 HOURS AS NEEDED FOR WHEEZING OR SHORTNESS OF BREATH (Patient taking differently: Inhale 2 puffs into the lungs every 6 (six) hours as needed for wheezing or shortness of breath. )   8.5 g   2    .   allopurinol (ZYLOPRIM) 300 MG tablet   Take 1 tablet (300 mg total) by mouth daily.   30 tablet   3    .   aspirin 325 MG  tablet   Take 1 tablet (325 mg total) by mouth daily. (Patient taking differently: Take 81 mg by mouth daily. )            .   atorvastatin (LIPITOR) 80 MG tablet   Take 1 tablet (80 mg total) by mouth daily at 6 PM.   30 tablet   3    .   Blood Glucose Monitoring Suppl (ACCU-CHEK AVIVA) device   Use 3 times daily before meals.   1 each   0    .   carvedilol (COREG) 12.5 MG tablet   Take 1 tablet (12.5 mg total) by mouth 2 (two) times daily with a meal.   60 tablet   3    .   clopidogrel (PLAVIX) 75 MG tablet   Take 1 tablet (75 mg total) by mouth daily.   30 tablet   3    .   DULoxetine (CYMBALTA) 60 MG capsule   Take 1 capsule (60 mg total) by mouth daily.   30 capsule   3    .   Fluticasone-Salmeterol (ADVAIR DISKUS) 250-50 MCG/DOSE AEPB   Inhale 1 puff into the lungs 2 (two) times daily.   1 each   3    .   furosemide (LASIX) 20 MG tablet   Take 2 tablets (40 mg total) by mouth 2 (two) times daily.   360 tablet   0    .   glucose blood (ACCU-CHEK AVIVA) test strip   Use 3 times daily before meals as directed.   100 each   12    .   hydrocortisone cream 1 %   Apply 1 application topically 2 (two) times daily as needed for itching.             .   hydrOXYzine (ATARAX/VISTARIL) 25 MG tablet   Take 25 mg by mouth every 6 (six) hours as needed for anxiety or itching.             .   lactulose (CHRONULAC) 10 GM/15ML solution   Take 15 mLs (10 g total) by mouth 2 (two) times daily as needed for mild constipation.   236 mL   1    .   Lancet Devices (ACCU-CHEK SOFTCLIX) lancets   Use 3 times daily before meals   1 each   5    .   levETIRAcetam (KEPPRA) 500 MG tablet   Take 1 tablet (500 mg total) by mouth 2 (two) times daily.   60 tablet   3    .   memantine (NAMENDA) 10 MG tablet   Take 1 tablet (10 mg total) by mouth 2 (two) times daily.   60 tablet  3    .    naphazoline-pheniramine (NAPHCON-A) 0.025-0.3 % ophthalmic solution   Place 1 drop into the left eye 4 (four) times daily as needed for eye irritation.       0    .   pantoprazole (PROTONIX) 40 MG tablet   Take 1 tablet (40 mg total) by mouth daily.   30 tablet   3    .   pregabalin (LYRICA) 150 MG capsule   Take 1 capsule (150 mg total) by mouth 2 (two) times daily.   180 capsule   0    .   rOPINIRole (REQUIP) 0.5 MG tablet   Take 1 tablet (0.5 mg total) by mouth at bedtime.   30 tablet   3    .   tiZANidine (ZANAFLEX) 4 MG tablet   Take 1 tablet (4 mg total) by mouth every 8 (eight) hours as needed for muscle spasms.   90 tablet   3    .   traZODone (DESYREL) 150 MG tablet   Take 1 tablet (150 mg total) by mouth at bedtime as needed for sleep. (Patient not taking: Reported on 11/28/2017)   30 tablet   3          Home:  [place] expects to be discharged to:: Private residence  Living Arrangements: Alone  Available Help at Discharge: Personal care attendant(3 hours M-F some times S&S)  Type of Home: Apartment(Hall towers)  Home Access: Elevator  Home Layout: One level  Bathroom Shower/Tub: Administrator, Civil Service: Handicapped height  Home Equipment: Radio producer - single point, Civil engineer, contracting, Environmental consultant - 4 wheels, Grab bars - tub/shower, Bedside commode, Wheelchair - power  Additional Comments: had a manual w/c but got lost in ALF in [place] and they lost the power cord for electric w/c   Functional History:  Prior Function  Level of Independence: Needs assistance  Gait / Transfers Assistance Needed: walks with cane   ADL's / Homemaking Assistance Needed: needs assist; aide helps with bath, clothing, medications, meals and takes to appointments  Communication / Swallowing Assistance Needed: HOH  Functional Status:   Mobility:  Bed Mobility  Overal bed mobility: Needs Assistance  Bed  Mobility: Supine to Sit  Supine to sit: Supervision, HOB elevated  General bed mobility comments: heavy use of bed rail and increased time two attempts for success  Transfers  Overall transfer level: Needs assistance  Equipment used: None, Straight cane  Transfers: Sit to/from Stand, Stand Pivot Transfers  Sit to Stand: Min assist  Stand pivot transfers: Min assist  General transfer comment: assist to stabilize, initially from bed no device, then with cane from recliner  Ambulation/Gait  Ambulation/Gait assistance: Min assist  Gait Distance (Feet): 80 Feet  Assistive device: Straight cane  Gait Pattern/deviations: Step-to pattern, Decreased stride length, Wide base of support  General Gait Details: increased time and very carefully places L foot for balance, and to avoid buckling, increased time for turns, assist needed for balance/safety       ADL:       Cognition:  Cognition  Overall Cognitive Status: No family/caregiver present to determine baseline cognitive functioning  Orientation Level: Oriented X4  Cognition  Arousal/Alertness: Awake/alert  Behavior During Therapy: WFL for tasks assessed/performed  Overall Cognitive Status: No family/caregiver present to determine baseline cognitive functioning     Blood pressure (!) 155/78, pulse 82, temperature 97.7 F (36.5 C), temperature source Oral, resp. rate 20, SpO2 92 %.  Physical Exam   Constitutional: He appears well-developed.   HENT:   Head: Normocephalic.   Eyes: Pupils are equal, round, and reactive to light.   Neck: Normal range of motion.   Cardiovascular: Normal rate.   Respiratory: Effort normal.   GI: Soft.  Musculoskeletal: He exhibits no edema.  Neurological:  Patient is alert and severely dysarthric. Follow simple commands. He was able to provide his name and age. Left sided weakness  Psychiatric: He has a normal mood and affect. His behavior is normal.          Lab Results Last 24 Hours                                                                                                                   Imaging Results (Last 48 hours)                                           Assessment/Plan:  Diagnosis: right frontal MCA infarcts with left hemiparesis  1.Does the need for close, 24 hr/day medical supervision in concert with the patient's rehab needs make it unreasonable for this patient to be served in a less intensive setting? Yes   2.Co-Morbidities requiring supervision/potential complications: CAD, dCHF, COPD   3.Due to bladder management, bowel management, safety, skin/wound care, disease management, medication administration and patient education, does the patient require 24 hr/day rehab nursing? Yes   4.Does the patient require coordinated care of a physician, rehab nurse, PT (1-2 hrs/day, 5 days/week), OT (1-2 hrs/day, 5 days/week) and SLP (1-2 hrs/day, 5 days/week) to address physical and functional deficits in the context of the above medical diagnosis(es)? Yes Addressing deficits in the following areas: balance, endurance, locomotion, strength, transferring, bowel/bladder control, bathing, dressing, feeding, grooming, toileting, cognition, speech, swallowing and psychosocial support   5.Can the patient actively participate in an intensive therapy program of at least 3 hrs of therapy per day at least 5 days per week? Yes   6.The potential for patient to make measurable gains while on inpatient rehab is good   7.Anticipated functional outcomes upon discharge from inpatient rehab are modified independent  with PT, modified independent and supervision with OT, modified independent with SLP.   8.Estimated rehab length of stay to reach the above functional goals is: 9-15 days   9.Anticipated D/C setting:  Home   10.Anticipated post D/C treatments: [place] therapy   11.Overall Rehab/Functional Prognosis: good      RECOMMENDATIONS:  This patient's condition is appropriate for continued rehabilitative care in the following setting: CIR  Patient has agreed to participate in recommended program. Yes  Note that insurance prior authorization may be required for reimbursement for recommended care.     Comment: Rehab Admissions Coordinator to follow up.     Thanks,     [name], MD, FAAPMR     I have personally performed a face to face  diagnostic evaluation of this patient. Additionally, I have reviewed and concur with the physician assistant's documentation above.       Lavon Paganini Angiulli, PA-C  01/14/2018                Revision History                                        Routing History

## 2018-01-18 NOTE — Progress Notes (Signed)
Gave report to [place], Therapist, sports on 4W

## 2018-01-18 NOTE — Care Management Note (Signed)
Case Management Note  Patient Details  Name: [name] MRN: [id number] Date of Birth: 1950/06/13  Subjective/Objective:     Pt admitted with CVA. He is from home with an aide.                Action/Plan: Pt discharging to CIR today. CM signing off.   Expected Discharge Date:  01/18/18               Expected Discharge Plan:  [place]  In-House Referral:     Discharge planning Services  CM Consult  Post Acute Care Choice:    Choice offered to:     DME Arranged:    DME Agency:     HH Arranged:    HH Agency:     Status of Service:  Completed, signed off  If discussed at [employer], dates discussed:    Additional Comments:  Pollie Friar, RN 01/18/2018, 1:32 PM

## 2018-01-19 ENCOUNTER — Inpatient Hospital Stay (HOSPITAL_COMMUNITY): Payer: Self-pay | Admitting: Occupational Therapy

## 2018-01-19 ENCOUNTER — Inpatient Hospital Stay (HOSPITAL_COMMUNITY): Payer: Self-pay | Admitting: Physical Therapy

## 2018-01-19 ENCOUNTER — Inpatient Hospital Stay (HOSPITAL_COMMUNITY): Payer: Self-pay | Admitting: Speech Pathology

## 2018-01-19 DIAGNOSIS — G8114 Spastic hemiplegia affecting left nondominant side: Secondary | ICD-10-CM

## 2018-01-19 DIAGNOSIS — I639 Cerebral infarction, unspecified: Secondary | ICD-10-CM

## 2018-01-19 LAB — CBC WITH DIFFERENTIAL/PLATELET
Abs Immature Granulocytes: 0.04 10*3/uL (ref 0.00–0.07)
BASOS ABS: 0.1 10*3/uL (ref 0.0–0.1)
Basophils Relative: 1 %
Eosinophils Absolute: 0.4 10*3/uL (ref 0.0–0.5)
Eosinophils Relative: 8 %
HCT: 41.3 % (ref 39.0–52.0)
Hemoglobin: 12.2 g/dL — ABNORMAL LOW (ref 13.0–17.0)
Immature Granulocytes: 1 %
LYMPHS ABS: 1.4 10*3/uL (ref 0.7–4.0)
Lymphocytes Relative: 29 %
MCH: 26.8 pg (ref 26.0–34.0)
MCHC: 29.5 g/dL — ABNORMAL LOW (ref 30.0–36.0)
MCV: 90.6 fL (ref 80.0–100.0)
Monocytes Absolute: 0.6 10*3/uL (ref 0.1–1.0)
Monocytes Relative: 12 %
Neutro Abs: 2.3 10*3/uL (ref 1.7–7.7)
Neutrophils Relative %: 49 %
Platelets: 176 10*3/uL (ref 150–400)
RBC: 4.56 MIL/uL (ref 4.22–5.81)
RDW: 14 % (ref 11.5–15.5)
WBC: 4.8 10*3/uL (ref 4.0–10.5)
nRBC: 0 % (ref 0.0–0.2)

## 2018-01-19 LAB — COMPREHENSIVE METABOLIC PANEL
ALT: 30 U/L (ref 0–44)
AST: 37 U/L (ref 15–41)
Albumin: 3.1 g/dL — ABNORMAL LOW (ref 3.5–5.0)
Alkaline Phosphatase: 66 U/L (ref 38–126)
Anion gap: 8 (ref 5–15)
BUN: 18 mg/dL (ref 8–23)
CHLORIDE: 108 mmol/L (ref 98–111)
CO2: 29 mmol/L (ref 22–32)
Calcium: 9.6 mg/dL (ref 8.9–10.3)
Creatinine, Ser: 0.74 mg/dL (ref 0.61–1.24)
GFR calc Af Amer: >60 mL/min (ref >60)
GFR calc non Af Amer: >60 mL/min (ref >60)
Glucose, Bld: 106 mg/dL — ABNORMAL HIGH (ref 70–99)
POTASSIUM: 4.6 mmol/L (ref 3.5–5.1)
SODIUM: 145 mmol/L (ref 135–145)
Total Bilirubin: 0.9 mg/dL (ref 0.3–1.2)
Total Protein: 5.9 g/dL — ABNORMAL LOW (ref 6.5–8.1)

## 2018-01-19 LAB — GLUCOSE, CAPILLARY: Glucose-Capillary: 92 mg/dL (ref 70–99)

## 2018-01-19 MED ORDER — TRIAMCINOLONE ACETONIDE 0.1 % EX CREA
TOPICAL_CREAM | Freq: Two times a day (BID) | CUTANEOUS | Status: DC
  Administered 2018-01-19 – 2018-01-20 (×3): via TOPICAL
  Administered 2018-01-20: 1 via TOPICAL
  Administered 2018-01-21 – 2018-01-22 (×3): via TOPICAL
  Administered 2018-01-22 – 2018-01-23 (×2): 1 via TOPICAL
  Administered 2018-01-23 – 2018-01-27 (×9): via TOPICAL
  Administered 2018-01-28: 1 via TOPICAL
  Administered 2018-01-28 – 2018-02-04 (×13): via TOPICAL
  Filled 2018-01-19 (×2): qty 15

## 2018-01-19 MED ORDER — HYDROCERIN EX CREA
TOPICAL_CREAM | Freq: Two times a day (BID) | CUTANEOUS | Status: DC
  Administered 2018-01-19 – 2018-01-22 (×7): via TOPICAL
  Administered 2018-01-22: 1 via TOPICAL
  Administered 2018-01-23: 09:00:00 via TOPICAL
  Administered 2018-01-23: 1 via TOPICAL
  Administered 2018-01-24 – 2018-01-28 (×9): via TOPICAL
  Administered 2018-01-28: 1 via TOPICAL
  Administered 2018-01-29 – 2018-02-04 (×13): via TOPICAL
  Filled 2018-01-19: qty 113

## 2018-01-19 NOTE — Evaluation (Signed)
Physical Therapy Assessment and Plan  Patient Details  Name: [name] MRN: [id number] Date of Birth: [birth year]  PT Diagnosis: Abnormality of gait, Coordination disorder, Difficulty walking, Hemiparesis non-dominant and Muscle weakness Rehab Potential: Good ELOS: 2 weeks    Today's Date: 01/19/2018 PT Individual Time: 1100-1200 PT Individual Time Calculation (min): 60 min    Problem List:  Patient Active Problem List   Diagnosis Date Noted  . Right middle cerebral artery stroke ([place]) 01/18/2018  . AMS (altered mental status) 11/29/2017  . Acute encephalopathy 11/28/2017  . Left shoulder pain 09/28/2017  . Cerebral thrombosis with cerebral infarction 07/18/2016  . Acute ischemic stroke ([place])   . Left hemiplegia ([place])   . Stroke-like symptoms 07/17/2016  . Insomnia 01/29/2016  . Depression 01/29/2016  . Alcohol abuse with intoxication ([place]) 10/16/2015  . Psoriasiform dermatitis 07/12/2015  . Dementia due to another medical condition ([place]) 07/12/2015  . Urinary incontinence 03/14/2015  . Type 2 diabetes mellitus with peripheral neuropathy ([place]) 03/13/2015  . Hemiparesis affecting left side as late effect of cerebrovascular accident ([place]) 02/18/2015  . Cerebrovascular accident (CVA) due to stenosis of right carotid artery ([place])   . Coronary artery disease involving native coronary artery of native heart without angina pectoris   . Tachypnea   . Thrombocytopenia ([place])   . History of stroke   . Stroke (cerebrum) ([place]) 02/06/2015  . Arterial ischemic stroke, MCA (middle cerebral artery), right, acute ([place])   . DOE (dyspnea on exertion) 11/23/2014  . PAF (paroxysmal atrial fibrillation) ([place]) 11/23/2014  . HLD (hyperlipidemia) 11/01/2014  . Tobacco use disorder 11/01/2014  . Diastolic CHF, acute on chronic (HCC) 11/01/2014  . Cerebral infarction due to stenosis of right carotid artery ([place]) 11/01/2014  . Carotid stenosis 11/01/2014  . Hyperkalemia 10/24/2014  . H/O  total knee replacement 10/24/2014  . Essential hypertension   . Polysubstance abuse ([place]) 08/28/2014  . Cocaine abuse ([place])   . Intracranial carotid stenosis   . Headache around the eyes 08/27/2014  . Acute CVA (cerebrovascular accident) ([place]) 08/27/2014  . CVA (cerebral vascular accident) ([place]) 08/26/2014  . History of DVT (deep vein thrombosis)   . Alcoholism ([place])   . Embolic stroke ([place])   . Cerebral infarction due to embolism of right middle cerebral artery ([place])   . Hypotension   . AKI (acute kidney injury) ([place])   . Stroke ([place])   . CVA (cerebral infarction) 08/01/2014  . Alcohol dependence with withdrawal with complication ([place]) 56/43/3295  . DVT (deep venous thrombosis) ([place]) 02/22/2014  . Lower extremity edema 02/21/2014  . Chronic diastolic congestive heart failure ([place]) 11/21/2013  . CAD (coronary artery disease) 11/21/2013  . Alcohol abuse 11/21/2013  . GERD (gastroesophageal reflux disease) 08/10/2013  . Gout 08/10/2013  . Tobacco abuse 07/26/2013  . Trichiasis without entropion 04/16/2013  . Dizziness 04/10/2013  . Eyelid lesion 01/24/2013  . Enophthalmos due to trauma 01/19/2013  . Medial orbital wall fracture 01/19/2013  . Closed blow-out fracture of floor of orbit ([place]) 01/19/2013  . Binocular vision disorder with diplopia 01/03/2013  . Fracture of inferior orbital wall ([place]) 01/03/2013  . Fracture of orbital floor ([place]) 01/03/2013  . Pain of left eye 01/03/2013  . Peripheral vascular disease ([place]) 07/27/2012  . COPD (chronic obstructive pulmonary disease) ([place]) 04/04/2012  . HTN (hypertension) 04/04/2012    Past Medical History:  Past Medical History:  Diagnosis Date  . Alcohol abuse    H/O  . Anxiety   . Arthritis   .  Asthma   . CAD (coronary artery disease) 05/27/10   Cath: severe single vessell CAD left cx midportion obtuse marginal 2 to 3.  [name] CHF (congestive heart failure) ([place])   . COPD (chronic obstructive pulmonary disease) ([place])   . COPD  (chronic obstructive pulmonary disease) ([place])   . Depression   . Diabetes mellitus, type 2 ([place]) 03/13/2015  . GERD (gastroesophageal reflux disease)   . HCAP (healthcare-associated pneumonia) 10/15/2015  . History of DVT of lower extremity   . Hyperlipemia   . Hyperlipidemia   . Hypertension   . Myocardial infarction ([place]) 2000  . Orbital fracture 12/2012  . OSA on CPAP   . Peripheral vascular disease, unspecified ([place])    08/20/10 doppler: increase in right ABI post-op. Left ABI stable. S/P bi-fem bypass surgery  . Pneumonia 04/04/2012  . Shortness of breath   . Stroke ([place])   . Tobacco abuse    Past Surgical History:  Past Surgical History:  Procedure Laterality Date  . abdoninal ao angio & bifem angio  05/27/10   Patent graft, occluded bil stents with no retrograde flow into the hypogastric arteries. 100% occl left ant. tibial artery, 70% to 80% to 100% stenosis right superficial fem artery above adductor canal. 100 % occl right ant. tibial vessell  . Aortogram w/ PTCA  09/292003  . BACK SURGERY    . CARDIAC CATHETERIZATION  05/27/10   severe CAD left cx  . CHOLECYSTECTOMY    . ESOPHAGOGASTRIC FUNDOPLICATION    . EYE SURGERY    . FEMORAL BYPASS  08/19/10   Right Fem-Pop  . IR ANGIO INTRA EXTRACRAN SEL COM CAROTID INNOMINATE BILAT MOD SED  07/20/2016  . IR ANGIO INTRA EXTRACRAN SEL COM CAROTID INNOMINATE BILAT MOD SED  01/14/2018  . IR ANGIO VERTEBRAL SEL SUBCLAVIAN INNOMINATE UNI R MOD SED  07/20/2016  . IR ANGIO VERTEBRAL SEL VERTEBRAL BILAT MOD SED  01/14/2018  . IR ANGIO VERTEBRAL SEL VERTEBRAL UNI L MOD SED  07/20/2016  . IR RADIOLOGIST EVAL & MGMT  05/27/2016  . IR US GUIDE VASC ACCESS RIGHT  01/14/2018  . JOINT REPLACEMENT    . PERIPHERAL VASCULAR CATHETERIZATION N/A 12/10/2014   Procedure: Abdominal Aortogram;  Surgeon: Angelia Mould, MD;  Location: [facility];  Service: Cardiovascular;  Laterality: N/A;  . RADIOLOGY WITH ANESTHESIA N/A 02/08/2015    Procedure: RADIOLOGY WITH ANESTHESIA;  Surgeon: Luanne Bras, MD;  Location: [facility];  Service: Radiology;  Laterality: N/A;  . TEE WITHOUT CARDIOVERSION N/A 08/29/2014   Procedure: TRANSESOPHAGEAL ECHOCARDIOGRAM (TEE);  Surgeon: Josue Hector, MD;  Location: [facility] ENDOSCOPY;  Service: Cardiovascular;  Laterality: N/A;  . TOTAL KNEE ARTHROPLASTY      Assessment & Plan Clinical Impression: [name] is a 67 year old right-handed male with history of diabetes mellitus,seizure disorder maintained on Keppra CAD, polysubstance and alcohol abuse, diastolic congestive heart failure, COPD, chronically occluded right ICA, recent CVA with left-sided residual weakness maintained on aspirin and Plavix and was discharged to [facility] facility for 3 weeks before returning home. Per chart review patient lives at [facility] with assistance from a home health aide. He was using a walker while at home with noted falls.Presented 01/13/2018 with increasing weakness 2-3 days.UDS positive cocaine. MRI showed multifocal acute small right frontal MCA territory infarctions. Old right MCA territory infarcts. Cerebral angiogram again confirmed occluded right ICA stent without evidence of string sign and no plan for further intervention. Echocardiogram with  ejection fraction of 55-60% without emboli.Chest x-ray with incidental finding of right upper lobe nodule plan follow-up outpatient. Neurology follow-up currently maintained on aspirin and Plavix for CVA prophylaxis. Subcutaneous Lovenox for DVT prophylaxis. Tolerating a regular consistency diet. Therapy evaluations completed with recommendations of physical medicine rehabilitation consult. Patient was admitted for a compressive rehabilitation program.  Patient transferred to CIR on 01/18/2018 .   Patient currently requires Min-Mod assist  with mobility secondary to muscle weakness, decreased cardiorespiratoy endurance,  decreased coordination and decreased standing balance and decreased balance strategies.  Prior to hospitalization, patient was supervision with mobility and lived with Alone in a [facility] home.  Home access is  Elevator.  Patient will benefit from skilled PT intervention to maximize safe functional mobility and minimize fall risk for planned discharge home with 24/7 S versus possible SNF, TBD pending progress .  Anticipate patient will benefit from follow up [facility] or in house-PT at SNF (pending progress and DC location) at discharge.  PT - End of Session Activity Tolerance: Tolerates 30+ min activity with multiple rests Endurance Deficit: Yes Endurance Deficit Description: becomes SOB with exertion PT Assessment Rehab Potential (ACUTE/IP ONLY): Good PT Barriers to Discharge: Other (comments);Lack of/limited family support PT Barriers to Discharge Comments: safety concerns- multiple falls at home when aide is not with him on weekends, no family to assist  PT Patient demonstrates impairments in the following area(s): Balance;Safety;Endurance;Motor PT Transfers Functional Problem(s): Bed Mobility;Bed to Chair;Car;Furniture;Floor PT Locomotion Functional Problem(s): Ambulation;Wheelchair Mobility;Stairs PT Plan PT Intensity: Minimum of 1-2 x/day ,45 to 90 minutes PT Frequency: 5 out of 7 days PT Duration Estimated Length of Stay: 2 weeks  PT Treatment/Interventions: Ambulation/gait training;Community reintegration;DME/adaptive equipment instruction;Neuromuscular re-education;Psychosocial support;Stair training;UE/LE Strength taining/ROM;Wheelchair propulsion/positioning;Balance/vestibular training;Discharge planning;Functional electrical stimulation;Pain management;Skin care/wound management;Therapeutic Activities;UE/LE Coordination activities;Cognitive remediation/compensation;Disease management/prevention;Functional mobility training;Patient/family education;Splinting/orthotics;Therapeutic  Exercise;Visual/perceptual remediation/compensation PT Transfers Anticipated Outcome(s): 24/7 S hemi-walker  PT Locomotion Anticipated Outcome(s): 24/7 S hemi-walker  PT Recommendation Recommendations for Other Services: Neuropsych consult;Vestibular eval;Therapeutic Recreation consult Therapeutic Recreation Interventions: Stress management;Outing/community reintergration Follow Up Recommendations: Skilled nursing facility;24 hour supervision/assistance Patient destination: [facility] (SNF) Equipment Recommended: 3 in 1 bedside comode;Tub/shower seat;Wheelchair (measurements);Wheelchair cushion (measurements);Other (comment)(hemi-walker )  Skilled Therapeutic Intervention Patient received up in [facility] with OT, pleasant and willing to participate in PT session today. Evaluation and plan of care initiated. He was able to perform sit to stand with ModA and able to use urinal at sink without UE support and MinA to maintain balance, then able to self-propel WC approximately 54f with R UE/LE and limited by fatigue. [facility] and attempted gait with R hemi-walker due to L sided weakness and history of multiple falls with SPC in the past, patient able to ambulate approximately 549fwith hemiwalker and MinA for safety. Able to complete car transfer with MinA as well. Attempted stair navigation, however at this point in the evaluation patient reported he was feeling light headed and like he was going to pass out; immediately [facility]: BP 118/73, O2 100% on room air, HR 83 BPM. Symptoms continued sitting down so returned patient to room and alerted RN of condition with further blood pressures as follows:  [facility] After transferring to EOB with MinA 104/62 Supine 130/59  RN present during BP measures in room and aware of BP changes with positional changes as well as overall symptoms. He was left  in bed  in his room with RN present and attending, all needs otherwise met.   PT Evaluation Precautions/Restrictions Precautions Precautions: Fall;Other (comment) Precaution Comments: L hemiparesis UE>LE, positive orthostatics- watch BP  Restrictions Weight Bearing Restrictions: No General Chart Reviewed: Yes Response to Previous Treatment: Patient with no complaints from previous session. Vital SignsTherapy Vitals Temp: 97.8 F (36.6 C) Temp Source: Oral Pulse Rate: 71 Resp: 19 BP: (!) 99/53 Patient Position (if appropriate): Lying Oxygen Therapy SpO2: 98 % O2 Device: Room Air Pain Pain Assessment Pain Scale: 0-10 Pain Score: 0-No pain Pain Type: Acute pain Pain Location: Buttocks Pain Orientation: Right;Left Pain Radiating Towards: Back Pain Descriptors / Indicators: Aching;Dull Pain Frequency: Constant Pain Onset: On-going Patients Stated Pain Goal: 0 Pain Intervention(s): Medication (See eMAR);Repositioned Home Living/Prior Functioning Home Living Available Help at Discharge: Personal care attendant Type of Home: Apartment Home Access: Elevator Home Layout: One level Bathroom Shower/Tub: Chiropodist: Handicapped height Bathroom Accessibility: Yes Additional Comments: had a manual w/c but got lost in ALF in [place] and they lost the power cord for electric w/c  Lives With: Alone Prior Function Level of Independence: Needs assistance with ADLs;Independent with transfers;Requires assistive device for independence;Other (comment)(hx of multiple falls )  Able to Take Stairs?: (able to navigate curb but fell a lot ) Driving: No Vision/Perception  Vision - Assessment Eye Alignment: Impaired (comment)(eyes did not appear to be in exact alignment, needs to be further assessed ) Ocular Range of Motion: Within Functional Limits Alignment/Gaze Preference: Within Defined Limits Tracking/Visual Pursuits: Decreased smoothness of horizontal  tracking;Decreased smoothness of vertical tracking Convergence: Impaired - to be further tested in functional context Additional Comments: possible mild eye disconjugation- needs furhter testing  Perception Perception: Within Functional Limits Praxis Praxis: Intact  Cognition Overall Cognitive Status: Within Functional Limits for tasks assessed Arousal/Alertness: Awake/alert Orientation Level: Oriented X4 Attention: Selective Focused Attention: Appears intact Selective Attention: Appears intact Alternating Attention: Impaired Alternating Attention Impairment: Verbal complex Memory: Impaired Memory Impairment: Decreased short term memory Awareness: Appears intact Problem Solving: Impaired Problem Solving Impairment: Functional complex Safety/Judgment: Appears intact Sensation Sensation Light Touch: Appears Intact Hot/Cold: Appears Intact Proprioception: Appears Intact(in LEs ) Stereognosis: Impaired by gross assessment Coordination Gross Motor Movements are Fluid and Coordinated: No Fine Motor Movements are Fluid and Coordinated: No Coordination and Movement Description: limited by weakness  L UE and L LE  Finger Nose Finger Test: unable to perform with L hand Heel Shin Test: unable to perform with L LE  Motor  Motor Motor: Hemiplegia  Mobility Bed Mobility Bed Mobility: Rolling Right;Rolling Left;Supine to Sit;Sit to Supine Rolling Right: Contact Guard/Touching assist Rolling Left: Contact Guard/Touching assist Supine to Sit: Minimal Assistance - Patient > 75% Sit to Supine: Minimal Assistance - Patient > 75% Transfers Transfers: Sit to Stand;Stand to Sit;Stand Pivot Transfers Sit to Stand: Moderate Assistance - Patient 50-74% Stand to Sit: Minimal Assistance - Patient > 75% Stand Pivot Transfers: Minimal Assistance - Patient > 75% Stand Pivot Transfer Details: Visual cues for safe use of DME/AE;Visual cues/gestures for sequencing;Verbal cues for technique;Verbal cues  for gait pattern;Manual facilitation for weight shifting;Tactile cues for sequencing;Verbal cues for sequencing;Verbal cues for precautions/safety Transfer (Assistive device): Hemi-walker Locomotion  Gait Ambulation: Yes Gait Assistance: Minimal Assistance - Patient > 75% Gait Distance (Feet): 50 Feet Assistive device: Hemi-walker Gait Assistance Details: Visual cues for safe use of DME/AE;Visual cues/gestures for sequencing;Verbal cues for technique;Verbal cues for gait pattern;Visual cues/gestures for precautions/safety;Verbal cues for sequencing;Verbal cues for precautions/safety;Verbal cues for safe  use of DME/AE;Manual facilitation for placement Gait Gait: Yes Gait Pattern: Step-to pattern;Decreased step length - left;Decreased step length - right;Decreased stance time - left;Decreased stride length;Decreased dorsiflexion - left;Decreased weight shift to left;Left foot flat;Decreased trunk rotation;Trunk flexed;Wide base of support Stairs / Additional Locomotion Stairs: No Wheelchair Mobility Wheelchair Mobility: Yes Wheelchair Assistance: Development worker, international aid: Right upper extremity;Right lower extremity Wheelchair Parts Management: Needs assistance Distance: 36f   Trunk/Postural Assessment  Cervical Assessment Cervical Assessment: Within Functional Limits Thoracic Assessment Thoracic Assessment: Within Functional Limits Lumbar Assessment Lumbar Assessment: Within Functional Limits Postural Control Postural Control: Within Functional Limits  Balance Balance Balance Assessed: Yes Static Sitting Balance Static Sitting - Balance Support: Feet supported;Right upper extremity supported Static Sitting - Level of Assistance: 5: Stand by assistance Dynamic Sitting Balance Dynamic Sitting - Balance Support: Feet supported;Right upper extremity supported Dynamic Sitting - Level of Assistance: 5: Stand by assistance Static Standing Balance Static  Standing - Balance Support: Right upper extremity supported;During functional activity Static Standing - Level of Assistance: 4: Min assist Dynamic Standing Balance Dynamic Standing - Balance Support: Right upper extremity supported;During functional activity Dynamic Standing - Level of Assistance: 4: Min assist Extremity Assessment  RUE Assessment RUE Assessment: Within Functional Limits LUE Assessment LUE Assessment: Exceptions to WRockland And Bergen Surgery Center LLCActive Range of Motion (AROM) Comments: 20% AROM throughout UE LUE Body System: Neuro Brunstrum levels for arm and hand: Arm Brunstrum level for arm: Stage II Synergy is developing RLE Assessment RLE Assessment: Within Functional Limits LLE Assessment LLE Assessment: Exceptions to W[facility] LLCGeneral Strength Comments: MMT ankle dorisflexion 3/5, quad 3/5, hip flexor 3-/5, hip abductor 2/5     Refer to Care Plan for Long Term Goals  Recommendations for other services: Neuropsych and Therapeutic Recreation  Stress management and Outing/community reintegration  Discharge Criteria: Patient will be discharged from PT if patient refuses treatment 3 consecutive times without medical reason, if treatment goals not met, if there is a change in medical status, if patient makes no progress towards goals or if patient is discharged from hospital.  The above assessment, treatment plan, treatment alternatives and goals were discussed and mutually agreed upon: by patient  KDeniece ReePT, DPT, CBIS  Supplemental Physical Therapist CTaylor   Pager [phone] Rehab Office [phone]

## 2018-01-19 NOTE — Patient Care Conference (Signed)
Inpatient RehabilitationTeam Conference and Plan of Care Update Date: 01/19/2018   Time: 11:30 AM    Patient Name: [name]      Medical Record Number: [id number]  Date of Birth: May 24, 1950 Sex: Male         Room/Bed: 4W25C/4W25C-01 Payor Info: Payor: Theme park manager MEDICARE / Plan: UHC MEDICARE / Product Type: *No Product type* /    Admitting Diagnosis: cva  Admit Date/Time:  01/18/2018  3:08 PM Admission Comments: No comment available   Primary Diagnosis:  <principal problem not specified> Principal Problem: <principal problem not specified>  Patient Active Problem List   Diagnosis Date Noted  . Right middle cerebral artery stroke ([place]) 01/18/2018  . AMS (altered mental status) 11/29/2017  . Acute encephalopathy 11/28/2017  . Left shoulder pain 09/28/2017  . Cerebral thrombosis with cerebral infarction 07/18/2016  . Acute ischemic stroke ([place])   . Left hemiplegia ([place])   . Stroke-like symptoms 07/17/2016  . Insomnia 01/29/2016  . Depression 01/29/2016  . Alcohol abuse with intoxication ([place]) 10/16/2015  . Psoriasiform dermatitis 07/12/2015  . Dementia due to another medical condition ([place]) 07/12/2015  . Urinary incontinence 03/14/2015  . Type 2 diabetes mellitus with peripheral neuropathy ([place]) 03/13/2015  . Hemiparesis affecting left side as late effect of cerebrovascular accident ([place]) 02/18/2015  . Cerebrovascular accident (CVA) due to stenosis of right carotid artery ([place])   . Coronary artery disease involving native coronary artery of native heart without angina pectoris   . Tachypnea   . Thrombocytopenia ([place])   . History of stroke   . Stroke (cerebrum) ([place]) 02/06/2015  . Arterial ischemic stroke, MCA (middle cerebral artery), right, acute ([place])   . DOE (dyspnea on exertion) 11/23/2014  . PAF (paroxysmal atrial fibrillation) ([place]) 11/23/2014  . HLD (hyperlipidemia) 11/01/2014  . Tobacco use disorder 11/01/2014  . Diastolic CHF, acute on chronic (HCC)  11/01/2014  . Cerebral infarction due to stenosis of right carotid artery ([place]) 11/01/2014  . Carotid stenosis 11/01/2014  . Hyperkalemia 10/24/2014  . H/O total knee replacement 10/24/2014  . Essential hypertension   . Polysubstance abuse ([place]) 08/28/2014  . Cocaine abuse ([place])   . Intracranial carotid stenosis   . Headache around the eyes 08/27/2014  . Acute CVA (cerebrovascular accident) ([place]) 08/27/2014  . CVA (cerebral vascular accident) ([place]) 08/26/2014  . History of DVT (deep vein thrombosis)   . Alcoholism ([place])   . Embolic stroke ([place])   . Cerebral infarction due to embolism of right middle cerebral artery ([place])   . Hypotension   . AKI (acute kidney injury) ([place])   . Stroke ([place])   . CVA (cerebral infarction) 08/01/2014  . Alcohol dependence with withdrawal with complication ([place]) 99/37/1696  . DVT (deep venous thrombosis) ([place]) 02/22/2014  . Lower extremity edema 02/21/2014  . Chronic diastolic congestive heart failure ([place]) 11/21/2013  . CAD (coronary artery disease) 11/21/2013  . Alcohol abuse 11/21/2013  . GERD (gastroesophageal reflux disease) 08/10/2013  . Gout 08/10/2013  . Tobacco abuse 07/26/2013  . Trichiasis without entropion 04/16/2013  . Dizziness 04/10/2013  . Eyelid lesion 01/24/2013  . Enophthalmos due to trauma 01/19/2013  . Medial orbital wall fracture 01/19/2013  . Closed blow-out fracture of floor of orbit ([place]) 01/19/2013  . Binocular vision disorder with diplopia 01/03/2013  . Fracture of inferior orbital wall ([place]) 01/03/2013  . Fracture of orbital floor ([place]) 01/03/2013  . Pain of left eye 01/03/2013  . Peripheral vascular disease ([place]) 07/27/2012  . COPD (chronic  obstructive pulmonary disease) ([place]) 04/04/2012  . HTN (hypertension) 04/04/2012    Expected Discharge Date:    Team Members Present: Physician leading conference: Dr. Alysia Penna Social Worker Present: Ovidio Kin, LCSW Nurse Present: Rayetta Pigg, RN PT Present:  Michaelene Song, PT OT Present: Roanna Epley, COTA SLP Present: Weston Anna, SLP PPS Coordinator present : Daiva Nakayama, RN, CRRN;Melissa Gertie Fey     Current Status/Progress Goal Weekly Team Focus  Medical   Blood pressure fluctuating, substance abuse, cocaine and tobacco him a severe hemapheresis  Reduce fall risk, secondary stroke prevention  Initiate rehabilitation program   Bowel/Bladder   Continent of B/B LBM 12/10  remain continent of B/B normal bowel function  toilet q2h and prn laxatives prn   Swallow/Nutrition/ Hydration             ADL's     eval pending        Mobility   eval pending         Communication   Eval Pending          Safety/Cognition/ Behavioral Observations  Eval Pending          Pain   Pt c/o of back pain tylenol with relief, scheduled lyrica  pt will have pain <+2/10  assess pain qshift and prn medicate notify MD for unrelieved pain   Skin   Scabbed excoriated (from scratching) areas on legs buttucks   pt free of skin breakdown excoriated areas healing without complication no s/sx of infection  assess skin q shift       *See Care Plan and progress notes for long and short-term goals.     Barriers to Discharge  Current Status/Progress Possible Resolutions Date Resolved   Physician    Medical stability;Lack of/limited family support     Initial eval's in progress  Continue rehabilitation       Nursing                  PT                    OT Decreased caregiver support;Lack of/limited family support  history of falls             SLP                SW Decreased caregiver support;Medication compliance Does not have 24 hr care and at times is non-compliant with his medications            Discharge Planning/Teaching Needs:    Plans to go home to independent level apartment with his PCS aide and friend. He has been to a NH and ALF and he has no plans on going back there.     Team Discussion:  New eval MD reports R-MCA left hemiparesis. Weak  on right side also form previous stroke. Being evaluated and goals being set in therapies. 2-3 week LOS Neuro-psych referral made  Revisions to Treatment Plan:  New eval    Continued Need for Acute Rehabilitation Level of Care: The patient requires daily medical management by a physician with specialized training in physical medicine and rehabilitation for the following conditions: Daily direction of a multidisciplinary physical rehabilitation program to ensure safe treatment while eliciting the highest outcome that is of practical value to the patient.: Yes Daily medical management of patient stability for increased activity during participation in an intensive rehabilitation regime.: Yes Daily analysis of laboratory values and/or radiology reports with any subsequent need for  medication adjustment of medical intervention for : Neurological problems   I attest that I was present, lead the team conference, and concur with the assessment and plan of the team.   Elease Hashimoto 01/19/2018, 2:30 PM

## 2018-01-19 NOTE — Progress Notes (Signed)
Nurse called to room during PT session due to a drop in patients blood pressure. Pt became dizzy and light headed during session while standing. Orthostatic vitals were taken with patient back in room. Pt has knee high teds in place. Pt given thigh high teds  Pt put back to bed to rest. Will continue to monitor.

## 2018-01-19 NOTE — Progress Notes (Signed)
Inpatient Rehabilitation  Patient information reviewed and entered into eRehab system by Karan Ramnauth M. Wilberta Dorvil, M.A., CCC/SLP, PPS Coordinator.  Information including medical coding, functional ability and quality indicators will be reviewed and updated through discharge.    Per nursing patient was given "Data Collection Information Summary" for Patients in Inpatient Rehabilitation Facilities with attached "Privacy Act Statement-Health Care Records" upon admission.   

## 2018-01-19 NOTE — Care Management Note (Signed)
Inpatient [facility] Individual Statement of Services  Patient Name:  [name]  Date:  01/19/2018  Welcome to the [facility].  Our goal is to provide you with an individualized program based on your diagnosis and situation, designed to meet your specific needs.  With this comprehensive rehabilitation program, you will be expected to participate in at least 3 hours of rehabilitation therapies Monday-Friday, with modified therapy programming on the weekends.  Your rehabilitation program will include the following services:  Physical Therapy (PT), Occupational Therapy (OT), Speech Therapy (ST), 24 hour per day rehabilitation nursing, Neuropsychology, Case Management (Social Worker), Rehabilitation Medicine, Nutrition Services and Pharmacy Services  Weekly team conferences will be held on Wednesday to discuss your progress.  Your Social Worker will talk with you frequently to get your input and to update you on team discussions.  Team conferences with you and your family in attendance may also be held.  Expected length of stay: 12-14 days  Overall anticipated outcome: supervision with cueing  Depending on your progress and recovery, your program may change. Your Social Worker will coordinate services and will keep you informed of any changes. Your Social Worker's name and contact numbers are listed  below.  The following services may also be recommended but are not provided by the [facility]:   [facility] will be made to provide these services after discharge if needed.  Arrangements include referral to agencies that provide these services.  Your insurance has been verified to be:  UHC-Medicare & Medicaid Your primary doctor is:  Charlott Rakes  Pertinent information will be shared with your doctor and your insurance company.  Social Worker:  Ovidio Kin,  [place] or ([phone]  Information discussed with and copy given to patient by: Elease Hashimoto, 01/19/2018, 8:50 AM

## 2018-01-19 NOTE — Evaluation (Signed)
Speech Language Pathology Assessment and Plan  Patient Details  Name: [name] MRN: [id number] Date of Birth: Jun 24, 1950  SLP Diagnosis: Dysarthria;Cognitive Impairments  Rehab Potential: Excellent ELOS: 12-14 days     Today's Date: 01/19/2018 SLP Individual Time: 1400-1455 SLP Individual Time Calculation (min): 55 min   Problem List:  Patient Active Problem List   Diagnosis Date Noted  . Right middle cerebral artery stroke ([place]) 01/18/2018  . AMS (altered mental status) 11/29/2017  . Acute encephalopathy 11/28/2017  . Left shoulder pain 09/28/2017  . Cerebral thrombosis with cerebral infarction 07/18/2016  . Acute ischemic stroke ([place])   . Left hemiplegia ([place])   . Stroke-like symptoms 07/17/2016  . Insomnia 01/29/2016  . Depression 01/29/2016  . Alcohol abuse with intoxication ([place]) 10/16/2015  . Psoriasiform dermatitis 07/12/2015  . Dementia due to another medical condition ([place]) 07/12/2015  . Urinary incontinence 03/14/2015  . Type 2 diabetes mellitus with peripheral neuropathy ([employer] [place]) 03/13/2015  . Hemiparesis affecting left side as late effect of cerebrovascular accident ([place]) 02/18/2015  . Cerebrovascular accident (CVA) due to stenosis of right carotid artery ([place])   . Coronary artery disease involving native coronary artery of native heart without angina pectoris   . Tachypnea   . Thrombocytopenia ([place])   . History of stroke   . Stroke (cerebrum) ([place]) 02/06/2015  . Arterial ischemic stroke, MCA (middle cerebral artery), right, acute ([place])   . DOE (dyspnea on exertion) 11/23/2014  . PAF (paroxysmal atrial fibrillation) ([place]) 11/23/2014  . HLD (hyperlipidemia) 11/01/2014  . Tobacco use disorder 11/01/2014  . Diastolic CHF, acute on chronic (HCC) 11/01/2014  . Cerebral infarction due to stenosis of right carotid artery ([place]) 11/01/2014  . Carotid stenosis 11/01/2014  . Hyperkalemia 10/24/2014  . H/O total knee replacement 10/24/2014  . Essential  hypertension   . Polysubstance abuse ([place]) 08/28/2014  . Cocaine abuse ([place])   . Intracranial carotid stenosis   . Headache around the eyes 08/27/2014  . Acute CVA (cerebrovascular accident) ([place]) 08/27/2014  . CVA (cerebral vascular accident) ([place]) 08/26/2014  . History of DVT (deep vein thrombosis)   . Alcoholism ([place])   . Embolic stroke ([place])   . Cerebral infarction due to embolism of right middle cerebral artery ([place])   . Hypotension   . AKI (acute kidney injury) ([place])   . Stroke ([place])   . CVA (cerebral infarction) 08/01/2014  . Alcohol dependence with withdrawal with complication ([place]) 76/19/5093  . DVT (deep venous thrombosis) ([place]) 02/22/2014  . Lower extremity edema 02/21/2014  . Chronic diastolic congestive heart failure ([place]) 11/21/2013  . CAD (coronary artery disease) 11/21/2013  . Alcohol abuse 11/21/2013  . GERD (gastroesophageal reflux disease) 08/10/2013  . Gout 08/10/2013  . Tobacco abuse 07/26/2013  . Trichiasis without entropion 04/16/2013  . Dizziness 04/10/2013  . Eyelid lesion 01/24/2013  . Enophthalmos due to trauma 01/19/2013  . Medial orbital wall fracture 01/19/2013  . Closed blow-out fracture of floor of orbit ([place]) 01/19/2013  . Binocular vision disorder with diplopia 01/03/2013  . Fracture of inferior orbital wall ([place]) 01/03/2013  . Fracture of orbital floor ([place]) 01/03/2013  . Pain of left eye 01/03/2013  . Peripheral vascular disease ([place]) 07/27/2012  . COPD (chronic obstructive pulmonary disease) ([place]) 04/04/2012  . HTN (hypertension) 04/04/2012   Past Medical History:  Past Medical History:  Diagnosis Date  . Alcohol abuse    H/O  . Anxiety   . Arthritis   . Asthma   . CAD (coronary  artery disease) 05/27/10   Cath: severe single vessell CAD left cx midportion obtuse marginal 2 to 3.  [name] CHF (congestive heart failure) ([place])   . COPD (chronic obstructive pulmonary disease) ([place])   . COPD (chronic obstructive pulmonary disease) ([place])   .  Depression   . Diabetes mellitus, type 2 ([place]) 03/13/2015  . GERD (gastroesophageal reflux disease)   . HCAP (healthcare-associated pneumonia) 10/15/2015  . History of DVT of lower extremity   . Hyperlipemia   . Hyperlipidemia   . Hypertension   . Myocardial infarction ([place]) 2000  . Orbital fracture 12/2012  . OSA on CPAP   . Peripheral vascular disease, unspecified ([place])    08/20/10 doppler: increase in right ABI post-op. Left ABI stable. S/P bi-fem bypass surgery  . Pneumonia 04/04/2012  . Shortness of breath   . Stroke ([place])   . Tobacco abuse    Past Surgical History:  Past Surgical History:  Procedure Laterality Date  . abdoninal ao angio & bifem angio  05/27/10   Patent graft, occluded bil stents with no retrograde flow into the hypogastric arteries. 100% occl left ant. tibial artery, 70% to 80% to 100% stenosis right superficial fem artery above adductor canal. 100 % occl right ant. tibial vessell  . Aortogram w/ PTCA  09/292003  . BACK SURGERY    . CARDIAC CATHETERIZATION  05/27/10   severe CAD left cx  . CHOLECYSTECTOMY    . ESOPHAGOGASTRIC FUNDOPLICATION    . EYE SURGERY    . FEMORAL BYPASS  08/19/10   Right Fem-Pop  . IR ANGIO INTRA EXTRACRAN SEL COM CAROTID INNOMINATE BILAT MOD SED  07/20/2016  . IR ANGIO INTRA EXTRACRAN SEL COM CAROTID INNOMINATE BILAT MOD SED  01/14/2018  . IR ANGIO VERTEBRAL SEL SUBCLAVIAN INNOMINATE UNI R MOD SED  07/20/2016  . IR ANGIO VERTEBRAL SEL VERTEBRAL BILAT MOD SED  01/14/2018  . IR ANGIO VERTEBRAL SEL VERTEBRAL UNI L MOD SED  07/20/2016  . IR RADIOLOGIST EVAL & MGMT  05/27/2016  . IR US GUIDE VASC ACCESS RIGHT  01/14/2018  . JOINT REPLACEMENT    . PERIPHERAL VASCULAR CATHETERIZATION N/A 12/10/2014   Procedure: Abdominal Aortogram;  Surgeon: Angelia Mould, MD;  Location: [facility];  Service: Cardiovascular;  Laterality: N/A;  . RADIOLOGY WITH ANESTHESIA N/A 02/08/2015   Procedure: RADIOLOGY WITH ANESTHESIA;  Surgeon: Luanne Bras, MD;  Location: [facility];  Service: Radiology;  Laterality: N/A;  . TEE WITHOUT CARDIOVERSION N/A 08/29/2014   Procedure: TRANSESOPHAGEAL ECHOCARDIOGRAM (TEE);  Surgeon: Josue Hector, MD;  Location: [facility] END[facility];  Service: Cardiovascular;  Laterality: N/A;  . TOTAL KNEE ARTHROPLASTY      Assessment / Plan / Recommendation Clinical Impression Patient is a 67 year old right-handed male with history of diabetes mellitus,seizure disorder maintained on Keppra CAD, polysubstance and alcohol abuse, diastolic congestive heart failure, COPD, chronically occluded right ICA, recent CVA with left-sided residual weakness maintained on aspirin and Plavix and was discharged to [facility] nursing facility for 3 weeks before returning home. Per chart review patient lives at [facility] senior apartments with assistance from a home health aide. He was using a walker while at home with noted falls.Presented 01/13/2018 with increasing weakness 2-3 days.UDS positive cocaine. MRI showed multifocal acute small right frontal MCA territory infarctions. Old right MCA territory infarcts. Cerebral angiogram again confirmed occluded right ICA stent without evidence of string sign and no plan for further intervention. Echocardiogram with ejection fraction of 55-60% without emboli.Chest  x-ray with incidental finding of right upper lobe nodule plan follow-up outpatient. Neurology follow-up currently maintained on aspirin and Plavix for CVA prophylaxis. Subcutaneous Lovenox for DVT prophylaxis. Tolerating a regular consistency diet. Therapy evaluations completed with recommendations of physical medicine rehabilitation consult. Patient was admitted for a compressive rehabilitation program 01/18/18.   Patient demonstrates mild cognitive impairments impacting selective attention, complex problem solving and short-term recall which impacts his safety with functional and familiar tasks. Patient also demonstrates a mild  dysarthria due to imprecise consonants and requires verbal cues for use of strategies to maximize intelligibility at the sentence level. Patient would benefit from skilled SLP intervention to maximize his cognitive functioning and functional communication prior to discharge.    Skilled Therapeutic Interventions          Administered a cognitive-linguistic evaluation, please see above for details. Educated patient in regards to current cognitive functioning and goals of skilled SLP intervention, he verbalized understanding and agreement.     SLP Assessment  Patient will need skilled [facility] Pathology Services during CIR admission    Recommendations  Oral Care Recommendations: Oral care BID Recommendations for Other Services: Neuropsych consult Patient destination: Home Follow up Recommendations: 24 hour supervision/assistance;Home Health SLP Equipment Recommended: None recommended by SLP    SLP Frequency 3 to 5 out of 7 days   SLP Duration  SLP Intensity  SLP Treatment/Interventions 12-14 days   Minumum of 1-2 x/day, 30 to 90 minutes  Cognitive remediation/compensation;Speech/Language facilitation;Cueing hierarchy;Environmental controls;Therapeutic Activities;Functional tasks;Patient/family education;Internal/external aids    Pain Pain Assessment Pain Scale: 0-10 Pain Score: 8   Prior Functioning Type of Home: Apartment  Lives With: Alone Available Help at Discharge: Personal care attendant  Short Term Goals: Week 1: SLP Short Term Goal 1 (Week 1): Patient will utilize speech intelligibility strategies at the sentence level with supervision verbal cues to achieve ~90% intelligibility.  SLP Short Term Goal 2 (Week 1): Patient will demonstrate functional problem solving for complex tasks with supervision verbal cues.  SLP Short Term Goal 3 (Week 1): Patient will recall new, daily information with supervision verbal and visual cues.  SLP Short Term Goal 4 (Week 1):  Patient will demonstrate selective attention in a mildly distracting enviornment for 45 minutes with supervision verbal cues for redirection.   Refer to Care Plan for Long Term Goals  Recommendations for other services: Neuropsych  Discharge Criteria: Patient will be discharged from SLP if patient refuses treatment 3 consecutive times without medical reason, if treatment goals not met, if there is a change in medical status, if patient makes no progress towards goals or if patient is discharged from hospital.  The above assessment, treatment plan, treatment alternatives and goals were discussed and mutually agreed upon: by patient  Samanta Gal 01/19/2018, 3:13 PM

## 2018-01-19 NOTE — Evaluation (Signed)
Occupational Therapy Assessment and Plan  Patient Details  Name: [name] MRN: [id number] Date of Birth: 08-Mar-1950  OT Diagnosis: disturbance of vision, hemiplegia affecting non-dominant side and muscle weakness (generalized) Rehab Potential: Rehab Potential (ACUTE ONLY): Good ELOS: 12-14 days   Today's Date: 01/19/2018 OT Individual Time: 1000-1100 OT Individual Time Calculation (min): 60 min     Problem List:  Patient Active Problem List   Diagnosis Date Noted  . Right middle cerebral artery stroke ([place]) 01/18/2018  . AMS (altered mental status) 11/29/2017  . Acute encephalopathy 11/28/2017  . Left shoulder pain 09/28/2017  . Cerebral thrombosis with cerebral infarction 07/18/2016  . Acute ischemic stroke ([place])   . Left hemiplegia ([place])   . Stroke-like symptoms 07/17/2016  . Insomnia 01/29/2016  . Depression 01/29/2016  . Alcohol abuse with intoxication ([place]) 10/16/2015  . Psoriasiform dermatitis 07/12/2015  . Dementia due to another medical condition ([place]) 07/12/2015  . Urinary incontinence 03/14/2015  . Type 2 diabetes mellitus with peripheral neuropathy ([place]) 03/13/2015  . Hemiparesis affecting left side as late effect of cerebrovascular accident ([place]) 02/18/2015  . Cerebrovascular accident (CVA) due to stenosis of right carotid artery ([place])   . Coronary artery disease involving native coronary artery of native heart without angina pectoris   . Tachypnea   . Thrombocytopenia ([place])   . History of stroke   . Stroke (cerebrum) ([place]) 02/06/2015  . Arterial ischemic stroke, MCA (middle cerebral artery), right, acute ([place])   . DOE (dyspnea on exertion) 11/23/2014  . PAF (paroxysmal atrial fibrillation) ([place]) 11/23/2014  . HLD (hyperlipidemia) 11/01/2014  . Tobacco use disorder 11/01/2014  . Diastolic CHF, acute on chronic (HCC) 11/01/2014  . Cerebral infarction due to stenosis of right carotid artery ([place]) 11/01/2014  . Carotid stenosis 11/01/2014  .  Hyperkalemia 10/24/2014  . H/O total knee replacement 10/24/2014  . Essential hypertension   . Polysubstance abuse ([place]) 08/28/2014  . Cocaine abuse ([place])   . Intracranial carotid stenosis   . Headache around the eyes 08/27/2014  . Acute CVA (cerebrovascular accident) ([place]) 08/27/2014  . CVA (cerebral vascular accident) ([place]) 08/26/2014  . History of DVT (deep vein thrombosis)   . Alcoholism ([place])   . Embolic stroke ([place])   . Cerebral infarction due to embolism of right middle cerebral artery ([place])   . Hypotension   . AKI (acute kidney injury) ([place])   . Stroke ([place])   . CVA (cerebral infarction) 08/01/2014  . Alcohol dependence with withdrawal with complication ([place]) 92/44/6286  . DVT (deep venous thrombosis) ([place]) 02/22/2014  . Lower extremity edema 02/21/2014  . Chronic diastolic congestive heart failure ([place]) 11/21/2013  . CAD (coronary artery disease) 11/21/2013  . Alcohol abuse 11/21/2013  . GERD (gastroesophageal reflux disease) 08/10/2013  . Gout 08/10/2013  . Tobacco abuse 07/26/2013  . Trichiasis without entropion 04/16/2013  . Dizziness 04/10/2013  . Eyelid lesion 01/24/2013  . Enophthalmos due to trauma 01/19/2013  . Medial orbital wall fracture 01/19/2013  . Closed blow-out fracture of floor of orbit ([place]) 01/19/2013  . Binocular vision disorder with diplopia 01/03/2013  . Fracture of inferior orbital wall ([place]) 01/03/2013  . Fracture of orbital floor ([place]) 01/03/2013  . Pain of left eye 01/03/2013  . Peripheral vascular disease ([place]) 07/27/2012  . COPD (chronic obstructive pulmonary disease) ([place]) 04/04/2012  . HTN (hypertension) 04/04/2012    Past Medical History:  Past Medical History:  Diagnosis Date  . Alcohol abuse    H/O  . Anxiety   .  Arthritis   . Asthma   . CAD (coronary artery disease) 05/27/10   Cath: severe single vessell CAD left cx midportion obtuse marginal 2 to 3.  [name] CHF (congestive heart failure) ([place])   . COPD (chronic obstructive  pulmonary disease) ([place])   . COPD (chronic obstructive pulmonary disease) ([place])   . Depression   . Diabetes mellitus, type 2 ([place]) 03/13/2015  . GERD (gastroesophageal reflux disease)   . HCAP (healthcare-associated pneumonia) 10/15/2015  . History of DVT of lower extremity   . Hyperlipemia   . Hyperlipidemia   . Hypertension   . Myocardial infarction ([place]) 2000  . Orbital fracture 12/2012  . OSA on CPAP   . Peripheral vascular disease, unspecified ([place])    08/20/10 doppler: increase in right ABI post-op. Left ABI stable. S/P bi-fem bypass surgery  . Pneumonia 04/04/2012  . Shortness of breath   . Stroke ([place])   . Tobacco abuse    Past Surgical History:  Past Surgical History:  Procedure Laterality Date  . abdoninal ao angio & bifem angio  05/27/10   Patent graft, occluded bil stents with no retrograde flow into the hypogastric arteries. 100% occl left ant. tibial artery, 70% to 80% to 100% stenosis right superficial fem artery above adductor canal. 100 % occl right ant. tibial vessell  . Aortogram w/ PTCA  09/292003  . BACK SURGERY    . CARDIAC CATHETERIZATION  05/27/10   severe CAD left cx  . CHOLECYSTECTOMY    . ESOPHAGOGASTRIC FUNDOPLICATION    . EYE SURGERY    . FEMORAL BYPASS  08/19/10   Right Fem-Pop  . IR ANGIO INTRA EXTRACRAN SEL COM CAROTID INNOMINATE BILAT MOD SED  07/20/2016  . IR ANGIO INTRA EXTRACRAN SEL COM CAROTID INNOMINATE BILAT MOD SED  01/14/2018  . IR ANGIO VERTEBRAL SEL SUBCLAVIAN INNOMINATE UNI R MOD SED  07/20/2016  . IR ANGIO VERTEBRAL SEL VERTEBRAL BILAT MOD SED  01/14/2018  . IR ANGIO VERTEBRAL SEL VERTEBRAL UNI L MOD SED  07/20/2016  . IR RADIOLOGIST EVAL & MGMT  05/27/2016  . IR US GUIDE VASC ACCESS RIGHT  01/14/2018  . JOINT REPLACEMENT    . PERIPHERAL VASCULAR CATHETERIZATION N/A 12/10/2014   Procedure: Abdominal Aortogram;  Surgeon: Angelia Mould, MD;  Location: [facility];  Service: Cardiovascular;  Laterality: N/A;  . RADIOLOGY WITH  ANESTHESIA N/A 02/08/2015   Procedure: RADIOLOGY WITH ANESTHESIA;  Surgeon: Luanne Bras, MD;  Location: [facility];  Service: Radiology;  Laterality: N/A;  . TEE WITHOUT CARDIOVERSION N/A 08/29/2014   Procedure: TRANSESOPHAGEAL ECHOCARDIOGRAM (TEE);  Surgeon: Josue Hector, MD;  Location: [facility] ENDOSCOPY;  Service: Cardiovascular;  Laterality: N/A;  . TOTAL KNEE ARTHROPLASTY      Assessment & Plan Clinical Impression: [name] is a 67 year old right-handed male with history of diabetes mellitus,seizure disorder maintained on Keppra CAD, polysubstance and alcohol abuse, diastolic congestive heart failure, COPD, chronically occluded right ICA, recent CVA with left-sided residual weakness maintained on aspirin and Plavix and was discharged to [facility] facility for 3 weeks before returning home. Per chart review patient lives at [facility] senior apartments with assistance from a home health aide. He was using a walker while at home with noted falls.Presented 01/13/2018 with increasing weakness 2-3 days.UDS positive cocaine. MRI showed multifocal acute small right frontal MCA territory infarctions. Old right MCA territory infarcts. Cerebral angiogram again confirmed occluded right ICA stent without evidence of string sign and no plan for  further intervention. Echocardiogram with ejection fraction of 55-60% without emboli.Chest x-ray with incidental finding of right upper lobe nodule plan follow-up outpatient. Neurology follow-up currently maintained on aspirin and Plavix for CVA prophylaxis. Subcutaneous Lovenox for DVT prophylaxis. Tolerating a regular consistency diet. Therapy evaluations completed with recommendations of physical medicine rehabilitation consult. Patient was admitted for a compressive rehabilitation program.   Patient transferred to CIR on 01/18/2018 .    Patient currently requires mod with basic self-care skills secondary to muscle weakness, decreased  cardiorespiratoy endurance, decreased coordination and decreased standing balance, hemiplegia and decreased balance strategies.  Prior to hospitalization, patient could complete self care  with mod.except for toileting with mod I.  Patient will benefit from skilled intervention to increase independence with basic self-care skills prior to discharge home with care partner for part of the day.  Anticipate patient will require 24 hour supervision and follow up home health.  Pt may need SNF due to lack of caregiver A.  OT - End of Session Activity Tolerance: Tolerates 10 - 20 min activity with multiple rests Endurance Deficit: Yes Endurance Deficit Description: becomes SOB with exertion OT Assessment Rehab Potential (ACUTE ONLY): Good OT Barriers to Discharge: Decreased caregiver support;Lack of/limited family support OT Barriers to Discharge Comments: history of falls OT Patient demonstrates impairments in the following area(s): Balance;Endurance;Motor OT Basic ADL's Functional Problem(s): Bathing;Dressing;Toileting OT Transfers Functional Problem(s): Toilet;Tub/Shower OT Additional Impairment(s): Fuctional Use of Upper Extremity OT Plan OT Intensity: Minimum of 1-2 x/day, 45 to 90 minutes OT Frequency: 5 out of 7 days OT Duration/Estimated Length of Stay: 12-14 days OT Treatment/Interventions: Balance/vestibular training;Functional electrical stimulation;Neuromuscular re-education;Patient/family education;Self Care/advanced ADL retraining;Therapeutic Exercise;UE/LE Coordination activities;UE/LE Strength taining/ROM;Therapeutic Activities;Psychosocial support;Functional mobility training;DME/adaptive equipment instruction;Discharge planning OT Self Feeding Anticipated Outcome(s): Independent OT Basic Self-Care Anticipated Outcome(s): S with UB self care, min A LB self care OT Toileting Anticipated Outcome(s): toileting OT Bathroom Transfers Anticipated Outcome(s): mod I to toilet, S to tub  OT  Recommendation Patient destination: Home Follow Up Recommendations: Skilled nursing facility Equipment Recommended: Tub/shower bench   Skilled Therapeutic Intervention Pt seen for initial evaluation and ADL training with a focus on  Adaptive strategies, functional mobility.  Explained role of OT, discussed POC, pt's goals. Pt eager fo ra shower. Completed all transfers with min A and mod for LB and min for UB self care. Pt did get out of breath easily but was able to tolerate the session.   Pt resting in w/c with all needs met.  PT arrived for next session.  OT Evaluation Precautions/Restrictions  Precautions Precautions: Fall Restrictions Weight Bearing Restrictions: No    Vital Signs Therapy Vitals BP: (!) 130/59(supine) Pain Pain Assessment Pain Scale: 0-10 Pain Score: 8  Home Living/Prior Functioning Home Living Family/patient expects to be discharged to:: Private residence Living Arrangements: Alone Available Help at Discharge: Personal care attendant Type of Home: Apartment Home Access: Elevator Home Layout: One level Bathroom Shower/Tub: Tub/shower unit Additional Comments: had a manual w/c but got lost in ALF in [place] and they lost the power cord for electric w/c  Lives With: Alone Prior Function Level of Independence: Needs assistance with ADLs, Independent with transfers, Requires assistive device for independence, Other (comment)(hx of multiple falls) Driving: No ADL ADL Eating: Set up Grooming: Setup Upper Body Bathing: Minimal assistance Where Assessed-Upper Body Bathing: Shower Lower Body Bathing: Minimal assistance Where Assessed-Lower Body Bathing: Shower Upper Body Dressing: Minimal assistance Where Assessed-Upper Body Dressing: Wheelchair Lower Body Dressing: Moderate assistance Where Assessed-Lower Body Dressing:  Teaching laboratory technician: Environmental education officer Method: Radiographer, therapeutic: Grab  bars Vision Baseline Vision/History: Wears glasses Wears Glasses: Reading only Patient Visual Report: Diplopia Vision Assessment?: Vision impaired- to be further tested in functional context Eye Alignment: Impaired (comment) Perception  Perception: Within Functional Limits Praxis Praxis: Intact Cognition Overall Cognitive Status: Within Functional Limits for tasks assessed Arousal/Alertness: Awake/alert Orientation Level: Person;Place;Situation Person: Oriented Place: Oriented Situation: Oriented Year: 2019 Month: December Day of Week: Correct Memory: Appears intact Immediate Memory Recall: Sock;Blue;Bed Memory Recall: Sock;Blue;Bed Memory Recall Sock: Without Cue Memory Recall Blue: Without Cue Memory Recall Bed: Without Cue Awareness: Appears intact Safety/Judgment: Appears intact Sensation Sensation Light Touch: Appears Intact Hot/Cold: Appears Intact Proprioception: Appears Intact(in LLE) Stereognosis: Impaired by gross assessment Coordination Gross Motor Movements are Fluid and Coordinated: No Fine Motor Movements are Fluid and Coordinated: No Coordination and Movement Description: L arm with 20% AROM  Finger Nose Finger Test: unable to perform with L hand Motor  Motor Motor: Hemiplegia Mobility    min stand pivot transfer Trunk/Postural Assessment  Cervical Assessment Cervical Assessment: Within Functional Limits Thoracic Assessment Thoracic Assessment: Within Functional Limits Lumbar Assessment Lumbar Assessment: Within Functional Limits Postural Control Postural Control: Within Functional Limits  Balance Dynamic Sitting Balance Dynamic Sitting - Level of Assistance: 5: Stand by assistance Static Standing Balance Static Standing - Level of Assistance: 4: Min assist Dynamic Standing Balance Dynamic Standing - Level of Assistance: 3: Mod assist Extremity/Trunk Assessment RUE Assessment RUE Assessment: Within Functional Limits LUE Assessment Active  Range of Motion (AROM) Comments: 20% AROM throughout UE LUE Body System: Neuro Brunstrum levels for arm and hand: Arm Brunstrum level for arm: Stage II Synergy is developing     Refer to Care Plan for Long Term Goals  Recommendations for other services: None    Discharge Criteria: Patient will be discharged from OT if patient refuses treatment 3 consecutive times without medical reason, if treatment goals not met, if there is a change in medical status, if patient makes no progress towards goals or if patient is discharged from hospital.  The above assessment, treatment plan, treatment alternatives and goals were discussed and mutually agreed upon: by patient  [place] 01/19/2018, 1:10 PM

## 2018-01-19 NOTE — Progress Notes (Signed)
[facility] PHYSICAL MEDICINE & REHABILITATION PROGRESS NOTE   Subjective/Complaints:  Discussed stroke risk factors cocaine and smoking, denies etoh  Poor sleep last noc  ROS- + cough difficult to cough up sputum, no SOB, No CP or abd pain , no N/V/D   Objective:   No results found. Recent Labs    01/18/18 1542 01/19/18 0655  WBC 5.6 4.8  HGB 11.7* 12.2*  HCT 38.1* 41.3  PLT 157 176   Recent Labs    01/18/18 1542 01/19/18 0655  NA  --  145  K  --  4.6  CL  --  108  CO2  --  29  GLUCOSE  --  106*  BUN  --  18  CREATININE 0.78 0.74  CALCIUM  --  9.6    Intake/Output Summary (Last 24 hours) at 01/19/2018 0952 Last data filed at 01/19/2018 6213 Gross per 24 hour  Intake 240 ml  Output 1750 ml  Net -1510 ml     Physical Exam: Vital Signs Blood pressure (!) 154/59, pulse 71, temperature (!) 97.5 F (36.4 C), temperature source Oral, resp. rate 18, height 6' (1.829 m), weight 102.4 kg, SpO2 95 %.  General: No acute distress Mood and affect are appropriate Heart: Regular rate and rhythm no rubs murmurs or extra sounds Lungs: Clear to auscultation, breathing unlabored, no rales or wheezes Abdomen: Positive bowel sounds, soft nontender to palpation, nondistended Extremities: No clubbing, cyanosis, or edema Skin: No evidence of breakdown, no evidence of rash Neurologic: Cranial nerves II through XII intact, motor strength is 5/5 in right  deltoid, bicep, tricep, grip, hip flexor, knee extensors, ankle dorsiflexor and plantar flexor 2- LUE, 4- LLE Sensory exam reduced sensation to light touch and proprioception in bilateral   lower extremities and LUE  Cerebellar exam normal finger to nose to finger as well as heel to shin in bilateral upper and lower extremities Musculoskeletal: Full range of motion in all 4 extremities. No joint swelling   Assessment/Plan: 1. Functional deficits secondary to RIght MCA infarct  which require 3+ hours per day of  interdisciplinary therapy in a comprehensive inpatient rehab setting.  Physiatrist is providing close team supervision and 24 hour management of active medical problems listed below.  Physiatrist and rehab team continue to assess barriers to discharge/monitor patient progress toward functional and medical goals  Care Tool:  Bathing              Bathing assist       Upper Body Dressing/Undressing Upper body dressing   What is the patient wearing?: Hospital gown only    Upper body assist Assist Level: Moderate Assistance - Patient 50 - 74%    Lower Body Dressing/Undressing Lower body dressing      What is the patient wearing?: (nothing on )     Lower body assist       Toileting Toileting    Toileting assist Assist for toileting: Moderate Assistance - Patient 50 - 74%     Transfers Chair/bed transfer  Transfers assist     Chair/bed transfer assist level: Minimal Assistance - Patient > 75%     Locomotion Ambulation   Ambulation assist              Walk 10 feet activity   Assist           Walk 50 feet activity   Assist           Walk 150 feet activity   Assist  Walk 10 feet on uneven surface  activity   Assist           Wheelchair     Assist               Wheelchair 50 feet with 2 turns activity    Assist            Wheelchair 150 feet activity     Assist          Medical Problem List and Plan: 1.Left hemiparesissecondary to Right MCA small embolic infarcts due to chronic right ICA occlusion in the setting of cocaine abuse and suspect medical noncompliance CIR evals 2. DVT Prophylaxis/Anticoagulation: Subcutaneous Lovenox. Monitor for any bleeding episodes 3. Pain Management:Lyrica 150 mg twice a day 4. Mood:Namenda 10 mg twice a day,Cymbalta 60 mg daily, Atarax as needed for anxiety 5. Neuropsych: This patientiscapable of making decisions on hisown behalf.   6. Skin/Wound Care:Routine skin checks 7. Fluids/Electrolytes/Nutrition:Routine and out's with follow-up chemistriesupon admit. 8. Seizure disorder. Keppra 500 mg twice a day 9. History of polysubstance abuse / abuse tobacco abuse. UDS positive for cocaine. Provide counseling. Continue NicoDerm patch 10. Diabetes mellitus with peripheral neuropathy.Hemoglobin A1c 5.4. SSI. Check blood sugars before meals and at bedtime CBG (last 3)  Recent Labs    01/18/18 1641 01/18/18 2116 01/19/18 0619  GLUCAP 94 111* 92  normal will d/c CBG and SSI 11. Diastolic congestive heart failure. Monitor for any signs of fluid overload -check daily weights 12. Hyperlipidemia. Lipitor 13. History of gout. Zyloprim 300 mg daily 14. Constipation. Laxative assistance    LOS: 1 days A FACE TO FACE EVALUATION WAS PERFORMED  Charlett Blake 01/19/2018, 9:52 AM

## 2018-01-19 NOTE — Progress Notes (Signed)
Social Work  Social Work Assessment and Plan  Patient Details  Name: [name] MRN: [id number] Date of Birth: 03-11-1950  Today's Date: 01/19/2018  Problem List:  Patient Active Problem List   Diagnosis Date Noted  . Right middle cerebral artery stroke ([place]) 01/18/2018  . AMS (altered mental status) 11/29/2017  . Acute encephalopathy 11/28/2017  . Left shoulder pain 09/28/2017  . Cerebral thrombosis with cerebral infarction 07/18/2016  . Acute ischemic stroke ([place])   . Left hemiplegia ([place])   . Stroke-like symptoms 07/17/2016  . Insomnia 01/29/2016  . Depression 01/29/2016  . Alcohol abuse with intoxication ([place]) 10/16/2015  . Psoriasiform dermatitis 07/12/2015  . Dementia due to another medical condition ([place]) 07/12/2015  . Urinary incontinence 03/14/2015  . Type 2 diabetes mellitus with peripheral neuropathy ([place]) 03/13/2015  . Hemiparesis affecting left side as late effect of cerebrovascular accident ([place]) 02/18/2015  . Cerebrovascular accident (CVA) due to stenosis of right carotid artery ([place])   . Coronary artery disease involving native coronary artery of native heart without angina pectoris   . Tachypnea   . Thrombocytopenia ([place])   . History of stroke   . Stroke (cerebrum) ([place]) 02/06/2015  . Arterial ischemic stroke, MCA (middle cerebral artery), right, acute ([place])   . DOE (dyspnea on exertion) 11/23/2014  . PAF (paroxysmal atrial fibrillation) ([place]) 11/23/2014  . HLD (hyperlipidemia) 11/01/2014  . Tobacco use disorder 11/01/2014  . Diastolic CHF, acute on chronic (HCC) 11/01/2014  . Cerebral infarction due to stenosis of right carotid artery ([place]) 11/01/2014  . Carotid stenosis 11/01/2014  . Hyperkalemia 10/24/2014  . H/O total knee replacement 10/24/2014  . Essential hypertension   . Polysubstance abuse ([place]) 08/28/2014  . Cocaine abuse ([place])   . Intracranial carotid stenosis   . Headache around the eyes 08/27/2014  . Acute CVA (cerebrovascular  accident) ([place]) 08/27/2014  . CVA (cerebral vascular accident) ([place]) 08/26/2014  . History of DVT (deep vein thrombosis)   . Alcoholism ([place])   . Embolic stroke ([place])   . Cerebral infarction due to embolism of right middle cerebral artery ([place])   . Hypotension   . AKI (acute kidney injury) ([place])   . Stroke ([place])   . CVA (cerebral infarction) 08/01/2014  . Alcohol dependence with withdrawal with complication ([place]) 67/89/3810  . DVT (deep venous thrombosis) ([place]) 02/22/2014  . Lower extremity edema 02/21/2014  . Chronic diastolic congestive heart failure ([place]) 11/21/2013  . CAD (coronary artery disease) 11/21/2013  . Alcohol abuse 11/21/2013  . GERD (gastroesophageal reflux disease) 08/10/2013  . Gout 08/10/2013  . Tobacco abuse 07/26/2013  . Trichiasis without entropion 04/16/2013  . Dizziness 04/10/2013  . Eyelid lesion 01/24/2013  . Enophthalmos due to trauma 01/19/2013  . Medial orbital wall fracture 01/19/2013  . Closed blow-out fracture of floor of orbit ([place]) 01/19/2013  . Binocular vision disorder with diplopia 01/03/2013  . Fracture of inferior orbital wall ([place]) 01/03/2013  . Fracture of orbital floor ([place]) 01/03/2013  . Pain of left eye 01/03/2013  . Peripheral vascular disease ([place]) 07/27/2012  . COPD (chronic obstructive pulmonary disease) ([place]) 04/04/2012  . HTN (hypertension) 04/04/2012   Past Medical History:  Past Medical History:  Diagnosis Date  . Alcohol abuse    H/O  . Anxiety   . Arthritis   . Asthma   . CAD (coronary artery disease) 05/27/10   Cath: severe single vessell CAD left cx midportion obtuse marginal 2 to 3.  [name] CHF (congestive heart failure) ([place])   .  COPD (chronic obstructive pulmonary disease) ([place])   . COPD (chronic obstructive pulmonary disease) ([place])   . Depression   . Diabetes mellitus, type 2 ([place]) 03/13/2015  . GERD (gastroesophageal reflux disease)   . HCAP (healthcare-associated pneumonia) 10/15/2015  . History of DVT of lower  extremity   . Hyperlipemia   . Hyperlipidemia   . Hypertension   . Myocardial infarction ([place]) 2000  . Orbital fracture 12/2012  . OSA on CPAP   . Peripheral vascular disease, unspecified ([place])    08/20/10 doppler: increase in right ABI post-op. Left ABI stable. S/P bi-fem bypass surgery  . Pneumonia 04/04/2012  . Shortness of breath   . Stroke ([place])   . Tobacco abuse    Past Surgical History:  Past Surgical History:  Procedure Laterality Date  . abdoninal ao angio & bifem angio  05/27/10   Patent graft, occluded bil stents with no retrograde flow into the hypogastric arteries. 100% occl left ant. tibial artery, 70% to 80% to 100% stenosis right superficial fem artery above adductor canal. 100 % occl right ant. tibial vessell  . Aortogram w/ PTCA  09/292003  . BACK SURGERY    . CARDIAC CATHETERIZATION  05/27/10   severe CAD left cx  . CHOLECYSTECTOMY    . ESOPHAGOGASTRIC FUNDOPLICATION    . EYE SURGERY    . FEMORAL BYPASS  08/19/10   Right Fem-Pop  . IR ANGIO INTRA EXTRACRAN SEL COM CAROTID INNOMINATE BILAT MOD SED  07/20/2016  . IR ANGIO INTRA EXTRACRAN SEL COM CAROTID INNOMINATE BILAT MOD SED  01/14/2018  . IR ANGIO VERTEBRAL SEL SUBCLAVIAN INNOMINATE UNI R MOD SED  07/20/2016  . IR ANGIO VERTEBRAL SEL VERTEBRAL BILAT MOD SED  01/14/2018  . IR ANGIO VERTEBRAL SEL VERTEBRAL UNI L MOD SED  07/20/2016  . IR RADIOLOGIST EVAL & MGMT  05/27/2016  . IR US GUIDE VASC ACCESS RIGHT  01/14/2018  . JOINT REPLACEMENT    . PERIPHERAL VASCULAR CATHETERIZATION N/A 12/10/2014   Procedure: Abdominal Aortogram;  Surgeon: Angelia Mould, MD;  Location: [facility];  Service: Cardiovascular;  Laterality: N/A;  . RADIOLOGY WITH ANESTHESIA N/A 02/08/2015   Procedure: RADIOLOGY WITH ANESTHESIA;  Surgeon: Luanne Bras, MD;  Location: [facility];  Service: Radiology;  Laterality: N/A;  . TEE WITHOUT CARDIOVERSION N/A 08/29/2014   Procedure: TRANSESOPHAGEAL ECHOCARDIOGRAM (TEE);  Surgeon: Josue Hector, MD;  Location[facility];  Service: Cardiovascular;  Laterality: N/A;  . TOTAL KNEE ARTHROPLASTY     Social History:  reports that he has been smoking cigars and cigarettes. He started smoking about 56 years ago. He has a 25.00 pack-year smoking history. He has quit using smokeless tobacco.  His smokeless tobacco use included chew. He reports that he drinks alcohol. He reports that he has current or past drug history. Drug: Cocaine.  Family / Support Systems Marital Status: Divorced Patient Roles: Other (Comment)(Friend) Other Supports: [name] Echols-friend/PCS worker/POA Anticipated Caregiver: Self and [name] Ability/Limitations of Caregiver: 3 hours per day otherwise alone Caregiver Availability: Other (Comment)(3 hours per day) Family Dynamics: Pt has no fmaily according to him, he has known Retail buyer for the last eight years and she is like family to him. He feels fortunate to have her in his life. He has some neighbors who will visit also.  Social History Preferred language: English Religion: Baptist Cultural Background: No issues Education: [employer] Read: Yes Write: Yes Employment Status: Disabled Public relations account executive Issues: No issues Guardian/Conservator: According to MD  pt is capable of making his own decisions while here. [name] is his POA and he wants her involved in any decision while here   Abuse/Neglect Abuse/Neglect Assessment Can Be Completed: Yes Physical Abuse: Denies Verbal Abuse: Denies Sexual Abuse: Denies Exploitation of patient/patient's resources: Denies Self-Neglect: Denies  Emotional Status Pt's affect, behavior and adjustment status: Pt is motivated to do well here and get back to his apartment. He has no plans to go anywhere else, he has been to a NH and ALF and both he feels were awful. He was doing well while home for the three weeks he was there before this happened. He plans to get back to this level. Recent Psychosocial Issues: multiple  health issues he deals with and recent DC from ALF Psychiatric History: History of depression/anxiety takes medications for this and feels it helps, will ask neuro-psych to see him while here for his substance abuse issues and depression/anxiety issues. Substance Abuse History: History of ETOH and he reports cocaine but disucssed he was positive on this admission and he feels just one time he slipped up. He feels he can quit and plans on doing this now he doesn't want to be back here. Will continue to disucss resources while here.  Patient / Family Perceptions, Expectations & Goals Pt/Family understanding of illness & functional limitations: Pt can explain his stroke he feels he is getting better and making progress. His congestion is concerning to him and plans to talk with the MD about this. He does ask questions and feels he has a good understanding of his treatment plan going forward,. Premorbid pt/family roles/activities: friend, neighbor Anticipated changes in roles/activities/participation: plans to resume this Pt/family expectations/goals: Pt states: " I plan on going hoem no matter what I'm not going back to a nursing home."  [name] states: " He had a bad expericence and plans on going home from here."  [employer]: Other (Comment)(resident of [employer]) Premorbid Home Care/DME Agencies: Other (Comment)(has some equipment and HH in the past-has PCS services) Transportation available at discharge: SCAT and Medicaid transportation Resource referrals recommended: Neuropsychology, Support group (specify)  Discharge Planning Living Arrangements: Alone Support Systems: Friends/neighbors Type of Residence: Private residence Insurance Resources: Multimedia programmer (specify), Medicaid (specify county)(UHC-Medicare) Financial Resources: [employer] Financial Screen Referred: Previously completed Living Expenses: Rent Money Management: Patient Does the patient have any  problems obtaining your medications?: No Home Management: [place] worker-[name] Patient/Family Preliminary Plans: Plans on returning home to [facility] with PCS aide for three hours. He plans on being able to move around on his own and will go home from here. Will work with pt and [name] on safest plan for him. He has been to other facilities and not had a good experience.  Sw Barriers to Discharge: Decreased caregiver support, Medication compliance Sw Barriers to Discharge Comments: Does not have 24 hr care and at times is non-compliant with his medications Social Work Anticipated Follow Up Needs: HH/OP, Support Group  Clinical Impression Pleasant congested gentleman who is willing to work hard and get back to his mod/i level. He plans to go home no matter what he has no plans on going to a NH or ALF, he has been there before and has no plans to go back. Will work with [name] and pt on discharge plans. Will make referral for neuro-psych to see while here for his anxiety/depression and substance abuse.  Elease Hashimoto 01/19/2018, 8:46 AM

## 2018-01-20 ENCOUNTER — Inpatient Hospital Stay (HOSPITAL_COMMUNITY): Payer: Self-pay | Admitting: Physical Therapy

## 2018-01-20 ENCOUNTER — Ambulatory Visit (HOSPITAL_COMMUNITY): Payer: Medicare Other | Attending: Family Medicine

## 2018-01-20 ENCOUNTER — Inpatient Hospital Stay (HOSPITAL_COMMUNITY): Payer: Self-pay

## 2018-01-20 ENCOUNTER — Inpatient Hospital Stay (HOSPITAL_COMMUNITY): Payer: Self-pay | Admitting: Speech Pathology

## 2018-01-20 MED ORDER — ZOLPIDEM TARTRATE 5 MG PO TABS
5.0000 mg | ORAL_TABLET | Freq: Every evening | ORAL | Status: DC | PRN
  Administered 2018-01-20 – 2018-02-01 (×9): 5 mg via ORAL
  Filled 2018-01-20 (×9): qty 1

## 2018-01-20 NOTE — Progress Notes (Signed)
[facility] PHYSICAL MEDICINE & REHABILITATION PROGRESS NOTE   Subjective/Complaints:  Patient without new complaints today.  Slept okay last night although still with some interruptions  ROS- + cough difficult to cough up sputum, no SOB, No CP or abd pain , no N/V/D   Objective:   No results found. Recent Labs    01/18/18 1542 01/19/18 0655  WBC 5.6 4.8  HGB 11.7* 12.2*  HCT 38.1* 41.3  PLT 157 176   Recent Labs    01/18/18 1542 01/19/18 0655  NA  --  145  K  --  4.6  CL  --  108  CO2  --  29  GLUCOSE  --  106*  BUN  --  18  CREATININE 0.78 0.74  CALCIUM  --  9.6    Intake/Output Summary (Last 24 hours) at 01/20/2018 1549 Last data filed at 01/20/2018 1337 Gross per 24 hour  Intake 720 ml  Output 1800 ml  Net -1080 ml     Physical Exam: Vital Signs Blood pressure (!) 96/56, pulse 74, temperature (!) 97.4 F (36.3 C), resp. rate 16, height 6' (1.829 m), weight 102.4 kg, SpO2 93 %.  General: No acute distress Mood and affect are appropriate Heart: Regular rate and rhythm no rubs murmurs or extra sounds Lungs: Clear to auscultation, breathing unlabored, no rales or wheezes Abdomen: Positive bowel sounds, soft nontender to palpation, nondistended Extremities: No clubbing, cyanosis, or edema Skin: No evidence of breakdown, no evidence of rash Neurologic: Cranial nerves II through XII intact, motor strength is 5/5 in right  deltoid, bicep, tricep, grip, hip flexor, knee extensors, ankle dorsiflexor and plantar flexor 2- LUE, 4- LLE Sensory exam reduced sensation to light touch and proprioception in bilateral   lower extremities and LUE  Cerebellar exam normal finger to nose to finger as well as heel to shin in bilateral upper and lower extremities Musculoskeletal: Full range of motion in all 4 extremities. No joint swelling   Assessment/Plan: 1. Functional deficits secondary to RIght MCA infarct  which require 3+ hours per day of interdisciplinary therapy in  a comprehensive inpatient rehab setting.  Physiatrist is providing close team supervision and 24 hour management of active medical problems listed below.  Physiatrist and rehab team continue to assess barriers to discharge/monitor patient progress toward functional and medical goals  Care Tool:  Bathing    Body parts bathed by patient: Chest, Abdomen, Front perineal area, Buttocks, Right upper leg, Left upper leg, Right lower leg, Left lower leg, Face, Left arm   Body parts bathed by helper: Right arm     Bathing assist Assist Level: Contact Guard/Touching assist     Upper Body Dressing/Undressing Upper body dressing   What is the patient wearing?: Pull over shirt    Upper body assist Assist Level: Minimal Assistance - Patient > 75%    Lower Body Dressing/Undressing Lower body dressing      What is the patient wearing?: Pants     Lower body assist Assist for lower body dressing: Moderate Assistance - Patient 50 - 74%     Toileting Toileting    Toileting assist Assist for toileting: Moderate Assistance - Patient 50 - 74%     Transfers Chair/bed transfer  Transfers assist     Chair/bed transfer assist level: Minimal Assistance - Patient > 75%     Locomotion Ambulation   Ambulation assist      Assist level: Minimal Assistance - Patient > 75% Assistive device: Walker-hemi Max  distance: 36ft    Walk 10 feet activity   Assist     Assist level: Minimal Assistance - Patient > 75% Assistive device: Walker-hemi   Walk 50 feet activity   Assist    Assist level: Minimal Assistance - Patient > 75% Assistive device: Walker-hemi    Walk 150 feet activity   Assist Walk 150 feet activity did not occur: Safety/medical concerns(medical- felt like he was going to pass out )         Walk 10 feet on uneven surface  activity   Assist Walk 10 feet on uneven surfaces activity did not occur: Safety/medical concerns(medical- felt like he was going to  pass out )         Wheelchair     Assist Will patient use wheelchair at discharge?: Yes Type of Wheelchair: Manual    Wheelchair assist level: Supervision/Verbal cueing Max wheelchair distance: 88ft     Wheelchair 50 feet with 2 turns activity    Assist        Assist Level: Supervision/Verbal cueing   Wheelchair 150 feet activity     Assist Wheelchair 150 feet activity did not occur: Safety/medical concerns(fatigue )        Medical Problem List and Plan: 1.Left hemiparesissecondary to Right MCA small embolic infarcts due to chronic right ICA occlusion in the setting of cocaine abuse and suspect medical noncompliance CIR PT OT speech 2. DVT Prophylaxis/Anticoagulation: Subcutaneous Lovenox. Monitor for any bleeding episodes 3. Pain Management:Lyrica 150 mg twice a day 4. Mood:Namenda 10 mg twice a day,Cymbalta 60 mg daily, Atarax as needed for anxiety 5. Neuropsych: This patientiscapable of making decisions on hisown behalf.  6. Skin/Wound Care:Routine skin checks 7. Fluids/Electrolytes/Nutrition:Routine and out's with follow-up chemistriesupon admit. 8. Seizure disorder. Keppra 500 mg twice a day 9. History of polysubstance abuse / abuse tobacco abuse. UDS positive for cocaine. Provide counseling. Continue NicoDerm patch 10. Diabetes mellitus with peripheral neuropathy.Hemoglobin A1c 5.4. SSI. Check blood sugars before meals and at bedtime CBG (last 3)  Recent Labs    01/18/18 1641 01/18/18 2116 01/19/18 0619  GLUCAP 94 111* 92  normal will d/c CBG and SSI 11. Diastolic congestive heart failure. Monitor for any signs of fluid overload -check daily weights Vitals:   01/20/18 0900 01/20/18 1506  BP:  (!) 96/56  Pulse:  74  Resp:  16  Temp:  (!) 97.4 F (36.3 C)  SpO2: 93% 93%  Blood pressure on low side today.  On beta-blocker for CHF monitor for trend may need to reduce dose 12. Hyperlipidemia. Lipitor 13.  History of gout. Zyloprim 300 mg daily 14. Constipation. Laxative assistance    LOS: 2 days A FACE TO FACE EVALUATION WAS PERFORMED  Charlett Blake 01/20/2018, 3:49 PM

## 2018-01-20 NOTE — Progress Notes (Signed)
Occupational Therapy Session Note  Patient Details  Name: [name] MRN: [id number] Date of Birth: September 05, 1950  Today's Date: 01/20/2018 OT Individual Time: 1300-1355 OT Individual Time Calculation (min): 55 min    Short Term Goals: Week 1:  OT Short Term Goal 1 (Week 1): Pt will be able to complete stand pivot to toilet with close S. OT Short Term Goal 2 (Week 1): Pt will be able to bathe with long handled sponge with CGA. OT Short Term Goal 3 (Week 1): Pt will don shirt with (with A to lift L arm) S. OT Short Term Goal 4 (Week 1): Pt will don pants with min A. OT Short Term Goal 5 (Week 1): Pt will use L arm as a stabilizer with mod cues.   Skilled Therapeutic Interventions/Progress Updates:    Pt resting in bed upon arrival and agreeable to therapy.  OT intervention with focus on bed mobility, sitting tolerance/balance, and safety awareness.  Pt continues to c/o dizziness and nausea with transitional movements and when sitting.  Did not attempt to stand 2/2 ongoing dizziness and nausea when sitting.  Pt returned to supine with HOB elevated to 15 degrees. All needs within reach and bed alarm activated.   Therapy Documentation Precautions:  Precautions Precautions: Fall, Other (comment) Precaution Comments: L hemiparesis UE>LE, positive orthostatics- watch BP  Restrictions Weight Bearing Restrictions: No Pain: Pain Assessment Pain Scale: 0-10 Pain Score: 0-No pain   Therapy/Group: Individual Therapy  Leroy Libman 01/20/2018, 2:43 PM

## 2018-01-20 NOTE — Progress Notes (Signed)
Physical Therapy Session Note (Orthostatic and Vestibular Assessments)   Patient Details  Name: [name] MRN: [id number] Date of Birth: [birth year]  Today's Date: 01/20/2018 PT Individual Time: 0907-1005 PT Individual Time Calculation (min): 58 min   Short Term Goals: Week 1:  PT Short Term Goal 1 (Week 1): Patient to be able to perform all bed mobility with S  PT Short Term Goal 2 (Week 1): Patient to perform all functional bed to chair transfers with min guard  PT Short Term Goal 3 (Week 1): Patient to be able to ambulate at least 129f with LRAD and min guard  PT Short Term Goal 4 (Week 1): Patient to be able to ascend/descent single step curb with LRAD and MinA   Skilled Therapeutic Interventions/Progress Updates:    patient received in bed, pleasant and motivated to participate in therapy but reporting ongoing dizziness and intermittent diploplia. Applied thigh high TEDS totalA for time management, then proceeded with orthostatic measures (see text below for details) with significantly positive readings and patient becoming tachypenic and pale when standing beside bed, recovered with return to sitting. Unable to progress mobility today due to severe symptomatic presentation of orthostatic BP. Did perform vestibular examination due to ongoing intermittent diploplia and dizziness somewhat inconsistent with only orthostatic hypotension (see text below for details) but feel that patient likely has central origin vertigo with poor VOR reflexes as well as possible BPPV however recommend retest for BPPV as any torsion was mild and inconsistent. He was left in bed with bed alarm active, all other needs met and education completed this morning. RN and next treating therapist educated on findings this session.   Orthostatic measures:  Supine 141/71 Sitting 84/58 Sitting for 3 minutes 120/66 Standing 109/72 Standing for 3 minutes 81/60 RH 114 high respiratory rate and pale  Return to  sitting 97/74 After vestibular testing 106/68 Return to supine 130/71  Vestibular assessment:     01/20/18 0001  Vestibular Assessment  General Observation mild disconjugate gaze with L eye   Symptom Behavior  Type of Dizziness "Funny feeling in head"  Frequency of Dizziness constant   Duration of Dizziness constant   Aggravating Factors Spontaneous onset  Relieving Factors Lying supine  Occulomotor Exam  Occulomotor Alignment Abnormal  Spontaneous Comment (appears to have spontaneous/inconsistent torsional nystagmus)  Gaze-induced Comment (R beating nystagmus R gaze/L beating nystagmus L gaze )  Head shaking Horizontal Absent  Head Shaking Vertical Absent  Smooth Pursuits Comment (impaired L eye )  Saccades Slow  Vestibulo-Occular Reflex  VOR 1 Head Only (x 1 viewing) impaired with poor ability to maintain gaze when turning head to L   VOR Cancellation Comment  Comment attempted VOR cancellation, limited by thoracic stiffness   Visual Acuity  Static intermittent diploplia   Dynamic intermittent diploplia   Positional Testing  Dix-Hallpike Dix-Hallpike Right;Dix-Hallpike Left  Sidelying Test Sidelying Right;Sidelying Left  Dix-Hallpike Right  Dix-Hallpike Right Duration constant  Dix-Hallpike Right Symptoms No nystagmus (extremely mild/subtle if present, may need re-testing )  Dix-Hallpike Left  Dix-Hallpike Left Duration constant   Dix-Hallpike Left Symptoms No nystagmus  Cognition  Cognition Orientation Level Oriented x 4  Orthostatics  BP supine (x 5 minutes) 141/71  BP sitting 84/58  BP standing (after 1 minute) 109/72  BP standing (after 3 minutes) 81/60  Orthostatics Comment ongoing positive orthostatics; tachypneic in standing and HR 114        Therapy Documentation Precautions:  Precautions Precautions: Fall, Other (comment) Precaution Comments: L  hemiparesis UE>LE, positive orthostatics- watch BP  Restrictions Weight Bearing Restrictions:  No General:   Pain: Pain Assessment Pain Scale: 0-10 Pain Score: 0-No pain    Therapy/Group: Individual Therapy  Deniece Ree PT, DPT, CBIS  Supplemental Physical Therapist [facility]    Pager [phone] Acute Rehab Office [phone]   01/20/2018, 11:33 AM

## 2018-01-20 NOTE — Progress Notes (Signed)
Speech Language Pathology Daily Session Note  Patient Details  Name: [name] MRN: [id number] Date of Birth: [birth year]  Today's Date: 01/20/2018 SLP Individual Time: 3474-2595 SLP Individual Time Calculation (min): 40 min  Short Term Goals: Week 1: SLP Short Term Goal 1 (Week 1): Patient will utilize speech intelligibility strategies at the sentence level with supervision verbal cues to achieve ~90% intelligibility.  SLP Short Term Goal 2 (Week 1): Patient will demonstrate functional problem solving for complex tasks with supervision verbal cues.  SLP Short Term Goal 3 (Week 1): Patient will recall new, daily information with supervision verbal and visual cues.  SLP Short Term Goal 4 (Week 1): Patient will demonstrate selective attention in a mildly distracting enviornment for 45 minutes with supervision verbal cues for redirection.   Skilled Therapeutic Interventions:Skilled treatment session focused on cognitive goals. SLP facilitated session by providing extra time and supervision verbal cues for problem solving during a basic money management task. Patient demonstrated selective attention to task for ~30 minutes with supervision verbal cues. Patient left upright in bed with alarm on and all needs within reach. Continue with current plan of care.      Pain No/Denies Pain   Therapy/Group: Individual Therapy  Sameria Morss 01/20/2018, 4:27 PM

## 2018-01-20 NOTE — Progress Notes (Signed)
Occupational Therapy Session Note  Patient Details  Name: [name] MRN: [id number] Date of Birth: 04-10-1950  Today's Date: 01/20/2018 OT Individual Time: 1015-1045 OT Individual Time Calculation (min): 30 min    Short Term Goals: Week 1:  OT Short Term Goal 1 (Week 1): Pt will be able to complete stand pivot to toilet with close S. OT Short Term Goal 2 (Week 1): Pt will be able to bathe with long handled sponge with CGA. OT Short Term Goal 3 (Week 1): Pt will don shirt with (with A to lift L arm) S. OT Short Term Goal 4 (Week 1): Pt will don pants with min A. OT Short Term Goal 5 (Week 1): Pt will use L arm as a stabilizer with mod cues.   Skilled Therapeutic Interventions/Progress Updates:    Session focused on hemodynamic stability during bed mobility and OOB mobility to ensure safety/fall prevention. Laying supine pt reported "feeling fine" with no c/o pain, BP 110/52. Pt transitioned to EOB with min A, manual facilitation for positioning of L UE. Pt immediately reported vision getting spotty and "feeling like he was going to pass out", BP was 80/52. Pt remained sitting with cueing for breathing and visual stabilization. 1 min later, BP rose to 109/82 and symptoms subsided for the most part. After 5 min of sitting, BP was 99/68, pt still a little dizzy. Pt stood briefly, requiring only min A to power up with hemi walker, immediately feeling nauseous and was assisted back to sitting EOB, BP taken immediately upon sitting- 98/73. Pt returned supine and left with bed alarm set and all needs met. Pt edu on BP s/s throughout session and impact on fall risk.   Therapy Documentation Precautions:  Precautions Precautions: Fall, Other (comment) Precaution Comments: L hemiparesis UE>LE, positive orthostatics- watch BP  Restrictions Weight Bearing Restrictions: No    Vital Signs: Oxygen Therapy SpO2: 93 % O2 Device: Room Air Pain: Pain Assessment Pain Scale: 0-10 Pain Score:  0-No pain   Therapy/Group: Individual Therapy  Curtis Sites 01/20/2018, 11:10 AM

## 2018-01-21 ENCOUNTER — Inpatient Hospital Stay (HOSPITAL_COMMUNITY): Payer: Self-pay | Admitting: Speech Pathology

## 2018-01-21 ENCOUNTER — Inpatient Hospital Stay (HOSPITAL_COMMUNITY): Payer: Self-pay

## 2018-01-21 ENCOUNTER — Inpatient Hospital Stay (HOSPITAL_COMMUNITY): Payer: Self-pay | Admitting: Physical Therapy

## 2018-01-21 NOTE — Progress Notes (Signed)
Occupational Therapy Session Note  Patient Details  Name: [name] MRN: [id number] Date of Birth: 1950-06-05  Today's Date: 01/21/2018 OT Individual Time: 0800-0900 OT Individual Time Calculation (min): 60 min    Short Term Goals: Week 1:  OT Short Term Goal 1 (Week 1): Pt will be able to complete stand pivot to toilet with close S. OT Short Term Goal 2 (Week 1): Pt will be able to bathe with long handled sponge with CGA. OT Short Term Goal 3 (Week 1): Pt will don shirt with (with A to lift L arm) S. OT Short Term Goal 4 (Week 1): Pt will don pants with min A. OT Short Term Goal 5 (Week 1): Pt will use L arm as a stabilizer with mod cues.   Skilled Therapeutic Interventions/Progress Updates:    OT intervention with focus on BADL retraining, sit<>stand, standing balance, functional tranfsers, activity tolerance, and safety awareness to increase independence with BADLs.  Pt with no s/s of dizziness/nausea throughout session (Ted hose and abd binder not donned until end of session). Pt performed supine>sit EOB with min A performed stand pivot transfer to w/c with steady A.  Pt completed bathing/dressing tasks per below in addition to toilet transfer and toileting.  Pt requires assistance with bathing RUE and donning clothing.  Pt stated that sometimes it took him 2 hours to put his shirt on and he would get frustrated and give up.  Will continue to educate with hemi dressing techniques/strategies. Ted hose and abd binder donned and pt remained in w/c with all needs within reach and belt alarm activated.   Therapy Documentation Precautions:  Precautions Precautions: Fall, Other (comment) Precaution Comments: L hemiparesis UE>LE, positive orthostatics- watch BP  Restrictions Weight Bearing Restrictions: No  Pain: Pain Assessment Pain Scale: 0-10 Pain Score: 0-No pain ADL: ADL Eating: Set up Where Assessed-Eating: Wheelchair Grooming: Setup Where Assessed-Grooming:  Wheelchair Upper Body Bathing: Minimal assistance Where Assessed-Upper Body Bathing: Sitting at sink, Wheelchair Lower Body Bathing: Minimal assistance Where Assessed-Lower Body Bathing: Wheelchair, Sitting at sink Upper Body Dressing: Minimal assistance Where Assessed-Upper Body Dressing: Wheelchair Lower Body Dressing: Moderate assistance Where Assessed-Lower Body Dressing: Wheelchair, Standing at sink Toileting: Contact guard Where Assessed-Toileting: Bedside Commode, Toilet Toilet Transfer: Minimal assistance Toilet Transfer Method: Arts development officer: Bedside commode, Energy manager: Minimal assistance Social research officer, government Method: Radiographer, therapeutic: Grab bars     Therapy/Group: Individual Therapy  Leroy Libman 01/21/2018, 10:13 AM

## 2018-01-21 NOTE — Progress Notes (Signed)
Speech Language Pathology Daily Session Note  Patient Details  Name: [name] MRN: [id number] Date of Birth: October 08, 1950  Today's Date: 01/21/2018 SLP Individual Time: 1005-1105 SLP Individual Time Calculation (min): 60 min  Short Term Goals: Week 1: SLP Short Term Goal 1 (Week 1): Patient will utilize speech intelligibility strategies at the sentence level with supervision verbal cues to achieve ~90% intelligibility.  SLP Short Term Goal 2 (Week 1): Patient will demonstrate functional problem solving for complex tasks with supervision verbal cues.  SLP Short Term Goal 3 (Week 1): Patient will recall new, daily information with supervision verbal and visual cues.  SLP Short Term Goal 4 (Week 1): Patient will demonstrate selective attention in a mildly distracting enviornment for 45 minutes with supervision verbal cues for redirection.   Skilled Therapeutic Interventions:   Skilled ST interventions included functional problem solving tasks and teaching/training in speech intelligibility strategies. Outcomes: Pt demonstrated difficulty comprehending meaning of "over-articulation" for speech intelligibility strategy. SLP simplified to "move your mouth more", which was placed on visual aid card for during of session and referenced during times of decreased intelligibility. During conversation and structured tasks, pt averaged 80-85% intelligible during conversation given written cues via developed visual aid. During structured problem solving task, pt verbalized problem and appropriate solution to safety issues in home environment with ~90% accuracy given min verbal cues. During additional structured task involving financial management, pt able to choose items from grocery store advertisement list and calculate change with ~70% accuracy. Pt pleasant and cooperative during today's session. Pt left upright in w/c in room with call bell and necessary items in reach. Continue POC.  Pain Pain  Assessment Pain Scale: 0-10 Pain Score: 0-No pain  Therapy/Group: Individual Therapy  Loni Beckwith, M.S. CCC-SLP Speech-Language Pathologist  Loni Beckwith 01/21/2018, 12:27 PM

## 2018-01-21 NOTE — Progress Notes (Signed)
Orthopedic Tech Progress Note Patient Details:  [name] Apr 28, 1950 [id number]  Ortho Devices Type of Ortho Device: Abdominal binder Ortho Device/Splint Location: delivered to room. gave it to physical therapy who was working with pt. Ortho Device/Splint Interventions: Renard Matter 01/21/2018, 8:35 AM

## 2018-01-21 NOTE — IPOC Note (Signed)
Overall Plan of Care [facility]) Patient Details Name: [name] MRN: [id number] DOB: November 02, 1950  Admitting Diagnosis: <principal problem not specified>  Hospital Problems: Active Problems:   Cerebral infarction due to embolism of right middle cerebral artery (HCC)   Type 2 diabetes mellitus with peripheral neuropathy ([place])   Right middle cerebral artery stroke ([place])     Functional Problem List: Nursing Endurance, Medication Management, Motor, Safety, Skin Integrity, Sensory  PT Balance, Safety, Endurance, Motor  OT Balance, Endurance, Motor  SLP Cognition  TR         Basic ADL's: OT Bathing, Dressing, Toileting     Advanced  ADL's: OT       Transfers: PT Bed Mobility, Bed to Chair, Car, [employer], Futures trader, Metallurgist: PT Ambulation, Emergency planning/management officer, Stairs     Additional Impairments: OT Fuctional Use of Upper Extremity  SLP Social Cognition, Communication expression Problem Solving, Memory  TR      Anticipated Outcomes Item Anticipated Outcome  Self Feeding Independent  Swallowing      Basic self-care  S with UB self care, min A LB self care  Toileting  toileting   Bathroom Transfers mod I to toilet, S to tub   Bowel/Bladder  Pt will manage bowel and bladder with mod I assist   Transfers  24/7 S hemi-walker   Locomotion  24/7 S hemi-walker   Communication  Mod I  Cognition  Mod I   Pain  Pt will manage pain at 3 or less on a scale of 0-10.   Safety/Judgment  Pt will remain free of falls and injury with min assist while in rehab.    Therapy Plan: PT Intensity: Minimum of 1-2 x/day ,45 to 90 minutes PT Frequency: 5 out of 7 days PT Duration Estimated Length of Stay: 2 weeks  OT Intensity: Minimum of 1-2 x/day, 45 to 90 minutes OT Frequency: 5 out of 7 days OT Duration/Estimated Length of Stay: 12-14 days SLP Intensity: Minumum of 1-2 x/day, 30 to 90 minutes SLP Frequency: 3 to 5 out of 7 days SLP  Duration/Estimated Length of Stay: 12-14 days     Team Interventions: Nursing Interventions Patient/Family Education, Medication Management, Disease Management/Prevention, Skin Care/Wound Management, Discharge Planning, Psychosocial Support  PT interventions Ambulation/gait training, Community reintegration, DME/adaptive equipment instruction, Neuromuscular re-education, Psychosocial support, Stair training, UE/LE Strength taining/ROM, Wheelchair propulsion/positioning, Training and development officer, Discharge planning, Functional electrical stimulation, Pain management, Skin care/wound management, Therapeutic Activities, UE/LE Coordination activities, Cognitive remediation/compensation, Disease management/prevention, Functional mobility training, Patient/family education, Splinting/orthotics, Therapeutic Exercise, Visual/perceptual remediation/compensation  OT Interventions Balance/vestibular training, Functional electrical stimulation, Neuromuscular re-education, Patient/family education, Self Care/advanced ADL retraining, Therapeutic Exercise, UE/LE Coordination activities, UE/LE Strength taining/ROM, Therapeutic Activities, Psychosocial support, Functional mobility training, DME/adaptive equipment instruction, Discharge planning  SLP Interventions Cognitive remediation/compensation, Speech/Language facilitation, Cueing hierarchy, Environmental controls, Therapeutic Activities, Functional tasks, Patient/family education, Internal/external aids  TR Interventions    SW/CM Interventions Discharge Planning, Psychosocial Support, Patient/Family Education   Barriers to Discharge MD  Medical stability, Lack of/limited family support, Medication compliance and substance abuse  Nursing      PT Other (comments), Lack of/limited family support safety concerns- multiple falls at home when aide is not with him on weekends, no family to assist   OT Decreased caregiver support, Lack of/limited family  support history of falls  SLP      SW Decreased caregiver support, Medication compliance Does not have 24 hr care and at times is non-compliant  with his medications   Team Discharge Planning: Destination: PT-Skilled Nursing Facility (SNF) ,OT- Home , SLP-Home Projected Follow-up: PT-Skilled nursing facility, 24 hour supervision/assistance, OT-  Skilled nursing facility, SLP-24 hour supervision/assistance, Home Health SLP Projected Equipment Needs: PT-3 in 1 bedside comode, Tub/shower seat, Wheelchair (measurements), Wheelchair cushion (measurements), Other (comment)(hemi-walker ), OT- Tub/shower bench, SLP-None recommended by SLP Equipment Details: PT- , OT-  Patient/family involved in discharge planning: PT- Patient,  OT-Patient, SLP-Patient  MD ELOS: 10-14d Medical Rehab Prognosis:  Good Assessment:  67 year old right-handed male with history of diabetes mellitus,seizure disorder maintained on Keppra CAD, polysubstance and alcohol abuse, diastolic congestive heart failure, COPD, chronically occluded right ICA, recent CVA with left-sided residual weakness maintained on aspirin and Plavix and was discharged to [facility] facility for 3 weeks before returning home. Per chart review patient lives at [facility] senior apartments with assistance from a home health aide. He was using a walker while at home with noted falls.Presented 01/13/2018 with increasing weakness 2-3 days.UDS positive cocaine. MRI showed multifocal acute small right frontal MCA territory infarctions. Old right MCA territory infarcts. Cerebral angiogram again confirmed occluded right ICA stent without evidence of string sign and no plan for further intervention. Echocardiogram with ejection fraction of 55-60% without emboli.Chest x-ray with incidental finding of right upper lobe nodule plan follow-up outpatient. Neurology follow-up currently maintained on aspirin and Plavix for CVA prophylaxis. Subcutaneous  Lovenox for DVT prophylaxis   Now requiring 24/7 Rehab RN,MD, as well as CIR level PT, OT and SLP.  Treatment team will focus on ADLs and mobility with goals set at Mod I /Sup See Team Conference Notes for weekly updates to the plan of care

## 2018-01-21 NOTE — Progress Notes (Signed)
[facility] PHYSICAL MEDICINE & REHABILITATION PROGRESS NOTE   Subjective/Complaints:  Slept better last noc, Ambien 5mg   ROS- + cough difficult to cough up sputum, no SOB, No CP or abd pain , no N/V/D   Objective:   No results found. Recent Labs    01/19/18 0655  WBC 4.8  HGB 12.2*  HCT 41.3  PLT 176   Recent Labs    01/19/18 0655  NA 145  K 4.6  CL 108  CO2 29  GLUCOSE 106*  BUN 18  CREATININE 0.74  CALCIUM 9.6    Intake/Output Summary (Last 24 hours) at 01/21/2018 1645 Last data filed at 01/21/2018 1227 Gross per 24 hour  Intake 840 ml  Output 1250 ml  Net -410 ml     Physical Exam: Vital Signs Blood pressure 115/64, pulse 77, temperature 97.6 F (36.4 C), resp. rate 18, height 6' (1.829 m), weight 102.4 kg, SpO2 94 %.  General: No acute distress Mood and affect are appropriate Heart: Regular rate and rhythm no rubs murmurs or extra sounds Lungs: Clear to auscultation, breathing unlabored, no rales or wheezes Abdomen: Positive bowel sounds, soft nontender to palpation, nondistended Extremities: No clubbing, cyanosis, or edema Skin: No evidence of breakdown, no evidence of rash Neurologic: Cranial nerves II through XII intact, motor strength is 5/5 in right  deltoid, bicep, tricep, grip, hip flexor, knee extensors, ankle dorsiflexor and plantar flexor 2- LUE, 4- LLE Sensory exam reduced sensation to light touch and proprioception in bilateral   lower extremities and LUE  Cerebellar exam normal finger to nose to finger as well as heel to shin in bilateral upper and lower extremities Musculoskeletal: Full range of motion in all 4 extremities. No joint swelling   Assessment/Plan: 1. Functional deficits secondary to RIght MCA infarct  which require 3+ hours per day of interdisciplinary therapy in a comprehensive inpatient rehab setting.  Physiatrist is providing close team supervision and 24 hour management of active medical problems listed  below.  Physiatrist and rehab team continue to assess barriers to discharge/monitor patient progress toward functional and medical goals  Care Tool:  Bathing    Body parts bathed by patient: Left arm, Chest, Abdomen, Front perineal area, Buttocks, Right upper leg, Left upper leg, Face   Body parts bathed by helper: Right lower leg, Left lower leg     Bathing assist Assist Level: Minimal Assistance - Patient > 75%     Upper Body Dressing/Undressing Upper body dressing   What is the patient wearing?: Pull over shirt    Upper body assist Assist Level: Minimal Assistance - Patient > 75%    Lower Body Dressing/Undressing Lower body dressing      What is the patient wearing?: Pants     Lower body assist Assist for lower body dressing: Moderate Assistance - Patient 50 - 74%     Toileting Toileting    Toileting assist Assist for toileting: Contact Guard/Touching assist     Transfers Chair/bed transfer  Transfers assist     Chair/bed transfer assist level: Contact Guard/Touching assist     Locomotion Ambulation   Ambulation assist      Assist level: Contact Guard/Touching assist Assistive device: Cane-straight Max distance: 100'   Walk 10 feet activity   Assist     Assist level: Contact Guard/Touching assist Assistive device: Cane-straight   Walk 50 feet activity   Assist    Assist level: Contact Guard/Touching assist Assistive device: Cane-straight    Walk 150 feet activity  Assist Walk 150 feet activity did not occur: Safety/medical concerns(medical- felt like he was going to pass out )         Walk 10 feet on uneven surface  activity   Assist Walk 10 feet on uneven surfaces activity did not occur: Safety/medical concerns(medical- felt like he was going to pass out )         Wheelchair     Assist Will patient use wheelchair at discharge?: Yes Type of Wheelchair: Manual    Wheelchair assist level: Supervision/Verbal  cueing Max wheelchair distance: 98ft     Wheelchair 50 feet with 2 turns activity    Assist        Assist Level: Supervision/Verbal cueing   Wheelchair 150 feet activity     Assist Wheelchair 150 feet activity did not occur: Safety/medical concerns(fatigue )        Medical Problem List and Plan: 1.Left hemiparesissecondary to Right MCA small embolic infarcts due to chronic right ICA occlusion in the setting of cocaine abuse and suspect medical noncompliance CIR PT OT speech 2. DVT Prophylaxis/Anticoagulation: Subcutaneous Lovenox. Monitor for any bleeding episodes 3. Pain Management:Lyrica 150 mg twice a day 4. Mood:Namenda 10 mg twice a day,Cymbalta 60 mg daily, Atarax as needed for anxiety 5. Neuropsych: This patientiscapable of making decisions on hisown behalf.  6. Skin/Wound Care:Routine skin checks 7. Fluids/Electrolytes/Nutrition:Routine and out's with follow-up chemistriesupon admit. 8. Seizure disorder. Keppra 500 mg twice a day 9. History of polysubstance abuse / abuse tobacco abuse. UDS positive for cocaine. Provide counseling. Continue NicoDerm patch 10. Diabetes mellitus with peripheral neuropathy.Hemoglobin A1c 5.4. SSI. Check blood sugars before meals and at bedtime CBG (last 3)  Recent Labs    01/18/18 2116 01/19/18 0619  GLUCAP 111* 92  normal will d/c CBG and SSI 11. Diastolic congestive heart failure. Monitor for any signs of fluid overload -check daily weights Vitals:   01/21/18 0522 01/21/18 1409  BP: (!) 116/57 115/64  Pulse: 68 77  Resp: 16 18  Temp: 97.8 F (36.6 C) 97.6 F (36.4 C)  SpO2: 93% 94%  Blood pressure controlled 12/13.  On beta-blocker for CHF 12. Hyperlipidemia. Lipitor 13. History of gout. Zyloprim 300 mg daily 14. Constipation. Laxative assistance  15.  Insomnia improved on Ambien 5 mg, monitor for confusion  LOS: 3 days A FACE TO FACE EVALUATION WAS PERFORMED  Charlett Blake 01/21/2018, 4:45 PM

## 2018-01-21 NOTE — Progress Notes (Signed)
Physical Therapy Session Note  Patient Details  Name: [name] MRN: [id number] Date of Birth: [birth year]  Today's Date: 01/21/2018 PT Individual Time: 1435-1530 PT Individual Time Calculation (min): 55 min   Short Term Goals: Week 1:  PT Short Term Goal 1 (Week 1): Patient to be able to perform all bed mobility with S  PT Short Term Goal 2 (Week 1): Patient to perform all functional bed to chair transfers with min guard  PT Short Term Goal 3 (Week 1): Patient to be able to ambulate at least 153ft with LRAD and min guard  PT Short Term Goal 4 (Week 1): Patient to be able to ascend/descent single step curb with LRAD and MinA   Skilled Therapeutic Interventions/Progress Updates:   Pt in supine, getting dressed w/ nursing. Denies pain and agreeable to therapy. Session focused on OOB tolerance, functional endurance, and gait training. Abdominal binder and thigh-high TED hose already donned prior to session. Transferred to EOB w/ min assist and donned LE garments w/ min assist. Stand pivot transfer to w/c. Ambulated 86' x2 and 100' w/ either quad cane or SPC and contact guard. Pt safe w/ both and did not need any cues for gait pattern w/ cane. Needed 2nd person to bring chair as pt c/o feeling lightheaded after 3rd gait bout. Symptoms resolved w/ a brief seated rest break. Attempted w/c mobility, however pt unable to reach floor w/ feet to self-propel. NuStep 10 min @ level 3 for LE strengthening in reciprocal movement pattern and endurance training. Tactile and verbal cues for neutral LE alignment. Returned to room and ended session in w/c, all needs in reach.   Therapy Documentation Precautions:  Precautions Precautions: Fall, Other (comment) Precaution Comments: L hemiparesis UE>LE, positive orthostatics- watch BP  Restrictions Weight Bearing Restrictions: No Vital Signs: Therapy Vitals Temp: 97.6 F (36.4 C) Pulse Rate: 77 Resp: 18 BP: 115/64 Patient Position (if appropriate):  Lying Oxygen Therapy SpO2: 94 % O2 Device: Room Air Pain: Pain Assessment Pain Score: 0-No pain  Therapy/Group: Individual Therapy  Quadry Kampa Clent Demark 01/21/2018, 3:41 PM

## 2018-01-22 ENCOUNTER — Inpatient Hospital Stay (HOSPITAL_COMMUNITY): Payer: Self-pay | Admitting: Physical Therapy

## 2018-01-22 ENCOUNTER — Inpatient Hospital Stay (HOSPITAL_COMMUNITY): Payer: Self-pay

## 2018-01-22 ENCOUNTER — Inpatient Hospital Stay (HOSPITAL_COMMUNITY): Payer: Self-pay | Admitting: Occupational Therapy

## 2018-01-22 MED ORDER — METHOCARBAMOL 500 MG PO TABS
500.0000 mg | ORAL_TABLET | Freq: Four times a day (QID) | ORAL | Status: DC | PRN
  Administered 2018-01-22 – 2018-02-01 (×6): 500 mg via ORAL
  Filled 2018-01-22 (×6): qty 1

## 2018-01-22 NOTE — Progress Notes (Signed)
Physical Therapy Session Note  Patient Details  Name: [name] MRN: [id number] Date of Birth: 12-15-1950  Today's Date: 01/22/2018 PT Individual Time: 1045-1110 AND 1415-1430 PT Individual Time Calculation (min): 25 min AND 15 min  Short Term Goals: Week 1:  PT Short Term Goal 1 (Week 1): Patient to be able to perform all bed mobility with S  PT Short Term Goal 2 (Week 1): Patient to perform all functional bed to chair transfers with min guard  PT Short Term Goal 3 (Week 1): Patient to be able to ambulate at least 149ft with LRAD and min guard  PT Short Term Goal 4 (Week 1): Patient to be able to ascend/descent single step curb with LRAD and MinA   Skilled Therapeutic Interventions/Progress Updates:   Session 1:  Pt in w/c and agreeable to therapy, denies pain but reports feeling dizzy today. Total assist w/c transport to/from gym. BP 109/68 in seated, lightheaded just in seated today w/ both thigh high TEDs and abdominal binder donned. Stood w/ min assist, BP 101/73 w/ increased dizziness and pt reported feeling "swimmy-headed". Returned to room and transferred to supine, ended session resting in supine, all needs in reach. Missed 50 min of skilled PT 2/2 low BP and symptomatic orthostasis. RN made aware.   Session 2:  Pt in supine and agreeable to therapy but reports ongoing dizziness since last left in supine by this therapist. Thigh high TEDs and abdominal binder already donned. BP 100/78 and pt symptomatic in supine. Agreeable to attempt OOB activity. Transferred to EOB w/ min assist, BP 86/55 and pt w/ increased c/o lightheadedness. Deferred continuing session 2/2 low BP and symptomatic orthostasis, missed 45 min of skilled PT. RN made aware. Ended session in supine, all needs in reach.   Therapy Documentation Precautions:  Precautions Precautions: Fall, Other (comment) Precaution Comments: L hemiparesis UE>LE, positive orthostatics- watch BP  Restrictions Weight Bearing  Restrictions: No Vital Signs: Therapy Vitals Pulse Rate: 83 BP: 101/73 Patient Position (if appropriate): Standing Oxygen Therapy SpO2: 92 % O2 Device: Room Air  Therapy/Group: Individual Therapy  Zandra Lajeunesse K Sandy Blouch 01/22/2018, 11:15 AM

## 2018-01-22 NOTE — Progress Notes (Signed)
Occupational Therapy Session Note  Patient Details  Name: [name] MRN: [id number] Date of Birth: Jun 20, 1950  Today's Date: 01/22/2018 OT Individual Time: 0820-0930 OT Individual Time Calculation (min): 70 min    Short Term Goals: Week 1:  OT Short Term Goal 1 (Week 1): Pt will be able to complete stand pivot to toilet with close S. OT Short Term Goal 2 (Week 1): Pt will be able to bathe with long handled sponge with CGA. OT Short Term Goal 3 (Week 1): Pt will don shirt with (with A to lift L arm) S. OT Short Term Goal 4 (Week 1): Pt will don pants with min A. OT Short Term Goal 5 (Week 1): Pt will use L arm as a stabilizer with mod cues.   Skilled Therapeutic Interventions/Progress Updates:    1;1. Pt greeted in bed. Monitoring of vitals througout session as follows: Supine131/59 EOB 109/54 EOB with binder 108/75  Pt supine>sitting min A for trunk elevation. Pt bathes seated EOB with A to wash RUE with LUE for NMR and back as pt symptomatic with orthostasis. Pt dons shirt with min A for threading LUE. OT dons abdominal binder. Pt washes LB with steady A when standing to wash buttock. Pt with many skin tears on buttock d/t itching. RN aware. Pt washes BLE with crossing into figure 4 to reach feet. Pt threads BLE into pants after OT dons teds and non skid socks. Pt stand pivot transfer with min A and VC for controlled decent.   In tx gym pt crosses midine with min A for L lean to obtain horse shoe from knee height and hook onto basketball hoop for dynamic standing balacne. Exited sessionw iht pt seated in w/c velt alarm on and call light in reach  Therapy Documentation Precautions:  Precautions Precautions: Fall, Other (comment) Precaution Comments: L hemiparesis UE>LE, positive orthostatics- watch BP  Restrictions Weight Bearing Restrictions: No General:   Vital Signs: Therapy Vitals Pulse Rate: 83 BP: (!) 146/55 Oxygen Therapy SpO2: 92 % O2 Device: Room  Air Pain:   ADL: ADL Eating: Set up Where Assessed-Eating: Wheelchair Grooming: Setup Where Assessed-Grooming: Wheelchair Upper Body Bathing: Minimal assistance Where Assessed-Upper Body Bathing: Sitting at sink, Wheelchair Lower Body Bathing: Minimal assistance Where Assessed-Lower Body Bathing: Wheelchair, Sitting at sink Upper Body Dressing: Minimal assistance Where Assessed-Upper Body Dressing: Wheelchair Lower Body Dressing: Moderate assistance Where Assessed-Lower Body Dressing: Wheelchair, Standing at sink Toileting: Contact guard Where Assessed-Toileting: Bedside Commode, Control and instrumentation engineer Transfer: Minimal assistance Toilet Transfer Method: Arts development officer: Bedside commode, Energy manager: Minimal assistance Social research officer, government Method: Radiographer, therapeutic: Landscape architect   Exercises:   Other Treatments:     Therapy/Group: Individual Therapy  Tonny Branch 01/22/2018, 9:31 AM

## 2018-01-22 NOTE — Progress Notes (Signed)
[place] PHYSICAL MEDICINE & REHABILITATION PROGRESS NOTE   Subjective/Complaints:  No complaints this morning when I rounded but RN contacted me later that patient is having muscle spasms in legs  ROS: Patient denies fever, rash, sore throat, blurred vision, nausea, vomiting, diarrhea, cough, shortness of breath or chest pain,  headache, or mood change.    Objective:   No results found. No results for input(s): WBC, HGB, HCT, PLT in the last 72 hours. No results for input(s): NA, K, CL, CO2, GLUCOSE, BUN, CREATININE, CALCIUM in the last 72 hours.  Intake/Output Summary (Last 24 hours) at 01/22/2018 1245 Last data filed at 01/22/2018 0813 Gross per 24 hour  Intake 600 ml  Output 1050 ml  Net -450 ml     Physical Exam: Vital Signs Blood pressure 101/73, pulse 83, temperature 97.8 F (36.6 C), resp. rate 20, height 6' (1.829 m), weight 102.4 kg, SpO2 92 %.  Constitutional: No distress . Vital signs reviewed. HEENT: EOMI, oral membranes moist Neck: supple Cardiovascular: RRR without murmur. No JVD    Respiratory: CTA Bilaterally without wheezes or rales. Normal effort    GI: BS +, non-tender, non-distended  Extremities: No clubbing, cyanosis, or edema Skin: No evidence of breakdown, no evidence of rash Neurologic: Cranial nerves II through XII intact, motor strength is 5/5 in right  deltoid, bicep, tricep, grip, hip flexor, knee extensors, ankle dorsiflexor and plantar flexor 2 to 2-/5 LUE, 4-/5 LLE Sensory exam reduced sensation to light touch and proprioception in bilateral   lower extremities and LUE --no change   Musculoskeletal: Full range of motion in all 4 extremities. No joint swelling   Assessment/Plan: 1. Functional deficits secondary to RIght MCA infarct  which require 3+ hours per day of interdisciplinary therapy in a comprehensive inpatient rehab setting.  Physiatrist is providing close team supervision and 24 hour management of active medical problems  listed below.  Physiatrist and rehab team continue to assess barriers to discharge/monitor patient progress toward functional and medical goals  Care Tool:  Bathing    Body parts bathed by patient: Left arm, Chest, Abdomen, Front perineal area, Buttocks, Right upper leg, Left upper leg, Face   Body parts bathed by helper: Right lower leg, Left lower leg     Bathing assist Assist Level: Minimal Assistance - Patient > 75%     Upper Body Dressing/Undressing Upper body dressing   What is the patient wearing?: Pull over shirt    Upper body assist Assist Level: Minimal Assistance - Patient > 75%    Lower Body Dressing/Undressing Lower body dressing      What is the patient wearing?: Pants     Lower body assist Assist for lower body dressing: Moderate Assistance - Patient 50 - 74%     Toileting Toileting    Toileting assist Assist for toileting: Contact Guard/Touching assist     Transfers Chair/bed transfer  Transfers assist     Chair/bed transfer assist level: Contact Guard/Touching assist     Locomotion Ambulation   Ambulation assist      Assist level: Contact Guard/Touching assist Assistive device: Cane-straight Max distance: 100'   Walk 10 feet activity   Assist     Assist level: Contact Guard/Touching assist Assistive device: Cane-straight   Walk 50 feet activity   Assist    Assist level: Contact Guard/Touching assist Assistive device: Cane-straight    Walk 150 feet activity   Assist Walk 150 feet activity did not occur: Safety/medical concerns(medical- felt like he was  going to pass out )         Walk 10 feet on uneven surface  activity   Assist Walk 10 feet on uneven surfaces activity did not occur: Safety/medical concerns(medical- felt like he was going to pass out )         Wheelchair     Assist Will patient use wheelchair at discharge?: Yes Type of Wheelchair: Manual    Wheelchair assist level:  Supervision/Verbal cueing Max wheelchair distance: 64ft     Wheelchair 50 feet with 2 turns activity    Assist        Assist Level: Supervision/Verbal cueing   Wheelchair 150 feet activity     Assist Wheelchair 150 feet activity did not occur: Safety/medical concerns(fatigue )        Medical Problem List and Plan: 1.Left hemiparesissecondary to Right MCA small embolic infarcts due to chronic right ICA occlusion in the setting of cocaine abuse and suspect medical noncompliance   -Continue CIR therapies including PT, OT , SLP 2. DVT Prophylaxis/Anticoagulation: Subcutaneous Lovenox. Monitor for any bleeding episodes 3. Pain Management:Lyrica 150 mg twice a day  -robaxin prn for leg spasms 4. Mood:Namenda 10 mg twice a day,Cymbalta 60 mg daily, Atarax as needed for anxiety 5. Neuropsych: This patientiscapable of making decisions on hisown behalf.  6. Skin/Wound Care:Routine skin checks 7. Fluids/Electrolytes/Nutrition:Routine and out's with follow-up chemistriesupon admit. 8. Seizure disorder. Keppra 500 mg twice a day 9. History of polysubstance abuse / abuse tobacco abuse. UDS positive for cocaine. Provide counseling. Continue NicoDerm patch 10. Diabetes mellitus with peripheral neuropathy.  CBG (last 3)  No results for input(s): GLUCAP in the last 72 hours.  d/c'ed CBG and SSI 11. Diastolic congestive heart failure. Monitor for any signs of fluid overload -need to check daily weights  - [facility] Weights   01/18/18 1519 01/19/18 0349  Weight: 101.6 kg 102.4 kg    Vitals:   01/22/18 1053 01/22/18 1057  BP: 109/68 101/73  Pulse:    Resp:    Temp:    SpO2:    Blood pressure controlled 12/13.  On beta-blocker for CHF  12. Hyperlipidemia. Lipitor 13. History of gout. Zyloprim 300 mg daily 14. Constipation. Laxative assistance  15.  Insomnia improved on Ambien 5 mg, monitor for confusion  LOS: 4 days A FACE TO FACE EVALUATION WAS  PERFORMED  [name] 01/22/2018, 12:45 PM

## 2018-01-23 NOTE — Progress Notes (Signed)
[place] PHYSICAL MEDICINE & REHABILITATION PROGRESS NOTE   Subjective/Complaints:  Had a good night. Robaxin helped leg spasms. No new complaints today  ROS: Patient denies fever, rash, sore throat, blurred vision, nausea, vomiting, diarrhea, cough, shortness of breath or chest pain, joint or back pain, headache, or mood change. .    Objective:   No results found. No results for input(s): WBC, HGB, HCT, PLT in the last 72 hours. No results for input(s): NA, K, CL, CO2, GLUCOSE, BUN, CREATININE, CALCIUM in the last 72 hours.  Intake/Output Summary (Last 24 hours) at 01/23/2018 1023 Last data filed at 01/23/2018 0846 Gross per 24 hour  Intake 1080 ml  Output 1650 ml  Net -570 ml     Physical Exam: Vital Signs Blood pressure 140/64, pulse 69, temperature 98.1 F (36.7 C), temperature source Oral, resp. rate 18, height 6' (1.829 m), weight 102.4 kg, SpO2 93 %.  Constitutional: No distress . Vital signs reviewed. HEENT: EOMI, oral membranes moist Neck: supple Cardiovascular: RRR without murmur. No JVD    Respiratory: CTA Bilaterally without wheezes or rales. Normal effort    GI: BS +, non-tender, non-distended  Extremities: No clubbing, cyanosis, or edema Skin: No evidence of breakdown, no evidence of rash Neurologic: Cranial nerves II through XII intact, motor strength is 5/5 in right  deltoid, bicep, tricep, grip, hip flexor, knee extensors, ankle dorsiflexor and plantar flexor 2 to 2-/5 LUE, 4-/5 LLE---stable Sensory exam reduced sensation to light touch and proprioception in bilateral   lower extremities and LUE --stable   Musculoskeletal: Full range of motion in all 4 extremities. No joint swelling   Assessment/Plan: 1. Functional deficits secondary to RIght MCA infarct  which require 3+ hours per day of interdisciplinary therapy in a comprehensive inpatient rehab setting.  Physiatrist is providing close team supervision and 24 hour management of active medical  problems listed below.  Physiatrist and rehab team continue to assess barriers to discharge/monitor patient progress toward functional and medical goals  Care Tool:  Bathing    Body parts bathed by patient: Left arm, Chest, Abdomen, Front perineal area, Buttocks, Right upper leg, Left upper leg, Face   Body parts bathed by helper: Right lower leg, Left lower leg     Bathing assist Assist Level: Minimal Assistance - Patient > 75%     Upper Body Dressing/Undressing Upper body dressing   What is the patient wearing?: Pull over shirt    Upper body assist Assist Level: Minimal Assistance - Patient > 75%    Lower Body Dressing/Undressing Lower body dressing      What is the patient wearing?: Pants     Lower body assist Assist for lower body dressing: Moderate Assistance - Patient 50 - 74%     Toileting Toileting    Toileting assist Assist for toileting: Contact Guard/Touching assist     Transfers Chair/bed transfer  Transfers assist     Chair/bed transfer assist level: Contact Guard/Touching assist     Locomotion Ambulation   Ambulation assist      Assist level: Contact Guard/Touching assist Assistive device: Cane-straight Max distance: 100'   Walk 10 feet activity   Assist     Assist level: Contact Guard/Touching assist Assistive device: Cane-straight   Walk 50 feet activity   Assist    Assist level: Contact Guard/Touching assist Assistive device: Cane-straight    Walk 150 feet activity   Assist Walk 150 feet activity did not occur: Safety/medical concerns(medical- felt like he was going to  pass out )         Walk 10 feet on uneven surface  activity   Assist Walk 10 feet on uneven surfaces activity did not occur: Safety/medical concerns(medical- felt like he was going to pass out )         Wheelchair     Assist Will patient use wheelchair at discharge?: Yes Type of Wheelchair: Manual    Wheelchair assist level:  Supervision/Verbal cueing Max wheelchair distance: 77ft     Wheelchair 50 feet with 2 turns activity    Assist        Assist Level: Supervision/Verbal cueing   Wheelchair 150 feet activity     Assist Wheelchair 150 feet activity did not occur: Safety/medical concerns(fatigue )        Medical Problem List and Plan: 1.Left hemiparesissecondary to Right MCA small embolic infarcts due to chronic right ICA occlusion in the setting of cocaine abuse and suspect medical noncompliance   -Continue CIR therapies including PT, OT , SLP 2. DVT Prophylaxis/Anticoagulation: Subcutaneous Lovenox. Monitor for any bleeding episodes 3. Pain Management:Lyrica 150 mg twice a day  -robaxin prn for leg spasms 4. Mood:Namenda 10 mg twice a day,Cymbalta 60 mg daily, Atarax as needed for anxiety 5. Neuropsych: This patientiscapable of making decisions on hisown behalf.  6. Skin/Wound Care:Routine skin checks 7. Fluids/Electrolytes/Nutrition:Routine and out's with follow-up chemistriesupon admit. 8. Seizure disorder. Keppra 500 mg twice a day 9. History of polysubstance abuse / abuse tobacco abuse. UDS positive for cocaine. Provide counseling. Continue NicoDerm patch 10. Diabetes mellitus with peripheral neuropathy.  CBG (last 3)  No results for input(s): GLUCAP in the last 72 hours.  d/c'ed CBG and SSI 11. Diastolic congestive heart failure. Monitor for any signs of fluid overload -need to check daily weights---will order again  - [facility] Weights   01/18/18 1519 01/19/18 0349  Weight: 101.6 kg 102.4 kg    Vitals:   01/23/18 0835 01/23/18 0900  BP: 140/64   Pulse: 69   Resp:    Temp:    SpO2:  93%  Blood pressure controlled 12/15.  On beta-blocker for CHF  12. Hyperlipidemia. Lipitor 13. History of gout. Zyloprim 300 mg daily 14. Constipation. Laxative assistance  15.  Insomnia improved on Ambien 5 mg, monitor for confusion  LOS: 5 days A FACE TO  FACE EVALUATION WAS PERFORMED  [name] 01/23/2018, 10:23 AM

## 2018-01-24 ENCOUNTER — Inpatient Hospital Stay (HOSPITAL_COMMUNITY): Payer: Self-pay

## 2018-01-24 ENCOUNTER — Inpatient Hospital Stay (HOSPITAL_COMMUNITY): Payer: Self-pay | Admitting: Speech Pathology

## 2018-01-24 DIAGNOSIS — I5032 Chronic diastolic (congestive) heart failure: Secondary | ICD-10-CM

## 2018-01-24 DIAGNOSIS — I63511 Cerebral infarction due to unspecified occlusion or stenosis of right middle cerebral artery: Secondary | ICD-10-CM

## 2018-01-24 DIAGNOSIS — E669 Obesity, unspecified: Secondary | ICD-10-CM

## 2018-01-24 DIAGNOSIS — R569 Unspecified convulsions: Secondary | ICD-10-CM

## 2018-01-24 DIAGNOSIS — R0989 Other specified symptoms and signs involving the circulatory and respiratory systems: Secondary | ICD-10-CM

## 2018-01-24 DIAGNOSIS — E1169 Type 2 diabetes mellitus with other specified complication: Secondary | ICD-10-CM

## 2018-01-24 DIAGNOSIS — L299 Pruritus, unspecified: Secondary | ICD-10-CM

## 2018-01-24 DIAGNOSIS — E8809 Other disorders of plasma-protein metabolism, not elsewhere classified: Secondary | ICD-10-CM

## 2018-01-24 DIAGNOSIS — E46 Unspecified protein-calorie malnutrition: Secondary | ICD-10-CM

## 2018-01-24 MED ORDER — PRO-STAT SUGAR FREE PO LIQD
30.0000 mL | Freq: Two times a day (BID) | ORAL | Status: DC
  Administered 2018-01-24 – 2018-02-04 (×11): 30 mL via ORAL
  Filled 2018-01-24 (×19): qty 30

## 2018-01-24 MED ORDER — HYDROXYZINE HCL 25 MG PO TABS
25.0000 mg | ORAL_TABLET | Freq: Three times a day (TID) | ORAL | Status: DC
  Administered 2018-01-24 – 2018-01-25 (×3): 25 mg via ORAL
  Filled 2018-01-24 (×3): qty 1

## 2018-01-24 NOTE — Progress Notes (Signed)
Speech Language Pathology Daily Session Note  Patient Details  Name: [name] MRN: [id number] Date of Birth: 12-20-1950  Today's Date: 01/24/2018 SLP Individual Time: 3007-6226 SLP Individual Time Calculation (min): 55 min  Short Term Goals: Week 1: SLP Short Term Goal 1 (Week 1): Patient will utilize speech intelligibility strategies at the sentence level with supervision verbal cues to achieve ~90% intelligibility.  SLP Short Term Goal 2 (Week 1): Patient will demonstrate functional problem solving for complex tasks with supervision verbal cues.  SLP Short Term Goal 3 (Week 1): Patient will recall new, daily information with supervision verbal and visual cues.  SLP Short Term Goal 4 (Week 1): Patient will demonstrate selective attention in a mildly distracting enviornment for 45 minutes with supervision verbal cues for redirection.   Skilled Therapeutic Interventions: Skilled treatment session focused on cognitive and communication goals. Patient recalled events from previous therapy sessions with Mod I but reported anxiety over current living situation and potential discharge plan. Patient participated in a functional conversation that focused on options to maximize independence as well as safety at discharge. Throughout conversation, patient required supervision verbal cues to achieve ~90% intelligible at the sentence level. Patient's intelligibility appeared most improved when patient utilized a slow rate of speech. Patient left upright in bed with alarm on and all needs within reach. Continue with current plan of care.      Pain Pain Assessment Pain Scale: 0-10 Pain Score: 0-No pain  Therapy/Group: Individual Therapy  Loriann Bosserman 01/24/2018, 3:00 PM

## 2018-01-24 NOTE — Progress Notes (Signed)
[place] PHYSICAL MEDICINE & REHABILITATION PROGRESS NOTE   Subjective/Complaints: Patient seen working with OT this morning.  He states he slept fairly overnight.  He states he had a fair weekend due to pruritus.  ROS: + Pruritus.  Denies CP, SOB, N/V/D  Objective:   No results found. No results for input(s): WBC, HGB, HCT, PLT in the last 72 hours. No results for input(s): NA, K, CL, CO2, GLUCOSE, BUN, CREATININE, CALCIUM in the last 72 hours.  Intake/Output Summary (Last 24 hours) at 01/24/2018 1244 Last data filed at 01/24/2018 0823 Gross per 24 hour  Intake 1160 ml  Output 1825 ml  Net -665 ml     Physical Exam: Vital Signs Blood pressure (!) 158/74, pulse 71, temperature 98.5 F (36.9 C), resp. rate 18, height 6' (1.829 m), weight 102.4 kg, SpO2 97 %.  Constitutional: No distress . Vital signs reviewed. HENT: Normocephalic.  Atraumatic. Eyes: EOMI. No discharge. Cardiovascular: RRR. No JVD. Respiratory: CTA Bilaterally. Normal effort. GI: BS +. Non-distended. Musc: No edema or tenderness in extremities. Skin: Scattered excoriations Neurologic: Alert. Motor: 5/5 in right  deltoid, bicep, tricep, grip, hip flexor, knee extensors, ankle dorsiflexor and plantar flexor Left upper extremity: Shoulder abduction, elbow flexion/extension 2/5, wrist extension, handgrip 0/5 Left lower extremity: Hip flexion, knee extension 4/5, ankle dorsiflexion 2/5   Assessment/Plan: 1. Functional deficits secondary to RIght MCA infarct  which require 3+ hours per day of interdisciplinary therapy in a comprehensive inpatient rehab setting.  Physiatrist is providing close team supervision and 24 hour management of active medical problems listed below.  Physiatrist and rehab team continue to assess barriers to discharge/monitor patient progress toward functional and medical goals  Care Tool:  Bathing    Body parts bathed by patient: Left arm, Chest, Abdomen, Front perineal area,  Buttocks, Right upper leg, Left upper leg, Face, Right lower leg   Body parts bathed by helper: Right arm, Left lower leg     Bathing assist Assist Level: Minimal Assistance - Patient > 75%     Upper Body Dressing/Undressing Upper body dressing   What is the patient wearing?: Pull over shirt    Upper body assist Assist Level: Minimal Assistance - Patient > 75%    Lower Body Dressing/Undressing Lower body dressing      What is the patient wearing?: Pants     Lower body assist Assist for lower body dressing: Moderate Assistance - Patient 50 - 74%     Toileting Toileting    Toileting assist Assist for toileting: Contact Guard/Touching assist     Transfers Chair/bed transfer  Transfers assist     Chair/bed transfer assist level: Minimal Assistance - Patient > 75%     Locomotion Ambulation   Ambulation assist      Assist level: Contact Guard/Touching assist Assistive device: Cane-straight Max distance: 80 ft   Walk 10 feet activity   Assist     Assist level: Minimal Assistance - Patient > 75% Assistive device: Cane-straight   Walk 50 feet activity   Assist    Assist level: Minimal Assistance - Patient > 75% Assistive device: Cane-straight    Walk 150 feet activity   Assist Walk 150 feet activity did not occur: Safety/medical concerns(medical- felt like he was going to pass out )         Walk 10 feet on uneven surface  activity   Assist Walk 10 feet on uneven surfaces activity did not occur: Safety/medical concerns(medical- felt like he was going to  pass out )         Wheelchair     Assist Will patient use wheelchair at discharge?: Yes Type of Wheelchair: Manual    Wheelchair assist level: Supervision/Verbal cueing Max wheelchair distance: 3ft     Wheelchair 50 feet with 2 turns activity    Assist        Assist Level: Supervision/Verbal cueing   Wheelchair 150 feet activity     Assist Wheelchair 150 feet  activity did not occur: Safety/medical concerns(fatigue )        Medical Problem List and Plan: 1.Left hemiparesissecondary to Right MCA small embolic infarcts due to chronic right ICA occlusion in the setting of cocaine abuse and suspect medical noncompliance   Continue CIR  Notes reviewed- stroke, images reviewed- right-sided infarcts, labs reviewed 2. DVT Prophylaxis/Anticoagulation: Subcutaneous Lovenox. Monitor for any bleeding episodes 3. Pain Management:Lyrica 150 mg twice a day  -robaxin prn for leg spasms 4. Mood:Namenda 10 mg twice a day, Cymbalta 60 mg daily 5. Neuropsych: This patientis?  Fully capable of making decisions on hisown behalf.  6. Skin/Wound Care:Routine skin checks 7. Fluids/Electrolytes/Nutrition:Routine and out's 8. Seizure disorder. Keppra 500 mg twice a day 9. History of polysubstance abuse / abuse tobacco abuse. UDS positive for cocaine. Provide counseling. Continue NicoDerm patch 10. Diabetes mellitus with peripheral neuropathy.  CBG (last 3)  No results for input(s): GLUCAP in the last 72 hours.  d/c'ed CBG and SSI 11. Diastolic congestive heart failure. Monitor for any signs of fluid overload Daily weights ordered, pending Filed Weights   01/18/18 1519 01/19/18 0349  Weight: 101.6 kg 102.4 kg   12.  Hypertension Vitals:   01/24/18 0712 01/24/18 0838  BP:  (!) 158/74  Pulse:  71  Resp:    Temp:    SpO2: 97%    Labile on 12/16 13. Hyperlipidemia. Lipitor 14. History of gout. Zyloprim 300 mg daily 15. Constipation. Laxative assistance 16.  Sleep disturbance: Improved on Ambien 5 mg, monitor for confusion 17.  Pruritus  Atarax scheduled 18.  Hypoalbuminemia  Supplement initiated on 12/16  LOS: 6 days A FACE TO FACE EVALUATION WAS PERFORMED  Ankit Lorie Phenix 01/24/2018, 12:44 PM

## 2018-01-24 NOTE — Progress Notes (Signed)
Physical Therapy Session Note  Patient Details  Name: [name] MRN: [id number] Date of Birth: [birth year]  Today's Date: 01/24/2018 PT Individual Time: 0900-1000 PT Individual Time Calculation (min): 60 min   Short Term Goals: Week 1:  PT Short Term Goal 1 (Week 1): Patient to be able to perform all bed mobility with S  PT Short Term Goal 2 (Week 1): Patient to perform all functional bed to chair transfers with min guard  PT Short Term Goal 3 (Week 1): Patient to be able to ambulate at least 149ft with LRAD and min guard  PT Short Term Goal 4 (Week 1): Patient to be able to ascend/descent single step curb with LRAD and MinA   Skilled Therapeutic Interventions/Progress Updates:    Pt seated in w/c upon PT arrival, agreeable to therapy tx and denies pain. Therapist checks BP while seated in w/c, thigh high teds donned- 147/72. Pt transferred from sit<>stand with min assist, standing BP 126/77 with pt symptomatic. Abdominal binder donned and pt transported to the gym. Stand pivot to the mat with min assist and pt transferred to long sitting with min assist. Pt participated in positional vestibular testing. Therapist performed [employer] testing, pt positive bilaterally with symptoms worse on L side. Therapist performed L canalith repositioning maneuver for tx of L BPPV this session. BP in sitting 144/80. Pt ambulated x 80 ft this session with SPC and min assist, pt with wide BOS and decreased gait speed. Pt worked on L NMR and dynamic standing balance to perform toe taps to 4 inch step with min assist and to perform L LE kicking in place x 10 kicks. Pt transported back to room and left seated in w/c with needs in reach and chair alarm set.   Therapy Documentation Precautions:  Precautions Precautions: Fall, Other (comment) Precaution Comments: L hemiparesis UE>LE, positive orthostatics- watch BP  Restrictions Weight Bearing Restrictions: No    Therapy/Group: Individual  Therapy  Netta Corrigan, PT, DPT 01/24/2018, 7:53 AM

## 2018-01-24 NOTE — Progress Notes (Signed)
Occupational Therapy Session Note  Patient Details  Name: [name] MRN: [id number] Date of Birth: 08/03/1950  Today's Date: 01/24/2018 OT Individual Time: 7096-2836 OT Individual Time Calculation (min): 75 min    Short Term Goals: Week 1:  OT Short Term Goal 1 (Week 1): Pt will be able to complete stand pivot to toilet with close S. OT Short Term Goal 2 (Week 1): Pt will be able to bathe with long handled sponge with CGA. OT Short Term Goal 3 (Week 1): Pt will don shirt with (with A to lift L arm) S. OT Short Term Goal 4 (Week 1): Pt will don pants with min A. OT Short Term Goal 5 (Week 1): Pt will use L arm as a stabilizer with mod cues.   Skilled Therapeutic Interventions/Progress Updates:    Pt resting in bed upon arrival.  OT intervention with focus on bed mobility, stand pivot transfers, BADL retraining, toileting, self feeding, sitting balance, standing balance, activity tolerance, and safety awareness to increase independence with BADLs.  Pt required min A for supine>sit EOB and CGA for stand pivot transfer to w/c.  Pt completed BADLs per below.  Pt with no s/s of dizziness this morning.  Pt requires CGA for standing balance for LB bathing/dressing tasks.  Pt continues to require min A for UB dressing tasks.  Pt commented that at home he typically goes barefoot or wear his slip on moccasins. Pt required assistance opening containers on breakfast tray.  Pt remained seated in w/c with all needs within reach and belt alarm activated.    Therapy Documentation Precautions:  Precautions Precautions: Fall, Other (comment) Precaution Comments: L hemiparesis UE>LE, positive orthostatics- watch BP  Restrictions Weight Bearing Restrictions: No Pain:  Pt denies pain but c/o consistent itching; RN and MD aware ADL: ADL Eating: Set up Where Assessed-Eating: Wheelchair Grooming: Setup Where Assessed-Grooming: Wheelchair, Sitting at sink Upper Body Bathing: Minimal  assistance Where Assessed-Upper Body Bathing: Shower Lower Body Bathing: Minimal assistance Where Assessed-Lower Body Bathing: Shower Upper Body Dressing: Minimal assistance Where Assessed-Upper Body Dressing: Sitting at sink, Wheelchair Lower Body Dressing: Minimal assistance Where Assessed-Lower Body Dressing: Sitting at sink, Standing at sink, Wheelchair Toileting: Contact guard Where Assessed-Toileting: Glass blower/designer: Therapist, music Method: Arts development officer: Grab bars, Raised toilet seat Social research officer, government: Curator Method: Radiographer, therapeutic: Grab bars, Shower seat with back   Therapy/Group: Individual Therapy  Leroy Libman 01/24/2018, 10:07 AM

## 2018-01-25 ENCOUNTER — Inpatient Hospital Stay (HOSPITAL_COMMUNITY): Payer: Self-pay | Admitting: Speech Pathology

## 2018-01-25 ENCOUNTER — Inpatient Hospital Stay (HOSPITAL_COMMUNITY): Payer: Self-pay | Admitting: Physical Therapy

## 2018-01-25 ENCOUNTER — Inpatient Hospital Stay (HOSPITAL_COMMUNITY): Payer: Self-pay

## 2018-01-25 LAB — CREATININE, SERUM
Creatinine, Ser: 0.88 mg/dL (ref 0.61–1.24)
GFR calc non Af Amer: >60 mL/min (ref >60)

## 2018-01-25 MED ORDER — SENNA 8.6 MG PO TABS
1.0000 | ORAL_TABLET | Freq: Every day | ORAL | Status: DC
  Administered 2018-01-25 – 2018-02-03 (×10): 8.6 mg via ORAL
  Filled 2018-01-25 (×9): qty 1

## 2018-01-25 MED ORDER — DOCUSATE SODIUM 100 MG PO CAPS
100.0000 mg | ORAL_CAPSULE | Freq: Three times a day (TID) | ORAL | Status: DC
  Administered 2018-01-25 – 2018-01-27 (×6): 100 mg via ORAL
  Filled 2018-01-25 (×6): qty 1

## 2018-01-25 MED ORDER — HYDROXYZINE HCL 25 MG PO TABS
25.0000 mg | ORAL_TABLET | Freq: Four times a day (QID) | ORAL | Status: DC
  Administered 2018-01-25 – 2018-02-04 (×38): 25 mg via ORAL
  Filled 2018-01-25 (×41): qty 1

## 2018-01-25 NOTE — Plan of Care (Signed)
  Problem: Consults Goal: RH STROKE PATIENT EDUCATION Description See Patient Education module for education specifics  Outcome: Progressing   Problem: RH SKIN INTEGRITY Goal: RH STG SKIN FREE OF INFECTION/BREAKDOWN Description No new skin breakdown with min assist    Outcome: Progressing   Problem: RH SAFETY Goal: RH STG ADHERE TO SAFETY PRECAUTIONS W/ASSISTANCE/DEVICE Description STG Adhere to Safety Precautions With Mod I Assistance/Device.  Outcome: Progressing   Problem: RH PAIN MANAGEMENT Goal: RH STG PAIN MANAGED AT OR BELOW PT'S PAIN GOAL Description < 3 out of 10.   Outcome: Progressing   Problem: RH KNOWLEDGE DEFICIT Goal: RH STG INCREASE KNOWLEDGE OF DIABETES Description Pt will be able to verbalize 2 ways he can manage his blood sugar at home at the time of discharge.   Outcome: Progressing Goal: RH STG INCREASE KNOWLEDGE OF HYPERTENSION Description Pt will be able to verbalize 2 ways to manage his blood pressure with min cues at the time of discharge.   Outcome: Progressing

## 2018-01-25 NOTE — Progress Notes (Signed)
Occupational Therapy Session Note  Patient Details  Name: [name] MRN: [id number] Date of Birth: February 26, 1950  Today's Date: 01/25/2018 OT Individual Time: 3435-6861 OT Individual Time Calculation (min): 75 min    Short Term Goals: Week 2:  OT Short Term Goal 1 (Week 2): Pt will be able to complete stand pivot to toilet with close S. OT Short Term Goal 2 (Week 2): Pt will don shirt with (with A to lift L arm) S. OT Short Term Goal 3 (Week 2): Pt will perform 3/3 toileting tasks with CGA  Skilled Therapeutic Interventions/Progress Updates:    Pt resting in bed upon arrival and agreeable to therapy.  OT intervention with focus on BADL retraining, bed mobility, sit<>stand, standing balance, functional transfers, activity tolerance, and safety awareness to increase independence with BADLs.  Pt required mod A for supine>sit EOB this morning but performed squat pivot transfer to w/c with steady A and min verbal cues for safety awareness.  Pt requested to use toilet and stood at toilet with CGA to void.  Pt completed bathing/dressing tasks per below. No s/s of dizziness this morning.    Therapy Documentation Precautions:  Precautions Precautions: Fall, Other (comment) Precaution Comments: L hemiparesis UE>LE, positive orthostatics- watch BP  Restrictions Weight Bearing Restrictions: No Pain: Pain Assessment Pain Scale: 0-10 Pain Score: 0-No pain ADL: ADL Eating: Set up Where Assessed-Eating: Wheelchair Grooming: Setup Where Assessed-Grooming: Wheelchair, Sitting at sink Upper Body Bathing: Minimal assistance Where Assessed-Upper Body Bathing: Wheelchair Lower Body Bathing: Minimal assistance Where Assessed-Lower Body Bathing: Sitting at sink, Standing at sink, Wheelchair Upper Body Dressing: Minimal assistance Where Assessed-Upper Body Dressing: Sitting at sink, Wheelchair Lower Body Dressing: Minimal assistance Where Assessed-Lower Body Dressing: Sitting at sink, Standing  at sink, Wheelchair Toileting: Contact guard Where Assessed-Toileting: Editor, commissioning Method: Stand Ecologist: Grab bars, Raised toilet seat   Therapy/Group: Individual Therapy  Leroy Libman 01/25/2018, 12:30 PM

## 2018-01-25 NOTE — Progress Notes (Signed)
Occupational Therapy Weekly Progress Note  Patient Details  Name: [name] MRN: [id number] Date of Birth: 17-Sep-1950  Beginning of progress report period: January 19, 2018 End of progress report period: January 25, 2018  Patient has met 3 of 5 short term goals.  Pt is beginning to make steady progress with BADLs.  Pt initially with hypotension and n/v deterring progress and participation.  Pt performs functional transfers with min A/CGA and completes bathing at shower level using grab bars and long handle sponge.  Pt requires min A for UB dressing and donning pants.  Pt states that typically he wears his slip on moccasins when at home.  Pt requires min A for toileting tasks.  BP drops with activity limiting his ability to demonstrate functional ambulation for longer distance with SPC.   Patient continues to demonstrate the following deficits: muscle weakness, decreased cardiorespiratoy endurance, impaired timing and sequencing, abnormal tone and decreased coordination and decreased standing balance and hemiplegia and therefore will continue to benefit from skilled OT intervention to enhance overall performance with BADL and functional mobility .  Patient progressing toward long term goals..  Continue plan of care.  OT Short Term Goals Week 1:  OT Short Term Goal 1 (Week 1): Pt will be able to complete stand pivot to toilet with close S. OT Short Term Goal 1 - Progress (Week 1): Progressing toward goal OT Short Term Goal 2 (Week 1): Pt will be able to bathe with long handled sponge with CGA. OT Short Term Goal 2 - Progress (Week 1): Met OT Short Term Goal 3 (Week 1): Pt will don shirt with (with A to lift L arm) S. OT Short Term Goal 3 - Progress (Week 1): Progressing toward goal OT Short Term Goal 4 (Week 1): Pt will don pants with min A. OT Short Term Goal 4 - Progress (Week 1): Met OT Short Term Goal 5 (Week 1): Pt will use L arm as a stabilizer with mod cues.  OT Short Term Goal  5 - Progress (Week 1): Met Week 2:  OT Short Term Goal 1 (Week 2): Pt will be able to complete stand pivot to toilet with close S. OT Short Term Goal 2 (Week 2): Pt will don shirt with (with A to lift L arm) S. OT Short Term Goal 3 (Week 2): Pt will perform 3/3 toileting tasks with CGA      Therapy Documentation Precautions:  Precautions Precautions: Fall, Other (comment) Precaution Comments: L hemiparesis UE>LE, positive orthostatics- watch BP  Restrictions Weight Bearing Restrictions: No    Leroy Libman 01/25/2018, 6:55 AM

## 2018-01-25 NOTE — Progress Notes (Signed)
Speech Language Pathology Discharge Summary  Patient Details  Name: [name] MRN: [id number] Date of Birth: 1950/03/08  Today's Date: 01/25/2018 SLP Individual Time: 1130-1200 SLP Individual Time Calculation (min): 30 min   Skilled Therapeutic Interventions:  Skilled treatment session focused on speech goals. Patient was ~90-100% intelligible at the conversation level with overall Mod I and recalled events from previous therapy sessions and goals of skilled therapy with Mod I. Suspect patient is at baseline for both cognitive functioning and speech intelligibility, therefore, patient will be discharged from skilled SLP intervention. Patient verbalized understanding and agreement.  Patient has met 4 of 4 long term goals.  Patient to discharge at overall Modified Independent level.   Reasons goals not met: N/A   Clinical Impression/Discharge Summary: Patient has made functional gains and has met 4 of 4 LTG's this admission. Currently, patient is overall Mod I for complex problem solving, attention and awareness with h/o baseline memory deficits. Patient is also at his baseline for speech intelligibility, therefore, patient will be discharged from skilled SLP intervention with f/u not warranted at this time. Patient verbalized understanding and agreement.    Recommendation:  None      Equipment: N/A   Reasons for discharge: Treatment goals met   Patient/Family Agrees with Progress Made and Goals Achieved: Yes    [name], [place] 01/25/2018, 3:31 PM

## 2018-01-25 NOTE — Progress Notes (Signed)
Physical Therapy Session Note  Patient Details  Name: [name] MRN: [id number] Date of Birth: 04-Jun-1950  Today's Date: 01/25/2018 PT Individual Time: 1000-1055 PT Individual Time Calculation (min): 55 min   Short Term Goals: Week 1:  PT Short Term Goal 1 (Week 1): Patient to be able to perform all bed mobility with S  PT Short Term Goal 2 (Week 1): Patient to perform all functional bed to chair transfers with min guard  PT Short Term Goal 3 (Week 1): Patient to be able to ambulate at least 171ft with LRAD and min guard  PT Short Term Goal 4 (Week 1): Patient to be able to ascend/descent single step curb with LRAD and MinA   Skilled Therapeutic Interventions/Progress Updates:   Pt in w/c and agreeable to therapy, denies pain. Pt reports feeling much less dizziness today, asymptomatic w/ upright mobility. Thigh high TEDs and abdominal binder donned during session. Ambulated in multiple 50-100' bouts w/ CGA using SPC. NuStep 5 min x2 @ level 5 for LE strengthening and endurance training. Worked on standing balance w/ progressively smaller BOS, w/o UE support, while performing card matching task. CGA for balance, no overt LOB though pt was swaying. Returned to room, ended session in w/c and all needs in reach.   Therapy Documentation Precautions:  Precautions Precautions: Fall, Other (comment) Precaution Comments: L hemiparesis UE>LE, positive orthostatics- watch BP  Restrictions Weight Bearing Restrictions: No Vital Signs: Oxygen Therapy SpO2: 92 % O2 Device: Room Air Pain: Pain Assessment Pain Scale: 0-10 Pain Score: 0-No pain  Therapy/Group: Individual Therapy  Lukas Pelcher Clent Demark 01/25/2018, 10:58 AM

## 2018-01-25 NOTE — Progress Notes (Signed)
Physical Therapy Session Note  Patient Details  Name: [name] MRN: [id number] Date of Birth: 06/13/1950  Today's Date: 01/25/2018 PT Individual Time: 8333-8329 PT Individual Time Calculation (min): 30 min   Short Term Goals: Week 1:  PT Short Term Goal 1 (Week 1): Patient to be able to perform all bed mobility with S  PT Short Term Goal 2 (Week 1): Patient to perform all functional bed to chair transfers with min guard  PT Short Term Goal 3 (Week 1): Patient to be able to ambulate at least 19ft with LRAD and min guard  PT Short Term Goal 4 (Week 1): Patient to be able to ascend/descent single step curb with LRAD and MinA   Skilled Therapeutic Interventions/Progress Updates:   Pt supine in bed upon PT arrival, agreeable to therapy tx and denies pain. Pt transferrd to sitting EOB with supervision, donned teds with mod assist and donned abdominal binder. Pt performed stand pivot to w/c with min assist and transported to the gym. BP in sitting this session 128/62 with c/o of lightheadedness. Pt performed sit<>stand with SPC and CGA, BP in standing 103/54 with pt symptomatic. Pt given water, encouraged to drink fluids. Pt performed sit<>stand again, min assist with BP 115/64. Pt ambulated x 35 ft with SPC and min assist, no increase in dizziness. Pt transported back to room and transferred to bed, left supine with needs in reach.   Therapy Documentation Precautions:  Precautions Precautions: Fall, Other (comment) Precaution Comments: L hemiparesis UE>LE, positive orthostatics- watch BP  Restrictions Weight Bearing Restrictions: No    Therapy/Group: Individual Therapy  Netta Corrigan, PT, DPT 01/25/2018, 3:28 PM

## 2018-01-25 NOTE — Progress Notes (Signed)
[facility] PHYSICAL MEDICINE & REHABILITATION PROGRESS NOTE   Subjective/Complaints: Patient seen working with therapy this morning.  He states he slept fairly over the night.  He notes no improvement in pruritus.  Patient states he had a bowel movement yesterday, confirmed with therapist.  Large and hard.  ROS: + Pruritus.  Denies CP, SOB, N/V/D  Objective:   No results found. No results for input(s): WBC, HGB, HCT, PLT in the last 72 hours. Recent Labs    01/25/18 0502  CREATININE 0.88    Intake/Output Summary (Last 24 hours) at 01/25/2018 1004 Last data filed at 01/25/2018 0900 Gross per 24 hour  Intake 1200 ml  Output 1425 ml  Net -225 ml     Physical Exam: Vital Signs Blood pressure (!) 118/57, pulse 68, temperature 97.9 F (36.6 C), temperature source Oral, resp. rate 16, height 6' (1.829 m), weight 93.9 kg, SpO2 92 %.  Constitutional: No distress . Vital signs reviewed. HENT: Normocephalic.  Atraumatic. Eyes: EOMI. No discharge. Cardiovascular: RRR.  No JVD. Respiratory: CTA bilaterally.  Normal effort. GI: BS +. Non-distended. Musc: No edema or tenderness in extremities. Skin: Scattered excoriations Neurologic: Alert. Motor: 5/5 in right  deltoid, bicep, tricep, grip, hip flexor, knee extensors, ankle dorsiflexor and plantar flexor Left upper extremity: Shoulder abduction, elbow flexion/extension 2+/5, wrist extension, handgrip 0/5 Left lower extremity: Hip flexion, knee extension 4/5, ankle dorsiflexion 2/5, stable  Assessment/Plan: 1. Functional deficits secondary to RIght MCA infarct  which require 3+ hours per day of interdisciplinary therapy in a comprehensive inpatient rehab setting.  Physiatrist is providing close team supervision and 24 hour management of active medical problems listed below.  Physiatrist and rehab team continue to assess barriers to discharge/monitor patient progress toward functional and medical goals  Care Tool:  Bathing     Body parts bathed by patient: Left arm, Chest, Abdomen, Front perineal area, Buttocks, Right upper leg, Left upper leg, Face, Right lower leg   Body parts bathed by helper: Right arm, Left lower leg     Bathing assist Assist Level: Minimal Assistance - Patient > 75%     Upper Body Dressing/Undressing Upper body dressing   What is the patient wearing?: Pull over shirt    Upper body assist Assist Level: Minimal Assistance - Patient > 75%    Lower Body Dressing/Undressing Lower body dressing      What is the patient wearing?: Pants     Lower body assist Assist for lower body dressing: Moderate Assistance - Patient 50 - 74%     Toileting Toileting    Toileting assist Assist for toileting: Contact Guard/Touching assist     Transfers Chair/bed transfer  Transfers assist     Chair/bed transfer assist level: Minimal Assistance - Patient > 75%     Locomotion Ambulation   Ambulation assist      Assist level: Contact Guard/Touching assist Assistive device: Cane-straight Max distance: 80 ft   Walk 10 feet activity   Assist     Assist level: Minimal Assistance - Patient > 75% Assistive device: Cane-straight   Walk 50 feet activity   Assist    Assist level: Minimal Assistance - Patient > 75% Assistive device: Cane-straight    Walk 150 feet activity   Assist Walk 150 feet activity did not occur: Safety/medical concerns(medical- felt like he was going to pass out )         Walk 10 feet on uneven surface  activity   Assist Walk 10 feet on  uneven surfaces activity did not occur: Safety/medical concerns(medical- felt like he was going to pass out )         Wheelchair     Assist Will patient use wheelchair at discharge?: Yes Type of Wheelchair: Manual    Wheelchair assist level: Supervision/Verbal cueing Max wheelchair distance: 38ft     Wheelchair 50 feet with 2 turns activity    Assist        Assist Level:  Supervision/Verbal cueing   Wheelchair 150 feet activity     Assist Wheelchair 150 feet activity did not occur: Safety/medical concerns(fatigue )        Medical Problem List and Plan: 1.Left hemiparesissecondary to Right MCA small embolic infarcts due to chronic right ICA occlusion in the setting of cocaine abuse and suspect medical noncompliance   Continue CIR 2. DVT Prophylaxis/Anticoagulation: Subcutaneous Lovenox. Monitor for any bleeding episodes 3. Pain Management:Lyrica 150 mg twice a day  -robaxin prn for leg spasms 4. Mood:Namenda 10 mg twice a day, Cymbalta 60 mg daily 5. Neuropsych: This patientis?  Fully capable of making decisions on hisown behalf.  6. Skin/Wound Care:Routine skin checks 7. Fluids/Electrolytes/Nutrition:Routine and out's  Creatinine within normal limits on 12/17 8. Seizure disorder. Keppra 500 mg twice a day 9. History of polysubstance abuse / abuse tobacco abuse. UDS positive for cocaine. Provide counseling. Continue NicoDerm patch 10. Diabetes mellitus with peripheral neuropathy.  CBG (last 3)  No results for input(s): GLUCAP in the last 72 hours.  d/c'ed CBG and SSI 11. Diastolic congestive heart failure. Monitor for any signs of fluid overload Daily weights improving Filed Weights   01/18/18 1519 01/19/18 0349 01/25/18 0500  Weight: 101.6 kg 102.4 kg 93.9 kg   12.  Hypertension Vitals:   01/25/18 0515 01/25/18 0716  BP: (!) 118/57   Pulse: 68   Resp: 16   Temp: 97.9 F (36.6 C)   SpO2: 92% 92%   Labile on 12/17, continue to monitor for trend 13. Hyperlipidemia. Lipitor 14. History of gout. Zyloprim 300 mg daily 15. Constipation. Laxative assistance  Stool softener increased on 12/17 16.  Sleep disturbance: Improved on Ambien 5 mg, monitor for confusion 17.  Pruritus  Atarax scheduled, increased on 12/17 18.  Hypoalbuminemia  Supplement initiated on 12/16  LOS: 7 days A FACE TO FACE EVALUATION WAS  PERFORMED  Ankit Lorie Phenix 01/25/2018, 10:04 AM

## 2018-01-26 ENCOUNTER — Inpatient Hospital Stay (HOSPITAL_COMMUNITY): Payer: Self-pay

## 2018-01-26 ENCOUNTER — Inpatient Hospital Stay (HOSPITAL_COMMUNITY): Payer: Self-pay | Admitting: Occupational Therapy

## 2018-01-26 ENCOUNTER — Encounter (HOSPITAL_COMMUNITY): Payer: Self-pay | Admitting: Psychology

## 2018-01-26 ENCOUNTER — Inpatient Hospital Stay (HOSPITAL_COMMUNITY): Payer: Self-pay | Admitting: Physical Therapy

## 2018-01-26 MED ORDER — BACLOFEN 10 MG PO TABS
10.0000 mg | ORAL_TABLET | Freq: Every day | ORAL | Status: DC
  Administered 2018-01-26 – 2018-02-03 (×9): 10 mg via ORAL
  Filled 2018-01-26 (×9): qty 1

## 2018-01-26 MED ORDER — FUROSEMIDE 40 MG PO TABS
40.0000 mg | ORAL_TABLET | Freq: Every day | ORAL | Status: DC
  Administered 2018-01-27 – 2018-01-28 (×2): 40 mg via ORAL
  Filled 2018-01-26 (×2): qty 1

## 2018-01-26 MED ORDER — TIZANIDINE HCL 2 MG PO TABS: 2.0000 mg | ORAL_TABLET | Freq: Every day | ORAL | Status: DC

## 2018-01-26 NOTE — Progress Notes (Signed)
[place] PHYSICAL MEDICINE & REHABILITATION PROGRESS NOTE   Subjective/Complaints:  Slept poorly , "left leg jerking" last noc  ROS: + Pruritus.  Denies CP, SOB, N/V/D  Objective:   No results found. No results for input(s): WBC, HGB, HCT, PLT in the last 72 hours. Recent Labs    01/25/18 0502  CREATININE 0.88    Intake/Output Summary (Last 24 hours) at 01/26/2018 1013 Last data filed at 01/26/2018 0747 Gross per 24 hour  Intake 480 ml  Output 1950 ml  Net -1470 ml     Physical Exam: Vital Signs Blood pressure 126/60, pulse 71, temperature 97.9 F (36.6 C), resp. rate 18, height 6' (1.829 m), weight 97.3 kg, SpO2 96 %.  Constitutional: No distress . Vital signs reviewed. HENT: Normocephalic.  Atraumatic. Eyes: EOMI. No discharge. Cardiovascular: RRR.  No JVD. Respiratory: CTA bilaterally.  Normal effort. GI: BS +. Non-distended. Musc: No edema or tenderness in extremities. Skin: Scattered excoriations Neurologic: Alert. Motor: 5/5 in right  deltoid, bicep, tricep, grip, hip flexor, knee extensors, ankle dorsiflexor and plantar flexor Left upper extremity: Shoulder abduction, elbow flexion/extension 2+/5, wrist extension, handgrip 0/5 Left lower extremity: Hip flexion, knee extension 4/5, ankle dorsiflexion 2/5, stable No clonus at knee , few beats at Left ankle Assessment/Plan: 1. Functional deficits secondary to RIght MCA infarct  which require 3+ hours per day of interdisciplinary therapy in a comprehensive inpatient rehab setting.  Physiatrist is providing close team supervision and 24 hour management of active medical problems listed below.  Physiatrist and rehab team continue to assess barriers to discharge/monitor patient progress toward functional and medical goals  Care Tool:  Bathing    Body parts bathed by patient: Left arm, Chest, Abdomen, Front perineal area, Buttocks, Right upper leg, Left upper leg, Face, Right lower leg   Body parts bathed by  helper: Right arm, Left lower leg     Bathing assist Assist Level: Minimal Assistance - Patient > 75%     Upper Body Dressing/Undressing Upper body dressing   What is the patient wearing?: Pull over shirt    Upper body assist Assist Level: Minimal Assistance - Patient > 75%    Lower Body Dressing/Undressing Lower body dressing      What is the patient wearing?: Pants     Lower body assist Assist for lower body dressing: Moderate Assistance - Patient 50 - 74%     Toileting Toileting    Toileting assist Assist for toileting: Contact Guard/Touching assist     Transfers Chair/bed transfer  Transfers assist     Chair/bed transfer assist level: Minimal Assistance - Patient > 75%     Locomotion Ambulation   Ambulation assist      Assist level: Contact Guard/Touching assist Assistive device: Cane-straight Max distance: 100'   Walk 10 feet activity   Assist     Assist level: Contact Guard/Touching assist Assistive device: Cane-straight   Walk 50 feet activity   Assist    Assist level: Contact Guard/Touching assist Assistive device: Cane-straight    Walk 150 feet activity   Assist Walk 150 feet activity did not occur: Safety/medical concerns(medical- felt like he was going to pass out )         Walk 10 feet on uneven surface  activity   Assist Walk 10 feet on uneven surfaces activity did not occur: Safety/medical concerns(medical- felt like he was going to pass out )         Insurance risk surveyor Will  patient use wheelchair at discharge?: Yes Type of Wheelchair: Manual    Wheelchair assist level: Supervision/Verbal cueing Max wheelchair distance: 51f     Wheelchair 50 feet with 2 turns activity    Assist        Assist Level: Supervision/Verbal cueing   Wheelchair 150 feet activity     Assist Wheelchair 150 feet activity did not occur: Safety/medical concerns(fatigue )        Medical Problem List and  Plan: 1.Left hemiparesissecondary to Right MCA small embolic infarcts due to chronic right ICA occlusion in the setting of cocaine abuse and suspect medical noncompliance   Team conference today please see physician documentation under team conference tab, met with team face-to-face to discuss problems,progress, and goals. Formulized individual treatment plan based on medical history, underlying problem and comorbidities.  LLE shaking likely spasticity zanaflex at noc 2. DVT Prophylaxis/Anticoagulation: Subcutaneous Lovenox. Monitor for any bleeding episodes 3. Pain Management:Lyrica 150 mg twice a day  -robaxin prn for leg spasms 4. Mood:Namenda 10 mg twice a day, Cymbalta 60 mg daily 5. Neuropsych: This patientis?  Fully capable of making decisions on hisown behalf.  6. Skin/Wound Care:Routine skin checks 7. Fluids/Electrolytes/Nutrition:Routine and out's  Creatinine within normal limits on 12/17 8. Seizure disorder. Keppra 500 mg twice a day 9. History of polysubstance abuse / abuse tobacco abuse. UDS positive for cocaine. Provide counseling. Continue NicoDerm patch 10. Diabetes mellitus with peripheral neuropathy.  Diet controlled 11. Diastolic congestive heart failure. Monitor for any signs of fluid overload Daily weights improving Filed Weights   01/19/18 0349 01/25/18 0500 01/26/18 0415  Weight: 102.4 kg 93.9 kg 97.3 kg   12.  Hypertension Vitals:   01/26/18 0415 01/26/18 0834  BP: 126/60   Pulse: 71   Resp: 18   Temp: 97.9 F (36.6 C)   SpO2: 92% 96%  controlled 12/18 13. Hyperlipidemia. Lipitor 14. History of gout. Zyloprim 300 mg daily 15. Constipation. Laxative assistance  Stool softener increased on 12/17 16.  Sleep disturbance: Improved on Ambien 5 mg, monitor for confusion 17.  Pruritus- improved  Atarax scheduled, increased on 12/17 18.  Hypoalbuminemia  Supplement initiated on 12/16  LOS: 8 days A FACE TO FACE EVALUATION WAS  PERFORMED  ACharlett Blake12/18/2019, 10:13 AM

## 2018-01-26 NOTE — Progress Notes (Signed)
Social Work Patient ID: [name], male   DOB: 18-Oct-1950, 67 y.o.   MRN: [id number] Met with pt to discuss team conference goals supervision-mod/i level and target discharge date 12/24. He plans on going home from here and not back to an ALF. He feels it will not be a good experience. He is willing to pay for a charger for his power chair since it has been lost. Have contacted ALF and they have no record of his charge for his chair or his [place] chair. Will work on discharge needs.

## 2018-01-26 NOTE — Progress Notes (Signed)
Occupational Therapy Session Note  Patient Details  Name: [name] MRN: [id number] Date of Birth: October 27, 1950  Today's Date: 01/26/2018 OT Individual Time: 1050-1130 OT Individual Time Calculation (min): 40 min    Short Term Goals: Week 1:  OT Short Term Goal 1 (Week 1): Pt will be able to complete stand pivot to toilet with close S. OT Short Term Goal 1 - Progress (Week 1): Progressing toward goal OT Short Term Goal 2 (Week 1): Pt will be able to bathe with long handled sponge with CGA. OT Short Term Goal 2 - Progress (Week 1): Met OT Short Term Goal 3 (Week 1): Pt will don shirt with (with A to lift L arm) S. OT Short Term Goal 3 - Progress (Week 1): Progressing toward goal OT Short Term Goal 4 (Week 1): Pt will don pants with min A. OT Short Term Goal 4 - Progress (Week 1): Met OT Short Term Goal 5 (Week 1): Pt will use L arm as a stabilizer with mod cues.  OT Short Term Goal 5 - Progress (Week 1): Met Week 2:  OT Short Term Goal 1 (Week 2): Pt will be able to complete stand pivot to toilet with close S. OT Short Term Goal 2 (Week 2): Pt will don shirt with (with A to lift L arm) S. OT Short Term Goal 3 (Week 2): Pt will perform 3/3 toileting tasks with CGA  Skilled Therapeutic Interventions/Progress Updates:    Pt received in w/c. Adjusted abdominal binder on pt.  Pt taken to gym and worked on [employer] with table top slides to facilitate shoulder and elbow and then self ROM on table. Attempted to work on standing balance, but once pt was standing he became very dizzy.  Dizziness resolved with rest in sitting. Instead engaged pt in a game of checkers working on dynamic reaching. Pt taken back to room and belt alarm on and all needs met.  Therapy Documentation Precautions:  Precautions Precautions: Fall, Other (comment) Precaution Comments: L hemiparesis UE>LE, positive orthostatics- watch BP  Restrictions Weight Bearing Restrictions: No    Pain:  no c/o  pain  Therapy/Group: Individual Therapy  [place] 01/26/2018, 1:02 PM

## 2018-01-26 NOTE — Consult Note (Signed)
Neuropsychological Consultation   Patient:   [name]   DOB:   [birth year]  MR Number:  [id number]  Location:  [facility] [id number] [place] [address] Dept: [place]: [phone]           Date of Service:   01/26/2018  Start Time:   1 PM End Time:   2 PM  Provider/Observer:  Ilean Skill, Psy.D.       Clinical Neuropsychologist       Billing Code/Service: [phone] Units  Chief Complaint:    [name] is a 67 year old male with history of diabetes, seizure disorder, CAD, polysubstance and alcohol abuse, CHF, COPD, chronically occluded right ICA with recent CVA with left-sided residual weakness.  Presented on 01/13/2018 with increased weakness over the past 2 to 3 days.  Urine drug screen was positive for cocaine.  MRI showed multifocal acute small right frontal MCA territory infarctions.  There were also old right MCA infarctions.    The patient acknowledged a long history of various substance abuse and alcohol abuse.  He freely admitted to recent cocaine use prior to his most recent stroke.  The patient attributes his cocaine use to "boredom."  Reason for Service:  The patient was referred for neuropsychological consultation due to coping and adjustment issues and concerns about substance abuse and the connection it has 2 recurrent strokes and his seizure disorder.  Below is the HPI for the current admission.  [name] is a 67 year old right-handed male with history of diabetes mellitus,seizure disorder maintained on Keppra CAD, polysubstance and alcohol abuse, diastolic congestive heart failure, COPD, chronically occluded right ICA, recent CVA with left-sided residual weakness maintained on aspirin and Plavix and was discharged to [facility] facility for 3 weeks before returning home. Per chart review patient lives at [facility]  senior apartments with assistance from a home health aide. He was using a walker while at home with noted falls.Presented 01/13/2018 with increasing weakness 2-3 days.UDS positive cocaine. MRI showed multifocal acute small right frontal MCA territory infarctions. Old right MCA territory infarcts. Cerebral angiogram again confirmed occluded right ICA stent without evidence of string sign and no plan for further intervention. Echocardiogram with ejection fraction of 55-60% without emboli.Chest x-ray with incidental finding of right upper lobe nodule plan follow-up outpatient. Neurology follow-up currently maintained on aspirin and Plavix for CVA prophylaxis. Subcutaneous Lovenox for DVT prophylaxis. Tolerating a regular consistency diet. Therapy evaluations completed with recommendations of physical medicine rehabilitation consult. Patient was admitted for a compressive rehabilitation program.  Current Status:  The patient showed significant lethargy and slowed information processing speed during my interactions today.  The patient had poor mental status overall but was able to remember past events and talked some about some of the therapies that he had been engaged with.  The patient denied any significant issues of depression or anxiety but does report that he deals with significant boredom due to his inability to ambulate very well and the fact that he gets "stuck in his apartment."  The patient reports that this boredom is a primary factor in his substance abuse although he admits that he has been using various substances much longer than he has had residual effects from any past strokes.  The patient had slurring of speech and dysarthria throughout.  He was able to adequately find the words that he wanted to say.  Executive  functioning was poor.  Behavioral Observation: [name]  presents as a 67 y.o.-year-old Right Caucasian Male who appeared his stated age. his dress was Appropriate and he was  Well Groomed and his manners were Appropriate to the situation.  his participation was indicative of Drowsy and Inattentive behaviors.  There were any physical disabilities noted.  he displayed an appropriate level of cooperation and motivation.     Interactions:    Minimal Appropriate, Drowsy and Inattentive  Attention:   abnormal and attention span appeared shorter than expected for age  Memory:   abnormal; remote memory intact, recent memory impaired  Visuo-spatial:  not examined  Speech (Volume):  low  Speech:   slurred; garbled  Thought Process:  Circumstantial  Though Content:  WNL; not suicidal and not homicidal  Orientation:   person, place and situation  Judgment:   Poor  Planning:   Poor  Affect:    Flat and Lethargic  Mood:    Dysphoric  Insight:   Shallow  Intelligence:   low   Medical History:   Past Medical History:  Diagnosis Date  . Alcohol abuse    H/O  . Anxiety   . Arthritis   . Asthma   . CAD (coronary artery disease) 05/27/10   Cath: severe single vessell CAD left cx midportion obtuse marginal 2 to 3.  [name] CHF (congestive heart failure) ([place])   . COPD (chronic obstructive pulmonary disease) ([place])   . COPD (chronic obstructive pulmonary disease) ([place])   . Depression   . Diabetes mellitus, type 2 ([place]) 03/13/2015  . GERD (gastroesophageal reflux disease)   . HCAP (healthcare-associated pneumonia) 10/15/2015  . History of DVT of lower extremity   . Hyperlipemia   . Hyperlipidemia   . Hypertension   . Myocardial infarction ([place]) 2000  . Orbital fracture 12/2012  . OSA on CPAP   . Peripheral vascular disease, unspecified ([place])    08/20/10 doppler: increase in right ABI post-op. Left ABI stable. S/P bi-fem bypass surgery  . Pneumonia 04/04/2012  . Shortness of breath   . Stroke ([place])   . Tobacco abuse     Psychiatric History:  The patient has a long history of anxiety and depression along with polysubstance abuse.  The patient attributes some  of his polysubstance abuse to self-medicating issues of anxiety and depression/boredom.  Family Med/Psych History:  Family History  Problem Relation Age of Onset  . Heart attack Mother   . Heart failure Mother   . Cirrhosis Father   . Heart failure Brother   . Cancer Brother   . Hypertension Neg Hx        UNKNOWN  . Stroke Neg Hx        UNKNOWN    Risk of Suicide/Violence: low the patient denies any active suicidal or homicidal ideation.  However, he has been passively harming himself not with the purposeful goal to harm himself but a lack of motivation for self-care.  Impression/DX:  [name] is a 67 year old male with history of diabetes, seizure disorder, CAD, polysubstance and alcohol abuse, CHF, COPD, chronically occluded right ICA with recent CVA with left-sided residual weakness.  Presented on 01/13/2018 with increased weakness over the past 2 to 3 days.  Urine drug screen was positive for cocaine.  MRI showed multifocal acute small right frontal MCA territory infarctions.  There were also old right MCA infarctions.    The patient acknowledged a long history of various substance abuse and alcohol  abuse.  He freely admitted to recent cocaine use prior to his most recent stroke.  The patient attributes his cocaine use to "boredom."  The patient showed significant lethargy and slowed information processing speed during my interactions today.  The patient had poor mental status overall but was able to remember past events and talked some about some of the therapies that he had been engaged with.  The patient denied any significant issues of depression or anxiety but does report that he deals with significant boredom due to his inability to ambulate very well and the fact that he gets "stuck in his apartment."  The patient reports that this boredom is a primary factor in his substance abuse although he admits that he has been using various substances much longer than he has had residual  effects from any past strokes.  The patient had slurring of speech and dysarthria throughout.  He was able to adequately find the words that he wanted to say.  Executive functioning was poor.   Disposition/Plan:  Today worked on issues related to his polysubstance abuse and lack of active attempts to reduce his risk of stroke.  Working on Radiographer, therapeutic and adjustment issues around loss of function from his previous strokes.  Diagnosis:    Poly Substance Abuse, Stroke        Electronically Signed   _______________________ Ilean Skill, Psy.D.

## 2018-01-26 NOTE — Progress Notes (Signed)
Physical Therapy Weekly Progress Note  Patient Details  Name: [name] MRN: [id number] Date of Birth: [birth year]  Beginning of progress report period: January 19, 2018 End of progress report period: January 26, 2018  Today's Date: 01/26/2018 PT Individual Time: 0900-0958 PT Individual Time Calculation (min): 58 min   Patient has met 4 of 4 short term goals. Pt is progressing with functional mobility, continues to be limited by orthostatic hypotension with standing activities and ambulation. Pt currently requires CGA-min assist for gait up to 100 ft with SPC.   Patient continues to demonstrate the following deficits muscle weakness, impaired timing and sequencing and decreased coordination, decreased attention and decreased safety awareness and decreased standing balance and decreased postural control and therefore will continue to benefit from skilled PT intervention to increase functional independence with mobility.  Patient progressing toward long term goals..  Continue plan of care.  PT Short Term Goals Week 1:  PT Short Term Goal 1 (Week 1): Patient to be able to perform all bed mobility with S  PT Short Term Goal 1 - Progress (Week 1): Met PT Short Term Goal 2 (Week 1): Patient to perform all functional bed to chair transfers with min guard  PT Short Term Goal 2 - Progress (Week 1): Met PT Short Term Goal 3 (Week 1): Patient to be able to ambulate at least 143f with LRAD and min guard  PT Short Term Goal 3 - Progress (Week 1): Met PT Short Term Goal 4 (Week 1): Patient to be able to ascend/descent single step curb with LRAD and MinA  PT Short Term Goal 4 - Progress (Week 1): Met Week 2:  PT Short Term Goal 1 (Week 2): STG=LTG due to ELOS  Skilled Therapeutic Interventions/Progress Updates:    Pt seated in w/c upon PT arrival, agreeable to therapy tx and denies pain. Pt transported to gym. Trial of RW this session for increased independence and safety with ambulation, pt  ambulated x 100 ft with RW, L hand splint and CGA, verbal cues for techniques. Pt reports he does not like using the RW. Pt ambulated x 100 ft with SPC and CGA. Therapist and pt [facility] be more feasible for ambulation within his apartment. Pt reports feeling light headed, BP in sitting 159/69. Pt performed sit<>stand and BP in standing 115/73, pt symptomatic and positive for orthostatic hypotension this session. This PT reported to RN. Pt transported to dayroom and performed stand pivot from w/c<>nustep with min assist, used nustep x 8 minutes on workload 5 for global strengthening and endurance. Therapist and pt discussing d/c planning during this and this therapist will follow up about getting pt's power chair charging cord replaced. Pt transported back to room and left seated in w/c with needs in reach and chair alarm set.   Therapy Documentation Precautions:  Precautions Precautions: Fall, Other (comment) Precaution Comments: L hemiparesis UE>LE, positive orthostatics- watch BP  Restrictions Weight Bearing Restrictions: No   Therapy/Group: Individual Therapy  ENetta Corrigan PT, DPT 01/26/2018, 7:54 AM

## 2018-01-26 NOTE — Patient Care Conference (Signed)
Inpatient RehabilitationTeam Conference and Plan of Care Update Date: 01/26/2018   Time: 11:20 AM    Patient Name: [name]      Medical Record Number: [id number]  Date of Birth: 1950-12-26 Sex: Male         Room/Bed: 4W25C/4W25C-01 Payor Info: Payor: [employer] MEDICARE / Plan: [employer] MEDICARE / Product Type: *No Product type* /    Admitting Diagnosis: cva  Admit Date/Time:  01/18/2018  3:08 PM Admission Comments: No comment available   Primary Diagnosis:  <principal problem not specified> Principal Problem: <principal problem not specified>  Patient Active Problem List   Diagnosis Date Noted  . Hypoalbuminemia due to protein-calorie malnutrition ([place])   . Pruritus   . Labile blood pressure   . Diabetes mellitus type 2 in obese ([place])   . Seizures ([place])   . Right middle cerebral artery stroke ([place]) 01/18/2018  . AMS (altered mental status) 11/29/2017  . Acute encephalopathy 11/28/2017  . Left shoulder pain 09/28/2017  . Cerebral thrombosis with cerebral infarction 07/18/2016  . Acute ischemic stroke ([place])   . Left hemiplegia ([place])   . Stroke-like symptoms 07/17/2016  . Insomnia 01/29/2016  . Depression 01/29/2016  . Alcohol abuse with intoxication ([place]) 10/16/2015  . Psoriasiform dermatitis 07/12/2015  . Dementia due to another medical condition ([place]) 07/12/2015  . Urinary incontinence 03/14/2015  . Type 2 diabetes mellitus with peripheral neuropathy ([place]) 03/13/2015  . Hemiparesis affecting left side as late effect of cerebrovascular accident ([name]) 02/18/2015  . Cerebrovascular accident (CVA) due to stenosis of right carotid artery ([place])   . Coronary artery disease involving native coronary artery of native heart without angina pectoris   . Tachypnea   . Thrombocytopenia ([place])   . History of stroke   . Stroke (cerebrum) ([place]) 02/06/2015  . Arterial ischemic stroke, MCA (middle cerebral artery), right, acute ([place])   . DOE (dyspnea on exertion) 11/23/2014  .  PAF (paroxysmal atrial fibrillation) ([place]) 11/23/2014  . HLD (hyperlipidemia) 11/01/2014  . Tobacco use disorder 11/01/2014  . Diastolic CHF, acute on chronic (HCC) 11/01/2014  . Cerebral infarction due to stenosis of right carotid artery ([place]) 11/01/2014  . Carotid stenosis 11/01/2014  . Hyperkalemia 10/24/2014  . H/O total knee replacement 10/24/2014  . Essential hypertension   . Polysubstance abuse ([place]) 08/28/2014  . Cocaine abuse ([place])   . Intracranial carotid stenosis   . Headache around the eyes 08/27/2014  . Acute CVA (cerebrovascular accident) ([place]) 08/27/2014  . CVA (cerebral vascular accident) ([place]) 08/26/2014  . History of DVT (deep vein thrombosis)   . Alcoholism ([place])   . Embolic stroke ([place])   . Cerebral infarction due to embolism of right middle cerebral artery ([place])   . Hypotension   . AKI (acute kidney injury) ([place])   . Stroke ([place])   . CVA (cerebral infarction) 08/01/2014  . Alcohol dependence with withdrawal with complication ([place]) 24/58/0998  . DVT (deep venous thrombosis) ([place]) 02/22/2014  . Lower extremity edema 02/21/2014  . Chronic diastolic congestive heart failure ([place]) 11/21/2013  . CAD (coronary artery disease) 11/21/2013  . Alcohol abuse 11/21/2013  . GERD (gastroesophageal reflux disease) 08/10/2013  . Gout 08/10/2013  . Tobacco abuse 07/26/2013  . Trichiasis without entropion 04/16/2013  . Dizziness 04/10/2013  . Eyelid lesion 01/24/2013  . Enophthalmos due to trauma 01/19/2013  . Medial orbital wall fracture 01/19/2013  . Closed blow-out fracture of floor of orbit ([place]) 01/19/2013  . Binocular vision disorder with diplopia 01/03/2013  .  Fracture of inferior orbital wall ([place]) 01/03/2013  . Fracture of orbital floor ([place]) 01/03/2013  . Pain of left eye 01/03/2013  . Peripheral vascular disease ([place]) 07/27/2012  . COPD (chronic obstructive pulmonary disease) ([place]) 04/04/2012  . HTN (hypertension) 04/04/2012    Expected Discharge Date:  Expected Discharge Date: 02/01/18  Team Members Present: Physician leading conference: Dr. Alysia Penna Social Worker Present: Ovidio Kin, LCSW Nurse Present: Rayetta Humphrey, RN PT Present: Michaelene Song, PT OT Present: Willeen Cass, OT SLP Present: Weston Anna, SLP PPS Coordinator present : Gunnar Fusi     Current Status/Progress Goal Weekly Team Focus  Medical   Orthostasis remains an issue  Reduce fall risk, maintain adequate perfusion  Continue rehab program, medication management   Bowel/Bladder   Remain continent of B.B, LBM 01/24/18  remain continent of B/B normal bowel function  qs assessment of toileting needs, address with laxatives and prn meds   Swallow/Nutrition/ Hydration             ADL's   functional transfers-CGA; bathing-min A; dressing-mod A; toileting-CGA  supervsion/min A overall; mod I toilet transfers and toileting  activity tolerance, BADL retraining, functional transfers, safety awareness   Mobility   CGA-min assist overall, household distance gait w/ SPC  supervision overall  OOB tolerance (limited by low BP), all functional mobility, endurance, balance   Communication             Safety/Cognition/ Behavioral Observations            Pain   no report of painor discomft  pt will have pain <+2/10  QS assessment and prn, provide medication as ordered and assess the effectiveness of medication   Skin   Scabbed excoriated areas healing well  pt free of skin breakdown excoriated areas healing without complication no s/sx of infection  QS assessment and prn       *See Care Plan and progress notes for long and short-term goals.     Barriers to Discharge  Current Status/Progress Possible Resolutions Date Resolved   Physician          Progressing slowly towards goals  Continue rehab, blood pressure medication management      Nursing                  PT  Lack of/limited family support  no family/friends in area to provide 24/7 supervision,  history of falls at home              OT                  SLP                SW                Discharge Planning/Teaching Needs:  Unsure if plans to go home with aide for 3 hrs or to puruse ALF again. He had a bad experience last time      Team Discussion:  Making good progress in his therapies. Still having BP issues with binder and ted hose. Spascity in leg last night interrupted sleep. Neuro-psych to see today. DC speech back to baseline. MD started zanaflex for tone and spascity. Working on endurance issues.  Revisions to Treatment Plan:  DC 12/24    Continued Need for Acute Rehabilitation Level of Care: The patient requires daily medical management by a physician with specialized training in physical medicine and rehabilitation for the following conditions: Daily direction  of a multidisciplinary physical rehabilitation program to ensure safe treatment while eliciting the highest outcome that is of practical value to the patient.: Yes Daily medical management of patient stability for increased activity during participation in an intensive rehabilitation regime.: Yes Daily analysis of laboratory values and/or radiology reports with any subsequent need for medication adjustment of medical intervention for : Neurological problems;Blood pressure problems   I attest that I was present, lead the team conference, and concur with the assessment and plan of the team.   Elease Hashimoto 01/26/2018, 1:15 PM

## 2018-01-26 NOTE — Progress Notes (Signed)
Physical Therapy Session Note  Patient Details  Name: [name] MRN: [id number] Date of Birth: [birth year]  Today's Date: 01/26/2018 PT Individual Time: 4830-7354 PT Individual Time Calculation (min): 38 min   Short Term Goals: Week 2:  PT Short Term Goal 1 (Week 2): STG=LTG due to ELOS  Skilled Therapeutic Interventions/Progress Updates:    Patient received asleep in bed but easily woken and willing to participate in PT, reports he is not feeling well and would like therapist to check his BP. Able to complete bed mobility with Min-ModA this afternoon with heavier assist level possibly due to fatigue, also required higher level of assist for sit to stand as well this afternoon. BP measures as follows, patient consistently symptomatic and symptoms resolved with return to supine:  Supine: 131/62 HR 70BPM Sitting: 99/64 HR 74BPM Sitting x3 minutes 113/67 HR 74BPM Standing 80/52 HR 85 BPM Did not assess standing for 3 minutes due to time limitations of session.   Returned patient to supine and dizziness/symptoms resolved; education performed on possible causes of orthostatics and ways patient can assist in managing including improving hydration per medical team and progressive elevation of HOB as long as he is symptom free (recommend dropping HOB back down if he starts to become dizzy/light headed).   Assisted him in using urinal and he was then left in bed with all needs met, bed alarm active this afternoon.   Therapy Documentation Precautions:  Precautions Precautions: Fall, Other (comment) Precaution Comments: L hemiparesis UE>LE, positive orthostatics- watch BP  Restrictions Weight Bearing Restrictions: No General:   Pain: Pain Assessment Pain Scale: 0-10 Pain Score: 0-No pain    Therapy/Group: Individual Therapy  Deniece Ree PT, DPT, CBIS  Supplemental Physical Therapist [facility]    Pager [phone] Acute Rehab Office [phone]   01/26/2018, 4:02  PM

## 2018-01-26 NOTE — Progress Notes (Signed)
Occupational Therapy Session Note  Patient Details  Name: [name] MRN: [id number] Date of Birth: 05/09/1950  Today's Date: 01/26/2018 OT Individual Time: 0700-0800 OT Individual Time Calculation (min): 3min    Short Term Goals: Week 2:  OT Short Term Goal 1 (Week 2): Pt will be able to complete stand pivot to toilet with close S. OT Short Term Goal 2 (Week 2): Pt will don shirt with (with A to lift L arm) S. OT Short Term Goal 3 (Week 2): Pt will perform 3/3 toileting tasks with CGA  Skilled Therapeutic Interventions/Progress Updates:    OT intervention with focus on bed mobility, functional transfers, sit<>stand, standing balance, bathing/dressing, activity tolerance, and safety awareness to increase independence with BADLs.  Pt elected to bathe at sink this morning.  Pt required min A for supine>sit EOB and CGA for squat pivot transfer to w/c.  Pt completed bathing/dressing tasks with sit<>stand from w/c at sink.  Pt requires assistance with bathing RUE and B feet.  Pt requires mod A for UB/LB dressing.  Pt is able to maintain standing balance with CGA for bathing buttocks and pulling pants over hips.  Pt requires assitance opening containers on breakfast tray.  Pt remained seated in w/c with all needs within reach and belt alarm activated.   Therapy Documentation Precautions:  Precautions Precautions: Fall, Other (comment) Precaution Comments: L hemiparesis UE>LE, positive orthostatics- watch BP  Restrictions Weight Bearing Restrictions: No Pain:     Therapy/Group: Individual Therapy  Leroy Libman 01/26/2018, 2:57 PM

## 2018-01-27 ENCOUNTER — Inpatient Hospital Stay (HOSPITAL_COMMUNITY): Payer: Self-pay

## 2018-01-27 ENCOUNTER — Inpatient Hospital Stay (HOSPITAL_COMMUNITY): Payer: Self-pay | Admitting: Physical Therapy

## 2018-01-27 ENCOUNTER — Inpatient Hospital Stay (HOSPITAL_COMMUNITY): Payer: Self-pay | Admitting: Occupational Therapy

## 2018-01-27 MED ORDER — DOCUSATE SODIUM 100 MG PO CAPS
200.0000 mg | ORAL_CAPSULE | Freq: Three times a day (TID) | ORAL | Status: DC
  Administered 2018-01-27 – 2018-02-04 (×25): 200 mg via ORAL
  Filled 2018-01-27 (×25): qty 2

## 2018-01-27 NOTE — Progress Notes (Signed)
Physical Therapy Session Note  Patient Details  Name: [name] MRN: [id number] Date of Birth: 1950/12/15  Today's Date: 01/27/2018 PT Individual Time: 0800-0858 PT Individual Time Calculation (min): 58 min   Short Term Goals: Week 2:  PT Short Term Goal 1 (Week 2): STG=LTG due to ELOS  Skilled Therapeutic Interventions/Progress Updates:   Pt in w/c and agreeable to therapy, denies pain but reports not feeling good today, BP 144/68 w/o abdominal binder on. Assisted w/ changing to new set of paper scrubs. Min assist to don on RUE/LE, pt able to pull over hips and pull shirt over head. Total assist w/c transport to/from therapy gym. BP 140/70 in sitting w/ abdominal binder on, 117/70 when standing and symptomatic. Deferred further standing activity for safety. NuStep 10 min @ level 3 for LE strengthening and global endurance training. Pt needed seated rest in between transitions/transfers 2/2 feeling "swimmy headed", NuStep was not symptom provoking however. Returned to room and ended session in w/c, all needs in reach.   Therapy Documentation Precautions:  Precautions Precautions: Fall, Other (comment) Precaution Comments: L hemiparesis UE>LE, positive orthostatics- watch BP  Restrictions Weight Bearing Restrictions: No Vital Signs: Therapy Vitals BP: (!) 144/68 Patient Position (if appropriate): Sitting Oxygen Therapy SpO2: 95 % O2 Device: Room Air   Therapy/Group: Individual Therapy  Geanie Pacifico K Sheba Whaling 01/27/2018, 9:01 AM

## 2018-01-27 NOTE — Progress Notes (Signed)
Occupational Therapy Session Note  Patient Details  Name: [name] MRN: [id number] Date of Birth: July 10, 1950  Today's Date: 01/27/2018 OT Individual Time: 1000-1035 OT Individual Time Calculation (min): 35 min  and Today's Date: 01/27/2018 OT Missed Time: 10 Minutes Missed Time Reason: Patient fatigue;Patient ill (comment)(pt feeling dizzy and unwell)   Short Term Goals: Week 2:  OT Short Term Goal 1 (Week 2): Pt will be able to complete stand pivot to toilet with close S. OT Short Term Goal 2 (Week 2): Pt will don shirt with (with A to lift L arm) S. OT Short Term Goal 3 (Week 2): Pt will perform 3/3 toileting tasks with CGA  Skilled Therapeutic Interventions/Progress Updates:    Pt received in w/c stating he was agreeable to therapy but he was not feeling well.  Once in the gym, he said he wanted to have his blood pressure checked as he was feeling dizzier.  It was low and pt felt he could not do much.  Spent time reviewing his medications, PMH, and spoke with his PA.  Pt states he has been taking a lot of medications for many years, but is not sure what he takes.  Pt taken back to room and completed stand pivot to bed with close S, close s to lay down in bed.  Pt set up with all needs and bed alarm set.   Therapy Documentation Precautions:  Precautions Precautions: Fall, Other (comment) Precaution Comments: L hemiparesis UE>LE, positive orthostatics- watch BP  Restrictions Weight Bearing Restrictions: No General: General OT Amount of Missed Time: 10 Minutes Vital Signs: Therapy Vitals BP: (!) 143/63 Patient Position (if appropriate): Sitting Oxygen Therapy SpO2: 95 % O2 Device: Room Air Pain: Pain Assessment Pain Score: 0-No pain   Therapy/Group: Individual Therapy  SAGUIER,JULIA 01/27/2018, 10:55 AM

## 2018-01-27 NOTE — Progress Notes (Signed)
[facility] PHYSICAL MEDICINE & REHABILITATION PROGRESS NOTE   Subjective/Complaints:  C/o hard stools and hemmorhoid pain  ROS: + Pruritus.  Denies CP, SOB, N/V/D  Objective:   No results found. No results for input(s): WBC, HGB, HCT, PLT in the last 72 hours. Recent Labs    01/25/18 0502  CREATININE 0.88    Intake/Output Summary (Last 24 hours) at 01/27/2018 0910 Last data filed at 01/27/2018 0830 Gross per 24 hour  Intake 680 ml  Output 1825 ml  Net -1145 ml     Physical Exam: Vital Signs Blood pressure (!) 144/68, pulse 64, temperature 97.8 F (36.6 C), resp. rate 17, height 6' (1.829 m), weight 98.2 kg, SpO2 95 %.  Constitutional: No distress . Vital signs reviewed. HENT: Normocephalic.  Atraumatic. Eyes: EOMI. No discharge. Cardiovascular: RRR.  No JVD. Respiratory: CTA bilaterally.  Normal effort. GI: BS +. Non-distended. Musc: No edema or tenderness in extremities. Skin: Scattered excoriations Neurologic: Alert. Motor: 5/5 in right  deltoid, bicep, tricep, grip, hip flexor, knee extensors, ankle dorsiflexor and plantar flexor Left upper extremity: Shoulder abduction, elbow flexion/extension 2+/5, wrist extension, handgrip 1/5 Left lower extremity: Hip flexion, knee extension 4/5, ankle dorsiflexion 3-/5, stable No clonus at knee , few beats at Left ankle Assessment/Plan: 1. Functional deficits secondary to RIght MCA infarct  which require 3+ hours per day of interdisciplinary therapy in a comprehensive inpatient rehab setting.  Physiatrist is providing close team supervision and 24 hour management of active medical problems listed below.  Physiatrist and rehab team continue to assess barriers to discharge/monitor patient progress toward functional and medical goals  Care Tool:  Bathing    Body parts bathed by patient: Left arm, Chest, Abdomen, Front perineal area, Buttocks, Right upper leg, Left upper leg, Face, Right lower leg   Body parts bathed by  helper: Right arm, Left lower leg     Bathing assist Assist Level: Minimal Assistance - Patient > 75%     Upper Body Dressing/Undressing Upper body dressing   What is the patient wearing?: Pull over shirt    Upper body assist Assist Level: Minimal Assistance - Patient > 75%    Lower Body Dressing/Undressing Lower body dressing      What is the patient wearing?: Pants     Lower body assist Assist for lower body dressing: Moderate Assistance - Patient 50 - 74%     Toileting Toileting    Toileting assist Assist for toileting: Contact Guard/Touching assist     Transfers Chair/bed transfer  Transfers assist     Chair/bed transfer assist level: Contact Guard/Touching assist     Locomotion Ambulation   Ambulation assist      Assist level: Contact Guard/Touching assist Assistive device: Cane-straight Max distance: 100'   Walk 10 feet activity   Assist     Assist level: Contact Guard/Touching assist Assistive device: Cane-straight   Walk 50 feet activity   Assist    Assist level: Contact Guard/Touching assist Assistive device: Cane-straight    Walk 150 feet activity   Assist Walk 150 feet activity did not occur: Safety/medical concerns(medical- felt like he was going to pass out )         Walk 10 feet on uneven surface  activity   Assist Walk 10 feet on uneven surfaces activity did not occur: Safety/medical concerns(medical- felt like he was going to pass out )         Wheelchair     Assist Will patient use wheelchair at  discharge?: Yes Type of Wheelchair: Manual    Wheelchair assist level: Supervision/Verbal cueing Max wheelchair distance: 30ft     Wheelchair 50 feet with 2 turns activity    Assist        Assist Level: Supervision/Verbal cueing   Wheelchair 150 feet activity     Assist Wheelchair 150 feet activity did not occur: Safety/medical concerns(fatigue )        Medical Problem List and  Plan: 1.Left hemiparesissecondary to Right MCA small embolic infarcts due to chronic right ICA occlusion in the setting of cocaine abuse and suspect medical noncompliance   Cont CIR PT, OT LLE shaking likely spasticity zanaflex at noc 2. DVT Prophylaxis/Anticoagulation: Subcutaneous Lovenox. Monitor for any bleeding episodes 3. Pain Management:Lyrica 150 mg twice a day  -robaxin prn for leg spasms 4. Mood:Namenda 10 mg twice a day, Cymbalta 60 mg daily 5. Neuropsych: This patientis?  Fully capable of making decisions on hisown behalf.  6. Skin/Wound Care:Routine skin checks 7. Fluids/Electrolytes/Nutrition:Routine and out's  Creatinine within normal limits on 12/17 8. Seizure disorder. Keppra 500 mg twice a day 9. History of polysubstance abuse / abuse tobacco abuse. UDS positive for cocaine. Provide counseling. Continue NicoDerm patch 10. Diabetes mellitus with peripheral neuropathy.  Diet controlled 11. Diastolic congestive heart failure. Monitor for any signs of fluid overload Daily weights fluctuating- low fluid intake so lasix cut back monitor for trend Filed Weights   01/25/18 0500 01/26/18 0415 01/27/18 0432  Weight: 93.9 kg 97.3 kg 98.2 kg   12.  Hypertension Vitals:   01/27/18 0432 01/27/18 0805  BP: (!) 114/59 (!) 144/68  Pulse: 64   Resp: 17   Temp: 97.8 F (36.6 C)   SpO2: 93% 95%  controlled 12/19 BPs have been on low side will reduce Lasix to 40mg  Qd, consider reduction of coreg if pt becomes bradycardic 13. Hyperlipidemia. Lipitor 14. History of gout. Zyloprim 300 mg daily 15. Constipation. Laxative assistance  Stool softener increased on 12/17- increase again as pt having hemmorhoid with hard stools 16.  Sleep disturbance: Improved on Ambien 5 mg, monitor for confusion 17.  Pruritus- improved  Atarax scheduled, increased on 12/17 18.  Hypoalbuminemia  Supplement initiated on 12/16  LOS: 9 days A FACE TO FACE EVALUATION WAS  PERFORMED  Charlett Blake 01/27/2018, 9:10 AM

## 2018-01-27 NOTE — Progress Notes (Signed)
Occupational Therapy Session Note  Patient Details  Name: MILLIE SHORB MRN: [id number] Date of Birth: 1950/08/29  Today's Date: 01/27/2018 OT Individual Time: 8099-8338 OT Individual Time Calculation (min): 28 min    Short Term Goals: Week 2:  OT Short Term Goal 1 (Week 2): Pt will be able to complete stand pivot to toilet with close S. OT Short Term Goal 2 (Week 2): Pt will don shirt with (with A to lift L arm) S. OT Short Term Goal 3 (Week 2): Pt will perform 3/3 toileting tasks with CGA  Skilled Therapeutic Interventions/Progress Updates:    Pt resting in bed upon arrival, stating that he was still "worn out" from morning BM episode.  Pt willing to participate in therapy sitting EOB.  Pt wanted to change his shirt and agreed to sit EOB to complete task. Pt required min A for supine>sit EOB with HOB elevated.  Pt required max A for doffing paper scrub shirt and min A for donning polo style shirt.  Pt also engaged in sit<>stand from EOB X 5 with CGA.  Pt returned to bed and remained in bed with all needs within reach.  Bed alarm activated.   Therapy Documentation Precautions:  Precautions Precautions: Fall, Other (comment) Precaution Comments: L hemiparesis UE>LE, positive orthostatics- watch BP  Restrictions Weight Bearing Restrictions: No Pain: Pt denies pain but c/o generalized discomfort from morning BM event; repositioned Therapy/Group: Individual Therapy  Leroy Libman 01/27/2018, 2:29 PM

## 2018-01-27 NOTE — Progress Notes (Signed)
Occupational Therapy Session Note  Patient Details  Name: [name] MRN: [id number] Date of Birth: 08-21-1950  Today's Date: 01/27/2018 OT Individual Time: 0700-0800 OT Individual Time Calculation (min): 60 min    Short Term Goals: Week 2:  OT Short Term Goal 1 (Week 2): Pt will be able to complete stand pivot to toilet with close S. OT Short Term Goal 2 (Week 2): Pt will don shirt with (with A to lift L arm) S. OT Short Term Goal 3 (Week 2): Pt will perform 3/3 toileting tasks with CGA  Skilled Therapeutic Interventions/Progress Updates:    Pt resting in bed upon arrival and agreeable to taking a shower.  Pt amb with SPC to bathroom with CGA to use toilet prior to shower.  Pt with difficulty having BM and RN notified.  RN assisted pt with BM.  Pt c/o slight dizziness during toileting.  Pt transferred to w/c to return to room and completed grooming tasks at sink.  Pt stated that he was worn out from episode in bathroom.  Educated pt on importance of drinking fluids, particularly water and eating balance meals.  Pt stated he has the same problems at home.  Pt returned to bed and remained in bed with all needs within reach and bed alarm activated.   Therapy Documentation Precautions:  Precautions Precautions: Fall, Other (comment) Precaution Comments: L hemiparesis UE>LE, positive orthostatics- watch BP  Restrictions Weight Bearing Restrictions: No Pain:  7/10 rectal pain during BM; RN aware and attended to pt   Therapy/Group: Individual Therapy  Leroy Libman 01/27/2018, 8:02 AM

## 2018-01-28 ENCOUNTER — Inpatient Hospital Stay (HOSPITAL_COMMUNITY): Payer: Self-pay

## 2018-01-28 ENCOUNTER — Inpatient Hospital Stay (HOSPITAL_COMMUNITY): Payer: Self-pay | Admitting: Physical Therapy

## 2018-01-28 MED ORDER — SODIUM CHLORIDE 0.45 % IV SOLN
INTRAVENOUS | Status: DC
  Administered 2018-01-28 – 2018-01-29 (×2): via INTRAVENOUS

## 2018-01-28 MED ORDER — SODIUM CHLORIDE 0.45 % IV SOLN
INTRAVENOUS | Status: DC
  Administered 2018-01-28: 14:00:00 via INTRAVENOUS

## 2018-01-28 MED ORDER — CARVEDILOL 6.25 MG PO TABS
6.2500 mg | ORAL_TABLET | Freq: Two times a day (BID) | ORAL | Status: DC
  Administered 2018-01-28 – 2018-02-01 (×8): 6.25 mg via ORAL
  Filled 2018-01-28 (×8): qty 1

## 2018-01-28 NOTE — Progress Notes (Signed)
The following Verbal Orders received per APP D.A: Initiate 0.45% Sodium Chloride Infusion @ 75 mL/hr via IV STAT.

## 2018-01-28 NOTE — Progress Notes (Addendum)
Physical Therapy Session Note  Patient Details  Name: GAD AYMOND MRN: [id number] Date of Birth: [birth year]  Today's Date: 01/28/2018 PT Individual Time: [phone] AND 1630-1640 (make-up time)  PT Individual Time Calculation (min): 15 min AND 10 min  and Today's Date: 01/28/2018 PT Missed Time: 14 Minutes Missed Time Reason: MD hold (Comment)(Verbal order from MD for pt to rest in bed to see if possible TIA symptoms resolve)  Short Term Goals: Week 2:  PT Short Term Goal 1 (Week 2): STG=LTG due to ELOS  Skilled Therapeutic Interventions/Progress Updates:   Session 1:  Pt in supine and agreeable to therapy, denies pain. Per OT, pt symptomatic w/ OOB activity this morning and BP was fluctuating. BP in supine 108/56, nonsymptomatic. Transferred to EOB w/ abdominal binder and TEDs donned, BP 1332/56 and pt c/o R facial numbness. MD present at this time and made aware. Per verbal request from MD, pt to return to supine to see if possible TIA symptoms resolve. Will make other treating therapists aware. Ended session in supine, all needs in reach.   Session 2:  Pt in supine and agreeable to make-up session, no c/o pain and states he feels better than this morning. Pt connected to IV fluids, denied dizziness in propped supine position. CGA transfer to EOB (binder and TEDs already donned), mild dizziness and BP 113/66. Dizziness decreased w/ a few minutes of rest prior to stand pivot transfer to w/c. BP 116/53 after transfer, dizziness continuing to decrease w/ back support while seated in w/c. Pt requesting to attempt to stay in w/c for dinner, cautioned to let RN know if he was starting to feel an increase in dizziness. RN made aware and agreeable. Ended session in w/c and all needs in reach.   Therapy Documentation Precautions:  Precautions Precautions: Fall, Other (comment) Precaution Comments: L hemiparesis UE>LE, positive orthostatics- watch BP  Restrictions Weight Bearing  Restrictions: No General: PT Amount of Missed Time (min): 45 Minutes PT Missed Treatment Reason: MD hold (Comment)(Verbal order from MD for pt to rest in bed to see if possible TIA symptoms resolve) Vital Signs: Therapy Vitals Temp: (!) 97.5 F (36.4 C) Temp Source: Oral Pulse Rate: 60 Resp: 16 BP: (P) 132/66 Patient Position (if appropriate): (P) Sitting Oxygen Therapy SpO2: 95 % O2 Device: Room Air Pain: Pain Assessment Pain Scale: 0-10 Pain Score: 0-No pain  Therapy/Group: Individual Therapy  Kunal Levario Clent Demark 01/28/2018, 9:44 AM

## 2018-01-28 NOTE — Plan of Care (Signed)
  Problem: Consults Goal: RH STROKE PATIENT EDUCATION Description See Patient Education module for education specifics  Outcome: Progressing   Problem: RH SKIN INTEGRITY Goal: RH STG SKIN FREE OF INFECTION/BREAKDOWN Description No new skin breakdown with min assist    Outcome: Progressing   Problem: RH SAFETY Goal: RH STG ADHERE TO SAFETY PRECAUTIONS W/ASSISTANCE/DEVICE Description STG Adhere to Safety Precautions With Mod I Assistance/Device.  Outcome: Progressing   Problem: RH PAIN MANAGEMENT Goal: RH STG PAIN MANAGED AT OR BELOW PT'S PAIN GOAL Description < 3 out of 10.   Outcome: Progressing   Problem: RH KNOWLEDGE DEFICIT Goal: RH STG INCREASE KNOWLEDGE OF DIABETES Description Pt will be able to verbalize 2 ways he can manage his blood sugar at home at the time of discharge.   Outcome: Progressing Goal: RH STG INCREASE KNOWLEDGE OF HYPERTENSION Description Pt will be able to verbalize 2 ways to manage his blood pressure with min cues at the time of discharge.   Outcome: Progressing

## 2018-01-28 NOTE — Progress Notes (Signed)
Occupational Therapy Session Note  Patient Details  Name: [name] MRN: [id number] Date of Birth: 04-Jun-1950  Today's Date: 01/28/2018 OT Individual Time: 0700-0800 OT Individual Time Calculation (min): 60 min    Short Term Goals: Week 2:  OT Short Term Goal 1 (Week 2): Pt will be able to complete stand pivot to toilet with close S. OT Short Term Goal 2 (Week 2): Pt will don shirt with (with A to lift L arm) S. OT Short Term Goal 3 (Week 2): Pt will perform 3/3 toileting tasks with CGA  Skilled Therapeutic Interventions/Progress Updates:    Pt resting in bed upon arrival.  Attempted to engaged in BADL retraining, functional amb with SPC, and activity tolerance.  Pt c/o dizziness upon sitting EOB and instructed to return to supine.  BP readings: supine-158/63, sitting-116/57 without Ted hose. Supine with Ted hose-171/71, seated with Ted hose-123/65, seated with Ted hose and Abdominal binder-131/63.  Pt also c/o visual disturbances.  Pt returned to supine with HOB elevated to eat breakfast.  RN and PA notified.   Therapy Documentation Precautions:  Precautions Precautions: Fall, Other (comment) Precaution Comments: L hemiparesis UE>LE, positive orthostatics- watch BP  Restrictions Weight Bearing Restrictions: No Pain: Pain Assessment Pain Scale: 0-10 Pain Score: 0-No pain   Therapy/Group: Individual Therapy  Leroy Libman 01/28/2018, 12:13 PM

## 2018-01-28 NOTE — Progress Notes (Signed)
Social Work Patient ID: [name], male   DOB: [birth year], 67 y.o.   MRN: [id number] Met with pt to discuss dsicharge plans he is aware the team recommends 24 hr supervision and the options available to him. He refuses ALF he has been to one and will not go back. He would rather go home and see if he can manage with himself and his adie for the few hours a day he has her. Will prepare him for home on Tuesday

## 2018-01-28 NOTE — Progress Notes (Signed)
Physical Therapy Session Note  Patient Details  Name: [name] MRN: [id number] Date of Birth: 23-May-1950  Today's Date: 01/28/2018 PT Individual Time: 1300-1340 PT Individual Time Calculation (min): 40 min   Short Term Goals: Week 2:  PT Short Term Goal 1 (Week 2): STG=LTG due to ELOS  Skilled Therapeutic Interventions/Progress Updates:    Pt supine in bed upon PT arrival, agreeable to therapy tx and denies pain. Thigh high teds and abdominal binder already donned. Pt transferred to sitting EOB with supervision, therapist checked BP in sitting 100/58 with pt symptomatic. Pt seated EOB x 4 minutes while eating ice cream with supervision for seated balance, therapist checked BP in sitting again 107/67. Pt performed sit<>stand with supervision, BP in standing 84/60 with pt symptomatic. Deferred OOB exercises this session secondary to orthostaic hypotension. Pt also reports ringing in ears and numbness in head "like I've been hit in the head with a shovel." Pt performed seated LE exercises while EOB including 2 x 10 each: LAQ, hip flexion, hip abduction, ankle pumps. Pt transferred to supine and performed 2 x 10 heel slides, bridges and SAQ. Pt left supine in bed at end of session with needs in reach and bed alarm set.   Therapy Documentation Precautions:  Precautions Precautions: Fall, Other (comment) Precaution Comments: L hemiparesis UE>LE, positive orthostatics- watch BP  Restrictions Weight Bearing Restrictions: No   Therapy/Group: Individual Therapy  Netta Corrigan, PT, DPT 01/28/2018, 7:19 AM

## 2018-01-28 NOTE — Progress Notes (Signed)
Physical Therapy Session Note  Patient Details  Name: [name] MRN: [id number] Date of Birth: 07/15/1950  Today's Date: 01/28/2018 PT Individual Time: 1046-1105  PT Individual Time Calculation (min): 19 min   Short Term Goals: Week 2:  PT Short Term Goal 1 (Week 2): STG=LTG due to ELOS  Skilled Therapeutic Interventions/Progress Updates:  Treatment 1: PA Linna Hoff) verbally cleared pt for participation in therapy. Pt received in bed & agreeable to tx. No c/o pain reported. Pt reports feeling "a little bit swimmy headed" that goes across R eye & forehead. Pt transfers to sitting EOB with bed rails, extra time & effort and light min assist to obtain balance sitting EOB but then able to sit with distant supervision. Sitting EOB pt reports dizziness & a black spot in R eye vision. Pt returned to supine with supervision and reports vision going black in both eyes then clearing up but remaining blurry. Pt left in bed with alarm set, needs at hand, and encouraged to drink water. PA Linna Hoff) notified of pt's BP & symptomatic complaints.   Supine: BP = 140/60 mmHg, HR = 61 bpm, ted hose donned Sitting EOB with abdominal binder & teds donned: BP = 130/69 mmHg, HR = 68 bpm -- pt symptomatic  After returning supine, teds & binder still donned: BP = 146/67 mmHg (LUE), HR = 61 bpm   Treatment 2: Attempted to see pt for makeup time but pt reports feeling lightheaded supine in bed at rest and declining participation at this time. Provided pt with bottled water to encourage fluid intake as he reports he does not like tap water. Pt left in bed with alarm set & needs at hand.  Therapy Documentation Precautions:  Precautions Precautions: Fall, Other (comment) Precaution Comments: L hemiparesis UE>LE, positive orthostatics- watch BP  Restrictions Weight Bearing Restrictions: No General: PT Amount of Missed Time (min): 26 Minutes + 45 minutes (pt feeling unwell) PT Missed Treatment Reason:  (medical)   Therapy/Group: Individual Therapy  [name] 01/28/2018, 2:51 PM

## 2018-01-28 NOTE — Progress Notes (Signed)
Neurology consulted in regards to orthostasis as well as new onset right facial numbness.DrXUfollow-up no recommendations at this time for further imaging continue dual antiplatelet agents. Recommendations at this time to hold Lasix and continue Coreg. IV fluids initiated monitor for any signs of fluid overload.

## 2018-01-28 NOTE — Progress Notes (Signed)
[place] PHYSICAL MEDICINE & REHABILITATION PROGRESS NOTE   Subjective/Complaints: Tingling noted on Right side of face .  Started when pt was getting ortho vitals checked by PT, systolic was 716, pt placed in supine position , BP rechecked at 967 systolic but symptoms persisted.  No CP or SOB, no new swallowing issues or UE/LE numbness weakness  ROS: + Pruritus.  Denies CP, SOB, N/V/D  Objective:   No results found. No results for input(s): WBC, HGB, HCT, PLT in the last 72 hours. No results for input(s): NA, K, CL, CO2, GLUCOSE, BUN, CREATININE, CALCIUM in the last 72 hours.  Intake/Output Summary (Last 24 hours) at 01/28/2018 1223 Last data filed at 01/28/2018 0857 Gross per 24 hour  Intake 480 ml  Output 1850 ml  Net -1370 ml     Physical Exam: Vital Signs Blood pressure (!) 146/67, pulse 61, temperature (!) 97.5 F (36.4 C), temperature source Oral, resp. rate 16, height 6' (1.829 m), weight 96.2 kg, SpO2 95 %.  Constitutional: No distress . Vital signs reviewed. HENT: Normocephalic.  Atraumatic. Eyes: EOMI. No discharge. Cardiovascular: occ pauses.  No JVD. Respiratory: CTA bilaterally.  Normal effort. GI: BS +. Non-distended. Musc: No edema or tenderness in extremities. Skin: Scattered excoriations Neurologic: Alert. Motor: 5/5 in right  deltoid, bicep, tricep, grip, hip flexor, knee extensors, ankle dorsiflexor and plantar flexor Sensation reduced pinprick RIght V1, V2 Left upper extremity: Shoulder abduction, elbow flexion/extension 2+/5, wrist extension, handgrip 1/5 Left lower extremity: Hip flexion, knee extension 4/5, ankle dorsiflexion 3-/5, stable No clonus at knee , few beats at Left ankle Assessment/Plan: 1. Functional deficits secondary to RIght MCA infarct  which require 3+ hours per day of interdisciplinary therapy in a comprehensive inpatient rehab setting.  Physiatrist is providing close team supervision and 24 hour management of active medical  problems listed below.  Physiatrist and rehab team continue to assess barriers to discharge/monitor patient progress toward functional and medical goals  Care Tool:  Bathing    Body parts bathed by patient: Left arm, Chest, Abdomen, Front perineal area, Buttocks, Right upper leg, Left upper leg, Face, Right lower leg   Body parts bathed by helper: Right arm, Left lower leg     Bathing assist Assist Level: Minimal Assistance - Patient > 75%     Upper Body Dressing/Undressing Upper body dressing   What is the patient wearing?: Pull over shirt    Upper body assist Assist Level: Minimal Assistance - Patient > 75%    Lower Body Dressing/Undressing Lower body dressing      What is the patient wearing?: Pants     Lower body assist Assist for lower body dressing: Moderate Assistance - Patient 50 - 74%     Toileting Toileting    Toileting assist Assist for toileting: Contact Guard/Touching assist     Transfers Chair/bed transfer  Transfers assist     Chair/bed transfer assist level: Contact Guard/Touching assist     Locomotion Ambulation   Ambulation assist      Assist level: Contact Guard/Touching assist Assistive device: Cane-straight Max distance: 100'   Walk 10 feet activity   Assist     Assist level: Contact Guard/Touching assist Assistive device: Cane-straight   Walk 50 feet activity   Assist    Assist level: Contact Guard/Touching assist Assistive device: Cane-straight    Walk 150 feet activity   Assist Walk 150 feet activity did not occur: Safety/medical concerns(medical- felt like he was going to pass out )  Walk 10 feet on uneven surface  activity   Assist Walk 10 feet on uneven surfaces activity did not occur: Safety/medical concerns(medical- felt like he was going to pass out )         Wheelchair     Assist Will patient use wheelchair at discharge?: Yes Type of Wheelchair: Manual    Wheelchair assist  level: Supervision/Verbal cueing Max wheelchair distance: 20ft     Wheelchair 50 feet with 2 turns activity    Assist        Assist Level: Supervision/Verbal cueing   Wheelchair 150 feet activity     Assist Wheelchair 150 feet activity did not occur: Safety/medical concerns(fatigue )        Medical Problem List and Plan: 1.Left hemiparesissecondary to Right MCA small embolic infarcts due to chronic right ICA occlusion in the setting of cocaine abuse and suspect medical noncompliance   Cont CIR PT, OT New onset Right facial numbness on dual antiplatelet agent, HR irregular,  EKG NSR no new changes vs 12/8, neuro checks . Contact Neurology 2. DVT Prophylaxis/Anticoagulation: Subcutaneous Lovenox. Monitor for any bleeding episodes 3. Pain Management:Lyrica 150 mg twice a day  -robaxin prn for leg spasms 4. Mood:Namenda 10 mg twice a day, Cymbalta 60 mg daily 5. Neuropsych: This patientis?  Fully capable of making decisions on hisown behalf.  6. Skin/Wound Care:Routine skin checks 7. Fluids/Electrolytes/Nutrition:Routine and out's  Creatinine within normal limits on 12/17 8. Seizure disorder. Keppra 500 mg twice a day 9. History of polysubstance abuse / abuse tobacco abuse. UDS positive for cocaine. Provide counseling. Continue NicoDerm patch 10. Diabetes mellitus with peripheral neuropathy.  Diet controlled 11. Diastolic congestive heart failure. Monitor for any signs of fluid overload Daily weights fluctuating- low fluid intake so lasix cut back monitor for trend Filed Weights   01/26/18 0415 01/27/18 0432 01/28/18 0700  Weight: 97.3 kg 98.2 kg 96.2 kg   12.  Hypertension Vitals:   01/28/18 1059 01/28/18 1103  BP: 130/69 (!) 146/67  Pulse: 68 61  Resp:    Temp:    SpO2:    controlled 12/19 BPs have been on low side will reduce Lasix to 40mg  Qd,  reduction of coreg given orthostatic symptoms 13. Hyperlipidemia. Lipitor 14. History of  gout. Zyloprim 300 mg daily 15. Constipation. Laxative assistance  Stool softener increased on 12/19  as pt having hemmorhoid with hard stools 16.  Sleep disturbance: Improved on Ambien 5 mg, monitor for confusion 17.  Pruritus- improved  Atarax scheduled, increased on 12/17 18.  Hypoalbuminemia  Supplement initiated on 12/16  LOS: 10 days A FACE TO FACE EVALUATION WAS PERFORMED  Charlett Blake 01/28/2018, 12:23 PM

## 2018-01-29 ENCOUNTER — Inpatient Hospital Stay (HOSPITAL_COMMUNITY): Payer: Self-pay

## 2018-01-29 NOTE — Progress Notes (Signed)
Physical Therapy Session Note  Patient Details  Name: [name] MRN: [id number] Date of Birth: 1950-10-13  Today's Date: 01/29/2018 PT Individual Time: 1015-1100 PT Individual Time Calculation (min): 45 min   Short Term Goals: Week 2:  PT Short Term Goal 1 (Week 2): STG=LTG due to ELOS  Skilled Therapeutic Interventions/Progress Updates:    Pt supine in bed upon PT arrival, agreeable to therapy tx and denies pain. Pt reports he spilled his urinal on himself and is soiled, requesting to wash up. Pt transferred to sitting EOB with min assist and performed bedside bath with washclothes, supervision to wash front, LEs and UEs, min assist to wash back. Pt performed sit<>stand with min assist, pulling pants over hips. Pt seated EOB performed upper body dressing with min assist for L UE management. Pt's BP while sitting this session 125/72. Therapist donned abdominal binder and thigh high teds total assist. Pt performed stand pivot to w/c with min assist, BP sitting in w/c 117/62. Pt worked on upright activity tolerance by staying up in w/c this session, therapist provided pt with coffee per pt request. Pt left seated in w/c at end of session with needs in reach and chair alarm set.   Therapy Documentation Precautions:  Precautions Precautions: Fall, Other (comment) Precaution Comments: L hemiparesis UE>LE, positive orthostatics- watch BP  Restrictions Weight Bearing Restrictions: No    Therapy/Group: Individual Therapy  Netta Corrigan, PT, DPT 01/29/2018, 7:57 AM

## 2018-01-29 NOTE — Progress Notes (Signed)
On call provider was called for clarification of order for 1/2 NS & BP reading. Order was held & pt to be re-assesed in the AM.

## 2018-01-29 NOTE — Plan of Care (Signed)
  Problem: Consults Goal: RH STROKE PATIENT EDUCATION Description See Patient Education module for education specifics  Outcome: Progressing   Problem: RH SKIN INTEGRITY Goal: RH STG SKIN FREE OF INFECTION/BREAKDOWN Description No new skin breakdown with min assist    Outcome: Progressing   Problem: RH SAFETY Goal: RH STG ADHERE TO SAFETY PRECAUTIONS W/ASSISTANCE/DEVICE Description STG Adhere to Safety Precautions With Mod I Assistance/Device.  Outcome: Progressing   Problem: RH PAIN MANAGEMENT Goal: RH STG PAIN MANAGED AT OR BELOW PT'S PAIN GOAL Description < 3 out of 10.   Outcome: Progressing   Problem: RH KNOWLEDGE DEFICIT Goal: RH STG INCREASE KNOWLEDGE OF DIABETES Description Pt will be able to verbalize 2 ways he can manage his blood sugar at home at the time of discharge.   Outcome: Progressing Goal: RH STG INCREASE KNOWLEDGE OF HYPERTENSION Description Pt will be able to verbalize 2 ways to manage his blood pressure with min cues at the time of discharge.   Outcome: Progressing

## 2018-01-29 NOTE — Progress Notes (Signed)
[name] is a 67 y.o. male December 29, 1950 [id number]  Subjective: Is complaining of sore throat on the left starting last night. Slept well. Feeling OK.  Objective: Vital signs in last 24 hours: Temp:  [97.6 F (36.4 C)-98.3 F (36.8 C)] 97.7 F (36.5 C) (12/21 1257) Pulse Rate:  [67-83] 67 (12/21 1257) Resp:  [14-20] 20 (12/21 1257) BP: (112-134)/(61-64) 132/64 (12/21 1257) SpO2:  [90 %-98 %] 98 % (12/21 1257) Weight:  [96.2 kg] 96.2 kg (12/21 8088) Weight change: 0 kg Last BM Date: 01/27/18  Intake/Output from previous day: 12/20 0701 - 12/21 0700 In: 1500 [P.O.:600; I.V.:900] Out: 2440 [Urine:2440] Last cbgs: CBG (last 3)  No results for input(s): GLUCAP in the last 72 hours.   Physical Exam General: No apparent distress   HEENT: not dry.  No throat pathology was observed.  There is some mild enlarged, sensitive lymph nodes under the jaw on the left. Lungs: Normal effort. Lungs clear to auscultation, no crackles or wheezes. Cardiovascular: Regular rate and rhythm, no edema Abdomen: S/NT/ND; BS(+) Musculoskeletal:  unchanged Neurological: No new neurological deficits Wounds: N/A    Skin: clear  Aging changes Mental state: Alert, oriented, cooperative    Lab Results: BMET    Component Value Date/Time   NA 145 01/19/2018 0655   NA 145 (H) 08/19/2017 0952   K 4.6 01/19/2018 0655   CL 108 01/19/2018 0655   CO2 29 01/19/2018 0655   GLUCOSE 106 (H) 01/19/2018 0655   BUN 18 01/19/2018 0655   BUN 14 08/19/2017 0952   CREATININE 0.88 01/25/2018 0502   CREATININE CANCELED 04/28/2016 1515   CALCIUM 9.6 01/19/2018 0655   GFRNONAA >60 01/25/2018 0502   GFRNONAA CANCELED 04/28/2016 1515   GFRAA >60 01/25/2018 0502   GFRAA CANCELED 04/28/2016 1515   CBC    Component Value Date/Time   WBC 4.8 01/19/2018 0655   RBC 4.56 01/19/2018 0655   HGB 12.2 (L) 01/19/2018 0655   HCT 41.3 01/19/2018 0655   PLT 176 01/19/2018 0655   MCV 90.6 01/19/2018 0655   MCH 26.8  01/19/2018 0655   MCHC 29.5 (L) 01/19/2018 0655   RDW 14.0 01/19/2018 0655   LYMPHSABS 1.4 01/19/2018 0655   MONOABS 0.6 01/19/2018 0655   EOSABS 0.4 01/19/2018 0655   BASOSABS 0.1 01/19/2018 0655    Studies/Results: No results found.  Medications: I have reviewed the patient's current medications.  Assessment/Plan:    1.  Right MCA small embolic infarcts due to chronic right ICA occlusion in the setting of cocaine abuse and suspect medical noncompliance.  Left hemiparesis.  Continue with CIR 2.  DVT prophylaxis with Lovenox 3.  Pain management with Lyrica.  Robaxin for spasms as needed 4.  Mood: Namenda and Cymbalta 5.  Seizure disorder.  On Keppra 6.  Polysubstance abuse.  UDS positive positive for cocaine.  Tobacco smoking 7.  Type 2 diabetes, diet controlled 8.  Diastolic congestive heart failure. Lasix was reduced. 9.  Dyslipidemia.  On Lipitor 10.  Gout.  On Zyloprim 11.  Pruritus.  Improved on Atarax 12.  Insomnia.  Ambien PRN 13.  Sore throat on the left.  Will recheck tomorrow      Length of stay, days: [place] , MD 01/29/2018, 3:55 PM

## 2018-01-30 ENCOUNTER — Inpatient Hospital Stay (HOSPITAL_COMMUNITY): Payer: Self-pay

## 2018-01-30 MED ORDER — CEPASTAT 14.5 MG MT LOZG
1.0000 | LOZENGE | OROMUCOSAL | Status: DC | PRN
  Filled 2018-01-30: qty 9

## 2018-01-30 MED ORDER — AZITHROMYCIN 500 MG PO TABS
500.0000 mg | ORAL_TABLET | Freq: Once | ORAL | Status: AC
  Administered 2018-01-30: 500 mg via ORAL
  Filled 2018-01-30: qty 1

## 2018-01-30 NOTE — Plan of Care (Signed)
  Problem: Consults Goal: RH STROKE PATIENT EDUCATION Description See Patient Education module for education specifics  Outcome: Progressing   Problem: RH SKIN INTEGRITY Goal: RH STG SKIN FREE OF INFECTION/BREAKDOWN Description No new skin breakdown with min assist    Outcome: Progressing   Problem: RH SAFETY Goal: RH STG ADHERE TO SAFETY PRECAUTIONS W/ASSISTANCE/DEVICE Description STG Adhere to Safety Precautions With Mod I Assistance/Device.  Outcome: Progressing   Problem: RH PAIN MANAGEMENT Goal: RH STG PAIN MANAGED AT OR BELOW PT'S PAIN GOAL Description < 3 out of 10.   Outcome: Progressing   Problem: RH KNOWLEDGE DEFICIT Goal: RH STG INCREASE KNOWLEDGE OF DIABETES Description Pt will be able to verbalize 2 ways he can manage his blood sugar at home at the time of discharge.   Outcome: Progressing Goal: RH STG INCREASE KNOWLEDGE OF HYPERTENSION Description Pt will be able to verbalize 2 ways to manage his blood pressure with min cues at the time of discharge.   Outcome: Progressing

## 2018-01-30 NOTE — Progress Notes (Signed)
[name] is a 67 y.o. male [birth year] [id number]  Subjective: He continues to complain of sore throat on the left.  He states that it is worse.. No new other problems. Slept well. Feeling OK.  Objective: Vital signs in last 24 hours: Temp:  [97.7 F (36.5 C)-98.9 F (37.2 C)] 98.5 F (36.9 C) (12/22 0409) Pulse Rate:  [67-87] 87 (12/22 0944) Resp:  [16-20] 16 (12/22 0944) BP: (102-147)/(47-67) 119/67 (12/22 0944) SpO2:  [93 %-98 %] 94 % (12/22 0409) Weight:  [98 kg] 98 kg (12/22 0409) Weight change: 1.814 kg Last BM Date: 01/27/18  Intake/Output from previous day: 12/21 0701 - 12/22 0700 In: 1404.5 [P.O.:800; I.V.:604.5] Out: 2350 [Urine:2350] Last cbgs: CBG (last 3)  No results for input(s): GLUCAP in the last 72 hours.   Physical Exam General: No apparent distress   HEENT: not dry.  Throat exam is unchanged.  Slight adenopathy at the left mandibular angle is unchanged Lungs: Normal effort. Lungs clear to auscultation, no crackles or wheezes. Cardiovascular: Regular rate and rhythm, no edema Abdomen: S/NT/ND; BS(+) Musculoskeletal:  unchanged Neurological: No new neurological deficits Wounds: N/A    Skin: clear  Aging changes Mental state: Alert, oriented, cooperative    Lab Results: BMET    Component Value Date/Time   NA 145 01/19/2018 0655   NA 145 (H) 08/19/2017 0952   K 4.6 01/19/2018 0655   CL 108 01/19/2018 0655   CO2 29 01/19/2018 0655   GLUCOSE 106 (H) 01/19/2018 0655   BUN 18 01/19/2018 0655   BUN 14 08/19/2017 0952   CREATININE 0.88 01/25/2018 0502   CREATININE CANCELED 04/28/2016 1515   CALCIUM 9.6 01/19/2018 0655   GFRNONAA >60 01/25/2018 0502   GFRNONAA CANCELED 04/28/2016 1515   GFRAA >60 01/25/2018 0502   GFRAA CANCELED 04/28/2016 1515   CBC    Component Value Date/Time   WBC 4.8 01/19/2018 0655   RBC 4.56 01/19/2018 0655   HGB 12.2 (L) 01/19/2018 0655   HCT 41.3 01/19/2018 0655   PLT 176 01/19/2018 0655   MCV 90.6  01/19/2018 0655   MCH 26.8 01/19/2018 0655   MCHC 29.5 (L) 01/19/2018 0655   RDW 14.0 01/19/2018 0655   LYMPHSABS 1.4 01/19/2018 0655   MONOABS 0.6 01/19/2018 0655   EOSABS 0.4 01/19/2018 0655   BASOSABS 0.1 01/19/2018 0655    Studies/Results: No results found.  Medications: I have reviewed the patient's current medications.  Assessment/Plan:   1.  Right MCA small embolic infarcts due to chronic right ICA occlusion in the the setting of cocaine abuse and suspect medical noncompliance.  Left hemiparesis.  Continue with CIR 2.  DVT prophylaxis with Lovenox 3.  Pain management with Lyrica.  Robaxin for spasms as needed 4.  Mood support with Namenda and Cymbalta 5.  Seizure disorder.  On Keppra 6.  Polysubstance abuse.  His UDS was positive for cocaine.  He is a tobacco smoker 7.  Type 2 diabetes diet-controlled 8.  Diastolic congestive heart failure.  Lasix was reduced 9.  Dyslipidemia.  On Lipitor 10.  Gout.  On Zyloprim 11.  Pruritus.  On Atarax-improved 12.  Insomnia.  Continue with Ambien PRN 13.  Sore throat on the left.  Subjectively it is worse.  Probable tonsillitis.  Will use lozenges and start on a Z-Pak empirically.     Length of stay, days: 12  Walker Kehr , MD 01/30/2018, 11:33 AM

## 2018-01-30 NOTE — Progress Notes (Signed)
Physical Therapy Session Note  Patient Details  Name: [name] MRN: [id number] Date of Birth: Oct 28, 1950  Today's Date: 01/30/2018 PT Individual Time: 0915-1000 PT Individual Time Calculation (min): 45 min   Short Term Goals: Week 2:  PT Short Term Goal 1 (Week 2): STG=LTG due to ELOS  Skilled Therapeutic Interventions/Progress Updates:    Pt seated in w/c upon PT arrival, agreeable to therapy tx and denies pain. Therapist donned abdominal binder, teds and socks total assist for time management and transported to the gym. Pt's BP monitored in sitting 141/57 and HR 70 bpm. Pt performed sit<>stand with SPC and supervision, pt symptomatic and reports dizziness, standing BP 119/69 and HR 87 bpm. Pt with continued orthostatic hypotension even with teds and abdominal binder. Pt ambulated 2 x 60 ft with SPC and CGA, ascended 4 steps (6 in) with R rail and descended 6 steps (3inch) with R rail, CGA. Pt's BP checked after activity while seated in w/c 152/69. Pt performed sit<>stand with SPC and CGA, BP in standing 125/78 and pt continues to be symptomatic. Pt transported back to room and left seated in w/c with needs in reach and chair alarm set.   Therapy Documentation Precautions:  Precautions Precautions: Fall, Other (comment) Precaution Comments: L hemiparesis UE>LE, positive orthostatics- watch BP  Restrictions Weight Bearing Restrictions: No    Therapy/Group: Individual Therapy  Netta Corrigan, PT, DPT 01/30/2018, 7:54 AM

## 2018-01-31 ENCOUNTER — Inpatient Hospital Stay (HOSPITAL_COMMUNITY): Payer: Self-pay

## 2018-01-31 NOTE — Progress Notes (Signed)
Physical Therapy Discharge Summary  Patient Details  Name: [name] MRN: [id number] Date of Birth: Jun 06, 1950   Patient has met 11 of 12 long term goals due to improved activity tolerance, improved balance, increased strength, improved attention and improved awareness.  Patient to discharge at a wheelchair level Supervision.  Recommending d/c to SNF as pt will not have available 24/7 supervision/assist upon d/c.     All goals met.   Recommendation:  Patient will benefit from ongoing skilled PT services in skilled nursing facility to continue to advance safe functional mobility, address ongoing impairments in balance, strength, mobility and minimize fall risk.  Equipment: No equipment provided, pt has [place] at home. Has power chair as well, have contacted [facility] for new power cord.   Reasons for discharge: treatment goals met and discharge from hospital  Patient/family agrees with progress made and goals achieved: Yes    PT Discharge Precautions/Restrictions Precautions Precautions: Fall;Other (comment) Precaution Comments: L hemiparesis UE>LE, positive orthostatics- watch BP  Restrictions Weight Bearing Restrictions: No Vital Signs Therapy Vitals Pulse Rate: 71 Resp: 18 Patient Position (if appropriate): Sitting Oxygen Therapy SpO2: 94 % O2 Device: Room Air Pain   denies pain Cognition Overall Cognitive Status: Within Functional Limits for tasks assessed Arousal/Alertness: Awake/alert Orientation Level: Oriented X4 Focused Attention: Appears intact Selective Attention: Appears intact Awareness: Appears intact Safety/Judgment: Appears intact Sensation Sensation Light Touch: Appears Intact Proprioception: Appears Intact Additional Comments: sensation appears intact B LEs Coordination Gross Motor Movements are Fluid and Coordinated: No Fine Motor Movements are Fluid and Coordinated: No Coordination and Movement Description: limited by weakness  L  UE and L LE  Heel Shin Test: unable to perform with L LE  Motor  Motor Motor: Hemiplegia Motor - Discharge Observations: L hemiparesis  Mobility Bed Mobility Bed Mobility: Rolling Right;Rolling Left;Supine to Sit;Sit to Supine Rolling Right: Supervision/verbal cueing Rolling Left: Supervision/Verbal cueing Supine to Sit: Supervision/Verbal cueing Sit to Supine: Supervision/Verbal cueing Transfers Transfers: Sit to Stand;Stand to Sit;Stand Pivot Transfers Sit to Stand: Supervision/Verbal cueing Stand to Sit: Supervision/Verbal cueing Stand Pivot Transfers: Supervision/Verbal cueing Stand Pivot Transfer Details: Verbal cues for technique;Verbal cues for precautions/safety;Verbal cues for safe use of DME/AE Transfer (Assistive device): Straight cane Locomotion  Gait Ambulation: Yes Gait Assistance: Supervision/Verbal cueing Gait Distance (Feet): 150 Feet Assistive device: Straight cane Gait Assistance Details: Verbal cues for sequencing;Verbal cues for technique;Verbal cues for precautions/safety Gait Gait: Yes Gait Pattern: Step-to pattern;Decreased step length - left;Decreased weight shift to left;Trunk flexed;Wide base of support Gait velocity: decreased Stairs / Additional Locomotion Stairs: Yes Stairs Assistance: Supervision/Verbal cueing Stair Management Technique: Two rails Number of Stairs: 4 Height of Stairs: 6 Wheelchair Mobility Wheelchair Mobility: Yes Wheelchair Assistance: Supervision/Verbal cueing Wheelchair Propulsion: Right upper extremity;Right lower extremity Distance: 150 ft  Trunk/Postural Assessment  Cervical Assessment Cervical Assessment: Within Functional Limits Thoracic Assessment Thoracic Assessment: Within Functional Limits Lumbar Assessment Lumbar Assessment: Within Functional Limits Postural Control Postural Control: Within Functional Limits  Balance Balance Balance Assessed: Yes Static Sitting Balance Static Sitting - Level of  Assistance: 5: Stand by assistance Dynamic Sitting Balance Dynamic Sitting - Level of Assistance: 5: Stand by assistance Static Standing Balance Static Standing - Level of Assistance: 5: Stand by assistance Dynamic Standing Balance Dynamic Standing - Level of Assistance: 5: Stand by assistance Extremity Assessment  RLE Assessment RLE Assessment: Within Functional Limits LLE Assessment LLE Assessment: Exceptions to [facility] General Strength Comments: MMT ankle dorisflexion 3/5, quad 3/5, hip flexor 3-/5, hip abductor  2/5     Netta Corrigan, PT, DPT 01/31/2018, 9:06 AM

## 2018-01-31 NOTE — Discharge Summary (Signed)
NAME: [name], [name] MEDICAL RECORD DX:[id number] ACCOUNT [id number] DATE OF BIRTH:13-Nov-1950 FACILITY: [facility] LOCATION: [facility] PHYSICIAN:ANDREW Letta Pate, MD  DISCHARGE SUMMARY  DATE OF DISCHARGE:  02/04/2018  DISCHARGE DIAGNOSES: 1.  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 2.  Subcutaneous Lovenox for deep venous thrombosis prophylaxis. 3.  Pain management. 3.  Seizure disorder.  4.  History of polysubstance abuse.   5.  Diabetes mellitus.   6.  Diastolic congestive heart failure. 7.  Hyperlipidemia. 8.  History of gout.   9.  Sleep disturbance.   10.  Tonsillitis 11.Orthostasis  HOSPITAL COURSE:  This is a 67 year old right-handed, multivitamin medical polysubstance abuse, alcohol abuse, chronic occluded right ICA.  Recent cerebrovascular accident with left-sided residual weakness maintained on aspirin and Plavix therapy.  Was  discharged to a skilled nursing facility, later to home.  Was using a walker prior to admission.  Presented 01/13/2018 with increasing weakness 2 to 3 days.  Urine drug screen positive for cocaine.  MRI showed multifocal small acute right frontal MCA  territory infarctions.  Cerebral angiogram confirmed occluded right ICA.  Stent without evidence of String sign and no plan for further intervention.  Echocardiogram with ejection fraction of 55-60% without emboli.  Chest x-ray with incidental findings  of right upper lobe nodule.  Neurology followup.  Maintained on aspirin and Plavix therapy.  The patient was admitted for comprehensive rehabilitation program.  PAST MEDICAL HISTORY:  See discharge diagnoses.  SOCIAL HISTORY:  Lives alone at home at [place] apartments.  FUNCTIONAL STATUS:  Upon admission to rehab services was minimal assist with stand pivot transfers, minimal assist 130 feet straight cane, min mod  assist with activities of daily living.  PHYSICAL EXAMINATION:   VITAL SIGNS:  Blood pressure 168/76, pulse 75, temperature 98, respirations 17.   GENERAL:  Alert male, dysarthric speech.  Reasonable insight and awareness.   HEENT:  EOMs intact.   NECK:  Supple, nontender, no JVD.   CARDIOVASCULAR:  Rate controlled.   ABDOMEN:  Soft, nontender, good bowel sounds.   LUNGS:  Clear to auscultation without wheeze.    REHABILITATION HOSPITAL COURSE:  The patient was admitted to inpatient rehabilitation services.  Therapies initiated on a 3-hour daily basis, consisting of physical therapy, occupational therapy, speech therapy and rehabilitation nursing.  The following  issues were addressed during patient's rehabilitation stay:    Pertaining to the patient's right MCA infarction, remained on aspirin and Plavix therapy.  Follow up per Neurology services.  Subcutaneous Lovenox for deep venous thrombosis prophylaxis.    Pain management with the use of Lyrica was using Robaxin for muscle spasms.    Keppra for seizure disorder.  No seizure activity.      Noted long history of polysubstance abuse.  Urine drug screen positive for cocaine.  The patient received counts regards to cessation of illicit drug products as well as tobacco abuse with Nicoderm patch and medical noncompliance.    Blood sugars overall monitored.  He exhibited no other signs of fluid overload.  His Lasix was held due to ongoing bouts of orthostasis.  He did remain on low dose Coreg.  Lipitor for hyperlipidemia.    There was some question of tonsillitis.  He completed a course of Zithromax.    The patient received weekly collaborative interdisciplinary team conferences to discuss estimated length of stay, family teaching, any barriers to discharge.  Ambulating 60 feet straight point cane, contact guard assist.  Up and down stairs contact  guard.  Gathered belongings for activities of daily living and homemaking.  It was  discussed no driving.after long discussion it was felt skilled nursing facility was needed bed becoming available 02/04/2018 and discharged taking place.  DISCHARGE MEDICATIONS:  Allopurinol 300 mg p.o. daily, aspirin 325 mg p.o. daily, Lipitor 80 mg p.o. daily, baclofen 10 mg p.o. at bedtime, Coreg 3.125 mg p.o. b.i.d., Plavix 75 mg p.o. daily, Cymbalta 60 mg p.o. daily, folic acid 1 mg p.o. daily, Atarax  25 mg p.o. every 6 hours, Keppra 500 mg p.o. b.i.d., Namenda 10 mg p.o. b.i.d.  Dulera 2 puffs twice daily, multivitamin daily, Nicoderm patch taper as directed, Protonix 40 mg p.o. daily, Lyrica 150 mg p.o. b.i.d., Tylenol as needed.  DIET:  Diabetic diet.  The patient will follow up with Dr. Alysia Penna at the outpatient rehab service office as advised; Dr. Erlinda Hong, neurology service, call for appointment; Dr. Charlott Rakes, medical management.  SPECIAL INSTRUCTIONS:  No driving.  No smoking, no alcohol, no illicit drug use.  AN/NUANCE D:01/31/2018 T:01/31/2018 JOB:[id number]

## 2018-01-31 NOTE — Progress Notes (Signed)
Social Work  Discharge Note  The overall goal for the admission was met for:   Discharge location: NO-[facility]SNF  Length of Stay: NO-17 DAYS  Discharge activity level: Yes-SUPERVISION CUEING  Home/community participation: Yes  Services provided included: MD, RD, PT, OT, SLP, RN, CM, Pharmacy, Neuropsych and SW  Financial Services: Medicaid and Private Insurance: [employer]-MEDICARE  Follow-up services arranged: GOING TO NH FOR MORE REHAB  Comments (or additional information):PT AND [name] FEEL BEST OPTION IS TO GO TO [facility] TO GET [facility] TO ASSIST HIM  Patient/Family verbalized understanding of follow-up arrangements: Yes  Individual responsible for coordination of the follow-up plan: SELF & [name]  Confirmed correct DME delivered: Elease Hashimoto 01/31/2018    Elease Hashimoto

## 2018-01-31 NOTE — Discharge Summary (Signed)
Discharge summary job [id number]

## 2018-01-31 NOTE — Progress Notes (Addendum)
Social Work Patient ID: [name], male   DOB: 1950-04-14, 67 y.o.   MRN: [id number] Met with pt and spoke with [name]-aide to confirm discharge date tomorrow. Gave him a transport chair that someone has donated to assist him until he can get the charger for his power chair. He wants to go home and feels he can managed at home. Home health arranged via [facility]. [name] reports she will not be with him until the first of the year and he needs to go to a NH. Pt was not aware of this and now is agreeable to going back to [place] since he has been there before. Will begin NHP.

## 2018-01-31 NOTE — Progress Notes (Signed)
Occupational Therapy Session Note  Patient Details  Name: [name] MRN: [id number] Date of Birth: [birth year]  Today's Date: 01/31/2018 OT Individual Time: 0700-0800 OT Individual Time Calculation (min): 60 min    Short Term Goals: Week 2:  OT Short Term Goal 1 (Week 2): Pt will be able to complete stand pivot to toilet with close S. OT Short Term Goal 2 (Week 2): Pt will don shirt with (with A to lift L arm) S. OT Short Term Goal 3 (Week 2): Pt will perform 3/3 toileting tasks with CGA  Skilled Therapeutic Interventions/Progress Updates:    OT intervention with focus on BADL retraining, bed mobility, sitting balance, functional transfers, sit<>stand, standing balance, toileting, and safety awareness to increase independence with BADLs. Pt requires min A for LB dressing tasks and supervision for remainder of bathing/dressing tasks.  Pt completed bathing/dressing tasks at sink level 2/2 ongoing hypotension.  Pt sat EOB and therapist assisted with donning Ted hose prior to transferring to w/c for ADLs. BP: supine-152/70, sitting-135/53 nonsymptomatic. Pt requested to use toilet and completed transfer and toileting tasks without assistance. Continued discharge planning and education.  Pt will have home health aide 5 days/week to assist with ADLs. Pt remained seated in w/c with breakfast tray and all needs within reach.  Belt alarm activated.   Therapy Documentation Precautions:  Precautions Precautions: Fall, Other (comment) Precaution Comments: L hemiparesis UE>LE, positive orthostatics- watch BP  Restrictions Weight Bearing Restrictions: No Pain:  Pt denies pain   Therapy/Group: Individual Therapy  Leroy Libman 01/31/2018, 10:01 AM

## 2018-01-31 NOTE — Progress Notes (Signed)
Occupational Therapy Session Note  Patient Details  Name: [name] MRN: [id number] Date of Birth: 1950/04/13  Today's Date: 01/31/2018 OT Individual Time: 1400-1415 OT Individual Time Calculation (min): 15 min  and Today's Date: 01/31/2018 OT Missed Time: 15 Minutes Missed Time Reason: Patient fatigue;Pain   Short Term Goals: Week 2:  OT Short Term Goal 1 (Week 2): Pt will be able to complete stand pivot to toilet with close S. OT Short Term Goal 2 (Week 2): Pt will don shirt with (with A to lift L arm) S. OT Short Term Goal 3 (Week 2): Pt will perform 3/3 toileting tasks with CGA  Skilled Therapeutic Interventions/Progress Updates:     Pt resting in bed upon arrival stating he was trying to "rest his back." OT intervention with focus on continued discharge planning, home safety, and ongoing education.  Pt stated he was happy to be going home but also sad because he wouldn't have anyone to talk to.  Pt remained in bed with all needs within reach.   Therapy Documentation Precautions:  Precautions Precautions: Fall, Other (comment) Precaution Comments: L hemiparesis UE>LE, positive orthostatics- watch BP  Restrictions Weight Bearing Restrictions: No General: General OT Amount of Missed Time: 15 Minutes Pain:  Pt c/o back pain (unrated); repositioned in bed   Therapy/Group: Individual Therapy  Leroy Libman 01/31/2018, 2:32 PM

## 2018-01-31 NOTE — Progress Notes (Signed)
Physical Therapy Session Note  Patient Details  Name: [name] MRN: [id number] Date of Birth: [birth year]  Today's Date: 01/31/2018 PT Individual Time:0900-0958 and 1430-1510 PT Individual Time Calculation (min): 58 min and 40 min   Short Term Goals: Week 2:  PT Short Term Goal 1 (Week 2): STG=LTG due to ELOS    Skilled Therapeutic Interventions/Progress Updates:    PT Treatment Interventions: Session 1: Pt seated in w/c upon PT arrival, agreeable to therapy tx and denies pain. Pt propelled w/c to the gym with supervision x 150 ft. Pt ambulated x 150 ft with SPC and close supervision this session. Pt transported to ortho gym and performed stand pivot to car with [facility] and supervision. Pt transported to rehab apartment and performed stand pivot to bed with supervision, performed bed mobility with supervision/CGA. Therapist educating pt throughout session on safety with all functional mobility at home. Pt transported to gym and ascended/descended 4 steps with rail and supervision, step to pattern and verbal cues for safety. Pt transported back to room and left seated in w/c with needs in reach and chair alarm set. Pt given transport chair for d/c as he reports he was unable to locate his manual chair after d/c from an ALF prior to this hospital admission, pt has [place] at home.   Session 2: Pt supine in bed upon PT arrival, agreeable to therapy tx and denies pain. Pt transferred to sitting EOB with supervision and reports some dizziness. Pt's BP monitored in sitting- 121/57. Pt performed stand pivot to w/c with min assist and verbal cues for techniques. Pt's BP checked while sitting in w/c 105/60. Pt performed 2 x 10 LAQ and hip flexion for LE strengthening. Pt and therapist discussing d/c planning, pt will not end up having a tech available for assist until the new year and therefore we are now recommending d/c to a skilled nursing facility. Pt upset about this, therapist providing emotional  support. Pt transported to the dayroom. Deferred to seated/chair level exercises this session secondary to low BP/dizziness. Stand pivot to nustep from w/c with CGA/min assist. Pt used nustep x 8 minutes on workload 5 for global strengthening, endurance and activity tolerance. Pt transported back to room, stand pivot to bed and left supine with needs in reach, chair alarm set.    Therapy Documentation Precautions:  Precautions Precautions: Fall, Other (comment) Precaution Comments: L hemiparesis UE>LE, positive orthostatics- watch BP  Restrictions Weight Bearing Restrictions: No   Therapy/Group: Individual Therapy  Netta Corrigan, PT, DPT 01/31/2018, 2:41 PM

## 2018-01-31 NOTE — NC FL2 (Signed)
[place] LEVEL OF CARE SCREENING TOOL     IDENTIFICATION  Patient Name: [name] Birthdate: 06-25-1950 Sex: male Admission Date (Current Location): 01/18/2018  [facility] and [place] Number:  Herbalist and Address:  [facility], [place] [address] [place], [place] [address]      Provider Number: [id number]  Attending Physician Name and Address:  Charlett Blake, MD  Relative Name and Phone Number:  [name] POA [phone]    Current Level of Care: Other (Comment)(rehab) Recommended Level of Care: [place] Prior Approval Number:    Date Approved/Denied:   PASRR Number: [id number]  Discharge Plan: SNF    Current Diagnoses: Patient Active Problem List   Diagnosis Date Noted  . Hypoalbuminemia due to protein-calorie malnutrition ([place])   . Pruritus   . Labile blood pressure   . Diabetes mellitus type 2 in obese ([place])   . Seizures ([place])   . Right middle cerebral artery stroke ([place]) 01/18/2018  . AMS (altered mental status) 11/29/2017  . Acute encephalopathy 11/28/2017  . Left shoulder pain 09/28/2017  . Cerebral thrombosis with cerebral infarction 07/18/2016  . Acute ischemic stroke ([place])   . Left hemiplegia ([place])   . Stroke-like symptoms 07/17/2016  . Insomnia 01/29/2016  . Depression 01/29/2016  . Alcohol abuse with intoxication ([place]) 10/16/2015  . Psoriasiform dermatitis 07/12/2015  . Dementia due to another medical condition ([place]) 07/12/2015  . Urinary incontinence 03/14/2015  . Type 2 diabetes mellitus with peripheral neuropathy ([place]) 03/13/2015  . Hemiparesis affecting left side as late effect of cerebrovascular accident ([place]) 02/18/2015  . Cerebrovascular accident (CVA) due to stenosis of right carotid artery ([place])   . Coronary artery disease involving native coronary artery of native heart without angina pectoris   . Tachypnea   . Thrombocytopenia ([place])   . History of stroke    . Stroke (cerebrum) ([place]) 02/06/2015  . Arterial ischemic stroke, MCA (middle cerebral artery), right, acute ([place])   . DOE (dyspnea on exertion) 11/23/2014  . PAF (paroxysmal atrial fibrillation) ([place]) 11/23/2014  . HLD (hyperlipidemia) 11/01/2014  . Tobacco use disorder 11/01/2014  . Diastolic CHF, acute on chronic (HCC) 11/01/2014  . Cerebral infarction due to stenosis of right carotid artery ([place]) 11/01/2014  . Carotid stenosis 11/01/2014  . Hyperkalemia 10/24/2014  . H/O total knee replacement 10/24/2014  . Essential hypertension   . Polysubstance abuse ([place]) 08/28/2014  . Cocaine abuse ([place])   . Intracranial carotid stenosis   . Headache around the eyes 08/27/2014  . Acute CVA (cerebrovascular accident) ([place]) 08/27/2014  . CVA (cerebral vascular accident) ([place]) 08/26/2014  . History of DVT (deep vein thrombosis)   . Alcoholism ([place])   . Embolic stroke ([place])   . Cerebral infarction due to embolism of right middle cerebral artery ([place])   . Hypotension   . AKI (acute kidney injury) ([place])   . Stroke ([place])   . CVA (cerebral infarction) 08/01/2014  . Alcohol dependence with withdrawal with complication ([place]) 25/36/6440  . DVT (deep venous thrombosis) ([place]) 02/22/2014  . Lower extremity edema 02/21/2014  . Chronic diastolic congestive heart failure ([place]) 11/21/2013  . CAD (coronary artery disease) 11/21/2013  . Alcohol abuse 11/21/2013  . GERD (gastroesophageal reflux disease) 08/10/2013  . Gout 08/10/2013  . Tobacco abuse 07/26/2013  . Trichiasis without entropion 04/16/2013  . Dizziness 04/10/2013  . Eyelid lesion 01/24/2013  . Enophthalmos due to trauma 01/19/2013  . Medial orbital wall  fracture 01/19/2013  . Closed blow-out fracture of floor of orbit ([place]) 01/19/2013  . Binocular vision disorder with diplopia 01/03/2013  . Fracture of inferior orbital wall ([place]) 01/03/2013  . Fracture of orbital floor ([place]) 01/03/2013  . Pain of left eye 01/03/2013  . Peripheral  vascular disease ([place]) 07/27/2012  . COPD (chronic obstructive pulmonary disease) ([place]) 04/04/2012  . HTN (hypertension) 04/04/2012    Orientation RESPIRATION BLADDER Height & Weight     Self, Time, Situation, Place  Normal Continent Weight: 216 lb 0.8 oz (98 kg) Height:  6' (182.9 cm)  BEHAVIORAL SYMPTOMS/MOOD NEUROLOGICAL BOWEL NUTRITION STATUS      Continent Diet(regular diet with thin liquids)  AMBULATORY STATUS COMMUNICATION OF NEEDS Skin   Limited Assist Verbally Normal                       Personal Care Assistance Level of Assistance  Bathing, Dressing Bathing Assistance: Limited assistance   Dressing Assistance: Limited assistance     Functional Limitations Info  Speech     Speech Info: Impaired    SPECIAL CARE FACTORS FREQUENCY  PT (By licensed PT), OT (By licensed OT)     PT Frequency: 5x week OT Frequency: 5x week            Contractures Contractures Info: Not present    Additional Factors Info  Code Status Code Status Info: Full Code             Current Medications (01/31/2018):  This is the current hospital active medication list Current Facility-Administered Medications  Medication Dose Route Frequency Provider Last Rate Last Dose  . 0.45 % sodium chloride infusion   Intravenous Continuous Cathlyn Parsons, PA-C 75 mL/hr at 01/29/18 1459    . acetaminophen (TYLENOL) tablet 650 mg  650 mg Oral Q4H PRN Cathlyn Parsons, PA-C   650 mg at 01/24/18 2044   Or  . acetaminophen (TYLENOL) solution 650 mg  650 mg Per Tube Q4H PRN Angiulli, Lavon Paganini, PA-C       Or  . acetaminophen (TYLENOL) suppository 650 mg  650 mg Rectal Q4H PRN Angiulli, Lavon Paganini, PA-C      . albuterol (PROVENTIL) (2.5 MG/3ML) 0.083% nebulizer solution 2.5 mg  2.5 mg Inhalation Q6H PRN Angiulli, Lavon Paganini, PA-C      . allopurinol (ZYLOPRIM) tablet 300 mg  300 mg Oral Daily Cathlyn Parsons, PA-C   300 mg at 01/31/18 7867  . aspirin suppository 300 mg  300 mg Rectal Daily  Angiulli, Lavon Paganini, PA-C       Or  . aspirin tablet 325 mg  325 mg Oral Daily Cathlyn Parsons, PA-C   325 mg at 01/31/18 6720  . atorvastatin (LIPITOR) tablet 80 mg  80 mg Oral q1800 Cathlyn Parsons, PA-C   80 mg at 01/30/18 1745  . baclofen (LIORESAL) tablet 10 mg  10 mg Oral QHS Kirsteins, Luanna Salk, MD   10 mg at 01/30/18 2130  . carvedilol (COREG) tablet 6.25 mg  6.25 mg Oral BID WC Kirsteins, Luanna Salk, MD   6.25 mg at 01/31/18 9470  . clopidogrel (PLAVIX) tablet 75 mg  75 mg Oral Daily Cathlyn Parsons, PA-C   75 mg at 01/31/18 9628  . docusate sodium (COLACE) capsule 200 mg  200 mg Oral TID Charlett Blake, MD   200 mg at 01/31/18 3662  . DULoxetine (CYMBALTA) DR capsule 60 mg  60 mg Oral Daily  Cathlyn Parsons, PA-C   60 mg at 01/31/18 5726  . enoxaparin (LOVENOX) injection 40 mg  40 mg Subcutaneous Q24H AngiulliLavon Paganini, PA-C   40 mg at 01/31/18 1210  . feeding supplement (ENSURE ENLIVE) (ENSURE ENLIVE) liquid 237 mL  237 mL Oral BID BM AngiulliLavon Paganini, PA-C   237 mL at 01/31/18 1210  . feeding supplement (PRO-STAT SUGAR FREE 64) liquid 30 mL  30 mL Oral BID Jamse Arn, MD   30 mL at 01/31/18 0827  . folic acid (FOLVITE) tablet 1 mg  1 mg Oral Daily Cathlyn Parsons, PA-C   1 mg at 01/31/18 2035  . hydrocerin (EUCERIN) cream   Topical BID Charlett Blake, MD      . hydrOXYzine (ATARAX/VISTARIL) tablet 25 mg  25 mg Oral Q6H Jamse Arn, MD   25 mg at 01/31/18 1210  . lactulose (CHRONULAC) 10 GM/15ML solution 10 g  10 g Oral BID PRN Cathlyn Parsons, PA-C   10 g at 01/30/18 0403  . levETIRAcetam (KEPPRA) tablet 500 mg  500 mg Oral BID Cathlyn Parsons, PA-C   500 mg at 01/31/18 5974  . memantine (NAMENDA) tablet 10 mg  10 mg Oral BID Cathlyn Parsons, PA-C   10 mg at 01/31/18 1638  . methocarbamol (ROBAXIN) tablet 500 mg  500 mg Oral Q6H PRN [name], MD   500 mg at 01/30/18 1634  . mometasone-formoterol (DULERA) 200-5 MCG/ACT inhaler 2  puff  2 puff Inhalation BID Cathlyn Parsons, PA-C   2 puff at 01/31/18 [id number]  . multivitamin with minerals tablet 1 tablet  1 tablet Oral Daily Cathlyn Parsons, PA-C   1 tablet at 01/31/18 4680  . naphazoline-pheniramine (NAPHCON-A) 0.025-0.3 % ophthalmic solution 1 drop  1 drop Left Eye QID PRN Angiulli, Lavon Paganini, PA-C      . nicotine (NICODERM CQ - dosed in mg/24 hours) patch 21 mg  21 mg Transdermal Daily AngiulliLavon Paganini, PA-C   21 mg at 01/31/18 0830  . pantoprazole (PROTONIX) EC tablet 40 mg  40 mg Oral Daily Cathlyn Parsons, PA-C   40 mg at 01/31/18 3212  . phenol-menthol (CEPASTAT) lozenge 1 lozenge  1 lozenge Buccal PRN Plotnikov, Aleksei V, MD      . pregabalin (LYRICA) capsule 150 mg  150 mg Oral BID Cathlyn Parsons, PA-C   150 mg at 01/31/18 0827  . senna (SENOKOT) tablet 8.6 mg  1 tablet Oral QHS Jamse Arn, MD   8.6 mg at 01/30/18 2132  . thiamine (VITAMIN B-1) tablet 100 mg  100 mg Oral Daily Cathlyn Parsons, PA-C   100 mg at 01/31/18 2482   Or  . thiamine (B-1) injection 100 mg  100 mg Intravenous Daily Angiulli, Lavon Paganini, PA-C      . triamcinolone cream (KENALOG) 0.1 %   Topical BID Kirsteins, Luanna Salk, MD      . zolpidem (AMBIEN) tablet 5 mg  5 mg Oral QHS PRN Charlett Blake, MD   5 mg at 01/30/18 2133     Discharge Medications: Please see discharge summary for a list of discharge medications.  Relevant Imaging Results:  Relevant Lab Results:   Additional Information SSN: [id number]  Bryer Cozzolino, Gardiner Rhyme, LCSW

## 2018-01-31 NOTE — Discharge Instructions (Signed)
Inpatient Rehab Discharge Instructions  [name] Discharge date and time: No discharge date for patient encounter.   Activities/Precautions/ Functional Status: Activity: activity as tolerated Diet: diabetic diet Wound Care: none needed Functional status:  ___ No restrictions     ___ Walk up steps independently ___ 24/7 supervision/assistance   ___ Walk up steps with assistance ___ Intermittent supervision/assistance  ___ Bathe/dress independently ___ Walk with walker     _x__ Bathe/dress with assistance ___ Walk Independently    ___ Shower independently ___ Walk with assistance    ___ Shower with assistance ___ No alcohol     ___ Return to work/school ________  Special Instructions:  No smoking, driving, alcohol or illicit drug use  COMMUNITY REFERRALS UPON DISCHARGE:    Home Health:   PT & OT    Agency:[facility] Phone:[phone]   Date of last service:02/01/2018  Medical Equipment/Items Ordered:HAS ALL EQUIPMENT NEEDS A CHARGE FOR HIS POWER CHAIR-ASKING [facility] ABOUT DEBBIE [phone] WILL BE CONTACTING YOU REGARDING YOUR POWER CHARGER FOR YOUR POWER CHAIR   Other:RESUME AIDE SERVICES-PCS SERVICES-[name]  GENERAL COMMUNITY RESOURCES FOR PATIENT/FAMILY: Support Groups:[facility] SUPPORT GROUP THE SECOND Thursday @ 6:00-7:00 PM ON THE REHAB UNIT QUESTIONS CONTACT AMY [phone]  STROKE/TIA DISCHARGE INSTRUCTIONS SMOKING Cigarette smoking nearly doubles your risk of having a stroke & is the single most alterable risk factor  If you smoke or have smoked in the last 12 months, you are advised to quit smoking for your health.  Most of the excess cardiovascular risk related to smoking disappears within a year of stopping.  Ask you doctor about anti-smoking medications  [place] Quit Line: 1-800-QUIT NOW  Free Smoking Cessation Classes [phone]  CHOLESTEROL Know your levels; limit fat & cholesterol in your diet  Lipid Panel     Component Value Date/Time   CHOL  161 01/13/2018 0549   TRIG 167 (H) 01/13/2018 0549   HDL 30 (L) 01/13/2018 0549   CHOLHDL 5.4 01/13/2018 0549   VLDL 33 01/13/2018 0549   LDLCALC 98 01/13/2018 0549      Many patients benefit from treatment even if their cholesterol is at goal.  Goal: Total Cholesterol (CHOL) less than 160  Goal:  Triglycerides (TRIG) less than 150  Goal:  HDL greater than 40  Goal:  LDL (LDLCALC) less than 100   BLOOD PRESSURE American Stroke Association blood pressure target is less that 120/80 mm/Hg  Your discharge blood pressure is:  BP: (!) 152/65  Monitor your blood pressure  Limit your salt and alcohol intake  Many individuals will require more than one medication for high blood pressure  DIABETES (A1c is a blood sugar average for last 3 months) Goal HGBA1c is under 7% (HBGA1c is blood sugar average for last 3 months)  Diabetes:    Lab Results  Component Value Date   HGBA1C 5.4 01/13/2018     Your HGBA1c can be lowered with medications, healthy diet, and exercise.  Check your blood sugar as directed by your physician  Call your physician if you experience unexplained or low blood sugars.  PHYSICAL ACTIVITY/REHABILITATION Goal is 30 minutes at least 4 days per week  Activity: Increase activity slowly, Therapies: Physical Therapy: Home Health Return to work:   Activity decreases your risk of heart attack and stroke and makes your heart stronger.  It helps control your weight and blood pressure; helps you relax and can improve your mood.  Participate in a regular exercise program.  Talk with your doctor about  the best form of exercise for you (dancing, walking, swimming, cycling).  DIET/WEIGHT Goal is to maintain a healthy weight  Your discharge diet is:  Diet Order            Diet Carb Modified Fluid consistency: Thin; Room service appropriate? Yes  Diet effective now              liquids Your height is:  Height: 6' (182.9 cm) Your current weight is: Weight: 102.4  kg Your Body Mass Index (BMI) is:  BMI (Calculated): 30.62  Following the type of diet specifically designed for you will help prevent another stroke.  Your goal weight range is:    Your goal Body Mass Index (BMI) is 19-24.  Healthy food habits can help reduce 3 risk factors for stroke:  High cholesterol, hypertension, and excess weight.  RESOURCES Stroke/Support Group:  Call [phone]   STROKE EDUCATION PROVIDED/REVIEWED AND GIVEN TO PATIENT Stroke warning signs and symptoms How to activate emergency medical system (call 911). Medications prescribed at discharge. Need for follow-up after discharge. Personal risk factors for stroke. Pneumonia vaccine given:  Flu vaccine given:  My questions have been answered, the writing is legible, and I understand these instructions.  I will adhere to these goals & educational materials that have been provided to me after my discharge from the hospital.     My questions have been answered and I understand these instructions. I will adhere to these goals and the provided educational materials after my discharge from the hospital.  Patient/Caregiver Signature _______________________________ Date __________  Clinician Signature _______________________________________ Date __________  Please bring this form and your medication list with you to all your follow-up doctor's appointments.

## 2018-02-01 ENCOUNTER — Inpatient Hospital Stay (HOSPITAL_COMMUNITY): Payer: Self-pay

## 2018-02-01 ENCOUNTER — Inpatient Hospital Stay (HOSPITAL_COMMUNITY): Payer: Self-pay | Admitting: Physical Therapy

## 2018-02-01 ENCOUNTER — Inpatient Hospital Stay (HOSPITAL_COMMUNITY): Payer: Self-pay | Admitting: Occupational Therapy

## 2018-02-01 LAB — CREATININE, SERUM
Creatinine, Ser: 0.73 mg/dL (ref 0.61–1.24)
GFR calc Af Amer: >60 mL/min (ref >60)

## 2018-02-01 MED ORDER — CARVEDILOL 3.125 MG PO TABS
3.1250 mg | ORAL_TABLET | Freq: Two times a day (BID) | ORAL | Status: DC
  Administered 2018-02-01 – 2018-02-04 (×6): 3.125 mg via ORAL
  Filled 2018-02-01 (×6): qty 1

## 2018-02-01 MED ORDER — DM-GUAIFENESIN ER 30-600 MG PO TB12
1.0000 | ORAL_TABLET | Freq: Two times a day (BID) | ORAL | Status: DC
  Administered 2018-02-01 – 2018-02-04 (×7): 1 via ORAL
  Filled 2018-02-01 (×7): qty 1

## 2018-02-01 NOTE — Progress Notes (Signed)
Social Work Patient ID: [name], male   DOB: 04/11/1950, 67 y.o.   MRN: [id number] Met with pt to discuss looking for NH bed at [facility] since he has been there before have sent over FL2 and will work on insurance approval.  

## 2018-02-01 NOTE — Progress Notes (Signed)
Occupational Therapy Session Note  Patient Details  Name: [name] MRN: [id number] Date of Birth: [birth year]  Today's Date: 02/01/2018 OT Individual Time: 7494-4967 OT Individual Time Calculation (min): 18 min  and Today's Date: 02/01/2018   40 minutes missed OT intervention secondary to pain.    Short Term Goals: Week 2:  OT Short Term Goal 1 (Week 2): Pt will be able to complete stand pivot to toilet with close S. OT Short Term Goal 2 (Week 2): Pt will don shirt with (with A to lift L arm) S. OT Short Term Goal 3 (Week 2): Pt will perform 3/3 toileting tasks with CGA  Skilled Therapeutic Interventions/Progress Updates:    Upon entering the room, pt supine in bed and reports 10/10 back pain. He verbalized that RN recently gave him muscle relaxer and pain medication but it was still hurting "awful bad". Pt declined OT intervention. OT provided pt with hot pack to L lower back per pt request and repositioned for comfort and to protect the hemiplegic side. Pt continues to decline secondary to increased pain. Pt remained in bed with call bell and all needed items within reach.  40 missed minutes of skilled OT intervention.   Therapy Documentation Precautions:  Precautions Precautions: Fall, Other (comment) Precaution Comments: L hemiparesis UE>LE, positive orthostatics- watch BP  Restrictions Weight Bearing Restrictions: No General: General OT Amount of Missed Time: 40 Minutes Pain: Pain Assessment Pain Scale: 0-10 Pain Score: 5  Pain Type: Chronic pain Pain Location: Back Pain Orientation: Lower Pain Descriptors / Indicators: Aching;Dull;Discomfort Pain Frequency: Intermittent Pain Onset: On-going Pain Intervention(s): Medication (See eMAR) ADL: ADL Eating: Set up Where Assessed-Eating: Wheelchair Grooming: Setup Where Assessed-Grooming: Wheelchair, Sitting at sink Upper Body Bathing: Minimal assistance Where Assessed-Upper Body Bathing: Wheelchair Lower Body  Bathing: Minimal assistance Where Assessed-Lower Body Bathing: Sitting at sink, Standing at sink, Wheelchair Upper Body Dressing: Minimal assistance Where Assessed-Upper Body Dressing: Sitting at sink, Wheelchair Lower Body Dressing: Minimal assistance Where Assessed-Lower Body Dressing: Sitting at sink, Standing at sink, Wheelchair Toileting: Contact guard Where Assessed-Toileting: Glass blower/designer: Therapist, music Method: Arts development officer: Grab bars, Raised toilet seat Social research officer, government: Curator Method: Radiographer, therapeutic: Grab bars, Shower seat with back   Therapy/Group: Individual Therapy  Gypsy Decant 02/01/2018, 2:50 PM

## 2018-02-01 NOTE — Progress Notes (Signed)
Physical Therapy Session Note  Patient Details  Name: [name] MRN: [id number] Date of Birth: [birth year]  Today's Date: 02/01/2018 PT Individual Time: 0830-0930 PT Individual Time Calculation (min): 60 min   Short Term Goals: Week 2:  PT Short Term Goal 1 (Week 2): STG=LTG due to ELOS  Skilled Therapeutic Interventions/Progress Updates: Pt received seated in w/c, c/o back pain 9/10 and states he has not taken any pain medication as he is only allowed tylenol "which wont touch it". Alerted RN to pain. Stand pivot transfer to/from nustep minA. Performed BLE/RUE nustep (pt declined use of LUE) level 5 with average 45 steps/min for strengthening and endurance. During transfer back to chair pt continues to complain of R sided low back pain, declines additional standing activities. Returned to bed minA stand pivot. PROM to BLE; note limitations in L gastroc, soleus, hip internal rotation, hamstrings. Manual therapy soft tissue massage and trigger point release to L low back with palpable trigger points; educated pt on suspected overcompensation of quadratus lumborum and paraspinal musculature to assist with swing through d/t hemiplegic LE. Hot pack applied to L low back; remained in bed at end of session, alarm intact and all needs in reach.      Therapy Documentation Precautions:  Precautions Precautions: Fall, Other (comment) Precaution Comments: L hemiparesis UE>LE, positive orthostatics- watch BP  Restrictions Weight Bearing Restrictions: No    Therapy/Group: Individual Therapy  Corliss Skains 02/01/2018, 9:36 AM

## 2018-02-01 NOTE — Progress Notes (Signed)
Occupational Therapy Session Note  Patient Details  Name: ANUP BRIGHAM MRN: [id number] Date of Birth: [birth year]  Today's Date: 02/01/2018 OT Individual Time: 0700-0756 OT Individual Time Calculation (min): 56 min    Short Term Goals: Week 3:   STG=LTG  Skilled Therapeutic Interventions/Progress Updates:    OT intervention with focus on bed mobility, sitting balance, functional transfers, standing balance, toilet transfers, toileting, BADL retraining, activity tolerance and safety awareness to increase independence with BADLs.  Pt sat EOB at supervision level in preparation for CGA transfer to w/c for bathing/dressing at sink.  BP without Teds or binder: supine-144/68, sitting EOB-106/57 with minimal symptoms and able to continue therapy; BP later in therapy after binder and Teds donned while sitting-155/81 with no symptoms. Pt performed toileting tasks without assistance including toilet transfers.  Pt required min A for donning socks and Ted hose.  Pt remained seated in w/c eating breakfast with belt alarm activated.   Therapy Documentation Precautions:  Precautions Precautions: Fall, Other (comment) Precaution Comments: L hemiparesis UE>LE, positive orthostatics- watch BP  Restrictions Weight Bearing Restrictions: No Pain:  Pt denies pain   Therapy/Group: Individual Therapy  Leroy Libman 02/01/2018, 8:03 AM

## 2018-02-01 NOTE — NC FL2 (Signed)
[place] LEVEL OF CARE SCREENING TOOL     IDENTIFICATION  Patient Name: [name] Birthdate: 1950/09/03 Sex: male Admission Date (Current Location): 01/18/2018  [place] and [place] Number:  Kathleen Argue [id number] [place] and Address:  [facility]. [facility], [place] [address], [place], [place] [address]      Provider Number: [id number]  Attending Physician Name and Address:  Charlett Blake, MD  Relative Name and Phone Number:  [name] POA [phone]    Current Level of Care: Other (Comment)(rehab) Recommended Level of Care: [place] Prior Approval Number:    Date Approved/Denied:   PASRR Number: [id number]  Discharge Plan: SNF    Current Diagnoses: Patient Active Problem List   Diagnosis Date Noted  . Hypoalbuminemia due to protein-calorie malnutrition (Vader)   . Pruritus   . Labile blood pressure   . Diabetes mellitus type 2 in obese (Newark)   . Seizures (Eddy)   . Right middle cerebral artery stroke (Dennison) 01/18/2018  . AMS (altered mental status) 11/29/2017  . Acute encephalopathy 11/28/2017  . Left shoulder pain 09/28/2017  . Cerebral thrombosis with cerebral infarction 07/18/2016  . Acute ischemic stroke (Rainsville)   . Left hemiplegia (Stafford)   . Stroke-like symptoms 07/17/2016  . Insomnia 01/29/2016  . Depression 01/29/2016  . Alcohol abuse with intoxication (Kossuth) 10/16/2015  . Psoriasiform dermatitis 07/12/2015  . Dementia due to another medical condition (Pulaski) 07/12/2015  . Urinary incontinence 03/14/2015  . Type 2 diabetes mellitus with peripheral neuropathy (Ethridge) 03/13/2015  . Hemiparesis affecting left side as late effect of cerebrovascular accident (Layhill) 02/18/2015  . Cerebrovascular accident (CVA) due to stenosis of right carotid artery (Spring Lake)   . Coronary artery disease involving native coronary artery of native heart without angina pectoris   . Tachypnea   . Thrombocytopenia (Bloomfield)   . History of  stroke   . Stroke (cerebrum) (Platteville) 02/06/2015  . Arterial ischemic stroke, MCA (middle cerebral artery), right, acute (Janesville)   . DOE (dyspnea on exertion) 11/23/2014  . PAF (paroxysmal atrial fibrillation) (Redwood) 11/23/2014  . HLD (hyperlipidemia) 11/01/2014  . Tobacco use disorder 11/01/2014  . Diastolic CHF, acute on chronic (HCC) 11/01/2014  . Cerebral infarction due to stenosis of right carotid artery (Walker) 11/01/2014  . Carotid stenosis 11/01/2014  . Hyperkalemia 10/24/2014  . H/O total knee replacement 10/24/2014  . Essential hypertension   . Polysubstance abuse (Canton Valley) 08/28/2014  . Cocaine abuse (Fisher)   . Intracranial carotid stenosis   . Headache around the eyes 08/27/2014  . Acute CVA (cerebrovascular accident) (Leesburg) 08/27/2014  . CVA (cerebral vascular accident) (Lenkerville) 08/26/2014  . History of DVT (deep vein thrombosis)   . Alcoholism (Starrucca)   . Embolic stroke (Hemet)   . Cerebral infarction due to embolism of right middle cerebral artery (Ochiltree)   . Hypotension   . AKI (acute kidney injury) (Auburn)   . Stroke (Newport)   . CVA (cerebral infarction) 08/01/2014  . Alcohol dependence with withdrawal with complication (Aurora) 38/46/6599  . DVT (deep venous thrombosis) (Ellis) 02/22/2014  . Lower extremity edema 02/21/2014  . Chronic diastolic congestive heart failure (Oxford) 11/21/2013  . CAD (coronary artery disease) 11/21/2013  . Alcohol abuse 11/21/2013  . GERD (gastroesophageal reflux disease) 08/10/2013  . Gout 08/10/2013  . Tobacco abuse 07/26/2013  . Trichiasis without entropion 04/16/2013  . Dizziness 04/10/2013  . Eyelid lesion 01/24/2013  . Enophthalmos due to trauma 01/19/2013  . Medial orbital wall fracture  01/19/2013  . Closed blow-out fracture of floor of orbit (Twin Valley) 01/19/2013  . Binocular vision disorder with diplopia 01/03/2013  . Fracture of inferior orbital wall (East Globe) 01/03/2013  . Fracture of orbital floor (Oljato[place]Mon[place]) 01/03/2013  . Pain of left eye 01/03/2013  .  Peripheral vascular disease (Athens) 07/27/2012  . COPD (chronic obstructive pulmonary disease) (East Syracuse) 04/04/2012  . HTN (hypertension) 04/04/2012    Orientation RESPIRATION BLADDER Height & Weight     Self, Time, Situation, Place  Normal Continent Weight: 216 lb 0.8 oz (98 kg) Height:  6' (182.9 cm)  BEHAVIORAL SYMPTOMS/MOOD NEUROLOGICAL BOWEL NUTRITION STATUS      Continent Diet(regular thin liquids)  AMBULATORY STATUS COMMUNICATION OF NEEDS Skin   Limited Assist Verbally Normal                       Personal Care Assistance Level of Assistance  Bathing, Dressing Bathing Assistance: Limited assistance   Dressing Assistance: Limited assistance     Functional Limitations Info  Speech     Speech Info: Impaired    SPECIAL CARE FACTORS FREQUENCY  PT (By licensed PT), OT (By licensed OT)     PT Frequency: 5x week OT Frequency: 5x week            Contractures Contractures Info: Not present    Additional Factors Info  Code Status Code Status Info: Full Code             Current Medications (02/01/2018):  This is the current hospital active medication list Current Facility-Administered Medications  Medication Dose Route Frequency Provider Last Rate Last Dose  . 0.45 % sodium chloride infusion   Intravenous Continuous Cathlyn Parsons, PA-C 75 mL/hr at 01/29/18 1459    . acetaminophen (TYLENOL) tablet 650 mg  650 mg Oral Q4H PRN Cathlyn Parsons, PA-C   650 mg at 01/24/18 2044   Or  . acetaminophen (TYLENOL) solution 650 mg  650 mg Per Tube Q4H PRN Angiulli, Lavon Paganini, PA-C       Or  . acetaminophen (TYLENOL) suppository 650 mg  650 mg Rectal Q4H PRN Angiulli, Lavon Paganini, PA-C      . albuterol (PROVENTIL) (2.5 MG/3ML) 0.083% nebulizer solution 2.5 mg  2.5 mg Inhalation Q6H PRN Angiulli, Lavon Paganini, PA-C      . allopurinol (ZYLOPRIM) tablet 300 mg  300 mg Oral Daily Cathlyn Parsons, PA-C   300 mg at 02/01/18 0820  . aspirin suppository 300 mg  300 mg Rectal  Daily Angiulli, Lavon Paganini, PA-C       Or  . aspirin tablet 325 mg  325 mg Oral Daily Cathlyn Parsons, PA-C   325 mg at 02/01/18 0820  . atorvastatin (LIPITOR) tablet 80 mg  80 mg Oral q1800 Cathlyn Parsons, PA-C   80 mg at 01/31/18 1712  . baclofen (LIORESAL) tablet 10 mg  10 mg Oral QHS Charlett Blake, MD   10 mg at 01/31/18 2104  . carvedilol (COREG) tablet 6.25 mg  6.25 mg Oral BID WC Kirsteins, Luanna Salk, MD   6.25 mg at 02/01/18 0175  . clopidogrel (PLAVIX) tablet 75 mg  75 mg Oral Daily Cathlyn Parsons, PA-C   75 mg at 02/01/18 0820  . docusate sodium (COLACE) capsule 200 mg  200 mg Oral TID Charlett Blake, MD   200 mg at 02/01/18 1025  . DULoxetine (CYMBALTA) DR capsule 60 mg  60 mg Oral Daily Angiulli, Lavon Paganini,  PA-C   60 mg at 02/01/18 0076  . enoxaparin (LOVENOX) injection 40 mg  40 mg Subcutaneous Q24H AngiulliLavon Paganini, PA-C   40 mg at 01/31/18 1210  . feeding supplement (ENSURE ENLIVE) (ENSURE ENLIVE) liquid 237 mL  237 mL Oral BID BM AngiulliLavon Paganini, PA-C   237 mL at 01/31/18 1507  . feeding supplement (PRO-STAT SUGAR FREE 64) liquid 30 mL  30 mL Oral BID Jamse Arn, MD   30 mL at 01/31/18 0827  . folic acid (FOLVITE) tablet 1 mg  1 mg Oral Daily Cathlyn Parsons, PA-C   1 mg at 02/01/18 2263  . hydrocerin (EUCERIN) cream   Topical BID Charlett Blake, MD      . hydrOXYzine (ATARAX/VISTARIL) tablet 25 mg  25 mg Oral Q6H Jamse Arn, MD   25 mg at 02/01/18 0400  . lactulose (CHRONULAC) 10 GM/15ML solution 10 g  10 g Oral BID PRN Cathlyn Parsons, PA-C   10 g at 01/30/18 0403  . levETIRAcetam (KEPPRA) tablet 500 mg  500 mg Oral BID Cathlyn Parsons, PA-C   500 mg at 02/01/18 0820  . memantine (NAMENDA) tablet 10 mg  10 mg Oral BID Cathlyn Parsons, PA-C   10 mg at 02/01/18 3354  . methocarbamol (ROBAXIN) tablet 500 mg  500 mg Oral Q6H PRN [name], MD   500 mg at 01/30/18 1634  . mometasone-formoterol (DULERA) 200-5 MCG/ACT inhaler  2 puff  2 puff Inhalation BID Cathlyn Parsons, PA-C   2 puff at 02/01/18 5625  . multivitamin with minerals tablet 1 tablet  1 tablet Oral Daily Cathlyn Parsons, PA-C   1 tablet at 02/01/18 6389  . naphazoline-pheniramine (NAPHCON-A) 0.025-0.3 % ophthalmic solution 1 drop  1 drop Left Eye QID PRN Angiulli, Lavon Paganini, PA-C      . nicotine (NICODERM CQ - dosed in mg/24 hours) patch 21 mg  21 mg Transdermal Daily Cathlyn Parsons, PA-C   21 mg at 02/01/18 3734  . pantoprazole (PROTONIX) EC tablet 40 mg  40 mg Oral Daily Cathlyn Parsons, PA-C   40 mg at 02/01/18 2876  . phenol-menthol (CEPASTAT) lozenge 1 lozenge  1 lozenge Buccal PRN Plotnikov, Aleksei V, MD      . pregabalin (LYRICA) capsule 150 mg  150 mg Oral BID Cathlyn Parsons, PA-C   150 mg at 02/01/18 8115  . senna (SENOKOT) tablet 8.6 mg  1 tablet Oral QHS Jamse Arn, MD   8.6 mg at 01/31/18 2104  . thiamine (VITAMIN B-1) tablet 100 mg  100 mg Oral Daily Cathlyn Parsons, PA-C   100 mg at 02/01/18 7262   Or  . thiamine (B-1) injection 100 mg  100 mg Intravenous Daily Angiulli, Lavon Paganini, PA-C      . triamcinolone cream (KENALOG) 0.1 %   Topical BID Kirsteins, Luanna Salk, MD      . zolpidem (AMBIEN) tablet 5 mg  5 mg Oral QHS PRN Charlett Blake, MD   5 mg at 01/31/18 2110     Discharge Medications: Please see discharge summary for a list of discharge medications.  Relevant Imaging Results:  Relevant Lab Results:   Additional Information MBT:[id number]  Casyn Becvar, Gardiner Rhyme, LCSW

## 2018-02-01 NOTE — Progress Notes (Addendum)
[facility] PHYSICAL MEDICINE & REHABILITATION PROGRESS NOTE   Subjective/Complaints: Patient disappointed that he is no longer going home.  He does think that [facility] was a good facility for him and has been there twice before.  ROS: + Pruritus.  Denies CP, SOB, N/V/D  Objective:   No results found. No results for input(s): WBC, HGB, HCT, PLT in the last 72 hours. Recent Labs    02/01/18 0618  CREATININE 0.73    Intake/Output Summary (Last 24 hours) at 02/01/2018 0950 Last data filed at 02/01/2018 0006 Gross per 24 hour  Intake 360 ml  Output 1000 ml  Net -640 ml     Physical Exam: Vital Signs Blood pressure (!) 143/69, pulse 66, temperature 98.6 F (37 C), resp. rate 19, height 6' (1.829 m), weight 98 kg, SpO2 93 %.  Constitutional: No distress . Vital signs reviewed. HENT: Normocephalic.  Atraumatic. Eyes: EOMI. No discharge. Cardiovascular: occ pauses.  No JVD. Respiratory: CTA bilaterally.  Normal effort. GI: BS +. Non-distended. Musc: No edema or tenderness in extremities. Skin: Scattered excoriations Neurologic: Alert. Motor: 5/5 in right  deltoid, bicep, tricep, grip, hip flexor, knee extensors, ankle dorsiflexor and plantar flexor Sensation reduced pinprick RIght V1, V2 Left upper extremity: Shoulder abduction, elbow flexion/extension 2+/5, wrist extension, handgrip 1/5, unchanged Left lower extremity: Hip flexion, knee extension 4/5, ankle dorsiflexion 3-/5, stable No clonus at knee , few beats at Left ankle Assessment/Plan: 1. Functional deficits secondary to RIght MCA infarct  which require 3+ hours per day of interdisciplinary therapy in a comprehensive inpatient rehab setting.  Physiatrist is providing close team supervision and 24 hour management of active medical problems listed below.  Physiatrist and rehab team continue to assess barriers to discharge/monitor patient progress toward functional and medical goals  Care  Tool:  Bathing    Body parts bathed by patient: Left arm, Chest, Abdomen, Front perineal area, Buttocks, Right upper leg, Left upper leg, Face, Right lower leg, Left lower leg, Right arm   Body parts bathed by helper: Right arm, Left lower leg     Bathing assist Assist Level: Supervision/Verbal cueing     Upper Body Dressing/Undressing Upper body dressing   What is the patient wearing?: Pull over shirt    Upper body assist Assist Level: Supervision/Verbal cueing    Lower Body Dressing/Undressing Lower body dressing      What is the patient wearing?: Pants     Lower body assist Assist for lower body dressing: Minimal Assistance - Patient > 75%     Toileting Toileting    Toileting assist Assist for toileting: Independent with assistive device     Transfers Chair/bed transfer  Transfers assist     Chair/bed transfer assist level: Minimal Assistance - Patient > 75%     Locomotion Ambulation   Ambulation assist      Assist level: Supervision/Verbal cueing Assistive device: Cane-straight Max distance: 150 ft   Walk 10 feet activity   Assist     Assist level: Supervision/Verbal cueing Assistive device: Cane-straight   Walk 50 feet activity   Assist    Assist level: Supervision/Verbal cueing Assistive device: Cane-straight    Walk 150 feet activity   Assist Walk 150 feet activity did not occur: Safety/medical concerns(medical- felt like he was going to pass out )  Assist level: Supervision/Verbal cueing Assistive device: Cane-straight    Walk 10 feet on uneven surface  activity   Assist Walk 10 feet on uneven surfaces activity did not  occur: Safety/medical concerns         Wheelchair     Assist Will patient use wheelchair at discharge?: Yes Type of Wheelchair: Manual    Wheelchair assist level: Supervision/Verbal cueing Max wheelchair distance: 150 ft    Wheelchair 50 feet with 2 turns activity    Assist         Assist Level: Supervision/Verbal cueing   Wheelchair 150 feet activity     Assist Wheelchair 150 feet activity did not occur: Safety/medical concerns(fatigue )   Assist Level: Supervision/Verbal cueing    Medical Problem List and Plan: 1.Left hemiparesissecondary to Right MCA small embolic infarcts due to chronic right ICA occlusion in the setting of cocaine abuse and suspect medical noncompliance   Cont CIR PT, OT- d/c plan to SNF Team conference today please see physician documentation under team conference tab, met with team face-to-face to discuss problems,progress, and goals. Formulized individual treatment plan based on medical history, underlying problem and comorbidities. 2. DVT Prophylaxis/Anticoagulation: Subcutaneous Lovenox. Monitor for any bleeding episodes 3. Pain Management:Lyrica 150 mg twice a day  -robaxin prn for leg spasms 4. Mood:Namenda 10 mg twice a day, Cymbalta 60 mg daily 5. Neuropsych: This patientis?  Fully capable of making decisions on hisown behalf.  6. Skin/Wound Care:Routine skin checks 7. Fluids/Electrolytes/Nutrition:Routine and out's  Creatinine within normal limits on 12/17 8. Seizure disorder. Keppra 500 mg twice a day 9. History of polysubstance abuse / abuse tobacco abuse. UDS positive for cocaine. Provide counseling. Continue NicoDerm patch 10. Diabetes mellitus with peripheral neuropathy.  Diet controlled 11. Diastolic congestive heart failure. Monitor for any signs of fluid overload Daily weights stable on 26m lasix, recorded intake 480 on 12/23 Filed Weights   01/30/18 0409 01/31/18 0500 02/01/18 0500  Weight: 98 kg 98 kg 98 kg   12.  Hypertension Vitals:   02/01/18 0508 02/01/18 0816  BP: (!) 143/69   Pulse: 66   Resp: 19   Temp: 98.6 F (37 C)   SpO2: 95% 93%  controlled 12/24 BPs on Lasix to 442mQd,  reduction of coreg given orthostatic symptoms 13. Hyperlipidemia. Lipitor 14. History of  gout. Zyloprim 300 mg daily 15. Constipation. Laxative assistance  Stool softener increased on 12/19  as pt having hemmorhoid with hard stools- last BM 12/23- hard lumps, add high fiber diet, on maximum dose Colace 200 mg 3 times daily 16.  Sleep disturbance: Improved on Ambien 5 mg, monitor for confusion 17.  Pruritus- improved  Atarax scheduled, increased on 12/17 18.  Hypoalbuminemia  Supplement initiated on 12/16  LOS: 14 days A FACE TO FADatto Kirsteins 02/01/2018, 9:50 AM

## 2018-02-01 NOTE — Patient Care Conference (Signed)
Inpatient RehabilitationTeam Conference and Plan of Care Update Date: 02/01/2018   Time: 11:30 AM    Patient Name: [name]      Medical Record Number: [id number]  Date of Birth: 12-03-1950 Sex: Male         Room/Bed: 4W25C/4W25C-01 Payor Info: Payor: [employer] MEDICARE / Plan: UHC MEDICARE / Product Type: *No Product type* /    Admitting Diagnosis: cva  Admit Date/Time:  01/18/2018  3:08 PM Admission Comments: No comment available   Primary Diagnosis:  <principal problem not specified> Principal Problem: <principal problem not specified>  Patient Active Problem List   Diagnosis Date Noted  . Hypoalbuminemia due to protein-calorie malnutrition (Searingtown)   . Pruritus   . Labile blood pressure   . Diabetes mellitus type 2 in obese (Inwood)   . Seizures (Curlew)   . Right middle cerebral artery stroke (Oronoco) 01/18/2018  . AMS (altered mental status) 11/29/2017  . Acute encephalopathy 11/28/2017  . Left shoulder pain 09/28/2017  . Cerebral thrombosis with cerebral infarction 07/18/2016  . Acute ischemic stroke ([place])   . Left hemiplegia (Basalt)   . Stroke-like symptoms 07/17/2016  . Insomnia 01/29/2016  . Depression 01/29/2016  . Alcohol abuse with intoxication (Weatherby Lake) 10/16/2015  . Psoriasiform dermatitis 07/12/2015  . Dementia due to another medical condition (Lakeside) 07/12/2015  . Urinary incontinence 03/14/2015  . Type 2 diabetes mellitus with peripheral neuropathy (Winters) 03/13/2015  . Hemiparesis affecting left side as late effect of cerebrovascular accident (Clyde) 02/18/2015  . Cerebrovascular accident (CVA) due to stenosis of right carotid artery (Wynot)   . Coronary artery disease involving native coronary artery of native heart without angina pectoris   . Tachypnea   . Thrombocytopenia ([place])   . History of stroke   . Stroke (cerebrum) (Cabell) 02/06/2015  . Arterial ischemic stroke, MCA (middle cerebral artery), right, acute (Jack[place])   . DOE (dyspnea on exertion) 11/23/2014  .  PAF (paroxysmal atrial fibrillation) (Lewisville) 11/23/2014  . HLD (hyperlipidemia) 11/01/2014  . Tobacco use disorder 11/01/2014  . Diastolic CHF, acute on chronic (HCC) 11/01/2014  . Cerebral infarction due to stenosis of right carotid artery ([place]) 11/01/2014  . Carotid stenosis 11/01/2014  . Hyperkalemia 10/24/2014  . H/O total knee replacement 10/24/2014  . Essential hypertension   . Polysubstance abuse ([place]) 08/28/2014  . Cocaine abuse ([place])   . Intracranial carotid stenosis   . Headache around the eyes 08/27/2014  . Acute CVA (cerebrovascular accident) (Johnsburg) 08/27/2014  . CVA (cerebral vascular accident) ([place]) 08/26/2014  . History of DVT (deep vein thrombosis)   . Alcoholism (Lincolnville)   . Embolic stroke (Amsterdam)   . Cerebral infarction due to embolism of right middle cerebral artery (Crows Landing)   . Hypotension   . AKI (acute kidney injury) (Brewerton)   . Stroke (Minden)   . CVA (cerebral infarction) 08/01/2014  . Alcohol dependence with withdrawal with complication ([place]arker) 08/09/1599  . DVT (deep venous thrombosis) (Keene) 02/22/2014  . Lower extremity edema 02/21/2014  . Chronic diastolic congestive heart failure (Prescott) 11/21/2013  . CAD (coronary artery disease) 11/21/2013  . Alcohol abuse 11/21/2013  . GERD (gastroesophageal reflux disease) 08/10/2013  . Gout 08/10/2013  . Tobacco abuse 07/26/2013  . Trichiasis without entropion 04/16/2013  . Dizziness 04/10/2013  . Eyelid lesion 01/24/2013  . Enophthalmos due to trauma 01/19/2013  . Medial orbital wall fracture 01/19/2013  . Closed blow-out fracture of floor of orbit (Excels[place]) 01/19/2013  . Binocular vision disorder with diplopia 01/03/2013  .  Fracture of inferior orbital wall (Matherville) 01/03/2013  . Fracture of orbital floor (Palmyra) 01/03/2013  . Pain of left eye 01/03/2013  . Peripheral vascular disease ([place]) 07/27/2012  . COPD (chronic obstructive pulmonary disease) (Lakewood) 04/04/2012  . HTN (hypertension) 04/04/2012    Expected Discharge Date:  Expected Discharge Date: 02/01/18  Team Members Present: Physician leading conference: Dr. Alysia Penna Social Worker Present: Ovidio Kin, LCSW Nurse Present: Dorien Chihuahua, RN PT Present: Michaelene Song, PT OT Present: Roanna Epley, COTA SLP Present: Stormy Fabian, SLP PPS Coordinator present : Daiva Nakayama, RN, CRRN     Current Status/Progress Goal Weekly Team Focus  Medical   BPs slowly improving, BP meds adjjusted  reduce fall and recurrent stroke risk  Cont rehab   Bowel/Bladder        cont B & B     Swallow/Nutrition/ Hydration             ADL's     CGA-min assist level   supervision-min assist level   tolerance and BP issues-MD aware  Mobility   supervision-CGA for household distance gait and mobility with SPC  supervision overall  OOB tolerance, gait, endurance, balance, strength    Communication             Safety/Cognition/ Behavioral Observations            Pain             Skin                *See Care Plan and progress notes for long and short-term goals.     Barriers to Discharge  Current Status/Progress Possible Resolutions Date Resolved   Physician    Medical stability     Minimal additional progress  SNF is now d/c plan      Nursing                  PT  Lack of/limited family support;Medical stability  no family support, orthostatic with standing mobility              OT                  SLP                SW                Discharge Planning/Teaching Needs:  Plan now is NHP and and will begin looking has been to [employer] before will need insurance approval      Team Discussion:  Discharge plan fell through and will need assistance now is needing NHP for more rehab. BP up and down according to MD still adjusting this. Wears binder and teds helps some. IV fluid at night for hydratiobn. No speech due to reached goals.  Revisions to Treatment Plan:  NHP    Continued Need for Acute Rehabilitation Level of Care: The  patient requires daily medical management by a physician with specialized training in physical medicine and rehabilitation for the following conditions: Daily direction of a multidisciplinary physical rehabilitation program to ensure safe treatment while eliciting the highest outcome that is of practical value to the patient.: Yes Daily medical management of patient stability for increased activity during participation in an intensive rehabilitation regime.: Yes Daily analysis of laboratory values and/or radiology reports with any subsequent need for medication adjustment of medical intervention for : Neurological problems   I attest that I was present, lead the team  conference, and concur with the assessment and plan of the team.   Elease Hashimoto 02/01/2018, 1:06 PM

## 2018-02-03 ENCOUNTER — Inpatient Hospital Stay (HOSPITAL_COMMUNITY): Payer: Self-pay | Admitting: Physical Therapy

## 2018-02-03 ENCOUNTER — Encounter (HOSPITAL_COMMUNITY): Payer: Self-pay | Admitting: Occupational Therapy

## 2018-02-03 DIAGNOSIS — D62 Acute posthemorrhagic anemia: Secondary | ICD-10-CM

## 2018-02-03 NOTE — Progress Notes (Signed)
[place] PHYSICAL MEDICINE & REHABILITATION PROGRESS NOTE   Subjective/Complaints: Patient seen laying in bed this morning.  He states he slept well overnight.  ROS: Denies CP, SOB, N/V/D  Objective:   No results found. No results for input(s): WBC, HGB, HCT, PLT in the last 72 hours. Recent Labs    02/01/18 0618  CREATININE 0.73    Intake/Output Summary (Last 24 hours) at 02/03/2018 0836 Last data filed at 02/03/2018 0828 Gross per 24 hour  Intake 906 ml  Output 1800 ml  Net -894 ml     Physical Exam: Vital Signs Blood pressure (!) 144/64, pulse 71, temperature 98 F (36.7 C), resp. rate 17, height 6' (1.829 m), weight 99.5 kg, SpO2 96 %.  Constitutional: No distress . Vital signs reviewed. HENT: Normocephalic.  Atraumatic. Eyes: EOMI. No discharge. Cardiovascular: RRR.  No JVD. Respiratory: CTA bilaterally.  Normal effort. GI: BS +. Non-distended. Musc: No edema or tenderness in extremities. Skin: Scattered excoriations Neurologic: Alert. Motor: 5/5 in right  deltoid, bicep, tricep, grip, hip flexor, knee extensors, ankle dorsiflexor and plantar flexor Left upper extremity: Shoulder abduction, elbow flexion/extension 2/5, wrist extension, handgrip 1/5, unchanged Left lower extremity: Hip flexion, knee extension 4-/5, ankle dorsiflexion 3-/5, stable   Assessment/Plan: 1. Functional deficits secondary to RIght MCA infarct  which require 3+ hours per day of interdisciplinary therapy in a comprehensive inpatient rehab setting.  Physiatrist is providing close team supervision and 24 hour management of active medical problems listed below.  Physiatrist and rehab team continue to assess barriers to discharge/monitor patient progress toward functional and medical goals  Care Tool:  Bathing    Body parts bathed by patient: Left arm, Chest, Abdomen, Front perineal area, Buttocks, Right upper leg, Left upper leg, Face, Right lower leg, Left lower leg, Right arm    Body parts bathed by helper: Right arm, Left lower leg     Bathing assist Assist Level: Supervision/Verbal cueing     Upper Body Dressing/Undressing Upper body dressing   What is the patient wearing?: Pull over shirt    Upper body assist Assist Level: Supervision/Verbal cueing    Lower Body Dressing/Undressing Lower body dressing      What is the patient wearing?: Pants     Lower body assist Assist for lower body dressing: Minimal Assistance - Patient > 75%     Toileting Toileting    Toileting assist Assist for toileting: Independent with assistive device     Transfers Chair/bed transfer  Transfers assist     Chair/bed transfer assist level: Minimal Assistance - Patient > 75%     Locomotion Ambulation   Ambulation assist      Assist level: Supervision/Verbal cueing Assistive device: Cane-straight Max distance: 150 ft   Walk 10 feet activity   Assist     Assist level: Supervision/Verbal cueing Assistive device: Cane-straight   Walk 50 feet activity   Assist    Assist level: Supervision/Verbal cueing Assistive device: Cane-straight    Walk 150 feet activity   Assist Walk 150 feet activity did not occur: Safety/medical concerns(medical- felt like he was going to pass out )  Assist level: Supervision/Verbal cueing Assistive device: Cane-straight    Walk 10 feet on uneven surface  activity   Assist Walk 10 feet on uneven surfaces activity did not occur: Safety/medical concerns         Wheelchair     Assist Will patient use wheelchair at discharge?: Yes Type of Wheelchair: Manual    Wheelchair assist  level: Supervision/Verbal cueing Max wheelchair distance: 150 ft    Wheelchair 50 feet with 2 turns activity    Assist        Assist Level: Supervision/Verbal cueing   Wheelchair 150 feet activity     Assist Wheelchair 150 feet activity did not occur: Safety/medical concerns(fatigue )   Assist Level:  Supervision/Verbal cueing    Medical Problem List and Plan: 1.Left hemiparesissecondary to Right MCA small embolic infarcts due to chronic right ICA occlusion in the setting of cocaine abuse and suspect medical noncompliance   Cont CIR   Plan for SNF 2. DVT Prophylaxis/Anticoagulation: Subcutaneous Lovenox. Monitor for any bleeding episodes 3. Pain Management:Lyrica 150 mg twice a day  -robaxin prn for leg spasms 4. Mood:Namenda 10 mg twice a day, Cymbalta 60 mg daily 5. Neuropsych: This patientis?  Fully capable of making decisions on hisown behalf.  6. Skin/Wound Care:Routine skin checks 7. Fluids/Electrolytes/Nutrition:Routine and out's  Creatinine within normal limits on 12/17, labs ordered for tomorrow 8. Seizure disorder. Keppra 500 mg twice a day 9. History of polysubstance abuse / abuse tobacco abuse. UDS positive for cocaine. Provide counseling. Continue NicoDerm patch 10. Diabetes mellitus with peripheral neuropathy.   Diet controlled 11. Diastolic congestive heart failure. Monitor for any signs of fluid overload Filed Weights   02/01/18 0500 02/02/18 0500 02/03/18 0459  Weight: 98 kg 98 kg 99.5 kg   Relatively stable 12.  Hypertension Vitals:   02/02/18 2012 02/03/18 0459  BP:  (!) 144/64  Pulse: 74 71  Resp: 16 17  Temp:  98 F (36.7 C)  SpO2: 94% 96%   Lasix to 40mg  Qd  Reduction of coreg given orthostatic symptoms  Slightly elevated this a.m., was relatively controlled 13. Hyperlipidemia. Lipitor 14. History of gout. Zyloprim 300 mg daily 15. Constipation. Laxative assistance  Stool softener 16.  Sleep disturbance: Improved on Ambien 5 mg, monitor for confusion 17.  Pruritus- improved  Atarax scheduled, increased on 12/17 18.  Hypoalbuminemia  Supplement initiated on 12/16 19.  Acute blood loss anemia  Hemoglobin 12.2 on 12/11  Labs ordered for tomorrow  LOS: 16 days A FACE TO FACE EVALUATION WAS PERFORMED   Lorie Phenix 02/03/2018, 8:36 AM

## 2018-02-03 NOTE — Progress Notes (Signed)
Physical Therapy Weekly Progress Note  Patient Details  Name: [name] MRN: [id number] Date of Birth: 1950-05-13  Beginning of progress report period: January 26, 2018 End of progress report period: February 03, 2018  Today's Date: 02/03/2018 PT Individual Time: 1100-1140 AND 1425-1525 PT Individual Time Calculation (min): 40 min AND 60 min  Patient has met 5 of 14 long term goals. Pt is performing all mobility w/ CGA to supervision level assist, but remains limited orthostasis. He is ambulating in controlled environments w/ SPC, but requires frequent seated rest breaks 2/2 global LE weakness and fatigue.   Patient continues to demonstrate the following deficits muscle weakness, decreased cardiorespiratoy endurance, impaired timing and sequencing, unbalanced muscle activation, decreased coordination and decreased motor planning and decreased standing balance, decreased postural control, hemiplegia and decreased balance strategies and therefore will continue to benefit from skilled PT intervention to increase functional independence with mobility.   Pt and treatment team planning to pursue either SNF placement or d/c home after 02/09/2018, when pt's previous home health aide can start again. She is able to be present 5 days/week, 3-4 hours each day, and both pt and aide are aware the treatment team is recommending 24/7 supervision. Will await pt's decision.   Patient progressing toward long term goals.  Continue plan of care.  PT Short Term Goals Week 2:  PT Short Term Goal 1 (Week 2): STG=LTG due to ELOS Week 3:  PT Short Term Goal 1 (Week 3): =LTGs due to ELOS  Skilled Therapeutic Interventions/Progress Updates:   Session 1:  Pt in w/c and agreeable to therapy, denies pain. BP 136/79, asymptomatic w/ abdominal binder and thigh-high TEDs donned. Ambulated 100' x2 w/ CGA using SPC. Prolonged seated rest break in between 2/2 fatigue and LE weakness w/ prolonged standing activity.  Pt's previous home health aide arrived at this time and she returned to pt's room w/ [place]. Pt very concerned about d/c plan. Doesn't know if he should pursue SNF or home w/ aide. Per aide [name]), she is planning to get pt a new charger for his power w/c so he can use it for in-home mobility when she is not present. She also expressed concerns about pt being home alone at this time and encouraged pt to pursue SNF placement until he can spend time home alone safely. Will make treatment team aware of update regarding power cord. Pt requesting to further discuss d/c plan w/ aide while she is present, missed 20 min of skilled PT. Ended session in w/c, all needs in reach.   Session 2:  Pt in supine and agreeable to therapy, denies pain. BP 122/58 in supine, 118/69 at EOB and asymptomatic. Session focused on overall OOB tolerance. Total assist w/c transport to/from therapy gym. nustep 10 min @ level 3 for LE strengthening and global endurance. Attempted to work on standing tolerance while performing unilateral UE tasks at high/low table. Pt refused further standing activity, stating "my legs are too weak today". Worked on cognitive tasks from seated level remainder of session to work on overall OOB tolerance, occasional verbal and visual cues for problem solving and sequencing. Returned to room and ended session in supine, all needs in reach.   Therapy Documentation Precautions:  Precautions Precautions: Fall, Other (comment) Precaution Comments: L hemiparesis UE>LE, positive orthostatics- watch BP  Restrictions Weight Bearing Restrictions: No General: PT Amount of Missed Time (min): 20 Minutes PT Missed Treatment Reason: Unavailable (Comment)(pt's aide present, pt requesting time to talk w/ her about  d/c plan) Vital Signs: Therapy Vitals Pulse Rate: 76 Resp: 18 BP: 136/79 Patient Position (if appropriate): Sitting Oxygen Therapy SpO2: 94 % O2 Device: Room Air  Therapy/Group: Individual  Therapy   Clent Demark 02/03/2018, 11:42 AM

## 2018-02-03 NOTE — Progress Notes (Signed)
Occupational Therapy Session Note  Patient Details  Name: [name] MRN: [id number] Date of Birth: December 17, 1950  Today's Date: 02/03/2018 OT Group Time: 0900-1000 OT Group Time Calculation (min): 60 min    Short Term Goals: Week 1:  OT Short Term Goal 1 (Week 1): Pt will be able to complete stand pivot to toilet with close S. OT Short Term Goal 1 - Progress (Week 1): Progressing toward goal OT Short Term Goal 2 (Week 1): Pt will be able to bathe with long handled sponge with CGA. OT Short Term Goal 2 - Progress (Week 1): Met OT Short Term Goal 3 (Week 1): Pt will don shirt with (with A to lift L arm) S. OT Short Term Goal 3 - Progress (Week 1): Progressing toward goal OT Short Term Goal 4 (Week 1): Pt will don pants with min A. OT Short Term Goal 4 - Progress (Week 1): Met OT Short Term Goal 5 (Week 1): Pt will use L arm as a stabilizer with mod cues.  OT Short Term Goal 5 - Progress (Week 1): Met Week 2:  OT Short Term Goal 1 (Week 2): Pt will be able to complete stand pivot to toilet with close S. OT Short Term Goal 2 (Week 2): Pt will don shirt with (with A to lift L arm) S. OT Short Term Goal 3 (Week 2): Pt will perform 3/3 toileting tasks with CGA  Skilled Therapeutic Interventions/Progress Updates:    1:1 Engaged in functional group with focus, sit to stands, activity tolerance of getting up and down, dynamic standing balance and ambulating forwards and backwards with min guard to supervision while playing game of life size connect four and corn hole. PT also engaged in using reacher to retrieve items off the floor from standing position with close supervision.  Pt propelled back to his room with bilateral feet and right UE with distant supervision with extra time.    Therapy Documentation Precautions:  Precautions Precautions: Fall, Other (comment) Precaution Comments: L hemiparesis UE>LE, positive orthostatics- watch BP  Restrictions Weight Bearing Restrictions:  No    Vital Signs: Therapy Vitals Pulse Rate: 70 BP: 137/61  BP remained wFL throughout session with TEDS and abdominal binder donned Pain:  no c/o pain in session    Therapy/Group: Group Therapy  Willeen Cass [facility] 02/03/2018, 10:30 AM

## 2018-02-03 NOTE — Progress Notes (Signed)
Physical Therapy Discharge Summary  Patient Details  Name: [name] MRN: [id number] Date of Birth: 09/27/1950  Patient has met 14 of 14 long term goals due to improved activity tolerance, improved balance, improved postural control, increased strength, ability to compensate for deficits, improved attention, improved awareness and improved coordination. Patient to discharge at an ambulatory level Supervision. Patient's care partner unavailable to provide the necessary physical assistance at discharge. Pt's aide able to provide PRN assist only.   Reasons goals not met: N/A   Recommendation:  Patient will benefit from ongoing skilled PT services in skilled nursing facility setting to continue to advance safe functional mobility, address ongoing impairments in functional balance, global strength and endurance, and quality of gait, and minimize fall risk.  Equipment: No equipment provided  Reasons for discharge: treatment goals met and discharge from hospital  Patient/family agrees with progress made and goals achieved: Yes  PT Discharge Precautions/Restrictions   fall risk, orthostatics, TEDS/abdominal binder, dense hemi  Pain   0/10 Vision/Perception     Occasional diploplia, disconjugation L eye; perception intact  Cognition   A&Ox4  Sensation   intact  Motor     Gross hemiparesis L side (UE>LE), generalized weakness requiring further rehab in SNF setting  Mobility   S for all mobility with SPC; at times requires min guard when having orthostatic episode, thus benefiting from ongoing care in SNF setting  Locomotion     S with SPC 50ft Trunk/Postural Assessment     WNL  Balance   S-Min guard with SPC  Extremity Assessment             Kristen Unger PT, DPT, CBIS  Supplemental Physical Therapist [place]    Pager [phone] Acute Rehab Office [phone]    

## 2018-02-04 ENCOUNTER — Inpatient Hospital Stay (HOSPITAL_COMMUNITY): Payer: Self-pay

## 2018-02-04 ENCOUNTER — Inpatient Hospital Stay (HOSPITAL_COMMUNITY): Payer: Self-pay | Admitting: Physical Therapy

## 2018-02-04 DIAGNOSIS — D62 Acute posthemorrhagic anemia: Secondary | ICD-10-CM | POA: Diagnosis not present

## 2018-02-04 DIAGNOSIS — F0280 Dementia in other diseases classified elsewhere without behavioral disturbance: Secondary | ICD-10-CM

## 2018-02-04 DIAGNOSIS — R609 Edema, unspecified: Secondary | ICD-10-CM | POA: Diagnosis not present

## 2018-02-04 DIAGNOSIS — I739 Peripheral vascular disease, unspecified: Secondary | ICD-10-CM | POA: Diagnosis not present

## 2018-02-04 DIAGNOSIS — J449 Chronic obstructive pulmonary disease, unspecified: Secondary | ICD-10-CM | POA: Diagnosis not present

## 2018-02-04 DIAGNOSIS — B350 Tinea barbae and tinea capitis: Secondary | ICD-10-CM | POA: Diagnosis not present

## 2018-02-04 DIAGNOSIS — M25512 Pain in left shoulder: Secondary | ICD-10-CM | POA: Diagnosis not present

## 2018-02-04 DIAGNOSIS — I6930 Unspecified sequelae of cerebral infarction: Secondary | ICD-10-CM | POA: Diagnosis not present

## 2018-02-04 DIAGNOSIS — R2681 Unsteadiness on feet: Secondary | ICD-10-CM | POA: Diagnosis not present

## 2018-02-04 DIAGNOSIS — I480 Paroxysmal atrial fibrillation: Secondary | ICD-10-CM | POA: Diagnosis not present

## 2018-02-04 DIAGNOSIS — K5900 Constipation, unspecified: Secondary | ICD-10-CM | POA: Diagnosis not present

## 2018-02-04 DIAGNOSIS — I63411 Cerebral infarction due to embolism of right middle cerebral artery: Secondary | ICD-10-CM | POA: Diagnosis not present

## 2018-02-04 DIAGNOSIS — G9340 Encephalopathy, unspecified: Secondary | ICD-10-CM | POA: Diagnosis not present

## 2018-02-04 DIAGNOSIS — E1169 Type 2 diabetes mellitus with other specified complication: Secondary | ICD-10-CM | POA: Diagnosis not present

## 2018-02-04 DIAGNOSIS — K219 Gastro-esophageal reflux disease without esophagitis: Secondary | ICD-10-CM | POA: Diagnosis not present

## 2018-02-04 DIAGNOSIS — R262 Difficulty in walking, not elsewhere classified: Secondary | ICD-10-CM | POA: Diagnosis not present

## 2018-02-04 DIAGNOSIS — L853 Xerosis cutis: Secondary | ICD-10-CM | POA: Diagnosis not present

## 2018-02-04 DIAGNOSIS — I639 Cerebral infarction, unspecified: Secondary | ICD-10-CM | POA: Diagnosis not present

## 2018-02-04 DIAGNOSIS — G2581 Restless legs syndrome: Secondary | ICD-10-CM | POA: Diagnosis not present

## 2018-02-04 DIAGNOSIS — L299 Pruritus, unspecified: Secondary | ICD-10-CM | POA: Diagnosis not present

## 2018-02-04 DIAGNOSIS — E118 Type 2 diabetes mellitus with unspecified complications: Secondary | ICD-10-CM | POA: Diagnosis not present

## 2018-02-04 DIAGNOSIS — I2510 Atherosclerotic heart disease of native coronary artery without angina pectoris: Secondary | ICD-10-CM | POA: Diagnosis not present

## 2018-02-04 DIAGNOSIS — E860 Dehydration: Secondary | ICD-10-CM | POA: Diagnosis not present

## 2018-02-04 DIAGNOSIS — R21 Rash and other nonspecific skin eruption: Secondary | ICD-10-CM | POA: Diagnosis not present

## 2018-02-04 DIAGNOSIS — I10 Essential (primary) hypertension: Secondary | ICD-10-CM | POA: Diagnosis not present

## 2018-02-04 DIAGNOSIS — I5033 Acute on chronic diastolic (congestive) heart failure: Secondary | ICD-10-CM | POA: Diagnosis not present

## 2018-02-04 DIAGNOSIS — I69398 Other sequelae of cerebral infarction: Secondary | ICD-10-CM | POA: Diagnosis not present

## 2018-02-04 DIAGNOSIS — L03213 Periorbital cellulitis: Secondary | ICD-10-CM | POA: Diagnosis not present

## 2018-02-04 DIAGNOSIS — M1000 Idiopathic gout, unspecified site: Secondary | ICD-10-CM | POA: Diagnosis not present

## 2018-02-04 DIAGNOSIS — R52 Pain, unspecified: Secondary | ICD-10-CM | POA: Diagnosis not present

## 2018-02-04 DIAGNOSIS — M2550 Pain in unspecified joint: Secondary | ICD-10-CM | POA: Diagnosis not present

## 2018-02-04 DIAGNOSIS — R031 Nonspecific low blood-pressure reading: Secondary | ICD-10-CM | POA: Diagnosis not present

## 2018-02-04 DIAGNOSIS — M6281 Muscle weakness (generalized): Secondary | ICD-10-CM | POA: Diagnosis not present

## 2018-02-04 DIAGNOSIS — I69354 Hemiplegia and hemiparesis following cerebral infarction affecting left non-dominant side: Secondary | ICD-10-CM | POA: Diagnosis not present

## 2018-02-04 DIAGNOSIS — Z7401 Bed confinement status: Secondary | ICD-10-CM | POA: Diagnosis not present

## 2018-02-04 DIAGNOSIS — E785 Hyperlipidemia, unspecified: Secondary | ICD-10-CM | POA: Diagnosis not present

## 2018-02-04 LAB — CBC WITH DIFFERENTIAL/PLATELET
Abs Immature Granulocytes: 0.02 10*3/uL (ref 0.00–0.07)
Basophils Absolute: 0 10*3/uL (ref 0.0–0.1)
Basophils Relative: 1 %
Eosinophils Absolute: 0.4 10*3/uL (ref 0.0–0.5)
Eosinophils Relative: 8 %
HCT: 34.3 % — ABNORMAL LOW (ref 39.0–52.0)
Hemoglobin: 10.3 g/dL — ABNORMAL LOW (ref 13.0–17.0)
Immature Granulocytes: 0 %
Lymphocytes Relative: 28 %
Lymphs Abs: 1.4 10*3/uL (ref 0.7–4.0)
MCH: 27 pg (ref 26.0–34.0)
MCHC: 30 g/dL (ref 30.0–36.0)
MCV: 90 fL (ref 80.0–100.0)
Monocytes Absolute: 0.5 10*3/uL (ref 0.1–1.0)
Monocytes Relative: 11 %
NEUTROS ABS: 2.6 10*3/uL (ref 1.7–7.7)
Neutrophils Relative %: 52 %
Platelets: 171 10*3/uL (ref 150–400)
RBC: 3.81 MIL/uL — ABNORMAL LOW (ref 4.22–5.81)
RDW: 15.1 % (ref 11.5–15.5)
WBC: 4.9 10*3/uL (ref 4.0–10.5)
nRBC: 0 % (ref 0.0–0.2)

## 2018-02-04 LAB — BASIC METABOLIC PANEL
Anion gap: 9 (ref 5–15)
BUN: 18 mg/dL (ref 8–23)
CO2: 27 mmol/L (ref 22–32)
Calcium: 9.6 mg/dL (ref 8.9–10.3)
Chloride: 109 mmol/L (ref 98–111)
Creatinine, Ser: 0.75 mg/dL (ref 0.61–1.24)
GFR calc Af Amer: >60 mL/min (ref >60)
GFR calc non Af Amer: >60 mL/min (ref >60)
Glucose, Bld: 113 mg/dL — ABNORMAL HIGH (ref 70–99)
Potassium: 4 mmol/L (ref 3.5–5.1)
SODIUM: 145 mmol/L (ref 135–145)

## 2018-02-04 MED ORDER — MEMANTINE HCL 10 MG PO TABS: 10.0000 mg | ORAL_TABLET | Freq: Two times a day (BID) | ORAL | 0 refills | Status: DC

## 2018-02-04 MED ORDER — NICOTINE 21 MG/24HR TD PT24: 21.0000 mg | MEDICATED_PATCH | Freq: Every day | TRANSDERMAL | 0 refills | Status: DC

## 2018-02-04 MED ORDER — CARVEDILOL 3.125 MG PO TABS: 3.1250 mg | ORAL_TABLET | Freq: Two times a day (BID) | ORAL | Status: DC

## 2018-02-04 MED ORDER — ACETAMINOPHEN 325 MG PO TABS: 650.0000 mg | ORAL_TABLET | ORAL | Status: DC | PRN

## 2018-02-04 MED ORDER — MOMETASONE FURO-FORMOTEROL FUM 200-5 MCG/ACT IN AERO: 2.0000 | INHALATION_SPRAY | Freq: Two times a day (BID) | RESPIRATORY_TRACT | Status: DC

## 2018-02-04 MED ORDER — BACLOFEN 10 MG PO TABS: 10.0000 mg | ORAL_TABLET | Freq: Every day | ORAL | 0 refills | Status: DC

## 2018-02-04 NOTE — Progress Notes (Signed)
Occupational Therapy Note  Patient Details  Name: [name] MRN: [id number] Date of Birth: 16-Mar-1950  Today's Date: 02/04/2018 OT Missed Time: 40 Minutes Missed Time Reason: Other (comment)(preparing for discharge)  Pt missed 30 mins skilled OT services.  Pt discharging later in afternoon to SNF and was busy making arrangements.    Leotis Shames Khan [facility] 02/04/2018, 2:41 PM

## 2018-02-04 NOTE — Progress Notes (Signed)
[name] PHYSICAL MEDICINE & REHABILITATION PROGRESS NOTE   Subjective/Complaints: Patient seen laying in bed this morning.  He states he slept fairly overnight.  ROS: Denies CP, SOB, N/V/D  Objective:   No results found. Recent Labs    02/04/18 0530  WBC 4.9  HGB 10.3*  HCT 34.3*  PLT 171   Recent Labs    02/04/18 0530  NA 145  K 4.0  CL 109  CO2 27  GLUCOSE 113*  BUN 18  CREATININE 0.75  CALCIUM 9.6    Intake/Output Summary (Last 24 hours) at 02/04/2018 1158 Last data filed at 02/04/2018 0747 Gross per 24 hour  Intake 840 ml  Output 1650 ml  Net -810 ml     Physical Exam: Vital Signs Blood pressure (!) 123/54, pulse 71, temperature 98.4 F (36.9 C), temperature source Oral, resp. rate 18, height 6' (1.829 m), weight 98.2 kg, SpO2 95 %.  Constitutional: No distress . Vital signs reviewed. HENT: Normocephalic.  Atraumatic. Eyes: EOMI. No discharge. Cardiovascular: RRR.  No JVD. Respiratory: CTA bilaterally.  Normal effort. GI: BS +. Non-distended. Musc: No edema or tenderness in extremities. Skin: Scattered excoriations Neurologic: Alert. Motor: 5/5 in right  deltoid, bicep, tricep, grip, hip flexor, knee extensors, ankle dorsiflexor and plantar flexor Left upper extremity: Shoulder abduction, elbow flexion/extension 2/5, wrist extension, handgrip 1/5, stable Left lower extremity: Hip flexion, knee extension 4-/5, ankle dorsiflexion 3-/5, stable   Assessment/Plan: 1. Functional deficits secondary to RIght MCA infarct  which require 3+ hours per day of interdisciplinary therapy in a comprehensive inpatient rehab setting.  Physiatrist is providing close team supervision and 24 hour management of active medical problems listed below.  Physiatrist and rehab team continue to assess barriers to discharge/monitor patient progress toward functional and medical goals  Care Tool:  Bathing    Body parts bathed by patient: Left arm, Chest, Abdomen, Front  perineal area, Buttocks, Right upper leg, Left upper leg, Face, Right lower leg, Left lower leg, Right arm   Body parts bathed by helper: Right arm, Left lower leg     Bathing assist Assist Level: Supervision/Verbal cueing     Upper Body Dressing/Undressing Upper body dressing   What is the patient wearing?: Pull over shirt    Upper body assist Assist Level: Supervision/Verbal cueing    Lower Body Dressing/Undressing Lower body dressing      What is the patient wearing?: Pants     Lower body assist Assist for lower body dressing: Minimal Assistance - Patient > 75%     Toileting Toileting    Toileting assist Assist for toileting: Independent with assistive device     Transfers Chair/bed transfer  Transfers assist     Chair/bed transfer assist level: Minimal Assistance - Patient > 75%     Locomotion Ambulation   Ambulation assist      Assist level: Contact Guard/Touching assist Assistive device: Cane-straight Max distance: 100'   Walk 10 feet activity   Assist     Assist level: Contact Guard/Touching assist Assistive device: Cane-straight   Walk 50 feet activity   Assist    Assist level: Contact Guard/Touching assist Assistive device: Cane-straight    Walk 150 feet activity   Assist Walk 150 feet activity did not occur: Safety/medical concerns(medical- felt like he was going to pass out )  Assist level: Supervision/Verbal cueing Assistive device: Cane-straight    Walk 10 feet on uneven surface  activity   Assist Walk 10 feet on uneven surfaces activity did  not occur: Safety/medical concerns         Wheelchair     Assist Will patient use wheelchair at discharge?: Yes Type of Wheelchair: Manual    Wheelchair assist level: Supervision/Verbal cueing Max wheelchair distance: 150 ft    Wheelchair 50 feet with 2 turns activity    Assist        Assist Level: Supervision/Verbal cueing   Wheelchair 150 feet activity      Assist Wheelchair 150 feet activity did not occur: Safety/medical concerns(fatigue )   Assist Level: Supervision/Verbal cueing    Medical Problem List and Plan: 1.Left hemiparesissecondary to Right MCA small embolic infarcts due to chronic right ICA occlusion in the setting of cocaine abuse and suspect medical noncompliance   Cont CIR   Possible discharge today to SNF  Patient follow-up for transitional care management in 1 month post discharge 2. DVT Prophylaxis/Anticoagulation: Subcutaneous Lovenox. Monitor for any bleeding episodes 3. Pain Management:Lyrica 150 mg twice a day  -robaxin prn for leg spasms 4. Mood:Namenda 10 mg twice a day, Cymbalta 60 mg daily 5. Neuropsych: This patientis?  Fully capable of making decisions on hisown behalf.  6. Skin/Wound Care:Routine skin checks 7. Fluids/Electrolytes/Nutrition:Routine and out's  BMP within acceptable range on 12/27 8. Seizure disorder. Keppra 500 mg twice a day 9. History of polysubstance abuse / abuse tobacco abuse. UDS positive for cocaine. Provide counseling. Continue NicoDerm patch 10. Diabetes mellitus with peripheral neuropathy.   Diet controlled 11. Diastolic congestive heart failure. Monitor for any signs of fluid overload Filed Weights   02/02/18 0500 02/03/18 0459 02/04/18 0544  Weight: 98 kg 99.5 kg 98.2 kg   Relatively stable on 12/27 12.  Hypertension Vitals:   02/04/18 0838 02/04/18 0943  BP: (!) 123/54   Pulse: 71   Resp:    Temp:    SpO2:  95%   Lasix to 40mg  Qd  Reduction of coreg given orthostatic symptoms  Relatively controlled on 12/27 13. Hyperlipidemia. Lipitor 14. History of gout. Zyloprim 300 mg daily 15. Constipation. Laxative assistance  Stool softener 16.  Sleep disturbance: Improved on Ambien 5 mg, monitor for confusion 17.  Pruritus- improved  Atarax scheduled, increased on 12/17 18.  Hypoalbuminemia  Supplement initiated on 12/16 19.  Acute blood loss  anemia  Hemoglobin 10.3 on 12/27  LOS: 17 days A FACE TO FACE EVALUATION WAS PERFORMED  [name] [name] 02/04/2018, 11:58 AM

## 2018-02-04 NOTE — Progress Notes (Signed)
Occupational Therapy Session Note  Patient Details  Name: [name] MRN: [id number] Date of Birth: 1950-12-19  Today's Date: 02/04/2018 OT Individual Time: 0900-1000 OT Individual Time Calculation (min): 60 min    Short Term Goals: Week 2:  OT Short Term Goal 1 (Week 2): Pt will be able to complete stand pivot to toilet with close S. OT Short Term Goal 2 (Week 2): Pt will don shirt with (with A to lift L arm) S. OT Short Term Goal 3 (Week 2): Pt will perform 3/3 toileting tasks with CGA  Skilled Therapeutic Interventions/Progress Updates:    OT intervention with focus on bathing/dressing with sit<>stand from w/c at sink.  Bathing at shower level deferred secondary to continued hypotension/orthostasis.  Pt sat EOB and transferred to w/c without assistance.  Pt completed bathing tasks with sit<>stand at sink without assistance after supplies provided.  Pt requires min A for LB dressing tasks with use of reacher. Pt with no s/s of dizziness this morning.  Pt requires more than a reasonable amount of time to complete tasks.  Pt transferred to toilet with use of grab bars and completed toileting tasks without assistance.  Pt remained in w/c with all needs within reach and belt alarm activated.   Therapy Documentation Precautions:  Precautions Precautions: Fall, Other (comment) Precaution Comments: L hemiparesis UE>LE, positive orthostatics- watch BP  Restrictions Weight Bearing Restrictions: No  Pain: Pain Assessment Pain Scale: 0-10 Pain Score: 0-No pain   Therapy/Group: Individual Therapy  Leroy Libman 02/04/2018, 1:46 PM

## 2018-02-04 NOTE — Progress Notes (Signed)
Physical Therapy Session Note  Patient Details  Name: [name] MRN: [id number] Date of Birth: [birth year]  Today's Date: 02/04/2018 PT Individual Time: 1100-1200 PT Individual Time Calculation (min): 60 min   Short Term Goals: Week 3:  PT Short Term Goal 1 (Week 3): =LTGs due to ELOS  Skilled Therapeutic Interventions/Progress Updates:    Patient received up in [facility], reporting he had been doing well but just recently started feeling dizzy and light-headed. Performed BP checks in sitting and standing throughout today's session with BP WNL and without significant orthostatic drop today. Able to complete functional transfers with min guard and SPC, car transfer with close min guard, deferred gait due to ongoing dizziness and light-headedness today however. Due to history of multiple CVA and ongoing dizziness, reports of intermittent double vision, rechecked VOR reflex and continue to note ongoing disconjugation L eye and difficulty on L side with VOR based activities, indicating ongoing potential central impairment contributing to dizziness. Educated on possible vestibular contribution of dizziness and provided basic VOR exercises for him to do in room. Otherwise able to tolerate riding Nustep with B LEs and R UE on resistance 3 for 10 minutes. He was left up in his WC with all needs met and seat belt alarm active at EOS this morning.   Therapy Documentation Precautions:  Precautions Precautions: Fall, Other (comment) Precaution Comments: L hemiparesis UE>LE, positive orthostatics- watch BP  Restrictions Weight Bearing Restrictions: No General:   Pain: Pain Assessment Pain Scale: 0-10 Pain Score: 0-No pain    Therapy/Group: Individual Therapy  Deniece Ree PT, DPT, CBIS  Supplemental Physical Therapist [facility]    Pager [phone] Acute Rehab Office [phone]   02/04/2018, 12:24 PM

## 2018-02-04 NOTE — Progress Notes (Signed)
Occupational Therapy Discharge Summary  Patient Details  Name: [name] MRN: [id number] Date of Birth: Aug 17, 1950   Patient has met 10 of 10 long term goals due to improved activity tolerance, improved balance, postural control, ability to compensate for deficits and improved coordination.  Pt made steady progress with BADLs during this admission.  Pt requires min A for LB dressing but completes all other aspects of bathing/dressing at supervision level.  Pt performs tub bench transfers with supervision and toilet transfers at mod I level.  Pt performs toileting tasks at mod I.  Pt requires more than a reasonable amount of time to complete all tasks.  Pt continues to exhibit s/s of orthostatic BP managed with thigh high Ted hose and abdominal binder. Pt's family or care givers have not been present for therapy.  Pt discharging to SNF before returning home with home health aide assisting daily. Patient to discharge at overall mod I to min A  level.    Recommendation:  Patient will benefit from ongoing skilled OT services in skilled nursing facility setting to continue to advance functional skills in the area of BADL, iADL and Reduce care partner burden.  Equipment: No equipment provided Discharge to SNF  Reasons for discharge: treatment goals met and discharge from hospital  Patient/family agrees with progress made and goals achieved: Yes  OT Discharge AVision Baseline Vision/History: Wears glasses Wears Glasses: Reading only Patient Visual Report: No change from baseline Vision Assessment?: No apparent visual deficits Ocular Range of Motion: Within Functional Limits Alignment/Gaze Preference: Within Defined Limits Perception  Perception: Within Functional Limits Praxis Praxis: Intact Cognition Overall Cognitive Status: History of cognitive impairments - at baseline Arousal/Alertness: Awake/alert Orientation Level: Oriented X4 Attention: Selective Focused Attention: Appears  intact Selective Attention: Appears intact Alternating Attention: Appears intact Memory: Impaired Memory Impairment: Decreased short term memory Awareness: Appears intact Problem Solving: Appears intact Problem Solving Impairment: Functional basic Safety/Judgment: Appears intact Sensation Sensation Light Touch: Appears Intact Hot/Cold: Appears Intact Proprioception: Appears Intact Stereognosis: Not tested Coordination Gross Motor Movements are Fluid and Coordinated: No Fine Motor Movements are Fluid and Coordinated: No Motor  Motor Motor: Hemiplegia Motor - Discharge Observations: L hemiparesis    Trunk/Postural Assessment  Cervical Assessment Cervical Assessment: Within Functional Limits Thoracic Assessment Thoracic Assessment: Within Functional Limits Lumbar Assessment Lumbar Assessment: Within Functional Limits Postural Control Postural Control: Within Functional Limits  Balance Static Sitting Balance Static Sitting - Level of Assistance: 6: Modified independent (Device/Increase time) Dynamic Sitting Balance Dynamic Sitting - Level of Assistance: 6: Modified independent (Device/Increase time) Extremity/Trunk Assessment RUE Assessment RUE Assessment: Within Functional Limits LUE Assessment LUE Assessment: Exceptions to [facility] LUE Body System: Neuro Brunstrum levels for arm and hand: Arm;Hand Brunstrum level for arm: Stage III Synergy is performed voluntarily Brunstrum level for hand: Stage II Synergy is developing   Leroy Libman 02/04/2018, 1:55 PM

## 2018-02-04 NOTE — Progress Notes (Signed)
Pt was discharged to EMT transport. Pt left with all personal belongings. Pt was given discharge instructions and all questions were answered prior to leaving Inpatient Rehab. Doy Hutching, LPN

## 2018-02-04 NOTE — Progress Notes (Addendum)
Social Work Patient ID: [name], male   DOB: 07/20/1950, 67 y.o.   MRN: [id number] Spoke with [facility] who is working on [facility], clinical for pt to transfer to them. Team and Dan-PA aware may transfer today if approved. Insurance has approved and will plan to take him today at [facility].

## 2018-02-04 NOTE — Progress Notes (Signed)
Occupational Therapy Note  Patient Details  Name: [name] MRN: [id number] Date of Birth: 08/30/1950  Today's Date: 02/04/2018 OT Missed Time: 35 Minutes Missed Time Reason: Other (comment);Patient unwilling/refused to participate without medical reason(preparing for d/c)  Pt found in room seated in w/c preparing for d/c. Pt refusing all intervention at this time d/t awaiting for transport to SNF. Pt missed 45 min skilled OT.   Lowella Dell Malynda Smolinski 02/04/2018, 3:19 PM

## 2018-02-07 ENCOUNTER — Inpatient Hospital Stay (HOSPITAL_COMMUNITY): Payer: Self-pay

## 2018-02-08 DIAGNOSIS — I739 Peripheral vascular disease, unspecified: Secondary | ICD-10-CM | POA: Diagnosis not present

## 2018-02-08 DIAGNOSIS — I10 Essential (primary) hypertension: Secondary | ICD-10-CM | POA: Diagnosis not present

## 2018-02-08 DIAGNOSIS — J449 Chronic obstructive pulmonary disease, unspecified: Secondary | ICD-10-CM | POA: Diagnosis not present

## 2018-02-08 DIAGNOSIS — I2510 Atherosclerotic heart disease of native coronary artery without angina pectoris: Secondary | ICD-10-CM | POA: Diagnosis not present

## 2018-02-14 DIAGNOSIS — B350 Tinea barbae and tinea capitis: Secondary | ICD-10-CM | POA: Diagnosis not present

## 2018-02-14 DIAGNOSIS — L299 Pruritus, unspecified: Secondary | ICD-10-CM | POA: Diagnosis not present

## 2018-02-14 DIAGNOSIS — R21 Rash and other nonspecific skin eruption: Secondary | ICD-10-CM | POA: Diagnosis not present

## 2018-02-15 DIAGNOSIS — L299 Pruritus, unspecified: Secondary | ICD-10-CM | POA: Diagnosis not present

## 2018-02-15 DIAGNOSIS — M6281 Muscle weakness (generalized): Secondary | ICD-10-CM | POA: Diagnosis not present

## 2018-02-15 DIAGNOSIS — I69398 Other sequelae of cerebral infarction: Secondary | ICD-10-CM | POA: Diagnosis not present

## 2018-02-15 DIAGNOSIS — K5900 Constipation, unspecified: Secondary | ICD-10-CM | POA: Diagnosis not present

## 2018-02-15 DIAGNOSIS — L853 Xerosis cutis: Secondary | ICD-10-CM | POA: Diagnosis not present

## 2018-02-15 DIAGNOSIS — R262 Difficulty in walking, not elsewhere classified: Secondary | ICD-10-CM | POA: Diagnosis not present

## 2018-02-15 DIAGNOSIS — M25512 Pain in left shoulder: Secondary | ICD-10-CM | POA: Diagnosis not present

## 2018-02-15 DIAGNOSIS — B350 Tinea barbae and tinea capitis: Secondary | ICD-10-CM | POA: Diagnosis not present

## 2018-02-17 DIAGNOSIS — I69398 Other sequelae of cerebral infarction: Secondary | ICD-10-CM | POA: Diagnosis not present

## 2018-02-17 DIAGNOSIS — M25512 Pain in left shoulder: Secondary | ICD-10-CM | POA: Diagnosis not present

## 2018-02-17 DIAGNOSIS — M6281 Muscle weakness (generalized): Secondary | ICD-10-CM | POA: Diagnosis not present

## 2018-02-17 DIAGNOSIS — R262 Difficulty in walking, not elsewhere classified: Secondary | ICD-10-CM | POA: Diagnosis not present

## 2018-02-21 DIAGNOSIS — R031 Nonspecific low blood-pressure reading: Secondary | ICD-10-CM | POA: Diagnosis not present

## 2018-02-21 DIAGNOSIS — I6930 Unspecified sequelae of cerebral infarction: Secondary | ICD-10-CM | POA: Diagnosis not present

## 2018-02-21 DIAGNOSIS — E860 Dehydration: Secondary | ICD-10-CM | POA: Diagnosis not present

## 2018-02-21 DIAGNOSIS — I10 Essential (primary) hypertension: Secondary | ICD-10-CM | POA: Diagnosis not present

## 2018-02-22 DIAGNOSIS — I69398 Other sequelae of cerebral infarction: Secondary | ICD-10-CM | POA: Diagnosis not present

## 2018-02-22 DIAGNOSIS — M6281 Muscle weakness (generalized): Secondary | ICD-10-CM | POA: Diagnosis not present

## 2018-02-22 DIAGNOSIS — R262 Difficulty in walking, not elsewhere classified: Secondary | ICD-10-CM | POA: Diagnosis not present

## 2018-02-22 DIAGNOSIS — M25512 Pain in left shoulder: Secondary | ICD-10-CM | POA: Diagnosis not present

## 2018-02-28 ENCOUNTER — Inpatient Hospital Stay: Payer: Self-pay | Admitting: Family Medicine

## 2018-02-28 DIAGNOSIS — I10 Essential (primary) hypertension: Secondary | ICD-10-CM | POA: Diagnosis not present

## 2018-02-28 DIAGNOSIS — I5033 Acute on chronic diastolic (congestive) heart failure: Secondary | ICD-10-CM | POA: Diagnosis not present

## 2018-02-28 DIAGNOSIS — I69354 Hemiplegia and hemiparesis following cerebral infarction affecting left non-dominant side: Secondary | ICD-10-CM | POA: Diagnosis not present

## 2018-03-01 DIAGNOSIS — I69398 Other sequelae of cerebral infarction: Secondary | ICD-10-CM | POA: Diagnosis not present

## 2018-03-01 DIAGNOSIS — R262 Difficulty in walking, not elsewhere classified: Secondary | ICD-10-CM | POA: Diagnosis not present

## 2018-03-01 DIAGNOSIS — M25512 Pain in left shoulder: Secondary | ICD-10-CM | POA: Diagnosis not present

## 2018-03-01 DIAGNOSIS — M6281 Muscle weakness (generalized): Secondary | ICD-10-CM | POA: Diagnosis not present

## 2018-03-02 DIAGNOSIS — L03213 Periorbital cellulitis: Secondary | ICD-10-CM | POA: Diagnosis not present

## 2018-03-02 DIAGNOSIS — R609 Edema, unspecified: Secondary | ICD-10-CM | POA: Diagnosis not present

## 2018-03-02 DIAGNOSIS — I6930 Unspecified sequelae of cerebral infarction: Secondary | ICD-10-CM | POA: Diagnosis not present

## 2018-03-02 DIAGNOSIS — R52 Pain, unspecified: Secondary | ICD-10-CM | POA: Diagnosis not present

## 2018-03-02 IMAGING — CT CT HEAD W/O CM
3 series · 15 of 47 positions shown, 18 images · non-contrast
Comparison: MRI 02/06/2015, CT brain 02/06/2015

CLINICAL DATA: Sudden left-sided numbness with elevated blood
pressure

EXAM:
CT HEAD WITHOUT CONTRAST
TECHNIQUE: Contiguous axial images were obtained from the base of the skull
through the vertex without intravenous contrast.

[Series 3: head 5.0 h30s · axial · 0.43mm/px · z∈[-151,-11]mm · 9 of 34 slices shown, 12 images]
[im 3/34  brain]
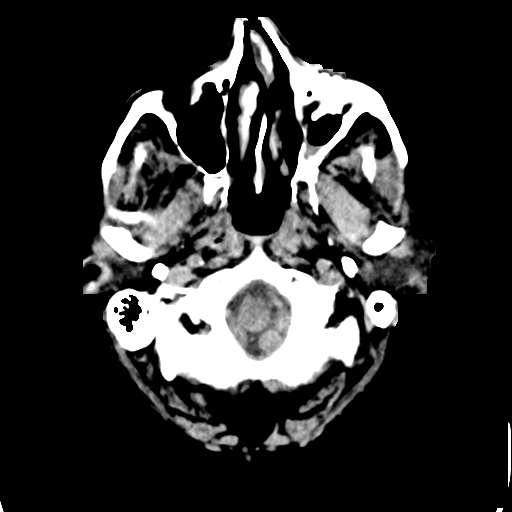
[im 3/34  bone]
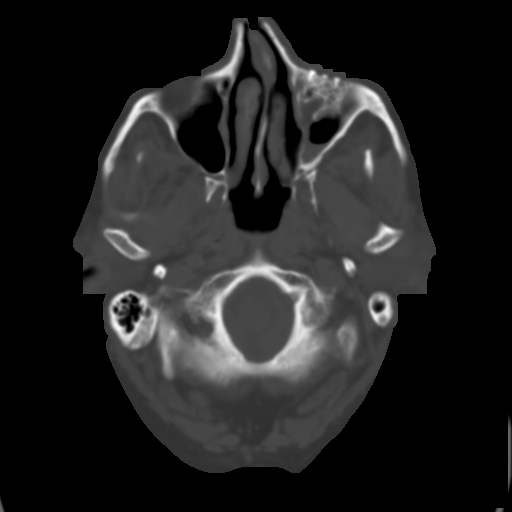
[im 6/34  brain]
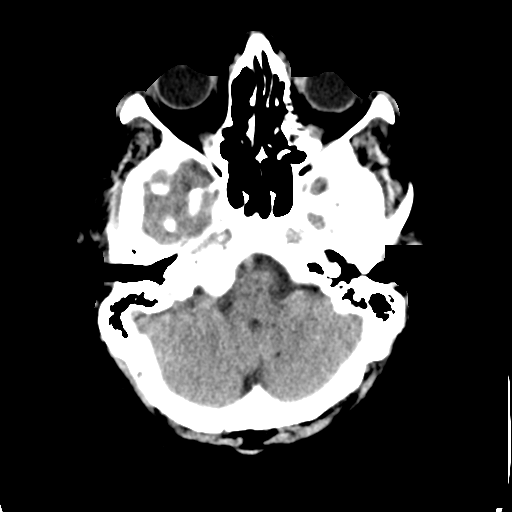
[im 10/34  brain]
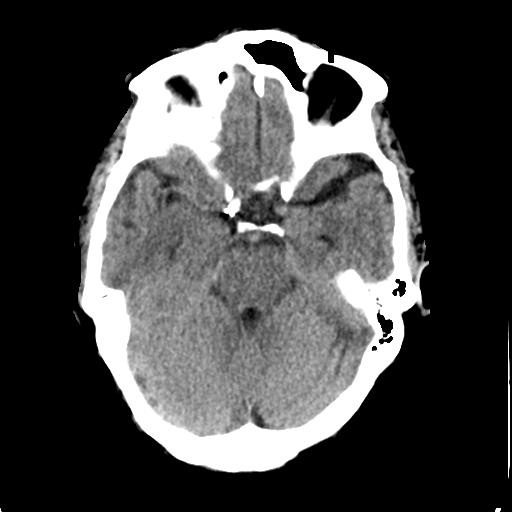
[im 13/34  brain]
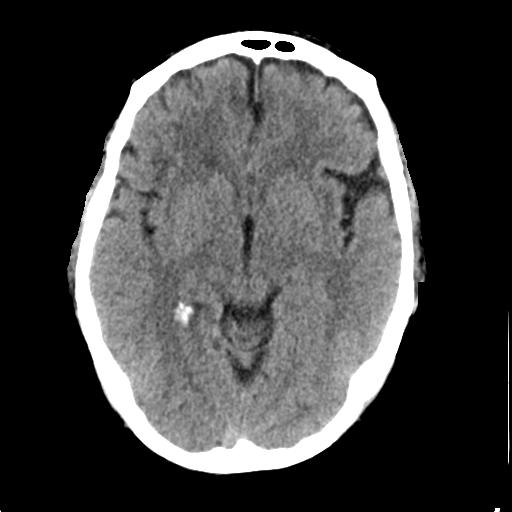
[im 18/34  brain]
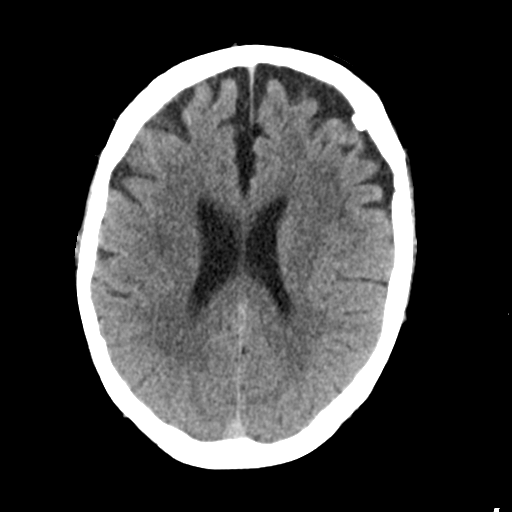
[im 18/34  bone]
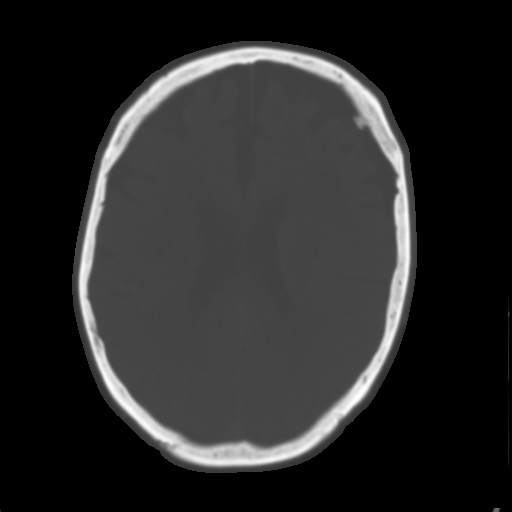
[im 21/34  brain]
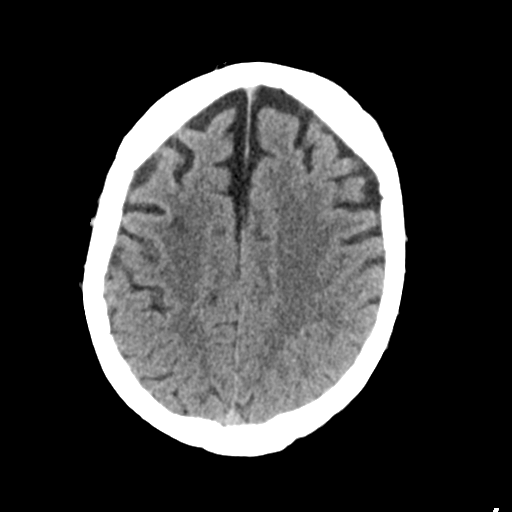
[im 24/34  brain]
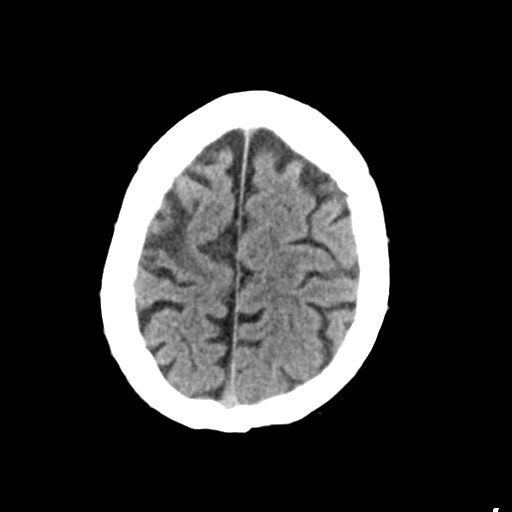
[im 28/34  brain]
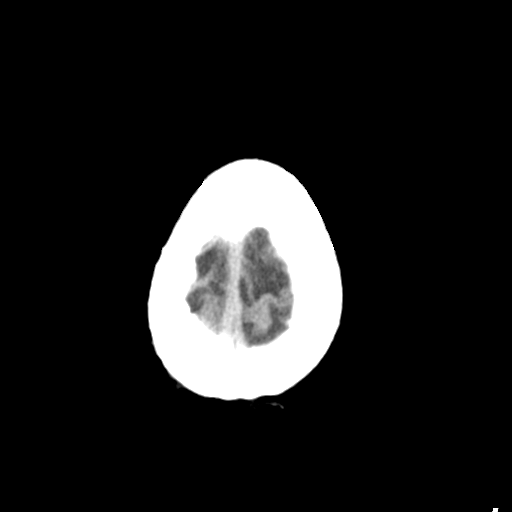
[im 31/34  brain]
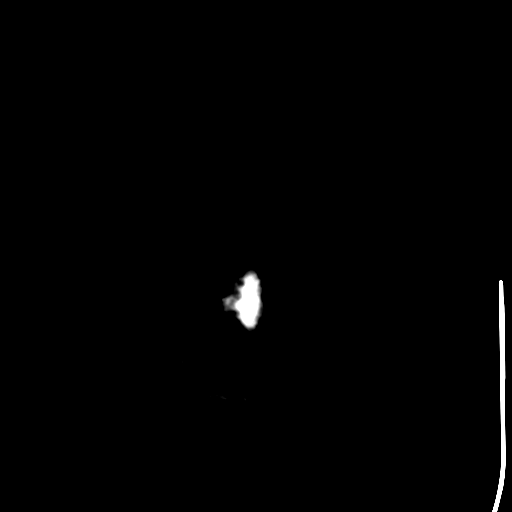
[im 31/34  bone]
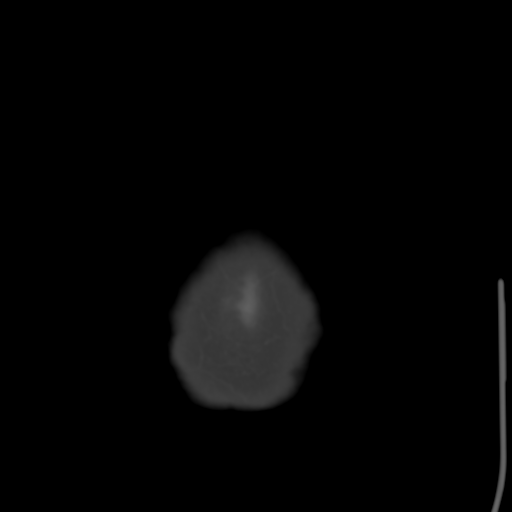

[Series 5: head 3.0 mpr cor · coronal · 0.31mm/px · 3 of 74 slices shown]
[im 25/74  brain]
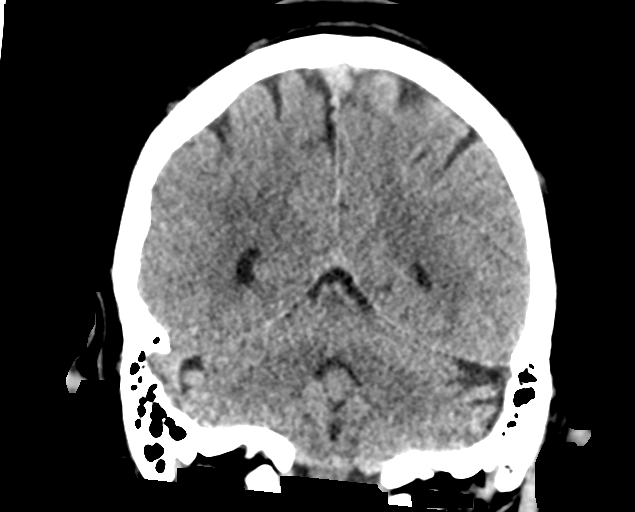
[im 33/74  brain]
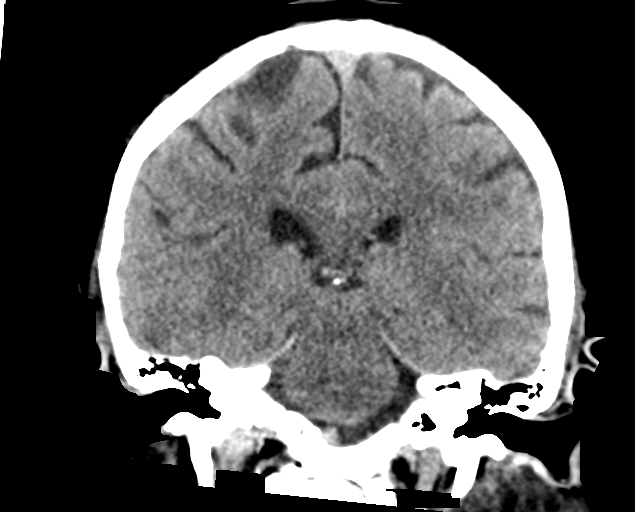
[im 41/74  brain]
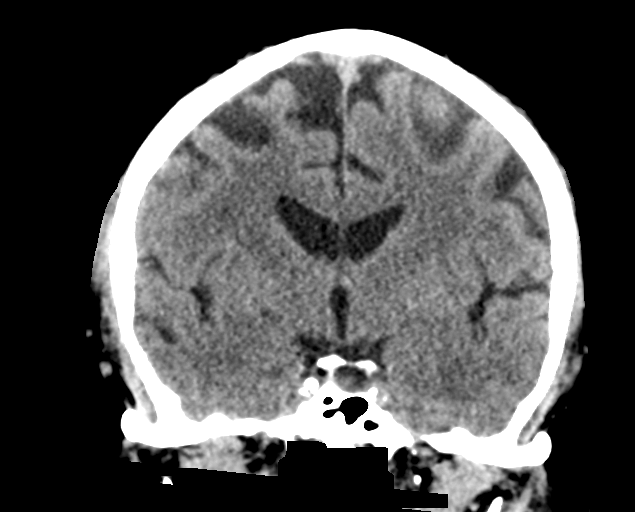

[Series 6: head 3.0 mpr sag · sagittal · 0.32mm/px · 3 of 63 slices shown]
[im 21/63  brain]
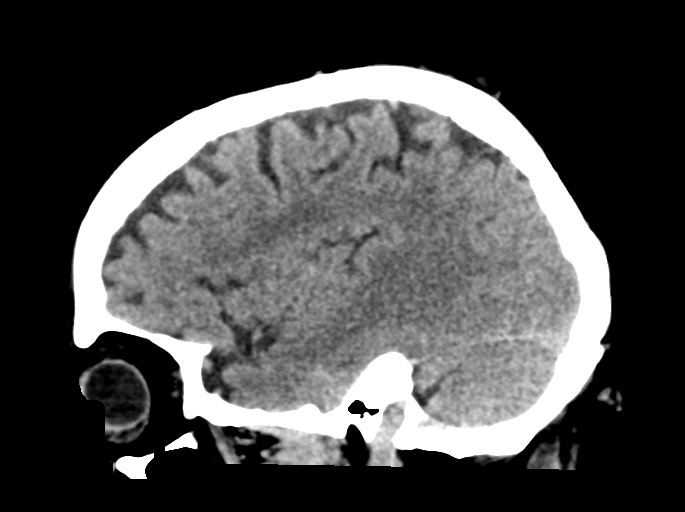
[im 32/63  brain]
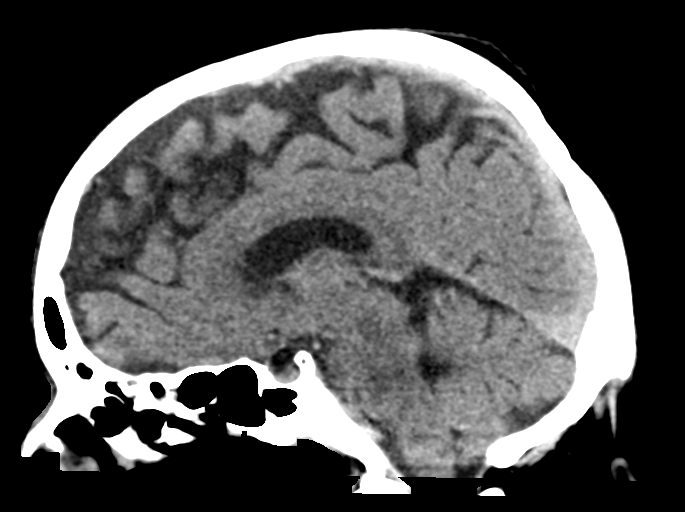
[im 42/63  brain]
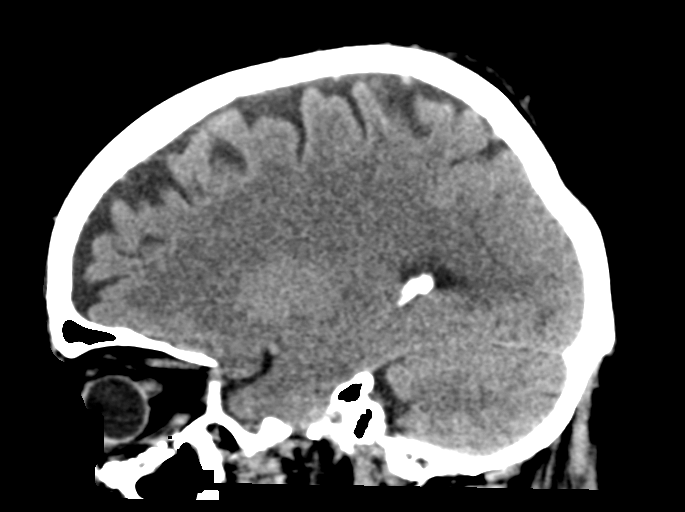

[15 of 47 positions shown; findings below may reference images not displayed]

FINDINGS: Brain: No acute territorial infarction, hemorrhage, or mass is
visualized. Small old right frontal lobe infarct near the vertex.
Mild to moderate atrophy. Stable ventricle size. Right greater than
left periventricular white matter small vessel ischemic changes. Old
appearing right basal ganglia lacune.

Vascular: No hyperdense vessels.  Carotid artery calcifications.

Skull: No acute skull fracture.  No suspicious bone lesion.

Sinuses/Orbits: Mucosal thickening in the ethmoid and maxillary
sinuses. Possible old nasal bone deformity. Postsurgical changes of
the floor and medial wall of left orbit an the anterior wall of the
left maxillary sinus.

Other: None
IMPRESSION: 1. No definite CT evidence for acute intracranial abnormality.
2. Bilateral white matter small vessel ischemic changes.

## 2018-03-03 DIAGNOSIS — I69354 Hemiplegia and hemiparesis following cerebral infarction affecting left non-dominant side: Secondary | ICD-10-CM | POA: Diagnosis not present

## 2018-03-03 DIAGNOSIS — I10 Essential (primary) hypertension: Secondary | ICD-10-CM | POA: Diagnosis not present

## 2018-03-03 DIAGNOSIS — I5033 Acute on chronic diastolic (congestive) heart failure: Secondary | ICD-10-CM | POA: Diagnosis not present

## 2018-03-08 ENCOUNTER — Inpatient Hospital Stay: Payer: Medicare Other | Admitting: Physical Medicine & Rehabilitation

## 2018-03-08 ENCOUNTER — Encounter: Payer: Medicare Other | Attending: Physical Medicine & Rehabilitation

## 2018-03-09 ENCOUNTER — Telehealth: Payer: Self-pay | Admitting: Family Medicine

## 2018-03-09 NOTE — Telephone Encounter (Signed)
RN from [facility] to inform PCP that she will begin home health on 03/12/2018. [phone]

## 2018-03-09 NOTE — Telephone Encounter (Signed)
Noted  

## 2018-03-09 NOTE — Telephone Encounter (Signed)
Will route to PCP for review. 

## 2018-03-12 DIAGNOSIS — Z9119 Patient's noncompliance with other medical treatment and regimen: Secondary | ICD-10-CM | POA: Diagnosis not present

## 2018-03-12 DIAGNOSIS — Z720 Tobacco use: Secondary | ICD-10-CM | POA: Diagnosis not present

## 2018-03-12 DIAGNOSIS — M19012 Primary osteoarthritis, left shoulder: Secondary | ICD-10-CM | POA: Diagnosis not present

## 2018-03-12 DIAGNOSIS — M109 Gout, unspecified: Secondary | ICD-10-CM | POA: Diagnosis not present

## 2018-03-12 DIAGNOSIS — I110 Hypertensive heart disease with heart failure: Secondary | ICD-10-CM | POA: Diagnosis not present

## 2018-03-12 DIAGNOSIS — I69354 Hemiplegia and hemiparesis following cerebral infarction affecting left non-dominant side: Secondary | ICD-10-CM | POA: Diagnosis not present

## 2018-03-12 DIAGNOSIS — E1169 Type 2 diabetes mellitus with other specified complication: Secondary | ICD-10-CM | POA: Diagnosis not present

## 2018-03-12 DIAGNOSIS — I480 Paroxysmal atrial fibrillation: Secondary | ICD-10-CM | POA: Diagnosis not present

## 2018-03-12 DIAGNOSIS — I5032 Chronic diastolic (congestive) heart failure: Secondary | ICD-10-CM | POA: Diagnosis not present

## 2018-03-12 DIAGNOSIS — I2510 Atherosclerotic heart disease of native coronary artery without angina pectoris: Secondary | ICD-10-CM | POA: Diagnosis not present

## 2018-03-12 DIAGNOSIS — J449 Chronic obstructive pulmonary disease, unspecified: Secondary | ICD-10-CM | POA: Diagnosis not present

## 2018-03-12 DIAGNOSIS — E1151 Type 2 diabetes mellitus with diabetic peripheral angiopathy without gangrene: Secondary | ICD-10-CM | POA: Diagnosis not present

## 2018-03-12 DIAGNOSIS — G8929 Other chronic pain: Secondary | ICD-10-CM | POA: Diagnosis not present

## 2018-03-12 DIAGNOSIS — Z7902 Long term (current) use of antithrombotics/antiplatelets: Secondary | ICD-10-CM | POA: Diagnosis not present

## 2018-03-14 ENCOUNTER — Telehealth: Payer: Self-pay

## 2018-03-14 NOTE — Telephone Encounter (Signed)
Tammy from [facility] called to get verbal orders for skilled nursing 1 week 1,3 week 1,2 wee k2,1 week 3PT and OT and disease and medication management.

## 2018-03-16 ENCOUNTER — Telehealth: Payer: Self-pay | Admitting: Family Medicine

## 2018-03-16 DIAGNOSIS — M19012 Primary osteoarthritis, left shoulder: Secondary | ICD-10-CM | POA: Diagnosis not present

## 2018-03-16 DIAGNOSIS — I110 Hypertensive heart disease with heart failure: Secondary | ICD-10-CM | POA: Diagnosis not present

## 2018-03-16 DIAGNOSIS — Z7902 Long term (current) use of antithrombotics/antiplatelets: Secondary | ICD-10-CM | POA: Diagnosis not present

## 2018-03-16 DIAGNOSIS — I480 Paroxysmal atrial fibrillation: Secondary | ICD-10-CM | POA: Diagnosis not present

## 2018-03-16 DIAGNOSIS — E1151 Type 2 diabetes mellitus with diabetic peripheral angiopathy without gangrene: Secondary | ICD-10-CM | POA: Diagnosis not present

## 2018-03-16 DIAGNOSIS — G8929 Other chronic pain: Secondary | ICD-10-CM | POA: Diagnosis not present

## 2018-03-16 DIAGNOSIS — Z720 Tobacco use: Secondary | ICD-10-CM | POA: Diagnosis not present

## 2018-03-16 DIAGNOSIS — I5032 Chronic diastolic (congestive) heart failure: Secondary | ICD-10-CM | POA: Diagnosis not present

## 2018-03-16 DIAGNOSIS — I2510 Atherosclerotic heart disease of native coronary artery without angina pectoris: Secondary | ICD-10-CM | POA: Diagnosis not present

## 2018-03-16 DIAGNOSIS — E1169 Type 2 diabetes mellitus with other specified complication: Secondary | ICD-10-CM | POA: Diagnosis not present

## 2018-03-16 DIAGNOSIS — I69354 Hemiplegia and hemiparesis following cerebral infarction affecting left non-dominant side: Secondary | ICD-10-CM | POA: Diagnosis not present

## 2018-03-16 DIAGNOSIS — Z9119 Patient's noncompliance with other medical treatment and regimen: Secondary | ICD-10-CM | POA: Diagnosis not present

## 2018-03-16 DIAGNOSIS — J449 Chronic obstructive pulmonary disease, unspecified: Secondary | ICD-10-CM | POA: Diagnosis not present

## 2018-03-16 DIAGNOSIS — M109 Gout, unspecified: Secondary | ICD-10-CM | POA: Diagnosis not present

## 2018-03-16 MED ORDER — HYDROXYZINE HCL 25 MG PO TABS: 25.0000 mg | ORAL_TABLET | Freq: Three times a day (TID) | ORAL | 0 refills | Status: DC | PRN

## 2018-03-16 NOTE — Telephone Encounter (Signed)
Juliann Pulse at [facility] called stating she is with the pt and pt has a rash on head, arms, back, legs that is causing pt to scratch a lot. Pt has not had anything new in his diet or home per [place]. Please call in medication for itching.

## 2018-03-16 NOTE — Telephone Encounter (Signed)
His rash will need to be evaluated.  I have sent a prescription for hydroxyzine to his pharmacy.

## 2018-03-16 NOTE — Telephone Encounter (Signed)
Will route to PCP for review. 

## 2018-03-17 ENCOUNTER — Telehealth: Payer: Self-pay | Admitting: Family Medicine

## 2018-03-17 DIAGNOSIS — I110 Hypertensive heart disease with heart failure: Secondary | ICD-10-CM | POA: Diagnosis not present

## 2018-03-17 DIAGNOSIS — I5032 Chronic diastolic (congestive) heart failure: Secondary | ICD-10-CM | POA: Diagnosis not present

## 2018-03-17 DIAGNOSIS — I480 Paroxysmal atrial fibrillation: Secondary | ICD-10-CM | POA: Diagnosis not present

## 2018-03-17 DIAGNOSIS — E1169 Type 2 diabetes mellitus with other specified complication: Secondary | ICD-10-CM | POA: Diagnosis not present

## 2018-03-17 DIAGNOSIS — M109 Gout, unspecified: Secondary | ICD-10-CM | POA: Diagnosis not present

## 2018-03-17 DIAGNOSIS — Z7902 Long term (current) use of antithrombotics/antiplatelets: Secondary | ICD-10-CM | POA: Diagnosis not present

## 2018-03-17 DIAGNOSIS — M19012 Primary osteoarthritis, left shoulder: Secondary | ICD-10-CM | POA: Diagnosis not present

## 2018-03-17 DIAGNOSIS — Z720 Tobacco use: Secondary | ICD-10-CM | POA: Diagnosis not present

## 2018-03-17 DIAGNOSIS — I69354 Hemiplegia and hemiparesis following cerebral infarction affecting left non-dominant side: Secondary | ICD-10-CM | POA: Diagnosis not present

## 2018-03-17 DIAGNOSIS — J449 Chronic obstructive pulmonary disease, unspecified: Secondary | ICD-10-CM | POA: Diagnosis not present

## 2018-03-17 DIAGNOSIS — G8929 Other chronic pain: Secondary | ICD-10-CM | POA: Diagnosis not present

## 2018-03-17 DIAGNOSIS — Z9119 Patient's noncompliance with other medical treatment and regimen: Secondary | ICD-10-CM | POA: Diagnosis not present

## 2018-03-17 DIAGNOSIS — I2510 Atherosclerotic heart disease of native coronary artery without angina pectoris: Secondary | ICD-10-CM | POA: Diagnosis not present

## 2018-03-17 DIAGNOSIS — E1151 Type 2 diabetes mellitus with diabetic peripheral angiopathy without gangrene: Secondary | ICD-10-CM | POA: Diagnosis not present

## 2018-03-17 NOTE — Telephone Encounter (Signed)
monique was called and given verbal orders for patient.

## 2018-03-17 NOTE — Telephone Encounter (Signed)
Michelle from [facility] called in to update on Verbal orders:  [employer]

## 2018-03-17 NOTE — Telephone Encounter (Signed)
Monique from [facility] called to request verbal orders for  Thornton Papas For strengthening and home safety. Please follow up -[phone] ok to LVM

## 2018-03-18 DIAGNOSIS — M109 Gout, unspecified: Secondary | ICD-10-CM | POA: Diagnosis not present

## 2018-03-18 DIAGNOSIS — G8929 Other chronic pain: Secondary | ICD-10-CM | POA: Diagnosis not present

## 2018-03-18 DIAGNOSIS — I480 Paroxysmal atrial fibrillation: Secondary | ICD-10-CM | POA: Diagnosis not present

## 2018-03-18 DIAGNOSIS — Z9119 Patient's noncompliance with other medical treatment and regimen: Secondary | ICD-10-CM | POA: Diagnosis not present

## 2018-03-18 DIAGNOSIS — J449 Chronic obstructive pulmonary disease, unspecified: Secondary | ICD-10-CM | POA: Diagnosis not present

## 2018-03-18 DIAGNOSIS — I2510 Atherosclerotic heart disease of native coronary artery without angina pectoris: Secondary | ICD-10-CM | POA: Diagnosis not present

## 2018-03-18 DIAGNOSIS — Z720 Tobacco use: Secondary | ICD-10-CM | POA: Diagnosis not present

## 2018-03-18 DIAGNOSIS — E1151 Type 2 diabetes mellitus with diabetic peripheral angiopathy without gangrene: Secondary | ICD-10-CM | POA: Diagnosis not present

## 2018-03-18 DIAGNOSIS — E1169 Type 2 diabetes mellitus with other specified complication: Secondary | ICD-10-CM | POA: Diagnosis not present

## 2018-03-18 DIAGNOSIS — M19012 Primary osteoarthritis, left shoulder: Secondary | ICD-10-CM | POA: Diagnosis not present

## 2018-03-18 DIAGNOSIS — I69354 Hemiplegia and hemiparesis following cerebral infarction affecting left non-dominant side: Secondary | ICD-10-CM | POA: Diagnosis not present

## 2018-03-18 DIAGNOSIS — Z7902 Long term (current) use of antithrombotics/antiplatelets: Secondary | ICD-10-CM | POA: Diagnosis not present

## 2018-03-18 DIAGNOSIS — I110 Hypertensive heart disease with heart failure: Secondary | ICD-10-CM | POA: Diagnosis not present

## 2018-03-18 DIAGNOSIS — I5032 Chronic diastolic (congestive) heart failure: Secondary | ICD-10-CM | POA: Diagnosis not present

## 2018-03-18 NOTE — Telephone Encounter (Signed)
Sharyn Lull @ [facility] [phone] needs verbal orders. She is from OT at [facility].  See previous note for plan.

## 2018-03-18 NOTE — Telephone Encounter (Signed)
Ok to give verbal order.

## 2018-03-18 NOTE — Telephone Encounter (Signed)
Sharyn Lull aware of the requested verbal order for Mr. Stehlin. Verbalized understanding.

## 2018-03-21 ENCOUNTER — Telehealth: Payer: Self-pay | Admitting: Family Medicine

## 2018-03-21 DIAGNOSIS — E1169 Type 2 diabetes mellitus with other specified complication: Secondary | ICD-10-CM | POA: Diagnosis not present

## 2018-03-21 DIAGNOSIS — J449 Chronic obstructive pulmonary disease, unspecified: Secondary | ICD-10-CM | POA: Diagnosis not present

## 2018-03-21 DIAGNOSIS — I480 Paroxysmal atrial fibrillation: Secondary | ICD-10-CM | POA: Diagnosis not present

## 2018-03-21 DIAGNOSIS — I2510 Atherosclerotic heart disease of native coronary artery without angina pectoris: Secondary | ICD-10-CM | POA: Diagnosis not present

## 2018-03-21 DIAGNOSIS — G8929 Other chronic pain: Secondary | ICD-10-CM | POA: Diagnosis not present

## 2018-03-21 DIAGNOSIS — E1151 Type 2 diabetes mellitus with diabetic peripheral angiopathy without gangrene: Secondary | ICD-10-CM | POA: Diagnosis not present

## 2018-03-21 DIAGNOSIS — I110 Hypertensive heart disease with heart failure: Secondary | ICD-10-CM | POA: Diagnosis not present

## 2018-03-21 DIAGNOSIS — Z720 Tobacco use: Secondary | ICD-10-CM | POA: Diagnosis not present

## 2018-03-21 DIAGNOSIS — Z9119 Patient's noncompliance with other medical treatment and regimen: Secondary | ICD-10-CM | POA: Diagnosis not present

## 2018-03-21 DIAGNOSIS — I69354 Hemiplegia and hemiparesis following cerebral infarction affecting left non-dominant side: Secondary | ICD-10-CM | POA: Diagnosis not present

## 2018-03-21 DIAGNOSIS — M19012 Primary osteoarthritis, left shoulder: Secondary | ICD-10-CM | POA: Diagnosis not present

## 2018-03-21 DIAGNOSIS — I5032 Chronic diastolic (congestive) heart failure: Secondary | ICD-10-CM | POA: Diagnosis not present

## 2018-03-21 DIAGNOSIS — M109 Gout, unspecified: Secondary | ICD-10-CM | POA: Diagnosis not present

## 2018-03-21 DIAGNOSIS — Z7902 Long term (current) use of antithrombotics/antiplatelets: Secondary | ICD-10-CM | POA: Diagnosis not present

## 2018-03-21 NOTE — Telephone Encounter (Signed)
Sharyn Lull with [facility] called to inform of message. Patient is complaining of left side abdominal pain. Sharyn Lull says there is a small bump. Patient has a rash on torso and arm.    Sharyn Lull would like verbal orders for home health social worke.  Please follow up.

## 2018-03-21 NOTE — Telephone Encounter (Signed)
Verbal orders were given to [facility]. Will route to PCP for rash and abdominal pain.

## 2018-03-21 NOTE — Telephone Encounter (Signed)
Needs an office visit for the additional concerns

## 2018-03-21 NOTE — Telephone Encounter (Signed)
lyndsey called for the verbal OK for the social worker. Says patient has intense itching reports that she did not see any rashes only claw marks from where he was scratching.Tobin Chad says she is recommending vistaril for itching and anxiety.  Please follow up.

## 2018-03-22 ENCOUNTER — Ambulatory Visit: Payer: Medicare Other | Admitting: Adult Health

## 2018-03-22 ENCOUNTER — Telehealth: Payer: Self-pay

## 2018-03-22 NOTE — Progress Notes (Deleted)
[facility] [address] [place]. [place] [address] [phone]       OFFICE FOLLOW UP NOTE  [name] Date of Birth:  Apr 15, 1950 Medical Record Number:  [id number]   Reason for Referral:  hospital stroke follow up  CHIEF COMPLAINT:  No chief complaint on file.   HPI: [name] is being seen today for initial visit in the office for right MCA small embolic infarct due to chronic right ICA occlusion in the setting of cocaine abuse, continued tobacco use and medication noncompliance on 01/12/2018. History obtained from *** and chart review. Reviewed all radiology images and labs personally.  [name] is a 68 y.o.malewith past medical history of hyperlipidemia, diabetes mellitus, coronary artery disease, alcohol abuse, congestive heart failure, COPD, extensive stroke history with left-sided weakness (stroke summary listed below), and chronically occluded right ICAwho presented to the emergency department for 2 to 3-day history of worsening left-sided weakness and left facial numbness. MRI brain reviewed and showed cortical infarcts in the right MCA territory concerning for stump emboli rather than watershed infarcts.  Cerebral angiogram showed chronic right ICA stent occlusion.  2D echo showed an EF of 55 to 60%.  UDS positive for cocaine along with tobacco use and medication noncompliance.  Recommended continuation of aspirin and Plavix.  HTN stable with BP goal 130-150 due to right ICA occlusion with avoidance of hypotension.  Continuation of atorvastatin for HTN management.  Current tobacco use with smoking cessation counseling provided.  UDS positive for cocaine with cessation education again provided.  Other stroke risk factors include EtOH use, obesity and history of stroke.  He did have episode of unresponsiveness in 11/2017 and Keppra initiated for seizure prophylaxis.  He was discharged to [facility] for ongoing therapies in stable condition.  He  eventually was discharged to SNF on 02/04/2018.     History of stroke:  August 01, 2014 right frontal lobe watershed infarcts with hypotension in setting of R paraclinoid ICA stenosis   August 19, 2014 right posterior frontal infarct secondary to hypotension in setting ofsevere R paraclinoid stenosis  August 26, 2014 right corpus callosum infarcts in setting of R frontal infarcts in June, and new right frontal at early July, felt to be watershed in setting of hypotension due to R ICA supraclinoid near occlusion, though could not rule out embolic source.  02/2015, admitted for left-sided weakness, MRI showed scattered right MCA, MCA/ACA watershed infarcts, again felt to be secondary to right ICA supraclinoid near occlusion.  IR placed right supraclinoid ICA stent and proximal ICA stent.  Continue on aspirin 325 and Plavix 75 and Lipitor 80.  Patient discharged to [facility].  07/2016, patient presented with again right MCA patchy multifocal infarcts, MRA showed right supraclinoid ICA stent near occlusion with distal reconstitution.  However, CTA head and neck showed both right supraclinoid ICA and proximal ICA stent were patent but with taper of distal proximal ICA stent and suspected to be hemodynamically significant. Carotid Doppler showed right ICA proximal stent patent.  DSA showed 50% in-stent stenosis right ICA cavernous segment, and only 20% stenosis distal portion of proximal right ICA stent.  LDL 95 and A1c 5.8.  He was continued on aspirin and Plavix.  10/2016 follow with Dr. Leonie [facility], discontinue aspirin, continue on Plavix alone.  10/2017, admitted again for right MCA watershed infarct.  CTA head neck showed occluded right proximal ICA stent, with grossly patent flow within the right cavernous ICA stent.  LDL 97 A1c  5.9.  Patient was discharged on aspirin 325 and Plavix 75.  11/2017, patient admitted for unresponsive episode.  EEG negative.  MRI negative for acute stroke.  He was put on  Keppra 500 twice daily on discharge.  ROS:   14 system review of systems performed and negative with exception of ***  PMH:  Past Medical History:  Diagnosis Date  . Alcohol abuse    H/O  . Anxiety   . Arthritis   . Asthma   . CAD (coronary artery disease) 05/27/10   Cath: severe single vessell CAD left cx midportion obtuse marginal 2 to 3.  [name] CHF (congestive heart failure) ([place])   . COPD (chronic obstructive pulmonary disease) ([place])   . COPD (chronic obstructive pulmonary disease) ([place])   . Depression   . Diabetes mellitus, type 2 ([place]) 03/13/2015  . GERD (gastroesophageal reflux disease)   . HCAP (healthcare-associated pneumonia) 10/15/2015  . History of DVT of lower extremity   . Hyperlipemia   . Hyperlipidemia   . Hypertension   . Myocardial infarction ([place]) 2000  . Orbital fracture 12/2012  . OSA on CPAP   . Peripheral vascular disease, unspecified ([place])    08/20/10 doppler: increase in right ABI post-op. Left ABI stable. S/P bi-fem bypass surgery  . Pneumonia 04/04/2012  . Shortness of breath   . Stroke ([place])   . Tobacco abuse     PSH:  Past Surgical History:  Procedure Laterality Date  . abdoninal ao angio & bifem angio  05/27/10   Patent graft, occluded bil stents with no retrograde flow into the hypogastric arteries. 100% occl left ant. tibial artery, 70% to 80% to 100% stenosis right superficial fem artery above adductor canal. 100 % occl right ant. tibial vessell  . Aortogram w/ PTCA  09/292003  . BACK SURGERY    . CARDIAC CATHETERIZATION  05/27/10   severe CAD left cx  . CHOLECYSTECTOMY    . ESOPHAGOGASTRIC FUNDOPLICATION    . EYE SURGERY    . FEMORAL BYPASS  08/19/10   Right Fem-Pop  . IR ANGIO INTRA EXTRACRAN SEL COM CAROTID INNOMINATE BILAT MOD SED  07/20/2016  . IR ANGIO INTRA EXTRACRAN SEL COM CAROTID INNOMINATE BILAT MOD SED  01/14/2018  . IR ANGIO VERTEBRAL SEL SUBCLAVIAN INNOMINATE UNI R MOD SED  07/20/2016  . IR ANGIO VERTEBRAL SEL VERTEBRAL BILAT MOD  SED  01/14/2018  . IR ANGIO VERTEBRAL SEL VERTEBRAL UNI L MOD SED  07/20/2016  . IR RADIOLOGIST EVAL & MGMT  05/27/2016  . IR US GUIDE VASC ACCESS RIGHT  01/14/2018  . JOINT REPLACEMENT    . PERIPHERAL VASCULAR CATHETERIZATION N/A 12/10/2014   Procedure: Abdominal Aortogram;  Surgeon: Angelia Mould, MD;  Location: [place] [facility];  Service: Cardiovascular;  Laterality: N/A;  . RADIOLOGY WITH ANESTHESIA N/A 02/08/2015   Procedure: RADIOLOGY WITH ANESTHESIA;  Surgeon: Luanne Bras, MD;  Location: [facility];  Service: Radiology;  Laterality: N/A;  . TEE WITHOUT CARDIOVERSION N/A 08/29/2014   Procedure: TRANSESOPHAGEAL ECHOCARDIOGRAM (TEE);  Surgeon: Josue Hector, MD;  [facility] END[facility];  Service: Cardiovascular;  Laterality: N/A;  . TOTAL KNEE ARTHROPLASTY      Social History:  Social History   Socioeconomic History  . Marital status: Divorced    Spouse name: Not on file  . Number of children: Not on file  . Years of education: Not on file  . Highest education level: Not on file  Occupational History  . Not on file  Social Needs  . Financial resource strain: Not on file  . Food insecurity:    Worry: Not on file    Inability: Not on file  . Transportation needs:    Medical: Not on file    Non-medical: Not on file  Tobacco Use  . Smoking status: Current Every Day Smoker    Packs/day: 0.50    Years: 50.00    Pack years: 25.00    Types: Cigars, Cigarettes    Start date: 02/09/1961  . Smokeless tobacco: Former Systems developer    Types: Chew  . Tobacco comment: 2 ppd, full flavor  Substance and Sexual Activity  . Alcohol use: Yes  . Drug use: Yes    Types: Cocaine  . Sexual activity: Yes  Lifestyle  . Physical activity:    Days per week: Not on file    Minutes per session: Not on file  . Stress: Not on file  Relationships  . Social connections:    Talks on phone: Not on file    Gets together: Not on file    Attends religious service: Not on file    Active member of  club or organization: Not on file    Attends meetings of clubs or organizations: Not on file    Relationship status: Not on file  . Intimate partner violence:    Fear of current or ex partner: Not on file    Emotionally abused: Not on file    Physically abused: Not on file    Forced sexual activity: Not on file  Other Topics Concern  . Not on file  Social History Narrative   Recovering alcoholic    Family History:  Family History  Problem Relation Age of Onset  . Heart attack Mother   . Heart failure Mother   . Cirrhosis Father   . Heart failure Brother   . Cancer Brother   . Hypertension Neg Hx        UNKNOWN  . Stroke Neg Hx        UNKNOWN    Medications:   Current Outpatient Medications on File Prior to Visit  Medication Sig Dispense Refill  . acetaminophen (TYLENOL) 325 MG tablet Take 2 tablets (650 mg total) by mouth every 4 (four) hours as needed for mild pain (or temp > 37.5 C (99.5 F)).    [name] allopurinol (ZYLOPRIM) 300 MG tablet Take 1 tablet (300 mg total) by mouth daily. 30 tablet 3  . aspirin 325 MG tablet Take 1 tablet (325 mg total) by mouth daily. (Patient taking differently: Take 81 mg by mouth daily. )    . atorvastatin (LIPITOR) 80 MG tablet Take 1 tablet (80 mg total) by mouth daily at 6 PM. 30 tablet 3  . baclofen (LIORESAL) 10 MG tablet Take 1 tablet (10 mg total) by mouth at bedtime. 30 each 0  . carvedilol (COREG) 3.125 MG tablet Take 1 tablet (3.125 mg total) by mouth 2 (two) times daily with a meal.    . clopidogrel (PLAVIX) 75 MG tablet Take 1 tablet (75 mg total) by mouth daily. 30 tablet 3  . DULoxetine (CYMBALTA) 60 MG capsule Take 1 capsule (60 mg total) by mouth daily. 30 capsule 3  . folic acid (FOLVITE) 1 MG tablet Take 1 tablet (1 mg total) by mouth daily.    . hydrOXYzine (ATARAX/VISTARIL) 25 MG tablet Take 1 tablet (25 mg total) by mouth every 8 (eight) hours as needed for itching. 30 tablet 0  .  levETIRAcetam (KEPPRA) 500 MG tablet Take 1  tablet (500 mg total) by mouth 2 (two) times daily. 60 tablet 3  . memantine (NAMENDA) 10 MG tablet Take 1 tablet (10 mg total) by mouth 2 (two) times daily. 6 tablet 0  . mometasone-formoterol (DULERA) 200-5 MCG/ACT AERO Inhale 2 puffs into the lungs 2 (two) times daily.    . nicotine (NICODERM CQ - DOSED IN MG/24 HOURS) 21 mg/24hr patch Place 1 patch (21 mg total) onto the skin daily. 28 patch 0  . pantoprazole (PROTONIX) 40 MG tablet Take 1 tablet (40 mg total) by mouth daily. 30 tablet 3  . pregabalin (LYRICA) 150 MG capsule Take 1 capsule (150 mg total) by mouth 2 (two) times daily. 180 capsule 0   No current facility-administered medications on file prior to visit.     Allergies:   Allergies  Allergen Reactions  . Darvocet [Propoxyphene N-Acetaminophen] Hives  . Haldol [Haloperidol Decanoate] Hives  . Acetaminophen Nausea Only    Upset stomach, tolerates Hydrocodone/APAP if taken with food  . Metformin And Related Nausea And Vomiting  . Norco [Hydrocodone-Acetaminophen] Nausea And Vomiting    Tolerates if taken with food     Physical Exam  There were no vitals filed for this visit. There is no height or weight on file to calculate BMI. No exam data present  General: well developed, well nourished, seated, in no evident distress Head: head normocephalic and atraumatic.   Neck: supple with no carotid or supraclavicular bruits Cardiovascular: regular rate and rhythm, no murmurs Musculoskeletal: no deformity Skin:  no rash/petichiae Vascular:  Normal pulses all extremities  Neurologic Exam Mental Status: Awake and fully alert. Oriented to place and time. Recent and remote memory intact. Attention span, concentration and fund of knowledge appropriate. Mood and affect appropriate.  Cranial Nerves: Fundoscopic exam reveals sharp disc margins. Pupils equal, briskly reactive to light. Extraocular movements full without nystagmus. Visual fields full to confrontation. Hearing  intact. Facial sensation intact. Face, tongue, palate moves normally and symmetrically.  Motor: Normal bulk and tone. Normal strength in all tested extremity muscles. Sensory.: intact to touch , pinprick , position and vibratory sensation.  Coordination: Rapid alternating movements normal in all extremities. Finger-to-nose and heel-to-shin performed accurately bilaterally. Gait and Station: Arises from chair without difficulty. Stance is normal. Gait demonstrates normal stride length and balance. Able to heel, toe and tandem walk without difficulty.  Reflexes: 1+ and symmetric. Toes downgoing.    NIHSS  *** Modified Rankin  ***    Diagnostic Data (Labs, Imaging, Testing)  CT HEAD WO CONTRAST 01/12/2018 IMPRESSION: Old right frontal/parietal infarcts. No acute intracranial abnormality. Atrophy, chronic microvascular disease.  IR CEREBRAL ANGIOGRAM 01/14/2018 IMPRESSION: Angiographically complete occlusion of the right internal carotid artery within the previously positioned stent without evidence of a delayed string sign. Partial reconstitution of the right internal carotid artery supraclinoid segment from the ophthalmic artery via the nasolacrimal collaterals. Retrograde opacification of the right internal carotid artery supraclinoid segment from the posterior circulation via the posterior communicating artery with subsequently into the right middle cerebral artery. Partially collateralization of the right MCA distribution and the right anterior cerebral artery distribution via the anterior communicating artery from the left internal carotid artery. Angiographically occluded left external carotid artery at its origin, with reconstitution via the left occipital artery from the left vertebral artery.  MR BRAIN WO CONTRAST 01/13/2018 IMPRESSION: 1. Mild motion degraded examination. Multifocal acute small RIGHT frontal/MCA territory infarcts. 2. Old RIGHT MCA territory  infarct.  Chronically occluded RIGHT ICA. 3. Similar parenchymal brain volume loss and mild-to-moderate chronic small vessel ischemic changes. 4. Stable chronic microhemorrhages and RIGHT superficial siderosis.    ASSESSMENT: [name] is a 68 y.o. year old male here with right MCA embolic infarcts due to chronic right ICA occlusion in the setting of cocaine abuse, continued tobacco use and medication noncompliance on 01/12/2018. Vascular risk factors include multiple strokes, HLD, HTN, DM, CAD, CHF, COPD, chronically occluded right ICA, cocaine use, tobacco use and alcohol use with medication noncompliance.     PLAN:  1. Right MCA embolic infarct: Continue aspirin 81 mg daily and clopidogrel 75 mg daily  and atorvastatin for secondary stroke prevention. Maintain strict control of hypertension with blood pressure goal below 130/90, diabetes with hemoglobin A1c goal below 6.5% and cholesterol with LDL cholesterol (bad cholesterol) goal below 70 mg/dL.  I also advised the patient to eat a healthy diet with plenty of whole grains, cereals, fruits and vegetables, exercise regularly with at least 30 minutes of continuous activity daily and maintain ideal body weight. 2. HTN: Advised to continue current treatment regimen.  Today's BP ***.  Advised to continue to monitor at home along with continued follow-up with PCP for management 3. HLD: Advised to continue current treatment regimen along with continued follow-up with PCP for future prescribing and monitoring of lipid panel 4. DMII: Advised to continue to monitor glucose levels at home along with continued follow-up with PCP for management and monitoring 5. Illicit drug/tobacco/alcohol use: 6. Seizures:    Follow up in *** or call earlier if needed   Greater than 50% of time during this 25 minute visit was spent on counseling, explanation of diagnosis of right MCA embolic infarct, reviewing risk factor management of HTN, HLD, DM,  substance abuse and seizures, planning of further management along with potential future management, and discussion with patient and family answering all questions.    Venancio Poisson, AGNP-BC  [facility] [address], [place] [address]  Phone [phone] Fax [phone] Note: This document was prepared with digital dictation and possible smart phrase technology. Any transcriptional errors that result from this process are unintentional.

## 2018-03-22 NOTE — Telephone Encounter (Signed)
[name] was called and given verbal orders for social work. [name] was also informed of medication being sent to pharmacy for itching.

## 2018-03-22 NOTE — Telephone Encounter (Signed)
Patient no show for appt today. 

## 2018-03-23 ENCOUNTER — Encounter: Payer: Self-pay | Admitting: Adult Health

## 2018-03-23 DIAGNOSIS — M109 Gout, unspecified: Secondary | ICD-10-CM | POA: Diagnosis not present

## 2018-03-23 DIAGNOSIS — Z720 Tobacco use: Secondary | ICD-10-CM | POA: Diagnosis not present

## 2018-03-23 DIAGNOSIS — E1169 Type 2 diabetes mellitus with other specified complication: Secondary | ICD-10-CM | POA: Diagnosis not present

## 2018-03-23 DIAGNOSIS — Z7902 Long term (current) use of antithrombotics/antiplatelets: Secondary | ICD-10-CM | POA: Diagnosis not present

## 2018-03-23 DIAGNOSIS — M19012 Primary osteoarthritis, left shoulder: Secondary | ICD-10-CM | POA: Diagnosis not present

## 2018-03-23 DIAGNOSIS — I2510 Atherosclerotic heart disease of native coronary artery without angina pectoris: Secondary | ICD-10-CM | POA: Diagnosis not present

## 2018-03-23 DIAGNOSIS — I480 Paroxysmal atrial fibrillation: Secondary | ICD-10-CM | POA: Diagnosis not present

## 2018-03-23 DIAGNOSIS — J449 Chronic obstructive pulmonary disease, unspecified: Secondary | ICD-10-CM | POA: Diagnosis not present

## 2018-03-23 DIAGNOSIS — I69354 Hemiplegia and hemiparesis following cerebral infarction affecting left non-dominant side: Secondary | ICD-10-CM | POA: Diagnosis not present

## 2018-03-23 DIAGNOSIS — G8929 Other chronic pain: Secondary | ICD-10-CM | POA: Diagnosis not present

## 2018-03-23 DIAGNOSIS — I110 Hypertensive heart disease with heart failure: Secondary | ICD-10-CM | POA: Diagnosis not present

## 2018-03-23 DIAGNOSIS — Z9119 Patient's noncompliance with other medical treatment and regimen: Secondary | ICD-10-CM | POA: Diagnosis not present

## 2018-03-23 DIAGNOSIS — I5032 Chronic diastolic (congestive) heart failure: Secondary | ICD-10-CM | POA: Diagnosis not present

## 2018-03-23 DIAGNOSIS — E1151 Type 2 diabetes mellitus with diabetic peripheral angiopathy without gangrene: Secondary | ICD-10-CM | POA: Diagnosis not present

## 2018-03-24 DIAGNOSIS — I5032 Chronic diastolic (congestive) heart failure: Secondary | ICD-10-CM | POA: Diagnosis not present

## 2018-03-24 DIAGNOSIS — J449 Chronic obstructive pulmonary disease, unspecified: Secondary | ICD-10-CM | POA: Diagnosis not present

## 2018-03-24 DIAGNOSIS — M19012 Primary osteoarthritis, left shoulder: Secondary | ICD-10-CM | POA: Diagnosis not present

## 2018-03-24 DIAGNOSIS — E1151 Type 2 diabetes mellitus with diabetic peripheral angiopathy without gangrene: Secondary | ICD-10-CM | POA: Diagnosis not present

## 2018-03-24 DIAGNOSIS — I69354 Hemiplegia and hemiparesis following cerebral infarction affecting left non-dominant side: Secondary | ICD-10-CM | POA: Diagnosis not present

## 2018-03-24 DIAGNOSIS — Z720 Tobacco use: Secondary | ICD-10-CM | POA: Diagnosis not present

## 2018-03-24 DIAGNOSIS — E1169 Type 2 diabetes mellitus with other specified complication: Secondary | ICD-10-CM | POA: Diagnosis not present

## 2018-03-24 DIAGNOSIS — I480 Paroxysmal atrial fibrillation: Secondary | ICD-10-CM | POA: Diagnosis not present

## 2018-03-24 DIAGNOSIS — G8929 Other chronic pain: Secondary | ICD-10-CM | POA: Diagnosis not present

## 2018-03-24 DIAGNOSIS — M109 Gout, unspecified: Secondary | ICD-10-CM | POA: Diagnosis not present

## 2018-03-24 DIAGNOSIS — Z7902 Long term (current) use of antithrombotics/antiplatelets: Secondary | ICD-10-CM | POA: Diagnosis not present

## 2018-03-24 DIAGNOSIS — I2510 Atherosclerotic heart disease of native coronary artery without angina pectoris: Secondary | ICD-10-CM | POA: Diagnosis not present

## 2018-03-24 DIAGNOSIS — Z9119 Patient's noncompliance with other medical treatment and regimen: Secondary | ICD-10-CM | POA: Diagnosis not present

## 2018-03-24 DIAGNOSIS — I110 Hypertensive heart disease with heart failure: Secondary | ICD-10-CM | POA: Diagnosis not present

## 2018-03-25 ENCOUNTER — Emergency Department (HOSPITAL_COMMUNITY): Payer: Medicare Other

## 2018-03-25 ENCOUNTER — Encounter (HOSPITAL_COMMUNITY): Payer: Self-pay

## 2018-03-25 ENCOUNTER — Inpatient Hospital Stay (HOSPITAL_COMMUNITY)
Admission: EM | Admit: 2018-03-25 | Discharge: 2018-04-01 | DRG: 065 | Disposition: A | Discharge status: Skilled Nursing Facility | Payer: Medicare Other | Attending: Family Medicine | Admitting: Family Medicine

## 2018-03-25 ENCOUNTER — Inpatient Hospital Stay (HOSPITAL_COMMUNITY): Payer: Medicare Other

## 2018-03-25 DIAGNOSIS — J9811 Atelectasis: Secondary | ICD-10-CM | POA: Diagnosis not present

## 2018-03-25 DIAGNOSIS — R52 Pain, unspecified: Secondary | ICD-10-CM | POA: Diagnosis not present

## 2018-03-25 DIAGNOSIS — R2681 Unsteadiness on feet: Secondary | ICD-10-CM | POA: Diagnosis not present

## 2018-03-25 DIAGNOSIS — I2510 Atherosclerotic heart disease of native coronary artery without angina pectoris: Secondary | ICD-10-CM | POA: Diagnosis present

## 2018-03-25 DIAGNOSIS — I69354 Hemiplegia and hemiparesis following cerebral infarction affecting left non-dominant side: Secondary | ICD-10-CM

## 2018-03-25 DIAGNOSIS — I771 Stricture of artery: Secondary | ICD-10-CM | POA: Diagnosis not present

## 2018-03-25 DIAGNOSIS — Z96651 Presence of right artificial knee joint: Secondary | ICD-10-CM | POA: Diagnosis present

## 2018-03-25 DIAGNOSIS — I639 Cerebral infarction, unspecified: Secondary | ICD-10-CM | POA: Diagnosis not present

## 2018-03-25 DIAGNOSIS — R911 Solitary pulmonary nodule: Secondary | ICD-10-CM | POA: Diagnosis not present

## 2018-03-25 DIAGNOSIS — E669 Obesity, unspecified: Secondary | ICD-10-CM | POA: Diagnosis present

## 2018-03-25 DIAGNOSIS — I6521 Occlusion and stenosis of right carotid artery: Secondary | ICD-10-CM | POA: Diagnosis not present

## 2018-03-25 DIAGNOSIS — M6281 Muscle weakness (generalized): Secondary | ICD-10-CM | POA: Diagnosis not present

## 2018-03-25 DIAGNOSIS — R29810 Facial weakness: Secondary | ICD-10-CM | POA: Diagnosis not present

## 2018-03-25 DIAGNOSIS — R471 Dysarthria and anarthria: Secondary | ICD-10-CM | POA: Diagnosis not present

## 2018-03-25 DIAGNOSIS — J039 Acute tonsillitis, unspecified: Secondary | ICD-10-CM | POA: Diagnosis not present

## 2018-03-25 DIAGNOSIS — G459 Transient cerebral ischemic attack, unspecified: Secondary | ICD-10-CM | POA: Diagnosis not present

## 2018-03-25 DIAGNOSIS — R1012 Left upper quadrant pain: Secondary | ICD-10-CM

## 2018-03-25 DIAGNOSIS — E1142 Type 2 diabetes mellitus with diabetic polyneuropathy: Secondary | ICD-10-CM | POA: Diagnosis not present

## 2018-03-25 DIAGNOSIS — Z885 Allergy status to narcotic agent status: Secondary | ICD-10-CM

## 2018-03-25 DIAGNOSIS — E1151 Type 2 diabetes mellitus with diabetic peripheral angiopathy without gangrene: Secondary | ICD-10-CM | POA: Diagnosis not present

## 2018-03-25 DIAGNOSIS — M1000 Idiopathic gout, unspecified site: Secondary | ICD-10-CM

## 2018-03-25 DIAGNOSIS — G8929 Other chronic pain: Secondary | ICD-10-CM | POA: Diagnosis not present

## 2018-03-25 DIAGNOSIS — Z6831 Body mass index (BMI) 31.0-31.9, adult: Secondary | ICD-10-CM

## 2018-03-25 DIAGNOSIS — Z6830 Body mass index (BMI) 30.0-30.9, adult: Secondary | ICD-10-CM

## 2018-03-25 DIAGNOSIS — G40909 Epilepsy, unspecified, not intractable, without status epilepticus: Secondary | ICD-10-CM | POA: Diagnosis present

## 2018-03-25 DIAGNOSIS — Z79899 Other long term (current) drug therapy: Secondary | ICD-10-CM

## 2018-03-25 DIAGNOSIS — J449 Chronic obstructive pulmonary disease, unspecified: Secondary | ICD-10-CM | POA: Diagnosis not present

## 2018-03-25 DIAGNOSIS — M19012 Primary osteoarthritis, left shoulder: Secondary | ICD-10-CM | POA: Diagnosis not present

## 2018-03-25 DIAGNOSIS — T827XXA Infection and inflammatory reaction due to other cardiac and vascular devices, implants and grafts, initial encounter: Secondary | ICD-10-CM | POA: Diagnosis not present

## 2018-03-25 DIAGNOSIS — E7800 Pure hypercholesterolemia, unspecified: Secondary | ICD-10-CM | POA: Diagnosis not present

## 2018-03-25 DIAGNOSIS — Z8639 Personal history of other endocrine, nutritional and metabolic disease: Secondary | ICD-10-CM

## 2018-03-25 DIAGNOSIS — F17221 Nicotine dependence, chewing tobacco, in remission: Secondary | ICD-10-CM | POA: Diagnosis present

## 2018-03-25 DIAGNOSIS — Z8673 Personal history of transient ischemic attack (TIA), and cerebral infarction without residual deficits: Secondary | ICD-10-CM

## 2018-03-25 DIAGNOSIS — F1910 Other psychoactive substance abuse, uncomplicated: Secondary | ICD-10-CM | POA: Diagnosis present

## 2018-03-25 DIAGNOSIS — Z7982 Long term (current) use of aspirin: Secondary | ICD-10-CM

## 2018-03-25 DIAGNOSIS — G2581 Restless legs syndrome: Secondary | ICD-10-CM | POA: Diagnosis not present

## 2018-03-25 DIAGNOSIS — Z720 Tobacco use: Secondary | ICD-10-CM | POA: Diagnosis not present

## 2018-03-25 DIAGNOSIS — I5032 Chronic diastolic (congestive) heart failure: Secondary | ICD-10-CM | POA: Diagnosis not present

## 2018-03-25 DIAGNOSIS — I69392 Facial weakness following cerebral infarction: Secondary | ICD-10-CM | POA: Diagnosis not present

## 2018-03-25 DIAGNOSIS — L03113 Cellulitis of right upper limb: Secondary | ICD-10-CM | POA: Diagnosis not present

## 2018-03-25 DIAGNOSIS — Z8719 Personal history of other diseases of the digestive system: Secondary | ICD-10-CM

## 2018-03-25 DIAGNOSIS — I10 Essential (primary) hypertension: Secondary | ICD-10-CM | POA: Diagnosis present

## 2018-03-25 DIAGNOSIS — F1010 Alcohol abuse, uncomplicated: Secondary | ICD-10-CM | POA: Diagnosis present

## 2018-03-25 DIAGNOSIS — I714 Abdominal aortic aneurysm, without rupture: Secondary | ICD-10-CM | POA: Diagnosis present

## 2018-03-25 DIAGNOSIS — G8194 Hemiplegia, unspecified affecting left nondominant side: Secondary | ICD-10-CM | POA: Diagnosis present

## 2018-03-25 DIAGNOSIS — M2550 Pain in unspecified joint: Secondary | ICD-10-CM | POA: Diagnosis not present

## 2018-03-25 DIAGNOSIS — F0150 Vascular dementia without behavioral disturbance: Secondary | ICD-10-CM | POA: Diagnosis present

## 2018-03-25 DIAGNOSIS — Z8249 Family history of ischemic heart disease and other diseases of the circulatory system: Secondary | ICD-10-CM

## 2018-03-25 DIAGNOSIS — R918 Other nonspecific abnormal finding of lung field: Secondary | ICD-10-CM | POA: Diagnosis not present

## 2018-03-25 DIAGNOSIS — F0280 Dementia in other diseases classified elsewhere without behavioral disturbance: Secondary | ICD-10-CM | POA: Diagnosis not present

## 2018-03-25 DIAGNOSIS — T8029XA Infection following other infusion, transfusion and therapeutic injection, initial encounter: Secondary | ICD-10-CM | POA: Diagnosis not present

## 2018-03-25 DIAGNOSIS — F329 Major depressive disorder, single episode, unspecified: Secondary | ICD-10-CM | POA: Diagnosis present

## 2018-03-25 DIAGNOSIS — I110 Hypertensive heart disease with heart failure: Secondary | ICD-10-CM | POA: Diagnosis present

## 2018-03-25 DIAGNOSIS — F17290 Nicotine dependence, other tobacco product, uncomplicated: Secondary | ICD-10-CM | POA: Diagnosis present

## 2018-03-25 DIAGNOSIS — R29898 Other symptoms and signs involving the musculoskeletal system: Secondary | ICD-10-CM | POA: Diagnosis not present

## 2018-03-25 DIAGNOSIS — Z9119 Patient's noncompliance with other medical treatment and regimen: Secondary | ICD-10-CM | POA: Diagnosis not present

## 2018-03-25 DIAGNOSIS — E119 Type 2 diabetes mellitus without complications: Secondary | ICD-10-CM | POA: Diagnosis present

## 2018-03-25 DIAGNOSIS — I618 Other nontraumatic intracerebral hemorrhage: Secondary | ICD-10-CM | POA: Diagnosis not present

## 2018-03-25 DIAGNOSIS — I959 Hypotension, unspecified: Secondary | ICD-10-CM | POA: Diagnosis not present

## 2018-03-25 DIAGNOSIS — I252 Old myocardial infarction: Secondary | ICD-10-CM | POA: Diagnosis not present

## 2018-03-25 DIAGNOSIS — Z7401 Bed confinement status: Secondary | ICD-10-CM | POA: Diagnosis not present

## 2018-03-25 DIAGNOSIS — E785 Hyperlipidemia, unspecified: Secondary | ICD-10-CM | POA: Diagnosis present

## 2018-03-25 DIAGNOSIS — F17200 Nicotine dependence, unspecified, uncomplicated: Secondary | ICD-10-CM | POA: Diagnosis present

## 2018-03-25 DIAGNOSIS — M109 Gout, unspecified: Secondary | ICD-10-CM | POA: Diagnosis not present

## 2018-03-25 DIAGNOSIS — F1020 Alcohol dependence, uncomplicated: Secondary | ICD-10-CM | POA: Diagnosis present

## 2018-03-25 DIAGNOSIS — R109 Unspecified abdominal pain: Secondary | ICD-10-CM

## 2018-03-25 DIAGNOSIS — I700 Atherosclerosis of aorta: Secondary | ICD-10-CM | POA: Diagnosis present

## 2018-03-25 DIAGNOSIS — F17210 Nicotine dependence, cigarettes, uncomplicated: Secondary | ICD-10-CM | POA: Diagnosis present

## 2018-03-25 DIAGNOSIS — I480 Paroxysmal atrial fibrillation: Secondary | ICD-10-CM | POA: Diagnosis present

## 2018-03-25 DIAGNOSIS — G479 Sleep disorder, unspecified: Secondary | ICD-10-CM | POA: Diagnosis not present

## 2018-03-25 DIAGNOSIS — E1169 Type 2 diabetes mellitus with other specified complication: Secondary | ICD-10-CM | POA: Diagnosis not present

## 2018-03-25 DIAGNOSIS — G4733 Obstructive sleep apnea (adult) (pediatric): Secondary | ICD-10-CM | POA: Diagnosis present

## 2018-03-25 DIAGNOSIS — I5033 Acute on chronic diastolic (congestive) heart failure: Secondary | ICD-10-CM | POA: Diagnosis not present

## 2018-03-25 DIAGNOSIS — Z95820 Peripheral vascular angioplasty status with implants and grafts: Secondary | ICD-10-CM

## 2018-03-25 DIAGNOSIS — F1410 Cocaine abuse, uncomplicated: Secondary | ICD-10-CM | POA: Diagnosis present

## 2018-03-25 DIAGNOSIS — Z7902 Long term (current) use of antithrombotics/antiplatelets: Secondary | ICD-10-CM

## 2018-03-25 DIAGNOSIS — R29714 NIHSS score 14: Secondary | ICD-10-CM | POA: Diagnosis not present

## 2018-03-25 DIAGNOSIS — F1490 Cocaine use, unspecified, uncomplicated: Secondary | ICD-10-CM | POA: Diagnosis present

## 2018-03-25 DIAGNOSIS — I63511 Cerebral infarction due to unspecified occlusion or stenosis of right middle cerebral artery: Secondary | ICD-10-CM | POA: Diagnosis not present

## 2018-03-25 DIAGNOSIS — Z7951 Long term (current) use of inhaled steroids: Secondary | ICD-10-CM

## 2018-03-25 DIAGNOSIS — Z66 Do not resuscitate: Secondary | ICD-10-CM | POA: Diagnosis present

## 2018-03-25 DIAGNOSIS — R29818 Other symptoms and signs involving the nervous system: Secondary | ICD-10-CM | POA: Diagnosis not present

## 2018-03-25 DIAGNOSIS — G9340 Encephalopathy, unspecified: Secondary | ICD-10-CM | POA: Diagnosis not present

## 2018-03-25 DIAGNOSIS — K219 Gastro-esophageal reflux disease without esophagitis: Secondary | ICD-10-CM | POA: Diagnosis present

## 2018-03-25 DIAGNOSIS — D649 Anemia, unspecified: Secondary | ICD-10-CM | POA: Diagnosis not present

## 2018-03-25 DIAGNOSIS — Z888 Allergy status to other drugs, medicaments and biological substances status: Secondary | ICD-10-CM

## 2018-03-25 DIAGNOSIS — I6350 Cerebral infarction due to unspecified occlusion or stenosis of unspecified cerebral artery: Secondary | ICD-10-CM | POA: Diagnosis not present

## 2018-03-25 DIAGNOSIS — Z8679 Personal history of other diseases of the circulatory system: Secondary | ICD-10-CM

## 2018-03-25 DIAGNOSIS — I63521 Cerebral infarction due to unspecified occlusion or stenosis of right anterior cerebral artery: Principal | ICD-10-CM | POA: Diagnosis present

## 2018-03-25 DIAGNOSIS — R4781 Slurred speech: Secondary | ICD-10-CM | POA: Diagnosis not present

## 2018-03-25 DIAGNOSIS — E1149 Type 2 diabetes mellitus with other diabetic neurological complication: Secondary | ICD-10-CM

## 2018-03-25 DIAGNOSIS — E118 Type 2 diabetes mellitus with unspecified complications: Secondary | ICD-10-CM | POA: Diagnosis not present

## 2018-03-25 DIAGNOSIS — I6529 Occlusion and stenosis of unspecified carotid artery: Secondary | ICD-10-CM | POA: Diagnosis present

## 2018-03-25 DIAGNOSIS — Z86718 Personal history of other venous thrombosis and embolism: Secondary | ICD-10-CM

## 2018-03-25 DIAGNOSIS — F419 Anxiety disorder, unspecified: Secondary | ICD-10-CM | POA: Diagnosis present

## 2018-03-25 DIAGNOSIS — I6523 Occlusion and stenosis of bilateral carotid arteries: Secondary | ICD-10-CM | POA: Diagnosis not present

## 2018-03-25 DIAGNOSIS — N3941 Urge incontinence: Secondary | ICD-10-CM | POA: Diagnosis present

## 2018-03-25 HISTORY — DX: Type 2 diabetes mellitus with diabetic polyneuropathy: E11.42

## 2018-03-25 HISTORY — DX: Unspecified disorder of eyelid: H02.9

## 2018-03-25 HISTORY — DX: Ocular pain, left eye: H57.12

## 2018-03-25 HISTORY — DX: Thrombocytopenia, unspecified: D69.6

## 2018-03-25 HISTORY — DX: Encephalopathy, unspecified: G93.40

## 2018-03-25 HISTORY — DX: Diplopia: H53.2

## 2018-03-25 HISTORY — DX: Unspecified protein-calorie malnutrition: E46

## 2018-03-25 HISTORY — DX: Localized edema: R60.0

## 2018-03-25 HISTORY — DX: Type 2 diabetes mellitus with other specified complication: E11.69

## 2018-03-25 HISTORY — DX: Presence of unspecified artificial knee joint: Z96.659

## 2018-03-25 HISTORY — DX: Fracture of orbital floor, unspecified side, initial encounter for closed fracture: S02.30XA

## 2018-03-25 HISTORY — DX: Hypotension, unspecified: I95.9

## 2018-03-25 HISTORY — DX: Personal history of other venous thrombosis and embolism: Z86.718

## 2018-03-25 HISTORY — DX: Acute posthemorrhagic anemia: D62

## 2018-03-25 HISTORY — DX: Cerebral infarction due to unspecified occlusion or stenosis of right carotid arteries: I63.231

## 2018-03-25 HISTORY — DX: Alcohol dependence with withdrawal, unspecified: F10.239

## 2018-03-25 HISTORY — DX: Unspecified urinary incontinence: R32

## 2018-03-25 HISTORY — DX: Other specified symptoms and signs involving the circulatory and respiratory systems: R09.89

## 2018-03-25 HISTORY — DX: Acute kidney failure, unspecified: N17.9

## 2018-03-25 HISTORY — DX: Acute on chronic diastolic (congestive) heart failure: I50.33

## 2018-03-25 HISTORY — DX: Cerebral infarction due to unspecified occlusion or stenosis of right middle cerebral artery: I63.511

## 2018-03-25 HISTORY — DX: Headache: R51

## 2018-03-25 HISTORY — DX: Hyperkalemia: E87.5

## 2018-03-25 HISTORY — DX: Cerebral infarction, unspecified: I63.9

## 2018-03-25 HISTORY — DX: Fracture of medial orbital wall, unspecified side, initial encounter for closed fracture: S02.839A

## 2018-03-25 HISTORY — DX: Dementia in other diseases classified elsewhere without behavioral disturbance: F02.80

## 2018-03-25 HISTORY — DX: Unspecified convulsions: R56.9

## 2018-03-25 HISTORY — DX: Cerebral infarction due to thrombosis of unspecified cerebral artery: I63.30

## 2018-03-25 HISTORY — DX: Cerebral infarction due to embolism of right middle cerebral artery: I63.411

## 2018-03-25 HISTORY — DX: Pain in left shoulder: M25.512

## 2018-03-25 HISTORY — DX: Acute embolism and thrombosis of unspecified deep veins of unspecified lower extremity: I82.409

## 2018-03-25 HISTORY — DX: Obesity, unspecified: E66.9

## 2018-03-25 HISTORY — DX: Enophthalmos due to trauma or surgery, unspecified eye: H05.429

## 2018-03-25 HISTORY — DX: Other disorders of plasma-protein metabolism, not elsewhere classified: E88.09

## 2018-03-25 HISTORY — DX: Trichiasis without entropion unspecified eye, unspecified eyelid: H02.059

## 2018-03-25 HISTORY — DX: Other forms of dyspnea: R06.09

## 2018-03-25 LAB — CBC
HCT: 39.3 % (ref 39.0–52.0)
HCT: 36.5 % — ABNORMAL LOW (ref 39.0–52.0)
Hemoglobin: 11.3 g/dL — ABNORMAL LOW (ref 13.0–17.0)
Hemoglobin: 11 g/dL — ABNORMAL LOW (ref 13.0–17.0)
MCH: 24.9 pg — ABNORMAL LOW (ref 26.0–34.0)
MCH: 25.4 pg — ABNORMAL LOW (ref 26.0–34.0)
MCHC: 28.8 g/dL — ABNORMAL LOW (ref 30.0–36.0)
MCHC: 30.1 g/dL (ref 30.0–36.0)
MCV: 86.6 fL (ref 80.0–100.0)
MCV: 84.3 fL (ref 80.0–100.0)
NRBC: 0 % (ref 0.0–0.2)
Platelets: 223 10*3/uL (ref 150–400)
Platelets: 214 10*3/uL (ref 150–400)
RBC: 4.54 MIL/uL (ref 4.22–5.81)
RBC: 4.33 MIL/uL (ref 4.22–5.81)
RDW: 14.5 % (ref 11.5–15.5)
RDW: 14.6 % (ref 11.5–15.5)
WBC: 6.2 10*3/uL (ref 4.0–10.5)
WBC: 6.1 10*3/uL (ref 4.0–10.5)
nRBC: 0 % (ref 0.0–0.2)

## 2018-03-25 LAB — CREATININE, SERUM
CREATININE: 0.79 mg/dL (ref 0.61–1.24)
GFR calc Af Amer: >60 mL/min (ref >60)
GFR calc non Af Amer: >60 mL/min (ref >60)

## 2018-03-25 LAB — CBG MONITORING, ED: Glucose-Capillary: 139 mg/dL — ABNORMAL HIGH (ref 70–99)

## 2018-03-25 LAB — COMPREHENSIVE METABOLIC PANEL
ALBUMIN: 3.6 g/dL (ref 3.5–5.0)
ALT: 14 U/L (ref 0–44)
AST: 19 U/L (ref 15–41)
Alkaline Phosphatase: 66 U/L (ref 38–126)
Anion gap: 9 (ref 5–15)
BILIRUBIN TOTAL: 0.4 mg/dL (ref 0.3–1.2)
BUN: 11 mg/dL (ref 8–23)
CALCIUM: 9.7 mg/dL (ref 8.9–10.3)
CO2: 25 mmol/L (ref 22–32)
Chloride: 110 mmol/L (ref 98–111)
Creatinine, Ser: 0.92 mg/dL (ref 0.61–1.24)
GFR calc Af Amer: >60 mL/min (ref >60)
GFR calc non Af Amer: >60 mL/min (ref >60)
Glucose, Bld: 88 mg/dL (ref 70–99)
Potassium: 4.2 mmol/L (ref 3.5–5.1)
Sodium: 144 mmol/L (ref 135–145)
Total Protein: 6.7 g/dL (ref 6.5–8.1)

## 2018-03-25 LAB — DIFFERENTIAL
Abs Immature Granulocytes: 0.03 10*3/uL (ref 0.00–0.07)
Basophils Absolute: 0.1 10*3/uL (ref 0.0–0.1)
Basophils Relative: 1 %
Eosinophils Absolute: 0.3 10*3/uL (ref 0.0–0.5)
Eosinophils Relative: 5 %
Immature Granulocytes: 1 %
Lymphocytes Relative: 25 %
Lymphs Abs: 1.5 10*3/uL (ref 0.7–4.0)
Monocytes Absolute: 0.8 10*3/uL (ref 0.1–1.0)
Monocytes Relative: 14 %
Neutro Abs: 3.3 10*3/uL (ref 1.7–7.7)
Neutrophils Relative %: 54 %

## 2018-03-25 LAB — PROTIME-INR
INR: 1.12
PROTHROMBIN TIME: 14.3 s (ref 11.4–15.2)

## 2018-03-25 LAB — I-STAT TROPONIN, ED: Troponin i, poc: 0 ng/mL (ref 0.00–0.08)

## 2018-03-25 LAB — APTT: aPTT: 29 seconds (ref 24–36)

## 2018-03-25 LAB — I-STAT CREATININE, ED: Creatinine, Ser: 0.8 mg/dL (ref 0.61–1.24)

## 2018-03-25 LAB — ETHANOL

## 2018-03-25 MED ORDER — ACETAMINOPHEN 325 MG PO TABS: 650.0000 mg | ORAL_TABLET | ORAL | Status: DC | PRN

## 2018-03-25 MED ORDER — ACETAMINOPHEN 160 MG/5ML PO SOLN: 650.0000 mg | ORAL | Status: DC | PRN

## 2018-03-25 MED ORDER — VITAMIN B-1 100 MG PO TABS
100.0000 mg | ORAL_TABLET | Freq: Every day | ORAL | Status: DC
  Administered 2018-03-25 – 2018-04-01 (×8): 100 mg via ORAL
  Filled 2018-03-25 (×8): qty 1

## 2018-03-25 MED ORDER — STROKE: EARLY STAGES OF RECOVERY BOOK
Freq: Once | Status: AC
  Administered 2018-03-25: 20:00:00
  Filled 2018-03-25: qty 1

## 2018-03-25 MED ORDER — LORAZEPAM 2 MG/ML IJ SOLN: 1.0000 mg | Freq: Four times a day (QID) | INTRAMUSCULAR | Status: AC | PRN

## 2018-03-25 MED ORDER — MOMETASONE FURO-FORMOTEROL FUM 200-5 MCG/ACT IN AERO
2.0000 | INHALATION_SPRAY | Freq: Two times a day (BID) | RESPIRATORY_TRACT | Status: DC
  Filled 2018-03-25: qty 8.8

## 2018-03-25 MED ORDER — SODIUM CHLORIDE 0.9% FLUSH
3.0000 mL | Freq: Once | INTRAVENOUS | Status: DC
Start: 2018-03-25 — End: 2018-04-01

## 2018-03-25 MED ORDER — SODIUM CHLORIDE 0.9 % IV SOLN
INTRAVENOUS | Status: DC
  Administered 2018-03-25: 21:00:00 via INTRAVENOUS

## 2018-03-25 MED ORDER — MOMETASONE FURO-FORMOTEROL FUM 200-5 MCG/ACT IN AERO
2.0000 | INHALATION_SPRAY | Freq: Two times a day (BID) | RESPIRATORY_TRACT | Status: DC
  Administered 2018-03-26 – 2018-04-01 (×13): 2 via RESPIRATORY_TRACT
  Filled 2018-03-25: qty 8.8

## 2018-03-25 MED ORDER — PANTOPRAZOLE SODIUM 40 MG PO TBEC
40.0000 mg | DELAYED_RELEASE_TABLET | Freq: Every day | ORAL | Status: DC
  Administered 2018-03-26 – 2018-04-01 (×7): 40 mg via ORAL
  Filled 2018-03-25 (×7): qty 1

## 2018-03-25 MED ORDER — ACETAMINOPHEN 650 MG RE SUPP: 650.0000 mg | RECTAL | Status: DC | PRN

## 2018-03-25 MED ORDER — MEMANTINE HCL 10 MG PO TABS
10.0000 mg | ORAL_TABLET | Freq: Two times a day (BID) | ORAL | Status: DC
  Administered 2018-03-26 – 2018-04-01 (×14): 10 mg via ORAL
  Filled 2018-03-25 (×15): qty 1

## 2018-03-25 MED ORDER — LORAZEPAM 1 MG PO TABS: 1.0000 mg | ORAL_TABLET | Freq: Four times a day (QID) | ORAL | Status: AC | PRN

## 2018-03-25 MED ORDER — THIAMINE HCL 100 MG/ML IJ SOLN
100.0000 mg | Freq: Every day | INTRAMUSCULAR | Status: DC
  Filled 2018-03-25 (×2): qty 2

## 2018-03-25 MED ORDER — IOPAMIDOL (ISOVUE-370) INJECTION 76%
75.0000 mL | Freq: Once | INTRAVENOUS | Status: AC | PRN
  Administered 2018-03-25: 75 mL via INTRAVENOUS

## 2018-03-25 MED ORDER — ALLOPURINOL 100 MG PO TABS
300.0000 mg | ORAL_TABLET | Freq: Every day | ORAL | Status: DC
  Administered 2018-03-26 – 2018-04-01 (×7): 300 mg via ORAL
  Filled 2018-03-25 (×7): qty 3

## 2018-03-25 MED ORDER — DULOXETINE HCL 60 MG PO CPEP
60.0000 mg | ORAL_CAPSULE | Freq: Every day | ORAL | Status: DC
  Administered 2018-03-26 – 2018-04-01 (×7): 60 mg via ORAL
  Filled 2018-03-25 (×7): qty 1

## 2018-03-25 MED ORDER — ADULT MULTIVITAMIN W/MINERALS CH
1.0000 | ORAL_TABLET | Freq: Every day | ORAL | Status: DC
  Administered 2018-03-25 – 2018-04-01 (×8): 1 via ORAL
  Filled 2018-03-25 (×8): qty 1

## 2018-03-25 MED ORDER — LEVETIRACETAM 500 MG PO TABS
500.0000 mg | ORAL_TABLET | Freq: Two times a day (BID) | ORAL | Status: DC
  Administered 2018-03-26 – 2018-04-01 (×14): 500 mg via ORAL
  Filled 2018-03-25 (×14): qty 1

## 2018-03-25 MED ORDER — FOLIC ACID 1 MG PO TABS: 1.0000 mg | ORAL_TABLET | Freq: Every day | ORAL | Status: DC

## 2018-03-25 MED ORDER — ENOXAPARIN SODIUM 40 MG/0.4ML ~~LOC~~ SOLN
40.0000 mg | SUBCUTANEOUS | Status: DC
  Administered 2018-03-25 – 2018-03-31 (×7): 40 mg via SUBCUTANEOUS
  Filled 2018-03-25 (×7): qty 0.4

## 2018-03-25 MED ORDER — ATORVASTATIN CALCIUM 80 MG PO TABS
80.0000 mg | ORAL_TABLET | Freq: Every day | ORAL | Status: DC
  Administered 2018-03-26 – 2018-03-31 (×6): 80 mg via ORAL
  Filled 2018-03-25 (×6): qty 1

## 2018-03-25 NOTE — ED Triage Notes (Signed)
Pt from home via ems; c/o new onset L leg weakness and slurred speech; pt has hx of stroke in December 2019; residual L facial droop and L arm weakness from previous stroke; LSW 1815 last night; pt alert and oriented x 4

## 2018-03-25 NOTE — Consult Note (Addendum)
NEURO HOSPITALIST  CONSULT   Requesting Physician: Dr.     Laurel Dimmer Complaint:  Left side paralysis  History obtained from:  Patient     HPI:                                                                                                                                         [name] is an 68 y.o. male  With PMH CVA ( 2019) with residual left side deficits, HTN, HLD, DM2, CHF, CAD, ETOH abuse, COPD who presented to [facility] ed as a code stroke for left side paralysis and slurred speech.    Yesterday patient was home and noticed that his left leg was weaker than normal. Patient endorses that he has had leg weakness bilaterally since 2017. Patient has had strokes in the past. He states his last was 01/2018 and he normally has left arm weakness. yesterday the left leg weakness and slurred speech was new. Patient endorses smoking crack. He last smoked crack 03/24/2018. occasional ETOH use. Endorses smoking.  ED course:  CTH: no hemorrhage CTA: NO LVO   Hx stroke/TIA:  01/2018: right MCA infarcts       10/2017: right MCA territory and watershed infarcts secondary to occluded R ICA stent in setting of  multiple uncontrolled risk factors 6/2018patchy multifocal acute ischemic right MCA territory infarcts - likely embolic from right ICA stent 7/2016New Non-dominant right posterior frontal infarct secondary to hypotension in setting of severe R paraclinoid stenosis 6/2016Non-dominant watershed infarcts likely due to hypotension with intracranial right carotid stenosis. Date last known well: 03/24/2018 Time last known well:1815 tPA Given: No: outside of window for  Modified Rankin: Rankin Score=3 NIHSS:14     Past Medical History:  Diagnosis Date  . Alcohol abuse    H/O  . Anxiety   . Arthritis   . Asthma   . CAD (coronary artery disease) 05/27/10   Cath: severe single vessell CAD left cx midportion obtuse marginal 2 to 3.  [name] CHF  (congestive heart failure) ([place])   . COPD (chronic obstructive pulmonary disease) ([place])   . COPD (chronic obstructive pulmonary disease) ([place])   . Depression   . Diabetes mellitus, type 2 ([place]) 03/13/2015  . GERD (gastroesophageal reflux disease)   . HCAP (healthcare-associated pneumonia) 10/15/2015  . History of DVT of lower extremity   . Hyperlipemia   . Hyperlipidemia   . Hypertension   . Myocardial infarction ([place]) 2000  . Orbital fracture 12/2012  . OSA on CPAP   . Peripheral vascular disease, unspecified ([place])    08/20/10 doppler: increase in right ABI post-op. Left ABI stable.  S/P bi-fem bypass surgery  . Pneumonia 04/04/2012  . Shortness of breath   . Stroke ([place])   . Tobacco abuse     Past Surgical History:  Procedure Laterality Date  . abdoninal ao angio & bifem angio  05/27/10   Patent graft, occluded bil stents with no retrograde flow into the hypogastric arteries. 100% occl left ant. tibial artery, 70% to 80% to 100% stenosis right superficial fem artery above adductor canal. 100 % occl right ant. tibial vessell  . Aortogram w/ PTCA  09/292003  . BACK SURGERY    . CARDIAC CATHETERIZATION  05/27/10   severe CAD left cx  . CHOLECYSTECTOMY    . ESOPHAGOGASTRIC FUNDOPLICATION    . EYE SURGERY    . FEMORAL BYPASS  08/19/10   Right Fem-Pop  . IR ANGIO INTRA EXTRACRAN SEL COM CAROTID INNOMINATE BILAT MOD SED  07/20/2016  . IR ANGIO INTRA EXTRACRAN SEL COM CAROTID INNOMINATE BILAT MOD SED  01/14/2018  . IR ANGIO VERTEBRAL SEL SUBCLAVIAN INNOMINATE UNI R MOD SED  07/20/2016  . IR ANGIO VERTEBRAL SEL VERTEBRAL BILAT MOD SED  01/14/2018  . IR ANGIO VERTEBRAL SEL VERTEBRAL UNI L MOD SED  07/20/2016  . IR RADIOLOGIST EVAL & MGMT  05/27/2016  . IR US GUIDE VASC ACCESS RIGHT  01/14/2018  . JOINT REPLACEMENT    . PERIPHERAL VASCULAR CATHETERIZATION N/A 12/10/2014   Procedure: Abdominal Aortogram;  Surgeon: Angelia Mould, MD;  Location: [facility];  Service: Cardiovascular;   Laterality: N/A;  . RADIOLOGY WITH ANESTHESIA N/A 02/08/2015   Procedure: RADIOLOGY WITH ANESTHESIA;  Surgeon: Luanne Bras, MD;  Location: [facility];  Service: Radiology;  Laterality: N/A;  . TEE WITHOUT CARDIOVERSION N/A 08/29/2014   Procedure: TRANSESOPHAGEAL ECHOCARDIOGRAM (TEE);  Surgeon: Josue Hector, MD;  Location: [facility];  Service: Cardiovascular;  Laterality: N/A;  . TOTAL KNEE ARTHROPLASTY      Family History  Problem Relation Age of Onset  . Heart attack Mother   . Heart failure Mother   . Cirrhosis Father   . Heart failure Brother   . Cancer Brother   . Hypertension Neg Hx        UNKNOWN  . Stroke Neg Hx        UNKNOWN         Social History:  reports that he has been smoking cigars and cigarettes. He started smoking about 57 years ago. He has a 25.00 pack-year smoking history. He has quit using smokeless tobacco.  His smokeless tobacco use included chew. He reports current alcohol use. He reports current drug use. Drug: Cocaine.  Allergies:  Allergies  Allergen Reactions  . Darvocet [Propoxyphene N-Acetaminophen] Hives  . Haldol [Haloperidol Decanoate] Hives  . Acetaminophen Nausea Only    Upset stomach, tolerates Hydrocodone/APAP if taken with food  . Metformin And Related Nausea And Vomiting  . Norco [Hydrocodone-Acetaminophen] Nausea And Vomiting    Tolerates if taken with food    Medications:  Current Facility-Administered Medications  Medication Dose Route Frequency Provider Last Rate Last Dose  . sodium chloride flush (NS) 0.9 % injection 3 mL  3 mL Intravenous Once Little, Wenda Overland, MD       Current Outpatient Medications  Medication Sig Dispense Refill  . acetaminophen (TYLENOL) 325 MG tablet Take 2 tablets (650 mg total) by mouth every 4 (four) hours as needed for mild pain (or temp > 37.5 C (99.5 F)).    [name] allopurinol  (ZYLOPRIM) 300 MG tablet Take 1 tablet (300 mg total) by mouth daily. 30 tablet 3  . aspirin 325 MG tablet Take 1 tablet (325 mg total) by mouth daily. (Patient taking differently: Take 81 mg by mouth daily. )    . atorvastatin (LIPITOR) 80 MG tablet Take 1 tablet (80 mg total) by mouth daily at 6 PM. 30 tablet 3  . baclofen (LIORESAL) 10 MG tablet Take 1 tablet (10 mg total) by mouth at bedtime. 30 each 0  . carvedilol (COREG) 3.125 MG tablet Take 1 tablet (3.125 mg total) by mouth 2 (two) times daily with a meal.    . clopidogrel (PLAVIX) 75 MG tablet Take 1 tablet (75 mg total) by mouth daily. 30 tablet 3  . DULoxetine (CYMBALTA) 60 MG capsule Take 1 capsule (60 mg total) by mouth daily. 30 capsule 3  . folic acid (FOLVITE) 1 MG tablet Take 1 tablet (1 mg total) by mouth daily.    . hydrOXYzine (ATARAX/VISTARIL) 25 MG tablet Take 1 tablet (25 mg total) by mouth every 8 (eight) hours as needed for itching. 30 tablet 0  . levETIRAcetam (KEPPRA) 500 MG tablet Take 1 tablet (500 mg total) by mouth 2 (two) times daily. 60 tablet 3  . memantine (NAMENDA) 10 MG tablet Take 1 tablet (10 mg total) by mouth 2 (two) times daily. 6 tablet 0  . mometasone-formoterol (DULERA) 200-5 MCG/ACT AERO Inhale 2 puffs into the lungs 2 (two) times daily.    . nicotine (NICODERM CQ - DOSED IN MG/24 HOURS) 21 mg/24hr patch Place 1 patch (21 mg total) onto the skin daily. 28 patch 0  . pantoprazole (PROTONIX) 40 MG tablet Take 1 tablet (40 mg total) by mouth daily. 30 tablet 3  . pregabalin (LYRICA) 150 MG capsule Take 1 capsule (150 mg total) by mouth 2 (two) times daily. 180 capsule 0     ROS:                                                                                                                                       ROS was performed and is negative except as noted in HPI  General Examination:  There were no  vitals taken for this visit.  HEENT-  Normocephalic, no lesions, without obvious abnormality.  Normal external eye and conjunctiva. Cardiovascular- S1-S2 audible, pulses palpable throughout  Lungs-no rhonchi or wheezing noted, no excessive working breathing.  Saturations within normal limits Abdomen- All 4 quadrants palpated and non tender Extremities- Warm, dry and intact Musculoskeletal-edema in BLE Skin-warm and dry, bilateral feet with excessive dry skin.  Neurological Examination Mental Status: Alert, oriented, thought content appropriate. Dysarthria noted  Speech fluent without evidence of aphasia.  Able to follow commands without difficulty. Cranial Nerves: II:  Visual fields grossly normal,  III,IV, VI: ptosis not present, extra-ocular motions intact bilaterally, pupils equal, round, reactive to light and accommodation V,VII: smile asymmetric, left facial droop. facial light touch sensation normal bilaterally VIII: hearing normal bilaterally IX,X: uvula rises midline XI: bilateral shoulder shrug XII: midline tongue extension Motor: Right : Upper extremity   5/5  Left:     Upper extremity   2/5  Lower extremity   4/5   Lower extremity   2/5 Tone and bulk:normal tone throughout; no atrophy noted Sensory:light touch intact throughout, bilaterally Deep Tendon Reflexes: 2+ and symmetric biceps and patella Plantars: Right: downgoing   Left: downgoing Cerebellar:  normal finger-to-nose on right. Unable to perform on left,  Patient inable to perform bilaterally. Gait: deferred   Lab Results: Basic Metabolic Panel: Recent Labs  Lab 03/25/18 1453  CREATININE 0.80    CBC: Recent Labs  Lab 03/25/18 1448  WBC 6.2  NEUTROABS 3.3  HGB 11.3*  HCT 39.3  MCV 86.6  PLT 223    CBG: Recent Labs  Lab 03/25/18 1450  GLUCAP 139*    Imaging: Ct Angio Head W Or Wo Contrast  Result Date: 03/25/2018 CLINICAL DATA:  68 year old male code stroke presentation with left side  paralysis. Occluded right ICA and right frontal lobe infarct in December. EXAM: CT ANGIOGRAPHY HEAD AND NECK TECHNIQUE: Multidetector CT imaging of the head and neck was performed using the standard protocol during bolus administration of intravenous contrast. Multiplanar CT image reconstructions and MIPs were obtained to evaluate the vascular anatomy. Carotid stenosis measurements (when applicable) are obtained utilizing NASCET criteria, using the distal internal carotid diameter as the denominator. CONTRAST:  71mL ISOVUE-370 IOPAMIDOL (ISOVUE-370) INJECTION 76% COMPARISON:  Plain head CT earlier today. Cerebral angiogram 01/14/2018. brain MRI 01/13/2018. CTA 10/25/2017. FINDINGS: CT HEAD Brain: Stable gray-white matter differentiation throughout the brain. Patchy right MCA hypodensity appears stable from earlier today. No acute intracranial hemorrhage identified. No midline shift, mass effect, or evidence of intracranial mass lesion. No ventriculomegaly. Calvarium and skull base: Stable. Paranasal sinuses: Chronic left maxillary sinusitis. Orbits: No acute finding.  Previous left orbital floor ORIF. CTA NECK Skeleton: Absent dentition. C2-C3 ankylosis versus incomplete segmentation. No acute osseous abnormality identified. Upper chest: Stable since September and negative. Other neck: Stable and negative. Aortic arch: Extensive Calcified aortic atherosclerosis. Three vessel arch configuration. Right carotid system: Bulky plaque in the brachiocephalic artery appears stable the right CCA origin remains patent. The right CCA is patent to the level of the right ICA stent, but the stent is occluded as before. No reconstituted flow in the cervical right ICA. Left carotid system: Soft and calcified left CCA origin plaque appears stable with less than 50 % stenosis with respect to the distal vessel. Multifocal calcified plaque at the left carotid bifurcation and continuing to the left ICA bulb with less than 50 % stenosis  with respect to the distal vessel.  The left ICA remains patent to the skull base. Vertebral arteries: The right subclavian artery origin remains patent despite plaque. Stable calcified plaque at the right vertebral artery origin resulting in mild to moderate stenosis. Stable right V1 and V3 calcified plaque with mild stenosis. The right vertebral is non dominant and patent to the skull base. Stable proximal left subclavian artery plaque without significant stenosis. The left vertebral artery is dominant. Calcified plaque at the left vertebral artery origin results in mild stenosis and appears stable. (Series 10, image 111). Intermittent left V2 and V3 calcified plaque with mild stenosis is stable. The left vertebral remains patent to the skull base, and the left PICA origin is early and patent. CTA HEAD Posterior circulation: Fairly codominant distal vertebral arteries without stenosis. Patent PICA origins and vertebrobasilar junction. Patent basilar artery without stenosis. Normal basilar tip, SCA and PCA origins. Posterior communicating arteries are diminutive or absent. Bilateral PCA branches are normal aside from mild irregularity which is greater on the left. Anterior circulation: The right ICA siphon is reconstituted distally and stable in appearance since September 2019. There is a distal siphon stent which is patent. The right ACA and MCA origins are patent without stenosis, but there is asymmetrically decreased right ACA and MCA enhancement. Still, the right MCA bifurcation is patent and right MCA branches appear stable since September. The anterior communicating artery is normal, but the right ACA remains hypoenhancing compared to the left. No ACA branch occlusion identified. Left ICA siphon is patent with moderate calcified plaque resulting in mild left siphon stenosis. Normal left ICA terminus, left MCA and ACA origin. Normal left A1. Left MCA M1, bifurcation, and left MCA branches are within normal  limits. Venous sinuses: Not evaluated due to early contrast timing. Anatomic variants: Dominant left vertebral artery. Review of the MIP images confirms the above findings IMPRESSION: 1. Negative for emergent large vessel occlusion. 2. Stable occluded cervical Right ICA stent and suboptimal reconstitution of the distal Right ICA terminus. The right ACA and MCA remain patent although with decreased enhancement compared to the left anterior circulation. 3. Left carotid, left anterior circulation and posterior circulation are stable since September. Notable for: - Left carotid calcified plaque without significant stenosis. - multifocal bilateral vertebral artery plaque with mild to moderate right and mild left vertebral artery stenosis. 4. Stable non contrast CT appearance of the brain since 1456 hours today. 5.  Aortic Atherosclerosis (ICD10-I70.0). Salient findings were communicated to Dr. Lorraine Lax at 3:34 pm on 03/25/2018 by text page via the [facility] messaging system. Electronically Signed   By: Genevie Ann M.D.   On: 03/25/2018 15:35   Ct Angio Neck W Or Wo Contrast  Result Date: 03/25/2018 CLINICAL DATA:  68 year old male code stroke presentation with left side paralysis. Occluded right ICA and right frontal lobe infarct in December. EXAM: CT ANGIOGRAPHY HEAD AND NECK TECHNIQUE: Multidetector CT imaging of the head and neck was performed using the standard protocol during bolus administration of intravenous contrast. Multiplanar CT image reconstructions and MIPs were obtained to evaluate the vascular anatomy. Carotid stenosis measurements (when applicable) are obtained utilizing NASCET criteria, using the distal internal carotid diameter as the denominator. CONTRAST:  24mL ISOVUE-370 IOPAMIDOL (ISOVUE-370) INJECTION 76% COMPARISON:  Plain head CT earlier today. Cerebral angiogram 01/14/2018. brain MRI 01/13/2018. CTA 10/25/2017. FINDINGS: CT HEAD Brain: Stable gray-white matter differentiation throughout the brain.  Patchy right MCA hypodensity appears stable from earlier today. No acute intracranial hemorrhage identified. No midline shift, mass effect, or evidence  of intracranial mass lesion. No ventriculomegaly. Calvarium and skull base: Stable. Paranasal sinuses: Chronic left maxillary sinusitis. Orbits: No acute finding.  Previous left orbital floor ORIF. CTA NECK Skeleton: Absent dentition. C2-C3 ankylosis versus incomplete segmentation. No acute osseous abnormality identified. Upper chest: Stable since September and negative. Other neck: Stable and negative. Aortic arch: Extensive Calcified aortic atherosclerosis. Three vessel arch configuration. Right carotid system: Bulky plaque in the brachiocephalic artery appears stable the right CCA origin remains patent. The right CCA is patent to the level of the right ICA stent, but the stent is occluded as before. No reconstituted flow in the cervical right ICA. Left carotid system: Soft and calcified left CCA origin plaque appears stable with less than 50 % stenosis with respect to the distal vessel. Multifocal calcified plaque at the left carotid bifurcation and continuing to the left ICA bulb with less than 50 % stenosis with respect to the distal vessel. The left ICA remains patent to the skull base. Vertebral arteries: The right subclavian artery origin remains patent despite plaque. Stable calcified plaque at the right vertebral artery origin resulting in mild to moderate stenosis. Stable right V1 and V3 calcified plaque with mild stenosis. The right vertebral is non dominant and patent to the skull base. Stable proximal left subclavian artery plaque without significant stenosis. The left vertebral artery is dominant. Calcified plaque at the left vertebral artery origin results in mild stenosis and appears stable. (Series 10, image 111). Intermittent left V2 and V3 calcified plaque with mild stenosis is stable. The left vertebral remains patent to the skull base, and the  left PICA origin is early and patent. CTA HEAD Posterior circulation: Fairly codominant distal vertebral arteries without stenosis. Patent PICA origins and vertebrobasilar junction. Patent basilar artery without stenosis. Normal basilar tip, SCA and PCA origins. Posterior communicating arteries are diminutive or absent. Bilateral PCA branches are normal aside from mild irregularity which is greater on the left. Anterior circulation: The right ICA siphon is reconstituted distally and stable in appearance since September 2019. There is a distal siphon stent which is patent. The right ACA and MCA origins are patent without stenosis, but there is asymmetrically decreased right ACA and MCA enhancement. Still, the right MCA bifurcation is patent and right MCA branches appear stable since September. The anterior communicating artery is normal, but the right ACA remains hypoenhancing compared to the left. No ACA branch occlusion identified. Left ICA siphon is patent with moderate calcified plaque resulting in mild left siphon stenosis. Normal left ICA terminus, left MCA and ACA origin. Normal left A1. Left MCA M1, bifurcation, and left MCA branches are within normal limits. Venous sinuses: Not evaluated due to early contrast timing. Anatomic variants: Dominant left vertebral artery. Review of the MIP images confirms the above findings IMPRESSION: 1. Negative for emergent large vessel occlusion. 2. Stable occluded cervical Right ICA stent and suboptimal reconstitution of the distal Right ICA terminus. The right ACA and MCA remain patent although with decreased enhancement compared to the left anterior circulation. 3. Left carotid, left anterior circulation and posterior circulation are stable since September. Notable for: - Left carotid calcified plaque without significant stenosis. - multifocal bilateral vertebral artery plaque with mild to moderate right and mild left vertebral artery stenosis. 4. Stable non contrast CT  appearance of the brain since 1456 hours today. 5.  Aortic Atherosclerosis (ICD10-I70.0). Salient findings were communicated to Dr. Lorraine Lax at 3:34 pm on 03/25/2018 by text page via the [facility] messaging system. Electronically Signed  By: Genevie Ann M.D.   On: 03/25/2018 15:35   Ct Head Code Stroke Wo Contrast  Result Date: 03/25/2018 CLINICAL DATA:  Code stroke.  Left sided paralysis. EXAM: CT HEAD WITHOUT CONTRAST TECHNIQUE: Contiguous axial images were obtained from the base of the skull through the vertex without intravenous contrast. COMPARISON:  01/12/2018 FINDINGS: Brain: Brainstem and cerebellum remain unremarkable. Left hemisphere shows atrophy with chronic small-vessel ischemic changes. Right hemisphere shows progressive right frontal cortical and subcortical low-density related to a regional infarction that was acute in December of 2019. Older right posterior frontal cortical and subcortical infarction appears the same. Chronic small-vessel ischemic changes appear the same. No sign of hyperacute insult. No hemorrhage, hydrocephalus or extra-axial collection. Vascular: There is atherosclerotic calcification of the major vessels at the base of the brain. Right carotid stent. Skull: No acute finding.  Previous left orbital reconstruction. Sinuses/Orbits: Clear except for chronic mucosal thickening of the left maxillary sinus. Orbits negative. Other: None ASPECTS ([place] Stroke Program Early CT Score) is problematic given the late subacute right frontal infarction. Allowing for that, no acute ischemia is identified. IMPRESSION: 1. No acute finding today. Late subacute infarction in the right frontal cortical and subcortical brain which was acute in December of 2019. Older right posterior frontal cortical and subcortical infarction. 2. ASPECTS is problematic because of the subacute right frontal infarction. 3. These results were communicated to Dr. Lorraine Lax at 3:12 pmon 2/14/2020by text page via the [facility] messaging  system. Electronically Signed   By: Nelson Chimes M.D.   On: 03/25/2018 15:13       Laurey Morale, MSN, NP-C [facility] [phone]  03/25/2018, 2:55 PM   Attending physician note to follow with Assessment and plan .   Assessment: 68 y.o. male  With PMH CVA ( 2019) with residual left side deficits, HTN, HLD, DM2, CHF, CAD, ETOH abuse, COPD who presented to [facility] ed as a code stroke for left side paralysis and slurred speech. CTH: no hemorrhage. No TPA d./t patient being outside of window. CTA: no LVO, but chronic right ICA occlusion. Patient also known to have uncontrolled risk factors. Stroke Risk Factors - diabetes mellitus, hyperlipidemia, hypertension and smoking    Recommendations: -- BP goal : Permissive HTN upto 220/110 mmHg  --MRI Brain  --CTA --Echocardiogram -- ASA -- High intensity Statin if LDL > 70 -- HgbA1c, fasting lipid panel -- PT consult, OT consult, Speech consult --Telemetry monitoring --Frequent neuro checks --smoking cessation --Stroke swallow screen  --please page stroke NP  Or  PA  Or MD from 8am -4 pm  as this patient from this time will be  followed by the stroke.   You can look them up on [email or web address]  Password [email or web address]    NEUROHOSPITALIST ADDENDUM Performed a face to face diagnostic evaluation.   I have reviewed the contents of history and physical exam as documented by PA/ARNP/Resident and agree with above documentation.  I have discussed and formulated the above plan as documented. Edits to the note have been made as needed.  Impression: 68 year old male with past medical history of prior hemispheric CVA with left hemiparesis with chronic right ICA occlusion presents with worsening left-sided weakness.  On examination patient is plegic on the left side.  CT redemonstrates occlusion.  Not a candidate for TPA as well outside the window.  Plan: Infectious work-up to rule out recrudescence of old stroke symptoms.  MRI brain to assess for  new stroke.  Stroke team to follow.  Karena Addison Jun Osment MD [facility] [phone]   If 7pm to 7am, please call on call as listed on AMION.

## 2018-03-25 NOTE — Code Documentation (Signed)
68 yo male coming from home where he was noted to have worsening left sided weakness with left leg flaccid that started last night. Pt was noted to be at baseline - left arm flaccid and left facial droop - at 1815. Pt reported having his left leg weakness and slurred speech that started since last night. EMS called and activated a Code Stroke. Stroke team met patient at the bridge. Initial NIHSS 14 due to left sided weakness, slurred speech, and left facial droop. Pt is LVO negative and outside window for tPA. CT Head noted to have evidence of old infarct with no hemorrhage. CTA completed. Pt is not an IR candidate due to no new occlusion. Handoff given to [facility], Therapist, sports.

## 2018-03-25 NOTE — Progress Notes (Signed)
Pt admitted to the unit from ED: pt A&O 4; pt oriented to the unit and room; fall/safety precaution and prevention education completed. VSS; telemetry applied and verified with CCMD; NT called to second verify. Pt passed swallow screen and MD paged awaiting on response. Reported off to oncoming RN. Delia Heady RN   03/25/18 1901  Vital Signs  BP (!) 125/49  BP Location Left Arm  Patient Position (if appropriate) Lying  BP Method Automatic  Pulse Rate 74  Pulse Rate Source Dinamap  Resp 20  Temp 98.1 F (36.7 C)  Temp Source Oral  Oxygen Therapy  SpO2 99 %  O2 Device Room Air  Height and Weight  Height 6' (1.829 m)  Height Method Stated  Weight 101.5 kg  Weight Method Actual  BSA (Calculated - sq m) 2.27 sq meters  BMI (Calculated) 30.34

## 2018-03-25 NOTE — Progress Notes (Addendum)
Family Medicine Teaching Service Daily Progress Note Intern Pager: [phone]  Patient name: [name] Medical record number: [id number] Date of birth: 02/06/1951 Age: 68 y.o. Gender: male  Primary Care Provider: Charlott Rakes, MD Consultants: Neuro Code Status: DNR  Pt Overview and Major Events to Date:  2/14 - admitted for new onset stroke  Assessment and Plan: [name] is a 68 y.o. male presenting with a one day history of left sided leg and face weakness (pre-existing left arm weakness) consistent with acute ischemic stroke in the right anterior hemisphere. PMH is significant for multiple CVA, DVT, CHF, polysubstance use disorder, dementia, GERD, Gout.    Acute Stroke with left sided weakness, improving - MRI with acute ischemia within the anterior right hemisphere in the right ACA distribution with a small foci present in the MCA consistent with presentation. Passed swallow eval, currently on thickened liquid diet but will have formal SLP eval. Has increased strength in LLE this am with persistent LUE weakness. Awaiting risk stratification labs. Encouraged smoking cessation this am. - neuro consult, appreciate recs - f/u Echo  - permissive HTN  - f/u risk stratification labs - PT/OT eval  - d/c IVF - tylenol PRN for pain  COPD, stable - patient takes dulera BID at home. Endorses constant cough. CTAB with upper airway noises transmitted on exm. - continue home med  Hx of seizure disorder. - Noted on previous hospital followup in December.  He takes keppra 500 mg BID at home.  - continue home [place].   HTN - Allowing for permissive HTN, mostly normotensive overnight, max SBP 150s.  Patient is on Coreg at home --PRN hydralazine for systolic greater than 542 and diastolic greater than 706.  Given patient recently use of crack cocaine will avoid beta-blocker.  GERD - patient takes pantoprazole 40mg  at home.   - continue home meds  Gout - patient takes  allopurinol 300mg  at home.   - continue home meds  CHF - Patient takes carvedilol 3.125mg  BID. Will currently hold given cocaine use.   - Follow-up for repeat echo  Hx of DVT - DVT listed in problem list.  Is not on anticoagulation currently because he is a fall risk.  Patient is currently on Plavix and aspirin which is increases risk of bleeding.  He has a history of paroxysmal atrial fibrillation with elevated chads-VASC score.   Alcohol abuse - patient states he only drinks one beer a month.  CIWA 0 overnight. EtOH neg on admission. - monitor CIWA scores for withdrawal.    Tobacco use disorder -  Patient smokes ten cigars daily, though he states they are small cigars.   - nicotine patch as needed.   Substance use disorder - patient admitted to smoking crack cocaine regularly on admission. UDS + cocaine. - social work consult   Dementia - likely vascular dementia from his history of CVA. Patient takes namenda at home.  A&Ox3 this am. - continue home meds  Normocytic Anemia Hb 11.3 on admission, awaiting am labs. Has been persistently ~11 since 01/2018. Alcohol use may contribute although prior to 01/2018 was wnl. No evidence of bleeding on exam. - consider obtaining ferritin, iron panel  FEN/GI: DYS 3 diet  Prophylaxis: lovenox  Disposition: continue inpatient management for stroke  Subjective:  Patient feeling overall well today though does endorse persistent cough and LUQ abdominal discomfort consistent with acid reflux. Reports improved strength in LLE. Still no strength in LUE.  Objective: Temp:  [97.6 F (36.4  C)-98.2 F (36.8 C)] 97.7 F (36.5 C) (02/15 0612) Pulse Rate:  [62-78] 62 (02/15 0612) Resp:  [18-24] 20 (02/14 1901) BP: (111-159)/(49-91) 149/72 (02/15 0612) SpO2:  [95 %-99 %] 95 % (02/15 0612) Weight:  [101.5 kg-106.6 kg] 101.5 kg (02/14 1901) Physical Exam: General: awake, lying in bed comfortably, in NAD Cardiovascular: difficult to appreciate  d/t upper airway noises, regular rate Respiratory: CTAB, upper airway noises transmitted. No rales/wheezes appreciated. Appropriately saturated on RA. Abdomen: soft, TTP in LUQ. Obese abdomen. +BS Extremities: warm and well perfused. MSK: Strength 5/5 to R U/LE, 4/5 to LLE, 3/5 to LUE.  No edema. R grip strength intact, L grip strength not intact. Neuro: Alert and oriented, speech dysarthric.  Extraocular movements intact.  Intact symmetric sensation to light touch of face and extremities bilaterally.  Hearing grossly intact bilaterally.  Tongue protrudes normally with no deviation.  Shoulder shrug diminished on L side. Smile mostly symmetric. Finger to nose normal on R side.  Laboratory: Recent Labs  Lab 03/25/18 1448 03/25/18 1924  WBC 6.2 6.1  HGB 11.3* 11.0*  HCT 39.3 36.5*  PLT 223 214   Recent Labs  Lab 03/25/18 1448 03/25/18 1453 03/25/18 1924  NA 144  --   --   K 4.2  --   --   CL 110  --   --   CO2 25  --   --   BUN 11  --   --   CREATININE 0.92 0.80 0.79  CALCIUM 9.7  --   --   PROT 6.7  --   --   BILITOT 0.4  --   --   ALKPHOS 66  --   --   ALT 14  --   --   AST 19  --   --   GLUCOSE 88  --   --     Imaging/Diagnostic Tests: Ct Angio Head W Or Wo Contrast  Result Date: 03/25/2018 CLINICAL DATA:  68 year old male code stroke presentation with left side paralysis. Occluded right ICA and right frontal lobe infarct in December. EXAM: CT ANGIOGRAPHY HEAD AND NECK TECHNIQUE: Multidetector CT imaging of the head and neck was performed using the standard protocol during bolus administration of intravenous contrast. Multiplanar CT image reconstructions and MIPs were obtained to evaluate the vascular anatomy. Carotid stenosis measurements (when applicable) are obtained utilizing NASCET criteria, using the distal internal carotid diameter as the denominator. CONTRAST:  68mL ISOVUE-370 IOPAMIDOL (ISOVUE-370) INJECTION 76% COMPARISON:  Plain head CT earlier today. Cerebral  angiogram 01/14/2018. brain MRI 01/13/2018. CTA 10/25/2017. FINDINGS: CT HEAD Brain: Stable gray-white matter differentiation throughout the brain. Patchy right MCA hypodensity appears stable from earlier today. No acute intracranial hemorrhage identified. No midline shift, mass effect, or evidence of intracranial mass lesion. No ventriculomegaly. Calvarium and skull base: Stable. Paranasal sinuses: Chronic left maxillary sinusitis. Orbits: No acute finding.  Previous left orbital floor ORIF. CTA NECK Skeleton: Absent dentition. C2-C3 ankylosis versus incomplete segmentation. No acute osseous abnormality identified. Upper chest: Stable since September and negative. Other neck: Stable and negative. Aortic arch: Extensive Calcified aortic atherosclerosis. Three vessel arch configuration. Right carotid system: Bulky plaque in the brachiocephalic artery appears stable the right CCA origin remains patent. The right CCA is patent to the level of the right ICA stent, but the stent is occluded as before. No reconstituted flow in the cervical right ICA. Left carotid system: Soft and calcified left CCA origin plaque appears stable with less than 50 % stenosis with  respect to the distal vessel. Multifocal calcified plaque at the left carotid bifurcation and continuing to the left ICA bulb with less than 50 % stenosis with respect to the distal vessel. The left ICA remains patent to the skull base. Vertebral arteries: The right subclavian artery origin remains patent despite plaque. Stable calcified plaque at the right vertebral artery origin resulting in mild to moderate stenosis. Stable right V1 and V3 calcified plaque with mild stenosis. The right vertebral is non dominant and patent to the skull base. Stable proximal left subclavian artery plaque without significant stenosis. The left vertebral artery is dominant. Calcified plaque at the left vertebral artery origin results in mild stenosis and appears stable. (Series 10,  image 111). Intermittent left V2 and V3 calcified plaque with mild stenosis is stable. The left vertebral remains patent to the skull base, and the left PICA origin is early and patent. CTA HEAD Posterior circulation: Fairly codominant distal vertebral arteries without stenosis. Patent PICA origins and vertebrobasilar junction. Patent basilar artery without stenosis. Normal basilar tip, SCA and PCA origins. Posterior communicating arteries are diminutive or absent. Bilateral PCA branches are normal aside from mild irregularity which is greater on the left. Anterior circulation: The right ICA siphon is reconstituted distally and stable in appearance since September 2019. There is a distal siphon stent which is patent. The right ACA and MCA origins are patent without stenosis, but there is asymmetrically decreased right ACA and MCA enhancement. Still, the right MCA bifurcation is patent and right MCA branches appear stable since September. The anterior communicating artery is normal, but the right ACA remains hypoenhancing compared to the left. No ACA branch occlusion identified. Left ICA siphon is patent with moderate calcified plaque resulting in mild left siphon stenosis. Normal left ICA terminus, left MCA and ACA origin. Normal left A1. Left MCA M1, bifurcation, and left MCA branches are within normal limits. Venous sinuses: Not evaluated due to early contrast timing. Anatomic variants: Dominant left vertebral artery. Review of the MIP images confirms the above findings IMPRESSION: 1. Negative for emergent large vessel occlusion. 2. Stable occluded cervical Right ICA stent and suboptimal reconstitution of the distal Right ICA terminus. The right ACA and MCA remain patent although with decreased enhancement compared to the left anterior circulation. 3. Left carotid, left anterior circulation and posterior circulation are stable since September. Notable for: - Left carotid calcified plaque without significant  stenosis. - multifocal bilateral vertebral artery plaque with mild to moderate right and mild left vertebral artery stenosis. 4. Stable non contrast CT appearance of the brain since 1456 hours today. 5.  Aortic Atherosclerosis (ICD10-I70.0). Salient findings were communicated to Dr. Lorraine Lax at 3:34 pm on 03/25/2018 by text page via the [facility] messaging system. Electronically Signed   By: Genevie Ann M.D.   On: 03/25/2018 15:35   Ct Angio Neck W Or Wo Contrast  Result Date: 03/25/2018 CLINICAL DATA:  68 year old male code stroke presentation with left side paralysis. Occluded right ICA and right frontal lobe infarct in December. EXAM: CT ANGIOGRAPHY HEAD AND NECK TECHNIQUE: Multidetector CT imaging of the head and neck was performed using the standard protocol during bolus administration of intravenous contrast. Multiplanar CT image reconstructions and MIPs were obtained to evaluate the vascular anatomy. Carotid stenosis measurements (when applicable) are obtained utilizing NASCET criteria, using the distal internal carotid diameter as the denominator. CONTRAST:  58mL ISOVUE-370 IOPAMIDOL (ISOVUE-370) INJECTION 76% COMPARISON:  Plain head CT earlier today. Cerebral angiogram 01/14/2018. brain MRI 01/13/2018. CTA 10/25/2017.  FINDINGS: CT HEAD Brain: Stable gray-white matter differentiation throughout the brain. Patchy right MCA hypodensity appears stable from earlier today. No acute intracranial hemorrhage identified. No midline shift, mass effect, or evidence of intracranial mass lesion. No ventriculomegaly. Calvarium and skull base: Stable. Paranasal sinuses: Chronic left maxillary sinusitis. Orbits: No acute finding.  Previous left orbital floor ORIF. CTA NECK Skeleton: Absent dentition. C2-C3 ankylosis versus incomplete segmentation. No acute osseous abnormality identified. Upper chest: Stable since September and negative. Other neck: Stable and negative. Aortic arch: Extensive Calcified aortic atherosclerosis. Three  vessel arch configuration. Right carotid system: Bulky plaque in the brachiocephalic artery appears stable the right CCA origin remains patent. The right CCA is patent to the level of the right ICA stent, but the stent is occluded as before. No reconstituted flow in the cervical right ICA. Left carotid system: Soft and calcified left CCA origin plaque appears stable with less than 50 % stenosis with respect to the distal vessel. Multifocal calcified plaque at the left carotid bifurcation and continuing to the left ICA bulb with less than 50 % stenosis with respect to the distal vessel. The left ICA remains patent to the skull base. Vertebral arteries: The right subclavian artery origin remains patent despite plaque. Stable calcified plaque at the right vertebral artery origin resulting in mild to moderate stenosis. Stable right V1 and V3 calcified plaque with mild stenosis. The right vertebral is non dominant and patent to the skull base. Stable proximal left subclavian artery plaque without significant stenosis. The left vertebral artery is dominant. Calcified plaque at the left vertebral artery origin results in mild stenosis and appears stable. (Series 10, image 111). Intermittent left V2 and V3 calcified plaque with mild stenosis is stable. The left vertebral remains patent to the skull base, and the left PICA origin is early and patent. CTA HEAD Posterior circulation: Fairly codominant distal vertebral arteries without stenosis. Patent PICA origins and vertebrobasilar junction. Patent basilar artery without stenosis. Normal basilar tip, SCA and PCA origins. Posterior communicating arteries are diminutive or absent. Bilateral PCA branches are normal aside from mild irregularity which is greater on the left. Anterior circulation: The right ICA siphon is reconstituted distally and stable in appearance since September 2019. There is a distal siphon stent which is patent. The right ACA and MCA origins are patent  without stenosis, but there is asymmetrically decreased right ACA and MCA enhancement. Still, the right MCA bifurcation is patent and right MCA branches appear stable since September. The anterior communicating artery is normal, but the right ACA remains hypoenhancing compared to the left. No ACA branch occlusion identified. Left ICA siphon is patent with moderate calcified plaque resulting in mild left siphon stenosis. Normal left ICA terminus, left MCA and ACA origin. Normal left A1. Left MCA M1, bifurcation, and left MCA branches are within normal limits. Venous sinuses: Not evaluated due to early contrast timing. Anatomic variants: Dominant left vertebral artery. Review of the MIP images confirms the above findings IMPRESSION: 1. Negative for emergent large vessel occlusion. 2. Stable occluded cervical Right ICA stent and suboptimal reconstitution of the distal Right ICA terminus. The right ACA and MCA remain patent although with decreased enhancement compared to the left anterior circulation. 3. Left carotid, left anterior circulation and posterior circulation are stable since September. Notable for: - Left carotid calcified plaque without significant stenosis. - multifocal bilateral vertebral artery plaque with mild to moderate right and mild left vertebral artery stenosis. 4. Stable non contrast CT appearance of the brain  since 1456 hours today. 5.  Aortic Atherosclerosis (ICD10-I70.0). Salient findings were communicated to Dr. Lorraine Lax at 3:34 pm on 03/25/2018 by text page via the [facility] messaging system. Electronically Signed   By: Genevie Ann M.D.   On: 03/25/2018 15:35   Mr Brain Wo Contrast (neuro Protocol)  Result Date: 03/25/2018 CLINICAL DATA:  Left arm weakness and facial droop. EXAM: MRI HEAD WITHOUT CONTRAST TECHNIQUE: Multiplanar, multiecho pulse sequences of the brain and surrounding structures were obtained without intravenous contrast. COMPARISON:  Head CT and CTA 03/25/2018 FINDINGS: BRAIN: There  is abnormal diffusion restriction predominantly within the right anterior cerebral artery territory, with small foci of diffusion abnormality within the anterior portion of the right middle cerebral artery territory. The midline structures are normal. No midline shift or other mass effect. Confluent hyperintense T2-weighted signal within the right MCA corona radiata. There is an old posterior right parietal infarct. Multifocal white matter hyperintensity bilaterally otherwise. There is hyperintense T2-weighted signal also within the right midbrain and pons. There is generalized atrophy. Susceptibility-sensitive sequences show no chronic microhemorrhage or superficial siderosis. VASCULAR: Loss of the normal right ICA flow void. SKULL AND UPPER CERVICAL SPINE: Calvarial bone marrow signal is normal. There is no skull base mass. Visualized upper cervical spine and soft tissues are normal. SINUSES/ORBITS: Moderate left maxillary mucosal thickening. There are bilateral lens replacements. IMPRESSION: 1. Acute ischemia within the anterior right hemisphere, predominantly within the right ACA territory, with small foci also present in the MCA territory. 2. Loss of the normal right ICA flow void, consistent with known occlusion. 3. No hemorrhage or mass effect. 4. 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 Electronically Signed   By: Ulyses Jarred M.D.   On: 03/25/2018 18:37   Ct Head Code Stroke Wo Contrast  Result Date: 03/25/2018 CLINICAL DATA:  Code stroke.  Left sided paralysis. EXAM: CT HEAD WITHOUT CONTRAST TECHNIQUE: Contiguous axial images were obtained from the base of the skull through the vertex without intravenous contrast. COMPARISON:  01/12/2018 FINDINGS: Brain: Brainstem and cerebellum remain unremarkable. Left hemisphere shows atrophy with chronic small-vessel ischemic changes. Right hemisphere shows progressive right frontal cortical and subcortical low-density related to a regional infarction  that was acute in December of 2019. Older right posterior frontal cortical and subcortical infarction appears the same. Chronic small-vessel ischemic changes appear the same. No sign of hyperacute insult. No hemorrhage, hydrocephalus or extra-axial collection. Vascular: There is atherosclerotic calcification of the major vessels at the base of the brain. Right carotid stent. Skull: No acute finding.  Previous left orbital reconstruction. Sinuses/Orbits: Clear except for chronic mucosal thickening of the left maxillary sinus. Orbits negative. Other: None ASPECTS ([place] Stroke Program Early CT Score) is problematic given the late subacute right frontal infarction. Allowing for that, no acute ischemia is identified. IMPRESSION: 1. No acute finding today. Late subacute infarction in the right frontal cortical and subcortical brain which was acute in December of 2019. Older right posterior frontal cortical and subcortical infarction. 2. ASPECTS is problematic because of the subacute right frontal infarction. 3. These results were communicated to Dr. Lorraine Lax at 3:12 pmon 2/14/2020by text page via the [facility] messaging system. Electronically Signed   By: Nelson Chimes M.D.   On: 03/25/2018 15:13   Rory Percy, DO 03/26/2018, 6:16 AM PGY-2, [place] Intern pager: [phone], text pages welcome

## 2018-03-25 NOTE — H&P (Addendum)
[facility] Admission History and Physical Service Pager: [phone]  Patient name: [name] Medical record number: [id number] Date of birth: 09/27/1950 Age: 68 y.o. Gender: male  Primary Care Provider: Charlott Rakes, MD Consultants: neuro Code Status: DNR  Chief Complaint: left sided weakness  Assessment and Plan: [name] is a 68 y.o. male presenting with a one day history of left sided leg and face weakness (pre-existing left arm weakness) consistent with acute ischemic stroke in the right anterior hemisphere. PMH is significant for multiple CVA, DVT, CHF, polysubstance use disorder, dementia, GERD, Gout.    Acute Stroke with left sided weakness, improving- patient has an extensive history of CVA in the past with most recent one being in December.  He has residual paralysis of the left arm as a result.  This episode began last night at approximately 6pm.  It has been roughly 24 hours since his last known normal. He takes high intensity statin and DAPT at home. CBC and BMP is normal.  CT head showed no acute findings, but a subacute infarction in the right frontal cortical/subcortical brain from December and older posterior frontal cortical and subcortical infarction. On exam he has preexisting paralysis of the left arm with paresis of the left lower extremity and left side of his face resulting in dysarthria. Sensation is intact in the left leg. No obvious aphasia or cerebellar deficits.  Possible infarct of the internal capsule given his physical exam findings or possibly MCA/ACA.  MRI showed an acute ischemia within the anterior right hemisphere in the right ACA distribution with a small foci present in the MCA.  MRI finding consistent with patient's presenting deficits.    - admit to inpatient, med tele.  Dr. Ardelia Mems attending.  - neuro consult, appreciate recs - f/u Echo - NPO until swallow eval.  - hold home meds while NPO  - permissive  HTN - f/u risk stratification labs - PT/OT/SLP eval and treat - 71ml/hr NS mIVF - continuous cardiac monitoring - continuous pulse ox - vitals per routine - AM CBC, BMP - tylenol PRN for pain  COPD - patient takes dulera BID at home.  Patient denies any shortness of breath, cough or other URI symptoms.  He continues to smoke small cigars daily.  No wheezing or desaturation noted on exam. - hold dulera while NPO, resume when appropriate  Hx of seizure disorder. - Noted on previous hospital followup in December.  He takes keppra 500 mg BID at home.  - Keppra 500mg  BID IV while NPO.   HTN - noted in problem list.  Patient normotensive on admission.  Will do permissive HTN.  Patient is on Coreg at home --PRN hydralazine for systolic greater than 867 and diastolic greater than 619.  Given patient recently use of crack cocaine will avoid beta-blocker.  GERD - patient takes pantoprazole 40mg  at home.   - hold protonix while NPO  Gout - patient takes allopurinol 300mg  at home.   - hold allopurinol while NPO  CHF - 10/2017 Echo: LVEF 55-60%.  G1DD.  Mild aortic stenosis. Patient takes carvedilol 3.125mg  BID.  Mild right lower extremity swelling noted on exam. - hold carvedilol while NPO, discussed prior to resuming carvedilol given recent crack use. --Follow-up for repeat echo  Hx of DVT - DVT listed in problem list.  Is not on anticoagulation currently because he is a fall risk.  Patient is currently on Plavix and aspirin which is increases risk of bleeding.  He has a history of proximal atrial fibrillation with elevated chads-VASC score.   Alcohol abuse - patient states he only drinks one beer a month.  Will keep him on CIWA with ativan if needed.   - monitor CIWA scores for withdrawal.   - EtOH level and UDS  Tobacco use disorder -  Patient smokes ten cigars daily, though he states they are small cigars.   - nicotine patch as needed.   Substance use disorder - patient smoked crack  cocaine yesterday.   - consider social work consult  - f/u UDS  Dementia - likely vascular dementia from his history of CVA. Patient takes namenda at home.    - hold namenda while npo.     FEN/GI: NPO.   Prophylaxis: lovenox  Disposition: med tele  History of Present Illness:  [name] is a 68 y.o. male presenting with left sided weakness for one day.    He states his left leg started to go numb yesterday evening around 6pm.  Shortly after it became weak and he was unable to move it.  His left arm has had paralysis from one of his previous strokes.  He states he thinks he has had at least seven strokes so far.  The left side of his face also went numb yesterday at about the same time.  He also reported having a headache yesterday evening lasting a few seconds.  He recently had a stroke in December which also had left sided weakness.  He went to a skilled rehab facility afterwards and got to the point where he could walk around on his own with the use of a cane, although he used a motorized wheelchair more than he walked.  He left the rehab facility at the end of January and had a week of nausea vomiting and diarrhea right after he came home.  This has since resolved.  He also complains of 'chest pain'  In the left subcostal region every time he breathes.   The patient lives alone, he smokes ten cigars a day (small cigars), drinks 'about once a month' and smokes crack cocaine.  He last smoked crack yesterday before his leg started to go numb.  He does not believe the two events are related.    Review Of Systems: Per HPI with the following additions:   Review of Systems  Constitutional: Negative for chills.  Eyes: Positive for double vision.  Respiratory: Positive for cough and sputum production. Negative for hemoptysis.   Cardiovascular: Positive for chest pain and leg swelling.  Gastrointestinal: Positive for heartburn.  Skin: Positive for rash.  Neurological: Positive for  sensory change, focal weakness and headaches.  Psychiatric/Behavioral: Positive for substance abuse.    Patient Active Problem List   Diagnosis Date Noted  . Dementia due to another medical condition, without behavioral disturbance ([place])   . Acute blood loss anemia   . Hypoalbuminemia due to protein-calorie malnutrition ([place])   . Pruritus   . Labile blood pressure   . Diabetes mellitus type 2 in obese ([place])   . Seizures ([place])   . Right middle cerebral artery stroke ([place]) 01/18/2018  . AMS (altered mental status) 11/29/2017  . Acute encephalopathy 11/28/2017  . Left shoulder pain 09/28/2017  . Cerebral thrombosis with cerebral infarction 07/18/2016  . Acute ischemic stroke ([place])   . Left hemiplegia ([place])   . Stroke-like symptoms 07/17/2016  . Insomnia 01/29/2016  . Depression 01/29/2016  . Alcohol abuse with intoxication ([place]) 10/16/2015  .  Psoriasiform dermatitis 07/12/2015  . Dementia due to another medical condition ([place]) 07/12/2015  . Urinary incontinence 03/14/2015  . Type 2 diabetes mellitus with peripheral neuropathy ([place]) 03/13/2015  . Hemiparesis affecting left side as late effect of cerebrovascular accident ([place]) 02/18/2015  . Cerebrovascular accident (CVA) due to stenosis of right carotid artery ([place])   . Coronary artery disease involving native coronary artery of native heart without angina pectoris   . Tachypnea   . Thrombocytopenia ([place])   . History of stroke   . Stroke (cerebrum) ([place]) 02/06/2015  . Arterial ischemic stroke, MCA (middle cerebral artery), right, acute ([place])   . DOE (dyspnea on exertion) 11/23/2014  . PAF (paroxysmal atrial fibrillation) ([place]) 11/23/2014  . HLD (hyperlipidemia) 11/01/2014  . Tobacco use disorder 11/01/2014  . Diastolic CHF, acute on chronic (HCC) 11/01/2014  . Cerebral infarction due to stenosis of right carotid artery ([place]) 11/01/2014  . Carotid stenosis 11/01/2014  . Hyperkalemia 10/24/2014  . H/O total knee replacement  10/24/2014  . Essential hypertension   . Polysubstance abuse ([place]) 08/28/2014  . Cocaine abuse ([place])   . Intracranial carotid stenosis   . Headache around the eyes 08/27/2014  . Acute CVA (cerebrovascular accident) ([place]) 08/27/2014  . CVA (cerebral vascular accident) ([place]) 08/26/2014  . History of DVT (deep vein thrombosis)   . Alcoholism ([place])   . Embolic stroke ([place])   . Cerebral infarction due to embolism of right middle cerebral artery ([place])   . Hypotension   . AKI (acute kidney injury) ([place])   . Stroke ([place])   . CVA (cerebral infarction) 08/01/2014  . Alcohol dependence with withdrawal with complication ([place]) 52/84/1324  . DVT (deep venous thrombosis) ([place]) 02/22/2014  . Lower extremity edema 02/21/2014  . Chronic diastolic congestive heart failure ([place]) 11/21/2013  . CAD (coronary artery disease) 11/21/2013  . Alcohol abuse 11/21/2013  . GERD (gastroesophageal reflux disease) 08/10/2013  . Gout 08/10/2013  . Tobacco abuse 07/26/2013  . Trichiasis without entropion 04/16/2013  . Dizziness 04/10/2013  . Eyelid lesion 01/24/2013  . Enophthalmos due to trauma 01/19/2013  . Medial orbital wall fracture 01/19/2013  . Closed blow-out fracture of floor of orbit ([place]) 01/19/2013  . Binocular vision disorder with diplopia 01/03/2013  . Fracture of inferior orbital wall ([place]) 01/03/2013  . Fracture of orbital floor ([place]) 01/03/2013  . Pain of left eye 01/03/2013  . Peripheral vascular disease ([place]) 07/27/2012  . COPD (chronic obstructive pulmonary disease) ([place]) 04/04/2012  . HTN (hypertension) 04/04/2012    Past Medical History: Past Medical History:  Diagnosis Date  . Alcohol abuse    H/O  . Anxiety   . Arthritis   . Asthma   . CAD (coronary artery disease) 05/27/10   Cath: severe single vessell CAD left cx midportion obtuse marginal 2 to 3.  [name] CHF (congestive heart failure) ([place])   . COPD (chronic obstructive pulmonary disease) ([place])   . COPD (chronic obstructive  pulmonary disease) ([place])   . Depression   . Diabetes mellitus, type 2 ([place]) 03/13/2015  . GERD (gastroesophageal reflux disease)   . HCAP (healthcare-associated pneumonia) 10/15/2015  . History of DVT of lower extremity   . Hyperlipemia   . Hyperlipidemia   . Hypertension   . Myocardial infarction ([place]) 2000  . Orbital fracture 12/2012  . OSA on CPAP   . Peripheral vascular disease, unspecified ([place])    08/20/10 doppler: increase in right ABI post-op. Left ABI stable. S/P bi-fem bypass surgery  . Pneumonia  04/04/2012  . Shortness of breath   . Stroke ([place])   . Tobacco abuse     Past Surgical History: Past Surgical History:  Procedure Laterality Date  . abdoninal ao angio & bifem angio  05/27/10   Patent graft, occluded bil stents with no retrograde flow into the hypogastric arteries. 100% occl left ant. tibial artery, 70% to 80% to 100% stenosis right superficial fem artery above adductor canal. 100 % occl right ant. tibial vessell  . Aortogram w/ PTCA  09/292003  . BACK SURGERY    . CARDIAC CATHETERIZATION  05/27/10   severe CAD left cx  . CHOLECYSTECTOMY    . ESOPHAGOGASTRIC FUNDOPLICATION    . EYE SURGERY    . FEMORAL BYPASS  08/19/10   Right Fem-Pop  . IR ANGIO INTRA EXTRACRAN SEL COM CAROTID INNOMINATE BILAT MOD SED  07/20/2016  . IR ANGIO INTRA EXTRACRAN SEL COM CAROTID INNOMINATE BILAT MOD SED  01/14/2018  . IR ANGIO VERTEBRAL SEL SUBCLAVIAN INNOMINATE UNI R MOD SED  07/20/2016  . IR ANGIO VERTEBRAL SEL VERTEBRAL BILAT MOD SED  01/14/2018  . IR ANGIO VERTEBRAL SEL VERTEBRAL UNI L MOD SED  07/20/2016  . IR RADIOLOGIST EVAL & MGMT  05/27/2016  . IR US GUIDE VASC ACCESS RIGHT  01/14/2018  . JOINT REPLACEMENT    . PERIPHERAL VASCULAR CATHETERIZATION N/A 12/10/2014   Procedure: Abdominal Aortogram;  Surgeon: Angelia Mould, MD;  Location: [facility];  Service: Cardiovascular;  Laterality: N/A;  . RADIOLOGY WITH ANESTHESIA N/A 02/08/2015   Procedure: RADIOLOGY WITH  ANESTHESIA;  Surgeon: Luanne Bras, MD;  Location: [facility];  Service: Radiology;  Laterality: N/A;  . TEE WITHOUT CARDIOVERSION N/A 08/29/2014   Procedure: TRANSESOPHAGEAL ECHOCARDIOGRAM (TEE);  Surgeon: Josue Hector, MD;  Location: [facility];  Service: Cardiovascular;  Laterality: N/A;  . TOTAL KNEE ARTHROPLASTY      Social History: Social History   Tobacco Use  . Smoking status: Current Every Day Smoker    Packs/day: 0.50    Years: 50.00    Pack years: 25.00    Types: Cigars, Cigarettes    Start date: 02/09/1961  . Smokeless tobacco: Former Systems developer    Types: Chew  . Tobacco comment: 2 ppd, full flavor  Substance Use Topics  . Alcohol use: Yes    Comment: occasionally, "I might drink 1 every 6 months"  . Drug use: Yes    Types: Cocaine    Comment: "I smoke crack every once in a while"; last smoked yesterday   Additional social history: Please also refer to relevant sections of EMR.  Family History: Family History  Problem Relation Age of Onset  . Heart attack Mother   . Heart failure Mother   . Cirrhosis Father   . Heart failure Brother   . Cancer Brother   . Hypertension Neg Hx        UNKNOWN  . Stroke Neg Hx        UNKNOWN    Allergies and Medications: Allergies  Allergen Reactions  . Darvocet [Propoxyphene N-Acetaminophen] Hives  . Haldol [Haloperidol Decanoate] Hives  . Acetaminophen Nausea Only    Upset stomach, tolerates Hydrocodone/APAP if taken with food  . Metformin And Related Nausea And Vomiting  . Norco [Hydrocodone-Acetaminophen] Nausea And Vomiting    Tolerates if taken with food   No current facility-administered medications on file prior to encounter.    Current Outpatient Medications on File Prior to Encounter  Medication Sig Dispense Refill  .  allopurinol (ZYLOPRIM) 300 MG tablet Take 1 tablet (300 mg total) by mouth daily. 30 tablet 3  . atorvastatin (LIPITOR) 80 MG tablet Take 1 tablet (80 mg total) by mouth daily at 6 PM. 30 tablet 3   . baclofen (LIORESAL) 10 MG tablet Take 1 tablet (10 mg total) by mouth at bedtime. 30 each 0  . carvedilol (COREG) 3.125 MG tablet Take 1 tablet (3.125 mg total) by mouth 2 (two) times daily with a meal.    . clopidogrel (PLAVIX) 75 MG tablet Take 1 tablet (75 mg total) by mouth daily. 30 tablet 3  . diphenhydramine-acetaminophen (TYLENOL PM) 25-500 MG TABS tablet Take 1 tablet by mouth at bedtime as needed.    . DULoxetine (CYMBALTA) 60 MG capsule Take 1 capsule (60 mg total) by mouth daily. 30 capsule 3  . folic acid (FOLVITE) 1 MG tablet Take 1 tablet (1 mg total) by mouth daily.    . hydrOXYzine (ATARAX/VISTARIL) 25 MG tablet Take 1 tablet (25 mg total) by mouth every 8 (eight) hours as needed for itching. 30 tablet 0  . levETIRAcetam (KEPPRA) 500 MG tablet Take 1 tablet (500 mg total) by mouth 2 (two) times daily. 60 tablet 3  . memantine (NAMENDA) 10 MG tablet Take 1 tablet (10 mg total) by mouth 2 (two) times daily. 6 tablet 0  . mometasone-formoterol (DULERA) 200-5 MCG/ACT AERO Inhale 2 puffs into the lungs 2 (two) times daily.    . pantoprazole (PROTONIX) 40 MG tablet Take 1 tablet (40 mg total) by mouth daily. 30 tablet 3  . pregabalin (LYRICA) 150 MG capsule Take 1 capsule (150 mg total) by mouth 2 (two) times daily. 180 capsule 0  . WIXELA INHUB 250-50 MCG/DOSE AEPB Inhale 1-2 puffs into the lungs daily.    [name] acetaminophen (TYLENOL) 325 MG tablet Take 2 tablets (650 mg total) by mouth every 4 (four) hours as needed for mild pain (or temp > 37.5 C (99.5 F)). (Patient not taking: Reported on 03/25/2018)    . aspirin 325 MG tablet Take 1 tablet (325 mg total) by mouth daily. (Patient not taking: Reported on 03/25/2018)    . nicotine (NICODERM CQ - DOSED IN MG/24 HOURS) 21 mg/24hr patch Place 1 patch (21 mg total) onto the skin daily. (Patient not taking: Reported on 03/25/2018) 28 patch 0    Objective: Temp 97.8 F (36.6 C) (Oral)   Ht 6' (1.829 m)   Wt 106.6 kg   BMI 31.87 kg/m   Exam: General: alert, oriented.  Laying in bed.  Eyes: PERRL, EOMI. No scleral icterus. Extremely diminished peripheral vision globally.   ENTM: no oropharyngeal erythema.  Patient unable to fully open his mouth. Unable to visualize uvula.  Neck: no cervical LAD.   Cardiovascular: regular rhythm. Normal rate. 2+ radial pulses.  Left sided DP pulse is faint.  Right DP pulse normal.  No murmurs.  2+ pitting edema of the feet and ankles.  Left side greater than right.  Respiratory: rhonchi heard bilaterally.  Diffusely course throughout inspiration and expiration. No tachypnea. No increased WOB.   Gastrointestinal: TTP on LUQ.  Bowel sounds reduced.  Abdomen distended.   Derm: scattered petechiae on legs bilaterally MSK: 5/5 right upper extremity.  4/5 right lower extremity.  2/5 left lower extremity.  0/5 left upper extremity.  Left foreleg tender to palpation. Feet are cool to the touch.  Right lower leg mild edema.  No erythema or warmth. Neuro: paralysis of left sided facial muscles.  Dysarthric. Sensation intact bilaterally in upper and lower extremities. 2+ knee jerk and brachial reflexes both sides. Patient able to move left leg spontaneously at end of physical exam.   Psych: pleasant affect. Able to have conversation.  No signs of receptive or expressive aphasia.   Labs and Imaging: CBC BMET  Recent Labs  Lab 03/25/18 1448  WBC 6.2  HGB 11.3*  HCT 39.3  PLT 223   Recent Labs  Lab 03/25/18 1448 03/25/18 1453  NA 144  --   K 4.2  --   CL 110  --   CO2 25  --   BUN 11  --   CREATININE 0.92 0.80  GLUCOSE 88  --   CALCIUM 9.7  --      Ct Angio Head W Or Wo Contrast  Result Date: 03/25/2018 CLINICAL DATA:  68 year old male code stroke presentation with left side paralysis. Occluded right ICA and right frontal lobe infarct in December. EXAM: CT ANGIOGRAPHY HEAD AND NECK TECHNIQUE: Multidetector CT imaging of the head and neck was performed using the standard protocol during bolus  administration of intravenous contrast. Multiplanar CT image reconstructions and MIPs were obtained to evaluate the vascular anatomy. Carotid stenosis measurements (when applicable) are obtained utilizing NASCET criteria, using the distal internal carotid diameter as the denominator. CONTRAST:  96mL ISOVUE-370 IOPAMIDOL (ISOVUE-370) INJECTION 76% COMPARISON:  Plain head CT earlier today. Cerebral angiogram 01/14/2018. brain MRI 01/13/2018. CTA 10/25/2017. FINDINGS: CT HEAD Brain: Stable gray-white matter differentiation throughout the brain. Patchy right MCA hypodensity appears stable from earlier today. No acute intracranial hemorrhage identified. No midline shift, mass effect, or evidence of intracranial mass lesion. No ventriculomegaly. Calvarium and skull base: Stable. Paranasal sinuses: Chronic left maxillary sinusitis. Orbits: No acute finding.  Previous left orbital floor ORIF. CTA NECK Skeleton: Absent dentition. C2-C3 ankylosis versus incomplete segmentation. No acute osseous abnormality identified. Upper chest: Stable since September and negative. Other neck: Stable and negative. Aortic arch: Extensive Calcified aortic atherosclerosis. Three vessel arch configuration. Right carotid system: Bulky plaque in the brachiocephalic artery appears stable the right CCA origin remains patent. The right CCA is patent to the level of the right ICA stent, but the stent is occluded as before. No reconstituted flow in the cervical right ICA. Left carotid system: Soft and calcified left CCA origin plaque appears stable with less than 50 % stenosis with respect to the distal vessel. Multifocal calcified plaque at the left carotid bifurcation and continuing to the left ICA bulb with less than 50 % stenosis with respect to the distal vessel. The left ICA remains patent to the skull base. Vertebral arteries: The right subclavian artery origin remains patent despite plaque. Stable calcified plaque at the right vertebral artery  origin resulting in mild to moderate stenosis. Stable right V1 and V3 calcified plaque with mild stenosis. The right vertebral is non dominant and patent to the skull base. Stable proximal left subclavian artery plaque without significant stenosis. The left vertebral artery is dominant. Calcified plaque at the left vertebral artery origin results in mild stenosis and appears stable. (Series 10, image 111). Intermittent left V2 and V3 calcified plaque with mild stenosis is stable. The left vertebral remains patent to the skull base, and the left PICA origin is early and patent. CTA HEAD Posterior circulation: Fairly codominant distal vertebral arteries without stenosis. Patent PICA origins and vertebrobasilar junction. Patent basilar artery without stenosis. Normal basilar tip, SCA and PCA origins. Posterior communicating arteries are diminutive or absent. Bilateral PCA  branches are normal aside from mild irregularity which is greater on the left. Anterior circulation: The right ICA siphon is reconstituted distally and stable in appearance since September 2019. There is a distal siphon stent which is patent. The right ACA and MCA origins are patent without stenosis, but there is asymmetrically decreased right ACA and MCA enhancement. Still, the right MCA bifurcation is patent and right MCA branches appear stable since September. The anterior communicating artery is normal, but the right ACA remains hypoenhancing compared to the left. No ACA branch occlusion identified. Left ICA siphon is patent with moderate calcified plaque resulting in mild left siphon stenosis. Normal left ICA terminus, left MCA and ACA origin. Normal left A1. Left MCA M1, bifurcation, and left MCA branches are within normal limits. Venous sinuses: Not evaluated due to early contrast timing. Anatomic variants: Dominant left vertebral artery. Review of the MIP images confirms the above findings IMPRESSION: 1. Negative for emergent large vessel  occlusion. 2. Stable occluded cervical Right ICA stent and suboptimal reconstitution of the distal Right ICA terminus. The right ACA and MCA remain patent although with decreased enhancement compared to the left anterior circulation. 3. Left carotid, left anterior circulation and posterior circulation are stable since September. Notable for: - Left carotid calcified plaque without significant stenosis. - multifocal bilateral vertebral artery plaque with mild to moderate right and mild left vertebral artery stenosis. 4. Stable non contrast CT appearance of the brain since 1456 hours today. 5.  Aortic Atherosclerosis (ICD10-I70.0). Salient findings were communicated to Dr. Lorraine Lax at 3:34 pm on 03/25/2018 by text page via the [facility] messaging system. Electronically Signed   By: Genevie Ann M.D.   On: 03/25/2018 15:35   Ct Angio Neck W Or Wo Contrast  Result Date: 03/25/2018 CLINICAL DATA:  68 year old male code stroke presentation with left side paralysis. Occluded right ICA and right frontal lobe infarct in December. EXAM: CT ANGIOGRAPHY HEAD AND NECK TECHNIQUE: Multidetector CT imaging of the head and neck was performed using the standard protocol during bolus administration of intravenous contrast. Multiplanar CT image reconstructions and MIPs were obtained to evaluate the vascular anatomy. Carotid stenosis measurements (when applicable) are obtained utilizing NASCET criteria, using the distal internal carotid diameter as the denominator. CONTRAST:  25mL ISOVUE-370 IOPAMIDOL (ISOVUE-370) INJECTION 76% COMPARISON:  Plain head CT earlier today. Cerebral angiogram 01/14/2018. brain MRI 01/13/2018. CTA 10/25/2017. FINDINGS: CT HEAD Brain: Stable gray-white matter differentiation throughout the brain. Patchy right MCA hypodensity appears stable from earlier today. No acute intracranial hemorrhage identified. No midline shift, mass effect, or evidence of intracranial mass lesion. No ventriculomegaly. Calvarium and skull  base: Stable. Paranasal sinuses: Chronic left maxillary sinusitis. Orbits: No acute finding.  Previous left orbital floor ORIF. CTA NECK Skeleton: Absent dentition. C2-C3 ankylosis versus incomplete segmentation. No acute osseous abnormality identified. Upper chest: Stable since September and negative. Other neck: Stable and negative. Aortic arch: Extensive Calcified aortic atherosclerosis. Three vessel arch configuration. Right carotid system: Bulky plaque in the brachiocephalic artery appears stable the right CCA origin remains patent. The right CCA is patent to the level of the right ICA stent, but the stent is occluded as before. No reconstituted flow in the cervical right ICA. Left carotid system: Soft and calcified left CCA origin plaque appears stable with less than 50 % stenosis with respect to the distal vessel. Multifocal calcified plaque at the left carotid bifurcation and continuing to the left ICA bulb with less than 50 % stenosis with respect to the distal  vessel. The left ICA remains patent to the skull base. Vertebral arteries: The right subclavian artery origin remains patent despite plaque. Stable calcified plaque at the right vertebral artery origin resulting in mild to moderate stenosis. Stable right V1 and V3 calcified plaque with mild stenosis. The right vertebral is non dominant and patent to the skull base. Stable proximal left subclavian artery plaque without significant stenosis. The left vertebral artery is dominant. Calcified plaque at the left vertebral artery origin results in mild stenosis and appears stable. (Series 10, image 111). Intermittent left V2 and V3 calcified plaque with mild stenosis is stable. The left vertebral remains patent to the skull base, and the left PICA origin is early and patent. CTA HEAD Posterior circulation: Fairly codominant distal vertebral arteries without stenosis. Patent PICA origins and vertebrobasilar junction. Patent basilar artery without stenosis.  Normal basilar tip, SCA and PCA origins. Posterior communicating arteries are diminutive or absent. Bilateral PCA branches are normal aside from mild irregularity which is greater on the left. Anterior circulation: The right ICA siphon is reconstituted distally and stable in appearance since September 2019. There is a distal siphon stent which is patent. The right ACA and MCA origins are patent without stenosis, but there is asymmetrically decreased right ACA and MCA enhancement. Still, the right MCA bifurcation is patent and right MCA branches appear stable since September. The anterior communicating artery is normal, but the right ACA remains hypoenhancing compared to the left. No ACA branch occlusion identified. Left ICA siphon is patent with moderate calcified plaque resulting in mild left siphon stenosis. Normal left ICA terminus, left MCA and ACA origin. Normal left A1. Left MCA M1, bifurcation, and left MCA branches are within normal limits. Venous sinuses: Not evaluated due to early contrast timing. Anatomic variants: Dominant left vertebral artery. Review of the MIP images confirms the above findings IMPRESSION: 1. Negative for emergent large vessel occlusion. 2. Stable occluded cervical Right ICA stent and suboptimal reconstitution of the distal Right ICA terminus. The right ACA and MCA remain patent although with decreased enhancement compared to the left anterior circulation. 3. Left carotid, left anterior circulation and posterior circulation are stable since September. Notable for: - Left carotid calcified plaque without significant stenosis. - multifocal bilateral vertebral artery plaque with mild to moderate right and mild left vertebral artery stenosis. 4. Stable non contrast CT appearance of the brain since 1456 hours today. 5.  Aortic Atherosclerosis (ICD10-I70.0). Salient findings were communicated to Dr. Lorraine Lax at 3:34 pm on 03/25/2018 by text page via the [facility] messaging system. Electronically  Signed   By: Genevie Ann M.D.   On: 03/25/2018 15:35   Mr Brain Wo Contrast (neuro Protocol)  Result Date: 03/25/2018 CLINICAL DATA:  Left arm weakness and facial droop. EXAM: MRI HEAD WITHOUT CONTRAST TECHNIQUE: Multiplanar, multiecho pulse sequences of the brain and surrounding structures were obtained without intravenous contrast. COMPARISON:  Head CT and CTA 03/25/2018 FINDINGS: BRAIN: There is abnormal diffusion restriction predominantly within the right anterior cerebral artery territory, with small foci of diffusion abnormality within the anterior portion of the right middle cerebral artery territory. The midline structures are normal. No midline shift or other mass effect. Confluent hyperintense T2-weighted signal within the right MCA corona radiata. There is an old posterior right parietal infarct. Multifocal white matter hyperintensity bilaterally otherwise. There is hyperintense T2-weighted signal also within the right midbrain and pons. There is generalized atrophy. Susceptibility-sensitive sequences show no chronic microhemorrhage or superficial siderosis. VASCULAR: Loss of the normal right  ICA flow void. SKULL AND UPPER CERVICAL SPINE: Calvarial bone marrow signal is normal. There is no skull base mass. Visualized upper cervical spine and soft tissues are normal. SINUSES/ORBITS: Moderate left maxillary mucosal thickening. There are bilateral lens replacements. IMPRESSION: 1. Acute ischemia within the anterior right hemisphere, predominantly within the right ACA territory, with small foci also present in the MCA territory. 2. Loss of the normal right ICA flow void, consistent with known occlusion. 3. No hemorrhage or mass effect. 4. 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 Electronically Signed   By: Ulyses Jarred M.D.   On: 03/25/2018 18:37   Ct Head Code Stroke Wo Contrast  Result Date: 03/25/2018 CLINICAL DATA:  Code stroke.  Left sided paralysis. EXAM: CT HEAD WITHOUT CONTRAST  TECHNIQUE: Contiguous axial images were obtained from the base of the skull through the vertex without intravenous contrast. COMPARISON:  01/12/2018 FINDINGS: Brain: Brainstem and cerebellum remain unremarkable. Left hemisphere shows atrophy with chronic small-vessel ischemic changes. Right hemisphere shows progressive right frontal cortical and subcortical low-density related to a regional infarction that was acute in December of 2019. Older right posterior frontal cortical and subcortical infarction appears the same. Chronic small-vessel ischemic changes appear the same. No sign of hyperacute insult. No hemorrhage, hydrocephalus or extra-axial collection. Vascular: There is atherosclerotic calcification of the major vessels at the base of the brain. Right carotid stent. Skull: No acute finding.  Previous left orbital reconstruction. Sinuses/Orbits: Clear except for chronic mucosal thickening of the left maxillary sinus. Orbits negative. Other: None ASPECTS ([place] Stroke Program Early CT Score) is problematic given the late subacute right frontal infarction. Allowing for that, no acute ischemia is identified. IMPRESSION: 1. No acute finding today. Late subacute infarction in the right frontal cortical and subcortical brain which was acute in December of 2019. Older right posterior frontal cortical and subcortical infarction. 2. ASPECTS is problematic because of the subacute right frontal infarction. 3. These results were communicated to Dr. Lorraine Lax at 3:12 pmon 2/14/2020by text page via the [facility] messaging system. Electronically Signed   By: Nelson Chimes M.D.   On: 03/25/2018 15:13   I have seen and evaluated the patient with Dr. Jeannine Kitten. I am in agreement with the note above in its revised form. My additions are in blue.  Marjie Skiff, MD Family Medicine, PGY-3  Benay Pike, MD 03/25/2018, 4:17 PM PGY-1, [place] [facility] pager: [phone], text pages welcome

## 2018-03-26 ENCOUNTER — Inpatient Hospital Stay (HOSPITAL_COMMUNITY): Payer: Medicare Other

## 2018-03-26 ENCOUNTER — Encounter (HOSPITAL_COMMUNITY): Payer: Self-pay | Admitting: Family Medicine

## 2018-03-26 DIAGNOSIS — R1012 Left upper quadrant pain: Secondary | ICD-10-CM

## 2018-03-26 DIAGNOSIS — I480 Paroxysmal atrial fibrillation: Secondary | ICD-10-CM

## 2018-03-26 DIAGNOSIS — Z8673 Personal history of transient ischemic attack (TIA), and cerebral infarction without residual deficits: Secondary | ICD-10-CM

## 2018-03-26 DIAGNOSIS — F1410 Cocaine abuse, uncomplicated: Secondary | ICD-10-CM

## 2018-03-26 DIAGNOSIS — F1910 Other psychoactive substance abuse, uncomplicated: Secondary | ICD-10-CM

## 2018-03-26 DIAGNOSIS — J449 Chronic obstructive pulmonary disease, unspecified: Secondary | ICD-10-CM

## 2018-03-26 DIAGNOSIS — F17200 Nicotine dependence, unspecified, uncomplicated: Secondary | ICD-10-CM

## 2018-03-26 DIAGNOSIS — E7800 Pure hypercholesterolemia, unspecified: Secondary | ICD-10-CM

## 2018-03-26 DIAGNOSIS — I6521 Occlusion and stenosis of right carotid artery: Secondary | ICD-10-CM

## 2018-03-26 DIAGNOSIS — I10 Essential (primary) hypertension: Secondary | ICD-10-CM

## 2018-03-26 DIAGNOSIS — D649 Anemia, unspecified: Secondary | ICD-10-CM

## 2018-03-26 DIAGNOSIS — G8194 Hemiplegia, unspecified affecting left nondominant side: Secondary | ICD-10-CM

## 2018-03-26 DIAGNOSIS — I69354 Hemiplegia and hemiparesis following cerebral infarction affecting left non-dominant side: Secondary | ICD-10-CM

## 2018-03-26 DIAGNOSIS — F0280 Dementia in other diseases classified elsewhere without behavioral disturbance: Secondary | ICD-10-CM

## 2018-03-26 DIAGNOSIS — R471 Dysarthria and anarthria: Secondary | ICD-10-CM

## 2018-03-26 DIAGNOSIS — F1020 Alcohol dependence, uncomplicated: Secondary | ICD-10-CM

## 2018-03-26 DIAGNOSIS — I2510 Atherosclerotic heart disease of native coronary artery without angina pectoris: Secondary | ICD-10-CM

## 2018-03-26 DIAGNOSIS — E1142 Type 2 diabetes mellitus with diabetic polyneuropathy: Secondary | ICD-10-CM

## 2018-03-26 DIAGNOSIS — R109 Unspecified abdominal pain: Secondary | ICD-10-CM

## 2018-03-26 LAB — URINALYSIS, ROUTINE W REFLEX MICROSCOPIC
BILIRUBIN URINE: NEGATIVE
Glucose, UA: NEGATIVE mg/dL
Hgb urine dipstick: NEGATIVE
Ketones, ur: NEGATIVE mg/dL
Leukocytes,Ua: NEGATIVE
Nitrite: NEGATIVE
PH: 7 (ref 5.0–8.0)
Protein, ur: NEGATIVE mg/dL
Specific Gravity, Urine: 1.009 (ref 1.005–1.030)

## 2018-03-26 LAB — CBC WITH DIFFERENTIAL/PLATELET
Abs Immature Granulocytes: 0.02 10*3/uL (ref 0.00–0.07)
Basophils Absolute: 0.1 10*3/uL (ref 0.0–0.1)
Basophils Relative: 1 %
Eosinophils Absolute: 0.4 10*3/uL (ref 0.0–0.5)
Eosinophils Relative: 7 %
HCT: 34.7 % — ABNORMAL LOW (ref 39.0–52.0)
Hemoglobin: 9.9 g/dL — ABNORMAL LOW (ref 13.0–17.0)
IMMATURE GRANULOCYTES: 0 %
Lymphocytes Relative: 28 %
Lymphs Abs: 1.4 10*3/uL (ref 0.7–4.0)
MCH: 24.2 pg — ABNORMAL LOW (ref 26.0–34.0)
MCHC: 28.5 g/dL — ABNORMAL LOW (ref 30.0–36.0)
MCV: 84.8 fL (ref 80.0–100.0)
Monocytes Absolute: 0.7 10*3/uL (ref 0.1–1.0)
Monocytes Relative: 13 %
NEUTROS PCT: 51 %
NRBC: 0 % (ref 0.0–0.2)
Neutro Abs: 2.6 10*3/uL (ref 1.7–7.7)
PLATELETS: 183 10*3/uL (ref 150–400)
RBC: 4.09 MIL/uL — ABNORMAL LOW (ref 4.22–5.81)
RDW: 14.4 % (ref 11.5–15.5)
WBC: 5 10*3/uL (ref 4.0–10.5)

## 2018-03-26 LAB — RAPID URINE DRUG SCREEN, HOSP PERFORMED
Amphetamines: NOT DETECTED
Barbiturates: NOT DETECTED
Benzodiazepines: NOT DETECTED
Cocaine: POSITIVE — AB
Opiates: NOT DETECTED
TETRAHYDROCANNABINOL: NOT DETECTED

## 2018-03-26 LAB — BASIC METABOLIC PANEL
Anion gap: 6 (ref 5–15)
BUN: 8 mg/dL (ref 8–23)
CO2: 26 mmol/L (ref 22–32)
Calcium: 9 mg/dL (ref 8.9–10.3)
Chloride: 110 mmol/L (ref 98–111)
Creatinine, Ser: 0.62 mg/dL (ref 0.61–1.24)
GFR calc Af Amer: >60 mL/min (ref >60)
Glucose, Bld: 97 mg/dL (ref 70–99)
Potassium: 3.9 mmol/L (ref 3.5–5.1)
Sodium: 142 mmol/L (ref 135–145)

## 2018-03-26 LAB — LIPASE, BLOOD: Lipase: 22 U/L (ref 11–51)

## 2018-03-26 LAB — LIPID PANEL
Cholesterol: 127 mg/dL (ref 0–200)
HDL: 31 mg/dL — ABNORMAL LOW (ref >40)
LDL Cholesterol: 77 mg/dL (ref 0–99)
Total CHOL/HDL Ratio: 4.1 RATIO
Triglycerides: 93 mg/dL (ref <150)
VLDL: 19 mg/dL (ref 0–40)

## 2018-03-26 MED ORDER — POLYETHYLENE GLYCOL 3350 17 G PO PACK
17.0000 g | PACK | Freq: Every day | ORAL | Status: DC
  Administered 2018-03-26 – 2018-04-01 (×5): 17 g via ORAL
  Filled 2018-03-26 (×6): qty 1

## 2018-03-26 MED ORDER — NICOTINE 21 MG/24HR TD PT24
21.0000 mg | MEDICATED_PATCH | Freq: Every day | TRANSDERMAL | Status: DC
  Administered 2018-03-26 – 2018-04-01 (×7): 21 mg via TRANSDERMAL
  Filled 2018-03-26 (×7): qty 1

## 2018-03-26 MED ORDER — ASPIRIN 325 MG PO TABS
325.0000 mg | ORAL_TABLET | Freq: Every day | ORAL | Status: DC
  Administered 2018-03-26 – 2018-04-01 (×7): 325 mg via ORAL
  Filled 2018-03-26 (×7): qty 1

## 2018-03-26 MED ORDER — CLOPIDOGREL BISULFATE 75 MG PO TABS
75.0000 mg | ORAL_TABLET | Freq: Every day | ORAL | Status: DC
  Administered 2018-03-26 – 2018-04-01 (×7): 75 mg via ORAL
  Filled 2018-03-26 (×7): qty 1

## 2018-03-26 MED ORDER — SENNA 8.6 MG PO TABS
2.0000 | ORAL_TABLET | Freq: Every day | ORAL | Status: DC
  Administered 2018-03-26 – 2018-03-31 (×6): 17.2 mg via ORAL
  Filled 2018-03-26 (×6): qty 2

## 2018-03-26 MED ORDER — HYDROXYZINE HCL 25 MG PO TABS
25.0000 mg | ORAL_TABLET | Freq: Three times a day (TID) | ORAL | Status: DC | PRN
  Administered 2018-03-27 – 2018-03-28 (×2): 25 mg via ORAL
  Filled 2018-03-26 (×2): qty 1

## 2018-03-26 MED ORDER — FOLIC ACID 1 MG PO TABS
1.0000 mg | ORAL_TABLET | Freq: Every day | ORAL | Status: DC
  Administered 2018-03-26 – 2018-04-01 (×7): 1 mg via ORAL
  Filled 2018-03-26 (×7): qty 1

## 2018-03-26 MED ORDER — BACLOFEN 10 MG PO TABS
10.0000 mg | ORAL_TABLET | Freq: Every day | ORAL | Status: DC
  Administered 2018-03-26 – 2018-03-31 (×6): 10 mg via ORAL
  Filled 2018-03-26 (×6): qty 1

## 2018-03-26 MED ORDER — PREGABALIN 75 MG PO CAPS
150.0000 mg | ORAL_CAPSULE | Freq: Two times a day (BID) | ORAL | Status: DC
  Administered 2018-03-26 – 2018-04-01 (×13): 150 mg via ORAL
  Filled 2018-03-26 (×13): qty 2

## 2018-03-26 MED ORDER — CARVEDILOL 3.125 MG PO TABS
3.1250 mg | ORAL_TABLET | Freq: Two times a day (BID) | ORAL | Status: DC
  Administered 2018-03-26 – 2018-03-30 (×9): 3.125 mg via ORAL
  Filled 2018-03-26 (×9): qty 1

## 2018-03-26 NOTE — ED Provider Notes (Signed)
[facility] PROGRESSIVE CARE Provider Note   CSN: [id number] Arrival date & time: 03/25/18  1445   An emergency department physician performed an initial assessment on this suspected stroke patient at 1446.  History   Chief Complaint Chief Complaint  Patient presents with  . Code Stroke    HPI [name] is a 68 y.o. male.  68yo M w/ PMH including CVA w/ L arm weakness, CHF, COPD, CAD, OSA, PVD who p/w L leg weakness.  Patient has history of stroke in December 2019 with residual left arm weakness and left facial droop.  He has had a Aalyiah Camberos bit of left leg weakness but notes that since around 615 last night, he has had more significant left weakness with severe difficulty walking.  He also notes more problems with speaking.  He occasionally has double vision.  He denies any associated chest pain or shortness of breath.  He has chronic cough from COPD, does not usually use inhalers.  The history is provided by the patient.    Past Medical History:  Diagnosis Date  . Alcohol abuse    H/O  . Anxiety   . Arthritis   . Asthma   . CAD (coronary artery disease) 05/27/10   Cath: severe single vessell CAD left cx midportion obtuse marginal 2 to 3.  [name] CHF (congestive heart failure) ([place])   . COPD (chronic obstructive pulmonary disease) ([place])   . COPD (chronic obstructive pulmonary disease) ([place])   . Depression   . Diabetes mellitus, type 2 ([place]) 03/13/2015  . GERD (gastroesophageal reflux disease)   . HCAP (healthcare-associated pneumonia) 10/15/2015  . History of DVT of lower extremity   . Hyperlipemia   . Hyperlipidemia   . Hypertension   . Myocardial infarction ([place]) 2000  . Orbital fracture 12/2012  . OSA on CPAP   . Peripheral vascular disease, unspecified ([place])    08/20/10 doppler: increase in right ABI post-op. Left ABI stable. S/P bi-fem bypass surgery  . Pneumonia 04/04/2012  . Shortness of breath   . Stroke ([place])   . Tobacco abuse     Patient Active Problem  List   Diagnosis Date Noted  . Left leg weakness   . Dementia due to another medical condition, without behavioral disturbance ([place])   . Acute blood loss anemia   . Hypoalbuminemia due to protein-calorie malnutrition ([place])   . Pruritus   . Labile blood pressure   . Diabetes mellitus type 2 in obese ([place])   . Seizures ([place])   . Right middle cerebral artery stroke ([place]) 01/18/2018  . AMS (altered mental status) 11/29/2017  . Acute encephalopathy 11/28/2017  . Left shoulder pain 09/28/2017  . Cerebral thrombosis with cerebral infarction 07/18/2016  . Acute ischemic stroke ([place])   . Left hemiplegia ([place])   . Stroke-like symptoms 07/17/2016  . Insomnia 01/29/2016  . Depression 01/29/2016  . Alcohol abuse with intoxication ([place]) 10/16/2015  . Psoriasiform dermatitis 07/12/2015  . Dementia due to another medical condition ([place]) 07/12/2015  . Urinary incontinence 03/14/2015  . Type 2 diabetes mellitus with peripheral neuropathy ([place]) 03/13/2015  . Hemiparesis affecting left side as late effect of cerebrovascular accident ([place]) 02/18/2015  . Cerebrovascular accident (CVA) due to stenosis of right carotid artery ([place])   . Coronary artery disease involving native coronary artery of native heart without angina pectoris   . Tachypnea   . Thrombocytopenia ([place])   . History of stroke   . Stroke (cerebrum) ([place]) 02/06/2015  .  Arterial ischemic stroke, MCA (middle cerebral artery), right, acute ([place])   . DOE (dyspnea on exertion) 11/23/2014  . PAF (paroxysmal atrial fibrillation) ([place]) 11/23/2014  . HLD (hyperlipidemia) 11/01/2014  . Tobacco use disorder 11/01/2014  . Diastolic CHF, acute on chronic (HCC) 11/01/2014  . Cerebral infarction due to stenosis of right carotid artery ([place]) 11/01/2014  . Carotid stenosis 11/01/2014  . Hyperkalemia 10/24/2014  . H/O total knee replacement 10/24/2014  . Essential hypertension   . Polysubstance abuse ([place]) 08/28/2014  . Cocaine abuse ([place])   .  Intracranial carotid stenosis   . Headache around the eyes 08/27/2014  . Acute CVA (cerebrovascular accident) ([place]) 08/27/2014  . CVA (cerebral vascular accident) ([place]) 08/26/2014  . History of DVT (deep vein thrombosis)   . Alcoholism ([place])   . Embolic stroke ([place])   . Cerebral infarction due to embolism of right middle cerebral artery ([place])   . Hypotension   . AKI (acute kidney injury) ([place])   . Stroke ([place])   . CVA (cerebral infarction) 08/01/2014  . Alcohol dependence with withdrawal with complication ([place]) 11/94/1740  . DVT (deep venous thrombosis) ([place]) 02/22/2014  . Lower extremity edema 02/21/2014  . Chronic diastolic congestive heart failure ([place]) 11/21/2013  . CAD (coronary artery disease) 11/21/2013  . Alcohol abuse 11/21/2013  . GERD (gastroesophageal reflux disease) 08/10/2013  . Gout 08/10/2013  . Tobacco abuse 07/26/2013  . Trichiasis without entropion 04/16/2013  . Dizziness 04/10/2013  . Eyelid lesion 01/24/2013  . Enophthalmos due to trauma 01/19/2013  . Medial orbital wall fracture 01/19/2013  . Closed blow-out fracture of floor of orbit ([place]) 01/19/2013  . Binocular vision disorder with diplopia 01/03/2013  . Fracture of inferior orbital wall ([place]) 01/03/2013  . Fracture of orbital floor ([place]) 01/03/2013  . Pain of left eye 01/03/2013  . Peripheral vascular disease ([place]) 07/27/2012  . COPD (chronic obstructive pulmonary disease) ([place]) 04/04/2012  . HTN (hypertension) 04/04/2012    Past Surgical History:  Procedure Laterality Date  . abdoninal ao angio & bifem angio  05/27/10   Patent graft, occluded bil stents with no retrograde flow into the hypogastric arteries. 100% occl left ant. tibial artery, 70% to 80% to 100% stenosis right superficial fem artery above adductor canal. 100 % occl right ant. tibial vessell  . Aortogram w/ PTCA  09/292003  . BACK SURGERY    . CARDIAC CATHETERIZATION  05/27/10   severe CAD left cx  . CHOLECYSTECTOMY    .  ESOPHAGOGASTRIC FUNDOPLICATION    . EYE SURGERY    . FEMORAL BYPASS  08/19/10   Right Fem-Pop  . IR ANGIO INTRA EXTRACRAN SEL COM CAROTID INNOMINATE BILAT MOD SED  07/20/2016  . IR ANGIO INTRA EXTRACRAN SEL COM CAROTID INNOMINATE BILAT MOD SED  01/14/2018  . IR ANGIO VERTEBRAL SEL SUBCLAVIAN INNOMINATE UNI R MOD SED  07/20/2016  . IR ANGIO VERTEBRAL SEL VERTEBRAL BILAT MOD SED  01/14/2018  . IR ANGIO VERTEBRAL SEL VERTEBRAL UNI L MOD SED  07/20/2016  . IR RADIOLOGIST EVAL & MGMT  05/27/2016  . IR US GUIDE VASC ACCESS RIGHT  01/14/2018  . JOINT REPLACEMENT    . PERIPHERAL VASCULAR CATHETERIZATION N/A 12/10/2014   Procedure: Abdominal Aortogram;  Surgeon: Angelia Mould, MD;  Location: [facility];  Service: Cardiovascular;  Laterality: N/A;  . RADIOLOGY WITH ANESTHESIA N/A 02/08/2015   Procedure: RADIOLOGY WITH ANESTHESIA;  Surgeon: Luanne Bras, MD;  Location: [facility];  Service: Radiology;  Laterality: N/A;  .  TEE WITHOUT CARDIOVERSION N/A 08/29/2014   Procedure: TRANSESOPHAGEAL ECHOCARDIOGRAM (TEE);  Surgeon: Josue Hector, MD;  Location: [facility];  Service: Cardiovascular;  Laterality: N/A;  . TOTAL KNEE ARTHROPLASTY          Home Medications    Prior to Admission medications   Medication Sig Start Date End Date Taking? Authorizing Provider  allopurinol (ZYLOPRIM) 300 MG tablet Take 1 tablet (300 mg total) by mouth daily. 01/12/18  Yes Charlott Rakes, MD  atorvastatin (LIPITOR) 80 MG tablet Take 1 tablet (80 mg total) by mouth daily at 6 PM. 01/12/18  Yes Newlin, Enobong, MD  baclofen (LIORESAL) 10 MG tablet Take 1 tablet (10 mg total) by mouth at bedtime. 02/04/18  Yes Angiulli, Lavon Paganini, PA-C  carvedilol (COREG) 3.125 MG tablet Take 1 tablet (3.125 mg total) by mouth 2 (two) times daily with a meal. 02/04/18  Yes Angiulli, Lavon Paganini, PA-C  clopidogrel (PLAVIX) 75 MG tablet Take 1 tablet (75 mg total) by mouth daily. 01/12/18  Yes Newlin, Charlane Ferretti, MD    diphenhydramine-acetaminophen (TYLENOL PM) 25-500 MG TABS tablet Take 1 tablet by mouth at bedtime as needed.   Yes [provider]  DULoxetine (CYMBALTA) 60 MG capsule Take 1 capsule (60 mg total) by mouth daily. 01/12/18  Yes Charlott Rakes, MD  folic acid (FOLVITE) 1 MG tablet Take 1 tablet (1 mg total) by mouth daily. 01/19/18  Yes Shelly Coss, MD  hydrOXYzine (ATARAX/VISTARIL) 25 MG tablet Take 1 tablet (25 mg total) by mouth every 8 (eight) hours as needed for itching. 03/16/18  Yes Charlott Rakes, MD  levETIRAcetam (KEPPRA) 500 MG tablet Take 1 tablet (500 mg total) by mouth 2 (two) times daily. 01/12/18  Yes Charlott Rakes, MD  memantine (NAMENDA) 10 MG tablet Take 1 tablet (10 mg total) by mouth 2 (two) times daily. 02/04/18  Yes Angiulli, Lavon Paganini, PA-C  mometasone-formoterol (DULERA) 200-5 MCG/ACT AERO Inhale 2 puffs into the lungs 2 (two) times daily. 02/04/18  Yes Angiulli, Lavon Paganini, PA-C  pantoprazole (PROTONIX) 40 MG tablet Take 1 tablet (40 mg total) by mouth daily. 01/12/18  Yes Charlott Rakes, MD  pregabalin (LYRICA) 150 MG capsule Take 1 capsule (150 mg total) by mouth 2 (two) times daily. 01/12/18  Yes Newlin, Charlane Ferretti, MD  WIXELA INHUB 250-50 MCG/DOSE AEPB Inhale 1-2 puffs into the lungs daily. 03/11/18  Yes [provider]  acetaminophen (TYLENOL) 325 MG tablet Take 2 tablets (650 mg total) by mouth every 4 (four) hours as needed for mild pain (or temp > 37.5 C (99.5 F)). Patient not taking: Reported on 03/25/2018 02/04/18   Angiulli, Lavon Paganini, PA-C  aspirin 325 MG tablet Take 1 tablet (325 mg total) by mouth daily. Patient not taking: Reported on 03/25/2018 10/30/17   Patrecia Pour, MD  nicotine (NICODERM CQ - DOSED IN MG/24 HOURS) 21 mg/24hr patch Place 1 patch (21 mg total) onto the skin daily. Patient not taking: Reported on 03/25/2018 02/05/18   Angiulli, Lavon Paganini, PA-C    Family History Family History  Problem Relation Age of Onset  . Heart attack  Mother   . Heart failure Mother   . Cirrhosis Father   . Heart failure Brother   . Cancer Brother   . Hypertension Neg Hx        UNKNOWN  . Stroke Neg Hx        UNKNOWN    Social History Social History   Tobacco Use  . Smoking status: Current Every  Day Smoker    Packs/day: 0.50    Years: 50.00    Pack years: 25.00    Types: Cigars, Cigarettes    Start date: 02/09/1961  . Smokeless tobacco: Former Systems developer    Types: Chew  . Tobacco comment: 2 ppd, full flavor  Substance Use Topics  . Alcohol use: Yes    Comment: occasionally, "I might drink 1 every 6 months"  . Drug use: Yes    Types: Cocaine    Comment: "I smoke crack every once in a while"; last smoked yesterday     Allergies   Darvocet [propoxyphene n-acetaminophen]; Haldol [haloperidol decanoate]; Acetaminophen; Metformin and related; and Norco [hydrocodone-acetaminophen]   Review of Systems Review of Systems All other systems reviewed and are negative except that which was mentioned in HPI   Physical Exam Updated Vital Signs BP (!) 138/57 (BP Location: Left Arm)   Pulse 73   Temp 98.1 F (36.7 C) (Oral)   Resp 20   Ht 6' (1.829 m)   Wt 101.5 kg   SpO2 95%   BMI 30.35 kg/m   Physical Exam Vitals signs and nursing note reviewed.  Constitutional:      General: He is not in acute distress.    Appearance: He is well-developed.  HENT:     Head: Normocephalic and atraumatic.  Eyes:     Conjunctiva/sclera: Conjunctivae normal.     Pupils: Pupils are equal, round, and reactive to light.  Neck:     Musculoskeletal: Neck supple.  Cardiovascular:     Rate and Rhythm: Normal rate and regular rhythm.     Heart sounds: Normal heart sounds. No murmur.  Pulmonary:     Effort: Pulmonary effort is normal.     Breath sounds: Wheezing present.     Comments: End expiratory wheezes and rhonchi b/l bases Abdominal:     General: Bowel sounds are normal. There is no distension.     Palpations: Abdomen is soft.      Tenderness: There is no abdominal tenderness.  Skin:    General: Skin is warm and dry.  Neurological:     Mental Status: He is alert and oriented to person, place, and time.     Comments: L facial droop, L tongue deviation with protrusion; dysarthria but no aphasia; flacid paralysis L arm and L leg with intact sensation throughout; normal finger-to-nose testing R hand, 5/5 strength RUE/RLE  Psychiatric:        Mood and Affect: Mood normal.      ED Treatments / Results  Labs (all labs ordered are listed, but only abnormal results are displayed) Labs Reviewed  CBC - Abnormal; Notable for the following components:      Result Value   Hemoglobin 11.3 (*)    MCH 24.9 (*)    MCHC 28.8 (*)    All other components within normal limits  CBC - Abnormal; Notable for the following components:   Hemoglobin 11.0 (*)    HCT 36.5 (*)    MCH 25.4 (*)    All other components within normal limits  CBG MONITORING, ED - Abnormal; Notable for the following components:   Glucose-Capillary 139 (*)    All other components within normal limits  PROTIME-INR  APTT  DIFFERENTIAL  COMPREHENSIVE METABOLIC PANEL  ETHANOL  CREATININE, SERUM  RAPID URINE DRUG SCREEN, HOSP PERFORMED  HEMOGLOBIN A1C  LIPID PANEL  CBC WITH DIFFERENTIAL/PLATELET  BASIC METABOLIC PANEL  I-STAT CREATININE, ED  I-STAT TROPONIN, ED  EKG None  Radiology Ct Angio Head W Or Wo Contrast  Result Date: 03/25/2018 CLINICAL DATA:  68 year old male code stroke presentation with left side paralysis. Occluded right ICA and right frontal lobe infarct in December. EXAM: CT ANGIOGRAPHY HEAD AND NECK TECHNIQUE: Multidetector CT imaging of the head and neck was performed using the standard protocol during bolus administration of intravenous contrast. Multiplanar CT image reconstructions and MIPs were obtained to evaluate the vascular anatomy. Carotid stenosis measurements (when applicable) are obtained utilizing NASCET criteria, using  the distal internal carotid diameter as the denominator. CONTRAST:  17m ISOVUE-370 IOPAMIDOL (ISOVUE-370) INJECTION 76% COMPARISON:  Plain head CT earlier today. Cerebral angiogram 01/14/2018. brain MRI 01/13/2018. CTA 10/25/2017. FINDINGS: CT HEAD Brain: Stable gray-white matter differentiation throughout the brain. Patchy right MCA hypodensity appears stable from earlier today. No acute intracranial hemorrhage identified. No midline shift, mass effect, or evidence of intracranial mass lesion. No ventriculomegaly. Calvarium and skull base: Stable. Paranasal sinuses: Chronic left maxillary sinusitis. Orbits: No acute finding.  Previous left orbital floor ORIF. CTA NECK Skeleton: Absent dentition. C2-C3 ankylosis versus incomplete segmentation. No acute osseous abnormality identified. Upper chest: Stable since September and negative. Other neck: Stable and negative. Aortic arch: Extensive Calcified aortic atherosclerosis. Three vessel arch configuration. Right carotid system: Bulky plaque in the brachiocephalic artery appears stable the right CCA origin remains patent. The right CCA is patent to the level of the right ICA stent, but the stent is occluded as before. No reconstituted flow in the cervical right ICA. Left carotid system: Soft and calcified left CCA origin plaque appears stable with less than 50 % stenosis with respect to the distal vessel. Multifocal calcified plaque at the left carotid bifurcation and continuing to the left ICA bulb with less than 50 % stenosis with respect to the distal vessel. The left ICA remains patent to the skull base. Vertebral arteries: The right subclavian artery origin remains patent despite plaque. Stable calcified plaque at the right vertebral artery origin resulting in mild to moderate stenosis. Stable right V1 and V3 calcified plaque with mild stenosis. The right vertebral is non dominant and patent to the skull base. Stable proximal left subclavian artery plaque without  significant stenosis. The left vertebral artery is dominant. Calcified plaque at the left vertebral artery origin results in mild stenosis and appears stable. (Series 10, image 111). Intermittent left V2 and V3 calcified plaque with mild stenosis is stable. The left vertebral remains patent to the skull base, and the left PICA origin is early and patent. CTA HEAD Posterior circulation: Fairly codominant distal vertebral arteries without stenosis. Patent PICA origins and vertebrobasilar junction. Patent basilar artery without stenosis. Normal basilar tip, SCA and PCA origins. Posterior communicating arteries are diminutive or absent. Bilateral PCA branches are normal aside from mild irregularity which is greater on the left. Anterior circulation: The right ICA siphon is reconstituted distally and stable in appearance since September 2019. There is a distal siphon stent which is patent. The right ACA and MCA origins are patent without stenosis, but there is asymmetrically decreased right ACA and MCA enhancement. Still, the right MCA bifurcation is patent and right MCA branches appear stable since September. The anterior communicating artery is normal, but the right ACA remains hypoenhancing compared to the left. No ACA branch occlusion identified. Left ICA siphon is patent with moderate calcified plaque resulting in mild left siphon stenosis. Normal left ICA terminus, left MCA and ACA origin. Normal left A1. Left MCA M1, bifurcation, and left MCA  branches are within normal limits. Venous sinuses: Not evaluated due to early contrast timing. Anatomic variants: Dominant left vertebral artery. Review of the MIP images confirms the above findings IMPRESSION: 1. Negative for emergent large vessel occlusion. 2. Stable occluded cervical Right ICA stent and suboptimal reconstitution of the distal Right ICA terminus. The right ACA and MCA remain patent although with decreased enhancement compared to the left anterior  circulation. 3. Left carotid, left anterior circulation and posterior circulation are stable since September. Notable for: - Left carotid calcified plaque without significant stenosis. - multifocal bilateral vertebral artery plaque with mild to moderate right and mild left vertebral artery stenosis. 4. Stable non contrast CT appearance of the brain since 1456 hours today. 5.  Aortic Atherosclerosis (ICD10-I70.0). Salient findings were communicated to Dr. Lorraine Lax at 3:34 pm on 03/25/2018 by text page via the [facility] messaging system. Electronically Signed   By: Genevie Ann M.D.   On: 03/25/2018 15:35   Ct Angio Neck W Or Wo Contrast  Result Date: 03/25/2018 CLINICAL DATA:  68 year old male code stroke presentation with left side paralysis. Occluded right ICA and right frontal lobe infarct in December. EXAM: CT ANGIOGRAPHY HEAD AND NECK TECHNIQUE: Multidetector CT imaging of the head and neck was performed using the standard protocol during bolus administration of intravenous contrast. Multiplanar CT image reconstructions and MIPs were obtained to evaluate the vascular anatomy. Carotid stenosis measurements (when applicable) are obtained utilizing NASCET criteria, using the distal internal carotid diameter as the denominator. CONTRAST:  48m ISOVUE-370 IOPAMIDOL (ISOVUE-370) INJECTION 76% COMPARISON:  Plain head CT earlier today. Cerebral angiogram 01/14/2018. brain MRI 01/13/2018. CTA 10/25/2017. FINDINGS: CT HEAD Brain: Stable gray-white matter differentiation throughout the brain. Patchy right MCA hypodensity appears stable from earlier today. No acute intracranial hemorrhage identified. No midline shift, mass effect, or evidence of intracranial mass lesion. No ventriculomegaly. Calvarium and skull base: Stable. Paranasal sinuses: Chronic left maxillary sinusitis. Orbits: No acute finding.  Previous left orbital floor ORIF. CTA NECK Skeleton: Absent dentition. C2-C3 ankylosis versus incomplete segmentation. No acute  osseous abnormality identified. Upper chest: Stable since September and negative. Other neck: Stable and negative. Aortic arch: Extensive Calcified aortic atherosclerosis. Three vessel arch configuration. Right carotid system: Bulky plaque in the brachiocephalic artery appears stable the right CCA origin remains patent. The right CCA is patent to the level of the right ICA stent, but the stent is occluded as before. No reconstituted flow in the cervical right ICA. Left carotid system: Soft and calcified left CCA origin plaque appears stable with less than 50 % stenosis with respect to the distal vessel. Multifocal calcified plaque at the left carotid bifurcation and continuing to the left ICA bulb with less than 50 % stenosis with respect to the distal vessel. The left ICA remains patent to the skull base. Vertebral arteries: The right subclavian artery origin remains patent despite plaque. Stable calcified plaque at the right vertebral artery origin resulting in mild to moderate stenosis. Stable right V1 and V3 calcified plaque with mild stenosis. The right vertebral is non dominant and patent to the skull base. Stable proximal left subclavian artery plaque without significant stenosis. The left vertebral artery is dominant. Calcified plaque at the left vertebral artery origin results in mild stenosis and appears stable. (Series 10, image 111). Intermittent left V2 and V3 calcified plaque with mild stenosis is stable. The left vertebral remains patent to the skull base, and the left PICA origin is early and patent. CTA HEAD Posterior circulation: Fairly codominant  distal vertebral arteries without stenosis. Patent PICA origins and vertebrobasilar junction. Patent basilar artery without stenosis. Normal basilar tip, SCA and PCA origins. Posterior communicating arteries are diminutive or absent. Bilateral PCA branches are normal aside from mild irregularity which is greater on the left. Anterior circulation: The  right ICA siphon is reconstituted distally and stable in appearance since September 2019. There is a distal siphon stent which is patent. The right ACA and MCA origins are patent without stenosis, but there is asymmetrically decreased right ACA and MCA enhancement. Still, the right MCA bifurcation is patent and right MCA branches appear stable since September. The anterior communicating artery is normal, but the right ACA remains hypoenhancing compared to the left. No ACA branch occlusion identified. Left ICA siphon is patent with moderate calcified plaque resulting in mild left siphon stenosis. Normal left ICA terminus, left MCA and ACA origin. Normal left A1. Left MCA M1, bifurcation, and left MCA branches are within normal limits. Venous sinuses: Not evaluated due to early contrast timing. Anatomic variants: Dominant left vertebral artery. Review of the MIP images confirms the above findings IMPRESSION: 1. Negative for emergent large vessel occlusion. 2. Stable occluded cervical Right ICA stent and suboptimal reconstitution of the distal Right ICA terminus. The right ACA and MCA remain patent although with decreased enhancement compared to the left anterior circulation. 3. Left carotid, left anterior circulation and posterior circulation are stable since September. Notable for: - Left carotid calcified plaque without significant stenosis. - multifocal bilateral vertebral artery plaque with mild to moderate right and mild left vertebral artery stenosis. 4. Stable non contrast CT appearance of the brain since 1456 hours today. 5.  Aortic Atherosclerosis (ICD10-I70.0). Salient findings were communicated to Dr. Lorraine Lax at 3:34 pm on 03/25/2018 by text page via the [facility] messaging system. Electronically Signed   By: Genevie Ann M.D.   On: 03/25/2018 15:35   Mr Brain Wo Contrast (neuro Protocol)  Result Date: 03/25/2018 CLINICAL DATA:  Left arm weakness and facial droop. EXAM: MRI HEAD WITHOUT CONTRAST TECHNIQUE:  Multiplanar, multiecho pulse sequences of the brain and surrounding structures were obtained without intravenous contrast. COMPARISON:  Head CT and CTA 03/25/2018 FINDINGS: BRAIN: There is abnormal diffusion restriction predominantly within the right anterior cerebral artery territory, with small foci of diffusion abnormality within the anterior portion of the right middle cerebral artery territory. The midline structures are normal. No midline shift or other mass effect. Confluent hyperintense T2-weighted signal within the right MCA corona radiata. There is an old posterior right parietal infarct. Multifocal white matter hyperintensity bilaterally otherwise. There is hyperintense T2-weighted signal also within the right midbrain and pons. There is generalized atrophy. Susceptibility-sensitive sequences show no chronic microhemorrhage or superficial siderosis. VASCULAR: Loss of the normal right ICA flow void. SKULL AND UPPER CERVICAL SPINE: Calvarial bone marrow signal is normal. There is no skull base mass. Visualized upper cervical spine and soft tissues are normal. SINUSES/ORBITS: Moderate left maxillary mucosal thickening. There are bilateral lens replacements. IMPRESSION: 1. Acute ischemia within the anterior right hemisphere, predominantly within the right ACA territory, with small foci also present in the MCA territory. 2. Loss of the normal right ICA flow void, consistent with known occlusion. 3. No hemorrhage or mass effect. 4. 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 Electronically Signed   By: Ulyses Jarred M.D.   On: 03/25/2018 18:37   Ct Head Code Stroke Wo Contrast  Result Date: 03/25/2018 CLINICAL DATA:  Code stroke.  Left sided paralysis. EXAM: CT HEAD  WITHOUT CONTRAST TECHNIQUE: Contiguous axial images were obtained from the base of the skull through the vertex without intravenous contrast. COMPARISON:  01/12/2018 FINDINGS: Brain: Brainstem and cerebellum remain unremarkable. Left  hemisphere shows atrophy with chronic small-vessel ischemic changes. Right hemisphere shows progressive right frontal cortical and subcortical low-density related to a regional infarction that was acute in December of 2019. Older right posterior frontal cortical and subcortical infarction appears the same. Chronic small-vessel ischemic changes appear the same. No sign of hyperacute insult. No hemorrhage, hydrocephalus or extra-axial collection. Vascular: There is atherosclerotic calcification of the major vessels at the base of the brain. Right carotid stent. Skull: No acute finding.  Previous left orbital reconstruction. Sinuses/Orbits: Clear except for chronic mucosal thickening of the left maxillary sinus. Orbits negative. Other: None ASPECTS ([place] Stroke Program Early CT Score) is problematic given the late subacute right frontal infarction. Allowing for that, no acute ischemia is identified. IMPRESSION: 1. No acute finding today. Late subacute infarction in the right frontal cortical and subcortical brain which was acute in December of 2019. Older right posterior frontal cortical and subcortical infarction. 2. ASPECTS is problematic because of the subacute right frontal infarction. 3. These results were communicated to Dr. Lorraine Lax at 3:12 pmon 2/14/2020by text page via the [facility] messaging system. Electronically Signed   By: Nelson Chimes M.D.   On: 03/25/2018 15:13    Procedures Procedures (including critical care time)  Medications Ordered in ED Medications  sodium chloride flush (NS) 0.9 % injection 3 mL (has no administration in time range)  LORazepam (ATIVAN) tablet 1 mg (has no administration in time range)    Or  LORazepam (ATIVAN) injection 1 mg (has no administration in time range)  thiamine (VITAMIN B-1) tablet 100 mg (100 mg Oral Given 03/25/18 2029)    Or  thiamine (B-1) injection 100 mg ( Intravenous See Alternative 03/25/18 2029)  multivitamin with minerals tablet 1 tablet (1 tablet  Oral Given 03/25/18 2029)  0.9 %  sodium chloride infusion ( Intravenous Rate/Dose Verify 03/26/18 0000)  acetaminophen (TYLENOL) tablet 650 mg (has no administration in time range)    Or  acetaminophen (TYLENOL) solution 650 mg (has no administration in time range)    Or  acetaminophen (TYLENOL) suppository 650 mg (has no administration in time range)  enoxaparin (LOVENOX) injection 40 mg (40 mg Subcutaneous Given 03/25/18 2029)  levETIRAcetam (KEPPRA) tablet 500 mg (500 mg Oral Given 03/26/18 0006)  allopurinol (ZYLOPRIM) tablet 300 mg (has no administration in time range)  atorvastatin (LIPITOR) tablet 80 mg (has no administration in time range)  memantine (NAMENDA) tablet 10 mg (10 mg Oral Given 03/26/18 0005)  pantoprazole (PROTONIX) EC tablet 40 mg (has no administration in time range)  DULoxetine (CYMBALTA) DR capsule 60 mg (has no administration in time range)  mometasone-formoterol (DULERA) 200-5 MCG/ACT inhaler 2 puff (has no administration in time range)  iopamidol (ISOVUE-370) 76 % injection 75 mL (75 mLs Intravenous Contrast Given 03/25/18 1512)   stroke: mapping our early stages of recovery book ( Does not apply Given 03/25/18 2027)     Initial Impression / Assessment and Plan / ED Course  I have reviewed the triage vital signs and the nursing notes.  Pertinent labs & imaging results that were available during my care of the patient were reviewed by me and considered in my medical decision making (see chart for details).    The patient initially arrived as code stroke for new deficits.  He was taken to CT scanner  and met by the neurology service.  They determined that based on timeline, he was not a TPA candidate.  Head CT negative for hemorrhage.  CTA shows no acute large vessel occlusion.  Neurology service recommended medicine admission for stroke work-up.  I have ordered MRI.  Discussed admission with family medicine teaching service and patient admitted for further  work-up.  Final Clinical Impressions(s) / ED Diagnoses   Final diagnoses:  Left leg weakness  Dysarthria    ED Discharge Orders    None       Bron Snellings, Wenda Overland, MD 03/26/18 [phone]

## 2018-03-26 NOTE — Procedures (Signed)
Came bedside to do echocardiogram, but patient has been transported to another location at this time.

## 2018-03-26 NOTE — Evaluation (Signed)
Physical Therapy Evaluation Patient Details Name: [name] MRN: [id number] DOB: 04-Jan-1951 Today's Date: 03/26/2018   History of Present Illness  Pt is a 68 y.o. male admitted 03/25/18 with L-side LE and face weakness; MRI showed acute ischemia in R ACA distribution with small foci in MCA. PMH includes prior stroke (residual LUE weakness), COPD, seizure disorder, DVT, CHF, polysubstance abuse, dementia. Of note, recent admission 01/2018 with R MCA infarct with d/c to CIR.    Clinical Impression  Pt presents with an overall decrease in functional mobility secondary to above. PTA, pt mod indep with SPC since d/c from [place] 01/2018; lives alone. Today, pt required min-modA+2 to maintain balance with standing and gait training due to new onset LLE weakness. Pt motivated to participate and regain strength; feel he would benefit from intensive CIR-level therapies to maximize functional mobility and independence prior to d/c home. Will follow acutely to address established goals.    Follow Up Recommendations CIR;Supervision for mobility/OOB    Equipment Recommendations  (TBD)    Recommendations for Other Services       Precautions / Restrictions Precautions Precautions: Fall Restrictions Weight Bearing Restrictions: No      Mobility  Bed Mobility               General bed mobility comments: Received sitting in recliner  Transfers Overall transfer level: Needs assistance   Transfers: Sit to/from Stand Sit to Stand: Mod assist;+2 safety/equipment         General transfer comment: Pt with no awareness of correct LLE positioning prior to standing. ModA to assist trunk elevation on R-side, totalA to reposition LLE and assist to block L knee buckling  Ambulation/Gait Ambulation/Gait assistance: Min assist;+2 safety/equipment Gait Distance (Feet): 20 Feet Assistive device: Straight cane Gait Pattern/deviations: Step-to pattern;Decreased dorsiflexion - left;Decreased  weight shift to left;Trunk flexed Gait velocity: Decreased Gait velocity interpretation: <1.31 ft/sec, indicative of household ambulator General Gait Details: Slow, unsteady gait with SPC and minA+2; pt dragging LLE, no active L foot dorsiflextion, and L knee buckling with increasing fatigue. MinA to prevent multiple LOB  Stairs            Wheelchair Mobility    Modified Rankin (Stroke Patients Only) Modified Rankin (Stroke Patients Only) Pre-Morbid Rankin Score: Slight disability Modified Rankin: Moderately severe disability     Balance Overall balance assessment: Needs assistance   Sitting balance-Leahy Scale: Fair       Standing balance-Leahy Scale: Poor Standing balance comment: Very poor balance strategies/postural reactions with minimal challenge                             Pertinent Vitals/Pain Pain Assessment: No/denies pain    Home Living Family/patient expects to be discharged to:: Private residence Living Arrangements: Alone Available Help at Discharge: Personal care attendant;Available PRN/intermittently Type of Home: Apartment Home Access: Elevator     Home Layout: One level Home Equipment: Cane - single point;Shower seat;Walker - 4 wheels;Grab bars - tub/shower;Bedside commode;Wheelchair - power Additional Comments: Power wheelchair has not been charged for 4 months    Prior Function Level of Independence: Needs assistance   Gait / Transfers Assistance Needed: Ambulatory with [facility]  ADL's / Homemaking Assistance Needed: Unclear why pt does not have continued aide assist; aide used to help with household tasks, ADLs and transportation        Hand Dominance        Extremity/Trunk Assessment  Upper Extremity Assessment Upper Extremity Assessment: LUE deficits/detail LUE Deficits / Details: Decreased active movement. Limted PROM at hand, wrist, elbow, and shoulder. Increased tone    Lower Extremity Assessment Lower Extremity  Assessment: LLE deficits/detail LLE Deficits / Details: L knee ext 4/5, hip flex 3/5, hip ext <3/5, knee flex <3/5, ankle DF 1/5 LLE Coordination: decreased gross motor;decreased fine motor       Communication      Cognition Arousal/Alertness: Awake/alert   Overall Cognitive Status: History of cognitive impairments - at baseline Area of Impairment: Attention;Memory;Following commands;Safety/judgement;Awareness;Problem solving                   Current Attention Level: Selective Memory: Decreased short-term memory Following Commands: Follows one step commands inconsistently;Follows one step commands with increased time Safety/Judgement: Decreased awareness of deficits;Decreased awareness of safety Awareness: Emergent Problem Solving: Slow processing;Requires verbal cues;Requires tactile cues General Comments: H/o dementia per chart. A&O x4. Slowed processing      General Comments      Exercises     Assessment/Plan    PT Assessment Patient needs continued PT services  PT Problem List Decreased strength;Decreased range of motion;Decreased activity tolerance;Decreased balance;Decreased mobility;Decreased cognition;Decreased knowledge of use of DME;Decreased safety awareness       PT Treatment Interventions DME instruction;Gait training;Stair training;Functional mobility training;Therapeutic activities;Therapeutic exercise;Balance training;Neuromuscular re-education;Cognitive remediation;Patient/family education    PT Goals (Current goals can be found in the Care Plan section)  Acute Rehab PT Goals Patient Stated Goal: Agreeble to post-acute rehab  PT Goal Formulation: With patient Time For Goal Achievement: 04/09/18 Potential to Achieve Goals: Good    Frequency Min 4X/week   Barriers to discharge        Co-evaluation PT/OT/SLP Co-Evaluation/Treatment: Yes Reason for Co-Treatment: Necessary to address cognition/behavior during functional activity;For  patient/therapist safety;To address functional/ADL transfers PT goals addressed during session: Mobility/safety with mobility;Balance;Proper use of DME         AM-PAC PT "6 Clicks" Mobility  Outcome Measure Help needed turning from your back to your side while in a flat bed without using bedrails?: A Little Help needed moving from lying on your back to sitting on the side of a flat bed without using bedrails?: A Little Help needed moving to and from a bed to a chair (including a wheelchair)?: A Lot Help needed standing up from a chair using your arms (e.g., wheelchair or bedside chair)?: A Lot Help needed to walk in hospital room?: A Lot Help needed climbing 3-5 steps with a railing? : A Lot 6 Click Score: 14    End of Session Equipment Utilized During Treatment: Gait belt Activity Tolerance: Patient tolerated treatment well Patient left: in chair;with call bell/phone within reach;with chair alarm set Nurse Communication: Mobility status PT Visit Diagnosis: Other abnormalities of gait and mobility (R26.89);Muscle weakness (generalized) (M62.81);Hemiplegia and hemiparesis Hemiplegia - Right/Left: Left Hemiplegia - dominant/non-dominant: Non-dominant Hemiplegia - caused by: Cerebral infarction    Time: 0071-2197 PT Time Calculation (min) (ACUTE ONLY): 25 min   Charges:   PT Evaluation $PT Eval Moderate Complexity: 1 Mod        Mabeline Caras, PT, DPT Acute Rehabilitation Services  Pager [phone] Office [place] 03/26/2018, 10:54 AM

## 2018-03-26 NOTE — Progress Notes (Signed)
STROKE TEAM PROGRESS NOTE  HPI ( Dr Lorraine Lax) [name] is an 68 y.o. male  With PMH CVA ( 2019) with residual left side deficits, HTN, HLD, DM2, CHF, CAD, ETOH abuse, COPD who presented to [facility] ed as a code stroke for left side paralysis and slurred speech.  Yesterday patient was home and noticed that his left leg was weaker than normal. Patient endorses that he has had leg weakness bilaterally since 2017. Patient has had strokes in the past. He states his last was 01/2018 and he normally has left arm weakness. yesterday the left leg weakness and slurred speech was new. Patient endorses smoking crack. He last smoked crack 03/24/2018. occasional ETOH use. Endorses smoking. ED course:  CTH: no hemorrhage CTA: NO LVO Hx stroke/TIA:  01/2018: right MCA infarcts 10/2017:right MCA territory and watershed infarcts secondary to occluded R ICA stent in setting of multiple uncontrolled risk factors 6/2018patchy multifocal acute ischemic right MCA territory infarcts - likely embolic from right ICA stent 7/2016New Non-dominant right posterior frontal infarct secondary to hypotension in setting of severe R paraclinoid stenosis 6/2016Non-dominant watershed infarcts likely due to hypotension with intracranial right carotid stenosis. Date last known well: 03/24/2018 Time last known well:1815 tPA Given: No: outside of window for  Modified Rankin: Rankin Score=3 NIHSS:14  SUBJECTIVE  he remains neurologically unchanged with persistent dense left hemiplegia. No other changes noted overnight.MRI scan the brain does confirm an acute infarct in the anterior right hemisphere predominantly in the [place] territory with a smaller foci in the MCA territory as well. Right ICA remains occluded  OBJECTIVE Vitals:   03/26/18 0812 03/26/18 1201  BP: (!) 145/69 (!) 163/74  Pulse: 70 67  Resp: 18 20  Temp: 98.4 F (36.9 C) 99 F (37.2 C)  SpO2: 94% 99%     Physical Exam Obese middle-aged Caucasian  male not in distress. . Afebrile. Head is nontraumatic. Neck is supple without bruit.    Cardiac exam no murmur or gallop. Lungs are clear to auscultation. Distal pulses are well felt.  Neurological Exam :  Awake alert oriented to time place and person. Mild dysarthria but can be easily understood. No aphasia. Follows commands well. Extraocular movements are full range without nystagmus. Blinks to threat bilaterally. Fundi not visualized. Vision acuity seems adequate. Mild left lower facial weakness. Tongue midline. Motor system exam reveals dense left hemiplegia with grade 1-2/5 strength in the upper and lower extremity. Normal strength in the right side. Touch pinprick sensation are preserved bilaterally. Vibration is normal. Deep tendon reflexes are brisker on the right than left. Both plantars are downgoing. Gait not tested.  Pertinent Laboratory Studies (past 3 days) / Diagnostics (past 24h) Recent Labs    03/25/18 1448 03/25/18 1453 03/25/18 1924 03/26/18 0614  WBC 6.2  --  6.1 5.0  HGB 11.3*  --  11.0* 9.9*  PLT 223  --  214 183  NA 144  --   --  142  K 4.2  --   --  3.9  CREATININE 0.92 0.80 0.79 0.62  GLUCOSE 88  --   --  97    Ct Angio Head W Or Wo Contrast  Result Date: 03/25/2018 CLINICAL DATA:  68 year old male code stroke presentation with left side paralysis. Occluded right ICA and right frontal lobe infarct in December. EXAM: CT ANGIOGRAPHY HEAD AND NECK TECHNIQUE: Multidetector CT imaging of the head and neck was performed using the standard protocol during bolus administration of intravenous contrast. Multiplanar CT image  reconstructions and MIPs were obtained to evaluate the vascular anatomy. Carotid stenosis measurements (when applicable) are obtained utilizing NASCET criteria, using the distal internal carotid diameter as the denominator. CONTRAST:  50mL ISOVUE-370 IOPAMIDOL (ISOVUE-370) INJECTION 76% COMPARISON:  Plain head CT earlier today. Cerebral angiogram 01/14/2018.  brain MRI 01/13/2018. CTA 10/25/2017. FINDINGS: CT HEAD Brain: Stable gray-white matter differentiation throughout the brain. Patchy right MCA hypodensity appears stable from earlier today. No acute intracranial hemorrhage identified. No midline shift, mass effect, or evidence of intracranial mass lesion. No ventriculomegaly. Calvarium and skull base: Stable. Paranasal sinuses: Chronic left maxillary sinusitis. Orbits: No acute finding.  Previous left orbital floor ORIF. CTA NECK Skeleton: Absent dentition. C2-C3 ankylosis versus incomplete segmentation. No acute osseous abnormality identified. Upper chest: Stable since September and negative. Other neck: Stable and negative. Aortic arch: Extensive Calcified aortic atherosclerosis. Three vessel arch configuration. Right carotid system: Bulky plaque in the brachiocephalic artery appears stable the right CCA origin remains patent. The right CCA is patent to the level of the right ICA stent, but the stent is occluded as before. No reconstituted flow in the cervical right ICA. Left carotid system: Soft and calcified left CCA origin plaque appears stable with less than 50 % stenosis with respect to the distal vessel. Multifocal calcified plaque at the left carotid bifurcation and continuing to the left ICA bulb with less than 50 % stenosis with respect to the distal vessel. The left ICA remains patent to the skull base. Vertebral arteries: The right subclavian artery origin remains patent despite plaque. Stable calcified plaque at the right vertebral artery origin resulting in mild to moderate stenosis. Stable right V1 and V3 calcified plaque with mild stenosis. The right vertebral is non dominant and patent to the skull base. Stable proximal left subclavian artery plaque without significant stenosis. The left vertebral artery is dominant. Calcified plaque at the left vertebral artery origin results in mild stenosis and appears stable. (Series 10, image 111).  Intermittent left V2 and V3 calcified plaque with mild stenosis is stable. The left vertebral remains patent to the skull base, and the left PICA origin is early and patent. CTA HEAD Posterior circulation: Fairly codominant distal vertebral arteries without stenosis. Patent PICA origins and vertebrobasilar junction. Patent basilar artery without stenosis. Normal basilar tip, SCA and PCA origins. Posterior communicating arteries are diminutive or absent. Bilateral PCA branches are normal aside from mild irregularity which is greater on the left. Anterior circulation: The right ICA siphon is reconstituted distally and stable in appearance since September 2019. There is a distal siphon stent which is patent. The right ACA and MCA origins are patent without stenosis, but there is asymmetrically decreased right ACA and MCA enhancement. Still, the right MCA bifurcation is patent and right MCA branches appear stable since September. The anterior communicating artery is normal, but the right ACA remains hypoenhancing compared to the left. No ACA branch occlusion identified. Left ICA siphon is patent with moderate calcified plaque resulting in mild left siphon stenosis. Normal left ICA terminus, left MCA and ACA origin. Normal left A1. Left MCA M1, bifurcation, and left MCA branches are within normal limits. Venous sinuses: Not evaluated due to early contrast timing. Anatomic variants: Dominant left vertebral artery. Review of the MIP images confirms the above findings IMPRESSION: 1. Negative for emergent large vessel occlusion. 2. Stable occluded cervical Right ICA stent and suboptimal reconstitution of the distal Right ICA terminus. The right ACA and MCA remain patent although with decreased enhancement compared to the  left anterior circulation. 3. Left carotid, left anterior circulation and posterior circulation are stable since September. Notable for: - Left carotid calcified plaque without significant stenosis. -  multifocal bilateral vertebral artery plaque with mild to moderate right and mild left vertebral artery stenosis. 4. Stable non contrast CT appearance of the brain since 1456 hours today. 5.  Aortic Atherosclerosis (ICD10-I70.0). Salient findings were communicated to Dr. Lorraine Lax at 3:34 pm on 03/25/2018 by text page via the [facility] messaging system. Electronically Signed   By: Genevie Ann M.D.   On: 03/25/2018 15:35   Ct Angio Neck W Or Wo Contrast  Result Date: 03/25/2018 CLINICAL DATA:  68 year old male code stroke presentation with left side paralysis. Occluded right ICA and right frontal lobe infarct in December. EXAM: CT ANGIOGRAPHY HEAD AND NECK TECHNIQUE: Multidetector CT imaging of the head and neck was performed using the standard protocol during bolus administration of intravenous contrast. Multiplanar CT image reconstructions and MIPs were obtained to evaluate the vascular anatomy. Carotid stenosis measurements (when applicable) are obtained utilizing NASCET criteria, using the distal internal carotid diameter as the denominator. CONTRAST:  53mL ISOVUE-370 IOPAMIDOL (ISOVUE-370) INJECTION 76% COMPARISON:  Plain head CT earlier today. Cerebral angiogram 01/14/2018. brain MRI 01/13/2018. CTA 10/25/2017. FINDINGS: CT HEAD Brain: Stable gray-white matter differentiation throughout the brain. Patchy right MCA hypodensity appears stable from earlier today. No acute intracranial hemorrhage identified. No midline shift, mass effect, or evidence of intracranial mass lesion. No ventriculomegaly. Calvarium and skull base: Stable. Paranasal sinuses: Chronic left maxillary sinusitis. Orbits: No acute finding.  Previous left orbital floor ORIF. CTA NECK Skeleton: Absent dentition. C2-C3 ankylosis versus incomplete segmentation. No acute osseous abnormality identified. Upper chest: Stable since September and negative. Other neck: Stable and negative. Aortic arch: Extensive Calcified aortic atherosclerosis. Three vessel arch  configuration. Right carotid system: Bulky plaque in the brachiocephalic artery appears stable the right CCA origin remains patent. The right CCA is patent to the level of the right ICA stent, but the stent is occluded as before. No reconstituted flow in the cervical right ICA. Left carotid system: Soft and calcified left CCA origin plaque appears stable with less than 50 % stenosis with respect to the distal vessel. Multifocal calcified plaque at the left carotid bifurcation and continuing to the left ICA bulb with less than 50 % stenosis with respect to the distal vessel. The left ICA remains patent to the skull base. Vertebral arteries: The right subclavian artery origin remains patent despite plaque. Stable calcified plaque at the right vertebral artery origin resulting in mild to moderate stenosis. Stable right V1 and V3 calcified plaque with mild stenosis. The right vertebral is non dominant and patent to the skull base. Stable proximal left subclavian artery plaque without significant stenosis. The left vertebral artery is dominant. Calcified plaque at the left vertebral artery origin results in mild stenosis and appears stable. (Series 10, image 111). Intermittent left V2 and V3 calcified plaque with mild stenosis is stable. The left vertebral remains patent to the skull base, and the left PICA origin is early and patent. CTA HEAD Posterior circulation: Fairly codominant distal vertebral arteries without stenosis. Patent PICA origins and vertebrobasilar junction. Patent basilar artery without stenosis. Normal basilar tip, SCA and PCA origins. Posterior communicating arteries are diminutive or absent. Bilateral PCA branches are normal aside from mild irregularity which is greater on the left. Anterior circulation: The right ICA siphon is reconstituted distally and stable in appearance since September 2019. There is a distal siphon stent  which is patent. The right ACA and MCA origins are patent without  stenosis, but there is asymmetrically decreased right ACA and MCA enhancement. Still, the right MCA bifurcation is patent and right MCA branches appear stable since September. The anterior communicating artery is normal, but the right ACA remains hypoenhancing compared to the left. No ACA branch occlusion identified. Left ICA siphon is patent with moderate calcified plaque resulting in mild left siphon stenosis. Normal left ICA terminus, left MCA and ACA origin. Normal left A1. Left MCA M1, bifurcation, and left MCA branches are within normal limits. Venous sinuses: Not evaluated due to early contrast timing. Anatomic variants: Dominant left vertebral artery. Review of the MIP images confirms the above findings IMPRESSION: 1. Negative for emergent large vessel occlusion. 2. Stable occluded cervical Right ICA stent and suboptimal reconstitution of the distal Right ICA terminus. The right ACA and MCA remain patent although with decreased enhancement compared to the left anterior circulation. 3. Left carotid, left anterior circulation and posterior circulation are stable since September. Notable for: - Left carotid calcified plaque without significant stenosis. - multifocal bilateral vertebral artery plaque with mild to moderate right and mild left vertebral artery stenosis. 4. Stable non contrast CT appearance of the brain since 1456 hours today. 5.  Aortic Atherosclerosis (ICD10-I70.0). Salient findings were communicated to Dr. Lorraine Lax at 3:34 pm on 03/25/2018 by text page via the [facility] messaging system. Electronically Signed   By: Genevie Ann M.D.   On: 03/25/2018 15:35   Mr Brain Wo Contrast (neuro Protocol)  Result Date: 03/25/2018 CLINICAL DATA:  Left arm weakness and facial droop. EXAM: MRI HEAD WITHOUT CONTRAST TECHNIQUE: Multiplanar, multiecho pulse sequences of the brain and surrounding structures were obtained without intravenous contrast. COMPARISON:  Head CT and CTA 03/25/2018 FINDINGS: BRAIN: There is  abnormal diffusion restriction predominantly within the right anterior cerebral artery territory, with small foci of diffusion abnormality within the anterior portion of the right middle cerebral artery territory. The midline structures are normal. No midline shift or other mass effect. Confluent hyperintense T2-weighted signal within the right MCA corona radiata. There is an old posterior right parietal infarct. Multifocal white matter hyperintensity bilaterally otherwise. There is hyperintense T2-weighted signal also within the right midbrain and pons. There is generalized atrophy. Susceptibility-sensitive sequences show no chronic microhemorrhage or superficial siderosis. VASCULAR: Loss of the normal right ICA flow void. SKULL AND UPPER CERVICAL SPINE: Calvarial bone marrow signal is normal. There is no skull base mass. Visualized upper cervical spine and soft tissues are normal. SINUSES/ORBITS: Moderate left maxillary mucosal thickening. There are bilateral lens replacements. IMPRESSION: 1. Acute ischemia within the anterior right hemisphere, predominantly within the right ACA territory, with small foci also present in the MCA territory. 2. Loss of the normal right ICA flow void, consistent with known occlusion. 3. No hemorrhage or mass effect. 4. 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 Electronically Signed   By: Ulyses Jarred M.D.   On: 03/25/2018 18:37   Ct Head Code Stroke Wo Contrast  Result Date: 03/25/2018 CLINICAL DATA:  Code stroke.  Left sided paralysis. EXAM: CT HEAD WITHOUT CONTRAST TECHNIQUE: Contiguous axial images were obtained from the base of the skull through the vertex without intravenous contrast. COMPARISON:  01/12/2018 FINDINGS: Brain: Brainstem and cerebellum remain unremarkable. Left hemisphere shows atrophy with chronic small-vessel ischemic changes. Right hemisphere shows progressive right frontal cortical and subcortical low-density related to a regional infarction that  was acute in December of 2019. Older right posterior frontal cortical  and subcortical infarction appears the same. Chronic small-vessel ischemic changes appear the same. No sign of hyperacute insult. No hemorrhage, hydrocephalus or extra-axial collection. Vascular: There is atherosclerotic calcification of the major vessels at the base of the brain. Right carotid stent. Skull: No acute finding.  Previous left orbital reconstruction. Sinuses/Orbits: Clear except for chronic mucosal thickening of the left maxillary sinus. Orbits negative. Other: None ASPECTS ([place] Stroke Program Early CT Score) is problematic given the late subacute right frontal infarction. Allowing for that, no acute ischemia is identified. IMPRESSION: 1. No acute finding today. Late subacute infarction in the right frontal cortical and subcortical brain which was acute in December of 2019. Older right posterior frontal cortical and subcortical infarction. 2. ASPECTS is problematic because of the subacute right frontal infarction. 3. These results were communicated to Dr. Lorraine Lax at 3:12 pmon 2/14/2020by text page via the [facility] messaging system. Electronically Signed   By: Nelson Chimes M.D.   On: 03/25/2018 15:13     ASSESSMENT [name] is a 68 y.o. male with PMH CVA ( 2019) with residual left side deficits, HTN, HLD, DM2, CHF, CAD, ETOH abuse, COPD who presented to [facility] ed as a code stroke for left side paralysis and slurred speech. CTH: no hemorrhage. No TPA d./t patient being outside of window. CTA: no LVO, but chronic right ICA occlusion. Patient also known to have uncontrolled risk factors. Stroke Risk Factors - diabetes mellitus, hyperlipidemia, hypertension and smoking   PLAN :  He has presented with worsening of his left hemiplegia due to right ACA infarct likely from failure of collaterals circulation as well as cocaine related vasospasm. He is not a candidate for revascularization. Urine drug screen is positive for  cocaine. Recommend permissive hypertension and dual antiplatelet therapy. Physical occupational therapy and rehabilitation consults. Patient counseled to quit cocaine.physical occupational therapy consults.He will likely need rehabilitation.  Continue aspirin and Plavix. Aggressive risk factor modification.    Greater than 50% time during this 35 minute visit was spent on counseling and coordination of care about his recurrent strokes and discuss need to make lifestyle modification and aggressive risk factor control and answered questions  Hospital day # [place], MD [place] for Schedule & Pager information 03/26/2018 12:24 PM    To contact Stroke Continuity provider, please refer to [email or web address] After hours, contact General Neurology

## 2018-03-26 NOTE — Progress Notes (Signed)
CSW acknowledges substance abuse consult. CSW went and met with the patient at bedside. Patient denies any substance use issues and refused resources.   CSW is signing off.   Domenic Schwab, MSW, [place]

## 2018-03-26 NOTE — Evaluation (Signed)
Occupational Therapy Evaluation Patient Details Name: [name] MRN: [id number] DOB: 1950-11-06 Today's Date: 03/26/2018    History of Present Illness Pt is a 68 y.o. male admitted 03/25/18 with L-side LE and face weakness; MRI showed acute ischemia in R ACA distribution with small foci in MCA. PMH includes prior stroke (residual LUE weakness), COPD, seizure disorder, DVT, CHF, polysubstance abuse, dementia. Of note, recent admission 01/2018 with R MCA infarct with d/c to CIR.   Clinical Impression   PTA, pt was living at home alone and required assistance for ADLs. Pt reporting he used to have an aide, but she did not return after his most recent admission. Pt currently requiring Mod A for UB ADLs, Max A for LB ADLs, and Mod A +2 for functional mobility with SPC. Pt presenting with poor balance, strength, cognition, and functional use of LUE (compared to baseline residual deficits at LUE). Pt will require further acute OT to facilitate safe dc. Recommend dc to CIR for further OT to optimize safety, independence with ADLs, and return to PLOF.      Follow Up Recommendations  CIR;Supervision/Assistance - 24 hour    Equipment Recommendations  Other (comment)(Defer to next venue)    Recommendations for Other Services Rehab consult;PT consult;Speech consult     Precautions / Restrictions Precautions Precautions: Fall Restrictions Weight Bearing Restrictions: No      Mobility Bed Mobility               General bed mobility comments: Received sitting in recliner  Transfers Overall transfer level: Needs assistance Equipment used: Straight cane Transfers: Sit to/from Stand Sit to Stand: Mod assist;+2 safety/equipment         General transfer comment: Pt with no awareness of correct LLE positioning prior to standing. ModA to assist trunk elevation on R-side, totalA to reposition LLE and assist to block L knee buckling    Balance Overall balance assessment: Needs  assistance   Sitting balance-Leahy Scale: Fair       Standing balance-Leahy Scale: Poor Standing balance comment: Very poor balance strategies/postural reactions with minimal challenge                           ADL either performed or assessed with clinical judgement   ADL Overall ADL's : Needs assistance/impaired Eating/Feeding: Minimal assistance;Sitting Eating/Feeding Details (indicate cue type and reason): Min A for bilateral tasks and set up Grooming: Minimal assistance;Sitting Grooming Details (indicate cue type and reason): Min A for bilateral tasks Upper Body Bathing: Moderate assistance;Sitting   Lower Body Bathing: Maximal assistance   Upper Body Dressing : Moderate assistance;Sitting   Lower Body Dressing: Maximal assistance;Sit to/from stand   Toilet Transfer: Moderate assistance;+2 for safety/equipment;Ambulation(SPC) Toilet Transfer Details (indicate cue type and reason): Simulated to recliner with Mod A +2 for mobility and balance         Functional mobility during ADLs: Moderate assistance;+2 for physical assistance;+2 for safety/equipment;Cane General ADL Comments: Decreased balance, strength, activity tolerance, and use of LUE     Vision         Perception     Praxis      Pertinent Vitals/Pain Pain Assessment: No/denies pain     Hand Dominance Right   Extremity/Trunk Assessment Upper Extremity Assessment Upper Extremity Assessment: LUE deficits/detail LUE Deficits / Details: Decreased active movement. Limted PROM at hand, wrist, elbow, and shoulder. Increased tone and edema LUE Coordination: decreased fine motor;decreased gross motor   Lower  Extremity Assessment Lower Extremity Assessment: Defer to PT evaluation LLE Deficits / Details: L knee ext 4/5, hip flex 3/5, hip ext <3/5, knee flex <3/5, ankle DF 1/5 LLE Coordination: decreased gross motor;decreased fine motor       Communication Communication Communication: No  difficulties   Cognition Arousal/Alertness: Awake/alert Behavior During Therapy: WFL for tasks assessed/performed Overall Cognitive Status: History of cognitive impairments - at baseline Area of Impairment: Attention;Memory;Following commands;Safety/judgement;Awareness;Problem solving                   Current Attention Level: Selective Memory: Decreased short-term memory Following Commands: Follows one step commands inconsistently;Follows one step commands with increased time Safety/Judgement: Decreased awareness of deficits;Decreased awareness of safety Awareness: Emergent Problem Solving: Slow processing;Requires verbal cues;Requires tactile cues General Comments: H/o dementia per chart. A&O x4. Slowed processing   General Comments  Motivated to participate in therapy    Exercises     Shoulder Instructions      Home Living Family/patient expects to be discharged to:: Private residence Living Arrangements: Alone Available Help at Discharge: Personal care attendant;Available PRN/intermittently Type of Home: Apartment Home Access: Elevator     Home Layout: One level     Bathroom Shower/Tub: Teacher, early years/pre: Handicapped height Bathroom Accessibility: Yes   Home Equipment: Cane - single point;Shower seat;Walker - 4 wheels;Grab bars - tub/shower;Bedside commode;Wheelchair - power   Additional Comments: Power wheelchair has not been charged for 4 months      Prior Functioning/Environment Level of Independence: Needs assistance  Gait / Transfers Assistance Needed: Ambulatory with [place] ADL's / Homemaking Assistance Needed: Unclear why pt does not have continued aide assist; aide used to help with household tasks, ADLs and transportation Communication / Swallowing Assistance Needed: HOH          OT Problem List: Decreased strength;Decreased range of motion;Decreased activity tolerance;Impaired balance (sitting and/or standing);Decreased safety  awareness;Decreased knowledge of use of DME or AE;Decreased knowledge of precautions;Impaired UE functional use;Increased edema;Decreased cognition;Decreased coordination      OT Treatment/Interventions: Self-care/ADL training;Therapeutic exercise;Energy conservation;DME and/or AE instruction;Therapeutic activities;Patient/family education    OT Goals(Current goals can be found in the care plan section) Acute Rehab OT Goals Patient Stated Goal: Agreeble to post-acute rehab  OT Goal Formulation: With patient Time For Goal Achievement: 04/09/18 Potential to Achieve Goals: Good  OT Frequency: Min 2X/week   Barriers to D/C:            Co-evaluation PT/OT/SLP Co-Evaluation/Treatment: Yes Reason for Co-Treatment: Complexity of the patient's impairments (multi-system involvement);Necessary to address cognition/behavior during functional activity;To address functional/ADL transfers;For patient/therapist safety PT goals addressed during session: Mobility/safety with mobility;Balance;Proper use of DME OT goals addressed during session: ADL's and self-care      AM-PAC OT "6 Clicks" Daily Activity     Outcome Measure Help from another person eating meals?: A Little Help from another person taking care of personal grooming?: A Little Help from another person toileting, which includes using toliet, bedpan, or urinal?: A Lot Help from another person bathing (including washing, rinsing, drying)?: A Lot Help from another person to put on and taking off regular upper body clothing?: A Lot Help from another person to put on and taking off regular lower body clothing?: A Lot 6 Click Score: 14   End of Session Equipment Utilized During Treatment: Gait belt;Other (comment)(SPC) Nurse Communication: Mobility status  Activity Tolerance: Patient tolerated treatment well Patient left: in chair;with call bell/phone within reach;with chair alarm set  OT  Visit Diagnosis: Unsteadiness on feet  (R26.81);Other abnormalities of gait and mobility (R26.89);Muscle weakness (generalized) (M62.81);Other symptoms and signs involving cognitive function;Hemiplegia and hemiparesis Hemiplegia - Right/Left: Left Hemiplegia - dominant/non-dominant: Non-Dominant Hemiplegia - caused by: Cerebral infarction                Time: 1898-4210 OT Time Calculation (min): 19 min Charges:  OT General Charges $OT Visit: 1 Visit OT Evaluation $OT Eval Moderate Complexity: 1 Mod  Cystal Shannahan MSOT, OTR/L Acute Rehab Pager: [phone] Office: [place] 03/26/2018, 1:49 PM

## 2018-03-26 NOTE — Progress Notes (Signed)
SLP Cancellation Note  Patient Details Name: [name] MRN: [id number] DOB: [birth year]   Cancelled treatment:       Reason Eval/Treat Not Completed: SLP screened, no needs identified, will sign off. SLP screened for swallow needs. Pt passed [place] and is currently on dysphagia 3 (soft) diet with thin liquids. Soft diet ordered as pt is edentulous, per RN. She states he is tolerating it well. May continue dys 3/thin liquids, SLP will follow up for cognitive-linguistic assessment.  Deneise Lever, [place], CCC-SLP Speech-Language Pathologist Acute Rehabilitation Services Pager: [phone] Office: [phone]    Aliene Altes 03/26/2018, 3:38 PM

## 2018-03-26 NOTE — Progress Notes (Signed)
Rehab Admissions Coordinator Note:  Patient was screened by Cleatrice Burke for appropriateness for an Inpatient Acute Rehab Consult per PT recommendation. Pt was previously at [facility] 01/2018 for 17 days and then d/c'd to SNF . At this time, we are recommending [facility].  Cleatrice Burke 03/26/2018, 1:15 PM  I can be reached at [phone].

## 2018-03-27 ENCOUNTER — Inpatient Hospital Stay (HOSPITAL_COMMUNITY): Payer: Medicare Other

## 2018-03-27 DIAGNOSIS — I639 Cerebral infarction, unspecified: Secondary | ICD-10-CM

## 2018-03-27 LAB — CBC
HEMATOCRIT: 32.1 % — AB (ref 39.0–52.0)
Hemoglobin: 9.6 g/dL — ABNORMAL LOW (ref 13.0–17.0)
MCH: 25.1 pg — ABNORMAL LOW (ref 26.0–34.0)
MCHC: 29.9 g/dL — ABNORMAL LOW (ref 30.0–36.0)
MCV: 84 fL (ref 80.0–100.0)
Platelets: 176 10*3/uL (ref 150–400)
RBC: 3.82 MIL/uL — ABNORMAL LOW (ref 4.22–5.81)
RDW: 14.6 % (ref 11.5–15.5)
WBC: 4.7 10*3/uL (ref 4.0–10.5)
nRBC: 0 % (ref 0.0–0.2)

## 2018-03-27 LAB — BASIC METABOLIC PANEL
Anion gap: 5 (ref 5–15)
BUN: 8 mg/dL (ref 8–23)
CO2: 26 mmol/L (ref 22–32)
CREATININE: 0.73 mg/dL (ref 0.61–1.24)
Calcium: 9.3 mg/dL (ref 8.9–10.3)
Chloride: 111 mmol/L (ref 98–111)
GFR calc Af Amer: >60 mL/min (ref >60)
GFR calc non Af Amer: >60 mL/min (ref >60)
Glucose, Bld: 106 mg/dL — ABNORMAL HIGH (ref 70–99)
Potassium: 4.2 mmol/L (ref 3.5–5.1)
Sodium: 142 mmol/L (ref 135–145)

## 2018-03-27 LAB — ECHOCARDIOGRAM COMPLETE
Height: 72 in
Weight: 3580.27 oz

## 2018-03-27 NOTE — Progress Notes (Signed)
Family Medicine Teaching Service Daily Progress Note Intern Pager: [phone]  Patient name: [name] Medical record number: [id number] Date of birth: 09/28/1950 Age: 68 y.o. Gender: male  Primary Care Provider: Charlott Rakes, MD Consultants: Neuro Code Status: DNR  Pt Overview and Major Events to Date:  2/14 - admitted for new onset stroke  Assessment and Plan: [name] is a 68 y.o. male presenting with a one day history of left sided leg and face weakness (pre-existing left arm weakness) consistent with acute ischemic stroke in the right anterior hemisphere. PMH is significant for multiple CVA, DVT, CHF, polysubstance use disorder, dementia, GERD, Gout.    Acute Stroke with left sided weakness, improving - MRI with acute ischemia within the anterior right hemisphere in the right ACA distribution with a small foci present in the MCA consistent with presentation. Dys3 per SLP.  Left limb muscle strength 3-4 out of 5 which patient says is baseline.  Has left-sided facial drooping which patient says is new to this episode.  Encouraged smoking cessation, patient declined substance abuse resources with SW.  Risk strat labs unremarkable.  Patient counseled about the refusal for substance abuse resources, they still refuse despite discussion about cocaine contributing to their condition. - neuro consult, appreciate recs - f/u Echo pending - permissive HTN  -DAPT - PT recommended snf/cir, cir turned down, SW consult placed for SNF - tylenol PRN for pain  Left sided abdominal pain, non-acute- resolved Had complained about onset about a week ago, constant ache, no progression since onset, called his PCP about it on 2/10/20but did not go to clinic, no UGI sxs, hard BMs but is passing stool, no dysuria. (+) urgency with incontinence which is not new. No cough, no fever. NO indigestion. No change with or without food.  Lipase/KUB/UA normal.  Broad differential had included for this  nonspecific compalint: Abdominal wall pain, pancreatitis (History of pancreatitis in 2011), pleurisy, obstipation, diverticulitis, ureteral stone, pyelo, pud, etc.. - CXR pending -US showed mildly dilated AAA (f/u in 13yrs placed in discharge summary)  COPD, stable - room air, patient takes dulera BID at home. Endorses constant cough. CTAB with upper airway noises transmitted on exm. - continue home med  Hx of seizure disorder. - Noted on previous hospital followup in December.  He takes keppra 500 mg BID at home.  - continue home [place].   HTN - Allowing for permissive HTN, mostly normotensive.  Patient is on Coreg at home --PRN hydralazine for systolic greater than 637 and diastolic greater than 858.  Given patient recently use of crack cocaine will avoid beta-blocker.  GERD - stable patient takes pantoprazole 40mg  at home.   - continue home meds  Gout - no current flare. patient takes allopurinol 300mg  at home.   - continue home meds  CHF - stable.  Patient takes carvedilol 3.125mg  BID. Will currently hold given cocaine use.   - Follow-up for repeat echo  Hx of DVT - DVT listed in problem list.  Is not on anticoagulation currently because he is a fall risk.  Patient is currently on Plavix and aspirin which is increases risk of bleeding.  He has a history of paroxysmal atrial fibrillation with elevated chads-VASC score.   Alcohol abuse - CIWA 5<2<0<0.  patient states he only drinks one beer a month.  EtOH neg on admission. - monitor CIWA scores for withdrawal, ativan as needed per protocol  Tobacco use disorder -  Patient smokes ten cigars daily, though he states  they are small cigars.   - nicotine patch 21mg .   Substance use disorder - patient admitted to smoking crack cocaine regularly on admission. UDS + cocaine. - social work consult   Dementia - likely vascular dementia from his history of CVA. Patient takes namenda at home.  A&Ox3 this am. - continue home  meds  Normocytic Anemia Hb 9.9 on admission,  Has been persistently ~11 since 01/2018. Alcohol use may contribute although prior to 01/2018 was wnl. No evidence of bleeding on exam. - consider obtaining ferritin, iron panel if clinically concerned  FEN/GI: DYS 3 diet  Prophylaxis: lovenox  Disposition: continue inpatient management for stroke, CIR declined, SNF consult with SW placed, may d/c once workup completed and neuro signs off  Subjective:  Patient said that his left limb strength had returned to baseline.  He did think that his left-sided facial droop was new to this episode.  Denied any pain and did say that he was willing to go to physical rehab although he was declining substance abuse resources.  Objective: Temp:  [97.5 F (36.4 C)-99 F (37.2 C)] 98.4 F (36.9 C) (02/16 0344) Pulse Rate:  [62-80] 76 (02/16 0344) Resp:  [18-20] 18 (02/16 0344) BP: (128-163)/(58-74) 137/60 (02/16 0344) SpO2:  [94 %-99 %] 94 % (02/16 0344) Physical Exam: General: Awake and pleasant, conversing with minor slurring of speech, no distress Cardiovascular: Regular rate and rhythm, difficult to hear for murmurs given lung sounds Respiratory: No increased work of breathing on room air, good air movement throughout but there were upper airway referred sounds  Abdomen: Soft, obese, bowel sounds present, no tenderness to palpation Extremities: Well perfused MSK:  Right limbs 5 of 5 strength, left limbs 3-4 out of 5.  No edema. R grip strength intact, no grip strength on the left. Neuro: Alert and oriented, mild slurring.  Extraocular movements intact.  Intact symmetric sensation to light touch of face and extremities bilaterally.  Hearing grossly intact bilaterally.   Shoulder shrug diminished on L side.  Left side facial droop.  Laboratory: Recent Labs  Lab 03/25/18 1448 03/25/18 1924 03/26/18 0614  WBC 6.2 6.1 5.0  HGB 11.3* 11.0* 9.9*  HCT 39.3 36.5* 34.7*  PLT 223 214 183   Recent  Labs  Lab 03/25/18 1448 03/25/18 1453 03/25/18 1924 03/26/18 0614  NA 144  --   --  142  K 4.2  --   --  3.9  CL 110  --   --  110  CO2 25  --   --  26  BUN 11  --   --  8  CREATININE 0.92 0.80 0.79 0.62  CALCIUM 9.7  --   --  9.0  PROT 6.7  --   --   --   BILITOT 0.4  --   --   --   ALKPHOS 66  --   --   --   ALT 14  --   --   --   AST 19  --   --   --   GLUCOSE 88  --   --  97    Imaging/Diagnostic Tests: Dg Abd 1 View  Result Date: 03/26/2018 CLINICAL DATA:  Recurrent left upper quadrant abdominal pain. EXAM: ABDOMEN - 1 VIEW COMPARISON:  One-view pelvis 09/19/2017.  Abdominal CT 08/27/2012. FINDINGS: There is mild gaseous distention of the stomach. No significant small bowel or colonic distension or wall thickening identified. There is no supine evidence of free intraperitoneal air. Surgical  clips are present in the upper abdomen and both groins. Right iliac stent and diffuse vascular calcifications noted. IMPRESSION: No radiographic evidence of active abdominal process. Electronically Signed   By: Richardean Sale M.D.   On: 03/26/2018 14:58   US Abdomen Complete  Result Date: 03/26/2018 CLINICAL DATA:  Recurrent abdominal pain. Recent abdominal pain. Cholecystectomy. EXAM: ABDOMEN ULTRASOUND COMPLETE COMPARISON:  None. FINDINGS: Gallbladder: Surgically absent. Common bile duct: Diameter: 2.1 Liver: Increased echogenicity in the liver with no focal mass. Portal vein is patent on color Doppler imaging with normal direction of blood flow towards the liver. IVC: No abnormality visualized. Pancreas: Not visualized due to shadowing bowel gas. Spleen: Size and appearance within normal limits. Right Kidney: Length: 10.5 cm. Echogenicity within normal limits. No mass or hydronephrosis visualized. Left Kidney: Length: 12.5 cm. Echogenicity within normal limits. No mass or hydronephrosis visualized. Abdominal aorta: Measures 3.2 cm proximally. Other findings: None. IMPRESSION: 1. Mild  aneurysmal dilatation of the abdominal aorta, measuring 3.2 cm proximally. Recommend followup by ultrasound in 3 years. This recommendation follows ACR consensus guidelines: White Paper of the ACR Incidental Findings Committee II on Vascular Findings. J Am Coll Radiol 2013; 28:786-767 2. Probable hepatic steatosis. Electronically Signed   By: Dorise Bullion III M.D   On: 03/26/2018 17:35   Sherene Sires, DO 03/27/2018, 4:47 AM PGY-2, [place] Intern pager: [phone], text pages welcome

## 2018-03-27 NOTE — Plan of Care (Signed)
Patient stable, discussed POC with patient, agreeable with plan, denies question/concerns at this time.  

## 2018-03-27 NOTE — Progress Notes (Signed)
  Echocardiogram 2D Echocardiogram has been performed.  Johny Chess 03/27/2018, 1:46 PM

## 2018-03-27 NOTE — Discharge Summary (Signed)
[facility] Discharge Summary  Patient name: [name] Medical record number: [id number] Date of birth: Jul 01, 1950 Age: 68 y.o. Gender: male Date of Admission: 03/25/2018  Date of Discharge: 03/30/2018 Admitting Physician: Leeanne Rio, MD  Primary Care Provider: Charlott Rakes, MD Consultants: Neurology  Indication for Hospitalization: Acute ischemic stroke  Discharge Diagnoses/Problem List:  Acute ischemic stroke of right anterior hemisphere History of CVA Hypertension COPD History of seizure disorder DVT CHF Polysubstance use disorder - alcohol, tobacco, cocaine Dementia, vascular GERD Gout  Disposition: Discharged to SNF  Discharge Condition: Improved, stable  Discharge Exam:  Physical Exam  Constitutional: He is well-developed, well-nourished, and in no distress. No distress.  Eyes: EOM are normal.  Cardiovascular: Normal rate and regular rhythm. Exam reveals friction rub. Exam reveals no gallop.  No murmur heard. Pulmonary/Chest: Effort normal. No respiratory distress.  Coarse breath sounds throughout, stable  Abdominal: Bowel sounds are normal. He exhibits no distension. There is no abdominal tenderness.  Neurological: He is alert. Coordination normal.  Residual L facial droop; Strength in right upper and lower extremity with extension and flexion 5/5; 4/5 in left lower extremity; 3/5 in left upper extremity   Brief Hospital Course:  [name] is a 68 year old male admitted from home when he was noted to have worsening left-sided arm and leg weakness that started the night before admission.  The patient was noted to be smoking crack earlier on the day his leg started to go numb and he does not believe the 2 events are related.  Patient was also noted to have left facial droop and slurred speech on admission.  Code stroke was called.  CT head in the ED was noted to have evidence of old infarction without hemorrhage and  CTA showed no acute large vessel occlusion, so he was not an IR candidate.  Neurology recommended permissive hypertension through 04/01/2018 and dual antiplatelet therapy with Plavix and aspirin.  An echo was performed 2/16 and showed normal ejection fraction without wall motion abnormalities or cardiac source of embolism.    Due to complaint of left-sided abdominal pain, an ultrasound of the patient's abdomen was performed and revealed AAA.  It was recommended that patient pursue ultrasound follow-up for this in 3 years, February 2023.  Chest x-ray was performed and revealed 17 mm right lung nodule.  CT of the chest was performed and suggested the 2.0 cm pulmonary nodule of the right upper lobe was highly suspicious for a malignancy.  The patient was informed of this finding and concluded he wanted to address his issues with his stroke before pursuing treatment of the lung nodule.  Physical therapy worked with the patient recommended CIR.  The patient was declined from CIR; however, he was accepted at [facility] and sent to [facility] at discharge.      Issues for Follow Up:  1. 70yr [place] for followup of AAA recommended (February 2023). 2. A lesion suspicious for malignancy was found in upper lobe of right lung.  Patient is encouraged to follow-up with multidisciplinary thoracic oncology clinic for further work-up and management.  This clinic has already been contacted and given the patient's information. 3. Permissive hypertension is allowed through April 01, 2018.  After which, patient should take his usual blood pressure medications. 4. Neurology recommends DAPT with aspirin 325 mg and Plavix 75 mg.  They did not state the duration of this treatment, but this usually lasts for 21 (April 15, 2018) to 90 days (Jun 17, 2018). 5. Avoid beta-blockers for treatment of hypertension due to crack cocaine use!  The patient's Coreg has been discontinued.     Significant Procedures: None  Significant Labs  and Imaging:  Recent Labs  Lab 03/28/18 0616 03/29/18 0515 03/30/18 0417  WBC 5.2 5.2 5.7  HGB 10.2* 9.9* 9.9*  HCT 34.5* 34.0* 34.0*  PLT 170 159 187   Recent Labs  Lab 03/25/18 1448 03/25/18 1453 03/25/18 1924 03/26/18 0614 03/27/18 0811 03/28/18 0616  NA 144  --   --  142 142 143  K 4.2  --   --  3.9 4.2 4.1  CL 110  --   --  110 111 110  CO2 25  --   --  26 26 26   GLUCOSE 88  --   --  97 106* 111*  BUN 11  --   --  8 8 9   CREATININE 0.92 0.80 0.79 0.62 0.73 0.80  CALCIUM 9.7  --   --  9.0 9.3 9.2  ALKPHOS 66  --   --   --   --   --   AST 19  --   --   --   --   --   ALT 14  --   --   --   --   --   ALBUMIN 3.6  --   --   --   --   --    Ct Angio Head W Or Wo Contrast  Result Date: 03/25/2018 CLINICAL DATA:  68 year old male code stroke presentation with left side paralysis. Occluded right ICA and right frontal lobe infarct in December. EXAM: CT ANGIOGRAPHY HEAD AND NECK TECHNIQUE: Multidetector CT imaging of the head and neck was performed using the standard protocol during bolus administration of intravenous contrast. Multiplanar CT image reconstructions and MIPs were obtained to evaluate the vascular anatomy. Carotid stenosis measurements (when applicable) are obtained utilizing NASCET criteria, using the distal internal carotid diameter as the denominator. CONTRAST:  34mL ISOVUE-370 IOPAMIDOL (ISOVUE-370) INJECTION 76% COMPARISON:  Plain head CT earlier today. Cerebral angiogram 01/14/2018. brain MRI 01/13/2018. CTA 10/25/2017. FINDINGS: CT HEAD Brain: Stable gray-white matter differentiation throughout the brain. Patchy right MCA hypodensity appears stable from earlier today. No acute intracranial hemorrhage identified. No midline shift, mass effect, or evidence of intracranial mass lesion. No ventriculomegaly. Calvarium and skull base: Stable. Paranasal sinuses: Chronic left maxillary sinusitis. Orbits: No acute finding.  Previous left orbital floor ORIF. CTA NECK Skeleton:  Absent dentition. C2-C3 ankylosis versus incomplete segmentation. No acute osseous abnormality identified. Upper chest: Stable since September and negative. Other neck: Stable and negative. Aortic arch: Extensive Calcified aortic atherosclerosis. Three vessel arch configuration. Right carotid system: Bulky plaque in the brachiocephalic artery appears stable the right CCA origin remains patent. The right CCA is patent to the level of the right ICA stent, but the stent is occluded as before. No reconstituted flow in the cervical right ICA. Left carotid system: Soft and calcified left CCA origin plaque appears stable with less than 50 % stenosis with respect to the distal vessel. Multifocal calcified plaque at the left carotid bifurcation and continuing to the left ICA bulb with less than 50 % stenosis with respect to the distal vessel. The left ICA remains patent to the skull base. Vertebral arteries: The right subclavian artery origin remains patent despite plaque. Stable calcified plaque at the right vertebral artery origin resulting in mild to moderate stenosis. Stable right V1 and V3 calcified plaque with mild  stenosis. The right vertebral is non dominant and patent to the skull base. Stable proximal left subclavian artery plaque without significant stenosis. The left vertebral artery is dominant. Calcified plaque at the left vertebral artery origin results in mild stenosis and appears stable. (Series 10, image 111). Intermittent left V2 and V3 calcified plaque with mild stenosis is stable. The left vertebral remains patent to the skull base, and the left PICA origin is early and patent. CTA HEAD Posterior circulation: Fairly codominant distal vertebral arteries without stenosis. Patent PICA origins and vertebrobasilar junction. Patent basilar artery without stenosis. Normal basilar tip, SCA and PCA origins. Posterior communicating arteries are diminutive or absent. Bilateral PCA branches are normal aside from  mild irregularity which is greater on the left. Anterior circulation: The right ICA siphon is reconstituted distally and stable in appearance since September 2019. There is a distal siphon stent which is patent. The right ACA and MCA origins are patent without stenosis, but there is asymmetrically decreased right ACA and MCA enhancement. Still, the right MCA bifurcation is patent and right MCA branches appear stable since September. The anterior communicating artery is normal, but the right ACA remains hypoenhancing compared to the left. No ACA branch occlusion identified. Left ICA siphon is patent with moderate calcified plaque resulting in mild left siphon stenosis. Normal left ICA terminus, left MCA and ACA origin. Normal left A1. Left MCA M1, bifurcation, and left MCA branches are within normal limits. Venous sinuses: Not evaluated due to early contrast timing. Anatomic variants: Dominant left vertebral artery. Review of the MIP images confirms the above findings IMPRESSION: 1. Negative for emergent large vessel occlusion. 2. Stable occluded cervical Right ICA stent and suboptimal reconstitution of the distal Right ICA terminus. The right ACA and MCA remain patent although with decreased enhancement compared to the left anterior circulation. 3. Left carotid, left anterior circulation and posterior circulation are stable since September. Notable for: - Left carotid calcified plaque without significant stenosis. - multifocal bilateral vertebral artery plaque with mild to moderate right and mild left vertebral artery stenosis. 4. Stable non contrast CT appearance of the brain since 1456 hours today. 5.  Aortic Atherosclerosis (ICD10-I70.0). Salient findings were communicated to Dr. Lorraine Lax at 3:34 pm on 03/25/2018 by text page via the [facility] messaging system. Electronically Signed   By: Genevie Ann M.D.   On: 03/25/2018 15:35   Dg Chest 2 View  Result Date: 03/27/2018 CLINICAL DATA:  Leg and face weakness. EXAM: CHEST  - 2 VIEW COMPARISON:  Multiple chest x-rays since May 29, 2016 FINDINGS: Again noted is a nodule in the right mid lung measuring up to 17 mm. CT is been recommended previously but I see no evidence 1 has been performed. No other nodules or masses. Probable atelectasis in the left base. No other interval changes or acute abnormalities. IMPRESSION: 1. There is a 17 mm nodule in the right mid lung. Recommend a CT scan for further characterization. 2. Opacity in the left base favored represent atelectasis. 3. No other acute abnormalities. Electronically Signed   By: Dorise Bullion III M.D   On: 03/27/2018 14:34   Dg Abd 1 View  Result Date: 03/26/2018 CLINICAL DATA:  Recurrent left upper quadrant abdominal pain. EXAM: ABDOMEN - 1 VIEW COMPARISON:  One-view pelvis 09/19/2017.  Abdominal CT 08/27/2012. FINDINGS: There is mild gaseous distention of the stomach. No significant small bowel or colonic distension or wall thickening identified. There is no supine evidence of free intraperitoneal air. Surgical clips are  present in the upper abdomen and both groins. Right iliac stent and diffuse vascular calcifications noted. IMPRESSION: No radiographic evidence of active abdominal process. Electronically Signed   By: Richardean Sale M.D.   On: 03/26/2018 14:58   Ct Angio Neck W Or Wo Contrast  Result Date: 03/25/2018 CLINICAL DATA:  68 year old male code stroke presentation with left side paralysis. Occluded right ICA and right frontal lobe infarct in December. EXAM: CT ANGIOGRAPHY HEAD AND NECK TECHNIQUE: Multidetector CT imaging of the head and neck was performed using the standard protocol during bolus administration of intravenous contrast. Multiplanar CT image reconstructions and MIPs were obtained to evaluate the vascular anatomy. Carotid stenosis measurements (when applicable) are obtained utilizing NASCET criteria, using the distal internal carotid diameter as the denominator. CONTRAST:  22mL ISOVUE-370  IOPAMIDOL (ISOVUE-370) INJECTION 76% COMPARISON:  Plain head CT earlier today. Cerebral angiogram 01/14/2018. brain MRI 01/13/2018. CTA 10/25/2017. FINDINGS: CT HEAD Brain: Stable gray-white matter differentiation throughout the brain. Patchy right MCA hypodensity appears stable from earlier today. No acute intracranial hemorrhage identified. No midline shift, mass effect, or evidence of intracranial mass lesion. No ventriculomegaly. Calvarium and skull base: Stable. Paranasal sinuses: Chronic left maxillary sinusitis. Orbits: No acute finding.  Previous left orbital floor ORIF. CTA NECK Skeleton: Absent dentition. C2-C3 ankylosis versus incomplete segmentation. No acute osseous abnormality identified. Upper chest: Stable since September and negative. Other neck: Stable and negative. Aortic arch: Extensive Calcified aortic atherosclerosis. Three vessel arch configuration. Right carotid system: Bulky plaque in the brachiocephalic artery appears stable the right CCA origin remains patent. The right CCA is patent to the level of the right ICA stent, but the stent is occluded as before. No reconstituted flow in the cervical right ICA. Left carotid system: Soft and calcified left CCA origin plaque appears stable with less than 50 % stenosis with respect to the distal vessel. Multifocal calcified plaque at the left carotid bifurcation and continuing to the left ICA bulb with less than 50 % stenosis with respect to the distal vessel. The left ICA remains patent to the skull base. Vertebral arteries: The right subclavian artery origin remains patent despite plaque. Stable calcified plaque at the right vertebral artery origin resulting in mild to moderate stenosis. Stable right V1 and V3 calcified plaque with mild stenosis. The right vertebral is non dominant and patent to the skull base. Stable proximal left subclavian artery plaque without significant stenosis. The left vertebral artery is dominant. Calcified plaque at the  left vertebral artery origin results in mild stenosis and appears stable. (Series 10, image 111). Intermittent left V2 and V3 calcified plaque with mild stenosis is stable. The left vertebral remains patent to the skull base, and the left PICA origin is early and patent. CTA HEAD Posterior circulation: Fairly codominant distal vertebral arteries without stenosis. Patent PICA origins and vertebrobasilar junction. Patent basilar artery without stenosis. Normal basilar tip, SCA and PCA origins. Posterior communicating arteries are diminutive or absent. Bilateral PCA branches are normal aside from mild irregularity which is greater on the left. Anterior circulation: The right ICA siphon is reconstituted distally and stable in appearance since September 2019. There is a distal siphon stent which is patent. The right ACA and MCA origins are patent without stenosis, but there is asymmetrically decreased right ACA and MCA enhancement. Still, the right MCA bifurcation is patent and right MCA branches appear stable since September. The anterior communicating artery is normal, but the right ACA remains hypoenhancing compared to the left. No ACA branch  occlusion identified. Left ICA siphon is patent with moderate calcified plaque resulting in mild left siphon stenosis. Normal left ICA terminus, left MCA and ACA origin. Normal left A1. Left MCA M1, bifurcation, and left MCA branches are within normal limits. Venous sinuses: Not evaluated due to early contrast timing. Anatomic variants: Dominant left vertebral artery. Review of the MIP images confirms the above findings IMPRESSION: 1. Negative for emergent large vessel occlusion. 2. Stable occluded cervical Right ICA stent and suboptimal reconstitution of the distal Right ICA terminus. The right ACA and MCA remain patent although with decreased enhancement compared to the left anterior circulation. 3. Left carotid, left anterior circulation and posterior circulation are stable  since September. Notable for: - Left carotid calcified plaque without significant stenosis. - multifocal bilateral vertebral artery plaque with mild to moderate right and mild left vertebral artery stenosis. 4. Stable non contrast CT appearance of the brain since 1456 hours today. 5.  Aortic Atherosclerosis (ICD10-I70.0). Salient findings were communicated to Dr. Lorraine Lax at 3:34 pm on 03/25/2018 by text page via the [facility] messaging system. Electronically Signed   By: Genevie Ann M.D.   On: 03/25/2018 15:35   Ct Chest W Contrast  Result Date: 03/28/2018 CLINICAL DATA:  Follow-up lung nodule suspected on chest radiograph EXAM: CT CHEST WITH CONTRAST TECHNIQUE: Multidetector CT imaging of the chest was performed during intravenous contrast administration. CONTRAST:  67mL OMNIPAQUE IOHEXOL 300 MG/ML  SOLN COMPARISON:  Chest radiograph, 03/27/2018 FINDINGS: Cardiovascular: Cardiomegaly with 3 vessel coronary artery calcifications. No pericardial effusion. There is severe aortic atherosclerosis with approximately 50% mixed atherosclerotic stenosis of the origin of the brachiocephalic artery (series 3, image 37, series 5, image 73). Mediastinum/Nodes: No enlarged mediastinal, hilar, or axillary lymph nodes. Thyroid gland, trachea, and esophagus demonstrate no significant findings. Lungs/Pleura: There is a lobulated 2.0 cm pulmonary nodule of the right upper lobe with subtle adjacent spiculation and architectural distortion (series 7, image 56). There are additional small nonspecific pulmonary nodules present elsewhere, for example a 4 mm nodule of the right middle lobe (series 7, image 86). Bibasilar atelectasis or scarring. No pleural effusion or pneumothorax. Upper Abdomen: No acute abnormality. Musculoskeletal: No chest wall mass or suspicious bone lesions identified. IMPRESSION: 1. There is a lobulated 2.0 cm pulmonary nodule of the right upper lobe with subtle adjacent spiculation and architectural distortion (series 7,  image 56). This nodule is highly suspicious for malignancy. Given size and location, diagnostic options include PET-CT for metabolic characterization and percutaneous CT-guided biopsy. At minimum, recommend follow-up CT in 3 months to ensure stability. 2.  Coronary artery disease. 3. There is severe aortic atherosclerosis with approximately 50% mixed atherosclerotic stenosis of the origin of the brachiocephalic artery (series 3, image 37, series 5, image 73). Electronically Signed   By: Eddie Candle M.D.   On: 03/28/2018 14:50   Mr Brain Wo Contrast (neuro Protocol)  Result Date: 03/25/2018 CLINICAL DATA:  Left arm weakness and facial droop. EXAM: MRI HEAD WITHOUT CONTRAST TECHNIQUE: Multiplanar, multiecho pulse sequences of the brain and surrounding structures were obtained without intravenous contrast. COMPARISON:  Head CT and CTA 03/25/2018 FINDINGS: BRAIN: There is abnormal diffusion restriction predominantly within the right anterior cerebral artery territory, with small foci of diffusion abnormality within the anterior portion of the right middle cerebral artery territory. The midline structures are normal. No midline shift or other mass effect. Confluent hyperintense T2-weighted signal within the right MCA corona radiata. There is an old posterior right parietal infarct. Multifocal white matter  hyperintensity bilaterally otherwise. There is hyperintense T2-weighted signal also within the right midbrain and pons. There is generalized atrophy. Susceptibility-sensitive sequences show no chronic microhemorrhage or superficial siderosis. VASCULAR: Loss of the normal right ICA flow void. SKULL AND UPPER CERVICAL SPINE: Calvarial bone marrow signal is normal. There is no skull base mass. Visualized upper cervical spine and soft tissues are normal. SINUSES/ORBITS: Moderate left maxillary mucosal thickening. There are bilateral lens replacements. IMPRESSION: 1. Acute ischemia within the anterior right hemisphere,  predominantly within the right ACA territory, with small foci also present in the MCA territory. 2. Loss of the normal right ICA flow void, consistent with known occlusion. 3. No hemorrhage or mass effect. 4. 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 Electronically Signed   By: Ulyses Jarred M.D.   On: 03/25/2018 18:37   US Abdomen Complete  Result Date: 03/26/2018 CLINICAL DATA:  Recurrent abdominal pain. Recent abdominal pain. Cholecystectomy. EXAM: ABDOMEN ULTRASOUND COMPLETE COMPARISON:  None. FINDINGS: Gallbladder: Surgically absent. Common bile duct: Diameter: 2.1 Liver: Increased echogenicity in the liver with no focal mass. Portal vein is patent on color Doppler imaging with normal direction of blood flow towards the liver. IVC: No abnormality visualized. Pancreas: Not visualized due to shadowing bowel gas. Spleen: Size and appearance within normal limits. Right Kidney: Length: 10.5 cm. Echogenicity within normal limits. No mass or hydronephrosis visualized. Left Kidney: Length: 12.5 cm. Echogenicity within normal limits. No mass or hydronephrosis visualized. Abdominal aorta: Measures 3.2 cm proximally. Other findings: None. IMPRESSION: 1. Mild aneurysmal dilatation of the abdominal aorta, measuring 3.2 cm proximally. Recommend followup by ultrasound in 3 years. This recommendation follows ACR consensus guidelines: White Paper of the ACR Incidental Findings Committee II on Vascular Findings. J Am Coll Radiol 2013; [id number] 2. Probable hepatic steatosis. Electronically Signed   By: Dorise Bullion III M.D   On: 03/26/2018 17:35   Ct Head Code Stroke Wo Contrast  Result Date: 03/25/2018 CLINICAL DATA:  Code stroke.  Left sided paralysis. EXAM: CT HEAD WITHOUT CONTRAST TECHNIQUE: Contiguous axial images were obtained from the base of the skull through the vertex without intravenous contrast. COMPARISON:  01/12/2018 FINDINGS: Brain: Brainstem and cerebellum remain unremarkable. Left hemisphere  shows atrophy with chronic small-vessel ischemic changes. Right hemisphere shows progressive right frontal cortical and subcortical low-density related to a regional infarction that was acute in December of 2019. Older right posterior frontal cortical and subcortical infarction appears the same. Chronic small-vessel ischemic changes appear the same. No sign of hyperacute insult. No hemorrhage, hydrocephalus or extra-axial collection. Vascular: There is atherosclerotic calcification of the major vessels at the base of the brain. Right carotid stent. Skull: No acute finding.  Previous left orbital reconstruction. Sinuses/Orbits: Clear except for chronic mucosal thickening of the left maxillary sinus. Orbits negative. Other: None ASPECTS ([place] Stroke Program Early CT Score) is problematic given the late subacute right frontal infarction. Allowing for that, no acute ischemia is identified. IMPRESSION: 1. No acute finding today. Late subacute infarction in the right frontal cortical and subcortical brain which was acute in December of 2019. Older right posterior frontal cortical and subcortical infarction. 2. ASPECTS is problematic because of the subacute right frontal infarction. 3. These results were communicated to Dr. Lorraine Lax at 3:12 pmon 2/14/2020by text page via the [facility] messaging system. Electronically Signed   By: Nelson Chimes M.D.   On: 03/25/2018 15:13   Results/Tests Pending at Time of Discharge: None  Discharge Medications:  Allergies as of 03/30/2018  Reactions   Darvocet [propoxyphene N-acetaminophen] Hives   Haldol [haloperidol Decanoate] Hives   Acetaminophen Nausea Only   Upset stomach, tolerates Hydrocodone/APAP if taken with food   Metformin And Related Nausea And Vomiting   Norco [hydrocodone-acetaminophen] Nausea And Vomiting   Tolerates if taken with food      Medication List    STOP taking these medications   carvedilol 3.125 MG tablet Commonly known as:  COREG     TAKE  these medications   acetaminophen 325 MG tablet Commonly known as:  TYLENOL Take 2 tablets (650 mg total) by mouth every 4 (four) hours as needed for mild pain (or temp > 37.5 C (99.5 F)).   allopurinol 300 MG tablet Commonly known as:  ZYLOPRIM Take 1 tablet (300 mg total) by mouth daily.   aspirin 325 MG tablet Take 1 tablet (325 mg total) by mouth daily.   atorvastatin 80 MG tablet Commonly known as:  LIPITOR Take 1 tablet (80 mg total) by mouth daily at 6 PM.   baclofen 10 MG tablet Commonly known as:  LIORESAL Take 1 tablet (10 mg total) by mouth at bedtime.   camphor-menthol lotion Commonly known as:  SARNA Apply topically as needed for itching.   clopidogrel 75 MG tablet Commonly known as:  PLAVIX Take 1 tablet (75 mg total) by mouth daily.   diphenhydramine-acetaminophen 25-500 MG Tabs tablet Commonly known as:  TYLENOL PM Take 1 tablet by mouth at bedtime as needed.   DULoxetine 60 MG capsule Commonly known as:  CYMBALTA Take 1 capsule (60 mg total) by mouth daily.   folic acid 1 MG tablet Commonly known as:  FOLVITE Take 1 tablet (1 mg total) by mouth daily.   hydrOXYzine 25 MG tablet Commonly known as:  ATARAX/VISTARIL Take 1 tablet (25 mg total) by mouth every 8 (eight) hours as needed for itching.   levETIRAcetam 500 MG tablet Commonly known as:  KEPPRA Take 1 tablet (500 mg total) by mouth 2 (two) times daily.   memantine 10 MG tablet Commonly known as:  NAMENDA Take 1 tablet (10 mg total) by mouth 2 (two) times daily.   mometasone-formoterol 200-5 MCG/ACT Aero Commonly known as:  DULERA Inhale 2 puffs into the lungs 2 (two) times daily.   nicotine 21 mg/24hr patch Commonly known as:  NICODERM CQ - dosed in mg/24 hours Place 1 patch (21 mg total) onto the skin daily.   pantoprazole 40 MG tablet Commonly known as:  PROTONIX Take 1 tablet (40 mg total) by mouth daily.   pregabalin 150 MG capsule Commonly known as:  LYRICA Take 1 capsule  (150 mg total) by mouth 2 (two) times daily.   WIXELA INHUB 250-50 MCG/DOSE Aepb Generic drug:  Fluticasone-Salmeterol Inhale 1-2 puffs into the lungs daily.      Discharge Instructions: Please refer to Patient Instructions section of EMR for full details.  Patient was counseled important signs and symptoms that should prompt return to medical care, changes in medications, dietary instructions, activity restrictions, and follow up appointments.   Follow-Up Appointments:   Daisy Floro, DO 03/30/2018, 3:05 PM PGY-1, [place]

## 2018-03-27 NOTE — Progress Notes (Signed)
STROKE TEAM PROGRESS NOTE     SUBJECTIVE  he remains neurologically unchanged with persistent dense left hemiplegia. No other changes noted overnight.echocardiogram done today shows normal ejection fraction without any wall motion abnormalities of cardiac source of embolism. OBJECTIVE Vitals:   03/27/18 0749 03/27/18 1200  BP: 135/62 (!) 113/57  Pulse:  80  Resp: 18 18  Temp: 97.9 F (36.6 C) 98.2 F (36.8 C)  SpO2: 97% 96%     Physical Exam Obese middle-aged Caucasian male not in distress. . Afebrile. Head is nontraumatic. Neck is supple without bruit.    Cardiac exam no murmur or gallop. Lungs are clear to auscultation. Distal pulses are well felt.  Neurological Exam :  Awake alert oriented to time place and person. Mild dysarthria but can be easily understood. No aphasia. Follows commands well. Extraocular movements are full range without nystagmus. Blinks to threat bilaterally. Fundi not visualized. Vision acuity seems adequate. Mild left lower facial weakness. Tongue midline. Motor system exam reveals dense left hemiplegia with grade 1-2/5 strength in the upper and lower extremity. Normal strength in the right side. Touch pinprick sensation are preserved bilaterally. Vibration is normal. Deep tendon reflexes are brisker on the right than left. Both plantars are downgoing. Gait not tested.  Pertinent Laboratory Studies (past 3 days) / Diagnostics (past 24h) Recent Labs    03/25/18 1448  03/25/18 1924 03/26/18 0614 03/27/18 0811  WBC 6.2  --  6.1 5.0 4.7  HGB 11.3*  --  11.0* 9.9* 9.6*  PLT 223  --  214 183 176  NA 144  --   --  142 142  K 4.2  --   --  3.9 4.2  CREATININE 0.92   < > 0.79 0.62 0.73  GLUCOSE 88  --   --  97 106*   < > = values in this interval not displayed.    US Abdomen Complete  Result Date: 03/26/2018 CLINICAL DATA:  Recurrent abdominal pain. Recent abdominal pain. Cholecystectomy. EXAM: ABDOMEN ULTRASOUND COMPLETE COMPARISON:  None. FINDINGS:  Gallbladder: Surgically absent. Common bile duct: Diameter: 2.1 Liver: Increased echogenicity in the liver with no focal mass. Portal vein is patent on color Doppler imaging with normal direction of blood flow towards the liver. IVC: No abnormality visualized. Pancreas: Not visualized due to shadowing bowel gas. Spleen: Size and appearance within normal limits. Right Kidney: Length: 10.5 cm. Echogenicity within normal limits. No mass or hydronephrosis visualized. Left Kidney: Length: 12.5 cm. Echogenicity within normal limits. No mass or hydronephrosis visualized. Abdominal aorta: Measures 3.2 cm proximally. Other findings: None. IMPRESSION: 1. Mild aneurysmal dilatation of the abdominal aorta, measuring 3.2 cm proximally. Recommend followup by ultrasound in 3 years. This recommendation follows ACR consensus guidelines: White Paper of the ACR Incidental Findings Committee II on Vascular Findings. J Am Coll Radiol 2013; 66:063-016 2. Probable hepatic steatosis. Electronically Signed   By: [name] M.D   On: 03/26/2018 17:35     ASSESSMENT [name] is a 68 y.o. male with PMH CVA ( 2019) with residual left side deficits, HTN, HLD, DM2, CHF, CAD, ETOH abuse, COPD who presented to [facility] ed as a code stroke for left side paralysis and slurred speech. CTH: no hemorrhage. No TPA d./t patient being outside of window. CTA: no LVO, but chronic right ICA occlusion. Patient also known to have uncontrolled risk factors. Stroke Risk Factors - diabetes mellitus, hyperlipidemia, hypertension and smoking   PLAN :  He has presented with worsening  of his left hemiplegia due to right ACA infarct likely from failure of collaterals circulation as well as cocaine related vasospasm. He is not a candidate for revascularization. Urine drug screen is positive for cocaine. Recommend permissive hypertension and dual antiplatelet therapy. Physical occupational therapy and rehabilitation consults. Patient counseled  to quit cocaine.physical occupational therapy consults.He will likely need rehabilitation.  Continue aspirin and Plavix. Aggressive risk factor modification.He was counseled to quit cocaine    Stroke team will sign off. Kindly call for questions.  Hospital day # [place], MD [place] for Schedule & Pager information 03/27/2018 2:09 PM    To contact Stroke Continuity provider, please refer to [email or web address] After hours, contact General Neurology

## 2018-03-27 NOTE — Clinical Social Work Note (Signed)
Clinical Social Work Assessment  Patient Details  Name: [name] MRN: [id number] Date of Birth: 09-Oct-1950  Date of referral:  03/27/18               Reason for consult:  Discharge Planning                Permission sought to share information with:  Case Manager Permission granted to share information::  Yes, Verbal Permission Granted  Name::        Agency::  All SNF's  Relationship::     Contact Information:     Housing/Transportation Living arrangements for the past 2 months:  Single Family Home Source of Information:  Patient Patient Interpreter Needed:  None Criminal Activity/Legal Involvement Pertinent to Current Situation/Hospitalization:  No - Comment as needed Significant Relationships:  Friend Lives with:  Self Do you feel safe going back to the place where you live?  Yes Need for family participation in patient care:  No (Coment)  Care giving concerns:    Patient lives at home alone.    Social Worker assessment / plan:    CSW met with the patient at bedside. Patient is alert and oriented. Patient is agreeable to going to a skilled nursing facility. Patient stated that he has been to [facility] in the past. Patient reported that he had no other concerns. He is familiar with the skilled nursing facility process. Patient lives at home alone. Patient does have a friend that is listed in his chart.   Employment status:  Retired Nurse, adult PT Recommendations:  [place] / Referral to community resources:  [place]  Patient/Family's Response to care:  Patient is understanding of his current medical condition. Patient is wanting to pursue with a skilled nursing placement.   Patient/Family's Understanding of and Emotional Response to Diagnosis, Current Treatment, and Prognosis:  Patient seemed calm and understanding. He is familiar with [facility].   Emotional  Assessment Appearance:  Appears stated age Attitude/Demeanor/Rapport:  Unable to Assess Affect (typically observed):  Unable to Assess Orientation:  Oriented to Self, Oriented to Place, Oriented to Situation Alcohol / Substance use:  Tobacco Use Psych involvement (Current and /or in the community):  No (Comment)  Discharge Needs  Concerns to be addressed:  Discharge Planning Concerns Readmission within the last 30 days:    Current discharge risk:  Dependent with Mobility Barriers to Discharge:  [place], [place] 03/27/2018, 12:48 PM

## 2018-03-27 NOTE — NC FL2 (Addendum)
[place] LEVEL OF CARE SCREENING TOOL     IDENTIFICATION  Patient Name: [name] Birthdate: April 14, 1950 Sex: male Admission Date (Current Location): 03/25/2018  [facility] and [place] Number:  Herbalist and Address:  The . [facility], [place] [address] [place], [place] [address]      Provider Number: [id number]  Attending Physician Name and Address:  McDiarmid, Blane Ohara, MD  Relative Name and Phone Number:  [name]    Current Level of Care: Hospital Recommended Level of Care: [place] Prior Approval Number: [id number]  Date Approved/Denied: 02/12/15 PASRR Number: [id number]   Discharge Plan: SNF    Current Diagnoses: Patient Active Problem List   Diagnosis Date Noted  . Normocytic anemia 03/26/2018  . Abdominal pain, acute, left upper quadrant   . Dysarthria   . Recurrent abdominal pain   . Dementia due to another medical condition, without behavioral disturbance ([place])   . Pruritus   . Left hemiplegia ([place])   . Insomnia 01/29/2016  . Psoriasiform dermatitis 07/12/2015  . Type 2 diabetes mellitus with peripheral neuropathy ([place]) 03/13/2015  . History of Hemiparesis affecting left side as late effect of cerebrovascular accident ([place]) 02/18/2015  . Coronary artery disease involving native coronary artery of native heart without angina pectoris   . History of stroke   . PAF (paroxysmal atrial fibrillation) ([place]) 11/23/2014  . HLD (hyperlipidemia) 11/01/2014  . Tobacco use disorder 11/01/2014  . Carotid stenosis 11/01/2014  . Essential hypertension   . Polysubstance abuse ([place]) 08/28/2014  . Cocaine abuse ([place])   . Intracranial carotid stenosis   . Alcohol use disorder, moderate, dependence ([place])   . Stroke ([place])   . Chronic diastolic congestive heart failure ([place]) 11/21/2013  . GERD (gastroesophageal reflux disease) 08/10/2013  . Gout 08/10/2013  . Peripheral vascular disease ([place]) 07/27/2012   . COPD (chronic obstructive pulmonary disease) ([place]) 04/04/2012    Orientation RESPIRATION BLADDER Height & Weight     Self, Time, Situation, Place  Normal Continent Weight: 223 lb 12.3 oz (101.5 kg) Height:  6' (182.9 cm)  BEHAVIORAL SYMPTOMS/MOOD NEUROLOGICAL BOWEL NUTRITION STATUS      Continent Diet(Dys 3 thin liquids)  AMBULATORY STATUS COMMUNICATION OF NEEDS Skin   Limited Assist Verbally Normal, Skin abrasions, Bruising(Brusing on arm/leg and abrasion on arm/leg)                       Personal Care Assistance Level of Assistance  Bathing, Feeding, Dressing, Total care Bathing Assistance: Limited assistance Feeding assistance: Independent(Needs help setting up but can feed self) Dressing Assistance: Limited assistance Total Care Assistance: Limited assistance   Functional Limitations Info  Sight, Hearing, Speech Sight Info: Adequate Hearing Info: Adequate Speech Info: Adequate    SPECIAL CARE FACTORS FREQUENCY  PT (By licensed PT), OT (By licensed OT)     PT Frequency: 5x/wk OT Frequency: 5x/wk            Contractures Contractures Info: Not present    Additional Factors Info  Code Status, Allergies Code Status Info: DNR Allergies Info: Darvocet Propoxyphene N-acetaminophen, Haldol Haloperidol Decanoate, Acetaminophen, Metformin And Related, Norco Hydrocodone-acetaminophen           Current Medications (03/27/2018):  This is the current hospital active medication list Current Facility-Administered Medications  Medication Dose Route Frequency Provider Last Rate Last Dose  . acetaminophen (TYLENOL) tablet 650 mg  650 mg Oral Q4H PRN Marjie Skiff, MD  Or  . acetaminophen (TYLENOL) solution 650 mg  650 mg Per Tube Q4H PRN Diallo, Abdoulaye, MD       Or  . acetaminophen (TYLENOL) suppository 650 mg  650 mg Rectal Q4H PRN Diallo, Abdoulaye, MD      . allopurinol (ZYLOPRIM) tablet 300 mg  300 mg Oral Daily Rumball, Alison, DO   300 mg at 03/27/18  1100  . aspirin tablet 325 mg  325 mg Oral Daily Garvin Fila, MD   325 mg at 03/27/18 1100  . atorvastatin (LIPITOR) tablet 80 mg  80 mg Oral q1800 Rory Percy, DO   80 mg at 03/26/18 1758  . baclofen (LIORESAL) tablet 10 mg  10 mg Oral QHS Garvin Fila, MD   10 mg at 03/26/18 2115  . carvedilol (COREG) tablet 3.125 mg  3.125 mg Oral BID WC Garvin Fila, MD   3.125 mg at 03/27/18 0900  . clopidogrel (PLAVIX) tablet 75 mg  75 mg Oral Daily Garvin Fila, MD   75 mg at 03/27/18 1100  . DULoxetine (CYMBALTA) DR capsule 60 mg  60 mg Oral Daily Rory Percy, DO   60 mg at 03/27/18 1100  . enoxaparin (LOVENOX) injection 40 mg  40 mg Subcutaneous Q24H Diallo, Abdoulaye, MD   40 mg at 03/26/18 1800  . folic acid (FOLVITE) tablet 1 mg  1 mg Oral Daily Garvin Fila, MD   1 mg at 03/27/18 1100  . hydrOXYzine (ATARAX/VISTARIL) tablet 25 mg  25 mg Oral Q8H PRN Garvin Fila, MD      . levETIRAcetam (KEPPRA) tablet 500 mg  500 mg Oral BID Rory Percy, DO   500 mg at 03/27/18 1100  . LORazepam (ATIVAN) tablet 1 mg  1 mg Oral Q6H PRN Diallo, Abdoulaye, MD       Or  . LORazepam (ATIVAN) injection 1 mg  1 mg Intravenous Q6H PRN Diallo, Abdoulaye, MD      . memantine (NAMENDA) tablet 10 mg  10 mg Oral BID Rory Percy, DO   10 mg at 03/27/18 1100  . mometasone-formoterol (DULERA) 200-5 MCG/ACT inhaler 2 puff  2 puff Inhalation BID McDiarmid, Blane Ohara, MD   2 puff at 03/27/18 0743  . multivitamin with minerals tablet 1 tablet  1 tablet Oral Daily Diallo, Abdoulaye, MD   1 tablet at 03/27/18 1100  . nicotine (NICODERM CQ - dosed in mg/24 hours) patch 21 mg  21 mg Transdermal Daily Garvin Fila, MD   21 mg at 03/27/18 1100  . pantoprazole (PROTONIX) EC tablet 40 mg  40 mg Oral Daily Rumball, Bryson Ha, DO   40 mg at 03/27/18 1100  . polyethylene glycol (MIRALAX / GLYCOLAX) packet 17 g  17 g Oral Daily McDiarmid, Blane Ohara, MD   17 g at 03/26/18 1304  . pregabalin (LYRICA) capsule 150 mg  150  mg Oral BID Garvin Fila, MD   150 mg at 03/27/18 1100  . senna (SENOKOT) tablet 17.2 mg  2 tablet Oral QHS McDiarmid, Blane Ohara, MD   17.2 mg at 03/26/18 2115  . sodium chloride flush (NS) 0.9 % injection 3 mL  3 mL Intravenous Once Diallo, Abdoulaye, MD      . thiamine (VITAMIN B-1) tablet 100 mg  100 mg Oral Daily Diallo, Abdoulaye, MD   100 mg at 03/27/18 1100   Or  . thiamine (B-1) injection 100 mg  100 mg Intravenous Daily Diallo, Earna Coder, MD  Discharge Medications: Please see discharge summary for a list of discharge medications.  Relevant Imaging Results:  Relevant Lab Results:   Additional Information SSN: [id number]  Philippa Chester Pinion, LCSWA

## 2018-03-28 ENCOUNTER — Inpatient Hospital Stay (HOSPITAL_COMMUNITY): Payer: Medicare Other

## 2018-03-28 DIAGNOSIS — R911 Solitary pulmonary nodule: Secondary | ICD-10-CM

## 2018-03-28 DIAGNOSIS — I771 Stricture of artery: Secondary | ICD-10-CM

## 2018-03-28 LAB — CBC
HCT: 34.5 % — ABNORMAL LOW (ref 39.0–52.0)
Hemoglobin: 10.2 g/dL — ABNORMAL LOW (ref 13.0–17.0)
MCH: 24.8 pg — ABNORMAL LOW (ref 26.0–34.0)
MCHC: 29.6 g/dL — ABNORMAL LOW (ref 30.0–36.0)
MCV: 83.7 fL (ref 80.0–100.0)
NRBC: 0 % (ref 0.0–0.2)
Platelets: 170 10*3/uL (ref 150–400)
RBC: 4.12 MIL/uL — AB (ref 4.22–5.81)
RDW: 14.3 % (ref 11.5–15.5)
WBC: 5.2 10*3/uL (ref 4.0–10.5)

## 2018-03-28 LAB — BASIC METABOLIC PANEL
ANION GAP: 7 (ref 5–15)
BUN: 9 mg/dL (ref 8–23)
CO2: 26 mmol/L (ref 22–32)
Calcium: 9.2 mg/dL (ref 8.9–10.3)
Chloride: 110 mmol/L (ref 98–111)
Creatinine, Ser: 0.8 mg/dL (ref 0.61–1.24)
GFR calc Af Amer: >60 mL/min (ref >60)
GFR calc non Af Amer: >60 mL/min (ref >60)
Glucose, Bld: 111 mg/dL — ABNORMAL HIGH (ref 70–99)
POTASSIUM: 4.1 mmol/L (ref 3.5–5.1)
Sodium: 143 mmol/L (ref 135–145)

## 2018-03-28 LAB — HEMOGLOBIN A1C
Hgb A1c MFr Bld: 5.7 % — ABNORMAL HIGH (ref 4.8–5.6)
MEAN PLASMA GLUCOSE: 117 mg/dL

## 2018-03-28 MED ORDER — IOHEXOL 300 MG/ML  SOLN
75.0000 mL | Freq: Once | INTRAMUSCULAR | Status: AC | PRN
  Administered 2018-03-28: 75 mL via INTRAVENOUS

## 2018-03-28 NOTE — Care Management Important Message (Signed)
Important Message  Patient Details  Name: [name] MRN: [id number] Date of Birth: [birth year]   Medicare Important Message Given:  Yes    [name] 03/28/2018, 3:12 PM

## 2018-03-28 NOTE — Evaluation (Signed)
Clinical/Bedside Swallow Evaluation Patient Details  Name: [name] MRN: [id number] Date of Birth: 1951-01-13  Today's Date: 03/28/2018 Time: SLP Start Time (ACUTE ONLY): 1135 SLP Stop Time (ACUTE ONLY): 1145 SLP Time Calculation (min) (ACUTE ONLY): 10 min  Past Medical History:  Past Medical History:  Diagnosis Date  . Acute blood loss anemia   . Acute CVA (cerebrovascular accident) ([place]) 08/27/2014  . Acute encephalopathy 11/28/2017  . Acute ischemic stroke ([place])   . AKI (acute kidney injury) ([place])   . Alcohol abuse    H/O  . Alcohol dependence with withdrawal with complication ([place]) 08/17/6281  . Anxiety   . Arterial ischemic stroke, MCA (middle cerebral artery), right, acute ([place])   . Arthritis   . Asthma   . Binocular vision disorder with diplopia 01/03/2013  . CAD (coronary artery disease) 05/27/10   Cath: severe single vessell CAD left cx midportion obtuse marginal 2 to 3.  . Cerebral infarction due to embolism of right middle cerebral artery ([place])   . Cerebral infarction due to stenosis of right carotid artery ([place]) 11/01/2014  . Cerebral thrombosis with cerebral infarction 07/18/2016  . Cerebrovascular accident (CVA) due to stenosis of right carotid artery ([place])   . CHF (congestive heart failure) ([place])   . Closed blow-out fracture of floor of orbit ([place]) 01/19/2013  . COPD (chronic obstructive pulmonary disease) ([place])   . COPD (chronic obstructive pulmonary disease) ([place])   . CVA (cerebral vascular accident) ([place]) 08/26/2014  . Dementia due to another medical condition ([place]) 07/12/2015  . Depression   . Diabetes mellitus type 2 in obese ([place])   . Diabetes mellitus, type 2 ([place]) 03/13/2015  . Diastolic CHF, acute on chronic (HCC) 11/01/2014  . DOE (dyspnea on exertion) 11/23/2014  . DVT (deep venous thrombosis) ([place]) 02/22/2014  . Embolic stroke ([place])   . Enophthalmos due to trauma 01/19/2013  . Eyelid lesion 01/24/2013  . Fracture of inferior orbital wall ([place])  01/03/2013  . Fracture of orbital floor ([place]) 01/03/2013  . GERD (gastroesophageal reflux disease)   . H/O total knee replacement 10/24/2014  . HCAP (healthcare-associated pneumonia) 10/15/2015  . Headache around the eyes 08/27/2014  . History of DVT (deep vein thrombosis)   . History of DVT of lower extremity   . Hyperkalemia 10/24/2014  . Hyperlipemia   . Hyperlipidemia   . Hypertension   . Hypoalbuminemia due to protein-calorie malnutrition ([place])   . Hypotension   . Labile blood pressure   . Left shoulder pain 09/28/2017  . Lower extremity edema 02/21/2014  . Medial orbital wall fracture 01/19/2013  . Myocardial infarction ([place]) 2000  . Orbital fracture 12/2012  . OSA on CPAP   . Pain of left eye 01/03/2013  . Peripheral vascular disease, unspecified ([place])    08/20/10 doppler: increase in right ABI post-op. Left ABI stable. S/P bi-fem bypass surgery  . Pneumonia 04/04/2012  . Right middle cerebral artery stroke ([place]) 01/18/2018  . Seizures ([place])   . Shortness of breath   . Stroke (cerebrum) ([place]) 02/06/2015  . Stroke ([place])   . Thrombocytopenia ([place])   . Tobacco abuse   . Trichiasis without entropion 04/16/2013   Overview:  LLL central   . Type 2 diabetes mellitus with peripheral neuropathy ([place]) 03/13/2015  . Urinary incontinence 03/14/2015  . Urinary incontinence 03/14/2015   Past Surgical History:  Past Surgical History:  Procedure Laterality Date  . abdoninal ao angio & bifem angio  05/27/10   Patent graft, occluded  bil stents with no retrograde flow into the hypogastric arteries. 100% occl left ant. tibial artery, 70% to 80% to 100% stenosis right superficial fem artery above adductor canal. 100 % occl right ant. tibial vessell  . Aortogram w/ PTCA  09/292003  . BACK SURGERY    . CARDIAC CATHETERIZATION  05/27/10   severe CAD left cx  . CHOLECYSTECTOMY    . ESOPHAGOGASTRIC FUNDOPLICATION    . EYE SURGERY    . FEMORAL BYPASS  08/19/10   Right Fem-Pop  . IR ANGIO INTRA  EXTRACRAN SEL COM CAROTID INNOMINATE BILAT MOD SED  07/20/2016  . IR ANGIO INTRA EXTRACRAN SEL COM CAROTID INNOMINATE BILAT MOD SED  01/14/2018  . IR ANGIO VERTEBRAL SEL SUBCLAVIAN INNOMINATE UNI R MOD SED  07/20/2016  . IR ANGIO VERTEBRAL SEL VERTEBRAL BILAT MOD SED  01/14/2018  . IR ANGIO VERTEBRAL SEL VERTEBRAL UNI L MOD SED  07/20/2016  . IR RADIOLOGIST EVAL & MGMT  05/27/2016  . IR US GUIDE VASC ACCESS RIGHT  01/14/2018  . JOINT REPLACEMENT    . PERIPHERAL VASCULAR CATHETERIZATION N/A 12/10/2014   Procedure: Abdominal Aortogram;  Surgeon: Angelia Mould, MD;  Location: [facility];  Service: Cardiovascular;  Laterality: N/A;  . RADIOLOGY WITH ANESTHESIA N/A 02/08/2015   Procedure: RADIOLOGY WITH ANESTHESIA;  Surgeon: Luanne Bras, MD;  Location: [facility];  Service: Radiology;  Laterality: N/A;  . TEE WITHOUT CARDIOVERSION N/A 08/29/2014   Procedure: TRANSESOPHAGEAL ECHOCARDIOGRAM (TEE);  Surgeon: Josue Hector, MD;  Location: [facility];  Service: Cardiovascular;  Laterality: N/A;  . TOTAL KNEE ARTHROPLASTY     HPI:  Pt is a 68 y.o. male admitted 03/25/18 with L-side LE and face weakness; MRI showed acute ischemia in R ACA distribution with small foci in MCA. PMH includes prior stroke (residual LUE weakness), COPD, seizure disorder, DVT, CHF, polysubstance abuse, dementia. Of note, recent admission 01/2018 with R MCA infarct with d/c to CIR. Disicharged in dysphagia 3 diet with thin liquids.    Assessment / Plan / Recommendation Clinical Impression  Pt with recent admission and discharge on dysphagia 3 with thin liquid diet for ease of mastication d/ edentulous status. During this evaluation, pt consumed dysphagia 3 lunch tray with thin liquids without overt s/s of dysphagia or aspiration. Pt continues to be edentulous and requests modified diet. Current diet appears appropriate with no acute deficits identified. ST to sign off at this time.  SLP Visit Diagnosis: Dysphagia,  unspecified (R13.10)    Aspiration Risk  Mild aspiration risk    Diet Recommendation Dysphagia 3 (Mech soft);Thin liquid   Liquid Administration via: Cup;Straw Medication Administration: Whole meds with liquid Supervision: Patient able to self feed Compensations: Minimize environmental distractions;Slow rate;Small sips/bites Postural Changes: Seated upright at 90 degrees    Other  Recommendations Oral Care Recommendations: Oral care BID   Follow up Recommendations Skilled Nursing facility        Swallow Study   General Date of Onset: 03/25/18 HPI: Pt is a 68 y.o. male admitted 03/25/18 with L-side LE and face weakness; MRI showed acute ischemia in R ACA distribution with small foci in MCA. PMH includes prior stroke (residual LUE weakness), COPD, seizure disorder, DVT, CHF, polysubstance abuse, dementia. Of note, recent admission 01/2018 with R MCA infarct with d/c to CIR. Disicharged in dysphagia 3 diet with thin liquids.  Type of Study: Bedside Swallow Evaluation Previous Swallow Assessment: BSE - recommending dysphagia 3 with thin liquids Diet Prior to this Study:  Dysphagia 3 (soft);Thin liquids Temperature Spikes Noted: No Respiratory Status: Room air History of Recent Intubation: No Behavior/Cognition: Alert;Cooperative;Pleasant mood Oral Cavity Assessment: Within Functional Limits Oral Care Completed by SLP: No Oral Cavity - Dentition: Edentulous Vision: Functional for self-feeding Self-Feeding Abilities: Able to feed self Patient Positioning: Upright in chair Baseline Vocal Quality: Normal Volitional Cough: Strong Volitional Swallow: Able to elicit    Oral/Motor/Sensory Function Overall Oral Motor/Sensory Function: Mild impairment(at baseline)   Ice Chips Ice chips: Not tested   Thin Liquid Thin Liquid: Within functional limits Presentation: Cup;Self Fed;Straw    Nectar Thick Nectar Thick Liquid: Not tested   Honey Thick Honey Thick Liquid: Not tested   Puree  Puree: Not tested   Solid     Solid: Within functional limits Presentation: Self Fed;Spoon Other Comments: dysphagia 3      Griselda Bramblett 03/28/2018,12:55 PM

## 2018-03-28 NOTE — Progress Notes (Signed)
Occupational Therapy Treatment Patient Details Name: [name] MRN: [id number] DOB: 01-Jun-1950 Today's Date: 03/28/2018    History of present illness Pt is a 68 y.o. male admitted 03/25/18 with L-side LE and face weakness; MRI showed acute ischemia in R ACA distribution with small foci in MCA. PMH includes prior stroke (residual LUE weakness), COPD, seizure disorder, DVT, CHF, polysubstance abuse, dementia. Of note, recent admission 01/2018 with R MCA infarct with d/c to CIR.   OT comments  Pt progressing towards established OT goals and continues to present with high motivation. Pt performing sponge bath with Min A for UB bathing and Mod A+2 for LB bathing. Pt performing LB dressing with Mod-Max A and focusing on compensatory techniques for donning pants. Pt continues to present with decreased balance, functional use of LUE, and cognitive deficits. Continue to recommend CIR for intensive rehab and will continue to follow acutely admitted.   Follow Up Recommendations  CIR;Supervision/Assistance - 24 hour    Equipment Recommendations  Other (comment)    Recommendations for Other Services Rehab consult;PT consult;Speech consult    Precautions / Restrictions Precautions Precautions: Fall       Mobility Bed Mobility                  Transfers Overall transfer level: Needs assistance Equipment used: Rolling walker (2 wheeled) Transfers: Sit to/from Stand Sit to Stand: Mod assist         General transfer comment: Mod A for power up into     Balance Overall balance assessment: Needs assistance Sitting-balance support: No upper extremity supported;Feet supported Sitting balance-Leahy Scale: Fair       Standing balance-Leahy Scale: Poor Standing balance comment: Reliant on UE support                           ADL either performed or assessed with clinical judgement   ADL Overall ADL's : Needs assistance/impaired     Grooming: Wash/dry face;Set  up;Supervision/safety;Sitting   Upper Body Bathing: Minimal assistance;Sitting Upper Body Bathing Details (indicate cue type and reason): Min A to wash back and to assist in lifting LUE for cleaning axillary area Lower Body Bathing: Sit to/from stand;Moderate assistance;+2 for physical assistance Lower Body Bathing Details (indicate cue type and reason): Pt able to wash peri care and legs while seated in recliner. Requiring Mod A for sit<>Stand and Min A for maintaining balance while second perform assists with washing back of legs and rear.  Upper Body Dressing : Moderate assistance;Sitting Upper Body Dressing Details (indicate cue type and reason): Pt requiring Max cues for compensatory technique. Mod A to don new gown over LUE Lower Body Dressing: Maximal assistance;Moderate assistance;Sit to/from stand Lower Body Dressing Details (indicate cue type and reason): Max A to don socks. Mod A for donning pants. Pt requring assistance to bring LLE into figure four pattern and maintaining the position while he don pant leg on LLE. Pt then requiring Mod A to pull over hips.  Toilet Transfer: Moderate assistance;Ambulation;RW(simulated to recliner) Toilet Transfer Details (indicate cue type and reason): Mod A for managing RW and balance         Functional mobility during ADLs: Moderate assistance;Rolling walker General ADL Comments: Continues to present with decreased balance and functional use of LUE.      Vision       Perception     Praxis      Cognition Arousal/Alertness: Awake/alert Behavior During Therapy: [facility] for  tasks assessed/performed Overall Cognitive Status: History of cognitive impairments - at baseline Area of Impairment: Attention;Memory;Following commands;Safety/judgement;Awareness;Problem solving                   Current Attention Level: Selective Memory: Decreased short-term memory Following Commands: Follows one step commands inconsistently;Follows one step  commands with increased time Safety/Judgement: Decreased awareness of deficits;Decreased awareness of safety Awareness: Emergent Problem Solving: Slow processing;Requires verbal cues;Requires tactile cues General Comments: H/o dementia per chart. Continues to present with decreased processing and problem solving. Pt         Exercises     Shoulder Instructions       General Comments Continues to be highly motivated to particiapte in therapy    Pertinent Vitals/ Pain       Pain Assessment: No/denies pain  Home Living                                          Prior Functioning/Environment              Frequency  Min 2X/week        Progress Toward Goals  OT Goals(current goals can now be found in the care plan section)  Progress towards OT goals: Progressing toward goals  Acute Rehab OT Goals Patient Stated Goal: Agreeble to post-acute rehab  OT Goal Formulation: With patient Time For Goal Achievement: 04/09/18 Potential to Achieve Goals: Good ADL Goals Pt Will Perform Grooming: with set-up;with supervision;sitting Pt Will Perform Upper Body Dressing: with set-up;with supervision;sitting Pt Will Perform Lower Body Dressing: with min assist;sit to/from stand Pt Will Transfer to Toilet: with min guard assist;ambulating;bedside commode Pt Will Perform Toileting - Clothing Manipulation and hygiene: with min guard assist;sit to/from stand Pt/caregiver will Perform Home Exercise Program: Left upper extremity;With Supervision;With written HEP provided;Increased ROM;Increased strength  Plan Discharge plan remains appropriate    Co-evaluation                 AM-PAC OT "6 Clicks" Daily Activity     Outcome Measure   Help from another person eating meals?: A Little Help from another person taking care of personal grooming?: A Little Help from another person toileting, which includes using toliet, bedpan, or urinal?: A Lot Help from another  person bathing (including washing, rinsing, drying)?: A Lot Help from another person to put on and taking off regular upper body clothing?: A Lot Help from another person to put on and taking off regular lower body clothing?: A Lot 6 Click Score: 14    End of Session Equipment Utilized During Treatment: Gait belt;Rolling walker  OT Visit Diagnosis: Unsteadiness on feet (R26.81);Other abnormalities of gait and mobility (R26.89);Muscle weakness (generalized) (M62.81);Other symptoms and signs involving cognitive function;Hemiplegia and hemiparesis Hemiplegia - Right/Left: Left Hemiplegia - dominant/non-dominant: Non-Dominant Hemiplegia - caused by: Cerebral infarction   Activity Tolerance Patient tolerated treatment well   Patient Left in chair;with call bell/phone within reach;with chair alarm set   Nurse Communication Mobility status        Time: 2423-5361 OT Time Calculation (min): 26 min  Charges: OT General Charges $OT Visit: 1 Visit OT Treatments $Self Care/Home Management : 23-37 mins  J[place], OTR/L Acute Rehab Pager: [phone] Office: [place] 03/28/2018, 9:57 AM

## 2018-03-28 NOTE — Progress Notes (Signed)
STROKE TEAM PROGRESS NOTE     SUBJECTIVE  he remains neurologically unchanged with persistent dense left hemiplegia. No other changes noted overnight.  OBJECTIVE Vitals:   03/28/18 0740 03/28/18 1156  BP: (!) 152/81 (!) 153/67  Pulse: 81 87  Resp: 16 18  Temp: 97.7 F (36.5 C) 97.9 F (36.6 C)  SpO2: 94% 97%     Physical Exam Obese middle-aged Caucasian male not in distress. . Afebrile. Head is nontraumatic. Neck is supple without bruit.    Cardiac exam no murmur or gallop. Lungs are clear to auscultation. Distal pulses are well felt.  Neurological Exam :  Awake alert oriented to time place and person. Mild dysarthria but can be easily understood. No aphasia. Follows commands well. Extraocular movements are full range without nystagmus. Blinks to threat bilaterally. Fundi not visualized. Vision acuity seems adequate. Mild left lower facial weakness. Tongue midline. Motor system exam reveals dense left hemiplegia with grade 1-2/5 strength in the upper and 3/5 in  lower extremity. Normal strength in the right side. Touch pinprick sensation are preserved bilaterally. Vibration is normal. Deep tendon reflexes are brisker on the right than left. Both plantars are downgoing. Gait not tested.  Pertinent Laboratory Studies (past 3 days) / Diagnostics (past 24h) Recent Labs    03/26/18 0614 03/27/18 0811 03/28/18 0616  WBC 5.0 4.7 5.2  HGB 9.9* 9.6* 10.2*  PLT 183 176 170  NA 142 142 143  K 3.9 4.2 4.1  CREATININE 0.62 0.73 0.80  GLUCOSE 97 106* 111*    Ct Chest W Contrast  Result Date: 03/28/2018 CLINICAL DATA:  Follow-up lung nodule suspected on chest radiograph EXAM: CT CHEST WITH CONTRAST TECHNIQUE: Multidetector CT imaging of the chest was performed during intravenous contrast administration. CONTRAST:  61mL OMNIPAQUE IOHEXOL 300 MG/ML  SOLN COMPARISON:  Chest radiograph, 03/27/2018 FINDINGS: Cardiovascular: Cardiomegaly with 3 vessel coronary artery calcifications. No pericardial  effusion. There is severe aortic atherosclerosis with approximately 50% mixed atherosclerotic stenosis of the origin of the brachiocephalic artery (series 3, image 37, series 5, image 73). Mediastinum/Nodes: No enlarged mediastinal, hilar, or axillary lymph nodes. Thyroid gland, trachea, and esophagus demonstrate no significant findings. Lungs/Pleura: There is a lobulated 2.0 cm pulmonary nodule of the right upper lobe with subtle adjacent spiculation and architectural distortion (series 7, image 56). There are additional small nonspecific pulmonary nodules present elsewhere, for example a 4 mm nodule of the right middle lobe (series 7, image 86). Bibasilar atelectasis or scarring. No pleural effusion or pneumothorax. Upper Abdomen: No acute abnormality. Musculoskeletal: No chest wall mass or suspicious bone lesions identified. IMPRESSION: 1. There is a lobulated 2.0 cm pulmonary nodule of the right upper lobe with subtle adjacent spiculation and architectural distortion (series 7, image 56). This nodule is highly suspicious for malignancy. Given size and location, diagnostic options include PET-CT for metabolic characterization and percutaneous CT-guided biopsy. At minimum, recommend follow-up CT in 3 months to ensure stability. 2.  Coronary artery disease. 3. There is severe aortic atherosclerosis with approximately 50% mixed atherosclerotic stenosis of the origin of the brachiocephalic artery (series 3, image 37, series 5, image 73). Electronically Signed   By: Eddie Candle M.D.   On: 03/28/2018 14:50     ASSESSMENT Mr. KELLY EISLER is a 68 y.o. male with PMH CVA ( 2019) with residual left side deficits, HTN, HLD, DM2, CHF, CAD, ETOH abuse, COPD who presented to [facility] ed as a code stroke for left side paralysis and slurred speech. CTH: no  hemorrhage. No TPA d./t patient being outside of window. CTA: no LVO, but chronic right ICA occlusion. Patient also known to have uncontrolled risk factors. Stroke Risk  Factors - diabetes mellitus, hyperlipidemia, hypertension and smoking   PLAN :  He has presented with worsening of his left hemiplegia due to right ACA infarct likely from failure of collaterals circulation as well as cocaine related vasospasm. He is not a candidate for revascularization. Urine drug screen is positive for cocaine. Recommend permissive hypertension and dual antiplatelet therapy. Physical occupational therapy and rehabilitation consults. Patient counseled to quit cocaine.physical occupational therapy consults.He will likely need rehabilitation.  Continue aspirin and Plavix. Aggressive risk factor modification.He was counseled to quit cocaine    Stroke team will sign off. Kindly call for questions.  Hospital day # [place], MD [place] for Schedule & Pager information 03/28/2018 3:19 PM    To contact Stroke Continuity provider, please refer to [email or web address] After hours, contact General Neurology

## 2018-03-28 NOTE — Progress Notes (Signed)
Physical Therapy Treatment Patient Details Name: JAAZIAH SCHULKE MRN: [id number] DOB: [birth year] Today's Date: 03/28/2018    History of Present Illness Pt is a 68 y.o. male admitted 03/25/18 with L-side LE and face weakness; MRI showed acute ischemia in R ACA distribution with small foci in MCA. PMH includes prior stroke (residual LUE weakness), COPD, seizure disorder, DVT, CHF, polysubstance abuse, dementia. Of note, recent admission 01/2018 with R MCA infarct with d/c to CIR.    PT Comments    Improving slowly, but appropriate for CIR level of therapies.  Emphasis on transitions to EOB, sit to stand and gait training in the RW today.    Follow Up Recommendations  CIR;Supervision for mobility/OOB     Equipment Recommendations  Other (comment)(TBA)    Recommendations for Other Services       Precautions / Restrictions Precautions Precautions: Fall    Mobility  Bed Mobility Overal bed mobility: Needs Assistance Bed Mobility: Rolling;Sidelying to Sit;Sit to Supine Rolling: Min assist;Mod assist Sidelying to sit: Mod assist;+2 for physical assistance   Sit to supine: Mod assist;+2 for physical assistance   General bed mobility comments: bridged and scooted with little assist, cues for direction and truncal assist to support while pt assisted with UE's  Transfers Overall transfer level: Needs assistance Equipment used: Rolling walker (2 wheeled) Transfers: Sit to/from Stand Sit to Stand: Mod assist;+2 physical assistance;+2 safety/equipment(x3)         General transfer comment: cues for hand placement and mod of 2 to power up  Ambulation/Gait Ambulation/Gait assistance: Mod assist;+2 physical assistance Gait Distance (Feet): 30 Feet Assistive device: Rolling walker (2 wheeled) Gait Pattern/deviations: Step-to pattern Gait velocity: Decreased Gait velocity interpretation: <1.31 ft/sec, indicative of household ambulator General Gait Details: heavy paretic gait on  the Left with moderate, but variable assist for w/shift and assist to help swing through.  Pt's L hand was held to the RW also.   Stairs             Wheelchair Mobility    Modified Rankin (Stroke Patients Only) Modified Rankin (Stroke Patients Only) Modified Rankin: Moderately severe disability     Balance Overall balance assessment: Needs assistance   Sitting balance-Leahy Scale: Fair       Standing balance-Leahy Scale: Poor Standing balance comment: Reliant on UE support                            Cognition Arousal/Alertness: Awake/alert Behavior During Therapy: WFL for tasks assessed/performed Overall Cognitive Status: History of cognitive impairments - at baseline                     Current Attention Level: Selective   Following Commands: Follows one step commands consistently;Follows one step commands with increased time Safety/Judgement: Decreased awareness of deficits;Decreased awareness of safety Awareness: Emergent Problem Solving: Slow processing;Requires verbal cues;Requires tactile cues        Exercises      General Comments        Pertinent Vitals/Pain Pain Assessment: No/denies pain    Home Living                      Prior Function            PT Goals (current goals can now be found in the care plan section) Acute Rehab PT Goals Patient Stated Goal: Agreeble to post-acute rehab  PT Goal Formulation: With patient Time  For Goal Achievement: 04/09/18 Potential to Achieve Goals: Good Progress towards PT goals: Progressing toward goals    Frequency    Min 4X/week      PT Plan Current plan remains appropriate    Co-evaluation              AM-PAC PT "6 Clicks" Mobility   Outcome Measure  Help needed turning from your back to your side while in a flat bed without using bedrails?: A Little Help needed moving from lying on your back to sitting on the side of a flat bed without using bedrails?:  A Lot Help needed moving to and from a bed to a chair (including a wheelchair)?: A Lot Help needed standing up from a chair using your arms (e.g., wheelchair or bedside chair)?: A Lot Help needed to walk in hospital room?: A Lot Help needed climbing 3-5 steps with a railing? : A Lot 6 Click Score: 13    End of Session   Activity Tolerance: Patient tolerated treatment well Patient left: in bed;with call bell/phone within reach;with bed alarm set Nurse Communication: Mobility status PT Visit Diagnosis: Other abnormalities of gait and mobility (R26.89);Muscle weakness (generalized) (M62.81);Hemiplegia and hemiparesis Hemiplegia - Right/Left: Left Hemiplegia - dominant/non-dominant: Non-dominant Hemiplegia - caused by: Cerebral infarction     Time: 2500-3704 PT Time Calculation (min) (ACUTE ONLY): 25 min  Charges:  $Therapeutic Activity: 8-22 mins $Neuromuscular Re-education: 8-22 mins                     03/28/2018  Donnella Sham, PT Acute Rehabilitation Services [phone]  (pager) [phone]  (office)   Tessie Fass Adelita Hone 03/28/2018, 4:34 PM

## 2018-03-28 NOTE — Progress Notes (Addendum)
Family Medicine Teaching Service Daily Progress Note Intern Pager: [phone]  Patient name: [name] Medical record number: [id number] Date of birth: Jul 11, 1950 Age: 68 y.o. Gender: male  Primary Care Provider: Charlott Rakes, MD Consultants: Neuro Code Status: DNR  Pt Overview and Major Events to Date:  2/14 - admitted for new onset stroke  Assessment and Plan: Geza Beranek Burlesonis a 68 y.o.malepresenting with a one day history of left sided leg and face weakness (pre-existing left arm weakness)consistent with acute ischemic stroke in the right anterior hemisphere. PMH is significant formultiple CVA, DVT, CHF, polysubstance use disorder, dementia, GERD, Gout.   AcuteStrokewith left sided weakness, unchanged: MRI with anterior right hemisphere ischemia involving right ACA distribution, small foci present in MCA consistent with presentation.  Left arm and leg remain 3/5 strength, intact sensation. Patient declining substance abuse resources help with social work.  CT chest showing severe aortic atherosclerosis with approximately 50% mixed atherosclerotic stenosis of the origin of the brachiocephalic artery 7/09.  Echo 2/16 within normal limits, EF 60 to 65%. -Neuro signed off: Not a candidate for revascularization, appreciate recs -permissive HTN  -DAPT (aspirin and Plavix) -PT/OT -SW consult placed for SNF -tylenol PRN for pain  Right-sided pulmonary nodule: Chest x-ray 2/16 showing 17 mm nodule in the right midlung.  Follow-up CT chest showing lobulated 2.0 cm pulmonary nodule of right upper lobe with adjacent spiculation and architectural distortion highly suspicious for malignancy. -Further work-up can be performed outpatient, including PET-CT and percutaneous CT guided biopsy.  At minimum, recommend follow-up CT in 3 months to ensure stability.  Left sided abdominal pain, resolved: Pain most likely due to abdominal wall discomfort versus gas.  Chest x-ray revealing  left base atelectasis. -US showed mildly dilated AAA (f/u in 61yrs placed in discharge summary)  HTN, blood pressure 135/63, as high as 159/81: Allowing for permissive HTN. Patient is on Coreg at home. -PRN hydralazine for systolic greater than 628 and diastolic greater than 366.  -Avoid beta-blocker due to history of cocaine use.   COPD, stable- room air, patient takes dulera BID at home.Endorses constant cough. CTAB with upper airway noises transmitted on exm. - continue home med  Hx of seizure disorder.  Noted on previous hospital followup in December. He takes keppra 500 mg BID at home.  - continue home [place].   GERD- stable patient takes pantoprazole 40mg  at home. - continue home meds  Gout- no current flare. patient takes allopurinol 300mg  at home. - continue home meds  CHF- stable.  Patient takes carvedilol 3.125mg  BID. Will currently hold given cocaine use.  - Follow-up for repeat echo  Hx of DVT- DVT listed in problem list. Is not on anticoagulation currently because he is a fall risk.Patient is currently on Plavix and aspirin which is increases risk of bleeding. He has a history of paroxysmal atrial fibrillation with elevated chads-VASC score.  Alcohol abuse- CIWA 5<2<0<0.  patient states he only drinks one beer a month. EtOH neg on admission. - monitor CIWA scores for withdrawal, ativan as needed per protocol  Tobacco use disorder - Patient smokes ten cigars daily, though he states they are small cigars.  - nicotine patch 21mg .   Substance use disorder- patient admitted to smoking crack cocaine regularly on admission. UDS + cocaine. - social work consult   Dementia- likely vascular dementia from his history of CVA. Patient takes namenda at home.A&Ox3 this am. - continue home meds  Normocytic Anemia Hb 9.9 on admission,  Has been persistently ~11  since 01/2018. Alcohol use may contribute although prior to 01/2018 was wnl. No  evidence of bleeding on exam. - consider obtaining ferritin, iron panel if clinically concerned  FEN/GI:DYS 3 diet Prophylaxis:lovenox  Disposition: continue inpatient management for stroke, CIR declined, SNF consult with SW placed, may d/c once workup completed and neuro signs off  Subjective:  Patient seen sitting upright in bed, relaxing.  He has no issues or concerns at this time.  Objective: Temp:  [97.5 F (36.4 C)-98.5 F (36.9 C)] 97.9 F (36.6 C) (02/17 1931) Pulse Rate:  [73-87] 77 (02/17 1931) Resp:  [16-18] 16 (02/17 1931) BP: (135-159)/(63-81) 135/63 (02/17 1931) SpO2:  [91 %-97 %] 95 % (02/17 1931)  Physical Exam: General: No apparent distress, A&O x3 Cardiovascular: Regular rate and rhythm, S1-S2 present, no murmurs, rubs, gallops Respiratory: Upper airway sounds, otherwise clear to auscultation bilaterally Abdomen: Bowel sounds in 4 quadrants, denies tenderness to palpation, no masses Neuro: Alert and oriented, EOMI, speech slurring, 3/5 strength in left upper and lower extremities, intact sensation throughout  Laboratory: Recent Labs  Lab 03/26/18 0614 03/27/18 0811 03/28/18 0616  WBC 5.0 4.7 5.2  HGB 9.9* 9.6* 10.2*  HCT 34.7* 32.1* 34.5*  PLT 183 176 170   Recent Labs  Lab 03/25/18 1448  03/26/18 0614 03/27/18 0811 03/28/18 0616  NA 144  --  142 142 143  K 4.2  --  3.9 4.2 4.1  CL 110  --  110 111 110  CO2 25  --  26 26 26   BUN 11  --  8 8 9   CREATININE 0.92   < > 0.62 0.73 0.80  CALCIUM 9.7  --  9.0 9.3 9.2  PROT 6.7  --   --   --   --   BILITOT 0.4  --   --   --   --   ALKPHOS 66  --   --   --   --   ALT 14  --   --   --   --   AST 19  --   --   --   --   GLUCOSE 88  --  97 106* 111*   < > = values in this interval not displayed.   Imaging/Diagnostic Tests: Ct Chest W Contrast Result Date: 03/28/2018 IMPRESSION:  1. There is a lobulated 2.0 cm pulmonary nodule of the right upper lobe with subtle adjacent spiculation and  architectural distortion (series 7, image 56). This nodule is highly suspicious for malignancy. Given size and location, diagnostic options include PET-CT for metabolic characterization and percutaneous CT-guided biopsy. At minimum, recommend follow-up CT in 3 months to ensure stability.  2.  Coronary artery disease.  3. There is severe aortic atherosclerosis with approximately 50% mixed atherosclerotic stenosis of the origin of the brachiocephalic artery (series 3, image 37, series 5, image 73).   Daisy Floro, DO 03/28/2018, 7:39 PM PGY-1, [place] Intern pager: [phone], text pages welcome

## 2018-03-28 NOTE — Evaluation (Signed)
Speech Language Pathology Evaluation Patient Details Name: [name] MRN: [id number] DOB: [birth year] Today's Date: 03/28/2018 Time: 2831-5176 SLP Time Calculation (min) (ACUTE ONLY): 10 min  Problem List:  Patient Active Problem List   Diagnosis Date Noted  . Normocytic anemia 03/26/2018  . Abdominal pain, acute, left upper quadrant   . Dysarthria   . Recurrent abdominal pain   . Dementia due to another medical condition, without behavioral disturbance ([place])   . Pruritus   . Left hemiplegia ([place])   . Insomnia 01/29/2016  . Psoriasiform dermatitis 07/12/2015  . Type 2 diabetes mellitus with peripheral neuropathy ([place]) 03/13/2015  . History of Hemiparesis affecting left side as late effect of cerebrovascular accident ([place]) 02/18/2015  . Coronary artery disease involving native coronary artery of native heart without angina pectoris   . History of stroke   . PAF (paroxysmal atrial fibrillation) ([place]) 11/23/2014  . HLD (hyperlipidemia) 11/01/2014  . Tobacco use disorder 11/01/2014  . Carotid stenosis 11/01/2014  . Essential hypertension   . Polysubstance abuse ([place]) 08/28/2014  . Cocaine abuse ([place])   . Intracranial carotid stenosis   . Alcohol use disorder, moderate, dependence ([place])   . Stroke ([place])   . Chronic diastolic congestive heart failure ([place]) 11/21/2013  . GERD (gastroesophageal reflux disease) 08/10/2013  . Gout 08/10/2013  . Peripheral vascular disease ([place]) 07/27/2012  . COPD (chronic obstructive pulmonary disease) ([place]) 04/04/2012   Past Medical History:  Past Medical History:  Diagnosis Date  . Acute blood loss anemia   . Acute CVA (cerebrovascular accident) ([place]) 08/27/2014  . Acute encephalopathy 11/28/2017  . Acute ischemic stroke ([place])   . AKI (acute kidney injury) ([place])   . Alcohol abuse    H/O  . Alcohol dependence with withdrawal with complication ([place]) 1/60/7371  . Anxiety   . Arterial ischemic stroke, MCA (middle cerebral artery), right,  acute ([place])   . Arthritis   . Asthma   . Binocular vision disorder with diplopia 01/03/2013  . CAD (coronary artery disease) 05/27/10   Cath: severe single vessell CAD left cx midportion obtuse marginal 2 to 3.  . Cerebral infarction due to embolism of right middle cerebral artery ([place])   . Cerebral infarction due to stenosis of right carotid artery ([place]) 11/01/2014  . Cerebral thrombosis with cerebral infarction 07/18/2016  . Cerebrovascular accident (CVA) due to stenosis of right carotid artery ([place])   . CHF (congestive heart failure) ([place])   . Closed blow-out fracture of floor of orbit ([place]) 01/19/2013  . COPD (chronic obstructive pulmonary disease) ([place])   . COPD (chronic obstructive pulmonary disease) ([place])   . CVA (cerebral vascular accident) ([place]) 08/26/2014  . Dementia due to another medical condition ([place]) 07/12/2015  . Depression   . Diabetes mellitus type 2 in obese ([place]ell[place])   . Diabetes mellitus, type 2 ([place]) 03/13/2015  . Diastolic CHF, acute on chronic (HCC) 11/01/2014  . DOE (dyspnea on exertion) 11/23/2014  . DVT (deep venous thrombosis) ([place]) 02/22/2014  . Embolic stroke ([place])   . Enophthalmos due to trauma 01/19/2013  . Eyelid lesion 01/24/2013  . Fracture of inferior orbital wall (Al[place]) 01/03/2013  . Fracture of orbital floor (Joiner) 01/03/2013  . GERD (gastroesophageal reflux disease)   . H/O total knee replacement 10/24/2014  . HCAP (healthcare-associated pneumonia) 10/15/2015  . Headache around the eyes 08/27/2014  . History of DVT (deep vein thrombosis)   . History of DVT of lower extremity   . Hyperkalemia 10/24/2014  . Hyperlipemia   .  Hyperlipidemia   . Hypertension   . Hypoalbuminemia due to protein-calorie malnutrition ([place]ale)   . Hypotension   . Labile blood pressure   . Left shoulder pain 09/28/2017  . Lower extremity edema 02/21/2014  . Medial orbital wall fracture 01/19/2013  . Myocardial infarction (Poqu[place]on) 2000  . Orbital fracture 12/2012  . OSA on CPAP   . Pain  of left eye 01/03/2013  . Peripheral vascular disease, unspecified (Bosworth)    08/20/10 doppler: increase in right ABI post-op. Left ABI stable. S/P bi-fem bypass surgery  . Pneumonia 04/04/2012  . Right middle cerebral artery stroke ([place]) 01/18/2018  . Seizures (Crane)   . Shortness of breath   . Stroke (cerebrum) (Paulding) 02/06/2015  . Stroke ([place])   . Thrombocytopenia ([place]orn[place]ide)   . Tobacco abuse   . Trichiasis without entropion 04/16/2013   Overview:  LLL central   . Type 2 diabetes mellitus with peripheral neuropathy ([place] Pres[place]) 03/13/2015  . Urinary incontinence 03/14/2015  . Urinary incontinence 03/14/2015   Past Surgical History:  Past Surgical History:  Procedure Laterality Date  . abdoninal ao angio & bifem angio  05/27/10   Patent graft, occluded bil stents with no retrograde flow into the hypogastric arteries. 100% occl left ant. tibial artery, 70% to 80% to 100% stenosis right superficial fem artery above adductor canal. 100 % occl right ant. tibial vessell  . Aortogram w/ PTCA  09/292003  . BACK SURGERY    . CARDIAC CATHETERIZATION  05/27/10   severe CAD left cx  . CHOLECYSTECTOMY    . ESOPHAGOGASTRIC FUNDOPLICATION    . EYE SURGERY    . FEMORAL BYPASS  08/19/10   Right Fem-Pop  . IR ANGIO INTRA EXTRACRAN SEL COM CAROTID INNOMINATE BILAT MOD SED  07/20/2016  . IR ANGIO INTRA EXTRACRAN SEL COM CAROTID INNOMINATE BILAT MOD SED  01/14/2018  . IR ANGIO VERTEBRAL SEL SUBCLAVIAN INNOMINATE UNI R MOD SED  07/20/2016  . IR ANGIO VERTEBRAL SEL VERTEBRAL BILAT MOD SED  01/14/2018  . IR ANGIO VERTEBRAL SEL VERTEBRAL UNI L MOD SED  07/20/2016  . IR RADIOLOGIST EVAL & MGMT  05/27/2016  . IR US GUIDE VASC ACCESS RIGHT  01/14/2018  . JOINT REPLACEMENT    . PERIPHERAL VASCULAR CATHETERIZATION N/A 12/10/2014   Procedure: Abdominal Aortogram;  Surgeon: Angelia Mould, MD;  Location: [facility];  Service: Cardiovascular;  Laterality: N/A;  . RADIOLOGY WITH ANESTHESIA N/A 02/08/2015   Procedure:  RADIOLOGY WITH ANESTHESIA;  Surgeon: Luanne Bras, MD;  Location: [facility];  Service: Radiology;  Laterality: N/A;  . TEE WITHOUT CARDIOVERSION N/A 08/29/2014   Procedure: TRANSESOPHAGEAL ECHOCARDIOGRAM (TEE);  Surgeon: Josue Hector, MD;  Location: [facility] ENDOSCOPY;  Service: Cardiovascular;  Laterality: N/A;  . TOTAL KNEE ARTHROPLASTY     HPI:  Pt is a 68 y.o. male admitted 03/25/18 with L-side LE and face weakness; MRI showed acute ischemia in R ACA distribution with small foci in MCA. PMH includes prior stroke (residual LUE weakness), COPD, seizure disorder, DVT, CHF, polysubstance abuse, dementia. Of note, recent admission 01/2018 with R MCA infarct with d/c to CIR. Disicharged in dysphagia 3 diet with thin liquids.    Assessment / Plan / Recommendation Clinical Impression  Pt presents with cognitive linguistic abilities that are commensurate with baseline when discharged from CIR.  Pt's speech intelligibility continues to be > 90% intelligible at the simple conversation level. Pt's memory continues to be impaired but is considered at baseline. Would recommend discharge to  SNF for further medical management and to promote overall safety since pt lives alone. ST to sign off at this time.     SLP Assessment  SLP Recommendation/Assessment: Patient does not need any further Speech Lanaguage Pathology Services SLP Visit Diagnosis: Cognitive communication deficit (R41.841)    Follow Up Recommendations  Skilled Nursing facility    Frequency and Duration           SLP Evaluation Cognition  Overall Cognitive Status: History of cognitive impairments - at baseline Arousal/Alertness: Awake/alert Orientation Level: Oriented X4       Comprehension  Auditory Comprehension Overall Auditory Comprehension: Impaired at baseline Visual Recognition/Discrimination Discrimination: Not tested Reading Comprehension Reading Status: Not tested    Expression Expression Primary Mode of Expression:  Verbal Verbal Expression Overall Verbal Expression: Appears within functional limits for tasks assessed Written Expression Dominant Hand: Right Written Expression: Not tested   Oral / Motor  Oral Motor/Sensory Function Overall Oral Motor/Sensory Function: Mild impairment(at baseline) Motor Speech Overall Motor Speech: Impaired at baseline Respiration: Within functional limits Phonation: Normal Resonance: Within functional limits Articulation: Within functional limitis Intelligibility: Intelligible Motor Planning: Witnin functional limits Motor Speech Errors: Not applicable   GO                    Rondia Higginbotham 03/28/2018, 12:50 PM

## 2018-03-28 NOTE — Progress Notes (Signed)
PT Cancellation Note  Patient Details Name: FINLAY GODBEE MRN: [id number] DOB: Apr 27, 1950   Cancelled Treatment:    Reason Eval/Treat Not Completed: Patient at procedure or test/unavailable .   Gone to a procedure,  ?CT scan. 03/28/2018  Donnella Sham, PT Acute Rehabilitation Services [phone]  (pager) [phone]  (office)  Tessie Fass Alyiah Ulloa 03/28/2018, 2:18 PM

## 2018-03-29 LAB — CBC
HCT: 34 % — ABNORMAL LOW (ref 39.0–52.0)
Hemoglobin: 9.9 g/dL — ABNORMAL LOW (ref 13.0–17.0)
MCH: 24.4 pg — ABNORMAL LOW (ref 26.0–34.0)
MCHC: 29.1 g/dL — ABNORMAL LOW (ref 30.0–36.0)
MCV: 83.7 fL (ref 80.0–100.0)
Platelets: 159 10*3/uL (ref 150–400)
RBC: 4.06 MIL/uL — ABNORMAL LOW (ref 4.22–5.81)
RDW: 14.3 % (ref 11.5–15.5)
WBC: 5.2 10*3/uL (ref 4.0–10.5)
nRBC: 0 % (ref 0.0–0.2)

## 2018-03-29 MED ORDER — CAMPHOR-MENTHOL 0.5-0.5 % EX LOTN
TOPICAL_LOTION | CUTANEOUS | Status: DC | PRN
  Filled 2018-03-29: qty 222

## 2018-03-29 NOTE — Progress Notes (Addendum)
Family Medicine Teaching Service Daily Progress Note Intern Pager: [phone]  Patient name: [name] Medical record number: [id number] Date of birth: 1950-02-21 Age: 68 y.o. Gender: male  Primary Care Provider: Charlott Rakes, MD Consultants: Neuro Code Status: DNR  Pt Overview and Major Events to Date:  2/14 - admitted for new onset stroke  Assessment and Plan: [name] Burlesonis a 68 y.o.malepresenting with a one day history of left sided leg and face weakness (pre-existing left arm weakness)consistent with acute ischemic stroke in the right anterior hemisphere. PMH is significant formultiple CVA, DVT, CHF, polysubstance use disorder, dementia, GERD, Gout.   AcuteStrokewith left sided weakness, unchanged: MRI with anterior right hemisphere ischemia involving right ACA distribution, small foci present in MCA consistent with presentation.  Left arm and leg remain 3/5 strength, intact sensation. Patient declining substance abuse resources help with social work.  CT chest showing severe aortic atherosclerosis with approximately 50% mixed atherosclerotic stenosis of the origin of the brachiocephalic artery 4/94.  Echo 2/16 within normal limits, EF 60 to 65%. -Neuro signed off: Not a candidate for revascularization, appreciate recs -permissive HTN -DAPT (aspirin and Plavix) -PT/OT -recommending CIR -SW consult placed for SNF -Tylenol PRN for pain  Right-sided pulmonary nodule: Chest x-ray 2/16 showing 20.3 mm nodule in the right midlung.  Follow-up CT chest showing lobulated 2.0 cm pulmonary nodule of right upper lobe with adjacent spiculation and architectural distortion highly suspicious for malignancy. -Further work-up can be performed outpatient, including PET-CT and percutaneous CT guided biopsy.  At minimum, recommend follow-up CT in 3 months to ensure stability. -Recommend Multidisciplinary Thoracic Clinic outpatient follow up -Patient made aware of pulmonary  nodule and potential for cancer, prefers to focus on improving from his stroke before addressing lung nodule.  HTN, blood pressure 145/74: Allowing for permissive HTN. Patient is on Coreg at home. -PRN hydralazine for systolic greater than 496 and diastolic greater than 759.  -Avoid beta-blocker due to history of cocaine use.   COPD, stable-satting 96% on room air, patient takes dulera BID at home.Endorses constant cough. CTAB with upper airway noises transmitted on exm. - continue home med  Left sided abdominal pain, resolved: Pain most likely due to abdominal wall discomfort versus gas.  Chest x-ray revealing left base atelectasis. -US showed mildly dilated AAA (f/u in 18yrs placed in discharge summary)  Hx of seizure disorder.  Noted on previous hospital followup in December. He takes keppra 500 mg BID at home.  - continue home [place].  GERD-stablepatient takes pantoprazole 40mg  at home. - continue home meds  Gout-no current flare.patient takes allopurinol 300mg  at home. - continue home meds  CHF-stable.Patient takes carvedilol 3.125mg  BID. Will currently hold given cocaine use.  - Follow-up for repeat echo  Hx of DVT- DVT listed in problem list. Is not on anticoagulation currently because he is a fall risk.Patient is currently on Plavix and aspirin which is increases risk of bleeding. He has a history of paroxysmal atrial fibrillation with elevated chads-VASC score.  Alcohol abuse-CIWA 5<2<0<0.patient states he only drinks one beer a month. EtOH neg on admission. - monitor CIWA scores for withdrawal, ativan as needed per protocol  Tobacco use disorder - Patient smokes ten cigars daily, though he states they are small cigars.  - nicotine patch21mg .   Substance use disorder- patient admitted to smoking crack cocaine regularly on admission. UDS + cocaine. - social work consult   Dementia- likely vascular dementia from his history of CVA.  Patient takes namenda at home.A&Ox3 this  am. - continue home meds  Normocytic Anemia Hb9.9on admission, Has been persistently ~11 since 01/2018. Alcohol use may contribute although prior to 01/2018 was wnl. No evidence of bleeding on exam. - consider obtaining ferritin, iron panelif clinically concerned  FEN/GI:DYS 3 diet Prophylaxis:lovenox  Disposition: SNF consult with SW placed - patient is medically cleared for discharge  Subjective:  Patient is seen resting in bed, he has no complaints this morning.  Reports continued left-sided weakness.  Objective: Temp:  [97.5 F (36.4 C)-98.6 F (37 C)] 98.4 F (36.9 C) (02/18 0723) Pulse Rate:  [70-87] 70 (02/18 0723) Resp:  [16-18] 18 (02/18 0723) BP: (132-159)/(63-81) 145/74 (02/18 0723) SpO2:  [93 %-97 %] 96 % (02/18 0723) Physical Exam: General: No acute distress, alert and oriented x3 Cardiovascular: Regular rate and rhythm, S1-S2 present, no murmurs, rubs, gallops Respiratory: Refer to upper airway sounds, otherwise clear to auscultation bilaterally Abdomen: Nontender to palpation, nondistended, bowel sounds 4 quadrants Extremities: 3/5 strength in left arm and leg with plantar/dorsi flexion and hand grip. Sensation intact throughout.  Only minimal improvement from prior exam.  Laboratory: Recent Labs  Lab 03/27/18 0811 03/28/18 0616 03/29/18 0515  WBC 4.7 5.2 5.2  HGB 9.6* 10.2* 9.9*  HCT 32.1* 34.5* 34.0*  PLT 176 170 159   Recent Labs  Lab 03/25/18 1448  03/26/18 0614 03/27/18 0811 03/28/18 0616  NA 144  --  142 142 143  K 4.2  --  3.9 4.2 4.1  CL 110  --  110 111 110  CO2 25  --  26 26 26   BUN 11  --  8 8 9   CREATININE 0.92   < > 0.62 0.73 0.80  CALCIUM 9.7  --  9.0 9.3 9.2  PROT 6.7  --   --   --   --   BILITOT 0.4  --   --   --   --   ALKPHOS 66  --   --   --   --   ALT 14  --   --   --   --   AST 19  --   --   --   --   GLUCOSE 88  --  97 106* 111*   < > = values in this interval not  displayed.   Imaging/Diagnostic Tests: No new imaging  Daisy Floro, DO 03/29/2018, 7:54 AM PGY-1, [place] Intern pager: [phone], text pages welcome

## 2018-03-29 NOTE — Consult Note (Signed)
   [facility] CM Inpatient Consult   03/29/2018  BERGEN MAGNER [birth year] [id number]    Patient screened for potential [facility] Care Management services due to unplanned readmission risk score of 37% (extreme) and multiple hospitalizations.  Spoke with inpatient RNCM. Disposition plans are for SNF.     No identifiable [facility] Care Management needs at this time.   Marthenia Rolling, MSN-Ed, RN,BSN [facility] Liaison [phone]

## 2018-03-29 NOTE — Progress Notes (Signed)
Physical Therapy Treatment Patient Details Name: [name] MRN: [id number] DOB: Oct 26, 1950 Today's Date: 03/29/2018    History of Present Illness Pt is a 68 y.o. male admitted 03/25/18 with L-side LE and face weakness; MRI showed acute ischemia in R ACA distribution with small foci in MCA. PMH includes prior stroke (residual LUE weakness), COPD, seizure disorder, DVT, CHF, polysubstance abuse, dementia. Of note, recent admission 01/2018 with R MCA infarct with d/c to CIR.    PT Comments    Pt eager to participate.  Emphasis on trying to take assist away.  Worked on transitions, standing balance and gait training with RW.    Follow Up Recommendations  CIR;Supervision for mobility/OOB     Equipment Recommendations  Other (comment)    Recommendations for Other Services       Precautions / Restrictions Precautions Precautions: Fall Restrictions Weight Bearing Restrictions: No    Mobility  Bed Mobility Overal bed mobility: Needs Assistance Bed Mobility: Rolling;Sidelying to Sit;Sit to Supine Rolling: Min assist Sidelying to sit: Min assist   Sit to supine: Min assist;+2 for physical assistance      Transfers Overall transfer level: Needs assistance Equipment used: Rolling walker (2 wheeled) Transfers: Sit to/from Stand Sit to Stand: Min assist;+2 physical assistance         General transfer comment: cues for hand placement  Ambulation/Gait Ambulation/Gait assistance: Mod assist;+2 physical assistance Gait Distance (Feet): 50 Feet(x2) Assistive device: Rolling walker (2 wheeled) Gait Pattern/deviations: Step-to pattern Gait velocity: Decreased   General Gait Details: heavy paretic gait on the Left with moderate, but variable assist for w/shift and consistent assist to help swing through.  Pt's L hand was held to the RW also.   Stairs             Wheelchair Mobility    Modified Rankin (Stroke Patients Only) Modified Rankin (Stroke Patients  Only) Modified Rankin: Moderately severe disability     Balance Overall balance assessment: Needs assistance Sitting-balance support: No upper extremity supported;Feet supported Sitting balance-Leahy Scale: Fair       Standing balance-Leahy Scale: Poor Standing balance comment: Reliant on UE support                            Cognition Arousal/Alertness: Awake/alert Behavior During Therapy: WFL for tasks assessed/performed Overall Cognitive Status: History of cognitive impairments - at baseline                                        Exercises Other Exercises Other Exercises: warm up ROM exercise.    General Comments        Pertinent Vitals/Pain Pain Assessment: No/denies pain    Home Living                      Prior Function            PT Goals (current goals can now be found in the care plan section) Acute Rehab PT Goals Patient Stated Goal: Agreeble to post-acute rehab  PT Goal Formulation: With patient Time For Goal Achievement: 04/09/18 Potential to Achieve Goals: Good Progress towards PT goals: Progressing toward goals    Frequency    Min 4X/week      PT Plan Current plan remains appropriate    Co-evaluation  AM-PAC PT "6 Clicks" Mobility   Outcome Measure  Help needed turning from your back to your side while in a flat bed without using bedrails?: A Little Help needed moving from lying on your back to sitting on the side of a flat bed without using bedrails?: A Little Help needed moving to and from a bed to a chair (including a wheelchair)?: A Lot Help needed standing up from a chair using your arms (e.g., wheelchair or bedside chair)?: A Lot Help needed to walk in hospital room?: A Lot Help needed climbing 3-5 steps with a railing? : A Lot 6 Click Score: 14    End of Session   Activity Tolerance: Patient tolerated treatment well Patient left: in bed;with call bell/phone within  reach;with bed alarm set Nurse Communication: Mobility status PT Visit Diagnosis: Other abnormalities of gait and mobility (R26.89);Muscle weakness (generalized) (M62.81);Hemiplegia and hemiparesis Hemiplegia - Right/Left: Left Hemiplegia - dominant/non-dominant: Non-dominant Hemiplegia - caused by: Cerebral infarction     Time: 1557-1620 PT Time Calculation (min) (ACUTE ONLY): 23 min  Charges:  $Gait Training: 8-22 mins $Therapeutic Activity: 8-22 mins                     03/29/2018  Donnella Sham, PT [place] [phone]  (pager) [phone]  (office)   Tessie Fass Demeka Sutter 03/29/2018, 6:31 PM

## 2018-03-29 NOTE — Progress Notes (Signed)
CSW met with patient to discuss his concern of his wheelchair battery being delivered to his apartment while he's here at the hospital. Patient said that the social worker through [facility] had worked with him to order a new one, and it was supposed to be delivered on Saturday. CSW reached out to [facility] and confirmed that it appeared that it had been ordered and delivered, but home health was unable to assist while the patient was in the hospital. CSW provided information to the patient, and obtained the number for his apartment complex, [facility]. CSW contacted [employer] and left a voicemail for the Freight forwarder, Ms. Brooks, to ask if they could pick up the patient's package and hold it for him in the office until he gets home.   Patient continues to await placement at SNF, awaiting insurance approval. CSW to follow.  Laveda Abbe, [place] Clinical Social Worker [phone]

## 2018-03-29 NOTE — Care Management (Signed)
CM spoke to Mrs Ronnald Ramp at [facility] and they found patients package and placed it in his apartment. Pt notified.

## 2018-03-30 ENCOUNTER — Other Ambulatory Visit: Payer: Self-pay

## 2018-03-30 ENCOUNTER — Encounter: Payer: Self-pay | Admitting: *Deleted

## 2018-03-30 LAB — CBC
HCT: 34 % — ABNORMAL LOW (ref 39.0–52.0)
Hemoglobin: 9.9 g/dL — ABNORMAL LOW (ref 13.0–17.0)
MCH: 24.7 pg — ABNORMAL LOW (ref 26.0–34.0)
MCHC: 29.1 g/dL — ABNORMAL LOW (ref 30.0–36.0)
MCV: 84.8 fL (ref 80.0–100.0)
Platelets: 187 10*3/uL (ref 150–400)
RBC: 4.01 MIL/uL — ABNORMAL LOW (ref 4.22–5.81)
RDW: 14.4 % (ref 11.5–15.5)
WBC: 5.7 10*3/uL (ref 4.0–10.5)
nRBC: 0 % (ref 0.0–0.2)

## 2018-03-30 MED ORDER — CAMPHOR-MENTHOL 0.5-0.5 % EX LOTN: TOPICAL_LOTION | CUTANEOUS | 0 refills | Status: DC | PRN

## 2018-03-30 MED ORDER — CLINDAMYCIN HCL 300 MG PO CAPS
300.0000 mg | ORAL_CAPSULE | Freq: Four times a day (QID) | ORAL | Status: DC
  Administered 2018-03-30 – 2018-03-31 (×2): 300 mg via ORAL
  Filled 2018-03-30 (×3): qty 1

## 2018-03-30 MED ORDER — LOSARTAN POTASSIUM 25 MG PO TABS: 25.0000 mg | ORAL_TABLET | Freq: Once | ORAL | Status: DC

## 2018-03-30 NOTE — Progress Notes (Signed)
Oncology Nurse Navigator Documentation  Oncology Nurse Navigator Flowsheets 03/30/2018  Navigator Location [facility]  Referral date to RadOnc/MedOnc 03/30/2018  Navigator Encounter Type Telephone/I received referral on Mr. Vandegrift today.  Dr. Grandville Silos states patient has lung mass but no work up and still in the hospital.  I update her that patient will be scheduled after he leaves hospital.  I updated new patient coordinator to call patient and schedule him to be seen with Dr. Julien Nordmann 04/12/2018.    Telephone Incoming Call  Treatment Phase Abnormal Scans  Barriers/Navigation Needs Coordination of Care  Interventions Coordination of Care  Coordination of Care Other  Acuity Level 2  Time Spent with Patient 30

## 2018-03-30 NOTE — Clinical Social Work Placement (Signed)
   CLINICAL SOCIAL WORK PLACEMENT  NOTE  Date:  03/30/2018  Patient Details  Name: [name] MRN: [id number] Date of Birth: 02/18/1950  Clinical Social Work is seeking post-discharge placement for this patient at the [place] level of care (*CSW will initial, date and re-position this form in  chart as items are completed):      Patient/family provided with [facility] Work Department's list of facilities offering this level of care within the geographic area requested by the patient (or if unable, by the patient's family).  Yes   Patient/family informed of their freedom to choose among providers that offer the needed level of care, that participate in Medicare, Medicaid or managed care program needed by the patient, have an available bed and are willing to accept the patient.      Patient/family informed of [facility]'s ownership interest in [facility] and [facility], as well as of the fact that they are under no obligation to receive care at these facilities.  PASRR submitted to EDS on       PASRR number received on 03/30/18     Existing PASRR number confirmed on 03/30/18     FL2 transmitted to all facilities in geographic area requested by pt/family on 03/30/18     FL2 transmitted to all facilities within larger geographic area on       Patient informed that his/her managed care company has contracts with or will negotiate with certain facilities, including the following:        Yes   Patient/family informed of bed offers received.  Patient chooses bed at [facility]     Physician recommends and patient chooses bed at      Patient to be transferred to [facility] on 03/30/18.  Patient to be transferred to facility by [facility]     Patient family notified on 03/30/18 of transfer.  Name of family member notified:  pt alert and oriented     PHYSICIAN       Additional Comment:     _______________________________________________ Eileen Stanford, LCSW 03/30/2018, 2:32 PM

## 2018-03-30 NOTE — Progress Notes (Signed)
Patient's discharge is cancelled due to concern about possible infection of IV site.

## 2018-03-30 NOTE — Progress Notes (Signed)
Family Medicine Teaching Service Daily Progress Note Intern Pager: [phone]  Patient name: [name] Medical record number: [id number] Date of birth: April 28, 1950 Age: 69 y.o. Gender: male  Primary Care Provider: Charlott Rakes, MD Consultants: Neuro Code Status: DNR  Pt Overview and Major Events to Date:  2/14 - admitted for new onset stroke  Assessment and Plan: Kenneth Sato Burlesonis a 68 y.o.malepresenting with a one day history of left sided leg and face weakness (pre-existing left arm weakness)consistent with acute ischemic stroke in the right anterior hemisphere. PMH is significant formultiple CVA, DVT, CHF, polysubstance use disorder, dementia, GERD, Gout.   AcuteStrokewith left sided weakness,unchanged:MRI with anterior right hemisphere ischemia involving right ACA distribution, small foci present in MCA consistent with presentation. Left arm and leg remain 3/5 strength, intact sensation. Patient declining substance abuse resources help with social work. CT chest showing severe aortic atherosclerosis with approximately 50% mixed atherosclerotic stenosis of the origin of the brachiocephalic artery 2/99. Echo 2/16 within normal limits, EF 60 to 65%. -Neuro signed off: Not a candidate for revascularization,appreciate recs. Continue DAPT. -permissive HTNthrough 2/21 -DAPT(aspirin and Plavix) -PT/OT - recommending CIR -SW consult placed for SNF -Tylenol PRN for pain  Right-sided pulmonary nodule: Chest x-ray 2/16 showing20.3 mm nodule in the right midlung. Follow-up CT chest showing lobulated 2.0 cm pulmonary nodule of right upper lobe with adjacent spiculation and architectural distortion highly suspicious for malignancy. -Further work-up can be performed outpatient, including PET-CT and percutaneous CT guided biopsy. At minimum, recommend follow-up CT in 3 months to ensure stability. -Recommend Multidisciplinary Thoracic Oncology Clinic outpatient follow  up -Patient made aware of pulmonary nodule and potential for cancer, prefers to focus on improving from his stroke before addressing lung nodule.  HTN,blood pressure: 153/66 2/19,Allowing for permissive HTN.Patient is on Coreg at home. -PRN hydralazine for systolic greater than 242 and diastolic greater than 683. -Avoid beta-blocker due to history of cocaine use.  COPD, stable-satting 96% on room air, patient takes dulera BID at home.Endorses constant cough. CTAB with upper airway noises transmitted on exm. - continue home med  Left sided abdominal pain, resolved:Pain most likely due to abdominal wall discomfort versus gas. Chest x-ray revealing left base atelectasis. -US showed mildly dilated AAA (f/u in 88yrs placed in discharge summary)  Hx of seizure disorder. Noted on previous hospital followup in December. He takes keppra 500 mg BID at home.  - continue home [place].  GERD-stablepatient takes pantoprazole 40mg  at home. - continue home meds  Gout-no current flare.patient takes allopurinol 300mg  at home. - continue home meds  CHF-stable.Patient takes carvedilol 3.125mg  BID. Will currently hold given cocaine use.  - Follow-up for repeat echo  Hx of DVT- DVT listed in problem list. Is not on anticoagulation currently because he is a fall risk.Patient is currently on Plavix and aspirin which is increases risk of bleeding. He has a history of paroxysmal atrial fibrillation with elevated chads-VASC score.  Alcohol abuse-CIWA 5<2<0<0.patient states he only drinks one beer a month. EtOH neg on admission. - monitor CIWA scores for withdrawal, ativan as needed per protocol  Tobacco use disorder - Patient smokes ten cigars daily, though he states they are small cigars.  - nicotine patch21mg .   Substance use disorder- patient admitted to smoking crack cocaine regularly on admission. UDS + cocaine. - social work consult   Dementia-  likely vascular dementia from his history of CVA. Patient takes namenda at home.A&Ox3 this am. - continue home meds  Normocytic Anemia Hb9.9on admission, Has been  persistently ~11 since 01/2018. Alcohol use may contribute although prior to 01/2018 was wnl. No evidence of bleeding on exam. - consider obtaining ferritin, iron panelif clinically concerned  FEN/GI:DYS 3 diet Prophylaxis:lovenox  Disposition: SNF consult with SW placed - patient is medically cleared for discharge  Subjective:  Patient resting comfortable in bed, in no apparent distress. Eating breakfast. No concerns or questions at this time.  Objective: Temp:  [97.5 F (36.4 C)-98.3 F (36.8 C)] 98.1 F (36.7 C) (02/19 0726) Pulse Rate:  [66-88] 66 (02/19 0726) Resp:  [18-20] 20 (02/19 0726) BP: (130-155)/(57-78) 153/66 (02/19 0726) SpO2:  [94 %-97 %] 94 % (02/19 0834) Physical Exam: General: NAD, pleasant gentleman Cardiovascular: RRR, S1S2 present, no murmurs, rubs or gallops Respiratory: Coarse breath sounds bilaterally, unchanged from previous exam Abdomen: Obese abdomen, normal bowel sounds throughout Extremities: Improved strength in lower Left extremity now up to 4/5, sensation remains intact throughout  Laboratory: Recent Labs  Lab 03/28/18 0616 03/29/18 0515 03/30/18 0417  WBC 5.2 5.2 5.7  HGB 10.2* 9.9* 9.9*  HCT 34.5* 34.0* 34.0*  PLT 170 159 187   Recent Labs  Lab 03/25/18 1448  03/26/18 0614 03/27/18 0811 03/28/18 0616  NA 144  --  142 142 143  K 4.2  --  3.9 4.2 4.1  CL 110  --  110 111 110  CO2 25  --  26 26 26   BUN 11  --  8 8 9   CREATININE 0.92   < > 0.62 0.73 0.80  CALCIUM 9.7  --  9.0 9.3 9.2  PROT 6.7  --   --   --   --   BILITOT 0.4  --   --   --   --   ALKPHOS 66  --   --   --   --   ALT 14  --   --   --   --   AST 19  --   --   --   --   GLUCOSE 88  --  97 106* 111*   < > = values in this interval not displayed.   Imaging/Diagnostic Tests: No new  imaging  Daisy Floro, DO 03/30/2018, 9:23 AM PGY-1, [place] Intern pager: [phone], text pages welcome

## 2018-03-30 NOTE — Clinical Social Work Note (Signed)
Pt has authorization and can d/c to [facility] today.   [name], [name]

## 2018-03-30 NOTE — Progress Notes (Signed)
Occupational Therapy Treatment Patient Details Name: [name] MRN: [id number] DOB: 01-06-1951 Today's Date: 03/30/2018    History of present illness Pt is a 68 y.o. male admitted 03/25/18 with L-side LE and face weakness; MRI showed acute ischemia in R ACA distribution with small foci in MCA. PMH includes prior stroke (residual LUE weakness), COPD, seizure disorder, DVT, CHF, polysubstance abuse, dementia. Of note, recent admission 01/2018 with R MCA infarct with d/c to CIR.   OT comments  Pt supine in bed and requesting to transfer into recliner chair. Pt standing with mod lifting assistance and then needing max multimodal cuing to direction RW and advancement of feet in order to transfer into recliner chair. NMR to L UE this session with self ROM and PROM in all planes. Pt reports some discomfort with stretching this session. Pt remained in recliner chair at end of session with call bell and all needed items within reach.   Follow Up Recommendations  SNF    Equipment Recommendations  Other (comment)(defer to next venue of care)    Recommendations for Other Services      Precautions / Restrictions Precautions Precautions: Fall       Mobility Bed Mobility Overal bed mobility: Needs Assistance Bed Mobility: Rolling;Sidelying to Sit;Sit to Supine Rolling: Min assist Sidelying to sit: Min assist   Sit to supine: Min assist   General bed mobility comments: cuing for proper technique and safety  Transfers Overall transfer level: Needs assistance Equipment used: Rolling walker (2 wheeled) Transfers: Sit to/from Stand Sit to Stand: Mod assist         General transfer comment: mod lifting assistance    Balance Overall balance assessment: Needs assistance Sitting-balance support: No upper extremity supported;Feet supported Sitting balance-Leahy Scale: Fair       Standing balance-Leahy Scale: Poor Standing balance comment: Reliant on UE support          ADL  either performed or assessed with clinical judgement           Cognition Arousal/Alertness: Awake/alert Behavior During Therapy: WFL for tasks assessed/performed Overall Cognitive Status: History of cognitive impairments - at baseline Area of Impairment: Attention;Memory;Following commands;Safety/judgement;Awareness;Problem solving         Current Attention Level: Selective Memory: Decreased short-term memory Following Commands: Follows one step commands consistently;Follows one step commands with increased time Safety/Judgement: Decreased awareness of deficits;Decreased awareness of safety Awareness: Emergent Problem Solving: Slow processing;Requires verbal cues;Requires tactile cues                     Pertinent Vitals/ Pain       Pain Assessment: No/denies pain         Frequency  Min 2X/week        Progress Toward Goals  OT Goals(current goals can now be found in the care plan section)  Progress towards OT goals: Progressing toward goals  Acute Rehab OT Goals Patient Stated Goal: none stated this session  Plan Discharge plan needs to be updated       AM-PAC OT "6 Clicks" Daily Activity     Outcome Measure   Help from another person eating meals?: A Little Help from another person taking care of personal grooming?: A Little Help from another person toileting, which includes using toliet, bedpan, or urinal?: A Lot Help from another person bathing (including washing, rinsing, drying)?: A Lot Help from another person to put on and taking off regular upper body clothing?: A Lot Help from another person  to put on and taking off regular lower body clothing?: A Lot 6 Click Score: 14    End of Session Equipment Utilized During Treatment: Rolling walker  OT Visit Diagnosis: Unsteadiness on feet (R26.81);Other abnormalities of gait and mobility (R26.89);Muscle weakness (generalized) (M62.81);Other symptoms and signs involving cognitive function;Hemiplegia and  hemiparesis Hemiplegia - Right/Left: Left Hemiplegia - dominant/non-dominant: Non-Dominant Hemiplegia - caused by: Cerebral infarction   Activity Tolerance Patient tolerated treatment well   Patient Left in chair;with call bell/phone within reach;with chair alarm set   Nurse Communication Mobility status        Time: 7616-0737 OT Time Calculation (min): 15 min  Charges: OT General Charges $OT Visit: 1 Visit OT Treatments $Neuromuscular Re-education: 8-22 mins   Dreshawn Hendershott P, MS, OTR/L 03/30/2018, 3:27 PM

## 2018-03-30 NOTE — Progress Notes (Signed)
Report given to [place] at [facility].

## 2018-03-31 DIAGNOSIS — T827XXA Infection and inflammatory reaction due to other cardiac and vascular devices, implants and grafts, initial encounter: Secondary | ICD-10-CM | POA: Diagnosis not present

## 2018-03-31 DIAGNOSIS — R29898 Other symptoms and signs involving the musculoskeletal system: Secondary | ICD-10-CM

## 2018-03-31 MED ORDER — LOSARTAN POTASSIUM 25 MG PO TABS
25.0000 mg | ORAL_TABLET | Freq: Every day | ORAL | Status: DC
  Administered 2018-03-31 – 2018-04-01 (×2): 25 mg via ORAL
  Filled 2018-03-31 (×2): qty 1

## 2018-03-31 MED ORDER — DOXYCYCLINE HYCLATE 100 MG PO TABS
100.0000 mg | ORAL_TABLET | Freq: Two times a day (BID) | ORAL | Status: DC
  Administered 2018-03-31 – 2018-04-01 (×2): 100 mg via ORAL
  Filled 2018-03-31 (×2): qty 1

## 2018-03-31 MED ORDER — SULFAMETHOXAZOLE-TRIMETHOPRIM 800-160 MG PO TABS: 1.0000 | ORAL_TABLET | Freq: Two times a day (BID) | ORAL | Status: DC

## 2018-03-31 MED ORDER — DOXYCYCLINE HYCLATE 100 MG PO TABS: 100.0000 mg | ORAL_TABLET | Freq: Two times a day (BID) | ORAL | Status: DC

## 2018-03-31 NOTE — Progress Notes (Signed)
Physical Therapy Treatment Patient Details Name: [name] MRN: [id number] DOB: [birth year] Today's Date: 03/31/2018    History of Present Illness Pt is a 68 y.o. male admitted 03/25/18 with L-side LE and face weakness; MRI showed acute ischemia in R ACA distribution with small foci in MCA. PMH includes prior stroke (residual LUE weakness), COPD, seizure disorder, DVT, CHF, polysubstance abuse, dementia. Of note, recent admission 01/2018 with R MCA infarct with d/c to CIR.    PT Comments    Slow improvement managing basic mobility with less assist.  Emphasis on bed mobility, sit to stand, transfer on/off toilet and gait training in the RW with w/shift and assist advancing the  L LE.    Follow Up Recommendations  SNF;Supervision/Assistance - 24 hour     Equipment Recommendations  Other (comment)(TBA)    Recommendations for Other Services       Precautions / Restrictions Precautions Precautions: Fall    Mobility  Bed Mobility Overal bed mobility: Needs Assistance Bed Mobility: Rolling;Sidelying to Sit;Sit to Sidelying Rolling: Min assist Sidelying to sit: Min assist     Sit to sidelying: Min assist General bed mobility comments: cuing for proper technique and safety  Transfers Overall transfer level: Needs assistance Equipment used: Rolling walker (2 wheeled) Transfers: Sit to/from Stand Sit to Stand: Mod assist         General transfer comment: cues for technique, assist to come both forward and up.  Ambulation/Gait Ambulation/Gait assistance: Mod assist;+2 physical assistance Gait Distance (Feet): 60 Feet Assistive device: Rolling walker (2 wheeled) Gait Pattern/deviations: Step-to pattern Gait velocity: Decreased   General Gait Details: heavy paretic gait on the Left with moderate, but variable assist for w/shift and consistent assist to help swing through.  Pt's L hand was held to the RW also.   Stairs             Wheelchair Mobility     Modified Rankin (Stroke Patients Only) Modified Rankin (Stroke Patients Only) Pre-Morbid Rankin Score: Slight disability Modified Rankin: Moderately severe disability     Balance Overall balance assessment: Needs assistance   Sitting balance-Leahy Scale: Fair       Standing balance-Leahy Scale: Poor Standing balance comment: Reliant on UE support                            Cognition Arousal/Alertness: Awake/alert Behavior During Therapy: WFL for tasks assessed/performed Overall Cognitive Status: History of cognitive impairments - at baseline Area of Impairment: Attention;Memory;Following commands;Safety/judgement;Awareness;Problem solving                   Current Attention Level: Selective Memory: Decreased short-term memory Following Commands: Follows one step commands consistently Safety/Judgement: Decreased awareness of deficits;Decreased awareness of safety Awareness: Emergent Problem Solving: Slow processing;Requires verbal cues;Requires tactile cues        Exercises      General Comments General comments (skin integrity, edema, etc.): toilet transifer min assist      Pertinent Vitals/Pain Pain Assessment: No/denies pain    Home Living                      Prior Function            PT Goals (current goals can now be found in the care plan section) Acute Rehab PT Goals PT Goal Formulation: With patient Time For Goal Achievement: 04/09/18 Potential to Achieve Goals: Good Progress towards PT goals: Progressing toward  goals    Frequency    Min 3X/week      PT Plan Current plan remains appropriate;Frequency needs to be updated    Co-evaluation              AM-PAC PT "6 Clicks" Mobility   Outcome Measure  Help needed turning from your back to your side while in a flat bed without using bedrails?: A Little Help needed moving from lying on your back to sitting on the side of a flat bed without using bedrails?:  A Little Help needed moving to and from a bed to a chair (including a wheelchair)?: A Lot Help needed standing up from a chair using your arms (e.g., wheelchair or bedside chair)?: A Lot Help needed to walk in hospital room?: A Lot Help needed climbing 3-5 steps with a railing? : A Lot 6 Click Score: 14    End of Session     Patient left: in bed;with call bell/phone within reach;with bed alarm set Nurse Communication: Mobility status PT Visit Diagnosis: Other abnormalities of gait and mobility (R26.89);Muscle weakness (generalized) (M62.81);Hemiplegia and hemiparesis Hemiplegia - Right/Left: Left Hemiplegia - dominant/non-dominant: Non-dominant Hemiplegia - caused by: Cerebral infarction     Time: 4270-6237 PT Time Calculation (min) (ACUTE ONLY): 26 min  Charges:  $Gait Training: 8-22 mins $Therapeutic Activity: 8-22 mins                     03/31/2018  Donnella Sham, PT Acute Rehabilitation Services [phone]  (pager) [phone]  (office)   [name] 03/31/2018, 5:25 PM

## 2018-03-31 NOTE — Progress Notes (Addendum)
Family Medicine Teaching Service Daily Progress Note Intern Pager: [phone]  Patient name: [name] Medical record number: [id number] Date of birth: 10-27-1950 Age: 68 y.o. Gender: male  Primary Care Provider: Charlott Rakes, MD Consultants: Neuro Code Status: DNR  Pt Overview and Major Events to Date:  2/14 - admitted for new onset stroke  Assessment and Plan: Maddux Vanscyoc Burlesonis a 68 y.o.malepresenting with a one day history of left sided leg and face weakness (pre-existing left arm weakness)consistent with acute ischemic stroke in the right anterior hemisphere. PMH is significant formultiple CVA, DVT, CHF, polysubstance use disorder, dementia, GERD, Gout.   Cellulitis: Noted to right cubital fossa where IV was placed.  Prohibited patient from being transferred to [facility] health care.  Patient remains afebrile, blood pressures within normal range, vitals stable. Patient was started on Clindamycin. -Switch Clinda to Doxycycline 100mg  BID x 3 days -monitor for fevers  AcuteStrokewith left sided weakness,improved:MRI with anterior right hemisphere ischemia involving right ACA distribution, small foci present in MCA consistent with presentation. Left arm and leg remain 4/5 strength, intact sensation.  -Continue DAPT -permissive HTNthrough 2/21 -DAPT(aspirin and Plavix) -PT/OT -SW consult placed for SNF -Tylenol PRN for pain  Right-sided pulmonary nodule: Chest x-ray 2/16 showing20.54mm nodule in the right midlung. Follow-up CT chest showing lobulated 2.0 cm pulmonary nodule of right upper lobe with adjacent spiculation and architectural distortion highly suspicious for malignancy. -Further work-up can be performed outpatient, including PET-CT and percutaneous CT guided biopsy. At minimum, recommend follow-up CT in 3 months to ensure stability. -Recommend Multidisciplinary Thoracic Oncology Clinic outpatient follow up -Patient made aware of pulmonary  nodule and potential for cancer, prefers to focus on improving from his stroke before addressing lung nodule.  HTN,blood pressure:  134/59 2/20,Allowing for permissive HTN.Patient is on Coreg at home. -PRN hydralazine for systolic greater than 644 and diastolic greater than 034. -Discontinue Coreg -Start Losartan 25mg  daily -Avoid beta-blocker outpatient due to history of cocaine use.  COPD, stable:satting 95% on room air,patient takes dulera BID at home.Endorses constant cough. CTAB with upper airway noises transmitted on exm. - continue home med  Left sided abdominal pain, resolved:Pain most likely due to abdominal wall discomfort versus gas. Chest x-ray revealing left base atelectasis. -US showed mildly dilated AAA (f/u in 60yrs placed in discharge summary)  Hx of seizure disorder. Noted on previous hospital followup in December. He takes keppra 500 mg BID at home.  - continue home [place].  GERD-stablepatient takes pantoprazole 40mg  at home. - continue home meds  Gout-no current flare.patient takes allopurinol 300mg  at home. - continue home meds  CHF-stable.Patient takes carvedilol 3.125mg  BID. Will currently hold given cocaine use.   Hx of DVT- DVT listed in problem list. Is not on anticoagulation currently because he is a fall risk.Patient is currently on Plavix and aspirin which is increases risk of bleeding. He has a history of paroxysmal atrial fibrillation with elevated chads-VASC score.  Alcohol abuse-CIWA 5<2<0<0.patient states he only drinks one beer a month. EtOH neg on admission. - monitor CIWA scores for withdrawal, ativan as needed per protocol  Tobacco use disorder - Patient smokes ten cigars daily, though he states they are small cigars.  - nicotine patch21mg .   Substance use disorder- patient admitted to smoking crack cocaine regularly on admission. UDS + cocaine. - social work consult   Dementia- likely  vascular dementia from his history of CVA. Patient takes namenda at home.A&Ox3 this am. - continue home meds  Normocytic Anemia Hb9.9on admission,  Has been persistently ~11 since 01/2018. Alcohol use may contribute although prior to 01/2018 was wnl. No evidence of bleeding on exam. - consider obtaining ferritin, iron panelif clinically concerned  FEN/GI:DYS 3 diet Prophylaxis:lovenox  Disposition: Discharge in 1 to 2 days once medically cleared  Subjective:  Patient seen resting, sitting upright in bed eating his breakfast with his right hand.  He has no questions or concerns at this time.  Denies fevers, chills, body aches, chest pain, shortness of breath, nausea, vomiting, diarrhea, and constipation.  He denies new weaknesses.  Objective: Temp:  [97.8 F (36.6 C)-99.1 F (37.3 C)] 97.9 F (36.6 C) (02/20 0358) Pulse Rate:  [72-78] 75 (02/20 0358) Resp:  [16-20] 18 (02/20 0358) BP: (124-140)/(59-68) 134/59 (02/20 0358) SpO2:  [94 %-97 %] 95 % (02/20 0358) Physical Exam: General: No apparent distress Cardiovascular: Regular rate rhythm, S1-S2 present, no murmurs, rubs, gallops Respiratory: Clear to auscultation bilaterally, difficult to auscultate secondary to upper airway sounds Abdomen: Sounds 4 quadrants Extremities: 5/5 strength in his right lower extremity, 3/5-4/5 strength with extension and flexion in his left lower extremity throughout.  Sensation intact throughout.  Residual left-sided facial droop. Right cubital fossa with cellulitic rash, barrier has been marked.  Laboratory: Recent Labs  Lab 03/28/18 0616 03/29/18 0515 03/30/18 0417  WBC 5.2 5.2 5.7  HGB 10.2* 9.9* 9.9*  HCT 34.5* 34.0* 34.0*  PLT 170 159 187   Recent Labs  Lab 03/25/18 1448  03/26/18 0614 03/27/18 0811 03/28/18 0616  NA 144  --  142 142 143  K 4.2  --  3.9 4.2 4.1  CL 110  --  110 111 110  CO2 25  --  26 26 26   BUN 11  --  8 8 9   CREATININE 0.92   < > 0.62 0.73 0.80   CALCIUM 9.7  --  9.0 9.3 9.2  PROT 6.7  --   --   --   --   BILITOT 0.4  --   --   --   --   ALKPHOS 66  --   --   --   --   ALT 14  --   --   --   --   AST 19  --   --   --   --   GLUCOSE 88  --  97 106* 111*   < > = values in this interval not displayed.   Imaging/Diagnostic Tests: No new imaging  Daisy Floro, DO 03/31/2018, 7:52 AM PGY-1, [place] Intern pager: [phone], text pages welcome

## 2018-03-31 NOTE — Progress Notes (Signed)
FPTS Interim Progress Note  S: Paged by nurse about redness and pain at IV site.  Patient reports that IV site has had pain for the last few hours but also endorses that he noticed the redness and swelling a couple of days ago.   O: BP 140/62 (BP Location: Left Arm)   Pulse 78   Temp 97.8 F (36.6 C) (Oral)   Resp 18   Ht 6' (1.829 m)   Wt 101.5 kg   SpO2 96%   BMI 30.35 kg/m    General: alert, NAD  Skin: 3" area of erythema, warmth and induration at R AC fossa. Serosanguinous fluid drains from IV puncture site when pressure applied to indurated areas. No areas of fluctuance. Pt reports pain with palpation, but not to touch.   A/P: This is a 68 year old male admitted for acute ischemic stroke of right anterior hemisphere who, as he was being prepared for transfer to SNF, was found to have an area of erythema and induration at right antecubital fossa concerning for infection by EMS.  Patient reports having pain at the site for a couple of hours but has also noticed swelling over the last couple days.  Per chart review, patient has not received any medications by IV since 2/15, though it was used for contrast administration on 2/17.  PIV site was established 5 days ago with daily charting by nursing staff that site is clean and intact; with last flush charted at 2/19 at 0800.   SNF was called and was signed out on this new finding, however, due to concern of new infection, patient was not transferred.  We will start p.o. antibiotics for cellulitis and continue to monitor.  SNF to accept once cellulitis site shows improvement, per nurse at SNF.  #IV Site Infection  Started p.o. clindamycin for likely 7-day course  PRN tylenol for pain  Continue to monitor site daily  Safety zone portal submitted  AM CBC w/ diff  #Disposition   Discontinue discharge orders  Plan to discharge to SNF in 1 to 2 days  Resume all medications and plan per progress note earlier today.  Wilber Oliphant, MD 03/31/2018, 12:50 AM PGY-1, [place] Medicine Service pager [phone]

## 2018-04-01 DIAGNOSIS — I771 Stricture of artery: Secondary | ICD-10-CM | POA: Diagnosis not present

## 2018-04-01 DIAGNOSIS — J449 Chronic obstructive pulmonary disease, unspecified: Secondary | ICD-10-CM | POA: Diagnosis not present

## 2018-04-01 DIAGNOSIS — I5033 Acute on chronic diastolic (congestive) heart failure: Secondary | ICD-10-CM | POA: Diagnosis not present

## 2018-04-01 DIAGNOSIS — M6281 Muscle weakness (generalized): Secondary | ICD-10-CM | POA: Diagnosis not present

## 2018-04-01 DIAGNOSIS — I714 Abdominal aortic aneurysm, without rupture: Secondary | ICD-10-CM | POA: Diagnosis not present

## 2018-04-01 DIAGNOSIS — G479 Sleep disorder, unspecified: Secondary | ICD-10-CM | POA: Diagnosis not present

## 2018-04-01 DIAGNOSIS — R222 Localized swelling, mass and lump, trunk: Secondary | ICD-10-CM | POA: Diagnosis not present

## 2018-04-01 DIAGNOSIS — R52 Pain, unspecified: Secondary | ICD-10-CM | POA: Diagnosis not present

## 2018-04-01 DIAGNOSIS — M2550 Pain in unspecified joint: Secondary | ICD-10-CM | POA: Diagnosis not present

## 2018-04-01 DIAGNOSIS — R471 Dysarthria and anarthria: Secondary | ICD-10-CM | POA: Diagnosis not present

## 2018-04-01 DIAGNOSIS — G4701 Insomnia due to medical condition: Secondary | ICD-10-CM | POA: Diagnosis not present

## 2018-04-01 DIAGNOSIS — R2681 Unsteadiness on feet: Secondary | ICD-10-CM | POA: Diagnosis not present

## 2018-04-01 DIAGNOSIS — J039 Acute tonsillitis, unspecified: Secondary | ICD-10-CM | POA: Diagnosis not present

## 2018-04-01 DIAGNOSIS — I2510 Atherosclerotic heart disease of native coronary artery without angina pectoris: Secondary | ICD-10-CM | POA: Diagnosis not present

## 2018-04-01 DIAGNOSIS — I480 Paroxysmal atrial fibrillation: Secondary | ICD-10-CM | POA: Diagnosis not present

## 2018-04-01 DIAGNOSIS — E118 Type 2 diabetes mellitus with unspecified complications: Secondary | ICD-10-CM | POA: Diagnosis not present

## 2018-04-01 DIAGNOSIS — M1000 Idiopathic gout, unspecified site: Secondary | ICD-10-CM | POA: Diagnosis not present

## 2018-04-01 DIAGNOSIS — Z7401 Bed confinement status: Secondary | ICD-10-CM | POA: Diagnosis not present

## 2018-04-01 DIAGNOSIS — Z8673 Personal history of transient ischemic attack (TIA), and cerebral infarction without residual deficits: Secondary | ICD-10-CM | POA: Diagnosis not present

## 2018-04-01 DIAGNOSIS — G9340 Encephalopathy, unspecified: Secondary | ICD-10-CM | POA: Diagnosis not present

## 2018-04-01 DIAGNOSIS — I959 Hypotension, unspecified: Secondary | ICD-10-CM | POA: Diagnosis not present

## 2018-04-01 DIAGNOSIS — I10 Essential (primary) hypertension: Secondary | ICD-10-CM | POA: Diagnosis not present

## 2018-04-01 DIAGNOSIS — R911 Solitary pulmonary nodule: Secondary | ICD-10-CM | POA: Diagnosis not present

## 2018-04-01 DIAGNOSIS — I6340 Cerebral infarction due to embolism of unspecified cerebral artery: Secondary | ICD-10-CM | POA: Diagnosis not present

## 2018-04-01 DIAGNOSIS — G2581 Restless legs syndrome: Secondary | ICD-10-CM | POA: Diagnosis not present

## 2018-04-01 DIAGNOSIS — K219 Gastro-esophageal reflux disease without esophagitis: Secondary | ICD-10-CM | POA: Diagnosis not present

## 2018-04-01 DIAGNOSIS — I639 Cerebral infarction, unspecified: Secondary | ICD-10-CM | POA: Diagnosis not present

## 2018-04-01 DIAGNOSIS — R29898 Other symptoms and signs involving the musculoskeletal system: Secondary | ICD-10-CM | POA: Diagnosis not present

## 2018-04-01 DIAGNOSIS — G459 Transient cerebral ischemic attack, unspecified: Secondary | ICD-10-CM | POA: Diagnosis not present

## 2018-04-01 DIAGNOSIS — D649 Anemia, unspecified: Secondary | ICD-10-CM | POA: Diagnosis not present

## 2018-04-01 DIAGNOSIS — I69354 Hemiplegia and hemiparesis following cerebral infarction affecting left non-dominant side: Secondary | ICD-10-CM | POA: Diagnosis not present

## 2018-04-01 DIAGNOSIS — E785 Hyperlipidemia, unspecified: Secondary | ICD-10-CM | POA: Diagnosis not present

## 2018-04-01 DIAGNOSIS — G8194 Hemiplegia, unspecified affecting left nondominant side: Secondary | ICD-10-CM | POA: Diagnosis not present

## 2018-04-01 MED ORDER — CAMPHOR-MENTHOL 0.5-0.5 % EX LOTN: TOPICAL_LOTION | CUTANEOUS | 0 refills | Status: DC | PRN

## 2018-04-01 MED ORDER — LOSARTAN POTASSIUM 25 MG PO TABS: 25.0000 mg | ORAL_TABLET | Freq: Every day | ORAL | 2 refills | Status: DC

## 2018-04-01 MED ORDER — BACLOFEN 10 MG PO TABS: 10.0000 mg | ORAL_TABLET | Freq: Every day | ORAL | 0 refills | Status: DC

## 2018-04-01 MED ORDER — ACETAMINOPHEN 325 MG PO TABS: 650.0000 mg | ORAL_TABLET | ORAL | Status: DC | PRN

## 2018-04-01 MED ORDER — DULOXETINE HCL 60 MG PO CPEP: 60.0000 mg | ORAL_CAPSULE | Freq: Every day | ORAL | 3 refills | Status: DC

## 2018-04-01 MED ORDER — DOXYCYCLINE HYCLATE 100 MG PO TABS: 100.0000 mg | ORAL_TABLET | Freq: Two times a day (BID) | ORAL | 0 refills | Status: DC

## 2018-04-01 MED ORDER — CLOPIDOGREL BISULFATE 75 MG PO TABS: 75.0000 mg | ORAL_TABLET | Freq: Every day | ORAL | 3 refills | Status: DC

## 2018-04-01 MED ORDER — MEMANTINE HCL 10 MG PO TABS: 10.0000 mg | ORAL_TABLET | Freq: Two times a day (BID) | ORAL | 0 refills | Status: DC

## 2018-04-01 MED ORDER — DOXYCYCLINE HYCLATE 100 MG PO TABS: 100.0000 mg | ORAL_TABLET | Freq: Two times a day (BID) | ORAL | 0 refills | Status: AC

## 2018-04-01 MED ORDER — PREGABALIN 150 MG PO CAPS: 150.0000 mg | ORAL_CAPSULE | Freq: Two times a day (BID) | ORAL | 0 refills | Status: DC

## 2018-04-01 MED ORDER — PANTOPRAZOLE SODIUM 40 MG PO TBEC: 40.0000 mg | DELAYED_RELEASE_TABLET | Freq: Every day | ORAL | 3 refills | Status: DC

## 2018-04-01 MED ORDER — HYDROXYZINE HCL 25 MG PO TABS: 25.0000 mg | ORAL_TABLET | Freq: Three times a day (TID) | ORAL | 0 refills | Status: DC | PRN

## 2018-04-01 MED ORDER — MOMETASONE FURO-FORMOTEROL FUM 200-5 MCG/ACT IN AERO: 2.0000 | INHALATION_SPRAY | Freq: Two times a day (BID) | RESPIRATORY_TRACT | Status: DC

## 2018-04-01 MED ORDER — WIXELA INHUB 250-50 MCG/DOSE IN AEPB: 1.0000 | INHALATION_SPRAY | Freq: Every day | RESPIRATORY_TRACT | 0 refills | Status: DC

## 2018-04-01 MED ORDER — NICOTINE 21 MG/24HR TD PT24: 21.0000 mg | MEDICATED_PATCH | Freq: Every day | TRANSDERMAL | 0 refills | Status: DC

## 2018-04-01 MED ORDER — DIPHENHYDRAMINE-APAP (SLEEP) 25-500 MG PO TABS: 1.0000 | ORAL_TABLET | Freq: Every evening | ORAL | 0 refills | Status: DC | PRN

## 2018-04-01 MED ORDER — ALLOPURINOL 300 MG PO TABS: 300.0000 mg | ORAL_TABLET | Freq: Every day | ORAL | 3 refills | Status: DC

## 2018-04-01 MED ORDER — LEVETIRACETAM 500 MG PO TABS: 500.0000 mg | ORAL_TABLET | Freq: Two times a day (BID) | ORAL | 3 refills | Status: DC

## 2018-04-01 MED ORDER — ASPIRIN 325 MG PO TABS: 325.0000 mg | ORAL_TABLET | Freq: Every day | ORAL | Status: DC

## 2018-04-01 MED ORDER — FOLIC ACID 1 MG PO TABS: 1.0000 mg | ORAL_TABLET | Freq: Every day | ORAL | Status: DC

## 2018-04-01 MED ORDER — ATORVASTATIN CALCIUM 80 MG PO TABS: 80.0000 mg | ORAL_TABLET | Freq: Every day | ORAL | 3 refills | Status: DC

## 2018-04-01 NOTE — Progress Notes (Signed)
Pt being discharged to [facility] from hospital per orders from MD. Pt aware of transfer. Pt verbalized understanding of transfer. Pt's IV was removed prior to discharge. Pt exited hospital via stretcher accompanied by PTAR. RN called and gave report to nurse at [employer].

## 2018-04-01 NOTE — Clinical Social Work Placement (Signed)
Nurse to call report to [phone] and patient will be going to room 128B.   CLINICAL SOCIAL WORK PLACEMENT  NOTE  Date:  04/01/2018  Patient Details  Name: [name] MRN: [id number] Date of Birth: Jul 23, 1950  Clinical Social Work is seeking post-discharge placement for this patient at the [place] level of care (*CSW will initial, date and re-position this form in  chart as items are completed):      Patient/family provided with [place] Work Department's list of facilities offering this level of care within the geographic area requested by the patient (or if unable, by the patient's family).  Yes   Patient/family informed of their freedom to choose among providers that offer the needed level of care, that participate in Medicare, Medicaid or managed care program needed by the patient, have an available bed and are willing to accept the patient.      Patient/family informed of [facility]'s ownership interest in [facility] and [facility], as well as of the fact that they are under no obligation to receive care at these facilities.  PASRR submitted to EDS on       PASRR number received on 03/30/18     Existing PASRR number confirmed on 03/30/18     FL2 transmitted to all facilities in geographic area requested by pt/family on 03/30/18     FL2 transmitted to all facilities within larger geographic area on       Patient informed that his/her managed care company has contracts with or will negotiate with certain facilities, including the following:        Yes   Patient/family informed of bed offers received.  Patient chooses bed at [facility]     Physician recommends and patient chooses bed at      Patient to be transferred to [facility] on 03/30/18.  Patient to be transferred to facility by [facility]     Patient family notified on 03/30/18 of transfer.  Name of family member notified:  pt alert and oriented      PHYSICIAN       Additional Comment:    _______________________________________________ Gelene Mink, [place] 04/01/2018, 12:02 PM

## 2018-04-01 NOTE — Consult Note (Signed)
   [facility] CM Inpatient Consult   04/01/2018  [name] 18-Sep-1950 [id number]    Patient screened for extreme high risk score and hospitalizations to check if potential [facility] services are needed . Patient was hospitalized notes reveals this  68 y.o. male admitted 03/25/18 with L-side LE and face weakness; MRI showed acute ischemia in R ACA distribution with small foci in MCA. PMH includes prior stroke (residual LUE weakness), COPD, seizure disorder, DVT, CHF, polysubstance abuse, dementia. Of note, recent admission 01/2018 with R MCA infarct with d/c to CIR. Patient lived at [facility].  Patient is now being recommended for SNF.  No current community follow up needs as his disposition is for a skilled facility.  If this disposition changes.Please place a [facility] Care Management consult or for questions contact:   Natividad Brood, RN BSN [facility] Liaison  [phone] business mobile phone Toll free office [phone]

## 2018-04-01 NOTE — Discharge Summary (Signed)
[facility] Discharge Summary  Patient name: [name] Medical record number: [id number] Date of birth: 12-01-1950 Age: 68 y.o. Gender: male Date of Admission: 03/25/2018  Date of Discharge: 04/01/2018 Admitting Physician: Leeanne Rio, MD  Primary Care Provider: Charlott Rakes, MD Consultants: Neurology  Indication for Hospitalization: Acute ischemic stroke  Discharge Diagnoses/Problem List:  Acute ischemic stroke of right anterior hemisphere History of CVA Hypertension COPD History of seizure disorder DVT CHF Polysubstance use disorder - alcohol, tobacco, cocaine Dementia, vascular GERD Gout  Disposition: Discharge to SNF, [facility]  Discharge Condition: Improved, stable  Discharge Exam:  Physical Exam Constitutional:      Appearance: He is obese.  Cardiovascular:     Rate and Rhythm: Normal rate and regular rhythm.     Heart sounds: Normal heart sounds.  Pulmonary:     Effort: Pulmonary effort is normal.     Comments: Coarse breath sounds throughout Abdominal:     General: Bowel sounds are normal.  Musculoskeletal:     Right lower leg: No edema.     Left lower leg: Edema (1+ nonpitting) present.  Skin:    General: Skin is warm.     Findings: Erythema (Well demarcated, shrinking area of erythema to the right cubital fossa, indurated, improving.) present.  Neurological:     Motor: Weakness (Upper and lower left extremities, 4/5) present.     Coordination: Coordination normal.     Comments: Residual left-sided facial droop, slurred speech   Brief Hospital Course:  [name] is a 68 year old male admitted from home when he was noted to have worsening left-sided arm and leg weakness that started the night before admission.  The patient was noted to be smoking crack earlier on the day his leg started to go numb and he does not believe the 2 events are related.  Patient was also noted to have left facial  droop and slurred speech on admission.  Code stroke was called.  CT head in the ED was noted to have evidence of old infarction without hemorrhage and CTA showed no acute large vessel occlusion, so he was not an IR candidate.  Neurology recommended permissive hypertension through 04/01/2018 and dual antiplatelet therapy with Plavix and aspirin.  An echo was performed 2/16 and showed normal ejection fraction without wall motion abnormalities or cardiac source of embolism.    Due to complaint of left-sided abdominal pain, an ultrasound of the patient's abdomen was performed and revealed AAA.  It was recommended that patient pursue ultrasound follow-up for this in 3 years, February 2023.  Chest x-ray was performed and revealed 17 mm right lung nodule.  CT of the chest was performed and suggested the 2.0 cm pulmonary nodule of the right upper lobe was highly suspicious for a malignancy.  The patient was informed of this finding and concluded he wanted to address his issues with his stroke before pursuing treatment of the lung nodule.  The patient was initially set to be discharged on 03/30/2018, when a blanchable, erythematous, sharply demarcated area of induration was found in the patient's right cubital fossa.  The patient was started on clindamycin for coverage of cellulitis, before being switched to doxycycline for a total 7-day course.  Physical therapy worked with the patient recommended CIR.  The patient was declined from CIR; however, he was accepted at [facility] and sent to [facility] at discharge  Issues for Follow Up:  1. 68yr [place] for followup of AAA recommended (February 2023).  2. A lesion suspicious for malignancy was found in upper lobe of right lung. Patient is encouraged to follow-up with multidisciplinary thoracic oncology clinic for further work-up and management.  This clinic has already been contacted and given the patient's information. 3. Permissive hypertension is allowed through  April 01, 2018.  After which, patient should take his usual blood pressure medications. 4. Neurology recommends DAPT with aspirin 325 mg and Plavix 75 mg.  They did not state the duration of this treatment, but this usually lasts for 21 (April 15, 2018) to 90 days (Jun 17, 2018). 5. Avoid beta-blockers for treatment of hypertension due to crack cocaine use!  The patient's Coreg has been discontinued.   Significant Procedures: None  Significant Labs and Imaging:  Recent Labs  Lab 03/28/18 0616 03/29/18 0515 03/30/18 0417  WBC 5.2 5.2 5.7  HGB 10.2* 9.9* 9.9*  HCT 34.5* 34.0* 34.0*  PLT 170 159 187   Recent Labs  Lab 03/25/18 1448 03/25/18 1453 03/25/18 1924 03/26/18 0614 03/27/18 0811 03/28/18 0616  NA 144  --   --  142 142 143  K 4.2  --   --  3.9 4.2 4.1  CL 110  --   --  110 111 110  CO2 25  --   --  26 26 26   GLUCOSE 88  --   --  97 106* 111*  BUN 11  --   --  8 8 9   CREATININE 0.92 0.80 0.79 0.62 0.73 0.80  CALCIUM 9.7  --   --  9.0 9.3 9.2  ALKPHOS 66  --   --   --   --   --   AST 19  --   --   --   --   --   ALT 14  --   --   --   --   --   ALBUMIN 3.6  --   --   --   --   --    Results/Tests Pending at Time of Discharge: None  Discharge Medications:  Allergies as of 04/01/2018      Reactions   Darvocet [propoxyphene N-acetaminophen] Hives   Haldol [haloperidol Decanoate] Hives   Acetaminophen Nausea Only   Upset stomach, tolerates Hydrocodone/APAP if taken with food   Metformin And Related Nausea And Vomiting   Norco [hydrocodone-acetaminophen] Nausea And Vomiting   Tolerates if taken with food      Medication List    STOP taking these medications   carvedilol 3.125 MG tablet Commonly known as:  COREG     TAKE these medications   acetaminophen 325 MG tablet Commonly known as:  TYLENOL Take 2 tablets (650 mg total) by mouth every 4 (four) hours as needed for mild pain (or temp > 37.5 C (99.5 F)).   allopurinol 300 MG tablet Commonly known as:   ZYLOPRIM Take 1 tablet (300 mg total) by mouth daily.   aspirin 325 MG tablet Take 1 tablet (325 mg total) by mouth daily.   atorvastatin 80 MG tablet Commonly known as:  LIPITOR Take 1 tablet (80 mg total) by mouth daily at 6 PM.   baclofen 10 MG tablet Commonly known as:  LIORESAL Take 1 tablet (10 mg total) by mouth at bedtime.   camphor-menthol lotion Commonly known as:  SARNA Apply topically as needed for itching.   clopidogrel 75 MG tablet Commonly known as:  PLAVIX Take 1 tablet (75 mg total) by mouth daily.   diphenhydramine-acetaminophen 25-500 MG  Tabs tablet Commonly known as:  TYLENOL PM Take 1 tablet by mouth at bedtime as needed.   doxycycline 100 MG tablet Commonly known as:  VIBRA-TABS Take 1 tablet (100 mg total) by mouth every 12 (twelve) hours for 6 days.   DULoxetine 60 MG capsule Commonly known as:  CYMBALTA Take 1 capsule (60 mg total) by mouth daily.   folic acid 1 MG tablet Commonly known as:  FOLVITE Take 1 tablet (1 mg total) by mouth daily.   hydrOXYzine 25 MG tablet Commonly known as:  ATARAX/VISTARIL Take 1 tablet (25 mg total) by mouth every 8 (eight) hours as needed for itching.   levETIRAcetam 500 MG tablet Commonly known as:  KEPPRA Take 1 tablet (500 mg total) by mouth 2 (two) times daily.   losartan 25 MG tablet Commonly known as:  COZAAR Take 1 tablet (25 mg total) by mouth daily. Start taking on:  April 02, 2018   memantine 10 MG tablet Commonly known as:  NAMENDA Take 1 tablet (10 mg total) by mouth 2 (two) times daily.   mometasone-formoterol 200-5 MCG/ACT Aero Commonly known as:  DULERA Inhale 2 puffs into the lungs 2 (two) times daily.   nicotine 21 mg/24hr patch Commonly known as:  NICODERM CQ - dosed in mg/24 hours Place 1 patch (21 mg total) onto the skin daily.   pantoprazole 40 MG tablet Commonly known as:  PROTONIX Take 1 tablet (40 mg total) by mouth daily.   pregabalin 150 MG capsule Commonly known  as:  LYRICA Take 1 capsule (150 mg total) by mouth 2 (two) times daily.   WIXELA INHUB 250-50 MCG/DOSE Aepb Generic drug:  Fluticasone-Salmeterol Inhale 1-2 puffs into the lungs daily.      Discharge Instructions: Please refer to Patient Instructions section of EMR for full details.  Patient was counseled important signs and symptoms that should prompt return to medical care, changes in medications, dietary instructions, activity restrictions, and follow up appointments.   Follow-Up Appointments: Contact information for after-discharge care    Destination    [facility] Preferred SNF .   Service:  Skilled Nursing Contact information: [address] [place], [place], DO 04/01/2018, 11:48 AM PGY-1, [facility]

## 2018-04-08 ENCOUNTER — Telehealth: Payer: Self-pay | Admitting: *Deleted

## 2018-04-08 NOTE — Telephone Encounter (Signed)
Oncology Nurse Navigator Documentation  Oncology Nurse Navigator Flowsheets 04/08/2018  Navigator Location [facility]  Navigator Encounter Type Telephone/I received referral on [name].  I called to schedule but was unable to reach him.  I did leave a vm message for him to call with my name and phone number.   Telephone Outgoing Call  Treatment Phase Abnormal Scans  Barriers/Navigation Needs Education  Education Other  Interventions Education  Education Method Verbal  Acuity Level 1  Time Spent with Patient 15

## 2018-04-11 DIAGNOSIS — R222 Localized swelling, mass and lump, trunk: Secondary | ICD-10-CM | POA: Diagnosis not present

## 2018-04-11 DIAGNOSIS — R52 Pain, unspecified: Secondary | ICD-10-CM | POA: Diagnosis not present

## 2018-04-12 DIAGNOSIS — I10 Essential (primary) hypertension: Secondary | ICD-10-CM | POA: Diagnosis not present

## 2018-04-12 DIAGNOSIS — I2510 Atherosclerotic heart disease of native coronary artery without angina pectoris: Secondary | ICD-10-CM | POA: Diagnosis not present

## 2018-04-12 DIAGNOSIS — I6340 Cerebral infarction due to embolism of unspecified cerebral artery: Secondary | ICD-10-CM | POA: Diagnosis not present

## 2018-04-12 DIAGNOSIS — J449 Chronic obstructive pulmonary disease, unspecified: Secondary | ICD-10-CM | POA: Diagnosis not present

## 2018-04-13 ENCOUNTER — Telehealth: Payer: Self-pay | Admitting: *Deleted

## 2018-04-13 ENCOUNTER — Encounter: Payer: Self-pay | Admitting: *Deleted

## 2018-04-13 DIAGNOSIS — R911 Solitary pulmonary nodule: Secondary | ICD-10-CM

## 2018-04-13 NOTE — Telephone Encounter (Signed)
Oncology Nurse Navigator Documentation  Oncology Nurse Navigator Flowsheets 04/13/2018  Navigator Location [facility]  Navigator Encounter Type Telephone/I called [name] today to re-schedule his appt with Dr. Julien Nordmann. I was unable to reach but did leave a vm message with my name and phone number to call.   Telephone Outgoing Call  Treatment Phase Abnormal Scans  Barriers/Navigation Needs Education  Education Other  Interventions Education  Education Method Verbal  Acuity Level 1  Time Spent with Patient 15

## 2018-04-13 NOTE — Progress Notes (Signed)
Oncology Nurse Navigator Documentation  Oncology Nurse Navigator Flowsheets 04/13/2018  Navigator Location [facility]  Navigator Encounter Type Telephone/I called [facility] to help schedule an appt for patient to be seen with Dr. Julien Nordmann. I spoke with their transportation.  They will get him to appt on 04/25/2018  Telephone Outgoing Call  Treatment Phase Abnormal Scans  Barriers/Navigation Needs Education;Coordination of Care  Education Other  Interventions Coordination of Care;Education  Coordination of Care Other  Education Method Verbal  Acuity Level 1  Time Spent with Patient 18

## 2018-04-18 ENCOUNTER — Other Ambulatory Visit: Payer: Self-pay

## 2018-04-18 NOTE — Patient Outreach (Signed)
[facility]) Care Management  04/18/2018  THEOPHILE HARVIE Dec 03, 1950 [id number]   Medication Adherence call to Mr. Yamen Castrogiovanni left a message for patient to call back patient is due on Atorvastatin 80 mg. Mr. Starliper is showing past due under [place].   [facility] Management Direct Dial [phone]  Fax [phone] [email or web address]

## 2018-04-20 IMAGING — CT CT HEAD CODE STROKE
3 series · 14 of 47 positions shown, 16 images · non-contrast
Comparison: CT head without contrast 05/29/2016.

CLINICAL DATA: Code stroke. Right-sided facial droop beginning 1
hour ago. Left-sided weakness beginning 6 days ago.

EXAM:
CT HEAD WITHOUT CONTRAST
TECHNIQUE: Contiguous axial images were obtained from the base of the skull
through the vertex without intravenous contrast.

[Series 3: head 5.0 st · axial · 0.42mm/px · z∈[-148,-18]mm · 8 of 32 slices shown, 10 images]
[im 3/32  brain]
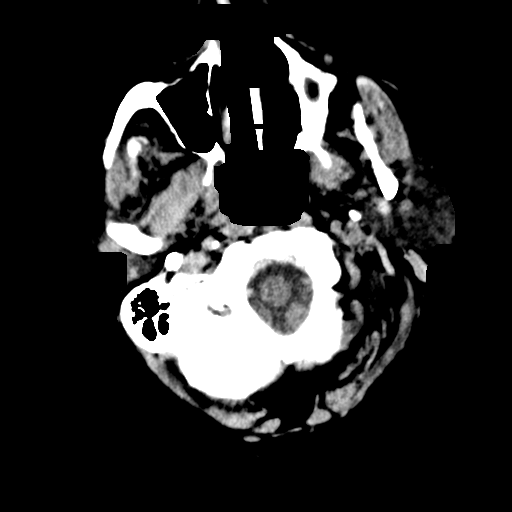
[im 3/32  bone]
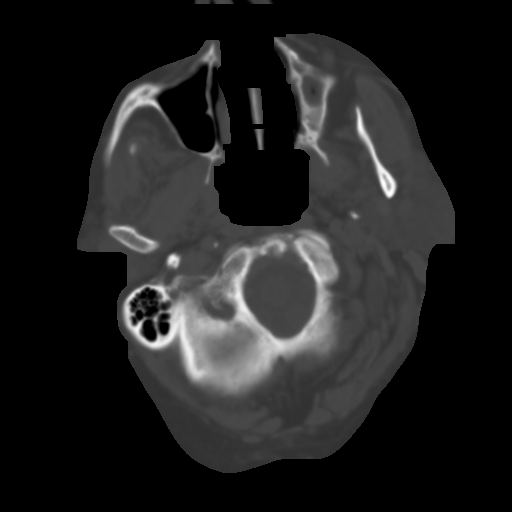
[im 7/32  brain]
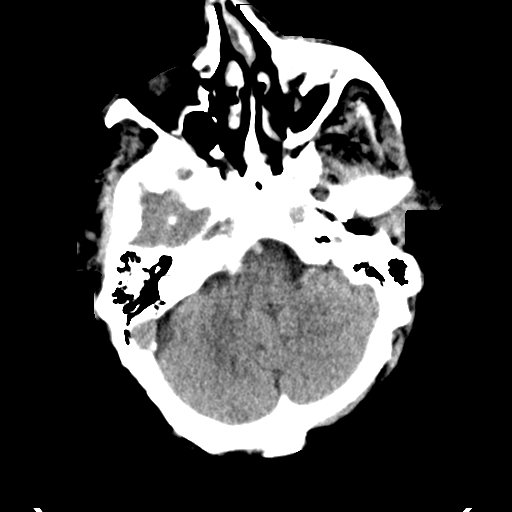
[im 10/32  brain]
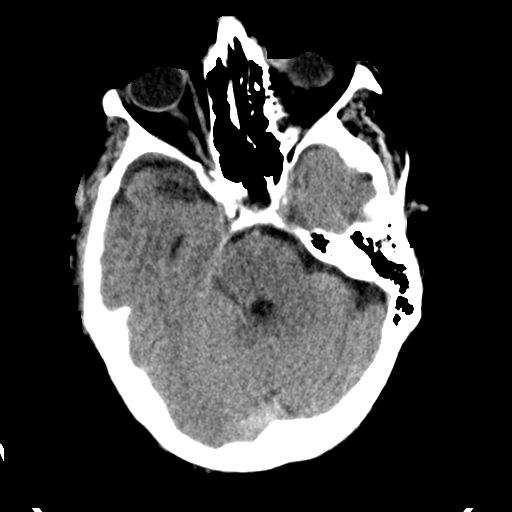
[im 14/32  brain]
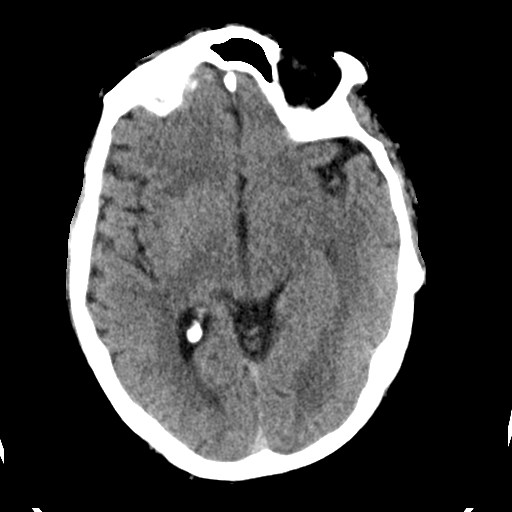
[im 18/32  brain]
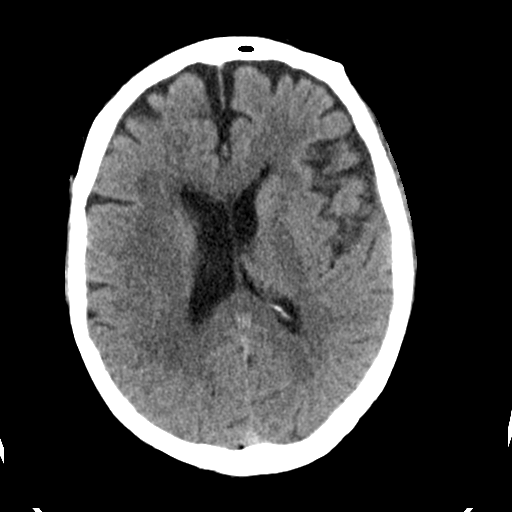
[im 18/32  bone]
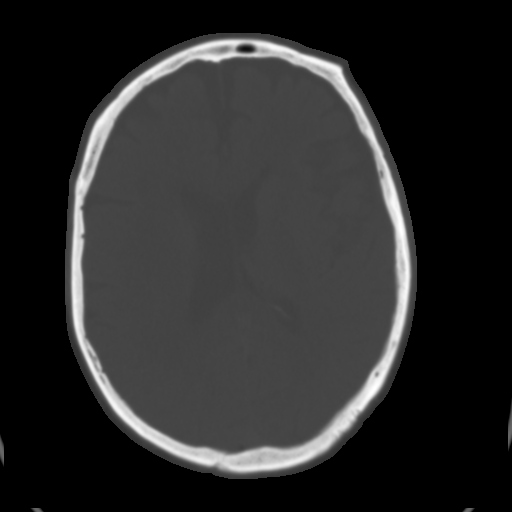
[im 22/32  brain]
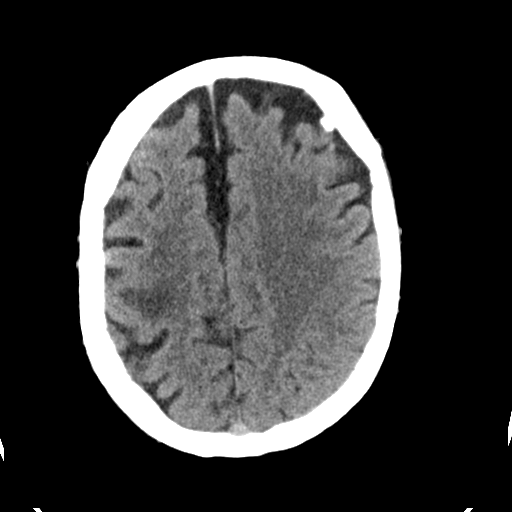
[im 25/32  brain]
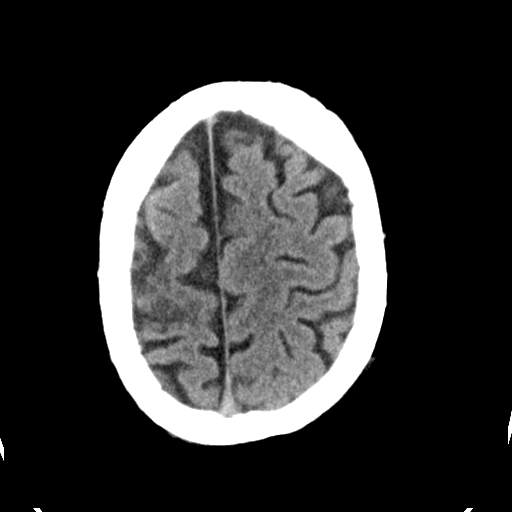
[im 29/32  brain]
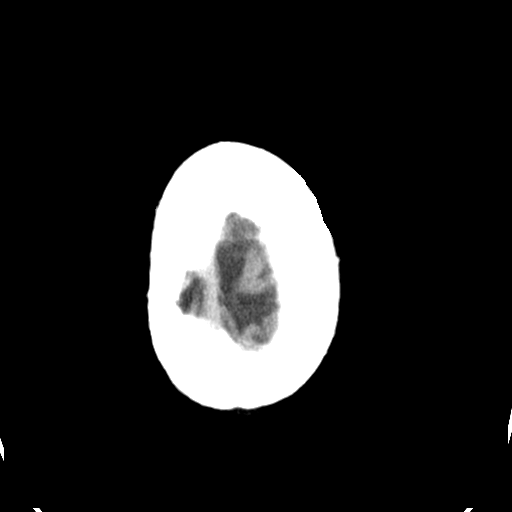

[Series 5: head 3.0 cor st · coronal · 0.31mm/px · 3 of 71 slices shown]
[im 24/71  brain]
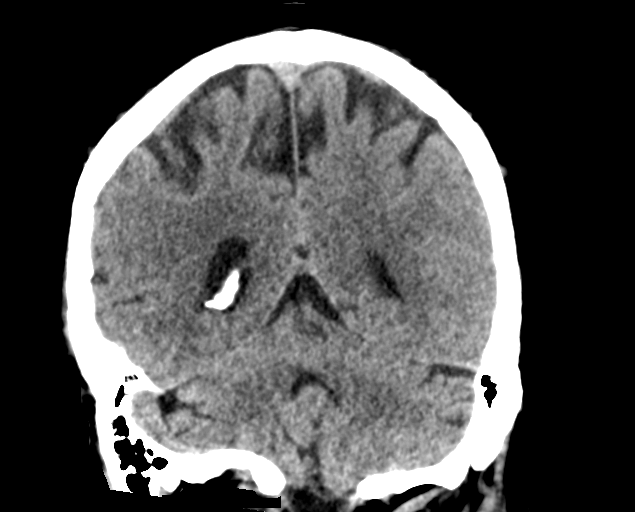
[im 32/71  brain]
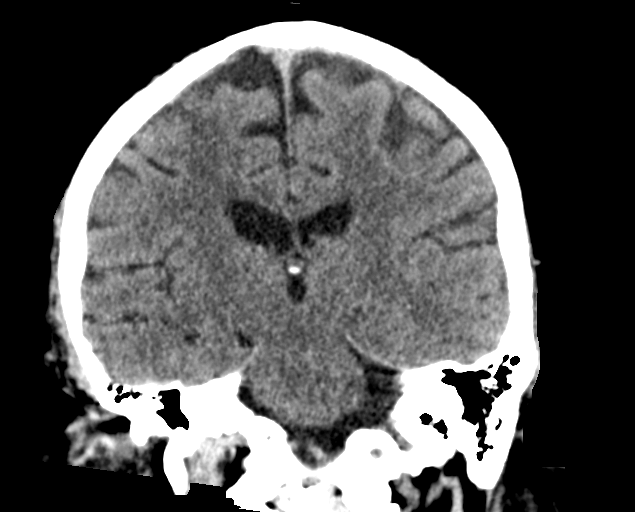
[im 39/71  brain]
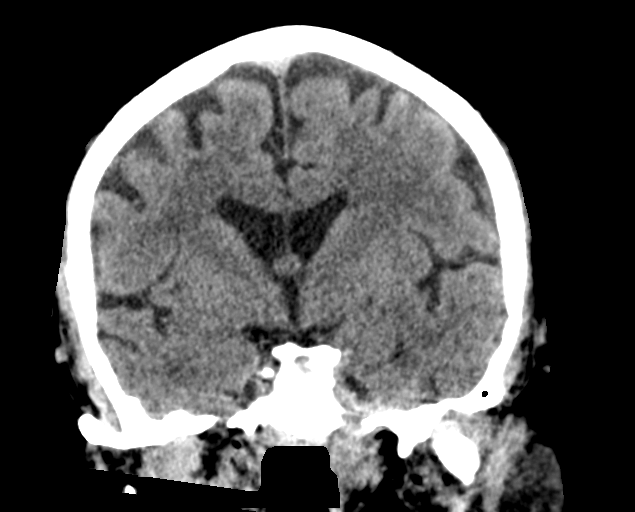

[Series 6: head 3.0 sag st · sagittal · 0.31mm/px · 3 of 67 slices shown]
[im 27/67  brain]
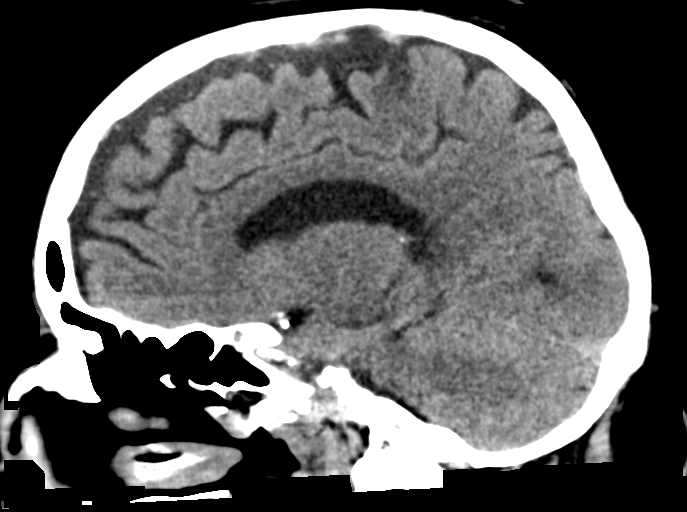
[im 34/67  brain]
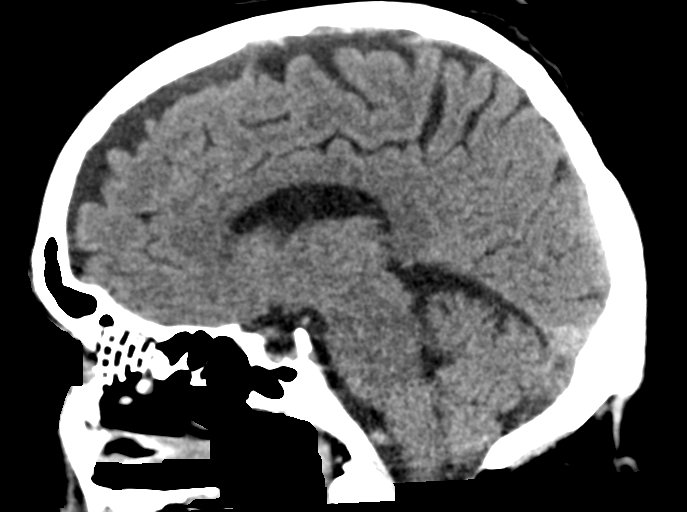
[im 41/67  brain]
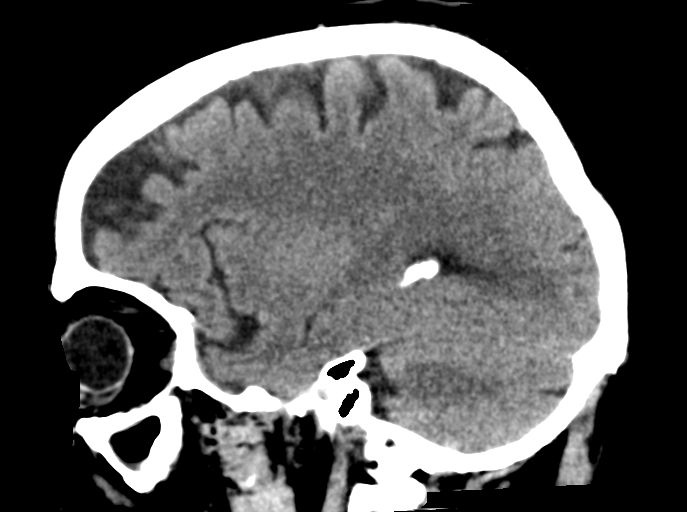

[14 of 47 positions shown; findings below may reference images not displayed]

FINDINGS: Brain: An acute/subacute posterior right frontal lobe nonhemorrhagic
infarct is more prominent than on the prior exam. No acute or
subacute left-sided infarct is present. The basal ganglia are intact
bilaterally. The insular ribbon is normal. No acute hemorrhage or
mass lesion is present. The ventricles are of normal size. No
significant extra-axial fluid collection is present.

Vascular: A cavernous right internal carotid artery stent is in
place. There is no hyperdense vessel. Atherosclerotic changes are
noted bilaterally.

Skull: The calvarium is intact. No focal lytic or blastic lesions
are present.

Sinuses/Orbits: The left maxillary sinus roof and orbital floor is
status post ORIF. Left maxillary sinus is chronically shrunken. The
remaining paranasal sinuses and the mastoid air cells are clear
bilaterally. Rightward nasal septal deviation is present.

Bilateral lens replacements are present. The globes and orbits are
within normal limits otherwise.

ASPECTS (Alberta Stroke Program Early CT Score)

- Ganglionic level infarction (caudate, lentiform nuclei, internal
capsule, insula, M1-M3 cortex): [DATE]

- Supraganglionic infarction (M4-M6 cortex): [DATE]

Total score (0-10 with 10 being normal): [DATE]
IMPRESSION: 1. Posterior right frontal lobe acute/subacute infarct is more
prominent than on the study 2 months ago. This may reflect evolution
of the previous infarct or a a progressive new infarct.
2. No other acute infarct.
3. ASPECTS is [DATE]

## 2018-04-20 IMAGING — MR MR MRA HEAD W/O CM
11 of 13 series · 33 of 48 positions shown · non-contrast
Comparison: Prior CT earlier the same day as well as previous MRI
from 02/06/2015.

CLINICAL DATA: Initial evaluation for left hemi paresis, slurred
speech. History of prior proximal right ICA and supraclinoid
stenting.

EXAM:
MRI HEAD WITHOUT CONTRAST
MRA HEAD WITHOUT CONTRAST
TECHNIQUE: Multiplanar, multiecho pulse sequences of the brain and surrounding
structures were obtained without intravenous contrast. Angiographic
images of the head were obtained using MRA technique without
contrast.

[Series 3: FLAIR · sagittal · 5.0mm · 0.47mm/px · 2 of 23 slices shown (1 of 2)]
[im 1/23]
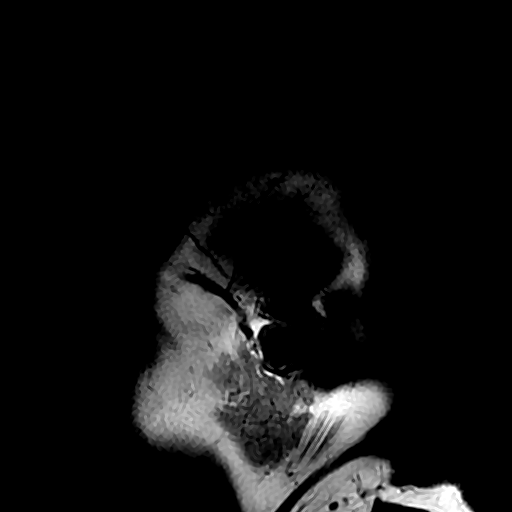
[im 23/23]
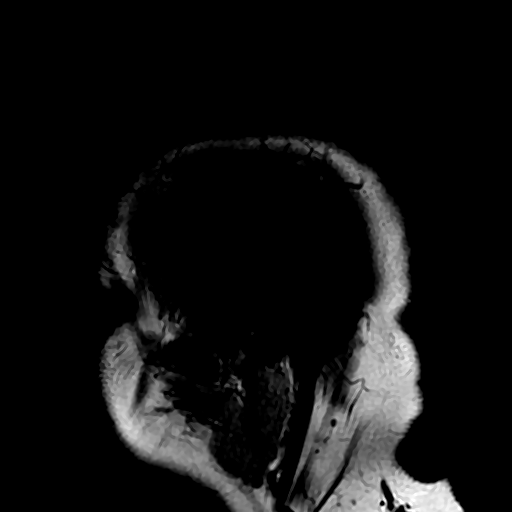

[Series 5: DWI · axial · 3.0mm · 0.94mm/px · z∈[-82,+71]mm · 6 of 104 slices shown (1 of 3)]
[im 1/104]
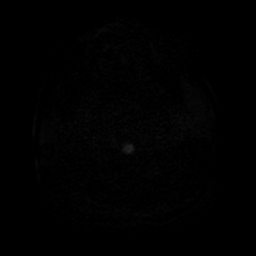
[im 21/104]
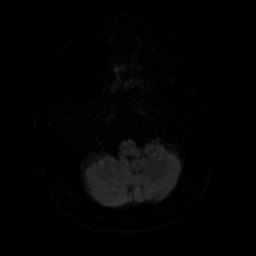
[im 42/104]
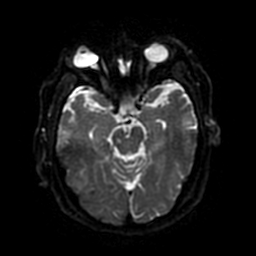
[im 62/104]
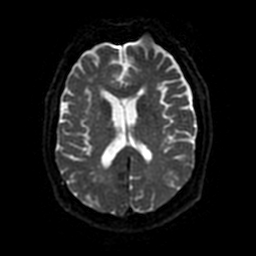
[im 83/104]
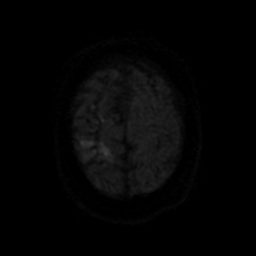
[im 104/104]
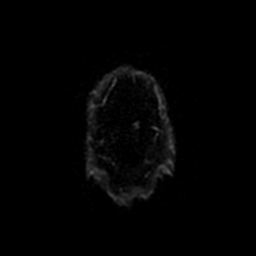

[Series 6: T2 · axial · 5.0mm · 0.47mm/px · z∈[-81,+69]mm · 2 of 26 slices shown (1 of 2)]
[im 1/26]
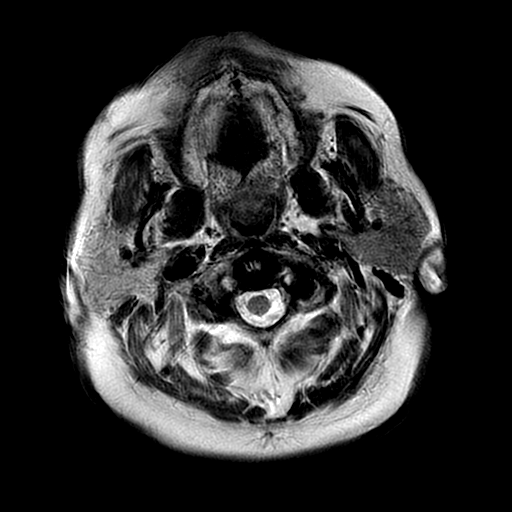
[im 26/26]
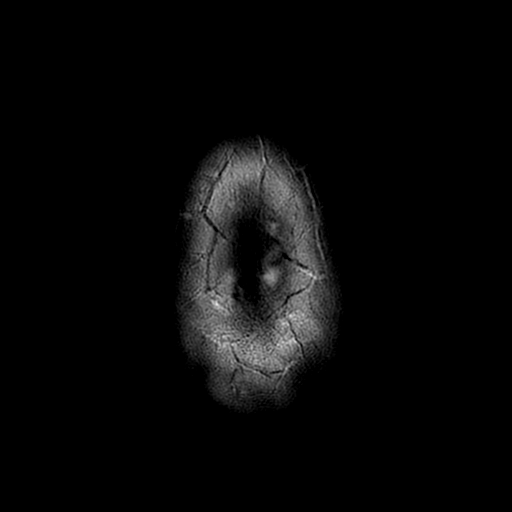

[Series 7: FLAIR · axial · 5.0mm · 0.47mm/px · z∈[-81,+69]mm · 2 of 26 slices shown (2 of 2)]
[im 1/26]
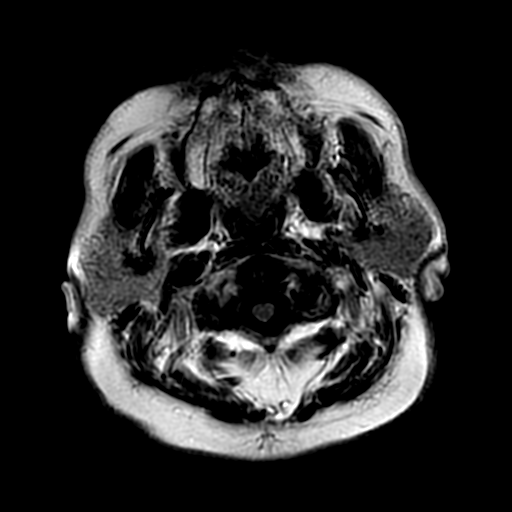
[im 26/26]
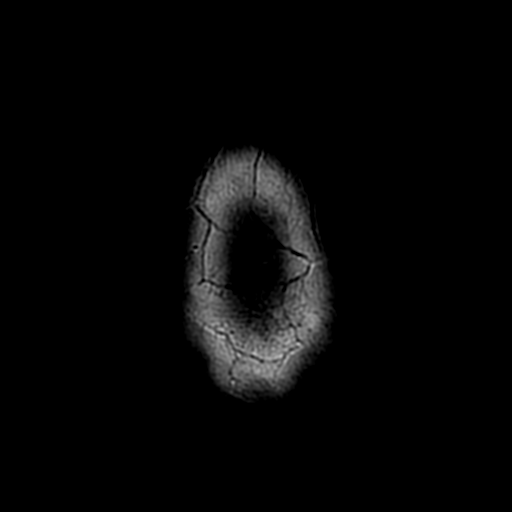

[Series 9: DWI · axial · 3.0mm · 0.94mm/px · z∈[-82,+71]mm · 6 of 104 slices shown (2 of 3)]
[im 1/104]
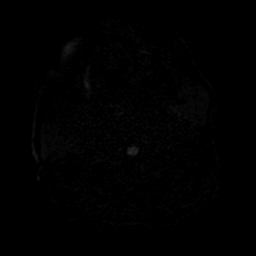
[im 21/104]
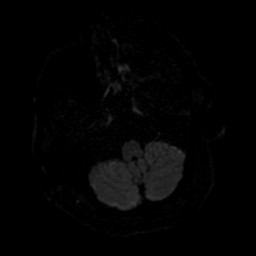
[im 42/104]
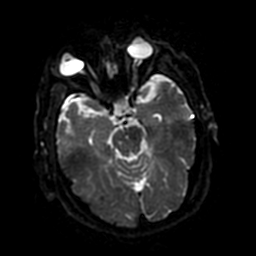
[im 62/104]
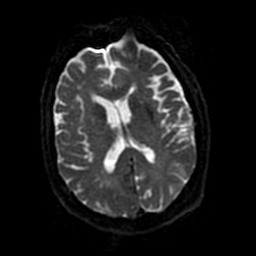
[im 83/104]
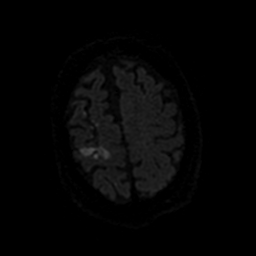
[im 104/104]
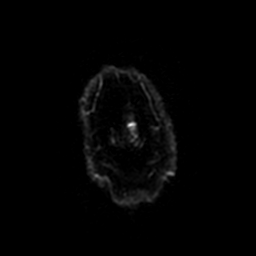

[Series 10: DWI · coronal · 4.0mm · 0.94mm/px · 4 of 72 slices shown (3 of 3)]
[im 1/72]
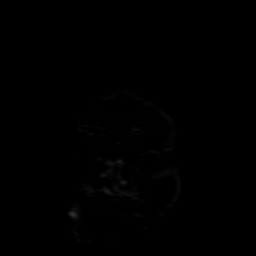
[im 24/72]
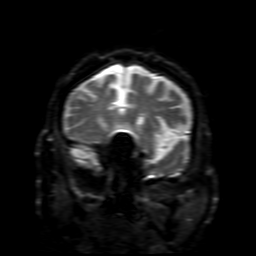
[im 48/72]
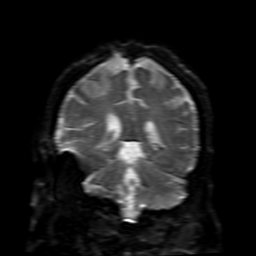
[im 72/72]
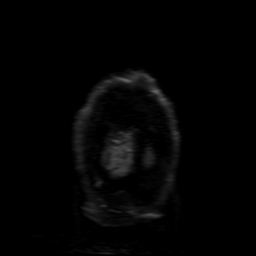

[Series 11: GRE · axial · 5.0mm · 0.43mm/px · 1 of 28 slices shown]
[im 1/28]
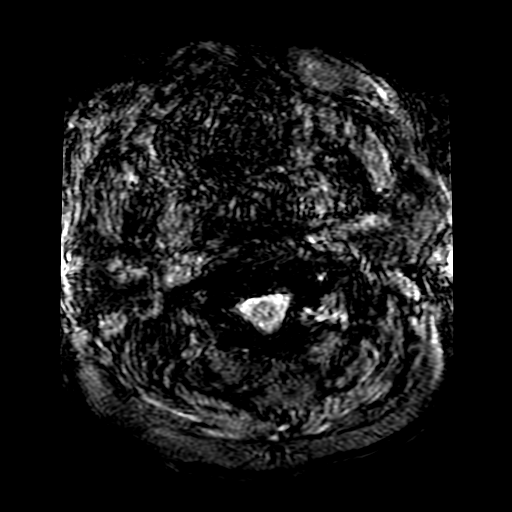

[Series 13: T2 · coronal · 5.0mm · 0.47mm/px · 2 of 30 slices shown (2 of 2)]
[im 1/30]
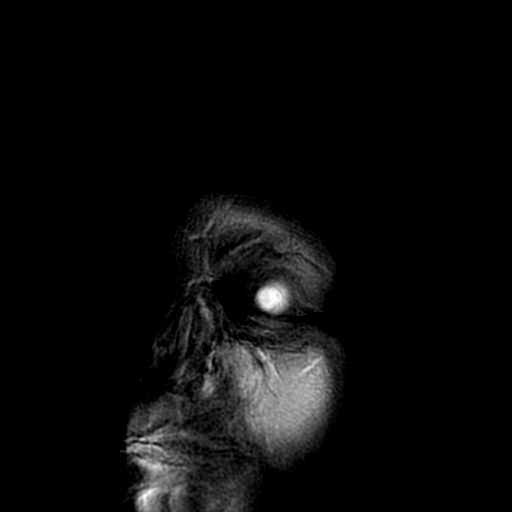
[im 30/30]
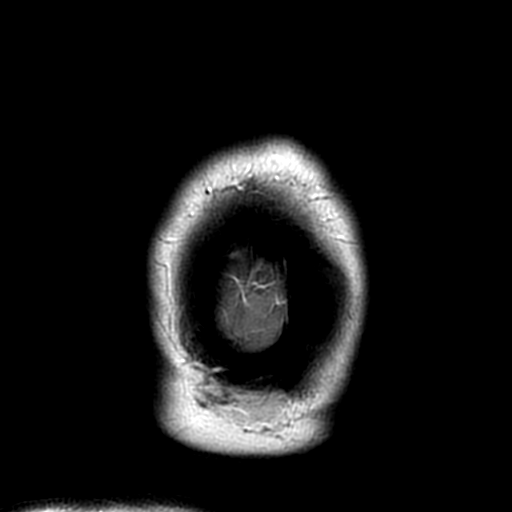

[Series 550: ADC · axial · 3.0mm · 0.94mm/px · z∈[-82,+71]mm · 3 of 52 slices shown (1 of 3)]
[im 1/52]
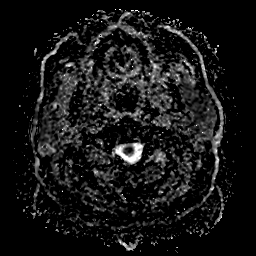
[im 26/52]
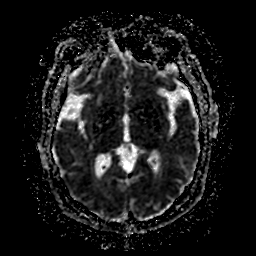
[im 52/52]
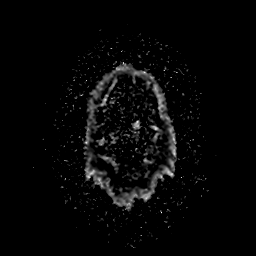

[Series 950: ADC · axial · 3.0mm · 0.94mm/px · z∈[-82,+71]mm · 3 of 52 slices shown (2 of 3)]
[im 1/52]
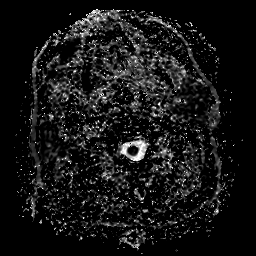
[im 26/52]
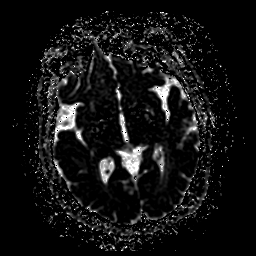
[im 52/52]
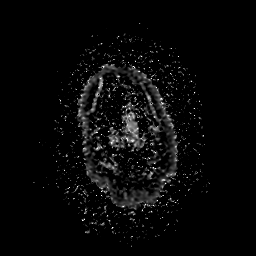

[Series 1050: ADC · coronal · 4.0mm · 0.94mm/px · 2 of 36 slices shown (3 of 3)]
[im 1/36]
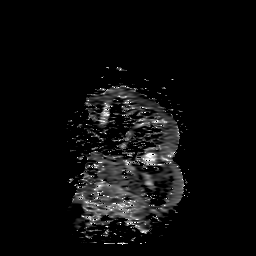
[im 36/36]
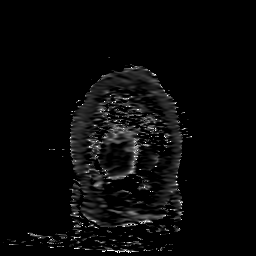

[33 of 48 positions shown; findings below may reference images not displayed]

FINDINGS: MRI HEAD FINDINGS

Brain: Study moderately degraded by motion artifact.

Generalized age related cerebral atrophy. Patchy and confluent
T2/FLAIR hyperintensity within the periventricular and deep white
matter both cerebral hemispheres most consistent with chronic small
vessel ischemic disease. Overall, changes are moderate in nature.

There is patchy predominantly cortical abnormal restricted diffusion
involving the right frontoparietal region, with involvement of the
pre and postcentral gyrus (series 9, image 46). Additional patchy
cortical involvement more anteriorly within the right frontal lobe
(series 9, image 43). Few small foci within the periventricular and
subcortical white matter of the right frontal and parietal lobes
(series 9, image 34). These are acute in nature with corresponding
signal loss on ADC map. Probable trace petechial hemorrhage within a
few areas of infarction at the posterior right frontal region
(series 11, image 24, 23). Few more vague foci of diffusion
abnormality without definite ADC correlate within the right frontal
lobe (series 9, image 37), right periatrial white matter (series 9,
image 25), and right parietal lobe (series 9, image 31) are
suspected to be more subacute in nature.

No other evidence for acute ischemia. Few scattered foci of chronic
microhemorrhage at the parasagittal right parietal lobe and right
occipital lobe noted, stable from prior.

No mass lesion, midline shift or mass effect. No hydrocephalus. No
extra-axial fluid collection. Major dural sinuses are grossly
patent.

Incidental note made of an empty sella.

Vascular: Chronic abnormal flow void at the right carotid siphon,
poorly visualized on this motion degraded exam. Major intracranial
vascular flow voids otherwise maintained.

Skull and upper cervical spine: Craniocervical junction normal.
Visualized upper cervical spine unremarkable. Bone marrow signal
intensity normal. No scalp soft tissue abnormality.

Sinuses/Orbits: Globes and orbital soft tissues grossly
unremarkable. Patient status post lens extraction bilaterally.

Scattered mucosal thickening noted within ethmoidal air cells and
left maxillary sinus. No air-fluid level to suggest active sinus
infection. No mastoid effusion.

MRA HEAD FINDINGS

ANTERIOR CIRCULATION:

Distal cervical segments of the internal carotid artery is are
patent with antegrade flow. Right ICA diminutive as compared to the
left. Petrous, cavernous, and supraclinoid left ICA patent without
flow-limiting stenosis. Petrous right ICA patent. There is loss of
signal flow void at the cavernous/ supraclinoid right ICA related to
the right ICA stent. Distal reconstitution at the right ICA terminus
which is patent. Left A1 segment dominant and widely patent. Right
A1 segment hypoplastic but patent as well. Anterior communicating
artery normal. Anterior cerebral arteries patent to their distal
aspects.

Left M1 segment, proximal M2 branches, and distal left MCA branches
are widely patent.

Patent flow through the right M1 segment without stenosis or
occlusion. Flow is mildly attenuated as compared to the left. No
proximal M2 occlusion on the right. Distal right MCA branches mildly
attenuated as compared to the left.

POSTERIOR CIRCULATION:

Vertebral arteries widely patent to the vertebrobasilar junction.
Posterior inferior cerebral arteries patent proximally. Basilar
artery widely patent. Superior cerebral arteries and posterior
cerebral arteries widely patent bilaterally without stenosis.

No aneurysm or vascular malformation.
IMPRESSION: MRI HEAD IMPRESSION:

1. Patchy multifocal acute ischemic right MCA territory infarcts,
predominantly cortical and posterior right frontal in location, with
involvement of the right pre and postcentral gyri. Associated mild
petechial hemorrhage without hemorrhagic transformation. Probable
few scattered superimposed more subacute infarcts as above.
2. Moderate chronic microvascular ischemic disease.

MRA HEAD IMPRESSION:

1. Absent flow void at the level of the right cavernous/supraclinoid
stent with distal reconstitution at the right ICA terminus. Patent
flow within the right MCA artery and its branches, although somewhat
attenuated as compared to the contralateral left. Vascular patency
through the stent could further evaluated with CTA as clinically
desired.
2. Otherwise negative intracranial MRA.

## 2018-04-20 IMAGING — CR DG CHEST 2V
3 series · 3 of 3 positions shown · non-contrast
Comparison: 05/29/2016 and prior radiographs

CLINICAL DATA: Chest pain.  Recent stroke.

EXAM:
CHEST  2 VIEW

[chest lat (1 of 2)]
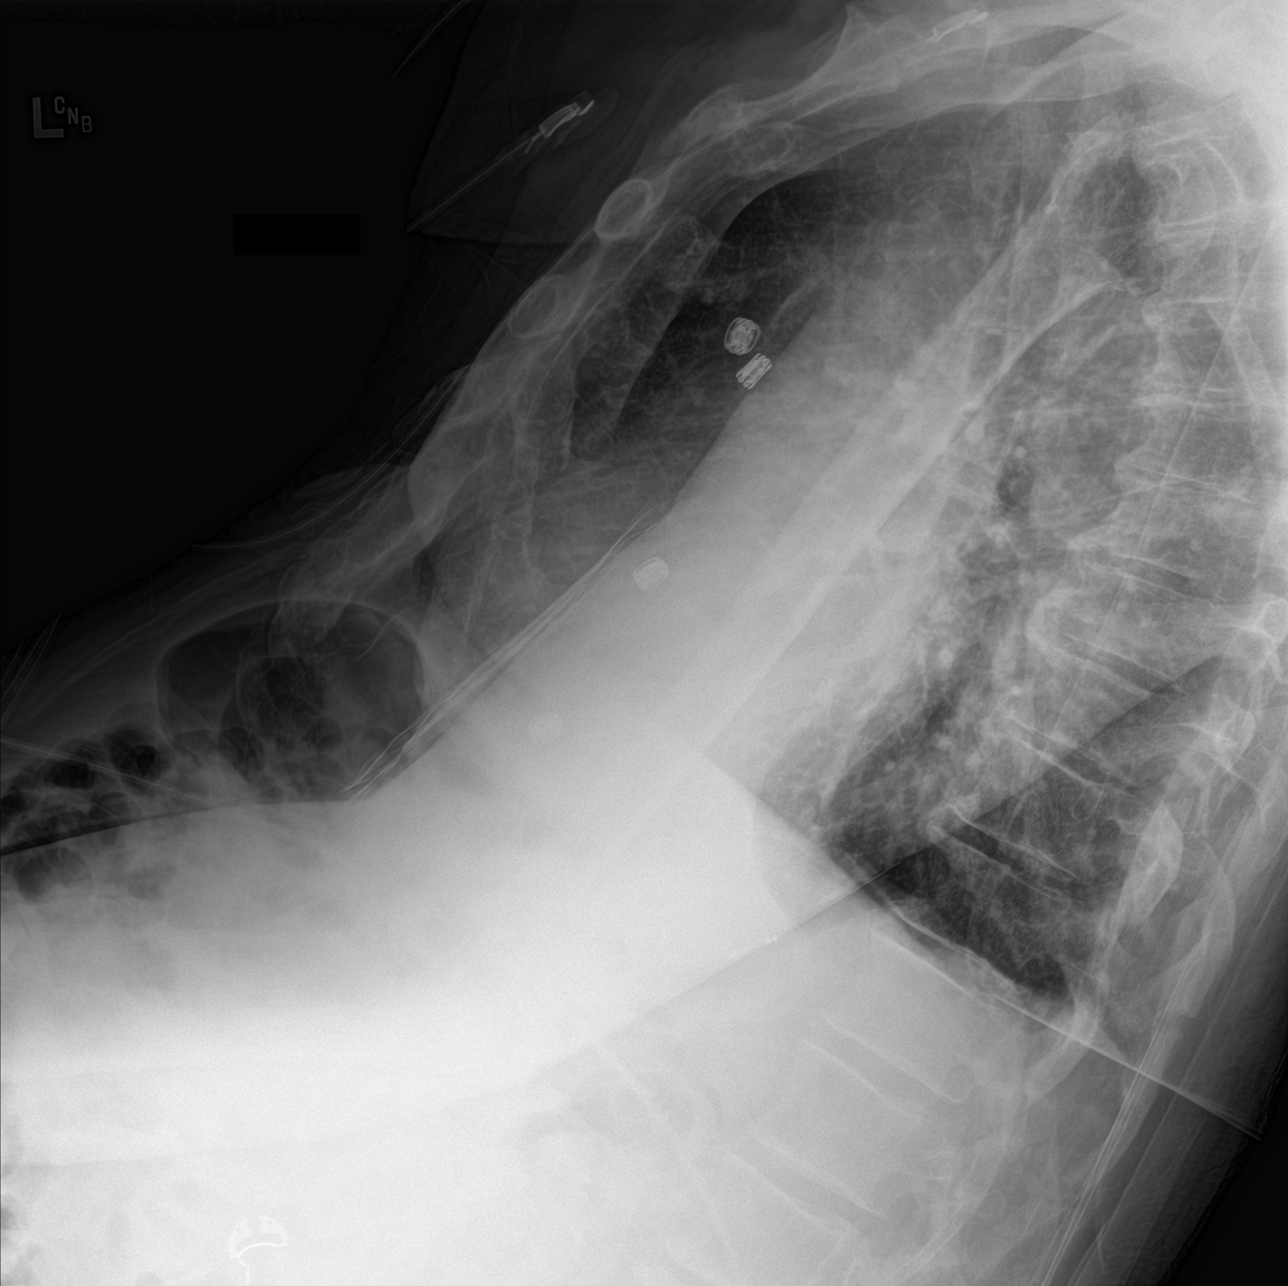

[chest ap]
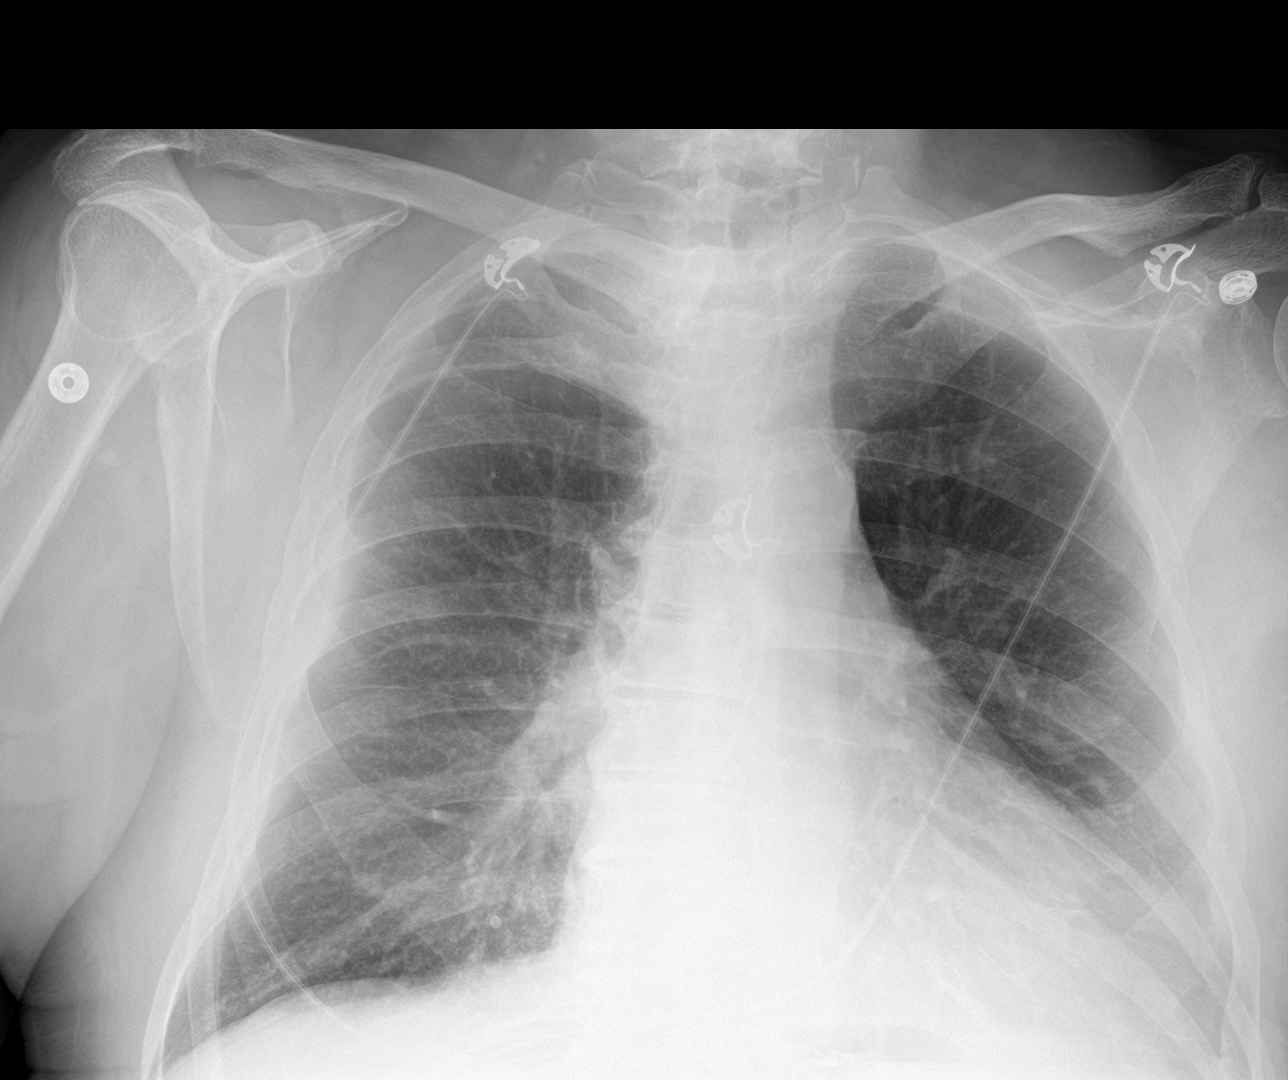

[chest lat (2 of 2)]
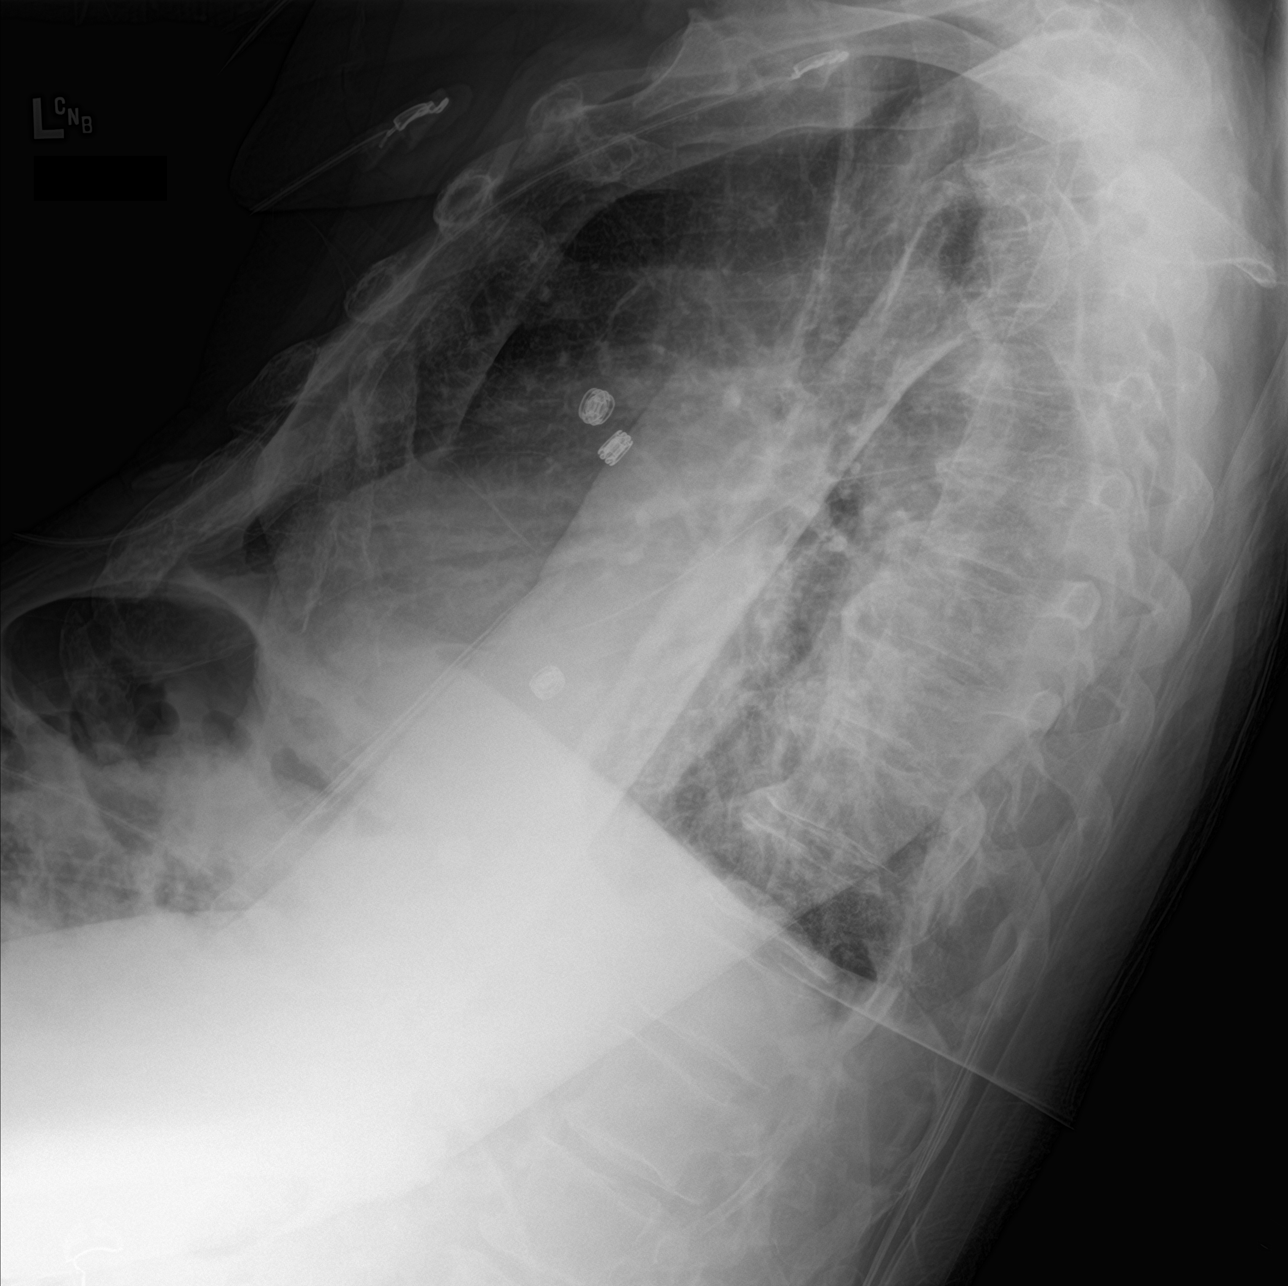

[3 of 3 positions shown; findings below may reference images not displayed]

FINDINGS: Cardiomegaly again noted.

There is no evidence of focal airspace disease, pulmonary edema,
suspicious pulmonary nodule/mass, pleural effusion, or pneumothorax.
No acute bony abnormalities are identified.
IMPRESSION: Cardiomegaly without evidence of acute cardiopulmonary disease.

## 2018-04-21 IMAGING — CT CT ANGIO HEAD
1 of 8 series · 6 of 47 positions shown · IV contrast (isovue)
Comparison: Brain MRI and intracranial MRA 07/17/2016

CLINICAL DATA: 66-year-old male with patchy right MCA ischemia
detected on MRI performed for code stroke presentation with left
side weakness. Right ICA siphon stent.

EXAM:
CT ANGIOGRAPHY HEAD AND NECK
TECHNIQUE: Multidetector CT imaging of the head and neck was performed using
the standard protocol during bolus administration of intravenous
contrast. Multiplanar CT image reconstructions and MIPs were
obtained to evaluate the vascular anatomy. Carotid stenosis
measurements (when applicable) are obtained utilizing NASCET
criteria, using the distal internal carotid diameter as the
denominator.
CONTRAST:  50 mL Isovue 370

[Series 5: cow 2.0 · axial · 0.48mm/px · z∈[-294,-28]mm · 6 of 187 slices shown]
[im 27/187  brain]
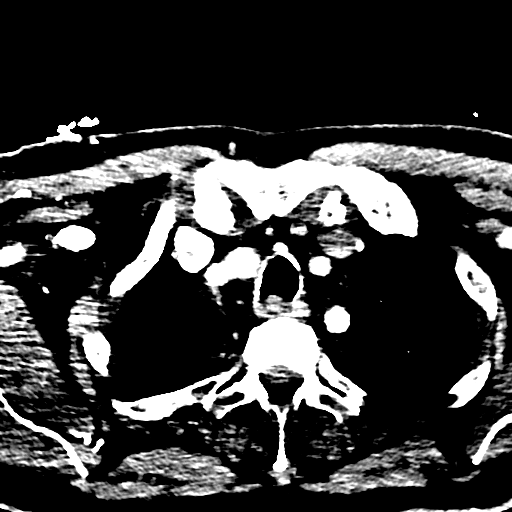
[im 54/187  bone]
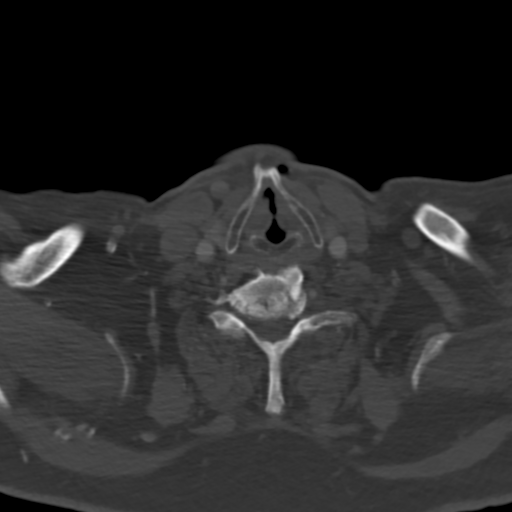
[im 80/187  brain]
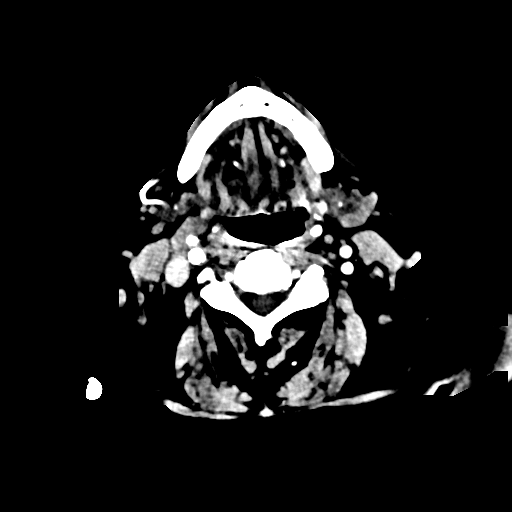
[im 107/187  bone]
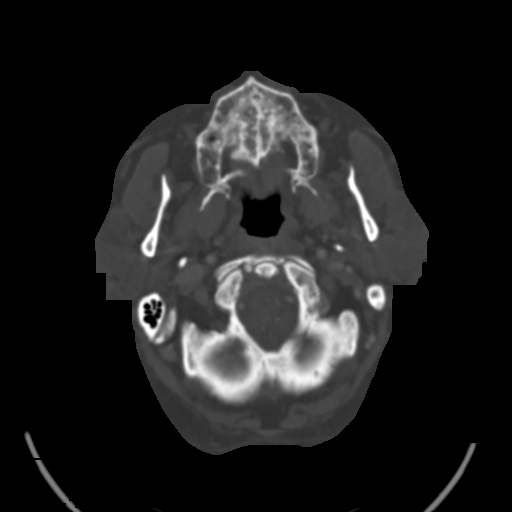
[im 133/187  brain]
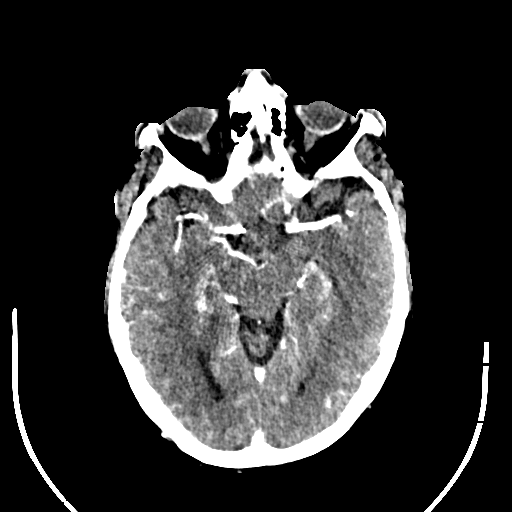
[im 160/187  bone]
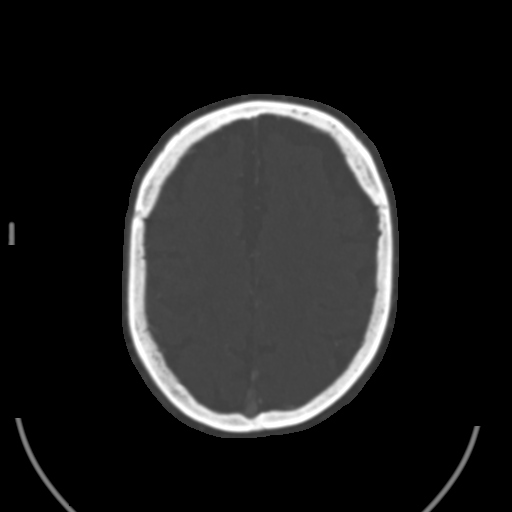

[6 of 47 positions shown; findings below may reference images not displayed]

FINDINGS: CTA NECK

Skeleton: Absent dentition. Previous left orbital floor and left
maxilla repair. Cervical spine degeneration with C2-C3 ankylosis. No
acute osseous abnormality identified.

Aside from the left maxillary, paranasal sinuses and mastoids are
stable and well pneumatized.

Upper chest: Mild scarring and dependent atelectasis in the visible
lung parenchyma. Small volume pericardial fluid in the superior
pericardial recess. No superior mediastinal lymphadenopathy.

Other neck: Incidental intravenous gas occasionally noted in the
neck, most prominently in an anterior thyroidal vein on the left.
Otherwise negative. No cervical lymphadenopathy.

Aortic arch: Extensive soft and calcified atherosclerosis in the
aortic arch. Areas of ulcerated plaque in the distal arch (series 8,
image 147).

Right carotid system: 40-50% stenosis at the proximal
brachiocephalic artery related to soft and calcified plaque (series
8, image 124). No significant right CCA origin stenosis despite soft
plaque. There is a distal right CCA through proximal right ICA
vascular stent in place which is patent, but is tapered distally
which appears to result in the degree of circumferential stenosis
(series 5, image 82 and series 8, image 123). No other in stent
stenosis is evident. Distal to this the cervical right ICA is patent
to the skullbase with only minimal calcified plaque.

Left carotid system: No left CCA origin stenosis despite soft and
calcified plaque. Intermittent soft plaque in the left CCA without
stenosis. At the left carotid bifurcation the left ECA origin is
occluded. There is soft and calcified plaque in the left ICA origin
and bulb resulting in a tapered appearance of the vessel, but no
focal hemodynamically significant stenosis. The left ICA remains
patent to the skullbase.

Vertebral arteries:

Soft and calcified plaque in the proximal right subclavian artery
does not appear hemodynamically significant. Calcified plaque at the
right vertebral artery origin and right V1 segment. The right
vertebral artery is diminutive, such that there could be up to
moderate stenosis. The left vertebral artery remains patent to the
neck with other intermittent right V2 and V3 segment calcified
plaque.

No significant proximal left subclavian artery stenosis despite
abundant calcified plaque. Calcified plaque at the left vertebral
artery origin with mild stenosis. Intermittent left vertebral artery
V2 and V3 segment calcified plaque without significant stenosis. The
left PICA origin is patent and arises just proximal to the dura at
the C1 level.

CTA HEAD

Posterior circulation: Patent in fairly codominant distal vertebral
arteries without additional vertebral artery stenosis. Patent
vertebrobasilar junction without stenosis. No basilar artery
stenosis. SCA and PCA origins are patent. Posterior communicating
arteries are diminutive or absent. There is mild bilateral P2
segment irregularity. PCA branches are otherwise within normal
limits.

Anterior circulation: The right ICA siphon stent from the cavernous
to the supraclinoid segment appears patent. No definite in stent
stenosis.

The left ICA siphon is patent with moderate calcified plaque in the
cavernous segment. No hemodynamically significant stenosis. Patent
carotid termini. Normal MCA origins. Dominant left A1 segment. The
right A1 is patent. Bilateral ACA branches are within normal limits.
Left MCA M1 segment, bifurcation, and left MCA branches are within
normal limits.

Right MCA M1 segment, and right MCA bifurcation are patent. No right
MCA branch occlusion is identified, and the right MCA branches
appear more symmetric to the left side on CTA today versus the
recent MRA.

Venous sinuses: Patent.

Anatomic variants: None.

Delayed phase: No abnormal enhancement identified. Stable
hypodensity at the superior and lateral right peri-Rolandic cortex
corresponding to the diffusion abnormality on MRI. No intracranial
hemorrhage or mass effect.

Review of the MIP images confirms the above findings
IMPRESSION: 1. Negative for emergent large vessel occlusion.
2. There are patent vascular stents in both the cervical right ICA
and the right ICA siphon. However, the right cervical carotid stent
is taper distally and this is suspected to be hemodynamically
significant. No definite intracranial in-stent stenosis identified.
3. Underlying widespread atherosclerosis of the aortic arch and head
and neck vessels.
4. Ulcerated soft plaque and calcified plaque in the distal arch.
Plaque in the proximal brachiocephalic artery which is approaching
hemodynamically significance.
5. No significant left cervical carotid or left ICA stenosis despite
plaque. Incidental left EXTERNAL carotid artery occlusion at its
origin.
6. Multifocal moderate right vertebral artery stenosis at its origin
and V1 segment. Mild left vertebral artery origin stenosis.
7. Stable CT appearance of the brain since yesterday.

## 2018-04-23 IMAGING — XA IR VERTEBRAL  NON-SELECT UNILAT  RIGHT(MS)
1 series · 11 of 24 positions shown · IV contrast (IODINE)
Comparison: CT angiogram of 02/07/2017, and CT angiogram of
07/18/2016.

CLINICAL DATA: Left-sided weakness.  Abnormal MRI MRA of the brain.

EXAM:
BILATERAL COMMON CAROTID AND INNOMINATE ANGIOGRAPHY; IR ANGIO
VERTEBRAL SEL VERTEBRAL UNI LEFT MOD SED; IR ANGIO VERTEBRAL SEL
SUBCLAVIAN INNOMINATE UNI RIGHT MOD SED
TECHNIQUE: Informed written consent was obtained from the patient after a
thorough discussion of the procedural risks, benefits and
alternatives. All questions were addressed. Maximal Sterile Barrier
Technique was utilized including caps, mask, sterile gowns, sterile
gloves, sterile drape, hand hygiene and skin antiseptic. A timeout
was performed prior to the initiation of the procedure.

[Series 300: dr. (person_name) · 11 of 226 slices shown]
[im 10/226]
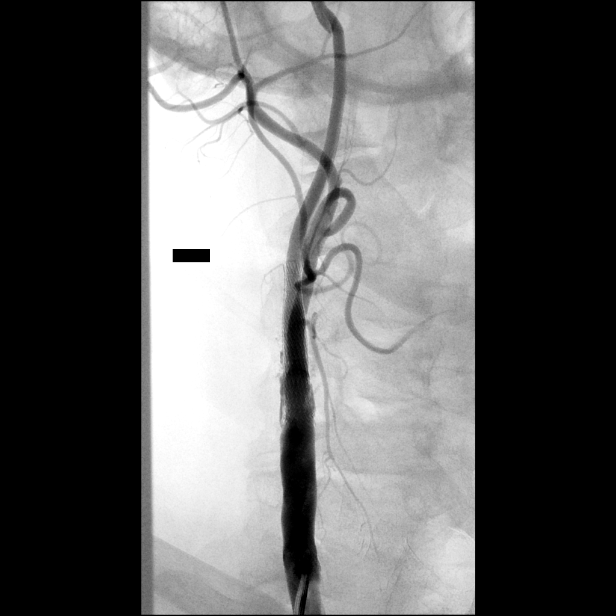
[im 30/226]
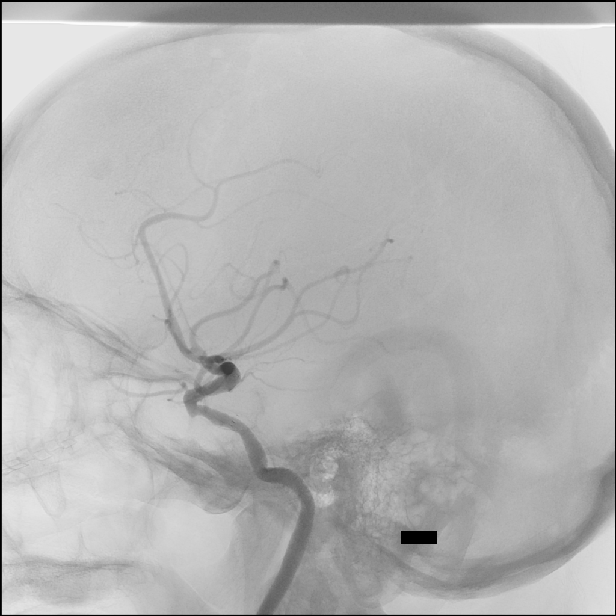
[im 49/226]
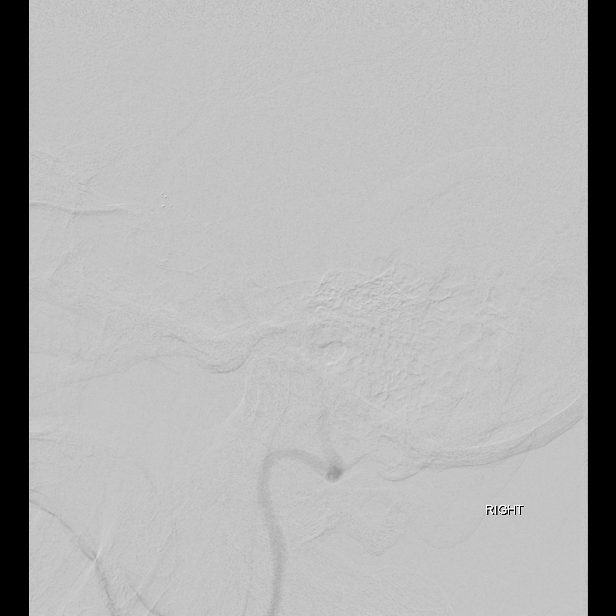
[im 69/226]
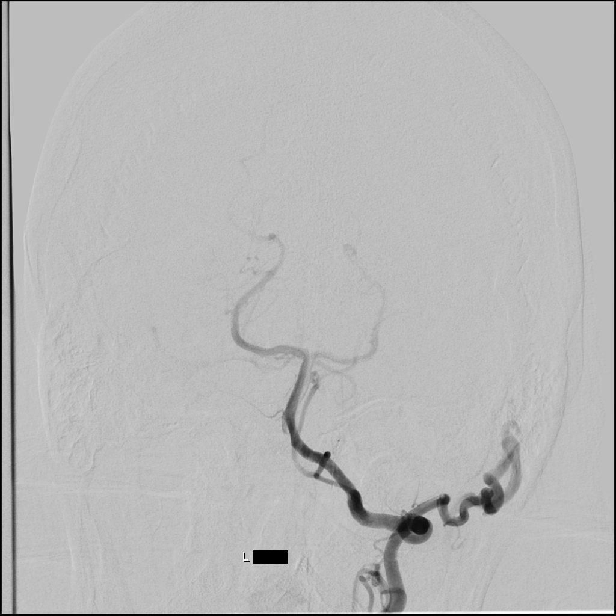
[im 89/226]
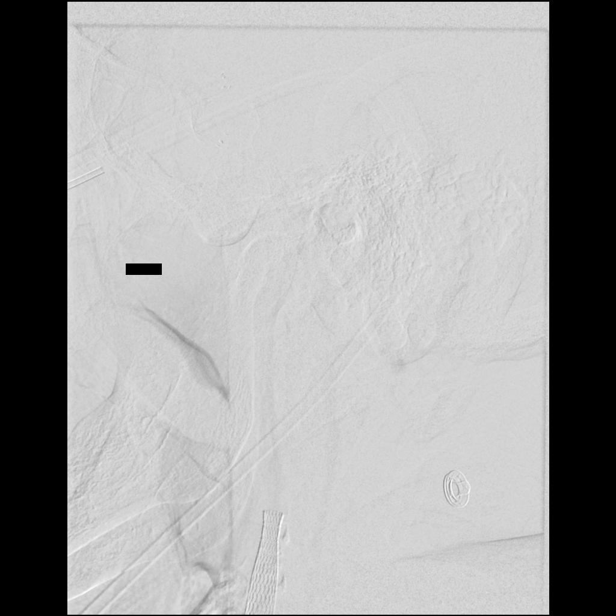
[im 118/226]
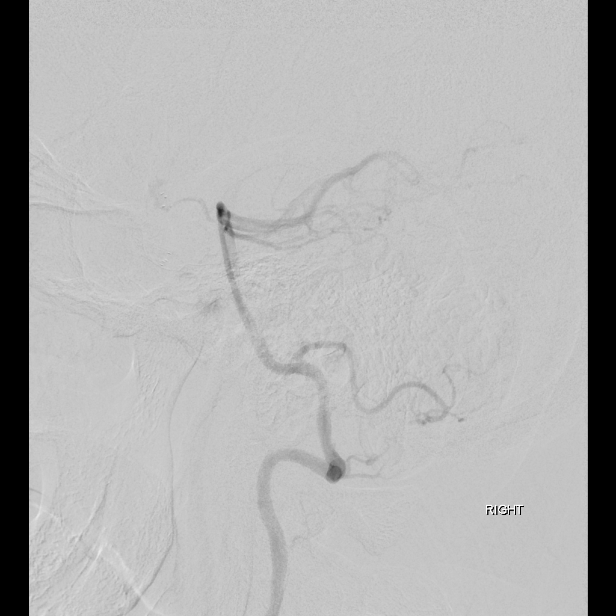
[im 137/226]
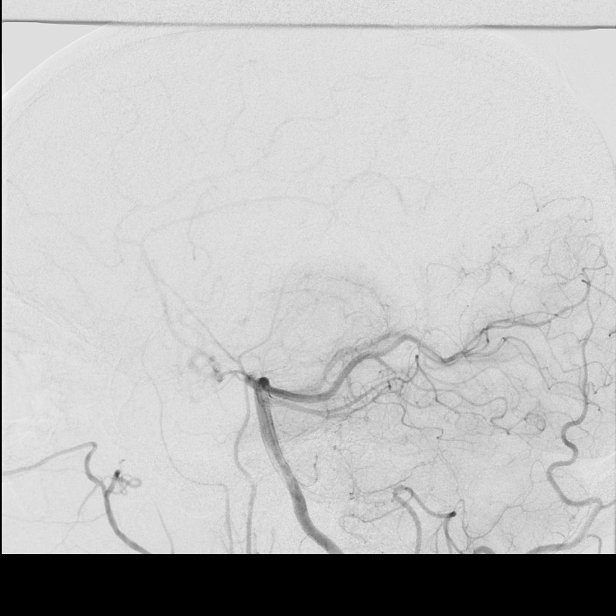
[im 157/226]
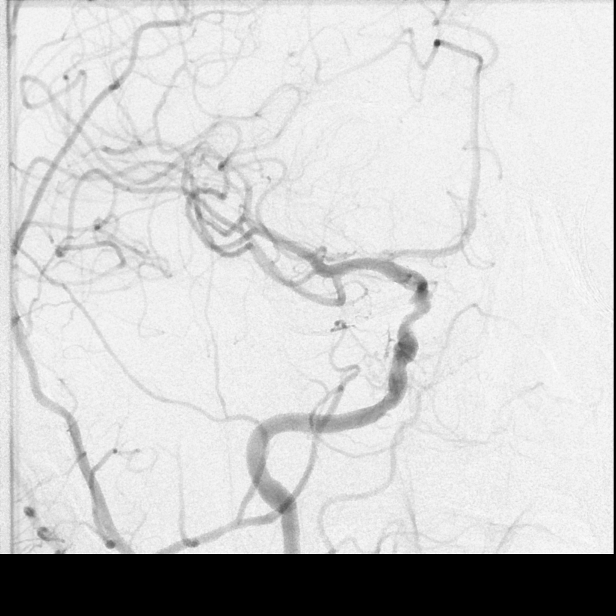
[im 177/226]
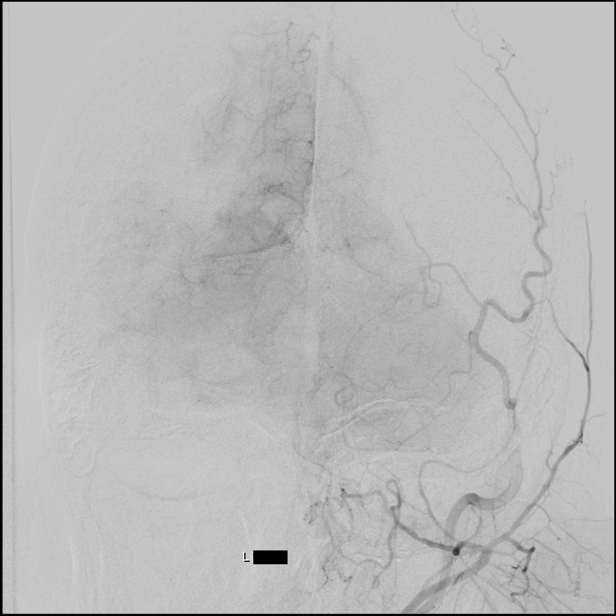
[im 196/226]
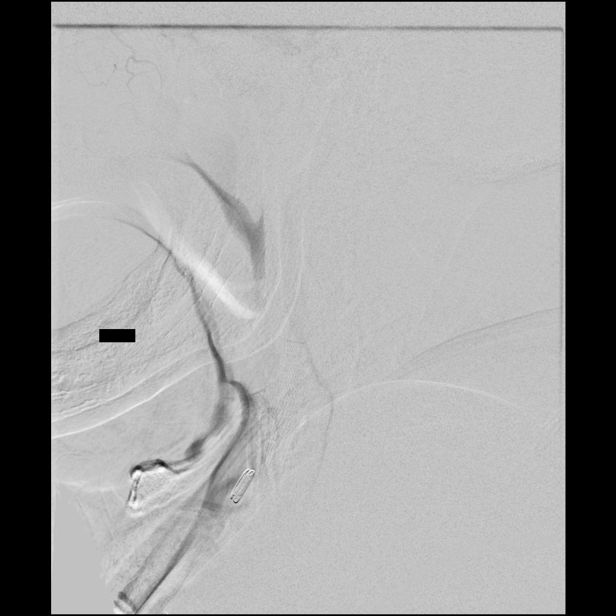
[im 216/226]
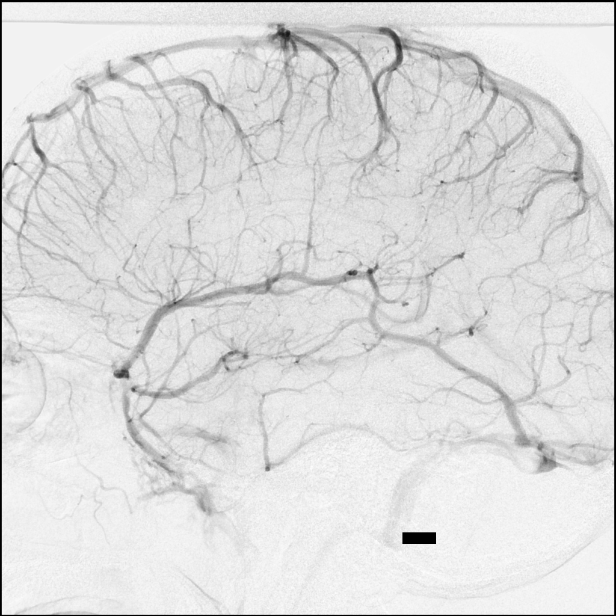

[11 of 24 positions shown; findings below may reference images not displayed]

MEDICATIONS:
Heparin 1,500 units IV. Apresoline 10 mg IV. No antibiotic was
administered within 1 hour of the procedure.

ANESTHESIA/SEDATION:
Versed 2 mg IV; Fentanyl 50 mcg IV.

Moderate Sedation Time:  58 minutes.

The patient was continuously monitored during the procedure by the
interventional radiology nurse under my direct supervision.

CONTRAST:  Isovue 300 approximately 110 mL.

FLUOROSCOPY TIME:  Fluoroscopy Time: 14 minutes 6 seconds (1833
mGy).

COMPLICATIONS:
None immediate.
The right groin was prepped and draped in the usual sterile fashion.
Thereafter using modified Seldinger technique, transfemoral access
into the right common femoral artery was obtained without
difficulty. Over a 0.035 inch guidewire, a 5 French Pinnacle sheath
was inserted. Through this, and also over 0.035 inch guidewire, a 5
French JB 1 catheter was advanced to the aortic arch region and
selectively positioned in the right common carotid artery, the right
subclavian artery, the left common carotid artery and the left
vertebral artery.

A 5 French pigtail catheter was then advanced and positioned in the
ascending thoracic aorta. An arteriogram was then performed at 15
for 30 cc.
FINDINGS: The right common carotid arteriogram demonstrates the right external
carotid artery and its major branches to be widely patent.

The right internal carotid artery in its stented segment distally
demonstrates an approximately 20% stenosis by the NASCET criteria.

The proximal portion of stent is seen to extend and cover the
atherosclerotic plaque in the right common carotid artery.

The right internal carotid artery is seen to opacify to the cranial
skull base.

The petrous segment is widely patent.

There is a mild stenosis at the petrous cavernous junction.

The previously positioned stent in the cavernous segment of the
right internal carotid artery appears patent, with an approximately
50% intra stent stenosis in the caval cavernous segment.

Also demonstrated is a faint opacity in the proximal cavernous
segment within the stent which may represent an atherosclerotic
plaque.

The right middle cerebral artery and the right anterior cerebral
artery opacify into the capillary and venous phases. Focal areas of
caliber irregularity are seen involving the pericallosal branches.

The right vertebral artery origin is widely patent.

The vessel is seen to opacify to the cranial skull base.

Opacification of the right vertebrobasilar junction and the right
posterior-inferior cerebellar artery is noted.

The latter demonstrates mild focal areas of caliber irregularity in
its distal half.

The opacified portion of the basilar artery, the posterior cerebral
arteries, the superior cerebellar arteries and the anterior-inferior
cerebral arteries is grossly normal into the delayed arterial phase.
Non-opacified blood is seen in the basilar artery from the
contralateral vertebral artery.

The left common carotid arteriogram demonstrates nonvisualization of
the left external carotid artery or its branches. The left internal
carotid artery at the bulb demonstrates mild caliber irregularity
without stenosis.

The vessel is, otherwise, seen to opacify to the cranial skull base.

The petrous and the proximal cavernous segment is normal.

There is mild approximately 50% stenosis of the left internal
carotid artery caval cavernous segment.

Distal to this the supraclinoid segment is widely patent.

The left middle cerebral artery and the left anterior cerebral
artery opacify into the capillary and venous phases. Transient
opacification via the anterior communicating artery of the right
anterior cerebral artery A2 segment is noted.

The left vertebral artery origin is patent. There is a mild stenosis
just distal to its origin.

The vessel is, otherwise, seen to opacify normally to the cranial
skull base. The left vertebrobasilar junction and the left posterior
posterior-inferior cerebral artery opacify into the capillary and
venous phases.

The opacified portion of the basilar artery, the posterior cerebral
arteries, the superior cerebellar arteries and the anterior-inferior
cerebellar arteries is normal into the capillary and venous phases.
Non-opacified blood is seen in the basilar artery from the
contralateral vertebral artery.

Also demonstrated is retrograde opacification via the right
posterior communicating artery of the distal right internal carotid
artery terminus, with subsequent opacification of the right middle
and the right anterior cerebral artery distribution with mixing of
copious non-opacified blood from the right anterior circulation as
described.

Retrograde opacification via the ipsilateral occipital artery of the
left external carotid artery branches from the left vertebral artery
injection.

The arch aortogram demonstrates circumferential stenosis of the
proximal innominate artery resulting in approximately 50% stenosis,
due to atherosclerotic disease.

Non intraluminal filling defects are seen.

Also there is mild narrowing of the origin of the left common
carotid artery. Calcific atherosclerotic disease is seen of the
distal innominate artery, the proximal right subclavian artery, and
also the left subclavian artery proximally.
IMPRESSION: Approximately 50% stenoses within the stented segment of the right
internal carotid artery cavernous segment with brisk antegrade flow
noted into the anterior intracranial circulation.

Approximately 20% stenosis of the distal portion of the proximal
stent in the right internal carotid artery.

Approximately 40-50% stenosis of the left internal carotid artery
caval cavernous segment.

Approximately 50% stenosis just distal to the origin of the
innominate artery.

## 2018-04-25 ENCOUNTER — Encounter: Payer: Self-pay | Admitting: *Deleted

## 2018-04-25 ENCOUNTER — Other Ambulatory Visit: Payer: Self-pay

## 2018-04-25 ENCOUNTER — Inpatient Hospital Stay: Payer: Medicare Other | Attending: Internal Medicine | Admitting: Internal Medicine

## 2018-04-25 ENCOUNTER — Encounter: Payer: Self-pay | Admitting: Internal Medicine

## 2018-04-25 ENCOUNTER — Inpatient Hospital Stay: Payer: Medicare Other

## 2018-04-25 VITALS — BP 97/61 | HR 103 | Temp 98.4°F | Resp 19 | Ht 72.0 in

## 2018-04-25 DIAGNOSIS — R911 Solitary pulmonary nodule: Secondary | ICD-10-CM

## 2018-04-25 DIAGNOSIS — F17200 Nicotine dependence, unspecified, uncomplicated: Secondary | ICD-10-CM

## 2018-04-25 LAB — CMP (CANCER CENTER ONLY)
ALT: 17 U/L (ref 0–44)
AST: 17 U/L (ref 15–41)
Albumin: 3.5 g/dL (ref 3.5–5.0)
Alkaline Phosphatase: 90 U/L (ref 38–126)
Anion gap: 12 (ref 5–15)
BUN: 13 mg/dL (ref 8–23)
CO2: 23 mmol/L (ref 22–32)
Calcium: 9.6 mg/dL (ref 8.9–10.3)
Chloride: 109 mmol/L (ref 98–111)
Creatinine: 0.83 mg/dL (ref 0.61–1.24)
GFR, Est AFR Am: >60 mL/min (ref >60)
GFR, Estimated: >60 mL/min (ref >60)
GLUCOSE: 122 mg/dL — AB (ref 70–99)
Potassium: 4 mmol/L (ref 3.5–5.1)
SODIUM: 144 mmol/L (ref 135–145)
Total Bilirubin: 0.5 mg/dL (ref 0.3–1.2)
Total Protein: 6.8 g/dL (ref 6.5–8.1)

## 2018-04-25 LAB — CBC WITH DIFFERENTIAL (CANCER CENTER ONLY)
Abs Immature Granulocytes: 0.02 10*3/uL (ref 0.00–0.07)
Basophils Absolute: 0.1 10*3/uL (ref 0.0–0.1)
Basophils Relative: 1 %
Eosinophils Absolute: 0.8 10*3/uL — ABNORMAL HIGH (ref 0.0–0.5)
Eosinophils Relative: 12 %
HCT: 36.1 % — ABNORMAL LOW (ref 39.0–52.0)
Hemoglobin: 10.5 g/dL — ABNORMAL LOW (ref 13.0–17.0)
Immature Granulocytes: 0 %
Lymphocytes Relative: 22 %
Lymphs Abs: 1.4 10*3/uL (ref 0.7–4.0)
MCH: 24.5 pg — ABNORMAL LOW (ref 26.0–34.0)
MCHC: 29.1 g/dL — ABNORMAL LOW (ref 30.0–36.0)
MCV: 84.1 fL (ref 80.0–100.0)
MONOS PCT: 13 %
Monocytes Absolute: 0.8 10*3/uL (ref 0.1–1.0)
NEUTROS ABS: 3.3 10*3/uL (ref 1.7–7.7)
Neutrophils Relative %: 52 %
Platelet Count: 167 10*3/uL (ref 150–400)
RBC: 4.29 MIL/uL (ref 4.22–5.81)
RDW: 16.8 % — ABNORMAL HIGH (ref 11.5–15.5)
WBC Count: 6.4 10*3/uL (ref 4.0–10.5)
nRBC: 0 % (ref 0.0–0.2)

## 2018-04-25 NOTE — Progress Notes (Signed)
Oncology Nurse Navigator Documentation  Oncology Nurse Navigator Flowsheets 04/25/2018  Navigator Location [facility]-  Referral date to RadOnc/MedOnc -  Navigator Encounter Type Clinic/MDC/I spoke with patient today at clinic.  I help to explain next steps.  He does not have a dx of cancer but abnormal scan.  He will get further work up then biopsy.    Telephone -  Patient Visit Type MedOnc  Treatment Phase Abnormal Scans  Barriers/Navigation Needs Education  Education Other  Interventions Education  Coordination of Care -  Education Method Verbal  Acuity Level 1  Time Spent with Patient 15

## 2018-04-25 NOTE — Progress Notes (Signed)
[place] [phone]   [phone]  CONSULT NOTE  REFERRING PHYSICIAN: Dr. Charlott Rakes  REASON FOR CONSULTATION:  68 years old white male with right lung pulmonary nodule  HPI [name] is a 68 y.o. male with past medical history significant for multiple medical problems including history of hypertension, diabetes mellitus, dyslipidemia, stroke, seizure, peripheral vascular disease, deep venous thrombosis, depression, COPD, congestive heart failure, alcohol and drug abuse as well as long history for smoking.  The patient was admitted to [facility] in February 2020 with left-sided arm and leg weakness as well as left facial droop and slurred speech after smoking crack cocaine.  During his evaluation he had chest x-ray performed on 03/27/2018 and that showed 1.7 cm right mid lung nodule.  He has a previous chest x-ray on January 09, 2018 that showed 0.9 cm right upper lobe nodule.  On 03/28/2018 the patient had CT scanning of the chest and it showed a lobulated 2.0 cm right upper lobe pulmonary nodule.  The patient was referred to me today for further evaluation and recommendation regarding this abnormality seen on his CT scan of the chest. He is currently a resident of a skilled nursing facility. When seen today the patient is complaining of runny nose as well as cough with no significant hemoptysis.  He also has shortness of breath with exertion but no significant chest pain.  He mentioned that he lost around 50 pounds in the last 6 months.  He denied having any nausea, vomiting, diarrhea or constipation.  He denied having any fever or chills.  He has blurry vision but no headaches. Family history significant for mother with heart disease, father with liver cirrhosis. The patient is a widow and has no children.  He is currently a resident of a skilled nursing facility for rehabilitation.  He has a history of smoking 2 pack/day for 50 years  and unfortunately he continues to smoke.  The patient also has a history of heavy alcohol abuse and still drinking occasionally.  He also has a history of drug abuse including crack cocaine.  HPI  Past Medical History:  Diagnosis Date  . Acute blood loss anemia   . Acute CVA (cerebrovascular accident) ([place]) 08/27/2014  . Acute encephalopathy 11/28/2017  . Acute ischemic stroke ([place])   . AKI (acute kidney injury) ([place])   . Alcohol abuse    H/O  . Alcohol dependence with withdrawal with complication ([place]) 9/92/4268  . Anxiety   . Arterial ischemic stroke, MCA (middle cerebral artery), right, acute ([place])   . Arthritis   . Asthma   . Binocular vision disorder with diplopia 01/03/2013  . CAD (coronary artery disease) 05/27/10   Cath: severe single vessell CAD left cx midportion obtuse marginal 2 to 3.  . Cerebral infarction due to embolism of right middle cerebral artery ([place])   . Cerebral infarction due to stenosis of right carotid artery ([place]) 11/01/2014  . Cerebral thrombosis with cerebral infarction 07/18/2016  . Cerebrovascular accident (CVA) due to stenosis of right carotid artery ([place])   . CHF (congestive heart failure) ([place])   . Closed blow-out fracture of floor of orbit ([place]) 01/19/2013  . COPD (chronic obstructive pulmonary disease) ([place])   . COPD (chronic obstructive pulmonary disease) ([place])   . CVA (cerebral vascular accident) ([place]) 08/26/2014  . Dementia due to another medical condition ([place]) 07/12/2015  . Depression   . Diabetes mellitus type 2 in obese ([place])   .  Diabetes mellitus, type 2 ([place]) 03/13/2015  . Diastolic CHF, acute on chronic (HCC) 11/01/2014  . DOE (dyspnea on exertion) 11/23/2014  . DVT (deep venous thrombosis) ([place]) 02/22/2014  . Embolic stroke ([place])   . Enophthalmos due to trauma 01/19/2013  . Eyelid lesion 01/24/2013  . Fracture of inferior orbital wall ([place]) 01/03/2013  . Fracture of orbital floor ([place]) 01/03/2013  . GERD (gastroesophageal reflux disease)    . H/O total knee replacement 10/24/2014  . HCAP (healthcare-associated pneumonia) 10/15/2015  . Headache around the eyes 08/27/2014  . History of DVT (deep vein thrombosis)   . History of DVT of lower extremity   . Hyperkalemia 10/24/2014  . Hyperlipemia   . Hyperlipidemia   . Hypertension   . Hypoalbuminemia due to protein-calorie malnutrition ([place])   . Hypotension   . Labile blood pressure   . Left shoulder pain 09/28/2017  . Lower extremity edema 02/21/2014  . Medial orbital wall fracture 01/19/2013  . Myocardial infarction ([place]) 2000  . Orbital fracture 12/2012  . OSA on CPAP   . Pain of left eye 01/03/2013  . Peripheral vascular disease, unspecified ([place])    08/20/10 doppler: increase in right ABI post-op. Left ABI stable. S/P bi-fem bypass surgery  . Pneumonia 04/04/2012  . Right middle cerebral artery stroke ([place]) 01/18/2018  . Seizures ([place])   . Shortness of breath   . Stroke (cerebrum) ([place]) 02/06/2015  . Stroke ([place])   . Thrombocytopenia ([place])   . Tobacco abuse   . Trichiasis without entropion 04/16/2013   Overview:  LLL central   . Type 2 diabetes mellitus with peripheral neuropathy ([place]) 03/13/2015  . Urinary incontinence 03/14/2015  . Urinary incontinence 03/14/2015    Past Surgical History:  Procedure Laterality Date  . abdoninal ao angio & bifem angio  05/27/10   Patent graft, occluded bil stents with no retrograde flow into the hypogastric arteries. 100% occl left ant. tibial artery, 70% to 80% to 100% stenosis right superficial fem artery above adductor canal. 100 % occl right ant. tibial vessell  . Aortogram w/ PTCA  09/292003  . BACK SURGERY    . CARDIAC CATHETERIZATION  05/27/10   severe CAD left cx  . CHOLECYSTECTOMY    . ESOPHAGOGASTRIC FUNDOPLICATION    . EYE SURGERY    . FEMORAL BYPASS  08/19/10   Right Fem-Pop  . IR ANGIO INTRA EXTRACRAN SEL COM CAROTID INNOMINATE BILAT MOD SED  07/20/2016  . IR ANGIO INTRA EXTRACRAN SEL COM CAROTID INNOMINATE BILAT MOD SED   01/14/2018  . IR ANGIO VERTEBRAL SEL SUBCLAVIAN INNOMINATE UNI R MOD SED  07/20/2016  . IR ANGIO VERTEBRAL SEL VERTEBRAL BILAT MOD SED  01/14/2018  . IR ANGIO VERTEBRAL SEL VERTEBRAL UNI L MOD SED  07/20/2016  . IR RADIOLOGIST EVAL & MGMT  05/27/2016  . IR US GUIDE VASC ACCESS RIGHT  01/14/2018  . JOINT REPLACEMENT    . PERIPHERAL VASCULAR CATHETERIZATION N/A 12/10/2014   Procedure: Abdominal Aortogram;  Surgeon: Angelia Mould, MD;  Location: [facility];  Service: Cardiovascular;  Laterality: N/A;  . RADIOLOGY WITH ANESTHESIA N/A 02/08/2015   Procedure: RADIOLOGY WITH ANESTHESIA;  Surgeon: Luanne Bras, MD;  Location: [facility];  Service: Radiology;  Laterality: N/A;  . TEE WITHOUT CARDIOVERSION N/A 08/29/2014   Procedure: TRANSESOPHAGEAL ECHOCARDIOGRAM (TEE);  Surgeon: Josue Hector, MD;  Location: [facility];  Service: Cardiovascular;  Laterality: N/A;  . TOTAL KNEE ARTHROPLASTY      Family History  Problem Relation Age of Onset  . Heart attack Mother   . Heart failure Mother   . Cirrhosis Father   . Heart failure Brother   . Cancer Brother   . Hypertension Neg Hx        UNKNOWN  . Stroke Neg Hx        UNKNOWN    Social History Social History   Tobacco Use  . Smoking status: Current Every Day Smoker    Packs/day: 0.50    Years: 50.00    Pack years: 25.00    Types: Cigars, Cigarettes    Start date: 02/09/1961  . Smokeless tobacco: Former Systems developer    Types: Chew  . Tobacco comment: 2 ppd, full flavor  Substance Use Topics  . Alcohol use: Yes    Comment: occasionally, "I might drink 1 every 6 months"  . Drug use: Yes    Types: Cocaine    Comment: "I smoke crack every once in a while"; last smoked yesterday    Allergies  Allergen Reactions  . Darvocet [Propoxyphene N-Acetaminophen] Hives  . Haldol [Haloperidol Decanoate] Hives  . Acetaminophen Nausea Only    Upset stomach, tolerates Hydrocodone/APAP if taken with food  . Metformin And Related Nausea And  Vomiting  . Norco [Hydrocodone-Acetaminophen] Nausea And Vomiting    Tolerates if taken with food    Current Outpatient Medications  Medication Sig Dispense Refill  . acetaminophen (TYLENOL) 325 MG tablet Take 2 tablets (650 mg total) by mouth every 4 (four) hours as needed for mild pain (or temp > 37.5 C (99.5 F)).    [name] allopurinol (ZYLOPRIM) 300 MG tablet Take 1 tablet (300 mg total) by mouth daily. 30 tablet 3  . aspirin 325 MG tablet Take 1 tablet (325 mg total) by mouth daily.    [name] atorvastatin (LIPITOR) 80 MG tablet Take 1 tablet (80 mg total) by mouth daily at 6 PM. 30 tablet 3  . baclofen (LIORESAL) 10 MG tablet Take 1 tablet (10 mg total) by mouth at bedtime. 30 each 0  . camphor-menthol (SARNA) lotion Apply topically as needed for itching. 222 mL 0  . clopidogrel (PLAVIX) 75 MG tablet Take 1 tablet (75 mg total) by mouth daily. 30 tablet 3  . diphenhydramine-acetaminophen (TYLENOL PM) 25-500 MG TABS tablet Take 1 tablet by mouth at bedtime as needed. 14 tablet 0  . DULoxetine (CYMBALTA) 60 MG capsule Take 1 capsule (60 mg total) by mouth daily. 30 capsule 3  . folic acid (FOLVITE) 1 MG tablet Take 1 tablet (1 mg total) by mouth daily.    . hydrOXYzine (ATARAX/VISTARIL) 25 MG tablet Take 1 tablet (25 mg total) by mouth every 8 (eight) hours as needed for itching. 30 tablet 0  . levETIRAcetam (KEPPRA) 500 MG tablet Take 1 tablet (500 mg total) by mouth 2 (two) times daily. 60 tablet 3  . losartan (COZAAR) 25 MG tablet Take 1 tablet (25 mg total) by mouth daily. 30 tablet 2  . memantine (NAMENDA) 10 MG tablet Take 1 tablet (10 mg total) by mouth 2 (two) times daily. 6 tablet 0  . mometasone-formoterol (DULERA) 200-5 MCG/ACT AERO Inhale 2 puffs into the lungs 2 (two) times daily.    . nicotine (NICODERM CQ - DOSED IN MG/24 HOURS) 21 mg/24hr patch Place 1 patch (21 mg total) onto the skin daily. 28 patch 0  . pantoprazole (PROTONIX) 40 MG tablet Take 1 tablet (40 mg total) by mouth daily.  30 tablet 3  .  pregabalin (LYRICA) 150 MG capsule Take 1 capsule (150 mg total) by mouth 2 (two) times daily. 180 capsule 0  . WIXELA INHUB 250-50 MCG/DOSE AEPB Inhale 1-2 puffs into the lungs daily. 1 each 0   No current facility-administered medications for this visit.     Review of Systems  Constitutional: positive for fatigue and weight loss Eyes: negative Ears, nose, mouth, throat, and face: negative Respiratory: positive for cough and dyspnea on exertion Cardiovascular: negative Gastrointestinal: negative Genitourinary:negative Integument/breast: negative Hematologic/lymphatic: negative Musculoskeletal:positive for muscle weakness Neurological: positive for weakness Behavioral/Psych: negative Endocrine: negative Allergic/Immunologic: negative  Physical Exam  SHF:WYOVZ, healthy, no distress, well nourished and well developed SKIN: skin color, texture, turgor are normal, no rashes or significant lesions HEAD: Normocephalic, No masses, lesions, tenderness or abnormalities EYES: normal, PERRLA, Conjunctiva are pink and non-injected EARS: External ears normal, Canals clear OROPHARYNX:no exudate, no erythema and lips, buccal mucosa, and tongue normal  NECK: supple, no adenopathy, no JVD LYMPH:  no palpable lymphadenopathy, no hepatosplenomegaly LUNGS: clear to auscultation , and palpation HEART: regular rate & rhythm, no murmurs and no gallops ABDOMEN:abdomen soft, non-tender, normal bowel sounds and no masses or organomegaly BACK: No CVA tenderness, Range of motion is normal EXTREMITIES:no joint deformities, effusion, or inflammation, no edema  NEURO: alert & oriented x 3 with fluent speech, no focal motor/sensory deficits  PERFORMANCE STATUS: ECOG 1-2  LABORATORY DATA: Lab Results  Component Value Date   WBC 6.4 04/25/2018   HGB 10.5 (L) 04/25/2018   HCT 36.1 (L) 04/25/2018   MCV 84.1 04/25/2018   PLT 167 04/25/2018      Chemistry      Component Value  Date/Time   NA 143 03/28/2018 0616   NA 145 (H) 08/19/2017 0952   K 4.1 03/28/2018 0616   CL 110 03/28/2018 0616   CO2 26 03/28/2018 0616   BUN 9 03/28/2018 0616   BUN 14 08/19/2017 0952   CREATININE 0.80 03/28/2018 0616   CREATININE CANCELED 04/28/2016 1515      Component Value Date/Time   CALCIUM 9.2 03/28/2018 0616   ALKPHOS 66 03/25/2018 1448   AST 19 03/25/2018 1448   ALT 14 03/25/2018 1448   BILITOT 0.4 03/25/2018 1448   BILITOT 0.3 08/19/2017 0952       RADIOGRAPHIC STUDIES: Dg Chest 2 View  Result Date: 03/27/2018 CLINICAL DATA:  Leg and face weakness. EXAM: CHEST - 2 VIEW COMPARISON:  Multiple chest x-rays since May 29, 2016 FINDINGS: Again noted is a nodule in the right mid lung measuring up to 17 mm. CT is been recommended previously but I see no evidence 1 has been performed. No other nodules or masses. Probable atelectasis in the left base. No other interval changes or acute abnormalities. IMPRESSION: 1. There is a 17 mm nodule in the right mid lung. Recommend a CT scan for further characterization. 2. Opacity in the left base favored represent atelectasis. 3. No other acute abnormalities. Electronically Signed   By: Dorise Bullion III M.D   On: 03/27/2018 14:34   Ct Chest W Contrast  Result Date: 03/28/2018 CLINICAL DATA:  Follow-up lung nodule suspected on chest radiograph EXAM: CT CHEST WITH CONTRAST TECHNIQUE: Multidetector CT imaging of the chest was performed during intravenous contrast administration. CONTRAST:  5mL OMNIPAQUE IOHEXOL 300 MG/ML  SOLN COMPARISON:  Chest radiograph, 03/27/2018 FINDINGS: Cardiovascular: Cardiomegaly with 3 vessel coronary artery calcifications. No pericardial effusion. There is severe aortic atherosclerosis with approximately 50% mixed atherosclerotic stenosis of the origin of  the brachiocephalic artery (series 3, image 37, series 5, image 73). Mediastinum/Nodes: No enlarged mediastinal, hilar, or axillary lymph nodes. Thyroid gland,  trachea, and esophagus demonstrate no significant findings. Lungs/Pleura: There is a lobulated 2.0 cm pulmonary nodule of the right upper lobe with subtle adjacent spiculation and architectural distortion (series 7, image 56). There are additional small nonspecific pulmonary nodules present elsewhere, for example a 4 mm nodule of the right middle lobe (series 7, image 86). Bibasilar atelectasis or scarring. No pleural effusion or pneumothorax. Upper Abdomen: No acute abnormality. Musculoskeletal: No chest wall mass or suspicious bone lesions identified. IMPRESSION: 1. There is a lobulated 2.0 cm pulmonary nodule of the right upper lobe with subtle adjacent spiculation and architectural distortion (series 7, image 56). This nodule is highly suspicious for malignancy. Given size and location, diagnostic options include PET-CT for metabolic characterization and percutaneous CT-guided biopsy. At minimum, recommend follow-up CT in 3 months to ensure stability. 2.  Coronary artery disease. 3. There is severe aortic atherosclerosis with approximately 50% mixed atherosclerotic stenosis of the origin of the brachiocephalic artery (series 3, image 37, series 5, image 73). Electronically Signed   By: Eddie Candle M.D.   On: 03/28/2018 14:50   US Abdomen Complete  Result Date: 03/26/2018 CLINICAL DATA:  Recurrent abdominal pain. Recent abdominal pain. Cholecystectomy. EXAM: ABDOMEN ULTRASOUND COMPLETE COMPARISON:  None. FINDINGS: Gallbladder: Surgically absent. Common bile duct: Diameter: 2.1 Liver: Increased echogenicity in the liver with no focal mass. Portal vein is patent on color Doppler imaging with normal direction of blood flow towards the liver. IVC: No abnormality visualized. Pancreas: Not visualized due to shadowing bowel gas. Spleen: Size and appearance within normal limits. Right Kidney: Length: 10.5 cm. Echogenicity within normal limits. No mass or hydronephrosis visualized. Left Kidney: Length: 12.5 cm.  Echogenicity within normal limits. No mass or hydronephrosis visualized. Abdominal aorta: Measures 3.2 cm proximally. Other findings: None. IMPRESSION: 1. Mild aneurysmal dilatation of the abdominal aorta, measuring 3.2 cm proximally. Recommend followup by ultrasound in 3 years. This recommendation follows ACR consensus guidelines: White Paper of the ACR Incidental Findings Committee II on Vascular Findings. J Am Coll Radiol 2013; 19:147-829 2. Probable hepatic steatosis. Electronically Signed   By: Dorise Bullion III M.D   On: 03/26/2018 17:35    ASSESSMENT: This is a very pleasant 68 years old white male with right upper lobe pulmonary nodule suspicious for bronchogenic carcinoma.  The patient has multiple medical problems.   PLAN: I had a lengthy discussion with the patient today about his current condition and further investigation to confirm his diagnosis. I personally and independently reviewed the scan images and discussed the results with the patient today. I recommended for the patient to have a PET scan for further evaluation of the pulmonary nodule.  If the nodule is hypermetabolic on the PET scan, I will arrange for the patient to have CT-guided core biopsy of the right upper lobe pulmonary nodule for tissue diagnosis. I will see the patient back for follow-up visit after his PET scan and biopsy for further recommendation regarding treatment of his condition. If there is no other concerning metastatic disease and the biopsy confirmed lung cancer, the patient may be referred to radiation oncology for consideration of SBRT. The patient will come back for follow-up visit in 3-4 weeks for more detailed discussion of his treatment options. I strongly advised him to quit smoking, alcohol and drug abuse. For the multiple medical problems, he will continue his routine follow-up visit with  his primary care physician. The patient was advised to call immediately if he has any concerning symptoms in  the interval. The patient voices understanding of current disease status and treatment options and is in agreement with the current care plan.  All questions were answered. The patient knows to call the clinic with any problems, questions or concerns. We can certainly see the patient much sooner if necessary.  Thank you so much for allowing me to participate in the care of [name]. I will continue to follow up the patient with you and assist in his care.  I spent 40 minutes counseling the patient face to face. The total time spent in the appointment was 60 minutes.  Disclaimer: This note was dictated with voice recognition software. Similar sounding words can inadvertently be transcribed and may not be corrected upon review.   Eilleen Kempf April 25, 2018, 2:49 PM

## 2018-04-27 ENCOUNTER — Telehealth: Payer: Self-pay | Admitting: Internal Medicine

## 2018-04-27 NOTE — Telephone Encounter (Signed)
Left message re 4/7 f/u. Schedule mailed. Central radiology will contact patient re pet/bx.

## 2018-05-02 ENCOUNTER — Telehealth: Payer: Self-pay | Admitting: *Deleted

## 2018-05-02 ENCOUNTER — Ambulatory Visit (HOSPITAL_COMMUNITY): Payer: Medicare Other

## 2018-05-02 DIAGNOSIS — L22 Diaper dermatitis: Secondary | ICD-10-CM | POA: Diagnosis not present

## 2018-05-02 DIAGNOSIS — R0989 Other specified symptoms and signs involving the circulatory and respiratory systems: Secondary | ICD-10-CM | POA: Diagnosis not present

## 2018-05-02 DIAGNOSIS — R05 Cough: Secondary | ICD-10-CM | POA: Diagnosis not present

## 2018-05-02 NOTE — Telephone Encounter (Signed)
Oncology Nurse Navigator Documentation  Oncology Nurse Navigator Flowsheets 05/02/2018  Navigator Location [facility]  Referral date to RadOnc/MedOnc -  Navigator Encounter Type Telephone/I called patient to see why he cancelled his PET scan.  I was unable to reach him.  I did speak to central scheduling about [name] and updated I am trying to get in touch with him to see what he needs so we can schedule him.   Telephone Outgoing Call  Patient Visit Type -  Treatment Phase Abnormal Scans  Barriers/Navigation Needs Education  Education Other  Interventions Education  Coordination of Care -  Education Method Verbal  Acuity Level 2  Time Spent with Patient 30

## 2018-05-04 ENCOUNTER — Telehealth: Payer: Self-pay | Admitting: *Deleted

## 2018-05-04 DIAGNOSIS — Z79899 Other long term (current) drug therapy: Secondary | ICD-10-CM | POA: Diagnosis not present

## 2018-05-04 DIAGNOSIS — R799 Abnormal finding of blood chemistry, unspecified: Secondary | ICD-10-CM | POA: Diagnosis not present

## 2018-05-04 DIAGNOSIS — D649 Anemia, unspecified: Secondary | ICD-10-CM | POA: Diagnosis not present

## 2018-05-04 NOTE — Telephone Encounter (Signed)
Oncology Nurse Navigator Documentation  Oncology Nurse Navigator Flowsheets 05/04/2018  Navigator Location [facility]  Referral date to RadOnc/MedOnc -  Navigator Encounter Type Telephone/I followed up on [name] PET scan.  It is authorized but not scheduled.  I called [name] to get schedule but I was unable to reach him I did leave a vm message to call the cancer center so it can get scheduled.   Telephone Outgoing Call  Patient Visit Type -  Treatment Phase Abnormal Scans  Barriers/Navigation Needs Education  Education Other  Interventions Education  Coordination of Care -  Education Method Verbal  Acuity Level 2  Time Spent with Patient 15

## 2018-05-05 DIAGNOSIS — R222 Localized swelling, mass and lump, trunk: Secondary | ICD-10-CM | POA: Diagnosis not present

## 2018-05-05 DIAGNOSIS — G4700 Insomnia, unspecified: Secondary | ICD-10-CM | POA: Diagnosis not present

## 2018-05-05 DIAGNOSIS — I6930 Unspecified sequelae of cerebral infarction: Secondary | ICD-10-CM | POA: Diagnosis not present

## 2018-05-05 DIAGNOSIS — M62838 Other muscle spasm: Secondary | ICD-10-CM | POA: Diagnosis not present

## 2018-05-05 DIAGNOSIS — L299 Pruritus, unspecified: Secondary | ICD-10-CM | POA: Diagnosis not present

## 2018-05-13 DIAGNOSIS — G4701 Insomnia due to medical condition: Secondary | ICD-10-CM | POA: Diagnosis not present

## 2018-05-16 ENCOUNTER — Telehealth: Payer: Self-pay | Admitting: *Deleted

## 2018-05-16 NOTE — Telephone Encounter (Signed)
TCT to pt's significant other, [name].  She states patient remains in [facility] for the foreseeable future. I informed her that he had an appointment with Dr. Julien Nordmann tomorrow to review PET scan. Pt cancelled his PET scan last month.  [name] stated that he would be coming to his appointment tomorrow.

## 2018-05-17 ENCOUNTER — Inpatient Hospital Stay: Payer: Medicare Other | Admitting: Internal Medicine

## 2018-05-24 ENCOUNTER — Other Ambulatory Visit: Payer: Self-pay

## 2018-05-24 NOTE — Patient Outreach (Signed)
[facility]) Care Management  05/24/2018  [name] 1950-08-26 [id number]   Medication Adherence call to [name] patient home number belongs to someone else patient cell phone number is disconnected. [name] is showing past due on Losartan 25 mg under [facility].   [facility] Management Direct Dial [phone]  Fax [phone] [email or web address]

## 2018-05-29 IMAGING — MR MR HEAD W/O CM
9 of 10 series · 36 of 48 positions shown · non-contrast
Comparison: 07/18/16

CLINICAL DATA: Recent right MCA infarct. New right facial numbness.

EXAM:
MRI HEAD WITHOUT CONTRAST
TECHNIQUE: Multiplanar, multiecho pulse sequences of the brain and surrounding
structures were obtained without intravenous contrast.

[Series 3: DWI · axial · 3.0mm · 1.09mm/px · z∈[-87,+57]mm · 9 of 98 slices shown (1 of 4)]
[im 1/98]
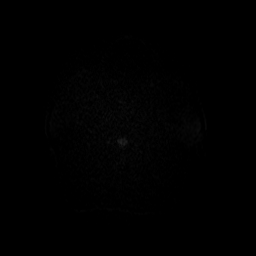
[im 13/98]
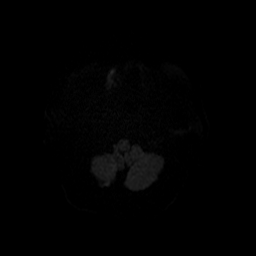
[im 25/98]
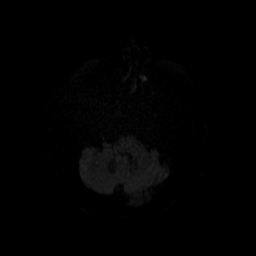
[im 37/98]
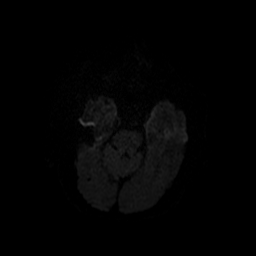
[im 49/98]
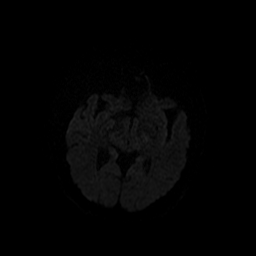
[im 61/98]
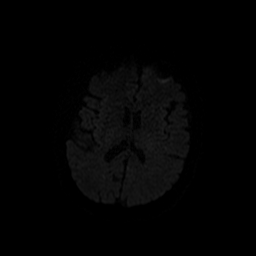
[im 73/98]
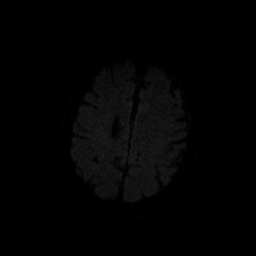
[im 85/98]
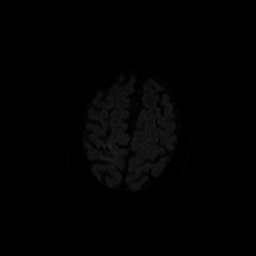
[im 98/98]
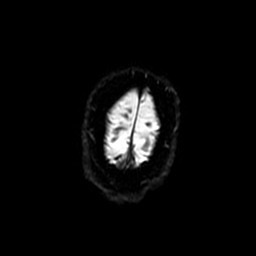

[Series 4: T1 · sagittal · 5.0mm · 0.47mm/px · 2 of 23 slices shown]
[im 1/23]
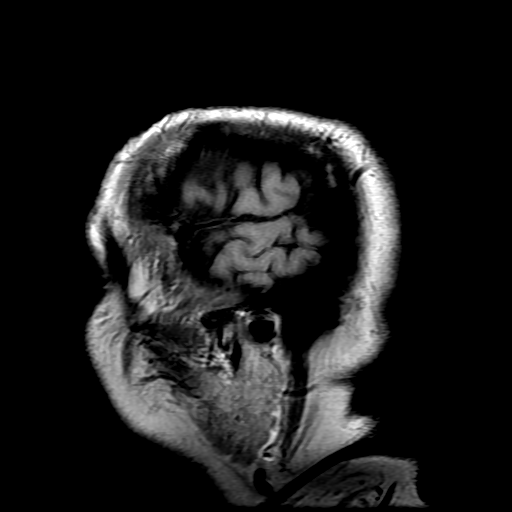
[im 23/23]
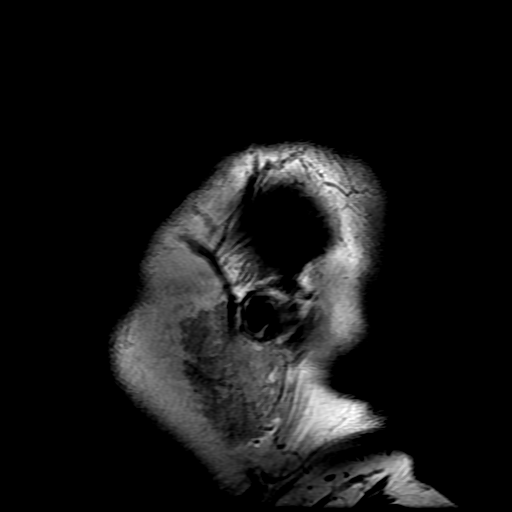

[Series 5: T2 · axial · 5.0mm · 0.43mm/px · z∈[-86,+57]mm · 3 of 25 slices shown (1 of 2)]
[im 1/25]
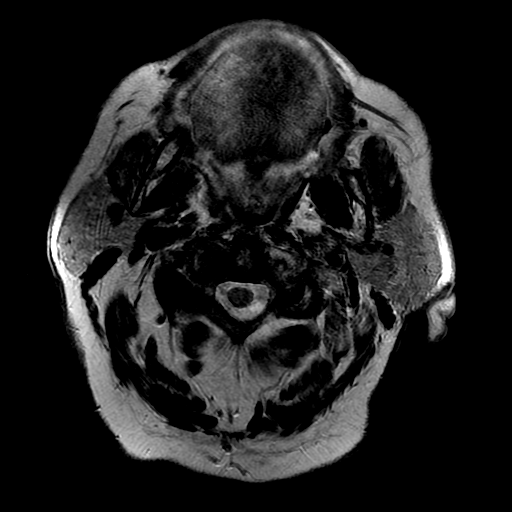
[im 13/25]
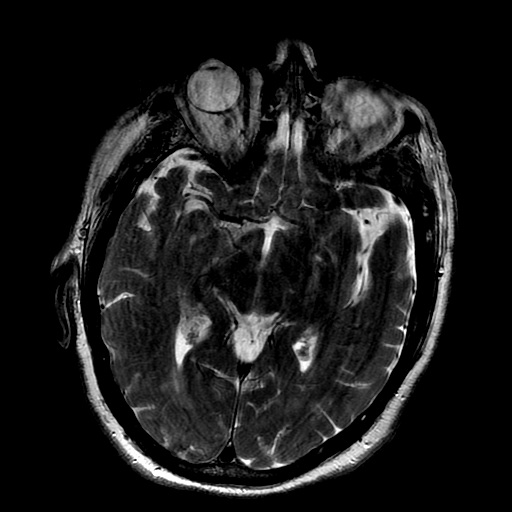
[im 25/25]
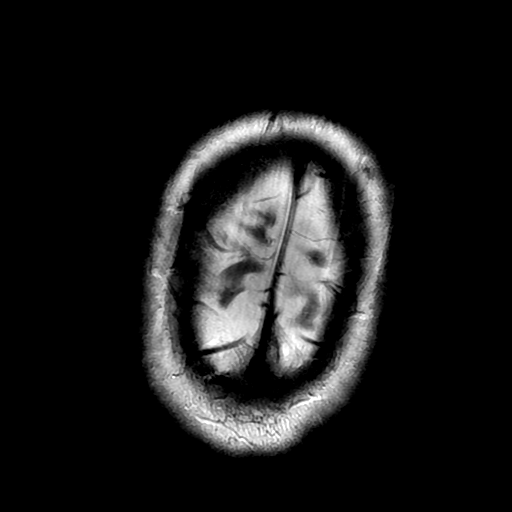

[Series 6: ax mpgr · axial · 5.0mm · 0.43mm/px · 1 of 25 slices shown]
[im 1/25]
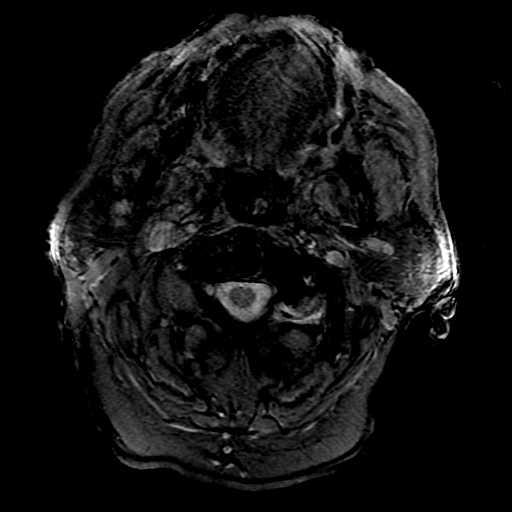

[Series 7: DWI · coronal · 5.0mm · 1.09mm/px · 7 of 66 slices shown (2 of 4)]
[im 1/66]
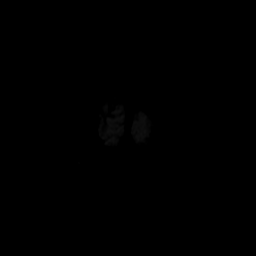
[im 11/66]
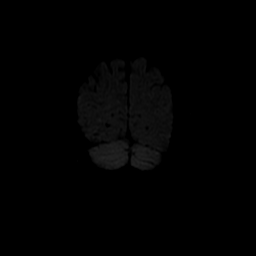
[im 22/66]
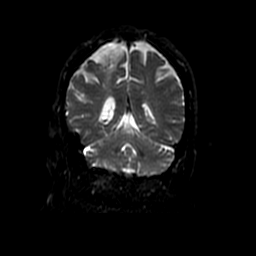
[im 33/66]
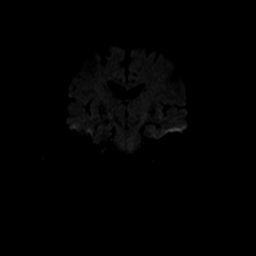
[im 44/66]
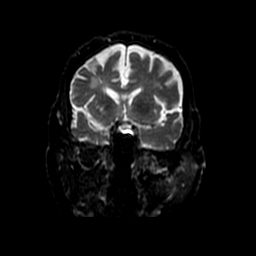
[im 55/66]
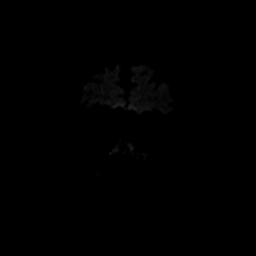
[im 66/66]
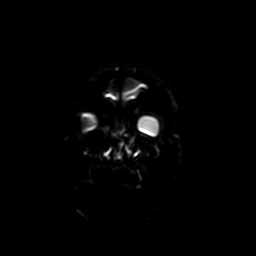

[Series 8: T2 · coronal · 5.0mm · 0.39mm/px · 3 of 25 slices shown (2 of 2)]
[im 1/25]
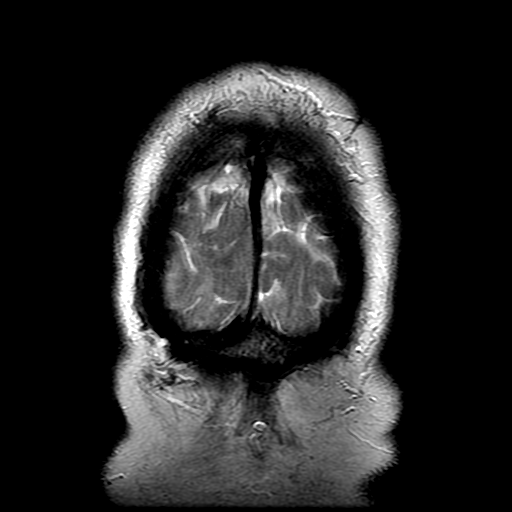
[im 13/25]
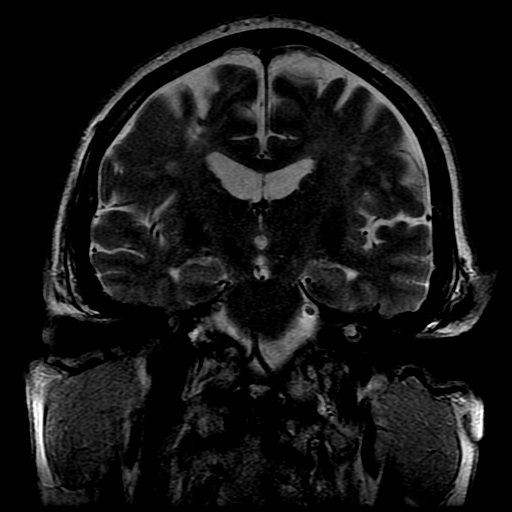
[im 25/25]
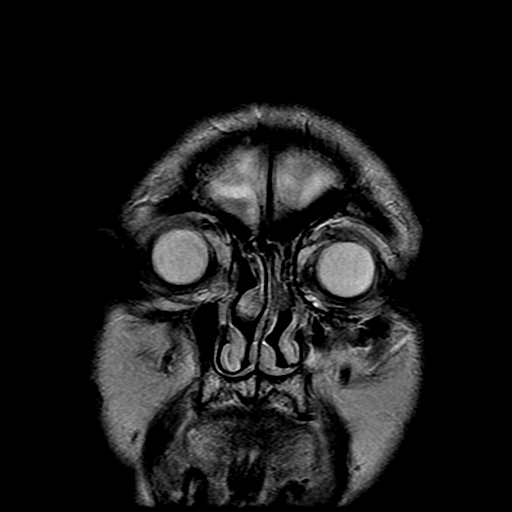

[Series 10: FLAIR · axial · 5.0mm · 0.43mm/px · z∈[-86,+57]mm · 3 of 25 slices shown]
[im 1/25]
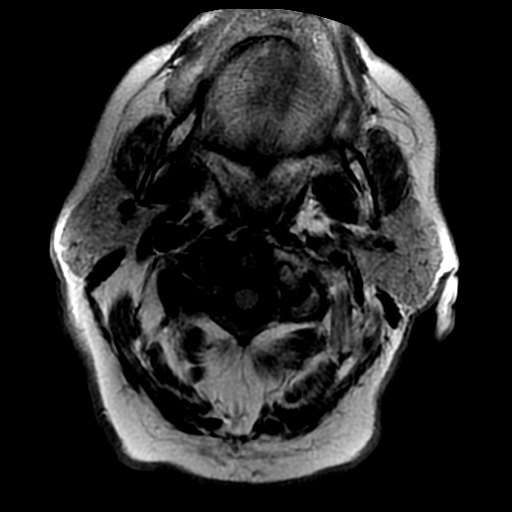
[im 13/25]
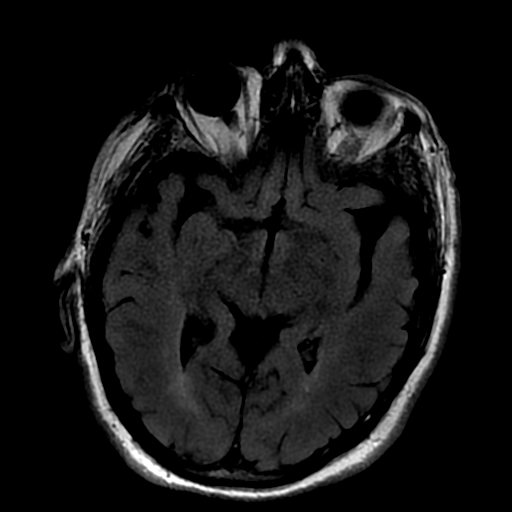
[im 25/25]
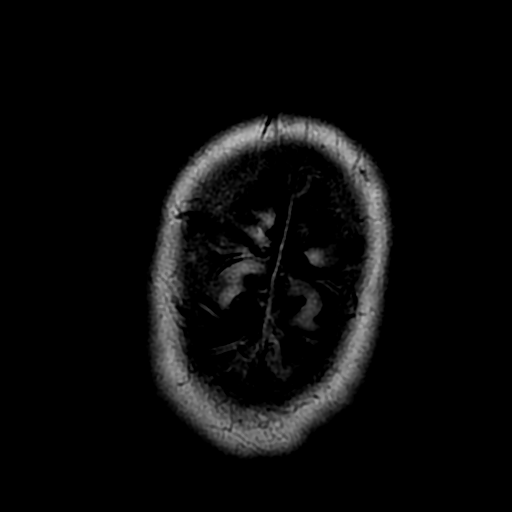

[Series 300: DWI · axial · 3.0mm · 1.09mm/px · z∈[-87,+57]mm · 5 of 49 slices shown (3 of 4)]
[im 1/49]
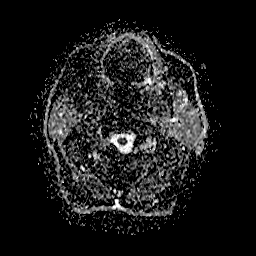
[im 13/49]
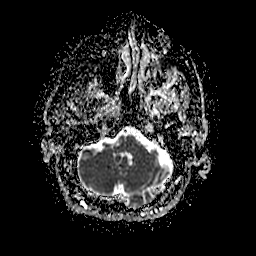
[im 25/49]
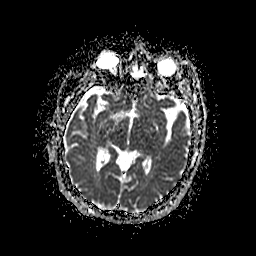
[im 37/49]
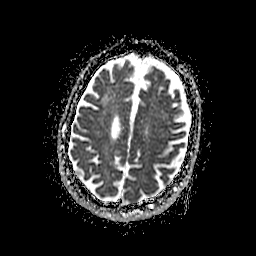
[im 49/49]
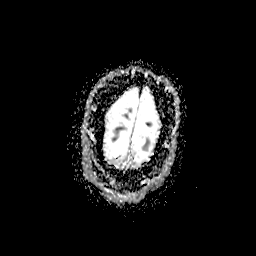

[Series 700: DWI · coronal · 5.0mm · 1.09mm/px · 3 of 33 slices shown (4 of 4)]
[im 1/33]
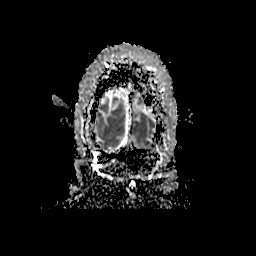
[im 17/33]
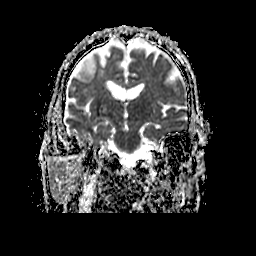
[im 33/33]
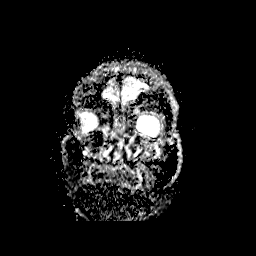

[36 of 48 positions shown; findings below may reference images not displayed]

FINDINGS: Brain: No acute infarction, hemorrhage, hydrocephalus, extra-axial
collection or mass lesion.

Expected evolution of recent right posterior frontal MCA infarct,
including mineralization.

Chronic small vessel ischemia greater in the right cerebral
hemisphere - patient is status Fadlillah Kahani stenting.

Chronic subarachnoid or cortical hemosiderin along the posterior
right cerebral hemisphere.

No findings along the right trigeminal nerves.

Vascular: Normal flow voids.

Skull and upper cervical spine:  C2-3 ankylosis.

Sinuses/Orbits:  Chronic left maxillary sinusitis.

Bilateral cataract resection.
IMPRESSION: 1. No acute finding.
2. Expected evolution of recent right MCA territory infarct.
3. Chronic small vessel ischemia.

## 2018-05-29 IMAGING — CT CT HEAD W/O CM
4 series · 17 of 47 positions shown, 19 images · non-contrast
Comparison: 07/18/2016

CLINICAL DATA: History of CVA with right face tingling and
numbness.

EXAM:
CT HEAD WITHOUT CONTRAST
TECHNIQUE: Contiguous axial images were obtained from the base of the skull
through the vertex without intravenous contrast.

[Series 3: head without · axial · non-contrast · 0.45mm/px · z∈[-182,-62]mm · 7 of 34 slices shown, 9 images]
[im 5/34  brain]
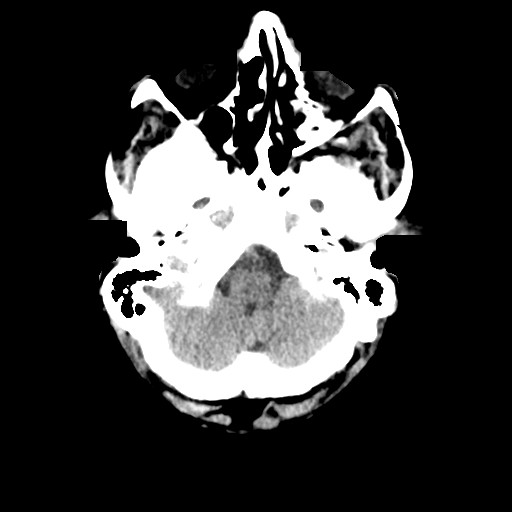
[im 5/34  bone]
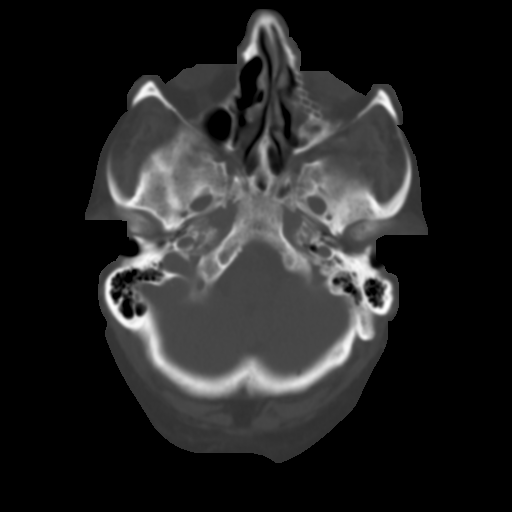
[im 9/34  brain]
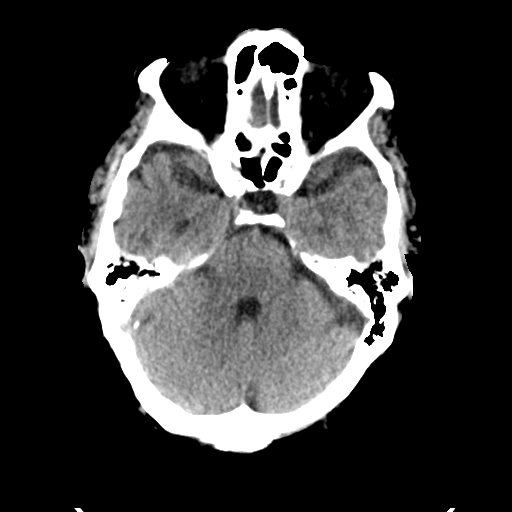
[im 13/34  brain]
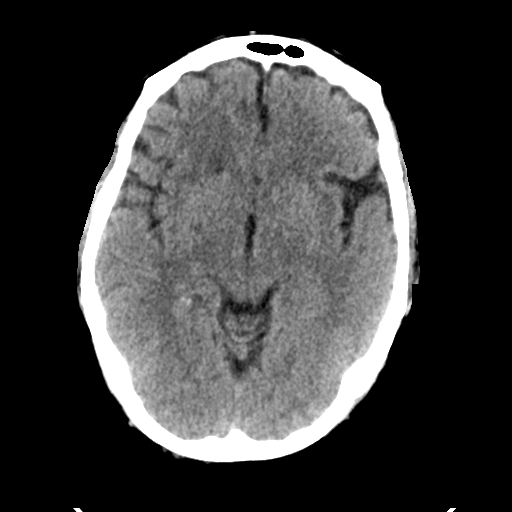
[im 17/34  brain]
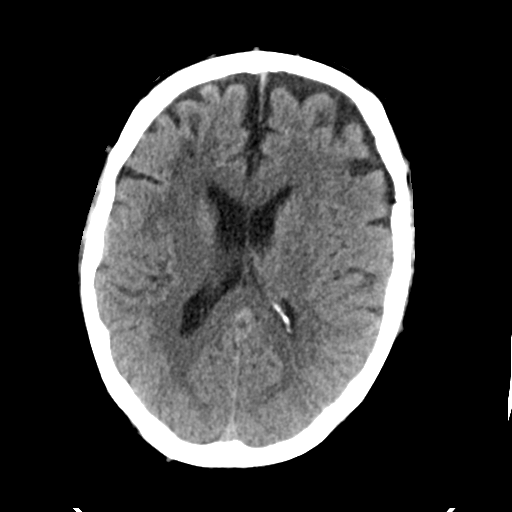
[im 21/34  brain]
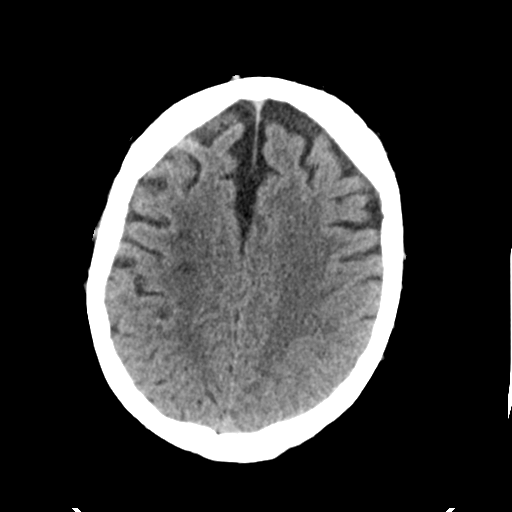
[im 21/34  bone]
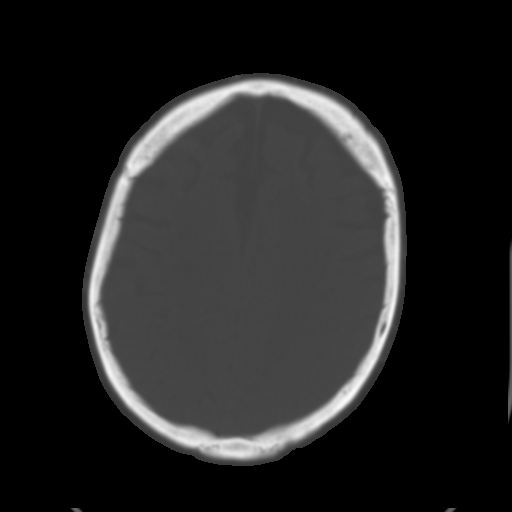
[im 25/34  brain]
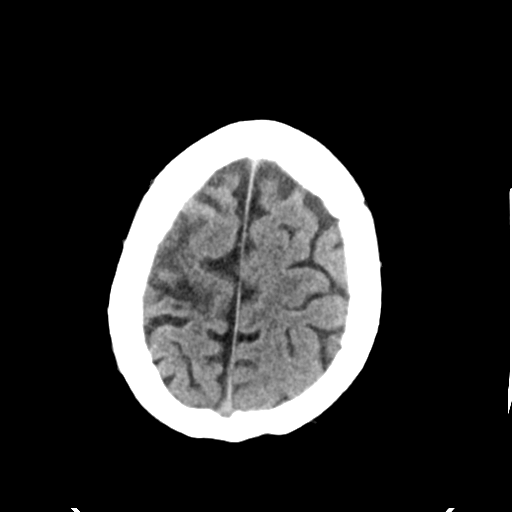
[im 29/34  brain]
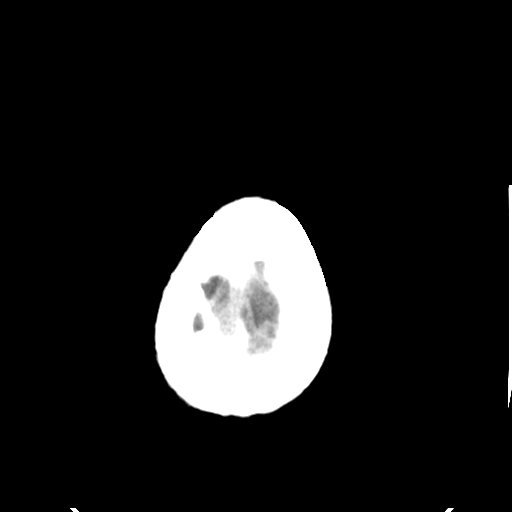

[Series 4: head bone · axial · 0.45mm/px · z∈[-186,-128]mm · 4 of 84 slices shown]
[im 9/84  bone]
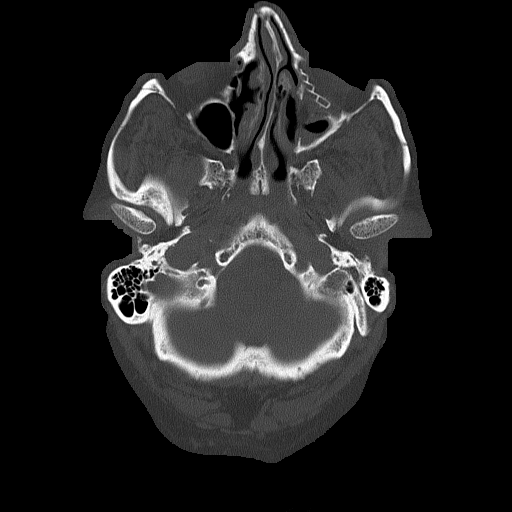
[im 17/84  bone]
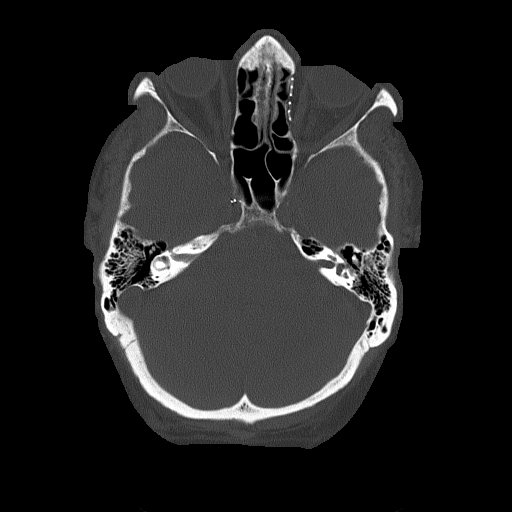
[im 25/84  bone]
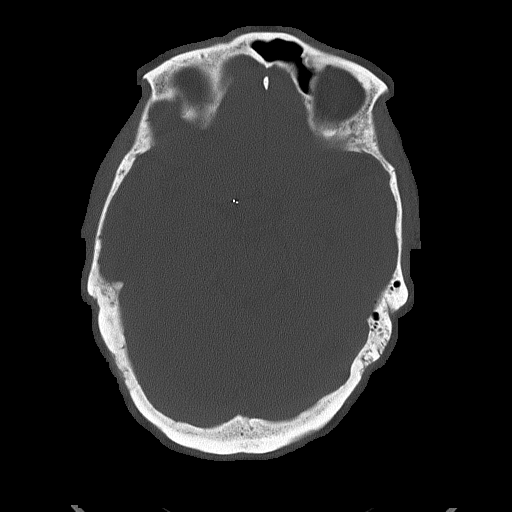
[im 38/84  bone]
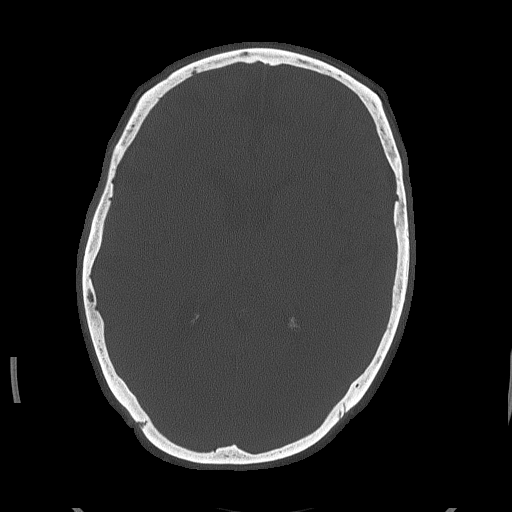

[Series 7: head without cor · coronal · non-contrast · 0.33mm/px · 3 of 69 slices shown]
[im 23/69  brain]
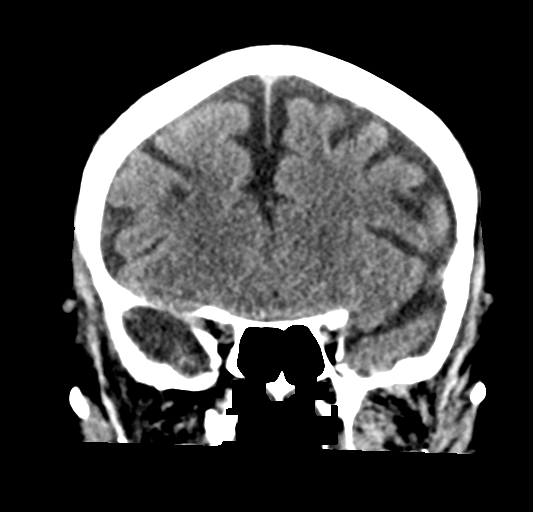
[im 31/69  brain]
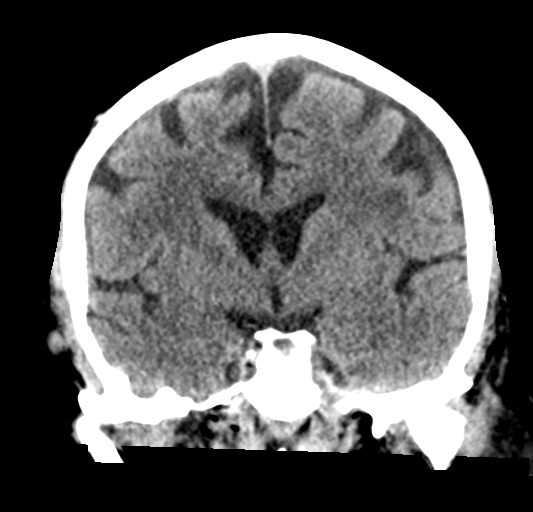
[im 38/69  brain]
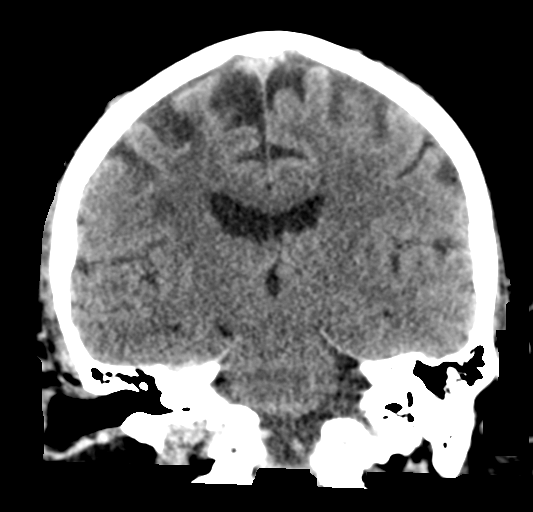

[Series 8: head without sag · sagittal · non-contrast · 0.33mm/px · 3 of 59 slices shown]
[im 20/59  brain]
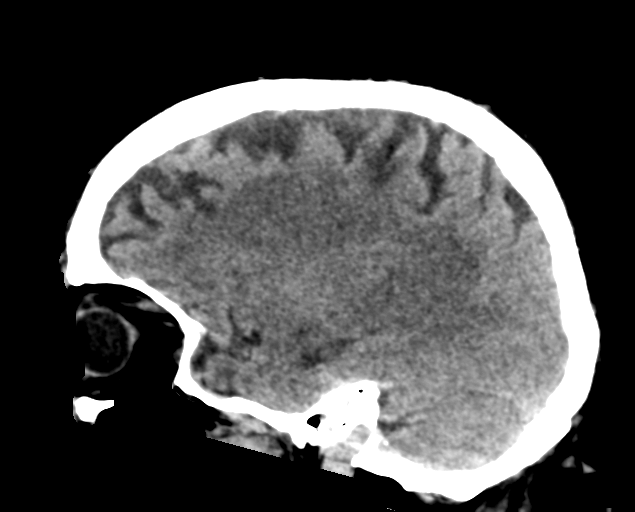
[im 30/59  brain]
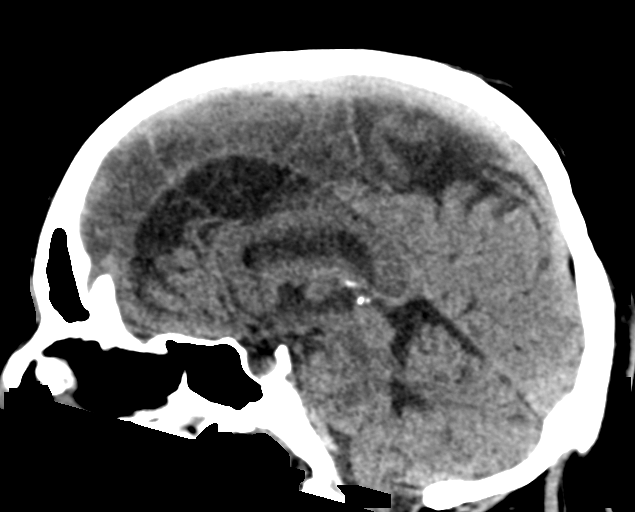
[im 39/59  brain]
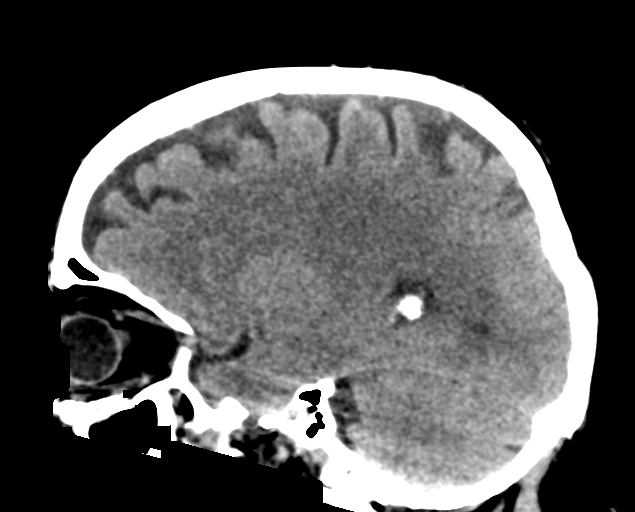

[17 of 47 positions shown; findings below may reference images not displayed]

FINDINGS: Brain: No evidence of acute infarction, hemorrhage, hydrocephalus,
extra-axial collection or mass lesion/mass effect. Expected
evolution of high posterior right frontal infarcts seen on
07/17/2016 brain MRI. There is patchy microvascular ischemic injury
in the cerebral white matter, overall mild.

Vascular: Atherosclerotic calcification. Right intracranial ICA
stent. No hyperdense vessel.

Skull: No acute finding

Sinuses/Orbits: No acute finding. Bilateral cataract resection and
left orbital floor repair with plate.
IMPRESSION: 1. No acute finding.
2. Known right posterior frontal infarct that occurred 07/17/2016.

## 2018-06-03 DIAGNOSIS — I10 Essential (primary) hypertension: Secondary | ICD-10-CM | POA: Diagnosis not present

## 2018-06-03 DIAGNOSIS — J449 Chronic obstructive pulmonary disease, unspecified: Secondary | ICD-10-CM | POA: Diagnosis not present

## 2018-06-03 DIAGNOSIS — I69354 Hemiplegia and hemiparesis following cerebral infarction affecting left non-dominant side: Secondary | ICD-10-CM | POA: Diagnosis not present

## 2018-06-08 DIAGNOSIS — Z9119 Patient's noncompliance with other medical treatment and regimen: Secondary | ICD-10-CM | POA: Diagnosis not present

## 2018-06-08 DIAGNOSIS — M6281 Muscle weakness (generalized): Secondary | ICD-10-CM | POA: Diagnosis not present

## 2018-06-10 DIAGNOSIS — G4701 Insomnia due to medical condition: Secondary | ICD-10-CM | POA: Diagnosis not present

## 2018-06-10 DIAGNOSIS — I509 Heart failure, unspecified: Secondary | ICD-10-CM | POA: Diagnosis not present

## 2018-06-10 DIAGNOSIS — E039 Hypothyroidism, unspecified: Secondary | ICD-10-CM | POA: Diagnosis not present

## 2018-06-10 DIAGNOSIS — E559 Vitamin D deficiency, unspecified: Secondary | ICD-10-CM | POA: Diagnosis not present

## 2018-06-10 DIAGNOSIS — E119 Type 2 diabetes mellitus without complications: Secondary | ICD-10-CM | POA: Diagnosis not present

## 2018-06-10 DIAGNOSIS — E785 Hyperlipidemia, unspecified: Secondary | ICD-10-CM | POA: Diagnosis not present

## 2018-06-13 DIAGNOSIS — R5381 Other malaise: Secondary | ICD-10-CM | POA: Diagnosis not present

## 2018-06-13 DIAGNOSIS — K5900 Constipation, unspecified: Secondary | ICD-10-CM | POA: Diagnosis not present

## 2018-06-13 DIAGNOSIS — M6281 Muscle weakness (generalized): Secondary | ICD-10-CM | POA: Diagnosis not present

## 2018-06-13 DIAGNOSIS — E559 Vitamin D deficiency, unspecified: Secondary | ICD-10-CM | POA: Diagnosis not present

## 2018-06-14 DIAGNOSIS — R21 Rash and other nonspecific skin eruption: Secondary | ICD-10-CM | POA: Diagnosis not present

## 2018-06-14 DIAGNOSIS — L299 Pruritus, unspecified: Secondary | ICD-10-CM | POA: Diagnosis not present

## 2018-06-15 DIAGNOSIS — M109 Gout, unspecified: Secondary | ICD-10-CM | POA: Diagnosis not present

## 2018-06-15 DIAGNOSIS — Z139 Encounter for screening, unspecified: Secondary | ICD-10-CM | POA: Diagnosis not present

## 2018-06-20 DIAGNOSIS — Z5181 Encounter for therapeutic drug level monitoring: Secondary | ICD-10-CM | POA: Diagnosis not present

## 2018-06-20 DIAGNOSIS — Z139 Encounter for screening, unspecified: Secondary | ICD-10-CM | POA: Diagnosis not present

## 2018-07-08 DIAGNOSIS — G4701 Insomnia due to medical condition: Secondary | ICD-10-CM | POA: Diagnosis not present

## 2018-07-22 DIAGNOSIS — M6281 Muscle weakness (generalized): Secondary | ICD-10-CM | POA: Diagnosis not present

## 2018-07-22 DIAGNOSIS — R5381 Other malaise: Secondary | ICD-10-CM | POA: Diagnosis not present

## 2018-07-22 DIAGNOSIS — I6930 Unspecified sequelae of cerebral infarction: Secondary | ICD-10-CM | POA: Diagnosis not present

## 2018-07-22 DIAGNOSIS — R21 Rash and other nonspecific skin eruption: Secondary | ICD-10-CM | POA: Diagnosis not present

## 2018-07-22 DIAGNOSIS — L299 Pruritus, unspecified: Secondary | ICD-10-CM | POA: Diagnosis not present

## 2018-07-27 DIAGNOSIS — L299 Pruritus, unspecified: Secondary | ICD-10-CM | POA: Diagnosis not present

## 2018-07-27 DIAGNOSIS — R21 Rash and other nonspecific skin eruption: Secondary | ICD-10-CM | POA: Diagnosis not present

## 2018-07-27 DIAGNOSIS — G4700 Insomnia, unspecified: Secondary | ICD-10-CM | POA: Diagnosis not present

## 2018-07-27 DIAGNOSIS — I6930 Unspecified sequelae of cerebral infarction: Secondary | ICD-10-CM | POA: Diagnosis not present

## 2018-07-28 DIAGNOSIS — R262 Difficulty in walking, not elsewhere classified: Secondary | ICD-10-CM | POA: Diagnosis not present

## 2018-07-28 DIAGNOSIS — I69354 Hemiplegia and hemiparesis following cerebral infarction affecting left non-dominant side: Secondary | ICD-10-CM | POA: Diagnosis not present

## 2018-07-29 DIAGNOSIS — I69354 Hemiplegia and hemiparesis following cerebral infarction affecting left non-dominant side: Secondary | ICD-10-CM | POA: Diagnosis not present

## 2018-07-29 DIAGNOSIS — R262 Difficulty in walking, not elsewhere classified: Secondary | ICD-10-CM | POA: Diagnosis not present

## 2018-07-29 DIAGNOSIS — L299 Pruritus, unspecified: Secondary | ICD-10-CM | POA: Diagnosis not present

## 2018-07-29 DIAGNOSIS — L853 Xerosis cutis: Secondary | ICD-10-CM | POA: Diagnosis not present

## 2018-08-01 DIAGNOSIS — R262 Difficulty in walking, not elsewhere classified: Secondary | ICD-10-CM | POA: Diagnosis not present

## 2018-08-01 DIAGNOSIS — I69354 Hemiplegia and hemiparesis following cerebral infarction affecting left non-dominant side: Secondary | ICD-10-CM | POA: Diagnosis not present

## 2018-08-02 DIAGNOSIS — G459 Transient cerebral ischemic attack, unspecified: Secondary | ICD-10-CM | POA: Diagnosis not present

## 2018-08-02 DIAGNOSIS — R262 Difficulty in walking, not elsewhere classified: Secondary | ICD-10-CM | POA: Diagnosis not present

## 2018-08-02 DIAGNOSIS — R5383 Other fatigue: Secondary | ICD-10-CM | POA: Diagnosis not present

## 2018-08-02 DIAGNOSIS — I69354 Hemiplegia and hemiparesis following cerebral infarction affecting left non-dominant side: Secondary | ICD-10-CM | POA: Diagnosis not present

## 2018-08-02 DIAGNOSIS — Z66 Do not resuscitate: Secondary | ICD-10-CM | POA: Diagnosis not present

## 2018-08-02 DIAGNOSIS — I6930 Unspecified sequelae of cerebral infarction: Secondary | ICD-10-CM | POA: Diagnosis not present

## 2018-08-03 DIAGNOSIS — R262 Difficulty in walking, not elsewhere classified: Secondary | ICD-10-CM | POA: Diagnosis not present

## 2018-08-03 DIAGNOSIS — G459 Transient cerebral ischemic attack, unspecified: Secondary | ICD-10-CM | POA: Diagnosis not present

## 2018-08-03 DIAGNOSIS — I6930 Unspecified sequelae of cerebral infarction: Secondary | ICD-10-CM | POA: Diagnosis not present

## 2018-08-03 DIAGNOSIS — R031 Nonspecific low blood-pressure reading: Secondary | ICD-10-CM | POA: Diagnosis not present

## 2018-08-03 DIAGNOSIS — I69354 Hemiplegia and hemiparesis following cerebral infarction affecting left non-dominant side: Secondary | ICD-10-CM | POA: Diagnosis not present

## 2018-08-04 DIAGNOSIS — R262 Difficulty in walking, not elsewhere classified: Secondary | ICD-10-CM | POA: Diagnosis not present

## 2018-08-04 DIAGNOSIS — I69354 Hemiplegia and hemiparesis following cerebral infarction affecting left non-dominant side: Secondary | ICD-10-CM | POA: Diagnosis not present

## 2018-08-05 DIAGNOSIS — R262 Difficulty in walking, not elsewhere classified: Secondary | ICD-10-CM | POA: Diagnosis not present

## 2018-08-05 DIAGNOSIS — G4701 Insomnia due to medical condition: Secondary | ICD-10-CM | POA: Diagnosis not present

## 2018-08-05 DIAGNOSIS — I69354 Hemiplegia and hemiparesis following cerebral infarction affecting left non-dominant side: Secondary | ICD-10-CM | POA: Diagnosis not present

## 2018-08-08 ENCOUNTER — Other Ambulatory Visit: Payer: Self-pay

## 2018-08-08 DIAGNOSIS — R262 Difficulty in walking, not elsewhere classified: Secondary | ICD-10-CM | POA: Diagnosis not present

## 2018-08-08 DIAGNOSIS — I69354 Hemiplegia and hemiparesis following cerebral infarction affecting left non-dominant side: Secondary | ICD-10-CM | POA: Diagnosis not present

## 2018-08-08 NOTE — Patient Outreach (Signed)
[facility]) Care Management  08/08/2018  MARLEN MOLLICA 09/10/1950 [id number]   Medication Adherence call to Mr. Nikita Humble HIPPA Compliant Voice message left with a call back number. Mr. Maser is showing past due on Losartan 25 mg and Atorvastatin 80 mg under [facility].  [facility] Direct Dial [phone]  Fax [phone] [email or web address]

## 2018-08-09 ENCOUNTER — Other Ambulatory Visit: Payer: Self-pay | Admitting: *Deleted

## 2018-08-09 DIAGNOSIS — R262 Difficulty in walking, not elsewhere classified: Secondary | ICD-10-CM | POA: Diagnosis not present

## 2018-08-09 DIAGNOSIS — I69354 Hemiplegia and hemiparesis following cerebral infarction affecting left non-dominant side: Secondary | ICD-10-CM | POA: Diagnosis not present

## 2018-08-09 NOTE — Patient Outreach (Signed)
[facility]) Care Management  08/09/2018  TERREN JANDREAU 02-25-1950 [id number]   RN Health Coach telephone call to [facility]. This is a patient in their facility. This patient has no further intervention needs he is is the Skilled Nursing division  of the facility.   Plan:  Closure consumer assessed and no intervention needed,  [facility] [phone]

## 2018-08-10 DIAGNOSIS — G8194 Hemiplegia, unspecified affecting left nondominant side: Secondary | ICD-10-CM | POA: Diagnosis not present

## 2018-08-10 DIAGNOSIS — R262 Difficulty in walking, not elsewhere classified: Secondary | ICD-10-CM | POA: Diagnosis not present

## 2018-08-10 DIAGNOSIS — I69354 Hemiplegia and hemiparesis following cerebral infarction affecting left non-dominant side: Secondary | ICD-10-CM | POA: Diagnosis not present

## 2018-08-17 DIAGNOSIS — R6883 Chills (without fever): Secondary | ICD-10-CM | POA: Diagnosis not present

## 2018-08-17 DIAGNOSIS — Z139 Encounter for screening, unspecified: Secondary | ICD-10-CM | POA: Diagnosis not present

## 2018-08-17 DIAGNOSIS — R0989 Other specified symptoms and signs involving the circulatory and respiratory systems: Secondary | ICD-10-CM | POA: Diagnosis not present

## 2018-08-17 DIAGNOSIS — I6930 Unspecified sequelae of cerebral infarction: Secondary | ICD-10-CM | POA: Diagnosis not present

## 2018-08-17 DIAGNOSIS — I10 Essential (primary) hypertension: Secondary | ICD-10-CM | POA: Diagnosis not present

## 2018-08-17 DIAGNOSIS — R062 Wheezing: Secondary | ICD-10-CM | POA: Diagnosis not present

## 2018-08-17 DIAGNOSIS — R0902 Hypoxemia: Secondary | ICD-10-CM | POA: Diagnosis not present

## 2018-08-17 DIAGNOSIS — R5383 Other fatigue: Secondary | ICD-10-CM | POA: Diagnosis not present

## 2018-08-17 DIAGNOSIS — R5381 Other malaise: Secondary | ICD-10-CM | POA: Diagnosis not present

## 2018-08-17 DIAGNOSIS — R231 Pallor: Secondary | ICD-10-CM | POA: Diagnosis not present

## 2018-08-18 DIAGNOSIS — D649 Anemia, unspecified: Secondary | ICD-10-CM | POA: Diagnosis not present

## 2018-08-18 DIAGNOSIS — R6883 Chills (without fever): Secondary | ICD-10-CM | POA: Diagnosis not present

## 2018-08-18 DIAGNOSIS — R5381 Other malaise: Secondary | ICD-10-CM | POA: Diagnosis not present

## 2018-08-18 DIAGNOSIS — R231 Pallor: Secondary | ICD-10-CM | POA: Diagnosis not present

## 2018-08-18 DIAGNOSIS — R0989 Other specified symptoms and signs involving the circulatory and respiratory systems: Secondary | ICD-10-CM | POA: Diagnosis not present

## 2018-08-19 DIAGNOSIS — I6930 Unspecified sequelae of cerebral infarction: Secondary | ICD-10-CM | POA: Diagnosis not present

## 2018-08-19 DIAGNOSIS — R0989 Other specified symptoms and signs involving the circulatory and respiratory systems: Secondary | ICD-10-CM | POA: Diagnosis not present

## 2018-08-19 DIAGNOSIS — R231 Pallor: Secondary | ICD-10-CM | POA: Diagnosis not present

## 2018-08-19 DIAGNOSIS — D649 Anemia, unspecified: Secondary | ICD-10-CM | POA: Diagnosis not present

## 2018-08-19 DIAGNOSIS — R5381 Other malaise: Secondary | ICD-10-CM | POA: Diagnosis not present

## 2018-08-22 ENCOUNTER — Emergency Department (HOSPITAL_COMMUNITY): Payer: Medicare Other

## 2018-08-22 ENCOUNTER — Encounter (HOSPITAL_COMMUNITY): Payer: Self-pay | Admitting: Radiology

## 2018-08-22 ENCOUNTER — Other Ambulatory Visit: Payer: Self-pay

## 2018-08-22 ENCOUNTER — Inpatient Hospital Stay (HOSPITAL_COMMUNITY)
Admission: EM | Admit: 2018-08-22 | Discharge: 2018-08-26 | DRG: 377 | Disposition: A | Discharge status: Skilled Nursing Facility | Payer: Medicare Other | Source: Skilled Nursing Facility | Attending: Family Medicine | Admitting: Family Medicine

## 2018-08-22 DIAGNOSIS — I63511 Cerebral infarction due to unspecified occlusion or stenosis of right middle cerebral artery: Secondary | ICD-10-CM | POA: Diagnosis not present

## 2018-08-22 DIAGNOSIS — D508 Other iron deficiency anemias: Secondary | ICD-10-CM

## 2018-08-22 DIAGNOSIS — R262 Difficulty in walking, not elsewhere classified: Secondary | ICD-10-CM | POA: Diagnosis not present

## 2018-08-22 DIAGNOSIS — K31811 Angiodysplasia of stomach and duodenum with bleeding: Principal | ICD-10-CM | POA: Diagnosis present

## 2018-08-22 DIAGNOSIS — I69354 Hemiplegia and hemiparesis following cerebral infarction affecting left non-dominant side: Secondary | ICD-10-CM | POA: Diagnosis not present

## 2018-08-22 DIAGNOSIS — Z95820 Peripheral vascular angioplasty status with implants and grafts: Secondary | ICD-10-CM | POA: Diagnosis not present

## 2018-08-22 DIAGNOSIS — K6289 Other specified diseases of anus and rectum: Secondary | ICD-10-CM | POA: Diagnosis not present

## 2018-08-22 DIAGNOSIS — R471 Dysarthria and anarthria: Secondary | ICD-10-CM | POA: Diagnosis not present

## 2018-08-22 DIAGNOSIS — E1142 Type 2 diabetes mellitus with diabetic polyneuropathy: Secondary | ICD-10-CM | POA: Diagnosis present

## 2018-08-22 DIAGNOSIS — Z7951 Long term (current) use of inhaled steroids: Secondary | ICD-10-CM

## 2018-08-22 DIAGNOSIS — M1000 Idiopathic gout, unspecified site: Secondary | ICD-10-CM | POA: Diagnosis not present

## 2018-08-22 DIAGNOSIS — F0280 Dementia in other diseases classified elsewhere without behavioral disturbance: Secondary | ICD-10-CM | POA: Diagnosis present

## 2018-08-22 DIAGNOSIS — R4781 Slurred speech: Secondary | ICD-10-CM | POA: Diagnosis not present

## 2018-08-22 DIAGNOSIS — M2550 Pain in unspecified joint: Secondary | ICD-10-CM | POA: Diagnosis not present

## 2018-08-22 DIAGNOSIS — D649 Anemia, unspecified: Secondary | ICD-10-CM | POA: Diagnosis present

## 2018-08-22 DIAGNOSIS — Z7902 Long term (current) use of antithrombotics/antiplatelets: Secondary | ICD-10-CM

## 2018-08-22 DIAGNOSIS — I5032 Chronic diastolic (congestive) heart failure: Secondary | ICD-10-CM | POA: Diagnosis present

## 2018-08-22 DIAGNOSIS — G8191 Hemiplegia, unspecified affecting right dominant side: Secondary | ICD-10-CM | POA: Diagnosis present

## 2018-08-22 DIAGNOSIS — R634 Abnormal weight loss: Secondary | ICD-10-CM | POA: Diagnosis present

## 2018-08-22 DIAGNOSIS — R2990 Unspecified symptoms and signs involving the nervous system: Secondary | ICD-10-CM | POA: Diagnosis not present

## 2018-08-22 DIAGNOSIS — Z96659 Presence of unspecified artificial knee joint: Secondary | ICD-10-CM | POA: Diagnosis present

## 2018-08-22 DIAGNOSIS — Z8249 Family history of ischemic heart disease and other diseases of the circulatory system: Secondary | ICD-10-CM

## 2018-08-22 DIAGNOSIS — Z20828 Contact with and (suspected) exposure to other viral communicable diseases: Secondary | ICD-10-CM | POA: Diagnosis not present

## 2018-08-22 DIAGNOSIS — R933 Abnormal findings on diagnostic imaging of other parts of digestive tract: Secondary | ICD-10-CM | POA: Diagnosis not present

## 2018-08-22 DIAGNOSIS — I6503 Occlusion and stenosis of bilateral vertebral arteries: Secondary | ICD-10-CM | POA: Diagnosis not present

## 2018-08-22 DIAGNOSIS — I6389 Other cerebral infarction: Secondary | ICD-10-CM | POA: Diagnosis present

## 2018-08-22 DIAGNOSIS — I2510 Atherosclerotic heart disease of native coronary artery without angina pectoris: Secondary | ICD-10-CM | POA: Diagnosis present

## 2018-08-22 DIAGNOSIS — R531 Weakness: Secondary | ICD-10-CM | POA: Diagnosis not present

## 2018-08-22 DIAGNOSIS — D62 Acute posthemorrhagic anemia: Secondary | ICD-10-CM | POA: Diagnosis present

## 2018-08-22 DIAGNOSIS — F329 Major depressive disorder, single episode, unspecified: Secondary | ICD-10-CM | POA: Diagnosis present

## 2018-08-22 DIAGNOSIS — I252 Old myocardial infarction: Secondary | ICD-10-CM

## 2018-08-22 DIAGNOSIS — I700 Atherosclerosis of aorta: Secondary | ICD-10-CM | POA: Diagnosis not present

## 2018-08-22 DIAGNOSIS — Z86718 Personal history of other venous thrombosis and embolism: Secondary | ICD-10-CM

## 2018-08-22 DIAGNOSIS — Z515 Encounter for palliative care: Secondary | ICD-10-CM | POA: Diagnosis not present

## 2018-08-22 DIAGNOSIS — G4733 Obstructive sleep apnea (adult) (pediatric): Secondary | ICD-10-CM | POA: Diagnosis present

## 2018-08-22 DIAGNOSIS — R32 Unspecified urinary incontinence: Secondary | ICD-10-CM | POA: Diagnosis present

## 2018-08-22 DIAGNOSIS — K31819 Angiodysplasia of stomach and duodenum without bleeding: Secondary | ICD-10-CM | POA: Diagnosis not present

## 2018-08-22 DIAGNOSIS — Z888 Allergy status to other drugs, medicaments and biological substances status: Secondary | ICD-10-CM

## 2018-08-22 DIAGNOSIS — C3411 Malignant neoplasm of upper lobe, right bronchus or lung: Secondary | ICD-10-CM | POA: Diagnosis present

## 2018-08-22 DIAGNOSIS — R918 Other nonspecific abnormal finding of lung field: Secondary | ICD-10-CM | POA: Diagnosis not present

## 2018-08-22 DIAGNOSIS — I959 Hypotension, unspecified: Secondary | ICD-10-CM | POA: Diagnosis present

## 2018-08-22 DIAGNOSIS — K219 Gastro-esophageal reflux disease without esophagitis: Secondary | ICD-10-CM | POA: Diagnosis present

## 2018-08-22 DIAGNOSIS — G40909 Epilepsy, unspecified, not intractable, without status epilepticus: Secondary | ICD-10-CM | POA: Diagnosis present

## 2018-08-22 DIAGNOSIS — R159 Full incontinence of feces: Secondary | ICD-10-CM | POA: Diagnosis present

## 2018-08-22 DIAGNOSIS — I69351 Hemiplegia and hemiparesis following cerebral infarction affecting right dominant side: Secondary | ICD-10-CM | POA: Diagnosis not present

## 2018-08-22 DIAGNOSIS — R195 Other fecal abnormalities: Secondary | ICD-10-CM | POA: Diagnosis not present

## 2018-08-22 DIAGNOSIS — K922 Gastrointestinal hemorrhage, unspecified: Secondary | ICD-10-CM | POA: Diagnosis not present

## 2018-08-22 DIAGNOSIS — I6930 Unspecified sequelae of cerebral infarction: Secondary | ICD-10-CM | POA: Diagnosis not present

## 2018-08-22 DIAGNOSIS — R9389 Abnormal findings on diagnostic imaging of other specified body structures: Secondary | ICD-10-CM | POA: Diagnosis not present

## 2018-08-22 DIAGNOSIS — Z7982 Long term (current) use of aspirin: Secondary | ICD-10-CM

## 2018-08-22 DIAGNOSIS — I10 Essential (primary) hypertension: Secondary | ICD-10-CM | POA: Diagnosis not present

## 2018-08-22 DIAGNOSIS — E1151 Type 2 diabetes mellitus with diabetic peripheral angiopathy without gangrene: Secondary | ICD-10-CM | POA: Diagnosis not present

## 2018-08-22 DIAGNOSIS — I4891 Unspecified atrial fibrillation: Secondary | ICD-10-CM | POA: Diagnosis not present

## 2018-08-22 DIAGNOSIS — Z7401 Bed confinement status: Secondary | ICD-10-CM | POA: Diagnosis not present

## 2018-08-22 DIAGNOSIS — M109 Gout, unspecified: Secondary | ICD-10-CM | POA: Diagnosis present

## 2018-08-22 DIAGNOSIS — I313 Pericardial effusion (noninflammatory): Secondary | ICD-10-CM | POA: Diagnosis not present

## 2018-08-22 DIAGNOSIS — Z8673 Personal history of transient ischemic attack (TIA), and cerebral infarction without residual deficits: Secondary | ICD-10-CM | POA: Diagnosis not present

## 2018-08-22 DIAGNOSIS — M6281 Muscle weakness (generalized): Secondary | ICD-10-CM | POA: Diagnosis not present

## 2018-08-22 DIAGNOSIS — D500 Iron deficiency anemia secondary to blood loss (chronic): Secondary | ICD-10-CM | POA: Diagnosis not present

## 2018-08-22 DIAGNOSIS — R29810 Facial weakness: Secondary | ICD-10-CM | POA: Diagnosis not present

## 2018-08-22 DIAGNOSIS — K635 Polyp of colon: Secondary | ICD-10-CM | POA: Diagnosis not present

## 2018-08-22 DIAGNOSIS — R29709 NIHSS score 9: Secondary | ICD-10-CM | POA: Diagnosis present

## 2018-08-22 DIAGNOSIS — F17210 Nicotine dependence, cigarettes, uncomplicated: Secondary | ICD-10-CM | POA: Diagnosis present

## 2018-08-22 DIAGNOSIS — R911 Solitary pulmonary nodule: Secondary | ICD-10-CM | POA: Diagnosis not present

## 2018-08-22 DIAGNOSIS — R799 Abnormal finding of blood chemistry, unspecified: Secondary | ICD-10-CM | POA: Diagnosis not present

## 2018-08-22 DIAGNOSIS — E876 Hypokalemia: Secondary | ICD-10-CM | POA: Diagnosis present

## 2018-08-22 DIAGNOSIS — J449 Chronic obstructive pulmonary disease, unspecified: Secondary | ICD-10-CM | POA: Diagnosis present

## 2018-08-22 DIAGNOSIS — Z79899 Other long term (current) drug therapy: Secondary | ICD-10-CM | POA: Diagnosis not present

## 2018-08-22 DIAGNOSIS — Z885 Allergy status to narcotic agent status: Secondary | ICD-10-CM

## 2018-08-22 DIAGNOSIS — K5900 Constipation, unspecified: Secondary | ICD-10-CM | POA: Diagnosis present

## 2018-08-22 DIAGNOSIS — G8194 Hemiplegia, unspecified affecting left nondominant side: Secondary | ICD-10-CM | POA: Diagnosis not present

## 2018-08-22 DIAGNOSIS — E785 Hyperlipidemia, unspecified: Secondary | ICD-10-CM | POA: Diagnosis present

## 2018-08-22 DIAGNOSIS — G2581 Restless legs syndrome: Secondary | ICD-10-CM | POA: Diagnosis not present

## 2018-08-22 DIAGNOSIS — I110 Hypertensive heart disease with heart failure: Secondary | ICD-10-CM | POA: Diagnosis present

## 2018-08-22 DIAGNOSIS — F1490 Cocaine use, unspecified, uncomplicated: Secondary | ICD-10-CM | POA: Diagnosis present

## 2018-08-22 DIAGNOSIS — R05 Cough: Secondary | ICD-10-CM | POA: Diagnosis not present

## 2018-08-22 DIAGNOSIS — Z66 Do not resuscitate: Secondary | ICD-10-CM | POA: Diagnosis not present

## 2018-08-22 DIAGNOSIS — Z7189 Other specified counseling: Secondary | ICD-10-CM | POA: Diagnosis not present

## 2018-08-22 DIAGNOSIS — Z886 Allergy status to analgesic agent status: Secondary | ICD-10-CM

## 2018-08-22 DIAGNOSIS — I639 Cerebral infarction, unspecified: Secondary | ICD-10-CM | POA: Diagnosis not present

## 2018-08-22 DIAGNOSIS — D124 Benign neoplasm of descending colon: Secondary | ICD-10-CM | POA: Diagnosis not present

## 2018-08-22 DIAGNOSIS — K2970 Gastritis, unspecified, without bleeding: Secondary | ICD-10-CM | POA: Diagnosis not present

## 2018-08-22 DIAGNOSIS — I69392 Facial weakness following cerebral infarction: Secondary | ICD-10-CM

## 2018-08-22 DIAGNOSIS — K621 Rectal polyp: Secondary | ICD-10-CM | POA: Diagnosis not present

## 2018-08-22 DIAGNOSIS — K648 Other hemorrhoids: Secondary | ICD-10-CM | POA: Diagnosis present

## 2018-08-22 DIAGNOSIS — R509 Fever, unspecified: Secondary | ICD-10-CM | POA: Diagnosis not present

## 2018-08-22 DIAGNOSIS — E870 Hyperosmolality and hypernatremia: Secondary | ICD-10-CM | POA: Diagnosis not present

## 2018-08-22 DIAGNOSIS — G479 Sleep disorder, unspecified: Secondary | ICD-10-CM | POA: Diagnosis not present

## 2018-08-22 DIAGNOSIS — I6523 Occlusion and stenosis of bilateral carotid arteries: Secondary | ICD-10-CM | POA: Diagnosis not present

## 2018-08-22 DIAGNOSIS — K2971 Gastritis, unspecified, with bleeding: Secondary | ICD-10-CM | POA: Diagnosis not present

## 2018-08-22 DIAGNOSIS — I5033 Acute on chronic diastolic (congestive) heart failure: Secondary | ICD-10-CM | POA: Diagnosis not present

## 2018-08-22 DIAGNOSIS — D509 Iron deficiency anemia, unspecified: Secondary | ICD-10-CM

## 2018-08-22 DIAGNOSIS — L299 Pruritus, unspecified: Secondary | ICD-10-CM | POA: Diagnosis not present

## 2018-08-22 DIAGNOSIS — Z6829 Body mass index (BMI) 29.0-29.9, adult: Secondary | ICD-10-CM

## 2018-08-22 DIAGNOSIS — R5381 Other malaise: Secondary | ICD-10-CM | POA: Diagnosis not present

## 2018-08-22 DIAGNOSIS — D128 Benign neoplasm of rectum: Secondary | ICD-10-CM | POA: Diagnosis not present

## 2018-08-22 DIAGNOSIS — I672 Cerebral atherosclerosis: Secondary | ICD-10-CM | POA: Diagnosis not present

## 2018-08-22 DIAGNOSIS — K2950 Unspecified chronic gastritis without bleeding: Secondary | ICD-10-CM | POA: Diagnosis not present

## 2018-08-22 DIAGNOSIS — I480 Paroxysmal atrial fibrillation: Secondary | ICD-10-CM | POA: Diagnosis present

## 2018-08-22 DIAGNOSIS — Z801 Family history of malignant neoplasm of trachea, bronchus and lung: Secondary | ICD-10-CM

## 2018-08-22 DIAGNOSIS — R070 Pain in throat: Secondary | ICD-10-CM | POA: Diagnosis present

## 2018-08-22 LAB — URINALYSIS, ROUTINE W REFLEX MICROSCOPIC
Bacteria, UA: NONE SEEN
Bilirubin Urine: NEGATIVE
Glucose, UA: NEGATIVE mg/dL
Hgb urine dipstick: NEGATIVE
Ketones, ur: NEGATIVE mg/dL
Leukocytes,Ua: NEGATIVE
Nitrite: NEGATIVE
Protein, ur: 30 mg/dL — AB
Specific Gravity, Urine: 1.044 — ABNORMAL HIGH (ref 1.005–1.030)
pH: 5 (ref 5.0–8.0)

## 2018-08-22 LAB — CBC
HCT: 22.9 % — ABNORMAL LOW (ref 39.0–52.0)
Hemoglobin: 5.9 g/dL — CL (ref 13.0–17.0)
MCH: 19.2 pg — ABNORMAL LOW (ref 26.0–34.0)
MCHC: 25.8 g/dL — ABNORMAL LOW (ref 30.0–36.0)
MCV: 74.6 fL — ABNORMAL LOW (ref 80.0–100.0)
Platelets: 214 10*3/uL (ref 150–400)
RBC: 3.07 MIL/uL — ABNORMAL LOW (ref 4.22–5.81)
RDW: 17.7 % — ABNORMAL HIGH (ref 11.5–15.5)
WBC: 5.9 10*3/uL (ref 4.0–10.5)
nRBC: 0.3 % — ABNORMAL HIGH (ref 0.0–0.2)

## 2018-08-22 LAB — COMPREHENSIVE METABOLIC PANEL
ALT: 11 U/L (ref 0–44)
AST: 15 U/L (ref 15–41)
Albumin: 3.2 g/dL — ABNORMAL LOW (ref 3.5–5.0)
Alkaline Phosphatase: 67 U/L (ref 38–126)
Anion gap: 9 (ref 5–15)
BUN: 8 mg/dL (ref 8–23)
CO2: 22 mmol/L (ref 22–32)
Calcium: 9.4 mg/dL (ref 8.9–10.3)
Chloride: 113 mmol/L — ABNORMAL HIGH (ref 98–111)
Creatinine, Ser: 0.84 mg/dL (ref 0.61–1.24)
GFR calc Af Amer: >60 mL/min (ref >60)
GFR calc non Af Amer: >60 mL/min (ref >60)
Glucose, Bld: 189 mg/dL — ABNORMAL HIGH (ref 70–99)
Potassium: 3.2 mmol/L — ABNORMAL LOW (ref 3.5–5.1)
Sodium: 144 mmol/L (ref 135–145)
Total Bilirubin: 0.8 mg/dL (ref 0.3–1.2)
Total Protein: 5.9 g/dL — ABNORMAL LOW (ref 6.5–8.1)

## 2018-08-22 LAB — RETICULOCYTES
Immature Retic Fract: 28.6 % — ABNORMAL HIGH (ref 2.3–15.9)
RBC.: 3.06 MIL/uL — ABNORMAL LOW (ref 4.22–5.81)
Retic Count, Absolute: 78.9 10*3/uL (ref 19.0–186.0)
Retic Ct Pct: 2.6 % (ref 0.4–3.1)

## 2018-08-22 LAB — CBG MONITORING, ED
Glucose-Capillary: 167 mg/dL — ABNORMAL HIGH (ref 70–99)
Glucose-Capillary: 95 mg/dL (ref 70–99)

## 2018-08-22 LAB — RAPID URINE DRUG SCREEN, HOSP PERFORMED
Amphetamines: NOT DETECTED
Barbiturates: NOT DETECTED
Benzodiazepines: NOT DETECTED
Cocaine: NOT DETECTED
Opiates: NOT DETECTED
Tetrahydrocannabinol: NOT DETECTED

## 2018-08-22 LAB — I-STAT CHEM 8, ED
BUN: 6 mg/dL — ABNORMAL LOW (ref 8–23)
Calcium, Ion: 1.1 mmol/L — ABNORMAL LOW (ref 1.15–1.40)
Chloride: 110 mmol/L (ref 98–111)
Creatinine, Ser: 0.7 mg/dL (ref 0.61–1.24)
Glucose, Bld: 183 mg/dL — ABNORMAL HIGH (ref 70–99)
HCT: 20 % — ABNORMAL LOW (ref 39.0–52.0)
Hemoglobin: 6.8 g/dL — CL (ref 13.0–17.0)
Potassium: 3.2 mmol/L — ABNORMAL LOW (ref 3.5–5.1)
Sodium: 145 mmol/L (ref 135–145)
TCO2: 25 mmol/L (ref 22–32)

## 2018-08-22 LAB — VITAMIN B12: Vitamin B-12: 323 pg/mL (ref 180–914)

## 2018-08-22 LAB — DIFFERENTIAL
Abs Immature Granulocytes: 0.04 10*3/uL (ref 0.00–0.07)
Basophils Absolute: 0.1 10*3/uL (ref 0.0–0.1)
Basophils Relative: 1 %
Eosinophils Absolute: 0.4 10*3/uL (ref 0.0–0.5)
Eosinophils Relative: 7 %
Immature Granulocytes: 1 %
Lymphocytes Relative: 21 %
Lymphs Abs: 1.2 10*3/uL (ref 0.7–4.0)
Monocytes Absolute: 0.7 10*3/uL (ref 0.1–1.0)
Monocytes Relative: 12 %
Neutro Abs: 3.5 10*3/uL (ref 1.7–7.7)
Neutrophils Relative %: 58 %

## 2018-08-22 LAB — IRON AND TIBC
Iron: 114 ug/dL (ref 45–182)
Saturation Ratios: 29 % (ref 17.9–39.5)
TIBC: 388 ug/dL (ref 250–450)
UIBC: 274 ug/dL

## 2018-08-22 LAB — FOLATE: Folate: 48 ng/mL (ref >5.9)

## 2018-08-22 LAB — APTT: aPTT: 29 seconds (ref 24–36)

## 2018-08-22 LAB — POC OCCULT BLOOD, ED: Fecal Occult Bld: POSITIVE — AB

## 2018-08-22 LAB — PROTIME-INR
INR: 1.2 (ref 0.8–1.2)
Prothrombin Time: 14.9 seconds (ref 11.4–15.2)

## 2018-08-22 LAB — SARS CORONAVIRUS 2 BY RT PCR (HOSPITAL ORDER, PERFORMED IN ~~LOC~~ HOSPITAL LAB): SARS Coronavirus 2: NEGATIVE

## 2018-08-22 LAB — ETHANOL: Alcohol, Ethyl (B): <10 mg/dL (ref <10)

## 2018-08-22 LAB — FERRITIN: Ferritin: 8 ng/mL — ABNORMAL LOW (ref 24–336)

## 2018-08-22 LAB — POC URINE PREG, ED: Preg Test, Ur: POSITIVE — AB

## 2018-08-22 LAB — PREPARE RBC (CROSSMATCH)

## 2018-08-22 MED ORDER — DULOXETINE HCL 60 MG PO CPEP
60.0000 mg | ORAL_CAPSULE | Freq: Every day | ORAL | Status: DC
  Administered 2018-08-23 – 2018-08-26 (×4): 60 mg via ORAL
  Filled 2018-08-22 (×4): qty 1

## 2018-08-22 MED ORDER — SODIUM CHLORIDE 0.9 % IV SOLN
8.0000 mg/h | INTRAVENOUS | Status: DC
  Administered 2018-08-22 – 2018-08-23 (×3): 8 mg/h via INTRAVENOUS
  Filled 2018-08-22 (×5): qty 80

## 2018-08-22 MED ORDER — MELATONIN 3 MG PO TABS
6.0000 mg | ORAL_TABLET | Freq: Every day | ORAL | Status: DC
  Administered 2018-08-22 – 2018-08-25 (×4): 6 mg via ORAL
  Filled 2018-08-22 (×5): qty 2

## 2018-08-22 MED ORDER — IOHEXOL 350 MG/ML SOLN
100.0000 mL | Freq: Once | INTRAVENOUS | Status: AC | PRN
  Administered 2018-08-22: 90 mL via INTRAVENOUS

## 2018-08-22 MED ORDER — MOMETASONE FURO-FORMOTEROL FUM 200-5 MCG/ACT IN AERO
2.0000 | INHALATION_SPRAY | Freq: Two times a day (BID) | RESPIRATORY_TRACT | Status: DC
  Administered 2018-08-23 – 2018-08-26 (×7): 2 via RESPIRATORY_TRACT
  Filled 2018-08-22: qty 8.8

## 2018-08-22 MED ORDER — CAMPHOR-MENTHOL 0.5-0.5 % EX LOTN
TOPICAL_LOTION | CUTANEOUS | Status: DC | PRN
  Filled 2018-08-22: qty 222

## 2018-08-22 MED ORDER — HYDROCERIN EX CREA
1.0000 "application " | TOPICAL_CREAM | Freq: Every day | CUTANEOUS | Status: DC
  Administered 2018-08-23 – 2018-08-26 (×4): 1 via TOPICAL
  Filled 2018-08-22: qty 113

## 2018-08-22 MED ORDER — MEMANTINE HCL 10 MG PO TABS
10.0000 mg | ORAL_TABLET | Freq: Two times a day (BID) | ORAL | Status: DC
  Administered 2018-08-22 – 2018-08-26 (×8): 10 mg via ORAL
  Filled 2018-08-22 (×9): qty 1

## 2018-08-22 MED ORDER — HYDROCORTISONE 1 % EX CREA
1.0000 "application " | TOPICAL_CREAM | Freq: Four times a day (QID) | CUTANEOUS | Status: DC | PRN
  Filled 2018-08-22: qty 28

## 2018-08-22 MED ORDER — GUAIFENESIN ER 600 MG PO TB12
1200.0000 mg | ORAL_TABLET | Freq: Two times a day (BID) | ORAL | Status: DC
  Administered 2018-08-22 – 2018-08-26 (×8): 1200 mg via ORAL
  Filled 2018-08-22 (×8): qty 2

## 2018-08-22 MED ORDER — FOLIC ACID 1 MG PO TABS
1.0000 mg | ORAL_TABLET | Freq: Every day | ORAL | Status: DC
  Administered 2018-08-23 – 2018-08-26 (×4): 1 mg via ORAL
  Filled 2018-08-22 (×4): qty 1

## 2018-08-22 MED ORDER — ATORVASTATIN CALCIUM 80 MG PO TABS
80.0000 mg | ORAL_TABLET | Freq: Every day | ORAL | Status: DC
  Administered 2018-08-23 – 2018-08-26 (×4): 80 mg via ORAL
  Filled 2018-08-22 (×4): qty 1

## 2018-08-22 MED ORDER — PREGABALIN 75 MG PO CAPS
150.0000 mg | ORAL_CAPSULE | Freq: Two times a day (BID) | ORAL | Status: DC
  Administered 2018-08-22 – 2018-08-26 (×8): 150 mg via ORAL
  Filled 2018-08-22 (×8): qty 2

## 2018-08-22 MED ORDER — SODIUM CHLORIDE 0.9 % IV SOLN
80.0000 mg | Freq: Once | INTRAVENOUS | Status: AC
  Administered 2018-08-22: 80 mg via INTRAVENOUS
  Filled 2018-08-22: qty 80

## 2018-08-22 MED ORDER — DERMACLOUD EX CREA: 1.0000 "application " | TOPICAL_CREAM | Freq: Two times a day (BID) | CUTANEOUS | Status: DC

## 2018-08-22 MED ORDER — MOMETASONE FURO-FORMOTEROL FUM 200-5 MCG/ACT IN AERO
2.0000 | INHALATION_SPRAY | Freq: Two times a day (BID) | RESPIRATORY_TRACT | Status: DC
  Filled 2018-08-22: qty 8.8

## 2018-08-22 MED ORDER — ALLOPURINOL 100 MG PO TABS
300.0000 mg | ORAL_TABLET | Freq: Every day | ORAL | Status: DC
  Administered 2018-08-23 – 2018-08-26 (×4): 300 mg via ORAL
  Filled 2018-08-22 (×4): qty 3

## 2018-08-22 MED ORDER — LEVETIRACETAM 500 MG PO TABS
500.0000 mg | ORAL_TABLET | Freq: Two times a day (BID) | ORAL | Status: DC
  Administered 2018-08-22 – 2018-08-26 (×8): 500 mg via ORAL
  Filled 2018-08-22 (×8): qty 1

## 2018-08-22 MED ORDER — VITAMIN D 25 MCG (1000 UNIT) PO TABS
1000.0000 [IU] | ORAL_TABLET | Freq: Every day | ORAL | Status: DC
  Administered 2018-08-23 – 2018-08-26 (×4): 1000 [IU] via ORAL
  Filled 2018-08-22 (×4): qty 1

## 2018-08-22 MED ORDER — TIZANIDINE HCL 4 MG PO TABS
2.0000 mg | ORAL_TABLET | Freq: Three times a day (TID) | ORAL | Status: DC
  Administered 2018-08-22 – 2018-08-26 (×11): 2 mg via ORAL
  Filled 2018-08-22 (×11): qty 1

## 2018-08-22 MED ORDER — TRAZODONE HCL 100 MG PO TABS
100.0000 mg | ORAL_TABLET | Freq: Every day | ORAL | Status: DC
  Administered 2018-08-22 – 2018-08-25 (×4): 100 mg via ORAL
  Filled 2018-08-22 (×4): qty 1

## 2018-08-22 MED ORDER — SODIUM CHLORIDE 0.9 % IV SOLN
10.0000 mL/h | Freq: Once | INTRAVENOUS | Status: AC
  Administered 2018-08-22: 10 mL/h via INTRAVENOUS

## 2018-08-22 NOTE — H&P (Addendum)
[facility] Admission History and Physical Service Pager: [phone]  Patient name: [name] Medical record number: [id number] Date of birth: [birth year] Age: 68 y.o. Gender: male  Primary Care Provider: Charlott Rakes, MD Consultants: GI Code Status: DNR (DNR form provided on admission) Preferred Emergency Contact: Eulogio Bear (former aide) [phone]  Chief Complaint: Weakness  Assessment and Plan: [name] is a 68 y.o. male presenting with symptomatic anemia . PMH is significant for COPD, GERD, HTN, HLD, T2DM, Prior stroke causing weakness/hemiparysis of left side.  Symptomatic anemia 2/2 GI bleed Patient from [facility] with weakness and pale complexion.  Vitals on admission were significant for BP in range of 16-606 systolic over 00K-59X diastolic. Pulse ranged in the 90s. Labs on admission showed K+ of 3.2, albumin of 3.2 and a hemoglobin of 5.9. Iron studies showed iron wnl, low ferritin. Stool guiac was positive for blood. UA was negative for leukocytes or nitrite. Patient is notably feeling better since initial presentation, had since received IV fluids. Initial concern for stroke process given report of R sided weakness but then found to be more generalized. Head CT showed no acute abnormality but a chronic right MCA infarct. CTA neck showed no large or medium vessel occlusion identified acutely but did show decreased flow volumes. Neurology consulted in the ED who felt decrease cerebral perfusion 2/2 anemia from acute GI bleed. Etiology - Given positive guiac testing there is high probability of gastrointestinal cause of his anemia. Patient has not noticed visible blood in his stool or urine but also states he does not have good vision and doesn't really look after a bowel movement. Is incontinent of bowel and bladder at baseline, wears diapers. UA negative for blood. He also has not had a colonoscopy in 30 years, unclear  indication at that time. Per chart review, pt has refused screening colonoscopy since 2016. - Admit to care of Dr. Owens Shark, med-surg - continue transfusing 2u PRBC, Monitor H&H post transfusion - Monitor vitals - GI consulted in ED - Dr. Tarri Glenn with [facility] to see - appreciate recommendations - SCDs  - continue protonix gtt - am CBC/BMP - NPO  Chronic Diastolic CHF Previous echo in Feb 7741 - normal systolic fx with 42-39% ejection fraction with impaired relaxation.  - Monitor fluid status  CAD, PVD With remote h/o central burning chest pain a few weeks ago, self resolved, no longer has pain. Atypical for ACS. EKG reviewed without ST changes.  - no current indication for cardiac w/u - monitor, continue home lipitor. Restart losartan as BP tolerates.  Dementia Patient on Namenda at home. Per chart review also has dementia-related depression, takes cymbalta - Continue home meds  Previous stroke/Hemiparesis of left side Patient with prior stroke in 2017 that lead to weakness in his left side. R sided dominance. - Per facility med list he continues on plavix, although based on current guidelines he could likely discontinue it at this time as it is after 90 days. - hold ASA, plavix - continue lipitor  HTN Normotensive on admission. Home medication - losartan - Hold home Losartan. Can likely restart tomorrow after transfusion if BP tolerates. - Monitor blood pressures  HLD Home medications include lipitor  - Continue lipitor  T2DM - diet controlled Last A1C 5.7 in February 2020, 6.2 in 2018 - Monitor with BMP  COPD with lung nodule c/f malignancy Patient states he normally does not require oxygen, able to wean to RA at the time of exam  and maintained sats. Denies shortness of breath. Significant smoking hx. On Dulera as home med. Follows with Oncology outpatient for 2cm lung nodule discovered 03/2018 when hospitalized for stroke, previously 0.9cm 01/2018. Last visit with Onc  04/2018 with plans for outpatient PET scan and biopsy of nodule, however patient cancelled per chart review. Cough present on exam although patient denies change from chronic cough. Does endorse chills, nausea, generalized fatigue but afebrile, COVID negative. Per chart review, pt mentioned about 50lb weight loss in 6 mths at [facility] visit. - Monitor respiratory status   - Continue Dulera  GERD: Home medications include protonix -Continue protonix gtt  Hx of seizure disorder: on keppra at home. Unknown last seizure. Continue home keppra  H/o polysubstance use Prior h/o extensive tobacco use (2-3 packs of cigars per day for 60 years). Prior alcohol use, last drink February 2020. Last had crack cocaine in February 2020. - monitor  H/o DVT Very limited mobility. No signs of DVT on exam. Will monitor closely as pt not on anticoagulation d/t acute bleed.  FEN/GI: NPO, Sips with meds Prophylaxis: SCDs  Disposition: Admit to med tele, attending Dr. Owens Shark.  History of Present Illness:  [name] is a 68 y.o. male presenting with increased generalized weakness.  Patient presents from [facility] after staff noticed an increase in weakness and the patient appearing more pale. They apparently noted a low hemoglobin but patient did not remember what it was. Patient was found to have a hemoglobin of 5.9 in the ED. Patient denies any current increased dizziness, lightheadedness, pain but does state he thinks he may have been dizzy earlier today. He had a prior stroke in 2016 that left him with left upper and lower limb weakness and he endorses that weakness to be progressive since February. He also admits to increased fatigue over the past few months. He denies loss of appetite as well as noticing any blood in stool. He does state that he has had bowel movements alternating between loose stools and constipation, and states this has been ongoing for the past 2-3 years. He states he has not had  a colonoscopy in 30 years. He denies a family history of colon cancer but does state he has right lung cancer. Patient also denies dysuria but says he is unsure if he has had increased frequency as he has difficulties with sensation. He admits to having chronic leg swelling which has not worsened recently. He also endorses some subjective fevers, most recent being last week during which time he has also had worsened cough that was productive of phlegm. He denies congestion but did have sore throat last week. Has been taking mucinex without relief. He has no known sick contacts. He also admits he has some burning in his central chest last week with breathing that has since resolved.   Review Of Systems: Per HPI with the following additions:   Review of Systems  Constitutional: Positive for chills and malaise/fatigue.  HENT: Negative for congestion and sinus pain.   Respiratory: Positive for cough. Negative for shortness of breath.   Cardiovascular: Positive for leg swelling. Negative for chest pain.  Gastrointestinal: Positive for constipation, diarrhea and nausea. Negative for abdominal pain, blood in stool and vomiting.  Genitourinary: Negative for dysuria and hematuria.  Neurological: Positive for weakness. Negative for dizziness and headaches.   Patient Active Problem List   Diagnosis Date Noted  . IV site infection ([place]) 03/31/2018  . Left leg weakness   . Stenosis of  brachiocephalic artery ([place]) 28/31/5176  . Lung nodule, solitary 03/28/2018  . Solitary pulmonary nodule on lung CT   . Normocytic anemia 03/26/2018  . Abdominal pain, acute, left upper quadrant   . Dysarthria   . Recurrent abdominal pain   . Dementia due to another medical condition, without behavioral disturbance ([place])   . Pruritus   . Left hemiplegia ([place])   . Insomnia 01/29/2016  . Psoriasiform dermatitis 07/12/2015  . Type 2 diabetes mellitus with peripheral neuropathy ([place]) 03/13/2015  . History of Hemiparesis  affecting left side as late effect of cerebrovascular accident ([place]) 02/18/2015  . Coronary artery disease involving native coronary artery of native heart without angina pectoris   . History of stroke   . PAF (paroxysmal atrial fibrillation) ([place]) 11/23/2014  . HLD (hyperlipidemia) 11/01/2014  . Tobacco use disorder 11/01/2014  . Carotid stenosis 11/01/2014  . Essential hypertension   . Polysubstance abuse ([place]) 08/28/2014  . Cocaine abuse ([place])   . Intracranial carotid stenosis   . Alcohol use disorder, moderate, dependence ([place])   . Stroke ([place])   . Chronic diastolic congestive heart failure ([place]) 11/21/2013  . GERD (gastroesophageal reflux disease) 08/10/2013  . Gout 08/10/2013  . Peripheral vascular disease ([place]) 07/27/2012  . COPD (chronic obstructive pulmonary disease) ([place]) 04/04/2012    Past Medical History: Past Medical History:  Diagnosis Date  . Acute blood loss anemia   . Acute CVA (cerebrovascular accident) ([place]) 08/27/2014  . Acute encephalopathy 11/28/2017  . Acute ischemic stroke ([place])   . AKI (acute kidney injury) ([place])   . Alcohol abuse    H/O  . Alcohol dependence with withdrawal with complication ([place]) 1/60/7371  . Anxiety   . Arterial ischemic stroke, MCA (middle cerebral artery), right, acute ([place])   . Arthritis   . Asthma   . Binocular vision disorder with diplopia 01/03/2013  . CAD (coronary artery disease) 05/27/10   Cath: severe single vessell CAD left cx midportion obtuse marginal 2 to 3.  . Cerebral infarction due to embolism of right middle cerebral artery ([place])   . Cerebral infarction due to stenosis of right carotid artery ([place]) 11/01/2014  . Cerebral thrombosis with cerebral infarction 07/18/2016  . Cerebrovascular accident (CVA) due to stenosis of right carotid artery ([place])   . CHF (congestive heart failure) ([place])   . Closed blow-out fracture of floor of orbit ([place]) 01/19/2013  . COPD (chronic obstructive pulmonary disease) ([place])   . COPD  (chronic obstructive pulmonary disease) ([place])   . CVA (cerebral vascular accident) ([place]) 08/26/2014  . Dementia due to another medical condition ([place]) 07/12/2015  . Depression   . Diabetes mellitus type 2 in obese ([place])   . Diabetes mellitus, type 2 ([place]) 03/13/2015  . Diastolic CHF, acute on chronic (HCC) 11/01/2014  . DOE (dyspnea on exertion) 11/23/2014  . DVT (deep venous thrombosis) ([place]) 02/22/2014  . Embolic stroke ([place])   . Enophthalmos due to trauma 01/19/2013  . Eyelid lesion 01/24/2013  . Fracture of inferior orbital wall ([place]) 01/03/2013  . Fracture of orbital floor ([place]) 01/03/2013  . GERD (gastroesophageal reflux disease)   . H/O total knee replacement 10/24/2014  . HCAP (healthcare-associated pneumonia) 10/15/2015  . Headache around the eyes 08/27/2014  . History of DVT (deep vein thrombosis)   . History of DVT of lower extremity   . Hyperkalemia 10/24/2014  . Hyperlipemia   . Hyperlipidemia   . Hypertension   . Hypoalbuminemia due to protein-calorie malnutrition ([place])   .  Hypotension   . Labile blood pressure   . Left shoulder pain 09/28/2017  . Lower extremity edema 02/21/2014  . Medial orbital wall fracture 01/19/2013  . Myocardial infarction ([place]) 2000  . Orbital fracture 12/2012  . OSA on CPAP   . Pain of left eye 01/03/2013  . Peripheral vascular disease, unspecified ([place])    08/20/10 doppler: increase in right ABI post-op. Left ABI stable. S/P bi-fem bypass surgery  . Pneumonia 04/04/2012  . Right middle cerebral artery stroke ([place]) 01/18/2018  . Seizures ([place])   . Shortness of breath   . Stroke (cerebrum) ([place]) 02/06/2015  . Stroke ([place])   . Thrombocytopenia ([place])   . Tobacco abuse   . Trichiasis without entropion 04/16/2013   Overview:  LLL central   . Type 2 diabetes mellitus with peripheral neuropathy ([place]) 03/13/2015  . Urinary incontinence 03/14/2015  . Urinary incontinence 03/14/2015    Past Surgical History: Past Surgical History:  Procedure Laterality  Date  . abdoninal ao angio & bifem angio  05/27/10   Patent graft, occluded bil stents with no retrograde flow into the hypogastric arteries. 100% occl left ant. tibial artery, 70% to 80% to 100% stenosis right superficial fem artery above adductor canal. 100 % occl right ant. tibial vessell  . Aortogram w/ PTCA  09/292003  . BACK SURGERY    . CARDIAC CATHETERIZATION  05/27/10   severe CAD left cx  . CHOLECYSTECTOMY    . ESOPHAGOGASTRIC FUNDOPLICATION    . EYE SURGERY    . FEMORAL BYPASS  08/19/10   Right Fem-Pop  . IR ANGIO INTRA EXTRACRAN SEL COM CAROTID INNOMINATE BILAT MOD SED  07/20/2016  . IR ANGIO INTRA EXTRACRAN SEL COM CAROTID INNOMINATE BILAT MOD SED  01/14/2018  . IR ANGIO VERTEBRAL SEL SUBCLAVIAN INNOMINATE UNI R MOD SED  07/20/2016  . IR ANGIO VERTEBRAL SEL VERTEBRAL BILAT MOD SED  01/14/2018  . IR ANGIO VERTEBRAL SEL VERTEBRAL UNI L MOD SED  07/20/2016  . IR RADIOLOGIST EVAL & MGMT  05/27/2016  . IR US GUIDE VASC ACCESS RIGHT  01/14/2018  . JOINT REPLACEMENT    . PERIPHERAL VASCULAR CATHETERIZATION N/A 12/10/2014   Procedure: Abdominal Aortogram;  Surgeon: Angelia Mould, MD;  Location: [facility];  Service: Cardiovascular;  Laterality: N/A;  . RADIOLOGY WITH ANESTHESIA N/A 02/08/2015   Procedure: RADIOLOGY WITH ANESTHESIA;  Surgeon: Luanne Bras, MD;  Location: [facility];  Service: Radiology;  Laterality: N/A;  . TEE WITHOUT CARDIOVERSION N/A 08/29/2014   Procedure: TRANSESOPHAGEAL ECHOCARDIOGRAM (TEE);  Surgeon: Josue Hector, MD;  Location: [facility] ENDOSCOPY;  Service: Cardiovascular;  Laterality: N/A;  . TOTAL KNEE ARTHROPLASTY      Social History: Social History   Tobacco Use  . Smoking status: Current Every Day Smoker    Packs/day: 0.50    Years: 50.00    Pack years: 25.00    Types: Cigars, Cigarettes    Start date: 02/09/1961  . Smokeless tobacco: Former Systems developer    Types: Chew  . Tobacco comment: 2 ppd, full flavor  Substance Use Topics  . Alcohol use: Yes     Comment: occasionally, "I might drink 1 every 6 months"  . Drug use: Yes    Types: Cocaine    Comment: "I smoke crack every once in a while"; last smoked yesterday   Additional social history: Prior smoker, 2-3 packs of cigars per day for 60 years. Prior alcohol use, last drink February 2020. Last had crack cocaine in  February 2020. Please also refer to relevant sections of EMR.  Family History: Family History  Problem Relation Age of Onset  . Heart attack Mother   . Heart failure Mother   . Cirrhosis Father   . Heart failure Brother   . Cancer Brother   . Hypertension Neg Hx        UNKNOWN  . Stroke Neg Hx        UNKNOWN    Allergies and Medications: Allergies  Allergen Reactions  . Darvocet [Propoxyphene N-Acetaminophen] Hives  . Haldol [Haloperidol Decanoate] Hives  . Acetaminophen Nausea Only    Upset stomach, tolerates Hydrocodone/APAP if taken with food  . Metformin And Related Nausea And Vomiting  . Norco [Hydrocodone-Acetaminophen] Nausea And Vomiting    Tolerates if taken with food   No current facility-administered medications on file prior to encounter.    Current Outpatient Medications on File Prior to Encounter  Medication Sig Dispense Refill  . clopidogrel (PLAVIX) 75 MG tablet Take 1 tablet (75 mg total) by mouth daily. 30 tablet 3  . DULoxetine (CYMBALTA) 60 MG capsule Take 1 capsule (60 mg total) by mouth daily. 30 capsule 3  . hydrOXYzine (ATARAX/VISTARIL) 25 MG tablet Take 1 tablet (25 mg total) by mouth every 8 (eight) hours as needed for itching. 30 tablet 0  . levETIRAcetam (KEPPRA) 500 MG tablet Take 1 tablet (500 mg total) by mouth 2 (two) times daily. 60 tablet 3  . pregabalin (LYRICA) 150 MG capsule Take 1 capsule (150 mg total) by mouth 2 (two) times daily. 180 capsule 0  . acetaminophen (TYLENOL) 325 MG tablet Take 2 tablets (650 mg total) by mouth every 4 (four) hours as needed for mild pain (or temp > 37.5 C (99.5 F)).    [name] allopurinol  (ZYLOPRIM) 300 MG tablet Take 1 tablet (300 mg total) by mouth daily. 30 tablet 3  . aspirin 325 MG tablet Take 1 tablet (325 mg total) by mouth daily.    [name] atorvastatin (LIPITOR) 80 MG tablet Take 1 tablet (80 mg total) by mouth daily at 6 PM. 30 tablet 3  . baclofen (LIORESAL) 10 MG tablet Take 1 tablet (10 mg total) by mouth at bedtime. 30 each 0  . camphor-menthol (SARNA) lotion Apply topically as needed for itching. 222 mL 0  . diphenhydramine-acetaminophen (TYLENOL PM) 25-500 MG TABS tablet Take 1 tablet by mouth at bedtime as needed. 14 tablet 0  . folic acid (FOLVITE) 1 MG tablet Take 1 tablet (1 mg total) by mouth daily.    [name] losartan (COZAAR) 25 MG tablet Take 1 tablet (25 mg total) by mouth daily. 30 tablet 2  . memantine (NAMENDA) 10 MG tablet Take 1 tablet (10 mg total) by mouth 2 (two) times daily. 6 tablet 0  . mometasone-formoterol (DULERA) 200-5 MCG/ACT AERO Inhale 2 puffs into the lungs 2 (two) times daily.    . nicotine (NICODERM CQ - DOSED IN MG/24 HOURS) 21 mg/24hr patch Place 1 patch (21 mg total) onto the skin daily. 28 patch 0  . pantoprazole (PROTONIX) 40 MG tablet Take 1 tablet (40 mg total) by mouth daily. 30 tablet 3  . WIXELA INHUB 250-50 MCG/DOSE AEPB Inhale 1-2 puffs into the lungs daily. 1 each 0    Objective: BP 130/61   Pulse 90   Temp 98.5 F (36.9 C) (Oral)   Resp (!) 22   Wt 98 kg   SpO2 100%   BMI 29.30 kg/m  Exam: General: A&O  x 3, no apparent distress Eyes: Pupils equal, round, reactive to light ENTM: No pharyngeal erythema  Neck: Nontender Cardiovascular: RRR Respiratory: Course breath sounds throughout bilaterally Gastrointestinal: Bowel sounds present, mild abdominal discomfort in left lower quadrant without peritoneal signs. Negative murphy's sign. MSK: 3/5 left lower extremity, 5/5 right lower extremity, 0/5 left upper extremity and left hand grip strength, 5/5 right upper extremity and grip strength Derm: Right buttocks with minor skin  breakdown without ulceration/bleeding/discharge. No sacral wound. Neuro: A&O x 3, unable to move left arm (at baseline), significant weakness in left leg (at baseline).   Psych: Appropriately responds to questions   Labs and Imaging: CBC BMET  Recent Labs  Lab 08/22/18 1430 08/22/18 1442  WBC 5.9  --   HGB 5.9* 6.8*  HCT 22.9* 20.0*  PLT 214  --    Recent Labs  Lab 08/22/18 1430 08/22/18 1442  NA 144 145  K 3.2* 3.2*  CL 113* 110  CO2 22  --   BUN 8 6*  CREATININE 0.84 0.70  GLUCOSE 189* 183*  CALCIUM 9.4  --      EKG: My interpretation - NSR with normal axis. P-waves difficult to appreciate but QRS complexes at regular intervals throughout.  Ct Angio Head W Or Wo Contrast  Result Date: 08/22/2018 CLINICAL DATA:  Right-sided weakness and slurred speech. EXAM: CT ANGIOGRAPHY HEAD AND NECK CT PERFUSION BRAIN TECHNIQUE: Multidetector CT imaging of the head and neck was performed using the standard protocol during bolus administration of intravenous contrast. Multiplanar CT image reconstructions and MIPs were obtained to evaluate the vascular anatomy. Carotid stenosis measurements (when applicable) are obtained utilizing NASCET criteria, using the distal internal carotid diameter as the denominator. Multiphase CT imaging of the brain was performed following IV bolus contrast injection. Subsequent parametric perfusion maps were calculated using RAPID software. CONTRAST:  44mL OMNIPAQUE IOHEXOL 350 MG/ML SOLN COMPARISON:  03/25/2018.  December 2019. head CT earlier today. FINDINGS: CTA NECK FINDINGS Aortic arch: Advanced aortic atherosclerosis with irregular plaque particularly at the arch. Branching pattern is normal. 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 Right carotid system: Common carotid artery shows extensive soft and calcified plaque but is sufficiently patent to the distal common carotid artery where there is a carotid artery stent. There is flow  in the external carotid artery but no flow in the internal carotid artery due to chronic occlusion at this level. Left carotid system: Common carotid artery shows some atherosclerotic plaque along its course but no stenosis proximal to the bifurcation. There is calcified plaque at the carotid bifurcation and ICA bulb. Minimal diameter is 4 mm, the same as the more distal cervical ICA, therefore no stenosis. Vertebral arteries: Right vertebral artery origin shows calcified plaque with stenosis of 50%. Left vertebral artery origin shows calcified plaque with stenosis also estimated at 50%. Both vertebral arteries are patent through the cervical region, with foci of atherosclerotic calcification resulting in 30-50% narrowing. No critical vertebral stenosis is seen. Skeleton: Ordinary spondylosis.  Chronic fusion C2-3. Other neck: No mass or lymphadenopathy. Upper chest: 2 cm spiculated mass in the right upper lobe highly likely to represent lung carcinoma. This has not changed much since February but was not present in 2014. Review of the MIP images confirms the above findings CTA HEAD FINDINGS Anterior circulation: Left internal carotid artery is patent through the skull base and siphon region. There is siphon atherosclerotic calcification with stenosis estimated at 50%. Left anterior and middle cerebral  vessels show flow, with mild atherosclerotic irregularity but no evidence of large or medium vessel occlusion. The right internal carotid artery does not show flow at the skull base. There is a stent of the carotid siphon region. There is reconstitution of the supraclinoid ICA probably from a combination of external to internal collaterals and flow through a patent anterior communicating artery. Flow is present within the right anterior and middle cerebral vessels, though the caliber is diminished when compared to the right side. No change since the previous study. Posterior circulation: Both vertebral arteries are  patent through the foramen magnum to the basilar. No basilar stenosis. Posterior circulation branch vessels are patent. Atherosclerotic irregularity of the PCA branches. Venous sinuses: Patent and normal. Anatomic variants: None significant. Delayed phase: Not performed. Review of the MIP images confirms the above findings CT Brain Perfusion Findings: Severely motion degraded exam, without useful data because of this. IMPRESSION: No large or medium vessel occlusion identified acutely. Perfusion imaging is severely degraded by motion and not useful. Advanced chronic atherosclerotic disease of the aorta. 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 Right carotid siphon stent without proximal flow. Flow in the left anterior and middle cerebral arteries likely due to a patent anterior communicating artery. Diminished caliber related to diminished flow volume. No acute finding however. Left carotid bifurcation atherosclerotic disease with no measurable stenosis. No left anterior circulation large or medium vessel occlusion. 50% stenoses at both vertebral artery origins. Serial focal stenoses throughout both vertebral arteries, but without critical stenosis. Perfusion study suffers from severe motion and is not usable. 2 cm spiculated mass in the right upper lobe consistent with lung carcinoma. Little change since February. However, the lesion was not present in 2014. These results were communicated to Dr. Rory Percy at 3:45 pmon 7/13/2020by text page via the [name] messaging system. Electronically Signed   By: Nelson Chimes M.D.   On: 08/22/2018 15:56   Ct Angio Neck W Or Wo Contrast  Result Date: 08/22/2018 CLINICAL DATA:  Right-sided weakness and slurred speech. EXAM: CT ANGIOGRAPHY HEAD AND NECK CT PERFUSION BRAIN TECHNIQUE: Multidetector CT imaging of the head and neck was performed using the standard protocol during bolus administration of  intravenous contrast. Multiplanar CT image reconstructions and MIPs were obtained to evaluate the vascular anatomy. Carotid stenosis measurements (when applicable) are obtained utilizing NASCET criteria, using the distal internal carotid diameter as the denominator. Multiphase CT imaging of the brain was performed following IV bolus contrast injection. Subsequent parametric perfusion maps were calculated using RAPID software. CONTRAST:  33mL OMNIPAQUE IOHEXOL 350 MG/ML SOLN COMPARISON:  03/25/2018.  December 2019. head CT earlier today. FINDINGS: CTA NECK FINDINGS Aortic arch: Advanced aortic atherosclerosis with irregular plaque particularly at the arch. Branching pattern is normal. 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 Right carotid system: Common carotid artery shows extensive soft and calcified plaque but is sufficiently patent to the distal common carotid artery where there is a carotid artery stent. There is flow in the external carotid artery but no flow in the internal carotid artery due to chronic occlusion at this level. Left carotid system: Common carotid artery shows some atherosclerotic plaque along its course but no stenosis proximal to the bifurcation. There is calcified plaque at the carotid bifurcation and ICA bulb. Minimal diameter is 4 mm, the same as the more distal cervical ICA, therefore no stenosis. Vertebral arteries: Right vertebral artery origin  shows calcified plaque with stenosis of 50%. Left vertebral artery origin shows calcified plaque with stenosis also estimated at 50%. Both vertebral arteries are patent through the cervical region, with foci of atherosclerotic calcification resulting in 30-50% narrowing. No critical vertebral stenosis is seen. Skeleton: Ordinary spondylosis.  Chronic fusion C2-3. Other neck: No mass or lymphadenopathy. Upper chest: 2 cm spiculated mass in the right upper lobe highly likely to represent lung carcinoma. This has not  changed much since February but was not present in 2014. Review of the MIP images confirms the above findings CTA HEAD FINDINGS Anterior circulation: Left internal carotid artery is patent through the skull base and siphon region. There is siphon atherosclerotic calcification with stenosis estimated at 50%. Left anterior and middle cerebral vessels show flow, with mild atherosclerotic irregularity but no evidence of large or medium vessel occlusion. The right internal carotid artery does not show flow at the skull base. There is a stent of the carotid siphon region. There is reconstitution of the supraclinoid ICA probably from a combination of external to internal collaterals and flow through a patent anterior communicating artery. Flow is present within the right anterior and middle cerebral vessels, though the caliber is diminished when compared to the right side. No change since the previous study. Posterior circulation: Both vertebral arteries are patent through the foramen magnum to the basilar. No basilar stenosis. Posterior circulation branch vessels are patent. Atherosclerotic irregularity of the PCA branches. Venous sinuses: Patent and normal. Anatomic variants: None significant. Delayed phase: Not performed. Review of the MIP images confirms the above findings CT Brain Perfusion Findings: Severely motion degraded exam, without useful data because of this. IMPRESSION: No large or medium vessel occlusion identified acutely. Perfusion imaging is severely degraded by motion and not useful. Advanced chronic atherosclerotic disease of the aorta. 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 Right carotid siphon stent without proximal flow. Flow in the left anterior and middle cerebral arteries likely due to a patent anterior communicating artery. Diminished caliber related to diminished flow volume. No acute finding however. Left carotid  bifurcation atherosclerotic disease with no measurable stenosis. No left anterior circulation large or medium vessel occlusion. 50% stenoses at both vertebral artery origins. Serial focal stenoses throughout both vertebral arteries, but without critical stenosis. Perfusion study suffers from severe motion and is not usable. 2 cm spiculated mass in the right upper lobe consistent with lung carcinoma. Little change since February. However, the lesion was not present in 2014. These results were communicated to Dr. Rory Percy at 3:45 pmon 7/13/2020by text page via the [facility] messaging system. Electronically Signed   By: Nelson Chimes M.D.   On: 08/22/2018 15:56   Ct Cerebral Perfusion W Contrast  Result Date: 08/22/2018 CLINICAL DATA:  Right-sided weakness and slurred speech. EXAM: CT ANGIOGRAPHY HEAD AND NECK CT PERFUSION BRAIN TECHNIQUE: Multidetector CT imaging of the head and neck was performed using the standard protocol during bolus administration of intravenous contrast. Multiplanar CT image reconstructions and MIPs were obtained to evaluate the vascular anatomy. Carotid stenosis measurements (when applicable) are obtained utilizing NASCET criteria, using the distal internal carotid diameter as the denominator. Multiphase CT imaging of the brain was performed following IV bolus contrast injection. Subsequent parametric perfusion maps were calculated using RAPID software. CONTRAST:  82mL OMNIPAQUE IOHEXOL 350 MG/ML SOLN COMPARISON:  03/25/2018.  December 2019. head CT earlier today. FINDINGS: CTA NECK FINDINGS Aortic arch: Advanced aortic atherosclerosis  with irregular plaque particularly at the arch. Branching pattern is normal. 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 Right carotid system: Common carotid artery shows extensive soft and calcified plaque but is sufficiently patent to the distal common carotid artery where there is a carotid artery stent. There is flow in the external  carotid artery but no flow in the internal carotid artery due to chronic occlusion at this level. Left carotid system: Common carotid artery shows some atherosclerotic plaque along its course but no stenosis proximal to the bifurcation. There is calcified plaque at the carotid bifurcation and ICA bulb. Minimal diameter is 4 mm, the same as the more distal cervical ICA, therefore no stenosis. Vertebral arteries: Right vertebral artery origin shows calcified plaque with stenosis of 50%. Left vertebral artery origin shows calcified plaque with stenosis also estimated at 50%. Both vertebral arteries are patent through the cervical region, with foci of atherosclerotic calcification resulting in 30-50% narrowing. No critical vertebral stenosis is seen. Skeleton: Ordinary spondylosis.  Chronic fusion C2-3. Other neck: No mass or lymphadenopathy. Upper chest: 2 cm spiculated mass in the right upper lobe highly likely to represent lung carcinoma. This has not changed much since February but was not present in 2014. Review of the MIP images confirms the above findings CTA HEAD FINDINGS Anterior circulation: Left internal carotid artery is patent through the skull base and siphon region. There is siphon atherosclerotic calcification with stenosis estimated at 50%. Left anterior and middle cerebral vessels show flow, with mild atherosclerotic irregularity but no evidence of large or medium vessel occlusion. The right internal carotid artery does not show flow at the skull base. There is a stent of the carotid siphon region. There is reconstitution of the supraclinoid ICA probably from a combination of external to internal collaterals and flow through a patent anterior communicating artery. Flow is present within the right anterior and middle cerebral vessels, though the caliber is diminished when compared to the right side. No change since the previous study. Posterior circulation: Both vertebral arteries are patent through the  foramen magnum to the basilar. No basilar stenosis. Posterior circulation branch vessels are patent. Atherosclerotic irregularity of the PCA branches. Venous sinuses: Patent and normal. Anatomic variants: None significant. Delayed phase: Not performed. Review of the MIP images confirms the above findings CT Brain Perfusion Findings: Severely motion degraded exam, without useful data because of this. IMPRESSION: No large or medium vessel occlusion identified acutely. Perfusion imaging is severely degraded by motion and not useful. Advanced chronic atherosclerotic disease of the aorta. 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 Right carotid siphon stent without proximal flow. Flow in the left anterior and middle cerebral arteries likely due to a patent anterior communicating artery. Diminished caliber related to diminished flow volume. No acute finding however. Left carotid bifurcation atherosclerotic disease with no measurable stenosis. No left anterior circulation large or medium vessel occlusion. 50% stenoses at both vertebral artery origins. Serial focal stenoses throughout both vertebral arteries, but without critical stenosis. Perfusion study suffers from severe motion and is not usable. 2 cm spiculated mass in the right upper lobe consistent with lung carcinoma. Little change since February. However, the lesion was not present in 2014. These results were communicated to Dr. Rory Percy at 3:45 pmon 7/13/2020by text page via the [facility] messaging system. Electronically Signed   By: Nelson Chimes M.D.   On: 08/22/2018 15:56   Ct  Head Code Stroke Wo Contrast  Result Date: 08/22/2018 CLINICAL DATA:  Code stroke. Altered level of consciousness. Right-sided deficit. Slurred speech EXAM: CT HEAD WITHOUT CONTRAST TECHNIQUE: Contiguous axial images were obtained from the base of the skull through the vertex without intravenous contrast. COMPARISON:   CT head 01/12/2018 FINDINGS: Brain: Mild atrophy. Ventricle size normal. Chronic right MCA infarct over the convexity is unchanged. Negative for acute infarct, hemorrhage, or mass.  No midline shift. Vascular: Negative for hyperdense vessel Skull: Negative Sinuses/Orbits: Surgical repair of left orbital floor fracture. Mucosal edema left maxillary sinus. Bilateral cataract surgery. Other: None ASPECTS ([place] Stroke Program Early CT Score) - Ganglionic level infarction (caudate, lentiform nuclei, internal capsule, insula, M1-M3 cortex): 7 - Supraganglionic infarction (M4-M6 cortex): 3 Total score (0-10 with 10 being normal): 10 IMPRESSION: 1. No acute abnormality 2. Chronic right MCA infarct 3. ASPECTS is 10 4. These results were called by telephone at the time of interpretation on 08/22/2018 at 2:45 pm to Dr. Rory Percy, who verbally acknowledged these results. Electronically Signed   By: Franchot Gallo M.D.   On: 08/22/2018 14:45    Lurline Del, MD 08/22/2018, 7:30 PM PGY-1, [place] Intern pager: [phone], text pages welcome  FPTS Upper-Level Resident Addendum   I have independently interviewed and examined the patient. I have discussed the above with the original author and agree with their documentation. My edits for correction/addition/clarification are in green. Please see also any attending notes.    Rory Percy, DO PGY-3, [facility] 08/23/2018 12:41 AM  FPTS Service pager: [phone] (text pages welcome through [facility])

## 2018-08-22 NOTE — Consult Note (Addendum)
Neurology Consultation  Reason for Consult: stroke called by EMS for ?Right sided weakness Referring Physician: Dr. Gilford Raid  CC: Right sided weakness  History is obtained from: EMS, Chart, Patient, Call with facility-not able to gather much information on the phone call with the facility.  HPI: [name] is a 69 y.o. male PMH of acute ischemic stroke in 2019 with dense left hemiplegia, hypertension, hyperlipidemia, diabetes, CHF, coronary artery disease, alcohol abuse, COPD, cocaine abuse, resident of a nursing home facility, brought in for concerns of severe anemia and hypotension with systolic blood pressures in 80s and 90s. Patient was rounded on by this morning by the facility physicians and staff and noted to have a low hemoglobin and was sent over to the hospital via EMS for further evaluation.  On the way, the EMS noted that he had some right facial droop and prominent slurred speech, which in hindsight he has mild dysarthria at baseline but this was probably a little worse than baseline. He was brought in as a code stroke but last known normal at around 10 AM.  By the time he arrived and got a CT imaging, he was outside the IV TPA window already. The patient did not report any right-sided symptoms.  He reports only of generalized weakness.  LKW: 10 AM on 08/22/2018 tpa given?: no, likely recrudescence of old symptoms in the setting of severe anemia and hypoperfusion. Premorbid modified Rankin scale (mRS):3  ROS: ROS was performed and is negative except as noted in the HPI.  Past Medical History:  Diagnosis Date  . Acute blood loss anemia   . Acute CVA (cerebrovascular accident) ([place]) 08/27/2014  . Acute encephalopathy 11/28/2017  . Acute ischemic stroke ([place])   . AKI (acute kidney injury) ([place])   . Alcohol abuse    H/O  . Alcohol dependence with withdrawal with complication ([place]) 0/92/3300  . Anxiety   . Arterial ischemic stroke, MCA (middle cerebral artery), right,  acute ([place])   . Arthritis   . Asthma   . Binocular vision disorder with diplopia 01/03/2013  . CAD (coronary artery disease) 05/27/10   Cath: severe single vessell CAD left cx midportion obtuse marginal 2 to 3.  . Cerebral infarction due to embolism of right middle cerebral artery ([place])   . Cerebral infarction due to stenosis of right carotid artery ([place]) 11/01/2014  . Cerebral thrombosis with cerebral infarction 07/18/2016  . Cerebrovascular accident (CVA) due to stenosis of right carotid artery ([place])   . CHF (congestive heart failure) ([place])   . Closed blow-out fracture of floor of orbit ([place]) 01/19/2013  . COPD (chronic obstructive pulmonary disease) ([place])   . COPD (chronic obstructive pulmonary disease) ([place])   . CVA (cerebral vascular accident) ([place]) 08/26/2014  . Dementia due to another medical condition ([place]) 07/12/2015  . Depression   . Diabetes mellitus type 2 in obese ([place])   . Diabetes mellitus, type 2 ([place]) 03/13/2015  . Diastolic CHF, acute on chronic (HCC) 11/01/2014  . DOE (dyspnea on exertion) 11/23/2014  . DVT (deep venous thrombosis) ([place]) 02/22/2014  . Embolic stroke ([place])   . Enophthalmos due to trauma 01/19/2013  . Eyelid lesion 01/24/2013  . Fracture of inferior orbital wall ([place]) 01/03/2013  . Fracture of orbital floor ([place]) 01/03/2013  . GERD (gastroesophageal reflux disease)   . H/O total knee replacement 10/24/2014  . HCAP (healthcare-associated pneumonia) 10/15/2015  . Headache around the eyes 08/27/2014  . History of DVT (deep vein thrombosis)   .  History of DVT of lower extremity   . Hyperkalemia 10/24/2014  . Hyperlipemia   . Hyperlipidemia   . Hypertension   . Hypoalbuminemia due to protein-calorie malnutrition ([place])   . Hypotension   . Labile blood pressure   . Left shoulder pain 09/28/2017  . Lower extremity edema 02/21/2014  . Medial orbital wall fracture 01/19/2013  . Myocardial infarction ([place]) 2000  . Orbital fracture 12/2012  . OSA on CPAP   . Pain  of left eye 01/03/2013  . Peripheral vascular disease, unspecified ([place])    08/20/10 doppler: increase in right ABI post-op. Left ABI stable. S/P bi-fem bypass surgery  . Pneumonia 04/04/2012  . Right middle cerebral artery stroke ([place]) 01/18/2018  . Seizures ([place])   . Shortness of breath   . Stroke (cerebrum) ([place]) 02/06/2015  . Stroke ([place])   . Thrombocytopenia ([place])   . Tobacco abuse   . Trichiasis without entropion 04/16/2013   Overview:  LLL central   . Type 2 diabetes mellitus with peripheral neuropathy ([place]) 03/13/2015  . Urinary incontinence 03/14/2015  . Urinary incontinence 03/14/2015   Family History  Problem Relation Age of Onset  . Heart attack Mother   . Heart failure Mother   . Cirrhosis Father   . Heart failure Brother   . Cancer Brother   . Hypertension Neg Hx        UNKNOWN  . Stroke Neg Hx        UNKNOWN   Social History:   reports that he has been smoking cigars and cigarettes. He started smoking about 57 years ago. He has a 25.00 pack-year smoking history. He has quit using smokeless tobacco.  His smokeless tobacco use included chew. He reports current alcohol use. He reports current drug use. Drug: Cocaine.  Medications No current facility-administered medications for this encounter.   Current Outpatient Medications:  .  acetaminophen (TYLENOL) 325 MG tablet, Take 2 tablets (650 mg total) by mouth every 4 (four) hours as needed for mild pain (or temp > 37.5 C (99.5 F))., Disp: , Rfl:  .  allopurinol (ZYLOPRIM) 300 MG tablet, Take 1 tablet (300 mg total) by mouth daily., Disp: 30 tablet, Rfl: 3 .  aspirin 325 MG tablet, Take 1 tablet (325 mg total) by mouth daily., Disp: , Rfl:  .  atorvastatin (LIPITOR) 80 MG tablet, Take 1 tablet (80 mg total) by mouth daily at 6 PM., Disp: 30 tablet, Rfl: 3 .  baclofen (LIORESAL) 10 MG tablet, Take 1 tablet (10 mg total) by mouth at bedtime., Disp: 30 each, Rfl: 0 .  camphor-menthol (SARNA) lotion, Apply topically as needed for  itching., Disp: 222 mL, Rfl: 0 .  clopidogrel (PLAVIX) 75 MG tablet, Take 1 tablet (75 mg total) by mouth daily., Disp: 30 tablet, Rfl: 3 .  diphenhydramine-acetaminophen (TYLENOL PM) 25-500 MG TABS tablet, Take 1 tablet by mouth at bedtime as needed., Disp: 14 tablet, Rfl: 0 .  DULoxetine (CYMBALTA) 60 MG capsule, Take 1 capsule (60 mg total) by mouth daily., Disp: 30 capsule, Rfl: 3 .  folic acid (FOLVITE) 1 MG tablet, Take 1 tablet (1 mg total) by mouth daily., Disp: , Rfl:  .  hydrOXYzine (ATARAX/VISTARIL) 25 MG tablet, Take 1 tablet (25 mg total) by mouth every 8 (eight) hours as needed for itching., Disp: 30 tablet, Rfl: 0 .  levETIRAcetam (KEPPRA) 500 MG tablet, Take 1 tablet (500 mg total) by mouth 2 (two) times daily., Disp: 60 tablet, Rfl: 3 .  losartan (COZAAR) 25 MG tablet, Take 1 tablet (25 mg total) by mouth daily., Disp: 30 tablet, Rfl: 2 .  memantine (NAMENDA) 10 MG tablet, Take 1 tablet (10 mg total) by mouth 2 (two) times daily., Disp: 6 tablet, Rfl: 0 .  mometasone-formoterol (DULERA) 200-5 MCG/ACT AERO, Inhale 2 puffs into the lungs 2 (two) times daily., Disp: , Rfl:  .  nicotine (NICODERM CQ - DOSED IN MG/24 HOURS) 21 mg/24hr patch, Place 1 patch (21 mg total) onto the skin daily., Disp: 28 patch, Rfl: 0 .  pantoprazole (PROTONIX) 40 MG tablet, Take 1 tablet (40 mg total) by mouth daily., Disp: 30 tablet, Rfl: 3 .  pregabalin (LYRICA) 150 MG capsule, Take 1 capsule (150 mg total) by mouth 2 (two) times daily., Disp: 180 capsule, Rfl: 0 .  WIXELA INHUB 250-50 MCG/DOSE AEPB, Inhale 1-2 puffs into the lungs daily., Disp: 1 each, Rfl: 0   Exam: Current vital signs: BP 93/64   Pulse 90   Temp 98.8 F (37.1 C) (Oral)   Resp (!) 23   Wt 98 kg   SpO2 100%   BMI 29.30 kg/m  Vital signs in last 24 hours: Temp:  [98.8 F (37.1 C)] 98.8 F (37.1 C) (07/13 1527) Pulse Rate:  [90] 90 (07/13 1552) Resp:  [23] 23 (07/13 1552) BP: (93)/(64) 93/64 (07/13 1552) SpO2:  [100 %] 100  % (07/13 1552) Weight:  [98 kg] 98 kg (07/13 1527) General: Awake alert in no distress HEENT: Normocephalic atraumatic, conjunctival pallor noted. Lungs: Clear to auscultation Cardiovascular: S1-S2 heard Abdomen: Soft nondistended nontender Extremities: Pale appearing with intact pulses cool to touch Neurological exam Awake alert oriented x3 Speech is moderately dysarthric Naming comprehension repetition intact Cranial nerves: Pupils equal round reactive light, extraocular movements intact, visual fields full to confrontation, face symmetric, auditory acuity intact, palate midline, tongue midline. Motor: dense left hemiparesis with minimal movement of the LUE and LLE -1-2/5 at best.  Right upper and lower extremity are antigravity without drift. Sensory exam: Intact light touch bilaterally Coordination: Intact finger-nose-finger on the right Gait testing deferred at this time   NIHSS 1a Level of Conscious.: 0 1b LOC Questions: 0 1c LOC Commands: 0 2 Best Gaze: 0 3 Visual: 0 4 Facial Palsy: 1 5a Motor Arm - left: 3 5b Motor Arm - Right: 0 6a Motor Leg - Left: 3 6b Motor Leg - Right: 0 7 Limb Ataxia: 0 8 Sensory: 0 9 Best Language: 0 10 Dysarthria: 2 11 Extinct. and Inatten.: 0 TOTAL: 9  Labs I have reviewed labs in epic and the results pertinent to this consultation are: Hemoglobin 5.9 Hematocrit 22.9 MCV 74.6 CBC    Component Value Date/Time   WBC 5.9 08/22/2018 1430   RBC 3.07 (L) 08/22/2018 1430   HGB 6.8 (LL) 08/22/2018 1442   HGB 10.5 (L) 04/25/2018 1354   HCT 20.0 (L) 08/22/2018 1442   PLT 214 08/22/2018 1430   PLT 167 04/25/2018 1354   MCV 74.6 (L) 08/22/2018 1430   MCH 19.2 (L) 08/22/2018 1430   MCHC 25.8 (L) 08/22/2018 1430   RDW 17.7 (H) 08/22/2018 1430   LYMPHSABS 1.2 08/22/2018 1430   MONOABS 0.7 08/22/2018 1430   EOSABS 0.4 08/22/2018 1430   BASOSABS 0.1 08/22/2018 1430    CMP     Component Value Date/Time   NA 145 08/22/2018 1442   NA 145  (H) 08/19/2017 0952   K 3.2 (L) 08/22/2018 1442   CL 110 08/22/2018 1442  CO2 22 08/22/2018 1430   GLUCOSE 183 (H) 08/22/2018 1442   BUN 6 (L) 08/22/2018 1442   BUN 14 08/19/2017 0952   CREATININE 0.70 08/22/2018 1442   CREATININE 0.83 04/25/2018 1354   CREATININE CANCELED 04/28/2016 1515   CALCIUM 9.4 08/22/2018 1430   PROT 5.9 (L) 08/22/2018 1430   PROT 6.4 08/19/2017 0952   ALBUMIN 3.2 (L) 08/22/2018 1430   ALBUMIN 4.0 08/19/2017 0952   AST 15 08/22/2018 1430   AST 17 04/25/2018 1354   ALT 11 08/22/2018 1430   ALT 17 04/25/2018 1354   ALKPHOS 67 08/22/2018 1430   BILITOT 0.8 08/22/2018 1430   BILITOT 0.5 04/25/2018 1354   GFRNONAA >60 08/22/2018 1430   GFRNONAA >60 04/25/2018 1354   GFRNONAA CANCELED 04/28/2016 1515   GFRAA >60 08/22/2018 1430   GFRAA >60 04/25/2018 1354   GFRAA CANCELED 04/28/2016 1515    Imaging I have reviewed the images obtained:  CT-scan of the brain-no acute changes.  Redemonstrated are the chronic encephalomalacia changes in the right MCA territory.  Aspects 10.  No bleed.  CT angios head and neck was motion riddled.  Chronically occluded right ICA.  The CT perfusion study showed bilateral watershed looking areas of penumbra without any core-likely resultant of the hypoperfusion in the setting of hypotension and severe anemia.  Assessment: 68 year old man past history of an acute stroke in 2019 with dense left hemiplegia, hypertension, hyperlipidemia, diabetes, CHF, coronary artery disease alcohol and cocaine abuse, COPD brought in for evaluation of severely low hemoglobin, hypotension and en route noticed to have some right facial weakness and worsening dysarthria for which a code stroke was activated. On my exam, his exam is not much changed from his prior documented exams. He is also severely anemic with hemoglobin less than 6. His CT perfusion pattern is suggestive of global hypoperfusion. Is not a candidate for TPA due to severe anemia as well  as not much change on his baseline exam. Not a candidate for EVT due to poor baseline modified Rankin. Symptoms likely secondary to systemic hypotension leading to cerebral hypoperfusion due to severe anemia versus recrudescence of old stroke symptoms in the setting of hypotension/cerebral hypoperfusion.  Recommendations: Emergent management of the acute anemia Evaluate for possible sources of acute blood loss I would not recommend any further neurological interventions or diagnostics at this time. After correction of his anemia and hypotension, if he continues to be symptomatic, an MRI can be done to look for any evidence of established infarction, but that is unlikely to change anything in the course of his management and I will leave it at the discretion of the primary team.  Discussed my plan in person with Dr. Wilson Singer in the ER. Neurology will be available as needed.  Please call with questions.   -- Amie Portland, MD [facility] Pager: [phone] If 7pm to 7am, please call on call as listed on AMION.   CRITICAL CARE ATTESTATION Performed by: Amie Portland, MD Total critical care time: 50 minutes Critical care time was exclusive of separately billable procedures and treating other patients and/or supervising APPs/Residents/Students Critical care was necessary to treat or prevent imminent or life-threatening deterioration due to strokelike symptoms, severe acute anemia This patient is critically ill and at significant risk for neurological worsening and/or death and care requires constant monitoring. Critical care was time spent personally by me on the following activities: development of treatment plan with patient and/or surrogate as well as nursing, discussions with consultants, evaluation of  patient's response to treatment, examination of patient, obtaining history from patient or surrogate, ordering and performing treatments and interventions, ordering and review of  laboratory studies, ordering and review of radiographic studies, pulse oximetry, re-evaluation of patient's condition, participation in multidisciplinary rounds and medical decision making of high complexity in the care of this patient.

## 2018-08-22 NOTE — ED Provider Notes (Signed)
[facility] EMERGENCY DEPARTMENT Provider Note   CSN: [id number] Arrival date & time: 08/22/18  1427  An emergency department physician performed an initial assessment on this suspected stroke patient at 1429.  History   Chief Complaint Chief Complaint  Patient presents with   Code Stroke    HPI [name] is a 68 y.o. male.     HPI   75yM with possible R sided weakness. Noted by EMS. LKN at 1000 today. Hx of L sided weakness from prior CVA. EMS initially called out for transfer for evaluation of anemia and low blood pressure. EMS noted what they thought were acute neurological changes in route. PT himself has little complaints. Says he feels generally weak.   Past Medical History:  Diagnosis Date   Acute blood loss anemia    Acute CVA (cerebrovascular accident) ([place]) 08/27/2014   Acute encephalopathy 11/28/2017   Acute ischemic stroke (HCC)    AKI (acute kidney injury) ([place])    Alcohol abuse    H/O   Alcohol dependence with withdrawal with complication ([place]) 4/31/5400   Anxiety    Arterial ischemic stroke, MCA (middle cerebral artery), right, acute (HCC)    Arthritis    Asthma    Binocular vision disorder with diplopia 01/03/2013   CAD (coronary artery disease) 05/27/10   Cath: severe single vessell CAD left cx midportion obtuse marginal 2 to 3.   Cerebral infarction due to embolism of right middle cerebral artery (HCC)    Cerebral infarction due to stenosis of right carotid artery ([place]) 11/01/2014   Cerebral thrombosis with cerebral infarction 07/18/2016   Cerebrovascular accident (CVA) due to stenosis of right carotid artery (HCC)    CHF (congestive heart failure) ([place])    Closed blow-out fracture of floor of orbit ([place]) 01/19/2013   COPD (chronic obstructive pulmonary disease) (HCC)    COPD (chronic obstructive pulmonary disease) (HCC)    CVA (cerebral vascular accident) ([place]) 08/26/2014   Dementia due to another medical  condition ([place]) 07/12/2015   Depression    Diabetes mellitus type 2 in obese ([place])    Diabetes mellitus, type 2 ([place]) 09/14/7617   Diastolic CHF, acute on chronic ([place]) 11/01/2014   DOE (dyspnea on exertion) 11/23/2014   DVT (deep venous thrombosis) ([place]) 06/17/3265   Embolic stroke ([place])    Enophthalmos due to trauma 01/19/2013   Eyelid lesion 01/24/2013   Fracture of inferior orbital wall ([place]) 01/03/2013   Fracture of orbital floor ([place]) 01/03/2013   GERD (gastroesophageal reflux disease)    H/O total knee replacement 10/24/2014   HCAP (healthcare-associated pneumonia) 10/15/2015   Headache around the eyes 08/27/2014   History of DVT (deep vein thrombosis)    History of DVT of lower extremity    Hyperkalemia 10/24/2014   Hyperlipemia    Hyperlipidemia    Hypertension    Hypoalbuminemia due to protein-calorie malnutrition ([place])    Hypotension    Labile blood pressure    Left shoulder pain 09/28/2017   Lower extremity edema 02/21/2014   Medial orbital wall fracture 01/19/2013   Myocardial infarction ([place]) 2000   Orbital fracture 12/2012   OSA on CPAP    Pain of left eye 01/03/2013   Peripheral vascular disease, unspecified ([place])    08/20/10 doppler: increase in right ABI post-op. Left ABI stable. S/P bi-fem bypass surgery   Pneumonia 04/04/2012   Right middle cerebral artery stroke ([place]) 01/18/2018   Seizures (HCC)    Shortness of breath  Stroke (cerebrum) ([place]) 02/06/2015   Stroke ([place])    Thrombocytopenia ([place])    Tobacco abuse    Trichiasis without entropion 04/16/2013   Overview:  LLL central    Type 2 diabetes mellitus with peripheral neuropathy ([place]) 03/13/2015   Urinary incontinence 03/14/2015   Urinary incontinence 03/14/2015    Patient Active Problem List   Diagnosis Date Noted   IV site infection ([place]) 03/31/2018   Left leg weakness    Stenosis of brachiocephalic artery ([place]) 32/99/2426   Lung nodule, solitary 03/28/2018    Solitary pulmonary nodule on lung CT    Normocytic anemia 03/26/2018   Abdominal pain, acute, left upper quadrant    Dysarthria    Recurrent abdominal pain    Dementia due to another medical condition, without behavioral disturbance ([place])    Pruritus    Left hemiplegia ([place])    Insomnia 01/29/2016   Psoriasiform dermatitis 07/12/2015   Type 2 diabetes mellitus with peripheral neuropathy ([place]) 03/13/2015   History of Hemiparesis affecting left side as late effect of cerebrovascular accident ([place]) 02/18/2015   Coronary artery disease involving native coronary artery of native heart without angina pectoris    History of stroke    PAF (paroxysmal atrial fibrillation) ([place]) 11/23/2014   HLD (hyperlipidemia) 11/01/2014   Tobacco use disorder 11/01/2014   Carotid stenosis 11/01/2014   Essential hypertension    Polysubstance abuse ([place]) 08/28/2014   Cocaine abuse ([place])    Intracranial carotid stenosis    Alcohol use disorder, moderate, dependence ([place])    Stroke ([place])    Chronic diastolic congestive heart failure ([place]) 11/21/2013   GERD (gastroesophageal reflux disease) 08/10/2013   Gout 08/10/2013   Peripheral vascular disease ([place]) 07/27/2012   COPD (chronic obstructive pulmonary disease) ([place]) 04/04/2012    Past Surgical History:  Procedure Laterality Date   abdoninal ao angio & bifem angio  05/27/10   Patent graft, occluded bil stents with no retrograde flow into the hypogastric arteries. 100% occl left ant. tibial artery, 70% to 80% to 100% stenosis right superficial fem artery above adductor canal. 100 % occl right ant. tibial vessell   Aortogram w/ PTCA  09/292003   BACK SURGERY     CARDIAC CATHETERIZATION  05/27/10   severe CAD left cx   CHOLECYSTECTOMY     ESOPHAGOGASTRIC FUNDOPLICATION     EYE SURGERY     FEMORAL BYPASS  08/19/10   Right Fem-Pop   IR ANGIO INTRA EXTRACRAN SEL COM CAROTID INNOMINATE BILAT MOD SED  07/20/2016   IR  ANGIO INTRA EXTRACRAN SEL COM CAROTID INNOMINATE BILAT MOD SED  01/14/2018   IR ANGIO VERTEBRAL SEL SUBCLAVIAN INNOMINATE UNI R MOD SED  07/20/2016   IR ANGIO VERTEBRAL SEL VERTEBRAL BILAT MOD SED  01/14/2018   IR ANGIO VERTEBRAL SEL VERTEBRAL UNI L MOD SED  07/20/2016   IR RADIOLOGIST EVAL & MGMT  05/27/2016   IR US GUIDE VASC ACCESS RIGHT  01/14/2018   JOINT REPLACEMENT     PERIPHERAL VASCULAR CATHETERIZATION N/A 12/10/2014   Procedure: Abdominal Aortogram;  Surgeon: Angelia Mould, MD;  Location: [facility];  Service: Cardiovascular;  Laterality: N/A;   RADIOLOGY WITH ANESTHESIA N/A 02/08/2015   Procedure: RADIOLOGY WITH ANESTHESIA;  Surgeon: Luanne Bras, MD;  Location: [facility];  Service: Radiology;  Laterality: N/A;   TEE WITHOUT CARDIOVERSION N/A 08/29/2014   Procedure: TRANSESOPHAGEAL ECHOCARDIOGRAM (TEE);  Surgeon: Josue Hector, MD;  Location: [facility];  Service: Cardiovascular;  Laterality: N/A;  TOTAL KNEE ARTHROPLASTY         Home Medications    Prior to Admission medications   Medication Sig Start Date End Date Taking? Authorizing Provider  acetaminophen (TYLENOL) 325 MG tablet Take 2 tablets (650 mg total) by mouth every 4 (four) hours as needed for mild pain (or temp > 37.5 C (99.5 F)). 04/01/18   Milus Banister C, DO  allopurinol (ZYLOPRIM) 300 MG tablet Take 1 tablet (300 mg total) by mouth daily. 04/01/18   Daisy Floro, DO  aspirin 325 MG tablet Take 1 tablet (325 mg total) by mouth daily. 04/01/18   Daisy Floro, DO  atorvastatin (LIPITOR) 80 MG tablet Take 1 tablet (80 mg total) by mouth daily at 6 PM. 04/01/18   Daisy Floro, DO  baclofen (LIORESAL) 10 MG tablet Take 1 tablet (10 mg total) by mouth at bedtime. 04/01/18   Daisy Floro, DO  camphor-menthol [facility]) lotion Apply topically as needed for itching. 04/01/18   Daisy Floro, DO  clopidogrel (PLAVIX) 75 MG tablet Take 1 tablet (75 mg total) by mouth daily.  04/01/18   Daisy Floro, DO  diphenhydramine-acetaminophen (TYLENOL PM) 25-500 MG TABS tablet Take 1 tablet by mouth at bedtime as needed. 04/01/18   Daisy Floro, DO  DULoxetine (CYMBALTA) 60 MG capsule Take 1 capsule (60 mg total) by mouth daily. 04/01/18   Daisy Floro, DO  folic acid (FOLVITE) 1 MG tablet Take 1 tablet (1 mg total) by mouth daily. 04/01/18   Daisy Floro, DO  hydrOXYzine (ATARAX/VISTARIL) 25 MG tablet Take 1 tablet (25 mg total) by mouth every 8 (eight) hours as needed for itching. 04/01/18   Daisy Floro, DO  levETIRAcetam (KEPPRA) 500 MG tablet Take 1 tablet (500 mg total) by mouth 2 (two) times daily. 04/01/18   Daisy Floro, DO  losartan (COZAAR) 25 MG tablet Take 1 tablet (25 mg total) by mouth daily. 04/02/18   Daisy Floro, DO  memantine (NAMENDA) 10 MG tablet Take 1 tablet (10 mg total) by mouth 2 (two) times daily. 04/01/18   Daisy Floro, DO  mometasone-formoterol (DULERA) 200-5 MCG/ACT AERO Inhale 2 puffs into the lungs 2 (two) times daily. 04/01/18   Daisy Floro, DO  nicotine (NICODERM CQ - DOSED IN MG/24 HOURS) 21 mg/24hr patch Place 1 patch (21 mg total) onto the skin daily. 04/01/18   Daisy Floro, DO  pantoprazole (PROTONIX) 40 MG tablet Take 1 tablet (40 mg total) by mouth daily. 04/01/18   Daisy Floro, DO  pregabalin (LYRICA) 150 MG capsule Take 1 capsule (150 mg total) by mouth 2 (two) times daily. 04/01/18   Daisy Floro, DO  WIXELA INHUB 250-50 MCG/DOSE AEPB Inhale 1-2 puffs into the lungs daily. 04/01/18   Daisy Floro, DO    Family History Family History  Problem Relation Age of Onset   Heart attack Mother    Heart failure Mother    Cirrhosis Father    Heart failure Brother    Cancer Brother    Hypertension Neg Hx        UNKNOWN   Stroke Neg Hx        UNKNOWN    Social History Social History   Tobacco Use   Smoking status: Current Every Day Smoker    Packs/day: 0.50     Years: 50.00    Pack years: 25.00    Types: Cigars, Cigarettes  Start date: 02/09/1961   Smokeless tobacco: Former Systems developer    Types: Chew   Tobacco comment: 2 ppd, full flavor  Substance Use Topics   Alcohol use: Yes    Comment: occasionally, "I might drink 1 every 6 months"   Drug use: Yes    Types: Cocaine    Comment: "I smoke crack every once in a while"; last smoked yesterday    Allergies   Darvocet [propoxyphene n-acetaminophen], Haldol [haloperidol decanoate], Acetaminophen, Metformin and related, and Norco [hydrocodone-acetaminophen]   Review of Systems Review of Systems   All systems reviewed and negative, other than as noted in HPI.  Physical Exam Updated Vital Signs Temp 98.8 F (37.1 C) (Oral)    Wt 98 kg    BMI 29.30 kg/m   Physical Exam Vitals signs and nursing note reviewed.  Constitutional:      General: He is not in acute distress.    Appearance: He is well-developed.     Comments: Laying in bed. Appears tired but in NAD.   HENT:     Head: Normocephalic and atraumatic.  Eyes:     General:        Right eye: No discharge.        Left eye: No discharge.     Conjunctiva/sclera: Conjunctivae normal.  Neck:     Musculoskeletal: Neck supple.  Cardiovascular:     Rate and Rhythm: Normal rate and regular rhythm.     Heart sounds: Normal heart sounds. No murmur. No friction rub. No gallop.   Pulmonary:     Effort: Pulmonary effort is normal. No respiratory distress.     Breath sounds: Normal breath sounds.  Abdominal:     General: There is no distension.     Palpations: Abdomen is soft.     Tenderness: There is no abdominal tenderness.  Musculoskeletal:        General: No tenderness.  Skin:    General: Skin is warm and dry.  Neurological:     Mental Status: He is alert.     Comments: Mild dysarthria but understandable. Answering questions and following commands. I do not appreciate a facial droop but has significant facial hair. L hemiparesis.  Can raise R arm and leg off bed.   Psychiatric:        Behavior: Behavior normal.        Thought Content: Thought content normal.      ED Treatments / Results  Labs (all labs ordered are listed, but only abnormal results are displayed) Labs Reviewed  CBC - Abnormal; Notable for the following components:      Result Value   RBC 3.07 (*)    Hemoglobin 5.9 (*)    HCT 22.9 (*)    MCV 74.6 (*)    MCH 19.2 (*)    MCHC 25.8 (*)    RDW 17.7 (*)    nRBC 0.3 (*)    All other components within normal limits  COMPREHENSIVE METABOLIC PANEL - Abnormal; Notable for the following components:   Potassium 3.2 (*)    Chloride 113 (*)    Glucose, Bld 189 (*)    Total Protein 5.9 (*)    Albumin 3.2 (*)    All other components within normal limits  I-STAT CHEM 8, ED - Abnormal; Notable for the following components:   Potassium 3.2 (*)    BUN 6 (*)    Glucose, Bld 183 (*)    Calcium, Ion 1.10 (*)    Hemoglobin 6.8 (*)  HCT 20.0 (*)    All other components within normal limits  CBG MONITORING, ED - Abnormal; Notable for the following components:   Glucose-Capillary 167 (*)    All other components within normal limits  ETHANOL  PROTIME-INR  APTT  DIFFERENTIAL  RAPID URINE DRUG SCREEN, HOSP PERFORMED  URINALYSIS, ROUTINE W REFLEX MICROSCOPIC  OCCULT BLOOD X 1 CARD TO LAB, STOOL  TYPE AND SCREEN  PREPARE RBC (CROSSMATCH)    EKG EKG Interpretation  Date/Time:  Monday August 22 2018 15:25:43 EDT Ventricular Rate:  94 PR Interval:    QRS Duration: 104 QT Interval:  338 QTC Calculation: 423 R Axis:   61 Text Interpretation:  Atrial fibrillation Nonspecific T abnrm, anterolateral leads Confirmed by Virgel Manifold [phone]) on 08/22/2018 4:10:05 PM   Radiology Ct Angio Head W Or Wo Contrast  Result Date: 08/22/2018 CLINICAL DATA:  Right-sided weakness and slurred speech. EXAM: CT ANGIOGRAPHY HEAD AND NECK CT PERFUSION BRAIN TECHNIQUE: Multidetector CT imaging of the head and neck was  performed using the standard protocol during bolus administration of intravenous contrast. Multiplanar CT image reconstructions and MIPs were obtained to evaluate the vascular anatomy. Carotid stenosis measurements (when applicable) are obtained utilizing NASCET criteria, using the distal internal carotid diameter as the denominator. Multiphase CT imaging of the brain was performed following IV bolus contrast injection. Subsequent parametric perfusion maps were calculated using RAPID software. CONTRAST:  70mL OMNIPAQUE IOHEXOL 350 MG/ML SOLN COMPARISON:  03/25/2018.  December 2019. head CT earlier today. FINDINGS: CTA NECK FINDINGS Aortic arch: Advanced aortic atherosclerosis with irregular plaque particularly at the arch. Branching pattern is normal. 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 Right carotid system: Common carotid artery shows extensive soft and calcified plaque but is sufficiently patent to the distal common carotid artery where there is a carotid artery stent. There is flow in the external carotid artery but no flow in the internal carotid artery due to chronic occlusion at this level. Left carotid system: Common carotid artery shows some atherosclerotic plaque along its course but no stenosis proximal to the bifurcation. There is calcified plaque at the carotid bifurcation and ICA bulb. Minimal diameter is 4 mm, the same as the more distal cervical ICA, therefore no stenosis. Vertebral arteries: Right vertebral artery origin shows calcified plaque with stenosis of 50%. Left vertebral artery origin shows calcified plaque with stenosis also estimated at 50%. Both vertebral arteries are patent through the cervical region, with foci of atherosclerotic calcification resulting in 30-50% narrowing. No critical vertebral stenosis is seen. Skeleton: Ordinary spondylosis.  Chronic fusion C2-3. Other neck: No mass or lymphadenopathy. Upper chest: 2 cm spiculated mass in the  right upper lobe highly likely to represent lung carcinoma. This has not changed much since February but was not present in 2014. Review of the MIP images confirms the above findings CTA HEAD FINDINGS Anterior circulation: Left internal carotid artery is patent through the skull base and siphon region. There is siphon atherosclerotic calcification with stenosis estimated at 50%. Left anterior and middle cerebral vessels show flow, with mild atherosclerotic irregularity but no evidence of large or medium vessel occlusion. The right internal carotid artery does not show flow at the skull base. There is a stent of the carotid siphon region. There is reconstitution of the supraclinoid ICA probably from a combination of external to internal collaterals and flow through a patent anterior communicating artery. Flow is present within the right anterior and middle cerebral vessels, though the caliber  is diminished when compared to the right side. No change since the previous study. Posterior circulation: Both vertebral arteries are patent through the foramen magnum to the basilar. No basilar stenosis. Posterior circulation branch vessels are patent. Atherosclerotic irregularity of the PCA branches. Venous sinuses: Patent and normal. Anatomic variants: None significant. Delayed phase: Not performed. Review of the MIP images confirms the above findings CT Brain Perfusion Findings: Severely motion degraded exam, without useful data because of this. IMPRESSION: No large or medium vessel occlusion identified acutely. Perfusion imaging is severely degraded by motion and not useful. Advanced chronic atherosclerotic disease of the aorta. 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 Right carotid siphon stent without proximal flow. Flow in the left anterior and middle cerebral arteries likely due to a patent anterior communicating artery. Diminished caliber  related to diminished flow volume. No acute finding however. Left carotid bifurcation atherosclerotic disease with no measurable stenosis. No left anterior circulation large or medium vessel occlusion. 50% stenoses at both vertebral artery origins. Serial focal stenoses throughout both vertebral arteries, but without critical stenosis. Perfusion study suffers from severe motion and is not usable. 2 cm spiculated mass in the right upper lobe consistent with lung carcinoma. Little change since February. However, the lesion was not present in 2014. These results were communicated to Dr. Rory Percy at 3:45 pmon 7/13/2020by text page via the [facility] messaging system. Electronically Signed   By: Nelson Chimes M.D.   On: 08/22/2018 15:56   Ct Angio Neck W Or Wo Contrast  Result Date: 08/22/2018 CLINICAL DATA:  Right-sided weakness and slurred speech. EXAM: CT ANGIOGRAPHY HEAD AND NECK CT PERFUSION BRAIN TECHNIQUE: Multidetector CT imaging of the head and neck was performed using the standard protocol during bolus administration of intravenous contrast. Multiplanar CT image reconstructions and MIPs were obtained to evaluate the vascular anatomy. Carotid stenosis measurements (when applicable) are obtained utilizing NASCET criteria, using the distal internal carotid diameter as the denominator. Multiphase CT imaging of the brain was performed following IV bolus contrast injection. Subsequent parametric perfusion maps were calculated using RAPID software. CONTRAST:  70mL OMNIPAQUE IOHEXOL 350 MG/ML SOLN COMPARISON:  03/25/2018.  December 2019. head CT earlier today. FINDINGS: CTA NECK FINDINGS Aortic arch: Advanced aortic atherosclerosis with irregular plaque particularly at the arch. Branching pattern is normal. 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 Right carotid system: Common carotid artery shows extensive soft and calcified plaque but is sufficiently patent to the distal common carotid  artery where there is a carotid artery stent. There is flow in the external carotid artery but no flow in the internal carotid artery due to chronic occlusion at this level. Left carotid system: Common carotid artery shows some atherosclerotic plaque along its course but no stenosis proximal to the bifurcation. There is calcified plaque at the carotid bifurcation and ICA bulb. Minimal diameter is 4 mm, the same as the more distal cervical ICA, therefore no stenosis. Vertebral arteries: Right vertebral artery origin shows calcified plaque with stenosis of 50%. Left vertebral artery origin shows calcified plaque with stenosis also estimated at 50%. Both vertebral arteries are patent through the cervical region, with foci of atherosclerotic calcification resulting in 30-50% narrowing. No critical vertebral stenosis is seen. Skeleton: Ordinary spondylosis.  Chronic fusion C2-3. Other neck: No mass or lymphadenopathy. Upper chest: 2 cm spiculated mass in the right upper lobe highly likely to represent lung carcinoma. This has  not changed much since February but was not present in 2014. Review of the MIP images confirms the above findings CTA HEAD FINDINGS Anterior circulation: Left internal carotid artery is patent through the skull base and siphon region. There is siphon atherosclerotic calcification with stenosis estimated at 50%. Left anterior and middle cerebral vessels show flow, with mild atherosclerotic irregularity but no evidence of large or medium vessel occlusion. The right internal carotid artery does not show flow at the skull base. There is a stent of the carotid siphon region. There is reconstitution of the supraclinoid ICA probably from a combination of external to internal collaterals and flow through a patent anterior communicating artery. Flow is present within the right anterior and middle cerebral vessels, though the caliber is diminished when compared to the right side. No change since the previous  study. Posterior circulation: Both vertebral arteries are patent through the foramen magnum to the basilar. No basilar stenosis. Posterior circulation branch vessels are patent. Atherosclerotic irregularity of the PCA branches. Venous sinuses: Patent and normal. Anatomic variants: None significant. Delayed phase: Not performed. Review of the MIP images confirms the above findings CT Brain Perfusion Findings: Severely motion degraded exam, without useful data because of this. IMPRESSION: No large or medium vessel occlusion identified acutely. Perfusion imaging is severely degraded by motion and not useful. Advanced chronic atherosclerotic disease of the aorta. 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 Right carotid siphon stent without proximal flow. Flow in the left anterior and middle cerebral arteries likely due to a patent anterior communicating artery. Diminished caliber related to diminished flow volume. No acute finding however. Left carotid bifurcation atherosclerotic disease with no measurable stenosis. No left anterior circulation large or medium vessel occlusion. 50% stenoses at both vertebral artery origins. Serial focal stenoses throughout both vertebral arteries, but without critical stenosis. Perfusion study suffers from severe motion and is not usable. 2 cm spiculated mass in the right upper lobe consistent with lung carcinoma. Little change since February. However, the lesion was not present in 2014. These results were communicated to Dr. Rory Percy at 3:45 pmon 7/13/2020by text page via the [facility] messaging system. Electronically Signed   By: Nelson Chimes M.D.   On: 08/22/2018 15:56   Ct Cerebral Perfusion W Contrast  Result Date: 08/22/2018 CLINICAL DATA:  Right-sided weakness and slurred speech. EXAM: CT ANGIOGRAPHY HEAD AND NECK CT PERFUSION BRAIN TECHNIQUE: Multidetector CT imaging of the head and neck was performed using  the standard protocol during bolus administration of intravenous contrast. Multiplanar CT image reconstructions and MIPs were obtained to evaluate the vascular anatomy. Carotid stenosis measurements (when applicable) are obtained utilizing NASCET criteria, using the distal internal carotid diameter as the denominator. Multiphase CT imaging of the brain was performed following IV bolus contrast injection. Subsequent parametric perfusion maps were calculated using RAPID software. CONTRAST:  75mL OMNIPAQUE IOHEXOL 350 MG/ML SOLN COMPARISON:  03/25/2018.  December 2019. head CT earlier today. FINDINGS: CTA NECK FINDINGS Aortic arch: Advanced aortic atherosclerosis with irregular plaque particularly at the arch. Branching pattern is normal. 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 Right carotid system: Common carotid artery shows extensive soft and calcified plaque but is sufficiently patent to the distal common carotid artery where there is a carotid artery stent. There is flow in the external carotid artery but no flow in the internal carotid artery due to chronic occlusion at this level. Left carotid  system: Common carotid artery shows some atherosclerotic plaque along its course but no stenosis proximal to the bifurcation. There is calcified plaque at the carotid bifurcation and ICA bulb. Minimal diameter is 4 mm, the same as the more distal cervical ICA, therefore no stenosis. Vertebral arteries: Right vertebral artery origin shows calcified plaque with stenosis of 50%. Left vertebral artery origin shows calcified plaque with stenosis also estimated at 50%. Both vertebral arteries are patent through the cervical region, with foci of atherosclerotic calcification resulting in 30-50% narrowing. No critical vertebral stenosis is seen. Skeleton: Ordinary spondylosis.  Chronic fusion C2-3. Other neck: No mass or lymphadenopathy. Upper chest: 2 cm spiculated mass in the right upper lobe  highly likely to represent lung carcinoma. This has not changed much since February but was not present in 2014. Review of the MIP images confirms the above findings CTA HEAD FINDINGS Anterior circulation: Left internal carotid artery is patent through the skull base and siphon region. There is siphon atherosclerotic calcification with stenosis estimated at 50%. Left anterior and middle cerebral vessels show flow, with mild atherosclerotic irregularity but no evidence of large or medium vessel occlusion. The right internal carotid artery does not show flow at the skull base. There is a stent of the carotid siphon region. There is reconstitution of the supraclinoid ICA probably from a combination of external to internal collaterals and flow through a patent anterior communicating artery. Flow is present within the right anterior and middle cerebral vessels, though the caliber is diminished when compared to the right side. No change since the previous study. Posterior circulation: Both vertebral arteries are patent through the foramen magnum to the basilar. No basilar stenosis. Posterior circulation branch vessels are patent. Atherosclerotic irregularity of the PCA branches. Venous sinuses: Patent and normal. Anatomic variants: None significant. Delayed phase: Not performed. Review of the MIP images confirms the above findings CT Brain Perfusion Findings: Severely motion degraded exam, without useful data because of this. IMPRESSION: No large or medium vessel occlusion identified acutely. Perfusion imaging is severely degraded by motion and not useful. Advanced chronic atherosclerotic disease of the aorta. 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 Right carotid siphon stent without proximal flow. Flow in the left anterior and middle cerebral arteries likely due to a patent anterior communicating artery. Diminished caliber related to diminished  flow volume. No acute finding however. Left carotid bifurcation atherosclerotic disease with no measurable stenosis. No left anterior circulation large or medium vessel occlusion. 50% stenoses at both vertebral artery origins. Serial focal stenoses throughout both vertebral arteries, but without critical stenosis. Perfusion study suffers from severe motion and is not usable. 2 cm spiculated mass in the right upper lobe consistent with lung carcinoma. Little change since February. However, the lesion was not present in 2014. These results were communicated to Dr. Rory Percy at 3:45 pmon 7/13/2020by text page via the [facility] messaging system. Electronically Signed   By: Nelson Chimes M.D.   On: 08/22/2018 15:56   Ct Head Code Stroke Wo Contrast  Result Date: 08/22/2018 CLINICAL DATA:  Code stroke. Altered level of consciousness. Right-sided deficit. Slurred speech EXAM: CT HEAD WITHOUT CONTRAST TECHNIQUE: Contiguous axial images were obtained from the base of the skull through the vertex without intravenous contrast. COMPARISON:  CT head 01/12/2018 FINDINGS: Brain: Mild atrophy. Ventricle size normal. Chronic right MCA infarct over the convexity is unchanged. Negative for acute infarct, hemorrhage, or mass.  No midline  shift. Vascular: Negative for hyperdense vessel Skull: Negative Sinuses/Orbits: Surgical repair of left orbital floor fracture. Mucosal edema left maxillary sinus. Bilateral cataract surgery. Other: None ASPECTS ([place] Stroke Program Early CT Score) - Ganglionic level infarction (caudate, lentiform nuclei, internal capsule, insula, M1-M3 cortex): 7 - Supraganglionic infarction (M4-M6 cortex): 3 Total score (0-10 with 10 being normal): 10 IMPRESSION: 1. No acute abnormality 2. Chronic right MCA infarct 3. ASPECTS is 10 4. These results were called by telephone at the time of interpretation on 08/22/2018 at 2:45 pm to Dr. Rory Percy, who verbally acknowledged these results. Electronically Signed   By: Franchot Gallo M.D.   On: 08/22/2018 14:45    Procedures Procedures (including critical care time)  CRITICAL CARE Performed by: Virgel Manifold Total critical care time: 35 minutes Critical care time was exclusive of separately billable procedures and treating other patients. Critical care was necessary to treat or prevent imminent or life-threatening deterioration. Critical care was time spent personally by me on the following activities: development of treatment plan with patient and/or surrogate as well as nursing, discussions with consultants, evaluation of patient's response to treatment, examination of patient, obtaining history from patient or surrogate, ordering and performing treatments and interventions, ordering and review of laboratory studies, ordering and review of radiographic studies, pulse oximetry and re-evaluation of patient's condition.   Medications Ordered in ED Medications  0.9 %  sodium chloride infusion (has no administration in time range)  iohexol (OMNIPAQUE) 350 MG/ML injection 100 mL (90 mLs Intravenous Contrast Given 08/22/18 1441)     Initial Impression / Assessment and Plan / ED Course  I have reviewed the triage vital signs and the nursing notes.  Pertinent labs & imaging results that were available during my care of the patient were reviewed by me and considered in my medical decision making (see chart for details).     68yM with R facial droop and slurred speech. Chronic deficits from prior CVA. Noted to be anemic. Symptoms felt to be from anemia in setting of known underlying vascular disease. Unclear why more anemic. Check hemoccult. BUN only 6 arguing against UGIB. He is on ASA/plavix. Transfuse PRBC. Admit.   Final Clinical Impressions(s) / ED Diagnoses   Final diagnoses:  Symptomatic anemia    ED Discharge Orders    None       Virgel Manifold, MD 08/22/18 [phone]

## 2018-08-22 NOTE — ED Notes (Signed)
ED TO INPATIENT HANDOFF REPORT  ED Nurse Name and Phone #: Annie Main [phone]  S Name/Age/Gender [name] 68 y.o. male Room/Bed: 035C/035C  Code Status   Code Status: Prior  Home/SNF/Other Home Patient oriented to: self, place, time and situation Is this baseline? Yes   Triage Complete: Triage complete  Chief Complaint CODE STROKE  Triage Note Pt BIB [facility] d/t  int'l low HCT, BP. Ems noticed R.facial droop w/ slurred speech. Pt LKW 1000 this AM. Hx CVA w/ residual L.sided weakness   Allergies Allergies  Allergen Reactions  . Darvocet [Propoxyphene N-Acetaminophen] Hives  . Haldol [Haloperidol Decanoate] Hives  . Acetaminophen Nausea Only    Upset stomach, tolerates Hydrocodone/APAP if taken with food  . Metformin And Related Nausea And Vomiting  . Norco [Hydrocodone-Acetaminophen] Nausea And Vomiting    Tolerates if taken with food    Level of Care/Admitting Diagnosis ED Disposition    ED Disposition Condition [facility] Area: [place] [[id number]]  Level of Care: Telemetry Medical [104]  Covid Evaluation: N/A  Diagnosis: Symptomatic anemia [[id number]]  Admitting Physician: Rory Percy [[id number]]  Attending Physician: Martyn Malay [[id number]]  Estimated length of stay: past midnight tomorrow  Certification:: I certify this patient will need inpatient services for at least 2 midnights  PT Class (Do Not Modify): Inpatient [101]  PT Acc Code (Do Not Modify): Private [1]       B Medical/Surgery History Past Medical History:  Diagnosis Date  . Acute blood loss anemia   . Acute CVA (cerebrovascular accident) ([place]) 08/27/2014  . Acute encephalopathy 11/28/2017  . Acute ischemic stroke ([place])   . AKI (acute kidney injury) ([place])   . Alcohol abuse    H/O  . Alcohol dependence with withdrawal with complication ([place]) 05/19/7351  . Anxiety   . Arterial ischemic stroke, MCA (middle cerebral artery), right, acute ([place])   .  Arthritis   . Asthma   . Binocular vision disorder with diplopia 01/03/2013  . CAD (coronary artery disease) 05/27/10   Cath: severe single vessell CAD left cx midportion obtuse marginal 2 to 3.  . Cerebral infarction due to embolism of right middle cerebral artery ([place])   . Cerebral infarction due to stenosis of right carotid artery ([place]) 11/01/2014  . Cerebral thrombosis with cerebral infarction 07/18/2016  . Cerebrovascular accident (CVA) due to stenosis of right carotid artery ([place])   . CHF (congestive heart failure) ([place])   . Closed blow-out fracture of floor of orbit ([place]) 01/19/2013  . COPD (chronic obstructive pulmonary disease) ([place])   . COPD (chronic obstructive pulmonary disease) ([place])   . CVA (cerebral vascular accident) ([place]) 08/26/2014  . Dementia due to another medical condition ([place]) 07/12/2015  . Depression   . Diabetes mellitus type 2 in obese ([place])   . Diabetes mellitus, type 2 ([place]) 03/13/2015  . Diastolic CHF, acute on chronic (HCC) 11/01/2014  . DOE (dyspnea on exertion) 11/23/2014  . DVT (deep venous thrombosis) ([place]) 02/22/2014  . Embolic stroke ([place])   . Enophthalmos due to trauma 01/19/2013  . Eyelid lesion 01/24/2013  . Fracture of inferior orbital wall ([place]) 01/03/2013  . Fracture of orbital floor ([place]) 01/03/2013  . GERD (gastroesophageal reflux disease)   . H/O total knee replacement 10/24/2014  . HCAP (healthcare-associated pneumonia) 10/15/2015  . Headache around the eyes 08/27/2014  . History of DVT (deep vein thrombosis)   . History of DVT of lower extremity   . Hyperkalemia  10/24/2014  . Hyperlipemia   . Hyperlipidemia   . Hypertension   . Hypoalbuminemia due to protein-calorie malnutrition ([place])   . Hypotension   . Labile blood pressure   . Left shoulder pain 09/28/2017  . Lower extremity edema 02/21/2014  . Medial orbital wall fracture 01/19/2013  . Myocardial infarction ([place]) 2000  . Orbital fracture 12/2012  . OSA on CPAP   . Pain of left eye  01/03/2013  . Peripheral vascular disease, unspecified ([place])    08/20/10 doppler: increase in right ABI post-op. Left ABI stable. S/P bi-fem bypass surgery  . Pneumonia 04/04/2012  . Right middle cerebral artery stroke ([place]) 01/18/2018  . Seizures ([place])   . Shortness of breath   . Stroke (cerebrum) ([place]) 02/06/2015  . Stroke ([place])   . Thrombocytopenia ([place])   . Tobacco abuse   . Trichiasis without entropion 04/16/2013   Overview:  LLL central   . Type 2 diabetes mellitus with peripheral neuropathy ([place]) 03/13/2015  . Urinary incontinence 03/14/2015  . Urinary incontinence 03/14/2015   Past Surgical History:  Procedure Laterality Date  . abdoninal ao angio & bifem angio  05/27/10   Patent graft, occluded bil stents with no retrograde flow into the hypogastric arteries. 100% occl left ant. tibial artery, 70% to 80% to 100% stenosis right superficial fem artery above adductor canal. 100 % occl right ant. tibial vessell  . Aortogram w/ PTCA  09/292003  . BACK SURGERY    . CARDIAC CATHETERIZATION  05/27/10   severe CAD left cx  . CHOLECYSTECTOMY    . ESOPHAGOGASTRIC FUNDOPLICATION    . EYE SURGERY    . FEMORAL BYPASS  08/19/10   Right Fem-Pop  . IR ANGIO INTRA EXTRACRAN SEL COM CAROTID INNOMINATE BILAT MOD SED  07/20/2016  . IR ANGIO INTRA EXTRACRAN SEL COM CAROTID INNOMINATE BILAT MOD SED  01/14/2018  . IR ANGIO VERTEBRAL SEL SUBCLAVIAN INNOMINATE UNI R MOD SED  07/20/2016  . IR ANGIO VERTEBRAL SEL VERTEBRAL BILAT MOD SED  01/14/2018  . IR ANGIO VERTEBRAL SEL VERTEBRAL UNI L MOD SED  07/20/2016  . IR RADIOLOGIST EVAL & MGMT  05/27/2016  . IR US GUIDE VASC ACCESS RIGHT  01/14/2018  . JOINT REPLACEMENT    . PERIPHERAL VASCULAR CATHETERIZATION N/A 12/10/2014   Procedure: Abdominal Aortogram;  Surgeon: Angelia Mould, MD;  Location: [facility];  Service: Cardiovascular;  Laterality: N/A;  . RADIOLOGY WITH ANESTHESIA N/A 02/08/2015   Procedure: RADIOLOGY WITH ANESTHESIA;  Surgeon: Luanne Bras, MD;  Location: [facility];  Service: Radiology;  Laterality: N/A;  . TEE WITHOUT CARDIOVERSION N/A 08/29/2014   Procedure: TRANSESOPHAGEAL ECHOCARDIOGRAM (TEE);  Surgeon: Josue Hector, MD;  Location: [facility];  Service: Cardiovascular;  Laterality: N/A;  . TOTAL KNEE ARTHROPLASTY       A IV Location/Drains/Wounds Patient Lines/Drains/Airways Status   Active Line/Drains/Airways    Name:   Placement date:   Placement time:   Site:   Days:   Peripheral IV 08/22/18 Right Forearm   08/22/18    -    Forearm   less than 1   Peripheral IV 08/22/18 Right Antecubital   08/22/18    1600    Antecubital   less than 1          Intake/Output Last 24 hours  Intake/Output Summary (Last 24 hours) at 08/22/2018 2039 Last data filed at 08/22/2018 1930 Gross per 24 hour  Intake 315 ml  Output 400 ml  Net -  85 ml    Labs/Imaging Results for orders placed or performed during the hospital encounter of 08/22/18 (from the past 48 hour(s))  Ethanol     Status: None   Collection Time: 08/22/18  2:30 PM  Result Value Ref Range   Alcohol, Ethyl (B) <10 <10 mg/dL    Comment: (NOTE) Lowest detectable limit for serum alcohol is 10 mg/dL. For medical purposes only. Performed at [facility], [place] [address] [place], [place] [address]   Protime-INR     Status: None   Collection Time: 08/22/18  2:30 PM  Result Value Ref Range   Prothrombin Time 14.9 11.4 - 15.2 seconds   INR 1.2 0.8 - 1.2    Comment: (NOTE) INR goal varies based on device and disease states. Performed at [facility], [place] [address], [place], [place] [address]   APTT     Status: None   Collection Time: 08/22/18  2:30 PM  Result Value Ref Range   aPTT 29 24 - 36 seconds    Comment: Performed at [facility] [address] [place], [place] [address]  CBC     Status: Abnormal   Collection Time: 08/22/18  2:30 PM  Result Value Ref Range   WBC 5.9 4.0 - 10.5 K/uL   RBC 3.07 (L) 4.22 - 5.81 MIL/uL    Hemoglobin 5.9 (LL) 13.0 - 17.0 g/dL    Comment: REPEATED TO VERIFY Reticulocyte Hemoglobin testing may be clinically indicated, consider ordering this additional test [id number] THIS CRITICAL RESULT HAS VERIFIED AND BEEN CALLED TO BREA DAVIS RN BY SHANNON FLEMING ON 07 13 2020 AT 1444, AND HAS BEEN READ BACK.     HCT 22.9 (L) 39.0 - 52.0 %   MCV 74.6 (L) 80.0 - 100.0 fL   MCH 19.2 (L) 26.0 - 34.0 pg   MCHC 25.8 (L) 30.0 - 36.0 g/dL   RDW 17.7 (H) 11.5 - 15.5 %   Platelets 214 150 - 400 K/uL   nRBC 0.3 (H) 0.0 - 0.2 %    Comment: Performed at [facility] [address] [place], [place] [address]  Differential     Status: None   Collection Time: 08/22/18  2:30 PM  Result Value Ref Range   Neutrophils Relative % 58 %   Neutro Abs 3.5 1.7 - 7.7 K/uL   Lymphocytes Relative 21 %   Lymphs Abs 1.2 0.7 - 4.0 K/uL   Monocytes Relative 12 %   Monocytes Absolute 0.7 0.1 - 1.0 K/uL   Eosinophils Relative 7 %   Eosinophils Absolute 0.4 0.0 - 0.5 K/uL   Basophils Relative 1 %   Basophils Absolute 0.1 0.0 - 0.1 K/uL   Immature Granulocytes 1 %   Abs Immature Granulocytes 0.04 0.00 - 0.07 K/uL    Comment: Performed at [facility] [address] [place], [place] [address]  Comprehensive metabolic panel     Status: Abnormal   Collection Time: 08/22/18  2:30 PM  Result Value Ref Range   Sodium 144 135 - 145 mmol/L   Potassium 3.2 (L) 3.5 - 5.1 mmol/L   Chloride 113 (H) 98 - 111 mmol/L   CO2 22 22 - 32 mmol/L   Glucose, Bld 189 (H) 70 - 99 mg/dL   BUN 8 8 - 23 mg/dL   Creatinine, Ser 0.84 0.61 - 1.24 mg/dL   Calcium 9.4 8.9 - 10.3 mg/dL   Total Protein 5.9 (L) 6.5 -  8.1 g/dL   Albumin 3.2 (L) 3.5 - 5.0 g/dL   AST 15 15 - 41 U/L   ALT 11 0 - 44 U/L   Alkaline Phosphatase 67 38 - 126 U/L   Total Bilirubin 0.8 0.3 - 1.2 mg/dL   GFR calc non Af Amer >60 >60 mL/min   GFR calc Af Amer >60 >60 mL/min   Anion gap 9 5 - 15    Comment: Performed at [facility] [address] [place], [place] [address]  CBG monitoring, ED     Status: Abnormal   Collection Time: 08/22/18  2:30 PM  Result Value Ref Range   Glucose-Capillary 167 (H) 70 - 99 mg/dL  I-stat chem 8, ED     Status: Abnormal   Collection Time: 08/22/18  2:42 PM  Result Value Ref Range   Sodium 145 135 - 145 mmol/L   Potassium 3.2 (L) 3.5 - 5.1 mmol/L   Chloride 110 98 - 111 mmol/L   BUN 6 (L) 8 - 23 mg/dL   Creatinine, Ser 0.70 0.61 - 1.24 mg/dL   Glucose, Bld 183 (H) 70 - 99 mg/dL   Calcium, Ion 1.10 (L) 1.15 - 1.40 mmol/L   TCO2 25 22 - 32 mmol/L   Hemoglobin 6.8 (LL) 13.0 - 17.0 g/dL   HCT 20.0 (L) 39.0 - 52.0 %   Comment NOTIFIED PHYSICIAN   Type and screen [place]     Status: None (Preliminary result)   Collection Time: 08/22/18  3:51 PM  Result Value Ref Range   ABO/RH(D) O POS    Antibody Screen NEG    Sample Expiration 08/25/2018,2359    Unit Number [id number]    Blood Component Type RED CELLS,LR    Unit division 00    Status of Unit ALLOCATED    Transfusion Status OK TO TRANSFUSE    Crossmatch Result Compatible    Unit Number [id number]    Blood Component Type RED CELLS,LR    Unit division 00    Status of Unit ISSUED    Transfusion Status OK TO TRANSFUSE    Crossmatch Result      Compatible Performed at [facility], [address], [place], [place] [address]   Prepare RBC     Status: None   Collection Time: 08/22/18  3:55 PM  Result Value Ref Range   Order Confirmation      ORDER PROCESSED BY BLOOD BANK Performed at [facility], [place] [address], [place], [place] [address]   Vitamin B12     Status: None   Collection Time: 08/22/18  4:38 PM  Result Value Ref Range   Vitamin B-12 323 180 - 914 pg/mL    Comment: (NOTE) This assay is not validated for testing neonatal or myeloproliferative syndrome specimens for Vitamin B12 levels. Performed at [facility], [place] [address] [place], [place] [address]   Iron and TIBC      Status: None   Collection Time: 08/22/18  4:38 PM  Result Value Ref Range   Iron 114 45 - 182 ug/dL   TIBC 388 250 - 450 ug/dL   Saturation Ratios 29 17.9 - 39.5 %   UIBC 274 ug/dL    Comment: Performed at [facility] [address] [place], [place] [address]  Ferritin     Status: Abnormal   Collection Time: 08/22/18  4:38 PM  Result Value Ref Range   Ferritin 8 (  L) 24 - 336 ng/mL    Comment: Performed at [facility], [place] [address], [place], [place] [address]  Reticulocytes     Status: Abnormal   Collection Time: 08/22/18  4:38 PM  Result Value Ref Range   Retic Ct Pct 2.6 0.4 - 3.1 %   RBC. 3.06 (L) 4.22 - 5.81 MIL/uL   Retic Count, Absolute 78.9 19.0 - 186.0 K/uL   Immature Retic Fract 28.6 (H) 2.3 - 15.9 %    Comment: Performed at [facility] [address] [place], [place] [address]  POC urine preg, ED (not at [facility])     Status: Abnormal   Collection Time: 08/22/18  5:25 PM  Result Value Ref Range   Preg Test, Ur POSITIVE (A) NEGATIVE    Comment:        THE SENSITIVITY OF THIS METHODOLOGY IS >24 mIU/mL   POC occult blood, ED     Status: Abnormal   Collection Time: 08/22/18  5:33 PM  Result Value Ref Range   Fecal Occult Bld POSITIVE (A) NEGATIVE  Urine rapid drug screen (hosp performed)     Status: None   Collection Time: 08/22/18  7:00 PM  Result Value Ref Range   Opiates NONE DETECTED NONE DETECTED   Cocaine NONE DETECTED NONE DETECTED   Benzodiazepines NONE DETECTED NONE DETECTED   Amphetamines NONE DETECTED NONE DETECTED   Tetrahydrocannabinol NONE DETECTED NONE DETECTED   Barbiturates NONE DETECTED NONE DETECTED    Comment: (NOTE) DRUG SCREEN FOR MEDICAL PURPOSES ONLY.  IF CONFIRMATION IS NEEDED FOR ANY PURPOSE, NOTIFY LAB WITHIN 5 DAYS. LOWEST DETECTABLE LIMITS FOR URINE DRUG SCREEN Drug Class                     Cutoff (ng/mL) Amphetamine and metabolites    1000 Barbiturate and metabolites    200 Benzodiazepine                  010 Tricyclics and metabolites     300 Opiates and metabolites        300 Cocaine and metabolites        300 THC                            50 Performed at [facility], [place] [address], [place], [place] [address]   Urinalysis, Routine w reflex microscopic     Status: Abnormal   Collection Time: 08/22/18  7:00 PM  Result Value Ref Range   Color, Urine YELLOW YELLOW   APPearance HAZY (A) CLEAR   Specific Gravity, Urine 1.044 (H) 1.005 - 1.030   pH 5.0 5.0 - 8.0   Glucose, UA NEGATIVE NEGATIVE mg/dL   Hgb urine dipstick NEGATIVE NEGATIVE   Bilirubin Urine NEGATIVE NEGATIVE   Ketones, ur NEGATIVE NEGATIVE mg/dL   Protein, ur 30 (A) NEGATIVE mg/dL   Nitrite NEGATIVE NEGATIVE   Leukocytes,Ua NEGATIVE NEGATIVE   RBC / HPF 0-5 0 - 5 RBC/hpf   WBC, UA 0-5 0 - 5 WBC/hpf   Bacteria, UA NONE SEEN NONE SEEN   Squamous Epithelial / LPF 0-5 0 - 5   Mucus PRESENT    Hyaline Casts, UA PRESENT     Comment: Performed at [facility], [place] [address], [place], [place] [address]   Ct Angio Head W Or Wo Contrast  Result Date: 08/22/2018 CLINICAL DATA:  Right-sided weakness and  slurred speech. EXAM: CT ANGIOGRAPHY HEAD AND NECK CT PERFUSION BRAIN TECHNIQUE: Multidetector CT imaging of the head and neck was performed using the standard protocol during bolus administration of intravenous contrast. Multiplanar CT image reconstructions and MIPs were obtained to evaluate the vascular anatomy. Carotid stenosis measurements (when applicable) are obtained utilizing NASCET criteria, using the distal internal carotid diameter as the denominator. Multiphase CT imaging of the brain was performed following IV bolus contrast injection. Subsequent parametric perfusion maps were calculated using RAPID software. CONTRAST:  66mL OMNIPAQUE IOHEXOL 350 MG/ML SOLN COMPARISON:  03/25/2018.  December 2019. head CT earlier today. FINDINGS: CTA NECK FINDINGS Aortic arch: Advanced aortic atherosclerosis with irregular  plaque particularly at the arch. Branching pattern is normal. 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 Right carotid system: Common carotid artery shows extensive soft and calcified plaque but is sufficiently patent to the distal common carotid artery where there is a carotid artery stent. There is flow in the external carotid artery but no flow in the internal carotid artery due to chronic occlusion at this level. Left carotid system: Common carotid artery shows some atherosclerotic plaque along its course but no stenosis proximal to the bifurcation. There is calcified plaque at the carotid bifurcation and ICA bulb. Minimal diameter is 4 mm, the same as the more distal cervical ICA, therefore no stenosis. Vertebral arteries: Right vertebral artery origin shows calcified plaque with stenosis of 50%. Left vertebral artery origin shows calcified plaque with stenosis also estimated at 50%. Both vertebral arteries are patent through the cervical region, with foci of atherosclerotic calcification resulting in 30-50% narrowing. No critical vertebral stenosis is seen. Skeleton: Ordinary spondylosis.  Chronic fusion C2-3. Other neck: No mass or lymphadenopathy. Upper chest: 2 cm spiculated mass in the right upper lobe highly likely to represent lung carcinoma. This has not changed much since February but was not present in 2014. Review of the MIP images confirms the above findings CTA HEAD FINDINGS Anterior circulation: Left internal carotid artery is patent through the skull base and siphon region. There is siphon atherosclerotic calcification with stenosis estimated at 50%. Left anterior and middle cerebral vessels show flow, with mild atherosclerotic irregularity but no evidence of large or medium vessel occlusion. The right internal carotid artery does not show flow at the skull base. There is a stent of the carotid siphon region. There is reconstitution of the supraclinoid ICA probably  from a combination of external to internal collaterals and flow through a patent anterior communicating artery. Flow is present within the right anterior and middle cerebral vessels, though the caliber is diminished when compared to the right side. No change since the previous study. Posterior circulation: Both vertebral arteries are patent through the foramen magnum to the basilar. No basilar stenosis. Posterior circulation branch vessels are patent. Atherosclerotic irregularity of the PCA branches. Venous sinuses: Patent and normal. Anatomic variants: None significant. Delayed phase: Not performed. Review of the MIP images confirms the above findings CT Brain Perfusion Findings: Severely motion degraded exam, without useful data because of this. IMPRESSION: No large or medium vessel occlusion identified acutely. Perfusion imaging is severely degraded by motion and not useful. Advanced chronic atherosclerotic disease of the aorta. 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 Right carotid siphon stent without proximal flow. Flow in the left anterior and middle cerebral arteries likely due to a patent anterior communicating artery. Diminished caliber related to  diminished flow volume. No acute finding however. Left carotid bifurcation atherosclerotic disease with no measurable stenosis. No left anterior circulation large or medium vessel occlusion. 50% stenoses at both vertebral artery origins. Serial focal stenoses throughout both vertebral arteries, but without critical stenosis. Perfusion study suffers from severe motion and is not usable. 2 cm spiculated mass in the right upper lobe consistent with lung carcinoma. Little change since February. However, the lesion was not present in 2014. These results were communicated to Dr. Rory Percy at 3:45 pmon 7/13/2020by text page via the [name] messaging system. Electronically Signed   By: Nelson Chimes  M.D.   On: 08/22/2018 15:56   Ct Angio Neck W Or Wo Contrast  Result Date: 08/22/2018 CLINICAL DATA:  Right-sided weakness and slurred speech. EXAM: CT ANGIOGRAPHY HEAD AND NECK CT PERFUSION BRAIN TECHNIQUE: Multidetector CT imaging of the head and neck was performed using the standard protocol during bolus administration of intravenous contrast. Multiplanar CT image reconstructions and MIPs were obtained to evaluate the vascular anatomy. Carotid stenosis measurements (when applicable) are obtained utilizing NASCET criteria, using the distal internal carotid diameter as the denominator. Multiphase CT imaging of the brain was performed following IV bolus contrast injection. Subsequent parametric perfusion maps were calculated using RAPID software. CONTRAST:  20mL OMNIPAQUE IOHEXOL 350 MG/ML SOLN COMPARISON:  03/25/2018.  December 2019. head CT earlier today. FINDINGS: CTA NECK FINDINGS Aortic arch: Advanced aortic atherosclerosis with irregular plaque particularly at the arch. Branching pattern is normal. 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 Right carotid system: Common carotid artery shows extensive soft and calcified plaque but is sufficiently patent to the distal common carotid artery where there is a carotid artery stent. There is flow in the external carotid artery but no flow in the internal carotid artery due to chronic occlusion at this level. Left carotid system: Common carotid artery shows some atherosclerotic plaque along its course but no stenosis proximal to the bifurcation. There is calcified plaque at the carotid bifurcation and ICA bulb. Minimal diameter is 4 mm, the same as the more distal cervical ICA, therefore no stenosis. Vertebral arteries: Right vertebral artery origin shows calcified plaque with stenosis of 50%. Left vertebral artery origin shows calcified plaque with stenosis also estimated at 50%. Both vertebral arteries are patent through the cervical  region, with foci of atherosclerotic calcification resulting in 30-50% narrowing. No critical vertebral stenosis is seen. Skeleton: Ordinary spondylosis.  Chronic fusion C2-3. Other neck: No mass or lymphadenopathy. Upper chest: 2 cm spiculated mass in the right upper lobe highly likely to represent lung carcinoma. This has not changed much since February but was not present in 2014. Review of the MIP images confirms the above findings CTA HEAD FINDINGS Anterior circulation: Left internal carotid artery is patent through the skull base and siphon region. There is siphon atherosclerotic calcification with stenosis estimated at 50%. Left anterior and middle cerebral vessels show flow, with mild atherosclerotic irregularity but no evidence of large or medium vessel occlusion. The right internal carotid artery does not show flow at the skull base. There is a stent of the carotid siphon region. There is reconstitution of the supraclinoid ICA probably from a combination of external to internal collaterals and flow through a patent anterior communicating artery. Flow is present within the right anterior and middle cerebral vessels, though the caliber is diminished when compared to the right side. No change since the previous study. Posterior circulation: Both vertebral arteries are patent through the foramen  magnum to the basilar. No basilar stenosis. Posterior circulation branch vessels are patent. Atherosclerotic irregularity of the PCA branches. Venous sinuses: Patent and normal. Anatomic variants: None significant. Delayed phase: Not performed. Review of the MIP images confirms the above findings CT Brain Perfusion Findings: Severely motion degraded exam, without useful data because of this. IMPRESSION: No large or medium vessel occlusion identified acutely. Perfusion imaging is severely degraded by motion and not useful. Advanced chronic atherosclerotic disease of the aorta. 50% stenosis of the innominate artery  origin. 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 Right carotid siphon stent without proximal flow. Flow in the left anterior and middle cerebral arteries likely due to a patent anterior communicating artery. Diminished caliber related to diminished flow volume. No acute finding however. Left carotid bifurcation atherosclerotic disease with no measurable stenosis. No left anterior circulation large or medium vessel occlusion. 50% stenoses at both vertebral artery origins. Serial focal stenoses throughout both vertebral arteries, but without critical stenosis. Perfusion study suffers from severe motion and is not usable. 2 cm spiculated mass in the right upper lobe consistent with lung carcinoma. Little change since February. However, the lesion was not present in 2014. These results were communicated to Dr. Rory Percy at 3:45 pmon 7/13/2020by text page via the [facility] messaging system. Electronically Signed   By: Nelson Chimes M.D.   On: 08/22/2018 15:56   Ct Cerebral Perfusion W Contrast  Result Date: 08/22/2018 CLINICAL DATA:  Right-sided weakness and slurred speech. EXAM: CT ANGIOGRAPHY HEAD AND NECK CT PERFUSION BRAIN TECHNIQUE: Multidetector CT imaging of the head and neck was performed using the standard protocol during bolus administration of intravenous contrast. Multiplanar CT image reconstructions and MIPs were obtained to evaluate the vascular anatomy. Carotid stenosis measurements (when applicable) are obtained utilizing NASCET criteria, using the distal internal carotid diameter as the denominator. Multiphase CT imaging of the brain was performed following IV bolus contrast injection. Subsequent parametric perfusion maps were calculated using RAPID software. CONTRAST:  8mL OMNIPAQUE IOHEXOL 350 MG/ML SOLN COMPARISON:  03/25/2018.  December 2019. head CT earlier today. FINDINGS: CTA NECK FINDINGS Aortic arch: Advanced aortic atherosclerosis with irregular plaque particularly at  the arch. Branching pattern is normal. 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 Right carotid system: Common carotid artery shows extensive soft and calcified plaque but is sufficiently patent to the distal common carotid artery where there is a carotid artery stent. There is flow in the external carotid artery but no flow in the internal carotid artery due to chronic occlusion at this level. Left carotid system: Common carotid artery shows some atherosclerotic plaque along its course but no stenosis proximal to the bifurcation. There is calcified plaque at the carotid bifurcation and ICA bulb. Minimal diameter is 4 mm, the same as the more distal cervical ICA, therefore no stenosis. Vertebral arteries: Right vertebral artery origin shows calcified plaque with stenosis of 50%. Left vertebral artery origin shows calcified plaque with stenosis also estimated at 50%. Both vertebral arteries are patent through the cervical region, with foci of atherosclerotic calcification resulting in 30-50% narrowing. No critical vertebral stenosis is seen. Skeleton: Ordinary spondylosis.  Chronic fusion C2-3. Other neck: No mass or lymphadenopathy. Upper chest: 2 cm spiculated mass in the right upper lobe highly likely to represent lung carcinoma. This has not changed much since February but was not present in 2014. Review of the MIP images confirms the above findings CTA HEAD FINDINGS Anterior circulation: Left  internal carotid artery is patent through the skull base and siphon region. There is siphon atherosclerotic calcification with stenosis estimated at 50%. Left anterior and middle cerebral vessels show flow, with mild atherosclerotic irregularity but no evidence of large or medium vessel occlusion. The right internal carotid artery does not show flow at the skull base. There is a stent of the carotid siphon region. There is reconstitution of the supraclinoid ICA probably from a combination of  external to internal collaterals and flow through a patent anterior communicating artery. Flow is present within the right anterior and middle cerebral vessels, though the caliber is diminished when compared to the right side. No change since the previous study. Posterior circulation: Both vertebral arteries are patent through the foramen magnum to the basilar. No basilar stenosis. Posterior circulation branch vessels are patent. Atherosclerotic irregularity of the PCA branches. Venous sinuses: Patent and normal. Anatomic variants: None significant. Delayed phase: Not performed. Review of the MIP images confirms the above findings CT Brain Perfusion Findings: Severely motion degraded exam, without useful data because of this. IMPRESSION: No large or medium vessel occlusion identified acutely. Perfusion imaging is severely degraded by motion and not useful. Advanced chronic atherosclerotic disease of the aorta. 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 Right carotid siphon stent without proximal flow. Flow in the left anterior and middle cerebral arteries likely due to a patent anterior communicating artery. Diminished caliber related to diminished flow volume. No acute finding however. Left carotid bifurcation atherosclerotic disease with no measurable stenosis. No left anterior circulation large or medium vessel occlusion. 50% stenoses at both vertebral artery origins. Serial focal stenoses throughout both vertebral arteries, but without critical stenosis. Perfusion study suffers from severe motion and is not usable. 2 cm spiculated mass in the right upper lobe consistent with lung carcinoma. Little change since February. However, the lesion was not present in 2014. These results were communicated to Dr. Rory Percy at 3:45 pmon 7/13/2020by text page via the [facility] messaging system. Electronically Signed   By: Nelson Chimes M.D.   On: 08/22/2018  15:56   Ct Head Code Stroke Wo Contrast  Result Date: 08/22/2018 CLINICAL DATA:  Code stroke. Altered level of consciousness. Right-sided deficit. Slurred speech EXAM: CT HEAD WITHOUT CONTRAST TECHNIQUE: Contiguous axial images were obtained from the base of the skull through the vertex without intravenous contrast. COMPARISON:  CT head 01/12/2018 FINDINGS: Brain: Mild atrophy. Ventricle size normal. Chronic right MCA infarct over the convexity is unchanged. Negative for acute infarct, hemorrhage, or mass.  No midline shift. Vascular: Negative for hyperdense vessel Skull: Negative Sinuses/Orbits: Surgical repair of left orbital floor fracture. Mucosal edema left maxillary sinus. Bilateral cataract surgery. Other: None ASPECTS ([place] Stroke Program Early CT Score) - Ganglionic level infarction (caudate, lentiform nuclei, internal capsule, insula, M1-M3 cortex): 7 - Supraganglionic infarction (M4-M6 cortex): 3 Total score (0-10 with 10 being normal): 10 IMPRESSION: 1. No acute abnormality 2. Chronic right MCA infarct 3. ASPECTS is 10 4. These results were called by telephone at the time of interpretation on 08/22/2018 at 2:45 pm to Dr. Rory Percy, who verbally acknowledged these results. Electronically Signed   By: Franchot Gallo M.D.   On: 08/22/2018 14:45    Pending Labs Unresulted Labs (From admission, onward)    Start     Ordered   08/22/18 1937  SARS Coronavirus 2 (CEPHEID - Performed in [place] hospital lab), [facility] Order  (Asymptomatic Patients  Labs)  Once,   STAT    Question:  Rule Out  Answer:  Yes   08/22/18 1937   08/22/18 1638  Folate  (Anemia Panel (PNL))  ONCE - STAT,   STAT     08/22/18 1637   08/22/18 1556  Occult blood card to lab, stool RN will collect  Once,   STAT    Question:  Specimen to be collected by?  Answer:  RN will collect   08/22/18 1555          Vitals/Pain Today's Vitals   08/22/18 1830 08/22/18 1845 08/22/18 1900 08/22/18 1930  BP: 113/70 106/60 130/61 127/61   Pulse: 92 81 90 88  Resp: (!) 22 20 (!) 22 20  Temp:    98.9 F (37.2 C)  TempSrc:    Oral  SpO2: 100% 100% 100% 100%  Weight:        Isolation Precautions No active isolations  Medications Medications  pantoprazole (PROTONIX) 80 mg in sodium chloride 0.9 % 250 mL (0.32 mg/mL) infusion (8 mg/hr Intravenous New Bag/Given 08/22/18 1906)  iohexol (OMNIPAQUE) 350 MG/ML injection 100 mL (90 mLs Intravenous Contrast Given 08/22/18 1441)  0.9 %  sodium chloride infusion (10 mL/hr Intravenous New Bag/Given 08/22/18 1614)  pantoprazole (PROTONIX) 80 mg in sodium chloride 0.9 % 100 mL IVPB (0 mg Intravenous Stopped 08/22/18 1907)    Mobility non-ambulatory High fall risk   Focused Assessments Neuro Assessment Handoff:  Swallow screen pass? Yes    NIH Stroke Scale ( + Modified Stroke Scale Criteria)  Interval: Initial Level of Consciousness (1a.)   : Alert, keenly responsive LOC Questions (1b. )   +: Answers both questions correctly LOC Commands (1c. )   + : Performs both tasks correctly Best Gaze (2. )  +: Normal Visual (3. )  +: No visual loss Facial Palsy (4. )    : Minor paralysis Motor Arm, Left (5a. )   +: No effort against gravity Motor Arm, Right (5b. )   +: No drift Motor Leg, Left (6a. )   +: No effort against gravity Motor Leg, Right (6b. )   +: No drift Limb Ataxia (7. ): Absent Sensory (8. )   +: Normal, no sensory loss Best Language (9. )   +: No aphasia Dysarthria (10. ): Severe dysarthria, patient's speech is so slurred as to be unintelligible in the absence of or out of proportion to any dysphasia, or is mute/anarthric Extinction/Inattention (11.)   +: No Abnormality Modified SS Total  +: 6 Complete NIHSS TOTAL: 9 Last date known well: 08/22/18 Last time known well: 1000 Neuro Assessment: Exceptions to WDL Neuro Checks:   Initial (08/22/18 1509)  Last Documented NIHSS Modified Score: 6 (08/22/18 1830) Has TPA been given? No If patient is a Neuro Trauma and  patient is going to OR before floor call report to [place] nurse: [phone] or [phone]     R Recommendations: See Admitting Provider Note  Report given to:   Additional Notes:

## 2018-08-22 NOTE — ED Provider Notes (Signed)
  Physical Exam  BP 131/72   Pulse 93   Temp 98.5 F (36.9 C) (Oral)   Resp 20   Wt 98 kg   SpO2 100%   BMI 29.30 kg/m   Physical Exam  ED Course/Procedures     Procedures  MDM  Care assumed at 4 pm from Dr. Wilson Singer.  Patient sent here for anemia and there was apparently having worsening weakness so code stroke was activated.  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  Neurology saw patient and thought that the symptoms was consistent with symptomatic anemia and patient had decreased perfusion due to anemia.  His hemoglobin is 5.8.  Transfusion was ordered and signed out pending admission.  7:25 PM Occ positive. Likely had upper GI bleed. Started on protonix bolus and drip. Transfusion going on. Consulted Dr. Modena Nunnery from GI to see patient. Family practice to admit.       Drenda Freeze, MD 08/22/18 [phone]

## 2018-08-22 NOTE — Progress Notes (Signed)
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  Pt was subsequently transported to [facility] ED for evaluation of stroke like symptoms prior to hospice services beginning.    [facility] will follow for d/c planning needs and anticipate enrolling in hospice once back in facility.  Thank you, Venia Carbon RN, BSN, [facility] Liaison (in [place]) [phone]

## 2018-08-22 NOTE — ED Notes (Signed)
Attempted report x1. 

## 2018-08-22 NOTE — Progress Notes (Signed)
Pt admitted from ED with stroke diagnosis, alert and oriented, c/o of leg pain, pt settled in bed with call light at bedside, tele monitor put and verified on pt, was however reassured and will continue to monitor, v/s stable, safety concern addressed accordingly. [name], [facility]abaree Mercado Efe

## 2018-08-22 NOTE — ED Triage Notes (Signed)
Pt BIB [facility] d/t  int'l low HCT, BP. Ems noticed R.facial droop w/ slurred speech. Pt LKW 1000 this AM. Hx CVA w/ residual L.sided weakness

## 2018-08-23 ENCOUNTER — Inpatient Hospital Stay (HOSPITAL_COMMUNITY): Payer: Medicare Other

## 2018-08-23 ENCOUNTER — Encounter (HOSPITAL_COMMUNITY): Payer: Self-pay | Admitting: *Deleted

## 2018-08-23 DIAGNOSIS — D500 Iron deficiency anemia secondary to blood loss (chronic): Secondary | ICD-10-CM

## 2018-08-23 DIAGNOSIS — Z515 Encounter for palliative care: Secondary | ICD-10-CM

## 2018-08-23 DIAGNOSIS — Z86718 Personal history of other venous thrombosis and embolism: Secondary | ICD-10-CM

## 2018-08-23 DIAGNOSIS — G8194 Hemiplegia, unspecified affecting left nondominant side: Secondary | ICD-10-CM

## 2018-08-23 DIAGNOSIS — R9389 Abnormal findings on diagnostic imaging of other specified body structures: Secondary | ICD-10-CM

## 2018-08-23 DIAGNOSIS — R195 Other fecal abnormalities: Secondary | ICD-10-CM

## 2018-08-23 DIAGNOSIS — D649 Anemia, unspecified: Secondary | ICD-10-CM

## 2018-08-23 DIAGNOSIS — Z8673 Personal history of transient ischemic attack (TIA), and cerebral infarction without residual deficits: Secondary | ICD-10-CM

## 2018-08-23 DIAGNOSIS — Z7189 Other specified counseling: Secondary | ICD-10-CM

## 2018-08-23 DIAGNOSIS — R918 Other nonspecific abnormal finding of lung field: Secondary | ICD-10-CM

## 2018-08-23 DIAGNOSIS — E785 Hyperlipidemia, unspecified: Secondary | ICD-10-CM

## 2018-08-23 LAB — BASIC METABOLIC PANEL
Anion gap: 8 (ref 5–15)
BUN: <5 mg/dL — ABNORMAL LOW (ref 8–23)
CO2: 25 mmol/L (ref 22–32)
Calcium: 9.3 mg/dL (ref 8.9–10.3)
Chloride: 114 mmol/L — ABNORMAL HIGH (ref 98–111)
Creatinine, Ser: 0.64 mg/dL (ref 0.61–1.24)
GFR calc Af Amer: >60 mL/min (ref >60)
GFR calc non Af Amer: >60 mL/min (ref >60)
Glucose, Bld: 114 mg/dL — ABNORMAL HIGH (ref 70–99)
Potassium: 3.2 mmol/L — ABNORMAL LOW (ref 3.5–5.1)
Sodium: 147 mmol/L — ABNORMAL HIGH (ref 135–145)

## 2018-08-23 LAB — HEMOGLOBIN AND HEMATOCRIT, BLOOD
HCT: 26.9 % — ABNORMAL LOW (ref 39.0–52.0)
HCT: 27.9 % — ABNORMAL LOW (ref 39.0–52.0)
Hemoglobin: 7.2 g/dL — ABNORMAL LOW (ref 13.0–17.0)
Hemoglobin: 7.9 g/dL — ABNORMAL LOW (ref 13.0–17.0)

## 2018-08-23 LAB — MRSA PCR SCREENING: MRSA by PCR: POSITIVE — AB

## 2018-08-23 LAB — PREPARE RBC (CROSSMATCH)

## 2018-08-23 LAB — MAGNESIUM: Magnesium: 1.9 mg/dL (ref 1.7–2.4)

## 2018-08-23 MED ORDER — SODIUM CHLORIDE 0.9% IV SOLUTION
Freq: Once | INTRAVENOUS | Status: AC
  Administered 2018-08-23: 12:00:00 via INTRAVENOUS

## 2018-08-23 MED ORDER — MUPIROCIN 2 % EX OINT
1.0000 "application " | TOPICAL_OINTMENT | Freq: Two times a day (BID) | CUTANEOUS | Status: DC
  Administered 2018-08-23 – 2018-08-26 (×8): 1 via NASAL
  Filled 2018-08-23: qty 22

## 2018-08-23 MED ORDER — SODIUM CHLORIDE 0.9 % IV SOLN
510.0000 mg | Freq: Once | INTRAVENOUS | Status: AC
  Administered 2018-08-23: 510 mg via INTRAVENOUS
  Filled 2018-08-23: qty 17

## 2018-08-23 MED ORDER — IOHEXOL 300 MG/ML  SOLN
100.0000 mL | Freq: Once | INTRAMUSCULAR | Status: AC | PRN
  Administered 2018-08-23: 100 mL via INTRAVENOUS

## 2018-08-23 MED ORDER — POTASSIUM CHLORIDE 10 MEQ/100ML IV SOLN
10.0000 meq | Freq: Once | INTRAVENOUS | Status: AC
  Administered 2018-08-23: 10 meq via INTRAVENOUS

## 2018-08-23 MED ORDER — POTASSIUM CHLORIDE 10 MEQ/100ML IV SOLN
10.0000 meq | Freq: Once | INTRAVENOUS | Status: AC
  Administered 2018-08-23: 10 meq via INTRAVENOUS
  Filled 2018-08-23: qty 100

## 2018-08-23 MED ORDER — SODIUM CHLORIDE 0.9% FLUSH
10.0000 mL | Freq: Two times a day (BID) | INTRAVENOUS | Status: DC
  Administered 2018-08-23 – 2018-08-26 (×7): 10 mL

## 2018-08-23 MED ORDER — SODIUM CHLORIDE 0.9% FLUSH: 10.0000 mL | INTRAVENOUS | Status: DC | PRN

## 2018-08-23 MED ORDER — CHLORHEXIDINE GLUCONATE CLOTH 2 % EX PADS
6.0000 | MEDICATED_PAD | Freq: Every day | CUTANEOUS | Status: DC
  Administered 2018-08-23 – 2018-08-24 (×2): 6 via TOPICAL

## 2018-08-23 MED ORDER — SODIUM CHLORIDE 0.9% IV SOLUTION
Freq: Once | INTRAVENOUS | Status: AC
  Administered 2018-08-24: via INTRAVENOUS

## 2018-08-23 MED ORDER — POTASSIUM CHLORIDE 10 MEQ/100ML IV SOLN
10.0000 meq | INTRAVENOUS | Status: AC
  Administered 2018-08-23 (×2): 10 meq via INTRAVENOUS
  Filled 2018-08-23 (×3): qty 100

## 2018-08-23 NOTE — Progress Notes (Signed)
Family Medicine Teaching Service Daily Progress Note Intern Pager: [phone]  Patient name: [name] Medical record number: [id number] Date of birth: [birth year] Age: 68 y.o. Gender: male  Primary Care Provider: Charlott Rakes, MD Consultants: GI Code Status: DNR from previous admission   Pt Overview and Major Events to Date:  [name] is a 68 y.o. male presenting with symptomatic anemia . PMH is significant for COPD, GERD, HTN, HLD, T2DM, Prior stroke causing weakness/hemiparysis of left side.  Assessment and Plan:  Symptomatic anemia 2/2 GI bleed Patient admitted [facility] with weakness and pale complexion.   Hb on admission 5.9. Iron studies showed iron wnl, low ferritin. Stool guiac was positive for blood. given Iv fluids in ED.  Head CT showed no acute abnormality but a chronic right MCA infarct. CTA neck showed no large or medium vessel occlusion identified acutely but did show decreased flow volumes. Neurology consulted in the ED who felt decrease cerebral perfusion 2/2 anemia from acute GI bleed. Not had a colonoscopy in 30 years, unclear indication at that time. Per chart review, pt has refused screening colonoscopy since 2016. - continue transfusing 2u PRBC, Monitor H&H post transfusion - Monitor vitals - GI consulted in ED - Dr. Tarri Glenn with [place] to see - appreciate recommendations - SCDs  - continue protonix gtt - am CBC/BMP - NPO  Chronic Diastolic CHF Previous echo in Feb 2671 - normal systolic fx with 24-58% ejection fraction with impaired relaxation.  - Monitor fluid status  CAD, PVD With remote h/o central burning chest pain a few weeks ago, self resolved, no longer has pain. Atypical for ACS. EKG reviewed without ST changes.  - no current indication for cardiac w/u - monitor, continue home lipitor. Restart losartan as BP tolerates.  Dementia Patient on Namenda at home. Per chart review also has dementia-related depression,  takes cymbalta - Continue home meds  Previous stroke/Hemiparesis of left side Patient with prior stroke in 2017 that lead to weakness in his left side. R sided dominance. -monitor right sided weakness, if does not improve with transfusion, consider further evaluation with MRI  - hold aspirin and clopidogrel - continue lipitor  HTN BP today 121/92 Normotensive on admission. Home medication - losartan - Hold home Losartan. Can likely restart tomorrow after transfusion if BP tolerates. - Monitor blood pressures  HLD Home medications include lipitor  - Continue lipitor  T2DM - diet controlled Last A1C 5.7 in February 2020, 6.2 in 2018 CBG 7/13 95 - Monitor with BMP  COPD with lung nodule c/f malignancy sats 98% on room air  On Dulera as home med. Follows with Oncology outpatient for 2cm lung nodule discovered 03/2018 when hospitalized for stroke, previously 0.9cm 01/2018. Last visit with Onc 04/2018 with plans for outpatient PET scan and biopsy of nodule, however patient cancelled per chart review. Per chart review, pt mentioned about 50lb weight loss in 6 mths at [place] visit. - Monitor respiratory status   - Continue Dulera -PET scan as outpatient   GERD: Home medications include protonix -Continue protonix gtt  Hx of seizure disorder: on keppra at home. Unknown last seizure. Continue home keppra  H/o polysubstance use Prior h/o extensive tobacco use (2-3 packs of cigars per day for 60 years). Prior alcohol use, last drink February 2020. Last had crack cocaine in February 2020. - monitor  H/o DVT Very limited mobility. No signs of DVT on exam on admission.  -monitor for DVT   FEN/GI: NPO, sips with meds  PPx: SCDs  Disposition: pending resolution of medical condition   Subjective:  Pt very sleepy when I came to see him. Feeling a little better compared to admission. Denies fevers, chest pain, dyspnea, nausea or vomiting. Report LIF pain and also "throat pain"  which has started recently.   Objective: Temp:  [98.2 F (36.8 C)-99.3 F (37.4 C)] 98.2 F (36.8 C) (07/14 0400) Pulse Rate:  [78-99] 98 (07/14 0400) Resp:  [9-27] 20 (07/14 0400) BP: (93-147)/(55-92) 121/92 (07/14 0400) SpO2:  [94 %-100 %] 98 % (07/14 0400) Weight:  [98 kg-99.3 kg] 99.3 kg (07/13 2200)   General: sleepy, in no acute distress HEENT: Neck non-tender without lymphadenopathy, masses or thyromegaly, prominent adam's apple Cardio: Normal S1 and S2, no S3 or S4. Rhythm is regular. No murmurs or rubs.   Pulm: Clear to auscultation bilaterally, no crackles, wheezing, or diminished breath sounds. Normal respiratory effort Abdomen: Abdomen soft and non-tender. Hypoactive bowel sounds  Extremities: No peripheral edema. Warm/ well perfused.  Neuro: Cranial nerves grossly intact  Laboratory: Recent Labs  Lab 08/22/18 1430 08/22/18 1442 08/23/18 0513  WBC 5.9  --  5.4  HGB 5.9* 6.8* 6.7*  HCT 22.9* 20.0* 24.7*  PLT 214  --  188   Recent Labs  Lab 08/22/18 1430 08/22/18 1442 08/23/18 0513  NA 144 145 147*  K 3.2* 3.2* 3.2*  CL 113* 110 114*  CO2 22  --  25  BUN 8 6* <5*  CREATININE 0.84 0.70 0.64  CALCIUM 9.4  --  9.3  PROT 5.9*  --   --   BILITOT 0.8  --   --   ALKPHOS 67  --   --   ALT 11  --   --   AST 15  --   --   GLUCOSE 189* 183* 114*      Imaging/Diagnostic Tests: Ct Angio Head W Or Wo Contrast  Result Date: 08/22/2018 CLINICAL DATA:  Right-sided weakness and slurred speech. EXAM: CT ANGIOGRAPHY HEAD AND NECK CT PERFUSION BRAIN TECHNIQUE: Multidetector CT imaging of the head and neck was performed using the standard protocol during bolus administration of intravenous contrast. Multiplanar CT image reconstructions and MIPs were obtained to evaluate the vascular anatomy. Carotid stenosis measurements (when applicable) are obtained utilizing NASCET criteria, using the distal internal carotid diameter as the denominator. Multiphase CT imaging of the  brain was performed following IV bolus contrast injection. Subsequent parametric perfusion maps were calculated using RAPID software. CONTRAST:  69mL OMNIPAQUE IOHEXOL 350 MG/ML SOLN COMPARISON:  03/25/2018.  December 2019. head CT earlier today. FINDINGS: CTA NECK FINDINGS Aortic arch: Advanced aortic atherosclerosis with irregular plaque particularly at the arch. Branching pattern is normal. 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 Right carotid system: Common carotid artery shows extensive soft and calcified plaque but is sufficiently patent to the distal common carotid artery where there is a carotid artery stent. There is flow in the external carotid artery but no flow in the internal carotid artery due to chronic occlusion at this level. Left carotid system: Common carotid artery shows some atherosclerotic plaque along its course but no stenosis proximal to the bifurcation. There is calcified plaque at the carotid bifurcation and ICA bulb. Minimal diameter is 4 mm, the same as the more distal cervical ICA, therefore no stenosis. Vertebral arteries: Right vertebral artery origin shows calcified plaque with stenosis of 50%. Left vertebral artery origin shows calcified plaque with stenosis also estimated at 50%. Both  vertebral arteries are patent through the cervical region, with foci of atherosclerotic calcification resulting in 30-50% narrowing. No critical vertebral stenosis is seen. Skeleton: Ordinary spondylosis.  Chronic fusion C2-3. Other neck: No mass or lymphadenopathy. Upper chest: 2 cm spiculated mass in the right upper lobe highly likely to represent lung carcinoma. This has not changed much since February but was not present in 2014. Review of the MIP images confirms the above findings CTA HEAD FINDINGS Anterior circulation: Left internal carotid artery is patent through the skull base and siphon region. There is siphon atherosclerotic calcification with stenosis  estimated at 50%. Left anterior and middle cerebral vessels show flow, with mild atherosclerotic irregularity but no evidence of large or medium vessel occlusion. The right internal carotid artery does not show flow at the skull base. There is a stent of the carotid siphon region. There is reconstitution of the supraclinoid ICA probably from a combination of external to internal collaterals and flow through a patent anterior communicating artery. Flow is present within the right anterior and middle cerebral vessels, though the caliber is diminished when compared to the right side. No change since the previous study. Posterior circulation: Both vertebral arteries are patent through the foramen magnum to the basilar. No basilar stenosis. Posterior circulation branch vessels are patent. Atherosclerotic irregularity of the PCA branches. Venous sinuses: Patent and normal. Anatomic variants: None significant. Delayed phase: Not performed. Review of the MIP images confirms the above findings CT Brain Perfusion Findings: Severely motion degraded exam, without useful data because of this. IMPRESSION: No large or medium vessel occlusion identified acutely. Perfusion imaging is severely degraded by motion and not useful. Advanced chronic atherosclerotic disease of the aorta. 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 Right carotid siphon stent without proximal flow. Flow in the left anterior and middle cerebral arteries likely due to a patent anterior communicating artery. Diminished caliber related to diminished flow volume. No acute finding however. Left carotid bifurcation atherosclerotic disease with no measurable stenosis. No left anterior circulation large or medium vessel occlusion. 50% stenoses at both vertebral artery origins. Serial focal stenoses throughout both vertebral arteries, but without critical stenosis. Perfusion study suffers from  severe motion and is not usable. 2 cm spiculated mass in the right upper lobe consistent with lung carcinoma. Little change since February. However, the lesion was not present in 2014. These results were communicated to Dr. Rory Percy at 3:45 pmon 7/13/2020by text page via the [facility] messaging system. Electronically Signed   By: Nelson Chimes M.D.   On: 08/22/2018 15:56   Ct Angio Neck W Or Wo Contrast  Result Date: 08/22/2018 CLINICAL DATA:  Right-sided weakness and slurred speech. EXAM: CT ANGIOGRAPHY HEAD AND NECK CT PERFUSION BRAIN TECHNIQUE: Multidetector CT imaging of the head and neck was performed using the standard protocol during bolus administration of intravenous contrast. Multiplanar CT image reconstructions and MIPs were obtained to evaluate the vascular anatomy. Carotid stenosis measurements (when applicable) are obtained utilizing NASCET criteria, using the distal internal carotid diameter as the denominator. Multiphase CT imaging of the brain was performed following IV bolus contrast injection. Subsequent parametric perfusion maps were calculated using RAPID software. CONTRAST:  76mL OMNIPAQUE IOHEXOL 350 MG/ML SOLN COMPARISON:  03/25/2018.  December 2019. head CT earlier today. FINDINGS: CTA NECK FINDINGS Aortic arch: Advanced aortic atherosclerosis with irregular plaque particularly at the arch. Branching pattern is normal. 50% stenosis of the innominate artery origin with  extensive soft plaque. Right carotid system: Common carotid artery shows extensive soft and calcified plaque but is sufficiently patent to the distal common carotid artery where there is a carotid artery stent. There is flow in the external carotid artery but no flow in the internal carotid artery due to chronic occlusion at this level. Left carotid system: Common carotid artery shows some atherosclerotic plaque along its course but no stenosis proximal to the bifurcation. There is calcified plaque at the carotid bifurcation and  ICA bulb. Minimal diameter is 4 mm, the same as the more distal cervical ICA, therefore no stenosis. Vertebral arteries: Right vertebral artery origin shows calcified plaque with stenosis of 50%. Left vertebral artery origin shows calcified plaque with stenosis also estimated at 50%. Both vertebral arteries are patent through the cervical region, with foci of atherosclerotic calcification resulting in 30-50% narrowing. No critical vertebral stenosis is seen. Skeleton: Ordinary spondylosis.  Chronic fusion C2-3. Other neck: No mass or lymphadenopathy. Upper chest: 2 cm spiculated mass in the right upper lobe highly likely to represent lung carcinoma. This has not changed much since February but was not present in 2014. Review of the MIP images confirms the above findings CTA HEAD FINDINGS Anterior circulation: Left internal carotid artery is patent through the skull base and siphon region. There is siphon atherosclerotic calcification with stenosis estimated at 50%. Left anterior and middle cerebral vessels show flow, with mild atherosclerotic irregularity but no evidence of large or medium vessel occlusion. The right internal carotid artery does not show flow at the skull base. There is a stent of the carotid siphon region. There is reconstitution of the supraclinoid ICA probably from a combination of external to internal collaterals and flow through a patent anterior communicating artery. Flow is present within the right anterior and middle cerebral vessels, though the caliber is diminished when compared to the right side. No change since the previous study. Posterior circulation: Both vertebral arteries are patent through the foramen magnum to the basilar. No basilar stenosis. Posterior circulation branch vessels are patent. Atherosclerotic irregularity of the PCA branches. Venous sinuses: Patent and normal. Anatomic variants: None significant. Delayed phase: Not performed. Review of the MIP images confirms the  above findings CT Brain Perfusion Findings: Severely motion degraded exam, without useful data because of this. IMPRESSION: No large or medium vessel occlusion identified acutely. Perfusion imaging is severely degraded by motion and not useful. Advanced chronic atherosclerotic disease of the aorta. 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 Right carotid siphon stent without proximal flow. Flow in the left anterior and middle cerebral arteries likely due to a patent anterior communicating artery. Diminished caliber related to diminished flow volume. No acute finding however. Left carotid bifurcation atherosclerotic disease with no measurable stenosis. No left anterior circulation large or medium vessel occlusion. 50% stenoses at both vertebral artery origins. Serial focal stenoses throughout both vertebral arteries, but without critical stenosis. Perfusion study suffers from severe motion and is not usable. 2 cm spiculated mass in the right upper lobe consistent with lung carcinoma. Little change since February. However, the lesion was not present in 2014. These results were communicated to Dr. Rory Percy at 3:45 pmon 7/13/2020by text page via the [facility] messaging system. Electronically Signed   By: Nelson Chimes M.D.   On: 08/22/2018 15:56   Ct Cerebral Perfusion W Contrast  Result Date: 08/22/2018 CLINICAL DATA:  Right-sided weakness and slurred speech. EXAM: CT ANGIOGRAPHY  HEAD AND NECK CT PERFUSION BRAIN TECHNIQUE: Multidetector CT imaging of the head and neck was performed using the standard protocol during bolus administration of intravenous contrast. Multiplanar CT image reconstructions and MIPs were obtained to evaluate the vascular anatomy. Carotid stenosis measurements (when applicable) are obtained utilizing NASCET criteria, using the distal internal carotid diameter as the denominator. Multiphase CT imaging of the brain was performed  following IV bolus contrast injection. Subsequent parametric perfusion maps were calculated using RAPID software. CONTRAST:  7mL OMNIPAQUE IOHEXOL 350 MG/ML SOLN COMPARISON:  03/25/2018.  December 2019. head CT earlier today. FINDINGS: CTA NECK FINDINGS Aortic arch: Advanced aortic atherosclerosis with irregular plaque particularly at the arch. Branching pattern is normal. 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 Right carotid system: Common carotid artery shows extensive soft and calcified plaque but is sufficiently patent to the distal common carotid artery where there is a carotid artery stent. There is flow in the external carotid artery but no flow in the internal carotid artery due to chronic occlusion at this level. Left carotid system: Common carotid artery shows some atherosclerotic plaque along its course but no stenosis proximal to the bifurcation. There is calcified plaque at the carotid bifurcation and ICA bulb. Minimal diameter is 4 mm, the same as the more distal cervical ICA, therefore no stenosis. Vertebral arteries: Right vertebral artery origin shows calcified plaque with stenosis of 50%. Left vertebral artery origin shows calcified plaque with stenosis also estimated at 50%. Both vertebral arteries are patent through the cervical region, with foci of atherosclerotic calcification resulting in 30-50% narrowing. No critical vertebral stenosis is seen. Skeleton: Ordinary spondylosis.  Chronic fusion C2-3. Other neck: No mass or lymphadenopathy. Upper chest: 2 cm spiculated mass in the right upper lobe highly likely to represent lung carcinoma. This has not changed much since February but was not present in 2014. Review of the MIP images confirms the above findings CTA HEAD FINDINGS Anterior circulation: Left internal carotid artery is patent through the skull base and siphon region. There is siphon atherosclerotic calcification with stenosis estimated at 50%. Left  anterior and middle cerebral vessels show flow, with mild atherosclerotic irregularity but no evidence of large or medium vessel occlusion. The right internal carotid artery does not show flow at the skull base. There is a stent of the carotid siphon region. There is reconstitution of the supraclinoid ICA probably from a combination of external to internal collaterals and flow through a patent anterior communicating artery. Flow is present within the right anterior and middle cerebral vessels, though the caliber is diminished when compared to the right side. No change since the previous study. Posterior circulation: Both vertebral arteries are patent through the foramen magnum to the basilar. No basilar stenosis. Posterior circulation branch vessels are patent. Atherosclerotic irregularity of the PCA branches. Venous sinuses: Patent and normal. Anatomic variants: None significant. Delayed phase: Not performed. Review of the MIP images confirms the above findings CT Brain Perfusion Findings: Severely motion degraded exam, without useful data because of this. IMPRESSION: No large or medium vessel occlusion identified acutely. Perfusion imaging is severely degraded by motion and not useful. Advanced chronic atherosclerotic disease of the aorta. 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 Right carotid siphon stent without proximal flow. Flow in the left anterior and middle cerebral arteries likely due to a patent anterior communicating artery. Diminished caliber related to diminished flow volume. No acute  finding however. Left carotid bifurcation atherosclerotic disease with no measurable stenosis. No left anterior circulation large or medium vessel occlusion. 50% stenoses at both vertebral artery origins. Serial focal stenoses throughout both vertebral arteries, but without critical stenosis. Perfusion study suffers from severe motion and is not  usable. 2 cm spiculated mass in the right upper lobe consistent with lung carcinoma. Little change since February. However, the lesion was not present in 2014. These results were communicated to Dr. Rory Percy at 3:45 pmon 7/13/2020by text page via the [facility] messaging system. Electronically Signed   By: Nelson Chimes M.D.   On: 08/22/2018 15:56   Ct Head Code Stroke Wo Contrast  Result Date: 08/22/2018 CLINICAL DATA:  Code stroke. Altered level of consciousness. Right-sided deficit. Slurred speech EXAM: CT HEAD WITHOUT CONTRAST TECHNIQUE: Contiguous axial images were obtained from the base of the skull through the vertex without intravenous contrast. COMPARISON:  CT head 01/12/2018 FINDINGS: Brain: Mild atrophy. Ventricle size normal. Chronic right MCA infarct over the convexity is unchanged. Negative for acute infarct, hemorrhage, or mass.  No midline shift. Vascular: Negative for hyperdense vessel Skull: Negative Sinuses/Orbits: Surgical repair of left orbital floor fracture. Mucosal edema left maxillary sinus. Bilateral cataract surgery. Other: None ASPECTS ([place] Stroke Program Early CT Score) - Ganglionic level infarction (caudate, lentiform nuclei, internal capsule, insula, M1-M3 cortex): 7 - Supraganglionic infarction (M4-M6 cortex): 3 Total score (0-10 with 10 being normal): 10 IMPRESSION: 1. No acute abnormality 2. Chronic right MCA infarct 3. ASPECTS is 10 4. These results were called by telephone at the time of interpretation on 08/22/2018 at 2:45 pm to Dr. Rory Percy, who verbally acknowledged these results. Electronically Signed   By: Franchot Gallo M.D.   On: 08/22/2018 14:45    Lattie Haw, MD 08/23/2018, 6:31 AM PGY-1, [place] Intern pager: [phone], text pages welcome

## 2018-08-23 NOTE — Consult Note (Signed)
Consultation Note Date: 08/23/2018   Patient Name: [name]  DOB: [birth year]  MRN: [id number]  Age / Sex: 68 y.o., male  PCP: Charlott Rakes, MD Referring Physician: Leeanne Rio, MD  Reason for Consultation: Establishing goals of care  HPI/Patient Profile: 68 y.o. male  with past medical history of COPD, GERD, hypertension, hyperlipidemia, type 2 diabetes mellitus, prior stroke with resultant left-sided weakness/hemiparesis, CHF, CAD, recently diagnosed lung nodule with concern for malignancy, polysubstance abuse admitted on 08/22/2018 with weakness and subsequently found to have hemoglobin of 5.8.  Code stroke was called but noted that symptoms were likely more due to hypoperfusion and new infarction.   He has been living at [facility] and noted to have a referral for hospice but has not been enrolled.  Palliative medicine consulted for goals of care.  I reached out and discussed with liaison for [facility] who reports that the patient had noted desire to elect hospice benefits but had called back to the referral center and stated that he is no longer interested in pursuing hospice.  Clinical Assessment and Goals of Care: I met today with [name].  He laid with eyes closed throughout most of encounter and reported being tired today.  He did respond appropriately to questions, but sometimes required being asked more than one time.  I introduced palliative care as specialized medical care for people living with serious illness. It focuses on providing relief from the symptoms and stress of a serious illness. The goal is to improve quality of life for both the patient and the family.  He tells me that he feels the doctors have been doing a good job explaining things to him.  He reports his only support is his friend, [name].  I talked with him about his clinical course  over the past several months including admission for GI bleed, discovery of lung nodule, evaluation by Dr. Julien Nordmann with plan for PET scan to further characterize( and the fact that this is not yet occurred), and his overall goals regarding care plan moving forward.  He does endorse having significant decline in his nutrition and functional status over the past several months.  We discussed his current living situation at [facility] as well as note that he may be interested in electing his hospice benefits.  He tells me today that he was not really sure exactly what was meant when he was asked about enrolling in hospice and does not feel that this would be the best care plan for him at this time.  When I talk with him about options moving forward, he reports wanting to have PET scan as recommended by Dr. Earlie Server and follow-up with him prior to making any other decisions.  I reviewed with him prior advanced directives that were located in his chart on the computer.  He has a healthcare power of attorney document that was completed in July 2012 that names Duncan Dull as his healthcare surrogate.  Mr. Linford tells me today that  he has not spoken with Levada Dy in several years and that she is not the correct person to help make decisions on his behalf.  He states that he has been relying on his friend, [name], to help him with medical decisions and caregiving.  He did asked me to call her to update her on his situation.  He told me that I should not try and contact Duncan Dull in any point moving forward.  I also noted that on an advanced care document that he stated he would not want transfusions.  He states he has also change his mind on this.  I called at his request and was able to reach his friend, [name].  She reports that she has known [name] for approximately 11 years.  She began as a caregiver for him but is now his closest friend and she reports she will "do the best I can" to help  look out for him.  He has no other family and he reported to me that [name] is the only person that he has whom he trusts and can rely on.  I reviewed his clinical course with [name] and she states that he had stated to her that he wanted to continue work-up for pulmonary nodule.  She feels as though his functional status and nutrition has continued to decline and she sees very similar changes to though she saw on her father who just passed from cancer in the last couple of months.  He has not spoken with her about consideration for electing his hospice benefits, but she reports seeing " that might be the right thing to do really soon."  Questions and concerns addressed.   PMT will continue to support holistically.  SUMMARY OF RECOMMENDATIONS   - [name] reports that he is not interested in consideration for hospice at this time.  He acknowledges that it is something that he had been discussing with skilled facility, however his goal at this point is to have evaluation by GI for acute bleeding issue, have PET scan (will likely need to be rescheduled again as an outpatient when he discharges from the hospital), and follow-up with Dr. Earlie Server prior to making any further decisions regarding long-term care plan. - Patient has healthcare power of attorney paperwork naming Duncan Dull as his Ambulance person.  He reports this is no longer up-to-date and he wishes to complete paperwork naming his friend, [name], as his healthcare power of attorney.  [name] reports that they started this process at the recommendation of the cancer center but have not been able to get anything notarized due to [place] pandemic.  I will place referral to spiritual care to see if they can help facilitate or answer any questions while he is inpatient. -I discussed with patient and his friend, [name], regarding recommendation for follow-up by palliative care as outpatient at skilled facility.  Please include this in his  discharge summary if primary team agrees this is appropriate.  Code Status/Advance Care Planning:  DNR  Psycho-social/Spiritual:   Desire for further Chaplaincy support:yes  Additional Recommendations: Caregiving  Support/Resources  Prognosis:   Unable to determine  Discharge Planning: [facility] for rehab with Palliative care service follow-up      Primary Diagnoses: Present on Admission:  Symptomatic anemia   I have reviewed the medical record, interviewed the patient and family, and examined the patient. The following aspects are pertinent.  Past Medical History:  Diagnosis Date   Acute blood loss anemia  Acute CVA (cerebrovascular accident) ([place]) 08/27/2014   Acute encephalopathy 11/28/2017   Acute ischemic stroke (HCC)    AKI (acute kidney injury) ([place])    Alcohol abuse    H/O   Alcohol dependence with withdrawal with complication ([place]) 0/09/6759   Anxiety    Arterial ischemic stroke, MCA (middle cerebral artery), right, acute (HCC)    Arthritis    Asthma    Binocular vision disorder with diplopia 01/03/2013   CAD (coronary artery disease) 05/27/10   Cath: severe single vessell CAD left cx midportion obtuse marginal 2 to 3.   Cerebral infarction due to embolism of right middle cerebral artery (HCC)    Cerebral infarction due to stenosis of right carotid artery ([place]) 11/01/2014   Cerebral thrombosis with cerebral infarction 07/18/2016   Cerebrovascular accident (CVA) due to stenosis of right carotid artery (HCC)    CHF (congestive heart failure) ([place])    Closed blow-out fracture of floor of orbit ([place]) 01/19/2013   COPD (chronic obstructive pulmonary disease) (HCC)    COPD (chronic obstructive pulmonary disease) (HCC)    CVA (cerebral vascular accident) ([place]) 08/26/2014   Dementia due to another medical condition ([place]) 07/12/2015   Depression    Diabetes mellitus type 2 in obese ([place])    Diabetes mellitus, type 2 ([place])  10/15/930   Diastolic CHF, acute on chronic ([place]) 11/01/2014   DOE (dyspnea on exertion) 11/23/2014   DVT (deep venous thrombosis) ([place]) 6/71/2458   Embolic stroke ([place])    Enophthalmos due to trauma 01/19/2013   Eyelid lesion 01/24/2013   Fracture of inferior orbital wall ([place]) 01/03/2013   Fracture of orbital floor ([place]) 01/03/2013   GERD (gastroesophageal reflux disease)    H/O total knee replacement 10/24/2014   HCAP (healthcare-associated pneumonia) 10/15/2015   Headache around the eyes 08/27/2014   History of DVT (deep vein thrombosis)    History of DVT of lower extremity    Hyperkalemia 10/24/2014   Hyperlipemia    Hyperlipidemia    Hypertension    Hypoalbuminemia due to protein-calorie malnutrition ([place])    Hypotension    Labile blood pressure    Left shoulder pain 09/28/2017   Lower extremity edema 02/21/2014   Medial orbital wall fracture 01/19/2013   Myocardial infarction ([place]) 2000   Orbital fracture 12/2012   OSA on CPAP    Pain of left eye 01/03/2013   Peripheral vascular disease, unspecified ([place])    08/20/10 doppler: increase in right ABI post-op. Left ABI stable. S/P bi-fem bypass surgery   Pneumonia 04/04/2012   Right middle cerebral artery stroke ([place]) 01/18/2018   Seizures ([place])    Shortness of breath    Stroke (cerebrum) ([place]) 02/06/2015   Stroke ([place])    Thrombocytopenia (HCC)    Tobacco abuse    Trichiasis without entropion 04/16/2013   Overview:  LLL central    Type 2 diabetes mellitus with peripheral neuropathy ([place]) 03/13/2015   Urinary incontinence 03/14/2015   Urinary incontinence 03/14/2015   Social History   Socioeconomic History   Marital status: Divorced    Spouse name: Not on file   Number of children: Not on file   Years of education: Not on file   Highest education level: Not on file  Occupational History   Not on file  Social Needs   Financial resource strain: Not on file   Food insecurity     Worry: Not on file    Inability: Not on file   Transportation needs  Medical: Not on file    Non-medical: Not on file  Tobacco Use   Smoking status: Current Every Day Smoker    Packs/day: 0.50    Years: 50.00    Pack years: 25.00    Types: Cigars, Cigarettes    Start date: 02/09/1961   Smokeless tobacco: Former Systems developer    Types: Chew   Tobacco comment: 2 ppd, full flavor  Substance and Sexual Activity   Alcohol use: Yes    Comment: occasionally, "I might drink 1 every 6 months"   Drug use: Yes    Types: Cocaine    Comment: "I smoke crack every once in a while"; last smoked yesterday   Sexual activity: Yes  Lifestyle   Physical activity    Days per week: Not on file    Minutes per session: Not on file   Stress: Not on file  Relationships   Social connections    Talks on phone: Not on file    Gets together: Not on file    Attends religious service: Not on file    Active member of club or organization: Not on file    Attends meetings of clubs or organizations: Not on file    Relationship status: Not on file  Other Topics Concern   Not on file  Social History Narrative   Recovering alcoholic   Family History  Problem Relation Age of Onset   Heart attack Mother    Heart failure Mother    Cirrhosis Father    Heart failure Brother    Cancer Brother    Hypertension Neg Hx        UNKNOWN   Stroke Neg Hx        UNKNOWN   Scheduled Meds:  sodium chloride   Intravenous Once   allopurinol  300 mg Oral Daily   atorvastatin  80 mg Oral q1800   Chlorhexidine Gluconate Cloth  6 each Topical Q0600   cholecalciferol  1,000 Units Oral Daily   DULoxetine  60 mg Oral Daily   folic acid  1 mg Oral Daily   guaiFENesin  1,200 mg Oral BID   hydrocerin  1 application Topical Daily   levETIRAcetam  500 mg Oral BID   Melatonin  6 mg Oral QHS   memantine  10 mg Oral BID   mometasone-formoterol  2 puff Inhalation BID   mupirocin ointment  1 application  Nasal BID   pregabalin  150 mg Oral BID   tiZANidine  2 mg Oral TID   traZODone  100 mg Oral QHS   Continuous Infusions:  pantoprozole (PROTONIX) infusion 8 mg/hr (08/23/18 0620)   potassium chloride     PRN Meds:.camphor-menthol, hydrocortisone cream Medications Prior to Admission:  Prior to Admission medications   Medication Sig Start Date End Date Taking? Authorizing Provider  allopurinol (ZYLOPRIM) 300 MG tablet Take 1 tablet (300 mg total) by mouth daily. 04/01/18  Yes Daisy Floro, DO  aspirin EC 81 MG tablet Take 81 mg by mouth daily.   Yes [provider]  atorvastatin (LIPITOR) 80 MG tablet Take 1 tablet (80 mg total) by mouth daily at 6 PM. Patient taking differently: Take 80 mg by mouth daily.  04/01/18  Yes Daisy Floro, DO  camphor-menthol [facility]) lotion Apply topically as needed for itching. 04/01/18  Yes Daisy Floro, DO  Cholecalciferol 25 MCG (1000 UT) tablet Take 1,000 Units by mouth daily.   Yes [provider]  clopidogrel (PLAVIX) 75 MG  tablet Take 1 tablet (75 mg total) by mouth daily. 04/01/18  Yes Daisy Floro, DO  DULoxetine (CYMBALTA) 60 MG capsule Take 1 capsule (60 mg total) by mouth daily. 04/01/18  Yes Milus Banister C, DO  ferrous sulfate 325 (65 FE) MG tablet Take 325 mg by mouth 2 (two) times daily.   Yes [provider]  folic acid (FOLVITE) 1 MG tablet Take 1 tablet (1 mg total) by mouth daily. 04/01/18  Yes Daisy Floro, DO  guaiFENesin (MUCINEX) 600 MG 12 hr tablet Take 1,200 mg by mouth 2 (two) times daily.   Yes [provider]  hydrocortisone 2.5 % cream Apply 1 application topically every 6 (six) hours as needed (itchiness).   Yes [provider]  Infant Care Products (DERMACLOUD) CREA Apply 1 application topically 2 (two) times daily. Apply to buttocks   Yes [provider]  levETIRAcetam (KEPPRA) 500 MG tablet Take 1 tablet (500 mg total) by mouth 2 (two) times  daily. 04/01/18  Yes Milus Banister C, DO  losartan (COZAAR) 25 MG tablet Take 1 tablet (25 mg total) by mouth daily. 04/02/18  Yes Daisy Floro, DO  Melatonin 5 MG TABS Take 5 mg by mouth at bedtime.   Yes [provider]  memantine (NAMENDA) 10 MG tablet Take 1 tablet (10 mg total) by mouth 2 (two) times daily. 04/01/18  Yes Milus Banister C, DO  mometasone-formoterol (DULERA) 200-5 MCG/ACT AERO Inhale 2 puffs into the lungs 2 (two) times daily. 04/01/18  Yes Milus Banister C, DO  pantoprazole (PROTONIX) 40 MG tablet Take 1 tablet (40 mg total) by mouth daily. 04/01/18  Yes Milus Banister C, DO  polyethylene glycol (MIRALAX / GLYCOLAX) 17 g packet Take 17 g by mouth daily as needed for moderate constipation.   Yes [provider]  pregabalin (LYRICA) 150 MG capsule Take 1 capsule (150 mg total) by mouth 2 (two) times daily. 04/01/18  Yes Milus Banister C, DO  sennosides-docusate sodium (SENOKOT-S) 8.6-50 MG tablet Take 2 tablets by mouth at bedtime.   Yes [provider]  Skin Protectants, Misc. (EUCERIN) cream Apply 1 application topically daily.   Yes [provider]  tiZANidine (ZANAFLEX) 2 MG tablet Take 2 mg by mouth 3 (three) times daily.   Yes [provider]  traZODone (DESYREL) 100 MG tablet Take 100 mg by mouth at bedtime.   Yes [provider]  Grant Ruts INHUB 250-50 MCG/DOSE AEPB Inhale 1-2 puffs into the lungs daily. 04/01/18  Yes Daisy Floro, DO  acetaminophen (TYLENOL) 325 MG tablet Take 2 tablets (650 mg total) by mouth every 4 (four) hours as needed for mild pain (or temp > 37.5 C (99.5 F)). 04/01/18   Daisy Floro, DO  aspirin 325 MG tablet Take 1 tablet (325 mg total) by mouth daily. Patient not taking: Reported on 08/22/2018 04/01/18   Daisy Floro, DO  baclofen (LIORESAL) 10 MG tablet Take 1 tablet (10 mg total) by mouth at bedtime. Patient not taking: Reported on 08/22/2018 04/01/18   Daisy Floro, DO  diphenhydramine-acetaminophen (TYLENOL PM) 25-500 MG TABS tablet Take 1 tablet by mouth at bedtime as needed. Patient not taking: Reported on 08/22/2018 04/01/18   Milus Banister C, DO  hydrOXYzine (ATARAX/VISTARIL) 25 MG tablet Take 1 tablet (25 mg total) by mouth every 8 (eight) hours as needed for itching. Patient not taking: Reported on 08/22/2018 04/01/18   Milus Banister C, DO  nicotine (NICODERM CQ -  DOSED IN MG/24 HOURS) 21 mg/24hr patch Place 1 patch (21 mg total) onto the skin daily. Patient not taking: Reported on 08/22/2018 04/01/18   Daisy Floro, DO   Allergies  Allergen Reactions   Darvocet [Propoxyphene N-Acetaminophen] Hives   Haldol [Haloperidol Decanoate] Hives   Acetaminophen Nausea Only    Upset stomach, tolerates Hydrocodone/APAP if taken with food   Metformin And Related Nausea And Vomiting   Norco [Hydrocodone-Acetaminophen] Nausea And Vomiting    Tolerates if taken with food   Review of Systems  Constitutional: Positive for activity change, appetite change, fatigue and unexpected weight change.  Neurological: Positive for weakness.  Psychiatric/Behavioral: Positive for sleep disturbance.   Physical Exam General: Alert, awake, in no acute distress.  HEENT: No bruits, no goiter, no JVD Heart: Regular rate and rhythm. No murmur appreciated. Lungs: Good air movement, Scattered coarse Abdomen: Soft, nontender, nondistendedExt: No significant edema Skin: Warm and dry Neuro: L side weakness  Vital Signs: BP (!) 133/52 (BP Location: Left Arm)    Pulse 81    Temp 98 F (36.7 C) (Oral)    Resp 18    Ht 6' (1.829 m)    Wt 99.3 kg    SpO2 96%    BMI 29.69 kg/m  Pain Scale: 0-10   Pain Score: 0-No pain   SpO2: SpO2: 96 % O2 Device:SpO2: 96 % O2 Flow Rate: .O2 Flow Rate (L/min): 2 L/min  IO: Intake/output summary:   Intake/Output Summary (Last 24 hours) at 08/23/2018 1109 Last data filed at 08/23/2018 0400 Gross per 24 hour  Intake 958.5 ml   Output 400 ml  Net 558.5 ml    LBM:   Baseline Weight: Weight: 98 kg Most recent weight: Weight: 99.3 kg     Palliative Assessment/Data:   Flowsheet Rows     Most Recent Value  Intake Tab  Referral Department  Hospitalist  Unit at Time of Referral  Med/Surg Unit  Palliative Care Primary Diagnosis  Other (Comment) [Gastrointestinal]  Date Notified  08/23/18  Reason for referral  Clarify Goals of Care  Date of Admission  08/22/18  Date first seen by Palliative Care  08/23/18  # of days Palliative referral response time  0 Day(s)  # of days IP prior to Palliative referral  1  Clinical Assessment  Palliative Performance Scale Score  50%  Pain Max last 24 hours  5  Pain Min Last 24 hours  0  Psychosocial & Spiritual Assessment  Palliative Care Outcomes  Patient/Family meeting held?  Yes  Who was at the meeting?  Patient, friend [name]) via phone  Palliative Care Outcomes  Clarified goals of care      Time In: 1030 Time Out: 1150 Time Total: 80 Greater than 50%  of this time was spent counseling and coordinating care related to the above assessment and plan.  Signed by: Micheline Rough, MD   Please contact Palliative Medicine Team phone at [phone] for questions and concerns.  For individual provider: See Shea Evans

## 2018-08-23 NOTE — Discharge Summary (Signed)
[facility] Discharge Summary  Patient name: [name] Medical record number: [id number] Date of birth: Nov 02, 1950 Age: 68 y.o. Gender: male Date of Admission: 08/22/2018  Date of Discharge: 08/26/18 Admitting Physician: Martyn Malay, MD  Primary Care Provider: Charlott Rakes, MD Consultants: neuro, GI   Indication for Hospitalization:  Kenneth Weldon Burlesonis a 68 y.o.malepresenting with symptomatic anemia. PMH is significant for COPD, GERD, HTN, HLD, T2DM, Prior stroke causing weakness/hemiparesisof left side.  Discharge Diagnoses/Problem List:  COPD GERD HTN T2DM Prior stroke causing weakness/hemiparesis of left side  Symptomatic anemia with 4uPRBC transfusion   Disposition: [employer]  Discharge Condition: Resolution of symptomatic anaemia  Discharge Exam:   General: More alert than before, pallor significantly improved, looks well, no acute distress, having bowel prep done with nurses HEENT: Neck non-tender without lymphadenopathy, masses or thyromegaly Cardio: Normal S1 and S2, no S3 or S4. Rhythm is regular. No murmurs or rubs.   Pulm: Clear to auscultation bilaterally, no crackles, wheezing, or diminished breath sounds. Normal respiratory effort Abdomen: Bowel sounds normal. Abdomen soft and non-tender.  Extremities: No peripheral edema. Warm/ well perfused.  Strong radial pulse. Neuro: Cranial nerves grossly intact   Brief Hospital Course:  Patient admitted [facility] with weakness and palecomplexion.Hb on admission 5.9. Iron studies showed iron wnl, low ferritin. Stool guiac was positive for blood.given Iv fluids in ED and his symptoms improved.Head CT showed no acute abnormality but a chronic right MCA infarct. CTA neck showed no large or medium vessel occlusion identified acutelybut did show decreased flow volumes. Neurology consulted in the ED who felt decrease cerebral perfusion 2/2 anemia from  acute GI bleed. Not had a colonoscopy in 30 years,unclear indication at that time. Per chart review, pt has refused screening colonoscopy since 2016. Pt was transfused 2 units of PRBC and repeat H&H showed Hb of 7.2 He was transfused a further 1 unit of RBC and his repeat H&H was 7.9. GI was consulted during his hospital stay who advised they would consider EGD and colonoscopy after the pt had been platelet free for 5 days. GI ordered a CT chest-abdo-pelvis which showed: Right upper lobe 1.9 by 1.5 cm pulmonary nodule, stable from 03/28/2018 but new compared to 12/12/2012. A slow growing neoplasm/malignancy is a distinct possibility. The lesion has lobular margins with some minimal spiculation. Although metastatic lesion is not excluded, lung primary is probably more likely. Abnormal wall thickening in the rectum, and a rectal mass with metastatic disease is likewise not excluded. MRI Head was ordered to rule out a stroke process which was queried on admission and to guide decision making about antiplatelet regime with regards to potential GI procedures. MRI head showed watershed pattern in the setting of severe anemia. GI did an endoscopy on 7/17 which showed: Normal esophagus, Nissen fundoplication wrap appears intact.A single non-bleeding angioectasia in the stomach. Treated with argon plasma coagulation(APC) Gastritis which was biopsied. Pt was advised to start on clear liquids today. Flexi sig showed four 3 to 6 mm polyps in the rectum and in the descending colon, removed with a cold snare. Resected and retrieved. Non-bleeding internal hemorrhoids. There was stool in the sigmoid colon and in the descending colon. Unable to exclude source of bleeding proximal to the mid descending colon. Gi discussed remaining  inpatient with attempt and repeat bowel preparation overnight and colonoscopy tomorrow, but [name] had a strong preference to be  discharged to home with plan for ongoing work-up as an outpatient.  GI discussed the upper endoscopy finding of a single medium sized gastric AVM, treated with APC. This certainly could  be the etiology of his IDA from occult GI blood  loss, and would be reasonable to continue surveillance as below.  His Hb on discharge is 9.9. [name] is medically stable for discharge.  Issues for Follow Up:  1. Pt has history of 2cm lung nodule discovered in 02/20. Pt will have a PET scan on discharge and outpatient oncology FU. Please kindly ensure this pt follows up with oncology as this is his wish. 2. K on discharge is 3.1. We have replaced this orally today. Please check K in 1-2 days in SNF or with PCP.  3. Hb was 5.9 on admission, post transfusion 9.9 on discharge. Please monitor Hb in community. Thank you 4. Pt has had clopidogrel stopped in hospital due to symptomatic anemia and risk of bleeding. We have now restarted his antiplatelet therapy on discharge-GI recommended. Would recommend a follow up discussion with risks and benefits of anticoagulation in setting of stroke risk vs bleeding risk. Continue palliative discussions recommended. 5. Pathology results for EGD and colonoscopy are still pending-please follow these results.  6. EGD found single medium sized gastric AVM, which was treated with APC. This should be continuously surveilled on discharge as per GI. 7. Plan for outpatient colonoscopy for polyp surveillance and completion of IDA work-up per patient preference. If the colonoscopy is otherwise unrevealing, and if IDA persists despite treatment of the gastric AVM, will plan for small bowel interrogation with VCE as an outpatient. 8. Please ensure pt gets CBC as outpatient in 7-10 days as per GI 9. Please repeat iron panel in 2-3 months as per GI.                            Significant Procedures:  EGD 7/17 Normal esophagus. A nissen fundoplication was found.The wrap appears intact. A single non-bleeding angioectasia in the stomach. Treated with argon  plasma coagulation  (APC). Gastritis-biopsied. Normal mucosa was found in the gastric fundus, in the gastric body and in the incisura-biopsied. Normal duodenal bulb, first portion of the duodenum and second portion of the duodenum-biopsied.  Flexi sig 7/17 Four 3 to 6 mm polyps in the rectum and in the descending colon, removed with a cold snare. Resected and retrieved. Non-bleeding internal hemorrhoids. Stool in the sigmoid colon and in the descending colon. The visualized mucosa was otherwise normal appearing in the rectum, in the recto-sigmoid colon, in the sigmoid colon and in the descending  colon. Unable to exclude source of bleeding proximal to the mid descending colon. Discussed remaining  inpatient with attempt and repeat bowel preparation overnight and colonoscopy tomorrow, but his strong  preference was to discharge to home with plan for ongoing work-up as an outpatient. Discussed the upper endoscopy finding of a single medium sized gastric AVM, treated with APC. This certainly could  be the etiology of his IDA from occult GI blood  loss, and would be reasonable to continue surveillance as below.  Significant Labs and Imaging:  Recent Labs  Lab 08/24/18 1514 08/25/18 0442 08/26/18 0626  WBC 7.8 8.9 9.3  HGB 9.8* 8.6* 9.9*  HCT 34.3* 30.5* 35.5*  PLT 191 167 181   Recent Labs  Lab 08/22/18 1430 08/22/18 1442 08/23/18 0513 08/24/18 0447 08/25/18 0442 08/26/18 0626  NA 144 145 147* 146* 144 148*  K 3.2* 3.2* 3.2* 3.5 3.5 3.1*  CL 113* 110 114* 115* 113* 114*  CO2 22  --  25 24 23 27   GLUCOSE 189* 183* 114* 102* 117* 93  BUN 8 6* <5* 5* 8 <5*  CREATININE 0.84 0.70 0.64 0.55* 0.67 0.61  CALCIUM 9.4  --  9.3 9.2 9.2 9.4  MG  --   --  1.9  --   --   --   ALKPHOS 67  --   --   --   --   --   AST 15  --   --   --   --   --   ALT 11  --   --   --   --   --   ALBUMIN 3.2*  --   --   --   --   --     Results/Tests Pending at Time of Discharge:  Gastric,duodenal and rectal  biopsy results from EGD on 7/17 Colonoscopy for pt on discharge as per GI   Discharge Medications:  Allergies as of 08/26/2018      Reactions   Darvocet [propoxyphene N-acetaminophen] Hives   Haldol [haloperidol Decanoate] Hives   Acetaminophen Nausea Only   Upset stomach, tolerates Hydrocodone/APAP if taken with food   Metformin And Related Nausea And Vomiting   Norco [hydrocodone-acetaminophen] Nausea And Vomiting   Tolerates if taken with food      Medication List    STOP taking these medications   aspirin 325 MG tablet   aspirin EC 81 MG tablet   baclofen 10 MG tablet Commonly known as: LIORESAL   diphenhydramine-acetaminophen 25-500 MG Tabs tablet Commonly known as: TYLENOL PM   hydrOXYzine 25 MG tablet Commonly known as: ATARAX/VISTARIL   nicotine 21 mg/24hr patch Commonly known as: NICODERM CQ - dosed in mg/24 hours   sennosides-docusate sodium 8.6-50 MG tablet Commonly known as: SENOKOT-S     TAKE these medications   acetaminophen 325 MG tablet Commonly known as: TYLENOL Take 2 tablets (650 mg total) by mouth every 4 (four) hours as needed for mild pain (or temp > 37.5 C (99.5 F)).   allopurinol 300 MG tablet Commonly known as: ZYLOPRIM Take 1 tablet (300 mg total) by mouth daily.   atorvastatin 80 MG tablet Commonly known as: LIPITOR Take 1 tablet (80 mg total) by mouth daily at 6 PM. What changed: when to take this   camphor-menthol lotion Commonly known as: SARNA Apply topically as needed for itching.   Cholecalciferol 25 MCG (1000 UT) tablet Take 1,000 Units by mouth daily.   clopidogrel 75 MG tablet Commonly known as: PLAVIX Take 1 tablet (75 mg total) by mouth daily.   Dermacloud Crea Apply 1 application topically 2 (two) times daily. Apply to buttocks   DULoxetine 60 MG capsule Commonly known as: Cymbalta Take 1 capsule (60 mg total) by mouth daily.   eucerin cream Apply 1 application topically daily.   ferrous sulfate 325 (65  FE) MG tablet Take 1 tablet (325 mg total) by mouth daily with breakfast. What changed: when to take this   folic acid 1 MG tablet Commonly known as: FOLVITE Take 1 tablet (1 mg total) by mouth daily.   guaiFENesin 600 MG 12 hr tablet Commonly known as: MUCINEX Take 1,200 mg by mouth 2 (two) times daily.   hydrocortisone 2.5 % cream Apply 1 application topically every 6 (six) hours as needed (itchiness).   levETIRAcetam 500 MG tablet Commonly known as: Keppra Take 1 tablet (500 mg total) by mouth  2 (two) times daily.   losartan 25 MG tablet Commonly known as: COZAAR Take 1 tablet (25 mg total) by mouth daily.   Melatonin 5 MG Tabs Take 5 mg by mouth at bedtime.   memantine 10 MG tablet Commonly known as: NAMENDA Take 1 tablet (10 mg total) by mouth 2 (two) times daily.   mometasone-formoterol 200-5 MCG/ACT Aero Commonly known as: DULERA Inhale 2 puffs into the lungs 2 (two) times daily.   pantoprazole 40 MG tablet Commonly known as: PROTONIX Take 1 tablet (40 mg total) by mouth daily.   polyethylene glycol 17 g packet Commonly known as: MIRALAX / GLYCOLAX Take 17 g by mouth daily as needed for moderate constipation.   pregabalin 150 MG capsule Commonly known as: LYRICA Take 1 capsule (150 mg total) by mouth 2 (two) times daily.   tiZANidine 2 MG tablet Commonly known as: ZANAFLEX Take 2 mg by mouth 3 (three) times daily.   traZODone 100 MG tablet Commonly known as: DESYREL Take 100 mg by mouth at bedtime.   Wixela Inhub 250-50 MCG/DOSE Aepb Generic drug: Fluticasone-Salmeterol Inhale 1-2 puffs into the lungs daily.       Discharge Instructions: Please refer to Patient Instructions section of EMR for full details.  Patient was counseled important signs and symptoms that should prompt return to medical care, changes in medications, dietary instructions, activity restrictions, and follow up appointments.   Follow-Up Appointments: Contact information for  after-discharge care    Destination    [facility] Preferred SNF .   Service: Skilled Nursing Contact information: [address] [address] [place], [place], DO 08/26/2018, 4:22 PM PGY-2, [place]

## 2018-08-23 NOTE — Progress Notes (Signed)
   08/23/18 0245  Provider Notification  Provider Name/Title Dr Ky Barban  Date Provider Notified 08/23/18  Time Provider Notified [phone]  Notification Type Page  Notification Reason Other (Comment) (positive MRSA in the nares)  Response See new orders  Date of Provider Response 08/23/18  Time of Provider Response [phone]

## 2018-08-23 NOTE — Consult Note (Addendum)
Consultation  Referring Provider: Medicine service/ Dr Kathleen Lime Primary Care Physician:  Charlott Rakes, MD Primary Gastroenterologist:  None/Unassigned  Reason for Consultation:  Profound anemia  HPI: [name] is a 68 y.o. male, nursing home resident who was admitted last evening with altered mental status, and apparently noted to have some right-sided weakness and slurred speech. Patient has multiple serious comorbidities including prior history of CVA with left-sided hemiparesis.  Patient is nonambulatory, has history of coronary artery disease, prior alcohol and polysubstance abuse, prior history of DVT, peripheral vascular disease, COPD, atrial fibrillation, diabetes mellitus and mild dementia. He had also recently been initiated for work-up of a right lung mass.  He was seen by Dr. Earlie Server as an outpatient in March 2020 and was scheduled for PET scan which has not been done.  The right upper lobe lesion is suspicious for a bronchogenic CA. CT scans done since yesterday's admission shows this to be a 2 cm mass spiculated right upper lobe highly likely to represent lung carcinoma.  Patient was also noted to be anemic on admission with hemoglobin 5.9 hematocrit 21.9 MCV of 74.  In March 2020 hemoglobin was 10.5 hematocrit 36.1. B12 and folate within normal limits, iron studies normal other than ferritin of 8.  Patient is alert and conversant today, CT of the head and CT Angio did not reveal any acute issues but did show severe chronic changes.  He does have occlusion of the right carotid stent . He denies any problems with abdominal pain, no complaints of nausea or vomiting, says his appetite has been fine.  He states he is able to swallow without any difficulty.  He is not aware of any melena or hematochezia but admits that he may not know. He has been on Plavix and aspirin 325 mg daily chronically.  He thinks that he did have one prior GI work-up greater than 20 years ago.   No GI records available in epic. Stool documented heme positive yesterday.  Past Medical History:  Diagnosis Date  . Acute blood loss anemia   . Acute CVA (cerebrovascular accident) ([place]) 08/27/2014  . Acute encephalopathy 11/28/2017  . Acute ischemic stroke ([place])   . AKI (acute kidney injury) ([place])   . Alcohol abuse    H/O  . Alcohol dependence with withdrawal with complication ([place]) 08/10/7791  . Anxiety   . Arterial ischemic stroke, MCA (middle cerebral artery), right, acute ([place])   . Arthritis   . Asthma   . Binocular vision disorder with diplopia 01/03/2013  . CAD (coronary artery disease) 05/27/10   Cath: severe single vessell CAD left cx midportion obtuse marginal 2 to 3.  . Cerebral infarction due to embolism of right middle cerebral artery ([place])   . Cerebral infarction due to stenosis of right carotid artery ([place]) 11/01/2014  . Cerebral thrombosis with cerebral infarction 07/18/2016  . Cerebrovascular accident (CVA) due to stenosis of right carotid artery ([place])   . CHF (congestive heart failure) ([place])   . Closed blow-out fracture of floor of orbit ([place]) 01/19/2013  . COPD (chronic obstructive pulmonary disease) ([place])   . COPD (chronic obstructive pulmonary disease) ([place])   . CVA (cerebral vascular accident) ([place]) 08/26/2014  . Dementia due to another medical condition ([place]) 07/12/2015  . Depression   . Diabetes mellitus type 2 in obese ([place])   . Diabetes mellitus, type 2 ([place]) 03/13/2015  . Diastolic CHF, acute on chronic (HCC) 11/01/2014  . DOE (dyspnea on exertion) 11/23/2014  .  DVT (deep venous thrombosis) ([place]) 02/22/2014  . Embolic stroke ([place])   . Enophthalmos due to trauma 01/19/2013  . Eyelid lesion 01/24/2013  . Fracture of inferior orbital wall ([place]) 01/03/2013  . Fracture of orbital floor ([place]) 01/03/2013  . GERD (gastroesophageal reflux disease)   . H/O total knee replacement 10/24/2014  . HCAP (healthcare-associated pneumonia) 10/15/2015  . Headache around the eyes  08/27/2014  . History of DVT (deep vein thrombosis)   . History of DVT of lower extremity   . Hyperkalemia 10/24/2014  . Hyperlipemia   . Hyperlipidemia   . Hypertension   . Hypoalbuminemia due to protein-calorie malnutrition ([place])   . Hypotension   . Labile blood pressure   . Left shoulder pain 09/28/2017  . Lower extremity edema 02/21/2014  . Medial orbital wall fracture 01/19/2013  . Myocardial infarction ([place]) 2000  . Orbital fracture 12/2012  . OSA on CPAP   . Pain of left eye 01/03/2013  . Peripheral vascular disease, unspecified ([place])    08/20/10 doppler: increase in right ABI post-op. Left ABI stable. S/P bi-fem bypass surgery  . Pneumonia 04/04/2012  . Right middle cerebral artery stroke ([place]) 01/18/2018  . Seizures ([place])   . Shortness of breath   . Stroke (cerebrum) ([place]) 02/06/2015  . Stroke ([place])   . Thrombocytopenia ([place])   . Tobacco abuse   . Trichiasis without entropion 04/16/2013   Overview:  LLL central   . Type 2 diabetes mellitus with peripheral neuropathy ([place]) 03/13/2015  . Urinary incontinence 03/14/2015  . Urinary incontinence 03/14/2015    Past Surgical History:  Procedure Laterality Date  . abdoninal ao angio & bifem angio  05/27/10   Patent graft, occluded bil stents with no retrograde flow into the hypogastric arteries. 100% occl left ant. tibial artery, 70% to 80% to 100% stenosis right superficial fem artery above adductor canal. 100 % occl right ant. tibial vessell  . Aortogram w/ PTCA  09/292003  . BACK SURGERY    . CARDIAC CATHETERIZATION  05/27/10   severe CAD left cx  . CHOLECYSTECTOMY    . ESOPHAGOGASTRIC FUNDOPLICATION    . EYE SURGERY    . FEMORAL BYPASS  08/19/10   Right Fem-Pop  . IR ANGIO INTRA EXTRACRAN SEL COM CAROTID INNOMINATE BILAT MOD SED  07/20/2016  . IR ANGIO INTRA EXTRACRAN SEL COM CAROTID INNOMINATE BILAT MOD SED  01/14/2018  . IR ANGIO VERTEBRAL SEL SUBCLAVIAN INNOMINATE UNI R MOD SED  07/20/2016  . IR ANGIO VERTEBRAL SEL VERTEBRAL  BILAT MOD SED  01/14/2018  . IR ANGIO VERTEBRAL SEL VERTEBRAL UNI L MOD SED  07/20/2016  . IR RADIOLOGIST EVAL & MGMT  05/27/2016  . IR US GUIDE VASC ACCESS RIGHT  01/14/2018  . JOINT REPLACEMENT    . PERIPHERAL VASCULAR CATHETERIZATION N/A 12/10/2014   Procedure: Abdominal Aortogram;  Surgeon: Angelia Mould, MD;  Location: [facility];  Service: Cardiovascular;  Laterality: N/A;  . RADIOLOGY WITH ANESTHESIA N/A 02/08/2015   Procedure: RADIOLOGY WITH ANESTHESIA;  Surgeon: Luanne Bras, MD;  Location: [facility];  Service: Radiology;  Laterality: N/A;  . TEE WITHOUT CARDIOVERSION N/A 08/29/2014   Procedure: TRANSESOPHAGEAL ECHOCARDIOGRAM (TEE);  Surgeon: Josue Hector, MD;  Location: [facility];  Service: Cardiovascular;  Laterality: N/A;  . TOTAL KNEE ARTHROPLASTY      Prior to Admission medications   Medication Sig Start Date End Date Taking? Authorizing Provider  allopurinol (ZYLOPRIM) 300 MG tablet Take 1 tablet (300 mg total)  by mouth daily. 04/01/18  Yes Daisy Floro, DO  aspirin EC 81 MG tablet Take 81 mg by mouth daily.   Yes [provider]  atorvastatin (LIPITOR) 80 MG tablet Take 1 tablet (80 mg total) by mouth daily at 6 PM. Patient taking differently: Take 80 mg by mouth daily.  04/01/18  Yes Daisy Floro, DO  camphor-menthol [facility]) lotion Apply topically as needed for itching. 04/01/18  Yes Daisy Floro, DO  Cholecalciferol 25 MCG (1000 UT) tablet Take 1,000 Units by mouth daily.   Yes [provider]  clopidogrel (PLAVIX) 75 MG tablet Take 1 tablet (75 mg total) by mouth daily. 04/01/18  Yes Daisy Floro, DO  DULoxetine (CYMBALTA) 60 MG capsule Take 1 capsule (60 mg total) by mouth daily. 04/01/18  Yes Milus Banister C, DO  ferrous sulfate 325 (65 FE) MG tablet Take 325 mg by mouth 2 (two) times daily.   Yes [provider]  folic acid (FOLVITE) 1 MG tablet Take 1 tablet (1 mg total) by mouth daily. 04/01/18  Yes  Daisy Floro, DO  guaiFENesin (MUCINEX) 600 MG 12 hr tablet Take 1,200 mg by mouth 2 (two) times daily.   Yes [provider]  hydrocortisone 2.5 % cream Apply 1 application topically every 6 (six) hours as needed (itchiness).   Yes [provider]  Infant Care Products (DERMACLOUD) CREA Apply 1 application topically 2 (two) times daily. Apply to buttocks   Yes [provider]  levETIRAcetam (KEPPRA) 500 MG tablet Take 1 tablet (500 mg total) by mouth 2 (two) times daily. 04/01/18  Yes Milus Banister C, DO  losartan (COZAAR) 25 MG tablet Take 1 tablet (25 mg total) by mouth daily. 04/02/18  Yes Daisy Floro, DO  Melatonin 5 MG TABS Take 5 mg by mouth at bedtime.   Yes [provider]  memantine (NAMENDA) 10 MG tablet Take 1 tablet (10 mg total) by mouth 2 (two) times daily. 04/01/18  Yes Milus Banister C, DO  mometasone-formoterol (DULERA) 200-5 MCG/ACT AERO Inhale 2 puffs into the lungs 2 (two) times daily. 04/01/18  Yes Milus Banister C, DO  pantoprazole (PROTONIX) 40 MG tablet Take 1 tablet (40 mg total) by mouth daily. 04/01/18  Yes Milus Banister C, DO  polyethylene glycol (MIRALAX / GLYCOLAX) 17 g packet Take 17 g by mouth daily as needed for moderate constipation.   Yes [provider]  pregabalin (LYRICA) 150 MG capsule Take 1 capsule (150 mg total) by mouth 2 (two) times daily. 04/01/18  Yes Milus Banister C, DO  sennosides-docusate sodium (SENOKOT-S) 8.6-50 MG tablet Take 2 tablets by mouth at bedtime.   Yes [provider]  Skin Protectants, Misc. (EUCERIN) cream Apply 1 application topically daily.   Yes [provider]  tiZANidine (ZANAFLEX) 2 MG tablet Take 2 mg by mouth 3 (three) times daily.   Yes [provider]  traZODone (DESYREL) 100 MG tablet Take 100 mg by mouth at bedtime.   Yes [provider]  Grant Ruts INHUB 250-50 MCG/DOSE AEPB Inhale 1-2 puffs into the lungs daily. 04/01/18  Yes  Daisy Floro, DO  acetaminophen (TYLENOL) 325 MG tablet Take 2 tablets (650 mg total) by mouth every 4 (four) hours as needed for mild pain (or temp > 37.5 C (99.5 F)). 04/01/18   Daisy Floro, DO  aspirin 325 MG tablet Take 1 tablet (325 mg total) by mouth daily. Patient not taking: Reported on 08/22/2018 04/01/18  Milus Banister C, DO  baclofen (LIORESAL) 10 MG tablet Take 1 tablet (10 mg total) by mouth at bedtime. Patient not taking: Reported on 08/22/2018 04/01/18   Daisy Floro, DO  diphenhydramine-acetaminophen (TYLENOL PM) 25-500 MG TABS tablet Take 1 tablet by mouth at bedtime as needed. Patient not taking: Reported on 08/22/2018 04/01/18   Milus Banister C, DO  hydrOXYzine (ATARAX/VISTARIL) 25 MG tablet Take 1 tablet (25 mg total) by mouth every 8 (eight) hours as needed for itching. Patient not taking: Reported on 08/22/2018 04/01/18   Daisy Floro, DO  nicotine (NICODERM CQ - DOSED IN MG/24 HOURS) 21 mg/24hr patch Place 1 patch (21 mg total) onto the skin daily. Patient not taking: Reported on 08/22/2018 04/01/18   Daisy Floro, DO    Current Facility-Administered Medications  Medication Dose Route Frequency Provider Last Rate Last Dose  . allopurinol (ZYLOPRIM) tablet 300 mg  300 mg Oral Daily Rory Percy, DO      . atorvastatin (LIPITOR) tablet 80 mg  80 mg Oral q1800 Rumball, Alison, DO      . camphor-menthol [facility]) lotion   Topical PRN Rory Percy, DO      . Chlorhexidine Gluconate Cloth 2 % PADS 6 each  6 each Topical Q0600 Martyn Malay, MD   6 each at 08/23/18 0617  . cholecalciferol (VITAMIN D3) tablet 1,000 Units  1,000 Units Oral Daily Rory Percy, DO      . DULoxetine (CYMBALTA) DR capsule 60 mg  60 mg Oral Daily Rumball, Alison, DO      . folic acid (FOLVITE) tablet 1 mg  1 mg Oral Daily Rumball, Alison, DO      . guaiFENesin (MUCINEX) 12 hr tablet 1,200 mg  1,200 mg Oral BID Rory Percy, DO   1,200 mg at 08/22/18 2258  .  hydrocerin (EUCERIN) cream 1 application  1 application Topical Daily Rumball, Alison, DO      . hydrocortisone cream 1 % 1 application  1 application Topical A5W PRN Rory Percy, DO      . levETIRAcetam (KEPPRA) tablet 500 mg  500 mg Oral BID Rory Percy, DO   500 mg at 08/22/18 2259  . Melatonin TABS 6 mg  6 mg Oral QHS Rory Percy, DO   6 mg at 08/22/18 2259  . memantine (NAMENDA) tablet 10 mg  10 mg Oral BID Rory Percy, DO   10 mg at 08/22/18 2300  . mometasone-formoterol (DULERA) 200-5 MCG/ACT inhaler 2 puff  2 puff Inhalation BID Rory Percy, DO   2 puff at 08/23/18 0851  . mupirocin ointment (BACTROBAN) 2 % 1 application  1 application Nasal BID Martyn Malay, MD   1 application at 97/94/80 0336  . pantoprazole (PROTONIX) 80 mg in sodium chloride 0.9 % 250 mL (0.32 mg/mL) infusion  8 mg/hr Intravenous Continuous Rory Percy, DO 25 mL/hr at 08/23/18 0620 8 mg/hr at 08/23/18 0620  . pregabalin (LYRICA) capsule 150 mg  150 mg Oral BID Rory Percy, DO   150 mg at 08/22/18 2257  . tiZANidine (ZANAFLEX) tablet 2 mg  2 mg Oral TID Rory Percy, DO   2 mg at 08/22/18 2257  . traZODone (DESYREL) tablet 100 mg  100 mg Oral QHS Rory Percy, DO   100 mg at 08/22/18 2259    Allergies as of 08/22/2018 - Review Complete 08/22/2018  Allergen Reaction Noted  . Darvocet [propoxyphene n-acetaminophen] Hives 08/28/2010  . Haldol [haloperidol decanoate] Hives 08/28/2010  . Acetaminophen  Nausea Only 11/01/2014  . Metformin and related Nausea And Vomiting 10/15/2015  . Norco [hydrocodone-acetaminophen] Nausea And Vomiting 12/07/2014    Family History  Problem Relation Age of Onset  . Heart attack Mother   . Heart failure Mother   . Cirrhosis Father   . Heart failure Brother   . Cancer Brother   . Hypertension Neg Hx        UNKNOWN  . Stroke Neg Hx        UNKNOWN    Social History   Social History   Tobacco Use  . Smoking status: Current Every Day Smoker     Packs/day: 0.50    Years: 50.00    Pack years: 25.00    Types: Cigars, Cigarettes    Start date: 02/09/1961  . Smokeless tobacco: Former Systems developer    Types: Chew  . Tobacco comment: 2 ppd, full flavor  Substance Use Topics  . Alcohol use: Yes    Comment: occasionally, "I might drink 1 every 6 months"  . Drug use: Yes    Types: Cocaine        Review of Systems: Pertinent positive and negative review of systems were noted in the above HPI section.  All other review of systems was otherwise negative.  Physical Exam: Vital signs in last 24 hours: Temp:  [98 F (36.7 C)-99.3 F (37.4 C)] 98 F (36.7 C) (07/14 0916) Pulse Rate:  [78-99] 81 (07/14 0916) Resp:  [9-27] 18 (07/14 0916) BP: (93-147)/(52-92) 133/52 (07/14 0916) SpO2:  [94 %-100 %] 96 % (07/14 0916) Weight:  [98 kg-99.3 kg] 99.3 kg (07/13 2200)   General:   Alert,  Well-developed, well-nourished, chronically ill-appearing elderly white male, in bed, able to converse but falls asleep quickly Head:  Normocephalic and atraumatic. Eyes:  Sclera clear, no icterus.   Conjunctiva pale Ears:  Normal auditory acuity. Nose:  No deformity, discharge,  or lesions. Mouth:  No deformity or lesions.   Neck:  Supple; no masses or thyromegaly. Lungs:  Clear throughout to auscultation.   No wheezes, crackles, or rhonchi.  Heart ;irregular rate and rhythm; no murmurs, clicks, rubs,  or gallops. Abdomen:  Soft,nontender, BS active, there is fullness in the lower abdomen suprapubic and to the right Msk:  Symmetrical without gross deformities. . Pulses:  Normal pulses noted. Extremities:  Without clubbing or edema. Neurologic:  Alert and  oriented x3; left hemiparesis Skin:  Intact without significant lesions or rashes.. Psych:  Alert and cooperative.   Intake/Output from previous day: 07/13 0701 - 07/14 0700 In: 958.5 [I.V.:328.5; Blood:630] Out: 400 [Urine:400]  Lab Results: Recent Labs    08/22/18 1430 08/22/18 1442 08/23/18  0513 08/23/18 0816  WBC 5.9  --  5.4  --   HGB 5.9* 6.8* 6.7* 7.2*  HCT 22.9* 20.0* 24.7* 26.9*  PLT 214  --  188  --    BMET Recent Labs    08/22/18 1430 08/22/18 1442 08/23/18 0513  NA 144 145 147*  K 3.2* 3.2* 3.2*  CL 113* 110 114*  CO2 22  --  25  GLUCOSE 189* 183* 114*  BUN 8 6* <5*  CREATININE 0.84 0.70 0.64  CALCIUM 9.4  --  9.3   LFT Recent Labs    08/22/18 1430  PROT 5.9*  ALBUMIN 3.2*  AST 15  ALT 11  ALKPHOS 67  BILITOT 0.8   PT/INR Recent Labs    08/22/18 1430  LABPROT 14.9  INR 1.2  IMPRESSION:  #50 68 year old white male, admitted yesterday with apparent new right-sided weakness and slurred speech.  This is in the setting of previous CVA for which he says he had a left hemiparesis and has been bedbound.  Per neurology his CT perfusion pattern is suggestive of global hypoperfusion, not felt to be a candidate for TPA or EVT symptoms felt likely secondary to systemic hypotension leading to cerebral hypoperfusion, perhaps component secondary to profound anemia.  MRI is being considered  Patient apparently recently was transitioned to hospice but as of today's note per the hospice team he says he does not want to have hospice care at this time.  #2 Marked anemia with 2.5 g drop in hemoglobin over the past 3 months, patient documented heme positive, no history of any obvious melena or hematochezia.  Patient has not had any prior GI evaluation. Ferritin 8  Rule out occult upper versus lower GI source.  #3 probable right upper lobe lung cancer-seen Dr. Earlie Server as an outpatient needs PET scan  #4 chronic antiplatelet therapy/on Plavix and high-dose aspirin #5 peripheral vascular disease #6.  History of atrial fibrillation #7.  Coronary artery disease #8  Diabetes mellitus #9 COPD #10.  Prior history of EtOH and polysubstance abuse   PLAN: Difficult situation with his multiple issues.  EGD and colonoscopy can be done, however patient will  need to be off of Plavix for at least 5 days prior to procedures.  If he has just had any sort of mild neurologic insult, holding antiplatelet therapy may not be appropriate just yet.  Transfuse patient to keep hemoglobin 8 Start oral iron therapy Consider MRI of the head as suggested by neurology Oncology consultation to expedite any other work-up of probable lung malignancy.  We will discuss, and GI will follow along    Amy Montague  08/23/2018, 10:09 AM    [facility] Attending   I have taken an interval history, reviewed the chart and examined the patient. I agree with the Advanced Practitioner's note, impression and recommendations.   This is quite complicated and his anti-PLT tx is needed and makes endoscopic evaluations higher risk. Could be done w/o holding clopidogrel but not ideal.  As above but will  1) give feraheme x 1 2) Do chest/abpelvic CT as a a survey since cannot get PET scan as inpatient and need to triage things better - seems unlikely but could he have a lung met as opposed to primary. 3) Next steps pending 2 and clinical course  Gatha Mayer, MD, [facility] Gastroenterology 08/23/2018 4:22 PM Pager [phone]

## 2018-08-23 NOTE — TOC Initial Note (Signed)
Transition of Care [facility]) - Initial/Assessment Note    Patient Details  Name: [name] MRN: [id number] Date of Birth: 10-05-1950  [facility] CM/SW Contact:    Pollie Friar, RN Phone Number: 08/23/2018, 2:19 PM  Clinical Narrative:                 Pt is from [facility]. Pt was to start hospice at the facility but was admitted to the hospital. Currently the patient is refusing hospice. TOC following with plans to return to [facility] when medically ready. Pt is in agreement.   Expected Discharge Plan: Skilled Nursing Facility Barriers to Discharge: Continued Medical Work up   Patient Goals and CMS Choice        Expected Discharge Plan and Services Expected Discharge Plan: [place] In-house Referral: Clinical Social Work Discharge Planning Services: CM Consult   Living arrangements for the past 2 months: [place]                                      Prior Living Arrangements/Services Living arrangements for the past 2 months: [place] Lives with:: Facility Resident Patient language and need for interpreter reviewed:: Yes(no needs) Do you feel safe going back to the place where you live?: Yes      Need for Family Participation in Patient Care: Yes (Comment)     Criminal Activity/Legal Involvement Pertinent to Current Situation/Hospitalization: No - Comment as needed  Activities of Daily Living Home Assistive Devices/Equipment: Wheelchair ADL Screening (condition at time of admission) Patient's cognitive ability adequate to safely complete daily activities?: Yes Is the patient deaf or have difficulty hearing?: No Does the patient have difficulty seeing, even when wearing glasses/contacts?: No Does the patient have difficulty concentrating, remembering, or making decisions?: No Patient able to express need for assistance with ADLs?: Yes Does the patient have difficulty dressing or bathing?:  Yes Independently performs ADLs?: No Communication: Independent Dressing (OT): Needs assistance Is this a change from baseline?: Pre-admission baseline Grooming: Needs assistance Is this a change from baseline?: Pre-admission baseline Feeding: Needs assistance Is this a change from baseline?: Pre-admission baseline Bathing: Needs assistance Is this a change from baseline?: Pre-admission baseline Toileting: Needs assistance Is this a change from baseline?: Pre-admission baseline In/Out Bed: Dependent Is this a change from baseline?: Pre-admission baseline Walks in Home: Dependent Is this a change from baseline?: Pre-admission baseline Does the patient have difficulty walking or climbing stairs?: Yes Weakness of Legs: Left Weakness of Arms/Hands: Left  Permission Sought/Granted                  Emotional Assessment     Affect (typically observed): Accepting Orientation: : Oriented to Self, Oriented to Place      Admission diagnosis:  Symptomatic anemia [D64.9] Patient Active Problem List   Diagnosis Date Noted  . Symptomatic anemia 08/22/2018  . IV site infection ([place]) 03/31/2018  . Left leg weakness   . Stenosis of brachiocephalic artery ([place]) 17/49/4496  . Lung nodule, solitary 03/28/2018  . Solitary pulmonary nodule on lung CT   . Normocytic anemia 03/26/2018  . Abdominal pain, acute, left upper quadrant   . Dysarthria   . Recurrent abdominal pain   . Dementia due to another medical condition, without behavioral disturbance ([place])   . Pruritus   . Left hemiplegia ([place])   . Insomnia 01/29/2016  .  Psoriasiform dermatitis 07/12/2015  . Type 2 diabetes mellitus with peripheral neuropathy ([place]) 03/13/2015  . History of Hemiparesis affecting left side as late effect of cerebrovascular accident ([place]) 02/18/2015  . Coronary artery disease involving native coronary artery of native heart without angina pectoris   . History of stroke   . PAF (paroxysmal atrial  fibrillation) ([place]) 11/23/2014  . HLD (hyperlipidemia) 11/01/2014  . Tobacco use disorder 11/01/2014  . Carotid stenosis 11/01/2014  . Essential hypertension   . Polysubstance abuse ([place]) 08/28/2014  . Cocaine abuse ([place])   . Intracranial carotid stenosis   . Alcohol use disorder, moderate, dependence ([place])   . Stroke ([place])   . Chronic diastolic congestive heart failure ([place]) 11/21/2013  . GERD (gastroesophageal reflux disease) 08/10/2013  . Gout 08/10/2013  . Peripheral vascular disease ([place]) 07/27/2012  . COPD (chronic obstructive pulmonary disease) ([place]) 04/04/2012   PCP:  Charlott Rakes, MD Pharmacy:   [facility] [place], [place] - [address] [place] [address] Phone: [phone] Fax: [phone]     Social Determinants of Health ([place]) Interventions    Readmission Risk Interventions No flowsheet data found.

## 2018-08-23 NOTE — Consult Note (Signed)
   [facility] CM Inpatient Consult   08/23/2018  ABBOTT JASINSKI 1950/04/04 [id number]    Patient screened for potential [facility] Care Management services due to 28% high risk score for unplanned readmission and hospitalizations; under his [employer].  Chart review and MD note today, show as follows: Duane Lope Burlesonis a 68 y.o.malepresenting with symptomatic anemia. PMH is significant for COPD, GERD, HTN, HLD, T2DM, Prior stroke causing weakness/ hemiparesisof left side.  Primary care provider is Dr. Charlott Rakes with [facility] and [facility].  Review of transition of care CM note, reveals that: patient is from [facility] (SNF) and was to start hospice at the facility but was admitted to the hospital and currently  refusing hospice. Transition of care CM following with plans for patient to return to [facility]) when medically ready, and patient is in agreement.     Will follow along for progress and disposition, and if there are changes incurrentdisposition, pleaserefer to [facility] care management forfollow-upof needsas appropriate.  Of note, [facility] Care Management services does not replace or interfere with any services that are arranged by transition of care case management or social work.   For questions and referral, please call:  Edwena Felty A. Jazalynn Mireles, BSN, RN-BC [facility] Liaison Cell: [phone]

## 2018-08-23 NOTE — Progress Notes (Signed)
I responded to a [place] to assist the patient in making sure the HCPOA was not out of date. I visited the patient's room and talked with him about the Advance Directive in detail. After our discussion, he decided that an update was not necessary at this time and that it should remain the same. I shared words of encouragement and told him that the Chaplain is available for additional support as needed or requested.    08/23/18 1746  Clinical Encounter Type  Visited With Patient  Visit Type Follow-up;Spiritual support  Referral From Nurse  Consult/Referral To Chaplain  Spiritual Encounters  Spiritual Needs Literature    Chaplain Dr. Redgie Grayer

## 2018-08-23 NOTE — Progress Notes (Signed)
Manufacturing engineer [facility]) Hospice  Pt was to begin hospice in his LTC facility, [facility], this week.  Pt was subsequently transported to [facility] ED for evaluation of stroke like symptoms prior to hospice services beginning.   Patient has decided he does not want hospice at this time.   Please contact [place] if you have questions or if patient decides he does want hospice at discharge.    Thank you, Farrel Gordon, RN, [place] (in [place]) [phone]

## 2018-08-24 DIAGNOSIS — R933 Abnormal findings on diagnostic imaging of other parts of digestive tract: Secondary | ICD-10-CM

## 2018-08-24 DIAGNOSIS — D509 Iron deficiency anemia, unspecified: Secondary | ICD-10-CM

## 2018-08-24 LAB — BASIC METABOLIC PANEL
Anion gap: 7 (ref 5–15)
BUN: 5 mg/dL — ABNORMAL LOW (ref 8–23)
CO2: 24 mmol/L (ref 22–32)
Calcium: 9.2 mg/dL (ref 8.9–10.3)
Chloride: 115 mmol/L — ABNORMAL HIGH (ref 98–111)
Creatinine, Ser: 0.55 mg/dL — ABNORMAL LOW (ref 0.61–1.24)
GFR calc Af Amer: >60 mL/min (ref >60)
GFR calc non Af Amer: >60 mL/min (ref >60)
Glucose, Bld: 102 mg/dL — ABNORMAL HIGH (ref 70–99)
Potassium: 3.5 mmol/L (ref 3.5–5.1)
Sodium: 146 mmol/L — ABNORMAL HIGH (ref 135–145)

## 2018-08-24 LAB — CBC
HCT: 24.7 % — ABNORMAL LOW (ref 39.0–52.0)
HCT: 30.5 % — ABNORMAL LOW (ref 39.0–52.0)
HCT: 34.3 % — ABNORMAL LOW (ref 39.0–52.0)
Hemoglobin: 6.7 g/dL — CL (ref 13.0–17.0)
Hemoglobin: 8.7 g/dL — ABNORMAL LOW (ref 13.0–17.0)
Hemoglobin: 9.8 g/dL — ABNORMAL LOW (ref 13.0–17.0)
MCH: 20.6 pg — ABNORMAL LOW (ref 26.0–34.0)
MCH: 22.3 pg — ABNORMAL LOW (ref 26.0–34.0)
MCH: 22.4 pg — ABNORMAL LOW (ref 26.0–34.0)
MCHC: 27.1 g/dL — ABNORMAL LOW (ref 30.0–36.0)
MCHC: 28.5 g/dL — ABNORMAL LOW (ref 30.0–36.0)
MCHC: 28.6 g/dL — ABNORMAL LOW (ref 30.0–36.0)
MCV: 75.8 fL — ABNORMAL LOW (ref 80.0–100.0)
MCV: 78.2 fL — ABNORMAL LOW (ref 80.0–100.0)
MCV: 78.3 fL — ABNORMAL LOW (ref 80.0–100.0)
Platelets: 188 10*3/uL (ref 150–400)
Platelets: 164 10*3/uL (ref 150–400)
Platelets: 191 10*3/uL (ref 150–400)
RBC: 3.26 MIL/uL — ABNORMAL LOW (ref 4.22–5.81)
RBC: 3.9 MIL/uL — ABNORMAL LOW (ref 4.22–5.81)
RBC: 4.38 MIL/uL (ref 4.22–5.81)
RDW: 17.9 % — ABNORMAL HIGH (ref 11.5–15.5)
RDW: 18.2 % — ABNORMAL HIGH (ref 11.5–15.5)
RDW: 18.6 % — ABNORMAL HIGH (ref 11.5–15.5)
WBC: 5.4 10*3/uL (ref 4.0–10.5)
WBC: 6 10*3/uL (ref 4.0–10.5)
WBC: 7.8 10*3/uL (ref 4.0–10.5)
nRBC: 0 % (ref 0.0–0.2)
nRBC: 0 % (ref 0.0–0.2)
nRBC: 0.4 % — ABNORMAL HIGH (ref 0.0–0.2)

## 2018-08-24 MED ORDER — PANTOPRAZOLE SODIUM 40 MG PO TBEC
40.0000 mg | DELAYED_RELEASE_TABLET | Freq: Every day | ORAL | Status: DC
  Administered 2018-08-25 – 2018-08-26 (×2): 40 mg via ORAL
  Filled 2018-08-24 (×2): qty 1

## 2018-08-24 MED ORDER — BISACODYL 5 MG PO TBEC
10.0000 mg | DELAYED_RELEASE_TABLET | Freq: Once | ORAL | Status: AC
  Administered 2018-08-25: 10 mg via ORAL
  Filled 2018-08-24: qty 2

## 2018-08-24 MED ORDER — FERROUS SULFATE 325 (65 FE) MG PO TABS
325.0000 mg | ORAL_TABLET | Freq: Every day | ORAL | Status: DC
  Administered 2018-08-25 – 2018-08-26 (×2): 325 mg via ORAL
  Filled 2018-08-24 (×2): qty 1

## 2018-08-24 NOTE — Progress Notes (Signed)
Kenneth Haw, MD PGY-1  Repeat Hb post 4 units RBC: 9.8

## 2018-08-24 NOTE — Progress Notes (Signed)
R midline site assessment: notified by pt's RN that catheter was occluded.  Unable to flush or draw blood, line adjustment ineffective. Midline removed. Catheter noted to be kinked at distal third of catheter. No fracture noted.  R upper medial arm site unremarkable, pressure dressing applied. Pt's nurse Sian, made aware. No need for new site at this time.

## 2018-08-24 NOTE — Progress Notes (Addendum)
Patient ID: [name], male   DOB: [birth year], 68 y.o.   MRN: [id number]    Progress Note   Subjective  Day # 2 Hgb 8.7 post transfusions On plavix  CT abd/pelvis and chest - RUL nodule 1.9 x 1.5 cm - slow growing neoplasm distinct possibility, few paratracheal nodes upper size normal  Abnormal wall thickening of rectum, rectal mass cannot be excluded Prior fundoplication noted  Aortobifem graft stent graft - Occluded ?  He is feeling ok today , conversant, denies right sided weakness- unaware of any recent bleeding, no c/o abdominal  pain     Objective   Vital signs in last 24 hours: Temp:  [97 F (36.1 C)-98.4 F (36.9 C)] 97.5 F (36.4 C) (07/15 0826) Pulse Rate:  [62-80] 62 (07/15 0826) Resp:  [16-20] 18 (07/15 0826) BP: (112-148)/(48-68) 148/64 (07/15 0826) SpO2:  [95 %-100 %] 95 % (07/15 0826) Last BM Date: (pt could not remember) General:    Elderly WM in NAD Heart:  Regular rate and rhythm; no murmurs Lungs: Respirations even and unlabored, lungs CTA bilaterally Abdomen:  Soft, ,nondistended.no palp mass or HSM Normal bowel sounds. Extremities:  Without edema. Neurologic:  Alert and oriented, left hemiparesis , slight facial droop on Right Psych:  Cooperative. Normal mood and affect.  Intake/Output from previous day: 07/14 0701 - 07/15 0700 In: 997.4 [I.V.:367.4; Blood:630] Out: 400 [Urine:400] Intake/Output this shift: No intake/output data recorded.  Lab Results: Recent Labs    08/22/18 1430  08/23/18 0513 08/23/18 0816 08/23/18 1838 08/24/18 0447  WBC 5.9  --  5.4  --   --  6.0  HGB 5.9*   < > 6.7* 7.2* 7.9* 8.7*  HCT 22.9*   < > 24.7* 26.9* 27.9* 30.5*  PLT 214  --  188  --   --  164   < > = values in this interval not displayed.   BMET Recent Labs    08/22/18 1430 08/22/18 1442 08/23/18 0513 08/24/18 0447  NA 144 145 147* 146*  K 3.2* 3.2* 3.2* 3.5  CL 113* 110 114* 115*  CO2 22  --  25 24  GLUCOSE 189* 183* 114* 102*  BUN 8 6*  <5* 5*  CREATININE 0.84 0.70 0.64 0.55*  CALCIUM 9.4  --  9.3 9.2   LFT Recent Labs    08/22/18 1430  PROT 5.9*  ALBUMIN 3.2*  AST 15  ALT 11  ALKPHOS 67  BILITOT 0.8   PT/INR Recent Labs    08/22/18 1430  LABPROT 14.9  INR 1.2    Studies/Results: Ct Angio Head W Or Wo Contrast  Result Date: 08/22/2018 CLINICAL DATA:  Right-sided weakness and slurred speech. EXAM: CT ANGIOGRAPHY HEAD AND NECK CT PERFUSION BRAIN TECHNIQUE: Multidetector CT imaging of the head and neck was performed using the standard protocol during bolus administration of intravenous contrast. Multiplanar CT image reconstructions and MIPs were obtained to evaluate the vascular anatomy. Carotid stenosis measurements (when applicable) are obtained utilizing NASCET criteria, using the distal internal carotid diameter as the denominator. Multiphase CT imaging of the brain was performed following IV bolus contrast injection. Subsequent parametric perfusion maps were calculated using RAPID software. CONTRAST:  35mL OMNIPAQUE IOHEXOL 350 MG/ML SOLN COMPARISON:  03/25/2018.  December 2019. head CT earlier today. FINDINGS: CTA NECK FINDINGS Aortic arch: Advanced aortic atherosclerosis with irregular plaque particularly at the arch. Branching pattern is normal. 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 Right carotid system: Common  carotid artery shows extensive soft and calcified plaque but is sufficiently patent to the distal common carotid artery where there is a carotid artery stent. There is flow in the external carotid artery but no flow in the internal carotid artery due to chronic occlusion at this level. Left carotid system: Common carotid artery shows some atherosclerotic plaque along its course but no stenosis proximal to the bifurcation. There is calcified plaque at the carotid bifurcation and ICA bulb. Minimal diameter is 4 mm, the same as the more distal cervical ICA, therefore no stenosis.  Vertebral arteries: Right vertebral artery origin shows calcified plaque with stenosis of 50%. Left vertebral artery origin shows calcified plaque with stenosis also estimated at 50%. Both vertebral arteries are patent through the cervical region, with foci of atherosclerotic calcification resulting in 30-50% narrowing. No critical vertebral stenosis is seen. Skeleton: Ordinary spondylosis.  Chronic fusion C2-3. Other neck: No mass or lymphadenopathy. Upper chest: 2 cm spiculated mass in the right upper lobe highly likely to represent lung carcinoma. This has not changed much since February but was not present in 2014. Review of the MIP images confirms the above findings CTA HEAD FINDINGS Anterior circulation: Left internal carotid artery is patent through the skull base and siphon region. There is siphon atherosclerotic calcification with stenosis estimated at 50%. Left anterior and middle cerebral vessels show flow, with mild atherosclerotic irregularity but no evidence of large or medium vessel occlusion. The right internal carotid artery does not show flow at the skull base. There is a stent of the carotid siphon region. There is reconstitution of the supraclinoid ICA probably from a combination of external to internal collaterals and flow through a patent anterior communicating artery. Flow is present within the right anterior and middle cerebral vessels, though the caliber is diminished when compared to the right side. No change since the previous study. Posterior circulation: Both vertebral arteries are patent through the foramen magnum to the basilar. No basilar stenosis. Posterior circulation branch vessels are patent. Atherosclerotic irregularity of the PCA branches. Venous sinuses: Patent and normal. Anatomic variants: None significant. Delayed phase: Not performed. Review of the MIP images confirms the above findings CT Brain Perfusion Findings: Severely motion degraded exam, without useful data because  of this. IMPRESSION: No large or medium vessel occlusion identified acutely. Perfusion imaging is severely degraded by motion and not useful. Advanced chronic atherosclerotic disease of the aorta. 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 Right carotid siphon stent without proximal flow. Flow in the left anterior and middle cerebral arteries likely due to a patent anterior communicating artery. Diminished caliber related to diminished flow volume. No acute finding however. Left carotid bifurcation atherosclerotic disease with no measurable stenosis. No left anterior circulation large or medium vessel occlusion. 50% stenoses at both vertebral artery origins. Serial focal stenoses throughout both vertebral arteries, but without critical stenosis. Perfusion study suffers from severe motion and is not usable. 2 cm spiculated mass in the right upper lobe consistent with lung carcinoma. Little change since February. However, the lesion was not present in 2014. These results were communicated to Dr. Rory Percy at 3:45 pmon 7/13/2020by text page via the [facility] messaging system. Electronically Signed   By: Nelson Chimes M.D.   On: 08/22/2018 15:56   Ct Angio Neck W Or Wo Contrast  Result Date: 08/22/2018 CLINICAL DATA:  Right-sided weakness and slurred speech. EXAM: CT ANGIOGRAPHY HEAD AND NECK CT PERFUSION  BRAIN TECHNIQUE: Multidetector CT imaging of the head and neck was performed using the standard protocol during bolus administration of intravenous contrast. Multiplanar CT image reconstructions and MIPs were obtained to evaluate the vascular anatomy. Carotid stenosis measurements (when applicable) are obtained utilizing NASCET criteria, using the distal internal carotid diameter as the denominator. Multiphase CT imaging of the brain was performed following IV bolus contrast injection. Subsequent parametric perfusion maps were calculated using RAPID  software. CONTRAST:  74mL OMNIPAQUE IOHEXOL 350 MG/ML SOLN COMPARISON:  03/25/2018.  December 2019. head CT earlier today. FINDINGS: CTA NECK FINDINGS Aortic arch: Advanced aortic atherosclerosis with irregular plaque particularly at the arch. Branching pattern is normal. 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 Right carotid system: Common carotid artery shows extensive soft and calcified plaque but is sufficiently patent to the distal common carotid artery where there is a carotid artery stent. There is flow in the external carotid artery but no flow in the internal carotid artery due to chronic occlusion at this level. Left carotid system: Common carotid artery shows some atherosclerotic plaque along its course but no stenosis proximal to the bifurcation. There is calcified plaque at the carotid bifurcation and ICA bulb. Minimal diameter is 4 mm, the same as the more distal cervical ICA, therefore no stenosis. Vertebral arteries: Right vertebral artery origin shows calcified plaque with stenosis of 50%. Left vertebral artery origin shows calcified plaque with stenosis also estimated at 50%. Both vertebral arteries are patent through the cervical region, with foci of atherosclerotic calcification resulting in 30-50% narrowing. No critical vertebral stenosis is seen. Skeleton: Ordinary spondylosis.  Chronic fusion C2-3. Other neck: No mass or lymphadenopathy. Upper chest: 2 cm spiculated mass in the right upper lobe highly likely to represent lung carcinoma. This has not changed much since February but was not present in 2014. Review of the MIP images confirms the above findings CTA HEAD FINDINGS Anterior circulation: Left internal carotid artery is patent through the skull base and siphon region. There is siphon atherosclerotic calcification with stenosis estimated at 50%. Left anterior and middle cerebral vessels show flow, with mild atherosclerotic irregularity but no evidence of  large or medium vessel occlusion. The right internal carotid artery does not show flow at the skull base. There is a stent of the carotid siphon region. There is reconstitution of the supraclinoid ICA probably from a combination of external to internal collaterals and flow through a patent anterior communicating artery. Flow is present within the right anterior and middle cerebral vessels, though the caliber is diminished when compared to the right side. No change since the previous study. Posterior circulation: Both vertebral arteries are patent through the foramen magnum to the basilar. No basilar stenosis. Posterior circulation branch vessels are patent. Atherosclerotic irregularity of the PCA branches. Venous sinuses: Patent and normal. Anatomic variants: None significant. Delayed phase: Not performed. Review of the MIP images confirms the above findings CT Brain Perfusion Findings: Severely motion degraded exam, without useful data because of this. IMPRESSION: No large or medium vessel occlusion identified acutely. Perfusion imaging is severely degraded by motion and not useful. Advanced chronic atherosclerotic disease of the aorta. 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 Right carotid siphon stent without proximal flow. Flow in the left anterior and middle cerebral arteries likely due to a patent anterior communicating artery. Diminished caliber related to diminished flow volume. No acute finding however. Left carotid bifurcation  atherosclerotic disease with no measurable stenosis. No left anterior circulation large or medium vessel occlusion. 50% stenoses at both vertebral artery origins. Serial focal stenoses throughout both vertebral arteries, but without critical stenosis. Perfusion study suffers from severe motion and is not usable. 2 cm spiculated mass in the right upper lobe consistent with lung carcinoma. Little change since  February. However, the lesion was not present in 2014. These results were communicated to Dr. Rory Percy at 3:45 pmon 7/13/2020by text page via the [facility] messaging system. Electronically Signed   By: Nelson Chimes M.D.   On: 08/22/2018 15:56   Ct Chest W Contrast  Result Date: 08/24/2018 CLINICAL DATA:  Lung nodule.  Iron deficiency anemia. EXAM: CT CHEST, ABDOMEN, AND PELVIS WITH CONTRAST TECHNIQUE: Multidetector CT imaging of the chest, abdomen and pelvis was performed following the standard protocol during bolus administration of intravenous contrast. CONTRAST:  164mL OMNIPAQUE IOHEXOL 300 MG/ML  SOLN COMPARISON:  Multiple exams, including CT chest from 03/28/2018 and CT scan from 12/12/2012 FINDINGS: CT CHEST FINDINGS Cardiovascular: Coronary, aortic arch, and branch vessel atherosclerotic vascular disease. Mild cardiomegaly. Small pericardial effusion. Mediastinum/Nodes: Right paratracheal node 0.9 cm on image 15/3, formerly 0.7 cm. Lower right paratracheal node 1.0 cm on image 1/3, formerly 0.9 cm. A lower paratracheal node adjacent to the right mainstem bronchus measures 0.9 cm short axis on image 25/3, stable. Lungs/Pleura: Right upper lobe noncalcified pulmonary nodule measures 1.9 by 1.5 cm on image 66/4. This is stable compared to 03/28/2018 but back on 12/12/2012 there is no readily appreciable nodule in this vicinity. There is a separate calcified granuloma in the right upper lobe. Bandlike scarring or atelectasis in both lower lobes dependently. Trace left pleural effusion. Musculoskeletal: Mild thoracic spondylosis. CT ABDOMEN PELVIS FINDINGS Hepatobiliary: Cholecystectomy.  Otherwise unremarkable. Pancreas: Unremarkable Spleen: Unremarkable Adrenals/Urinary Tract: 0.9 by 1.4 cm nodule of the apex of the left adrenal gland, not changed from 12/12/2012 hence considered benign. A 0.8 cm hypodense exophytic lesion of left mid kidney posteriorly on image 72/3 is probably a cyst but technically too small to  characterize. Vascular calcifications are noted at the renal hila. Excreted contrast medium is present in the urinary bladder, likely from yesterday's CT angiography. Stomach/Bowel: There is evidence of a prior fundoplication at the hiatus. Diffuse wall thickening is present in the rectum with single wall thickness approximately 10 mm on image 118/3. Vascular/Lymphatic: Aortoiliac atherosclerotic vascular disease. Aortobifemoral stent graft. This appears patent. By femoral graft with a midline stent, this by femoral graft appears occluded. Probably occluded femoral to distal grafts. Reproductive: Speckled calcifications in the prostate gland. Other: No supplemental non-categorized findings. Musculoskeletal: Mild lumbar spondylosis and degenerative disc disease. IMPRESSION: 1. Right upper lobe 1.9 by 1.5 cm pulmonary nodule, stable from 03/28/2018 but new compared to 12/12/2012. A slow growing neoplasm/malignancy is a distinct possibility. The lesion has lobular margins with some minimal spiculation. Although metastatic lesion is not excluded, lung primary is probably more likely. 2. That said, there is abnormal wall thickening in the rectum, and a rectal mass with metastatic disease is likewise not excluded. 3. Other imaging findings of potential clinical significance: Aortic Atherosclerosis (ICD10-I70.0). Coronary atherosclerosis. Mild cardiomegaly. Small pericardial effusion. Upper normal sized right paratracheal lymph nodes. Chronically stable small left adrenal nodule, likely benign. Prior fundoplication. Aortobifemoral stent graft is patent. By femoral graft in femoral to distal grafts are likely occluded. Electronically Signed   By: Van Clines M.D.   On: 08/24/2018 09:05   Ct Abdomen Pelvis W Contrast  Result Date: 08/24/2018 CLINICAL DATA:  Lung nodule.  Iron deficiency anemia. EXAM: CT CHEST, ABDOMEN, AND PELVIS WITH CONTRAST TECHNIQUE: Multidetector CT imaging of the chest, abdomen and pelvis  was performed following the standard protocol during bolus administration of intravenous contrast. CONTRAST:  138mL OMNIPAQUE IOHEXOL 300 MG/ML  SOLN COMPARISON:  Multiple exams, including CT chest from 03/28/2018 and CT scan from 12/12/2012 FINDINGS: CT CHEST FINDINGS Cardiovascular: Coronary, aortic arch, and branch vessel atherosclerotic vascular disease. Mild cardiomegaly. Small pericardial effusion. Mediastinum/Nodes: Right paratracheal node 0.9 cm on image 15/3, formerly 0.7 cm. Lower right paratracheal node 1.0 cm on image 1/3, formerly 0.9 cm. A lower paratracheal node adjacent to the right mainstem bronchus measures 0.9 cm short axis on image 25/3, stable. Lungs/Pleura: Right upper lobe noncalcified pulmonary nodule measures 1.9 by 1.5 cm on image 66/4. This is stable compared to 03/28/2018 but back on 12/12/2012 there is no readily appreciable nodule in this vicinity. There is a separate calcified granuloma in the right upper lobe. Bandlike scarring or atelectasis in both lower lobes dependently. Trace left pleural effusion. Musculoskeletal: Mild thoracic spondylosis. CT ABDOMEN PELVIS FINDINGS Hepatobiliary: Cholecystectomy.  Otherwise unremarkable. Pancreas: Unremarkable Spleen: Unremarkable Adrenals/Urinary Tract: 0.9 by 1.4 cm nodule of the apex of the left adrenal gland, not changed from 12/12/2012 hence considered benign. A 0.8 cm hypodense exophytic lesion of left mid kidney posteriorly on image 72/3 is probably a cyst but technically too small to characterize. Vascular calcifications are noted at the renal hila. Excreted contrast medium is present in the urinary bladder, likely from yesterday's CT angiography. Stomach/Bowel: There is evidence of a prior fundoplication at the hiatus. Diffuse wall thickening is present in the rectum with single wall thickness approximately 10 mm on image 118/3. Vascular/Lymphatic: Aortoiliac atherosclerotic vascular disease. Aortobifemoral stent graft. This appears  patent. By femoral graft with a midline stent, this by femoral graft appears occluded. Probably occluded femoral to distal grafts. Reproductive: Speckled calcifications in the prostate gland. Other: No supplemental non-categorized findings. Musculoskeletal: Mild lumbar spondylosis and degenerative disc disease. IMPRESSION: 1. Right upper lobe 1.9 by 1.5 cm pulmonary nodule, stable from 03/28/2018 but new compared to 12/12/2012. A slow growing neoplasm/malignancy is a distinct possibility. The lesion has lobular margins with some minimal spiculation. Although metastatic lesion is not excluded, lung primary is probably more likely. 2. That said, there is abnormal wall thickening in the rectum, and a rectal mass with metastatic disease is likewise not excluded. 3. Other imaging findings of potential clinical significance: Aortic Atherosclerosis (ICD10-I70.0). Coronary atherosclerosis. Mild cardiomegaly. Small pericardial effusion. Upper normal sized right paratracheal lymph nodes. Chronically stable small left adrenal nodule, likely benign. Prior fundoplication. Aortobifemoral stent graft is patent. By femoral graft in femoral to distal grafts are likely occluded. Electronically Signed   By: Van Clines M.D.   On: 08/24/2018 09:05   Ct Cerebral Perfusion W Contrast  Result Date: 08/22/2018 CLINICAL DATA:  Right-sided weakness and slurred speech. EXAM: CT ANGIOGRAPHY HEAD AND NECK CT PERFUSION BRAIN TECHNIQUE: Multidetector CT imaging of the head and neck was performed using the standard protocol during bolus administration of intravenous contrast. Multiplanar CT image reconstructions and MIPs were obtained to evaluate the vascular anatomy. Carotid stenosis measurements (when applicable) are obtained utilizing NASCET criteria, using the distal internal carotid diameter as the denominator. Multiphase CT imaging of the brain was performed following IV bolus contrast injection. Subsequent parametric perfusion  maps were calculated using RAPID software. CONTRAST:  54mL OMNIPAQUE IOHEXOL 350 MG/ML SOLN COMPARISON:  03/25/2018.  December 2019. head CT earlier today. FINDINGS: CTA NECK FINDINGS Aortic arch: Advanced aortic atherosclerosis with irregular plaque particularly at the arch. Branching pattern is normal. 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 Right carotid system: Common carotid artery shows extensive soft and calcified plaque but is sufficiently patent to the distal common carotid artery where there is a carotid artery stent. There is flow in the external carotid artery but no flow in the internal carotid artery due to chronic occlusion at this level. Left carotid system: Common carotid artery shows some atherosclerotic plaque along its course but no stenosis proximal to the bifurcation. There is calcified plaque at the carotid bifurcation and ICA bulb. Minimal diameter is 4 mm, the same as the more distal cervical ICA, therefore no stenosis. Vertebral arteries: Right vertebral artery origin shows calcified plaque with stenosis of 50%. Left vertebral artery origin shows calcified plaque with stenosis also estimated at 50%. Both vertebral arteries are patent through the cervical region, with foci of atherosclerotic calcification resulting in 30-50% narrowing. No critical vertebral stenosis is seen. Skeleton: Ordinary spondylosis.  Chronic fusion C2-3. Other neck: No mass or lymphadenopathy. Upper chest: 2 cm spiculated mass in the right upper lobe highly likely to represent lung carcinoma. This has not changed much since February but was not present in 2014. Review of the MIP images confirms the above findings CTA HEAD FINDINGS Anterior circulation: Left internal carotid artery is patent through the skull base and siphon region. There is siphon atherosclerotic calcification with stenosis estimated at 50%. Left anterior and middle cerebral vessels show flow, with mild atherosclerotic  irregularity but no evidence of large or medium vessel occlusion. The right internal carotid artery does not show flow at the skull base. There is a stent of the carotid siphon region. There is reconstitution of the supraclinoid ICA probably from a combination of external to internal collaterals and flow through a patent anterior communicating artery. Flow is present within the right anterior and middle cerebral vessels, though the caliber is diminished when compared to the right side. No change since the previous study. Posterior circulation: Both vertebral arteries are patent through the foramen magnum to the basilar. No basilar stenosis. Posterior circulation branch vessels are patent. Atherosclerotic irregularity of the PCA branches. Venous sinuses: Patent and normal. Anatomic variants: None significant. Delayed phase: Not performed. Review of the MIP images confirms the above findings CT Brain Perfusion Findings: Severely motion degraded exam, without useful data because of this. IMPRESSION: No large or medium vessel occlusion identified acutely. Perfusion imaging is severely degraded by motion and not useful. Advanced chronic atherosclerotic disease of the aorta. 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 Right carotid siphon stent without proximal flow. Flow in the left anterior and middle cerebral arteries likely due to a patent anterior communicating artery. Diminished caliber related to diminished flow volume. No acute finding however. Left carotid bifurcation atherosclerotic disease with no measurable stenosis. No left anterior circulation large or medium vessel occlusion. 50% stenoses at both vertebral artery origins. Serial focal stenoses throughout both vertebral arteries, but without critical stenosis. Perfusion study suffers from severe motion and is not usable. 2 cm spiculated mass in the right upper lobe consistent with lung  carcinoma. Little change since February. However, the lesion was not present in 2014. These results were communicated to Dr. Rory Percy at 3:45 pmon 7/13/2020by text page via the [facility] messaging system.  Electronically Signed   By: Nelson Chimes M.D.   On: 08/22/2018 15:56   Ct Head Code Stroke Wo Contrast  Result Date: 08/22/2018 CLINICAL DATA:  Code stroke. Altered level of consciousness. Right-sided deficit. Slurred speech EXAM: CT HEAD WITHOUT CONTRAST TECHNIQUE: Contiguous axial images were obtained from the base of the skull through the vertex without intravenous contrast. COMPARISON:  CT head 01/12/2018 FINDINGS: Brain: Mild atrophy. Ventricle size normal. Chronic right MCA infarct over the convexity is unchanged. Negative for acute infarct, hemorrhage, or mass.  No midline shift. Vascular: Negative for hyperdense vessel Skull: Negative Sinuses/Orbits: Surgical repair of left orbital floor fracture. Mucosal edema left maxillary sinus. Bilateral cataract surgery. Other: None ASPECTS ([place] Stroke Program Early CT Score) - Ganglionic level infarction (caudate, lentiform nuclei, internal capsule, insula, M1-M3 cortex): 7 - Supraganglionic infarction (M4-M6 cortex): 3 Total score (0-10 with 10 being normal): 10 IMPRESSION: 1. No acute abnormality 2. Chronic right MCA infarct 3. ASPECTS is 10 4. These results were called by telephone at the time of interpretation on 08/22/2018 at 2:45 pm to Dr. Rory Percy, who verbally acknowledged these results. Electronically Signed   By: Franchot Gallo M.D.   On: 08/22/2018 14:45       Assessment / Plan:     #1 68 yo WM, NH resident with hx CVA/left hemiparesis admitted with AMS and right sided weakness, dysarthria - multiple co morbidities, On ASA /Plavis  Right sided sxs have resolved -  Neurology feels sxs are secondary to global hypoperfusion in setting of severe vascular  disease and anemia.-  MRI head ordered  For today  #2 Fe deficiency anemia , heme positive - no  overt GI bleeding R/O occult upper vs lower GI source   Ct abd/pelvis was done to further evaluate - abnormal Rectum , cannot r/o mass   Will discuss with Dr Carlean Purl - may need to pursue at least Flex , and if he is to be sedated consider EGD and Colon  Plavix had not been held until today - so will need to discuss timing  #2 Possible lung Ca - CT chest yesterday no change since earlier this year  #3 Afib #4 PVD #5 CAD #6 COPD #7 DM #8hx polysubstance abuse  Palliative care consult yesterday - pt des not want hospice , and desires more agrressive care     LOS: 2 days   Amy Esterwood  08/24/2018, 10:20 AM    [facility] Attending   I have taken an interval history, reviewed the chart and examined the patient. I agree with the Advanced Practitioner's note, impression and recommendations.    CT scan showed a thickened rectum, I concur with the radiologist impression.  Rectal exam does not show a mass.  I does not rule out a rectal cancer but he probably has a primary lung lesion as opposed to metastatic disease from the GI tract.  We still do not know why he is iron deficient though could be nutritional.  He was heme positive so chronic blood loss is a possibility.  As I look at him more I think we might be able to get him prepped for colonoscopy, and once we see the MRI results may go ahead and see what we can do about that as far as doing an EGD and a colonoscopy.  He has not had a bowel movement since admission, he uses lactulose at home he says or in the nursing home, I am going to give him an enema tonight  and some Dulcolax in the morning. And put him on clear liquids in the morning  We will follow-up tomorrow and come up with a plan regarding endoscopic investigation i.e. will we do that.  It is okay for him to remain on his clopidogrel in my opinion though there are some limitations and increased risks with that.  It still an option to consider just a flex sig to further  evaluate this rectal area that was abnormal on the CT scan as digital exam is only so accurate.  Gatha Mayer, MD, [facility] 08/24/2018 4:48 PM Pager [phone]

## 2018-08-24 NOTE — Progress Notes (Signed)
Family Medicine Teaching Service Daily Progress Note Intern Pager: [phone]  Patient name: [name] Medical record number: [id number] Date of birth: 1950-05-16 Age: 68 y.o. Gender: male  Primary Care Provider: Charlott Rakes, MD Consultants: GI Code Status: DNR from previous admission   Pt Overview and Major Events to Date:  [name] is a 68 y.o. male presenting with symptomatic anemia . PMH is significant for COPD, GERD, HTN, HLD, T2DM, Prior stroke causing weakness/hemiparysis of left side.  Assessment and Plan:  Symptomatic anemia 2/2 GI bleed Hb today post 4 units RBC 8.7.( Hb on admission 5.9. Iron studies showed iron wnl, low ferritin) Stool guiac was positive for blood - continue to monitor Hb - continue to monitor vitals - SCDs  - continue protonix gtt - am CBC/BMP - GI recommendations: many thanks for recs. Holding clopidogrel. Considering endoscopic evaluation                                         CT chest abdo pelvis-for ?lung ca -palliative consulted-pt not interested in hospice currently. Would like to active investigations of low Hb.      Chronic Diastolic CHF Previous echo in Feb 2952 - normal systolic fx with 84-13% ejection fraction with impaired relaxation.  - Monitor fluid status  CAD, PVD With remote h/o central burning chest pain a few weeks ago, self resolved, no longer has pain. Atypical for ACS. EKG reviewed without ST changes.  - no current indication for cardiac w/u - monitor, continue home lipitor. Restart losartan as BP tolerates.  Dementia Patient on Namenda at home. Per chart review also has dementia-related depression, takes cymbalta - Continue home meds  Previous stroke/Hemiparesis of left side Patient with prior stroke in 2017 that lead to weakness in his left side. R sided dominance. -monitor right sided weakness, if does not improve with transfusion, consider further evaluation with MRI  - hold aspirin and  clopidogrel - continue lipitor  HTN BP today 128/57 Normotensive on admission. Home medication - losartan - Hold home Losartan. Can likely restart tomorrow after transfusion if BP tolerates. - Monitor blood pressures  HLD Home medications include lipitor  - Continue lipitor  T2DM - diet controlled Last A1C 5.7 in February 2020, 6.2 in 2018 CBG today 102 - Monitor with BMP  COPD with lung nodule c/f malignancy sats 95% on room air  On Dulera as home med. Follows with Oncology outpatient for 2cm lung nodule discovered 03/2018 when hospitalized for stroke, previously 0.9cm 01/2018. Last visit with Onc 04/2018 with plans for outpatient PET scan and biopsy of nodule, however patient cancelled per chart review. Per chart review, pt mentioned about 50lb weight loss in 6 mths at [facility] visit. - Monitor respiratory status   -Continue Dulera -CT chest abdomen pelvis yesterday requested by GI-check results   GERD: Home medications include protonix -Continue protonix gtt  Hx of seizure disorder: on keppra at home. Unknown last seizure. Continue home keppra  H/o polysubstance use Prior h/o extensive tobacco use (2-3 packs of cigars per day for 60 years). Prior alcohol use, last drink February 2020. Last had crack cocaine in February 2020. - monitor  H/o DVT Very limited mobility. No signs of DVT on exam on admission.  -monitor for DVT   FEN/GI: NPO, sips with meds PPx: SCDs  Disposition: pending resolution of medical condition   Subjective:  Pt very  sleepy again. Denies chest pain, dyspnea nausea or vomiting. Feels better post transfusion. No new concerns.    Objective: Temp:  [97 F (36.1 C)-98.4 F (36.9 C)] 97.6 F (36.4 C) (07/15 0435) Pulse Rate:  [66-91] 66 (07/15 0435) Resp:  [16-20] 18 (07/15 0435) BP: (112-133)/(48-68) 128/57 (07/15 0435) SpO2:  [95 %-100 %] 96 % (07/15 0435)   General:sleepy but cooperative and appears to be in no acute distress HEENT:  Neck non-tender without lymphadenopathy, masses or thyromegaly Cardio: Normal S1 and S2, no S3 or S4. Rhythm is regular. No murmurs or rubs.   Pulm: Clear to auscultation bilaterally, no crackles, wheezing, or diminished breath sounds. Normal respiratory effort Abdomen: Bowel sounds normal. Abdomen soft, generalized tenderness. No guarding  Extremities:  SCDs on bilaterally. Unable to assess for edema. Warm/ well perfused.  Strong radial pulse. Neuro: Cranial nerves grossly intact  Laboratory: Recent Labs  Lab 08/22/18 1430  08/23/18 0513 08/23/18 0816 08/23/18 1838 08/24/18 0447  WBC 5.9  --  5.4  --   --  6.0  HGB 5.9*   < > 6.7* 7.2* 7.9* 8.7*  HCT 22.9*   < > 24.7* 26.9* 27.9* 30.5*  PLT 214  --  188  --   --  164   < > = values in this interval not displayed.   Recent Labs  Lab 08/22/18 1430 08/22/18 1442 08/23/18 0513 08/24/18 0447  NA 144 145 147* 146*  K 3.2* 3.2* 3.2* 3.5  CL 113* 110 114* 115*  CO2 22  --  25 24  BUN 8 6* <5* 5*  CREATININE 0.84 0.70 0.64 0.55*  CALCIUM 9.4  --  9.3 9.2  PROT 5.9*  --   --   --   BILITOT 0.8  --   --   --   ALKPHOS 67  --   --   --   ALT 11  --   --   --   AST 15  --   --   --   GLUCOSE 189* 183* 114* 102*      Imaging/Diagnostic Tests: No results found.  Lattie Haw, MD 08/24/2018, 6:54 AM PGY-1, [facility] Intern pager: [phone], text pages welcome

## 2018-08-24 NOTE — Progress Notes (Signed)
Kenneth Haw MD PGY-1  Telephone call to [name]. I clarified that he would like [name] as his power of attorney.

## 2018-08-24 NOTE — NC FL2 (Signed)
[place] LEVEL OF CARE SCREENING TOOL     IDENTIFICATION  Patient Name: [name] Birthdate: [birth year] Sex: male Admission Date (Current Location): 08/22/2018  [facility] and [place] Number:  Herbalist and Address:  [facility] [place] [place] [address] [place], [place] [address]      Provider Number: [id number]  Attending Physician Name and Address:  Leeanne Rio, MD  Relative Name and Phone Number:       Current Level of Care: Hospital Recommended Level of Care: [place] Prior Approval Number:    Date Approved/Denied:   PASRR Number:    Discharge Plan: SNF    Current Diagnoses: Patient Active Problem List   Diagnosis Date Noted  . Heme + stool   . Lung mass   . Symptomatic anemia 08/22/2018  . IV site infection ([place]) 03/31/2018  . Left leg weakness   . Stenosis of brachiocephalic artery ([place]) 41/28/7867  . Lung nodule, solitary 03/28/2018  . Solitary pulmonary nodule on lung CT   . Normocytic anemia 03/26/2018  . Abdominal pain, acute, left upper quadrant   . Dysarthria   . Recurrent abdominal pain   . Dementia due to another medical condition, without behavioral disturbance ([place])   . Pruritus   . Left hemiplegia ([place])   . Insomnia 01/29/2016  . Psoriasiform dermatitis 07/12/2015  . Type 2 diabetes mellitus with peripheral neuropathy ([place]) 03/13/2015  . History of Hemiparesis affecting left side as late effect of cerebrovascular accident ([place]) 02/18/2015  . Coronary artery disease involving native coronary artery of native heart without angina pectoris   . History of stroke   . PAF (paroxysmal atrial fibrillation) ([place]) 11/23/2014  . HLD (hyperlipidemia) 11/01/2014  . Tobacco use disorder 11/01/2014  . Carotid stenosis 11/01/2014  . Essential hypertension   . Polysubstance abuse ([place]) 08/28/2014  . Cocaine abuse ([place])   . Intracranial carotid stenosis   . Alcohol use disorder,  moderate, dependence ([place])   . Stroke ([place])   . Chronic diastolic congestive heart failure ([place]) 11/21/2013  . GERD (gastroesophageal reflux disease) 08/10/2013  . Gout 08/10/2013  . Peripheral vascular disease ([place]) 07/27/2012  . COPD (chronic obstructive pulmonary disease) ([place]) 04/04/2012    Orientation RESPIRATION BLADDER Height & Weight     Self, Time, Situation, Place  Normal Incontinent Weight: 218 lb 14.7 oz (99.3 kg) Height:  6' (182.9 cm)  BEHAVIORAL SYMPTOMS/MOOD NEUROLOGICAL BOWEL NUTRITION STATUS      Continent Diet(see DC summary)  AMBULATORY STATUS COMMUNICATION OF NEEDS Skin   Extensive Assist Verbally Normal                       Personal Care Assistance Level of Assistance  Bathing, Feeding, Dressing Bathing Assistance: Maximum assistance Feeding assistance: Limited assistance Dressing Assistance: Maximum assistance     Functional Limitations Info  Sight, Speech, Hearing Sight Info: Adequate Hearing Info: Adequate Speech Info: Impaired(dysarthria)    SPECIAL CARE FACTORS FREQUENCY                       Contractures Contractures Info: Not present    Additional Factors Info  Code Status, Allergies, Psychotropic Code Status Info: DNR Allergies Info: Darvocet Propoxyphene N-acetaminophen, Haldol Haloperidol Decanoate, Acetaminophen, Metformin And Related, Norco Hydrocodone-acetaminophen Psychotropic Info: Cymbalta 60mg  daily         Current Medications (08/24/2018):  This is the current hospital active medication list Current Facility-Administered  Medications  Medication Dose Route Frequency Provider Last Rate Last Dose  . allopurinol (ZYLOPRIM) tablet 300 mg  300 mg Oral Daily Rory Percy, DO   300 mg at 08/24/18 0935  . atorvastatin (LIPITOR) tablet 80 mg  80 mg Oral q1800 Rory Percy, DO   80 mg at 08/23/18 1723  . camphor-menthol (SARNA) lotion   Topical PRN Rory Percy, DO      . Chlorhexidine Gluconate Cloth 2 % PADS 6  each  6 each Topical Q0600 Martyn Malay, MD   6 each at 08/24/18 [phone]  . cholecalciferol (VITAMIN D3) tablet 1,000 Units  1,000 Units Oral Daily Rory Percy, DO   1,000 Units at 08/24/18 0937  . DULoxetine (CYMBALTA) DR capsule 60 mg  60 mg Oral Daily Rory Percy, DO   60 mg at 08/24/18 0936  . folic acid (FOLVITE) tablet 1 mg  1 mg Oral Daily Rory Percy, DO   1 mg at 08/24/18 0923  . guaiFENesin (MUCINEX) 12 hr tablet 1,200 mg  1,200 mg Oral BID Rory Percy, DO   1,200 mg at 08/24/18 3007  . hydrocerin (EUCERIN) cream 1 application  1 application Topical Daily Rory Percy, DO   1 application at 62/26/33 0937  . hydrocortisone cream 1 % 1 application  1 application Topical H5K PRN Rory Percy, DO      . levETIRAcetam (KEPPRA) tablet 500 mg  500 mg Oral BID Rory Percy, DO   500 mg at 08/24/18 0936  . Melatonin TABS 6 mg  6 mg Oral QHS Rory Percy, DO   6 mg at 08/23/18 2136  . memantine (NAMENDA) tablet 10 mg  10 mg Oral BID Rory Percy, DO   10 mg at 08/24/18 1039  . mometasone-formoterol (DULERA) 200-5 MCG/ACT inhaler 2 puff  2 puff Inhalation BID Rory Percy, DO   2 puff at 08/24/18 0820  . mupirocin ointment (BACTROBAN) 2 % 1 application  1 application Nasal BID Martyn Malay, MD   1 application at 56/25/63 [phone]  . pantoprazole (PROTONIX) 80 mg in sodium chloride 0.9 % 250 mL (0.32 mg/mL) infusion  8 mg/hr Intravenous Continuous Rory Percy, DO 25 mL/hr at 08/23/18 2102 8 mg/hr at 08/23/18 2102  . pregabalin (LYRICA) capsule 150 mg  150 mg Oral BID Rory Percy, DO   150 mg at 08/24/18 0935  . sodium chloride flush (NS) 0.9 % injection 10-40 mL  10-40 mL Intracatheter Q12H Leeanne Rio, MD   10 mL at 08/24/18 0945  . sodium chloride flush (NS) 0.9 % injection 10-40 mL  10-40 mL Intracatheter PRN Leeanne Rio, MD      . tiZANidine (ZANAFLEX) tablet 2 mg  2 mg Oral TID Rory Percy, DO   2 mg at 08/24/18 0936  . traZODone  (DESYREL) tablet 100 mg  100 mg Oral QHS Rory Percy, DO   100 mg at 08/23/18 2135     Discharge Medications: Please see discharge summary for a list of discharge medications.  Relevant Imaging Results:  Relevant Lab Results:   Additional Information SS#: [id number]  Geralynn Ochs, [place]

## 2018-08-25 ENCOUNTER — Inpatient Hospital Stay (HOSPITAL_COMMUNITY): Payer: Medicare Other

## 2018-08-25 DIAGNOSIS — D508 Other iron deficiency anemias: Secondary | ICD-10-CM

## 2018-08-25 LAB — TYPE AND SCREEN
ABO/RH(D): O POS
Antibody Screen: NEGATIVE
Unit division: 0
Unit division: 0
Unit division: 0
Unit division: 0

## 2018-08-25 LAB — BPAM RBC
Blood Product Expiration Date: 202008112359
Blood Product Expiration Date: 202008122359
Blood Product Expiration Date: 202008122359
Blood Product Expiration Date: 202008142359
ISSUE DATE / TIME: 202007131652
ISSUE DATE / TIME: 202007132320
ISSUE DATE / TIME: 202007141211
ISSUE DATE / TIME: 202007150031
Unit Type and Rh: 5100
Unit Type and Rh: 5100
Unit Type and Rh: 5100
Unit Type and Rh: 5100

## 2018-08-25 LAB — BASIC METABOLIC PANEL
Anion gap: 8 (ref 5–15)
BUN: 8 mg/dL (ref 8–23)
CO2: 23 mmol/L (ref 22–32)
Calcium: 9.2 mg/dL (ref 8.9–10.3)
Chloride: 113 mmol/L — ABNORMAL HIGH (ref 98–111)
Creatinine, Ser: 0.67 mg/dL (ref 0.61–1.24)
GFR calc Af Amer: >60 mL/min (ref >60)
GFR calc non Af Amer: >60 mL/min (ref >60)
Glucose, Bld: 117 mg/dL — ABNORMAL HIGH (ref 70–99)
Potassium: 3.5 mmol/L (ref 3.5–5.1)
Sodium: 144 mmol/L (ref 135–145)

## 2018-08-25 LAB — CBC
HCT: 30.5 % — ABNORMAL LOW (ref 39.0–52.0)
Hemoglobin: 8.6 g/dL — ABNORMAL LOW (ref 13.0–17.0)
MCH: 22.2 pg — ABNORMAL LOW (ref 26.0–34.0)
MCHC: 28.2 g/dL — ABNORMAL LOW (ref 30.0–36.0)
MCV: 78.6 fL — ABNORMAL LOW (ref 80.0–100.0)
Platelets: 167 10*3/uL (ref 150–400)
RBC: 3.88 MIL/uL — ABNORMAL LOW (ref 4.22–5.81)
RDW: 19 % — ABNORMAL HIGH (ref 11.5–15.5)
WBC: 8.9 10*3/uL (ref 4.0–10.5)
nRBC: 0.3 % — ABNORMAL HIGH (ref 0.0–0.2)

## 2018-08-25 MED ORDER — PEG-KCL-NACL-NASULF-NA ASC-C 100 G PO SOLR: 0.5000 | Freq: Once | ORAL | Status: DC

## 2018-08-25 MED ORDER — METOCLOPRAMIDE HCL 5 MG/ML IJ SOLN
10.0000 mg | Freq: Once | INTRAMUSCULAR | Status: AC
  Administered 2018-08-25: 10 mg via INTRAVENOUS
  Filled 2018-08-25: qty 2

## 2018-08-25 MED ORDER — PEG-KCL-NACL-NASULF-NA ASC-C 100 G PO SOLR
0.5000 | Freq: Once | ORAL | Status: AC
  Administered 2018-08-25: 17:00:00 via ORAL
  Filled 2018-08-25 (×2): qty 1

## 2018-08-25 MED ORDER — GADOBUTROL 1 MMOL/ML IV SOLN
10.0000 mL | Freq: Once | INTRAVENOUS | Status: AC | PRN
  Administered 2018-08-25: 10 mL via INTRAVENOUS

## 2018-08-25 MED ORDER — PEG-KCL-NACL-NASULF-NA ASC-C 100 G PO SOLR
0.5000 | Freq: Once | ORAL | Status: AC
  Administered 2018-08-25: 100 g via ORAL

## 2018-08-25 NOTE — Progress Notes (Signed)
Called by primary service for MRI showing subacute right frontal periventricular WM stroke. He is off of antiplatelets due to acute symptomatic anemia. This stroke is likely watershed pattern in the setting of severe anemia. No further w/u at this time. Can follow outpatient neurology once cleared by PCP and GI to discuss resumption of antiplatelets in conjunction with PCP, Cardiology and GI.  -- Amie Portland, MD [facility] Pager: [phone] If 7pm to 7am, please call on call as listed on AMION.

## 2018-08-25 NOTE — Care Management Important Message (Signed)
Important Message  Patient Details  Name: [name] MRN: [id number] Date of Birth: 29-Nov-1950   Medicare Important Message Given:  Yes     Shelda Altes 08/25/2018, 1:58 PM

## 2018-08-25 NOTE — Progress Notes (Signed)
Lattie Haw, MD PGY-1  Dr Criss Rosales called Dr Rory Percy who kindly viewed pt's MRI head result yesterday-many thanks for recs. Advised that this stroke is a likely watershed pattern in the setting of severe anaemia. Advised GI are able to endscopic evaluation if required.   Pt to follow up with Neuro, GI and Cardio as outpatient to discuss antiplatelet therapy.

## 2018-08-25 NOTE — H&P (View-Only) (Signed)
   Patient Name: [name] Date of Encounter: 08/25/2018, 10:30 AM    Subjective  MRI brain suggests watershed stroke related to anemia   Objective  BP 129/60 (BP Location: Right Arm)   Pulse 75   Temp 98.4 F (36.9 C) (Oral)   Resp 18   Ht 6' (1.829 m)   Wt 99.3 kg   SpO2 96%   BMI 29.69 kg/m  Chronically ill NAD CBC Latest Ref Rng & Units 08/25/2018 08/24/2018 08/24/2018  WBC 4.0 - 10.5 K/uL 8.9 7.8 6.0  Hemoglobin 13.0 - 17.0 g/dL 8.6(L) 9.8(L) 8.7(L)  Hematocrit 39.0 - 52.0 % 30.5(L) 34.3(L) 30.5(L)  Platelets 150 - 400 K/uL 167 191 164       Assessment and Plan  Heme + Iron def anemia Abnl Rectum CT - NL DRE  Stroke(s)  Plavix off since admit  Colonoscopy/EGD tomorrow by Dr.Cirigliano  The risks and benefits as well as alternatives of endoscopic procedure(s) have been discussed and reviewed. All questions answered. The patient agrees to proceed.  Giving enemas to help with prep. Have written extra order to notify MD if prep problems Could need NG tube  Gatha Mayer, MD, [facility] 08/25/2018 10:30 AM Pager [phone]

## 2018-08-25 NOTE — Progress Notes (Addendum)
Family Medicine Teaching Service Daily Progress Note Intern Pager: [phone]  Patient name: [name] Medical record number: [id number] Date of birth: February 06, 1951 Age: 68 y.o. Gender: male  Primary Care Provider: Charlott Rakes, MD Consultants: GI, neuro Code Status: DNR from previous admission   Pt Overview and Major Events to Date:  [name] is a 68 y.o. male presenting with symptomatic anemia . PMH is significant for COPD, GERD, HTN, HLD, T2DM, Prior stroke causing weakness/hemiparysis of left side.  Assessment and Plan:  Symptomatic anemia 2/2 GI bleed Hb 8.6 today (7/15 9.8, 7/14 7.9, 7.2) On admission Hb 5.9. Stool guiac was positive for blood -continue to monitor Hb and BMP daily -continue to monitor vitals -continue to monitor for rectal bleeding and melena -Started on oral iron yesterday  - continue protonix gtt -continue with GI recommendations: many thanks for recs-CT scan showed thickened rectum and could be a mass, however rectal exam does not show a mass. GI recommended enema and dulcolax as bowels not opened since admission. Will follow up today regarding colonoscopy. Flexi sig is still an option.  -Continue to holding clopidogrel.      Previous stroke/Hemiparesis of left side Patient with prior stroke in 2017 that lead to weakness in his left side. R sided dominance. MRI head on 7/15- Small likely subacute infarction within right frontal periventricular white matter. No findings of acute stroke. Chronic right superior frontoparietal infarction, moderate chronic microvascular ischemic changes of white matter, and volume loss of the brain. Neuro contacted-many thanks for recs-stroke is likely watershed pattern in the setting of severe anemia. No further w/u at this time. Can follow outpatient neurology once cleared by PCP and GI to discuss resumption of antiplatelets in conjunction with PCP, Cardiology and GI. Right sided weakness resolved -hold aspirin  and clopidogrel -continue lipitor  COPD with lung nodule c/f malignancy sats 95% on room air, RR 17  CT chest abdomen pelvis: Right upper lobe 1.9 by 1.5 cm pulmonary nodule, stable from 03/28/2018 but new compared to 12/12/2012. A slow growing neoplasm/malignancy is a distinct possibility. The lesion has lobular margins with some minimal spiculation. Although metastatic lesion is not excluded, lung primary is probably more likely abnormal wall thickening in the rectum, and a rectal mass with metastatic disease is likewise not excluded. -Monitor respiratory status   -Continue Dulera -Outpatient PET scan and follow up with Oncology  Constipation Bowels not opened since admission Given enema and dulcolax by GI -monitor stools  HTN BP today 106/52 Normotensive on admission. Home medication - losartan - Continue to hold home Losartan - Monitor blood pressures  Chronic Diastolic CHF-stable Previous echo in Feb 8127 - normal systolic fx with 51-70% ejection fraction with impaired relaxation.  No peripheral edema or crackles on exam Not on any medications -Monitor fluid status  CAD, PVD-stable With remote h/o central burning chest pain a few weeks ago, self resolved, no longer has pain. Atypical for ACS. EKG reviewed without ST changes.  -no current indication for cardiac w/u -monitor, continue home lipitor. Restart losartan as BP tolerates.  T2DM - diet controlled Last A1C 5.7 in February 2020, 6.2 in 2018 CBG today 117 - Monitor with BMP  Dementia Patient on Namenda at home. Per chart review also has dementia-related depression, takes cymbalta - Continue home meds  HLD Home medications include lipitor  - Continue lipitor  GERD: Home medications include protonix -Continue protonix gtt  Hx of seizure disorder: on keppra at home. Unknown last seizure. Continue home  keppra  Hx of polysubstance use Prior h/o extensive tobacco use (2-3 packs of cigars per day for 60  years). Prior alcohol use, last drink February 2020. Last had crack cocaine in February 2020. - monitor symptoms  Hx of DVT Very limited mobility. No signs of DVT on exam on admission.  Wearing SCDs -monitor for DVT   FEN/GI: NPO, sips with meds PPx: SCDs  Disposition: pending resolution of medical condition   Subjective:  Pt feels well. Denies chest pain, dyspnea, nausea or vomiting. Reports right sided arm pain today where there is bruising. Also reports lower abdo pain. No new concerns.    Objective: Temp:  [97.5 F (36.4 C)-98.7 F (37.1 C)] 97.9 F (36.6 C) (07/16 0343) Pulse Rate:  [62-92] 83 (07/16 0343) Resp:  [17-20] 17 (07/16 0343) BP: (106-148)/(52-64) 106/52 (07/16 0343) SpO2:  [94 %-98 %] 95 % (07/16 0343)  General: sleepy but able to rouse patient, cooperative and appears to be in no acute distress HEENT: Neck non-tender without lymphadenopathy, masses or thyromegaly, prominent adams apple Cardio: Normal S1 and S2, no S3 or S4. Rhythm is regular No murmurs or rubs.   Pulm: Clear to auscultation bilaterally, no crackles, wheezing, or diminished breath sounds. Normal respiratory effort Abdomen: increased BMI, abdomen distended, tender lower abdomen, no guarding. bowel sounds normal.  Extremities: pt wearing SCDs. Warm/ well perfused.  Strong radial pulse. Extensive bruising on right upper extremity which is tender to touch. Neuro: Cranial nerves grossly intact. ANO X 3  Laboratory: Recent Labs  Lab 08/24/18 0447 08/24/18 1514 08/25/18 0442  WBC 6.0 7.8 8.9  HGB 8.7* 9.8* 8.6*  HCT 30.5* 34.3* 30.5*  PLT 164 191 167   Recent Labs  Lab 08/22/18 1430  08/23/18 0513 08/24/18 0447 08/25/18 0442  NA 144   < > 147* 146* 144  K 3.2*   < > 3.2* 3.5 3.5  CL 113*   < > 114* 115* 113*  CO2 22  --  25 24 23   BUN 8   < > <5* 5* 8  CREATININE 0.84   < > 0.64 0.55* 0.67  CALCIUM 9.4  --  9.3 9.2 9.2  PROT 5.9*  --   --   --   --   BILITOT 0.8  --   --   --    --   ALKPHOS 67  --   --   --   --   ALT 11  --   --   --   --   AST 15  --   --   --   --   GLUCOSE 189*   < > 114* 102* 117*   < > = values in this interval not displayed.    Ct Chest W Contrast  Result Date: 08/24/2018 CLINICAL DATA:  Lung nodule.  Iron deficiency anemia. EXAM: CT CHEST, ABDOMEN, AND PELVIS WITH CONTRAST TECHNIQUE: Multidetector CT imaging of the chest, abdomen and pelvis was performed following the standard protocol during bolus administration of intravenous contrast. CONTRAST:  170mL OMNIPAQUE IOHEXOL 300 MG/ML  SOLN COMPARISON:  Multiple exams, including CT chest from 03/28/2018 and CT scan from 12/12/2012 FINDINGS: CT CHEST FINDINGS Cardiovascular: Coronary, aortic arch, and branch vessel atherosclerotic vascular disease. Mild cardiomegaly. Small pericardial effusion. Mediastinum/Nodes: Right paratracheal node 0.9 cm on image 15/3, formerly 0.7 cm. Lower right paratracheal node 1.0 cm on image 1/3, formerly 0.9 cm. A lower paratracheal node adjacent to the right mainstem bronchus measures 0.9 cm short  axis on image 25/3, stable. Lungs/Pleura: Right upper lobe noncalcified pulmonary nodule measures 1.9 by 1.5 cm on image 66/4. This is stable compared to 03/28/2018 but back on 12/12/2012 there is no readily appreciable nodule in this vicinity. There is a separate calcified granuloma in the right upper lobe. Bandlike scarring or atelectasis in both lower lobes dependently. Trace left pleural effusion. Musculoskeletal: Mild thoracic spondylosis. CT ABDOMEN PELVIS FINDINGS Hepatobiliary: Cholecystectomy.  Otherwise unremarkable. Pancreas: Unremarkable Spleen: Unremarkable Adrenals/Urinary Tract: 0.9 by 1.4 cm nodule of the apex of the left adrenal gland, not changed from 12/12/2012 hence considered benign. A 0.8 cm hypodense exophytic lesion of left mid kidney posteriorly on image 72/3 is probably a cyst but technically too small to characterize. Vascular calcifications are noted at the  renal hila. Excreted contrast medium is present in the urinary bladder, likely from yesterday's CT angiography. Stomach/Bowel: There is evidence of a prior fundoplication at the hiatus. Diffuse wall thickening is present in the rectum with single wall thickness approximately 10 mm on image 118/3. Vascular/Lymphatic: Aortoiliac atherosclerotic vascular disease. Aortobifemoral stent graft. This appears patent. By femoral graft with a midline stent, this by femoral graft appears occluded. Probably occluded femoral to distal grafts. Reproductive: Speckled calcifications in the prostate gland. Other: No supplemental non-categorized findings. Musculoskeletal: Mild lumbar spondylosis and degenerative disc disease. IMPRESSION: 1. Right upper lobe 1.9 by 1.5 cm pulmonary nodule, stable from 03/28/2018 but new compared to 12/12/2012. A slow growing neoplasm/malignancy is a distinct possibility. The lesion has lobular margins with some minimal spiculation. Although metastatic lesion is not excluded, lung primary is probably more likely. 2. That said, there is abnormal wall thickening in the rectum, and a rectal mass with metastatic disease is likewise not excluded. 3. Other imaging findings of potential clinical significance: Aortic Atherosclerosis (ICD10-I70.0). Coronary atherosclerosis. Mild cardiomegaly. Small pericardial effusion. Upper normal sized right paratracheal lymph nodes. Chronically stable small left adrenal nodule, likely benign. Prior fundoplication. Aortobifemoral stent graft is patent. By femoral graft in femoral to distal grafts are likely occluded. Electronically Signed   By: Van Clines M.D.   On: 08/24/2018 09:05   [name] HY Contrast  Result Date: 08/25/2018 CLINICAL DATA:  68 y/o M; transient right-sided weakness. Slurred speech. Altered level of consciousness. EXAM: MRI HEAD WITHOUT AND WITH CONTRAST TECHNIQUE: Multiplanar, multiecho pulse sequences of the brain and surrounding structures  were obtained without and with intravenous contrast. CONTRAST:  10 cc Gadavist COMPARISON:  08/22/2018 CT head, CTA head, CT perfusion head. FINDINGS: Brain: Chronic right superior frontal parietal infarction. Diffuse susceptibility hypointensity throughout vascular structures compatible with systemic iron therapy. No mass effect, hydrocephalus, or herniation. Suboptimal assessment for subtle extra-axial hemorrhage due to iron therapy, no visible hemorrhage on recent CT. 2.4 cm length linear focus of faintly reduced diffusion and enhancement within right frontal periventricular white matter (series 20, image 17 and series 6, image 32) without mass effect, likely subacute infarction. No additional focus of abnormal intracranial enhancement. Background of moderate chronic microvascular ischemic changes and volume loss of the brain. Ex vacuo dilatation of the right lateral ventricle. Wallerian degeneration of cortical spinal tracts extending into the right hemi pons. Vascular: Normal flow voids. Skull and upper cervical spine: Normal marrow signal. Sinuses/Orbits: Bilateral intra-ocular lens replacement. Partial opacification of mastoid air cells and mild mucosal thickening of the left maxillary sinus. Additional paranasal sinuses demonstrate normal signal. Other: None. IMPRESSION: 1. Small likely subacute infarction within right frontal periventricular white matter. No findings of acute stroke.  2. Chronic right superior frontoparietal infarction, moderate chronic microvascular ischemic changes of white matter, and volume loss of the brain. Electronically Signed   By: Kristine Garbe M.D.   On: 08/25/2018 01:53   Ct Abdomen Pelvis W Contrast  Result Date: 08/24/2018 CLINICAL DATA:  Lung nodule.  Iron deficiency anemia. EXAM: CT CHEST, ABDOMEN, AND PELVIS WITH CONTRAST TECHNIQUE: Multidetector CT imaging of the chest, abdomen and pelvis was performed following the standard protocol during bolus  administration of intravenous contrast. CONTRAST:  174mL OMNIPAQUE IOHEXOL 300 MG/ML  SOLN COMPARISON:  Multiple exams, including CT chest from 03/28/2018 and CT scan from 12/12/2012 FINDINGS: CT CHEST FINDINGS Cardiovascular: Coronary, aortic arch, and branch vessel atherosclerotic vascular disease. Mild cardiomegaly. Small pericardial effusion. Mediastinum/Nodes: Right paratracheal node 0.9 cm on image 15/3, formerly 0.7 cm. Lower right paratracheal node 1.0 cm on image 1/3, formerly 0.9 cm. A lower paratracheal node adjacent to the right mainstem bronchus measures 0.9 cm short axis on image 25/3, stable. Lungs/Pleura: Right upper lobe noncalcified pulmonary nodule measures 1.9 by 1.5 cm on image 66/4. This is stable compared to 03/28/2018 but back on 12/12/2012 there is no readily appreciable nodule in this vicinity. There is a separate calcified granuloma in the right upper lobe. Bandlike scarring or atelectasis in both lower lobes dependently. Trace left pleural effusion. Musculoskeletal: Mild thoracic spondylosis. CT ABDOMEN PELVIS FINDINGS Hepatobiliary: Cholecystectomy.  Otherwise unremarkable. Pancreas: Unremarkable Spleen: Unremarkable Adrenals/Urinary Tract: 0.9 by 1.4 cm nodule of the apex of the left adrenal gland, not changed from 12/12/2012 hence considered benign. A 0.8 cm hypodense exophytic lesion of left mid kidney posteriorly on image 72/3 is probably a cyst but technically too small to characterize. Vascular calcifications are noted at the renal hila. Excreted contrast medium is present in the urinary bladder, likely from yesterday's CT angiography. Stomach/Bowel: There is evidence of a prior fundoplication at the hiatus. Diffuse wall thickening is present in the rectum with single wall thickness approximately 10 mm on image 118/3. Vascular/Lymphatic: Aortoiliac atherosclerotic vascular disease. Aortobifemoral stent graft. This appears patent. By femoral graft with a midline stent, this by  femoral graft appears occluded. Probably occluded femoral to distal grafts. Reproductive: Speckled calcifications in the prostate gland. Other: No supplemental non-categorized findings. Musculoskeletal: Mild lumbar spondylosis and degenerative disc disease. IMPRESSION: 1. Right upper lobe 1.9 by 1.5 cm pulmonary nodule, stable from 03/28/2018 but new compared to 12/12/2012. A slow growing neoplasm/malignancy is a distinct possibility. The lesion has lobular margins with some minimal spiculation. Although metastatic lesion is not excluded, lung primary is probably more likely. 2. That said, there is abnormal wall thickening in the rectum, and a rectal mass with metastatic disease is likewise not excluded. 3. Other imaging findings of potential clinical significance: Aortic Atherosclerosis (ICD10-I70.0). Coronary atherosclerosis. Mild cardiomegaly. Small pericardial effusion. Upper normal sized right paratracheal lymph nodes. Chronically stable small left adrenal nodule, likely benign. Prior fundoplication. Aortobifemoral stent graft is patent. By femoral graft in femoral to distal grafts are likely occluded. Electronically Signed   By: Van Clines M.D.   On: 08/24/2018 09:05   Lattie Haw, MD 08/25/2018, 7:31 AM PGY-1, [facility] Intern pager: [phone], text pages welcome

## 2018-08-25 NOTE — Progress Notes (Signed)
Will round later but plan for colonoscopy/EGD tomorrow. Orders written  Gatha Mayer, MD, [facility] 08/25/2018 9:43 AM Pager [phone]

## 2018-08-25 NOTE — Progress Notes (Signed)
   Patient Name: [name] Date of Encounter: 08/25/2018, 10:30 AM    Subjective  MRI brain suggests watershed stroke related to anemia   Objective  BP 129/60 (BP Location: Right Arm)   Pulse 75   Temp 98.4 F (36.9 C) (Oral)   Resp 18   Ht 6' (1.829 m)   Wt 99.3 kg   SpO2 96%   BMI 29.69 kg/m  Chronically ill NAD CBC Latest Ref Rng & Units 08/25/2018 08/24/2018 08/24/2018  WBC 4.0 - 10.5 K/uL 8.9 7.8 6.0  Hemoglobin 13.0 - 17.0 g/dL 8.6(L) 9.8(L) 8.7(L)  Hematocrit 39.0 - 52.0 % 30.5(L) 34.3(L) 30.5(L)  Platelets 150 - 400 K/uL 167 191 164       Assessment and Plan  Heme + Iron def anemia Abnl Rectum CT - NL DRE  Stroke(s)  Plavix off since admit  Colonoscopy/EGD tomorrow by Dr.Cirigliano  The risks and benefits as well as alternatives of endoscopic procedure(s) have been discussed and reviewed. All questions answered. The patient agrees to proceed.  Giving enemas to help with prep. Have written extra order to notify MD if prep problems Could need NG tube  Gatha Mayer, MD, [facility] 08/25/2018 10:30 AM Pager [phone]

## 2018-08-26 ENCOUNTER — Encounter (HOSPITAL_COMMUNITY)
Admission: EM | Disposition: A | Discharge status: Skilled Nursing Facility | Payer: Self-pay | Source: Skilled Nursing Facility | Attending: Family Medicine

## 2018-08-26 ENCOUNTER — Inpatient Hospital Stay (HOSPITAL_COMMUNITY): Payer: Medicare Other | Admitting: Anesthesiology

## 2018-08-26 ENCOUNTER — Telehealth: Payer: Self-pay | Admitting: Gastroenterology

## 2018-08-26 ENCOUNTER — Encounter (HOSPITAL_COMMUNITY): Payer: Self-pay | Admitting: *Deleted

## 2018-08-26 DIAGNOSIS — K621 Rectal polyp: Secondary | ICD-10-CM

## 2018-08-26 DIAGNOSIS — I959 Hypotension, unspecified: Secondary | ICD-10-CM | POA: Diagnosis not present

## 2018-08-26 DIAGNOSIS — K2950 Unspecified chronic gastritis without bleeding: Secondary | ICD-10-CM | POA: Diagnosis not present

## 2018-08-26 DIAGNOSIS — G479 Sleep disorder, unspecified: Secondary | ICD-10-CM | POA: Diagnosis not present

## 2018-08-26 DIAGNOSIS — I6930 Unspecified sequelae of cerebral infarction: Secondary | ICD-10-CM | POA: Diagnosis not present

## 2018-08-26 DIAGNOSIS — I5033 Acute on chronic diastolic (congestive) heart failure: Secondary | ICD-10-CM | POA: Diagnosis not present

## 2018-08-26 DIAGNOSIS — K922 Gastrointestinal hemorrhage, unspecified: Secondary | ICD-10-CM | POA: Diagnosis not present

## 2018-08-26 DIAGNOSIS — K31819 Angiodysplasia of stomach and duodenum without bleeding: Secondary | ICD-10-CM | POA: Diagnosis not present

## 2018-08-26 DIAGNOSIS — J449 Chronic obstructive pulmonary disease, unspecified: Secondary | ICD-10-CM | POA: Diagnosis not present

## 2018-08-26 DIAGNOSIS — L299 Pruritus, unspecified: Secondary | ICD-10-CM | POA: Diagnosis not present

## 2018-08-26 DIAGNOSIS — R509 Fever, unspecified: Secondary | ICD-10-CM | POA: Diagnosis not present

## 2018-08-26 DIAGNOSIS — K635 Polyp of colon: Secondary | ICD-10-CM

## 2018-08-26 DIAGNOSIS — I2510 Atherosclerotic heart disease of native coronary artery without angina pectoris: Secondary | ICD-10-CM | POA: Diagnosis not present

## 2018-08-26 DIAGNOSIS — I69354 Hemiplegia and hemiparesis following cerebral infarction affecting left non-dominant side: Secondary | ICD-10-CM | POA: Diagnosis not present

## 2018-08-26 DIAGNOSIS — K2970 Gastritis, unspecified, without bleeding: Secondary | ICD-10-CM | POA: Diagnosis not present

## 2018-08-26 DIAGNOSIS — M2550 Pain in unspecified joint: Secondary | ICD-10-CM | POA: Diagnosis not present

## 2018-08-26 DIAGNOSIS — G2581 Restless legs syndrome: Secondary | ICD-10-CM | POA: Diagnosis not present

## 2018-08-26 DIAGNOSIS — D500 Iron deficiency anemia secondary to blood loss (chronic): Secondary | ICD-10-CM | POA: Diagnosis not present

## 2018-08-26 DIAGNOSIS — K31811 Angiodysplasia of stomach and duodenum with bleeding: Secondary | ICD-10-CM | POA: Diagnosis not present

## 2018-08-26 DIAGNOSIS — I639 Cerebral infarction, unspecified: Secondary | ICD-10-CM | POA: Diagnosis not present

## 2018-08-26 DIAGNOSIS — K5900 Constipation, unspecified: Secondary | ICD-10-CM | POA: Diagnosis not present

## 2018-08-26 DIAGNOSIS — R471 Dysarthria and anarthria: Secondary | ICD-10-CM | POA: Diagnosis not present

## 2018-08-26 DIAGNOSIS — G8191 Hemiplegia, unspecified affecting right dominant side: Secondary | ICD-10-CM | POA: Diagnosis not present

## 2018-08-26 DIAGNOSIS — I6389 Other cerebral infarction: Secondary | ICD-10-CM | POA: Diagnosis not present

## 2018-08-26 DIAGNOSIS — Z7401 Bed confinement status: Secondary | ICD-10-CM | POA: Diagnosis not present

## 2018-08-26 DIAGNOSIS — E785 Hyperlipidemia, unspecified: Secondary | ICD-10-CM | POA: Diagnosis not present

## 2018-08-26 DIAGNOSIS — G4701 Insomnia due to medical condition: Secondary | ICD-10-CM | POA: Diagnosis not present

## 2018-08-26 DIAGNOSIS — I10 Essential (primary) hypertension: Secondary | ICD-10-CM | POA: Diagnosis not present

## 2018-08-26 DIAGNOSIS — R262 Difficulty in walking, not elsewhere classified: Secondary | ICD-10-CM | POA: Diagnosis not present

## 2018-08-26 DIAGNOSIS — K219 Gastro-esophageal reflux disease without esophagitis: Secondary | ICD-10-CM | POA: Diagnosis not present

## 2018-08-26 DIAGNOSIS — D649 Anemia, unspecified: Secondary | ICD-10-CM | POA: Diagnosis not present

## 2018-08-26 DIAGNOSIS — Z7409 Other reduced mobility: Secondary | ICD-10-CM | POA: Diagnosis not present

## 2018-08-26 DIAGNOSIS — M1000 Idiopathic gout, unspecified site: Secondary | ICD-10-CM | POA: Diagnosis not present

## 2018-08-26 DIAGNOSIS — G8194 Hemiplegia, unspecified affecting left nondominant side: Secondary | ICD-10-CM | POA: Diagnosis not present

## 2018-08-26 DIAGNOSIS — M6281 Muscle weakness (generalized): Secondary | ICD-10-CM | POA: Diagnosis not present

## 2018-08-26 DIAGNOSIS — R911 Solitary pulmonary nodule: Secondary | ICD-10-CM | POA: Diagnosis not present

## 2018-08-26 DIAGNOSIS — R05 Cough: Secondary | ICD-10-CM | POA: Diagnosis not present

## 2018-08-26 DIAGNOSIS — D124 Benign neoplasm of descending colon: Secondary | ICD-10-CM | POA: Diagnosis not present

## 2018-08-26 DIAGNOSIS — R5381 Other malaise: Secondary | ICD-10-CM | POA: Diagnosis not present

## 2018-08-26 DIAGNOSIS — D62 Acute posthemorrhagic anemia: Secondary | ICD-10-CM | POA: Diagnosis not present

## 2018-08-26 DIAGNOSIS — D128 Benign neoplasm of rectum: Secondary | ICD-10-CM | POA: Diagnosis not present

## 2018-08-26 HISTORY — PX: BIOPSY: SHX5522

## 2018-08-26 HISTORY — PX: FLEXIBLE SIGMOIDOSCOPY: SHX5431

## 2018-08-26 HISTORY — PX: POLYPECTOMY: SHX5525

## 2018-08-26 HISTORY — PX: HOT HEMOSTASIS: SHX5433

## 2018-08-26 HISTORY — PX: ESOPHAGOGASTRODUODENOSCOPY (EGD) WITH PROPOFOL: SHX5813

## 2018-08-26 LAB — BASIC METABOLIC PANEL
Anion gap: 7 (ref 5–15)
BUN: <5 mg/dL — ABNORMAL LOW (ref 8–23)
CO2: 27 mmol/L (ref 22–32)
Calcium: 9.4 mg/dL (ref 8.9–10.3)
Chloride: 114 mmol/L — ABNORMAL HIGH (ref 98–111)
Creatinine, Ser: 0.61 mg/dL (ref 0.61–1.24)
GFR calc Af Amer: >60 mL/min (ref >60)
GFR calc non Af Amer: >60 mL/min (ref >60)
Glucose, Bld: 93 mg/dL (ref 70–99)
Potassium: 3.1 mmol/L — ABNORMAL LOW (ref 3.5–5.1)
Sodium: 148 mmol/L — ABNORMAL HIGH (ref 135–145)

## 2018-08-26 LAB — CBC
HCT: 35.5 % — ABNORMAL LOW (ref 39.0–52.0)
Hemoglobin: 9.9 g/dL — ABNORMAL LOW (ref 13.0–17.0)
MCH: 22.6 pg — ABNORMAL LOW (ref 26.0–34.0)
MCHC: 27.9 g/dL — ABNORMAL LOW (ref 30.0–36.0)
MCV: 81.1 fL (ref 80.0–100.0)
Platelets: 181 10*3/uL (ref 150–400)
RBC: 4.38 MIL/uL (ref 4.22–5.81)
RDW: 20.4 % — ABNORMAL HIGH (ref 11.5–15.5)
WBC: 9.3 10*3/uL (ref 4.0–10.5)
nRBC: 0.2 % (ref 0.0–0.2)

## 2018-08-26 LAB — SARS CORONAVIRUS 2 BY RT PCR (HOSPITAL ORDER, PERFORMED IN ~~LOC~~ HOSPITAL LAB): SARS Coronavirus 2: NEGATIVE

## 2018-08-26 SURGERY — ESOPHAGOGASTRODUODENOSCOPY (EGD) WITH PROPOFOL
Anesthesia: Monitor Anesthesia Care

## 2018-08-26 MED ORDER — PROPOFOL 10 MG/ML IV BOLUS
INTRAVENOUS | Status: DC | PRN
  Administered 2018-08-26: 10 mg via INTRAVENOUS
  Administered 2018-08-26 (×2): 20 mg via INTRAVENOUS

## 2018-08-26 MED ORDER — LACTATED RINGERS IV SOLN
INTRAVENOUS | Status: AC | PRN
  Administered 2018-08-26: 1000 mL via INTRAVENOUS

## 2018-08-26 MED ORDER — FERROUS SULFATE 325 (65 FE) MG PO TABS: 325.0000 mg | ORAL_TABLET | Freq: Every day | ORAL | 3 refills | Status: DC

## 2018-08-26 MED ORDER — FLEET ENEMA 7-19 GM/118ML RE ENEM
2.0000 | ENEMA | Freq: Once | RECTAL | Status: AC
  Administered 2018-08-26: 08:00:00 2 via RECTAL
  Filled 2018-08-26: qty 2

## 2018-08-26 MED ORDER — DEXMEDETOMIDINE HCL 200 MCG/2ML IV SOLN
INTRAVENOUS | Status: DC | PRN
  Administered 2018-08-26: 4 ug via INTRAVENOUS

## 2018-08-26 MED ORDER — POTASSIUM CHLORIDE CRYS ER 20 MEQ PO TBCR
40.0000 meq | EXTENDED_RELEASE_TABLET | ORAL | Status: AC
  Administered 2018-08-26 (×2): 40 meq via ORAL
  Filled 2018-08-26 (×2): qty 2

## 2018-08-26 MED ORDER — PHENYLEPHRINE HCL (PRESSORS) 10 MG/ML IV SOLN
INTRAVENOUS | Status: DC | PRN
  Administered 2018-08-26 (×2): 40 ug via INTRAVENOUS

## 2018-08-26 MED ORDER — ONDANSETRON HCL 4 MG/2ML IJ SOLN
INTRAMUSCULAR | Status: DC | PRN
  Administered 2018-08-26: 4 mg via INTRAVENOUS

## 2018-08-26 MED ORDER — LIDOCAINE HCL (CARDIAC) PF 100 MG/5ML IV SOSY
PREFILLED_SYRINGE | INTRAVENOUS | Status: DC | PRN
  Administered 2018-08-26: 100 mg via INTRATRACHEAL

## 2018-08-26 MED ORDER — SODIUM CHLORIDE 0.9 % IV SOLN
INTRAVENOUS | Status: DC | PRN
  Administered 2018-08-26: 10:00:00 via INTRAVENOUS

## 2018-08-26 MED ORDER — PROPOFOL 500 MG/50ML IV EMUL
INTRAVENOUS | Status: DC | PRN
  Administered 2018-08-26: 50 ug/kg/min via INTRAVENOUS

## 2018-08-26 SURGICAL SUPPLY — 25 items

## 2018-08-26 NOTE — Anesthesia Postprocedure Evaluation (Signed)
Anesthesia Post Note  Patient: VIRL COBLE  Procedure(s) Performed: ESOPHAGOGASTRODUODENOSCOPY (EGD) WITH PROPOFOL (N/A ) FLEXIBLE SIGMOIDOSCOPY (N/A ) BIOPSY HOT HEMOSTASIS (ARGON PLASMA COAGULATION/BICAP) (N/A ) POLYPECTOMY     Patient location during evaluation: Endoscopy Anesthesia Type: MAC Level of consciousness: awake and alert Pain management: pain level controlled Vital Signs Assessment: post-procedure vital signs reviewed and stable Respiratory status: spontaneous breathing, nonlabored ventilation, respiratory function stable and patient connected to nasal cannula oxygen Cardiovascular status: blood pressure returned to baseline and stable Postop Assessment: no apparent nausea or vomiting Anesthetic complications: no    Last Vitals:  Vitals:   08/26/18 1055 08/26/18 1119  BP: (!) 120/56 (!) 124/55  Pulse: 95 86  Resp: (!) 22 (!) 22  Temp:  36.6 C  SpO2: 98% 94%    Last Pain:  Vitals:   08/26/18 1119  TempSrc: Oral  PainSc:                  Chelsey L Woodrum

## 2018-08-26 NOTE — Interval H&P Note (Signed)
History and Physical Interval Note:  08/26/2018 9:54 AM  [name]  has presented today for surgery, with the diagnosis of iron deficiency anemia and heme +.  The various methods of treatment have been discussed with the patient and family. After consideration of risks, benefits and other options for treatment, the patient has consented to  Procedure(s): FLEXIBLE SIGMOIDOSCOPY WITH PROPOFOL (N/A) ESOPHAGOGASTRODUODENOSCOPY (EGD) WITH PROPOFOL (N/A) as a surgical intervention.  The patient's history has been reviewed, patient examined, no change in status, stable for surgery.  I have reviewed the patient's chart and labs.  Questions were answered to the patient's satisfaction.     Dominic Pea [name]

## 2018-08-26 NOTE — Progress Notes (Signed)
Lattie Haw, MD PGY-1  Confirmed that pt is DNR

## 2018-08-26 NOTE — Progress Notes (Signed)
Pt picked up by [facility] during shift change; pt transported off unit via stretcher with belongings to the side. Delia Heady RN

## 2018-08-26 NOTE — TOC Transition Note (Signed)
Transition of Care [facility]) - CM/SW Discharge Note   Patient Details  Name: [name] MRN: [id number] Date of Birth: December 18, 1950  [facility] CM/SW Contact:  Pollie Friar, RN Phone Number: 08/26/2018, 4:09 PM   Clinical Narrative:    Pt is discharging back to [facility] today. Pt to transport via [facility]. D/c packet at the desk and bedside RN aware.  TOC left message for friend [name] per pt request.  Number for report: [phone] Pt room #: 116   Final next level of care: Skilled Nursing Facility Barriers to Discharge: No Barriers Identified   Patient Goals and CMS Choice   CMS Medicare.gov Compare Post Acute Care list provided to:: Patient Choice offered to / list presented to : Patient  Discharge Placement              Patient chooses bed at: [facility] Patient to be transferred to facility by: [facility] Name of family member notified: left message for his friend [name]--pt is oriented and aware Patient and family notified of of transfer: 08/26/18  Discharge Plan and Services In-house Referral: Clinical Social Work Discharge Planning Services: CM Consult                                 Social Determinants of Health ([place]) Interventions     Readmission Risk Interventions Readmission Risk Prevention Plan 08/23/2018  Transportation Screening Complete  PCP or Specialist Appt within 3-5 Days Complete  HRI or [place] Not Complete  Palliative Care Screening Complete  Medication Review (RN Care Manager) Referral to Pharmacy  Some recent data might be hidden

## 2018-08-26 NOTE — Progress Notes (Signed)
This nurse called 3 times to give report to facility, as pt will be leaving soon to return to SNF.  Last call, this nurse was on hold for several minutes then someone returned to say the nurse said she is too busy to take report because she is busy on the cart and she is passing meds and taking care of patients.  This nurse thanked the lady who passed the message and  reminded them that I called 3 times, starting 2 hours previously so they could get report and this nurse was about to go home and would pass the report to the next shift.  She said ok.  This nurse told her she did not want transportation to get here and pick up the patient and the patient get there without the nurse first receiving report.  She said she would pass that message.

## 2018-08-26 NOTE — Op Note (Signed)
[facility] Patient Name: [name] Procedure Date : 08/26/2018 MRN: [id number] Attending MD: Gerrit Heck , MD Date of Birth: 06-28-1950 CSN: [id number] Age: 68 Admit Type: Inpatient Procedure:                Upper GI endoscopy Indications:              Iron deficiency anemia, Heme positive stool,                            Abnormal CT of the GI tract                           68 yo male admitted with symptomatic IDA, with                            admission Hgb 5.9, transfused with 2U pRBCs                            initially, then requiring an additional 2U pRBCs on                            this admission, with Hb 9.9 today. Ferritin 8, iron                            114, iron sat 29% with normal B12/folate. FOBT+                            stools on admission. Baseline Hgb approximately 10.                            Admission CT notable for pulmonary lesion as well                            as rectal wall thickening. Additionally with                            subacute CVA in a watershed distribution suspected                            2/2 anemia. Providers:                Gerrit Heck, MD, Carmie End, RN, [name], Technician, Haze Boyden, CRNA Referring MD:              Medicines:                Monitored Anesthesia Care Complications:            No immediate complications. Estimated Blood Loss:     Estimated blood loss was minimal. Procedure:                Pre-Anesthesia Assessment:                           -  Prior to the procedure, a History and Physical                            was performed, and patient medications and                            allergies were reviewed. The patient's tolerance of                            previous anesthesia was also reviewed. The risks                            and benefits of the procedure and the sedation                            options and risks were  discussed with the patient.                            All questions were answered, and informed consent                            was obtained. Prior Anticoagulants: The patient has                            taken Plavix (clopidogrel), last dose was 8 days                            prior to procedure. ASA Grade Assessment: IV - A                            patient with severe systemic disease that is a                            constant threat to life. After reviewing the risks                            and benefits, the patient was deemed in                            satisfactory condition to undergo the procedure.                           After obtaining informed consent, the endoscope was                            passed under direct vision. Throughout the                            procedure, the patient's blood pressure, pulse, and                            oxygen saturations were monitored continuously. The  GIF-H190 ([id number]) Olympus gastroscope was                            introduced through the mouth, and advanced to the                            second part of duodenum. The upper GI endoscopy was                            accomplished without difficulty. The patient                            tolerated the procedure well. Scope In: Scope Out: Findings:      The examined esophagus was normal.      Evidence of a Nissen fundoplication was found in the cardia. The wrap       appeared intact. This was traversed.      A single 4 mm angioectasia with no bleeding was found in the gastric       body. Coagulation for hemostasis using argon plasma at 1 liter/minute       and 20 watts was successful. Estimated blood loss: none.      Mild inflammation characterized by congestion (edema) and erythema was       found in the gastric antrum. Biopsies were taken with a cold forceps for       Helicobacter pylori testing. Estimated blood loss was minimal.       Normal mucosa was found in the gastric fundus, in the gastric body and       at the incisura. Biopsies were taken with a cold forceps for       Helicobacter pylori testing. Estimated blood loss was minimal.      The duodenal bulb, first portion of the duodenum and second portion of       the duodenum were normal. Biopsies for histology were taken with a cold       forceps for evaluation of celiac disease. Estimated blood loss was       minimal. Impression:               - Normal esophagus.                           - A Nissen fundoplication was found. The wrap                            appears intact.                           - A single non-bleeding angioectasia in the                            stomach. Treated with argon plasma coagulation                            (APC).                           - Gastritis. Biopsied.                           -  Normal mucosa was found in the gastric fundus, in                            the gastric body and in the incisura. Biopsied.                           - Normal duodenal bulb, first portion of the                            duodenum and second portion of the duodenum.                            Biopsied. Recommendation:           - Return patient to hospital ward for ongoing care.                           - Clear liquid diet today.                           - Await pathology results.                           - Proceed with Flexible Sigmoidoscopy today (was                            unable to tolerate bowel prep to proceed with                            colonoscopy as was originally planned). Procedure Code(s):        --- Professional ---                           36468, 59, Esophagogastroduodenoscopy, flexible,                            transoral; with control of bleeding, any method                           43239, Esophagogastroduodenoscopy, flexible,                            transoral; with biopsy, single or multiple Diagnosis  Code(s):        --- Professional ---                           E32.122, Other specified postprocedural states                           K31.819, Angiodysplasia of stomach and duodenum                            without bleeding                           K29.70, Gastritis, unspecified, without bleeding  D50.9, Iron deficiency anemia, unspecified                           R19.5, Other fecal abnormalities                           R93.3, Abnormal findings on diagnostic imaging of                            other parts of digestive tract CPT copyright 2019 [employer]. All rights reserved. The codes documented in this report are preliminary and upon coder review may  be revised to meet current compliance requirements. Gerrit Heck, MD 08/26/2018 10:48:11 AM Number of Addenda: 0

## 2018-08-26 NOTE — Progress Notes (Addendum)
Kenneth Haw, MD PGY-1  Telephone call to [name]'s emergency contact, previous health aide. I explained that [name] is ready for discharge- we have treated his symptomatic anaemia in hospital and he has had an EGD and sigmoidoscopy. He will require a further colonoscopy on d/c. I explained that [name] would like to be discharged back to his nursing facility and have the colonoscopy as an outpatient. [name] is happy with this plan. I discussed options re his suspicious lung nodule and explained that [name] prognosis and response to chemo may be poor if it was found to be malignant. I asked whether [name] thought it was appropriate to investigate this further. [name] said [name] would like the nodule investigated as an outpatient and to contact her after the results of the PET scan.   I clarified that [name] would like [name] to be DNR. [name] will also be his official healthcare power of attorney soon after the paperwork has been signed.

## 2018-08-26 NOTE — Telephone Encounter (Signed)
Dr. Bryan Lemma has already responded for this patient as RN called and spoke with Alleen Borne, RN in endoscopy

## 2018-08-26 NOTE — Progress Notes (Signed)
The chaplain followed up with the Pt. RN-Erika. The RN stated the Pt. is back in his room and a little drowsy. The chaplain will F/U with spiritual care for the Pt. AD as needed.

## 2018-08-26 NOTE — Anesthesia Procedure Notes (Signed)
Procedure Name: MAC Date/Time: 08/26/2018 10:04 AM Performed by: Kathryne Hitch, CRNA Pre-anesthesia Checklist: Patient identified, Emergency Drugs available, Suction available and Patient being monitored Patient Re-evaluated:Patient Re-evaluated prior to induction Oxygen Delivery Method: Nasal cannula Preoxygenation: Pre-oxygenation with 100% oxygen Induction Type: IV induction Dental Injury: Teeth and Oropharynx as per pre-operative assessment

## 2018-08-26 NOTE — Op Note (Signed)
[facility] Patient Name: [name] Procedure Date : 08/26/2018 MRN: [id number] Attending MD: Gerrit Heck , MD Date of Birth: 1950-11-11 CSN: [id number] Age: 68 Admit Type: Inpatient Procedure:                Flexible Sigmoidoscopy Indications:              Gastrointestinal occult blood loss, Abnormal CT of                            the GI tract, Iron deficiency anemia                           68 yo male admitted with symptomatic IDA, with                            admission Hgb 5.9, transfused with 2U pRBCs                            initially, then requiring an additional 2U pRBCs on                            this admission, with Hb 9.9 today. Ferritin 8, iron                            114, iron sat 29% with normal B12/folate. FOBT+                            stools on admission. Baseline Hgb approximately 10.                            Admission CT notable for pulmonary lesion as well                            as rectal wall thickening. Additionally with                            subacute CVA in a watershed distribution suspected                            2/2 anemia. Providers:                Gerrit Heck, MD, Carmie End, RN, [name], Technician, Haze Boyden, CRNA Referring MD:              Medicines:                Monitored Anesthesia Care Complications:            No immediate complications. Estimated Blood Loss:     Estimated blood loss was minimal. Procedure:                Pre-Anesthesia Assessment:                           -  Prior to the procedure, a History and Physical                            was performed, and patient medications and                            allergies were reviewed. The patient's tolerance of                            previous anesthesia was also reviewed. The risks                            and benefits of the procedure and the sedation   options and risks were discussed with the patient.                            All questions were answered, and informed consent                            was obtained. Prior Anticoagulants: The patient has                            taken Plavix (clopidogrel), last dose was 8 days                            prior to procedure. ASA Grade Assessment: IV - A                            patient with severe systemic disease that is a                            constant threat to life. After reviewing the risks                            and benefits, the patient was deemed in                            satisfactory condition to undergo the procedure.                           After obtaining informed consent, the scope was                            passed under direct vision. The GIF-H190 ([id number])                            Olympus gastroscope was introduced through the anus                            and advanced to the the descending colon. The                            flexible sigmoidoscopy was accomplished without  difficulty. The patient tolerated the procedure                            well. The quality of the bowel preparation was fair. Scope In: 10:22:30 AM Scope Out: 10:36:08 AM Total Procedure Duration: 0 hours 13 minutes 38 seconds  Findings:      The perianal and digital rectal examinations were normal.      Four sessile polyps were found in the rectum and descending colon. The       polyps were 3 to 6 mm in size. These polyps were removed with a cold       snare. Resection and retrieval were complete. Estimated blood loss was       minimal.      Non-bleeding internal hemorrhoids were found during retroflexion. The       hemorrhoids were small.      A moderate amount of semi-liquid stool was found in the sigmoid colon       and in the descending colon, interfering with visualization. Lavage of       the area was performed using copious amounts of  sterile water, resulting       in clearance with fair visualization.      The visualized mucosa was otherwise normal appearing in the rectum, in       the recto-sigmoid colon, in the sigmoid colon and in the descending       colon. Impression:               - Preparation of the colon was fair.                           - Four 3 to 6 mm polyps in the rectum and in the                            descending colon, removed with a cold snare.                            Resected and retrieved.                           - Non-bleeding internal hemorrhoids.                           - Stool in the sigmoid colon and in the descending                            colon.                           - The visualized mucosa was otherwise normal                            appearing in the rectum, in the recto-sigmoid                            colon, in the sigmoid colon and in the descending  colon.                           - Unable to exclude source of bleeding proximal to                            the mid descending colon. Discussed remaining                            inpatient with attempt and repeat bowel preparation                            overnight and colonoscopy tomorrow, but his strong                            preference was to discharge to home with plan for                            ongoing work-up as an outpatient. Discussed the                            upper endoscopy finding of a single medium sized                            gastric AVM, treated with APC. This certainly could                            be the etiology of his IDA from occult GI blood                            loss, and would be reasonable to continue                            surveillance as below. Recommendation:           - Return patient to hospital ward for ongoing care.                           - Clear liquid diet today.                           - Continue present medications.                            - Await pathology results.                           - Perform a colonoscopy at appointment to be                            scheduled.                           - Return to GI clinic at appointment to be  scheduled.                           - Repeat CBC as outpatient in 7-10 days.                           - Repeat iron panel in 2-3 months.                           - Plan for outpatient colonoscopy for polyp                            surveillance and completion of IDA work-up per                            patient preference. If the colonoscopy is otherwise                            unrevealing, and if the IDA persists despite                            today's treatment of the gastric AVM, will plan for                            small bowel interrogation with VCE as an outpatient.                           - Ok to resume antiplatelet therapy tomorrow as                            previously prescribed. Procedure Code(s):        --- Professional ---                           [phone], Sigmoidoscopy, flexible; with removal of                            tumor(s), polyp(s), or other lesion(s) by snare                            technique Diagnosis Code(s):        --- Professional ---                           K64.8, Other hemorrhoids                           K62.1, Rectal polyp                           K63.5, Polyp of colon                           R19.5, Other fecal abnormalities                           R93.3,  Abnormal findings on diagnostic imaging of                            other parts of digestive tract CPT copyright 2019 [employer]. All rights reserved. The codes documented in this report are preliminary and upon coder review may  be revised to meet current compliance requirements. Gerrit Heck, MD 08/26/2018 11:12:42 AM Number of Addenda: 0

## 2018-08-26 NOTE — Progress Notes (Signed)
Called [facility], and spoke to Bertha, to report that last BM was around 0400 this am and that he is not having many stools and that it was reported to day shift nurses that his stools still are not clear.  Pt stated that he does have rumbling in his stomach but he does not really feel like he has to go.  He did take his entire prep as ordered and did receive clear liquid yesterday and has been NPO since Midnight.

## 2018-08-26 NOTE — Progress Notes (Signed)
This chaplain reviewed Pt. chart for best time for spiritual care visit to complete AD update per PMT. The chaplain recognizes the Pt. is not in his room at this time.  The chaplain will F/U with spiritual care.

## 2018-08-26 NOTE — Progress Notes (Signed)
This nurse called back to [facility] to report that pt is now on the way and I still have not given report, as was told the nurse was busy.  I said he is on the way if someone wants to take report.  I was told she would pass the message.  I said I did not want him to just show up without them receiving report and she said she would pass the message to the nurse.  It was reported to my charge nurse [name].

## 2018-08-26 NOTE — Discharge Instructions (Signed)
Mr [name], [name] were admitted for a low blood count making you anemic and feel unwell. We have transfused you with 4 units of blood and your blood count has improved. You are pending some investigations with the GI team-colonoscopy and also a clinic appointment. They will contact you when this will be. You will need to have some blood tests on discharge in 7-10 days and a blood test for iron in 2-3 months. You will have a PET scan for your lungs which will be organised by Oncology. Oncology will also see you on discharge.   Please take your medications on discharge.  Dr Lattie Haw

## 2018-08-26 NOTE — Progress Notes (Signed)
Family Medicine Teaching Service Daily Progress Note Intern Pager: [phone]  Patient name: [name] Medical record number: [id number] Date of birth: [birth year] Age: 68 y.o. Gender: male  Primary Care Provider: Charlott Rakes, MD Consultants: GI, neuro Code Status: DNR from previous admission   Pt Overview and Major Events to Date:  [name] is a 68 y.o. male presenting with symptomatic anemia . PMH is significant for COPD, GERD, HTN, HLD, T2DM, Prior stroke causing weakness/hemiparysis of left side.  Assessment and Plan:  Symptomatic anemia 2/2 GI bleed Hb 9.9 today (7/16 8.6, 7/15 9.8, 7/14 7.9, 7.2) On admission Hb 5.9. Stool guiac was positive for blood -continue to monitor Hb and BMP daily -continue to monitor vitals -continue to monitor for rectal bleeding and melena -Started on oral iron on 7/16 - continue protonix gtt -continue with GI recommendations: many thanks for recs from GI-Dr Gessner-endoscopy and colonoscopy today -Continue to holding and aspirin and clopidogrel-FU with Cardio, GI and Neuro on d/c to discuss plan antiplatelet therapy.     Previous stroke/Hemiparesis of left side-stable Patient with prior stroke in 2017 that lead to weakness in his left side. R sided dominance. MRI head on 7/15- Small likely subacute infarction within right frontal periventricular white matter. No findings of acute stroke. Chronic right superior frontoparietal infarction, moderate chronic microvascular ischemic changes of white matter, and volume loss of the brain. Neuro contacted-many thanks for recs-stroke is likely watershed pattern in the setting of severe anemia. Right sided weakness resolved -hold aspirin and clopidogrel -continue lipitor -Continue to holding and aspirin and clopidogrel-FU with Cardio, GI and Neuro on d/c to discuss plan antiplatelet therapy  COPD with lung nodule c/f malignancy sats 95% on room air, RR 16  CT chest abdomen pelvis: Right  upper lobe 1.9 by 1.5 cm pulmonary nodule, stable from 03/28/2018 but new compared to 12/12/2012. A slow growing neoplasm/malignancy is a distinct possibility. Lesion has lobular margins with some minimal spiculation. Although metastatic lesion is not excluded, lung primary is probably more likely abnormal wall thickening in the rectum, and a rectal mass with metastatic disease is likewise not excluded. -Monitor respiratory status   -Continue Dulera -Outpatient PET scan and follow up with Oncology  Constipation Given enema and dulcolax by GI -monitor stools  HTN-stable  BP today 127/63 Normotensive on admission. Home medication -losartan - Continue to hold home Losartan - Monitor blood pressures  Chronic Diastolic CHF-stable Previous echo in Feb 2595 - normal systolic fx with 63-87% ejection fraction with impaired relaxation.  No peripheral edema or crackles on exam Not on any medications -Monitor fluid status  CAD, PVD-stable With remote h/o central burning chest pain a few weeks ago, self resolved, no longer has pain. Atypical for ACS. EKG reviewed without ST changes.  -no current indication for cardiac w/u -monitor, continue home lipitor. Restart losartan as BP tolerates.  T2DM - diet controlled Last A1C 5.7 in February 2020, 6.2 in 2018 CBG today 117 - Monitor with BMP  Dementia Patient on Namenda at home. Per chart review also has dementia-related depression, takes cymbalta - Continue home meds  HLD Home medications include lipitor  - Continue lipitor  GERD: Home medications include protonix -Continue protonix gtt  Hx of seizure disorder: on keppra at home. Unknown last seizure. Continue home keppra  Hx of polysubstance use Prior h/o extensive tobacco use (2-3 packs of cigars per day for 60 years). Prior alcohol use, last drink February 2020. Last had crack cocaine in February 2020. -  monitor symptoms  Hx of DVT Very limited mobility. No signs of DVT on  exam on admission.  Wearing SCDs -monitor for DVT   FEN/GI: NPO, sips with meds PPx: SCDs  Disposition: pending resolution of medical condition   Subjective:  Pt doing well. Denies chest pain, dyspnea or vomiting. No new concerns. Having more bowel prep this morning as not enough BMs overnight for colonoscopy.  Objective: Temp:  [98 F (36.7 C)-98.8 F (37.1 C)] 98.8 F (37.1 C) (07/17 0344) Pulse Rate:  [75-92] 82 (07/17 0344) Resp:  [16-18] 16 (07/17 0344) BP: (116-155)/(50-67) 127/63 (07/17 0344) SpO2:  [92 %-99 %] 95 % (07/17 0344)  General: More alert than before, pallor significantly improved, looks well, no acute distress, having bowel prep done with nurses HEENT: Neck non-tender without lymphadenopathy, masses or thyromegaly Cardio: Normal S1 and S2, no S3 or S4. Rhythm is regular. No murmurs or rubs.   Pulm: Clear to auscultation bilaterally, no crackles, wheezing, or diminished breath sounds. Normal respiratory effort Abdomen: Bowel sounds normal. Abdomen soft and non-tender.  Extremities: No peripheral edema. Warm/ well perfused.  Strong radial pulse. Neuro: Cranial nerves grossly intact  Laboratory: Recent Labs  Lab 08/24/18 0447 08/24/18 1514 08/25/18 0442  WBC 6.0 7.8 8.9  HGB 8.7* 9.8* 8.6*  HCT 30.5* 34.3* 30.5*  PLT 164 191 167   Recent Labs  Lab 08/22/18 1430  08/23/18 0513 08/24/18 0447 08/25/18 0442  NA 144   < > 147* 146* 144  K 3.2*   < > 3.2* 3.5 3.5  CL 113*   < > 114* 115* 113*  CO2 22  --  25 24 23   BUN 8   < > <5* 5* 8  CREATININE 0.84   < > 0.64 0.55* 0.67  CALCIUM 9.4  --  9.3 9.2 9.2  PROT 5.9*  --   --   --   --   BILITOT 0.8  --   --   --   --   ALKPHOS 67  --   --   --   --   ALT 11  --   --   --   --   AST 15  --   --   --   --   GLUCOSE 189*   < > 114* 102* 117*   < > = values in this interval not displayed.    Mr [name] Wo Contrast  Result Date: 08/25/2018 CLINICAL DATA:  68 y/o M; transient right-sided weakness.  Slurred speech. Altered level of consciousness. EXAM: MRI HEAD WITHOUT AND WITH CONTRAST TECHNIQUE: Multiplanar, multiecho pulse sequences of the brain and surrounding structures were obtained without and with intravenous contrast. CONTRAST:  10 cc Gadavist COMPARISON:  08/22/2018 CT head, CTA head, CT perfusion head. FINDINGS: Brain: Chronic right superior frontal parietal infarction. Diffuse susceptibility hypointensity throughout vascular structures compatible with systemic iron therapy. No mass effect, hydrocephalus, or herniation. Suboptimal assessment for subtle extra-axial hemorrhage due to iron therapy, no visible hemorrhage on recent CT. 2.4 cm length linear focus of faintly reduced diffusion and enhancement within right frontal periventricular white matter (series 20, image 17 and series 6, image 32) without mass effect, likely subacute infarction. No additional focus of abnormal intracranial enhancement. Background of moderate chronic microvascular ischemic changes and volume loss of the brain. Ex vacuo dilatation of the right lateral ventricle. Wallerian degeneration of cortical spinal tracts extending into the right hemi pons. Vascular: Normal flow voids. Skull and upper cervical spine:  Normal marrow signal. Sinuses/Orbits: Bilateral intra-ocular lens replacement. Partial opacification of mastoid air cells and mild mucosal thickening of the left maxillary sinus. Additional paranasal sinuses demonstrate normal signal. Other: None. IMPRESSION: 1. Small likely subacute infarction within right frontal periventricular white matter. No findings of acute stroke. 2. Chronic right superior frontoparietal infarction, moderate chronic microvascular ischemic changes of white matter, and volume loss of the brain. Electronically Signed   By: Kristine Garbe M.D.   On: 08/25/2018 01:53   Lattie Haw, MD 08/26/2018, 6:34 AM PGY-1, [place] Intern pager: [phone], text pages  welcome

## 2018-08-26 NOTE — Anesthesia Preprocedure Evaluation (Addendum)
Anesthesia Evaluation  Patient identified by MRN, date of birth, ID band Patient awake    Reviewed: Allergy & Precautions, NPO status , Patient's Chart, lab work & pertinent test results  Airway Mallampati: I  TM Distance: >3 FB Neck ROM: Full    Dental no notable dental hx. (+) Edentulous Upper, Edentulous Lower   Pulmonary shortness of breath, asthma , sleep apnea and Continuous Positive Airway Pressure Ventilation , COPD,  COPD inhaler, Current Smoker,    Pulmonary exam normal breath sounds clear to auscultation       Cardiovascular hypertension, + CAD, + Past MI (2000), + Peripheral Vascular Disease, +CHF, + DOE and + DVT  Normal cardiovascular exam+ dysrhythmias Atrial Fibrillation  Rhythm:Regular Rate:Normal  TTE 2020 EF 60-65%, no valvular abnormalities  Stress Test 2016 Nuclear stress EF: 47%. . The LV function is mildly reduced. There was no ST segment deviation noted during stress. This is a low risk study. Defect 1: There is a small defect of mild severity present in the basal inferolateral and mid inferolateral location. this defect is a small area of mild, nonreversible attenuation The left ventricular ejection fraction is mildly decreased (45-54%).   Carotid Doppler 2016 1-39% stenosis bilateral ICA   Neuro/Psych Seizures - (on keppra),  PSYCHIATRIC DISORDERS Anxiety Depression Dementia CVA (01/2018, left sided weakness), Residual Symptoms    GI/Hepatic GERD  Medicated,(+)     substance abuse (03/2018)  alcohol use and cocaine use,   Endo/Other  negative endocrine ROSdiabetes, Type 2  Renal/GU negative Renal ROS  negative genitourinary   Musculoskeletal negative musculoskeletal ROS (+)   Abdominal   Peds  Hematology  (+) Blood dyscrasia (Hgb 9.9, on plavix), anemia ,   Anesthesia Other Findings   Reproductive/Obstetrics                            Anesthesia  Physical Anesthesia Plan  ASA: IV  Anesthesia Plan: MAC   Post-op Pain Management:    Induction: Intravenous  PONV Risk Score and Plan: 0 and Treatment may vary due to age or medical condition and Propofol infusion  Airway Management Planned: Natural Airway and Simple Face Mask  Additional Equipment:   Intra-op Plan:   Post-operative Plan:   Informed Consent: I have reviewed the patients History and Physical, chart, labs and discussed the procedure including the risks, benefits and alternatives for the proposed anesthesia with the patient or authorized representative who has indicated his/her understanding and acceptance.   Patient has DNR.  Discussed DNR with patient and Suspend DNR.   Dental advisory given  Plan Discussed with: CRNA  Anesthesia Plan Comments:         Anesthesia Quick Evaluation

## 2018-08-26 NOTE — Transfer of Care (Signed)
Immediate Anesthesia Transfer of Care Note  Patient: [name]  Procedure(s) Performed: ESOPHAGOGASTRODUODENOSCOPY (EGD) WITH PROPOFOL (N/A ) FLEXIBLE SIGMOIDOSCOPY (N/A ) BIOPSY HOT HEMOSTASIS (ARGON PLASMA COAGULATION/BICAP) (N/A ) POLYPECTOMY  Patient Location: Endoscopy Unit  Anesthesia Type:MAC  Level of Consciousness: drowsy and patient cooperative  Airway & Oxygen Therapy: Patient Spontanous Breathing and Patient connected to nasal cannula oxygen  Post-op Assessment: Report given to RN and Post -op Vital signs reviewed and stable  Post vital signs: Reviewed and stable  Last Vitals:  Vitals Value Taken Time  BP 123/54 08/26/18 1045  Temp    Pulse 88 08/26/18 1045  Resp 25 08/26/18 1045  SpO2 96 % 08/26/18 1045  Vitals shown include unvalidated device data.  Last Pain:  Vitals:   08/26/18 1044  TempSrc: Oral  PainSc: 0-No pain         Complications: No apparent anesthesia complications

## 2018-08-26 NOTE — Progress Notes (Signed)
Got a call from Endoscopy regarding pt's Colonoscopy order, informed the Endo staff that pt had only had about 4 bowel movements despite the prep medication, and that the stool was not entirely clear yet, still mushy with sediments, pt is bed bound and could not sit up to use the bedside commode, the Endo staff said will informed the staff if the procedure can be moved forward, I will also relate information to the oncoming RN. [place]-Asidi, Marlon Suleiman Efe

## 2018-08-28 ENCOUNTER — Encounter (HOSPITAL_COMMUNITY): Payer: Self-pay | Admitting: Gastroenterology

## 2018-08-29 LAB — POCT PREGNANCY, URINE: Preg Test, Ur: POSITIVE — AB

## 2018-08-30 DIAGNOSIS — I10 Essential (primary) hypertension: Secondary | ICD-10-CM | POA: Diagnosis not present

## 2018-08-30 DIAGNOSIS — I2510 Atherosclerotic heart disease of native coronary artery without angina pectoris: Secondary | ICD-10-CM | POA: Diagnosis not present

## 2018-08-30 DIAGNOSIS — D500 Iron deficiency anemia secondary to blood loss (chronic): Secondary | ICD-10-CM | POA: Diagnosis not present

## 2018-08-30 DIAGNOSIS — J449 Chronic obstructive pulmonary disease, unspecified: Secondary | ICD-10-CM | POA: Diagnosis not present

## 2018-08-31 ENCOUNTER — Encounter: Payer: Self-pay | Admitting: Gastroenterology

## 2018-08-31 DIAGNOSIS — R5381 Other malaise: Secondary | ICD-10-CM | POA: Diagnosis not present

## 2018-08-31 DIAGNOSIS — R911 Solitary pulmonary nodule: Secondary | ICD-10-CM | POA: Diagnosis not present

## 2018-08-31 DIAGNOSIS — D649 Anemia, unspecified: Secondary | ICD-10-CM | POA: Diagnosis not present

## 2018-08-31 DIAGNOSIS — I6930 Unspecified sequelae of cerebral infarction: Secondary | ICD-10-CM | POA: Diagnosis not present

## 2018-09-02 DIAGNOSIS — Z7409 Other reduced mobility: Secondary | ICD-10-CM | POA: Diagnosis not present

## 2018-09-02 DIAGNOSIS — K5900 Constipation, unspecified: Secondary | ICD-10-CM | POA: Diagnosis not present

## 2018-09-02 DIAGNOSIS — G4701 Insomnia due to medical condition: Secondary | ICD-10-CM | POA: Diagnosis not present

## 2018-09-02 DIAGNOSIS — I6930 Unspecified sequelae of cerebral infarction: Secondary | ICD-10-CM | POA: Diagnosis not present

## 2018-09-02 DIAGNOSIS — I69354 Hemiplegia and hemiparesis following cerebral infarction affecting left non-dominant side: Secondary | ICD-10-CM | POA: Diagnosis not present

## 2018-09-06 ENCOUNTER — Other Ambulatory Visit: Payer: Self-pay

## 2018-09-06 NOTE — Patient Outreach (Signed)
[facility]) Care Management  09/06/2018  [name] [birth year] [id number]   Medication Adherence call to [name] patient is at a long term facility at this time and they provide with his medications.[name] is showing past due on Losartan 25 mg and Atorvastatin 80 mg under [facility].   [facility] Management Direct Dial [phone]  Fax [phone] [email or web address]

## 2018-09-07 ENCOUNTER — Telehealth: Payer: Self-pay | Admitting: Internal Medicine

## 2018-09-07 NOTE — Telephone Encounter (Signed)
R./s appt per 7/28 sch message - pt RN aware of appt .

## 2018-09-21 ENCOUNTER — Inpatient Hospital Stay: Payer: Medicare Other | Attending: Internal Medicine | Admitting: Internal Medicine

## 2018-09-21 ENCOUNTER — Other Ambulatory Visit: Payer: Self-pay

## 2018-09-21 ENCOUNTER — Encounter: Payer: Self-pay | Admitting: Internal Medicine

## 2018-09-21 ENCOUNTER — Telehealth: Payer: Self-pay | Admitting: Internal Medicine

## 2018-09-21 VITALS — BP 146/80 | HR 97 | Temp 98.5°F | Resp 20 | Ht 72.0 in

## 2018-09-21 DIAGNOSIS — R918 Other nonspecific abnormal finding of lung field: Secondary | ICD-10-CM | POA: Insufficient documentation

## 2018-09-21 DIAGNOSIS — R911 Solitary pulmonary nodule: Secondary | ICD-10-CM | POA: Diagnosis not present

## 2018-09-21 NOTE — Progress Notes (Signed)
[name] [phone]   [phone]  OFFICE PROGRESS NOTE  Charlott Rakes, MD [place] [address]  DIAGNOSIS: Suspicious stage Ia non-small cell lung cancer presented with right upper lobe pulmonary nodule, pending tissue diagnosis and staging work-up.  PRIOR THERAPY: None  CURRENT THERAPY: None  INTERVAL HISTORY: [name] 68 y.o. male returns to the clinic today for follow-up visit.  The patient was initially seen on April 25, 2018 for evaluation of suspicious right upper lobe pulmonary nodule.  He was supposed to have a PET scan and follow-up in 2 weeks but the patient was lost to follow-up for almost 5 months now.  He is now coming back for reevaluation and discussion of his pulmonary nodules again.  He had CT scan of the chest, abdomen pelvis performed on August 23, 2018 and that showed the right upper lobe noncalcified pulmonary nodule measuring 1.9 x 1.5 cm that stable compared to the previous scan in February 2020.  There was also right paratracheal node measuring 0.9, low right paratracheal node measuring 1.0 and a lower paratracheal node adjacent to the right mainstem bronchus measuring 0.9 cm.  This can also showed suspicious abnormal wall thickening in the rectum and a rectal mass with metastatic disease is not excluded. The patient denied having any current chest pain but has shortness of breath at baseline increased with exertion and he is currently on home oxygen.  He has no nausea, vomiting, diarrhea or constipation.  He denied having any headache or visual changes.  MEDICAL HISTORY: Past Medical History:  Diagnosis Date   Acute blood loss anemia    Acute CVA (cerebrovascular accident) ([place]) 08/27/2014   Acute encephalopathy 11/28/2017   Acute ischemic stroke (HCC)    AKI (acute kidney injury) ([place])    Alcohol abuse    H/O   Alcohol dependence with withdrawal with complication ([place]) 09/18/1749    Anxiety    Arterial ischemic stroke, MCA (middle cerebral artery), right, acute (HCC)    Arthritis    Asthma    Binocular vision disorder with diplopia 01/03/2013   CAD (coronary artery disease) 05/27/10   Cath: severe single vessell CAD left cx midportion obtuse marginal 2 to 3.   Cerebral infarction due to embolism of right middle cerebral artery (HCC)    Cerebral infarction due to stenosis of right carotid artery ([place]) 11/01/2014   Cerebral thrombosis with cerebral infarction 07/18/2016   Cerebrovascular accident (CVA) due to stenosis of right carotid artery (HCC)    CHF (congestive heart failure) ([place])    Closed blow-out fracture of floor of orbit ([place]) 01/19/2013   COPD (chronic obstructive pulmonary disease) (HCC)    COPD (chronic obstructive pulmonary disease) (HCC)    CVA (cerebral vascular accident) ([place]) 08/26/2014   Dementia due to another medical condition ([place]) 07/12/2015   Depression    Diabetes mellitus, type 2 ([place]) 0/03/5850   Diastolic CHF, acute on chronic ([place]) 11/01/2014   DOE (dyspnea on exertion) 11/23/2014   DVT (deep venous thrombosis) ([place]) 7/78/2423   Embolic stroke ([place])    Enophthalmos due to trauma 01/19/2013   Eyelid lesion 01/24/2013   Fracture of inferior orbital wall ([place]) 01/03/2013   Fracture of orbital floor ([place]) 01/03/2013   GERD (gastroesophageal reflux disease)    H/O total knee replacement 10/24/2014   HCAP (healthcare-associated pneumonia) 10/15/2015   Headache around the eyes 08/27/2014   History of DVT (deep vein thrombosis)  History of DVT of lower extremity    Hyperkalemia 10/24/2014   Hyperlipemia    Hyperlipidemia    Hypertension    Hypoalbuminemia due to protein-calorie malnutrition ([place])    Hypotension    Labile blood pressure    Left shoulder pain 09/28/2017   Lower extremity edema 02/21/2014   Medial orbital wall fracture 01/19/2013   Myocardial infarction [facility]) 2000   Orbital fracture  12/2012   OSA on CPAP    Pain of left eye 01/03/2013   Peripheral vascular disease, unspecified ([place])    08/20/10 doppler: increase in right ABI post-op. Left ABI stable. S/P bi-fem bypass surgery   Pneumonia 04/04/2012   Right middle cerebral artery stroke ([place]) 01/18/2018   Seizures ([place])    Shortness of breath    Stroke (cerebrum) ([place]) 02/06/2015   Thrombocytopenia (HCC)    Tobacco abuse    Trichiasis without entropion 04/16/2013   Overview:  LLL central    Type 2 diabetes mellitus with peripheral neuropathy ([place]) 03/13/2015   Urinary incontinence 03/14/2015    ALLERGIES:  is allergic to darvocet [propoxyphene n-acetaminophen]; haldol [haloperidol decanoate]; acetaminophen; metformin and related; and norco [hydrocodone-acetaminophen].  MEDICATIONS:  Current Outpatient Medications  Medication Sig Dispense Refill   acetaminophen (TYLENOL) 325 MG tablet Take 2 tablets (650 mg total) by mouth every 4 (four) hours as needed for mild pain (or temp > 37.5 C (99.5 F)).     allopurinol (ZYLOPRIM) 300 MG tablet Take 1 tablet (300 mg total) by mouth daily. 30 tablet 3   atorvastatin (LIPITOR) 80 MG tablet Take 1 tablet (80 mg total) by mouth daily at 6 PM. (Patient taking differently: Take 80 mg by mouth daily. ) 30 tablet 3   camphor-menthol (SARNA) lotion Apply topically as needed for itching. 222 mL 0   Cholecalciferol 25 MCG (1000 UT) tablet Take 1,000 Units by mouth daily.     clopidogrel (PLAVIX) 75 MG tablet Take 1 tablet (75 mg total) by mouth daily. 30 tablet 3   DULoxetine (CYMBALTA) 60 MG capsule Take 1 capsule (60 mg total) by mouth daily. 30 capsule 3   ferrous sulfate 325 (65 FE) MG tablet Take 1 tablet (325 mg total) by mouth daily with breakfast.  3   folic acid (FOLVITE) 1 MG tablet Take 1 tablet (1 mg total) by mouth daily.     guaiFENesin (MUCINEX) 600 MG 12 hr tablet Take 1,200 mg by mouth 2 (two) times daily.     hydrocortisone 2.5 % cream Apply 1  application topically every 6 (six) hours as needed (itchiness).     Infant Care Products (DERMACLOUD) CREA Apply 1 application topically 2 (two) times daily. Apply to buttocks     levETIRAcetam (KEPPRA) 500 MG tablet Take 1 tablet (500 mg total) by mouth 2 (two) times daily. 60 tablet 3   losartan (COZAAR) 25 MG tablet Take 1 tablet (25 mg total) by mouth daily. 30 tablet 2   Melatonin 5 MG TABS Take 5 mg by mouth at bedtime.     memantine (NAMENDA) 10 MG tablet Take 1 tablet (10 mg total) by mouth 2 (two) times daily. 6 tablet 0   mometasone-formoterol (DULERA) 200-5 MCG/ACT AERO Inhale 2 puffs into the lungs 2 (two) times daily.     pantoprazole (PROTONIX) 40 MG tablet Take 1 tablet (40 mg total) by mouth daily. 30 tablet 3   polyethylene glycol (MIRALAX / GLYCOLAX) 17 g packet Take 17 g by mouth daily as needed for moderate  constipation.     pregabalin (LYRICA) 150 MG capsule Take 1 capsule (150 mg total) by mouth 2 (two) times daily. 180 capsule 0   Skin Protectants, Misc. (EUCERIN) cream Apply 1 application topically daily.     tiZANidine (ZANAFLEX) 2 MG tablet Take 2 mg by mouth 3 (three) times daily.     traZODone (DESYREL) 100 MG tablet Take 100 mg by mouth at bedtime.     WIXELA INHUB 250-50 MCG/DOSE AEPB Inhale 1-2 puffs into the lungs daily. 1 each 0   No current facility-administered medications for this visit.     SURGICAL HISTORY:  Past Surgical History:  Procedure Laterality Date   abdoninal ao angio & bifem angio  05/27/10   Patent graft, occluded bil stents with no retrograde flow into the hypogastric arteries. 100% occl left ant. tibial artery, 70% to 80% to 100% stenosis right superficial fem artery above adductor canal. 100 % occl right ant. tibial vessell   Aortogram w/ PTCA  09/292003   BACK SURGERY     BIOPSY  08/26/2018   Procedure: BIOPSY;  Surgeon: Lavena Bullion, DO;  Location: [facility]COPY;  Service: Gastroenterology;;   CARDIAC  CATHETERIZATION  05/27/10   severe CAD left cx   CHOLECYSTECTOMY     ESOPHAGOGASTRIC FUNDOPLICATION     ESOPHAGOGASTRODUODENOSCOPY (EGD) WITH PROPOFOL N/A 08/26/2018   Procedure: ESOPHAGOGASTRODUODENOSCOPY (EGD) WITH PROPOFOL;  Surgeon: Lavena Bullion, DO;  Location: [facility];  Service: Gastroenterology;  Laterality: N/A;   EYE SURGERY     FEMORAL BYPASS  08/19/10   Right Fem-Pop   FLEXIBLE SIGMOIDOSCOPY N/A 08/26/2018   Procedure: FLEXIBLE SIGMOIDOSCOPY;  Surgeon: Lavena Bullion, DO;  Location: [facility];  Service: Gastroenterology;  Laterality: N/A;   HOT HEMOSTASIS N/A 08/26/2018   Procedure: HOT HEMOSTASIS (ARGON PLASMA COAGULATION/BICAP);  Surgeon: Lavena Bullion, DO;  Location[facility];  Service: Gastroenterology;  Laterality: N/A;   IR ANGIO INTRA EXTRACRAN SEL COM CAROTID INNOMINATE BILAT MOD SED  07/20/2016   IR ANGIO INTRA EXTRACRAN SEL COM CAROTID INNOMINATE BILAT MOD SED  01/14/2018   IR ANGIO VERTEBRAL SEL SUBCLAVIAN INNOMINATE UNI R MOD SED  07/20/2016   IR ANGIO VERTEBRAL SEL VERTEBRAL BILAT MOD SED  01/14/2018   IR ANGIO VERTEBRAL SEL VERTEBRAL UNI L MOD SED  07/20/2016   IR RADIOLOGIST EVAL & MGMT  05/27/2016   IR US GUIDE VASC ACCESS RIGHT  01/14/2018   JOINT REPLACEMENT     PERIPHERAL VASCULAR CATHETERIZATION N/A 12/10/2014   Procedure: Abdominal Aortogram;  Surgeon: Angelia Mould, MD;  Location: [facility] CV LAB;  Service: Cardiovascular;  Laterality: N/A;   POLYPECTOMY  08/26/2018   Procedure: POLYPECTOMY;  Surgeon: Lavena Bullion, DO;  Location: [facility]COPY;  Service: Gastroenterology;;   RADIOLOGY WITH ANESTHESIA N/A 02/08/2015   Procedure: RADIOLOGY WITH ANESTHESIA;  Surgeon: Luanne Bras, MD;  Location: [facility];  Service: Radiology;  Laterality: N/A;   TEE WITHOUT CARDIOVERSION N/A 08/29/2014   Procedure: TRANSESOPHAGEAL ECHOCARDIOGRAM (TEE);  Surgeon: Josue Hector, MD;  Location: [facility] ENDOSCOPY;  Service:  Cardiovascular;  Laterality: N/A;   TOTAL KNEE ARTHROPLASTY      REVIEW OF SYSTEMS:  A comprehensive review of systems was negative except for: Constitutional: positive for fatigue Respiratory: positive for cough and dyspnea on exertion Musculoskeletal: positive for muscle weakness   PHYSICAL EXAMINATION: General appearance: alert, cooperative, fatigued and no distress Head: Normocephalic, without obvious abnormality, atraumatic Neck: no adenopathy, no JVD, supple, symmetrical, trachea midline and thyroid  not enlarged, symmetric, no tenderness/mass/nodules Lymph nodes: Cervical, supraclavicular, and axillary nodes normal. Resp: clear to auscultation bilaterally Back: symmetric, no curvature. ROM normal. No CVA tenderness. Cardio: regular rate and rhythm, S1, S2 normal, no murmur, click, rub or gallop GI: soft, non-tender; bowel sounds normal; no masses,  no organomegaly Extremities: extremities normal, atraumatic, no cyanosis or edema  ECOG PERFORMANCE STATUS: 1 - Symptomatic but completely ambulatory  Blood pressure (!) 146/80, pulse 97, temperature 98.5 F (36.9 C), temperature source Oral, resp. rate 20, height 6' (1.829 m), SpO2 96 %.  LABORATORY DATA: Lab Results  Component Value Date   WBC 9.3 08/26/2018   HGB 9.9 (L) 08/26/2018   HCT 35.5 (L) 08/26/2018   MCV 81.1 08/26/2018   PLT 181 08/26/2018      Chemistry      Component Value Date/Time   NA 148 (H) 08/26/2018 0626   NA 145 (H) 08/19/2017 0952   K 3.1 (L) 08/26/2018 0626   CL 114 (H) 08/26/2018 0626   CO2 27 08/26/2018 0626   BUN <5 (L) 08/26/2018 0626   BUN 14 08/19/2017 0952   CREATININE 0.61 08/26/2018 0626   CREATININE 0.83 04/25/2018 1354   CREATININE CANCELED 04/28/2016 1515      Component Value Date/Time   CALCIUM 9.4 08/26/2018 0626   ALKPHOS 67 08/22/2018 1430   AST 15 08/22/2018 1430   AST 17 04/25/2018 1354   ALT 11 08/22/2018 1430   ALT 17 04/25/2018 1354   BILITOT 0.8 08/22/2018 1430    BILITOT 0.5 04/25/2018 1354       RADIOGRAPHIC STUDIES: Ct Angio Head W Or Wo Contrast  Result Date: 08/22/2018 CLINICAL DATA:  Right-sided weakness and slurred speech. EXAM: CT ANGIOGRAPHY HEAD AND NECK CT PERFUSION BRAIN TECHNIQUE: Multidetector CT imaging of the head and neck was performed using the standard protocol during bolus administration of intravenous contrast. Multiplanar CT image reconstructions and MIPs were obtained to evaluate the vascular anatomy. Carotid stenosis measurements (when applicable) are obtained utilizing NASCET criteria, using the distal internal carotid diameter as the denominator. Multiphase CT imaging of the brain was performed following IV bolus contrast injection. Subsequent parametric perfusion maps were calculated using RAPID software. CONTRAST:  47mL OMNIPAQUE IOHEXOL 350 MG/ML SOLN COMPARISON:  03/25/2018.  December 2019. head CT earlier today. FINDINGS: CTA NECK FINDINGS Aortic arch: Advanced aortic atherosclerosis with irregular plaque particularly at the arch. Branching pattern is normal. 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 Right carotid system: Common carotid artery shows extensive soft and calcified plaque but is sufficiently patent to the distal common carotid artery where there is a carotid artery stent. There is flow in the external carotid artery but no flow in the internal carotid artery due to chronic occlusion at this level. Left carotid system: Common carotid artery shows some atherosclerotic plaque along its course but no stenosis proximal to the bifurcation. There is calcified plaque at the carotid bifurcation and ICA bulb. Minimal diameter is 4 mm, the same as the more distal cervical ICA, therefore no stenosis. Vertebral arteries: Right vertebral artery origin shows calcified plaque with stenosis of 50%. Left vertebral artery origin shows calcified plaque with stenosis also estimated at 50%. Both vertebral arteries  are patent through the cervical region, with foci of atherosclerotic calcification resulting in 30-50% narrowing. No critical vertebral stenosis is seen. Skeleton: Ordinary spondylosis.  Chronic fusion C2-3. Other neck: No mass or lymphadenopathy. Upper chest: 2 cm spiculated mass in the right upper  lobe highly likely to represent lung carcinoma. This has not changed much since February but was not present in 2014. Review of the MIP images confirms the above findings CTA HEAD FINDINGS Anterior circulation: Left internal carotid artery is patent through the skull base and siphon region. There is siphon atherosclerotic calcification with stenosis estimated at 50%. Left anterior and middle cerebral vessels show flow, with mild atherosclerotic irregularity but no evidence of large or medium vessel occlusion. The right internal carotid artery does not show flow at the skull base. There is a stent of the carotid siphon region. There is reconstitution of the supraclinoid ICA probably from a combination of external to internal collaterals and flow through a patent anterior communicating artery. Flow is present within the right anterior and middle cerebral vessels, though the caliber is diminished when compared to the right side. No change since the previous study. Posterior circulation: Both vertebral arteries are patent through the foramen magnum to the basilar. No basilar stenosis. Posterior circulation branch vessels are patent. Atherosclerotic irregularity of the PCA branches. Venous sinuses: Patent and normal. Anatomic variants: None significant. Delayed phase: Not performed. Review of the MIP images confirms the above findings CT Brain Perfusion Findings: Severely motion degraded exam, without useful data because of this. IMPRESSION: No large or medium vessel occlusion identified acutely. Perfusion imaging is severely degraded by motion and not useful. Advanced chronic atherosclerotic disease of the aorta. 50%  stenosis of the innominate artery origin. 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 Right carotid siphon stent without proximal flow. Flow in the left anterior and middle cerebral arteries likely due to a patent anterior communicating artery. Diminished caliber related to diminished flow volume. No acute finding however. Left carotid bifurcation atherosclerotic disease with no measurable stenosis. No left anterior circulation large or medium vessel occlusion. 50% stenoses at both vertebral artery origins. Serial focal stenoses throughout both vertebral arteries, but without critical stenosis. Perfusion study suffers from severe motion and is not usable. 2 cm spiculated mass in the right upper lobe consistent with lung carcinoma. Little change since February. However, the lesion was not present in 2014. These results were communicated to Dr. Rory Percy at 3:45 pmon 7/13/2020by text page via the [facility] messaging system. Electronically Signed   By: Nelson Chimes M.D.   On: 08/22/2018 15:56   Ct Angio Neck W Or Wo Contrast  Result Date: 08/22/2018 CLINICAL DATA:  Right-sided weakness and slurred speech. EXAM: CT ANGIOGRAPHY HEAD AND NECK CT PERFUSION BRAIN TECHNIQUE: Multidetector CT imaging of the head and neck was performed using the standard protocol during bolus administration of intravenous contrast. Multiplanar CT image reconstructions and MIPs were obtained to evaluate the vascular anatomy. Carotid stenosis measurements (when applicable) are obtained utilizing NASCET criteria, using the distal internal carotid diameter as the denominator. Multiphase CT imaging of the brain was performed following IV bolus contrast injection. Subsequent parametric perfusion maps were calculated using RAPID software. CONTRAST:  46mL OMNIPAQUE IOHEXOL 350 MG/ML SOLN COMPARISON:  03/25/2018.  December 2019. head CT earlier today. FINDINGS: CTA NECK FINDINGS Aortic arch: Advanced aortic atherosclerosis with  irregular plaque particularly at the arch. Branching pattern is normal. 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 Right carotid system: Common carotid artery shows extensive soft and calcified plaque but is sufficiently patent to the distal common carotid artery where there is a carotid artery stent. There is flow in the external carotid artery but no flow in the internal  carotid artery due to chronic occlusion at this level. Left carotid system: Common carotid artery shows some atherosclerotic plaque along its course but no stenosis proximal to the bifurcation. There is calcified plaque at the carotid bifurcation and ICA bulb. Minimal diameter is 4 mm, the same as the more distal cervical ICA, therefore no stenosis. Vertebral arteries: Right vertebral artery origin shows calcified plaque with stenosis of 50%. Left vertebral artery origin shows calcified plaque with stenosis also estimated at 50%. Both vertebral arteries are patent through the cervical region, with foci of atherosclerotic calcification resulting in 30-50% narrowing. No critical vertebral stenosis is seen. Skeleton: Ordinary spondylosis.  Chronic fusion C2-3. Other neck: No mass or lymphadenopathy. Upper chest: 2 cm spiculated mass in the right upper lobe highly likely to represent lung carcinoma. This has not changed much since February but was not present in 2014. Review of the MIP images confirms the above findings CTA HEAD FINDINGS Anterior circulation: Left internal carotid artery is patent through the skull base and siphon region. There is siphon atherosclerotic calcification with stenosis estimated at 50%. Left anterior and middle cerebral vessels show flow, with mild atherosclerotic irregularity but no evidence of large or medium vessel occlusion. The right internal carotid artery does not show flow at the skull base. There is a stent of the carotid siphon region. There is reconstitution of the supraclinoid ICA  probably from a combination of external to internal collaterals and flow through a patent anterior communicating artery. Flow is present within the right anterior and middle cerebral vessels, though the caliber is diminished when compared to the right side. No change since the previous study. Posterior circulation: Both vertebral arteries are patent through the foramen magnum to the basilar. No basilar stenosis. Posterior circulation branch vessels are patent. Atherosclerotic irregularity of the PCA branches. Venous sinuses: Patent and normal. Anatomic variants: None significant. Delayed phase: Not performed. Review of the MIP images confirms the above findings CT Brain Perfusion Findings: Severely motion degraded exam, without useful data because of this. IMPRESSION: No large or medium vessel occlusion identified acutely. Perfusion imaging is severely degraded by motion and not useful. Advanced chronic atherosclerotic disease of the aorta. 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 Right carotid siphon stent without proximal flow. Flow in the left anterior and middle cerebral arteries likely due to a patent anterior communicating artery. Diminished caliber related to diminished flow volume. No acute finding however. Left carotid bifurcation atherosclerotic disease with no measurable stenosis. No left anterior circulation large or medium vessel occlusion. 50% stenoses at both vertebral artery origins. Serial focal stenoses throughout both vertebral arteries, but without critical stenosis. Perfusion study suffers from severe motion and is not usable. 2 cm spiculated mass in the right upper lobe consistent with lung carcinoma. Little change since February. However, the lesion was not present in 2014. These results were communicated to Dr. Rory Percy at 3:45 pmon 7/13/2020by text page via the [facility] messaging system. Electronically Signed   By: Nelson Chimes M.D.   On: 08/22/2018 15:56   Ct Chest W Contrast  Result Date: 08/24/2018 CLINICAL DATA:  Lung nodule.  Iron deficiency anemia. EXAM: CT CHEST, ABDOMEN, AND PELVIS WITH CONTRAST TECHNIQUE: Multidetector CT imaging of the chest, abdomen and pelvis was performed following the standard protocol during bolus administration of intravenous contrast. CONTRAST:  179mL OMNIPAQUE IOHEXOL 300 MG/ML  SOLN COMPARISON:  Multiple exams, including CT chest from 03/28/2018 and  CT scan from 12/12/2012 FINDINGS: CT CHEST FINDINGS Cardiovascular: Coronary, aortic arch, and branch vessel atherosclerotic vascular disease. Mild cardiomegaly. Small pericardial effusion. Mediastinum/Nodes: Right paratracheal node 0.9 cm on image 15/3, formerly 0.7 cm. Lower right paratracheal node 1.0 cm on image 1/3, formerly 0.9 cm. A lower paratracheal node adjacent to the right mainstem bronchus measures 0.9 cm short axis on image 25/3, stable. Lungs/Pleura: Right upper lobe noncalcified pulmonary nodule measures 1.9 by 1.5 cm on image 66/4. This is stable compared to 03/28/2018 but back on 12/12/2012 there is no readily appreciable nodule in this vicinity. There is a separate calcified granuloma in the right upper lobe. Bandlike scarring or atelectasis in both lower lobes dependently. Trace left pleural effusion. Musculoskeletal: Mild thoracic spondylosis. CT ABDOMEN PELVIS FINDINGS Hepatobiliary: Cholecystectomy.  Otherwise unremarkable. Pancreas: Unremarkable Spleen: Unremarkable Adrenals/Urinary Tract: 0.9 by 1.4 cm nodule of the apex of the left adrenal gland, not changed from 12/12/2012 hence considered benign. A 0.8 cm hypodense exophytic lesion of left mid kidney posteriorly on image 72/3 is probably a cyst but technically too small to characterize. Vascular calcifications are noted at the renal hila. Excreted contrast medium is present in the urinary bladder, likely from yesterday's CT angiography. Stomach/Bowel: There is evidence  of a prior fundoplication at the hiatus. Diffuse wall thickening is present in the rectum with single wall thickness approximately 10 mm on image 118/3. Vascular/Lymphatic: Aortoiliac atherosclerotic vascular disease. Aortobifemoral stent graft. This appears patent. By femoral graft with a midline stent, this by femoral graft appears occluded. Probably occluded femoral to distal grafts. Reproductive: Speckled calcifications in the prostate gland. Other: No supplemental non-categorized findings. Musculoskeletal: Mild lumbar spondylosis and degenerative disc disease. IMPRESSION: 1. Right upper lobe 1.9 by 1.5 cm pulmonary nodule, stable from 03/28/2018 but new compared to 12/12/2012. A slow growing neoplasm/malignancy is a distinct possibility. The lesion has lobular margins with some minimal spiculation. Although metastatic lesion is not excluded, lung primary is probably more likely. 2. That said, there is abnormal wall thickening in the rectum, and a rectal mass with metastatic disease is likewise not excluded. 3. Other imaging findings of potential clinical significance: Aortic Atherosclerosis (ICD10-I70.0). Coronary atherosclerosis. Mild cardiomegaly. Small pericardial effusion. Upper normal sized right paratracheal lymph nodes. Chronically stable small left adrenal nodule, likely benign. Prior fundoplication. Aortobifemoral stent graft is patent. By femoral graft in femoral to distal grafts are likely occluded. Electronically Signed   By: Van Clines M.D.   On: 08/24/2018 09:05   [name] TM Contrast  Result Date: 08/25/2018 CLINICAL DATA:  68 y/o M; transient right-sided weakness. Slurred speech. Altered level of consciousness. EXAM: MRI HEAD WITHOUT AND WITH CONTRAST TECHNIQUE: Multiplanar, multiecho pulse sequences of the brain and surrounding structures were obtained without and with intravenous contrast. CONTRAST:  10 cc Gadavist COMPARISON:  08/22/2018 CT head, CTA head, CT perfusion head.  FINDINGS: Brain: Chronic right superior frontal parietal infarction. Diffuse susceptibility hypointensity throughout vascular structures compatible with systemic iron therapy. No mass effect, hydrocephalus, or herniation. Suboptimal assessment for subtle extra-axial hemorrhage due to iron therapy, no visible hemorrhage on recent CT. 2.4 cm length linear focus of faintly reduced diffusion and enhancement within right frontal periventricular white matter (series 20, image 17 and series 6, image 32) without mass effect, likely subacute infarction. No additional focus of abnormal intracranial enhancement. Background of moderate chronic microvascular ischemic changes and volume loss of the brain. Ex vacuo dilatation of the right lateral ventricle. Wallerian degeneration of cortical spinal tracts extending into  the right hemi pons. Vascular: Normal flow voids. Skull and upper cervical spine: Normal marrow signal. Sinuses/Orbits: Bilateral intra-ocular lens replacement. Partial opacification of mastoid air cells and mild mucosal thickening of the left maxillary sinus. Additional paranasal sinuses demonstrate normal signal. Other: None. IMPRESSION: 1. Small likely subacute infarction within right frontal periventricular white matter. No findings of acute stroke. 2. Chronic right superior frontoparietal infarction, moderate chronic microvascular ischemic changes of white matter, and volume loss of the brain. Electronically Signed   By: Kristine Garbe M.D.   On: 08/25/2018 01:53   Ct Abdomen Pelvis W Contrast  Result Date: 08/24/2018 CLINICAL DATA:  Lung nodule.  Iron deficiency anemia. EXAM: CT CHEST, ABDOMEN, AND PELVIS WITH CONTRAST TECHNIQUE: Multidetector CT imaging of the chest, abdomen and pelvis was performed following the standard protocol during bolus administration of intravenous contrast. CONTRAST:  122mL OMNIPAQUE IOHEXOL 300 MG/ML  SOLN COMPARISON:  Multiple exams, including CT chest from  03/28/2018 and CT scan from 12/12/2012 FINDINGS: CT CHEST FINDINGS Cardiovascular: Coronary, aortic arch, and branch vessel atherosclerotic vascular disease. Mild cardiomegaly. Small pericardial effusion. Mediastinum/Nodes: Right paratracheal node 0.9 cm on image 15/3, formerly 0.7 cm. Lower right paratracheal node 1.0 cm on image 1/3, formerly 0.9 cm. A lower paratracheal node adjacent to the right mainstem bronchus measures 0.9 cm short axis on image 25/3, stable. Lungs/Pleura: Right upper lobe noncalcified pulmonary nodule measures 1.9 by 1.5 cm on image 66/4. This is stable compared to 03/28/2018 but back on 12/12/2012 there is no readily appreciable nodule in this vicinity. There is a separate calcified granuloma in the right upper lobe. Bandlike scarring or atelectasis in both lower lobes dependently. Trace left pleural effusion. Musculoskeletal: Mild thoracic spondylosis. CT ABDOMEN PELVIS FINDINGS Hepatobiliary: Cholecystectomy.  Otherwise unremarkable. Pancreas: Unremarkable Spleen: Unremarkable Adrenals/Urinary Tract: 0.9 by 1.4 cm nodule of the apex of the left adrenal gland, not changed from 12/12/2012 hence considered benign. A 0.8 cm hypodense exophytic lesion of left mid kidney posteriorly on image 72/3 is probably a cyst but technically too small to characterize. Vascular calcifications are noted at the renal hila. Excreted contrast medium is present in the urinary bladder, likely from yesterday's CT angiography. Stomach/Bowel: There is evidence of a prior fundoplication at the hiatus. Diffuse wall thickening is present in the rectum with single wall thickness approximately 10 mm on image 118/3. Vascular/Lymphatic: Aortoiliac atherosclerotic vascular disease. Aortobifemoral stent graft. This appears patent. By femoral graft with a midline stent, this by femoral graft appears occluded. Probably occluded femoral to distal grafts. Reproductive: Speckled calcifications in the prostate gland. Other: No  supplemental non-categorized findings. Musculoskeletal: Mild lumbar spondylosis and degenerative disc disease. IMPRESSION: 1. Right upper lobe 1.9 by 1.5 cm pulmonary nodule, stable from 03/28/2018 but new compared to 12/12/2012. A slow growing neoplasm/malignancy is a distinct possibility. The lesion has lobular margins with some minimal spiculation. Although metastatic lesion is not excluded, lung primary is probably more likely. 2. That said, there is abnormal wall thickening in the rectum, and a rectal mass with metastatic disease is likewise not excluded. 3. Other imaging findings of potential clinical significance: Aortic Atherosclerosis (ICD10-I70.0). Coronary atherosclerosis. Mild cardiomegaly. Small pericardial effusion. Upper normal sized right paratracheal lymph nodes. Chronically stable small left adrenal nodule, likely benign. Prior fundoplication. Aortobifemoral stent graft is patent. By femoral graft in femoral to distal grafts are likely occluded. Electronically Signed   By: Van Clines M.D.   On: 08/24/2018 09:05   Ct Cerebral Perfusion W Contrast  Result Date: 08/22/2018  CLINICAL DATA:  Right-sided weakness and slurred speech. EXAM: CT ANGIOGRAPHY HEAD AND NECK CT PERFUSION BRAIN TECHNIQUE: Multidetector CT imaging of the head and neck was performed using the standard protocol during bolus administration of intravenous contrast. Multiplanar CT image reconstructions and MIPs were obtained to evaluate the vascular anatomy. Carotid stenosis measurements (when applicable) are obtained utilizing NASCET criteria, using the distal internal carotid diameter as the denominator. Multiphase CT imaging of the brain was performed following IV bolus contrast injection. Subsequent parametric perfusion maps were calculated using RAPID software. CONTRAST:  83mL OMNIPAQUE IOHEXOL 350 MG/ML SOLN COMPARISON:  03/25/2018.  December 2019. head CT earlier today. FINDINGS: CTA NECK FINDINGS Aortic arch:  Advanced aortic atherosclerosis with irregular plaque particularly at the arch. Branching pattern is normal. 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 Right carotid system: Common carotid artery shows extensive soft and calcified plaque but is sufficiently patent to the distal common carotid artery where there is a carotid artery stent. There is flow in the external carotid artery but no flow in the internal carotid artery due to chronic occlusion at this level. Left carotid system: Common carotid artery shows some atherosclerotic plaque along its course but no stenosis proximal to the bifurcation. There is calcified plaque at the carotid bifurcation and ICA bulb. Minimal diameter is 4 mm, the same as the more distal cervical ICA, therefore no stenosis. Vertebral arteries: Right vertebral artery origin shows calcified plaque with stenosis of 50%. Left vertebral artery origin shows calcified plaque with stenosis also estimated at 50%. Both vertebral arteries are patent through the cervical region, with foci of atherosclerotic calcification resulting in 30-50% narrowing. No critical vertebral stenosis is seen. Skeleton: Ordinary spondylosis.  Chronic fusion C2-3. Other neck: No mass or lymphadenopathy. Upper chest: 2 cm spiculated mass in the right upper lobe highly likely to represent lung carcinoma. This has not changed much since February but was not present in 2014. Review of the MIP images confirms the above findings CTA HEAD FINDINGS Anterior circulation: Left internal carotid artery is patent through the skull base and siphon region. There is siphon atherosclerotic calcification with stenosis estimated at 50%. Left anterior and middle cerebral vessels show flow, with mild atherosclerotic irregularity but no evidence of large or medium vessel occlusion. The right internal carotid artery does not show flow at the skull base. There is a stent of the carotid siphon region. There is  reconstitution of the supraclinoid ICA probably from a combination of external to internal collaterals and flow through a patent anterior communicating artery. Flow is present within the right anterior and middle cerebral vessels, though the caliber is diminished when compared to the right side. No change since the previous study. Posterior circulation: Both vertebral arteries are patent through the foramen magnum to the basilar. No basilar stenosis. Posterior circulation branch vessels are patent. Atherosclerotic irregularity of the PCA branches. Venous sinuses: Patent and normal. Anatomic variants: None significant. Delayed phase: Not performed. Review of the MIP images confirms the above findings CT Brain Perfusion Findings: Severely motion degraded exam, without useful data because of this. IMPRESSION: No large or medium vessel occlusion identified acutely. Perfusion imaging is severely degraded by motion and not useful. Advanced chronic atherosclerotic disease of the aorta. 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 Right carotid siphon stent without proximal flow. Flow in the left anterior and middle cerebral arteries likely due to a patent anterior  communicating artery. Diminished caliber related to diminished flow volume. No acute finding however. Left carotid bifurcation atherosclerotic disease with no measurable stenosis. No left anterior circulation large or medium vessel occlusion. 50% stenoses at both vertebral artery origins. Serial focal stenoses throughout both vertebral arteries, but without critical stenosis. Perfusion study suffers from severe motion and is not usable. 2 cm spiculated mass in the right upper lobe consistent with lung carcinoma. Little change since February. However, the lesion was not present in 2014. These results were communicated to Dr. Rory Percy at 3:45 pmon 7/13/2020by text page via the [facility] messaging  system. Electronically Signed   By: Nelson Chimes M.D.   On: 08/22/2018 15:56   Ct Head Code Stroke Wo Contrast  Result Date: 08/22/2018 CLINICAL DATA:  Code stroke. Altered level of consciousness. Right-sided deficit. Slurred speech EXAM: CT HEAD WITHOUT CONTRAST TECHNIQUE: Contiguous axial images were obtained from the base of the skull through the vertex without intravenous contrast. COMPARISON:  CT head 01/12/2018 FINDINGS: Brain: Mild atrophy. Ventricle size normal. Chronic right MCA infarct over the convexity is unchanged. Negative for acute infarct, hemorrhage, or mass.  No midline shift. Vascular: Negative for hyperdense vessel Skull: Negative Sinuses/Orbits: Surgical repair of left orbital floor fracture. Mucosal edema left maxillary sinus. Bilateral cataract surgery. Other: None ASPECTS ([place] Stroke Program Early CT Score) - Ganglionic level infarction (caudate, lentiform nuclei, internal capsule, insula, M1-M3 cortex): 7 - Supraganglionic infarction (M4-M6 cortex): 3 Total score (0-10 with 10 being normal): 10 IMPRESSION: 1. No acute abnormality 2. Chronic right MCA infarct 3. ASPECTS is 10 4. These results were called by telephone at the time of interpretation on 08/22/2018 at 2:45 pm to Dr. Rory Percy, who verbally acknowledged these results. Electronically Signed   By: Franchot Gallo M.D.   On: 08/22/2018 14:45    ASSESSMENT AND PLAN: This is a very pleasant 68 years old white male with likely stage Ia non-small cell lung cancer presented with right upper lobe pulmonary nodule pending tissue diagnosis and further staging work-up. I recommended for the patient to have the PET scan performed next week for further evaluation of the pulmonary nodule as well as the rectal mass. I will see him back for follow-up visit in 2 weeks for evaluation and discussion of the PET scan results and further recommendation regarding his condition.  If the lesion is hypermetabolic on the PET scan, may consider the  patient for CT-guided core biopsy followed by SBRT since the patient is not a good surgical candidate for resection. He was advised to call immediately if he has any concerning symptoms in the interval. The patient voices understanding of current disease status and treatment options and is in agreement with the current care plan.  All questions were answered. The patient knows to call the clinic with any problems, questions or concerns. We can certainly see the patient much sooner if necessary.  I spent 10 minutes counseling the patient face to face. The total time spent in the appointment was 15 minutes.  Disclaimer: This note was dictated with voice recognition software. Similar sounding words can inadvertently be transcribed and may not be corrected upon review.

## 2018-09-21 NOTE — Telephone Encounter (Signed)
Scheduled appt per 8/12 los - spoke with [facility] . Tammi (RN) aware of appt . On 8/25

## 2018-09-22 ENCOUNTER — Encounter: Payer: Self-pay | Admitting: *Deleted

## 2018-09-22 NOTE — Progress Notes (Signed)
Oncology Nurse Navigator Documentation  Oncology Nurse Navigator Flowsheets 09/22/2018  Navigator Location [facility]  Referral Date to RadOnc/MedOnc -  Navigator Encounter Type Other/I followed up on Mr. Corey's schedule.  HIs PET scan has been authorized and I called central scheduling to schedule.  I then called patient's SNF to update on appt time, date, and pre-procedure instructions.  I spoke to his nurse and she will update transportation on the appt.   Telephone -  Patient Visit Type -  Treatment Phase Abnormal Scans  Barriers/Navigation Needs Coordination of Care;Education  Education Other  Interventions Coordination of Care;Education  Coordination of Care Appts  Education Method Verbal  Acuity Level 2  Time Spent with Patient 22

## 2018-09-26 ENCOUNTER — Other Ambulatory Visit: Payer: Self-pay | Admitting: Internal Medicine

## 2018-09-26 DIAGNOSIS — C3490 Malignant neoplasm of unspecified part of unspecified bronchus or lung: Secondary | ICD-10-CM

## 2018-09-27 ENCOUNTER — Encounter (HOSPITAL_COMMUNITY): Payer: Medicare Other

## 2018-09-28 ENCOUNTER — Encounter: Payer: Self-pay | Admitting: *Deleted

## 2018-09-28 NOTE — Progress Notes (Signed)
Oncology Nurse Navigator Documentation  Oncology Nurse Navigator Flowsheets 09/28/2018  Navigator Location [facility]  Referral Date to RadOnc/MedOnc -  Navigator Encounter Type Telephone;Other/I contacted authorization coordinator several times to get pet scan authorized.  I was notified today that scan is approved.  I called central scheduling to get scheduled and was given time, date and pre-procedure instructions.   I called SNF and updated his nurse on appt for PET and follow up here at the cancer center.  I also gave her pre-procedure instructions.    Telephone Outgoing Call  Patient Visit Type -  Treatment Phase Abnormal Scans  Barriers/Navigation Needs Coordination of Care;Education  Education Other  Interventions Coordination of Care;Education  Coordination of Care Appts;Other  Education Method Verbal  Acuity Level 3  Time Spent with Patient 60

## 2018-09-30 DIAGNOSIS — G4701 Insomnia due to medical condition: Secondary | ICD-10-CM | POA: Diagnosis not present

## 2018-10-04 ENCOUNTER — Other Ambulatory Visit: Payer: Self-pay

## 2018-10-04 ENCOUNTER — Ambulatory Visit: Payer: Medicare Other | Admitting: Internal Medicine

## 2018-10-04 ENCOUNTER — Ambulatory Visit (HOSPITAL_COMMUNITY)
Admission: RE | Admit: 2018-10-04 | Discharge: 2018-10-04 | Disposition: A | Discharge status: Home / Self Care | Payer: Medicare Other | Source: Ambulatory Visit | Attending: Internal Medicine | Admitting: Internal Medicine

## 2018-10-04 ENCOUNTER — Other Ambulatory Visit: Payer: Self-pay | Admitting: *Deleted

## 2018-10-04 DIAGNOSIS — Z79899 Other long term (current) drug therapy: Secondary | ICD-10-CM | POA: Diagnosis not present

## 2018-10-04 DIAGNOSIS — D3502 Benign neoplasm of left adrenal gland: Secondary | ICD-10-CM | POA: Diagnosis not present

## 2018-10-04 DIAGNOSIS — C3490 Malignant neoplasm of unspecified part of unspecified bronchus or lung: Secondary | ICD-10-CM | POA: Diagnosis not present

## 2018-10-04 LAB — GLUCOSE, CAPILLARY: Glucose-Capillary: 105 mg/dL — ABNORMAL HIGH (ref 70–99)

## 2018-10-04 MED ORDER — FLUDEOXYGLUCOSE F - 18 (FDG) INJECTION
10.9200 | Freq: Once | INTRAVENOUS | Status: AC | PRN
  Administered 2018-10-04: 11:00:00 10.92 via INTRAVENOUS

## 2018-10-05 ENCOUNTER — Inpatient Hospital Stay: Payer: Medicare Other | Admitting: Physician Assistant

## 2018-10-05 ENCOUNTER — Inpatient Hospital Stay: Payer: Medicare Other

## 2018-10-06 ENCOUNTER — Telehealth: Payer: Self-pay | Admitting: Physician Assistant

## 2018-10-06 NOTE — Telephone Encounter (Signed)
[employer] for patient - spoke with RN for patient and scheduled an apt per 8/26 sch message. RN unsure of when to schedule appt . Will contact scheduler for facility and give me a call back to schedule.

## 2018-10-09 IMAGING — CR DG CHEST 2V
2 series · 3 of 3 positions shown · non-contrast
Comparison: 07/17/2016

CLINICAL DATA: Weakness and dizziness.

EXAM:
CHEST  2 VIEW

[Series 2: chest lat · 0.14mm/px · 2 of 2 slices shown]
[im 1/2]
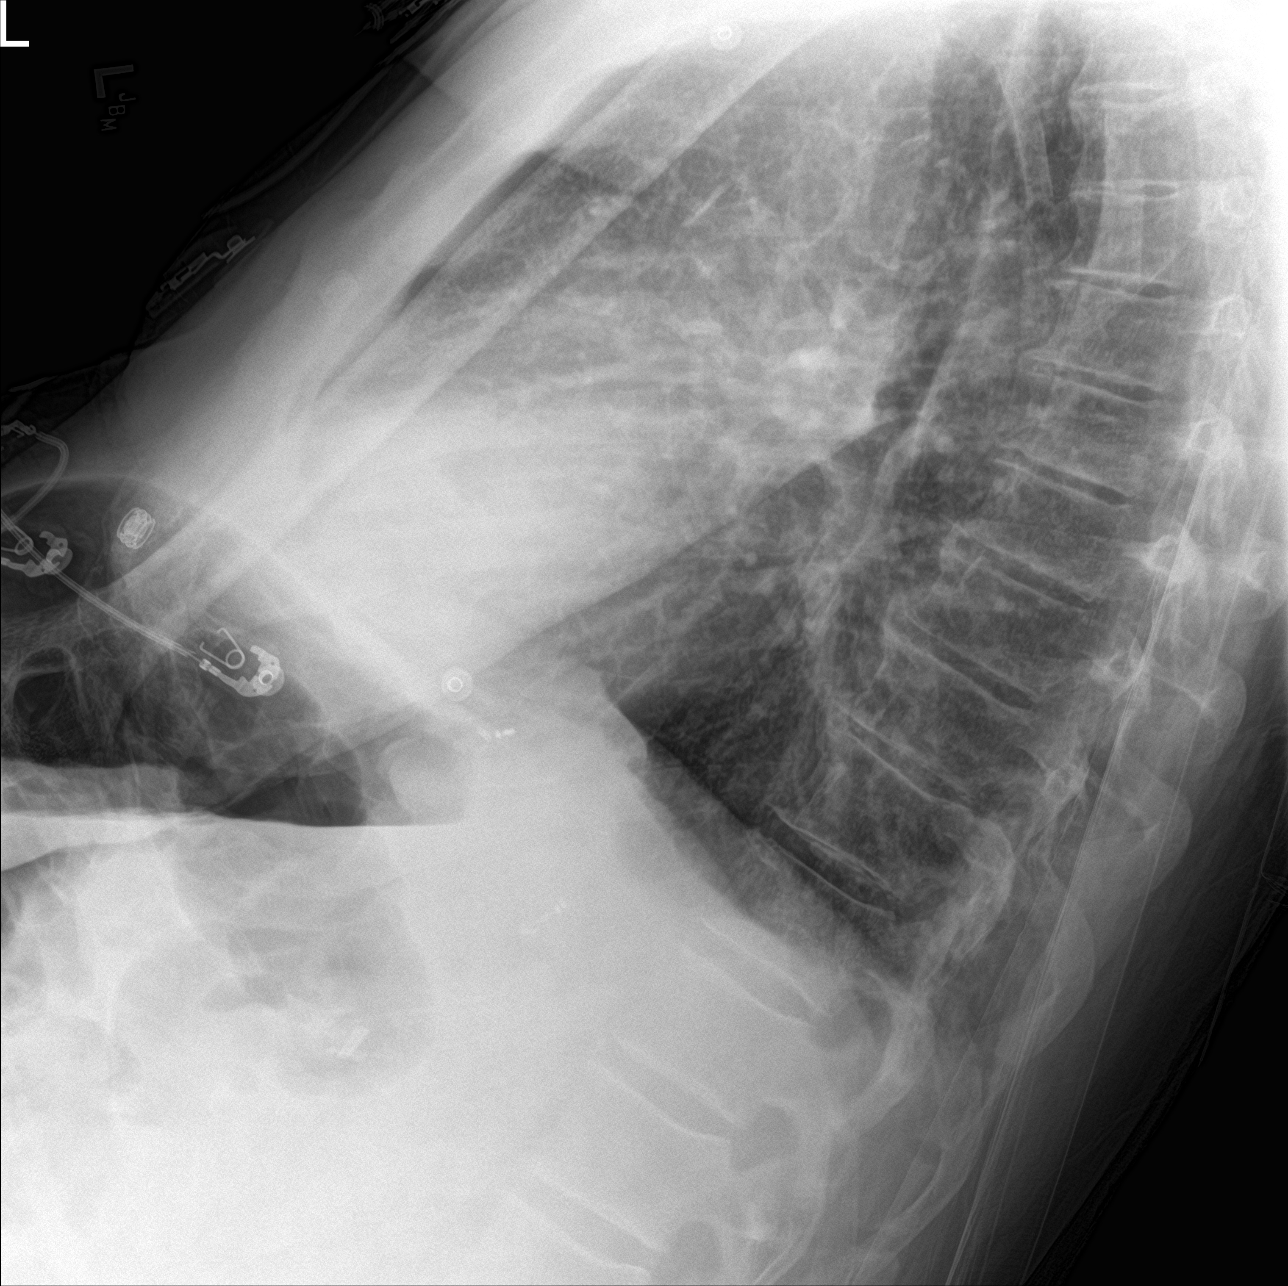
[im 2/2]
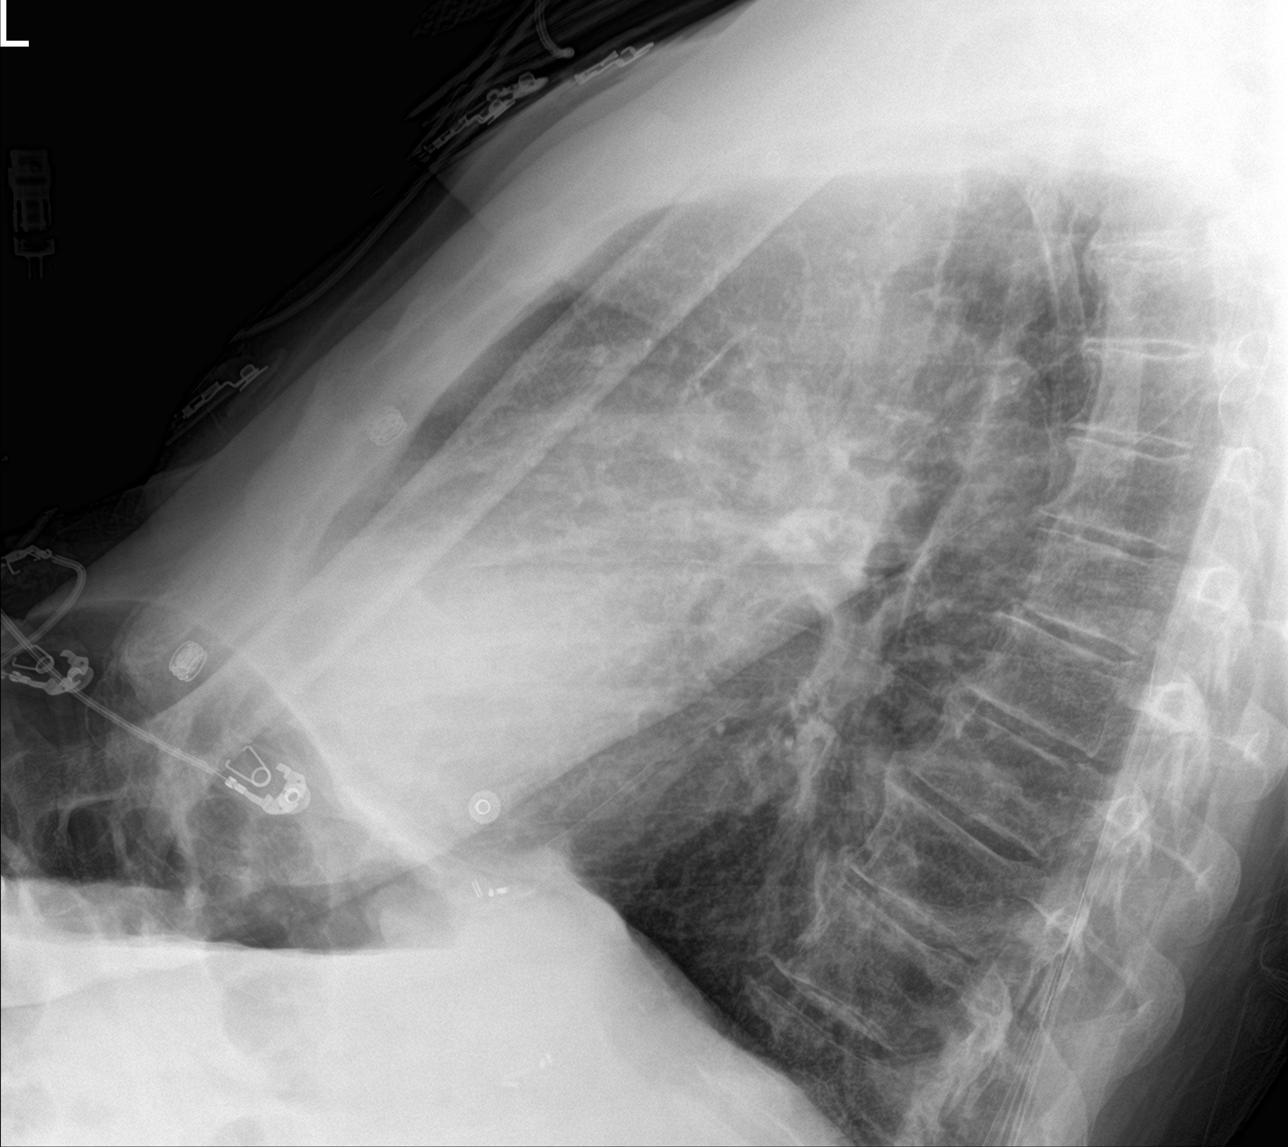

[chest ap]
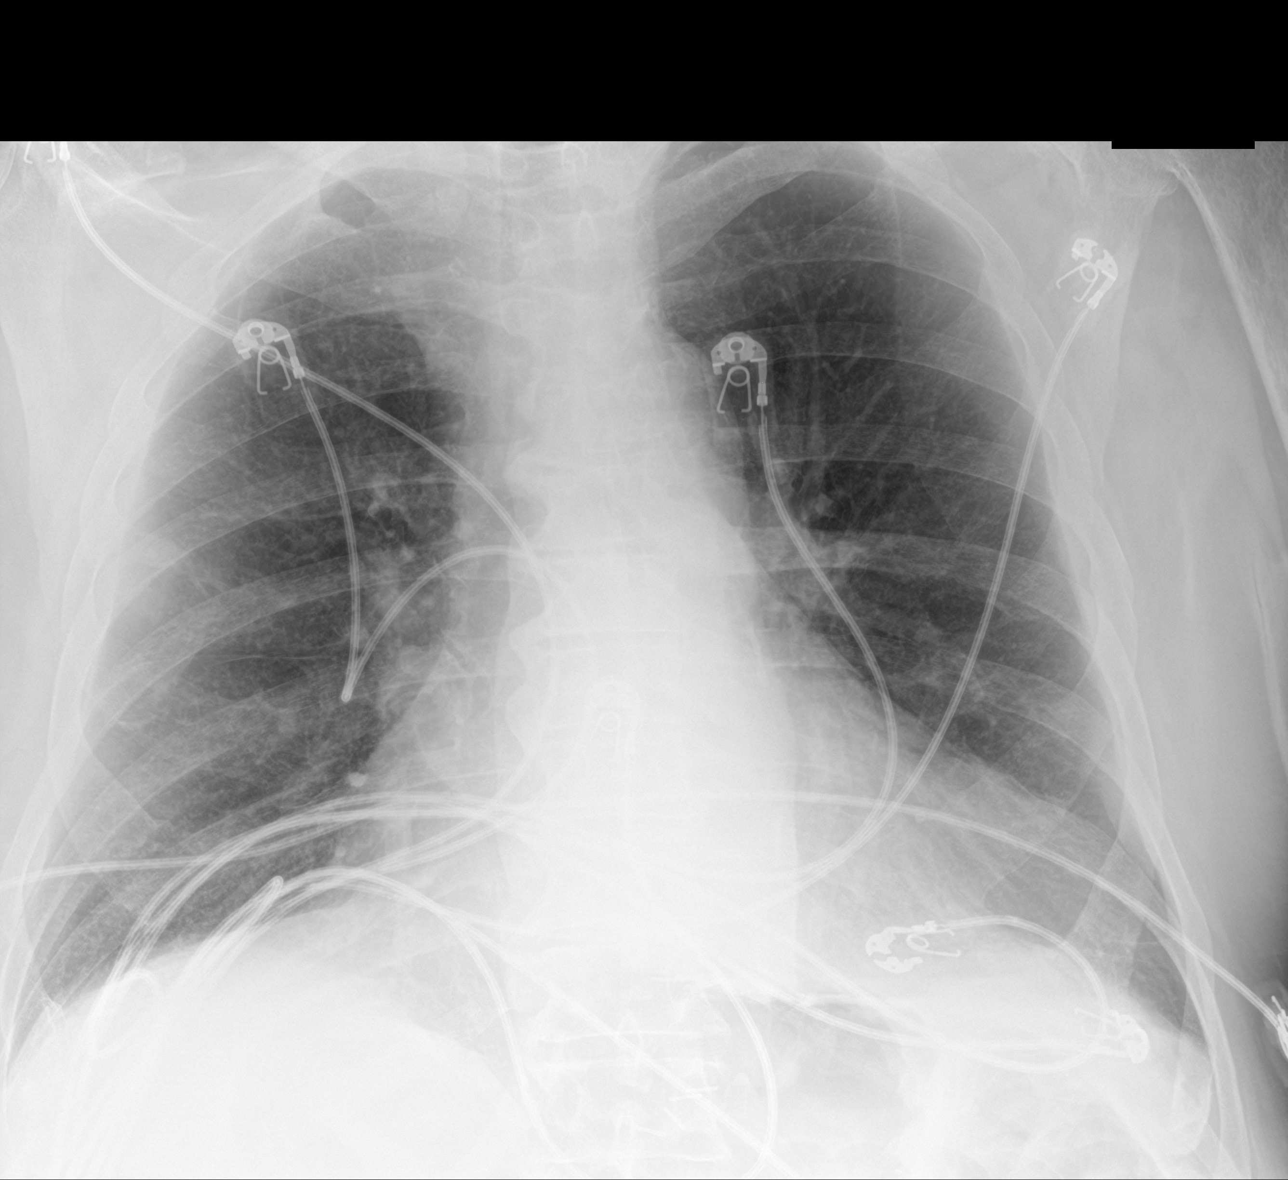

[3 of 3 positions shown; findings below may reference images not displayed]

FINDINGS: Heart size remains enlarged. Lungs are clear without pulmonary edema
or focal airspace disease. The trachea is midline. No large pleural
effusions. Patient was unable to raise the left arm due to history
of stroke. Degenerative endplate changes in the thoracic spine.
IMPRESSION: No acute chest abnormality.

Stable cardiomegaly.

## 2018-10-10 ENCOUNTER — Encounter: Payer: Self-pay | Admitting: *Deleted

## 2018-10-10 NOTE — Progress Notes (Signed)
Oncology Nurse Navigator Documentation  Oncology Nurse Navigator Flowsheets 10/10/2018  Navigator Location [facility]  Referral Date to RadOnc/MedOnc -  Navigator Encounter Type Telephone;Other  Telephone Outgoing Call/I called [facility] to update their transportation team of his appt on 10/12/2018.    Patient Visit Type -  Treatment Phase Pre-Tx/Tx Discussion  Barriers/Navigation Needs Coordination of Care;Education  Education Other  Interventions Coordination of Care;Education  Coordination of Care Other  Education Method Verbal  Acuity Level 1  Time Spent with Patient 15

## 2018-10-12 ENCOUNTER — Inpatient Hospital Stay: Payer: Medicare Other | Attending: Physician Assistant | Admitting: Physician Assistant

## 2018-10-12 ENCOUNTER — Other Ambulatory Visit: Payer: Self-pay

## 2018-10-12 ENCOUNTER — Encounter: Payer: Self-pay | Admitting: Physician Assistant

## 2018-10-12 ENCOUNTER — Other Ambulatory Visit: Payer: Medicare Other

## 2018-10-12 VITALS — BP 122/62 | HR 73 | Temp 98.5°F | Resp 18 | Ht 72.0 in

## 2018-10-12 DIAGNOSIS — R911 Solitary pulmonary nodule: Secondary | ICD-10-CM | POA: Diagnosis not present

## 2018-10-12 DIAGNOSIS — Z7189 Other specified counseling: Secondary | ICD-10-CM | POA: Insufficient documentation

## 2018-10-12 DIAGNOSIS — R918 Other nonspecific abnormal finding of lung field: Secondary | ICD-10-CM

## 2018-10-12 NOTE — Progress Notes (Signed)
[facility] OFFICE PROGRESS NOTE  Charlott Rakes, MD [place] [address]  DIAGNOSIS: Suspicious stage Ia non-small cell lung cancer presented with right upper lobe pulmonary nodule, pending tissue diagnosis and staging work-up.  PRIOR THERAPY: None  CURRENT THERAPY: None  INTERVAL HISTORY: [name] 68 y.o. male returns to the clinic for follow-up visit. The patient is a resident of [facility]. The patient is feeling fairly well today without any concerning complaints except for his dyspnea on exertion and baseline cough from his COPD. The patient states he uses an inhaler twice a day. He denies any fever, chills, night sweats, or weight loss. He denies any chest pain or hemoptysis.  He denies any nausea, vomiting, diarrhea, or constipation.  He denies any headache or visual changes.  The patient was initially seen on April 25, 2018 for evaluation of a suspicious right upper lobe pulmonary nodule.  He also had a suspicious abnormal wall thickening in the rectum and a rectal mass; therefore, metastatic disease was not excluded.  The patient was supposed to have a PET scan and a follow-up visit in 2 weeks but the patient was lost to follow-up for a several months. The patient recently had a PET scan performed.  He is here today for evaluation and to review his scan results and treatment options.  MEDICAL HISTORY: Past Medical History:  Diagnosis Date  . Acute blood loss anemia   . Acute CVA (cerebrovascular accident) ([place]) 08/27/2014  . Acute encephalopathy 11/28/2017  . Acute ischemic stroke ([place])   . AKI (acute kidney injury) ([place])   . Alcohol abuse    H/O  . Alcohol dependence with withdrawal with complication ([place]) 1/66/0630  . Anxiety   . Arterial ischemic stroke, MCA (middle cerebral artery), right, acute ([place])   . Arthritis   . Asthma   . Binocular vision disorder with diplopia 01/03/2013  . CAD (coronary artery disease)  05/27/10   Cath: severe single vessell CAD left cx midportion obtuse marginal 2 to 3.  . Cerebral infarction due to embolism of right middle cerebral artery ([place])   . Cerebral infarction due to stenosis of right carotid artery ([place]) 11/01/2014  . Cerebral thrombosis with cerebral infarction 07/18/2016  . Cerebrovascular accident (CVA) due to stenosis of right carotid artery ([place])   . CHF (congestive heart failure) ([place])   . Closed blow-out fracture of floor of orbit ([place]) 01/19/2013  . COPD (chronic obstructive pulmonary disease) ([place])   . COPD (chronic obstructive pulmonary disease) ([place])   . CVA (cerebral vascular accident) ([place]) 08/26/2014  . Dementia due to another medical condition ([place]) 07/12/2015  . Depression   . Diabetes mellitus, type 2 ([place]) 03/13/2015  . Diastolic CHF, acute on chronic (HCC) 11/01/2014  . DOE (dyspnea on exertion) 11/23/2014  . DVT (deep venous thrombosis) ([place]) 02/22/2014  . Embolic stroke ([place])   . Enophthalmos due to trauma 01/19/2013  . Eyelid lesion 01/24/2013  . Fracture of inferior orbital wall ([place]) 01/03/2013  . Fracture of orbital floor ([place]) 01/03/2013  . GERD (gastroesophageal reflux disease)   . H/O total knee replacement 10/24/2014  . HCAP (healthcare-associated pneumonia) 10/15/2015  . Headache around the eyes 08/27/2014  . History of DVT (deep vein thrombosis)   . History of DVT of lower extremity   . Hyperkalemia 10/24/2014  . Hyperlipemia   . Hyperlipidemia   . Hypertension   . Hypoalbuminemia due to protein-calorie malnutrition ([place])   . Hypotension   .  Labile blood pressure   . Left shoulder pain 09/28/2017  . Lower extremity edema 02/21/2014  . Medial orbital wall fracture 01/19/2013  . Myocardial infarction ([place]) 2000  . Orbital fracture 12/2012  . OSA on CPAP   . Pain of left eye 01/03/2013  . Peripheral vascular disease, unspecified ([place])    08/20/10 doppler: increase in right ABI post-op. Left ABI stable. S/P bi-fem bypass surgery  .  Pneumonia 04/04/2012  . Right middle cerebral artery stroke ([place]) 01/18/2018  . Seizures ([place])   . Shortness of breath   . Stroke (cerebrum) ([place]) 02/06/2015  . Thrombocytopenia ([place])   . Tobacco abuse   . Trichiasis without entropion 04/16/2013   Overview:  LLL central   . Type 2 diabetes mellitus with peripheral neuropathy ([place]) 03/13/2015  . Urinary incontinence 03/14/2015    ALLERGIES:  is allergic to darvocet [propoxyphene n-acetaminophen]; haldol [haloperidol decanoate]; acetaminophen; metformin and related; and norco [hydrocodone-acetaminophen].  MEDICATIONS:  Current Outpatient Medications  Medication Sig Dispense Refill  . acetaminophen (TYLENOL) 325 MG tablet Take 2 tablets (650 mg total) by mouth every 4 (four) hours as needed for mild pain (or temp > 37.5 C (99.5 F)).    [name] allopurinol (ZYLOPRIM) 300 MG tablet Take 1 tablet (300 mg total) by mouth daily. 30 tablet 3  . atorvastatin (LIPITOR) 80 MG tablet Take 1 tablet (80 mg total) by mouth daily at 6 PM. (Patient taking differently: Take 80 mg by mouth daily. ) 30 tablet 3  . camphor-menthol (SARNA) lotion Apply topically as needed for itching. 222 mL 0  . Cholecalciferol 25 MCG (1000 UT) tablet Take 1,000 Units by mouth daily.    . clopidogrel (PLAVIX) 75 MG tablet Take 1 tablet (75 mg total) by mouth daily. 30 tablet 3  . DULoxetine (CYMBALTA) 60 MG capsule Take 1 capsule (60 mg total) by mouth daily. 30 capsule 3  . ferrous sulfate 325 (65 FE) MG tablet Take 1 tablet (325 mg total) by mouth daily with breakfast.  3  . folic acid (FOLVITE) 1 MG tablet Take 1 tablet (1 mg total) by mouth daily.    [name] guaiFENesin (MUCINEX) 600 MG 12 hr tablet Take 1,200 mg by mouth 2 (two) times daily.    . hydrocortisone 2.5 % cream Apply 1 application topically every 6 (six) hours as needed (itchiness).    . Infant Care Products (DERMACLOUD) CREA Apply 1 application topically 2 (two) times daily. Apply to buttocks    . levETIRAcetam (KEPPRA) 500  MG tablet Take 1 tablet (500 mg total) by mouth 2 (two) times daily. 60 tablet 3  . losartan (COZAAR) 25 MG tablet Take 1 tablet (25 mg total) by mouth daily. 30 tablet 2  . Melatonin 5 MG TABS Take 5 mg by mouth at bedtime.    . memantine (NAMENDA) 10 MG tablet Take 1 tablet (10 mg total) by mouth 2 (two) times daily. 6 tablet 0  . mometasone-formoterol (DULERA) 200-5 MCG/ACT AERO Inhale 2 puffs into the lungs 2 (two) times daily.    . pantoprazole (PROTONIX) 40 MG tablet Take 1 tablet (40 mg total) by mouth daily. 30 tablet 3  . polyethylene glycol (MIRALAX / GLYCOLAX) 17 g packet Take 17 g by mouth daily as needed for moderate constipation.    . pregabalin (LYRICA) 150 MG capsule Take 1 capsule (150 mg total) by mouth 2 (two) times daily. 180 capsule 0  . Skin Protectants, Misc. (EUCERIN) cream Apply 1 application topically daily.    [name]  tiZANidine (ZANAFLEX) 2 MG tablet Take 2 mg by mouth 3 (three) times daily.    . traZODone (DESYREL) 100 MG tablet Take 100 mg by mouth at bedtime.    Grant Ruts INHUB 250-50 MCG/DOSE AEPB Inhale 1-2 puffs into the lungs daily. 1 each 0   No current facility-administered medications for this visit.     SURGICAL HISTORY:  Past Surgical History:  Procedure Laterality Date  . abdoninal ao angio & bifem angio  05/27/10   Patent graft, occluded bil stents with no retrograde flow into the hypogastric arteries. 100% occl left ant. tibial artery, 70% to 80% to 100% stenosis right superficial fem artery above adductor canal. 100 % occl right ant. tibial vessell  . Aortogram w/ PTCA  09/292003  . BACK SURGERY    . BIOPSY  08/26/2018   Procedure: BIOPSY;  Surgeon: Lavena Bullion, DO;  Location: [facility];  Service: Gastroenterology;;  . CARDIAC CATHETERIZATION  05/27/10   severe CAD left cx  . CHOLECYSTECTOMY    . ESOPHAGOGASTRIC FUNDOPLICATION    . ESOPHAGOGASTRODUODENOSCOPY (EGD) WITH PROPOFOL N/A 08/26/2018   Procedure: ESOPHAGOGASTRODUODENOSCOPY (EGD) WITH  PROPOFOL;  Surgeon: Lavena Bullion, DO;  Location: [facility];  Service: Gastroenterology;  Laterality: N/A;  . EYE SURGERY    . FEMORAL BYPASS  08/19/10   Right Fem-Pop  . FLEXIBLE SIGMOIDOSCOPY N/A 08/26/2018   Procedure: FLEXIBLE SIGMOIDOSCOPY;  Surgeon: Lavena Bullion, DO;  Location: [facility];  Service: Gastroenterology;  Laterality: N/A;  . HOT HEMOSTASIS N/A 08/26/2018   Procedure: HOT HEMOSTASIS (ARGON PLASMA COAGULATION/BICAP);  Surgeon: Lavena Bullion, DO;  Location[facility]COPY;  Service: Gastroenterology;  Laterality: N/A;  . IR ANGIO INTRA EXTRACRAN SEL COM CAROTID INNOMINATE BILAT MOD SED  07/20/2016  . IR ANGIO INTRA EXTRACRAN SEL COM CAROTID INNOMINATE BILAT MOD SED  01/14/2018  . IR ANGIO VERTEBRAL SEL SUBCLAVIAN INNOMINATE UNI R MOD SED  07/20/2016  . IR ANGIO VERTEBRAL SEL VERTEBRAL BILAT MOD SED  01/14/2018  . IR ANGIO VERTEBRAL SEL VERTEBRAL UNI L MOD SED  07/20/2016  . IR RADIOLOGIST EVAL & MGMT  05/27/2016  . IR US GUIDE VASC ACCESS RIGHT  01/14/2018  . JOINT REPLACEMENT    . PERIPHERAL VASCULAR CATHETERIZATION N/A 12/10/2014   Procedure: Abdominal Aortogram;  Surgeon: Angelia Mould, MD;  Location: [facility] CV LAB;  Service: Cardiovascular;  Laterality: N/A;  . POLYPECTOMY  08/26/2018   Procedure: POLYPECTOMY;  Surgeon: Lavena Bullion, DO;  Location: [facility];  Service: Gastroenterology;;  . RADIOLOGY WITH ANESTHESIA N/A 02/08/2015   Procedure: RADIOLOGY WITH ANESTHESIA;  Surgeon: Luanne Bras, MD;  Location: [facility];  Service: Radiology;  Laterality: N/A;  . TEE WITHOUT CARDIOVERSION N/A 08/29/2014   Procedure: TRANSESOPHAGEAL ECHOCARDIOGRAM (TEE);  Surgeon: Josue Hector, MD;  Location: [facility] ENDOSCOPY;  Service: Cardiovascular;  Laterality: N/A;  . TOTAL KNEE ARTHROPLASTY      REVIEW OF SYSTEMS:   Review of Systems  Constitutional: Positive for generalized weakness. Negative for appetite change, chills, fatigue, fever and unexpected  weight change.  HENT: Negative for mouth sores, nosebleeds, sore throat and trouble swallowing.   Eyes: Negative for eye problems and icterus.  Respiratory: Positive for baseline productive cough, wheezing, and dyspnea on exertion. Negative for hemoptysis Cardiovascular: Negative for chest pain and leg swelling.  Gastrointestinal: Negative for abdominal pain, constipation, diarrhea, nausea and vomiting.  Genitourinary: Negative for bladder incontinence, difficulty urinating, dysuria, frequency and hematuria.   Musculoskeletal: Patient unable to ambulate and  uses a lift for transfer. Negative for back pain, neck pain and neck stiffness.  Skin: Negative for itching and rash.  Neurological: Negative for dizziness, headaches, light-headedness and seizures.  Hematological: Negative for adenopathy. Does not bruise/bleed easily.  Psychiatric/Behavioral: Negative for confusion, depression and sleep disturbance. The patient is not nervous/anxious.     PHYSICAL EXAMINATION:  Blood pressure 122/62, pulse 73, temperature 98.5 F (36.9 C), resp. rate 18, height 6' (1.829 m), SpO2 96 %.  ECOG PERFORMANCE STATUS: 2 - Symptomatic, <50% confined to bed  Physical Exam  Constitutional: Oriented to person, place, and time and chronically ill appearing male and in no distress. Examined in the wheelchair. HENT:  Head: Normocephalic and atraumatic.  Mouth/Throat: Oropharynx is clear and moist. No oropharyngeal exudate.  Eyes: Conjunctivae are normal. Right eye exhibits no discharge. Left eye exhibits no discharge. No scleral icterus.  Neck: Normal range of motion. Neck supple.  Cardiovascular: Normal rate, regular rhythm, normal heart sounds and intact distal pulses.   Pulmonary/Chest: Wheezing noted in all lung fields. Effort normal. No respiratory distress. No rales.  Abdominal: Soft. Bowel sounds are normal. Exhibits no distension and no mass. There is no tenderness.  Musculoskeletal: Normal range of  motion. Exhibits no edema.  Lymphadenopathy:    No cervical adenopathy.  Neurological: Alert and oriented to person, place, and time. Skin: Skin is warm and dry. No rash noted. Not diaphoretic. No erythema. No pallor.  Psychiatric: Mood, memory and judgment normal.  Vitals reviewed.  LABORATORY DATA: Lab Results  Component Value Date   WBC 9.3 08/26/2018   HGB 9.9 (L) 08/26/2018   HCT 35.5 (L) 08/26/2018   MCV 81.1 08/26/2018   PLT 181 08/26/2018      Chemistry      Component Value Date/Time   NA 148 (H) 08/26/2018 0626   NA 145 (H) 08/19/2017 0952   K 3.1 (L) 08/26/2018 0626   CL 114 (H) 08/26/2018 0626   CO2 27 08/26/2018 0626   BUN <5 (L) 08/26/2018 0626   BUN 14 08/19/2017 0952   CREATININE 0.61 08/26/2018 0626   CREATININE 0.83 04/25/2018 1354   CREATININE CANCELED 04/28/2016 1515      Component Value Date/Time   CALCIUM 9.4 08/26/2018 0626   ALKPHOS 67 08/22/2018 1430   AST 15 08/22/2018 1430   AST 17 04/25/2018 1354   ALT 11 08/22/2018 1430   ALT 17 04/25/2018 1354   BILITOT 0.8 08/22/2018 1430   BILITOT 0.5 04/25/2018 1354       RADIOGRAPHIC STUDIES:  Nm Pet Image Initial (pi) Skull Base To Thigh  Result Date: 10/04/2018 CLINICAL DATA:  Initial treatment strategy for non-small cell lung cancer. EXAM: NUCLEAR MEDICINE PET SKULL BASE TO THIGH TECHNIQUE: 10.9 mCi F-18 FDG was injected intravenously. Full-ring PET imaging was performed from the skull base to thigh after the radiotracer. CT data was obtained and used for attenuation correction and anatomic localization. Fasting blood glucose: 105 mg/dl COMPARISON:  CT chest abdomen pelvis dated 08/23/2018 FINDINGS: Mediastinal blood pool activity: SUV max 3.2 Liver activity: SUV max NA NECK: No hypermetabolic cervical lymphadenopathy. Incidental CT findings: none CHEST: 21 x 13 mm right upper lobe pulmonary nodule (series 8/image 29), grossly unchanged, max SUV 10.0. This appearance is compatible with primary  bronchogenic neoplasm. No hypermetabolic thoracic lymphadenopathy. Incidental CT findings: Atherosclerotic calcifications of the aortic arch. Three vessel coronary atherosclerosis. ABDOMEN/PELVIS: No hypermetabolic rectal mass. No abnormal metabolism in the liver, spleen, pancreas, or right adrenal gland. 12 mm  left adrenal adenoma demonstrates mild hypermetabolism, max SUV 3.5. No hypermetabolic abdominopelvic lymphadenopathy. Incidental CT findings: Status post cholecystectomy. Postsurgical changes related to prior Nissen fundoplication. Atherosclerotic calcifications the abdominal aorta and branch vessels. Aorto bi-iliac grafts. SKELETON: No focal hypermetabolic activity to suggest skeletal metastasis. Incidental CT findings: Degenerative changes of the visualized thoracolumbar spine. IMPRESSION: Hypermetabolic 2.1 cm right upper lobe pulmonary nodule, compatible primary bronchogenic neoplasm. No findings suspicious for metastatic disease. No evidence of hypermetabolic rectal mass. Stable left adrenal adenoma, benign. Electronically Signed   By: Julian Hy M.D.   On: 10/04/2018 14:47     ASSESSMENT/PLAN:  This is a very pleasant 68 year old Caucasian male with likely stage Ia non-small cell lung cancer. He presented with a right upper lobe lung pulmonary nodule pending tissue diagnosis in March 2020  The patient recently had a PET scan performed.  Dr. Julien Nordmann personally and independently reviewed the scan and discussed the results with the patient today.  The scan showed a hypermetabolic 2.1 cm right upper lobe pulmonary nodule. There is no evidence of metastatic disease or thoracic lymphadenpathy. There is no evidence of hypermetabolic activiity near the rectal mass.   Dr. Julien Nordmann recommends that the patient undergo a CT-guided core biopsy for tissue diagnosis followed by radiation treatment to the lung mass. The patient is not a good surgical candidate for resection.   I will arrange for the  patient to have a CT guided biopsy and I have placed a referral to radiation oncology.   We will see the patient back for a follow up visit in 6 months with a restaging CT scan of the chest to assess his response to treatment.   The patient was advised to call immediately if she has any concerning symptoms in the interval. The patient voices understanding of current disease status and treatment options and is in agreement with the current care plan. All questions were answered. The patient knows to call the clinic with any problems, questions or concerns. We can certainly see the patient much sooner if necessary  Orders Placed This Encounter  Procedures  . CT Chest W Contrast    Standing Status:   Future    Standing Expiration Date:   10/12/2019    Order Specific Question:   ** REASON FOR EXAM (FREE TEXT)    Answer:   Restaging Lung cancer    Order Specific Question:   If indicated for the ordered procedure, I authorize the administration of contrast media per Radiology protocol    Answer:   Yes    Order Specific Question:   Preferred imaging location?    Answer:   [facility]    Order Specific Question:   Radiology Contrast Protocol - do NOT remove file path    Answer:   [email or web address]  . CMP ([place] only)    Standing Status:   Future    Standing Expiration Date:   10/12/2019  . CBC with Differential (Cancer Center Only)    Standing Status:   Future    Standing Expiration Date:   10/12/2019  . Ambulatory referral to Radiation Oncology    Referral Priority:   Routine    Referral Type:   Consultation    Referral Reason:   Specialty Services Required    Requested Specialty:   Radiation Oncology    Number of Visits Requested:   [place], PA-C 10/12/18  ADDENDUM: Hematology/Oncology Attending: I had a face-to-face encounter with the patient  today.  I recommended his care plan.  This is a very pleasant 68 years old  white male with highly suspicious stage Ia lung cancer pending tissue diagnosis.  The patient had a PET scan performed recently.  I personally and independently reviewed the PET scan images and discussed the results with the patient today. I recommended for the patient to proceed with CT-guided core biopsy of the right lung mass for confirmation of the tissue diagnosis. Once the tissue diagnosis is confirmed, we will refer the patient to radiation oncology for evaluation and consideration of SBRT since the patient is not a good surgical candidate for resection. I will see the patient back for follow-up visit in 6 months for evaluation with repeat CT scan of the chest for restaging of his disease. The patient was advised to call immediately if he has any concerning symptoms in the interval.  Disclaimer: This note was dictated with voice recognition software. Similar sounding words can inadvertently be transcribed and may be missed upon review. Eilleen Kempf, MD 10/12/18

## 2018-10-12 NOTE — Patient Instructions (Addendum)
-  There is just a single lung mass in the right upper lobe. He will need a biopsy of this to make sure that it is cancer before he undergoes treatment. Please be on the lookout for a phone call from radiology to schedule the biopsy -His treatment will be radiation. I will send a referral to the radiation oncology department. They will discuss treatment to the right upper lobe mass -WE will schedule a follow up appointment in 6 months with Dr. Julien Nordmann, medical oncology, to see how the spot responded to treatment. He will get a repeat CT scan of the chest in 6 months a few days before that appointment.

## 2018-10-14 ENCOUNTER — Encounter: Payer: Self-pay | Admitting: *Deleted

## 2018-10-14 NOTE — Progress Notes (Signed)
Oncology Nurse Navigator Documentation  Oncology Nurse Navigator Flowsheets 10/14/2018  Navigator Location [facility]  Referral Date to RadOnc/MedOnc -  Navigator Encounter Type Other/I called central scheduling today to see if I could get CT biopsy scheduled.  I was updated that bx is in review for radiologist to look at and they will call and schedule after review.  I will update Dr. Julien Nordmann and [facility] PA.  Telephone -  Patient Visit Type -  Treatment Phase Abnormal Scans  Barriers/Navigation Needs Coordination of Care  Education Other  Interventions Coordination of Care  Coordination of Care Other  Education Method -  Acuity Level 2  Time Spent with Patient 30

## 2018-10-18 ENCOUNTER — Encounter: Payer: Self-pay | Admitting: *Deleted

## 2018-10-18 NOTE — Progress Notes (Signed)
Oncology Nurse Navigator Documentation  Oncology Nurse Navigator Flowsheets 10/18/2018  Navigator Location [facility]  Referral Date to RadOnc/MedOnc -  Navigator Encounter Type Other/I followed up on Mr. Brindle's ct bx and this has not been scheduled.  I called central scheduling.  I was updated that the person that schedules this will call me back.    Telephone -  Patient Visit Type -  Treatment Phase Abnormal Scans  Barriers/Navigation Needs Coordination of Care  Education Other  Interventions Coordination of Care  Coordination of Care Other  Education Method -  Acuity Level 1  Time Spent with Patient 15

## 2018-10-19 NOTE — Progress Notes (Signed)
Thoracic Location of Tumor / Histology: Suspicious stage Ia non-small cell lung cancer presented with right upper lobe pulmonary nodule, pending tissue diagnosis and staging work-up.  Patient presented with symptoms of:  The patient was admitted to [facility] in February 2020 with left-sided arm and leg weakness as well as left facial droop and slurred speech after smoking crack cocaine.  During his evaluation he had chest x-ray performed on 03/27/2018 and that showed 1.7 cm right mid lung nodule.  He has a previous chest x-ray on January 09, 2018 that showed 0.9 cm right upper lobe nodule.  On 03/28/2018 the patient had CT scanning of the chest and it showed a lobulated 2.0 cm right upper lobe pulmonary nodule. The patient was initially seen on April 25, 2018 for evaluation of a suspicious right upper lobe pulmonary nodule.  He also had a suspicious abnormal wall thickening in the rectum and a rectal mass; therefore, metastatic disease was not excluded.  The patient was supposed to have a PET scan and a follow-up visit in 2 weeks but the patient was lost to follow-up for a several months. The patient recently had a PET scan performed.   Biopsies revealed: CT biopsy scheduled for 10/27/18  Tobacco/Marijuana/Snuff/ETOH use: As of 04/25/18:  Tobacco Use  . Smoking status: Current Every Day Smoker    Packs/day: 0.50    Years: 50.00    Pack years: 25.00    Types: Cigars, Cigarettes    Start date: 02/09/1961  . Smokeless tobacco: Former Systems developer    Types: Chew  . Tobacco comment: 2 ppd, full flavor  Substance Use Topics  . Alcohol use: Yes    Comment: occasionally, "I might drink 1 every 6 months"  . Drug use: Yes    Types: Cocaine    Comment: "I smoke crack every once in a while"; last smoked yesterday   9/110/20: Pt reports last cigarette was 03/25/18 and denies alcohol or drug use.   Past/Anticipated interventions by cardiothoracic surgery, if any: The patient is not a good  surgical candidate for resection.  Past/Anticipated interventions by medical oncology, if any: Per Dr. Julien Nordmann 10/12/18: ASSESSMENT/PLAN:  This is a very pleasant 68 year old Caucasian male with likely stage Ia non-small cell lung cancer. He presented with a right upper lobe lung pulmonary nodule pending tissue diagnosis in March 2020  The patient recently had a PET scan performed.  Dr. Julien Nordmann personally and independently reviewed the scan and discussed the results with the patient today.  The scan showed a hypermetabolic 2.1 cm right upper lobe pulmonary nodule. There is no evidence of metastatic disease or thoracic lymphadenpathy. There is no evidence of hypermetabolic activiity near the rectal mass.   Dr. Julien Nordmann recommends that the patient undergo a CT-guided core biopsy for tissue diagnosis followed by radiation treatment to the lung mass. The patient is not a good surgical candidate for resection.   I will arrange for the patient to have a CT guided biopsy and I have placed a referral to radiation oncology.   We will see the patient back for a follow up visit in 6 months with a restaging CT scan of the chest to assess his response to treatment.   Signs/Symptoms  Weight changes, if any: unknown The patient is feeling fairly well today without any concerning complaints except for his dyspnea on exertion and baseline cough from his COPD. The patient states he uses an inhaler twice a day. He denies any fever, chills, night sweats, or weight  loss. He denies any chest pain or hemoptysis.  He denies any nausea, vomiting, diarrhea, or constipation.  He denies any headache or visual changes.  Pain issues, if any:  Pt denies c/o pain.  SAFETY ISSUES:  Prior radiation? No  Pacemaker/ICD? No  Possible current pregnancy? N/A  Is the patient on methotrexate? No  Current Complaints / other details:  Pt presents today for initial consult with Dr. Sondra Come for Radiation Oncology. Pt is from [facility]  facility, does not have a medication list with him.  BP 100/64 (BP Location: Right Arm)   Pulse 85   Temp 98.9 F (37.2 C) (Temporal)   Resp (!) 22   SpO2 96%   Wt Readings from Last 3 Encounters:  08/22/18 218 lb 14.7 oz (99.3 kg)  03/25/18 223 lb 12.3 oz (101.5 kg)  02/04/18 216 lb 7.9 oz (98.2 kg)   Loma Sousa, RN BSN

## 2018-10-20 ENCOUNTER — Other Ambulatory Visit: Payer: Self-pay

## 2018-10-20 ENCOUNTER — Ambulatory Visit
Admission: RE | Admit: 2018-10-20 | Discharge: 2018-10-20 | Disposition: A | Discharge status: Home / Self Care | Payer: Medicare Other | Source: Ambulatory Visit | Attending: Radiation Oncology | Admitting: Radiation Oncology

## 2018-10-20 ENCOUNTER — Encounter: Payer: Self-pay | Admitting: Radiation Oncology

## 2018-10-20 VITALS — BP 100/64 | HR 85 | Temp 98.9°F | Resp 22

## 2018-10-20 DIAGNOSIS — Z79899 Other long term (current) drug therapy: Secondary | ICD-10-CM | POA: Insufficient documentation

## 2018-10-20 DIAGNOSIS — I2510 Atherosclerotic heart disease of native coronary artery without angina pectoris: Secondary | ICD-10-CM | POA: Insufficient documentation

## 2018-10-20 DIAGNOSIS — F0390 Unspecified dementia without behavioral disturbance: Secondary | ICD-10-CM | POA: Insufficient documentation

## 2018-10-20 DIAGNOSIS — Z8673 Personal history of transient ischemic attack (TIA), and cerebral infarction without residual deficits: Secondary | ICD-10-CM | POA: Diagnosis not present

## 2018-10-20 DIAGNOSIS — J449 Chronic obstructive pulmonary disease, unspecified: Secondary | ICD-10-CM | POA: Insufficient documentation

## 2018-10-20 DIAGNOSIS — E785 Hyperlipidemia, unspecified: Secondary | ICD-10-CM | POA: Diagnosis not present

## 2018-10-20 DIAGNOSIS — E1140 Type 2 diabetes mellitus with diabetic neuropathy, unspecified: Secondary | ICD-10-CM | POA: Insufficient documentation

## 2018-10-20 DIAGNOSIS — E119 Type 2 diabetes mellitus without complications: Secondary | ICD-10-CM | POA: Insufficient documentation

## 2018-10-20 DIAGNOSIS — I5032 Chronic diastolic (congestive) heart failure: Secondary | ICD-10-CM | POA: Diagnosis not present

## 2018-10-20 DIAGNOSIS — I252 Old myocardial infarction: Secondary | ICD-10-CM | POA: Insufficient documentation

## 2018-10-20 DIAGNOSIS — Z86718 Personal history of other venous thrombosis and embolism: Secondary | ICD-10-CM | POA: Insufficient documentation

## 2018-10-20 DIAGNOSIS — R911 Solitary pulmonary nodule: Secondary | ICD-10-CM | POA: Diagnosis not present

## 2018-10-20 DIAGNOSIS — K219 Gastro-esophageal reflux disease without esophagitis: Secondary | ICD-10-CM | POA: Diagnosis not present

## 2018-10-20 DIAGNOSIS — F17210 Nicotine dependence, cigarettes, uncomplicated: Secondary | ICD-10-CM | POA: Insufficient documentation

## 2018-10-20 DIAGNOSIS — D3502 Benign neoplasm of left adrenal gland: Secondary | ICD-10-CM | POA: Diagnosis not present

## 2018-10-20 DIAGNOSIS — I110 Hypertensive heart disease with heart failure: Secondary | ICD-10-CM | POA: Diagnosis not present

## 2018-10-20 DIAGNOSIS — I739 Peripheral vascular disease, unspecified: Secondary | ICD-10-CM | POA: Insufficient documentation

## 2018-10-20 DIAGNOSIS — F418 Other specified anxiety disorders: Secondary | ICD-10-CM | POA: Diagnosis not present

## 2018-10-20 NOTE — Patient Instructions (Signed)
Coronavirus (COVID-19) Are you at risk?  Are you at risk for the Coronavirus (COVID-19)?  To be considered HIGH RISK for Coronavirus (COVID-19), you have to meet the following criteria:  . Traveled to [place], [place], [place], [place] or [place]; or in the [place] to [place], [place], [place], or [place]; and have fever, cough, and shortness of breath within the last 2 weeks of travel OR . Been in close contact with a person diagnosed with COVID-19 within the last 2 weeks and have fever, cough, and shortness of breath . IF YOU DO NOT MEET THESE CRITERIA, YOU ARE CONSIDERED LOW RISK FOR COVID-19.  What to do if you are HIGH RISK for COVID-19?  . If you are having a medical emergency, call 911. . Seek medical care right away. Before you go to a doctor's office, urgent care or emergency department, call ahead and tell them about your recent travel, contact with someone diagnosed with COVID-19, and your symptoms. You should receive instructions from your physician's office regarding next steps of care.  . When you arrive at healthcare provider, tell the healthcare staff immediately you have returned from visiting [place], [place], [place], [place] or [place]; or traveled in the [place] to [place], [place], [place], or [place]; in the last two weeks or you have been in close contact with a person diagnosed with COVID-19 in the last 2 weeks.   . Tell the health care staff about your symptoms: fever, cough and shortness of breath. . After you have been seen by a medical provider, you will be either: o Tested for (COVID-19) and discharged home on quarantine except to seek medical care if symptoms worsen, and asked to  - Stay home and avoid contact with others until you get your results (4-5 days)  - Avoid travel on public transportation if possible (such as bus, train, or airplane) or o Sent to the Emergency Department by EMS for evaluation, COVID-19 testing, and possible  admission depending on your condition and test results.  What to do if you are LOW RISK for COVID-19?  Reduce your risk of any infection by using the same precautions used for avoiding the common cold or flu:  . Wash your hands often with soap and warm water for at least 20 seconds.  If soap and water are not readily available, use an alcohol-based hand sanitizer with at least 60% alcohol.  . If coughing or sneezing, cover your mouth and nose by coughing or sneezing into the elbow areas of your shirt or coat, into a tissue or into your sleeve (not your hands). . Avoid shaking hands with others and consider head nods or verbal greetings only. . Avoid touching your eyes, nose, or mouth with unwashed hands.  . Avoid close contact with people who are sick. . Avoid places or events with large numbers of people in one location, like concerts or sporting events. . Carefully consider travel plans you have or are making. . If you are planning any travel outside or inside the [place], visit the CDC's Travelers' Health webpage for the latest health notices. . If you have some symptoms but not all symptoms, continue to monitor at home and seek medical attention if your symptoms worsen. . If you are having a medical emergency, call 911.   ADDITIONAL HEALTHCARE OPTIONS FOR PATIENTS  [facility] / e-Visit: [email or web address]         [facility]: [phone]  [facility]: [phone]                   [facility]: [phone]   

## 2018-10-20 NOTE — Progress Notes (Signed)
Radiation Oncology         [phone] ________________________________  Initial Outpatient Consultation  Name: [name] MRN: [id number]  Date: 10/20/2018  DOB: 01-20-1951  WU:[id number], Kenneth Ferretti, MD  Heilingoetter, Cassandr*   REFERRING PHYSICIAN: Heilingoetter, Cassandr*  DIAGNOSIS: The encounter diagnosis was Lung nodule, solitary.   Suspicious stage Ia non-small cell lung cancer presented with right upper lobe pulmonary nodule, pending tissue diagnosis and staging work-up.  HISTORY OF PRESENT ILLNESS::[name] is a 68 y.o. male who is accompanied by no one due to COVID-19 restrictions. The patient presented to the ED on 03/25/2018 with left arm and leg weakness, left facial droop, and slurred speech after smoking crack cocaine. Chest x-ray performed on 03/27/2018 revealed a 17 mm nodule in the right mid lung. He proceeded to chest CT the following day, which showed: a lobulated 2 cm pulmonary nodule in the right upper lobe suspicious for malignancy.  He was referred to Dr. Julien Rangel on 04/25/2018., who recommended PET scan and biopsy. Unfortunately, the patient cancelled PET scan and his follow up appointment with Dr. Julien Rangel. He was lost to follow up until he presented back to the ED on 08/22/2018 for low hemoglobin, right facial droop, and slurred speech.  Repeat chest CT performed on 08/23/2018 showed: stable right upper lobe nodule, measuring 1.9 cm; abnormal wall thickening in the rectum, metastatic disease not excluded.  He was referred back to Dr. Julien Rangel on 09/21/2018. He proceeded to PET scan on 10/04/2018, which showed: hypermetabolic 2.1 cm right upper lobe pulmonary nodule; no findings suspicious for metastatic disease; no evidence of hypermetabolic rectal mass.  The patient is scheduled for CT biopsy on 10/27/2018.  PREVIOUS RADIATION THERAPY: No  PAST MEDICAL HISTORY:  Past Medical History:  Diagnosis Date   Acute blood loss anemia    Acute CVA  (cerebrovascular accident) ([place]) 08/27/2014   Acute encephalopathy 11/28/2017   Acute ischemic stroke (HCC)    AKI (acute kidney injury) ([place])    Alcohol abuse    H/O   Alcohol dependence with withdrawal with complication ([place]) 4/78/2956   Anxiety    Arterial ischemic stroke, MCA (middle cerebral artery), right, acute (HCC)    Arthritis    Asthma    Binocular vision disorder with diplopia 01/03/2013   CAD (coronary artery disease) 05/27/10   Cath: severe single vessell CAD left cx midportion obtuse marginal 2 to 3.   Cerebral infarction due to embolism of right middle cerebral artery (HCC)    Cerebral infarction due to stenosis of right carotid artery ([place]) 11/01/2014   Cerebral thrombosis with cerebral infarction 07/18/2016   Cerebrovascular accident (CVA) due to stenosis of right carotid artery (HCC)    CHF (congestive heart failure) ([place])    Closed blow-out fracture of floor of orbit ([place]) 01/19/2013   COPD (chronic obstructive pulmonary disease) (HCC)    COPD (chronic obstructive pulmonary disease) (HCC)    CVA (cerebral vascular accident) ([place]) 08/26/2014   Dementia due to another medical condition ([place]) 07/12/2015   Depression    Diabetes mellitus, type 2 ([place]) 03/12/3084   Diastolic CHF, acute on chronic ([place]) 11/01/2014   DOE (dyspnea on exertion) 11/23/2014   DVT (deep venous thrombosis) ([place]) 5/78/4696   Embolic stroke ([place])    Enophthalmos due to trauma 01/19/2013   Eyelid lesion 01/24/2013   Fracture of inferior orbital wall ([place]) 01/03/2013   Fracture of orbital floor ([place]) 01/03/2013   GERD (gastroesophageal reflux disease)    H/O total knee replacement  10/24/2014   HCAP (healthcare-associated pneumonia) 10/15/2015   Headache around the eyes 08/27/2014   History of DVT (deep vein thrombosis)    History of DVT of lower extremity    Hyperkalemia 10/24/2014   Hyperlipemia    Hyperlipidemia    Hypertension    Hypoalbuminemia due to  protein-calorie malnutrition ([place])    Hypotension    Labile blood pressure    Left shoulder pain 09/28/2017   Lower extremity edema 02/21/2014   Medial orbital wall fracture 01/19/2013   Myocardial infarction [facility]) 2000   Orbital fracture 12/2012   OSA on CPAP    Pain of left eye 01/03/2013   Peripheral vascular disease, unspecified ([place])    08/20/10 doppler: increase in right ABI post-op. Left ABI stable. S/P bi-fem bypass surgery   Pneumonia 04/04/2012   Right middle cerebral artery stroke ([place]) 01/18/2018   Seizures ([place])    Shortness of breath    Stroke (cerebrum) ([place]) 02/06/2015   Thrombocytopenia (HCC)    Tobacco abuse    Trichiasis without entropion 04/16/2013   Overview:  LLL central    Type 2 diabetes mellitus with peripheral neuropathy ([place]) 03/13/2015   Urinary incontinence 03/14/2015    PAST SURGICAL HISTORY: Past Surgical History:  Procedure Laterality Date   abdoninal ao angio & bifem angio  05/27/10   Patent graft, occluded bil stents with no retrograde flow into the hypogastric arteries. 100% occl left ant. tibial artery, 70% to 80% to 100% stenosis right superficial fem artery above adductor canal. 100 % occl right ant. tibial vessell   Aortogram w/ PTCA  09/292003   BACK SURGERY     BIOPSY  08/26/2018   Procedure: BIOPSY;  Surgeon: Lavena Bullion, DO;  Location: [facility];  Service: Gastroenterology;;   CARDIAC CATHETERIZATION  05/27/10   severe CAD left cx   CHOLECYSTECTOMY     ESOPHAGOGASTRIC FUNDOPLICATION     ESOPHAGOGASTRODUODENOSCOPY (EGD) WITH PROPOFOL N/A 08/26/2018   Procedure: ESOPHAGOGASTRODUODENOSCOPY (EGD) WITH PROPOFOL;  Surgeon: Lavena Bullion, DO;  Location: [facility];  Service: Gastroenterology;  Laterality: N/A;   EYE SURGERY     FEMORAL BYPASS  08/19/10   Right Fem-Pop   FLEXIBLE SIGMOIDOSCOPY N/A 08/26/2018   Procedure: FLEXIBLE SIGMOIDOSCOPY;  Surgeon: Lavena Bullion, DO;  Location: [facility];   Service: Gastroenterology;  Laterality: N/A;   HOT HEMOSTASIS N/A 08/26/2018   Procedure: HOT HEMOSTASIS (ARGON PLASMA COAGULATION/BICAP);  Surgeon: Lavena Bullion, DO;  Location[facility] END[facility]COPY;  Service: Gastroenterology;  Laterality: N/A;   IR ANGIO INTRA EXTRACRAN SEL COM CAROTID INNOMINATE BILAT MOD SED  07/20/2016   IR ANGIO INTRA EXTRACRAN SEL COM CAROTID INNOMINATE BILAT MOD SED  01/14/2018   IR ANGIO VERTEBRAL SEL SUBCLAVIAN INNOMINATE UNI R MOD SED  07/20/2016   IR ANGIO VERTEBRAL SEL VERTEBRAL BILAT MOD SED  01/14/2018   IR ANGIO VERTEBRAL SEL VERTEBRAL UNI L MOD SED  07/20/2016   IR RADIOLOGIST EVAL & MGMT  05/27/2016   IR US GUIDE VASC ACCESS RIGHT  01/14/2018   JOINT REPLACEMENT     PERIPHERAL VASCULAR CATHETERIZATION N/A 12/10/2014   Procedure: Abdominal Aortogram;  Surgeon: Angelia Mould, MD;  Location: [facility] CV LAB;  Service: Cardiovascular;  Laterality: N/A;   POLYPECTOMY  08/26/2018   Procedure: POLYPECTOMY;  Surgeon: Lavena Bullion, DO;  Location: [facility];  Service: Gastroenterology;;   RADIOLOGY WITH ANESTHESIA N/A 02/08/2015   Procedure: RADIOLOGY WITH ANESTHESIA;  Surgeon: Luanne Bras, MD;  Location: [facility];  Service: Radiology;  Laterality: N/A;   TEE WITHOUT CARDIOVERSION N/A 08/29/2014   Procedure: TRANSESOPHAGEAL ECHOCARDIOGRAM (TEE);  Surgeon: Josue Hector, MD;  Location: [facility] ENDOSCOPY;  Service: Cardiovascular;  Laterality: N/A;   TOTAL KNEE ARTHROPLASTY      FAMILY HISTORY:  Family History  Problem Relation Age of Onset   Heart attack Mother    Heart failure Mother    Cirrhosis Father    Heart failure Brother    Cancer Brother    Hypertension Neg Hx        UNKNOWN   Stroke Neg Hx        UNKNOWN    SOCIAL HISTORY:  Social History   Tobacco Use   Smoking status: Current Every Day Smoker    Packs/day: 0.50    Years: 50.00    Pack years: 25.00    Types: Cigars, Cigarettes    Start date: 02/09/1961    Smokeless tobacco: Former Systems developer    Types: Chew   Tobacco comment: 2 ppd, full flavor  Substance Use Topics   Alcohol use: Yes    Comment: occasionally, "I might drink 1 every 6 months"   Drug use: Yes    Types: Cocaine    Comment: "I smoke crack every once in a while"; last smoked yesterday    ALLERGIES:  Allergies  Allergen Reactions   Darvocet [Propoxyphene N-Acetaminophen] Hives   Haldol [Haloperidol Decanoate] Hives   Acetaminophen Nausea Only    Upset stomach, tolerates Hydrocodone/APAP if taken with food   Metformin And Related Nausea And Vomiting   Norco [Hydrocodone-Acetaminophen] Nausea And Vomiting    Tolerates if taken with food    MEDICATIONS:  Current Outpatient Medications  Medication Sig Dispense Refill   acetaminophen (TYLENOL) 325 MG tablet Take 2 tablets (650 mg total) by mouth every 4 (four) hours as needed for mild pain (or temp > 37.5 C (99.5 F)).     allopurinol (ZYLOPRIM) 300 MG tablet Take 1 tablet (300 mg total) by mouth daily. 30 tablet 3   atorvastatin (LIPITOR) 80 MG tablet Take 1 tablet (80 mg total) by mouth daily at 6 PM. (Patient taking differently: Take 80 mg by mouth daily. ) 30 tablet 3   camphor-menthol (SARNA) lotion Apply topically as needed for itching. 222 mL 0   Cholecalciferol 25 MCG (1000 UT) tablet Take 1,000 Units by mouth daily.     clopidogrel (PLAVIX) 75 MG tablet Take 1 tablet (75 mg total) by mouth daily. 30 tablet 3   DULoxetine (CYMBALTA) 60 MG capsule Take 1 capsule (60 mg total) by mouth daily. 30 capsule 3   ferrous sulfate 325 (65 FE) MG tablet Take 1 tablet (325 mg total) by mouth daily with breakfast.  3   folic acid (FOLVITE) 1 MG tablet Take 1 tablet (1 mg total) by mouth daily.     guaiFENesin (MUCINEX) 600 MG 12 hr tablet Take 1,200 mg by mouth 2 (two) times daily.     hydrocortisone 2.5 % cream Apply 1 application topically every 6 (six) hours as needed (itchiness).     Infant Care Products  (DERMACLOUD) CREA Apply 1 application topically 2 (two) times daily. Apply to buttocks     levETIRAcetam (KEPPRA) 500 MG tablet Take 1 tablet (500 mg total) by mouth 2 (two) times daily. 60 tablet 3   losartan (COZAAR) 25 MG tablet Take 1 tablet (25 mg total) by mouth daily. 30 tablet 2   Melatonin 5 MG TABS Take 5 mg by  mouth at bedtime.     memantine (NAMENDA) 10 MG tablet Take 1 tablet (10 mg total) by mouth 2 (two) times daily. 6 tablet 0   mometasone-formoterol (DULERA) 200-5 MCG/ACT AERO Inhale 2 puffs into the lungs 2 (two) times daily.     pantoprazole (PROTONIX) 40 MG tablet Take 1 tablet (40 mg total) by mouth daily. 30 tablet 3   polyethylene glycol (MIRALAX / GLYCOLAX) 17 g packet Take 17 g by mouth daily as needed for moderate constipation.     pregabalin (LYRICA) 150 MG capsule Take 1 capsule (150 mg total) by mouth 2 (two) times daily. 180 capsule 0   Skin Protectants, Misc. (EUCERIN) cream Apply 1 application topically daily.     tiZANidine (ZANAFLEX) 2 MG tablet Take 2 mg by mouth 3 (three) times daily.     traZODone (DESYREL) 100 MG tablet Take 100 mg by mouth at bedtime.     WIXELA INHUB 250-50 MCG/DOSE AEPB Inhale 1-2 puffs into the lungs daily. 1 each 0   No current facility-administered medications for this encounter.     REVIEW OF SYSTEMS:  A 10+ POINT REVIEW OF SYSTEMS WAS OBTAINED including neurology, dermatology, psychiatry, cardiac, respiratory, lymph, extremities, GI, GU, musculoskeletal, constitutional, reproductive, HEENT. He reports symptoms related to his COPD-- dyspnea on exertion and cough, for which he uses an inhaler. He denies fever, chills, night sweats, chest pain, hemoptysis, nausea or vomiting, diarrhea or constipation, headache, and visual changes.   PHYSICAL EXAM:  temporal temperature is 98.9 F (37.2 C). His blood pressure is 100/64 and his pulse is 85. His respiration is 22 (abnormal) and oxygen saturation is 96%.   General: Alert and  oriented, in no acute distress, remains in wheelchair for evaluation.  Unable to walk due to his CVA HEENT: Head is normocephalic. Extraocular movements are intact. Oropharynx is clear. Neck: Neck is supple, no palpable cervical or supraclavicular lymphadenopathy. Heart: Regular in rate and rhythm with no murmurs, rubs, or gallops. Chest: Clear to auscultation bilaterally, with no rhonchi, wheezes, or rales. Abdomen: Soft, nontender, nondistended, with no rigidity or guarding. Extremities: No cyanosis or edema. Lymphatics: see Neck Exam Skin: No concerning lesions. Musculoskeletal: symmetric strength and muscle tone throughout. Neurologic: Left hemiplegia noted Psychiatric: Judgment and insight are intact. Affect is appropriate.   ECOG = 4  0 - Asymptomatic (Fully active, able to carry on all predisease activities without restriction)  1 - Symptomatic but completely ambulatory (Restricted in physically strenuous activity but ambulatory and able to carry out work of a light or sedentary nature. For example, light housework, office work)  2 - Symptomatic, <50% in bed during the day (Ambulatory and capable of all self care but unable to carry out any work activities. Up and about more than 50% of waking hours)  3 - Symptomatic, >50% in bed, but not bedbound (Capable of only limited self-care, confined to bed or chair 50% or more of waking hours)  4 - Bedbound (Completely disabled. Cannot carry on any self-care. Totally confined to bed or chair)  5 - Death   Eustace Pen MM, Creech RH, Tormey DC, et al. [phone]). "Toxicity and response criteria of the [facility]". [place] Oncol. 5 (6): 649-55  LABORATORY DATA:  Lab Results  Component Value Date   WBC 9.3 08/26/2018   HGB 9.9 (L) 08/26/2018   HCT 35.5 (L) 08/26/2018   MCV 81.1 08/26/2018   PLT 181 08/26/2018   NEUTROABS 3.5 08/22/2018   Lab Results  Component Value Date   NA 148 (H) 08/26/2018   K 3.1 (L)  08/26/2018   CL 114 (H) 08/26/2018   CO2 27 08/26/2018   GLUCOSE 93 08/26/2018   CREATININE 0.61 08/26/2018   CALCIUM 9.4 08/26/2018      RADIOGRAPHY: Nm Pet Image Initial (pi) Skull Base To Thigh  Result Date: 10/04/2018 CLINICAL DATA:  Initial treatment strategy for non-small cell lung cancer. EXAM: NUCLEAR MEDICINE PET SKULL BASE TO THIGH TECHNIQUE: 10.9 mCi F-18 FDG was injected intravenously. Full-ring PET imaging was performed from the skull base to thigh after the radiotracer. CT data was obtained and used for attenuation correction and anatomic localization. Fasting blood glucose: 105 mg/dl COMPARISON:  CT chest abdomen pelvis dated 08/23/2018 FINDINGS: Mediastinal blood pool activity: SUV max 3.2 Liver activity: SUV max NA NECK: No hypermetabolic cervical lymphadenopathy. Incidental CT findings: none CHEST: 21 x 13 mm right upper lobe pulmonary nodule (series 8/image 29), grossly unchanged, max SUV 10.0. This appearance is compatible with primary bronchogenic neoplasm. No hypermetabolic thoracic lymphadenopathy. Incidental CT findings: Atherosclerotic calcifications of the aortic arch. Three vessel coronary atherosclerosis. ABDOMEN/PELVIS: No hypermetabolic rectal mass. No abnormal metabolism in the liver, spleen, pancreas, or right adrenal gland. 12 mm left adrenal adenoma demonstrates mild hypermetabolism, max SUV 3.5. No hypermetabolic abdominopelvic lymphadenopathy. Incidental CT findings: Status post cholecystectomy. Postsurgical changes related to prior Nissen fundoplication. Atherosclerotic calcifications the abdominal aorta and branch vessels. Aorto bi-iliac grafts. SKELETON: No focal hypermetabolic activity to suggest skeletal metastasis. Incidental CT findings: Degenerative changes of the visualized thoracolumbar spine. IMPRESSION: Hypermetabolic 2.1 cm right upper lobe pulmonary nodule, compatible primary bronchogenic neoplasm. No findings suspicious for metastatic disease. No evidence  of hypermetabolic rectal mass. Stable left adrenal adenoma, benign. Electronically Signed   By: Julian Hy M.D.   On: 10/04/2018 14:47      IMPRESSION: Suspicious stage Ia non-small cell lung cancer presented with right upper lobe pulmonary nodule, pending tissue diagnosis and staging work-up.  Patient will be a good candidate for stereotactic body radiation therapy directed to his solitary lesion in the right upper lobe. Today, I talked to the patient  about the findings and work-up thus far.  We discussed the natural history of lung cancer and general treatment, highlighting the role of radiotherapy (SBRT) in the management.  We discussed the available radiation techniques, and focused on the details of logistics and delivery.  We reviewed the anticipated acute and late sequelae associated with radiation in this setting.  The patient was encouraged to ask questions that I answered to the best of my ability.   PLAN: Patient will proceed with CT biopsy of his lung lesion on September 17.  The patient will be been scheduled for stereotactic body radiation therapy planning on September 23.  Anticipate 3 treatments directed at the solitary lesion in the right upper lobe.     ------------------------------------------------  Blair Promise, PhD, MD  This document serves as a record of services personally performed by Gery Pray, MD. It was created on his behalf by Wilburn Mylar, a trained medical scribe. The creation of this record is based on the scribe's personal observations and the provider's statements to them. This document has been checked and approved by the attending provider.

## 2018-10-24 ENCOUNTER — Encounter: Payer: Self-pay | Admitting: *Deleted

## 2018-10-24 NOTE — Progress Notes (Signed)
Oncology Nurse Navigator Documentation  Oncology Nurse Navigator Flowsheets 10/24/2018  Navigator Location [facility]  Referral Date to RadOnc/MedOnc -  Navigator Encounter Type Other/I followed up on Mr. Koman's schedule. He is re-scheduled for a biopsy this week with follow up with Rad Onc and Med Onc.   Telephone -  Patient Visit Type -  Treatment Phase Abnormal Scans  Barriers/Navigation Needs -  Education -  Interventions Other  Coordination of Care -  Education Method -  Acuity Level 1  Time Spent with Patient 15

## 2018-10-25 ENCOUNTER — Other Ambulatory Visit: Payer: Self-pay | Admitting: Radiology

## 2018-10-26 ENCOUNTER — Other Ambulatory Visit: Payer: Self-pay | Admitting: Student

## 2018-10-27 ENCOUNTER — Ambulatory Visit (HOSPITAL_COMMUNITY)
Admission: RE | Admit: 2018-10-27 | Discharge: 2018-10-27 | Disposition: A | Discharge status: Home / Self Care | Payer: Medicare Other | Source: Ambulatory Visit | Attending: Interventional Radiology | Admitting: Interventional Radiology

## 2018-10-27 ENCOUNTER — Other Ambulatory Visit: Payer: Self-pay

## 2018-10-27 ENCOUNTER — Ambulatory Visit (HOSPITAL_COMMUNITY)
Admission: RE | Admit: 2018-10-27 | Discharge: 2018-10-27 | Disposition: A | Discharge status: Home / Self Care | Payer: Medicare Other | Source: Ambulatory Visit | Attending: Internal Medicine | Admitting: Internal Medicine

## 2018-10-27 DIAGNOSIS — Z8673 Personal history of transient ischemic attack (TIA), and cerebral infarction without residual deficits: Secondary | ICD-10-CM | POA: Diagnosis not present

## 2018-10-27 DIAGNOSIS — Z20828 Contact with and (suspected) exposure to other viral communicable diseases: Secondary | ICD-10-CM | POA: Diagnosis not present

## 2018-10-27 DIAGNOSIS — E1151 Type 2 diabetes mellitus with diabetic peripheral angiopathy without gangrene: Secondary | ICD-10-CM | POA: Insufficient documentation

## 2018-10-27 DIAGNOSIS — J984 Other disorders of lung: Secondary | ICD-10-CM

## 2018-10-27 DIAGNOSIS — E1142 Type 2 diabetes mellitus with diabetic polyneuropathy: Secondary | ICD-10-CM | POA: Diagnosis not present

## 2018-10-27 DIAGNOSIS — I110 Hypertensive heart disease with heart failure: Secondary | ICD-10-CM | POA: Diagnosis not present

## 2018-10-27 DIAGNOSIS — Z7902 Long term (current) use of antithrombotics/antiplatelets: Secondary | ICD-10-CM | POA: Diagnosis not present

## 2018-10-27 DIAGNOSIS — Z01812 Encounter for preprocedural laboratory examination: Secondary | ICD-10-CM | POA: Diagnosis not present

## 2018-10-27 DIAGNOSIS — K219 Gastro-esophageal reflux disease without esophagitis: Secondary | ICD-10-CM | POA: Insufficient documentation

## 2018-10-27 DIAGNOSIS — G4733 Obstructive sleep apnea (adult) (pediatric): Secondary | ICD-10-CM | POA: Insufficient documentation

## 2018-10-27 DIAGNOSIS — F0280 Dementia in other diseases classified elsewhere without behavioral disturbance: Secondary | ICD-10-CM | POA: Insufficient documentation

## 2018-10-27 DIAGNOSIS — I252 Old myocardial infarction: Secondary | ICD-10-CM | POA: Insufficient documentation

## 2018-10-27 DIAGNOSIS — Z79899 Other long term (current) drug therapy: Secondary | ICD-10-CM | POA: Diagnosis not present

## 2018-10-27 DIAGNOSIS — E785 Hyperlipidemia, unspecified: Secondary | ICD-10-CM | POA: Diagnosis not present

## 2018-10-27 DIAGNOSIS — R918 Other nonspecific abnormal finding of lung field: Secondary | ICD-10-CM | POA: Diagnosis not present

## 2018-10-27 DIAGNOSIS — I2510 Atherosclerotic heart disease of native coronary artery without angina pectoris: Secondary | ICD-10-CM | POA: Insufficient documentation

## 2018-10-27 DIAGNOSIS — Z86718 Personal history of other venous thrombosis and embolism: Secondary | ICD-10-CM | POA: Diagnosis not present

## 2018-10-27 DIAGNOSIS — I5032 Chronic diastolic (congestive) heart failure: Secondary | ICD-10-CM | POA: Diagnosis not present

## 2018-10-27 DIAGNOSIS — M1990 Unspecified osteoarthritis, unspecified site: Secondary | ICD-10-CM | POA: Insufficient documentation

## 2018-10-27 DIAGNOSIS — Z87891 Personal history of nicotine dependence: Secondary | ICD-10-CM | POA: Insufficient documentation

## 2018-10-27 DIAGNOSIS — F419 Anxiety disorder, unspecified: Secondary | ICD-10-CM | POA: Diagnosis not present

## 2018-10-27 DIAGNOSIS — J449 Chronic obstructive pulmonary disease, unspecified: Secondary | ICD-10-CM | POA: Insufficient documentation

## 2018-10-27 DIAGNOSIS — R911 Solitary pulmonary nodule: Secondary | ICD-10-CM | POA: Diagnosis not present

## 2018-10-27 DIAGNOSIS — Z9889 Other specified postprocedural states: Secondary | ICD-10-CM

## 2018-10-27 DIAGNOSIS — I517 Cardiomegaly: Secondary | ICD-10-CM | POA: Diagnosis not present

## 2018-10-27 DIAGNOSIS — C3411 Malignant neoplasm of upper lobe, right bronchus or lung: Secondary | ICD-10-CM | POA: Diagnosis not present

## 2018-10-27 LAB — CBC
HCT: 44.3 % (ref 39.0–52.0)
Hemoglobin: 13.8 g/dL (ref 13.0–17.0)
MCH: 28.1 pg (ref 26.0–34.0)
MCHC: 31.2 g/dL (ref 30.0–36.0)
MCV: 90.2 fL (ref 80.0–100.0)
Platelets: 150 10*3/uL (ref 150–400)
RBC: 4.91 MIL/uL (ref 4.22–5.81)
RDW: 17.8 % — ABNORMAL HIGH (ref 11.5–15.5)
WBC: 4.6 10*3/uL (ref 4.0–10.5)
nRBC: 0 % (ref 0.0–0.2)

## 2018-10-27 LAB — PROTIME-INR
INR: 1 (ref 0.8–1.2)
Prothrombin Time: 13.2 seconds (ref 11.4–15.2)

## 2018-10-27 LAB — GLUCOSE, CAPILLARY
Glucose-Capillary: 106 mg/dL — ABNORMAL HIGH (ref 70–99)
Glucose-Capillary: 97 mg/dL (ref 70–99)

## 2018-10-27 LAB — SARS CORONAVIRUS 2 BY RT PCR (HOSPITAL ORDER, PERFORMED IN ~~LOC~~ HOSPITAL LAB): SARS Coronavirus 2: NEGATIVE

## 2018-10-27 MED ORDER — FENTANYL CITRATE (PF) 100 MCG/2ML IJ SOLN
INTRAMUSCULAR | Status: AC
  Filled 2018-10-27: qty 2

## 2018-10-27 MED ORDER — LIDOCAINE HCL 1 % IJ SOLN
INTRAMUSCULAR | Status: AC
  Filled 2018-10-27: qty 20

## 2018-10-27 MED ORDER — SODIUM CHLORIDE 0.9 % IV SOLN: INTRAVENOUS | Status: DC

## 2018-10-27 MED ORDER — MIDAZOLAM HCL 2 MG/2ML IJ SOLN
INTRAMUSCULAR | Status: AC
  Filled 2018-10-27: qty 2

## 2018-10-27 MED ORDER — IPRATROPIUM-ALBUTEROL 0.5-2.5 (3) MG/3ML IN SOLN
RESPIRATORY_TRACT | Status: AC
  Filled 2018-10-27: qty 3

## 2018-10-27 MED ORDER — FENTANYL CITRATE (PF) 100 MCG/2ML IJ SOLN
INTRAMUSCULAR | Status: AC | PRN
  Administered 2018-10-27: 50 ug via INTRAVENOUS

## 2018-10-27 MED ORDER — IPRATROPIUM-ALBUTEROL 0.5-2.5 (3) MG/3ML IN SOLN
3.0000 mL | Freq: Four times a day (QID) | RESPIRATORY_TRACT | Status: DC
  Administered 2018-10-27: 14:00:00 3 mL via RESPIRATORY_TRACT
  Filled 2018-10-27: qty 3

## 2018-10-27 MED ORDER — MIDAZOLAM HCL 2 MG/2ML IJ SOLN
INTRAMUSCULAR | Status: AC | PRN
  Administered 2018-10-27: 1 mg via INTRAVENOUS

## 2018-10-27 NOTE — Sedation Documentation (Signed)
Per Dr. Melina Copa verbal order, pt repositioned to right side at 1308.  O2 sats decreased to 65% on 2L when on right side.  Pt repositioned to back at 1310 and NRB mask at 15L applied to patient.  O2 sats slowly improving.  Dr Jarvis Newcomer at bedside and aware.

## 2018-10-27 NOTE — Discharge Instructions (Signed)
Moderate Conscious Sedation, Adult, Care After These instructions provide you with information about caring for yourself after your procedure. Your health care provider may also give you more specific instructions. Your treatment has been planned according to current medical practices, but problems sometimes occur. Call your health care provider if you have any problems or questions after your procedure. What can I expect after the procedure? After your procedure, it is common:  To feel sleepy for several hours.  To feel clumsy and have poor balance for several hours.  To have poor judgment for several hours.  To vomit if you eat too soon. Follow these instructions at home: For at least 24 hours after the procedure:   Do not: ? Participate in activities where you could fall or become injured. ? Drive. ? Use heavy machinery. ? Drink alcohol. ? Take sleeping pills or medicines that cause drowsiness. ? Make important decisions or sign legal documents. ? Take care of children on your own.  Rest. Eating and drinking  Follow the diet recommended by your health care provider.  If you vomit: ? Drink water, juice, or soup when you can drink without vomiting. ? Make sure you have little or no nausea before eating solid foods. General instructions  Have a responsible adult stay with you until you are awake and alert.  Take over-the-counter and prescription medicines only as told by your health care provider.  If you smoke, do not smoke without supervision.  Keep all follow-up visits as told by your health care provider. This is important. Contact a health care provider if:  You keep feeling nauseous or you keep vomiting.  You feel light-headed.  You develop a rash.  You have a fever. Get help right away if:  You have trouble breathing. This information is not intended to replace advice given to you by your health care provider. Make sure you discuss any questions you have  with your health care provider. Document Released: 11/16/2012 Document Revised: 01/08/2017 Document Reviewed: 05/18/2015 Elsevier Patient Education  [place] After This sheet gives you information about how to care for yourself after your procedure. Your health care provider may also give you more specific instructions depending on the type of biopsy you had. If you have problems or questions, contact your health care provider. What can I expect after the procedure? After the procedure, it is common to have:  A cough.  A sore throat.  Pain where a needle, bronchoscope, or incision was used to collect a biopsy sample (biopsy site). Follow these instructions at home: Medicines  Take over-the-counter and prescription medicines only as told by your health care provider.  Do not drink alcohol if your health care provider tells you not to drink.  Ask your health care provider if the medicine prescribed to you: ? Requires you to avoid driving or using heavy machinery. ? Can cause constipation. You may need to take these actions to prevent or treat constipation:  Drink enough fluid to keep your urine pale yellow.  Take over-the-counter or prescription medicines.  Eat foods that are high in fiber, such as beans, whole grains, and fresh fruits and vegetables.  Limit foods that are high in fat and processed sugars, such as fried or sweet foods.  Do not drive for 24 hours if you were given a sedative. Biopsy site care   Follow instructions from your health care provider about how to take care of your biopsy site. Make sure  you: ? Wash your hands with soap and water before and after you change your bandage (dressing). If soap and water are not available, use hand sanitizer. ? Change your dressing as told by your health care provider. ? Leave stitches (sutures), skin glue, or adhesive strips in place. These skin closures may need to stay in place for 2 weeks  or longer. If adhesive strip edges start to loosen and curl up, you may trim the loose edges. Do not remove adhesive strips completely unless your health care provider tells you to do that.  Do not take baths, swim, or use a hot tub until your health care provider approves. Ask your health care provider if you may take showers. You may only be allowed to take sponge baths.  Check your biopsy site every day for signs of infection. Check for: ? Redness, swelling, or more pain. ? Fluid or blood. ? Warmth. ? Pus or a bad smell. General instructions  Return to your normal activities as told by your health care provider. Ask your health care provider what activities are safe for you.  It is up to you to get the results of your procedure. Ask your health care provider, or the department that is doing the procedure, when your results will be ready.  Keep all follow-up visits as told by your health care provider. This is important. Contact a health care provider if:  You have a fever.  You have redness, swelling, or more pain around your biopsy site.  You have fluid or blood coming from your biopsy site.  Your biopsy site feels warm to the touch.  You have pus or a bad smell coming from your biopsy site.  You have pain that does not get better with medicine. Get help right away if:  You cough up blood.  You have trouble breathing.  You have chest pain.  You lose consciousness. Summary  After the procedure, it is common to have a sore throat and a cough.  Return to your normal activities as told by your health care provider. Ask your health care provider what activities are safe for you.  Take over-the-counter and prescription medicines only as told by your health care provider.  Report any unusual symptoms to your health care provider. This information is not intended to replace advice given to you by your health care provider. Make sure you discuss any questions you have with  your health care provider. Document Released: 02/25/2016 Document Revised: 03/02/2018 Document Reviewed: 02/25/2016 Elsevier Patient Education  2020 [employer].

## 2018-10-27 NOTE — Procedures (Signed)
  Procedure: CT core RUL lung lesion   EBL:   minimal Complications:  none immediate  See full dictation in [facility].  Dillard Cannon MD Main # [phone] Pager  [phone]

## 2018-10-27 NOTE — Sedation Documentation (Signed)
O2 sat decreased to 92% on 4L.  Encouraged patient to deep breath and cough.  Unable to cough anything up.  Placed patient on 6L O2 via simple face mask.  Will cont to monitor

## 2018-10-27 NOTE — Progress Notes (Signed)
Discharge instructions sent with pt. [facility] called and spoke with Paticka -report given

## 2018-10-27 NOTE — Progress Notes (Signed)
[facility] called spoke with [facility] and updated med list.

## 2018-10-27 NOTE — Sedation Documentation (Signed)
Notified Dr. Vernard Gambles that O2 sats remain 90-94% on RA.  Pt OK to discharge in 1 hr, pt OK to go to short stay, per Dr. Vernard Gambles.    Attempted to give report in SS, RN to call back

## 2018-10-27 NOTE — H&P (Signed)
Referring Physician(s): Mohamed,Mohamed  Supervising Physician: Arne Cleveland  Patient Status:  [facility] OP  Chief Complaint:  "I'm having a biopsy"  Subjective: Pt familiar to IR service from prior cerebral arteriogram with stent assisted angioplasty of right ICA in 2017. He is an ex smoker with multiple medical problems incl COPD, DM, HTN, HLD, prior alcohol abuse, prior LE DVT, CHF, CAD, CVA-on plavix with residual left sided weakness, OSA, and PVD. Recent PET scan reveals hypermetabolic 2.1 cm RUL pulmonary nodule suspicious for carcinoma. He presents today for CT guided bx of the rt lung nodule for further evaluation. He denies fever, HA,CP, abd/back pain,N/V or bleeding. He does have dyspnea and cough. Updated COVID pend. Last neg on 10/20/18. Additional hx as below.   Past Medical History:  Diagnosis Date  . Acute blood loss anemia   . Acute CVA (cerebrovascular accident) ([place]) 08/27/2014  . Acute encephalopathy 11/28/2017  . Acute ischemic stroke ([place])   . AKI (acute kidney injury) ([place])   . Alcohol abuse    H/O  . Alcohol dependence with withdrawal with complication ([place]) 3/82/5053  . Anxiety   . Arterial ischemic stroke, MCA (middle cerebral artery), right, acute ([place])   . Arthritis   . Asthma   . Binocular vision disorder with diplopia 01/03/2013  . CAD (coronary artery disease) 05/27/10   Cath: severe single vessell CAD left cx midportion obtuse marginal 2 to 3.  . Cerebral infarction due to embolism of right middle cerebral artery ([place])   . Cerebral infarction due to stenosis of right carotid artery ([place]) 11/01/2014  . Cerebral thrombosis with cerebral infarction 07/18/2016  . Cerebrovascular accident (CVA) due to stenosis of right carotid artery ([place])   . CHF (congestive heart failure) ([place])   . Closed blow-out fracture of floor of orbit ([place]) 01/19/2013  . COPD (chronic obstructive pulmonary disease) ([place])   . COPD (chronic obstructive pulmonary disease) ([place])   .  CVA (cerebral vascular accident) ([place]) 08/26/2014  . Dementia due to another medical condition ([place]) 07/12/2015  . Depression   . Diabetes mellitus, type 2 ([place]) 03/13/2015  . Diastolic CHF, acute on chronic (HCC) 11/01/2014  . DOE (dyspnea on exertion) 11/23/2014  . DVT (deep venous thrombosis) ([place]) 02/22/2014  . Embolic stroke ([place])   . Enophthalmos due to trauma 01/19/2013  . Eyelid lesion 01/24/2013  . Fracture of inferior orbital wall ([place]) 01/03/2013  . Fracture of orbital floor ([place]) 01/03/2013  . GERD (gastroesophageal reflux disease)   . H/O total knee replacement 10/24/2014  . HCAP (healthcare-associated pneumonia) 10/15/2015  . Headache around the eyes 08/27/2014  . History of DVT (deep vein thrombosis)   . History of DVT of lower extremity   . Hyperkalemia 10/24/2014  . Hyperlipemia   . Hyperlipidemia   . Hypertension   . Hypoalbuminemia due to protein-calorie malnutrition ([place])   . Hypotension   . Labile blood pressure   . Left shoulder pain 09/28/2017  . Lower extremity edema 02/21/2014  . Medial orbital wall fracture 01/19/2013  . Myocardial infarction ([place]) 2000  . Orbital fracture 12/2012  . OSA on CPAP   . Pain of left eye 01/03/2013  . Peripheral vascular disease, unspecified ([place])    08/20/10 doppler: increase in right ABI post-op. Left ABI stable. S/P bi-fem bypass surgery  . Pneumonia 04/04/2012  . Right middle cerebral artery stroke ([place]) 01/18/2018  . Seizures ([place])   . Shortness of breath   . Stroke (cerebrum) ([place]) 02/06/2015  .  Thrombocytopenia ([place])   . Tobacco abuse   . Trichiasis without entropion 04/16/2013   Overview:  LLL central   . Type 2 diabetes mellitus with peripheral neuropathy ([place]) 03/13/2015  . Urinary incontinence 03/14/2015   Past Surgical History:  Procedure Laterality Date  . abdoninal ao angio & bifem angio  05/27/10   Patent graft, occluded bil stents with no retrograde flow into the hypogastric arteries. 100% occl left ant. tibial artery,  70% to 80% to 100% stenosis right superficial fem artery above adductor canal. 100 % occl right ant. tibial vessell  . Aortogram w/ PTCA  09/292003  . BACK SURGERY    . BIOPSY  08/26/2018   Procedure: BIOPSY;  Surgeon: Lavena Bullion, DO;  Location: [place] [facility];  Service: Gastroenterology;;  . CARDIAC CATHETERIZATION  05/27/10   severe CAD left cx  . CHOLECYSTECTOMY    . ESOPHAGOGASTRIC FUNDOPLICATION    . ESOPHAGOGASTRODUODENOSCOPY (EGD) WITH PROPOFOL N/A 08/26/2018   Procedure: ESOPHAGOGASTRODUODENOSCOPY (EGD) WITH PROPOFOL;  Surgeon: Lavena Bullion, DO;  Location: [place];  Service: Gastroenterology;  Laterality: N/A;  . EYE SURGERY    . FEMORAL BYPASS  08/19/10   Right Fem-Pop  . FLEXIBLE SIGMOIDOSCOPY N/A 08/26/2018   Procedure: FLEXIBLE SIGMOIDOSCOPY;  Surgeon: Lavena Bullion, DO;  Location: [place];  Service: Gastroenterology;  Laterality: N/A;  . HOT HEMOSTASIS N/A 08/26/2018   Procedure: HOT HEMOSTASIS (ARGON PLASMA COAGULATION/BICAP);  Surgeon: Lavena Bullion, DO;  Location: [facility] ENDOSCOPY;  Service: Gastroenterology;  Laterality: N/A;  . IR ANGIO INTRA EXTRACRAN SEL COM CAROTID INNOMINATE BILAT MOD SED  07/20/2016  . IR ANGIO INTRA EXTRACRAN SEL COM CAROTID INNOMINATE BILAT MOD SED  01/14/2018  . IR ANGIO VERTEBRAL SEL SUBCLAVIAN INNOMINATE UNI R MOD SED  07/20/2016  . IR ANGIO VERTEBRAL SEL VERTEBRAL BILAT MOD SED  01/14/2018  . IR ANGIO VERTEBRAL SEL VERTEBRAL UNI L MOD SED  07/20/2016  . IR RADIOLOGIST EVAL & MGMT  05/27/2016  . IR US GUIDE VASC ACCESS RIGHT  01/14/2018  . JOINT REPLACEMENT    . PERIPHERAL VASCULAR CATHETERIZATION N/A 12/10/2014   Procedure: Abdominal Aortogram;  Surgeon: Angelia Mould, MD;  Location: [place] CV LAB;  Service: Cardiovascular;  Laterality: N/A;  . POLYPECTOMY  08/26/2018   Procedure: POLYPECTOMY;  Surgeon: Lavena Bullion, DO;  Location: [place] END[facility]COPY;  Service: Gastroenterology;;  . RADIOLOGY WITH ANESTHESIA N/A  02/08/2015   Procedure: RADIOLOGY WITH ANESTHESIA;  Surgeon: Luanne Bras, MD;  Location: [place];  Service: Radiology;  Laterality: N/A;  . TEE WITHOUT CARDIOVERSION N/A 08/29/2014   Procedure: TRANSESOPHAGEAL ECHOCARDIOGRAM (TEE);  Surgeon: Josue Hector, MD;  Location: [facility] ENDOSCOPY;  Service: Cardiovascular;  Laterality: N/A;  . TOTAL KNEE ARTHROPLASTY       Allergies: Darvocet [propoxyphene n-acetaminophen], Haldol [haloperidol decanoate], Acetaminophen, Metformin and related, and Norco [hydrocodone-acetaminophen]  Medications: Prior to Admission medications   Medication Sig Start Date End Date Taking? Authorizing Provider  allopurinol (ZYLOPRIM) 300 MG tablet Take 1 tablet (300 mg total) by mouth daily. 04/01/18  Yes Milus Banister C, DO  atorvastatin (LIPITOR) 80 MG tablet Take 1 tablet (80 mg total) by mouth daily at 6 PM. Patient taking differently: Take 80 mg by mouth daily.  04/01/18  Yes Daisy Floro, DO  camphor-menthol [facility]) lotion Apply topically as needed for itching. 04/01/18  Yes Daisy Floro, DO  Cholecalciferol 25 MCG (1000 UT) tablet Take 1,000 Units by mouth daily.   Yes [provider]  clopidogrel (PLAVIX) 75 MG tablet Take 1 tablet (75 mg total) by mouth daily. 04/01/18  Yes Milus Banister C, DO  docusate sodium (COLACE) 100 MG capsule Take 100 mg by mouth daily.   Yes [provider]  DULoxetine (CYMBALTA) 60 MG capsule Take 1 capsule (60 mg total) by mouth daily. 04/01/18  Yes Milus Banister C, DO  ferrous sulfate 325 (65 FE) MG tablet Take 1 tablet (325 mg total) by mouth daily with breakfast. 08/26/18  Yes Carollee Leitz, MD  folic acid (FOLVITE) 1 MG tablet Take 1 tablet (1 mg total) by mouth daily. 04/01/18  Yes Daisy Floro, DO  guaiFENesin (MUCINEX) 600 MG 12 hr tablet Take 1,200 mg by mouth 2 (two) times daily.   Yes [provider]  hydrocortisone 2.5 % cream Apply 1 application topically every 6 (six) hours as  needed (itchiness).   Yes [provider]  Infant Care Products (DERMACLOUD) CREA Apply 1 application topically 2 (two) times daily. Apply to buttocks   Yes [provider]  levETIRAcetam (KEPPRA) 500 MG tablet Take 1 tablet (500 mg total) by mouth 2 (two) times daily. 04/01/18  Yes Milus Banister C, DO  losartan (COZAAR) 25 MG tablet Take 1 tablet (25 mg total) by mouth daily. 04/02/18  Yes Daisy Floro, DO  Melatonin 5 MG TABS Take 5 mg by mouth at bedtime.   Yes [provider]  memantine (NAMENDA) 10 MG tablet Take 1 tablet (10 mg total) by mouth 2 (two) times daily. 04/01/18  Yes Milus Banister C, DO  pantoprazole (PROTONIX) 40 MG tablet Take 1 tablet (40 mg total) by mouth daily. 04/01/18  Yes Milus Banister C, DO  pregabalin (LYRICA) 150 MG capsule Take 1 capsule (150 mg total) by mouth 2 (two) times daily. 04/01/18  Yes Daisy Floro, DO  Skin Protectants, Misc. (EUCERIN) cream Apply 1 application topically daily.   Yes [provider]  tiZANidine (ZANAFLEX) 2 MG tablet Take 2 mg by mouth 3 (three) times daily.   Yes [provider]  traZODone (DESYREL) 100 MG tablet Take 100 mg by mouth at bedtime.   Yes [provider]  Grant Ruts INHUB 250-50 MCG/DOSE AEPB Inhale 1-2 puffs into the lungs daily. Patient taking differently: Inhale 2 puffs into the lungs daily.  04/01/18  Yes Daisy Floro, DO  acetaminophen (TYLENOL) 325 MG tablet Take 2 tablets (650 mg total) by mouth every 4 (four) hours as needed for mild pain (or temp > 37.5 C (99.5 F)). Patient not taking: Reported on 10/24/2018 04/01/18   Daisy Floro, DO  mometasone-formoterol [facility]) 200-5 MCG/ACT AERO Inhale 2 puffs into the lungs 2 (two) times daily. Patient not taking: Reported on 10/24/2018 04/01/18   Milus Banister C, DO  polyethylene glycol (MIRALAX / GLYCOLAX) 17 g packet Take 17 g by mouth daily as needed for moderate constipation.    [provider]     Vital Signs: BP (!) 158/74   Pulse 71   Temp 97.6 F (36.4 C) (Skin)   Resp 16   Ht 6' (1.829 m)   Wt 217 lb (98.4 kg)   SpO2 97%   BMI 29.43 kg/m   Physical Exam awake, answers questions appropriately; chest with dim BS throughout but few scatt rhonchi noted; heart- RRR; abd- soft,+BS,NT; LE edema noted; left UE/LE weakness noted  Imaging: No results found.  Labs:  CBC: Recent Labs    08/24/18 0447 08/24/18 1514 08/25/18 0442 08/26/18  0626  WBC 6.0 7.8 8.9 9.3  HGB 8.7* 9.8* 8.6* 9.9*  HCT 30.5* 34.3* 30.5* 35.5*  PLT 164 191 167 181    COAGS: Recent Labs    11/28/17 1906 01/12/18 2111 03/25/18 1448 08/22/18 1430  INR 1.15 1.01 1.12 1.2  APTT  --  29 29 29     BMP: Recent Labs    08/23/18 0513 08/24/18 0447 08/25/18 0442 08/26/18 0626  NA 147* 146* 144 148*  K 3.2* 3.5 3.5 3.1*  CL 114* 115* 113* 114*  CO2 25 24 23 27   GLUCOSE 114* 102* 117* 93  BUN <5* 5* 8 <5*  CALCIUM 9.3 9.2 9.2 9.4  CREATININE 0.64 0.55* 0.67 0.61  GFRNONAA >60 >60 >60 >60  GFRAA >60 >60 >60 >60    LIVER FUNCTION TESTS: Recent Labs    01/19/18 0655 03/25/18 1448 04/25/18 1354 08/22/18 1430  BILITOT 0.9 0.4 0.5 0.8  AST 37 19 17 15   ALT 30 14 17 11   ALKPHOS 66 66 90 67  PROT 5.9* 6.7 6.8 5.9*  ALBUMIN 3.1* 3.6 3.5 3.2*    Assessment and Plan: 68 yo ex smoker with multiple medical problems incl COPD, DM, HTN, HLD, prior alcohol abuse, prior LE DVT, CHF, CAD, CVA-on plavix with residual left sided weakness, OSA, and PVD. Recent PET scan reveals hypermetabolic 2.1 cm RUL pulmonary nodule suspicious for carcinoma. He presents today for CT guided bx of the rt lung nodule for further evaluation. Risks and benefits of procedure was discussed with the patient  including, but not limited to bleeding, pneumothorax requiring chest tube placement, infection, damage to adjacent structures or low yield requiring additional tests and death. COVID -19 neg.     All of the questions were answered and there is agreement to proceed.  Consent signed and in chart.     Electronically Signed: D. Rowe Robert, PA-C 10/27/2018, 10:34 AM   I spent a total of 30 minutes at the the patient's bedside AND on the patient's hospital floor or unit, greater than 50% of which was counseling/coordinating care for CT guided right lung nodule biopsy

## 2018-10-27 NOTE — Sedation Documentation (Signed)
Pt transferred to hospital bed with 6 person assist.  Dr. Vernard Gambles gave verbal order to position patient on right side.  Pt positioned on right side at 1316.  O2 sats declined to mid 70's with 15L O2 via NRB while patient positioned on right side.  Pt repositioned to supine with HOB at 30 degrees.  O2 sats improved.  Pt AOx3, interactive during episodes of desatting.  Lung sounds on right severely diminished top, middle, lower.  Left side lung sounds course upper and lower lobes.  Pt denies pain at this time.  Dr. Vernard Gambles at bedside and aware.  Stat PCXR ordered.  Will cont to monitor

## 2018-10-27 NOTE — Progress Notes (Signed)
Respiratory therapy at bedside administering DOUNEB treatment.

## 2018-10-28 ENCOUNTER — Telehealth: Payer: Self-pay | Admitting: *Deleted

## 2018-10-28 ENCOUNTER — Other Ambulatory Visit: Payer: Self-pay

## 2018-10-28 DIAGNOSIS — R238 Other skin changes: Secondary | ICD-10-CM | POA: Diagnosis not present

## 2018-10-28 NOTE — Telephone Encounter (Signed)
Received call from [place] at [facility] requesting discharge summary on pt from his CT guided biopsy done yesterday. Fax'd AVS to her @ [phone]

## 2018-10-31 DIAGNOSIS — D649 Anemia, unspecified: Secondary | ICD-10-CM | POA: Diagnosis not present

## 2018-10-31 LAB — SURGICAL PATHOLOGY

## 2018-11-02 ENCOUNTER — Ambulatory Visit: Payer: Medicare Other | Admitting: Radiation Oncology

## 2018-11-03 ENCOUNTER — Encounter: Payer: Self-pay | Admitting: *Deleted

## 2018-11-03 NOTE — Progress Notes (Signed)
Oncology Nurse Navigator Documentation  Oncology Nurse Navigator Flowsheets 11/03/2018  Navigator Location [facility]  Referral Date to RadOnc/MedOnc -  Navigator Encounter Type Other/I followed up on [name]'s schedule. He is set up for his treatment plan.   Telephone -  Patient Visit Type -  Treatment Phase Pre-Tx/Tx Discussion  Barriers/Navigation Needs Coordination of Care  Education -  Interventions Coordination of Care  Coordination of Care -  Education Method -  Acuity Level 2-Minimal Needs (1-2 Barriers Identified)  Time Spent with Patient 15

## 2018-11-04 ENCOUNTER — Other Ambulatory Visit: Payer: Self-pay

## 2018-11-04 ENCOUNTER — Ambulatory Visit
Admission: RE | Admit: 2018-11-04 | Discharge: 2018-11-04 | Disposition: A | Discharge status: Home / Self Care | Payer: Medicare Other | Source: Ambulatory Visit | Attending: Radiation Oncology | Admitting: Radiation Oncology

## 2018-11-04 DIAGNOSIS — C3411 Malignant neoplasm of upper lobe, right bronchus or lung: Secondary | ICD-10-CM | POA: Diagnosis not present

## 2018-11-04 DIAGNOSIS — Z510 Encounter for antineoplastic radiation therapy: Secondary | ICD-10-CM | POA: Diagnosis not present

## 2018-11-04 DIAGNOSIS — F17210 Nicotine dependence, cigarettes, uncomplicated: Secondary | ICD-10-CM | POA: Diagnosis not present

## 2018-11-04 DIAGNOSIS — G4701 Insomnia due to medical condition: Secondary | ICD-10-CM | POA: Diagnosis not present

## 2018-11-04 DIAGNOSIS — R911 Solitary pulmonary nodule: Secondary | ICD-10-CM

## 2018-11-04 NOTE — Progress Notes (Signed)
  Radiation Oncology         [phone] ________________________________  Name: [name] MRN: [id number]  Date: 11/04/2018  DOB: 1950/06/27   STEREOTACTIC BODY RADIOTHERAPY SIMULATION AND TREATMENT PLANNING NOTE    DIAGNOSIS:  stage IA3 non-small cell lung cancer presenting in the right upper lobe (T1c, N0)  DIAGNOSIS:   A. LUNG, RIGHT UPPER LOBE, BIOPSY:  - Non-small cell carcinoma.   COMMENT:   Immunohistochemistry for TTF-1 and Napsin-A is positive. CDX-2 and p40  are negative. The immunophenotype is compatible with pulmonary  adenocarcinoma. Intradept. consultation (Dr. Melina Copa).    NARRATIVE:  The patient was brought to the [facility].  Identity was confirmed.  All relevant records and images related to the planned course of therapy were reviewed.  The patient freely provided informed written consent to proceed with treatment after reviewing the details related to the planned course of therapy. The consent form was witnessed and verified by the simulation staff.  Then, the patient was set-up in a stable reproducible  supine position for radiation therapy.  A BodyFix immobilization pillow was fabricated for reproducible positioning.  Then I personally applied the abdominal compression paddle to limit respiratory excursion.  4D respiratoy motion management CT images were obtained.  Surface markings were placed.  The CT images were loaded into the planning software.  Then, using Cine, MIP, and standard views, the internal target volume (ITV) and planning target volumes (PTV) were delinieated, and avoidance structures were contoured.  Treatment planning then occurred.  The radiation prescription was entered and confirmed.  A total of two complex treatment devices were fabricated in the form of the BodyFix immobilization pillow and a neck accuform cushion.  I have requested : 3D Simulation  I have requested a DVH of the following structures: Heart, Lungs,  Esophagus, Chest Wall, Brachial Plexus, Major Blood Vessels, and targets.  PLAN:  The patient will receive 54 Gy in 3 fractions.  -----------------------------------  Blair Promise, PhD, MD

## 2018-11-07 DIAGNOSIS — J449 Chronic obstructive pulmonary disease, unspecified: Secondary | ICD-10-CM | POA: Diagnosis not present

## 2018-11-07 DIAGNOSIS — I69354 Hemiplegia and hemiparesis following cerebral infarction affecting left non-dominant side: Secondary | ICD-10-CM | POA: Diagnosis not present

## 2018-11-07 DIAGNOSIS — I10 Essential (primary) hypertension: Secondary | ICD-10-CM | POA: Diagnosis not present

## 2018-11-09 DIAGNOSIS — Z139 Encounter for screening, unspecified: Secondary | ICD-10-CM | POA: Diagnosis not present

## 2018-11-09 DIAGNOSIS — I10 Essential (primary) hypertension: Secondary | ICD-10-CM | POA: Diagnosis not present

## 2018-11-09 DIAGNOSIS — C3411 Malignant neoplasm of upper lobe, right bronchus or lung: Secondary | ICD-10-CM | POA: Diagnosis not present

## 2018-11-09 DIAGNOSIS — I5033 Acute on chronic diastolic (congestive) heart failure: Secondary | ICD-10-CM | POA: Diagnosis not present

## 2018-11-09 DIAGNOSIS — D649 Anemia, unspecified: Secondary | ICD-10-CM | POA: Diagnosis not present

## 2018-11-09 DIAGNOSIS — U071 COVID-19: Secondary | ICD-10-CM | POA: Diagnosis not present

## 2018-11-09 DIAGNOSIS — E785 Hyperlipidemia, unspecified: Secondary | ICD-10-CM | POA: Diagnosis not present

## 2018-11-09 DIAGNOSIS — Z510 Encounter for antineoplastic radiation therapy: Secondary | ICD-10-CM | POA: Diagnosis not present

## 2018-11-10 ENCOUNTER — Other Ambulatory Visit: Payer: Self-pay | Admitting: *Deleted

## 2018-11-10 NOTE — Progress Notes (Signed)
The proposed treatment discussed in cancer conference 11/10/18 is for discussion purpose only and is not a binding recommendation.  The patient was not physically examined nor present for their treatment options.  Therefore, final treatment plans cannot be decided.

## 2018-11-17 ENCOUNTER — Ambulatory Visit: Payer: Medicare Other | Admitting: Radiation Oncology

## 2018-11-22 ENCOUNTER — Ambulatory Visit: Payer: Medicare Other | Admitting: Radiation Oncology

## 2018-11-24 ENCOUNTER — Ambulatory Visit: Payer: Medicare Other | Admitting: Radiation Oncology

## 2018-11-24 DIAGNOSIS — L89153 Pressure ulcer of sacral region, stage 3: Secondary | ICD-10-CM | POA: Diagnosis not present

## 2018-11-25 ENCOUNTER — Telehealth: Payer: Self-pay | Admitting: *Deleted

## 2018-11-25 NOTE — Telephone Encounter (Signed)
Oncology Nurse Navigator Documentation  Oncology Nurse Navigator Flowsheets 11/25/2018  Navigator Location [facility]  Referral Date to RadOnc/MedOnc -  Navigator Encounter Type Telephone/I received a message from [place] that they have been unable to reach [name] to update him on appt.  I called his facility, [facility] at [phone] and updated their transportation on his appt for 11/28/20.    Telephone Outgoing Call  Patient Visit Type -  Treatment Phase Treatment  Barriers/Navigation Needs Coordination of Care;Education  Education Other  Interventions Coordination of Care;Education  Coordination of Care Other  Education Method Verbal  Acuity Level 2-Minimal Needs (1-2 Barriers Identified)  Time Spent with Patient 30

## 2018-11-29 ENCOUNTER — Ambulatory Visit
Admission: RE | Admit: 2018-11-29 | Discharge: 2018-11-29 | Disposition: A | Discharge status: Home / Self Care | Payer: Medicare Other | Source: Ambulatory Visit | Attending: Radiation Oncology | Admitting: Radiation Oncology

## 2018-11-29 ENCOUNTER — Encounter: Payer: Self-pay | Admitting: *Deleted

## 2018-11-29 ENCOUNTER — Other Ambulatory Visit: Payer: Self-pay

## 2018-11-29 DIAGNOSIS — C3411 Malignant neoplasm of upper lobe, right bronchus or lung: Secondary | ICD-10-CM | POA: Insufficient documentation

## 2018-11-29 DIAGNOSIS — F17210 Nicotine dependence, cigarettes, uncomplicated: Secondary | ICD-10-CM | POA: Insufficient documentation

## 2018-11-29 DIAGNOSIS — R911 Solitary pulmonary nodule: Secondary | ICD-10-CM

## 2018-11-29 DIAGNOSIS — Z510 Encounter for antineoplastic radiation therapy: Secondary | ICD-10-CM | POA: Insufficient documentation

## 2018-11-29 NOTE — Progress Notes (Signed)
  Radiation Oncology         [phone] ________________________________  Name: [name] MRN: [id number]  Date: 11/29/2018  DOB: 19-Mar-1950  Stereotactic Body Radiotherapy Treatment Procedure Note  NARRATIVE:  [name] was brought to the stereotactic radiation treatment machine and placed supine on the CT couch. The patient was set up for stereotactic body radiotherapy on the body fix pillow.  3D TREATMENT PLANNING AND DOSIMETRY:  The patient's radiation plan was reviewed and approved prior to starting treatment.  It showed 3-dimensional radiation distributions overlaid onto the planning CT.  The [facility] for the target structures as well as the organs at risk were reviewed. The documentation of this is filed in the radiation oncology EMR.  SIMULATION VERIFICATION:  The patient underwent CT imaging on the treatment unit.  These were carefully aligned to document that the ablative radiation dose would cover the target volume and maximally spare the nearby organs at risk according to the planned distribution.  SPECIAL TREATMENT PROCEDURE: [name] received high dose ablative stereotactic body radiotherapy to the planned target volume without unforeseen complications. Treatment was delivered uneventfully. The high doses associated with stereotactic body radiotherapy and the significant potential risks require careful treatment set up and patient monitoring constituting a special treatment procedure   STEREOTACTIC TREATMENT MANAGEMENT:  Following delivery, the patient was evaluated clinically. The patient tolerated treatment without significant acute effects, and was discharged to home in stable condition.    PLAN: Continue treatment as planned.  ________________________________  Blair Promise, PhD, MD

## 2018-11-29 NOTE — Progress Notes (Signed)
Oncology Nurse Navigator Documentation  Oncology Nurse Navigator Flowsheets 11/29/2018  Diagnosis Status Confirmed Diagnosis Complete  Phase of Treatment Radiation  Navigator Follow Up Date: 12/08/2018  Navigator Follow Up Reason: Appointment Review  Navigator Location [facility]  Referral Date to RadOnc/MedOnc -  Navigator Encounter Type Telephone/I received a message from rad onc that they needed help in updating [name]'s facility of his appts. I called and spoke with their transportation and updated on appts.   Telephone Outgoing Call  Abnormal Finding Date 08/24/2018  Confirmed Diagnosis Date 10/27/2018  Treatment Initiated Date 11/04/2018  Patient Visit Type RadOnc  Treatment Phase Treatment  Barriers/Navigation Needs Coordination of Care;Education  Education Other  Interventions Coordination of Care;Education  Coordination of Care Other  Education Method Verbal  Acuity Level 2-Minimal Needs (1-2 Barriers Identified)  Time Spent with Patient 58

## 2018-12-01 ENCOUNTER — Ambulatory Visit
Admission: RE | Admit: 2018-12-01 | Discharge: 2018-12-01 | Disposition: A | Discharge status: Home / Self Care | Payer: Medicare Other | Source: Ambulatory Visit | Attending: Radiation Oncology | Admitting: Radiation Oncology

## 2018-12-01 ENCOUNTER — Other Ambulatory Visit: Payer: Self-pay

## 2018-12-01 DIAGNOSIS — R911 Solitary pulmonary nodule: Secondary | ICD-10-CM

## 2018-12-01 DIAGNOSIS — Z510 Encounter for antineoplastic radiation therapy: Secondary | ICD-10-CM | POA: Diagnosis not present

## 2018-12-01 DIAGNOSIS — C3411 Malignant neoplasm of upper lobe, right bronchus or lung: Secondary | ICD-10-CM | POA: Diagnosis not present

## 2018-12-01 DIAGNOSIS — L89153 Pressure ulcer of sacral region, stage 3: Secondary | ICD-10-CM | POA: Diagnosis not present

## 2018-12-01 NOTE — Progress Notes (Signed)
  Radiation Oncology         [phone] ________________________________  Name: [name] MRN: [id number]  Date: 12/01/2018  DOB: Nov 27, 1950  Stereotactic Body Radiotherapy Treatment Procedure Note  NARRATIVE:  [name] was brought to the stereotactic radiation treatment machine and placed supine on the CT couch. The patient was set up for stereotactic body radiotherapy on the body fix pillow.  3D TREATMENT PLANNING AND DOSIMETRY:  The patient's radiation plan was reviewed and approved prior to starting treatment.  It showed 3-dimensional radiation distributions overlaid onto the planning CT.  The [facility] for the target structures as well as the organs at risk were reviewed. The documentation of this is filed in the radiation oncology EMR.  SIMULATION VERIFICATION:  The patient underwent CT imaging on the treatment unit.  These were carefully aligned to document that the ablative radiation dose would cover the target volume and maximally spare the nearby organs at risk according to the planned distribution.  SPECIAL TREATMENT PROCEDURE: [name] received high dose ablative stereotactic body radiotherapy to the planned target volume without unforeseen complications. Treatment was delivered uneventfully. The high doses associated with stereotactic body radiotherapy and the significant potential risks require careful treatment set up and patient monitoring constituting a special treatment procedure   STEREOTACTIC TREATMENT MANAGEMENT:  Following delivery, the patient was evaluated clinically. The patient tolerated treatment without significant acute effects, and was discharged to home in stable condition.    PLAN: Continue treatment as planned.  ________________________________  Blair Promise, PhD, MD

## 2018-12-06 ENCOUNTER — Other Ambulatory Visit: Payer: Self-pay

## 2018-12-06 ENCOUNTER — Ambulatory Visit
Admission: RE | Admit: 2018-12-06 | Discharge: 2018-12-06 | Disposition: A | Discharge status: Home / Self Care | Payer: Medicare Other | Source: Ambulatory Visit | Attending: Radiation Oncology | Admitting: Radiation Oncology

## 2018-12-06 ENCOUNTER — Encounter: Payer: Self-pay | Admitting: Radiation Oncology

## 2018-12-06 DIAGNOSIS — C3411 Malignant neoplasm of upper lobe, right bronchus or lung: Secondary | ICD-10-CM | POA: Diagnosis not present

## 2018-12-06 DIAGNOSIS — Z510 Encounter for antineoplastic radiation therapy: Secondary | ICD-10-CM | POA: Diagnosis not present

## 2018-12-08 DIAGNOSIS — G4701 Insomnia due to medical condition: Secondary | ICD-10-CM | POA: Diagnosis not present

## 2018-12-12 DIAGNOSIS — Z5181 Encounter for therapeutic drug level monitoring: Secondary | ICD-10-CM | POA: Diagnosis not present

## 2018-12-19 DIAGNOSIS — C3490 Malignant neoplasm of unspecified part of unspecified bronchus or lung: Secondary | ICD-10-CM | POA: Diagnosis not present

## 2018-12-19 DIAGNOSIS — R5383 Other fatigue: Secondary | ICD-10-CM | POA: Diagnosis not present

## 2018-12-19 DIAGNOSIS — L22 Diaper dermatitis: Secondary | ICD-10-CM | POA: Diagnosis not present

## 2018-12-19 DIAGNOSIS — R197 Diarrhea, unspecified: Secondary | ICD-10-CM | POA: Diagnosis not present

## 2018-12-19 DIAGNOSIS — R5381 Other malaise: Secondary | ICD-10-CM | POA: Diagnosis not present

## 2018-12-19 IMAGING — CR DG CHEST 2V
2 series · 2 of 2 positions shown · non-contrast
Comparison: 01/05/2017, 12/12/2012

CLINICAL DATA: Sepsis, cough last night, flu like symptoms

EXAM:
CHEST  2 VIEW

[w chest lat]
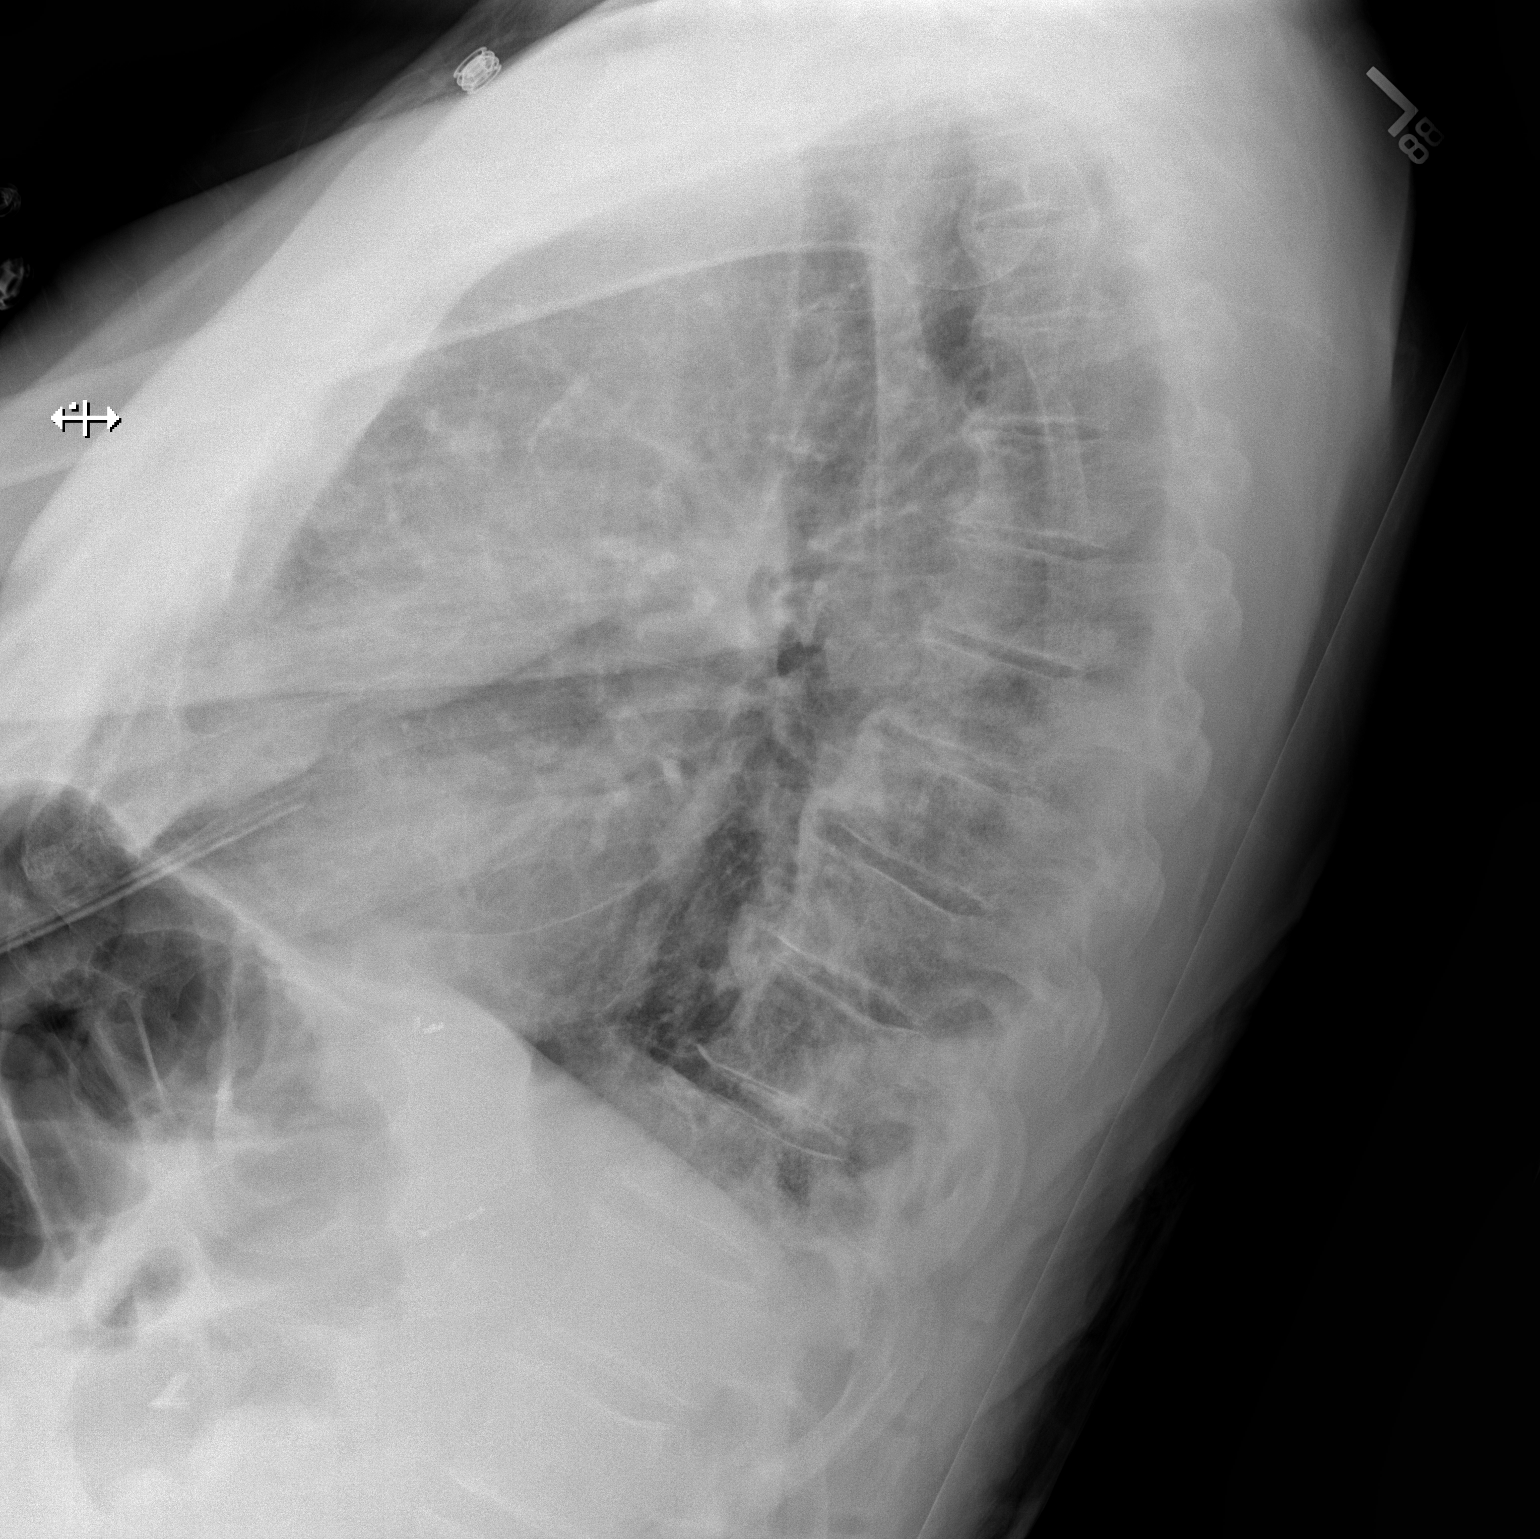

[x chest ap]
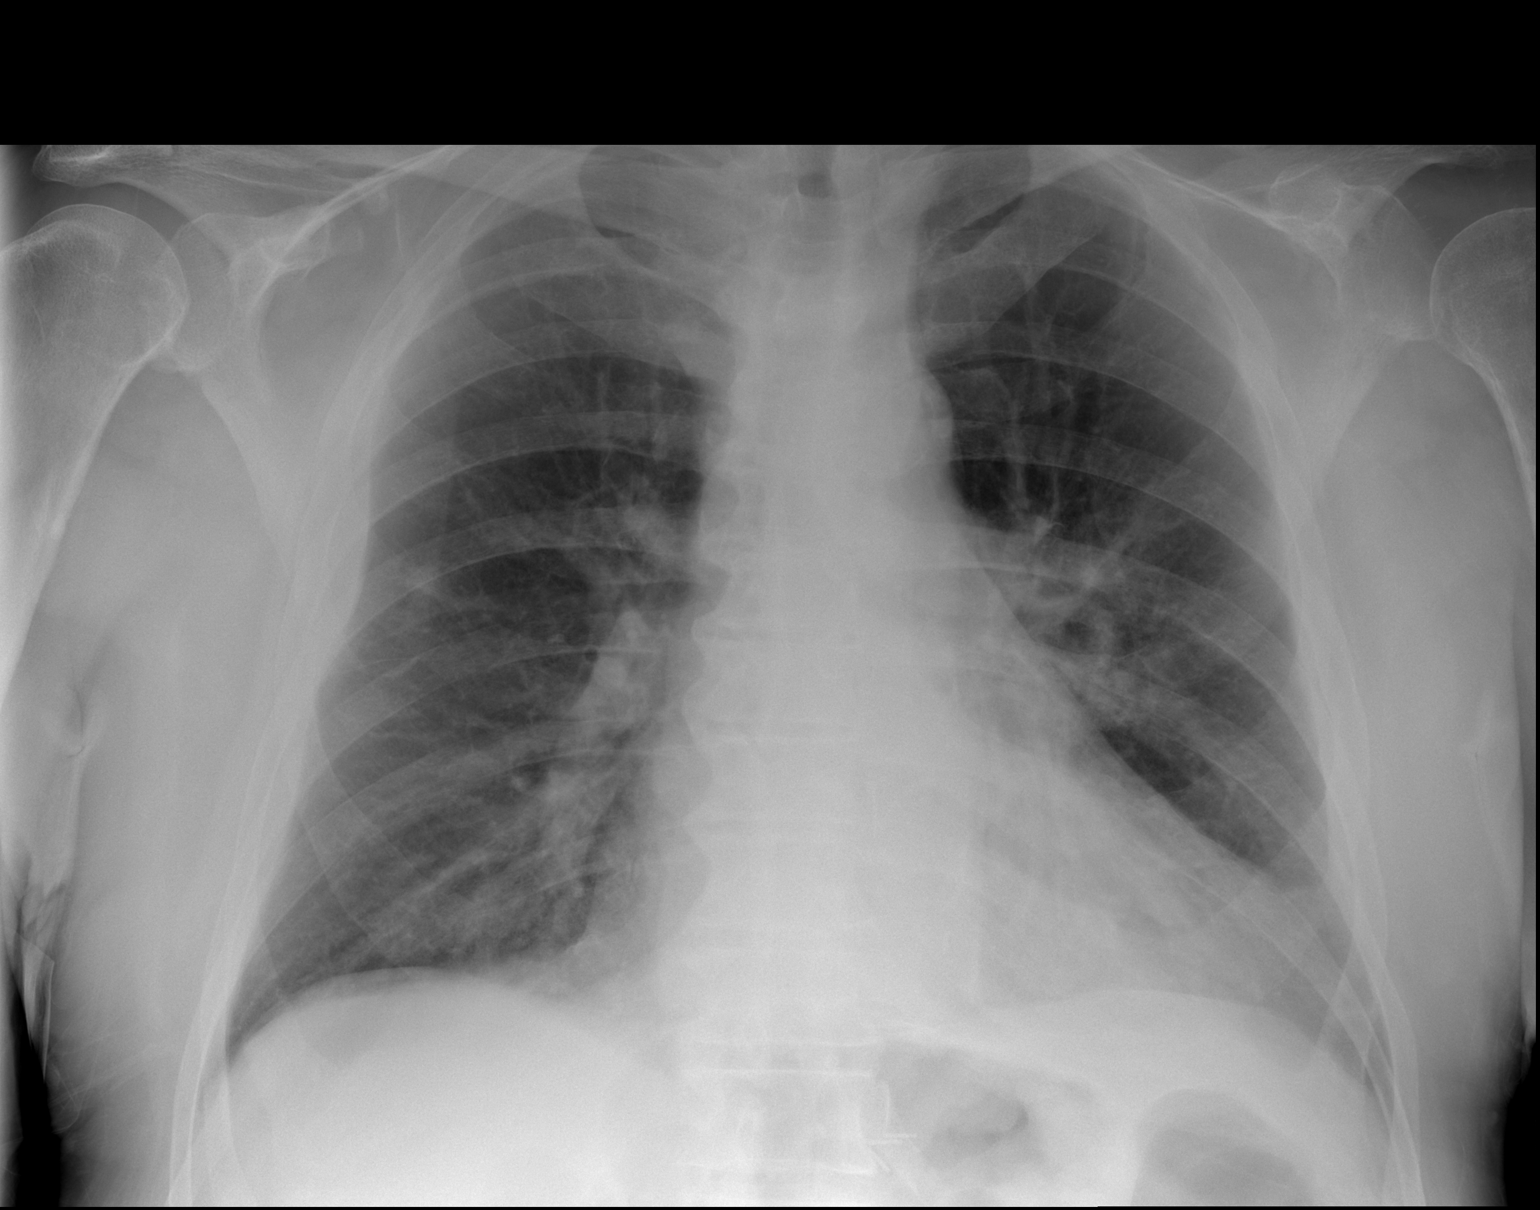

[2 of 2 positions shown; findings below may reference images not displayed]

FINDINGS: There is mild bilateral chronic interstitial thickening. There is no
focal parenchymal opacity. There is no pleural effusion or
pneumothorax. The heart and mediastinal contours are unremarkable.

The osseous structures are unremarkable.
IMPRESSION: No active cardiopulmonary disease.

## 2018-12-21 DIAGNOSIS — Z139 Encounter for screening, unspecified: Secondary | ICD-10-CM | POA: Diagnosis not present

## 2018-12-21 DIAGNOSIS — I509 Heart failure, unspecified: Secondary | ICD-10-CM | POA: Diagnosis not present

## 2018-12-23 DIAGNOSIS — G4701 Insomnia due to medical condition: Secondary | ICD-10-CM | POA: Diagnosis not present

## 2018-12-26 NOTE — Progress Notes (Incomplete)
°  Patient Name: [name] MRN: [id number] DOB: Aug 16, 1950 Referring Physician: Roosevelt Locks Date of Service: 12/06/2018  [facility] [place]                                                        End Of Treatment Note  Diagnoses: C34.11-Malignant neoplasm of upper lobe, right bronchus or lung  Cancer Staging: Suspicious stage IA non-small cell lung cancer presented with right upper lobe pulmonary nodule, pending tissue diagnosis and staging work-up.  Intent: Curative  Radiation Treatment Dates: 11/29/2018 through 12/06/2018 Site Technique Total Dose (Gy) Dose per Fx (Gy) Completed Fx Beam Energies  Thorax: Lung_Rt IMRT 54/54 18 3/3 [id number]   Narrative: The patient tolerated radiation therapy relatively well. No acute side effects noted.  Plan: The patient will follow-up with radiation oncology in one month.  ________________________________________________  Blair Promise, PhD, MD  This document serves as a record of services personally performed by Gery Pray, MD. It was created on his behalf by Clerance Lav, a trained medical scribe. The creation of this record is based on the scribe's personal observations and the provider's statements to them. This document has been checked and approved by the attending provider.

## 2018-12-29 ENCOUNTER — Ambulatory Visit: Payer: Medicare Other | Admitting: Gastroenterology

## 2019-01-03 DIAGNOSIS — I69354 Hemiplegia and hemiparesis following cerebral infarction affecting left non-dominant side: Secondary | ICD-10-CM | POA: Diagnosis not present

## 2019-01-03 DIAGNOSIS — R5381 Other malaise: Secondary | ICD-10-CM | POA: Diagnosis not present

## 2019-01-03 DIAGNOSIS — R5383 Other fatigue: Secondary | ICD-10-CM | POA: Diagnosis not present

## 2019-01-03 DIAGNOSIS — I6930 Unspecified sequelae of cerebral infarction: Secondary | ICD-10-CM | POA: Diagnosis not present

## 2019-01-03 DIAGNOSIS — R600 Localized edema: Secondary | ICD-10-CM | POA: Diagnosis not present

## 2019-01-09 DIAGNOSIS — C3490 Malignant neoplasm of unspecified part of unspecified bronchus or lung: Secondary | ICD-10-CM | POA: Diagnosis not present

## 2019-01-09 DIAGNOSIS — R5381 Other malaise: Secondary | ICD-10-CM | POA: Diagnosis not present

## 2019-01-09 DIAGNOSIS — M6281 Muscle weakness (generalized): Secondary | ICD-10-CM | POA: Diagnosis not present

## 2019-01-12 DIAGNOSIS — I6930 Unspecified sequelae of cerebral infarction: Secondary | ICD-10-CM | POA: Diagnosis not present

## 2019-01-12 DIAGNOSIS — I480 Paroxysmal atrial fibrillation: Secondary | ICD-10-CM | POA: Diagnosis not present

## 2019-01-12 DIAGNOSIS — E785 Hyperlipidemia, unspecified: Secondary | ICD-10-CM | POA: Diagnosis not present

## 2019-01-12 DIAGNOSIS — I5032 Chronic diastolic (congestive) heart failure: Secondary | ICD-10-CM | POA: Diagnosis not present

## 2019-01-15 IMAGING — CT CT HEAD W/O CM
4 series · 15 of 47 positions shown, 17 images · non-contrast
Comparison: CT scan of August 25, 2016.

CLINICAL DATA: Dizziness, fall yesterday.

EXAM:
CT HEAD WITHOUT CONTRAST
CT CERVICAL SPINE WITHOUT CONTRAST
TECHNIQUE: Multidetector CT imaging of the head and cervical spine was
performed following the standard protocol without intravenous
contrast. Multiplanar CT image reconstructions of the cervical spine
were also generated.

[Series 3: head without · axial · non-contrast · 0.49mm/px · z∈[-54,+66]mm · 7 of 33 slices shown, 9 images]
[im 5/33  brain]
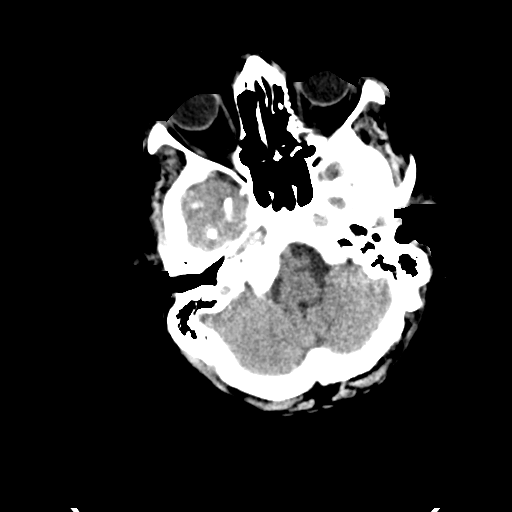
[im 5/33  bone]
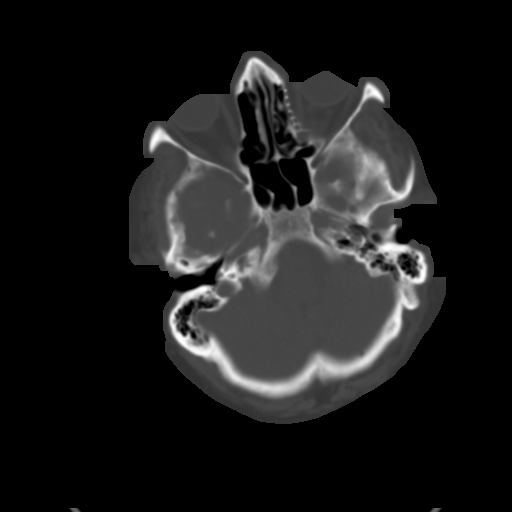
[im 9/33  brain]
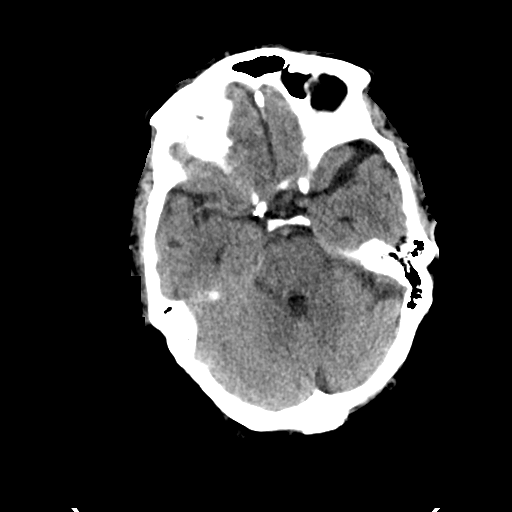
[im 13/33  brain]
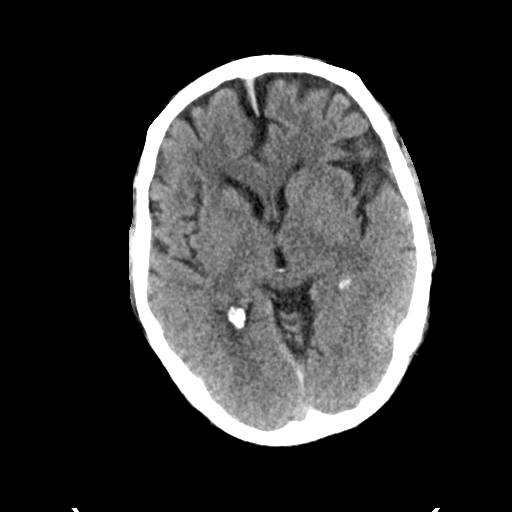
[im 17/33  brain]
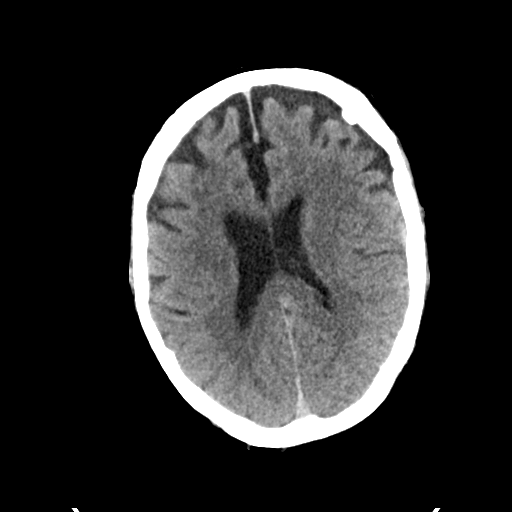
[im 21/33  brain]
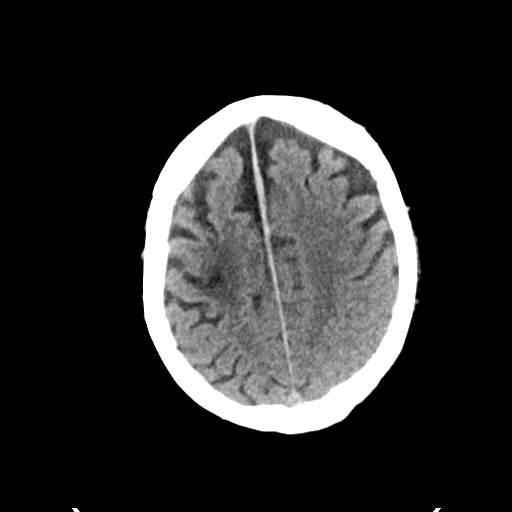
[im 21/33  bone]
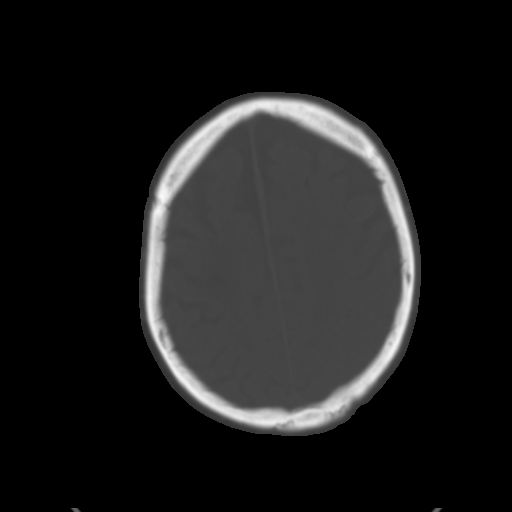
[im 25/33  brain]
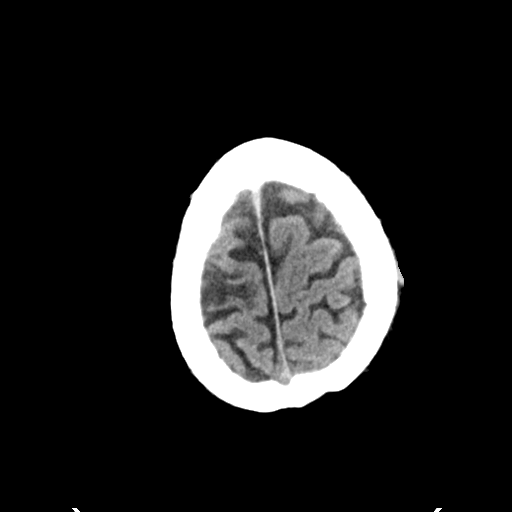
[im 29/33  brain]
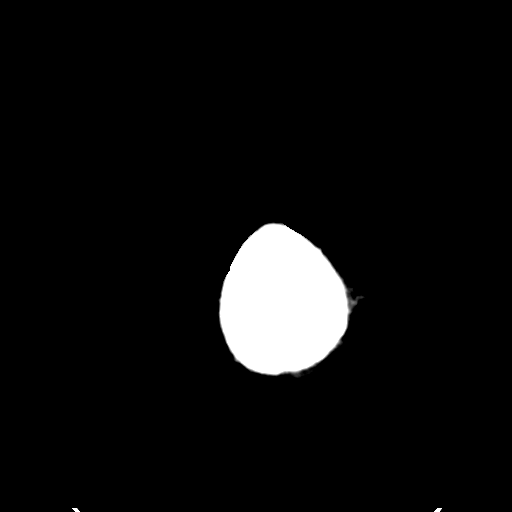

[Series 4: head without sag · sagittal · non-contrast · 0.32mm/px · 3 of 63 slices shown]
[im 22/63  brain]
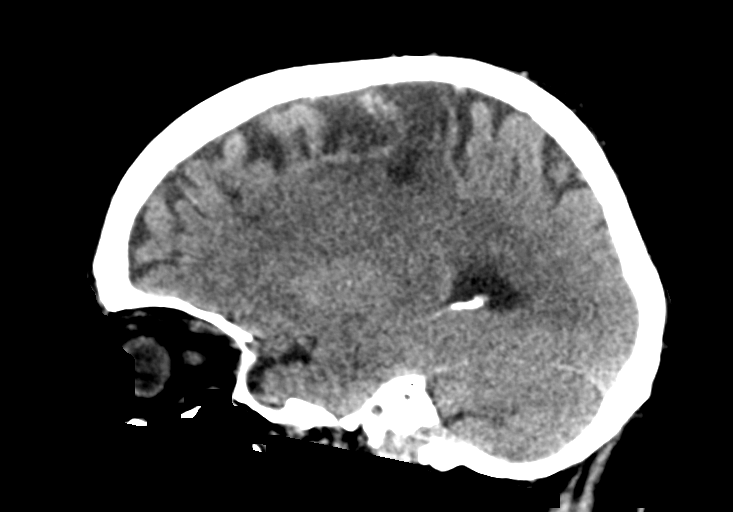
[im 32/63  brain]
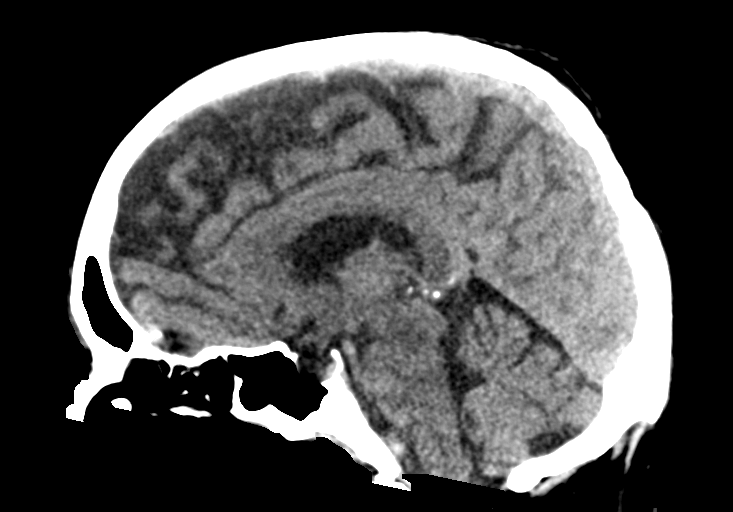
[im 42/63  brain]
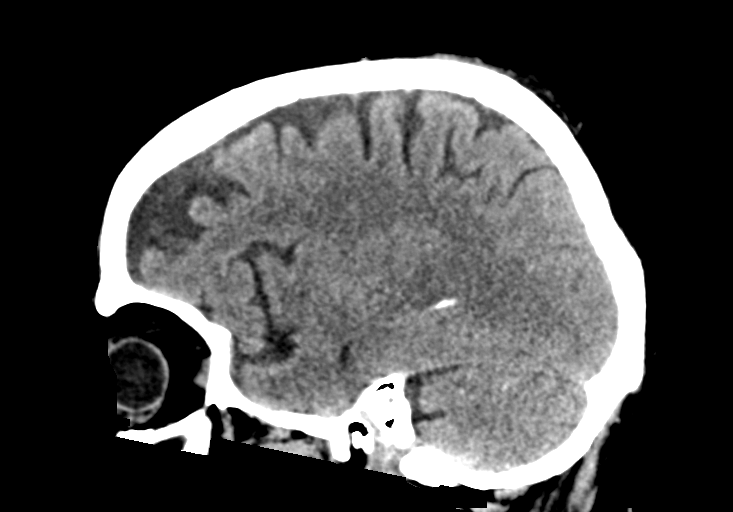

[Series 5: head bone · axial · 0.49mm/px · z∈[-57,-41]mm · 2 of 81 slices shown]
[im 9/81  bone]
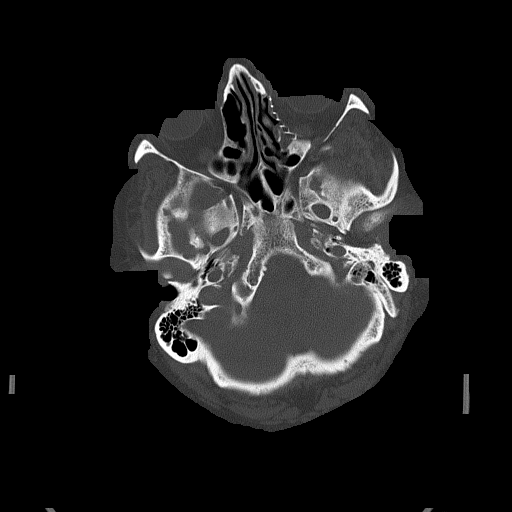
[im 17/81  bone]
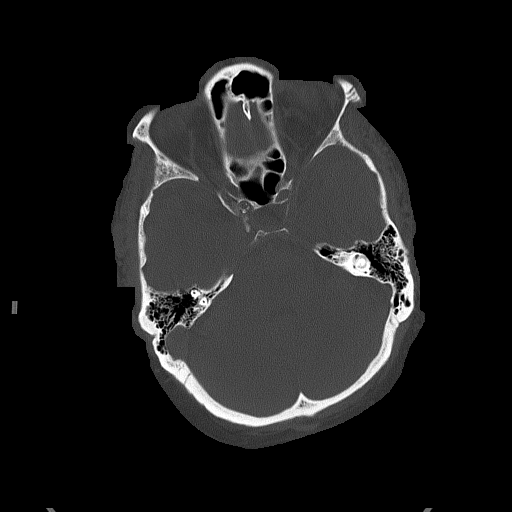

[Series 6: head without cor · coronal · non-contrast · 0.31mm/px · 3 of 73 slices shown]
[im 25/73  brain]
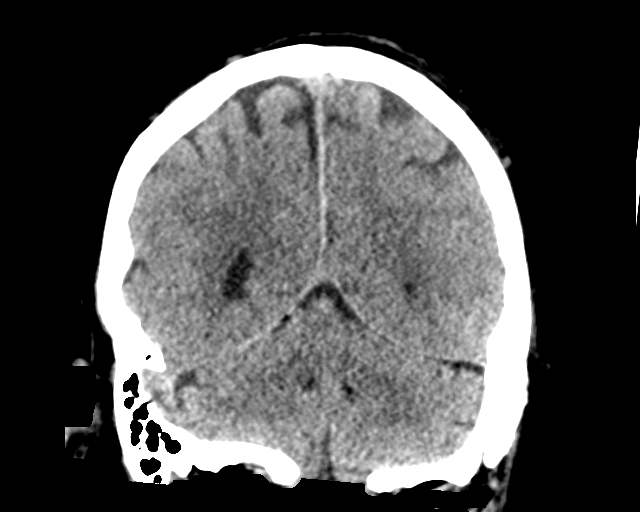
[im 33/73  brain]
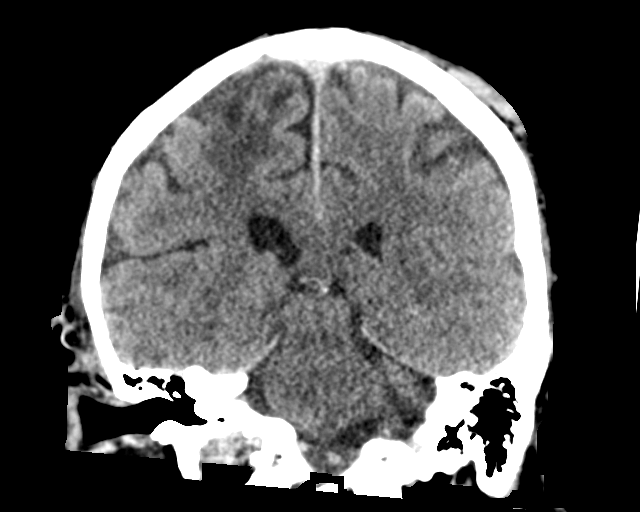
[im 41/73  brain]
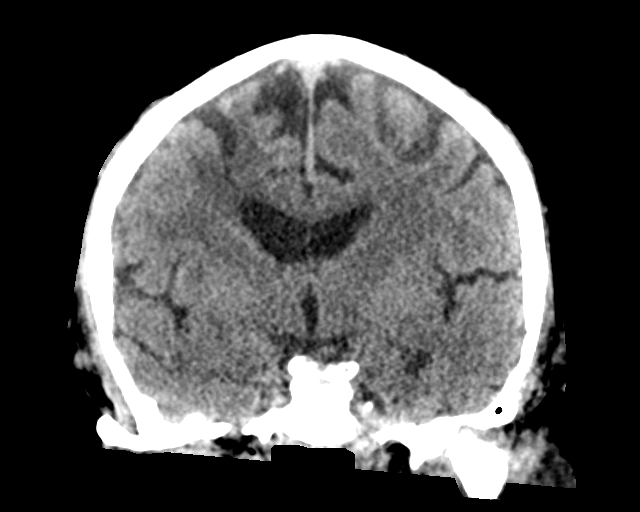

[15 of 47 positions shown; findings below may reference images not displayed]

FINDINGS: CT HEAD FINDINGS

Brain: Mild diffuse cortical atrophy is noted. Stable right parietal
encephalomalacia consistent with old infarction. Mild chronic
ischemic white matter disease is noted. No mass effect or midline
shift is noted. Ventricular size is within normal limits. There is
no evidence of mass lesion, hemorrhage or acute infarction.

Vascular: No hyperdense vessel or unexpected calcification.

Skull: Normal. Negative for fracture or focal lesion.

Sinuses/Orbits: Mucosal thickening and postsurgical changes are seen
involving the left orbital floor and maxillary sinus.

Other: Small left parietal scalp hematoma is noted.

CT CERVICAL SPINE FINDINGS

Alignment: Normal.

Skull base and vertebrae: No acute fracture. No primary bone lesion
or focal pathologic process.

Soft tissues and spinal canal: No prevertebral fluid or swelling. No
visible canal hematoma.

Disc levels: Fusion of the C2-3 disc space is noted most likely due
to degenerative change. Severe degenerative disc disease is noted at
C5-6 with anterior osteophyte formation. Anterior osteophyte
formation is also noted at C3-4 C4-5 and C5-6.

Upper chest: Negative.

Other: Degenerative changes seen involving posterior facet joints
bilaterally.
IMPRESSION: Mild diffuse cortical atrophy. Old right parietal infarction. Mild
chronic ischemic white matter disease. Small left parietal scalp
hematoma. No acute intracranial abnormality seen.

Multilevel degenerative disc disease. No acute abnormality seen in
the cervical spine.

## 2019-01-15 IMAGING — CT CT CERVICAL SPINE W/O CM
3 of 5 series · 12 of 33 positions shown, 14 images · non-contrast
Comparison: CT scan of August 25, 2016.

CLINICAL DATA: Dizziness, fall yesterday.

EXAM:
CT HEAD WITHOUT CONTRAST
CT CERVICAL SPINE WITHOUT CONTRAST
TECHNIQUE: Multidetector CT imaging of the head and cervical spine was
performed following the standard protocol without intravenous
contrast. Multiplanar CT image reconstructions of the cervical spine
were also generated.

[Series 5: c_spine 2.0 st · axial · 0.30mm/px · z∈[-238,-110]mm · 4 of 96 slices shown, 5 images]
[im 16/96  soft-tissue]
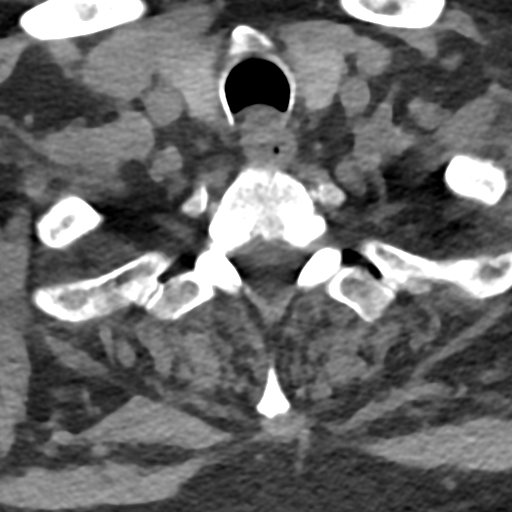
[im 16/96  bone]
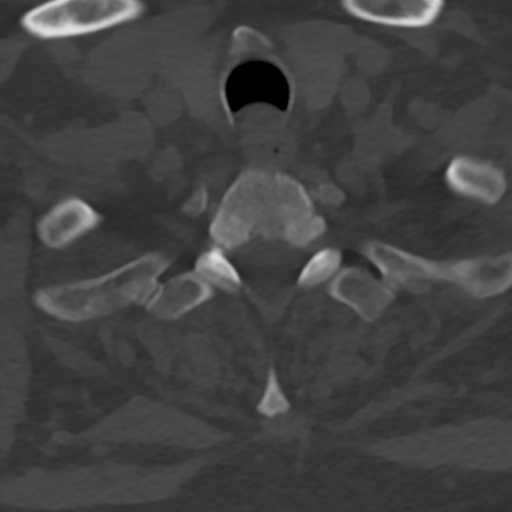
[im 32/96  bone]
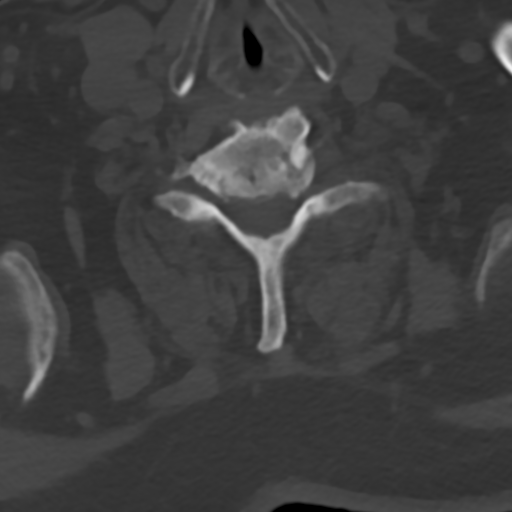
[im 64/96  bone]
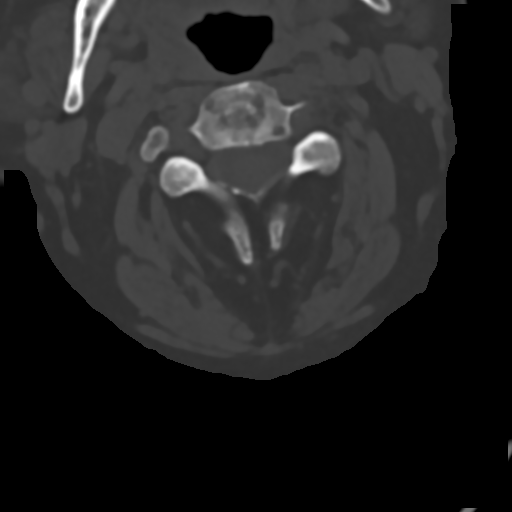
[im 80/96  bone]
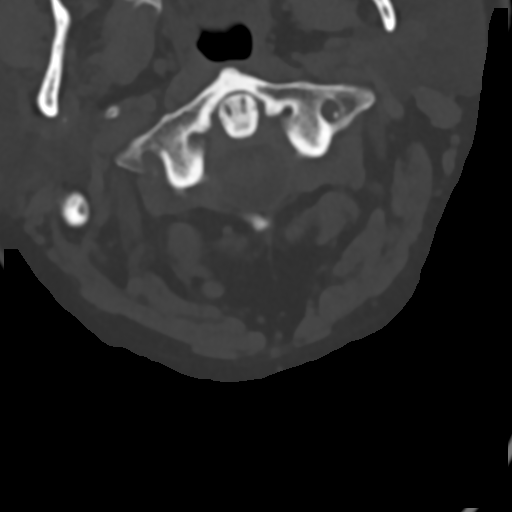

[Series 7: c_spine 2.0 sag bone · sagittal · 0.26mm/px · 5 of 82 slices shown, 6 images]
[im 28/82  bone]
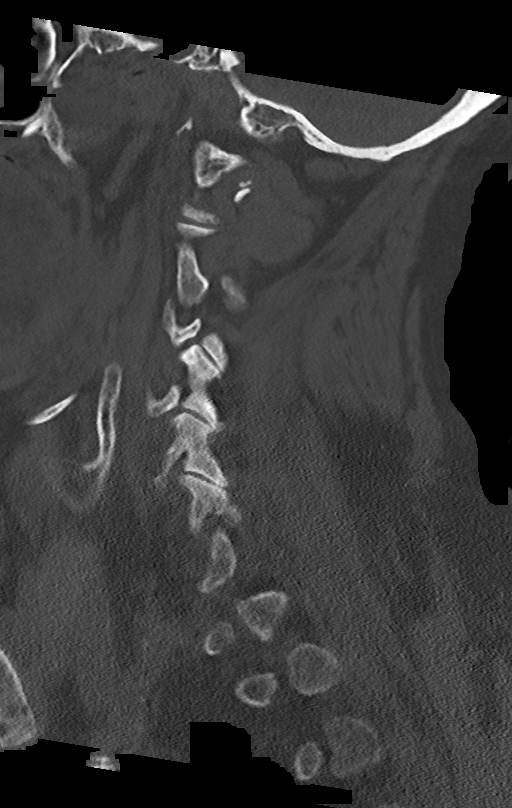
[im 34/82  bone]
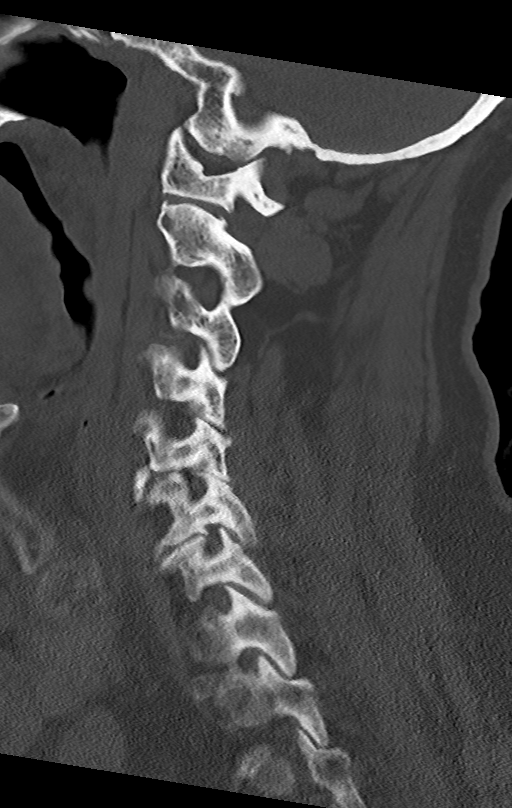
[im 41/82  soft-tissue]
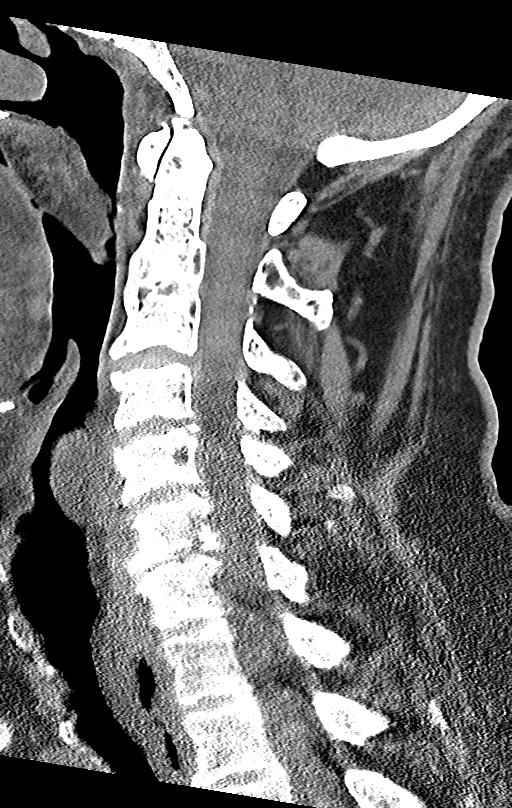
[im 41/82  bone]
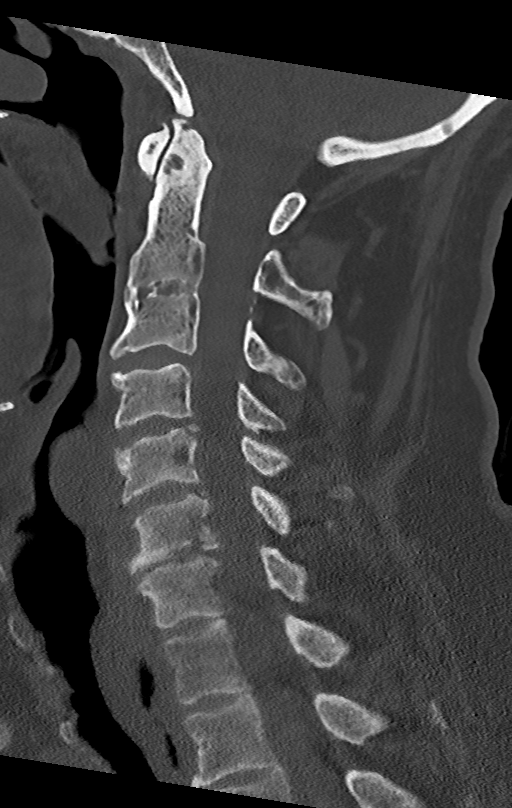
[im 48/82  bone]
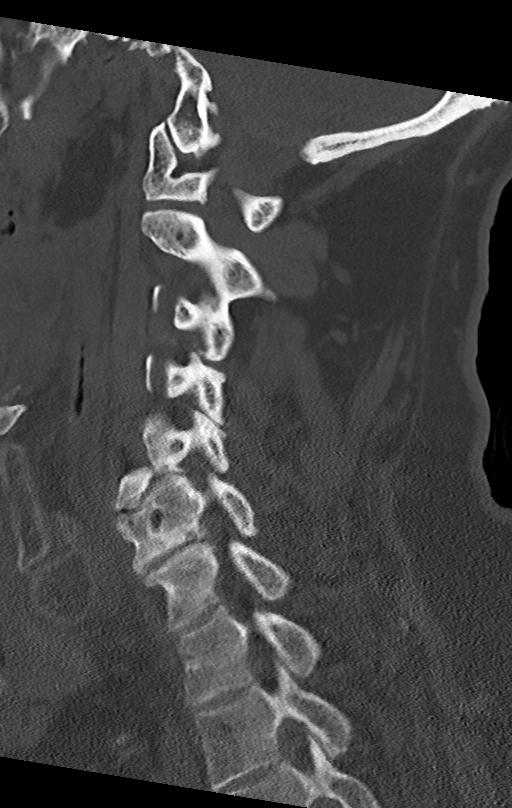
[im 55/82  bone]
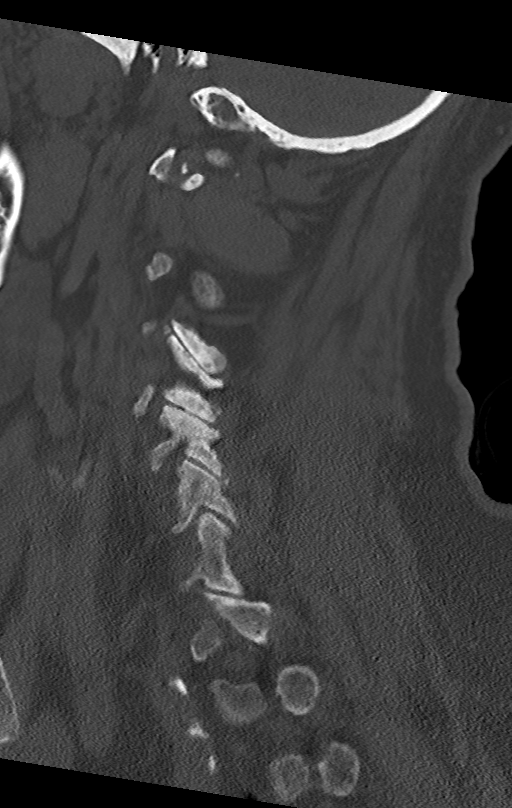

[Series 8: c_spine 2.0 cor bone · coronal · 0.28mm/px · 3 of 61 slices shown]
[im 13/61  bone]
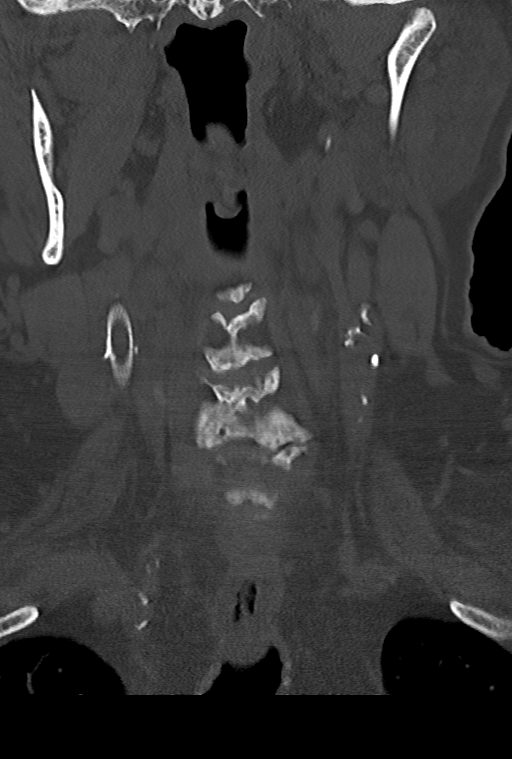
[im 25/61  bone]
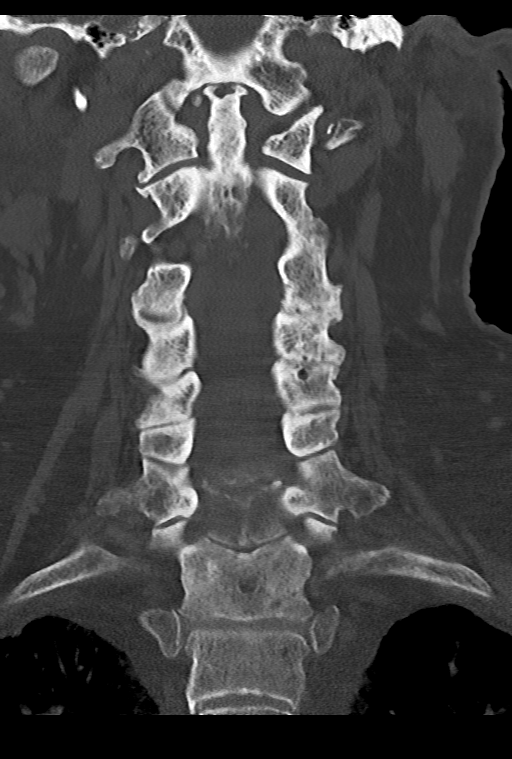
[im 37/61  bone]
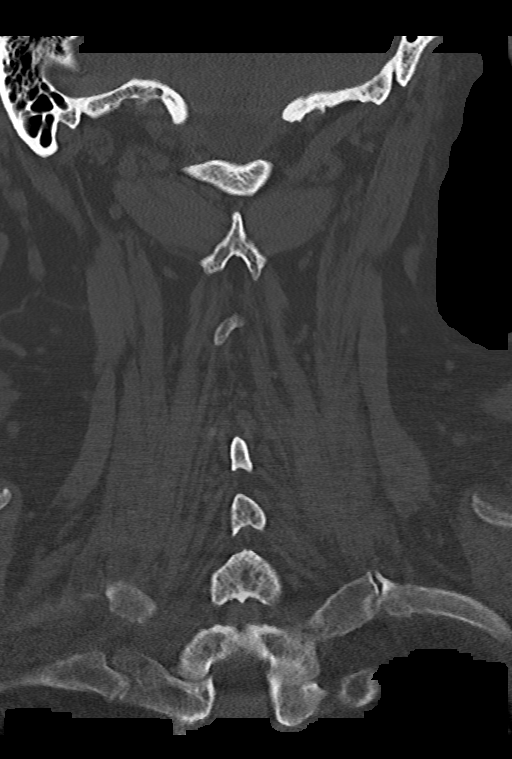

[12 of 33 positions shown; findings below may reference images not displayed]

FINDINGS: CT HEAD FINDINGS

Brain: Mild diffuse cortical atrophy is noted. Stable right parietal
encephalomalacia consistent with old infarction. Mild chronic
ischemic white matter disease is noted. No mass effect or midline
shift is noted. Ventricular size is within normal limits. There is
no evidence of mass lesion, hemorrhage or acute infarction.

Vascular: No hyperdense vessel or unexpected calcification.

Skull: Normal. Negative for fracture or focal lesion.

Sinuses/Orbits: Mucosal thickening and postsurgical changes are seen
involving the left orbital floor and maxillary sinus.

Other: Small left parietal scalp hematoma is noted.

CT CERVICAL SPINE FINDINGS

Alignment: Normal.

Skull base and vertebrae: No acute fracture. No primary bone lesion
or focal pathologic process.

Soft tissues and spinal canal: No prevertebral fluid or swelling. No
visible canal hematoma.

Disc levels: Fusion of the C2-3 disc space is noted most likely due
to degenerative change. Severe degenerative disc disease is noted at
C5-6 with anterior osteophyte formation. Anterior osteophyte
formation is also noted at C3-4 C4-5 and C5-6.

Upper chest: Negative.

Other: Degenerative changes seen involving posterior facet joints
bilaterally.
IMPRESSION: Mild diffuse cortical atrophy. Old right parietal infarction. Mild
chronic ischemic white matter disease. Small left parietal scalp
hematoma. No acute intracranial abnormality seen.

Multilevel degenerative disc disease. No acute abnormality seen in
the cervical spine.

## 2019-01-15 IMAGING — DX DG CHEST 2V
2 series · 2 of 2 positions shown · non-contrast
Comparison: Radiographs March 17, 2017.

CLINICAL DATA: Syncope, fall today.

EXAM:
CHEST  2 VIEW

[chest lat]
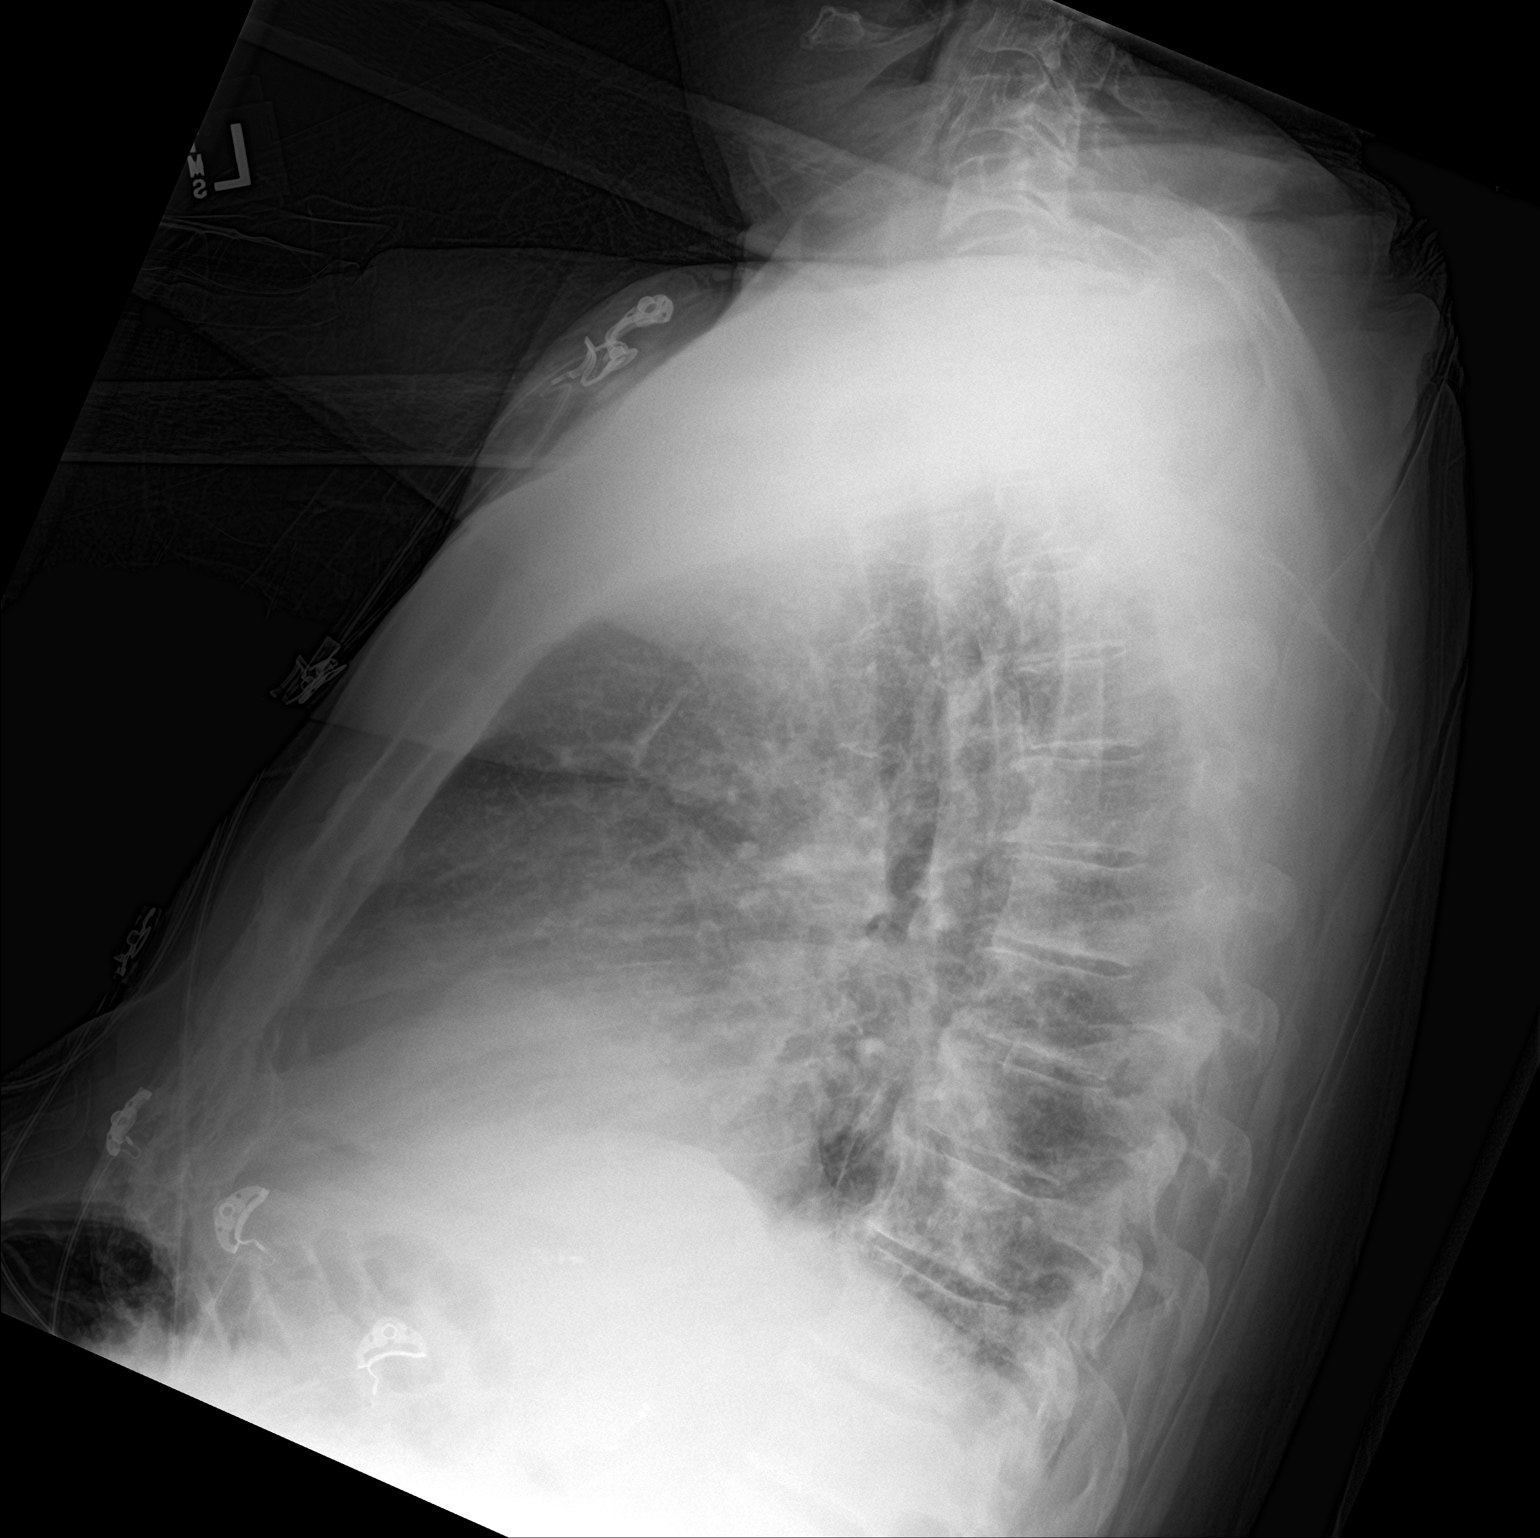

[chest ap]
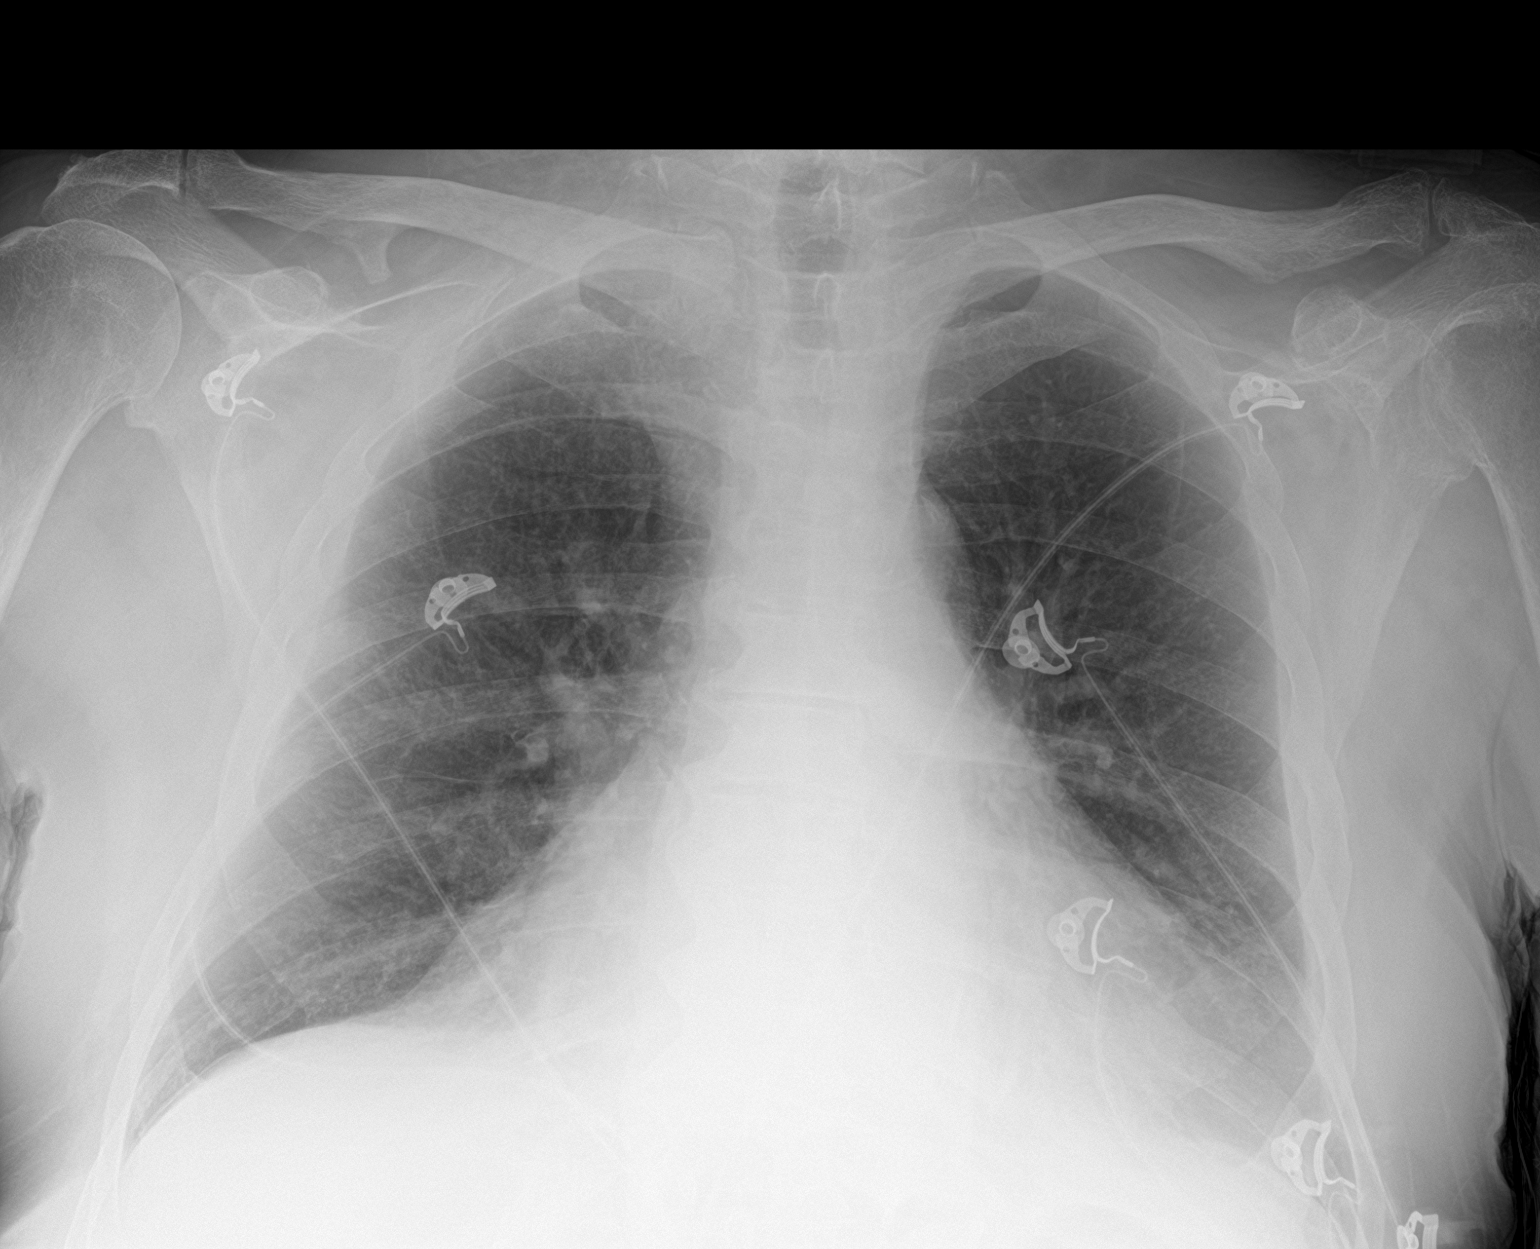

[2 of 2 positions shown; findings below may reference images not displayed]

FINDINGS: Stable cardiomegaly. No pneumothorax or pleural effusion is noted.
No acute pulmonary disease is noted. Bony thorax is unremarkable.
IMPRESSION: No active cardiopulmonary disease.

## 2019-01-16 DIAGNOSIS — G8194 Hemiplegia, unspecified affecting left nondominant side: Secondary | ICD-10-CM | POA: Diagnosis not present

## 2019-01-16 DIAGNOSIS — M6281 Muscle weakness (generalized): Secondary | ICD-10-CM | POA: Diagnosis not present

## 2019-01-16 DIAGNOSIS — I69354 Hemiplegia and hemiparesis following cerebral infarction affecting left non-dominant side: Secondary | ICD-10-CM | POA: Diagnosis not present

## 2019-01-17 DIAGNOSIS — M6281 Muscle weakness (generalized): Secondary | ICD-10-CM | POA: Diagnosis not present

## 2019-01-17 DIAGNOSIS — G8194 Hemiplegia, unspecified affecting left nondominant side: Secondary | ICD-10-CM | POA: Diagnosis not present

## 2019-01-17 DIAGNOSIS — I69354 Hemiplegia and hemiparesis following cerebral infarction affecting left non-dominant side: Secondary | ICD-10-CM | POA: Diagnosis not present

## 2019-01-18 DIAGNOSIS — M6281 Muscle weakness (generalized): Secondary | ICD-10-CM | POA: Diagnosis not present

## 2019-01-18 DIAGNOSIS — G8194 Hemiplegia, unspecified affecting left nondominant side: Secondary | ICD-10-CM | POA: Diagnosis not present

## 2019-01-18 DIAGNOSIS — I69354 Hemiplegia and hemiparesis following cerebral infarction affecting left non-dominant side: Secondary | ICD-10-CM | POA: Diagnosis not present

## 2019-01-19 DIAGNOSIS — M6281 Muscle weakness (generalized): Secondary | ICD-10-CM | POA: Diagnosis not present

## 2019-01-19 DIAGNOSIS — G8194 Hemiplegia, unspecified affecting left nondominant side: Secondary | ICD-10-CM | POA: Diagnosis not present

## 2019-01-19 DIAGNOSIS — I69354 Hemiplegia and hemiparesis following cerebral infarction affecting left non-dominant side: Secondary | ICD-10-CM | POA: Diagnosis not present

## 2019-01-20 DIAGNOSIS — M6281 Muscle weakness (generalized): Secondary | ICD-10-CM | POA: Diagnosis not present

## 2019-01-20 DIAGNOSIS — I69354 Hemiplegia and hemiparesis following cerebral infarction affecting left non-dominant side: Secondary | ICD-10-CM | POA: Diagnosis not present

## 2019-01-20 DIAGNOSIS — G8194 Hemiplegia, unspecified affecting left nondominant side: Secondary | ICD-10-CM | POA: Diagnosis not present

## 2019-01-23 DIAGNOSIS — M6281 Muscle weakness (generalized): Secondary | ICD-10-CM | POA: Diagnosis not present

## 2019-01-23 DIAGNOSIS — G8194 Hemiplegia, unspecified affecting left nondominant side: Secondary | ICD-10-CM | POA: Diagnosis not present

## 2019-01-23 DIAGNOSIS — I69354 Hemiplegia and hemiparesis following cerebral infarction affecting left non-dominant side: Secondary | ICD-10-CM | POA: Diagnosis not present

## 2019-01-24 DIAGNOSIS — M6281 Muscle weakness (generalized): Secondary | ICD-10-CM | POA: Diagnosis not present

## 2019-01-24 DIAGNOSIS — I69354 Hemiplegia and hemiparesis following cerebral infarction affecting left non-dominant side: Secondary | ICD-10-CM | POA: Diagnosis not present

## 2019-01-24 DIAGNOSIS — G4701 Insomnia due to medical condition: Secondary | ICD-10-CM | POA: Diagnosis not present

## 2019-01-24 DIAGNOSIS — G8194 Hemiplegia, unspecified affecting left nondominant side: Secondary | ICD-10-CM | POA: Diagnosis not present

## 2019-01-25 DIAGNOSIS — I69354 Hemiplegia and hemiparesis following cerebral infarction affecting left non-dominant side: Secondary | ICD-10-CM | POA: Diagnosis not present

## 2019-01-25 DIAGNOSIS — G8194 Hemiplegia, unspecified affecting left nondominant side: Secondary | ICD-10-CM | POA: Diagnosis not present

## 2019-01-25 DIAGNOSIS — L209 Atopic dermatitis, unspecified: Secondary | ICD-10-CM | POA: Diagnosis not present

## 2019-01-25 DIAGNOSIS — L299 Pruritus, unspecified: Secondary | ICD-10-CM | POA: Diagnosis not present

## 2019-01-25 DIAGNOSIS — S81802A Unspecified open wound, left lower leg, initial encounter: Secondary | ICD-10-CM | POA: Diagnosis not present

## 2019-01-25 DIAGNOSIS — R21 Rash and other nonspecific skin eruption: Secondary | ICD-10-CM | POA: Diagnosis not present

## 2019-01-25 DIAGNOSIS — M6281 Muscle weakness (generalized): Secondary | ICD-10-CM | POA: Diagnosis not present

## 2019-01-26 ENCOUNTER — Other Ambulatory Visit: Payer: Self-pay

## 2019-01-26 DIAGNOSIS — M6281 Muscle weakness (generalized): Secondary | ICD-10-CM | POA: Diagnosis not present

## 2019-01-26 DIAGNOSIS — I69354 Hemiplegia and hemiparesis following cerebral infarction affecting left non-dominant side: Secondary | ICD-10-CM | POA: Diagnosis not present

## 2019-01-26 DIAGNOSIS — G8194 Hemiplegia, unspecified affecting left nondominant side: Secondary | ICD-10-CM | POA: Diagnosis not present

## 2019-01-26 NOTE — Patient Outreach (Signed)
[place] [facility]) Care Management  01/26/2019  [name] 1950/08/01 [id number]   Medication Adherence call to [name] ,patient is at a home care facility at this time  and they are providing with all his medications. [name] is showing past due on Atorvastatin 80 mg under [place].  [place] Management Direct Dial [phone]  Fax [phone] [name]

## 2019-01-27 DIAGNOSIS — L209 Atopic dermatitis, unspecified: Secondary | ICD-10-CM | POA: Diagnosis not present

## 2019-01-27 DIAGNOSIS — I69354 Hemiplegia and hemiparesis following cerebral infarction affecting left non-dominant side: Secondary | ICD-10-CM | POA: Diagnosis not present

## 2019-01-27 DIAGNOSIS — M6281 Muscle weakness (generalized): Secondary | ICD-10-CM | POA: Diagnosis not present

## 2019-01-27 DIAGNOSIS — G8194 Hemiplegia, unspecified affecting left nondominant side: Secondary | ICD-10-CM | POA: Diagnosis not present

## 2019-01-27 DIAGNOSIS — S81802D Unspecified open wound, left lower leg, subsequent encounter: Secondary | ICD-10-CM | POA: Diagnosis not present

## 2019-01-27 DIAGNOSIS — L299 Pruritus, unspecified: Secondary | ICD-10-CM | POA: Diagnosis not present

## 2019-01-27 DIAGNOSIS — R21 Rash and other nonspecific skin eruption: Secondary | ICD-10-CM | POA: Diagnosis not present

## 2019-02-07 DIAGNOSIS — R0989 Other specified symptoms and signs involving the circulatory and respiratory systems: Secondary | ICD-10-CM | POA: Diagnosis not present

## 2019-02-07 DIAGNOSIS — R05 Cough: Secondary | ICD-10-CM | POA: Diagnosis not present

## 2019-02-07 DIAGNOSIS — R5381 Other malaise: Secondary | ICD-10-CM | POA: Diagnosis not present

## 2019-02-08 DIAGNOSIS — I10 Essential (primary) hypertension: Secondary | ICD-10-CM | POA: Diagnosis not present

## 2019-02-08 DIAGNOSIS — R799 Abnormal finding of blood chemistry, unspecified: Secondary | ICD-10-CM | POA: Diagnosis not present

## 2019-02-08 DIAGNOSIS — R300 Dysuria: Secondary | ICD-10-CM | POA: Diagnosis not present

## 2019-02-08 DIAGNOSIS — R0989 Other specified symptoms and signs involving the circulatory and respiratory systems: Secondary | ICD-10-CM | POA: Diagnosis not present

## 2019-02-08 DIAGNOSIS — Z139 Encounter for screening, unspecified: Secondary | ICD-10-CM | POA: Diagnosis not present

## 2019-02-08 DIAGNOSIS — J9811 Atelectasis: Secondary | ICD-10-CM | POA: Diagnosis not present

## 2019-02-08 DIAGNOSIS — D649 Anemia, unspecified: Secondary | ICD-10-CM | POA: Diagnosis not present

## 2019-02-09 DIAGNOSIS — R05 Cough: Secondary | ICD-10-CM | POA: Diagnosis not present

## 2019-02-09 DIAGNOSIS — R0989 Other specified symptoms and signs involving the circulatory and respiratory systems: Secondary | ICD-10-CM | POA: Diagnosis not present

## 2019-02-09 DIAGNOSIS — J189 Pneumonia, unspecified organism: Secondary | ICD-10-CM | POA: Diagnosis not present

## 2019-02-13 DIAGNOSIS — J441 Chronic obstructive pulmonary disease with (acute) exacerbation: Secondary | ICD-10-CM | POA: Diagnosis not present

## 2019-02-13 DIAGNOSIS — R05 Cough: Secondary | ICD-10-CM | POA: Diagnosis not present

## 2019-02-13 DIAGNOSIS — R0989 Other specified symptoms and signs involving the circulatory and respiratory systems: Secondary | ICD-10-CM | POA: Diagnosis not present

## 2019-02-13 DIAGNOSIS — R600 Localized edema: Secondary | ICD-10-CM | POA: Diagnosis not present

## 2019-03-10 DIAGNOSIS — G4701 Insomnia due to medical condition: Secondary | ICD-10-CM | POA: Diagnosis not present

## 2019-03-31 DIAGNOSIS — G4701 Insomnia due to medical condition: Secondary | ICD-10-CM | POA: Diagnosis not present

## 2019-03-31 DIAGNOSIS — E1140 Type 2 diabetes mellitus with diabetic neuropathy, unspecified: Secondary | ICD-10-CM | POA: Diagnosis not present

## 2019-03-31 DIAGNOSIS — J441 Chronic obstructive pulmonary disease with (acute) exacerbation: Secondary | ICD-10-CM | POA: Diagnosis not present

## 2019-03-31 DIAGNOSIS — R05 Cough: Secondary | ICD-10-CM | POA: Diagnosis not present

## 2019-03-31 DIAGNOSIS — G4700 Insomnia, unspecified: Secondary | ICD-10-CM | POA: Diagnosis not present

## 2019-03-31 DIAGNOSIS — K5900 Constipation, unspecified: Secondary | ICD-10-CM | POA: Diagnosis not present

## 2019-04-03 DIAGNOSIS — J441 Chronic obstructive pulmonary disease with (acute) exacerbation: Secondary | ICD-10-CM | POA: Diagnosis not present

## 2019-04-03 DIAGNOSIS — R05 Cough: Secondary | ICD-10-CM | POA: Diagnosis not present

## 2019-04-03 DIAGNOSIS — C3490 Malignant neoplasm of unspecified part of unspecified bronchus or lung: Secondary | ICD-10-CM | POA: Diagnosis not present

## 2019-04-03 DIAGNOSIS — K5900 Constipation, unspecified: Secondary | ICD-10-CM | POA: Diagnosis not present

## 2019-04-07 DIAGNOSIS — G4701 Insomnia due to medical condition: Secondary | ICD-10-CM | POA: Diagnosis not present

## 2019-04-10 ENCOUNTER — Ambulatory Visit (HOSPITAL_COMMUNITY): Admission: RE | Admit: 2019-04-10 | Payer: Medicare Other | Source: Ambulatory Visit

## 2019-04-10 ENCOUNTER — Other Ambulatory Visit: Payer: Medicare Other

## 2019-04-10 ENCOUNTER — Telehealth: Payer: Self-pay | Admitting: *Deleted

## 2019-04-10 NOTE — Telephone Encounter (Signed)
Called home phone for patient which was routed to facility [facility].  Patient resides there in room [address]A.  Spoke with his nurse Wynelle Bourgeois and advised that he missed his CT Chest/labs which will affect his upcoming appt with Dr. Julien Nordmann.  They were unaware that patient had appointment.  He will investigate and return call.

## 2019-04-11 ENCOUNTER — Telehealth: Payer: Self-pay | Admitting: *Deleted

## 2019-04-11 NOTE — Telephone Encounter (Signed)
Attempted twice on 04/10/2019 to reach Tammy at [facility] to coordinate rescheduling of patients CT and visit to review results with Dr. Julien Nordmann.  Unable to reach Tammy.  Attempted again 04/11/2019 once without success.    Left message with Nira Conn at front desk.  She took message and will relay to [facility] requesting a call back. To coordinate visits and scans.

## 2019-04-11 NOTE — Telephone Encounter (Signed)
Spoke with Tammy @ [facility].  She will get CT rescheduled and will call back to reschedule MD follow up to review those results.

## 2019-04-12 ENCOUNTER — Ambulatory Visit: Payer: Medicare Other | Admitting: Internal Medicine

## 2019-04-13 ENCOUNTER — Other Ambulatory Visit: Payer: Self-pay | Admitting: *Deleted

## 2019-04-14 ENCOUNTER — Other Ambulatory Visit: Payer: Self-pay | Admitting: Physician Assistant

## 2019-04-14 DIAGNOSIS — R918 Other nonspecific abnormal finding of lung field: Secondary | ICD-10-CM

## 2019-04-20 ENCOUNTER — Other Ambulatory Visit: Payer: Self-pay

## 2019-04-20 ENCOUNTER — Ambulatory Visit (HOSPITAL_COMMUNITY)
Admission: RE | Admit: 2019-04-20 | Discharge: 2019-04-20 | Disposition: A | Discharge status: Home / Self Care | Payer: Medicare Other | Source: Ambulatory Visit | Attending: Physician Assistant | Admitting: Physician Assistant

## 2019-04-20 ENCOUNTER — Ambulatory Visit (HOSPITAL_COMMUNITY): Admission: RE | Admit: 2019-04-20 | Payer: Medicare Other | Source: Ambulatory Visit

## 2019-04-20 DIAGNOSIS — R918 Other nonspecific abnormal finding of lung field: Secondary | ICD-10-CM | POA: Insufficient documentation

## 2019-04-20 DIAGNOSIS — M2550 Pain in unspecified joint: Secondary | ICD-10-CM | POA: Diagnosis not present

## 2019-04-20 DIAGNOSIS — Z7401 Bed confinement status: Secondary | ICD-10-CM | POA: Diagnosis not present

## 2019-04-20 DIAGNOSIS — J90 Pleural effusion, not elsewhere classified: Secondary | ICD-10-CM | POA: Diagnosis not present

## 2019-04-20 DIAGNOSIS — C3490 Malignant neoplasm of unspecified part of unspecified bronchus or lung: Secondary | ICD-10-CM | POA: Diagnosis not present

## 2019-04-20 DIAGNOSIS — R5381 Other malaise: Secondary | ICD-10-CM | POA: Diagnosis not present

## 2019-04-20 DIAGNOSIS — R0902 Hypoxemia: Secondary | ICD-10-CM | POA: Diagnosis not present

## 2019-04-20 DIAGNOSIS — Z743 Need for continuous supervision: Secondary | ICD-10-CM | POA: Diagnosis not present

## 2019-04-20 LAB — POCT I-STAT CREATININE: Creatinine, Ser: 0.5 mg/dL — ABNORMAL LOW (ref 0.61–1.24)

## 2019-04-20 MED ORDER — IOHEXOL 300 MG/ML  SOLN
75.0000 mL | Freq: Once | INTRAMUSCULAR | Status: AC | PRN
  Administered 2019-04-20: 10:00:00 75 mL via INTRAVENOUS

## 2019-04-26 ENCOUNTER — Encounter: Payer: Self-pay | Admitting: Internal Medicine

## 2019-04-26 ENCOUNTER — Inpatient Hospital Stay: Payer: Medicare Other | Attending: Internal Medicine | Admitting: Internal Medicine

## 2019-04-26 ENCOUNTER — Other Ambulatory Visit: Payer: Self-pay

## 2019-04-26 ENCOUNTER — Encounter: Payer: Self-pay | Admitting: *Deleted

## 2019-04-26 VITALS — BP 146/78 | HR 88 | Temp 98.0°F | Resp 20 | Ht 72.0 in

## 2019-04-26 DIAGNOSIS — I10 Essential (primary) hypertension: Secondary | ICD-10-CM

## 2019-04-26 DIAGNOSIS — C3491 Malignant neoplasm of unspecified part of right bronchus or lung: Secondary | ICD-10-CM | POA: Insufficient documentation

## 2019-04-26 DIAGNOSIS — J90 Pleural effusion, not elsewhere classified: Secondary | ICD-10-CM | POA: Diagnosis not present

## 2019-04-26 DIAGNOSIS — C3490 Malignant neoplasm of unspecified part of unspecified bronchus or lung: Secondary | ICD-10-CM

## 2019-04-26 DIAGNOSIS — Z85118 Personal history of other malignant neoplasm of bronchus and lung: Secondary | ICD-10-CM | POA: Insufficient documentation

## 2019-04-26 NOTE — Progress Notes (Signed)
Med reconciliation- Pt resides at [facility] and rehab. He did not bring a med list with him and he does not know what medications he takes, so I cannot reconcile his meds.

## 2019-04-26 NOTE — Progress Notes (Signed)
Oncology Nurse Navigator Documentation  Oncology Nurse Navigator Flowsheets 04/26/2019  Abnormal Finding Date -  Confirmed Diagnosis Date -  Diagnosis Status -  Planned Course of Treatment -  Phase of Treatment -  Radiation Actual Start Date: -  Radiation Expected End Date: -  Navigator Follow Up Date: -  Navigator Follow Up Reason: -  Navigator Location [facility]  Referral Date to RadOnc/MedOnc -  Navigator Encounter Type Clinic/MDC/I spoke with patient today in clinic.  He is doing ok but does have cough and congestion at times.  He addressed this with Dr. Julien Nordmann.  His treatment plan is observation for now.  I help to explain this plan of care to him.   Telephone -  Treatment Initiated Date -  Patient Visit Type MedOnc  Treatment Phase Follow-up  Barriers/Navigation Needs Education  Education Other  Interventions Education;Psycho-Social Support  Acuity Level 2-Minimal Needs (1-2 Barriers Identified)  Coordination of Care -  Education Method Verbal  Time Spent with Patient 15

## 2019-04-26 NOTE — Progress Notes (Signed)
[place] Telephone:(336) [phone]   Fax:(336) [phone]  OFFICE PROGRESS NOTE  Kenneth Rakes, MD [place] [address]  DIAGNOSIS: Stage IA (T1c, N0, M0) non-small cell lung cancer, adenocarcinoma presented with right upper lobe pulmonary nodule, diagnosed in August 2020.  PRIOR THERAPY: Status post SBRT to the right upper lobe pulmonary nodule under the care of Dr. Sondra Come.  CURRENT THERAPY: Observation.  INTERVAL HISTORY: [name] 69 y.o. male returns to the clinic today for follow-up visit.  The patient continues to complain of increasing fatigue and weakness.  She also has shortness of breath and mild cough.  He spent most of his day in bed.  He denied having any current chest pain or hemoptysis.  He denied having any fever or chills.  He has no nausea, vomiting, diarrhea or constipation.  The patient has no headache or visual changes.  He had repeat CT scan of the chest performed recently and he is here for evaluation and discussion of his scan results.  MEDICAL HISTORY: Past Medical History:  Diagnosis Date  . Acute blood loss anemia   . Acute CVA (cerebrovascular accident) ([place]) 08/27/2014  . Acute encephalopathy 11/28/2017  . Acute ischemic stroke ([place])   . AKI (acute kidney injury) ([place])   . Alcohol abuse    H/O  . Alcohol dependence with withdrawal with complication ([place]) 2/54/2706  . Anxiety   . Arterial ischemic stroke, MCA (middle cerebral artery), right, acute ([place])   . Arthritis   . Asthma   . Binocular vision disorder with diplopia 01/03/2013  . CAD (coronary artery disease) 05/27/10   Cath: severe single vessell CAD left cx midportion obtuse marginal 2 to 3.  . Cerebral infarction due to embolism of right middle cerebral artery ([place])   . Cerebral infarction due to stenosis of right carotid artery ([place]) 11/01/2014  . Cerebral thrombosis with cerebral infarction 07/18/2016  . Cerebrovascular accident (CVA) due to  stenosis of right carotid artery ([place])   . CHF (congestive heart failure) ([place])   . Closed blow-out fracture of floor of orbit ([place]) 01/19/2013  . COPD (chronic obstructive pulmonary disease) ([place])   . COPD (chronic obstructive pulmonary disease) ([place])   . CVA (cerebral vascular accident) ([place]) 08/26/2014  . Dementia due to another medical condition ([place]) 07/12/2015  . Depression   . Diabetes mellitus, type 2 ([place]) 03/13/2015  . Diastolic CHF, acute on chronic (HCC) 11/01/2014  . DOE (dyspnea on exertion) 11/23/2014  . DVT (deep venous thrombosis) ([place]) 02/22/2014  . Embolic stroke ([place])   . Enophthalmos due to trauma 01/19/2013  . Eyelid lesion 01/24/2013  . Fracture of inferior orbital wall ([place]) 01/03/2013  . Fracture of orbital floor ([place]) 01/03/2013  . GERD (gastroesophageal reflux disease)   . H/O total knee replacement 10/24/2014  . HCAP (healthcare-associated pneumonia) 10/15/2015  . Headache around the eyes 08/27/2014  . History of DVT (deep vein thrombosis)   . History of DVT of lower extremity   . Hyperkalemia 10/24/2014  . Hyperlipemia   . Hyperlipidemia   . Hypertension   . Hypoalbuminemia due to protein-calorie malnutrition ([place])   . Hypotension   . Labile blood pressure   . Left shoulder pain 09/28/2017  . Lower extremity edema 02/21/2014  . Medial orbital wall fracture 01/19/2013  . Myocardial infarction ([place]) 2000  . Orbital fracture 12/2012  . OSA on CPAP   . Pain of left eye 01/03/2013  . Peripheral  vascular disease, unspecified ([place])    08/20/10 doppler: increase in right ABI post-op. Left ABI stable. S/P bi-fem bypass surgery  . Pneumonia 04/04/2012  . Right middle cerebral artery stroke ([place]) 01/18/2018  . Seizures ([place])   . Shortness of breath   . Stroke (cerebrum) ([place]) 02/06/2015  . Thrombocytopenia ([place])   . Tobacco abuse   . Trichiasis without entropion 04/16/2013   Overview:  LLL central   . Type 2 diabetes mellitus with peripheral neuropathy ([place]) 03/13/2015   . Urinary incontinence 03/14/2015    ALLERGIES:  is allergic to darvocet [propoxyphene n-acetaminophen]; haldol [haloperidol decanoate]; acetaminophen; metformin and related; and norco [hydrocodone-acetaminophen].  MEDICATIONS:  Current Outpatient Medications  Medication Sig Dispense Refill  . acetaminophen (TYLENOL) 325 MG tablet Take 2 tablets (650 mg total) by mouth every 4 (four) hours as needed for mild pain (or temp > 37.5 C (99.5 F)). (Patient not taking: Reported on 10/24/2018)    . allopurinol (ZYLOPRIM) 300 MG tablet Take 1 tablet (300 mg total) by mouth daily. 30 tablet 3  . atorvastatin (LIPITOR) 80 MG tablet Take 1 tablet (80 mg total) by mouth daily at 6 PM. (Patient taking differently: Take 80 mg by mouth daily. ) 30 tablet 3  . camphor-menthol (SARNA) lotion Apply topically as needed for itching. 222 mL 0  . Cholecalciferol 25 MCG (1000 UT) tablet Take 1,000 Units by mouth daily.    . clopidogrel (PLAVIX) 75 MG tablet Take 1 tablet (75 mg total) by mouth daily. 30 tablet 3  . docusate sodium (COLACE) 100 MG capsule Take 100 mg by mouth daily.    . DULoxetine (CYMBALTA) 60 MG capsule Take 1 capsule (60 mg total) by mouth daily. 30 capsule 3  . ferrous sulfate 325 (65 FE) MG tablet Take 1 tablet (325 mg total) by mouth daily with breakfast.  3  . folic acid (FOLVITE) 1 MG tablet Take 1 tablet (1 mg total) by mouth daily.    [name] guaiFENesin (MUCINEX) 600 MG 12 hr tablet Take 1,200 mg by mouth 2 (two) times daily.    . hydrocortisone 2.5 % cream Apply 1 application topically every 6 (six) hours as needed (itchiness).    . Infant Care Products (DERMACLOUD) CREA Apply 1 application topically 2 (two) times daily. Apply to buttocks    . levETIRAcetam (KEPPRA) 500 MG tablet Take 1 tablet (500 mg total) by mouth 2 (two) times daily. 60 tablet 3  . losartan (COZAAR) 25 MG tablet Take 1 tablet (25 mg total) by mouth daily. 30 tablet 2  . Melatonin 5 MG TABS Take 5 mg by mouth at bedtime.      . memantine (NAMENDA) 10 MG tablet Take 1 tablet (10 mg total) by mouth 2 (two) times daily. 6 tablet 0  . mometasone-formoterol (DULERA) 200-5 MCG/ACT AERO Inhale 2 puffs into the lungs 2 (two) times daily. (Patient not taking: Reported on 10/24/2018)    . pantoprazole (PROTONIX) 40 MG tablet Take 1 tablet (40 mg total) by mouth daily. 30 tablet 3  . polyethylene glycol (MIRALAX / GLYCOLAX) 17 g packet Take 17 g by mouth daily as needed for moderate constipation.    . pregabalin (LYRICA) 150 MG capsule Take 1 capsule (150 mg total) by mouth 2 (two) times daily. 180 capsule 0  . Skin Protectants, Misc. (EUCERIN) cream Apply 1 application topically daily.    [name] tiZANidine (ZANAFLEX) 2 MG tablet Take 2 mg by mouth 3 (three) times daily.    [name]  traZODone (DESYREL) 100 MG tablet Take 100 mg by mouth at bedtime.    Grant Ruts INHUB 250-50 MCG/DOSE AEPB Inhale 1-2 puffs into the lungs daily. (Patient taking differently: Inhale 2 puffs into the lungs daily. ) 1 each 0   No current facility-administered medications for this visit.    SURGICAL HISTORY:  Past Surgical History:  Procedure Laterality Date  . abdoninal ao angio & bifem angio  05/27/10   Patent graft, occluded bil stents with no retrograde flow into the hypogastric arteries. 100% occl left ant. tibial artery, 70% to 80% to 100% stenosis right superficial fem artery above adductor canal. 100 % occl right ant. tibial vessell  . Aortogram w/ PTCA  09/292003  . BACK SURGERY    . BIOPSY  08/26/2018   Procedure: BIOPSY;  Surgeon: Lavena Bullion, DO;  Location: [facility];  Service: Gastroenterology;;  . CARDIAC CATHETERIZATION  05/27/10   severe CAD left cx  . CHOLECYSTECTOMY    . ESOPHAGOGASTRIC FUNDOPLICATION    . ESOPHAGOGASTRODUODENOSCOPY (EGD) WITH PROPOFOL N/A 08/26/2018   Procedure: ESOPHAGOGASTRODUODENOSCOPY (EGD) WITH PROPOFOL;  Surgeon: Lavena Bullion, DO;  Location: [facility];  Service: Gastroenterology;  Laterality: N/A;  .  EYE SURGERY    . FEMORAL BYPASS  08/19/10   Right Fem-Pop  . FLEXIBLE SIGMOIDOSCOPY N/A 08/26/2018   Procedure: FLEXIBLE SIGMOIDOSCOPY;  Surgeon: Lavena Bullion, DO;  Location: [facility];  Service: Gastroenterology;  Laterality: N/A;  . HOT HEMOSTASIS N/A 08/26/2018   Procedure: HOT HEMOSTASIS (ARGON PLASMA COAGULATION/BICAP);  Surgeon: Lavena Bullion, DO;  Location[facility]COPY;  Service: Gastroenterology;  Laterality: N/A;  . IR ANGIO INTRA EXTRACRAN SEL COM CAROTID INNOMINATE BILAT MOD SED  07/20/2016  . IR ANGIO INTRA EXTRACRAN SEL COM CAROTID INNOMINATE BILAT MOD SED  01/14/2018  . IR ANGIO VERTEBRAL SEL SUBCLAVIAN INNOMINATE UNI R MOD SED  07/20/2016  . IR ANGIO VERTEBRAL SEL VERTEBRAL BILAT MOD SED  01/14/2018  . IR ANGIO VERTEBRAL SEL VERTEBRAL UNI L MOD SED  07/20/2016  . IR RADIOLOGIST EVAL & MGMT  05/27/2016  . IR US GUIDE VASC ACCESS RIGHT  01/14/2018  . JOINT REPLACEMENT    . PERIPHERAL VASCULAR CATHETERIZATION N/A 12/10/2014   Procedure: Abdominal Aortogram;  Surgeon: Angelia Mould, MD;  Location: [facility];  Service: Cardiovascular;  Laterality: N/A;  . POLYPECTOMY  08/26/2018   Procedure: POLYPECTOMY;  Surgeon: Lavena Bullion, DO;  Location: [facility];  Service: Gastroenterology;;  . RADIOLOGY WITH ANESTHESIA N/A 02/08/2015   Procedure: RADIOLOGY WITH ANESTHESIA;  Surgeon: Luanne Bras, MD;  Location: [facility];  Service: Radiology;  Laterality: N/A;  . TEE WITHOUT CARDIOVERSION N/A 08/29/2014   Procedure: TRANSESOPHAGEAL ECHOCARDIOGRAM (TEE);  Surgeon: Josue Hector, MD;  [facility]COPY;  Service: Cardiovascular;  Laterality: N/A;  . TOTAL KNEE ARTHROPLASTY      REVIEW OF SYSTEMS:  A comprehensive review of systems was negative except for: Constitutional: positive for fatigue Respiratory: positive for cough and dyspnea on exertion Musculoskeletal: positive for arthralgias and muscle weakness   PHYSICAL EXAMINATION: General appearance:  alert, cooperative, fatigued and no distress Head: Normocephalic, without obvious abnormality, atraumatic Neck: no adenopathy, no JVD, supple, symmetrical, trachea midline and thyroid not enlarged, symmetric, no tenderness/mass/nodules Lymph nodes: Cervical, supraclavicular, and axillary nodes normal. Resp: clear to auscultation bilaterally Back: symmetric, no curvature. ROM normal. No CVA tenderness. Cardio: regular rate and rhythm, S1, S2 normal, no murmur, click, rub or gallop GI: soft, non-tender; bowel sounds normal;  no masses,  no organomegaly Extremities: extremities normal, atraumatic, no cyanosis or edema  ECOG PERFORMANCE STATUS: 1 - Symptomatic but completely ambulatory  Blood pressure (!) 146/78, pulse 88, temperature 98 F (36.7 C), temperature source Temporal, resp. rate 20, height 6' (1.829 m), SpO2 97 %.  LABORATORY DATA: Lab Results  Component Value Date   WBC 4.6 10/27/2018   HGB 13.8 10/27/2018   HCT 44.3 10/27/2018   MCV 90.2 10/27/2018   PLT 150 10/27/2018      Chemistry      Component Value Date/Time   NA 148 (H) 08/26/2018 0626   NA 145 (H) 08/19/2017 0952   K 3.1 (L) 08/26/2018 0626   CL 114 (H) 08/26/2018 0626   CO2 27 08/26/2018 0626   BUN <5 (L) 08/26/2018 0626   BUN 14 08/19/2017 0952   CREATININE 0.50 (L) 04/20/2019 0944   CREATININE 0.83 04/25/2018 1354   CREATININE CANCELED 04/28/2016 1515      Component Value Date/Time   CALCIUM 9.4 08/26/2018 0626   ALKPHOS 67 08/22/2018 1430   AST 15 08/22/2018 1430   AST 17 04/25/2018 1354   ALT 11 08/22/2018 1430   ALT 17 04/25/2018 1354   BILITOT 0.8 08/22/2018 1430   BILITOT 0.5 04/25/2018 1354       RADIOGRAPHIC STUDIES: CT Chest W Contrast  Result Date: 04/20/2019 CLINICAL DATA:  Lung cancer. Restaging. EXAM: CT CHEST WITH CONTRAST TECHNIQUE: Multidetector CT imaging of the chest was performed during intravenous contrast administration. CONTRAST:  16mL OMNIPAQUE IOHEXOL 300 MG/ML  SOLN  COMPARISON:  03/28/2018 FINDINGS: Cardiovascular: Heart size upper normal. Trace pericardial effusion is new in the interval. Coronary artery calcification is evident. Atherosclerotic calcification is noted in the wall of the thoracic aorta. Atherosclerotic disease again noted at the aortic arch branch vessel anatomy. Mediastinum/Nodes: 10 mm short axis right paratracheal node on 55/3 is similar to minimally increased from 9 mm previously. Small left hilar nodes measuring in the 8 mm short axis size range are mildly more conspicuous. The esophagus has normal imaging features. There is no axillary lymphadenopathy. Lungs/Pleura: Centrilobular emphsyema noted. Right upper lobe pulmonary nodule has decreased substantially in the interval measuring 9 x 7 mm today compared to 1.9 x 1.5 cm previously. Volume loss and bandlike opacity around this nodule today is compatible with radiation change There is bibasilar collapse/consolidative change on today's exam, left greater than right and new since prior study. Small left pleural effusion is progressive since previous exam and there is some posterior left upper lobe atelectasis. Upper Abdomen: Unremarkable. Musculoskeletal: No worrisome lytic or sclerotic osseous abnormality. IMPRESSION: 1. Interval decrease in size of the right upper lobe pulmonary nodule with surrounding post radiation change. 2. Interval progression of small left pleural effusion with bibasilar collapse/consolidative change, left greater than right. 3. Borderline mediastinal lymphadenopathy, stable to minimally increased in the interval. Continued attention on follow-up recommended. 4. Aortic Atherosclerosis (ICD10-I70.0) and Emphysema (ICD10-J43.9). Electronically Signed   By: Misty Stanley M.D.   On: 04/20/2019 12:28    ASSESSMENT AND PLAN: This is a very pleasant 69 years old white male with stage Ia (T1c, N0, M0) non-small cell lung cancer, adenocarcinoma presented with right upper lobe lung nodule  status post SBRT under the care of Dr. Dorathy Daft in September 2020. The patient is here today for evaluation with repeat CT scan of the chest. I personally and independently reviewed the scan images and discussed the results with the patient today. Has a scan showed decrease  in the size of the right upper lobe pulmonary nodule but there is increased and small left pleural effusion as well as borderline lymphadenopathy that need close monitoring on upcoming imaging studies. I recommended for the patient to continue on observation with repeat CT scan of the chest in 3 months for restaging of his disease. The patient was advised to call immediately if he has any other concerning symptoms in the interval. The patient voices understanding of current disease status and treatment options and is in agreement with the current care plan.  All questions were answered. The patient knows to call the clinic with any problems, questions or concerns. We can certainly see the patient much sooner if necessary.  Disclaimer: This note was dictated with voice recognition software. Similar sounding words can inadvertently be transcribed and may not be corrected upon review.

## 2019-04-27 ENCOUNTER — Telehealth: Payer: Self-pay | Admitting: Internal Medicine

## 2019-04-27 NOTE — Telephone Encounter (Signed)
Scheduled per los. Called and left msg. Mailed printout  °

## 2019-05-05 DIAGNOSIS — G4701 Insomnia due to medical condition: Secondary | ICD-10-CM | POA: Diagnosis not present

## 2019-05-18 DIAGNOSIS — E1140 Type 2 diabetes mellitus with diabetic neuropathy, unspecified: Secondary | ICD-10-CM | POA: Diagnosis not present

## 2019-05-18 DIAGNOSIS — M79674 Pain in right toe(s): Secondary | ICD-10-CM | POA: Diagnosis not present

## 2019-05-19 DIAGNOSIS — C3490 Malignant neoplasm of unspecified part of unspecified bronchus or lung: Secondary | ICD-10-CM | POA: Diagnosis not present

## 2019-05-19 DIAGNOSIS — R05 Cough: Secondary | ICD-10-CM | POA: Diagnosis not present

## 2019-05-19 DIAGNOSIS — J441 Chronic obstructive pulmonary disease with (acute) exacerbation: Secondary | ICD-10-CM | POA: Diagnosis not present

## 2019-05-19 DIAGNOSIS — R0989 Other specified symptoms and signs involving the circulatory and respiratory systems: Secondary | ICD-10-CM | POA: Diagnosis not present

## 2019-05-19 DIAGNOSIS — E1140 Type 2 diabetes mellitus with diabetic neuropathy, unspecified: Secondary | ICD-10-CM | POA: Diagnosis not present

## 2019-05-20 DIAGNOSIS — R05 Cough: Secondary | ICD-10-CM | POA: Diagnosis not present

## 2019-05-20 DIAGNOSIS — R0989 Other specified symptoms and signs involving the circulatory and respiratory systems: Secondary | ICD-10-CM | POA: Diagnosis not present

## 2019-05-22 DIAGNOSIS — R0989 Other specified symptoms and signs involving the circulatory and respiratory systems: Secondary | ICD-10-CM | POA: Diagnosis not present

## 2019-05-22 DIAGNOSIS — C3490 Malignant neoplasm of unspecified part of unspecified bronchus or lung: Secondary | ICD-10-CM | POA: Diagnosis not present

## 2019-05-22 DIAGNOSIS — R05 Cough: Secondary | ICD-10-CM | POA: Diagnosis not present

## 2019-05-22 DIAGNOSIS — J441 Chronic obstructive pulmonary disease with (acute) exacerbation: Secondary | ICD-10-CM | POA: Diagnosis not present

## 2019-06-09 DIAGNOSIS — G4701 Insomnia due to medical condition: Secondary | ICD-10-CM | POA: Diagnosis not present

## 2019-06-20 DIAGNOSIS — J449 Chronic obstructive pulmonary disease, unspecified: Secondary | ICD-10-CM | POA: Diagnosis not present

## 2019-06-20 DIAGNOSIS — R05 Cough: Secondary | ICD-10-CM | POA: Diagnosis not present

## 2019-06-20 DIAGNOSIS — C3490 Malignant neoplasm of unspecified part of unspecified bronchus or lung: Secondary | ICD-10-CM | POA: Diagnosis not present

## 2019-06-20 DIAGNOSIS — R0989 Other specified symptoms and signs involving the circulatory and respiratory systems: Secondary | ICD-10-CM | POA: Diagnosis not present

## 2019-06-22 DIAGNOSIS — C3490 Malignant neoplasm of unspecified part of unspecified bronchus or lung: Secondary | ICD-10-CM | POA: Diagnosis not present

## 2019-06-22 DIAGNOSIS — R0989 Other specified symptoms and signs involving the circulatory and respiratory systems: Secondary | ICD-10-CM | POA: Diagnosis not present

## 2019-06-22 DIAGNOSIS — J449 Chronic obstructive pulmonary disease, unspecified: Secondary | ICD-10-CM | POA: Diagnosis not present

## 2019-06-22 DIAGNOSIS — R05 Cough: Secondary | ICD-10-CM | POA: Diagnosis not present

## 2019-06-23 IMAGING — DX DG HAND COMPLETE 3+V*L*
4 series · 4 of 4 positions shown · non-contrast
Comparison: None.

CLINICAL DATA: Status post fall with left hand pain.

EXAM:
LEFT HAND - COMPLETE 3+ VIEW

[hand pa]
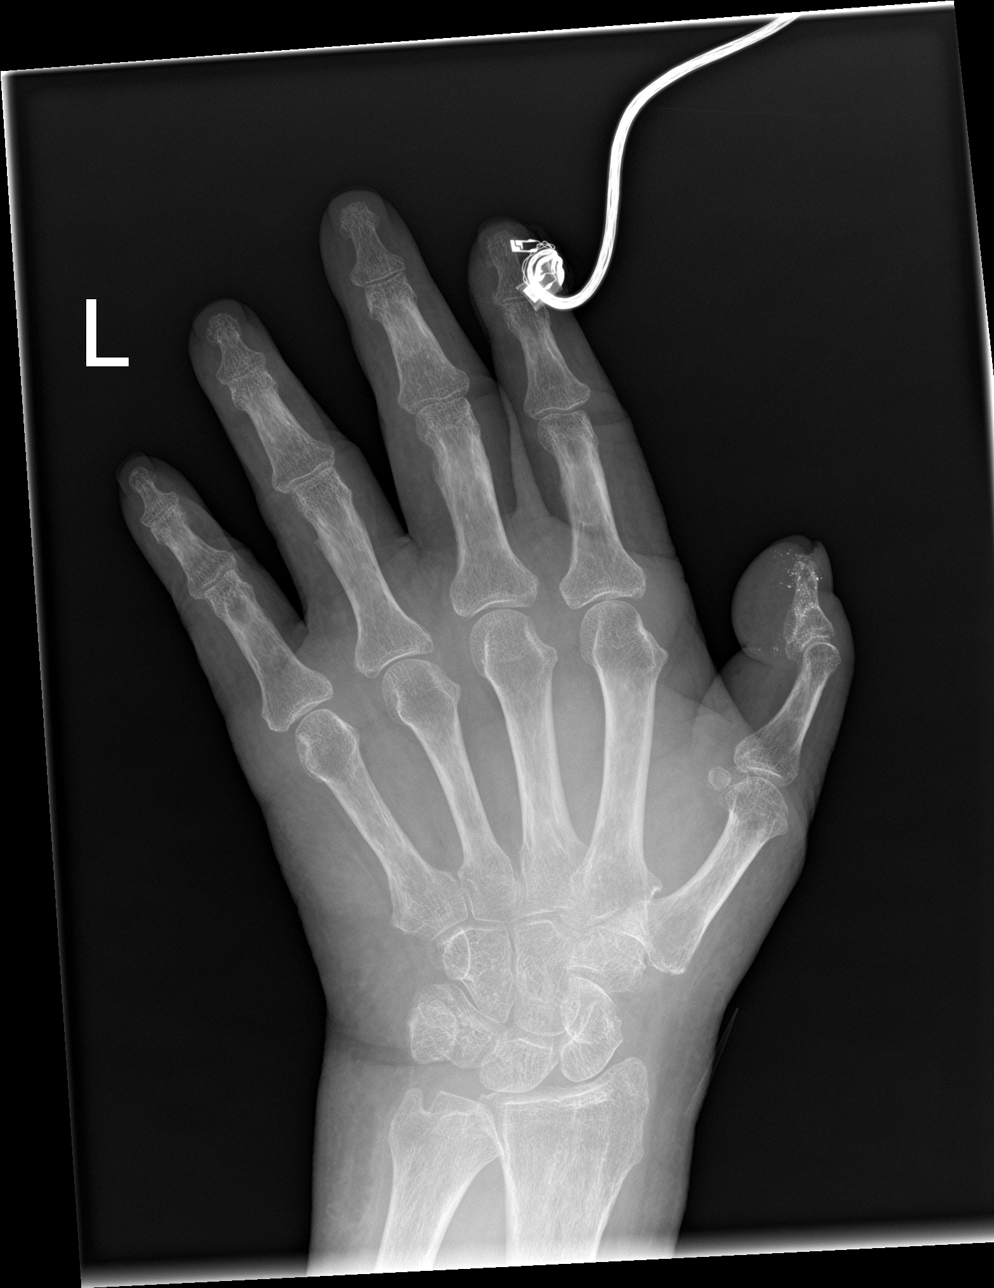

[hand obl (1 of 2)]
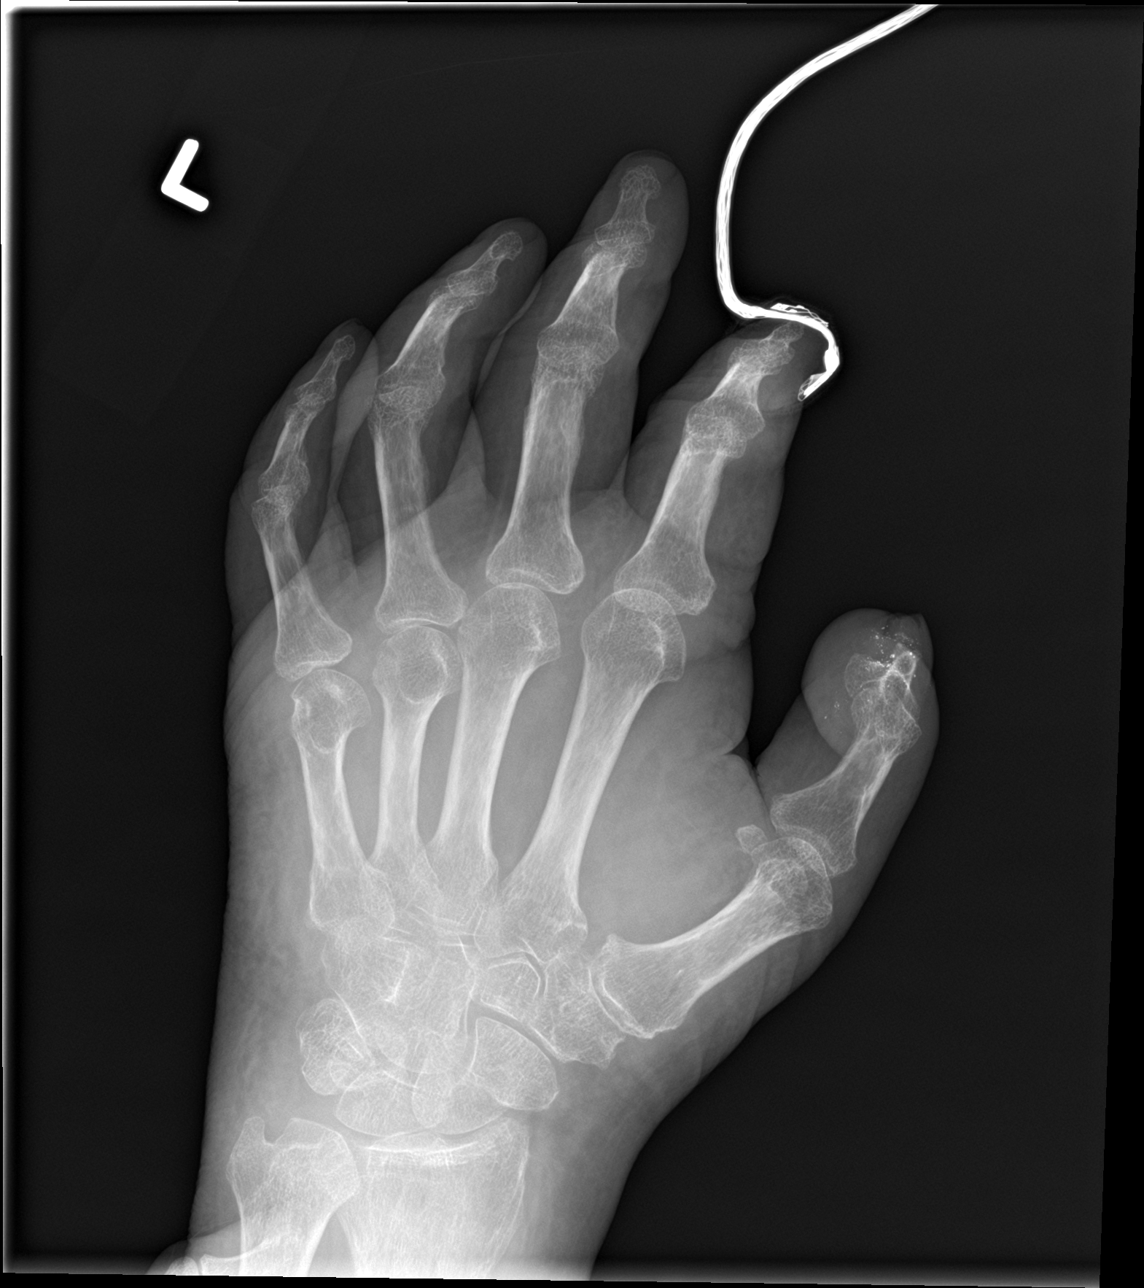

[hand lat]
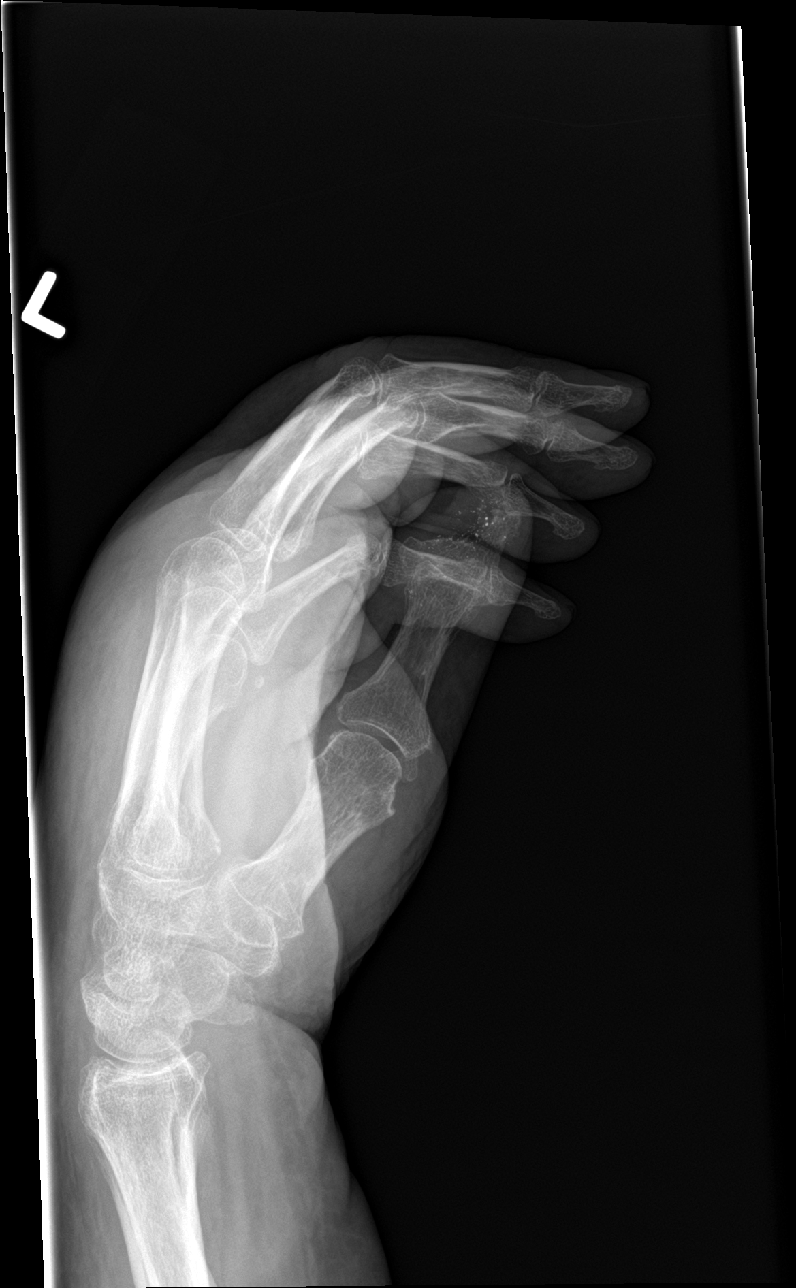

[hand obl (2 of 2)]
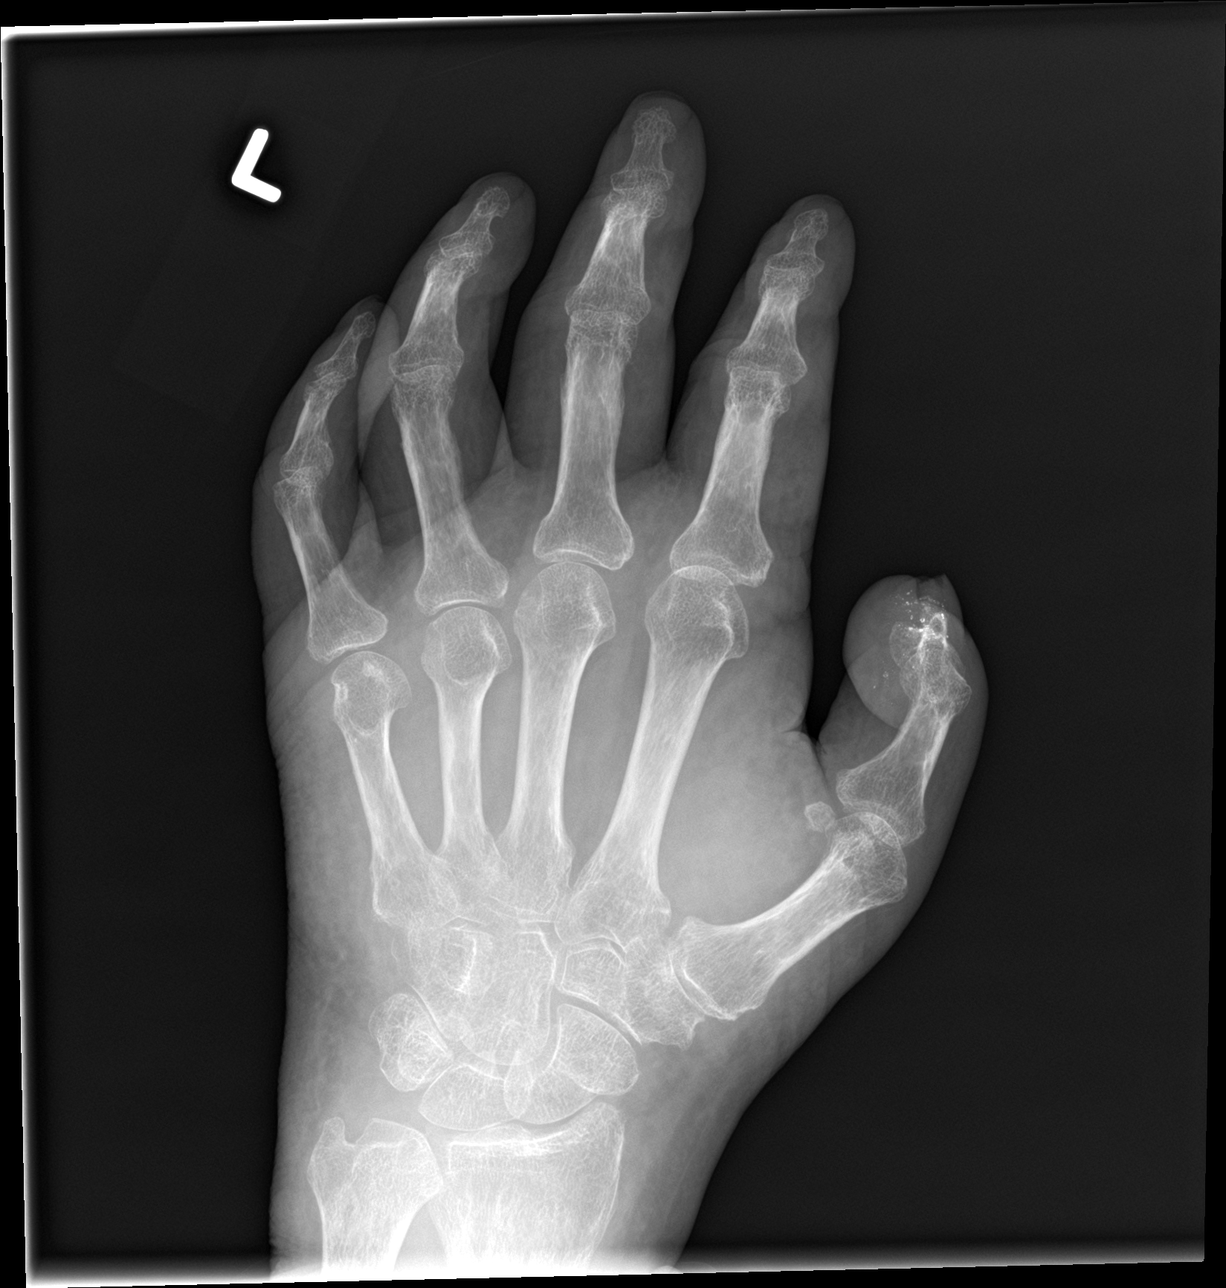

[4 of 4 positions shown; findings below may reference images not displayed]

FINDINGS: There is no evidence of fracture or dislocation. There is chronic
deformity of the left first distal phalanx with associated multiple
small soft tissue radiopaque foreign body. Degenerative joint
changes of the first metacarpal-carpal joint is noted.
IMPRESSION: No acute fracture or dislocation.

Chronic deformity of the left first distal phalanx with associated
multiple small soft tissue radiopaque foreign body.

## 2019-06-23 IMAGING — CT CT CERVICAL SPINE W/O CM
5 of 8 series · 11 of 33 positions shown, 12 images · non-contrast
Comparison: 04/13/2017

CLINICAL DATA: Head injury. Fell in bathroom.

EXAM:
CT HEAD WITHOUT CONTRAST
CT CERVICAL SPINE WITHOUT CONTRAST
TECHNIQUE: Multidetector CT imaging of the head and cervical spine was
performed following the standard protocol without intravenous
contrast. Multiplanar CT image reconstructions of the cervical spine
were also generated.

[Series 5: head bone · axial · 0.44mm/px · z∈[-58,-0]mm · 2 of 87 slices shown]
[im 29/87  bone]
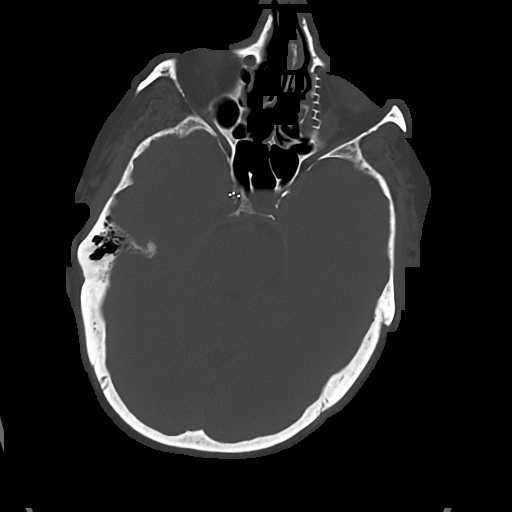
[im 58/87  bone]
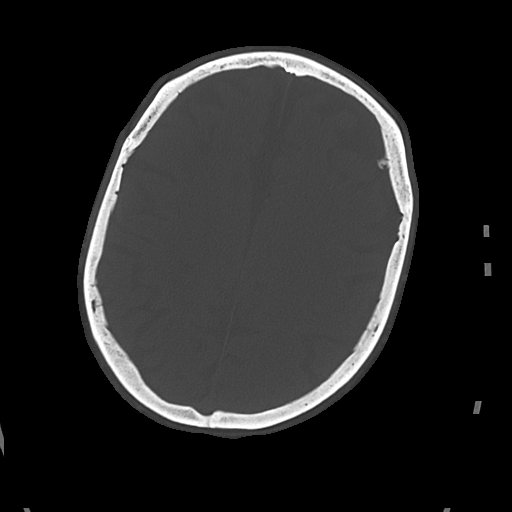

[Series 9: c spine soft · axial · 0.27mm/px · z∈[-198,-136]mm · 2 of 94 slices shown]
[im 32/94  soft-tissue]
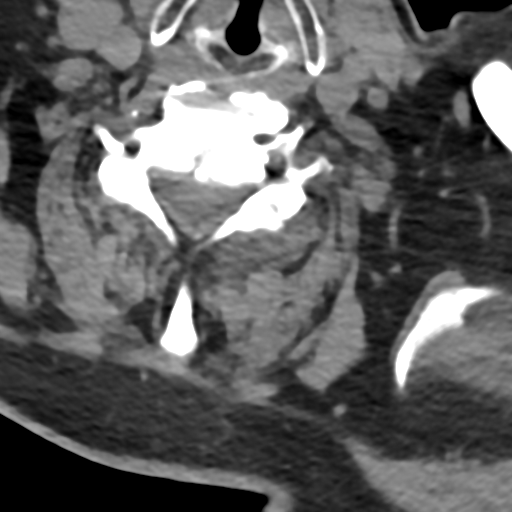
[im 63/94  soft-tissue]
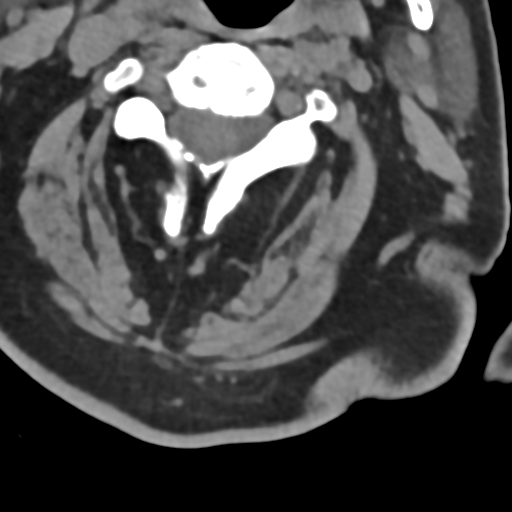

[Series 10: sag bone · sagittal · 0.28mm/px · 4 of 56 slices shown]
[im 12/56  bone]
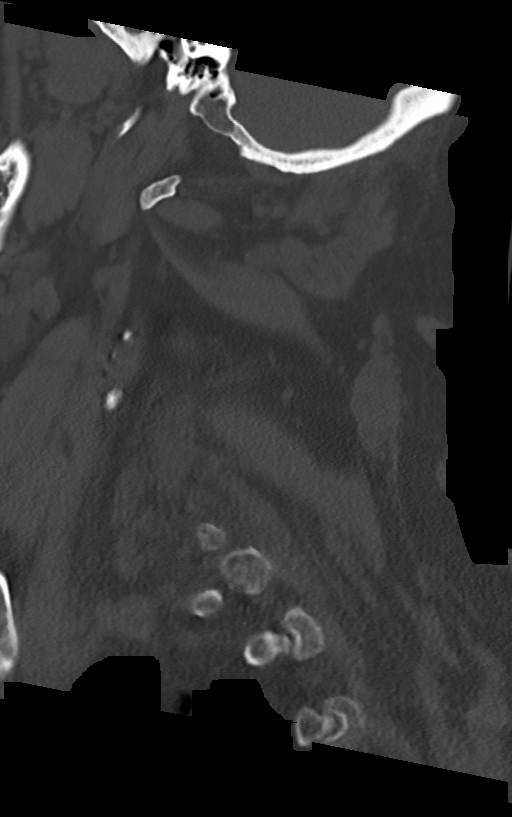
[im 23/56  bone]
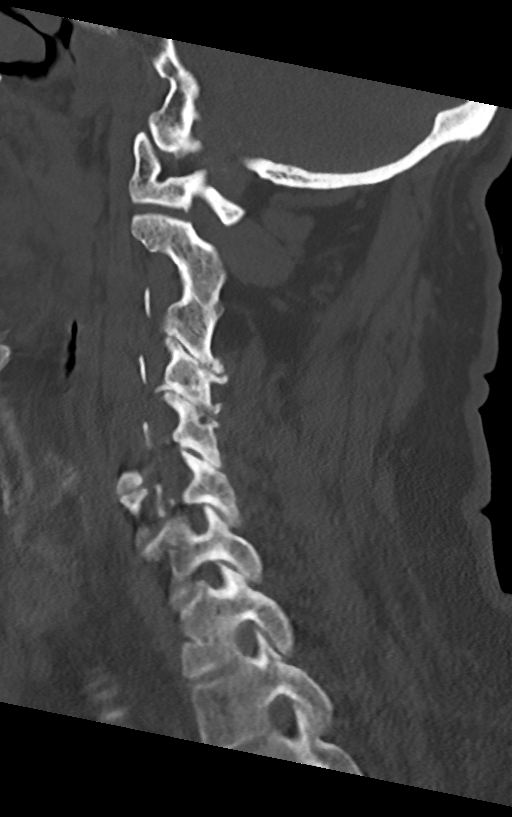
[im 34/56  bone]
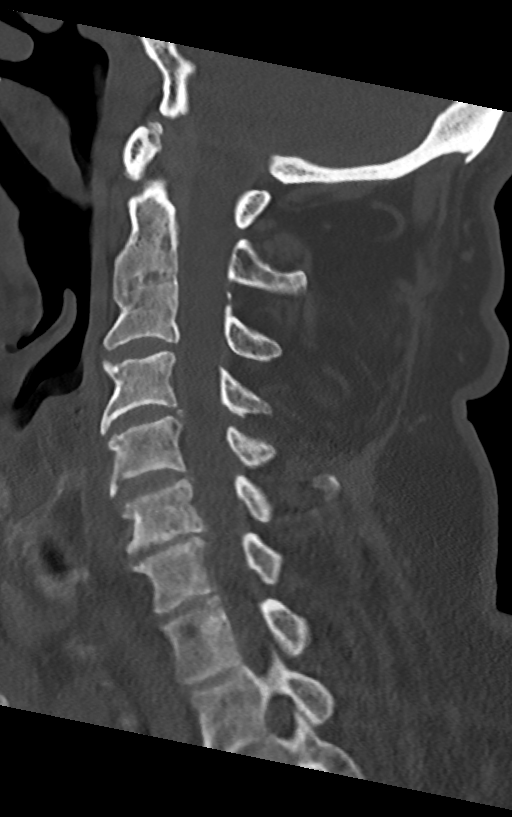
[im 45/56  bone]
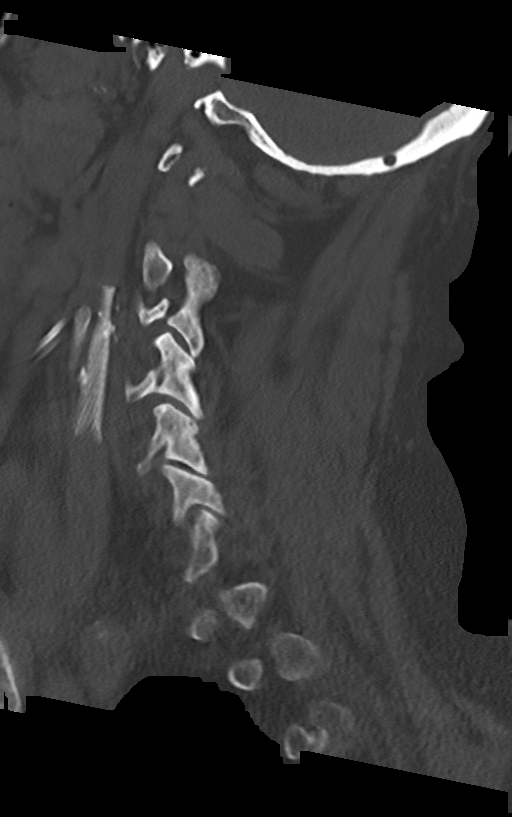

[Series 11: cor bone · coronal · 0.28mm/px · 1 of 61 slices shown]
[im 31/61  bone]
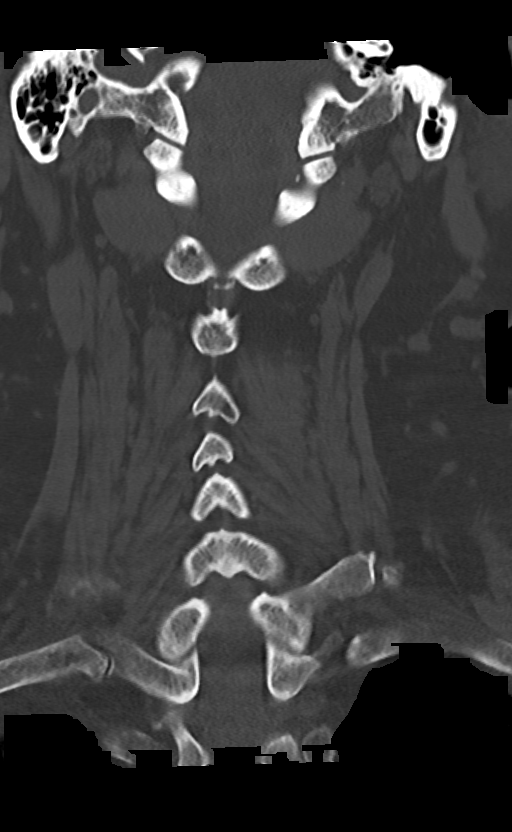

[Series 12: orthogonal axials · axial · 0.21mm/px · z∈[-221,-150]mm · 2 of 96 slices shown, 3 images]
[im 32/96  soft-tissue]
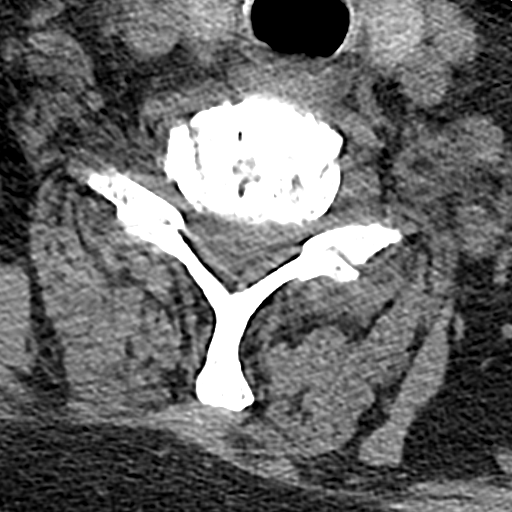
[im 32/96  bone]
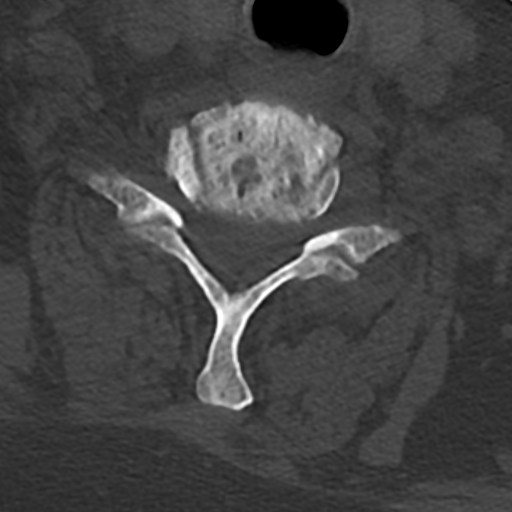
[im 64/96  bone]
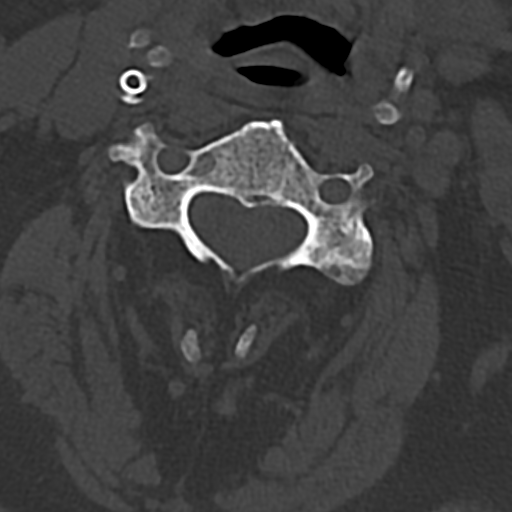

[11 of 33 positions shown; findings below may reference images not displayed]

FINDINGS: CT HEAD FINDINGS

Brain: No evidence of acute infarction, hemorrhage, hydrocephalus,
extra-axial collection or mass lesion/mass effect. Encephalomalacia
within the right parietal lobe compatible with old infarct. There is
mild diffuse low-attenuation within the subcortical and
periventricular white matter compatible with chronic microvascular
disease. There is prominence of the sulci and ventricles compatible
with brain atrophy.

Vascular: No hyperdense vessel or unexpected calcification.

Skull: Normal. Negative for fracture or focal lesion.

Sinuses/Orbits: Previous surgical repair of left orbital fracture.
Moderate mucosal thickening of the left maxillary sinus noted. No
acute findings identified.

Other: None

CT CERVICAL SPINE FINDINGS

Alignment: Normal.

Skull base and vertebrae: No acute fracture. No primary bone lesion
or focal pathologic process.

Soft tissues and spinal canal: No prevertebral fluid or swelling. No
visible canal hematoma.

Disc levels: Solid fusion of the C2 and C3 vertebra identified.
Multi level disc space narrowing and ventral endplate spurring
noted. Most advanced at C6-7.

Upper chest: Negative.

Other: A expandable stent is identified within the right internal
carotid artery.
IMPRESSION: 1. No acute intracranial abnormalities.
2. Chronic small vessel ischemic disease and brain atrophy.
3. No evidence for cervical spine fracture.
4. Cervical degenerative disc disease.

## 2019-06-23 IMAGING — DX DG FOREARM 2V*L*
3 series · 3 of 3 positions shown · non-contrast
Comparison: None.

CLINICAL DATA: Status post fall with left forearm pain.

EXAM:
LEFT FOREARM - 2 VIEW

[forearm ap]
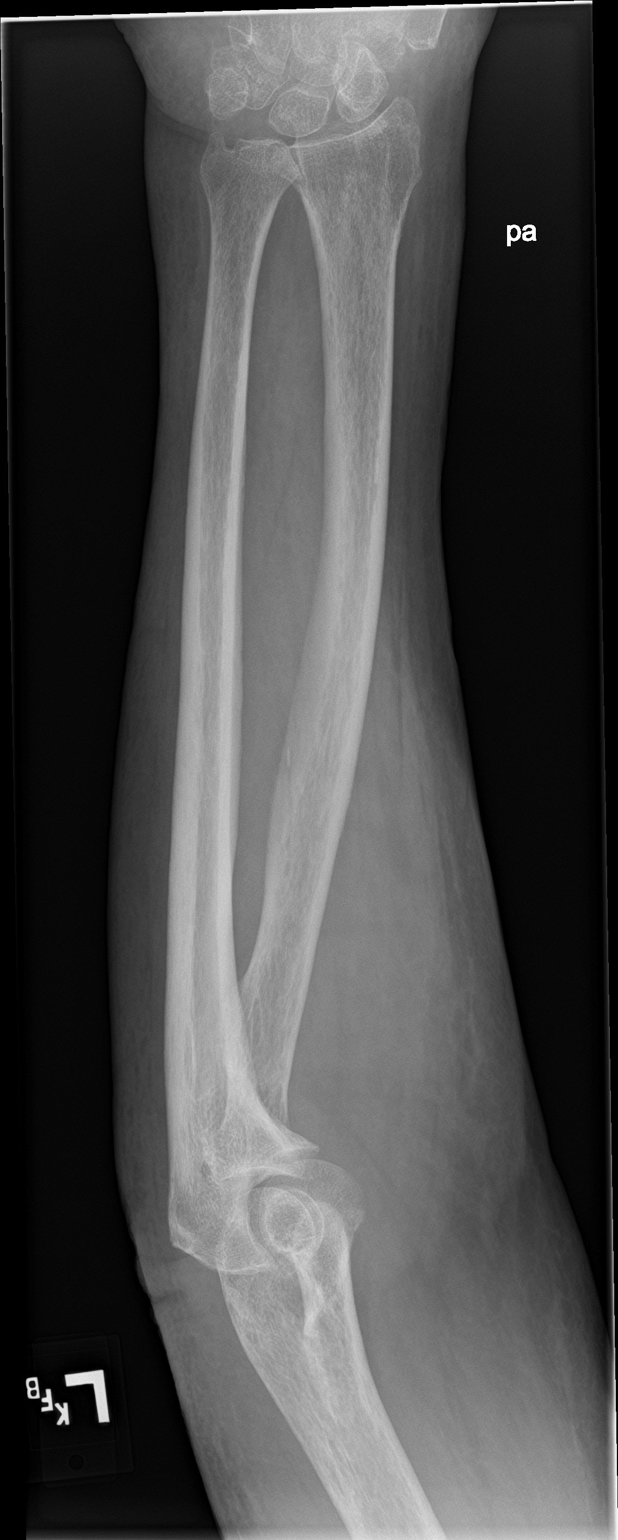

[forearm lat (1 of 2)]
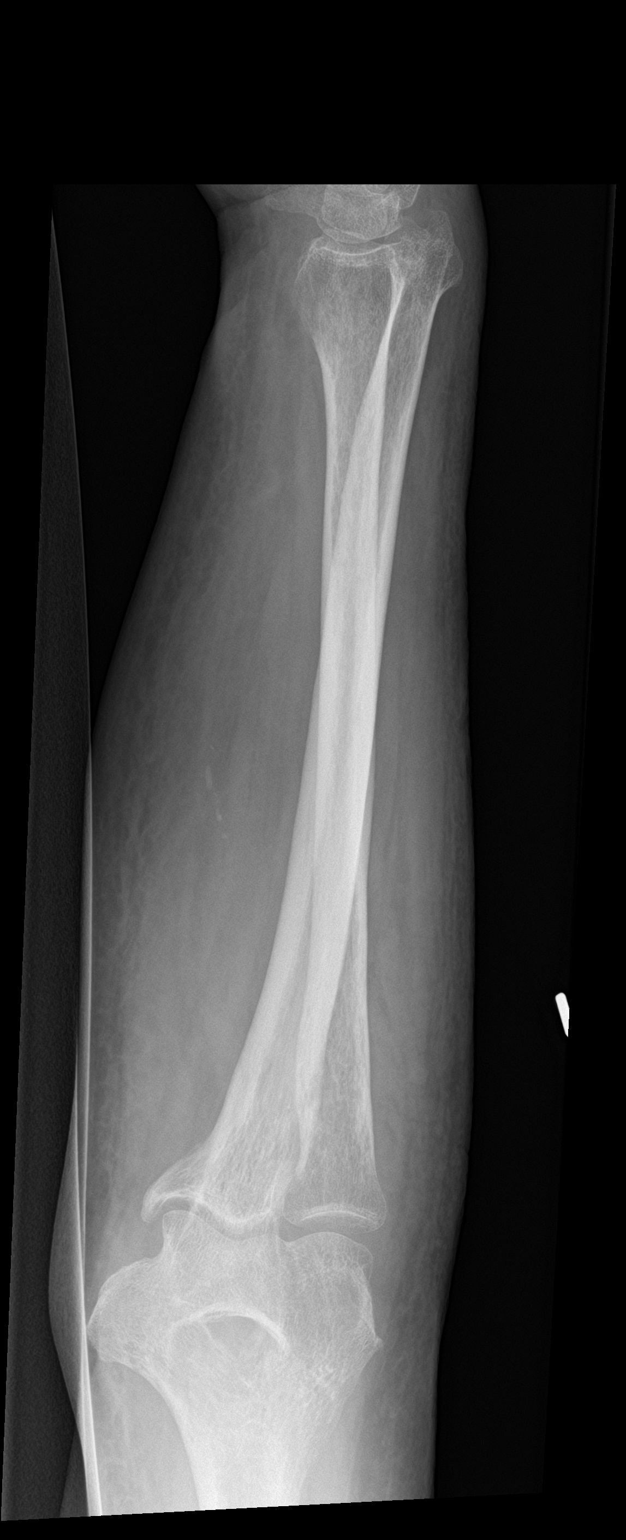

[forearm lat (2 of 2)]
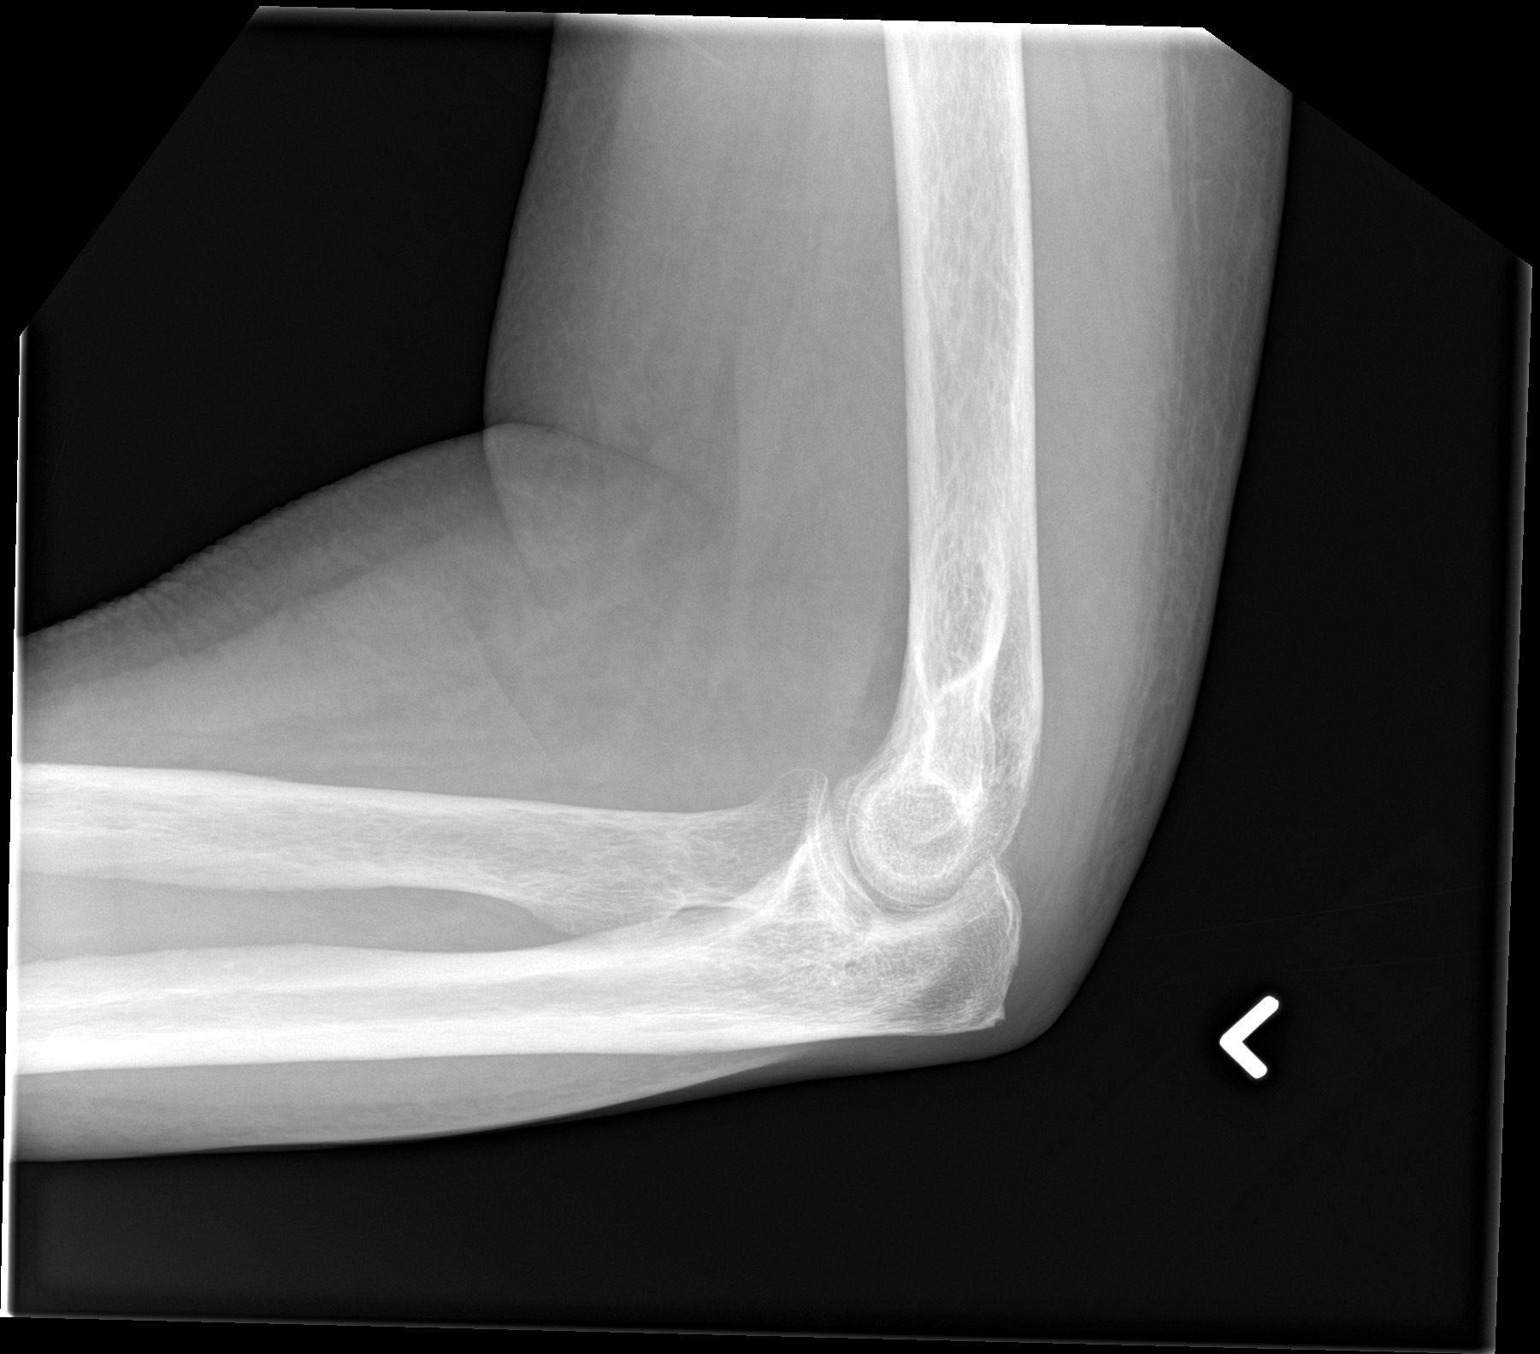

[3 of 3 positions shown; findings below may reference images not displayed]

FINDINGS: There is no evidence of fracture or dislocation. Soft tissues are
unremarkable.
IMPRESSION: No acute fracture or dislocation.

## 2019-06-23 IMAGING — DX DG PELVIS 1-2V
1 series · 1 of 1 positions shown · non-contrast
Comparison: None.

CLINICAL DATA: Status post fall with pelvic pain.

EXAM:
PELVIS - 1-2 VIEW

[pelvis ap]
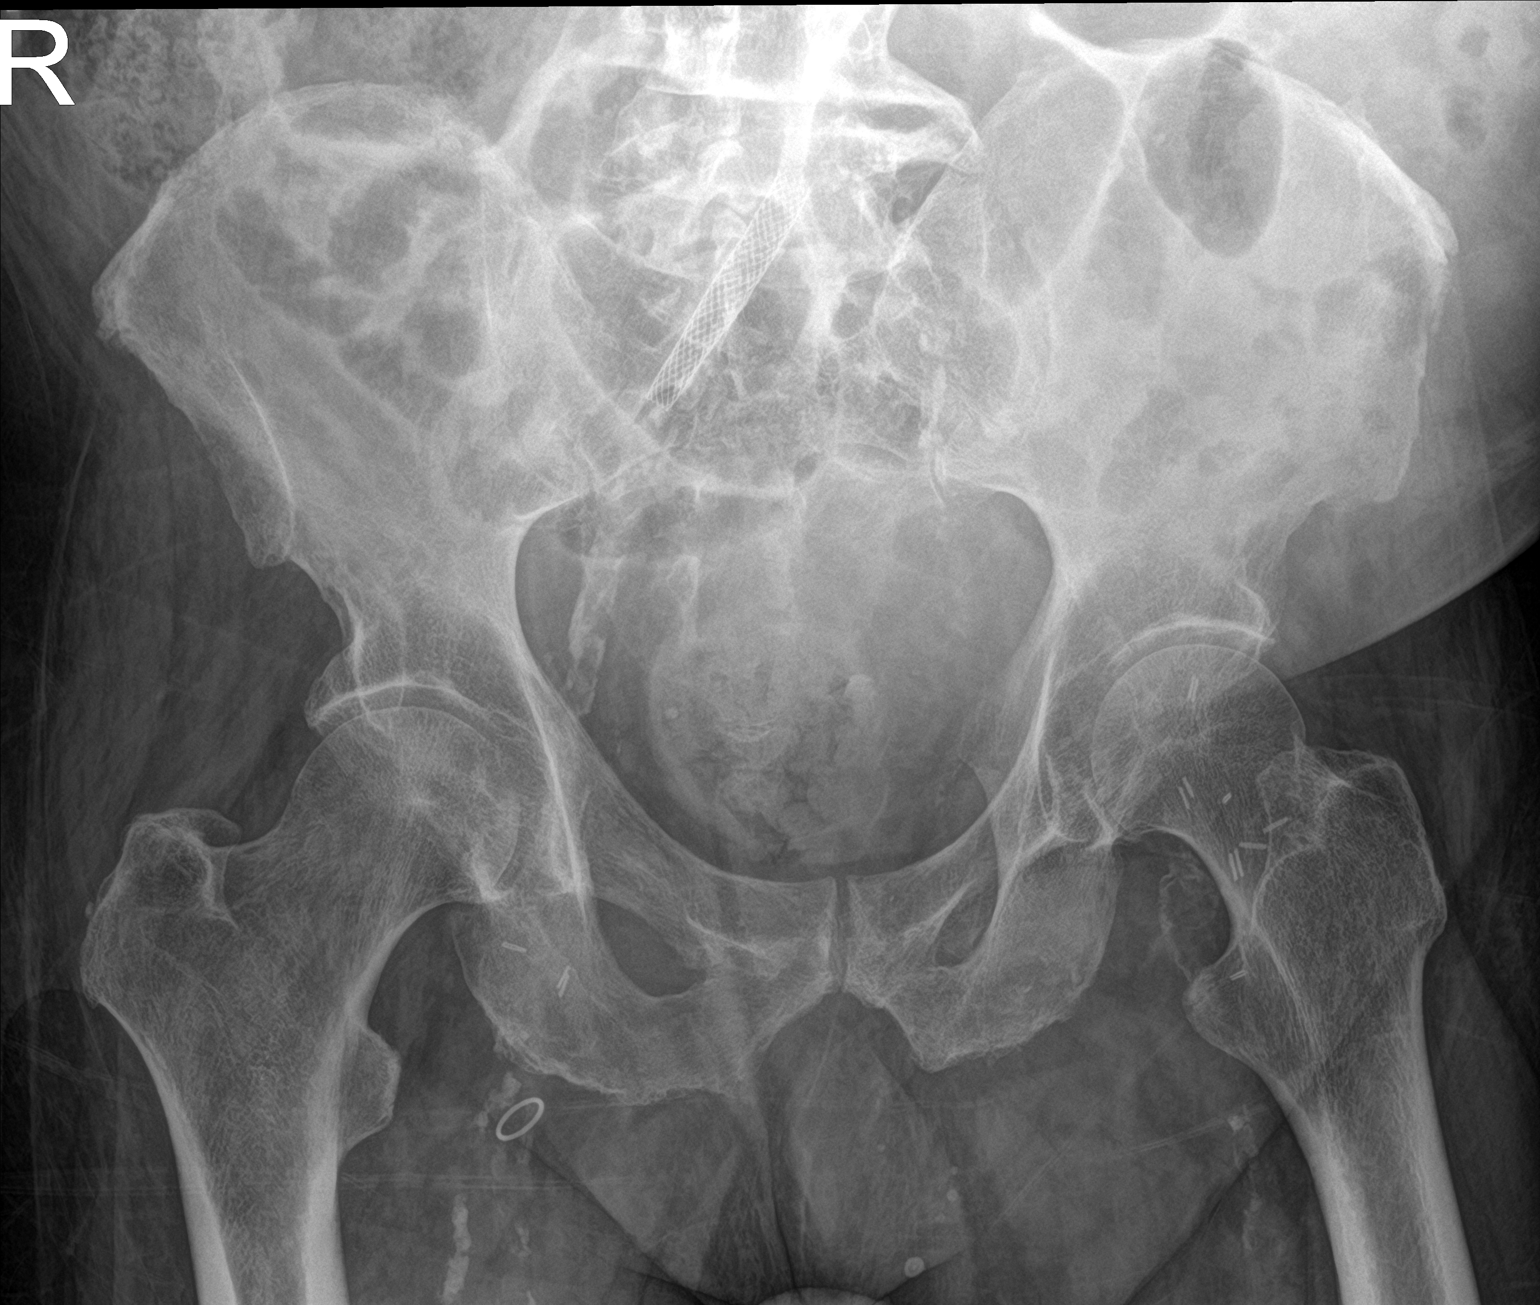

[1 of 1 positions shown; findings below may reference images not displayed]

FINDINGS: There is no evidence of pelvic fracture or dislocation. Vascular
stent is identified. Surgical clips are noted over bilateral
inguinal region.
IMPRESSION: No acute fracture or dislocation.

## 2019-06-29 DIAGNOSIS — C3490 Malignant neoplasm of unspecified part of unspecified bronchus or lung: Secondary | ICD-10-CM | POA: Diagnosis not present

## 2019-06-29 DIAGNOSIS — J449 Chronic obstructive pulmonary disease, unspecified: Secondary | ICD-10-CM | POA: Diagnosis not present

## 2019-06-29 DIAGNOSIS — R05 Cough: Secondary | ICD-10-CM | POA: Diagnosis not present

## 2019-06-29 DIAGNOSIS — R0989 Other specified symptoms and signs involving the circulatory and respiratory systems: Secondary | ICD-10-CM | POA: Diagnosis not present

## 2019-07-07 DIAGNOSIS — G4701 Insomnia due to medical condition: Secondary | ICD-10-CM | POA: Diagnosis not present

## 2019-07-25 ENCOUNTER — Inpatient Hospital Stay: Payer: Medicare Other | Attending: Internal Medicine

## 2019-07-27 ENCOUNTER — Inpatient Hospital Stay: Payer: Medicare Other | Admitting: Internal Medicine

## 2019-07-29 IMAGING — CT CT HEAD CODE STROKE
3 series · 15 of 47 positions shown, 18 images · non-contrast
Comparison: Head CT 09/19/2017

CLINICAL DATA: Code stroke. Right-sided gaze, left-sided neurologic
deficits.

EXAM:
CT HEAD WITHOUT CONTRAST
TECHNIQUE: Contiguous axial images were obtained from the base of the skull
through the vertex without intravenous contrast.

[Series 3: head 5.0 h30s · axial · 0.41mm/px · z∈[-50,+85]mm · 9 of 33 slices shown, 12 images]
[im 3/33  brain]
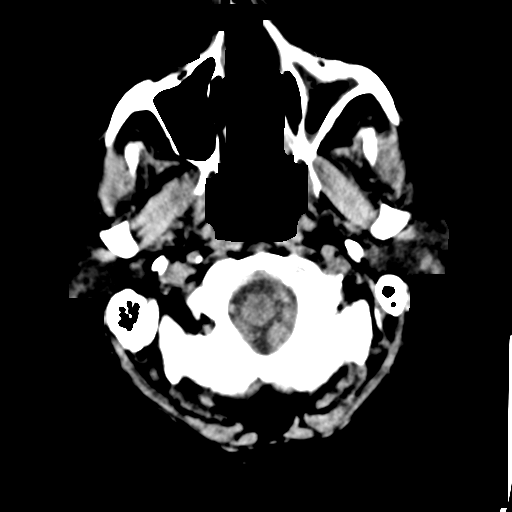
[im 3/33  bone]
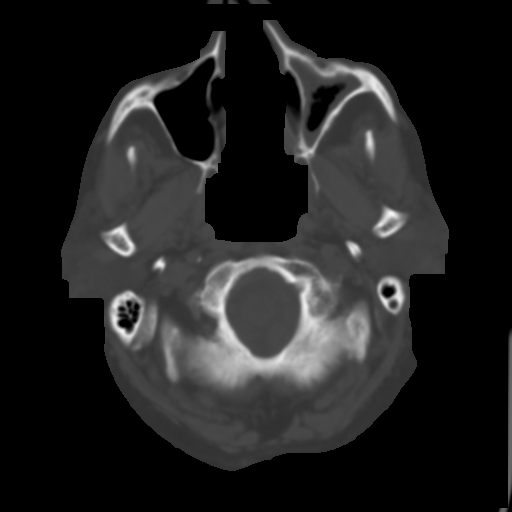
[im 6/33  brain]
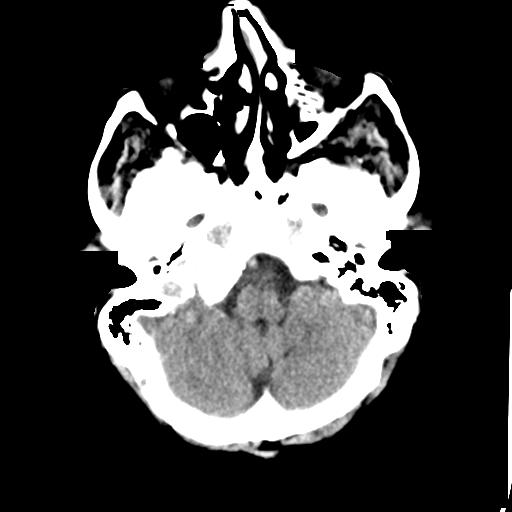
[im 9/33  brain]
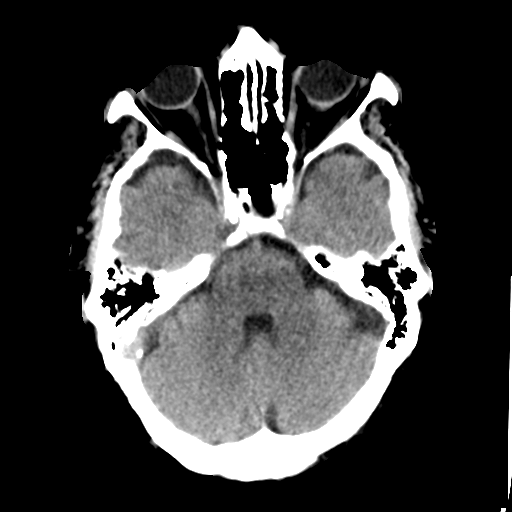
[im 13/33  brain]
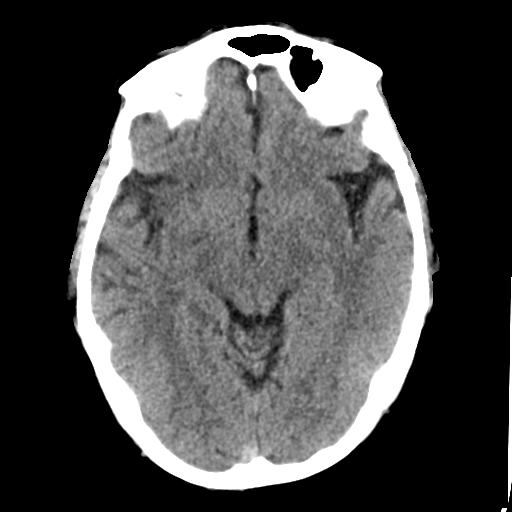
[im 17/33  brain]
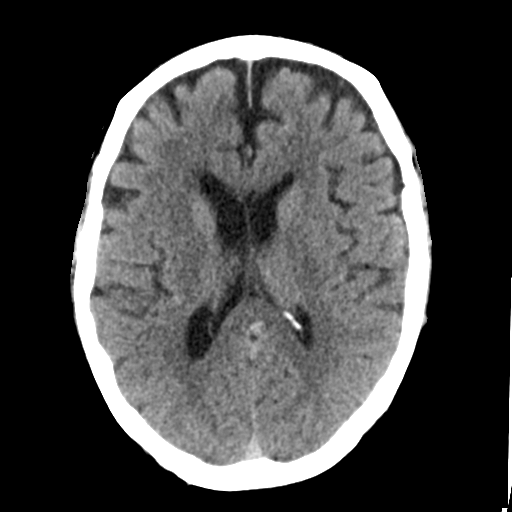
[im 17/33  bone]
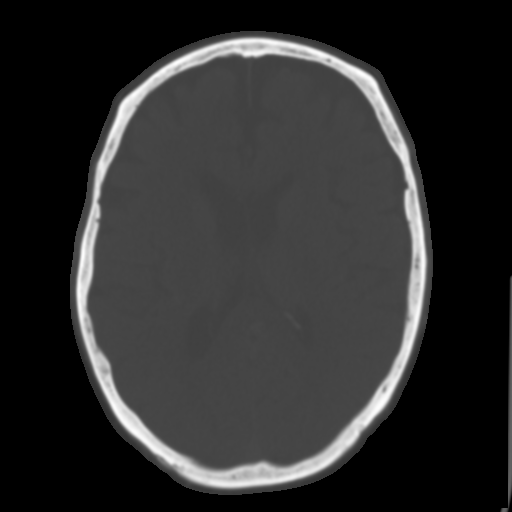
[im 20/33  brain]
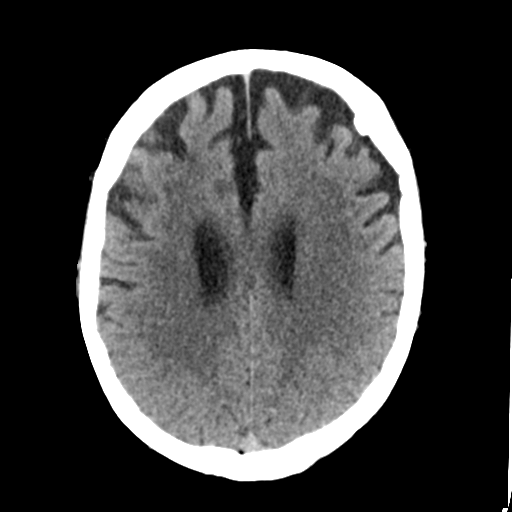
[im 24/33  brain]
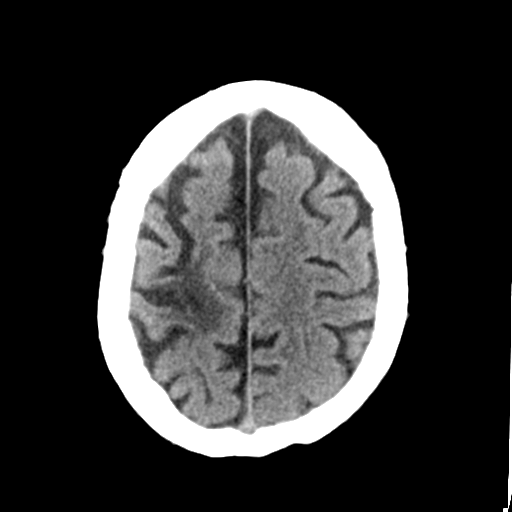
[im 27/33  brain]
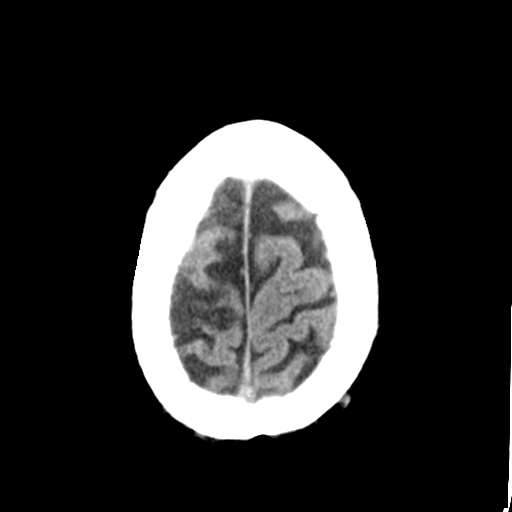
[im 30/33  brain]
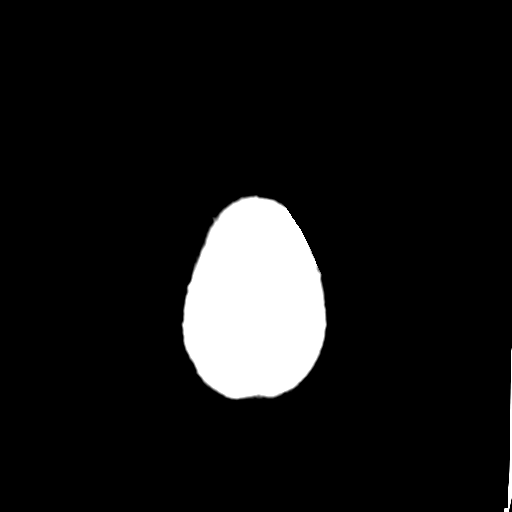
[im 30/33  bone]
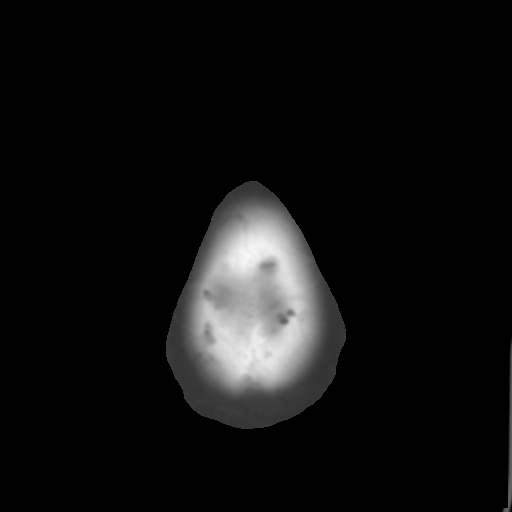

[Series 5: head 3.0 mpr cor · coronal · 0.32mm/px · 3 of 69 slices shown]
[im 23/69  brain]
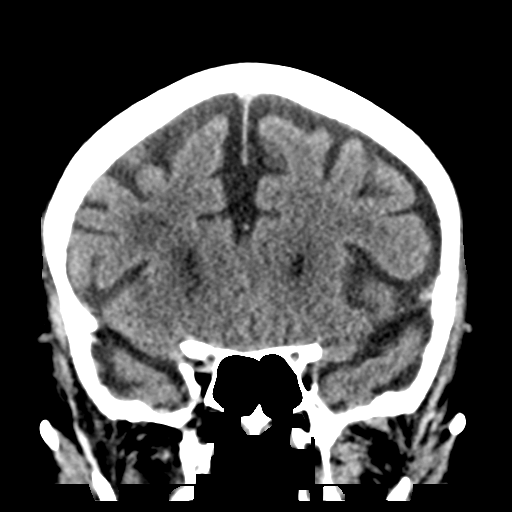
[im 31/69  brain]
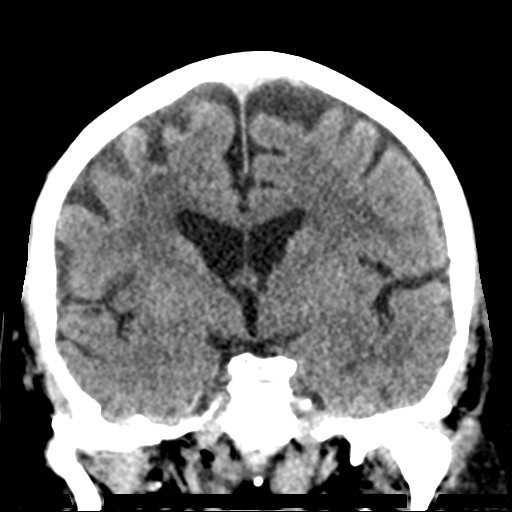
[im 38/69  brain]
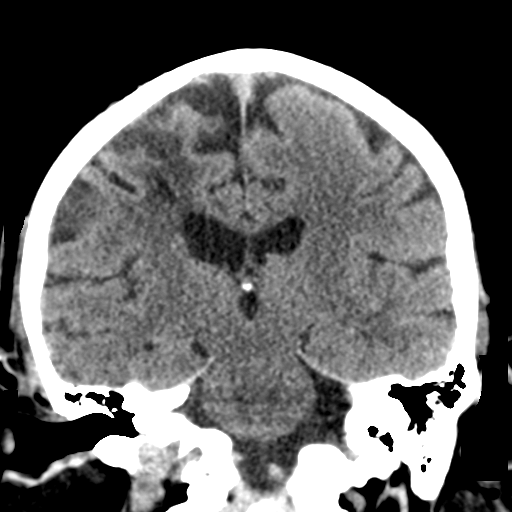

[Series 6: head 3.0 mpr sag · sagittal · 0.32mm/px · 3 of 56 slices shown]
[im 19/56  brain]
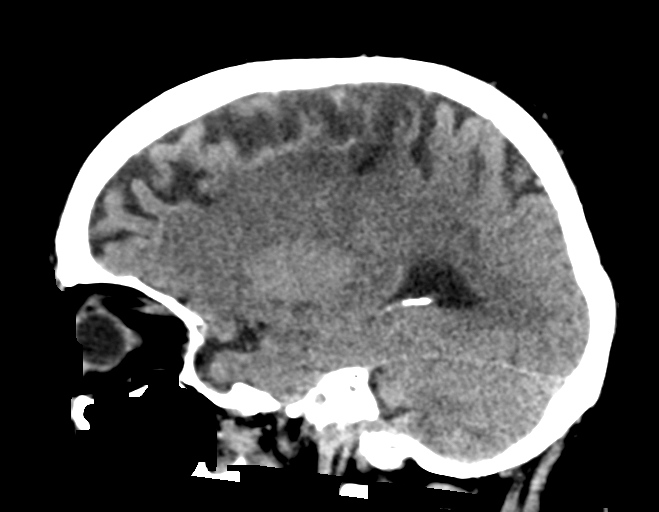
[im 28/56  brain]
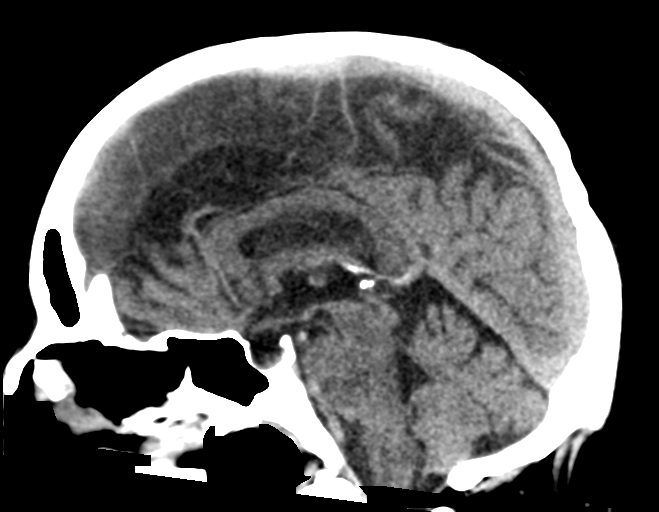
[im 37/56  brain]
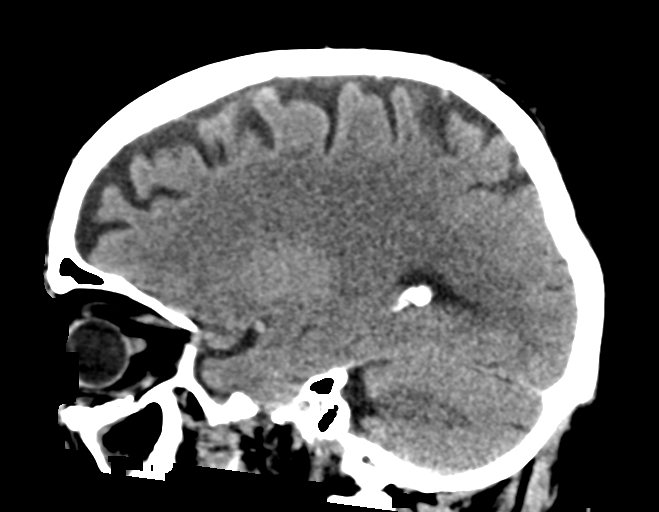

[15 of 47 positions shown; findings below may reference images not displayed]

FINDINGS: Brain: There is no mass, hemorrhage or extra-axial collection. The
size and configuration of the ventricles and extra-axial CSF spaces
are normal. There are 2 subcortical old right frontal lobe infarcts,
unchanged. There is hypoattenuation of the periventricular white
matter, most commonly indicating chronic ischemic microangiopathy.

Vascular: No abnormal hyperdensity of the major intracranial
arteries or dural venous sinuses. No intracranial atherosclerosis.

Skull: The visualized skull base, calvarium and extracranial soft
tissues are normal.

Sinuses/Orbits: No fluid levels or advanced mucosal thickening of
the visualized paranasal sinuses. No mastoid or middle ear effusion.
The orbits are normal.

ASPECTS (Alberta Stroke Program Early CT Score)

- Ganglionic level infarction (caudate, lentiform nuclei, internal
capsule, insula, M1-M3 cortex): 7

- Supraganglionic infarction (M4-M6 cortex): 3

Total score (0-10 with 10 being normal): 10
IMPRESSION: 1. No acute hemorrhage.
2. ASPECTS is 10.
3. Old subcortical infarcts within the right frontal lobe, unchanged
compared to 09/19/2017.

These results were communicated to Dr. Dhonthuththu Beevi at [DATE] on 10/25/2017 by text page via the [employer] messaging system.

## 2019-07-30 IMAGING — MR MR HEAD W/O CM
10 series · 48 of 48 positions shown · non-contrast
Comparison: Prior CTA from 10/25/2017.

CLINICAL DATA: Follow-up examination for acute stroke.

EXAM:
MRI HEAD WITHOUT CONTRAST
TECHNIQUE: Multiplanar, multiecho pulse sequences of the brain and surrounding
structures were obtained without intravenous contrast.

[Series 5: DWI · axial · 4.0mm · 0.88mm/px · z∈[-43,+99]mm · 10 of 74 slices shown (1 of 4)]
[im 1/74]
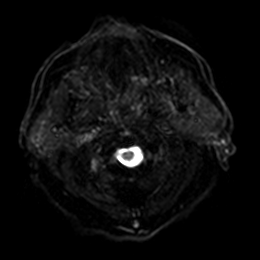
[im 9/74]
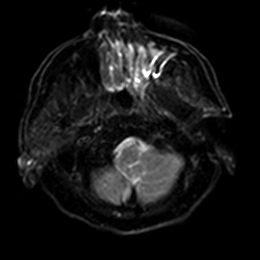
[im 17/74]
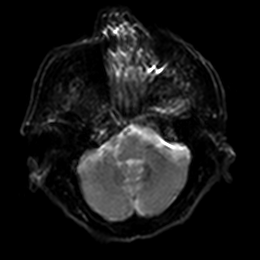
[im 25/74]
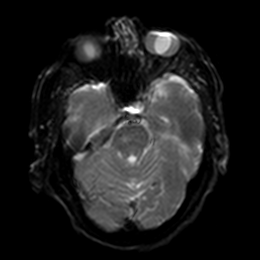
[im 33/74]
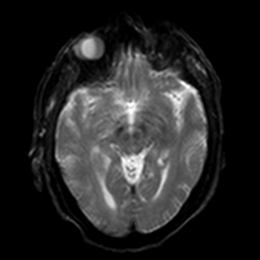
[im 41/74]
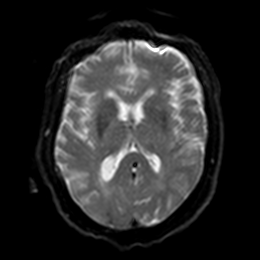
[im 49/74]
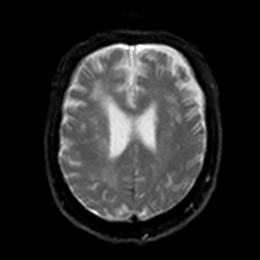
[im 57/74]
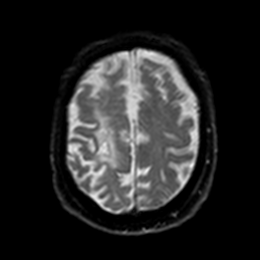
[im 65/74]
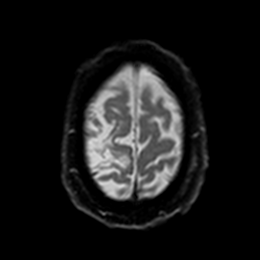
[im 74/74]
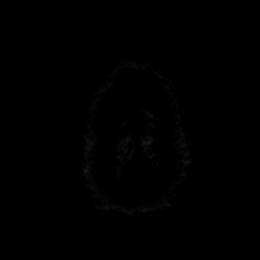

[Series 6: DWI · axial · 4.0mm · 0.88mm/px · z∈[-43,+99]mm · 6 of 37 slices shown (2 of 4)]
[im 1/37]
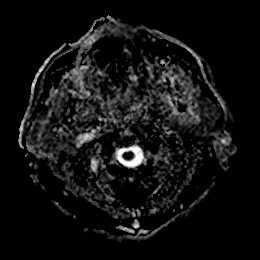
[im 8/37]
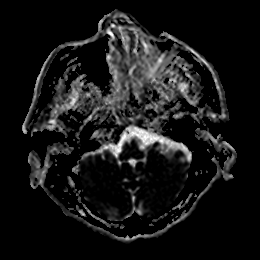
[im 15/37]
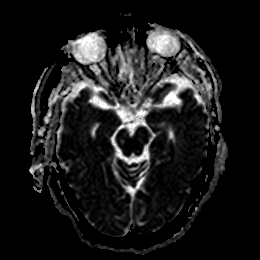
[im 22/37]
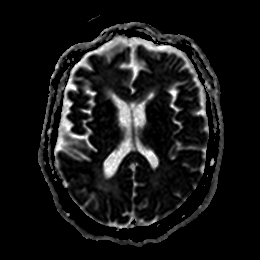
[im 29/37]
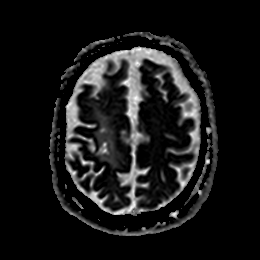
[im 37/37]
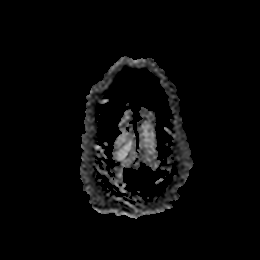

[Series 7: DWI · coronal · 4.0mm · 0.88mm/px · 9 of 70 slices shown (3 of 4)]
[im 1/70]
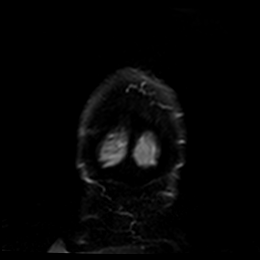
[im 9/70]
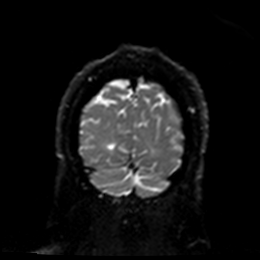
[im 18/70]
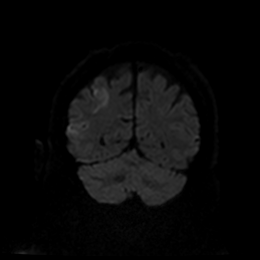
[im 26/70]
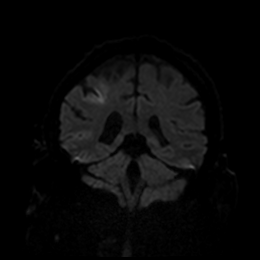
[im 35/70]
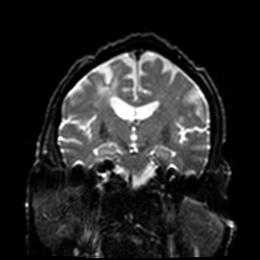
[im 44/70]
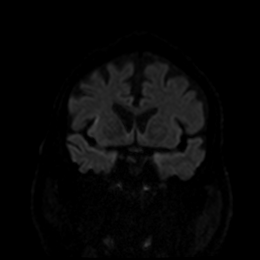
[im 52/70]
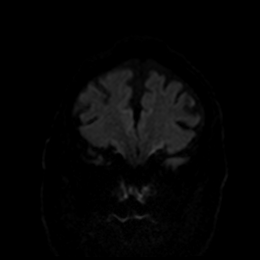
[im 61/70]
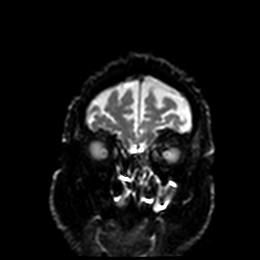
[im 70/70]
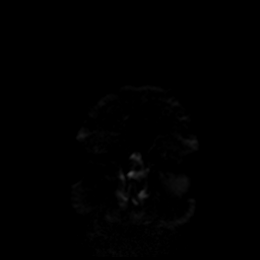

[Series 8: DWI · coronal · 4.0mm · 0.88mm/px · 4 of 35 slices shown (4 of 4)]
[im 1/35]
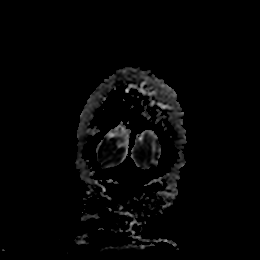
[im 12/35]
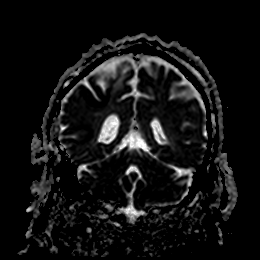
[im 23/35]
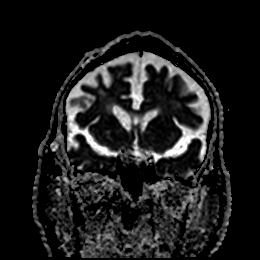
[im 35/35]
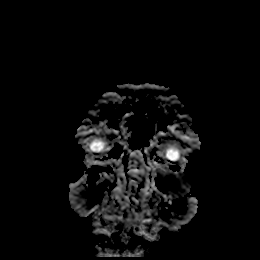

[Series 9: T1 · sagittal · 5.0mm · 0.75mm/px · 3 of 24 slices shown]
[im 1/24]
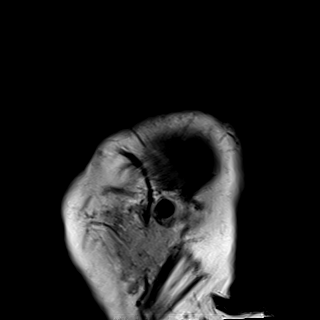
[im 12/24]
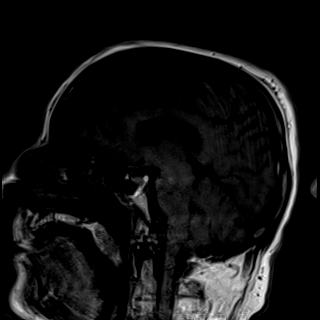
[im 24/24]
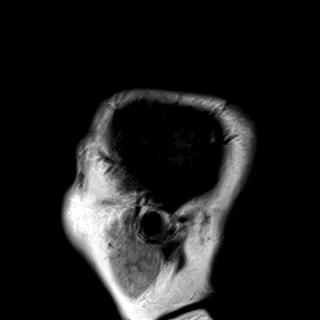

[Series 10: T2 · axial · 5.0mm · 0.72mm/px · z∈[-44,+97]mm · 3 of 25 slices shown (1 of 2)]
[im 1/25]
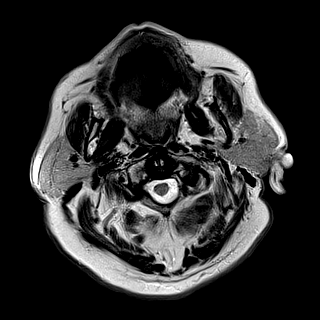
[im 13/25]
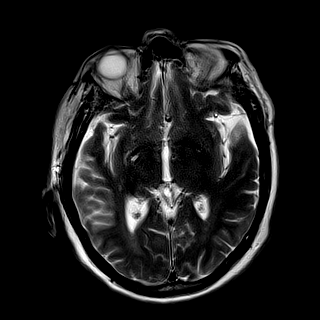
[im 25/25]
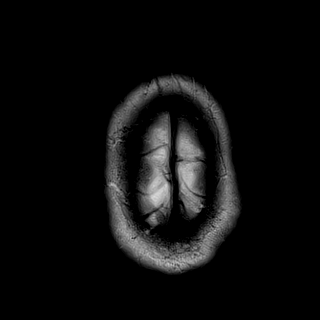

[Series 11: FLAIR · axial · 5.0mm · 0.90mm/px · z∈[-55,+99]mm · 3 of 27 slices shown (1 of 2)]
[im 1/27]
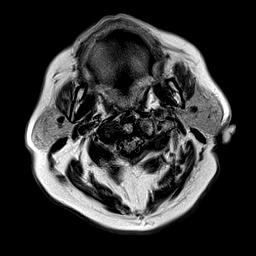
[im 14/27]
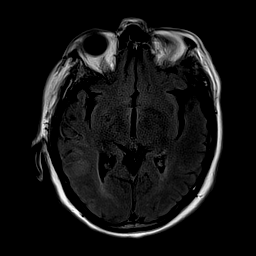
[im 27/27]
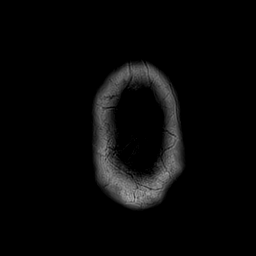

[Series 12: ax hemo · axial · 5.0mm · 0.86mm/px · z∈[-51,+103]mm · 3 of 27 slices shown]
[im 1/27]
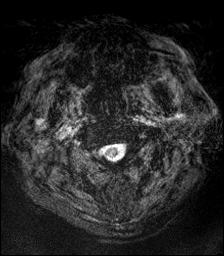
[im 14/27]
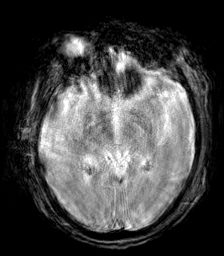
[im 27/27]
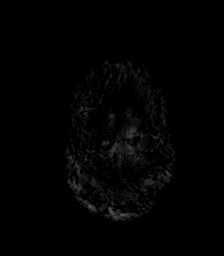

[Series 13: FLAIR · axial · 5.0mm · 0.90mm/px · z∈[-55,+99]mm · 3 of 27 slices shown (2 of 2)]
[im 1/27]
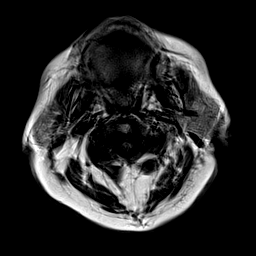
[im 14/27]
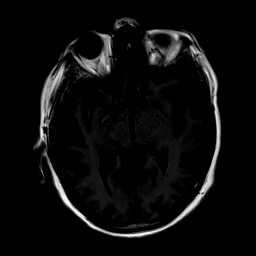
[im 27/27]
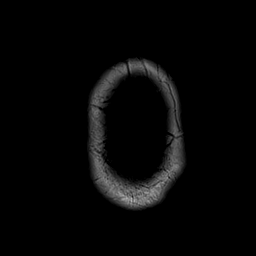

[Series 14: T2 · coronal · 5.0mm · 0.72mm/px · 4 of 28 slices shown (2 of 2)]
[im 1/28]
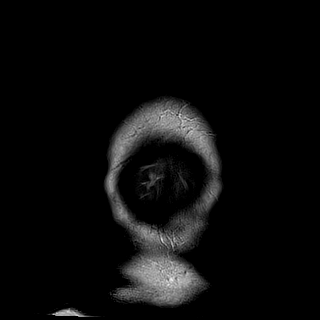
[im 10/28]
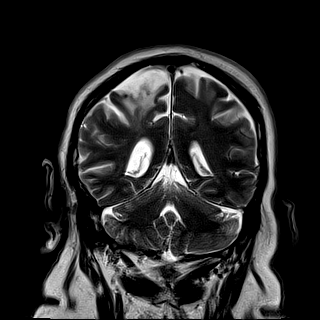
[im 19/28]
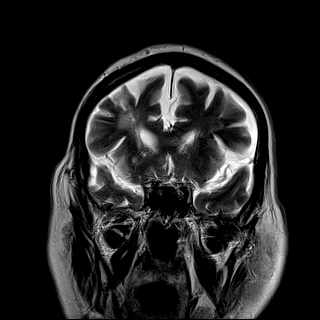
[im 28/28]
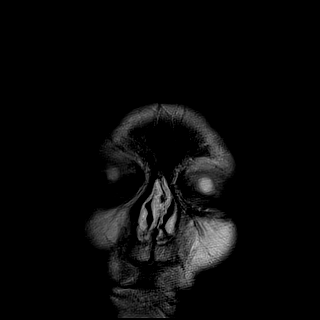

[48 of 48 positions shown; findings below may reference images not displayed]

FINDINGS: Brain: Examination moderately degraded by motion artifact.

Generalized age-related cerebral atrophy. Patchy T2/FLAIR
hyperintensity within the periventricular and deep white matter both
cerebral hemispheres most consistent with chronic small vessel
ischemic disease, mild to moderate nature. Scattered areas of
encephalomalacia involving the right cerebral hemisphere, consistent
with chronic right MCA territory infarcts, mostly watershed in
nature.

Scattered areas of restricted diffusion seen involving the cortical
gray matter within the right parietal lobe (series 5, image 65,
posterior right temporal region (series 5, image 56), and right
insula (series 5, image 57), consistent with acute ischemic changes.
Minimal punctate cortical involvement at the posterior right frontal
region (series 5, image 68). No associated hemorrhage. Changes
involve right MCA and MCA watershed zones, likely due to known right
ICA stent occlusion. Additionally, there is mild diffusion
abnormality involving the anterior inferior right cingulate (series
5, image 61), likely related to hypoperfusion of the right ACA due
to stent occlusion. No associated hemorrhage.

No other evidence for acute or subacute ischemia. Gray-white matter
differentiation otherwise maintained. No evidence for acute
intracranial hemorrhage.

No mass lesion or midline shift. No hydrocephalus. No extra-axial
fluid collection. Incidental note made of a partially empty sella.

Vascular: Abnormal flow void within the right ICA to the level of
the supraclinoid segment, consistent with known occlusion. Major
intravascular flow voids otherwise maintained.

Skull and upper cervical spine: Craniocervical junction normal. No
focal marrow replacing lesion. Scalp soft tissues demonstrate no
acute finding.

Sinuses/Orbits: Patient status post ocular lens replacement
bilaterally. Chronic left maxillary sinusitis. No significant
mastoid effusion. Inner ear structures normal.

Other: None.
IMPRESSION: 1. Scattered areas of acute cortical ischemia involving the right
MCA territory and right MCA watershed zones, most likely due to
acute occlusion of proximal right ICA stent as seen on prior CTA. No
associated hemorrhage.
2. Additional mild right ACA ischemic changes involving the anterior
inferior right cingulate, likely due to hypoperfusion given
underlying hypoplastic right ACA in the setting of stent occlusion.
3. Abnormal flow void within the right ICA to the level of the
supraclinoid segment, consistent with previously identified
occlusion.
4. Underlying chronic watershed right MCA territory infarcts with
mild to moderate chronic small vessel ischemic disease.

## 2019-08-04 DIAGNOSIS — G4701 Insomnia due to medical condition: Secondary | ICD-10-CM | POA: Diagnosis not present

## 2019-08-08 DIAGNOSIS — L602 Onychogryphosis: Secondary | ICD-10-CM | POA: Diagnosis not present

## 2019-08-08 DIAGNOSIS — I739 Peripheral vascular disease, unspecified: Secondary | ICD-10-CM | POA: Diagnosis not present

## 2019-08-08 DIAGNOSIS — M24572 Contracture, left ankle: Secondary | ICD-10-CM | POA: Diagnosis not present

## 2019-08-09 DIAGNOSIS — J449 Chronic obstructive pulmonary disease, unspecified: Secondary | ICD-10-CM | POA: Diagnosis not present

## 2019-08-09 DIAGNOSIS — I69354 Hemiplegia and hemiparesis following cerebral infarction affecting left non-dominant side: Secondary | ICD-10-CM | POA: Diagnosis not present

## 2019-08-09 DIAGNOSIS — I10 Essential (primary) hypertension: Secondary | ICD-10-CM | POA: Diagnosis not present

## 2019-08-11 DIAGNOSIS — I2510 Atherosclerotic heart disease of native coronary artery without angina pectoris: Secondary | ICD-10-CM | POA: Diagnosis not present

## 2019-08-11 DIAGNOSIS — I5033 Acute on chronic diastolic (congestive) heart failure: Secondary | ICD-10-CM | POA: Diagnosis not present

## 2019-08-11 DIAGNOSIS — D649 Anemia, unspecified: Secondary | ICD-10-CM | POA: Diagnosis not present

## 2019-08-11 DIAGNOSIS — I10 Essential (primary) hypertension: Secondary | ICD-10-CM | POA: Diagnosis not present

## 2019-08-11 DIAGNOSIS — E559 Vitamin D deficiency, unspecified: Secondary | ICD-10-CM | POA: Diagnosis not present

## 2019-08-24 DIAGNOSIS — R1084 Generalized abdominal pain: Secondary | ICD-10-CM | POA: Diagnosis not present

## 2019-08-24 DIAGNOSIS — K921 Melena: Secondary | ICD-10-CM | POA: Diagnosis not present

## 2019-08-24 DIAGNOSIS — R8290 Unspecified abnormal findings in urine: Secondary | ICD-10-CM | POA: Diagnosis not present

## 2019-08-24 DIAGNOSIS — K5909 Other constipation: Secondary | ICD-10-CM | POA: Diagnosis not present

## 2019-08-24 DIAGNOSIS — D649 Anemia, unspecified: Secondary | ICD-10-CM | POA: Diagnosis not present

## 2019-08-25 DIAGNOSIS — D649 Anemia, unspecified: Secondary | ICD-10-CM | POA: Diagnosis not present

## 2019-08-25 DIAGNOSIS — I10 Essential (primary) hypertension: Secondary | ICD-10-CM | POA: Diagnosis not present

## 2019-08-25 DIAGNOSIS — R799 Abnormal finding of blood chemistry, unspecified: Secondary | ICD-10-CM | POA: Diagnosis not present

## 2019-08-25 DIAGNOSIS — I509 Heart failure, unspecified: Secondary | ICD-10-CM | POA: Diagnosis not present

## 2019-08-31 DIAGNOSIS — I69354 Hemiplegia and hemiparesis following cerebral infarction affecting left non-dominant side: Secondary | ICD-10-CM | POA: Diagnosis not present

## 2019-08-31 DIAGNOSIS — K921 Melena: Secondary | ICD-10-CM | POA: Diagnosis not present

## 2019-08-31 DIAGNOSIS — R600 Localized edema: Secondary | ICD-10-CM | POA: Diagnosis not present

## 2019-08-31 DIAGNOSIS — K5909 Other constipation: Secondary | ICD-10-CM | POA: Diagnosis not present

## 2019-08-31 DIAGNOSIS — D649 Anemia, unspecified: Secondary | ICD-10-CM | POA: Diagnosis not present

## 2019-09-01 DIAGNOSIS — G4701 Insomnia due to medical condition: Secondary | ICD-10-CM | POA: Diagnosis not present

## 2019-09-01 IMAGING — CT CT HEAD W/O CM
4 series · 16 of 47 positions shown, 18 images · non-contrast
Comparison: 10/25/2017

CLINICAL DATA: Altered mental status of unclear cause

EXAM:
CT HEAD WITHOUT CONTRAST
TECHNIQUE: Contiguous axial images were obtained from the base of the skull
through the vertex without intravenous contrast.

[Series 3: head bone · axial · 0.45mm/px · z∈[-84,-52]mm · 3 of 80 slices shown]
[im 8/80  bone]
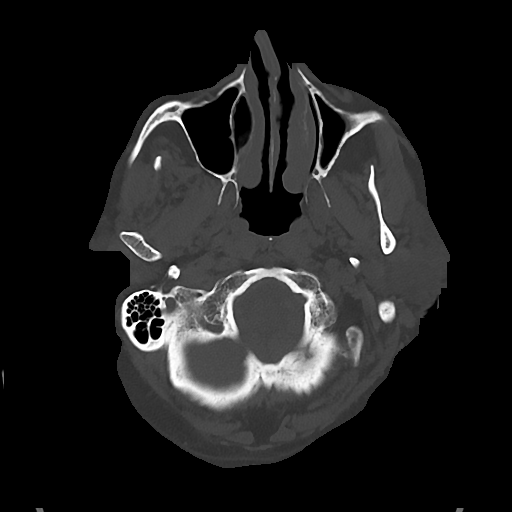
[im 16/80  bone]
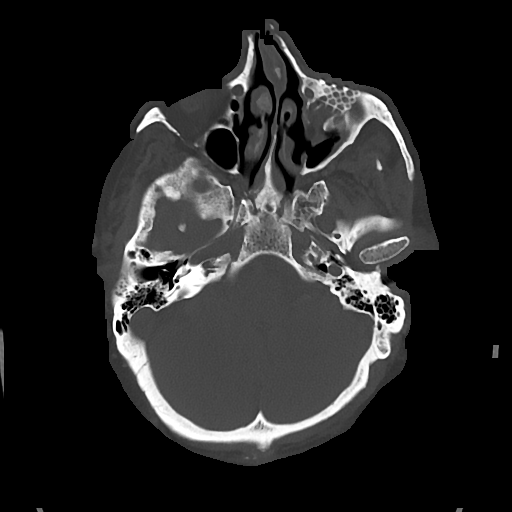
[im 24/80  bone]
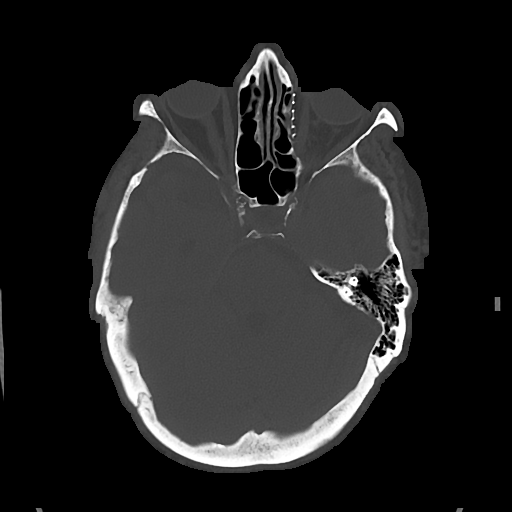

[Series 4: head wo · axial · 0.45mm/px · z∈[-83,+37]mm · 7 of 32 slices shown, 9 images]
[im 4/32  brain]
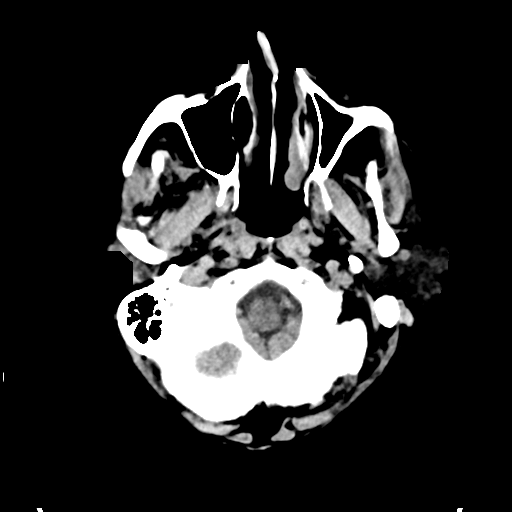
[im 4/32  bone]
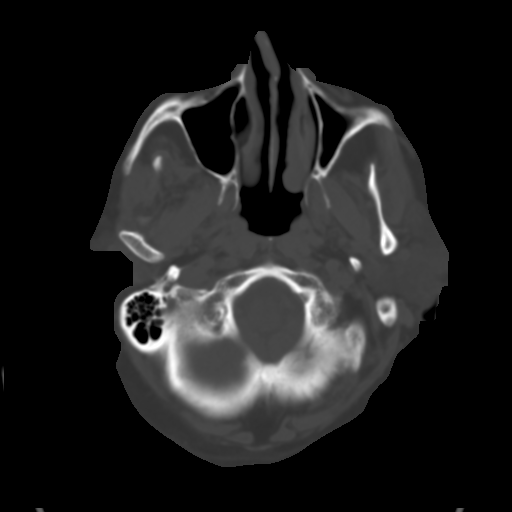
[im 8/32  brain]
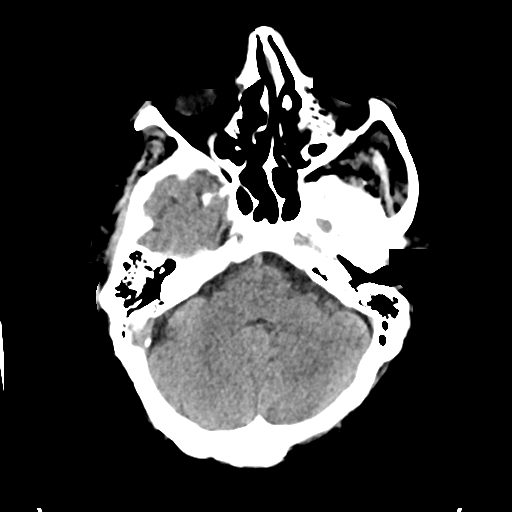
[im 12/32  brain]
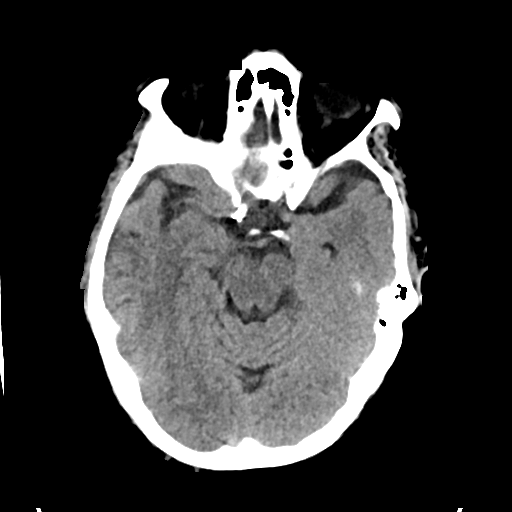
[im 16/32  brain]
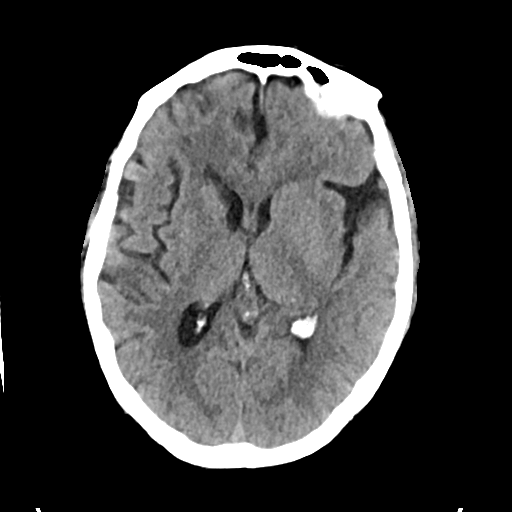
[im 20/32  brain]
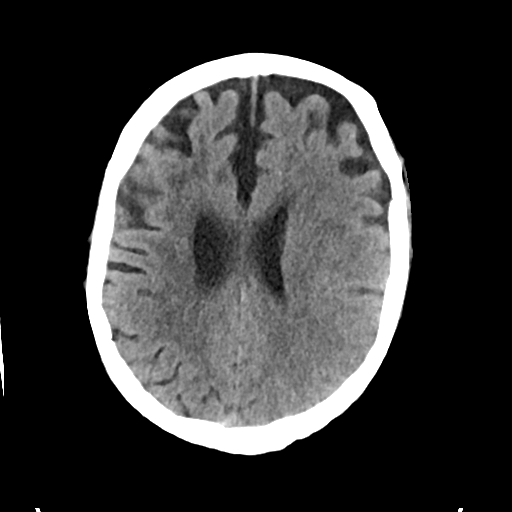
[im 20/32  bone]
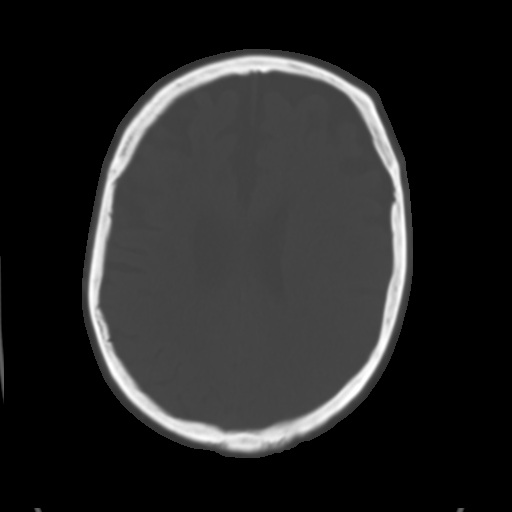
[im 24/32  brain]
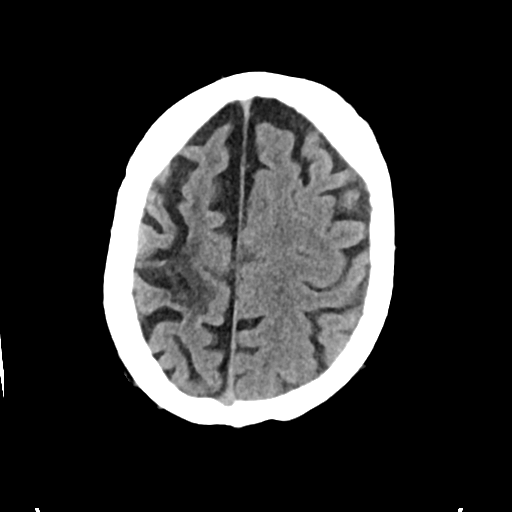
[im 28/32  brain]
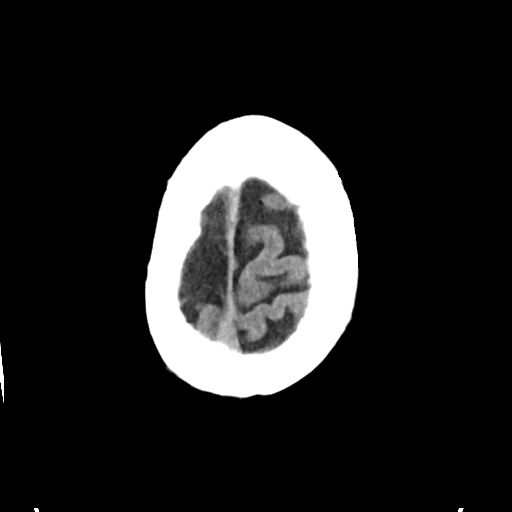

[Series 5: cor soft · coronal · 0.35mm/px · 3 of 64 slices shown]
[im 22/64  brain]
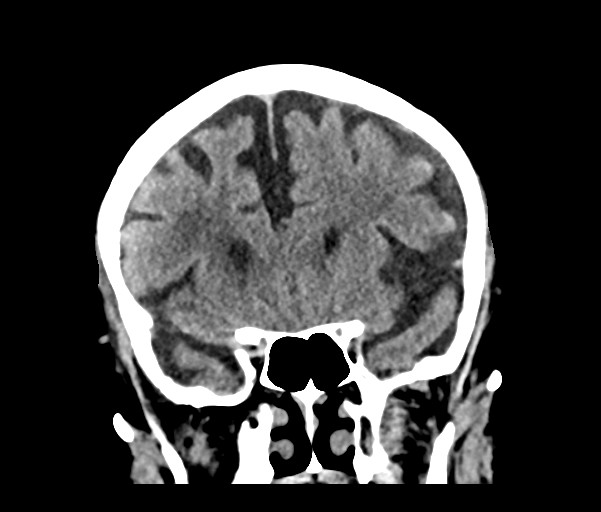
[im 29/64  brain]
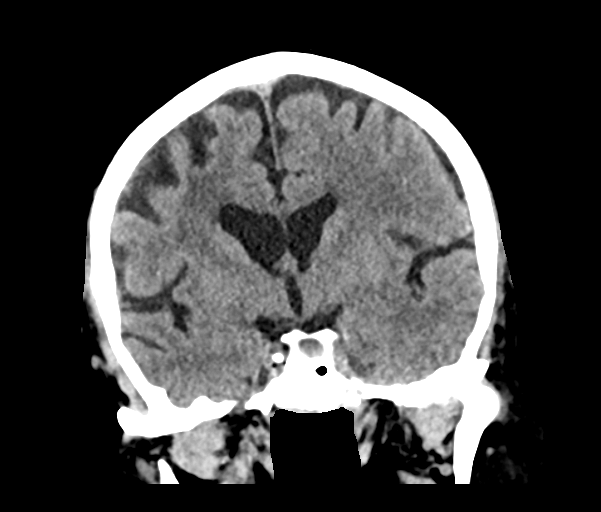
[im 36/64  brain]
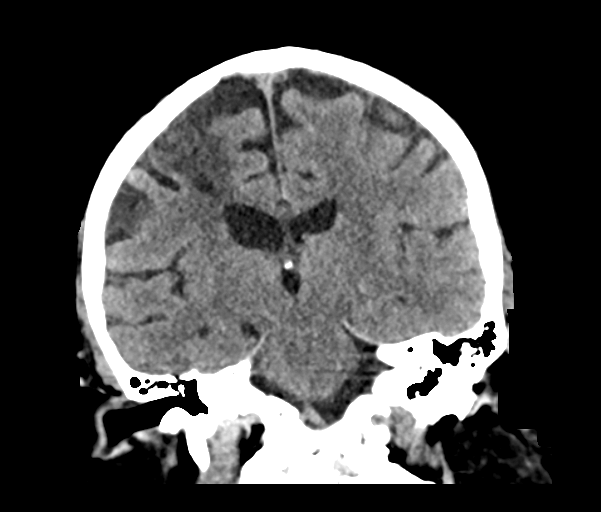

[Series 6: sag soft · sagittal · 0.35mm/px · 3 of 53 slices shown]
[im 18/53  brain]
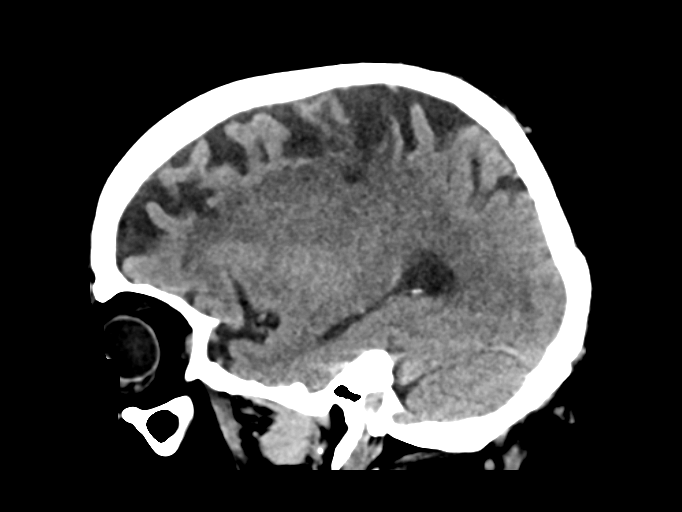
[im 27/53  brain]
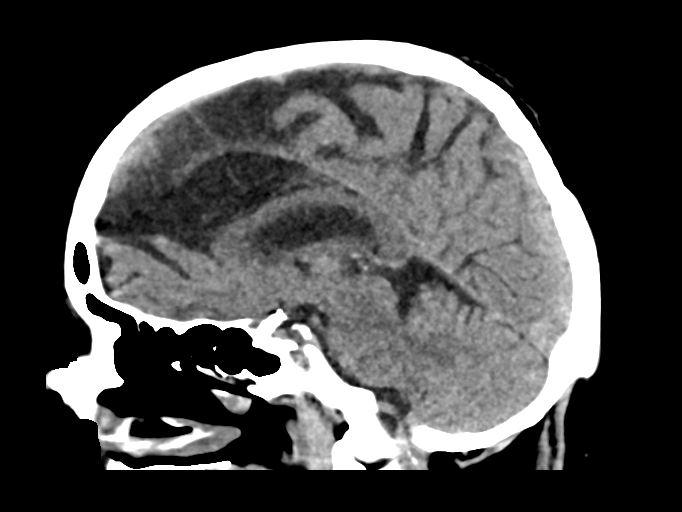
[im 35/53  brain]
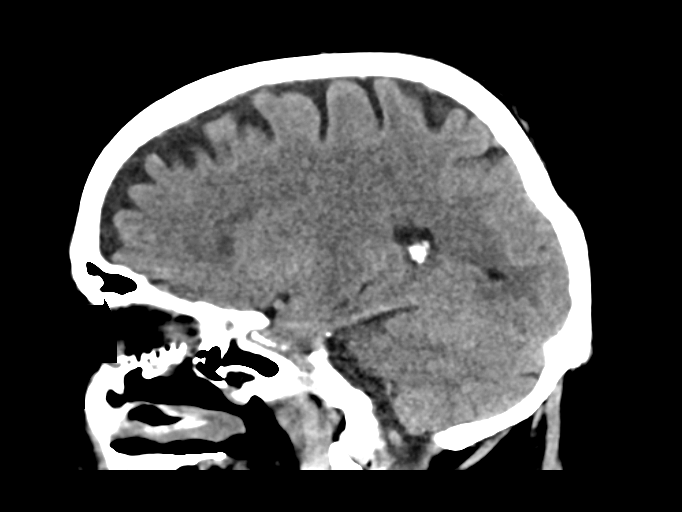

[16 of 47 positions shown; findings below may reference images not displayed]

FINDINGS: Brain: Remote right MCA/watershed distribution infarcts. No evidence
of acute infarct, acute hemorrhage, hydrocephalus, or collection.

Vascular: Right ICA stent.

Skull: No acute finding.

Sinuses/Orbits: Prior blowout fracture of the left orbital floor
with plating. Bilateral cataract resection. No acute finding
IMPRESSION: 1. No acute finding.
2. Remote right cerebral infarcts.

## 2019-09-02 IMAGING — CR DG SHOULDER 2+V*L*
3 series · 3 of 3 positions shown · non-contrast
Comparison: None.

CLINICAL DATA: Shoulder weakness

EXAM:
LEFT SHOULDER - 2+ VIEW

[shoulder grashey]
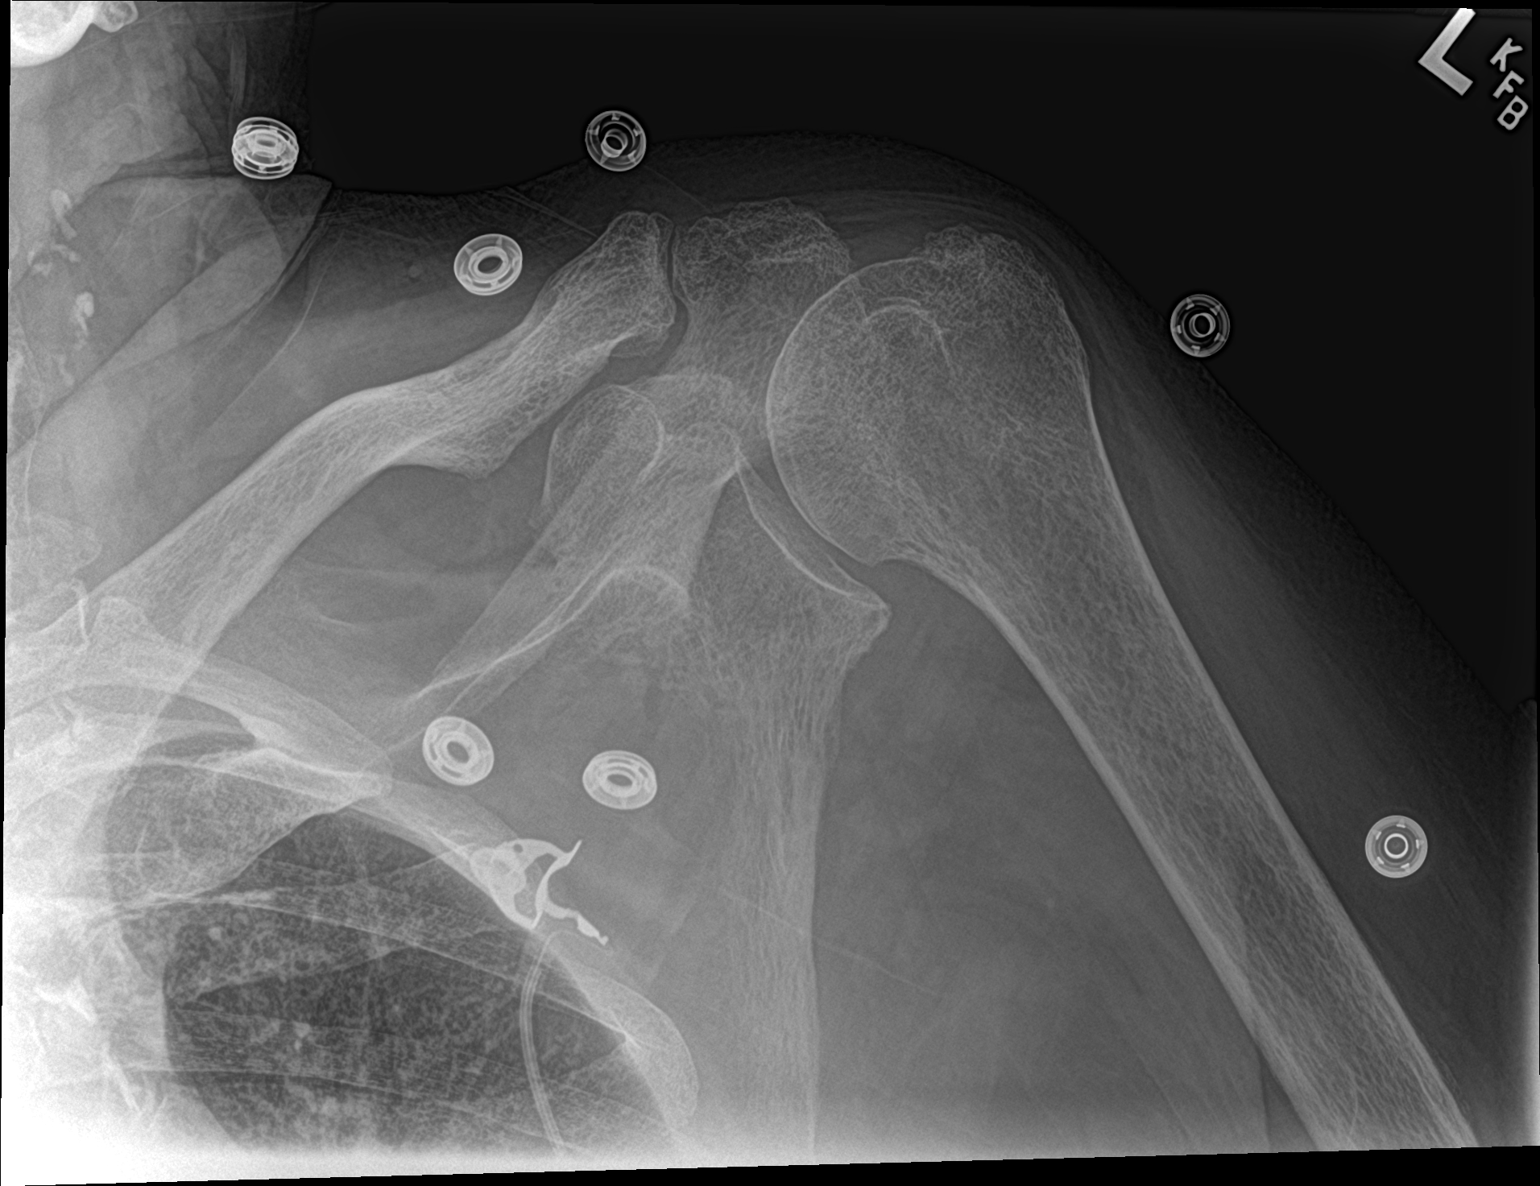

[shoulder y view]
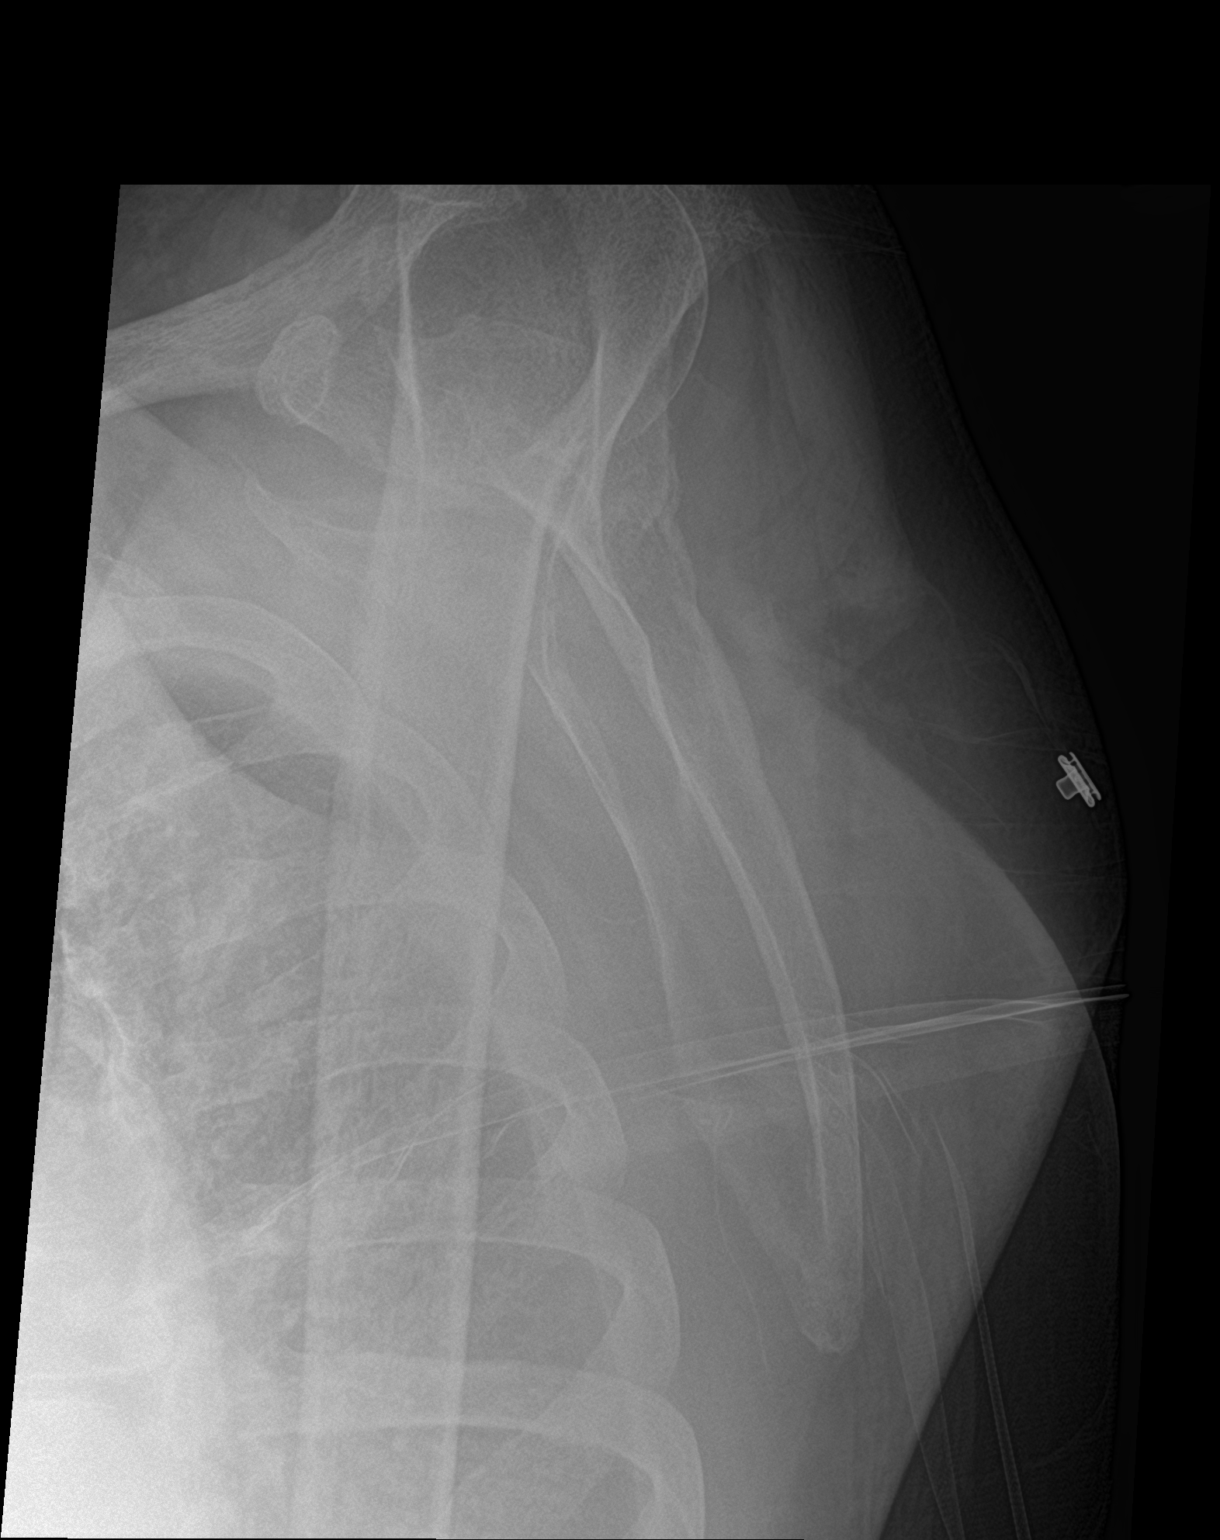

[shoulder ap neutral]
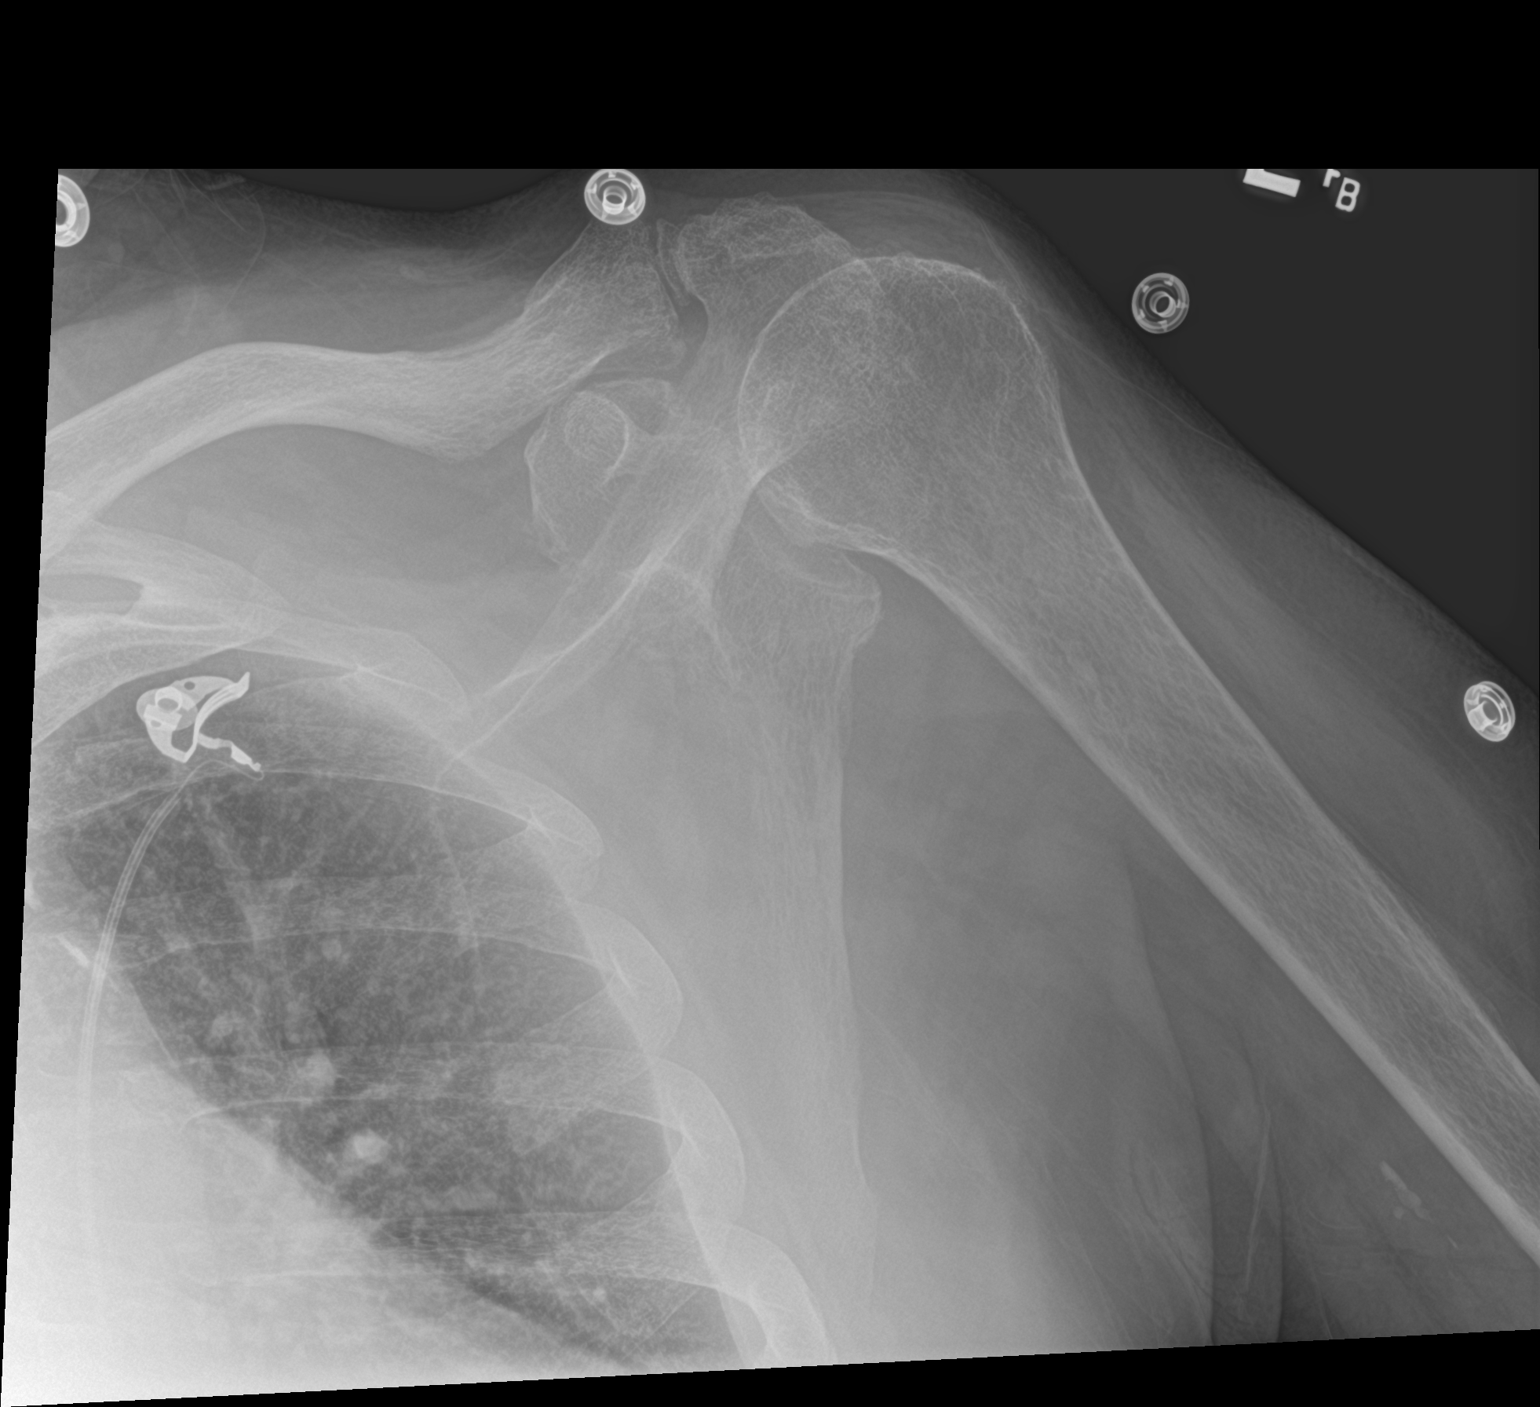

[3 of 3 positions shown; findings below may reference images not displayed]

FINDINGS: No fracture or malalignment. Moderate AC joint degenerative change.
Mild glenohumeral degenerative change. Humeral head appears slightly
high-riding.
IMPRESSION: 1. No acute osseous abnormality
2. Mild to moderate arthritis of the AC joint and glenohumeral
interval
3. Humeral head appears high-riding, possible rotator cuff disease.

## 2019-09-02 IMAGING — MR MR HEAD W/O CM
10 of 11 series · 42 of 48 positions shown · non-contrast
Comparison: Head CT 11/28/2017

Brain MRI 10/26/2017

CLINICAL DATA: Prior stroke with left-sided deficits. Found
unresponsive.

EXAM:
MRI HEAD WITHOUT CONTRAST
TECHNIQUE: Multiplanar, multiecho pulse sequences of the brain and surrounding
structures were obtained without intravenous contrast.

[Series 5: DWI · axial · 4.0mm · 0.88mm/px · z∈[-76,+64]mm · 8 of 74 slices shown (1 of 4)]
[im 1/74]
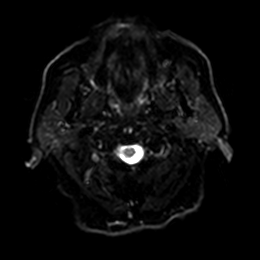
[im 11/74]
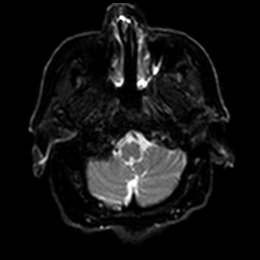
[im 21/74]
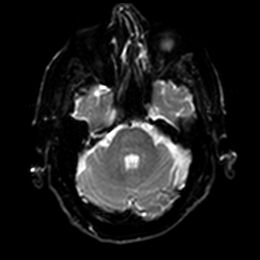
[im 32/74]
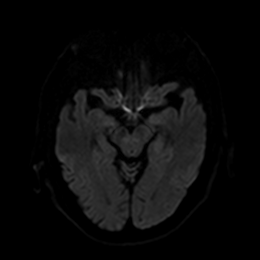
[im 42/74]
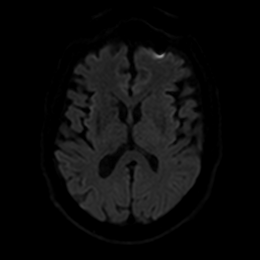
[im 53/74]
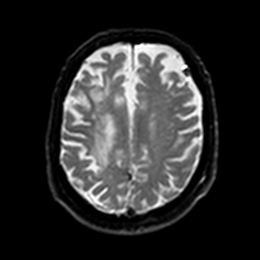
[im 63/74]
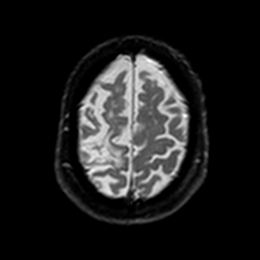
[im 74/74]
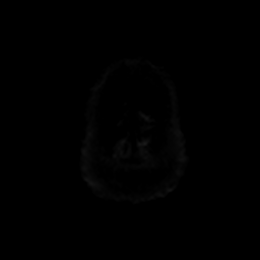

[Series 6: DWI · axial · 4.0mm · 0.88mm/px · z∈[-76,+64]mm · 4 of 37 slices shown (2 of 4)]
[im 1/37]
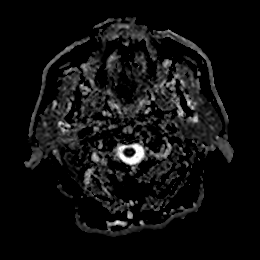
[im 13/37]
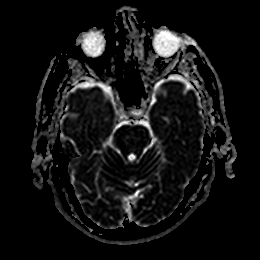
[im 25/37]
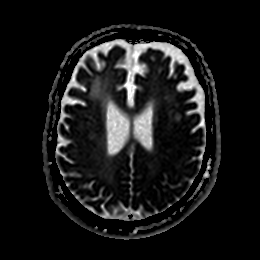
[im 37/37]
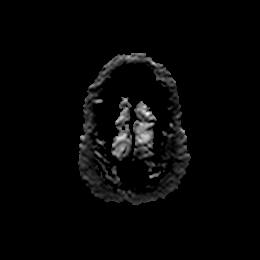

[Series 7: DWI · coronal · 4.0mm · 0.88mm/px · 7 of 70 slices shown (3 of 4)]
[im 1/70]
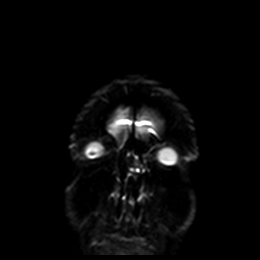
[im 12/70]
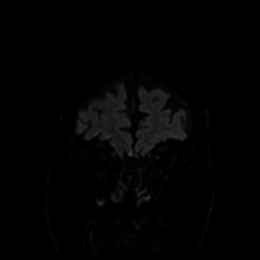
[im 24/70]
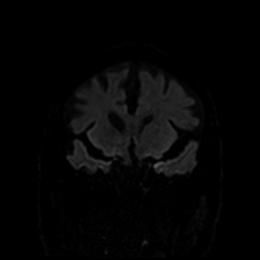
[im 35/70]
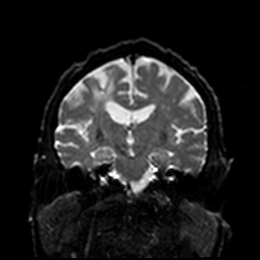
[im 47/70]
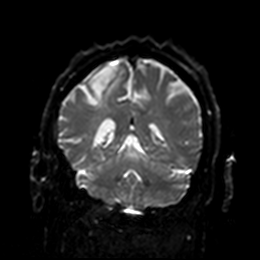
[im 58/70]
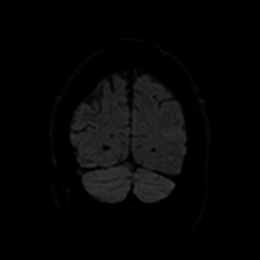
[im 70/70]
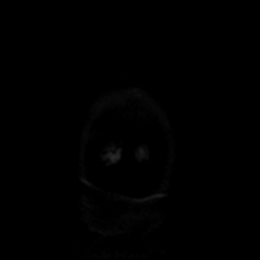

[Series 8: DWI · coronal · 4.0mm · 0.88mm/px · 3 of 35 slices shown (4 of 4)]
[im 1/35]
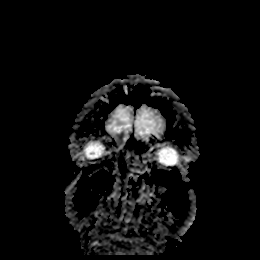
[im 18/35]
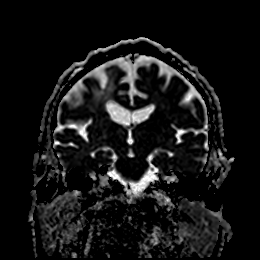
[im 35/35]
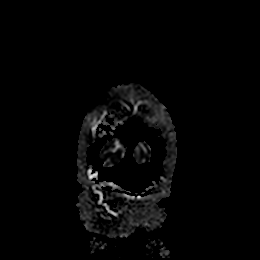

[Series 9: T1 · sagittal · 5.0mm · 0.75mm/px · 2 of 25 slices shown]
[im 1/25]
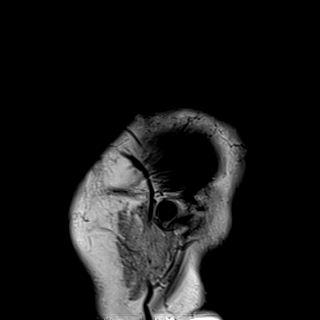
[im 25/25]
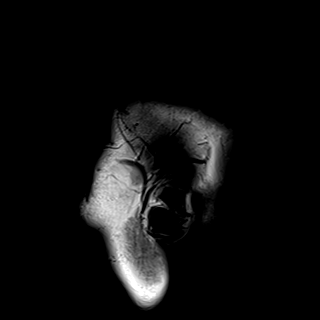

[Series 10: T2 · axial · 5.0mm · 0.72mm/px · z∈[-77,+64]mm · 2 of 25 slices shown (1 of 2)]
[im 1/25]
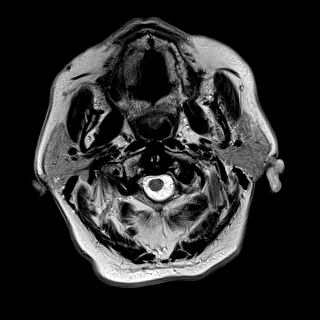
[im 25/25]
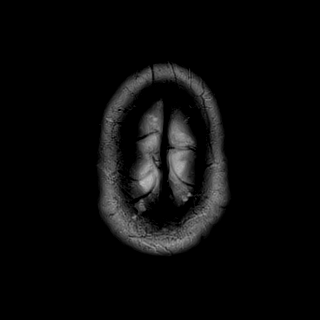

[Series 11: FLAIR · axial · 5.0mm · 0.45mm/px · z∈[-74,+66]mm · 2 of 25 slices shown]
[im 1/25]
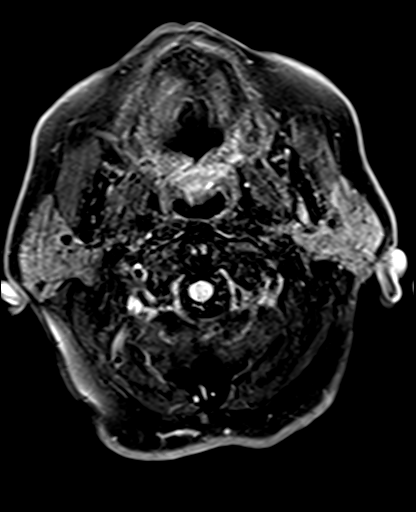
[im 25/25]
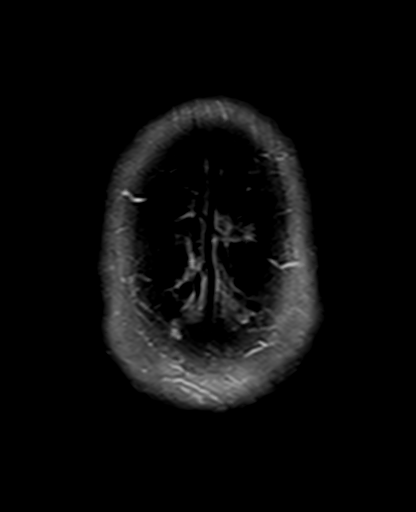

[Series 12: swi_images · axial · 3.0mm · 0.90mm/px · z∈[-82,+90]mm · 6 of 60 slices shown]
[im 1/60]
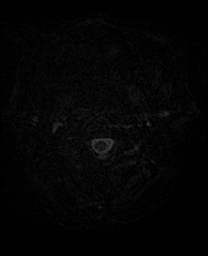
[im 12/60]
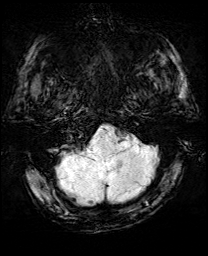
[im 24/60]
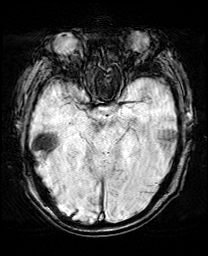
[im 36/60]
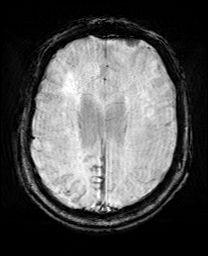
[im 48/60]
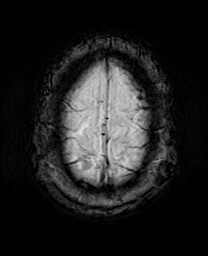
[im 60/60]
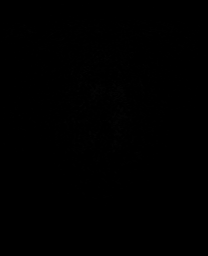

[Series 13: mip_images(sw) · axial · 24.0mm · 0.90mm/px · z∈[-72,+80]mm · 5 of 53 slices shown]
[im 1/53]
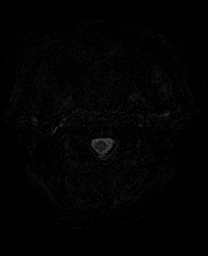
[im 14/53]
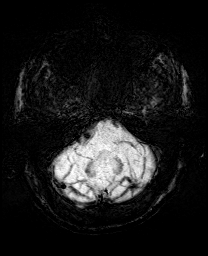
[im 27/53]
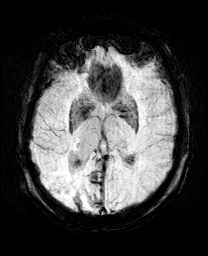
[im 40/53]
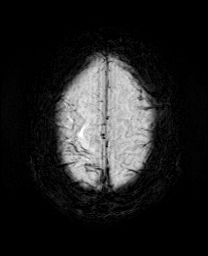
[im 53/53]
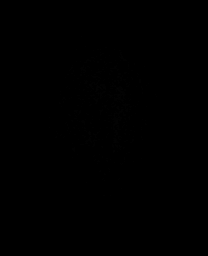

[Series 15: T2 · coronal · 5.0mm · 0.34mm/px · 3 of 29 slices shown (2 of 2)]
[im 1/29]
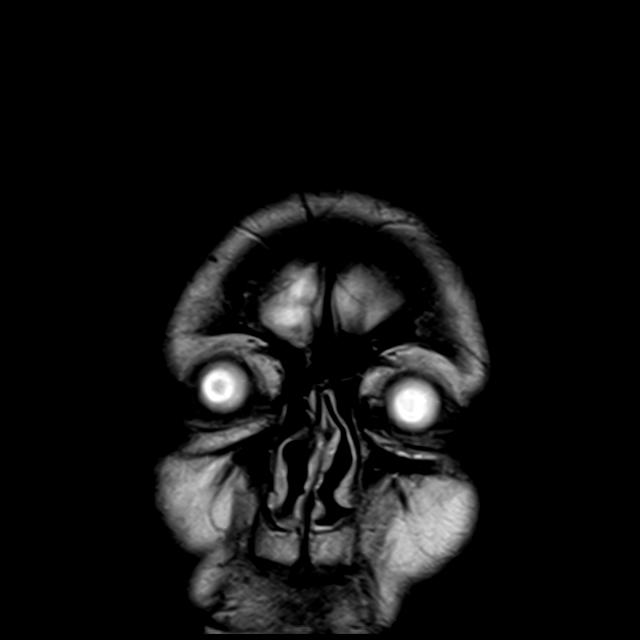
[im 15/29]
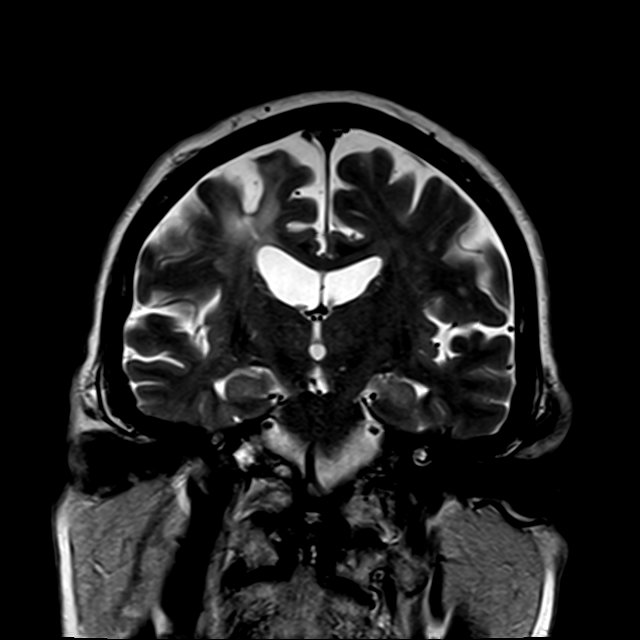
[im 29/29]
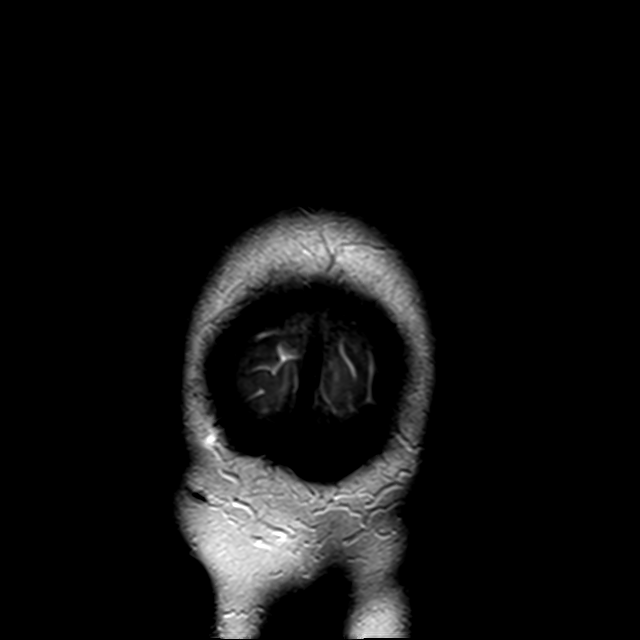

[42 of 48 positions shown; findings below may reference images not displayed]

FINDINGS: BRAIN: There is no acute infarct, acute hemorrhage, hydrocephalus or
extra-axial collection. The midline structures are normal. No
midline shift or other mass effect. Remote right frontal and
parietal infarcts. Early confluent hyperintense T2-weighted signal
of the periventricular and deep white matter, most commonly due to
chronic ischemic microangiopathy. Generalized atrophy without lobar
predilection. Focus of chronic microhemorrhage in the posterior
right temporal lobe.

VASCULAR: Loss of the normal right ICA flow void at the skull base,
unchanged.

SKULL AND UPPER CERVICAL SPINE: Calvarial bone marrow signal is
normal. There is no skull base mass. Visualized upper cervical spine
and soft tissues are normal.

SINUSES/ORBITS: No fluid levels or advanced mucosal thickening. No
mastoid or middle ear effusion. The orbits are normal.
IMPRESSION: 1. No acute intracranial abnormality.
2. Old right frontal and parietal infarcts.
3. Generalized atrophy and chronic ischemic microangiopathy.
4. Unchanged loss of normal right ICA flow void at the skull base.

## 2019-09-13 DIAGNOSIS — G8194 Hemiplegia, unspecified affecting left nondominant side: Secondary | ICD-10-CM | POA: Diagnosis not present

## 2019-09-13 DIAGNOSIS — J449 Chronic obstructive pulmonary disease, unspecified: Secondary | ICD-10-CM | POA: Diagnosis not present

## 2019-09-13 DIAGNOSIS — M6281 Muscle weakness (generalized): Secondary | ICD-10-CM | POA: Diagnosis not present

## 2019-09-13 DIAGNOSIS — I69354 Hemiplegia and hemiparesis following cerebral infarction affecting left non-dominant side: Secondary | ICD-10-CM | POA: Diagnosis not present

## 2019-09-13 DIAGNOSIS — I10 Essential (primary) hypertension: Secondary | ICD-10-CM | POA: Diagnosis not present

## 2019-09-14 DIAGNOSIS — G8194 Hemiplegia, unspecified affecting left nondominant side: Secondary | ICD-10-CM | POA: Diagnosis not present

## 2019-09-14 DIAGNOSIS — M6281 Muscle weakness (generalized): Secondary | ICD-10-CM | POA: Diagnosis not present

## 2019-09-15 DIAGNOSIS — G8194 Hemiplegia, unspecified affecting left nondominant side: Secondary | ICD-10-CM | POA: Diagnosis not present

## 2019-09-15 DIAGNOSIS — M6281 Muscle weakness (generalized): Secondary | ICD-10-CM | POA: Diagnosis not present

## 2019-09-18 DIAGNOSIS — M6281 Muscle weakness (generalized): Secondary | ICD-10-CM | POA: Diagnosis not present

## 2019-09-18 DIAGNOSIS — G8194 Hemiplegia, unspecified affecting left nondominant side: Secondary | ICD-10-CM | POA: Diagnosis not present

## 2019-09-19 DIAGNOSIS — M6281 Muscle weakness (generalized): Secondary | ICD-10-CM | POA: Diagnosis not present

## 2019-09-19 DIAGNOSIS — G8194 Hemiplegia, unspecified affecting left nondominant side: Secondary | ICD-10-CM | POA: Diagnosis not present

## 2019-09-20 DIAGNOSIS — G8194 Hemiplegia, unspecified affecting left nondominant side: Secondary | ICD-10-CM | POA: Diagnosis not present

## 2019-09-20 DIAGNOSIS — M6281 Muscle weakness (generalized): Secondary | ICD-10-CM | POA: Diagnosis not present

## 2019-09-21 DIAGNOSIS — M6281 Muscle weakness (generalized): Secondary | ICD-10-CM | POA: Diagnosis not present

## 2019-09-21 DIAGNOSIS — G8194 Hemiplegia, unspecified affecting left nondominant side: Secondary | ICD-10-CM | POA: Diagnosis not present

## 2019-09-22 DIAGNOSIS — M6281 Muscle weakness (generalized): Secondary | ICD-10-CM | POA: Diagnosis not present

## 2019-09-22 DIAGNOSIS — G8194 Hemiplegia, unspecified affecting left nondominant side: Secondary | ICD-10-CM | POA: Diagnosis not present

## 2019-09-25 DIAGNOSIS — M6281 Muscle weakness (generalized): Secondary | ICD-10-CM | POA: Diagnosis not present

## 2019-09-25 DIAGNOSIS — G8194 Hemiplegia, unspecified affecting left nondominant side: Secondary | ICD-10-CM | POA: Diagnosis not present

## 2019-09-26 DIAGNOSIS — G8194 Hemiplegia, unspecified affecting left nondominant side: Secondary | ICD-10-CM | POA: Diagnosis not present

## 2019-09-26 DIAGNOSIS — M6281 Muscle weakness (generalized): Secondary | ICD-10-CM | POA: Diagnosis not present

## 2019-09-27 DIAGNOSIS — M6281 Muscle weakness (generalized): Secondary | ICD-10-CM | POA: Diagnosis not present

## 2019-09-27 DIAGNOSIS — G8194 Hemiplegia, unspecified affecting left nondominant side: Secondary | ICD-10-CM | POA: Diagnosis not present

## 2019-09-28 DIAGNOSIS — M6281 Muscle weakness (generalized): Secondary | ICD-10-CM | POA: Diagnosis not present

## 2019-09-28 DIAGNOSIS — G8194 Hemiplegia, unspecified affecting left nondominant side: Secondary | ICD-10-CM | POA: Diagnosis not present

## 2019-09-29 DIAGNOSIS — G4701 Insomnia due to medical condition: Secondary | ICD-10-CM | POA: Diagnosis not present

## 2019-09-30 DIAGNOSIS — M6281 Muscle weakness (generalized): Secondary | ICD-10-CM | POA: Diagnosis not present

## 2019-09-30 DIAGNOSIS — G8194 Hemiplegia, unspecified affecting left nondominant side: Secondary | ICD-10-CM | POA: Diagnosis not present

## 2019-10-01 DIAGNOSIS — G8194 Hemiplegia, unspecified affecting left nondominant side: Secondary | ICD-10-CM | POA: Diagnosis not present

## 2019-10-01 DIAGNOSIS — M6281 Muscle weakness (generalized): Secondary | ICD-10-CM | POA: Diagnosis not present

## 2019-10-03 DIAGNOSIS — M6281 Muscle weakness (generalized): Secondary | ICD-10-CM | POA: Diagnosis not present

## 2019-10-03 DIAGNOSIS — G8194 Hemiplegia, unspecified affecting left nondominant side: Secondary | ICD-10-CM | POA: Diagnosis not present

## 2019-10-09 DIAGNOSIS — K219 Gastro-esophageal reflux disease without esophagitis: Secondary | ICD-10-CM | POA: Diagnosis not present

## 2019-10-09 DIAGNOSIS — I5032 Chronic diastolic (congestive) heart failure: Secondary | ICD-10-CM | POA: Diagnosis not present

## 2019-10-09 DIAGNOSIS — R600 Localized edema: Secondary | ICD-10-CM | POA: Diagnosis not present

## 2019-10-09 DIAGNOSIS — M25562 Pain in left knee: Secondary | ICD-10-CM | POA: Diagnosis not present

## 2019-10-10 DIAGNOSIS — M25762 Osteophyte, left knee: Secondary | ICD-10-CM | POA: Diagnosis not present

## 2019-10-10 DIAGNOSIS — M25562 Pain in left knee: Secondary | ICD-10-CM | POA: Diagnosis not present

## 2019-10-11 DIAGNOSIS — I5032 Chronic diastolic (congestive) heart failure: Secondary | ICD-10-CM | POA: Diagnosis not present

## 2019-10-11 DIAGNOSIS — R600 Localized edema: Secondary | ICD-10-CM | POA: Diagnosis not present

## 2019-10-11 DIAGNOSIS — K219 Gastro-esophageal reflux disease without esophagitis: Secondary | ICD-10-CM | POA: Diagnosis not present

## 2019-10-11 DIAGNOSIS — I69354 Hemiplegia and hemiparesis following cerebral infarction affecting left non-dominant side: Secondary | ICD-10-CM | POA: Diagnosis not present

## 2019-10-13 DIAGNOSIS — I509 Heart failure, unspecified: Secondary | ICD-10-CM | POA: Diagnosis not present

## 2019-10-16 IMAGING — CT CT HEAD W/O CM
3 series · 15 of 47 positions shown, 18 images · non-contrast
Comparison: MRI 11/29/2017

CLINICAL DATA: Altered level of consciousness

EXAM:
CT HEAD WITHOUT CONTRAST
TECHNIQUE: Contiguous axial images were obtained from the base of the skull
through the vertex without intravenous contrast.

[Series 3: head 5.0 h30s · axial · 0.42mm/px · z∈[-70,+65]mm · 9 of 33 slices shown, 12 images]
[im 3/33  brain]
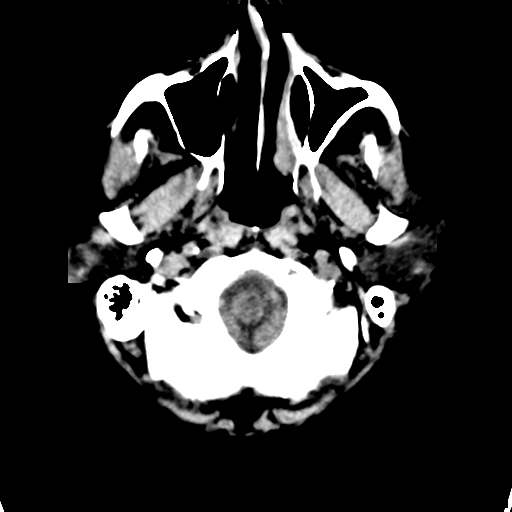
[im 3/33  bone]
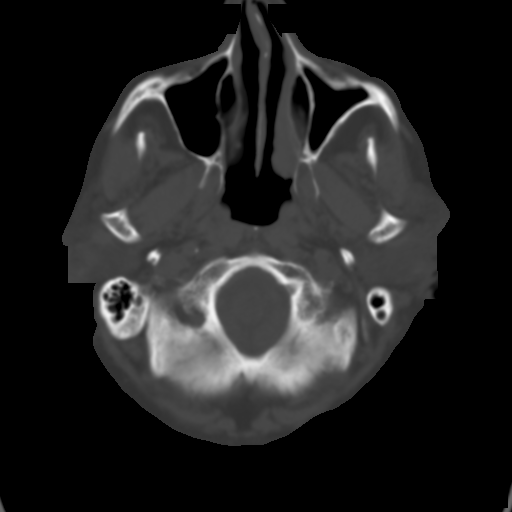
[im 6/33  brain]
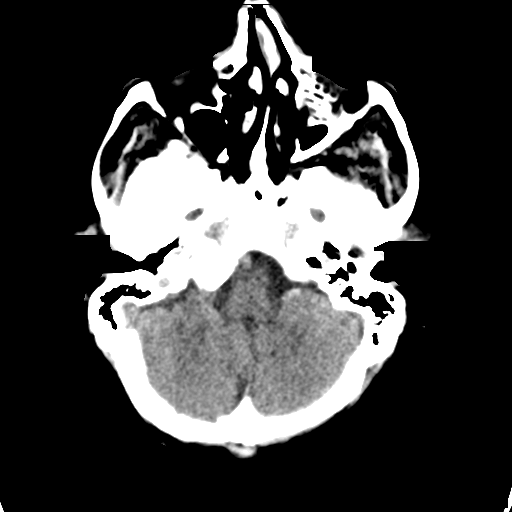
[im 9/33  brain]
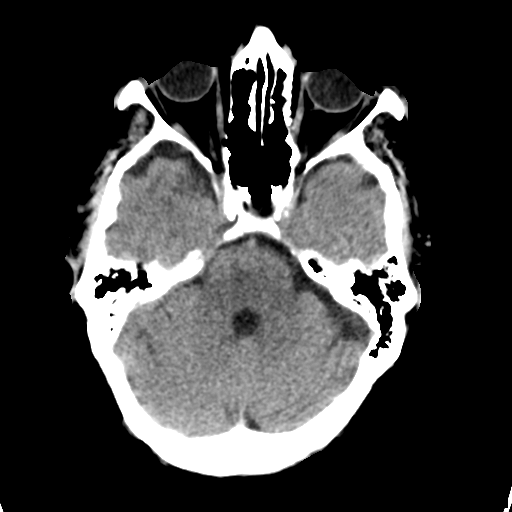
[im 13/33  brain]
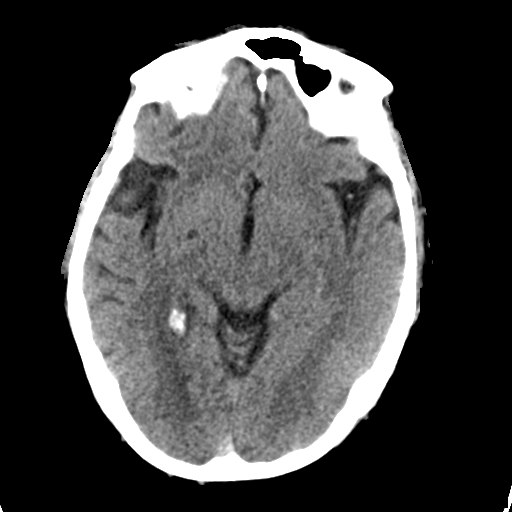
[im 17/33  brain]
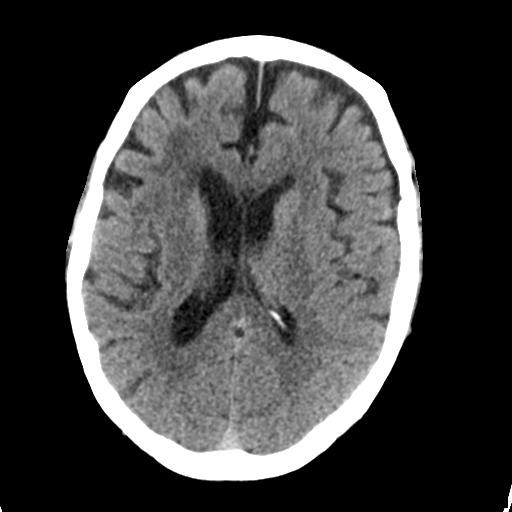
[im 17/33  bone]
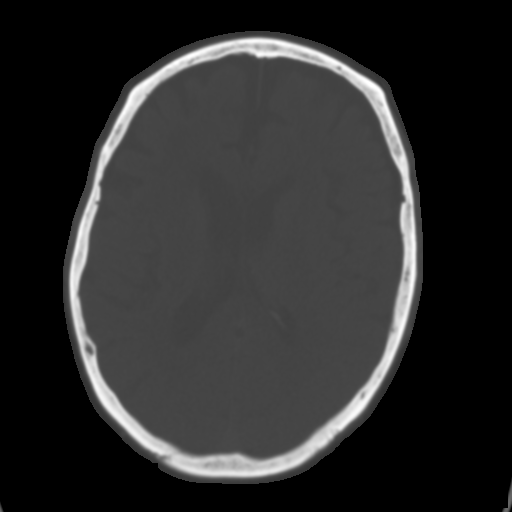
[im 20/33  brain]
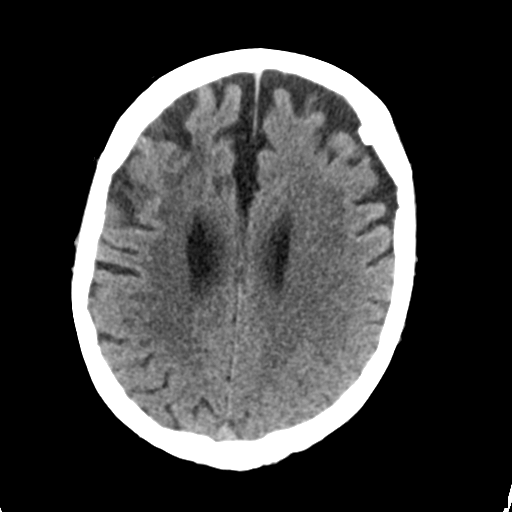
[im 24/33  brain]
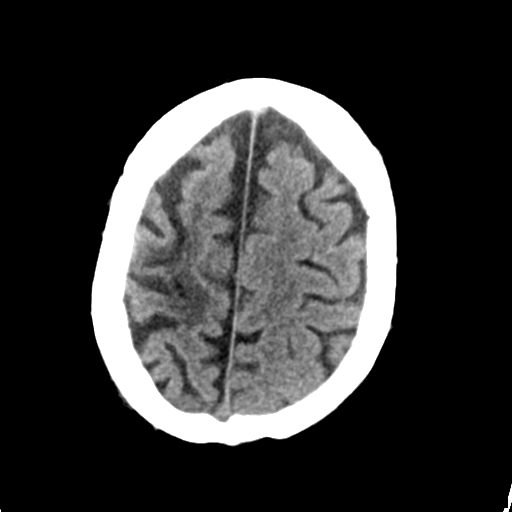
[im 27/33  brain]
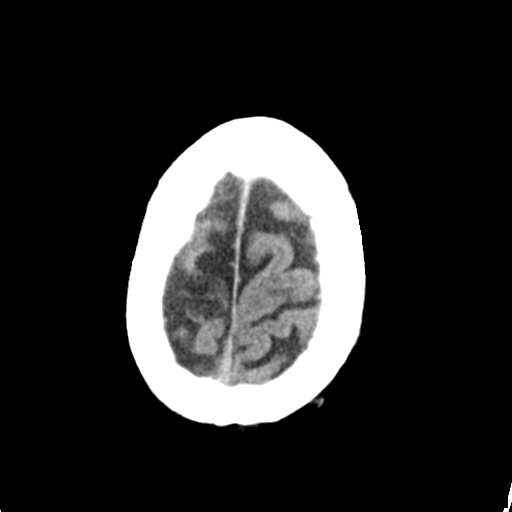
[im 30/33  brain]
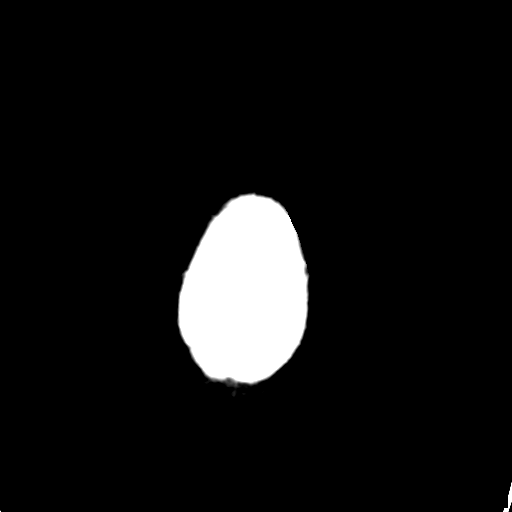
[im 30/33  bone]
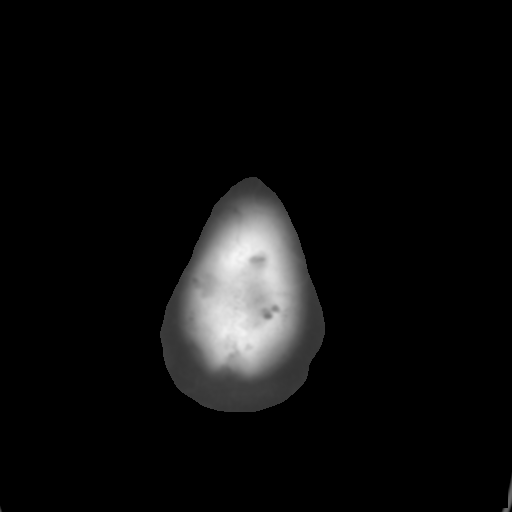

[Series 5: head 3.0 mpr cor · coronal · 0.32mm/px · 3 of 68 slices shown]
[im 23/68  brain]
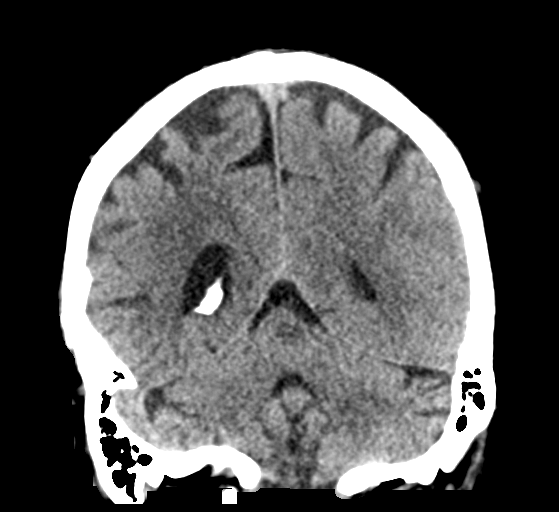
[im 30/68  brain]
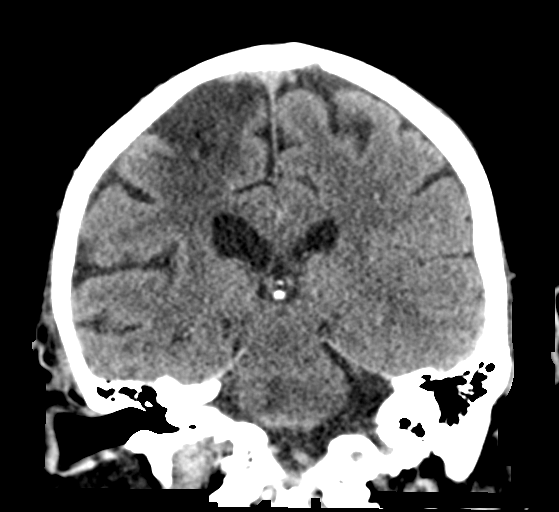
[im 38/68  brain]
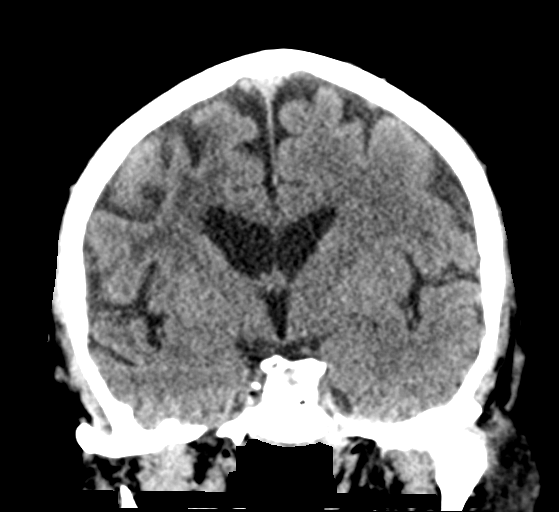

[Series 6: head 3.0 mpr sag · sagittal · 0.32mm/px · 3 of 64 slices shown]
[im 22/64  brain]
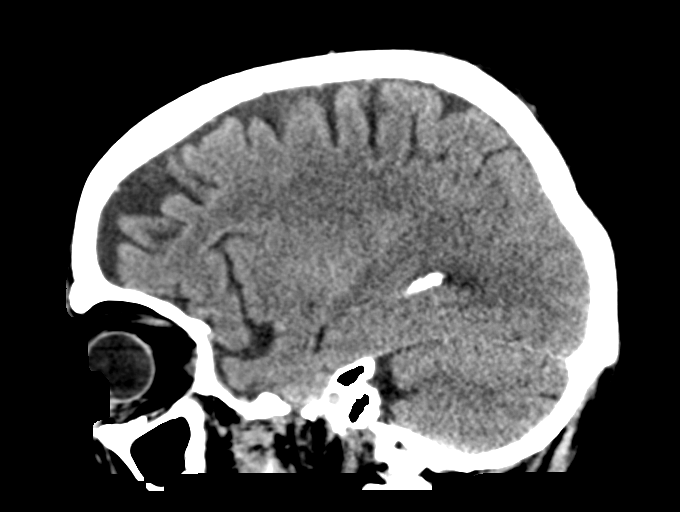
[im 32/64  brain]
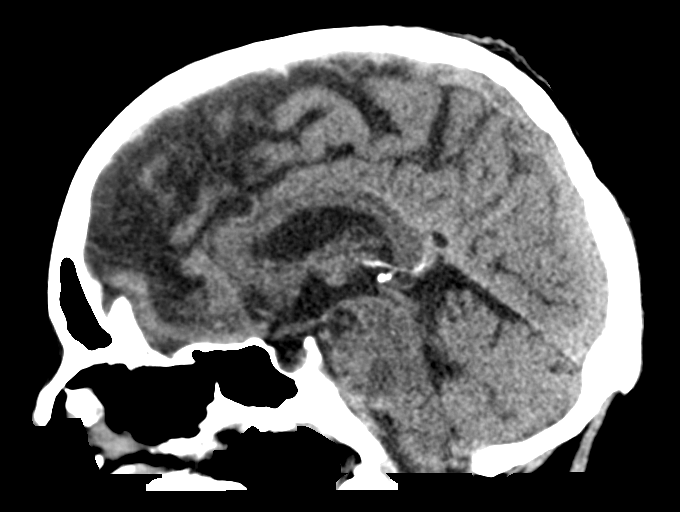
[im 43/64  brain]
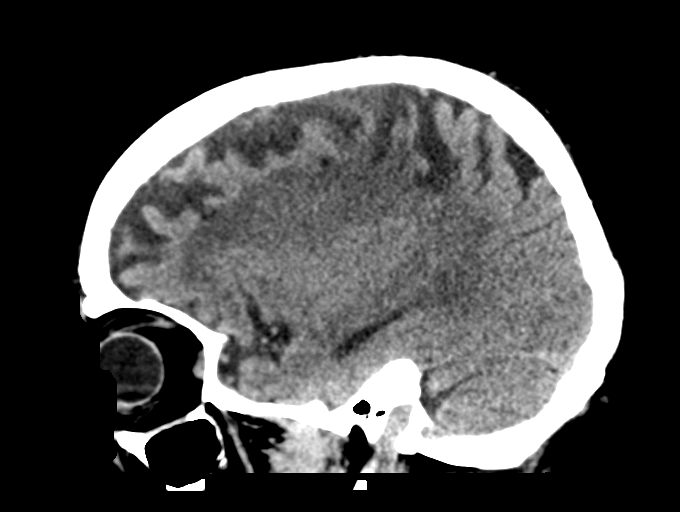

[15 of 47 positions shown; findings below may reference images not displayed]

FINDINGS: Brain: Old right frontoparietal infarct, stable. There is atrophy
and chronic small vessel disease changes. No acute intracranial
abnormality. Specifically, no hemorrhage, hydrocephalus, mass
lesion, acute infarction, or significant intracranial injury.

Vascular: No hyperdense vessel or unexpected calcification.

Skull: No acute calvarial abnormality.

Sinuses/Orbits: No acute finding. Postoperative changes on the
medial left orbital wall and orbital floor with mesh, stable.

Other: None
IMPRESSION: Old right frontal/parietal infarcts.

No acute intracranial abnormality.

Atrophy, chronic microvascular disease.

## 2019-10-16 IMAGING — DX DG CHEST 2V
2 series · 2 of 2 positions shown · non-contrast
Comparison: Chest radiograph dated 11/28/2017

CLINICAL DATA: 67-year-old male with weakness.

EXAM:
CHEST - 2 VIEW

[w chest lat]
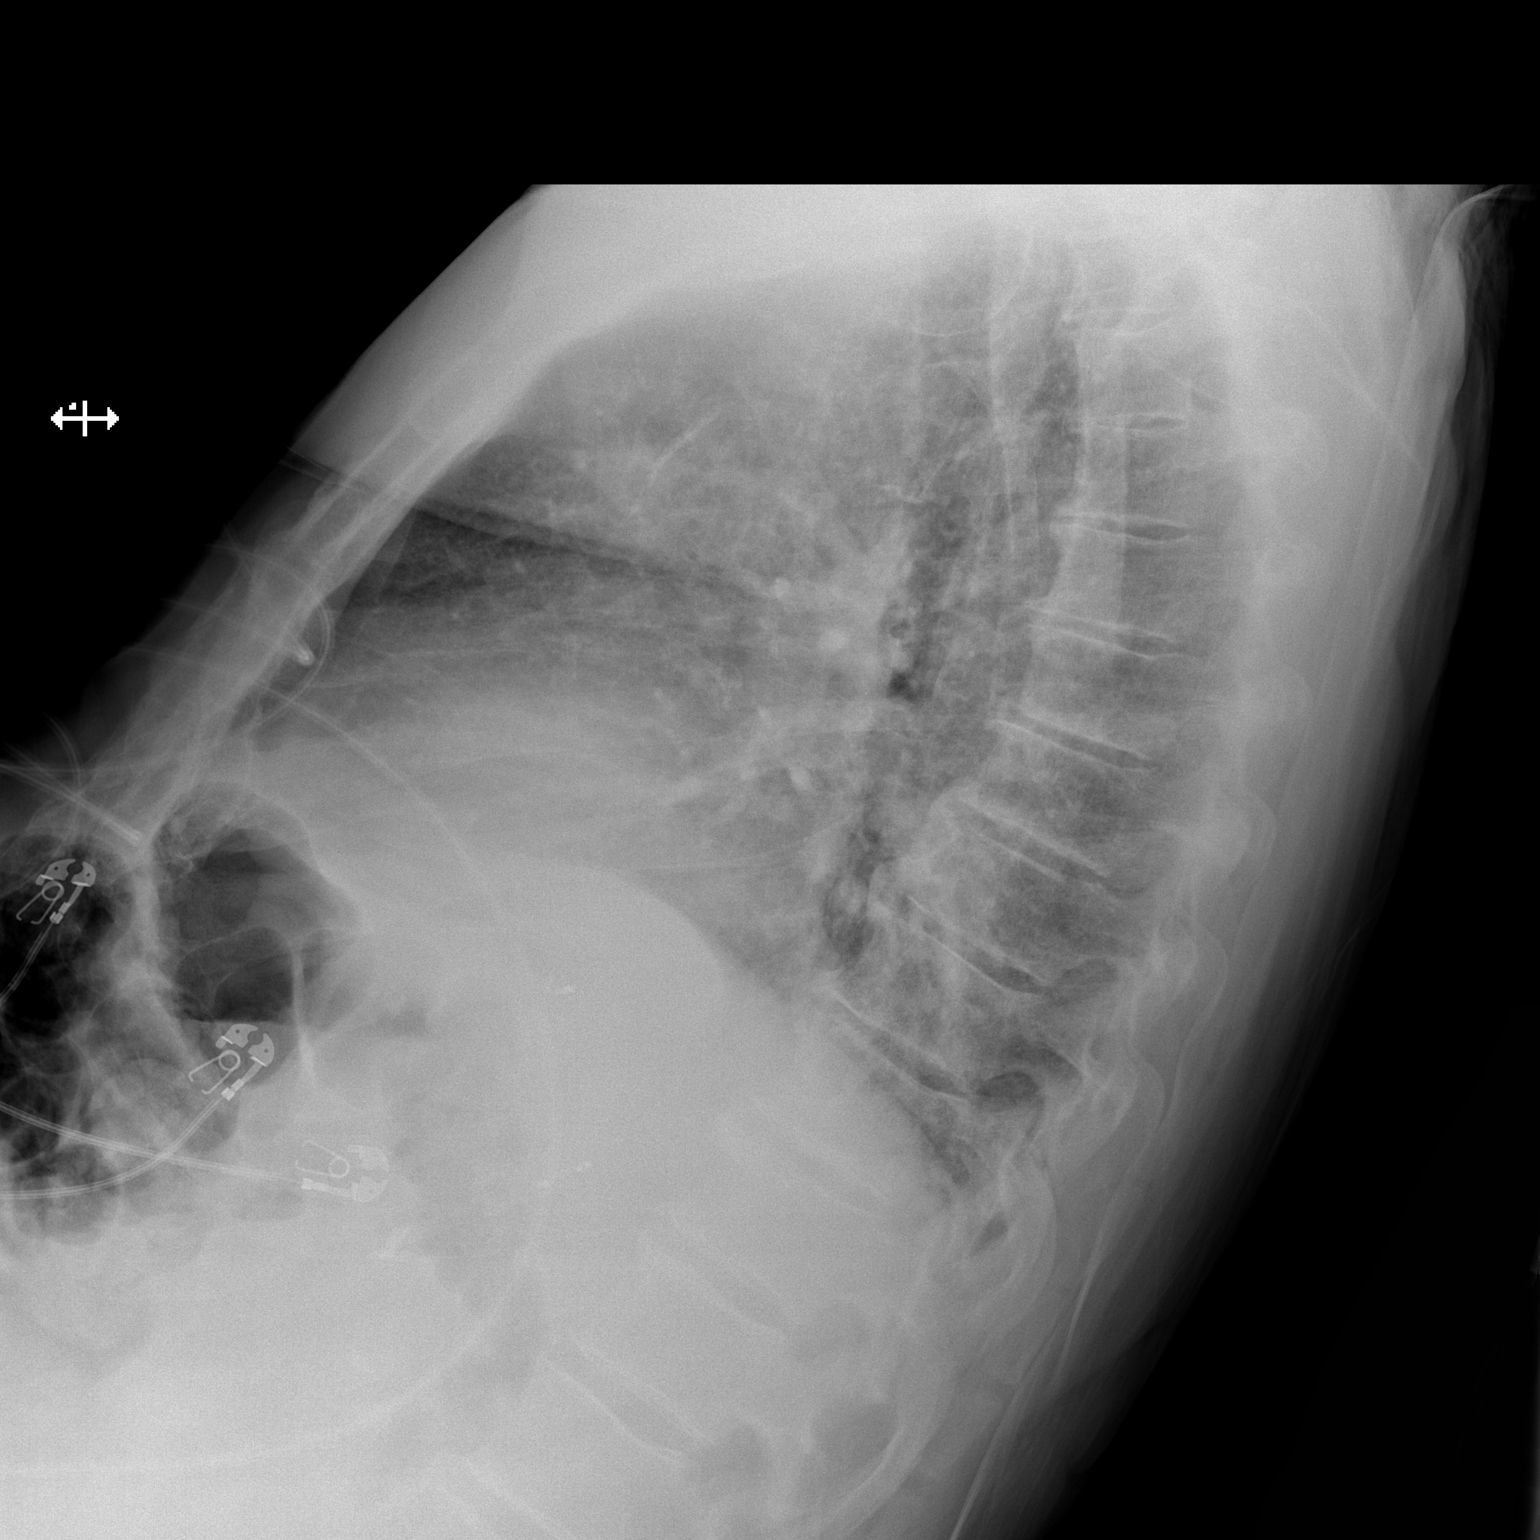

[x chest ap]
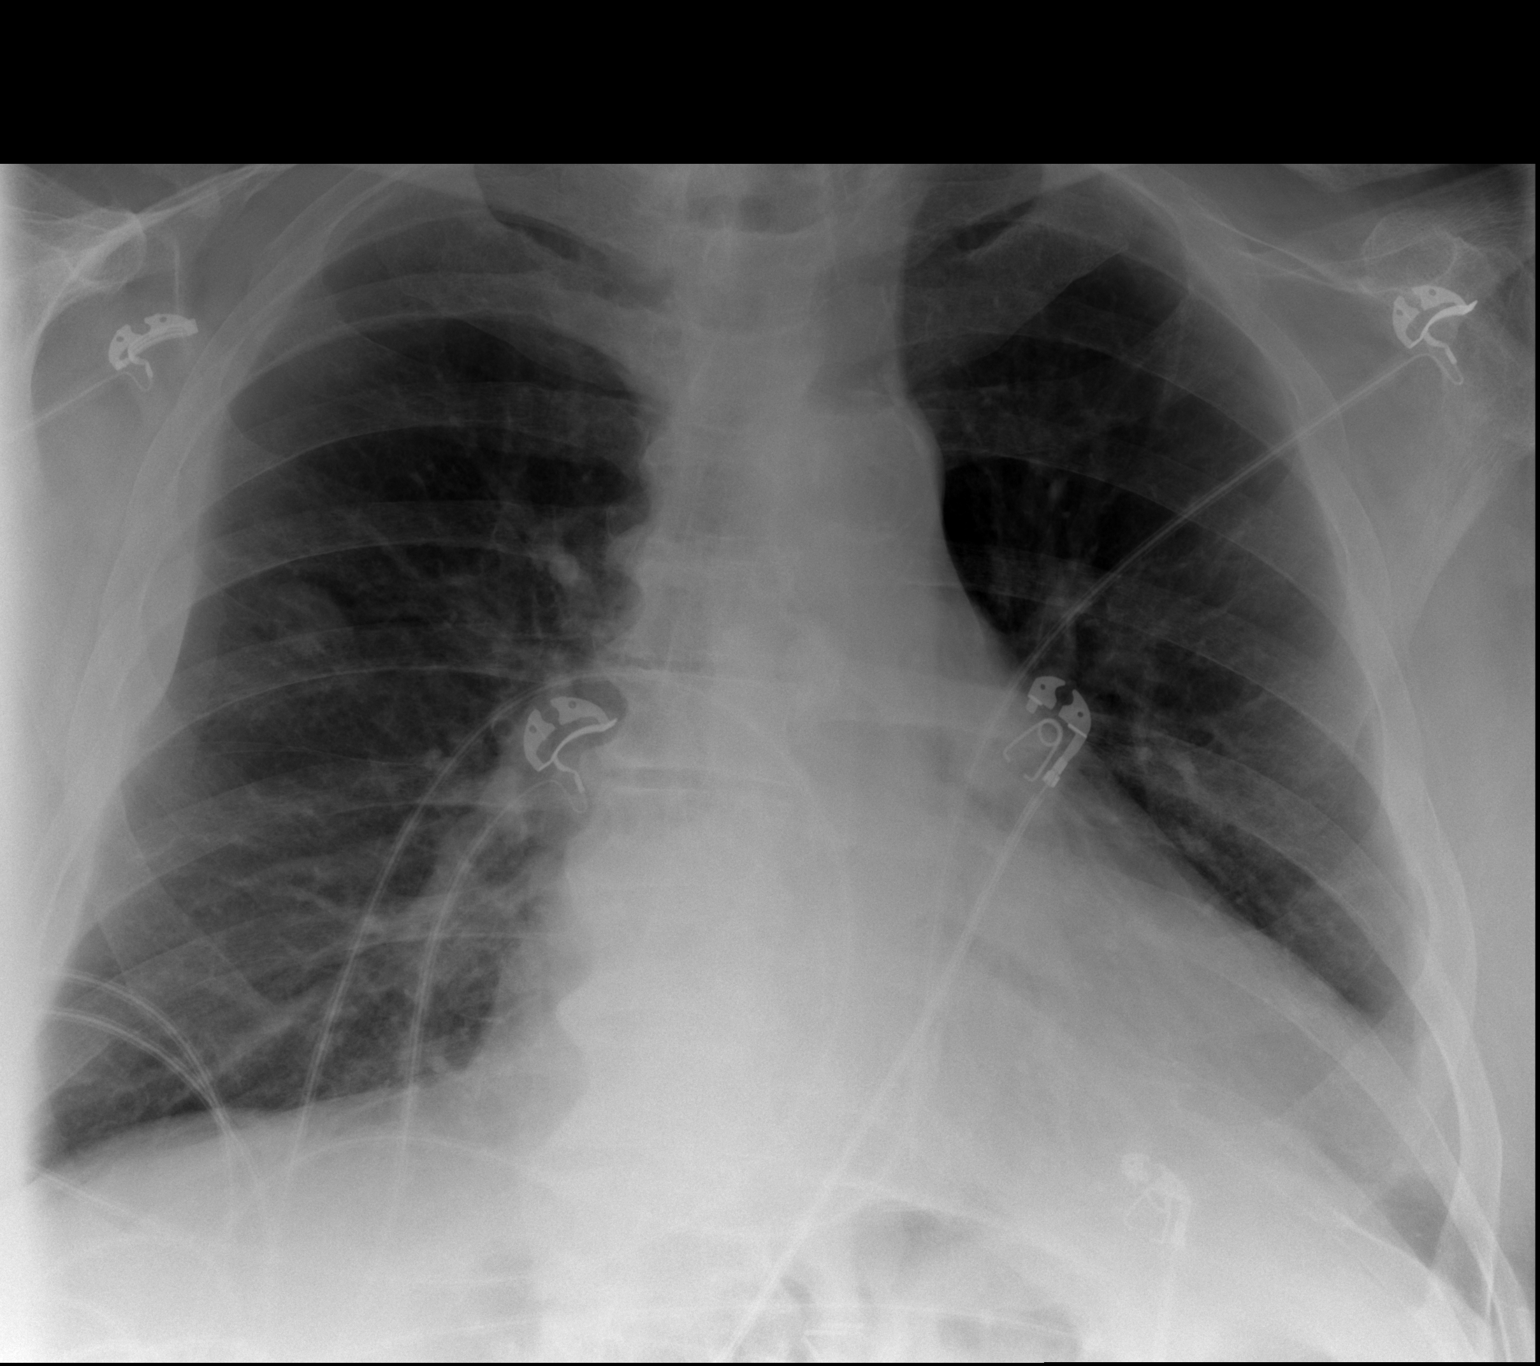

[2 of 2 positions shown; findings below may reference images not displayed]

FINDINGS: There is no new consolidation, pleural effusion, or pneumothorax.
Bibasilar atelectasis noted. There is pleural thickening or a small
loculated collection along the right lateral pleural surface,
similar to prior radiograph. A 9 mm right upper lobe nodule again
noted as seen previously. There is cardiomegaly. No acute osseous
pathology..
IMPRESSION: 1. No acute cardiopulmonary process.
2. Cardiomegaly.
3. Small loculated pleural effusion versus pleural thickening along
the right lateral pleural surface.
4. A 9 mm right upper lobe nodule as seen previously. Chest CT may
provide better evaluation on a nonemergent basis.

## 2019-10-17 IMAGING — MR MR HEAD W/O CM
10 of 11 series · 43 of 48 positions shown · non-contrast
Comparison: CT HEAD January 12, 2018 and MRI of the [facility]

CLINICAL DATA: LEFT-sided weakness after fall. History of stroke,
alcohol abuse, hypertension, hyperlipidemia and diabetes.

EXAM:
MRI HEAD WITHOUT CONTRAST
TECHNIQUE: Multiplanar, multiecho pulse sequences of the brain and surrounding
structures were obtained without intravenous contrast.

[Series 5: DWI · axial · 3.0mm · 0.88mm/px · z∈[-81,+60]mm · 10 of 98 slices shown (1 of 4)]
[im 1/98]
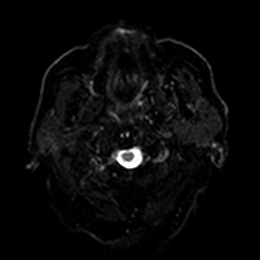
[im 11/98]
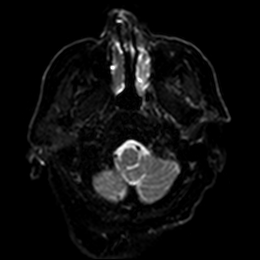
[im 22/98]
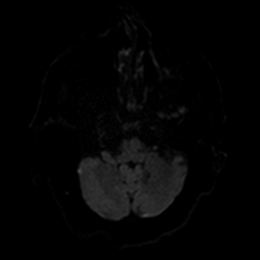
[im 33/98]
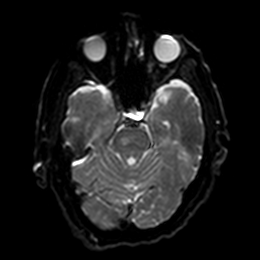
[im 44/98]
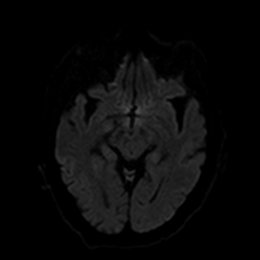
[im 54/98]
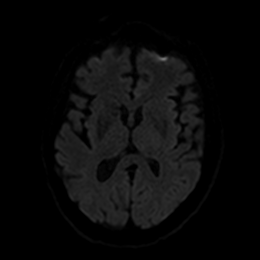
[im 65/98]
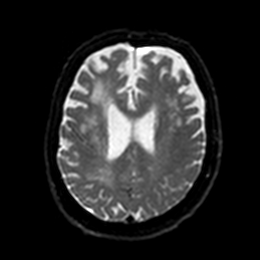
[im 76/98]
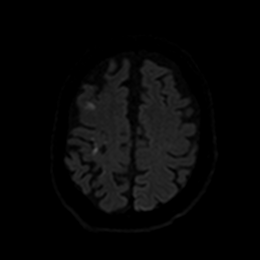
[im 87/98]
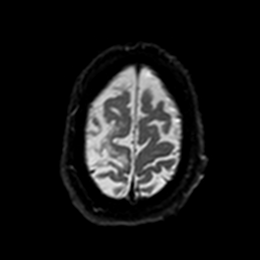
[im 98/98]
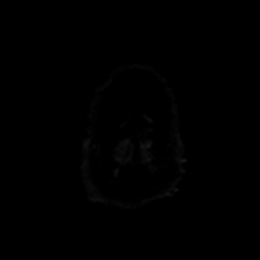

[Series 6: DWI · axial · 3.0mm · 0.88mm/px · z∈[-81,+60]mm · 5 of 49 slices shown (2 of 4)]
[im 1/49]
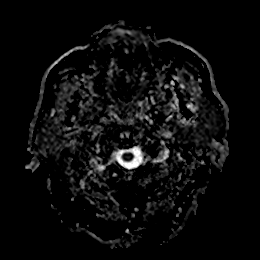
[im 13/49]
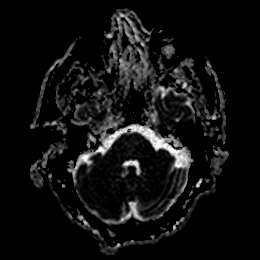
[im 25/49]
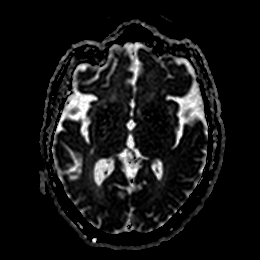
[im 37/49]
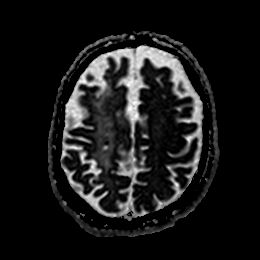
[im 49/49]
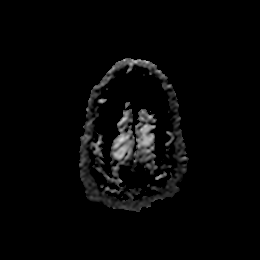

[Series 7: DWI · coronal · 4.0mm · 0.88mm/px · 6 of 70 slices shown (3 of 4)]
[im 1/70]
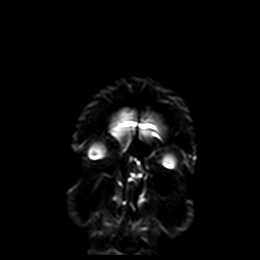
[im 14/70]
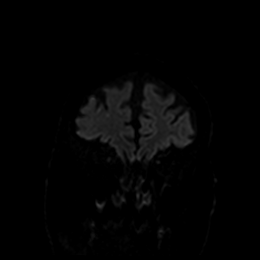
[im 28/70]
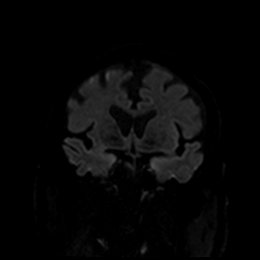
[im 42/70]
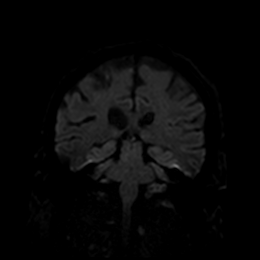
[im 56/70]
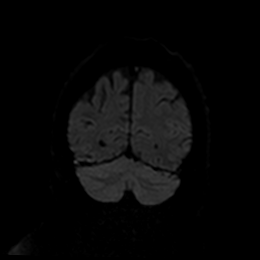
[im 70/70]
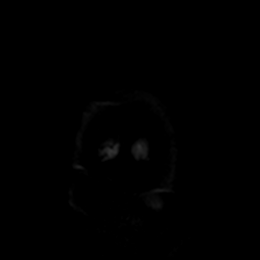

[Series 8: DWI · coronal · 4.0mm · 0.88mm/px · 3 of 35 slices shown (4 of 4)]
[im 1/35]
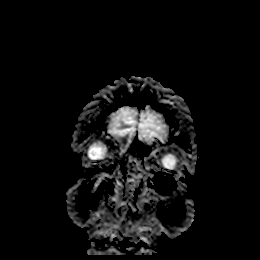
[im 18/35]
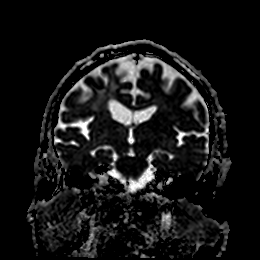
[im 35/35]
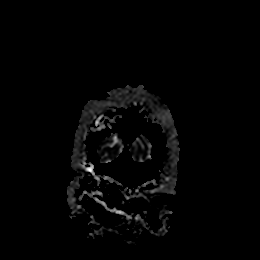

[Series 9: T1 · sagittal · 5.0mm · 0.75mm/px · 2 of 23 slices shown]
[im 1/23]
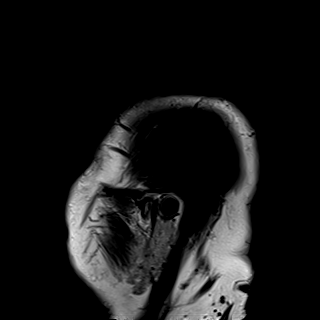
[im 23/23]
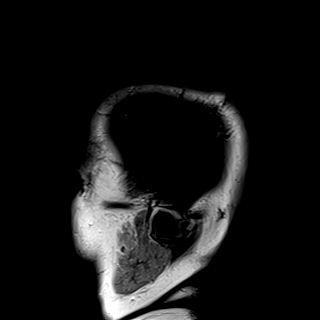

[Series 10: T2 · axial · 5.0mm · 0.72mm/px · z∈[-80,+61]mm · 2 of 25 slices shown (1 of 2)]
[im 1/25]
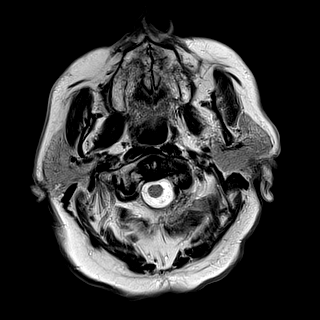
[im 25/25]
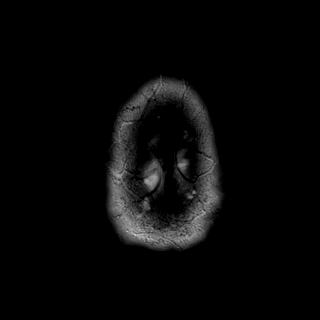

[Series 11: FLAIR · axial · 5.0mm · 0.45mm/px · z∈[-75,+66]mm · 2 of 25 slices shown]
[im 1/25]
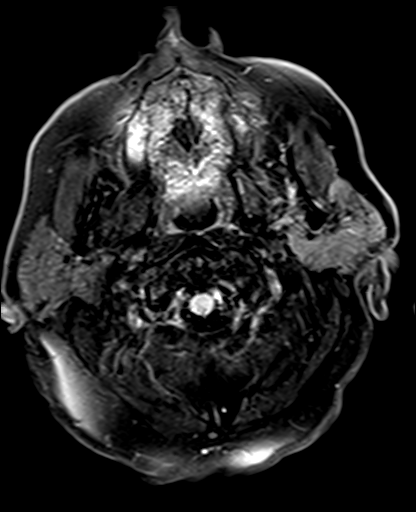
[im 25/25]
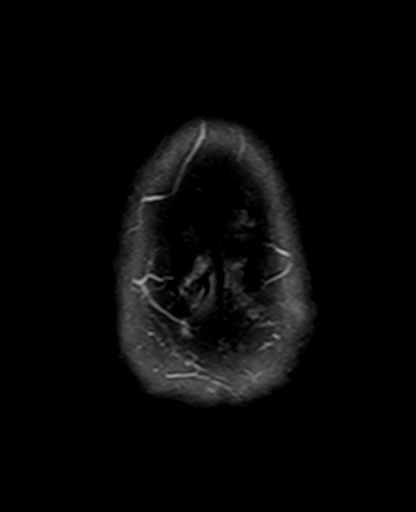

[Series 12: swi_images · axial · 3.0mm · 0.90mm/px · z∈[-86,+87]mm · 5 of 60 slices shown]
[im 1/60]
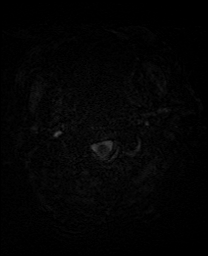
[im 15/60]
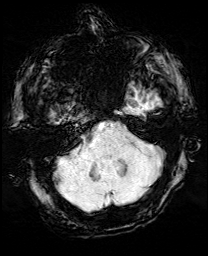
[im 30/60]
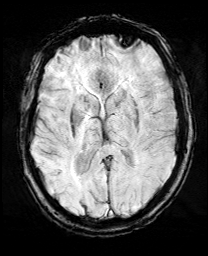
[im 45/60]
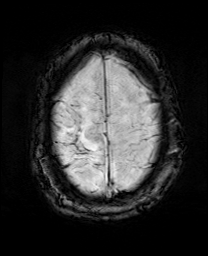
[im 60/60]
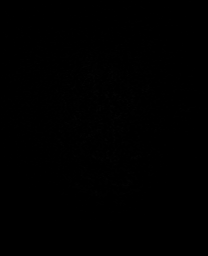

[Series 13: mip_images(sw) · axial · 24.0mm · 0.90mm/px · z∈[-76,+77]mm · 5 of 53 slices shown]
[im 1/53]
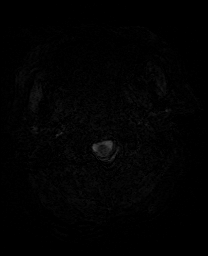
[im 14/53]
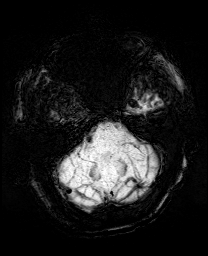
[im 27/53]
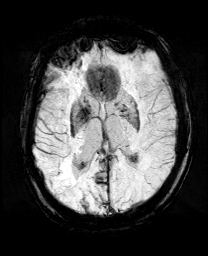
[im 40/53]
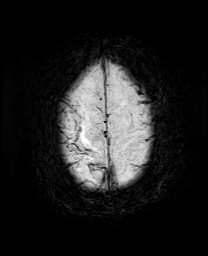
[im 53/53]
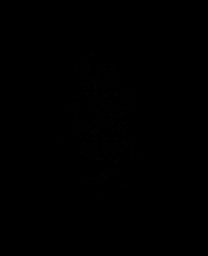

[Series 16: T2 · coronal · 5.0mm · 0.72mm/px · 3 of 28 slices shown (2 of 2)]
[im 1/28]
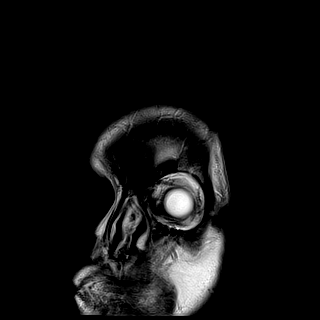
[im 14/28]
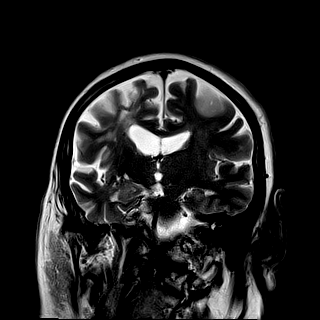
[im 28/28]
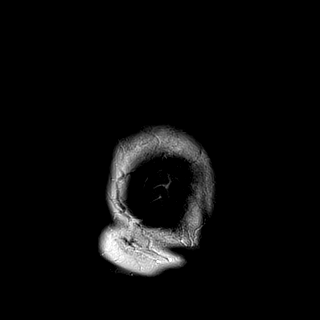

[43 of 48 positions shown; findings below may reference images not displayed]

FINDINGS: Mild motion degraded examination.

INTRACRANIAL CONTENTS: Patchy reduced diffusion RIGHT frontal lobe
towards the convexity with low ADC values. RIGHT frontoparietal
encephalomalacia. Scattered chronic microhemorrhages and RIGHT
superficial siderosis. Mild ex vacuo dilatation RIGHT lateral
ventricle. Mild parenchymal brain volume loss. No hydrocephalus.
Linear T2 bright signal RIGHT cerebral peduncle seen with wallerian
degeneration. Patchy supratentorial and pontine white matter FLAIR
T2 hyperintensities. No midline shift, mass effect or masses.

VASCULAR: Similar loss of RIGHT internal carotid artery flow void to
level of the carotid terminus.

SKULL AND UPPER CERVICAL SPINE: No abnormal sellar expansion. No
suspicious calvarial bone marrow signal. Craniocervical junction
maintained.

SINUSES/ORBITS: Minimal fluid signal RIGHT petrous apex. Chronic
LEFT maxillary sinusitis with atresia. Mild paranasal sinus mucosal
thickening. The included ocular globes and orbital contents are
non-suspicious. Status post bilateral ocular lens implants.

OTHER: Patient is edentulous.
IMPRESSION: 1. Mild motion degraded examination. Multifocal acute small RIGHT
frontal/MCA territory infarcts.
2. Old RIGHT MCA territory infarct.  Chronically occluded RIGHT ICA.
3. Similar parenchymal brain volume loss and mild-to-moderate
chronic small vessel ischemic changes.
4. Stable chronic microhemorrhages and RIGHT superficial siderosis.

## 2019-10-18 IMAGING — XA IR ANGIO INTRA EXTRACRAN SEL COM CAROTID INNOMINATE BILAT MOD SE
1 series · 11 of 24 positions shown · IV contrast (IODINE)
Comparison: CT of the head and neck of 01/12/2018.

CLINICAL DATA: Right cerebral hemispheric watershed infarcts.
Left-sided weakness. Occluded right internal carotid artery stent.

EXAM:
IR ULTRASOUND GUIDANCE VASC ACCESS RIGHT; BILATERAL COMMON CAROTID
AND INNOMINATE ANGIOGRAPHY; IR ANGIO VERTEBRAL SEL VERTEBRAL BILAT
MOD SED
TECHNIQUE: Informed written consent was obtained from the patient after a
thorough discussion of the procedural risks, benefits and
alternatives. All questions were addressed. Maximal Sterile Barrier
Technique was utilized including caps, mask, sterile gowns, sterile
gloves, sterile drape, hand hygiene and skin antiseptic. A timeout
was performed prior to the initiation of the procedure.

[Series 300: dr. (person_name) · 11 of 168 slices shown]
[im 8/168]
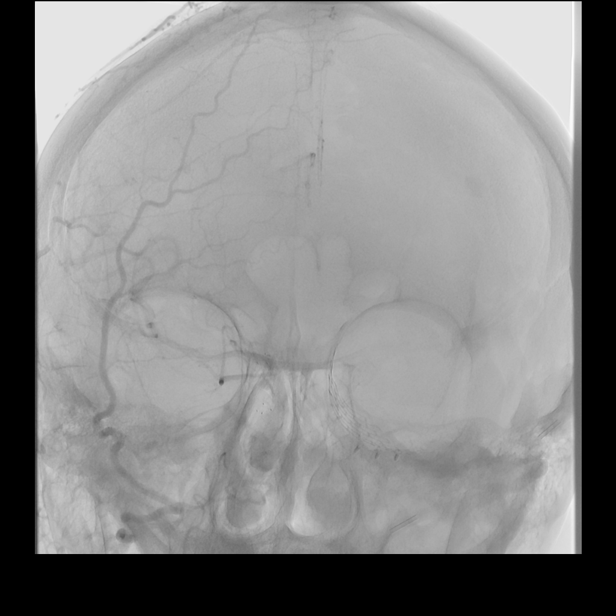
[im 22/168]
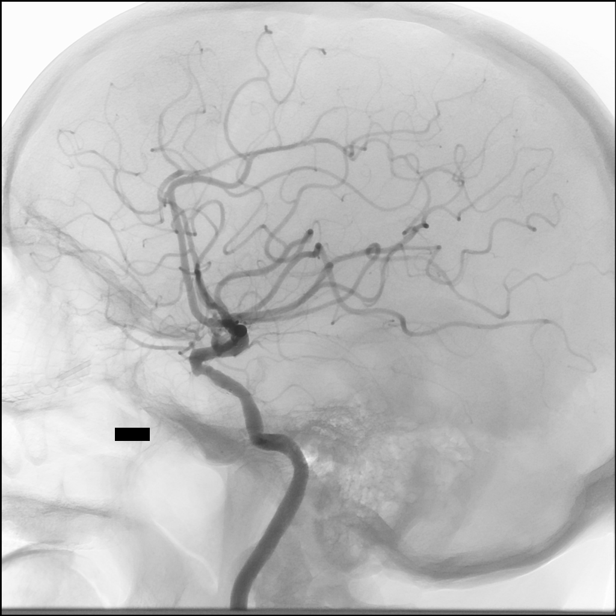
[im 37/168]
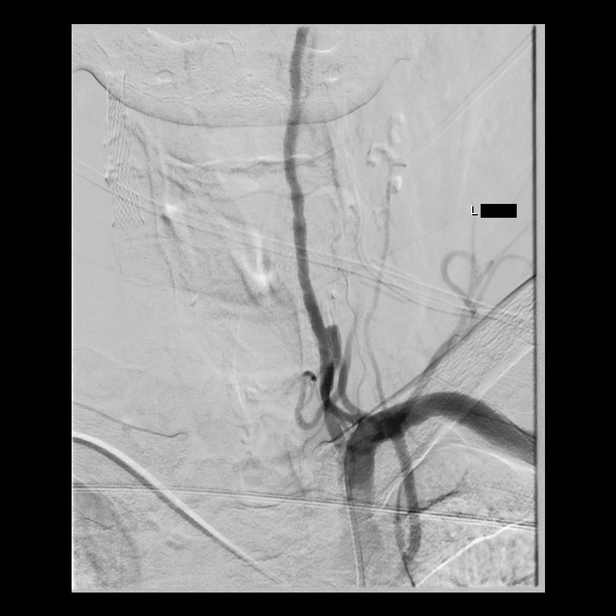
[im 51/168]
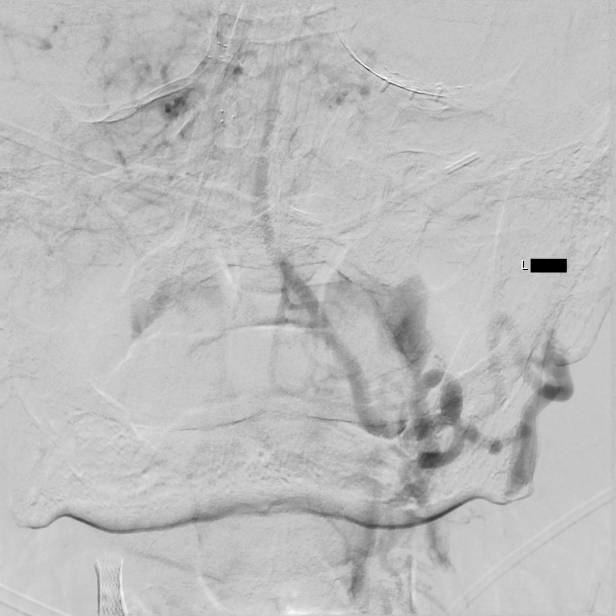
[im 66/168]
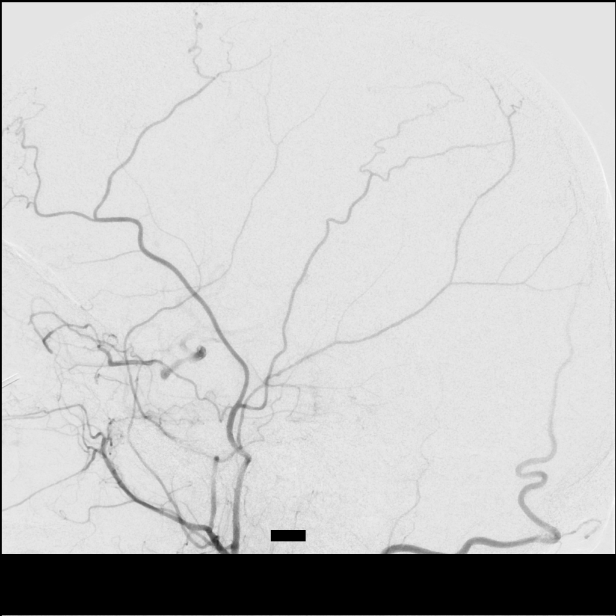
[im 88/168]
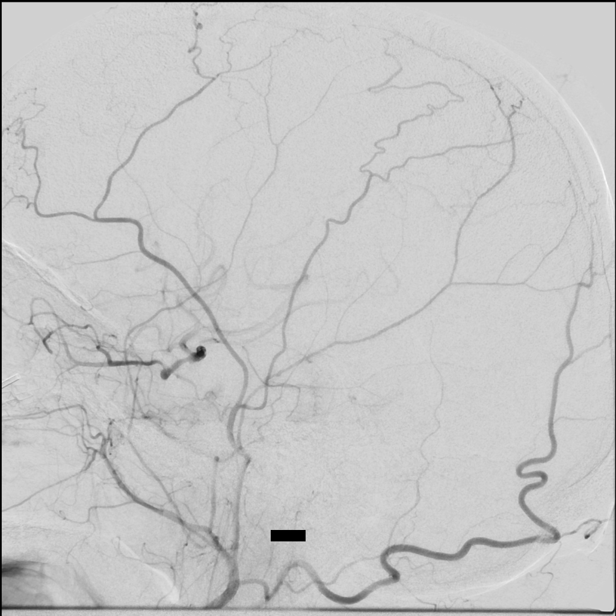
[im 102/168]
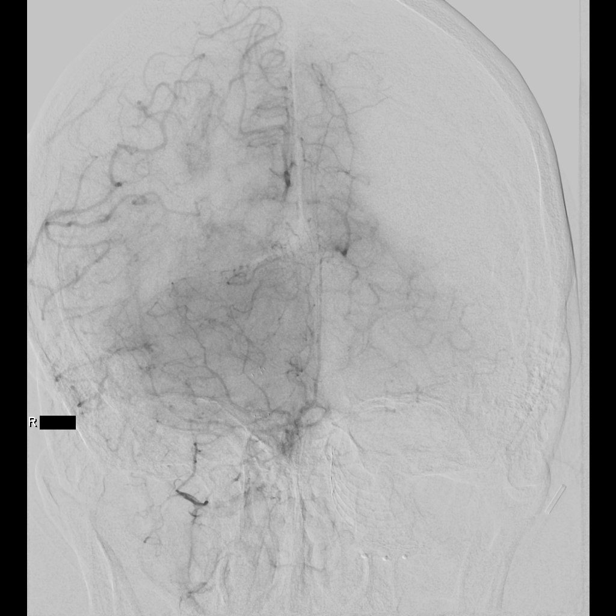
[im 117/168]
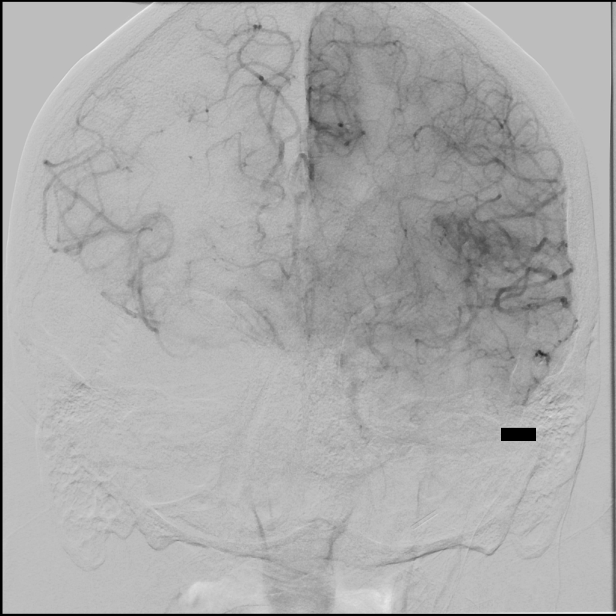
[im 131/168]
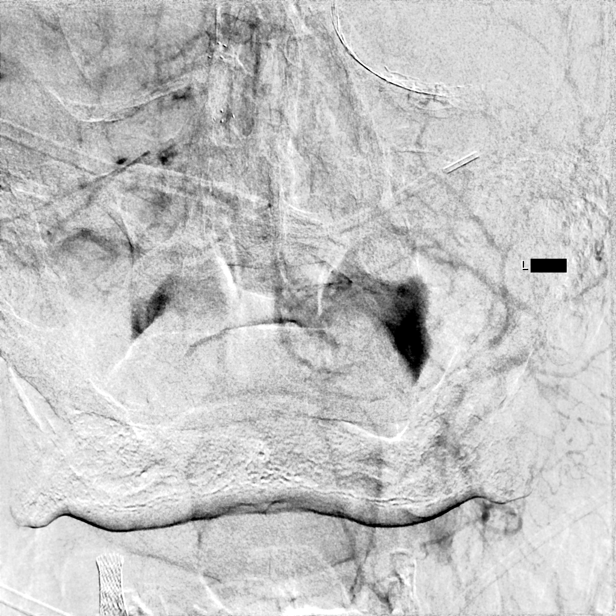
[im 146/168]
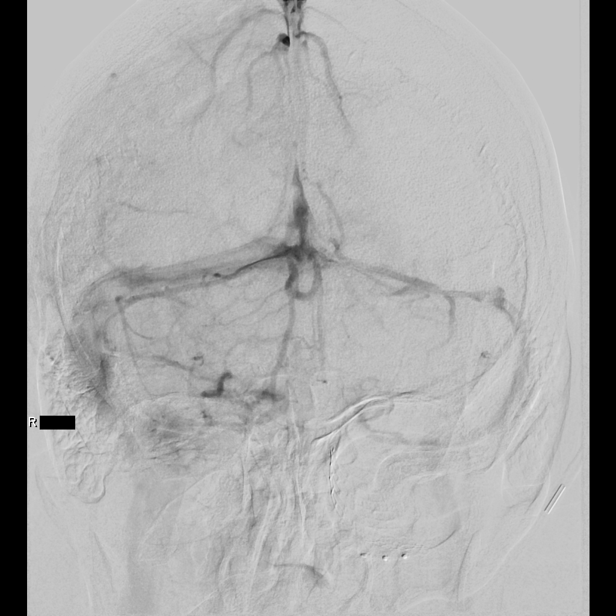
[im 160/168]
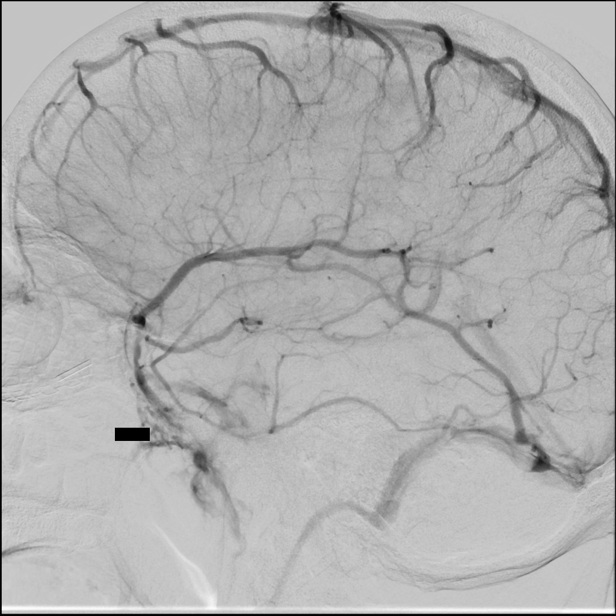

[11 of 24 positions shown; findings below may reference images not displayed]

MEDICATIONS:
Heparin 4666 units IV; no antibiotic was administered within 1 hour
of the procedure.

ANESTHESIA/SEDATION:
Versed 1 mg IV; Fentanyl 25 mcg IV

Moderate Sedation Time:  36 minutes

The patient was continuously monitored during the procedure by the
interventional radiology nurse under my direct supervision.

CONTRAST:  Isovue 300 approximately 55 mL

FLUOROSCOPY TIME:  Fluoroscopy Time: 12 minutes 54 seconds (916
mGy).

COMPLICATIONS:
None immediate.
The right forearm to the wrist was prepped and draped in the usual
sterile fashion. Thereafter, the radial artery was defined by
ultrasound. The [REDACTED] test confirmed dorsal palmar anastomosis.
Thereafter using ultrasound guidance, a transradial access into the
right radial artery was obtained with an 18 gauge pediatric needle.
This was then followed by the advancement of an 0.018 inch guidewire
over which a [DATE] French radial sheath was advanced.

The wire was removed. The sheath was then connected to continuous
heparinized saline infusion.

A mixture of 4666 units of heparin, 2.5 mg of verapamil, and 200 mcg
of nitroglycerin were then injected slowly in a diluted form without
any difficulty.

An angiogram was then performed through the radial sheath which
confirmed patency of the radial artery into the brachial artery.

Over a 0.035 inch Roadrunner guidewire, a 5 Gureych Zadelaar 2
diagnostic catheter was then advanced to the distal subclavian
artery. The guidewire was removed. Good aspiration obtained from the
hub of the diagnostic catheter. An arteriogram was then performed to
verify patency of the right vertebral artery.

Selective cannulation was then performed after the Khristian catheter
had been performed in the aortic arch region. Selective cannulation
was performed of the left common carotid artery, the left vertebral
artery, the right common carotid artery and the right vertebral
artery.

Following the procedure, the sheath was removed and a wrist band was
then applied successfully. Distal radial pulses were palpable at the
end of the procedure.
FINDINGS: The right vertebral artery origin is widely patent.

The vessel is seen to opacify to the cranial skull base. Wide
patency is seen of the right vertebrobasilar junction and the right
posterior-inferior cerebellar artery.

There is mild atherosclerotic irregularity involving the right
vertebrobasilar junction proximal to the right posterior-inferior
cerebellar artery.

The basilar artery, the posterior cerebral arteries, the superior
cerebellar arteries and the anterior-inferior cerebellar arteries is
grossly normal into the capillary and venous phases. Unopacified
blood is seen and in the basilar artery from the contralateral
vertebral artery.

Also demonstrated is retrograde opacification of the right posterior
communicating artery of the right internal carotid artery
supraclinoid segment with subsequent opacification of the right MCA
distribution. The left common carotid arteriogram demonstrates mild
atherosclerotic irregularity of the distal left common carotid
artery proximal to the left internal carotid artery bulb.

There is nonvisualization of the left external carotid artery
circulation.

More distally, the left internal carotid artery is seen to opacify
to the cranial skull base.

There is mild narrowing of the proximal cavernous segment of the
left internal carotid artery. The distal cavernous, and supraclinoid
segments are widely patent.

The left middle cerebral artery and the left anterior cerebral
artery opacify into the capillary and venous phases. There is
cross-filling via the anterior communicating artery of the right
anterior cerebral artery A2 segment and distally, and also the right
A1 segment and the right middle cerebral artery distribution mixing
with unopacified blood from the posterior circulation as described
is noted in the right MCA distribution.

The left vertebral artery origin is widely patent.

The vessel is seen to opacify to the cranial skull base. Mild
caliber irregularity is seen of the mid cervical segment of the left
vertebral artery.

More distally, wide patency is seen the left posterior-inferior
cerebellar artery and the left vertebrobasilar junction.

Opacified portion of the basilar artery, the right posterior
cerebral arteries, the superior cerebellar arteries and the
anterior-inferior cerebellar arteries is noted into the capillary
and venous phases. Unopacified blood is seen in the basilar artery
from the contralateral vertebral artery.

Also demonstrated is a prominent muscular branch of the distal left
vertebral artery which retrogradely opacifies the occipital artery
and subsequently the left external carotid artery branches.

However, there is no opacification of the left external carotid
artery origin.

The right common carotid arteriogram demonstrates the right external
carotid artery and its major branches to be widely patent.

The right internal carotid artery and the proximal portion of the
stent is completely occluded.

The delayed images demonstrate no evidence of a string sign.
However, there is partial reconstitution of the right internal
carotid artery supraclinoid segment via the right ophthalmic artery
with collaterals from the nasolacrimal branches. Faint opacification
of the right middle cerebral artery distribution is noted.
IMPRESSION: Angiographically complete occlusion of the right internal carotid
artery within the previously positioned stent without evidence of a
delayed string sign.

Partial reconstitution of the right internal carotid artery
supraclinoid segment from the ophthalmic artery via the nasolacrimal
collaterals.

Retrograde opacification of the right internal carotid artery
supraclinoid segment from the posterior circulation via the
posterior communicating artery with subsequently into the right
middle cerebral artery.

Partially collateralization of the right MCA distribution and the
right anterior cerebral artery distribution via the anterior
communicating artery from the left internal carotid artery.

Angiographically occluded left external carotid artery at its
origin, with reconstitution via the left occipital artery from the
left vertebral artery.

PLAN:
Angiographic findings were reviewed with the patient.

## 2019-10-20 DIAGNOSIS — G4701 Insomnia due to medical condition: Secondary | ICD-10-CM | POA: Diagnosis not present

## 2019-10-27 DIAGNOSIS — J449 Chronic obstructive pulmonary disease, unspecified: Secondary | ICD-10-CM | POA: Diagnosis not present

## 2019-10-27 DIAGNOSIS — I517 Cardiomegaly: Secondary | ICD-10-CM | POA: Diagnosis not present

## 2019-10-27 DIAGNOSIS — L22 Diaper dermatitis: Secondary | ICD-10-CM | POA: Diagnosis not present

## 2019-10-27 DIAGNOSIS — L209 Atopic dermatitis, unspecified: Secondary | ICD-10-CM | POA: Diagnosis not present

## 2019-10-27 DIAGNOSIS — R0989 Other specified symptoms and signs involving the circulatory and respiratory systems: Secondary | ICD-10-CM | POA: Diagnosis not present

## 2019-11-03 DIAGNOSIS — Z5181 Encounter for therapeutic drug level monitoring: Secondary | ICD-10-CM | POA: Diagnosis not present

## 2019-11-03 DIAGNOSIS — I10 Essential (primary) hypertension: Secondary | ICD-10-CM | POA: Diagnosis not present

## 2019-11-03 DIAGNOSIS — R799 Abnormal finding of blood chemistry, unspecified: Secondary | ICD-10-CM | POA: Diagnosis not present

## 2019-11-03 DIAGNOSIS — I509 Heart failure, unspecified: Secondary | ICD-10-CM | POA: Diagnosis not present

## 2019-11-03 DIAGNOSIS — D649 Anemia, unspecified: Secondary | ICD-10-CM | POA: Diagnosis not present

## 2019-12-08 DIAGNOSIS — G4701 Insomnia due to medical condition: Secondary | ICD-10-CM | POA: Diagnosis not present

## 2019-12-27 IMAGING — MR MR HEAD W/O CM
7 series · 48 of 48 positions shown · non-contrast
Comparison: Head CT and CTA 03/25/2018

CLINICAL DATA: Left arm weakness and facial droop.

EXAM:
MRI HEAD WITHOUT CONTRAST
TECHNIQUE: Multiplanar, multiecho pulse sequences of the brain and surrounding
structures were obtained without intravenous contrast.

[Series 5: DWI · axial · 3.0mm · 0.88mm/px · z∈[-59,+88]mm · 14 of 100 slices shown (1 of 4)]
[im 1/100]
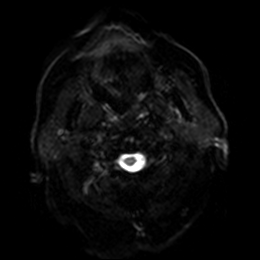
[im 8/100]
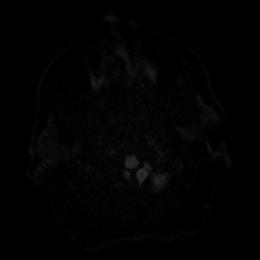
[im 16/100]
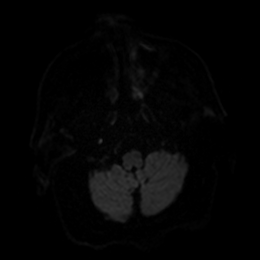
[im 23/100]
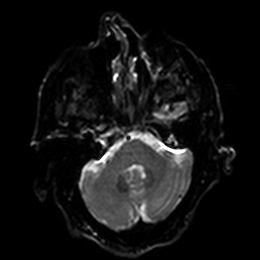
[im 31/100]
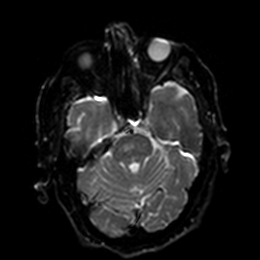
[im 39/100]
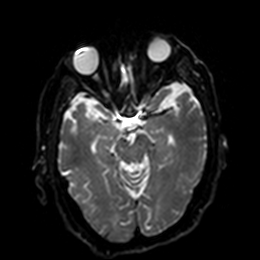
[im 46/100]
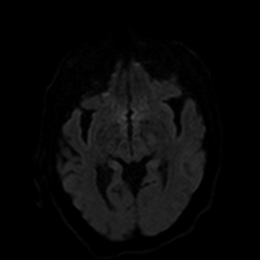
[im 54/100]
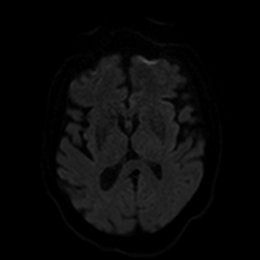
[im 61/100]
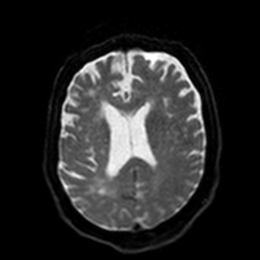
[im 69/100]
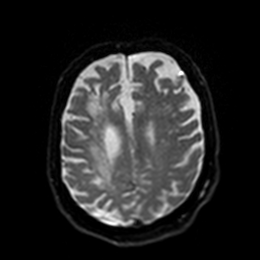
[im 77/100]
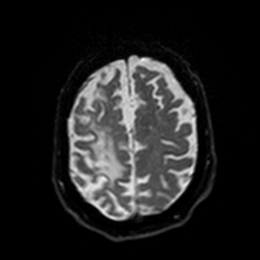
[im 84/100]
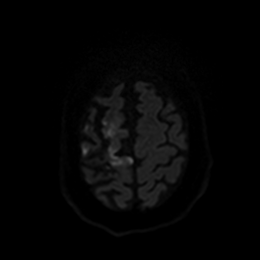
[im 92/100]
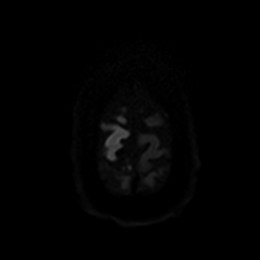
[im 100/100]
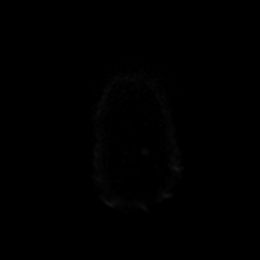

[Series 6: DWI · axial · 3.0mm · 0.88mm/px · z∈[-59,+88]mm · 7 of 50 slices shown (2 of 4)]
[im 1/50]
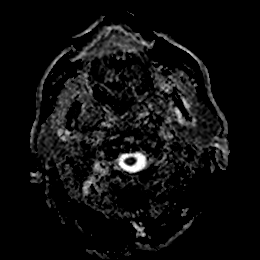
[im 9/50]
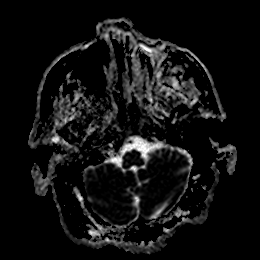
[im 17/50]
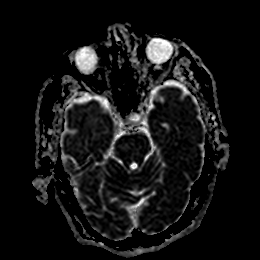
[im 25/50]
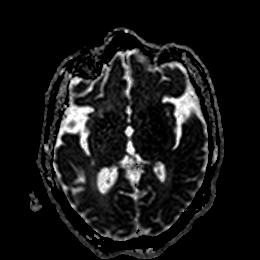
[im 33/50]
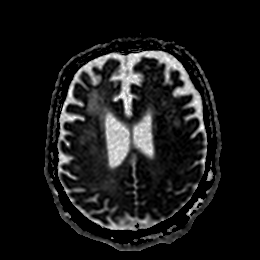
[im 41/50]
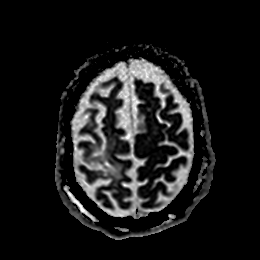
[im 50/50]
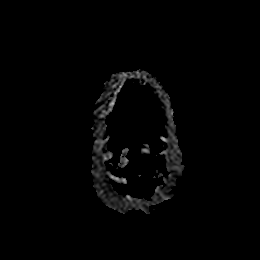

[Series 7: FLAIR · axial · 5.0mm · 0.90mm/px · z∈[-61,+83]mm · 4 of 25 slices shown]
[im 1/25]
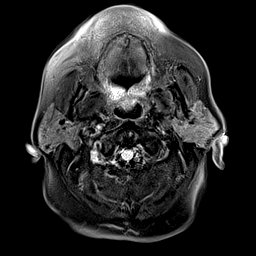
[im 9/25]
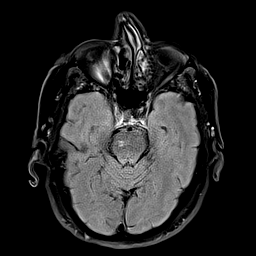
[im 17/25]
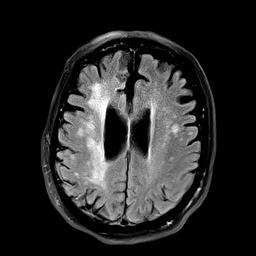
[im 25/25]
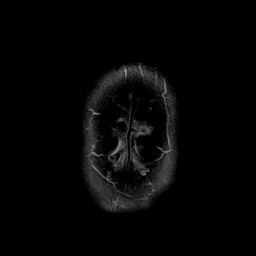

[Series 8: ax hemo · axial · 5.0mm · 0.90mm/px · z∈[-61,+83]mm · 4 of 25 slices shown]
[im 1/25]
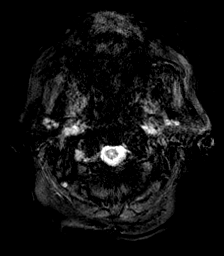
[im 9/25]
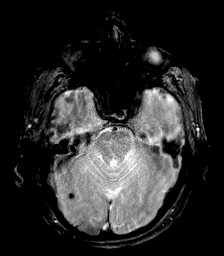
[im 17/25]
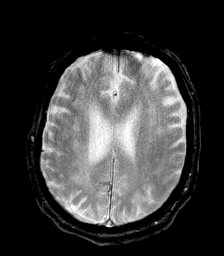
[im 25/25]
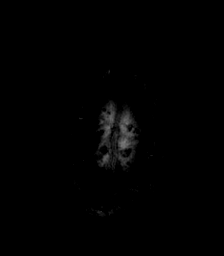

[Series 9: T2 · axial · 5.0mm · 0.72mm/px · z∈[-61,+83]mm · 4 of 25 slices shown]
[im 1/25]
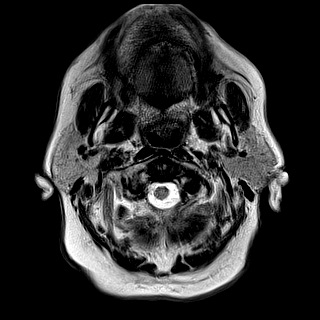
[im 9/25]
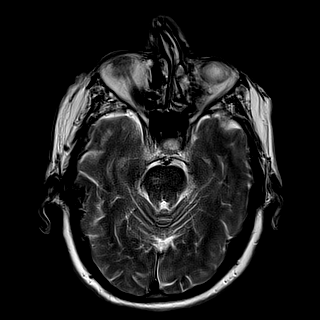
[im 17/25]
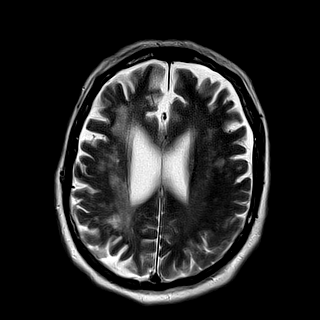
[im 25/25]
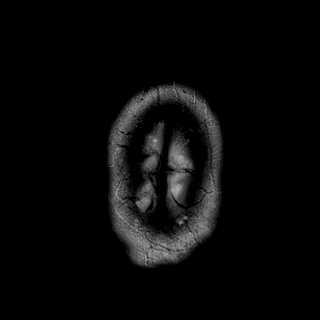

[Series 10: DWI · coronal · 4.0mm · 0.88mm/px · 10 of 68 slices shown (3 of 4)]
[im 1/68]
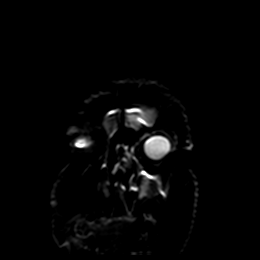
[im 8/68]
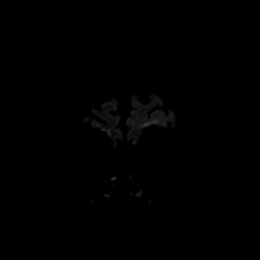
[im 15/68]
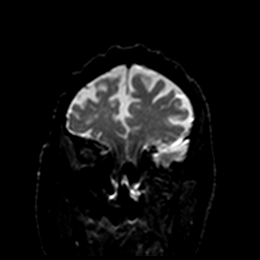
[im 23/68]
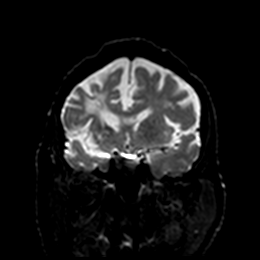
[im 30/68]
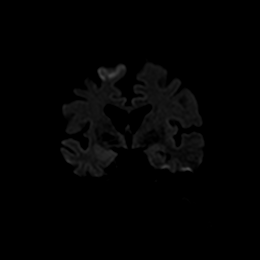
[im 38/68]
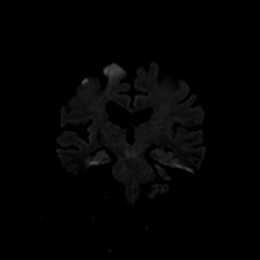
[im 45/68]
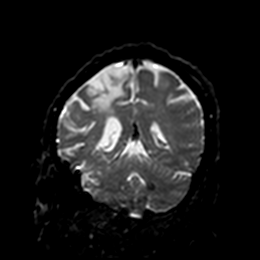
[im 53/68]
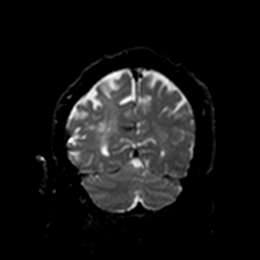
[im 60/68]
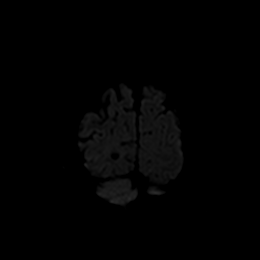
[im 68/68]
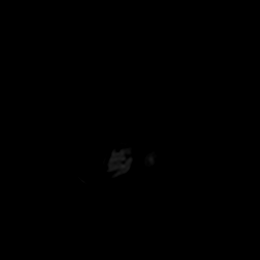

[Series 11: DWI · coronal · 4.0mm · 0.88mm/px · 5 of 34 slices shown (4 of 4)]
[im 1/34]
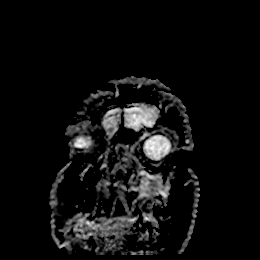
[im 9/34]
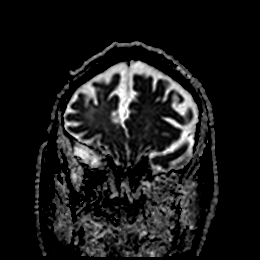
[im 17/34]
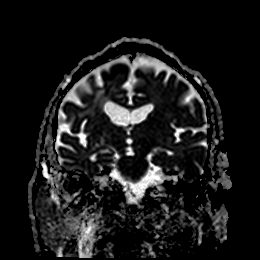
[im 25/34]
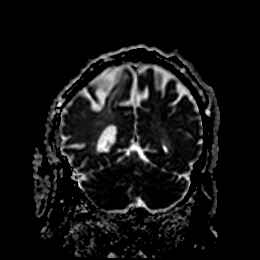
[im 34/34]
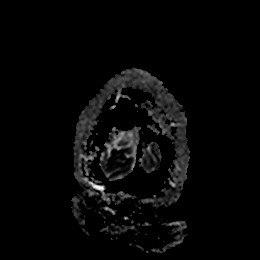

[48 of 48 positions shown; findings below may reference images not displayed]

FINDINGS: BRAIN: There is abnormal diffusion restriction predominantly within
the right anterior cerebral artery territory, with small foci of
diffusion abnormality within the anterior portion of the right
middle cerebral artery territory. The midline structures are normal.
No midline shift or other mass effect. Confluent hyperintense
T2-weighted signal within the right MCA corona radiata. There is an
old posterior right parietal infarct. Multifocal white matter
hyperintensity bilaterally otherwise. There is hyperintense
T2-weighted signal also within the right midbrain and pons. There is
generalized atrophy. Susceptibility-sensitive sequences show no
chronic microhemorrhage or superficial siderosis.

VASCULAR: Loss of the normal right ICA flow void.

SKULL AND UPPER CERVICAL SPINE: Calvarial bone marrow signal is
normal. There is no skull base mass. Visualized upper cervical spine
and soft tissues are normal.

SINUSES/ORBITS: Moderate left maxillary mucosal thickening. There
are bilateral lens replacements.
IMPRESSION: 1. Acute ischemia within the anterior right hemisphere,
predominantly within the right ACA territory, with small foci also
present in the MCA territory.
2. Loss of the normal right ICA flow void, consistent with known
occlusion.
3. No hemorrhage or mass effect.
4. Chronic ischemic microangiopathy and old infarcts.

## 2019-12-28 IMAGING — DX DG ABDOMEN 1V
2 series · 2 of 2 positions shown · non-contrast
Comparison: One-view pelvis 09/19/2017.  Abdominal CT 08/27/2012.

CLINICAL DATA: Recurrent left upper quadrant abdominal pain.

EXAM:
ABDOMEN - 1 VIEW

[abdomen kub (1 of 2)]
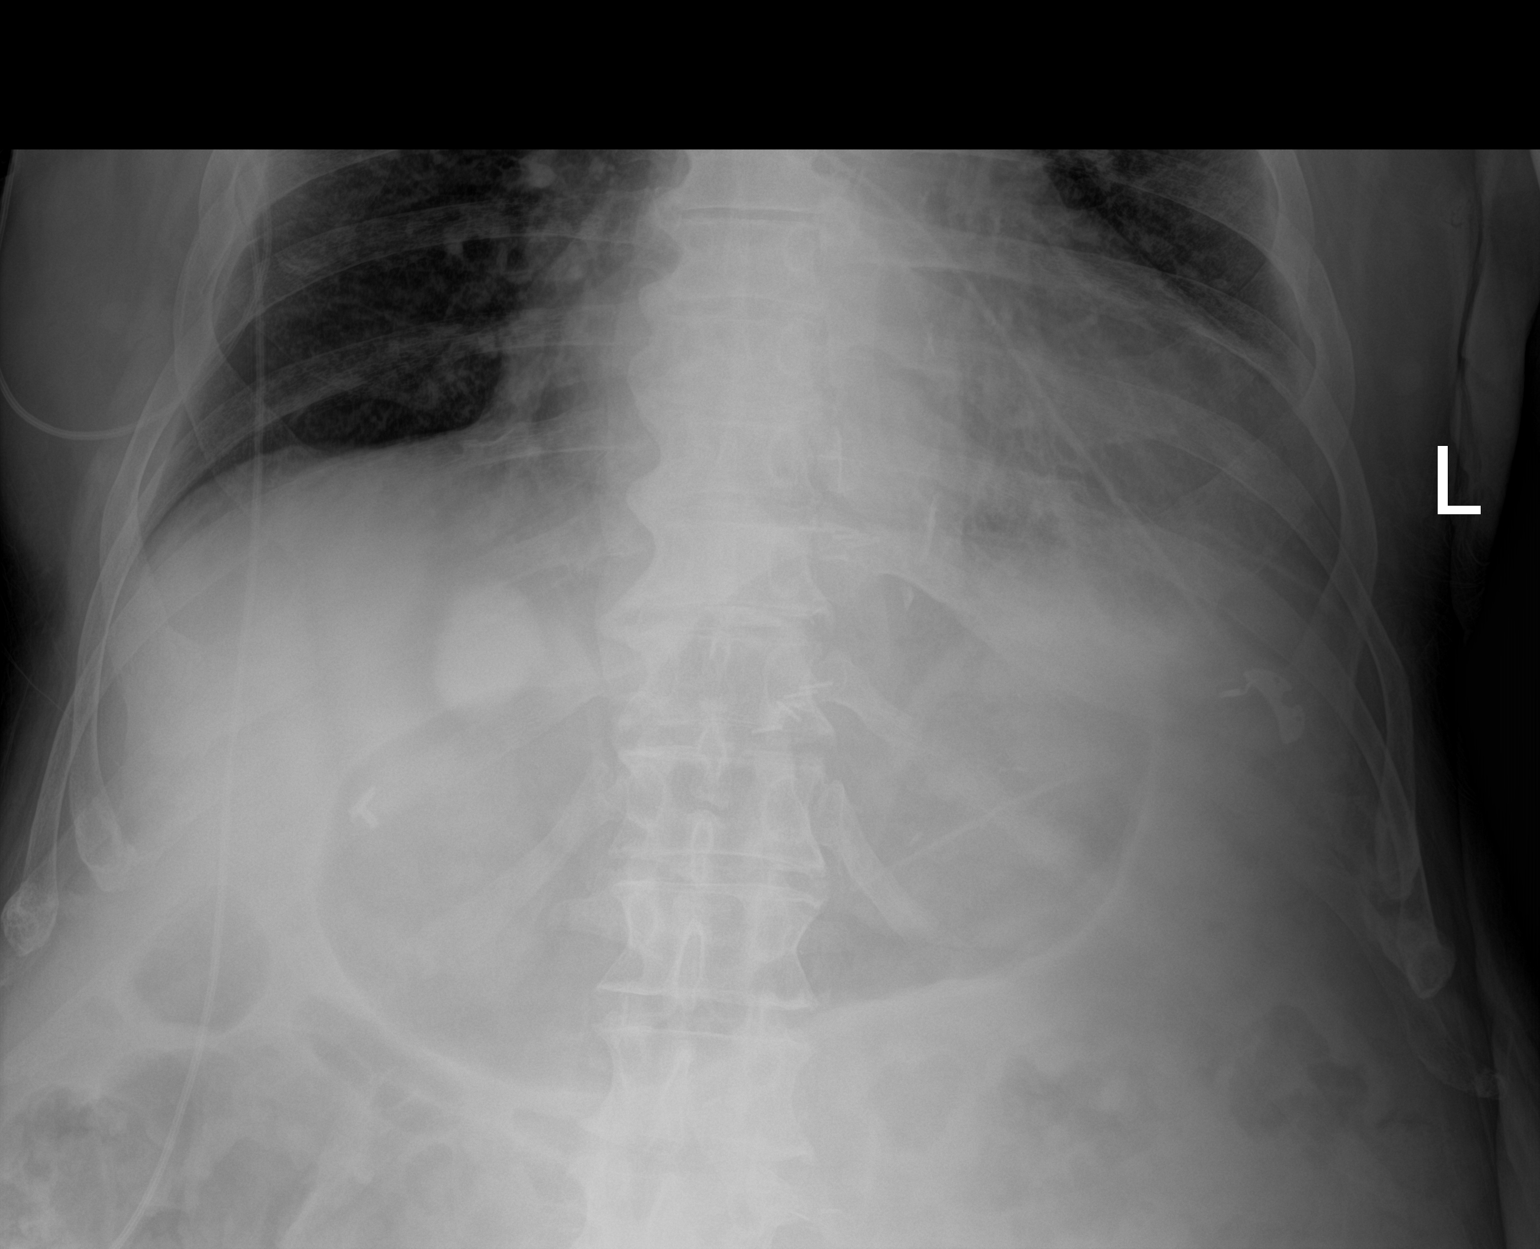

[abdomen kub (2 of 2)]
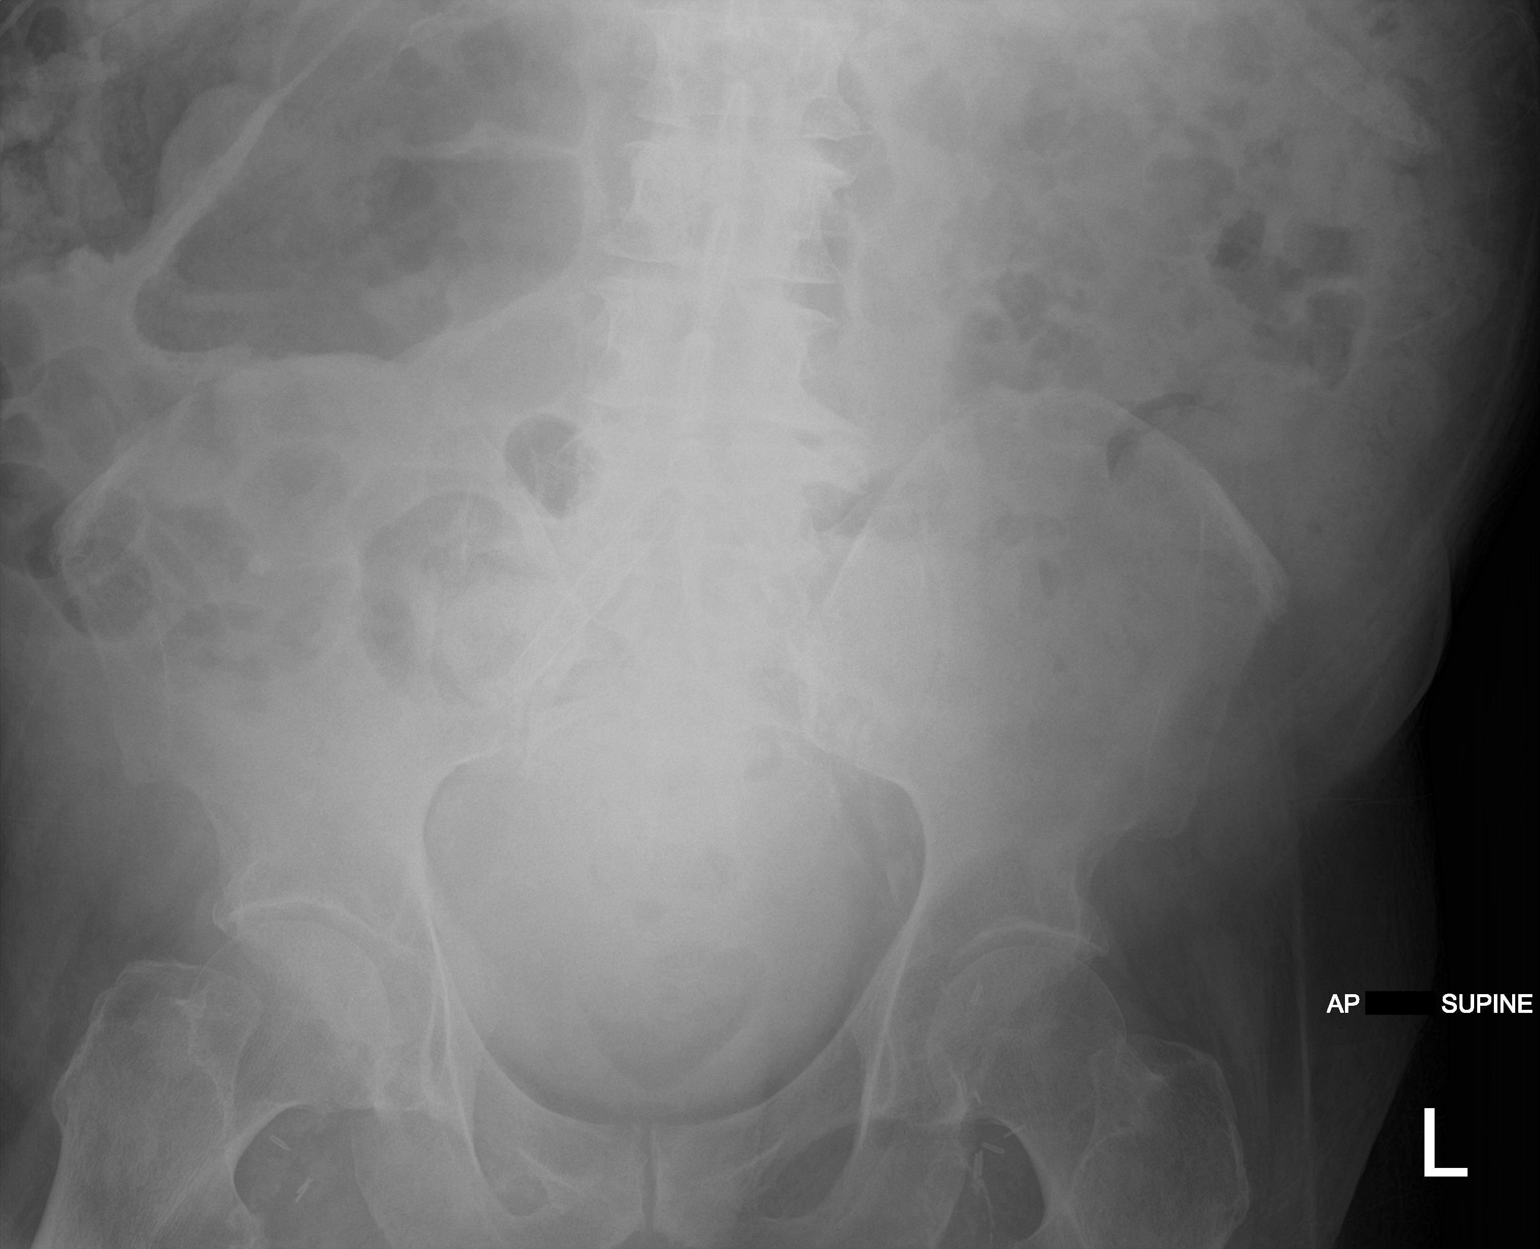

[2 of 2 positions shown; findings below may reference images not displayed]

FINDINGS: There is mild gaseous distention of the stomach. No significant
small bowel or colonic distension or wall thickening identified.
There is no supine evidence of free intraperitoneal air. Surgical
clips are present in the upper abdomen and both groins. Right iliac
stent and diffuse vascular calcifications noted.
IMPRESSION: No radiographic evidence of active abdominal process.

## 2019-12-29 IMAGING — CR DG CHEST 2V
2 series · 2 of 2 positions shown · non-contrast
Comparison: Multiple chest x-rays since May 29, 2016

CLINICAL DATA: Leg and face weakness.

EXAM:
CHEST - 2 VIEW

[chest lat]
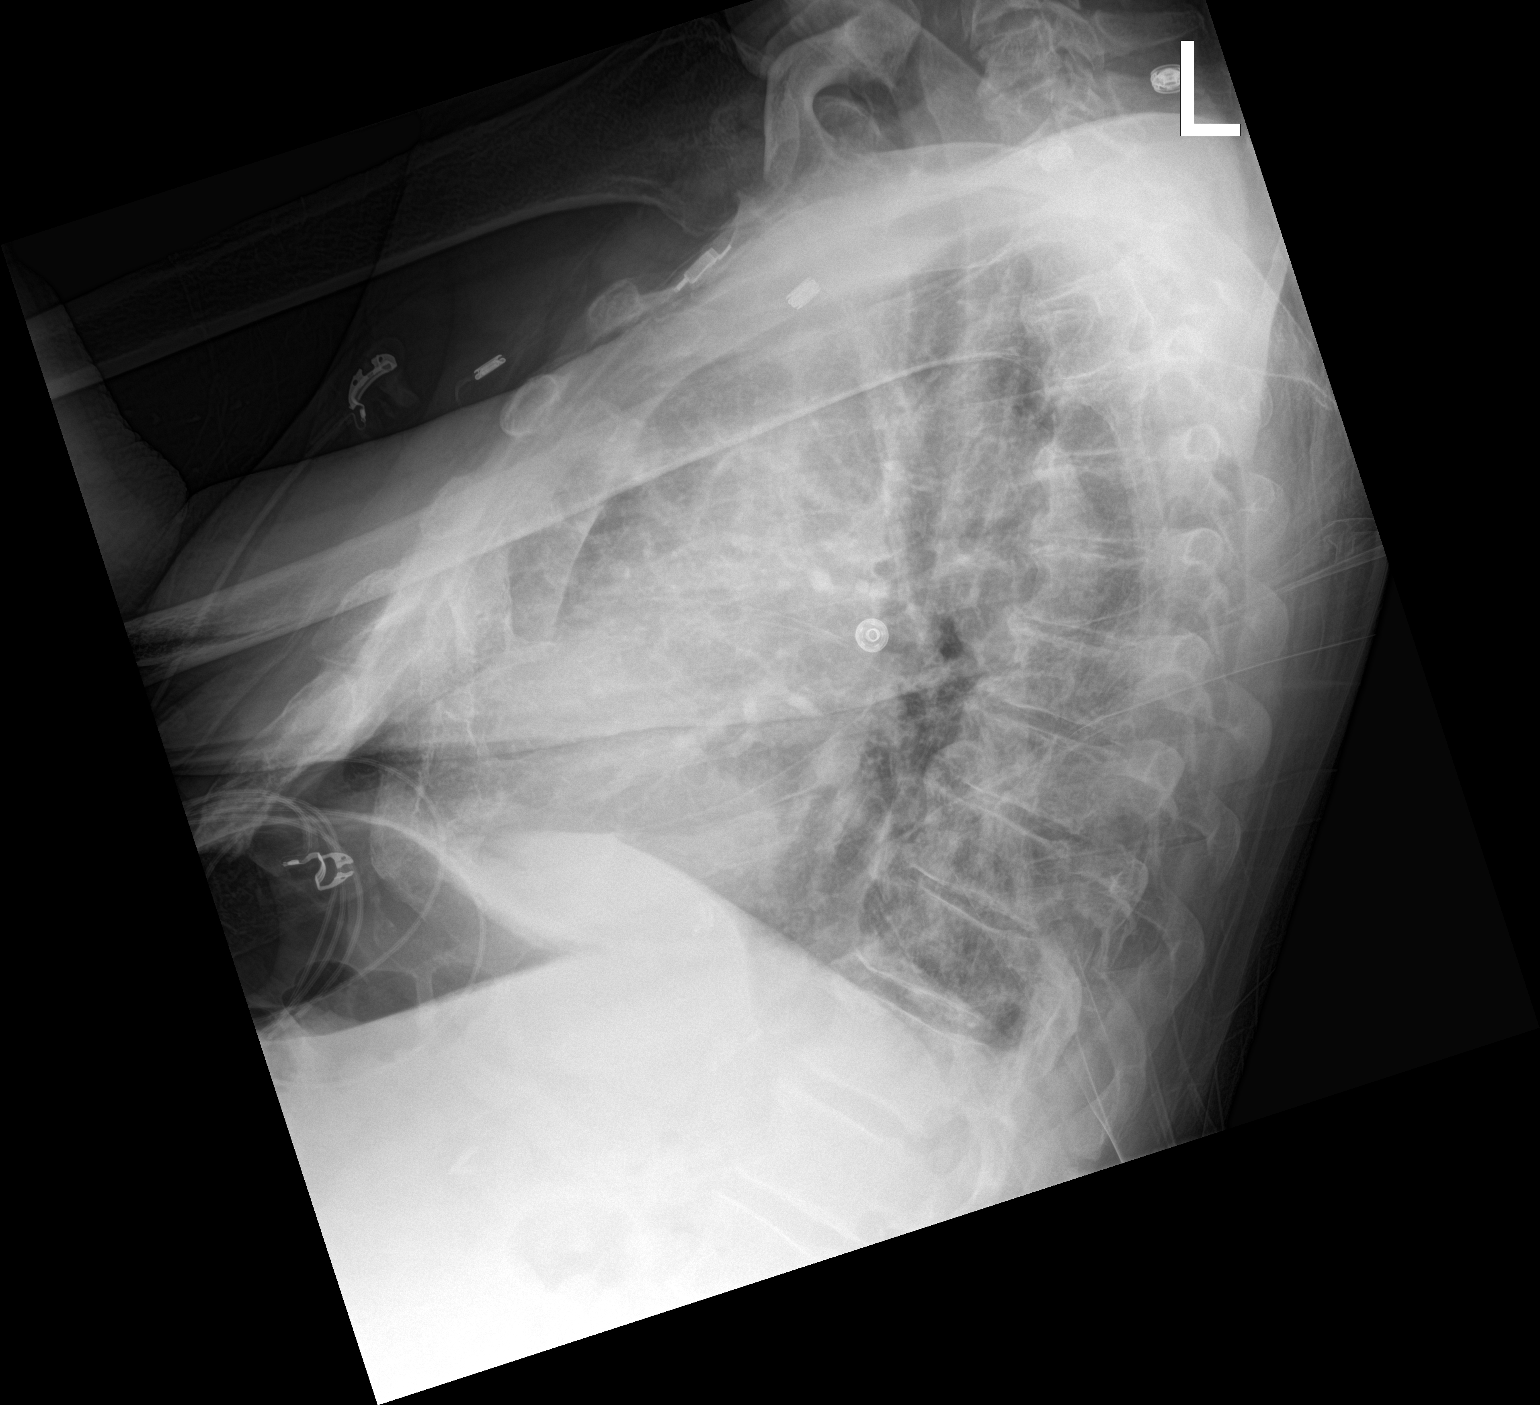

[chest ap]
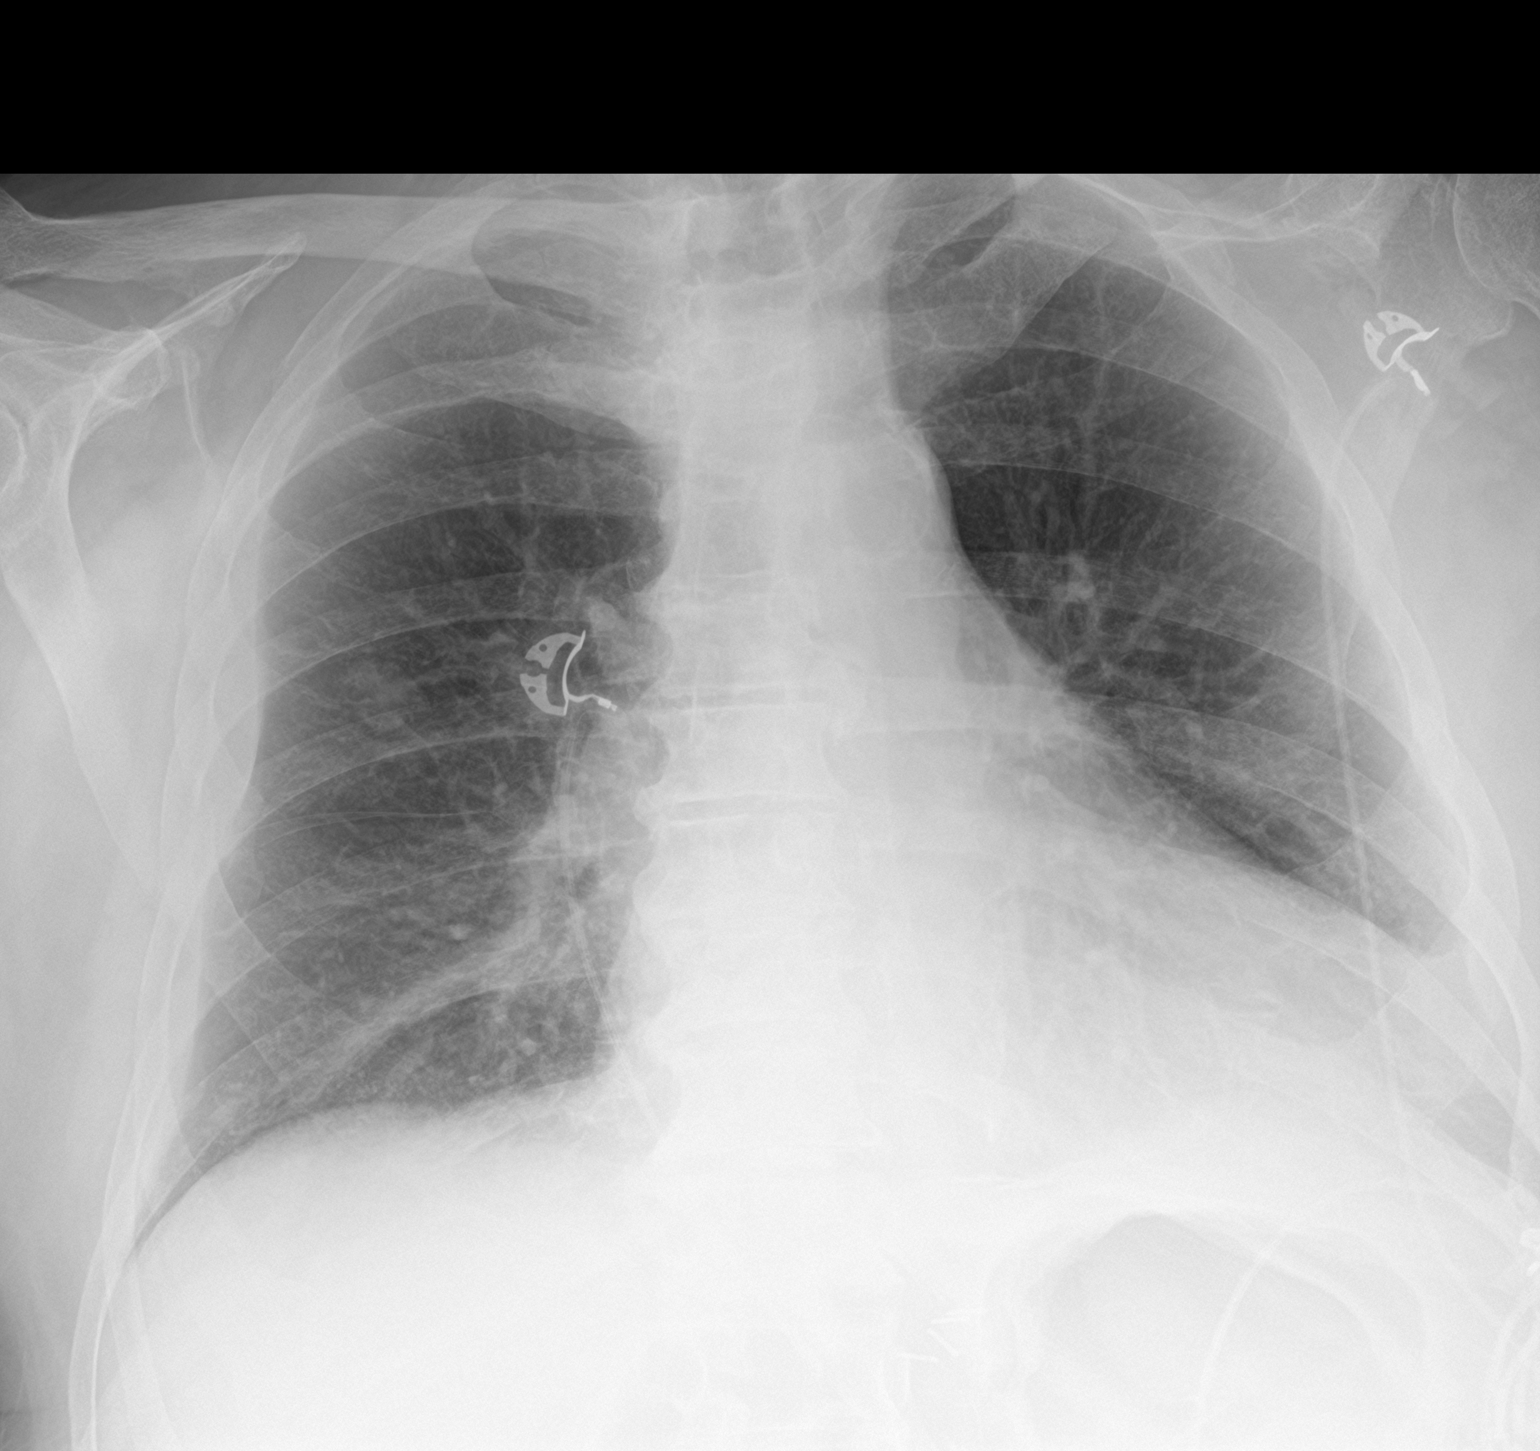

[2 of 2 positions shown; findings below may reference images not displayed]

FINDINGS: Again noted is a nodule in the right mid lung measuring up to 17 mm.
CT is been recommended previously but I see no evidence 1 has been
performed. No other nodules or masses. Probable atelectasis in the
left base. No other interval changes or acute abnormalities.
IMPRESSION: 1. There is a 17 mm nodule in the right mid lung. Recommend a CT
scan for further characterization.
2. Opacity in the left base favored represent atelectasis.
3. No other acute abnormalities.

## 2019-12-30 IMAGING — CT CT CHEST W/ CM
2 of 4 series · 15 of 36 positions shown, 18 images · IV contrast (omnipaque)
Comparison: Chest radiograph, 03/27/2018

CLINICAL DATA: Follow-up lung nodule suspected on chest radiograph

EXAM:
CT CHEST WITH CONTRAST
TECHNIQUE: Multidetector CT imaging of the chest was performed during
intravenous contrast administration.
CONTRAST:  75mL OMNIPAQUE IOHEXOL 300 MG/ML  SOLN

[Series 3: thorax 2.0 i31f 2 · axial · 0.86mm/px · z∈[+874,+1176]mm · 12 of 175 slices shown, 15 images]
[im 12/175  mediastinal]
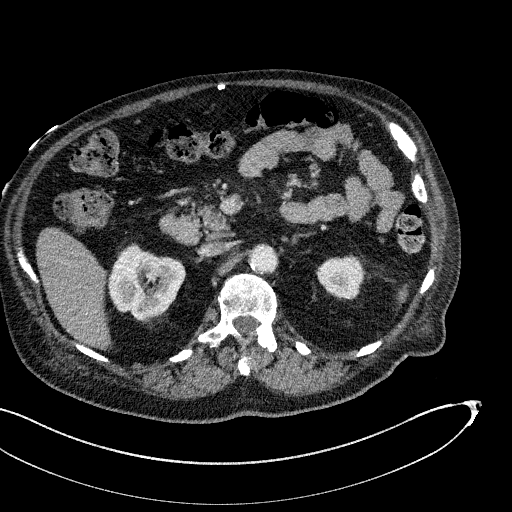
[im 12/175  lung]
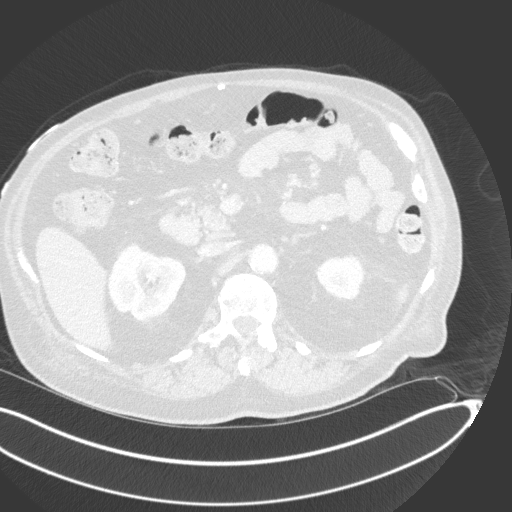
[im 24/175  lung]
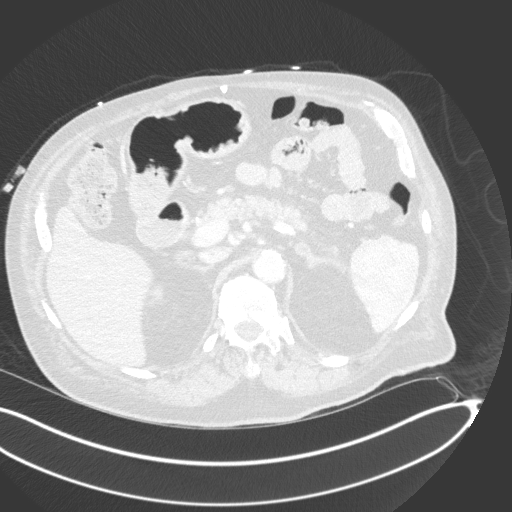
[im 35/175  lung]
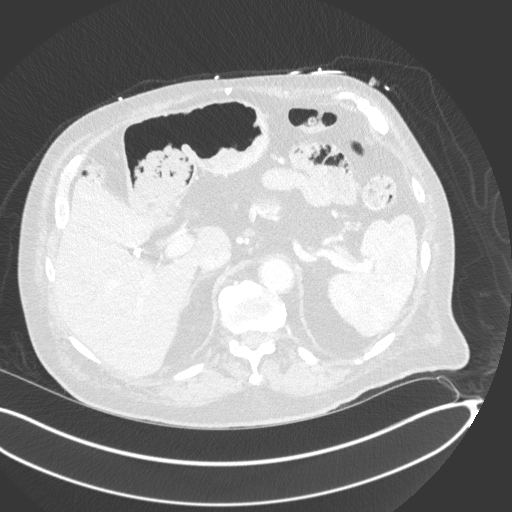
[im 59/175  lung]
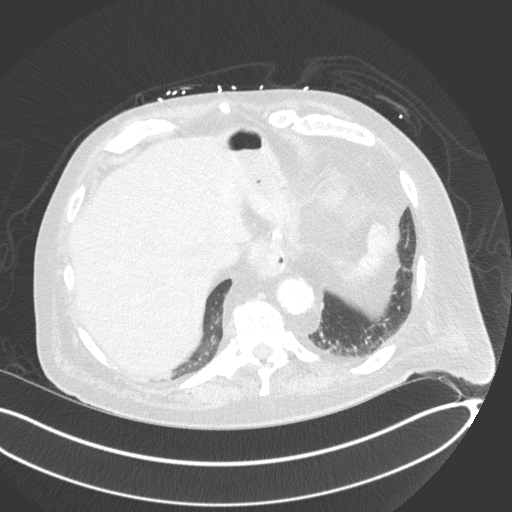
[im 70/175  mediastinal]
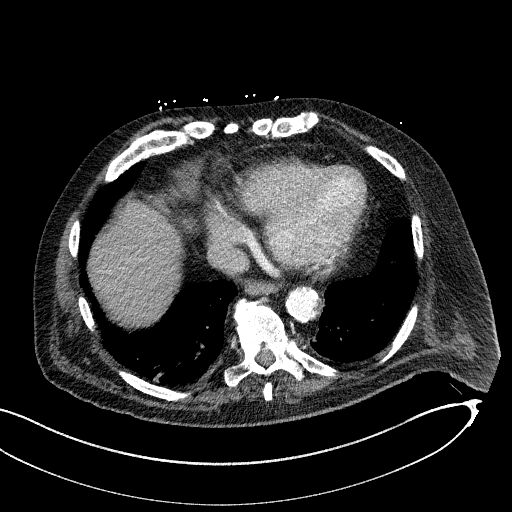
[im 70/175  lung]
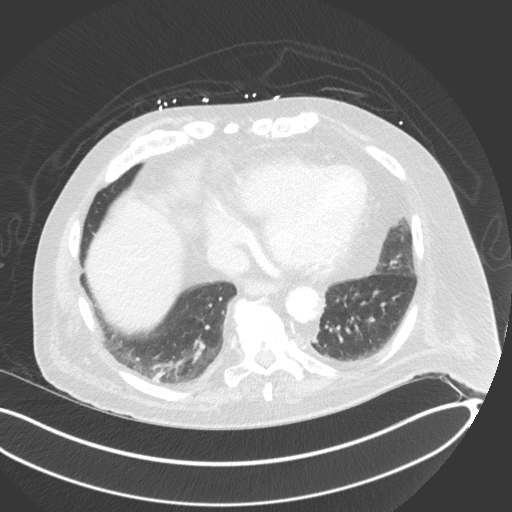
[im 82/175  lung]
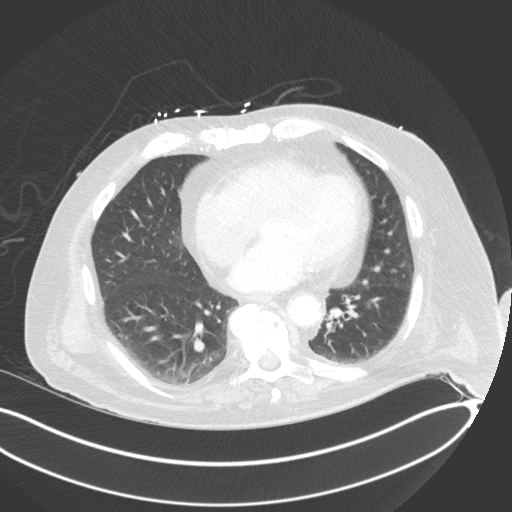
[im 93/175  lung]
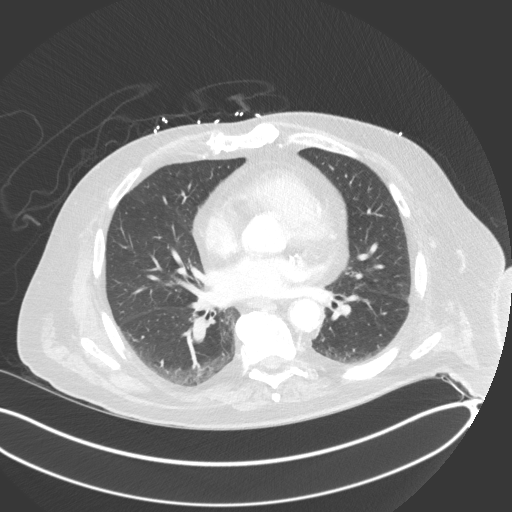
[im 105/175  lung]
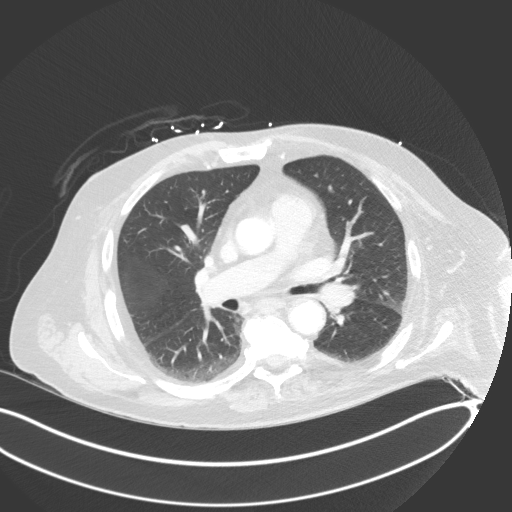
[im 117/175  mediastinal]
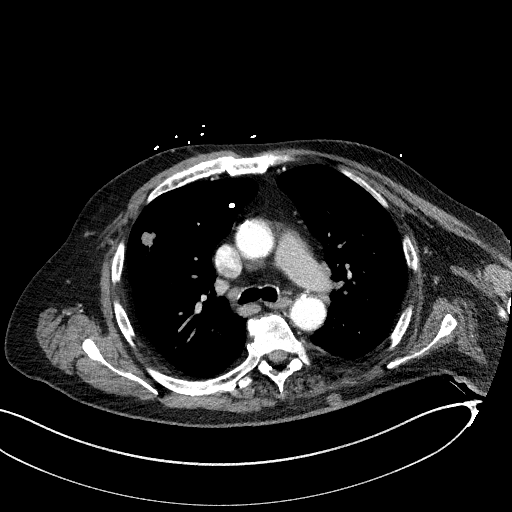
[im 117/175  lung]
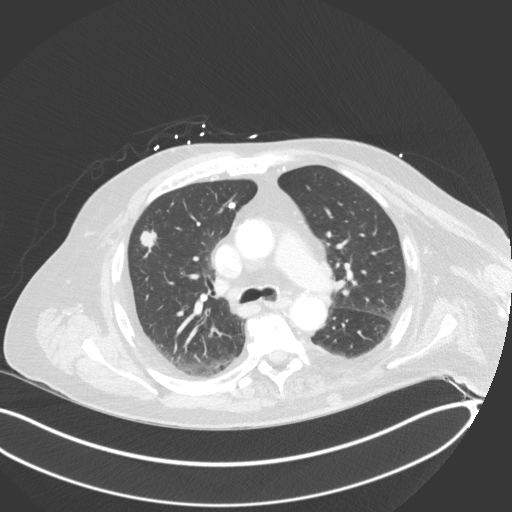
[im 140/175  lung]
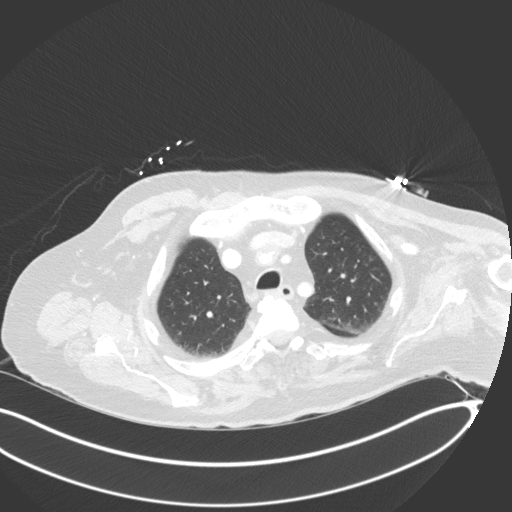
[im 151/175  lung]
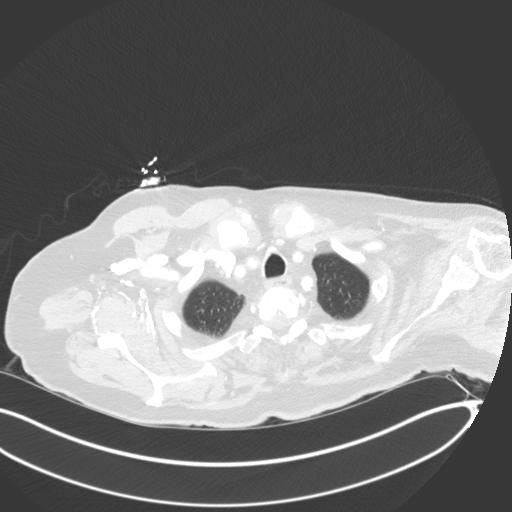
[im 163/175  lung]
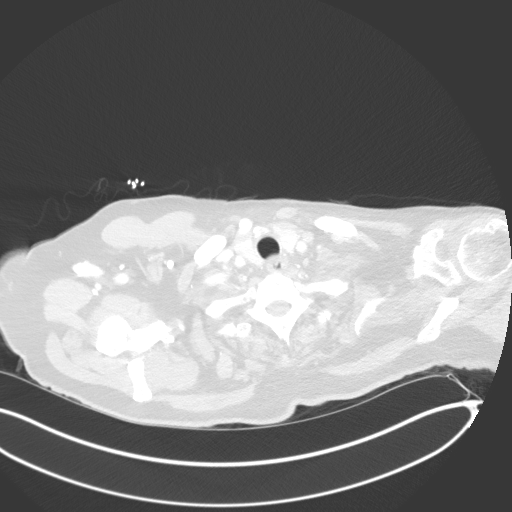

[Series 5: coronal · coronal · 0.70mm/px · 3 of 151 slices shown]
[im 31/151  lung]
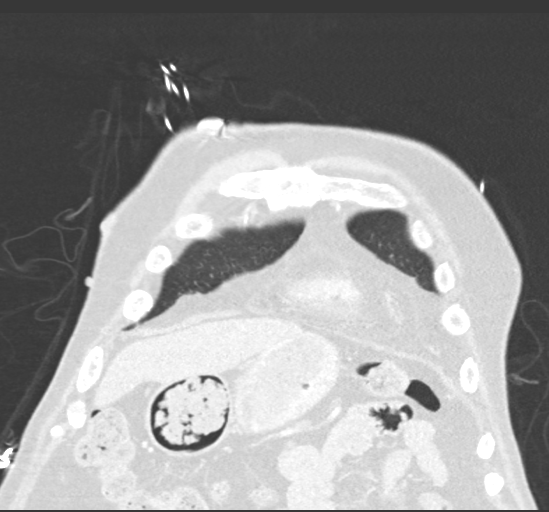
[im 61/151  lung]
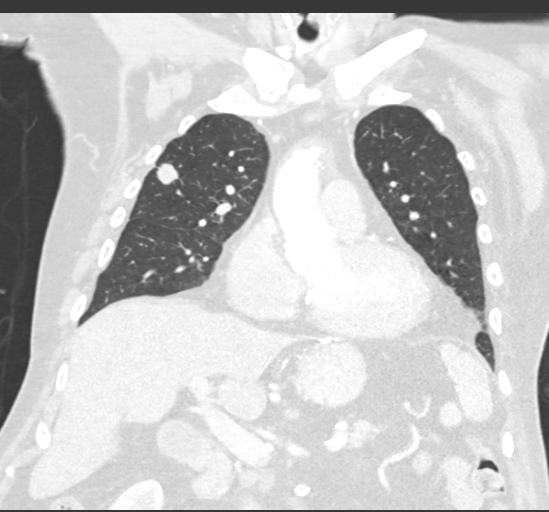
[im 91/151  lung]
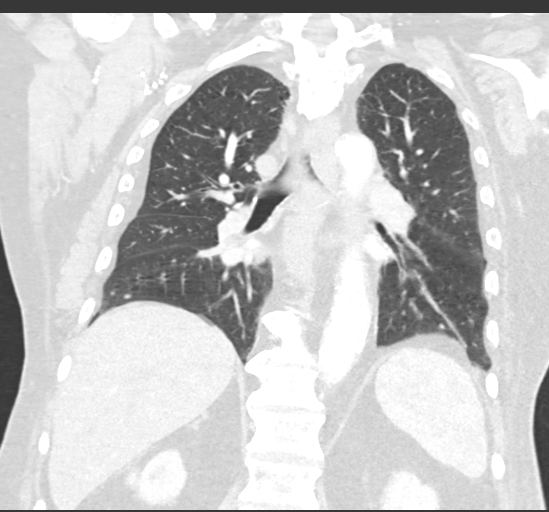

[15 of 36 positions shown; findings below may reference images not displayed]

FINDINGS: Cardiovascular: Cardiomegaly with 3 vessel coronary artery
calcifications. No pericardial effusion. There is severe aortic
atherosclerosis with approximately 50% mixed atherosclerotic
stenosis of the origin of the brachiocephalic artery (series 3,
image 37, series 5, image 73).

Mediastinum/Nodes: No enlarged mediastinal, hilar, or axillary lymph
nodes. Thyroid gland, trachea, and esophagus demonstrate no
significant findings.

Lungs/Pleura: There is a lobulated 2.0 cm pulmonary nodule of the
right upper lobe with subtle adjacent spiculation and architectural
distortion (series 7, image 56). There are additional small
nonspecific pulmonary nodules present elsewhere, for example a 4 mm
nodule of the right middle lobe (series 7, image 86). Bibasilar
atelectasis or scarring. No pleural effusion or pneumothorax.

Upper Abdomen: No acute abnormality.

Musculoskeletal: No chest wall mass or suspicious bone lesions
identified.
IMPRESSION: 1. There is a lobulated 2.0 cm pulmonary nodule of the right upper
lobe with subtle adjacent spiculation and architectural distortion
(series 7, image 56). This nodule is highly suspicious for
malignancy. Given size and location, diagnostic options include
PET-CT for metabolic characterization and percutaneous CT-guided
biopsy. At minimum, recommend follow-up CT in 3 months to ensure
stability.

2.  Coronary artery disease.

3. There is severe aortic atherosclerosis with approximately 50%
mixed atherosclerotic stenosis of the origin of the brachiocephalic
artery (series 3, image 37, series 5, image 73).

## 2020-01-09 DIAGNOSIS — G4701 Insomnia due to medical condition: Secondary | ICD-10-CM | POA: Diagnosis not present

## 2020-01-12 DIAGNOSIS — R053 Chronic cough: Secondary | ICD-10-CM | POA: Diagnosis not present

## 2020-01-12 DIAGNOSIS — Z7409 Other reduced mobility: Secondary | ICD-10-CM | POA: Diagnosis not present

## 2020-01-12 DIAGNOSIS — R0989 Other specified symptoms and signs involving the circulatory and respiratory systems: Secondary | ICD-10-CM | POA: Diagnosis not present

## 2020-01-12 DIAGNOSIS — I6930 Unspecified sequelae of cerebral infarction: Secondary | ICD-10-CM | POA: Diagnosis not present

## 2020-01-12 DIAGNOSIS — I69354 Hemiplegia and hemiparesis following cerebral infarction affecting left non-dominant side: Secondary | ICD-10-CM | POA: Diagnosis not present

## 2020-01-13 DIAGNOSIS — R059 Cough, unspecified: Secondary | ICD-10-CM | POA: Diagnosis not present

## 2020-01-13 DIAGNOSIS — R509 Fever, unspecified: Secondary | ICD-10-CM | POA: Diagnosis not present

## 2020-01-13 DIAGNOSIS — R0989 Other specified symptoms and signs involving the circulatory and respiratory systems: Secondary | ICD-10-CM | POA: Diagnosis not present

## 2020-01-17 DIAGNOSIS — I69952 Hemiplegia and hemiparesis following unspecified cerebrovascular disease affecting left dominant side: Secondary | ICD-10-CM | POA: Diagnosis not present

## 2020-01-17 DIAGNOSIS — G8194 Hemiplegia, unspecified affecting left nondominant side: Secondary | ICD-10-CM | POA: Diagnosis not present

## 2020-01-17 DIAGNOSIS — M6281 Muscle weakness (generalized): Secondary | ICD-10-CM | POA: Diagnosis not present

## 2020-01-18 DIAGNOSIS — G8194 Hemiplegia, unspecified affecting left nondominant side: Secondary | ICD-10-CM | POA: Diagnosis not present

## 2020-01-18 DIAGNOSIS — M6281 Muscle weakness (generalized): Secondary | ICD-10-CM | POA: Diagnosis not present

## 2020-01-18 DIAGNOSIS — I69952 Hemiplegia and hemiparesis following unspecified cerebrovascular disease affecting left dominant side: Secondary | ICD-10-CM | POA: Diagnosis not present

## 2020-01-19 DIAGNOSIS — M6281 Muscle weakness (generalized): Secondary | ICD-10-CM | POA: Diagnosis not present

## 2020-01-19 DIAGNOSIS — G8194 Hemiplegia, unspecified affecting left nondominant side: Secondary | ICD-10-CM | POA: Diagnosis not present

## 2020-01-19 DIAGNOSIS — I69952 Hemiplegia and hemiparesis following unspecified cerebrovascular disease affecting left dominant side: Secondary | ICD-10-CM | POA: Diagnosis not present

## 2020-01-22 DIAGNOSIS — G8194 Hemiplegia, unspecified affecting left nondominant side: Secondary | ICD-10-CM | POA: Diagnosis not present

## 2020-01-22 DIAGNOSIS — I69952 Hemiplegia and hemiparesis following unspecified cerebrovascular disease affecting left dominant side: Secondary | ICD-10-CM | POA: Diagnosis not present

## 2020-01-22 DIAGNOSIS — M6281 Muscle weakness (generalized): Secondary | ICD-10-CM | POA: Diagnosis not present

## 2020-01-23 DIAGNOSIS — G8194 Hemiplegia, unspecified affecting left nondominant side: Secondary | ICD-10-CM | POA: Diagnosis not present

## 2020-01-23 DIAGNOSIS — I69952 Hemiplegia and hemiparesis following unspecified cerebrovascular disease affecting left dominant side: Secondary | ICD-10-CM | POA: Diagnosis not present

## 2020-01-23 DIAGNOSIS — M6281 Muscle weakness (generalized): Secondary | ICD-10-CM | POA: Diagnosis not present

## 2020-01-24 DIAGNOSIS — G8194 Hemiplegia, unspecified affecting left nondominant side: Secondary | ICD-10-CM | POA: Diagnosis not present

## 2020-01-24 DIAGNOSIS — I69952 Hemiplegia and hemiparesis following unspecified cerebrovascular disease affecting left dominant side: Secondary | ICD-10-CM | POA: Diagnosis not present

## 2020-01-24 DIAGNOSIS — M6281 Muscle weakness (generalized): Secondary | ICD-10-CM | POA: Diagnosis not present

## 2020-01-25 DIAGNOSIS — G8194 Hemiplegia, unspecified affecting left nondominant side: Secondary | ICD-10-CM | POA: Diagnosis not present

## 2020-01-25 DIAGNOSIS — M6281 Muscle weakness (generalized): Secondary | ICD-10-CM | POA: Diagnosis not present

## 2020-01-25 DIAGNOSIS — I69952 Hemiplegia and hemiparesis following unspecified cerebrovascular disease affecting left dominant side: Secondary | ICD-10-CM | POA: Diagnosis not present

## 2020-01-26 DIAGNOSIS — M6281 Muscle weakness (generalized): Secondary | ICD-10-CM | POA: Diagnosis not present

## 2020-01-26 DIAGNOSIS — G8194 Hemiplegia, unspecified affecting left nondominant side: Secondary | ICD-10-CM | POA: Diagnosis not present

## 2020-01-26 DIAGNOSIS — I69952 Hemiplegia and hemiparesis following unspecified cerebrovascular disease affecting left dominant side: Secondary | ICD-10-CM | POA: Diagnosis not present

## 2020-01-29 DIAGNOSIS — M6281 Muscle weakness (generalized): Secondary | ICD-10-CM | POA: Diagnosis not present

## 2020-01-29 DIAGNOSIS — I69952 Hemiplegia and hemiparesis following unspecified cerebrovascular disease affecting left dominant side: Secondary | ICD-10-CM | POA: Diagnosis not present

## 2020-01-29 DIAGNOSIS — G8194 Hemiplegia, unspecified affecting left nondominant side: Secondary | ICD-10-CM | POA: Diagnosis not present

## 2020-01-30 DIAGNOSIS — M6281 Muscle weakness (generalized): Secondary | ICD-10-CM | POA: Diagnosis not present

## 2020-01-30 DIAGNOSIS — I69952 Hemiplegia and hemiparesis following unspecified cerebrovascular disease affecting left dominant side: Secondary | ICD-10-CM | POA: Diagnosis not present

## 2020-01-30 DIAGNOSIS — G8194 Hemiplegia, unspecified affecting left nondominant side: Secondary | ICD-10-CM | POA: Diagnosis not present

## 2020-01-31 DIAGNOSIS — I69952 Hemiplegia and hemiparesis following unspecified cerebrovascular disease affecting left dominant side: Secondary | ICD-10-CM | POA: Diagnosis not present

## 2020-01-31 DIAGNOSIS — G8194 Hemiplegia, unspecified affecting left nondominant side: Secondary | ICD-10-CM | POA: Diagnosis not present

## 2020-01-31 DIAGNOSIS — M6281 Muscle weakness (generalized): Secondary | ICD-10-CM | POA: Diagnosis not present

## 2020-02-01 DIAGNOSIS — G8194 Hemiplegia, unspecified affecting left nondominant side: Secondary | ICD-10-CM | POA: Diagnosis not present

## 2020-02-01 DIAGNOSIS — I69952 Hemiplegia and hemiparesis following unspecified cerebrovascular disease affecting left dominant side: Secondary | ICD-10-CM | POA: Diagnosis not present

## 2020-02-01 DIAGNOSIS — M6281 Muscle weakness (generalized): Secondary | ICD-10-CM | POA: Diagnosis not present

## 2020-02-02 DIAGNOSIS — I69952 Hemiplegia and hemiparesis following unspecified cerebrovascular disease affecting left dominant side: Secondary | ICD-10-CM | POA: Diagnosis not present

## 2020-02-02 DIAGNOSIS — M6281 Muscle weakness (generalized): Secondary | ICD-10-CM | POA: Diagnosis not present

## 2020-02-02 DIAGNOSIS — G8194 Hemiplegia, unspecified affecting left nondominant side: Secondary | ICD-10-CM | POA: Diagnosis not present

## 2020-02-05 DIAGNOSIS — M6281 Muscle weakness (generalized): Secondary | ICD-10-CM | POA: Diagnosis not present

## 2020-02-05 DIAGNOSIS — G8194 Hemiplegia, unspecified affecting left nondominant side: Secondary | ICD-10-CM | POA: Diagnosis not present

## 2020-02-05 DIAGNOSIS — I69952 Hemiplegia and hemiparesis following unspecified cerebrovascular disease affecting left dominant side: Secondary | ICD-10-CM | POA: Diagnosis not present

## 2020-02-06 DIAGNOSIS — M6281 Muscle weakness (generalized): Secondary | ICD-10-CM | POA: Diagnosis not present

## 2020-02-06 DIAGNOSIS — G8194 Hemiplegia, unspecified affecting left nondominant side: Secondary | ICD-10-CM | POA: Diagnosis not present

## 2020-02-06 DIAGNOSIS — I69952 Hemiplegia and hemiparesis following unspecified cerebrovascular disease affecting left dominant side: Secondary | ICD-10-CM | POA: Diagnosis not present

## 2020-02-08 DIAGNOSIS — G4701 Insomnia due to medical condition: Secondary | ICD-10-CM | POA: Diagnosis not present

## 2020-02-19 DIAGNOSIS — J810 Acute pulmonary edema: Secondary | ICD-10-CM | POA: Diagnosis not present

## 2020-02-19 DIAGNOSIS — I509 Heart failure, unspecified: Secondary | ICD-10-CM | POA: Diagnosis not present

## 2020-02-20 DIAGNOSIS — I5032 Chronic diastolic (congestive) heart failure: Secondary | ICD-10-CM | POA: Diagnosis not present

## 2020-02-20 DIAGNOSIS — J449 Chronic obstructive pulmonary disease, unspecified: Secondary | ICD-10-CM | POA: Diagnosis not present

## 2020-02-20 DIAGNOSIS — I69354 Hemiplegia and hemiparesis following cerebral infarction affecting left non-dominant side: Secondary | ICD-10-CM | POA: Diagnosis not present

## 2020-02-20 DIAGNOSIS — I480 Paroxysmal atrial fibrillation: Secondary | ICD-10-CM | POA: Diagnosis not present

## 2020-02-20 DIAGNOSIS — I10 Essential (primary) hypertension: Secondary | ICD-10-CM | POA: Diagnosis not present

## 2020-02-20 DIAGNOSIS — E785 Hyperlipidemia, unspecified: Secondary | ICD-10-CM | POA: Diagnosis not present

## 2020-02-20 DIAGNOSIS — C3490 Malignant neoplasm of unspecified part of unspecified bronchus or lung: Secondary | ICD-10-CM | POA: Diagnosis not present

## 2020-02-21 ENCOUNTER — Telehealth (INDEPENDENT_AMBULATORY_CARE_PROVIDER_SITE_OTHER): Payer: Self-pay

## 2020-02-21 NOTE — Telephone Encounter (Signed)
Copied from [place] [id number]. Topic: General - Other >> Feb 20, 2020  3:30 PM Antonieta Iba C wrote: Reason for CRM: pt's POA /family member Ms. [name] is calling in for assistance. She says that pt is at a facility but is not getting in medical care. She says that pt's health has declined. Ms. [name] would like to know if possible could PCP see him as soon as possible?    Please assist pt further---- [phone]

## 2020-02-22 NOTE — Telephone Encounter (Signed)
Please schedule with [place] next available ok to double book

## 2020-02-26 ENCOUNTER — Inpatient Hospital Stay (HOSPITAL_COMMUNITY)
Admission: EM | Admit: 2020-02-26 | Discharge: 2020-03-02 | DRG: 177 | Disposition: A | Discharge status: Skilled Nursing Facility | Payer: Medicare Other | Source: Skilled Nursing Facility | Attending: Family Medicine | Admitting: Family Medicine

## 2020-02-26 ENCOUNTER — Encounter (HOSPITAL_COMMUNITY): Payer: Self-pay

## 2020-02-26 ENCOUNTER — Other Ambulatory Visit: Payer: Self-pay

## 2020-02-26 DIAGNOSIS — J1282 Pneumonia due to coronavirus disease 2019: Secondary | ICD-10-CM | POA: Diagnosis not present

## 2020-02-26 DIAGNOSIS — N179 Acute kidney failure, unspecified: Secondary | ICD-10-CM | POA: Diagnosis not present

## 2020-02-26 DIAGNOSIS — Z9049 Acquired absence of other specified parts of digestive tract: Secondary | ICD-10-CM

## 2020-02-26 DIAGNOSIS — F17290 Nicotine dependence, other tobacco product, uncomplicated: Secondary | ICD-10-CM | POA: Diagnosis present

## 2020-02-26 DIAGNOSIS — I69354 Hemiplegia and hemiparesis following cerebral infarction affecting left non-dominant side: Secondary | ICD-10-CM | POA: Diagnosis not present

## 2020-02-26 DIAGNOSIS — Z8673 Personal history of transient ischemic attack (TIA), and cerebral infarction without residual deficits: Secondary | ICD-10-CM | POA: Diagnosis not present

## 2020-02-26 DIAGNOSIS — Z6830 Body mass index (BMI) 30.0-30.9, adult: Secondary | ICD-10-CM

## 2020-02-26 DIAGNOSIS — F32A Depression, unspecified: Secondary | ICD-10-CM | POA: Diagnosis present

## 2020-02-26 DIAGNOSIS — Z9981 Dependence on supplemental oxygen: Secondary | ICD-10-CM

## 2020-02-26 DIAGNOSIS — I252 Old myocardial infarction: Secondary | ICD-10-CM

## 2020-02-26 DIAGNOSIS — I110 Hypertensive heart disease with heart failure: Secondary | ICD-10-CM | POA: Diagnosis present

## 2020-02-26 DIAGNOSIS — D72810 Lymphocytopenia: Secondary | ICD-10-CM | POA: Diagnosis not present

## 2020-02-26 DIAGNOSIS — J439 Emphysema, unspecified: Secondary | ICD-10-CM | POA: Diagnosis not present

## 2020-02-26 DIAGNOSIS — Z888 Allergy status to other drugs, medicaments and biological substances status: Secondary | ICD-10-CM

## 2020-02-26 DIAGNOSIS — C3411 Malignant neoplasm of upper lobe, right bronchus or lung: Secondary | ICD-10-CM | POA: Diagnosis present

## 2020-02-26 DIAGNOSIS — E870 Hyperosmolality and hypernatremia: Secondary | ICD-10-CM | POA: Diagnosis not present

## 2020-02-26 DIAGNOSIS — E876 Hypokalemia: Secondary | ICD-10-CM | POA: Diagnosis present

## 2020-02-26 DIAGNOSIS — L89153 Pressure ulcer of sacral region, stage 3: Secondary | ICD-10-CM | POA: Diagnosis not present

## 2020-02-26 DIAGNOSIS — E861 Hypovolemia: Secondary | ICD-10-CM | POA: Diagnosis present

## 2020-02-26 DIAGNOSIS — I2510 Atherosclerotic heart disease of native coronary artery without angina pectoris: Secondary | ICD-10-CM | POA: Diagnosis not present

## 2020-02-26 DIAGNOSIS — L98429 Non-pressure chronic ulcer of back with unspecified severity: Secondary | ICD-10-CM | POA: Diagnosis present

## 2020-02-26 DIAGNOSIS — J069 Acute upper respiratory infection, unspecified: Secondary | ICD-10-CM | POA: Diagnosis present

## 2020-02-26 DIAGNOSIS — N289 Disorder of kidney and ureter, unspecified: Secondary | ICD-10-CM | POA: Diagnosis not present

## 2020-02-26 DIAGNOSIS — J9621 Acute and chronic respiratory failure with hypoxia: Secondary | ICD-10-CM | POA: Diagnosis not present

## 2020-02-26 DIAGNOSIS — I700 Atherosclerosis of aorta: Secondary | ICD-10-CM | POA: Diagnosis not present

## 2020-02-26 DIAGNOSIS — J90 Pleural effusion, not elsewhere classified: Secondary | ICD-10-CM | POA: Diagnosis not present

## 2020-02-26 DIAGNOSIS — I5032 Chronic diastolic (congestive) heart failure: Secondary | ICD-10-CM | POA: Diagnosis present

## 2020-02-26 DIAGNOSIS — I517 Cardiomegaly: Secondary | ICD-10-CM | POA: Diagnosis not present

## 2020-02-26 DIAGNOSIS — E1151 Type 2 diabetes mellitus with diabetic peripheral angiopathy without gangrene: Secondary | ICD-10-CM | POA: Diagnosis present

## 2020-02-26 DIAGNOSIS — Z86718 Personal history of other venous thrombosis and embolism: Secondary | ICD-10-CM

## 2020-02-26 DIAGNOSIS — G4733 Obstructive sleep apnea (adult) (pediatric): Secondary | ICD-10-CM | POA: Diagnosis present

## 2020-02-26 DIAGNOSIS — Z96659 Presence of unspecified artificial knee joint: Secondary | ICD-10-CM | POA: Diagnosis present

## 2020-02-26 DIAGNOSIS — C3491 Malignant neoplasm of unspecified part of right bronchus or lung: Secondary | ICD-10-CM | POA: Diagnosis not present

## 2020-02-26 DIAGNOSIS — Z66 Do not resuscitate: Secondary | ICD-10-CM | POA: Diagnosis present

## 2020-02-26 DIAGNOSIS — U071 COVID-19: Secondary | ICD-10-CM | POA: Diagnosis not present

## 2020-02-26 DIAGNOSIS — M1990 Unspecified osteoarthritis, unspecified site: Secondary | ICD-10-CM | POA: Diagnosis present

## 2020-02-26 DIAGNOSIS — Z886 Allergy status to analgesic agent status: Secondary | ICD-10-CM

## 2020-02-26 DIAGNOSIS — J9811 Atelectasis: Secondary | ICD-10-CM | POA: Diagnosis present

## 2020-02-26 DIAGNOSIS — K219 Gastro-esophageal reflux disease without esophagitis: Secondary | ICD-10-CM | POA: Diagnosis present

## 2020-02-26 DIAGNOSIS — R059 Cough, unspecified: Secondary | ICD-10-CM | POA: Diagnosis not present

## 2020-02-26 DIAGNOSIS — J449 Chronic obstructive pulmonary disease, unspecified: Secondary | ICD-10-CM | POA: Diagnosis not present

## 2020-02-26 DIAGNOSIS — E1142 Type 2 diabetes mellitus with diabetic polyneuropathy: Secondary | ICD-10-CM | POA: Diagnosis present

## 2020-02-26 DIAGNOSIS — R0902 Hypoxemia: Secondary | ICD-10-CM

## 2020-02-26 DIAGNOSIS — D6959 Other secondary thrombocytopenia: Secondary | ICD-10-CM | POA: Diagnosis present

## 2020-02-26 DIAGNOSIS — Z79899 Other long term (current) drug therapy: Secondary | ICD-10-CM

## 2020-02-26 DIAGNOSIS — F0280 Dementia in other diseases classified elsewhere without behavioral disturbance: Secondary | ICD-10-CM | POA: Diagnosis not present

## 2020-02-26 DIAGNOSIS — Z743 Need for continuous supervision: Secondary | ICD-10-CM | POA: Diagnosis not present

## 2020-02-26 DIAGNOSIS — R0602 Shortness of breath: Secondary | ICD-10-CM | POA: Diagnosis not present

## 2020-02-26 DIAGNOSIS — E871 Hypo-osmolality and hyponatremia: Secondary | ICD-10-CM | POA: Diagnosis present

## 2020-02-26 DIAGNOSIS — E785 Hyperlipidemia, unspecified: Secondary | ICD-10-CM | POA: Diagnosis present

## 2020-02-26 DIAGNOSIS — F419 Anxiety disorder, unspecified: Secondary | ICD-10-CM | POA: Diagnosis present

## 2020-02-26 DIAGNOSIS — E669 Obesity, unspecified: Secondary | ICD-10-CM | POA: Diagnosis present

## 2020-02-26 DIAGNOSIS — Z885 Allergy status to narcotic agent status: Secondary | ICD-10-CM

## 2020-02-26 DIAGNOSIS — Z8701 Personal history of pneumonia (recurrent): Secondary | ICD-10-CM

## 2020-02-26 DIAGNOSIS — Z8249 Family history of ischemic heart disease and other diseases of the circulatory system: Secondary | ICD-10-CM

## 2020-02-26 DIAGNOSIS — I10 Essential (primary) hypertension: Secondary | ICD-10-CM | POA: Diagnosis present

## 2020-02-26 DIAGNOSIS — Z7902 Long term (current) use of antithrombotics/antiplatelets: Secondary | ICD-10-CM

## 2020-02-26 DIAGNOSIS — Z8719 Personal history of other diseases of the digestive system: Secondary | ICD-10-CM

## 2020-02-26 DIAGNOSIS — G40909 Epilepsy, unspecified, not intractable, without status epilepticus: Secondary | ICD-10-CM | POA: Diagnosis present

## 2020-02-26 DIAGNOSIS — L8990 Pressure ulcer of unspecified site, unspecified stage: Secondary | ICD-10-CM | POA: Diagnosis present

## 2020-02-26 NOTE — ED Provider Notes (Addendum)
[facility] DE[facility] Provider Note   CSN: [id number] Arrival date & time: 02/26/20  2309     History Chief Complaint  Patient presents with  . Shortness of Breath    [name] is a 70 y.o. male.  Patient with a history of O2 dependent COPD, HTN, HLD, CAD, dementia, CVA with resultant hemiplegia, dCHF, GERD, seizures recent diagnosis COVID per EMS report, presents from [facility] facility after he was found to be hypoxic to 47% while on his baseline 2 L O2. The patient is awake, responsive on arrival, states "it burns all over" and this is why he is here. No vomiting reported. Discussed the patient with staff at [facility] [place]) who reports symptoms were limited to finding him hypoxic. No fever. COVID test performed today at the Center.   The history is provided by the EMS personnel and medical records. No language interpreter was used.  Shortness of Breath      Past Medical History:  Diagnosis Date  . Acute blood loss anemia   . Acute CVA (cerebrovascular accident) ([place]) 08/27/2014  . Acute encephalopathy 11/28/2017  . Acute ischemic stroke ([place])   . AKI (acute kidney injury) ([place])   . Alcohol abuse    H/O  . Alcohol dependence with withdrawal with complication ([place]) 07/30/3084  . Anxiety   . Arterial ischemic stroke, MCA (middle cerebral artery), right, acute ([place])   . Arthritis   . Asthma   . Binocular vision disorder with diplopia 01/03/2013  . CAD (coronary artery disease) 05/27/10   Cath: severe single vessell CAD left cx midportion obtuse marginal 2 to 3.  . Cerebral infarction due to embolism of right middle cerebral artery ([place])   . Cerebral infarction due to stenosis of right carotid artery ([place]) 11/01/2014  . Cerebral thrombosis with cerebral infarction 07/18/2016  . Cerebrovascular accident (CVA) due to stenosis of right carotid artery ([place])   . CHF (congestive heart failure) ([place])   . Closed blow-out  fracture of floor of orbit ([place]) 01/19/2013  . COPD (chronic obstructive pulmonary disease) ([place])   . COPD (chronic obstructive pulmonary disease) ([place])   . CVA (cerebral vascular accident) ([place]) 08/26/2014  . Dementia due to another medical condition ([place]) 07/12/2015  . Depression   . Diabetes mellitus, type 2 ([employer]) 03/13/2015  . Diastolic CHF, acute on chronic (HCC) 11/01/2014  . DOE (dyspnea on exertion) 11/23/2014  . DVT (deep venous thrombosis) ([place]) 02/22/2014  . Embolic stroke ([place])   . Enophthalmos due to trauma 01/19/2013  . Eyelid lesion 01/24/2013  . Fracture of inferior orbital wall ([place]) 01/03/2013  . Fracture of orbital floor ([place]) 01/03/2013  . GERD (gastroesophageal reflux disease)   . H/O total knee replacement 10/24/2014  . HCAP (healthcare-associated pneumonia) 10/15/2015  . Headache around the eyes 08/27/2014  . History of DVT (deep vein thrombosis)   . History of DVT of lower extremity   . Hyperkalemia 10/24/2014  . Hyperlipemia   . Hyperlipidemia   . Hypertension   . Hypoalbuminemia due to protein-calorie malnutrition ([place])   . Hypotension   . Labile blood pressure   . Left shoulder pain 09/28/2017  . Lower extremity edema 02/21/2014  . Medial orbital wall fracture (HCC) 01/19/2013  . Myocardial infarction ([place]) 2000  . Orbital fracture ([place]) 12/2012  . OSA on CPAP   . Pain of left eye 01/03/2013  . Peripheral vascular disease, unspecified ([place])    08/20/10 doppler: increase in  right ABI post-op. Left ABI stable. S/P bi-fem bypass surgery  . Pneumonia 04/04/2012  . Right middle cerebral artery stroke ([place]) 01/18/2018  . Seizures ([place])   . Shortness of breath   . Stroke (cerebrum) ([place]) 02/06/2015  . Thrombocytopenia ([place])   . Tobacco abuse   . Trichiasis without entropion 04/16/2013   Overview:  LLL central   . Type 2 diabetes mellitus with peripheral neuropathy ([place]) 03/13/2015  . Urinary incontinence 03/14/2015    Patient Active Problem List   Diagnosis Date  Noted  . Adenocarcinoma of right lung, stage 1 ([place]) 04/26/2019  . Goals of care, counseling/discussion 10/12/2018  . Gastric and duodenal angiodysplasia   . Polyp of descending colon   . Polyp of rectum   . Iron deficiency anemia   . Abnormal CT scan, colon   . Heme + stool   . Lung mass   . Symptomatic anemia 08/22/2018  . IV site infection ([place]) 03/31/2018  . Left leg weakness   . Stenosis of brachiocephalic artery ([place]) 69/67/8938  . Lung nodule, solitary 03/28/2018  . Solitary pulmonary nodule on lung CT   . Normocytic anemia 03/26/2018  . Abdominal pain, acute, left upper quadrant   . Dysarthria   . Recurrent abdominal pain   . Dementia due to another medical condition, without behavioral disturbance ([place])   . Pruritus   . Left hemiplegia ([place])   . Insomnia 01/29/2016  . Psoriasiform dermatitis 07/12/2015  . Type 2 diabetes mellitus with peripheral neuropathy ([place]) 03/13/2015  . History of Hemiparesis affecting left side as late effect of cerebrovascular accident ([place]) 02/18/2015  . Coronary artery disease involving native coronary artery of native heart without angina pectoris   . History of stroke   . PAF (paroxysmal atrial fibrillation) ([place]) 11/23/2014  . HLD (hyperlipidemia) 11/01/2014  . Tobacco use disorder 11/01/2014  . Carotid stenosis 11/01/2014  . Essential hypertension   . Polysubstance abuse ([place]) 08/28/2014  . Cocaine abuse ([place])   . Intracranial carotid stenosis   . Alcohol use disorder, moderate, dependence ([place])   . Stroke ([place])   . Chronic diastolic congestive heart failure ([place]) 11/21/2013  . GERD (gastroesophageal reflux disease) 08/10/2013  . Gout 08/10/2013  . Peripheral vascular disease ([place]) 07/27/2012  . COPD (chronic obstructive pulmonary disease) ([place]) 04/04/2012    Past Surgical History:  Procedure Laterality Date  . abdoninal ao angio & bifem angio  05/27/10   Patent graft, occluded bil stents with no retrograde flow into the  hypogastric arteries. 100% occl left ant. tibial artery, 70% to 80% to 100% stenosis right superficial fem artery above adductor canal. 100 % occl right ant. tibial vessell  . Aortogram w/ PTCA  09/292003  . BACK SURGERY    . BIOPSY  08/26/2018   Procedure: BIOPSY;  Surgeon: Lavena Bullion, DO;  Location: [facility] ENDOS[facility];  Service: Gastroenterology;;  . CARDIAC CATHETERIZATION  05/27/10   severe CAD left cx  . CHOLECYSTECTOMY    . ESOPHAGOGASTRIC FUNDOPLICATION    . ESOPHAGOGASTRODUODENOSCOPY (EGD) WITH PROPOFOL N/A 08/26/2018   Procedure: ESOPHAGOGASTRODUODENOSCOPY (EGD) WITH PROPOFOL;  Surgeon: Lavena Bullion, DO;  Location: [facility];  Service: Gastroenterology;  Laterality: N/A;  . EYE SURGERY    . FEMORAL BYPASS  08/19/10   Right Fem-Pop  . FLEXIBLE SIGMOIDOSCOPY N/A 08/26/2018   Procedure: FLEXIBLE SIGMOIDOSCOPY;  Surgeon: Lavena Bullion, DO;  Location: [facility];  Service: Gastroenterology;  Laterality: N/A;  . HOT HEMOSTASIS N/A 08/26/2018   Procedure: HOT  HEMOSTASIS (ARGON PLASMA COAGULATION/BICAP);  Surgeon: Lavena Bullion, DO;  Location: [facility] ENDOSCOPY;  Service: Gastroenterology;  Laterality: N/A;  . IR ANGIO INTRA EXTRACRAN SEL COM CAROTID INNOMINATE BILAT MOD SED  07/20/2016  . IR ANGIO INTRA EXTRACRAN SEL COM CAROTID INNOMINATE BILAT MOD SED  01/14/2018  . IR ANGIO VERTEBRAL SEL SUBCLAVIAN INNOMINATE UNI R MOD SED  07/20/2016  . IR ANGIO VERTEBRAL SEL VERTEBRAL BILAT MOD SED  01/14/2018  . IR ANGIO VERTEBRAL SEL VERTEBRAL UNI L MOD SED  07/20/2016  . IR RADIOLOGIST EVAL & MGMT  05/27/2016  . IR US GUIDE VASC ACCESS RIGHT  01/14/2018  . JOINT REPLACEMENT    . PERIPHERAL VASCULAR CATHETERIZATION N/A 12/10/2014   Procedure: Abdominal Aortogram;  Surgeon: Angelia Mould, MD;  Location: [facility];  Service: Cardiovascular;  Laterality: N/A;  . POLYPECTOMY  08/26/2018   Procedure: POLYPECTOMY;  Surgeon: Lavena Bullion, DO;  Location: [facility] ENDOSCOPY;   Service: Gastroenterology;;  . RADIOLOGY WITH ANESTHESIA N/A 02/08/2015   Procedure: RADIOLOGY WITH ANESTHESIA;  Surgeon: Luanne Bras, MD;  Location: [facility];  Service: Radiology;  Laterality: N/A;  . TEE WITHOUT CARDIOVERSION N/A 08/29/2014   Procedure: TRANSESOPHAGEAL ECHOCARDIOGRAM (TEE);  Surgeon: Josue Hector, MD;  Location[facility] END[facility]COPY;  Service: Cardiovascular;  Laterality: N/A;  . TOTAL KNEE ARTHROPLASTY         Family History  Problem Relation Age of Onset  . Heart attack Mother   . Heart failure Mother   . Cirrhosis Father   . Heart failure Brother   . Cancer Brother   . Hypertension Neg Hx        UNKNOWN  . Stroke Neg Hx        UNKNOWN    Social History   Tobacco Use  . Smoking status: Current Every Day Smoker    Packs/day: 0.50    Years: 50.00    Pack years: 25.00    Types: Cigars, Cigarettes    Start date: 02/09/1961  . Smokeless tobacco: Former Systems developer    Types: Chew  . Tobacco comment: 2 ppd, full flavor  Substance Use Topics  . Alcohol use: Yes    Comment: occasionally, "I might drink 1 every 6 months"  . Drug use: Yes    Types: Cocaine    Comment: "I smoke crack every once in a while"; last smoked yesterday    Home Medications Prior to Admission medications   Medication Sig Start Date End Date Taking? Authorizing Provider  acetaminophen (TYLENOL) 325 MG tablet Take 2 tablets (650 mg total) by mouth every 4 (four) hours as needed for mild pain (or temp > 37.5 C (99.5 F)). Patient not taking: Reported on 10/24/2018 04/01/18   Daisy Floro, DO  allopurinol (ZYLOPRIM) 300 MG tablet Take 1 tablet (300 mg total) by mouth daily. 04/01/18   Daisy Floro, DO  atorvastatin (LIPITOR) 80 MG tablet Take 1 tablet (80 mg total) by mouth daily at 6 PM. Patient taking differently: Take 80 mg by mouth daily.  04/01/18   Daisy Floro, DO  camphor-menthol [facility]) lotion Apply topically as needed for itching. 04/01/18   Daisy Floro, DO   Cholecalciferol 25 MCG (1000 UT) tablet Take 1,000 Units by mouth daily.    [provider]  clopidogrel (PLAVIX) 75 MG tablet Take 1 tablet (75 mg total) by mouth daily. 04/01/18   Daisy Floro, DO  docusate sodium (COLACE) 100 MG capsule Take 100 mg by mouth  daily.    [provider]  DULoxetine (CYMBALTA) 20 MG capsule Take 20 mg by mouth 2 (two) times daily. 04/20/19   [provider]  ferrous sulfate 325 (65 FE) MG tablet Take 1 tablet (325 mg total) by mouth daily with breakfast. 08/26/18   Carollee Leitz, MD  folic acid (FOLVITE) 1 MG tablet Take 1 tablet (1 mg total) by mouth daily. 04/01/18   Daisy Floro, DO  furosemide (LASIX) 40 MG tablet Take 40 mg by mouth daily. 04/21/19   [provider]  guaiFENesin (MUCINEX) 600 MG 12 hr tablet Take 1,200 mg by mouth 2 (two) times daily.    [provider]  hydrocortisone 2.5 % cream Apply 1 application topically every 6 (six) hours as needed (itchiness).    [provider]  Infant Care Products (DERMACLOUD) CREA Apply 1 application topically 2 (two) times daily. Apply to buttocks    [provider]  ipratropium-albuterol (DUONEB) 0.5-2.5 (3) MG/3ML SOLN SMARTSIG:3 Milliliter(s) Via Nebulizer Every 6 Hours 04/17/19   [provider]  levETIRAcetam (KEPPRA) 500 MG tablet Take 1 tablet (500 mg total) by mouth 2 (two) times daily. 04/01/18   Daisy Floro, DO  losartan (COZAAR) 25 MG tablet Take 1 tablet (25 mg total) by mouth daily. 04/02/18   Daisy Floro, DO  Melatonin 5 MG TABS Take 5 mg by mouth at bedtime.    [provider]  memantine (NAMENDA) 10 MG tablet Take 1 tablet (10 mg total) by mouth 2 (two) times daily. 04/01/18   Daisy Floro, DO  mirtazapine (REMERON) 7.5 MG tablet Take 7.5 mg by mouth at bedtime. 03/31/19   [provider]  mometasone-formoterol (DULERA) 200-5 MCG/ACT AERO Inhale 2 puffs into the lungs 2 (two) times  daily. Patient not taking: Reported on 10/24/2018 04/01/18   Milus Banister C, DO  pantoprazole (PROTONIX) 40 MG tablet Take 1 tablet (40 mg total) by mouth daily. 04/01/18   Daisy Floro, DO  polyethylene glycol (MIRALAX / GLYCOLAX) 17 g packet Take 17 g by mouth daily as needed for moderate constipation.    [provider]  potassium chloride SA (KLOR-CON) 20 MEQ tablet Take 20 mEq by mouth daily. 04/08/19   [provider]  pregabalin (LYRICA) 150 MG capsule Take 1 capsule (150 mg total) by mouth 2 (two) times daily. 04/01/18   Daisy Floro, DO  Skin Protectants, Misc. (EUCERIN) cream Apply 1 application topically daily.    [provider]  tiZANidine (ZANAFLEX) 2 MG tablet Take 2 mg by mouth 3 (three) times daily.    [provider]  traZODone (DESYREL) 100 MG tablet Take 100 mg by mouth at bedtime.    [provider]  Grant Ruts INHUB 250-50 MCG/DOSE AEPB Inhale 1-2 puffs into the lungs daily. Patient taking differently: Inhale 2 puffs into the lungs daily.  04/01/18   Daisy Floro, DO    Allergies    Darvocet [propoxyphene n-acetaminophen], Haldol [haloperidol decanoate], Acetaminophen, Metformin and related, and Norco [hydrocodone-acetaminophen]  Review of Systems   Review of Systems  Unable to perform ROS: Dementia  Respiratory: Positive for shortness of breath.     Physical Exam Updated Vital Signs BP 122/62   Pulse 98   Temp 98.4 F (36.9 C) (Oral)   Resp (!) 29   SpO2 95%   Physical Exam Vitals and nursing note reviewed.  Constitutional:      General: He is not in acute distress.  Appearance: He is well-developed. He is obese.     Comments: Chronically ill appearing  HENT:     Head: Normocephalic and atraumatic.  Cardiovascular:     Rate and Rhythm: Normal rate and regular rhythm.  Pulmonary:     Effort: Pulmonary effort is normal.     Breath sounds: Rhonchi (Diffuse) present. No wheezing.  Abdominal:      Palpations: Abdomen is soft.     Tenderness: There is no abdominal tenderness.  Musculoskeletal:     Cervical back: Normal range of motion.     Comments: Left lower leg bandaged. No swelling over distal or proximal borders. No redness.   Skin:    General: Skin is warm and dry.     Findings: No erythema.  Neurological:     Comments: Speech largely incomprehensible, follows command, moves all extremities.     ED Results / Procedures / Treatments   Labs (all labs ordered are listed, but only abnormal results are displayed) Labs Reviewed - No data to display  EKG None  Radiology No results found.  Procedures Procedures (including critical care time)  Medications Ordered in ED Medications - No data to display  ED Course  I have reviewed the triage vital signs and the nursing notes.  Pertinent labs & imaging results that were available during my care of the patient were reviewed by me and considered in my medical decision making (see chart for details).    MDM Rules/Calculators/A&P                          Patient with complicated medical history to ED after being found hypoxic while on his baseline Oxygen (2L) to 47%, COVID positive today. No fever.   Labs were obtained. COVID infection confirmed. Mild dehydration likely. O2 saturations continue to be low when trying to wean him down. Initially on NRB on arrival, taken down to 5L via [place] where he maintained 90's saturations. Attempt to bring him to baseline of 2L resulted in drop to mid-80's and tachypnea. Minimal improvement at 3 L. O2 now returned to 5L. Continues to be tachypneic. Decadron provided.   Feel with multiple comorbidities, the patient would benefit from admission to the hospital for remdesivir , observation prior to returning to [facility].  Final Clinical Impression(s) / ED Diagnoses Final diagnoses:  None   1. COVID 2. Hypoxia  Rx / DC Orders ED Discharge Orders    None       Charlann Lange, PA-C 02/27/20 0422    Charlann Lange, PA-C 02/27/20 0423    Merryl Hacker, MD 02/27/20 [phone]

## 2020-02-26 NOTE — ED Notes (Signed)
Patient place on 5L O2 and saturation maintaining at 93%

## 2020-02-26 NOTE — ED Triage Notes (Signed)
Patient arrived via [facility] from [place] with an oxygen saturation of 47%, wears 2L O2 at baseline. Patient arrived on NRB at 95% , positive for covid-19.

## 2020-02-27 ENCOUNTER — Emergency Department (HOSPITAL_COMMUNITY): Payer: Medicare Other

## 2020-02-27 ENCOUNTER — Encounter (HOSPITAL_COMMUNITY): Payer: Self-pay | Admitting: Family Medicine

## 2020-02-27 ENCOUNTER — Inpatient Hospital Stay (HOSPITAL_COMMUNITY): Payer: Medicare Other

## 2020-02-27 DIAGNOSIS — Z8673 Personal history of transient ischemic attack (TIA), and cerebral infarction without residual deficits: Secondary | ICD-10-CM | POA: Diagnosis not present

## 2020-02-27 DIAGNOSIS — Z743 Need for continuous supervision: Secondary | ICD-10-CM | POA: Diagnosis not present

## 2020-02-27 DIAGNOSIS — J90 Pleural effusion, not elsewhere classified: Secondary | ICD-10-CM | POA: Diagnosis present

## 2020-02-27 DIAGNOSIS — I10 Essential (primary) hypertension: Secondary | ICD-10-CM

## 2020-02-27 DIAGNOSIS — E871 Hypo-osmolality and hyponatremia: Secondary | ICD-10-CM | POA: Diagnosis present

## 2020-02-27 DIAGNOSIS — C3411 Malignant neoplasm of upper lobe, right bronchus or lung: Secondary | ICD-10-CM | POA: Diagnosis present

## 2020-02-27 DIAGNOSIS — U071 COVID-19: Principal | ICD-10-CM

## 2020-02-27 DIAGNOSIS — Z86718 Personal history of other venous thrombosis and embolism: Secondary | ICD-10-CM | POA: Diagnosis not present

## 2020-02-27 DIAGNOSIS — J9621 Acute and chronic respiratory failure with hypoxia: Secondary | ICD-10-CM | POA: Diagnosis present

## 2020-02-27 DIAGNOSIS — C3491 Malignant neoplasm of unspecified part of right bronchus or lung: Secondary | ICD-10-CM | POA: Diagnosis not present

## 2020-02-27 DIAGNOSIS — D72810 Lymphocytopenia: Secondary | ICD-10-CM | POA: Diagnosis present

## 2020-02-27 DIAGNOSIS — R0600 Dyspnea, unspecified: Secondary | ICD-10-CM | POA: Diagnosis not present

## 2020-02-27 DIAGNOSIS — E861 Hypovolemia: Secondary | ICD-10-CM | POA: Diagnosis present

## 2020-02-27 DIAGNOSIS — Z66 Do not resuscitate: Secondary | ICD-10-CM | POA: Diagnosis present

## 2020-02-27 DIAGNOSIS — E876 Hypokalemia: Secondary | ICD-10-CM | POA: Diagnosis present

## 2020-02-27 DIAGNOSIS — I517 Cardiomegaly: Secondary | ICD-10-CM | POA: Diagnosis not present

## 2020-02-27 DIAGNOSIS — N179 Acute kidney failure, unspecified: Secondary | ICD-10-CM | POA: Diagnosis present

## 2020-02-27 DIAGNOSIS — N289 Disorder of kidney and ureter, unspecified: Secondary | ICD-10-CM | POA: Diagnosis not present

## 2020-02-27 DIAGNOSIS — R059 Cough, unspecified: Secondary | ICD-10-CM | POA: Diagnosis not present

## 2020-02-27 DIAGNOSIS — I5032 Chronic diastolic (congestive) heart failure: Secondary | ICD-10-CM | POA: Diagnosis not present

## 2020-02-27 DIAGNOSIS — D6959 Other secondary thrombocytopenia: Secondary | ICD-10-CM | POA: Diagnosis present

## 2020-02-27 DIAGNOSIS — J9811 Atelectasis: Secondary | ICD-10-CM | POA: Diagnosis present

## 2020-02-27 DIAGNOSIS — J449 Chronic obstructive pulmonary disease, unspecified: Secondary | ICD-10-CM | POA: Diagnosis not present

## 2020-02-27 DIAGNOSIS — F0280 Dementia in other diseases classified elsewhere without behavioral disturbance: Secondary | ICD-10-CM

## 2020-02-27 DIAGNOSIS — M2550 Pain in unspecified joint: Secondary | ICD-10-CM | POA: Diagnosis not present

## 2020-02-27 DIAGNOSIS — I69354 Hemiplegia and hemiparesis following cerebral infarction affecting left non-dominant side: Secondary | ICD-10-CM | POA: Diagnosis not present

## 2020-02-27 DIAGNOSIS — J439 Emphysema, unspecified: Secondary | ICD-10-CM | POA: Diagnosis not present

## 2020-02-27 DIAGNOSIS — G40909 Epilepsy, unspecified, not intractable, without status epilepticus: Secondary | ICD-10-CM | POA: Diagnosis present

## 2020-02-27 DIAGNOSIS — R0602 Shortness of breath: Secondary | ICD-10-CM | POA: Diagnosis present

## 2020-02-27 DIAGNOSIS — E870 Hyperosmolality and hypernatremia: Secondary | ICD-10-CM | POA: Diagnosis present

## 2020-02-27 DIAGNOSIS — L8990 Pressure ulcer of unspecified site, unspecified stage: Secondary | ICD-10-CM | POA: Diagnosis present

## 2020-02-27 DIAGNOSIS — R0902 Hypoxemia: Secondary | ICD-10-CM | POA: Diagnosis not present

## 2020-02-27 DIAGNOSIS — I110 Hypertensive heart disease with heart failure: Secondary | ICD-10-CM | POA: Diagnosis present

## 2020-02-27 DIAGNOSIS — J069 Acute upper respiratory infection, unspecified: Secondary | ICD-10-CM | POA: Diagnosis present

## 2020-02-27 DIAGNOSIS — I2510 Atherosclerotic heart disease of native coronary artery without angina pectoris: Secondary | ICD-10-CM | POA: Diagnosis not present

## 2020-02-27 DIAGNOSIS — I700 Atherosclerosis of aorta: Secondary | ICD-10-CM | POA: Diagnosis present

## 2020-02-27 DIAGNOSIS — R5381 Other malaise: Secondary | ICD-10-CM | POA: Diagnosis not present

## 2020-02-27 DIAGNOSIS — Z9049 Acquired absence of other specified parts of digestive tract: Secondary | ICD-10-CM | POA: Diagnosis not present

## 2020-02-27 DIAGNOSIS — L98429 Non-pressure chronic ulcer of back with unspecified severity: Secondary | ICD-10-CM | POA: Diagnosis present

## 2020-02-27 DIAGNOSIS — Z7401 Bed confinement status: Secondary | ICD-10-CM | POA: Diagnosis not present

## 2020-02-27 DIAGNOSIS — J1282 Pneumonia due to coronavirus disease 2019: Secondary | ICD-10-CM | POA: Diagnosis present

## 2020-02-27 DIAGNOSIS — L89153 Pressure ulcer of sacral region, stage 3: Secondary | ICD-10-CM | POA: Diagnosis present

## 2020-02-27 LAB — CBC WITH DIFFERENTIAL/PLATELET
Abs Immature Granulocytes: 0.08 10*3/uL — ABNORMAL HIGH (ref 0.00–0.07)
Basophils Absolute: 0 10*3/uL (ref 0.0–0.1)
Basophils Relative: 0 %
Eosinophils Absolute: 0 10*3/uL (ref 0.0–0.5)
Eosinophils Relative: 0 %
HCT: 43.8 % (ref 39.0–52.0)
Hemoglobin: 13.3 g/dL (ref 13.0–17.0)
Immature Granulocytes: 1 %
Lymphocytes Relative: 10 %
Lymphs Abs: 0.5 10*3/uL — ABNORMAL LOW (ref 0.7–4.0)
MCH: 29.1 pg (ref 26.0–34.0)
MCHC: 30.4 g/dL (ref 30.0–36.0)
MCV: 95.8 fL (ref 80.0–100.0)
Monocytes Absolute: 0.7 10*3/uL (ref 0.1–1.0)
Monocytes Relative: 13 %
Neutro Abs: 4.2 10*3/uL (ref 1.7–7.7)
Neutrophils Relative %: 76 %
Platelets: 142 10*3/uL — ABNORMAL LOW (ref 150–400)
RBC: 4.57 MIL/uL (ref 4.22–5.81)
RDW: 15.4 % (ref 11.5–15.5)
WBC: 5.5 10*3/uL (ref 4.0–10.5)
nRBC: 0 % (ref 0.0–0.2)

## 2020-02-27 LAB — FERRITIN: Ferritin: 124 ng/mL (ref 24–336)

## 2020-02-27 LAB — CBG MONITORING, ED
Glucose-Capillary: 148 mg/dL — ABNORMAL HIGH (ref 70–99)
Glucose-Capillary: 164 mg/dL — ABNORMAL HIGH (ref 70–99)
Glucose-Capillary: 148 mg/dL — ABNORMAL HIGH (ref 70–99)

## 2020-02-27 LAB — COMPREHENSIVE METABOLIC PANEL
ALT: 20 U/L (ref 0–44)
AST: 40 U/L (ref 15–41)
Albumin: 3.5 g/dL (ref 3.5–5.0)
Alkaline Phosphatase: 59 U/L (ref 38–126)
Anion gap: 12 (ref 5–15)
BUN: 60 mg/dL — ABNORMAL HIGH (ref 8–23)
CO2: 27 mmol/L (ref 22–32)
Calcium: 8.9 mg/dL (ref 8.9–10.3)
Chloride: 107 mmol/L (ref 98–111)
Creatinine, Ser: 1.32 mg/dL — ABNORMAL HIGH (ref 0.61–1.24)
GFR, Estimated: 58 mL/min — ABNORMAL LOW (ref >60)
Glucose, Bld: 165 mg/dL — ABNORMAL HIGH (ref 70–99)
Potassium: 4.3 mmol/L (ref 3.5–5.1)
Sodium: 146 mmol/L — ABNORMAL HIGH (ref 135–145)
Total Bilirubin: 0.9 mg/dL (ref 0.3–1.2)
Total Protein: 7 g/dL (ref 6.5–8.1)

## 2020-02-27 LAB — RESP PANEL BY RT-PCR (FLU A&B, COVID) ARPGX2
Influenza A by PCR: NEGATIVE
Influenza B by PCR: NEGATIVE
SARS Coronavirus 2 by RT PCR: POSITIVE — AB

## 2020-02-27 LAB — PROCALCITONIN: Procalcitonin: 0.18 ng/mL

## 2020-02-27 LAB — D-DIMER, QUANTITATIVE: D-Dimer, Quant: 1.61 ug/mL-FEU — ABNORMAL HIGH (ref 0.00–0.50)

## 2020-02-27 LAB — C-REACTIVE PROTEIN: CRP: 7.1 mg/dL — ABNORMAL HIGH (ref <1.0)

## 2020-02-27 LAB — BRAIN NATRIURETIC PEPTIDE: B Natriuretic Peptide: 77.9 pg/mL (ref 0.0–100.0)

## 2020-02-27 MED ORDER — SODIUM CHLORIDE 0.9 % IV SOLN
200.0000 mg | Freq: Once | INTRAVENOUS | Status: AC
  Administered 2020-02-27: 200 mg via INTRAVENOUS
  Filled 2020-02-27: qty 200

## 2020-02-27 MED ORDER — INSULIN ASPART 100 UNIT/ML ~~LOC~~ SOLN
0.0000 [IU] | Freq: Three times a day (TID) | SUBCUTANEOUS | Status: DC
  Administered 2020-02-27 – 2020-02-28 (×3): 1 [IU] via SUBCUTANEOUS
  Administered 2020-02-28: 2 [IU] via SUBCUTANEOUS
  Administered 2020-02-29: 1 [IU] via SUBCUTANEOUS
  Administered 2020-02-29 – 2020-03-01 (×2): 2 [IU] via SUBCUTANEOUS
  Administered 2020-03-01: 1 [IU] via SUBCUTANEOUS
  Administered 2020-03-01 – 2020-03-02 (×2): 2 [IU] via SUBCUTANEOUS
  Filled 2020-02-27: qty 0.06

## 2020-02-27 MED ORDER — ENOXAPARIN SODIUM 40 MG/0.4ML ~~LOC~~ SOLN
40.0000 mg | SUBCUTANEOUS | Status: DC
  Administered 2020-02-27 – 2020-03-02 (×5): 40 mg via SUBCUTANEOUS
  Filled 2020-02-27 (×5): qty 0.4

## 2020-02-27 MED ORDER — MOMETASONE FURO-FORMOTEROL FUM 200-5 MCG/ACT IN AERO
2.0000 | INHALATION_SPRAY | Freq: Two times a day (BID) | RESPIRATORY_TRACT | Status: DC
  Administered 2020-02-27 – 2020-03-02 (×7): 2 via RESPIRATORY_TRACT
  Filled 2020-02-27: qty 8.8

## 2020-02-27 MED ORDER — LEVETIRACETAM 500 MG PO TABS
500.0000 mg | ORAL_TABLET | Freq: Two times a day (BID) | ORAL | Status: DC
  Administered 2020-02-27 – 2020-03-02 (×9): 500 mg via ORAL
  Filled 2020-02-27 (×9): qty 1

## 2020-02-27 MED ORDER — PREDNISONE 50 MG PO TABS: 50.0000 mg | ORAL_TABLET | Freq: Every day | ORAL | Status: DC

## 2020-02-27 MED ORDER — SODIUM CHLORIDE 0.9 % IV SOLN: INTRAVENOUS | Status: AC

## 2020-02-27 MED ORDER — ONDANSETRON HCL 4 MG PO TABS: 4.0000 mg | ORAL_TABLET | Freq: Four times a day (QID) | ORAL | Status: DC | PRN

## 2020-02-27 MED ORDER — METHYLPREDNISOLONE SODIUM SUCC 125 MG IJ SOLR: 100.0000 mg | Freq: Two times a day (BID) | INTRAMUSCULAR | Status: DC

## 2020-02-27 MED ORDER — METHYLPREDNISOLONE SODIUM SUCC 125 MG IJ SOLR
100.0000 mg | Freq: Two times a day (BID) | INTRAMUSCULAR | Status: DC
  Administered 2020-02-27 – 2020-02-29 (×4): 100 mg via INTRAVENOUS
  Filled 2020-02-27 (×4): qty 2

## 2020-02-27 MED ORDER — ACETAMINOPHEN 325 MG PO TABS: 650.0000 mg | ORAL_TABLET | Freq: Four times a day (QID) | ORAL | Status: DC | PRN

## 2020-02-27 MED ORDER — SENNOSIDES-DOCUSATE SODIUM 8.6-50 MG PO TABS: 1.0000 | ORAL_TABLET | Freq: Every evening | ORAL | Status: DC | PRN

## 2020-02-27 MED ORDER — ALBUTEROL SULFATE HFA 108 (90 BASE) MCG/ACT IN AERS: 2.0000 | INHALATION_SPRAY | RESPIRATORY_TRACT | Status: DC | PRN

## 2020-02-27 MED ORDER — DEXAMETHASONE SODIUM PHOSPHATE 10 MG/ML IJ SOLN
10.0000 mg | Freq: Once | INTRAMUSCULAR | Status: AC
  Administered 2020-02-27: 10 mg via INTRAVENOUS
  Filled 2020-02-27: qty 1

## 2020-02-27 MED ORDER — HYDROCODONE-ACETAMINOPHEN 5-325 MG PO TABS: 1.0000 | ORAL_TABLET | ORAL | Status: DC | PRN

## 2020-02-27 MED ORDER — ONDANSETRON HCL 4 MG/2ML IJ SOLN: 4.0000 mg | Freq: Four times a day (QID) | INTRAMUSCULAR | Status: DC | PRN

## 2020-02-27 MED ORDER — SODIUM CHLORIDE 0.9 % IV SOLN
100.0000 mg | Freq: Every day | INTRAVENOUS | Status: AC
  Administered 2020-02-28 – 2020-03-02 (×4): 100 mg via INTRAVENOUS
  Filled 2020-02-27 (×5): qty 20

## 2020-02-27 MED ORDER — INSULIN ASPART 100 UNIT/ML ~~LOC~~ SOLN
0.0000 [IU] | Freq: Every day | SUBCUTANEOUS | Status: DC
  Administered 2020-02-28: 2 [IU] via SUBCUTANEOUS
  Administered 2020-02-29: 3 [IU] via SUBCUTANEOUS
  Filled 2020-02-27: qty 0.05

## 2020-02-27 NOTE — H&P (Signed)
History and Physical    [name] GDJ:[id number] DOB: [birth year] DOA: 02/26/2020  PCP: Garwin Brothers, MD   Patient coming from: SNF   Chief Complaint: Hypoxia, COVID+   HPI: [name] is a 70 y.o. male with medical history significant for CVA with hemiparesis, seizures, PAD, history of alcohol and substance abuse, COPD with chronic hypoxic respiratory failure, history of GI bleed on DAPT, dementia, and stage Ia non-small cell lung cancer involving the right upper lobe status post SBRT, now presenting to the emergency department from his SNF for evaluation of hypoxia.  History is limited by the patient's clinical condition.  Patient complains that he is thirsty, denies chest pain.  Patient unable to say when he was diagnosed with COVID.  Per ED documentation, patient was diagnosed with COVID-19 on 02/26/2019 and found to be saturating 47% on his usual 2 L/min of supplemental oxygen.  He was sent to the emergency department for this reason.  ED Course: Upon arrival to the ED, patient is found to be afebrile, saturating low 90s on 5 L/min of supplemental oxygen, tachypneic, and with stable blood pressure.  Chemistry panel notable for sodium 146, BUN 60, and creatinine 1.32, up from 0.5 in March.  CBC features a slight thrombocytopenia.  Chest x-ray notable for interstitial airspace opacities throughout the right lung that have worsened, as well as a left pleural effusion.  He was treated with Decadron in the ED.  Review of Systems:  Unable to complete ROS secondary to patient's clinical condition.  Past Medical History:  Diagnosis Date  . Acute blood loss anemia   . Acute CVA (cerebrovascular accident) ([place]) 08/27/2014  . Acute encephalopathy 11/28/2017  . Acute ischemic stroke ([place])   . AKI (acute kidney injury) ([place])   . Alcohol abuse    H/O  . Alcohol dependence with withdrawal with complication ([place]) 07/31/2977  . Anxiety   . Arterial ischemic stroke, MCA (middle cerebral  artery), right, acute ([place])   . Arthritis   . Asthma   . Binocular vision disorder with diplopia 01/03/2013  . CAD (coronary artery disease) 05/27/10   Cath: severe single vessell CAD left cx midportion obtuse marginal 2 to 3.  . Cerebral infarction due to embolism of right middle cerebral artery ([place])   . Cerebral infarction due to stenosis of right carotid artery ([place]) 11/01/2014  . Cerebral thrombosis with cerebral infarction 07/18/2016  . Cerebrovascular accident (CVA) due to stenosis of right carotid artery ([place])   . CHF (congestive heart failure) ([place])   . Closed blow-out fracture of floor of orbit ([place]) 01/19/2013  . COPD (chronic obstructive pulmonary disease) ([place])   . COPD (chronic obstructive pulmonary disease) ([place])   . CVA (cerebral vascular accident) ([place]) 08/26/2014  . Dementia due to another medical condition ([place]) 07/12/2015  . Depression   . Diabetes mellitus, type 2 ([place]) 03/13/2015  . Diastolic CHF, acute on chronic (HCC) 11/01/2014  . DOE (dyspnea on exertion) 11/23/2014  . DVT (deep venous thrombosis) ([place]) 02/22/2014  . Embolic stroke ([place])   . Enophthalmos due to trauma 01/19/2013  . Eyelid lesion 01/24/2013  . Fracture of inferior orbital wall ([place]) 01/03/2013  . Fracture of orbital floor ([place]) 01/03/2013  . GERD (gastroesophageal reflux disease)   . H/O total knee replacement 10/24/2014  . HCAP (healthcare-associated pneumonia) 10/15/2015  . Headache around the eyes 08/27/2014  . History of DVT (deep vein thrombosis)   . History of DVT of lower extremity   .  Hyperkalemia 10/24/2014  . Hyperlipemia   . Hyperlipidemia   . Hypertension   . Hypoalbuminemia due to protein-calorie malnutrition ([place])   . Hypotension   . Labile blood pressure   . Left shoulder pain 09/28/2017  . Lower extremity edema 02/21/2014  . Medial orbital wall fracture (HCC) 01/19/2013  . Myocardial infarction ([place]) 2000  . Orbital fracture ([place]) 12/2012  . OSA on CPAP   . Pain of left eye  01/03/2013  . Peripheral vascular disease, unspecified ([place])    08/20/10 doppler: increase in right ABI post-op. Left ABI stable. S/P bi-fem bypass surgery  . Pneumonia 04/04/2012  . Right middle cerebral artery stroke ([place]) 01/18/2018  . Seizures ([place])   . Shortness of breath   . Stroke (cerebrum) ([place]) 02/06/2015  . Thrombocytopenia ([place])   . Tobacco abuse   . Trichiasis without entropion 04/16/2013   Overview:  LLL central   . Type 2 diabetes mellitus with peripheral neuropathy ([place]) 03/13/2015  . Urinary incontinence 03/14/2015    Past Surgical History:  Procedure Laterality Date  . abdoninal ao angio & bifem angio  05/27/10   Patent graft, occluded bil stents with no retrograde flow into the hypogastric arteries. 100% occl left ant. tibial artery, 70% to 80% to 100% stenosis right superficial fem artery above adductor canal. 100 % occl right ant. tibial vessell  . Aortogram w/ PTCA  09/292003  . BACK SURGERY    . BIOPSY  08/26/2018   Procedure: BIOPSY;  Surgeon: Lavena Bullion, DO;  Location: [facility];  Service: Gastroenterology;;  . CARDIAC CATHETERIZATION  05/27/10   severe CAD left cx  . CHOLECYSTECTOMY    . ESOPHAGOGASTRIC FUNDOPLICATION    . ESOPHAGOGASTRODUODENOSCOPY (EGD) WITH PROPOFOL N/A 08/26/2018   Procedure: ESOPHAGOGASTRODUODENOSCOPY (EGD) WITH PROPOFOL;  Surgeon: Lavena Bullion, DO;  Location: [facility];  Service: Gastroenterology;  Laterality: N/A;  . EYE SURGERY    . FEMORAL BYPASS  08/19/10   Right Fem-Pop  . FLEXIBLE SIGMOIDOSCOPY N/A 08/26/2018   Procedure: FLEXIBLE SIGMOIDOSCOPY;  Surgeon: Lavena Bullion, DO;  Location: [facility];  Service: Gastroenterology;  Laterality: N/A;  . HOT HEMOSTASIS N/A 08/26/2018   Procedure: HOT HEMOSTASIS (ARGON PLASMA COAGULATION/BICAP);  Surgeon: Lavena Bullion, DO;  Location: [facility];  Service: Gastroenterology;  Laterality: N/A;  . IR ANGIO INTRA EXTRACRAN SEL COM CAROTID INNOMINATE BILAT MOD SED   07/20/2016  . IR ANGIO INTRA EXTRACRAN SEL COM CAROTID INNOMINATE BILAT MOD SED  01/14/2018  . IR ANGIO VERTEBRAL SEL SUBCLAVIAN INNOMINATE UNI R MOD SED  07/20/2016  . IR ANGIO VERTEBRAL SEL VERTEBRAL BILAT MOD SED  01/14/2018  . IR ANGIO VERTEBRAL SEL VERTEBRAL UNI L MOD SED  07/20/2016  . IR RADIOLOGIST EVAL & MGMT  05/27/2016  . IR US GUIDE VASC ACCESS RIGHT  01/14/2018  . JOINT REPLACEMENT    . PERIPHERAL VASCULAR CATHETERIZATION N/A 12/10/2014   Procedure: Abdominal Aortogram;  Surgeon: Angelia Mould, MD;  Location: [facility];  Service: Cardiovascular;  Laterality: N/A;  . POLYPECTOMY  08/26/2018   Procedure: POLYPECTOMY;  Surgeon: Lavena Bullion, DO;  Location: [facility];  Service: Gastroenterology;;  . RADIOLOGY WITH ANESTHESIA N/A 02/08/2015   Procedure: RADIOLOGY WITH ANESTHESIA;  Surgeon: Luanne Bras, MD;  Location: [facility];  Service: Radiology;  Laterality: N/A;  . TEE WITHOUT CARDIOVERSION N/A 08/29/2014   Procedure: TRANSESOPHAGEAL ECHOCARDIOGRAM (TEE);  Surgeon: Josue Hector, MD;  Location: [facility];  Service: Cardiovascular;  Laterality: N/A;  .  TOTAL KNEE ARTHROPLASTY      Social History:   reports that he has been smoking cigars and cigarettes. He started smoking about 59 years ago. He has a 25.00 pack-year smoking history. He has quit using smokeless tobacco.  His smokeless tobacco use included chew. He reports current alcohol use. He reports current drug use. Drug: Cocaine.  Allergies  Allergen Reactions  . Darvocet [Propoxyphene N-Acetaminophen] Hives  . Haldol [Haloperidol Decanoate] Hives  . Acetaminophen Nausea Only    Upset stomach, tolerates Hydrocodone/APAP if taken with food  . Metformin And Related Nausea And Vomiting  . Norco [Hydrocodone-Acetaminophen] Nausea And Vomiting    Tolerates if taken with food    Family History  Problem Relation Age of Onset  . Heart attack Mother   . Heart failure Mother   . Cirrhosis Father   .  Heart failure Brother   . Cancer Brother   . Hypertension Neg Hx        UNKNOWN  . Stroke Neg Hx        UNKNOWN     Prior to Admission medications   Medication Sig Start Date End Date Taking? Authorizing Provider  acetaminophen (TYLENOL) 325 MG tablet Take 2 tablets (650 mg total) by mouth every 4 (four) hours as needed for mild pain (or temp > 37.5 C (99.5 F)). Patient not taking: Reported on 10/24/2018 04/01/18   Daisy Floro, DO  allopurinol (ZYLOPRIM) 300 MG tablet Take 1 tablet (300 mg total) by mouth daily. 04/01/18   Daisy Floro, DO  atorvastatin (LIPITOR) 80 MG tablet Take 1 tablet (80 mg total) by mouth daily at 6 PM. Patient taking differently: Take 80 mg by mouth daily.  04/01/18   Daisy Floro, DO  camphor-menthol [facility]) lotion Apply topically as needed for itching. 04/01/18   Daisy Floro, DO  Cholecalciferol 25 MCG (1000 UT) tablet Take 1,000 Units by mouth daily.    [provider]  clopidogrel (PLAVIX) 75 MG tablet Take 1 tablet (75 mg total) by mouth daily. 04/01/18   Daisy Floro, DO  docusate sodium (COLACE) 100 MG capsule Take 100 mg by mouth daily.    [provider]  DULoxetine (CYMBALTA) 20 MG capsule Take 20 mg by mouth 2 (two) times daily. 04/20/19   [provider]  ferrous sulfate 325 (65 FE) MG tablet Take 1 tablet (325 mg total) by mouth daily with breakfast. 08/26/18   Carollee Leitz, MD  folic acid (FOLVITE) 1 MG tablet Take 1 tablet (1 mg total) by mouth daily. 04/01/18   Daisy Floro, DO  furosemide (LASIX) 40 MG tablet Take 40 mg by mouth daily. 04/21/19   [provider]  guaiFENesin (MUCINEX) 600 MG 12 hr tablet Take 1,200 mg by mouth 2 (two) times daily.    [provider]  hydrocortisone 2.5 % cream Apply 1 application topically every 6 (six) hours as needed (itchiness).    [provider]  Infant Care Products (DERMACLOUD) CREA Apply 1 application topically 2 (two) times  daily. Apply to buttocks    [provider]  ipratropium-albuterol (DUONEB) 0.5-2.5 (3) MG/3ML SOLN SMARTSIG:3 Milliliter(s) Via Nebulizer Every 6 Hours 04/17/19   [provider]  levETIRAcetam (KEPPRA) 500 MG tablet Take 1 tablet (500 mg total) by mouth 2 (two) times daily. 04/01/18   Daisy Floro, DO  losartan (COZAAR) 25 MG tablet Take 1 tablet (25 mg total) by mouth daily. 04/02/18   Daisy Floro,  DO  Melatonin 5 MG TABS Take 5 mg by mouth at bedtime.    [provider]  memantine (NAMENDA) 10 MG tablet Take 1 tablet (10 mg total) by mouth 2 (two) times daily. 04/01/18   Daisy Floro, DO  mirtazapine (REMERON) 7.5 MG tablet Take 7.5 mg by mouth at bedtime. 03/31/19   [provider]  mometasone-formoterol (DULERA) 200-5 MCG/ACT AERO Inhale 2 puffs into the lungs 2 (two) times daily. Patient not taking: Reported on 10/24/2018 04/01/18   Milus Banister C, DO  pantoprazole (PROTONIX) 40 MG tablet Take 1 tablet (40 mg total) by mouth daily. 04/01/18   Daisy Floro, DO  polyethylene glycol (MIRALAX / GLYCOLAX) 17 g packet Take 17 g by mouth daily as needed for moderate constipation.    [provider]  potassium chloride SA (KLOR-CON) 20 MEQ tablet Take 20 mEq by mouth daily. 04/08/19   [provider]  pregabalin (LYRICA) 150 MG capsule Take 1 capsule (150 mg total) by mouth 2 (two) times daily. 04/01/18   Daisy Floro, DO  Skin Protectants, Misc. (EUCERIN) cream Apply 1 application topically daily.    [provider]  tiZANidine (ZANAFLEX) 2 MG tablet Take 2 mg by mouth 3 (three) times daily.    [provider]  traZODone (DESYREL) 100 MG tablet Take 100 mg by mouth at bedtime.    [provider]  Grant Ruts INHUB 250-50 MCG/DOSE AEPB Inhale 1-2 puffs into the lungs daily. Patient taking differently: Inhale 2 puffs into the lungs daily.  04/01/18   Daisy Floro, DO    Physical Exam: Vitals:    02/27/20 0300 02/27/20 0330 02/27/20 0400 02/27/20 0530  BP: 113/67 (!) 124/106 124/71 130/65  Pulse: 93 92 92 92  Resp: (!) 28 (!) 22 (!) 30 (!) 28  Temp:      TempSrc:      SpO2: 90% (!) 88% 93% 92%    Constitutional: NAD, calm  Eyes: PERTLA, lids and conjunctivae normal ENMT: Mucous membranes are dry, white plaque on tongue. Torus palatinus.    Neck:  supple, no masses, no thyromegaly Respiratory: Tachypneic, no wheezing. No pallor or cyanosis.  Cardiovascular: S1 & S2 heard, regular rate and rhythm. LE pitting edema.  Abdomen: No distension, no tenderness, soft. Bowel sounds active.  Musculoskeletal: no clubbing / cyanosis. No joint deformity upper and lower extremities.   Skin: Seborrhea. Lower LLE wrapped. Warm, dry, well-perfused. Neurologic: Dysarthria. Hemiplegia.  Psychiatric: Alert oriented to person. Pleasant and cooperative.    Labs and Imaging on Admission: I have personally reviewed following labs and imaging studies  CBC: Recent Labs  Lab 02/27/20 0009  WBC 5.5  NEUTROABS 4.2  HGB 13.3  HCT 43.8  MCV 95.8  PLT 784*   Basic Metabolic Panel: Recent Labs  Lab 02/27/20 0009  NA 146*  K 4.3  CL 107  CO2 27  GLUCOSE 165*  BUN 60*  CREATININE 1.32*  CALCIUM 8.9   GFR: CrCl cannot be calculated (Unknown ideal weight.). Liver Function Tests: Recent Labs  Lab 02/27/20 0009  AST 40  ALT 20  ALKPHOS 59  BILITOT 0.9  PROT 7.0  ALBUMIN 3.5   No results for input(s): LIPASE, AMYLASE in the last 168 hours. No results for input(s): AMMONIA in the last 168 hours. Coagulation Profile: No results for input(s): INR, PROTIME in the last 168 hours. Cardiac Enzymes: No results for input(s): CKTOTAL, CKMB, CKMBINDEX, TROPONINI in the last 168 hours. BNP (last  3 results) No results for input(s): PROBNP in the last 8760 hours. HbA1C: No results for input(s): HGBA1C in the last 72 hours. CBG: No results for input(s): GLUCAP in the last 168 hours. Lipid  Profile: No results for input(s): CHOL, HDL, LDLCALC, TRIG, CHOLHDL, LDLDIRECT in the last 72 hours. Thyroid Function Tests: No results for input(s): TSH, T4TOTAL, FREET4, T3FREE, THYROIDAB in the last 72 hours. Anemia Panel: No results for input(s): VITAMINB12, FOLATE, FERRITIN, TIBC, IRON, RETICCTPCT in the last 72 hours. Urine analysis:    Component Value Date/Time   COLORURINE YELLOW 08/22/2018 1900   APPEARANCEUR HAZY (A) 08/22/2018 1900   LABSPEC 1.044 (H) 08/22/2018 1900   PHURINE 5.0 08/22/2018 1900   GLUCOSEU NEGATIVE 08/22/2018 1900   HGBUR NEGATIVE 08/22/2018 1900   BILIRUBINUR NEGATIVE 08/22/2018 1900   KETONESUR NEGATIVE 08/22/2018 1900   PROTEINUR 30 (A) 08/22/2018 1900   UROBILINOGEN 0.2 08/19/2014 2103   NITRITE NEGATIVE 08/22/2018 1900   LEUKOCYTESUR NEGATIVE 08/22/2018 1900   Sepsis Labs: @LABRCNTIP (procalcitonin:4,lacticidven:4) ) Recent Results (from the past 240 hour(s))  Resp Panel by RT-PCR (Flu A&B, Covid) Nasopharyngeal Swab     Status: Abnormal   Collection Time: 02/27/20 12:10 AM   Specimen: Nasopharyngeal Swab; Nasopharyngeal(NP) swabs in vial transport medium  Result Value Ref Range Status   SARS Coronavirus 2 by RT PCR POSITIVE (A) NEGATIVE Final    Comment: RESULT CALLED TO, READ BACK BY AND VERIFIED WITH: MULDOON S.1.18.22 @ 0236 BY MECIAL J. (NOTE) SARS-CoV-2 target nucleic acids are DETECTED.  The SARS-CoV-2 RNA is generally detectable in upper respiratory specimens during the acute phase of infection. Positive results are indicative of the presence of the identified virus, but do not rule out bacterial infection or co-infection with other pathogens not detected by the test. Clinical correlation with patient history and other diagnostic information is necessary to determine patient infection status. The expected result is Negative.  Fact Sheet for Patients: EntrepreneurPulse.com.au  Fact Sheet for Healthcare  Providers: IncredibleEmployment.be  This test is not yet approved or cleared by the [place] FDA and  has been authorized for detection and/or diagnosis of SARS-CoV-2 by FDA under an Emergency Use Authorization (EUA).  This EUA will remain in effect (meaning this test ca n be used) for the duration of  the COVID-19 declaration under Section 564(b)(1) of the Act, 21 U.S.C. section 360bbb-3(b)(1), unless the authorization is terminated or revoked sooner.     Influenza A by PCR NEGATIVE NEGATIVE Final   Influenza B by PCR NEGATIVE NEGATIVE Final    Comment: (NOTE) The Xpert Xpress SARS-CoV-2/FLU/RSV plus assay is intended as an aid in the diagnosis of influenza from Nasopharyngeal swab specimens and should not be used as a sole basis for treatment. Nasal washings and aspirates are unacceptable for Xpert Xpress SARS-CoV-2/FLU/RSV testing.  Fact Sheet for Patients: EntrepreneurPulse.com.au  Fact Sheet for Healthcare Providers: IncredibleEmployment.be  This test is not yet approved or cleared by the [place] FDA and has been authorized for detection and/or diagnosis of SARS-CoV-2 by FDA under an Emergency Use Authorization (EUA). This EUA will remain in effect (meaning this test can be used) for the duration of the COVID-19 declaration under Section 564(b)(1) of the Act, 21 U.S.C. section 360bbb-3(b)(1), unless the authorization is terminated or revoked.  Performed at [facility], [place] [address], [place], [place] [address]      Radiological Exams on Admission: DG Chest Portable 1 View  Result Date: 02/27/2020 CLINICAL DATA:  Hypoxia and cough  EXAM: PORTABLE CHEST 1 VIEW COMPARISON:  October 27, 2018 FINDINGS: The heart size and mediastinal contours are unchanged with mild cardiomegaly. Again noted is diffusely increased interstitial markings seen throughout the right lung which appear worsened  from the prior exam with patchy airspace opacity in the right mid lung. A small left effusion is seen. The visualized skeletal structures are unremarkable. IMPRESSION: Interstitial patchy airspace opacities throughout the right lung which appears worsened from the prior exam could be due to worsening chronic lung changes and/or superimposed pneumonia. Left pleural effusion Electronically Signed   By: Prudencio Pair M.D.   On: 02/27/2020 00:30    Assessment/Plan   1. COVID-19; acute on chronic respiratory failure  - Patient with COVID-19 and chronic 2 Lpm supplemental O2 requirement presents from SNF where he was reportedly saturating 47% on 2 Lpm  - Requiring 5 Lpm to maintain saturation of 92% and is mildly tachypneic at rest  - Started on Decadron in ED  - Continue systemic steroids, start remdesivir, continue supplemental O2, trend markers    2. COPD  - Does not appear to be in exacerbation  - Continue supplemental O2, inhalers   3. History of CVA  - Hx of CVA with hemiplegia  - He had GIB on DAPT, pharmacy medication-reconciliation currently pending    4. Lung cancer  - Stage 1A NSLC involving RUL s/p SBRT  - Per oncology notes from March 2021 was to have repeat CT in June 2021; will send a message to oncology    5. Hypertension  - BP at goal, hold ARB initially given increased creatinine    6. Chronic diastolic CHF  - EF was preserved in Feb 2020  - Has pitting LE edema but dry mucous membranes and increased creatinine  - Hold diuretic and ARB initially, monitor wt and I/Os   7. Seizure disorder  - Pharmacy medication-reconciliation pending, patient thinks he is still taking Keppra, will continue    8. Renal insufficiency  - SCr is 1.32 on admission, up from 0.5 in March 2021 - Likely AKI   - Hold Lasix and ARB initially and monitor   9. Left pleural effusion  - Appears fairly stable from CT in March   10. Hx of T2DM  - History of type II DM noted on problem list, A1c  was 5.7% in Feb 2020  - Pharmacy med-rec pending, will check CBGs and use a low-intensity SSI if needed    DVT prophylaxis: Lovenox  Code Status: DNR  Family Communication: Discussed with patient  Disposition Plan:  Patient is from: SNF  Anticipated d/c is to: SNF  Anticipated d/c date is: 03/01/20 Patient currently: Pending improvement in respiratory status  Consults called: None Admission status: Inpatient     Vianne Bulls, MD [facility]  02/27/2020, 5:52 AM

## 2020-02-27 NOTE — Progress Notes (Signed)
[facility] PROGRESS NOTE    Progress Note  [name]  BJS:[id number] DOB: September 18, 1950 DOA: 02/26/2020 PCP: Garwin Brothers, MD     Brief Narrative:   [name] is an 70 y.o. male past medical history significant for CVA with hemiparesis seizure disorder history of alcohol abuse and polysubstance abuse chronic respiratory failure with hypoxia GI bleed on dual antiplatelet therapy, dementia stage Ia non-small cell cancer involving the right upper lobe status post SBRT comes to the ED for hypoxia.  He was found to be satting 47% on his usual 2 L was placed on 6 and saturations improved.  Chemistry panel was unremarkable except for his creatinine which was 1.3 (baseline around 0.5 in March of last year) chest x-ray showed bilateral infiltrates SARS-CoV-2 PCR was positive  Assessment/Plan:   Acute respiratory disease due to COVID-19 virus: At baseline on 2 L now requiring 5L. He was started on IV remdesivir steroids and supplemental oxygen. Continue strict I's and O's and daily weights, try to keep the patient prone for at least 16 hours a day if not prone out of bed to chair. Currently on Lovenox for DVT prophylaxis. Markers are pending this morning.  Acute kidney injury: Likely prerenal azotemia in the setting of Lasix and ARB use with an ongoing infection. He was started on normal saline and recheck a basic metabolic panel tomorrow morning.  Hypovolemic hyponatremia: Will start her on normal saline and recheck basic metabolic panel this afternoon.  COPD (chronic obstructive pulmonary disease) ([place]) Does not appear to be in exacerbation no wheezing. Respiratory failure likely due to COVID-19.  Chronic diastolic congestive heart failure ([place]): Does not appear failure appears euvolemic on physical exam. But he is probably dry due to his rising creatinine. Still awaiting med rec to be done.  History of CVA: Had a GI bleed while on DAPT, awaiting pharmacy  reconciliation of meds.  Lung cancer stage Ia non-small cell involving the right upper lobe: Oncology has been notified he was supposed to have a schedule CT in June 2021 we will go ahead and repeated on this admission.  Essential hypertension: Blood pressure at goal he does have acute kidney injury, so we will hold antihypertensive medication especially his ARB and Lasix.  Seizure disorder: He takes Keppra at home will resume.  Left pleural effusion: Repeat CT scan.  Dementia due to another medical condition, without behavioral disturbance ([place]) At baseline can resume his home regimen once the med rec is done.  RN Pressure Injury Documentation: Pressure Injury 02/27/20 Sacrum Mid Stage 3 -  Full thickness tissue loss. Subcutaneous fat may be visible but bone, tendon or muscle are NOT exposed. (Active)  02/27/20 0408  Location: Sacrum  Location Orientation: Mid  Staging: Stage 3 -  Full thickness tissue loss. Subcutaneous fat may be visible but bone, tendon or muscle are NOT exposed.  Wound Description (Comments):   Present on Admission: Yes    Estimated body mass index is 29.43 kg/m as calculated from the following:   Height as of 04/26/19: 6' (1.829 m).   Weight as of 10/27/18: 98.4 kg.   DVT prophylaxis: lovenox Family Communication:none Status is: Inpatient  Remains inpatient appropriate because:Hemodynamically unstable   Dispo: The patient is from: SNF              Anticipated d/c is to: SNF              Anticipated d/c date is: > 3 days  Patient currently is not medically stable to d/c.        Code Status:     Code Status Orders  (From admission, onward)         Start     Ordered   02/27/20 0517  Do not attempt resuscitation (DNR)  Continuous       Question Answer Comment  In the event of cardiac or respiratory ARREST Do not call a "code blue"   In the event of cardiac or respiratory ARREST Do not perform Intubation, CPR, defibrillation  or ACLS   In the event of cardiac or respiratory ARREST Use medication by any route, position, wound care, and other measures to relive pain and suffering. May use oxygen, suction and manual treatment of airway obstruction as needed for comfort.      02/27/20 0518        Code Status History    Date Active Date Inactive Code Status Order ID Comments User Context   08/22/2018 2203 08/27/2018 0320 DNR [id number]  Rory Percy, DO Inpatient   03/25/2018 1908 04/01/2018 1942 DNR [id number]  Marjie Skiff, MD Inpatient   01/20/2018 1614 02/04/2018 2036 DNR [id number]  Cristi Loron, RN Inpatient   01/18/2018 1525 01/20/2018 1614 Full Code [id number]  Cathlyn Parsons, PA-C Inpatient   01/18/2018 1525 01/18/2018 1525 DNR [id number]  Cathlyn Parsons, PA-C Inpatient   01/13/2018 0448 01/18/2018 1508 DNR [id number]  Norval Morton, MD ED   11/28/2017 1815 12/02/2017 2143 DNR [id number]  Thurnell Lose, MD Inpatient   10/25/2017 2327 10/29/2017 2317 DNR [id number]  Ivor Costa, MD ED   10/25/2017 2304 10/25/2017 2327 Full Code [id number]  Ivor Costa, MD ED   07/17/2016 2233 07/21/2016 1932 DNR [id number]  Mercy Riding, MD Inpatient   10/15/2015 1724 10/16/2015 2008 Full Code [id number]  Norman Herrlich, MD ED   02/08/2015 1423 02/13/2015 2027 Full Code [id number]  Luanne Bras, MD Inpatient   02/06/2015 1754 02/08/2015 1423 Full Code [id number]  Radene Gunning, NP Inpatient   12/10/2014 1034 12/10/2014 1908 Full Code [id number]  Angelia Mould, MD Inpatient   08/26/2014 2217 08/29/2014 2041 DNR [id number]  Frazier Richards, MD Inpatient   08/19/2014 2319 08/20/2014 2227 DNR [id number]  Leone Brand, MD Inpatient   08/01/2014 2243 08/03/2014 2132 DNR [id number]  Mariel Aloe, MD Inpatient   06/18/2014 2322 06/20/2014 1416 Full Code [id number]  Lacretia Leigh, MD ED   04/10/2013 0336 04/12/2013 1909 DNR [id number]  Timmothy Euler, MD Inpatient   04/04/2012 1555 04/06/2012 1407 Full Code [id number]  Charlynne Cousins, MD Inpatient   Advance Care Planning Activity    Advance Directive Documentation   Flowsheet Row Most Recent Value  Type of Advance Directive Out of facility DNR (pink MOST or yellow form)  Pre-existing out of facility DNR order (yellow form or pink MOST form) --  "MOST" Form in Place? --        IV Access:    Peripheral IV   Procedures and diagnostic studies:   DG Chest Portable 1 View  Result Date: 02/27/2020 CLINICAL DATA:  Hypoxia and cough EXAM: PORTABLE CHEST 1 VIEW COMPARISON:  October 27, 2018 FINDINGS: The heart size and mediastinal contours are unchanged with mild cardiomegaly. Again noted is diffusely increased interstitial markings seen throughout the right lung which appear worsened from the prior exam with patchy airspace opacity in the right mid lung. A small left effusion is seen. The visualized skeletal  structures are unremarkable. IMPRESSION: Interstitial patchy airspace opacities throughout the right lung which appears worsened from the prior exam could be due to worsening chronic lung changes and/or superimposed pneumonia. Left pleural effusion Electronically Signed   By: Prudencio Pair M.D.   On: 02/27/2020 00:30     Medical Consultants:    None.  Anti-Infectives:   remdesivir  Subjective:    [name] he relates he is hungry and thirsty related shortness of breath is improved after increasing the oxygen.  Objective:    Vitals:   02/27/20 0330 02/27/20 0400 02/27/20 0530 02/27/20 0630  BP: (!) 124/106 124/71 130/65 (!) 101/56  Pulse: 92 92 92 93  Resp: (!) 22 (!) 30 (!) 28 (!) 23  Temp:      TempSrc:      SpO2: (!) 88% 93% 92% 93%   SpO2: 93 % O2 Flow Rate (L/min): 6 L/min  No intake or output data in the 24 hours ending 02/27/20 0646 There were no vitals filed for this visit.  Exam: General exam: In no acute distress. Respiratory system: Good air movement and clear to auscultation. Cardiovascular system: S1 & S2  heard, RRR. No JVD. Gastrointestinal system: Abdomen is nondistended, soft and nontender.  Extremities: No pedal edema. Skin: No rashes, lesions or ulcers Psychiatry: Judgement and insight appear normal. Mood & affect appropriate.    Data Reviewed:    Labs: Basic Metabolic Panel: Recent Labs  Lab 02/27/20 0009  NA 146*  K 4.3  CL 107  CO2 27  GLUCOSE 165*  BUN 60*  CREATININE 1.32*  CALCIUM 8.9   GFR CrCl cannot be calculated (Unknown ideal weight.). Liver Function Tests: Recent Labs  Lab 02/27/20 0009  AST 40  ALT 20  ALKPHOS 59  BILITOT 0.9  PROT 7.0  ALBUMIN 3.5   No results for input(s): LIPASE, AMYLASE in the last 168 hours. No results for input(s): AMMONIA in the last 168 hours. Coagulation profile No results for input(s): INR, PROTIME in the last 168 hours. COVID-19 Labs  No results for input(s): DDIMER, FERRITIN, LDH, CRP in the last 72 hours.  Lab Results  Component Value Date   SARSCOV2NAA POSITIVE (A) 02/27/2020   SARSCOV2NAA NEGATIVE 10/27/2018   [place] NEGATIVE 08/26/2018   [place] NEGATIVE 08/22/2018    CBC: Recent Labs  Lab 02/27/20 0009  WBC 5.5  NEUTROABS 4.2  HGB 13.3  HCT 43.8  MCV 95.8  PLT 142*   Cardiac Enzymes: No results for input(s): CKTOTAL, CKMB, CKMBINDEX, TROPONINI in the last 168 hours. BNP (last 3 results) No results for input(s): PROBNP in the last 8760 hours. CBG: No results for input(s): GLUCAP in the last 168 hours. D-Dimer: No results for input(s): DDIMER in the last 72 hours. Hgb A1c: No results for input(s): HGBA1C in the last 72 hours. Lipid Profile: No results for input(s): CHOL, HDL, LDLCALC, TRIG, CHOLHDL, LDLDIRECT in the last 72 hours. Thyroid function studies: No results for input(s): TSH, T4TOTAL, T3FREE, THYROIDAB in the last 72 hours.  Invalid input(s): FREET3 Anemia work up: No results for input(s): VITAMINB12, FOLATE, FERRITIN, TIBC, IRON, RETICCTPCT in the last 72  hours. Sepsis Labs: Recent Labs  Lab 02/27/20 0009  WBC 5.5   Microbiology Recent Results (from the past 240 hour(s))  Resp Panel by RT-PCR (Flu A&B, Covid) Nasopharyngeal Swab     Status: Abnormal   Collection Time: 02/27/20 12:10 AM   Specimen: Nasopharyngeal Swab; Nasopharyngeal(NP) swabs in vial transport medium  Result Value  Ref Range Status   SARS Coronavirus 2 by RT PCR POSITIVE (A) NEGATIVE Final    Comment: RESULT CALLED TO, READ BACK BY AND VERIFIED WITH: MULDOON S.1.18.22 @ 0236 BY MECIAL J. (NOTE) SARS-CoV-2 target nucleic acids are DETECTED.  The SARS-CoV-2 RNA is generally detectable in upper respiratory specimens during the acute phase of infection. Positive results are indicative of the presence of the identified virus, but do not rule out bacterial infection or co-infection with other pathogens not detected by the test. Clinical correlation with patient history and other diagnostic information is necessary to determine patient infection status. The expected result is Negative.  Fact Sheet for Patients: EntrepreneurPulse.com.au  Fact Sheet for Healthcare Providers: IncredibleEmployment.be  This test is not yet approved or cleared by the [place] FDA and  has been authorized for detection and/or diagnosis of SARS-CoV-2 by FDA under an Emergency Use Authorization (EUA).  This EUA will remain in effect (meaning this test ca n be used) for the duration of  the COVID-19 declaration under Section 564(b)(1) of the Act, 21 U.S.C. section 360bbb-3(b)(1), unless the authorization is terminated or revoked sooner.     Influenza A by PCR NEGATIVE NEGATIVE Final   Influenza B by PCR NEGATIVE NEGATIVE Final    Comment: (NOTE) The Xpert Xpress SARS-CoV-2/FLU/RSV plus assay is intended as an aid in the diagnosis of influenza from Nasopharyngeal swab specimens and should not be used as a sole basis for treatment. Nasal washings  and aspirates are unacceptable for Xpert Xpress SARS-CoV-2/FLU/RSV testing.  Fact Sheet for Patients: EntrepreneurPulse.com.au  Fact Sheet for Healthcare Providers: IncredibleEmployment.be  This test is not yet approved or cleared by the [place] FDA and has been authorized for detection and/or diagnosis of SARS-CoV-2 by FDA under an Emergency Use Authorization (EUA). This EUA will remain in effect (meaning this test can be used) for the duration of the COVID-19 declaration under Section 564(b)(1) of the Act, 21 U.S.C. section 360bbb-3(b)(1), unless the authorization is terminated or revoked.  Performed at [facility], [place] [address], [place], [place] [address]      Medications:   . enoxaparin (LOVENOX) injection  40 mg Subcutaneous Q24H  . insulin aspart  0-5 Units Subcutaneous QHS  . insulin aspart  0-6 Units Subcutaneous TID WC  . levETIRAcetam  500 mg Oral BID  . methylPREDNISolone (SOLU-MEDROL) injection  100 mg Intravenous Q12H   Followed by  . [START ON 03/02/2020] predniSONE  50 mg Oral Daily  . mometasone-formoterol  2 puff Inhalation BID   Continuous Infusions: . remdesivir 200 mg in sodium chloride 0.9% 250 mL IVPB 200 mg (02/27/20 8882)   Followed by  . [START ON 02/28/2020] remdesivir 100 mg in NS 100 mL        LOS: 0 days   Charlynne Cousins  [facility]  02/27/2020, 6:46 AM

## 2020-02-27 NOTE — ED Notes (Signed)
Pt diaper and sheets changed at this time. Pt was able to roll with assistance. Pt cleaned with soap and water. Sacral bandage replaced after cleaning up pt. Pt tolerated cleaning well.

## 2020-02-28 ENCOUNTER — Other Ambulatory Visit: Payer: Self-pay

## 2020-02-28 DIAGNOSIS — I2510 Atherosclerotic heart disease of native coronary artery without angina pectoris: Secondary | ICD-10-CM

## 2020-02-28 LAB — CBC WITH DIFFERENTIAL/PLATELET
Abs Immature Granulocytes: 0.04 10*3/uL (ref 0.00–0.07)
Basophils Absolute: 0 10*3/uL (ref 0.0–0.1)
Basophils Relative: 0 %
Eosinophils Absolute: 0 10*3/uL (ref 0.0–0.5)
Eosinophils Relative: 0 %
HCT: 41.2 % (ref 39.0–52.0)
Hemoglobin: 12.7 g/dL — ABNORMAL LOW (ref 13.0–17.0)
Immature Granulocytes: 1 %
Lymphocytes Relative: 11 %
Lymphs Abs: 0.5 10*3/uL — ABNORMAL LOW (ref 0.7–4.0)
MCH: 28.7 pg (ref 26.0–34.0)
MCHC: 30.8 g/dL (ref 30.0–36.0)
MCV: 93.2 fL (ref 80.0–100.0)
Monocytes Absolute: 0.5 10*3/uL (ref 0.1–1.0)
Monocytes Relative: 11 %
Neutro Abs: 3.6 10*3/uL (ref 1.7–7.7)
Neutrophils Relative %: 77 %
Platelets: 131 10*3/uL — ABNORMAL LOW (ref 150–400)
RBC: 4.42 MIL/uL (ref 4.22–5.81)
RDW: 15 % (ref 11.5–15.5)
WBC: 4.6 10*3/uL (ref 4.0–10.5)
nRBC: 0 % (ref 0.0–0.2)

## 2020-02-28 LAB — COMPREHENSIVE METABOLIC PANEL
ALT: 19 U/L (ref 0–44)
AST: 29 U/L (ref 15–41)
Albumin: 3.1 g/dL — ABNORMAL LOW (ref 3.5–5.0)
Alkaline Phosphatase: 52 U/L (ref 38–126)
Anion gap: 10 (ref 5–15)
BUN: 48 mg/dL — ABNORMAL HIGH (ref 8–23)
CO2: 26 mmol/L (ref 22–32)
Calcium: 9.1 mg/dL (ref 8.9–10.3)
Chloride: 111 mmol/L (ref 98–111)
Creatinine, Ser: 0.68 mg/dL (ref 0.61–1.24)
GFR, Estimated: >60 mL/min (ref >60)
Glucose, Bld: 166 mg/dL — ABNORMAL HIGH (ref 70–99)
Potassium: 3 mmol/L — ABNORMAL LOW (ref 3.5–5.1)
Sodium: 147 mmol/L — ABNORMAL HIGH (ref 135–145)
Total Bilirubin: 0.6 mg/dL (ref 0.3–1.2)
Total Protein: 6.2 g/dL — ABNORMAL LOW (ref 6.5–8.1)

## 2020-02-28 LAB — GLUCOSE, CAPILLARY
Glucose-Capillary: 162 mg/dL — ABNORMAL HIGH (ref 70–99)
Glucose-Capillary: 207 mg/dL — ABNORMAL HIGH (ref 70–99)
Glucose-Capillary: 136 mg/dL — ABNORMAL HIGH (ref 70–99)
Glucose-Capillary: 168 mg/dL — ABNORMAL HIGH (ref 70–99)
Glucose-Capillary: 211 mg/dL — ABNORMAL HIGH (ref 70–99)

## 2020-02-28 LAB — MAGNESIUM: Magnesium: 2.4 mg/dL (ref 1.7–2.4)

## 2020-02-28 LAB — HIV ANTIBODY (ROUTINE TESTING W REFLEX): HIV Screen 4th Generation wRfx: NONREACTIVE

## 2020-02-28 LAB — D-DIMER, QUANTITATIVE: D-Dimer, Quant: 1.55 ug/mL-FEU — ABNORMAL HIGH (ref 0.00–0.50)

## 2020-02-28 LAB — PHOSPHORUS: Phosphorus: 2.9 mg/dL (ref 2.5–4.6)

## 2020-02-28 LAB — HEMOGLOBIN A1C
Hgb A1c MFr Bld: 5.6 % (ref 4.8–5.6)
Mean Plasma Glucose: 114.02 mg/dL

## 2020-02-28 LAB — C-REACTIVE PROTEIN: CRP: 7.1 mg/dL — ABNORMAL HIGH (ref <1.0)

## 2020-02-28 MED ORDER — POTASSIUM CHLORIDE CRYS ER 20 MEQ PO TBCR
40.0000 meq | EXTENDED_RELEASE_TABLET | Freq: Once | ORAL | Status: AC
  Administered 2020-02-28: 40 meq via ORAL
  Filled 2020-02-28: qty 2

## 2020-02-28 NOTE — Progress Notes (Signed)
PROGRESS NOTE  [name]  WGN:[id number] DOB: 10-31-1950 DOA: 02/26/2020 PCP: Garwin Brothers, MD   Brief Narrative: [name] is a 70 y.o. male with a history of CVA with hemiparesis, seizure disorder, polysubstance and alcohol abuse, COPD on 2L O2 at baseline, dementia, NSCLC s/p SBRT who presented from SNF due to hypoxia, found to have positive SARS-CoV-2 testing and bilateral infiltrates on CXR. SCr up to 1.3 from baseline around 0.5 as well. Remdesivir and steroids provided and the patient admitted requiring 6L O2.   Assessment & Plan: Principal Problem:   Acute respiratory disease due to COVID-19 virus Active Problems:   COPD (chronic obstructive pulmonary disease) (HCC)   Chronic diastolic congestive heart failure (HCC)   History of DVT (deep vein thrombosis)   Essential hypertension   History of stroke   Coronary artery disease involving native coronary artery of native heart without angina pectoris   Dementia due to another medical condition, without behavioral disturbance ([place])   Adenocarcinoma of right lung, stage 1 (HCC)   Pressure injury of skin   Acute on chronic respiratory failure with hypoxia (HCC)   Pleural effusion, left   Renal insufficiency  Acute on chronic hypoxic respiratory failure due to covid-19 pneumonia on COPD without exacerbation:  - Continue remdesivir, steroids - Wean oxygen back to baseline as tolerated.  - Lovenox, proning, IS, FV, monitor CMP and inflammatory markers. CRP remains elevated. If hypoxia not improving, or worsens to HFNC would give immunomodulators. Bronchopneumonia suggested on CT. WBC normal, afebrile. Will check PCT (was 0.18).  - Continue bronchodilators  AKI: Continues improvement. - Hold lasix, ARB.  Hypovolemic hypernatremia:  - Monitoring, push free water  Hypokalemia:  - Supplement  Chronic HFpEF:  - Monitor volume status  Lung cancer stage Ia non-small cell involving the right upper lobe: Oncology has  been notified he was supposed to have a schedule CT in June 2021 we will go ahead and repeated on this admission.  Thrombocytopenia, lymphopenia: Likely due to covid - Monitor  Essential hypertension: - Hold ARB and Lasix.  Seizure disorder: - Continue keppra.  Left pleural effusion: Repeat CT scan showing this with atelectasis.   Dementia due to another medical condition, without behavioral disturbance ([name]) At baseline can resume his home regimen once the med rec is done.  CAD, aortic atherosclerosis: Noted.  RN Pressure Injury Documentation: Pressure Injury 02/27/20 Sacrum Mid Stage 3 -  Full thickness tissue loss. Subcutaneous fat may be visible but bone, tendon or muscle are NOT exposed. (Active)  02/27/20 0408  Location: Sacrum  Location Orientation: Mid  Staging: Stage 3 -  Full thickness tissue loss. Subcutaneous fat may be visible but bone, tendon or muscle are NOT exposed.  Wound Description (Comments):   Present on Admission: Yes   Obesity: Estimated body mass index is 30.31 kg/m as calculated from the following:   Height as of this encounter: 6\' 1"  (1.854 m).   Weight as of this encounter: 104.2 kg.  DVT prophylaxis: Lovenox Code Status: DNR Family Communication: None Disposition Plan:  Status is: Inpatient  Remains inpatient appropriate because:IV treatments appropriate due to intensity of illness or inability to take PO   Dispo: The patient is from: SNF              Anticipated d/c is to: SNF              Anticipated d/c date is: 2 days  Patient currently is not medically stable to d/c.  Consultants:   None  Procedures:   None  Antimicrobials:  Remdesivir   Subjective: No chest pain or dyspnea at time of interview. Feels somewhat weak, moderately, constantly. Better than at presentation.   Objective: Vitals:   02/28/20 0341 02/28/20 0500 02/28/20 1054 02/28/20 1419  BP: (!) 141/72  121/77 115/67  Pulse: 91  88 84  Resp:  18  20 18   Temp: 98.5 F (36.9 C)  97.9 F (36.6 C) 97.8 F (36.6 C)  TempSrc:   Oral Oral  SpO2: 94%  (!) 77% 91%  Weight:  104.2 kg    Height:        Intake/Output Summary (Last 24 hours) at 02/28/2020 1630 Last data filed at 02/28/2020 1200 Gross per 24 hour  Intake 795 ml  Output 1175 ml  Net -380 ml   Filed Weights   02/28/20 0023 02/28/20 0500  Weight: 104.2 kg 104.2 kg   Gen: 70 y.o. male in no distress Pulm: Non-labored breathing supplemental oxygen, diminished at bases. Crackles bilaterally.  CV: Regular rate and rhythm. No murmur, rub, or gallop. No JVD, no pitting pedal edema. GI: Abdomen soft, non-tender, non-distended, with normoactive bowel sounds. No organomegaly or masses felt. Ext: Warm, no deformities Skin: No rashes, lesions or ulcers Neuro: Alert and incompletely oriented. Left hemiparesis noted. Psych: Judgement and insight appear normal. Mood & affect appropriate.   Data Reviewed: I have personally reviewed following labs and imaging studies  CBC: Recent Labs  Lab 02/27/20 0009 02/28/20 0433  WBC 5.5 4.6  NEUTROABS 4.2 3.6  HGB 13.3 12.7*  HCT 43.8 41.2  MCV 95.8 93.2  PLT 142* 951*   Basic Metabolic Panel: Recent Labs  Lab 02/27/20 0009 02/28/20 0433  NA 146* 147*  K 4.3 3.0*  CL 107 111  CO2 27 26  GLUCOSE 165* 166*  BUN 60* 48*  CREATININE 1.32* 0.68  CALCIUM 8.9 9.1  MG  --  2.4  PHOS  --  2.9   GFR: Estimated Creatinine Clearance: 108.9 mL/min (by C-G formula based on SCr of 0.68 mg/dL). Liver Function Tests: Recent Labs  Lab 02/27/20 0009 02/28/20 0433  AST 40 29  ALT 20 19  ALKPHOS 59 52  BILITOT 0.9 0.6  PROT 7.0 6.2*  ALBUMIN 3.5 3.1*   No results for input(s): LIPASE, AMYLASE in the last 168 hours. No results for input(s): AMMONIA in the last 168 hours. Coagulation Profile: No results for input(s): INR, PROTIME in the last 168 hours. Cardiac Enzymes: No results for input(s): CKTOTAL, CKMB, CKMBINDEX,  TROPONINI in the last 168 hours. BNP (last 3 results) No results for input(s): PROBNP in the last 8760 hours. HbA1C: Recent Labs    02/28/20 0433  HGBA1C 5.6   CBG: Recent Labs  Lab 02/27/20 0838 02/27/20 1734 02/27/20 2210 02/28/20 0801 02/28/20 1141  GLUCAP 148* 164* 148* 162* 207*   Lipid Profile: No results for input(s): CHOL, HDL, LDLCALC, TRIG, CHOLHDL, LDLDIRECT in the last 72 hours. Thyroid Function Tests: No results for input(s): TSH, T4TOTAL, FREET4, T3FREE, THYROIDAB in the last 72 hours. Anemia Panel: Recent Labs    02/27/20 0633  FERRITIN 124   Urine analysis:    Component Value Date/Time   COLORURINE YELLOW 08/22/2018 1900   APPEARANCEUR HAZY (A) 08/22/2018 1900   LABSPEC 1.044 (H) 08/22/2018 1900   PHURINE 5.0 08/22/2018 1900   GLUCOSEU NEGATIVE 08/22/2018 1900   HGBUR NEGATIVE 08/22/2018 1900  BILIRUBINUR NEGATIVE 08/22/2018 1900   KETONESUR NEGATIVE 08/22/2018 1900   PROTEINUR 30 (A) 08/22/2018 1900   UROBILINOGEN 0.2 08/19/2014 2103   NITRITE NEGATIVE 08/22/2018 1900   LEUKOCYTESUR NEGATIVE 08/22/2018 1900   Recent Results (from the past 240 hour(s))  Resp Panel by RT-PCR (Flu A&B, Covid) Nasopharyngeal Swab     Status: Abnormal   Collection Time: 02/27/20 12:10 AM   Specimen: Nasopharyngeal Swab; Nasopharyngeal(NP) swabs in vial transport medium  Result Value Ref Range Status   SARS Coronavirus 2 by RT PCR POSITIVE (A) NEGATIVE Final    Comment: RESULT CALLED TO, READ BACK BY AND VERIFIED WITH: MULDOON S.1.18.22 @ 0236 BY MECIAL J. (NOTE) SARS-CoV-2 target nucleic acids are DETECTED.  The SARS-CoV-2 RNA is generally detectable in upper respiratory specimens during the acute phase of infection. Positive results are indicative of the presence of the identified virus, but do not rule out bacterial infection or co-infection with other pathogens not detected by the test. Clinical correlation with patient history and other diagnostic  information is necessary to determine patient infection status. The expected result is Negative.  Fact Sheet for Patients: EntrepreneurPulse.com.au  Fact Sheet for Healthcare Providers: IncredibleEmployment.be  This test is not yet approved or cleared by the [place] FDA and  has been authorized for detection and/or diagnosis of SARS-CoV-2 by FDA under an Emergency Use Authorization (EUA).  This EUA will remain in effect (meaning this test ca n be used) for the duration of  the COVID-19 declaration under Section 564(b)(1) of the Act, 21 U.S.C. section 360bbb-3(b)(1), unless the authorization is terminated or revoked sooner.     Influenza A by PCR NEGATIVE NEGATIVE Final   Influenza B by PCR NEGATIVE NEGATIVE Final    Comment: (NOTE) The Xpert Xpress SARS-CoV-2/FLU/RSV plus assay is intended as an aid in the diagnosis of influenza from Nasopharyngeal swab specimens and should not be used as a sole basis for treatment. Nasal washings and aspirates are unacceptable for Xpert Xpress SARS-CoV-2/FLU/RSV testing.  Fact Sheet for Patients: EntrepreneurPulse.com.au  Fact Sheet for Healthcare Providers: IncredibleEmployment.be  This test is not yet approved or cleared by the [place] FDA and has been authorized for detection and/or diagnosis of SARS-CoV-2 by FDA under an Emergency Use Authorization (EUA). This EUA will remain in effect (meaning this test can be used) for the duration of the COVID-19 declaration under Section 564(b)(1) of the Act, 21 U.S.C. section 360bbb-3(b)(1), unless the authorization is terminated or revoked.  Performed at [facility], [place] [address], [place], [place] [address]       Radiology Studies: CT CHEST WO CONTRAST  Result Date: 02/27/2020 CLINICAL DATA:  70 year old male with history of dyspnea. Chronic chest wall or pleural disease suspected. EXAM:  CT CHEST WITHOUT CONTRAST TECHNIQUE: Multidetector CT imaging of the chest was performed following the standard protocol without IV contrast. COMPARISON:  Chest CT 04/20/2019. FINDINGS: Cardiovascular: Heart size is normal. Trace amount of pericardial fluid and/or thickening most evident adjacent to the left ventricle, decreased compared to the prior study, and unlikely to be of hemodynamic significance at this time. No associated pericardial calcification. There is aortic atherosclerosis, as well as atherosclerosis of the great vessels of the mediastinum and the coronary arteries, including calcified atherosclerotic plaque in the left main, left anterior descending, left circumflex and right coronary arteries. Calcifications of the aortic valve. Mediastinum/Nodes: No pathologically enlarged mediastinal or hilar lymph nodes. Esophagus is unremarkable in appearance. No axillary lymphadenopathy. Lungs/Pleura: Small left pleural effusion  lying dependently. Patchy areas of airspace consolidation throughout the right upper and right lower lobes. Areas of atelectasis are also noted in the lower lobes of the lungs bilaterally, with near complete atelectasis of the left lower lobe. No definite suspicious appearing pulmonary nodules or masses are noted. Upper Abdomen: Surgical clips adjacent to the gastroesophageal junction. Diffuse low attenuation throughout the visualized hepatic parenchyma, indicative of a background of hepatic steatosis. Status post cholecystectomy. Musculoskeletal: There are no aggressive appearing lytic or blastic lesions noted in the visualized portions of the skeleton. IMPRESSION: 1. Multilobar bronchopneumonia in the right lung most evident in the right upper lobe and right lower lobe. 2. Small chronic left pleural effusion lying dependently with near complete atelectasis of the left lower lobe, and some subsegmental atelectasis in the right lower lobe. 3. Aortic atherosclerosis, in addition to left  main and 3 vessel coronary artery disease. Please note that although the presence of coronary artery calcium documents the presence of coronary artery disease, the severity of this disease and any potential stenosis cannot be assessed on this non-gated CT examination. Assessment for potential risk factor modification, dietary therapy or pharmacologic therapy may be warranted, if clinically indicated. 4. There are calcifications of the aortic valve. Echocardiographic correlation for evaluation of potential valvular dysfunction may be warranted if clinically indicated. 5. Hepatic steatosis. Aortic Atherosclerosis (ICD10-I70.0) and Emphysema (ICD10-J43.9). Electronically Signed   By: Vinnie Langton M.D.   On: 02/27/2020 09:38   DG Chest Portable 1 View  Result Date: 02/27/2020 CLINICAL DATA:  Hypoxia and cough EXAM: PORTABLE CHEST 1 VIEW COMPARISON:  October 27, 2018 FINDINGS: The heart size and mediastinal contours are unchanged with mild cardiomegaly. Again noted is diffusely increased interstitial markings seen throughout the right lung which appear worsened from the prior exam with patchy airspace opacity in the right mid lung. A small left effusion is seen. The visualized skeletal structures are unremarkable. IMPRESSION: Interstitial patchy airspace opacities throughout the right lung which appears worsened from the prior exam could be due to worsening chronic lung changes and/or superimposed pneumonia. Left pleural effusion Electronically Signed   By: Prudencio Pair M.D.   On: 02/27/2020 00:30    Scheduled Meds: . enoxaparin (LOVENOX) injection  40 mg Subcutaneous Q24H  . insulin aspart  0-5 Units Subcutaneous QHS  . insulin aspart  0-6 Units Subcutaneous TID WC  . levETIRAcetam  500 mg Oral BID  . methylPREDNISolone (SOLU-MEDROL) injection  100 mg Intravenous Q12H  . mometasone-formoterol  2 puff Inhalation BID   Continuous Infusions: . remdesivir 100 mg in NS 100 mL 100 mg (02/28/20 1254)      LOS: 1 day   Time spent: 25 minutes.  Patrecia Pour, MD [facility] [email or web address] 02/28/2020, 4:30 PM

## 2020-02-29 LAB — COMPREHENSIVE METABOLIC PANEL
ALT: 18 U/L (ref 0–44)
AST: 33 U/L (ref 15–41)
Albumin: 3.2 g/dL — ABNORMAL LOW (ref 3.5–5.0)
Alkaline Phosphatase: 50 U/L (ref 38–126)
Anion gap: 12 (ref 5–15)
BUN: 48 mg/dL — ABNORMAL HIGH (ref 8–23)
CO2: 23 mmol/L (ref 22–32)
Calcium: 9.4 mg/dL (ref 8.9–10.3)
Chloride: 113 mmol/L — ABNORMAL HIGH (ref 98–111)
Creatinine, Ser: 0.76 mg/dL (ref 0.61–1.24)
GFR, Estimated: >60 mL/min (ref >60)
Glucose, Bld: 166 mg/dL — ABNORMAL HIGH (ref 70–99)
Potassium: 3.4 mmol/L — ABNORMAL LOW (ref 3.5–5.1)
Sodium: 148 mmol/L — ABNORMAL HIGH (ref 135–145)
Total Bilirubin: 0.6 mg/dL (ref 0.3–1.2)
Total Protein: 6.2 g/dL — ABNORMAL LOW (ref 6.5–8.1)

## 2020-02-29 LAB — CBC WITH DIFFERENTIAL/PLATELET
Abs Immature Granulocytes: 0.02 10*3/uL (ref 0.00–0.07)
Basophils Absolute: 0 10*3/uL (ref 0.0–0.1)
Basophils Relative: 0 %
Eosinophils Absolute: 0 10*3/uL (ref 0.0–0.5)
Eosinophils Relative: 0 %
HCT: 40.4 % (ref 39.0–52.0)
Hemoglobin: 12.6 g/dL — ABNORMAL LOW (ref 13.0–17.0)
Immature Granulocytes: 0 %
Lymphocytes Relative: 8 %
Lymphs Abs: 0.4 10*3/uL — ABNORMAL LOW (ref 0.7–4.0)
MCH: 28.8 pg (ref 26.0–34.0)
MCHC: 31.2 g/dL (ref 30.0–36.0)
MCV: 92.2 fL (ref 80.0–100.0)
Monocytes Absolute: 0.4 10*3/uL (ref 0.1–1.0)
Monocytes Relative: 8 %
Neutro Abs: 3.9 10*3/uL (ref 1.7–7.7)
Neutrophils Relative %: 84 %
Platelets: 154 10*3/uL (ref 150–400)
RBC: 4.38 MIL/uL (ref 4.22–5.81)
RDW: 15.1 % (ref 11.5–15.5)
WBC: 4.7 10*3/uL (ref 4.0–10.5)
nRBC: 0 % (ref 0.0–0.2)

## 2020-02-29 LAB — GLUCOSE, CAPILLARY
Glucose-Capillary: 164 mg/dL — ABNORMAL HIGH (ref 70–99)
Glucose-Capillary: 227 mg/dL — ABNORMAL HIGH (ref 70–99)
Glucose-Capillary: 134 mg/dL — ABNORMAL HIGH (ref 70–99)
Glucose-Capillary: 255 mg/dL — ABNORMAL HIGH (ref 70–99)

## 2020-02-29 LAB — PROCALCITONIN: Procalcitonin: 0.11 ng/mL

## 2020-02-29 LAB — D-DIMER, QUANTITATIVE: D-Dimer, Quant: 1.23 ug/mL-FEU — ABNORMAL HIGH (ref 0.00–0.50)

## 2020-02-29 LAB — C-REACTIVE PROTEIN: CRP: 3.2 mg/dL — ABNORMAL HIGH (ref <1.0)

## 2020-02-29 MED ORDER — METHYLPREDNISOLONE SODIUM SUCC 125 MG IJ SOLR
60.0000 mg | Freq: Two times a day (BID) | INTRAMUSCULAR | Status: DC
  Administered 2020-02-29 – 2020-03-02 (×4): 60 mg via INTRAVENOUS
  Filled 2020-02-29 (×4): qty 2

## 2020-02-29 MED ORDER — POTASSIUM CHLORIDE CRYS ER 20 MEQ PO TBCR
20.0000 meq | EXTENDED_RELEASE_TABLET | Freq: Once | ORAL | Status: AC
  Administered 2020-02-29: 20 meq via ORAL
  Filled 2020-02-29: qty 1

## 2020-02-29 NOTE — Evaluation (Signed)
Physical Therapy Evaluation Patient Details Name: [name] MRN: [id number] DOB: [birth year] Today's Date: 02/29/2020   History of Present Illness  70 y.o. male with a history of CVA with hemiparesis, seizure disorder, polysubstance and alcohol abuse, COPD on 2L O2 at baseline, dementia, NSCLC s/p SBRT who presented from SNF due to hypoxia, found to have positive SARS-CoV-2 testing and bilateral infiltrates on CXR. SCr up to 1.3 from baseline around 0.5 as well. Remdesivir and steroids provided and the patient admitted requiring 6L O2.  Clinical Impression  Pt admitted with above diagnosis.  Pt currently with functional limitations due to the deficits listed below (see PT Problem List). Pt will benefit from skilled PT to increase their independence and safety with mobility to allow discharge to the venue listed below.  Pt requiring significant assist for bed mobility and likely would require +2 and/or lift for OOB.  Pt poor historian however admitted from SNF.  Facility likely using lift for OOB.  Will see on a trial basis.     Follow Up Recommendations SNF    Equipment Recommendations  None recommended by PT    Recommendations for Other Services       Precautions / Restrictions Precautions Precautions: Fall Precaution Comments: monitor sats, L UE hemiplegia, L LE hemiparesis      Mobility  Bed Mobility Overal bed mobility: Needs Assistance Bed Mobility: Supine to Sit;Sit to Supine     Supine to sit: Total assist Sit to supine: Total assist   General bed mobility comments: attempted with +1 and unable to completely assist pt to EOB, pt only able to assist with R UE    Transfers                 General transfer comment: lift at facility?  Ambulation/Gait                Stairs            Wheelchair Mobility    Modified Rankin (Stroke Patients Only)       Balance Overall balance assessment: Needs assistance   Sitting balance-Leahy Scale:  Zero Sitting balance - Comments: unable to even sit upright in bed                                     Pertinent Vitals/Pain Pain Assessment: No/denies pain    Home Living Family/patient expects to be discharged to:: Skilled nursing facility                      Prior Function Level of Independence: Needs assistance         Comments: Pt poor historian, appears to require assist even possibly hoyer lift for OOB     Hand Dominance        Extremity/Trunk Assessment   Upper Extremity Assessment Upper Extremity Assessment: LUE deficits/detail;RUE deficits/detail RUE Deficits / Details: observed at least 3+/5 LUE Deficits / Details: hx CVA, L hemiplegia    Lower Extremity Assessment Lower Extremity Assessment: LLE deficits/detail;RLE deficits/detail RLE Deficits / Details: grossly 2+/5 LLE Deficits / Details: hx CVA, L hemiparesis       Communication   Communication: No difficulties  Cognition Arousal/Alertness: Awake/alert Behavior During Therapy: WFL for tasks assessed/performed Overall Cognitive Status: No family/caregiver present to determine baseline cognitive functioning  General Comments: hx dementia, pt not even orientated to self (unable to state his last name)      General Comments      Exercises     Assessment/Plan    PT Assessment Patient needs continued PT services  PT Problem List Decreased strength;Decreased mobility;Decreased balance;Decreased activity tolerance;Cardiopulmonary status limiting activity       PT Treatment Interventions DME instruction;Functional mobility training;Therapeutic activities;Patient/family education;Wheelchair mobility training;Therapeutic exercise    PT Goals (Current goals can be found in the Care Plan section)  Acute Rehab PT Goals PT Goal Formulation: Patient unable to participate in goal setting Time For Goal Achievement: 03/14/20 Potential  to Achieve Goals: Fair    Frequency Min 2X/week   Barriers to discharge        Co-evaluation               AM-PAC PT "6 Clicks" Mobility  Outcome Measure Help needed turning from your back to your side while in a flat bed without using bedrails?: Total Help needed moving from lying on your back to sitting on the side of a flat bed without using bedrails?: Total Help needed moving to and from a bed to a chair (including a wheelchair)?: Total Help needed standing up from a chair using your arms (e.g., wheelchair or bedside chair)?: Total Help needed to walk in hospital room?: Total Help needed climbing 3-5 steps with a railing? : Total 6 Click Score: 6    End of Session   Activity Tolerance: Patient tolerated treatment well Patient left: in bed;with call bell/phone within reach;with bed alarm set Nurse Communication: Mobility status;Need for lift equipment PT Visit Diagnosis: Other abnormalities of gait and mobility (R26.89)    Time: 9417-4081 PT Time Calculation (min) (ACUTE ONLY): 18 min   Charges:   PT Evaluation $PT Eval Low Complexity: 1 Low        Kati PT, DPT Acute Rehabilitation Services Pager: [phone] Office: [phone]  York Ram E 02/29/2020, 1:44 PM

## 2020-02-29 NOTE — TOC Initial Note (Addendum)
Transition of Care [facility]) - Initial/Assessment Note    Patient Details  Name: [name] MRN: [id number] Date of Birth: 1950-02-25  Transition of Care [facility]) CM/SW Contact:    Ross Ludwig, LCSW Phone Number: 02/29/2020, 5:12 PM  Clinical Narrative:                  Patient is a 70 year old male who is alert and oriented x1.  Patient is a long term care resident at [facility].  Patient has some confusion, assessment completed by speaking to patient's [name], [phone].  Per patient's HCPOA, she has some concerns about care that is being provided at [facility].  Per HCPOA, she was concerned about patient's wound not healing properly, and she is worried that it may get worse.  Per patient's HCPOA she is hesitant about sending patient back to SNF even though he has been there nearly 3 years.  CSW advised that she speak to the social worker, DON, and administrator at SNF regarding the concerns and issues that she has.  CSW advised that she talk to them about preparing a care plan designed to improve care of patient, and also to discuss what she would like done to help improve care for patient.  CSW suggested that a referral could be made for hospice services to follow patient at SNF, which could help at lease provide an extra set of eyes on patient to help with care.  [name] stated she will call [facility] to try to set up a meeting to discuss concerns that she has.    CSW informed [name] admission worker that patient's HCPOA will contact [name] the social worker to set up a meeting.  Per [name] patient can return anytime if HCPOA is agreeable to him returning and once he is medically ready for discharge.  HCPOA asked CSW if it is possible to get patient placed at a different facility, CSW informed her that because he is a long term care Medicaid patient, it will be very difficult to find a different SNF that is able to accept patient especially if she wants to stay in  [facility].  CSW informed her that because of the current pandemic trying to find even short term rehab beds for patients is challenging because there are not many beds available.  Also since patient is Covid + that limits even more choices for her and patient.  CSW offered to look for diffferent SNFs however patient's HCPOA said to wait tills she speaks to someone at [facility].  Patient's HCPOA asked if it would be possible to have patient come to her house and have him stay with her.  CSW informed her that if she want to pursue this option, then CSW advised that he go to her house with hospice services to follow patient.  CSW said this could help with providing extra care in the home and also help with trying to get equipment set up in the home.  Per patient's HCPOA, she is talking to DSS to see if it is possible to help patient become eligible for CAPS program to get extra help in the home.  CSW told her what PT recommendations were, and said that patient is complete care, and a two person transfer, which may be challenging for her to take care of him.  Per [name] she is interested in finding out if in home care providers would be an option.  CSW informed her that  the private duty care agencies would be private pay, and she would have to pay out of pocker for them.  [name] expressed understanding, CSW offered to email a list of private duty agencies, [place] emailed her a copy of agencies at [email or web address].  CSW also provided information about different hospice agencies that provide in home hospice services.  Patient's HCPOA will contact CSW about decision about disposition plan.  CSW to continue to follow patient's progress.  Patient Goals and CMS Choice  Choice offered to / list presented to : [place] / Guardian Expected Discharge Plan: [place] In-house Referral: Clinical Social Work   Post Acute Care Choice: [place] Living arrangements for the past 2  months: [place]                                      Prior Living Arrangements/Services Living arrangements for the past 2 months: [place] Lives with:: Facility Resident Patient language and need for interpreter reviewed:: Yes Do you feel safe going back to the place where you live?: Yes      Need for Family Participation in Patient Care: Yes (Comment) Care giver support system in place?: Yes (comment)   Criminal Activity/Legal Involvement Pertinent to Current Situation/Hospitalization: No - Comment as needed  Activities of Daily Living Home Assistive Devices/Equipment: Blood pressure cuff,Grab bars around toilet,Grab bars in shower,Hand-held shower hose,Hospital bed,Hoyer Lift,Wheelchair,Nebulizer,Oxygen,Scales ([facility] has necessary equipment for their patients) ADL Screening (condition at time of admission) Patient's cognitive ability adequate to safely complete daily activities?: Yes Is the patient deaf or have difficulty hearing?: No Does the patient have difficulty seeing, even when wearing glasses/contacts?: No Does the patient have difficulty concentrating, remembering, or making decisions?: Yes Patient able to express need for assistance with ADLs?: Yes Does the patient have difficulty dressing or bathing?: Yes Independently performs ADLs?: No Communication: Independent Dressing (OT): Dependent Is this a change from baseline?: Change from baseline, expected to last >3 days Grooming: Dependent Is this a change from baseline?: Change from baseline, expected to last >3 days Feeding: Needs assistance Is this a change from baseline?: Pre-admission baseline Bathing: Dependent Is this a change from baseline?: Change from baseline, expected to last >3 days Toileting: Dependent Is this a change from baseline?: Pre-admission baseline In/Out Bed: Dependent Is this a change from baseline?: Pre-admission baseline Walks in Home:  Dependent (patient stays in a wheelchair most of the time) Is this a change from baseline?: Pre-admission baseline Does the patient have difficulty walking or climbing stairs?: Yes (secondary to weakness L>R) Weakness of Legs: Both Weakness of Arms/Hands: Left  Permission Sought/Granted Permission sought to share information with : Facility Product/process development scientist (comment) (HCPOA) Permission granted to share information with : Yes, Verbal Permission Granted  Share Information with NAME: Demaris Callander   [phone]  Permission granted to share info w AGENCY: SNF admissions        Emotional Assessment Appearance:: Appears stated age   Affect (typically observed): Accepting,Calm,Appropriate Orientation: : Oriented to Self Alcohol / Substance Use: Not Applicable Psych Involvement: No (comment)  Admission diagnosis:  Hypoxia [R09.02] COVID [U07.1] Acute respiratory disease due to COVID-19 virus [U07.1, J06.9] Patient Active Problem List   Diagnosis Date Noted  . Pressure injury of skin 02/27/2020  . Acute respiratory disease due to COVID-19 virus 02/27/2020  . Acute on chronic respiratory failure with hypoxia ([place]) 02/27/2020  .  Pleural effusion, left 02/27/2020  . Renal insufficiency 02/27/2020  . Adenocarcinoma of right lung, stage 1 ([place]) 04/26/2019  . Goals of care, counseling/discussion 10/12/2018  . Gastric and duodenal angiodysplasia   . Polyp of descending colon   . Polyp of rectum   . Iron deficiency anemia   . Abnormal CT scan, colon   . Heme + stool   . Lung mass   . Symptomatic anemia 08/22/2018  . IV site infection ([place]) 03/31/2018  . Left leg weakness   . Stenosis of brachiocephalic artery ([place]) 37/05/8887  . Lung nodule, solitary 03/28/2018  . Solitary pulmonary nodule on lung CT   . Normocytic anemia 03/26/2018  . Abdominal pain, acute, left upper quadrant   . Dysarthria   . Recurrent abdominal pain   . Dementia due to another medical  condition, without behavioral disturbance ([place])   . Pruritus   . Left hemiplegia ([place])   . Insomnia 01/29/2016  . Psoriasiform dermatitis 07/12/2015  . Type 2 diabetes mellitus with peripheral neuropathy ([place]) 03/13/2015  . History of Hemiparesis affecting left side as late effect of cerebrovascular accident ([place]) 02/18/2015  . Coronary artery disease involving native coronary artery of native heart without angina pectoris   . History of stroke   . HLD (hyperlipidemia) 11/01/2014  . Tobacco use disorder 11/01/2014  . Carotid stenosis 11/01/2014  . Essential hypertension   . Polysubstance abuse ([place]) 08/28/2014  . Cocaine abuse ([place])   . Intracranial carotid stenosis   . History of DVT (deep vein thrombosis)   . Alcohol use disorder, moderate, dependence ([place])   . Stroke ([place])   . Chronic diastolic congestive heart failure ([place]) 11/21/2013  . GERD (gastroesophageal reflux disease) 08/10/2013  . Gout 08/10/2013  . Peripheral vascular disease ([place]) 07/27/2012  . COPD (chronic obstructive pulmonary disease) ([place]) 04/04/2012   PCP:  Garwin Brothers, MD Pharmacy:   [facility] [place], [place] - [address] [place] [address] Phone: [phone] Fax: [phone]     Social Determinants of Health ([place]) Interventions    Readmission Risk Interventions Readmission Risk Prevention Plan 08/23/2018  Transportation Screening Complete  PCP or Specialist Appt within 3-5 Days Complete  HRI or [place] Not Complete  Palliative Care Screening Complete  Medication Review (RN Care Manager) Referral to Pharmacy  Some recent data might be hidden

## 2020-02-29 NOTE — Progress Notes (Signed)
PROGRESS NOTE  [name]  QIO:[id number] DOB: [birth year] DOA: 02/26/2020 PCP: Garwin Brothers, MD   Brief Narrative: IVAAN LIDDY is a 70 y.o. male with a history of CVA with hemiparesis, seizure disorder, polysubstance and alcohol abuse, COPD on 2L O2 at baseline, dementia, NSCLC s/p SBRT who presented from SNF due to hypoxia, found to have positive SARS-CoV-2 testing and bilateral infiltrates on CXR. SCr up to 1.3 from baseline around 0.5 as well. Remdesivir and steroids provided and the patient admitted requiring 6L O2.   Assessment & Plan: Principal Problem:   Acute respiratory disease due to COVID-19 virus Active Problems:   COPD (chronic obstructive pulmonary disease) (HCC)   Chronic diastolic congestive heart failure (HCC)   History of DVT (deep vein thrombosis)   Essential hypertension   History of stroke   Coronary artery disease involving native coronary artery of native heart without angina pectoris   Dementia due to another medical condition, without behavioral disturbance ([place])   Adenocarcinoma of right lung, stage 1 (HCC)   Pressure injury of skin   Acute on chronic respiratory failure with hypoxia (HCC)   Pleural effusion, left   Renal insufficiency  Acute on chronic hypoxic respiratory failure due to covid-19 pneumonia on COPD without exacerbation:  - Wean oxygen as able, SpO2 <90% at rest on room air on my attempt. - Continue remdesivir (day 3 today), steroids, CRP responding. Taper steroids dosing. - Wean oxygen back to baseline as tolerated.  - Lovenox, proning, IS, FV, monitor CMP and inflammatory markers.  - Bronchopneumonia suggested on CT though WBC normal, afebrile and PCT 0.11 without antibiotics. Will monitor off abx.  - Continue bronchodilators  AKI: Continues improvement. - Holding lasix, ARB.  Hypovolemic hypernatremia:  - Monitoring, push free water. Has been elevated in past as well.  Hypokalemia: Improving. - Supplement again  today  Chronic HFpEF:  - Monitor volume status  Lung cancer stage Ia non-small cell involving the right upper lobe: Oncology has been notified he was supposed to have a schedule CT in June 2021 we will go ahead and repeated on this admission.  Thrombocytopenia: Likely due to covid, resolved. - Monitor intermittently.  Essential hypertension: - Hold ARB and Lasix.  Seizure disorder: - Continue keppra.  Left pleural effusion: Repeat CT scan showing this with atelectasis.  - Repeat imaging after convalescence suggested.  Dementia due to another medical condition, without behavioral disturbance ([place]) At baseline can resume his home regimen once the med rec is done.  CAD, aortic atherosclerosis: Noted.  RN Pressure Injury Documentation: Pressure Injury 02/27/20 Sacrum Mid Stage 3 -  Full thickness tissue loss. Subcutaneous fat may be visible but bone, tendon or muscle are NOT exposed. (Active)  02/27/20 0408  Location: Sacrum  Location Orientation: Mid  Staging: Stage 3 -  Full thickness tissue loss. Subcutaneous fat may be visible but bone, tendon or muscle are NOT exposed.  Wound Description (Comments):   Present on Admission: Yes   Obesity: Estimated body mass index is 30.31 kg/m as calculated from the following:   Height as of this encounter: 6\' 1"  (1.854 m).   Weight as of this encounter: 104.2 kg.  DVT prophylaxis: Lovenox Code Status: DNR Family Communication: None Disposition Plan:  Status is: Inpatient  Remains inpatient appropriate because:IV treatments appropriate due to intensity of illness or inability to take PO   Dispo: The patient is from: SNF              Anticipated d/c  is to: SNF              Anticipated d/c date is: 1 day              Patient currently is not medically stable to d/c.  Consultants:   None  Procedures:   None  Antimicrobials:  Remdesivir   Subjective: No dyspnea, pain, N/V/D, fever, or chills. Wants to eat. No other  complaints. Feels better than initial presentation, though has spotty memory in that regard.  Objective: Vitals:   02/29/20 0611 02/29/20 0746 02/29/20 0905 02/29/20 1328  BP: 140/67   (!) 141/87  Pulse: 85 90  94  Resp: 18     Temp: 97.8 F (36.6 C)   98.8 F (37.1 C)  TempSrc: Oral   Oral  SpO2: 95% 93% 90% 90%  Weight:      Height:        Intake/Output Summary (Last 24 hours) at 02/29/2020 1449 Last data filed at 02/29/2020 1304 Gross per 24 hour  Intake 680 ml  Output 701 ml  Net -21 ml   Filed Weights   02/28/20 0023 02/28/20 0500  Weight: 104.2 kg 104.2 kg   Gen: 70 y.o. male in no distress Pulm: Nonlabored breathing room air with SpO2 88-89%, diminished at L > R base. CV: Regular rate and rhythm. No murmur, rub, or gallop. No JVD, no pitting dependent edema. GI: Abdomen soft, non-tender, non-distended, with normoactive bowel sounds.  Ext: Warm, no deformities Skin: No new rashes, lesions or ulcers on visualized skin. Neuro: Alert and incompletely oriented. No new focal neurological deficits - stable left hemiparesis. Psych: Judgement and insight appear impaired. Mood euthymic & affect congruent. Behavior is appropriate.    Data Reviewed: I have personally reviewed following labs and imaging studies  CBC: Recent Labs  Lab 02/27/20 0009 02/28/20 0433 02/29/20 0442  WBC 5.5 4.6 4.7  NEUTROABS 4.2 3.6 3.9  HGB 13.3 12.7* 12.6*  HCT 43.8 41.2 40.4  MCV 95.8 93.2 92.2  PLT 142* 131* 762   Basic Metabolic Panel: Recent Labs  Lab 02/27/20 0009 02/28/20 0433 02/29/20 0442  NA 146* 147* 148*  K 4.3 3.0* 3.4*  CL 107 111 113*  CO2 27 26 23   GLUCOSE 165* 166* 166*  BUN 60* 48* 48*  CREATININE 1.32* 0.68 0.76  CALCIUM 8.9 9.1 9.4  MG  --  2.4  --   PHOS  --  2.9  --    GFR: Estimated Creatinine Clearance: 108.9 mL/min (by C-G formula based on SCr of 0.76 mg/dL). Liver Function Tests: Recent Labs  Lab 02/27/20 0009 02/28/20 0433 02/29/20 0442  AST  40 29 33  ALT 20 19 18   ALKPHOS 59 52 50  BILITOT 0.9 0.6 0.6  PROT 7.0 6.2* 6.2*  ALBUMIN 3.5 3.1* 3.2*   No results for input(s): LIPASE, AMYLASE in the last 168 hours. No results for input(s): AMMONIA in the last 168 hours. Coagulation Profile: No results for input(s): INR, PROTIME in the last 168 hours. Cardiac Enzymes: No results for input(s): CKTOTAL, CKMB, CKMBINDEX, TROPONINI in the last 168 hours. BNP (last 3 results) No results for input(s): PROBNP in the last 8760 hours. HbA1C: Recent Labs    02/28/20 0433  HGBA1C 5.6   CBG: Recent Labs  Lab 02/28/20 1645 02/28/20 2004 02/28/20 2305 02/29/20 0747 02/29/20 1134  GLUCAP 136* 168* 211* 164* 227*   Lipid Profile: No results for input(s): CHOL, HDL, LDLCALC, TRIG, CHOLHDL, LDLDIRECT in the  last 72 hours. Thyroid Function Tests: No results for input(s): TSH, T4TOTAL, FREET4, T3FREE, THYROIDAB in the last 72 hours. Anemia Panel: Recent Labs    02/27/20 0633  FERRITIN 124   Urine analysis:    Component Value Date/Time   COLORURINE YELLOW 08/22/2018 1900   APPEARANCEUR HAZY (A) 08/22/2018 1900   LABSPEC 1.044 (H) 08/22/2018 1900   PHURINE 5.0 08/22/2018 1900   GLUCOSEU NEGATIVE 08/22/2018 1900   HGBUR NEGATIVE 08/22/2018 1900   BILIRUBINUR NEGATIVE 08/22/2018 1900   KETONESUR NEGATIVE 08/22/2018 1900   PROTEINUR 30 (A) 08/22/2018 1900   UROBILINOGEN 0.2 08/19/2014 2103   NITRITE NEGATIVE 08/22/2018 1900   LEUKOCYTESUR NEGATIVE 08/22/2018 1900   Recent Results (from the past 240 hour(s))  Resp Panel by RT-PCR (Flu A&B, Covid) Nasopharyngeal Swab     Status: Abnormal   Collection Time: 02/27/20 12:10 AM   Specimen: Nasopharyngeal Swab; Nasopharyngeal(NP) swabs in vial transport medium  Result Value Ref Range Status   SARS Coronavirus 2 by RT PCR POSITIVE (A) NEGATIVE Final    Comment: RESULT CALLED TO, READ BACK BY AND VERIFIED WITH: MULDOON S.1.18.22 @ 0236 BY MECIAL J. (NOTE) SARS-CoV-2 target  nucleic acids are DETECTED.  The SARS-CoV-2 RNA is generally detectable in upper respiratory specimens during the acute phase of infection. Positive results are indicative of the presence of the identified virus, but do not rule out bacterial infection or co-infection with other pathogens not detected by the test. Clinical correlation with patient history and other diagnostic information is necessary to determine patient infection status. The expected result is Negative.  Fact Sheet for Patients: EntrepreneurPulse.com.au  Fact Sheet for Healthcare Providers: IncredibleEmployment.be  This test is not yet approved or cleared by the [place] FDA and  has been authorized for detection and/or diagnosis of SARS-CoV-2 by FDA under an Emergency Use Authorization (EUA).  This EUA will remain in effect (meaning this test ca n be used) for the duration of  the COVID-19 declaration under Section 564(b)(1) of the Act, 21 U.S.C. section 360bbb-3(b)(1), unless the authorization is terminated or revoked sooner.     Influenza A by PCR NEGATIVE NEGATIVE Final   Influenza B by PCR NEGATIVE NEGATIVE Final    Comment: (NOTE) The Xpert Xpress SARS-CoV-2/FLU/RSV plus assay is intended as an aid in the diagnosis of influenza from Nasopharyngeal swab specimens and should not be used as a sole basis for treatment. Nasal washings and aspirates are unacceptable for Xpert Xpress SARS-CoV-2/FLU/RSV testing.  Fact Sheet for Patients: EntrepreneurPulse.com.au  Fact Sheet for Healthcare Providers: IncredibleEmployment.be  This test is not yet approved or cleared by the [place] FDA and has been authorized for detection and/or diagnosis of SARS-CoV-2 by FDA under an Emergency Use Authorization (EUA). This EUA will remain in effect (meaning this test can be used) for the duration of the COVID-19 declaration under Section  564(b)(1) of the Act, 21 U.S.C. section 360bbb-3(b)(1), unless the authorization is terminated or revoked.  Performed at [facility], [place] [address] [place], [place] [address]       Radiology Studies: No results found.  Scheduled Meds: . enoxaparin (LOVENOX) injection  40 mg Subcutaneous Q24H  . insulin aspart  0-5 Units Subcutaneous QHS  . insulin aspart  0-6 Units Subcutaneous TID WC  . levETIRAcetam  500 mg Oral BID  . methylPREDNISolone (SOLU-MEDROL) injection  100 mg Intravenous Q12H  . mometasone-formoterol  2 puff Inhalation BID   Continuous Infusions: . remdesivir 100 mg in NS 100  mL 100 mg (02/29/20 0854)     LOS: 2 days   Time spent: 25 minutes.  Patrecia Pour, MD [facility] [email or web address] 02/29/2020, 2:49 PM

## 2020-03-01 LAB — BASIC METABOLIC PANEL
Anion gap: 6 (ref 5–15)
BUN: 30 mg/dL — ABNORMAL HIGH (ref 8–23)
CO2: 30 mmol/L (ref 22–32)
Calcium: 9.5 mg/dL (ref 8.9–10.3)
Chloride: 117 mmol/L — ABNORMAL HIGH (ref 98–111)
Creatinine, Ser: 0.72 mg/dL (ref 0.61–1.24)
GFR, Estimated: >60 mL/min (ref >60)
Glucose, Bld: 258 mg/dL — ABNORMAL HIGH (ref 70–99)
Potassium: 3.7 mmol/L (ref 3.5–5.1)
Sodium: 153 mmol/L — ABNORMAL HIGH (ref 135–145)

## 2020-03-01 LAB — GLUCOSE, CAPILLARY
Glucose-Capillary: 191 mg/dL — ABNORMAL HIGH (ref 70–99)
Glucose-Capillary: 217 mg/dL — ABNORMAL HIGH (ref 70–99)
Glucose-Capillary: 201 mg/dL — ABNORMAL HIGH (ref 70–99)
Glucose-Capillary: 192 mg/dL — ABNORMAL HIGH (ref 70–99)

## 2020-03-01 LAB — COMPREHENSIVE METABOLIC PANEL
ALT: 21 U/L (ref 0–44)
AST: 31 U/L (ref 15–41)
Albumin: 3.3 g/dL — ABNORMAL LOW (ref 3.5–5.0)
Alkaline Phosphatase: 57 U/L (ref 38–126)
Anion gap: 12 (ref 5–15)
BUN: 36 mg/dL — ABNORMAL HIGH (ref 8–23)
CO2: 24 mmol/L (ref 22–32)
Calcium: 9.5 mg/dL (ref 8.9–10.3)
Chloride: 116 mmol/L — ABNORMAL HIGH (ref 98–111)
Creatinine, Ser: 0.63 mg/dL (ref 0.61–1.24)
GFR, Estimated: >60 mL/min (ref >60)
Glucose, Bld: 226 mg/dL — ABNORMAL HIGH (ref 70–99)
Potassium: 3.5 mmol/L (ref 3.5–5.1)
Sodium: 152 mmol/L — ABNORMAL HIGH (ref 135–145)
Total Bilirubin: 0.6 mg/dL (ref 0.3–1.2)
Total Protein: 6.2 g/dL — ABNORMAL LOW (ref 6.5–8.1)

## 2020-03-01 LAB — C-REACTIVE PROTEIN: CRP: 1.2 mg/dL — ABNORMAL HIGH (ref <1.0)

## 2020-03-01 MED ORDER — MIRTAZAPINE 15 MG PO TABS
7.5000 mg | ORAL_TABLET | Freq: Every day | ORAL | Status: DC
  Administered 2020-03-01: 7.5 mg via ORAL
  Filled 2020-03-01: qty 1

## 2020-03-01 MED ORDER — DULOXETINE HCL 20 MG PO CPEP
40.0000 mg | ORAL_CAPSULE | Freq: Every day | ORAL | Status: DC
  Administered 2020-03-01 – 2020-03-02 (×2): 40 mg via ORAL
  Filled 2020-03-01 (×2): qty 2

## 2020-03-01 MED ORDER — MELATONIN 5 MG PO TABS
5.0000 mg | ORAL_TABLET | Freq: Every day | ORAL | Status: DC
  Administered 2020-03-01: 5 mg via ORAL
  Filled 2020-03-01: qty 1

## 2020-03-01 MED ORDER — SENNOSIDES-DOCUSATE SODIUM 8.6-50 MG PO TABS
2.0000 | ORAL_TABLET | Freq: Every day | ORAL | Status: DC
  Administered 2020-03-01: 2 via ORAL
  Filled 2020-03-01: qty 2

## 2020-03-01 MED ORDER — LOSARTAN POTASSIUM 25 MG PO TABS
25.0000 mg | ORAL_TABLET | Freq: Every day | ORAL | Status: DC
  Administered 2020-03-01 – 2020-03-02 (×2): 25 mg via ORAL
  Filled 2020-03-01 (×2): qty 1

## 2020-03-01 MED ORDER — POTASSIUM CHLORIDE CRYS ER 20 MEQ PO TBCR
40.0000 meq | EXTENDED_RELEASE_TABLET | Freq: Once | ORAL | Status: AC
  Administered 2020-03-01: 40 meq via ORAL
  Filled 2020-03-01: qty 2

## 2020-03-01 MED ORDER — PREGABALIN 75 MG PO CAPS
150.0000 mg | ORAL_CAPSULE | Freq: Two times a day (BID) | ORAL | Status: DC
  Administered 2020-03-01 – 2020-03-02 (×3): 150 mg via ORAL
  Filled 2020-03-01 (×3): qty 2

## 2020-03-01 MED ORDER — FERROUS SULFATE 325 (65 FE) MG PO TABS
325.0000 mg | ORAL_TABLET | Freq: Every day | ORAL | Status: DC
  Administered 2020-03-01 – 2020-03-02 (×2): 325 mg via ORAL
  Filled 2020-03-01 (×2): qty 1

## 2020-03-01 MED ORDER — DEXTROSE 5 % IV SOLN: INTRAVENOUS | Status: DC

## 2020-03-01 MED ORDER — POLYETHYLENE GLYCOL 3350 17 G PO PACK
17.0000 g | PACK | Freq: Every day | ORAL | Status: DC
  Administered 2020-03-01 – 2020-03-02 (×2): 17 g via ORAL
  Filled 2020-03-01 (×2): qty 1

## 2020-03-01 MED ORDER — TRAZODONE HCL 50 MG PO TABS
150.0000 mg | ORAL_TABLET | Freq: Every day | ORAL | Status: DC
  Administered 2020-03-01: 150 mg via ORAL
  Filled 2020-03-01: qty 1

## 2020-03-01 MED ORDER — TRAMADOL HCL 50 MG PO TABS: 50.0000 mg | ORAL_TABLET | Freq: Three times a day (TID) | ORAL | Status: DC | PRN

## 2020-03-01 MED ORDER — MEMANTINE HCL 10 MG PO TABS
10.0000 mg | ORAL_TABLET | Freq: Two times a day (BID) | ORAL | Status: DC
  Administered 2020-03-01 – 2020-03-02 (×3): 10 mg via ORAL
  Filled 2020-03-01 (×3): qty 1

## 2020-03-01 MED ORDER — DOCUSATE SODIUM 100 MG PO CAPS
100.0000 mg | ORAL_CAPSULE | Freq: Every day | ORAL | Status: DC
  Administered 2020-03-01 – 2020-03-02 (×2): 100 mg via ORAL
  Filled 2020-03-01 (×2): qty 1

## 2020-03-01 MED ORDER — ATORVASTATIN CALCIUM 40 MG PO TABS
80.0000 mg | ORAL_TABLET | Freq: Every day | ORAL | Status: DC
  Administered 2020-03-01 – 2020-03-02 (×2): 80 mg via ORAL
  Filled 2020-03-01 (×2): qty 2

## 2020-03-01 MED ORDER — FUROSEMIDE 40 MG PO TABS
80.0000 mg | ORAL_TABLET | Freq: Every day | ORAL | Status: DC
  Administered 2020-03-01 – 2020-03-02 (×2): 80 mg via ORAL
  Filled 2020-03-01 (×2): qty 2

## 2020-03-01 MED ORDER — IPRATROPIUM-ALBUTEROL 20-100 MCG/ACT IN AERS
1.0000 | INHALATION_SPRAY | Freq: Four times a day (QID) | RESPIRATORY_TRACT | Status: DC
  Administered 2020-03-01 – 2020-03-02 (×6): 1 via RESPIRATORY_TRACT
  Filled 2020-03-01: qty 4

## 2020-03-01 NOTE — Progress Notes (Signed)
PROGRESS NOTE  [name]  TWS:[id number] DOB: [birth year] DOA: 02/26/2020 PCP: Garwin Brothers, MD   Brief Narrative: [name] is a 70 y.o. male with a history of CVA with hemiparesis, seizure disorder, polysubstance and alcohol abuse, COPD on 2L O2 at baseline, dementia, NSCLC s/p SBRT who presented from SNF due to hypoxia, found to have positive SARS-CoV-2 testing and bilateral infiltrates on CXR. SCr up to 1.3 from baseline around 0.5 as well. Remdesivir and steroids provided and the patient admitted requiring 6L O2. Hypoxia has improved, though the patient is growing more hypernatremic.  Assessment & Plan: Principal Problem:   Acute respiratory disease due to COVID-19 virus Active Problems:   COPD (chronic obstructive pulmonary disease) (HCC)   Chronic diastolic congestive heart failure (HCC)   History of DVT (deep vein thrombosis)   Essential hypertension   History of stroke   Coronary artery disease involving native coronary artery of native heart without angina pectoris   Dementia due to another medical condition, without behavioral disturbance ([place])   Adenocarcinoma of right lung, stage 1 (HCC)   Pressure injury of skin   Acute on chronic respiratory failure with hypoxia (HCC)   Pleural effusion, left   Renal insufficiency  Acute on chronic hypoxic respiratory failure due to covid-19 pneumonia on COPD without exacerbation:  - Wean oxygen as able  - Continue remdesivir (day 4 today), steroids, CRP responding. Taper steroids dosing. - Lovenox, proning, IS, FV, monitor CMP and inflammatory markers.  - Bronchopneumonia suggested on CT though WBC normal, afebrile and PCT 0.11 without antibiotics. Monitoring off abx.  - Continue bronchodilators  AKI: Improved - Can restart lasix, ARB with elevated BP.   Hypernatremia: Acute on chronic.  - Started D5W this AM, suspect pt's altered mental status is causing diminished free water intake. Will encourage RN to emphasize free  water by mouth. Recheck of Na this PM shows no improvement (we did have to restart lasix which may contribute to that), will augment rate of D5W.  Hypokalemia: Resolved.  Chronic HFpEF:  - Monitor volume status, restart home meds.  Lung cancer stage Ia non-small cell involving the right upper lobe: Oncology has been notified.  Thrombocytopenia: Likely due to covid, resolved.  Essential hypertension: - Restart ARB and Lasix.  Seizure disorder: - Continue keppra.  Left pleural effusion: Repeat CT scan showing this with atelectasis.  - Repeat imaging after convalescence suggested.  Dementia due to another medical condition, without behavioral disturbance ([place]) At baseline can resume his home regimen once the med rec is done.  CAD, aortic atherosclerosis: Noted.  RN Pressure Injury Documentation: Pressure Injury 02/27/20 Sacrum Mid Stage 3 -  Full thickness tissue loss. Subcutaneous fat may be visible but bone, tendon or muscle are NOT exposed. (Active)  02/27/20 0408  Location: Sacrum  Location Orientation: Mid  Staging: Stage 3 -  Full thickness tissue loss. Subcutaneous fat may be visible but bone, tendon or muscle are NOT exposed.  Wound Description (Comments):   Present on Admission: Yes   Obesity: Estimated body mass index is 29.7 kg/m as calculated from the following:   Height as of this encounter: 6\' 1"  (1.854 m).   Weight as of this encounter: 102.1 kg.  DVT prophylaxis: Lovenox Code Status: DNR Family Communication: None Disposition Plan:  Status is: Inpatient  Remains inpatient appropriate because:IV treatments appropriate due to intensity of illness or inability to take PO   Dispo: The patient is from: SNF  Anticipated d/c is to: SNF              Anticipated d/c date is: 1 day              Patient currently is not medically stable to d/c.  Consultants:   None  Procedures:   None  Antimicrobials:  Remdesivir   Subjective: No  complaints, delirious overnight. Able to drink and is thirsty.   Objective: Vitals:   03/01/20 0516 03/01/20 0600 03/01/20 0825 03/01/20 1332  BP: (!) 158/101  (!) 156/108 (!) 126/59  Pulse: 100  88 85  Resp: 20   16  Temp: 97.9 F (36.6 C)   (!) 97.5 F (36.4 C)  TempSrc: Oral   Oral  SpO2: 93%   95%  Weight:  102.1 kg    Height:        Intake/Output Summary (Last 24 hours) at 03/01/2020 1751 Last data filed at 03/01/2020 1516 Gross per 24 hour  Intake 1824.58 ml  Output 800 ml  Net 1024.58 ml   Filed Weights   02/28/20 0023 02/28/20 0500 03/01/20 0600  Weight: 104.2 kg 104.2 kg 102.1 kg   Gen: 70 y.o. male in no distress Pulm: Nonlabored breathing room air. Clear. CV: Regular rate and rhythm. No murmur, rub, or gallop. No JVD, no dependent edema. GI: Abdomen soft, non-tender, non-distended, with normoactive bowel sounds.  Ext: Warm, no deformities Skin: No rashes, lesions or ulcers on visualized skin. Neuro: Alert and disoriented. Left hemiparesis. Psych: Judgement and insight appear impaired. Mood euthymic & affect congruent. Behavior is appropriate.    Data Reviewed: I have personally reviewed following labs and imaging studies  CBC: Recent Labs  Lab 02/27/20 0009 02/28/20 0433 02/29/20 0442  WBC 5.5 4.6 4.7  NEUTROABS 4.2 3.6 3.9  HGB 13.3 12.7* 12.6*  HCT 43.8 41.2 40.4  MCV 95.8 93.2 92.2  PLT 142* 131* 854   Basic Metabolic Panel: Recent Labs  Lab 02/27/20 0009 02/28/20 0433 02/29/20 0442 03/01/20 0444 03/01/20 1646  NA 146* 147* 148* 152* 153*  K 4.3 3.0* 3.4* 3.5 3.7  CL 107 111 113* 116* 117*  CO2 27 26 23 24 30   GLUCOSE 165* 166* 166* 226* 258*  BUN 60* 48* 48* 36* 30*  CREATININE 1.32* 0.68 0.76 0.63 0.72  CALCIUM 8.9 9.1 9.4 9.5 9.5  MG  --  2.4  --   --   --   PHOS  --  2.9  --   --   --    GFR: Estimated Creatinine Clearance: 107.9 mL/min (by C-G formula based on SCr of 0.72 mg/dL). Liver Function Tests: Recent Labs  Lab  02/27/20 0009 02/28/20 0433 02/29/20 0442 03/01/20 0444  AST 40 29 33 31  ALT 20 19 18 21   ALKPHOS 59 52 50 57  BILITOT 0.9 0.6 0.6 0.6  PROT 7.0 6.2* 6.2* 6.2*  ALBUMIN 3.5 3.1* 3.2* 3.3*   No results for input(s): LIPASE, AMYLASE in the last 168 hours. No results for input(s): AMMONIA in the last 168 hours. Coagulation Profile: No results for input(s): INR, PROTIME in the last 168 hours. Cardiac Enzymes: No results for input(s): CKTOTAL, CKMB, CKMBINDEX, TROPONINI in the last 168 hours. BNP (last 3 results) No results for input(s): PROBNP in the last 8760 hours. HbA1C: Recent Labs    02/28/20 0433  HGBA1C 5.6   CBG: Recent Labs  Lab 02/29/20 1650 02/29/20 2031 03/01/20 0824 03/01/20 1115 03/01/20 1659  GLUCAP 134* 255* 191*  217* 201*   Lipid Profile: No results for input(s): CHOL, HDL, LDLCALC, TRIG, CHOLHDL, LDLDIRECT in the last 72 hours. Thyroid Function Tests: No results for input(s): TSH, T4TOTAL, FREET4, T3FREE, THYROIDAB in the last 72 hours. Anemia Panel: No results for input(s): VITAMINB12, FOLATE, FERRITIN, TIBC, IRON, RETICCTPCT in the last 72 hours. Urine analysis:    Component Value Date/Time   COLORURINE YELLOW 08/22/2018 1900   APPEARANCEUR HAZY (A) 08/22/2018 1900   LABSPEC 1.044 (H) 08/22/2018 1900   PHURINE 5.0 08/22/2018 1900   GLUCOSEU NEGATIVE 08/22/2018 1900   HGBUR NEGATIVE 08/22/2018 1900   BILIRUBINUR NEGATIVE 08/22/2018 1900   KETONESUR NEGATIVE 08/22/2018 1900   PROTEINUR 30 (A) 08/22/2018 1900   UROBILINOGEN 0.2 08/19/2014 2103   NITRITE NEGATIVE 08/22/2018 1900   LEUKOCYTESUR NEGATIVE 08/22/2018 1900   Recent Results (from the past 240 hour(s))  Resp Panel by RT-PCR (Flu A&B, Covid) Nasopharyngeal Swab     Status: Abnormal   Collection Time: 02/27/20 12:10 AM   Specimen: Nasopharyngeal Swab; Nasopharyngeal(NP) swabs in vial transport medium  Result Value Ref Range Status   SARS Coronavirus 2 by RT PCR POSITIVE (A) NEGATIVE  Final    Comment: RESULT CALLED TO, READ BACK BY AND VERIFIED WITH: MULDOON S.1.18.22 @ 0236 BY MECIAL J. (NOTE) SARS-CoV-2 target nucleic acids are DETECTED.  The SARS-CoV-2 RNA is generally detectable in upper respiratory specimens during the acute phase of infection. Positive results are indicative of the presence of the identified virus, but do not rule out bacterial infection or co-infection with other pathogens not detected by the test. Clinical correlation with patient history and other diagnostic information is necessary to determine patient infection status. The expected result is Negative.  Fact Sheet for Patients: EntrepreneurPulse.com.au  Fact Sheet for Healthcare Providers: IncredibleEmployment.be  This test is not yet approved or cleared by the [place] FDA and  has been authorized for detection and/or diagnosis of SARS-CoV-2 by FDA under an Emergency Use Authorization (EUA).  This EUA will remain in effect (meaning this test ca n be used) for the duration of  the COVID-19 declaration under Section 564(b)(1) of the Act, 21 U.S.C. section 360bbb-3(b)(1), unless the authorization is terminated or revoked sooner.     Influenza A by PCR NEGATIVE NEGATIVE Final   Influenza B by PCR NEGATIVE NEGATIVE Final    Comment: (NOTE) The Xpert Xpress SARS-CoV-2/FLU/RSV plus assay is intended as an aid in the diagnosis of influenza from Nasopharyngeal swab specimens and should not be used as a sole basis for treatment. Nasal washings and aspirates are unacceptable for Xpert Xpress SARS-CoV-2/FLU/RSV testing.  Fact Sheet for Patients: EntrepreneurPulse.com.au  Fact Sheet for Healthcare Providers: IncredibleEmployment.be  This test is not yet approved or cleared by the [place] FDA and has been authorized for detection and/or diagnosis of SARS-CoV-2 by FDA under an Emergency Use Authorization  (EUA). This EUA will remain in effect (meaning this test can be used) for the duration of the COVID-19 declaration under Section 564(b)(1) of the Act, 21 U.S.C. section 360bbb-3(b)(1), unless the authorization is terminated or revoked.  Performed at [facility], [place] [address], [place], [place] [address]       Radiology Studies: No results found.  Scheduled Meds: . atorvastatin  80 mg Oral Daily  . docusate sodium  100 mg Oral Daily  . DULoxetine  40 mg Oral Daily  . enoxaparin (LOVENOX) injection  40 mg Subcutaneous Q24H  . ferrous sulfate  325 mg Oral Q breakfast  .  furosemide  80 mg Oral Daily  . insulin aspart  0-5 Units Subcutaneous QHS  . insulin aspart  0-6 Units Subcutaneous TID WC  . Ipratropium-Albuterol  1 puff Inhalation Q6H  . levETIRAcetam  500 mg Oral BID  . losartan  25 mg Oral Daily  . melatonin  5 mg Oral QHS  . memantine  10 mg Oral BID  . methylPREDNISolone (SOLU-MEDROL) injection  60 mg Intravenous Q12H  . mirtazapine  7.5 mg Oral QHS  . mometasone-formoterol  2 puff Inhalation BID  . polyethylene glycol  17 g Oral Daily  . pregabalin  150 mg Oral BID  . senna-docusate  2 tablet Oral QHS  . traZODone  150 mg Oral QHS   Continuous Infusions: . dextrose 75 mL/hr at 03/01/20 0854  . remdesivir 100 mg in NS 100 mL 100 mg (03/01/20 0906)     LOS: 3 days   Time spent: 25 minutes.  Patrecia Pour, MD [facility] [email or web address] 03/01/2020, 5:51 PM

## 2020-03-01 NOTE — Evaluation (Signed)
Occupational Therapy Evaluation Patient Details Name: [name] MRN: [id number] DOB: 12-Jun-1950 Today's Date: 03/01/2020    History of Present Illness 70 y.o. male with a history of CVA with hemiparesis, seizure disorder, polysubstance and alcohol abuse, COPD on 2L O2 at baseline, dementia, NSCLC s/p SBRT who presented from SNF due to hypoxia, found to have positive SARS-CoV-2 testing and bilateral infiltrates on CXR. SCr up to 1.3 from baseline around 0.5 as well. Remdesivir and steroids provided and the patient admitted requiring 6L O2.   Clinical Impression   [name] is a 70 year old man admitted to hospital with hypoxia and found to have COVID-19. ON evaluation he presents with dense hemiplegia of left side without functional movement of RUE or RLE and confusion. Patient found on RA and without complains of shortness of breath. Patient has history of dementia and only oriented to self today and an unreliable historian though he does suggest that he is transferred via hoyer lift at the local SNF that he resides at. Patient required supervision, set up and verbal/tactile cues to perform feeding and grooming. Max x 2 for bed transfers. Patient max-total assist for all other ADLs. Patient appears to be at this baseline and has no further OT needs.     Follow Up Recommendations  No OT follow up    Equipment Recommendations  None recommended by OT    Recommendations for Other Services       Precautions / Restrictions Precautions Precautions: Fall Precaution Comments: L UE hemiplegia, L LE hemiparesis      Mobility Bed Mobility Overal bed mobility: Needs Assistance Bed Mobility: Supine to Sit;Sit to Supine     Supine to sit: Max assist;+2 for physical assistance;+2 for safety/equipment;HOB elevated Sit to supine: +2 for physical assistance;+2 for safety/equipment;Max assist   General bed mobility comments: Transferred to side of bed patinet assisting with RUE -  requiring max assist and +2 assistance. Max assist to return to supine.    Transfers                      Balance Overall balance assessment: Needs assistance Sitting-balance support: Feet supported;Single extremity supported Sitting balance-Leahy Scale: Poor Sitting balance - Comments: Patient holding onto bed rail with RUE...advanced to min guard at side of bed.                                   ADL either performed or assessed with clinical judgement   ADL Overall ADL's : Needs assistance/impaired Eating/Feeding: Set up;Cueing for sequencing;Supervision/ safety Eating/Feeding Details (indicate cue type and reason): Doesn't initiate self feeding. Will drink from cup when placed in hand. Patient able to bring hand to mouth, bring food to mouth when placed in hand. Grooming: Set up;Cueing for sequencing;Supervision/safety Grooming Details (indicate cue type and reason): Tactile cue to initiate task Upper Body Bathing: Maximal assistance;Bed level   Lower Body Bathing: Total assistance;Bed level   Upper Body Dressing : Maximal assistance;Bed level   Lower Body Dressing: Total assistance;Bed level       Toileting- Clothing Manipulation and Hygiene: Total assistance;Bed level               Vision   Vision Assessment?: No apparent visual deficits     Perception     Praxis      Pertinent Vitals/Pain Pain Assessment: No/denies pain     Hand Dominance  Right   Extremity/Trunk Assessment Upper Extremity Assessment Upper Extremity Assessment: RUE deficits/detail;LUE deficits/detail RUE Deficits / Details: WFL ROM and Strength of RUE LUE Deficits / Details: hx CVA, L hemiplegia, did not exhibit active shoulder ROM, limited PROM of elbow, wrs and fingers   Lower Extremity Assessment Lower Extremity Assessment: Defer to PT evaluation   Cervical / Trunk Assessment Cervical / Trunk Assessment: Normal   Communication Communication Communication:  No difficulties   Cognition Arousal/Alertness: Awake/alert Behavior During Therapy: WFL for tasks assessed/performed Overall Cognitive Status: No family/caregiver present to determine baseline cognitive functioning                                 General Comments: hx dementia; patient able to state name but doesn't know where he is.   General Comments       Exercises     Shoulder Instructions      Home Living Family/patient expects to be discharged to:: Skilled nursing facility                                        Prior Functioning/Environment Level of Independence: Needs assistance  Gait / Transfers Assistance Needed: Appears non ambulatory and that he may  need a hoyer to transer to ADL's / Homemaking Assistance Needed: Appears total care. Communication / Swallowing Assistance Needed: HOH, stuttering speech at times Comments: Pt poor historian, appears to require assist even possibly hoyer lift for OOB        OT Problem List:        OT Treatment/Interventions:      OT Goals(Current goals can be found in the care plan section) Acute Rehab OT Goals OT Goal Formulation: All assessment and education complete, DC therapy  OT Frequency:     Barriers to D/C:            Co-evaluation              AM-PAC OT "6 Clicks" Daily Activity     Outcome Measure Help from another person eating meals?: A Little Help from another person taking care of personal grooming?: A Little Help from another person toileting, which includes using toliet, bedpan, or urinal?: Total Help from another person bathing (including washing, rinsing, drying)?: A Lot Help from another person to put on and taking off regular upper body clothing?: A Lot Help from another person to put on and taking off regular lower body clothing?: Total 6 Click Score: 12   End of Session Nurse Communication: Mobility status (may need supervision feeding.)  Activity Tolerance:  Patient tolerated treatment well Patient left: in bed;with call bell/phone within reach;with bed alarm set  OT Visit Diagnosis: Other (comment) (COVID)                Time: 1001-1017 OT Time Calculation (min): 16 min Charges:  OT General Charges $OT Visit: 1 Visit OT Evaluation $OT Eval Moderate Complexity: 1 Mod  Idan Prime, OTR/L [place]  Office [phone] Pager: [place] 03/01/2020, 10:42 AM

## 2020-03-01 NOTE — Care Management Important Message (Signed)
Important Message  Patient Details IM Letter placed in Patient's door [place]. Name: ZANDEN COLVER MRN: [id number] Date of Birth: 1950/04/26   Medicare Important Message Given:  Yes     Kerin Salen 03/01/2020, 10:58 AM

## 2020-03-01 NOTE — TOC Progression Note (Signed)
Transition of Care [facility]) - Progression Note    Patient Details  Name: [name] MRN: [id number] Date of Birth: 02-Aug-1950  [facility] CM/SW Contact  Ross Ludwig, [place] Phone Number: 03/01/2020, 12:21 PM  Clinical Narrative:     CSW spoke to patient's POA [name] and she is agreeable to having patient return once he is medically ready for discharge.  CSW spoke to [place] at [facility], patient can return over the weekend if he is ready and will not need a new Covid test.  CSW was informed by patient's POA that she has a meeting with [facility] on Tuesday the 25th to discuss the concerns that she has with the facility.  CSW updated attending physician, CSW to continue to follow patient's progress throughout discharge planning.     Expected Discharge Plan: Skilled Nursing Facility Barriers to Discharge: Continued Medical Work up,SNF Covid  Expected Discharge Plan and Services Expected Discharge Plan: [place] In-house Referral: Clinical Social Work   Post Acute Care Choice: [place] Living arrangements for the past 2 months: [place]                                       Social Determinants of Health (SDOH) Interventions    Readmission Risk Interventions Readmission Risk Prevention Plan 08/23/2018  Transportation Screening Complete  PCP or Specialist Appt within 3-5 Days Complete  HRI or [place] Not Complete  Palliative Care Screening Complete  Medication Review (RN Care Manager) Referral to Pharmacy  Some recent data might be hidden

## 2020-03-02 LAB — BASIC METABOLIC PANEL
Anion gap: 7 (ref 5–15)
BUN: 27 mg/dL — ABNORMAL HIGH (ref 8–23)
CO2: 27 mmol/L (ref 22–32)
Calcium: 9.4 mg/dL (ref 8.9–10.3)
Chloride: 112 mmol/L — ABNORMAL HIGH (ref 98–111)
Creatinine, Ser: 0.52 mg/dL — ABNORMAL LOW (ref 0.61–1.24)
GFR, Estimated: >60 mL/min (ref >60)
Glucose, Bld: 250 mg/dL — ABNORMAL HIGH (ref 70–99)
Potassium: 4.2 mmol/L (ref 3.5–5.1)
Sodium: 146 mmol/L — ABNORMAL HIGH (ref 135–145)

## 2020-03-02 LAB — GLUCOSE, CAPILLARY
Glucose-Capillary: 233 mg/dL — ABNORMAL HIGH (ref 70–99)
Glucose-Capillary: 210 mg/dL — ABNORMAL HIGH (ref 70–99)

## 2020-03-02 LAB — C-REACTIVE PROTEIN: CRP: 0.6 mg/dL (ref <1.0)

## 2020-03-02 MED ORDER — DEXAMETHASONE 6 MG PO TABS: 6.0000 mg | ORAL_TABLET | Freq: Every day | ORAL | 0 refills | Status: AC

## 2020-03-02 NOTE — Progress Notes (Signed)
Report called to [facility] at [facility]. Pt discharged to facility in stable condition. SRP, RN

## 2020-03-02 NOTE — Discharge Summary (Signed)
Physician Discharge Summary  [name] LZJ:[id number] DOB: 1950-11-04 DOA: 02/26/2020  PCP: Garwin Brothers, MD  Admit date: 02/26/2020 Discharge date: 03/02/2020  Admitted From: SNF Disposition: SNF   Recommendations for Outpatient Follow-up:  1. Follow up with PCP in 1-2 weeks. Recheck BMP in next week. 2. Follow up with oncology 3. Follow up left pleural effusion with imaging suggested.  Home Health: N/A Equipment/Devices: None Discharge Condition: Stable CODE STATUS: DNR Diet recommendation: Heart healthy-carb-modified  Brief/Interim Summary: [name] is a 70 y.o. male with a history of CVA with hemiparesis, seizure disorder, polysubstance and alcohol abuse, COPD on 2L O2 at baseline, dementia, NSCLC s/p SBRT who presented from SNF due to hypoxia, found to have positive SARS-CoV-2 testing and bilateral infiltrates on CXR. SCr up to 1.3 from baseline around 0.5 as well. Remdesivir and steroids provided and the patient admitted requiring 6L O2. Hypoxia has improved, creatinine has returned to baseline and sodium has come back to his baseline ~146. CRP is normal, and he is stable for transfer back to facility with plan to complete a 10 day course of steroids.  Discharge Diagnoses:  Principal Problem:   Acute respiratory disease due to COVID-19 virus Active Problems:   COPD (chronic obstructive pulmonary disease) (HCC)   Chronic diastolic congestive heart failure (HCC)   History of DVT (deep vein thrombosis)   Essential hypertension   History of stroke   Coronary artery disease involving native coronary artery of native heart without angina pectoris   Dementia due to another medical condition, without behavioral disturbance ([name])   Adenocarcinoma of right lung, stage 1 (HCC)   Pressure injury of skin   Acute on chronic respiratory failure with hypoxia (HCC)   Pleural effusion, left   Renal insufficiency  Acute on chronic hypoxic respiratory failure due to covid-19  pneumonia on COPD without exacerbation: Reportedly on 2L O2 at baseline and has weaned to room air at rest. Can continue 2L as needed. Completed 5 days of remdesivir on day of discharge.  - Continue decadron x4 more days after discharge. CRP normalized.  - Bronchopneumonia suggested on CT though WBC normal, afebrile and PCT 0.11 without antibiotics. Monitoring off abx.  - Continue bronchodilators  AKI: Improved - Can restart lasix, ARB with elevated BP.   Hypernatremia: Acute on chronic when not taking as much free water. When this was encouraged, the patient has tolerated sufficient intake to improve sodium level.  - Continue encouraging free water po.   Hypokalemia: Resolved.  Chronic HFpEF:  - Monitor volume status, restart home meds.  Lung cancer stage Ia non-small cell involving the right upper lobe: Oncology has been notified.  Thrombocytopenia: Likely due to covid, resolved.  Essential hypertension: - Restart ARB and Lasix.  Seizure disorder: - Continue keppra.  Left pleural effusion: Repeat CT scan showing this with atelectasis.  - Repeat imaging after convalescence suggested.  Dementia due to another medical condition, without behavioral disturbance: Continue home meds  CAD, aortic atherosclerosis: Noted.  RN Pressure Injury Documentation: Pressure Injury 02/27/20 Sacrum Mid Stage 3 -  Full thickness tissue loss. Subcutaneous fat may be visible but bone, tendon or muscle are NOT exposed. (Active)  02/27/20 0408  Location: Sacrum  Location Orientation: Mid  Staging: Stage 3 -  Full thickness tissue loss. Subcutaneous fat may be visible but bone, tendon or muscle are NOT exposed.  Wound Description (Comments):   Present on Admission: Yes   Discharge Instructions  Allergies as of 03/02/2020  Reactions   Darvocet [propoxyphene N-acetaminophen] Hives   Haldol [haloperidol Decanoate] Hives   Acetaminophen Nausea Only   Upset stomach, tolerates  Hydrocodone/APAP if taken with food   Metformin And Related Nausea And Vomiting   Norco [hydrocodone-acetaminophen] Nausea And Vomiting   Tolerates if taken with food      Medication List    STOP taking these medications   levofloxacin 750 MG tablet Commonly known as: LEVAQUIN   traMADol 50 MG tablet Commonly known as: ULTRAM     TAKE these medications   allopurinol 300 MG tablet Commonly known as: ZYLOPRIM Take 1 tablet (300 mg total) by mouth daily.   aluminum-magnesium hydroxide-simethicone 654-650-35 MG/5ML Susp Commonly known as: MAALOX Take 30 mLs by mouth every 4 (four) hours as needed (heartburn).   atorvastatin 80 MG tablet Commonly known as: LIPITOR Take 1 tablet (80 mg total) by mouth daily at 6 PM. What changed: when to take this   Cholecalciferol 25 MCG (1000 UT) tablet Take 1,000 Units by mouth daily.   Clobetasol Propionate 0.05 % shampoo Apply 1 application topically 2 (two) times a week. Tuesday and Friday to scalp and beard for eczema   clopidogrel 75 MG tablet Commonly known as: PLAVIX Take 1 tablet (75 mg total) by mouth daily.   dexamethasone 6 MG tablet Commonly known as: Decadron Take 1 tablet (6 mg total) by mouth daily for 4 days.   docusate sodium 100 MG capsule Commonly known as: COLACE Take 100 mg by mouth daily.   DULoxetine 20 MG capsule Commonly known as: CYMBALTA Take 40 mg by mouth daily.   eucerin cream Apply 1 application topically daily.   ferrous sulfate 325 (65 FE) MG tablet Take 1 tablet (325 mg total) by mouth daily with breakfast.   folic acid 1 MG tablet Commonly known as: FOLVITE Take 1 tablet (1 mg total) by mouth daily.   furosemide 40 MG tablet Commonly known as: LASIX Take 80 mg by mouth daily.   guaiFENesin-dextromethorphan 100-10 MG/5ML syrup Commonly known as: ROBITUSSIN DM Take 5 mLs by mouth every 6 (six) hours as needed for cough.   ipratropium-albuterol 0.5-2.5 (3) MG/3ML Soln Commonly known  as: DUONEB Inhale 3 mLs into the lungs every 6 (six) hours.   levETIRAcetam 500 MG tablet Commonly known as: Keppra Take 1 tablet (500 mg total) by mouth 2 (two) times daily.   losartan 25 MG tablet Commonly known as: COZAAR Take 1 tablet (25 mg total) by mouth daily. What changed:   when to take this  additional instructions   melatonin 5 MG Tabs Take 5 mg by mouth at bedtime.   memantine 10 MG tablet Commonly known as: NAMENDA Take 1 tablet (10 mg total) by mouth 2 (two) times daily.   mirtazapine 7.5 MG tablet Commonly known as: REMERON Take 7.5 mg by mouth at bedtime.   polyethylene glycol 17 g packet Commonly known as: MIRALAX / GLYCOLAX Take 17 g by mouth daily.   potassium chloride SA 20 MEQ tablet Commonly known as: KLOR-CON Take 20 mEq by mouth daily.   pregabalin 150 MG capsule Commonly known as: LYRICA Take 1 capsule (150 mg total) by mouth 2 (two) times daily. What changed: when to take this   senna-docusate 8.6-50 MG tablet Commonly known as: Senokot-S Take 2 tablets by mouth at bedtime.   tiZANidine 2 MG tablet Commonly known as: ZANAFLEX Take 2 mg by mouth 3 (three) times daily.   traZODone 100 MG tablet Commonly known as:  DESYREL Take 150 mg by mouth at bedtime.   triamcinolone 0.1 % Commonly known as: KENALOG Apply 1 application topically every 12 (twelve) hours. Legs, itching   Wixela Inhub 250-50 MCG/DOSE Aepb Generic drug: Fluticasone-Salmeterol Inhale 1-2 puffs into the lungs daily. What changed: how much to take   zinc oxide 20 % ointment Apply 1 application topically 2 (two) times daily. Day and night shift to diaper area       Follow-up Information    Garwin Brothers, MD. Schedule an appointment as soon as possible for a visit.   Specialty: Internal Medicine Contact information: [address] [place] [address] [phone]              Allergies  Allergen Reactions  . Darvocet [Propoxyphene N-Acetaminophen]  Hives  . Haldol [Haloperidol Decanoate] Hives  . Acetaminophen Nausea Only    Upset stomach, tolerates Hydrocodone/APAP if taken with food  . Metformin And Related Nausea And Vomiting  . Norco [Hydrocodone-Acetaminophen] Nausea And Vomiting    Tolerates if taken with food    Consultations:  None  Procedures/Studies: CT CHEST WO CONTRAST  Result Date: 02/27/2020 CLINICAL DATA:  70 year old male with history of dyspnea. Chronic chest wall or pleural disease suspected. EXAM: CT CHEST WITHOUT CONTRAST TECHNIQUE: Multidetector CT imaging of the chest was performed following the standard protocol without IV contrast. COMPARISON:  Chest CT 04/20/2019. FINDINGS: Cardiovascular: Heart size is normal. Trace amount of pericardial fluid and/or thickening most evident adjacent to the left ventricle, decreased compared to the prior study, and unlikely to be of hemodynamic significance at this time. No associated pericardial calcification. There is aortic atherosclerosis, as well as atherosclerosis of the great vessels of the mediastinum and the coronary arteries, including calcified atherosclerotic plaque in the left main, left anterior descending, left circumflex and right coronary arteries. Calcifications of the aortic valve. Mediastinum/Nodes: No pathologically enlarged mediastinal or hilar lymph nodes. Esophagus is unremarkable in appearance. No axillary lymphadenopathy. Lungs/Pleura: Small left pleural effusion lying dependently. Patchy areas of airspace consolidation throughout the right upper and right lower lobes. Areas of atelectasis are also noted in the lower lobes of the lungs bilaterally, with near complete atelectasis of the left lower lobe. No definite suspicious appearing pulmonary nodules or masses are noted. Upper Abdomen: Surgical clips adjacent to the gastroesophageal junction. Diffuse low attenuation throughout the visualized hepatic parenchyma, indicative of a background of hepatic steatosis.  Status post cholecystectomy. Musculoskeletal: There are no aggressive appearing lytic or blastic lesions noted in the visualized portions of the skeleton. IMPRESSION: 1. Multilobar bronchopneumonia in the right lung most evident in the right upper lobe and right lower lobe. 2. Small chronic left pleural effusion lying dependently with near complete atelectasis of the left lower lobe, and some subsegmental atelectasis in the right lower lobe. 3. Aortic atherosclerosis, in addition to left main and 3 vessel coronary artery disease. Please note that although the presence of coronary artery calcium documents the presence of coronary artery disease, the severity of this disease and any potential stenosis cannot be assessed on this non-gated CT examination. Assessment for potential risk factor modification, dietary therapy or pharmacologic therapy may be warranted, if clinically indicated. 4. There are calcifications of the aortic valve. Echocardiographic correlation for evaluation of potential valvular dysfunction may be warranted if clinically indicated. 5. Hepatic steatosis. Aortic Atherosclerosis (ICD10-I70.0) and Emphysema (ICD10-J43.9). Electronically Signed   By: Vinnie Langton M.D.   On: 02/27/2020 09:38   DG Chest Portable 1 View  Result Date: 02/27/2020 CLINICAL DATA:  Hypoxia and cough EXAM: PORTABLE CHEST 1 VIEW COMPARISON:  October 27, 2018 FINDINGS: The heart size and mediastinal contours are unchanged with mild cardiomegaly. Again noted is diffusely increased interstitial markings seen throughout the right lung which appear worsened from the prior exam with patchy airspace opacity in the right mid lung. A small left effusion is seen. The visualized skeletal structures are unremarkable. IMPRESSION: Interstitial patchy airspace opacities throughout the right lung which appears worsened from the prior exam could be due to worsening chronic lung changes and/or superimposed pneumonia. Left pleural  effusion Electronically Signed   By: Prudencio Pair M.D.   On: 02/27/2020 00:30   Subjective: No new complaints or overnight events. Eating well. Breathing well. No pain reported.  Discharge Exam: Vitals:   03/01/20 1332 03/01/20 2041  BP: (!) 126/59 (!) 143/83  Pulse: 85 79  Resp: 16 20  Temp: (!) 97.5 F (36.4 C) 98.2 F (36.8 C)  SpO2: 95% 93%   General: Pt is resting quietly, not in acute distress Respiratory: Nonlabored, normal rate, no wheezing  Labs: BNP (last 3 results) Recent Labs    02/27/20 0633  BNP 81.1   Basic Metabolic Panel: Recent Labs  Lab 02/28/20 0433 02/29/20 0442 03/01/20 0444 03/01/20 1646 03/02/20 0316  NA 147* 148* 152* 153* 146*  K 3.0* 3.4* 3.5 3.7 4.2  CL 111 113* 116* 117* 112*  CO2 26 23 24 30 27   GLUCOSE 166* 166* 226* 258* 250*  BUN 48* 48* 36* 30* 27*  CREATININE 0.68 0.76 0.63 0.72 0.52*  CALCIUM 9.1 9.4 9.5 9.5 9.4  MG 2.4  --   --   --   --   PHOS 2.9  --   --   --   --    Liver Function Tests: Recent Labs  Lab 02/27/20 0009 02/28/20 0433 02/29/20 0442 03/01/20 0444  AST 40 29 33 31  ALT 20 19 18 21   ALKPHOS 59 52 50 57  BILITOT 0.9 0.6 0.6 0.6  PROT 7.0 6.2* 6.2* 6.2*  ALBUMIN 3.5 3.1* 3.2* 3.3*   No results for input(s): LIPASE, AMYLASE in the last 168 hours. No results for input(s): AMMONIA in the last 168 hours. CBC: Recent Labs  Lab 02/27/20 0009 02/28/20 0433 02/29/20 0442  WBC 5.5 4.6 4.7  NEUTROABS 4.2 3.6 3.9  HGB 13.3 12.7* 12.6*  HCT 43.8 41.2 40.4  MCV 95.8 93.2 92.2  PLT 142* 131* 154   Cardiac Enzymes: No results for input(s): CKTOTAL, CKMB, CKMBINDEX, TROPONINI in the last 168 hours. BNP: Invalid input(s): POCBNP CBG: Recent Labs  Lab 02/29/20 2031 03/01/20 0824 03/01/20 1115 03/01/20 1659 03/01/20 2038  GLUCAP 255* 191* 217* 201* 192*   D-Dimer Recent Labs    02/29/20 0442  DDIMER 1.23*   Hgb A1c No results for input(s): HGBA1C in the last 72 hours. Lipid Profile No  results for input(s): CHOL, HDL, LDLCALC, TRIG, CHOLHDL, LDLDIRECT in the last 72 hours. Thyroid function studies No results for input(s): TSH, T4TOTAL, T3FREE, THYROIDAB in the last 72 hours.  Invalid input(s): FREET3 Anemia work up No results for input(s): VITAMINB12, FOLATE, FERRITIN, TIBC, IRON, RETICCTPCT in the last 72 hours. Urinalysis    Component Value Date/Time   COLORURINE YELLOW 08/22/2018 1900   APPEARANCEUR HAZY (A) 08/22/2018 1900   LABSPEC 1.044 (H) 08/22/2018 1900   PHURINE 5.0 08/22/2018 1900   GLUCOSEU NEGATIVE 08/22/2018 1900   HGBUR NEGATIVE 08/22/2018 1900   BILIRUBINUR  NEGATIVE 08/22/2018 1900   KETONESUR NEGATIVE 08/22/2018 1900   PROTEINUR 30 (A) 08/22/2018 1900   UROBILINOGEN 0.2 08/19/2014 2103   NITRITE NEGATIVE 08/22/2018 1900   LEUKOCYTESUR NEGATIVE 08/22/2018 1900    Microbiology Recent Results (from the past 240 hour(s))  Resp Panel by RT-PCR (Flu A&B, Covid) Nasopharyngeal Swab     Status: Abnormal   Collection Time: 02/27/20 12:10 AM   Specimen: Nasopharyngeal Swab; Nasopharyngeal(NP) swabs in vial transport medium  Result Value Ref Range Status   SARS Coronavirus 2 by RT PCR POSITIVE (A) NEGATIVE Final    Comment: RESULT CALLED TO, READ BACK BY AND VERIFIED WITH: MULDOON S.1.18.22 @ 0236 BY MECIAL J. (NOTE) SARS-CoV-2 target nucleic acids are DETECTED.  The SARS-CoV-2 RNA is generally detectable in upper respiratory specimens during the acute phase of infection. Positive results are indicative of the presence of the identified virus, but do not rule out bacterial infection or co-infection with other pathogens not detected by the test. Clinical correlation with patient history and other diagnostic information is necessary to determine patient infection status. The expected result is Negative.  Fact Sheet for Patients: EntrepreneurPulse.com.au  Fact Sheet for Healthcare  Providers: IncredibleEmployment.be  This test is not yet approved or cleared by the [place] FDA and  has been authorized for detection and/or diagnosis of SARS-CoV-2 by FDA under an Emergency Use Authorization (EUA).  This EUA will remain in effect (meaning this test ca n be used) for the duration of  the COVID-19 declaration under Section 564(b)(1) of the Act, 21 U.S.C. section 360bbb-3(b)(1), unless the authorization is terminated or revoked sooner.     Influenza A by PCR NEGATIVE NEGATIVE Final   Influenza B by PCR NEGATIVE NEGATIVE Final    Comment: (NOTE) The Xpert Xpress SARS-CoV-2/FLU/RSV plus assay is intended as an aid in the diagnosis of influenza from Nasopharyngeal swab specimens and should not be used as a sole basis for treatment. Nasal washings and aspirates are unacceptable for Xpert Xpress SARS-CoV-2/FLU/RSV testing.  Fact Sheet for Patients: EntrepreneurPulse.com.au  Fact Sheet for Healthcare Providers: IncredibleEmployment.be  This test is not yet approved or cleared by the [place] FDA and has been authorized for detection and/or diagnosis of SARS-CoV-2 by FDA under an Emergency Use Authorization (EUA). This EUA will remain in effect (meaning this test can be used) for the duration of the COVID-19 declaration under Section 564(b)(1) of the Act, 21 U.S.C. section 360bbb-3(b)(1), unless the authorization is terminated or revoked.  Performed at [facility], [place] [address], [place], [place] [address]     Time coordinating discharge: Approximately 40 minutes  Patrecia Pour, MD  [facility] 03/02/2020, 7:11 AM

## 2020-03-02 NOTE — Plan of Care (Signed)
  Problem: Education: Goal: Knowledge of risk factors and measures for prevention of condition will improve 03/02/2020 0005 by Tula Nakayama, RN Outcome: Progressing 03/02/2020 0004 by Glenna Fellows D, RN Outcome: Progressing   Problem: Coping: Goal: Psychosocial and spiritual needs will be supported 03/02/2020 0005 by Tula Nakayama, RN Outcome: Progressing 03/02/2020 0004 by Glenna Fellows D, RN Outcome: Progressing   Problem: Respiratory: Goal: Will maintain a patent airway 03/02/2020 0005 by Glenna Fellows D, RN Outcome: Progressing 03/02/2020 0004 by Tula Nakayama, RN Outcome: Progressing Goal: Complications related to the disease process, condition or treatment will be avoided or minimized 03/02/2020 0005 by Tula Nakayama, RN Outcome: Progressing 03/02/2020 0004 by Glenna Fellows D, RN Outcome: Progressing   Problem: Education: Goal: Knowledge of General Education information will improve Description: Including pain rating scale, medication(s)/side effects and non-pharmacologic comfort measures Outcome: Progressing   Problem: Health Behavior/Discharge Planning: Goal: Ability to manage health-related needs will improve Outcome: Progressing   Problem: Clinical Measurements: Goal: Ability to maintain clinical measurements within normal limits will improve Outcome: Progressing Goal: Will remain free from infection Outcome: Progressing Goal: Diagnostic test results will improve Outcome: Progressing Goal: Respiratory complications will improve Outcome: Progressing   Problem: Activity: Goal: Risk for activity intolerance will decrease Outcome: Progressing   Problem: Nutrition: Goal: Adequate nutrition will be maintained Outcome: Progressing   Problem: Coping: Goal: Level of anxiety will decrease Outcome: Progressing   Problem: Elimination: Goal: Will not experience complications related to bowel motility Outcome: Progressing Goal: Will not experience  complications related to urinary retention Outcome: Progressing   Problem: Pain Managment: Goal: General experience of comfort will improve Outcome: Progressing   Problem: Safety: Goal: Ability to remain free from injury will improve Outcome: Progressing   Problem: Skin Integrity: Goal: Risk for impaired skin integrity will decrease Outcome: Progressing

## 2020-03-02 NOTE — TOC Transition Note (Signed)
Transition of Care [facility]) - CM/SW Discharge Note   Patient Details  Name: ELEX MAINWARING MRN: [id number] Date of Birth: [birth year]  [facility]) CM/SW Contact:  Lia Hopping, [place] Phone Number: 03/02/2020, 10:00 AM   Clinical Narrative:    CSW confirm [facility] ready to accept the patient.  D/C Summary faxed CSW notified pt. [name] Nurse call report to: [phone] Room 130 PTAR to transport   Final next level of care: [place] Barriers to Discharge: Barriers Resolved   Patient Goals and CMS Choice Patient states their goals for this hospitalization and ongoing recovery are:: To return back to [place] Medicare.gov Compare Post Acute Care list provided to:: Patient Represenative (must comment) Choice offered to / list presented to : [place] / [place]  Discharge Placement              Patient [facility] bed at: [facility] Patient to be transferred to facility by: [place] Name of family member notified: [place] Patient and family notified of of transfer: 03/02/20  Discharge Plan and Services In-house Referral: Clinical Social Work   Post Acute Care Choice: [place]                               Social Determinants of Health (SDOH) Interventions     Readmission Risk Interventions Readmission Risk Prevention Plan 08/23/2018  Transportation Screening Complete  PCP or Specialist Appt within 3-5 Days Complete  HRI or [place] Not Complete  Palliative Care Screening Complete  Medication Review (RN Care Manager) Referral to Pharmacy  Some recent data might be hidden

## 2020-03-02 NOTE — Plan of Care (Signed)
  Problem: Education: Goal: Knowledge of risk factors and measures for prevention of condition will improve Outcome: Progressing   Problem: Coping: Goal: Psychosocial and spiritual needs will be supported Outcome: Progressing   

## 2020-03-04 DIAGNOSIS — I69952 Hemiplegia and hemiparesis following unspecified cerebrovascular disease affecting left dominant side: Secondary | ICD-10-CM | POA: Diagnosis not present

## 2020-03-04 DIAGNOSIS — J90 Pleural effusion, not elsewhere classified: Secondary | ICD-10-CM | POA: Diagnosis not present

## 2020-03-04 DIAGNOSIS — G4701 Insomnia due to medical condition: Secondary | ICD-10-CM | POA: Diagnosis not present

## 2020-03-04 DIAGNOSIS — I2510 Atherosclerotic heart disease of native coronary artery without angina pectoris: Secondary | ICD-10-CM | POA: Diagnosis not present

## 2020-03-04 DIAGNOSIS — C3491 Malignant neoplasm of unspecified part of right bronchus or lung: Secondary | ICD-10-CM | POA: Diagnosis not present

## 2020-03-04 DIAGNOSIS — J449 Chronic obstructive pulmonary disease, unspecified: Secondary | ICD-10-CM | POA: Diagnosis not present

## 2020-03-04 DIAGNOSIS — L89150 Pressure ulcer of sacral region, unstageable: Secondary | ICD-10-CM | POA: Diagnosis not present

## 2020-03-04 DIAGNOSIS — G2581 Restless legs syndrome: Secondary | ICD-10-CM | POA: Diagnosis not present

## 2020-03-04 DIAGNOSIS — R911 Solitary pulmonary nodule: Secondary | ICD-10-CM | POA: Diagnosis not present

## 2020-03-04 DIAGNOSIS — M6281 Muscle weakness (generalized): Secondary | ICD-10-CM | POA: Diagnosis not present

## 2020-03-04 DIAGNOSIS — E785 Hyperlipidemia, unspecified: Secondary | ICD-10-CM | POA: Diagnosis not present

## 2020-03-04 DIAGNOSIS — I69354 Hemiplegia and hemiparesis following cerebral infarction affecting left non-dominant side: Secondary | ICD-10-CM | POA: Diagnosis not present

## 2020-03-04 DIAGNOSIS — G8194 Hemiplegia, unspecified affecting left nondominant side: Secondary | ICD-10-CM | POA: Diagnosis not present

## 2020-03-04 DIAGNOSIS — Z86718 Personal history of other venous thrombosis and embolism: Secondary | ICD-10-CM | POA: Diagnosis not present

## 2020-03-04 DIAGNOSIS — K219 Gastro-esophageal reflux disease without esophagitis: Secondary | ICD-10-CM | POA: Diagnosis not present

## 2020-03-04 DIAGNOSIS — R1312 Dysphagia, oropharyngeal phase: Secondary | ICD-10-CM | POA: Diagnosis not present

## 2020-03-04 DIAGNOSIS — J069 Acute upper respiratory infection, unspecified: Secondary | ICD-10-CM | POA: Diagnosis not present

## 2020-03-04 DIAGNOSIS — D649 Anemia, unspecified: Secondary | ICD-10-CM | POA: Diagnosis not present

## 2020-03-04 DIAGNOSIS — J9621 Acute and chronic respiratory failure with hypoxia: Secondary | ICD-10-CM | POA: Diagnosis not present

## 2020-03-04 DIAGNOSIS — U099 Post covid-19 condition, unspecified: Secondary | ICD-10-CM | POA: Diagnosis not present

## 2020-03-04 DIAGNOSIS — I10 Essential (primary) hypertension: Secondary | ICD-10-CM | POA: Diagnosis not present

## 2020-03-04 DIAGNOSIS — I5032 Chronic diastolic (congestive) heart failure: Secondary | ICD-10-CM | POA: Diagnosis not present

## 2020-03-04 DIAGNOSIS — Z8673 Personal history of transient ischemic attack (TIA), and cerebral infarction without residual deficits: Secondary | ICD-10-CM | POA: Diagnosis not present

## 2020-03-04 DIAGNOSIS — Z8616 Personal history of COVID-19: Secondary | ICD-10-CM | POA: Diagnosis not present

## 2020-03-07 DIAGNOSIS — I10 Essential (primary) hypertension: Secondary | ICD-10-CM | POA: Diagnosis not present

## 2020-03-07 DIAGNOSIS — J449 Chronic obstructive pulmonary disease, unspecified: Secondary | ICD-10-CM | POA: Diagnosis not present

## 2020-03-07 DIAGNOSIS — U099 Post covid-19 condition, unspecified: Secondary | ICD-10-CM | POA: Diagnosis not present

## 2020-03-07 DIAGNOSIS — L89150 Pressure ulcer of sacral region, unstageable: Secondary | ICD-10-CM | POA: Diagnosis not present

## 2020-03-07 DIAGNOSIS — I5032 Chronic diastolic (congestive) heart failure: Secondary | ICD-10-CM | POA: Diagnosis not present

## 2020-03-07 DIAGNOSIS — J9621 Acute and chronic respiratory failure with hypoxia: Secondary | ICD-10-CM | POA: Diagnosis not present

## 2020-03-14 DIAGNOSIS — L89150 Pressure ulcer of sacral region, unstageable: Secondary | ICD-10-CM | POA: Diagnosis not present

## 2020-03-15 DIAGNOSIS — Z0189 Encounter for other specified special examinations: Secondary | ICD-10-CM | POA: Diagnosis not present

## 2020-03-15 DIAGNOSIS — G4701 Insomnia due to medical condition: Secondary | ICD-10-CM | POA: Diagnosis not present

## 2020-03-16 DIAGNOSIS — R1312 Dysphagia, oropharyngeal phase: Secondary | ICD-10-CM | POA: Diagnosis not present

## 2020-03-16 DIAGNOSIS — M6281 Muscle weakness (generalized): Secondary | ICD-10-CM | POA: Diagnosis not present

## 2020-03-16 DIAGNOSIS — G8194 Hemiplegia, unspecified affecting left nondominant side: Secondary | ICD-10-CM | POA: Diagnosis not present

## 2020-03-16 DIAGNOSIS — J9621 Acute and chronic respiratory failure with hypoxia: Secondary | ICD-10-CM | POA: Diagnosis not present

## 2020-03-18 ENCOUNTER — Ambulatory Visit: Payer: Medicare Other | Admitting: Family Medicine

## 2020-03-18 DIAGNOSIS — R1312 Dysphagia, oropharyngeal phase: Secondary | ICD-10-CM | POA: Diagnosis not present

## 2020-03-18 DIAGNOSIS — J9621 Acute and chronic respiratory failure with hypoxia: Secondary | ICD-10-CM | POA: Diagnosis not present

## 2020-03-18 DIAGNOSIS — G8194 Hemiplegia, unspecified affecting left nondominant side: Secondary | ICD-10-CM | POA: Diagnosis not present

## 2020-03-18 DIAGNOSIS — M6281 Muscle weakness (generalized): Secondary | ICD-10-CM | POA: Diagnosis not present

## 2020-03-19 DIAGNOSIS — G8194 Hemiplegia, unspecified affecting left nondominant side: Secondary | ICD-10-CM | POA: Diagnosis not present

## 2020-03-19 DIAGNOSIS — R1312 Dysphagia, oropharyngeal phase: Secondary | ICD-10-CM | POA: Diagnosis not present

## 2020-03-19 DIAGNOSIS — J9621 Acute and chronic respiratory failure with hypoxia: Secondary | ICD-10-CM | POA: Diagnosis not present

## 2020-03-19 DIAGNOSIS — M6281 Muscle weakness (generalized): Secondary | ICD-10-CM | POA: Diagnosis not present

## 2020-03-20 DIAGNOSIS — M6281 Muscle weakness (generalized): Secondary | ICD-10-CM | POA: Diagnosis not present

## 2020-03-20 DIAGNOSIS — G8194 Hemiplegia, unspecified affecting left nondominant side: Secondary | ICD-10-CM | POA: Diagnosis not present

## 2020-03-20 DIAGNOSIS — R1312 Dysphagia, oropharyngeal phase: Secondary | ICD-10-CM | POA: Diagnosis not present

## 2020-03-20 DIAGNOSIS — J9621 Acute and chronic respiratory failure with hypoxia: Secondary | ICD-10-CM | POA: Diagnosis not present

## 2020-03-21 DIAGNOSIS — L98419 Non-pressure chronic ulcer of buttock with unspecified severity: Secondary | ICD-10-CM | POA: Diagnosis not present

## 2020-03-21 DIAGNOSIS — M6281 Muscle weakness (generalized): Secondary | ICD-10-CM | POA: Diagnosis not present

## 2020-03-21 DIAGNOSIS — R1312 Dysphagia, oropharyngeal phase: Secondary | ICD-10-CM | POA: Diagnosis not present

## 2020-03-21 DIAGNOSIS — L89154 Pressure ulcer of sacral region, stage 4: Secondary | ICD-10-CM | POA: Diagnosis not present

## 2020-03-21 DIAGNOSIS — J9621 Acute and chronic respiratory failure with hypoxia: Secondary | ICD-10-CM | POA: Diagnosis not present

## 2020-03-21 DIAGNOSIS — G8194 Hemiplegia, unspecified affecting left nondominant side: Secondary | ICD-10-CM | POA: Diagnosis not present

## 2020-03-22 DIAGNOSIS — M6281 Muscle weakness (generalized): Secondary | ICD-10-CM | POA: Diagnosis not present

## 2020-03-22 DIAGNOSIS — J9621 Acute and chronic respiratory failure with hypoxia: Secondary | ICD-10-CM | POA: Diagnosis not present

## 2020-03-22 DIAGNOSIS — G8194 Hemiplegia, unspecified affecting left nondominant side: Secondary | ICD-10-CM | POA: Diagnosis not present

## 2020-03-22 DIAGNOSIS — R1312 Dysphagia, oropharyngeal phase: Secondary | ICD-10-CM | POA: Diagnosis not present

## 2020-03-25 DIAGNOSIS — M6281 Muscle weakness (generalized): Secondary | ICD-10-CM | POA: Diagnosis not present

## 2020-03-25 DIAGNOSIS — J9621 Acute and chronic respiratory failure with hypoxia: Secondary | ICD-10-CM | POA: Diagnosis not present

## 2020-03-25 DIAGNOSIS — R1312 Dysphagia, oropharyngeal phase: Secondary | ICD-10-CM | POA: Diagnosis not present

## 2020-03-25 DIAGNOSIS — G8194 Hemiplegia, unspecified affecting left nondominant side: Secondary | ICD-10-CM | POA: Diagnosis not present

## 2020-03-26 DIAGNOSIS — G8194 Hemiplegia, unspecified affecting left nondominant side: Secondary | ICD-10-CM | POA: Diagnosis not present

## 2020-03-26 DIAGNOSIS — R1312 Dysphagia, oropharyngeal phase: Secondary | ICD-10-CM | POA: Diagnosis not present

## 2020-03-26 DIAGNOSIS — M6281 Muscle weakness (generalized): Secondary | ICD-10-CM | POA: Diagnosis not present

## 2020-03-26 DIAGNOSIS — J9621 Acute and chronic respiratory failure with hypoxia: Secondary | ICD-10-CM | POA: Diagnosis not present

## 2020-03-27 DIAGNOSIS — R1312 Dysphagia, oropharyngeal phase: Secondary | ICD-10-CM | POA: Diagnosis not present

## 2020-03-27 DIAGNOSIS — G8194 Hemiplegia, unspecified affecting left nondominant side: Secondary | ICD-10-CM | POA: Diagnosis not present

## 2020-03-27 DIAGNOSIS — J9621 Acute and chronic respiratory failure with hypoxia: Secondary | ICD-10-CM | POA: Diagnosis not present

## 2020-03-27 DIAGNOSIS — M6281 Muscle weakness (generalized): Secondary | ICD-10-CM | POA: Diagnosis not present

## 2020-03-28 DIAGNOSIS — G8194 Hemiplegia, unspecified affecting left nondominant side: Secondary | ICD-10-CM | POA: Diagnosis not present

## 2020-03-28 DIAGNOSIS — J9621 Acute and chronic respiratory failure with hypoxia: Secondary | ICD-10-CM | POA: Diagnosis not present

## 2020-03-28 DIAGNOSIS — R1312 Dysphagia, oropharyngeal phase: Secondary | ICD-10-CM | POA: Diagnosis not present

## 2020-03-28 DIAGNOSIS — M6281 Muscle weakness (generalized): Secondary | ICD-10-CM | POA: Diagnosis not present

## 2020-04-01 DIAGNOSIS — M6281 Muscle weakness (generalized): Secondary | ICD-10-CM | POA: Diagnosis not present

## 2020-04-01 DIAGNOSIS — R1312 Dysphagia, oropharyngeal phase: Secondary | ICD-10-CM | POA: Diagnosis not present

## 2020-04-01 DIAGNOSIS — G8194 Hemiplegia, unspecified affecting left nondominant side: Secondary | ICD-10-CM | POA: Diagnosis not present

## 2020-04-01 DIAGNOSIS — J9621 Acute and chronic respiratory failure with hypoxia: Secondary | ICD-10-CM | POA: Diagnosis not present

## 2020-04-02 DIAGNOSIS — G8190 Hemiplegia, unspecified affecting unspecified side: Secondary | ICD-10-CM | POA: Diagnosis not present

## 2020-04-02 DIAGNOSIS — M6281 Muscle weakness (generalized): Secondary | ICD-10-CM | POA: Diagnosis not present

## 2020-04-02 DIAGNOSIS — G8194 Hemiplegia, unspecified affecting left nondominant side: Secondary | ICD-10-CM | POA: Diagnosis not present

## 2020-04-02 DIAGNOSIS — R1312 Dysphagia, oropharyngeal phase: Secondary | ICD-10-CM | POA: Diagnosis not present

## 2020-04-02 DIAGNOSIS — R1310 Dysphagia, unspecified: Secondary | ICD-10-CM | POA: Diagnosis not present

## 2020-04-02 DIAGNOSIS — J9621 Acute and chronic respiratory failure with hypoxia: Secondary | ICD-10-CM | POA: Diagnosis not present

## 2020-04-02 DIAGNOSIS — L89154 Pressure ulcer of sacral region, stage 4: Secondary | ICD-10-CM | POA: Diagnosis not present

## 2020-04-03 DIAGNOSIS — Z7409 Other reduced mobility: Secondary | ICD-10-CM | POA: Diagnosis not present

## 2020-04-03 DIAGNOSIS — I69154 Hemiplegia and hemiparesis following nontraumatic intracerebral hemorrhage affecting left non-dominant side: Secondary | ICD-10-CM | POA: Diagnosis not present

## 2020-04-03 DIAGNOSIS — G8194 Hemiplegia, unspecified affecting left nondominant side: Secondary | ICD-10-CM | POA: Diagnosis not present

## 2020-04-03 DIAGNOSIS — M6281 Muscle weakness (generalized): Secondary | ICD-10-CM | POA: Diagnosis not present

## 2020-04-03 DIAGNOSIS — J9621 Acute and chronic respiratory failure with hypoxia: Secondary | ICD-10-CM | POA: Diagnosis not present

## 2020-04-03 DIAGNOSIS — R1312 Dysphagia, oropharyngeal phase: Secondary | ICD-10-CM | POA: Diagnosis not present

## 2020-04-03 DIAGNOSIS — G894 Chronic pain syndrome: Secondary | ICD-10-CM | POA: Diagnosis not present

## 2020-04-03 DIAGNOSIS — L22 Diaper dermatitis: Secondary | ICD-10-CM | POA: Diagnosis not present

## 2020-04-03 DIAGNOSIS — L89154 Pressure ulcer of sacral region, stage 4: Secondary | ICD-10-CM | POA: Diagnosis not present

## 2020-04-04 DIAGNOSIS — J9621 Acute and chronic respiratory failure with hypoxia: Secondary | ICD-10-CM | POA: Diagnosis not present

## 2020-04-04 DIAGNOSIS — G8194 Hemiplegia, unspecified affecting left nondominant side: Secondary | ICD-10-CM | POA: Diagnosis not present

## 2020-04-04 DIAGNOSIS — R1312 Dysphagia, oropharyngeal phase: Secondary | ICD-10-CM | POA: Diagnosis not present

## 2020-04-04 DIAGNOSIS — M6281 Muscle weakness (generalized): Secondary | ICD-10-CM | POA: Diagnosis not present

## 2020-04-05 DIAGNOSIS — G8194 Hemiplegia, unspecified affecting left nondominant side: Secondary | ICD-10-CM | POA: Diagnosis not present

## 2020-04-05 DIAGNOSIS — J9621 Acute and chronic respiratory failure with hypoxia: Secondary | ICD-10-CM | POA: Diagnosis not present

## 2020-04-05 DIAGNOSIS — I69154 Hemiplegia and hemiparesis following nontraumatic intracerebral hemorrhage affecting left non-dominant side: Secondary | ICD-10-CM | POA: Diagnosis not present

## 2020-04-05 DIAGNOSIS — Z7409 Other reduced mobility: Secondary | ICD-10-CM | POA: Diagnosis not present

## 2020-04-05 DIAGNOSIS — M6281 Muscle weakness (generalized): Secondary | ICD-10-CM | POA: Diagnosis not present

## 2020-04-05 DIAGNOSIS — L89154 Pressure ulcer of sacral region, stage 4: Secondary | ICD-10-CM | POA: Diagnosis not present

## 2020-04-05 DIAGNOSIS — G894 Chronic pain syndrome: Secondary | ICD-10-CM | POA: Diagnosis not present

## 2020-04-05 DIAGNOSIS — R1312 Dysphagia, oropharyngeal phase: Secondary | ICD-10-CM | POA: Diagnosis not present

## 2020-04-05 DIAGNOSIS — L22 Diaper dermatitis: Secondary | ICD-10-CM | POA: Diagnosis not present

## 2020-04-08 DIAGNOSIS — M6281 Muscle weakness (generalized): Secondary | ICD-10-CM | POA: Diagnosis not present

## 2020-04-08 DIAGNOSIS — R1312 Dysphagia, oropharyngeal phase: Secondary | ICD-10-CM | POA: Diagnosis not present

## 2020-04-08 DIAGNOSIS — G8194 Hemiplegia, unspecified affecting left nondominant side: Secondary | ICD-10-CM | POA: Diagnosis not present

## 2020-04-08 DIAGNOSIS — J9621 Acute and chronic respiratory failure with hypoxia: Secondary | ICD-10-CM | POA: Diagnosis not present

## 2020-04-09 DIAGNOSIS — M6281 Muscle weakness (generalized): Secondary | ICD-10-CM | POA: Diagnosis not present

## 2020-04-09 DIAGNOSIS — R1310 Dysphagia, unspecified: Secondary | ICD-10-CM | POA: Diagnosis not present

## 2020-04-09 DIAGNOSIS — G8190 Hemiplegia, unspecified affecting unspecified side: Secondary | ICD-10-CM | POA: Diagnosis not present

## 2020-04-09 DIAGNOSIS — L89154 Pressure ulcer of sacral region, stage 4: Secondary | ICD-10-CM | POA: Diagnosis not present

## 2020-04-09 DIAGNOSIS — J9621 Acute and chronic respiratory failure with hypoxia: Secondary | ICD-10-CM | POA: Diagnosis not present

## 2020-04-09 DIAGNOSIS — G8194 Hemiplegia, unspecified affecting left nondominant side: Secondary | ICD-10-CM | POA: Diagnosis not present

## 2020-04-10 DIAGNOSIS — M6281 Muscle weakness (generalized): Secondary | ICD-10-CM | POA: Diagnosis not present

## 2020-04-10 DIAGNOSIS — J9621 Acute and chronic respiratory failure with hypoxia: Secondary | ICD-10-CM | POA: Diagnosis not present

## 2020-04-10 DIAGNOSIS — G8194 Hemiplegia, unspecified affecting left nondominant side: Secondary | ICD-10-CM | POA: Diagnosis not present

## 2020-04-11 DIAGNOSIS — M6281 Muscle weakness (generalized): Secondary | ICD-10-CM | POA: Diagnosis not present

## 2020-04-11 DIAGNOSIS — G8194 Hemiplegia, unspecified affecting left nondominant side: Secondary | ICD-10-CM | POA: Diagnosis not present

## 2020-04-11 DIAGNOSIS — J9621 Acute and chronic respiratory failure with hypoxia: Secondary | ICD-10-CM | POA: Diagnosis not present

## 2020-04-12 ENCOUNTER — Encounter (HOSPITAL_COMMUNITY): Payer: Self-pay | Admitting: Pharmacy Technician

## 2020-04-12 ENCOUNTER — Emergency Department (HOSPITAL_COMMUNITY): Payer: Medicare Other

## 2020-04-12 ENCOUNTER — Inpatient Hospital Stay (HOSPITAL_COMMUNITY)
Admission: EM | Admit: 2020-04-12 | Discharge: 2020-04-18 | DRG: 377 | Disposition: A | Discharge status: Home Health | Payer: Medicare Other | Attending: Internal Medicine | Admitting: Internal Medicine

## 2020-04-12 DIAGNOSIS — S31000D Unspecified open wound of lower back and pelvis without penetration into retroperitoneum, subsequent encounter: Secondary | ICD-10-CM | POA: Diagnosis not present

## 2020-04-12 DIAGNOSIS — L89159 Pressure ulcer of sacral region, unspecified stage: Secondary | ICD-10-CM | POA: Diagnosis not present

## 2020-04-12 DIAGNOSIS — K3189 Other diseases of stomach and duodenum: Secondary | ICD-10-CM | POA: Diagnosis not present

## 2020-04-12 DIAGNOSIS — Z7951 Long term (current) use of inhaled steroids: Secondary | ICD-10-CM

## 2020-04-12 DIAGNOSIS — K921 Melena: Secondary | ICD-10-CM | POA: Diagnosis not present

## 2020-04-12 DIAGNOSIS — G4701 Insomnia due to medical condition: Secondary | ICD-10-CM | POA: Diagnosis not present

## 2020-04-12 DIAGNOSIS — M4628 Osteomyelitis of vertebra, sacral and sacrococcygeal region: Secondary | ICD-10-CM | POA: Diagnosis not present

## 2020-04-12 DIAGNOSIS — R195 Other fecal abnormalities: Secondary | ICD-10-CM | POA: Diagnosis not present

## 2020-04-12 DIAGNOSIS — J9691 Respiratory failure, unspecified with hypoxia: Secondary | ICD-10-CM | POA: Diagnosis not present

## 2020-04-12 DIAGNOSIS — J9621 Acute and chronic respiratory failure with hypoxia: Secondary | ICD-10-CM

## 2020-04-12 DIAGNOSIS — N3090 Cystitis, unspecified without hematuria: Secondary | ICD-10-CM | POA: Diagnosis not present

## 2020-04-12 DIAGNOSIS — R059 Cough, unspecified: Secondary | ICD-10-CM

## 2020-04-12 DIAGNOSIS — M8668 Other chronic osteomyelitis, other site: Secondary | ICD-10-CM | POA: Diagnosis not present

## 2020-04-12 DIAGNOSIS — E1169 Type 2 diabetes mellitus with other specified complication: Secondary | ICD-10-CM | POA: Diagnosis present

## 2020-04-12 DIAGNOSIS — R569 Unspecified convulsions: Secondary | ICD-10-CM | POA: Diagnosis not present

## 2020-04-12 DIAGNOSIS — Z66 Do not resuscitate: Secondary | ICD-10-CM | POA: Diagnosis not present

## 2020-04-12 DIAGNOSIS — M8660 Other chronic osteomyelitis, unspecified site: Secondary | ICD-10-CM | POA: Diagnosis not present

## 2020-04-12 DIAGNOSIS — F0280 Dementia in other diseases classified elsewhere without behavioral disturbance: Secondary | ICD-10-CM | POA: Diagnosis present

## 2020-04-12 DIAGNOSIS — L89150 Pressure ulcer of sacral region, unstageable: Secondary | ICD-10-CM | POA: Diagnosis not present

## 2020-04-12 DIAGNOSIS — J440 Chronic obstructive pulmonary disease with acute lower respiratory infection: Secondary | ICD-10-CM | POA: Diagnosis not present

## 2020-04-12 DIAGNOSIS — Z8616 Personal history of COVID-19: Secondary | ICD-10-CM

## 2020-04-12 DIAGNOSIS — Z993 Dependence on wheelchair: Secondary | ICD-10-CM

## 2020-04-12 DIAGNOSIS — F17210 Nicotine dependence, cigarettes, uncomplicated: Secondary | ICD-10-CM | POA: Diagnosis present

## 2020-04-12 DIAGNOSIS — L89154 Pressure ulcer of sacral region, stage 4: Secondary | ICD-10-CM | POA: Diagnosis present

## 2020-04-12 DIAGNOSIS — F32A Depression, unspecified: Secondary | ICD-10-CM | POA: Diagnosis present

## 2020-04-12 DIAGNOSIS — Z8249 Family history of ischemic heart disease and other diseases of the circulatory system: Secondary | ICD-10-CM

## 2020-04-12 DIAGNOSIS — Z86010 Personal history of colonic polyps: Secondary | ICD-10-CM

## 2020-04-12 DIAGNOSIS — L98429 Non-pressure chronic ulcer of back with unspecified severity: Secondary | ICD-10-CM | POA: Diagnosis present

## 2020-04-12 DIAGNOSIS — I951 Orthostatic hypotension: Secondary | ICD-10-CM | POA: Diagnosis present

## 2020-04-12 DIAGNOSIS — E7800 Pure hypercholesterolemia, unspecified: Secondary | ICD-10-CM

## 2020-04-12 DIAGNOSIS — L8990 Pressure ulcer of unspecified site, unspecified stage: Secondary | ICD-10-CM | POA: Diagnosis present

## 2020-04-12 DIAGNOSIS — F17290 Nicotine dependence, other tobacco product, uncomplicated: Secondary | ICD-10-CM | POA: Diagnosis present

## 2020-04-12 DIAGNOSIS — K5900 Constipation, unspecified: Secondary | ICD-10-CM | POA: Diagnosis not present

## 2020-04-12 DIAGNOSIS — B9620 Unspecified Escherichia coli [E. coli] as the cause of diseases classified elsewhere: Secondary | ICD-10-CM | POA: Diagnosis present

## 2020-04-12 DIAGNOSIS — I2510 Atherosclerotic heart disease of native coronary artery without angina pectoris: Secondary | ICD-10-CM | POA: Diagnosis present

## 2020-04-12 DIAGNOSIS — Z20822 Contact with and (suspected) exposure to covid-19: Secondary | ICD-10-CM | POA: Diagnosis not present

## 2020-04-12 DIAGNOSIS — E1142 Type 2 diabetes mellitus with diabetic polyneuropathy: Secondary | ICD-10-CM | POA: Diagnosis present

## 2020-04-12 DIAGNOSIS — S31000A Unspecified open wound of lower back and pelvis without penetration into retroperitoneum, initial encounter: Secondary | ICD-10-CM

## 2020-04-12 DIAGNOSIS — J90 Pleural effusion, not elsewhere classified: Secondary | ICD-10-CM | POA: Diagnosis not present

## 2020-04-12 DIAGNOSIS — I110 Hypertensive heart disease with heart failure: Secondary | ICD-10-CM | POA: Diagnosis present

## 2020-04-12 DIAGNOSIS — Z743 Need for continuous supervision: Secondary | ICD-10-CM | POA: Diagnosis not present

## 2020-04-12 DIAGNOSIS — D508 Other iron deficiency anemias: Secondary | ICD-10-CM | POA: Diagnosis not present

## 2020-04-12 DIAGNOSIS — Z7902 Long term (current) use of antithrombotics/antiplatelets: Secondary | ICD-10-CM

## 2020-04-12 DIAGNOSIS — R339 Retention of urine, unspecified: Secondary | ICD-10-CM | POA: Diagnosis not present

## 2020-04-12 DIAGNOSIS — Z9049 Acquired absence of other specified parts of digestive tract: Secondary | ICD-10-CM

## 2020-04-12 DIAGNOSIS — E876 Hypokalemia: Secondary | ICD-10-CM | POA: Diagnosis not present

## 2020-04-12 DIAGNOSIS — K922 Gastrointestinal hemorrhage, unspecified: Secondary | ICD-10-CM | POA: Diagnosis present

## 2020-04-12 DIAGNOSIS — D619 Aplastic anemia, unspecified: Secondary | ICD-10-CM | POA: Diagnosis present

## 2020-04-12 DIAGNOSIS — Z86718 Personal history of other venous thrombosis and embolism: Secondary | ICD-10-CM

## 2020-04-12 DIAGNOSIS — G8194 Hemiplegia, unspecified affecting left nondominant side: Secondary | ICD-10-CM | POA: Diagnosis not present

## 2020-04-12 DIAGNOSIS — I5032 Chronic diastolic (congestive) heart failure: Secondary | ICD-10-CM | POA: Diagnosis present

## 2020-04-12 DIAGNOSIS — Z888 Allergy status to other drugs, medicaments and biological substances status: Secondary | ICD-10-CM

## 2020-04-12 DIAGNOSIS — Z85118 Personal history of other malignant neoplasm of bronchus and lung: Secondary | ICD-10-CM

## 2020-04-12 DIAGNOSIS — Z79899 Other long term (current) drug therapy: Secondary | ICD-10-CM

## 2020-04-12 DIAGNOSIS — Z9981 Dependence on supplemental oxygen: Secondary | ICD-10-CM

## 2020-04-12 DIAGNOSIS — R109 Unspecified abdominal pain: Secondary | ICD-10-CM | POA: Diagnosis present

## 2020-04-12 DIAGNOSIS — I69354 Hemiplegia and hemiparesis following cerebral infarction affecting left non-dominant side: Secondary | ICD-10-CM | POA: Diagnosis not present

## 2020-04-12 DIAGNOSIS — M6281 Muscle weakness (generalized): Secondary | ICD-10-CM | POA: Diagnosis not present

## 2020-04-12 DIAGNOSIS — E1149 Type 2 diabetes mellitus with other diabetic neurological complication: Secondary | ICD-10-CM

## 2020-04-12 DIAGNOSIS — I252 Old myocardial infarction: Secondary | ICD-10-CM

## 2020-04-12 DIAGNOSIS — G4733 Obstructive sleep apnea (adult) (pediatric): Secondary | ICD-10-CM | POA: Diagnosis present

## 2020-04-12 DIAGNOSIS — N289 Disorder of kidney and ureter, unspecified: Secondary | ICD-10-CM

## 2020-04-12 DIAGNOSIS — E119 Type 2 diabetes mellitus without complications: Secondary | ICD-10-CM

## 2020-04-12 DIAGNOSIS — K2980 Duodenitis without bleeding: Secondary | ICD-10-CM | POA: Diagnosis not present

## 2020-04-12 DIAGNOSIS — Z885 Allergy status to narcotic agent status: Secondary | ICD-10-CM

## 2020-04-12 DIAGNOSIS — E785 Hyperlipidemia, unspecified: Secondary | ICD-10-CM | POA: Diagnosis present

## 2020-04-12 DIAGNOSIS — I499 Cardiac arrhythmia, unspecified: Secondary | ICD-10-CM | POA: Diagnosis not present

## 2020-04-12 DIAGNOSIS — C3491 Malignant neoplasm of unspecified part of right bronchus or lung: Secondary | ICD-10-CM | POA: Diagnosis not present

## 2020-04-12 DIAGNOSIS — I10 Essential (primary) hypertension: Secondary | ICD-10-CM | POA: Diagnosis not present

## 2020-04-12 DIAGNOSIS — K219 Gastro-esophageal reflux disease without esophagitis: Secondary | ICD-10-CM | POA: Diagnosis present

## 2020-04-12 DIAGNOSIS — R140 Abdominal distension (gaseous): Secondary | ICD-10-CM | POA: Diagnosis not present

## 2020-04-12 DIAGNOSIS — F419 Anxiety disorder, unspecified: Secondary | ICD-10-CM | POA: Diagnosis present

## 2020-04-12 DIAGNOSIS — R710 Precipitous drop in hematocrit: Secondary | ICD-10-CM | POA: Diagnosis not present

## 2020-04-12 DIAGNOSIS — D649 Anemia, unspecified: Secondary | ICD-10-CM

## 2020-04-12 DIAGNOSIS — K449 Diaphragmatic hernia without obstruction or gangrene: Secondary | ICD-10-CM | POA: Diagnosis not present

## 2020-04-12 DIAGNOSIS — Z96659 Presence of unspecified artificial knee joint: Secondary | ICD-10-CM | POA: Diagnosis present

## 2020-04-12 DIAGNOSIS — R000 Tachycardia, unspecified: Secondary | ICD-10-CM | POA: Diagnosis not present

## 2020-04-12 DIAGNOSIS — K529 Noninfective gastroenteritis and colitis, unspecified: Secondary | ICD-10-CM | POA: Diagnosis not present

## 2020-04-12 DIAGNOSIS — J9811 Atelectasis: Secondary | ICD-10-CM | POA: Diagnosis not present

## 2020-04-12 DIAGNOSIS — I517 Cardiomegaly: Secondary | ICD-10-CM | POA: Diagnosis not present

## 2020-04-12 LAB — CBC WITH DIFFERENTIAL/PLATELET
Abs Immature Granulocytes: 0.06 10*3/uL (ref 0.00–0.07)
Basophils Absolute: 0 10*3/uL (ref 0.0–0.1)
Basophils Relative: 0 %
Eosinophils Absolute: 0.1 10*3/uL (ref 0.0–0.5)
Eosinophils Relative: 2 %
HCT: 35.3 % — ABNORMAL LOW (ref 39.0–52.0)
Hemoglobin: 10.7 g/dL — ABNORMAL LOW (ref 13.0–17.0)
Immature Granulocytes: 1 %
Lymphocytes Relative: 12 %
Lymphs Abs: 0.9 10*3/uL (ref 0.7–4.0)
MCH: 28.5 pg (ref 26.0–34.0)
MCHC: 30.3 g/dL (ref 30.0–36.0)
MCV: 93.9 fL (ref 80.0–100.0)
Monocytes Absolute: 1 10*3/uL (ref 0.1–1.0)
Monocytes Relative: 13 %
Neutro Abs: 5.5 10*3/uL (ref 1.7–7.7)
Neutrophils Relative %: 72 %
Platelets: 280 10*3/uL (ref 150–400)
RBC: 3.76 MIL/uL — ABNORMAL LOW (ref 4.22–5.81)
RDW: 16.5 % — ABNORMAL HIGH (ref 11.5–15.5)
WBC: 7.7 10*3/uL (ref 4.0–10.5)
nRBC: 0 % (ref 0.0–0.2)

## 2020-04-12 LAB — COMPREHENSIVE METABOLIC PANEL
ALT: 13 U/L (ref 0–44)
AST: 26 U/L (ref 15–41)
Albumin: 2.5 g/dL — ABNORMAL LOW (ref 3.5–5.0)
Alkaline Phosphatase: 76 U/L (ref 38–126)
Anion gap: 9 (ref 5–15)
BUN: 9 mg/dL (ref 8–23)
CO2: 26 mmol/L (ref 22–32)
Calcium: 9 mg/dL (ref 8.9–10.3)
Chloride: 103 mmol/L (ref 98–111)
Creatinine, Ser: 0.49 mg/dL — ABNORMAL LOW (ref 0.61–1.24)
GFR, Estimated: >60 mL/min (ref >60)
Glucose, Bld: 110 mg/dL — ABNORMAL HIGH (ref 70–99)
Potassium: 4.7 mmol/L (ref 3.5–5.1)
Sodium: 138 mmol/L (ref 135–145)
Total Bilirubin: 1.2 mg/dL (ref 0.3–1.2)
Total Protein: 5.7 g/dL — ABNORMAL LOW (ref 6.5–8.1)

## 2020-04-12 LAB — LACTIC ACID, PLASMA: Lactic Acid, Venous: 1.6 mmol/L (ref 0.5–1.9)

## 2020-04-12 LAB — BRAIN NATRIURETIC PEPTIDE: B Natriuretic Peptide: 32.8 pg/mL (ref 0.0–100.0)

## 2020-04-12 LAB — TROPONIN I (HIGH SENSITIVITY): Troponin I (High Sensitivity): 6 ng/L (ref <18)

## 2020-04-12 LAB — LIPASE, BLOOD: Lipase: 23 U/L (ref 11–51)

## 2020-04-12 MED ORDER — PIPERACILLIN-TAZOBACTAM 3.375 G IVPB 30 MIN: 3.3750 g | Freq: Once | INTRAVENOUS | Status: DC

## 2020-04-12 MED ORDER — PANTOPRAZOLE SODIUM 20 MG PO TBEC: 20.0000 mg | DELAYED_RELEASE_TABLET | Freq: Every day | ORAL | 0 refills | Status: DC

## 2020-04-12 MED ORDER — MORPHINE SULFATE (PF) 2 MG/ML IV SOLN
2.0000 mg | Freq: Once | INTRAVENOUS | Status: DC
  Filled 2020-04-12: qty 1

## 2020-04-12 MED ORDER — EUCERIN EX CREA: 1.0000 "application " | TOPICAL_CREAM | Freq: Every day | CUTANEOUS | Status: DC

## 2020-04-12 MED ORDER — LEVETIRACETAM 500 MG PO TABS
500.0000 mg | ORAL_TABLET | Freq: Two times a day (BID) | ORAL | Status: DC
  Administered 2020-04-13 – 2020-04-18 (×12): 500 mg via ORAL
  Filled 2020-04-12 (×13): qty 1

## 2020-04-12 MED ORDER — SODIUM CHLORIDE 0.9 % IV BOLUS
500.0000 mL | Freq: Once | INTRAVENOUS | Status: AC
  Administered 2020-04-12: 500 mL via INTRAVENOUS

## 2020-04-12 MED ORDER — MORPHINE SULFATE (PF) 2 MG/ML IV SOLN
2.0000 mg | Freq: Once | INTRAVENOUS | Status: AC
  Administered 2020-04-12: 2 mg via INTRAVENOUS
  Filled 2020-04-12: qty 1

## 2020-04-12 MED ORDER — TIZANIDINE HCL 4 MG PO TABS
2.0000 mg | ORAL_TABLET | Freq: Three times a day (TID) | ORAL | Status: DC
  Administered 2020-04-13 – 2020-04-18 (×18): 2 mg via ORAL
  Filled 2020-04-12 (×18): qty 1

## 2020-04-12 MED ORDER — SENNOSIDES-DOCUSATE SODIUM 8.6-50 MG PO TABS: 2.0000 | ORAL_TABLET | Freq: Every day | ORAL | Status: DC

## 2020-04-12 MED ORDER — VANCOMYCIN HCL 1250 MG/250ML IV SOLN
1250.0000 mg | Freq: Two times a day (BID) | INTRAVENOUS | Status: DC
  Administered 2020-04-13: 1250 mg via INTRAVENOUS
  Filled 2020-04-12 (×2): qty 250

## 2020-04-12 MED ORDER — FUROSEMIDE 20 MG PO TABS: 80.0000 mg | ORAL_TABLET | Freq: Every day | ORAL | Status: DC

## 2020-04-12 MED ORDER — ENOXAPARIN SODIUM 40 MG/0.4ML ~~LOC~~ SOLN
40.0000 mg | SUBCUTANEOUS | Status: DC
  Administered 2020-04-13: 40 mg via SUBCUTANEOUS
  Filled 2020-04-12: qty 0.4

## 2020-04-12 MED ORDER — ZINC OXIDE 20 % EX OINT
1.0000 "application " | TOPICAL_OINTMENT | Freq: Two times a day (BID) | CUTANEOUS | Status: DC
  Administered 2020-04-13 – 2020-04-18 (×10): 1 via TOPICAL
  Filled 2020-04-12: qty 28.35

## 2020-04-12 MED ORDER — MELATONIN 5 MG PO TABS
5.0000 mg | ORAL_TABLET | Freq: Every day | ORAL | Status: DC
  Administered 2020-04-13 – 2020-04-17 (×6): 5 mg via ORAL
  Filled 2020-04-12 (×7): qty 1

## 2020-04-12 MED ORDER — TRAZODONE HCL 50 MG PO TABS
150.0000 mg | ORAL_TABLET | Freq: Every day | ORAL | Status: DC
Start: 2020-04-12 — End: 2020-04-18
  Administered 2020-04-13 – 2020-04-17 (×6): 150 mg via ORAL
  Filled 2020-04-12 (×6): qty 3

## 2020-04-12 MED ORDER — DULOXETINE HCL 20 MG PO CPEP
40.0000 mg | ORAL_CAPSULE | Freq: Every day | ORAL | Status: DC
Start: 2020-04-13 — End: 2020-04-18
  Administered 2020-04-13 – 2020-04-18 (×6): 40 mg via ORAL
  Filled 2020-04-12 (×6): qty 2

## 2020-04-12 MED ORDER — PREGABALIN 75 MG PO CAPS
150.0000 mg | ORAL_CAPSULE | Freq: Two times a day (BID) | ORAL | Status: DC
  Administered 2020-04-13 – 2020-04-18 (×12): 150 mg via ORAL
  Filled 2020-04-12: qty 1
  Filled 2020-04-12 (×11): qty 2

## 2020-04-12 MED ORDER — SODIUM CHLORIDE 0.9 % IV SOLN
2.0000 g | Freq: Three times a day (TID) | INTRAVENOUS | Status: DC
  Administered 2020-04-13: 2 g via INTRAVENOUS
  Filled 2020-04-12: qty 2

## 2020-04-12 MED ORDER — FOLIC ACID 1 MG PO TABS
1.0000 mg | ORAL_TABLET | Freq: Every day | ORAL | Status: DC
  Administered 2020-04-13 – 2020-04-18 (×6): 1 mg via ORAL
  Filled 2020-04-12 (×6): qty 1

## 2020-04-12 MED ORDER — TRIAMCINOLONE ACETONIDE 0.1 % EX CREA
1.0000 "application " | TOPICAL_CREAM | Freq: Two times a day (BID) | CUTANEOUS | Status: DC
  Administered 2020-04-13 – 2020-04-18 (×11): 1 via TOPICAL
  Filled 2020-04-12 (×2): qty 15

## 2020-04-12 MED ORDER — MOMETASONE FURO-FORMOTEROL FUM 200-5 MCG/ACT IN AERO
2.0000 | INHALATION_SPRAY | Freq: Two times a day (BID) | RESPIRATORY_TRACT | Status: DC
  Administered 2020-04-13 – 2020-04-18 (×10): 2 via RESPIRATORY_TRACT
  Filled 2020-04-12: qty 8.8

## 2020-04-12 MED ORDER — FERROUS SULFATE 325 (65 FE) MG PO TABS
325.0000 mg | ORAL_TABLET | Freq: Every day | ORAL | Status: DC
  Administered 2020-04-13: 325 mg via ORAL
  Filled 2020-04-12: qty 1

## 2020-04-12 MED ORDER — ATORVASTATIN CALCIUM 80 MG PO TABS
80.0000 mg | ORAL_TABLET | Freq: Every day | ORAL | Status: DC
  Administered 2020-04-13 – 2020-04-18 (×6): 80 mg via ORAL
  Filled 2020-04-12: qty 2
  Filled 2020-04-12: qty 1
  Filled 2020-04-12: qty 8
  Filled 2020-04-12 (×3): qty 1

## 2020-04-12 MED ORDER — IPRATROPIUM-ALBUTEROL 0.5-2.5 (3) MG/3ML IN SOLN
3.0000 mL | Freq: Four times a day (QID) | RESPIRATORY_TRACT | Status: DC
  Administered 2020-04-13 (×2): 3 mL via RESPIRATORY_TRACT
  Filled 2020-04-12 (×2): qty 3

## 2020-04-12 MED ORDER — ONDANSETRON HCL 4 MG/2ML IJ SOLN: 4.0000 mg | Freq: Four times a day (QID) | INTRAMUSCULAR | Status: DC | PRN

## 2020-04-12 MED ORDER — IOHEXOL 300 MG/ML  SOLN
100.0000 mL | Freq: Once | INTRAMUSCULAR | Status: AC | PRN
  Administered 2020-04-12: 100 mL via INTRAVENOUS

## 2020-04-12 MED ORDER — VANCOMYCIN HCL 1500 MG/300ML IV SOLN
1500.0000 mg | Freq: Once | INTRAVENOUS | Status: AC
  Administered 2020-04-12: 1500 mg via INTRAVENOUS
  Filled 2020-04-12: qty 300

## 2020-04-12 MED ORDER — MEMANTINE HCL 10 MG PO TABS
10.0000 mg | ORAL_TABLET | Freq: Two times a day (BID) | ORAL | Status: DC
  Administered 2020-04-13 – 2020-04-18 (×12): 10 mg via ORAL
  Filled 2020-04-12 (×13): qty 1

## 2020-04-12 MED ORDER — CLOPIDOGREL BISULFATE 75 MG PO TABS
75.0000 mg | ORAL_TABLET | Freq: Every day | ORAL | Status: DC
  Administered 2020-04-13: 75 mg via ORAL
  Filled 2020-04-12: qty 1

## 2020-04-12 MED ORDER — VITAMIN D 25 MCG (1000 UNIT) PO TABS
1000.0000 [IU] | ORAL_TABLET | Freq: Every day | ORAL | Status: DC
  Administered 2020-04-13 – 2020-04-18 (×6): 1000 [IU] via ORAL
  Filled 2020-04-12 (×6): qty 1

## 2020-04-12 MED ORDER — ACETAMINOPHEN 325 MG PO TABS: 650.0000 mg | ORAL_TABLET | Freq: Four times a day (QID) | ORAL | Status: DC | PRN

## 2020-04-12 MED ORDER — POTASSIUM CHLORIDE CRYS ER 20 MEQ PO TBCR
20.0000 meq | EXTENDED_RELEASE_TABLET | Freq: Every day | ORAL | Status: DC
  Administered 2020-04-13: 20 meq via ORAL
  Filled 2020-04-12: qty 1

## 2020-04-12 MED ORDER — KETOROLAC TROMETHAMINE 15 MG/ML IJ SOLN
15.0000 mg | Freq: Four times a day (QID) | INTRAMUSCULAR | Status: DC | PRN
  Administered 2020-04-13 – 2020-04-14 (×2): 15 mg via INTRAVENOUS
  Filled 2020-04-12 (×2): qty 1

## 2020-04-12 MED ORDER — MIRTAZAPINE 7.5 MG PO TABS
7.5000 mg | ORAL_TABLET | Freq: Every day | ORAL | Status: DC
  Administered 2020-04-13 – 2020-04-17 (×6): 7.5 mg via ORAL
  Filled 2020-04-12 (×7): qty 1

## 2020-04-12 MED ORDER — ALUM & MAG HYDROXIDE-SIMETH 200-200-20 MG/5ML PO SUSP: 30.0000 mL | ORAL | Status: DC | PRN

## 2020-04-12 MED ORDER — SODIUM CHLORIDE 0.9 % IV SOLN
2.0000 g | INTRAVENOUS | Status: AC
  Administered 2020-04-13: 2 g via INTRAVENOUS
  Filled 2020-04-12: qty 2

## 2020-04-12 MED ORDER — POLYETHYLENE GLYCOL 3350 17 G PO PACK: 17.0000 g | PACK | Freq: Every day | ORAL | 0 refills | Status: DC

## 2020-04-12 MED ORDER — ALLOPURINOL 300 MG PO TABS: 300.0000 mg | ORAL_TABLET | Freq: Every day | ORAL | Status: DC

## 2020-04-12 MED ORDER — POLYETHYLENE GLYCOL 3350 17 G PO PACK: 17.0000 g | PACK | Freq: Every day | ORAL | Status: DC | PRN

## 2020-04-12 MED ORDER — ONDANSETRON HCL 4 MG PO TABS: 4.0000 mg | ORAL_TABLET | Freq: Four times a day (QID) | ORAL | Status: DC | PRN

## 2020-04-12 MED ORDER — ACETAMINOPHEN 650 MG RE SUPP: 650.0000 mg | Freq: Four times a day (QID) | RECTAL | Status: DC | PRN

## 2020-04-12 MED ORDER — LOSARTAN POTASSIUM 50 MG PO TABS: 25.0000 mg | ORAL_TABLET | Freq: Every day | ORAL | Status: DC

## 2020-04-12 NOTE — ED Triage Notes (Signed)
[place] sent pt here via ems with reports of increased abd distention for the last several weeks. Pt c/o pain to abdomen. Rales noted to bil lungs. Pt wears oxygen at 2L [place] at baseline. Oxygen saturations 96%.

## 2020-04-12 NOTE — H&P (Incomplete)
Date: 04/12/2020               Patient Name:  [name] MRN: [id number]  DOB: 01-Aug-1950 Age / Sex: 70 y.o., male   PCP: Garwin Brothers, MD         Medical Service: Internal Medicine Teaching Service         Attending Physician: Dr. Lucrezia Starch, MD    First Contact: Dr. Konrad Penta Pager: [phone]  Second Contact: Dr. Marianna Payment Pager: [phone]       After Hours (After 5p/  First Contact Pager: [phone]  weekends / holidays): Second Contact Pager: [phone]   Chief Complaint: abdominal distention  History of Present Illness:  [name] is a 70 year old male with a past medical history of CVA with left hemiparesis, seizures disorder, COPD on 2L chronically, recent COVID infection in January 2022,  Stage Ia NSCLC of the RUL sp RT with presents from [facility] for worsened abdominal distention for the past two days. Patient states he has had 2 days of left periumbilical abdominal pain with associated bloating and diarrhea. Reports 1-3 episodes of non bloody diarrhea a day. [name] came to visit him at nursing home today and install a new TV. Noticed that his belly was distended and hard and wanted him to be evaluated in the ED. He denies fever, chills, chest pain, shortness of breath, cough, nausea, vomiting, dysuria. Denies any changes in diet.  Patient also notes worsening pain from sacral ulcers which he states he has had for about 3 months. States he has been getting wound care for this but feels they have worsened. She also has also been concerned about patient's sacral ulcers and level of care he has been receiving lately due to short staffing. States she sent patient to the ED in January due to noticing sacral ulcers. States she had long discussion with social work during admission and agreeable to him returning with arrangements made for air mattress and wound care. Feels that facility is understaffed due to [place] and is concerned he is not getting adequate care.   ED  Course: Afebrile, with BP of 94/65, with HR of 102. saturating 96% on 2L [place] which he is on chronically. CBC without leukocytosis, BNP, lipase, lactic acid, and toponin within normal limits. CT chest abdomen pelvis with  Trace bilateral pleural effusion, chronic right lower lung scarring, inflammatory changes at the root of the mesentery, and large decubitus ulcer with chronic osteomyelitis. IMTS called for admission for IB amitotics and further wound care  Meds:  Current Meds  Medication Sig  . pantoprazole (PROTONIX) 20 MG tablet Take 1 tablet (20 mg total) by mouth daily.  . polyethylene glycol (MIRALAX) 17 g packet Take 17 g by mouth daily.    Allergies: Allergies as of 04/12/2020 - Review Complete 04/12/2020  Allergen Reaction Noted  . Darvocet [propoxyphene n-acetaminophen] Hives 08/28/2010  . Haldol [haloperidol decanoate] Hives 08/28/2010  . Acetaminophen Nausea Only 11/01/2014  . Metformin and related Nausea And Vomiting 10/15/2015  . Norco [hydrocodone-acetaminophen] Nausea And Vomiting 12/07/2014   Past Medical History:  Diagnosis Date  . Acute blood loss anemia   . Acute CVA (cerebrovascular accident) ([place]) 08/27/2014  . Acute encephalopathy 11/28/2017  . Acute ischemic stroke ([place])   . AKI (acute kidney injury) ([place])   . Alcohol abuse    H/O  . Alcohol dependence with withdrawal with complication ([place]) 3/41/9622  . Anxiety   . Arterial ischemic stroke,  MCA (middle cerebral artery), right, acute ([place])   . Arthritis   . Asthma   . Binocular vision disorder with diplopia 01/03/2013  . CAD (coronary artery disease) 05/27/10   Cath: severe single vessell CAD left cx midportion obtuse marginal 2 to 3.  . Cerebral infarction due to embolism of right middle cerebral artery ([place])   . Cerebral infarction due to stenosis of right carotid artery ([place]) 11/01/2014  . Cerebral thrombosis with cerebral infarction 07/18/2016  . Cerebrovascular accident (CVA) due to stenosis of right  carotid artery ([place])   . CHF (congestive heart failure) ([place])   . Closed blow-out fracture of floor of orbit ([place]) 01/19/2013  . COPD (chronic obstructive pulmonary disease) ([place])   . COPD (chronic obstructive pulmonary disease) ([place])   . CVA (cerebral vascular accident) ([place]) 08/26/2014  . Dementia due to another medical condition ([place]) 07/12/2015  . Depression   . Diabetes mellitus, type 2 ([place]) 03/13/2015  . Diastolic CHF, acute on chronic (HCC) 11/01/2014  . DOE (dyspnea on exertion) 11/23/2014  . DVT (deep venous thrombosis) ([place]) 02/22/2014  . Embolic stroke ([place])   . Enophthalmos due to trauma 01/19/2013  . Eyelid lesion 01/24/2013  . Fracture of inferior orbital wall ([place]) 01/03/2013  . Fracture of orbital floor ([place]) 01/03/2013  . GERD (gastroesophageal reflux disease)   . H/O total knee replacement 10/24/2014  . HCAP (healthcare-associated pneumonia) 10/15/2015  . Headache around the eyes 08/27/2014  . History of DVT (deep vein thrombosis)   . History of DVT of lower extremity   . Hyperkalemia 10/24/2014  . Hyperlipemia   . Hyperlipidemia   . Hypertension   . Hypoalbuminemia due to protein-calorie malnutrition ([place])   . Hypotension   . Labile blood pressure   . Left shoulder pain 09/28/2017  . Lower extremity edema 02/21/2014  . Medial orbital wall fracture (HCC) 01/19/2013  . Myocardial infarction ([place]) 2000  . Orbital fracture ([place]) 12/2012  . OSA on CPAP   . Pain of left eye 01/03/2013  . Peripheral vascular disease, unspecified ([place])    08/20/10 doppler: increase in right ABI post-op. Left ABI stable. S/P bi-fem bypass surgery  . Pneumonia 04/04/2012  . Right middle cerebral artery stroke ([place]) 01/18/2018  . Seizures ([place])   . Shortness of breath   . Stroke (cerebrum) ([place]) 02/06/2015  . Thrombocytopenia ([place])   . Tobacco abuse   . Trichiasis without entropion 04/16/2013   Overview:  LLL central   . Type 2 diabetes mellitus with peripheral neuropathy ([place]) 03/13/2015  .  Urinary incontinence 03/14/2015    Family History: Mother with history of MI and heart failure, father with cirrhosis  Social History: Patient lives at [facility]. POA is [employer]. States he has not drank in last 2 years, smokes about 1 pack a day, states he stopped using drugs.  Review of Systems: A complete ROS was negative except as per HPI.   Physical Exam: Blood pressure (!) 112/55, pulse 100, temperature 98.3 F (36.8 C), temperature source Oral, resp. rate 18, SpO2 96 %.  Physical Exam  Constitutional: chronically ill appearing, laying in bed, in no acute distress HENT: Normocephalic and atraumatic,  moist mucous membranes Eyes: EOMI, Pupils small Cardiovascular: Normal rate, regular rhythm, S1 and S2 present, no murmurs, rubs, gallops.  Distal pulses intact Respiratory: No respiratory distress, no accessory muscle use.  Effort is normal.  Lungs are clear to auscultation bilaterally. GI: Distended with mild tenderness to palpation of the left periumbilical  area, normal active bowel sounds, no masses, no guarding, tympanic to percussion throughout Musculoskeletal: abrasion of the right lower extremity, Sacral wound exam deferred until patient moved to a room, please refer to photos taken in the ED Neurological: Is alert and oriented x4, flaccid paralysis of left upper and lower extremity, 3-?5 strength of RLE  Skin: Warm and dry.  No rash, erythema, lesions noted. Psychiatric: Normal mood and affect. Speech is difficultly to understand at times due to mumbling      Imaging:  DG Chest 1 View  Result Date: 04/12/2020 CLINICAL DATA:  Cough EXAM: CHEST  1 VIEW COMPARISON:  02/27/2020 FINDINGS: Cardiomegaly. Airspace opacity in the right upper lobe and left base, similar to prior study. No visible effusions. No overt edema. No acute bony abnormality. IMPRESSION: Right upper lobe and left lower lobe airspace opacities could reflect atelectasis, pneumonia, or scarring. Findings  similar to prior study. Electronically Signed   By: Rolm Baptise M.D.   On: 04/12/2020 16:36   CT CHEST ABDOMEN PELVIS W CONTRAST IMPRESSION: 1. Trace bilateral pleural effusions, left greater than right. 2. Bilateral areas of parenchymal lung consolidation consistent with scarring and atelectasis. No acute airspace disease. 3. Inflammatory changes at the root of the mesentery, extending to the region of the duodenal sweep. Differential includes duodenitis, peptic ulcer disease, or nonspecific mesenteritis. 4. Large decubitus ulcer overlying the sacrococcygeal junction, with evidence of chronic osteomyelitis at the sacrococcygeal junction. 5. Moderate fecal retention. 6. Stable indeterminate 8 mm left adrenal nodule. 7. Aortic Atherosclerosis (ICD10-I70.0). Electronically Signed   By: Randa Ngo M.D.   On: 04/12/2020 19:55   Assessment & Plan by Problem: Active Problems:   * No active hospital problems. *  Abdominal pain Patient presents with left sided abdominal pain, diarrhea, and distension for the past 2 days. States abdominal pain improved on exam.  Had a bowel movement in the ED. On exam abdomen is distended with mild left sided tenderness, no guarding or rebound. CT abdomen with inflammatory changes at the root of the mesentery extending to the duodenal sweep. Moderate fecal retention. May be due to gastroenteritis vs peptic ulcer disease. Give 500 mL IV bolus in ED and morphine with improvement.  - Continue diet - Continue Maalox - Continue to monitor   Sacral osteomyelitis Patient presents with worsening pain from sacral wounds. Noted to have stage III sacral decubitus ulcers during last admission in January. CT showing large decubitus sacral ucler with chronic osteomyelitis. Sacral injury appears to be worsened since prior hospitalization. Patient with poor wound healing due to immobility due to hemiparesis from prior stroke and malnutrition with albumin 2.5.on admission. Has screen  postive for MRSA in past. Will start treatment with IV antibiotics to cover for MRSA and gram negatives and continue wound care in hospital, plan to discuss with surgery in the morning. - vancomycin, cefepime  - Tylenol 650 mg ever 6 hours as needed, Toradol 15 mg every 6 hours as needed for pain - Consult wound care - Consult RD - PT/OT  Hypotension Patient presented to ED with BP of 94/65 in the ED. Patient asymptomatic.  Improved to 112//55 after 500 mL IV bolus. Noted that patient may be taking losartan 25 mg twice daily. May also be due to opoid medication and patient received morphine in ED for pain. - Monitor vitals - Hold losartan if BP remain low    Type 2 Diabetes Mellitus Not on medication, appear diet controlled. A1c 5.6 during last admission on  02/28/2020. - CBG monitoring  Goals of care [name] is POA has been concerned with level of care he has been receiving at [facility]. Was concerned about this during last admission in January. Concerned about worsening sacral wounds and patient's need are being neglected due to short staffing. Considering having patient come live with her rather than at SNF.  - Consult TOC for placement needs  Diet: [place] healthy, carb modified Fluids: none VTE: Lovenox  Code: DNR  Dispo: Admit patient to Observation with expected length of stay less than 2 midnights.  Signed: Iona Beard, MD 04/12/2020, 10:53 PM  Pager: [phone] After 5pm on weekdays and 1pm on weekends: On Call pager: [phone]

## 2020-04-12 NOTE — ED Notes (Addendum)
Patient moved to trauma, cleansed of stool and urine. Condom cath not attached. New Condom cath reapplied. Sacral dressing applied to wound. Patient back to hall bed. Denies further needs.

## 2020-04-12 NOTE — ED Provider Notes (Addendum)
[facility] EMERGENCY DEPARTMENT Provider Note   CSN: [id number] Arrival date & time: 04/12/20  1520     History Chief Complaint  Patient presents with  . Bloated    [name] is a 70 y.o. male.  HPI 70 year old male with an extensive medical history including alcohol abuse, anxiety, asthma, CAD, prior stroke, CHF with an EF 60 to 65% as of 2020, hypertension, prior alcohol abuse, adenocarcinoma of the lung presents to the ER from [place] with complaints of abdominal pain and significant bloating. States he has pain "all over", maybe worse in his lower abdomen but it is hard for him to tell.  Patient states that his abdomen has been slowly swelling over the last couple weeks, but he has really noticed it getting worse over the last 2 days.  He also complains of diffuse abdominal pain.  He denies any fevers, though feels chills at times.  He endorses a dry nonproductive cough which has been ongoing for several weeks since his COVID-19 hospitilization, on 2 L [place] at baseline.  He denies any nausea or vomiting.  Last bowel movement was yesterday and normal.  Denies any dysuria or hematuria.    Past Medical History:  Diagnosis Date  . Acute blood loss anemia   . Acute CVA (cerebrovascular accident) ([place]) 08/27/2014  . Acute encephalopathy 11/28/2017  . Acute ischemic stroke ([place])   . AKI (acute kidney injury) ([place])   . Alcohol abuse    H/O  . Alcohol dependence with withdrawal with complication ([place]) 6/96/7893  . Anxiety   . Arterial ischemic stroke, MCA (middle cerebral artery), right, acute ([place])   . Arthritis   . Asthma   . Binocular vision disorder with diplopia 01/03/2013  . CAD (coronary artery disease) 05/27/10   Cath: severe single vessell CAD left cx midportion obtuse marginal 2 to 3.  . Cerebral infarction due to embolism of right middle cerebral artery ([place])   . Cerebral infarction due to stenosis of right carotid artery ([place]) 11/01/2014  .  Cerebral thrombosis with cerebral infarction 07/18/2016  . Cerebrovascular accident (CVA) due to stenosis of right carotid artery ([place])   . CHF (congestive heart failure) ([place])   . Closed blow-out fracture of floor of orbit ([place]) 01/19/2013  . COPD (chronic obstructive pulmonary disease) ([place])   . COPD (chronic obstructive pulmonary disease) ([place])   . CVA (cerebral vascular accident) ([place]) 08/26/2014  . Dementia due to another medical condition ([place]) 07/12/2015  . Depression   . Diabetes mellitus, type 2 ([place]) 03/13/2015  . Diastolic CHF, acute on chronic (HCC) 11/01/2014  . DOE (dyspnea on exertion) 11/23/2014  . DVT (deep venous thrombosis) ([place]) 02/22/2014  . Embolic stroke ([place])   . Enophthalmos due to trauma 01/19/2013  . Eyelid lesion 01/24/2013  . Fracture of inferior orbital wall ([place]) 01/03/2013  . Fracture of orbital floor ([place]) 01/03/2013  . GERD (gastroesophageal reflux disease)   . H/O total knee replacement 10/24/2014  . HCAP (healthcare-associated pneumonia) 10/15/2015  . Headache around the eyes 08/27/2014  . History of DVT (deep vein thrombosis)   . History of DVT of lower extremity   . Hyperkalemia 10/24/2014  . Hyperlipemia   . Hyperlipidemia   . Hypertension   . Hypoalbuminemia due to protein-calorie malnutrition ([place])   . Hypotension   . Labile blood pressure   . Left shoulder pain 09/28/2017  . Lower extremity edema 02/21/2014  . Medial orbital wall fracture (HCC) 01/19/2013  .  Myocardial infarction ([place]) 2000  . Orbital fracture ([place]) 12/2012  . OSA on CPAP   . Pain of left eye 01/03/2013  . Peripheral vascular disease, unspecified ([place])    08/20/10 doppler: increase in right ABI post-op. Left ABI stable. S/P bi-fem bypass surgery  . Pneumonia 04/04/2012  . Right middle cerebral artery stroke ([place]) 01/18/2018  . Seizures ([place])   . Shortness of breath   . Stroke (cerebrum) ([place]) 02/06/2015  . Thrombocytopenia ([place])   . Tobacco abuse   . Trichiasis without  entropion 04/16/2013   Overview:  LLL central   . Type 2 diabetes mellitus with peripheral neuropathy ([place]) 03/13/2015  . Urinary incontinence 03/14/2015    Patient Active Problem List   Diagnosis Date Noted  . Pressure injury of skin 02/27/2020  . Acute respiratory disease due to COVID-19 virus 02/27/2020  . Acute on chronic respiratory failure with hypoxia ([place]) 02/27/2020  . Pleural effusion, left 02/27/2020  . Renal insufficiency 02/27/2020  . Adenocarcinoma of right lung, stage 1 ([place]) 04/26/2019  . Goals of care, counseling/discussion 10/12/2018  . Gastric and duodenal angiodysplasia   . Polyp of descending colon   . Polyp of rectum   . Iron deficiency anemia   . Abnormal CT scan, colon   . Heme + stool   . Lung mass   . Symptomatic anemia 08/22/2018  . IV site infection ([place]) 03/31/2018  . Left leg weakness   . Stenosis of brachiocephalic artery ([place]) 10/93/2355  . Lung nodule, solitary 03/28/2018  . Solitary pulmonary nodule on lung CT   . Normocytic anemia 03/26/2018  . Abdominal pain, acute, left upper quadrant   . Dysarthria   . Recurrent abdominal pain   . Dementia due to another medical condition, without behavioral disturbance ([place])   . Pruritus   . Left hemiplegia ([place])   . Insomnia 01/29/2016  . Psoriasiform dermatitis 07/12/2015  . Type 2 diabetes mellitus with peripheral neuropathy ([place]) 03/13/2015  . History of Hemiparesis affecting left side as late effect of cerebrovascular accident ([place]) 02/18/2015  . Coronary artery disease involving native coronary artery of native heart without angina pectoris   . History of stroke   . HLD (hyperlipidemia) 11/01/2014  . Tobacco use disorder 11/01/2014  . Carotid stenosis 11/01/2014  . Essential hypertension   . Polysubstance abuse ([place]) 08/28/2014  . Cocaine abuse ([place])   . Intracranial carotid stenosis   . History of DVT (deep vein thrombosis)   . Alcohol use disorder, moderate, dependence ([place])   . Stroke ([place])    . Chronic diastolic congestive heart failure ([place]) 11/21/2013  . GERD (gastroesophageal reflux disease) 08/10/2013  . Gout 08/10/2013  . Peripheral vascular disease ([place]) 07/27/2012  . COPD (chronic obstructive pulmonary disease) ([place]) 04/04/2012    Past Surgical History:  Procedure Laterality Date  . abdoninal ao angio & bifem angio  05/27/10   Patent graft, occluded bil stents with no retrograde flow into the hypogastric arteries. 100% occl left ant. tibial artery, 70% to 80% to 100% stenosis right superficial fem artery above adductor canal. 100 % occl right ant. tibial vessell  . Aortogram w/ PTCA  09/292003  . BACK SURGERY    . BIOPSY  08/26/2018   Procedure: BIOPSY;  Surgeon: Lavena Bullion, DO;  Location: [facility]OS[facility];  Service: Gastroenterology;;  . CARDIAC CATHETERIZATION  05/27/10   severe CAD left cx  . CHOLECYSTECTOMY    . ESOPHAGOGASTRIC FUNDOPLICATION    . ESOPHAGOGASTRODUODENOSCOPY (EGD) WITH PROPOFOL N/A  08/26/2018   Procedure: ESOPHAGOGASTRODUODENOSCOPY (EGD) WITH PROPOFOL;  Surgeon: Lavena Bullion, DO;  Location: [facility];  Service: Gastroenterology;  Laterality: N/A;  . EYE SURGERY    . FEMORAL BYPASS  08/19/10   Right Fem-Pop  . FLEXIBLE SIGMOIDOSCOPY N/A 08/26/2018   Procedure: FLEXIBLE SIGMOIDOSCOPY;  Surgeon: Lavena Bullion, DO;  Location: [facility];  Service: Gastroenterology;  Laterality: N/A;  . HOT HEMOSTASIS N/A 08/26/2018   Procedure: HOT HEMOSTASIS (ARGON PLASMA COAGULATION/BICAP);  Surgeon: Lavena Bullion, DO;  Location: [facility] ENDOSCOPY;  Service: Gastroenterology;  Laterality: N/A;  . IR ANGIO INTRA EXTRACRAN SEL COM CAROTID INNOMINATE BILAT MOD SED  07/20/2016  . IR ANGIO INTRA EXTRACRAN SEL COM CAROTID INNOMINATE BILAT MOD SED  01/14/2018  . IR ANGIO VERTEBRAL SEL SUBCLAVIAN INNOMINATE UNI R MOD SED  07/20/2016  . IR ANGIO VERTEBRAL SEL VERTEBRAL BILAT MOD SED  01/14/2018  . IR ANGIO VERTEBRAL SEL VERTEBRAL UNI L MOD SED  07/20/2016  . IR  RADIOLOGIST EVAL & MGMT  05/27/2016  . IR US GUIDE VASC ACCESS RIGHT  01/14/2018  . JOINT REPLACEMENT    . PERIPHERAL VASCULAR CATHETERIZATION N/A 12/10/2014   Procedure: Abdominal Aortogram;  Surgeon: Angelia Mould, MD;  Location: [name] LAB;  Service: Cardiovascular;  Laterality: N/A;  . POLYPECTOMY  08/26/2018   Procedure: POLYPECTOMY;  Surgeon: Lavena Bullion, DO;  Location: [facility] ENDOSCOPY;  Service: Gastroenterology;;  . RADIOLOGY WITH ANESTHESIA N/A 02/08/2015   Procedure: RADIOLOGY WITH ANESTHESIA;  Surgeon: Luanne Bras, MD;  Location: [facility];  Service: Radiology;  Laterality: N/A;  . TEE WITHOUT CARDIOVERSION N/A 08/29/2014   Procedure: TRANSESOPHAGEAL ECHOCARDIOGRAM (TEE);  Surgeon: Josue Hector, MD;  Location: [facility] ENDOSCOPY;  Service: Cardiovascular;  Laterality: N/A;  . TOTAL KNEE ARTHROPLASTY         Family History  Problem Relation Age of Onset  . Heart attack Mother   . Heart failure Mother   . Cirrhosis Father   . Heart failure Brother   . Cancer Brother   . Hypertension Neg Hx        UNKNOWN  . Stroke Neg Hx        UNKNOWN    Social History   Tobacco Use  . Smoking status: Current Every Day Smoker    Packs/day: 0.50    Years: 50.00    Pack years: 25.00    Types: Cigars, Cigarettes    Start date: 02/09/1961  . Smokeless tobacco: Former Systems developer    Types: Chew  . Tobacco comment: 2 ppd, full flavor  Substance Use Topics  . Alcohol use: Yes    Comment: occasionally, "I might drink 1 every 6 months"  . Drug use: Yes    Types: Cocaine    Comment: "I smoke crack every once in a while"; last smoked yesterday    Home Medications Prior to Admission medications   Medication Sig Start Date End Date Taking? Authorizing Provider  pantoprazole (PROTONIX) 20 MG tablet Take 1 tablet (20 mg total) by mouth daily. 04/12/20  Yes Garald Balding, PA-C  polyethylene glycol (MIRALAX) 17 g packet Take 17 g by mouth daily. 04/12/20  Yes Garald Balding, PA-C   allopurinol (ZYLOPRIM) 300 MG tablet Take 1 tablet (300 mg total) by mouth daily. 04/01/18   Daisy Floro, DO  aluminum-magnesium hydroxide-simethicone (MAALOX) 200-200-20 MG/5ML SUSP Take 30 mLs by mouth every 4 (four) hours as needed (heartburn).    [provider]  atorvastatin (LIPITOR) 80  MG tablet Take 1 tablet (80 mg total) by mouth daily at 6 PM. Patient taking differently: Take 80 mg by mouth daily. 04/01/18   Daisy Floro, DO  Cholecalciferol 25 MCG (1000 UT) tablet Take 1,000 Units by mouth daily.    [provider]  Clobetasol Propionate 0.05 % shampoo Apply 1 application topically 2 (two) times a week. Tuesday and Friday to scalp and beard for eczema 10/29/19   [provider]  clopidogrel (PLAVIX) 75 MG tablet Take 1 tablet (75 mg total) by mouth daily. 04/01/18   Daisy Floro, DO  docusate sodium (COLACE) 100 MG capsule Take 100 mg by mouth daily.    [provider]  DULoxetine (CYMBALTA) 20 MG capsule Take 40 mg by mouth daily. 04/20/19   [provider]  ferrous sulfate 325 (65 FE) MG tablet Take 1 tablet (325 mg total) by mouth daily with breakfast. 08/26/18   Carollee Leitz, MD  folic acid (FOLVITE) 1 MG tablet Take 1 tablet (1 mg total) by mouth daily. 04/01/18   Daisy Floro, DO  furosemide (LASIX) 40 MG tablet Take 80 mg by mouth daily. 04/21/19   [provider]  guaiFENesin-dextromethorphan (ROBITUSSIN DM) 100-10 MG/5ML syrup Take 5 mLs by mouth every 6 (six) hours as needed for cough.    [provider]  ipratropium-albuterol (DUONEB) 0.5-2.5 (3) MG/3ML SOLN Inhale 3 mLs into the lungs every 6 (six) hours. 04/17/19   [provider]  levETIRAcetam (KEPPRA) 500 MG tablet Take 1 tablet (500 mg total) by mouth 2 (two) times daily. 04/01/18   Daisy Floro, DO  losartan (COZAAR) 25 MG tablet Take 1 tablet (25 mg total) by mouth daily. Patient taking differently: Take 25 mg by mouth 2 (two)  times daily. Hold if SBP <100 04/02/18   Milus Banister C, DO  Melatonin 5 MG TABS Take 5 mg by mouth at bedtime.    [provider]  memantine (NAMENDA) 10 MG tablet Take 1 tablet (10 mg total) by mouth 2 (two) times daily. 04/01/18   Daisy Floro, DO  mirtazapine (REMERON) 7.5 MG tablet Take 7.5 mg by mouth at bedtime. 03/31/19   [provider]  potassium chloride SA (KLOR-CON) 20 MEQ tablet Take 20 mEq by mouth daily. 04/08/19   [provider]  pregabalin (LYRICA) 150 MG capsule Take 1 capsule (150 mg total) by mouth 2 (two) times daily. Patient taking differently: Take 150 mg by mouth every 8 (eight) hours. 04/01/18   Daisy Floro, DO  senna-docusate (SENOKOT-S) 8.6-50 MG tablet Take 2 tablets by mouth at bedtime.    [provider]  Skin Protectants, Misc. (EUCERIN) cream Apply 1 application topically daily.    [provider]  tiZANidine (ZANAFLEX) 2 MG tablet Take 2 mg by mouth 3 (three) times daily.    [provider]  traZODone (DESYREL) 100 MG tablet Take 150 mg by mouth at bedtime.    [provider]  triamcinolone (KENALOG) 0.1 % Apply 1 application topically every 12 (twelve) hours. Legs, itching 02/14/20   [provider]  Grant Ruts INHUB 250-50 MCG/DOSE AEPB Inhale 1-2 puffs into the lungs daily. Patient taking differently: Inhale 1 puff into the lungs daily. 04/01/18   Milus Banister C, DO  zinc oxide 20 % ointment Apply 1 application topically 2 (two) times daily. Day and night shift to diaper area    [provider]    Allergies    Darvocet [propoxyphene n-acetaminophen],  Haldol [haloperidol decanoate], Acetaminophen, Metformin and related, and Norco [hydrocodone-acetaminophen]  Review of Systems   Review of Systems  Constitutional: Positive for chills. Negative for fever.  HENT: Negative for ear pain and sore throat.   Eyes: Negative for pain and visual disturbance.  Respiratory:  Positive for cough. Negative for shortness of breath.   Cardiovascular: Negative for chest pain and palpitations.  Gastrointestinal: Positive for abdominal distention and abdominal pain. Negative for vomiting.  Genitourinary: Negative for dysuria and hematuria.  Musculoskeletal: Negative for arthralgias and back pain.  Skin: Negative for color change and rash.  Neurological: Negative for seizures and syncope.  All other systems reviewed and are negative.   Physical Exam Updated Vital Signs BP 123/63 (BP Location: Left Arm)   Pulse (!) 102   Temp 98.3 F (36.8 C) (Oral)   Resp 18   SpO2 96%   Physical Exam Vitals reviewed.  Constitutional:      Appearance: Normal appearance.  HENT:     Head: Normocephalic and atraumatic.  Eyes:     General:        Right eye: No discharge.        Left eye: No discharge.     Extraocular Movements: Extraocular movements intact.     Conjunctiva/sclera: Conjunctivae normal.  Cardiovascular:     Rate and Rhythm: Normal rate and regular rhythm.     Pulses: Normal pulses.  Pulmonary:     Effort: Pulmonary effort is normal.     Breath sounds: Normal breath sounds. No wheezing.     Comments: Rales in upper and lower lobes bilaterally. On baseline 2L[place], sats 96%  Abdominal:     General: There is distension.     Tenderness: There is abdominal tenderness. There is no right CVA tenderness, left CVA tenderness, guarding or rebound.     Comments: Mild generalized tenderness, no focal tenderness, no peritoneal signs   Musculoskeletal:        General: No swelling. Normal range of motion.     Cervical back: Normal range of motion and neck supple.  Skin:    General: Skin is warm and dry.     Capillary Refill: Capillary refill takes less than 2 seconds.     Findings: Erythema present.     Comments: Large 3 to 4 cm sacral wound which appears to be chronic.  No evidence of necrosis.  Question stage III/IV.  With evidence of recent packing.  No evidence of  slough or eschar noted, no excessive surrounding erythema or evidence of acute cellulitis  Neurological:     General: No focal deficit present.     Mental Status: He is alert and oriented to person, place, and time.     Sensory: No sensory deficit.     Motor: Weakness present.     Comments: Left-sided arm weakness, noted, patient states this is chronic from his prior stroke and is at baseline  Psychiatric:        Mood and Affect: Mood normal.        Behavior: Behavior normal.       ED Results / Procedures / Treatments   Labs (all labs ordered are listed, but only abnormal results are displayed) Labs Reviewed  CBC WITH DIFFERENTIAL/PLATELET - Abnormal; Notable for the following components:      Result Value   RBC 3.76 (*)    Hemoglobin 10.7 (*)    HCT 35.3 (*)    RDW 16.5 (*)    All other components within  normal limits  COMPREHENSIVE METABOLIC PANEL - Abnormal; Notable for the following components:   Glucose, Bld 110 (*)    Creatinine, Ser 0.49 (*)    Total Protein 5.7 (*)    Albumin 2.5 (*)    All other components within normal limits  CULTURE, BLOOD (ROUTINE X 2)  CULTURE, BLOOD (ROUTINE X 2)  LIPASE, BLOOD  BRAIN NATRIURETIC PEPTIDE  LACTIC ACID, PLASMA  URINALYSIS, ROUTINE W REFLEX MICROSCOPIC  TROPONIN I (HIGH SENSITIVITY)  TROPONIN I (HIGH SENSITIVITY)    EKG None  Radiology DG Chest 1 View  Result Date: 04/12/2020 CLINICAL DATA:  Cough EXAM: CHEST  1 VIEW COMPARISON:  02/27/2020 FINDINGS: Cardiomegaly. Airspace opacity in the right upper lobe and left base, similar to prior study. No visible effusions. No overt edema. No acute bony abnormality. IMPRESSION: Right upper lobe and left lower lobe airspace opacities could reflect atelectasis, pneumonia, or scarring. Findings similar to prior study. Electronically Signed   By: Rolm Baptise M.D.   On: 04/12/2020 16:36   CT CHEST ABDOMEN PELVIS W CONTRAST  Result Date: 04/12/2020 CLINICAL DATA:  Increasing abdominal  pain and distension, rales on physical exam EXAM: CT CHEST, ABDOMEN, AND PELVIS WITH CONTRAST TECHNIQUE: Multidetector CT imaging of the chest, abdomen and pelvis was performed following the standard protocol during bolus administration of intravenous contrast. CONTRAST:  178mL OMNIPAQUE IOHEXOL 300 MG/ML  SOLN COMPARISON:  04/12/2020, 02/27/2020 FINDINGS: CT CHEST FINDINGS Cardiovascular: There is a trace pericardial effusion. The heart is not enlarged. No evidence of thoracic aortic aneurysm or dissection. Extensive atherosclerosis throughout the aorta and coronary vessels. High-grade stenosis at the origin of the innominate artery. Mediastinum/Nodes: Stable borderline enlarged right paratracheal lymph node measuring 10 mm in short axis. No other pathologic adenopathy. Thyroid, trachea, and esophagus are grossly unremarkable. Small hiatal hernia incidentally seen. Lungs/Pleura: There are trace bilateral pleural effusions, left greater than right. Dependent areas of atelectasis are seen within the lower lobes. Linear consolidation within the right upper lobe abutting the minor fissure most consistent with scarring. No pneumothorax. Central airways are patent. Musculoskeletal: No acute or destructive bony lesions. 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 Reconstructed images demonstrate no additional findings. CT ABDOMEN PELVIS FINDINGS Hepatobiliary: No focal liver abnormality is seen. Status post cholecystectomy. No biliary dilatation. Pancreas: Unremarkable. No pancreatic ductal dilatation or surrounding inflammatory changes. Spleen: Normal in size without focal abnormality. Adrenals/Urinary Tract: Stable indeterminate 8 mm left adrenal nodule. The right adrenals unremarkable. Chronic scarring lower pole left kidney. Otherwise the kidneys enhance normally and symmetrically. No urinary tract calculi or obstructive uropathy. Bladder is unremarkable. Stomach/Bowel: No bowel obstruction  or ileus. Moderate stool throughout the colon. Normal appendix right lower quadrant. There is fat stranding at the root of the mesentery along the duodenal sweep, nonspecific. Findings could reflect duodenitis or peptic ulcer disease in the appropriate clinical setting, though nonspecific enteritis or mesenteric panniculitis can give a similar appearance. No bowel wall thickening. Vascular/Lymphatic: Extensive atherosclerosis of the upper abdominal aorta and its branches. Postsurgical changes from aortobifemoral bypass. No pathologic adenopathy. Numerous subcentimeter lymph nodes at the root of the mesentery are likely reactive. Reproductive: Prostate is unremarkable. Other: There is a large decubitus ulcer overlying the sacrococcygeal junction, extending to the dorsal cortex of the sacrococcygeal junction. Irregularity of the bony margin consistent with chronic osteomyelitis. No free intraperitoneal fluid or free gas. No abdominal wall hernia. Musculoskeletal: No acute displaced fractures. Reconstructed images demonstrate no additional findings. IMPRESSION: 1. Trace bilateral  pleural effusions, left greater than right. 2. Bilateral areas of parenchymal lung consolidation consistent with scarring and atelectasis. No acute airspace disease. 3. Inflammatory changes at the root of the mesentery, extending to the region of the duodenal sweep. Differential includes duodenitis, peptic ulcer disease, or nonspecific mesenteritis. 4. Large decubitus ulcer overlying the sacrococcygeal junction, with evidence of chronic osteomyelitis at the sacrococcygeal junction. 5. Moderate fecal retention. 6. Stable indeterminate 8 mm left adrenal nodule. 7. Aortic Atherosclerosis (ICD10-I70.0). Electronically Signed   By: Randa Ngo M.D.   On: 04/12/2020 19:55    Procedures Procedures   Medications Ordered in ED Medications  vancomycin (VANCOREADY) IVPB 1500 mg/300 mL (has no administration in time range)  vancomycin  (VANCOREADY) IVPB 1250 mg/250 mL (has no administration in time range)  sodium chloride 0.9 % bolus 500 mL (0 mLs Intravenous Stopped 04/12/20 1808)  morphine 2 MG/ML injection 2 mg (2 mg Intravenous Given 04/12/20 1738)  sodium chloride 0.9 % bolus 500 mL (0 mLs Intravenous Stopped 04/12/20 1930)  iohexol (OMNIPAQUE) 300 MG/ML solution 100 mL (100 mLs Intravenous Contrast Given 04/12/20 1923)    ED Course  I have reviewed the triage vital signs and the nursing notes.  Pertinent labs & imaging results that were available during my care of the patient were reviewed by me and considered in my medical decision making (see chart for details).  Clinical Course as of 04/12/20 2239  Fri Apr 13, 2666  2561 70 year old male who presents to the ER with complaints of abdominal swelling/distention with progressive abdominal pain over the last 2 days.  On arrival, he is alert, oriented, chronically ill-appearing, however no acute distress, resting comfortably in the ER bed.  He is able to provide a coherent history.  On exam, he does have a significantly distended abdomen with some generalized tenderness, questionably more focal in the lower abdomen.  No peritoneal signs, no guarding.  He does have rales in all 4 lung fields throughout.  He is on his normal 2 L [place] at 96%.  On arrival, blood pressure of 94/65, however afebrile, mildly tachycardic at 102.  Low suspicion for sepsis given no source of infection, no fevers.  Differential diagnosis includes ascites given history of alcohol abuse, heart failure, metastases given history of lung cancer.  Plan to initiate broad-spectrum work-up including basic labs, BNP, lipase, UA, lactic acid, blood cultures, troponin, chest x-ray.  Plan for CT of the abdomen/pelvis with possible CT of the chest given history of lung cancer pending renal function.  Given soft BPS, will initiate 500s of NS per discussion w/ Dr. Roslynn Amble.  Morphine provided for pain relief.   [MB]  8338 CBC  without leukocytosis, stable hemoglobin.  CMP without any significant electrode abnormalities, slightly decreased creatinine of 0.49 likely due to age.  BNP normal.  Lipase normal.  Initial lactic normal.   [MB]  1838 Chest x-ray with right upper and lower lobe opacities, question atelectasis, pneumonia, scarring [MB]  1838 As per discussion with Dr. Roslynn Amble, plan to order a CT chest, abdomen, pelvis with contrast to further evaluate mass seen on chest x-ray and evaluate abdomen [MB]  1958 CT of the abdomen with trace bilateral pleural effusions, with parenchymal lung consolidation consistent with scarring and atelectasis.  In terms of the abdomen, he has inflammation of the root of the mesentery extending to the duodenal sweep, differential including duodenitis, PUD, nonspecific mesenteric redness.  He also has a large decubitus ulcer overlying sacrococcygeal junction.  He also has  a stable indeterminate 8 mm left adrenal nodule. [MB]  2051 Sacral wound evaluated, no evidence of acute cellulitis, no evidence of necrosis or acute infection.  Consistent with chronic wound, patient reports that has been there for at least 3 months.  Patient reports he does not walk at baseline, states he uses a wheelchair for ambulation [MB]  2124 Repeat blood pressure 123/63.  No evidence of sepsis, CT of the abdomen with moderate stool burden, however no other significant abnormalities noted to explain his abdominal swelling.  No evidence of ascites or masses.  Discussed patient's chronic osteo with Dr. Stann Mainland with orthopedics, he reports that Ortho generally does not manage this.  He states sometimes plastics does, however he does not think that our plastics team here in [place] does this.  He recommends possible follow-up with week plastics.  Patient having his wound managed in an outpatient setting, failing outpatient management.  I feel that he needs to be brought in for IV antibiotics, further wound care, setting up  with possible outpatient follow-up for his osteomyelitis and transient hypotension.  Initiated broad-spectrum antibiotics with Vanco and Zosyn.  I discussed this with the patient who is agreeable to this plan.  Spoke with the hospitalist team who will evaluate the patient for further management and treatment.  This was a shared visit with my supervising physician Dr.Dykstra who independently saw and evaluated the patient & provided guidance in evaluation/management/disposition ,in agreement with care  [MB]    Clinical Course User Index [MB] Lyndel Safe   MDM Rules/Calculators/A&P                           Final Clinical Impression(s) / ED Diagnoses Final diagnoses:  Abdominal pain  Constipation, unspecified constipation type  Wound of sacral region, subsequent encounter    Rx / DC Orders ED Discharge Orders         Ordered    polyethylene glycol (MIRALAX) 17 g packet  Daily        04/12/20 2128    pantoprazole (PROTONIX) 20 MG tablet  Daily        04/12/20 2128    Ambulatory referral to Wound Clinic        04/12/20 2129            Lyndel Safe 04/12/20 2239    Lucrezia Starch, MD 04/15/20 1439

## 2020-04-12 NOTE — ED Notes (Signed)
Patient in CT at this time.

## 2020-04-12 NOTE — ED Notes (Signed)
Admitting team at bedside.

## 2020-04-12 NOTE — ED Notes (Signed)
I notified Dr Roslynn Amble of pt's bp and obtained orders.

## 2020-04-12 NOTE — H&P (Addendum)
Date: 04/12/2020               Patient Name:  [name] MRN: [id number]  DOB: 08/25/1950 Age / Sex: 70 y.o., male   PCP: Garwin Brothers, MD         Medical Service: Internal Medicine Teaching Service         Attending Physician: Dr. Lucrezia Starch, MD    First Contact: Dr. Konrad Penta Pager: [phone]  Second Contact: Dr. Marianna Payment Pager: [phone]       After Hours (After 5p/  First Contact Pager: [phone]  weekends / holidays): Second Contact Pager: [phone]   Chief Complaint: abdominal distention  History of Present Illness:  [name] is a 70 year old male with a past medical history of CVA with left hemiparesis, seizures disorder, COPD on 2L chronically, recent COVID infection in January 2022, Stage Ia NSCLC of the RUL sp RT with presents from [facility] for worsened abdominal distention for the past two days. Patient states he has had 2 days of left periumbilical abdominal pain with associated bloating and diarrhea. Reports 1-3 episodes of non bloody diarrhea a day. [name] came to visit him at nursing home today and install a new TV. Noticed that his belly was distended and hard and wanted him to be evaluated in the ED. He denies fever, chills, chest pain, shortness of breath, cough, nausea, vomiting, dysuria. Denies any changes in diet.  Patient also notes worsening pain from sacral ulcers which he states he has had for about 3 months. States he has been getting wound care for this but feels they have worsened. She also has also been concerned about patient's sacral ulcers and level of care he has been receiving lately due to short staffing. States she sent patient to the ED in January due to noticing sacral ulcers. States she had long discussion with social work during admission and agreeable to him returning with arrangements made for air mattress and wound care. Feels that facility is understaffed due to [place] and is concerned he is not getting adequate care.   ED  Course: Afebrile, with BP of 94/65, with HR of 102. saturating 96% on 2L [place] which he is on chronically. CBC without leukocytosis, BNP, lipase, lactic acid, and toponin within normal limits. CT chest abdomen pelvis with  Trace bilateral pleural effusion, chronic right lower lung scarring, inflammatory changes at the root of the mesentery, and large decubitus ulcer with chronic osteomyelitis. IMTS called for admission for IV amitotics and further wound care  Meds:  Current Meds  Medication Sig  . pantoprazole (PROTONIX) 20 MG tablet Take 1 tablet (20 mg total) by mouth daily.  . polyethylene glycol (MIRALAX) 17 g packet Take 17 g by mouth daily.    Allergies: Allergies as of 04/12/2020 - Review Complete 04/12/2020  Allergen Reaction Noted  . Darvocet [propoxyphene n-acetaminophen] Hives 08/28/2010  . Haldol [haloperidol decanoate] Hives 08/28/2010  . Acetaminophen Nausea Only 11/01/2014  . Metformin and related Nausea And Vomiting 10/15/2015  . Norco [hydrocodone-acetaminophen] Nausea And Vomiting 12/07/2014   Past Medical History:  Diagnosis Date  . Acute blood loss anemia   . Acute CVA (cerebrovascular accident) ([place]) 08/27/2014  . Acute encephalopathy 11/28/2017  . Acute ischemic stroke ([place])   . AKI (acute kidney injury) ([place])   . Alcohol abuse    H/O  . Alcohol dependence with withdrawal with complication ([place]) 3/47/4259  . Anxiety   . Arterial ischemic stroke, MCA (  middle cerebral artery), right, acute ([place])   . Arthritis   . Asthma   . Binocular vision disorder with diplopia 01/03/2013  . CAD (coronary artery disease) 05/27/10   Cath: severe single vessell CAD left cx midportion obtuse marginal 2 to 3.  . Cerebral infarction due to embolism of right middle cerebral artery ([place])   . Cerebral infarction due to stenosis of right carotid artery ([place]) 11/01/2014  . Cerebral thrombosis with cerebral infarction 07/18/2016  . Cerebrovascular accident (CVA) due to stenosis of right  carotid artery ([place])   . CHF (congestive heart failure) ([place])   . Closed blow-out fracture of floor of orbit ([place]) 01/19/2013  . COPD (chronic obstructive pulmonary disease) ([place])   . COPD (chronic obstructive pulmonary disease) ([place])   . CVA (cerebral vascular accident) ([place]) 08/26/2014  . Dementia due to another medical condition ([place]) 07/12/2015  . Depression   . Diabetes mellitus, type 2 ([place]) 03/13/2015  . Diastolic CHF, acute on chronic (HCC) 11/01/2014  . DOE (dyspnea on exertion) 11/23/2014  . DVT (deep venous thrombosis) ([place]) 02/22/2014  . Embolic stroke ([place])   . Enophthalmos due to trauma 01/19/2013  . Eyelid lesion 01/24/2013  . Fracture of inferior orbital wall ([place]) 01/03/2013  . Fracture of orbital floor ([place]) 01/03/2013  . GERD (gastroesophageal reflux disease)   . H/O total knee replacement 10/24/2014  . HCAP (healthcare-associated pneumonia) 10/15/2015  . Headache around the eyes 08/27/2014  . History of DVT (deep vein thrombosis)   . History of DVT of lower extremity   . Hyperkalemia 10/24/2014  . Hyperlipemia   . Hyperlipidemia   . Hypertension   . Hypoalbuminemia due to protein-calorie malnutrition ([place])   . Hypotension   . Labile blood pressure   . Left shoulder pain 09/28/2017  . Lower extremity edema 02/21/2014  . Medial orbital wall fracture (HCC) 01/19/2013  . Myocardial infarction ([place]) 2000  . Orbital fracture ([place]) 12/2012  . OSA on CPAP   . Pain of left eye 01/03/2013  . Peripheral vascular disease, unspecified ([place])    08/20/10 doppler: increase in right ABI post-op. Left ABI stable. S/P bi-fem bypass surgery  . Pneumonia 04/04/2012  . Right middle cerebral artery stroke ([place]) 01/18/2018  . Seizures ([place])   . Shortness of breath   . Stroke (cerebrum) ([place]) 02/06/2015  . Thrombocytopenia ([place])   . Tobacco abuse   . Trichiasis without entropion 04/16/2013   Overview:  LLL central   . Type 2 diabetes mellitus with peripheral neuropathy ([place]) 03/13/2015  .  Urinary incontinence 03/14/2015    Family History: Mother with history of MI and heart failure, father with cirrhosis  Social History: Patient lives at [facility]. POA is [employer]. States he has not drank in last 2 years, smokes about 1 pack a day, states he stopped using drugs.  Review of Systems: A complete ROS was negative except as per HPI.   Physical Exam: Blood pressure (!) 112/55, pulse 100, temperature 98.3 F (36.8 C), temperature source Oral, resp. rate 18, SpO2 96 %.  Physical Exam  Constitutional: chronically ill appearing, laying in bed, in no acute distress HENT: Normocephalic and atraumatic,  moist mucous membranes Eyes: EOMI, Pupils small Cardiovascular: Normal rate, regular rhythm, S1 and S2 present, no murmurs, rubs, gallops.  Distal pulses intact. Trace pitting edema bilaterally Respiratory: No respiratory distress, no accessory muscle use.  Effort is normal.  Lungs are clear to auscultation bilaterally. GI: Distended with mild tenderness to palpation of  the left periumbilical area, normal active bowel sounds, no masses, no guarding, tympanic to percussion throughout Musculoskeletal: abrasion of the right lower extremity, Sacral wound exam deferred until patient moved to a room, please refer to photos taken in the ED Neurological: Is alert and oriented x4, flaccid paralysis of left upper and lower extremity, 3-?5 strength of RLE  Skin: Warm and dry.  No rash, erythema, lesions noted. Psychiatric: Normal mood and affect. Speech is difficultly to understand at times due to mumbling      Imaging:  DG Chest 1 View  Result Date: 04/12/2020 CLINICAL DATA:  Cough EXAM: CHEST  1 VIEW COMPARISON:  02/27/2020 FINDINGS: Cardiomegaly. Airspace opacity in the right upper lobe and left base, similar to prior study. No visible effusions. No overt edema. No acute bony abnormality. IMPRESSION: Right upper lobe and left lower lobe airspace opacities could reflect atelectasis,  pneumonia, or scarring. Findings similar to prior study. Electronically Signed   By: Rolm Baptise M.D.   On: 04/12/2020 16:36   CT CHEST ABDOMEN PELVIS W CONTRAST IMPRESSION: 1. Trace bilateral pleural effusions, left greater than right. 2. Bilateral areas of parenchymal lung consolidation consistent with scarring and atelectasis. No acute airspace disease. 3. Inflammatory changes at the root of the mesentery, extending to the region of the duodenal sweep. Differential includes duodenitis, peptic ulcer disease, or nonspecific mesenteritis. 4. Large decubitus ulcer overlying the sacrococcygeal junction, with evidence of chronic osteomyelitis at the sacrococcygeal junction. 5. Moderate fecal retention. 6. Stable indeterminate 8 mm left adrenal nodule. 7. Aortic Atherosclerosis (ICD10-I70.0). Electronically Signed   By: Randa Ngo M.D.   On: 04/12/2020 19:55   Assessment & Plan by Problem: Active Problems:   * No active hospital problems. *  Abdominal pain Patient presents with left sided abdominal pain, diarrhea, and distension for the past 2 days. States abdominal pain improved on exam.  Had a bowel movement in the ED. On exam abdomen is distended with mild left sided tenderness, no guarding or rebound. CT abdomen with inflammatory changes at the root of the mesentery extending to the duodenal sweep. Moderate fecal retention. May be due to gastroenteritis vs peptic ulcer disease. Give 500 mL IV bolus in ED and morphine with improvement.  - Continue diet - Continue Maalox - Continue to monitor   Sacral osteomyelitis Patient presents with worsening pain from sacral wounds. Noted to have stage III sacral decubitus ulcers during last admission in January. CT showing large decubitus sacral ucler with chronic osteomyelitis. Sacral injury appears to be worsened since prior hospitalization. Patient with poor wound healing due to immobility due to hemiparesis from prior stroke and malnutrition with albumin  2.5.on admission. Has screen postive for MRSA in past. Will start treatment with IV antibiotics to cover for MRSA and gram negatives and continue wound care in hospital, plan to discuss with surgery in the morning. - vancomycin, cefepime  - Tylenol 650 mg ever 6 hours as needed, Toradol 15 mg every 6 hours as needed for pain - Consult wound care - Consult RD - PT/OT  Hypotension Patient presented to ED with BP of 94/65 in the ED. Patient asymptomatic.  Improved to 112//55 after 500 mL IV bolus. Noted that patient may be taking losartan 25 mg twice daily. May also be due to opoid medication and patient received morphine in ED for pain. - Monitor vitals - Hold losartan if BP remain low  COPD On 2 L [place] chronically. Saturating well on 2L. No increased work of breathing.  ON duonebs and wixela at home. - Continue supplemental oxygen - doneb every 6 hours - Dulera 2 puffs twice daily  Heart Failure with preserved EF Last Echo in 03/27/2018 with preserved EF and grade 1 diastolic dysfunction. Does not appear fluid overloaded on exam. BNP 32 - hold lasix and losartan in setting of hypotension - I/os, daily weights  Hypertension Hypotensive on admission, BP improved to 110s. Appears patient may be taking losartan twice daily, will check with patient on how much he has been taking.  - hold lasix and losartan  Type 2 Diabetes Mellitus Not on medication, appear diet controlled. A1c 5.6 during last admission on 02/28/2020. - CBG monitoring  History of CVA Has left sided hemiplegia from prior stroke. Unable to ambulate, uses as wheelchair. History of GI bleeding on dual antiplatelet therapy. Currently on Plavix. - Continue Plavix - Continue statin   NSLC of RUL s/p SBRT  Follow with oncology last seen by Dr. Julien Nordmann on 04/26/2019. CT with stable paratracheal lymph node, and left pleural effusion.  Seizure disorder - Continue keppra 500 mg daily  Depression - Continue duloxetine, trazodone,  and mirtazapine   Goals of care [name] is POA has been concerned with level of care he has been receiving at [facility]. Was concerned about this during last admission in January. Concerned about worsening sacral wounds and patient's need are being neglected due to short staffing. Considering having patient come live with her rather than at SNF.  - Consult TOC for placement needs  Diet: [place] healthy, carb modified Fluids: none VTE: Lovenox  Code: DNR  Dispo: Admit patient to Observation with expected length of stay less than 2 midnights.  Signed: Iona Beard, MD 04/12/2020, 10:53 PM  Pager: [phone] After 5pm on weekdays and 1pm on weekends: On Call pager: [phone]

## 2020-04-12 NOTE — Discharge Instructions (Addendum)
Please take the following medications REGULARLY: - Keflex 1 tablet four times daily until all ABX are gone  - Furosemide 1 tablet daily  - K-Dur 1 tablet daily  - Atorvastatin 1 tablet daily - Plavix 1 tablet daily - Protonix 1 tablet daily - Ferrous sulfate 324mg  1 tablet daily (available over the counter) - Vitamin D 1000 units 1 tablet daily (available over the counter) - Duloxetine 1 tablet nightly - Keppra 1 tablet in the morning, 1 tablet in the evening - Pregabalin 1 tablet in the morning, 1 tablet in the evening  - Memantine 1 tablet in the morning, 1 tablet in the evening  - Wixela [place] 1-2 puffs daily  Please continue the following medications AS NEEDED (PRN): - Tylenol 650mg  mg every 6 hours for mild to moderate pain - Oxycodone 1 tablet every 6 hours for severe / "breakthrough" pain  - Robaxin 1 tablet every 6 hours for muscle spasms  - Miralax 1-2 packets daily, Senokot 1-2 pills up to twice daily, and Colace 1 tablet daily -- Adjust dosing so that patient has regular, soft bowel movements (Note: Colace tends to be less effective)  - Robitussin / Guaifenesin as needed for cough  - Ramelteon OR Melatonin (7.5/3mg , respectively) for sleep - Daily multivitamin  - Clobetazol shampoo, Kenalog cream, Zinc ointment as needed for rashes (per PCP)   Please STOP taking the following medications:  - Losartan 25mg  daily  - Tizanidine 2mg  three times daily - Tramadol 150mg  twice daily - Trazodone 150mg  nightly

## 2020-04-12 NOTE — Progress Notes (Signed)
Pharmacy Antibiotic Note  [name] is a 70 y.o. male admitted on 04/12/2020 with cellulitis.  Pharmacy has been consulted for vancomycin dosing.  WBC wnl, SCr 0.49, LA 1.6.   Plan: -Vancomycin 1500 mg IV load followed by Vancomycin 1250 mg IV Q 12 hrs. Goal AUC 400-550. Expected AUC: 498 SCr used: 0.7 -Zosyn x 1 dose ordered in ED. F/u maintenance dosing  -Monitor CBC, renal fx, cultures and clinical progress -Vanc levels as indicated      Temp (24hrs), Avg:98.6 F (37 C), Min:98.3 F (36.8 C), Max:98.9 F (37.2 C)  Recent Labs  Lab 04/12/20 1609 04/12/20 1634  WBC 7.7  --   CREATININE 0.49*  --   LATICACIDVEN  --  1.6    CrCl cannot be calculated (Unknown ideal weight.).    Allergies  Allergen Reactions  . Darvocet [Propoxyphene N-Acetaminophen] Hives  . Haldol [Haloperidol Decanoate] Hives  . Acetaminophen Nausea Only    Upset stomach, tolerates Hydrocodone/APAP if taken with food  . Metformin And Related Nausea And Vomiting  . Norco [Hydrocodone-Acetaminophen] Nausea And Vomiting    Tolerates if taken with food    Antimicrobials this admission: Vanc 3/4 >>  Zosyn 3/4 >>   Dose adjustments this admission:   Microbiology results: 3/4 BCx:    Thank you for allowing pharmacy to be a part of this patient's care.  Albertina Parr, PharmD., BCPS, BCCCP Clinical Pharmacist Please refer to [facility] for unit-specific pharmacist

## 2020-04-12 NOTE — Progress Notes (Addendum)
Pharmacy Antibiotic Note  [name] is a 70 y.o. male admitted on 04/12/2020 with cellulitis - chronic osteomyelitis.  Pharmacy has been consulted for vancomycin and cefepime dosing.  WBC wnl, SCr 0.49, LA 1.6.   Plan: -Vancomycin 1500 mg IV load followed by Vancomycin 1250 mg IV Q 12 hrs. Goal AUC 400-550. Expected AUC: 498 SCr used: 0.7 -Cefepime 2gm IV q8h -Monitor CBC, renal fx, cultures and clinical progress -Vanc levels as indicated      Temp (24hrs), Avg:98.6 F (37 C), Min:98.3 F (36.8 C), Max:98.9 F (37.2 C)  Recent Labs  Lab 04/12/20 1609 04/12/20 1634  WBC 7.7  --   CREATININE 0.49*  --   LATICACIDVEN  --  1.6    CrCl cannot be calculated (Unknown ideal weight.).    Allergies  Allergen Reactions  . Darvocet [Propoxyphene N-Acetaminophen] Hives  . Haldol [Haloperidol Decanoate] Hives  . Acetaminophen Nausea Only    Upset stomach, tolerates Hydrocodone/APAP if taken with food  . Metformin And Related Nausea And Vomiting  . Norco [Hydrocodone-Acetaminophen] Nausea And Vomiting    Tolerates if taken with food    Antimicrobials this admission: Vanc 3/4 >>  Cefepime 3/4 >>   Microbiology results: 3/4 BCx:    Thank you for allowing pharmacy to be a part of this patient's care.  Sherlon Handing, PharmD, BCPS Please see amion for complete clinical pharmacist phone list 04/12/2020 11:23 PM

## 2020-04-13 ENCOUNTER — Other Ambulatory Visit: Payer: Self-pay

## 2020-04-13 DIAGNOSIS — Z743 Need for continuous supervision: Secondary | ICD-10-CM | POA: Diagnosis not present

## 2020-04-13 DIAGNOSIS — I5032 Chronic diastolic (congestive) heart failure: Secondary | ICD-10-CM | POA: Diagnosis present

## 2020-04-13 DIAGNOSIS — F32A Depression, unspecified: Secondary | ICD-10-CM | POA: Diagnosis present

## 2020-04-13 DIAGNOSIS — L89154 Pressure ulcer of sacral region, stage 4: Secondary | ICD-10-CM | POA: Diagnosis not present

## 2020-04-13 DIAGNOSIS — Z20822 Contact with and (suspected) exposure to covid-19: Secondary | ICD-10-CM | POA: Diagnosis present

## 2020-04-13 DIAGNOSIS — E785 Hyperlipidemia, unspecified: Secondary | ICD-10-CM | POA: Diagnosis present

## 2020-04-13 DIAGNOSIS — I69354 Hemiplegia and hemiparesis following cerebral infarction affecting left non-dominant side: Secondary | ICD-10-CM | POA: Diagnosis not present

## 2020-04-13 DIAGNOSIS — M8660 Other chronic osteomyelitis, unspecified site: Secondary | ICD-10-CM | POA: Diagnosis not present

## 2020-04-13 DIAGNOSIS — Z96659 Presence of unspecified artificial knee joint: Secondary | ICD-10-CM | POA: Diagnosis present

## 2020-04-13 DIAGNOSIS — I951 Orthostatic hypotension: Secondary | ICD-10-CM | POA: Diagnosis not present

## 2020-04-13 DIAGNOSIS — S31000A Unspecified open wound of lower back and pelvis without penetration into retroperitoneum, initial encounter: Secondary | ICD-10-CM | POA: Diagnosis not present

## 2020-04-13 DIAGNOSIS — G4733 Obstructive sleep apnea (adult) (pediatric): Secondary | ICD-10-CM | POA: Diagnosis present

## 2020-04-13 DIAGNOSIS — K921 Melena: Secondary | ICD-10-CM | POA: Diagnosis present

## 2020-04-13 DIAGNOSIS — G8929 Other chronic pain: Secondary | ICD-10-CM | POA: Diagnosis not present

## 2020-04-13 DIAGNOSIS — K922 Gastrointestinal hemorrhage, unspecified: Secondary | ICD-10-CM | POA: Diagnosis present

## 2020-04-13 DIAGNOSIS — Z66 Do not resuscitate: Secondary | ICD-10-CM | POA: Diagnosis present

## 2020-04-13 DIAGNOSIS — K219 Gastro-esophageal reflux disease without esophagitis: Secondary | ICD-10-CM | POA: Diagnosis present

## 2020-04-13 DIAGNOSIS — E1142 Type 2 diabetes mellitus with diabetic polyneuropathy: Secondary | ICD-10-CM | POA: Diagnosis present

## 2020-04-13 DIAGNOSIS — I110 Hypertensive heart disease with heart failure: Secondary | ICD-10-CM | POA: Diagnosis present

## 2020-04-13 DIAGNOSIS — R059 Cough, unspecified: Secondary | ICD-10-CM | POA: Diagnosis present

## 2020-04-13 DIAGNOSIS — R6889 Other general symptoms and signs: Secondary | ICD-10-CM | POA: Diagnosis not present

## 2020-04-13 DIAGNOSIS — R195 Other fecal abnormalities: Secondary | ICD-10-CM

## 2020-04-13 DIAGNOSIS — D619 Aplastic anemia, unspecified: Secondary | ICD-10-CM | POA: Diagnosis present

## 2020-04-13 DIAGNOSIS — M4647 Discitis, unspecified, lumbosacral region: Secondary | ICD-10-CM | POA: Diagnosis not present

## 2020-04-13 DIAGNOSIS — S31000D Unspecified open wound of lower back and pelvis without penetration into retroperitoneum, subsequent encounter: Secondary | ICD-10-CM | POA: Diagnosis not present

## 2020-04-13 DIAGNOSIS — Z7401 Bed confinement status: Secondary | ICD-10-CM | POA: Diagnosis not present

## 2020-04-13 DIAGNOSIS — R710 Precipitous drop in hematocrit: Secondary | ICD-10-CM | POA: Diagnosis not present

## 2020-04-13 DIAGNOSIS — M4628 Osteomyelitis of vertebra, sacral and sacrococcygeal region: Secondary | ICD-10-CM | POA: Diagnosis present

## 2020-04-13 DIAGNOSIS — D508 Other iron deficiency anemias: Secondary | ICD-10-CM

## 2020-04-13 DIAGNOSIS — R339 Retention of urine, unspecified: Secondary | ICD-10-CM | POA: Diagnosis not present

## 2020-04-13 DIAGNOSIS — I70229 Atherosclerosis of native arteries of extremities with rest pain, unspecified extremity: Secondary | ICD-10-CM | POA: Diagnosis not present

## 2020-04-13 DIAGNOSIS — R569 Unspecified convulsions: Secondary | ICD-10-CM | POA: Diagnosis present

## 2020-04-13 DIAGNOSIS — L89150 Pressure ulcer of sacral region, unstageable: Secondary | ICD-10-CM | POA: Diagnosis not present

## 2020-04-13 DIAGNOSIS — M2550 Pain in unspecified joint: Secondary | ICD-10-CM | POA: Diagnosis not present

## 2020-04-13 DIAGNOSIS — N3090 Cystitis, unspecified without hematuria: Secondary | ICD-10-CM | POA: Diagnosis present

## 2020-04-13 DIAGNOSIS — K5900 Constipation, unspecified: Secondary | ICD-10-CM | POA: Diagnosis not present

## 2020-04-13 DIAGNOSIS — F419 Anxiety disorder, unspecified: Secondary | ICD-10-CM | POA: Diagnosis present

## 2020-04-13 DIAGNOSIS — L89303 Pressure ulcer of unspecified buttock, stage 3: Secondary | ICD-10-CM | POA: Diagnosis not present

## 2020-04-13 DIAGNOSIS — F0280 Dementia in other diseases classified elsewhere without behavioral disturbance: Secondary | ICD-10-CM | POA: Diagnosis present

## 2020-04-13 DIAGNOSIS — M8668 Other chronic osteomyelitis, other site: Secondary | ICD-10-CM | POA: Diagnosis not present

## 2020-04-13 DIAGNOSIS — E1169 Type 2 diabetes mellitus with other specified complication: Secondary | ICD-10-CM | POA: Diagnosis present

## 2020-04-13 DIAGNOSIS — J440 Chronic obstructive pulmonary disease with acute lower respiratory infection: Secondary | ICD-10-CM | POA: Diagnosis present

## 2020-04-13 DIAGNOSIS — I2510 Atherosclerotic heart disease of native coronary artery without angina pectoris: Secondary | ICD-10-CM | POA: Diagnosis present

## 2020-04-13 DIAGNOSIS — E1149 Type 2 diabetes mellitus with other diabetic neurological complication: Secondary | ICD-10-CM | POA: Diagnosis not present

## 2020-04-13 LAB — BASIC METABOLIC PANEL
Anion gap: 9 (ref 5–15)
BUN: 10 mg/dL (ref 8–23)
CO2: 24 mmol/L (ref 22–32)
Calcium: 8.8 mg/dL — ABNORMAL LOW (ref 8.9–10.3)
Chloride: 106 mmol/L (ref 98–111)
Creatinine, Ser: 0.46 mg/dL — ABNORMAL LOW (ref 0.61–1.24)
GFR, Estimated: >60 mL/min (ref >60)
Glucose, Bld: 110 mg/dL — ABNORMAL HIGH (ref 70–99)
Potassium: 3.6 mmol/L (ref 3.5–5.1)
Sodium: 139 mmol/L (ref 135–145)

## 2020-04-13 LAB — CBC
HCT: 30.8 % — ABNORMAL LOW (ref 39.0–52.0)
Hemoglobin: 9.2 g/dL — ABNORMAL LOW (ref 13.0–17.0)
MCH: 28.2 pg (ref 26.0–34.0)
MCHC: 29.9 g/dL — ABNORMAL LOW (ref 30.0–36.0)
MCV: 94.5 fL (ref 80.0–100.0)
Platelets: 240 10*3/uL (ref 150–400)
RBC: 3.26 MIL/uL — ABNORMAL LOW (ref 4.22–5.81)
RDW: 16.2 % — ABNORMAL HIGH (ref 11.5–15.5)
WBC: 5.5 10*3/uL (ref 4.0–10.5)
nRBC: 0 % (ref 0.0–0.2)

## 2020-04-13 LAB — LACTATE DEHYDROGENASE: LDH: 124 U/L (ref 98–192)

## 2020-04-13 LAB — URINALYSIS, ROUTINE W REFLEX MICROSCOPIC
Bilirubin Urine: NEGATIVE
Glucose, UA: NEGATIVE mg/dL
Ketones, ur: NEGATIVE mg/dL
Nitrite: POSITIVE — AB
Protein, ur: NEGATIVE mg/dL
Specific Gravity, Urine: 1.031 — ABNORMAL HIGH (ref 1.005–1.030)
WBC, UA: >50 WBC/hpf — ABNORMAL HIGH (ref 0–5)
pH: 5 (ref 5.0–8.0)

## 2020-04-13 LAB — MRSA PCR SCREENING: MRSA by PCR: NEGATIVE

## 2020-04-13 LAB — CBG MONITORING, ED
Glucose-Capillary: 93 mg/dL (ref 70–99)
Glucose-Capillary: 101 mg/dL — ABNORMAL HIGH (ref 70–99)

## 2020-04-13 LAB — LACTIC ACID, PLASMA: Lactic Acid, Venous: 1.9 mmol/L (ref 0.5–1.9)

## 2020-04-13 LAB — GLUCOSE, CAPILLARY
Glucose-Capillary: 133 mg/dL — ABNORMAL HIGH (ref 70–99)
Glucose-Capillary: 112 mg/dL — ABNORMAL HIGH (ref 70–99)

## 2020-04-13 LAB — POC OCCULT BLOOD, ED: Fecal Occult Bld: POSITIVE — AB

## 2020-04-13 LAB — TROPONIN I (HIGH SENSITIVITY): Troponin I (High Sensitivity): 6 ng/L (ref <18)

## 2020-04-13 LAB — CORTISOL-AM, BLOOD: Cortisol - AM: 16.3 ug/dL (ref 6.7–22.6)

## 2020-04-13 LAB — SARS CORONAVIRUS 2 (TAT 6-24 HRS): SARS Coronavirus 2: NEGATIVE

## 2020-04-13 LAB — D-DIMER, QUANTITATIVE: D-Dimer, Quant: 1.6 ug/mL-FEU — ABNORMAL HIGH (ref 0.00–0.50)

## 2020-04-13 MED ORDER — LACTATED RINGERS IV BOLUS
1000.0000 mL | Freq: Once | INTRAVENOUS | Status: AC
  Administered 2020-04-13: 1000 mL via INTRAVENOUS

## 2020-04-13 MED ORDER — LACTATED RINGERS IV SOLN: INTRAVENOUS | Status: DC

## 2020-04-13 MED ORDER — SODIUM CHLORIDE 0.9 % IV SOLN
80.0000 mg | Freq: Once | INTRAVENOUS | Status: AC
  Administered 2020-04-13: 10:00:00 80 mg via INTRAVENOUS
  Filled 2020-04-13 (×2): qty 80

## 2020-04-13 MED ORDER — PANTOPRAZOLE SODIUM 40 MG IV SOLR: 40.0000 mg | Freq: Two times a day (BID) | INTRAVENOUS | Status: DC

## 2020-04-13 MED ORDER — IPRATROPIUM-ALBUTEROL 0.5-2.5 (3) MG/3ML IN SOLN
3.0000 mL | Freq: Four times a day (QID) | RESPIRATORY_TRACT | Status: DC
  Administered 2020-04-13 – 2020-04-14 (×5): 3 mL via RESPIRATORY_TRACT
  Filled 2020-04-13 (×6): qty 3

## 2020-04-13 MED ORDER — HYDROCERIN EX CREA
TOPICAL_CREAM | Freq: Every day | CUTANEOUS | Status: DC
  Filled 2020-04-13: qty 113

## 2020-04-13 MED ORDER — SODIUM CHLORIDE 0.9 % IV SOLN
8.0000 mg/h | INTRAVENOUS | Status: DC
  Administered 2020-04-13 (×3): 8 mg/h via INTRAVENOUS
  Filled 2020-04-13 (×3): qty 80

## 2020-04-13 NOTE — Evaluation (Signed)
Occupational Therapy Evaluation Patient Details Name: [name] MRN: [id number] DOB: 1950-09-07 Today's Date: 04/13/2020    History of Present Illness The pt is a 70 yo male presenting 3/4 with reports of increased abdominal distension in last few weeks. Pt found to have sacral wound with osteomyelitis since January admission and concern for upper GI bleed. PMH includes: CVA (with L-sided weakness), alcohol abuse, prior tobacco and cocaine use, CAD, CHF, COPD, DM II, DVT, OSA on CPAP, PVD, and seizures.   Clinical Impression   Pt admitted with the above diagnosis, and demonstrates the below listed deficits.  He currently requires max - total A for ADLs and mobility.  He is a long term resident of SNF, who reports he is mostly bed bound, but does occasionally transfer into w/c with hoyer lift.  Feel he is at, or close to his functional baseline.  No acute OT needs identified.  Recommend return to SNF.  OT will sign off.     Follow Up Recommendations  SNF    Equipment Recommendations  None recommended by OT    Recommendations for Other Services       Precautions / Restrictions Precautions Precautions: Fall Precaution Comments: L hemiparesis from prior stroke Restrictions Weight Bearing Restrictions: No      Mobility Bed Mobility Overal bed mobility: Needs Assistance Bed Mobility: Rolling Rolling: Max assist;+2 for physical assistance         General bed mobility comments: pt able to assist with use of bed rail, but requires maxA to position and complete movement. Pt able to hold himself in sidelying, but requires assist to reposition and manage bed mobility    Transfers                 General transfer comment: deferred due to safety concerns on stretcher. pt uses lift at baseline    Balance Overall balance assessment: Needs assistance                             High Level Balance Comments: unable to safely progress to EOB on hospital  stretcher           ADL either performed or assessed with clinical judgement   ADL Overall ADL's : Needs assistance/impaired Eating/Feeding: Bed level;Maximal assistance   Grooming: Wash/dry hands;Wash/dry face;Oral care;Brushing hair;Bed level;Maximal assistance   Upper Body Bathing: Total assistance;Bed level   Lower Body Bathing: Total assistance;Bed level   Upper Body Dressing : Total assistance;Bed level   Lower Body Dressing: Total assistance;Sit to/from stand   Toilet Transfer: Total assistance Toilet Transfer Details (indicate cue type and reason): pt unable to perform safely Toileting- Clothing Manipulation and Hygiene: Total assistance;Bed level       Functional mobility during ADLs: Total assistance       Vision   Additional Comments: not tested     Perception     Praxis      Pertinent Vitals/Pain Pain Assessment: No/denies pain     Hand Dominance Right   Extremity/Trunk Assessment Upper Extremity Assessment Upper Extremity Assessment: LUE deficits/detail;RUE deficits/detail RUE Deficits / Details: Rt UE with significant edema wrist to proximal of elbow, discolored skin - RN notified. He demonstrates AROM shoulder WLF; elbow limited to ~95* flexion actively due to edema.  grip 3+/5 RUE Coordination: decreased fine motor;decreased gross motor LUE Deficits / Details: Pt wtih residual weakness from previous CVA.  Wrist and fingers with flexion contractures.  Able to  achieve ~50% passive finger extension.   Lower Extremity Assessment Lower Extremity Assessment: LLE deficits/detail;Generalized weakness LLE Deficits / Details: chronic L hemiparesis. minimal active movement at L toes   Cervical / Trunk Assessment Cervical / Trunk Assessment: Other exceptions Cervical / Trunk Exceptions: large body habitus   Communication Communication Communication: Expressive difficulties (slight garbled speech)   Cognition Arousal/Alertness: Awake/alert Behavior  During Therapy: WFL for tasks assessed/performed Overall Cognitive Status: No family/caregiver present to determine baseline cognitive functioning Area of Impairment: Orientation;Memory;Safety/judgement;Problem solving                 Orientation Level: Disoriented to;Situation   Memory: Decreased short-term memory   Safety/Judgement: Decreased awareness of safety;Decreased awareness of deficits   Problem Solving: Slow processing;Decreased initiation;Requires verbal cues General Comments: pt pleasant and cooperative throughout with decreased insight/memory to state if certain issues (RUE swelling) were new at time of eval. Able to state some details about PLF such as use of lift for transfers, but unable to state when he last received any therapy. follows all commands   General Comments  significant swelling of RUE around IV site, RN alerted, removed IVs, and planning to remove hospital bracelet due to significant swelling. warm compress applied as forearm cold to the touch.    Exercises Exercises: General Lower Extremity General Exercises - Lower Extremity Ankle Circles/Pumps: AROM;Both;5 reps;Supine Quad Sets: AROM;Right;10 reps;Supine Heel Slides: AROM;Right;10 reps;Supine   Shoulder Instructions      Home Living Family/patient expects to be discharged to:: Skilled nursing facility                                 Additional Comments: pt from [facility], would like to return to different SNF      Prior Functioning/Environment Level of Independence: Needs assistance  Gait / Transfers Assistance Needed: mobilizing to chair intermittently with use of lift, requires assist to complete all bed mobility ADL's / Homemaking Assistance Needed: requires assist with all aspects of ADLs per pt report Communication / Swallowing Assistance Needed: garbled at times          OT Problem List: Decreased strength;Decreased range of motion;Decreased activity  tolerance;Impaired balance (sitting and/or standing);Decreased coordination;Decreased cognition;Obesity      OT Treatment/Interventions:      OT Goals(Current goals can be found in the care plan section) Acute Rehab OT Goals Patient Stated Goal: not return to [employer] OT Goal Formulation: All assessment and education complete, DC therapy  OT Frequency:     Barriers to D/C: Decreased caregiver support  pt is long term resident of SNF       Co-evaluation PT/OT/SLP Co-Evaluation/Treatment: Yes Reason for Co-Treatment: Complexity of the patient's impairments (multi-system involvement);Necessary to address cognition/behavior during functional activity;For patient/therapist safety PT goals addressed during session: Mobility/safety with mobility;Strengthening/ROM OT goals addressed during session: Strengthening/ROM      AM-PAC OT "6 Clicks" Daily Activity     Outcome Measure Help from another person eating meals?: A Lot Help from another person taking care of personal grooming?: A Lot Help from another person toileting, which includes using toliet, bedpan, or urinal?: Total Help from another person bathing (including washing, rinsing, drying)?: Total Help from another person to put on and taking off regular upper body clothing?: Total Help from another person to put on and taking off regular lower body clothing?: Total 6 Click Score: 8   End of Session Nurse Communication: Mobility status  Activity Tolerance: Patient tolerated treatment well Patient left: in bed;with call bell/phone within reach  OT Visit Diagnosis: Unsteadiness on feet (R26.81);Hemiplegia and hemiparesis Hemiplegia - Right/Left: Left Hemiplegia - dominant/non-dominant: Non-Dominant Hemiplegia - caused by: Cerebral infarction                Time: 8406-9861 OT Time Calculation (min): 23 min Charges:  OT General Charges $OT Visit: 1 Visit OT Evaluation $OT Eval Moderate Complexity: 1 Mod  Nilsa Nutting.,  OTR/L Acute Rehabilitation Services Pager [phone] Office [phone]   Lucille Passy M 04/13/2020, 2:45 PM

## 2020-04-13 NOTE — ED Notes (Signed)
Patient remains A/A/O, sitting up in bed eating breakfast, reports left side paralysis from previous stroke, VSS, foley catheter in place and draining well.

## 2020-04-13 NOTE — ED Notes (Signed)
MS Breakfast Ordered 

## 2020-04-13 NOTE — Progress Notes (Signed)
Pt received from ED with left sided weakness, bed bound, from [facility]. Pt denies pain this time,  LR is going on @125  cc/hr, Protonix IV is running @ 8 mg/hr, pt has a huge sacral ulcer/stage 4, wet/dry dressing change done, will call MD for wound nurse consultation. Will continue to monitor the patient  Palma Holter, RN

## 2020-04-13 NOTE — ED Notes (Signed)
GI at bedside

## 2020-04-13 NOTE — Consult Note (Addendum)
Referring Provider: Dr. Leonides Cave Primary Care Physician:  Garwin Brothers, MD Primary Gastroenterologist:  Althia Forts   Reason for Consultation: Anemia, positive FOBT  HPI: [name] is a 70 y.o. male with a past medical history of hypertension, coronary artery disease, CHF with LVEF 60 to 65% per echo 2020, CVA with left  hemiparesis on Plavix, seizure disorder, depression, polysubstance and alcohol abuse, asthma, COPD on oxygen 2L [place], OSA, non-small cell lung carcinoma  diagnosed 10/2018 s/p SBRT, DVT, peripheral vascular disease s/p right fem-pop bypass, diabetes mellitus type 2, sacral decubitus  stage III/IV, GERD, s/p Nissen Fundoplication, GI bleed 0/9326 thought to be due to a gastric AVM, colon polyps and COVID-19 pneumonia requiring hospital admission 1/17 - 03/02/2020 treated with Remdesivir, steroids and O2 6 L nasal cannula.  He presented from his residence at [facility] to [facility] ED 04/12/2020 secondary to abdominal pain and bloating.  He was afebrile in the ED.  BP 94/60.  Labs in the ED showed a sodium level 138.  Potassium 4.7.  Glucose 110.  BUN 9.  Creatinine 0.49.  Alk phos 76.  Lipase 23.  Albumin 2.5.  AST 26.  ALT 13.  Total bili 1.2.  BNP 32.8.  WBC 7.7.  Hemoglobin 10.7 (base line Hg 12.6 on 02/29/2020)  Hematocrit 35.3.  MCV 93.9.  Platelet 280.  Lactic acid 1.6.  FOBT positive.  SARS coronavirus 19 negative.  An abdominal/pelvic CT with contrast identified inflammatory changes at the root of the mesentery extending to the region of the duodenal sweep concerning for duodenitis/peptic ulcer disease versus nonspecific mesenteritis.  A large sacral decubitus with evidence of chronic osteomyelitis was noted.  Moderate fecal retention.  A stable indeterminate 8 mm left adrenal nodule was noted.  A GI consult was requested for further evaluation regarding his abdominal pain, duodenitis per CTAP in the setting of anemia and positive FOBT.  He is a  challenging historian.  He presented to the ED yesterday due to having central abdominal, bloating and buttock pain.  He has a stage III -IV sacral decubitus with chronic osteomyelitis. He developed central abdominal pain 1 or 2 days ago.  No N/V.  He denies having any dysphagia or heartburn.  He is on Pantoprazole 20 mg daily.  He reports passing a bowel movement daily and he usually does not look at his stool as the nursing staff changes his depends.  However, he reported seeing loose black stool yesterday.  No obvious bright red rectal bleeding.  He is on ferrous sulfate 325 mg once daily.  He is on Plavix, no NSAIDs.  His last dose of Plavix was most likely on 3/4.  I attempted to contact the nursing staff at the SNF but I was on hold for a prolonged period of time without speaking to the nursing or medical staff. He denies having any chest pain, palpitations or shortness of breath.  He has a chronic cough for the past 4 years.  He remains on oxygen 2 L nasal cannula since his hospital admission with Covid 19 pneumonia 02/2020 as noted above.    He has a history of a GI bleed 08/2018.  He was admitted to the hospital 08/22/2018 secondary to symptomatic anemia.  At that time, his admission Hemoglobin was 5.9.  Iron levels were normal with a low ferritin level.  FOBT was positive.  He received a total of 3 units of PRBCs.  He underwent an EGD by Dr. Bryan Lemma  08/26/2018 which showed evidence of a prior Nissan fundoplication, a single nonbleeding angiectasia in the stomach which was treated with APC, gastritis, biopsies were negative for H. Pylori. No dysplasia or evidence of malignancy.   A flexible sigmoidoscopy identified four 3 to 6 mm tubular adenomatous and hyperplastic polyps which were removed from the rectum and ascending colon, stool was present in the sigmoid colon and descending colon.  A colonoscopy with a full bowel prep was recommended the next day but the patient declined and he wished to pursue a  colonoscopy as an outpatient which was not done.  He possibly had a colonoscopy more than 10 years ago, further details are unclear.  No known family history of esophageal, gastric or colorectal cancer.  He has no living family members.  CTAP 04/12/2020: 1. Trace bilateral pleural effusions, left greater than right. 2. Bilateral areas of parenchymal lung consolidation consistent with scarring and atelectasis. No acute airspace disease. 3. Inflammatory changes at the root of the mesentery, extending to the region of the duodenal sweep. Differential includes duodenitis, peptic ulcer disease, or nonspecific mesenteritis. 4. Large decubitus ulcer overlying the sacrococcygeal junction, with evidence of chronic osteomyelitis at the sacrococcygeal junction. 5. Moderate fecal retention. 6. Stable indeterminate 8 mm left adrenal nodule. 7. Aortic Atherosclerosis   EGD 08/26/2018: - Normal esophagus. - A Nissen fundoplication was found. The wrap appears intact. - A single non-bleeding angioectasia in the stomach. Treated with argon plasma coagulation (APC). - Gastritis. Biopsied. - Normal mucosa was found in the gastric fundus, in the gastric body and in the incisura. Biopsied. - Normal duodenal bulb, first portion of the duodenum and second portion of the duodenum. Biopsied.  Flexible sigmoidoscopy 08/26/2018: - Preparation of the colon was fair. - Four 3 to 6 mm polyps in the rectum and in the descending colon, removed with a cold snare. Resected and retrieved. - Non-bleeding internal hemorrhoids. - Stool in the sigmoid colon and in the descending colon. - The visualized mucosa was otherwise normal appearing in the rectum, in the rectosigmoid colon, in the sigmoid colon and in the descending colon. - Unable to exclude source of bleeding proximal to the mid descending colon. Discussed remaining inpatient with attempt and repeat bowel preparation overnight and colonoscopy tomorrow, but his  strong preference was to discharge to home with plan for ongoing workup as an outpatient. Discussed the upper endoscopy finding of a single medium sized gastric AVM, treated with APC. This certainly could be the etiology of his IDA from occult GI blood loss, and would be reasonable to continue surveillance as below. Biopsy Report:  1. Duodenum, Biopsy - BENIGN SMALL BOWEL MUCOSA. - NO ACTIVE INFLAMMATION OR VILLOUS ATROPHY IDENTIFIED. 2. Stomach, biopsy - CHRONIC INACTIVE GASTRITIS. - THERE IS NO EVIDENCE OF HELICOBACTER PYLORI, DYSPLASIA, OR MALIGNANCY. - SEE COMMENT. 3. Colon, polyp(s), 2 left descending - TUBULAR ADENOMA(S). - HIGH GRADE DYSPLASIA IS NOT IDENTIFIED. 4. Rectum, polyp(s), X 2 - TUBULAR ADENOMA(S). - HYPERPLASTIC POLYP(S). - HIGH GRADE DYSPLASIA IS NOT IDENTIFIED  Past Medical History:  Diagnosis Date  . Acute blood loss anemia   . Acute CVA (cerebrovascular accident) ([place]) 08/27/2014  . Acute encephalopathy 11/28/2017  . Acute ischemic stroke ([place])   . AKI (acute kidney injury) ([place])   . Alcohol abuse    H/O  . Alcohol dependence with withdrawal with complication ([place]) 03/05/5807  . Anxiety   . Arterial ischemic stroke, MCA (middle cerebral artery), right, acute ([place])   . Arthritis   .  Asthma   . Binocular vision disorder with diplopia 01/03/2013  . CAD (coronary artery disease) 05/27/10   Cath: severe single vessell CAD left cx midportion obtuse marginal 2 to 3.  . Cerebral infarction due to embolism of right middle cerebral artery ([place])   . Cerebral infarction due to stenosis of right carotid artery ([place]) 11/01/2014  . Cerebral thrombosis with cerebral infarction 07/18/2016  . Cerebrovascular accident (CVA) due to stenosis of right carotid artery ([place])   . CHF (congestive heart failure) ([place])   . Closed blow-out fracture of floor of orbit ([place]) 01/19/2013  . COPD (chronic obstructive pulmonary disease) ([place])   . COPD (chronic obstructive pulmonary disease)  ([place])   . CVA (cerebral vascular accident) ([place]) 08/26/2014  . Dementia due to another medical condition ([place]) 07/12/2015  . Depression   . Diabetes mellitus, type 2 ([place]) 03/13/2015  . Diastolic CHF, acute on chronic (HCC) 11/01/2014  . DOE (dyspnea on exertion) 11/23/2014  . DVT (deep venous thrombosis) ([place]) 02/22/2014  . Embolic stroke ([place])   . Enophthalmos due to trauma 01/19/2013  . Eyelid lesion 01/24/2013  . Fracture of inferior orbital wall ([place]) 01/03/2013  . Fracture of orbital floor ([place]) 01/03/2013  . GERD (gastroesophageal reflux disease)   . H/O total knee replacement 10/24/2014  . HCAP (healthcare-associated pneumonia) 10/15/2015  . Headache around the eyes 08/27/2014  . History of DVT (deep vein thrombosis)   . History of DVT of lower extremity   . Hyperkalemia 10/24/2014  . Hyperlipemia   . Hyperlipidemia   . Hypertension   . Hypoalbuminemia due to protein-calorie malnutrition ([place])   . Hypotension   . Labile blood pressure   . Left shoulder pain 09/28/2017  . Lower extremity edema 02/21/2014  . Medial orbital wall fracture (HCC) 01/19/2013  . Myocardial infarction ([place]) 2000  . Orbital fracture ([place]) 12/2012  . OSA on CPAP   . Pain of left eye 01/03/2013  . Peripheral vascular disease, unspecified ([place])    08/20/10 doppler: increase in right ABI post-op. Left ABI stable. S/P bi-fem bypass surgery  . Pneumonia 04/04/2012  . Right middle cerebral artery stroke ([place]) 01/18/2018  . Seizures ([place])   . Shortness of breath   . Stroke (cerebrum) ([place]) 02/06/2015  . Thrombocytopenia ([place])   . Tobacco abuse   . Trichiasis without entropion 04/16/2013   Overview:  LLL central   . Type 2 diabetes mellitus with peripheral neuropathy ([place]) 03/13/2015  . Urinary incontinence 03/14/2015    Past Surgical History:  Procedure Laterality Date  . abdoninal ao angio & bifem angio  05/27/10   Patent graft, occluded bil stents with no retrograde flow into the hypogastric arteries. 100% occl  left ant. tibial artery, 70% to 80% to 100% stenosis right superficial fem artery above adductor canal. 100 % occl right ant. tibial vessell  . Aortogram w/ PTCA  09/292003  . BACK SURGERY    . BIOPSY  08/26/2018   Procedure: BIOPSY;  Surgeon: Lavena Bullion, DO;  Location: [facility];  Service: Gastroenterology;;  . CARDIAC CATHETERIZATION  05/27/10   severe CAD left cx  . CHOLECYSTECTOMY    . ESOPHAGOGASTRIC FUNDOPLICATION    . ESOPHAGOGASTRODUODENOSCOPY (EGD) WITH PROPOFOL N/A 08/26/2018   Procedure: ESOPHAGOGASTRODUODENOSCOPY (EGD) WITH PROPOFOL;  Surgeon: Lavena Bullion, DO;  Location: [facility];  Service: Gastroenterology;  Laterality: N/A;  . EYE SURGERY    . FEMORAL BYPASS  08/19/10   Right Fem-Pop  . FLEXIBLE SIGMOIDOSCOPY N/A 08/26/2018  Procedure: FLEXIBLE SIGMOIDOSCOPY;  Surgeon: Lavena Bullion, DO;  Location: [facility];  Service: Gastroenterology;  Laterality: N/A;  . HOT HEMOSTASIS N/A 08/26/2018   Procedure: HOT HEMOSTASIS (ARGON PLASMA COAGULATION/BICAP);  Surgeon: Lavena Bullion, DO;  Location: [facility] ENDOSCOPY;  Service: Gastroenterology;  Laterality: N/A;  . IR ANGIO INTRA EXTRACRAN SEL COM CAROTID INNOMINATE BILAT MOD SED  07/20/2016  . IR ANGIO INTRA EXTRACRAN SEL COM CAROTID INNOMINATE BILAT MOD SED  01/14/2018  . IR ANGIO VERTEBRAL SEL SUBCLAVIAN INNOMINATE UNI R MOD SED  07/20/2016  . IR ANGIO VERTEBRAL SEL VERTEBRAL BILAT MOD SED  01/14/2018  . IR ANGIO VERTEBRAL SEL VERTEBRAL UNI L MOD SED  07/20/2016  . IR RADIOLOGIST EVAL & MGMT  05/27/2016  . IR US GUIDE VASC ACCESS RIGHT  01/14/2018  . JOINT REPLACEMENT    . PERIPHERAL VASCULAR CATHETERIZATION N/A 12/10/2014   Procedure: Abdominal Aortogram;  Surgeon: Angelia Mould, MD;  Location: [facility];  Service: Cardiovascular;  Laterality: N/A;  . POLYPECTOMY  08/26/2018   Procedure: POLYPECTOMY;  Surgeon: Lavena Bullion, DO;  Location: [facility];  Service: Gastroenterology;;  .  RADIOLOGY WITH ANESTHESIA N/A 02/08/2015   Procedure: RADIOLOGY WITH ANESTHESIA;  Surgeon: Luanne Bras, MD;  Location: [facility];  Service: Radiology;  Laterality: N/A;  . TEE WITHOUT CARDIOVERSION N/A 08/29/2014   Procedure: TRANSESOPHAGEAL ECHOCARDIOGRAM (TEE);  Surgeon: Josue Hector, MD;  Location: [facility];  Service: Cardiovascular;  Laterality: N/A;  . TOTAL KNEE ARTHROPLASTY      Prior to Admission medications   Medication Sig Start Date End Date Taking? Authorizing Provider  aluminum-magnesium hydroxide-simethicone (MAALOX) 188-416-60 MG/5ML SUSP Take 30 mLs by mouth every 4 (four) hours as needed (heartburn).   Yes [provider]  atorvastatin (LIPITOR) 80 MG tablet Take 1 tablet (80 mg total) by mouth daily at 6 PM. Patient taking differently: Take 80 mg by mouth at bedtime. 04/01/18  Yes Daisy Floro, DO  Cholecalciferol 25 MCG (1000 UT) tablet Take 1,000 Units by mouth daily.   Yes [provider]  Clobetasol Propionate 0.05 % shampoo Apply 1 application topically 2 (two) times a week. Tuesday and Friday to scalp and beard for eczema 10/29/19  Yes [provider]  clopidogrel (PLAVIX) 75 MG tablet Take 1 tablet (75 mg total) by mouth daily. 04/01/18  Yes Daisy Floro, DO  Dextromethorphan-guaiFENesin 10-100 MG/5ML liquid Take 10 mLs by mouth every 6 (six) hours as needed (cough).   Yes [provider]  docusate sodium (COLACE) 100 MG capsule Take 100 mg by mouth daily.   Yes [provider]  DULoxetine (CYMBALTA) 20 MG capsule Take 40 mg by mouth at bedtime. 04/20/19  Yes [provider]  ferrous sulfate 325 (65 FE) MG tablet Take 1 tablet (325 mg total) by mouth daily with breakfast. 08/26/18  Yes Carollee Leitz, MD  folic acid (FOLVITE) 1 MG tablet Take 1 tablet (1 mg total) by mouth daily. 04/01/18  Yes Milus Banister C, DO  furosemide (LASIX) 40 MG tablet Take 80 mg by mouth daily. 04/21/19  Yes [provider]  guaiFENesin-dextromethorphan (ROBITUSSIN DM) 100-10 MG/5ML syrup Take 15 mLs by mouth 4 (four) times daily.   Yes [provider]  ipratropium-albuterol (DUONEB) 0.5-2.5 (3) MG/3ML SOLN Inhale 3 mLs into the lungs every 6 (six) hours. 04/17/19  Yes [provider]  levETIRAcetam (KEPPRA) 500 MG tablet Take 1 tablet (500 mg total) by mouth 2 (two) times daily.  04/01/18  Yes Milus Banister C, DO  losartan (COZAAR) 25 MG tablet Take 1 tablet (25 mg total) by mouth daily. Patient taking differently: Take 25 mg by mouth 2 (two) times daily. Hold if SBP <100 04/02/18  Yes Milus Banister C, DO  Melatonin 5 MG TABS Take 5 mg by mouth at bedtime.   Yes [provider]  memantine (NAMENDA) 10 MG tablet Take 1 tablet (10 mg total) by mouth 2 (two) times daily. 04/01/18  Yes Milus Banister C, DO  mirtazapine (REMERON) 7.5 MG tablet Take 7.5 mg by mouth at bedtime. 03/31/19  Yes [provider]  OVER THE COUNTER MEDICATION Take 120 mLs by mouth in the morning and at bedtime. Med plus 2.0- after meals for calorie/protein support   Yes [provider]  oxyCODONE (OXY IR/ROXICODONE) 5 MG immediate release tablet Take 5 mg by mouth in the morning and at bedtime.   Yes [provider]  pantoprazole (PROTONIX) 20 MG tablet Take 1 tablet (20 mg total) by mouth daily. 04/12/20  Yes Garald Balding, PA-C  Pollen Extracts (PROSTAT PO) Take 30 mLs by mouth in the morning and at bedtime.   Yes [provider]  polyethylene glycol (MIRALAX / GLYCOLAX) 17 g packet Take 17 g by mouth daily.   Yes [provider]  polyethylene glycol (MIRALAX) 17 g packet Take 17 g by mouth daily. 04/12/20  Yes Sharyn Lull A, PA-C  potassium chloride SA (KLOR-CON) 20 MEQ tablet Take 20 mEq by mouth daily. 04/08/19  Yes [provider]  pregabalin (LYRICA) 150 MG capsule Take 1 capsule (150 mg total) by mouth 2 (two) times daily. 04/01/18  Yes Milus Banister C, DO  senna-docusate (SENOKOT-S) 8.6-50 MG tablet Take 2 tablets by mouth at bedtime.   Yes [provider]  sodium hypochlorite (DAKIN'S 1/2 STRENGTH) external solution Irrigate with 1 application as directed See admin instructions. Apply to sacral pressure wound bid   Yes [provider]  tiZANidine (ZANAFLEX) 2 MG tablet Take 2 mg by mouth 3 (three) times daily.   Yes [provider]  traMADol (ULTRAM) 50 MG tablet Take 50 mg by mouth every 12 (twelve) hours as needed for moderate pain.   Yes [provider]  traZODone (DESYREL) 100 MG tablet Take 150 mg by mouth at bedtime.   Yes [provider]  triamcinolone (KENALOG) 0.1 % Apply 1 application topically every 12 (twelve) hours. Legs, itching 02/14/20  Yes [provider]  Grant Ruts INHUB 250-50 MCG/DOSE AEPB Inhale 1-2 puffs into the lungs daily. Patient taking differently: Inhale 1 puff into the lungs in the morning and at bedtime. 04/01/18  Yes Daisy Floro, DO    Current Facility-Administered Medications  Medication Dose Route Frequency Provider Last Rate Last Admin  . acetaminophen (TYLENOL) tablet 650 mg  650 mg Oral Q6H PRN Rehman, Areeg N, DO       Or  . acetaminophen (TYLENOL) suppository 650 mg  650 mg Rectal Q6H PRN Rehman, Areeg N, DO      . allopurinol (ZYLOPRIM) tablet 300 mg  300 mg Oral Daily Rehman, Areeg N, DO      . alum & mag hydroxide-simeth (MAALOX/MYLANTA) 200-200-20 MG/5ML suspension 30 mL  30 mL Oral Q4H PRN Rehman, Areeg N, DO      . atorvastatin (LIPITOR) tablet 80 mg  80 mg Oral Daily Rehman, Areeg N, DO   80 mg at 04/13/20 0900  . cholecalciferol (VITAMIN D3) tablet  1,000 Units  1,000 Units Oral Daily Rehman, Areeg N, DO   1,000 Units at 04/13/20 0901  . DULoxetine (CYMBALTA) DR capsule 40 mg  40 mg Oral Daily Rehman, Areeg N, DO   40 mg at 04/13/20 1110  . eucerin cream 1 application  1 application Topical Daily Rehman, Areeg N, DO      . folic acid  (FOLVITE) tablet 1 mg  1 mg Oral Daily Rehman, Areeg N, DO   1 mg at 04/13/20 0900  . ipratropium-albuterol (DUONEB) 0.5-2.5 (3) MG/3ML nebulizer solution 3 mL  3 mL Inhalation Q6H Rehman, Areeg N, DO   3 mL at 04/13/20 1245  . ketorolac (TORADOL) 15 MG/ML injection 15 mg  15 mg Intravenous Q6H PRN Rehman, Areeg N, DO   15 mg at 04/13/20 0016  . lactated ringers infusion   Intravenous Continuous Marianna Payment, MD 125 mL/hr at 04/13/20 1245 New Bag at 04/13/20 1245  . levETIRAcetam (KEPPRA) tablet 500 mg  500 mg Oral BID Rehman, Areeg N, DO   500 mg at 04/13/20 0901  . melatonin tablet 5 mg  5 mg Oral QHS Rehman, Areeg N, DO   5 mg at 04/13/20 0014  . memantine (NAMENDA) tablet 10 mg  10 mg Oral BID Rehman, Areeg N, DO   10 mg at 04/13/20 0901  . mirtazapine (REMERON) tablet 7.5 mg  7.5 mg Oral QHS Rehman, Areeg N, DO   7.5 mg at 04/13/20 0008  . mometasone-formoterol (DULERA) 200-5 MCG/ACT inhaler 2 puff  2 puff Inhalation BID Rehman, Areeg N, DO      . ondansetron (ZOFRAN) tablet 4 mg  4 mg Oral Q6H PRN Rehman, Areeg N, DO       Or  . ondansetron (ZOFRAN) injection 4 mg  4 mg Intravenous Q6H PRN Rehman, Areeg N, DO      . pantoprazole (PROTONIX) 80 mg in sodium chloride 0.9 % 100 mL (0.8 mg/mL) infusion  8 mg/hr Intravenous Continuous Marianna Payment, MD 10 mL/hr at 04/13/20 1056 8 mg/hr at 04/13/20 1056  . [START ON 04/16/2020] pantoprazole (PROTONIX) injection 40 mg  40 mg Intravenous Q12H Marianna Payment, MD      . potassium chloride SA (KLOR-CON) CR tablet 20 mEq  20 mEq Oral Daily Rehman, Areeg N, DO   20 mEq at 04/13/20 0901  . pregabalin (LYRICA) capsule 150 mg  150 mg Oral BID Rehman, Areeg N, DO   150 mg at 04/13/20 1110  . tiZANidine (ZANAFLEX) tablet 2 mg  2 mg Oral TID Rehman, Areeg N, DO   2 mg at 04/13/20 0901  . traZODone (DESYREL) tablet 150 mg  150 mg Oral QHS Rehman, Areeg N, DO   150 mg at 04/13/20 0006  . triamcinolone (KENALOG) 0.1 % cream 1 application  1 application Topical M08Q  Rehman, Areeg N, DO   1 application at 76/19/50 1058  . zinc oxide 20 % ointment 1 application  1 application Topical BID Rehman, Areeg N, DO       Current Outpatient Medications  Medication Sig Dispense Refill  . aluminum-magnesium hydroxide-simethicone (MAALOX) [phone] MG/5ML SUSP Take 30 mLs by mouth every 4 (four) hours as needed (heartburn).    [name] atorvastatin (LIPITOR) 80 MG tablet Take 1 tablet (80 mg total) by mouth daily at 6 PM. (Patient taking differently: Take 80 mg by mouth at bedtime.) 30 tablet 3  . Cholecalciferol 25 MCG (1000 UT) tablet Take 1,000 Units by mouth daily.    [name]  Clobetasol Propionate 0.05 % shampoo Apply 1 application topically 2 (two) times a week. Tuesday and Friday to scalp and beard for eczema    . clopidogrel (PLAVIX) 75 MG tablet Take 1 tablet (75 mg total) by mouth daily. 30 tablet 3  . Dextromethorphan-guaiFENesin 10-100 MG/5ML liquid Take 10 mLs by mouth every 6 (six) hours as needed (cough).    . docusate sodium (COLACE) 100 MG capsule Take 100 mg by mouth daily.    . DULoxetine (CYMBALTA) 20 MG capsule Take 40 mg by mouth at bedtime.    . ferrous sulfate 325 (65 FE) MG tablet Take 1 tablet (325 mg total) by mouth daily with breakfast.  3  . folic acid (FOLVITE) 1 MG tablet Take 1 tablet (1 mg total) by mouth daily.    . furosemide (LASIX) 40 MG tablet Take 80 mg by mouth daily.    [name] guaiFENesin-dextromethorphan (ROBITUSSIN DM) 100-10 MG/5ML syrup Take 15 mLs by mouth 4 (four) times daily.    [name] ipratropium-albuterol (DUONEB) 0.5-2.5 (3) MG/3ML SOLN Inhale 3 mLs into the lungs every 6 (six) hours.    . levETIRAcetam (KEPPRA) 500 MG tablet Take 1 tablet (500 mg total) by mouth 2 (two) times daily. 60 tablet 3  . losartan (COZAAR) 25 MG tablet Take 1 tablet (25 mg total) by mouth daily. (Patient taking differently: Take 25 mg by mouth 2 (two) times daily. Hold if SBP <100) 30 tablet 2  . Melatonin 5 MG TABS Take 5 mg by mouth at bedtime.    . memantine  (NAMENDA) 10 MG tablet Take 1 tablet (10 mg total) by mouth 2 (two) times daily. 6 tablet 0  . mirtazapine (REMERON) 7.5 MG tablet Take 7.5 mg by mouth at bedtime.    [name] OVER THE COUNTER MEDICATION Take 120 mLs by mouth in the morning and at bedtime. Med plus 2.0- after meals for calorie/protein support    . oxyCODONE (OXY IR/ROXICODONE) 5 MG immediate release tablet Take 5 mg by mouth in the morning and at bedtime.    . pantoprazole (PROTONIX) 20 MG tablet Take 1 tablet (20 mg total) by mouth daily. 30 tablet 0  . Pollen Extracts (PROSTAT PO) Take 30 mLs by mouth in the morning and at bedtime.    . polyethylene glycol (MIRALAX / GLYCOLAX) 17 g packet Take 17 g by mouth daily.    . polyethylene glycol (MIRALAX) 17 g packet Take 17 g by mouth daily. 14 each 0  . potassium chloride SA (KLOR-CON) 20 MEQ tablet Take 20 mEq by mouth daily.    . pregabalin (LYRICA) 150 MG capsule Take 1 capsule (150 mg total) by mouth 2 (two) times daily. 180 capsule 0  . senna-docusate (SENOKOT-S) 8.6-50 MG tablet Take 2 tablets by mouth at bedtime.    . sodium hypochlorite (DAKIN'S 1/2 STRENGTH) external solution Irrigate with 1 application as directed See admin instructions. Apply to sacral pressure wound bid    . tiZANidine (ZANAFLEX) 2 MG tablet Take 2 mg by mouth 3 (three) times daily.    . traMADol (ULTRAM) 50 MG tablet Take 50 mg by mouth every 12 (twelve) hours as needed for moderate pain.    . traZODone (DESYREL) 100 MG tablet Take 150 mg by mouth at bedtime.    . triamcinolone (KENALOG) 0.1 % Apply 1 application topically every 12 (twelve) hours. Legs, itching    . WIXELA INHUB 250-50 MCG/DOSE AEPB Inhale 1-2 puffs into the lungs daily. (Patient taking differently: Inhale 1  puff into the lungs in the morning and at bedtime.) 1 each 0    Allergies as of 04/12/2020 - Review Complete 04/12/2020  Allergen Reaction Noted  . Darvocet [propoxyphene n-acetaminophen] Hives 08/28/2010  . Haldol [haloperidol  decanoate] Hives 08/28/2010  . Acetaminophen Nausea Only 11/01/2014  . Metformin and related Nausea And Vomiting 10/15/2015  . Norco [hydrocodone-acetaminophen] Nausea And Vomiting 12/07/2014    Family History  Problem Relation Age of Onset  . Heart attack Mother   . Heart failure Mother   . Cirrhosis Father   . Heart failure Brother   . Cancer Brother   . Hypertension Neg Hx        UNKNOWN  . Stroke Neg Hx        UNKNOWN    Social History   Socioeconomic History  . Marital status: Divorced    Spouse name: Not on file  . Number of children: Not on file  . Years of education: Not on file  . Highest education level: Not on file  Occupational History  . Not on file  Tobacco Use  . Smoking status: Current Every Day Smoker    Packs/day: 0.50    Years: 50.00    Pack years: 25.00    Types: Cigars, Cigarettes    Start date: 02/09/1961  . Smokeless tobacco: Former Systems developer    Types: Chew  . Tobacco comment: 2 ppd, full flavor  Substance and Sexual Activity  . Alcohol use: Yes    Comment: occasionally, "I might drink 1 every 6 months"  . Drug use: Yes    Types: Cocaine    Comment: "I smoke crack every once in a while"; last smoked yesterday  . Sexual activity: Yes  Other Topics Concern  . Not on file  Social History Narrative   Recovering alcoholic   Social Determinants of Health   Financial Resource Strain: Not on file  Food Insecurity: Not on file  Transportation Needs: Not on file  Physical Activity: Not on file  Stress: Not on file  Social Connections: Not on file  Intimate Partner Violence: Not on file    Review of Systems: Gen: Denies fever, sweats or chills. No weight loss.  CV: Denies chest pain, palpitations or edema. Resp: Chronic congested cough for the past 4 years, on O2 2 L nasal cannula at SNF.  No hemoptysis.[name]  GI: See HPI.  Appetite is good, he stated he eats like a horse. GU : Denies urinary burning, blood in urine, increased urinary frequency or  incontinence. MS: Denies joint pain, muscles aches or weakness. Derm: + Sacral decubitus stage. Psych: + Pression. Heme: Denies easy bruising, bleeding. Neuro:  Denies headaches or dizziness. Left hemiparesis. Endo: + DM.  Physical Exam: Vital signs in last 24 hours: Temp:  [98.2 F (36.8 C)-98.9 F (37.2 C)] 98.2 F (36.8 C) (03/05 0830) Pulse Rate:  [50-112] 111 (03/05 1300) Resp:  [13-32] 32 (03/05 1300) BP: (80-129)/(44-75) 129/53 (03/05 1300) SpO2:  [93 %-100 %] 95 % (03/05 1300)   General:  Alert disheveled appearing 70 year old male in no acute distress. Head:  Normocephalic and atraumatic. Eyes:  No scleral icterus. Conjunctiva pink. Ears:  Normal auditory acuity. Nose:  No deformity, discharge or lesions. Mouth:  Dentition intact. No ulcers or lesions.  Neck:  Supple. No lymphadenopathy or thyromegaly.  Lungs: Coarse breath sounds throughout. Heart: Regular rate and rhythm, no murmurs. Abdomen: Obese distended abdomen.  Nontender.  Positive bowel sounds to all four quadrants.  Large midline  abdominal scar intact.  Hypoactive bowel sounds to all four quadrants. Rectal: Sacral decubitus dressing intact.  Rectal vault filled with thick pasty dark brown stool.  No melena or bright red blood. Musculoskeletal: Left  Upper/lower  extremity hemiparesis. Pulses:  Normal pulses noted. Extremities: Lower extremities with 1+ pitting edema. Neurologic:  Alert and  oriented x4. No focal deficits.  Skin:  + Sacral decubitus. Psych:  Alert and cooperative. Normal mood and affect.  Intake/Output from previous day: 03/04 0701 - 03/05 0700 In: 2500 [IV Piggyback:2500] Out: -  Intake/Output this shift: Total I/O In: 2582.9 [IV Piggyback:2582.9] Out: 8185 [Urine:1375]  Lab Results: Recent Labs    04/12/20 1609 04/13/20 0327  WBC 7.7 5.5  HGB 10.7* 9.2*  HCT 35.3* 30.8*  PLT 280 240   BMET Recent Labs    04/12/20 1609 04/13/20 0327  NA 138 139  K 4.7 3.6  CL 103 106   CO2 26 24  GLUCOSE 110* 110*  BUN 9 10  CREATININE 0.49* 0.46*  CALCIUM 9.0 8.8*   LFT Recent Labs    04/12/20 1609  PROT 5.7*  ALBUMIN 2.5*  AST 26  ALT 13  ALKPHOS 76  BILITOT 1.2   PT/INR No results for input(s): LABPROT, INR in the last 72 hours. Hepatitis Panel No results for input(s): HEPBSAG, HCVAB, HEPAIGM, HEPBIGM in the last 72 hours.    Studies/Results: DG Chest 1 View  Result Date: 04/12/2020 CLINICAL DATA:  Cough EXAM: CHEST  1 VIEW COMPARISON:  02/27/2020 FINDINGS: Cardiomegaly. Airspace opacity in the right upper lobe and left base, similar to prior study. No visible effusions. No overt edema. No acute bony abnormality. IMPRESSION: Right upper lobe and left lower lobe airspace opacities could reflect atelectasis, pneumonia, or scarring. Findings similar to prior study. Electronically Signed   By: Rolm Baptise M.D.   On: 04/12/2020 16:36   CT CHEST ABDOMEN PELVIS W CONTRAST  Result Date: 04/12/2020 CLINICAL DATA:  Increasing abdominal pain and distension, rales on physical exam EXAM: CT CHEST, ABDOMEN, AND PELVIS WITH CONTRAST TECHNIQUE: Multidetector CT imaging of the chest, abdomen and pelvis was performed following the standard protocol during bolus administration of intravenous contrast. CONTRAST:  188m OMNIPAQUE IOHEXOL 300 MG/ML  SOLN COMPARISON:  04/12/2020, 02/27/2020 FINDINGS: CT CHEST FINDINGS Cardiovascular: There is a trace pericardial effusion. The heart is not enlarged. No evidence of thoracic aortic aneurysm or dissection. Extensive atherosclerosis throughout the aorta and coronary vessels. High-grade stenosis at the origin of the innominate artery. Mediastinum/Nodes: Stable borderline enlarged right paratracheal lymph node measuring 10 mm in short axis. No other pathologic adenopathy. Thyroid, trachea, and esophagus are grossly unremarkable. Small hiatal hernia incidentally seen. Lungs/Pleura: There are trace bilateral pleural effusions, left greater than  right. Dependent areas of atelectasis are seen within the lower lobes. Linear consolidation within the right upper lobe abutting the minor fissure most consistent with scarring. No pneumothorax. Central airways are patent. Musculoskeletal: No acute or destructive bony lesions. 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 Reconstructed images demonstrate no additional findings. CT ABDOMEN PELVIS FINDINGS Hepatobiliary: No focal liver abnormality is seen. Status post cholecystectomy. No biliary dilatation. Pancreas: Unremarkable. No pancreatic ductal dilatation or surrounding inflammatory changes. Spleen: Normal in size without focal abnormality. Adrenals/Urinary Tract: Stable indeterminate 8 mm left adrenal nodule. The right adrenals unremarkable. Chronic scarring lower pole left kidney. Otherwise the kidneys enhance normally and symmetrically. No urinary tract calculi or obstructive uropathy. Bladder is unremarkable. Stomach/Bowel: No bowel obstruction or  ileus. Moderate stool throughout the colon. Normal appendix right lower quadrant. There is fat stranding at the root of the mesentery along the duodenal sweep, nonspecific. Findings could reflect duodenitis or peptic ulcer disease in the appropriate clinical setting, though nonspecific enteritis or mesenteric panniculitis can give a similar appearance. No bowel wall thickening. Vascular/Lymphatic: Extensive atherosclerosis of the upper abdominal aorta and its branches. Postsurgical changes from aortobifemoral bypass. No pathologic adenopathy. Numerous subcentimeter lymph nodes at the root of the mesentery are likely reactive. Reproductive: Prostate is unremarkable. Other: There is a large decubitus ulcer overlying the sacrococcygeal junction, extending to the dorsal cortex of the sacrococcygeal junction. Irregularity of the bony margin consistent with chronic osteomyelitis. No free intraperitoneal fluid or free gas. No abdominal wall  hernia. Musculoskeletal: No acute displaced fractures. Reconstructed images demonstrate no additional findings. IMPRESSION: 1. Trace bilateral pleural effusions, left greater than right. 2. Bilateral areas of parenchymal lung consolidation consistent with scarring and atelectasis. No acute airspace disease. 3. Inflammatory changes at the root of the mesentery, extending to the region of the duodenal sweep. Differential includes duodenitis, peptic ulcer disease, or nonspecific mesenteritis. 4. Large decubitus ulcer overlying the sacrococcygeal junction, with evidence of chronic osteomyelitis at the sacrococcygeal junction. 5. Moderate fecal retention. 6. Stable indeterminate 8 mm left adrenal nodule. 7. Aortic Atherosclerosis (ICD10-I70.0). Electronically Signed   By: Randa Ngo M.D.   On: 04/12/2020 19:55    IMPRESSION/PLAN: 99.  70 year old male with central abdominal pain, bloat with anemia and positive FOBT.  Hemoglobin 10.7 -> 9.2 (baseline hemoglobin 12.6).  CTAP with contrast 3/4 showed evidence of duodenitis/peptic ulcer disease versus nonspecific mesenteritis without perforation.  He passed two large dark brown bowel movements while in the ED, no melena or bright red rectal bleeding. -Pantoprazole 40 mg one p.o. daily -Clear liquid diet -IV fluids, supportive care, pain management per the medical team -Most likely plan for EGD and colonoscopy early next week -Wound care to assess sacral decubitus, would appreciate recommendations regarding wound cover/drsg during bowel prep to prevent stool contamination into sacral wound -Monitory H/H closely -Continue to hold Plavix -Further recommendations per Dr. Carlean Purl  2. History of GERD s/p Nissen Fundoplication. GI Bleeding 08/2018 thought to be due to a single gastric AVM.  -See Plan in # 1   3. History of tubular adenomatous and colon polyps per flexible sigmoidoscopy 08/2018 -See Plan in # 1  4. History of coronary artery disease, CHF  5.  History of asthma, COPD on oxygen 2 L nasal cannula. Covid pneumonia 02/2020  6. NSCLCA  7. DM II  8.  Sacral decubitus with chronic osteomyelitis       Noralyn Pick  04/13/2020, 1:36 PM  Chronically ill elderly white man with multiple medical problems as outlined above with incomplete work-up for iron deficiency anemia in 2020 as colonoscopy was not prepped well.  Now back with heme positive stool and anemia.  He has a significant sacral decubitus as well.  He will need colonoscopy and repeat EGD this admission I think, would be best to try to treat/bandage his decubitus before we started a colonoscopy prep.  We will probably need to prep over a couple of days.  We often hold Plavix about days before an elective procedure but I think it may be reasonable to push forward with his procedures before that to reduce length of stay and avoid the possibility of complications during hospitalization.  We will sort that out.  It is not contraindicated to perform  a colonoscopy on Plavix it could just limit removal of large polyps although clips could be applied after polypectomy.  He will be picked up by the unassigned service of Dr. Collene Mares in [place] and we will discuss with them.  Once he gets to the floor and his decubitus is addressed we can start gently prepping him to try to have a good prep for this exam.   Gatha Mayer, MD, [facility] 04/13/2020 3:24 PM  [phone] pager

## 2020-04-13 NOTE — ED Notes (Signed)
RN paged Dr. Philipp Ovens per pt's BP of 86/50 after 1L bolus completed

## 2020-04-13 NOTE — ED Notes (Addendum)
Right arm swelling improved, soft/warm/with good color/sensation, +2 palp radial pulse and brachial pulse.  Patient denies pain/discomfort. Using arm without difficulty. MD updated.

## 2020-04-13 NOTE — Evaluation (Addendum)
Physical Therapy Evaluation Patient Details Name: [name] MRN: [id number] DOB: 04-29-1950 Today's Date: 04/13/2020   History of Present Illness  The pt is a 70 yo male presenting 3/4 with reports of increased abdominal distension in last few weeks. Pt found to have sacral wound with osteomyelitis since January admission and concern for upper GI bleed. PMH includes: CVA (with L-sided weakness), alcohol abuse, prior tobacco and cocaine use, CAD, CHF, COPD, DM II, DVT, OSA on CPAP, PVD, and seizures.    Clinical Impression  Pt in bed upon arrival of PT, agreeable to evaluation at this time. Prior to admission the pt was living at [facility] where he received assist for all ADLs and completed transfers with lift. The pt now presents with limitations in functional mobility, strength, activity tolerance, and stability due to above dx and chronic debility, and will continue to benefit from skilled PT to address these deficits. The pt was able to complete rolling and bed mobility, but requires maxA to complete at this time with use of bed rail and assist to achieve positioning due to weakness and difficulty with moving R-sided extremities (in addition to chronic L-sided hemiparesis). The pt was pleasant and cooperative through the session, and will benefit from skilled PT to progress functional activity tolerance to allow for greater pt independence and decreased caregiver burden.      Follow Up Recommendations SNF;Supervision/Assistance - 24 hour    Equipment Recommendations   (defer to post acute)    Recommendations for Other Services       Precautions / Restrictions Precautions Precautions: Fall Precaution Comments: L hemiparesis from prior stroke Restrictions Weight Bearing Restrictions: No      Mobility  Bed Mobility Overal bed mobility: Needs Assistance Bed Mobility: Rolling Rolling: Max assist;+2 for physical assistance         General bed mobility comments: pt able to assist  with use of bed rail, but requires maxA to position and complete movement. Pt able to hold himself in sidelying, but requires assist to reposition and manage bed mobility    Transfers                 General transfer comment: deferred due to safety concerns on stretcher. pt uses lift at baseline      Balance Overall balance assessment: Needs assistance                                           Pertinent Vitals/Pain Pain Assessment: No/denies pain    Home Living Family/patient expects to be discharged to:: Skilled nursing facility                 Additional Comments: pt from [facility], would like to return to different SNF    Prior Function Level of Independence: Needs assistance   Gait / Transfers Assistance Needed: mobilizing to chair intermittently with use of lift, requires assist to complete all bed mobility  ADL's / Homemaking Assistance Needed: requires assist        Hand Dominance   Dominant Hand: Right    Extremity/Trunk Assessment   Upper Extremity Assessment Upper Extremity Assessment: Defer to OT evaluation    Lower Extremity Assessment Lower Extremity Assessment: LLE deficits/detail;Generalized weakness LLE Deficits / Details: chronic L hemiparesis. minimal active movement at L toes    Cervical / Trunk Assessment Cervical / Trunk Assessment: Other exceptions  Cervical / Trunk Exceptions: large body habitus  Communication   Communication: No difficulties (slight garbled speech)  Cognition Arousal/Alertness: Awake/alert Behavior During Therapy: WFL for tasks assessed/performed Overall Cognitive Status: No family/caregiver present to determine baseline cognitive functioning Area of Impairment: Orientation;Memory;Safety/judgement;Problem solving                 Orientation Level: Disoriented to;Situation   Memory: Decreased short-term memory   Safety/Judgement: Decreased awareness of  safety;Decreased awareness of deficits   Problem Solving: Slow processing;Decreased initiation;Requires verbal cues General Comments: pt pleasant and cooperative throughout with decreased insight/memory to state if certain issues (RUE swelling) were new at time of eval. Able to state some details about PLF such as use of lift for transfers, but unable to state when he last received any therapy. follows all commands      General Comments General comments (skin integrity, edema, etc.): significant swelling of RUE around IV site, RN alerted, removed IVs, and planning to remove hospital bracelet due to significant swelling. warm compress applied as forearm cold to the touch.    Exercises General Exercises - Lower Extremity Ankle Circles/Pumps: AROM;Both;5 reps;Supine Quad Sets: AROM;Right;10 reps;Supine Heel Slides: AROM;Right;10 reps;Supine   Assessment/Plan    PT Assessment Patient needs continued PT services  PT Problem List Decreased strength;Decreased activity tolerance;Decreased range of motion;Decreased mobility       PT Treatment Interventions DME instruction;Functional mobility training;Therapeutic activities;Balance training;Therapeutic exercise;Cognitive remediation;Patient/family education    PT Goals (Current goals can be found in the Care Plan section)  Acute Rehab PT Goals Patient Stated Goal: not return to [employer] PT Goal Formulation: With patient Time For Goal Achievement: 04/27/20 Potential to Achieve Goals: Good    Frequency Min 2X/week   Barriers to discharge        Co-evaluation PT/OT/SLP Co-Evaluation/Treatment: Yes Reason for Co-Treatment: For patient/therapist safety;To address functional/ADL transfers PT goals addressed during session: Mobility/safety with mobility;Strengthening/ROM         AM-PAC PT "6 Clicks" Mobility  Outcome Measure Help needed turning from your back to your side while in a flat bed without using bedrails?:  Total Help needed moving from lying on your back to sitting on the side of a flat bed without using bedrails?: Total Help needed moving to and from a bed to a chair (including a wheelchair)?: Total Help needed standing up from a chair using your arms (e.g., wheelchair or bedside chair)?: Total Help needed to walk in hospital room?: Total Help needed climbing 3-5 steps with a railing? : Total 6 Click Score: 6    End of Session   Activity Tolerance: Patient tolerated treatment well Patient left: in bed;with call bell/phone within reach Nurse Communication: Mobility status (RUE swelling) PT Visit Diagnosis: Other abnormalities of gait and mobility (R26.89);Muscle weakness (generalized) (M62.81)    Time: 7416-3845 PT Time Calculation (min) (ACUTE ONLY): 23 min   Charges:   PT Evaluation $PT Eval Low Complexity: 1 Low          Karma Ganja, PT, DPT   Acute Rehabilitation Department Pager #: [phone]  [name] 04/13/2020, 2:15 PM

## 2020-04-13 NOTE — ED Notes (Signed)
Per Dr. Philipp Ovens, this RN paged internal medicine resident per pt BP

## 2020-04-13 NOTE — ED Notes (Signed)
Provider at bedside

## 2020-04-13 NOTE — ED Notes (Signed)
RN paged Internal Medicine per pt oxygen saturation decreasing to 54% on 2L [place]. Pt placed on 3L [place] and now 100%

## 2020-04-13 NOTE — ED Notes (Signed)
Both right hand peripheral IV and right forearm peripheral IV flushed with 10cc NS without difficulty and redressed with transparent dressings.

## 2020-04-13 NOTE — ED Notes (Signed)
IV RN at bedside for IV placement. Phone report called to RN 2C, all questions answered.

## 2020-04-13 NOTE — ED Notes (Signed)
Spoke with provider and she was made aware of patient's decreasing BP. HR remains 104.Patient was sleeping during readings, remains easily arousable to verbal stimuli. Orders to be written for IVF bolus.

## 2020-04-13 NOTE — Progress Notes (Addendum)
HD#0 Subjective:  Overnight Events: Admitted overnight.    States he was admitted for his wounds. Denies shortness of breath or chest pain. " no more than normal." He received shot in his abdoemn that hurt. Great appetite. Having bm's, increased amount of diarrhea. Dark/tarry stools daily. Denies taking any iron or peptobismol. Denies nauesa, vomiting prior to being in admitted. Noticed abdominal swelling since yesterday.  Sometimes feel as though going to pass out. At times he feels dizzy, occurring for "quite a while." That is getting worse. Feeling more weak/tired than usual.   Uses wheelchair. Unable to walk for 2.5 years  Hx of blood clots that were "taken out." Is on eliquis, notes being on warfarin in the past.   Objective:  Vital signs in last 24 hours: Vitals:   04/13/20 0434 04/13/20 0445 04/13/20 0500 04/13/20 0700  BP: (!) 108/55 (!) 108/52 114/63 104/60  Pulse: (!) 50 96 91 95  Resp: 13   15  Temp:      TempSrc:      SpO2: 100% 99% 98% 99%   Supplemental O2: Nasal Cannula SpO2: 99 % O2 Flow Rate (L/min): 3 L/min   Physical Exam:  Physical Exam Constitutional:      Appearance: He is obese. He is ill-appearing.  HENT:     Head: Normocephalic and atraumatic.  Eyes:     Extraocular Movements: Extraocular movements intact.  Cardiovascular:     Rate and Rhythm: Normal rate.     Pulses: Normal pulses.  Pulmonary:     Breath sounds: Rales present.  Abdominal:     General: Bowel sounds are normal. There is distension.     Tenderness: There is no abdominal tenderness. There is no guarding or rebound.  Musculoskeletal:        General: Swelling (1+ biltarelly) present. Normal range of motion.  Skin:    General: Skin is warm and dry.  Neurological:     General: No focal deficit present.     Mental Status: He is alert and oriented to person, place, and time.      There were no vitals filed for this visit.   Intake/Output Summary (Last 24 hours) at  04/13/2020 0757 Last data filed at 04/13/2020 0441 Gross per 24 hour  Intake 2500 ml  Output --  Net 2500 ml   Net IO Since Admission: 2,500 mL [04/13/20 0757]  Pertinent Labs: CBC Latest Ref Rng & Units 04/13/2020 04/12/2020 02/29/2020  WBC 4.0 - 10.5 K/uL 5.5 7.7 4.7  Hemoglobin 13.0 - 17.0 g/dL 9.2(L) 10.7(L) 12.6(L)  Hematocrit 39.0 - 52.0 % 30.8(L) 35.3(L) 40.4  Platelets 150 - 400 K/uL 240 280 154    CMP Latest Ref Rng & Units 04/13/2020 04/12/2020 03/02/2020  Glucose 70 - 99 mg/dL 110(H) 110(H) 250(H)  BUN 8 - 23 mg/dL 10 9 27(H)  Creatinine 0.61 - 1.24 mg/dL 0.46(L) 0.49(L) 0.52(L)  Sodium 135 - 145 mmol/L 139 138 146(H)  Potassium 3.5 - 5.1 mmol/L 3.6 4.7 4.2  Chloride 98 - 111 mmol/L 106 103 112(H)  CO2 22 - 32 mmol/L 24 26 27   Calcium 8.9 - 10.3 mg/dL 8.8(L) 9.0 9.4  Total Protein 6.5 - 8.1 g/dL - 5.7(L) -  Total Bilirubin 0.3 - 1.2 mg/dL - 1.2 -  Alkaline Phos 38 - 126 U/L - 76 -  AST 15 - 41 U/L - 26 -  ALT 0 - 44 U/L - 13 -    Imaging: DG Chest  1 View  Right upper lobe and left lower lobe airspace opacities could reflect atelectasis, pneumonia, or scarring. Findings similar to prior study.   CT CHEST ABDOMEN PELVIS W CONTRAST  1. Trace bilateral pleural effusions, left greater than right. 2. Bilateral areas of parenchymal lung consolidation consistent with scarring and atelectasis. No acute airspace disease. 3. Inflammatory changes at the root of the mesentery, extending to the region of the duodenal sweep. Differential includes duodenitis, peptic ulcer disease, or nonspecific mesenteritis. 4. Large decubitus ulcer overlying the sacrococcygeal junction, with evidence of chronic osteomyelitis at the sacrococcygeal junction. 5. Moderate fecal retention. 6. Stable indeterminate 8 mm left adrenal nodule. 7. Aortic Atherosclerosis    Assessment/Plan:   Principal Problem:   Chronic osteomyelitis (HCC) Active Problems:   Abdominal pain  Upper GI Bleed: Abdominal  pain: Hypotension: Patient presented with abdominal pain, diarrhea, and abdominal distention which is been progressing over the last 2 days.  In the ED he had a CT scan showing inflammatory changes at the root of the mesentery extending to the duodenum.  There was also some moderate fecal retention despite having bowel movement charted in the ED.  Concern for melena given black appearing stool which was documented on the picture of his sacral wound. FOBT was found to be positive and GI was consulted for further evaluation management. Hgb inially 12.6 and decreased to 9.2. Started patient on a PPI drip this morning and discontinued DVT prophylaxis and antiplatelet therapy.  Patient denies any high risk behaviors including chronic NSAID use or alcohol use disorder. -Appreciate GI recs. -Continue PPI IV - D/c dvt ppx and anti-platelet therapy - trend Hgb daily - Transfuse < 7 - Continue fluid resuscitation with LR at 125 cc/hr   Chronic sacral osteomyelitis Patient has sacral osteomyelitis with a ongoing sacral wound.   - Patient will need General surgery consult once stable. - We will get wound care for patient's sacral wound.  COPD - Continue supplemental oxygen - doneb every 6 hours - Dulera 2 puffs twice daily   Heart Failure with preserved EF - hold lasix and losartan in setting of hypotension - I/os, daily weights   Hypertension.  - hold lasix and losartan   Type 2 Diabetes Mellitus - CBG monitoring   History of CVA - Continue statin - hold plavix due to concern for upper GI bleed.    NSLC of RUL s/p SBRT  Follow with oncology last seen by Dr. Julien Nordmann on 04/26/2019. CT with stable paratracheal lymph node, and left pleural effusion.   Seizure disorder - Continue keppra 500 mg daily   Depression - Continue duloxetine, trazodone, and mirtazapine   Goals of care [name] is POA has been concerned with level of care he has been receiving at [facility]. Was  concerned about this during last admission in January. Concerned about worsening sacral wounds and patient's need are being neglected due to short staffing. Considering having patient come live with her rather than at SNF.  - Consult TOC for placement needs  Diet: NPO IVF: LR,125cc/hr VTE: SCDs Code: DNR PT/OT recs: Pending, none. TOC recs: pending Family Update:    Dispo: Anticipated discharge to  pending    Marianna Payment, DO Internal Medicine Resident PGY-2 Pager [phone] Please contact the on call pager after 5 pm and on weekends at [phone].

## 2020-04-13 NOTE — Plan of Care (Signed)
  Problem: Education: Goal: Knowledge of General Education information will improve Description: Including pain rating scale, medication(s)/side effects and non-pharmacologic comfort measures Outcome: Progressing   Problem: Health Behavior/Discharge Planning: Goal: Ability to manage health-related needs will improve Outcome: Progressing   Problem: Clinical Measurements: Goal: Will remain free from infection Outcome: Progressing   

## 2020-04-13 NOTE — ED Notes (Signed)
Both right lower extremity peripheral IV's removed d/t infiltration LR bolus Dr Philipp Ovens notified of situation and tx. "[name]. lower arm with infiltration form LR bolus. able to move arm and hand bur tight swelling and cold to touch noted. Warm compress applied"

## 2020-04-14 DIAGNOSIS — R195 Other fecal abnormalities: Secondary | ICD-10-CM | POA: Diagnosis not present

## 2020-04-14 DIAGNOSIS — R710 Precipitous drop in hematocrit: Secondary | ICD-10-CM | POA: Diagnosis not present

## 2020-04-14 LAB — CBC
HCT: 31.3 % — ABNORMAL LOW (ref 39.0–52.0)
Hemoglobin: 9.4 g/dL — ABNORMAL LOW (ref 13.0–17.0)
MCH: 28.1 pg (ref 26.0–34.0)
MCHC: 30 g/dL (ref 30.0–36.0)
MCV: 93.4 fL (ref 80.0–100.0)
Platelets: 210 10*3/uL (ref 150–400)
RBC: 3.35 MIL/uL — ABNORMAL LOW (ref 4.22–5.81)
RDW: 16.3 % — ABNORMAL HIGH (ref 11.5–15.5)
WBC: 4.4 10*3/uL (ref 4.0–10.5)
nRBC: 0 % (ref 0.0–0.2)

## 2020-04-14 LAB — COMPREHENSIVE METABOLIC PANEL
ALT: 16 U/L (ref 0–44)
AST: 16 U/L (ref 15–41)
Albumin: 2.2 g/dL — ABNORMAL LOW (ref 3.5–5.0)
Alkaline Phosphatase: 61 U/L (ref 38–126)
Anion gap: 10 (ref 5–15)
BUN: 8 mg/dL (ref 8–23)
CO2: 23 mmol/L (ref 22–32)
Calcium: 9.1 mg/dL (ref 8.9–10.3)
Chloride: 106 mmol/L (ref 98–111)
Creatinine, Ser: 0.42 mg/dL — ABNORMAL LOW (ref 0.61–1.24)
GFR, Estimated: >60 mL/min (ref >60)
Glucose, Bld: 102 mg/dL — ABNORMAL HIGH (ref 70–99)
Potassium: 3.1 mmol/L — ABNORMAL LOW (ref 3.5–5.1)
Sodium: 139 mmol/L (ref 135–145)
Total Bilirubin: 0.5 mg/dL (ref 0.3–1.2)
Total Protein: 5.2 g/dL — ABNORMAL LOW (ref 6.5–8.1)

## 2020-04-14 LAB — GLUCOSE, CAPILLARY
Glucose-Capillary: 104 mg/dL — ABNORMAL HIGH (ref 70–99)
Glucose-Capillary: 134 mg/dL — ABNORMAL HIGH (ref 70–99)
Glucose-Capillary: 143 mg/dL — ABNORMAL HIGH (ref 70–99)
Glucose-Capillary: 202 mg/dL — ABNORMAL HIGH (ref 70–99)

## 2020-04-14 MED ORDER — PANTOPRAZOLE SODIUM 40 MG PO TBEC
40.0000 mg | DELAYED_RELEASE_TABLET | Freq: Every day | ORAL | Status: DC
  Administered 2020-04-14 – 2020-04-18 (×5): 40 mg via ORAL
  Filled 2020-04-14 (×5): qty 1

## 2020-04-14 MED ORDER — SODIUM CHLORIDE 0.9 % IV SOLN
1.0000 g | INTRAVENOUS | Status: DC
  Filled 2020-04-14: qty 10

## 2020-04-14 MED ORDER — ORAL CARE MOUTH RINSE
15.0000 mL | Freq: Two times a day (BID) | OROMUCOSAL | Status: DC
  Administered 2020-04-15 – 2020-04-18 (×5): 15 mL via OROMUCOSAL

## 2020-04-14 MED ORDER — POTASSIUM CHLORIDE CRYS ER 20 MEQ PO TBCR
40.0000 meq | EXTENDED_RELEASE_TABLET | Freq: Two times a day (BID) | ORAL | Status: AC
  Administered 2020-04-14 (×2): 40 meq via ORAL
  Filled 2020-04-14 (×2): qty 2

## 2020-04-14 NOTE — Consult Note (Signed)
[facility] Nurse Consult Note: Reason for Consult: Chronic, nonhealing Stage 4 sacral pressure injury with osteomyelitis. Photo provided to EMR by admitting physician; packing in base of wound Wound type:Pressure, infectious Pressure Injury POA: Yes Measurement:To be obtained by bedside RN prior to placement of first dressing change today and documented on nursing flow sheet. Wound bed:100% red, nongranulating Drainage (amount, consistency, odor) small to moderate amount serous to light yellow Periwound: intact with evidence of previous contraction, areas of friability Dressing procedure/placement/frequency: Patient is on a mattress replacement with low air loss feature, will have twice daily and PRN soiling due to fecal incontinence saline gauze moistened wound dressing changes.  Turning side to side is in place.  Bilateral heel pressure redistribution heel boots are ordered today.  If a more aggressive POC is desired for this chronic nonhealing wound, please consult Orthopedics or General Surgery.  [facility] nursing team will not follow, but will remain available to this patient, the nursing and medical teams.  Please re-consult if needed. Thanks, Maudie Flakes, MSN, RN, [place], Arther Abbott  Pager# [phone]

## 2020-04-14 NOTE — Plan of Care (Signed)
  Problem: Clinical Measurements: Goal: Ability to maintain clinical measurements within normal limits will improve Outcome: Progressing Goal: Will remain free from infection Outcome: Progressing Goal: Respiratory complications will improve Outcome: Progressing Goal: Cardiovascular complication will be avoided Outcome: Progressing   Problem: Nutrition: Goal: Adequate nutrition will be maintained Outcome: Progressing   Problem: Pain Managment: Goal: General experience of comfort will improve Outcome: Progressing   Problem: Safety: Goal: Ability to remain free from injury will improve Outcome: Progressing

## 2020-04-14 NOTE — Progress Notes (Addendum)
[facility] Progress Note Covering for Dr. Collene Mares and Dr. Benson Norway   CC:  Anemia, positive FOBT  Subjective: He was sleeping after wound care RN changed his decubitus dressing this morning. He remains a bit drowsy but was easily arousable. He stated he just wants to sleep. He received Toradol prior to decub dressing change. No N/V. No abdominal pain. His RN reported he passed a formed brown stool this morning. No rectal bleeding or melena. He tolerating a heart healthy diet.    Objective:  Vital signs in last 24 hours: Temp:  [97.6 F (36.4 C)-99.6 F (37.6 C)] 97.6 F (36.4 C) (03/06 0715) Pulse Rate:  [81-112] 96 (03/06 0851) Resp:  [15-32] 18 (03/06 0851) BP: (91-129)/(51-65) 121/61 (03/06 0715) SpO2:  [90 %-99 %] 91 % (03/06 0851) Weight:  [105.5 kg] 105.5 kg (03/05 1531) Last BM Date: 04/13/20 General:  Drowsy but arousable in NAD.  Heart: RRR, no murmur.  Pulm:  Breath sounds clear, decreased in the bases.  Abdomen: Obese, distended abdomen. Nontender. Large midline scar intact. + BS x 4 quads.  Extremities: Upper and lower extremities with edema.  Neurologic:  Alert and  oriented x4. Left hemiparesis. Moves RUE/RLEs. Speech is clear.  Psych:  Alert and cooperative. Normal mood and affect.  Intake/Output from previous day: 03/05 0701 - 03/06 0700 In: 4800.5 [P.O.:240; I.V.:977.6; IV Piggyback:3582.9] Out: 1975 [ASNKN:3976] Intake/Output this shift: Total I/O In: 240 [P.O.:240] Out: 100 [Urine:100]  Lab Results: Recent Labs    04/12/20 1609 04/13/20 0327 04/14/20 0448  WBC 7.7 5.5 4.4  HGB 10.7* 9.2* 9.4*  HCT 35.3* 30.8* 31.3*  PLT 280 240 210   BMET Recent Labs    04/12/20 1609 04/13/20 0327 04/14/20 0448  NA 138 139 139  K 4.7 3.6 3.1*  CL 103 106 106  CO2 26 24 23   GLUCOSE 110* 110* 102*  BUN 9 10 8   CREATININE 0.49* 0.46* 0.42*  CALCIUM 9.0 8.8* 9.1   LFT Recent Labs    04/14/20 0448  PROT 5.2*  ALBUMIN 2.2*  AST 16  ALT  16  ALKPHOS 61  BILITOT 0.5    DG Chest 1 View  Result Date: 04/12/2020 . IMPRESSION: Right upper lobe and left lower lobe airspace opacities could reflect atelectasis, pneumonia, or scarring. Findings similar to prior study. Electronically Signed   By: Rolm Baptise M.D.   On: 04/12/2020 16:36   CT CHEST ABDOMEN PELVIS W CONTRAST  Result Date: 04/12/2020  IMPRESSION: 1. Trace bilateral pleural effusions, left greater than right. 2. Bilateral areas of parenchymal lung consolidation consistent with scarring and atelectasis. No acute airspace disease. 3. Inflammatory changes at the root of the mesentery, extending to the region of the duodenal sweep. Differential includes duodenitis, peptic ulcer disease, or nonspecific mesenteritis. 4. Large decubitus ulcer overlying the sacrococcygeal junction, with evidence of chronic osteomyelitis at the sacrococcygeal junction. 5. Moderate fecal retention. 6. Stable indeterminate 8 mm left adrenal nodule. 7. Aortic Atherosclerosis (ICD10-I70.0). Electronically Signed   By: Randa Ngo M.D.   On: 04/12/2020 19:55    Assessment / Plan: 10.  70 year old male with central abdominal pain, bloat with anemia and positive FOBT.  Hemoglobin 10.7 -> 9.2 -> 9.4 (baseline hemoglobin 12.6).  CTAP with contrast 3/4 showed evidence of duodenitis/peptic ulcer disease versus nonspecific mesenteritis without perforation. He passed a normal brown BM this am. No rectal bleeding or melena.  -Pantoprazole 40 mg one p.o. daily -Heart Healthy diet -IV fluids, supportive  care, pain management per the medical team -Recommend EGD and colonoscopy early next week with Dr. Collene Mares or Dr. Benson Norway -Wound care to assess sacral decubitus, would appreciate recommendations regarding wound cover/drsg during bowel prep to prevent stool contamination into sacral wound. Consider placement of flexi seal rectal tube after bowel prep administered.  -Monitory H/H closely -Continue to hold Plavix, last dose  was most likely on 3/4 -Further recommendations per Dr. Carlean Purl  2. History of GERD s/p Nissen Fundoplication. GI Bleeding 08/2018 thought to be due to a single gastric AVM.  -See Plan in # 1   3. History of tubular adenomatous and colon polyps per flexible sigmoidoscopy 08/2018 -See Plan in # 1  4. History of coronary artery disease, CHF  5. History of asthma, COPD on oxygen 2 L nasal cannula. Covid pneumonia 02/2020  6. NSCLCA  7. DM II  8.  Sacral decubitus with chronic osteomyelitis  9. Hypokalemia. K+ 3.1. -KCL replacement per the hospitalist     LOS: 1 day   Noralyn Pick  04/14/2020, 11:13 AM     [facility] Attending   I have taken an interval history, reviewed the chart and examined the patient. I agree with the Advanced Practitioner's note, impression and recommendations.    He is not ready for any procedures at this point. Needs to get stronger. Could possibly end up without work-up depending upon clinical course.  Dr. Collene Mares or Benson Norway will see tomorrow.  Gatha Mayer, MD, [facility] 04/14/2020 3:34 PM

## 2020-04-14 NOTE — Plan of Care (Signed)

## 2020-04-14 NOTE — Progress Notes (Signed)
Subjective:   [name] states that he continues to have diffuse abdominal pain all over that comes and goes and feels like cramping. His main complaint is of severe pain and burning of his sacral wound, which is actively draining some blood prior to dressing change. Endorses chills since being in the hospital. Also notes dysuria. Denies fevers or troubles urinating. Nursing note he has been very tired and he agrees he is fatigued and has had chronic unsteadiness on his feet for 6 months. Right knee pain is new since yesterday.   Objective:  Vital signs in last 24 hours: Vitals:   04/14/20 0851 04/14/20 1129 04/14/20 1426 04/14/20 1533  BP:  113/62  (!) 104/58  Pulse: 96 82  77  Resp: 18 18  17   Temp:  97.6 F (36.4 C)  (!) 97.5 F (36.4 C)  TempSrc:  Oral  Oral  SpO2: 91% 98% 100% 99%  Weight:      Height:       General: Patient appears uncomfortable although in no acute distress.  Eyes: Sclera non-icteric. No conjunctival injection.  HENT: MMM. No nasal discharge. Respiratory: There is slight increased work of breathing. Lungs are CTA, bilaterally. No wheezes, rales, or rhonchi.  Cardiovascular: Regular rate and rhythm. No murmurs, rubs, or gallops. There is 1+ bilateral lower extremity pitting edema to just above the knee.  Musculoskeletal: No lower extremity tenderness to palpation.  Abdominal: Obese. Soft and non-tender to palpation. Bowel sounds intact throughout. No guarding or rebound. Brown stool being cleaned by nursing.  Neurological: Alert and oriented x 3.  Skin: There is a ~6cm x 5cm deep, granulating wound with minimal blood draining from the upper sacrum. No visible bone. Superficial scab with surrounding erythema on the left shin without purulence.  Psych: Normal affect. Normal tone of voice.   Assessment/Plan:  Principal Problem:   Upper GI bleed Active Problems:   Orthostatic hypotension   Abdominal pain   Chronic osteomyelitis (HCC)   GI bleed  GI  Bleeding Hx of IDA Patient presented with central abdominal pain, distention and diarrhea, found to have FOBT positive stools with acute hemoglobin drop to 9.2 yesterday (baseline ~12.6). CTAP 3/4 showed duodenitis vs. PUD vs. Non-specific mesenteritis. GI were consulted, appreciate their recommendations. Patient had incomplete iron deficiency anemia workup in 2020. Hgb stable overnight, tachycardia resolved. Patient had formed brown stool this morning.  - Per GI, will need to get stronger before any procedures can be performed - May end up without inpatient workup depending on clinical course  - Pantoprazole 40mg  PO daily - Continue to hold DVT PPx and Plavix  - IVF, supportive care  - If colonoscopy prep indicated, will need wound care's assistance in keeping decubitus wound covered  - Close H/H monitoring with Hgb goal > 8  Complicated Cystitis  Urinary Retention Patient notes he has been having dysuria, although denies hx of difficulty urinating. Bladder scan showed retention and I&O cath drained 1.2L urine.  - Started on Ceftriaxone 1g IV daily  - Urine culture pending  - Continue bladder scans q4 hours - Will need foley catheter placed if continues to experience retention   Chronic sacral osteomyelitis Large Decubitus Ulceration Patient has chronic sacral osteomyelitis with a granulating decubitus ulcer.  - Discontinued Vancomycin / cefepime  - Patient will need General surgery consult once stable - WOC on board, appreciate their assistance  Hypotension, Resolved  Patient has had dizziness and near-syncope over the past several weeks. Likely in  the setting of anemia vs. Volume loss in the setting of diarrhea. Blood pressures have improved although remain soft. D-dimer positive, although this is chronic without other vital signs / symptoms to suggest PE. Unlikely to be sepsis in the setting of chronic osteomyelitis with sacral wound given he remains afebrile without leukocytosis  despite UTI. Takes Lasix 80mg  and Losartan 25mg  daily at home.   - Will hold fluids and home antihypertensives for now - If hypotension returns, will likely require IVF hydration  COPD SpO2 stable on 2L [place]. - Continue supplemental oxygen with SpO2 goal ~92%  - doneb every 6 hours - Dulera 2 puffs twice daily  Heart Failure with preserved EF -holdlasix andlosartan in setting of soft pressures - I/os, daily weights  Hypokalemia Potassium low at 3.1 this morning. Likely in setting of diarrhea. - Will give 76mEq PO x 2  - Continue to monitor daily BMP  Type 2 Diabetes Mellitus Blood sugars remain stable. - CBG monitoring  History of Embolic CVA Patient on no blood thinners other than Plavix PTA. Likely in setting of prior GI bleeding. - Continue statin - hold plavix due to concern for upper GI bleed  Goals of care [name] is POA has been concerned with level of care he has been receiving at [facility]. Was concerned about this during last admission in January. Concerned about worsening sacral wounds and patient's need are being neglected due to short staffing. Considering having patient come live with her rather than at SNF.  - Consult TOC for placement needs - PT/OT on board  Diet: Heart Healthy IVF: None VTE: SCDs Code: DNR  Prior to Admission Living Arrangement: [place]  Anticipated Discharge Location: SNF Barriers to Discharge: Medical stability / workup  Jeralyn Bennett, MD 04/14/2020, 5:14 PM Pager: [phone] After 5pm on weekdays and 1pm on weekends: On Call pager [phone]

## 2020-04-14 NOTE — Progress Notes (Addendum)
Bladder scan maximum amount at 5 pm 156,   Pt has not voided at all since last in/out catheter at 12.16 pm and pt does not feel discomfort associated with a full bladder, will continue to monitor  Palma Holter, RN

## 2020-04-14 NOTE — Social Work (Signed)
CSW acknowledge the consult for a SNF. With assistance from the RN, CSW spoke with patient to confirm that he was from [facility]. Patient confirmed that he was from [facility] however he did not want to return. Patent appeared somewhat disorientated.CSW received permission to follow up with patient's friend [name]. CSW called [name] and was unable to reach her.  CSW spoke with Juliann Pulse from [facility] and she confirmed that patient was a LTC at the facility.  TOC team will continue to assist with discharge planning needs.

## 2020-04-15 ENCOUNTER — Encounter (HOSPITAL_COMMUNITY): Payer: Self-pay | Admitting: Internal Medicine

## 2020-04-15 DIAGNOSIS — K5900 Constipation, unspecified: Secondary | ICD-10-CM

## 2020-04-15 DIAGNOSIS — K922 Gastrointestinal hemorrhage, unspecified: Secondary | ICD-10-CM | POA: Diagnosis not present

## 2020-04-15 DIAGNOSIS — M8668 Other chronic osteomyelitis, other site: Secondary | ICD-10-CM

## 2020-04-15 DIAGNOSIS — S31000D Unspecified open wound of lower back and pelvis without penetration into retroperitoneum, subsequent encounter: Secondary | ICD-10-CM

## 2020-04-15 DIAGNOSIS — L89150 Pressure ulcer of sacral region, unstageable: Secondary | ICD-10-CM

## 2020-04-15 DIAGNOSIS — R339 Retention of urine, unspecified: Secondary | ICD-10-CM

## 2020-04-15 DIAGNOSIS — S31000A Unspecified open wound of lower back and pelvis without penetration into retroperitoneum, initial encounter: Secondary | ICD-10-CM

## 2020-04-15 DIAGNOSIS — E1149 Type 2 diabetes mellitus with other diabetic neurological complication: Secondary | ICD-10-CM

## 2020-04-15 LAB — GLUCOSE, CAPILLARY
Glucose-Capillary: 92 mg/dL (ref 70–99)
Glucose-Capillary: 145 mg/dL — ABNORMAL HIGH (ref 70–99)
Glucose-Capillary: 96 mg/dL (ref 70–99)
Glucose-Capillary: 116 mg/dL — ABNORMAL HIGH (ref 70–99)

## 2020-04-15 LAB — URINE CULTURE: Culture: >=100000 — AB

## 2020-04-15 LAB — CBC
HCT: 29.4 % — ABNORMAL LOW (ref 39.0–52.0)
Hemoglobin: 9.1 g/dL — ABNORMAL LOW (ref 13.0–17.0)
MCH: 28.5 pg (ref 26.0–34.0)
MCHC: 31 g/dL (ref 30.0–36.0)
MCV: 92.2 fL (ref 80.0–100.0)
Platelets: 210 10*3/uL (ref 150–400)
RBC: 3.19 MIL/uL — ABNORMAL LOW (ref 4.22–5.81)
RDW: 15.9 % — ABNORMAL HIGH (ref 11.5–15.5)
WBC: 4.4 10*3/uL (ref 4.0–10.5)
nRBC: 0 % (ref 0.0–0.2)

## 2020-04-15 LAB — BASIC METABOLIC PANEL
Anion gap: 7 (ref 5–15)
BUN: 10 mg/dL (ref 8–23)
CO2: 24 mmol/L (ref 22–32)
Calcium: 8.9 mg/dL (ref 8.9–10.3)
Chloride: 105 mmol/L (ref 98–111)
Creatinine, Ser: 0.59 mg/dL — ABNORMAL LOW (ref 0.61–1.24)
GFR, Estimated: >60 mL/min (ref >60)
Glucose, Bld: 148 mg/dL — ABNORMAL HIGH (ref 70–99)
Potassium: 4 mmol/L (ref 3.5–5.1)
Sodium: 136 mmol/L (ref 135–145)

## 2020-04-15 LAB — RETICULOCYTES
Immature Retic Fract: 20.9 % — ABNORMAL HIGH (ref 2.3–15.9)
RBC.: 3.35 MIL/uL — ABNORMAL LOW (ref 4.22–5.81)
Retic Count, Absolute: 103.2 10*3/uL (ref 19.0–186.0)
Retic Ct Pct: 3.1 % (ref 0.4–3.1)

## 2020-04-15 LAB — FERRITIN: Ferritin: 91 ng/mL (ref 24–336)

## 2020-04-15 MED ORDER — OXYCODONE HCL 5 MG PO TABS
5.0000 mg | ORAL_TABLET | ORAL | Status: DC | PRN
  Administered 2020-04-15 – 2020-04-17 (×4): 5 mg via ORAL
  Filled 2020-04-15 (×4): qty 1

## 2020-04-15 MED ORDER — POLYETHYLENE GLYCOL 3350 17 G PO PACK: 17.0000 g | PACK | Freq: Every day | ORAL | Status: DC | PRN

## 2020-04-15 MED ORDER — IPRATROPIUM-ALBUTEROL 0.5-2.5 (3) MG/3ML IN SOLN: 3.0000 mL | Freq: Four times a day (QID) | RESPIRATORY_TRACT | Status: DC | PRN

## 2020-04-15 MED ORDER — INSULIN ASPART 100 UNIT/ML ~~LOC~~ SOLN
0.0000 [IU] | Freq: Three times a day (TID) | SUBCUTANEOUS | Status: DC
  Administered 2020-04-17 – 2020-04-18 (×3): 2 [IU] via SUBCUTANEOUS

## 2020-04-15 MED ORDER — CHLORHEXIDINE GLUCONATE CLOTH 2 % EX PADS
6.0000 | MEDICATED_PAD | Freq: Every day | CUTANEOUS | Status: DC
  Administered 2020-04-15 – 2020-04-18 (×4): 6 via TOPICAL

## 2020-04-15 MED ORDER — INSULIN ASPART 100 UNIT/ML ~~LOC~~ SOLN
0.0000 [IU] | SUBCUTANEOUS | Status: DC
  Administered 2020-04-15: 2 [IU] via SUBCUTANEOUS

## 2020-04-15 MED ORDER — ACETAMINOPHEN 650 MG RE SUPP: 650.0000 mg | Freq: Four times a day (QID) | RECTAL | Status: DC

## 2020-04-15 MED ORDER — ENOXAPARIN SODIUM 40 MG/0.4ML ~~LOC~~ SOLN
40.0000 mg | SUBCUTANEOUS | Status: DC
  Administered 2020-04-15 – 2020-04-17 (×3): 40 mg via SUBCUTANEOUS
  Filled 2020-04-15 (×3): qty 0.4

## 2020-04-15 MED ORDER — CEPHALEXIN 500 MG PO CAPS
500.0000 mg | ORAL_CAPSULE | Freq: Four times a day (QID) | ORAL | Status: DC
  Administered 2020-04-15 (×2): 500 mg via ORAL
  Filled 2020-04-15 (×4): qty 1

## 2020-04-15 MED ORDER — ACETAMINOPHEN 325 MG PO TABS
650.0000 mg | ORAL_TABLET | Freq: Four times a day (QID) | ORAL | Status: DC
  Administered 2020-04-15 – 2020-04-18 (×14): 650 mg via ORAL
  Filled 2020-04-15 (×15): qty 2

## 2020-04-15 MED ORDER — SENNOSIDES-DOCUSATE SODIUM 8.6-50 MG PO TABS
1.0000 | ORAL_TABLET | Freq: Two times a day (BID) | ORAL | Status: DC
  Administered 2020-04-15 – 2020-04-16 (×2): 1 via ORAL
  Filled 2020-04-15 (×2): qty 1

## 2020-04-15 MED ORDER — INSULIN ASPART 100 UNIT/ML ~~LOC~~ SOLN: 0.0000 [IU] | Freq: Every day | SUBCUTANEOUS | Status: DC

## 2020-04-15 NOTE — Progress Notes (Signed)
Upon arrival to give patient inhaler this AM patient's nasal cannula was not in patient's nose.  Patient's sats were 94%.  Will leave [place] at beside in case patient needs but currently stable on room air.

## 2020-04-15 NOTE — Plan of Care (Signed)

## 2020-04-15 NOTE — Consult Note (Signed)
Consult Note  [name] 1951-01-11  [id number].    Requesting MD: Dr. Velna Ochs Chief Complaint/Reason for Consult: sacral wound  HPI:  Patient is a 70 year old male who presented to [facility] 04/12/20 from [facility] with abdominal pain and distention. He has been worked up for GI bleeding which seems to have resolved. He is being treated for complicated cystitis and urinary retention with foley and IV abx. Patient also reported worsening pain at site of chronic decubitus wound. General surgery consulted to evaluate chronic wound. CT 3/4 showed chronic osteomyelitis as well as moderate fecal retention. PMH otherwise significant for COPD, recent COVID infection in January 2022, CHF with pEF, T2DM, Hx of CVA.   ROS: Negative other than listed above   Family History  Problem Relation Age of Onset  . Heart attack Mother   . Heart failure Mother   . Cirrhosis Father   . Heart failure Brother   . Cancer Brother   . Hypertension Neg Hx        UNKNOWN  . Stroke Neg Hx        UNKNOWN    Past Medical History:  Diagnosis Date  . Acute blood loss anemia   . Acute CVA (cerebrovascular accident) ([place]) 08/27/2014  . Acute encephalopathy 11/28/2017  . Acute ischemic stroke ([place])   . AKI (acute kidney injury) ([place])   . Alcohol abuse    H/O  . Alcohol dependence with withdrawal with complication ([place]) 07/16/3014  . Anxiety   . Arterial ischemic stroke, MCA (middle cerebral artery), right, acute ([place])   . Arthritis   . Asthma   . Binocular vision disorder with diplopia 01/03/2013  . CAD (coronary artery disease) 05/27/10   Cath: severe single vessell CAD left cx midportion obtuse marginal 2 to 3.  . Cerebral infarction due to embolism of right middle cerebral artery ([place])   . Cerebral infarction due to stenosis of right carotid artery ([place]) 11/01/2014  . Cerebral thrombosis with cerebral infarction 07/18/2016  . Cerebrovascular accident (CVA) due to stenosis of right  carotid artery ([place])   . CHF (congestive heart failure) ([place])   . Closed blow-out fracture of floor of orbit ([place]) 01/19/2013  . COPD (chronic obstructive pulmonary disease) ([place])   . COPD (chronic obstructive pulmonary disease) ([place])   . CVA (cerebral vascular accident) ([place]) 08/26/2014  . Dementia due to another medical condition ([place]) 07/12/2015  . Depression   . Diabetes mellitus, type 2 ([place]) 03/13/2015  . Diastolic CHF, acute on chronic (HCC) 11/01/2014  . DOE (dyspnea on exertion) 11/23/2014  . DVT (deep venous thrombosis) ([place]) 02/22/2014  . Embolic stroke ([place])   . Enophthalmos due to trauma 01/19/2013  . Eyelid lesion 01/24/2013  . Fracture of inferior orbital wall ([place]) 01/03/2013  . Fracture of orbital floor ([place]) 01/03/2013  . GERD (gastroesophageal reflux disease)   . H/O total knee replacement 10/24/2014  . HCAP (healthcare-associated pneumonia) 10/15/2015  . Headache around the eyes 08/27/2014  . History of DVT (deep vein thrombosis)   . History of DVT of lower extremity   . Hyperkalemia 10/24/2014  . Hyperlipemia   . Hyperlipidemia   . Hypertension   . Hypoalbuminemia due to protein-calorie malnutrition ([place])   . Hypotension   . Labile blood pressure   . Left shoulder pain 09/28/2017  . Lower extremity edema 02/21/2014  . Medial orbital wall fracture (HCC) 01/19/2013  . Myocardial infarction ([place]) 2000  . Orbital fracture (  [place]) 12/2012  . OSA on CPAP   . Pain of left eye 01/03/2013  . Peripheral vascular disease, unspecified ([place])    08/20/10 doppler: increase in right ABI post-op. Left ABI stable. S/P bi-fem bypass surgery  . Pneumonia 04/04/2012  . Right middle cerebral artery stroke ([place]) 01/18/2018  . Seizures ([place])   . Shortness of breath   . Stroke (cerebrum) ([place]) 02/06/2015  . Thrombocytopenia ([place])   . Tobacco abuse   . Trichiasis without entropion 04/16/2013   Overview:  LLL central   . Type 2 diabetes mellitus with peripheral neuropathy ([place]) 03/13/2015  .  Urinary incontinence 03/14/2015    Past Surgical History:  Procedure Laterality Date  . abdoninal ao angio & bifem angio  05/27/10   Patent graft, occluded bil stents with no retrograde flow into the hypogastric arteries. 100% occl left ant. tibial artery, 70% to 80% to 100% stenosis right superficial fem artery above adductor canal. 100 % occl right ant. tibial vessell  . Aortogram w/ PTCA  09/292003  . BACK SURGERY    . BIOPSY  08/26/2018   Procedure: BIOPSY;  Surgeon: Lavena Bullion, DO;  Location: [facility];  Service: Gastroenterology;;  . CARDIAC CATHETERIZATION  05/27/10   severe CAD left cx  . CHOLECYSTECTOMY    . ESOPHAGOGASTRIC FUNDOPLICATION    . ESOPHAGOGASTRODUODENOSCOPY (EGD) WITH PROPOFOL N/A 08/26/2018   Procedure: ESOPHAGOGASTRODUODENOSCOPY (EGD) WITH PROPOFOL;  Surgeon: Lavena Bullion, DO;  Location: [facility];  Service: Gastroenterology;  Laterality: N/A;  . EYE SURGERY    . FEMORAL BYPASS  08/19/10   Right Fem-Pop  . FLEXIBLE SIGMOIDOSCOPY N/A 08/26/2018   Procedure: FLEXIBLE SIGMOIDOSCOPY;  Surgeon: Lavena Bullion, DO;  Location: [facility];  Service: Gastroenterology;  Laterality: N/A;  . HOT HEMOSTASIS N/A 08/26/2018   Procedure: HOT HEMOSTASIS (ARGON PLASMA COAGULATION/BICAP);  Surgeon: Lavena Bullion, DO;  Location: [facility];  Service: Gastroenterology;  Laterality: N/A;  . IR ANGIO INTRA EXTRACRAN SEL COM CAROTID INNOMINATE BILAT MOD SED  07/20/2016  . IR ANGIO INTRA EXTRACRAN SEL COM CAROTID INNOMINATE BILAT MOD SED  01/14/2018  . IR ANGIO VERTEBRAL SEL SUBCLAVIAN INNOMINATE UNI R MOD SED  07/20/2016  . IR ANGIO VERTEBRAL SEL VERTEBRAL BILAT MOD SED  01/14/2018  . IR ANGIO VERTEBRAL SEL VERTEBRAL UNI L MOD SED  07/20/2016  . IR RADIOLOGIST EVAL & MGMT  05/27/2016  . IR US GUIDE VASC ACCESS RIGHT  01/14/2018  . JOINT REPLACEMENT    . PERIPHERAL VASCULAR CATHETERIZATION N/A 12/10/2014   Procedure: Abdominal Aortogram;  Surgeon: Angelia Mould, MD;  Location: [facility];  Service: Cardiovascular;  Laterality: N/A;  . POLYPECTOMY  08/26/2018   Procedure: POLYPECTOMY;  Surgeon: Lavena Bullion, DO;  Location: [facility];  Service: Gastroenterology;;  . RADIOLOGY WITH ANESTHESIA N/A 02/08/2015   Procedure: RADIOLOGY WITH ANESTHESIA;  Surgeon: Luanne Bras, MD;  Location: [place];  Service: Radiology;  Laterality: N/A;  . TEE WITHOUT CARDIOVERSION N/A 08/29/2014   Procedure: TRANSESOPHAGEAL ECHOCARDIOGRAM (TEE);  Surgeon: Josue Hector, MD;  Location: [facility]COPY;  Service: Cardiovascular;  Laterality: N/A;  . TOTAL KNEE ARTHROPLASTY      Social History:  reports that he has been smoking cigars and cigarettes. He started smoking about 59 years ago. He has a 25.00 pack-year smoking history. He has quit using smokeless tobacco.  His smokeless tobacco use included chew. He reports current alcohol use. He reports current drug use. Drug: Cocaine.  Allergies:  Allergies  Allergen Reactions  . Darvocet [Propoxyphene N-Acetaminophen] Hives  . Haldol [Haloperidol Decanoate] Hives  . Acetaminophen Nausea Only    Upset stomach, tolerates Hydrocodone/APAP if taken with food  . Metformin And Related Nausea And Vomiting  . Norco [Hydrocodone-Acetaminophen] Nausea And Vomiting    Tolerates if taken with food    Medications Prior to Admission  Medication Sig Dispense Refill  . aluminum-magnesium hydroxide-simethicone (MAALOX) 035-009-38 MG/5ML SUSP Take 30 mLs by mouth every 4 (four) hours as needed (heartburn).    [name] atorvastatin (LIPITOR) 80 MG tablet Take 1 tablet (80 mg total) by mouth daily at 6 PM. (Patient taking differently: Take 80 mg by mouth at bedtime.) 30 tablet 3  . Cholecalciferol 25 MCG (1000 UT) tablet Take 1,000 Units by mouth daily.    . Clobetasol Propionate 0.05 % shampoo Apply 1 application topically 2 (two) times a week. Tuesday and Friday to scalp and beard for eczema    . clopidogrel (PLAVIX) 75 MG  tablet Take 1 tablet (75 mg total) by mouth daily. 30 tablet 3  . Dextromethorphan-guaiFENesin 10-100 MG/5ML liquid Take 10 mLs by mouth every 6 (six) hours as needed (cough).    . docusate sodium (COLACE) 100 MG capsule Take 100 mg by mouth daily.    . DULoxetine (CYMBALTA) 20 MG capsule Take 40 mg by mouth at bedtime.    . ferrous sulfate 325 (65 FE) MG tablet Take 1 tablet (325 mg total) by mouth daily with breakfast.  3  . folic acid (FOLVITE) 1 MG tablet Take 1 tablet (1 mg total) by mouth daily.    . furosemide (LASIX) 40 MG tablet Take 80 mg by mouth daily.    [name] guaiFENesin-dextromethorphan (ROBITUSSIN DM) 100-10 MG/5ML syrup Take 15 mLs by mouth 4 (four) times daily.    [name] ipratropium-albuterol (DUONEB) 0.5-2.5 (3) MG/3ML SOLN Inhale 3 mLs into the lungs every 6 (six) hours.    . levETIRAcetam (KEPPRA) 500 MG tablet Take 1 tablet (500 mg total) by mouth 2 (two) times daily. 60 tablet 3  . losartan (COZAAR) 25 MG tablet Take 1 tablet (25 mg total) by mouth daily. (Patient taking differently: Take 25 mg by mouth 2 (two) times daily. Hold if SBP <100) 30 tablet 2  . Melatonin 5 MG TABS Take 5 mg by mouth at bedtime.    . memantine (NAMENDA) 10 MG tablet Take 1 tablet (10 mg total) by mouth 2 (two) times daily. 6 tablet 0  . mirtazapine (REMERON) 7.5 MG tablet Take 7.5 mg by mouth at bedtime.    [name] OVER THE COUNTER MEDICATION Take 120 mLs by mouth in the morning and at bedtime. Med plus 2.0- after meals for calorie/protein support    . oxyCODONE (OXY IR/ROXICODONE) 5 MG immediate release tablet Take 5 mg by mouth in the morning and at bedtime.    . Pollen Extracts (PROSTAT PO) Take 30 mLs by mouth in the morning and at bedtime.    . polyethylene glycol (MIRALAX / GLYCOLAX) 17 g packet Take 17 g by mouth daily.    . potassium chloride SA (KLOR-CON) 20 MEQ tablet Take 20 mEq by mouth daily.    . pregabalin (LYRICA) 150 MG capsule Take 1 capsule (150 mg total) by mouth 2 (two) times daily. 180  capsule 0  . senna-docusate (SENOKOT-S) 8.6-50 MG tablet Take 2 tablets by mouth at bedtime.    . sodium hypochlorite (DAKIN'S 1/2 STRENGTH) external solution Irrigate with 1 application as directed See  admin instructions. Apply to sacral pressure wound bid    . tiZANidine (ZANAFLEX) 2 MG tablet Take 2 mg by mouth 3 (three) times daily.    . traMADol (ULTRAM) 50 MG tablet Take 50 mg by mouth every 12 (twelve) hours as needed for moderate pain.    . traZODone (DESYREL) 100 MG tablet Take 150 mg by mouth at bedtime.    . triamcinolone (KENALOG) 0.1 % Apply 1 application topically every 12 (twelve) hours. Legs, itching    . WIXELA INHUB 250-50 MCG/DOSE AEPB Inhale 1-2 puffs into the lungs daily. (Patient taking differently: Inhale 1 puff into the lungs in the morning and at bedtime.) 1 each 0    Blood pressure 100/76, pulse 84, temperature 97.7 F (36.5 C), temperature source Oral, resp. rate 19, height 6' (1.829 m), weight 105.5 kg, SpO2 92 %. Physical Exam:  General: pleasant, WD, WN white male who is laying in bed in NAD HEENT: head is normocephalic, atraumatic.  Sclera are noninjected.  PERRL.  Ears and nose without any masses or lesions.  Mouth is pink and moist Heart: regular, rate, and rhythm.   Lungs:  Respiratory effort nonlabored Abd: soft, NT, ND, +BS, no masses, hernias, or organomegaly GU: sacral wound as seen below without purulent drainage or concern for infection    MS: mild edema of LUE and BLE, pressure relief boots to BLE Skin: warm and dry with no masses, lesions, or rashes   Results for orders placed or performed during the hospital encounter of 04/12/20 (from the past 48 hour(s))  MRSA PCR Screening     Status: None   Collection Time: 04/13/20  3:35 PM   Specimen: Nasal Mucosa; Nasopharyngeal  Result Value Ref Range   MRSA by PCR NEGATIVE NEGATIVE    Comment:        The GeneXpert MRSA Assay (FDA approved for NASAL specimens only), is one component of  a comprehensive MRSA colonization surveillance program. It is not intended to diagnose MRSA infection nor to guide or monitor treatment for MRSA infections. Performed at [facility], [place] [address] [place], [place] [address]   Glucose, capillary     Status: Abnormal   Collection Time: 04/13/20  4:46 PM  Result Value Ref Range   Glucose-Capillary 133 (H) 70 - 99 mg/dL    Comment: Glucose reference range applies only to samples taken after fasting for at least 8 hours.  Glucose, capillary     Status: Abnormal   Collection Time: 04/13/20  8:56 PM  Result Value Ref Range   Glucose-Capillary 112 (H) 70 - 99 mg/dL    Comment: Glucose reference range applies only to samples taken after fasting for at least 8 hours.  CBC     Status: Abnormal   Collection Time: 04/14/20  4:48 AM  Result Value Ref Range   WBC 4.4 4.0 - 10.5 K/uL   RBC 3.35 (L) 4.22 - 5.81 MIL/uL   Hemoglobin 9.4 (L) 13.0 - 17.0 g/dL   HCT 31.3 (L) 39.0 - 52.0 %   MCV 93.4 80.0 - 100.0 fL   MCH 28.1 26.0 - 34.0 pg   MCHC 30.0 30.0 - 36.0 g/dL   RDW 16.3 (H) 11.5 - 15.5 %   Platelets 210 150 - 400 K/uL   nRBC 0.0 0.0 - 0.2 %    Comment: Performed at [facility] [address], [place], [place] [address]  Comprehensive metabolic panel     Status: Abnormal  Collection Time: 04/14/20  4:48 AM  Result Value Ref Range   Sodium 139 135 - 145 mmol/L   Potassium 3.1 (L) 3.5 - 5.1 mmol/L   Chloride 106 98 - 111 mmol/L   CO2 23 22 - 32 mmol/L   Glucose, Bld 102 (H) 70 - 99 mg/dL    Comment: Glucose reference range applies only to samples taken after fasting for at least 8 hours.   BUN 8 8 - 23 mg/dL   Creatinine, Ser 0.42 (L) 0.61 - 1.24 mg/dL   Calcium 9.1 8.9 - 10.3 mg/dL   Total Protein 5.2 (L) 6.5 - 8.1 g/dL   Albumin 2.2 (L) 3.5 - 5.0 g/dL   AST 16 15 - 41 U/L   ALT 16 0 - 44 U/L   Alkaline Phosphatase 61 38 - 126 U/L   Total Bilirubin 0.5 0.3 - 1.2 mg/dL   GFR, Estimated >60 >60 mL/min    Comment:  (NOTE) Calculated using the CKD-EPI Creatinine Equation (2021)    Anion gap 10 5 - 15    Comment: Performed at [facility], [place] [address] [place], [place] [address]  Glucose, capillary     Status: Abnormal   Collection Time: 04/14/20  6:25 AM  Result Value Ref Range   Glucose-Capillary 104 (H) 70 - 99 mg/dL    Comment: Glucose reference range applies only to samples taken after fasting for at least 8 hours.  Glucose, capillary     Status: Abnormal   Collection Time: 04/14/20 11:12 AM  Result Value Ref Range   Glucose-Capillary 134 (H) 70 - 99 mg/dL    Comment: Glucose reference range applies only to samples taken after fasting for at least 8 hours.   Comment 1 Notify RN    Comment 2 Document in Chart   Glucose, capillary     Status: Abnormal   Collection Time: 04/14/20  4:11 PM  Result Value Ref Range   Glucose-Capillary 143 (H) 70 - 99 mg/dL    Comment: Glucose reference range applies only to samples taken after fasting for at least 8 hours.   Comment 1 Notify RN    Comment 2 Document in Chart   Glucose, capillary     Status: Abnormal   Collection Time: 04/14/20  8:59 PM  Result Value Ref Range   Glucose-Capillary 202 (H) 70 - 99 mg/dL    Comment: Glucose reference range applies only to samples taken after fasting for at least 8 hours.  Basic metabolic panel     Status: Abnormal   Collection Time: 04/15/20 12:35 AM  Result Value Ref Range   Sodium 136 135 - 145 mmol/L   Potassium 4.0 3.5 - 5.1 mmol/L    Comment: NO VISIBLE HEMOLYSIS   Chloride 105 98 - 111 mmol/L   CO2 24 22 - 32 mmol/L   Glucose, Bld 148 (H) 70 - 99 mg/dL    Comment: Glucose reference range applies only to samples taken after fasting for at least 8 hours.   BUN 10 8 - 23 mg/dL   Creatinine, Ser 0.59 (L) 0.61 - 1.24 mg/dL   Calcium 8.9 8.9 - 10.3 mg/dL   GFR, Estimated >60 >60 mL/min    Comment: (NOTE) Calculated using the CKD-EPI Creatinine Equation (2021)    Anion gap 7 5 - 15    Comment:  Performed at [facility] [address], [place], [place] [address]  CBC     Status: Abnormal   Collection  Time: 04/15/20 12:35 AM  Result Value Ref Range   WBC 4.4 4.0 - 10.5 K/uL   RBC 3.19 (L) 4.22 - 5.81 MIL/uL   Hemoglobin 9.1 (L) 13.0 - 17.0 g/dL   HCT 29.4 (L) 39.0 - 52.0 %   MCV 92.2 80.0 - 100.0 fL   MCH 28.5 26.0 - 34.0 pg   MCHC 31.0 30.0 - 36.0 g/dL   RDW 15.9 (H) 11.5 - 15.5 %   Platelets 210 150 - 400 K/uL   nRBC 0.0 0.0 - 0.2 %    Comment: Performed at [facility] [address] [place], [place] [address]  Glucose, capillary     Status: None   Collection Time: 04/15/20  6:17 AM  Result Value Ref Range   Glucose-Capillary 92 70 - 99 mg/dL    Comment: Glucose reference range applies only to samples taken after fasting for at least 8 hours.  Reticulocytes     Status: Abnormal   Collection Time: 04/15/20  6:40 AM  Result Value Ref Range   Retic Ct Pct 3.1 0.4 - 3.1 %   RBC. 3.35 (L) 4.22 - 5.81 MIL/uL   Retic Count, Absolute 103.2 19.0 - 186.0 K/uL   Immature Retic Fract 20.9 (H) 2.3 - 15.9 %    Comment: Performed at [facility] [address], [place], [place] [address]  Ferritin     Status: None   Collection Time: 04/15/20  6:40 AM  Result Value Ref Range   Ferritin 91 24 - 336 ng/mL    Comment: Performed at [facility] [address], [place], [place] [address]  Glucose, capillary     Status: Abnormal   Collection Time: 04/15/20 11:24 AM  Result Value Ref Range   Glucose-Capillary 145 (H) 70 - 99 mg/dL    Comment: Glucose reference range applies only to samples taken after fasting for at least 8 hours.   Comment 1 Notify RN    Comment 2 Document in Chart    No results found.    Assessment/Plan Stage IV sacral decubitus pressure wound - no signs of overlying infection or abscess - wound bed appears clean with healthy granulation tissue present  - treatment for chronic osteomyelitis per primary or ID - no surgical  debridement recommended at this time - maximize pressure relief with frequent turning and air overlay mattress/geomat - recommend follow up with wound care center if patient is able - continue BID dressing changes and prn if soiled from [facility] - general surgery will sign off  Norm Parcel, [facility] 04/15/2020, 2:24 PM Please see Amion for pager number during day hours 7:00am-4:30pm

## 2020-04-15 NOTE — Progress Notes (Signed)
    Durable Medical Equipment  (From admission, onward)         Start     Ordered   04/15/20 1609  For home use only DME Hospital bed  Once       Question Answer Comment  Length of Need Lifetime   Patient has (list medical condition): chronic sacral osteomylitis, stage 4   The above medical condition requires: Patient requires the ability to reposition frequently   Head must be elevated greater than: 30 degrees   Bed type Semi-electric   Hoyer Lift Yes   Support Surface: Gel Overlay      04/15/20 1624   04/15/20 1601  For home use only DME lightweight manual wheelchair with seat cushion  Once       Comments: Patient suffers from weakness which impairs their ability to perform daily activities like bathing, dressing in the home.  A walker or cane  will not resolve  issue with performing activities of daily living. A wheelchair will allow patient to safely perform daily activities. Patient is not able to propel themselves in the home using a standard weight wheelchair due to weakness. Patient can self propel in the lightweight wheelchair. Length of need lifetime. Accessories: elevating leg rests (ELRs), wheel locks, extensions and anti-tippers.   04/15/20 1602

## 2020-04-15 NOTE — Progress Notes (Signed)
IMTS Progress Note:  Spoke with patient's POA, [name]. She expresses a lot of concern regarding Mr. Ramcharan recent care at his current living facility, stating his needs were not being met and concerned regarding his multiple hospitalizations over the past year. She states she is a CNA and would like to take over his care at home rather than discharge elsewhere. She works from home and is aware that he is not mobile and requires "around the clock care" and will need accommodations such as wheel chair, hospital bed, bed pan, etc. She states she has many CNA friends also willing to help and she is currently planning for his arrival. She wonders whether she may get "CAP" services and a home RN to help her out at home.   Discussed that I will reach out to SW/CM/TOC tomorrow to discuss options and her concerns.  Jeralyn Bennett, MD 04/15/2020, 6:03 PM Pager: [phone]

## 2020-04-15 NOTE — Plan of Care (Signed)

## 2020-04-15 NOTE — TOC Initial Note (Signed)
Transition of Care [facility]) - Initial/Assessment Note    Patient Details  Name: [name] MRN: [id number] Date of Birth: 10-23-1950  Transition of Care [facility]) CM/SW Contact:    Bethann Berkshire, [place] Phone Number: 04/15/2020, 3:04 PM  Clinical Narrative:                  CSW met with pt to discuss discharge plans. Pt states he does not want to return to [facility] SNF. He states he does not get the care that he needs there. Pt wants to go live with his friend [name] and consents to [place] contacting her. Pt has been living at [facility] for LTC for the last 2 years.   CSW called [name]. She confirmed that she would like pt to come live with her but states she would need assistance with arranging equipment and setting up PCS services. CSW explained PCS services may not be completely arranged by time of discharge but that CSW would follow up with beginning PCS application. CSW to follow up with [name] about d/c planning.   Expected Discharge Plan: Home/Self Care Barriers to Discharge: Continued Medical Work up   Patient Goals and CMS Choice Patient states their goals for this hospitalization and ongoing recovery are:: Go live with friend [name]      Expected Discharge Plan and Services Expected Discharge Plan: Home/Self Care       Living arrangements for the past 2 months: [place]                                      Prior Living Arrangements/Services Living arrangements for the past 2 months: [place] Lives with:: Facility Resident Patient language and need for interpreter reviewed:: Yes        Need for Family Participation in Patient Care: Yes (Comment) Care giver support system in place?: Yes (comment)   Criminal Activity/Legal Involvement Pertinent to Current Situation/Hospitalization: No - Comment as needed  Activities of Daily Living Home Assistive Devices/Equipment: None ADL Screening (condition at time of  admission) Patient's cognitive ability adequate to safely complete daily activities?: Yes Is the patient deaf or have difficulty hearing?: No Does the patient have difficulty seeing, even when wearing glasses/contacts?: No Does the patient have difficulty concentrating, remembering, or making decisions?: No Patient able to express need for assistance with ADLs?: Yes Does the patient have difficulty dressing or bathing?: Yes Independently performs ADLs?: No Does the patient have difficulty walking or climbing stairs?: Yes Weakness of Legs: Both Weakness of Arms/Hands: Both  Permission Sought/Granted   Permission granted to share information with : Yes, Verbal Permission Granted  Share Information with NAME: [name] (Friend)   [phone] (Mobile)           Emotional Assessment Appearance:: Appears stated age Attitude/Demeanor/Rapport: Engaged Affect (typically observed): Quiet Orientation: : Oriented to Self,Oriented to Place,Oriented to  Time,Oriented to Situation Alcohol / Substance Use: Not Applicable Psych Involvement: No (comment)  Admission diagnosis:  Cough [R05.9] GI bleed [K92.2] Chronic osteomyelitis (HCC) [M86.60] Abdominal pain [R10.9] Wound of sacral region, subsequent encounter [S31.000D] Constipation, unspecified constipation type [K59.00] Patient Active Problem List   Diagnosis Date Noted  . GI bleed 04/13/2020  . Upper GI bleed   . Chronic osteomyelitis ([place]) 04/12/2020  . Pressure injury of skin 02/27/2020  . Acute respiratory disease due to COVID-19 virus 02/27/2020  . Acute  on chronic respiratory failure with hypoxia ([place]) 02/27/2020  . Pleural effusion, left 02/27/2020  . Renal insufficiency 02/27/2020  . Adenocarcinoma of right lung, stage 1 ([place]) 04/26/2019  . Goals of care, counseling/discussion 10/12/2018  . Gastric and duodenal angiodysplasia   . Polyp of descending colon   . Polyp of rectum   . Iron deficiency anemia   . Abnormal CT  scan, colon   . Heme + stool   . Lung mass   . Symptomatic anemia 08/22/2018  . IV site infection ([place]) 03/31/2018  . Left leg weakness   . Stenosis of brachiocephalic artery ([place]) 78/67/5449  . Lung nodule, solitary 03/28/2018  . Solitary pulmonary nodule on lung CT   . Normocytic anemia 03/26/2018  . Abdominal pain, acute, left upper quadrant   . Dysarthria   . Abdominal pain   . Dementia due to another medical condition, without behavioral disturbance ([place])   . Pruritus   . Left hemiplegia ([place])   . Insomnia 01/29/2016  . Psoriasiform dermatitis 07/12/2015  . Type 2 diabetes mellitus with peripheral neuropathy ([place]) 03/13/2015  . History of Hemiparesis affecting left side as late effect of cerebrovascular accident ([place]) 02/18/2015  . Coronary artery disease involving native coronary artery of native heart without angina pectoris   . History of stroke   . HLD (hyperlipidemia) 11/01/2014  . Tobacco use disorder 11/01/2014  . Carotid stenosis 11/01/2014  . Essential hypertension   . Polysubstance abuse ([place]) 08/28/2014  . Cocaine abuse ([place])   . Intracranial carotid stenosis   . History of DVT (deep vein thrombosis)   . Alcohol use disorder, moderate, dependence ([place])   . Orthostatic hypotension   . Stroke ([place])   . Chronic diastolic congestive heart failure ([place]) 11/21/2013  . GERD (gastroesophageal reflux disease) 08/10/2013  . Gout 08/10/2013  . Peripheral vascular disease ([place]) 07/27/2012  . COPD (chronic obstructive pulmonary disease) ([place]) 04/04/2012   PCP:  Garwin Brothers, MD Pharmacy:   [facility] [place], [place] - [address] [place] [address] Phone: [phone] Fax: [phone]     Social Determinants of Health ([place]) Interventions    Readmission Risk Interventions Readmission Risk Prevention Plan 08/23/2018  Transportation Screening Complete  PCP or Specialist Appt within 3-5 Days Complete  HRI or  [place] Not Complete  Palliative Care Screening Complete  Medication Review (RN Care Manager) Referral to Pharmacy  Some recent data might be hidden

## 2020-04-15 NOTE — Progress Notes (Signed)
Subjective:  No overnight events  [name] is resting comfortably in bed.  He appears chronically ill with generalized weakness.  He admits to some right lower extremity foot pain on the plantar surface.  Patient states that his abdominal pain has significantly improved since yesterday which he attributes resolution of his urinary retention. otherwise he denies any chest pain, shortness of breath, abdominal pain.  Objective:  Vital signs in last 24 hours: Vitals:   04/14/20 1936 04/14/20 1938 04/15/20 0006 04/15/20 0347  BP:  112/61 101/67 112/63  Pulse:  78 83 77  Resp:  20 20 19   Temp:  97.7 F (36.5 C) 98 F (36.7 C) 97.7 F (36.5 C)  TempSrc:  Oral Oral Oral  SpO2: 96% 98% 97% 100%  Weight:      Height:       General: Patient appears chronically ill although in no acute distress.  Eyes: Sclera non-icteric. No conjunctival injection.  HENT: MMM. No nasal discharge. Respiratory: Significant diffuse/generalized rhonchi in upper and lower lung fields with coarse respiratory sounds and thick secretions with coughing. Cardiovascular: Regular rate and rhythm. No murmurs, rubs, or gallops. There is 1 -2+ bilateral lower extremity pitting edema to just above the knee.  Musculoskeletal: No lower extremity tenderness to palpation.  Generalized weakness Abdominal: Obese. Soft and non-tender to palpation. Bowel sounds intact throughout. No guarding or rebound. Brown stool being cleaned by nursing.  Neurological: Alert and oriented x 3.  Skin: There is a ~6cm x 5cm deep, granulating wound with minimal blood draining from the upper sacrum. No visible bone. Superficial scab with surrounding erythema on the left shin without purulence.  Psych: Normal affect. Normal tone of voice.   Assessment/Plan:  Principal Problem:   Upper GI bleed Active Problems:   Orthostatic hypotension   Abdominal pain   Chronic osteomyelitis (HCC)   GI bleed  GI Bleeding Hx of IDA Stable hemoglobin  9.1 without any overt evidence of hematochezia/melena.  Had brown stool this morning.  GI plans for EGD colonoscopy in the near future but may defer to outpatient procedures.   -Continue antiprotozoal 1 mg p.o. daily -We will continue to hold DVT PPx and Plavix due to possible upcoming procedure -Appreciate GIs assistance comanaging this patient - Close H/H monitoring with Hgb goal > 8  Complicated Cystitis, E coli  Urinary Retention He admits to resolution of his abdominal pain and dysuria since starting antibiotics and placement of Foley catheter.  -Continue ceftriaxone 1 g IV daily for 14 days.  On day 4 of 14. -Cultures positive for E. coli sensitive to ceftriaxone -Continue Foley catheter.  - May need urology follow-up  Chronic sacral osteomyelitis Large Decubitus Ulceration GI consulted for chronic sacral osteomyelitis and recommend bowel regimen and offloading of wound.  We will place patient needs debridement at this time.   -Appreciate surgery's recommendations -Continue wound care and offloading of sacral ulcer -We will start bowel regimen today with Senokot and MiraLAX  COPD SpO2 stable on 2L [place].  - Continue supplemental oxygen with SpO2 goal ~92%  - doneb every 6 hours - Dulera 2 puffs twice daily - Will start flutter valve today  Heart Failure with preserved EF -holdlasix andlosartan in setting of soft pressures - I/os, daily weights  Type 2 Diabetes Mellitus Blood sugars remain stable. - CBG monitoring - SSI   Hc of CVA: - hold plavix due to concern for upper GI bleed  Goals of care [name] is POA has been  concerned with level of care he has been receiving at [facility]. Was concerned about this during last admission in January. Concerned about worsening sacral wounds and patient's need are being neglected due to short staffing. Considering having patient come live with her rather than at SNF.  - Consult TOC for placement needs - PT/OT  on board  Diet: Heart Healthy IVF: None VTE: SCDs Code: DNR  Prior to Admission Living Arrangement: [place]  Anticipated Discharge Location: SNF Barriers to Discharge: Medical stability / workup  [name], D.O.  Internal Medicine Resident, PGY-2 [facility]  Pager: [phone] 3:31 PM, 04/15/2020    After 5pm on weekdays and 1pm on weekends: On Call pager [phone]

## 2020-04-15 NOTE — TOC Progression Note (Addendum)
Transition of Care [facility]) - Progression Note    Patient Details  Name: [name] MRN: [id number] Date of Birth: 1950-12-20  [facility]) CM/SW Contact  Zenon Mayo, RN Phone Number: 04/15/2020, 3:52 PM  Clinical Narrative:    NCM informed by CSW, Cyrus that friend of patient , Eulogio Bear would like for patient go go home with PCS services.  NCM contacted her she states she is is HPOA. Her phone [phone].  She states she wiill do all she can to help this patient. He does not want to go back to [facility] SNF where he was previous to this Hospitalization.  She states she would like for [place] to start the [facility] paperwork.  NCM contacted the MD to fill out the [facility] paperwork.  Ms Rosezena Sensor states he will also need a hospital bed with hoyer lift, w/chair, comdom cath, bedpain and urinal, and she will need script for a bedside table so she can purchase from the medical supply store. She states the address patient will be going to is [address] [place] [id number], her phone number will be the contact number.  NCM informed MD about the PCS form needing to be filled out, MD states to leave it with the secretary on [place] which I did.  NCM made referral to [facility] with [facility] for the hospital bed and the w/chair.   Expected Discharge Plan: Home/Self Care Barriers to Discharge: Continued Medical Work up  Expected Discharge Plan and Services Expected Discharge Plan: Home/Self Care       Living arrangements for the past 2 months: [place]                                       Social Determinants of Health (SDOH) Interventions    Readmission Risk Interventions Readmission Risk Prevention Plan 08/23/2018  Transportation Screening Complete  PCP or Specialist Appt within 3-5 Days Complete  HRI or [place] Not Complete  Palliative Care Screening Complete  Medication Review (RN Care Manager) Referral to Pharmacy  Some recent data might be  hidden

## 2020-04-15 NOTE — Progress Notes (Signed)
Sacral wound dressing change per wound care orders. Patient tolerated well

## 2020-04-16 DIAGNOSIS — K922 Gastrointestinal hemorrhage, unspecified: Secondary | ICD-10-CM | POA: Diagnosis not present

## 2020-04-16 DIAGNOSIS — K5900 Constipation, unspecified: Secondary | ICD-10-CM | POA: Diagnosis not present

## 2020-04-16 DIAGNOSIS — S31000D Unspecified open wound of lower back and pelvis without penetration into retroperitoneum, subsequent encounter: Secondary | ICD-10-CM | POA: Diagnosis not present

## 2020-04-16 DIAGNOSIS — M8668 Other chronic osteomyelitis, other site: Secondary | ICD-10-CM | POA: Diagnosis not present

## 2020-04-16 LAB — BASIC METABOLIC PANEL
Anion gap: 6 (ref 5–15)
BUN: 7 mg/dL — ABNORMAL LOW (ref 8–23)
CO2: 27 mmol/L (ref 22–32)
Calcium: 9.2 mg/dL (ref 8.9–10.3)
Chloride: 109 mmol/L (ref 98–111)
Creatinine, Ser: 0.46 mg/dL — ABNORMAL LOW (ref 0.61–1.24)
GFR, Estimated: >60 mL/min (ref >60)
Glucose, Bld: 107 mg/dL — ABNORMAL HIGH (ref 70–99)
Potassium: 4 mmol/L (ref 3.5–5.1)
Sodium: 142 mmol/L (ref 135–145)

## 2020-04-16 LAB — CBC
HCT: 28.7 % — ABNORMAL LOW (ref 39.0–52.0)
Hemoglobin: 8.7 g/dL — ABNORMAL LOW (ref 13.0–17.0)
MCH: 28.3 pg (ref 26.0–34.0)
MCHC: 30.3 g/dL (ref 30.0–36.0)
MCV: 93.5 fL (ref 80.0–100.0)
Platelets: 224 10*3/uL (ref 150–400)
RBC: 3.07 MIL/uL — ABNORMAL LOW (ref 4.22–5.81)
RDW: 16 % — ABNORMAL HIGH (ref 11.5–15.5)
WBC: 4.1 10*3/uL (ref 4.0–10.5)
nRBC: 0 % (ref 0.0–0.2)

## 2020-04-16 LAB — GLUCOSE, CAPILLARY
Glucose-Capillary: 89 mg/dL (ref 70–99)
Glucose-Capillary: 109 mg/dL — ABNORMAL HIGH (ref 70–99)
Glucose-Capillary: 114 mg/dL — ABNORMAL HIGH (ref 70–99)
Glucose-Capillary: 145 mg/dL — ABNORMAL HIGH (ref 70–99)

## 2020-04-16 MED ORDER — FUROSEMIDE 40 MG PO TABS
40.0000 mg | ORAL_TABLET | Freq: Every day | ORAL | Status: DC
  Administered 2020-04-16 – 2020-04-18 (×3): 40 mg via ORAL
  Filled 2020-04-16 (×3): qty 1

## 2020-04-16 MED ORDER — CEPHALEXIN 500 MG PO CAPS
500.0000 mg | ORAL_CAPSULE | ORAL | Status: DC
  Administered 2020-04-16 – 2020-04-18 (×11): 500 mg via ORAL
  Filled 2020-04-16 (×13): qty 1

## 2020-04-16 MED ORDER — SENNOSIDES-DOCUSATE SODIUM 8.6-50 MG PO TABS: 1.0000 | ORAL_TABLET | Freq: Every evening | ORAL | Status: DC | PRN

## 2020-04-16 MED ORDER — CLOPIDOGREL BISULFATE 75 MG PO TABS
75.0000 mg | ORAL_TABLET | Freq: Every day | ORAL | Status: DC
  Administered 2020-04-16 – 2020-04-18 (×3): 75 mg via ORAL
  Filled 2020-04-16 (×3): qty 1

## 2020-04-16 NOTE — TOC Progression Note (Signed)
Transition of Care [facility]) - Progression Note    Patient Details  Name: [name] MRN: [id number] Date of Birth: 1950-08-24  Transition of Care [facility]) CM/SW Contact  Zenon Mayo, RN Phone Number: 04/16/2020, 2:16 PM  Clinical Narrative:    NCM spoke with POA Eulogio Bear, she states she has not heard from anyone about the DME yet.  NCM offered choice for [facility] services, she states she has 3 choices.  NCM will see if they are available to take referral.  NCM contacted [employer],  Spoke with Angus Palms she states she spoke with Ms Rosezena Sensor yesterday and informed her that [facility] the facility where patient came from ordered a w/chair for him in 2018 which has been less then 5 years ago and his insurance will not pay for another one unless it is more than 5 years ago.  Angus Palms states she will contact the patient now and discuss the coordination of the delivery of the hospital bed.    Expected Discharge Plan: Home/Self Care Barriers to Discharge: Continued Medical Work up  Expected Discharge Plan and Services Expected Discharge Plan: Home/Self Care       Living arrangements for the past 2 months: [place]                                       Social Determinants of Health (SDOH) Interventions    Readmission Risk Interventions Readmission Risk Prevention Plan 08/23/2018  Transportation Screening Complete  PCP or Specialist Appt within 3-5 Days Complete  HRI or [place] Not Complete  Palliative Care Screening Complete  Medication Review (RN Care Manager) Referral to Pharmacy  Some recent data might be hidden

## 2020-04-16 NOTE — Progress Notes (Signed)
Subjective:   [name] continues to endorse pain over his sacral wound. Denies abdominal pain or bloating and states that he has had more frequent bowel movements since started on bowel regimen yesterday. Denies lightheadedness, dizziness, LE swelling, fevers, chills or any other symptoms. He agrees he would like to receive his care from his POA, [name].   Objective:  Vital signs in last 24 hours: Vitals:   04/15/20 1600 04/15/20 1911 04/15/20 2347 04/16/20 0300  BP: (!) 122/58  (!) 117/52 (!) 117/59  Pulse: 82  89 86  Resp: 19  19 17   Temp: 97.6 F (36.4 C)  98.2 F (36.8 C) 97.6 F (36.4 C)  TempSrc: Oral  Oral Oral  SpO2: 94% 91% 99% 100%  Weight:      Height:       General: Patient appears chronically ill although in no acute distress.  Eyes: Sclera non-icteric. No conjunctival injection.  HENT: MMM. No nasal discharge. Respiratory: Improved breath sounds, now breathing comfortably on room air, although bilateral rhonchi persist. No active cough.  Cardiovascular: Regular rate and rhythm. No murmurs, rubs, or gallops. There is trace bilateral lower extremity pitting edema to the knee.  Musculoskeletal: No lower extremity tenderness to palpation. Generalized weakness Abdominal: Obese. Soft and non-tender to palpation. Bowel sounds intact throughout. No guarding or rebound.  GU: Foley in place Neurological: Alert and oriented x 3.  Skin: Sacral wound covered. Right elbow lesion with yellow discharge covered by bandage.  Psych: Normal affect. Normal tone of voice.   Assessment/Plan:  Principal Problem:   Upper GI bleed Active Problems:   Orthostatic hypotension   Abdominal pain   Pressure injury of skin   Chronic osteomyelitis (HCC)   GI bleed   Sacral wound  GI Bleeding Hx of IDA Hemoglobin decreasing slightly although overall stable at 8.7 without melena/hematochezia noted in hospital. GI do not feel patient requires inpatient workup for this (or urgent  outpatient follow up in clinic) given his stability. Patient has hypoproliferative anemia.   -Continue PO PPI daily -Will resume Plavix today  -Close H/H monitoring with Hgb goal > 8 -Will likely need outpatient iron replacement; will hold off given active infection  Complicated Cystitis, E coli  Urinary Retention Abdominal pain has improved significantly since placement of foley catheter. Switched from cefepime to ceftriaxone to cephalexin PO. Will treat with 7 days total ABX.  -Continue Cephalexin PO 500mg  BID (Day 5/7)  -Continue Foley catheter -Will require urology follow up outpatient for voiding trials   Chronic sacral osteomyelitis Stage IV Sacral Decubitus Pressure Wound Surgery evaluated yesterday and wound thought to be healing well without signs of infection. Recommend no surgical debridement.   - Continue BID dressing changes and PRN if soiled from BM, per wound care - Maximize pressure relief with frequent turning and air overlay mattress / geomat when in chair   COPD Breathing has improved, currently saturating 100% on room air. Feels breathing is back to baseline.  - Hold O2 only for SpO2 >92% - Doneb every 6 hours - Dulera 2 puffs twice daily - Continue flutter valve  Heart Failure with preserved EF Patient on Lasix 80mg  and Losartan daily at home, initially held due to soft pressures although this has improved.  - Will give Lasix 40mg  PO once  - Give second dose Lasix 40mg  PO based on response to above - Hold Losartan for now - Strict I/os, daily weights  Type 2 Diabetes Mellitus Blood sugars remain stable. No  outpatient medications for this. - CBG monitoring - SSI   Hc of CVA: - Restart Plavix   Goals of care Patient and his POA prefer he go home to [place] who is a CNA rather than back to SNF due to concerns for neglect.  - Consult TOC for placement needs - PT/OT on board  Diet: Heart Healthy IVF: None VTE: Lovenox Code: DNR  Prior to  Admission Living Arrangement: [place]  Anticipated Discharge Location: Home to [facility]; will need DME equipment and [facility] RN / Personal Care Services Barriers to Discharge: Medication management and placement arrangements   Kenneth Bennett, MD Internal Medicine Resident, PGY-1 [facility]  Pager: [phone] 6:00 AM, 04/16/2020    After 5pm on weekdays and 1pm on weekends: On Call pager [phone]

## 2020-04-16 NOTE — Plan of Care (Signed)

## 2020-04-16 NOTE — Progress Notes (Addendum)
Physical Therapy Treatment/ Discharge Patient Details Name: [name] MRN: [id number] DOB: [birth year] Today's Date: 04/16/2020    History of Present Illness The pt is a 70 yo male presenting 3/4 with reports of increased abdominal distension in last few weeks. Pt found to have sacral wound with osteomyelitis since January admission and concern for upper GI bleed. PMH includes: CVA (with L-sided weakness), alcohol abuse, prior tobacco and cocaine use, CAD, CHF, COPD, DM II, DVT, OSA on CPAP, PVD, and seizures.    PT Comments    Pt pleasant and very willing to move with therapy. Pt reports improved function since admission and back to baseline. Pt able to roll with min assist and assist with sliding toward HOB tilted. Pt with bil hip contracture in external rotation and knee flexion limited to 30 and 45 degrees. Pt encouraged to continue to move bil LE and UE as much as possible in bed and continue to assist with bed mobility. Noted pt plans to return home with 24hr aide not back to SNF. If pt has bed, WC and hoyer lift he is currently at a +1 mobility level for assist at home with lift. Pt at baseline functional level without further acute therapy needs and will sign off with pt aware and agreeable.     Follow Up Recommendations  Supervision/Assistance - 24 hour;SNF     Equipment Recommendations  Hospital bed;Wheelchair (measurements PT);Wheelchair cushion (measurements PT);Other (comment) (hoyer)    Recommendations for Other Services       Precautions / Restrictions Precautions Precautions: Fall Precaution Comments: L hemiparesis from prior stroke, bil hips with decreased internal rotation and limited knee flexion <45degrees, sacral wound    Mobility  Bed Mobility Overal bed mobility: Needs Assistance Bed Mobility: Rolling Rolling: Min assist         General bed mobility comments: cues for sequence with assist to grasp rail bil, assist for right knee flexion to roll left.  Pt able to roll bil with min assist with use of pad. pt slide to [facility] in trendelenburg with bed rail min assist    Transfers                 General transfer comment: deferred, pt uses lift at baseline. Transitioned from supine to sit via bed features  Ambulation/Gait                 Stairs             Wheelchair Mobility    Modified Rankin (Stroke Patients Only)       Balance                                            Cognition Arousal/Alertness: Awake/alert Behavior During Therapy: WFL for tasks assessed/performed Overall Cognitive Status: No family/caregiver present to determine baseline cognitive functioning                       Memory: Decreased short-term memory   Safety/Judgement: Decreased awareness of safety;Decreased awareness of deficits   Problem Solving: Slow processing;Decreased initiation;Requires verbal cues General Comments: pt pleasant and cooperative and reports non ambulatory and requiring a lift for years. Pt able to follow commands with delay and repetition of cues      Exercises General Exercises - Lower Extremity Ankle Circles/Pumps: AROM;PROM;10 reps;Supine (AROM on RLE) Heel Slides: AAROM;Right;Left;Supine;10  reps (right knee limited to 30 degrees and left knee 45 degrees of flexion) Hip ABduction/ADduction: AAROM;Both;10 reps;Supine Straight Leg Raises: AAROM;Right;10 reps;Supine    General Comments        Pertinent Vitals/Pain Pain Assessment: 0-10 Pain Score: 3  Pain Location: left knee Pain Descriptors / Indicators: Aching Pain Intervention(s): Limited activity within patient's tolerance;Repositioned    Home Living                      Prior Function            PT Goals (current goals can now be found in the care plan section) Progress towards PT goals: Goals met/education completed, patient discharged from PT    Frequency           PT Plan Current plan  remains appropriate    Co-evaluation              AM-PAC PT "6 Clicks" Mobility   Outcome Measure  Help needed turning from your back to your side while in a flat bed without using bedrails?: A Little Help needed moving from lying on your back to sitting on the side of a flat bed without using bedrails?: Total Help needed moving to and from a bed to a chair (including a wheelchair)?: Total Help needed standing up from a chair using your arms (e.g., wheelchair or bedside chair)?: Total Help needed to walk in hospital room?: Total Help needed climbing 3-5 steps with a railing? : Total 6 Click Score: 8    End of Session   Activity Tolerance: Patient tolerated treatment well Patient left: in bed;with call bell/phone within reach;with bed alarm set Nurse Communication: Mobility status;Need for lift equipment PT Visit Diagnosis: Other abnormalities of gait and mobility (R26.89);Muscle weakness (generalized) (M62.81)     Time: 7290-2111 PT Time Calculation (min) (ACUTE ONLY): 15 min  Charges:  $Therapeutic Exercise: 8-22 mins                     Maija P, PT Acute Rehabilitation Services Pager: [phone] Office: [phone]    Maija B Prater 04/16/2020, 9:28 AM

## 2020-04-16 NOTE — Progress Notes (Signed)
  Date: 04/16/2020  Patient name: [name]  Medical record number: [id number]  Date of birth: [birth year]        I have seen and evaluated this patient and I have discussed the plan of care with the house staff. Please see Dr. Lenis Noon note for complete details. I concur with her findings and plan.   Sid Falcon, MD 04/16/2020, 5:45 PM

## 2020-04-17 DIAGNOSIS — S31000D Unspecified open wound of lower back and pelvis without penetration into retroperitoneum, subsequent encounter: Secondary | ICD-10-CM | POA: Diagnosis not present

## 2020-04-17 DIAGNOSIS — M8668 Other chronic osteomyelitis, other site: Secondary | ICD-10-CM | POA: Diagnosis not present

## 2020-04-17 DIAGNOSIS — K922 Gastrointestinal hemorrhage, unspecified: Secondary | ICD-10-CM | POA: Diagnosis not present

## 2020-04-17 DIAGNOSIS — K5900 Constipation, unspecified: Secondary | ICD-10-CM | POA: Diagnosis not present

## 2020-04-17 LAB — GLUCOSE, CAPILLARY
Glucose-Capillary: 86 mg/dL (ref 70–99)
Glucose-Capillary: 128 mg/dL — ABNORMAL HIGH (ref 70–99)
Glucose-Capillary: 113 mg/dL — ABNORMAL HIGH (ref 70–99)
Glucose-Capillary: 150 mg/dL — ABNORMAL HIGH (ref 70–99)

## 2020-04-17 LAB — CBC
HCT: 27.4 % — ABNORMAL LOW (ref 39.0–52.0)
Hemoglobin: 8.7 g/dL — ABNORMAL LOW (ref 13.0–17.0)
MCH: 29.1 pg (ref 26.0–34.0)
MCHC: 31.8 g/dL (ref 30.0–36.0)
MCV: 91.6 fL (ref 80.0–100.0)
Platelets: 244 10*3/uL (ref 150–400)
RBC: 2.99 MIL/uL — ABNORMAL LOW (ref 4.22–5.81)
RDW: 16.1 % — ABNORMAL HIGH (ref 11.5–15.5)
WBC: 4.7 10*3/uL (ref 4.0–10.5)
nRBC: 0 % (ref 0.0–0.2)

## 2020-04-17 LAB — CULTURE, BLOOD (ROUTINE X 2)
Culture: NO GROWTH
Culture: NO GROWTH

## 2020-04-17 LAB — BASIC METABOLIC PANEL
Anion gap: 8 (ref 5–15)
BUN: 10 mg/dL (ref 8–23)
CO2: 27 mmol/L (ref 22–32)
Calcium: 9 mg/dL (ref 8.9–10.3)
Chloride: 108 mmol/L (ref 98–111)
Creatinine, Ser: 0.47 mg/dL — ABNORMAL LOW (ref 0.61–1.24)
GFR, Estimated: >60 mL/min (ref >60)
Glucose, Bld: 130 mg/dL — ABNORMAL HIGH (ref 70–99)
Potassium: 3.9 mmol/L (ref 3.5–5.1)
Sodium: 143 mmol/L (ref 135–145)

## 2020-04-17 NOTE — Consult Note (Signed)
   [facility] CM Inpatient Consult   04/17/2020  [name] 11-Jul-1950 [id number]  [facility] [ACO] Patient: [name]   Patient screened for hospitalization with noted extreme high risk score for unplanned readmission risk and for transition to assess for potential [place] [facility] service needs for post hospital transition.  Review of patient's medical record reveals patient is declining to return to his LTC SNF and home with his friend per notes.  Plan:  Follow up with inpatient [facility] RNCM regarding post hospital and attempt to speak with patient in hospital room without success. Will assign for community follow up since disposition was for SNF and now for home with difficulty finding home health per inpatient TOC RNCM.  For questions contact:   Natividad Brood, RN BSN [facility] Liaison  [phone] business mobile phone Toll free office [phone]  Fax number: [phone] [email or web address]

## 2020-04-17 NOTE — TOC Progression Note (Signed)
Transition of Care [facility]) - Progression Note    Patient Details  Name: [name] MRN: [id number] Date of Birth: 1951/01/20  Transition of Care [facility] CM/SW Contact  Zenon Mayo, RN Phone Number: 04/17/2020, 1:50 PM  Clinical Narrative:    Per Nursing documentation on flow sheet for wounds on 3/5, Length is 9cm, Width is 7 cm and Depth is 7 cm,  Wound surface is 63 cm, wound volume is 441 cm, dressing is moist to dry foam, BID.    Expected Discharge Plan: Home/Self Care Barriers to Discharge: Continued Medical Work up  Expected Discharge Plan and Services Expected Discharge Plan: Home/Self Care       Living arrangements for the past 2 months: [place]                                       Social Determinants of Health (SDOH) Interventions    Readmission Risk Interventions Readmission Risk Prevention Plan 08/23/2018  Transportation Screening Complete  PCP or Specialist Appt within 3-5 Days Complete  HRI or [place] Not Complete  Palliative Care Screening Complete  Medication Review (RN Care Manager) Referral to Pharmacy  Some recent data might be hidden

## 2020-04-17 NOTE — Discharge Summary (Signed)
Name: [name] MRN: [id number] DOB: [birth year]  PCP: Gaylan Gerold, DO  Date of Admission: 04/12/2020  3:20 PM Date of Discharge:  04/18/2020 Attending Physician: Dr. Daryll Drown  Discharge Diagnosis:  1. Stage IV Sacral Decubitus Ulcer  2. Chronic Sacral Osteomyelitis  3. Moderate Constipation  4. Complicated Cystitis  5. Urinary Retention  Discharge Medications:  Allergies as of 04/18/2020      Reactions   Darvocet [propoxyphene N-acetaminophen] Hives   Haldol [haloperidol Decanoate] Hives   Acetaminophen Nausea Only   Upset stomach, tolerates Hydrocodone/APAP if taken with food   Metformin And Related Nausea And Vomiting   Norco [hydrocodone-acetaminophen] Nausea And Vomiting   Tolerates if taken with food      Medication List    STOP taking these medications   losartan 25 MG tablet Commonly known as: COZAAR   OVER THE COUNTER MEDICATION   PROSTAT PO   tiZANidine 2 MG tablet Commonly known as: ZANAFLEX   traMADol 50 MG tablet Commonly known as: ULTRAM   traZODone 100 MG tablet Commonly known as: DESYREL     TAKE these medications   aluminum-magnesium hydroxide-simethicone 017-494-49 MG/5ML Susp Commonly known as: MAALOX Take 30 mLs by mouth every 4 (four) hours as needed (heartburn).   atorvastatin 80 MG tablet Commonly known as: LIPITOR Take 1 tablet (80 mg total) by mouth daily at 6 PM. What changed: when to take this   cephALEXin 500 MG capsule Commonly known as: KEFLEX Take 1 capsule (500 mg total) by mouth 4 (four) times daily for 3 days.   Chlorhexidine Gluconate Cloth 2 % Pads Apply 6 each topically daily. Start taking on: April 19, 2020   Cholecalciferol 25 MCG (1000 UT) tablet Take 1,000 Units by mouth daily.   Clobetasol Propionate 0.05 % shampoo Apply 1 application topically 2 (two) times a week. Tuesday and Friday to scalp and beard for eczema   clopidogrel 75 MG tablet Commonly known as: PLAVIX Take 1 tablet (75 mg total) by  mouth daily.   docusate sodium 100 MG capsule Commonly known as: COLACE Take 100 mg by mouth daily.   DULoxetine 20 MG capsule Commonly known as: CYMBALTA Take 2 capsules (40 mg total) by mouth at bedtime.   ferrous sulfate 325 (65 FE) MG tablet Take 1 tablet (325 mg total) by mouth daily with breakfast.   folic acid 1 MG tablet Commonly known as: FOLVITE Take 1 tablet (1 mg total) by mouth daily.   furosemide 40 MG tablet Commonly known as: LASIX Take 1 tablet (40 mg total) by mouth daily. Start taking on: April 19, 2020 What changed: how much to take   guaiFENesin-dextromethorphan 100-10 MG/5ML syrup Commonly known as: ROBITUSSIN DM Take 15 mLs by mouth 4 (four) times daily.   Dextromethorphan-guaiFENesin 10-100 MG/5ML liquid Take 10 mLs by mouth every 6 (six) hours as needed (cough).   ipratropium-albuterol 0.5-2.5 (3) MG/3ML Soln Commonly known as: DUONEB Inhale 3 mLs into the lungs every 6 (six) hours.   levETIRAcetam 500 MG tablet Commonly known as: Keppra Take 1 tablet (500 mg total) by mouth 2 (two) times daily.   melatonin 5 MG Tabs Take 5 mg by mouth at bedtime.   memantine 10 MG tablet Commonly known as: NAMENDA Take 1 tablet (10 mg total) by mouth 2 (two) times daily.   methocarbamol 750 MG tablet Commonly known as: Robaxin-750 Take 1 tablet (750 mg total) by mouth 4 (four) times daily.   mirtazapine 7.5 MG tablet Commonly known  as: REMERON Take 1 tablet (7.5 mg total) by mouth at bedtime.   oxyCODONE 5 MG immediate release tablet Commonly known as: Oxy IR/ROXICODONE Take 1 tablet (5 mg total) by mouth every 4 (four) hours as needed for severe pain. What changed:   when to take this  reasons to take this   pantoprazole 20 MG tablet Commonly known as: PROTONIX Take 1 tablet (20 mg total) by mouth daily.   polyethylene glycol 17 g packet Commonly known as: MiraLax Take 17 g by mouth daily. What changed: Another medication with the same  name was removed. Continue taking this medication, and follow the directions you see here.   potassium chloride SA 20 MEQ tablet Commonly known as: KLOR-CON Take 1 tablet (20 mEq total) by mouth daily.   pregabalin 150 MG capsule Commonly known as: LYRICA Take 1 capsule (150 mg total) by mouth 2 (two) times daily.   senna-docusate 8.6-50 MG tablet Commonly known as: Senokot-S Take 2 tablets by mouth at bedtime.   sodium hypochlorite external solution Commonly known as: DAKIN'S 1/2 STRENGTH Irrigate with 1 application as directed See admin instructions. Apply to sacral pressure wound bid   triamcinolone 0.1 % Commonly known as: KENALOG Apply 1 application topically every 12 (twelve) hours. Legs, itching   Wixela Inhub 250-50 MCG/DOSE Aepb Generic drug: Fluticasone-Salmeterol Inhale 1-2 puffs into the lungs daily. What changed:   how much to take  when to take this   zinc oxide 20 % ointment Apply 1 application topically 2 (two) times daily. What changed: additional instructions   zinc oxide 20 % ointment Apply 1 application topically 2 (two) times daily. Day and night shift to diaper area What changed: You were already taking a medication with the same name, and this prescription was added. Make sure you understand how and when to take each.            Durable Medical Equipment  (From admission, onward)         Start     Ordered   04/17/20 0927  For home use only DME Hospital bed  Once       Question Answer Comment  Length of Need Lifetime   Patient has (list medical condition): chronic sacral osteomylitis, stage 4   The above medical condition requires: Patient requires the ability to reposition frequently   Head must be elevated greater than: 30 degrees   Bed type Semi-electric   Hoyer Lift Yes   Support Surface: Low Air loss Mattress      04/17/20 0927   04/16/20 0000  For home use only DME Other see comment       Comments: Bed pads and briefs.   Question:  Length of Need  Answer:  Lifetime   04/16/20 1442   04/15/20 1601  For home use only DME lightweight manual wheelchair with seat cushion  Once       Comments: Patient suffers from weakness which impairs their ability to perform daily activities like bathing, dressing in the home.  A walker or cane  will not resolve  issue with performing activities of daily living. A wheelchair will allow patient to safely perform daily activities. Patient is not able to propel themselves in the home using a standard weight wheelchair due to weakness. Patient can self propel in the lightweight wheelchair. Length of need lifetime. Accessories: elevating leg rests (ELRs), wheel locks, extensions and anti-tippers.   04/15/20 1602  Discharge Care Instructions  (From admission, onward)         Start     Ordered   04/18/20 0000  Discharge wound care:       Comments: Wound care to sacral Stage 4 pressure injury:   - Cleanse with normal saline, pat dry.   - Fill defect to skin level with saline moistened roll gauze (Kerlix)  - Cover with a non-adhesive pad and paper tape - Please keep bottom padded and off load ever 2 hours to avoid further pressure injury.,  - Change bandages once to twice daily and PRN soiling.   - DON'T FORGET: Turn patient side to side   04/18/20 1242         Disposition and follow-up:   [name] was discharged from [facility] in Stable condition.  At the hospital follow up visit please address:  1.  Follow-up: See below  2.  Labs / imaging needed at time of follow-up: Mg, Phos, CBG, Basic Metabolic Profile and CBC  3.  Pending labs/ test needing follow-up: none  Follow-up Appointments:  Follow-up Information    Garwin Brothers, MD. Schedule an appointment as soon as possible for a visit.   Specialty: Internal Medicine Why: Please schedule a hospital follow up appointment within 1-2 weeks for medication reconciliation and  assessment. Contact information: [address] [place] [address] [phone]        [facility]. Schedule an appointment as soon as possible for a visit in 2 week(s).   Why: Please schedule a follow up appointment with [facility] in about 2 weeks for follow up. Please keep your foley in place until your appointment. Contact information: [place] [facility]             . Schedule an appointment as soon as possible for a visit in 1 week(s).   Why: Please call to schedule wound care follow up.  Contact information: [address] [address] [phone]       Care, [facility] Follow up.   Why: HHRN,HHPT Contact information: [place] [address] [place] Hospital Course by problem list:  GI Bleed Chronic IDA Patient initially presented due to abdominal pain, bloating, diarrhea with single episode of melena with FOBT postive stool and mild hypotension concerning for upper GI bleed. Considering patient has a history of GI bleeds, GI was consulted for EGD/Colonoscopy and plavix was held. Patients blood pressure and anemia improved without intervention or additional melena in the hospital. DVT ppx and plavix were restarted.   Follow up: GI recommends following up with PCP for further evaluation.  Stage IV Decubitus Ulcer  Chronic Sacral Osteomyelitis Patient presented with sacral decubitus ulcer that developed during his stay at a skilled nursing facility. CT scan of abdomen and pelvis showed chronic osteomyelitis. Wound care was consulted for BID dressing changes and PRN for stools, and surgery evaluated the patient and determined there was no indication for acute surgery and no signs of acute infection.  Follow up: Needs home wound care and [facility] RN. Continue  monitoring for signs of infection with short course of oxycodone for pain control. Consider future general surgery consultation if doesn't improve.   Complicated Cystitis Urinary  Retention Patient presented with abdominal pain and dysuria with a UA showing signs and symptoms concerning for urinary tract infection. He was started on ceftriaxone with urine cultures grew E. coli sensitive to Keflex. Patient was transitioned to oral antibiotics to complete a 10-day course. Foley was left in place due to concern regarding stretch injury as initial I&O drained 1.2L urine, and discharged with instruction to call for urology appointment outpatient for voiding trials.   Follow up: Needs to finish course of Keflex. Will need follow up with urology for voiding trials (foley left in place)   HFpEF: Patient has a history of heart failure with preserved ejection fraction on furosemide 80 mg daily at home. This was initially held due to concern for hypotension. Considering his soft blood pressures patient was restarted on 40 mg of furosemide in the hospital with good urine output. We will continue this dose in the outpatient setting. We instructed the patient to follow-up with his outpatient provider to adjust his Lasix dose as needed to maintain euvolemia.  Follow up: Assess fluid status on Lasix 40mg  daily.   HTN: Patient's hypertension medications were held during this hospitalization due to hypotension/soft pressures.  We will continue to hold losartan 25 mg at discharge and counseled patient to follow-up with his outpatient provider for restarting this medication as needed.  Follow up: Will need BP reevaluated. Losartan was held at discharge due to low pressures. May need to be restarted.  Hx of CVA: Patient has history of ischemic and embolic stroke with a prior history of GI bleeds and is on Plavix 75 mg daily.  Patient's Plavix was held for a few days during his hospitalization due to concern for upper  GI bleed and hypotension.  GI was consulted for upper endoscopy and colonoscopy.  Patient's hemoglobin stabilized and blood pressure improved.  Therefore, Plavix was restarted prior to discharge.  Follow up: make sure patient is taking Plavix and assess need for anticoagulation.  Discharge Vitals:   BP (!) 146/74 (BP Location: Right Arm)   Pulse 90   Temp 98.2 F (36.8 C) (Oral)   Resp (!) 21   Ht 6' (1.829 m)   Wt 105.5 kg   SpO2 92%   BMI 31.54 kg/m   Subjective on Day of Discharge:  Mr. Woerner states he is doing well this morning. Does endorse some pain on his bottom and states he has not had a BM in two days although states this is typical for him and pain is well-controlled after oxycodone. When asked if he was ready to go home, he is in agreement. Plan to discharge home with POA.  Discharge Physical Exam: General: Patient appears well in no acute distress.  Eyes: Sclera non-icteric. No conjunctival injection.  Respiratory: Breathing comfortable on room air despite bilateral coarse rhonchi throughout all lung fields. No tachypnea or active cough.  Cardiovascular: Regular rate and rhythm. No murmurs, rubs, or gallops. There is trace bilateral lower extremity pitting edema to the knee. Extremities warm and well-perfused. Abdominal: Obese. Soft and non-tender to palpation. No guarding or rebound.  Skin: Scabbed lesion overlying left shin with minimal surrounding erythema but no active drainage.  GU: Foley in place. Neurological: Alert and oriented    Pertinent Labs, Studies, and Procedures:  CBC Latest Ref Rng & Units 04/18/2020 04/17/2020 04/16/2020  WBC 4.0 - 10.5 K/uL 5.1 4.7 4.1  Hemoglobin 13.0 - 17.0 g/dL 9.3(L) 8.7(L) 8.7(L)  Hematocrit 39.0 - 52.0 % 29.3(L) 27.4(L) 28.7(L)  Platelets 150 -  400 K/uL 245 244 224    CMP Latest Ref Rng & Units 04/17/2020 04/16/2020 04/15/2020  Glucose 70 - 99 mg/dL 130(H) 107(H) 148(H)  BUN 8 - 23 mg/dL 10 7(L) 10  Creatinine 0.61 - 1.24 mg/dL  0.47(L) 0.46(L) 0.59(L)  Sodium 135 - 145 mmol/L 143 142 136  Potassium 3.5 - 5.1 mmol/L 3.9 4.0 4.0  Chloride 98 - 111 mmol/L 108 109 105  CO2 22 - 32 mmol/L 27 27 24   Calcium 8.9 - 10.3 mg/dL 9.0 9.2 8.9  Total Protein 6.5 - 8.1 g/dL - - -  Total Bilirubin 0.3 - 1.2 mg/dL - - -  Alkaline Phos 38 - 126 U/L - - -  AST 15 - 41 U/L - - -  ALT 0 - 44 U/L - - -   DG Chest 1 View  Result Date: 04/12/2020 CLINICAL DATA:  Cough EXAM: CHEST  1 VIEW COMPARISON:  02/27/2020 FINDINGS: Cardiomegaly. Airspace opacity in the right upper lobe and left base, similar to prior study. No visible effusions. No overt edema. No acute bony abnormality. IMPRESSION: Right upper lobe and left lower lobe airspace opacities could reflect atelectasis, pneumonia, or scarring. Findings similar to prior study. Electronically Signed   By: Rolm Baptise M.D.   On: 04/12/2020 16:36   CT CHEST ABDOMEN PELVIS W CONTRAST  Result Date: 04/12/2020 CLINICAL DATA:  Increasing abdominal pain and distension, rales on physical exam EXAM: CT CHEST, ABDOMEN, AND PELVIS WITH CONTRAST TECHNIQUE: Multidetector CT imaging of the chest, abdomen and pelvis was performed following the standard protocol during bolus administration of intravenous contrast. CONTRAST:  11mL OMNIPAQUE IOHEXOL 300 MG/ML  SOLN COMPARISON:  04/12/2020, 02/27/2020 FINDINGS: CT CHEST FINDINGS Cardiovascular: There is a trace pericardial effusion. The heart is not enlarged. No evidence of thoracic aortic aneurysm or dissection. Extensive atherosclerosis throughout the aorta and coronary vessels. High-grade stenosis at the origin of the innominate artery. Mediastinum/Nodes: Stable borderline enlarged right paratracheal lymph node measuring 10 mm in short axis. No other pathologic adenopathy. Thyroid, trachea, and esophagus are grossly unremarkable. Small hiatal hernia incidentally seen. Lungs/Pleura: There are trace bilateral pleural effusions, left greater than right. Dependent  areas of atelectasis are seen within the lower lobes. Linear consolidation within the right upper lobe abutting the minor fissure most consistent with scarring. No pneumothorax. Central airways are patent. Musculoskeletal: No acute or destructive bony lesions. 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 Reconstructed images demonstrate no additional findings. CT ABDOMEN PELVIS FINDINGS Hepatobiliary: No focal liver abnormality is seen. Status post cholecystectomy. No biliary dilatation. Pancreas: Unremarkable. No pancreatic ductal dilatation or surrounding inflammatory changes. Spleen: Normal in size without focal abnormality. Adrenals/Urinary Tract: Stable indeterminate 8 mm left adrenal nodule. The right adrenals unremarkable. Chronic scarring lower pole left kidney. Otherwise the kidneys enhance normally and symmetrically. No urinary tract calculi or obstructive uropathy. Bladder is unremarkable. Stomach/Bowel: No bowel obstruction or ileus. Moderate stool throughout the colon. Normal appendix right lower quadrant. There is fat stranding at the root of the mesentery along the duodenal sweep, nonspecific. Findings could reflect duodenitis or peptic ulcer disease in the appropriate clinical setting, though nonspecific enteritis or mesenteric panniculitis can give a similar appearance. No bowel wall thickening. Vascular/Lymphatic: Extensive atherosclerosis of the upper abdominal aorta and its branches. Postsurgical changes from aortobifemoral bypass. No pathologic adenopathy. Numerous subcentimeter lymph nodes at the root of the mesentery are likely reactive. Reproductive: Prostate is unremarkable. Other: There is a large decubitus ulcer overlying the sacrococcygeal  junction, extending to the dorsal cortex of the sacrococcygeal junction. Irregularity of the bony margin consistent with chronic osteomyelitis. No free intraperitoneal fluid or free gas. No abdominal wall hernia.  Musculoskeletal: No acute displaced fractures. Reconstructed images demonstrate no additional findings. IMPRESSION: 1. Trace bilateral pleural effusions, left greater than right. 2. Bilateral areas of parenchymal lung consolidation consistent with scarring and atelectasis. No acute airspace disease. 3. Inflammatory changes at the root of the mesentery, extending to the region of the duodenal sweep. Differential includes duodenitis, peptic ulcer disease, or nonspecific mesenteritis. 4. Large decubitus ulcer overlying the sacrococcygeal junction, with evidence of chronic osteomyelitis at the sacrococcygeal junction. 5. Moderate fecal retention. 6. Stable indeterminate 8 mm left adrenal nodule. 7. Aortic Atherosclerosis (ICD10-I70.0). Electronically Signed   By: Randa Ngo M.D.   On: 04/12/2020 19:55     Discharge Instructions: Discharge Instructions    Ambulatory referral to Wound Clinic   Complete by: As directed    For home use only DME Other see comment   Complete by: As directed    Bed pads and briefs.   Length of Need: Lifetime      Signed:  Jeralyn Bennett, MD Internal Medicine Resident, PGY-1 [facility]  Pager: [phone] 11:18 AM, 04/18/2020

## 2020-04-17 NOTE — Care Management Important Message (Signed)
Important Message  Patient Details  Name: [name] MRN: [id number] Date of Birth: 05/28/1950   Medicare Important Message Given:  Yes     [name] 04/17/2020, 2:33 PM

## 2020-04-17 NOTE — Plan of Care (Signed)
  Problem: Education: Goal: Knowledge of General Education information will improve Description: Including pain rating scale, medication(s)/side effects and non-pharmacologic comfort measures Outcome: Progressing   Problem: Clinical Measurements: Goal: Ability to maintain clinical measurements within normal limits will improve Outcome: Progressing Goal: Will remain free from infection Outcome: Progressing Goal: Diagnostic test results will improve Outcome: Progressing Goal: Respiratory complications will improve Outcome: Progressing Goal: Cardiovascular complication will be avoided Outcome: Progressing   Problem: Nutrition: Goal: Adequate nutrition will be maintained Outcome: Progressing   Problem: Coping: Goal: Level of anxiety will decrease Outcome: Progressing   Problem: Elimination: Goal: Will not experience complications related to bowel motility Outcome: Progressing Goal: Will not experience complications related to urinary retention Outcome: Progressing   Problem: Pain Managment: Goal: General experience of comfort will improve Outcome: Progressing   Problem: Safety: Goal: Ability to remain free from injury will improve Outcome: Progressing

## 2020-04-17 NOTE — Progress Notes (Signed)
Sacral pressure wound cleansed and dressing changed per orders. Pt tolerated procedure well. Will continue to monitor.

## 2020-04-17 NOTE — Progress Notes (Signed)
    Durable Medical Equipment  (From admission, onward)         Start     Ordered   04/17/20 0927  For home use only DME Hospital bed  Once       Question Answer Comment  Length of Need Lifetime   Patient has (list medical condition): chronic sacral osteomylitis, stage 4   The above medical condition requires: Patient requires the ability to reposition frequently   Head must be elevated greater than: 30 degrees   Bed type Semi-electric   Hoyer Lift Yes   Support Surface: Low Air loss Mattress      04/17/20 0927   04/16/20 0000  For home use only DME Other see comment       Comments: Bed pads and briefs.  Question:  Length of Need  Answer:  Lifetime   04/16/20 1442   04/15/20 1601  For home use only DME lightweight manual wheelchair with seat cushion  Once       Comments: Patient suffers from weakness which impairs their ability to perform daily activities like bathing, dressing in the home.  A walker or cane  will not resolve  issue with performing activities of daily living. A wheelchair will allow patient to safely perform daily activities. Patient is not able to propel themselves in the home using a standard weight wheelchair due to weakness. Patient can self propel in the lightweight wheelchair. Length of need lifetime. Accessories: elevating leg rests (ELRs), wheel locks, extensions and anti-tippers.   04/15/20 1602

## 2020-04-17 NOTE — Hospital Course (Addendum)
GI Bleed: Iron Deficiency Anemia: Patient presented with acute drop in hemoglobin with FOBT postive stools and mild hypotension concerning for upper GI bleed. Considering patient has a history of GI bleeds, GI was consulted for EGD/Colonoscopy and plavix was held. Patients blood pressure and anemia improved without intervention. DVT ppx and plavix were restarted. GI recommends following up with PCP for further evaluation   Chronic Sacral Osteomyelitis:  Patient presented with an unstageable sacral decubitus ulcer that developed during his stay had a skilled nursing facility.  Wound initially looked clean with pink base and minimal discharge.  CT scan of abdomen and pelvis showed chronic osteomyelitis.  No antibiotics for surgical debridement currently needed . General surgery was consulted and recommended wound care.  We will continue wound care in the outpatient setting.  Follow up: Needs home wound care and PCP follow up for possible referral to wound care clinic. May need General Surgery evaluation.  Urinary tract infection: Patient presented with abdominal pain and dysuria with a UA showing signs and symptoms concerning for urinary tract infection.  He was started on ceftriaxone with urine cultures growing E. coli sensitive to Keflex.  Patient was transitioned to oral antibiotics to complete a 7-day course.  Follow up: Needs to finish course of Keflex  Urinary Retention: Patient presented with acute urinary retention with bladder scan showing 1200 cc of fluid retention that improved with Foley catheter placement.  We will keep Foley catheter in place at discharge due to risk of overdistention injury.  Patient will need follow-up with outpatient urology for post void residual assessment.  Follow up: Needs outpatient Urology appointment   HFpEF: Patient has a history of heart failure with preserved ejection fraction on furosemide 80 mg daily.  This was initially held due to concern for  hypotension.  Considering his soft blood pressures patient was restarted on 40 mg of furosemide in the hospital with good urine output.  We will continue this dose in the outpatient setting.  We instructed the patient to follow-up with his outpatient provider to adjust his Lasix dose as needed to maintain euvolemia.  Follow up: Needs home lasix dose adjusted. Was on 80 mg prior to admission and was discharged on 40 mg.  HTN: She has a history of hypertension.  His hypertension medications were held during this hospitalization due to hypotension.  We will continue to hold losartan 25 mg at discharge and counseled patient to follow-up with his outpatient provider for restarting this medication as needed.  Follow up: Will need BP reevaluated. Losartan was held at discharge. May need to be restarted.  Hx of CVA: Patient has history of ischemic and embolic stroke with a prior history of GI bleeds and is on Plavix 75 mg daily.  Patient's Plavix was held for a few days during his hospitalization due to concern for upper GI bleed and hypotension.  GI was consulted for upper endoscopy and colonoscopy.  Patient's hemoglobin stabilized and blood pressure improved.  Therefore, Plavix was restarted prior to discharge.  Follow up: make sure patient is taking Plavix
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
## 2020-04-17 NOTE — Consult Note (Signed)
WOC Nurse Consult Note: Patient receiving care in [name]. Reason for Consult: sacral wound discharge recommendations I have communicated with the Medical Team via [place] that the surgical PA's recommendations for continued wound care can be found in her note from 04/15/20. [place] nurse will not follow at this time.  Please re-consult the [place] team if needed.  Val Riles, RN, MSN, CWOCN, CNS-BC, pager [phone]

## 2020-04-18 ENCOUNTER — Telehealth: Payer: Self-pay

## 2020-04-18 ENCOUNTER — Other Ambulatory Visit: Payer: Self-pay | Admitting: Physician Assistant

## 2020-04-18 DIAGNOSIS — M8668 Other chronic osteomyelitis, other site: Secondary | ICD-10-CM | POA: Diagnosis not present

## 2020-04-18 DIAGNOSIS — K922 Gastrointestinal hemorrhage, unspecified: Secondary | ICD-10-CM | POA: Diagnosis not present

## 2020-04-18 DIAGNOSIS — K5900 Constipation, unspecified: Secondary | ICD-10-CM

## 2020-04-18 DIAGNOSIS — R339 Retention of urine, unspecified: Secondary | ICD-10-CM | POA: Insufficient documentation

## 2020-04-18 DIAGNOSIS — S31000D Unspecified open wound of lower back and pelvis without penetration into retroperitoneum, subsequent encounter: Secondary | ICD-10-CM | POA: Diagnosis not present

## 2020-04-18 LAB — CBC
HCT: 29.3 % — ABNORMAL LOW (ref 39.0–52.0)
Hemoglobin: 9.3 g/dL — ABNORMAL LOW (ref 13.0–17.0)
MCH: 28.9 pg (ref 26.0–34.0)
MCHC: 31.7 g/dL (ref 30.0–36.0)
MCV: 91 fL (ref 80.0–100.0)
Platelets: 245 10*3/uL (ref 150–400)
RBC: 3.22 MIL/uL — ABNORMAL LOW (ref 4.22–5.81)
RDW: 15.9 % — ABNORMAL HIGH (ref 11.5–15.5)
WBC: 5.1 10*3/uL (ref 4.0–10.5)
nRBC: 0.4 % — ABNORMAL HIGH (ref 0.0–0.2)

## 2020-04-18 LAB — GLUCOSE, CAPILLARY
Glucose-Capillary: 107 mg/dL — ABNORMAL HIGH (ref 70–99)
Glucose-Capillary: 127 mg/dL — ABNORMAL HIGH (ref 70–99)
Glucose-Capillary: 136 mg/dL — ABNORMAL HIGH (ref 70–99)

## 2020-04-18 MED ORDER — DULOXETINE HCL 20 MG PO CPEP: 40.0000 mg | ORAL_CAPSULE | Freq: Every day | ORAL | 0 refills | Status: DC

## 2020-04-18 MED ORDER — ZINC OXIDE 20 % EX OINT: 1.0000 "application " | TOPICAL_OINTMENT | Freq: Two times a day (BID) | CUTANEOUS | 0 refills | Status: DC

## 2020-04-18 MED ORDER — FUROSEMIDE 40 MG PO TABS: 40.0000 mg | ORAL_TABLET | Freq: Every day | ORAL | 1 refills | Status: DC

## 2020-04-18 MED ORDER — WIXELA INHUB 250-50 MCG/DOSE IN AEPB: 1.0000 | INHALATION_SPRAY | Freq: Every day | RESPIRATORY_TRACT | 0 refills | Status: DC

## 2020-04-18 MED ORDER — OXYCODONE HCL 5 MG PO TABS: 5.0000 mg | ORAL_TABLET | ORAL | 0 refills | Status: DC | PRN

## 2020-04-18 MED ORDER — MEMANTINE HCL 10 MG PO TABS: 10.0000 mg | ORAL_TABLET | Freq: Two times a day (BID) | ORAL | 0 refills | Status: DC

## 2020-04-18 MED ORDER — METHOCARBAMOL 750 MG PO TABS: 750.0000 mg | ORAL_TABLET | Freq: Four times a day (QID) | ORAL | 0 refills | Status: AC

## 2020-04-18 MED ORDER — SENNOSIDES-DOCUSATE SODIUM 8.6-50 MG PO TABS: 2.0000 | ORAL_TABLET | Freq: Every day | ORAL | 0 refills | Status: DC

## 2020-04-18 MED ORDER — LEVETIRACETAM 500 MG PO TABS: 500.0000 mg | ORAL_TABLET | Freq: Two times a day (BID) | ORAL | 0 refills | Status: DC

## 2020-04-18 MED ORDER — ATORVASTATIN CALCIUM 80 MG PO TABS: 80.0000 mg | ORAL_TABLET | Freq: Every day | ORAL | 3 refills | Status: DC

## 2020-04-18 MED ORDER — DAKINS (1/2 STRENGTH) 0.25 % EX SOLN: 1.0000 "application " | CUTANEOUS | 0 refills | Status: DC

## 2020-04-18 MED ORDER — PREGABALIN 150 MG PO CAPS: 150.0000 mg | ORAL_CAPSULE | Freq: Two times a day (BID) | ORAL | 0 refills | Status: DC

## 2020-04-18 MED ORDER — CEPHALEXIN 500 MG PO CAPS
500.0000 mg | ORAL_CAPSULE | Freq: Four times a day (QID) | ORAL | 0 refills | Status: AC
Start: 2020-04-18 — End: 2020-04-21

## 2020-04-18 MED ORDER — CLOPIDOGREL BISULFATE 75 MG PO TABS: 75.0000 mg | ORAL_TABLET | Freq: Every day | ORAL | 3 refills | Status: DC

## 2020-04-18 MED ORDER — POTASSIUM CHLORIDE CRYS ER 20 MEQ PO TBCR: 20.0000 meq | EXTENDED_RELEASE_TABLET | Freq: Every day | ORAL | 0 refills | Status: DC

## 2020-04-18 MED ORDER — CHLORHEXIDINE GLUCONATE CLOTH 2 % EX PADS: 6.0000 | MEDICATED_PAD | Freq: Every day | CUTANEOUS | 0 refills | Status: AC

## 2020-04-18 MED ORDER — MIRTAZAPINE 7.5 MG PO TABS: 7.5000 mg | ORAL_TABLET | Freq: Every day | ORAL | 0 refills | Status: DC

## 2020-04-18 MED FILL — CEPHALEXIN 500 MG CAPS: 500 | 3 days supply | Qty: 14 | Fill #0

## 2020-04-18 MED FILL — POTASSIUM CHLORIDE 20meqER: 20 | 30 days supply | Qty: 30 | Fill #0

## 2020-04-18 MED FILL — CLOPIDOGREL 75 MG TABLET: 75 | 30 days supply | Qty: 30 | Fill #0

## 2020-04-18 MED FILL — oxyCODONE HCL 5 MG TABS: 5 | 5 days supply | Qty: 30 | Fill #0

## 2020-04-18 MED FILL — DULoxetine HCL 20 MG CPEP: 20 | 30 days supply | Qty: 60 | Fill #0

## 2020-04-18 MED FILL — METHOCARBAMOL 750 MG TABS: 750 | 30 days supply | Qty: 120 | Fill #0

## 2020-04-18 MED FILL — SENEXON-S 8.6-50 MG TABS: 8.6-50 | 30 days supply | Qty: 60 | Fill #0

## 2020-04-18 MED FILL — MIRTAZAPINE 7.5 MG TABLET: 7.5 | 30 days supply | Qty: 30 | Fill #0

## 2020-04-18 MED FILL — PANTOPRAZOLE SOD DR 20 MG T: 20 | 30 days supply | Qty: 30 | Fill #0

## 2020-04-18 MED FILL — FUROSEMIDE 40 MG TABLET: 40 | 30 days supply | Qty: 30 | Fill #0

## 2020-04-18 MED FILL — POLYETHYLENE GLYCOL 3350 PO: 17 | 14 days supply | Qty: 238 | Fill #0

## 2020-04-18 NOTE — Consult Note (Signed)
   [facility] CM Inpatient Consult   04/18/2020  [name] 07/18/1950 [id number]   Follow up:  Disposition/Transition   Attempts to reach patient by hospital phone unsuccessful, will make a referral for [place] Management Coordinator for complex disease management.  For questions,   Natividad Brood, RN BSN [facility] Liaison  [phone] business mobile phone Toll free office [phone]  Fax number: [phone] [name]

## 2020-04-18 NOTE — TOC Transition Note (Addendum)
Transition of Care [facility]) - CM/SW Discharge Note   Patient Details  Name: [name] MRN: [id number] Date of Birth: [birth year]  [facility] CM/SW Contact:  Zenon Mayo, RN Phone Number: 04/18/2020, 10:01 AM   Clinical Narrative:    Patient is for dc today home via [place], to [address] Eppie Gibson [place] , NCM  Confirmed address with POA , Ms Juanita Craver.  Also she is ok with [facility] for [facility] services for [facility] [place].  Patient will have follow up apt with Internal Medicine Clinic.  DME (hospital bed with hoyer lift and air mattress was delivered to home yesterday.  PCS was expedited on yesterday thru [facility] for [facility]. On [address] [place].  NCM notified MD for orders for [facility] and [place].  TOC is doing his medications, will be delivered to room. PTAR has been scheduled, Staff RN notified.  Ms Juanita Craver would like to know when ptar gets here.    Final next level of care: [place] Barriers to Discharge: Continued Medical Work up   Patient Goals and CMS Choice Patient states their goals for this hospitalization and ongoing recovery are:: live with friend [name] CMS Medicare.gov Compare Post Acute Care list provided to:: Patient Represenative (must comment) Choice offered to / list presented to : [place] / [place]  Discharge Placement                       Discharge Plan and Services                DME Arranged: Hospital bed (bed pads, briefs, hoyer lift) DME Agency: [employer] Date DME Agency Contacted: 04/15/20 Time DME Agency Contacted: 1000 Representative spoke with at DME Agency: [place]: RN,PT [place] Agency: [place] Date [place]: 04/18/20 Time [place]: 1001 Representative spoke with at [place]: [place] ([place]) Interventions     Readmission Risk Interventions Readmission Risk Prevention Plan 08/23/2018   Transportation Screening Complete  PCP or Specialist Appt within 3-5 Days Complete  HRI or [place] Not Complete  Palliative Care Screening Complete  Medication Review (RN Care Manager) Referral to Pharmacy  Some recent data might be hidden

## 2020-04-18 NOTE — Telephone Encounter (Signed)
Dr Konrad Penta requested  A hospital F/U for pt being discharged 04/18/20.[name] Appt given 05/02/20/ @ 1:15

## 2020-04-18 NOTE — Plan of Care (Signed)
  Problem: Education: Goal: Knowledge of General Education information will improve Description: Including pain rating scale, medication(s)/side effects and non-pharmacologic comfort measures Outcome: Progressing   Problem: Health Behavior/Discharge Planning: Goal: Ability to manage health-related needs will improve Outcome: Progressing   Problem: Clinical Measurements: Goal: Ability to maintain clinical measurements within normal limits will improve Outcome: Progressing Goal: Will remain free from infection Outcome: Progressing Goal: Diagnostic test results will improve Outcome: Progressing Goal: Respiratory complications will improve Outcome: Progressing Goal: Cardiovascular complication will be avoided Outcome: Progressing   Problem: Coping: Goal: Level of anxiety will decrease Outcome: Progressing   Problem: Elimination: Goal: Will not experience complications related to bowel motility Outcome: Progressing Goal: Will not experience complications related to urinary retention Outcome: Progressing   Problem: Pain Managment: Goal: General experience of comfort will improve Outcome: Progressing   Problem: Safety: Goal: Ability to remain free from injury will improve Outcome: Progressing
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
## 2020-04-18 NOTE — Progress Notes (Signed)
Discharged home thru [facility]. Discharge instructions sent thru them. Belongings taken home. Prescribed meds from pharmacy given to pt.

## 2020-04-19 ENCOUNTER — Other Ambulatory Visit: Payer: Self-pay

## 2020-04-19 ENCOUNTER — Other Ambulatory Visit: Payer: Self-pay | Admitting: *Deleted

## 2020-04-19 DIAGNOSIS — L8990 Pressure ulcer of unspecified site, unspecified stage: Secondary | ICD-10-CM | POA: Diagnosis not present

## 2020-04-19 DIAGNOSIS — I70229 Atherosclerosis of native arteries of extremities with rest pain, unspecified extremity: Secondary | ICD-10-CM | POA: Diagnosis not present

## 2020-04-19 DIAGNOSIS — M4647 Discitis, unspecified, lumbosacral region: Secondary | ICD-10-CM | POA: Diagnosis not present

## 2020-04-19 DIAGNOSIS — G8929 Other chronic pain: Secondary | ICD-10-CM | POA: Diagnosis not present

## 2020-04-19 DIAGNOSIS — S31000A Unspecified open wound of lower back and pelvis without penetration into retroperitoneum, initial encounter: Secondary | ICD-10-CM | POA: Diagnosis not present
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
## 2020-04-20 DIAGNOSIS — I951 Orthostatic hypotension: Secondary | ICD-10-CM | POA: Diagnosis not present

## 2020-04-20 DIAGNOSIS — Z7902 Long term (current) use of antithrombotics/antiplatelets: Secondary | ICD-10-CM | POA: Diagnosis not present

## 2020-04-20 DIAGNOSIS — J449 Chronic obstructive pulmonary disease, unspecified: Secondary | ICD-10-CM | POA: Diagnosis not present

## 2020-04-20 DIAGNOSIS — N3000 Acute cystitis without hematuria: Secondary | ICD-10-CM | POA: Diagnosis not present

## 2020-04-20 DIAGNOSIS — R339 Retention of urine, unspecified: Secondary | ICD-10-CM | POA: Diagnosis not present

## 2020-04-20 DIAGNOSIS — L89159 Pressure ulcer of sacral region, unspecified stage: Secondary | ICD-10-CM | POA: Diagnosis not present

## 2020-04-20 DIAGNOSIS — Z8673 Personal history of transient ischemic attack (TIA), and cerebral infarction without residual deficits: Secondary | ICD-10-CM | POA: Diagnosis not present

## 2020-04-20 DIAGNOSIS — E1169 Type 2 diabetes mellitus with other specified complication: Secondary | ICD-10-CM | POA: Diagnosis not present

## 2020-04-20 DIAGNOSIS — I110 Hypertensive heart disease with heart failure: Secondary | ICD-10-CM | POA: Diagnosis not present

## 2020-04-20 DIAGNOSIS — K922 Gastrointestinal hemorrhage, unspecified: Secondary | ICD-10-CM | POA: Diagnosis not present

## 2020-04-20 DIAGNOSIS — K5900 Constipation, unspecified: Secondary | ICD-10-CM | POA: Diagnosis not present

## 2020-04-20 DIAGNOSIS — I5030 Unspecified diastolic (congestive) heart failure: Secondary | ICD-10-CM | POA: Diagnosis not present

## 2020-04-20 DIAGNOSIS — Z7984 Long term (current) use of oral hypoglycemic drugs: Secondary | ICD-10-CM | POA: Diagnosis not present

## 2020-04-20 DIAGNOSIS — L89154 Pressure ulcer of sacral region, stage 4: Secondary | ICD-10-CM | POA: Diagnosis not present

## 2020-04-20 DIAGNOSIS — Z466 Encounter for fitting and adjustment of urinary device: Secondary | ICD-10-CM | POA: Diagnosis not present

## 2020-04-20 DIAGNOSIS — M4620 Osteomyelitis of vertebra, site unspecified: Secondary | ICD-10-CM | POA: Diagnosis not present

## 2020-04-22 ENCOUNTER — Telehealth: Payer: Self-pay

## 2020-04-22 DIAGNOSIS — N3000 Acute cystitis without hematuria: Secondary | ICD-10-CM | POA: Diagnosis not present

## 2020-04-22 DIAGNOSIS — Z8673 Personal history of transient ischemic attack (TIA), and cerebral infarction without residual deficits: Secondary | ICD-10-CM | POA: Diagnosis not present

## 2020-04-22 DIAGNOSIS — I951 Orthostatic hypotension: Secondary | ICD-10-CM | POA: Diagnosis not present

## 2020-04-22 DIAGNOSIS — Z7984 Long term (current) use of oral hypoglycemic drugs: Secondary | ICD-10-CM | POA: Diagnosis not present

## 2020-04-22 DIAGNOSIS — E1169 Type 2 diabetes mellitus with other specified complication: Secondary | ICD-10-CM | POA: Diagnosis not present

## 2020-04-22 DIAGNOSIS — R339 Retention of urine, unspecified: Secondary | ICD-10-CM | POA: Diagnosis not present

## 2020-04-22 DIAGNOSIS — L89154 Pressure ulcer of sacral region, stage 4: Secondary | ICD-10-CM | POA: Diagnosis not present

## 2020-04-22 DIAGNOSIS — Z466 Encounter for fitting and adjustment of urinary device: Secondary | ICD-10-CM | POA: Diagnosis not present

## 2020-04-22 DIAGNOSIS — I110 Hypertensive heart disease with heart failure: Secondary | ICD-10-CM | POA: Diagnosis not present

## 2020-04-22 DIAGNOSIS — K5900 Constipation, unspecified: Secondary | ICD-10-CM | POA: Diagnosis not present

## 2020-04-22 DIAGNOSIS — L89159 Pressure ulcer of sacral region, unspecified stage: Secondary | ICD-10-CM | POA: Diagnosis not present

## 2020-04-22 DIAGNOSIS — K922 Gastrointestinal hemorrhage, unspecified: Secondary | ICD-10-CM | POA: Diagnosis not present

## 2020-04-22 DIAGNOSIS — J449 Chronic obstructive pulmonary disease, unspecified: Secondary | ICD-10-CM | POA: Diagnosis not present

## 2020-04-22 DIAGNOSIS — Z7902 Long term (current) use of antithrombotics/antiplatelets: Secondary | ICD-10-CM | POA: Diagnosis not present

## 2020-04-22 DIAGNOSIS — I5030 Unspecified diastolic (congestive) heart failure: Secondary | ICD-10-CM | POA: Diagnosis not present

## 2020-04-22 DIAGNOSIS — M4620 Osteomyelitis of vertebra, site unspecified: Secondary | ICD-10-CM | POA: Diagnosis not present

## 2020-04-22 NOTE — Telephone Encounter (Signed)
Davene with [facility] requesting to speak with a nurse about hh orders. Please call back.

## 2020-04-22 NOTE — Telephone Encounter (Signed)
  [facility] follow up patient Call from [facility]-wanted to confirm physician Alfonse Spruce) listed will be signing off on [facility] Orders received when pt was discharged from hospital  Pt has [facility] appt on 03/24 with [facility] Orders were for Nursing and PT PT started 3/12 2x/week for 8wks  Pt with Stage IV decubitus ulcer (sacrum) HH to assist with normal saline Wet to Dry dressing 2x daily   CMA confirmed MD listed is an [facility] resident and either listed MD or colleague will sign paperwork.    No action required at this time-paperwork to follow

## 2020-04-22 NOTE — Telephone Encounter (Signed)
Thank you :)

## 2020-04-24 ENCOUNTER — Telehealth: Payer: Self-pay | Admitting: *Deleted

## 2020-04-24 DIAGNOSIS — I5030 Unspecified diastolic (congestive) heart failure: Secondary | ICD-10-CM | POA: Diagnosis not present

## 2020-04-24 DIAGNOSIS — J449 Chronic obstructive pulmonary disease, unspecified: Secondary | ICD-10-CM | POA: Diagnosis not present

## 2020-04-24 DIAGNOSIS — L89159 Pressure ulcer of sacral region, unspecified stage: Secondary | ICD-10-CM | POA: Diagnosis not present

## 2020-04-24 DIAGNOSIS — M4620 Osteomyelitis of vertebra, site unspecified: Secondary | ICD-10-CM | POA: Diagnosis not present

## 2020-04-24 DIAGNOSIS — N3000 Acute cystitis without hematuria: Secondary | ICD-10-CM | POA: Diagnosis not present

## 2020-04-24 DIAGNOSIS — Z8673 Personal history of transient ischemic attack (TIA), and cerebral infarction without residual deficits: Secondary | ICD-10-CM | POA: Diagnosis not present

## 2020-04-24 DIAGNOSIS — E1169 Type 2 diabetes mellitus with other specified complication: Secondary | ICD-10-CM | POA: Diagnosis not present

## 2020-04-24 DIAGNOSIS — K5900 Constipation, unspecified: Secondary | ICD-10-CM | POA: Diagnosis not present

## 2020-04-24 DIAGNOSIS — L89154 Pressure ulcer of sacral region, stage 4: Secondary | ICD-10-CM | POA: Diagnosis not present

## 2020-04-24 DIAGNOSIS — I951 Orthostatic hypotension: Secondary | ICD-10-CM | POA: Diagnosis not present

## 2020-04-24 DIAGNOSIS — K922 Gastrointestinal hemorrhage, unspecified: Secondary | ICD-10-CM | POA: Diagnosis not present

## 2020-04-24 DIAGNOSIS — Z7902 Long term (current) use of antithrombotics/antiplatelets: Secondary | ICD-10-CM | POA: Diagnosis not present

## 2020-04-24 DIAGNOSIS — R339 Retention of urine, unspecified: Secondary | ICD-10-CM | POA: Diagnosis not present

## 2020-04-24 DIAGNOSIS — Z466 Encounter for fitting and adjustment of urinary device: Secondary | ICD-10-CM | POA: Diagnosis not present

## 2020-04-24 DIAGNOSIS — I110 Hypertensive heart disease with heart failure: Secondary | ICD-10-CM | POA: Diagnosis not present

## 2020-04-24 DIAGNOSIS — Z7984 Long term (current) use of oral hypoglycemic drugs: Secondary | ICD-10-CM | POA: Diagnosis not present

## 2020-04-24 NOTE — Telephone Encounter (Signed)
Dwyane Luo, PT with [facility] called in requesting VO for [facility] PT 2 week 3 and 1 week 2 to work on sitting on edge of bed, safe transfers with hoyer lift, and proper w/c assessment. States he was d/c home with standard manual w/c and it won't work for patient; he will slide right out. Mickel Baas will work with Porfirio Mylar [place] once she assesses what will work best for patient.  Also, requesting SW Eval to help obtain more care hours. Verbal auth given. Will route to Yellow Team for agreement/denial.

## 2020-04-24 NOTE — Telephone Encounter (Signed)
Agree with VO

## 2020-04-25 DIAGNOSIS — I110 Hypertensive heart disease with heart failure: Secondary | ICD-10-CM | POA: Diagnosis not present

## 2020-04-25 DIAGNOSIS — Z7902 Long term (current) use of antithrombotics/antiplatelets: Secondary | ICD-10-CM | POA: Diagnosis not present

## 2020-04-25 DIAGNOSIS — Z8673 Personal history of transient ischemic attack (TIA), and cerebral infarction without residual deficits: Secondary | ICD-10-CM | POA: Diagnosis not present

## 2020-04-25 DIAGNOSIS — J449 Chronic obstructive pulmonary disease, unspecified: Secondary | ICD-10-CM | POA: Diagnosis not present

## 2020-04-25 DIAGNOSIS — L89159 Pressure ulcer of sacral region, unspecified stage: Secondary | ICD-10-CM | POA: Diagnosis not present

## 2020-04-25 DIAGNOSIS — K922 Gastrointestinal hemorrhage, unspecified: Secondary | ICD-10-CM | POA: Diagnosis not present

## 2020-04-25 DIAGNOSIS — I951 Orthostatic hypotension: Secondary | ICD-10-CM | POA: Diagnosis not present

## 2020-04-25 DIAGNOSIS — K5900 Constipation, unspecified: Secondary | ICD-10-CM | POA: Diagnosis not present

## 2020-04-25 DIAGNOSIS — N3000 Acute cystitis without hematuria: Secondary | ICD-10-CM | POA: Diagnosis not present

## 2020-04-25 DIAGNOSIS — Z466 Encounter for fitting and adjustment of urinary device: Secondary | ICD-10-CM | POA: Diagnosis not present

## 2020-04-25 DIAGNOSIS — E1169 Type 2 diabetes mellitus with other specified complication: Secondary | ICD-10-CM | POA: Diagnosis not present

## 2020-04-25 DIAGNOSIS — Z7984 Long term (current) use of oral hypoglycemic drugs: Secondary | ICD-10-CM | POA: Diagnosis not present

## 2020-04-25 DIAGNOSIS — I5030 Unspecified diastolic (congestive) heart failure: Secondary | ICD-10-CM | POA: Diagnosis not present

## 2020-04-25 DIAGNOSIS — L89154 Pressure ulcer of sacral region, stage 4: Secondary | ICD-10-CM | POA: Diagnosis not present

## 2020-04-25 DIAGNOSIS — R339 Retention of urine, unspecified: Secondary | ICD-10-CM | POA: Diagnosis not present

## 2020-04-25 DIAGNOSIS — M4620 Osteomyelitis of vertebra, site unspecified: Secondary | ICD-10-CM | POA: Diagnosis not present

## 2020-04-26 DIAGNOSIS — Z7902 Long term (current) use of antithrombotics/antiplatelets: Secondary | ICD-10-CM | POA: Diagnosis not present

## 2020-04-26 DIAGNOSIS — R339 Retention of urine, unspecified: Secondary | ICD-10-CM | POA: Diagnosis not present

## 2020-04-26 DIAGNOSIS — Z7984 Long term (current) use of oral hypoglycemic drugs: Secondary | ICD-10-CM | POA: Diagnosis not present

## 2020-04-26 DIAGNOSIS — L89159 Pressure ulcer of sacral region, unspecified stage: Secondary | ICD-10-CM | POA: Diagnosis not present

## 2020-04-26 DIAGNOSIS — K5900 Constipation, unspecified: Secondary | ICD-10-CM | POA: Diagnosis not present

## 2020-04-26 DIAGNOSIS — K922 Gastrointestinal hemorrhage, unspecified: Secondary | ICD-10-CM | POA: Diagnosis not present

## 2020-04-26 DIAGNOSIS — I951 Orthostatic hypotension: Secondary | ICD-10-CM | POA: Diagnosis not present

## 2020-04-26 DIAGNOSIS — Z466 Encounter for fitting and adjustment of urinary device: Secondary | ICD-10-CM | POA: Diagnosis not present

## 2020-04-26 DIAGNOSIS — J449 Chronic obstructive pulmonary disease, unspecified: Secondary | ICD-10-CM | POA: Diagnosis not present

## 2020-04-26 DIAGNOSIS — N3000 Acute cystitis without hematuria: Secondary | ICD-10-CM | POA: Diagnosis not present

## 2020-04-26 DIAGNOSIS — Z8673 Personal history of transient ischemic attack (TIA), and cerebral infarction without residual deficits: Secondary | ICD-10-CM | POA: Diagnosis not present

## 2020-04-26 DIAGNOSIS — E1169 Type 2 diabetes mellitus with other specified complication: Secondary | ICD-10-CM | POA: Diagnosis not present

## 2020-04-26 DIAGNOSIS — L89154 Pressure ulcer of sacral region, stage 4: Secondary | ICD-10-CM | POA: Diagnosis not present

## 2020-04-26 DIAGNOSIS — I5030 Unspecified diastolic (congestive) heart failure: Secondary | ICD-10-CM | POA: Diagnosis not present

## 2020-04-26 DIAGNOSIS — M4620 Osteomyelitis of vertebra, site unspecified: Secondary | ICD-10-CM | POA: Diagnosis not present

## 2020-04-26 DIAGNOSIS — I110 Hypertensive heart disease with heart failure: Secondary | ICD-10-CM | POA: Diagnosis not present

## 2020-04-29 DIAGNOSIS — E1169 Type 2 diabetes mellitus with other specified complication: Secondary | ICD-10-CM | POA: Diagnosis not present

## 2020-04-29 DIAGNOSIS — L89159 Pressure ulcer of sacral region, unspecified stage: Secondary | ICD-10-CM | POA: Diagnosis not present

## 2020-04-29 DIAGNOSIS — M4620 Osteomyelitis of vertebra, site unspecified: Secondary | ICD-10-CM | POA: Diagnosis not present

## 2020-04-29 DIAGNOSIS — R339 Retention of urine, unspecified: Secondary | ICD-10-CM | POA: Diagnosis not present

## 2020-04-29 DIAGNOSIS — L89154 Pressure ulcer of sacral region, stage 4: Secondary | ICD-10-CM | POA: Diagnosis not present

## 2020-04-29 DIAGNOSIS — Z466 Encounter for fitting and adjustment of urinary device: Secondary | ICD-10-CM | POA: Diagnosis not present

## 2020-04-29 DIAGNOSIS — I951 Orthostatic hypotension: Secondary | ICD-10-CM | POA: Diagnosis not present

## 2020-04-29 DIAGNOSIS — N3000 Acute cystitis without hematuria: Secondary | ICD-10-CM | POA: Diagnosis not present

## 2020-04-29 DIAGNOSIS — Z8673 Personal history of transient ischemic attack (TIA), and cerebral infarction without residual deficits: Secondary | ICD-10-CM | POA: Diagnosis not present

## 2020-04-29 DIAGNOSIS — Z7984 Long term (current) use of oral hypoglycemic drugs: Secondary | ICD-10-CM | POA: Diagnosis not present

## 2020-04-29 DIAGNOSIS — I110 Hypertensive heart disease with heart failure: Secondary | ICD-10-CM | POA: Diagnosis not present

## 2020-04-29 DIAGNOSIS — K922 Gastrointestinal hemorrhage, unspecified: Secondary | ICD-10-CM | POA: Diagnosis not present

## 2020-04-29 DIAGNOSIS — Z7902 Long term (current) use of antithrombotics/antiplatelets: Secondary | ICD-10-CM | POA: Diagnosis not present

## 2020-04-29 DIAGNOSIS — I5030 Unspecified diastolic (congestive) heart failure: Secondary | ICD-10-CM | POA: Diagnosis not present

## 2020-04-29 DIAGNOSIS — J449 Chronic obstructive pulmonary disease, unspecified: Secondary | ICD-10-CM | POA: Diagnosis not present

## 2020-04-29 DIAGNOSIS — K5900 Constipation, unspecified: Secondary | ICD-10-CM | POA: Diagnosis not present

## 2020-04-30 DIAGNOSIS — N3000 Acute cystitis without hematuria: Secondary | ICD-10-CM | POA: Diagnosis not present

## 2020-04-30 DIAGNOSIS — Z7984 Long term (current) use of oral hypoglycemic drugs: Secondary | ICD-10-CM | POA: Diagnosis not present

## 2020-04-30 DIAGNOSIS — K922 Gastrointestinal hemorrhage, unspecified: Secondary | ICD-10-CM | POA: Diagnosis not present

## 2020-04-30 DIAGNOSIS — I5030 Unspecified diastolic (congestive) heart failure: Secondary | ICD-10-CM | POA: Diagnosis not present

## 2020-04-30 DIAGNOSIS — L89154 Pressure ulcer of sacral region, stage 4: Secondary | ICD-10-CM | POA: Diagnosis not present

## 2020-04-30 DIAGNOSIS — K5900 Constipation, unspecified: Secondary | ICD-10-CM | POA: Diagnosis not present

## 2020-04-30 DIAGNOSIS — J449 Chronic obstructive pulmonary disease, unspecified: Secondary | ICD-10-CM | POA: Diagnosis not present

## 2020-04-30 DIAGNOSIS — Z466 Encounter for fitting and adjustment of urinary device: Secondary | ICD-10-CM | POA: Diagnosis not present

## 2020-04-30 DIAGNOSIS — Z8673 Personal history of transient ischemic attack (TIA), and cerebral infarction without residual deficits: Secondary | ICD-10-CM | POA: Diagnosis not present

## 2020-04-30 DIAGNOSIS — L89159 Pressure ulcer of sacral region, unspecified stage: Secondary | ICD-10-CM | POA: Diagnosis not present

## 2020-04-30 DIAGNOSIS — Z7902 Long term (current) use of antithrombotics/antiplatelets: Secondary | ICD-10-CM | POA: Diagnosis not present

## 2020-04-30 DIAGNOSIS — I110 Hypertensive heart disease with heart failure: Secondary | ICD-10-CM | POA: Diagnosis not present

## 2020-04-30 DIAGNOSIS — I951 Orthostatic hypotension: Secondary | ICD-10-CM | POA: Diagnosis not present

## 2020-04-30 DIAGNOSIS — M4620 Osteomyelitis of vertebra, site unspecified: Secondary | ICD-10-CM | POA: Diagnosis not present

## 2020-04-30 DIAGNOSIS — R339 Retention of urine, unspecified: Secondary | ICD-10-CM | POA: Diagnosis not present

## 2020-04-30 DIAGNOSIS — E1169 Type 2 diabetes mellitus with other specified complication: Secondary | ICD-10-CM | POA: Diagnosis not present

## 2020-05-02 ENCOUNTER — Other Ambulatory Visit: Payer: Self-pay

## 2020-05-02 ENCOUNTER — Ambulatory Visit (INDEPENDENT_AMBULATORY_CARE_PROVIDER_SITE_OTHER): Payer: Medicare Other | Admitting: Student

## 2020-05-02 ENCOUNTER — Encounter: Payer: Self-pay | Admitting: Student

## 2020-05-02 VITALS — BP 189/99 | HR 109 | Temp 98.0°F

## 2020-05-02 DIAGNOSIS — R531 Weakness: Secondary | ICD-10-CM | POA: Diagnosis not present

## 2020-05-02 DIAGNOSIS — M2550 Pain in unspecified joint: Secondary | ICD-10-CM | POA: Diagnosis not present

## 2020-05-02 DIAGNOSIS — I69354 Hemiplegia and hemiparesis following cerebral infarction affecting left non-dominant side: Secondary | ICD-10-CM

## 2020-05-02 DIAGNOSIS — I10 Essential (primary) hypertension: Secondary | ICD-10-CM

## 2020-05-02 DIAGNOSIS — D500 Iron deficiency anemia secondary to blood loss (chronic): Secondary | ICD-10-CM | POA: Diagnosis not present

## 2020-05-02 DIAGNOSIS — R339 Retention of urine, unspecified: Secondary | ICD-10-CM | POA: Diagnosis not present

## 2020-05-02 DIAGNOSIS — Z743 Need for continuous supervision: Secondary | ICD-10-CM | POA: Diagnosis not present

## 2020-05-02 DIAGNOSIS — I5032 Chronic diastolic (congestive) heart failure: Secondary | ICD-10-CM

## 2020-05-02 DIAGNOSIS — E7800 Pure hypercholesterolemia, unspecified: Secondary | ICD-10-CM | POA: Diagnosis not present

## 2020-05-02 DIAGNOSIS — Z7401 Bed confinement status: Secondary | ICD-10-CM | POA: Diagnosis not present

## 2020-05-02 DIAGNOSIS — K5900 Constipation, unspecified: Secondary | ICD-10-CM

## 2020-05-02 DIAGNOSIS — R5381 Other malaise: Secondary | ICD-10-CM | POA: Diagnosis not present

## 2020-05-02 DIAGNOSIS — I639 Cerebral infarction, unspecified: Secondary | ICD-10-CM | POA: Diagnosis not present

## 2020-05-02 DIAGNOSIS — R195 Other fecal abnormalities: Secondary | ICD-10-CM

## 2020-05-02 DIAGNOSIS — R6889 Other general symptoms and signs: Secondary | ICD-10-CM | POA: Diagnosis not present

## 2020-05-02 DIAGNOSIS — F0280 Dementia in other diseases classified elsewhere without behavioral disturbance: Secondary | ICD-10-CM

## 2020-05-02 DIAGNOSIS — M8660 Other chronic osteomyelitis, unspecified site: Secondary | ICD-10-CM

## 2020-05-02 MED ORDER — ASPIRIN EC 81 MG PO TBEC: 81.0000 mg | DELAYED_RELEASE_TABLET | Freq: Every day | ORAL | 2 refills | Status: AC

## 2020-05-02 MED ORDER — MEMANTINE HCL 10 MG PO TABS: 10.0000 mg | ORAL_TABLET | Freq: Two times a day (BID) | ORAL | 3 refills | Status: AC

## 2020-05-02 MED ORDER — ATORVASTATIN CALCIUM 80 MG PO TABS: 80.0000 mg | ORAL_TABLET | Freq: Every day | ORAL | 3 refills | Status: DC

## 2020-05-02 MED ORDER — OXYCODONE HCL 5 MG PO TABS: 5.0000 mg | ORAL_TABLET | ORAL | 0 refills | Status: DC | PRN

## 2020-05-02 NOTE — Assessment & Plan Note (Signed)
Patient has E. coli UTI with urinary retention in this hospitalization.  Initial in and out cath collected 1.2 L of urine.  A Foley was placed for concern of bladder stretch injury.  Patient was treated with 10 days of Keflex, which he finished.  -Advised [name] to call the urology office to make a follow-up appointment

## 2020-05-02 NOTE — Assessment & Plan Note (Signed)
Resume memantine

## 2020-05-02 NOTE — Patient Instructions (Addendum)
Mr. Bray,  It is a pleasure seeing you in the clinic today.  Here is a summary of what we talked about:  1.  Sacral wound: The wound does not appear infected today.  Please continue changing his bandage frequently.  I prescribed 30 more tablets of the oxycodone for his pain.   Please call and make an appointment with the wound center [place]             . Schedule an appointment as soon as possible for a visit in 1 week(s).   Why: Please call to schedule wound care follow up.  Contact information: [address] [place] [address] [phone]  2.  GI bleed: I will check his blood count today.  I will also refer him to a gastroenterologist for his bleeding.  I will stop Plavix and start aspirin 81 mg to reduce his bleeding risk.  3.  Urinary retention: Please call and make appointment with a urologist   [place]. Schedule an appointment as soon as possible for a visit in 2 week(s).   Why: Please schedule a follow up appointment with [facility] in about 2 weeks for follow up. Please keep your foley in place until your appointment. Contact information: [place] [phone]   4.  Stroke: Please stop Plavix and start aspirin 81 mg.  I will resume his atorvastatin 80 mg.  I sent a new prescription to his pharmacy

## 2020-05-02 NOTE — Progress Notes (Signed)
CC: Hospital follow-up.  Establishing care  HPI:  [name] is a 70 y.o. with past medical history of CVA with left hemiparesis, bedbound, COPD on 2 L, iron deficiency is anemia, who presented to the clinic for a hospital follow up.   Patient was hospitalized on 04/22/2020 for melena, abdominal pain, diarrhea, hypotension. CT abdomen/pelvis showed possible duodenitis vs PUD vs mesenteritis. GI was consulted but did not recommended inpatient work up due to stable Hgb. He was also found to have chronic osteomyelitis of his sacral wound on CT. Surgery was consulted but did not recommend debridement.  No antibiotic was used because the wound looks uninfected.  He also had urinary retention and E. coli UTI that was treated with 10 days of Keflex.  Foley was placed for concern of bladder stretch injury.  Patient arrived to the clinic via stretcher.  He is accompanied with his healthcare power of attorney, [name], also his friend.  She states that patient used to stay at [facility] and she thinks that he did not receive adequate care there, which worsens his sacral wound.  Patient currently living with her and she is his caretaker.  They also have home health but she thinks she will need additional help to turn patient every 2 hours.  [name] states that patient is bedbound due to his left hemiparesis from his past CVA.  He has used a wheelchair before but does not have a right size wheelchair at the moment.  Reports that PT who came into the house did not think the patient can operate a wheelchair on his own.  Patient states that he is doing okay but complains of pain on his backside, rated 10/10, described as aching and stabbing, states the pain is constant.  He denies any chest pain, shortness of breath, abdominal pain.  [name] reports 2 episodes of melena since discharge and denies any blood in stool.  She has not contacted wound care center or urology for follow-up  appointment  Social history -currently lives with his healthcare power of attorney -Bedbound -Total dependence on ADLs  Family History  Problem Relation Age of Onset  . Heart attack Mother   . Heart failure Mother   . Cirrhosis Father   . Heart failure Brother   . Cancer Brother   . Hypertension Neg Hx        UNKNOWN  . Stroke Neg Hx        UNKNOWN    Past Medical History:  Diagnosis Date  . Acute blood loss anemia   . Acute CVA (cerebrovascular accident) ([place]) 08/27/2014  . Acute encephalopathy 11/28/2017  . Acute ischemic stroke ([place])   . AKI (acute kidney injury) ([place])   . Alcohol abuse    H/O  . Alcohol dependence with withdrawal with complication ([place]) 0/86/5784  . Anxiety   . Arterial ischemic stroke, MCA (middle cerebral artery), right, acute ([place])   . Arthritis   . Asthma   . Binocular vision disorder with diplopia 01/03/2013  . CAD (coronary artery disease) 05/27/10   Cath: severe single vessell CAD left cx midportion obtuse marginal 2 to 3.  . Cerebral infarction due to embolism of right middle cerebral artery ([place])   . Cerebral infarction due to stenosis of right carotid artery ([place]) 11/01/2014  . Cerebral thrombosis with cerebral infarction 07/18/2016  . Cerebrovascular accident (CVA) due to stenosis of right carotid artery ([place])   . CHF (congestive heart failure) ([place])   .  Closed blow-out fracture of floor of orbit ([place]) 01/19/2013  . COPD (chronic obstructive pulmonary disease) ([place])   . COPD (chronic obstructive pulmonary disease) ([place])   . CVA (cerebral vascular accident) ([place]) 08/26/2014  . Dementia due to another medical condition ([place]) 07/12/2015  . Depression   . Diabetes mellitus, type 2 ([place]) 03/13/2015  . Diastolic CHF, acute on chronic (HCC) 11/01/2014  . DOE (dyspnea on exertion) 11/23/2014  . DVT (deep venous thrombosis) ([place]) 02/22/2014  . Embolic stroke ([place])   . Enophthalmos due to trauma 01/19/2013  . Eyelid lesion 01/24/2013  . Fracture of  inferior orbital wall ([place]) 01/03/2013  . Fracture of orbital floor ([place]) 01/03/2013  . GERD (gastroesophageal reflux disease)   . H/O total knee replacement 10/24/2014  . HCAP (healthcare-associated pneumonia) 10/15/2015  . Headache around the eyes 08/27/2014  . History of DVT (deep vein thrombosis)   . History of DVT of lower extremity   . Hyperkalemia 10/24/2014  . Hyperlipemia   . Hyperlipidemia   . Hypertension   . Hypoalbuminemia due to protein-calorie malnutrition ([employer])   . Hypotension   . Labile blood pressure   . Left shoulder pain 09/28/2017  . Lower extremity edema 02/21/2014  . Medial orbital wall fracture (HCC) 01/19/2013  . Myocardial infarction ([place]) 2000  . Orbital fracture ([place]) 12/2012  . OSA on CPAP   . Pain of left eye 01/03/2013  . Peripheral vascular disease, unspecified ([place])    08/20/10 doppler: increase in right ABI post-op. Left ABI stable. S/P bi-fem bypass surgery  . Pneumonia 04/04/2012  . Right middle cerebral artery stroke ([place]) 01/18/2018  . Seizures ([place])   . Shortness of breath   . Stroke (cerebrum) ([place]) 02/06/2015  . Thrombocytopenia ([place])   . Tobacco abuse   . Trichiasis without entropion 04/16/2013   Overview:  LLL central   . Type 2 diabetes mellitus with peripheral neuropathy ([place]) 03/13/2015  . Urinary incontinence 03/14/2015   Review of Systems:   Review of Systems  Respiratory: Negative for shortness of breath.   Cardiovascular: Positive for leg swelling. Negative for chest pain.  Gastrointestinal: Positive for melena. Negative for abdominal pain and blood in stool.  Genitourinary: Negative for hematuria.  Musculoskeletal: Positive for back pain.    Physical Exam:  Vitals:   05/02/20 1353  BP: (!) 189/99  Pulse: (!) 109  Temp: 98 F (36.7 C)  TempSrc: Oral  SpO2: 91%   Physical Exam Constitutional:      Appearance: He is ill-appearing.  HENT:     Head: Normocephalic.  Eyes:     General:        Right eye: No discharge.         Left eye: No discharge.  Cardiovascular:     Rate and Rhythm: Normal rate and regular rhythm.  Pulmonary:     Effort: Pulmonary effort is normal. No respiratory distress.  Abdominal:     General: Bowel sounds are normal.     Palpations: Abdomen is soft.     Tenderness: There is no abdominal tenderness.  Musculoskeletal:     Cervical back: Normal range of motion.     Right lower leg: No edema.     Left lower leg: No edema.     Comments: Left arm contracted.  Normal range of motion of right arm.  Skin:    General: Skin is warm.  Neurological:     Mental Status: He is alert.  Psychiatric:  Mood and Affect: Mood normal.        Assessment & Plan:   See Encounters Tab for problem based charting.  Patient discussed with Dr. Evette Doffing

## 2020-05-02 NOTE — Assessment & Plan Note (Addendum)
Patient have his chronic osteomyelitis of sacral wound found on CT.  Surgery was consulted but did not recommend surgical debridement.  No antibiotic was given.  His wound appears noninfected today.  His caregiver states that she is trying to change his position every 2 hours, but it is challenging to do it on her own.  They do with her home health to come by and she requests extra hour for personal aides.  Patient reports pain of his backside, rated 10/10, described as aching and stabbing, states the pain is constant.  -Advised [name] to call wound care center to make an a follow-up appointment -Continue changing his bandage frequently -Prescribed 30 more tablets of oxycodone for his pain -Referral to CCM

## 2020-05-02 NOTE — Assessment & Plan Note (Signed)
Patient was on losartan in the past but held in this hospitalization due to hypotension.  Blood pressure checked in the clinic was 189/90.  His caretaker states that this is likely due to his backside pain pain from transportation and moving.  She states that home health RN does check his blood pressure and systolic usually in the 681 and diastolic in the 59E.  Blood pressure checked this morning was 137/81.  Given his normal blood pressure at home and history of hypotension.  We will continue holding losartan

## 2020-05-02 NOTE — Assessment & Plan Note (Addendum)
Patient have history of CVA and left hemiparesis.  Is currently bedbound.  He has used a wheelchair in the past but does not have a right sized wheelchair at home.  His power of attorney states that PT evaluation did not think that he can operate a wheelchair.  Patient is totally dependent for ADLs.  -Switch Plavix to aspirin 81 mg to reduce risk of bleeding -Restart atorvastatin 80 mg -Patient currently has home health.  -Patient will benefit from chronic care management.  Referral placed -Patient can have telehealth visit for follow-up given difficulty of transportation

## 2020-05-02 NOTE — Assessment & Plan Note (Addendum)
Patient was hospitalized for abdominal pain, melena, diarrhea and hypotension.  FOBT was positive.  GI was consulted but did not recommend any inpatient work-up given stable hemoglobin.  Patient reports 2 episodes of melena after discharge.  Denies any blood in stool.  Assessment and plan Given his ongoing melena, will recheck CBC.  Also check iron study and ferritin.  Patient was on ferrous sulfate in the past.  Also refer to GI for diagnostic work-up, however patient may not be candidate for invasive procedure given his multiple comorbidities.  -Will switch Plavix to 81 mg aspirin to reduce his bleeding risk. -Pending CBC, ferritin, iron study -Continue pantoprazole -Referral to GI  Addendum I called and informed his POA of the lab result.    CBC shows hemoglobin of 13.4, jumped up from 9.3 in 2 weeks.  Suspect that he is dehydrated given all cell lines are elevated.  Patient was started back on Lasix 40 mg after discharge. Iron study consistent with iron deficiency anemia with low iron, low iron sat and low normal ferritin.  His IDA likely from his chronic wound vs GI bleed.  Patient was on ferrous sulfate in the past .  Will start Nu-Iron 150 mg every other day for lesser side effects. CMP shows stable creatinine.  Sodium slightly elevated 147 and calcium at 10.4, suspect dehydration.  -Start Nu-Iron 150 mg every other day. -GI referral for evaluation of GI bleed -F/u with wound care center for sacral wound  -Encourage appropriate hydration -Will repeat CBC, BMP, reticulocyte count, iron study and ferritin in 2 -3 weeks. His HH RN will collect blood at home.

## 2020-05-02 NOTE — Assessment & Plan Note (Signed)
Patient appears euvolemic on exam.    -Continue Lasix 40 mg -Check BMP today

## 2020-05-03 ENCOUNTER — Telehealth: Payer: Self-pay | Admitting: *Deleted

## 2020-05-03 DIAGNOSIS — E1169 Type 2 diabetes mellitus with other specified complication: Secondary | ICD-10-CM | POA: Diagnosis not present

## 2020-05-03 DIAGNOSIS — K5900 Constipation, unspecified: Secondary | ICD-10-CM | POA: Diagnosis not present

## 2020-05-03 DIAGNOSIS — I951 Orthostatic hypotension: Secondary | ICD-10-CM | POA: Diagnosis not present

## 2020-05-03 DIAGNOSIS — L89154 Pressure ulcer of sacral region, stage 4: Secondary | ICD-10-CM | POA: Diagnosis not present

## 2020-05-03 DIAGNOSIS — K922 Gastrointestinal hemorrhage, unspecified: Secondary | ICD-10-CM | POA: Diagnosis not present

## 2020-05-03 DIAGNOSIS — Z8673 Personal history of transient ischemic attack (TIA), and cerebral infarction without residual deficits: Secondary | ICD-10-CM | POA: Diagnosis not present

## 2020-05-03 DIAGNOSIS — Z7984 Long term (current) use of oral hypoglycemic drugs: Secondary | ICD-10-CM | POA: Diagnosis not present

## 2020-05-03 DIAGNOSIS — J449 Chronic obstructive pulmonary disease, unspecified: Secondary | ICD-10-CM | POA: Diagnosis not present

## 2020-05-03 DIAGNOSIS — Z466 Encounter for fitting and adjustment of urinary device: Secondary | ICD-10-CM | POA: Diagnosis not present

## 2020-05-03 DIAGNOSIS — N3000 Acute cystitis without hematuria: Secondary | ICD-10-CM | POA: Diagnosis not present

## 2020-05-03 DIAGNOSIS — I110 Hypertensive heart disease with heart failure: Secondary | ICD-10-CM | POA: Diagnosis not present

## 2020-05-03 DIAGNOSIS — L89159 Pressure ulcer of sacral region, unspecified stage: Secondary | ICD-10-CM | POA: Diagnosis not present

## 2020-05-03 DIAGNOSIS — M4620 Osteomyelitis of vertebra, site unspecified: Secondary | ICD-10-CM | POA: Diagnosis not present

## 2020-05-03 DIAGNOSIS — R339 Retention of urine, unspecified: Secondary | ICD-10-CM | POA: Diagnosis not present

## 2020-05-03 DIAGNOSIS — I5030 Unspecified diastolic (congestive) heart failure: Secondary | ICD-10-CM | POA: Diagnosis not present

## 2020-05-03 DIAGNOSIS — Z7902 Long term (current) use of antithrombotics/antiplatelets: Secondary | ICD-10-CM | POA: Diagnosis not present

## 2020-05-03 LAB — BMP8+ANION GAP
Anion Gap: 18 mmol/L (ref 10.0–18.0)
BUN/Creatinine Ratio: 16 (ref 10–24)
BUN: 8 mg/dL (ref 8–27)
CO2: 25 mmol/L (ref 20–29)
Calcium: 10.4 mg/dL — ABNORMAL HIGH (ref 8.6–10.2)
Chloride: 104 mmol/L (ref 96–106)
Creatinine, Ser: 0.49 mg/dL — ABNORMAL LOW (ref 0.76–1.27)
Glucose: 114 mg/dL — ABNORMAL HIGH (ref 65–99)
Potassium: 4.5 mmol/L (ref 3.5–5.2)
Sodium: 147 mmol/L — ABNORMAL HIGH (ref 134–144)
eGFR: 110 mL/min/{1.73_m2} (ref >59)

## 2020-05-03 LAB — IRON AND TIBC
Iron Saturation: 9 % — CL (ref 15–55)
Iron: 27 ug/dL — ABNORMAL LOW (ref 38–169)
Total Iron Binding Capacity: 287 ug/dL (ref 250–450)
UIBC: 260 ug/dL (ref 111–343)

## 2020-05-03 LAB — CBC
Hematocrit: 41.3 % (ref 37.5–51.0)
Hemoglobin: 13.4 g/dL (ref 13.0–17.7)
MCH: 27.6 pg (ref 26.6–33.0)
MCHC: 32.4 g/dL (ref 31.5–35.7)
MCV: 85 fL (ref 79–97)
Platelets: 495 10*3/uL — ABNORMAL HIGH (ref 150–450)
RBC: 4.85 x10E6/uL (ref 4.14–5.80)
RDW: 14.4 % (ref 11.6–15.4)
WBC: 6.8 10*3/uL (ref 3.4–10.8)

## 2020-05-03 LAB — FERRITIN: Ferritin: 70 ng/mL (ref 30–400)

## 2020-05-03 MED ORDER — POLYSACCHARIDE IRON COMPLEX 150 MG PO CAPS: 150.0000 mg | ORAL_CAPSULE | ORAL | 1 refills | Status: DC

## 2020-05-03 NOTE — Telephone Encounter (Signed)
Left detailed message on [name]'s self-identified VM that PCS form was faxed to [facility] with confirmation receipt received.

## 2020-05-03 NOTE — Telephone Encounter (Signed)
Completed and signed Request for Independent Assessment for [place] of Medical Need faxed to [place] at [phone].

## 2020-05-03 NOTE — Addendum Note (Signed)
Addended by: Lalla Brothers T on: 05/03/2020 10:32 AM   Modules accepted: Level of Service

## 2020-05-03 NOTE — Telephone Encounter (Signed)
[place], SW with [facility], called in regarding PCS. States she will fax forms to [place]. Explained we have already completed forms and they will be faxed today to [employer]. She was very Patent attorney.

## 2020-05-03 NOTE — Progress Notes (Signed)
Internal Medicine Clinic Attending  Case discussed with Dr. Nguyen  At the time of the visit.  We reviewed the resident's history and exam and pertinent patient test results.  I agree with the assessment, diagnosis, and plan of care documented in the resident's note. 

## 2020-05-03 NOTE — Addendum Note (Signed)
Addended byGaylan Gerold on: 05/03/2020 10:28 AM   Modules accepted: Orders

## 2020-05-06 ENCOUNTER — Telehealth: Payer: Self-pay | Admitting: *Deleted

## 2020-05-06 NOTE — Telephone Encounter (Signed)
   Telephone encounter was:  Successful.  05/06/2020 Name: [name] MRN: [id number] DOB: November 17, 1950  [name] is a 70 y.o. year old male who is a primary care patient of Garwin Brothers, MD . The community resource team was consulted for assistance with Food Insecurity, Caregiver Stress and medical supplies  Care guide performed the following interventions: Patient provided with information about care guide support team and interviewed to confirm resource needs Obtained verbal consent to place patient referral to meals on wheels Follow up call placed to community resources to determine status of patients referral.  Follow Up Plan:  Care guide will follow up with patient by phone over the next wed  [place], Care Management  [phone] [address] , [place] [address] Email : [email or web address] [email or web address]

## 2020-05-07 ENCOUNTER — Other Ambulatory Visit: Payer: Self-pay | Admitting: *Deleted

## 2020-05-07 ENCOUNTER — Telehealth: Payer: Self-pay | Admitting: *Deleted

## 2020-05-07 DIAGNOSIS — M4620 Osteomyelitis of vertebra, site unspecified: Secondary | ICD-10-CM | POA: Diagnosis not present

## 2020-05-07 DIAGNOSIS — Z7984 Long term (current) use of oral hypoglycemic drugs: Secondary | ICD-10-CM | POA: Diagnosis not present

## 2020-05-07 DIAGNOSIS — E1169 Type 2 diabetes mellitus with other specified complication: Secondary | ICD-10-CM | POA: Diagnosis not present

## 2020-05-07 DIAGNOSIS — I110 Hypertensive heart disease with heart failure: Secondary | ICD-10-CM | POA: Diagnosis not present

## 2020-05-07 DIAGNOSIS — J449 Chronic obstructive pulmonary disease, unspecified: Secondary | ICD-10-CM | POA: Diagnosis not present

## 2020-05-07 DIAGNOSIS — N3000 Acute cystitis without hematuria: Secondary | ICD-10-CM | POA: Diagnosis not present

## 2020-05-07 DIAGNOSIS — K922 Gastrointestinal hemorrhage, unspecified: Secondary | ICD-10-CM | POA: Diagnosis not present

## 2020-05-07 DIAGNOSIS — L89154 Pressure ulcer of sacral region, stage 4: Secondary | ICD-10-CM | POA: Diagnosis not present

## 2020-05-07 DIAGNOSIS — I951 Orthostatic hypotension: Secondary | ICD-10-CM | POA: Diagnosis not present

## 2020-05-07 DIAGNOSIS — K5900 Constipation, unspecified: Secondary | ICD-10-CM | POA: Diagnosis not present

## 2020-05-07 DIAGNOSIS — L89159 Pressure ulcer of sacral region, unspecified stage: Secondary | ICD-10-CM | POA: Diagnosis not present

## 2020-05-07 DIAGNOSIS — R339 Retention of urine, unspecified: Secondary | ICD-10-CM | POA: Diagnosis not present

## 2020-05-07 DIAGNOSIS — I5030 Unspecified diastolic (congestive) heart failure: Secondary | ICD-10-CM | POA: Diagnosis not present

## 2020-05-07 DIAGNOSIS — Z8673 Personal history of transient ischemic attack (TIA), and cerebral infarction without residual deficits: Secondary | ICD-10-CM | POA: Diagnosis not present

## 2020-05-07 DIAGNOSIS — Z466 Encounter for fitting and adjustment of urinary device: Secondary | ICD-10-CM | POA: Diagnosis not present

## 2020-05-07 DIAGNOSIS — Z7902 Long term (current) use of antithrombotics/antiplatelets: Secondary | ICD-10-CM | POA: Diagnosis not present

## 2020-05-07 NOTE — Chronic Care Management (AMB) (Signed)
  Care Management   Note  05/07/2020 Name: [name] MRN: [id number] DOB: 22-Mar-1950  [name] is a 70 y.o. year old male who is a primary care patient of Gaylan Gerold, DO. I reached out to [name] by phone today in response to a referral sent by [name]'s PCP ,Gaylan Gerold, DO   [name] was given information about care management services today including:  1. Care management services include personalized support from designated clinical staff supervised by his physician, including individualized plan of care and coordination with other care providers 2. 24/7 contact phone numbers for assistance for urgent and routine care needs. 3. The patient may stop care management services at any time by phone call to the office staff.  [name] dpr on file  verbally agreed to assistance and services provided by embedded care coordination/care management team today.  Follow up plan: Telephone appointment with care management team member scheduled for:05/21/2020  [place] Management

## 2020-05-07 NOTE — Patient Outreach (Signed)
[facility]) Care Management  05/07/2020  [name] 12-28-1950 [id number]   Patient is being transitioned to Chronic Care Management with the primary provider office, case closed.  [name], [place], MSN [place] [phone]

## 2020-05-07 NOTE — Addendum Note (Signed)
Addended byGaylan Gerold on: 05/07/2020 01:09 PM   Modules accepted: Orders

## 2020-05-07 NOTE — Telephone Encounter (Signed)
   Telephone encounter was:  Successful.  05/07/2020 Name: [name] MRN: [id number] DOB: 1950/08/05  [name] is a 70 y.o. year old male who is a primary care patient of Garwin Brothers, MD . The community resource team was consulted for assistance with Food Insecurity, Caregiver Stress and supplies for wound care   Care guide performed the following interventions: Patient provided with information about care guide support team and interviewed to confirm resource needs.  Follow Up Plan:  Care guide will follow up with patient by phone over the next few days  [place], Care Management  [phone] [address] , [place] [address] Email : [email or web address] [email or web address]

## 2020-05-09 ENCOUNTER — Telehealth: Payer: Self-pay

## 2020-05-09 DIAGNOSIS — L89159 Pressure ulcer of sacral region, unspecified stage: Secondary | ICD-10-CM

## 2020-05-09 DIAGNOSIS — M8660 Other chronic osteomyelitis, unspecified site: Secondary | ICD-10-CM

## 2020-05-09 MED ORDER — ACETAMINOPHEN 500 MG PO TABS: 1000.0000 mg | ORAL_TABLET | Freq: Three times a day (TID) | ORAL | 0 refills | Status: AC

## 2020-05-09 MED ORDER — OXYCODONE HCL 5 MG PO TABS: 5.0000 mg | ORAL_TABLET | Freq: Four times a day (QID) | ORAL | 0 refills | Status: AC | PRN

## 2020-05-09 NOTE — Telephone Encounter (Signed)
RTC, spoke with [name] [facility]), who states the RX for Oxycodone which was sent on 05/02/20 was only a 7 day supply.  She is upset and states "That ain't right, the Dr told us he would give him some pain medicine for the next 30 days until he see's the wound center.  The Dr. Asked that we set up appt's with the wound center, urologist, and GI and I have scheduled appointments with all three.  The wound center appointment is on 06/03/20 and it's just not fair that he only got a weeks worth of pain medicine and now what, he's supposed to lay here hurting"?  Will forward to Dr. Alfonse Spruce.  As of note, EC was very pleasant during conversation, just upset. SChaplin, RN,BSN

## 2020-05-09 NOTE — Telephone Encounter (Addendum)
I called and spoke to patient healthcare agent, [name].  She states that patient is doing okay but the pain from his wound is still bothering him.  She and home health RN have been changing the bandage and states that the wound appears clean and noninfected.  She states that she has been giving him 4-5 tablets of oxycodone daily which only provide minimal relief.  States that she only had 2 tablets left.  His appointment with wound care is not until 06/03/2020.   Will start scheduled Tylenol 1000mg  every 8 hours to keep his pain level now.  Josefina Do to only give oxycodone as needed for breakthrough pain.  The goal is trying to transition him off of oxycodone.  [name] verbalizes understanding.  -Tylenol 1 g every 8 hours -Oxycodone 5 mg every 6 hours as needed for breakthrough pain.  Will send in 30 more tablets, hopefully will last untill his wound care appointment

## 2020-05-09 NOTE — Telephone Encounter (Signed)
Pls contact pt regarding pain medicine [phone]

## 2020-05-13 ENCOUNTER — Telehealth: Payer: Self-pay

## 2020-05-13 ENCOUNTER — Telehealth: Payer: Self-pay | Admitting: *Deleted

## 2020-05-13 DIAGNOSIS — I110 Hypertensive heart disease with heart failure: Secondary | ICD-10-CM | POA: Diagnosis not present

## 2020-05-13 DIAGNOSIS — L89154 Pressure ulcer of sacral region, stage 4: Secondary | ICD-10-CM | POA: Diagnosis not present

## 2020-05-13 DIAGNOSIS — K922 Gastrointestinal hemorrhage, unspecified: Secondary | ICD-10-CM | POA: Diagnosis not present

## 2020-05-13 DIAGNOSIS — E1169 Type 2 diabetes mellitus with other specified complication: Secondary | ICD-10-CM | POA: Diagnosis not present

## 2020-05-13 DIAGNOSIS — L89159 Pressure ulcer of sacral region, unspecified stage: Secondary | ICD-10-CM | POA: Diagnosis not present

## 2020-05-13 DIAGNOSIS — M4620 Osteomyelitis of vertebra, site unspecified: Secondary | ICD-10-CM | POA: Diagnosis not present

## 2020-05-13 DIAGNOSIS — I951 Orthostatic hypotension: Secondary | ICD-10-CM | POA: Diagnosis not present

## 2020-05-13 DIAGNOSIS — K5900 Constipation, unspecified: Secondary | ICD-10-CM | POA: Diagnosis not present

## 2020-05-13 DIAGNOSIS — I5030 Unspecified diastolic (congestive) heart failure: Secondary | ICD-10-CM | POA: Diagnosis not present

## 2020-05-13 DIAGNOSIS — N3000 Acute cystitis without hematuria: Secondary | ICD-10-CM | POA: Diagnosis not present

## 2020-05-13 DIAGNOSIS — Z7984 Long term (current) use of oral hypoglycemic drugs: Secondary | ICD-10-CM | POA: Diagnosis not present

## 2020-05-13 DIAGNOSIS — R339 Retention of urine, unspecified: Secondary | ICD-10-CM | POA: Diagnosis not present

## 2020-05-13 DIAGNOSIS — J449 Chronic obstructive pulmonary disease, unspecified: Secondary | ICD-10-CM | POA: Diagnosis not present

## 2020-05-13 DIAGNOSIS — Z8673 Personal history of transient ischemic attack (TIA), and cerebral infarction without residual deficits: Secondary | ICD-10-CM | POA: Diagnosis not present

## 2020-05-13 DIAGNOSIS — Z466 Encounter for fitting and adjustment of urinary device: Secondary | ICD-10-CM | POA: Diagnosis not present

## 2020-05-13 DIAGNOSIS — Z7902 Long term (current) use of antithrombotics/antiplatelets: Secondary | ICD-10-CM | POA: Diagnosis not present

## 2020-05-13 NOTE — Telephone Encounter (Signed)
[name] pt's caregiver requesting to speak with a nurse about PCS. Please call pt back.

## 2020-05-13 NOTE — Telephone Encounter (Signed)
Patient's caregiver called back. States patient started receiving Meals on Wheels on 4/1 and 4/2 patient had 3 "mushy BMs", 4/3 had 4 mushy BMs and has had 2 already this AM. States patient is also passing a lot of gas. She gave him 2 imodium tabs yesterday and 2 today. She is afraid to give him anything to eat today as " it will come right out." Explained importance of good nutrition for proper wound healing. Also, as patient is bed bound, reassured that having BMs and passing gas is a good sign that his bowels are functioning properly. She is running out of incontinence supplies. As patient has Medicaid, we can order supplies through [employer] at no cost. She is very Patent attorney. Will place order in Yellow Team's box.  Also, she noticed a small amount of blood in patient's urinary catheter this morning. Patient denies pain. He has his first appt with Urology on 06/11/2020 (first available). [place] RN will be coming to home this afternoon to check on it. [name] feels [place] is not coming out frequetly enough (2 days per week). Advised her to discuss her concerns with [facility] RN this afternoon. Explained that [place] is short term and there may not be any way to get them out more frequently. Patient also has PCS in place.

## 2020-05-13 NOTE — Telephone Encounter (Signed)
   Telephone encounter was:  Successful.  05/13/2020 Name: DRE GAMINO MRN: [id number] DOB: [birth year]  [name] is a 70 y.o. year old male who is a primary care patient of Gaylan Gerold, DO . The community resource team was consulted for assistance with Food Insecurity, Home Modifications, Caregiver Stress and Financial Difficulties related to nursing care   Care guide performed the following interventions: Patient provided with information about care guide support team and interviewed to confirm resource needs.  Follow Up Plan:  No further follow up planned at this time. The patient has been provided with needed resources.  [place], Care Management  [phone] [address] , [place] [address] Email : Ashby Dawes. [email or web address]

## 2020-05-14 NOTE — Telephone Encounter (Signed)
Completed and signed order form for incontinence supplies, along with OV notes from 05/02/2020, and demographics faxed to [employer] at [phone]. Fax confirmation receipt received.

## 2020-05-14 NOTE — Telephone Encounter (Signed)
SPOKE WITH [name] / REQUESTING CHANGE OF STATUS FORM / PATIENT WAS GIVEN THE EXTRA HELP FOR 45 MINUTES A DAY.

## 2020-05-15 ENCOUNTER — Telehealth: Payer: Self-pay | Admitting: *Deleted

## 2020-05-15 NOTE — Telephone Encounter (Signed)
NT received call from patient's [facility] stating that patient's PCS aide is only I home 45 minutes per day. Spoke with Stevphen Meuse at [employer] who states patient should be receiving 110 hours per month with a minimum of 3.5 hours per day. States EC will need to contact the [facility] Provider [facility])

## 2020-05-16 DIAGNOSIS — E1169 Type 2 diabetes mellitus with other specified complication: Secondary | ICD-10-CM | POA: Diagnosis not present

## 2020-05-16 DIAGNOSIS — Z7902 Long term (current) use of antithrombotics/antiplatelets: Secondary | ICD-10-CM | POA: Diagnosis not present

## 2020-05-16 DIAGNOSIS — Z7984 Long term (current) use of oral hypoglycemic drugs: Secondary | ICD-10-CM | POA: Diagnosis not present

## 2020-05-16 DIAGNOSIS — I110 Hypertensive heart disease with heart failure: Secondary | ICD-10-CM | POA: Diagnosis not present

## 2020-05-16 DIAGNOSIS — J449 Chronic obstructive pulmonary disease, unspecified: Secondary | ICD-10-CM | POA: Diagnosis not present

## 2020-05-16 DIAGNOSIS — Z8673 Personal history of transient ischemic attack (TIA), and cerebral infarction without residual deficits: Secondary | ICD-10-CM | POA: Diagnosis not present

## 2020-05-16 DIAGNOSIS — K922 Gastrointestinal hemorrhage, unspecified: Secondary | ICD-10-CM | POA: Diagnosis not present

## 2020-05-16 DIAGNOSIS — N3000 Acute cystitis without hematuria: Secondary | ICD-10-CM | POA: Diagnosis not present

## 2020-05-16 DIAGNOSIS — I5030 Unspecified diastolic (congestive) heart failure: Secondary | ICD-10-CM | POA: Diagnosis not present

## 2020-05-16 DIAGNOSIS — R339 Retention of urine, unspecified: Secondary | ICD-10-CM | POA: Diagnosis not present

## 2020-05-16 DIAGNOSIS — M4620 Osteomyelitis of vertebra, site unspecified: Secondary | ICD-10-CM | POA: Diagnosis not present

## 2020-05-16 DIAGNOSIS — K5900 Constipation, unspecified: Secondary | ICD-10-CM | POA: Diagnosis not present

## 2020-05-16 DIAGNOSIS — Z466 Encounter for fitting and adjustment of urinary device: Secondary | ICD-10-CM | POA: Diagnosis not present

## 2020-05-16 DIAGNOSIS — I951 Orthostatic hypotension: Secondary | ICD-10-CM | POA: Diagnosis not present

## 2020-05-16 DIAGNOSIS — L89159 Pressure ulcer of sacral region, unspecified stage: Secondary | ICD-10-CM | POA: Diagnosis not present

## 2020-05-16 DIAGNOSIS — L89154 Pressure ulcer of sacral region, stage 4: Secondary | ICD-10-CM | POA: Diagnosis not present

## 2020-05-16 NOTE — Telephone Encounter (Signed)
Spoke with EC yesterday regarding PCS Aide. States she comes for 2 hours and 45 min in AM and someone else comes for 45 min in afternoon.  Today EC states she would like to try for a different [place] [facility] as the nurses don't come out when they want them to but only when they are in the area. Tried explaining how [place] works. She would still like to try a different agency. States patient has had blood in catheter today. She contacted [facility] and they are supposed to come out this afternoon.  Discussed situation with Dr. Dareen Piano and referral made to [facility]. CM sent to [facility] for referral.

## 2020-05-16 NOTE — Telephone Encounter (Signed)
Colletta [place], RN with [facility] called from patient's home. States there is sediment and a little bit of blood in collection bag. She is requesting a VO to irrigate. Discussed with Dr. Truman Hayward and VO given.  Also made EC aware that a referral has been placed for [facility] through [facility]. She is very Patent attorney.

## 2020-05-16 NOTE — Telephone Encounter (Signed)
Dorian Pod, RN; Dudley Major, MD; P Imp Yellow  Hi [name],   We will be happy to go evaluate him for our program. I will let you know what day we get him scheduled for   Thanks,   Cecille Rubin

## 2020-05-17 ENCOUNTER — Telehealth: Payer: Self-pay | Admitting: Internal Medicine

## 2020-05-17 NOTE — Telephone Encounter (Signed)
[facility]  Velora Heckler, RN  We have him scheduled for Monday at 2pm

## 2020-05-17 NOTE — Telephone Encounter (Signed)
Agree with VO for irrigation of catheter

## 2020-05-18 DIAGNOSIS — S31000A Unspecified open wound of lower back and pelvis without penetration into retroperitoneum, initial encounter: Secondary | ICD-10-CM | POA: Diagnosis not present

## 2020-05-18 DIAGNOSIS — I70229 Atherosclerosis of native arteries of extremities with rest pain, unspecified extremity: Secondary | ICD-10-CM | POA: Diagnosis not present

## 2020-05-18 DIAGNOSIS — L89303 Pressure ulcer of unspecified buttock, stage 3: Secondary | ICD-10-CM | POA: Diagnosis not present

## 2020-05-18 DIAGNOSIS — G8929 Other chronic pain: Secondary | ICD-10-CM | POA: Diagnosis not present

## 2020-05-18 DIAGNOSIS — M4647 Discitis, unspecified, lumbosacral region: Secondary | ICD-10-CM | POA: Diagnosis not present

## 2020-05-19 NOTE — Telephone Encounter (Signed)
Contacted by patient's caretaker [name] regarding sacral wound concerns.  She states that he began having slightly more increased drainage that was green in color, in addition to being malodorous.  She states that she is continuing to dress his wounds as instructed by wound care.  Patient is otherwise stable with no fevers, chills; eating and drinking as normal.  She is wondering if she should take the patient to the ED for evaluation.  I reviewed patient's history of sacral ulcer with images available on the media tab.  Recent image from 05/02/2020 with evidence of surrounding green granulation tissue. I am concerned about it becoming malodorous.  I recommended that patient call the clinic first thing Monday morning and make an appointment for ASAP.  Counseled that should patient develop any fever, chills, altered mental status, erythema around the ulcer, increasing drainage with worsening odor, he should be taken to the ED or urgent care right away for evaluation.  [name] expressed understanding.  All questions and concerns addressed.  Dr. Jose Persia Internal Medicine PGY-2  Pager: [phone] After 5pm on weekdays and 1pm on weekends: On Call pager [phone]  05/19/2020, 4:42 PM

## 2020-05-20 ENCOUNTER — Telehealth: Payer: Self-pay

## 2020-05-20 ENCOUNTER — Encounter: Payer: Self-pay | Admitting: *Deleted

## 2020-05-20 ENCOUNTER — Other Ambulatory Visit (HOSPITAL_COMMUNITY): Payer: Self-pay

## 2020-05-20 DIAGNOSIS — L89154 Pressure ulcer of sacral region, stage 4: Secondary | ICD-10-CM | POA: Diagnosis not present

## 2020-05-20 DIAGNOSIS — Z7902 Long term (current) use of antithrombotics/antiplatelets: Secondary | ICD-10-CM | POA: Diagnosis not present

## 2020-05-20 DIAGNOSIS — E1169 Type 2 diabetes mellitus with other specified complication: Secondary | ICD-10-CM | POA: Diagnosis not present

## 2020-05-20 DIAGNOSIS — Z466 Encounter for fitting and adjustment of urinary device: Secondary | ICD-10-CM | POA: Diagnosis not present

## 2020-05-20 DIAGNOSIS — Z8673 Personal history of transient ischemic attack (TIA), and cerebral infarction without residual deficits: Secondary | ICD-10-CM | POA: Diagnosis not present

## 2020-05-20 DIAGNOSIS — I110 Hypertensive heart disease with heart failure: Secondary | ICD-10-CM | POA: Diagnosis not present

## 2020-05-20 DIAGNOSIS — L89159 Pressure ulcer of sacral region, unspecified stage: Secondary | ICD-10-CM | POA: Diagnosis not present

## 2020-05-20 DIAGNOSIS — K922 Gastrointestinal hemorrhage, unspecified: Secondary | ICD-10-CM | POA: Diagnosis not present

## 2020-05-20 DIAGNOSIS — I5030 Unspecified diastolic (congestive) heart failure: Secondary | ICD-10-CM | POA: Diagnosis not present

## 2020-05-20 DIAGNOSIS — I951 Orthostatic hypotension: Secondary | ICD-10-CM | POA: Diagnosis not present

## 2020-05-20 DIAGNOSIS — J449 Chronic obstructive pulmonary disease, unspecified: Secondary | ICD-10-CM | POA: Diagnosis not present

## 2020-05-20 DIAGNOSIS — K5900 Constipation, unspecified: Secondary | ICD-10-CM | POA: Diagnosis not present

## 2020-05-20 DIAGNOSIS — R339 Retention of urine, unspecified: Secondary | ICD-10-CM | POA: Diagnosis not present

## 2020-05-20 DIAGNOSIS — Z7984 Long term (current) use of oral hypoglycemic drugs: Secondary | ICD-10-CM | POA: Diagnosis not present

## 2020-05-20 DIAGNOSIS — M4620 Osteomyelitis of vertebra, site unspecified: Secondary | ICD-10-CM | POA: Diagnosis not present

## 2020-05-20 DIAGNOSIS — N3000 Acute cystitis without hematuria: Secondary | ICD-10-CM | POA: Diagnosis not present

## 2020-05-20 NOTE — Telephone Encounter (Signed)
Pt  Care taker is requesting a call back   She stated that the traveling nurse is coming out to the home is requesting a order be placed for a UA  For pt    ( the nurse is going to be at the home between 10 -11)

## 2020-05-20 NOTE — Telephone Encounter (Signed)
UA Dr. Ernesto Rutherford  Order given to Maudie Mercury, nurse with Remus Loffler, RN,BSN

## 2020-05-20 NOTE — Telephone Encounter (Signed)
Dr. Truman Hayward, Please see note from 05/15/20 which states there was blood and sediment noted in the cath bag.  Are you ok with them getting a UA today? Thanks [employer]

## 2020-05-20 NOTE — Telephone Encounter (Signed)
TC to the caregiver, [name], she was informed that Dr. Truman Hayward will give VO for UA.  [name] states [place] nurse is not there yet.  RN informed [name] when [facility] nurse arrives, to call back and ask front desk to patch call through.  As of note, there is a phone note from Dr. Charleen Kirks dated 4/10 that she spoke to caregiver, [name], regarding drainage w/ odor from patient's wounds and caregiver was instructed to call the clinic Monday morning for an appt.  RN asked caregiver about this and caregiver got upset and states they did not talk about bringing pt to clinic.  She states pt is bedridden and she is not bringing him to clinic.  Caregiver states she is doing the best job she can right now and states if pt develops any fevers, chills, confusion, she will have him brought to the ED.  Caregiver expresses her appreciation for [facility]. SChaplin, RN,BSN

## 2020-05-20 NOTE — Telephone Encounter (Signed)
Sure, agree with UA

## 2020-05-21 ENCOUNTER — Ambulatory Visit: Payer: Medicare Other | Admitting: *Deleted

## 2020-05-21 DIAGNOSIS — I639 Cerebral infarction, unspecified: Secondary | ICD-10-CM

## 2020-05-21 DIAGNOSIS — L89159 Pressure ulcer of sacral region, unspecified stage: Secondary | ICD-10-CM

## 2020-05-21 DIAGNOSIS — F0280 Dementia in other diseases classified elsewhere without behavioral disturbance: Secondary | ICD-10-CM

## 2020-05-21 DIAGNOSIS — E7800 Pure hypercholesterolemia, unspecified: Secondary | ICD-10-CM

## 2020-05-21 DIAGNOSIS — I5032 Chronic diastolic (congestive) heart failure: Secondary | ICD-10-CM

## 2020-05-21 NOTE — Telephone Encounter (Signed)
SPOKE WITH [name] (ALSO [name]) REGARDING PCS. TO GET UNDERSTANDING OF THE 45 MIN GIVEN FOR PCS. [name] GOT CLARIFICATION OF THE PCS TIME.  [name] STATED TO JUST LEAVE THINGS AS IS FOR NOW.

## 2020-05-21 NOTE — Chronic Care Management (AMB) (Signed)
   05/21/2020  [name] Jan 24, 1951 [id number]   Spoke with patient's POA [name] via her contact number to complete scheduled CCM initial intake. Ms [name] says patient has "all the services he needs right now" as he is Active with [facility] who saw patient for first time yesterday in their primary care home based program, [name] [employer] for [employer] and Ms. [name] is his full time caregiver. Ms [name] says she  is interested in speaking with a social worker to discuss eventual long term placement so follow up arranged for 05/27/20 at 4:00 pm.  Kelli Churn RN, CCM, [facility] RN Care Manager [phone]

## 2020-05-22 ENCOUNTER — Telehealth: Payer: Self-pay | Admitting: *Deleted

## 2020-05-22 DIAGNOSIS — R339 Retention of urine, unspecified: Secondary | ICD-10-CM

## 2020-05-22 MED ORDER — SULFAMETHOXAZOLE-TRIMETHOPRIM 800-160 MG PO TABS: 1.0000 | ORAL_TABLET | Freq: Two times a day (BID) | ORAL | 0 refills | Status: AC

## 2020-05-22 NOTE — Telephone Encounter (Signed)
Reviewed UA result with [name] as well as his clinical picture. She mentions that he has been complaining of worsening dysuria and he has noticed increased 'green-colored' drainage around the catheter site. Discussed his prior culture result and need to start antibiotics for complicated UTI. [name] expressed understanding and agrees with the plan. Also mentions home nurse will be visiting tomorrow. Also mentions that [name] has upcoming appointment with urology.  - Bactrim 800-160mg  BID for 7 day course

## 2020-05-22 NOTE — Telephone Encounter (Signed)
HCPOA, [name] called in stating there is blood in the drainage bag of patient's foley. States there is urine leaking around the tubing and is causing patient burning pain. States there is also "slimy drainage" around the catheter tubing. denies fever. She has called the [facility] Nurse to come out to look at it. She is requesting results from UA done on 05/20/2020.  Call placed to [facility] at [facility] office [phone]). She will fax UA results to [facility] today. Also, Leisa Lenz states she spoke with [name] earlier and is trying to find a RN to go to home to check on patient/foley. She told [name] she would call her if she wasn't able to find someone. She is still waiting on responses.

## 2020-05-22 NOTE — Telephone Encounter (Signed)
Thank you for the update. We can follow up the UA once it is faxed to [place].

## 2020-05-22 NOTE — Telephone Encounter (Signed)
Requesting to speak with [name], please call pt back.

## 2020-05-22 NOTE — Telephone Encounter (Signed)
Received faxed UA results and handed to Dr. Truman Hayward. He will contact [name].

## 2020-05-23 ENCOUNTER — Telehealth: Payer: Self-pay

## 2020-05-23 ENCOUNTER — Telehealth: Payer: Self-pay | Admitting: *Deleted

## 2020-05-23 DIAGNOSIS — E1169 Type 2 diabetes mellitus with other specified complication: Secondary | ICD-10-CM | POA: Diagnosis not present

## 2020-05-23 DIAGNOSIS — I951 Orthostatic hypotension: Secondary | ICD-10-CM | POA: Diagnosis not present

## 2020-05-23 DIAGNOSIS — K922 Gastrointestinal hemorrhage, unspecified: Secondary | ICD-10-CM | POA: Diagnosis not present

## 2020-05-23 DIAGNOSIS — Z8673 Personal history of transient ischemic attack (TIA), and cerebral infarction without residual deficits: Secondary | ICD-10-CM | POA: Diagnosis not present

## 2020-05-23 DIAGNOSIS — Z7984 Long term (current) use of oral hypoglycemic drugs: Secondary | ICD-10-CM | POA: Diagnosis not present

## 2020-05-23 DIAGNOSIS — N3000 Acute cystitis without hematuria: Secondary | ICD-10-CM | POA: Diagnosis not present

## 2020-05-23 DIAGNOSIS — Z466 Encounter for fitting and adjustment of urinary device: Secondary | ICD-10-CM | POA: Diagnosis not present

## 2020-05-23 DIAGNOSIS — Z7902 Long term (current) use of antithrombotics/antiplatelets: Secondary | ICD-10-CM | POA: Diagnosis not present

## 2020-05-23 DIAGNOSIS — K5900 Constipation, unspecified: Secondary | ICD-10-CM | POA: Diagnosis not present

## 2020-05-23 DIAGNOSIS — R339 Retention of urine, unspecified: Secondary | ICD-10-CM | POA: Diagnosis not present

## 2020-05-23 DIAGNOSIS — L89159 Pressure ulcer of sacral region, unspecified stage: Secondary | ICD-10-CM | POA: Diagnosis not present

## 2020-05-23 DIAGNOSIS — J449 Chronic obstructive pulmonary disease, unspecified: Secondary | ICD-10-CM | POA: Diagnosis not present

## 2020-05-23 DIAGNOSIS — L89154 Pressure ulcer of sacral region, stage 4: Secondary | ICD-10-CM | POA: Diagnosis not present

## 2020-05-23 DIAGNOSIS — I5030 Unspecified diastolic (congestive) heart failure: Secondary | ICD-10-CM | POA: Diagnosis not present

## 2020-05-23 DIAGNOSIS — M4620 Osteomyelitis of vertebra, site unspecified: Secondary | ICD-10-CM | POA: Diagnosis not present

## 2020-05-23 DIAGNOSIS — I110 Hypertensive heart disease with heart failure: Secondary | ICD-10-CM | POA: Diagnosis not present

## 2020-05-23 NOTE — Telephone Encounter (Signed)
Pls contact [phone] Kennyth Lose w/ [employer]

## 2020-05-23 NOTE — Telephone Encounter (Signed)
Lourena Simmonds, Speech Therapist with [employer], called asking to move [facility] to next week as she will be out of town till then. VO given. Will route to Yellow Team for agreement/denial.

## 2020-05-23 NOTE — Telephone Encounter (Signed)
I agree

## 2020-05-23 NOTE — Telephone Encounter (Signed)
Signed order, and CMN for urinary incontinence supplies faxed to [facility] at [employer] [phone]. Fax confirmation receipt received.

## 2020-05-25 IMAGING — CT CT HEAD CODE STROKE W/O CM
3 series · 15 of 47 positions shown, 18 images · non-contrast
Comparison: CT head 01/12/2018

CLINICAL DATA: Code stroke. Altered level of consciousness.
Right-sided deficit. Slurred speech

EXAM:
CT HEAD WITHOUT CONTRAST
TECHNIQUE: Contiguous axial images were obtained from the base of the skull
through the vertex without intravenous contrast.

[Series 3: head 5.0 st · axial · 0.43mm/px · z∈[-134,+20]mm · 9 of 37 slices shown, 12 images]
[im 3/37  brain]
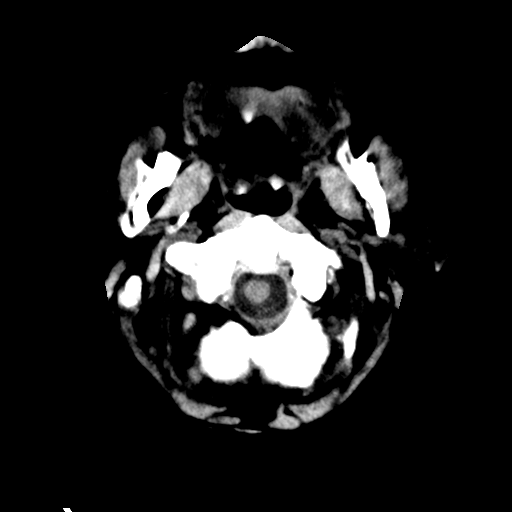
[im 3/37  bone]
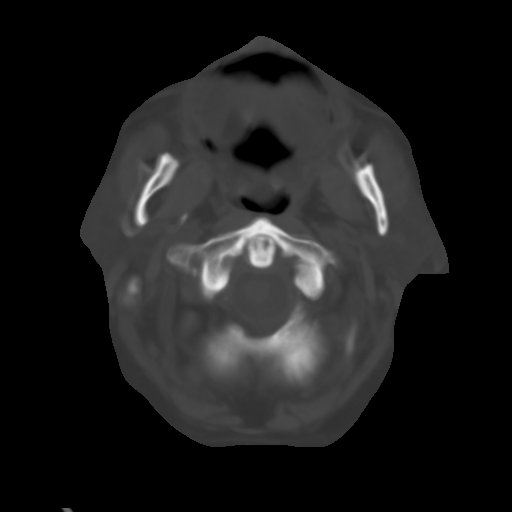
[im 7/37  brain]
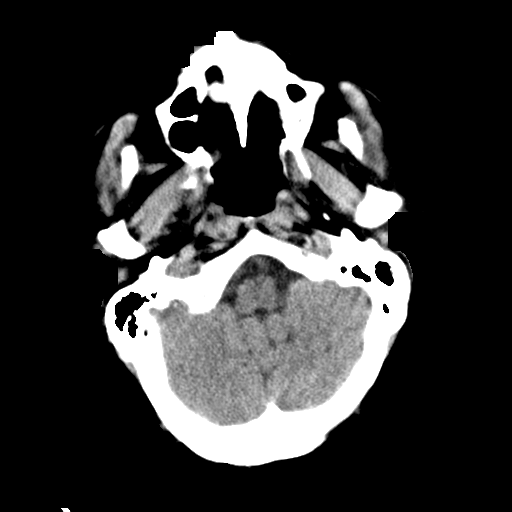
[im 10/37  brain]
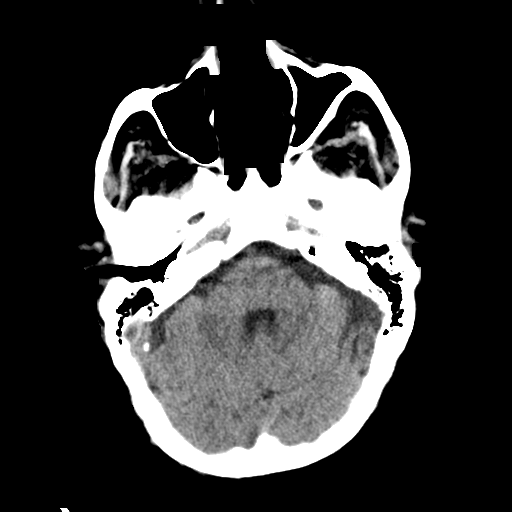
[im 14/37  brain]
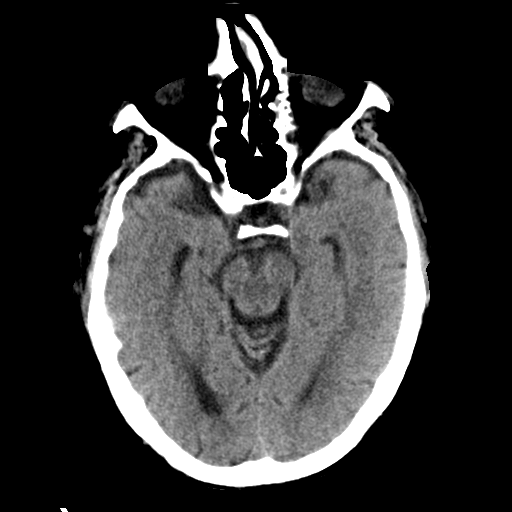
[im 19/37  brain]
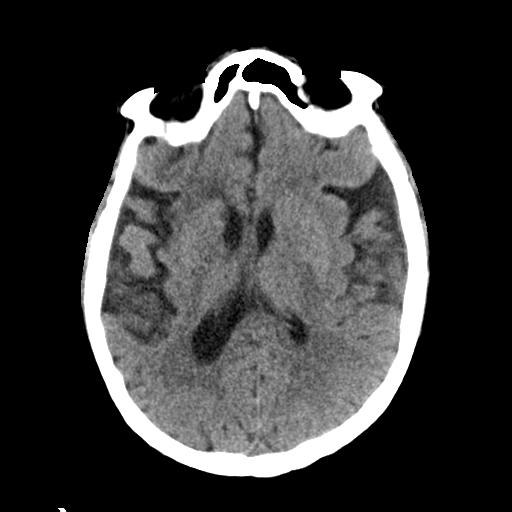
[im 19/37  bone]
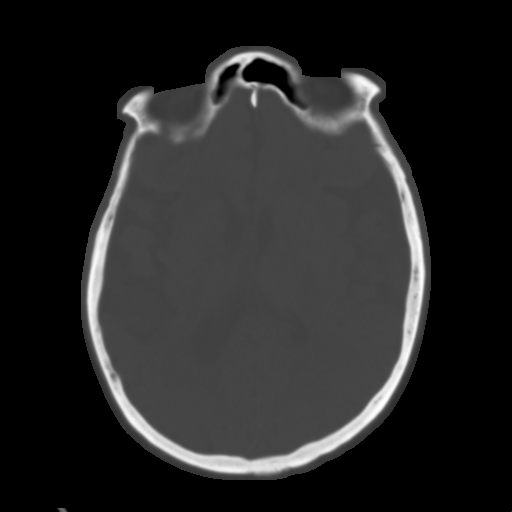
[im 23/37  brain]
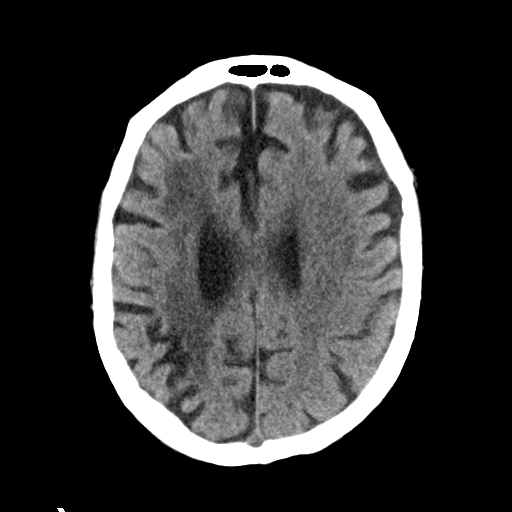
[im 27/37  brain]
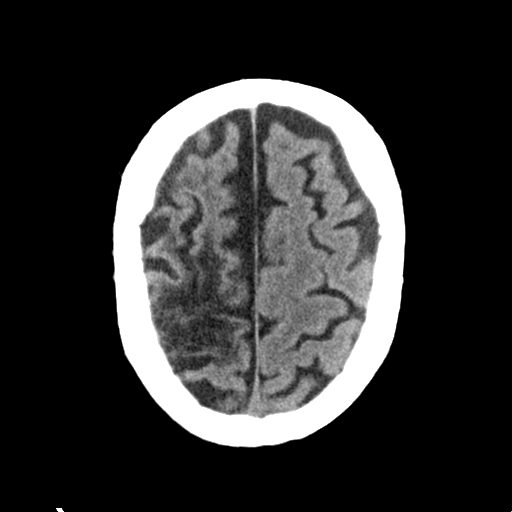
[im 30/37  brain]
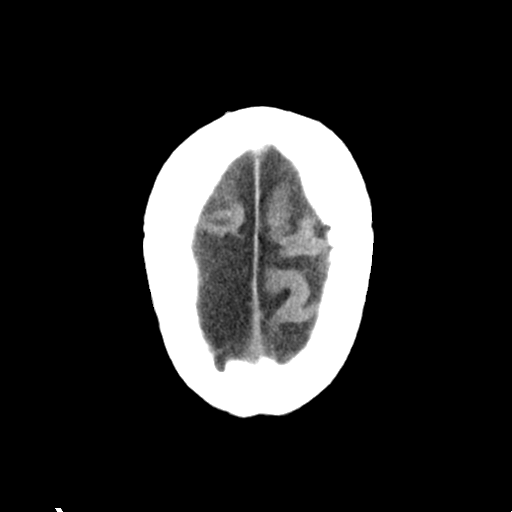
[im 34/37  brain]
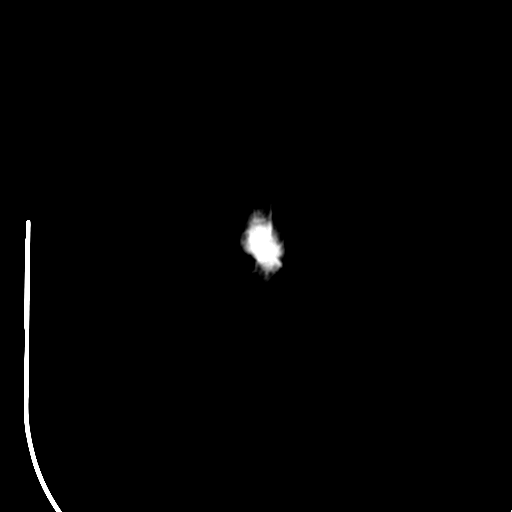
[im 34/37  bone]
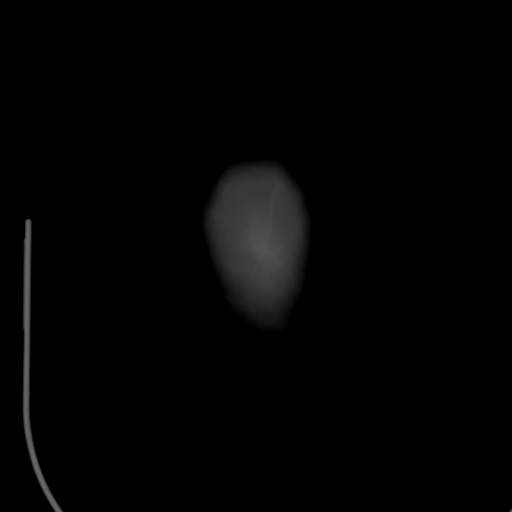

[Series 5: head 3.0 cor st · coronal · 0.36mm/px · 3 of 72 slices shown]
[im 24/72  brain]
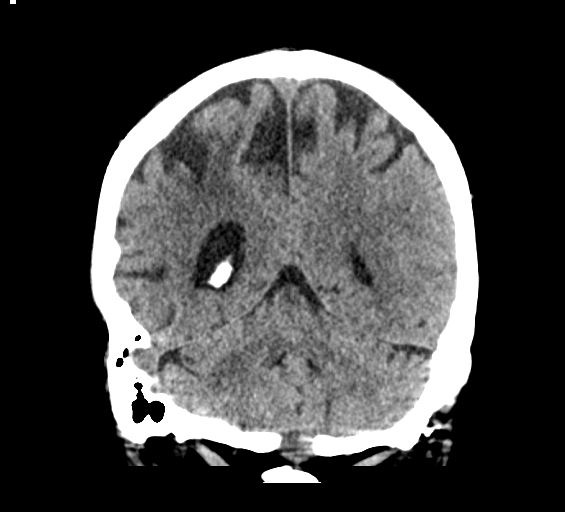
[im 32/72  brain]
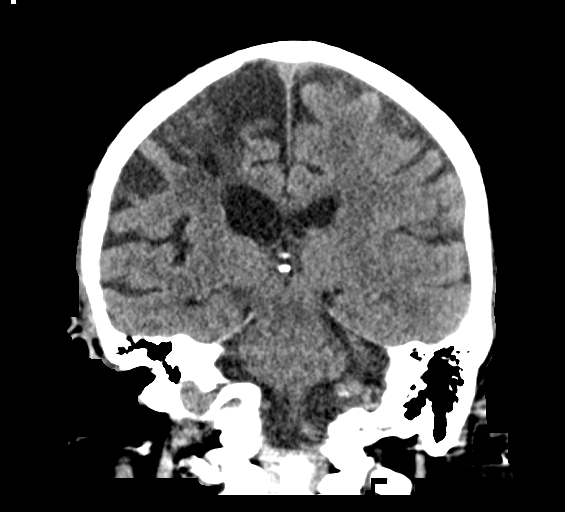
[im 40/72  brain]
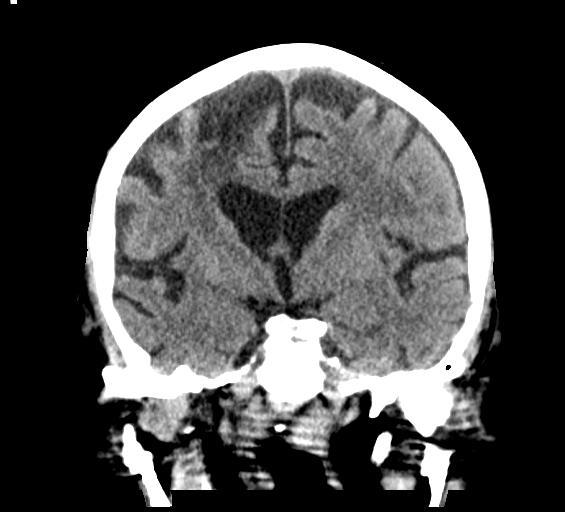

[Series 6: head 3.0 sag st · sagittal · 0.37mm/px · 3 of 67 slices shown]
[im 23/67  brain]
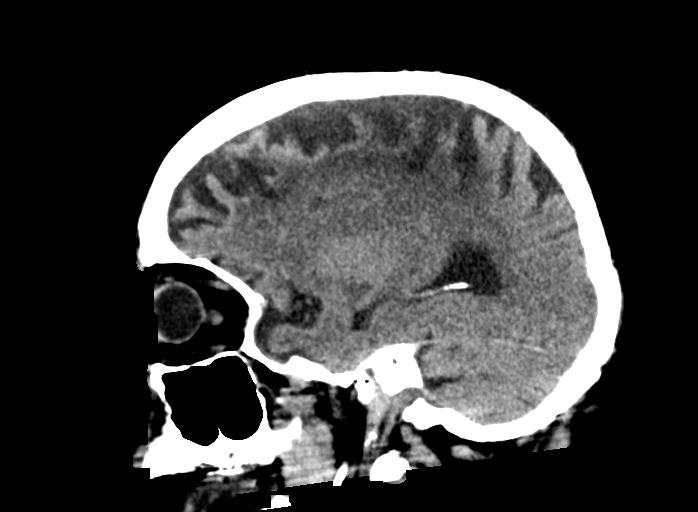
[im 34/67  brain]
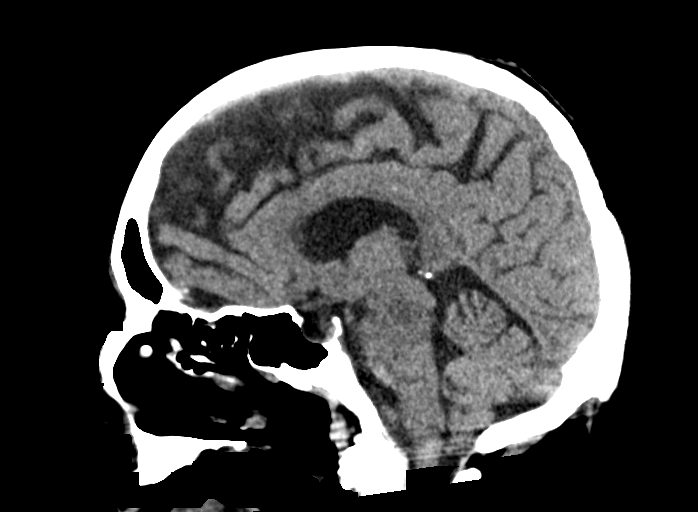
[im 45/67  brain]
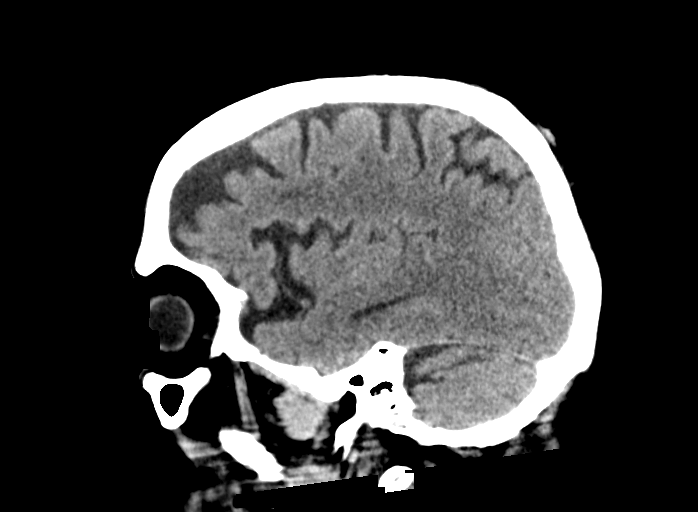

[15 of 47 positions shown; findings below may reference images not displayed]

FINDINGS: Brain: Mild atrophy. Ventricle size normal. Chronic right MCA
infarct over the convexity is unchanged.

Negative for acute infarct, hemorrhage, or mass.  No midline shift.

Vascular: Negative for hyperdense vessel

Skull: Negative

Sinuses/Orbits: Surgical repair of left orbital floor fracture.
Mucosal edema left maxillary sinus. Bilateral cataract surgery.

Other: None

ASPECTS (Alberta Stroke Program Early CT Score)

- Ganglionic level infarction (caudate, lentiform nuclei, internal
capsule, insula, M1-M3 cortex): 7

- Supraganglionic infarction (M4-M6 cortex): 3

Total score (0-10 with 10 being normal): 10
IMPRESSION: 1. No acute abnormality
2. Chronic right MCA infarct
3. ASPECTS is 10
4. These results were called by telephone at the time of
interpretation on 08/22/2018 at [DATE] to Dr. Allagi, who verbally
acknowledged these results.

## 2020-05-26 IMAGING — CT CT ABDOMEN AND PELVIS WITH CONTRAST
2 of 5 series · 14 of 46 positions shown, 16 images · IV contrast (Omni 300)
Comparison: Multiple exams, including CT chest from 03/28/2018 and
CT scan from 12/12/2012

CLINICAL DATA: Lung nodule.  Iron deficiency anemia.

EXAM:
CT CHEST, ABDOMEN, AND PELVIS WITH CONTRAST
TECHNIQUE: Multidetector CT imaging of the chest, abdomen and pelvis was
performed following the standard protocol during bolus
administration of intravenous contrast.
CONTRAST:  100mL OMNIPAQUE IOHEXOL 300 MG/ML  SOLN

[Series 3: cap with 5mm st · axial · 0.93mm/px · z∈[+1075,+1700]mm · 11 of 147 slices shown, 13 images]
[im 11/147  soft-tissue]
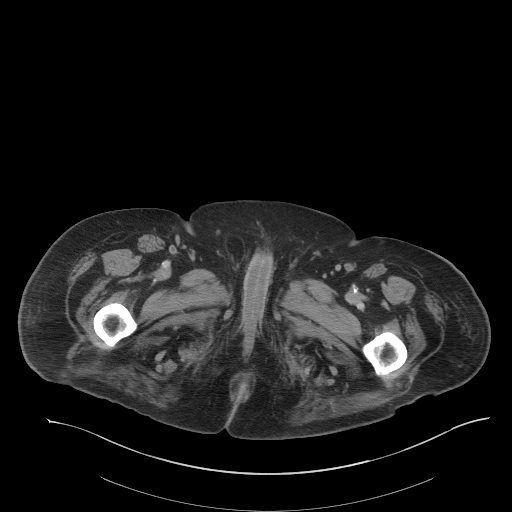
[im 11/147  bone]
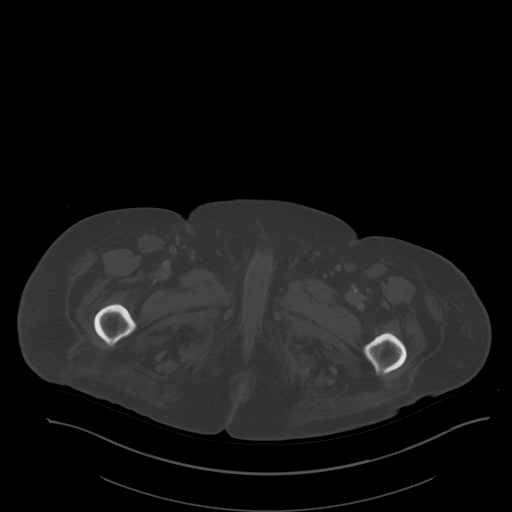
[im 21/147  soft-tissue]
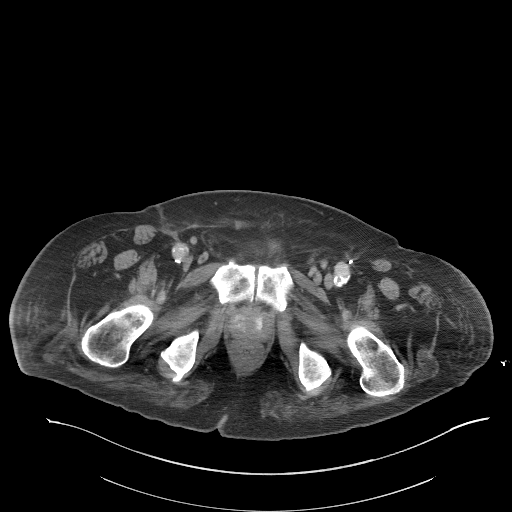
[im 32/147  soft-tissue]
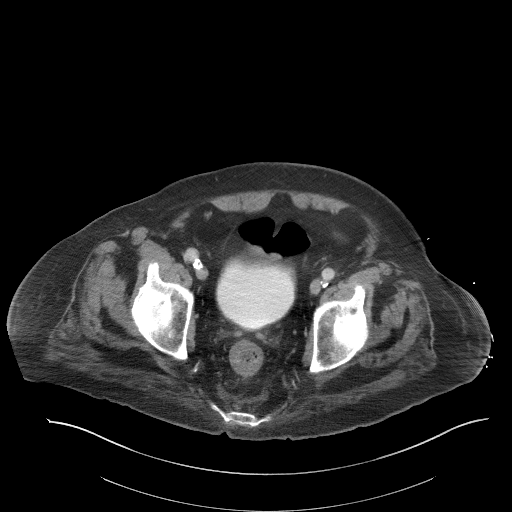
[im 53/147  soft-tissue]
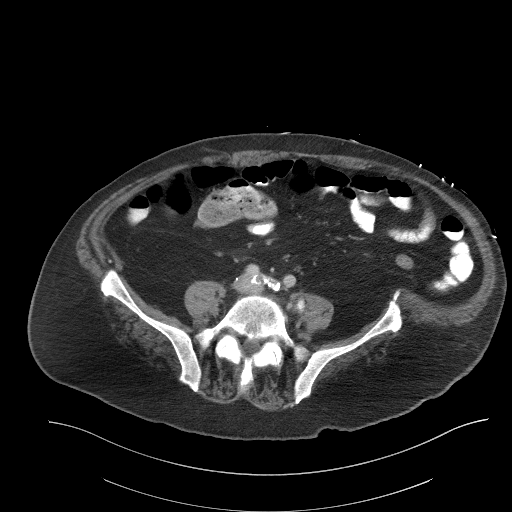
[im 63/147  soft-tissue]
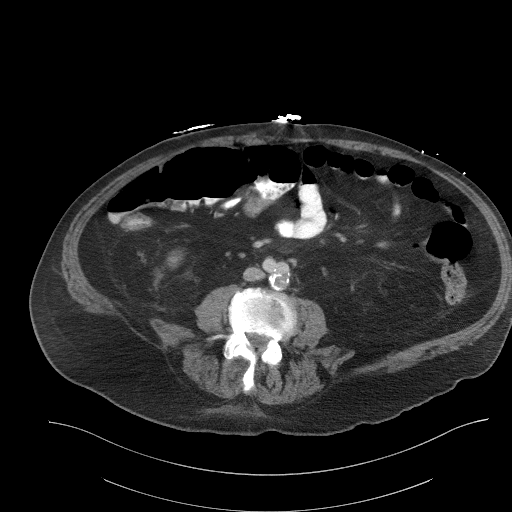
[im 74/147  soft-tissue]
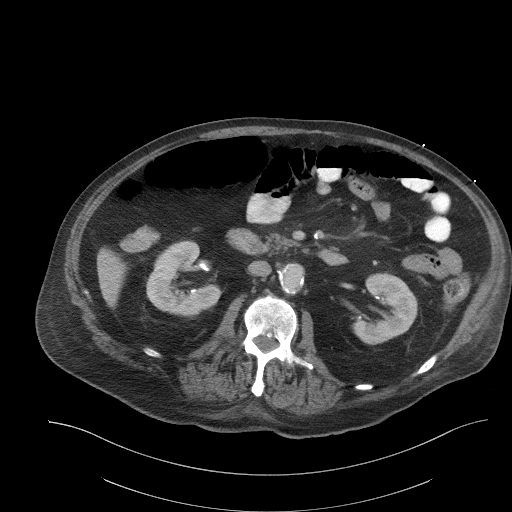
[im 84/147  soft-tissue]
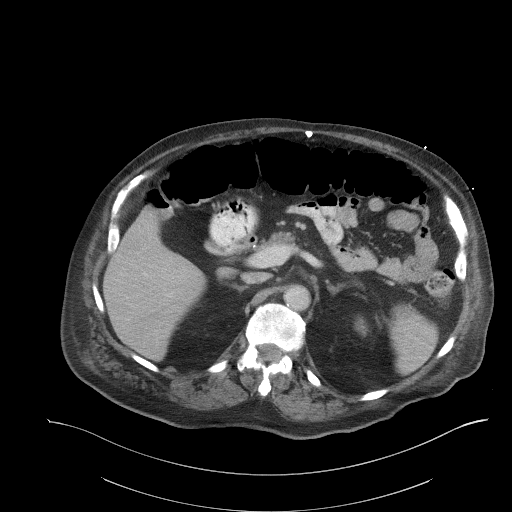
[im 94/147  soft-tissue]
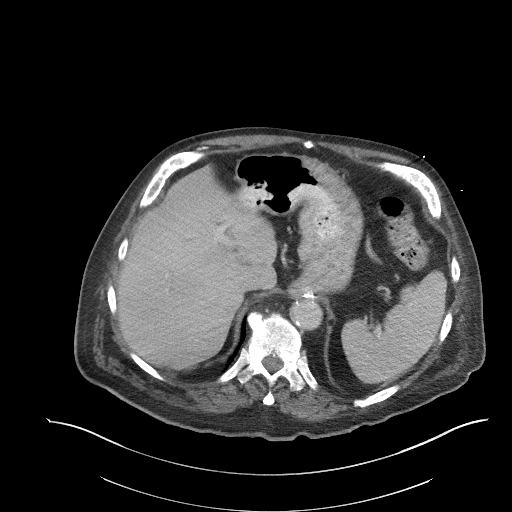
[im 115/147  soft-tissue]
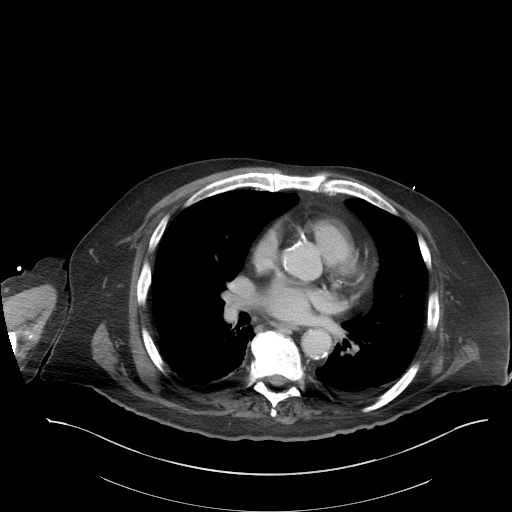
[im 115/147  bone]
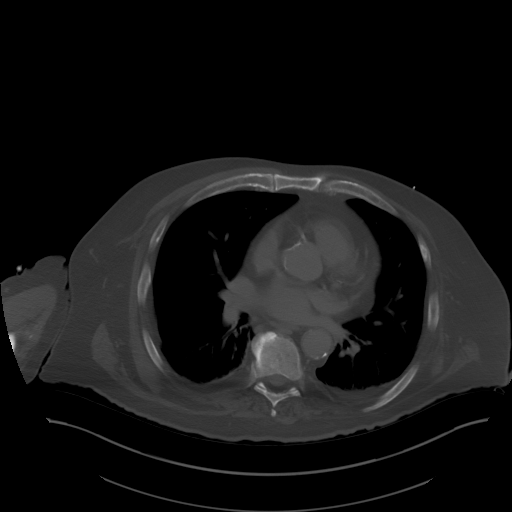
[im 126/147  soft-tissue]
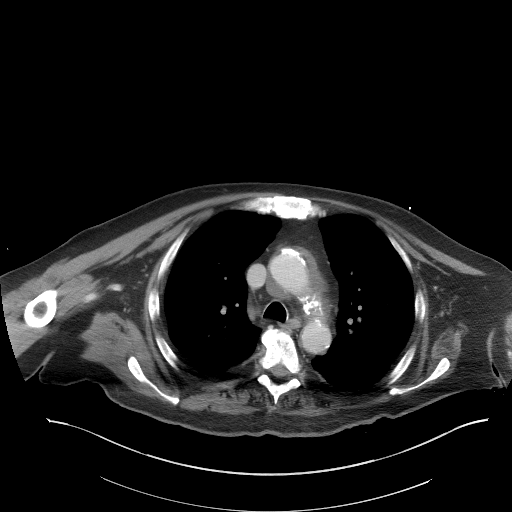
[im 136/147  soft-tissue]
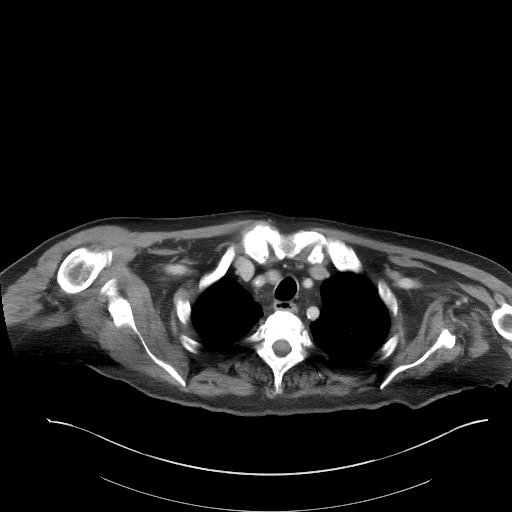

[Series 5: cap with 3mm st cor · coronal · 1.03mm/px · 3 of 172 slices shown]
[im 58/172  soft-tissue]
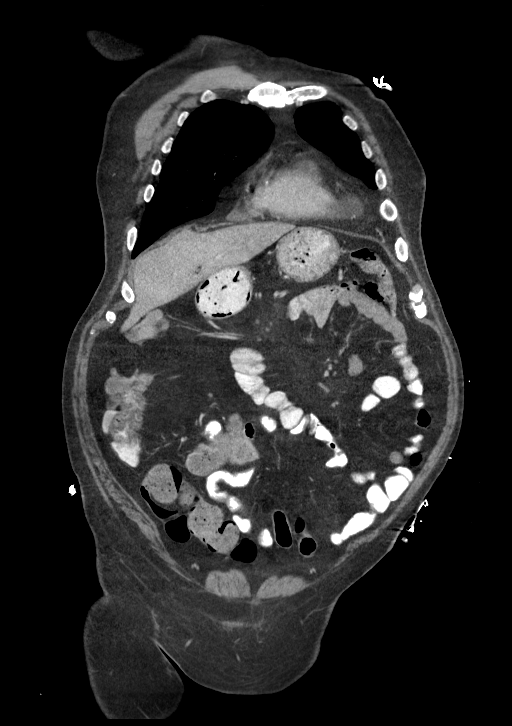
[im 77/172  soft-tissue]
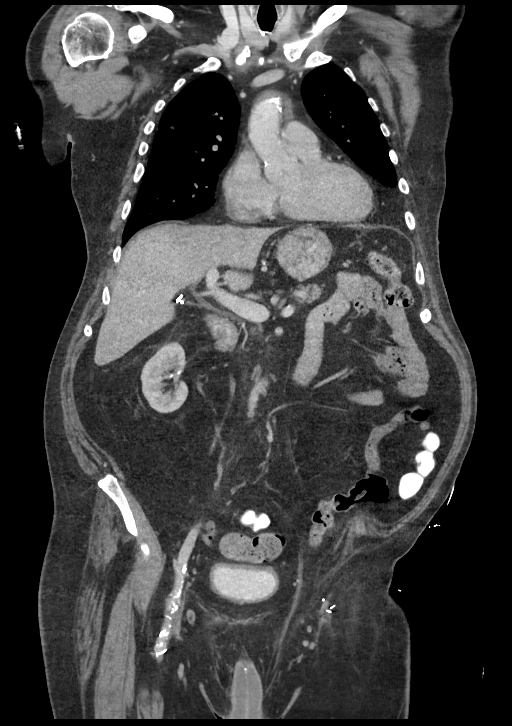
[im 96/172  soft-tissue]
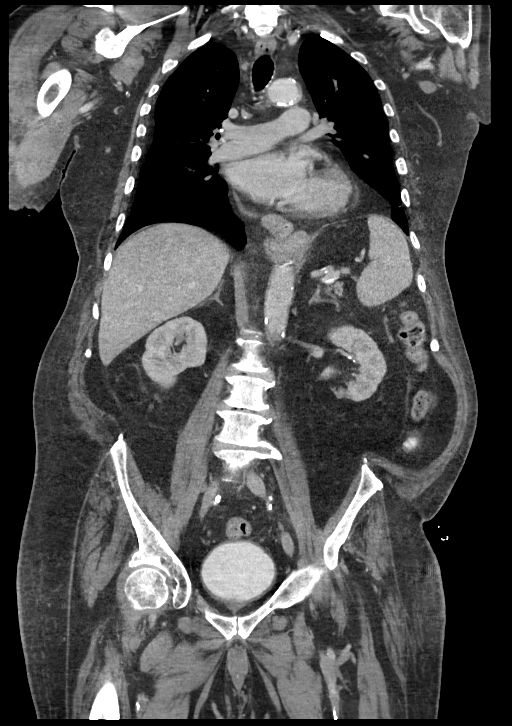

[14 of 46 positions shown; findings below may reference images not displayed]

FINDINGS: CT CHEST FINDINGS

Cardiovascular: Coronary, aortic arch, and branch vessel
atherosclerotic vascular disease. Mild cardiomegaly. Small
pericardial effusion.

Mediastinum/Nodes: Right paratracheal node 0.9 cm on image [DATE],
formerly 0.7 cm. Lower right paratracheal node 1.0 cm on image [DATE],
formerly 0.9 cm. A lower paratracheal node adjacent to the right
mainstem bronchus measures 0.9 cm short axis on image [DATE], stable.

Lungs/Pleura: Right upper lobe noncalcified pulmonary nodule
measures 1.9 by 1.5 cm on image 66/4. This is stable compared to
03/28/2018 but back on 12/12/2012 there is no readily appreciable
nodule in this vicinity. There is a separate calcified granuloma in
the right upper lobe.

Bandlike scarring or atelectasis in both lower lobes dependently.
Trace left pleural effusion.

Musculoskeletal: Mild thoracic spondylosis.

CT ABDOMEN PELVIS FINDINGS

Hepatobiliary: Cholecystectomy.  Otherwise unremarkable.

Pancreas: Unremarkable

Spleen: Unremarkable

Adrenals/Urinary Tract: 0.9 by 1.4 cm nodule of the apex of the left
adrenal gland, not changed from 12/12/2012 hence considered benign.

A 0.8 cm hypodense exophytic lesion of left mid kidney posteriorly
on image 72/3 is probably a cyst but technically too small to
characterize.

Vascular calcifications are noted at the renal hila.

Excreted contrast medium is present in the urinary bladder, likely
from yesterday's CT angiography.

Stomach/Bowel: There is evidence of a prior fundoplication at the
hiatus. Diffuse wall thickening is present in the rectum with single
wall thickness approximately 10 mm on image 118/3.

Vascular/Lymphatic: Aortoiliac atherosclerotic vascular disease.
Aortobifemoral stent graft. This appears patent. By femoral graft
with a midline stent, this by femoral graft appears occluded.
Probably occluded femoral to distal grafts.

Reproductive: Speckled calcifications in the prostate gland.

Other: No supplemental non-categorized findings.

Musculoskeletal: Mild lumbar spondylosis and degenerative disc
disease.
IMPRESSION: 1. Right upper lobe 1.9 by 1.5 cm pulmonary nodule, stable from
03/28/2018 but new compared to 12/12/2012. A slow growing
neoplasm/malignancy is a distinct possibility. The lesion has
lobular margins with some minimal spiculation. Although metastatic
lesion is not excluded, lung primary is probably more likely.
2. That said, there is abnormal wall thickening in the rectum, and a
rectal mass with metastatic disease is likewise not excluded.
3. Other imaging findings of potential clinical significance: Aortic
Atherosclerosis ([id number]). Coronary atherosclerosis. Mild
cardiomegaly. Small pericardial effusion. Upper normal sized right
paratracheal lymph nodes. Chronically stable small left adrenal
nodule, likely benign. Prior fundoplication. Aortobifemoral stent
graft is patent. By femoral graft in femoral to distal grafts are
likely occluded.

## 2020-05-27 ENCOUNTER — Telehealth: Payer: Medicare Other

## 2020-05-27 ENCOUNTER — Telehealth: Payer: Self-pay | Admitting: *Deleted

## 2020-05-27 DIAGNOSIS — Z7984 Long term (current) use of oral hypoglycemic drugs: Secondary | ICD-10-CM | POA: Diagnosis not present

## 2020-05-27 DIAGNOSIS — R339 Retention of urine, unspecified: Secondary | ICD-10-CM | POA: Diagnosis not present

## 2020-05-27 DIAGNOSIS — L89154 Pressure ulcer of sacral region, stage 4: Secondary | ICD-10-CM | POA: Diagnosis not present

## 2020-05-27 DIAGNOSIS — Z7902 Long term (current) use of antithrombotics/antiplatelets: Secondary | ICD-10-CM | POA: Diagnosis not present

## 2020-05-27 DIAGNOSIS — K922 Gastrointestinal hemorrhage, unspecified: Secondary | ICD-10-CM | POA: Diagnosis not present

## 2020-05-27 DIAGNOSIS — E1169 Type 2 diabetes mellitus with other specified complication: Secondary | ICD-10-CM | POA: Diagnosis not present

## 2020-05-27 DIAGNOSIS — L89159 Pressure ulcer of sacral region, unspecified stage: Secondary | ICD-10-CM | POA: Diagnosis not present

## 2020-05-27 DIAGNOSIS — Z8673 Personal history of transient ischemic attack (TIA), and cerebral infarction without residual deficits: Secondary | ICD-10-CM | POA: Diagnosis not present

## 2020-05-27 DIAGNOSIS — K5900 Constipation, unspecified: Secondary | ICD-10-CM | POA: Diagnosis not present

## 2020-05-27 DIAGNOSIS — I5030 Unspecified diastolic (congestive) heart failure: Secondary | ICD-10-CM | POA: Diagnosis not present

## 2020-05-27 DIAGNOSIS — J449 Chronic obstructive pulmonary disease, unspecified: Secondary | ICD-10-CM | POA: Diagnosis not present

## 2020-05-27 DIAGNOSIS — I110 Hypertensive heart disease with heart failure: Secondary | ICD-10-CM | POA: Diagnosis not present

## 2020-05-27 DIAGNOSIS — I951 Orthostatic hypotension: Secondary | ICD-10-CM | POA: Diagnosis not present

## 2020-05-27 DIAGNOSIS — N3000 Acute cystitis without hematuria: Secondary | ICD-10-CM | POA: Diagnosis not present

## 2020-05-27 DIAGNOSIS — M4620 Osteomyelitis of vertebra, site unspecified: Secondary | ICD-10-CM | POA: Diagnosis not present

## 2020-05-27 DIAGNOSIS — I69891 Dysphagia following other cerebrovascular disease: Secondary | ICD-10-CM | POA: Diagnosis not present

## 2020-05-27 DIAGNOSIS — Z466 Encounter for fitting and adjustment of urinary device: Secondary | ICD-10-CM | POA: Diagnosis not present

## 2020-05-27 NOTE — Progress Notes (Signed)
Hey I am not sure how I missed this but I just seen it

## 2020-05-27 NOTE — Progress Notes (Signed)
Marcie Bal looks like the referral closed from prior appt that completed on 05/21/2020.   Please let me know if you need me to do anything with this.  [place], [place] [address] Direct Dial: [phone] [email or web address]: [email or web address]

## 2020-05-27 NOTE — Telephone Encounter (Signed)
  Care Management   Note  05/27/2020 Name: [name] MRN: [id number] DOB: [birth year]  [name] is enrolled in a Managed Medicaid plan: No. Outreach attempt today was unsuccessful.   Patient's HCPOA [name] returned call to this CCM RN and left message stating she forgot about 4:00 pm initial assessment call scheduled  for today and requests return call tomorrow to reschedule   Messaged scheduling care guide [name] with Ms [name] request for return call tomorrow to reschedule the initial assessment.   Kenneth Churn RN, CCM, [facility] RN Care Manager [phone]

## 2020-05-27 NOTE — Telephone Encounter (Signed)
  Care Management   Outreach Note  05/27/2020 Name: [name] MRN: [id number] DOB: January 21, 1951  Referred by: Gaylan Gerold, DO Reason for referral : Care Coordination (HF, PVD, HTN, CVA, COPD, GERD, COVID, HLD, Dementia)   An unsuccessful telephone outreach was attempted today to complete initial intake assessment.  The patient was referred to the case management team for assistance with care management and care coordination.   Follow Up Plan: The care management team will reach out to the patient again over the next 7-14 days.   Kelli Churn RN, CCM, [facility] RN Care Manager [phone]

## 2020-05-28 ENCOUNTER — Emergency Department (HOSPITAL_COMMUNITY)
Admission: EM | Admit: 2020-05-28 | Discharge: 2020-05-29 | Disposition: A | Discharge status: Home / Self Care | Payer: Medicare Other | Attending: Emergency Medicine | Admitting: Emergency Medicine

## 2020-05-28 ENCOUNTER — Other Ambulatory Visit: Payer: Self-pay

## 2020-05-28 ENCOUNTER — Encounter (HOSPITAL_COMMUNITY): Payer: Self-pay | Admitting: Emergency Medicine

## 2020-05-28 ENCOUNTER — Ambulatory Visit: Payer: Self-pay

## 2020-05-28 DIAGNOSIS — R609 Edema, unspecified: Secondary | ICD-10-CM | POA: Diagnosis not present

## 2020-05-28 DIAGNOSIS — Z96659 Presence of unspecified artificial knee joint: Secondary | ICD-10-CM | POA: Insufficient documentation

## 2020-05-28 DIAGNOSIS — L89154 Pressure ulcer of sacral region, stage 4: Secondary | ICD-10-CM | POA: Diagnosis not present

## 2020-05-28 DIAGNOSIS — Z7902 Long term (current) use of antithrombotics/antiplatelets: Secondary | ICD-10-CM | POA: Diagnosis not present

## 2020-05-28 DIAGNOSIS — F0390 Unspecified dementia without behavioral disturbance: Secondary | ICD-10-CM | POA: Insufficient documentation

## 2020-05-28 DIAGNOSIS — E1169 Type 2 diabetes mellitus with other specified complication: Secondary | ICD-10-CM | POA: Diagnosis not present

## 2020-05-28 DIAGNOSIS — I110 Hypertensive heart disease with heart failure: Secondary | ICD-10-CM | POA: Insufficient documentation

## 2020-05-28 DIAGNOSIS — I5032 Chronic diastolic (congestive) heart failure: Secondary | ICD-10-CM | POA: Insufficient documentation

## 2020-05-28 DIAGNOSIS — K5900 Constipation, unspecified: Secondary | ICD-10-CM | POA: Insufficient documentation

## 2020-05-28 DIAGNOSIS — Z7951 Long term (current) use of inhaled steroids: Secondary | ICD-10-CM | POA: Diagnosis not present

## 2020-05-28 DIAGNOSIS — K922 Gastrointestinal hemorrhage, unspecified: Secondary | ICD-10-CM | POA: Diagnosis not present

## 2020-05-28 DIAGNOSIS — I5030 Unspecified diastolic (congestive) heart failure: Secondary | ICD-10-CM | POA: Diagnosis not present

## 2020-05-28 DIAGNOSIS — J449 Chronic obstructive pulmonary disease, unspecified: Secondary | ICD-10-CM | POA: Diagnosis not present

## 2020-05-28 DIAGNOSIS — Z7982 Long term (current) use of aspirin: Secondary | ICD-10-CM | POA: Insufficient documentation

## 2020-05-28 DIAGNOSIS — R58 Hemorrhage, not elsewhere classified: Secondary | ICD-10-CM | POA: Diagnosis not present

## 2020-05-28 DIAGNOSIS — R000 Tachycardia, unspecified: Secondary | ICD-10-CM | POA: Insufficient documentation

## 2020-05-28 DIAGNOSIS — Z85118 Personal history of other malignant neoplasm of bronchus and lung: Secondary | ICD-10-CM | POA: Insufficient documentation

## 2020-05-28 DIAGNOSIS — N2889 Other specified disorders of kidney and ureter: Secondary | ICD-10-CM | POA: Diagnosis not present

## 2020-05-28 DIAGNOSIS — J45909 Unspecified asthma, uncomplicated: Secondary | ICD-10-CM | POA: Insufficient documentation

## 2020-05-28 DIAGNOSIS — F17210 Nicotine dependence, cigarettes, uncomplicated: Secondary | ICD-10-CM | POA: Diagnosis not present

## 2020-05-28 DIAGNOSIS — I2510 Atherosclerotic heart disease of native coronary artery without angina pectoris: Secondary | ICD-10-CM | POA: Diagnosis not present

## 2020-05-28 DIAGNOSIS — Z466 Encounter for fitting and adjustment of urinary device: Secondary | ICD-10-CM | POA: Diagnosis not present

## 2020-05-28 DIAGNOSIS — E1140 Type 2 diabetes mellitus with diabetic neuropathy, unspecified: Secondary | ICD-10-CM | POA: Insufficient documentation

## 2020-05-28 DIAGNOSIS — I700 Atherosclerosis of aorta: Secondary | ICD-10-CM | POA: Diagnosis not present

## 2020-05-28 DIAGNOSIS — R0602 Shortness of breath: Secondary | ICD-10-CM | POA: Diagnosis not present

## 2020-05-28 DIAGNOSIS — Z79899 Other long term (current) drug therapy: Secondary | ICD-10-CM | POA: Insufficient documentation

## 2020-05-28 DIAGNOSIS — I69891 Dysphagia following other cerebrovascular disease: Secondary | ICD-10-CM | POA: Diagnosis not present

## 2020-05-28 DIAGNOSIS — Z8673 Personal history of transient ischemic attack (TIA), and cerebral infarction without residual deficits: Secondary | ICD-10-CM | POA: Diagnosis not present

## 2020-05-28 DIAGNOSIS — Z743 Need for continuous supervision: Secondary | ICD-10-CM | POA: Diagnosis not present

## 2020-05-28 DIAGNOSIS — Z7984 Long term (current) use of oral hypoglycemic drugs: Secondary | ICD-10-CM | POA: Diagnosis not present

## 2020-05-28 DIAGNOSIS — I951 Orthostatic hypotension: Secondary | ICD-10-CM | POA: Diagnosis not present

## 2020-05-28 DIAGNOSIS — R140 Abdominal distension (gaseous): Secondary | ICD-10-CM | POA: Diagnosis not present

## 2020-05-28 DIAGNOSIS — L89159 Pressure ulcer of sacral region, unspecified stage: Secondary | ICD-10-CM | POA: Diagnosis not present

## 2020-05-28 DIAGNOSIS — R339 Retention of urine, unspecified: Secondary | ICD-10-CM | POA: Diagnosis not present

## 2020-05-28 DIAGNOSIS — K449 Diaphragmatic hernia without obstruction or gangrene: Secondary | ICD-10-CM | POA: Diagnosis not present

## 2020-05-28 DIAGNOSIS — N3000 Acute cystitis without hematuria: Secondary | ICD-10-CM | POA: Diagnosis not present

## 2020-05-28 DIAGNOSIS — M4620 Osteomyelitis of vertebra, site unspecified: Secondary | ICD-10-CM | POA: Diagnosis not present

## 2020-05-28 DIAGNOSIS — R0902 Hypoxemia: Secondary | ICD-10-CM | POA: Diagnosis not present

## 2020-05-28 LAB — CBC WITH DIFFERENTIAL/PLATELET
Abs Immature Granulocytes: 0.05 K/uL (ref 0.00–0.07)
Basophils Absolute: 0.1 K/uL (ref 0.0–0.1)
Basophils Relative: 1 %
Eosinophils Absolute: 0.3 K/uL (ref 0.0–0.5)
Eosinophils Relative: 3 %
HCT: 43.7 % (ref 39.0–52.0)
Hemoglobin: 12.9 g/dL — ABNORMAL LOW (ref 13.0–17.0)
Immature Granulocytes: 1 %
Lymphocytes Relative: 16 %
Lymphs Abs: 1.5 K/uL (ref 0.7–4.0)
MCH: 26.5 pg (ref 26.0–34.0)
MCHC: 29.5 g/dL — ABNORMAL LOW (ref 30.0–36.0)
MCV: 89.9 fL (ref 80.0–100.0)
Monocytes Absolute: 1 K/uL (ref 0.1–1.0)
Monocytes Relative: 10 %
Neutro Abs: 6.5 K/uL (ref 1.7–7.7)
Neutrophils Relative %: 69 %
Platelets: 317 K/uL (ref 150–400)
RBC: 4.86 MIL/uL (ref 4.22–5.81)
RDW: 14.8 % (ref 11.5–15.5)
WBC: 9.3 K/uL (ref 4.0–10.5)
nRBC: 0 % (ref 0.0–0.2)

## 2020-05-28 LAB — COMPREHENSIVE METABOLIC PANEL WITH GFR
ALT: 18 U/L (ref 0–44)
AST: 16 U/L (ref 15–41)
Albumin: 3.3 g/dL — ABNORMAL LOW (ref 3.5–5.0)
Alkaline Phosphatase: 67 U/L (ref 38–126)
Anion gap: 8 (ref 5–15)
BUN: 10 mg/dL (ref 8–23)
CO2: 28 mmol/L (ref 22–32)
Calcium: 10.1 mg/dL (ref 8.9–10.3)
Chloride: 107 mmol/L (ref 98–111)
Creatinine, Ser: 0.69 mg/dL (ref 0.61–1.24)
GFR, Estimated: >60 mL/min
Glucose, Bld: 124 mg/dL — ABNORMAL HIGH (ref 70–99)
Potassium: 4.9 mmol/L (ref 3.5–5.1)
Sodium: 143 mmol/L (ref 135–145)
Total Bilirubin: 0.5 mg/dL (ref 0.3–1.2)
Total Protein: 6.8 g/dL (ref 6.5–8.1)

## 2020-05-28 LAB — LIPASE, BLOOD: Lipase: 23 U/L (ref 11–51)

## 2020-05-28 IMAGING — MR MRI HEAD WITHOUT AND WITH CONTRAST
15 series · 48 of 48 positions shown · IV contrast (gadavist)
Comparison: 08/22/2018 CT head, CTA head, CT perfusion head.

CLINICAL DATA: 68 y/o M; transient right-sided weakness. Slurred
speech. Altered level of consciousness.

EXAM:
MRI HEAD WITHOUT AND WITH CONTRAST
TECHNIQUE: Multiplanar, multiecho pulse sequences of the brain and surrounding
structures were obtained without and with intravenous contrast.
CONTRAST:  10 cc Gadavist

[Series 5: DWI · axial · 3.0mm · 0.88mm/px · z∈[-84,+53]mm · 7 of 94 slices shown (1 of 4)]
[im 1/94]
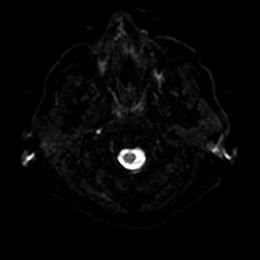
[im 16/94]
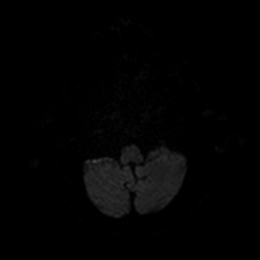
[im 32/94]
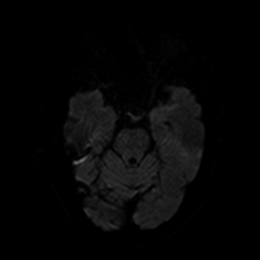
[im 47/94]
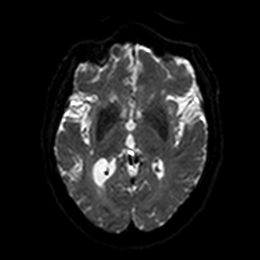
[im 63/94]
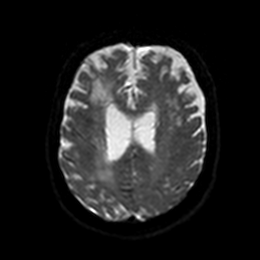
[im 78/94]
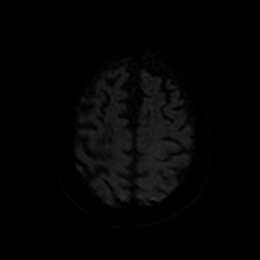
[im 94/94]
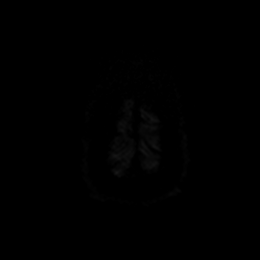

[Series 6: DWI · axial · 3.0mm · 0.88mm/px · z∈[-84,+53]mm · 3 of 47 slices shown (2 of 4)]
[im 1/47]
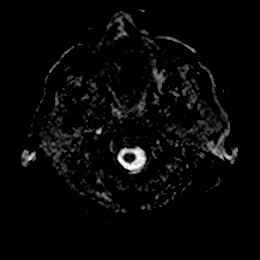
[im 24/47]
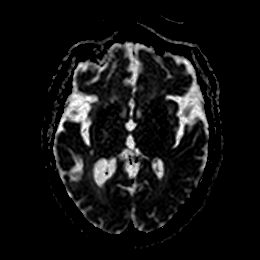
[im 47/47]
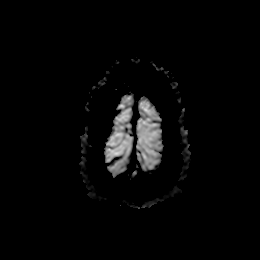

[Series 7: DWI · coronal · 4.0mm · 0.88mm/px · 5 of 70 slices shown (3 of 4)]
[im 1/70]
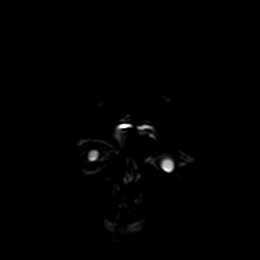
[im 18/70]
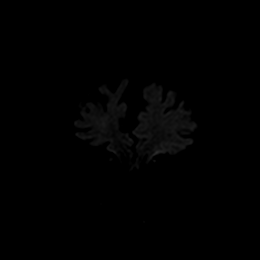
[im 35/70]
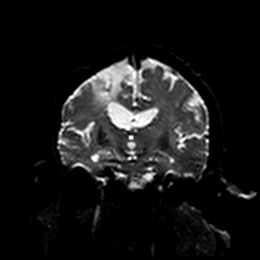
[im 52/70]
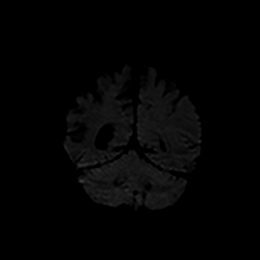
[im 70/70]
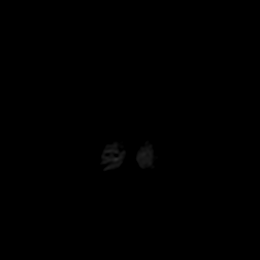

[Series 8: DWI · coronal · 4.0mm · 0.88mm/px · 3 of 35 slices shown (4 of 4)]
[im 1/35]
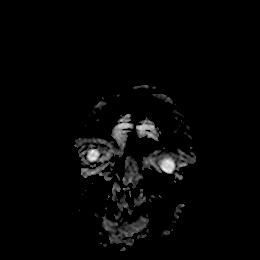
[im 18/35]
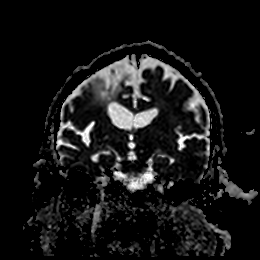
[im 35/35]
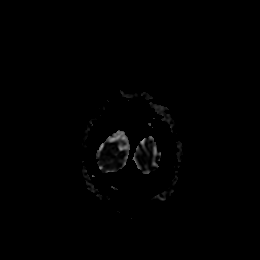

[Series 9: T1 · sagittal · 5.0mm · 0.75mm/px · 2 of 25 slices shown]
[im 1/25]
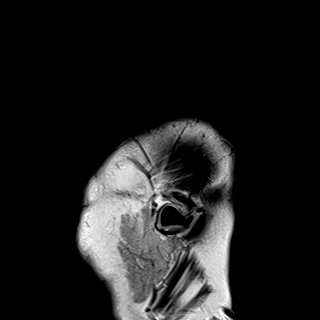
[im 25/25]
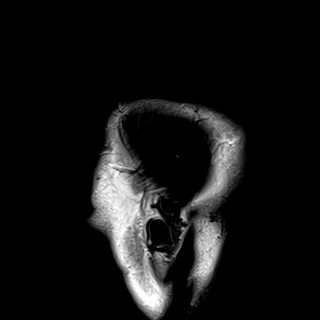

[Series 10: T2 · axial · 5.0mm · 0.72mm/px · z∈[-86,+56]mm · 2 of 25 slices shown]
[im 1/25]
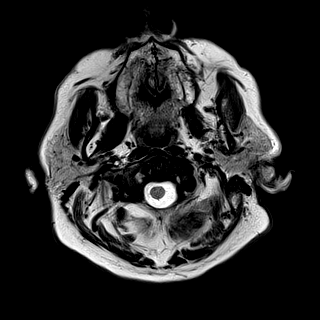
[im 25/25]
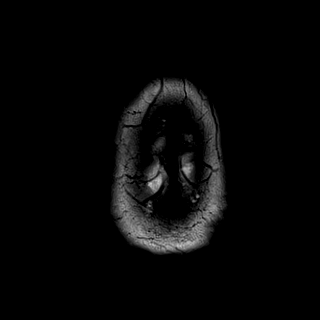

[Series 11: FLAIR · axial · 5.0mm · 0.45mm/px · z∈[-84,+59]mm · 2 of 25 slices shown (1 of 3)]
[im 1/25]
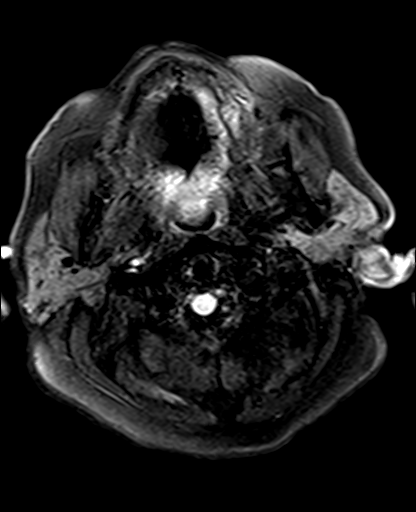
[im 25/25]
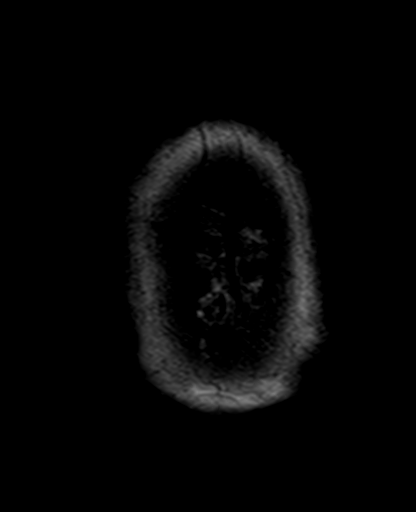

[Series 12: mag_images · axial · 3.0mm · 0.90mm/px · z∈[-94,+82]mm · 4 of 60 slices shown]
[im 1/60]
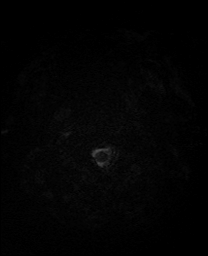
[im 20/60]
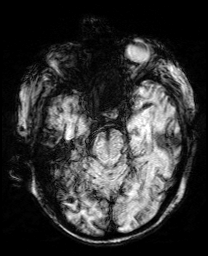
[im 40/60]
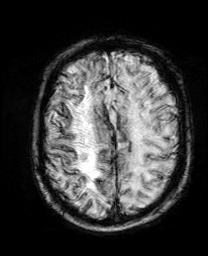
[im 60/60]
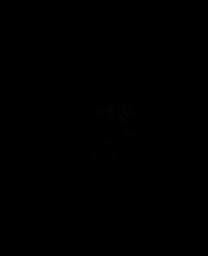

[Series 13: pha_images · axial · 3.0mm · 0.90mm/px · z∈[-94,+70]mm · 4 of 56 slices shown]
[im 1/56]
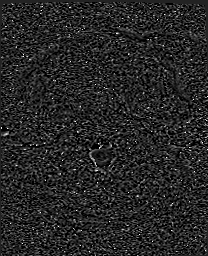
[im 19/56]
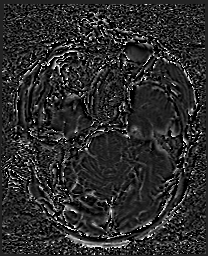
[im 37/56]
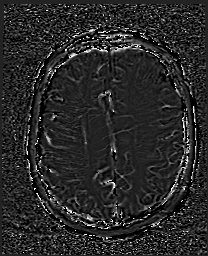
[im 56/56]
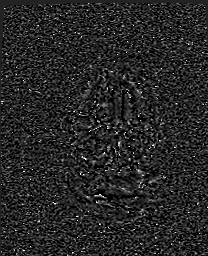

[Series 14: swi_images · axial · 3.0mm · 0.90mm/px · z∈[-94,+82]mm · 4 of 60 slices shown]
[im 1/60]
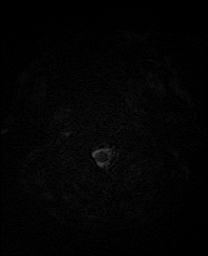
[im 20/60]
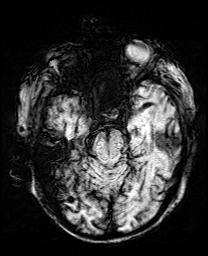
[im 40/60]
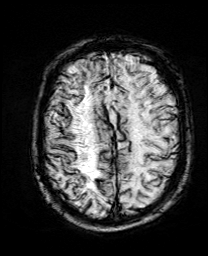
[im 60/60]
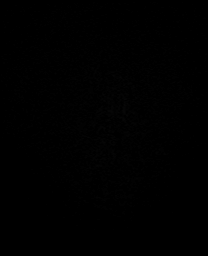

[Series 15: mip_images(sw) · axial · 24.0mm · 0.90mm/px · z∈[-83,+71]mm · 4 of 53 slices shown]
[im 1/53]
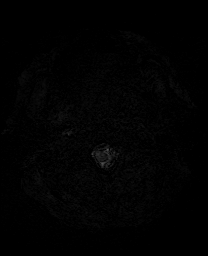
[im 18/53]
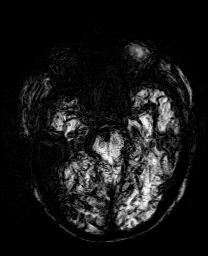
[im 35/53]
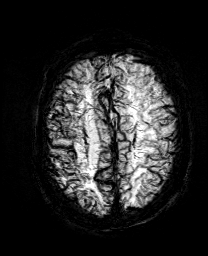
[im 53/53]
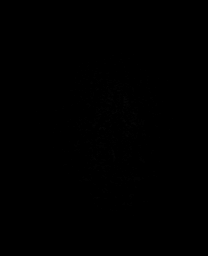

[Series 17: FLAIR · axial · 5.0mm · 0.90mm/px · z∈[-87,+55]mm · 2 of 25 slices shown (2 of 3)]
[im 1/25]
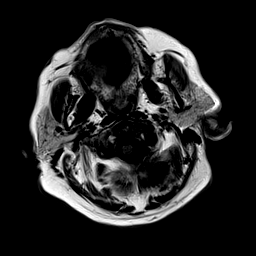
[im 25/25]
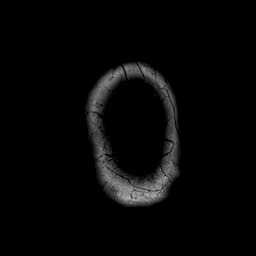

[Series 18: T2 post-contrast · coronal · 5.0mm · 0.72mm/px · 2 of 30 slices shown]
[im 1/30]
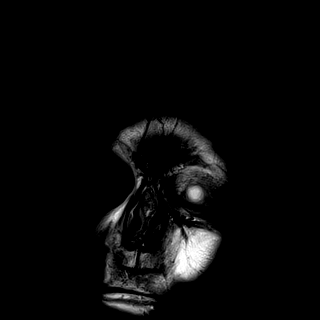
[im 30/30]
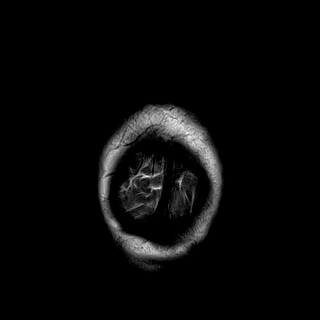

[Series 20: FLAIR · axial · 5.0mm · 0.90mm/px · z∈[-81,+63]mm · 2 of 25 slices shown (3 of 3)]
[im 1/25]
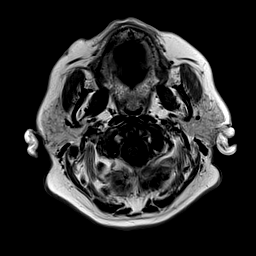
[im 25/25]
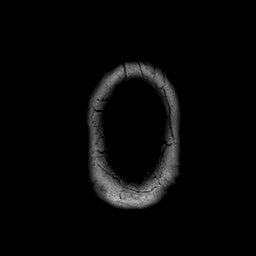

[Series 21: T1 post-contrast · coronal · 5.0mm · 0.34mm/px · 2 of 30 slices shown]
[im 1/30]
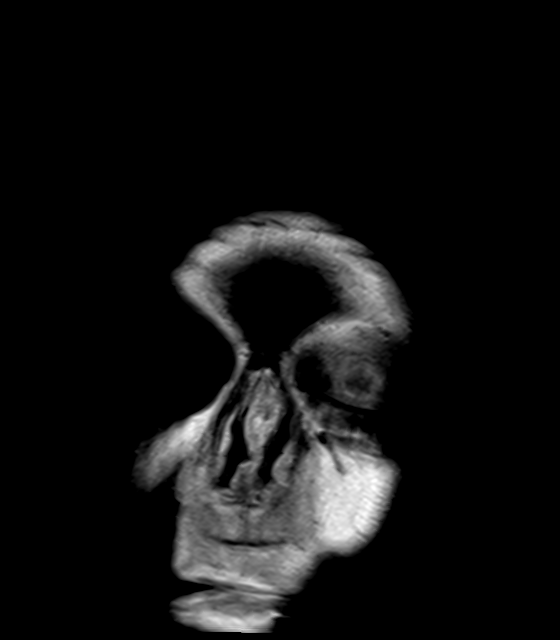
[im 30/30]
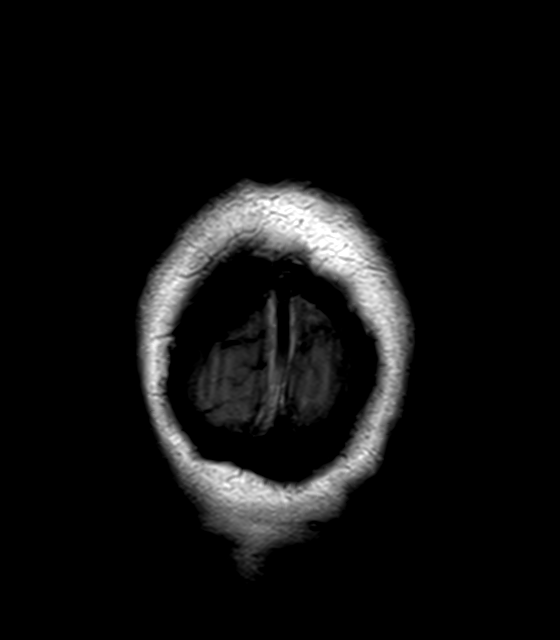

[48 of 48 positions shown; findings below may reference images not displayed]

FINDINGS: Brain: Chronic right superior frontal parietal infarction. Diffuse
susceptibility hypointensity throughout vascular structures
compatible with systemic iron therapy. No mass effect,
hydrocephalus, or herniation. Suboptimal assessment for subtle
extra-axial hemorrhage due to iron therapy, no visible hemorrhage on
recent CT. 2.4 cm length linear focus of faintly reduced diffusion
and enhancement within right frontal periventricular white matter
(series 20, image 17 and series 6, image 32) without mass effect,
likely subacute infarction. No additional focus of abnormal
intracranial enhancement. Background of moderate chronic
microvascular ischemic changes and volume loss of the brain. Ex
vacuo dilatation of the right lateral ventricle. Wallerian
degeneration of cortical spinal tracts extending into the right hemi
pons.

Vascular: Normal flow voids.

Skull and upper cervical spine: Normal marrow signal.

Sinuses/Orbits: Bilateral intra-ocular lens replacement. Partial
opacification of mastoid air cells and mild mucosal thickening of
the left maxillary sinus. Additional paranasal sinuses demonstrate
normal signal.

Other: None.
IMPRESSION: 1. Small likely subacute infarction within right frontal
periventricular white matter. No findings of acute stroke.
2. Chronic right superior frontoparietal infarction, moderate
chronic microvascular ischemic changes of white matter, and volume
loss of the brain.

## 2020-05-28 NOTE — ED Notes (Signed)
[name] called and wanted patient to know that she was waiting for him to get a room.

## 2020-05-28 NOTE — Patient Instructions (Signed)
Visit Information   PATIENT GOALS:  Goals Addressed   None     Consent to CCM Services: Mr. Bailly was given information about Chronic Care Management services today including:  1. CCM service includes personalized support from designated clinical staff supervised by his physician, including individualized plan of care and coordination with other care providers 2. 24/7 contact phone numbers for assistance for urgent and routine care needs. 3. Service will only be billed when office clinical staff spend 20 minutes or more in a month to coordinate care. 4. Only one practitioner may furnish and bill the service in a calendar month. 5. The patient may stop CCM services at any time (effective at the end of the month) by phone call to the office staff. 6. The patient will be responsible for cost sharing (co-pay) of up to 20% of the service fee (after annual deductible is met).  Patient agreed to services and verbal consent obtained.     The care management team will reach out to the patient again over the next 30 days.   [name], BSW, [facility]  CLINICAL CARE PLAN: There are no care plans to display for this patient.

## 2020-05-28 NOTE — Progress Notes (Signed)
[name] let me know when I can close referral

## 2020-05-28 NOTE — ED Triage Notes (Signed)
Pt BIB [facility] from home, pt bed-bound, has an aide, reports abdominal distention and LLQ tenderness. Pt's aide also reports black, tarry stools the last few days, pt taking plavix.

## 2020-05-28 NOTE — ED Triage Notes (Signed)
Emergency Medicine Provider Triage Evaluation Note  [name] , a 70 y.o. male  was evaluated in triage.  Pt complains of abdominal distension, caregiver noticed this, called EMS. Is tender. No nausea, vomiting. Does have tarry stool. States that he is on Plavix.   Review of Systems  Positive: abd distension, pain, tarry stools  Negative: N,v,d  Physical Exam  There were no vitals taken for this visit. Gen:   Awake, no distress   HEENT:  Atraumatic  Resp:  Normal effort  Cardiac:  Normal rate  Abd:   Distended and hard, no true TTP. No guarding  MSK:   Moves extremities without difficulty  Neuro:  Speech clear   Medical Decision Making  Medically screening exam initiated at 6:44 PM.  Appropriate orders placed.  [name] was informed that the remainder of the evaluation will be completed by another provider, this initial triage assessment does not replace that evaluation, and the importance of remaining in the ED until their evaluation is complete.  Clinical Impression  MSE was initiated and I personally evaluated the patient and placed orders (if any) at  6:45 PM on May 28, 2020.  The patient appears stable so that the remainder of the MSE may be completed by another provider.    Alfredia Client, PA-C 05/28/20 1856

## 2020-05-28 NOTE — Progress Notes (Signed)
Alexis   This patients HCPOA [name] would like to speak to BSW about permeant placement prior to speaking with Marcie Bal. She prefers that you call 3 or after if possible.  [place], [place] [address] Direct Dial: [phone] [email or web address]: [email or web address]
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
## 2020-05-29 ENCOUNTER — Ambulatory Visit: Payer: Self-pay

## 2020-05-29 ENCOUNTER — Emergency Department (HOSPITAL_COMMUNITY): Payer: Medicare Other

## 2020-05-29 ENCOUNTER — Telehealth: Payer: Self-pay

## 2020-05-29 DIAGNOSIS — K449 Diaphragmatic hernia without obstruction or gangrene: Secondary | ICD-10-CM | POA: Diagnosis not present

## 2020-05-29 DIAGNOSIS — R140 Abdominal distension (gaseous): Secondary | ICD-10-CM | POA: Diagnosis not present

## 2020-05-29 DIAGNOSIS — N2889 Other specified disorders of kidney and ureter: Secondary | ICD-10-CM | POA: Diagnosis not present

## 2020-05-29 DIAGNOSIS — Z743 Need for continuous supervision: Secondary | ICD-10-CM | POA: Diagnosis not present

## 2020-05-29 DIAGNOSIS — R279 Unspecified lack of coordination: Secondary | ICD-10-CM | POA: Diagnosis not present

## 2020-05-29 DIAGNOSIS — I700 Atherosclerosis of aorta: Secondary | ICD-10-CM | POA: Diagnosis not present

## 2020-05-29 DIAGNOSIS — R6889 Other general symptoms and signs: Secondary | ICD-10-CM | POA: Diagnosis not present

## 2020-05-29 LAB — URINALYSIS, ROUTINE W REFLEX MICROSCOPIC
Bilirubin Urine: NEGATIVE
Glucose, UA: NEGATIVE mg/dL
Hgb urine dipstick: NEGATIVE
Ketones, ur: NEGATIVE mg/dL
Leukocytes,Ua: NEGATIVE
Nitrite: NEGATIVE
Protein, ur: NEGATIVE mg/dL
Specific Gravity, Urine: 1.013 (ref 1.005–1.030)
pH: 6 (ref 5.0–8.0)

## 2020-05-29 LAB — CBC WITH DIFFERENTIAL/PLATELET
Abs Immature Granulocytes: 0.04 10*3/uL (ref 0.00–0.07)
Basophils Absolute: 0.1 10*3/uL (ref 0.0–0.1)
Basophils Relative: 1 %
Eosinophils Absolute: 0.2 10*3/uL (ref 0.0–0.5)
Eosinophils Relative: 3 %
HCT: 39.7 % (ref 39.0–52.0)
Hemoglobin: 12.1 g/dL — ABNORMAL LOW (ref 13.0–17.0)
Immature Granulocytes: 1 %
Lymphocytes Relative: 17 %
Lymphs Abs: 1.3 10*3/uL (ref 0.7–4.0)
MCH: 27.1 pg (ref 26.0–34.0)
MCHC: 30.5 g/dL (ref 30.0–36.0)
MCV: 88.8 fL (ref 80.0–100.0)
Monocytes Absolute: 0.8 10*3/uL (ref 0.1–1.0)
Monocytes Relative: 10 %
Neutro Abs: 5.2 10*3/uL (ref 1.7–7.7)
Neutrophils Relative %: 68 %
Platelets: 273 10*3/uL (ref 150–400)
RBC: 4.47 MIL/uL (ref 4.22–5.81)
RDW: 14.7 % (ref 11.5–15.5)
WBC: 7.5 10*3/uL (ref 4.0–10.5)
nRBC: 0 % (ref 0.0–0.2)

## 2020-05-29 LAB — PROTIME-INR
INR: 1.1 (ref 0.8–1.2)
Prothrombin Time: 14.3 seconds (ref 11.4–15.2)

## 2020-05-29 LAB — LACTIC ACID, PLASMA: Lactic Acid, Venous: 1.2 mmol/L (ref 0.5–1.9)

## 2020-05-29 MED ORDER — FENTANYL CITRATE (PF) 100 MCG/2ML IJ SOLN
50.0000 ug | Freq: Once | INTRAMUSCULAR | Status: AC
Start: 2020-05-29 — End: 2020-05-29
  Administered 2020-05-29: 50 ug via INTRAVENOUS
  Filled 2020-05-29: qty 2

## 2020-05-29 MED ORDER — MAGNESIUM CITRATE PO SOLN: 1.0000 | Freq: Every day | ORAL | 2 refills | Status: DC | PRN

## 2020-05-29 MED ORDER — POLYETHYLENE GLYCOL 3350 17 G PO PACK: 17.0000 g | PACK | Freq: Every day | ORAL | 0 refills | Status: DC

## 2020-05-29 MED ORDER — IOHEXOL 300 MG/ML  SOLN
100.0000 mL | Freq: Once | INTRAMUSCULAR | Status: AC | PRN
  Administered 2020-05-29: 100 mL via INTRAVENOUS

## 2020-05-29 NOTE — Chronic Care Management (AMB) (Signed)
Chronic Care Management    Social Work Note  05/29/2020 Name: [name] MRN: [id number] DOB: 01-Aug-1950  [name] is a 70 y.o. year old male who is a primary care patient of Kenneth Gerold, DO. The CCM team was consulted to assist the patient with chronic disease management and/or care coordination needs related to: Level of Care Concerns.   Engaged with patient Engaged with patient by telephone for follow up visit in response to provider referral for social work chronic care management and care coordination services.   Consent to Services:  Patient was given the following information about Chronic Care Management services today, agreed to services, and gave verbal consent: 1. CCM service includes personalized support from designated clinical staff supervised by primary care provider, including individualized plan of care and coordination with other care providers 2. 24/7 contact phone numbers for assistance for urgent and routine care needs. 3. Service will only be billed when office clinical staff spend 20 minutes or more in a month to coordinate care. 4. Only one practitioner may furnish and bill the service in a calendar month. 5.The patient may stop CCM services at any time (effective at the end of the month) by phone call to the office staff. 6. The patient will be responsible for cost sharing (co-pay) of up to 20% of the service fee (after annual deductible is met). Patient agreed to services and consent obtained.  Patient agreed to services and consent obtained.   Assessment/Interventions: Review of patient past medical history, allergies, medications, and health status, including review of relevant consultants reports was performed today as part of a comprehensive evaluation and provision of chronic care management and care coordination services.     SDOH (Social Determinants of Health) assessments and interventions performed:    BSW receieved a telephone call from  patient's caregiver [name] stating that patient was sent to the ER and being discharged this morning. CG stated that it is a lot for her and she feels as though patient will benefit from been in a 24 hour facility. CG asked if BSW could assist with securing placement for patient.  Advanced Directives Status: Not addressed in this encounter.  CCM Care Plan  Allergies  Allergen Reactions  . Darvocet [Propoxyphene N-Acetaminophen] Hives  . Haldol [Haloperidol Decanoate] Hives  . Acetaminophen Nausea Only    Upset stomach, tolerates Hydrocodone/APAP if taken with food  . Metformin And Related Nausea And Vomiting  . Norco [Hydrocodone-Acetaminophen] Nausea And Vomiting    Tolerates if taken with food    @THNMEDREVIEW @  Patient Active Problem List   Diagnosis Date Noted  . Urinary retention 04/18/2020  . Constipation   . Upper GI bleed   . Chronic osteomyelitis ([place]) 04/12/2020  . Pressure injury of skin 02/27/2020  . Pleural effusion, left 02/27/2020  . Renal insufficiency 02/27/2020  . Adenocarcinoma of right lung, stage 1 ([place]) 04/26/2019  . Gastric and duodenal angiodysplasia   . Iron deficiency anemia   . Heme positive stool   . Stenosis of brachiocephalic artery ([place]) 42/87/6811  . Lung nodule, solitary 03/28/2018  . Dementia due to another medical condition, without behavioral disturbance ([place])   . Insomnia 01/29/2016  . Psoriasiform dermatitis 07/12/2015  . Diabetes mellitus ([place]) 03/13/2015  . History of Hemiparesis affecting left side as late effect of cerebrovascular accident ([place]) 02/18/2015  . Coronary artery disease involving native coronary artery of native heart without angina pectoris   . HLD (hyperlipidemia) 11/01/2014  . Tobacco use  disorder 11/01/2014  . Essential hypertension   . Polysubstance abuse ([place]) 08/28/2014  . Intracranial carotid stenosis   . History of DVT (deep vein thrombosis)   . Alcohol use disorder, moderate, dependence ([place])   .  Stroke ([place])   . Chronic diastolic congestive heart failure ([place]) 11/21/2013  . GERD (gastroesophageal reflux disease) 08/10/2013  . Gout 08/10/2013  . Peripheral vascular disease ([place]) 07/27/2012  . COPD (chronic obstructive pulmonary disease) ([place]) 04/04/2012    Conditions to be addressed/monitored per PCP order: level of care; Level of care concerns  There are no care plans to display for this patient.    Follow Up Plan: SW will follow up with patient by phone over the next 3-7 days      [name], [place], [place]

## 2020-05-29 NOTE — Discharge Instructions (Signed)
There are multiple ways to improve your constipation, some options are listed below and all involve over the counter medications:   ?* You can try the magnesium citrate 1 bottle per day until you get results. ?* You can try an enema once daily as needed.  ?* Generally, you need to increase your fluid intake and take stool softeners twice a day until you follow-up with your primary doctor for further management. ?* MiraLAX is an osmotic laxative. This means that it will keep electrolytes and water in your stool and allow you to have easier bowel movements.  ?      * One option is to take 8 capfuls of MiraLAX and put in a 32 ounce Gatorade and shake it up.  Drink 4-6 ounces per hour until you have results.  Another way is per the directions below. ?      * You  can take this medication up to 3 times daily as needed produce bowel movements. However while you're taking this much MiraLAX you need to make sure you are replenishing her electrolytes and water loss. He should also not take it more than 2 days in a row at this level. Generally I recommend people workup to this. For example take 1 capful of MiraLAX once a day for 2-3 days and if you do not get improvement in your bowel habits then take 1 capful twice a day for 2-3 days and if they don't get relief from this and you can take 1 capful 3 times a day for 2 days. If you start having diarrhea, decrease the amount of MiraLAX you are using until you have soft formed stools once to twice a day. Remember during this process that you need to increase your electrolytes and water intake, so oftentimes sports drinks are good for this. Some people end up taking half or one whole capful daily for the rest of her lives to have regular bowel movements you can do this as you feel is proper. ?

## 2020-05-29 NOTE — Telephone Encounter (Signed)
Thank you for the update. Appreciate CCM team assistance in finding placement for this patient.

## 2020-05-29 NOTE — ED Provider Notes (Signed)
[facility] EMERGENCY DEPARTMENT Provider Note   CSN: [id number] Arrival date & time: 05/28/20  1851     History Chief Complaint  Patient presents with  . Abdominal Pain    [name] is a 70 y.o. male.  70 year old male with multi medical problems documented below who presents to the emergency room today with abdominal distention.  Patient states that started throughout the day today.  He had a couple bowel movements but they have been relatively loose.  He also had 1 hard 1.  He states his bowels are always irregular.  He still on some chronic pain medication.  No fever, cough, nausea, vomiting.  Still passing gas as well.  No other associated symptoms.  Patient is a bedbound and was recently admitted to hospital a month ago for GI bleed and osteomyelitis.  He has been stable from that standpoint.  States he does have some intermittent dark tarry stools but nothing persistent.  He is on Plavix   Abdominal Pain      Past Medical History:  Diagnosis Date  . Acute blood loss anemia   . Acute CVA (cerebrovascular accident) ([place]) 08/27/2014  . Acute encephalopathy 11/28/2017  . Acute ischemic stroke ([place])   . AKI (acute kidney injury) ([place])   . Alcohol abuse    H/O  . Alcohol dependence with withdrawal with complication ([place]) 8/75/6433  . Anxiety   . Arterial ischemic stroke, MCA (middle cerebral artery), right, acute ([place])   . Arthritis   . Asthma   . Binocular vision disorder with diplopia 01/03/2013  . CAD (coronary artery disease) 05/27/10   Cath: severe single vessell CAD left cx midportion obtuse marginal 2 to 3.  . Cerebral infarction due to embolism of right middle cerebral artery ([place])   . Cerebral infarction due to stenosis of right carotid artery ([place]) 11/01/2014  . Cerebral thrombosis with cerebral infarction 07/18/2016  . Cerebrovascular accident (CVA) due to stenosis of right carotid artery ([place])   . CHF (congestive heart failure) ([place])   .  Closed blow-out fracture of floor of orbit ([place]) 01/19/2013  . COPD (chronic obstructive pulmonary disease) ([place])   . COPD (chronic obstructive pulmonary disease) ([place])   . CVA (cerebral vascular accident) ([place]) 08/26/2014  . Dementia due to another medical condition ([place]) 07/12/2015  . Depression   . Diabetes mellitus, type 2 ([place]) 03/13/2015  . Diastolic CHF, acute on chronic (HCC) 11/01/2014  . DOE (dyspnea on exertion) 11/23/2014  . DVT (deep venous thrombosis) ([place]) 02/22/2014  . Embolic stroke ([place] Amb[place])   . Enophthalmos due to trauma 01/19/2013  . Eyelid lesion 01/24/2013  . Fracture of inferior orbital wall ([place]) 01/03/2013  . Fracture of orbital floor ([place]) 01/03/2013  . GERD (gastroesophageal reflux disease)   . H/O total knee replacement 10/24/2014  . HCAP (healthcare-associated pneumonia) 10/15/2015  . Headache around the eyes 08/27/2014  . History of DVT (deep vein thrombosis)   . History of DVT of lower extremity   . Hyperkalemia 10/24/2014  . Hyperlipemia   . Hyperlipidemia   . Hypertension   . Hypoalbuminemia due to protein-calorie malnutrition ([place])   . Hypotension   . Labile blood pressure   . Left shoulder pain 09/28/2017  . Lower extremity edema 02/21/2014  . Medial orbital wall fracture (HCC) 01/19/2013  . Myocardial infarction ([place]) 2000  . Orbital fracture ([place]) 12/2012  . OSA on CPAP   . Pain of left eye 01/03/2013  . Peripheral vascular disease,  unspecified ([place])    08/20/10 doppler: increase in right ABI post-op. Left ABI stable. S/P bi-fem bypass surgery  . Pneumonia 04/04/2012  . Right middle cerebral artery stroke ([place]) 01/18/2018  . Seizures ([place])   . Shortness of breath   . Stroke (cerebrum) (Milaca) 02/06/2015  . Thrombocytopenia ([place])   . Tobacco abuse   . Trichiasis without entropion 04/16/2013   Overview:  LLL central   . Type 2 diabetes mellitus with peripheral neuropathy ([place]) 03/13/2015  . Urinary incontinence 03/14/2015    Patient Active Problem List    Diagnosis Date Noted  . Urinary retention 04/18/2020  . Constipation   . Upper GI bleed   . Chronic osteomyelitis ([place]azad[place]) 04/12/2020  . Pressure injury of skin 02/27/2020  . Pleural effusion, left 02/27/2020  . Renal insufficiency 02/27/2020  . Adenocarcinoma of right lung, stage 1 ([place]onderosa) 04/26/2019  . Gastric and duodenal angiodysplasia   . Iron deficiency anemia   . Heme positive stool   . Stenosis of brachiocephalic artery ([place]) 36/64/4034  . Lung nodule, solitary 03/28/2018  . Dementia due to another medical condition, without behavioral disturbance ([place])   . Insomnia 01/29/2016  . Psoriasiform dermatitis 07/12/2015  . Diabetes mellitus ([place]) 03/13/2015  . History of Hemiparesis affecting left side as late effect of cerebrovascular accident ([place]) 02/18/2015  . Coronary artery disease involving native coronary artery of native heart without angina pectoris   . HLD (hyperlipidemia) 11/01/2014  . Tobacco use disorder 11/01/2014  . Essential hypertension   . Polysubstance abuse ([place]) 08/28/2014  . Intracranial carotid stenosis   . History of DVT (deep vein thrombosis)   . Alcohol use disorder, moderate, dependence ([place])   . Stroke ([place])   . Chronic diastolic congestive heart failure ([place]) 11/21/2013  . GERD (gastroesophageal reflux disease) 08/10/2013  . Gout 08/10/2013  . Peripheral vascular disease ([place]) 07/27/2012  . COPD (chronic obstructive pulmonary disease) ([place]) 04/04/2012    Past Surgical History:  Procedure Laterality Date  . abdoninal ao angio & bifem angio  05/27/10   Patent graft, occluded bil stents with no retrograde flow into the hypogastric arteries. 100% occl left ant. tibial artery, 70% to 80% to 100% stenosis right superficial fem artery above adductor canal. 100 % occl right ant. tibial vessell  . Aortogram w/ PTCA  09/292003  . BACK SURGERY    . BIOPSY  08/26/2018   Procedure: BIOPSY;  Surgeon: Lavena Bullion, DO;  Location: [facility];  Service:  Gastroenterology;;  . CARDIAC CATHETERIZATION  05/27/10   severe CAD left cx  . CHOLECYSTECTOMY    . ESOPHAGOGASTRIC FUNDOPLICATION    . ESOPHAGOGASTRODUODENOSCOPY (EGD) WITH PROPOFOL N/A 08/26/2018   Procedure: ESOPHAGOGASTRODUODENOSCOPY (EGD) WITH PROPOFOL;  Surgeon: Lavena Bullion, DO;  Location: [facility];  Service: Gastroenterology;  Laterality: N/A;  . EYE SURGERY    . FEMORAL BYPASS  08/19/10   Right Fem-Pop  . FLEXIBLE SIGMOIDOSCOPY N/A 08/26/2018   Procedure: FLEXIBLE SIGMOIDOSCOPY;  Surgeon: Lavena Bullion, DO;  Location: [facility];  Service: Gastroenterology;  Laterality: N/A;  . HOT HEMOSTASIS N/A 08/26/2018   Procedure: HOT HEMOSTASIS (ARGON PLASMA COAGULATION/BICAP);  Surgeon: Lavena Bullion, DO;  Location[facility];  Service: Gastroenterology;  Laterality: N/A;  . IR ANGIO INTRA EXTRACRAN SEL COM CAROTID INNOMINATE BILAT MOD SED  07/20/2016  . IR ANGIO INTRA EXTRACRAN SEL COM CAROTID INNOMINATE BILAT MOD SED  01/14/2018  . IR ANGIO VERTEBRAL SEL SUBCLAVIAN INNOMINATE UNI R MOD SED  07/20/2016  .  IR ANGIO VERTEBRAL SEL VERTEBRAL BILAT MOD SED  01/14/2018  . IR ANGIO VERTEBRAL SEL VERTEBRAL UNI L MOD SED  07/20/2016  . IR RADIOLOGIST EVAL & MGMT  05/27/2016  . IR US GUIDE VASC ACCESS RIGHT  01/14/2018  . JOINT REPLACEMENT    . PERIPHERAL VASCULAR CATHETERIZATION N/A 12/10/2014   Procedure: Abdominal Aortogram;  Surgeon: Angelia Mould, MD;  Location: [facility] CV LAB;  Service: Cardiovascular;  Laterality: N/A;  . POLYPECTOMY  08/26/2018   Procedure: POLYPECTOMY;  Surgeon: Lavena Bullion, DO;  Location: [facility];  Service: Gastroenterology;;  . RADIOLOGY WITH ANESTHESIA N/A 02/08/2015   Procedure: RADIOLOGY WITH ANESTHESIA;  Surgeon: Luanne Bras, MD;  Location: [facility];  Service: Radiology;  Laterality: N/A;  . TEE WITHOUT CARDIOVERSION N/A 08/29/2014   Procedure: TRANSESOPHAGEAL ECHOCARDIOGRAM (TEE);  Surgeon: Josue Hector, MD;  Location: [facility]  ENDOSCOPY;  Service: Cardiovascular;  Laterality: N/A;  . TOTAL KNEE ARTHROPLASTY         Family History  Problem Relation Age of Onset  . Heart attack Mother   . Heart failure Mother   . Cirrhosis Father   . Heart failure Brother   . Cancer Brother   . Hypertension Neg Hx        UNKNOWN  . Stroke Neg Hx        UNKNOWN    Social History   Tobacco Use  . Smoking status: Current Every Day Smoker    Packs/day: 0.50    Years: 50.00    Pack years: 25.00    Types: Cigars, Cigarettes    Start date: 02/09/1961  . Smokeless tobacco: Former Systems developer    Types: Chew  . Tobacco comment: 2 ppd, full flavor  Substance Use Topics  . Alcohol use: Yes    Comment: occasionally, "I might drink 1 every 6 months"  . Drug use: Yes    Types: Cocaine    Comment: "I smoke crack every once in a while"; last smoked yesterday    Home Medications Prior to Admission medications   Medication Sig Start Date End Date Taking? Authorizing Provider  magnesium citrate SOLN Take 296 mLs (1 Bottle total) by mouth daily as needed for severe constipation. 05/29/20  Yes Kristianna Saperstein, Corene Cornea, MD  polyethylene glycol (MIRALAX / GLYCOLAX) 17 g packet Take 17 g by mouth daily. 05/29/20  Yes Soma Lizak, Corene Cornea, MD  acetaminophen (TYLENOL) 500 MG tablet Take 2 tablets (1,000 mg total) by mouth every 8 (eight) hours. 05/09/20 06/08/20  Gaylan Gerold, DO  aspirin EC 81 MG tablet Take 1 tablet (81 mg total) by mouth daily. Swallow whole. 05/02/20 05/02/21  Gaylan Gerold, DO  atorvastatin (LIPITOR) 80 MG tablet Take 1 tablet (80 mg total) by mouth daily at 6 PM. 05/02/20   Gaylan Gerold, DO  cephALEXin (KEFLEX) 500 MG capsule TAKE 1 CAPSULE (500 MG TOTAL) BY MOUTH FOUR TIMES DAILY FOR 3 DAYS. 04/18/20 04/18/21  Marianna Payment, MD  Chlorhexidine Gluconate Cloth 2 % PADS APPLY 6 EACH TOPICALLY DAILY. 04/18/20 04/18/21  Marianna Payment, MD  Clobetasol Propionate 0.05 % shampoo Apply 1 application topically 2 (two) times a week. Tuesday and Friday to scalp  and beard for eczema 10/29/19   [provider]  clopidogrel (PLAVIX) 75 MG tablet TAKE 1 TABLET (75 MG TOTAL) BY MOUTH DAILY. 04/18/20 04/18/21  Marianna Payment, MD  DULoxetine (CYMBALTA) 20 MG capsule Take 2 capsules (40 mg total) by mouth at bedtime. 04/18/20 05/18/20  Marianna Payment, MD  DULoxetine (CYMBALTA)  20 MG capsule TAKE 2 CAPSULES (40 MG TOTAL) BY MOUTH AT BEDTIME. 04/18/20 04/18/21  Marianna Payment, MD  furosemide (LASIX) 40 MG tablet Take 1 tablet (40 mg total) by mouth daily. 04/19/20 06/18/20  Marianna Payment, MD  furosemide (LASIX) 40 MG tablet TAKE 1 TABLET (40 MG TOTAL) BY MOUTH DAILY. 04/18/20 04/18/21  Marianna Payment, MD  ipratropium-albuterol (DUONEB) 0.5-2.5 (3) MG/3ML SOLN Inhale 3 mLs into the lungs every 6 (six) hours. 04/17/19   [provider]  iron polysaccharides (NU-IRON) 150 MG capsule Take 1 capsule (150 mg total) by mouth every other day. 05/03/20 10/30/20  Gaylan Gerold, DO  memantine (NAMENDA) 10 MG tablet Take 1 tablet (10 mg total) by mouth 2 (two) times daily. 05/02/20 04/27/21  Gaylan Gerold, DO  methocarbamol (ROBAXIN) 750 MG tablet TAKE 1 TABLET (750 MG TOTAL) BY MOUTH FOUR TIMES DAILY. 04/18/20 04/18/21  Marianna Payment, MD  mirtazapine (REMERON) 7.5 MG tablet Take 1 tablet (7.5 mg total) by mouth at bedtime. 04/18/20 05/18/20  Marianna Payment, MD  mirtazapine (REMERON) 7.5 MG tablet TAKE 1 TABLET (7.5 MG TOTAL) BY MOUTH AT BEDTIME. 04/18/20 04/18/21  Marianna Payment, MD  oxyCODONE (OXY IR/ROXICODONE) 5 MG immediate release tablet TAKE 1 TABLET (5 MG TOTAL) BY MOUTH EVERY FOUR HOURS AS NEEDED FOR SEVERE PAIN. 04/18/20 10/15/20  Marianna Payment, MD  pantoprazole (PROTONIX) 20 MG tablet Take 1 tablet (20 mg total) by mouth daily. 04/12/20   Garald Balding, PA-C  pantoprazole (PROTONIX) 20 MG tablet TAKE 1 TABLET BY MOUTH (20MG  TOTAL) ONCE DAILY 04/18/20 04/18/21  Sharyn Lull A, PA-C  potassium chloride SA (KLOR-CON) 20 MEQ tablet Take 1 tablet (20 mEq total) by mouth daily. 04/18/20 05/18/20   Marianna Payment, MD  potassium chloride SA (KLOR-CON) 20 MEQ tablet TAKE 1 TABLET (20 MEQ TOTAL) BY MOUTH DAILY. 04/18/20 04/18/21  Marianna Payment, MD  senna-docusate (SENOKOT-S) 8.6-50 MG tablet TAKE 2 TABLETS BY MOUTH AT BEDTIME. 04/18/20 04/18/21  Marianna Payment, MD  sodium hypochlorite (DAKIN'S 1/2 STRENGTH) external solution Irrigate with 1 application as directed See admin instructions. Apply to sacral pressure wound bid 04/18/20   Marianna Payment, MD  sodium hypochlorite (DAKIN'S 1/2 STRENGTH) external solution IRRIGATE WITH 1 APPLICATION AS DIRECTED SEE ADMIN INSTRUCTIONS. APPLY TO SACRAL PRESSURE WOUND BID 04/18/20 04/18/21  Marianna Payment, MD  sulfamethoxazole-trimethoprim (BACTRIM DS) 800-160 MG tablet Take 1 tablet by mouth 2 (two) times daily for 7 days. 05/22/20 05/29/20  Mosetta Anis, MD  triamcinolone (KENALOG) 0.1 % Apply 1 application topically every 12 (twelve) hours. Legs, itching 02/14/20   [provider]  Grant Ruts INHUB 250-50 MCG/DOSE AEPB Inhale 1-2 puffs into the lungs daily. 04/18/20   Marianna Payment, MD  zinc oxide 20 % ointment Apply 1 application topically 2 (two) times daily. Day and night shift to diaper area 04/18/20   Jeralyn Bennett, MD  zinc oxide 20 % ointment APPLY 1 APPLICATION TOPICALLY 2 (TWO) TIMES DAILY. DAY AND NIGHT SHIFT TO DIAPER AREA 04/18/20 04/18/21  Jeralyn Bennett, MD  zinc oxide 20 % ointment APPLY 1 APPLICATION TOPICALLY TWO TIMES DAILY. 04/18/20 04/18/21  Marianna Payment, MD    Allergies    Darvocet [propoxyphene n-acetaminophen], Haldol [haloperidol decanoate], Acetaminophen, Metformin and related, and Norco [hydrocodone-acetaminophen]  Review of Systems   Review of Systems  Gastrointestinal: Positive for abdominal pain.  All other systems reviewed and are negative.   Physical Exam Updated Vital Signs BP (!) 150/71   Pulse (!) 104   Temp 98.4 F (  36.9 C) (Oral)   Resp (!) 28   SpO2 96%   Physical Exam Vitals and nursing note reviewed.   Constitutional:      Appearance: He is well-developed.  HENT:     Head: Normocephalic and atraumatic.     Mouth/Throat:     Mouth: Mucous membranes are moist.     Pharynx: Oropharynx is clear.  Eyes:     Pupils: Pupils are equal, round, and reactive to light.  Cardiovascular:     Rate and Rhythm: Tachycardia present.  Pulmonary:     Effort: Pulmonary effort is normal. No respiratory distress.  Abdominal:     General: There is no distension.     Tenderness: There is generalized abdominal tenderness.  Musculoskeletal:        General: Normal range of motion.     Cervical back: Normal range of motion.  Skin:    General: Skin is warm and dry.  Neurological:     General: No focal deficit present.     Mental Status: He is alert.  Psychiatric:        Mood and Affect: Mood normal.     ED Results / Procedures / Treatments   Labs (all labs ordered are listed, but only abnormal results are displayed) Labs Reviewed  CBC WITH DIFFERENTIAL/PLATELET - Abnormal; Notable for the following components:      Result Value   Hemoglobin 12.9 (*)    MCHC 29.5 (*)    All other components within normal limits  COMPREHENSIVE METABOLIC PANEL - Abnormal; Notable for the following components:   Glucose, Bld 124 (*)    Albumin 3.3 (*)    All other components within normal limits  CBC WITH DIFFERENTIAL/PLATELET - Abnormal; Notable for the following components:   Hemoglobin 12.1 (*)    All other components within normal limits  LIPASE, BLOOD  URINALYSIS, ROUTINE W REFLEX MICROSCOPIC  PROTIME-INR  LACTIC ACID, PLASMA  LACTIC ACID, PLASMA    EKG None  Radiology CT ABDOMEN PELVIS W CONTRAST  Result Date: 05/29/2020 CLINICAL DATA:  Abdominal distension, left lower quadrant EXAM: CT ABDOMEN AND PELVIS WITH CONTRAST TECHNIQUE: Multidetector CT imaging of the abdomen and pelvis was performed using the standard protocol following bolus administration of intravenous contrast. CONTRAST:  144mL  OMNIPAQUE IOHEXOL 300 MG/ML  SOLN COMPARISON:  04/12/2020 FINDINGS: Lower chest: bibasilar atelectasis. Trace left pleural effusion. Small hiatal hernia. Hepatobiliary: No focal liver abnormality is seen. Status post cholecystectomy. No biliary dilatation. Pancreas: No focal abnormality or ductal dilatation. Spleen: No focal abnormality.  Normal size. Adrenals/Urinary Tract: Renovascular calcifications bilaterally. No renal or ureteral stones. No hydronephrosis. Adrenal glands and urinary bladder unremarkable. Foley catheter in place. Stomach/Bowel: Normal appendix. Moderate stool burden and gas throughout the colon. Vascular/Lymphatic: Heavily calcified aorta and iliac vessels. Prior aorto bi femoral bypass. No adenopathy. Reproductive: No visible focal abnormality. Other: No free fluid or free air. Musculoskeletal: No acute bony abnormality. IMPRESSION: No acute findings in the abdomen or pelvis. Moderate stool burden throughout the colon. Prior aortobifemoral bypass. Small hiatal hernia. Electronically Signed   By: Rolm Baptise M.D.   On: 05/29/2020 02:47    Procedures Procedures   Medications Ordered in ED Medications  fentaNYL (SUBLIMAZE) injection 50 mcg (50 mcg Intravenous Given 05/29/20 0248)  iohexol (OMNIPAQUE) 300 MG/ML solution 100 mL (100 mLs Intravenous Contrast Given 05/29/20 0236)    ED Course  I have reviewed the triage vital signs and the nursing notes.  Pertinent labs & imaging results  that were available during my care of the patient were reviewed by me and considered in my medical decision making (see chart for details).    MDM Rules/Calculators/A&P                          Work-up as above.  No evidence of bowel obstruction, hernia, diverticulitis or perforated ulcer.  No other evidence of acute abdominal emergencies.  Suspect may be gas and constipation or causing his distention and discomfort.  Will treat with MiraLAX and magnesium at home.  No indication for admission or  further work-up in the hospital this time.  Final Clinical Impression(s) / ED Diagnoses Final diagnoses:  Abdominal distension  Constipation, unspecified constipation type    Rx / DC Orders ED Discharge Orders         Ordered    polyethylene glycol (MIRALAX / GLYCOLAX) 17 g packet  Daily        05/29/20 0336    magnesium citrate SOLN  Daily PRN        05/29/20 0336           Khianna Blazina, Corene Cornea, MD 05/29/20 [phone]

## 2020-05-29 NOTE — ED Notes (Signed)
PTAR called  

## 2020-05-29 NOTE — Telephone Encounter (Signed)
Received TC from [facility], [name], who states patient was taken to ED yesterday with SOB and abdominal swelling, he is back home now.  [name] states she is "taking care of the patient at home and doing the best she can, but it is very difficult.  She also expresses frustration because she states she received a call today from [facility] and was informed this service would not be renewed.  She would like to pass this information over to the MD".  EC also states it is difficult to provide wound care to patient, although, she is managing.  RN encouraged EC to ask [facility] nurse at tomorrow's visit for return demonstration on helpful wound care positioning/cleanings, etc.  EC also states she received a call from [facility], who is starting to look for facility placement.  This RN will forward message to [facility] CCM, Marcie Bal and ask if she can reach out to [facility] again about above concerns. Thank you, SChaplin, RN,BSN

## 2020-05-29 NOTE — ED Notes (Signed)
Discharge instructions reviewed and explained, pt verbalized understanding. Pt left via PTAR caox4 in no obvious distress.

## 2020-05-29 NOTE — Progress Notes (Signed)
BSW started working on [employer].  [name], BSW, [place]

## 2020-05-30 DIAGNOSIS — Z466 Encounter for fitting and adjustment of urinary device: Secondary | ICD-10-CM | POA: Diagnosis not present

## 2020-05-30 DIAGNOSIS — I110 Hypertensive heart disease with heart failure: Secondary | ICD-10-CM | POA: Diagnosis not present

## 2020-05-30 DIAGNOSIS — Z7984 Long term (current) use of oral hypoglycemic drugs: Secondary | ICD-10-CM | POA: Diagnosis not present

## 2020-05-30 DIAGNOSIS — J449 Chronic obstructive pulmonary disease, unspecified: Secondary | ICD-10-CM | POA: Diagnosis not present

## 2020-05-30 DIAGNOSIS — N3000 Acute cystitis without hematuria: Secondary | ICD-10-CM | POA: Diagnosis not present

## 2020-05-30 DIAGNOSIS — Z8673 Personal history of transient ischemic attack (TIA), and cerebral infarction without residual deficits: Secondary | ICD-10-CM | POA: Diagnosis not present

## 2020-05-30 DIAGNOSIS — M4620 Osteomyelitis of vertebra, site unspecified: Secondary | ICD-10-CM | POA: Diagnosis not present

## 2020-05-30 DIAGNOSIS — E1169 Type 2 diabetes mellitus with other specified complication: Secondary | ICD-10-CM | POA: Diagnosis not present

## 2020-05-30 DIAGNOSIS — I5030 Unspecified diastolic (congestive) heart failure: Secondary | ICD-10-CM | POA: Diagnosis not present

## 2020-05-30 DIAGNOSIS — K922 Gastrointestinal hemorrhage, unspecified: Secondary | ICD-10-CM | POA: Diagnosis not present

## 2020-05-30 DIAGNOSIS — I951 Orthostatic hypotension: Secondary | ICD-10-CM | POA: Diagnosis not present

## 2020-05-30 DIAGNOSIS — K5900 Constipation, unspecified: Secondary | ICD-10-CM | POA: Diagnosis not present

## 2020-05-30 DIAGNOSIS — Z7902 Long term (current) use of antithrombotics/antiplatelets: Secondary | ICD-10-CM | POA: Diagnosis not present

## 2020-05-30 DIAGNOSIS — R339 Retention of urine, unspecified: Secondary | ICD-10-CM | POA: Diagnosis not present

## 2020-05-30 DIAGNOSIS — L89154 Pressure ulcer of sacral region, stage 4: Secondary | ICD-10-CM | POA: Diagnosis not present

## 2020-05-30 DIAGNOSIS — I69891 Dysphagia following other cerebrovascular disease: Secondary | ICD-10-CM | POA: Diagnosis not present

## 2020-05-30 DIAGNOSIS — L89159 Pressure ulcer of sacral region, unspecified stage: Secondary | ICD-10-CM | POA: Diagnosis not present

## 2020-05-31 ENCOUNTER — Ambulatory Visit: Payer: Self-pay

## 2020-05-31 NOTE — Chronic Care Management (AMB) (Signed)
On 05/30/20 BSW completed top half FL2 and faxed to [facility] clinic for patient's pcp to complete and sign.  [name], BSW, [place]  High Risk Managed Medicaid Team

## 2020-06-03 ENCOUNTER — Encounter (HOSPITAL_BASED_OUTPATIENT_CLINIC_OR_DEPARTMENT_OTHER): Payer: Medicare Other | Attending: Internal Medicine | Admitting: Internal Medicine

## 2020-06-03 ENCOUNTER — Other Ambulatory Visit: Payer: Self-pay

## 2020-06-03 DIAGNOSIS — Z7401 Bed confinement status: Secondary | ICD-10-CM | POA: Diagnosis not present

## 2020-06-03 DIAGNOSIS — M8660 Other chronic osteomyelitis, unspecified site: Secondary | ICD-10-CM

## 2020-06-03 DIAGNOSIS — Z87891 Personal history of nicotine dependence: Secondary | ICD-10-CM | POA: Insufficient documentation

## 2020-06-03 DIAGNOSIS — M2550 Pain in unspecified joint: Secondary | ICD-10-CM | POA: Diagnosis not present

## 2020-06-03 DIAGNOSIS — E119 Type 2 diabetes mellitus without complications: Secondary | ICD-10-CM

## 2020-06-03 DIAGNOSIS — M8668 Other chronic osteomyelitis, other site: Secondary | ICD-10-CM | POA: Diagnosis not present

## 2020-06-03 DIAGNOSIS — L89153 Pressure ulcer of sacral region, stage 3: Secondary | ICD-10-CM | POA: Diagnosis not present

## 2020-06-03 DIAGNOSIS — Z8673 Personal history of transient ischemic attack (TIA), and cerebral infarction without residual deficits: Secondary | ICD-10-CM | POA: Diagnosis not present

## 2020-06-03 DIAGNOSIS — E1169 Type 2 diabetes mellitus with other specified complication: Secondary | ICD-10-CM | POA: Insufficient documentation

## 2020-06-03 DIAGNOSIS — I639 Cerebral infarction, unspecified: Secondary | ICD-10-CM | POA: Diagnosis not present

## 2020-06-03 DIAGNOSIS — R0902 Hypoxemia: Secondary | ICD-10-CM | POA: Diagnosis not present

## 2020-06-04 ENCOUNTER — Ambulatory Visit: Payer: Medicare Other | Admitting: *Deleted

## 2020-06-04 ENCOUNTER — Other Ambulatory Visit: Payer: Self-pay | Admitting: Internal Medicine

## 2020-06-04 DIAGNOSIS — E7800 Pure hypercholesterolemia, unspecified: Secondary | ICD-10-CM

## 2020-06-04 DIAGNOSIS — I5030 Unspecified diastolic (congestive) heart failure: Secondary | ICD-10-CM | POA: Diagnosis not present

## 2020-06-04 DIAGNOSIS — E1169 Type 2 diabetes mellitus with other specified complication: Secondary | ICD-10-CM | POA: Diagnosis not present

## 2020-06-04 DIAGNOSIS — I110 Hypertensive heart disease with heart failure: Secondary | ICD-10-CM | POA: Diagnosis not present

## 2020-06-04 DIAGNOSIS — Z8673 Personal history of transient ischemic attack (TIA), and cerebral infarction without residual deficits: Secondary | ICD-10-CM | POA: Diagnosis not present

## 2020-06-04 DIAGNOSIS — J449 Chronic obstructive pulmonary disease, unspecified: Secondary | ICD-10-CM | POA: Diagnosis not present

## 2020-06-04 DIAGNOSIS — Z466 Encounter for fitting and adjustment of urinary device: Secondary | ICD-10-CM | POA: Diagnosis not present

## 2020-06-04 DIAGNOSIS — I69891 Dysphagia following other cerebrovascular disease: Secondary | ICD-10-CM | POA: Diagnosis not present

## 2020-06-04 DIAGNOSIS — L89154 Pressure ulcer of sacral region, stage 4: Secondary | ICD-10-CM | POA: Diagnosis not present

## 2020-06-04 DIAGNOSIS — I951 Orthostatic hypotension: Secondary | ICD-10-CM | POA: Diagnosis not present

## 2020-06-04 DIAGNOSIS — K922 Gastrointestinal hemorrhage, unspecified: Secondary | ICD-10-CM | POA: Diagnosis not present

## 2020-06-04 DIAGNOSIS — I5032 Chronic diastolic (congestive) heart failure: Secondary | ICD-10-CM

## 2020-06-04 DIAGNOSIS — L89159 Pressure ulcer of sacral region, unspecified stage: Secondary | ICD-10-CM | POA: Diagnosis not present

## 2020-06-04 DIAGNOSIS — K5900 Constipation, unspecified: Secondary | ICD-10-CM | POA: Diagnosis not present

## 2020-06-04 DIAGNOSIS — Z7984 Long term (current) use of oral hypoglycemic drugs: Secondary | ICD-10-CM | POA: Diagnosis not present

## 2020-06-04 DIAGNOSIS — N3000 Acute cystitis without hematuria: Secondary | ICD-10-CM | POA: Diagnosis not present

## 2020-06-04 DIAGNOSIS — I639 Cerebral infarction, unspecified: Secondary | ICD-10-CM

## 2020-06-04 DIAGNOSIS — F0280 Dementia in other diseases classified elsewhere without behavioral disturbance: Secondary | ICD-10-CM

## 2020-06-04 DIAGNOSIS — Z7902 Long term (current) use of antithrombotics/antiplatelets: Secondary | ICD-10-CM | POA: Diagnosis not present

## 2020-06-04 DIAGNOSIS — R339 Retention of urine, unspecified: Secondary | ICD-10-CM | POA: Diagnosis not present

## 2020-06-04 DIAGNOSIS — M4620 Osteomyelitis of vertebra, site unspecified: Secondary | ICD-10-CM | POA: Diagnosis not present

## 2020-06-04 NOTE — Chronic Care Management (AMB) (Signed)
  Care Management   Note  06/04/2020 Name: [name] MRN: [id number] DOB: 1950/08/19  [name] is enrolled in a Managed Medicaid plan: No.  FL2 form retrieved from this CCM RN's mailbox and placed in yellow team's box for completion by provider with instructions to return to this CCM BSW's mailbox when completed.    The care management team will reach out to the patient again over the next 30 days.   Kelli Churn RN, CCM, [facility] RN Care Manager [phone]

## 2020-06-06 DIAGNOSIS — K5900 Constipation, unspecified: Secondary | ICD-10-CM | POA: Diagnosis not present

## 2020-06-06 DIAGNOSIS — I110 Hypertensive heart disease with heart failure: Secondary | ICD-10-CM | POA: Diagnosis not present

## 2020-06-06 DIAGNOSIS — E1169 Type 2 diabetes mellitus with other specified complication: Secondary | ICD-10-CM | POA: Diagnosis not present

## 2020-06-06 DIAGNOSIS — I69891 Dysphagia following other cerebrovascular disease: Secondary | ICD-10-CM | POA: Diagnosis not present

## 2020-06-06 DIAGNOSIS — N3000 Acute cystitis without hematuria: Secondary | ICD-10-CM | POA: Diagnosis not present

## 2020-06-06 DIAGNOSIS — Z7902 Long term (current) use of antithrombotics/antiplatelets: Secondary | ICD-10-CM | POA: Diagnosis not present

## 2020-06-06 DIAGNOSIS — L89154 Pressure ulcer of sacral region, stage 4: Secondary | ICD-10-CM | POA: Diagnosis not present

## 2020-06-06 DIAGNOSIS — K922 Gastrointestinal hemorrhage, unspecified: Secondary | ICD-10-CM | POA: Diagnosis not present

## 2020-06-06 DIAGNOSIS — R339 Retention of urine, unspecified: Secondary | ICD-10-CM | POA: Diagnosis not present

## 2020-06-06 DIAGNOSIS — I5030 Unspecified diastolic (congestive) heart failure: Secondary | ICD-10-CM | POA: Diagnosis not present

## 2020-06-06 DIAGNOSIS — M4620 Osteomyelitis of vertebra, site unspecified: Secondary | ICD-10-CM | POA: Diagnosis not present

## 2020-06-06 DIAGNOSIS — Z8673 Personal history of transient ischemic attack (TIA), and cerebral infarction without residual deficits: Secondary | ICD-10-CM | POA: Diagnosis not present

## 2020-06-06 DIAGNOSIS — Z7984 Long term (current) use of oral hypoglycemic drugs: Secondary | ICD-10-CM | POA: Diagnosis not present

## 2020-06-06 DIAGNOSIS — Z466 Encounter for fitting and adjustment of urinary device: Secondary | ICD-10-CM | POA: Diagnosis not present

## 2020-06-06 DIAGNOSIS — I951 Orthostatic hypotension: Secondary | ICD-10-CM | POA: Diagnosis not present

## 2020-06-06 DIAGNOSIS — J449 Chronic obstructive pulmonary disease, unspecified: Secondary | ICD-10-CM | POA: Diagnosis not present

## 2020-06-06 DIAGNOSIS — L89159 Pressure ulcer of sacral region, unspecified stage: Secondary | ICD-10-CM | POA: Diagnosis not present

## 2020-06-07 ENCOUNTER — Telehealth: Payer: Self-pay

## 2020-06-07 NOTE — Telephone Encounter (Signed)
Thank you for the update!

## 2020-06-07 NOTE — Telephone Encounter (Signed)
I called [name] Glad to find out what kind of visit; stated she's speech therapy. She has re-scheduled pt for Tuesdays and Thursdays.

## 2020-06-07 NOTE — Telephone Encounter (Signed)
[name] Glad wanted to say physician know she will missed a visit with pt today, pt has dialysis on Fridays [phone]

## 2020-06-09 DIAGNOSIS — L8990 Pressure ulcer of unspecified site, unspecified stage: Secondary | ICD-10-CM | POA: Diagnosis not present

## 2020-06-09 DIAGNOSIS — M4647 Discitis, unspecified, lumbosacral region: Secondary | ICD-10-CM | POA: Diagnosis not present

## 2020-06-09 DIAGNOSIS — I70229 Atherosclerosis of native arteries of extremities with rest pain, unspecified extremity: Secondary | ICD-10-CM | POA: Diagnosis not present

## 2020-06-09 DIAGNOSIS — G8929 Other chronic pain: Secondary | ICD-10-CM | POA: Diagnosis not present

## 2020-06-09 DIAGNOSIS — S31000A Unspecified open wound of lower back and pelvis without penetration into retroperitoneum, initial encounter: Secondary | ICD-10-CM | POA: Diagnosis not present

## 2020-06-10 ENCOUNTER — Ambulatory Visit: Payer: Medicare Other | Admitting: *Deleted

## 2020-06-10 ENCOUNTER — Encounter (HOSPITAL_BASED_OUTPATIENT_CLINIC_OR_DEPARTMENT_OTHER): Payer: Medicare Other | Admitting: Internal Medicine

## 2020-06-10 ENCOUNTER — Telehealth: Payer: Self-pay

## 2020-06-10 ENCOUNTER — Ambulatory Visit: Payer: Self-pay

## 2020-06-10 DIAGNOSIS — L89159 Pressure ulcer of sacral region, unspecified stage: Secondary | ICD-10-CM

## 2020-06-10 DIAGNOSIS — F0280 Dementia in other diseases classified elsewhere without behavioral disturbance: Secondary | ICD-10-CM

## 2020-06-10 DIAGNOSIS — I639 Cerebral infarction, unspecified: Secondary | ICD-10-CM

## 2020-06-10 DIAGNOSIS — E7800 Pure hypercholesterolemia, unspecified: Secondary | ICD-10-CM

## 2020-06-10 DIAGNOSIS — I5032 Chronic diastolic (congestive) heart failure: Secondary | ICD-10-CM

## 2020-06-10 NOTE — Telephone Encounter (Signed)
Return call to pt's POA, [name] - states pt is total care and needs SNF. Requesting paperwork to be completed as soon as possible. I will ask Kelli Churn CCM to f/u on FL-2. Thanks

## 2020-06-10 NOTE — Chronic Care Management (AMB) (Signed)
BSW contacted POA and informed that patient's fl2 has been completed and sent to DSS.  [name], BSW, [facility]

## 2020-06-10 NOTE — Telephone Encounter (Signed)
Pls contact pt nurse [phone]

## 2020-06-10 NOTE — Chronic Care Management (AMB) (Addendum)
  Care Management   Note  06/10/2020 Name: [name] MRN: [id number] DOB: [birth year]  [name] is enrolled in a Managed Medicaid plan: No. Outreach attempt today was successful.   Dr. Truman Hayward placed completed FL2 on this CCM RN's desk and was found by this CCM RN upon return to work today.   Per request of [name] BSW , provider completed FL2 successfully faxed to [name] at [facility] at fax # [phone].  The care management team will reach out to the patient again over the next 30 days.   Kelli Churn RN, CCM, [facility] RN Care Manager [phone]

## 2020-06-11 ENCOUNTER — Ambulatory Visit: Payer: Medicare Other | Admitting: *Deleted

## 2020-06-11 DIAGNOSIS — I951 Orthostatic hypotension: Secondary | ICD-10-CM | POA: Diagnosis not present

## 2020-06-11 DIAGNOSIS — R339 Retention of urine, unspecified: Secondary | ICD-10-CM | POA: Diagnosis not present

## 2020-06-11 DIAGNOSIS — M4620 Osteomyelitis of vertebra, site unspecified: Secondary | ICD-10-CM | POA: Diagnosis not present

## 2020-06-11 DIAGNOSIS — E1169 Type 2 diabetes mellitus with other specified complication: Secondary | ICD-10-CM | POA: Diagnosis not present

## 2020-06-11 DIAGNOSIS — M2550 Pain in unspecified joint: Secondary | ICD-10-CM | POA: Diagnosis not present

## 2020-06-11 DIAGNOSIS — Z7984 Long term (current) use of oral hypoglycemic drugs: Secondary | ICD-10-CM | POA: Diagnosis not present

## 2020-06-11 DIAGNOSIS — I110 Hypertensive heart disease with heart failure: Secondary | ICD-10-CM | POA: Diagnosis not present

## 2020-06-11 DIAGNOSIS — R5381 Other malaise: Secondary | ICD-10-CM | POA: Diagnosis not present

## 2020-06-11 DIAGNOSIS — Z7401 Bed confinement status: Secondary | ICD-10-CM | POA: Diagnosis not present

## 2020-06-11 DIAGNOSIS — I5030 Unspecified diastolic (congestive) heart failure: Secondary | ICD-10-CM | POA: Diagnosis not present

## 2020-06-11 DIAGNOSIS — K922 Gastrointestinal hemorrhage, unspecified: Secondary | ICD-10-CM | POA: Diagnosis not present

## 2020-06-11 DIAGNOSIS — K5900 Constipation, unspecified: Secondary | ICD-10-CM | POA: Diagnosis not present

## 2020-06-11 DIAGNOSIS — I639 Cerebral infarction, unspecified: Secondary | ICD-10-CM

## 2020-06-11 DIAGNOSIS — I5032 Chronic diastolic (congestive) heart failure: Secondary | ICD-10-CM

## 2020-06-11 DIAGNOSIS — J449 Chronic obstructive pulmonary disease, unspecified: Secondary | ICD-10-CM | POA: Diagnosis not present

## 2020-06-11 DIAGNOSIS — Z466 Encounter for fitting and adjustment of urinary device: Secondary | ICD-10-CM | POA: Diagnosis not present

## 2020-06-11 DIAGNOSIS — L89159 Pressure ulcer of sacral region, unspecified stage: Secondary | ICD-10-CM

## 2020-06-11 DIAGNOSIS — L89154 Pressure ulcer of sacral region, stage 4: Secondary | ICD-10-CM | POA: Diagnosis not present

## 2020-06-11 DIAGNOSIS — R338 Other retention of urine: Secondary | ICD-10-CM | POA: Diagnosis not present

## 2020-06-11 DIAGNOSIS — Z8673 Personal history of transient ischemic attack (TIA), and cerebral infarction without residual deficits: Secondary | ICD-10-CM | POA: Diagnosis not present

## 2020-06-11 DIAGNOSIS — I69391 Dysphagia following cerebral infarction: Secondary | ICD-10-CM | POA: Diagnosis not present

## 2020-06-11 DIAGNOSIS — Z7902 Long term (current) use of antithrombotics/antiplatelets: Secondary | ICD-10-CM | POA: Diagnosis not present

## 2020-06-11 DIAGNOSIS — Z87440 Personal history of urinary (tract) infections: Secondary | ICD-10-CM | POA: Diagnosis not present

## 2020-06-11 NOTE — Chronic Care Management (AMB) (Signed)
  Care Management   Note  06/11/2020 Name: [name] MRN: [id number] DOB: March 19, 1950  [name] is enrolled in a Managed Medicaid plan: No. Outreach attempt today was successful.   Signed orders and completed certificate of medical necessity  for adult incontinence supplies successfully  faxed to [employer] at [phone] and to [employer] at [phone].  The care management team will reach out to the patient again over the next 30 days.   Kelli Churn RN, CCM, [facility] RN Care Manager [phone]

## 2020-06-12 ENCOUNTER — Ambulatory Visit: Payer: Medicare Other | Admitting: Licensed Clinical Social Worker

## 2020-06-12 ENCOUNTER — Other Ambulatory Visit: Payer: Self-pay

## 2020-06-12 NOTE — Chronic Care Management (AMB) (Signed)
  Care Management   Social Work Visit Note  06/12/2020 Name: [name] MRN: [id number] DOB: 1950/06/16  [name] is a 70 y.o. year old male who sees Kenneth Gerold, DO for primary care. The care management team was consulted for assistance with care management and care coordination needs related to [name] referrals.   Patient was given the following information about care management and care coordination services today, agreed to services, and gave verbal consent: 1.care management/care coordination services include personalized support from designated clinical staff supervised by their physician, including individualized plan of care and coordination with other care providers 2. 24/7 contact phone numbers for assistance for urgent and routine care needs. 3. The patient may stop care management/care coordination services at any time by phone call to the office staff.  Engaged with patient by telephone for follow up visit in response to provider referral for social work chronic care management and care coordination services.  Assessment: Review of patient history, allergies, and health status during evaluation of patient need for care management/care coordination services.    Interventions:  . Patient interviewed and appropriate assessments performed . Collaborated with clinical team regarding patient needs  . Caregiver advised DSS visited the home on 06/11/2020. DSS will send referrals for ALF agencies and upadate patient when appropriate. SW advised caregiver to follow up with DSS on 06/17/2020.   SDOH (Social Determinants of Health) assessments performed: No     Plan:  . patient will work with BSW to address needs related to Level of care concerns . SW will follow up with the patient in the next 30 days. SW will follow up with DSS in the next 30 days.   [name], [name]  Social Worker [facility] Care Management   [phone]

## 2020-06-12 NOTE — Patient Instructions (Signed)
Visit Information  Instructions: Patient will follow up with DSS as directed by SW.  Patient was given the following information about care management and care coordination services today, agreed to services, and gave verbal consent: 1.care management/care coordination services include personalized support from designated clinical staff supervised by their physician, including individualized plan of care and coordination with other care providers 2. 24/7 contact phone numbers for assistance for urgent and routine care needs. 3. The patient may stop care management/care coordination services at any time by phone call to the office staff.  Patient verbalizes understanding of instructions provided today and agrees to view in [place].   Telephone follow up appointment with care management team member scheduled for: July 13, 2020  Milus Height, [place]  Social Worker [facility] Management  [phone]

## 2020-06-13 ENCOUNTER — Ambulatory Visit: Payer: Medicare Other | Admitting: Licensed Clinical Social Worker

## 2020-06-13 ENCOUNTER — Other Ambulatory Visit: Payer: Self-pay | Admitting: Licensed Clinical Social Worker

## 2020-06-13 NOTE — Chronic Care Management (AMB) (Signed)
  Care Management   Social Work Visit Note  06/13/2020 Name: [name] MRN: [id number] DOB: 07-Jun-1950  [name] is a 70 y.o. year old male who sees Kenneth Gerold, DO for primary care. The care management team was consulted for assistance with care management and care coordination needs related to community resources   Patient was given the following information about care management and care coordination services today, agreed to services, and gave verbal consent: 1.care management/care coordination services include personalized support from designated clinical staff supervised by their physician, including individualized plan of care and coordination with other care providers 2. 24/7 contact phone numbers for assistance for urgent and routine care needs. 3. The patient may stop care management/care coordination services at any time by phone call to the office staff.  Engaged with patient by telephone for follow up visit in response to provider referral for social work chronic care management and care coordination services.  Assessment: Review of patient history, allergies, and health status during evaluation of patient need for care management/care coordination services.    Interventions:  . Collaborated with provider office/team re: FL2 application.  SDOH (Social Determinants of Health) assessments performed: No     Plan:  . Social Worker will collaborate with PCP to complete Fl2. SW will follow up with patient in the next couiple of days. [name], [place]  Social Worker [facility]  [phone]

## 2020-06-13 NOTE — Patient Instructions (Signed)
Visit Information  Instructions:   Patient was given the following information about care management and care coordination services today, agreed to services, and gave verbal consent: 1.care management/care coordination services include personalized support from designated clinical staff supervised by their physician, including individualized plan of care and coordination with other care providers 2. 24/7 contact phone numbers for assistance for urgent and routine care needs. 3. The patient may stop care management/care coordination services at any time by phone call to the office staff.  Patient verbalizes understanding of instructions provided today and agrees to view in [place].   The care management team will reach out to the patient again over the next couple of days.   Milus Height, [place]  Social Worker [facility]  [phone]

## 2020-06-14 ENCOUNTER — Ambulatory Visit: Payer: Medicare Other | Admitting: Licensed Clinical Social Worker

## 2020-06-14 ENCOUNTER — Other Ambulatory Visit: Payer: Self-pay

## 2020-06-14 NOTE — Progress Notes (Signed)
[name] ([id number]) Visit Report for 06/03/2020 Abuse/Suicide Risk Screen Details Patient Name: Date of Service: [name], Kenneth NA Kenneth L. 06/03/2020 9:00 A M Medical Record Number: [id number] Patient Account Number: [id number] Date of Birth/Sex: Treating RN: Jul 07, 1950 (70 y.o. Male) Rhae Hammock Primary Care [name]: [name] UA N Other Clinician: Referring [name]: Treating [name] Weeks in Treatment: 0 Abuse/Suicide Risk Screen Items Answer ABUSE RISK SCREEN: Has anyone close to you tried to hurt or harm you recentlyo No Do you feel uncomfortable with anyone in your familyo No Has anyone forced you do things that you didnt want to doo No Electronic Signature(s) Signed: 06/14/2020 3:14:58 PM By: Rhae Hammock RN Entered By: Rhae Hammock on 06/03/2020 09:35:55 -------------------------------------------------------------------------------- Activities of Daily Living Details Patient Name: Date of Service: [name], Kenneth NA Kenneth L. 06/03/2020 9:00 A M Medical Record Number: [id number] Patient Account Number: [id number] Date of Birth/Sex: Treating RN: November 29, 1950 (70 y.o. Male) Rhae Hammock Primary Care [name]: [name] UA N Other Clinician: Referring [name]: Treating [name]/Extender: [name] in Treatment: 0 Activities of Daily Living Items Answer Activities of Daily Living (Please select one for each item) Drive Automobile Not Able T Medications ake Need Assistance Use T elephone Need Assistance Care for Appearance Need Assistance Use T oilet Need Assistance Bath / Shower Need Assistance Dress Self Need Assistance Feed Self Need Assistance Walk Not Able Get In / Out Bed Need Assistance Housework Need Assistance Prepare Meals Need Assistance Handle Money Need Assistance Shop for Self Need Assistance Electronic Signature(s) Signed: 06/14/2020 3:14:58 PM By: Rhae Hammock  RN Entered By: Rhae Hammock on 06/03/2020 09:36:26 -------------------------------------------------------------------------------- Education Screening Details Patient Name: Date of Service: [name], RO NA Kenneth L. 06/03/2020 9:00 A M Medical Record Number: [id number] Patient Account Number: [id number] Date of Birth/Sex: Treating RN: 1950-02-11 (70 y.o. Male) Rhae Hammock Primary Care [name]: [name] UA N Other Clinician: Referring [name]: Treating [name]/Extender: [name] in Treatment: 0 Primary Learner Assessed: Patient Learning Preferences/Education Level/Primary Language Learning Preference: Explanation, Demonstration, Communication Board, Printed Material Highest Education Level: High School Preferred Language: English Cognitive Barrier Language Barrier: No Translator Needed: No Memory Deficit: No Emotional Barrier: No Cultural/Religious Beliefs Affecting Medical Care: No Physical Barrier Impaired Vision: Yes Glasses Impaired Hearing: No Decreased Hand dexterity: No Knowledge/Comprehension Knowledge Level: High Comprehension Level: High Ability to understand written instructions: High Ability to understand verbal instructions: High Motivation Anxiety Level: Calm Cooperation: Cooperative Education Importance: Denies Need Interest in Health Problems: Asks Questions Perception: Coherent Willingness to Engage in Self-Management High Activities: Readiness to Engage in Self-Management High Activities: Electronic Signature(s) Signed: 06/14/2020 3:14:58 PM By: Rhae Hammock RN Entered By: Rhae Hammock on 06/03/2020 09:36:51 -------------------------------------------------------------------------------- Fall Risk Assessment Details Patient Name: Date of Service: [name], RO NA Kenneth L. 06/03/2020 9:00 A M Medical Record Number: [id number] Patient Account Number: [id number] Date of Birth/Sex: Treating RN: [birth year] (70  y.o. Male) Rhae Hammock Primary Care [name]: [name] UA N Other Clinician: Referring [name]: Treating [name]/Extender: [name] in Treatment: 0 Fall Risk Assessment Items Have you had 2 or more falls in the last 12 monthso 0 No Have you had any fall that resulted in injury in the last 12 monthso 0 No FALLS RISK SCREEN History of falling - immediate or within 3 months 0 No Secondary diagnosis (Do you have 2 or more medical diagnoseso) 0 No Ambulatory aid None/bed rest/wheelchair/nurse 0 No Crutches/cane/walker 0 No  Furniture 0 No Intravenous therapy Access/Saline/Heparin Lock 0 No Gait/Transferring Normal/ bed rest/ wheelchair 0 No Weak (short steps with or without shuffle, stooped but able to lift head while walking, may seek 0 No support from furniture) Impaired (short steps with shuffle, may have difficulty arising from chair, head down, impaired 0 No balance) Mental Status Oriented to own ability 0 No Electronic Signature(s) Signed: 06/14/2020 3:14:58 PM By: Rhae Hammock RN Entered By: Rhae Hammock on 06/03/2020 09:37:01 -------------------------------------------------------------------------------- Foot Assessment Details Patient Name: Date of Service: [name], RO NA Kenneth L. 06/03/2020 9:00 A M Medical Record Number: [id number] Patient Account Number: [id number] Date of Birth/Sex: Treating RN: [birth year] (70 y.o. Male) Rhae Hammock Primary Care [name]: [name] UA N Other Clinician: Referring [name]: Treating [name]/Extender: [name] in Treatment: 0 Foot Assessment Items Site Locations + = Sensation present, - = Sensation absent, C = Callus, U = Ulcer R = Redness, W = Warmth, M = Maceration, PU = Pre-ulcerative lesion F = Fissure, S = Swelling, D = Dryness Assessment Right: Left: Other Deformity: No No Prior Foot Ulcer: No No Prior Amputation: No No Charcot Joint: No  No Ambulatory Status: Gait: Notes no LE wounds; pt. not diabetic Electronic Signature(s) Signed: 06/14/2020 3:14:58 PM By: Rhae Hammock RN Entered By: Rhae Hammock on 06/03/2020 09:37:33 -------------------------------------------------------------------------------- Nutrition Risk Screening Details Patient Name: Date of Service: [name], RO NA Kenneth L. 06/03/2020 9:00 A M Medical Record Number: [id number] Patient Account Number: [id number] Date of Birth/Sex: Treating RN: April 30, 1950 (70 y.o. Male) Rhae Hammock Primary Care Abir Eroh: [name] UA N Other Clinician: Referring Teralyn Mullins: Treating Kaidyn Javid/Extender: [name] in Treatment: 0 Height (in): 72 Weight (lbs): 238 Body Mass Index (BMI): 32.3 Nutrition Risk Screening Items Score Screening NUTRITION RISK SCREEN: I have an illness or condition that made me change the kind and/or amount of food I eat 0 No I eat fewer than two meals per day 0 No I eat few fruits and vegetables, or milk products 0 No I have three or more drinks of beer, liquor or wine almost every day 0 No I have tooth or mouth problems that make it hard for me to eat 0 No I don't always have enough money to buy the food I need 0 No I eat alone most of the time 0 No I take three or more different prescribed or over-the-counter drugs a day 0 No Without wanting to, I have lost or gained 10 pounds in the last six months 0 No I am not always physically able to shop, cook and/or feed myself 0 No Nutrition Protocols Good Risk Protocol 0 No interventions needed Moderate Risk Protocol High Risk Proctocol Risk Level: Good Risk Score: 0 Electronic Signature(s) Signed: 06/14/2020 3:14:58 PM By: Rhae Hammock RN Entered By: Rhae Hammock on 06/03/2020 09:37:09

## 2020-06-14 NOTE — Progress Notes (Signed)
RIELEY, HAUSMAN ([id number]) Visit Report for 06/03/2020 Chief Complaint Document Details Patient Name: Date of Service: [name], [place] NA LD L. 06/03/2020 9:00 A M Medical Record Number: [id number] Patient Account Number: [id number] Date of Birth/Sex: Treating RN: [birth year] (70 y.o. Male) Baruch Gouty Primary Care Provider: Jonah Blue UA N Other Clinician: Referring Provider: Treating Provider/Extender: Pilar Jarvis in Treatment: 0 Information Obtained from: Patient Chief Complaint Sacral Ulcer Electronic Signature(s) Signed: 06/06/2020 6:22:56 PM By: Kalman Shan DO Entered By: Kalman Shan on 06/06/2020 17:58:29 -------------------------------------------------------------------------------- HPI Details Patient Name: Date of Service: [name], [name] 06/03/2020 9:00 A M Medical Record Number: [id number] Patient Account Number: [id number] Date of Birth/Sex: Treating RN: [birth year] (70 y.o. Male) Baruch Gouty Primary Care Provider: Jonah Blue UA N Other Clinician: Referring Provider: Treating Provider/Extender: Pilar Jarvis in Treatment: 0 History of Present Illness HPI Description: [name] is a 70 year old male with a past medical history of CVA, type 2 diabetes and chronic osteomyelitis that presents the clinic today for a sacral ulcer. Patient was hospitalized on 04/12/2020 for abdominal pain found to have a GI bleed. At that time he was noted to have a stage IV decubitus ulcer. He had a CT scan that showed chronic osteomyelitis of the sacrum. He has not had treatment for this. Today he states he feels well. He resides in a nursing facility and has difficulty with mobility. This has been an ongoing wound for the patient over several years. He denies any fever/chills, nausea/vomiting, purulent drainage or increased warmth or erythema to the wound. Electronic Signature(s) Signed: 06/06/2020 6:22:56 PM By: Kalman Shan DO Entered By: Kalman Shan on 06/06/2020 17:59:35 -------------------------------------------------------------------------------- Physical Exam Details Patient Name: Date of Service: [name], [name] 06/03/2020 9:00 A M Medical Record Number: [id number] Patient Account Number: [id number] Date of Birth/Sex: Treating RN: [birth year] (70 y.o. Male) Baruch Gouty Primary Care Provider: Other Clinician: Jonah Blue UA N Referring Provider: Treating Provider/Extender: Pilar Jarvis in Treatment: 0 Constitutional respirations regular, non-labored and within target range for patient.[name] Psychiatric pleasant and cooperative. Notes Sacrum: Overall the wound bed looks healthy. There is no necrotic tissue. There are no signs of infection. Surrounding skin is intact. Electronic Signature(s) Signed: 06/06/2020 6:22:56 PM By: Kalman Shan DO Entered By: Kalman Shan on 06/06/2020 18:00:22 -------------------------------------------------------------------------------- Physician Orders Details Patient Name: Date of Service: [name], [name] 06/03/2020 9:00 A M Medical Record Number: [id number] Patient Account Number: [id number] Date of Birth/Sex: Treating RN: [birth year] (70 y.o. Male) Baruch Gouty Primary Care Provider: Jonah Blue UA N Other Clinician: Referring Provider: Treating Provider/Extender: Pilar Jarvis in Treatment: 0 Verbal / Phone Orders: No Diagnosis Coding ICD-10 Coding Code Description L89.153 Pressure ulcer of sacral region, stage 3 M86.60 Other chronic osteomyelitis, unspecified site I63.9 Cerebral infarction, unspecified E11.9 Type 2 diabetes mellitus without complications Follow-up Appointments Return Appointment in 1 week. Bathing/ Shower/ Hygiene May shower and wash wound with soap and water. Off-Loading Turn and reposition every 2 hours [place] wound care orders this week; continue Home  Health for wound care. May utilize formulary equivalent dressing for wound treatment orders unless otherwise specified. Other Home Health Orders/Instructions: - [facility] Wound Treatment Wound #1 - Sacrum Peri-Wound Care: Skin Prep (Home Health) Every Other Day/15 Days Discharge Instructions: Use skin prep as directed Prim Dressing: KerraCel Ag Gelling Fiber Dressing, 4x5 in (silver alginate) (Home Health) Every Other Day/15 Days ary  Discharge Instructions: Apply silver alginate to wound bed, line wound bed with alginate and then pack lightly to fil space Secondary Dressing: Woven Gauze Sponge, Non-Sterile 4x4 in [facility]) Every Other Day/15 Days Discharge Instructions: Apply over primary dressing as directed. Secured With: Merchandiser, retail Dressing 4x4.75 (in/in) (Home Health) Every Other Day/15 Days Discharge Instructions: Secure dressing with Tegaderm as directed. Consults Infectious Disease - chronic osteomyelitis of sacrum, stage 3 sacral pressure ulcer - (ICD10 M86.60 - Other chronic osteomyelitis, unspecified site) Electronic Signature(s) Signed: 06/06/2020 6:22:56 PM By: Kalman Shan DO Previous Signature: 06/03/2020 4:57:45 PM Version By: Baruch Gouty RN, BSN Entered By: Kalman Shan on 06/06/2020 18:00:44 Prescription 06/03/2020 -------------------------------------------------------------------------------- Ephraim Hamburger, Maureen L. Kalman Shan DO Patient Name: Provider: [birth year] [id number] Date of Birth: NPI#: Male [id number] Sex: DEA #: [phone] Phone #: License #: [place] Patient Address: [address] [place], [place] [address] [place], [place] [address] [phone] Allergies No Known Allergies Provider's Orders Infectious Disease - ICD10: M86.60 - chronic osteomyelitis of sacrum, stage 3 sacral pressure ulcer Hand Signature: Date(s): Electronic  Signature(s) Signed: 06/06/2020 6:22:56 PM By: Kalman Shan DO Previous Signature: 06/03/2020 4:57:45 PM Version By: Baruch Gouty RN, BSN Entered By: Kalman Shan on 06/06/2020 18:00:44 -------------------------------------------------------------------------------- Problem List Details Patient Name: Date of Service: [name], [name] 06/03/2020 9:00 A M Medical Record Number: [id number] Patient Account Number: [id number] Date of Birth/Sex: Treating RN: 10/26/1950 (70 y.o. Male) Baruch Gouty Primary Care Provider: Jonah Blue UA N Other Clinician: Referring Provider: Treating Provider/Extender: Pilar Jarvis in Treatment: 0 Active Problems ICD-10 Encounter Code Description Active Date MDM Diagnosis L89.153 Pressure ulcer of sacral region, stage 3 06/06/2020 No Yes M86.60 Other chronic osteomyelitis, unspecified site 06/03/2020 No Yes I63.9 Cerebral infarction, unspecified 06/03/2020 No Yes E11.9 Type 2 diabetes mellitus without complications 3/00/9233 No Yes Inactive Problems Resolved Problems Electronic Signature(s) Signed: 06/06/2020 6:22:56 PM By: Kalman Shan DO Entered By: Kalman Shan on 06/06/2020 17:57:58 -------------------------------------------------------------------------------- Progress Note Details Patient Name: Date of Service: [name], [name] 06/03/2020 9:00 A M Medical Record Number: [id number] Patient Account Number: [id number] Date of Birth/Sex: Treating RN: November 30, 1950 (70 y.o. Male) Baruch Gouty Primary Care Provider: Jonah Blue UA N Other Clinician: Referring Provider: Treating Provider/Extender: Pilar Jarvis in Treatment: 0 Subjective Chief Complaint Information obtained from Patient Sacral Ulcer History of Present Illness (HPI) Mr. Duchemin is a 70 year old male with a past medical history of CVA, type 2 diabetes and chronic osteomyelitis that presents the clinic today for a  sacral ulcer. Patient was hospitalized on 04/12/2020 for abdominal pain found to have a GI bleed. At that time he was noted to have a stage IV decubitus ulcer. He had a CT scan that showed chronic osteomyelitis of the sacrum. He has not had treatment for this. Today he states he feels well. He resides in a nursing facility and has difficulty with mobility. This has been an ongoing wound for the patient over several years. He denies any fever/chills, nausea/vomiting, purulent drainage or increased warmth or erythema to the wound. Patient History Information obtained from Patient. Allergies No Known Allergies Family History Cancer - Siblings, Heart Disease, Hypertension, Stroke, No family history of Diabetes, Hereditary Spherocytosis, Kidney Disease, Lung Disease, Seizures, Thyroid Problems, Tuberculosis. Social History Former smoker, Marital Status - Single, Alcohol Use - Never - prior history, Drug Use - No History, Caffeine Use - Moderate.  Medical History Eyes Denies history of Cataracts, Glaucoma, Optic Neuritis Ear/Nose/Mouth/Throat Denies history of Chronic sinus problems/congestion, Middle ear problems Hematologic/Lymphatic Patient has history of Anemia Denies history of Hemophilia, Human Immunodeficiency Virus, Lymphedema, Sickle Cell Disease Respiratory Patient has history of Asthma, Chronic Obstructive Pulmonary Disease (COPD) Denies history of Aspiration, Pneumothorax, Sleep Apnea, Tuberculosis Cardiovascular Patient has history of Congestive Heart Failure, Coronary Artery Disease, Hypertension, Hypotension, Myocardial Infarction - hx, Peripheral Arterial Disease, Peripheral Venous Disease Denies history of Angina, Arrhythmia, Deep Vein Thrombosis, Phlebitis, Vasculitis Gastrointestinal Patient has history of Cirrhosis Denies history of Colitis, Crohnoos, Hepatitis A, Hepatitis B, Hepatitis C Endocrine Denies history of Type I Diabetes, Type II  Diabetes Genitourinary Denies history of End Stage Renal Disease Immunological Denies history of Lupus Erythematosus, Raynaudoos, Scleroderma Integumentary (Skin) Denies history of History of Burn Musculoskeletal Patient has history of Rheumatoid Arthritis, Osteoarthritis Denies history of Gout, Osteomyelitis Neurologic Patient has history of Dementia Denies history of Neuropathy, Quadriplegia, Paraplegia, Seizure Disorder Oncologic Denies history of Received Chemotherapy, Received Radiation Psychiatric Denies history of Anorexia/bulimia, Confinement Anxiety Medical A Surgical History Notes nd Genitourinary urinary incontincence with urinary catheter Review of Systems (ROS) Constitutional Symptoms (General Health) Denies complaints or symptoms of Fatigue, Fever, Chills, Marked Weight Change. Eyes Denies complaints or symptoms of Dry Eyes, Vision Changes, Glasses / Contacts. Ear/Nose/Mouth/Throat Denies complaints or symptoms of Chronic sinus problems or rhinitis. Respiratory Denies complaints or symptoms of Chronic or frequent coughs, Shortness of Breath. Cardiovascular Denies complaints or symptoms of Chest pain. Gastrointestinal Denies complaints or symptoms of Frequent diarrhea, Nausea, Vomiting. Endocrine Denies complaints or symptoms of Heat/cold intolerance. Genitourinary Denies complaints or symptoms of Frequent urination. Integumentary (Skin) Complains or has symptoms of Wounds. Musculoskeletal Denies complaints or symptoms of Muscle Pain, Muscle Weakness. Neurologic Complains or has symptoms of Numbness/parasthesias - left sided paralysis/weakness/ d/t CVA. Psychiatric Denies complaints or symptoms of Claustrophobia, Suicidal. Objective Constitutional respirations regular, non-labored and within target range for patient.. Vitals Time Taken: 9:34 AM, Height: 72 in, Source: Stated, Weight: 238 lbs, Source: Stated, BMI: 32.3, Temperature: 98.3 F, Pulse: 101  bpm, Respiratory Rate: 17 breaths/min, Blood Pressure: 157/81 mmHg. Psychiatric pleasant and cooperative. General Notes: Sacrum: Overall the wound bed looks healthy. There is no necrotic tissue. There are no signs of infection. Surrounding skin is intact. Integumentary (Hair, Skin) Wound #1 status is Open. Original cause of wound was Gradually Appeared. The date acquired was: 05/10/2017. The wound is located on the Sacrum. The wound measures 3.5cm length x 3.5cm width x 1cm depth; 9.621cm^2 area and 9.621cm^3 volume. There is Fat Layer (Subcutaneous Tissue) exposed. There is no tunneling noted, however, there is undermining starting at 8:00 and ending at 4:00 with a maximum distance of 1.5cm. There is a large amount of purulent drainage noted. The wound margin is distinct with the outline attached to the wound base. There is large (67-100%) red, pink granulation within the wound bed. There is no necrotic tissue within the wound bed. Assessment Active Problems ICD-10 Pressure ulcer of sacral region, stage 3 Other chronic osteomyelitis, unspecified site Cerebral infarction, unspecified Type 2 diabetes mellitus without complications Patient presents with a several year history of a chronic sacral wound. There are no acute signs of infection today. He did have a CT chest abdomen pelvis on 04/12/2020 that showed a large decubitus ulcer overlying the sacrococcygeal junction with evidence of chronic osteomyelitis at the sacrococcygeal junction. I will like to do a referral to infectious disease for further evaluation of treatment. At this  time I would like to use silver alginate every other day. We will have him follow-up in 1 week. Plan Follow-up Appointments: Return Appointment in 1 week. Bathing/ Shower/ Hygiene: May shower and wash wound with soap and water. Off-Loading: Turn and reposition every 2 hours Home Health: New wound care orders this week; continue Home Health for wound care. May  utilize formulary equivalent dressing for wound treatment orders unless otherwise specified. Other Home Health Orders/Instructions: - [facility] Consults ordered were: Infectious Disease - chronic osteomyelitis of sacrum, stage 3 sacral pressure ulcer WOUND #1: - Sacrum Wound Laterality: Peri-Wound Care: Skin Prep (Home Health) Every Other Day/15 Days Discharge Instructions: Use skin prep as directed Prim Dressing: KerraCel Ag Gelling Fiber Dressing, 4x5 in (silver alginate) (Home Health) Every Other Day/15 Days ary Discharge Instructions: Apply silver alginate to wound bed, line wound bed with alginate and then pack lightly to fil space Secondary Dressing: Woven Gauze Sponge, Non-Sterile 4x4 in [facility]) Every Other Day/15 Days Discharge Instructions: Apply over primary dressing as directed. Secured With: T egaderm Transparent Film Dressing 4x4.75 (in/in) (Home Health) Every Other Day/15 Days Discharge Instructions: Secure dressing with T egaderm as directed. 1. Silver alginate every other day 2. Infectious disease consult Electronic Signature(s) Signed: 06/06/2020 6:22:56 PM By: Kalman Shan DO Entered By: Kalman Shan on 06/06/2020 18:03:22 -------------------------------------------------------------------------------- HxROS Details Patient Name: Date of Service: [name], [name] 06/03/2020 9:00 A M Medical Record Number: [id number] Patient Account Number: [id number] Date of Birth/Sex: Treating RN: [birth year] (70 y.o. Male) Rhae Hammock Primary Care Provider: Jonah Blue UA N Other Clinician: Referring Provider: Treating Provider/Extender: Pilar Jarvis in Treatment: 0 Information Obtained From Patient Constitutional Symptoms (General Health) Complaints and Symptoms: Negative for: Fatigue; Fever; Chills; Marked Weight Change Eyes Complaints and Symptoms: Negative for: Dry Eyes; Vision Changes; Glasses / Contacts Medical History: Negative  for: Cataracts; Glaucoma; Optic Neuritis Ear/Nose/Mouth/Throat Complaints and Symptoms: Negative for: Chronic sinus problems or rhinitis Medical History: Negative for: Chronic sinus problems/congestion; Middle ear problems Respiratory Complaints and Symptoms: Negative for: Chronic or frequent coughs; Shortness of Breath Medical History: Positive for: Asthma; Chronic Obstructive Pulmonary Disease (COPD) Negative for: Aspiration; Pneumothorax; Sleep Apnea; Tuberculosis Cardiovascular Complaints and Symptoms: Negative for: Chest pain Medical History: Positive for: Congestive Heart Failure; Coronary Artery Disease; Hypertension; Hypotension; Myocardial Infarction - hx; Peripheral Arterial Disease; Peripheral Venous Disease Negative for: Angina; Arrhythmia; Deep Vein Thrombosis; Phlebitis; Vasculitis Gastrointestinal Complaints and Symptoms: Negative for: Frequent diarrhea; Nausea; Vomiting Medical History: Positive for: Cirrhosis Negative for: Colitis; Crohns; Hepatitis A; Hepatitis B; Hepatitis C Endocrine Complaints and Symptoms: Negative for: Heat/cold intolerance Medical History: Negative for: Type I Diabetes; Type II Diabetes Genitourinary Complaints and Symptoms: Negative for: Frequent urination Medical History: Negative for: End Stage Renal Disease Past Medical History Notes: urinary incontincence with urinary catheter Integumentary (Skin) Complaints and Symptoms: Positive for: Wounds Medical History: Negative for: History of Burn Musculoskeletal Complaints and Symptoms: Negative for: Muscle Pain; Muscle Weakness Medical History: Positive for: Rheumatoid Arthritis; Osteoarthritis Negative for: Gout; Osteomyelitis Neurologic Complaints and Symptoms: Positive for: Numbness/parasthesias - left sided paralysis/weakness/ d/t CVA Medical History: Positive for: Dementia Negative for: Neuropathy; Quadriplegia; Paraplegia; Seizure Disorder Psychiatric Complaints and  Symptoms: Negative for: Claustrophobia; Suicidal Medical History: Negative for: Anorexia/bulimia; Confinement Anxiety Hematologic/Lymphatic Medical History: Positive for: Anemia Negative for: Hemophilia; Human Immunodeficiency Virus; Lymphedema; Sickle Cell Disease Immunological Medical History: Negative for: Lupus Erythematosus; Raynauds; Scleroderma Oncologic Medical History: Negative for: Received Chemotherapy; Received Radiation Immunizations Pneumococcal Vaccine: Received  Pneumococcal Vaccination: No Implantable Devices None Family and Social History Cancer: Yes - Siblings; Diabetes: No; Heart Disease: Yes; Hereditary Spherocytosis: No; Hypertension: Yes; Kidney Disease: No; Lung Disease: No; Seizures: No; Stroke: Yes; Thyroid Problems: No; Tuberculosis: No; Former smoker; Marital Status - Single; Alcohol Use: Never - prior history; Drug Use: No History; Caffeine Use: Moderate; Financial Concerns: No; Food, Clothing or Shelter Needs: No; Support System Lacking: No; Transportation Concerns: No Electronic Signature(s) Signed: 06/06/2020 6:22:56 PM By: Kalman Shan DO Signed: 06/14/2020 3:14:58 PM By: Rhae Hammock RN Entered By: Rhae Hammock on 06/03/2020 09:45:33 -------------------------------------------------------------------------------- SuperBill Details Patient Name: Date of Service: [name], [name] 06/03/2020 Medical Record Number: [id number] Patient Account Number: [id number] Date of Birth/Sex: Treating RN: [birth year] (70 y.o. Male) Baruch Gouty Primary Care Provider: Jonah Blue UA N Other Clinician: Referring Provider: Treating Provider/Extender: Pilar Jarvis in Treatment: 0 Diagnosis Coding ICD-10 Codes Code Description L89.153 Pressure ulcer of sacral region, stage 3 M86.60 Other chronic osteomyelitis, unspecified site I63.9 Cerebral infarction, unspecified E11.9 Type 2 diabetes mellitus without  complications Facility Procedures CPT4 Code: [id number] Description: 99214 - WOUND CARE VISIT-LEV 4 EST PT Modifier: Quantity: 1 Electronic Signature(s) Signed: 06/06/2020 6:22:56 PM By: Kalman Shan DO Signed: 06/06/2020 6:22:56 PM By: Kalman Shan DO Previous Signature: 06/03/2020 4:57:45 PM Version By: Baruch Gouty RN, BSN Entered By: Kalman Shan on 06/06/2020 18:04:03

## 2020-06-14 NOTE — Chronic Care Management (AMB) (Signed)
  Care Management   Social Work Visit Note  06/14/2020 Name: [name] MRN: [id number] DOB: 07/08/1950  [name] is a 70 y.o. year old male who sees Gaylan Gerold, DO for primary care. The care management team was consulted for assistance with care management and care coordination needs related to Level of Care Concerns   Patient was given the following information about care management and care coordination services today, agreed to services, and gave verbal consent: 1.care management/care coordination services include personalized support from designated clinical staff supervised by their physician, including individualized plan of care and coordination with other care providers 2. 24/7 contact phone numbers for assistance for urgent and routine care needs. 3. The patient may stop care management/care coordination services at any time by phone call to the office staff.  Engaged with patient by telephone for follow up visit in response to provider referral for social work chronic care management and care coordination services.  Assessment: Review of patient history, allergies, and health status during evaluation of patient need for care management/care coordination services.    Interventions:  . Patient interviewed and appropriate assessments performed . Collaborated with clinical team regarding patient needs  . Advised patient to contact DSS by 06/21/2020, to verify FL2 was received.   SDOH (Social Determinants of Health) assessments performed: No     Plan:  . patient will work with BSW to address needs related to Level of care concerns . Follow up with provider re: FL2 application by 55/73/2202.  Milus Height, [place]  Social Worker [facility] Care Management  [phone]

## 2020-06-14 NOTE — Patient Instructions (Signed)
Visit Information  Instructions: patient will follow up with [place]* as directed by SW  Patient was given the following information about care management and care coordination services today, agreed to services, and gave verbal consent: 1.care management/care coordination services include personalized support from designated clinical staff supervised by their physician, including individualized plan of care and coordination with other care providers 2. 24/7 contact phone numbers for assistance for urgent and routine care needs. 3. The patient may stop care management/care coordination services at any time by phone call to the office staff.  Patient verbalizes understanding of instructions provided today and agrees to view in [place].   Telephone follow up appointment with care management team member scheduled for:the next 7 days.  Milus Height, [place]  Social Worker [facility] Care Management  [phone]

## 2020-06-14 NOTE — Progress Notes (Signed)
[name] ([id number]) Visit Report for 06/03/2020 Allergy List Details Patient Name: Date of Service: [name], [place] NA LD L. 06/03/2020 9:00 A M Medical Record Number: [id number] Patient Account Number: [id number] Date of Birth/Sex: Treating RN: Nov 11, 1950 (70 y.o. Male) Rhae Hammock Primary Care Nycole Kawahara: Jonah Blue UA N Other Clinician: Referring Pattrick Bady: Treating Melanny Wire/Extender: Doristine Church Weeks in Treatment: 0 Allergies Active Allergies No Known Allergies Allergy Notes Electronic Signature(s) Signed: 06/14/2020 3:14:58 PM By: Rhae Hammock RN Entered By: Rhae Hammock on 06/03/2020 09:35:44 -------------------------------------------------------------------------------- Arrival Information Details Patient Name: Date of Service: [name], [name] 06/03/2020 9:00 A M Medical Record Number: [id number] Patient Account Number: [id number] Date of Birth/Sex: Treating RN: [birth year] (70 y.o. Male) Rhae Hammock Primary Care Candace Ramus: Jonah Blue UA N Other Clinician: Referring Jaiden Wahab: Treating Jensyn Shave/Extender: Pilar Jarvis in Treatment: 0 Visit Information Patient Arrived: Stretcher Arrival Time: 09:31 Accompanied By: Mady Gemma Transfer Assistance: None Patient Identification Verified: Yes Secondary Verification Process Completed: Yes Patient Requires Transmission-Based Precautions: No Patient Has Alerts: No Electronic Signature(s) Signed: 06/14/2020 3:14:58 PM By: Rhae Hammock RN Entered By: Rhae Hammock on 06/03/2020 09:32:08 -------------------------------------------------------------------------------- Clinic Level of Care Assessment Details Patient Name: Date of Service: [name], [name] 06/03/2020 9:00 A M Medical Record Number: [id number] Patient Account Number: [id number] Date of Birth/Sex: Treating RN: Jan 25, 1951 (70 y.o. Male) Baruch Gouty Primary Care Anessia Oakland: Jonah Blue UA N Other  Clinician: Referring Saryiah Bencosme: Treating Charnetta Wulff/Extender: Pilar Jarvis in Treatment: 0 Clinic Level of Care Assessment Items TOOL 2 Quantity Score []  - 0 Use when only an EandM is performed on the INITIAL visit ASSESSMENTS - Nursing Assessment / Reassessment X- 1 20 General Physical Exam (combine w/ comprehensive assessment (listed just below) when performed on new pt. evals) X- 1 25 Comprehensive Assessment (HX, ROS, Risk Assessments, Wounds Hx, etc.) ASSESSMENTS - Wound and Skin A ssessment / Reassessment X - Simple Wound Assessment / Reassessment - one wound 1 5 []  - 0 Complex Wound Assessment / Reassessment - multiple wounds []  - 0 Dermatologic / Skin Assessment (not related to wound area) ASSESSMENTS - Ostomy and/or Continence Assessment and Care []  - 0 Incontinence Assessment and Management []  - 0 Ostomy Care Assessment and Management (repouching, etc.) PROCESS - Coordination of Care X - Simple Patient / Family Education for ongoing care 1 15 []  - 0 Complex (extensive) Patient / Family Education for ongoing care X- 1 10 Staff obtains Programmer, systems, Records, T Results / Process Orders est X- 1 10 Staff telephones HHA, Nursing Homes / Clarify orders / etc []  - 0 Routine Transfer to another Facility (non-emergent condition) []  - 0 Routine Hospital Admission (non-emergent condition) X- 1 15 New Admissions / Biomedical engineer / Ordering NPWT Apligraf, etc. , []  - 0 Emergency Hospital Admission (emergent condition) X- 1 10 Simple Discharge Coordination []  - 0 Complex (extensive) Discharge Coordination PROCESS - Special Needs []  - 0 Pediatric / Minor Patient Management []  - 0 Isolation Patient Management []  - 0 Hearing / Language / Visual special needs []  - 0 Assessment of Community assistance (transportation, D/C planning, etc.) []  - 0 Additional assistance / Altered mentation []  - 0 Support Surface(s) Assessment (bed, cushion, seat,  etc.) INTERVENTIONS - Wound Cleansing / Measurement X- 1 5 Wound Imaging (photographs - any number of wounds) []  - 0 Wound Tracing (instead of photographs) X- 1 5 Simple Wound Measurement - one wound []  - 0 Complex Wound Measurement - multiple  wounds X- 1 5 Simple Wound Cleansing - one wound []  - 0 Complex Wound Cleansing - multiple wounds INTERVENTIONS - Wound Dressings X - Small Wound Dressing one or multiple wounds 1 10 []  - 0 Medium Wound Dressing one or multiple wounds []  - 0 Large Wound Dressing one or multiple wounds []  - 0 Application of Medications - injection INTERVENTIONS - Miscellaneous []  - 0 External ear exam []  - 0 Specimen Collection (cultures, biopsies, blood, body fluids, etc.) []  - 0 Specimen(s) / Culture(s) sent or taken to Lab for analysis []  - 0 Patient Transfer (multiple staff / Harrel Lemon Lift / Similar devices) []  - 0 Simple Staple / Suture removal (25 or less) []  - 0 Complex Staple / Suture removal (26 or more) []  - 0 Hypo / Hyperglycemic Management (close monitor of Blood Glucose) []  - 0 Ankle / Brachial Index (ABI) - do not check if billed separately Has the patient been seen at the hospital within the last three years: Yes Total Score: 135 Level Of Care: New/Established - Level 4 Electronic Signature(s) Signed: 06/03/2020 4:57:45 PM By: Baruch Gouty RN, BSN Entered By: Baruch Gouty on 06/03/2020 10:29:01 -------------------------------------------------------------------------------- Encounter Discharge Information Details Patient Name: Date of Service: [name], [name] 06/03/2020 9:00 A M Medical Record Number: [id number] Patient Account Number: [id number] Date of Birth/Sex: Treating RN: [birth year] (70 y.o. Male) Lorrin Jackson Primary Care Luismanuel Corman: Jonah Blue UA N Other Clinician: Referring Onyekachi Gathright: Treating Khamia Stambaugh/Extender: Pilar Jarvis in Treatment: 0 Encounter Discharge Information Items Discharge  Condition: Stable Ambulatory Status: Stretcher Discharge Destination: Home Transportation: Ambulance Accompanied By: Caregiver Schedule Follow-up Appointment: Yes Clinical Summary of Care: Provided on 06/03/2020 Form Type Recipient Paper Patient Patient Electronic Signature(s) Signed: 06/03/2020 10:40:41 AM By: Lorrin Jackson Previous Signature: 06/03/2020 10:32:46 AM Version By: Lorrin Jackson Entered By: Lorrin Jackson on 06/03/2020 10:40:40 -------------------------------------------------------------------------------- Lower Extremity Assessment Details Patient Name: Date of Service: [name], [name] 06/03/2020 9:00 A M Medical Record Number: [id number] Patient Account Number: [id number] Date of Birth/Sex: Treating RN: 1950/06/20 (70 y.o. Male) Rhae Hammock Primary Care Ronny Korff: Jonah Blue UA N Other Clinician: Referring Ario Mcdiarmid: Treating Olayinka Gathers/Extender: Doristine Church Weeks in Treatment: 0 Electronic Signature(s) Signed: 06/14/2020 3:14:58 PM By: Rhae Hammock RN Entered By: Rhae Hammock on 06/03/2020 09:38:00 -------------------------------------------------------------------------------- Multi Wound Chart Details Patient Name: Date of Service: [name], [name] 06/03/2020 9:00 A M Medical Record Number: [id number] Patient Account Number: [id number] Date of Birth/Sex: Treating RN: 12-18-1950 (70 y.o. Male) Baruch Gouty Primary Care Dak Szumski: Jonah Blue UA N Other Clinician: Referring Murline Weigel: Treating Jonanthony Nahar/Extender: Pilar Jarvis in Treatment: 0 Vital Signs Height(in): 72 Pulse(bpm): 101 Weight(lbs): 238 Blood Pressure(mmHg): 157/81 Body Mass Index(BMI): 32 Temperature(F): 98.3 Respiratory Rate(breaths/min): 17 Photos: [N/A:N/A] Sacrum N/A N/A Wound Location: Gradually Appeared N/A N/A Wounding Event: Pressure Ulcer N/A N/A Primary Etiology: Anemia, Asthma, Chronic Obstructive N/A N/A Comorbid  History: Pulmonary Disease (COPD), Congestive Heart Failure, Coronary Artery Disease, Hypertension, Hypotension, Myocardial Infarction, Peripheral Arterial Disease, Peripheral Venous Disease, Cirrhosis , Rheumatoid Arthritis, Osteoarthritis, Dementia 05/10/2017 N/A N/A Date Acquired: 0 N/A N/A Weeks of Treatment: Open N/A N/A Wound Status: 3.5x3.5x1 N/A N/A Measurements L x W x D (cm) 9.621 N/A N/A A (cm) : rea 9.621 N/A N/A Volume (cm) : 0.00% N/A N/A % Reduction in A rea: 0.00% N/A N/A % Reduction in Volume: 8 Starting Position 1 (o'clock): 4 Ending Position 1 (o'clock): 1.5 Maximum Distance 1 (cm): Yes  N/A N/A Undermining: Category/Stage III N/A N/A Classification: Large N/A N/A Exudate A mount: Purulent N/A N/A Exudate Type: yellow, brown, green N/A N/A Exudate Color: Distinct, outline attached N/A N/A Wound Margin: Large (67-100%) N/A N/A Granulation A mount: Red, Pink N/A N/A Granulation Quality: None Present (0%) N/A N/A Necrotic A mount: Fat Layer (Subcutaneous Tissue): Yes N/A N/A Exposed Structures: Fascia: No Tendon: No Muscle: No Joint: No Bone: No None N/A N/A Epithelialization: Treatment Notes Wound #1 (Sacrum) Cleanser Peri-Wound Care Skin Prep Discharge Instruction: Use skin prep as directed Topical Primary Dressing KerraCel Ag Gelling Fiber Dressing, 4x5 in (silver alginate) Discharge Instruction: Apply silver alginate to wound bed, line wound bed with alginate and then pack lightly to fil space Secondary Dressing Woven Gauze Sponge, Non-Sterile 4x4 in Discharge Instruction: Apply over primary dressing as directed. Secured With Tegaderm Transparent Film Dressing 4x4.75 (in/in) Discharge Instruction: Secure dressing with Tegaderm as directed. Compression Wrap Compression Stockings Add-Ons Electronic Signature(s) Signed: 06/06/2020 6:22:56 PM By: Kalman Shan DO Signed: 06/07/2020 5:21:08 PM By: Baruch Gouty RN,  BSN Entered By: Kalman Shan on 06/06/2020 17:58:07 -------------------------------------------------------------------------------- Multi-Disciplinary Care Plan Details Patient Name: Date of Service: [name], [name] 06/03/2020 9:00 A M Medical Record Number: [id number] Patient Account Number: [id number] Date of Birth/Sex: Treating RN: [birth year] (70 y.o. Male) Baruch Gouty Primary Care Eion Timbrook: Jonah Blue UA N Other Clinician: Referring Khyleigh Furney: Treating Lashona Schaaf/Extender: Pilar Jarvis in Treatment: [place] reviewed with physician Active Inactive Pressure Nursing Diagnoses: Knowledge deficit related to management of pressures ulcers Goals: Patient/caregiver will verbalize understanding of pressure ulcer management Date Initiated: 06/03/2020 Target Resolution Date: 07/01/2020 Goal Status: Active Interventions: Assess: immobility, friction, shearing, incontinence upon admission and as needed Assess offloading mechanisms upon admission and as needed Assess potential for pressure ulcer upon admission and as needed Provide education on pressure ulcers Notes: Wound/Skin Impairment Nursing Diagnoses: Impaired tissue integrity Knowledge deficit related to ulceration/compromised skin integrity Goals: Patient/caregiver will verbalize understanding of skin care regimen Date Initiated: 06/03/2020 Target Resolution Date: 07/01/2020 Goal Status: Active Ulcer/skin breakdown will have a volume reduction of 30% by week 4 Date Initiated: 06/03/2020 Target Resolution Date: 07/01/2020 Goal Status: Active Interventions: Assess patient/caregiver ability to obtain necessary supplies Assess patient/caregiver ability to perform ulcer/skin care regimen upon admission and as needed Assess ulceration(s) every visit Provide education on ulcer and skin care Treatment Activities: Skin care regimen initiated : 06/03/2020 Topical wound management  initiated : 06/03/2020 Notes: Electronic Signature(s) Signed: 06/03/2020 4:57:45 PM By: Baruch Gouty RN, BSN Entered By: Baruch Gouty on 06/03/2020 10:27:26 -------------------------------------------------------------------------------- Pain Assessment Details Patient Name: Date of Service: [name], [name] 06/03/2020 9:00 A M Medical Record Number: [id number] Patient Account Number: [id number] Date of Birth/Sex: Treating RN: 1950-09-24 (70 y.o. Male) Rhae Hammock Primary Care Marko Skalski: Jonah Blue UA N Other Clinician: Referring Ante Arredondo: Treating Janeka Libman/Extender: Pilar Jarvis in Treatment: 0 Active Problems Location of Pain Severity and Description of Pain Patient Has Paino Yes Site Locations Pain Location: Pain in Ulcers With Dressing Change: Yes Duration of the Pain. Constant / Intermittento Constant Rate the pain. Current Pain Level: 8 Worst Pain Level: 10 Least Pain Level: 0 Tolerable Pain Level: 8 Character of Pain Describe the Pain: Aching Pain Management and Medication Current Pain Management: Medication: Yes Cold Application: No Rest: Yes Massage: No Activity: No T.E.N.S.: No Heat Application: No Leg drop or elevation: No Is the Current Pain Management Adequate: Inadequate How does your  wound impact your activities of daily livingo Sleep: No Bathing: No Appetite: No Relationship With Others: No Bladder Continence: No Emotions: No Bowel Continence: No Work: No Toileting: No Drive: No Dressing: No Hobbies: No Electronic Signature(s) Signed: 06/14/2020 3:14:58 PM By: Rhae Hammock RN Entered By: Rhae Hammock on 06/03/2020 09:38:31 -------------------------------------------------------------------------------- Patient/Caregiver Education Details Patient Name: Date of Service: [name], [name] 4/25/2022andnbsp9:00 A M Medical Record Number: [id number] Patient Account Number: [id number] Date of  Birth/Gender: Treating RN: [birth year] (70 y.o. Male) Baruch Gouty Primary Care Physician: Jonah Blue UA N Other Clinician: Referring Physician: Treating Physician/Extender: Pilar Jarvis in Treatment: 0 Education Assessment Education Provided To: Patient Education Topics Provided Pressure: Handouts: Pressure Ulcers: Care and Offloading, Pressure Ulcers: Care and Offloading 2, Preventing Pressure Ulcers Methods: Explain/Verbal, Printed Responses: Reinforcements needed, State content correctly Wound/Skin Impairment: Handouts: Caring for Your Ulcer, Skin Care Do's and Dont's Methods: Explain/Verbal, Printed Responses: Reinforcements needed, State content correctly Electronic Signature(s) Signed: 06/03/2020 4:57:45 PM By: Baruch Gouty RN, BSN Entered By: Baruch Gouty on 06/03/2020 10:28:21 -------------------------------------------------------------------------------- Wound Assessment Details Patient Name: Date of Service: [name], [name] 06/03/2020 9:00 A M Medical Record Number: [id number] Patient Account Number: [id number] Date of Birth/Sex: Treating RN: 1951/01/10 (70 y.o. Male) Rhae Hammock Primary Care Dorothey Oetken: Jonah Blue UA N Other Clinician: Referring Nicolo Tomko: Treating Damarko Stitely/Extender: Doristine Church Weeks in Treatment: 0 Wound Status Wound Number: 1 Primary Pressure Ulcer Etiology: Wound Location: Sacrum Wound Open Wounding Event: Gradually Appeared Status: Date Acquired: 05/10/2017 Comorbid Anemia, Asthma, Chronic Obstructive Pulmonary Disease (COPD), Weeks Of Treatment: 0 History: Congestive Heart Failure, Coronary Artery Disease, Hypertension, Clustered Wound: No Hypotension, Myocardial Infarction, Peripheral Arterial Disease, Peripheral Venous Disease, Cirrhosis , Rheumatoid Arthritis, Osteoarthritis, Dementia Photos Wound Measurements Length: (cm) 3.5 Width: (cm) 3.5 Depth: (cm) 1 Area: (cm)  9.621 Volume: (cm) 9.621 % Reduction in Area: 0% % Reduction in Volume: 0% Epithelialization: None Tunneling: No Undermining: Yes Starting Position (o'clock): 8 Ending Position (o'clock): 4 Maximum Distance: (cm) 1.5 Wound Description Classification: Category/Stage III Wound Margin: Distinct, outline attached Exudate Amount: Large Exudate Type: Purulent Exudate Color: yellow, brown, green Foul Odor After Cleansing: No Slough/Fibrino No Wound Bed Granulation Amount: Large (67-100%) Exposed Structure Granulation Quality: Red, Pink Fascia Exposed: No Necrotic Amount: None Present (0%) Fat Layer (Subcutaneous Tissue) Exposed: Yes Tendon Exposed: No Muscle Exposed: No Joint Exposed: No Bone Exposed: No Treatment Notes Wound #1 (Sacrum) Cleanser Peri-Wound Care Skin Prep Discharge Instruction: Use skin prep as directed Topical Primary Dressing KerraCel Ag Gelling Fiber Dressing, 4x5 in (silver alginate) Discharge Instruction: Apply silver alginate to wound bed, line wound bed with alginate and then pack lightly to fil space Secondary Dressing Woven Gauze Sponge, Non-Sterile 4x4 in Discharge Instruction: Apply over primary dressing as directed. Secured With Tegaderm Transparent Film Dressing 4x4.75 (in/in) Discharge Instruction: Secure dressing with Tegaderm as directed. Compression Wrap Compression Stockings Add-Ons Electronic Signature(s) Signed: 06/04/2020 2:09:33 PM By: Sandre Kitty Signed: 06/14/2020 3:14:58 PM By: Rhae Hammock RN Entered By: Sandre Kitty on 06/03/2020 15:12:07 -------------------------------------------------------------------------------- Vitals Details Patient Name: Date of Service: [name], [name] 06/03/2020 9:00 A M Medical Record Number: [id number] Patient Account Number: [id number] Date of Birth/Sex: Treating RN: 05/25/1950 (70 y.o. Male) Rhae Hammock Primary Care Davielle Lingelbach: Jonah Blue UA N Other  Clinician: Referring Alonna Bartling: Treating Kerrie Latour/Extender: Pilar Jarvis in Treatment: 0 Vital Signs Time Taken: 09:34 Temperature (F): 98.3 Height (in): 72 Pulse (bpm): 101 Source:  Stated Respiratory Rate (breaths/min): 17 Weight (lbs): 238 Blood Pressure (mmHg): 157/81 Source: Stated Reference Range: 80 - 120 mg / dl Body Mass Index (BMI): 32.3 Electronic Signature(s) Signed: 06/14/2020 3:14:58 PM By: Rhae Hammock RN Entered By: Rhae Hammock on 06/03/2020 09:35:11

## 2020-06-17 ENCOUNTER — Telehealth: Payer: Self-pay

## 2020-06-17 DIAGNOSIS — I70229 Atherosclerosis of native arteries of extremities with rest pain, unspecified extremity: Secondary | ICD-10-CM | POA: Diagnosis not present

## 2020-06-17 DIAGNOSIS — S31000A Unspecified open wound of lower back and pelvis without penetration into retroperitoneum, initial encounter: Secondary | ICD-10-CM | POA: Diagnosis not present

## 2020-06-17 DIAGNOSIS — M4647 Discitis, unspecified, lumbosacral region: Secondary | ICD-10-CM | POA: Diagnosis not present

## 2020-06-17 DIAGNOSIS — L89303 Pressure ulcer of unspecified buttock, stage 3: Secondary | ICD-10-CM | POA: Diagnosis not present

## 2020-06-17 DIAGNOSIS — G8929 Other chronic pain: Secondary | ICD-10-CM | POA: Diagnosis not present

## 2020-06-17 NOTE — Telephone Encounter (Signed)
  [name], There is a note from 5/2 from [place] the [facility] was completed and sent to DSS.  I see there are a couple of notes after 5/2 regarding the FL2 as well.  Is the patient supposed to be coming in for an appt or just the [facility]?  Is the front desk aware?  If not, do you mind following up? Thank you, Cipriana Biller

## 2020-06-17 NOTE — Telephone Encounter (Signed)
Hi Bianca,  Please see message below, were you trying to call this patient? Thanks, Higinio Roger, RN,BSN

## 2020-06-17 NOTE — Telephone Encounter (Signed)
Pt Aid  Is requesting a call back she stated that she received a  Call stating that pt needs to come in for a appt for his paperwork

## 2020-06-18 ENCOUNTER — Telehealth: Payer: Self-pay | Admitting: *Deleted

## 2020-06-18 DIAGNOSIS — Z7902 Long term (current) use of antithrombotics/antiplatelets: Secondary | ICD-10-CM | POA: Diagnosis not present

## 2020-06-18 DIAGNOSIS — J449 Chronic obstructive pulmonary disease, unspecified: Secondary | ICD-10-CM | POA: Diagnosis not present

## 2020-06-18 DIAGNOSIS — Z87440 Personal history of urinary (tract) infections: Secondary | ICD-10-CM | POA: Diagnosis not present

## 2020-06-18 DIAGNOSIS — M4620 Osteomyelitis of vertebra, site unspecified: Secondary | ICD-10-CM | POA: Diagnosis not present

## 2020-06-18 DIAGNOSIS — I951 Orthostatic hypotension: Secondary | ICD-10-CM | POA: Diagnosis not present

## 2020-06-18 DIAGNOSIS — I5030 Unspecified diastolic (congestive) heart failure: Secondary | ICD-10-CM | POA: Diagnosis not present

## 2020-06-18 DIAGNOSIS — E1169 Type 2 diabetes mellitus with other specified complication: Secondary | ICD-10-CM | POA: Diagnosis not present

## 2020-06-18 DIAGNOSIS — K5900 Constipation, unspecified: Secondary | ICD-10-CM | POA: Diagnosis not present

## 2020-06-18 DIAGNOSIS — R339 Retention of urine, unspecified: Secondary | ICD-10-CM | POA: Diagnosis not present

## 2020-06-18 DIAGNOSIS — I110 Hypertensive heart disease with heart failure: Secondary | ICD-10-CM | POA: Diagnosis not present

## 2020-06-18 DIAGNOSIS — Z8673 Personal history of transient ischemic attack (TIA), and cerebral infarction without residual deficits: Secondary | ICD-10-CM | POA: Diagnosis not present

## 2020-06-18 DIAGNOSIS — Z7984 Long term (current) use of oral hypoglycemic drugs: Secondary | ICD-10-CM | POA: Diagnosis not present

## 2020-06-18 DIAGNOSIS — I69391 Dysphagia following cerebral infarction: Secondary | ICD-10-CM | POA: Diagnosis not present

## 2020-06-18 DIAGNOSIS — Z466 Encounter for fitting and adjustment of urinary device: Secondary | ICD-10-CM | POA: Diagnosis not present

## 2020-06-18 DIAGNOSIS — L89154 Pressure ulcer of sacral region, stage 4: Secondary | ICD-10-CM | POA: Diagnosis not present

## 2020-06-18 DIAGNOSIS — K922 Gastrointestinal hemorrhage, unspecified: Secondary | ICD-10-CM | POA: Diagnosis not present

## 2020-06-18 NOTE — Telephone Encounter (Signed)
Spoke with the patient and his Caregiver [name]). This patient has been sch for a telehealth visit with Dr. Alfonse Spruce  on 06/20/2020 @ 1:45 pm to assess for his FL2 form.

## 2020-06-18 NOTE — Telephone Encounter (Signed)
Pls contact [phone] [name] regarding FL2 form

## 2020-06-18 NOTE — Telephone Encounter (Signed)
Rory Percy, RN with [facility] called in requesting VO for Fleets enema. States she is in home at present to re-certify patient for catheter and wound care and noticed patient's abdomen is distended. Sounds dull to percussion and no real clear BS noted. States she can feel feces in rectum. Verbal auth given. Will route to Yellow Team for agreement/denial. Patient's caregiver is knowledgeable to give enema and will call [facility] with results.

## 2020-06-19 ENCOUNTER — Telehealth: Payer: Self-pay | Admitting: *Deleted

## 2020-06-19 ENCOUNTER — Ambulatory Visit: Payer: Medicare Other

## 2020-06-19 NOTE — Telephone Encounter (Signed)
Hello. Okay for [facility] speech and breathing device. Thank you!

## 2020-06-19 NOTE — Telephone Encounter (Signed)
Lourena Simmonds, Speech Therapist with [facility], called in requesting VO for [facility] Speech therapy 1 week 6 to work on swallowing difficulties. Verbal auth given. Will route to Yellow Team for agreement/denial.  [name] Glad is also requesting to use a resistive breather. States patient has a lot of congestion in chest and this will help him cough it up. Please advise.

## 2020-06-19 NOTE — Telephone Encounter (Signed)
[name] given VO to use resistive breather.

## 2020-06-20 ENCOUNTER — Ambulatory Visit (INDEPENDENT_AMBULATORY_CARE_PROVIDER_SITE_OTHER): Payer: Medicare Other | Admitting: Student

## 2020-06-20 ENCOUNTER — Other Ambulatory Visit (HOSPITAL_COMMUNITY): Payer: Self-pay | Admitting: Urology

## 2020-06-20 ENCOUNTER — Other Ambulatory Visit: Payer: Self-pay

## 2020-06-20 DIAGNOSIS — J449 Chronic obstructive pulmonary disease, unspecified: Secondary | ICD-10-CM

## 2020-06-20 DIAGNOSIS — G4709 Other insomnia: Secondary | ICD-10-CM

## 2020-06-20 DIAGNOSIS — G629 Polyneuropathy, unspecified: Secondary | ICD-10-CM

## 2020-06-20 DIAGNOSIS — R5381 Other malaise: Secondary | ICD-10-CM | POA: Diagnosis not present

## 2020-06-20 DIAGNOSIS — I5032 Chronic diastolic (congestive) heart failure: Secondary | ICD-10-CM | POA: Diagnosis not present

## 2020-06-20 DIAGNOSIS — R339 Retention of urine, unspecified: Secondary | ICD-10-CM

## 2020-06-20 DIAGNOSIS — L98422 Non-pressure chronic ulcer of back with fat layer exposed: Secondary | ICD-10-CM

## 2020-06-20 MED ORDER — TRAZODONE HCL 100 MG PO TABS: 100.0000 mg | ORAL_TABLET | Freq: Every day | ORAL | 2 refills | Status: DC

## 2020-06-20 MED ORDER — FUROSEMIDE 40 MG PO TABS: ORAL_TABLET | ORAL | 3 refills | Status: DC

## 2020-06-20 MED ORDER — PREGABALIN 150 MG PO CAPS: 150.0000 mg | ORAL_CAPSULE | Freq: Two times a day (BID) | ORAL | 2 refills | Status: AC

## 2020-06-20 NOTE — Assessment & Plan Note (Addendum)
Patient was on Wixela and albuterol inhaler for presumed COPD.  No formal PFT in the chart.  Patient has extensive history of smoking.  His caretaker reports that patient had been short of breath in the last 2 weeks.  States that she has to use the albuterol inhaler 1 or 2 times a day.  Reports compliant with Wixela.  Also reports coughing without any wheezing.  Denies orthopnea or worsening LE edema.  Denies fever, chills or sick contact.  Patient was supposed to be on 2 L of oxygen baseline however he did not get his oxygen after he went home.  His caretaker states that his home health nurse did not record oxygen saturation.  Assessment and plan His worsening dyspnea is likely due to from not having oxygen.  Low suspicion for COPD exacerbation.  Low suspicion for heart failure exacerbation due to lack of orthopnea or worsening LE edema.  -DME order for oxygen -Continue Wixela and albuterol

## 2020-06-20 NOTE — Assessment & Plan Note (Signed)
Patient has stage IV sacral ulcer with chronic osteomyelitis.  He was seen by the wound care center at and was redressed.  States that the wound appears noninfected and improved.  -Continue dressing change and follow-up with the wound care center -Patient will refer to ID for his chronic osteomyelitis

## 2020-06-20 NOTE — Assessment & Plan Note (Signed)
His physical condition has worsened after his CVA.  Patient is currently bedbound and is not able to get up and ambulate.  Patient was from [place] but went home with his caretaker due to worsening sacral ulcers.  However his caretaker state that she is unable to care for him.  Patient requires 24-hour care and extensive physical therapy, she cannot provide at home.  -FL2 paperwork filled out for SNF placement

## 2020-06-20 NOTE — Progress Notes (Signed)
[facility] Internal Medicine Residency Telephone Encounter Continuity Care Appointment  HPI:   This telephone encounter was created for [name] on 06/20/2020 for the following purpose/cc shortness of breath.   Past Medical History:  Past Medical History:  Diagnosis Date  . Acute blood loss anemia   . Acute CVA (cerebrovascular accident) ([place]) 08/27/2014  . Acute encephalopathy 11/28/2017  . Acute ischemic stroke ([place])   . AKI (acute kidney injury) ([place])   . Alcohol abuse    H/O  . Alcohol dependence with withdrawal with complication ([place]) 9/67/8938  . Anxiety   . Arterial ischemic stroke, MCA (middle cerebral artery), right, acute ([place])   . Arthritis   . Asthma   . Binocular vision disorder with diplopia 01/03/2013  . CAD (coronary artery disease) 05/27/10   Cath: severe single vessell CAD left cx midportion obtuse marginal 2 to 3.  . Cerebral infarction due to embolism of right middle cerebral artery ([place])   . Cerebral infarction due to stenosis of right carotid artery ([place]) 11/01/2014  . Cerebral thrombosis with cerebral infarction 07/18/2016  . Cerebrovascular accident (CVA) due to stenosis of right carotid artery ([place])   . CHF (congestive heart failure) ([place])   . Closed blow-out fracture of floor of orbit ([place]) 01/19/2013  . COPD (chronic obstructive pulmonary disease) ([place])   . COPD (chronic obstructive pulmonary disease) ([place])   . CVA (cerebral vascular accident) ([place]) 08/26/2014  . Dementia due to another medical condition ([place]) 07/12/2015  . Depression   . Diabetes mellitus, type 2 ([place]) 03/13/2015  . Diastolic CHF, acute on chronic (HCC) 11/01/2014  . DOE (dyspnea on exertion) 11/23/2014  . DVT (deep venous thrombosis) ([place]) 02/22/2014  . Embolic stroke ([place])   . Enophthalmos due to trauma 01/19/2013  . Eyelid lesion 01/24/2013  . Fracture of inferior orbital wall ([place]) 01/03/2013  . Fracture of orbital floor ([place]) 01/03/2013  . GERD (gastroesophageal reflux  disease)   . H/O total knee replacement 10/24/2014  . HCAP (healthcare-associated pneumonia) 10/15/2015  . Headache around the eyes 08/27/2014  . History of DVT (deep vein thrombosis)   . History of DVT of lower extremity   . Hyperkalemia 10/24/2014  . Hyperlipemia   . Hyperlipidemia   . Hypertension   . Hypoalbuminemia due to protein-calorie malnutrition ([place])   . Hypotension   . Labile blood pressure   . Left shoulder pain 09/28/2017  . Lower extremity edema 02/21/2014  . Medial orbital wall fracture (HCC) 01/19/2013  . Myocardial infarction ([place]) 2000  . Orbital fracture ([place]) 12/2012  . OSA on CPAP   . Pain of left eye 01/03/2013  . Peripheral vascular disease, unspecified ([place])    08/20/10 doppler: increase in right ABI post-op. Left ABI stable. S/P bi-fem bypass surgery  . Pneumonia 04/04/2012  . Right middle cerebral artery stroke ([place]) 01/18/2018  . Seizures ([place])   . Shortness of breath   . Stroke (cerebrum) ([place]) 02/06/2015  . Thrombocytopenia ([place])   . Tobacco abuse   . Trichiasis without entropion 04/16/2013   Overview:  LLL central   . Type 2 diabetes mellitus with peripheral neuropathy ([place]) 03/13/2015  . Urinary incontinence 03/14/2015      ROS:  Review of Systems  Constitutional: Negative for chills and fever.  Respiratory: Positive for cough and shortness of breath. Negative for wheezing.   Cardiovascular: Positive for leg swelling. Negative for orthopnea.  Musculoskeletal: Positive for back pain.     Assessment / Plan /  Recommendations:   Please see A&P under problem oriented charting for assessment of the patient's acute and chronic medical conditions.   As always, pt is advised that if symptoms worsen or new symptoms arise, they should go to an urgent care facility or to to ER for further evaluation.   Consent and Medical Decision Making:   Patient discussed with Dr. Jimmye Norman  This is a telephone encounter between [name] and [name] on  06/20/2020 for shortness of breath. The visit was conducted with the patient located at [facility] and [name] at [facility]. The patient's identity was confirmed using their DOB and current address. The patient has consented to being evaluated through a telephone encounter and understands the associated risks (an examination cannot be done and the patient may need to come in for an appointment) / benefits (allows the patient to remain at home, decreasing exposure to coronavirus). I personally spent 30 minutes on medical discussion.

## 2020-06-22 DIAGNOSIS — Z466 Encounter for fitting and adjustment of urinary device: Secondary | ICD-10-CM | POA: Diagnosis not present

## 2020-06-22 DIAGNOSIS — K5900 Constipation, unspecified: Secondary | ICD-10-CM | POA: Diagnosis not present

## 2020-06-22 DIAGNOSIS — R339 Retention of urine, unspecified: Secondary | ICD-10-CM | POA: Diagnosis not present

## 2020-06-22 DIAGNOSIS — M4620 Osteomyelitis of vertebra, site unspecified: Secondary | ICD-10-CM | POA: Diagnosis not present

## 2020-06-22 DIAGNOSIS — E1169 Type 2 diabetes mellitus with other specified complication: Secondary | ICD-10-CM | POA: Diagnosis not present

## 2020-06-22 DIAGNOSIS — I110 Hypertensive heart disease with heart failure: Secondary | ICD-10-CM | POA: Diagnosis not present

## 2020-06-22 DIAGNOSIS — K922 Gastrointestinal hemorrhage, unspecified: Secondary | ICD-10-CM | POA: Diagnosis not present

## 2020-06-22 DIAGNOSIS — Z8673 Personal history of transient ischemic attack (TIA), and cerebral infarction without residual deficits: Secondary | ICD-10-CM | POA: Diagnosis not present

## 2020-06-22 DIAGNOSIS — L89154 Pressure ulcer of sacral region, stage 4: Secondary | ICD-10-CM | POA: Diagnosis not present

## 2020-06-22 DIAGNOSIS — I5030 Unspecified diastolic (congestive) heart failure: Secondary | ICD-10-CM | POA: Diagnosis not present

## 2020-06-22 DIAGNOSIS — I69391 Dysphagia following cerebral infarction: Secondary | ICD-10-CM | POA: Diagnosis not present

## 2020-06-22 DIAGNOSIS — I951 Orthostatic hypotension: Secondary | ICD-10-CM | POA: Diagnosis not present

## 2020-06-22 DIAGNOSIS — Z87440 Personal history of urinary (tract) infections: Secondary | ICD-10-CM | POA: Diagnosis not present

## 2020-06-22 DIAGNOSIS — Z7902 Long term (current) use of antithrombotics/antiplatelets: Secondary | ICD-10-CM | POA: Diagnosis not present

## 2020-06-22 DIAGNOSIS — J449 Chronic obstructive pulmonary disease, unspecified: Secondary | ICD-10-CM | POA: Diagnosis not present

## 2020-06-22 DIAGNOSIS — Z7984 Long term (current) use of oral hypoglycemic drugs: Secondary | ICD-10-CM | POA: Diagnosis not present

## 2020-06-24 ENCOUNTER — Encounter (HOSPITAL_BASED_OUTPATIENT_CLINIC_OR_DEPARTMENT_OTHER): Payer: Medicare Other | Admitting: Internal Medicine

## 2020-06-24 ENCOUNTER — Other Ambulatory Visit: Payer: Self-pay

## 2020-06-24 ENCOUNTER — Telehealth (HOSPITAL_COMMUNITY): Payer: Self-pay

## 2020-06-24 DIAGNOSIS — J9621 Acute and chronic respiratory failure with hypoxia: Secondary | ICD-10-CM | POA: Diagnosis not present

## 2020-06-24 DIAGNOSIS — M8660 Other chronic osteomyelitis, unspecified site: Secondary | ICD-10-CM | POA: Diagnosis not present

## 2020-06-24 DIAGNOSIS — L89153 Pressure ulcer of sacral region, stage 3: Secondary | ICD-10-CM

## 2020-06-24 DIAGNOSIS — I639 Cerebral infarction, unspecified: Secondary | ICD-10-CM | POA: Diagnosis not present

## 2020-06-24 DIAGNOSIS — R0602 Shortness of breath: Secondary | ICD-10-CM | POA: Diagnosis not present

## 2020-06-24 DIAGNOSIS — E119 Type 2 diabetes mellitus without complications: Secondary | ICD-10-CM

## 2020-06-25 ENCOUNTER — Telehealth: Payer: Self-pay

## 2020-06-25 DIAGNOSIS — M8660 Other chronic osteomyelitis, unspecified site: Secondary | ICD-10-CM

## 2020-06-25 DIAGNOSIS — L98429 Non-pressure chronic ulcer of back with unspecified severity: Secondary | ICD-10-CM

## 2020-06-25 MED ORDER — OXYCODONE HCL 5 MG PO TABS: 5.0000 mg | ORAL_TABLET | Freq: Four times a day (QID) | ORAL | 0 refills | Status: DC | PRN

## 2020-06-25 NOTE — Telephone Encounter (Signed)
Pt's caregiver requesting to speak with you regarding FL2 form. Please call back.

## 2020-06-25 NOTE — Telephone Encounter (Signed)
Spoke with patient's caregiver, [name]. States she spoke with patient's Case Worker Psychiatric nurse) today and they still have not received FL2 form. She needs this to be faxed ASAP to [phone] so patient can be placed.

## 2020-06-25 NOTE — Telephone Encounter (Signed)
PT aid stated that he went to the wound care center and  She stated that the area is hurting him .. she is requesting a call back

## 2020-06-25 NOTE — Addendum Note (Signed)
Addended byGaylan Gerold on: 06/25/2020 05:36 PM   Modules accepted: Orders

## 2020-06-25 NOTE — Telephone Encounter (Signed)
Completed FL2 faxed to Maricela Curet at [phone]. [name] made aware and is very Patent attorney. Hubbard Hartshorn, BSN, RN-BC

## 2020-06-25 NOTE — Addendum Note (Signed)
Addended by: Ebbie Latus on: 06/25/2020 04:15 PM   Modules accepted: Orders

## 2020-06-25 NOTE — Addendum Note (Signed)
Addended byGaylan Gerold on: 06/25/2020 09:10 AM   Modules accepted: Orders

## 2020-06-25 NOTE — Telephone Encounter (Signed)
Return call to [facility] - no answer; left message to call the office.

## 2020-06-25 NOTE — Telephone Encounter (Signed)
Return call to [name],pt's caretaker. Stated pt went to the wound ctr yesterday and they "scraped his wound', probably type of debridement. And he has been in a lot of pain since then; stated she gave him Tylenol but it did not help. Stated pt has been screaming in pain. Requesting something for pain be sent to the pharmacy.Oxycodone is on current med list. Thanks

## 2020-06-26 ENCOUNTER — Telehealth: Payer: Self-pay | Admitting: *Deleted

## 2020-06-26 ENCOUNTER — Emergency Department (HOSPITAL_COMMUNITY): Payer: Medicare Other

## 2020-06-26 ENCOUNTER — Encounter: Payer: Medicare Other | Admitting: Internal Medicine

## 2020-06-26 ENCOUNTER — Other Ambulatory Visit: Payer: Self-pay

## 2020-06-26 ENCOUNTER — Inpatient Hospital Stay (HOSPITAL_COMMUNITY)
Admission: EM | Admit: 2020-06-26 | Discharge: 2020-07-09 | DRG: 189 | Disposition: A | Discharge status: Skilled Nursing Facility | Payer: Medicare Other | Attending: Internal Medicine | Admitting: Internal Medicine

## 2020-06-26 ENCOUNTER — Telehealth (HOSPITAL_COMMUNITY): Payer: Self-pay

## 2020-06-26 DIAGNOSIS — M25519 Pain in unspecified shoulder: Secondary | ICD-10-CM

## 2020-06-26 DIAGNOSIS — R5381 Other malaise: Secondary | ICD-10-CM | POA: Diagnosis present

## 2020-06-26 DIAGNOSIS — I110 Hypertensive heart disease with heart failure: Secondary | ICD-10-CM | POA: Diagnosis present

## 2020-06-26 DIAGNOSIS — R6510 Systemic inflammatory response syndrome (SIRS) of non-infectious origin without acute organ dysfunction: Secondary | ICD-10-CM | POA: Diagnosis not present

## 2020-06-26 DIAGNOSIS — R45851 Suicidal ideations: Secondary | ICD-10-CM | POA: Diagnosis present

## 2020-06-26 DIAGNOSIS — Z20822 Contact with and (suspected) exposure to covid-19: Secondary | ICD-10-CM | POA: Diagnosis present

## 2020-06-26 DIAGNOSIS — R0902 Hypoxemia: Secondary | ICD-10-CM | POA: Diagnosis not present

## 2020-06-26 DIAGNOSIS — R6889 Other general symptoms and signs: Secondary | ICD-10-CM | POA: Diagnosis not present

## 2020-06-26 DIAGNOSIS — E1142 Type 2 diabetes mellitus with diabetic polyneuropathy: Secondary | ICD-10-CM | POA: Diagnosis present

## 2020-06-26 DIAGNOSIS — K567 Ileus, unspecified: Secondary | ICD-10-CM

## 2020-06-26 DIAGNOSIS — D649 Anemia, unspecified: Secondary | ICD-10-CM | POA: Diagnosis not present

## 2020-06-26 DIAGNOSIS — Z8616 Personal history of COVID-19: Secondary | ICD-10-CM

## 2020-06-26 DIAGNOSIS — G4700 Insomnia, unspecified: Secondary | ICD-10-CM | POA: Diagnosis present

## 2020-06-26 DIAGNOSIS — Z743 Need for continuous supervision: Secondary | ICD-10-CM | POA: Diagnosis not present

## 2020-06-26 DIAGNOSIS — J441 Chronic obstructive pulmonary disease with (acute) exacerbation: Secondary | ICD-10-CM | POA: Diagnosis not present

## 2020-06-26 DIAGNOSIS — L309 Dermatitis, unspecified: Secondary | ICD-10-CM | POA: Diagnosis present

## 2020-06-26 DIAGNOSIS — K449 Diaphragmatic hernia without obstruction or gangrene: Secondary | ICD-10-CM | POA: Diagnosis not present

## 2020-06-26 DIAGNOSIS — R140 Abdominal distension (gaseous): Secondary | ICD-10-CM

## 2020-06-26 DIAGNOSIS — R0689 Other abnormalities of breathing: Secondary | ICD-10-CM | POA: Diagnosis not present

## 2020-06-26 DIAGNOSIS — Z9981 Dependence on supplemental oxygen: Secondary | ICD-10-CM

## 2020-06-26 DIAGNOSIS — M50121 Cervical disc disorder at C4-C5 level with radiculopathy: Secondary | ICD-10-CM | POA: Diagnosis not present

## 2020-06-26 DIAGNOSIS — Z6830 Body mass index (BMI) 30.0-30.9, adult: Secondary | ICD-10-CM | POA: Diagnosis not present

## 2020-06-26 DIAGNOSIS — M50122 Cervical disc disorder at C5-C6 level with radiculopathy: Secondary | ICD-10-CM | POA: Diagnosis not present

## 2020-06-26 DIAGNOSIS — K219 Gastro-esophageal reflux disease without esophagitis: Secondary | ICD-10-CM | POA: Diagnosis not present

## 2020-06-26 DIAGNOSIS — M6258 Muscle wasting and atrophy, not elsewhere classified, other site: Secondary | ICD-10-CM | POA: Diagnosis not present

## 2020-06-26 DIAGNOSIS — F17290 Nicotine dependence, other tobacco product, uncomplicated: Secondary | ICD-10-CM | POA: Diagnosis not present

## 2020-06-26 DIAGNOSIS — J440 Chronic obstructive pulmonary disease with acute lower respiratory infection: Secondary | ICD-10-CM | POA: Diagnosis not present

## 2020-06-26 DIAGNOSIS — J189 Pneumonia, unspecified organism: Secondary | ICD-10-CM

## 2020-06-26 DIAGNOSIS — R059 Cough, unspecified: Secondary | ICD-10-CM

## 2020-06-26 DIAGNOSIS — E669 Obesity, unspecified: Secondary | ICD-10-CM | POA: Diagnosis present

## 2020-06-26 DIAGNOSIS — M25512 Pain in left shoulder: Secondary | ICD-10-CM | POA: Diagnosis not present

## 2020-06-26 DIAGNOSIS — J9621 Acute and chronic respiratory failure with hypoxia: Secondary | ICD-10-CM | POA: Diagnosis not present

## 2020-06-26 DIAGNOSIS — F32A Depression, unspecified: Secondary | ICD-10-CM | POA: Diagnosis not present

## 2020-06-26 DIAGNOSIS — Z79899 Other long term (current) drug therapy: Secondary | ICD-10-CM

## 2020-06-26 DIAGNOSIS — K5900 Constipation, unspecified: Secondary | ICD-10-CM

## 2020-06-26 DIAGNOSIS — M4802 Spinal stenosis, cervical region: Secondary | ICD-10-CM | POA: Diagnosis present

## 2020-06-26 DIAGNOSIS — L89154 Pressure ulcer of sacral region, stage 4: Secondary | ICD-10-CM | POA: Diagnosis not present

## 2020-06-26 DIAGNOSIS — Z9151 Personal history of suicidal behavior: Secondary | ICD-10-CM

## 2020-06-26 DIAGNOSIS — Z7951 Long term (current) use of inhaled steroids: Secondary | ICD-10-CM

## 2020-06-26 DIAGNOSIS — N390 Urinary tract infection, site not specified: Secondary | ICD-10-CM | POA: Diagnosis present

## 2020-06-26 DIAGNOSIS — D6489 Other specified anemias: Secondary | ICD-10-CM | POA: Diagnosis present

## 2020-06-26 DIAGNOSIS — R0603 Acute respiratory distress: Secondary | ICD-10-CM | POA: Diagnosis not present

## 2020-06-26 DIAGNOSIS — R000 Tachycardia, unspecified: Secondary | ICD-10-CM | POA: Diagnosis not present

## 2020-06-26 DIAGNOSIS — K5909 Other constipation: Secondary | ICD-10-CM | POA: Diagnosis not present

## 2020-06-26 DIAGNOSIS — R0602 Shortness of breath: Secondary | ICD-10-CM | POA: Diagnosis not present

## 2020-06-26 DIAGNOSIS — G4733 Obstructive sleep apnea (adult) (pediatric): Secondary | ICD-10-CM | POA: Diagnosis present

## 2020-06-26 DIAGNOSIS — E1151 Type 2 diabetes mellitus with diabetic peripheral angiopathy without gangrene: Secondary | ICD-10-CM | POA: Diagnosis not present

## 2020-06-26 DIAGNOSIS — L600 Ingrowing nail: Secondary | ICD-10-CM | POA: Diagnosis present

## 2020-06-26 DIAGNOSIS — K529 Noninfective gastroenteritis and colitis, unspecified: Secondary | ICD-10-CM | POA: Diagnosis not present

## 2020-06-26 DIAGNOSIS — Z7401 Bed confinement status: Secondary | ICD-10-CM

## 2020-06-26 DIAGNOSIS — J9601 Acute respiratory failure with hypoxia: Secondary | ICD-10-CM | POA: Diagnosis not present

## 2020-06-26 DIAGNOSIS — I252 Old myocardial infarction: Secondary | ICD-10-CM

## 2020-06-26 DIAGNOSIS — I2699 Other pulmonary embolism without acute cor pulmonale: Secondary | ICD-10-CM | POA: Diagnosis not present

## 2020-06-26 DIAGNOSIS — Z923 Personal history of irradiation: Secondary | ICD-10-CM

## 2020-06-26 DIAGNOSIS — Z85118 Personal history of other malignant neoplasm of bronchus and lung: Secondary | ICD-10-CM

## 2020-06-26 DIAGNOSIS — Z86718 Personal history of other venous thrombosis and embolism: Secondary | ICD-10-CM

## 2020-06-26 DIAGNOSIS — E785 Hyperlipidemia, unspecified: Secondary | ICD-10-CM | POA: Diagnosis present

## 2020-06-26 DIAGNOSIS — I10 Essential (primary) hypertension: Secondary | ICD-10-CM | POA: Diagnosis not present

## 2020-06-26 DIAGNOSIS — J449 Chronic obstructive pulmonary disease, unspecified: Secondary | ICD-10-CM | POA: Diagnosis not present

## 2020-06-26 DIAGNOSIS — R197 Diarrhea, unspecified: Secondary | ICD-10-CM | POA: Diagnosis not present

## 2020-06-26 DIAGNOSIS — J90 Pleural effusion, not elsewhere classified: Secondary | ICD-10-CM | POA: Diagnosis not present

## 2020-06-26 DIAGNOSIS — I5032 Chronic diastolic (congestive) heart failure: Secondary | ICD-10-CM | POA: Diagnosis present

## 2020-06-26 DIAGNOSIS — M5011 Cervical disc disorder with radiculopathy,  high cervical region: Secondary | ICD-10-CM | POA: Diagnosis not present

## 2020-06-26 DIAGNOSIS — R531 Weakness: Secondary | ICD-10-CM | POA: Diagnosis not present

## 2020-06-26 DIAGNOSIS — M19012 Primary osteoarthritis, left shoulder: Secondary | ICD-10-CM | POA: Diagnosis not present

## 2020-06-26 DIAGNOSIS — G8929 Other chronic pain: Secondary | ICD-10-CM | POA: Diagnosis present

## 2020-06-26 DIAGNOSIS — I2510 Atherosclerotic heart disease of native coronary artery without angina pectoris: Secondary | ICD-10-CM | POA: Diagnosis present

## 2020-06-26 DIAGNOSIS — I517 Cardiomegaly: Secondary | ICD-10-CM | POA: Diagnosis not present

## 2020-06-26 DIAGNOSIS — Z8673 Personal history of transient ischemic attack (TIA), and cerebral infarction without residual deficits: Secondary | ICD-10-CM | POA: Diagnosis not present

## 2020-06-26 DIAGNOSIS — E876 Hypokalemia: Secondary | ICD-10-CM | POA: Diagnosis present

## 2020-06-26 DIAGNOSIS — R109 Unspecified abdominal pain: Secondary | ICD-10-CM | POA: Diagnosis not present

## 2020-06-26 DIAGNOSIS — M62838 Other muscle spasm: Secondary | ICD-10-CM | POA: Diagnosis present

## 2020-06-26 DIAGNOSIS — S46012A Strain of muscle(s) and tendon(s) of the rotator cuff of left shoulder, initial encounter: Secondary | ICD-10-CM | POA: Diagnosis not present

## 2020-06-26 DIAGNOSIS — R0600 Dyspnea, unspecified: Secondary | ICD-10-CM | POA: Diagnosis not present

## 2020-06-26 DIAGNOSIS — J9600 Acute respiratory failure, unspecified whether with hypoxia or hypercapnia: Secondary | ICD-10-CM | POA: Diagnosis present

## 2020-06-26 DIAGNOSIS — R300 Dysuria: Secondary | ICD-10-CM | POA: Diagnosis not present

## 2020-06-26 DIAGNOSIS — Z7982 Long term (current) use of aspirin: Secondary | ICD-10-CM

## 2020-06-26 LAB — URINALYSIS, ROUTINE W REFLEX MICROSCOPIC
Bilirubin Urine: NEGATIVE
Glucose, UA: NEGATIVE mg/dL
Ketones, ur: NEGATIVE mg/dL
Nitrite: POSITIVE — AB
Protein, ur: 100 mg/dL — AB
Specific Gravity, Urine: 1.02 (ref 1.005–1.030)
WBC, UA: >50 WBC/hpf — ABNORMAL HIGH (ref 0–5)
pH: 6 (ref 5.0–8.0)

## 2020-06-26 LAB — COMPREHENSIVE METABOLIC PANEL
ALT: 13 U/L (ref 0–44)
AST: 16 U/L (ref 15–41)
Albumin: 2.8 g/dL — ABNORMAL LOW (ref 3.5–5.0)
Alkaline Phosphatase: 64 U/L (ref 38–126)
Anion gap: 11 (ref 5–15)
BUN: 19 mg/dL (ref 8–23)
CO2: 24 mmol/L (ref 22–32)
Calcium: 9.2 mg/dL (ref 8.9–10.3)
Chloride: 105 mmol/L (ref 98–111)
Creatinine, Ser: 0.78 mg/dL (ref 0.61–1.24)
GFR, Estimated: >60 mL/min (ref >60)
Glucose, Bld: 268 mg/dL — ABNORMAL HIGH (ref 70–99)
Potassium: 3.8 mmol/L (ref 3.5–5.1)
Sodium: 140 mmol/L (ref 135–145)
Total Bilirubin: 0.8 mg/dL (ref 0.3–1.2)
Total Protein: 6 g/dL — ABNORMAL LOW (ref 6.5–8.1)

## 2020-06-26 LAB — RESP PANEL BY RT-PCR (FLU A&B, COVID) ARPGX2
Influenza A by PCR: NEGATIVE
Influenza B by PCR: NEGATIVE
SARS Coronavirus 2 by RT PCR: NEGATIVE

## 2020-06-26 LAB — CBC WITH DIFFERENTIAL/PLATELET
Abs Immature Granulocytes: 0.11 10*3/uL — ABNORMAL HIGH (ref 0.00–0.07)
Basophils Absolute: 0 10*3/uL (ref 0.0–0.1)
Basophils Relative: 0 %
Eosinophils Absolute: 0 10*3/uL (ref 0.0–0.5)
Eosinophils Relative: 0 %
HCT: 40 % (ref 39.0–52.0)
Hemoglobin: 12.1 g/dL — ABNORMAL LOW (ref 13.0–17.0)
Immature Granulocytes: 1 %
Lymphocytes Relative: 5 %
Lymphs Abs: 0.7 10*3/uL (ref 0.7–4.0)
MCH: 26.8 pg (ref 26.0–34.0)
MCHC: 30.3 g/dL (ref 30.0–36.0)
MCV: 88.5 fL (ref 80.0–100.0)
Monocytes Absolute: 1 10*3/uL (ref 0.1–1.0)
Monocytes Relative: 6 %
Neutro Abs: 13.2 10*3/uL — ABNORMAL HIGH (ref 1.7–7.7)
Neutrophils Relative %: 88 %
Platelets: 205 10*3/uL (ref 150–400)
RBC: 4.52 MIL/uL (ref 4.22–5.81)
RDW: 14.6 % (ref 11.5–15.5)
WBC: 15.1 10*3/uL — ABNORMAL HIGH (ref 4.0–10.5)
nRBC: 0 % (ref 0.0–0.2)

## 2020-06-26 LAB — BRAIN NATRIURETIC PEPTIDE: B Natriuretic Peptide: 49.5 pg/mL (ref 0.0–100.0)

## 2020-06-26 LAB — D-DIMER, QUANTITATIVE: D-Dimer, Quant: 2.19 ug/mL-FEU — ABNORMAL HIGH (ref 0.00–0.50)

## 2020-06-26 MED ORDER — OXYCODONE HCL 5 MG PO TABS: 5.0000 mg | ORAL_TABLET | Freq: Four times a day (QID) | ORAL | Status: DC | PRN

## 2020-06-26 MED ORDER — SODIUM CHLORIDE 0.9 % IV SOLN
2.0000 g | Freq: Once | INTRAVENOUS | Status: AC
  Administered 2020-06-26: 2 g via INTRAVENOUS
  Filled 2020-06-26: qty 2

## 2020-06-26 MED ORDER — VANCOMYCIN HCL 1750 MG/350ML IV SOLN
1750.0000 mg | Freq: Once | INTRAVENOUS | Status: AC
  Administered 2020-06-26: 1750 mg via INTRAVENOUS
  Filled 2020-06-26: qty 350

## 2020-06-26 MED ORDER — TRIAMCINOLONE ACETONIDE 0.1 % EX CREA
1.0000 "application " | TOPICAL_CREAM | Freq: Two times a day (BID) | CUTANEOUS | Status: DC
  Administered 2020-06-26 – 2020-07-09 (×23): 1 via TOPICAL
  Filled 2020-06-26: qty 15

## 2020-06-26 MED ORDER — VANCOMYCIN HCL 1250 MG/250ML IV SOLN
1250.0000 mg | Freq: Two times a day (BID) | INTRAVENOUS | Status: DC
  Administered 2020-06-27: 1250 mg via INTRAVENOUS
  Filled 2020-06-26 (×2): qty 250

## 2020-06-26 MED ORDER — FUROSEMIDE 10 MG/ML IJ SOLN
40.0000 mg | Freq: Once | INTRAMUSCULAR | Status: AC
  Administered 2020-06-26: 40 mg via INTRAVENOUS
  Filled 2020-06-26: qty 4

## 2020-06-26 MED ORDER — TRAZODONE HCL 50 MG PO TABS
100.0000 mg | ORAL_TABLET | Freq: Every day | ORAL | Status: DC
  Administered 2020-06-26 – 2020-07-03 (×8): 100 mg via ORAL
  Filled 2020-06-26 (×8): qty 2

## 2020-06-26 MED ORDER — PANTOPRAZOLE SODIUM 20 MG PO TBEC
20.0000 mg | DELAYED_RELEASE_TABLET | Freq: Every day | ORAL | Status: DC
  Administered 2020-06-27 – 2020-07-09 (×13): 20 mg via ORAL
  Filled 2020-06-26 (×13): qty 1

## 2020-06-26 MED ORDER — PREGABALIN 25 MG PO CAPS
150.0000 mg | ORAL_CAPSULE | Freq: Two times a day (BID) | ORAL | Status: DC
  Administered 2020-06-26 – 2020-07-09 (×26): 150 mg via ORAL
  Filled 2020-06-26 (×27): qty 2

## 2020-06-26 MED ORDER — DULOXETINE HCL 20 MG PO CPEP
40.0000 mg | ORAL_CAPSULE | Freq: Every day | ORAL | Status: DC
  Administered 2020-06-26 – 2020-07-01 (×6): 40 mg via ORAL
  Filled 2020-06-26 (×7): qty 2

## 2020-06-26 MED ORDER — ACETAMINOPHEN 325 MG PO TABS
650.0000 mg | ORAL_TABLET | ORAL | Status: DC | PRN
  Administered 2020-07-01: 650 mg via ORAL
  Filled 2020-06-26: qty 2

## 2020-06-26 MED ORDER — ENOXAPARIN SODIUM 40 MG/0.4ML IJ SOSY
40.0000 mg | PREFILLED_SYRINGE | INTRAMUSCULAR | Status: DC
  Administered 2020-06-27 – 2020-07-09 (×13): 40 mg via SUBCUTANEOUS
  Filled 2020-06-26 (×13): qty 0.4

## 2020-06-26 MED ORDER — MOMETASONE FURO-FORMOTEROL FUM 200-5 MCG/ACT IN AERO
2.0000 | INHALATION_SPRAY | Freq: Two times a day (BID) | RESPIRATORY_TRACT | Status: DC
  Administered 2020-06-26 – 2020-07-09 (×25): 2 via RESPIRATORY_TRACT
  Filled 2020-06-26: qty 8.8

## 2020-06-26 MED ORDER — ATORVASTATIN CALCIUM 80 MG PO TABS
80.0000 mg | ORAL_TABLET | Freq: Every day | ORAL | Status: DC
  Administered 2020-06-26 – 2020-07-09 (×14): 80 mg via ORAL
  Filled 2020-06-26 (×11): qty 1
  Filled 2020-06-26: qty 2
  Filled 2020-06-26 (×2): qty 1

## 2020-06-26 MED ORDER — MEMANTINE HCL 10 MG PO TABS
10.0000 mg | ORAL_TABLET | Freq: Two times a day (BID) | ORAL | Status: DC
  Administered 2020-06-26 – 2020-07-09 (×26): 10 mg via ORAL
  Filled 2020-06-26 (×26): qty 1

## 2020-06-26 MED ORDER — SODIUM CHLORIDE 0.9 % IV SOLN
2.0000 g | Freq: Three times a day (TID) | INTRAVENOUS | Status: DC
  Administered 2020-06-26 – 2020-06-27 (×3): 2 g via INTRAVENOUS
  Filled 2020-06-26 (×3): qty 2

## 2020-06-26 MED ORDER — SENNOSIDES-DOCUSATE SODIUM 8.6-50 MG PO TABS: 1.0000 | ORAL_TABLET | Freq: Every evening | ORAL | Status: DC | PRN

## 2020-06-26 MED ORDER — ASPIRIN EC 81 MG PO TBEC
81.0000 mg | DELAYED_RELEASE_TABLET | Freq: Every day | ORAL | Status: DC
  Administered 2020-06-27 – 2020-07-09 (×13): 81 mg via ORAL
  Filled 2020-06-26 (×13): qty 1

## 2020-06-26 MED ORDER — METHOCARBAMOL 750 MG PO TABS
750.0000 mg | ORAL_TABLET | Freq: Four times a day (QID) | ORAL | Status: DC
  Administered 2020-06-26 – 2020-06-27 (×3): 750 mg via ORAL
  Filled 2020-06-26: qty 2
  Filled 2020-06-26 (×2): qty 1

## 2020-06-26 MED ORDER — SENNOSIDES-DOCUSATE SODIUM 8.6-50 MG PO TABS: 2.0000 | ORAL_TABLET | Freq: Every evening | ORAL | Status: DC | PRN

## 2020-06-26 NOTE — Progress Notes (Signed)
[name] ([id number]) Visit Report for 06/24/2020 Chief Complaint Document Details Patient Name: Date of Service: [name], [name]. 06/24/2020 3:15 PM Medical Record Number: [id number] Patient Account Number: [id number] Date of Birth/Sex: Treating RN: 1950-04-27 (70 y.o. [name] Primary Care Provider: Becky Sax Other Clinician: Referring Provider: Treating Provider/Extender: Legrand Pitts, Q UA N Weeks in Treatment: 3 Information Obtained from: Patient Chief Complaint Sacral Ulcer Electronic Signature(s) Signed: 06/24/2020 4:41:36 PM By: Kalman Shan DO Entered By: Kalman Shan on 06/24/2020 16:39:42 -------------------------------------------------------------------------------- HPI Details Patient Name: Date of Service: [name], [name]. 06/24/2020 3:15 PM Medical Record Number: [id number] Patient Account Number: [id number] Date of Birth/Sex: Treating RN: Feb 13, 1950 (70 y.o. [name] Primary Care Provider: Becky Sax Other Clinician: Referring Provider: Treating Provider/Extender: Legrand Pitts, Q UA N Weeks in Treatment: 3 History of Present Illness HPI Description: [name] is a 70 year old male with a past medical history of CVA, type 2 diabetes and chronic osteomyelitis that presents the clinic today for a sacral ulcer. Patient was hospitalized on 04/12/2020 for abdominal pain found to have a GI bleed. At that time he was noted to have a stage IV decubitus ulcer. He had a CT scan that showed chronic osteomyelitis of the sacrum. He has not had treatment for this. Today he states he feels well. He resides in a nursing facility and has difficulty with mobility. This has been an ongoing wound for the patient over several years. He denies any fever/chills, nausea/vomiting, purulent drainage or increased warmth or erythema to the wound. 5/16; patient presents for 3-week follow-up. He has been using silver  alginate every other day to the wound. He denies pain or signs of infection. He has an infectious disease consult in 1 week. Electronic Signature(s) Signed: 06/24/2020 4:41:36 PM By: Kalman Shan DO Entered By: Kalman Shan on 06/24/2020 16:39:50 -------------------------------------------------------------------------------- Physical Exam Details Patient Name: Date of Service: [name], [name]. 06/24/2020 3:15 PM Medical Record Number: [id number] Patient Account Number: [id number] Date of Birth/Sex: Treating RN: [birth year] (70 y.o. [name] Primary Care Provider: Becky Sax Other Clinician: Referring Provider: Treating Provider/Extender: Legrand Pitts, Q UA N Weeks in Treatment: 3 Constitutional respirations regular, non-labored and within target range for patient.[name] Psychiatric pleasant and cooperative. Notes Sacrum: Overall the wound bed looks healthy. There is no necrotic tissue. There are no signs of infection. Surrounding skin is intact. Electronic Signature(s) Signed: 06/24/2020 4:41:36 PM By: Kalman Shan DO Entered By: Kalman Shan on 06/24/2020 16:40:10 -------------------------------------------------------------------------------- Physician Orders Details Patient Name: Date of Service: [name], [name]. 06/24/2020 3:15 PM Medical Record Number: [id number] Patient Account Number: [id number] Date of Birth/Sex: Treating RN: 28-Sep-1950 (70 y.o. Erie Noe Primary Care Provider: Jonah Blue UA N Other Clinician: Referring Provider: Treating Provider/Extender: Legrand Pitts, Q UA N Weeks in Treatment: 3 Verbal / Phone Orders: No Diagnosis Coding ICD-10 Coding Code Description L89.153 Pressure ulcer of sacral region, stage 3 M86.60 Other chronic osteomyelitis, unspecified site I63.9 Cerebral infarction, unspecified E11.9 Type 2 diabetes mellitus without complications Follow-up Appointments Return appointment  in 3 weeks. Bathing/ Shower/ Hygiene May shower and wash wound with soap and water. Off-Loading Turn and reposition every 2 hours [place] wound care orders this week; continue Home Health for wound care. May utilize formulary equivalent dressing for wound treatment orders unless otherwise specified. Other Home Health Orders/Instructions: - [facility] Wound Treatment Wound #  1 - Sacrum Peri-Wound Care: Skin Prep (Home Health) Every Other Day/15 Days Discharge Instructions: Use skin prep as directed Prim Dressing: KerraCel Ag Gelling Fiber Dressing, 4x5 in (silver alginate) (Home Health) Every Other Day/15 Days ary Discharge Instructions: Apply silver alginate to wound bed, line wound bed with alginate and then pack lightly to fil space Secondary Dressing: Woven Gauze Sponge, Non-Sterile 4x4 in [facility]) Every Other Day/15 Days Discharge Instructions: Apply over primary dressing as directed. Secured With: Merchandiser, retail Dressing 4x4.75 (in/in) (Home Health) Every Other Day/15 Days Discharge Instructions: Secure dressing with Tegaderm as directed. Electronic Signature(s) Signed: 06/24/2020 4:41:36 PM By: Kalman Shan DO Entered By: Kalman Shan on 06/24/2020 16:40:22 -------------------------------------------------------------------------------- Problem List Details Patient Name: Date of Service: [name], [name]. 06/24/2020 3:15 PM Medical Record Number: [id number] Patient Account Number: [id number] Date of Birth/Sex: Treating RN: Jan 31, 1951 (70 y.o. [name] Primary Care Provider: Becky Sax Other Clinician: Referring Provider: Treating Provider/Extender: Legrand Pitts, Q UA N Weeks in Treatment: 3 Active Problems ICD-10 Encounter Code Description Active Date MDM Diagnosis L89.153 Pressure ulcer of sacral region, stage 3 06/06/2020 No Yes M86.60 Other chronic osteomyelitis, unspecified site 06/03/2020 No Yes I63.9 Cerebral  infarction, unspecified 06/03/2020 No Yes E11.9 Type 2 diabetes mellitus without complications 2/94/7654 No Yes Inactive Problems Resolved Problems Electronic Signature(s) Signed: 06/24/2020 4:41:36 PM By: Kalman Shan DO Entered By: Kalman Shan on 06/24/2020 16:39:29 -------------------------------------------------------------------------------- Progress Note Details Patient Name: Date of Service: [name], [name]. 06/24/2020 3:15 PM Medical Record Number: [id number] Patient Account Number: [id number] Date of Birth/Sex: Treating RN: 07-25-1950 (70 y.o. [name] Primary Care Provider: Becky Sax Other Clinician: Referring Provider: Treating Provider/Extender: Legrand Pitts, Q UA N Weeks in Treatment: 3 Subjective Chief Complaint Information obtained from Patient Sacral Ulcer History of Present Illness (HPI) [name] is a 70 year old male with a past medical history of CVA, type 2 diabetes and chronic osteomyelitis that presents the clinic today for a sacral ulcer. Patient was hospitalized on 04/12/2020 for abdominal pain found to have a GI bleed. At that time he was noted to have a stage IV decubitus ulcer. He had a CT scan that showed chronic osteomyelitis of the sacrum. He has not had treatment for this. Today he states he feels well. He resides in a nursing facility and has difficulty with mobility. This has been an ongoing wound for the patient over several years. He denies any fever/chills, nausea/vomiting, purulent drainage or increased warmth or erythema to the wound. 5/16; patient presents for 3-week follow-up. He has been using silver alginate every other day to the wound. He denies pain or signs of infection. He has an infectious disease consult in 1 week. Patient History Information obtained from Patient. Family History Cancer - Siblings, Heart Disease, Hypertension, Stroke, No family history of Diabetes, Hereditary Spherocytosis,  Kidney Disease, Lung Disease, Seizures, Thyroid Problems, Tuberculosis. Social History Former smoker, Marital Status - Single, Alcohol Use - Never - prior history, Drug Use - No History, Caffeine Use - Moderate. Medical History Eyes Denies history of Cataracts, Glaucoma, Optic Neuritis Ear/Nose/Mouth/Throat Denies history of Chronic sinus problems/congestion, Middle ear problems Hematologic/Lymphatic Patient has history of Anemia Denies history of Hemophilia, Human Immunodeficiency Virus, Lymphedema, Sickle Cell Disease Respiratory Patient has history of Asthma, Chronic Obstructive Pulmonary Disease (COPD) Denies history of Aspiration, Pneumothorax, Sleep Apnea, Tuberculosis Cardiovascular Patient has history of Congestive Heart Failure, Coronary Artery Disease, Hypertension, Hypotension, Myocardial Infarction -  hx, Peripheral Arterial Disease, Peripheral Venous Disease Denies history of Angina, Arrhythmia, Deep Vein Thrombosis, Phlebitis, Vasculitis Gastrointestinal Patient has history of Cirrhosis Denies history of Colitis, Crohnoos, Hepatitis A, Hepatitis B, Hepatitis C Endocrine Denies history of Type I Diabetes, Type II Diabetes Genitourinary Denies history of End Stage Renal Disease Immunological Denies history of Lupus Erythematosus, Raynaudoos, Scleroderma Integumentary (Skin) Denies history of History of Burn Musculoskeletal Patient has history of Rheumatoid Arthritis, Osteoarthritis Denies history of Gout, Osteomyelitis Neurologic Patient has history of Dementia Denies history of Neuropathy, Quadriplegia, Paraplegia, Seizure Disorder Oncologic Denies history of Received Chemotherapy, Received Radiation Psychiatric Denies history of Anorexia/bulimia, Confinement Anxiety Medical A Surgical History Notes nd Genitourinary urinary incontincence with urinary catheter Objective Constitutional respirations regular, non-labored and within target range for  patient.. Vitals Time Taken: 3:44 PM, Height: 72 in, Weight: 238 lbs, BMI: 32.3, Temperature: 97.4 F, Pulse: 120 bpm, Respiratory Rate: 17 breaths/min, Blood Pressure: 134/86 mmHg. Psychiatric pleasant and cooperative. General Notes: Sacrum: Overall the wound bed looks healthy. There is no necrotic tissue. There are no signs of infection. Surrounding skin is intact. Integumentary (Hair, Skin) Wound #1 status is Open. Original cause of wound was Gradually Appeared. The date acquired was: 05/10/2017. The wound has been in treatment 3 weeks. The wound is located on the Sacrum. The wound measures 3cm length x 3cm width x 1.2cm depth; 7.069cm^2 area and 8.482cm^3 volume. There is Fat Layer (Subcutaneous Tissue) exposed. There is no tunneling noted, however, there is undermining starting at 7:00 and ending at 4:00 with a maximum distance of 2.5cm. There is a large amount of purulent drainage noted. The wound margin is distinct with the outline attached to the wound base. There is large (67-100%) red, pink granulation within the wound bed. There is no necrotic tissue within the wound bed. Assessment Active Problems ICD-10 Pressure ulcer of sacral region, stage 3 Other chronic osteomyelitis, unspecified site Cerebral infarction, unspecified Type 2 diabetes mellitus without complications Patients wound has shown improvement in size and appearance. There is healthy granulation tissue present. I would like to continue with silver alginate every other day as this seems to be helping. He has an appointment with infectious disease on 5/23 to discuss management of his chronic osteomyelitis. He would like to follow-up in 3 weeks and I think this is reasonable he can be seen sooner if there are any concerns. Plan Follow-up Appointments: Return appointment in 3 weeks. Bathing/ Shower/ Hygiene: May shower and wash wound with soap and water. Off-Loading: Turn and reposition every 2 hours Home Health: New  wound care orders this week; continue Home Health for wound care. May utilize formulary equivalent dressing for wound treatment orders unless otherwise specified. Other Home Health Orders/Instructions: - [facility] WOUND #1: - Sacrum Wound Laterality: Peri-Wound Care: Skin Prep (Home Health) Every Other Day/15 Days Discharge Instructions: Use skin prep as directed Prim Dressing: KerraCel Ag Gelling Fiber Dressing, 4x5 in (silver alginate) (Home Health) Every Other Day/15 Days ary Discharge Instructions: Apply silver alginate to wound bed, line wound bed with alginate and then pack lightly to fil space Secondary Dressing: Woven Gauze Sponge, Non-Sterile 4x4 in [facility]) Every Other Day/15 Days Discharge Instructions: Apply over primary dressing as directed. Secured With: T egaderm Transparent Film Dressing 4x4.75 (in/in) (Home Health) Every Other Day/15 Days Discharge Instructions: Secure dressing with T egaderm as directed. 1. Silver alginate every other day 2. Aggressive offloading Electronic Signature(s) Signed: 06/24/2020 4:41:36 PM By: Kalman Shan DO Entered By: Kalman Shan on 06/24/2020  16:40:44 -------------------------------------------------------------------------------- HxROS Details Patient Name: Date of Service: [name], [name]. 06/24/2020 3:15 PM Medical Record Number: [id number] Patient Account Number: [id number] Date of Birth/Sex: Treating RN: 1950-05-21 (70 y.o. [name] Primary Care Provider: Becky Sax Other Clinician: Referring Provider: Treating Provider/Extender: Legrand Pitts, Q UA N Weeks in Treatment: 3 Information Obtained From Patient Eyes Medical History: Negative for: Cataracts; Glaucoma; Optic Neuritis Ear/Nose/Mouth/Throat Medical History: Negative for: Chronic sinus problems/congestion; Middle ear problems Hematologic/Lymphatic Medical History: Positive for: Anemia Negative for: Hemophilia; Human  Immunodeficiency Virus; Lymphedema; Sickle Cell Disease Respiratory Medical History: Positive for: Asthma; Chronic Obstructive Pulmonary Disease (COPD) Negative for: Aspiration; Pneumothorax; Sleep Apnea; Tuberculosis Cardiovascular Medical History: Positive for: Congestive Heart Failure; Coronary Artery Disease; Hypertension; Hypotension; Myocardial Infarction - hx; Peripheral Arterial Disease; Peripheral Venous Disease Negative for: Angina; Arrhythmia; Deep Vein Thrombosis; Phlebitis; Vasculitis Gastrointestinal Medical History: Positive for: Cirrhosis Negative for: Colitis; Crohns; Hepatitis A; Hepatitis B; Hepatitis C Endocrine Medical History: Negative for: Type I Diabetes; Type II Diabetes Genitourinary Medical History: Negative for: End Stage Renal Disease Past Medical History Notes: urinary incontincence with urinary catheter Immunological Medical History: Negative for: Lupus Erythematosus; Raynauds; Scleroderma Integumentary (Skin) Medical History: Negative for: History of Burn Musculoskeletal Medical History: Positive for: Rheumatoid Arthritis; Osteoarthritis Negative for: Gout; Osteomyelitis Neurologic Medical History: Positive for: Dementia Negative for: Neuropathy; Quadriplegia; Paraplegia; Seizure Disorder Oncologic Medical History: Negative for: Received Chemotherapy; Received Radiation Psychiatric Medical History: Negative for: Anorexia/bulimia; Confinement Anxiety Immunizations Pneumococcal Vaccine: Received Pneumococcal Vaccination: No Implantable Devices None Family and Social History Cancer: Yes - Siblings; Diabetes: No; Heart Disease: Yes; Hereditary Spherocytosis: No; Hypertension: Yes; Kidney Disease: No; Lung Disease: No; Seizures: No; Stroke: Yes; Thyroid Problems: No; Tuberculosis: No; Former smoker; Marital Status - Single; Alcohol Use: Never - prior history; Drug Use: No History; Caffeine Use: Moderate; Financial Concerns: No; Food,  Clothing or Shelter Needs: No; Support System Lacking: No; Transportation Concerns: No Electronic Signature(s) Signed: 06/24/2020 4:41:36 PM By: Kalman Shan DO Signed: 06/26/2020 6:30:22 PM By: Levan Hurst RN, BSN Entered By: Kalman Shan on 06/24/2020 16:39:58 -------------------------------------------------------------------------------- SuperBill Details Patient Name: Date of Service: [name], [name]. 06/24/2020 Medical Record Number: [id number] Patient Account Number: [id number] Date of Birth/Sex: Treating RN: 04/25/1950 (70 y.o. Erie Noe Primary Care Provider: Jonah Blue UA N Other Clinician: Referring Provider: Treating Provider/Extender: Legrand Pitts, Q UA N Weeks in Treatment: 3 Diagnosis Coding ICD-10 Codes Code Description L89.153 Pressure ulcer of sacral region, stage 3 M86.60 Other chronic osteomyelitis, unspecified site I63.9 Cerebral infarction, unspecified E11.9 Type 2 diabetes mellitus without complications Facility Procedures CPT4 Code: [id number] Description: 99213 - WOUND CARE VISIT-LEV 3 EST PT Modifier: Quantity: 1 Physician Procedures : CPT4 Code Description Modifier [id number] - WC PHYS LEVEL 3 - EST PT ICD-10 Diagnosis Description L89.153 Pressure ulcer of sacral region, stage 3 M86.60 Other chronic osteomyelitis, unspecified site I63.9 Cerebral infarction, unspecified E11.9 Type  2 diabetes mellitus without complications Quantity: 1 Electronic Signature(s) Signed: 06/24/2020 4:41:36 PM By: Kalman Shan DO Entered By: Kalman Shan on 06/24/2020 16:41:01

## 2020-06-26 NOTE — Telephone Encounter (Signed)
Demetria, RN with [facility], called to report that patient's care giver called her stating patient is retaining fluid,and having difficulty breathing. Relief with inhaler but then sx return. Patient prefers lying flat and will only tolerate HOB elevated for 15 minutes at a time. States BP "normal for him" and P 135 and 111. No pulse ox in house. States patient has taken all meds today including lasix.   Call placed to [facility], Care giver. States at 2:30 AM patient was having difficulty breathing; BP 152/91 P 135 . She gave him 2 puffs of albuterol and wixela. 30 minutes ago P 111. States he has a lot of fluid built up in both ankles. She feels he needs to be in hospital but patient is refusing. Instructed [name] to call 911 for EMS to assess and transport to hospital if needed. Patient has been in the process of being placed in SNF. [name] feels he needs 24 hour care and will request patient be d/c to SNF instead of home.

## 2020-06-26 NOTE — ED Triage Notes (Signed)
Pt arrived via [facility] with c/c of Respiratory Distress. Per EMS pt is from home with shob last night worsen this AM. Pt had increased edema in abd & Bilateral extremities. . Pt arrived on C-Pap. Hxof COPD, CHF, Lung CX. Denies Fever.  89%RA --> 98% NRB, 98/58,

## 2020-06-26 NOTE — Social Work (Signed)
CSW received call from Ptr's aregiver, [name], who wished to state her concerns that she is unable to continue providing care for Pt.  Ms. Juanita Craver also states that she has been working with DSS social worker to place Pt.

## 2020-06-26 NOTE — ED Notes (Signed)
This RN able to get patient off 15L NRB. Patient now on 5 L [place] sat at 98-99%

## 2020-06-26 NOTE — H&P (Addendum)
Date: 06/26/2020               Patient Name:  [name] MRN: [id number]  DOB: 1950-02-18 Age / Sex: 70 y.o., male   PCP: Gaylan Gerold, DO              Medical Service: Internal Medicine Teaching Service              Attending Physician: Dr. Jimmye Norman, Elaina Pattee, MD    First Contact: Ethelene Hal, MS3 Pager: [phone]  Second Contact: Dr. Iona Beard Pager: [phone]  Third Contact Dr. Maudie Mercury Pager: [phone]       After Hours (After 5p/  First Contact Pager: [phone]  weekends / holidays): Second Contact Pager: [phone]   Chief Complaint: Dyspnea  History of Present Illness:  [name] is a 70 y.o. male with a PMH of CVA, asthma, CAD, CHF, COPD, DVT who presents for evaluation of dyspnea. He stated he began to feel very short of breath last night. He was laying in his bed and trying to sleep. He states that he was laying down flat. He has been able to breathe better here in the hospital while having his head elevated. He also endorses a cough productive of sputum. He states that he typically has shortness of breath due to his presumed COPD with sputum production, but these symptoms are worse than normal. His heartbeat was "very fast," and that also prompted him to come to the ED. He has not had any recent illness or known contacts with similar symptoms. He denies fever, nausea, and vomiting. He does endorse alternating diarrhea and constipation of 2 weeks.   Patient states that his caregiver, [name], has been trying to find him another facility to go to for care. He said he left his [facility] facility, [facility], due to worsening sacral ulcers. His caregiver has also contacted CSW to express concerns with continuing to care for Mr. Bossier. She also stated that she has been working with [place] worker to place patient in a care facility.  ED Course: Patient had SpO2 mid 70% on room air per EMS. Once they arrived on scene, his saturation was 89% on  room air. Saturation improved to 96% on CPAP and then 15L nonrebreather mask prior to ED arrival. Patient was transitioned to 5L nasal cannula with SpO2 98-99% in the ED.  Meds: Current Meds  Medication Sig  . aspirin EC 81 MG tablet Take 1 tablet (81 mg total) by mouth daily. Swallow whole.  [name] atorvastatin (LIPITOR) 80 MG tablet Take 1 tablet (80 mg total) by mouth daily at 6 PM.  . Chlorhexidine Gluconate Cloth 2 % PADS APPLY 6 EACH TOPICALLY DAILY. (Patient taking differently: Apply 6 each topically daily.)  . DULoxetine (CYMBALTA) 20 MG capsule TAKE 2 CAPSULES (40 MG TOTAL) BY MOUTH AT BEDTIME. (Patient taking differently: Take 40 mg by mouth at bedtime.)  . ferrous sulfate 324 MG TBEC Take 324 mg by mouth daily.  . furosemide (LASIX) 40 MG tablet TAKE 1 TABLET BY MOUTH (40MG  TOTAL) EVERY DAY (Patient taking differently: Take 40 mg by mouth daily.)  . memantine (NAMENDA) 10 MG tablet Take 1 tablet (10 mg total) by mouth 2 (two) times daily.  . methocarbamol (ROBAXIN) 750 MG tablet TAKE 1 TABLET (750 MG TOTAL) BY MOUTH FOUR TIMES DAILY. (Patient taking differently: Take 750 mg by mouth 4 (four) times daily.)  . Multiple Vitamin (MULTIVITAMIN ADULT PO) Take 1 tablet by  mouth daily.  [name] oxyCODONE (OXY IR/ROXICODONE) 5 MG immediate release tablet Take 1 tablet (5 mg total) by mouth every 6 (six) hours as needed for up to 3 days for severe pain. (Patient taking differently: Take 5 mg by mouth every 4 (four) hours as needed for severe pain.)  . pantoprazole (PROTONIX) 20 MG tablet Take 1 tablet (20 mg total) by mouth daily.  . potassium chloride SA (KLOR-CON) 20 MEQ tablet TAKE 1 TABLET (20 MEQ TOTAL) BY MOUTH DAILY. (Patient taking differently: Take 20 mEq by mouth daily.)  . pregabalin (LYRICA) 150 MG capsule Take 1 capsule (150 mg total) by mouth 2 (two) times daily.  [name] senna-docusate (SENOKOT-S) 8.6-50 MG tablet TAKE 2 TABLETS BY MOUTH AT BEDTIME. (Patient taking differently: Take 2 tablets by  mouth at bedtime as needed for mild constipation.)  . sodium hypochlorite (DAKIN'S 1/2 STRENGTH) external solution Irrigate with 1 application as directed See admin instructions. Apply to sacral pressure wound bid  . traZODone (DESYREL) 100 MG tablet Take 1 tablet (100 mg total) by mouth at bedtime.  . triamcinolone (KENALOG) 0.1 % Apply 1 application topically every 12 (twelve) hours. Legs, itching  . WIXELA INHUB 250-50 MCG/DOSE AEPB Inhale 1-2 puffs into the lungs daily.   Allergies: Allergies as of 06/26/2020 - Review Complete 06/26/2020  Allergen Reaction Noted  . Darvocet [propoxyphene n-acetaminophen] Hives 08/28/2010  . Haldol [haloperidol decanoate] Hives 08/28/2010  . Acetaminophen Nausea Only 11/01/2014  . Metformin and related Nausea And Vomiting 10/15/2015  . Norco [hydrocodone-acetaminophen] Nausea And Vomiting 12/07/2014   Past Medical History:  Diagnosis Date  . Acute blood loss anemia   . Acute CVA (cerebrovascular accident) ([place]) 08/27/2014  . Acute encephalopathy 11/28/2017  . Acute ischemic stroke ([place])   . AKI (acute kidney injury) ([place])   . Alcohol abuse    H/O  . Alcohol dependence with withdrawal with complication ([place]) 6/43/3295  . Anxiety   . Arterial ischemic stroke, MCA (middle cerebral artery), right, acute ([place])   . Arthritis   . Asthma   . Binocular vision disorder with diplopia 01/03/2013  . CAD (coronary artery disease) 05/27/10   Cath: severe single vessell CAD left cx midportion obtuse marginal 2 to 3.  . Cerebral infarction due to embolism of right middle cerebral artery ([place])   . Cerebral infarction due to stenosis of right carotid artery ([place]) 11/01/2014  . Cerebral thrombosis with cerebral infarction 07/18/2016  . Cerebrovascular accident (CVA) due to stenosis of right carotid artery ([place])   . CHF (congestive heart failure) ([place])   . Closed blow-out fracture of floor of orbit ([place]) 01/19/2013  . COPD (chronic obstructive pulmonary disease) ([place])    . COPD (chronic obstructive pulmonary disease) ([place])   . CVA (cerebral vascular accident) ([place]) 08/26/2014  . Dementia due to another medical condition ([place]) 07/12/2015  . Depression   . Diabetes mellitus, type 2 ([place]) 03/13/2015  . Diastolic CHF, acute on chronic (HCC) 11/01/2014  . DOE (dyspnea on exertion) 11/23/2014  . DVT (deep venous thrombosis) ([place]) 02/22/2014  . Embolic stroke ([place])   . Enophthalmos due to trauma 01/19/2013  . Eyelid lesion 01/24/2013  . Fracture of inferior orbital wall ([place]) 01/03/2013  . Fracture of orbital floor ([place]) 01/03/2013  . GERD (gastroesophageal reflux disease)   . H/O total knee replacement 10/24/2014  . HCAP (healthcare-associated pneumonia) 10/15/2015  . Headache around the eyes 08/27/2014  . History of DVT (deep vein thrombosis)   . History of DVT of  lower extremity   . Hyperkalemia 10/24/2014  . Hyperlipemia   . Hyperlipidemia   . Hypertension   . Hypoalbuminemia due to protein-calorie malnutrition ([place])   . Hypotension   . Labile blood pressure   . Left shoulder pain 09/28/2017  . Lower extremity edema 02/21/2014  . Medial orbital wall fracture (HCC) 01/19/2013  . Myocardial infarction ([place]) 2000  . Orbital fracture ([place]) 12/2012  . OSA on CPAP   . Pain of left eye 01/03/2013  . Peripheral vascular disease, unspecified ([place])    08/20/10 doppler: increase in right ABI post-op. Left ABI stable. S/P bi-fem bypass surgery  . Pneumonia 04/04/2012  . Right middle cerebral artery stroke ([place]) 01/18/2018  . Seizures ([place])   . Shortness of breath   . Stroke (cerebrum) ([place]) 02/06/2015  . Thrombocytopenia ([place])   . Tobacco abuse   . Trichiasis without entropion 04/16/2013   Overview:  LLL central   . Type 2 diabetes mellitus with peripheral neuropathy ([place]) 03/13/2015  . Urinary incontinence 03/14/2015   Family History:  Patient is unsure of family history. On chart review: Mother, deceased, MI and CHF Father, deceased, cirrhosis Brother, living,  CHF Brother, living, cancer unspecified  Social History: Patient lives at home in [place] with his aide. He used to live at [facility] but left with aide after worsening sacral ulcers. He smoked 2 packs of cigarettes for 50 years but stopped for unclear reasons. He denies any alcohol use. He denies any other drug use aside from acid and cocaine as a teenager.  Review of Systems: A complete ROS was negative except as per HPI.  Physical Exam: Blood pressure 137/84, pulse 69, temperature 99.7 F (37.6 C), temperature source Oral, resp. rate (!) 25, height 6' (1.829 m), weight 105.8 kg, SpO2 90 %.  Physical Exam Constitutional:      Comments: Patient is sitting in bed, slightly turned to his right, with oxygen mask over mouth and nose. He appears uncomfortable.  Eyes:     Extraocular Movements: Extraocular movements intact.  Cardiovascular:     Rate and Rhythm: Tachycardia present.     Pulses:          Dorsalis pedis pulses are 1+ on the right side and 0 on the left side.     Comments: Heart sounds difficult to appreciate due to body habitus. Pulmonary:     Comments: Difficult to speak in long sentences due to SOB. Coarse rales present in all anterior and posterior lung fields. Difficult to take deep breaths. Intermittent coughing productive of phlegm. Abdominal:     General: Abdomen is protuberant. Bowel sounds are normal. There is distension.     Tenderness: There is generalized abdominal tenderness.     Comments: Abdomen slightly firm to palpation.  Musculoskeletal:     Right lower leg: 1+ Pitting Edema present.     Left lower leg: 1+ Pitting Edema present.  Feet:     Right foot:     Toenail Condition: Right toenails are abnormally thick and long.     Left foot:     Toenail Condition: Left toenails are abnormally thick and long.     Comments: Ingrown toenail on L great toe. Skin:    Comments: Generalized dryness over legs bilaterally. Small crusted lesion  anterolaterally on right lower leg. Large decubitus ulcer midline.  Neurological:     Mental Status: He is alert.     Sensory: Sensation is intact.     Comments: Strength 1/5  LUE and 4/5 RUE. LLE 0/5 strength. RLE 3/5 strength.  Psychiatric:        Attention and Perception: Attention normal.        Behavior: Behavior is cooperative.     Comments: Speech muffled from NRB mask.    Recent Results (from the past 2160 hour(s))  Comprehensive metabolic panel     Status: Abnormal   Collection Time: 06/26/20 11:39 AM  Result Value Ref Range   Sodium 140 135 - 145 mmol/L   Potassium 3.8 3.5 - 5.1 mmol/L   Chloride 105 98 - 111 mmol/L   CO2 24 22 - 32 mmol/L   Glucose, Bld 268 (H) 70 - 99 mg/dL    Comment: Glucose reference range applies only to samples taken after fasting for at least 8 hours.   BUN 19 8 - 23 mg/dL   Creatinine, Ser 0.78 0.61 - 1.24 mg/dL   Calcium 9.2 8.9 - 10.3 mg/dL   Total Protein 6.0 (L) 6.5 - 8.1 g/dL   Albumin 2.8 (L) 3.5 - 5.0 g/dL   AST 16 15 - 41 U/L   ALT 13 0 - 44 U/L   Alkaline Phosphatase 64 38 - 126 U/L   Total Bilirubin 0.8 0.3 - 1.2 mg/dL   GFR, Estimated >60 >60 mL/min    Comment: (NOTE) Calculated using the CKD-EPI Creatinine Equation (2021)    Anion gap 11 5 - 15    Comment: Performed at [facility], [place] [address], [place], [place] [address]  CBC WITH DIFFERENTIAL     Status: Abnormal   Collection Time: 06/26/20 11:39 AM  Result Value Ref Range   WBC 15.1 (H) 4.0 - 10.5 K/uL   RBC 4.52 4.22 - 5.81 MIL/uL   Hemoglobin 12.1 (L) 13.0 - 17.0 g/dL   HCT 40.0 39.0 - 52.0 %   MCV 88.5 80.0 - 100.0 fL   MCH 26.8 26.0 - 34.0 pg   MCHC 30.3 30.0 - 36.0 g/dL   RDW 14.6 11.5 - 15.5 %   Platelets 205 150 - 400 K/uL   nRBC 0.0 0.0 - 0.2 %   Neutrophils Relative % 88 %   Neutro Abs 13.2 (H) 1.7 - 7.7 K/uL   Lymphocytes Relative 5 %   Lymphs Abs 0.7 0.7 - 4.0 K/uL   Monocytes Relative 6 %   Monocytes Absolute 1.0 0.1 - 1.0 K/uL   Eosinophils  Relative 0 %   Eosinophils Absolute 0.0 0.0 - 0.5 K/uL   Basophils Relative 0 %   Basophils Absolute 0.0 0.0 - 0.1 K/uL   Immature Granulocytes 1 %   Abs Immature Granulocytes 0.11 (H) 0.00 - 0.07 K/uL    Comment: Performed at [facility], [address], [place], [place] [address]  Brain natriuretic peptide     Status: None   Collection Time: 06/26/20 11:39 AM  Result Value Ref Range   B Natriuretic Peptide 49.5 0.0 - 100.0 pg/mL    Comment: Performed at [facility] [address], [place], [place] [address]  Urinalysis, Routine w reflex microscopic Urine, Catheterized     Status: Abnormal   Collection Time: 06/26/20 12:44 PM  Result Value Ref Range   Color, Urine AMBER (A) YELLOW    Comment: BIOCHEMICALS MAY BE AFFECTED BY COLOR   APPearance CLOUDY (A) CLEAR   Specific Gravity, Urine 1.020 1.005 - 1.030   pH 6.0 5.0 - 8.0   Glucose, UA NEGATIVE NEGATIVE mg/dL  Hgb urine dipstick SMALL (A) NEGATIVE   Bilirubin Urine NEGATIVE NEGATIVE   Ketones, ur NEGATIVE NEGATIVE mg/dL   Protein, ur 100 (A) NEGATIVE mg/dL   Nitrite POSITIVE (A) NEGATIVE   Leukocytes,Ua LARGE (A) NEGATIVE   RBC / HPF 21-50 0 - 5 RBC/hpf   WBC, UA >50 (H) 0 - 5 WBC/hpf   Bacteria, UA MANY (A) NONE SEEN   Squamous Epithelial / LPF 0-5 0 - 5   WBC Clumps PRESENT    Amorphous Crystal PRESENT    Ca Oxalate Crys, UA PRESENT     Comment: Performed at [facility], [place] [address] [place], [place] [id number]   EKG: sinus tachycardia, single PAC  CXR: IMPRESSION: No significant change in bilateral pulmonary opacities over the last 2 months, felt to reflect scarring on CT. No definite acute findings. Stable cardiomegaly.  Assessment & Plan by Problem: Active Problems:   Acute respiratory failure ([place])  1. Acute respiratory failure Patient states that he can breathe better here in the hospital. His SpO2 has markedly improved in the ED to 98-99% on 5L nasal cannula from mid-70% on room air  at home. WBC 15.1. BNP is normal at 49.5. Respiratory panel is negative. Physical exam demonstrates marked coarse rales. Patient is immobile since having a CVA in 2016, leaving his LUE and LLE very weak. Differential at this time includes healthcare-associated pneumonia vs. COPD exacerbation vs. pulmonary embolism. IV vancomycin 1250 mg and IV cefepime 2g started for pneumonia empirically. -Will get D-dimer to evaluate PE -Echocardiogram to assess CHF etiology -Continue vancomycin and cefepime for now -Heart diet -Monitor telemetry  2. Urinary tract infection Patient endorses generalized abdominal pain. Urinalysis demonstrates positive nitrites, large leukocytes, and many bacteria. Already receiving IV cefepime 2g for pneumonia. -Continue IV cefepime 2g -Tylenol 650 mg Q4H prn  3. Abdominal pain Patient endorses history of constipation for 2 weeks. Abdomen is more protuberant than normal and more firm. Demonstrated tenderness to palpation. History of GI bleed, but Hgb stable at 12.1 from a month ago. No reports of BRBPR. Suspect this episode is due to constipation. -Monitor bowel movements -Monitor CBC -Hold ferrous sulfate 324 mg daily in setting of UTI, suspected pneumonia to prevent enhancing bacterial growth.  4. Eczema Patient has eczematous lesion on R anterolateral lower leg. -Triamcinolone 0.1% cream  5. Pulmonary health; asthma, COPD Patient reports history of asthma and COPD. -Continue Dulera 2 puffs daily  6. Mental health; depression, dementia, sleep disorder, diabetic neuropathy -Duloxetine 40 mg daily at bedtime -Memantine 10 mg BID -Trazodone 100 mg at bedtime -Pregabalin 150 mg bid  7. GI health; constipation, GERD -Senokot 2 tablets at bedtime -Pantoprazole 20 mg daily  8. Muscle spasms -Robaxin 750 mg 4 times daily  9. Hyperlipidemia -Atorvastatin 80 mg daily  10. CVA history Patient endorses significant lasting weakness in the LUE and LLE from CVA. He is  immobile. -Aspirin 81 mg daily for secondary prevention  11. Hypertension -Lasix 40 mg IM daily  Dispo: Admit patient to Inpatient with expected length of stay greater than 2 midnights.  Signed: Mikal Plane, Medical Student 06/26/2020, 7:17 PM  Pager: [phone] After 5pm on weekdays and 1pm on weekends: On Call pager [phone]

## 2020-06-26 NOTE — Progress Notes (Signed)
CLOIS, TREANOR ([id number]) Visit Report for 06/24/2020 Arrival Information Details Patient Name: Date of Service: [name], [place] LD L. 06/24/2020 3:15 PM Medical Record Number: [id number] Patient Account Number: [id number] Date of Birth/Sex: Treating RN: 1950/07/11 (70 y.o. Burnadette Pop, [name] Primary Care Maudell Stanbrough: Becky Sax Other Clinician: Referring Quantisha Marsicano: Treating Chikita Dogan/Extender: Legrand Pitts, Q UA N Weeks in Treatment: 3 Visit Information History Since Last Visit Added or deleted any medications: No Patient Arrived: Stretcher Any new allergies or adverse reactions: No Arrival Time: 15:44 Had a fall or experienced change in No Accompanied By: EMTS activities of daily living that may affect Transfer Assistance: Stretcher risk of falls: Patient Identification Verified: Yes Signs or symptoms of abuse/neglect since last visito No Secondary Verification Process Completed: Yes Hospitalized since last visit: No Patient Requires Transmission-Based Precautions: No Implantable device outside of the clinic excluding No Patient Has Alerts: No cellular tissue based products placed in the center since last visit: Has Dressing in Place as Prescribed: Yes Pain Present Now: No Electronic Signature(s) Signed: 06/24/2020 6:08:57 PM By: Rhae Hammock RN Entered By: Rhae Hammock on 06/24/2020 15:44:41 -------------------------------------------------------------------------------- Clinic Level of Care Assessment Details Patient Name: Date of Service: [name], [place] LD L. 06/24/2020 3:15 PM Medical Record Number: [id number] Patient Account Number: [id number] Date of Birth/Sex: Treating RN: September 13, 1950 (70 y.o. Burnadette Pop, [name] Primary Care Caedence Snowden: Becky Sax Other Clinician: Referring Sharmarke Cicio: Treating Paytyn Mesta/Extender: Legrand Pitts, Q UA N Weeks in Treatment: 3 Clinic Level of Care Assessment Items TOOL 4 Quantity Score X- 1  0 Use when only an EandM is performed on FOLLOW-UP visit ASSESSMENTS - Nursing Assessment / Reassessment X- 1 10 Reassessment of Co-morbidities (includes updates in patient status) X- 1 5 Reassessment of Adherence to Treatment Plan ASSESSMENTS - Wound and Skin A ssessment / Reassessment X - Simple Wound Assessment / Reassessment - one wound 1 5 []  - 0 Complex Wound Assessment / Reassessment - multiple wounds X- 1 10 Dermatologic / Skin Assessment (not related to wound area) ASSESSMENTS - Focused Assessment []  - 0 Circumferential Edema Measurements - multi extremities []  - 0 Nutritional Assessment / Counseling / Intervention []  - 0 Lower Extremity Assessment (monofilament, tuning fork, pulses) []  - 0 Peripheral Arterial Disease Assessment (using hand held doppler) ASSESSMENTS - Ostomy and/or Continence Assessment and Care []  - 0 Incontinence Assessment and Management []  - 0 Ostomy Care Assessment and Management (repouching, etc.) PROCESS - Coordination of Care X - Simple Patient / Family Education for ongoing care 1 15 []  - 0 Complex (extensive) Patient / Family Education for ongoing care X- 1 10 Staff obtains Programmer, systems, Records, T Results / Process Orders est []  - 0 Staff telephones HHA, Nursing Homes / Clarify orders / etc []  - 0 Routine Transfer to another Facility (non-emergent condition) []  - 0 Routine Hospital Admission (non-emergent condition) []  - 0 New Admissions / Biomedical engineer / Ordering NPWT Apligraf, etc. , []  - 0 Emergency Hospital Admission (emergent condition) X- 1 10 Simple Discharge Coordination []  - 0 Complex (extensive) Discharge Coordination PROCESS - Special Needs []  - 0 Pediatric / Minor Patient Management []  - 0 Isolation Patient Management []  - 0 Hearing / Language / Visual special needs []  - 0 Assessment of Community assistance (transportation, D/C planning, etc.) []  - 0 Additional assistance / Altered mentation []  -  0 Support Surface(s) Assessment (bed, cushion, seat, etc.) INTERVENTIONS - Wound Cleansing / Measurement X - Simple Wound Cleansing - one wound  1 5 []  - 0 Complex Wound Cleansing - multiple wounds X- 1 5 Wound Imaging (photographs - any number of wounds) []  - 0 Wound Tracing (instead of photographs) X- 1 5 Simple Wound Measurement - one wound []  - 0 Complex Wound Measurement - multiple wounds INTERVENTIONS - Wound Dressings []  - 0 Small Wound Dressing one or multiple wounds X- 1 15 Medium Wound Dressing one or multiple wounds []  - 0 Large Wound Dressing one or multiple wounds []  - 0 Application of Medications - topical []  - 0 Application of Medications - injection INTERVENTIONS - Miscellaneous []  - 0 External ear exam []  - 0 Specimen Collection (cultures, biopsies, blood, body fluids, etc.) []  - 0 Specimen(s) / Culture(s) sent or taken to Lab for analysis []  - 0 Patient Transfer (multiple staff / Civil Service fast streamer / Similar devices) []  - 0 Simple Staple / Suture removal (25 or less) []  - 0 Complex Staple / Suture removal (26 or more) []  - 0 Hypo / Hyperglycemic Management (close monitor of Blood Glucose) []  - 0 Ankle / Brachial Index (ABI) - do not check if billed separately X- 1 5 Vital Signs Has the patient been seen at the hospital within the last three years: Yes Total Score: 100 Level Of Care: New/Established - Level 3 Electronic Signature(s) Signed: 06/24/2020 6:08:57 PM By: Rhae Hammock RN Entered By: Rhae Hammock on 06/24/2020 15:53:05 -------------------------------------------------------------------------------- Encounter Discharge Information Details Patient Name: Date of Service: [name], [name] 06/24/2020 3:15 PM Medical Record Number: [id number] Patient Account Number: [id number] Date of Birth/Sex: Treating RN: 1950-09-11 (70 y.o. Marcheta Grammes Primary Care Whitten Andreoni: Becky Sax Other Clinician: Referring Taiyana [name]: Treating  Zannie Runkle/Extender: Legrand Pitts, Q UA N Weeks in Treatment: 3 Encounter Discharge Information Items Discharge Condition: Stable Ambulatory Status: Stretcher Discharge Destination: Home Transportation: Ambulance Schedule Follow-up Appointment: Yes Clinical Summary of Care: Provided on 06/24/2020 Form Type Recipient Paper Patient Patient Electronic Signature(s) Signed: 06/24/2020 6:04:04 PM By: Lorrin Jackson Entered By: Lorrin Jackson on 06/24/2020 18:04:03 -------------------------------------------------------------------------------- Multi Wound Chart Details Patient Name: Date of Service: [name], [name] 06/24/2020 3:15 PM Medical Record Number: [id number] Patient Account Number: [id number] Date of Birth/Sex: Treating RN: 06/17/1950 (70 y.o. Janyth Contes Primary Care Neta Upadhyay: Becky Sax Other Clinician: Referring Veora Fonte: Treating Richardo Popoff/Extender: Legrand Pitts, Q UA N Weeks in Treatment: 3 Vital Signs Height(in): 72 Pulse(bpm): 120 Weight(lbs): 238 Blood Pressure(mmHg): 134/86 Body Mass Index(BMI): 32 Temperature(F): 97.4 Respiratory Rate(breaths/min): 17 Photos: [1:No Photos Sacrum] [N/A:N/A N/A] Wound Location: [1:Gradually Appeared] [N/A:N/A] Wounding Event: [1:Pressure Ulcer] [N/A:N/A] Primary Etiology: [1:Anemia, Asthma, Chronic Obstructive] [N/A:N/A] Comorbid History: [1:Pulmonary Disease (COPD), Congestive Heart Failure, Coronary Artery Disease, Hypertension, Hypotension, Myocardial Infarction, Peripheral Arterial Disease, Peripheral Venous Disease, Cirrhosis , Rheumatoid Arthritis, Osteoarthritis,  Dementia 05/10/2017] [N/A:N/A] Date Acquired: [1:3] [N/A:N/A] Weeks of Treatment: [1:Open] [N/A:N/A] Wound Status: [1:3x3x1.2] [N/A:N/A] Measurements L x W x D (cm) [1:7.069] [N/A:N/A] A (cm) : rea [1:8.482] [N/A:N/A] Volume (cm) : [1:26.50%] [N/A:N/A] % Reduction in A rea: [1:11.80%] [N/A:N/A] % Reduction in Volume:  [1:7] Starting Position 1 (o'clock): [1:4] Ending Position 1 (o'clock): [1:2.5] Maximum Distance 1 (cm): [1:Yes] [N/A:N/A] Undermining: [1:Category/Stage III] [N/A:N/A] Classification: [1:Large] [N/A:N/A] Exudate A mount: [1:Purulent] [N/A:N/A] Exudate Type: [1:yellow, brown, green] [N/A:N/A] Exudate Color: [1:Distinct, outline attached] [N/A:N/A] Wound Margin: [1:Large (67-100%)] [N/A:N/A] Granulation A mount: [1:Red, Pink] [N/A:N/A] Granulation Quality: [1:None Present (0%)] [N/A:N/A] Necrotic A mount: [1:Fat Layer (Subcutaneous Tissue): Yes N/A] Exposed Structures: [1:Fascia: No Tendon:  No Muscle: No Joint: No Bone: No None] [N/A:N/A] Treatment Notes Electronic Signature(s) Signed: 06/24/2020 4:41:36 PM By: Kalman Shan DO Signed: 06/26/2020 6:30:22 PM By: Levan Hurst RN, BSN Entered By: Kalman Shan on 06/24/2020 16:39:34 -------------------------------------------------------------------------------- Multi-Disciplinary Care Plan Details Patient Name: Date of Service: [name], [name] 06/24/2020 3:15 PM Medical Record Number: [id number] Patient Account Number: [id number] Date of Birth/Sex: Treating RN: [birth year] (70 y.o. Burnadette Pop, [name] Primary Care Ethelbert Thain: Becky Sax Other Clinician: Referring Branston Halsted: Treating Cass Edinger/Extender: Paulene Floor UA N Weeks in Treatment: 3 Multidisciplinary Care Plan reviewed with physician Active Inactive Pressure Nursing Diagnoses: Knowledge deficit related to management of pressures ulcers Goals: Patient/caregiver will verbalize understanding of pressure ulcer management Date Initiated: 06/03/2020 Target Resolution Date: 07/01/2020 Goal Status: Active Interventions: Assess: immobility, friction, shearing, incontinence upon admission and as needed Assess offloading mechanisms upon admission and as needed Assess potential for pressure ulcer upon admission and as needed Provide education on  pressure ulcers Notes: Wound/Skin Impairment Nursing Diagnoses: Impaired tissue integrity Knowledge deficit related to ulceration/compromised skin integrity Goals: Patient/caregiver will verbalize understanding of skin care regimen Date Initiated: 06/03/2020 Target Resolution Date: 07/01/2020 Goal Status: Active Ulcer/skin breakdown will have a volume reduction of 30% by week 4 Date Initiated: 06/03/2020 Target Resolution Date: 07/01/2020 Goal Status: Active Interventions: Assess patient/caregiver ability to obtain necessary supplies Assess patient/caregiver ability to perform ulcer/skin care regimen upon admission and as needed Assess ulceration(s) every visit Provide education on ulcer and skin care Treatment Activities: Skin care regimen initiated : 06/03/2020 Topical wound management initiated : 06/03/2020 Notes: Electronic Signature(s) Signed: 06/24/2020 6:08:57 PM By: Rhae Hammock RN Entered By: Rhae Hammock on 06/24/2020 15:52:00 -------------------------------------------------------------------------------- Pain Assessment Details Patient Name: Date of Service: [name], [name] 06/24/2020 3:15 PM Medical Record Number: [id number] Patient Account Number: [id number] Date of Birth/Sex: Treating RN: 1950/05/05 (70 y.o. Erie Noe Primary Care Keng Jewel: Becky Sax Other Clinician: Referring Gayatri Teasdale: Treating Shametra Cumberland/Extender: Legrand Pitts, Q UA N Weeks in Treatment: 3 Active Problems Location of Pain Severity and Description of Pain Patient Has Paino No Site Locations Rate the pain. Current Pain Level: 0 Pain Management and Medication Current Pain Management: Electronic Signature(s) Signed: 06/24/2020 6:08:57 PM By: Rhae Hammock RN Entered By: Rhae Hammock on 06/24/2020 15:50:39 -------------------------------------------------------------------------------- Patient/Caregiver Education Details Patient Name: Date of  Service: [name], [name] 5/16/2022andnbsp3:15 PM Medical Record Number: [id number] Patient Account Number: [id number] Date of Birth/Gender: Treating RN: 1950/10/17 (70 y.o. Erie Noe Primary Care Physician: Becky Sax Other Clinician: Referring Physician: Treating Physician/Extender: Paulene Floor UA N Weeks in Treatment: 3 Education Assessment Education Provided To: Patient Education Topics Provided Pressure: Methods: Explain/Verbal Responses: State content correctly Electronic Signature(s) Signed: 06/24/2020 6:08:57 PM By: Rhae Hammock RN Entered By: Rhae Hammock on 06/24/2020 15:52:27 -------------------------------------------------------------------------------- Wound Assessment Details Patient Name: Date of Service: [name], [name] 06/24/2020 3:15 PM Medical Record Number: [id number] Patient Account Number: [id number] Date of Birth/Sex: Treating RN: [birth year] (70 y.o. Erie Noe Primary Care Kemp Gomes: Becky Sax Other Clinician: Referring Annayah Worthley: Treating Swara Donze/Extender: Legrand Pitts, Q UA N Weeks in Treatment: 3 Wound Status Wound Number: 1 Primary Pressure Ulcer Etiology: Wound Location: Sacrum Wound Open Wounding Event: Gradually Appeared Status: Date Acquired: 05/10/2017 Comorbid Anemia, Asthma, Chronic Obstructive Pulmonary Disease (COPD), Weeks Of Treatment: 3 History: Congestive Heart Failure, Coronary Artery Disease, Hypertension, Clustered Wound: No Hypotension, Myocardial Infarction, Peripheral  Arterial Disease, Peripheral Venous Disease, Cirrhosis , Rheumatoid Arthritis, Osteoarthritis, Dementia Photos Wound Measurements Length: (cm) 3 Width: (cm) 3 Depth: (cm) 1.2 Area: (cm) 7.069 Volume: (cm) 8.482 % Reduction in Area: 26.5% % Reduction in Volume: 11.8% Epithelialization: None Tunneling: No Undermining: Yes Starting Position (o'clock): 7 Ending Position  (o'clock): 4 Maximum Distance: (cm) 2.5 Wound Description Classification: Category/Stage III Wound Margin: Distinct, outline attached Exudate Amount: Large Exudate Type: Purulent Exudate Color: yellow, brown, green Foul Odor After Cleansing: No Slough/Fibrino No Wound Bed Granulation Amount: Large (67-100%) Exposed Structure Granulation Quality: Red, Pink Fascia Exposed: No Necrotic Amount: None Present (0%) Fat Layer (Subcutaneous Tissue) Exposed: Yes Tendon Exposed: No Muscle Exposed: No Joint Exposed: No Bone Exposed: No Treatment Notes Wound #1 (Sacrum) Cleanser Peri-Wound Care Skin Prep Discharge Instruction: Use skin prep as directed Topical Primary Dressing KerraCel Ag Gelling Fiber Dressing, 4x5 in (silver alginate) Discharge Instruction: Apply silver alginate to wound bed, line wound bed with alginate and then pack lightly to fil space Secondary Dressing Woven Gauze Sponge, Non-Sterile 4x4 in Discharge Instruction: Apply over primary dressing as directed. Secured With Tegaderm Transparent Film Dressing 4x4.75 (in/in) Discharge Instruction: Secure dressing with Tegaderm as directed. Compression Wrap Compression Stockings Add-Ons Electronic Signature(s) Signed: 06/25/2020 5:09:03 PM By: Rhae Hammock RN Signed: 06/26/2020 11:07:31 AM By: Sandre Kitty Previous Signature: 06/24/2020 6:08:57 PM Version By: Rhae Hammock RN Entered By: Sandre Kitty on 06/25/2020 16:03:38 -------------------------------------------------------------------------------- Vitals Details Patient Name: Date of Service: [name], [name] 06/24/2020 3:15 PM Medical Record Number: [id number] Patient Account Number: [id number] Date of Birth/Sex: Treating RN: 1950/11/28 (70 y.o. Burnadette Pop, [name] Primary Care Arabela Basaldua: Becky Sax Other Clinician: Referring Kateryna Grantham: Treating Caroleann Casler/Extender: Legrand Pitts, Q UA N Weeks in Treatment: 3 Vital  Signs Time Taken: 15:44 Temperature (F): 97.4 Height (in): 72 Pulse (bpm): 120 Weight (lbs): 238 Respiratory Rate (breaths/min): 17 Body Mass Index (BMI): 32.3 Blood Pressure (mmHg): 134/86 Reference Range: 80 - 120 mg / dl Electronic Signature(s) Signed: 06/24/2020 6:08:57 PM By: Rhae Hammock RN Entered By: Rhae Hammock on 06/24/2020 15:46:52

## 2020-06-26 NOTE — Progress Notes (Signed)
Pharmacy Antibiotic Note  [name] is a 70 y.o. male admitted on 06/26/2020 with pneumonia.  Pharmacy has been consulted for cefepime and vancomycin dosing. Patient with PMH significant for prior stroke, recent hospitalization for osteo, chronic wounds, and reportedly bed bound. Patient presents with increased dyspnea and O2 sat of 89% on room air. Current WBC 15.1, Scr 0.78, and afebrile.   Plan: Vancomycin 1750 MG x1 followed by vancomycin 1250 Q12H  Goal AUC 400-550. Predicted AUC 496 using Scr 0.78 Cefepime 2G Q8H  Monitor clinical status, renal function, and treatment plan  Vancomycin levels as indicated  Height: 6' (182.9 cm) Weight: 105.8 kg (233 lb 4 oz) IBW/kg (Calculated) : 77.6  Temp (24hrs), Avg:99.7 F (37.6 C), Min:99.7 F (37.6 C), Max:99.7 F (37.6 C)  Recent Labs  Lab 06/26/20 1139  WBC 15.1*  CREATININE 0.78    Estimated Creatinine Clearance: 106.5 mL/min (by C-G formula based on SCr of 0.78 mg/dL).    Allergies  Allergen Reactions  . Darvocet [Propoxyphene N-Acetaminophen] Hives  . Haldol [Haloperidol Decanoate] Hives  . Acetaminophen Nausea Only    Upset stomach, tolerates Hydrocodone/APAP if taken with food  . Metformin And Related Nausea And Vomiting  . Norco [Hydrocodone-Acetaminophen] Nausea And Vomiting    Tolerates if taken with food    Antimicrobials this admission: 5/18 Vanc >> 5/18 Cefepime >>  Dose adjustments this admission: NA  Microbiology results: NA  Thank you for allowing pharmacy to be a part of this patient's care.  Cephus Slater, PharmD, [facility] Resident [phone] 06/26/2020 2:35 PM

## 2020-06-26 NOTE — ED Provider Notes (Signed)
[facility] EMERGENCY DEPARTMENT Provider Note   CSN: [id number] Arrival date & time: 06/26/20  1128     History Chief Complaint  Patient presents with  . Respiratory Distress    Pt arrived via [facility] with c/c of Respiratory Distress. Per EMS pt is from home with shob last night worsen this AM. Pt had increased edema in abd & Bilateral extremities. . Pt arrived on C-Pap. Hxof COPD, CHF, Lung CX. Denies Fever.  89%RA --> 98% NRB, 98/58,       [name] is a 70 y.o. male.  HPI Patient with multiple medical issues including prior stroke, recent hospitalization for osteomyelitis, bedbound status, chronic wounds now presents with concern of dyspnea.  He arrives via EMS.  History is obtained by the patient and EMS providers.  The patient's history, however is difficult to understand as he has tangential, repetitive speech. Level 5 caveat secondary to acuity of condition. According to EMS the patient developed dyspnea yesterday, became worse today, prompting EMS notification.  They note that on arrival the patient had saturation of 89% on room air.  This improved to 96 percent with CPAP.  Patient did not receive nitroglycerin secondary to decrease in blood pressure, with a systolic less than 245 after initial interventions.  Patient transitioned from CPAP to nonrebreather mask prior to ED arrival, had saturation 96%. No report of other recent illness, fever, vomiting.      Past Medical History:  Diagnosis Date  . Acute blood loss anemia   . Acute CVA (cerebrovascular accident) ([place]) 08/27/2014  . Acute encephalopathy 11/28/2017  . Acute ischemic stroke ([place])   . AKI (acute kidney injury) ([place])   . Alcohol abuse    H/O  . Alcohol dependence with withdrawal with complication ([place]) 09/18/9831  . Anxiety   . Arterial ischemic stroke, MCA (middle cerebral artery), right, acute ([place])   . Arthritis   . Asthma   . Binocular vision disorder with diplopia  01/03/2013  . CAD (coronary artery disease) 05/27/10   Cath: severe single vessell CAD left cx midportion obtuse marginal 2 to 3.  . Cerebral infarction due to embolism of right middle cerebral artery ([place])   . Cerebral infarction due to stenosis of right carotid artery ([place]) 11/01/2014  . Cerebral thrombosis with cerebral infarction 07/18/2016  . Cerebrovascular accident (CVA) due to stenosis of right carotid artery ([place])   . CHF (congestive heart failure) ([place])   . Closed blow-out fracture of floor of orbit ([place]) 01/19/2013  . COPD (chronic obstructive pulmonary disease) ([place])   . COPD (chronic obstructive pulmonary disease) ([place])   . CVA (cerebral vascular accident) ([place]) 08/26/2014  . Dementia due to another medical condition ([place]) 07/12/2015  . Depression   . Diabetes mellitus, type 2 ([place]) 03/13/2015  . Diastolic CHF, acute on chronic (HCC) 11/01/2014  . DOE (dyspnea on exertion) 11/23/2014  . DVT (deep venous thrombosis) ([place]) 02/22/2014  . Embolic stroke ([place])   . Enophthalmos due to trauma 01/19/2013  . Eyelid lesion 01/24/2013  . Fracture of inferior orbital wall ([place]) 01/03/2013  . Fracture of orbital floor ([place]) 01/03/2013  . GERD (gastroesophageal reflux disease)   . H/O total knee replacement 10/24/2014  . HCAP (healthcare-associated pneumonia) 10/15/2015  . Headache around the eyes 08/27/2014  . History of DVT (deep vein thrombosis)   . History of DVT of lower extremity   . Hyperkalemia 10/24/2014  . Hyperlipemia   . Hyperlipidemia   . Hypertension   .  Hypoalbuminemia due to protein-calorie malnutrition ([place]dale)   . Hypotension   . Labile blood pressure   . Left shoulder pain 09/28/2017  . Lower extremity edema 02/21/2014  . Medial orbital wall fracture (HCC) 01/19/2013  . Myocardial infarction ([place]) 2000  . Orbital fracture ([place]) 12/2012  . OSA on CPAP   . Pain of left eye 01/03/2013  . Peripheral vascular disease, unspecified ([place]atham)    08/20/10 doppler: increase in right ABI  post-op. Left ABI stable. S/P bi-fem bypass surgery  . Pneumonia 04/04/2012  . Right middle cerebral artery stroke (Cranston) 01/18/2018  . Seizures ([place])   . Shortness of breath   . Stroke (cerebrum) ([place]) 02/06/2015  . Thrombocytopenia (Cent[place])   . Tobacco abuse   . Trichiasis without entropion 04/16/2013   Overview:  LLL central   . Type 2 diabetes mellitus with peripheral neuropathy ([place]) 03/13/2015  . Urinary incontinence 03/14/2015    Patient Active Problem List   Diagnosis Date Noted  . Physical deconditioning 06/20/2020  . Urinary retention 04/18/2020  . Constipation   . Upper GI bleed   . Chronic osteomyelitis ([place]) 04/12/2020  . Sacral ulcer ([place]) 02/27/2020  . Pleural effusion, left 02/27/2020  . Renal insufficiency 02/27/2020  . Adenocarcinoma of right lung, stage 1 ([place] Valley) 04/26/2019  . Gastric and duodenal angiodysplasia   . Iron deficiency anemia   . Heme positive stool   . Stenosis of brachiocephalic artery (Buchanan) 70/35/0093  . Lung nodule, solitary 03/28/2018  . Dementia due to another medical condition, without behavioral disturbance ([place])   . Insomnia 01/29/2016  . Psoriasiform dermatitis 07/12/2015  . Diabetes mellitus ([place]) 03/13/2015  . History of Hemiparesis affecting left side as late effect of cerebrovascular accident ([place]) 02/18/2015  . Coronary artery disease involving native coronary artery of native heart without angina pectoris   . HLD (hyperlipidemia) 11/01/2014  . Tobacco use disorder 11/01/2014  . Essential hypertension   . Polysubstance abuse ([place]) 08/28/2014  . Intracranial carotid stenosis   . History of DVT (deep vein thrombosis)   . Alcohol use disorder, moderate, dependence ([place]ack[place]ville)   . Stroke ([place]ett[place])   . Chronic diastolic congestive heart failure (Eek) 11/21/2013  . GERD (gastroesophageal reflux disease) 08/10/2013  . Gout 08/10/2013  . Peripheral vascular disease (McDonough) 07/27/2012  . COPD (chronic obstructive pulmonary disease) ([place] Rose) 04/04/2012     Past Surgical History:  Procedure Laterality Date  . abdoninal ao angio & bifem angio  05/27/10   Patent graft, occluded bil stents with no retrograde flow into the hypogastric arteries. 100% occl left ant. tibial artery, 70% to 80% to 100% stenosis right superficial fem artery above adductor canal. 100 % occl right ant. tibial vessell  . Aortogram w/ PTCA  09/292003  . BACK SURGERY    . BIOPSY  08/26/2018   Procedure: BIOPSY;  Surgeon: Lavena Bullion, DO;  Location: [facility]COPY;  Service: Gastroenterology;;  . CARDIAC CATHETERIZATION  05/27/10   severe CAD left cx  . CHOLECYSTECTOMY    . ESOPHAGOGASTRIC FUNDOPLICATION    . ESOPHAGOGASTRODUODENOSCOPY (EGD) WITH PROPOFOL N/A 08/26/2018   Procedure: ESOPHAGOGASTRODUODENOSCOPY (EGD) WITH PROPOFOL;  Surgeon: Lavena Bullion, DO;  Location: [facility];  Service: Gastroenterology;  Laterality: N/A;  . EYE SURGERY    . FEMORAL BYPASS  08/19/10   Right Fem-Pop  . FLEXIBLE SIGMOIDOSCOPY N/A 08/26/2018   Procedure: FLEXIBLE SIGMOIDOSCOPY;  Surgeon: Lavena Bullion, DO;  Location: [facility];  Service: Gastroenterology;  Laterality: N/A;  . HOT HEMOSTASIS N/A  08/26/2018   Procedure: HOT HEMOSTASIS (ARGON PLASMA COAGULATION/BICAP);  Surgeon: Lavena Bullion, DO;  Location[facility] ENDOSCOPY;  Service: Gastroenterology;  Laterality: N/A;  . IR ANGIO INTRA EXTRACRAN SEL COM CAROTID INNOMINATE BILAT MOD SED  07/20/2016  . IR ANGIO INTRA EXTRACRAN SEL COM CAROTID INNOMINATE BILAT MOD SED  01/14/2018  . IR ANGIO VERTEBRAL SEL SUBCLAVIAN INNOMINATE UNI R MOD SED  07/20/2016  . IR ANGIO VERTEBRAL SEL VERTEBRAL BILAT MOD SED  01/14/2018  . IR ANGIO VERTEBRAL SEL VERTEBRAL UNI L MOD SED  07/20/2016  . IR RADIOLOGIST EVAL & MGMT  05/27/2016  . IR US GUIDE VASC ACCESS RIGHT  01/14/2018  . JOINT REPLACEMENT    . PERIPHERAL VASCULAR CATHETERIZATION N/A 12/10/2014   Procedure: Abdominal Aortogram;  Surgeon: Angelia Mould, MD;  Location: [facility]  CV LAB;  Service: Cardiovascular;  Laterality: N/A;  . POLYPECTOMY  08/26/2018   Procedure: POLYPECTOMY;  Surgeon: Lavena Bullion, DO;  Location: [facility]COPY;  Service: Gastroenterology;;  . RADIOLOGY WITH ANESTHESIA N/A 02/08/2015   Procedure: RADIOLOGY WITH ANESTHESIA;  Surgeon: Luanne Bras, MD;  Location: [facility];  Service: Radiology;  Laterality: N/A;  . TEE WITHOUT CARDIOVERSION N/A 08/29/2014   Procedure: TRANSESOPHAGEAL ECHOCARDIOGRAM (TEE);  Surgeon: Josue Hector, MD;  Location: [facility] ENDOSCOPY;  Service: Cardiovascular;  Laterality: N/A;  . TOTAL KNEE ARTHROPLASTY         Family History  Problem Relation Age of Onset  . Heart attack Mother   . Heart failure Mother   . Cirrhosis Father   . Heart failure Brother   . Cancer Brother   . Hypertension Neg Hx        UNKNOWN  . Stroke Neg Hx        UNKNOWN    Social History   Tobacco Use  . Smoking status: Current Every Day Smoker    Packs/day: 0.50    Years: 50.00    Pack years: 25.00    Types: Cigars, Cigarettes    Start date: 02/09/1961  . Smokeless tobacco: Former Systems developer    Types: Chew  . Tobacco comment: 2 ppd, full flavor  Substance Use Topics  . Alcohol use: Yes    Comment: occasionally, "I might drink 1 every 6 months"  . Drug use: Yes    Types: Cocaine    Comment: "I smoke crack every once in a while"; last smoked yesterday    Home Medications Prior to Admission medications   Medication Sig Start Date End Date Taking? Authorizing Provider  aspirin EC 81 MG tablet Take 1 tablet (81 mg total) by mouth daily. Swallow whole. 05/02/20 05/02/21 Yes Gaylan Gerold, DO  atorvastatin (LIPITOR) 80 MG tablet Take 1 tablet (80 mg total) by mouth daily at 6 PM. 05/02/20  Yes Gaylan Gerold, DO  Chlorhexidine Gluconate Cloth 2 % PADS APPLY 6 EACH TOPICALLY DAILY. Patient taking differently: Apply 6 each topically daily. 04/18/20 04/18/21 Yes Marianna Payment, MD  DULoxetine (CYMBALTA) 20 MG capsule TAKE 2 CAPSULES (40 MG TOTAL)  BY MOUTH AT BEDTIME. Patient taking differently: Take 40 mg by mouth at bedtime. 04/18/20 04/18/21 Yes Marianna Payment, MD  ferrous sulfate 324 MG TBEC Take 324 mg by mouth daily.   Yes [provider]  furosemide (LASIX) 40 MG tablet TAKE 1 TABLET BY MOUTH (40MG  TOTAL) EVERY DAY Patient taking differently: Take 40 mg by mouth daily. 06/20/20  Yes Gaylan Gerold, DO  memantine (NAMENDA) 10 MG tablet Take 1 tablet (10 mg total)  by mouth 2 (two) times daily. 05/02/20 04/27/21 Yes Gaylan Gerold, DO  methocarbamol (ROBAXIN) 750 MG tablet TAKE 1 TABLET (750 MG TOTAL) BY MOUTH FOUR TIMES DAILY. Patient taking differently: Take 750 mg by mouth 4 (four) times daily. 04/18/20 04/18/21 Yes Marianna Payment, MD  Multiple Vitamin (MULTIVITAMIN ADULT PO) Take 1 tablet by mouth daily.   Yes [provider]  oxyCODONE (OXY IR/ROXICODONE) 5 MG immediate release tablet Take 1 tablet (5 mg total) by mouth every 6 (six) hours as needed for up to 3 days for severe pain. Patient taking differently: Take 5 mg by mouth every 4 (four) hours as needed for severe pain. 06/25/20 06/28/20 Yes Gaylan Gerold, DO  pantoprazole (PROTONIX) 20 MG tablet Take 1 tablet (20 mg total) by mouth daily. 04/12/20  Yes Sharyn Lull A, PA-C  potassium chloride SA (KLOR-CON) 20 MEQ tablet TAKE 1 TABLET (20 MEQ TOTAL) BY MOUTH DAILY. Patient taking differently: Take 20 mEq by mouth daily. 04/18/20 04/18/21 Yes Marianna Payment, MD  pregabalin (LYRICA) 150 MG capsule Take 1 capsule (150 mg total) by mouth 2 (two) times daily. 06/20/20 09/18/20 Yes Gaylan Gerold, DO  senna-docusate (SENOKOT-S) 8.6-50 MG tablet TAKE 2 TABLETS BY MOUTH AT BEDTIME. Patient taking differently: Take 2 tablets by mouth at bedtime as needed for mild constipation. 04/18/20 04/18/21 Yes Marianna Payment, MD  sodium hypochlorite (DAKIN'S 1/2 STRENGTH) external solution Irrigate with 1 application as directed See admin instructions. Apply to sacral pressure wound bid 04/18/20  Yes Marianna Payment, MD  traZODone (DESYREL) 100 MG tablet Take 1 tablet (100 mg total) by mouth at bedtime. 06/20/20 09/18/20 Yes Gaylan Gerold, DO  triamcinolone (KENALOG) 0.1 % Apply 1 application topically every 12 (twelve) hours. Legs, itching 02/14/20  Yes [provider]  Grant Ruts INHUB 250-50 MCG/DOSE AEPB Inhale 1-2 puffs into the lungs daily. 04/18/20  Yes Marianna Payment, MD    Allergies    Darvocet [propoxyphene n-acetaminophen], Haldol [haloperidol decanoate], Acetaminophen, Metformin and related, and Norco [hydrocodone-acetaminophen]  Review of Systems   Review of Systems  Unable to perform ROS: Acuity of condition    Physical Exam Updated Vital Signs BP 111/63   Pulse 98   Temp 99.7 F (37.6 C) (Oral)   Resp (!) 21   Ht 6' (1.829 m)   Wt 105.8 kg   SpO2 96%   BMI 31.63 kg/m   Physical Exam Vitals and nursing note reviewed.  Constitutional:      Appearance: He is well-developed. He is obese. He is ill-appearing and diaphoretic.  HENT:     Head: Normocephalic and atraumatic.  Eyes:     Conjunctiva/sclera: Conjunctivae normal.  Cardiovascular:     Rate and Rhythm: Regular rhythm. Tachycardia present.  Pulmonary:     Effort: Respiratory distress present.     Breath sounds: Decreased air movement present. Decreased breath sounds present.  Abdominal:     General: There is distension.     Tenderness: There is no abdominal tenderness. There is no guarding.  Skin:    General: Skin is warm.     Comments: Decubitus ulcer, midline with extension bilaterally.  No obvious full dermal depth, but will require additional evaluation once it has been cleaned.  Neurological:     Mental Status: He is alert.     Comments: Patient awake, alert, sitting upright, but not oriented to place or time.  Speech is repetitive, difficult to understand.  Psychiatric:     Comments: Impaired     ED Results /  Procedures / Treatments   Labs (all labs ordered are listed, but only abnormal results  are displayed) Labs Reviewed  COMPREHENSIVE METABOLIC PANEL - Abnormal; Notable for the following components:      Result Value   Glucose, Bld 268 (*)    Total Protein 6.0 (*)    Albumin 2.8 (*)    All other components within normal limits  CBC WITH DIFFERENTIAL/PLATELET - Abnormal; Notable for the following components:   WBC 15.1 (*)    Hemoglobin 12.1 (*)    Neutro Abs 13.2 (*)    Abs Immature Granulocytes 0.11 (*)    All other components within normal limits  URINALYSIS, ROUTINE W REFLEX MICROSCOPIC - Abnormal; Notable for the following components:   Color, Urine AMBER (*)    APPearance CLOUDY (*)    Hgb urine dipstick SMALL (*)    Protein, ur 100 (*)    Nitrite POSITIVE (*)    Leukocytes,Ua LARGE (*)    WBC, UA >50 (*)    Bacteria, UA MANY (*)    All other components within normal limits  RESP PANEL BY RT-PCR (FLU A&B, COVID) ARPGX2  BRAIN NATRIURETIC PEPTIDE    EKG EKG Interpretation  Date/Time:  Wednesday Jun 26 2020 11:39:56 EDT Ventricular Rate:  109 PR Interval:  176 QRS Duration: 92 QT Interval:  335 QTC Calculation: 452 R Axis:   68 Text Interpretation: Sinus tachycardia Atrial premature complex Artifact Abnormal ECG Confirmed by Carmin Muskrat [id number]) on 06/26/2020 11:56:02 AM   Radiology DG Chest Port 1 View  Result Date: 06/26/2020 CLINICAL DATA:  Shortness of breath.  Respiratory distress. EXAM: PORTABLE CHEST 1 VIEW COMPARISON:  Radiographs 04/12/2020 and 02/27/2020.  CT 04/12/2020. FINDINGS: 1208 hours. Stable cardiomegaly and aortic atherosclerosis. Right upper lobe perihilar and bibasilar pulmonary opacities are similar to the most recent examination, felt to reflect scarring on CT. No new airspace disease, enlarging pleural effusion or pneumothorax identified. The bones appear unchanged. Telemetry leads overlie the chest. IMPRESSION: No significant change in bilateral pulmonary opacities over the last 2 months, felt to reflect scarring on CT. No  definite acute findings. Stable cardiomegaly. Electronically Signed   By: Richardean Sale M.D.   On: 06/26/2020 12:44    Procedures Procedures   Medications Ordered in ED Medications  ceFEPIme (MAXIPIME) 2 g in sodium chloride 0.9 % 100 mL IVPB (has no administration in time range)  vancomycin (VANCOREADY) IVPB 1750 mg/350 mL (has no administration in time range)  ceFEPIme (MAXIPIME) 2 g in sodium chloride 0.9 % 100 mL IVPB (has no administration in time range)  vancomycin (VANCOREADY) IVPB 1250 mg/250 mL (has no administration in time range)  furosemide (LASIX) injection 40 mg (40 mg Intravenous Given 06/26/20 1342)    ED Course  I have reviewed the triage vital signs and the nursing notes.  Pertinent labs & imaging results that were available during my care of the patient were reviewed by me and considered in my medical decision making (see chart for details).   With concern for respiratory distress patient continue to receive nonrebreather mask oxygen ventilation with saturation 98% abnormal On cardiac monitor the patient had sinus tachycardia, rate 110, abnormal  2:52 PM On repeat exam patient is in similar condition.  Heart rate has diminished, respiratory rate has diminished, oxygen levels remained similar on supplemental oxygen.  He is mentating appropriately. X-ray concerning for pneumonia given the patient's hypoxia, mild hyperthermia, given his leukocytosis he has received appropriate antibiotics.  Patient meets multiple SIRS  criteria, but there is no evidence for endorgan damage, nor hypotension.  Given consideration of his respiratory distress, SIRS, he was admitted to our internal medicine teaching service for appropriate ongoing monitoring, management.  MDM Rules/Calculators/A&P MDM Number of Diagnoses or Management Options HCAP (healthcare-associated pneumonia): new, needed workup Respiratory distress: new, needed workup SIRS (systemic inflammatory response syndrome)  ([place]): new, needed workup   Amount and/or Complexity of Data Reviewed Clinical lab tests: ordered and reviewed Tests in the radiology section of CPT: reviewed and ordered Tests in the medicine section of CPT: reviewed and ordered Decide to obtain previous medical records or to obtain history from someone other than the patient: yes Obtain history from someone other than the patient: yes Review and summarize past medical records: yes Discuss the patient with other providers: yes Independent visualization of images, tracings, or specimens: yes  Risk of Complications, Morbidity, and/or Mortality Presenting problems: high Diagnostic procedures: high Management options: high  Critical Care Total time providing critical care: 30-74 minutes (45)  Patient Progress Patient progress: improved  Final Clinical Impression(s) / ED Diagnoses Final diagnoses:  Respiratory distress  SIRS (systemic inflammatory response syndrome) ([place])  HCAP (healthcare-associated pneumonia)     Carmin Muskrat, MD 06/26/20 1454

## 2020-06-26 NOTE — Hospital Course (Addendum)
Name: [name] MRN: [id number] DOB: January 01, 1951 70 y.o. PCP: Gaylan Gerold, DO   Date of Admission: 06/26/2020 11:28 AM Date of Discharge: 07/09/20 Attending Physician: Velna Ochs, MD   Discharge Diagnosis: 1. Ileus Secondary to Constipation  2. Post Stroke Left Shoulder Pain  3. Pressure Wounds 4. Depression.  5. Dysuria  6. Acute on Chronic Hypoxic Respiratory Failure Secondary to COPD   Discharge Medications:      Allergies as of 07/07/2020       Reactions    Darvocet [propoxyphene N-acetaminophen] Hives    Haldol [haloperidol Decanoate] Hives    Acetaminophen Nausea Only    Upset stomach, tolerates Hydrocodone/APAP if taken with food    Metformin And Related Nausea And Vomiting    Norco [hydrocodone-acetaminophen] Nausea And Vomiting    Tolerates if taken with food     Med Rec must be completed prior to using this [place]***             Disposition and follow-up:   [name] was discharged from [facility] in Stable condition.  At the hospital follow up visit please address:   1. Ileus Secondary to Constipation:               - Continue Miralax 17g BID until having 2-3 bowel movements daily, the de-escalate.              - Continue Sennakot-S BID             - Continue Linzess    2. Post Stroke Left Shoulder Pain:              - Continue Lidocaine patch              - Continue Tylenol             - Referral to Orthopaedic for shoulder injection   3. Pressure Wounds             - Continue to change dressings daily              - Continued wound care   4. Depression             - Continue Duloxetine 60 mg daily.    5. Dysuria              - Resolved   6. Acute on Chronic Hypoxic Respiratory Failure Secondary to COPD             - Continue 2L O2             - Continue Mucinex PRN             - Continue Flutter device             - Continue WIXELA INHUB 250-50 mcg/dose    2.  Labs / imaging needed at time of  follow-up: None   3.  Pending labs/ test needing follow-up: None   Follow-up Appointments: Hospital Course by problem list: 1. Ileus Secondary to Constipation  Patient presented with constipation for 2 weeks. Abdomen was more protuberant than his normal and firm. Demonstrated tenderness to palpation. Abdominal xray showed moderate colonic distension. Subsequent imaging showed large stool burden. Patient was given water enemas, miralax, linzess, and sennakot-S. Patients distension and abdominal tenderness improved after Linzess, 64 oz of Gatorade with a container of Miralax gave a very large, hard bowel movement. He continued to have bowel movements with increased miralax, sennakot-S BID, and  Linzess. He was discharged in stable condition on this regimen.    2. Chronic Post Stroke Left Shoulder Pain  Patient with chronic shoulder pain. Imaging showing acromioclavicular and glenohumeral degenerative changes. He was on opiates for his shoulder pain, but this was discontinued due to his constipation. He was managed with lidocaine patches and scheduled tylenol. His pain was partially controlled. MRI showed tendinosis of of the supraspinatus tendon, with partial thickness articular surface tear. Ortho was consulted for steroid injection. They will have [name] follow in the outpatient setting.    3. Pressure Wounds Patient presented with a sacral ulcer wound. Wound care was consulted for recommendations. [name]'s sacral wound was washed with soap and water, and patted dry. Aquacel was placed into the wound and covered with Mepilex dressing and changed daily. His sacral ulcer was additionally chemically debrided using silver nitrate, with much relief from the patient.    He additionally had 2 new blisters on the medial calcaneous bilaterally he was placed in heel dressing and placed in prevalon boots. He was discharged in stable condition with instructions for his wound care.    4. Depression  with Insomnia:  Patient with a History of depression since his stroke admitted to continued depression and lack of sleep. Psychiatry was consulted and they recommended increasing his duloxetine to 60 mg and better pain management. After optimizing his duloxetine, adding on Ambien, pain regimen, and having an impressively large bowel movement, the patient's mood markedly improved. Will continue with duloxetine and ambien on discharge.    5. Dysuria Secondary To UTI  Patient endorsed dysuria on admission. His urinalysis expressed positive nitrites, large leukocytes, and many bacteria. He was initially treated with cefepime, in the setting of treatment for presumed pneumonia, and his antibiotic course was completed with oral Keflex.    After completing his antibiotic course, he had continued penile pain on urination. Repeat urine culture was negative. He was found to have maceration of the glans secondary to retained moisture from his condom catheter. He was switched to a foley with alleviation of his symptoms.    6. Acute on Chronic Hypoxic Respiratory Failure Secondary to COPD Patient initially presented to the ED with acute on chronic hypoxic respiratory failure. He was found to be saturating at 70% on room air at his residence, and by the time EMS arrived he was saturating at 89%. His O2 saturation improved to 96% on CPAP, and he was transitioned to 15L in the ED. When the admitting team arrived, he was saturating with a SpO2 of 98-99% on 5 liter of O2. His initial labs showed a leukocytosis of 15.1. He was started on vancomycin and Cefepime.    His O2 saturations continued to improve to 2L, and his hypoxia was thought to be likely due to not receiving his home O2 after being discharged from his skilled nursing facility. Vancomycin and Cefepime were discontinued, and he continued to be stable on 2L O2. Discharging with orders for home O2.    Discharge Exam:   BP 115/62 (BP Location: Right Arm)    Pulse 79   Temp 98.9 F (37.2 C) (Axillary)   Resp 18   Ht 6' (1.829 m)   Wt 103.1 kg   SpO2 97%   BMI 30.83 kg/m    Physical Exam Constitutional:      General: He is not in acute distress.    Appearance: He is obese.     Comments: Laying in bed, able to answer questions  appropriately.   HENT:     Head: Normocephalic and atraumatic.  Cardiovascular:     Rate and Rhythm: Normal rate and regular rhythm.     Pulses: Normal pulses.     Heart sounds: Normal heart sounds.  Pulmonary:     Effort: Pulmonary effort is normal.     Breath sounds: Normal breath sounds.  Abdominal:     General: Bowel sounds are normal.     Tenderness: There is no guarding or rebound.     Comments: Marked improvement in distension, BS+, minimally tender to palpation.  Skin:    General: Skin is warm and dry. Flushing of bilateral pretibial area chronic, no concerning for cellulitis. Frictions blisters of the medial heels dressed and blanching.  Neurological:     Mental Status: He is alert and oriented to person, place, and time.  Psychiatric:        Mood and Affect: Mood normal.        Behavior: Behavior normal.    Pertinent Labs, Studies, and Procedures:  CMP Latest Ref Rng & Units 07/07/2020 07/01/2020 06/30/2020  Glucose 70 - 99 mg/dL 115(H) 123(H) 140(H)  BUN 8 - 23 mg/dL 20 25(H) 22  Creatinine 0.61 - 1.24 mg/dL 0.41(L) 0.61 0.44(L)  Sodium 135 - 145 mmol/L 143 144 144  Potassium 3.5 - 5.1 mmol/L 4.3 4.4 4.2  Chloride 98 - 111 mmol/L 107 108 109  CO2 22 - 32 mmol/L 32 30 31  Calcium 8.9 - 10.3 mg/dL 9.8 9.9 9.8  Total Protein 6.5 - 8.1 g/dL - - -  Total Bilirubin 0.3 - 1.2 mg/dL - - -  Alkaline Phos 38 - 126 U/L - - -  AST 15 - 41 U/L - - -  ALT 0 - 44 U/L - - -    CBC Latest Ref Rng & Units 07/07/2020 07/04/2020 07/03/2020  WBC 4.0 - 10.5 K/uL 5.2 5.3 6.7  Hemoglobin 13.0 - 17.0 g/dL 10.6(L) 10.2(L) 10.1(L)  Hematocrit 39.0 - 52.0 % 34.9(L) 33.8(L) 33.2(L)  Platelets 150 - 400 K/uL 209 199  199    EXAM: MRI OF THE LEFT SHOULDER WITHOUT CONTRAST   TECHNIQUE: Multiplanar, multisequence MR imaging of the shoulder was performed. No intravenous contrast was administered.   COMPARISON:  None.   FINDINGS: Patient motion degrades image quality limiting evaluation.   Rotator cuff: Mild tendinosis of the supraspinatus tendon with a partial-thickness articular surface tear. Infraspinatus tendon is intact. Teres minor tendon is intact. Subscapularis tendon is intact.   Muscles: Mild generalized muscle atrophy. Mild muscle edema in the medial most aspect of the supraspinatus, infraspinatus and subscapularis muscles which may be secondary to muscle strain versus neurogenic edema.   Biceps Long Head: Intraarticular and extraarticular portions of the biceps tendon are intact.   Acromioclavicular Joint: Severe arthropathy of the acromioclavicular joint. Type II acromion. No subacromial/subdeltoid bursal fluid.   Glenohumeral Joint: No joint effusion. Partial-thickness cartilage loss of the glenohumeral joint.   Labrum: Grossly intact, but evaluation is limited by lack of intraarticular fluid/contrast.   Bones: No fracture or dislocation. No aggressive osseous lesion.   Other: No fluid collection or hematoma.   IMPRESSION: 1. Mild tendinosis of the supraspinatus tendon with a partial-thickness articular surface tear. 2. Severe arthropathy of the acromioclavicular joint. 3. Mild muscle edema in the medial most aspect of the supraspinatus, infraspinatus and subscapularis muscles which may be secondary to muscle strain versus neurogenic edema.   EXAM: MRI CERVICAL SPINE WITHOUT CONTRAST   TECHNIQUE:  Multiplanar, multisequence MR imaging of the cervical spine was performed. No intravenous contrast was administered.   COMPARISON:  Cervical spine CT 09/19/2017   FINDINGS: Alignment: Slight anterolisthesis at C5-6.   Vertebrae: No fracture, evidence of discitis, or  bone lesion. Intervertebral ankylosis at C2-3   Cord: Unremarkable.   Posterior Fossa, vertebral arteries, paraspinal tissues: No perispinal mass or inflammation seen. Atrophy of intrinsic neck muscles.   Disc levels:   C2-3: Solid arthrodesis.   C3-4: Disc narrowing and bulging with uncovertebral ridging. Facet spurring on the left more than right. Moderate left more than right foraminal stenosis.   C4-5: Disc narrowing and bulging with mild uncovertebral and facet spurring. Patent appearance of the foramina. A central disc protrusion crowds the cord without deformity.   C5-6: Mild facet spurring and anterolisthesis. Minor disc bulging. No neural compression   C6-7: Disc narrowing with endplate and uncovertebral ridging but no neural compression. Negative facets.   C7-T1:Mild facet spurring.  No neural compression.   IMPRESSION: 1. No acute finding. 2. Disc and facet degeneration with up to mild spinal stenosis at C4-5. On the symptomatic left side there is moderate foraminal stenosis at C3-4   CLINICAL DATA:  Abdominal distension, diarrhea, shortness of breath and productive cough for several weeks   EXAM: ABDOMEN - 1 VIEW   COMPARISON:  Radiograph 06/27/2020, CT 05/29/2020   FINDINGS: Large colonic stool burden is noted. Additionally, there are numerous air-filled loops of nondistended small bowel throughout the abdomen though there may be some fold thickening of the jejunal loops in the left upper quadrant which could be seen in the setting of an enteritis. Stomach is distended with air without visible nasogastric decompression in place. Surgical clips noted in the upper abdomen. Vascular stent in the expected location of right iliac artery. Multiple surgical clips project over the left hip as well. Multilevel degenerative changes in the spine hips and pelvis unchanged comparison.   IMPRESSION: Large colonic stool burden, correlate for features  of constipation/slowed intestinal transit.   Nonspecific diffusely air-filled appearance of much of the small bowel albeit with some questionable fold thickening of the jejunal loops which could be seen in the setting of an enteritis.   Air distention of the stomach, consider nasogastric decompression.   Postsurgical changes in the upper abdomen and left hip.   Aortic Atherosclerosis (ICD10-I70.0). Vascular stenting right iliac artery.   EXAM: ABDOMEN - 1 VIEW   COMPARISON:  07/01/2020.  CT 05/29/2020.   FINDINGS: Surgical clips noted in the upper abdomen and over both inguinal regions. Right iliac stent again noted. Aortoiliac atherosclerotic vascular calcification. Large amount of stool again noted in the colon and rectum suggesting constipation. Moderate colonic distention noted on today's exam. No small bowel distention. Mild gastric distention remains. No free air. Bibasilar atelectasis. Degenerative changes lumbar spine and both hips.   IMPRESSION: Large amount of stool again noted throughout the colon and rectum. Constipation could present this fashion. There is associated moderate colonic distention noted on today's exam. Follow-up exam suggested to demonstrate resolution in order to exclude colonic obstruction. No small bowel distention noted. Mild gastric distention again noted.   EXAM: CT ANGIOGRAPHY CHEST WITH CONTRAST   TECHNIQUE: Multidetector CT imaging of the chest was performed using the standard protocol during bolus administration of intravenous contrast. Multiplanar CT image reconstructions and MIPs were obtained to evaluate the vascular anatomy.   CONTRAST:  26mL OMNIPAQUE IOHEXOL 350 MG/ML SOLN   COMPARISON:  04/12/2020   FINDINGS: Cardiovascular:  There is adequate opacification of the pulmonary arterial tree. There is eccentric mural thrombus and synechia noted within the left lower lobe segmental pulmonary arteries most in keeping with  chronic pulmonary embolism. There is, however, no intraluminal filling defect to suggest acute pulmonary embolism. The central pulmonary arteries are mildly enlarged, particularly on the left, in keeping with changes of pulmonary arterial hypertension.   Extensive multi-vessel coronary artery calcification. Global cardiac size is within normal limits. No pericardial effusion. Extensive mixed atherosclerotic plaque is noted within the thoracic aorta. No aortic aneurysm. Particularly prominent atherosclerotic plaque is seen at the origin of the a innominate artery resulting in a greater than 50% stenosis, better appreciated on prior examination of 04/12/2020.   Mediastinum/Nodes: The visualized thyroid is unremarkable. There is progressive right hilar and right paratracheal adenopathy, possibly reactive in nature. The esophagus is unremarkable. Small hiatal hernia noted. Surgical changes of a gastric fundoplication are identified.   Lungs/Pleura: Since the prior examination, there has developed bilateral lower lobe consolidation, right greater than left, as well as consolidation within the lateral segment of the right middle lobe suspicious for aspiration. Acute infection could appear similarly. Trace left pleural effusion is present, improved since prior examination. Parenchymal scarring within the right upper lobe with mild volume loss is unchanged. No pneumothorax. No central obstructing lesion.   Upper Abdomen: Cholecystectomy has been performed. No acute abnormality.   Musculoskeletal: Asymmetric sclerosis of the a left posterior seventh rib again noted possibly related to Paget's disease. No acute bone abnormality.   Review of the MIP images confirms the above findings.   IMPRESSION: No acute pulmonary embolism.   Sequela of a chronic pulmonary embolism with multiple synechia and eccentric mural thrombus within the left lower lobe. Morphologic changes in keeping with  pulmonary arterial hypertension, more prominent involving the left pulmonary artery.   Interval development of bibasilar consolidation suspicious for changes of aspiration. Acute infection could appear similarly. Associated shotty right hilar and right paratracheal adenopathy is likely reactive in nature.   Extensive multi-vessel coronary artery calcification.   Peripheral vascular disease with greater than 50% stenosis of the innominate artery.   Aortic Atherosclerosis (ICD10-I70.0). Discharge Instructions:   Signed: Iona Beard 07/09/20, 12:23 PM   Pager: [phone]

## 2020-06-27 ENCOUNTER — Other Ambulatory Visit (HOSPITAL_COMMUNITY): Payer: Medicare Other

## 2020-06-27 ENCOUNTER — Inpatient Hospital Stay (HOSPITAL_COMMUNITY): Payer: Medicare Other

## 2020-06-27 DIAGNOSIS — R0603 Acute respiratory distress: Secondary | ICD-10-CM | POA: Diagnosis not present

## 2020-06-27 DIAGNOSIS — D649 Anemia, unspecified: Secondary | ICD-10-CM

## 2020-06-27 DIAGNOSIS — N390 Urinary tract infection, site not specified: Secondary | ICD-10-CM | POA: Diagnosis not present

## 2020-06-27 DIAGNOSIS — R109 Unspecified abdominal pain: Secondary | ICD-10-CM | POA: Diagnosis not present

## 2020-06-27 LAB — COMPREHENSIVE METABOLIC PANEL
ALT: 11 U/L (ref 0–44)
AST: 11 U/L — ABNORMAL LOW (ref 15–41)
Albumin: 2.5 g/dL — ABNORMAL LOW (ref 3.5–5.0)
Alkaline Phosphatase: 53 U/L (ref 38–126)
Anion gap: 4 — ABNORMAL LOW (ref 5–15)
BUN: 21 mg/dL (ref 8–23)
CO2: 30 mmol/L (ref 22–32)
Calcium: 9.2 mg/dL (ref 8.9–10.3)
Chloride: 107 mmol/L (ref 98–111)
Creatinine, Ser: 0.77 mg/dL (ref 0.61–1.24)
GFR, Estimated: >60 mL/min (ref >60)
Glucose, Bld: 209 mg/dL — ABNORMAL HIGH (ref 70–99)
Potassium: 2.9 mmol/L — ABNORMAL LOW (ref 3.5–5.1)
Sodium: 141 mmol/L (ref 135–145)
Total Bilirubin: 0.7 mg/dL (ref 0.3–1.2)
Total Protein: 5.4 g/dL — ABNORMAL LOW (ref 6.5–8.1)

## 2020-06-27 LAB — CBC
HCT: 36.6 % — ABNORMAL LOW (ref 39.0–52.0)
Hemoglobin: 11 g/dL — ABNORMAL LOW (ref 13.0–17.0)
MCH: 26.7 pg (ref 26.0–34.0)
MCHC: 30.1 g/dL (ref 30.0–36.0)
MCV: 88.8 fL (ref 80.0–100.0)
Platelets: 176 10*3/uL (ref 150–400)
RBC: 4.12 MIL/uL — ABNORMAL LOW (ref 4.22–5.81)
RDW: 14.6 % (ref 11.5–15.5)
WBC: 8.9 10*3/uL (ref 4.0–10.5)
nRBC: 0 % (ref 0.0–0.2)

## 2020-06-27 LAB — MRSA PCR SCREENING: MRSA by PCR: POSITIVE — AB

## 2020-06-27 MED ORDER — CEPHALEXIN 500 MG PO CAPS
500.0000 mg | ORAL_CAPSULE | Freq: Two times a day (BID) | ORAL | Status: AC
  Administered 2020-06-27 – 2020-07-01 (×8): 500 mg via ORAL
  Filled 2020-06-27 (×8): qty 1

## 2020-06-27 MED ORDER — POTASSIUM CHLORIDE CRYS ER 20 MEQ PO TBCR
40.0000 meq | EXTENDED_RELEASE_TABLET | Freq: Two times a day (BID) | ORAL | Status: AC
  Administered 2020-06-27 (×2): 40 meq via ORAL
  Filled 2020-06-27 (×2): qty 2

## 2020-06-27 MED ORDER — MUPIROCIN 2 % EX OINT
1.0000 "application " | TOPICAL_OINTMENT | Freq: Two times a day (BID) | CUTANEOUS | Status: AC
  Administered 2020-06-27 – 2020-07-02 (×10): 1 via NASAL
  Filled 2020-06-27: qty 22

## 2020-06-27 MED ORDER — ADULT MULTIVITAMIN W/MINERALS CH
1.0000 | ORAL_TABLET | Freq: Every day | ORAL | Status: DC
  Administered 2020-06-27 – 2020-07-09 (×13): 1 via ORAL
  Filled 2020-06-27 (×13): qty 1

## 2020-06-27 MED ORDER — CHLORHEXIDINE GLUCONATE CLOTH 2 % EX PADS
6.0000 | MEDICATED_PAD | Freq: Every day | CUTANEOUS | Status: AC
  Administered 2020-06-28 – 2020-07-02 (×4): 6 via TOPICAL

## 2020-06-27 MED ORDER — CHLORHEXIDINE GLUCONATE CLOTH 2 % EX PADS: 6.0000 | MEDICATED_PAD | Freq: Every day | CUTANEOUS | Status: DC

## 2020-06-27 MED ORDER — IOHEXOL 350 MG/ML SOLN
75.0000 mL | Freq: Once | INTRAVENOUS | Status: AC | PRN
  Administered 2020-06-27: 75 mL via INTRAVENOUS

## 2020-06-27 MED ORDER — JUVEN PO PACK
1.0000 | PACK | Freq: Two times a day (BID) | ORAL | Status: DC
  Administered 2020-06-27 – 2020-07-09 (×23): 1 via ORAL
  Filled 2020-06-27 (×23): qty 1

## 2020-06-27 NOTE — Progress Notes (Addendum)
Initial Nutrition Assessment  DOCUMENTATION CODES:   Obesity unspecified  INTERVENTION:   -Double protein portions at meals -MVI with minerals daily -Lactaid milk with meals -1 packet Juven BID, each packet provides 95 calories, 2.5 grams of protein (collagen), and 9.8 grams of carbohydrate (3 grams sugar); also contains 7 grams of L-arginine and L-glutamine, 300 mg vitamin C, 15 mg vitamin E, 1.2 mcg vitamin B-12, 9.5 mg zinc, 200 mg calcium, and 1.5 g  Calcium Beta-hydroxy-Beta-methylbutyrate to support wound healing -Downgrade diet to dysphagia 3 (advanced mechanical soft) for ease of intake  NUTRITION DIAGNOSIS:   Increased nutrient needs related to wound healing as evidenced by estimated needs.  GOAL:   Patient will meet greater than or equal to 90% of their needs  MONITOR:   PO intake,Supplement acceptance,Labs,Weight trends,Skin,I & O's  REASON FOR ASSESSMENT:   Low [place]    ASSESSMENT:   [name] is a 70 y.o. male with a PMH of CVA, asthma, CAD, CHF, COPD, DVT who presents for evaluation of dyspnea.  Pt admitted with acute respiratory failure and UTI.   Reviewed I/O's: -600 ml x 24 hours  UOP: 600 ml x 24 hours  Per [facility] notes, pt with stage IV pressure injury to sacrum.   Spoke with pt at bedside, who was pleasant and in good spirits today. He reports that he ate a good breakfast today with staff assistance. He shares that he has had chronically poor oral intake for the past several months, both at SNF and at home. Per pt, he disliked food at SNF and at home he was mostly reliant on food from meals on wheels. He was unable to consume most of the food from meals on wheels as meats and vegetables were often too tough (pt does not have teeth or dentures- "I haven't had teeth in my mouth for over 30 years").   Per pt, she ate most of his breakfast but the sausage was too tough for him.   Pt shares his UBW is around 250# and he has gradually lost weight  over the past 2 years (about 100#). However, reviewed wt hx; wt has been stable over the past year.   RD discussed importance of good meal and supplement intake to promote healing. Pt reports that he has been trying to focus on protein intake and would rather get protein needs from food rather than supplements. Pt shares he takes and MVI at home and would like to continue this. He is also requesting lactaid milk.   Medications reviewed and include potassium chloride.  Labs reviewed: K: 2.9.   NUTRITION - FOCUSED PHYSICAL EXAM:  Flowsheet Row Most Recent Value  Orbital Region No depletion  Upper Arm Region Mild depletion  Thoracic and Lumbar Region No depletion  Buccal Region No depletion  Temple Region Mild depletion  Clavicle Bone Region No depletion  Clavicle and Acromion Bone Region No depletion  Scapular Bone Region No depletion  Dorsal Hand No depletion  Patellar Region No depletion  Anterior Thigh Region No depletion  Posterior Calf Region No depletion  Edema (RD Assessment) Mild  Hair Reviewed  Eyes Reviewed  Mouth Reviewed  Skin Reviewed  Nails Reviewed       Diet Order:   Diet Order            DIET DYS 3 Room service appropriate? Yes with Assist; Fluid consistency: Thin  Diet effective now  EDUCATION NEEDS:   Education needs have been addressed  Skin:  Skin Assessment: Skin Integrity Issues: Skin Integrity Issues:: Stage IV Stage IV: sacrum  Last BM:  06/27/20  Height:   Ht Readings from Last 1 Encounters:  06/26/20 6' (1.829 m)    Weight:   Wt Readings from Last 1 Encounters:  06/26/20 105.8 kg    Ideal Body Weight:  80.9 kg  BMI:  Body mass index is 31.63 kg/m.  Estimated Nutritional Needs:   Kcal:  2400-2600  Protein:  120-135 grams  Fluid:  >2 L    Loistine Chance, RD, LDN, [place] Registered Dietitian II Certified Diabetes Care and Education Specialist Please refer to [facility] for RD and/or RD on-call/weekend/after  hours pager

## 2020-06-27 NOTE — NC FL2 (Signed)
[place] LEVEL OF CARE SCREENING TOOL     IDENTIFICATION  Patient Name: [name] Birthdate: 1950-06-08 Sex: male Admission Date (Current Location): 06/26/2020  [facility] and [place] Number:  Herbalist and Address:  [facility], [place] [address] [place], [place] [address]      Provider Number: [id number]  Attending Physician Name and Address:  Angelica Pou, MD  Relative Name and Phone Number:  [name] [phone]    Current Level of Care: Hospital Recommended Level of Care: [place] Prior Approval Number:    Date Approved/Denied:   PASRR Number: [id number]  Discharge Plan: SNF    Current Diagnoses: Patient Active Problem List   Diagnosis Date Noted  . Acute respiratory failure ([place]) 06/26/2020  . Physical deconditioning 06/20/2020  . Urinary retention 04/18/2020  . Constipation   . Upper GI bleed   . Chronic osteomyelitis ([place]) 04/12/2020  . Sacral ulcer ([place]) 02/27/2020  . Pleural effusion, left 02/27/2020  . Renal insufficiency 02/27/2020  . Adenocarcinoma of right lung, stage 1 ([place]) 04/26/2019  . Gastric and duodenal angiodysplasia   . Iron deficiency anemia   . Heme positive stool   . Stenosis of brachiocephalic artery ([place]) 32/01/2481  . Lung nodule, solitary 03/28/2018  . Dementia due to another medical condition, without behavioral disturbance ([place])   . Insomnia 01/29/2016  . Psoriasiform dermatitis 07/12/2015  . Diabetes mellitus ([place]) 03/13/2015  . History of Hemiparesis affecting left side as late effect of cerebrovascular accident ([place]) 02/18/2015  . Coronary artery disease involving native coronary artery of native heart without angina pectoris   . HLD (hyperlipidemia) 11/01/2014  . Tobacco use disorder 11/01/2014  . Essential hypertension   . Polysubstance abuse ([place]) 08/28/2014  . Intracranial carotid stenosis   . History of DVT (deep vein thrombosis)   .  Alcohol use disorder, moderate, dependence ([place])   . Stroke ([place])   . Chronic diastolic congestive heart failure ([place]) 11/21/2013  . GERD (gastroesophageal reflux disease) 08/10/2013  . Gout 08/10/2013  . Peripheral vascular disease ([place]) 07/27/2012  . COPD (chronic obstructive pulmonary disease) ([place]) 04/04/2012    Orientation RESPIRATION BLADDER Height & Weight     Self,Time,Situation,Place  O2 (2L [place]) Incontinent,External catheter Weight: 233 lb 4 oz (105.8 kg) Height:  6' (182.9 cm)  BEHAVIORAL SYMPTOMS/MOOD NEUROLOGICAL BOWEL NUTRITION STATUS      Incontinent Diet (See DC Summary)  AMBULATORY STATUS COMMUNICATION OF NEEDS Skin   Extensive Assist Verbally  (stage IV on sacrum with foam dressing)       PU Stage 4 Dressing: Daily               Personal Care Assistance Level of Assistance  Bathing,Feeding,Dressing Bathing Assistance: Maximum assistance Feeding assistance: Maximum assistance Dressing Assistance: Maximum assistance     Functional Limitations Info             SPECIAL CARE FACTORS FREQUENCY  PT (By licensed PT),OT (By licensed OT)     PT Frequency: 3x/week OT Frequency: 3x/week            Contractures Contractures Info: Not present    Additional Factors Info  Code Status,Allergies,Psychotropic Code Status Info: DNR Allergies Info: Darvocet (Propoxyphene N-acetaminophen)   Haldol (Haloperidol Decanoate)   Acetaminophen   Metformin And Related   Norco (Hydrocodone-acetaminophen) Psychotropic Info: DULoxetine (CYMBALTA) DR capsule 40 mg daily at bedtime, traZODone (DESYREL) tablet 100 mg daily at bedtime,  Current Medications (06/27/2020):  This is the current hospital active medication list Current Facility-Administered Medications  Medication Dose Route Frequency Provider Last Rate Last Admin  . acetaminophen (TYLENOL) tablet 650 mg  650 mg Oral Q4H PRN Iona Beard, MD      . aspirin EC tablet 81 mg  81 mg Oral Daily Maudie Mercury, MD   81 mg at 06/27/20 0915  . atorvastatin (LIPITOR) tablet 80 mg  80 mg Oral q1800 Maudie Mercury, MD   80 mg at 06/26/20 1959  . ceFEPIme (MAXIPIME) 2 g in sodium chloride 0.9 % 100 mL IVPB  2 g Intravenous Q8H Llana Aliment, RPH 200 mL/hr at 06/27/20 0607 2 g at 06/27/20 9373  . DULoxetine (CYMBALTA) DR capsule 40 mg  40 mg Oral QHS Maudie Mercury, MD   40 mg at 06/26/20 2306  . enoxaparin (LOVENOX) injection 40 mg  40 mg Subcutaneous Q24H Maudie Mercury, MD      . memantine [facility]) tablet 10 mg  10 mg Oral BID Maudie Mercury, MD   10 mg at 06/27/20 0915  . mometasone-formoterol (DULERA) 200-5 MCG/ACT inhaler 2 puff  2 puff Inhalation BID Maudie Mercury, MD   2 puff at 06/27/20 0915  . pantoprazole (PROTONIX) EC tablet 20 mg  20 mg Oral Daily Maudie Mercury, MD   20 mg at 06/27/20 0915  . potassium chloride SA (KLOR-CON) CR tablet 40 mEq  40 mEq Oral BID Maudie Mercury, MD   40 mEq at 06/27/20 0915  . pregabalin (LYRICA) capsule 150 mg  150 mg Oral BID Maudie Mercury, MD   150 mg at 06/27/20 0915  . senna-docusate (Senokot-S) tablet 2 tablet  2 tablet Oral QHS PRN Maudie Mercury, MD      . traZODone (DESYREL) tablet 100 mg  100 mg Oral QHS Maudie Mercury, MD   100 mg at 06/26/20 2235  . triamcinolone cream (KENALOG) 0.1 % cream 1 application  1 application Topical S28J Maudie Mercury, MD   1 application at 68/11/57 0915  . vancomycin (VANCOREADY) IVPB 1250 mg/250 mL  1,250 mg Intravenous Q12H Llana Aliment, RPH 166.7 mL/hr at 06/27/20 0424 1,250 mg at 06/27/20 0424     Discharge Medications: Please see discharge summary for a list of discharge medications.  Relevant Imaging Results:  Relevant Lab Results:   Additional Information SS#: [id number]. Not vaccinated in [place], [place]

## 2020-06-27 NOTE — Progress Notes (Signed)
Subjective:  Patient is doing much better today because he can "breathe much better." He still endorses a cough with sputum. His abdomen continues to be tender, and he feels that it is still more protuberant than usual. He had a small bowel movement this morning. He also had a very soft bowel movement accidentally during the interview.  Objective:  Vital signs in last 24 hours: Vitals:   06/26/20 2135 06/27/20 0430 06/27/20 0809 06/27/20 1215  BP: 132/66 106/64 108/67 (!) 106/59  Pulse: 100 87 91 85  Resp: 20 18 (!) 23 (!) 23  Temp: 98.2 F (36.8 C) 97.9 F (36.6 C) 98.1 F (36.7 C) 98 F (36.7 C)  TempSrc: Oral Oral Axillary Oral  SpO2: 97% 95% 93% 94%  Weight:      Height:       Weight change:   Intake/Output Summary (Last 24 hours) at 06/27/2020 1304 Last data filed at 06/27/2020 1003 Gross per 24 hour  Intake 240 ml  Output 1150 ml  Net -910 ml   Physical Exam Constitutional:      Comments: Patient is alert. He is cooperative. He looks chronically ill. He is laying in bed and conversant.  Cardiovascular:     Rate and Rhythm: Normal rate and regular rhythm.  Pulmonary:     Comments: Breathing effort improved. Breath sounds improved in all lung fields, but milder rhonchi still appreciated in all lung fields posteriorly. Speaks easier without stopping for a breath as often. Abdominal:     General: Abdomen is protuberant. Bowel sounds are normal.     Tenderness: There is generalized abdominal tenderness.     Comments: Abdomen is soft, yet firm, and tympanitic.  Feet:     Right foot:     Toenail Condition: Right toenails are abnormally thick and long.     Left foot:     Toenail Condition: Left toenails are abnormally thick and long.     Comments: Ingrown toenail of left great toe. Skin:    Comments: Generalized dryness bilaterally. Small crusted lesion anterolaterally on right lower leg. Large decubitus ulcer overlying midline sacrum.  Psychiatric:        Attention  and Perception: Attention normal.        Mood and Affect: Mood normal.     Comments: Speech is clearer and noticeably easier for him.    Assessment/Plan:  Active Problems:   Acute respiratory failure [facility])  Patient Summary: [name] is a 70 y.o. male with a PMH of CVA, asthma, CAD, CHF, COPD, DVT who presents on hospital day 1 for evaluation of acute respiratory distress secondary to lack of oxygen in the setting of COPD.  1. Acute respiratory failure Patient is breathing very well this morning on 2L [place]. His SpO2 is 94%. He still endorses cough with sputum. He is afebrile. Today, there is milder rhonchi over all posterior lung fields. WBC has decreased from 15.1 to 8.9. D-dimer was positive last night, CTA obtained with impression of no acute PE, chronic PE with multiple adhesions in left lower lobe, morphologic changes consistent with PAH, and bibasilar consolidation consistent with aspiration or acute infection. He reiterated that he did not receive his oxygen after leaving [facility]. Acute respiratory failure likely secondary to lack of home oxygen in setting of COPD. -Continue oxygen 2L nasal cannula -Sputum culture -Monitor telemetry  2. Urinary tract infection Patient continues to have diffuse abdominal pain. Was receiving IV cefepime 2 g. In anticipation for discharge  with home health, need oral regimen. Currently have external catheter. Due to history of urine retention, will need bladder scans Q4H and I/O cath prn. -Oral amoxicillin 500 mg Q8H for 9 doses -Order bladder scans Q4H  3. Abdominal pain Patient continues to have diffuse abdominal pain. Abdomen is soft yet firm and tympanitic to percussion. It is painful to palpation in all quadrants. He has had one small bowel movement this morning with some very soft stool during the interview. KUB ordered and demonstrated gastric distention and moderate colonic distention that could be representative of ileus. He  was hypokalemic this morning at K of 2.9. Suspect distention 2/2 hypokalemia, possibly hypomagnesemia. -Monitor bowel movements -Monitor CBC -Repleted K with KCl 40 mEq BID -Check Mg in morning -Continue to hold ferrous sulfate 324 mg daily in setting of UTI, constipation  4. Normocytic anemia Patient's Hgb this morning 11, down from 12.1 yesterday. MCV is 88.8 and stable. No evidence of acute bleed. Fluid administration could have caused dilution. -Trend CBC   LOS: 1 day   Mikal Plane, Medical Student 06/27/2020, 1:04 PM Pager: [phone] After 5pm on weekdays and 1pm on weekends: On Call pager [phone]

## 2020-06-27 NOTE — Consult Note (Signed)
WOC Nurse Consult Note: Patient receiving care in [facility] [phone]. Reason for Consult: sacral ulcer Wound type: Chronic stage 4 PI Pressure Injury POA: Yes Measurement: 3.5 cm x 2.5 cm x 2 cm Wound bed: slick pink Drainage (amount, consistency, odor) grey, thick drainage, no odor, epibole present and some undermining Periwound: intact Dressing procedure/placement/frequency:  Wash sacral/coccyx wound with soap and water, pat dry. Place a size appropriate piece of Aquacel [name] [phone]) into the wound, cover with a Mepilex 6 x 6 dressing [name] [phone]). Change daily. I have also added turning instructions to avoid pressure to the area. Thank you for the consult.  Discussed plan of care with the patient and bedside nurse.  [place] nurse will not follow at this time.  Please re-consult the [place] team if needed.  Val Riles, RN, MSN, CWOCN, CNS-BC, pager [phone]

## 2020-06-28 LAB — BASIC METABOLIC PANEL
Anion gap: 6 (ref 5–15)
BUN: 23 mg/dL (ref 8–23)
CO2: 30 mmol/L (ref 22–32)
Calcium: 9.8 mg/dL (ref 8.9–10.3)
Chloride: 105 mmol/L (ref 98–111)
Creatinine, Ser: 0.71 mg/dL (ref 0.61–1.24)
GFR, Estimated: >60 mL/min (ref >60)
Glucose, Bld: 144 mg/dL — ABNORMAL HIGH (ref 70–99)
Potassium: 4.2 mmol/L (ref 3.5–5.1)
Sodium: 141 mmol/L (ref 135–145)

## 2020-06-28 LAB — CBC
HCT: 35.5 % — ABNORMAL LOW (ref 39.0–52.0)
Hemoglobin: 10.7 g/dL — ABNORMAL LOW (ref 13.0–17.0)
MCH: 26.6 pg (ref 26.0–34.0)
MCHC: 30.1 g/dL (ref 30.0–36.0)
MCV: 88.3 fL (ref 80.0–100.0)
Platelets: 183 10*3/uL (ref 150–400)
RBC: 4.02 MIL/uL — ABNORMAL LOW (ref 4.22–5.81)
RDW: 14.6 % (ref 11.5–15.5)
WBC: 5.4 10*3/uL (ref 4.0–10.5)
nRBC: 0 % (ref 0.0–0.2)

## 2020-06-28 LAB — MAGNESIUM: Magnesium: 2 mg/dL (ref 1.7–2.4)

## 2020-06-28 MED ORDER — SENNA 8.6 MG PO TABS
1.0000 | ORAL_TABLET | Freq: Every day | ORAL | Status: DC
  Administered 2020-06-28 – 2020-06-30 (×2): 8.6 mg via ORAL
  Filled 2020-06-28 (×3): qty 1

## 2020-06-28 NOTE — Progress Notes (Cosign Needed)
HD#2 Subjective:  Overnight Events: None  Patient evaluated at bedside this morning. Notes some congestion this morning, helping with suction. State breathing is ok but would feel SOB off O2. Discussed he will likely need to go home with O2. Notes he has had several bowel movements overnight but were mostly all mucous with very little stool. Abdomen still tender and distended. Last passed gas sometime this morning. No nausea, vomiting, or urinary symptoms. Patient requesting medication for diarrhea, discussed bowel are distended and stopping bowel would worsen his symptoms.  Objective:  Vital signs in last 24 hours: Vitals:   06/27/20 1616 06/27/20 2004 06/27/20 2348 06/28/20 0400  BP: 105/65 128/62 (!) 98/49 113/61  Pulse: 87 92 86 87  Resp: (!) 22 (!) 22 20 (!) 23  Temp: 98.2 F (36.8 C) 98 F (36.7 C) 98 F (36.7 C) 98.1 F (36.7 C)  TempSrc: Oral Axillary Axillary Axillary  SpO2: 95% 95% 97% 90%  Weight:      Height:       Supplemental O2: Nasal Cannula SpO2: 90 % O2 Flow Rate (L/min): 2 L/min   Physical Exam:  Physical Exam Constitutional:      Appearance: Normal appearance.  Cardiovascular:     Rate and Rhythm: Normal rate and regular rhythm.  Pulmonary:     Effort: Pulmonary effort is normal. No respiratory distress.  Abdominal:     General: Bowel sounds are normal. There is distension.     Tenderness: There is abdominal tenderness (generalized).  Musculoskeletal:     Right lower leg: Edema (1+) present.     Left lower leg: Edema (1+) present.  Skin:    Capillary Refill: Capillary refill takes less than 2 seconds.  Neurological:     Mental Status: He is alert.    Filed Weights   06/26/20 1136  Weight: 105.8 kg     Intake/Output Summary (Last 24 hours) at 06/28/2020 6734 Last data filed at 06/28/2020 0327 Gross per 24 hour  Intake 1167.69 ml  Output 1750 ml  Net -582.31 ml   Net IO Since Admission: -1,182.31 mL [06/28/20 0607]  Pertinent  Labs: CBC Latest Ref Rng & Units 06/28/2020 06/27/2020 06/26/2020  WBC 4.0 - 10.5 K/uL 5.4 8.9 15.1(H)  Hemoglobin 13.0 - 17.0 g/dL 10.7(L) 11.0(L) 12.1(L)  Hematocrit 39.0 - 52.0 % 35.5(L) 36.6(L) 40.0  Platelets 150 - 400 K/uL 183 176 205    CMP Latest Ref Rng & Units 06/28/2020 06/27/2020 06/26/2020  Glucose 70 - 99 mg/dL 144(H) 209(H) 268(H)  BUN 8 - 23 mg/dL 23 21 19   Creatinine 0.61 - 1.24 mg/dL 0.71 0.77 0.78  Sodium 135 - 145 mmol/L 141 141 140  Potassium 3.5 - 5.1 mmol/L 4.2 2.9(L) 3.8  Chloride 98 - 111 mmol/L 105 107 105  CO2 22 - 32 mmol/L 30 30 24   Calcium 8.9 - 10.3 mg/dL 9.8 9.2 9.2  Total Protein 6.5 - 8.1 g/dL - 5.4(L) 6.0(L)  Total Bilirubin 0.3 - 1.2 mg/dL - 0.7 0.8  Alkaline Phos 38 - 126 U/L - 53 64  AST 15 - 41 U/L - 11(L) 16  ALT 0 - 44 U/L - 11 13    Imaging: DG Abd 1 View  Result Date: 06/27/2020 CLINICAL DATA:  Abdominal pain and bloating. Evaluation for constipation. EXAM: ABDOMEN - 1 VIEW COMPARISON:  CT 05/29/2020. FINDINGS: Gastric distention noted. Colonic distention noted. Moderate stool volume in the colon. No free air. Surgical clips right upper quadrant. Right iliac stent  noted. Contrast in the bladder. Degenerative changes lumbar spine and both hips. IMPRESSION: 1.  Gastric distention. 2. Moderate colonic distention. Colonic ileus could present this fashion. Follow-up exam suggested to demonstrate resolution and to exclude obstruction. No free air. Electronically Signed   By: Marcello Moores  Register   On: 06/27/2020 15:54    Assessment/Plan:   Active Problems:   Acute respiratory failure ([place])   Patient Summary: Kenneth Sheller Burlesonis a 70 y.o.malewith a PMH of CVA, asthma, CAD, CHF, COPD, DVT who presents on hospital day 1 for evaluation of acute respiratory distress secondary to lack of oxygen in the setting of COPD.  Acute respiratory failure COPD Remains stable on 2L. Voice is congested this morning. -Flutter valve -Continue dulera 2 puffs twice  a day -Oxygen as needed  -Follow up sputum culture -Consider outpatient PFTs   Urinary tract infection No urinary symptoms today. 1.7 liters or urine output overnight. Does not appear to be retaining. -Continue keflex  Abdominal pain Abdomen remains distended, tympanic, and diffusely tender. Normal bowel sounds. Has one regular BM over night and since then has passed mostly mucous and is passing gas. KUB with gastric and colonic distention and moderate stool burden. Potassium normalized this morning and Mg wnl. Appears to have partial obstruction without clear etiology. Will continue to monitor. -Senna 8.6 mg daily -Monitor electrolytes   Stage IV Decubitus ulcer - continue wound care - Turn patient q2 hrs  Kenneth Beard, MD 06/28/2020, 6:07 AM Pager: [phone]  Please contact the on call pager after 5 pm and on weekends at [phone].

## 2020-06-28 NOTE — TOC Initial Note (Signed)
Transition of Care [facility]) - Initial/Assessment Note    Patient Details  Name: [name] MRN: [id number] Date of Birth: 1950/06/25  [facility] CM/SW Contact:    Benard Halsted, LCSW Phone Number: 06/28/2020, 4:10 PM  Clinical Narrative:                 CSW received consult for possible SNF placement at time of discharge. CSW spoke with patient. Patient reported that patient's aide/caregiver is currently unable to care for patient at their home given patient's current physical needs and fall risk. Patient expressed understanding of recommendation and is agreeable to SNF placement at time of discharge. Patient reports preference for anywhere but [employer] (where he had been before). Patient has not received the COVID vaccines. Patient expressed being hopeful for SNF and to feel better soon. He provided permission for CSW to contact his caregiver, [name], as he as no family and she has taken care of him for 12 years. No further questions reported at this time.   CSW spoke with caregiver, [name]. She reported being patient's aide (the stare awarded him 3 hours per day but she ended up having to provide 24 hour care). She has been working with DSS to find a long term care facility for patient. CSW will send out referral.    Expected Discharge Plan: [place] Barriers to Discharge: Continued Medical Work up,Insurance Authorization,SNF Pending bed offer   Patient Goals and CMS Choice Patient states their goals for this hospitalization and ongoing recovery are:: SNF CMS Medicare.gov Compare Post Acute Care list provided to:: Patient Choice offered to / list presented to : Patient (caregiver, [name])  Expected Discharge Plan and Services Expected Discharge Plan: [place] In-house Referral: Clinical Social Work   Post Acute Care Choice: [place] Living arrangements for the past 2 months: [place]                                      Prior Living Arrangements/Services Living arrangements for the past 2 months: [place] Lives with:: Self Patient language and need for interpreter reviewed:: Yes Do you feel safe going back to the place where you live?: Yes      Need for Family Participation in Patient Care: Yes (Comment) Care giver support system in place?: Yes (comment) Current home services: Home RN,DME ([facility]) Criminal Activity/Legal Involvement Pertinent to Current Situation/Hospitalization: No - Comment as needed  Activities of Daily Living      Permission Sought/Granted Permission sought to share information with : Facility Contact Representative,Family Supports Permission granted to share information with : Yes, Verbal Permission Granted  Share Information with NAME: [name]  Permission granted to share info w AGENCY: SNFs  Permission granted to share info w Relationship: Friend/caregiver  Permission granted to share info w Contact Information: [phone]  Emotional Assessment Appearance:: Appears stated age Attitude/Demeanor/Rapport: Engaged Affect (typically observed): Accepting,Appropriate Orientation: : Oriented to Self,Oriented to Place,Oriented to  Time,Oriented to Situation Alcohol / Substance Use: Not Applicable Psych Involvement: No (comment)  Admission diagnosis:  Acute respiratory failure (HCC) [J96.00] Respiratory distress [R06.03] SIRS (systemic inflammatory response syndrome) (HCC) [R65.10] HCAP (healthcare-associated pneumonia) [J18.9] Patient Active Problem List   Diagnosis Date Noted  . Acute respiratory failure ([place]) 06/26/2020  . Physical deconditioning 06/20/2020  . Urinary retention 04/18/2020  . Constipation   .  Upper GI bleed   . Chronic osteomyelitis ([place]) 04/12/2020  . Sacral ulcer ([place]) 02/27/2020  . Pleural effusion, left 02/27/2020  . Renal insufficiency 02/27/2020  .  Adenocarcinoma of right lung, stage 1 ([place]) 04/26/2019  . Gastric and duodenal angiodysplasia   . Iron deficiency anemia   . Heme positive stool   . Stenosis of brachiocephalic artery ([place]) 24/10/7351  . Lung nodule, solitary 03/28/2018  . Dementia due to another medical condition, without behavioral disturbance ([place])   . Insomnia 01/29/2016  . Psoriasiform dermatitis 07/12/2015  . Diabetes mellitus ([place]) 03/13/2015  . History of Hemiparesis affecting left side as late effect of cerebrovascular accident ([place]) 02/18/2015  . Coronary artery disease involving native coronary artery of native heart without angina pectoris   . HLD (hyperlipidemia) 11/01/2014  . Tobacco use disorder 11/01/2014  . Essential hypertension   . Polysubstance abuse ([place]) 08/28/2014  . Intracranial carotid stenosis   . History of DVT (deep vein thrombosis)   . Alcohol use disorder, moderate, dependence ([place])   . Stroke ([place])   . Chronic diastolic congestive heart failure ([place]) 11/21/2013  . GERD (gastroesophageal reflux disease) 08/10/2013  . Gout 08/10/2013  . Peripheral vascular disease ([place]) 07/27/2012  . COPD (chronic obstructive pulmonary disease) ([place]) 04/04/2012   PCP:  Gaylan Gerold, DO Pharmacy:   [place], [place] - [address] [place] [address] Phone: [phone] Fax: [phone]  [id number] - [place], [place] [address] Phone: [phone] Fax: [phone]     Social Determinants of Health (SDOH) Interventions    Readmission Risk Interventions Readmission Risk Prevention Plan 08/23/2018  Transportation Screening Complete  PCP or Specialist Appt within 3-5 Days Complete  HRI or Love Not Complete  Palliative Care Screening Complete  Medication Review (RN Care Manager) Referral to Pharmacy  Some recent data might be hidden

## 2020-06-29 DIAGNOSIS — R109 Unspecified abdominal pain: Secondary | ICD-10-CM | POA: Diagnosis not present

## 2020-06-29 DIAGNOSIS — N390 Urinary tract infection, site not specified: Secondary | ICD-10-CM

## 2020-06-29 DIAGNOSIS — R0603 Acute respiratory distress: Secondary | ICD-10-CM

## 2020-06-29 LAB — COMPREHENSIVE METABOLIC PANEL
ALT: 13 U/L (ref 0–44)
AST: 15 U/L (ref 15–41)
Albumin: 2.6 g/dL — ABNORMAL LOW (ref 3.5–5.0)
Alkaline Phosphatase: 58 U/L (ref 38–126)
Anion gap: 4 — ABNORMAL LOW (ref 5–15)
BUN: 24 mg/dL — ABNORMAL HIGH (ref 8–23)
CO2: 30 mmol/L (ref 22–32)
Calcium: 9.8 mg/dL (ref 8.9–10.3)
Chloride: 109 mmol/L (ref 98–111)
Creatinine, Ser: 0.52 mg/dL — ABNORMAL LOW (ref 0.61–1.24)
GFR, Estimated: >60 mL/min (ref >60)
Glucose, Bld: 140 mg/dL — ABNORMAL HIGH (ref 70–99)
Potassium: 4.4 mmol/L (ref 3.5–5.1)
Sodium: 143 mmol/L (ref 135–145)
Total Bilirubin: 0.6 mg/dL (ref 0.3–1.2)
Total Protein: 5.3 g/dL — ABNORMAL LOW (ref 6.5–8.1)

## 2020-06-29 LAB — CBC
HCT: 34.8 % — ABNORMAL LOW (ref 39.0–52.0)
Hemoglobin: 10.6 g/dL — ABNORMAL LOW (ref 13.0–17.0)
MCH: 26.8 pg (ref 26.0–34.0)
MCHC: 30.5 g/dL (ref 30.0–36.0)
MCV: 87.9 fL (ref 80.0–100.0)
Platelets: 170 10*3/uL (ref 150–400)
RBC: 3.96 MIL/uL — ABNORMAL LOW (ref 4.22–5.81)
RDW: 14.4 % (ref 11.5–15.5)
WBC: 4.6 10*3/uL (ref 4.0–10.5)
nRBC: 0 % (ref 0.0–0.2)

## 2020-06-29 MED ORDER — GUAIFENESIN ER 600 MG PO TB12
600.0000 mg | ORAL_TABLET | Freq: Two times a day (BID) | ORAL | Status: DC
  Administered 2020-06-29 (×2): 600 mg via ORAL
  Filled 2020-06-29 (×2): qty 1

## 2020-06-29 NOTE — Evaluation (Signed)
Physical Therapy Evaluation Patient Details Name: [name] MRN: [id number] DOB: March 10, 1950 Today's Date: 06/29/2020   History of Present Illness  70 yo male presenting 5/18 to ED with reports of increasing dyspnea.  SaO2 on RA in 70% in field, Placed on CPAP and improved to 96%O2 pt transitioned to 5L via [place] in ED with SAO2 98-99%O2 Admitted for treatment of Acute repiratory failure and UTI  PMH  CVA (with L-sided weakness), alcohol abuse, prior tobacco and cocaine use, CAD, CHF, COPD, DM II, DVT, OSA on CPAP, PVD, sacral wound with osteomylitis and seizures.  Clinical Impression  PTA pt living at home with [facility] 24/7. Pt requires hoyer lift out of bed. Pt reports that he is "terrible" when PT entered due to "sitting in" BM.  Pt with decreased ROM and strength L-sided weakness from CVA, R side stronger but still 2/5. Pt rolls on L side with min A for R hand to bed rail and maximal vc for sequencing. When rolled pt had not had BM. While in sidelying RN changed sacral dressing. Pt able to roll back on back and bed placed in chair position where he was able to perform AAROM of LE. RN to order air mattress to insure sacral wound does not get worse. PT recommending SNF level rehab. Pt's HHAide has been working to secure long term patient care as she can not care for him at this level any more. PT will continue to follow acutely.     Follow Up Recommendations SNF    Equipment Recommendations  Other (comment) (has necessary equipment at home)    Recommendations for Other Services OT consult     Precautions / Restrictions Precautions Precautions: Fall Restrictions Weight Bearing Restrictions: No      Mobility  Bed Mobility Overal bed mobility: Needs Assistance Bed Mobility: Rolling Rolling: Min assist         General bed mobility comments: min a for reach to bed rail and min A to come all they way to sidelying, able to hold self in sidelying with min A    Transfers Overall  transfer level: Needs assistance               General transfer comment: hoyer lift at baseline         Balance Overall balance assessment:  (maximal assist)                                           Pertinent Vitals/Pain Pain Assessment: Faces Faces Pain Scale: Hurts a little bit Pain Location: generalized with movement Pain Descriptors / Indicators: Grimacing;Guarding Pain Intervention(s): Limited activity within patient's tolerance;Monitored during session;Repositioned    Home Living Family/patient expects to be discharged to:: Private residence Living Arrangements: Alone Available Help at Discharge: Personal care attendant;Available 24 hours/day                  Prior Function Level of Independence: Needs assistance   Gait / Transfers Assistance Needed: utilize lift for OOB mobility  ADL's / Homemaking Assistance Needed: HHAide 24 hr/day        Hand Dominance   Dominant Hand: Right    Extremity/Trunk Assessment   Upper Extremity Assessment Upper Extremity Assessment: LUE deficits/detail;Defer to OT evaluation LUE Deficits / Details: limited mobility and contracture from prior CVA    Lower Extremity Assessment Lower Extremity Assessment: LLE deficits/detail;RLE deficits/detail RLE  Deficits / Details: hip flexion, knee flex and ext, foot plantar and dorsi flexion with limited end range with PROM strength grossly 2/5 LLE Deficits / Details: hip and knee contractures and limited movement, some knee extension, and plantar and dorsiflexion LLE Sensation: decreased light touch;decreased proprioception       Communication   Communication: Expressive difficulties  Cognition Arousal/Alertness: Awake/alert Behavior During Therapy: WFL for tasks assessed/performed Overall Cognitive Status: No family/caregiver present to determine baseline cognitive functioning Area of Impairment: Memory;Awareness;Problem solving;Following commands                      Memory: Decreased short-term memory (poor historian) Following Commands: Follows one step commands with increased time   Awareness: Emergent Problem Solving: Slow processing;Decreased initiation;Requires verbal cues General Comments: pt pleasant and cooperative      General Comments General comments (skin integrity, edema, etc.): VSS on 2L via [place]    Exercises General Exercises - Lower Extremity Ankle Circles/Pumps: Both;AAROM;10 reps;Supine Long Arc Quad: AAROM;Both;10 reps;Supine Hip Flexion/Marching: AAROM;Both;10 reps;Supine   Assessment/Plan    PT Assessment Patient needs continued PT services  PT Problem List Decreased strength;Decreased range of motion;Decreased activity tolerance;Decreased balance;Decreased mobility       PT Treatment Interventions DME instruction;Functional mobility training;Therapeutic activities;Therapeutic exercise;Balance training;Cognitive remediation    PT Goals (Current goals can be found in the Care Plan section)  Acute Rehab PT Goals Patient Stated Goal: feel better PT Goal Formulation: With patient Time For Goal Achievement: 07/12/20 Potential to Achieve Goals: Fair    Frequency Min 2X/week   Barriers to discharge Decreased caregiver support         AM-PAC PT "6 Clicks" Mobility  Outcome Measure Help needed turning from your back to your side while in a flat bed without using bedrails?: A Little Help needed moving from lying on your back to sitting on the side of a flat bed without using bedrails?: Total Help needed moving to and from a bed to a chair (including a wheelchair)?: Total Help needed standing up from a chair using your arms (e.g., wheelchair or bedside chair)?: Total Help needed to walk in hospital room?: Total Help needed climbing 3-5 steps with a railing? : Total 6 Click Score: 8    End of Session Equipment Utilized During Treatment: Oxygen Activity Tolerance: Patient tolerated treatment  well Patient left: in bed;with call bell/phone within reach;with bed alarm set;Other (comment) (placed in chair position with Prevalon boots off to allow for ROM while sitting up) Nurse Communication: Mobility status;Other (comment) (need for air mattress) PT Visit Diagnosis: Other abnormalities of gait and mobility (R26.89);Muscle weakness (generalized) (M62.81);Difficulty in walking, not elsewhere classified (R26.2)    Time: 3646-8032 PT Time Calculation (min) (ACUTE ONLY): 19 min   Charges:   PT Evaluation $PT Eval Moderate Complexity: 1 Mod          Shereda Graw B. Migdalia Dk PT, DPT Acute Rehabilitation Services Pager [phone] Office [phone]   [place] 06/29/2020, 2:45 PM

## 2020-06-29 NOTE — Progress Notes (Addendum)
HD#3 Subjective:  Overnight Events: None  Patient evaluated at bedside this morning. He states he is doing well, and is glad the care team is present because it's "lonely" in the hospital. He states that he is feeling better than yesterday, but knows he is still sick since he is coughing up sputum. He is passing flatus and continues to urinate. No other questions or concerns this AM.   Objective:  Vital signs in last 24 hours: Vitals:   06/28/20 1721 06/28/20 1945 06/28/20 2301 06/29/20 0313  BP: 137/65 (!) 126/59 (!) 121/49 117/63  Pulse: 90 85 82 84  Resp: 18 20 20  (!) 22  Temp: 97.9 F (36.6 C) 98 F (36.7 C) 98 F (36.7 C) 97.9 F (36.6 C)  TempSrc: Oral Oral Oral Oral  SpO2: 96% 95% 97% 94%  Weight:      Height:       Supplemental O2: Nasal Cannula SpO2: 94 % O2 Flow Rate (L/min): 2 L/min   Physical Exam:  Physical Exam Constitutional:      General: He is not in acute distress.    Appearance: Normal appearance. He is obese. He is not diaphoretic.     Comments: chronically appearing gentleman. Answers questions appropriately.    Cardiovascular:     Rate and Rhythm: Normal rate and regular rhythm.     Pulses: Normal pulses.     Heart sounds: Normal heart sounds.  Pulmonary:     Effort: Pulmonary effort is normal. No respiratory distress.     Comments: Upper breath sounds appreciated.  Abdominal:     General: Bowel sounds are normal. There is distension.     Tenderness: There is no abdominal tenderness (generalized).  Musculoskeletal:     Right lower leg: Edema (1+) present.     Left lower leg: Edema (1+) present.  Skin:    Capillary Refill: Capillary refill takes less than 2 seconds.  Neurological:     Mental Status: He is alert.    Filed Weights   06/26/20 1136  Weight: 105.8 kg     Intake/Output Summary (Last 24 hours) at 06/29/2020 0622 Last data filed at 06/29/2020 6063 Gross per 24 hour  Intake 600 ml  Output 1975 ml  Net -1375 ml   Net IO  Since Admission: -2,557.31 mL [06/29/20 0622]  Pertinent Labs: CBC Latest Ref Rng & Units 06/29/2020 06/28/2020 06/27/2020  WBC 4.0 - 10.5 K/uL 4.6 5.4 8.9  Hemoglobin 13.0 - 17.0 g/dL 10.6(L) 10.7(L) 11.0(L)  Hematocrit 39.0 - 52.0 % 34.8(L) 35.5(L) 36.6(L)  Platelets 150 - 400 K/uL 170 183 176    CMP Latest Ref Rng & Units 06/29/2020 06/28/2020 06/27/2020  Glucose 70 - 99 mg/dL 140(H) 144(H) 209(H)  BUN 8 - 23 mg/dL 24(H) 23 21  Creatinine 0.61 - 1.24 mg/dL 0.52(L) 0.71 0.77  Sodium 135 - 145 mmol/L 143 141 141  Potassium 3.5 - 5.1 mmol/L 4.4 4.2 2.9(L)  Chloride 98 - 111 mmol/L 109 105 107  CO2 22 - 32 mmol/L 30 30 30   Calcium 8.9 - 10.3 mg/dL 9.8 9.8 9.2  Total Protein 6.5 - 8.1 g/dL 5.3(L) - 5.4(L)  Total Bilirubin 0.3 - 1.2 mg/dL 0.6 - 0.7  Alkaline Phos 38 - 126 U/L 58 - 53  AST 15 - 41 U/L 15 - 11(L)  ALT 0 - 44 U/L 13 - 11    Intake/Output Summary (Last 24 hours) at 06/29/2020 0160 Last data filed at 06/29/2020 1093 Gross per 24 hour  Intake 600 ml  Output 1975 ml  Net -1375 ml   Imaging: DG Abd 1 View  Result Date: 06/27/2020 CLINICAL DATA:  Abdominal pain and bloating. Evaluation for constipation. EXAM: ABDOMEN - 1 VIEW COMPARISON:  CT 05/29/2020. FINDINGS: Gastric distention noted. Colonic distention noted. Moderate stool volume in the colon. No free air. Surgical clips right upper quadrant. Right iliac stent noted. Contrast in the bladder. Degenerative changes lumbar spine and both hips. IMPRESSION: 1.  Gastric distention. 2. Moderate colonic distention. Colonic ileus could present this fashion. Follow-up exam suggested to demonstrate resolution and to exclude obstruction. No free air. Electronically Signed   By: Marcello Moores  Register   On: 06/27/2020 15:54    Assessment/Plan:   Active Problems:   Acute respiratory failure ([place])   Patient Summary: Kenneth Stambaugh Burlesonis a 70 y.o.malewith a PMH of CVA, asthma, CAD, CHF, COPD, DVT who presents on hospital day 1 for  evaluation of acute respiratory distress secondary to lack of oxygen in the setting of COPD.  Acute Respiratory Failure likely 2/2 to Not Having Access to Home O2.  COPD Remains stable on 2L. He is stable, will DC telemetry today and downgrade to med-surg.  -Flutter valve - Mucinex 600 twice dialy -Continue dulera 2 puffs twice a day -Oxygen as needed with goal of 88-92%. -Follow up sputum culture -Consider outpatient PFTs   Urinary tract infection No urinary symptoms today. 1.3L total output ON. Completed 2 days of vanc/cef. 1 day of Keflex.  -Continue keflex (day 3/5)  Abdominal pain Abdomen still distended, but patient endorses passing gas, and mucus. Nontender to palpation and BS+ in all quadrants.  Appears to have partial obstruction without clear etiology. Will continue to monitor. -Senna 8.6 mg daily -Monitor electrolytes   Stage IV Decubitus ulcer - continue wound care - Turn patient q2 hrs  Maudie Mercury, MD 06/29/2020, 6:22 AM Pager: [phone]  Please contact the on call pager after 5 pm and on weekends at [phone].

## 2020-06-29 NOTE — Plan of Care (Signed)
  Problem: Clinical Measurements: Goal: Will remain free from infection Outcome: Progressing Goal: Diagnostic test results will improve Outcome: Progressing Goal: Respiratory complications will improve Outcome: Progressing   Problem: Activity: Goal: Risk for activity intolerance will decrease Outcome: Progressing   Problem: Pain Managment: Goal: General experience of comfort will improve Outcome: Progressing

## 2020-06-30 LAB — BASIC METABOLIC PANEL
Anion gap: 4 — ABNORMAL LOW (ref 5–15)
BUN: 22 mg/dL (ref 8–23)
CO2: 31 mmol/L (ref 22–32)
Calcium: 9.8 mg/dL (ref 8.9–10.3)
Chloride: 109 mmol/L (ref 98–111)
Creatinine, Ser: 0.44 mg/dL — ABNORMAL LOW (ref 0.61–1.24)
GFR, Estimated: >60 mL/min (ref >60)
Glucose, Bld: 140 mg/dL — ABNORMAL HIGH (ref 70–99)
Potassium: 4.2 mmol/L (ref 3.5–5.1)
Sodium: 144 mmol/L (ref 135–145)

## 2020-06-30 LAB — CBC
HCT: 34.8 % — ABNORMAL LOW (ref 39.0–52.0)
Hemoglobin: 10.4 g/dL — ABNORMAL LOW (ref 13.0–17.0)
MCH: 26.4 pg (ref 26.0–34.0)
MCHC: 29.9 g/dL — ABNORMAL LOW (ref 30.0–36.0)
MCV: 88.3 fL (ref 80.0–100.0)
Platelets: 184 10*3/uL (ref 150–400)
RBC: 3.94 MIL/uL — ABNORMAL LOW (ref 4.22–5.81)
RDW: 14.5 % (ref 11.5–15.5)
WBC: 4.5 10*3/uL (ref 4.0–10.5)
nRBC: 0 % (ref 0.0–0.2)

## 2020-06-30 MED ORDER — GUAIFENESIN ER 600 MG PO TB12
1200.0000 mg | ORAL_TABLET | Freq: Two times a day (BID) | ORAL | Status: DC
  Administered 2020-06-30 – 2020-07-09 (×19): 1200 mg via ORAL
  Filled 2020-06-30 (×19): qty 2

## 2020-06-30 NOTE — Evaluation (Signed)
Occupational Therapy Evaluation Patient Details Name: [name] MRN: [id number] DOB: 02/21/1950 Today's Date: 06/30/2020    History of Present Illness 70 yo male presenting 5/18 to ED with reports of increasing dyspnea.  SaO2 on RA in 70% in field, Placed on CPAP and improved to 96%O2 pt transitioned to 5L via [place] in ED with SAO2 98-99%O2 Admitted for treatment of Acute repiratory failure and UTI  PMH  CVA (with L-sided weakness), alcohol abuse, prior tobacco and cocaine use, CAD, CHF, COPD, DM II, DVT, OSA on CPAP, PVD, sacral wound with osteomylitis and seizures.   Clinical Impression   PTA patient was living in a private residence with 24/7 assist from [facility] aide. Patient states that he is now requiring more assist than can be provided by his aide. Patient currently requires Total A grossly for ADLs/IADLs with the exception of self-feeding. Patient also limited by deficits listed below including significant L hemiplegia, BLE edema, and pain and would benefit from continued acute OT services in prep for safe d/c to next level of care. Given patient CLOF and deficits, recommendation for SNF rehab. OT will continue to follow acutely.      Follow Up Recommendations  SNF;Supervision/Assistance - 24 hour    Equipment Recommendations  Other (comment) (Defer to next level of care.)    Recommendations for Other Services       Precautions / Restrictions Precautions Precautions: Fall Restrictions Weight Bearing Restrictions: No      Mobility Bed Mobility Overal bed mobility: Needs Assistance Bed Mobility: Rolling Rolling: Mod assist;Total assist         General bed mobility comments: Mod A for rolling L and Total A for rolling Rangel with use of chuck pad.    Transfers Overall transfer level: Needs assistance               General transfer comment: hoyer lift at baseline    Balance                                           ADL either performed or  assessed with clinical judgement   ADL Overall ADL's : Needs assistance/impaired Eating/Feeding: Minimal assistance;Sitting Eating/Feeding Details (indicate cue type and reason): Min A and moderate spillage with LUE.                 Lower Body Dressing: Total assistance;Bed level       Toileting- Clothing Manipulation and Hygiene: Total assistance;Bed level               Vision Patient Visual Report: No change from baseline Vision Assessment?: No apparent visual deficits     Perception     Praxis      Pertinent Vitals/Pain Pain Assessment: Faces Faces Pain Scale: Hurts a little bit Pain Location: L knee Pain Descriptors / Indicators: Grimacing;Guarding Pain Intervention(s): Limited activity within patient's tolerance;Monitored during session;Repositioned     Hand Dominance Right   Extremity/Trunk Assessment Upper Extremity Assessment Upper Extremity Assessment: LUE deficits/detail LUE Deficits / Details: limited mobility and contractures at elbow, wrist and digits from prior CVA. Large sublixation noted at L shoulder joint. LUE Sensation: decreased light touch;decreased proprioception LUE Coordination: decreased fine motor;decreased gross motor           Communication Communication Communication: Expressive difficulties   Cognition Arousal/Alertness: Awake/alert Behavior During Therapy: WFL for tasks assessed/performed Overall Cognitive Status:  No family/caregiver present to determine baseline cognitive functioning Area of Impairment: Memory;Awareness;Problem solving;Following commands                     Memory: Decreased short-term memory (poor historian) Following Commands: Follows one step commands with increased time   Awareness: Emergent Problem Solving: Slow processing;Decreased initiation;Requires verbal cues General Comments: Pt pleasant and cooperative. Follows commands appropriately.   General Comments  VSS on 2L O2 via [place].     Exercises     Shoulder Instructions      Home Living Family/patient expects to be discharged to:: Private residence Living Arrangements: Alone Available Help at Discharge: Personal care attendant;Available 24 hours/day                                    Prior Functioning/Environment Level of Independence: Needs assistance  Gait / Transfers Assistance Needed: utilize lift for OOB mobility ADL's / Homemaking Assistance Needed: HHAide 24 hr/day Communication / Swallowing Assistance Needed: garbled at times          OT Problem List: Decreased strength;Decreased range of motion;Decreased activity tolerance;Impaired balance (sitting and/or standing);Decreased coordination;Decreased safety awareness;Decreased knowledge of use of DME or AE;Impaired sensation;Impaired tone;Obesity;Impaired UE functional use;Pain;Increased edema      OT Treatment/Interventions: Self-care/ADL training;Therapeutic exercise;Neuromuscular education;Energy conservation;DME and/or AE instruction;Splinting;Therapeutic activities;Patient/family education    OT Goals(Current goals can be found in the care plan section) Acute Rehab OT Goals Patient Stated Goal: To get to recliner. OT Goal Formulation: With patient Time For Goal Achievement: 07/14/20 Potential to Achieve Goals: Fair ADL Goals Pt Will Perform Eating: with set-up;sitting Pt Will Perform Grooming: with set-up;sitting;with adaptive equipment Additional ADL Goal #1: Patient will roll to L in supine with Min A to decrease caregiver burden in prep for bed level ADLs. Additional ADL Goal #2: Patient will tolerate L resting hand splint for 8 hours without signs/symptoms of skin breakdown to decrease risk of further contractures and maintain skin integrity.  OT Frequency: Min 2X/week   Barriers to D/C: Decreased caregiver support  Caregiver unable to provide current level of assist.       Co-evaluation              AM-PAC OT "6  Clicks" Daily Activity     Outcome Measure Help from another person eating meals?: A Little Help from another person taking care of personal grooming?: A Lot Help from another person toileting, which includes using toliet, bedpan, or urinal?: Total Help from another person bathing (including washing, rinsing, drying)?: Total Help from another person to put on and taking off regular upper body clothing?: Total Help from another person to put on and taking off regular lower body clothing?: Total 6 Click Score: 9   End of Session Equipment Utilized During Treatment: Oxygen;Other (comment) Harrel Lemon lift to chair) Nurse Communication: Need for lift equipment;Other (comment) (Hoyer lift +2)  Activity Tolerance: Patient tolerated treatment well Patient left: in chair;with call bell/phone within reach;with chair alarm set  OT Visit Diagnosis: Muscle weakness (generalized) (M62.81);Hemiplegia and hemiparesis;Pain Hemiplegia - Right/Left: Left Hemiplegia - dominant/non-dominant: Non-Dominant Hemiplegia - caused by: Cerebral infarction Pain - Right/Left: Left Pain - part of body: Knee;Leg                Time: 6314-9702 OT Time Calculation (min): 37 min Charges:  OT General Charges $OT Visit: 1 Visit OT Evaluation $OT Eval Moderate Complexity: 1 Mod  OT Treatments $Therapeutic Activity: 8-22 mins  Kenneth Haji H. OTR/L Supplemental OT, Department of rehab services [phone]  [name] H. 06/30/2020, 1:48 PM

## 2020-06-30 NOTE — Progress Notes (Addendum)
HD#4 Subjective:  Overnight Events: None  Examined at bedside this morning. Sitting up and eating breakfast this morning. States he feels congested this morning and coughing up sputum. Feels that the mucinex is helping. Demonstrated flutter valve at bedside with good sputum clearance. Abdominal pain is improved today, continues to urinate well, passing flatus.   Objective:  Vital signs in last 24 hours: Vitals:   06/29/20 1228 06/29/20 1555 06/29/20 2000 06/30/20 0410  BP: 132/67 136/60 122/61 100/64  Pulse: 83 85  85  Resp: (!) 24 (!) 22 (!) 21 18  Temp: 99.1 F (37.3 C) 98.9 F (37.2 C) 98.6 F (37 C) 98.7 F (37.1 C)  TempSrc: Oral Oral Oral Oral  SpO2: 93%  95% 94%  Weight:      Height:       Supplemental O2: Nasal Cannula SpO2: 94 % O2 Flow Rate (L/min): 2 L/min   Physical Exam:  Physical Exam Constitutional:      General: He is not in acute distress.    Appearance: Normal appearance. He is obese. He is not diaphoretic.     Comments: chronically appearing gentleman. Answers questions appropriately.    Cardiovascular:     Rate and Rhythm: Normal rate and regular rhythm.     Pulses: Normal pulses.     Heart sounds: Normal heart sounds.  Pulmonary:     Effort: Pulmonary effort is normal. No respiratory distress.     Comments: Inspiratory and expiratory rhonchi diffusely, improved with coughing Abdominal:     General: Bowel sounds are normal. There is distension (slightly softer).     Tenderness: There is no abdominal tenderness (generalized).  Musculoskeletal:     Right lower leg: Edema (1+) present.     Left lower leg: Edema (1+) present.  Skin:    Capillary Refill: Capillary refill takes less than 2 seconds.  Neurological:     Mental Status: He is alert.    Filed Weights   06/26/20 1136  Weight: 105.8 kg     Intake/Output Summary (Last 24 hours) at 06/30/2020 0622 Last data filed at 06/30/2020 0500 Gross per 24 hour  Intake 360 ml  Output 2775 ml   Net -2415 ml   Net IO Since Admission: -4,972.31 mL [06/30/20 0622]  Pertinent Labs: CBC Latest Ref Rng & Units 06/29/2020 06/28/2020 06/27/2020  WBC 4.0 - 10.5 K/uL 4.6 5.4 8.9  Hemoglobin 13.0 - 17.0 g/dL 10.6(L) 10.7(L) 11.0(L)  Hematocrit 39.0 - 52.0 % 34.8(L) 35.5(L) 36.6(L)  Platelets 150 - 400 K/uL 170 183 176    CMP Latest Ref Rng & Units 06/29/2020 06/28/2020 06/27/2020  Glucose 70 - 99 mg/dL 140(H) 144(H) 209(H)  BUN 8 - 23 mg/dL 24(H) 23 21  Creatinine 0.61 - 1.24 mg/dL 0.52(L) 0.71 0.77  Sodium 135 - 145 mmol/L 143 141 141  Potassium 3.5 - 5.1 mmol/L 4.4 4.2 2.9(L)  Chloride 98 - 111 mmol/L 109 105 107  CO2 22 - 32 mmol/L 30 30 30   Calcium 8.9 - 10.3 mg/dL 9.8 9.8 9.2  Total Protein 6.5 - 8.1 g/dL 5.3(L) - 5.4(L)  Total Bilirubin 0.3 - 1.2 mg/dL 0.6 - 0.7  Alkaline Phos 38 - 126 U/L 58 - 53  AST 15 - 41 U/L 15 - 11(L)  ALT 0 - 44 U/L 13 - 11    Intake/Output Summary (Last 24 hours) at 06/30/2020 0622 Last data filed at 06/30/2020 0500 Gross per 24 hour  Intake 360 ml  Output 2775 ml  Net -2415 ml   Imaging: DG Abd 1 View  Result Date: 06/27/2020 CLINICAL DATA:  Abdominal pain and bloating. Evaluation for constipation. EXAM: ABDOMEN - 1 VIEW COMPARISON:  CT 05/29/2020. FINDINGS: Gastric distention noted. Colonic distention noted. Moderate stool volume in the colon. No free air. Surgical clips right upper quadrant. Right iliac stent noted. Contrast in the bladder. Degenerative changes lumbar spine and both hips. IMPRESSION: 1.  Gastric distention. 2. Moderate colonic distention. Colonic ileus could present this fashion. Follow-up exam suggested to demonstrate resolution and to exclude obstruction. No free air. Electronically Signed   By: Marcello Moores  Register   On: 06/27/2020 15:54    Assessment/Plan:   Active Problems:   Acute respiratory failure ([place])   Patient Summary: Desi Rowe Burlesonis a 70 y.o.malewith a PMH of CVA, asthma, CAD, CHF, COPD, DVT who presents on  hospital day 1 for evaluation of acute respiratory distress secondary to lack of oxygen in the setting of COPD.  Acute Respiratory Failure likely 2/2 to Not Having Access to Home O2.  COPD Remains stable on 2L.  -Flutter valve -Mucinex 1.2g twice dialy -Continue dulera 2 puffs twice a day -Oxygen as needed with goal of 88-92%. -Consider outpatient PFTs   Urinary tract infection No urinary symptoms today. 2.7L total output overnight. Completed 2 days of vanc/cef, now on keflex.  -Continue keflex (day 4/5)  Abdominal pain Abdominal distended very mildy improved, but patient endorses passing gas, and mucus. Nontender to palpation and BS+ in all quadrants.  Appears to have partial obstruction without clear etiology. Will continue to monitor. -Senna 8.6 mg daily -Monitor electrolytes   Stage IV Decubitus ulcer - continue wound care - Turn patient q2 hrs  Iona Beard, MD 06/30/2020, 6:22 AM Pager: [phone]  Please contact the on call pager after 5 pm and on weekends at [phone].

## 2020-06-30 NOTE — Plan of Care (Signed)

## 2020-07-01 ENCOUNTER — Other Ambulatory Visit: Payer: Self-pay

## 2020-07-01 ENCOUNTER — Ambulatory Visit: Payer: Medicare Other | Admitting: Internal Medicine

## 2020-07-01 ENCOUNTER — Inpatient Hospital Stay (HOSPITAL_COMMUNITY): Payer: Medicare Other

## 2020-07-01 ENCOUNTER — Encounter (HOSPITAL_COMMUNITY): Payer: Self-pay | Admitting: Internal Medicine

## 2020-07-01 DIAGNOSIS — M25512 Pain in left shoulder: Secondary | ICD-10-CM

## 2020-07-01 DIAGNOSIS — R0603 Acute respiratory distress: Secondary | ICD-10-CM | POA: Diagnosis not present

## 2020-07-01 DIAGNOSIS — R109 Unspecified abdominal pain: Secondary | ICD-10-CM | POA: Diagnosis not present

## 2020-07-01 DIAGNOSIS — N390 Urinary tract infection, site not specified: Secondary | ICD-10-CM | POA: Diagnosis not present

## 2020-07-01 LAB — BASIC METABOLIC PANEL
Anion gap: 6 (ref 5–15)
BUN: 25 mg/dL — ABNORMAL HIGH (ref 8–23)
CO2: 30 mmol/L (ref 22–32)
Calcium: 9.9 mg/dL (ref 8.9–10.3)
Chloride: 108 mmol/L (ref 98–111)
Creatinine, Ser: 0.61 mg/dL (ref 0.61–1.24)
GFR, Estimated: >60 mL/min (ref >60)
Glucose, Bld: 123 mg/dL — ABNORMAL HIGH (ref 70–99)
Potassium: 4.4 mmol/L (ref 3.5–5.1)
Sodium: 144 mmol/L (ref 135–145)

## 2020-07-01 LAB — CBC
HCT: 34.7 % — ABNORMAL LOW (ref 39.0–52.0)
Hemoglobin: 10.6 g/dL — ABNORMAL LOW (ref 13.0–17.0)
MCH: 26.5 pg (ref 26.0–34.0)
MCHC: 30.5 g/dL (ref 30.0–36.0)
MCV: 86.8 fL (ref 80.0–100.0)
Platelets: 172 10*3/uL (ref 150–400)
RBC: 4 MIL/uL — ABNORMAL LOW (ref 4.22–5.81)
RDW: 14.5 % (ref 11.5–15.5)
WBC: 5 10*3/uL (ref 4.0–10.5)
nRBC: 0 % (ref 0.0–0.2)

## 2020-07-01 MED ORDER — SENNA 8.6 MG PO TABS: 1.0000 | ORAL_TABLET | Freq: Every evening | ORAL | Status: DC | PRN

## 2020-07-01 MED ORDER — ACETAMINOPHEN 500 MG PO TABS
1000.0000 mg | ORAL_TABLET | Freq: Three times a day (TID) | ORAL | Status: DC
  Administered 2020-07-01 – 2020-07-09 (×23): 1000 mg via ORAL
  Filled 2020-07-01 (×25): qty 2

## 2020-07-01 MED ORDER — BISACODYL 10 MG RE SUPP
10.0000 mg | Freq: Once | RECTAL | Status: AC
  Administered 2020-07-01: 10 mg via RECTAL
  Filled 2020-07-01: qty 1

## 2020-07-01 NOTE — Consult Note (Signed)
   [facility] Inpatient Consult   07/01/2020  KO BARDON [birth year] [id number]  [place] Organization [ACO] Patient: [place] Medicare  Primary Care Provider: Angelica Pou, MD [facility] [Embedded]  Patient was screened for [place] Management services and found patient is active in an Embedded practice chronic care management program with RN CCM at [facility]. Plan: Will follow patient for progress and notify the Embedded Chronic Care team of admission and disposition/transitional needs.  This provider office is listed to do the transition of care follow up calls and appointments.  Please contact for further questions,  Natividad Brood, RN BSN [facility] Liaison  [phone] business mobile phone Toll free office [phone]  Fax number: [phone] [email or web address]

## 2020-07-01 NOTE — TOC Progression Note (Addendum)
Transition of Care [facility]) - Progression Note    Patient Details  Name: [name] MRN: [id number] Date of Birth: 05/18/1950  Transition of Care [facility]) CM/SW [place], LCSW Phone Number: 07/01/2020, 9:39 AM  Clinical Narrative:    9:39am-CSW checking with [place], and [place] to see if they can offer long term care beds.   1pm-CSW spoke with patient and provided bed offers and Medicare ratings list. He requested CSW contact [name] regarding facility choice. CSW left voicemail for [name] and call was returned. CSW provided her with the SNF bed offers and she has selected [facility] even though it is 45 minutes away from her home. She reported that she will still be involved in patient's care and expressed distress at having to send him to SNF but simply cannot continue providing maximum care. CSW accepted bed offer with [facility] and they are going to check to make sure they have a quarantine bed since patient is not vaccinated.    Expected Discharge Plan: [place] Barriers to Discharge: Continued Medical Work up,Insurance Authorization,SNF Pending bed offer  Expected Discharge Plan and Services Expected Discharge Plan: [place] In-house Referral: Clinical Social Work   Post Acute Care Choice: [place] Living arrangements for the past 2 months: [place]                                       Social Determinants of Health (SDOH) Interventions Housing Interventions: Intervention Not Indicated  Readmission Risk Interventions Readmission Risk Prevention Plan 08/23/2018  Transportation Screening Complete  PCP or Specialist Appt within 3-5 Days Complete  HRI or [place] Not Complete  Palliative Care Screening Complete  Medication Review (RN Care Manager) Referral to Pharmacy  Some recent data might be hidden

## 2020-07-01 NOTE — Care Management Important Message (Signed)
Important Message  Patient Details  Name: ROLF FELLS MRN: [id number] Date of Birth: [birth year]   Medicare Important Message Given:  Yes     Barb Merino Cataio 07/01/2020, 2:33 PM

## 2020-07-01 NOTE — Progress Notes (Addendum)
Subjective:  [name] is feeling better today overall because his breathing is better. He continues to produce sputum and uses his suction to remove it. He has been using his flutter device consistently, and it has helped. He does endorse pain in his sacral wound. He said it was changed earlier this morning. He also has a burning pain around his left shoulder blade that is new in the hospital but has happened before since his stroke occurred. He states it bothers him most when he lays on it or presses on it. The pain is only in that area. He also states that he has been having multiple bowel movements that are mucous-like in quality. He does not notice when he is about to have a bowel movement; "it just happens." He has also been passing lots of gas. He has been urinating and experiencing some burning after some skin was pulled off during a catheter change.  Objective:  Vital signs in last 24 hours: Vitals:   06/30/20 1859 06/30/20 2037 07/01/20 0435 07/01/20 0940  BP:  133/61 (!) 122/46   Pulse:  100 91   Resp:  18 18   Temp: 98.7 F (37.1 C) 98.7 F (37.1 C) 98 F (36.7 C)   TempSrc: Oral Oral Oral   SpO2:  98% 94% 97%  Weight:      Height:       Weight change:   Intake/Output Summary (Last 24 hours) at 07/01/2020 1156 Last data filed at 07/01/2020 0935 Gross per 24 hour  Intake 1200 ml  Output 2250 ml  Net -1050 ml   Supplemental O2: Nasal cannula, 2 L/min  Physical Exam Constitutional:      Comments: Patient is chronically ill appearing. He is laying back in his bed with nasal cannula incorrectly positioned off his nose. He is alert and oriented x3. He is cooperative and conversant.  Cardiovascular:     Rate and Rhythm: Normal rate.     Comments: Heart sounds difficult to auscultate due to body habitus. Very mild pitting edema of lower extremities bilaterally. Pulmonary:     Comments: Breathing effort is normal. Rhonchi auscultated in all anterior and posterior lung  fields.  Abdominal:     Comments: Abdomen is protuberant. It is soft but firm. Resonant to percussion over midline; dull to percussion over lateral abdominal wall bilaterally. No tenderness to palpation appreciated, just subjective firmness. Bowel sounds are hypoactive.  Musculoskeletal:     Comments: Localized burning pain to palpation of left trapezius. Pain also experienced upon laying in left lateral decubitus position. Pain on shoulder flexion as well as internal and external rotation. Contracture noted of L elbow; unable to extend past 150 degrees.  Feet:     Comments: No pressure sores bilaterally. Toenails long and thick bilaterally.   Assessment/Plan:  Active Problems:   Acute respiratory failure ([place])  Patient Summary: Kenneth Needs Burlesonis a 70 y.o.malewith a PMH of CVA, asthma, CAD, CHF, COPD, DVT who presentson hospital day 5 for evaluation of acute respiratory distress secondary to lack of oxygen in the setting of COPD.  1. Acute respiratory failure Stable on 2L nasal cannula. SpO2 97%. Sputum production continues, but mucinex, flutter device, and suction helps. Respiratory failure resolved. Chest x-ray on admission showed no changes from 6 months ago; however, x-rays have some slight changes from previous films. Patient has also been experiencing persistent rhonchi in all lung fields. CXR today demonstrates persistent bibasilar opacities in right greater than left with interval clearing  from previous CXR, more scarring of right mid lung, and persistent bilateral pleural thickening in right lung greater than left that is consistent with subpleural fat but could be trace effusion. Overall clinical picture is consistent with improving clinical status in the setting of chronic COPD. -Continue flutter device, suction -Mucinex 1200 mg -Dulera 2 puffs daily -Oxygen supplementation, 2L nasal cannula  2. UTI Today is day 5/5 on keflex. Does endorse burning with urination, likely due  to local trauma on catheter removal. Urine is light yellow, indicating good hydration status. -Finish keflex -Monitor urinary symptoms; if recurrent dysuria, frequency, suprapubic pain, will get follow up UA  3. Abdominal pain Patient's abdomen still protuberant. He denies any abdominal pain. He has had multiple bowel movements that were mucus-like in quality. He has been experiencing fecal incontinence. He has been passing a lot of gas. Bowel sounds are hypoactive. Previous KUB showed gastric distention and colonic ileus with stool burden. KUB today shows large colonic stool burden, air-filled appearance of much of the small bowel with fold thickening of jejunal loops which could be enteritis, and air distention of the stomach. He is afebrile. Overall picture concerning for ileus and constipation. -Bisacodyl suppository 10 mg to aid in bowel movements -Senna 8.6 mg prn -Will assess improvement of stool burden  4. Decubitus ulcer on midline sacrum Patient reports his bottom is hurting him. The dressing was changed earlier this morning. Wound was not visualized today since new dressing was just applied. -Will observe sacral wound tomorrow morning -Tylenol 1000 mg TID for pain  5. Post-stroke left shoulder pain Patient endorses pain in left trapezius muscle that has not changed over the last couple of days. He states it is worse when he presses on it or rolls over on it. It has happened many times since he had his stroke. His left arm has 0/5 weakness with noticeable contracture of the elbow. Due to pain only with pressure, muscle weakness with limited ROM, and elbow contracture in the setting of multiple episodes after CVA, suspect this is shoulder pain 2/2 stroke. -Tylenol 1000 mg TID for pain -Continue home dose of Pregabalin 150 mg BID for pain -Consider trigger point injections if pain remains uncontrolled -Reduce pressure on area -Monitor symptoms   LOS: 5 days   Mikal Plane, Medical  Student 07/01/2020, 11:56 AM Pager: [phone] After 5pm on weekdays and 1pm on weekends: On Call pager [phone]

## 2020-07-02 DIAGNOSIS — K5900 Constipation, unspecified: Secondary | ICD-10-CM | POA: Diagnosis not present

## 2020-07-02 DIAGNOSIS — K567 Ileus, unspecified: Secondary | ICD-10-CM | POA: Diagnosis not present

## 2020-07-02 DIAGNOSIS — F32A Depression, unspecified: Secondary | ICD-10-CM

## 2020-07-02 DIAGNOSIS — N390 Urinary tract infection, site not specified: Secondary | ICD-10-CM | POA: Diagnosis not present

## 2020-07-02 DIAGNOSIS — R0603 Acute respiratory distress: Secondary | ICD-10-CM | POA: Diagnosis not present

## 2020-07-02 LAB — SARS CORONAVIRUS 2 (TAT 6-24 HRS): SARS Coronavirus 2: NEGATIVE

## 2020-07-02 MED ORDER — DULOXETINE HCL 60 MG PO CPEP
60.0000 mg | ORAL_CAPSULE | Freq: Every day | ORAL | Status: DC
  Administered 2020-07-02 – 2020-07-08 (×7): 60 mg via ORAL
  Filled 2020-07-02 (×6): qty 1

## 2020-07-02 MED ORDER — PREDNISOLONE 5 MG PO TABS
10.0000 mg | ORAL_TABLET | Freq: Once | ORAL | Status: AC
  Administered 2020-07-02: 10 mg via ORAL
  Filled 2020-07-02: qty 2

## 2020-07-02 MED ORDER — LIDOCAINE 5 % EX PTCH
1.0000 | MEDICATED_PATCH | CUTANEOUS | Status: DC
  Administered 2020-07-02 – 2020-07-09 (×8): 1 via TRANSDERMAL
  Filled 2020-07-02 (×8): qty 1

## 2020-07-02 MED ORDER — SENNOSIDES-DOCUSATE SODIUM 8.6-50 MG PO TABS
2.0000 | ORAL_TABLET | Freq: Two times a day (BID) | ORAL | Status: DC
  Administered 2020-07-02 – 2020-07-08 (×12): 2 via ORAL
  Filled 2020-07-02 (×14): qty 2

## 2020-07-02 NOTE — Progress Notes (Addendum)
Subjective:  [name] continues to breathe better today. He continues to use his flutter device, mucinex, and suction for control of sputum. He does still endorse left trapezius pain. It kept him up most of the night last night. He states that Tylenol and Lyrica has not helped his pain. He had one small bowel movement of the same mucus quality last night after his suppository. He says his abdomen still feels tight and painful. He states that the discomfort with urination he has been experiencing is still present and began before his previous external catheter was removed and caused the local tissue trauma after being stuck to his penis.  Later today, I had a conversation with [name] about how he has been feeling while in the hospital. He stated how frustrated he was with his condition--from being more independent and mobile before his stroke to after his stroke and now. He is in constant, unremitting pain of his left shoulder, epigastric and abdominal area, and his sacral ulcer. He disclosed that he has been having thoughts of dying by suicide "if things keep getting worse," but it does not seem like he has created a plan to do so. He also mentioned he has had "multiple" past attempts to die by suicide, one of which caused him to need a tracheotomy. He also voiced frustration with the care he received at his previous SNF, saying at least here in the hospital he could "see people and daylight." He also feels as if he is being "shipped off" to another nursing facility. Overall, he seems to be experiencing severe depressive thoughts and is in a dark place mentally.  Objective:  Vital signs in last 24 hours: Vitals:   07/01/20 0940 07/01/20 1202 07/01/20 2030 07/02/20 0400  BP:  (!) 133/54 129/63 (!) 124/55  Pulse:  89 84 81  Resp:  18 18 18   Temp:  98.3 F (36.8 C) 98.8 F (37.1 C) 98.1 F (36.7 C)  TempSrc:  Oral Oral   SpO2: 97% 95% 97% 98%  Weight:    104.7 kg  Height:        Weight change:   Intake/Output Summary (Last 24 hours) at 07/02/2020 1020 Last data filed at 07/02/2020 0046 Gross per 24 hour  Intake 1320 ml  Output 500 ml  Net 820 ml   Physical Exam Constitutional:      Comments: Sitting up in bed eating breakfast, slightly leaned to the right. He is alert, conversant, and in NAD.  Cardiovascular:     Rate and Rhythm: Normal rate.     Comments: Heart sounds difficult to appreciate due to body habitus. Pulmonary:     Effort: Pulmonary effort is normal.     Comments: Persistent rhonchi in all lung fields.  Abdominal:     General: Abdomen is protuberant. Bowel sounds are decreased.     Tenderness: There is generalized abdominal tenderness.     Comments: Abdomen is soft yet tense. Tympanic to percussion midline. Dullness to percussion noted on L and R sides of abdomen.  Musculoskeletal:     Right lower leg: No edema.     Left lower leg: No edema.     Comments: Pain of L trapezius localized over scapula.  Feet:     Right foot:     Toenail Condition: Right toenails are abnormally thick and long.     Left foot:     Toenail Condition: Left toenails are abnormally thick and long.  Skin:  General: Skin is warm.     Comments: Midline sacral wound with deep red granulation tissue surrounded by border of erythema. White, devitalized tissue is present along inferior border.     Assessment/Plan:  Active Problems:   Acute respiratory failure ([place])  Patient Summary: Kenneth Seda Burlesonis a 70 y.o.malewith a PMH of CVA, asthma, CAD, CHF, COPD, DVT who presentson hospital day 6 for evaluation of acute respiratory distress secondary to lack of oxygen in the setting of COPD.  1. Acute respiratory failure Respiratory failure remains resolved. Patient continues to be stable on 2L nasal cannula with SpO2 93%. Mucinex, flutter device, and suction continue to manage his sputum. Rhonchi noted on exam again today in all lung fields. CXR yesterday showed  improvement of R lung opacities and is consistent with chronic COPD. -Continue oxygen supplementation with 2L nasal cannula -Continue flutter device, Mucinex 1200 mg daily, suction for sputum control -Continue dulera 2 puffs BID  2. UTI Patient finished keflex yesterday. He endorses burning with urination again today and states that the pain began before his last catheter was changed that caused local tissue trauma. It is possible that organism causing UTI could not have been eradicated with previous course of cefepime. Urine color is light yellow again today. -Urine culture -Monitor urinary symptoms  3. Ileus with constipation Patient has diffuse pain to palpation of protuberant abdomen. He states he has had only one small, mucus-like bowel movement since receiving a bisacodyl suppository. However, the nurse caring for him stated he had a "large, liquid" bowel movement this morning. He continues to pass gas. Bowel sounds remain hypoactive today. KUB yesterday demonstrated ileus with constipation. -Water enema to continue to reduce stool burden -Senna two tablets twice daily -Will monitor bowel movements  4. Decubitus ulcer on midline sacrum Patient continues to report pain with sacral wound. Wound does not have any discolored drainage. Red granulation tissue is present with some possible devitalized tissue at the inferior border. -Tylenol 1000 mg TID for pain -Due to persistent pain, will consider Norco once bowel movements become more regular -Continue wound care  5. Post-stroke left shoulder pain Patient continues to have pain overlying his left trapezius. The Tylenol or Lyrica have not helped the pain.  -Lidocaine 5% patch over area for pain -Prednisolone 10 mg once, assess pain reduction -Continue to monitor symptoms  6. Depression Patient has experienced many changes in his mobility and overall quality of life since having his CVA and having such profound weakness in his LUE  and LLE that has left him bed-bound. During my conversation with him today, he reported a history of multiple attempts to die by suicide, one of which required him to have a tracheotomy. He reports currently thinking about dying by suicide due to his ongoing, unrelenting pain. He has never seen a therapist or psychiatrist. I am concerned about his thoughts of suicide. I also believe he would benefit from speaking with a mental health professional about life changes, as mental health closely intertwines with physical health. He currently takes duloxetine 40 mg daily. -Consult psychiatry for further evaluation -Increase duloxetine 60 mg   LOS: 6 days   Mikal Plane, Medical Student 07/02/2020, 10:20 AM Pager: [phone] After 5pm on weekdays and 1pm on weekends: On Call pager [phone]

## 2020-07-02 NOTE — TOC Progression Note (Addendum)
Transition of Care [facility]) - Progression Note    Patient Details  Name: [name] MRN: [id number] Date of Birth: 1950/11/19  Transition of Care [facility]) CM/SW [place], [place] Phone Number: 07/02/2020, 2:38 PM  Clinical Narrative:    2:38pm-CSW received request from [facility] for a peer to peer as their medical director will likely deny rehab. CSW made Md aware.   CSW received call from [facility] that patient will need to pay $387 for his monthly liability. CSW spoke with patient's caregiver, [name], to explain cost. She stated that patient has already used this month's check and his check does not come until June 1st. CSW staffing case with [facility] Supervisor as patient may be medically stable to discharge tomorrow.   3:15pm-CSW received approval from [facility] to provide a 6 day Letter of Guarantee as long as SNF does not bill Medicaid for month of May. [facility] finding out if they can accept that.    Expected Discharge Plan: [place] Barriers to Discharge: Continued Medical Work up,Insurance Authorization,SNF Pending bed offer  Expected Discharge Plan and Services Expected Discharge Plan: [place] In-house Referral: Clinical Social Work   Post Acute Care Choice: [place] Living arrangements for the past 2 months: [place]                                       Social Determinants of Health (SDOH) Interventions Housing Interventions: Intervention Not Indicated  Readmission Risk Interventions Readmission Risk Prevention Plan 08/23/2018  Transportation Screening Complete  PCP or Specialist Appt within 3-5 Days Complete  HRI or [place] Not Complete  Palliative Care Screening Complete  Medication Review (RN Care Manager) Referral to Pharmacy  Some recent data might be hidden

## 2020-07-02 NOTE — Progress Notes (Signed)
Physical Therapy Discharge Patient Details Name: [name] MRN: [id number] DOB: 1950/08/19 Today's Date: 07/02/2020 Time:  -     Patient discharged from PT services secondary to Reviewed therapy notes from prior encounters and feel pt is at his baseline as far as mobility. 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 He is currently at the same level. Will sign off. Agree with plan for long term care for pt. .  Please see latest therapy progress note for current level of functioning and progress toward goals.    [place] Pager [phone] Office [place] 07/02/2020, 1:29 PM

## 2020-07-03 DIAGNOSIS — K567 Ileus, unspecified: Secondary | ICD-10-CM

## 2020-07-03 DIAGNOSIS — R0603 Acute respiratory distress: Secondary | ICD-10-CM | POA: Diagnosis not present

## 2020-07-03 DIAGNOSIS — K5900 Constipation, unspecified: Secondary | ICD-10-CM | POA: Diagnosis not present

## 2020-07-03 DIAGNOSIS — R45851 Suicidal ideations: Secondary | ICD-10-CM

## 2020-07-03 DIAGNOSIS — M25512 Pain in left shoulder: Secondary | ICD-10-CM | POA: Diagnosis not present

## 2020-07-03 LAB — CBC
HCT: 33.2 % — ABNORMAL LOW (ref 39.0–52.0)
Hemoglobin: 10.1 g/dL — ABNORMAL LOW (ref 13.0–17.0)
MCH: 27 pg (ref 26.0–34.0)
MCHC: 30.4 g/dL (ref 30.0–36.0)
MCV: 88.8 fL (ref 80.0–100.0)
Platelets: 199 10*3/uL (ref 150–400)
RBC: 3.74 MIL/uL — ABNORMAL LOW (ref 4.22–5.81)
RDW: 14.5 % (ref 11.5–15.5)
WBC: 6.7 10*3/uL (ref 4.0–10.5)
nRBC: 0 % (ref 0.0–0.2)

## 2020-07-03 MED ORDER — MELATONIN 5 MG PO TABS
5.0000 mg | ORAL_TABLET | Freq: Every day | ORAL | Status: DC
  Administered 2020-07-03: 5 mg via ORAL
  Filled 2020-07-03 (×2): qty 1

## 2020-07-03 MED ORDER — ZINC OXIDE 12.8 % EX OINT
TOPICAL_OINTMENT | CUTANEOUS | Status: DC | PRN
  Filled 2020-07-03: qty 56.7

## 2020-07-03 MED ORDER — METHOCARBAMOL 750 MG PO TABS
750.0000 mg | ORAL_TABLET | Freq: Four times a day (QID) | ORAL | Status: DC
  Administered 2020-07-03 – 2020-07-04 (×5): 750 mg via ORAL
  Filled 2020-07-03 (×4): qty 1

## 2020-07-03 NOTE — Progress Notes (Addendum)
Subjective:  [name] did not sleep well last night because he kept waking up every so often. He is breathing the same today and sputum production is controlled. He has experienced very little improvement in his left shoulder pain after starting the lidocaine patch and a one-dose trial of low-dose prednisolone. His abdomen is still tense today. He had a large bowel movement this morning, but he is unsure of the quality. His nurse states the bowel movement was brown and "mushy." He does not feel when he is about to have a bowel movement. He continues to have a good appetite, and he is drinking fluids. He does endorse SOB when he is eating and talking. He still has dysuria, but he is unsure of if the pain is all the time or only when the urine comes into contact with the skin of his penis. He does endorse new pain on both of his inner heels today, as well. He is feeling the same mentally as he did yesterday.   Objective:  Vital signs in last 24 hours: Vitals:   07/02/20 0400 07/02/20 1202 07/02/20 2016 07/03/20 0453  BP: (!) 124/55 (!) 126/56 (!) 131/57 129/60  Pulse: 81 84 82 81  Resp: 18 18 18 18   Temp: 98.1 F (36.7 C) 98.3 F (36.8 C) 99.1 F (37.3 C) 98.5 F (36.9 C)  TempSrc:  Oral Oral Oral  SpO2: 98%   93%  Weight: 104.7 kg     Height:       Weight change:   Intake/Output Summary (Last 24 hours) at 07/03/2020 1046 Last data filed at 07/03/2020 0911 Gross per 24 hour  Intake 950 ml  Output 1400 ml  Net -450 ml   Physical Exam Constitutional:      Comments: Laying back in bed, watching television. He is alert and conversant. His voice sounds less congested today.  Cardiovascular:     Rate and Rhythm: Normal rate.     Comments: Heart sounds are difficult to appreciate due to body habitus. Pulmonary:     Comments: SOB experienced while talking today. Otherwise normal breathing effort at rest. Slightly improved rhonchi appreciated in all anterior lung fields. Due to  generalized pain and difficulty mobilizing, posterior lung fields not examined. Abdominal:     General: Abdomen is protuberant. Bowel sounds are decreased.     Comments: Abdomen is softer today and less tense. Dullness to percussion noted in R side of abdomen and LLQ. Tympanic to percussion elsewhere.  Musculoskeletal:     Right lower leg: No edema.     Left lower leg: No edema.     Comments: Lidocaine patch over L scapula. Localized pain to palpation of area.  Feet:     Right foot:     Toenail Condition: Right toenails are abnormally thick and long.     Left foot:     Toenail Condition: Left toenails are abnormally thick and long.     Comments: Feet displaced in Prevalon boots, approximately 3 cm unroofed blister on medial calcaneous bilaterally. Neurological:     Mental Status: He is oriented to person, place, and time.  Psychiatric:     Comments: He is joking and laughing today but becomes more serious when discussing his pain burden.    Assessment/Plan:  Active Problems:   Acute respiratory failure [facility])  Patient Summary: Kenneth Treptow Burlesonis a 70 y.o.malewith a PMH of CVA, asthma, CAD, CHF, COPD, DVT who presentson hospital day33for evaluation of acute respiratory distress secondary  to lack of oxygen in the setting of COPD.  1. Ileus with constipation Patient reports having a large bowel movement this morning. His nurse described the stool as brown and "mushy." He continues to have incontinence. He did not receive the water enema yesterday. His abdomen continues to be protuberant, but it is more soft and less tense. He states it still feels tight. -Water enema to continue reducing stool burden -Continue senna two tablets twice daily -Will monitor  2. Post-stroke left shoulder pain Patient's pain has only mildly reduced with lidocaine patch and trial of low-dose prednisolone. He has been on Norco 325 mg as recently as January of 2022, but he has not been taking this during  this admission. Other measures are being trialed for his pain due to the potential deleterious effects of opioids in Mr. Trompeter. Potential respiratory depression in the setting of his COPD and constipation in the setting of his ileus are relative, but not absolute, contraindications. Will reassess use of opioids for pain control if other measures continue to fail. -Restart home dose of Robaxin 750 mg Q6H for pain -Reassess pain control  3. Pressure wounds Patient continues to have pain over his sacral wound. He has been alternating to the left and right in the bed every two hours, and this has helped his pain somewhat. The wound does demonstrate exuberant granulation tissue, and it will require either chemical or mechanical debridement to continue healing. He also reports feeling "raw" around his bottom both from the wound and while being cleaned up after bowel movements, and he would benefit from a barrier cream. He has two new blisters on the medial calcaneous bilaterally due to displaced prevalon boots that are also painful for him. -Home dose Robaxin 750 mg Q6H for pain -Wound care nurse consulted, will apply silver nitrate to rolled borders and undergo mechanical debridement of hypergranulation tissue tomorrow to enhance epithelialization -Zinc oxide 12.8% prn -Heel protectors  4. Depression Patient feels the same mentally today, stating he is just in so much pain. He continues to take his duloxetine 60 mg daily dose. Psychiatry has consulted and recommends controlling pain as much as possible. He also has not been able to sleep well, likely secondary to depression and his pain. As mentioned above, measures other than opioids are being trialed due to the potential for worsening respiratory drive in the setting of his COPD as well as worsening constipation in the setting of his ileus.  -Continue duloxetine 60 mg -Melatonin 5 mg at bedtime -Will trial medications for control of severe pain  5.  Potential UTI Patient continues to have discomfort with urination. He states that he is unsure if the pain is all the time or only when urine comes into contact with his penis. Urine culture has been collected, will reassess management based on results.  -Await urine culture results  6. Acute respiratory failure Remains resolved. Stable on 2L nasal cannula with SpO2 93%, RR 18. He feels some SOB when speaking and eating. Sputum is managed with current therapy. Rhonchi noted again in anterior lung fields, slightly improved.  -Continue oxygen 2L nasal cannula -Continue flutter device, Mucinex 1200 mg daily, suction for sputum control -Continue dulera 2 puffs BID    LOS: 7 days   Mikal Plane, Medical Student 07/03/2020, 10:46 AM Pager: [phone] After 5pm on weekdays and 1pm on weekends: On Call pager [phone]

## 2020-07-03 NOTE — Consult Note (Signed)
Reason for Consult: Passive SI with history of attempts   I personally spent 20 minutes on the unit in direct patient care. The direct patient care time included face-to-face time with the patient, reviewing the patient's chart, communicating with other professionals, and coordinating care. Greater than 50% of this time was spent in counseling or coordinating care with the patient regarding goals of hospitalization, psycho-education, and discharge planning needs.     HPI: [name] is a 70 yr old male admitted for dyspnea. PMH significant for CVA, asthma, CAD, CHF, COPD, DVT and suicide attempts.   He reports that he has been bed bound for about 3-4 years. He reports that he is just so tired of hurting that he wants it to stop. Discussed that the Duloxetine he is on will help with both his pain and his SI. He reports currently no SI, HI, or AVH.    Plan: Case discussed with Dr. Dwyane Dee  His Duloxetine was increased to 60 mg yesterday. As his symptoms are driven by his pain his pain control will be most important. Currently he will be going to SNF for further care and rehab. Recommend maximizing pain control and continuing with Duloxetine 60 mg for now. If needed this can be further increase to 90 mg in one week for further symptoms control.  -Continue Duloxetine 60 mg QHS -Can increase to 90 mg in 1 week if further improvement needed. -Maximize pain control as able.   Thank you very much for allowing me to take part in this patients care. At this time Psychiatry will sign off. Please reach out if further questions arise.  Fatima Sanger MD Resident

## 2020-07-03 NOTE — TOC Progression Note (Addendum)
Transition of Care [facility]) - Progression Note    Patient Details  Name: [name] MRN: [id number] Date of Birth: Jun 04, 1950  Transition of Care [facility]) CM/SW [place], [place] Phone Number: 07/03/2020, 9:50 AM  Clinical Narrative:    [facility] able to accept LOG from the hospital for the rest of May at the [facility] rate and SNF will not bill Medicaid for the month of May. LOG submitted to Administration. CSW updated [name] and will continue to follow for discharge date.    Expected Discharge Plan: [place] Barriers to Discharge: Continued Medical Work up,Insurance Authorization,SNF Pending bed offer  Expected Discharge Plan and Services Expected Discharge Plan: [place] In-house Referral: Clinical Social Work   Post Acute Care Choice: [place] Living arrangements for the past 2 months: [place]                                       Social Determinants of Health (SDOH) Interventions Housing Interventions: Intervention Not Indicated  Readmission Risk Interventions Readmission Risk Prevention Plan 08/23/2018  Transportation Screening Complete  PCP or Specialist Appt within 3-5 Days Complete  HRI or [place] Not Complete  Palliative Care Screening Complete  Medication Review (RN Care Manager) Referral to Pharmacy  Some recent data might be hidden

## 2020-07-03 NOTE — Progress Notes (Signed)
Occupational Therapy Treatment Patient Details Name: [name] MRN: [id number] DOB: [birth year] Today's Date: 07/03/2020    History of present illness 70 yo male presenting 5/18 to ED with reports of increasing dyspnea.  SaO2 on RA in 70% in field, Placed on CPAP and improved to 96%O2 pt transitioned to 5L via  in ED with SAO2 98-99%O2 Admitted for treatment of Acute repiratory failure and UTI  PMH  CVA (with L-sided weakness), alcohol abuse, prior tobacco and cocaine use, CAD, CHF, COPD, DM II, DVT, OSA on CPAP, PVD, sacral wound with osteomylitis and seizures.   OT comments  Min assist using features of bed to pull pt up prior to placing HOB upright for self feeding. Pt able to eat with spoon once tray set up. PROM performed to L UE and positioned with pillows with elbow extended. Pt could benefit from R resting hand splint, pt stated he had never had one and was not sure he would like it, will think about it.   Follow Up Recommendations  SNF;Supervision/Assistance - 24 hour    Equipment Recommendations  None recommended by OT    Recommendations for Other Services      Precautions / Restrictions Precautions Precautions: Fall       Mobility Bed Mobility Overal bed mobility: Needs Assistance             General bed mobility comments: pulled up in bed in trendelenberg    Transfers                 General transfer comment: hoyer lift at baseline    Balance                                           ADL either performed or assessed with clinical judgement   ADL Overall ADL's : Needs assistance/impaired Eating/Feeding: Minimal assistance;Bed level Eating/Feeding Details (indicate cue type and reason): assist to open containers, pt self feeds with a spoon Grooming: Wash/dry hands;Bed level;Maximal assistance Grooming Details (indicate cue type and reason): washed and dried R hand prior to eating                                      Vision       Perception     Praxis      Cognition Arousal/Alertness: Awake/alert Behavior During Therapy: WFL for tasks assessed/performed Overall Cognitive Status: History of cognitive impairments - at baseline                                 General Comments: pt with baseline cognitive impairment, pleasant, making needs known and following commands without difficulty        Exercises     Shoulder Instructions       General Comments      Pertinent Vitals/ Pain       Pain Assessment: No/denies pain  Home Living                                          Prior Functioning/Environment              Frequency  Min 2X/week        Progress Toward Goals  OT Goals(current goals can now be found in the care plan section)  Progress towards OT goals: Progressing toward goals  Acute Rehab OT Goals Patient Stated Goal: To get to recliner. OT Goal Formulation: With patient Time For Goal Achievement: 07/14/20 Potential to Achieve Goals: [place] Discharge plan remains appropriate    Co-evaluation                 AM-PAC OT "6 Clicks" Daily Activity     Outcome Measure   Help from another person eating meals?: A Little Help from another person taking care of personal grooming?: A Lot Help from another person toileting, which includes using toliet, bedpan, or urinal?: Total Help from another person bathing (including washing, rinsing, drying)?: Total Help from another person to put on and taking off regular upper body clothing?: Total Help from another person to put on and taking off regular lower body clothing?: Total 6 Click Score: 9    End of Session    OT Visit Diagnosis: Muscle weakness (generalized) (M62.81);Hemiplegia and hemiparesis;Pain Hemiplegia - Right/Left: Left Hemiplegia - dominant/non-dominant: Non-Dominant Hemiplegia - caused by: Cerebral infarction   Activity Tolerance Patient tolerated  treatment well   Patient Left in bed;with call bell/phone within reach;with bed alarm set   Nurse Communication          Time: 1210-1230 OT Time Calculation (min): 20 min  Charges: OT General Charges $OT Visit: 1 Visit OT Treatments $Self Care/Home Management : 8-22 mins  Nestor Lewandowsky, OTR/L Acute Rehabilitation Services Pager: [phone] Office: [phone]   Malka So 07/03/2020, 1:10 PM

## 2020-07-04 ENCOUNTER — Inpatient Hospital Stay (HOSPITAL_COMMUNITY): Payer: Medicare Other

## 2020-07-04 ENCOUNTER — Encounter (HOSPITAL_COMMUNITY): Payer: Self-pay | Admitting: Internal Medicine

## 2020-07-04 DIAGNOSIS — F32A Depression, unspecified: Secondary | ICD-10-CM | POA: Diagnosis not present

## 2020-07-04 DIAGNOSIS — K567 Ileus, unspecified: Secondary | ICD-10-CM | POA: Diagnosis not present

## 2020-07-04 DIAGNOSIS — K5900 Constipation, unspecified: Secondary | ICD-10-CM | POA: Diagnosis not present

## 2020-07-04 DIAGNOSIS — M25512 Pain in left shoulder: Secondary | ICD-10-CM | POA: Diagnosis not present

## 2020-07-04 LAB — CBC
HCT: 33.8 % — ABNORMAL LOW (ref 39.0–52.0)
Hemoglobin: 10.2 g/dL — ABNORMAL LOW (ref 13.0–17.0)
MCH: 26.8 pg (ref 26.0–34.0)
MCHC: 30.2 g/dL (ref 30.0–36.0)
MCV: 88.9 fL (ref 80.0–100.0)
Platelets: 199 10*3/uL (ref 150–400)
RBC: 3.8 MIL/uL — ABNORMAL LOW (ref 4.22–5.81)
RDW: 14.6 % (ref 11.5–15.5)
WBC: 5.3 10*3/uL (ref 4.0–10.5)
nRBC: 0 % (ref 0.0–0.2)

## 2020-07-04 LAB — URINE CULTURE: Culture: NO GROWTH

## 2020-07-04 MED ORDER — MELATONIN 5 MG PO TABS
10.0000 mg | ORAL_TABLET | Freq: Every day | ORAL | Status: DC
  Filled 2020-07-04: qty 2

## 2020-07-04 MED ORDER — SILVER NITRATE-POT NITRATE 75-25 % EX MISC
1.0000 "application " | Freq: Once | CUTANEOUS | Status: AC
  Administered 2020-07-04: 1 via TOPICAL
  Filled 2020-07-04: qty 1

## 2020-07-04 MED ORDER — LINACLOTIDE 72 MCG PO CAPS
70.0000 ug | ORAL_CAPSULE | Freq: Every day | ORAL | Status: DC
  Administered 2020-07-04 – 2020-07-05 (×2): 72 ug via ORAL
  Filled 2020-07-04 (×3): qty 1

## 2020-07-04 MED ORDER — ZOLPIDEM TARTRATE 5 MG PO TABS
5.0000 mg | ORAL_TABLET | Freq: Every evening | ORAL | Status: DC | PRN
  Administered 2020-07-06 – 2020-07-08 (×3): 5 mg via ORAL
  Filled 2020-07-04 (×3): qty 1

## 2020-07-04 NOTE — Progress Notes (Signed)
Nutrition Follow-up  DOCUMENTATION CODES:   Obesity unspecified  INTERVENTION:   -Continue double protein portions at meals -Continue MVI with minerals daily -Continue lactaid milk with meals -Continue 1 packet Juven BID, each packet provides 95 calories, 2.5 grams of protein (collagen), and 9.8 grams of carbohydrate (3 grams sugar); also contains 7 grams of L-arginine and L-glutamine, 300 mg vitamin C, 15 mg vitamin E, 1.2 mcg vitamin B-12, 9.5 mg zinc, 200 mg calcium, and 1.5 g  Calcium Beta-hydroxy-Beta-methylbutyrate to support wound healing -Continue dysphagia 3 diet (advanced mechanical soft) for ease of intake  NUTRITION DIAGNOSIS:   Increased nutrient needs related to wound healing as evidenced by estimated needs.  Ongoing  GOAL:   Patient will meet greater than or equal to 90% of their needs  Progressing   MONITOR:   PO intake,Supplement acceptance,Labs,Weight trends,Skin,I & O's  REASON FOR ASSESSMENT:   Low [place]    ASSESSMENT:   [name] is a 70 y.o. male with a PMH of CVA, asthma, CAD, CHF, COPD, DVT who presents for evaluation of dyspnea.  Reviewed I/O's: -577 ml x 24 hours and -5.9 L since admission  UOP: 2.5 L x 24 hours  Pt sleeping soundly at time of visit. RD did not disturb.  Pt remains with good appetite. Noted meal completion 100%. Pt also compliant with Juven supplements.   Per TOC notes, plan to d/c to SNF once medically stable.   Labs reviewed.    Diet Order:   Diet Order            DIET DYS 3 Room service appropriate? No; Fluid consistency: Thin  Diet effective now                 EDUCATION NEEDS:   Education needs have been addressed  Skin:  Skin Assessment: Skin Integrity Issues: Skin Integrity Issues:: Stage IV Stage IV: sacrum  Last BM:  07/03/20  Height:   Ht Readings from Last 1 Encounters:  06/26/20 6' (1.829 m)    Weight:   Wt Readings from Last 1 Encounters:  07/04/20 103.7 kg    Ideal Body  Weight:  80.9 kg  BMI:  Body mass index is 31.01 kg/m.  Estimated Nutritional Needs:   Kcal:  2400-2600  Protein:  120-135 grams  Fluid:  >2 L    Loistine Chance, RD, LDN, [place] Registered Dietitian II Certified Diabetes Care and Education Specialist Please refer to [facility] for RD and/or RD on-call/weekend/after hours pager

## 2020-07-04 NOTE — Progress Notes (Addendum)
Subjective:  [name] is feeling the same today both physically and mentally. He states that the Robaxin has not helped his shoulder or wound pain. The water enema did not help his constipation much, as his bowel movement was "just water." The melatonin did not help him sleep better, and he woke up at 4 AM in pain. He continues to have pain with urination. He has stable breathing with supplemental oxygen and sputum control.  Objective:  Vital signs in last 24 hours: Vitals:   07/03/20 1355 07/03/20 2018 07/03/20 2028 07/04/20 0407  BP: 125/61 122/63  121/66  Pulse: 84 80  78  Resp: 20 18  18   Temp: 98.3 F (36.8 C) 99 F (37.2 C)  98.5 F (36.9 C)  TempSrc: Oral Oral  Oral  SpO2: 96% 97% 98% 96%  Weight:    103.7 kg  Height:       Weight change:   Intake/Output Summary (Last 24 hours) at 07/04/2020 1050 Last data filed at 07/04/2020 7793 Gross per 24 hour  Intake 1813 ml  Output 2100 ml  Net -287 ml   Physical Exam Constitutional:      Comments: Patient positioned slightly inferior in the bed, and he takes breaths after speaking each word. He is alert, conversant, and in NAD.  Cardiovascular:     Rate and Rhythm: Normal rate.     Comments: Heart sounds difficult to appreciate due to body habitus. There is no lower extremity edema. Pulmonary:     Comments: Patient has improved breathing effort after help with repositioning more superiorly in bed. Rhonchi are noted in anterior lung fields bilaterally. Abdominal:     General: Abdomen is protuberant. Bowel sounds are absent.     Tenderness: There is generalized abdominal tenderness.     Comments: Abdomen is soft and mildly tense.  Genitourinary:    Comments: Penis is circumcised. Glans looks macerated and urine in contact with glans in condom catheter. Once catheter removed, purple discoloration noted around edge of glans. Musculoskeletal:     Comments: Pain to palpation of L scapula and trapezius.  Feet:     Right  foot:     Toenail Condition: Right toenails are abnormally thick and long.     Left foot:     Toenail Condition: Left toenails are abnormally thick and long.     Comments: Blister on L medial calcaneous markedly improved. Heel dressing still intact over blister on R medial calcaneous. Skin:    Comments: Sacral wound demonstrates deep red granulation tissue in center. Epibole present along edges. Hyperpigmentation and scarring noted along lateral sides of ulcer demonstrating previously healed ulcer. White discoloration of ulcer edge after application of silver nitrate.  Psychiatric:        Attention and Perception: Attention normal.        Speech: Speech normal.        Thought Content: Thought content normal.    Assessment/Plan:  Active Problems:   Acute respiratory failure ([place])  Patient Summary: Kenneth Winski Burlesonis a 70 y.o.malewith a PMH of CVA, asthma, CAD, CHF, COPD, DVT who presentson hospital day8after admission for acute respiratory distress secondary to lack of oxygen in the setting of COPD.  1. Ileus with constipation Patient had a bowel movement after water enema, but it was only water. His abdomen is still distended and painful, but it has gradually become more soft and less tense. Given continued stool burden and protuberance of abdomen, obtained repeat KUB which showed continued  large amount of stool in colon and rectum and moderate colonic distention. It also showed mild gastric distention and no small bowel distention, both of which are improved from previous KUB. Will try Linzess today to try and relieve constipation. Due to history of GI bleed with recent episode on 04/12/2020, will also consult GI for assessment of current constipation that is refractory to multiple treatments. Will also monitor CBC for changes in Hgb, although it has been stable around 10 during his stay here. -Linzess 72 mcg daily before breakfast -Continue senna two tablets twice daily -GI will see  in the morning -CBC  2. Post-stroke left shoulder pain Patient's pain continues with minimal relief after Robaxin, one-dose methylprednisolone, lidocaine patches, and increasing duloxetine. Due to refractory pain to multiple treatments, will order x-ray of shoulder to assess for potential avascular necrosis or persistent glenohumeral subluxation due to his history of the condition. Also considering adhesive capsulitis in setting of immobilized shoulder and noticeable contracture of LUE. His pain is likely a mixture of MSK and neurological pathology. Will consider neurorehabilitation depending on x-ray results to assess other treatment options such as transcutaneous electrical nerve stimulation. -X-ray of shoulder -Tylenol 1000 mg prn -Discontinue Robaxin 750 mg  3. Pressure wounds Patient's sacral wound pain is unchanged after pain relieving measures. Silver nitrate was applied to the rolled borders today with little pain to the patient in an attempt to spark improved epithelialization over granulation tissue. The blisters on his medial calcaneous bilaterally are markedly improved after heel protectors. There was some stool around wound upon examination today. -Continue zinc oxide 12.8% barrier cream to reduce contamination of wound as much as possible -Continue heel protectors -Wound care  4. Depression Patient's mood is unchanged today, as his pain continues at the same intensity. I believe his feelings stem from the pain he is experiencing, and he believes he would feel much better once the pain is better controlled. He did not sleep any better last night with the 5 mg melatonin and his normal dose of trazodone. Given history of refractory insomnia, will need to try other options such as zolpidem. -Continue duloxetine 60 mg -Discontinue melatonin, trazodone -Begin zolpidem 5 mg -Continue trials to control pain  5. Potential UTI Patient's discomfort with urination is unchanged. Urine  culture shows no growth. Exam of penis shows maceration and some erythema from condom catheter fitting poorly. Patient would benefit from switching to foley catheter, as he is unable to mobilize the left side of his body enough to successfully use a urinal; we must prevent soiling the bed so as to prevent further contamination of healing sacral wound; and he cannot use a condom catheter due to fluid being in contact with glans for too long causing maceration. I suspect that his pain on the shaft of his penis is due to the condom catheter fitting too tight and getting his pubic hair caught in the edges. His pain when urinating at the glans is also likely due to the macerated tissue after having constant contact with urine with the condom catheter. -Foley catheter to ensure effective and hygienic urination -Monitor symptoms  6. Acute respiratory failure Resolved. He is stable on 2L nasal cannula with SpO2 96%, RR 18. He feels more congested today, but upon repositioning, he felt better. He states that the flutter device, mucinex, and suction continues to help.  -Continue oxygen 2L nasal cannula -Flutter device, Mucinex 1200 mg daily, suction for sputum control -Dulera 2 puffs BID   LOS: 8  days   Mikal Plane, Medical Student 07/04/2020, 10:50 AM Pager: [phone] After 5pm on weekdays and 1pm on weekends: On Call pager [phone]

## 2020-07-04 NOTE — Care Management Important Message (Signed)
Important Message  Patient Details  Name: [name] MRN: [id number] Date of Birth: February 08, 1951   Medicare Important Message Given:  Yes - Important Message mailed due to current National Emergency  Verbal consent obtained due to current National Emergency  Relationship to patient: Self Contact Name: Albaro Call Date: 07/04/20  Time: 1204 Phone: [phone] Outcome: No Answer/Busy Important Message mailed to: Patient address on file    [name] 07/04/2020, 12:04 PM

## 2020-07-04 NOTE — Plan of Care (Signed)

## 2020-07-05 DIAGNOSIS — K5900 Constipation, unspecified: Secondary | ICD-10-CM | POA: Diagnosis not present

## 2020-07-05 DIAGNOSIS — R300 Dysuria: Secondary | ICD-10-CM

## 2020-07-05 DIAGNOSIS — K5909 Other constipation: Secondary | ICD-10-CM

## 2020-07-05 DIAGNOSIS — F32A Depression, unspecified: Secondary | ICD-10-CM | POA: Diagnosis not present

## 2020-07-05 DIAGNOSIS — M25512 Pain in left shoulder: Secondary | ICD-10-CM | POA: Diagnosis not present

## 2020-07-05 DIAGNOSIS — K567 Ileus, unspecified: Secondary | ICD-10-CM | POA: Diagnosis not present

## 2020-07-05 MED ORDER — POLYETHYLENE GLYCOL 3350 17 GM/SCOOP PO POWD
1.0000 | Freq: Once | ORAL | Status: AC
  Administered 2020-07-05: 255 g via ORAL
  Filled 2020-07-05 (×2): qty 255

## 2020-07-05 MED ORDER — POLYETHYLENE GLYCOL 3350 17 G PO PACK
17.0000 g | PACK | Freq: Two times a day (BID) | ORAL | Status: DC
  Administered 2020-07-06 (×2): 17 g via ORAL
  Filled 2020-07-05 (×5): qty 1

## 2020-07-05 MED ORDER — CHLORHEXIDINE GLUCONATE CLOTH 2 % EX PADS
6.0000 | MEDICATED_PAD | Freq: Every day | CUTANEOUS | Status: DC
  Administered 2020-07-05 – 2020-07-09 (×5): 6 via TOPICAL

## 2020-07-05 MED ORDER — IPRATROPIUM-ALBUTEROL 0.5-2.5 (3) MG/3ML IN SOLN: 3.0000 mL | RESPIRATORY_TRACT | Status: DC | PRN

## 2020-07-05 NOTE — Progress Notes (Signed)
Subjective:  [name] is not feeling very well today. He continues to not sleep very well. He states that he is woken up too often by staff. He continues to have shoulder pain, abdominal pain, sacral pain, and heel pain. He had a bowel movement this morning of unknown quality but still feels bloated and constipated. He continues to have a good appetite.  Objective:  Vital signs in last 24 hours: Vitals:   07/05/20 0300 07/05/20 0411 07/05/20 0516 07/05/20 1503  BP:  (!) 122/56  114/61  Pulse: 77 80  80  Resp: 16 16  18   Temp: 98.3 F (36.8 C) 98.3 F (36.8 C)  98.6 F (37 C)  TempSrc: Axillary Axillary  Axillary  SpO2:  98%  100%  Weight:   103.1 kg   Height:       Weight change: -0.6 kg  Intake/Output Summary (Last 24 hours) at 07/05/2020 1828 Last data filed at 07/05/2020 1722 Gross per 24 hour  Intake 960 ml  Output 3050 ml  Net -2090 ml   PHYSICAL EXAM  GEN: Well developed, well-nourished, in NAD, sitting up in bed slightly turned to the right side. He is alert and conversant. CVS: Normal rate. Heart sounds difficult to appreciate due to body habitus. No lower extremity edema. RESP: Breathing comfortably on 2 L [place]. Rhonchi noted in all lung fields. ABD: Soft, tense. Generalized tenderness to palpation. Dull to percussion on bilateral sides, tympanic across midline. SKIN: Mostly healed blisters of medial calcaneous with heel protectors overlying bilaterally. PSY: Mood is his normal. Discusses pain with a sense of humor today. Behavior and thought content normal.  Assessment/Plan:  Active Problems:   Acute respiratory failure ([place])  Patient Summary: [name] a 70 y.o.malewith a PMH of CVA, asthma, CAD, CHF, COPD, DVT who presentson hospital day8after admission for acute respiratory distress secondary to lack of oxygen in the setting of COPD.  1. Ileus with constipation Patient's bowel movement today was of unknown quality and did not offer  relief. Abdomen continues to be soft yet tense to palpation with dullness over lateral sides, indicating stool burden as evidenced on recent KUB. GI consulted and recommended miralax and gatorade. CBC was notable for stable Hgb of 10.2. -Miralax 17 g and gatorade BID -Linzess 72 mcg daily before breakfast -Continue senna two tablets twice daily -Monitor constipation  2. Post-stroke left shoulder pain Patient continues to experience debilitating pain in his shoulder despite multiple interventions. Orthopedic surgery consulted and ordered MRI c-spine and shoulder to assess further etiology of pain. Will continue pain controlling modalities. -Tylenol 1000 mg prn for pain -Lidocaine 5% patch BID -Await orthopedic recommendations  3. Pressure wounds Patient's sacral wound pain is unchanged after pain relieving measures. Attending and intern examined today and ordered chlorhexidine gluconate cloth 2%. Will examine tomorrow and assess need for further silver nitrate around epibole. -Chlorhexidine gluconate cloth 2% for wound antiseptic -Continue zinc oxide 12.8% barrier cream and heel protectors -Wound care  4. Depression [name] sleep and mood remain largely unchanged. His chronic pain, coupled with extrinsic factors in the hospital such as vital checking, continue to disrupt sleep. We will need to try and reduce distractions and interruptions to sleep as much as possible to allow interventions to work to their maximum ability. However, we would like to change these checks after we see any changes in SpO2 after weaning off O2 as described below. -Continue duloxetine 60 mg -Continue zolpidem 5 mg -Change vitals checks to  Q nursing shift after SpO2 remains in target range   5. Dysuria Attending and intern examined patient today for progress on [name] dysuria. Orders were placed for foley catheter. -Monitor symptoms  6. Resolved acute respiratory failure Patient remains stable  on 2L nasal cannula with SpO2 100%, RR 18. Given his continued stability and optimal SpO2 range of 88-92%, will wean O2 to 1L [place]. His sputum production remains the same, however, despite continued use of flutter device, mucinex, and suction. Given his history of non-small cell lung cancer s/p stereotactic body radiation therapy, concerned for potential recurrence. He was lost to follow up with his oncologist Dr. Earlie Server for CT w contrast for restaging. He did receive CT wo contrast in January 2022, however. Would like to continue with CT w contrast but given current national shortage, will likely have to postpone study until outpatient follow up when study is possible. -Oxygen 1L nasal cannula -Continue flutter device, Mucinex 1200 mg daily, suction for symptomatic control -Dulera 2 puffs BID   LOS: 9 days   Mikal Plane, Medical Student 07/05/2020, 6:28 PM Pager: [phone] After 5pm on weekdays and 1pm on weekends: On Call pager [phone]

## 2020-07-05 NOTE — Consult Note (Signed)
Reason for Consult:Left shoulder pain Referring Physician: [name] Time called: [id number] Time at bedside: [name] is an 70 y.o. male.  HPI: [name] was admitted 9d ago with a COPD exacerbation. During this admission he has been struggling with left shoulder pain. He notes that he has had pain in the shoulder for 2 years since a CVA but it's mostly been manageable. Since admission the pain has increased dramatically and he can't get much relief. Movement increases the pain which he describes as constant and burning but also like there's a knot. He localizes most of his pain to the deltoid area and posterior.  Past Medical History:  Diagnosis Date  . Acute blood loss anemia   . Acute CVA (cerebrovascular accident) ([place]) 08/27/2014  . Acute encephalopathy 11/28/2017  . Acute ischemic stroke ([place])   . AKI (acute kidney injury) ([place])   . Alcohol abuse    H/O  . Alcohol dependence with withdrawal with complication ([place]) 4/85/4627  . Anxiety   . Arterial ischemic stroke, MCA (middle cerebral artery), right, acute ([place])   . Arthritis   . Asthma   . Binocular vision disorder with diplopia 01/03/2013  . CAD (coronary artery disease) 05/27/10   Cath: severe single vessell CAD left cx midportion obtuse marginal 2 to 3.  . Cerebral infarction due to embolism of right middle cerebral artery ([place])   . Cerebral infarction due to stenosis of right carotid artery ([place]) 11/01/2014  . Cerebral thrombosis with cerebral infarction 07/18/2016  . Cerebrovascular accident (CVA) due to stenosis of right carotid artery ([place])   . CHF (congestive heart failure) ([place])   . Closed blow-out fracture of floor of orbit ([place]) 01/19/2013  . COPD (chronic obstructive pulmonary disease) ([place])   . COPD (chronic obstructive pulmonary disease) ([place])   . CVA (cerebral vascular accident) ([place]) 08/26/2014  . Dementia due to another medical condition ([place]) 07/12/2015  . Depression   . Diabetes mellitus, type 2 ([place])  03/13/2015  . Diastolic CHF, acute on chronic (HCC) 11/01/2014  . DOE (dyspnea on exertion) 11/23/2014  . DVT (deep venous thrombosis) ([place]) 02/22/2014  . Embolic stroke ([place])   . Enophthalmos due to trauma 01/19/2013  . Eyelid lesion 01/24/2013  . Fracture of inferior orbital wall ([place]) 01/03/2013  . Fracture of orbital floor ([place]) 01/03/2013  . GERD (gastroesophageal reflux disease)   . H/O total knee replacement 10/24/2014  . HCAP (healthcare-associated pneumonia) 10/15/2015  . Headache around the eyes 08/27/2014  . History of DVT (deep vein thrombosis)   . History of DVT of lower extremity   . Hyperkalemia 10/24/2014  . Hyperlipemia   . Hyperlipidemia   . Hypertension   . Hypoalbuminemia due to protein-calorie malnutrition ([place])   . Hypotension   . Labile blood pressure   . Left shoulder pain 09/28/2017  . Lower extremity edema 02/21/2014  . Medial orbital wall fracture (HCC) 01/19/2013  . Myocardial infarction ([place]) 2000  . Orbital fracture ([place]) 12/2012  . OSA on CPAP   . Pain of left eye 01/03/2013  . Peripheral vascular disease, unspecified ([place])    08/20/10 doppler: increase in right ABI post-op. Left ABI stable. S/P bi-fem bypass surgery  . Pneumonia 04/04/2012  . Right middle cerebral artery stroke ([place]) 01/18/2018  . Seizures ([place])   . Shortness of breath   . Stroke (cerebrum) ([place]) 02/06/2015  . Thrombocytopenia ([place])   . Tobacco abuse   . Trichiasis without entropion 04/16/2013   Overview:  LLL central   . Type 2 diabetes mellitus with peripheral neuropathy ([place]) 03/13/2015  . Urinary incontinence 03/14/2015    Past Surgical History:  Procedure Laterality Date  . abdoninal ao angio & bifem angio  05/27/10   Patent graft, occluded bil stents with no retrograde flow into the hypogastric arteries. 100% occl left ant. tibial artery, 70% to 80% to 100% stenosis right superficial fem artery above adductor canal. 100 % occl right ant. tibial vessell  . Aortogram w/ PTCA   09/292003  . BACK SURGERY    . BIOPSY  08/26/2018   Procedure: BIOPSY;  Surgeon: Lavena Bullion, DO;  Location: [facility];  Service: Gastroenterology;;  . CARDIAC CATHETERIZATION  05/27/10   severe CAD left cx  . CHOLECYSTECTOMY    . ESOPHAGOGASTRIC FUNDOPLICATION    . ESOPHAGOGASTRODUODENOSCOPY (EGD) WITH PROPOFOL N/A 08/26/2018   Procedure: ESOPHAGOGASTRODUODENOSCOPY (EGD) WITH PROPOFOL;  Surgeon: Lavena Bullion, DO;  Location: [facility];  Service: Gastroenterology;  Laterality: N/A;  . EYE SURGERY    . FEMORAL BYPASS  08/19/10   Right Fem-Pop  . FLEXIBLE SIGMOIDOSCOPY N/A 08/26/2018   Procedure: FLEXIBLE SIGMOIDOSCOPY;  Surgeon: Lavena Bullion, DO;  Location: [facility];  Service: Gastroenterology;  Laterality: N/A;  . HOT HEMOSTASIS N/A 08/26/2018   Procedure: HOT HEMOSTASIS (ARGON PLASMA COAGULATION/BICAP);  Surgeon: Lavena Bullion, DO;  Location[facility];  Service: Gastroenterology;  Laterality: N/A;  . IR ANGIO INTRA EXTRACRAN SEL COM CAROTID INNOMINATE BILAT MOD SED  07/20/2016  . IR ANGIO INTRA EXTRACRAN SEL COM CAROTID INNOMINATE BILAT MOD SED  01/14/2018  . IR ANGIO VERTEBRAL SEL SUBCLAVIAN INNOMINATE UNI R MOD SED  07/20/2016  . IR ANGIO VERTEBRAL SEL VERTEBRAL BILAT MOD SED  01/14/2018  . IR ANGIO VERTEBRAL SEL VERTEBRAL UNI L MOD SED  07/20/2016  . IR RADIOLOGIST EVAL & MGMT  05/27/2016  . IR US GUIDE VASC ACCESS RIGHT  01/14/2018  . JOINT REPLACEMENT    . PERIPHERAL VASCULAR CATHETERIZATION N/A 12/10/2014   Procedure: Abdominal Aortogram;  Surgeon: Angelia Mould, MD;  Location: [facility];  Service: Cardiovascular;  Laterality: N/A;  . POLYPECTOMY  08/26/2018   Procedure: POLYPECTOMY;  Surgeon: Lavena Bullion, DO;  Location: [facility];  Service: Gastroenterology;;  . RADIOLOGY WITH ANESTHESIA N/A 02/08/2015   Procedure: RADIOLOGY WITH ANESTHESIA;  Surgeon: Luanne Bras, MD;  Location: [facility];  Service: Radiology;  Laterality:  N/A;  . TEE WITHOUT CARDIOVERSION N/A 08/29/2014   Procedure: TRANSESOPHAGEAL ECHOCARDIOGRAM (TEE);  Surgeon: Josue Hector, MD;  Location: [facility] ENDOSCOPY;  Service: Cardiovascular;  Laterality: N/A;  . TOTAL KNEE ARTHROPLASTY      Family History  Problem Relation Age of Onset  . Heart attack Mother   . Heart failure Mother   . Cirrhosis Father   . Heart failure Brother   . Cancer Brother   . Hypertension Neg Hx        UNKNOWN  . Stroke Neg Hx        UNKNOWN    Social History:  reports that he has been smoking cigars and cigarettes. He started smoking about 59 years ago. He has a 25.00 pack-year smoking history. He has quit using smokeless tobacco.  His smokeless tobacco use included chew. He reports current alcohol use. He reports current drug use. Drug: Cocaine.  Allergies:  Allergies  Allergen Reactions  . Darvocet [Propoxyphene N-Acetaminophen] Hives  . Haldol [Haloperidol Decanoate] Hives  . Acetaminophen Nausea Only  Upset stomach, tolerates Hydrocodone/APAP if taken with food  . Metformin And Related Nausea And Vomiting  . Norco [Hydrocodone-Acetaminophen] Nausea And Vomiting    Tolerates if taken with food    Medications: I have reviewed the patient's current medications.  Results for orders placed or performed during the hospital encounter of 06/26/20 (from the past 48 hour(s))  CBC Once     Status: Abnormal   Collection Time: 07/04/20 10:57 AM  Result Value Ref Range   WBC 5.3 4.0 - 10.5 K/uL   RBC 3.80 (L) 4.22 - 5.81 MIL/uL   Hemoglobin 10.2 (L) 13.0 - 17.0 g/dL   HCT 33.8 (L) 39.0 - 52.0 %   MCV 88.9 80.0 - 100.0 fL   MCH 26.8 26.0 - 34.0 pg   MCHC 30.2 30.0 - 36.0 g/dL   RDW 14.6 11.5 - 15.5 %   Platelets 199 150 - 400 K/uL   nRBC 0.0 0.0 - 0.2 %    Comment: Performed at [facility], [place] [address] [place], [place] [address]    DG Abd 1 View  Result Date: 07/04/2020 CLINICAL DATA:  Abdominal distention. EXAM: ABDOMEN - 1 VIEW COMPARISON:   07/01/2020.  CT 05/29/2020. FINDINGS: Surgical clips noted in the upper abdomen and over both inguinal regions. Right iliac stent again noted. Aortoiliac atherosclerotic vascular calcification. Large amount of stool again noted in the colon and rectum suggesting constipation. Moderate colonic distention noted on today's exam. No small bowel distention. Mild gastric distention remains. No free air. Bibasilar atelectasis. Degenerative changes lumbar spine and both hips. IMPRESSION: Large amount of stool again noted throughout the colon and rectum. Constipation could present this fashion. There is associated moderate colonic distention noted on today's exam. Follow-up exam suggested to demonstrate resolution in order to exclude colonic obstruction. No small bowel distention noted. Mild gastric distention again noted. Electronically Signed   By: Marcello Moores  Register   On: 07/04/2020 12:49   DG Shoulder Left  Result Date: 07/04/2020 CLINICAL DATA:  Left shoulder pain.  Limited range of motion. EXAM: LEFT SHOULDER - 2+ VIEW COMPARISON:  11/29/2017. FINDINGS: No evidence of fracture. No gross dislocation, glenohumeral alignment is difficult to accurately assess on provided views. There is acromioclavicular degenerative change with spurring. Inferior glenohumeral spurring. Slight cortical irregularity of the humeral head appears chronic. No evidence of avascular necrosis or bony destruction. No soft tissue calcifications. IMPRESSION: 1. No acute abnormality. 2. Acromioclavicular and glenohumeral degenerative change. Electronically Signed   By: Keith Rake M.D.   On: 07/04/2020 20:45    Review of Systems  HENT: Negative for ear discharge, ear pain, hearing loss and tinnitus.   Eyes: Negative for photophobia and pain.  Respiratory: Negative for cough and shortness of breath.   Cardiovascular: Negative for chest pain.  Gastrointestinal: Negative for abdominal pain, nausea and vomiting.  Genitourinary: Positive  for dysuria. Negative for flank pain, frequency and urgency.  Musculoskeletal: Positive for arthralgias (Left shoulder). Negative for back pain, myalgias and neck pain.  Neurological: Negative for dizziness and headaches.  Hematological: Does not bruise/bleed easily.  Psychiatric/Behavioral: The patient is not nervous/anxious.    Blood pressure (!) 122/56, pulse 80, temperature 98.3 F (36.8 C), temperature source Axillary, resp. rate 16, height 6' (1.829 m), weight 103.1 kg, SpO2 98 %. Physical Exam Constitutional:      General: He is not in acute distress.    Appearance: He is well-developed. He is not diaphoretic.  HENT:     Head: Normocephalic and  atraumatic.  Eyes:     General: No scleral icterus.       Right eye: No discharge.        Left eye: No discharge.     Conjunctiva/sclera: Conjunctivae normal.  Neck:     Comments: Cervical compression with referred pain to left shoulder Cardiovascular:     Rate and Rhythm: Normal rate and regular rhythm.  Pulmonary:     Effort: Pulmonary effort is normal. No respiratory distress.  Musculoskeletal:     Cervical back: Normal range of motion. Pain with movement present.     Comments: Left shoulder, elbow, wrist, digits- no skin wounds, mod TTP diffuse shoulder and deltoid, no instability, no blocks to motion, PROM external rotation limited about 10 degrees and abduction also limited about 10 degrees (that is, abd to 80 degrees)  Sens  Ax/R/M/U intact  Mot   Ax/ R/ PIN/ M/ AIN/ U intact  Rad 2+  Skin:    General: Skin is warm and dry.  Neurological:     Mental Status: He is alert.  Psychiatric:        Behavior: Behavior normal.     Assessment/Plan: Left shoulder pain -- He has decent motion in the shoulder despite the pain and I feel no mechanical blocks to movement so doubt adhesive capsulitis. I worry this may be cervical in nature given my exam. Rotator cuff pathology would also be common at his age. Will get MRI c-spine and  shoulder to try to delineate further.    Lisette Abu, PA-C Orthopedic Surgery [phone] 07/05/2020, 1:39 PM

## 2020-07-05 NOTE — Consult Note (Addendum)
[facility] Consult: 10:37 AM 07/05/2020  LOS: 9 days    Referring Provider: Dr Dorian Pod  Primary Care Physician:  Gaylan Gerold, DO Primary Gastroenterologist:  Althia Forts.      Reason for Consultation: Constipation, ileus.   HPI: [name] is a 69 y.o. male.  PMH hypertension.  Obesity.  CAD.  CVA, bed bound.  Previous Plavix.  Seizures.  Substance and alcohol abuse.  COPD on home oxygen.  Obstructive sleep apnea.  Non-small cell lung cancer Dx 10/2018, s/p SBRT.  DVT.  PVD status post lower extremity bypass, R iliac stent.  COVID-19 pneumonia admission in January 2022.  IDA on oral iron at home, feraheme in 08/2018. Issues include GI bleed from gastric AVM in 08/2018.  Colon polyps.  Status post Nissen fundoplication. 08/26/2018 flex sig, for 3 to 6 m rectal and descending polyps removed.  Nonbleeding hemorrhoids.  Stool in sigmoid and descending colon.  No active bleeding but unable to determine a source of bleeding in this incomplete colonoscopy.  Pathology was tubular adenomaand HP polyp without HGD.   08/26/2018.  Esophagus normal.  Intact fundoplication wrap.  Solitary, nonbleeding gastric AVM treated with APC.  Biopsy of gastritis.  Otherwise normal study to second duodenum.  Duodenal biopsy showed no villous atrophy, no inflammation, benign mucosa.  Gastric biopsy showed chronic inactive gastritis, no H. pylori.  Inpatient GI evaluation with Dr. Carlean Purl (covering for Dr Adriana Mccallum) 04/13/2020.  C/o abdominal pain, bloating.  He was hypotensive. CTAP w contrast: Inflammatory changes at mesenteric root extending to the duodenal sweep concerning for duodenitis/peptic ulcer disease vs nonspecific mesenteritis.  Chronic osteomyelitis related to large sacral decubitus.  Fecal retention.  Stable adrenal  nodule. Dr. Carlean Purl recommended EGD and colonoscopy by Dr. Adriana Mccallum but they never followed up on the patient 05/29/2020 CTAP w contrast: Moderate colonic stool burden.  Changes of previous aortobifem bypass.  Pt left SNF because of worsening sacral ulcers and has been at home with a caregiver.  Unsuccessfully trying to find another SNF. At home suffers from constipation alternating with diarrhea which sounds like it may be overflow diarrhea.  MiraLAX is helpful but not always.  Takes this 1 x daily.  Denies melena, blood per rectum, current abdominal pain.  Eats well.  No nausea.  Pt now hospital day 9.  Presented with dyspnea, hypoxia.  Nonpurulent cough, fast heart rate.  Ruled in for UTI, possible pneumonia.  Experiencing shoulder pain. CT chest shows sequela of chronic PE, changes in bilateral bases suspicious for aspiration.  Multivessel coronary calcification.  >  50% innominate artery stenosis.  Aortic atherosclerosis. Serial abdominal films confirm large colonic stool burden.  Nonspecific air-filled small bowel, ?  Jejunal thickening.  Distended stomach. Measures to address constipation include Dulcolax suppository on 5/23.  Senokot 2 p.o. daily, day 4. Linzess 72 mcg daily added 5/26. Overnight had formed brown stool.   Fortunately not receiving narcotics.  However some of his meds have constipating side effects including Cymbalta, Namenda, po iron, multivitamin with minerals.  HS trazodone was discontinued after 5/25.  Overnight he has managed to have a formed brown stool and as I examined him today he had passed a soft, large amount of unformed stool.  RD is managing nutritional deficits with supplements.     Past Medical History:  Diagnosis Date  . Acute blood loss anemia   . Acute CVA (cerebrovascular accident) ([place]) 08/27/2014  . Acute encephalopathy 11/28/2017  . Acute ischemic stroke ([place])   . AKI (acute kidney injury) ([place])   . Alcohol abuse    H/O  . Alcohol dependence  with withdrawal with complication ([place]) 8/54/6270  . Anxiety   . Arterial ischemic stroke, MCA (middle cerebral artery), right, acute ([place])   . Arthritis   . Asthma   . Binocular vision disorder with diplopia 01/03/2013  . CAD (coronary artery disease) 05/27/10   Cath: severe single vessell CAD left cx midportion obtuse marginal 2 to 3.  . Cerebral infarction due to embolism of right middle cerebral artery ([place])   . Cerebral infarction due to stenosis of right carotid artery ([place]) 11/01/2014  . Cerebral thrombosis with cerebral infarction 07/18/2016  . Cerebrovascular accident (CVA) due to stenosis of right carotid artery ([place])   . CHF (congestive heart failure) ([place])   . Closed blow-out fracture of floor of orbit ([place]) 01/19/2013  . COPD (chronic obstructive pulmonary disease) ([place])   . COPD (chronic obstructive pulmonary disease) ([place])   . CVA (cerebral vascular accident) ([place]) 08/26/2014  . Dementia due to another medical condition ([place]) 07/12/2015  . Depression   . Diabetes mellitus, type 2 ([place]) 03/13/2015  . Diastolic CHF, acute on chronic (HCC) 11/01/2014  . DOE (dyspnea on exertion) 11/23/2014  . DVT (deep venous thrombosis) ([place]) 02/22/2014  . Embolic stroke ([place])   . Enophthalmos due to trauma 01/19/2013  . Eyelid lesion 01/24/2013  . Fracture of inferior orbital wall ([place]) 01/03/2013  . Fracture of orbital floor ([place]) 01/03/2013  . GERD (gastroesophageal reflux disease)   . H/O total knee replacement 10/24/2014  . HCAP (healthcare-associated pneumonia) 10/15/2015  . Headache around the eyes 08/27/2014  . History of DVT (deep vein thrombosis)   . History of DVT of lower extremity   . Hyperkalemia 10/24/2014  . Hyperlipemia   . Hyperlipidemia   . Hypertension   . Hypoalbuminemia due to protein-calorie malnutrition ([place])   . Hypotension   . Labile blood pressure   . Left shoulder pain 09/28/2017  . Lower extremity edema 02/21/2014  . Medial orbital wall fracture (HCC) 01/19/2013   . Myocardial infarction ([place]) 2000  . Orbital fracture ([place]) 12/2012  . OSA on CPAP   . Pain of left eye 01/03/2013  . Peripheral vascular disease, unspecified ([place])    08/20/10 doppler: increase in right ABI post-op. Left ABI stable. S/P bi-fem bypass surgery  . Pneumonia 04/04/2012  . Right middle cerebral artery stroke ([place]) 01/18/2018  . Seizures ([place])   . Shortness of breath   . Stroke (cerebrum) ([place]) 02/06/2015  . Thrombocytopenia ([place])   . Tobacco abuse   . Trichiasis without entropion 04/16/2013   Overview:  LLL central   . Type 2 diabetes mellitus with peripheral neuropathy ([place]) 03/13/2015  . Urinary incontinence 03/14/2015    Past Surgical History:  Procedure Laterality Date  . abdoninal ao angio & bifem angio  05/27/10   Patent graft, occluded bil stents with no retrograde flow into the hypogastric arteries. 100% occl left ant. tibial artery, 70% to 80% to 100% stenosis right superficial fem artery above adductor  canal. 100 % occl right ant. tibial vessell  . Aortogram w/ PTCA  09/292003  . BACK SURGERY    . BIOPSY  08/26/2018   Procedure: BIOPSY;  Surgeon: Lavena Bullion, DO;  Location: [facility];  Service: Gastroenterology;;  . CARDIAC CATHETERIZATION  05/27/10   severe CAD left cx  . CHOLECYSTECTOMY    . ESOPHAGOGASTRIC FUNDOPLICATION    . ESOPHAGOGASTRODUODENOSCOPY (EGD) WITH PROPOFOL N/A 08/26/2018   Procedure: ESOPHAGOGASTRODUODENOSCOPY (EGD) WITH PROPOFOL;  Surgeon: Lavena Bullion, DO;  Location: [facility];  Service: Gastroenterology;  Laterality: N/A;  . EYE SURGERY    . FEMORAL BYPASS  08/19/10   Right Fem-Pop  . FLEXIBLE SIGMOIDOSCOPY N/A 08/26/2018   Procedure: FLEXIBLE SIGMOIDOSCOPY;  Surgeon: Lavena Bullion, DO;  Location: [facility];  Service: Gastroenterology;  Laterality: N/A;  . HOT HEMOSTASIS N/A 08/26/2018   Procedure: HOT HEMOSTASIS (ARGON PLASMA COAGULATION/BICAP);  Surgeon: Lavena Bullion, DO;  Location: [facility];  Service:  Gastroenterology;  Laterality: N/A;  . IR ANGIO INTRA EXTRACRAN SEL COM CAROTID INNOMINATE BILAT MOD SED  07/20/2016  . IR ANGIO INTRA EXTRACRAN SEL COM CAROTID INNOMINATE BILAT MOD SED  01/14/2018  . IR ANGIO VERTEBRAL SEL SUBCLAVIAN INNOMINATE UNI R MOD SED  07/20/2016  . IR ANGIO VERTEBRAL SEL VERTEBRAL BILAT MOD SED  01/14/2018  . IR ANGIO VERTEBRAL SEL VERTEBRAL UNI L MOD SED  07/20/2016  . IR RADIOLOGIST EVAL & MGMT  05/27/2016  . IR US GUIDE VASC ACCESS RIGHT  01/14/2018  . JOINT REPLACEMENT    . PERIPHERAL VASCULAR CATHETERIZATION N/A 12/10/2014   Procedure: Abdominal Aortogram;  Surgeon: Angelia Mould, MD;  Location: [facility];  Service: Cardiovascular;  Laterality: N/A;  . POLYPECTOMY  08/26/2018   Procedure: POLYPECTOMY;  Surgeon: Lavena Bullion, DO;  Location: [facility];  Service: Gastroenterology;;  . RADIOLOGY WITH ANESTHESIA N/A 02/08/2015   Procedure: RADIOLOGY WITH ANESTHESIA;  Surgeon: Luanne Bras, MD;  Location: [facility];  Service: Radiology;  Laterality: N/A;  . TEE WITHOUT CARDIOVERSION N/A 08/29/2014   Procedure: TRANSESOPHAGEAL ECHOCARDIOGRAM (TEE);  Surgeon: Josue Hector, MD;  Location: [facility];  Service: Cardiovascular;  Laterality: N/A;  . TOTAL KNEE ARTHROPLASTY      Prior to Admission medications   Medication Sig Start Date End Date Taking? Authorizing Provider  aspirin EC 81 MG tablet Take 1 tablet (81 mg total) by mouth daily. Swallow whole. 05/02/20 05/02/21 Yes Gaylan Gerold, DO  atorvastatin (LIPITOR) 80 MG tablet Take 1 tablet (80 mg total) by mouth daily at 6 PM. 05/02/20  Yes Gaylan Gerold, DO  Chlorhexidine Gluconate Cloth 2 % PADS APPLY 6 EACH TOPICALLY DAILY. Patient taking differently: Apply 6 each topically daily. 04/18/20 04/18/21 Yes Marianna Payment, MD  DULoxetine (CYMBALTA) 20 MG capsule TAKE 2 CAPSULES (40 MG TOTAL) BY MOUTH AT BEDTIME. Patient taking differently: Take 40 mg by mouth at bedtime. 04/18/20 04/18/21 Yes Marianna Payment, MD  ferrous sulfate 324 MG TBEC Take 324 mg by mouth daily.   Yes [provider]  furosemide (LASIX) 40 MG tablet TAKE 1 TABLET BY MOUTH (40MG  TOTAL) EVERY DAY Patient taking differently: Take 40 mg by mouth daily. 06/20/20  Yes Gaylan Gerold, DO  memantine (NAMENDA) 10 MG tablet Take 1 tablet (10 mg total) by mouth 2 (two) times daily. 05/02/20 04/27/21 Yes Gaylan Gerold, DO  methocarbamol (ROBAXIN) 750 MG tablet TAKE 1 TABLET (750 MG TOTAL) BY MOUTH FOUR TIMES DAILY. Patient taking differently: Take 750 mg  by mouth 4 (four) times daily. 04/18/20 04/18/21 Yes Marianna Payment, MD  Multiple Vitamin (MULTIVITAMIN ADULT PO) Take 1 tablet by mouth daily.   Yes [provider]  pantoprazole (PROTONIX) 20 MG tablet Take 1 tablet (20 mg total) by mouth daily. 04/12/20  Yes Sharyn Lull A, PA-C  potassium chloride SA (KLOR-CON) 20 MEQ tablet TAKE 1 TABLET (20 MEQ TOTAL) BY MOUTH DAILY. Patient taking differently: Take 20 mEq by mouth daily. 04/18/20 04/18/21 Yes Marianna Payment, MD  pregabalin (LYRICA) 150 MG capsule Take 1 capsule (150 mg total) by mouth 2 (two) times daily. 06/20/20 09/18/20 Yes Gaylan Gerold, DO  senna-docusate (SENOKOT-S) 8.6-50 MG tablet TAKE 2 TABLETS BY MOUTH AT BEDTIME. Patient taking differently: Take 2 tablets by mouth at bedtime as needed for mild constipation. 04/18/20 04/18/21 Yes Marianna Payment, MD  sodium hypochlorite (DAKIN'S 1/2 STRENGTH) external solution Irrigate with 1 application as directed See admin instructions. Apply to sacral pressure wound bid 04/18/20  Yes Marianna Payment, MD  traZODone (DESYREL) 100 MG tablet Take 1 tablet (100 mg total) by mouth at bedtime. 06/20/20 09/18/20 Yes Gaylan Gerold, DO  triamcinolone (KENALOG) 0.1 % Apply 1 application topically every 12 (twelve) hours. Legs, itching 02/14/20  Yes [provider]  Grant Ruts INHUB 250-50 MCG/DOSE AEPB Inhale 1-2 puffs into the lungs daily. 04/18/20  Yes Marianna Payment, MD    Scheduled Meds: .  acetaminophen  1,000 mg Oral TID  . aspirin EC  81 mg Oral Daily  . atorvastatin  80 mg Oral q1800  . DULoxetine  60 mg Oral QHS  . enoxaparin (LOVENOX) injection  40 mg Subcutaneous Q24H  . guaiFENesin  1,200 mg Oral BID  . lidocaine  1 patch Transdermal Q24H  . linaclotide  72 mcg Oral QAC breakfast  . memantine  10 mg Oral BID  . mometasone-formoterol  2 puff Inhalation BID  . multivitamin with minerals  1 tablet Oral Daily  . nutrition supplement (JUVEN)  1 packet Oral BID BM  . pantoprazole  20 mg Oral Daily  . pregabalin  150 mg Oral BID  . senna-docusate  2 tablet Oral BID  . triamcinolone cream  1 application Topical [id number]   Infusions:  PRN Meds: Zinc Oxide, zolpidem   Allergies as of 06/26/2020 - Review Complete 06/26/2020  Allergen Reaction Noted  . Darvocet [propoxyphene n-acetaminophen] Hives 08/28/2010  . Haldol [haloperidol decanoate] Hives 08/28/2010  . Acetaminophen Nausea Only 11/01/2014  . Metformin and related Nausea And Vomiting 10/15/2015  . Norco [hydrocodone-acetaminophen] Nausea And Vomiting 12/07/2014    Family History  Problem Relation Age of Onset  . Heart attack Mother   . Heart failure Mother   . Cirrhosis Father   . Heart failure Brother   . Cancer Brother   . Hypertension Neg Hx        UNKNOWN  . Stroke Neg Hx        UNKNOWN    Social History   Socioeconomic History  . Marital status: Divorced    Spouse name: Not on file  . Number of children: Not on file  . Years of education: Not on file  . Highest education level: Not on file  Occupational History  . Not on file  Tobacco Use  . Smoking status: Current Every Day Smoker    Packs/day: 0.50    Years: 50.00    Pack years: 25.00    Types: Cigars, Cigarettes    Start date: 02/09/1961  . Smokeless tobacco: Former Systems developer  Types: Chew  . Tobacco comment: 2 ppd, full flavor  Substance and Sexual Activity  . Alcohol use: Yes    Comment: occasionally, "I might drink 1 every 6  months"  . Drug use: Yes    Types: Cocaine    Comment: "I smoke crack every once in a while"; last smoked yesterday  . Sexual activity: Yes  Other Topics Concern  . Not on file  Social History Narrative   Recovering alcoholic   Social Determinants of Health   Financial Resource Strain: Not on file  Food Insecurity: Not on file  Transportation Needs: Not on file  Physical Activity: Not on file  Stress: Not on file  Social Connections: Not on file  Intimate Partner Violence: Not on file    REVIEW OF SYSTEMS: Constitutional: Bedbound at baseline. ENT:  No nose bleeds Pulm: No shortness of breath or cough. CV:  No palpitations, no LE edema.  No angina GU:  No hematuria, no frequency GI: See HPI. Heme: No unusual bleeding or bruising. Transfusions: None. Neuro:  No headaches, no peripheral tingling or numbness.  No syncope, no seizures.  No confusion Derm:  No itching, no rash or sores.  Endocrine:  No sweats or chills.  No polyuria or dysuria Immunization: Reviewed. Travel:  None    PHYSICAL EXAM: Vital signs in last 24 hours: Vitals:   07/05/20 0300 07/05/20 0411  BP:  (!) 122/56  Pulse: 77 80  Resp: 16 16  Temp: 98.3 F (36.8 C) 98.3 F (36.8 C)  SpO2:  98%   Wt Readings from Last 3 Encounters:  07/05/20 103.1 kg  04/13/20 105.5 kg  03/02/20 104.7 kg    General: Patient is chronically unwell, obese.  Resting in bed comfortable. Head: Facial asymmetry or swelling.  No signs of head trauma. Eyes: No conjunctival pallor.  No scleral icterus.  EOMI Ears: No hearing deficit Nose: No congestion or discharge. Mouth: Oral mucosa is pink, moist, clear.  Tongue midline. Neck: No JVD, no masses, no thyromegaly Lungs: Clear bilaterally with overall reduced breath sounds.  No labored breathing.  No cough. Heart: RRR.  No MRG.  S1, S2 present. Abdomen: Obese without tenderness.  Active bowel sounds.  No masses, HSM, bruits, hernias..   Rectal: Generous handful of  brown, soft stool sitting at the rectum with more soft stool within the rectum.  No blood.  No masses palpated.  No tenderness.  Large pressure dressing covering the sacrum was not removed to examine underneath. Musc/Skeltl: No gross joint deformities. Extremities: 1+ lower extremity edema. Neurologic: Alert.  Oriented x3.  Weakness on the left side. Skin: Pale. Nodes: No cervical adenopathy Psych: Calm, pleasant, cooperative.  Intake/Output from previous day: 05/26 0701 - 05/27 0700 In: 1830 [P.O.:1830] Out: 2800 [Urine:2800] Intake/Output this shift: Total I/O In: 240 [P.O.:240] Out: -   LAB RESULTS: Recent Labs    07/03/20 0046 07/04/20 1057  WBC 6.7 5.3  HGB 10.1* 10.2*  HCT 33.2* 33.8*  PLT 199 199   BMET Lab Results  Component Value Date   NA 144 07/01/2020   NA 144 06/30/2020   NA 143 06/29/2020   K 4.4 07/01/2020   K 4.2 06/30/2020   K 4.4 06/29/2020   CL 108 07/01/2020   CL 109 06/30/2020   CL 109 06/29/2020   CO2 30 07/01/2020   CO2 31 06/30/2020   CO2 30 06/29/2020   GLUCOSE 123 (H) 07/01/2020   GLUCOSE 140 (H) 06/30/2020   GLUCOSE 140 (  H) 06/29/2020   BUN 25 (H) 07/01/2020   BUN 22 06/30/2020   BUN 24 (H) 06/29/2020   CREATININE 0.61 07/01/2020   CREATININE 0.44 (L) 06/30/2020   CREATININE 0.52 (L) 06/29/2020   CALCIUM 9.9 07/01/2020   CALCIUM 9.8 06/30/2020   CALCIUM 9.8 06/29/2020   LFT No results for input(s): PROT, ALBUMIN, AST, ALT, ALKPHOS, BILITOT, BILIDIR, IBILI in the last 72 hours. PT/INR Lab Results  Component Value Date   INR 1.1 05/29/2020   INR 1.0 10/27/2018   INR 1.2 08/22/2018   Hepatitis Panel No results for input(s): HEPBSAG, HCVAB, HEPAIGM, HEPBIGM in the last 72 hours. C-Diff No components found for: CDIFF Lipase     Component Value Date/Time   LIPASE 23 05/28/2020 2000    Drugs of Abuse     Component Value Date/Time   LABOPIA NONE DETECTED 08/22/2018 1900   COCAINSCRNUR NONE DETECTED 08/22/2018 1900    LABBENZ NONE DETECTED 08/22/2018 1900   AMPHETMU NONE DETECTED 08/22/2018 1900   THCU NONE DETECTED 08/22/2018 1900   LABBARB NONE DETECTED 08/22/2018 1900     RADIOLOGY STUDIES: DG Abd 1 View  Result Date: 07/04/2020 CLINICAL DATA:  Abdominal distention. EXAM: ABDOMEN - 1 VIEW COMPARISON:  07/01/2020.  CT 05/29/2020. FINDINGS: Surgical clips noted in the upper abdomen and over both inguinal regions. Right iliac stent again noted. Aortoiliac atherosclerotic vascular calcification. Large amount of stool again noted in the colon and rectum suggesting constipation. Moderate colonic distention noted on today's exam. No small bowel distention. Mild gastric distention remains. No free air. Bibasilar atelectasis. Degenerative changes lumbar spine and both hips. IMPRESSION: Large amount of stool again noted throughout the colon and rectum. Constipation could present this fashion. There is associated moderate colonic distention noted on today's exam. Follow-up exam suggested to demonstrate resolution in order to exclude colonic obstruction. No small bowel distention noted. Mild gastric distention again noted. Electronically Signed   By: Marcello Moores  Register   On: 07/04/2020 12:49   DG Shoulder Left  Result Date: 07/04/2020 CLINICAL DATA:  Left shoulder pain.  Limited range of motion. EXAM: LEFT SHOULDER - 2+ VIEW COMPARISON:  11/29/2017. FINDINGS: No evidence of fracture. No gross dislocation, glenohumeral alignment is difficult to accurately assess on provided views. There is acromioclavicular degenerative change with spurring. Inferior glenohumeral spurring. Slight cortical irregularity of the humeral head appears chronic. No evidence of avascular necrosis or bony destruction. No soft tissue calcifications. IMPRESSION: 1. No acute abnormality. 2. Acromioclavicular and glenohumeral degenerative change. Electronically Signed   By: Keith Rake M.D.   On: 07/04/2020 20:45     IMPRESSION:   *   Chronic  constipation.   Seems to be doing a bit better after 2 days of Linzess and discontinuation of trazodone.  *   Adenomatous and hyperplastic colon polyps on incomplete colonoscopy 2020  *   Previous Nissen fundoplication.  *   Nonbleeding gastric AVM treated with APC, nonspecific gastritis at EGD in 08/2018.    PLAN:     *   Will order MiraLAX/Gatorade prep to address current retained stool.  After that continue Linzess and bid MiraLAX titrate to obtain at least 1 large or 2 to 3 smaller daily BM's.  .  *   stop oral iron?, feraheme infusions prn?   Azucena Freed  07/05/2020, 10:37 AM Phone [phone]  Acute on chronic constipation in a patient with general debility, bedbound multiple complex medical conditions.  I personally reviewed the KUBs, little progress  has been made on the constipation during the hospital stay.  He does not have a bowel obstruction has not been vomiting.  I discontinued the Linzess.  We are going to give him a MiraLAX/Gatorade bowel purge.  If that does not relieve his constipation and show improvement on the KUB, then he would need a full MoviPrep oral bowel prep.  I agree oral iron is most likely worsening his chronic constipation, and he would be better off with IV iron, including first dose while he is here.  No current plans for endoscopic testing.  Thanks for the consult, signing off.

## 2020-07-06 ENCOUNTER — Inpatient Hospital Stay (HOSPITAL_COMMUNITY): Payer: Medicare Other

## 2020-07-06 NOTE — Progress Notes (Addendum)
Subjective:  Patient feels much better today for many reasons. He states that his bowels have moved "hard" stool overnight and that his abdomen feels much less tense. His shoulder pain is slightly improved with continued Tylenol and Lidocaine patches, but it has moved to the front of his shoulder instead of on the back of his shoulder. His sacral ulcer is still painful, but not "nearly as bad" as a couple of days ago. His heels do not hurt him much anymore unless he slides them across the bed. He no longer is experiencing pain with urination after the condom catheter was removed. His breathing is also better, and he feels that his sputum is improved to the point that his voice sounds much clearer.  Objective:  Vital signs in last 24 hours: Vitals:   07/05/20 1503 07/05/20 2046 07/05/20 2113 07/06/20 0633  BP: 114/61  (!) 117/55 124/64  Pulse: 80  78 74  Resp: 18  17 16   Temp: 98.6 F (37 C)  98.9 F (37.2 C) 98.7 F (37.1 C)  TempSrc: Axillary  Axillary Axillary  SpO2: 100% 98% 93% 96%  Weight:      Height:       Weight change:   Intake/Output Summary (Last 24 hours) at 07/06/2020 0913 Last data filed at 07/06/2020 0820 Gross per 24 hour  Intake 720 ml  Output 3250 ml  Net -2530 ml   PHYSICAL EXAM  GEN: Well developed, in NAD, laying in bed dozing off while watching TV CVS: Heart sounds appreciated today. RRR, normal S1/S2. No murmurs, rubs, or gallops. No lower extremity edema. RESP: Breathing comfortably on 2L [place]. Persistent rhonchi noted in anterior lung fields bilaterally. ABD: Softer today on exam and less tense. Bowel sounds hypoactive. Dull to percussion mainly on R side of abdomen. Tympanic to percussion elsewhere. MSK: Left shoulder less tender to palpation. Tenderness seems to have migrated to anterior shoulder from left scapula. GU: Foley catheter inserted. Glans does not appear as macerated as two days ago. Purple discoloration around edgs of glans has mostly  resolved. There are small abrasions and a small vesicle on the underside of penis shaft. Slight pain experienced on palpation of abrasions. EXT: Blisters on medial calcaneous bilaterally have resolved. Heel protectors intact bilaterally. PSY: Happy mood today. Joking with medical team and smiling. Thought content and behavior normal.  Assessment/Plan:  Active Problems:   Acute respiratory failure [facility])  Patient Summary: [name] is a 70 y.o. male with a PMH of CVA, asthma, CAD, CHF, COPD, DVT who presents on hospital day 10 after admission for acute respiratory distress secondary to lack of oxygen in the setting of COPD.  1. Ileus with constipation Patient had a large bowel movement that was "hard" after miralax and gatorade, linzess, and senna. Abdomen is more soft and less tense with dullness to percussion mainly appreciated on R lateral abdomen now. Bowel sounds are appreciated again, albeit hypoactive. Overall, he is experiencing significant relief. -Continue miralax 17 g and gatorade BID -Continue linzess 72 mcg daily and senna two tablets twice daily   2. Post-stroke left shoulder pain Patient's pain is better today with continued use of lidocaine patches and tylenol. MRI c-spine demonstrated disc and facet degeneration with mild spinal stenosis at C4-C5 and moderate foraminal stenosis at C3-C4 on left side. MRI shoulder demonstrated mild tendinosis of supraspinatus tendon with partial-thickness articular surface tear, severe arthropathy of acromioclavicular joint, and mild muscle edema in the medial most aspect of supraspinatus, infraspinatus,  and subscapularis muscles possibly 2/2 to muscle strain or neurogenic edema. Ortho to follow up with recommendations. -Continue tylenol 1000 mg prn, lidocaine 5% patch BID for pain -Await orthopedic recommendations   3. Pressure wounds Sacral wound pain has improved after debridement yesterday by Drs. Elease Hashimoto. Will need to  examine sacral wound either tomorrow or Monday to assess need for further chemical debridement. Blisters on heels have resolved and heel protectors remain in place, although he does experience pain when moving feet across the bed, probably due to friction that causes the "burning" sensation he feels. Given aggressive bowel regimen that has produced hard stool but may produce diarrhea in future, will consult wound care nurse to determine any other recommendations. -Consulted wound care nurse for recommendations, awaiting call back -Continue zinc oxide 12.8% barrier cream, serial examinations with silver nitrate and debridement, heel protectors   4. Depression [name]'s mood and sleep are improving, likely 2/2 to improvement in pain and clinical status. Will continue treatment regimen to allow for consistent relief. After SpO2 remains stable when reducing SpO2, will reduce vitals checks to further aid sleep. -Continue duloxetine 60 mg and zolpidem 5 mg -Change vitals checks to Q nursing shift after SpO2 remains in target range    5. Dysuria Patient has foley catheter in place. His pain has resolved after removal of condom catheter. There are small abrasions and a small vesicle on underside of penis shaft that are likely secondary to local tissue trauma from adhesive of condom catheter. They are only painful to palpation. Overall, symptoms are much improved. -Continue to monitor symptoms and resolution of local tissue trauma   6. Resolved acute respiratory failure Patient remains stable on 2L nasal cannula with SpO2 96%, RR 16. Will ensure O2 is decreased to 1L [place] today and observe changes before changing vitals checks to Q every nursing shift to aid in sleep. -Oxygen 1L nasal cannula and observe response -Continue dulera, flutter device, Mucinex 1200 mg daily, suction   LOS: 10 days   Mikal Plane, Medical Student 07/06/2020, 9:13 AM Pager: [phone] After 5pm on weekdays and 1pm on  weekends: On Call pager [phone]  Attestation for Student Documentation:  I personally was present and performed or re-performed the history, physical exam and medical decision-making activities of this service and have verified that the service and findings are accurately documented in the student's note.  Maudie Mercury, MD 07/06/2020, 2:15 PM

## 2020-07-06 NOTE — Progress Notes (Signed)
Internal Medicine Attending Note:  This patient's plan of care was discussed with the house staff. Please see their note for complete details. I concur with their findings.  Velna Ochs, MD 07/06/2020, 8:44 PM

## 2020-07-07 DIAGNOSIS — J9601 Acute respiratory failure with hypoxia: Secondary | ICD-10-CM

## 2020-07-07 DIAGNOSIS — K567 Ileus, unspecified: Secondary | ICD-10-CM

## 2020-07-07 LAB — CBC
HCT: 34.9 % — ABNORMAL LOW (ref 39.0–52.0)
Hemoglobin: 10.6 g/dL — ABNORMAL LOW (ref 13.0–17.0)
MCH: 26.7 pg (ref 26.0–34.0)
MCHC: 30.4 g/dL (ref 30.0–36.0)
MCV: 87.9 fL (ref 80.0–100.0)
Platelets: 209 10*3/uL (ref 150–400)
RBC: 3.97 MIL/uL — ABNORMAL LOW (ref 4.22–5.81)
RDW: 14.8 % (ref 11.5–15.5)
WBC: 5.2 10*3/uL (ref 4.0–10.5)
nRBC: 0 % (ref 0.0–0.2)

## 2020-07-07 LAB — BASIC METABOLIC PANEL
Anion gap: 4 — ABNORMAL LOW (ref 5–15)
BUN: 20 mg/dL (ref 8–23)
CO2: 32 mmol/L (ref 22–32)
Calcium: 9.8 mg/dL (ref 8.9–10.3)
Chloride: 107 mmol/L (ref 98–111)
Creatinine, Ser: 0.41 mg/dL — ABNORMAL LOW (ref 0.61–1.24)
GFR, Estimated: >60 mL/min (ref >60)
Glucose, Bld: 115 mg/dL — ABNORMAL HIGH (ref 70–99)
Potassium: 4.3 mmol/L (ref 3.5–5.1)
Sodium: 143 mmol/L (ref 135–145)

## 2020-07-07 LAB — SARS CORONAVIRUS 2 (TAT 6-24 HRS): SARS Coronavirus 2: NEGATIVE

## 2020-07-07 IMAGING — CT NUCLEAR MEDICINE PET IMAGE INITIAL (PI) SKULL BASE TO THIGH
1 of 8 series · 3 of 16 positions shown, 4 images · non-contrast
Comparison: CT chest abdomen pelvis dated 08/23/2018

CLINICAL DATA: Initial treatment strategy for non-small cell lung
cancer.

EXAM:
NUCLEAR MEDICINE PET SKULL BASE TO THIGH
TECHNIQUE: 10.9 mCi F-18 FDG was injected intravenously. Full-ring PET imaging
was performed from the skull base to thigh after the radiotracer. CT
data was obtained and used for attenuation correction and anatomic
localization.
Fasting blood glucose: 105 mg/dl

[Series 4: ct sk_thigh 5.0 hd_fov · axial · 1.07mm/px · z∈[+22,+1006]mm · 3 of 247 slices shown, 4 images]
[im 1/247  soft-tissue]
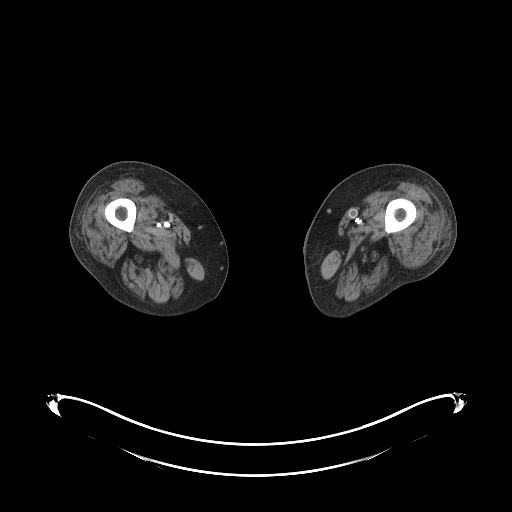
[im 1/247  bone]
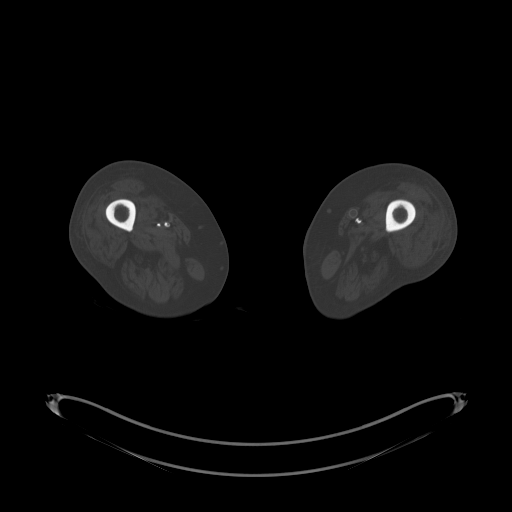
[im 124/247  soft-tissue]
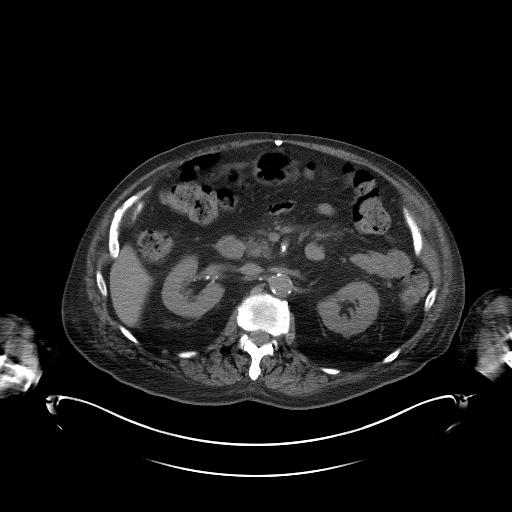
[im 247/247  soft-tissue]
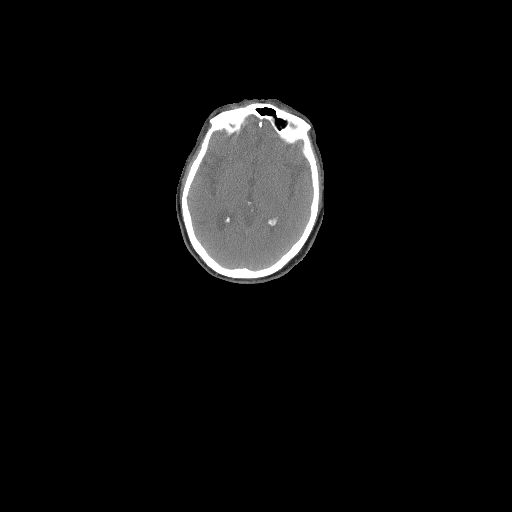

[3 of 16 positions shown; findings below may reference images not displayed]

FINDINGS: Mediastinal blood pool activity: SUV max

Liver activity: SUV max NA

NECK: No hypermetabolic cervical lymphadenopathy.

Incidental CT findings: none

CHEST: 21 x 13 mm right upper lobe pulmonary nodule (series 8/image
29), grossly unchanged, max SUV 10.0. This appearance is compatible
with primary bronchogenic neoplasm.

No hypermetabolic thoracic lymphadenopathy.

Incidental CT findings: Atherosclerotic calcifications of the aortic
arch. Three vessel coronary atherosclerosis.

ABDOMEN/PELVIS: No hypermetabolic rectal mass.

No abnormal metabolism in the liver, spleen, pancreas, or right
adrenal gland. 12 mm left adrenal adenoma demonstrates mild
hypermetabolism, max SUV 3.5.

No hypermetabolic abdominopelvic lymphadenopathy.

Incidental CT findings: Status post cholecystectomy. Postsurgical
changes related to prior Nissen fundoplication. Atherosclerotic
calcifications the abdominal aorta and branch vessels. Aorto
bi-iliac grafts.

SKELETON: No focal hypermetabolic activity to suggest skeletal
metastasis.

Incidental CT findings: Degenerative changes of the visualized
thoracolumbar spine.
IMPRESSION: Hypermetabolic 2.1 cm right upper lobe pulmonary nodule, compatible
primary bronchogenic neoplasm.

No findings suspicious for metastatic disease.

No evidence of hypermetabolic rectal mass.

Stable left adrenal adenoma, benign.

## 2020-07-07 NOTE — Discharge Summary (Signed)
Name: [name] MRN: [id number] DOB: 1950/08/29 70 y.o. PCP: Kenneth Gerold, DO  Date of Admission: 06/26/2020 11:28 AM Date of Discharge: 07/09/20 Attending Physician: Kenneth Ochs, MD  Discharge Diagnosis: 1. Ileus Secondary to Constipation  2. Post Stroke Left Shoulder Pain  3. Pressure Wounds 4. Depression.  5. Dysuria  6. Acute on Chronic Hypoxic Respiratory Failure Secondary to COPD  Discharge Medications: Allergies as of 07/09/2020      Reactions   Darvocet [propoxyphene N-acetaminophen] Hives   Haldol [haloperidol Decanoate] Hives   Acetaminophen Nausea Only   Upset stomach, tolerates Hydrocodone/APAP if taken with food   Metformin And Related Nausea And Vomiting   Norco [hydrocodone-acetaminophen] Nausea And Vomiting   Tolerates if taken with food      Medication List    STOP taking these medications   Chlorhexidine Gluconate Cloth 2 % Pads   ferrous sulfate 324 MG Tbec   furosemide 40 MG tablet Commonly known as: LASIX   methocarbamol 750 MG tablet Commonly known as: ROBAXIN   oxyCODONE 5 MG immediate release tablet Commonly known as: Oxy IR/ROXICODONE   potassium chloride SA 20 MEQ tablet Commonly known as: KLOR-CON   sodium hypochlorite external solution Commonly known as: DAKIN'S 1/2 STRENGTH   traZODone 100 MG tablet Commonly known as: DESYREL   triamcinolone cream 0.1 % Commonly known as: KENALOG     TAKE these medications   acetaminophen 500 MG tablet Commonly known as: TYLENOL Take 2 tablets (1,000 mg total) by mouth every 8 (eight) hours as needed.   aspirin EC 81 MG tablet Take 1 tablet (81 mg total) by mouth daily. Swallow whole.   atorvastatin 80 MG tablet Commonly known as: LIPITOR Take 1 tablet (80 mg total) by mouth daily at 6 PM.   DULoxetine 60 MG capsule Commonly known as: CYMBALTA Take 1 capsule (60 mg total) by mouth at bedtime. What changed:   medication strength  how much to take  how to take  this  when to take this   guaiFENesin 600 MG 12 hr tablet Commonly known as: MUCINEX Take 2 tablets (1,200 mg total) by mouth 2 (two) times daily as needed for cough or to loosen phlegm.   lidocaine 5 % Commonly known as: LIDODERM Place 1 patch onto the skin daily. Remove & Discard patch within 12 hours or as directed by MD   memantine 10 MG tablet Commonly known as: NAMENDA Take 1 tablet (10 mg total) by mouth 2 (two) times daily.   MULTIVITAMIN ADULT PO Take 1 tablet by mouth daily.   pantoprazole 20 MG tablet Commonly known as: PROTONIX Take 1 tablet (20 mg total) by mouth daily.   polyethylene glycol 17 g packet Commonly known as: MIRALAX / GLYCOLAX Take 17 g by mouth 2 (two) times daily.   pregabalin 150 MG capsule Commonly known as: Lyrica Take 1 capsule (150 mg total) by mouth 2 (two) times daily.   senna-docusate 8.6-50 MG tablet Commonly known as: Senokot-S Take 2 tablets by mouth 2 (two) times daily. What changed: when to take this   Wixela Inhub 250-50 MCG/ACT Aepb Generic drug: fluticasone-salmeterol Inhale 1-2 puffs into the lungs daily.   Zinc Oxide 12.8 % ointment Commonly known as: TRIPLE PASTE Apply topically as needed for irritation.            Durable Medical Equipment  (From admission, onward)         Start     Ordered   07/09/20 1348  DME Oxygen  Once       Question Answer Comment  Length of Need Lifetime   Mode or (Route) Nasal cannula   Liters per Minute 2   Frequency Continuous (stationary and portable oxygen unit needed)   Oxygen conserving device Yes   Oxygen delivery system Gas      07/09/20 1401           Discharge Care Instructions  (From admission, onward)         Start     Ordered   07/09/20 0000  Discharge wound care:       Comments: Wash sacral/coccyx wound with soap and water, pat dry. Place a size appropriate piece of Aquacel [name] [phone]) into the wound, cover with a Mepilex 6 x 6 dressing [name]  [phone]). Change daily.   07/09/20 1401   07/09/20 0000  Discharge wound care:       Comments: Wash sacral/coccyx wound with soap and water, pat dry. Place a size appropriate piece of Aquacel [name]) into the wound, cover with a Mepilex 6 x 6 dressing [name]). Change daily.   07/09/20 1401   07/09/20 0000  Discharge wound care:       Comments: Recommend patient have a referral to a dedicated wound care center or nurse for continued evaluation and ongoing treatment. [facility] would be suggestion of our wound care nurse.   07/09/20 1401          Disposition and follow-up:   [name] was discharged from [facility] in Stable condition.  At the hospital follow up visit please address:  1. Ileus Secondary to Constipation:    - Continue Miralax 17g BID until having 2-3 bowel movements daily, then de-escalate.   - Continue Sennakot-S BID  - Continue Linzess   2. Post Stroke Left Shoulder Pain:   - Continue Lidocaine patch   - Continue Tylenol  - Referral to Orthopaedic for shoulder injection  3. Pressure Wounds  - Patient will need a dedicated wound care nurse or referral to a wound care center.   4. Depression  - Continue Duloxetine 60 mg daily.   5. Dysuria   - Resolved  6. Acute on Chronic Hypoxic Respiratory Failure Secondary to COPD  - Continue 2L O2  - Continue Mucinex PRN  - Continue Flutter device  - Continue WIXELA INHUB 250-50 mcg/dose   2.  Labs / imaging needed at time of follow-up: None  3.  Pending labs/ test needing follow-up: None  Follow-up Appointments:  Hospital Course by problem list: 1. Ileus Secondary to Constipation  Patient presented with constipation for 2 weeks. Abdomen was more protuberant than his normal and firm. Demonstrated tenderness to palpation. Abdominal xray showed moderate colonic distension. Subsequent imaging showed large stool burden. Patient was given water enemas, miralax, linzess, and  sennakot-S. Patients distension and abdominal tenderness improved after Linzess, 64 oz of Gatorade with a container of Miralax gave a very large, hard bowel movement. He continued to have bowel movements with increased miralax, sennakot-S BID, and Linzess. He was discharged in stable condition on this regimen.   2. Chronic Post Stroke Left Shoulder Pain  Patient with chronic shoulder pain. Imaging showing acromioclavicular and glenohumeral degenerative changes. He was on opiates for his shoulder pain, but this was discontinued due to his constipation. He was managed with lidocaine patches and scheduled tylenol. His pain was partially controlled. MRI showed tendinosis of of the supraspinatus tendon, with partial thickness articular  surface tear. Ortho was consulted for steroid injection. They will have [name] follow in the outpatient setting.   3. Pressure Wounds Patient presented with a sacral ulcer wound. Wound care was consulted for recommendations. [name] sacral wound was washed with soap and water, and patted dry. Aquacel was placed into the wound and covered with Mepilex dressing and changed daily. His sacral ulcer was additionally chemically debrided using silver nitrate, with much relief from the patient.   He additionally had 2 new blisters on the medial calcaneous bilaterally he was placed in heel dressing and placed in prevalon boots. He was discharged in stable condition with instructions for his wound care.   4. Depression with Insomnia:  Patient with a History of depression since his stroke admitted to continued depression and lack of sleep. Psychiatry was consulted and they recommended increasing his duloxetine to 60 mg and better pain management. After optimizing his duloxetine, adding on Ambien, pain regimen, and having an impressively large bowel movement, the patient's mood markedly improved. Will continue with duloxetine and ambien on discharge.    5. Dysuria Secondary  To UTI  Patient endorsed dysuria on admission. His urinalysis expressed positive nitrites, large leukocytes, and many bacteria. He was initially treated with cefepime, in the setting of treatment for presumed pneumonia, and his antibiotic course was completed with oral Keflex.   After completing his antibiotic course, he had continued penile pain on urination. Repeat urine culture was negative. He was found to have maceration of the glans secondary to retained moisture from his condom catheter. He was switched to a foley with alleviation of his symptoms. Will switch back to foley prior to discharge to SNF.  6. Acute on Chronic Hypoxic Respiratory Failure Secondary to COPD Patient initially presented to the ED with acute on chronic hypoxic respiratory failure. He was found to be saturating at 70% on room air at his residence, and by the time EMS arrived he was saturating at 89%. His O2 saturation improved to 96% on CPAP, and he was transitioned to 15L in the ED. When the admitting team arrived, he was saturating with a SpO2 of 98-99% on 5 liter of O2. His initial labs showed a leukocytosis of 15.1. He was started on vancomycin and Cefepime.   His O2 saturations continued to improve to 2L, and his hypoxia was thought to be likely due to not receiving his home O2 after being discharged from his skilled nursing facility. Vancomycin and Cefepime were discontinued, and he continued to be stable on 2L O2. Discharging with orders for home O2.   Discharge Exam:   BP (!) 108/56 (BP Location: Right Arm)   Pulse 88   Temp 98.6 F (37 C) (Oral)   Resp 18   Ht 6' (1.829 m)   Wt 103.1 kg   SpO2 94%   BMI 30.83 kg/m   Physical Exam Constitutional:      General: He is not in acute distress.    Appearance: He is obese.     Comments: Laying in bed, able to answer questions appropriately.   HENT:     Head: Normocephalic and atraumatic.  Cardiovascular:     Rate and Rhythm: Normal rate and regular rhythm.      Pulses: Normal pulses.     Heart sounds: Normal heart sounds.  Pulmonary:     Effort: Pulmonary effort is normal.     Breath sounds: Normal breath sounds.  Abdominal:     General: Bowel sounds are normal.  Tenderness: There is no guarding or rebound.     Comments: Marked improvement in distension, BS+, minimally tender to palpation.  Skin:    General: Skin is warm and dry. Flushing of bilateral pretibial area chronic, no concerning for cellulitis. Frictions blisters of the medial heels dressed and blanching.  Neurological:     Mental Status: He is alert and oriented to person, place, and time.  Psychiatric:        Mood and Affect: Mood normal.        Behavior: Behavior normal.   Pertinent Labs, Studies, and Procedures:  CMP Latest Ref Rng & Units 07/07/2020 07/01/2020 06/30/2020  Glucose 70 - 99 mg/dL 115(H) 123(H) 140(H)  BUN 8 - 23 mg/dL 20 25(H) 22  Creatinine 0.61 - 1.24 mg/dL 0.41(L) 0.61 0.44(L)  Sodium 135 - 145 mmol/L 143 144 144  Potassium 3.5 - 5.1 mmol/L 4.3 4.4 4.2  Chloride 98 - 111 mmol/L 107 108 109  CO2 22 - 32 mmol/L 32 30 31  Calcium 8.9 - 10.3 mg/dL 9.8 9.9 9.8  Total Protein 6.5 - 8.1 g/dL - - -  Total Bilirubin 0.3 - 1.2 mg/dL - - -  Alkaline Phos 38 - 126 U/L - - -  AST 15 - 41 U/L - - -  ALT 0 - 44 U/L - - -   CBC Latest Ref Rng & Units 07/07/2020 07/04/2020 07/03/2020  WBC 4.0 - 10.5 K/uL 5.2 5.3 6.7  Hemoglobin 13.0 - 17.0 g/dL 10.6(L) 10.2(L) 10.1(L)  Hematocrit 39.0 - 52.0 % 34.9(L) 33.8(L) 33.2(L)  Platelets 150 - 400 K/uL 209 199 199   EXAM: MRI OF THE LEFT SHOULDER WITHOUT CONTRAST  TECHNIQUE: Multiplanar, multisequence MR imaging of the shoulder was performed. No intravenous contrast was administered.  COMPARISON:  None.  FINDINGS: Patient motion degrades image quality limiting evaluation.  Rotator cuff: Mild tendinosis of the supraspinatus tendon with a partial-thickness articular surface tear. Infraspinatus tendon is intact.  Teres minor tendon is intact. Subscapularis tendon is intact.  Muscles: Mild generalized muscle atrophy. Mild muscle edema in the medial most aspect of the supraspinatus, infraspinatus and subscapularis muscles which may be secondary to muscle strain versus neurogenic edema.  Biceps Long Head: Intraarticular and extraarticular portions of the biceps tendon are intact.  Acromioclavicular Joint: Severe arthropathy of the acromioclavicular joint. Type II acromion. No subacromial/subdeltoid bursal fluid.  Glenohumeral Joint: No joint effusion. Partial-thickness cartilage loss of the glenohumeral joint.  Labrum: Grossly intact, but evaluation is limited by lack of intraarticular fluid/contrast.  Bones: No fracture or dislocation. No aggressive osseous lesion.  Other: No fluid collection or hematoma.  IMPRESSION: 1. Mild tendinosis of the supraspinatus tendon with a partial-thickness articular surface tear. 2. Severe arthropathy of the acromioclavicular joint. 3. Mild muscle edema in the medial most aspect of the supraspinatus, infraspinatus and subscapularis muscles which may be secondary to muscle strain versus neurogenic edema.  EXAM: MRI CERVICAL SPINE WITHOUT CONTRAST  TECHNIQUE: Multiplanar, multisequence MR imaging of the cervical spine was performed. No intravenous contrast was administered.  COMPARISON:  Cervical spine CT 09/19/2017  FINDINGS: Alignment: Slight anterolisthesis at C5-6.  Vertebrae: No fracture, evidence of discitis, or bone lesion. Intervertebral ankylosis at C2-3  Cord: Unremarkable.  Posterior Fossa, vertebral arteries, paraspinal tissues: No perispinal mass or inflammation seen. Atrophy of intrinsic neck muscles.  Disc levels:  C2-3: Solid arthrodesis.  C3-4: Disc narrowing and bulging with uncovertebral ridging. Facet spurring on the left more than right. Moderate left more than right foraminal  stenosis.  C4-5: Disc  narrowing and bulging with mild uncovertebral and facet spurring. Patent appearance of the foramina. A central disc protrusion crowds the cord without deformity.  C5-6: Mild facet spurring and anterolisthesis. Minor disc bulging. No neural compression  C6-7: Disc narrowing with endplate and uncovertebral ridging but no neural compression. Negative facets.  C7-T1:Mild facet spurring.  No neural compression.  IMPRESSION: 1. No acute finding. 2. Disc and facet degeneration with up to mild spinal stenosis at C4-5. On the symptomatic left side there is moderate foraminal stenosis at C3-4  CLINICAL DATA:  Abdominal distension, diarrhea, shortness of breath and productive cough for several weeks  EXAM: ABDOMEN - 1 VIEW  COMPARISON:  Radiograph 06/27/2020, CT 05/29/2020  FINDINGS: Large colonic stool burden is noted. Additionally, there are numerous air-filled loops of nondistended small bowel throughout the abdomen though there may be some fold thickening of the jejunal loops in the left upper quadrant which could be seen in the setting of an enteritis. Stomach is distended with air without visible nasogastric decompression in place. Surgical clips noted in the upper abdomen. Vascular stent in the expected location of right iliac artery. Multiple surgical clips project over the left hip as well. Multilevel degenerative changes in the spine hips and pelvis unchanged comparison.  IMPRESSION: Large colonic stool burden, correlate for features of constipation/slowed intestinal transit.  Nonspecific diffusely air-filled appearance of much of the small bowel albeit with some questionable fold thickening of the jejunal loops which could be seen in the setting of an enteritis.  Air distention of the stomach, consider nasogastric decompression.  Postsurgical changes in the upper abdomen and left hip.  Aortic Atherosclerosis (ICD10-I70.0). Vascular stenting right  iliac artery.  EXAM: ABDOMEN - 1 VIEW  COMPARISON:  07/01/2020.  CT 05/29/2020.  FINDINGS: Surgical clips noted in the upper abdomen and over both inguinal regions. Right iliac stent again noted. Aortoiliac atherosclerotic vascular calcification. Large amount of stool again noted in the colon and rectum suggesting constipation. Moderate colonic distention noted on today's exam. No small bowel distention. Mild gastric distention remains. No free air. Bibasilar atelectasis. Degenerative changes lumbar spine and both hips.  IMPRESSION: Large amount of stool again noted throughout the colon and rectum. Constipation could present this fashion. There is associated moderate colonic distention noted on today's exam. Follow-up exam suggested to demonstrate resolution in order to exclude colonic obstruction. No small bowel distention noted. Mild gastric distention again noted.  EXAM: CT ANGIOGRAPHY CHEST WITH CONTRAST  TECHNIQUE: Multidetector CT imaging of the chest was performed using the standard protocol during bolus administration of intravenous contrast. Multiplanar CT image reconstructions and MIPs were obtained to evaluate the vascular anatomy.  CONTRAST:  18mL OMNIPAQUE IOHEXOL 350 MG/ML SOLN  COMPARISON:  04/12/2020  FINDINGS: Cardiovascular: There is adequate opacification of the pulmonary arterial tree. There is eccentric mural thrombus and synechia noted within the left lower lobe segmental pulmonary arteries most in keeping with chronic pulmonary embolism. There is, however, no intraluminal filling defect to suggest acute pulmonary embolism. The central pulmonary arteries are mildly enlarged, particularly on the left, in keeping with changes of pulmonary arterial hypertension.  Extensive multi-vessel coronary artery calcification. Global cardiac size is within normal limits. No pericardial effusion. Extensive mixed atherosclerotic plaque is noted within  the thoracic aorta. No aortic aneurysm. Particularly prominent atherosclerotic plaque is seen at the origin of the a innominate artery resulting in a greater than 50% stenosis, better appreciated on prior examination of 04/12/2020.  Mediastinum/Nodes: The visualized  thyroid is unremarkable. There is progressive right hilar and right paratracheal adenopathy, possibly reactive in nature. The esophagus is unremarkable. Small hiatal hernia noted. Surgical changes of a gastric fundoplication are identified.  Lungs/Pleura: Since the prior examination, there has developed bilateral lower lobe consolidation, right greater than left, as well as consolidation within the lateral segment of the right middle lobe suspicious for aspiration. Acute infection could appear similarly. Trace left pleural effusion is present, improved since prior examination. Parenchymal scarring within the right upper lobe with mild volume loss is unchanged. No pneumothorax. No central obstructing lesion.  Upper Abdomen: Cholecystectomy has been performed. No acute abnormality.  Musculoskeletal: Asymmetric sclerosis of the a left posterior seventh rib again noted possibly related to Paget's disease. No acute bone abnormality.  Review of the MIP images confirms the above findings.  IMPRESSION: No acute pulmonary embolism.  Sequela of a chronic pulmonary embolism with multiple synechia and eccentric mural thrombus within the left lower lobe. Morphologic changes in keeping with pulmonary arterial hypertension, more prominent involving the left pulmonary artery.  Interval development of bibasilar consolidation suspicious for changes of aspiration. Acute infection could appear similarly. Associated shotty right hilar and right paratracheal adenopathy is likely reactive in nature.  Extensive multi-vessel coronary artery calcification.  Peripheral vascular disease with greater than 50% stenosis of  the innominate artery.  Aortic Atherosclerosis (ICD10-I70.0). Discharge Instructions: Discharge Instructions    Call MD for:   Complete by: As directed    Call MD for:  difficulty breathing, headache or visual disturbances   Complete by: As directed    Call MD for:  persistant dizziness or light-headedness   Complete by: As directed    Call MD for:  persistant nausea and vomiting   Complete by: As directed    Call MD for:  temperature >100.4   Complete by: As directed    Discharge wound care:   Complete by: As directed    Wash sacral/coccyx wound with soap and water, pat dry. Place a size appropriate piece of Aquacel [name] [phone]) into the wound, cover with a Mepilex 6 x 6 dressing [name] [phone]). Change daily.   Discharge wound care:   Complete by: As directed    Wash sacral/coccyx wound with soap and water, pat dry. Place a size appropriate piece of Aquacel [name] [phone]) into the wound, cover with a Mepilex 6 x 6 dressing [name] [phone]). Change daily.   Discharge wound care:   Complete by: As directed    Recommend patient have a referral to a dedicated wound care center or nurse for continued evaluation and ongoing treatment. [facility] would be suggestion of our wound care nurse.   Increase activity slowly   Complete by: As directed    Increase activity slowly   Complete by: As directed    Increase activity slowly   Complete by: As directed       Signed: Iona Beard 07/09/20, 2:02 PM   Pager: [phone]

## 2020-07-07 NOTE — Progress Notes (Signed)
Subjective:  O/N Events: None  Mr. Lamson is found in good spirits this AM. He states that he just had a bowel movement, and it feels like "The pipes are finally getting unclogged." His shoulder pain is relatively controlled and unchanged. He is breathing well this AM.   Bedside Nurse confirmed that Mr. Markos had a large type 5 stool.   Objective:  Vital signs in last 24 hours: Vitals:   07/06/20 2014 07/06/20 2054 07/07/20 0007 07/07/20 0407  BP: 114/67   115/62  Pulse: 72  76 79  Resp: 17   18  Temp: 98.7 F (37.1 C)   98.9 F (37.2 C)  TempSrc: Axillary   Axillary  SpO2: 100% 99% 93% 93%  Weight:      Height:      Physical Exam Constitutional:      General: He is not in acute distress.    Appearance: He is obese.     Comments: Laying in bed, able to answer questions appropriately.   HENT:     Head: Normocephalic and atraumatic.  Cardiovascular:     Rate and Rhythm: Normal rate and regular rhythm.     Pulses: Normal pulses.     Heart sounds: Normal heart sounds.  Pulmonary:     Effort: Pulmonary effort is normal.     Breath sounds: Normal breath sounds.  Abdominal:     General: Bowel sounds are normal.     Tenderness: There is no guarding or rebound.     Comments: Marked improvement in distension, BS+, minimally tender to palpation.  Skin:    General: Skin is warm and dry.  Neurological:     Mental Status: He is alert and oriented to person, place, and time.  Psychiatric:        Mood and Affect: Mood normal.        Behavior: Behavior normal.    CBC Latest Ref Rng & Units 07/07/2020 07/04/2020 07/03/2020  WBC 4.0 - 10.5 K/uL 5.2 5.3 6.7  Hemoglobin 13.0 - 17.0 g/dL 10.6(L) 10.2(L) 10.1(L)  Hematocrit 39.0 - 52.0 % 34.9(L) 33.8(L) 33.2(L)  Platelets 150 - 400 K/uL 209 199 199   BMP Latest Ref Rng & Units 07/07/2020 07/01/2020 06/30/2020  Glucose 70 - 99 mg/dL 115(H) 123(H) 140(H)  BUN 8 - 23 mg/dL 20 25(H) 22  Creatinine 0.61 - 1.24 mg/dL 0.41(L) 0.61  0.44(L)  BUN/Creat Ratio 10 - 24 - - -  Sodium 135 - 145 mmol/L 143 144 144  Potassium 3.5 - 5.1 mmol/L 4.3 4.4 4.2  Chloride 98 - 111 mmol/L 107 108 109  CO2 22 - 32 mmol/L 32 30 31  Calcium 8.9 - 10.3 mg/dL 9.8 9.9 9.8    Assessment/Plan:  Active Problems:   Acute respiratory failure (HCC)  Chrisotpher L Burlesonis a 70 y.o.malewith a PMH of CVA, asthma, CAD, CHF, COPD, DVT who presentson hospital day10after admissionforacute respiratory distress secondary to lack of oxygen in the setting of COPD.  1. Ileus with constipation Patient doing well with current regimen. Bowel sounds present. Has some distension on today's examination, which is slightly improved from yesterday. Still has a small amount of tenderness to palpation. Will continue current regimen.  -Continue miralax 17 g and gatorade BID -Continue linzess 72 mcg daily and senna two tablets twice daily  2. Post-stroke left shoulder pain  MRI c-spine demonstrated disc and facet degeneration with mild spinal stenosis at C4-C5 and moderate foraminal stenosis at C3-C4 on left side. MRI shoulder  demonstrated mild tendinosis of supraspinatus tendon with partial-thickness articular surface tear, severe arthropathy of acromioclavicular joint, and mild muscle edema in the medial most aspect of supraspinatus, infraspinatus, and subscapularis muscles possibly 2/2 to muscle strain or neurogenic edema. Ortho to follow up in the outpatient setting. -Continue tylenol 1000 mg prn, lidocaine 5% patch BID for pain  3. Pressure wounds - Appreciate further wound care recommendations.  -Continue zinc oxide 12.8%barrier cream, serial examinations with silver nitrate and debridement,heel protectors  4. Depression -Continue duloxetine 60 mg and zolpidem 5 mg -Change vitals checks to Q nursing shift after SpO2 remains in target range  5.Dysuria Resolved with catheter placement.  -Continue to monitor symptoms and resolution of local  tissue trauma  6.Resolved acute respiratory failure Patient remainsstable on 2L nasal cannula with SpO2 96%, RR 16. Will ensure O2 is decreased to 1L [place] today and observe changes before changing vitals checks to Q every nursing shift to aid in sleep. -Oxygen1L nasal cannula and observe response -Continue dulera, flutter device, Mucinex 1200 mg daily, suction Prior to Admission Living Arrangement: Home Anticipated Discharge Location: Home Barriers to Discharge: Continued medical workup Dispo: Anticipated discharge in approximately 1-2 day(s).   Maudie Mercury, MD 07/07/2020, 6:24 AM Pager: [phone] After 5pm on weekdays and 1pm on weekends: On Call pager [phone]

## 2020-07-07 NOTE — TOC Progression Note (Addendum)
Transition of Care [facility]) - Progression Note    Patient Details  Name: DEMAURI ADVINCULA MRN: [id number] Date of Birth: [birth year]  Transition of [facility] CM/SW Contact  Elliot Gurney [place], [place] Phone Number: [phone] 07/07/2020, 12:13 PM  Clinical Narrative:    Phone call to [facility] message with Ebony Hail regarding placement today. VM left requesting a return call.  2:36pm Return call to [place], [facility] to discuss admission. Per Ebony Hail, she will submit for authorization for patient's return.   [address] LCSW Transition of Care [phone]     Expected Discharge Plan: [place] Barriers to Discharge: Continued Medical Work up,Insurance Authorization,SNF Pending bed offer  Expected Discharge Plan and Services Expected Discharge Plan: [place] In-house Referral: Clinical Social Work   Post Acute Care Choice: [place] Living arrangements for the past 2 months: [place]                                       Social Determinants of Health (SDOH) Interventions Housing Interventions: Intervention Not Indicated  Readmission Risk Interventions Readmission Risk Prevention Plan 08/23/2018  Transportation Screening Complete  PCP or Specialist Appt within 3-5 Days Complete  HRI or [place] Not Complete  Palliative Care Screening Complete  Medication Review (RN Care Manager) Referral to Pharmacy  Some recent data might be hidden

## 2020-07-08 NOTE — Progress Notes (Addendum)
Subjective:  O/N Events: None Patient examined at beside. State he just had a BM and abdomen is feeling better. Had some difficulty sleeping overnight. Discussed waiting on approval for placement at SNF. Patient is understanding and agreeable. .   Objective:  Vital signs in last 24 hours: Vitals:   07/07/20 2100 07/07/20 2104 07/07/20 2118 07/08/20 0439  BP:   (!) 123/59 (!) 120/52  Pulse: 79  76 79  Resp:    18  Temp:   98.4 F (36.9 C) 98.8 F (37.1 C)  TempSrc:   Oral Oral  SpO2: 93% 96% 93% 92%  Weight:      Height:      Physical Exam Constitutional:      General: He is not in acute distress.    Appearance: He is obese.     Comments: Laying in bed, able to answer questions appropriately.   HENT:     Head: Normocephalic and atraumatic.  Cardiovascular:     Rate and Rhythm: Normal rate and regular rhythm.     Pulses: Normal pulses.     Heart sounds: Normal heart sounds.  Pulmonary:     Effort: Pulmonary effort is normal.     Breath sounds: Normal breath sounds.  Abdominal:     General: Bowel sounds are normal.     Tenderness: There is no guarding or rebound.     Comments: Marked improvement in distension, BS+, minimally tender to palpation.  Skin:    General: Skin is warm and dry.  Neurological:     Mental Status: He is alert and oriented to person, place, and time.  Psychiatric:        Mood and Affect: Mood normal.        Behavior: Behavior normal.    CBC Latest Ref Rng & Units 07/07/2020 07/04/2020 07/03/2020  WBC 4.0 - 10.5 K/uL 5.2 5.3 6.7  Hemoglobin 13.0 - 17.0 g/dL 10.6(L) 10.2(L) 10.1(L)  Hematocrit 39.0 - 52.0 % 34.9(L) 33.8(L) 33.2(L)  Platelets 150 - 400 K/uL 209 199 199   BMP Latest Ref Rng & Units 07/07/2020 07/01/2020 06/30/2020  Glucose 70 - 99 mg/dL 115(H) 123(H) 140(H)  BUN 8 - 23 mg/dL 20 25(H) 22  Creatinine 0.61 - 1.24 mg/dL 0.41(L) 0.61 0.44(L)  BUN/Creat Ratio 10 - 24 - - -  Sodium 135 - 145 mmol/L 143 144 144  Potassium 3.5 - 5.1 mmol/L  4.3 4.4 4.2  Chloride 98 - 111 mmol/L 107 108 109  CO2 22 - 32 mmol/L 32 30 31  Calcium 8.9 - 10.3 mg/dL 9.8 9.9 9.8    Assessment/Plan:  Active Problems:   Acute respiratory failure (HCC)   Ileus (HCC)  [name] is a 70 y.o. male with a PMH of CVA, asthma, CAD, CHF, COPD, DVT who presents on hospital day 10 after admission for acute respiratory distress secondary to lack of oxygen in the setting of COPD.   1. Ileus with constipation - Resolved Patient doing well. Continues to have distention on examination, with minimal tenderness to palpation. Will continue current regimen.  -Continue miralax 17 g and gatorade BID -Continue linzess 72 mcg daily and senna two tablets twice daily   2. Post-stroke left shoulder pain Pain somewhat controlled will need ortho to follow up in the outpatient setting. -Continue tylenol 1000 mg prn, lidocaine 5% patch BID for pain   3. Pressure wounds -Continue zinc oxide 12.8% barrier cream, serial examinations with silver nitrate and debridement, heel protectors  4. Depression -Continue duloxetine 60 mg and zolpidem 5 mg   5. Dysuria Resolved with catheter placement.  -Continue to monitor symptoms and resolution of local tissue trauma   6. Resolved acute respiratory failure Stable on room air this morning - O2 as needed -Continue dulera, flutter device, Mucinex 1200 mg daily, suction  Prior to Admission Living Arrangement: Home Anticipated Discharge Location: SNF Barriers to Discharge: placement Dispo: Anticipated discharge in approximately 0-1 day(s).   Kenneth Beard, MD 07/08/2020, 6:37 AM Pager: [phone] After 5pm on weekdays and 1pm on weekends: On Call pager [phone]

## 2020-07-09 DIAGNOSIS — J9621 Acute and chronic respiratory failure with hypoxia: Principal | ICD-10-CM

## 2020-07-09 DIAGNOSIS — K5900 Constipation, unspecified: Secondary | ICD-10-CM | POA: Diagnosis not present

## 2020-07-09 DIAGNOSIS — K567 Ileus, unspecified: Secondary | ICD-10-CM | POA: Diagnosis not present

## 2020-07-09 DIAGNOSIS — J449 Chronic obstructive pulmonary disease, unspecified: Secondary | ICD-10-CM

## 2020-07-09 DIAGNOSIS — F32A Depression, unspecified: Secondary | ICD-10-CM | POA: Diagnosis not present

## 2020-07-09 MED ORDER — ACETAMINOPHEN 500 MG PO TABS: 1000.0000 mg | ORAL_TABLET | Freq: Three times a day (TID) | ORAL | 0 refills | Status: DC | PRN

## 2020-07-09 MED ORDER — DULOXETINE HCL 60 MG PO CPEP: 60.0000 mg | ORAL_CAPSULE | Freq: Every day | ORAL | 0 refills | Status: DC

## 2020-07-09 MED ORDER — POLYETHYLENE GLYCOL 3350 17 G PO PACK: 17.0000 g | PACK | Freq: Two times a day (BID) | ORAL | 0 refills | Status: DC

## 2020-07-09 MED ORDER — SENNOSIDES-DOCUSATE SODIUM 8.6-50 MG PO TABS
2.0000 | ORAL_TABLET | Freq: Two times a day (BID) | ORAL | 0 refills | Status: DC
Start: 2020-07-09 — End: 2022-08-05

## 2020-07-09 MED ORDER — LIDOCAINE 5 % EX PTCH: 1.0000 | MEDICATED_PATCH | CUTANEOUS | 0 refills | Status: DC

## 2020-07-09 MED ORDER — ZINC OXIDE 12.8 % EX OINT
TOPICAL_OINTMENT | CUTANEOUS | 0 refills | Status: DC | PRN
Start: 2020-07-09 — End: 2022-08-05

## 2020-07-09 MED ORDER — GUAIFENESIN ER 600 MG PO TB12: 1200.0000 mg | ORAL_TABLET | Freq: Two times a day (BID) | ORAL | 0 refills | Status: AC | PRN

## 2020-07-09 NOTE — Progress Notes (Signed)
Occupational Therapy Treatment Patient Details Name: [name] MRN: [id number] DOB: 1950/05/14 Today's Date: 07/09/2020    History of present illness 70 yo male presenting 5/18 to ED with reports of increasing dyspnea.  SaO2 on RA in 70% in field, Placed on CPAP and improved to 96%O2 pt transitioned to 5L via [place] in ED with SAO2 98-99%O2 Admitted for treatment of Acute repiratory failure and UTI  PMH  CVA (with L-sided weakness), alcohol abuse, prior tobacco and cocaine use, CAD, CHF, COPD, DM II, DVT, OSA on CPAP, PVD, sacral wound with osteomylitis and seizures.   OT comments  Pt received pleasant and agreeable to OT session prior to DC order to SNF rehab. Pt engaged in RUE AROM exercises in various plans to improve joint mobility as well as Rolling toward L side. Max A +2 required for rolling due to weakness. Pt is able to reach towards grab bar. No c/o of SOB, pt on RA upon arrival.  Session limited due to MD's meeting with patient, however, pt will benefit from continued skilled OT to address established deficits to maximize independence and to decrease burden for PCA care. Pt ordered for DC to SNF.    Follow Up Recommendations  SNF;Supervision/Assistance - 24 hour    Equipment Recommendations  None recommended by OT    Recommendations for Other Services      Precautions / Restrictions Precautions Precautions: Fall Restrictions Weight Bearing Restrictions: No       Mobility Bed Mobility Overal bed mobility: Needs Assistance Bed Mobility: Rolling Rolling: Total assist;+2 for physical assistance         General bed mobility comments: Pt able to reach toward L side grab bar, however, required max A +2 to complete full roll towards L side to position pillow to relieve pressure.    Transfers Overall transfer level: Needs assistance               General transfer comment: hoyer lift at baseline    Balance Overall balance assessment:  (maximal assist)                                          ADL either performed or assessed with clinical judgement   ADL Overall ADL's : Needs assistance/impaired                                       General ADL Comments: session limited and focused on joint mobility/bed mobilty with positioning.     Vision   Vision Assessment?: No apparent visual deficits   Perception     Praxis      Cognition Arousal/Alertness: Awake/alert Behavior During Therapy: WFL for tasks assessed/performed Overall Cognitive Status: History of cognitive impairments - at baseline Area of Impairment: Memory;Awareness;Problem solving;Following commands                     Memory: Decreased short-term memory (poor historian) Following Commands: Follows one step commands with increased time   Awareness: Emergent Problem Solving: Slow processing;Decreased initiation;Requires verbal cues General Comments: pt with baseline cognitive impairment, pleasant, making needs known and following commands without difficulty        Exercises Exercises: General Upper Extremity General Exercises - Upper Extremity Shoulder Flexion: AROM;Right;15 reps;Supine Shoulder Extension: AROM;Right;15 reps;Supine Shoulder Horizontal ABduction: AROM;Right;15  reps;Supine   Shoulder Instructions       General Comments VSS on RA    Pertinent Vitals/ Pain       Pain Assessment: Faces Faces Pain Scale: Hurts little more Pain Location: lower back buttocks Pain Descriptors / Indicators: Grimacing;Guarding Pain Intervention(s): Repositioned (pillow provided to lie on L side.)  Home Living                                          Prior Functioning/Environment              Frequency  Min 2X/week        Progress Toward Goals  OT Goals(current goals can now be found in the care plan section)  Progress towards OT goals: Not progressing toward goals - comment (still required max A  complete rolling due to RUE weakness.)  Acute Rehab OT Goals Patient Stated Goal: To get to recliner. OT Goal Formulation: With patient Time For Goal Achievement: 07/14/20 Potential to Achieve Goals: Fair ADL Goals Pt Will Perform Eating: with set-up;sitting Pt Will Perform Grooming: with set-up;sitting;with adaptive equipment Additional ADL Goal #1: Patient will roll to L in supine with Min A to decrease caregiver burden in prep for bed level ADLs. Additional ADL Goal #2: Patient will tolerate L resting hand splint for 8 hours without signs/symptoms of skin breakdown to decrease risk of further contractures and maintain skin integrity.  Plan Discharge plan remains appropriate    Co-evaluation                 AM-PAC OT "6 Clicks" Daily Activity     Outcome Measure   Help from another person eating meals?: A Little Help from another person taking care of personal grooming?: A Lot Help from another person toileting, which includes using toliet, bedpan, or urinal?: Total Help from another person bathing (including washing, rinsing, drying)?: Total Help from another person to put on and taking off regular upper body clothing?: Total Help from another person to put on and taking off regular lower body clothing?: Total 6 Click Score: 9    End of Session    OT Visit Diagnosis: Muscle weakness (generalized) (M62.81);Hemiplegia and hemiparesis;Pain Pain - Right/Left: Left Pain - part of body: Knee;Leg   Activity Tolerance Patient tolerated treatment well   Patient Left in bed;with call bell/phone within reach;with bed alarm set;Other (comment) (left with MD's)   Nurse Communication Need for lift equipment;Other (comment) (Hoyer lift +2)        Time: 1300-1308 OT Time Calculation (min): 8 min  Charges: OT General Charges $OT Visit: 1 Visit OT Treatments $Therapeutic Exercise: 8-22 mins  Minus Breeding, MSOT, OTR/L  Supplemental Rehabilitation  Services  [phone]    Marius Ditch 07/09/2020, 2:13 PM

## 2020-07-09 NOTE — Care Management Important Message (Signed)
Important Message  Patient Details  Name: [name] MRN: [id number] Date of Birth: 01-27-1951   Medicare Important Message Given:  Yes - Important Message mailed due to current National Emergency  Verbal consent obtained due to current National Emergency  Relationship to patient: Self Contact Name: [name] Call Date: 07/09/20  Time: 9741 Phone: [phone] Outcome: No Answer/Busy Important Message mailed to: Patient address on file    [name] 07/09/2020, 1:31 PM

## 2020-07-09 NOTE — TOC Transition Note (Signed)
Transition of Care [facility]) - CM/SW Discharge Note   Patient Details  Name: [name] MRN: [id number] Date of Birth: [birth year]  Transition [facility] Care [facility]) CM/SW Contact:  Benard Hal[place], [place] Phone Number: 07/09/2020, 3:25 PM   Clinical Narrative:    Patient will DC to: [facility] Anticipated DC date: 07/09/20 Family notified: Left vm for caregiver, [name], as requested by patient Transport by: Corey Harold   Per MD patient ready for DC to [facility]. RN to call report prior to discharge [phone] room 414-2). RN, patient, patient's family, and facility notified of DC. Discharge Summary and FL2 sent to facility. DC packet on chart. Ambulance transport requested for patient.   CSW will sign off for now as social work intervention is no longer needed. Please consult [place] again if new needs arise.      Final next level of care: Skilled Nursing Facility Barriers to Discharge: Barriers Resolved   Patient Goals and CMS Choice Patient states their goals for this hospitalization and ongoing recovery are:: SNF CMS Medicare.gov Compare Post Acute Care list provided to:: Patient Choice offered to / list presented to : Patient (caregiver, [name])  Discharge Placement   Existing PASRR number confirmed : 07/09/20          Patient chooses bed at: [facility] Patient to be transferred to facility by: [place] Name of family member notified: Caregiver, [name] Patient and family notified of of transfer: 07/09/20  Discharge Plan and Services In-house Referral: Clinical Social Work   Post Acute Care Choice: [place]                               Social Determinants of Health (SDOH) Interventions Housing Interventions: Intervention Not Indicated   Readmission Risk Interventions Readmission Risk Prevention Plan 07/09/2020 08/23/2018  Transportation Screening Complete Complete  PCP or Specialist Appt within 3-5 Days - Complete  HRI or [place] - Not  Complete  Palliative Care Screening - Complete  Medication Review (RN Care Manager) Referral to Pharmacy Referral to Pharmacy  PCP or Specialist appointment within 3-5 days of discharge Complete -  [place] or Home Care Consult Complete -  SW Recovery Care/Counseling Consult Complete -  Palliative Care Screening Not Applicable -  Skilled Nursing Facility Complete -  Some recent data might be hidden

## 2020-07-09 NOTE — Progress Notes (Signed)
Report called to Roselyn Reef at [facility]

## 2020-07-09 NOTE — TOC Progression Note (Addendum)
Transition of Care [facility]) - Progression Note    Patient Details  Name: HIRAN LEARD MRN: [id number] Date of Birth: Jan 09, 1951  Transition of Care [facility]) CM/SW [place], LCSW Phone Number: 07/09/2020, 9:20 AM  Clinical Narrative:    [place] received denial for insurance authorization. CSW submitted Letter of Guarantee for one day at the [facility] rate (patient's Medicaid check anticipated to come in tomorrow, 6/1). COVID test from 5/29 still effective and patient can discharge today once Letter of Guarantee is faxed over by Admin.   1:34pm-CSW confirmed SNF received LOG paperwork. CSW left voicemail for patient's caregiver, [name].    Expected Discharge Plan: [place] Barriers to Discharge: Continued Medical Work up,Insurance Authorization,SNF Pending bed offer  Expected Discharge Plan and Services Expected Discharge Plan: [place] In-house Referral: Clinical Social Work   Post Acute Care Choice: [place] Living arrangements for the past 2 months: [place]                                       Social Determinants of Health (SDOH) Interventions Housing Interventions: Intervention Not Indicated  Readmission Risk Interventions Readmission Risk Prevention Plan 08/23/2018  Transportation Screening Complete  PCP or Specialist Appt within 3-5 Days Complete  HRI or [place] Not Complete  Palliative Care Screening Complete  Medication Review (RN Care Manager) Referral to Pharmacy  Some recent data might be hidden

## 2020-07-10 ENCOUNTER — Ambulatory Visit: Payer: Medicare Other | Admitting: Gastroenterology

## 2020-07-10 DIAGNOSIS — M6281 Muscle weakness (generalized): Secondary | ICD-10-CM | POA: Diagnosis not present

## 2020-07-10 DIAGNOSIS — E1140 Type 2 diabetes mellitus with diabetic neuropathy, unspecified: Secondary | ICD-10-CM | POA: Diagnosis not present

## 2020-07-10 DIAGNOSIS — F32A Depression, unspecified: Secondary | ICD-10-CM | POA: Diagnosis not present

## 2020-07-10 DIAGNOSIS — E785 Hyperlipidemia, unspecified: Secondary | ICD-10-CM | POA: Diagnosis not present

## 2020-07-10 DIAGNOSIS — Z79899 Other long term (current) drug therapy: Secondary | ICD-10-CM | POA: Diagnosis not present

## 2020-07-10 DIAGNOSIS — I69391 Dysphagia following cerebral infarction: Secondary | ICD-10-CM | POA: Diagnosis not present

## 2020-07-10 DIAGNOSIS — K5900 Constipation, unspecified: Secondary | ICD-10-CM | POA: Diagnosis not present

## 2020-07-10 DIAGNOSIS — K219 Gastro-esophageal reflux disease without esophagitis: Secondary | ICD-10-CM | POA: Diagnosis not present

## 2020-07-10 DIAGNOSIS — R52 Pain, unspecified: Secondary | ICD-10-CM | POA: Diagnosis not present

## 2020-07-10 DIAGNOSIS — R278 Other lack of coordination: Secondary | ICD-10-CM | POA: Diagnosis not present

## 2020-07-10 DIAGNOSIS — J9600 Acute respiratory failure, unspecified whether with hypoxia or hypercapnia: Secondary | ICD-10-CM | POA: Diagnosis not present

## 2020-07-10 DIAGNOSIS — E1149 Type 2 diabetes mellitus with other diabetic neurological complication: Secondary | ICD-10-CM | POA: Diagnosis not present

## 2020-07-10 NOTE — Progress Notes (Signed)
Internal Medicine Clinic Attending  Case discussed with Dr. Alfonse Spruce  At the time of the visit.  We reviewed the resident's history .  I agree with the assessment, diagnosis, and plan of care documented in the resident's note.

## 2020-07-10 NOTE — Addendum Note (Signed)
Addended by: Hulan Fray on: 07/10/2020 02:38 PM   Modules accepted: Orders

## 2020-07-11 ENCOUNTER — Telehealth: Payer: Medicare Other | Admitting: Licensed Clinical Social Worker

## 2020-07-11 ENCOUNTER — Telehealth: Payer: Self-pay | Admitting: Licensed Clinical Social Worker

## 2020-07-11 DIAGNOSIS — S31000D Unspecified open wound of lower back and pelvis without penetration into retroperitoneum, subsequent encounter: Secondary | ICD-10-CM | POA: Diagnosis not present

## 2020-07-11 DIAGNOSIS — N390 Urinary tract infection, site not specified: Secondary | ICD-10-CM | POA: Diagnosis not present

## 2020-07-11 DIAGNOSIS — K5900 Constipation, unspecified: Secondary | ICD-10-CM | POA: Diagnosis not present

## 2020-07-11 DIAGNOSIS — I69391 Dysphagia following cerebral infarction: Secondary | ICD-10-CM | POA: Diagnosis not present

## 2020-07-11 DIAGNOSIS — F32A Depression, unspecified: Secondary | ICD-10-CM | POA: Diagnosis not present

## 2020-07-11 DIAGNOSIS — J9600 Acute respiratory failure, unspecified whether with hypoxia or hypercapnia: Secondary | ICD-10-CM | POA: Diagnosis not present

## 2020-07-11 DIAGNOSIS — K567 Ileus, unspecified: Secondary | ICD-10-CM | POA: Diagnosis not present

## 2020-07-11 DIAGNOSIS — R278 Other lack of coordination: Secondary | ICD-10-CM | POA: Diagnosis not present

## 2020-07-11 DIAGNOSIS — J9620 Acute and chronic respiratory failure, unspecified whether with hypoxia or hypercapnia: Secondary | ICD-10-CM | POA: Diagnosis not present

## 2020-07-11 DIAGNOSIS — M6281 Muscle weakness (generalized): Secondary | ICD-10-CM | POA: Diagnosis not present

## 2020-07-11 DIAGNOSIS — M25519 Pain in unspecified shoulder: Secondary | ICD-10-CM | POA: Diagnosis not present

## 2020-07-11 DIAGNOSIS — J449 Chronic obstructive pulmonary disease, unspecified: Secondary | ICD-10-CM | POA: Diagnosis not present

## 2020-07-11 NOTE — Telephone Encounter (Signed)
  Care Management   Follow Up Note   07/11/2020 Name: [name] MRN: [id number] DOB: September 10, 1950   Referred by: Gaylan Gerold, DO Reason for referral : No chief complaint on file.   An unsuccessful telephone outreach was attempted today. The patient was referred to the case management team for assistance with care management and care coordination.   Follow Up Plan: Telephone follow up appointment with care management team member scheduled for: Next 30-days.  Milus Height, [place]  Social Worker [facility] Care Management  [phone]

## 2020-07-12 DIAGNOSIS — J9600 Acute respiratory failure, unspecified whether with hypoxia or hypercapnia: Secondary | ICD-10-CM | POA: Diagnosis not present

## 2020-07-12 DIAGNOSIS — I69391 Dysphagia following cerebral infarction: Secondary | ICD-10-CM | POA: Diagnosis not present

## 2020-07-12 DIAGNOSIS — R278 Other lack of coordination: Secondary | ICD-10-CM | POA: Diagnosis not present

## 2020-07-12 DIAGNOSIS — L89154 Pressure ulcer of sacral region, stage 4: Secondary | ICD-10-CM | POA: Diagnosis not present

## 2020-07-12 DIAGNOSIS — M6281 Muscle weakness (generalized): Secondary | ICD-10-CM | POA: Diagnosis not present

## 2020-07-13 DIAGNOSIS — R278 Other lack of coordination: Secondary | ICD-10-CM | POA: Diagnosis not present

## 2020-07-13 DIAGNOSIS — J9600 Acute respiratory failure, unspecified whether with hypoxia or hypercapnia: Secondary | ICD-10-CM | POA: Diagnosis not present

## 2020-07-13 DIAGNOSIS — M6281 Muscle weakness (generalized): Secondary | ICD-10-CM | POA: Diagnosis not present

## 2020-07-13 DIAGNOSIS — I69391 Dysphagia following cerebral infarction: Secondary | ICD-10-CM | POA: Diagnosis not present

## 2020-07-15 DIAGNOSIS — I69391 Dysphagia following cerebral infarction: Secondary | ICD-10-CM | POA: Diagnosis not present

## 2020-07-15 DIAGNOSIS — M6281 Muscle weakness (generalized): Secondary | ICD-10-CM | POA: Diagnosis not present

## 2020-07-15 DIAGNOSIS — R278 Other lack of coordination: Secondary | ICD-10-CM | POA: Diagnosis not present

## 2020-07-15 DIAGNOSIS — J9600 Acute respiratory failure, unspecified whether with hypoxia or hypercapnia: Secondary | ICD-10-CM | POA: Diagnosis not present

## 2020-07-16 DIAGNOSIS — M6281 Muscle weakness (generalized): Secondary | ICD-10-CM | POA: Diagnosis not present

## 2020-07-16 DIAGNOSIS — R278 Other lack of coordination: Secondary | ICD-10-CM | POA: Diagnosis not present

## 2020-07-16 DIAGNOSIS — J9600 Acute respiratory failure, unspecified whether with hypoxia or hypercapnia: Secondary | ICD-10-CM | POA: Diagnosis not present

## 2020-07-16 DIAGNOSIS — I69391 Dysphagia following cerebral infarction: Secondary | ICD-10-CM | POA: Diagnosis not present

## 2020-07-17 DIAGNOSIS — J9600 Acute respiratory failure, unspecified whether with hypoxia or hypercapnia: Secondary | ICD-10-CM | POA: Diagnosis not present

## 2020-07-17 DIAGNOSIS — I69391 Dysphagia following cerebral infarction: Secondary | ICD-10-CM | POA: Diagnosis not present

## 2020-07-17 DIAGNOSIS — L89154 Pressure ulcer of sacral region, stage 4: Secondary | ICD-10-CM | POA: Diagnosis not present

## 2020-07-17 DIAGNOSIS — L22 Diaper dermatitis: Secondary | ICD-10-CM | POA: Diagnosis not present

## 2020-07-17 DIAGNOSIS — R278 Other lack of coordination: Secondary | ICD-10-CM | POA: Diagnosis not present

## 2020-07-17 DIAGNOSIS — M6281 Muscle weakness (generalized): Secondary | ICD-10-CM | POA: Diagnosis not present

## 2020-07-18 ENCOUNTER — Ambulatory Visit: Payer: Medicare Other | Admitting: Licensed Clinical Social Worker

## 2020-07-18 DIAGNOSIS — M6281 Muscle weakness (generalized): Secondary | ICD-10-CM | POA: Diagnosis not present

## 2020-07-18 DIAGNOSIS — E1140 Type 2 diabetes mellitus with diabetic neuropathy, unspecified: Secondary | ICD-10-CM | POA: Diagnosis not present

## 2020-07-18 DIAGNOSIS — I69391 Dysphagia following cerebral infarction: Secondary | ICD-10-CM | POA: Diagnosis not present

## 2020-07-18 DIAGNOSIS — J9600 Acute respiratory failure, unspecified whether with hypoxia or hypercapnia: Secondary | ICD-10-CM | POA: Diagnosis not present

## 2020-07-18 DIAGNOSIS — R278 Other lack of coordination: Secondary | ICD-10-CM | POA: Diagnosis not present

## 2020-07-18 IMAGING — US US ABDOMEN COMPLETE
1 series · 14 of 25 positions shown · non-contrast
Comparison: None.

CLINICAL DATA: Recurrent abdominal pain. Recent abdominal pain.
Cholecystectomy.

EXAM:
ABDOMEN ULTRASOUND COMPLETE

[Series 1: us abdomen complete · 0.25mm/px · 14 of 62 slices shown]
[im 1/62]
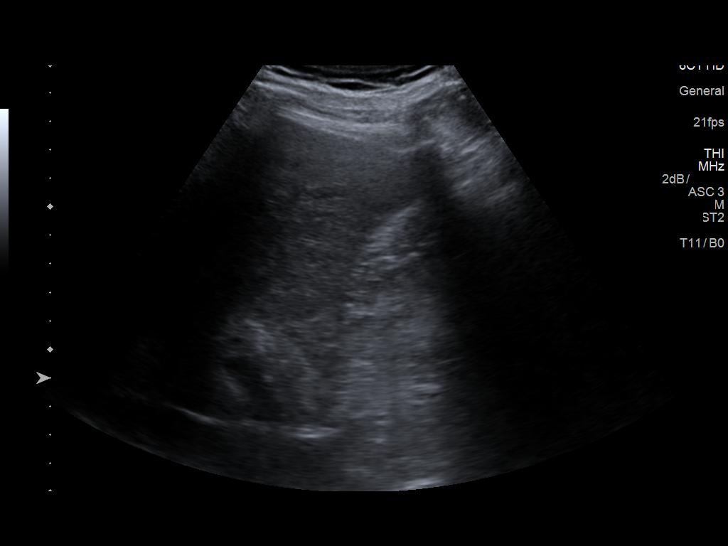
[im 6/62]
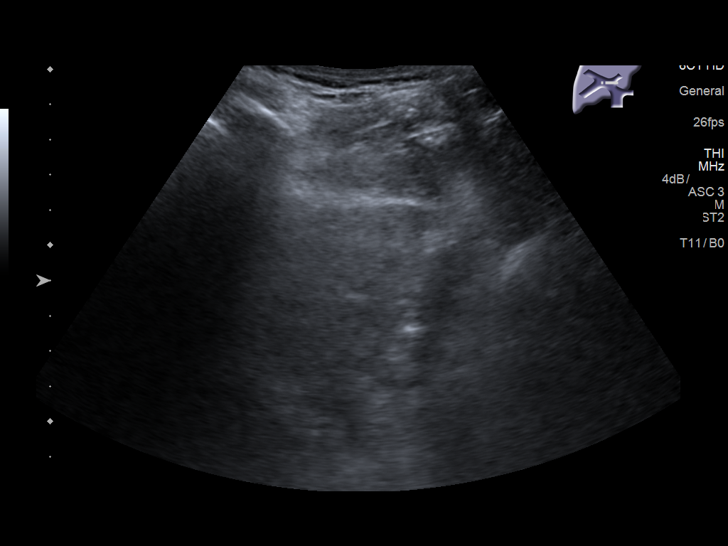
[im 11/62]
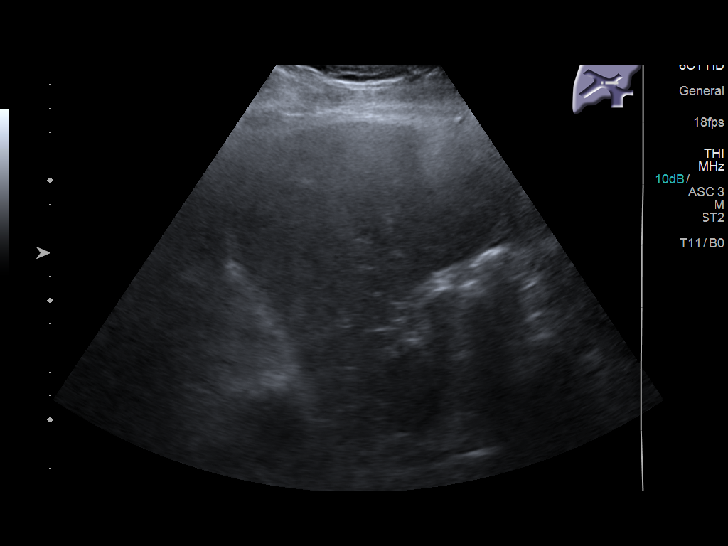
[im 16/62]
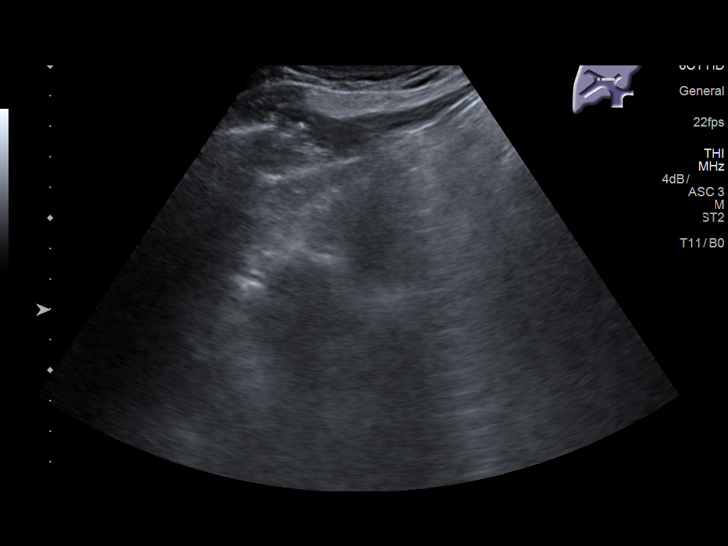
[im 21/62]
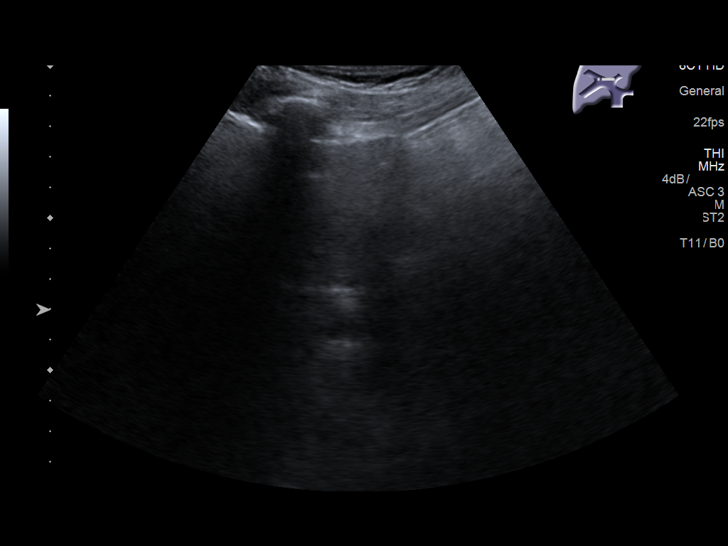
[im 23/62]
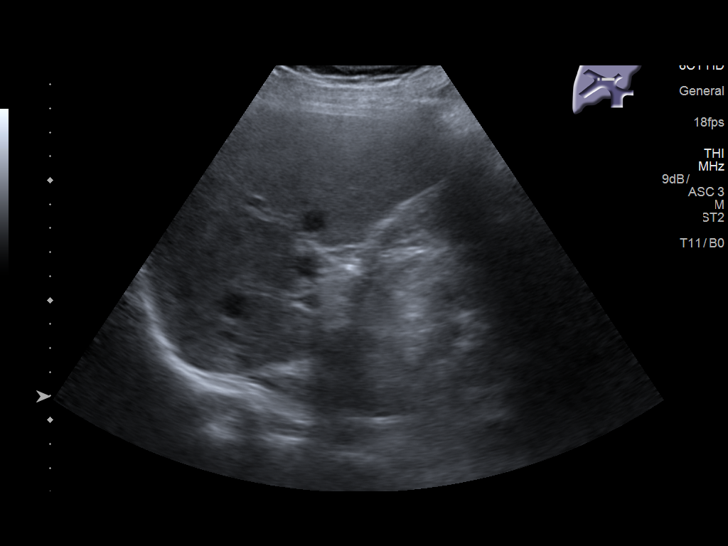
[im 28/62]
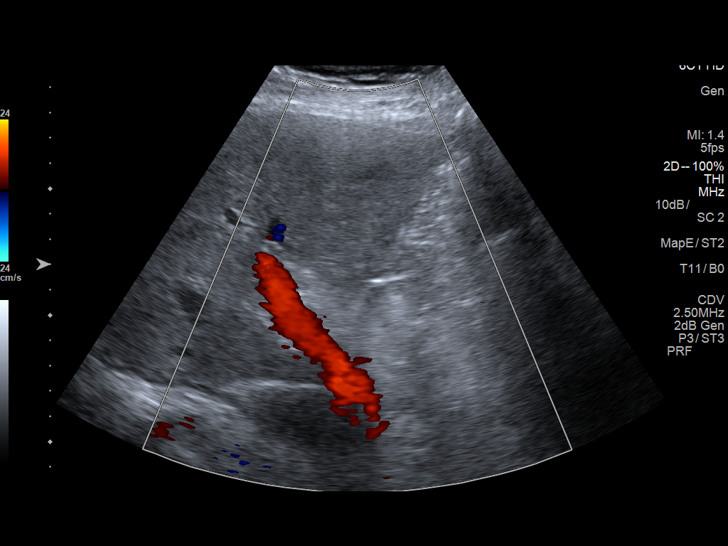
[im 34/62]
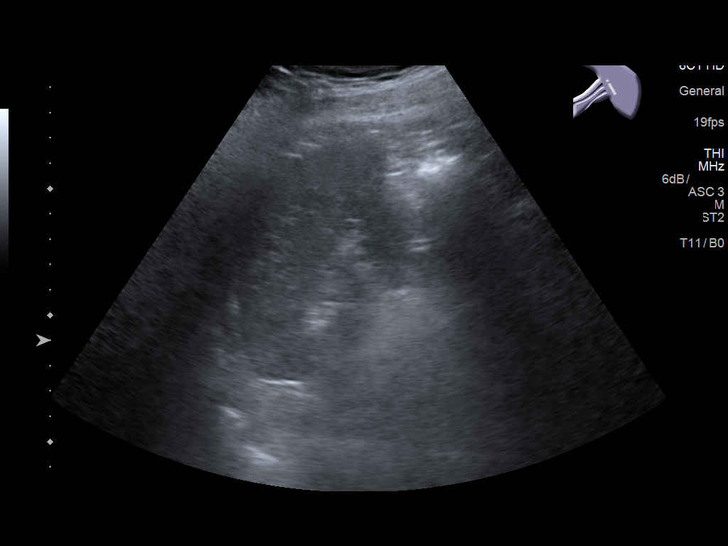
[im 39/62]
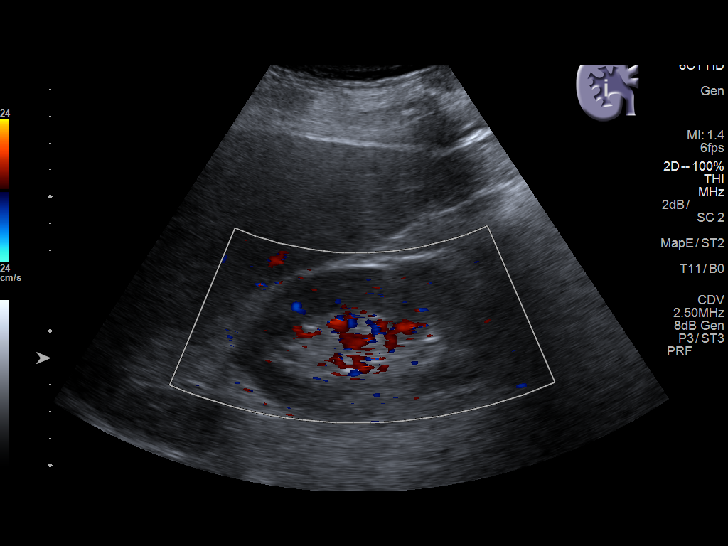
[im 41/62]
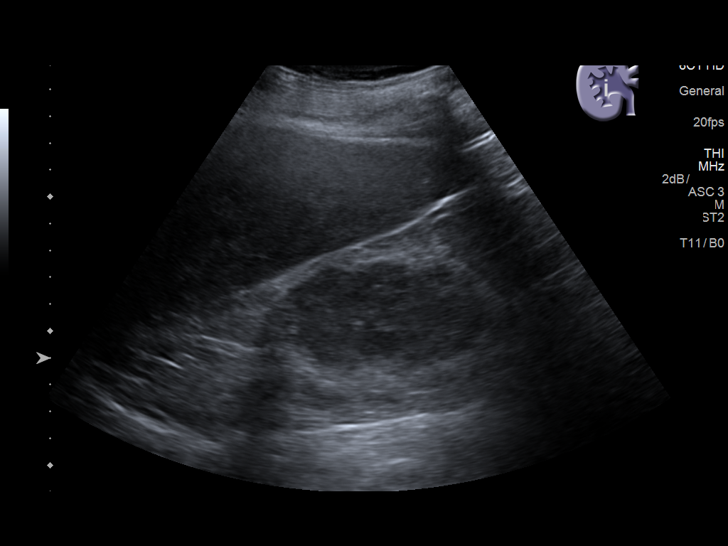
[im 46/62]
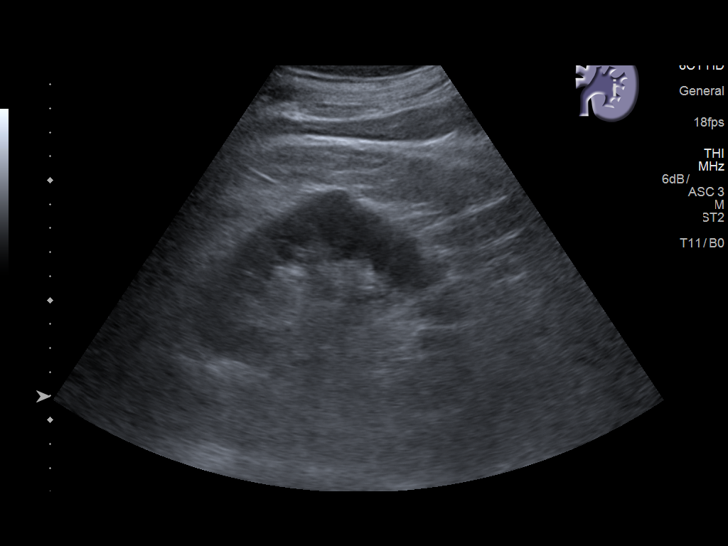
[im 51/62]
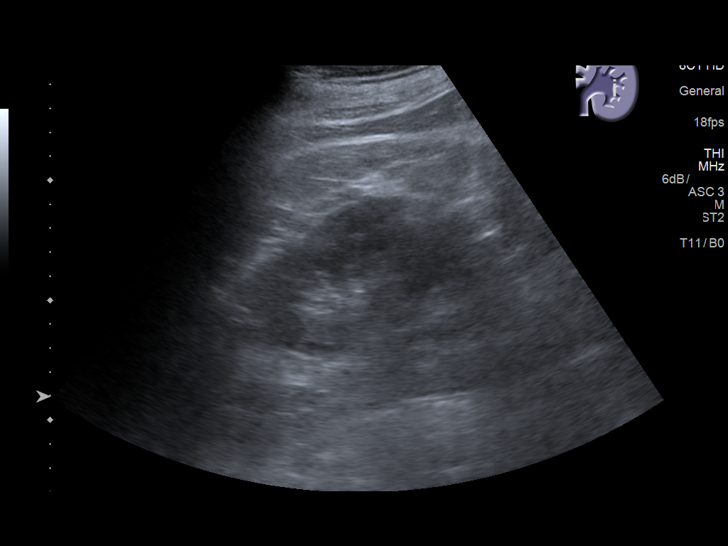
[im 56/62]
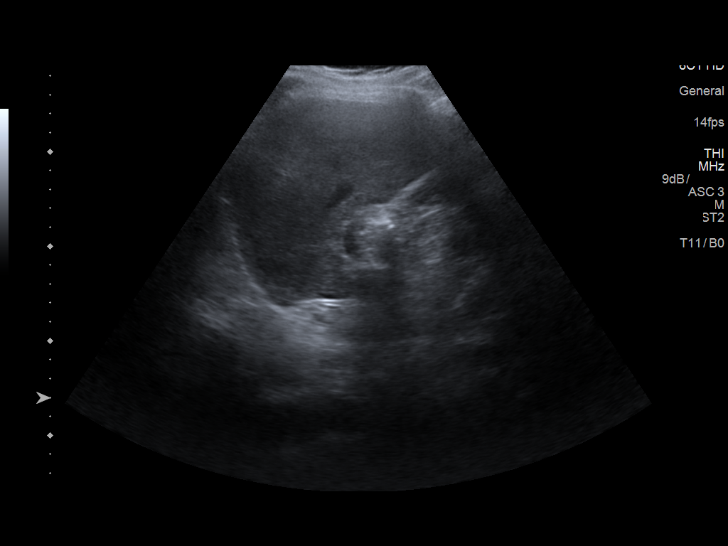
[im 62/62]
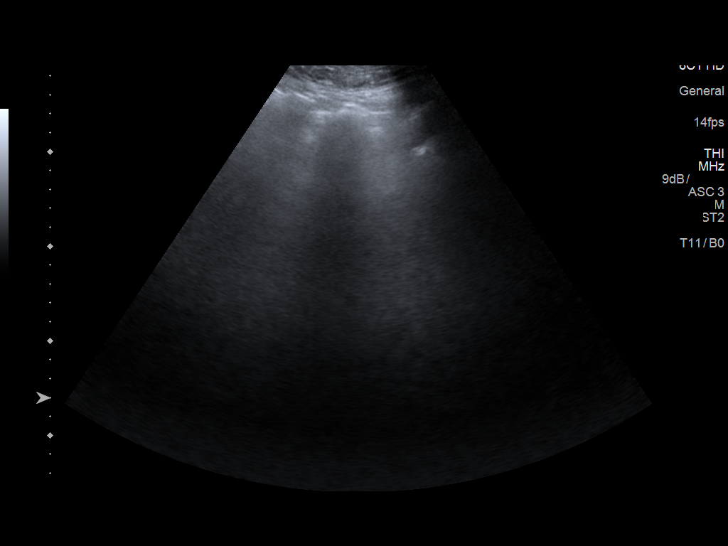

[14 of 25 positions shown; findings below may reference images not displayed]

FINDINGS: Gallbladder: Surgically absent.

Common bile duct: Diameter:

Liver: Increased echogenicity in the liver with no focal mass.
Portal vein is patent on color Doppler imaging with normal direction
of blood flow towards the liver.

IVC: No abnormality visualized.

Pancreas: Not visualized due to shadowing bowel gas.

Spleen: Size and appearance within normal limits.

Right Kidney: Length: 10.5 cm. Echogenicity within normal limits. No
mass or hydronephrosis visualized.

Left Kidney: Length: 12.5 cm. Echogenicity within normal limits. No
mass or hydronephrosis visualized.

Abdominal aorta: Measures 3.2 cm proximally.

Other findings: None.
IMPRESSION: 1. Mild aneurysmal dilatation of the abdominal aorta, measuring
cm proximally. Recommend followup by ultrasound in 3 years. This
recommendation follows ACR consensus guidelines: White Paper of the
ACR Incidental Findings Committee II on Vascular Findings. [HOSPITAL] 2917; [DATE]
2. Probable hepatic steatosis.

## 2020-07-18 NOTE — Chronic Care Management (AMB) (Signed)
  Care Management   Social Work Visit Note  07/18/2020 Name: [name] MRN: [id number] DOB: 04/28/1950  [name] is a 70 y.o. year old male who sees Gaylan Gerold, DO for primary care. The care management team was consulted for assistance with care management and care coordination needs related to Level of Care Concerns   Assessment: Review of patient history, allergies, and health status during evaluation of patient need for care management/care coordination services.    Interventions:  SW reviewed chart on today in preparation for upcoming appointment. SW attempted contact with DSS to verify  FL2 form was received. SW left a message for adult services.     Plan:  SW will follow up with [place], [place] Worker [facility] Care Management  [phone]

## 2020-07-19 DIAGNOSIS — M6281 Muscle weakness (generalized): Secondary | ICD-10-CM | POA: Diagnosis not present

## 2020-07-19 DIAGNOSIS — R278 Other lack of coordination: Secondary | ICD-10-CM | POA: Diagnosis not present

## 2020-07-19 DIAGNOSIS — I69391 Dysphagia following cerebral infarction: Secondary | ICD-10-CM | POA: Diagnosis not present

## 2020-07-19 DIAGNOSIS — J9600 Acute respiratory failure, unspecified whether with hypoxia or hypercapnia: Secondary | ICD-10-CM | POA: Diagnosis not present

## 2020-07-20 DIAGNOSIS — S31000A Unspecified open wound of lower back and pelvis without penetration into retroperitoneum, initial encounter: Secondary | ICD-10-CM | POA: Diagnosis not present

## 2020-07-20 DIAGNOSIS — M4647 Discitis, unspecified, lumbosacral region: Secondary | ICD-10-CM | POA: Diagnosis not present

## 2020-07-20 DIAGNOSIS — G8929 Other chronic pain: Secondary | ICD-10-CM | POA: Diagnosis not present

## 2020-07-20 DIAGNOSIS — I70229 Atherosclerosis of native arteries of extremities with rest pain, unspecified extremity: Secondary | ICD-10-CM | POA: Diagnosis not present

## 2020-07-20 DIAGNOSIS — L8990 Pressure ulcer of unspecified site, unspecified stage: Secondary | ICD-10-CM | POA: Diagnosis not present

## 2020-07-22 ENCOUNTER — Encounter (HOSPITAL_BASED_OUTPATIENT_CLINIC_OR_DEPARTMENT_OTHER): Payer: Medicare Other | Attending: Internal Medicine | Admitting: Internal Medicine

## 2020-07-22 DIAGNOSIS — I69391 Dysphagia following cerebral infarction: Secondary | ICD-10-CM | POA: Diagnosis not present

## 2020-07-22 DIAGNOSIS — M6281 Muscle weakness (generalized): Secondary | ICD-10-CM | POA: Diagnosis not present

## 2020-07-22 DIAGNOSIS — I10 Essential (primary) hypertension: Secondary | ICD-10-CM | POA: Diagnosis not present

## 2020-07-22 DIAGNOSIS — R278 Other lack of coordination: Secondary | ICD-10-CM | POA: Diagnosis not present

## 2020-07-22 DIAGNOSIS — J9600 Acute respiratory failure, unspecified whether with hypoxia or hypercapnia: Secondary | ICD-10-CM | POA: Diagnosis not present

## 2020-07-22 DIAGNOSIS — E119 Type 2 diabetes mellitus without complications: Secondary | ICD-10-CM | POA: Diagnosis not present

## 2020-07-23 DIAGNOSIS — J9600 Acute respiratory failure, unspecified whether with hypoxia or hypercapnia: Secondary | ICD-10-CM | POA: Diagnosis not present

## 2020-07-23 DIAGNOSIS — R278 Other lack of coordination: Secondary | ICD-10-CM | POA: Diagnosis not present

## 2020-07-23 DIAGNOSIS — M6281 Muscle weakness (generalized): Secondary | ICD-10-CM | POA: Diagnosis not present

## 2020-07-23 DIAGNOSIS — I69391 Dysphagia following cerebral infarction: Secondary | ICD-10-CM | POA: Diagnosis not present

## 2020-07-24 DIAGNOSIS — M6281 Muscle weakness (generalized): Secondary | ICD-10-CM | POA: Diagnosis not present

## 2020-07-24 DIAGNOSIS — L89154 Pressure ulcer of sacral region, stage 4: Secondary | ICD-10-CM | POA: Diagnosis not present

## 2020-07-24 DIAGNOSIS — Z872 Personal history of diseases of the skin and subcutaneous tissue: Secondary | ICD-10-CM | POA: Diagnosis not present

## 2020-07-24 DIAGNOSIS — J9600 Acute respiratory failure, unspecified whether with hypoxia or hypercapnia: Secondary | ICD-10-CM | POA: Diagnosis not present

## 2020-07-24 DIAGNOSIS — R278 Other lack of coordination: Secondary | ICD-10-CM | POA: Diagnosis not present

## 2020-07-24 DIAGNOSIS — I69391 Dysphagia following cerebral infarction: Secondary | ICD-10-CM | POA: Diagnosis not present

## 2020-07-25 ENCOUNTER — Ambulatory Visit: Payer: Medicare Other | Admitting: Infectious Disease

## 2020-07-25 DIAGNOSIS — I10 Essential (primary) hypertension: Secondary | ICD-10-CM | POA: Diagnosis not present

## 2020-07-25 DIAGNOSIS — E870 Hyperosmolality and hypernatremia: Secondary | ICD-10-CM | POA: Diagnosis not present

## 2020-07-25 DIAGNOSIS — J9600 Acute respiratory failure, unspecified whether with hypoxia or hypercapnia: Secondary | ICD-10-CM | POA: Diagnosis not present

## 2020-07-25 DIAGNOSIS — D649 Anemia, unspecified: Secondary | ICD-10-CM | POA: Diagnosis not present

## 2020-07-25 DIAGNOSIS — R278 Other lack of coordination: Secondary | ICD-10-CM | POA: Diagnosis not present

## 2020-07-25 DIAGNOSIS — I69391 Dysphagia following cerebral infarction: Secondary | ICD-10-CM | POA: Diagnosis not present

## 2020-07-25 DIAGNOSIS — M6281 Muscle weakness (generalized): Secondary | ICD-10-CM | POA: Diagnosis not present

## 2020-07-26 DIAGNOSIS — J9600 Acute respiratory failure, unspecified whether with hypoxia or hypercapnia: Secondary | ICD-10-CM | POA: Diagnosis not present

## 2020-07-26 DIAGNOSIS — I69391 Dysphagia following cerebral infarction: Secondary | ICD-10-CM | POA: Diagnosis not present

## 2020-07-26 DIAGNOSIS — R278 Other lack of coordination: Secondary | ICD-10-CM | POA: Diagnosis not present

## 2020-07-26 DIAGNOSIS — M6281 Muscle weakness (generalized): Secondary | ICD-10-CM | POA: Diagnosis not present

## 2020-07-29 DIAGNOSIS — M6281 Muscle weakness (generalized): Secondary | ICD-10-CM | POA: Diagnosis not present

## 2020-07-29 DIAGNOSIS — I69391 Dysphagia following cerebral infarction: Secondary | ICD-10-CM | POA: Diagnosis not present

## 2020-07-29 DIAGNOSIS — J9600 Acute respiratory failure, unspecified whether with hypoxia or hypercapnia: Secondary | ICD-10-CM | POA: Diagnosis not present

## 2020-07-29 DIAGNOSIS — R278 Other lack of coordination: Secondary | ICD-10-CM | POA: Diagnosis not present

## 2020-07-30 DIAGNOSIS — I69391 Dysphagia following cerebral infarction: Secondary | ICD-10-CM | POA: Diagnosis not present

## 2020-07-30 DIAGNOSIS — R278 Other lack of coordination: Secondary | ICD-10-CM | POA: Diagnosis not present

## 2020-07-30 DIAGNOSIS — J9600 Acute respiratory failure, unspecified whether with hypoxia or hypercapnia: Secondary | ICD-10-CM | POA: Diagnosis not present

## 2020-07-30 DIAGNOSIS — M6281 Muscle weakness (generalized): Secondary | ICD-10-CM | POA: Diagnosis not present

## 2020-07-30 IMAGING — CT CT BIOPSY
1 of 2 series · 14 of 32 positions shown, 19 images · non-contrast
Comparison: none

CLINICAL DATA: Hypermetabolic right upper lobe solid pulmonary
nodule.

[Series 2: i-spiral 5.0 b40f · axial · 0.78mm/px · z∈[-232,-8]mm · 14 of 72 slices shown, 19 images]
[im 4/72  soft-tissue]
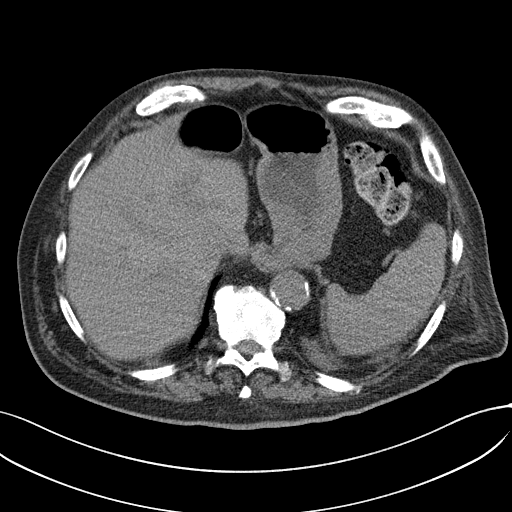
[im 4/72  bone]
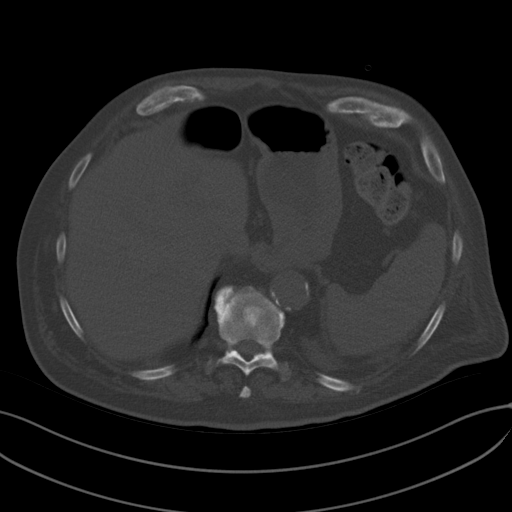
[im 8/72  soft-tissue]
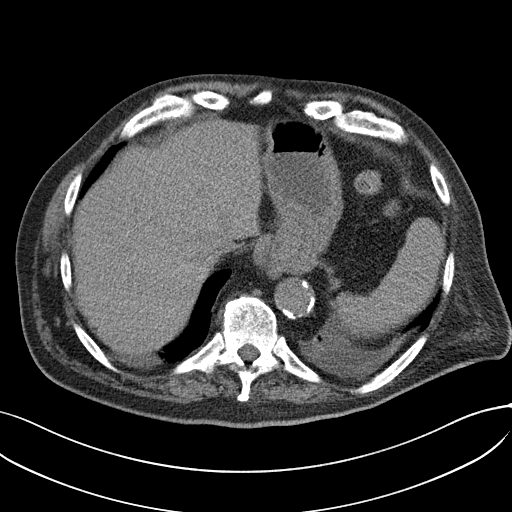
[im 16/72  soft-tissue]
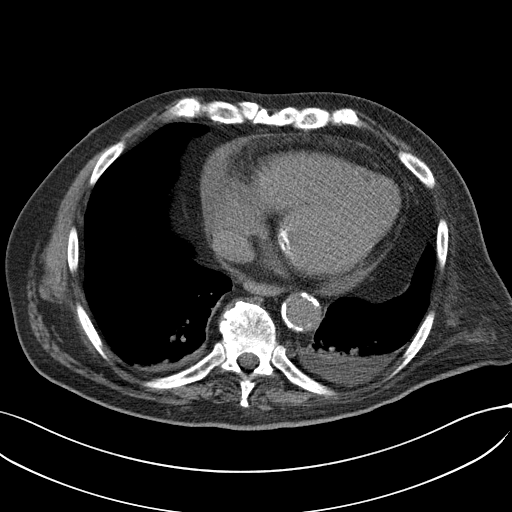
[im 20/72  soft-tissue]
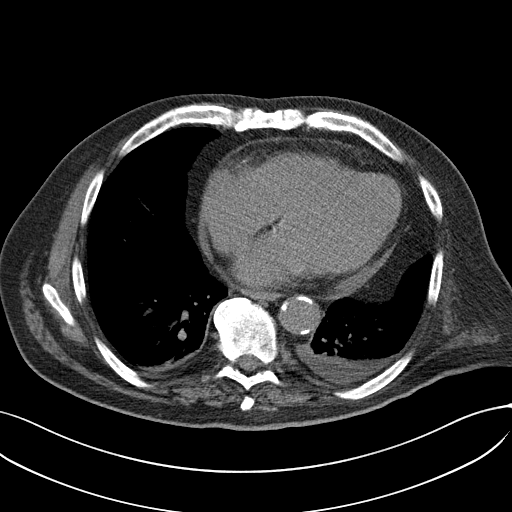
[im 24/72  soft-tissue]
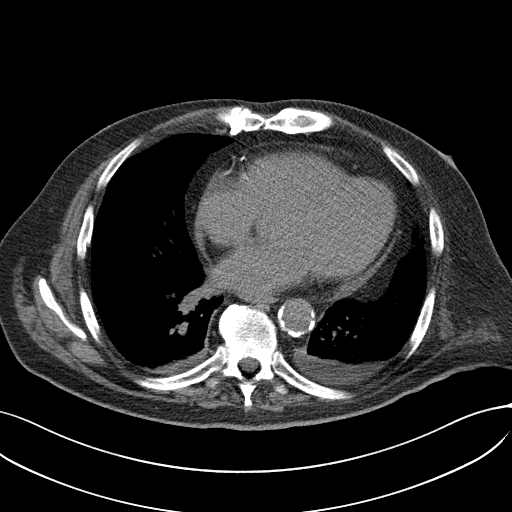
[im 32/72  soft-tissue]
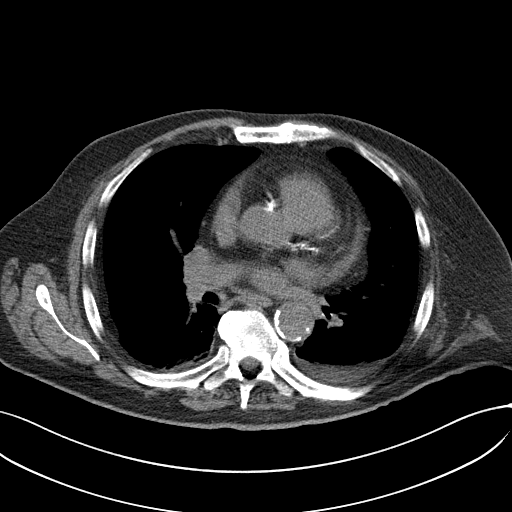
[im 36/72  soft-tissue]
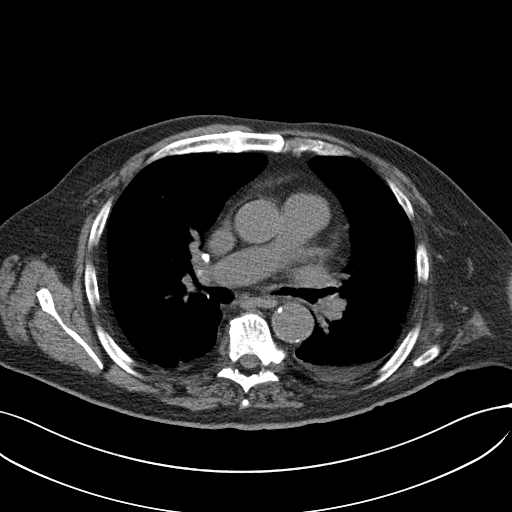
[im 40/72  soft-tissue]
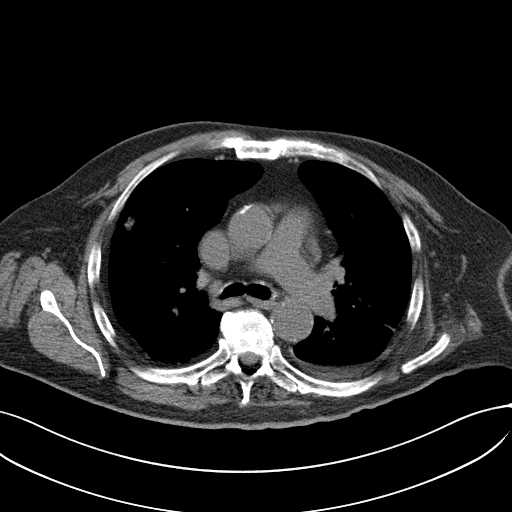
[im 48/72  soft-tissue]
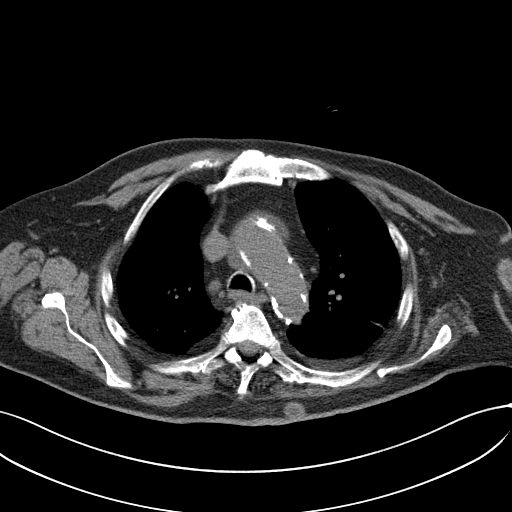
[im 48/72  bone]
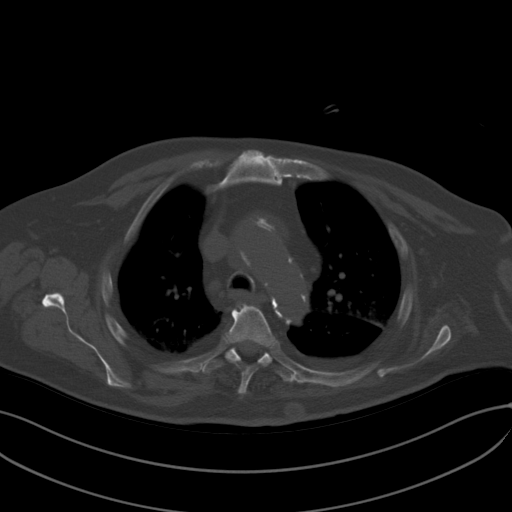
[im 52/72  soft-tissue]
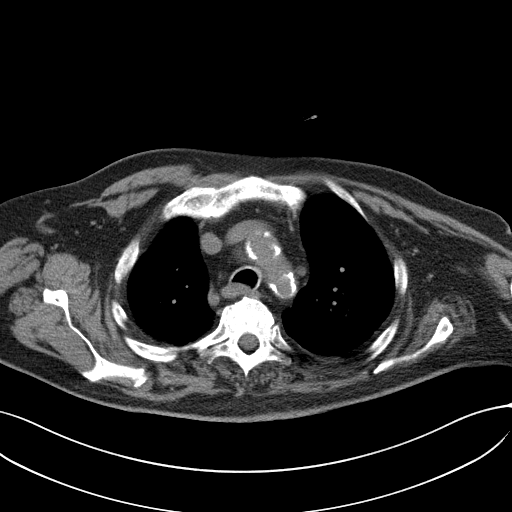
[im 56/72  soft-tissue]
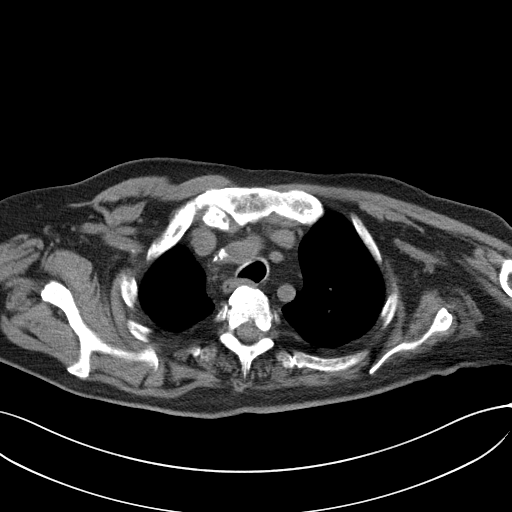
[im 56/72  lung]
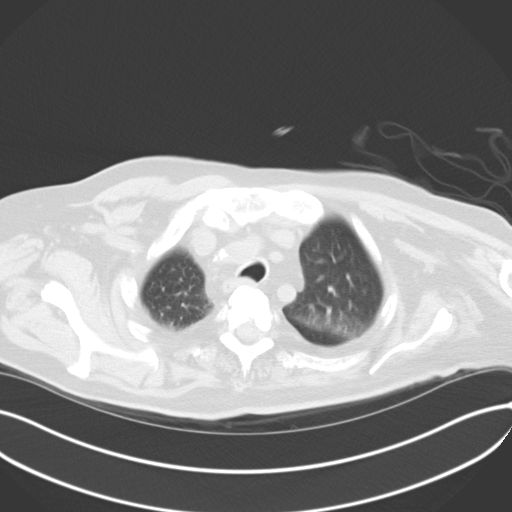
[im 60/72  lung]
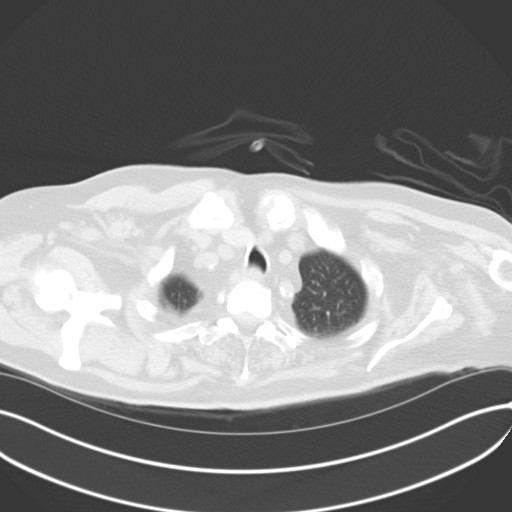
[im 64/72  soft-tissue]
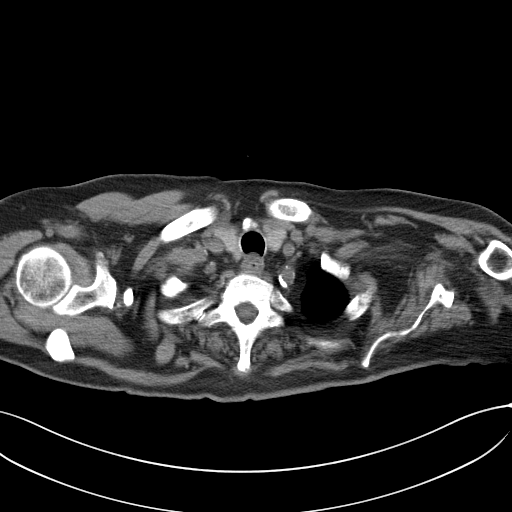
[im 64/72  lung]
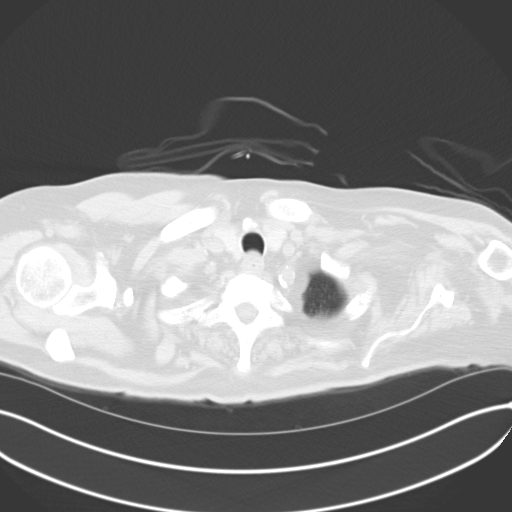
[im 68/72  soft-tissue]
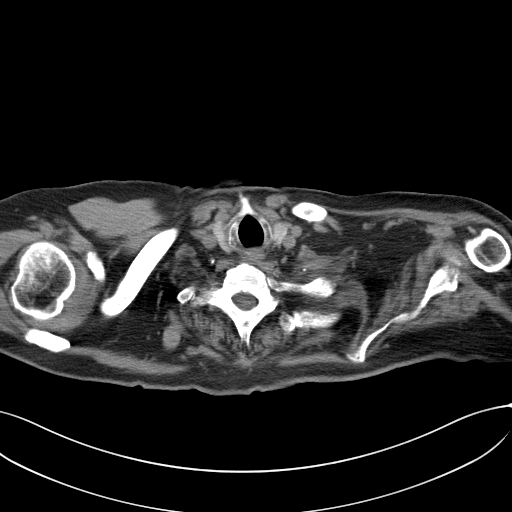
[im 68/72  lung]
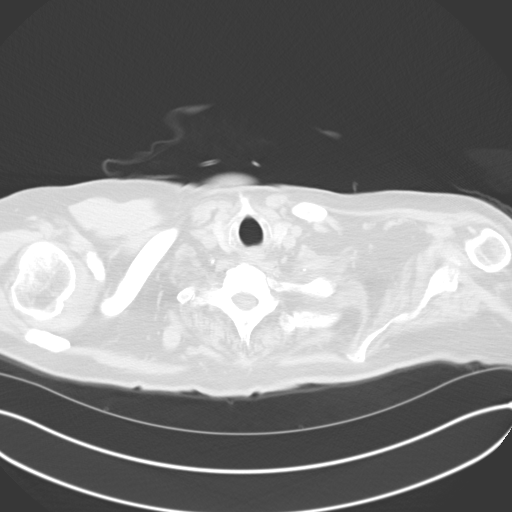

[14 of 32 positions shown; findings below may reference images not displayed]

EXAM:
CT GUIDED CORE BIOPSY OF RIGHT UPPER LOBE LUNG LESION

ANESTHESIA/SEDATION:
Intravenous Fentanyl 94mcg and Versed 1mg were administered as
conscious sedation during continuous monitoring of the patient's
level of consciousness and physiological / cardiorespiratory status
by the radiology RN, with a total moderate sedation time of 17
minutes.

PROCEDURE:
The procedure risks, benefits, and alternatives were explained to
the patient. Questions regarding the procedure were encouraged and
answered. The patient understands and consents to the procedure.

Select axial scans through the thorax obtained. The right upper lobe
lesion was localized and an appropriate skin entry site was
determined and marked.

The operative field was prepped with chlorhexidinein a sterile
fashion, and a sterile drape was applied covering the operative
field. A sterile gown and sterile gloves were used for the
procedure. Local anesthesia was provided with 1% Lidocaine.

Under CT fluoroscopic guidance, a 17 gauge trocar needle was
advanced to the margin of the lesion. Once needle tip position was
confirmed, coaxial 18-gauge core biopsy samples were obtained,
submitted in formalin to surgical pathology. The guide needle was
removed. Postprocedure scans show no pneumothorax. Regional alveolar
hemorrhage. 6 minutes delayed scans showed no significant
pneumothorax.

COMPLICATIONS:
Transient postprocedure hypoxia, treated with O2 administration. SIR
level B: Nominal therapy (including overnight admission for
observation), no consequence.
FINDINGS: The right upper lobe lesion was localized. Representative core
biopsy samples obtained as above.
IMPRESSION: 1. Technically successful CT-guided core biopsy, right upper lobe
lung lesion.
Observation and follow-up chest radiography is scheduled.

## 2020-07-30 IMAGING — DX DG CHEST 1V PORT
1 series · 1 of 1 positions shown · non-contrast
Comparison: Radiograph October 27, 2018.

CLINICAL DATA: Cough, status post right lung biopsy.

EXAM:
PORTABLE CHEST 1 VIEW

[chest ap]
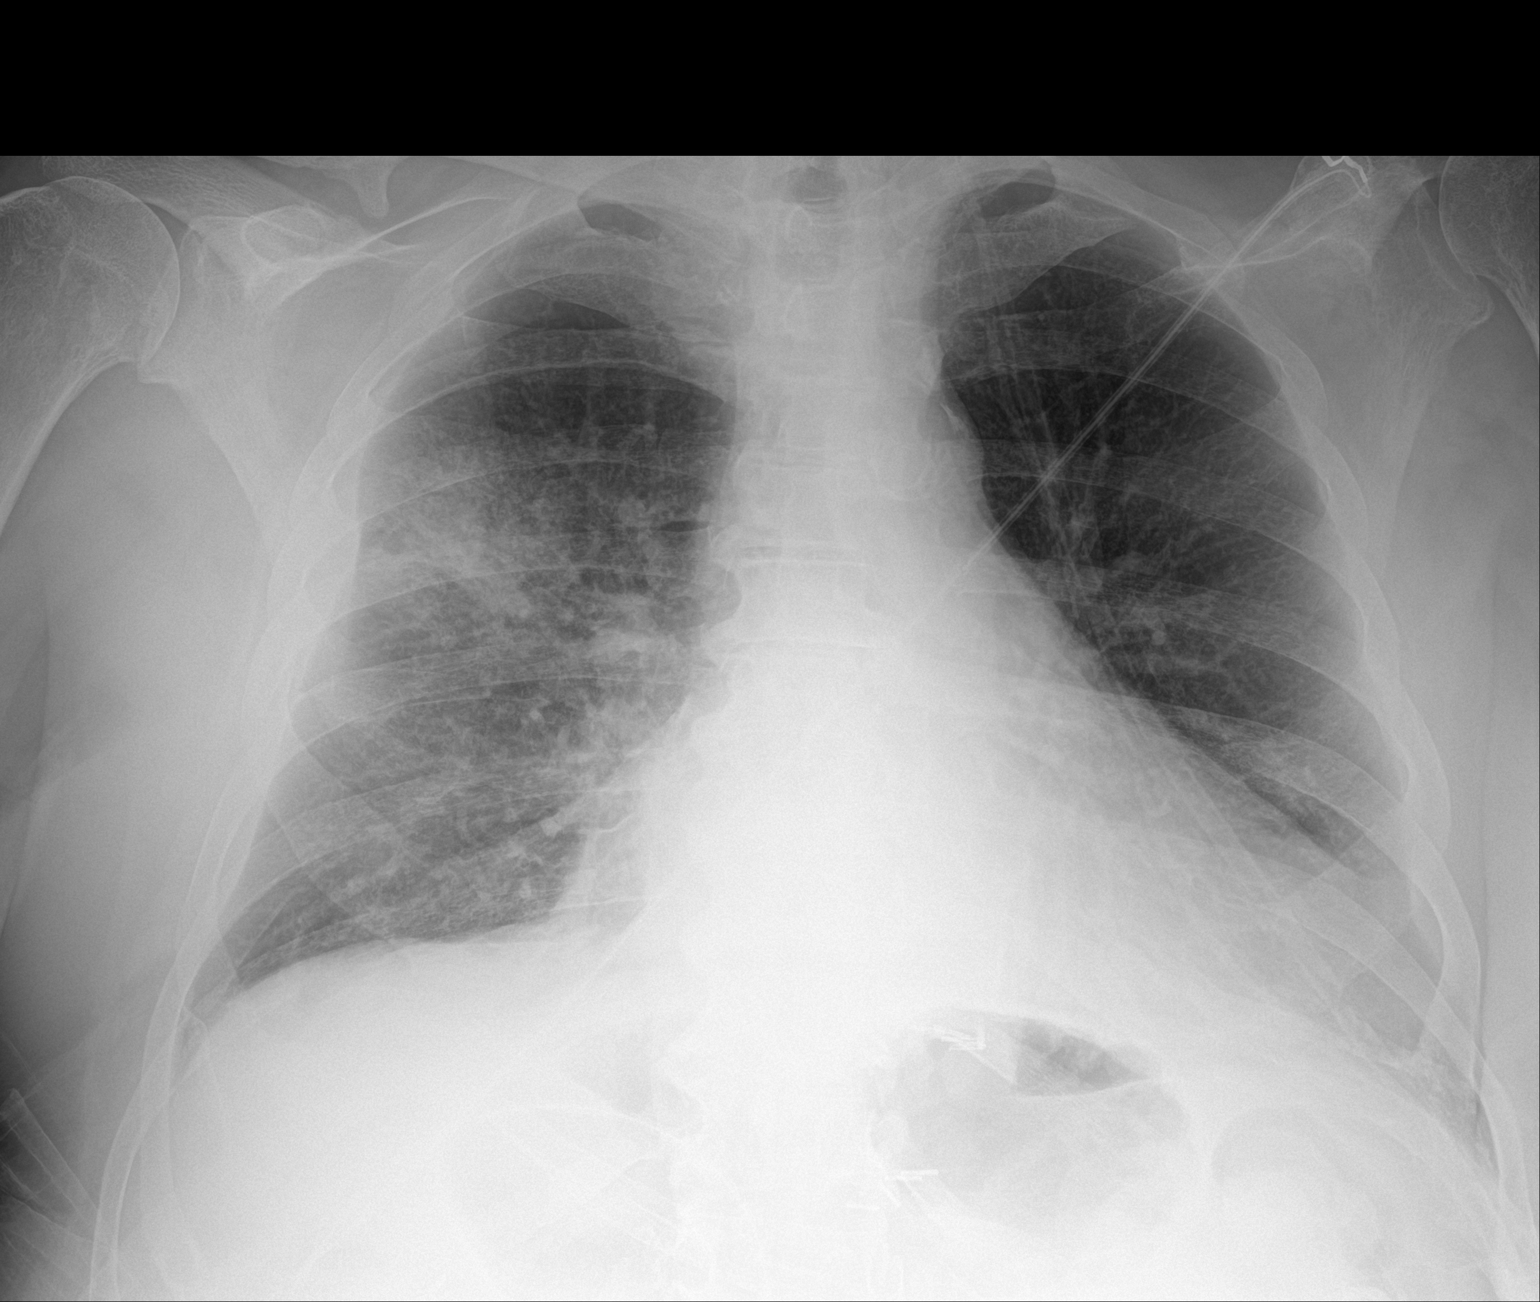

[1 of 1 positions shown; findings below may reference images not displayed]

FINDINGS: Stable cardiomegaly. No pneumothorax is noted. Left lung is clear.
Stable right midlung and basilar opacity is noted concerning for
infiltrate, contusion or possibly atelectasis. No significant
pleural effusion is noted. Bony thorax is unremarkable.
IMPRESSION: Stable right lung opacities as described above.

## 2020-07-31 DIAGNOSIS — R278 Other lack of coordination: Secondary | ICD-10-CM | POA: Diagnosis not present

## 2020-07-31 DIAGNOSIS — I69391 Dysphagia following cerebral infarction: Secondary | ICD-10-CM | POA: Diagnosis not present

## 2020-07-31 DIAGNOSIS — J9600 Acute respiratory failure, unspecified whether with hypoxia or hypercapnia: Secondary | ICD-10-CM | POA: Diagnosis not present

## 2020-07-31 DIAGNOSIS — M6281 Muscle weakness (generalized): Secondary | ICD-10-CM | POA: Diagnosis not present

## 2020-08-01 DIAGNOSIS — M6281 Muscle weakness (generalized): Secondary | ICD-10-CM | POA: Diagnosis not present

## 2020-08-01 DIAGNOSIS — R278 Other lack of coordination: Secondary | ICD-10-CM | POA: Diagnosis not present

## 2020-08-01 DIAGNOSIS — I69391 Dysphagia following cerebral infarction: Secondary | ICD-10-CM | POA: Diagnosis not present

## 2020-08-01 DIAGNOSIS — J9600 Acute respiratory failure, unspecified whether with hypoxia or hypercapnia: Secondary | ICD-10-CM | POA: Diagnosis not present

## 2020-08-02 DIAGNOSIS — H524 Presbyopia: Secondary | ICD-10-CM | POA: Diagnosis not present

## 2020-08-02 DIAGNOSIS — E119 Type 2 diabetes mellitus without complications: Secondary | ICD-10-CM | POA: Diagnosis not present

## 2020-08-02 DIAGNOSIS — M6281 Muscle weakness (generalized): Secondary | ICD-10-CM | POA: Diagnosis not present

## 2020-08-02 DIAGNOSIS — Z961 Presence of intraocular lens: Secondary | ICD-10-CM | POA: Diagnosis not present

## 2020-08-02 DIAGNOSIS — R278 Other lack of coordination: Secondary | ICD-10-CM | POA: Diagnosis not present

## 2020-08-02 DIAGNOSIS — I69391 Dysphagia following cerebral infarction: Secondary | ICD-10-CM | POA: Diagnosis not present

## 2020-08-02 DIAGNOSIS — J9600 Acute respiratory failure, unspecified whether with hypoxia or hypercapnia: Secondary | ICD-10-CM | POA: Diagnosis not present

## 2020-08-05 ENCOUNTER — Ambulatory Visit: Payer: Medicare Other | Admitting: Internal Medicine

## 2020-08-05 DIAGNOSIS — H5213 Myopia, bilateral: Secondary | ICD-10-CM | POA: Diagnosis not present

## 2020-08-05 DIAGNOSIS — M6281 Muscle weakness (generalized): Secondary | ICD-10-CM | POA: Diagnosis not present

## 2020-08-05 DIAGNOSIS — R278 Other lack of coordination: Secondary | ICD-10-CM | POA: Diagnosis not present

## 2020-08-05 DIAGNOSIS — J9600 Acute respiratory failure, unspecified whether with hypoxia or hypercapnia: Secondary | ICD-10-CM | POA: Diagnosis not present

## 2020-08-05 DIAGNOSIS — I69391 Dysphagia following cerebral infarction: Secondary | ICD-10-CM | POA: Diagnosis not present

## 2020-08-06 DIAGNOSIS — M6281 Muscle weakness (generalized): Secondary | ICD-10-CM | POA: Diagnosis not present

## 2020-08-06 DIAGNOSIS — J9600 Acute respiratory failure, unspecified whether with hypoxia or hypercapnia: Secondary | ICD-10-CM | POA: Diagnosis not present

## 2020-08-06 DIAGNOSIS — E1142 Type 2 diabetes mellitus with diabetic polyneuropathy: Secondary | ICD-10-CM | POA: Diagnosis not present

## 2020-08-06 DIAGNOSIS — I69391 Dysphagia following cerebral infarction: Secondary | ICD-10-CM | POA: Diagnosis not present

## 2020-08-06 DIAGNOSIS — E1159 Type 2 diabetes mellitus with other circulatory complications: Secondary | ICD-10-CM | POA: Diagnosis not present

## 2020-08-06 DIAGNOSIS — L603 Nail dystrophy: Secondary | ICD-10-CM | POA: Diagnosis not present

## 2020-08-06 DIAGNOSIS — R278 Other lack of coordination: Secondary | ICD-10-CM | POA: Diagnosis not present

## 2020-08-06 DIAGNOSIS — G8100 Flaccid hemiplegia affecting unspecified side: Secondary | ICD-10-CM | POA: Diagnosis not present

## 2020-08-07 ENCOUNTER — Encounter: Payer: Self-pay | Admitting: *Deleted

## 2020-08-07 DIAGNOSIS — L89154 Pressure ulcer of sacral region, stage 4: Secondary | ICD-10-CM | POA: Diagnosis not present

## 2020-08-07 DIAGNOSIS — R278 Other lack of coordination: Secondary | ICD-10-CM | POA: Diagnosis not present

## 2020-08-07 DIAGNOSIS — J9600 Acute respiratory failure, unspecified whether with hypoxia or hypercapnia: Secondary | ICD-10-CM | POA: Diagnosis not present

## 2020-08-07 DIAGNOSIS — M6281 Muscle weakness (generalized): Secondary | ICD-10-CM | POA: Diagnosis not present

## 2020-08-07 DIAGNOSIS — I69391 Dysphagia following cerebral infarction: Secondary | ICD-10-CM | POA: Diagnosis not present

## 2020-08-07 NOTE — Progress Notes (Unsigned)

## 2020-08-08 DIAGNOSIS — L89154 Pressure ulcer of sacral region, stage 4: Secondary | ICD-10-CM | POA: Diagnosis not present

## 2020-08-08 DIAGNOSIS — I69391 Dysphagia following cerebral infarction: Secondary | ICD-10-CM | POA: Diagnosis not present

## 2020-08-08 DIAGNOSIS — M6281 Muscle weakness (generalized): Secondary | ICD-10-CM | POA: Diagnosis not present

## 2020-08-08 DIAGNOSIS — R278 Other lack of coordination: Secondary | ICD-10-CM | POA: Diagnosis not present

## 2020-08-08 DIAGNOSIS — J9600 Acute respiratory failure, unspecified whether with hypoxia or hypercapnia: Secondary | ICD-10-CM | POA: Diagnosis not present

## 2020-08-08 NOTE — Progress Notes (Unsigned)
Things That May Be Affecting Your Health: x Alcohol  Hearing loss x Pain   x Depression  Home Safety  Sexual Health   Diabetes  Lack of physical activity  Stress   Difficulty with daily activities  Loneliness  Tiredness  x Drug use  Medicines x Tobacco use   Falls  Motor Vehicle Safety x Weight   Food choices  Oral Health  Other    YOUR PERSONALIZED HEALTH PLAN : 1. Schedule your next subsequent Medicare Wellness visit in one year 2. Attend all of your regular appointments to address your medical issues 3. Complete the preventative screenings and services   Annual Wellness Visit   Medicare Covered Preventative Screenings and [place] Men and Women Who How Often Need? Date of Last Service Action  Abdominal Aortic Aneurysm Adults with AAA risk factors Once x     Alcohol Misuse and Counseling All Adults Screening once a year if no alcohol misuse. Counseling up to 4 face to face sessions. x    Bone Density Measurement  Adults at risk for osteoporosis Once every 2 yrs      Lipid Panel Z13.6 All adults without CV disease Once every 5 yrs       Colorectal Cancer  Stool sample or Colonoscopy All adults 6 and older  Once every year Every 10 years x       Depression All Adults Once a year x Today   Diabetes Screening Blood glucose, post glucose load, or GTT Z13.1 All adults at risk Pre-diabetics Once per year Twice per year      Diabetes  Self-Management Training All adults Diabetics 10 hrs first year; 2 hours subsequent years. Requires Copay     Glaucoma Diabetics Family history of glaucoma African Americans 44 yrs + Hispanic Americans 4 yrs + Annually - requires coppay x     Hepatitis C Z72.89 or F19.20 High Risk for HCV Born between 1945 and 1965 Annually Once      HIV Z11.4 All adults based on risk Annually btw ages 87 & 33 regardless of risk Annually > 65 yrs if at increased risk      Lung Cancer Screening Asymptomatic adults aged 66-77  with 30 pack yr history and current smoker OR quit within the last 15 yrs Annually Must have counseling and shared decision making documentation before first screen x     Medical Nutrition Therapy Adults with  Diabetes Renal disease Kidney transplant within past 3 yrs 3 hours first year; 2 hours subsequent years     Obesity and Counseling All adults Screening once a year Counseling if BMI 30 or higher x Today   Tobacco Use Counseling Adults who use tobacco  Up to 8 visits in one year x    Vaccines Z23 Hepatitis B Influenza  Pneumonia  Adults  Once Once every flu season Two different vaccines separated by one year x    Next Annual Wellness Visit People with Medicare Every year  Today     Services & Screenings Women Who How Often Need  Date of Last Service Action  Mammogram  Z12.31 Women over 88 One baseline ages 57-39. Annually ager 40 yrs+      Pap tests All women Annually if high risk. Every 2 yrs for normal risk women      Screening for cervical cancer with  Pap (Z01.419 nl or Z01.411abnl) & HPV Z11.51 Women aged 36 to 24 Once every 5 yrs  Screening pelvic and breast exams All women Annually if high risk. Every 2 yrs for normal risk women     Sexually Transmitted Diseases Chlamydia Gonorrhea Syphilis All at risk adults Annually for non pregnant females at increased risk         [place] Men Who How Ofter Need  Date of Last Service Action  Prostate Cancer - DRE & PSA Men over 50 Annually.  DRE might require a copay.        Sexually Transmitted Diseases Syphilis All at risk adults Annually for men at increased risk      Health Maintenance List Health Maintenance  Topic Date Due   COVID-19 Vaccine (1) Never done   OPHTHALMOLOGY EXAM  Never done   TETANUS/TDAP  Never done   Zoster Vaccines- Shingrix (1 of 2) Never done   COLONOSCOPY (Pts 45-11yrs Insurance coverage will need to be confirmed)  Never done   PNA vac Low Risk Adult (2 of 2 - PCV13)  08/02/2015   FOOT EXAM  09/26/2016   URINE MICROALBUMIN  04/28/2017   HEMOGLOBIN A1C  08/27/2020   INFLUENZA VACCINE  09/09/2020   Hepatitis C Screening  Completed   HPV VACCINES  Aged Out

## 2020-08-09 DIAGNOSIS — M6281 Muscle weakness (generalized): Secondary | ICD-10-CM | POA: Diagnosis not present

## 2020-08-09 DIAGNOSIS — L89303 Pressure ulcer of unspecified buttock, stage 3: Secondary | ICD-10-CM | POA: Diagnosis not present

## 2020-08-09 DIAGNOSIS — I70229 Atherosclerosis of native arteries of extremities with rest pain, unspecified extremity: Secondary | ICD-10-CM | POA: Diagnosis not present

## 2020-08-09 DIAGNOSIS — R278 Other lack of coordination: Secondary | ICD-10-CM | POA: Diagnosis not present

## 2020-08-09 DIAGNOSIS — M4647 Discitis, unspecified, lumbosacral region: Secondary | ICD-10-CM | POA: Diagnosis not present

## 2020-08-09 DIAGNOSIS — S31000A Unspecified open wound of lower back and pelvis without penetration into retroperitoneum, initial encounter: Secondary | ICD-10-CM | POA: Diagnosis not present

## 2020-08-09 DIAGNOSIS — G8929 Other chronic pain: Secondary | ICD-10-CM | POA: Diagnosis not present

## 2020-08-12 DIAGNOSIS — R278 Other lack of coordination: Secondary | ICD-10-CM | POA: Diagnosis not present

## 2020-08-12 DIAGNOSIS — M6281 Muscle weakness (generalized): Secondary | ICD-10-CM | POA: Diagnosis not present

## 2020-08-13 ENCOUNTER — Telehealth: Payer: Self-pay | Admitting: Licensed Clinical Social Worker

## 2020-08-13 ENCOUNTER — Telehealth: Payer: Medicare Other | Admitting: Licensed Clinical Social Worker

## 2020-08-13 DIAGNOSIS — M6281 Muscle weakness (generalized): Secondary | ICD-10-CM | POA: Diagnosis not present

## 2020-08-13 DIAGNOSIS — R278 Other lack of coordination: Secondary | ICD-10-CM | POA: Diagnosis not present

## 2020-08-13 NOTE — Telephone Encounter (Signed)
  Care Management   Follow Up Note   08/13/2020 Name: [name] MRN: [id number] DOB: [birth year]   Referred by: Gaylan Gerold, DO Reason for referral : No chief complaint on file.   A second unsuccessful telephone outreach was attempted today. The patient was referred to the case management team for assistance with care management and care coordination.   Follow Up Plan: The patient has been provided with contact information for the care management team and has been advised to call with any health related questions or concerns.   Milus Height, [place]  Social Worker [facility] Care Management  [phone]

## 2020-08-14 DIAGNOSIS — R278 Other lack of coordination: Secondary | ICD-10-CM | POA: Diagnosis not present

## 2020-08-14 DIAGNOSIS — L89154 Pressure ulcer of sacral region, stage 4: Secondary | ICD-10-CM | POA: Diagnosis not present

## 2020-08-14 DIAGNOSIS — M6281 Muscle weakness (generalized): Secondary | ICD-10-CM | POA: Diagnosis not present

## 2020-08-15 ENCOUNTER — Ambulatory Visit: Payer: Medicare Other | Admitting: Internal Medicine

## 2020-08-15 ENCOUNTER — Telehealth: Payer: Self-pay

## 2020-08-15 DIAGNOSIS — R278 Other lack of coordination: Secondary | ICD-10-CM | POA: Diagnosis not present

## 2020-08-15 DIAGNOSIS — M6281 Muscle weakness (generalized): Secondary | ICD-10-CM | POA: Diagnosis not present

## 2020-08-15 NOTE — Telephone Encounter (Signed)
Called patient to confirm appointment. Left voicemail to reschedule visit.  [name]

## 2020-08-16 DIAGNOSIS — R278 Other lack of coordination: Secondary | ICD-10-CM | POA: Diagnosis not present

## 2020-08-16 DIAGNOSIS — M6281 Muscle weakness (generalized): Secondary | ICD-10-CM | POA: Diagnosis not present

## 2020-08-18 DIAGNOSIS — R918 Other nonspecific abnormal finding of lung field: Secondary | ICD-10-CM | POA: Diagnosis not present

## 2020-08-18 DIAGNOSIS — I517 Cardiomegaly: Secondary | ICD-10-CM | POA: Diagnosis not present

## 2020-08-19 DIAGNOSIS — S31000A Unspecified open wound of lower back and pelvis without penetration into retroperitoneum, initial encounter: Secondary | ICD-10-CM | POA: Diagnosis not present

## 2020-08-19 DIAGNOSIS — M6281 Muscle weakness (generalized): Secondary | ICD-10-CM | POA: Diagnosis not present

## 2020-08-19 DIAGNOSIS — R278 Other lack of coordination: Secondary | ICD-10-CM | POA: Diagnosis not present

## 2020-08-19 DIAGNOSIS — L8990 Pressure ulcer of unspecified site, unspecified stage: Secondary | ICD-10-CM | POA: Diagnosis not present

## 2020-08-19 DIAGNOSIS — M4647 Discitis, unspecified, lumbosacral region: Secondary | ICD-10-CM | POA: Diagnosis not present

## 2020-08-19 DIAGNOSIS — G8929 Other chronic pain: Secondary | ICD-10-CM | POA: Diagnosis not present

## 2020-08-19 DIAGNOSIS — I70229 Atherosclerosis of native arteries of extremities with rest pain, unspecified extremity: Secondary | ICD-10-CM | POA: Diagnosis not present

## 2020-08-20 DIAGNOSIS — R278 Other lack of coordination: Secondary | ICD-10-CM | POA: Diagnosis not present

## 2020-08-20 DIAGNOSIS — K5900 Constipation, unspecified: Secondary | ICD-10-CM | POA: Diagnosis not present

## 2020-08-20 DIAGNOSIS — M6281 Muscle weakness (generalized): Secondary | ICD-10-CM | POA: Diagnosis not present

## 2020-08-20 DIAGNOSIS — J9620 Acute and chronic respiratory failure, unspecified whether with hypoxia or hypercapnia: Secondary | ICD-10-CM | POA: Diagnosis not present

## 2020-08-21 ENCOUNTER — Ambulatory Visit: Payer: Medicare Other | Admitting: Internal Medicine

## 2020-08-21 ENCOUNTER — Telehealth: Payer: Self-pay

## 2020-08-21 ENCOUNTER — Other Ambulatory Visit (HOSPITAL_COMMUNITY): Payer: Self-pay

## 2020-08-21 DIAGNOSIS — M6281 Muscle weakness (generalized): Secondary | ICD-10-CM | POA: Diagnosis not present

## 2020-08-21 DIAGNOSIS — L89154 Pressure ulcer of sacral region, stage 4: Secondary | ICD-10-CM | POA: Diagnosis not present

## 2020-08-21 DIAGNOSIS — R278 Other lack of coordination: Secondary | ICD-10-CM | POA: Diagnosis not present

## 2020-08-21 NOTE — Telephone Encounter (Signed)
[facility] Patient Teacher, English as a foreign language completed.    The patient is insured through [employer] and has a 0.00 copay.  [employer] is covered under patient plan.  We will continue to follow to see if copay assistance is needed.  Ileene Patrick, [facility] Patient [facility] Phone: [phone] Fax:  [phone]

## 2020-08-22 DIAGNOSIS — M6281 Muscle weakness (generalized): Secondary | ICD-10-CM | POA: Diagnosis not present

## 2020-08-22 DIAGNOSIS — R278 Other lack of coordination: Secondary | ICD-10-CM | POA: Diagnosis not present

## 2020-08-23 DIAGNOSIS — M6281 Muscle weakness (generalized): Secondary | ICD-10-CM | POA: Diagnosis not present

## 2020-08-23 DIAGNOSIS — R278 Other lack of coordination: Secondary | ICD-10-CM | POA: Diagnosis not present

## 2020-08-26 DIAGNOSIS — M6281 Muscle weakness (generalized): Secondary | ICD-10-CM | POA: Diagnosis not present

## 2020-08-26 DIAGNOSIS — R278 Other lack of coordination: Secondary | ICD-10-CM | POA: Diagnosis not present

## 2020-08-27 DIAGNOSIS — R278 Other lack of coordination: Secondary | ICD-10-CM | POA: Diagnosis not present

## 2020-08-27 DIAGNOSIS — M6281 Muscle weakness (generalized): Secondary | ICD-10-CM | POA: Diagnosis not present

## 2020-08-28 DIAGNOSIS — L89154 Pressure ulcer of sacral region, stage 4: Secondary | ICD-10-CM | POA: Diagnosis not present

## 2020-08-28 DIAGNOSIS — R278 Other lack of coordination: Secondary | ICD-10-CM | POA: Diagnosis not present

## 2020-08-28 DIAGNOSIS — M6281 Muscle weakness (generalized): Secondary | ICD-10-CM | POA: Diagnosis not present

## 2020-08-29 DIAGNOSIS — R278 Other lack of coordination: Secondary | ICD-10-CM | POA: Diagnosis not present

## 2020-08-29 DIAGNOSIS — M6281 Muscle weakness (generalized): Secondary | ICD-10-CM | POA: Diagnosis not present

## 2020-08-30 DIAGNOSIS — F32A Depression, unspecified: Secondary | ICD-10-CM | POA: Diagnosis not present

## 2020-08-30 DIAGNOSIS — J449 Chronic obstructive pulmonary disease, unspecified: Secondary | ICD-10-CM | POA: Diagnosis not present

## 2020-08-30 DIAGNOSIS — E1149 Type 2 diabetes mellitus with other diabetic neurological complication: Secondary | ICD-10-CM | POA: Diagnosis not present

## 2020-08-30 DIAGNOSIS — E785 Hyperlipidemia, unspecified: Secondary | ICD-10-CM | POA: Diagnosis not present

## 2020-08-30 DIAGNOSIS — K219 Gastro-esophageal reflux disease without esophagitis: Secondary | ICD-10-CM | POA: Diagnosis not present

## 2020-08-30 DIAGNOSIS — M6281 Muscle weakness (generalized): Secondary | ICD-10-CM | POA: Diagnosis not present

## 2020-08-30 DIAGNOSIS — R278 Other lack of coordination: Secondary | ICD-10-CM | POA: Diagnosis not present

## 2020-08-30 DIAGNOSIS — M25519 Pain in unspecified shoulder: Secondary | ICD-10-CM | POA: Diagnosis not present

## 2020-09-02 DIAGNOSIS — M6281 Muscle weakness (generalized): Secondary | ICD-10-CM | POA: Diagnosis not present

## 2020-09-02 DIAGNOSIS — R278 Other lack of coordination: Secondary | ICD-10-CM | POA: Diagnosis not present

## 2020-09-03 DIAGNOSIS — R278 Other lack of coordination: Secondary | ICD-10-CM | POA: Diagnosis not present

## 2020-09-03 DIAGNOSIS — M6281 Muscle weakness (generalized): Secondary | ICD-10-CM | POA: Diagnosis not present

## 2020-09-04 DIAGNOSIS — L98422 Non-pressure chronic ulcer of back with fat layer exposed: Secondary | ICD-10-CM | POA: Diagnosis not present

## 2020-09-04 DIAGNOSIS — R278 Other lack of coordination: Secondary | ICD-10-CM | POA: Diagnosis not present

## 2020-09-04 DIAGNOSIS — L89154 Pressure ulcer of sacral region, stage 4: Secondary | ICD-10-CM | POA: Diagnosis not present

## 2020-09-04 DIAGNOSIS — M6281 Muscle weakness (generalized): Secondary | ICD-10-CM | POA: Diagnosis not present

## 2020-09-05 DIAGNOSIS — L89154 Pressure ulcer of sacral region, stage 4: Secondary | ICD-10-CM | POA: Diagnosis not present

## 2020-09-05 DIAGNOSIS — S21102D Unspecified open wound of left front wall of thorax without penetration into thoracic cavity, subsequent encounter: Secondary | ICD-10-CM | POA: Diagnosis not present

## 2020-09-09 ENCOUNTER — Ambulatory Visit: Payer: Medicare Other | Admitting: Internal Medicine

## 2020-09-17 DIAGNOSIS — I70229 Atherosclerosis of native arteries of extremities with rest pain, unspecified extremity: Secondary | ICD-10-CM | POA: Diagnosis not present

## 2020-09-17 DIAGNOSIS — M4647 Discitis, unspecified, lumbosacral region: Secondary | ICD-10-CM | POA: Diagnosis not present

## 2020-09-17 DIAGNOSIS — S31000A Unspecified open wound of lower back and pelvis without penetration into retroperitoneum, initial encounter: Secondary | ICD-10-CM | POA: Diagnosis not present

## 2020-09-17 DIAGNOSIS — L89303 Pressure ulcer of unspecified buttock, stage 3: Secondary | ICD-10-CM | POA: Diagnosis not present

## 2020-09-17 DIAGNOSIS — G8929 Other chronic pain: Secondary | ICD-10-CM | POA: Diagnosis not present

## 2020-09-18 DIAGNOSIS — L89154 Pressure ulcer of sacral region, stage 4: Secondary | ICD-10-CM | POA: Diagnosis not present

## 2020-09-19 DIAGNOSIS — L8990 Pressure ulcer of unspecified site, unspecified stage: Secondary | ICD-10-CM | POA: Diagnosis not present

## 2020-09-19 DIAGNOSIS — M4647 Discitis, unspecified, lumbosacral region: Secondary | ICD-10-CM | POA: Diagnosis not present

## 2020-09-19 DIAGNOSIS — I70229 Atherosclerosis of native arteries of extremities with rest pain, unspecified extremity: Secondary | ICD-10-CM | POA: Diagnosis not present

## 2020-09-19 DIAGNOSIS — S31000A Unspecified open wound of lower back and pelvis without penetration into retroperitoneum, initial encounter: Secondary | ICD-10-CM | POA: Diagnosis not present

## 2020-09-19 DIAGNOSIS — G8929 Other chronic pain: Secondary | ICD-10-CM | POA: Diagnosis not present

## 2020-09-25 ENCOUNTER — Telehealth: Payer: Self-pay | Admitting: Licensed Clinical Social Worker

## 2020-09-25 ENCOUNTER — Telehealth: Payer: Medicare Other | Admitting: Licensed Clinical Social Worker

## 2020-09-25 DIAGNOSIS — L89154 Pressure ulcer of sacral region, stage 4: Secondary | ICD-10-CM | POA: Diagnosis not present

## 2020-09-25 NOTE — Telephone Encounter (Signed)
  Care Management   Follow Up Note   09/25/2020 Name: [name] MRN: [id number] DOB: 02/14/1950   Referred by: Gaylan Gerold, DO Reason for referral : No chief complaint on file.   Third unsuccessful telephone outreach was attempted today. The patient was referred to the case management team for assistance with care management and care coordination. The patient's primary care provider has been notified of our unsuccessful attempts to make or maintain contact with the patient. The care management team is pleased to engage with this patient at any time in the future should he/she be interested in assistance from the care management team.   Follow Up Plan: No further follow up required: SW left Vm for patient advising phone call attempts. SW advised for patient to reach out to SW if assistance is still needed.   Milus Height, [place]  Social Worker [facility] Care Management  [phone]

## 2020-10-03 DIAGNOSIS — H524 Presbyopia: Secondary | ICD-10-CM | POA: Diagnosis not present

## 2020-10-11 DIAGNOSIS — F32A Depression, unspecified: Secondary | ICD-10-CM | POA: Diagnosis not present

## 2020-10-11 DIAGNOSIS — E1149 Type 2 diabetes mellitus with other diabetic neurological complication: Secondary | ICD-10-CM | POA: Diagnosis not present

## 2020-10-11 DIAGNOSIS — K219 Gastro-esophageal reflux disease without esophagitis: Secondary | ICD-10-CM | POA: Diagnosis not present

## 2020-10-11 DIAGNOSIS — D649 Anemia, unspecified: Secondary | ICD-10-CM | POA: Diagnosis not present

## 2020-10-11 DIAGNOSIS — E785 Hyperlipidemia, unspecified: Secondary | ICD-10-CM | POA: Diagnosis not present

## 2020-10-11 DIAGNOSIS — J449 Chronic obstructive pulmonary disease, unspecified: Secondary | ICD-10-CM | POA: Diagnosis not present

## 2020-10-18 DIAGNOSIS — G8929 Other chronic pain: Secondary | ICD-10-CM | POA: Diagnosis not present

## 2020-10-18 DIAGNOSIS — S31000A Unspecified open wound of lower back and pelvis without penetration into retroperitoneum, initial encounter: Secondary | ICD-10-CM | POA: Diagnosis not present

## 2020-10-18 DIAGNOSIS — I70229 Atherosclerosis of native arteries of extremities with rest pain, unspecified extremity: Secondary | ICD-10-CM | POA: Diagnosis not present

## 2020-10-18 DIAGNOSIS — L89303 Pressure ulcer of unspecified buttock, stage 3: Secondary | ICD-10-CM | POA: Diagnosis not present

## 2020-10-18 DIAGNOSIS — M4647 Discitis, unspecified, lumbosacral region: Secondary | ICD-10-CM | POA: Diagnosis not present

## 2020-10-20 DIAGNOSIS — L8990 Pressure ulcer of unspecified site, unspecified stage: Secondary | ICD-10-CM | POA: Diagnosis not present

## 2020-10-20 DIAGNOSIS — I70229 Atherosclerosis of native arteries of extremities with rest pain, unspecified extremity: Secondary | ICD-10-CM | POA: Diagnosis not present

## 2020-10-20 DIAGNOSIS — S31000A Unspecified open wound of lower back and pelvis without penetration into retroperitoneum, initial encounter: Secondary | ICD-10-CM | POA: Diagnosis not present

## 2020-10-20 DIAGNOSIS — M4647 Discitis, unspecified, lumbosacral region: Secondary | ICD-10-CM | POA: Diagnosis not present

## 2020-10-20 DIAGNOSIS — G8929 Other chronic pain: Secondary | ICD-10-CM | POA: Diagnosis not present

## 2020-10-21 DIAGNOSIS — H5789 Other specified disorders of eye and adnexa: Secondary | ICD-10-CM | POA: Diagnosis not present

## 2020-11-14 DIAGNOSIS — R0989 Other specified symptoms and signs involving the circulatory and respiratory systems: Secondary | ICD-10-CM | POA: Diagnosis not present

## 2020-11-14 DIAGNOSIS — K5900 Constipation, unspecified: Secondary | ICD-10-CM | POA: Diagnosis not present

## 2020-11-14 DIAGNOSIS — N3001 Acute cystitis with hematuria: Secondary | ICD-10-CM | POA: Diagnosis not present

## 2020-11-14 DIAGNOSIS — K760 Fatty (change of) liver, not elsewhere classified: Secondary | ICD-10-CM | POA: Diagnosis not present

## 2020-11-14 DIAGNOSIS — R109 Unspecified abdominal pain: Secondary | ICD-10-CM | POA: Diagnosis not present

## 2020-11-14 DIAGNOSIS — R059 Cough, unspecified: Secondary | ICD-10-CM | POA: Diagnosis not present

## 2020-11-14 DIAGNOSIS — R918 Other nonspecific abnormal finding of lung field: Secondary | ICD-10-CM | POA: Diagnosis not present

## 2020-11-14 DIAGNOSIS — Z886 Allergy status to analgesic agent status: Secondary | ICD-10-CM | POA: Diagnosis not present

## 2020-11-14 DIAGNOSIS — Z20822 Contact with and (suspected) exposure to covid-19: Secondary | ICD-10-CM | POA: Diagnosis not present

## 2020-11-14 DIAGNOSIS — R1084 Generalized abdominal pain: Secondary | ICD-10-CM | POA: Diagnosis not present

## 2020-11-14 DIAGNOSIS — I700 Atherosclerosis of aorta: Secondary | ICD-10-CM | POA: Diagnosis not present

## 2020-11-14 DIAGNOSIS — R1110 Vomiting, unspecified: Secondary | ICD-10-CM | POA: Diagnosis not present

## 2020-11-14 DIAGNOSIS — R112 Nausea with vomiting, unspecified: Secondary | ICD-10-CM | POA: Diagnosis not present

## 2020-11-14 DIAGNOSIS — R140 Abdominal distension (gaseous): Secondary | ICD-10-CM | POA: Diagnosis not present

## 2020-11-14 DIAGNOSIS — R000 Tachycardia, unspecified: Secondary | ICD-10-CM | POA: Diagnosis not present

## 2020-11-14 DIAGNOSIS — Z885 Allergy status to narcotic agent status: Secondary | ICD-10-CM | POA: Diagnosis not present

## 2020-11-15 DIAGNOSIS — R0902 Hypoxemia: Secondary | ICD-10-CM | POA: Diagnosis not present

## 2020-11-15 DIAGNOSIS — Z743 Need for continuous supervision: Secondary | ICD-10-CM | POA: Diagnosis not present

## 2020-11-15 DIAGNOSIS — R6889 Other general symptoms and signs: Secondary | ICD-10-CM | POA: Diagnosis not present

## 2020-11-15 DIAGNOSIS — Z7401 Bed confinement status: Secondary | ICD-10-CM | POA: Diagnosis not present

## 2020-11-15 DIAGNOSIS — R531 Weakness: Secondary | ICD-10-CM | POA: Diagnosis not present

## 2020-11-15 DIAGNOSIS — R600 Localized edema: Secondary | ICD-10-CM | POA: Diagnosis not present

## 2020-11-17 DIAGNOSIS — L89303 Pressure ulcer of unspecified buttock, stage 3: Secondary | ICD-10-CM | POA: Diagnosis not present

## 2020-11-17 DIAGNOSIS — G8929 Other chronic pain: Secondary | ICD-10-CM | POA: Diagnosis not present

## 2020-11-17 DIAGNOSIS — I70229 Atherosclerosis of native arteries of extremities with rest pain, unspecified extremity: Secondary | ICD-10-CM | POA: Diagnosis not present

## 2020-11-17 DIAGNOSIS — M4647 Discitis, unspecified, lumbosacral region: Secondary | ICD-10-CM | POA: Diagnosis not present

## 2020-11-17 DIAGNOSIS — S31000A Unspecified open wound of lower back and pelvis without penetration into retroperitoneum, initial encounter: Secondary | ICD-10-CM | POA: Diagnosis not present

## 2020-11-19 DIAGNOSIS — I70229 Atherosclerosis of native arteries of extremities with rest pain, unspecified extremity: Secondary | ICD-10-CM | POA: Diagnosis not present

## 2020-11-19 DIAGNOSIS — L8990 Pressure ulcer of unspecified site, unspecified stage: Secondary | ICD-10-CM | POA: Diagnosis not present

## 2020-11-19 DIAGNOSIS — M4647 Discitis, unspecified, lumbosacral region: Secondary | ICD-10-CM | POA: Diagnosis not present

## 2020-11-19 DIAGNOSIS — G8929 Other chronic pain: Secondary | ICD-10-CM | POA: Diagnosis not present

## 2020-11-19 DIAGNOSIS — S31000A Unspecified open wound of lower back and pelvis without penetration into retroperitoneum, initial encounter: Secondary | ICD-10-CM | POA: Diagnosis not present

## 2020-11-25 DIAGNOSIS — L603 Nail dystrophy: Secondary | ICD-10-CM | POA: Diagnosis not present

## 2020-11-25 DIAGNOSIS — E1159 Type 2 diabetes mellitus with other circulatory complications: Secondary | ICD-10-CM | POA: Diagnosis not present

## 2020-11-27 DIAGNOSIS — H9193 Unspecified hearing loss, bilateral: Secondary | ICD-10-CM | POA: Diagnosis not present

## 2020-11-27 DIAGNOSIS — H9313 Tinnitus, bilateral: Secondary | ICD-10-CM | POA: Diagnosis not present

## 2020-12-04 DIAGNOSIS — L219 Seborrheic dermatitis, unspecified: Secondary | ICD-10-CM | POA: Diagnosis not present

## 2020-12-05 DIAGNOSIS — R059 Cough, unspecified: Secondary | ICD-10-CM | POA: Diagnosis not present

## 2020-12-13 DIAGNOSIS — K219 Gastro-esophageal reflux disease without esophagitis: Secondary | ICD-10-CM | POA: Diagnosis not present

## 2020-12-13 DIAGNOSIS — L219 Seborrheic dermatitis, unspecified: Secondary | ICD-10-CM | POA: Diagnosis not present

## 2020-12-13 DIAGNOSIS — R600 Localized edema: Secondary | ICD-10-CM | POA: Diagnosis not present

## 2020-12-13 DIAGNOSIS — E785 Hyperlipidemia, unspecified: Secondary | ICD-10-CM | POA: Diagnosis not present

## 2020-12-13 DIAGNOSIS — E1149 Type 2 diabetes mellitus with other diabetic neurological complication: Secondary | ICD-10-CM | POA: Diagnosis not present

## 2020-12-13 DIAGNOSIS — J449 Chronic obstructive pulmonary disease, unspecified: Secondary | ICD-10-CM | POA: Diagnosis not present

## 2020-12-13 DIAGNOSIS — F32A Depression, unspecified: Secondary | ICD-10-CM | POA: Diagnosis not present

## 2020-12-16 DIAGNOSIS — R600 Localized edema: Secondary | ICD-10-CM | POA: Diagnosis not present

## 2020-12-17 DIAGNOSIS — L219 Seborrheic dermatitis, unspecified: Secondary | ICD-10-CM | POA: Diagnosis not present

## 2020-12-17 DIAGNOSIS — R600 Localized edema: Secondary | ICD-10-CM | POA: Diagnosis not present

## 2020-12-18 DIAGNOSIS — M4647 Discitis, unspecified, lumbosacral region: Secondary | ICD-10-CM | POA: Diagnosis not present

## 2020-12-18 DIAGNOSIS — S31000A Unspecified open wound of lower back and pelvis without penetration into retroperitoneum, initial encounter: Secondary | ICD-10-CM | POA: Diagnosis not present

## 2020-12-18 DIAGNOSIS — I70229 Atherosclerosis of native arteries of extremities with rest pain, unspecified extremity: Secondary | ICD-10-CM | POA: Diagnosis not present

## 2020-12-18 DIAGNOSIS — L89303 Pressure ulcer of unspecified buttock, stage 3: Secondary | ICD-10-CM | POA: Diagnosis not present

## 2020-12-18 DIAGNOSIS — G8929 Other chronic pain: Secondary | ICD-10-CM | POA: Diagnosis not present

## 2020-12-20 DIAGNOSIS — M4647 Discitis, unspecified, lumbosacral region: Secondary | ICD-10-CM | POA: Diagnosis not present

## 2020-12-20 DIAGNOSIS — G8929 Other chronic pain: Secondary | ICD-10-CM | POA: Diagnosis not present

## 2020-12-20 DIAGNOSIS — S31000A Unspecified open wound of lower back and pelvis without penetration into retroperitoneum, initial encounter: Secondary | ICD-10-CM | POA: Diagnosis not present

## 2020-12-20 DIAGNOSIS — L8990 Pressure ulcer of unspecified site, unspecified stage: Secondary | ICD-10-CM | POA: Diagnosis not present

## 2020-12-20 DIAGNOSIS — I70229 Atherosclerosis of native arteries of extremities with rest pain, unspecified extremity: Secondary | ICD-10-CM | POA: Diagnosis not present

## 2021-01-21 IMAGING — CT CT CHEST W/ CM
2 of 4 series · 15 of 36 positions shown, 18 images · IV contrast (omnipaque)
Comparison: 03/28/2018

CLINICAL DATA: Lung cancer. Restaging.

EXAM:
CT CHEST WITH CONTRAST
TECHNIQUE: Multidetector CT imaging of the chest was performed during
intravenous contrast administration.
CONTRAST:  75mL OMNIPAQUE IOHEXOL 300 MG/ML  SOLN

[Series 3: chest w · axial · 0.79mm/px · z∈[+1240,+1526]mm · 12 of 170 slices shown, 15 images]
[im 14/170  mediastinal]
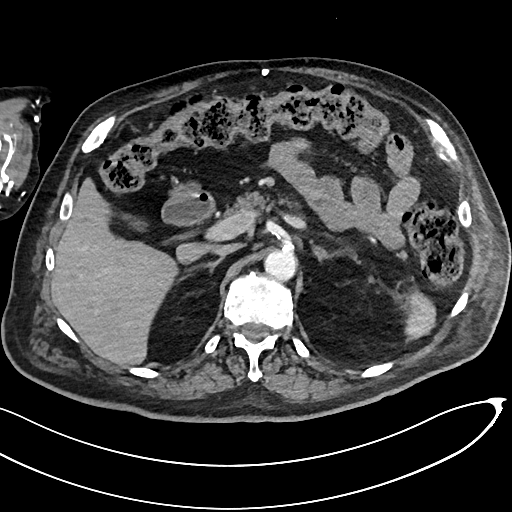
[im 14/170  lung]
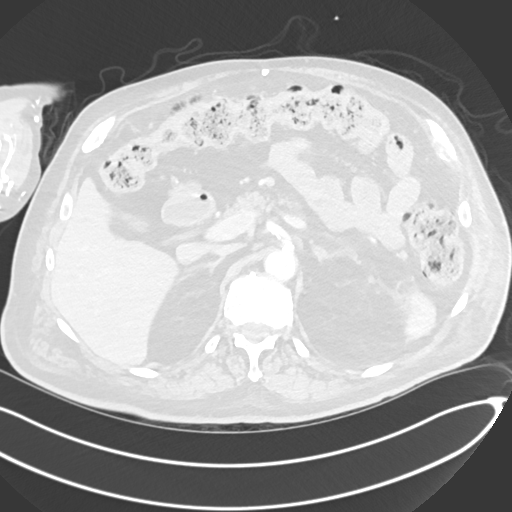
[im 27/170  lung]
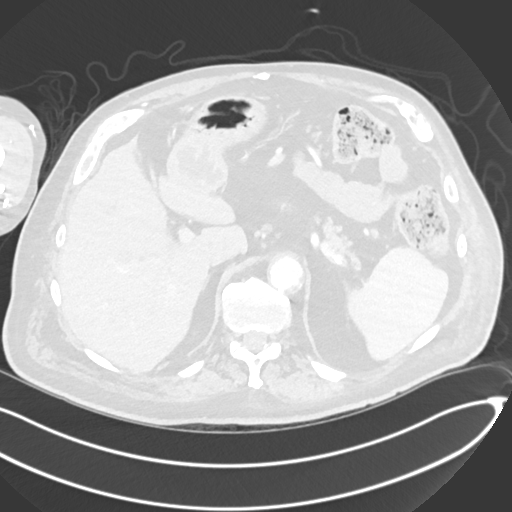
[im 40/170  lung]
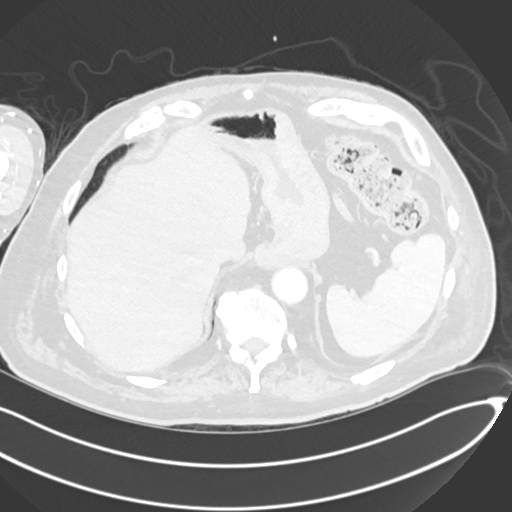
[im 53/170  lung]
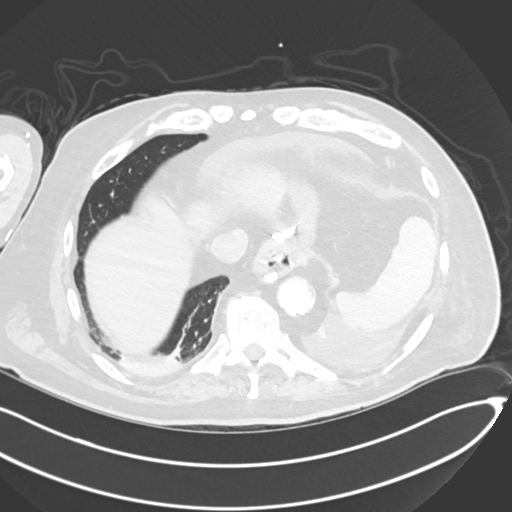
[im 66/170  mediastinal]
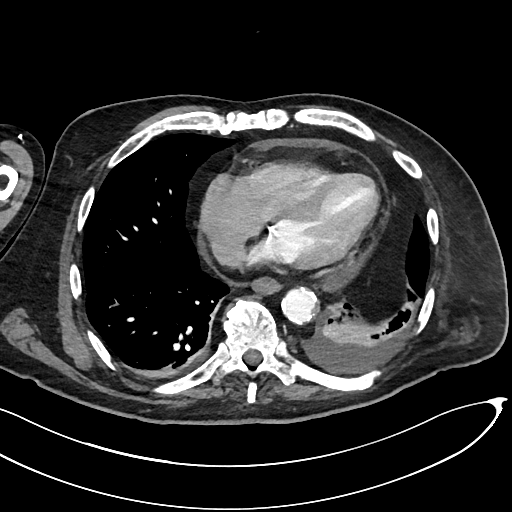
[im 66/170  lung]
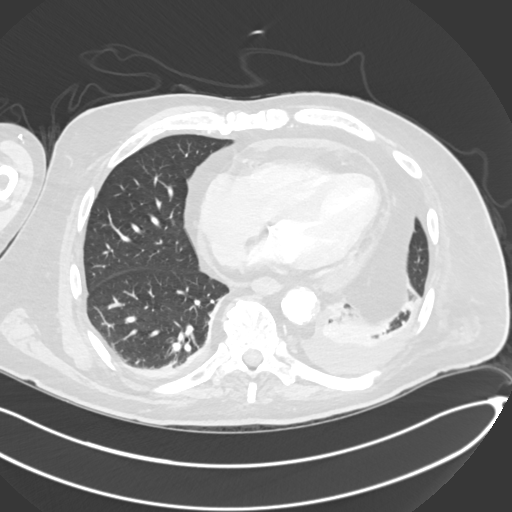
[im 79/170  lung]
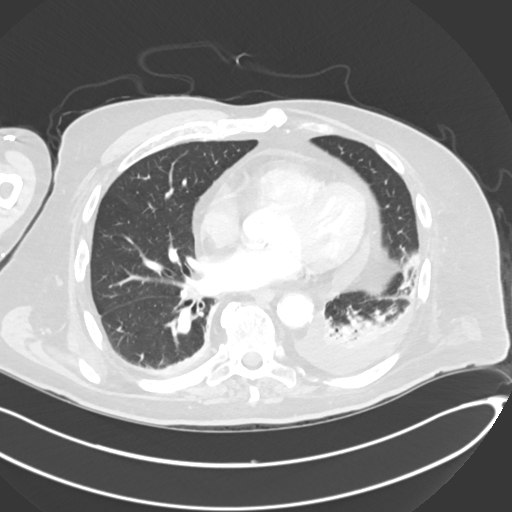
[im 92/170  lung]
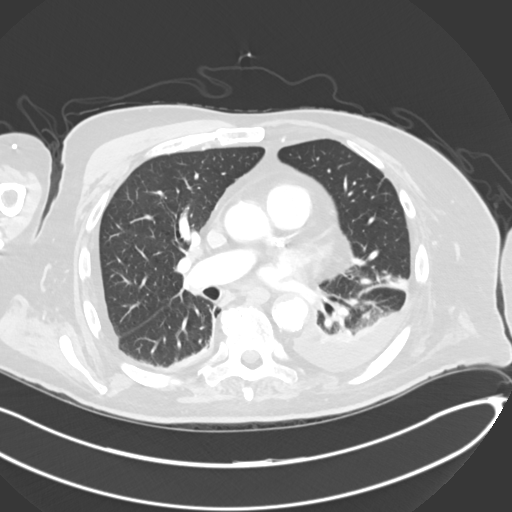
[im 105/170  lung]
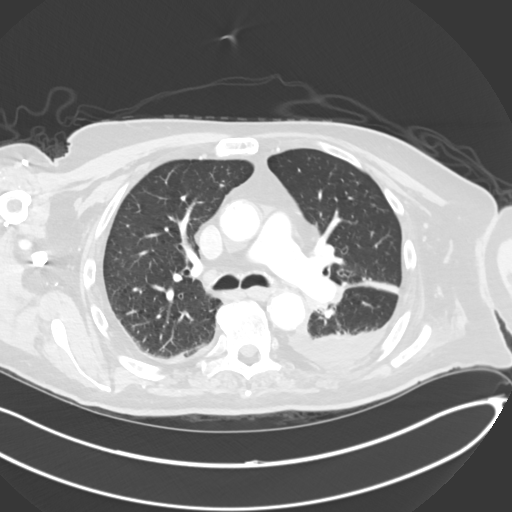
[im 118/170  mediastinal]
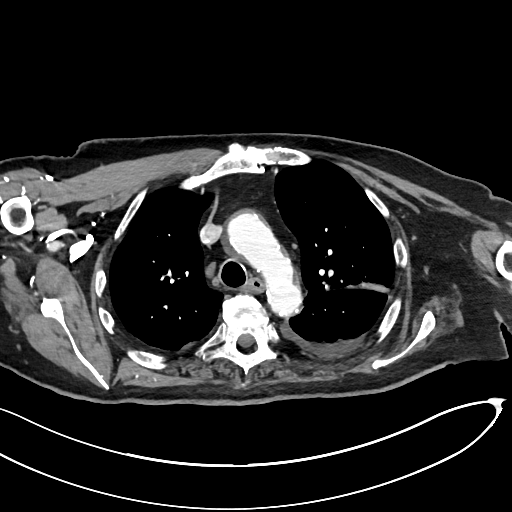
[im 118/170  lung]
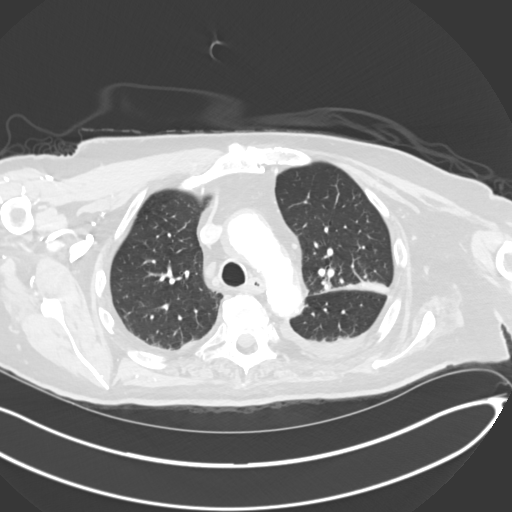
[im 131/170  lung]
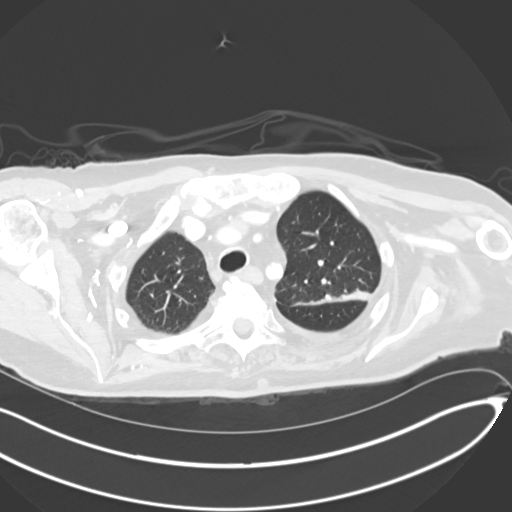
[im 144/170  lung]
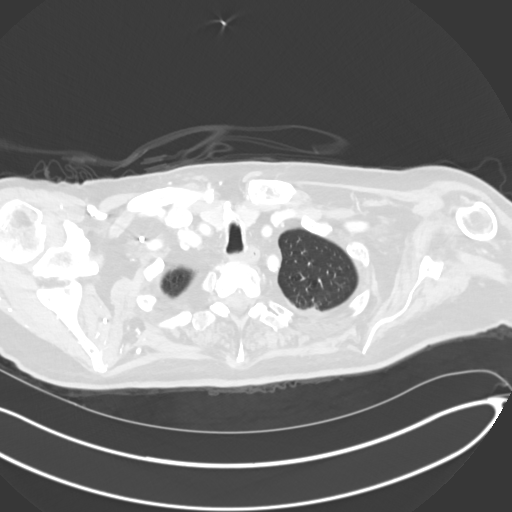
[im 157/170  lung]
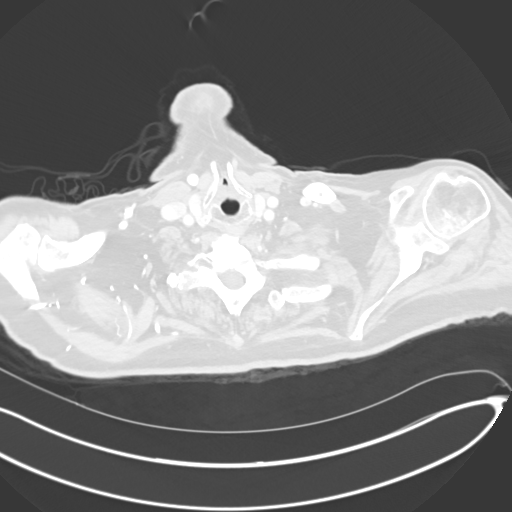

[Series 6: cor · coronal · 0.72mm/px · 3 of 149 slices shown]
[im 30/149  lung]
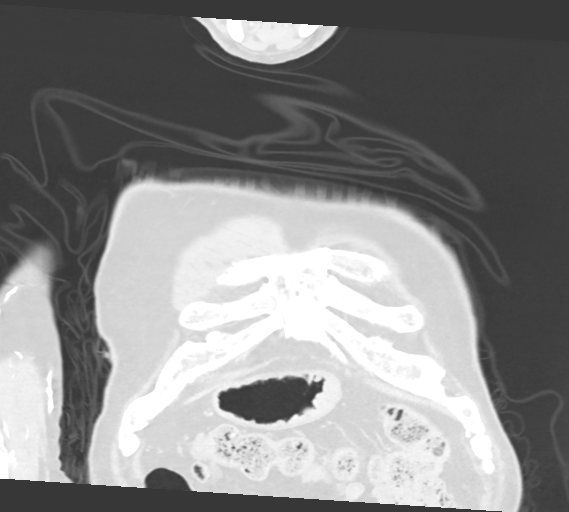
[im 60/149  lung]
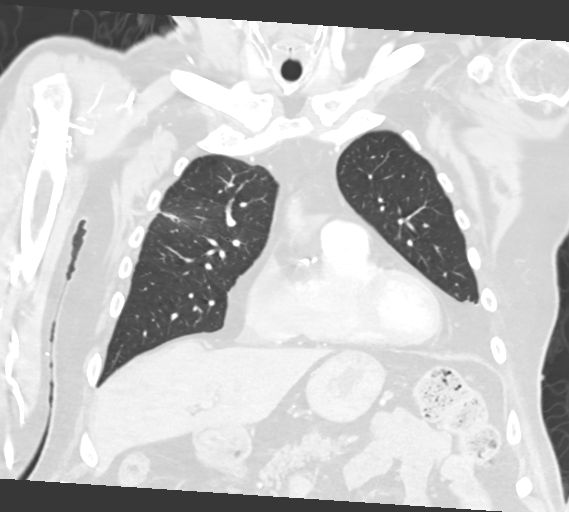
[im 89/149  lung]
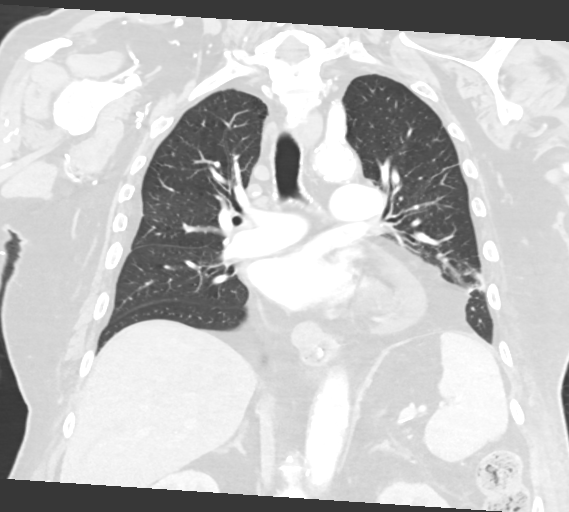

[15 of 36 positions shown; findings below may reference images not displayed]

FINDINGS: Cardiovascular: Heart size upper normal. Trace pericardial effusion
is new in the interval. Coronary artery calcification is evident.
Atherosclerotic calcification is noted in the wall of the thoracic
aorta. Atherosclerotic disease again noted at the aortic arch branch
vessel anatomy.

Mediastinum/Nodes: 10 mm short axis right paratracheal node on 55/3
is similar to minimally increased from 9 mm previously. Small left
hilar nodes measuring in the 8 mm short axis size range are mildly
more conspicuous. The esophagus has normal imaging features. There
is no axillary lymphadenopathy.

Lungs/Pleura: Centrilobular emphsyema noted.

Right upper lobe pulmonary nodule has decreased substantially in the
interval measuring 9 x 7 mm today compared to 1.9 x 1.5 cm
previously. Volume loss and bandlike opacity around this nodule
today is compatible with radiation change

There is bibasilar collapse/consolidative change on today's exam,
left greater than right and new since prior study. Small left
pleural effusion is progressive since previous exam and there is
some posterior left upper lobe atelectasis.

Upper Abdomen: Unremarkable.

Musculoskeletal: No worrisome lytic or sclerotic osseous
abnormality.
IMPRESSION: 1. Interval decrease in size of the right upper lobe pulmonary
nodule with surrounding post radiation change.
2. Interval progression of small left pleural effusion with
bibasilar collapse/consolidative change, left greater than right.
3. Borderline mediastinal lymphadenopathy, stable to minimally
increased in the interval. Continued attention on follow-up
recommended.
4. Aortic Atherosclerosis (KFR6U-LL0.0) and Emphysema (KFR6U-ESA.I).

## 2021-02-10 DIAGNOSIS — M24542 Contracture, left hand: Secondary | ICD-10-CM | POA: Diagnosis not present

## 2021-02-11 DIAGNOSIS — M24542 Contracture, left hand: Secondary | ICD-10-CM | POA: Diagnosis not present

## 2021-02-12 DIAGNOSIS — M24542 Contracture, left hand: Secondary | ICD-10-CM | POA: Diagnosis not present

## 2021-02-13 DIAGNOSIS — M24542 Contracture, left hand: Secondary | ICD-10-CM | POA: Diagnosis not present

## 2021-02-17 DIAGNOSIS — G8929 Other chronic pain: Secondary | ICD-10-CM | POA: Diagnosis not present

## 2021-02-17 DIAGNOSIS — S31000A Unspecified open wound of lower back and pelvis without penetration into retroperitoneum, initial encounter: Secondary | ICD-10-CM | POA: Diagnosis not present

## 2021-02-17 DIAGNOSIS — M4647 Discitis, unspecified, lumbosacral region: Secondary | ICD-10-CM | POA: Diagnosis not present

## 2021-02-17 DIAGNOSIS — I70229 Atherosclerosis of native arteries of extremities with rest pain, unspecified extremity: Secondary | ICD-10-CM | POA: Diagnosis not present

## 2021-02-17 DIAGNOSIS — L89303 Pressure ulcer of unspecified buttock, stage 3: Secondary | ICD-10-CM | POA: Diagnosis not present

## 2021-02-19 DIAGNOSIS — L8990 Pressure ulcer of unspecified site, unspecified stage: Secondary | ICD-10-CM | POA: Diagnosis not present

## 2021-02-19 DIAGNOSIS — I70229 Atherosclerosis of native arteries of extremities with rest pain, unspecified extremity: Secondary | ICD-10-CM | POA: Diagnosis not present

## 2021-02-19 DIAGNOSIS — G8929 Other chronic pain: Secondary | ICD-10-CM | POA: Diagnosis not present

## 2021-02-19 DIAGNOSIS — M4647 Discitis, unspecified, lumbosacral region: Secondary | ICD-10-CM | POA: Diagnosis not present

## 2021-02-19 DIAGNOSIS — S31000A Unspecified open wound of lower back and pelvis without penetration into retroperitoneum, initial encounter: Secondary | ICD-10-CM | POA: Diagnosis not present

## 2021-03-03 DIAGNOSIS — R059 Cough, unspecified: Secondary | ICD-10-CM | POA: Diagnosis not present

## 2021-03-06 DIAGNOSIS — Z760 Encounter for issue of repeat prescription: Secondary | ICD-10-CM | POA: Diagnosis not present

## 2021-03-06 DIAGNOSIS — M25512 Pain in left shoulder: Secondary | ICD-10-CM | POA: Diagnosis not present

## 2021-03-07 DIAGNOSIS — R918 Other nonspecific abnormal finding of lung field: Secondary | ICD-10-CM | POA: Diagnosis not present

## 2021-03-07 DIAGNOSIS — I517 Cardiomegaly: Secondary | ICD-10-CM | POA: Diagnosis not present

## 2021-03-10 DIAGNOSIS — R059 Cough, unspecified: Secondary | ICD-10-CM | POA: Diagnosis not present

## 2021-03-13 DIAGNOSIS — K219 Gastro-esophageal reflux disease without esophagitis: Secondary | ICD-10-CM | POA: Diagnosis not present

## 2021-03-13 DIAGNOSIS — J449 Chronic obstructive pulmonary disease, unspecified: Secondary | ICD-10-CM | POA: Diagnosis not present

## 2021-03-13 DIAGNOSIS — I2510 Atherosclerotic heart disease of native coronary artery without angina pectoris: Secondary | ICD-10-CM | POA: Diagnosis not present

## 2021-03-13 DIAGNOSIS — E1140 Type 2 diabetes mellitus with diabetic neuropathy, unspecified: Secondary | ICD-10-CM | POA: Diagnosis not present

## 2021-03-21 DIAGNOSIS — E119 Type 2 diabetes mellitus without complications: Secondary | ICD-10-CM | POA: Diagnosis not present

## 2021-03-21 DIAGNOSIS — I10 Essential (primary) hypertension: Secondary | ICD-10-CM | POA: Diagnosis not present

## 2021-03-24 DIAGNOSIS — E119 Type 2 diabetes mellitus without complications: Secondary | ICD-10-CM | POA: Diagnosis not present

## 2021-03-31 DIAGNOSIS — M25512 Pain in left shoulder: Secondary | ICD-10-CM | POA: Diagnosis not present

## 2021-03-31 DIAGNOSIS — R52 Pain, unspecified: Secondary | ICD-10-CM | POA: Diagnosis not present

## 2021-03-31 DIAGNOSIS — Z760 Encounter for issue of repeat prescription: Secondary | ICD-10-CM | POA: Diagnosis not present

## 2021-03-31 DIAGNOSIS — G629 Polyneuropathy, unspecified: Secondary | ICD-10-CM | POA: Diagnosis not present

## 2021-04-08 DIAGNOSIS — L603 Nail dystrophy: Secondary | ICD-10-CM | POA: Diagnosis not present

## 2021-04-08 DIAGNOSIS — E1159 Type 2 diabetes mellitus with other circulatory complications: Secondary | ICD-10-CM | POA: Diagnosis not present

## 2021-04-24 DIAGNOSIS — R058 Other specified cough: Secondary | ICD-10-CM | POA: Diagnosis not present

## 2021-05-13 DIAGNOSIS — R52 Pain, unspecified: Secondary | ICD-10-CM | POA: Diagnosis not present

## 2021-05-13 DIAGNOSIS — G629 Polyneuropathy, unspecified: Secondary | ICD-10-CM | POA: Diagnosis not present

## 2021-05-13 DIAGNOSIS — E785 Hyperlipidemia, unspecified: Secondary | ICD-10-CM | POA: Diagnosis not present

## 2021-05-13 DIAGNOSIS — K219 Gastro-esophageal reflux disease without esophagitis: Secondary | ICD-10-CM | POA: Diagnosis not present

## 2021-05-22 DIAGNOSIS — L03116 Cellulitis of left lower limb: Secondary | ICD-10-CM | POA: Diagnosis not present

## 2021-05-22 DIAGNOSIS — L03115 Cellulitis of right lower limb: Secondary | ICD-10-CM | POA: Diagnosis not present

## 2021-05-24 DIAGNOSIS — J811 Chronic pulmonary edema: Secondary | ICD-10-CM | POA: Diagnosis not present

## 2021-05-24 DIAGNOSIS — I517 Cardiomegaly: Secondary | ICD-10-CM | POA: Diagnosis not present

## 2021-05-24 DIAGNOSIS — R918 Other nonspecific abnormal finding of lung field: Secondary | ICD-10-CM | POA: Diagnosis not present

## 2021-05-26 DIAGNOSIS — J069 Acute upper respiratory infection, unspecified: Secondary | ICD-10-CM | POA: Diagnosis not present

## 2021-05-29 DIAGNOSIS — G629 Polyneuropathy, unspecified: Secondary | ICD-10-CM | POA: Diagnosis not present

## 2021-05-29 DIAGNOSIS — R52 Pain, unspecified: Secondary | ICD-10-CM | POA: Diagnosis not present

## 2021-06-02 DIAGNOSIS — I130 Hypertensive heart and chronic kidney disease with heart failure and stage 1 through stage 4 chronic kidney disease, or unspecified chronic kidney disease: Secondary | ICD-10-CM | POA: Diagnosis not present

## 2021-06-02 DIAGNOSIS — I10 Essential (primary) hypertension: Secondary | ICD-10-CM | POA: Diagnosis not present

## 2021-06-03 DIAGNOSIS — J069 Acute upper respiratory infection, unspecified: Secondary | ICD-10-CM | POA: Diagnosis not present

## 2021-06-03 DIAGNOSIS — G629 Polyneuropathy, unspecified: Secondary | ICD-10-CM | POA: Diagnosis not present

## 2021-06-03 DIAGNOSIS — L03115 Cellulitis of right lower limb: Secondary | ICD-10-CM | POA: Diagnosis not present

## 2021-06-03 DIAGNOSIS — L03116 Cellulitis of left lower limb: Secondary | ICD-10-CM | POA: Diagnosis not present

## 2021-06-16 DIAGNOSIS — G629 Polyneuropathy, unspecified: Secondary | ICD-10-CM | POA: Diagnosis not present

## 2021-06-16 DIAGNOSIS — J449 Chronic obstructive pulmonary disease, unspecified: Secondary | ICD-10-CM | POA: Diagnosis not present

## 2021-06-16 DIAGNOSIS — K219 Gastro-esophageal reflux disease without esophagitis: Secondary | ICD-10-CM | POA: Diagnosis not present

## 2021-06-16 DIAGNOSIS — E1140 Type 2 diabetes mellitus with diabetic neuropathy, unspecified: Secondary | ICD-10-CM | POA: Diagnosis not present

## 2021-06-20 DIAGNOSIS — E119 Type 2 diabetes mellitus without complications: Secondary | ICD-10-CM | POA: Diagnosis not present

## 2021-07-10 DIAGNOSIS — L03116 Cellulitis of left lower limb: Secondary | ICD-10-CM | POA: Diagnosis not present

## 2021-07-10 DIAGNOSIS — L03115 Cellulitis of right lower limb: Secondary | ICD-10-CM | POA: Diagnosis not present

## 2021-07-10 DIAGNOSIS — S81802D Unspecified open wound, left lower leg, subsequent encounter: Secondary | ICD-10-CM | POA: Diagnosis not present

## 2021-07-17 DIAGNOSIS — I10 Essential (primary) hypertension: Secondary | ICD-10-CM | POA: Diagnosis not present

## 2021-07-18 DIAGNOSIS — E1159 Type 2 diabetes mellitus with other circulatory complications: Secondary | ICD-10-CM | POA: Diagnosis not present

## 2021-07-21 DIAGNOSIS — L03116 Cellulitis of left lower limb: Secondary | ICD-10-CM | POA: Diagnosis not present

## 2021-07-21 DIAGNOSIS — L03115 Cellulitis of right lower limb: Secondary | ICD-10-CM | POA: Diagnosis not present

## 2021-07-28 DIAGNOSIS — L03116 Cellulitis of left lower limb: Secondary | ICD-10-CM | POA: Diagnosis not present

## 2021-08-12 DIAGNOSIS — E785 Hyperlipidemia, unspecified: Secondary | ICD-10-CM | POA: Diagnosis not present

## 2021-08-12 DIAGNOSIS — J449 Chronic obstructive pulmonary disease, unspecified: Secondary | ICD-10-CM | POA: Diagnosis not present

## 2021-08-29 DIAGNOSIS — J9601 Acute respiratory failure with hypoxia: Secondary | ICD-10-CM | POA: Diagnosis not present

## 2021-08-29 DIAGNOSIS — Z885 Allergy status to narcotic agent status: Secondary | ICD-10-CM | POA: Diagnosis not present

## 2021-08-29 DIAGNOSIS — F32A Depression, unspecified: Secondary | ICD-10-CM | POA: Diagnosis not present

## 2021-08-29 DIAGNOSIS — Z66 Do not resuscitate: Secondary | ICD-10-CM | POA: Diagnosis not present

## 2021-08-29 DIAGNOSIS — R7989 Other specified abnormal findings of blood chemistry: Secondary | ICD-10-CM | POA: Diagnosis not present

## 2021-08-29 DIAGNOSIS — R0600 Dyspnea, unspecified: Secondary | ICD-10-CM | POA: Diagnosis not present

## 2021-08-29 DIAGNOSIS — E785 Hyperlipidemia, unspecified: Secondary | ICD-10-CM | POA: Diagnosis not present

## 2021-08-29 DIAGNOSIS — Z87891 Personal history of nicotine dependence: Secondary | ICD-10-CM | POA: Diagnosis not present

## 2021-08-29 DIAGNOSIS — Z515 Encounter for palliative care: Secondary | ICD-10-CM | POA: Diagnosis not present

## 2021-08-29 DIAGNOSIS — Z888 Allergy status to other drugs, medicaments and biological substances status: Secondary | ICD-10-CM | POA: Diagnosis not present

## 2021-08-29 DIAGNOSIS — I5031 Acute diastolic (congestive) heart failure: Secondary | ICD-10-CM | POA: Diagnosis not present

## 2021-08-29 DIAGNOSIS — Z9989 Dependence on other enabling machines and devices: Secondary | ICD-10-CM | POA: Diagnosis not present

## 2021-08-29 DIAGNOSIS — I499 Cardiac arrhythmia, unspecified: Secondary | ICD-10-CM | POA: Diagnosis not present

## 2021-08-29 DIAGNOSIS — R0989 Other specified symptoms and signs involving the circulatory and respiratory systems: Secondary | ICD-10-CM | POA: Diagnosis not present

## 2021-08-29 DIAGNOSIS — F0284 Dementia in other diseases classified elsewhere, unspecified severity, with anxiety: Secondary | ICD-10-CM | POA: Diagnosis not present

## 2021-08-29 DIAGNOSIS — J9602 Acute respiratory failure with hypercapnia: Secondary | ICD-10-CM | POA: Diagnosis not present

## 2021-08-29 DIAGNOSIS — R0603 Acute respiratory distress: Secondary | ICD-10-CM | POA: Diagnosis not present

## 2021-08-29 DIAGNOSIS — J80 Acute respiratory distress syndrome: Secondary | ICD-10-CM | POA: Diagnosis not present

## 2021-08-29 DIAGNOSIS — R0902 Hypoxemia: Secondary | ICD-10-CM | POA: Diagnosis not present

## 2021-08-29 DIAGNOSIS — E119 Type 2 diabetes mellitus without complications: Secondary | ICD-10-CM | POA: Diagnosis not present

## 2021-08-29 DIAGNOSIS — J9621 Acute and chronic respiratory failure with hypoxia: Secondary | ICD-10-CM | POA: Diagnosis not present

## 2021-08-29 DIAGNOSIS — G8929 Other chronic pain: Secondary | ICD-10-CM | POA: Diagnosis not present

## 2021-08-29 DIAGNOSIS — J449 Chronic obstructive pulmonary disease, unspecified: Secondary | ICD-10-CM | POA: Diagnosis not present

## 2021-08-29 DIAGNOSIS — I69959 Hemiplegia and hemiparesis following unspecified cerebrovascular disease affecting unspecified side: Secondary | ICD-10-CM | POA: Diagnosis not present

## 2021-08-29 DIAGNOSIS — E1142 Type 2 diabetes mellitus with diabetic polyneuropathy: Secondary | ICD-10-CM | POA: Diagnosis not present

## 2021-08-29 DIAGNOSIS — A419 Sepsis, unspecified organism: Secondary | ICD-10-CM | POA: Diagnosis not present

## 2021-08-29 DIAGNOSIS — I10 Essential (primary) hypertension: Secondary | ICD-10-CM | POA: Diagnosis not present

## 2021-08-29 DIAGNOSIS — Z9981 Dependence on supplemental oxygen: Secondary | ICD-10-CM | POA: Diagnosis not present

## 2021-08-29 DIAGNOSIS — J9691 Respiratory failure, unspecified with hypoxia: Secondary | ICD-10-CM | POA: Diagnosis not present

## 2021-08-29 DIAGNOSIS — R0602 Shortness of breath: Secondary | ICD-10-CM | POA: Diagnosis not present

## 2021-08-29 DIAGNOSIS — Z7401 Bed confinement status: Secondary | ICD-10-CM | POA: Diagnosis not present

## 2021-08-29 DIAGNOSIS — F0282 Dementia in other diseases classified elsewhere, unspecified severity, with psychotic disturbance: Secondary | ICD-10-CM | POA: Diagnosis not present

## 2021-08-29 DIAGNOSIS — J9692 Respiratory failure, unspecified with hypercapnia: Secondary | ICD-10-CM | POA: Diagnosis not present

## 2021-08-29 DIAGNOSIS — J9622 Acute and chronic respiratory failure with hypercapnia: Secondary | ICD-10-CM | POA: Diagnosis not present

## 2021-08-29 DIAGNOSIS — G4700 Insomnia, unspecified: Secondary | ICD-10-CM | POA: Diagnosis not present

## 2021-08-29 DIAGNOSIS — K5900 Constipation, unspecified: Secondary | ICD-10-CM | POA: Diagnosis not present

## 2021-08-29 DIAGNOSIS — I110 Hypertensive heart disease with heart failure: Secondary | ICD-10-CM | POA: Diagnosis not present

## 2021-08-29 DIAGNOSIS — I69359 Hemiplegia and hemiparesis following cerebral infarction affecting unspecified side: Secondary | ICD-10-CM | POA: Diagnosis not present

## 2021-08-29 DIAGNOSIS — J690 Pneumonitis due to inhalation of food and vomit: Secondary | ICD-10-CM | POA: Diagnosis not present

## 2021-08-29 DIAGNOSIS — J9811 Atelectasis: Secondary | ICD-10-CM | POA: Diagnosis not present

## 2021-08-29 DIAGNOSIS — Z8673 Personal history of transient ischemic attack (TIA), and cerebral infarction without residual deficits: Secondary | ICD-10-CM | POA: Diagnosis not present

## 2021-08-29 DIAGNOSIS — I509 Heart failure, unspecified: Secondary | ICD-10-CM | POA: Diagnosis not present

## 2021-08-29 DIAGNOSIS — R0689 Other abnormalities of breathing: Secondary | ICD-10-CM | POA: Diagnosis not present

## 2021-08-29 DIAGNOSIS — Z20822 Contact with and (suspected) exposure to covid-19: Secondary | ICD-10-CM | POA: Diagnosis not present

## 2021-08-29 DIAGNOSIS — Z79899 Other long term (current) drug therapy: Secondary | ICD-10-CM | POA: Diagnosis not present

## 2021-08-29 DIAGNOSIS — R52 Pain, unspecified: Secondary | ICD-10-CM | POA: Diagnosis not present

## 2021-08-29 DIAGNOSIS — K2100 Gastro-esophageal reflux disease with esophagitis, without bleeding: Secondary | ICD-10-CM | POA: Diagnosis not present

## 2021-08-29 DIAGNOSIS — Z743 Need for continuous supervision: Secondary | ICD-10-CM | POA: Diagnosis not present

## 2021-08-29 DIAGNOSIS — I69354 Hemiplegia and hemiparesis following cerebral infarction affecting left non-dominant side: Secondary | ICD-10-CM | POA: Diagnosis not present

## 2021-08-29 DIAGNOSIS — J90 Pleural effusion, not elsewhere classified: Secondary | ICD-10-CM | POA: Diagnosis not present

## 2021-08-29 DIAGNOSIS — D649 Anemia, unspecified: Secondary | ICD-10-CM | POA: Diagnosis not present

## 2021-08-29 DIAGNOSIS — G309 Alzheimer's disease, unspecified: Secondary | ICD-10-CM | POA: Diagnosis not present

## 2021-08-30 DIAGNOSIS — R7989 Other specified abnormal findings of blood chemistry: Secondary | ICD-10-CM | POA: Diagnosis not present

## 2021-08-30 DIAGNOSIS — I509 Heart failure, unspecified: Secondary | ICD-10-CM | POA: Diagnosis not present

## 2021-08-30 DIAGNOSIS — R0603 Acute respiratory distress: Secondary | ICD-10-CM | POA: Diagnosis not present

## 2021-08-30 DIAGNOSIS — J90 Pleural effusion, not elsewhere classified: Secondary | ICD-10-CM | POA: Diagnosis not present

## 2021-08-30 DIAGNOSIS — Z9989 Dependence on other enabling machines and devices: Secondary | ICD-10-CM | POA: Diagnosis not present

## 2021-08-30 DIAGNOSIS — J9692 Respiratory failure, unspecified with hypercapnia: Secondary | ICD-10-CM | POA: Diagnosis not present

## 2021-08-30 DIAGNOSIS — Z79899 Other long term (current) drug therapy: Secondary | ICD-10-CM | POA: Diagnosis not present

## 2021-08-30 DIAGNOSIS — I69359 Hemiplegia and hemiparesis following cerebral infarction affecting unspecified side: Secondary | ICD-10-CM | POA: Diagnosis not present

## 2021-08-30 DIAGNOSIS — E119 Type 2 diabetes mellitus without complications: Secondary | ICD-10-CM | POA: Diagnosis not present

## 2021-08-30 DIAGNOSIS — R0902 Hypoxemia: Secondary | ICD-10-CM | POA: Diagnosis not present

## 2021-08-30 DIAGNOSIS — Z7401 Bed confinement status: Secondary | ICD-10-CM | POA: Diagnosis not present

## 2021-08-30 DIAGNOSIS — G309 Alzheimer's disease, unspecified: Secondary | ICD-10-CM | POA: Diagnosis not present

## 2021-08-31 DIAGNOSIS — J90 Pleural effusion, not elsewhere classified: Secondary | ICD-10-CM | POA: Diagnosis not present

## 2021-08-31 DIAGNOSIS — Z9989 Dependence on other enabling machines and devices: Secondary | ICD-10-CM | POA: Diagnosis not present

## 2021-08-31 DIAGNOSIS — I69359 Hemiplegia and hemiparesis following cerebral infarction affecting unspecified side: Secondary | ICD-10-CM | POA: Diagnosis not present

## 2021-08-31 DIAGNOSIS — G309 Alzheimer's disease, unspecified: Secondary | ICD-10-CM | POA: Diagnosis not present

## 2021-08-31 DIAGNOSIS — J9691 Respiratory failure, unspecified with hypoxia: Secondary | ICD-10-CM | POA: Diagnosis not present

## 2021-08-31 DIAGNOSIS — D649 Anemia, unspecified: Secondary | ICD-10-CM | POA: Diagnosis not present

## 2021-08-31 DIAGNOSIS — Z7401 Bed confinement status: Secondary | ICD-10-CM | POA: Diagnosis not present

## 2021-08-31 DIAGNOSIS — I509 Heart failure, unspecified: Secondary | ICD-10-CM | POA: Diagnosis not present

## 2021-08-31 DIAGNOSIS — E119 Type 2 diabetes mellitus without complications: Secondary | ICD-10-CM | POA: Diagnosis not present

## 2021-08-31 DIAGNOSIS — K5900 Constipation, unspecified: Secondary | ICD-10-CM | POA: Diagnosis not present

## 2021-08-31 DIAGNOSIS — J9692 Respiratory failure, unspecified with hypercapnia: Secondary | ICD-10-CM | POA: Diagnosis not present

## 2021-08-31 DIAGNOSIS — R0902 Hypoxemia: Secondary | ICD-10-CM | POA: Diagnosis not present

## 2021-08-31 DIAGNOSIS — R7989 Other specified abnormal findings of blood chemistry: Secondary | ICD-10-CM | POA: Diagnosis not present

## 2021-09-01 DIAGNOSIS — J9692 Respiratory failure, unspecified with hypercapnia: Secondary | ICD-10-CM | POA: Diagnosis not present

## 2021-09-01 DIAGNOSIS — Z9981 Dependence on supplemental oxygen: Secondary | ICD-10-CM | POA: Diagnosis not present

## 2021-09-01 DIAGNOSIS — Z79899 Other long term (current) drug therapy: Secondary | ICD-10-CM | POA: Diagnosis not present

## 2021-09-01 DIAGNOSIS — I509 Heart failure, unspecified: Secondary | ICD-10-CM | POA: Diagnosis not present

## 2021-09-01 DIAGNOSIS — I69354 Hemiplegia and hemiparesis following cerebral infarction affecting left non-dominant side: Secondary | ICD-10-CM | POA: Diagnosis not present

## 2021-09-01 DIAGNOSIS — Z7401 Bed confinement status: Secondary | ICD-10-CM | POA: Diagnosis not present

## 2021-09-02 DIAGNOSIS — I509 Heart failure, unspecified: Secondary | ICD-10-CM | POA: Diagnosis not present

## 2021-09-03 DIAGNOSIS — R0602 Shortness of breath: Secondary | ICD-10-CM | POA: Diagnosis not present

## 2021-09-03 DIAGNOSIS — R0902 Hypoxemia: Secondary | ICD-10-CM | POA: Diagnosis not present

## 2021-09-03 DIAGNOSIS — J9811 Atelectasis: Secondary | ICD-10-CM | POA: Diagnosis not present

## 2021-09-03 DIAGNOSIS — J90 Pleural effusion, not elsewhere classified: Secondary | ICD-10-CM | POA: Diagnosis not present

## 2021-09-04 DIAGNOSIS — Z515 Encounter for palliative care: Secondary | ICD-10-CM | POA: Diagnosis not present

## 2021-09-04 DIAGNOSIS — R7989 Other specified abnormal findings of blood chemistry: Secondary | ICD-10-CM | POA: Diagnosis not present

## 2021-09-04 DIAGNOSIS — G309 Alzheimer's disease, unspecified: Secondary | ICD-10-CM | POA: Diagnosis not present

## 2021-09-04 DIAGNOSIS — F0282 Dementia in other diseases classified elsewhere, unspecified severity, with psychotic disturbance: Secondary | ICD-10-CM | POA: Diagnosis not present

## 2021-09-04 DIAGNOSIS — I5031 Acute diastolic (congestive) heart failure: Secondary | ICD-10-CM | POA: Diagnosis not present

## 2021-09-04 DIAGNOSIS — I69959 Hemiplegia and hemiparesis following unspecified cerebrovascular disease affecting unspecified side: Secondary | ICD-10-CM | POA: Diagnosis not present

## 2021-09-04 DIAGNOSIS — Z7401 Bed confinement status: Secondary | ICD-10-CM | POA: Diagnosis not present

## 2021-09-04 DIAGNOSIS — J9622 Acute and chronic respiratory failure with hypercapnia: Secondary | ICD-10-CM | POA: Diagnosis not present

## 2021-09-04 DIAGNOSIS — E119 Type 2 diabetes mellitus without complications: Secondary | ICD-10-CM | POA: Diagnosis not present

## 2021-09-04 DIAGNOSIS — J9621 Acute and chronic respiratory failure with hypoxia: Secondary | ICD-10-CM | POA: Diagnosis not present

## 2021-10-10 DEATH — deceased

## 2021-11-30 IMAGING — DX DG CHEST 1V PORT
1 series · 1 of 1 positions shown · non-contrast
Comparison: October 27, 2018

CLINICAL DATA: Hypoxia and cough

EXAM:
PORTABLE CHEST 1 VIEW

[chest ap]
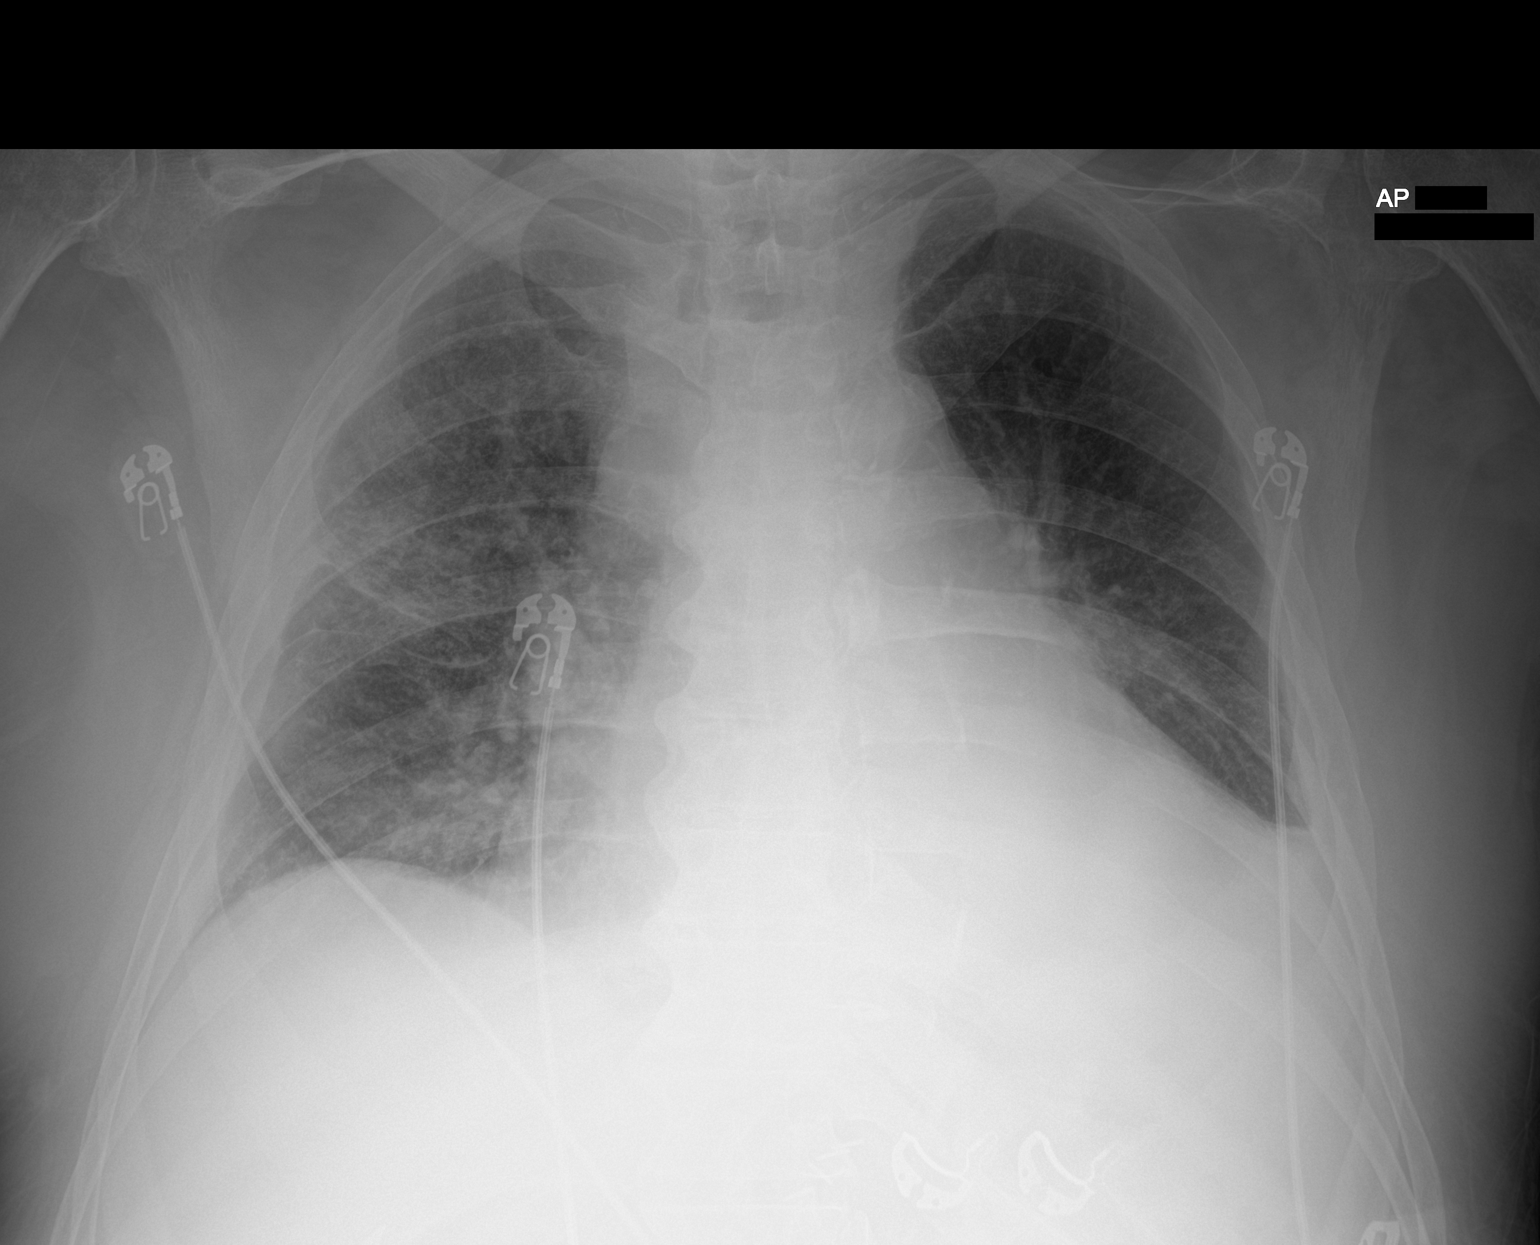

[1 of 1 positions shown; findings below may reference images not displayed]

FINDINGS: The heart size and mediastinal contours are unchanged with mild
cardiomegaly. Again noted is diffusely increased interstitial
markings seen throughout the right lung which appear worsened from
the prior exam with patchy airspace opacity in the right mid lung. A
small left effusion is seen. The visualized skeletal structures are
unremarkable.
IMPRESSION: Interstitial patchy airspace opacities throughout the right lung
which appears worsened from the prior exam could be due to worsening
chronic lung changes and/or superimposed pneumonia.

Left pleural effusion

## 2021-11-30 IMAGING — CT CT CHEST W/O CM
2 of 4 series · 14 of 36 positions shown, 17 images · non-contrast
Comparison: Chest CT 04/20/2019.

CLINICAL DATA: 69-year-old male with history of dyspnea. Chronic
chest wall or pleural disease suspected.

EXAM:
CT CHEST WITHOUT CONTRAST
TECHNIQUE: Multidetector CT imaging of the chest was performed following the
standard protocol without IV contrast.

[Series 2: thorax · axial · 0.85mm/px · z∈[-318,-34]mm · 11 of 164 slices shown, 14 images]
[im 11/164  mediastinal]
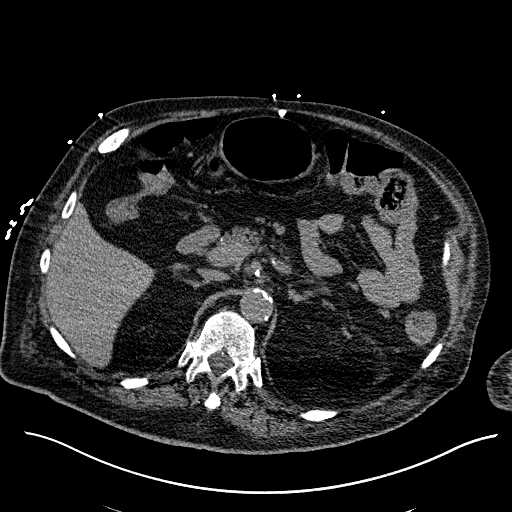
[im 11/164  lung]
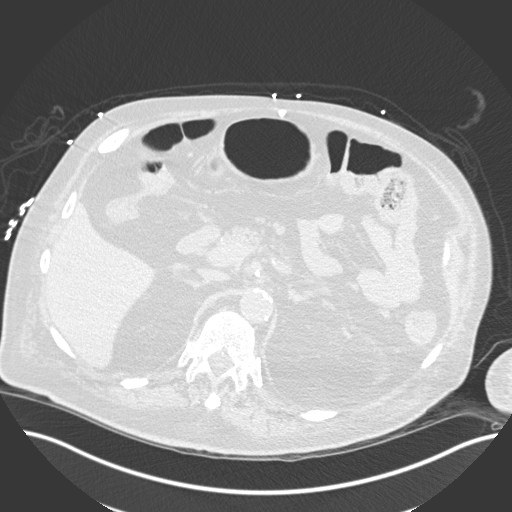
[im 22/164  lung]
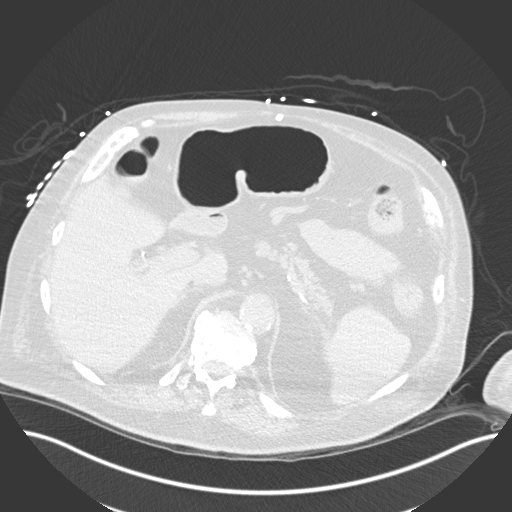
[im 44/164  lung]
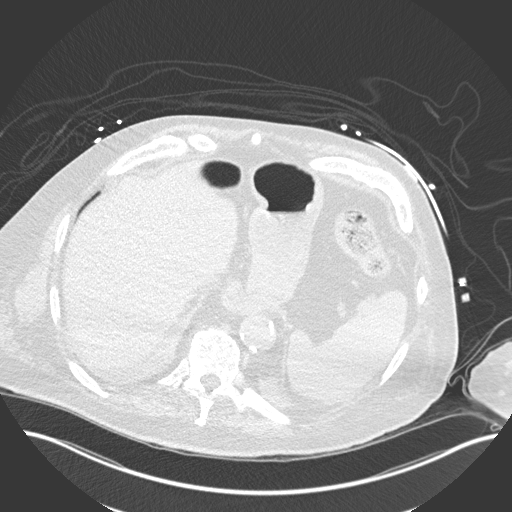
[im 55/164  lung]
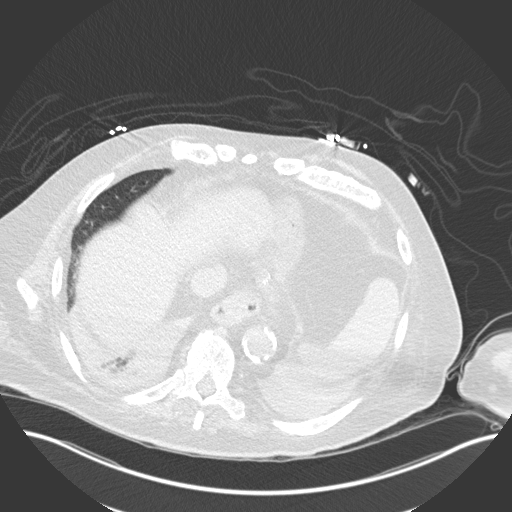
[im 66/164  mediastinal]
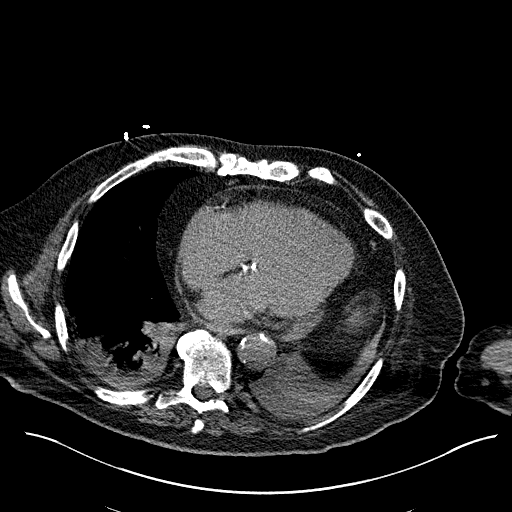
[im 66/164  lung]
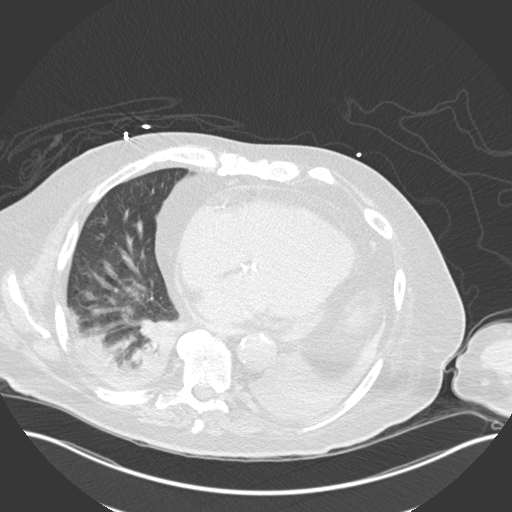
[im 87/164  lung]
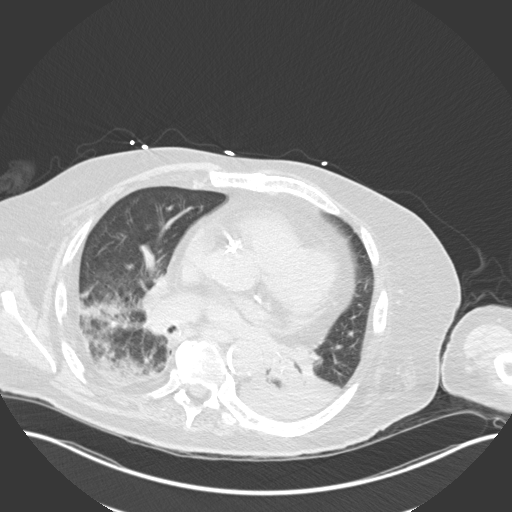
[im 98/164  lung]
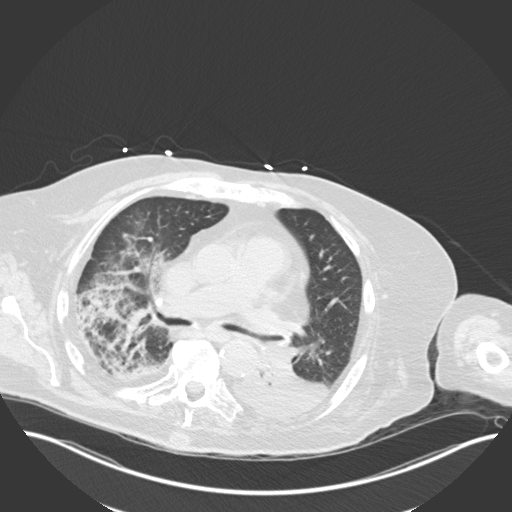
[im 109/164  lung]
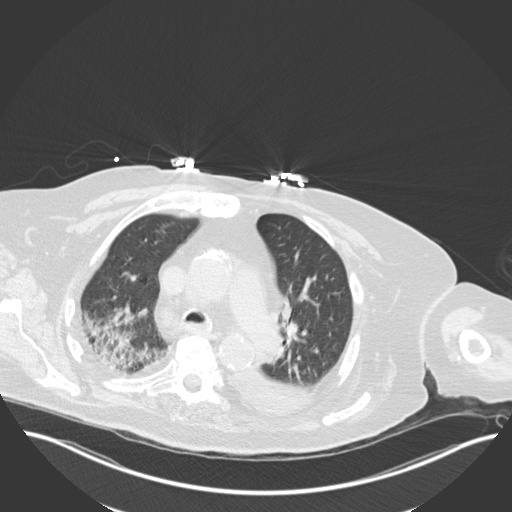
[im 120/164  mediastinal]
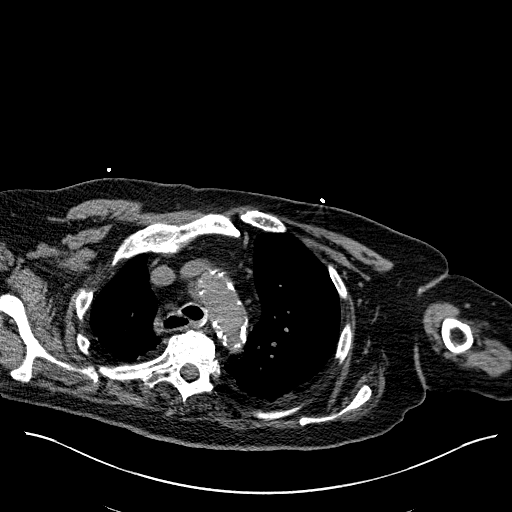
[im 120/164  lung]
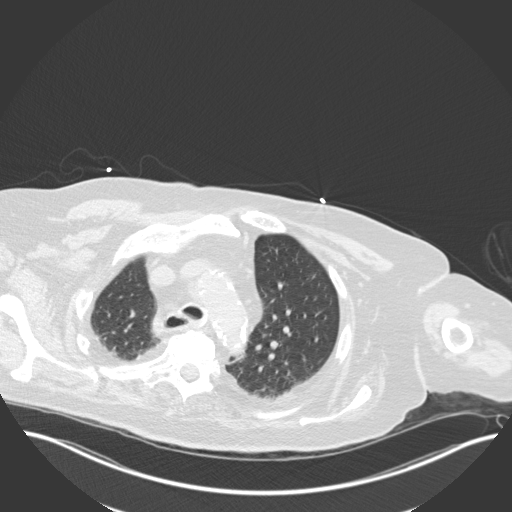
[im 142/164  lung]
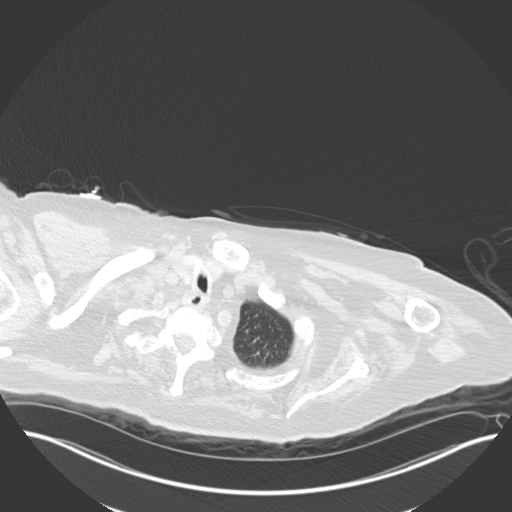
[im 153/164  lung]
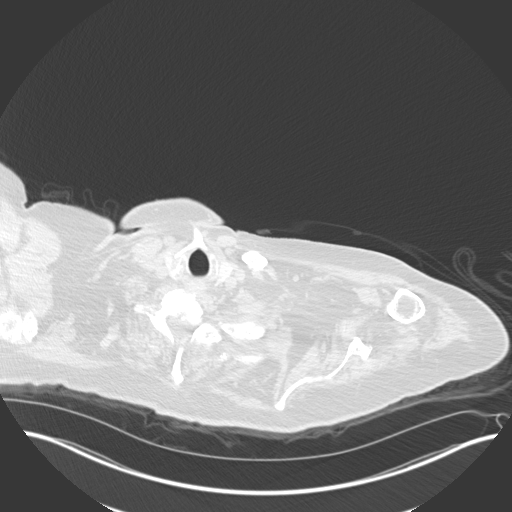

[Series 6: coronal · coronal · 0.66mm/px · 3 of 153 slices shown]
[im 31/153  lung]
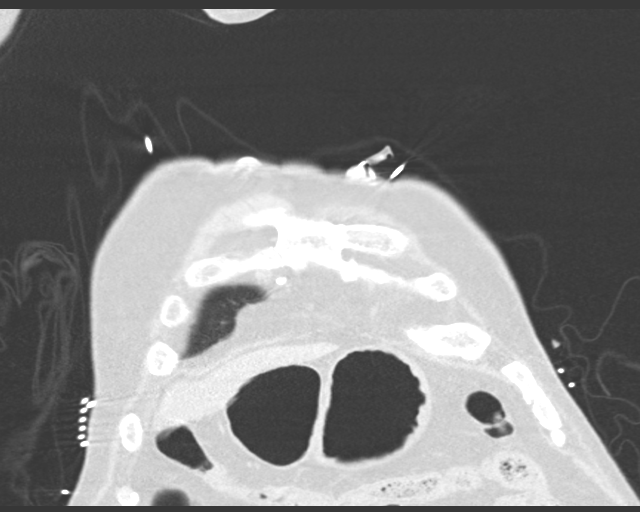
[im 61/153  lung]
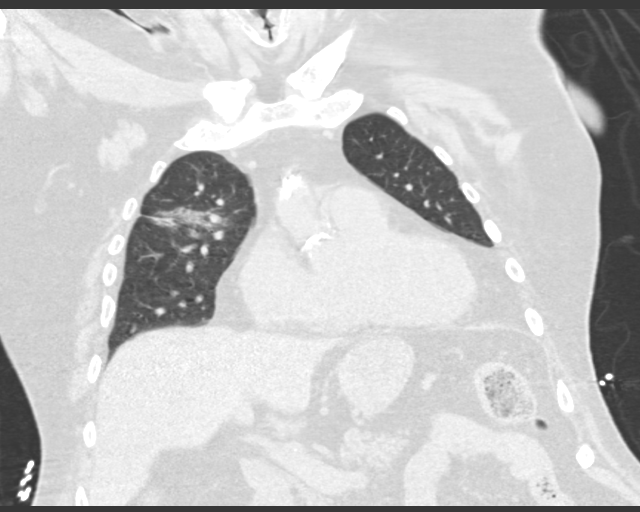
[im 92/153  lung]
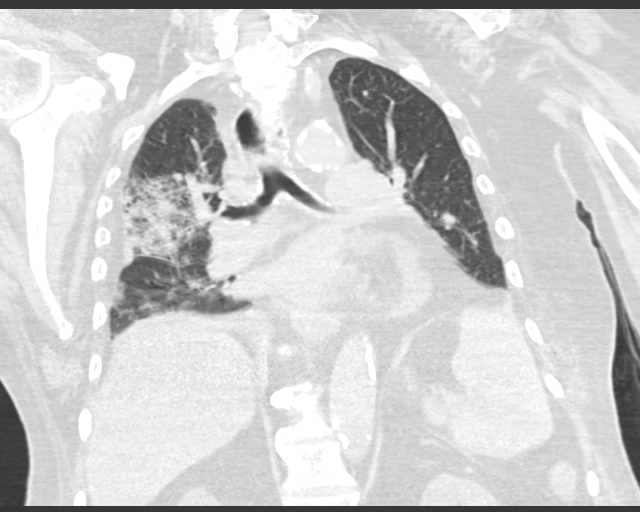

[14 of 36 positions shown; findings below may reference images not displayed]

FINDINGS: Cardiovascular: Heart size is normal. Trace amount of pericardial
fluid and/or thickening most evident adjacent to the left ventricle,
decreased compared to the prior study, and unlikely to be of
hemodynamic significance at this time. No associated pericardial
calcification. There is aortic atherosclerosis, as well as
atherosclerosis of the great vessels of the mediastinum and the
coronary arteries, including calcified atherosclerotic plaque in the
left main, left anterior descending, left circumflex and right
coronary arteries. Calcifications of the aortic valve.

Mediastinum/Nodes: No pathologically enlarged mediastinal or hilar
lymph nodes. Esophagus is unremarkable in appearance. No axillary
lymphadenopathy.

Lungs/Pleura: Small left pleural effusion lying dependently. Patchy
areas of airspace consolidation throughout the right upper and right
lower lobes. Areas of atelectasis are also noted in the lower lobes
of the lungs bilaterally, with near complete atelectasis of the left
lower lobe. No definite suspicious appearing pulmonary nodules or
masses are noted.

Upper Abdomen: Surgical clips adjacent to the gastroesophageal
junction. Diffuse low attenuation throughout the visualized hepatic
parenchyma, indicative of a background of hepatic steatosis. Status
post cholecystectomy.

Musculoskeletal: There are no aggressive appearing lytic or blastic
lesions noted in the visualized portions of the skeleton.
IMPRESSION: 1. Multilobar bronchopneumonia in the right lung most evident in the
right upper lobe and right lower lobe.
2. Small chronic left pleural effusion lying dependently with near
complete atelectasis of the left lower lobe, and some subsegmental
atelectasis in the right lower lobe.
3. Aortic atherosclerosis, in addition to left main and 3 vessel
coronary artery disease. Please note that although the presence of
coronary artery calcium documents the presence of coronary artery
disease, the severity of this disease and any potential stenosis
cannot be assessed on this non-gated CT examination. Assessment for
potential risk factor modification, dietary therapy or pharmacologic
therapy may be warranted, if clinically indicated.
4. There are calcifications of the aortic valve. Echocardiographic
correlation for evaluation of potential valvular dysfunction may be
warranted if clinically indicated.
5. Hepatic steatosis.

Aortic Atherosclerosis (A4BTE-UPH.H) and Emphysema (A4BTE-OTO.L).

## 2022-01-14 IMAGING — CT CT CHEST-ABD-PELV W/ CM
2 of 5 series · 13 of 46 positions shown, 15 images · IV contrast (omnipaque)
Comparison: 04/12/2020, 02/27/2020

CLINICAL DATA: Increasing abdominal pain and distension, rales on
physical exam

EXAM:
CT CHEST, ABDOMEN, AND PELVIS WITH CONTRAST
TECHNIQUE: Multidetector CT imaging of the chest, abdomen and pelvis was
performed following the standard protocol during bolus
administration of intravenous contrast.
CONTRAST:  100mL OMNIPAQUE IOHEXOL 300 MG/ML  SOLN

[Series 3: cap with · axial · 0.93mm/px · z∈[-568,+22]mm · 10 of 138 slices shown, 12 images]
[im 10/138  soft-tissue]
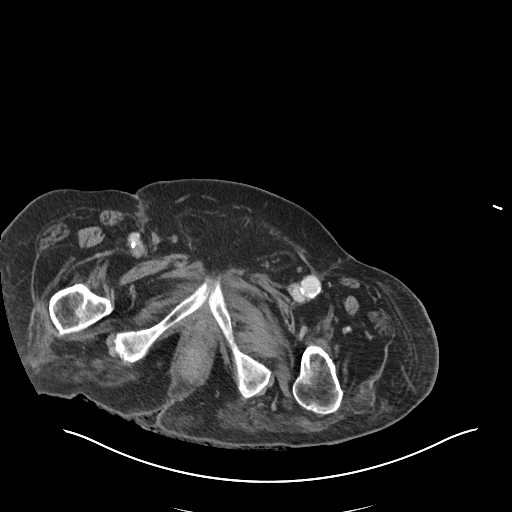
[im 10/138  bone]
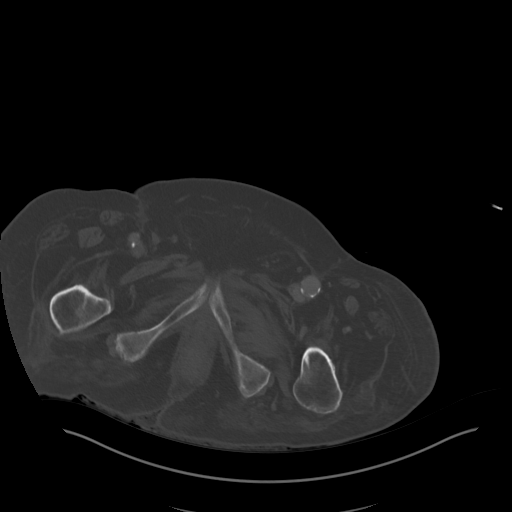
[im 20/138  soft-tissue]
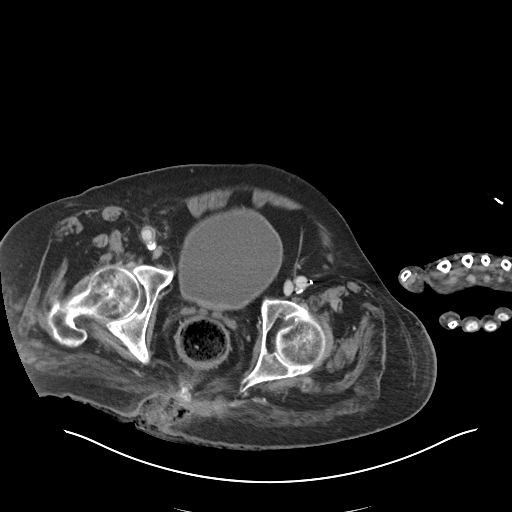
[im 40/138  soft-tissue]
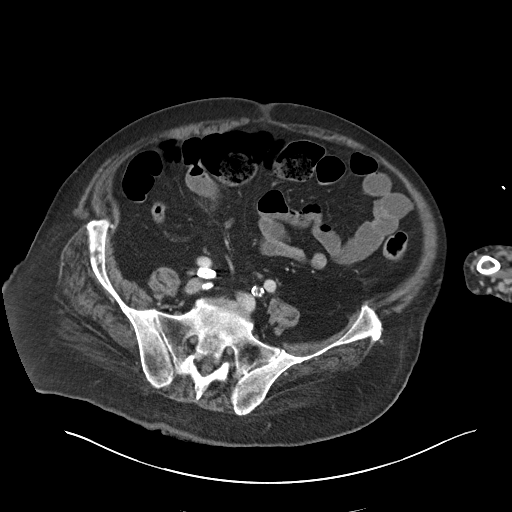
[im 49/138  soft-tissue]
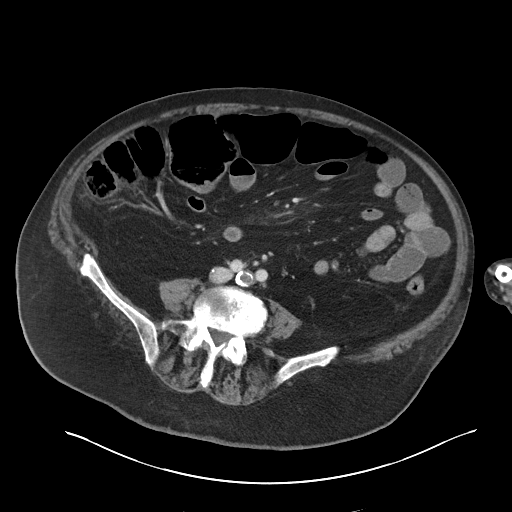
[im 59/138  soft-tissue]
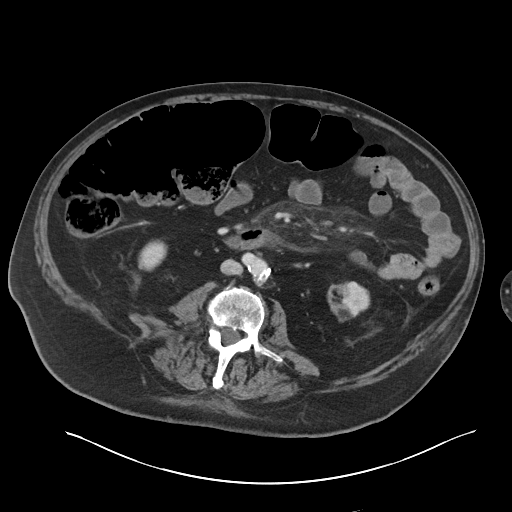
[im 79/138  soft-tissue]
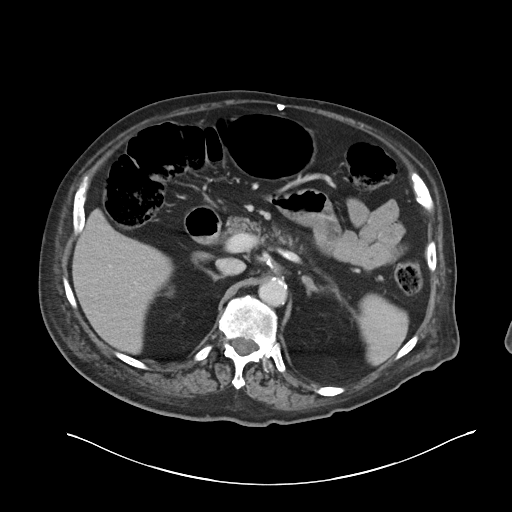
[im 89/138  soft-tissue]
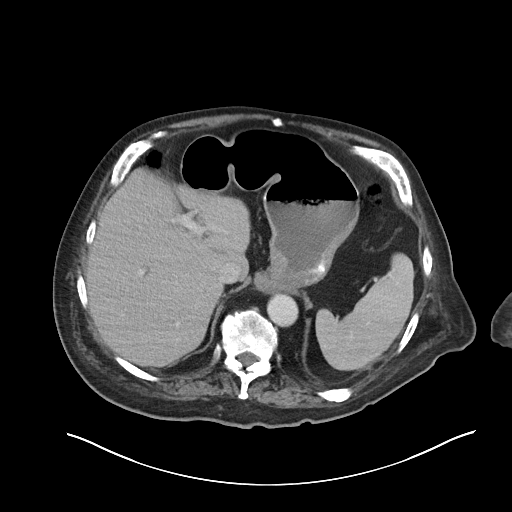
[im 98/138  soft-tissue]
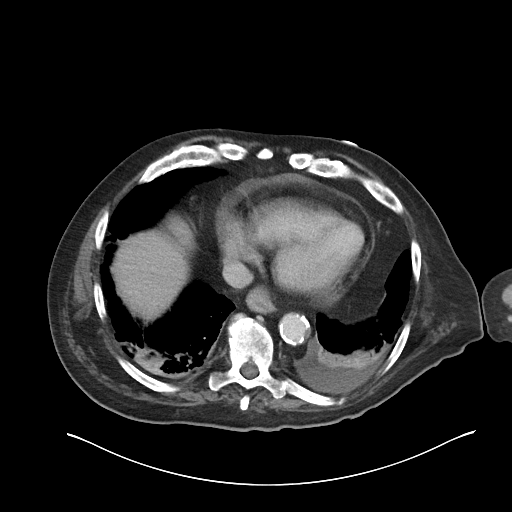
[im 118/138  soft-tissue]
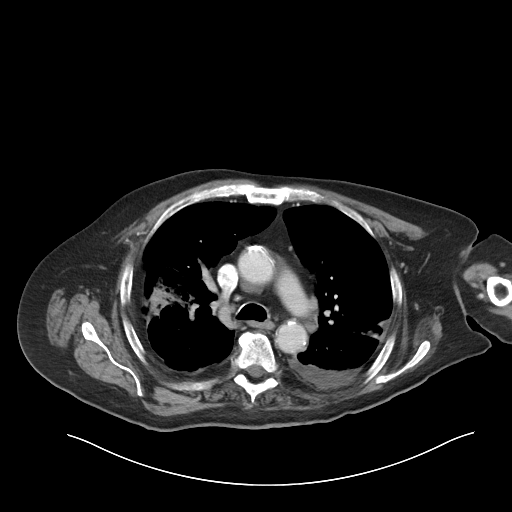
[im 118/138  bone]
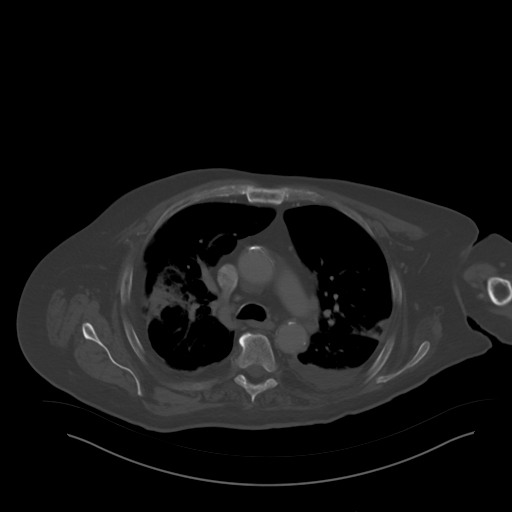
[im 128/138  soft-tissue]
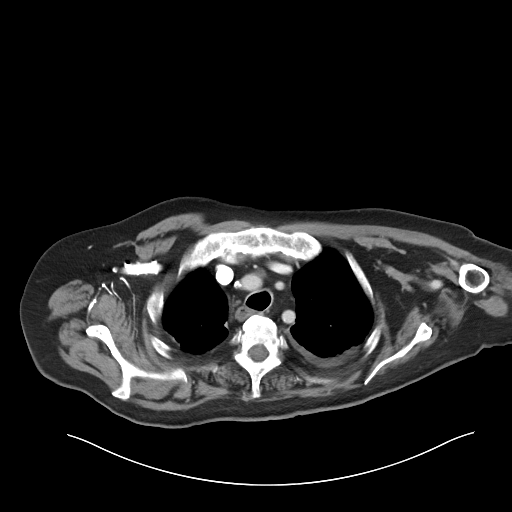

[Series 6: cor · coronal · 0.91mm/px · 3 of 120 slices shown]
[im 40/120  soft-tissue]
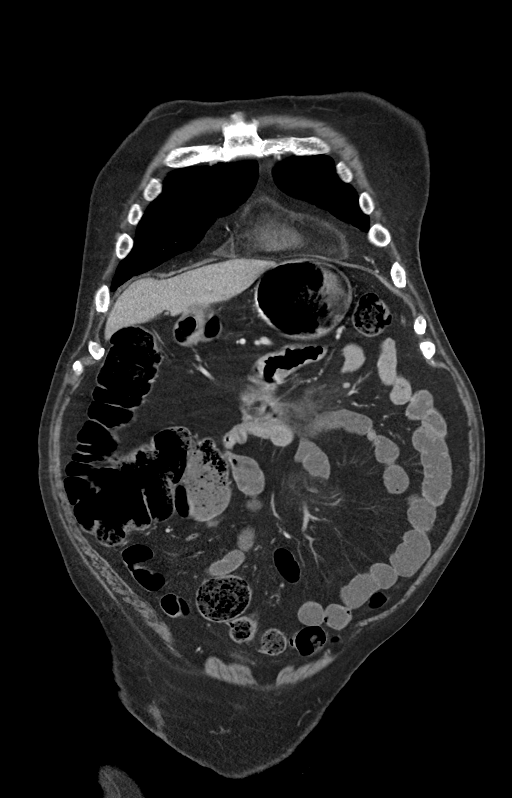
[im 53/120  soft-tissue]
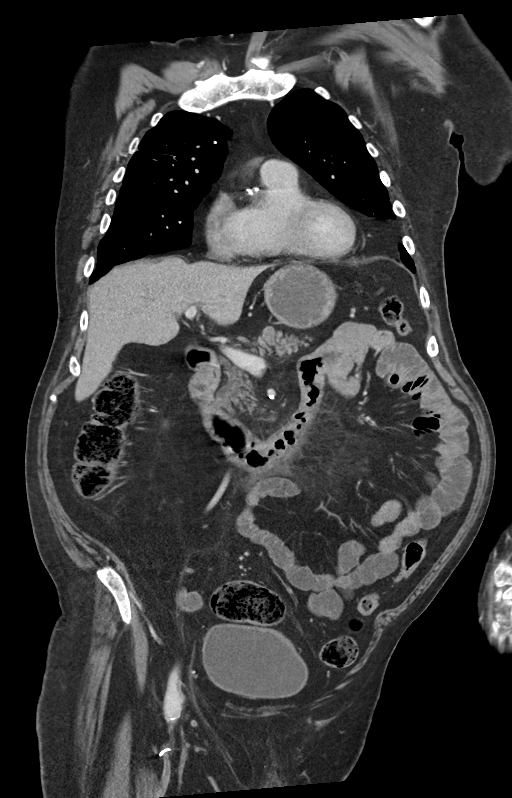
[im 67/120  soft-tissue]
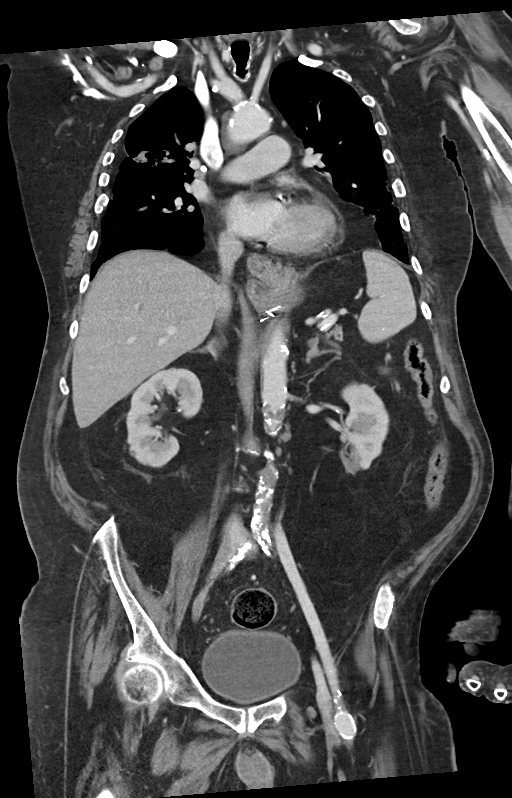

[13 of 46 positions shown; findings below may reference images not displayed]

FINDINGS: CT CHEST FINDINGS

Cardiovascular: There is a trace pericardial effusion. The heart is
not enlarged. No evidence of thoracic aortic aneurysm or dissection.
Extensive atherosclerosis throughout the aorta and coronary vessels.
High-grade stenosis at the origin of the innominate artery.

Mediastinum/Nodes: Stable borderline enlarged right paratracheal
lymph node measuring 10 mm in short axis. No other pathologic
adenopathy. Thyroid, trachea, and esophagus are grossly
unremarkable. Small hiatal hernia incidentally seen.

Lungs/Pleura: There are trace bilateral pleural effusions, left
greater than right. Dependent areas of atelectasis are seen within
the lower lobes. Linear consolidation within the right upper lobe
abutting the minor fissure most consistent with scarring. No
pneumothorax. Central airways are patent.

Musculoskeletal: No acute or destructive bony lesions. Likely Paget
disease involving the left posterior seventh rib, stable.
Reconstructed images demonstrate no additional findings.

CT ABDOMEN PELVIS FINDINGS

Hepatobiliary: No focal liver abnormality is seen. Status post
cholecystectomy. No biliary dilatation.

Pancreas: Unremarkable. No pancreatic ductal dilatation or
surrounding inflammatory changes.

Spleen: Normal in size without focal abnormality.

Adrenals/Urinary Tract: Stable indeterminate 8 mm left adrenal
nodule. The right adrenals unremarkable.

Chronic scarring lower pole left kidney. Otherwise the kidneys
enhance normally and symmetrically. No urinary tract calculi or
obstructive uropathy. Bladder is unremarkable.

Stomach/Bowel: No bowel obstruction or ileus. Moderate stool
throughout the colon. Normal appendix right lower quadrant.

There is fat stranding at the root of the mesentery along the
duodenal sweep, nonspecific. Findings could reflect duodenitis or
peptic ulcer disease in the appropriate clinical setting, though
nonspecific enteritis or mesenteric panniculitis can give a similar
appearance. No bowel wall thickening.

Vascular/Lymphatic: Extensive atherosclerosis of the upper abdominal
aorta and its branches. Postsurgical changes from aortobifemoral
bypass.

No pathologic adenopathy. Numerous subcentimeter lymph nodes at the
root of the mesentery are likely reactive.

Reproductive: Prostate is unremarkable.

Other: There is a large decubitus ulcer overlying the sacrococcygeal
junction, extending to the dorsal cortex of the sacrococcygeal
junction. Irregularity of the bony margin consistent with chronic
osteomyelitis.

No free intraperitoneal fluid or free gas. No abdominal wall
hernia.

Musculoskeletal: No acute displaced fractures. Reconstructed images
demonstrate no additional findings.
IMPRESSION: 1. Trace bilateral pleural effusions, left greater than right.
2. Bilateral areas of parenchymal lung consolidation consistent with
scarring and atelectasis. No acute airspace disease.
3. Inflammatory changes at the root of the mesentery, extending to
the region of the duodenal sweep. Differential includes duodenitis,
peptic ulcer disease, or nonspecific mesenteritis.
4. Large decubitus ulcer overlying the sacrococcygeal junction, with
evidence of chronic osteomyelitis at the sacrococcygeal junction.
5. Moderate fecal retention.
6. Stable indeterminate 8 mm left adrenal nodule.
7. Aortic Atherosclerosis ([id number]).

## 2022-01-14 IMAGING — CR DG CHEST 1V
1 series · 1 of 1 positions shown · non-contrast
Comparison: 02/27/2020

CLINICAL DATA: Cough

EXAM:
CHEST  1 VIEW

[chest ap]
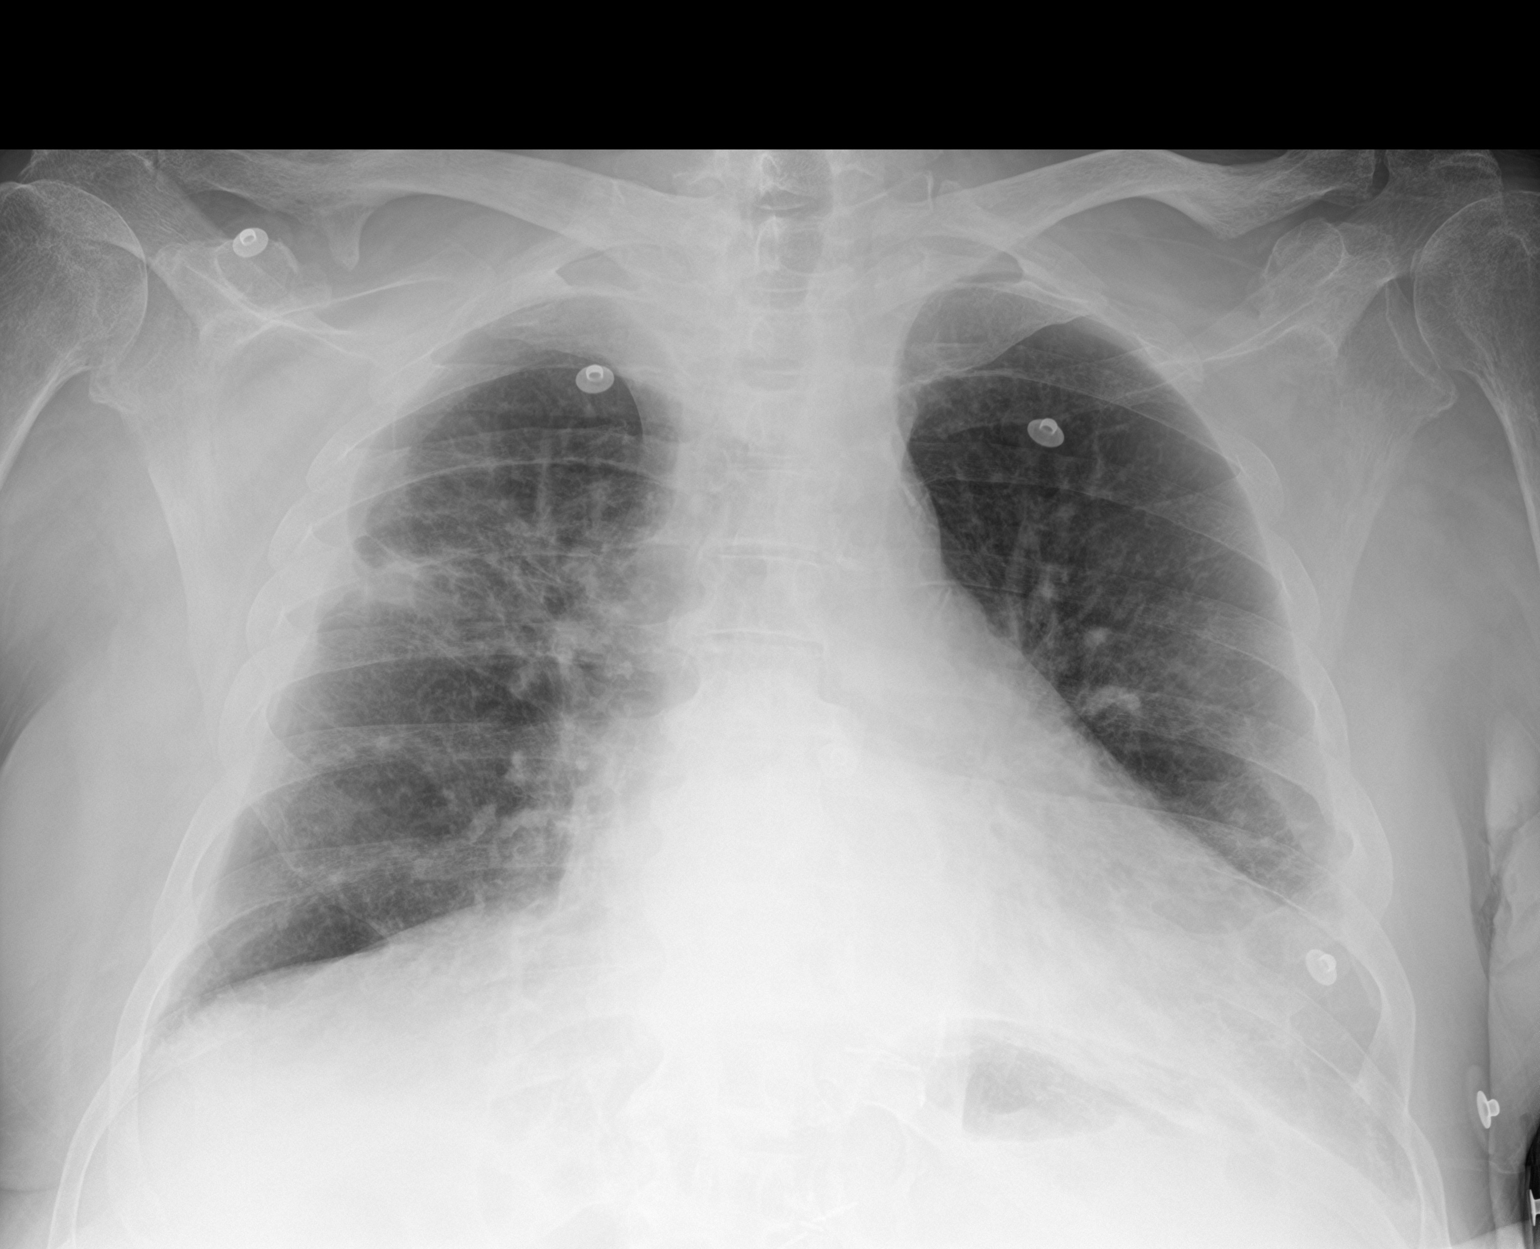

[1 of 1 positions shown; findings below may reference images not displayed]

FINDINGS: Cardiomegaly. Airspace opacity in the right upper lobe and left
base, similar to prior study. No visible effusions. No overt edema.
No acute bony abnormality.
IMPRESSION: Right upper lobe and left lower lobe airspace opacities could
reflect atelectasis, pneumonia, or scarring. Findings similar to
prior study.

## 2022-03-02 IMAGING — CT CT ABD-PELV W/ CM
2 of 5 series · 17 of 46 positions shown, 19 images · IV contrast (Omni 300)
Comparison: 04/12/2020

CLINICAL DATA: Abdominal distension, left lower quadrant

EXAM:
CT ABDOMEN AND PELVIS WITH CONTRAST
TECHNIQUE: Multidetector CT imaging of the abdomen and pelvis was performed
using the standard protocol following bolus administration of
intravenous contrast.
CONTRAST:  100mL OMNIPAQUE IOHEXOL 300 MG/ML  SOLN

[Series 3: a/p w/ 5mm · axial · 0.98mm/px · z∈[+604,+1069]mm · 14 of 105 slices shown, 16 images]
[im 6/105  soft-tissue]
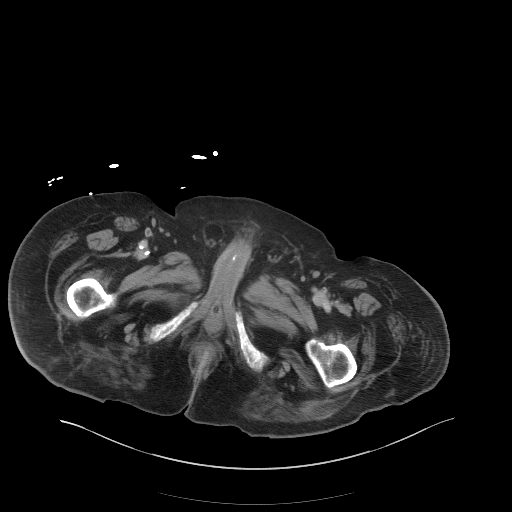
[im 6/105  bone]
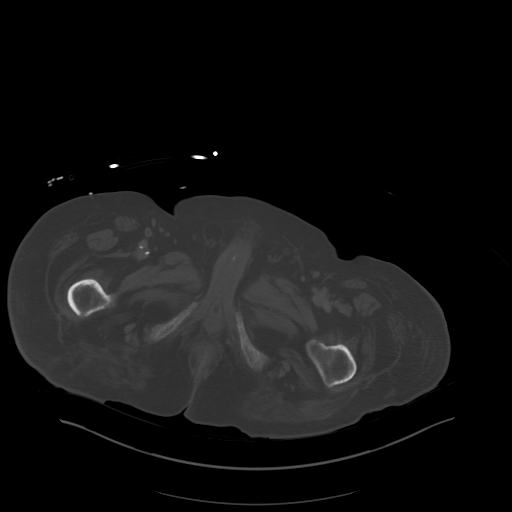
[im 16/105  soft-tissue]
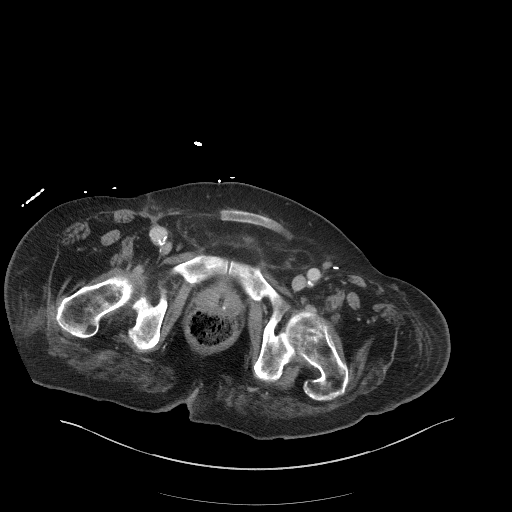
[im 21/105  soft-tissue]
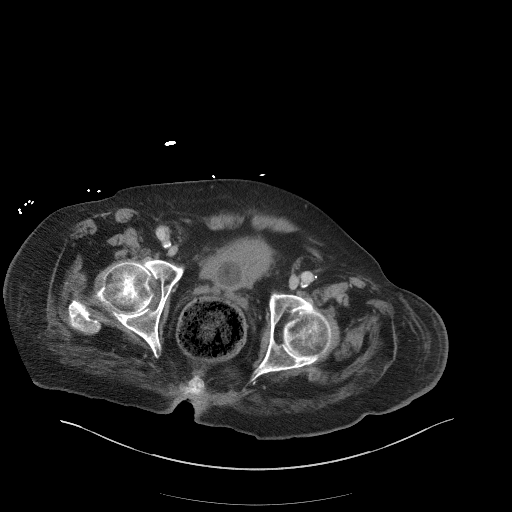
[im 27/105  soft-tissue]
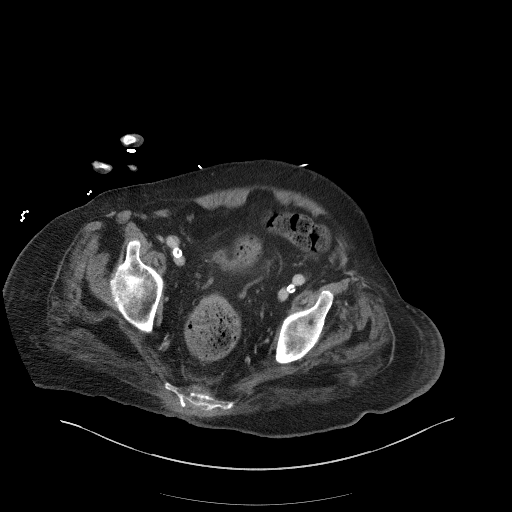
[im 37/105  soft-tissue]
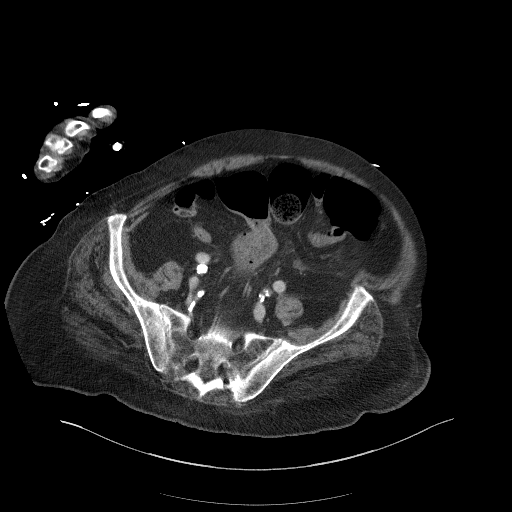
[im 42/105  soft-tissue]
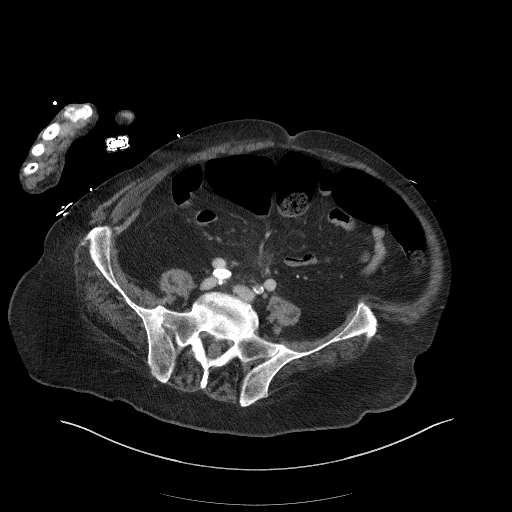
[im 47/105  soft-tissue]
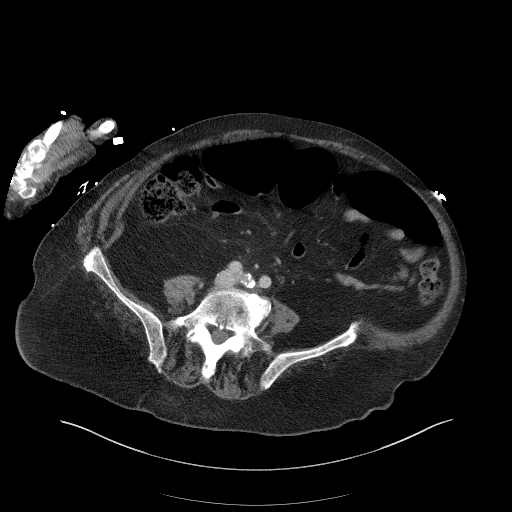
[im 58/105  soft-tissue]
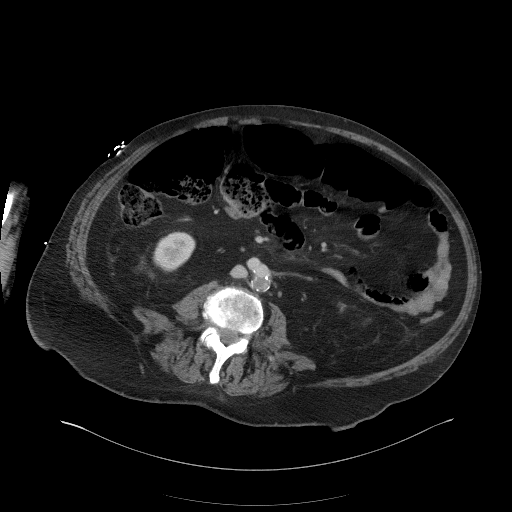
[im 63/105  soft-tissue]
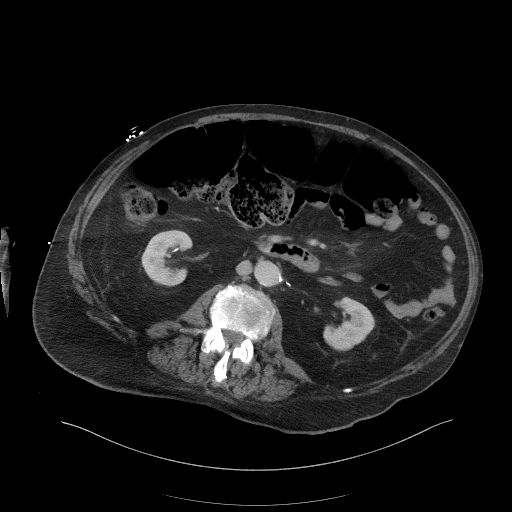
[im 63/105  bone]
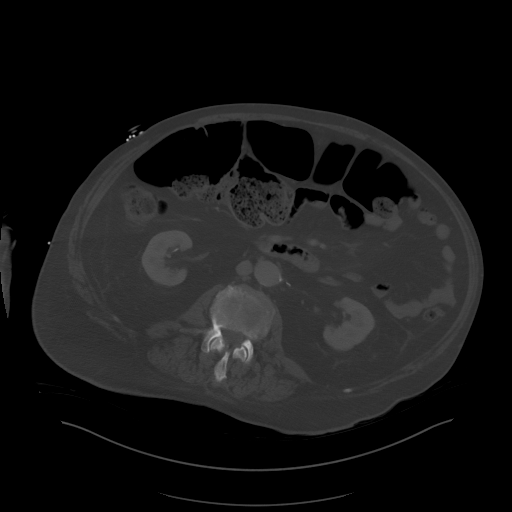
[im 68/105  soft-tissue]
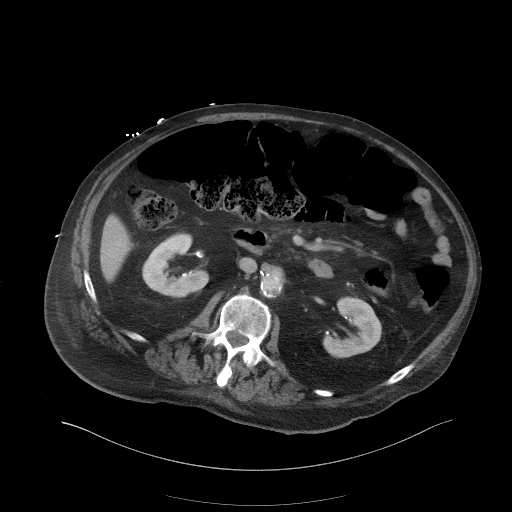
[im 79/105  soft-tissue]
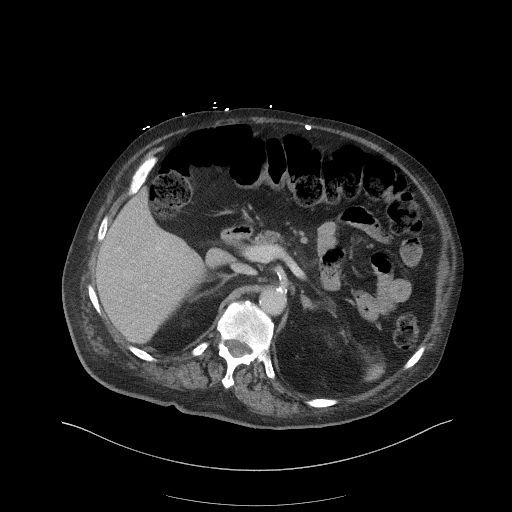
[im 84/105  soft-tissue]
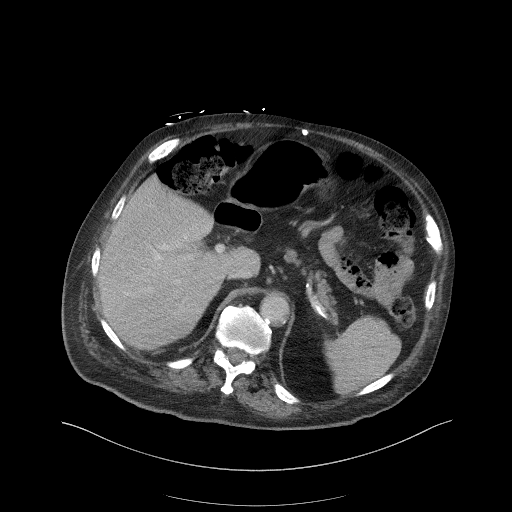
[im 89/105  soft-tissue]
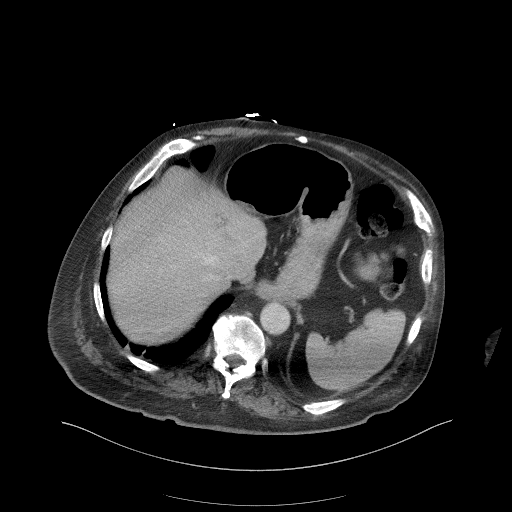
[im 99/105  soft-tissue]
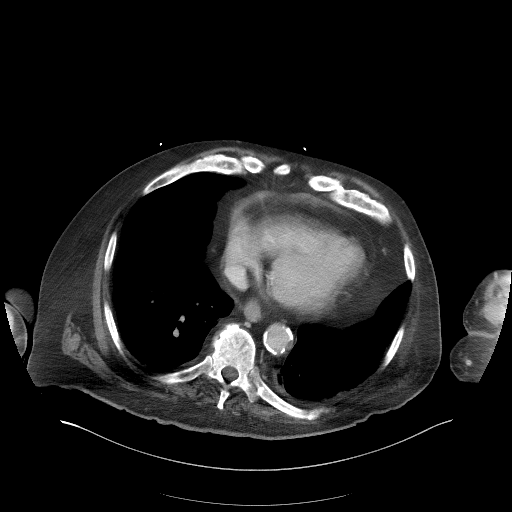

[Series 6: a/p w/ cor · coronal · 0.92mm/px · 3 of 151 slices shown]
[im 51/151  soft-tissue]
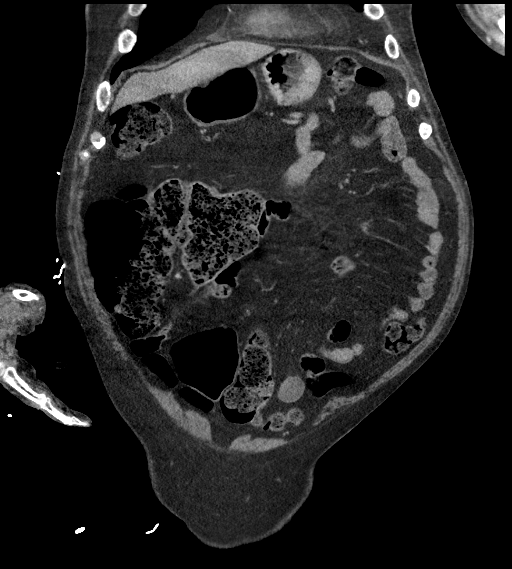
[im 67/151  soft-tissue]
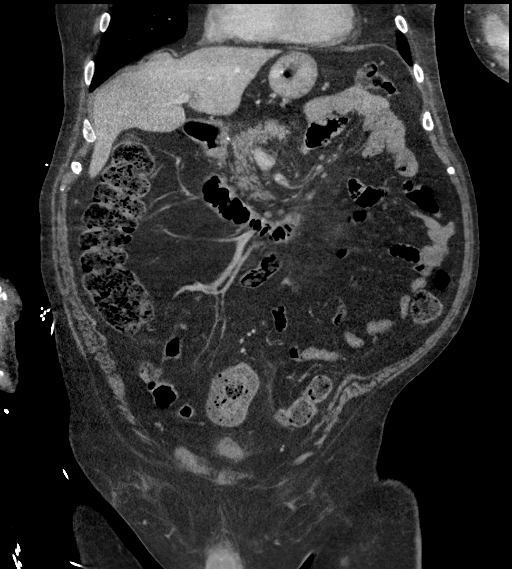
[im 84/151  soft-tissue]
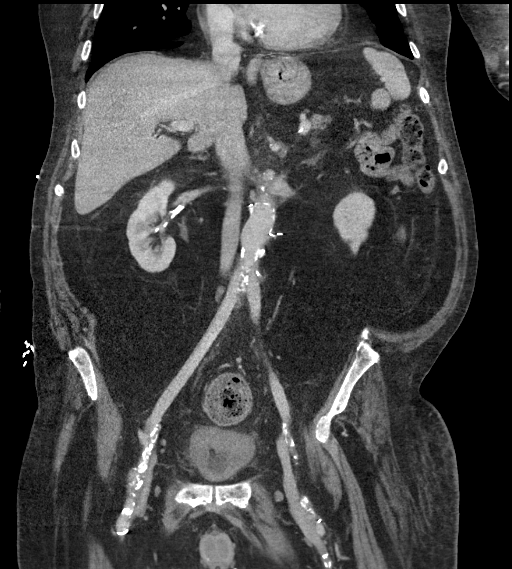

[17 of 46 positions shown; findings below may reference images not displayed]

FINDINGS: Lower chest: bibasilar atelectasis. Trace left pleural effusion.
Small hiatal hernia.

Hepatobiliary: No focal liver abnormality is seen. Status post
cholecystectomy. No biliary dilatation.

Pancreas: No focal abnormality or ductal dilatation.

Spleen: No focal abnormality.  Normal size.

Adrenals/Urinary Tract: Renovascular calcifications bilaterally. No
renal or ureteral stones. No hydronephrosis. Adrenal glands and
urinary bladder unremarkable. Foley catheter in place.

Stomach/Bowel: Normal appendix. Moderate stool burden and gas
throughout the colon.

Vascular/Lymphatic: Heavily calcified aorta and iliac vessels. Prior
aorto bi femoral bypass. No adenopathy.

Reproductive: No visible focal abnormality.

Other: No free fluid or free air.

Musculoskeletal: No acute bony abnormality.
IMPRESSION: No acute findings in the abdomen or pelvis.

Moderate stool burden throughout the colon.

Prior aortobifemoral bypass.

Small hiatal hernia.

## 2022-03-31 IMAGING — CT CT ANGIO CHEST
2 of 6 series · 17 of 46 positions shown · IV contrast (APPLIED)
Comparison: 04/12/2020

CLINICAL DATA: High probability pulmonary embolism, dyspnea

EXAM:
CT ANGIOGRAPHY CHEST WITH CONTRAST
TECHNIQUE: Multidetector CT imaging of the chest was performed using the
standard protocol during bolus administration of intravenous
contrast. Multiplanar CT image reconstructions and MIPs were
obtained to evaluate the vascular anatomy.
CONTRAST:  75mL OMNIPAQUE IOHEXOL 350 MG/ML SOLN

[Series 7: thins · axial · 0.83mm/px · z∈[-328,-78]mm · 14 of 391 slices shown]
[im 17/391  lung]
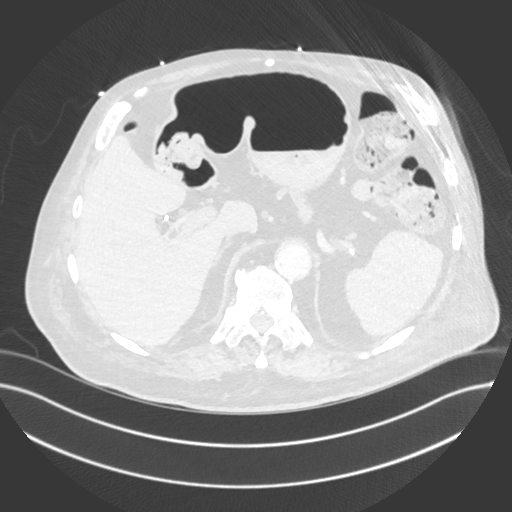
[im 51/391  soft-tissue]
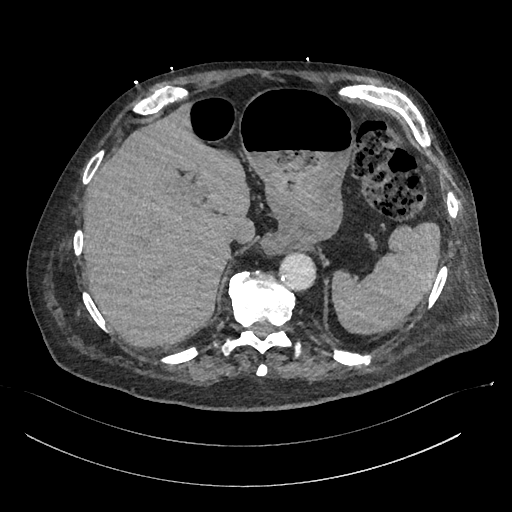
[im 68/391  lung]
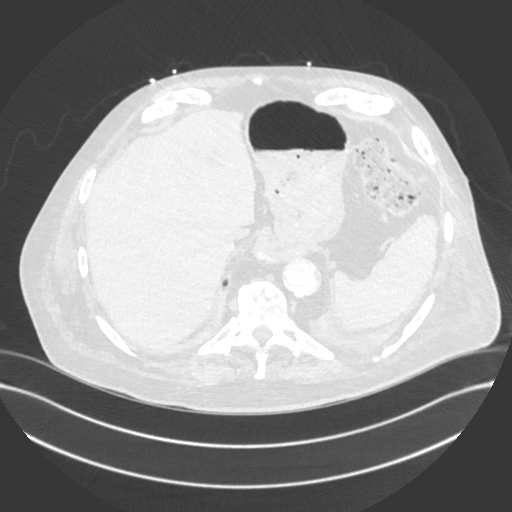
[im 102/391  soft-tissue]
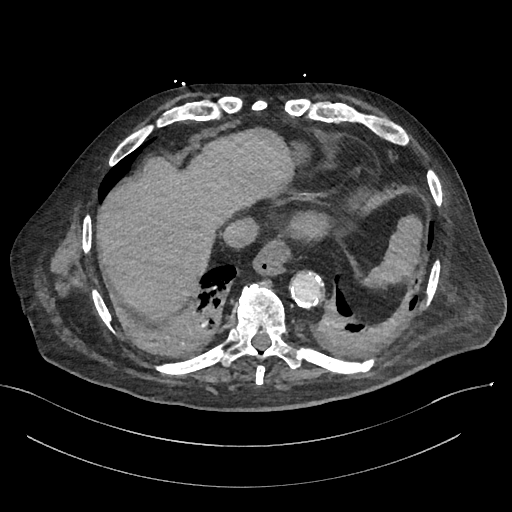
[im 136/391  lung]
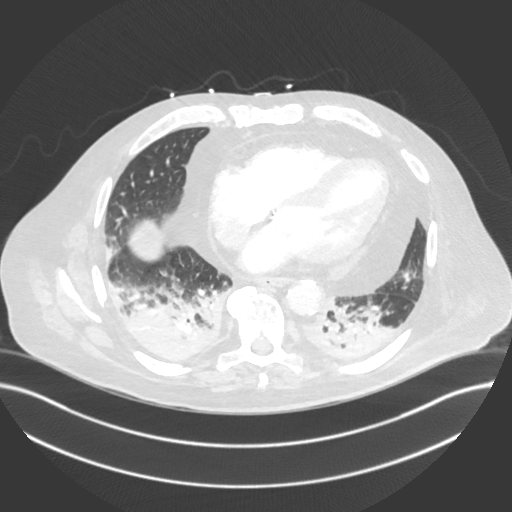
[im 153/391  soft-tissue]
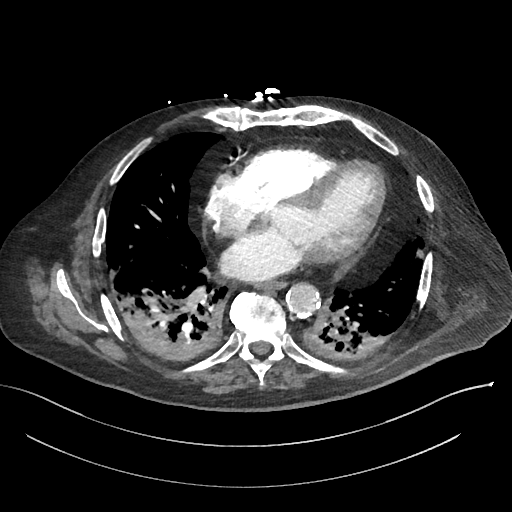
[im 187/391  lung]
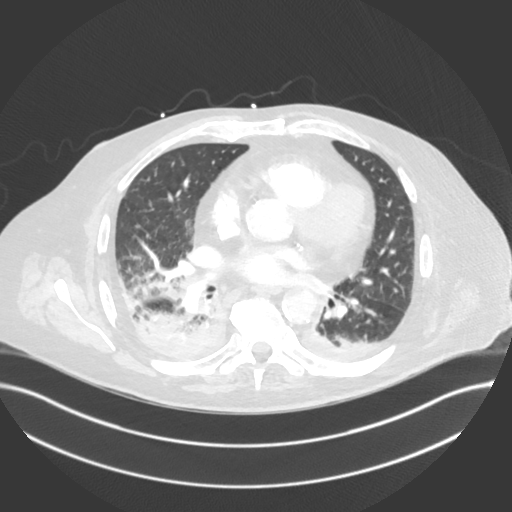
[im 204/391  soft-tissue]
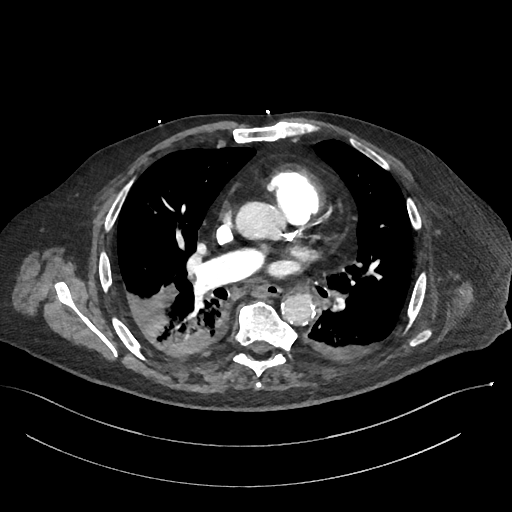
[im 238/391  lung]
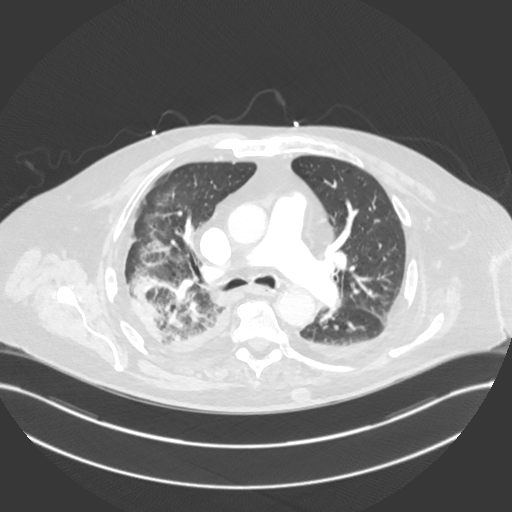
[im 255/391  soft-tissue]
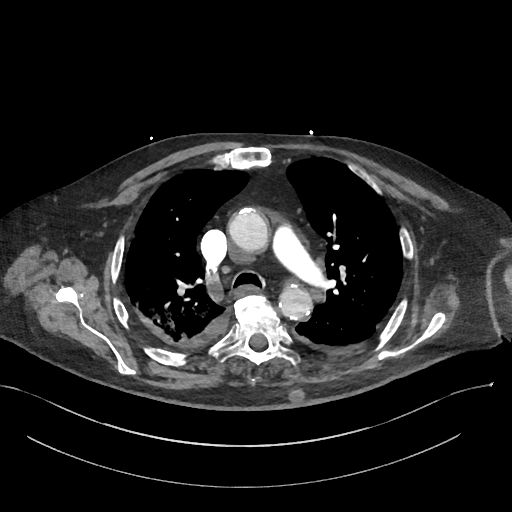
[im 289/391  lung]
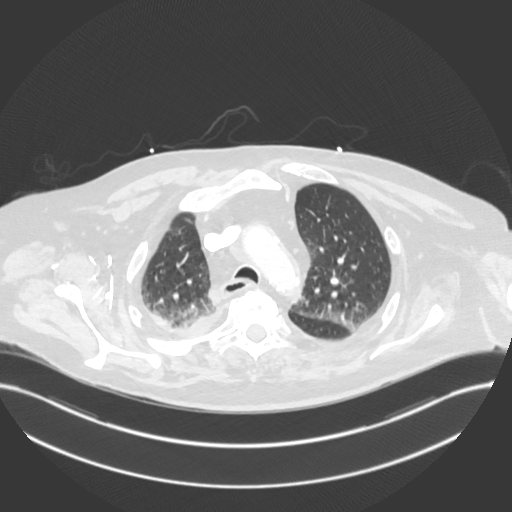
[im 323/391  soft-tissue]
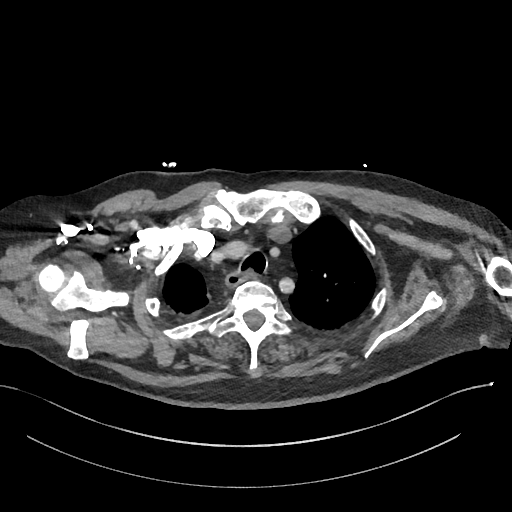
[im 340/391  lung]
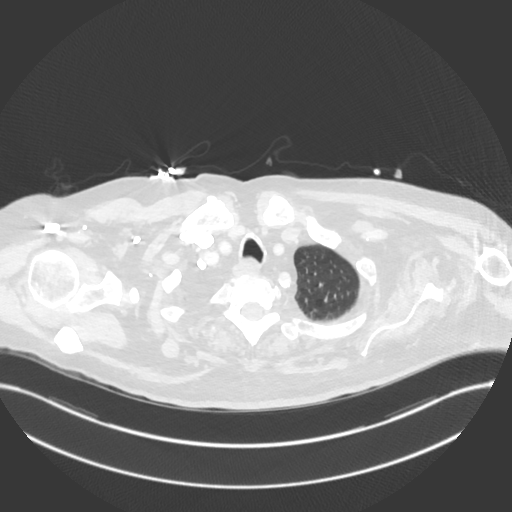
[im 374/391  soft-tissue]
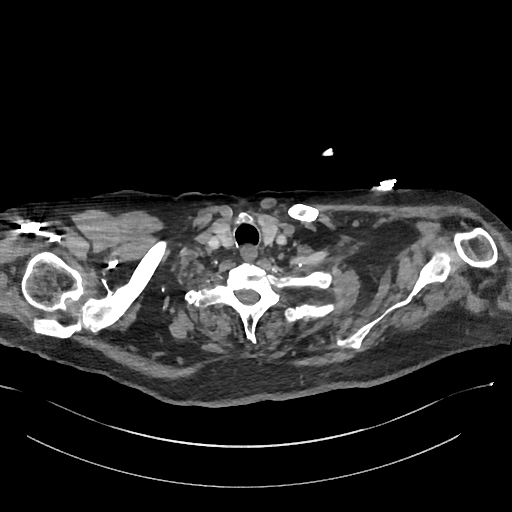

[Series 8: cor · coronal · 0.58mm/px · 3 of 152 slices shown]
[im 38/152  soft-tissue]
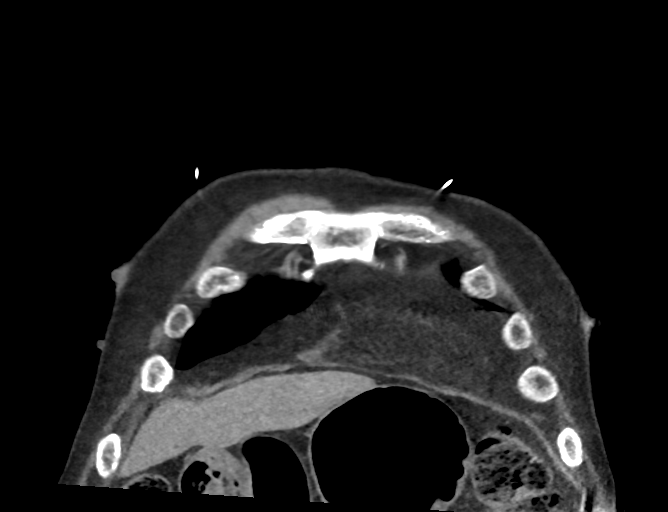
[im 76/152  soft-tissue]
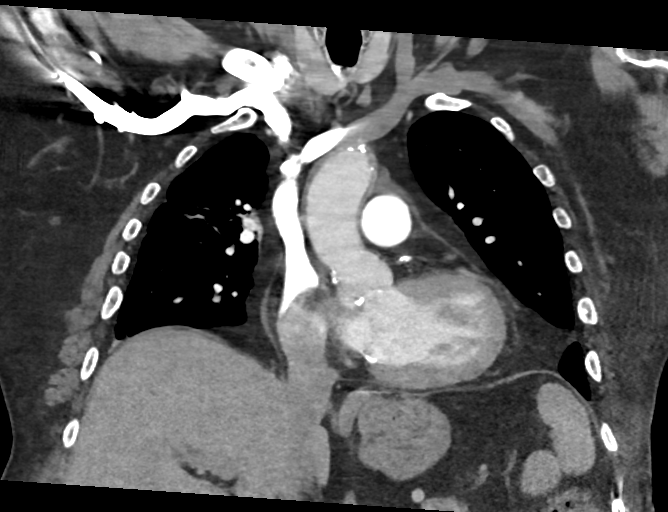
[im 114/152  soft-tissue]
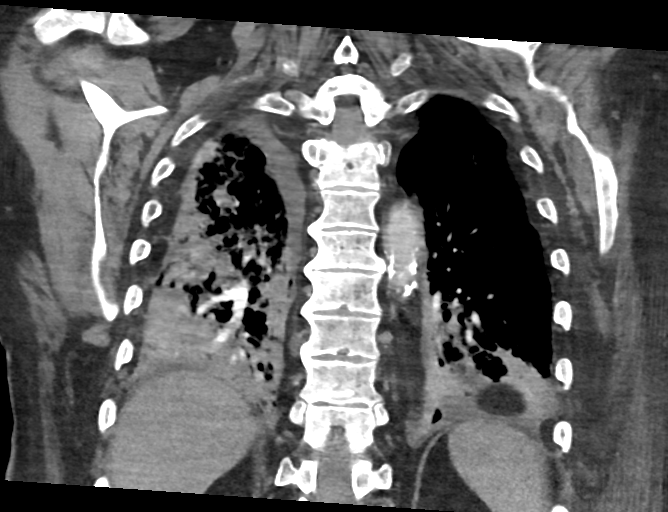

[17 of 46 positions shown; findings below may reference images not displayed]

FINDINGS: Cardiovascular: There is adequate opacification of the pulmonary
arterial tree. There is eccentric mural thrombus and synechia noted
within the left lower lobe segmental pulmonary arteries most in
keeping with chronic pulmonary embolism. There is, however, no
intraluminal filling defect to suggest acute pulmonary embolism. The
central pulmonary arteries are mildly enlarged, particularly on the
left, in keeping with changes of pulmonary arterial hypertension.

Extensive multi-vessel coronary artery calcification. Global cardiac
size is within normal limits. No pericardial effusion. Extensive
mixed atherosclerotic plaque is noted within the thoracic aorta. No
aortic aneurysm. Particularly prominent atherosclerotic plaque is
seen at the origin of the a innominate artery resulting in a greater
than 50% stenosis, better appreciated on prior examination of
04/12/2020.

Mediastinum/Nodes: The visualized thyroid is unremarkable. There is
progressive right hilar and right paratracheal adenopathy, possibly
reactive in nature. The esophagus is unremarkable. Small hiatal
hernia noted. Surgical changes of a gastric fundoplication are
identified.

Lungs/Pleura: Since the prior examination, there has developed
bilateral lower lobe consolidation, right greater than left, as well
as consolidation within the lateral segment of the right middle lobe
suspicious for aspiration. Acute infection could appear similarly.
Trace left pleural effusion is present, improved since prior
examination. Parenchymal scarring within the right upper lobe with
mild volume loss is unchanged. No pneumothorax. No central
obstructing lesion.

Upper Abdomen: Cholecystectomy has been performed. No acute
abnormality.

Musculoskeletal: Asymmetric sclerosis of the a left posterior
seventh rib again noted possibly related to Paget's disease. No
acute bone abnormality.

Review of the MIP images confirms the above findings.
IMPRESSION: No acute pulmonary embolism.

Sequela of a chronic pulmonary embolism with multiple synechia and
eccentric mural thrombus within the left lower lobe. Morphologic
changes in keeping with pulmonary arterial hypertension, more
prominent involving the left pulmonary artery.

Interval development of bibasilar consolidation suspicious for
changes of aspiration. Acute infection could appear similarly.
Associated shotty right hilar and right paratracheal adenopathy is
likely reactive in nature.

Extensive multi-vessel coronary artery calcification.

Peripheral vascular disease with greater than 50% stenosis of the
innominate artery.

Aortic Atherosclerosis ([id number].U).

## 2022-03-31 IMAGING — DX DG ABDOMEN 1V
1 series · 1 of 1 positions shown · non-contrast
Comparison: CT 05/29/2020.

CLINICAL DATA: Abdominal pain and bloating. Evaluation for
constipation.

EXAM:
ABDOMEN - 1 VIEW

[abdomen kub]
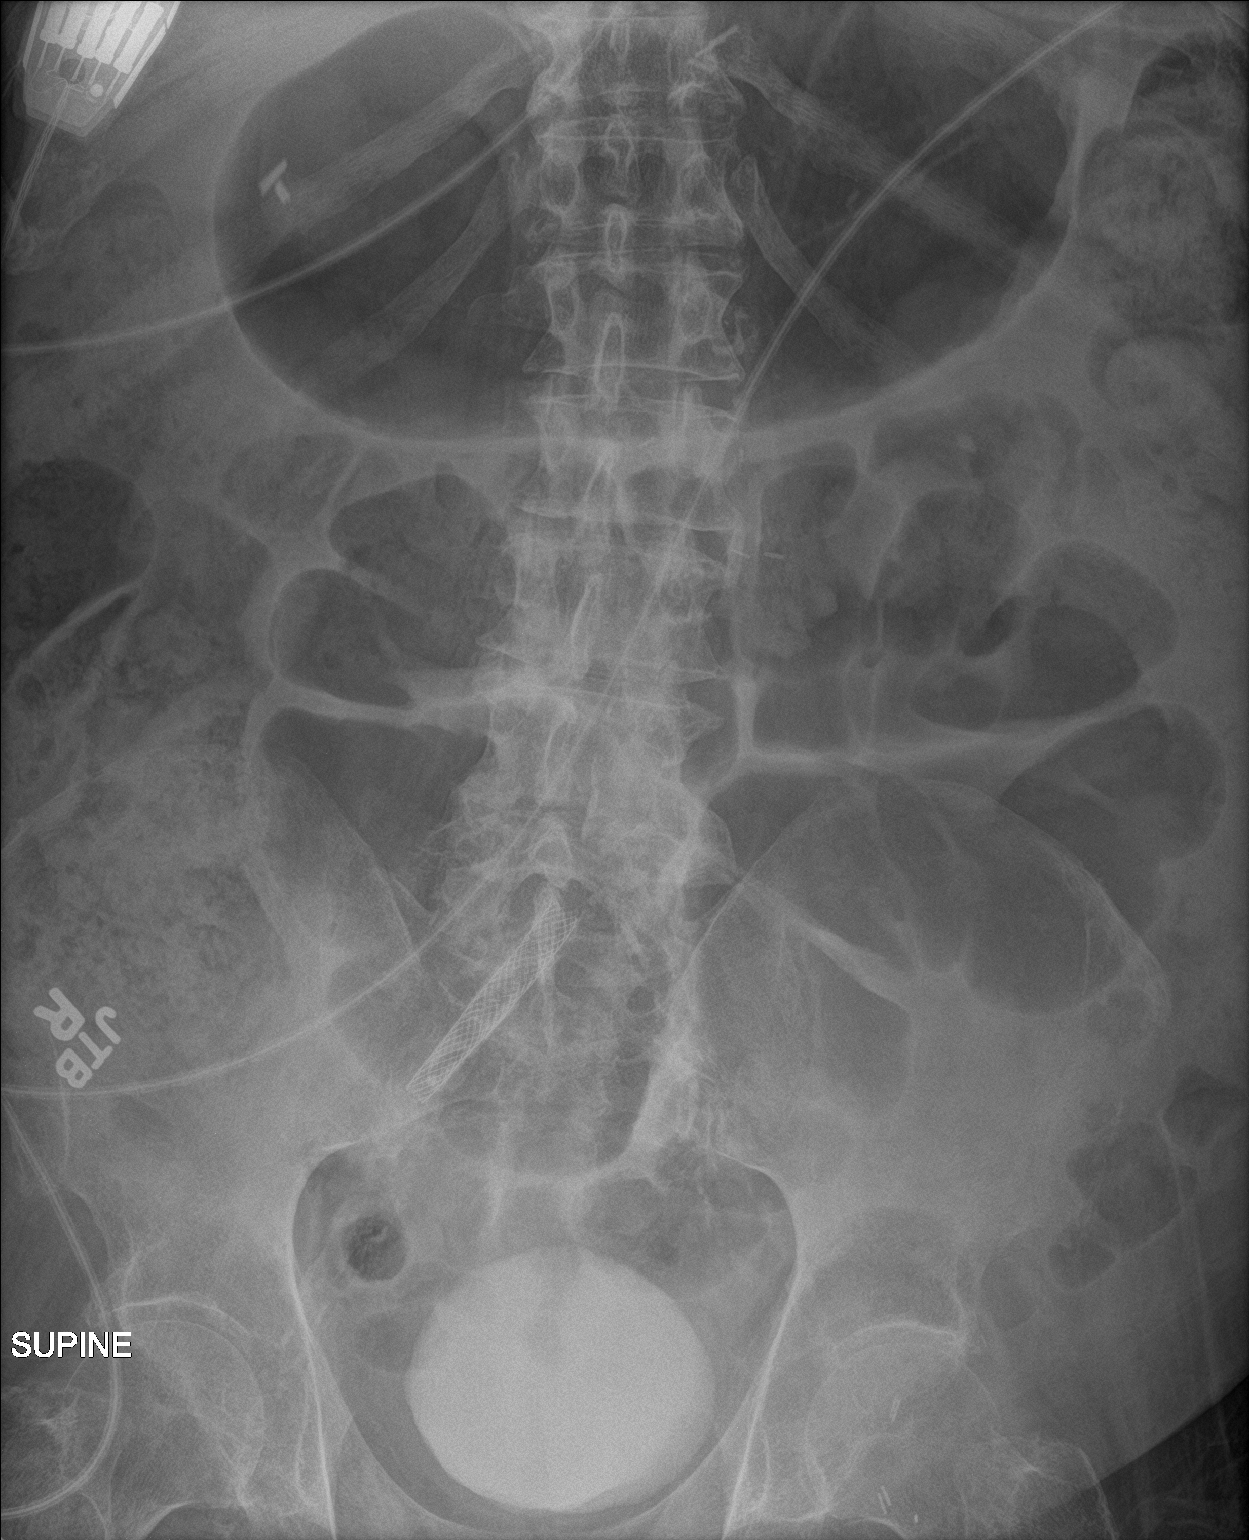

[1 of 1 positions shown; findings below may reference images not displayed]

FINDINGS: Gastric distention noted. Colonic distention noted. Moderate stool
volume in the colon. No free air. Surgical clips right upper
quadrant. Right iliac stent noted. Contrast in the bladder.
Degenerative changes lumbar spine and both hips.
IMPRESSION: 1.  Gastric distention.

2. Moderate colonic distention. Colonic ileus could present this
fashion. Follow-up exam suggested to demonstrate resolution and to
exclude obstruction. No free air.

## 2022-04-04 IMAGING — DX DG ABDOMEN 1V
4 series · 4 of 4 positions shown · non-contrast
Comparison: Radiograph 06/27/2020, CT 05/29/2020

CLINICAL DATA: Abdominal distension, diarrhea, shortness of breath
and productive cough for several weeks

EXAM:
ABDOMEN - 1 VIEW

[abdomen kub (1 of 4)]
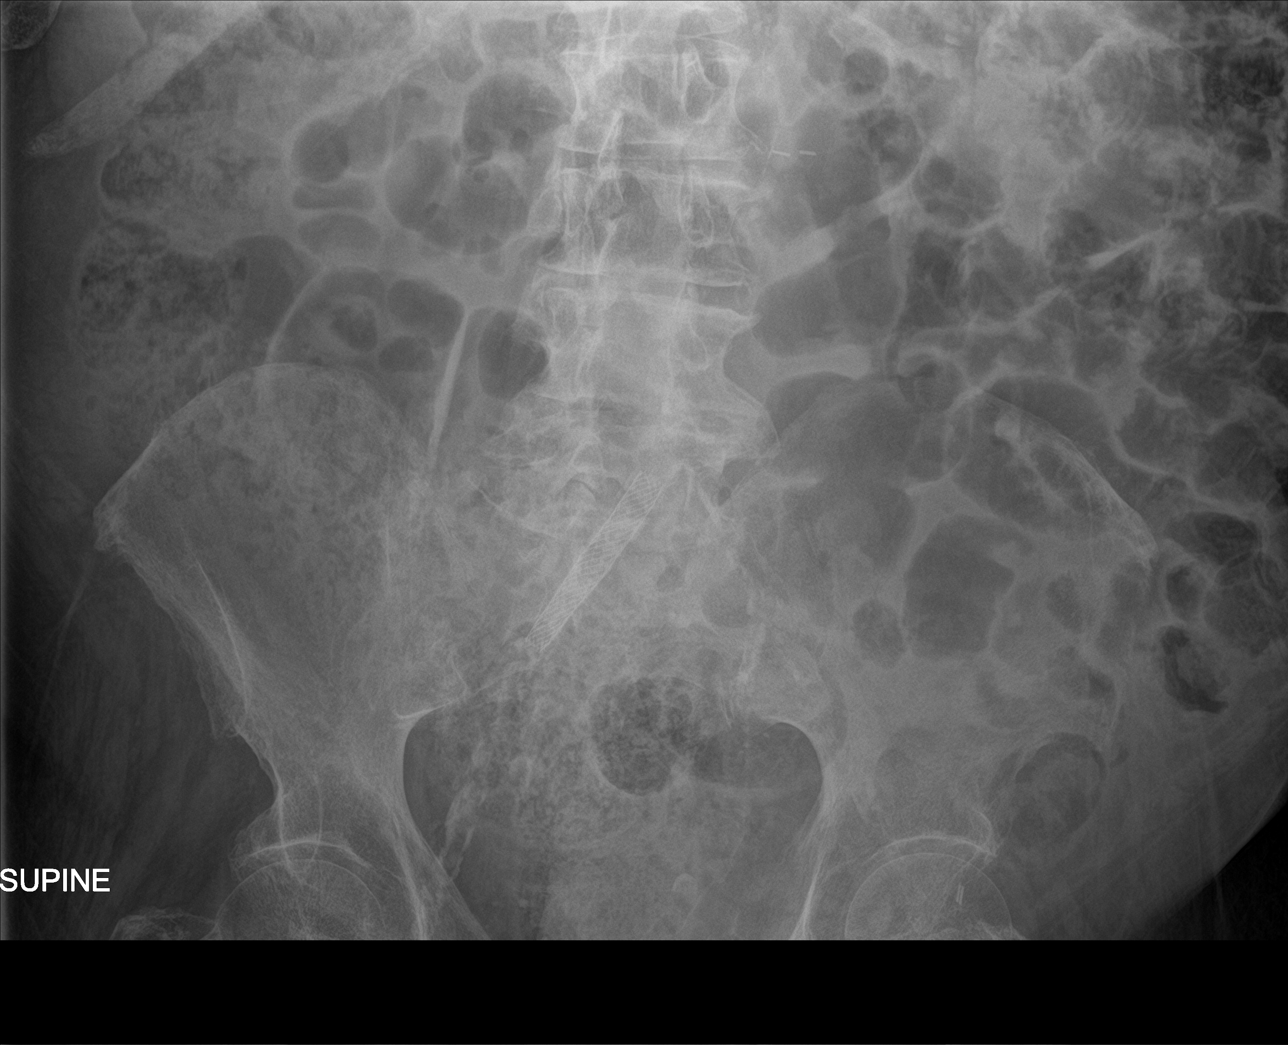

[abdomen kub (2 of 4)]
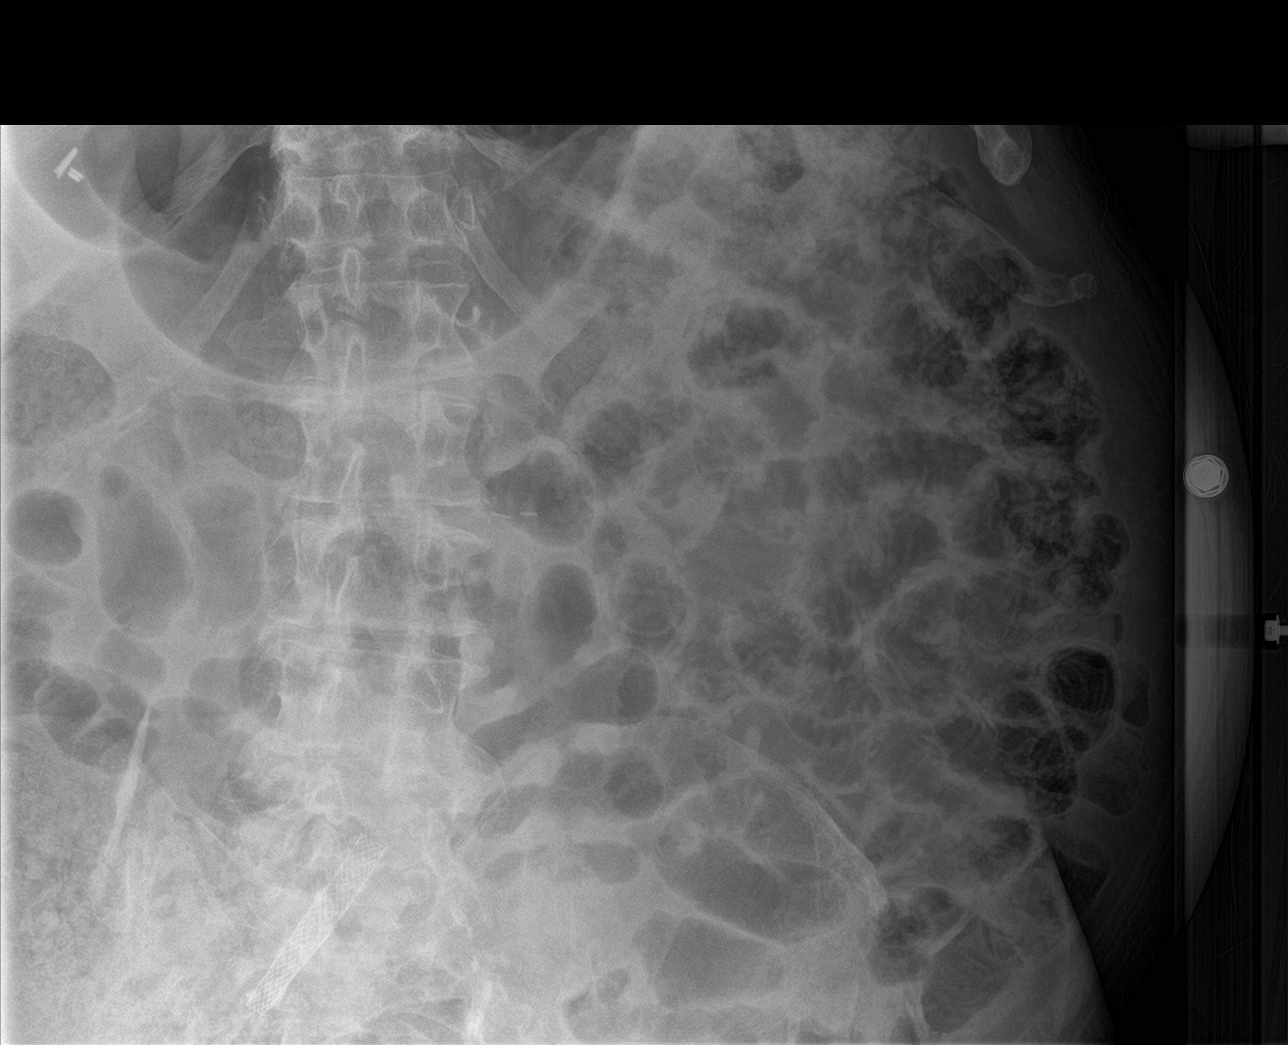

[abdomen kub (3 of 4)]
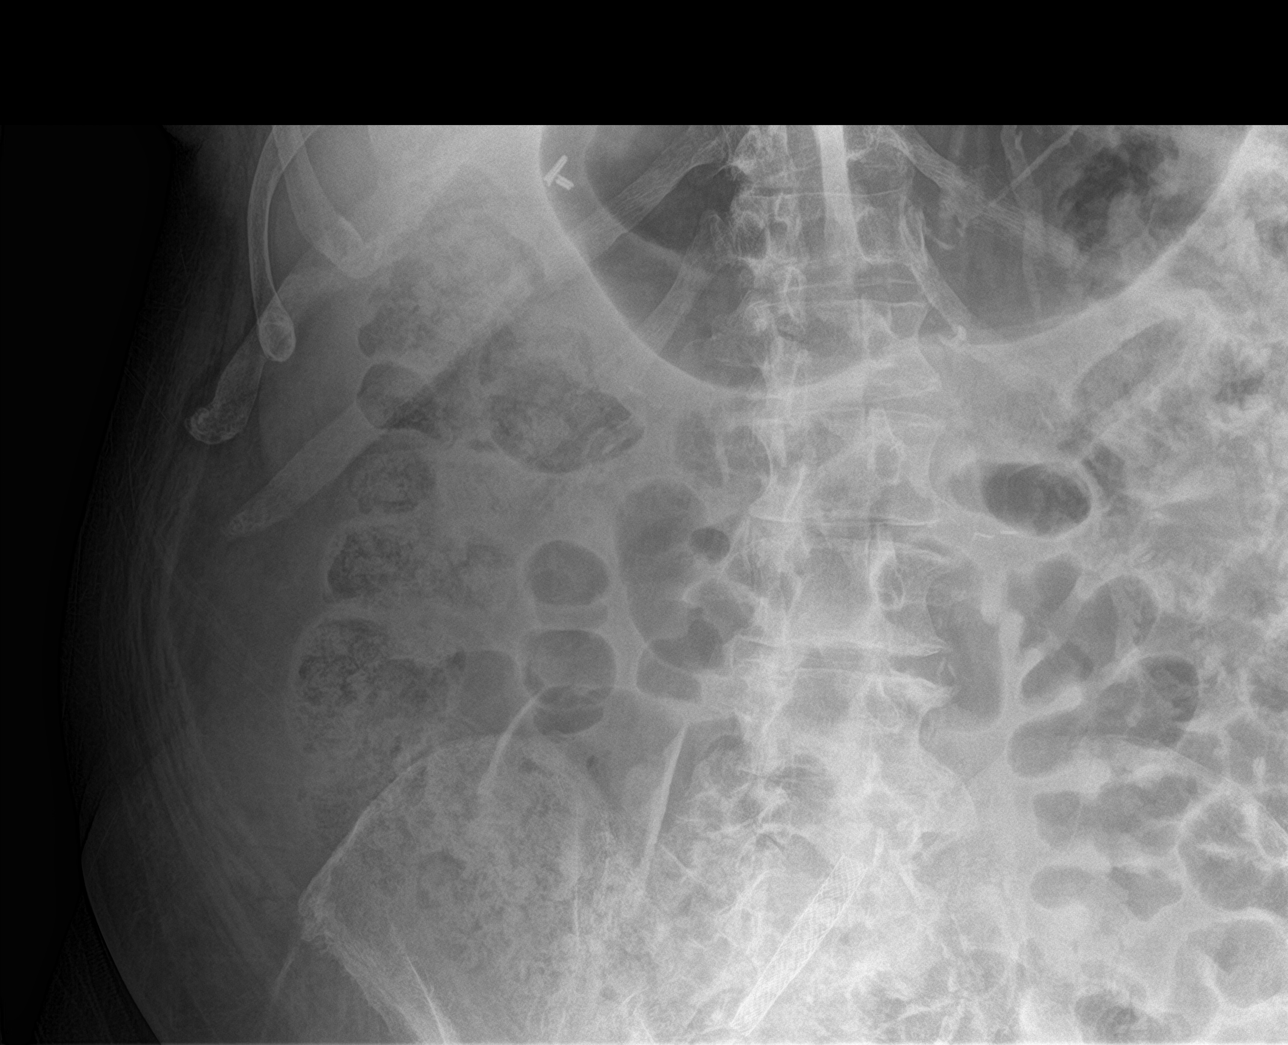

[abdomen kub (4 of 4)]
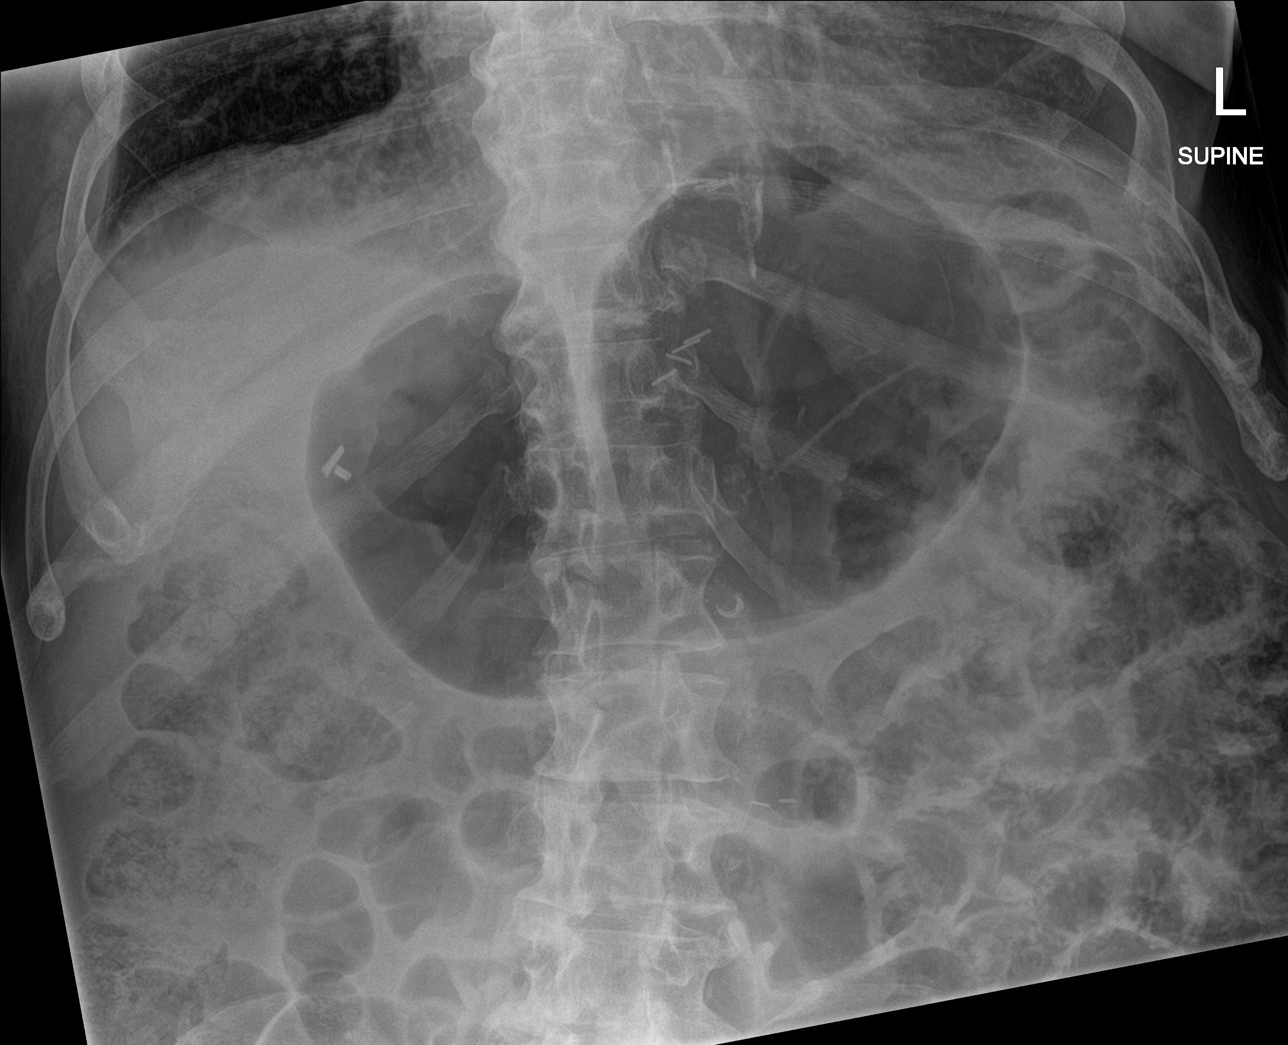

[4 of 4 positions shown; findings below may reference images not displayed]

FINDINGS: Large colonic stool burden is noted. Additionally, there are
numerous air-filled loops of nondistended small bowel throughout the
abdomen though there may be some fold thickening of the jejunal
loops in the left upper quadrant which could be seen in the setting
of an enteritis. Stomach is distended with air without visible
nasogastric decompression in place. Surgical clips noted in the
upper abdomen. Vascular stent in the expected location of right
iliac artery. Multiple surgical clips project over the left hip as
well. Multilevel degenerative changes in the spine hips and pelvis
unchanged comparison.
IMPRESSION: Large colonic stool burden, correlate for features of
constipation/slowed intestinal transit.

Nonspecific diffusely air-filled appearance of much of the small
bowel albeit with some questionable fold thickening of the jejunal
loops which could be seen in the setting of an enteritis.

Air distention of the stomach, consider nasogastric decompression.

Postsurgical changes in the upper abdomen and left hip.

Aortic Atherosclerosis (84X03-[id number].S). Vascular stenting right iliac
artery.

## 2022-04-04 IMAGING — DX DG CHEST 1V
2 series · 2 of 2 positions shown · non-contrast
Comparison: Radiograph 06/26/2021, CT 06/27/2020, contemporary
abdominal radiograph

CLINICAL DATA: Abdominal distension, diarrhea, shortness of breath
and productive cough for several weeks

EXAM:
CHEST  1 VIEW

[chest ap (1 of 2)]
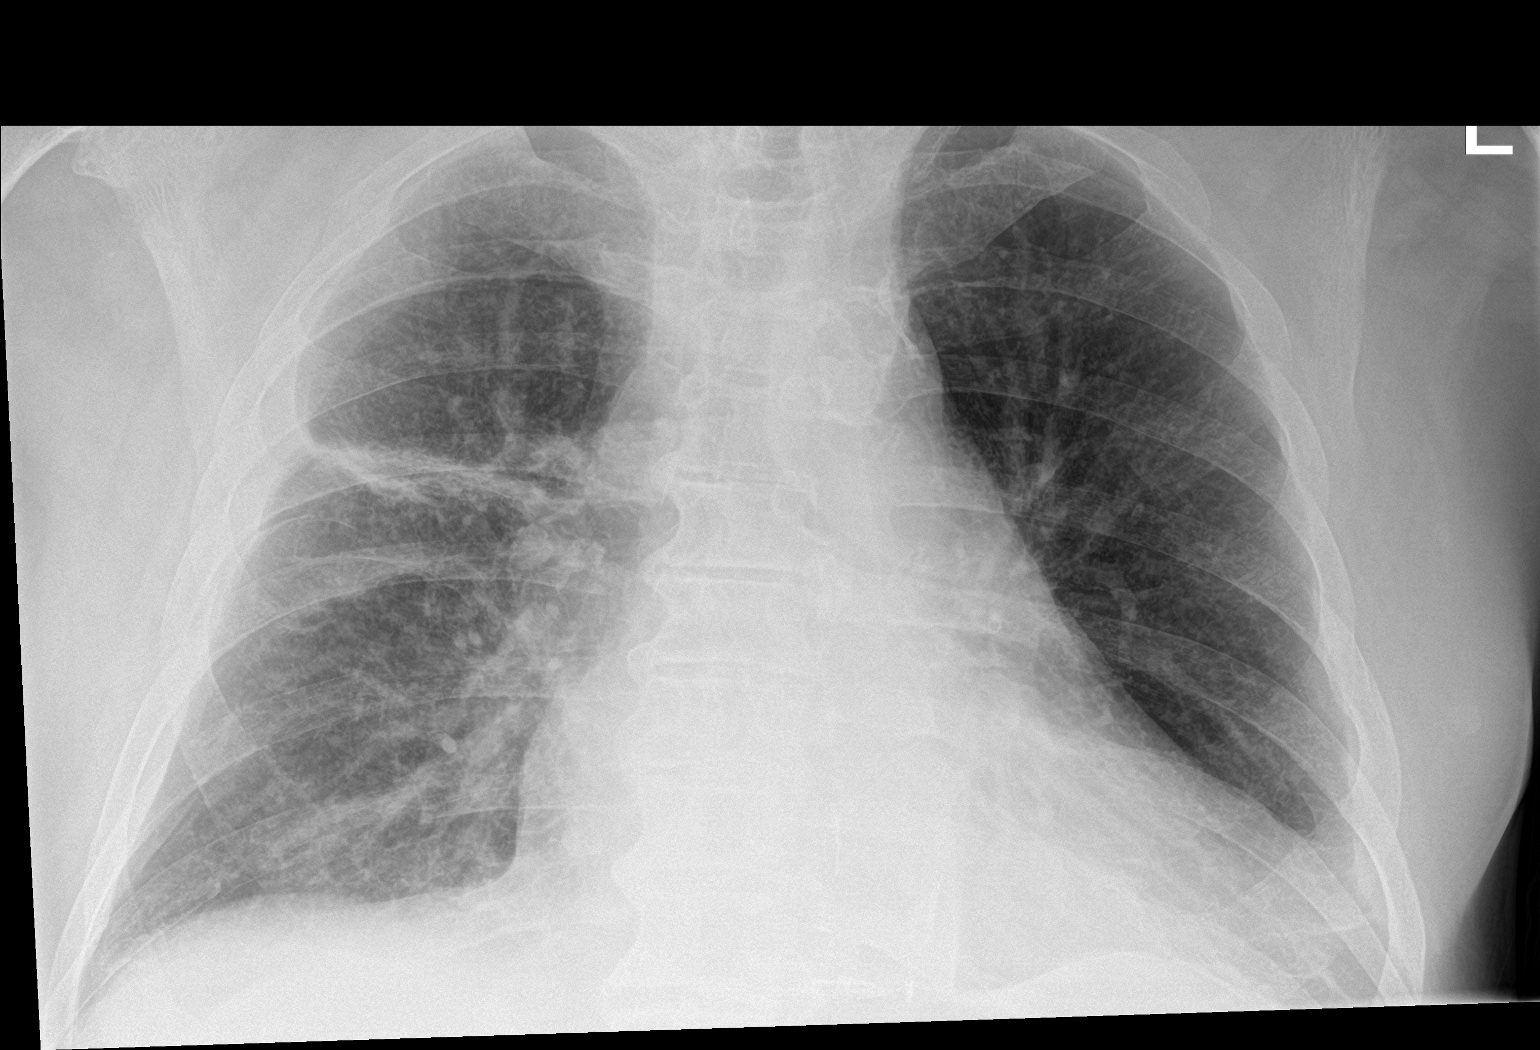

[chest ap (2 of 2)]
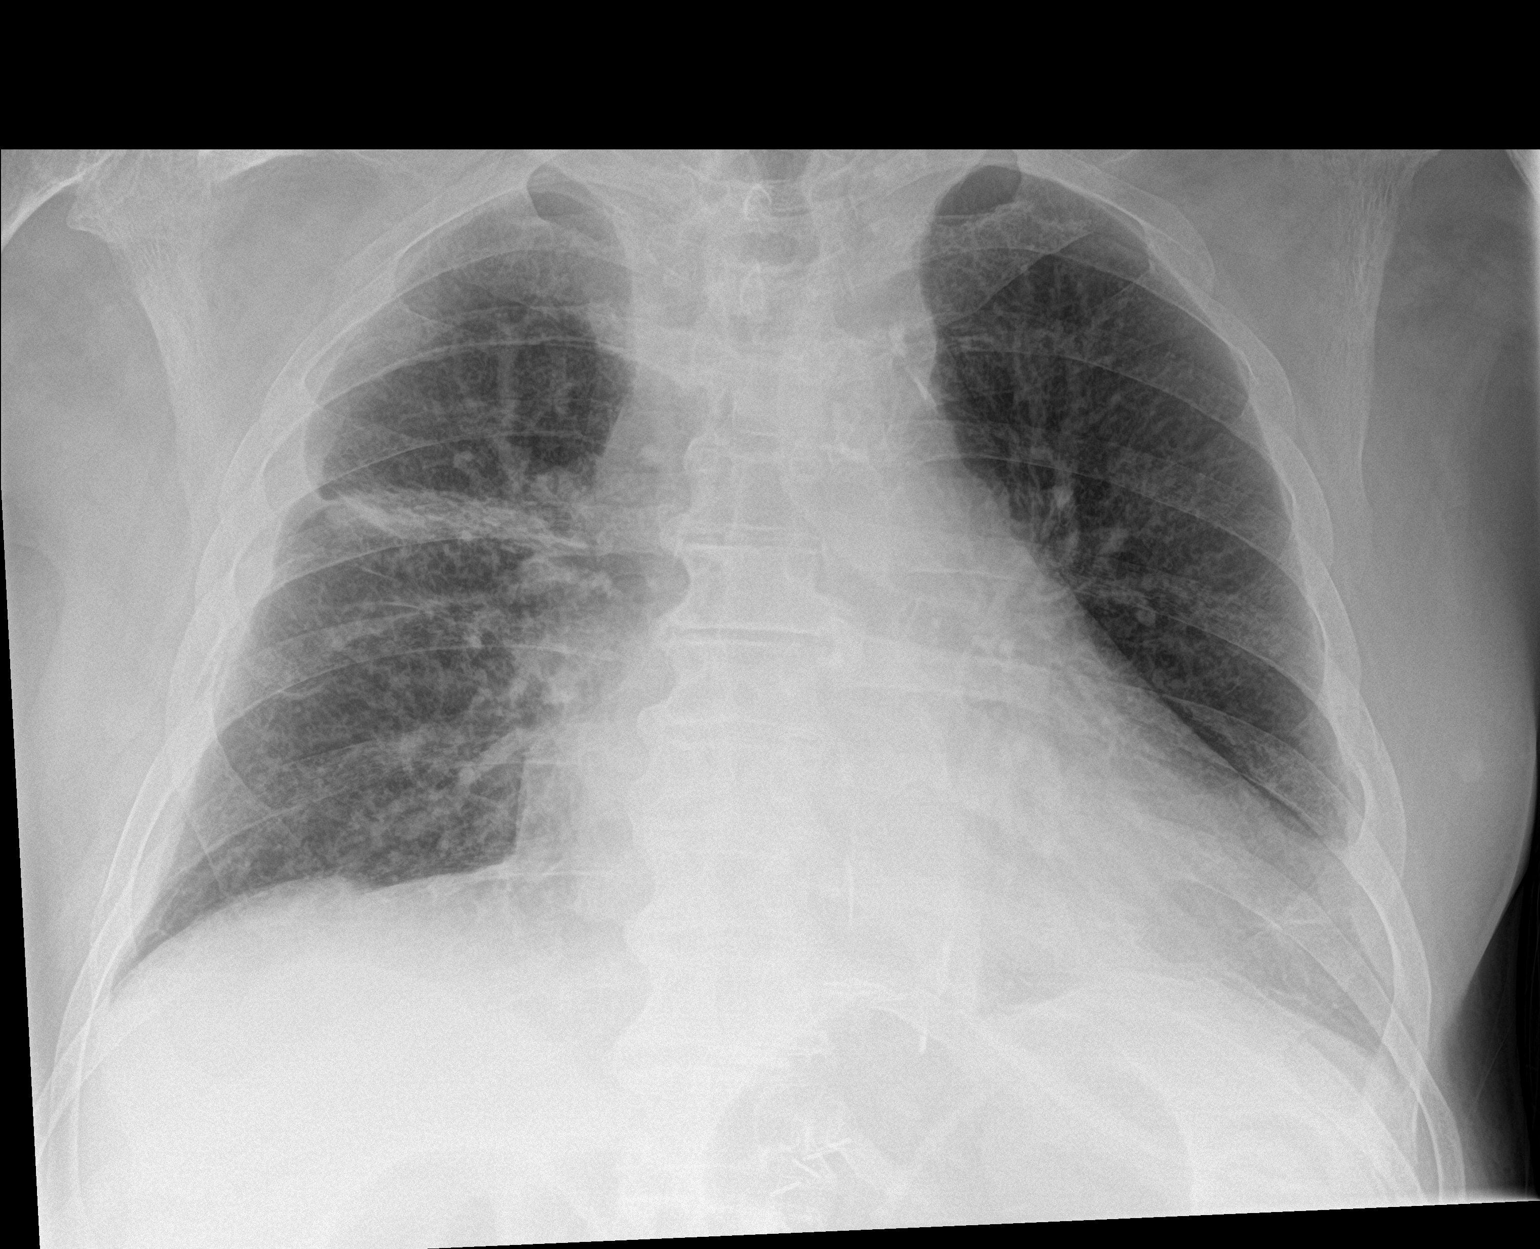

[2 of 2 positions shown; findings below may reference images not displayed]

FINDINGS: Persistent opacities most pronounced in the right mid to lower lung
albeit with some persistent density in the left lung base as well.
Overall, some interval clearing is noted in the right lower lung.
Persistent bilateral pleural thickening, much of which could relate
to abundant subpleural fat seen on comparison cross-sectional
imaging though some small layering effusion is not fully excluded.
Cardiomegaly is similar to comparison with a calcified tortuous
aorta. Surgical clips noted at the gastric cardia. Air distended
appearance of the stomach partially visualized on this exam.
Cervical carotid atherosclerosis.
IMPRESSION: Persistent bibasilar opacities, right greater than left albeit with
some interval clearing from comparison exam.

More bandlike region of scarring in the right mid lung.

Persistent bilateral pleural thickening, right greater than left,
much of which is likely a abundant subpleural fat seen on comparison
CT though trace effusion not excluded.

Aortic Atherosclerosis ([id number]-PVX.X).

Stable cardiomegaly.

Gastric air distension, see contemporary abdominal radiograph.

## 2022-04-07 IMAGING — DX DG ABDOMEN 1V
4 series · 4 of 4 positions shown · non-contrast
Comparison: 07/01/2020.  CT 05/29/2020.

CLINICAL DATA: Abdominal distention.

EXAM:
ABDOMEN - 1 VIEW

[abdomen kub (1 of 4)]
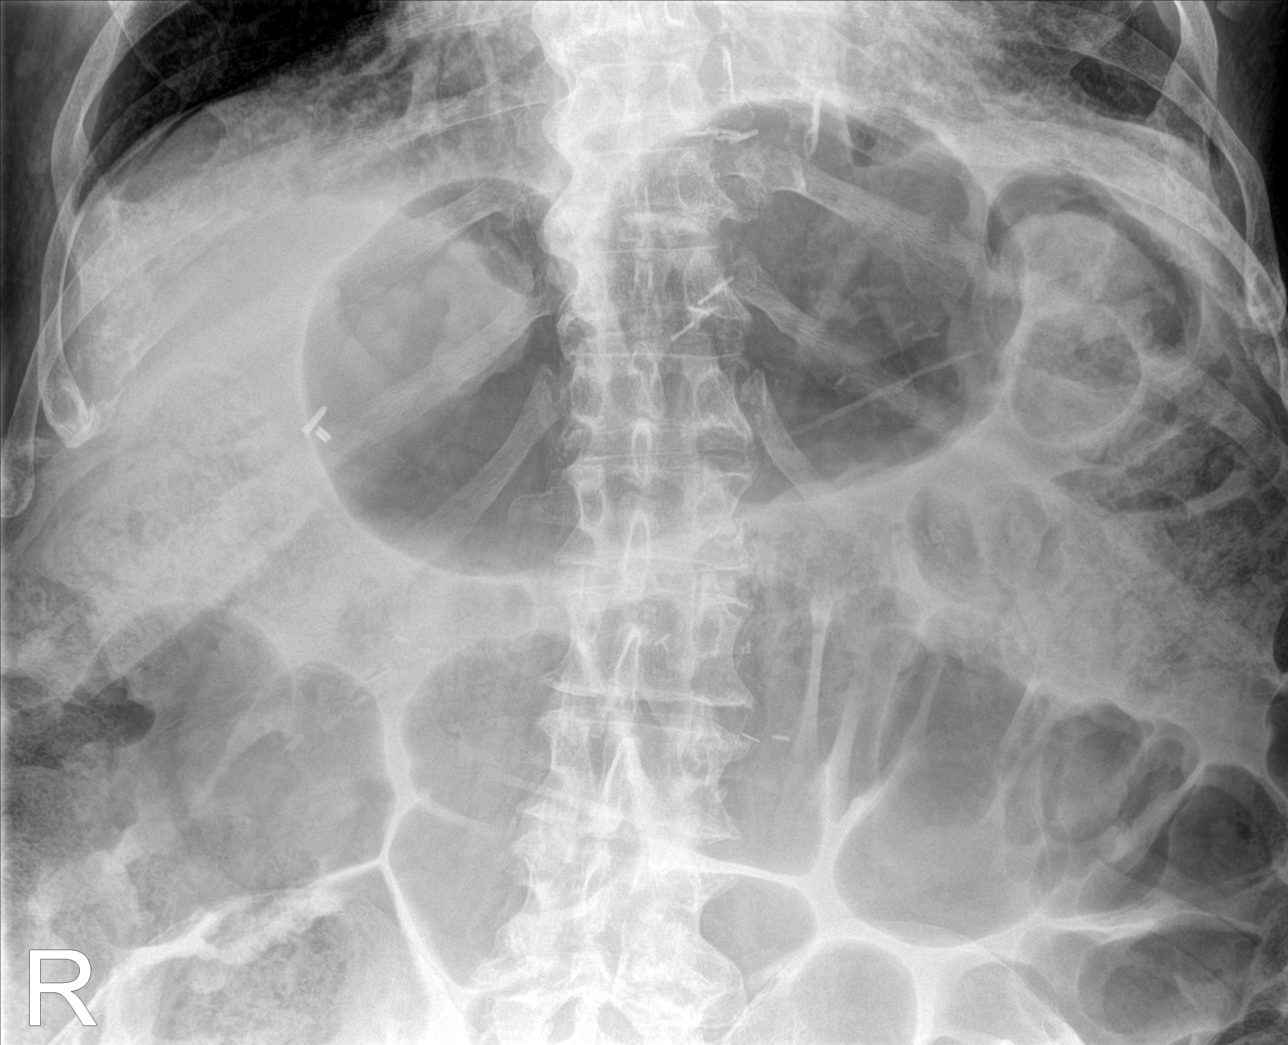

[abdomen kub (2 of 4)]
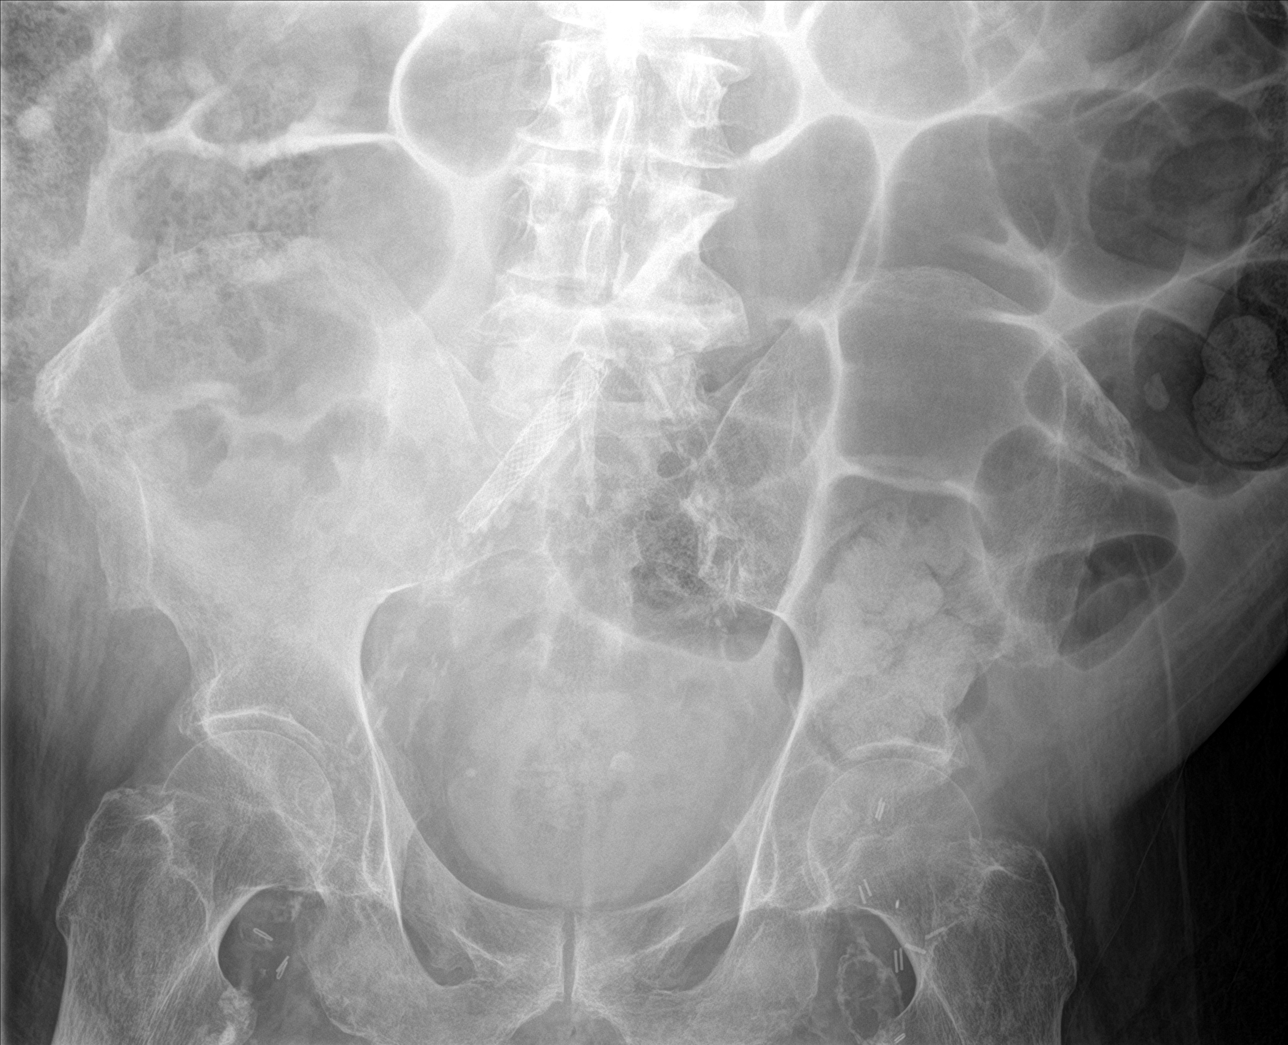

[abdomen kub (3 of 4)]
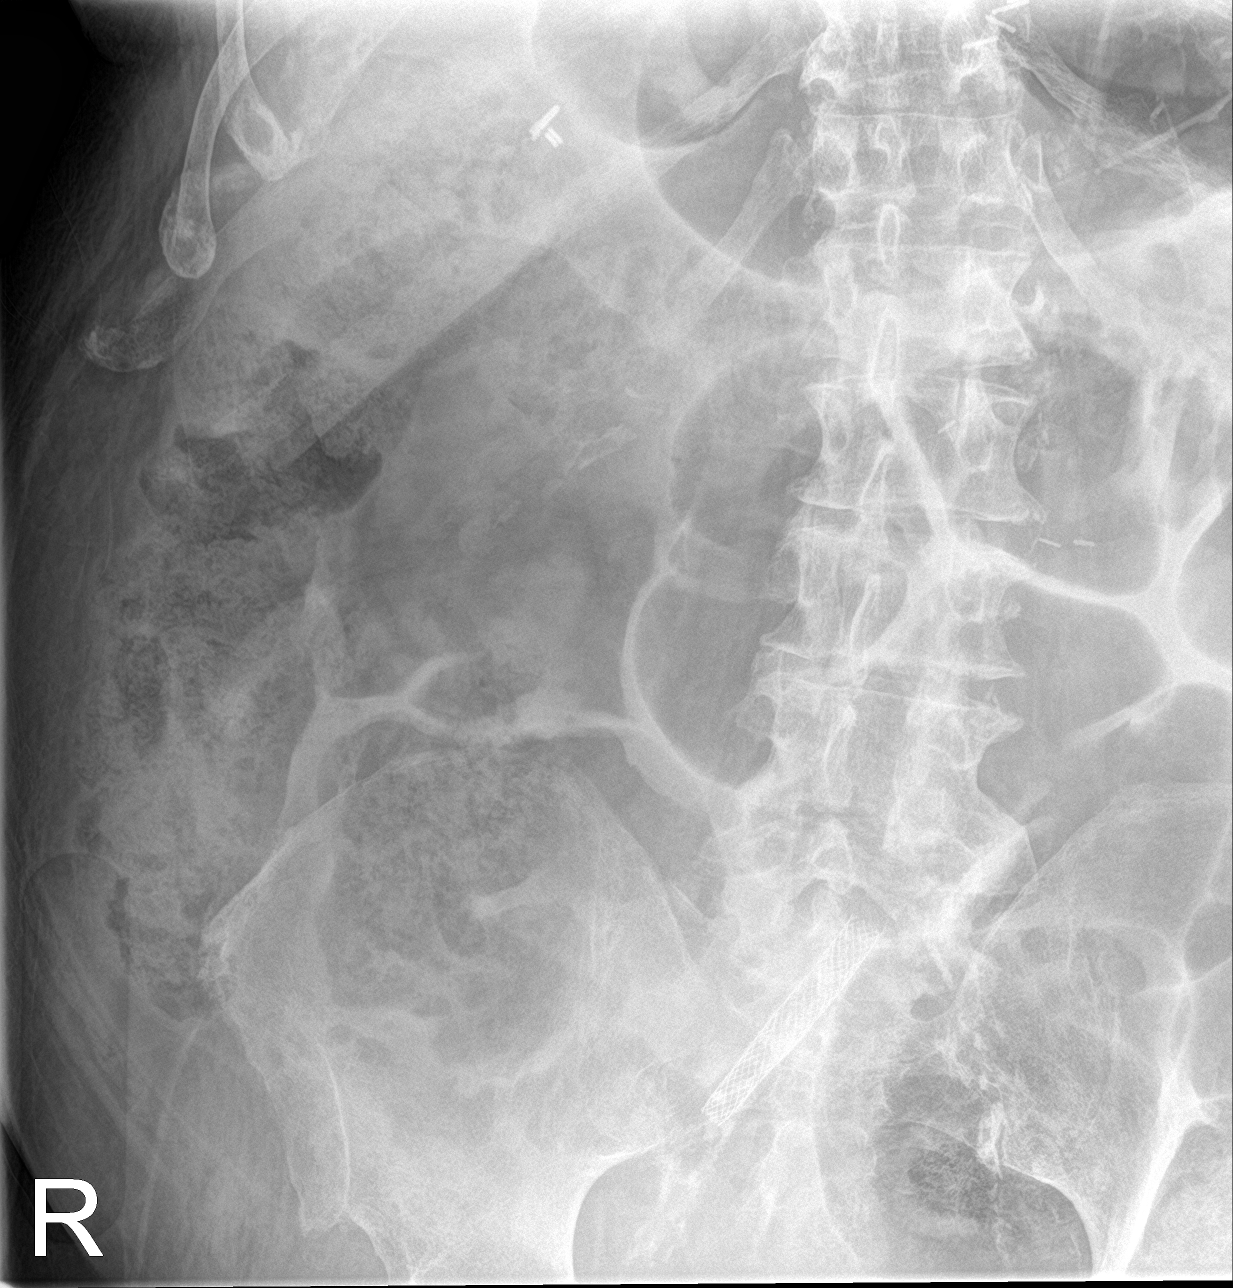

[abdomen kub (4 of 4)]
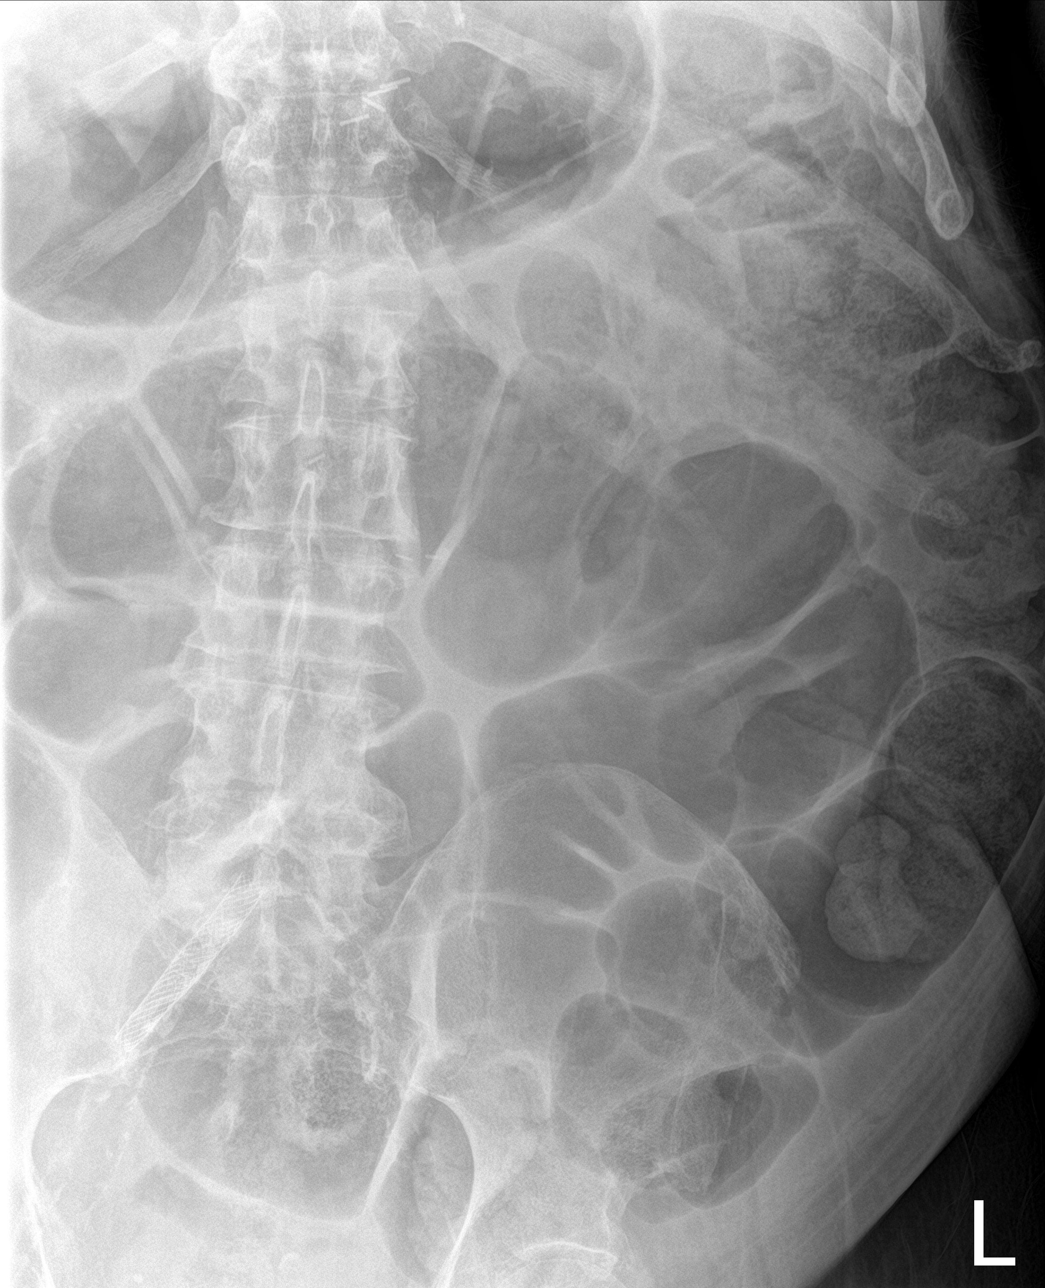

[4 of 4 positions shown; findings below may reference images not displayed]

FINDINGS: Surgical clips noted in the upper abdomen and over both inguinal
regions. Right iliac stent again noted. Aortoiliac atherosclerotic
vascular calcification. Large amount of stool again noted in the
colon and rectum suggesting constipation. Moderate colonic
distention noted on today's exam. No small bowel distention. Mild
gastric distention remains. No free air. Bibasilar atelectasis.
Degenerative changes lumbar spine and both hips.
IMPRESSION: Large amount of stool again noted throughout the colon and rectum.
Constipation could present this fashion. There is associated
moderate colonic distention noted on today's exam. Follow-up exam
suggested to demonstrate resolution in order to exclude colonic
obstruction. No small bowel distention noted. Mild gastric
distention again noted.

## 2022-04-07 IMAGING — DX DG SHOULDER 2+V*L*
3 series · 3 of 3 positions shown · non-contrast
Comparison: 11/29/2017.

CLINICAL DATA: Left shoulder pain.  Limited range of motion.

EXAM:
LEFT SHOULDER - 2+ VIEW

[shoulder ap (1 of 2)]
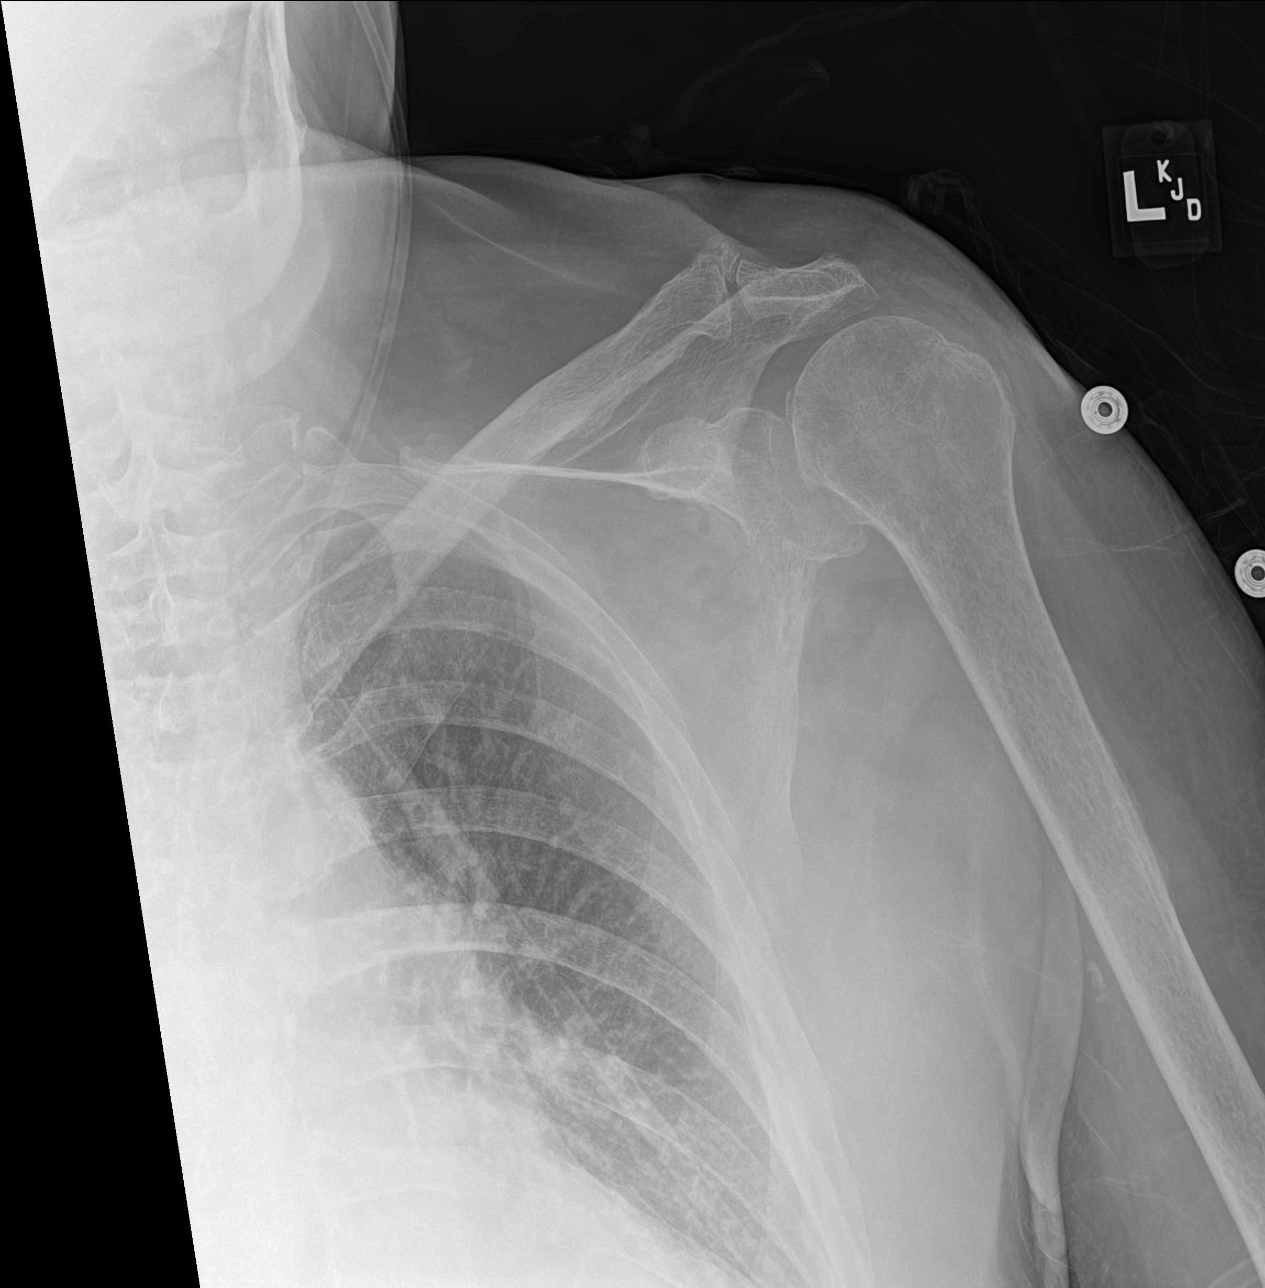

[shoulder obl]
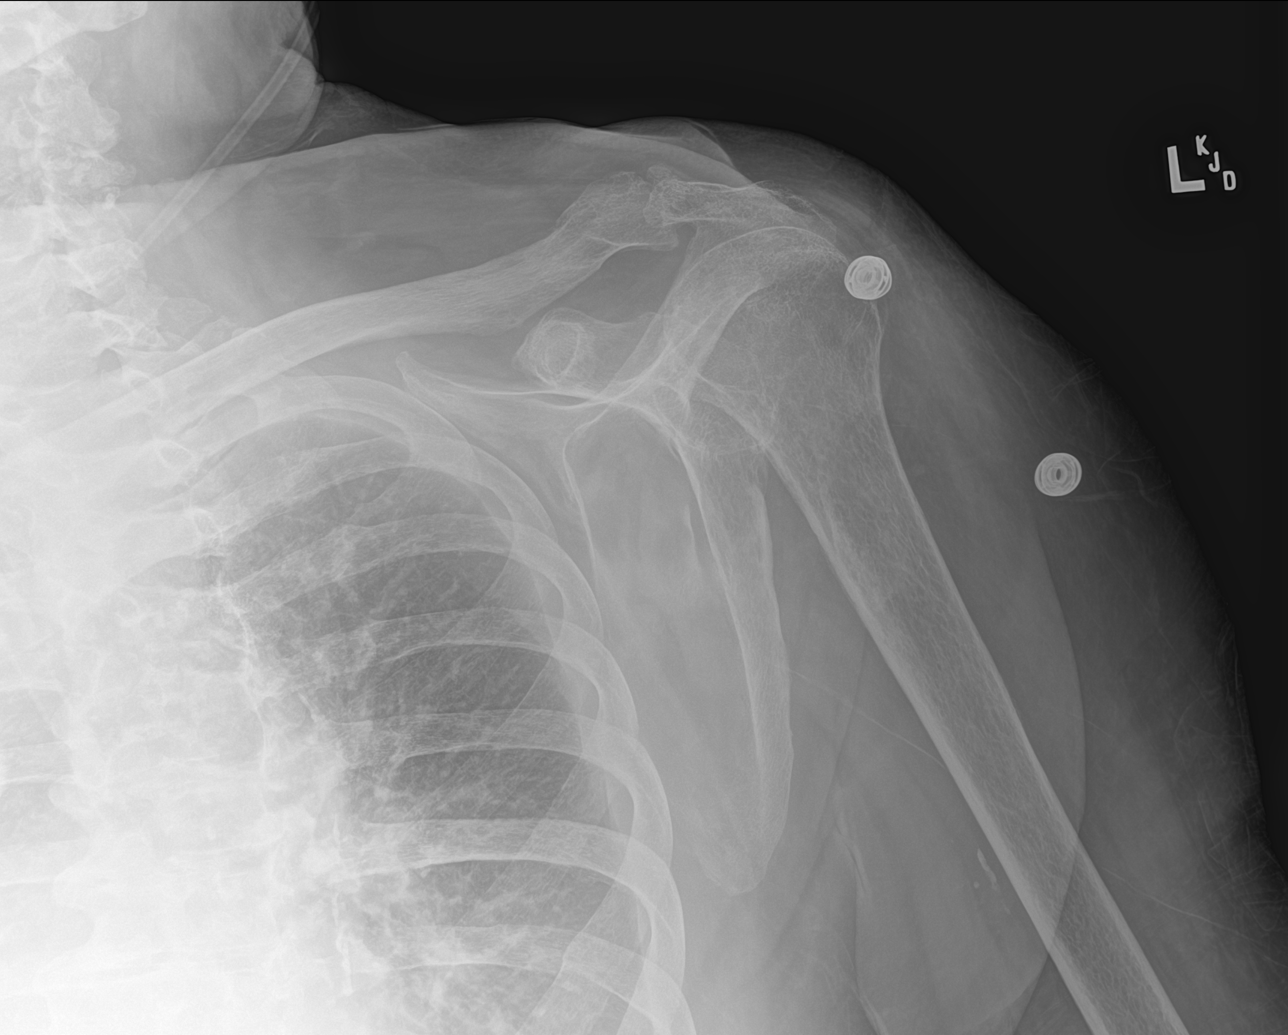

[shoulder ap (2 of 2)]
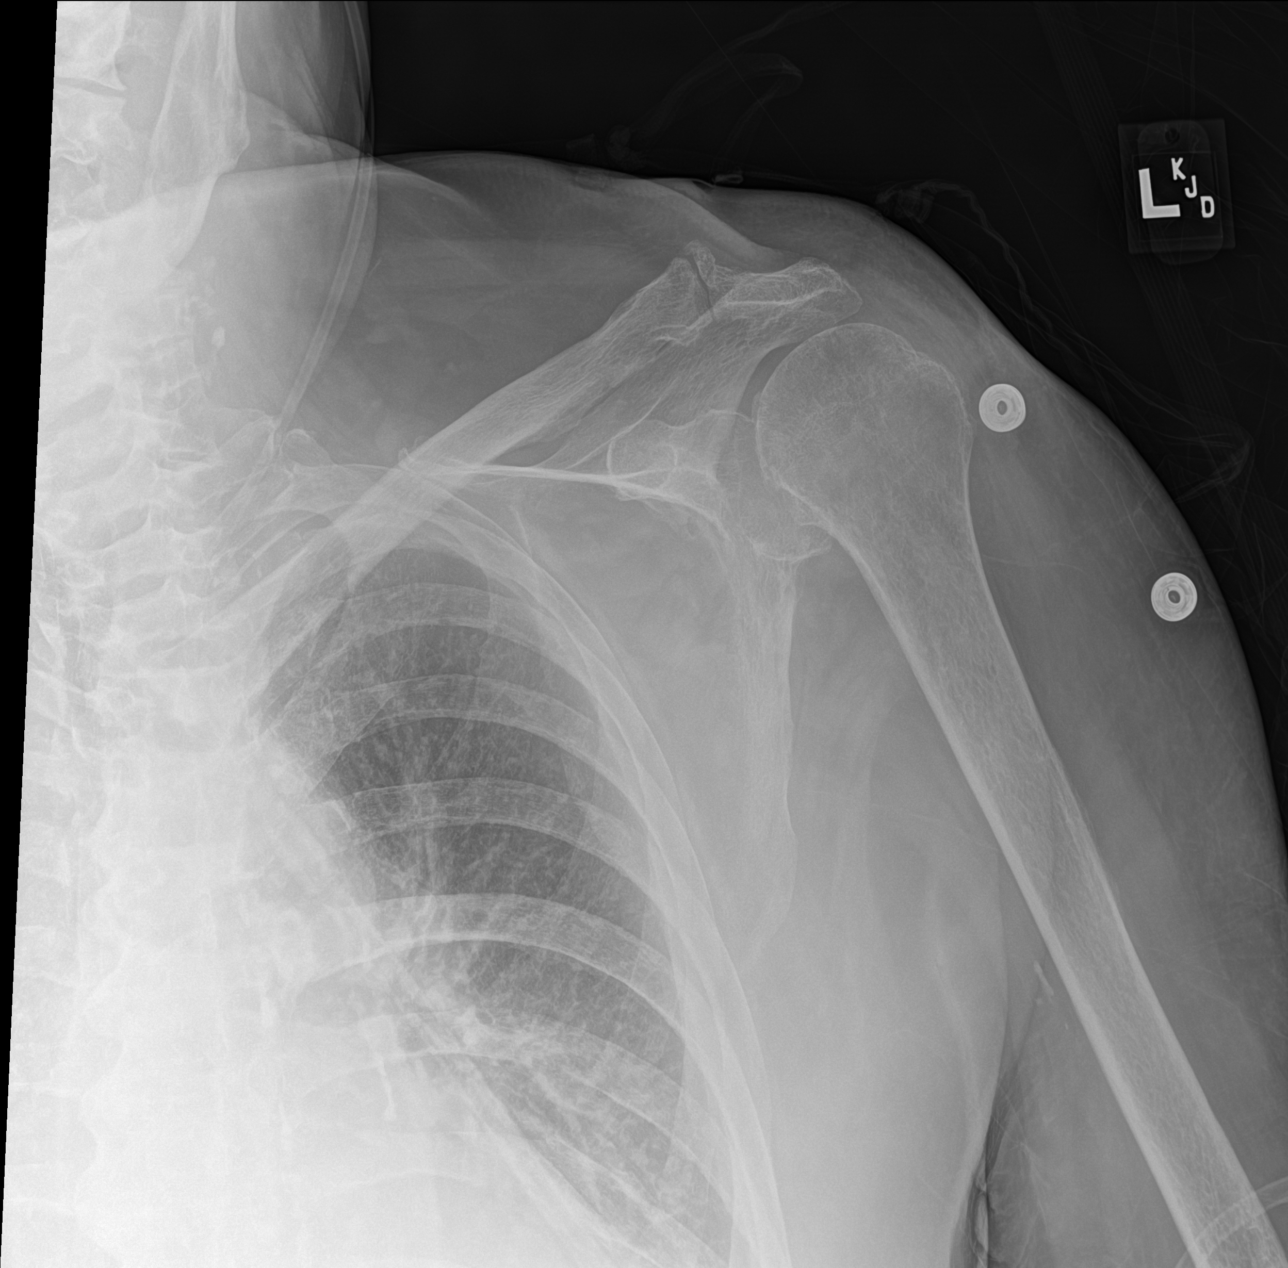

[3 of 3 positions shown; findings below may reference images not displayed]

FINDINGS: No evidence of fracture. No gross dislocation, glenohumeral
alignment is difficult to accurately assess on provided views. There
is acromioclavicular degenerative change with spurring. Inferior
glenohumeral spurring. Slight cortical irregularity of the humeral
head appears chronic. No evidence of avascular necrosis or bony
destruction. No soft tissue calcifications.
IMPRESSION: 1. No acute abnormality.
2. Acromioclavicular and glenohumeral degenerative change.
